# Patient Record
Sex: Male | Born: 1955 | Race: White | Hispanic: No | Marital: Married | Smoking: Former smoker
Health system: Southern US, Community
[De-identification: ages and names within clinical notes are randomized; demographics above are authoritative.]

## PROBLEM LIST (undated history)

## (undated) DIAGNOSIS — E785 Hyperlipidemia, unspecified: Secondary | ICD-10-CM

## (undated) DIAGNOSIS — G709 Myoneural disorder, unspecified: Secondary | ICD-10-CM

## (undated) DIAGNOSIS — T8859XA Other complications of anesthesia, initial encounter: Secondary | ICD-10-CM

## (undated) DIAGNOSIS — N182 Chronic kidney disease, stage 2 (mild): Secondary | ICD-10-CM

## (undated) DIAGNOSIS — R112 Nausea with vomiting, unspecified: Secondary | ICD-10-CM

## (undated) DIAGNOSIS — E559 Vitamin D deficiency, unspecified: Secondary | ICD-10-CM

## (undated) DIAGNOSIS — IMO0001 Reserved for inherently not codable concepts without codable children: Secondary | ICD-10-CM

## (undated) DIAGNOSIS — I6529 Occlusion and stenosis of unspecified carotid artery: Secondary | ICD-10-CM

## (undated) DIAGNOSIS — K219 Gastro-esophageal reflux disease without esophagitis: Secondary | ICD-10-CM

## (undated) DIAGNOSIS — D649 Anemia, unspecified: Secondary | ICD-10-CM

## (undated) DIAGNOSIS — G629 Polyneuropathy, unspecified: Secondary | ICD-10-CM

## (undated) DIAGNOSIS — I872 Venous insufficiency (chronic) (peripheral): Secondary | ICD-10-CM

## (undated) DIAGNOSIS — I951 Orthostatic hypotension: Secondary | ICD-10-CM

## (undated) DIAGNOSIS — F419 Anxiety disorder, unspecified: Secondary | ICD-10-CM

## (undated) DIAGNOSIS — E669 Obesity, unspecified: Secondary | ICD-10-CM

## (undated) DIAGNOSIS — I2510 Atherosclerotic heart disease of native coronary artery without angina pectoris: Secondary | ICD-10-CM

## (undated) DIAGNOSIS — E039 Hypothyroidism, unspecified: Secondary | ICD-10-CM

## (undated) DIAGNOSIS — F32A Depression, unspecified: Secondary | ICD-10-CM

## (undated) DIAGNOSIS — G4730 Sleep apnea, unspecified: Secondary | ICD-10-CM

## (undated) DIAGNOSIS — Z9889 Other specified postprocedural states: Secondary | ICD-10-CM

## (undated) DIAGNOSIS — I209 Angina pectoris, unspecified: Secondary | ICD-10-CM

## (undated) DIAGNOSIS — I10 Essential (primary) hypertension: Secondary | ICD-10-CM

## (undated) DIAGNOSIS — I272 Pulmonary hypertension, unspecified: Secondary | ICD-10-CM

## (undated) DIAGNOSIS — E1169 Type 2 diabetes mellitus with other specified complication: Secondary | ICD-10-CM

## (undated) DIAGNOSIS — N2889 Other specified disorders of kidney and ureter: Secondary | ICD-10-CM

## (undated) DIAGNOSIS — J449 Chronic obstructive pulmonary disease, unspecified: Secondary | ICD-10-CM

## (undated) DIAGNOSIS — F329 Major depressive disorder, single episode, unspecified: Secondary | ICD-10-CM

## (undated) DIAGNOSIS — T4145XA Adverse effect of unspecified anesthetic, initial encounter: Secondary | ICD-10-CM

## (undated) DIAGNOSIS — Z9861 Coronary angioplasty status: Secondary | ICD-10-CM

## (undated) HISTORY — DX: Obesity, unspecified: E66.9

## (undated) HISTORY — DX: Venous insufficiency (chronic) (peripheral): I87.2

## (undated) HISTORY — DX: Chronic obstructive pulmonary disease, unspecified: J44.9

## (undated) HISTORY — DX: Atherosclerotic heart disease of native coronary artery without angina pectoris: I25.10

## (undated) HISTORY — DX: Occlusion and stenosis of unspecified carotid artery: I65.29

## (undated) HISTORY — PX: HERNIA REPAIR: SHX51

## (undated) HISTORY — PX: BACK SURGERY: SHX140

## (undated) HISTORY — DX: Essential (primary) hypertension: I10

## (undated) HISTORY — DX: Atherosclerotic heart disease of native coronary artery without angina pectoris: Z98.61

## (undated) HISTORY — DX: Anxiety disorder, unspecified: F41.9

## (undated) HISTORY — PX: SPINE SURGERY: SHX786

## (undated) HISTORY — PX: OTHER SURGICAL HISTORY: SHX169

## (undated) HISTORY — DX: Reserved for inherently not codable concepts without codable children: IMO0001

---

## 1998-09-07 ENCOUNTER — Encounter: Admission: RE | Admit: 1998-09-07 | Discharge: 1998-12-06 | Payer: Self-pay | Admitting: Emergency Medicine

## 2001-12-11 HISTORY — PX: CARDIAC CATHETERIZATION: SHX172

## 2001-12-21 ENCOUNTER — Encounter: Payer: Self-pay | Admitting: *Deleted

## 2001-12-21 ENCOUNTER — Ambulatory Visit (HOSPITAL_COMMUNITY): Admission: RE | Admit: 2001-12-21 | Discharge: 2001-12-22 | Payer: Self-pay | Admitting: *Deleted

## 2001-12-21 HISTORY — PX: CORONARY ANGIOPLASTY: SHX604

## 2002-03-31 ENCOUNTER — Inpatient Hospital Stay (HOSPITAL_COMMUNITY): Admission: RE | Admit: 2002-03-31 | Discharge: 2002-04-02 | Payer: Self-pay | Admitting: Neurosurgery

## 2002-03-31 ENCOUNTER — Encounter: Payer: Self-pay | Admitting: Neurosurgery

## 2003-02-19 HISTORY — PX: CAROTID ENDARTERECTOMY: SUR193

## 2003-03-23 ENCOUNTER — Inpatient Hospital Stay (HOSPITAL_COMMUNITY): Admission: RE | Admit: 2003-03-23 | Discharge: 2003-03-24 | Payer: Self-pay | Admitting: Vascular Surgery

## 2003-03-23 ENCOUNTER — Encounter (INDEPENDENT_AMBULATORY_CARE_PROVIDER_SITE_OTHER): Payer: Self-pay | Admitting: *Deleted

## 2003-06-27 ENCOUNTER — Ambulatory Visit (HOSPITAL_COMMUNITY): Admission: RE | Admit: 2003-06-27 | Discharge: 2003-06-27 | Payer: Self-pay | Admitting: Neurosurgery

## 2003-06-29 ENCOUNTER — Ambulatory Visit (HOSPITAL_COMMUNITY): Admission: RE | Admit: 2003-06-29 | Discharge: 2003-06-29 | Payer: Self-pay | Admitting: Neurosurgery

## 2003-07-27 ENCOUNTER — Encounter: Admission: RE | Admit: 2003-07-27 | Discharge: 2003-07-27 | Payer: Self-pay | Admitting: Neurosurgery

## 2003-08-10 ENCOUNTER — Encounter: Admission: RE | Admit: 2003-08-10 | Discharge: 2003-08-10 | Payer: Self-pay | Admitting: Neurosurgery

## 2005-07-11 ENCOUNTER — Encounter: Admission: RE | Admit: 2005-07-11 | Discharge: 2005-07-11 | Payer: Self-pay | Admitting: Neurosurgery

## 2005-07-31 ENCOUNTER — Encounter: Admission: RE | Admit: 2005-07-31 | Discharge: 2005-07-31 | Payer: Self-pay | Admitting: Neurosurgery

## 2007-02-19 HISTORY — PX: UMBILICAL HERNIA REPAIR: SHX196

## 2007-06-17 ENCOUNTER — Ambulatory Visit: Payer: Self-pay | Admitting: Vascular Surgery

## 2007-08-27 ENCOUNTER — Ambulatory Visit (HOSPITAL_COMMUNITY): Admission: RE | Admit: 2007-08-27 | Discharge: 2007-08-27 | Payer: Self-pay | Admitting: General Surgery

## 2008-06-01 ENCOUNTER — Ambulatory Visit: Payer: Self-pay | Admitting: Vascular Surgery

## 2008-10-14 ENCOUNTER — Ambulatory Visit (HOSPITAL_COMMUNITY): Admission: RE | Admit: 2008-10-14 | Discharge: 2008-10-15 | Payer: Self-pay | Admitting: Neurosurgery

## 2009-01-02 ENCOUNTER — Inpatient Hospital Stay (HOSPITAL_COMMUNITY): Admission: RE | Admit: 2009-01-02 | Discharge: 2009-01-04 | Payer: Self-pay | Admitting: Neurosurgery

## 2009-06-01 ENCOUNTER — Ambulatory Visit: Payer: Self-pay | Admitting: Vascular Surgery

## 2010-05-23 LAB — CBC
HCT: 41.7 % (ref 39.0–52.0)
WBC: 4.7 10*3/uL (ref 4.0–10.5)

## 2010-05-23 LAB — GLUCOSE, CAPILLARY
Glucose-Capillary: 150 mg/dL — ABNORMAL HIGH (ref 70–99)
Glucose-Capillary: 163 mg/dL — ABNORMAL HIGH (ref 70–99)
Glucose-Capillary: 184 mg/dL — ABNORMAL HIGH (ref 70–99)
Glucose-Capillary: 194 mg/dL — ABNORMAL HIGH (ref 70–99)
Glucose-Capillary: 209 mg/dL — ABNORMAL HIGH (ref 70–99)
Glucose-Capillary: 240 mg/dL — ABNORMAL HIGH (ref 70–99)
Glucose-Capillary: 243 mg/dL — ABNORMAL HIGH (ref 70–99)
Glucose-Capillary: 225 mg/dL — ABNORMAL HIGH (ref 70–99)
Glucose-Capillary: 242 mg/dL — ABNORMAL HIGH (ref 70–99)

## 2010-05-23 LAB — TYPE AND SCREEN
ABO/RH(D): A POS
Antibody Screen: NEGATIVE

## 2010-05-23 LAB — BASIC METABOLIC PANEL
GFR calc non Af Amer: >60 mL/min (ref >60)
Glucose, Bld: 268 mg/dL — ABNORMAL HIGH (ref 70–99)

## 2010-05-24 ENCOUNTER — Other Ambulatory Visit: Payer: Self-pay

## 2010-05-26 LAB — GLUCOSE, CAPILLARY
Glucose-Capillary: 188 mg/dL — ABNORMAL HIGH (ref 70–99)
Glucose-Capillary: 208 mg/dL — ABNORMAL HIGH (ref 70–99)
Glucose-Capillary: 280 mg/dL — ABNORMAL HIGH (ref 70–99)

## 2010-05-26 LAB — COMPREHENSIVE METABOLIC PANEL
BUN: 19 mg/dL (ref 6–23)
CO2: 27 mEq/L (ref 19–32)
Calcium: 11.2 mg/dL — ABNORMAL HIGH (ref 8.4–10.5)
Creatinine, Ser: 1.08 mg/dL (ref 0.4–1.5)
GFR calc Af Amer: >60 mL/min (ref >60)
GFR calc non Af Amer: >60 mL/min (ref >60)
Glucose, Bld: 181 mg/dL — ABNORMAL HIGH (ref 70–99)

## 2010-05-26 LAB — CBC
Hemoglobin: 15.6 g/dL (ref 13.0–17.0)
MCHC: 35.1 g/dL (ref 30.0–36.0)
MCV: 91.1 fL (ref 78.0–100.0)
RBC: 4.88 MIL/uL (ref 4.22–5.81)

## 2010-06-05 ENCOUNTER — Other Ambulatory Visit: Payer: Self-pay

## 2010-07-03 NOTE — Op Note (Signed)
Harold [name], Harold [name]                 ACCOUNT NO.:  [id number]   MEDICAL RECORD NO.:  [id number]          PATIENT TYPE:  OIB   LOCATION:  3528                         FACILITY:  [facility]   PHYSICIAN:  Ashok Pall, M.D.     DATE OF BIRTH:  [birth year]   DATE OF PROCEDURE:  10/14/2008  DATE OF DISCHARGE:                               OPERATIVE REPORT   PREOPERATIVE DIAGNOSES:  1. Displaced disk, left L5-S1.  2. Left S1 radiculopathy.   POSTOPERATIVE DIAGNOSES:  1. Displaced disk, left L5-S1.  2. Left S1 radiculopathy.   PROCEDURE:  Left L5-S1 diskectomy with hemilaminectomy using  microdissection.   COMPLICATIONS:  None.   SURGEON:  Ashok Pall, MD   ASSISTANT:  Hosie Spangle, MD   ANESTHESIA:  General endotracheal.   INDICATIONS:  Harold [name] is a 55 year old who has had a several-month  history of severe pain in his left lower extremity.  MRI shows a fairly  large disk herniation at L5-S1 eccentric to the left side.  After going  through conservative treatment without relief, he has decided to opt for  operative decompression.   OPERATIVE NOTE:  Ms. [name] was brought to the operating room,  intubated, and placed under a general anesthetic without difficulty.  He  was rolled prone onto a Wilson frame and all pressure points were  properly padded.  His back was prepped, and he was draped in a sterile  fashion.  I infiltrated into the lower third of his lumbar incision.  I  opened that and took that down through the thoracolumbar fascia and  scar.  I then exposed 2 lamina and they turned out to be the L5-S1  lamina.  I then used a high-speed drill to perform a semi-  hemilaminectomy of L5 on the left side.  I removed the ligamentum flavum  and exposed the thecal sac between L5 and S1.  I brought the microscope  into the operative field and with Dr. Donnella Bi assistance, we  retracted thecal sac medially and encountered what was a very large disk  herniation.  I opened the  disk with a #15 blade and then Dr. Sherwood Gambler  removed it in piecemeal fashion initially.  I then used a hook to free  more loose disk material.  That was also removed.  Then eventually the  disk space which was deteriorated I removed.  I entered by opening with  a #15 blade and using pituitary rongeur along with some Epstein  curettes.  I removed all the loose disk material.  I inspected  underneath the S1 root on the left side and was not able to free any  more disk material nor did I believe there is any disk material  remaining.  I then irrigated.  I then started to close the wound.  Prior  to closure, I infiltrated another 20  mL of 0.5% lidocaine in paraspinous musculature and subcutaneous tissue.  I then closed the wound in layered fashion using Vicryl sutures to  reapproximate thoracolumbar, subcutaneous, subcuticular layers.  I used  Dermabond for sterile dressing.  ______________________________  Ashok Pall, M.D.     KC/MEDQ  D:  10/14/2008  T:  10/15/2008  Job:  [id number]

## 2010-07-03 NOTE — Procedures (Signed)
CAROTID DUPLEX EXAM   INDICATION:  Follow-up evaluation of known carotid artery disease.   HISTORY:  Diabetes:  Yes.  Cardiac:  PTCA in 11/2001.  Hypertension:  No.  Smoking:  No.  Previous Surgery:  Right carotid endarterectomy on 03/23/03 by Dr.  Oneida Alar.  CV History:  No.  Amaurosis Fugax No, Paresthesias No, Hemiparesis No.                                       RIGHT             LEFT  Brachial systolic pressure:         116               120  Brachial Doppler waveforms:         WNL               WNL  Vertebral direction of flow:        Antegrade         Antegrade  DUPLEX VELOCITIES (cm/sec)  CCA peak systolic                   115               393  ECA peak systolic                   193               594  ICA peak systolic                   105               090  ICA end diastolic                   38                59  PLAQUE MORPHOLOGY:                  Homogenous        Calcified  PLAQUE AMOUNT:                      Mild              Moderate  PLAQUE LOCATION:                    BIF, ICA, ECA     BIF, ICA, ECA   IMPRESSION:  1. 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  2. 40-59% left internal carotid artery stenosis.    ___________________________________________  Jessy Oto Fields, MD   AC/MEDQ  D:  06/01/2008  T:  06/01/2008  Job:  [id number]

## 2010-07-03 NOTE — Procedures (Signed)
CAROTID DUPLEX EXAM   INDICATION:  Follow-up evaluation of known carotid artery disease.   HISTORY:  Diabetes:  Orally controlled.  Cardiac:  PTCA in October, 2003.  Hypertension:  No.  Smoking:  No.  Previous Surgery:  Right carotid endarterectomy on 03/23/03 by Dr.  Oneida Alar.  CV History:  Previous duplex on 03/07/06 revealed no right ICA stenosis,  status post endarterectomy, and 40-59% left ICA stenosis.  Patient  reports no cerebrovascular symptoms at this time.  Amaurosis Fugax No, Paresthesias No, Hemiparesis No                                       RIGHT             LEFT  Brachial systolic pressure:         110               112  Brachial Doppler waveforms:         Triphasic         Triphasic  Vertebral direction of flow:        Antegrade         Antegrade  DUPLEX VELOCITIES (cm/sec)  CCA peak systolic                   81                224  ECA peak systolic                   127               825  ICA peak systolic                   75                003  ICA end diastolic                   33                45  PLAQUE MORPHOLOGY:                  Soft              Calcified  PLAQUE AMOUNT:                      Mild              Mild-to-moderate  PLAQUE LOCATION:                    Proximal ICA      Proximal ICA   IMPRESSION:  1. 20-39% right internal carotid artery stenosis, status post      endarterectomy.  2. 40-59% left internal carotid artery stenosis.  3. No significant change from previous study performed 03/07/06.   ___________________________________________  Harold Oto. Fields, MD   MC/MEDQ  D:  06/17/2007  T:  06/17/2007  Job:  [id number]

## 2010-07-06 NOTE — Op Note (Signed)
NAME:  Harold [name], Harold [name]                           ACCOUNT NO.:  [id number]   MEDICAL RECORD NO.:  [id number]                   PATIENT TYPE:  INP   LOCATION:  3309                                 FACILITY:  [facility]   PHYSICIAN:  Jessy Oto. Fields, MD               DATE OF BIRTH:  [birth year]   DATE OF PROCEDURE:  03/23/2003  DATE OF DISCHARGE:                                 OPERATIVE REPORT   PROCEDURE PERFORMED:  Right carotid endarterectomy.   PREOPERATIVE DIAGNOSIS:  Right carotid endarterectomy.   POSTOPERATIVE DIAGNOSIS:  Severe right internal carotid artery stenosis.   SURGEON:  Jessy Oto. Fields, MD   ASSISTANT:  Leta Baptist, PA   ANESTHESIA:  General.   INDICATIONS FOR PROCEDURE:  The patient is a 55 year old male with a history  of an asymptomatic severe greater than 90% right internal carotid artery  stenosis.   OPERATIVE FINDINGS:  1. Greater than 90% right internal carotid artery stenosis.  2. Dacron patch angioplasty.   DESCRIPTION OF PROCEDURE:  After obtaining informed consent, the patient was  taken to the operating room.  The patient was placed in supine position on  the operating table.  After induction of general anesthesia and endotracheal  intubation, a Foley catheter was placed.  Next, the patient's  entire right neck and chest were prepped and draped in the usual sterile  fashion.  An oblique incision was made across the anterior border of the  right sternocleidomastoid muscle.  This incision was carried down through  the subcutaneous tissues and down through the platysma using cautery  dissection.  The dissection proceeded along the anterior border of the  sternocleidomastoid muscle and this muscle and the internal jugular vein  were reflected laterally.  Next, the dissection proceeded down onto the  common carotid artery.  The carotid artery was dissected free  circumferentially and controlled with an umbilical tape.  Vagus was  identified and  protected from harm's way.  The ansa cervicalis was  identified crossing over the common carotid artery.  Next the dissection  proceeded up the artery and the internal carotid artery and external carotid  arteries were both dissected free circumferentially and controlled with  vessel loops.  The superior thyroid artery was also dissected free  circumferentially and controlled with the vessel loop.  The hypoglossal  nerve was identified at the superior most portion of the dissection and this  was protected from harm's way.  Next, the patient was given 5000 units of  intravenous heparin.  The internal carotid artery, external carotid artery,  superior thyroid artery and common carotid arteries were then clamped.  An  11 blade was used to make a longitudinal arteriotomy in the common carotid  artery.  Potts scissors were then used to extend the arteriotomy up into the  internal carotid artery and through the area of stenosis.  The area of  stenosis was heavily  calcified and greater than 90%.  Next, a 35 [place]  shunt was brought up in the operative field.  This was placed first in the  distal internal carotid artery, allowed to backbleed and then down into the  common carotid artery.  This shunt was then opened with restoration of flow  to the brain after approximately four minutes time.  Next, the  endarterectomy was begun in a suitable plane.  And the endarterectomy was  carried up to the distal internal carotid artery where a suitable end point  was obtained.  The endarterectomy was then carried up into the external and  superior thyroid arteries by eversion technique.  Plaque was then removed  and sent as a specimen.  Next, all loose debris was removed from the carotid  artery and this was thoroughly flushed with heparinized saline.  The end  point had a slight step off and was raising some with heparinized saline  flushing.  Therefore several 7-0 Prolene tacking sutures were placed on  the  end point to secure this.  Next, a Dacron patch was brought up into the  operative field and this was sewn at patch angioplasty onto the carotid  artery.  Just prior to completion of the anastomosis, the shunt was clamped  and removed.  The internal and external carotid arteries were thoroughly  back bled. These were then reclamped and the common carotid artery was  thoroughly fore bled.  These were all clamped and the patch was completed.  At completion of the patch the external carotid artery and superior thyroid  arteries were allowed to back bleed initially and then the common carotid  artery clamp was removed and the common carotid artery clamp was removed and  allowed to flush up the external carotid artery.  After approximately five  cardiac cycles, the internal carotid artery was reopened restoring blood  flow to the brain after approximately four minutes of ischemic time from  removal of the shunt.  Next, hemostasis was obtained.  Doppler was used to  inspect the distal internal carotid artery, the external carotid artery and  the common carotid artery and these all had normal Doppler signals.  Next,  the platysma was reapproximated using a running 3-0 Vicryl suture.  The skin  was then closed with a 4-0 Vicryl subcuticular stitch.  The patient  tolerated the procedure well.  There were no complications.  Sponge, needle  and instrument counts were correct at the end of the case.  The patient was  awakened in the operating room and had normal neurologic function with upper  and lower extremities symmetric motor strength.  The patient was transferred  to the recovery room in stable condition.                                               Jessy Oto. Fields, MD    CEF/MEDQ  D:  03/23/2003  T:  03/24/2003  Job:  [id number]

## 2010-07-06 NOTE — Cardiovascular Report (Signed)
NAME:  Harold [name], Harold [name]                           ACCOUNT NO.:  [id number]   MEDICAL RECORD NO.:  [id number]                   PATIENT TYPE:  OIB   LOCATION:  NA                                   FACILITY:  [facility]   PHYSICIAN:  Octavia Heir, M.D.             DATE OF BIRTH:  [birth year]   DATE OF PROCEDURE:  12/21/2001  DATE OF DISCHARGE:                              CARDIAC CATHETERIZATION   PROCEDURE:  1. Coronary angiography.  2. Left circumflex--mid; percutaneous transluminal coronary balloon     angioplasty.  3. Obtuse marginal #2--mid; unsuccessful percutaneous transluminal coronary     balloon angioplasty.  4. Left anterior descending artery--apical; unsuccessful percutaneous     transluminal coronary balloon angioplasty.   COMPLICATIONS:  None.   INDICATIONS FOR PROCEDURE:  The patient is a 55 year old male patient of Dr.  Nena Jordan with a history of noninsulin-dependent diabetes mellitus, a  history of tobacco abuse, who recently underwent a Cardiolite scan revealing  lateral wall ischemia.  He subsequently underwent a cardiac catheterization  on December 11, 2001, revealing 99% apical LAD lesion with a 70-80% mid AV  groove lesion and a subtotaled second OM.  He is now brought for staged  intervention of his OM.   DESCRIPTION OF PROCEDURE:  After giving informed written consent, the  patient was brought to the cardiac catheterization laboratory where his  right and left groin were shaved, prepped, and draped in the usual sterile  fashion.  ECG monitoring was established.  Using the modified Seldinger  technique, a #7 French arterial sheath was inserted in the right femoral  artery.  Next, a #7 [place] Voda catheter 3.5 with sideholes was then  coaxially engaged in the left coronary ostium.  Selective angiogram was then  performed.  Next, a 0.014 Forte guidewire was advanced out of the guiding  catheter and positioned in the proximal portion of the second OM.   Multiple  attempts were then made to cross the total occlusion; however, this was  unsuccessful.  This wire was then exchanged for a 0.014 PT Graphix wire;  however, we still were unsuccessful in crossing the lesion.  We then did try  a [facility] wire which was also unsuccessful in crossing the second  OM lesion.  We then attempted to cross the apical LAD lesion with the Forte  guidewire; however, this was also unsuccessful.  We ultimately did rewire  the AV groove circumflex and positioned in the third distal OM without  difficulty.  We then took a Maverick 2.0 x 20-mm balloon into the distal AV  groove and performed approximately six inflations throughout the distal and  mid AV groove to a maximum of approximately 12 atmospheres for a total of 7  minutes.  Followup angiogram revealed less than 10% residual stenosis in the  distal AV groove with approximately 30% stenosis in the mid AV groove.  IV  Integrilin was used throughout the case.  IV doses of heparin were given to  maintain an ACT between 200 and 300.   Final orthogonal angiograms reveal approximately 30% residual stenosis in  the mid AV groove with less than 10% residual in the distal AV groove.  At  this point, we elected to conclude the procedure.  All balloons, wires, and  catheters were removed.  The hemostatic sheaths were sewn in place and the  patient was transferred back to the ward in stable condition.   CONCLUSION:  1. Successful percutaneous transluminal coronary balloon angioplasty of the     distal and mid AV groove circumflex lesions.  2. Unsuccessful percutaneous transluminal coronary angioplasty of the second     obtuse marginal total occlusion.  3.     Unsuccessful percutaneous transluminal coronary angioplasty of the apical     left anterior descending artery total occlusion.  4. Adjunct use of Integrilin infusion.                                               Octavia Heir, M.D.     RHM/MEDQ  D:  12/21/2001  T:  12/21/2001  Job:  [id number]   cc:   Richardson Landry A. Everlene Farrier, M.D.  [address]  [place]  [place] [address]  Fax: [phone]

## 2010-07-06 NOTE — Discharge Summary (Signed)
NAME:  Harold [name], Harold [name]                           ACCOUNT NO.:  [id number]   MEDICAL RECORD NO.:  [id number]                   PATIENT TYPE:  INP   LOCATION:  3309                                 FACILITY:  [facility]   PHYSICIAN:  Jessy Oto. Fields, MD               DATE OF BIRTH:  02/17/1956   DATE OF ADMISSION:  03/23/2003  DATE OF DISCHARGE:  03/24/2003                                 DISCHARGE SUMMARY   DATE OF SURGERY:  March 23, 2003.   ADMITTING DIAGNOSIS:  Right carotid artery stenosis.   PAST MEDICAL HISTORY:  1. Coronary artery disease, status post angioplasty October 2003.  2. Hypertension.  3. Hyperlipidemia.  4. Diabetes mellitus, type 2 diagnosed 7 years ago.   SURGICAL HISTORY:  Includes extensive lumbar surgery of L4-L5 in February  2004.   ALLERGIES:  The patient is allergic to [facility] it causes rash and itching.   DISCHARGE DIAGNOSIS:  Right carotid artery stenosis, status post right  carotid endarterectomy.   BRIEF HISTORY:  Harold [name] is a 55 year old Caucasian male.  He was referred  to Dr. Oneida Alar for evaluation of an asymptomatic carotid bruit.  On February 23, 2003, Harold [name] underwent a carotid duplex exam at the [facility] office.  The  impression was of an 80-99% right internal carotid artery stenosis.  There  was no left internal carotid artery stenosis.  Harold [name] was evaluated by  Dr. Oneida Alar in the office on January 21.  After examination, the patient  reviewed the available records.  Dr. Oneida Alar felt that Harold [name] would  benefit from a right carotid endarterectomy for stroke prophylaxis.  The  procedure, risks and benefits of carotid endarterectomy were discussed and  Harold [name] agreed to proceed with surgery. He was scheduled as an elective  admission for February 2.   HOSPITAL COURSE:  On March 23, 2003 Harold [name] was electively admitted to  [facility] under the care of Dr. Murrell Converse.  He underwent the  following surgical procedure:   Right carotid endarterectomy with Dacron  patch angioplasty.  He tolerated this procedure well, transferring in stable  condition to the PACU.  He was extubated immediately following surgery.  He  awoke from anesthesia neurologically intact.  In the immediate postoperative  period he did require dopamine for blood pressure support, however, by the  early evening hours dopamine had been discontinued and his blood pressure  remained stable.  By the morning of postoperative day #1 Harold [name] was  without complaint.  Some postanesthesia nausea was resolved.  His vital  signs were stable.  He remained neurologically intact.  His right neck  incision was clean and dry.  There was no hematoma noted.  Harold [name] was  ready for discharge home the morning of March 24 2003.   CONDITION ON DISCHARGE:  Improved.   DISCHARGE MEDICATIONS:  1. He has been instructed  to resume his home medications of aspirin 325 mg     daily.  2. Mavik 2 mg daily.  3. Toprol XL 25 mg daily.  4. Lipitor 40 mg daily.  5. Avandamet 500 mg b.i.d.  6. Nexium 40 mg daily.  7. For pain management he can be on Tylox 1-2 p.o. q.4h. p.r.n. for moderate-     to-severe pain or Tylenol 325 mg 1-2 p.o. q.4h. p.r.n. for mild pain.   DISCHARGE INSTRUCTIONS:  1. Activity:  He has been asked to refrain from any driving for 2 weeks.  He     is also instructed to continue his breathing exercises and daily walking.  2. His diet should continue to be a heart healthy diet.  3. Wound Care:  He may begin showering starting March 25, 2003.  4. Follow up:  Dr. Oneida Alar would like to see him in CVTS follow up in 2     weeks.  The office will call for the date and time for that appointment.      Mardene Celeste, N.P.                  Jessy Oto. Fields, MD    CTK/MEDQ  D:  03/24/2003  T:  03/25/2003  Job:  [id number]   cc:   Jessy Oto. [place], [place], [place] [address]   Lina Sayre. Everlene Farrier, M.D.  [address]   [place]  [place] [address]  Fax: [phone]

## 2010-07-06 NOTE — Discharge Summary (Signed)
NAME:  Harold [name], Harold [name]                           ACCOUNT NO.:  [id number]   MEDICAL RECORD NO.:  [id number]                   PATIENT TYPE:  OIB   LOCATION:  9629                                 FACILITY:  [facility]   PHYSICIAN:  Octavia Heir, M.D.             DATE OF BIRTH:  November 04, 1955   DATE OF ADMISSION:  12/21/2001  DATE OF DISCHARGE:  12/22/2001                                 DISCHARGE SUMMARY   ADMISSION DIAGNOSES:  1. Coronary artery disease.     a. Status post outpatient cardiac catheterization on December 11, 2001,        revealing significant three-vessel coronary artery disease and normal        left ventricular function.     b. Status post exercise Cardiolite on September 16, 2001, revealing inferior        and inferolateral wall ischemia and ejection fraction of 60%.  2. Noninsulin-dependent diabetes mellitus.  3. Chronic tobacco use, ongoing.  4. Hypertension.   DISCHARGE DIAGNOSES:  1. Coronary artery disease.     a. Status post outpatient cardiac catheterization on December 11, 2001,        revealing significant three-vessel coronary artery disease and normal        left ventricular function.     b. Status post exercise Cardiolite on September 16, 2001, revealing inferior        and inferolateral wall ischemia and ejection fraction of 60%.  2. Noninsulin-dependent diabetes mellitus.  3. Chronic tobacco use, ongoing.  4. Hypertension.  5. Status post cardiac catheterization on December 21, 2001, with     percutaneous transluminal coronary angiography to the distal left     circumflex.   HISTORY OF PRESENT ILLNESS:  The patient is a 55 year old white male who had  a history of noninsulin-dependent diabetes mellitus and chronic tobacco use.  He underwent a treadmill Cardiolite which revealed an anterior wall scar and  inferolateral ischemia.  He then underwent outpatient cardiac  catheterization by Octavia Heir, M.D., on December 11, 2001, which  revealed a 99% apical  LAD, 70% mid AV stenosis, 99% proximal mid OM2 lesion,  and 70% mid PDA lesion.  At that time, it was felt that we should probably  plan for staged percutaneous intervention.   He came back to the office for follow-up on December 15, 2001, at which time  his groin site was healing well.  He had experienced some mild chest  discomfort, but had used no nitroglycerin and had no angina at rest.   At that time, medications were adjusted and he was to hold Glucophage in  preparation for catheterization.  As well, we added Plavix until his  catheterization and started him on Imdur 30 mg.  He will be scheduled for  cardiac catheterization and intervention.   HOSPITAL COURSE:  On December 21, 2001, the patient underwent cardiac  catheterization by Sherren Kerns.  Tami Ribas, M.D.  He was unable to cross the OM2  and apical LAD.  However, he did perform successful PTCA to the distal left  circumflex.  At that time, he planned to continue with Integrilin for 18  hours post procedure and would place for discharge home in the morning if  stable.   On the morning of December 22, 2001, the patient remained stable.  He had no  further chest pain and no complaint.  His right groin site is well healed  with no hematoma and no bleeding.  Good peripheral pulses.  At this time, he  was seen and evaluated by Octavia Heir, M.D., who deemed him stable for  discharge home.   DISCHARGE MEDICATIONS:  He will continue the current medications with no  change.   FOLLOW-UP:  He will follow up in the office at the end of this week as he  does have papers to complete for return to work.   HOSPITAL CONSULTS:  None.   HOSPITAL PROCEDURES:  Cardiac catheterization on December 21, 2001, by Octavia Heir, M.D.  Please see his dictated report for details.  This is not  available.  He was unable to cross the OM2 or apical LAD.  He did perform  successful PTCA to the distal left circumflex.  Again, see his dictated   report for further details as there is residual disease.  He planned to  continue Integrilin for 18 hours post procedure and planned for discharge  home the following morning if stable.  The patient tolerated the procedure  well without complications.   LABORATORY DATA:  Preprocedure labs had been ordered as an outpatient and  were reviewed and stable.  Post procedure on December 22, 2001, the sodium  was 135, potassium 3.7, BUN 10, creatinine 1.0, glucose 203, white count  9.2, hemoglobin 15.2, hematocrit 43.0, and platelets 143.   DISCHARGE MEDICATIONS:  1. Glucophage.  Do not take today or tomorrow.  Resume on Thursday, December 24, 2001, to previous dose.  2. Mavik.  He may resume today at the previous dose of 4 mg a day.  3. Lipitor 20 mg once a day.  4. Toprol XL 25 mg once day.  5. Imdur 30 mg once a day.  6. Enteric-coated aspirin 325 mg a day.  7. Nitroglycerin 0.4 mg sublingual as directed.   SPECIAL INSTRUCTIONS:  Stop smoking.  He and his friend say that they do  plan to stop smoking together in the next couple of weeks.   ACTIVITY:  No strenuous activity, lifting greater than 5 pounds, driving, or  sexual activity for three days.   DIET:  Low-cholesterol diet.   WOUND CARE:  May gently wash the groin site with warm water and soap.  Call  [phone] for any bleeding or increased pain in the groin site.    FOLLOW-UP:  Follow up on Friday, December 25, 2001, at 2 p.m. with Cyndia Bent, N.P., in the office, at which time she will follow him up post  catheterization, as well as complete paperwork for return to work.     Mary B. Easley, P.A.-C.                   Octavia Heir, M.D.    MBE/MEDQ  D:  12/22/2001  T:  12/23/2001  Job:  [id number]   cc:   [facility]   Lina Sayre. Everlene Farrier, M.D.  [phone]  [place]  [place] [address]  Fax: [phone]

## 2010-07-06 NOTE — H&P (Signed)
NAME:  Harold [name], Harold [name]                           ACCOUNT NO.:  [id number]   MEDICAL RECORD NO.:  [id number]                   PATIENT TYPE:  INP   LOCATION:  NA                                   FACILITY:  [facility]   PHYSICIAN:  Harold Oto. Fields, MD               DATE OF BIRTH:  30-Dec-1955   DATE OF ADMISSION:  03/23/2003  DATE OF DISCHARGE:                                HISTORY & PHYSICAL   CHIEF COMPLAINT:  Carotid artery disease.   HISTORY OF PRESENT ILLNESS:  This is a 55 year old male who was referred by  Dr. Everlene [name] for evaluation of carotid artery disease.  The patient had an  asymptomatic carotid bruit and underwent a carotid ultrasound which showed  an 80 to 99% right internal carotid artery stenosis.  The patient has been  asymptomatic since then, and was seen by Dr. Oneida [name] in clinic on March 11, 2003.  Dr. Oneida [name] recommended that the patient undergo a right carotid  endarterectomy to reduce the risk of stroke.  The risks, benefits, and  alternatives to the procedure were discussed with the patient at that time  and he agreed to proceed with surgery.  The patient presents today for a  preoperative evaluation.   Currently, the patient admits to symptoms only of blurred vision which is  intermittent, and he has only experienced in the past week or two.  The  patient also does have some tenderness on his right neck with deep  palpation.  Otherwise, the patient denies any headache, nausea, vomiting,  vertigo, dizziness, numbness, tingling, history of amaurosis fugax, syncope,  near syncope, speech impairment, dysphagia, memory loss, muscle weakness, or  confusion.  The patient also denies any fevers, chills, night sweats, cough,  or cold-like symptoms.   PAST MEDICAL HISTORY:  1. Carotid artery disease as stated above.  2. Diabetes mellitus type 2 for 7 years.  3. Hyperlipidemia.  4. Coronary artery disease, status post angioplasty in October 2003.  5. Hypertension.  6.  Status post extensive back surgery of L4-5 lumbar spine in February 2004.   MEDICATIONS:  1. Aspirin 325 mg p.o. daily.  2. Lipitor 40 mg p.o. daily.  3. Avandamet 500 mg p.o. b.i.d.  4. Nexium 40 mg p.o. daily.  5. Mavik 2 mg p.o. daily.  6. Toprol 25 mg p.o. daily.   ALLERGIES:  SEPTRA causes rash and itching.   REVIEW OF SYSTEMS:  Please see HPI for significant positives and negatives.  Otherwise, the patient denies any cardiac symptoms including chest pain,  palpitations, peripheral edema.  The patient also denies any pulmonary  symptoms;  specifically, pneumonia, cough, shortness of breath, or dyspnea  on exertion.  The patient also denies any GI symptoms, including diarrhea,  constipation, nausea, vomiting, hematochezia.  The patient also denies any  GU symptoms, including dysuria, frequency, hematuria.   FAMILY HISTORY:  The patient was adopted  at birth and has no history of his  family history.   SOCIAL HISTORY:  [name] is single and has no children.  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  He has a 35 pack year history  of smoking of which he wishes to quit after the planned procedure.  The  patient denies any alcohol use.   PHYSICAL EXAMINATION:  VITAL SIGNS:  Left arm blood pressure is 132/80,  pulse is 75 and regular, respirations are 14 and unlabored, weight is 222  pounds, height is 6 feet even.  GENERAL:  This is a very pleasant 55 year old white male who is in no acute  distress and is alert and oriented x3.  HEENT:  Harold [name], AT.  PERRLA.  EOMI.  Oropharynx is pink and moist without  exudates or oral sores.  NECK:  Supple without JVD or lymphadenopathy.  There is a right harsh  carotid bruit and no evidence of left carotid bruit.  CHEST:  Symmetrical unlabored respirations.  His lungs are clear to  auscultation throughout without wheezes, rhonchi, or rales.  CARDIAC:  Regular rate and rhythm without murmurs, rubs, or gallops.  Normal  S1 and  S2.  ABDOMEN:  Obese, soft, nontender, nondistended, with good bowel sounds.  No  evidence of organomegaly or masses or bruit.  GENITOURINARY:  Deferred.  RECTAL:  Deferred.  EXTREMITIES:  No cyanosis, clubbing, edema, or ulcerations.  Temperature is  warm and dry.  Peripheral pulses are as follows:  Carotid pulses 2+  bilaterally, femoral pulses 2+ bilaterally, posterior tibial pulses 2+  bilaterally.  NEUROLOGIC:  Cranial nerves II-XII intact.  His gait is steady.  His muscle  strength is equal and appropriate throughout with 5/5 strength.   ASSESSMENT AND PLAN:  This is a pleasant 55 year old male with severe right  carotid artery stenosis.  We will continue as planned with a right carotid  endarterectomy by Dr. Oneida [name] on March 23, 2003.  Dr. Oneida [name] has seen and  evaluated this patient prior to this admission and has explained the risks  and benefits of the procedure, and the patient has agreed to continue.      Harold [name].                  Harold Oto. Fields, MD    CAF/MEDQ  D:  03/21/2003  T:  03/21/2003  Job:  [id number]   cc:   Harold [name], M.D.  [address]  [place]  [place] [address]  Fax: [phone]

## 2010-07-06 NOTE — Op Note (Signed)
NAME:  Harold [name], OZMENT                           ACCOUNT NO.:  [id number]   MEDICAL RECORD NO.:  [id number]                   PATIENT TYPE:  INP   LOCATION:  3031                                 FACILITY:  [facility]   PHYSICIAN:  Ashok Pall, M.D.                  DATE OF BIRTH:  1955/11/10   DATE OF PROCEDURE:  03/31/2002  DATE OF DISCHARGE:                                 OPERATIVE REPORT   PREOPERATIVE DIAGNOSES:  1. Displaced cyst, right L4-5.  2. Degenerative disk L4-5.  3. Lumbar radiculopathy.   POSTOPERATIVE DIAGNOSES:  1. Displaced cyst,  L4-5 right.  2. Degenerative disk disease L4-5.  3. Lumbar radiculopathy.   HISTORY:  [name] is a 55 year old gentleman who presents with severe  pain in his right lower extremity. He has a herniated disk at L4-5.  Secondary to the marked degeneration at L4-5, he did not want to take a  chance that he might need further surgery so he decided he wanted to undergo  a fusion at L4-5. He is admitted today for that procedure.   PROCEDURE:  1. Posterior lumbar interbody fusion.  2. Allograft bone used for antibody arthrodesis.  3. Posterolateral arthrodesis L4-5.  4. Pedicle screw fixation L4-5.  5. Morcellized allograft and autograft for interbody and posterolateral     arthrodesis.   COMPLICATIONS:  None.   SURGEON:  Ashok Pall, M.D.   ASSISTANT:  Marchia Meiers. Vertell Limber, M.D.   ANESTHESIA:  General endotracheal.   DESCRIPTION OF PROCEDURE:  [name] was brought to the operating room,  intubated, placed under general anesthesia without difficulty. A Foley  catheter was placed under sterile conditions. He was then rolled prone onto  body rolls and all pressure points were padded. His back was prepped and he  was draped in a sterile fashion. I made a skin incision after taking a  preoperative localizer with a #10 blade and took this down to the  thoracolumbar fascia. I then exposed the lamina of L3-4, L5 and the sacrum  bilaterally. I performed my space L4-5. I then proceed with the laminectomy  at L4-5 and diskectomy. This was done and I removed the inferior facet on  the left side and drilled away a significant portion of the inferior facet  of L4 on the right side. I was then able to perform a very good diskectomy  and removed all disk material. I used the disk space shaver provided by  [employer] and the [employer] instrumentation. I eventually placed two 11 mm  grafts into the disk space after first removing the endplates and removing  all the disk material. I placed morcellized autograft into the disk space  also. I then turned my attention to placing pedicle screws at L4-5. I  identified the pedicles at L5 on the side. I then turned my attention to the  L4 pedicle. This was markedly sclerotic  and there was some difficulty  penetrating the pedicle but with fluoroscopic guidance and AP and lateral  views, I was able to place a 40 mm 6.5 screw there as I had at L5 on the  right side. I then went to the left side and placed two screws so difficulty  first by drilling tapping and then using fluoroscopic guidance. The 40 mm  rods were placed. Dr. Vertell Limber assisted with the arthrodesis at L4-5  posterolaterally. We placed bone graft after decorticating the transverse  processes of L4-5 bilaterally. We placed this construct in some compression.  I then placed morcellized allograft into the space, it was a chromous  synthetic material. I then closed the wound in layered fashion using Vicryl  sutures. Dermabond used for a sterile dressing. The patient tolerated the  procedure well.                                               Ashok Pall, M.D.    KC/MEDQ  D:  03/31/2002  T:  03/31/2002  Job:  [id number]

## 2010-07-09 ENCOUNTER — Other Ambulatory Visit: Payer: Self-pay

## 2010-10-03 ENCOUNTER — Other Ambulatory Visit (INDEPENDENT_AMBULATORY_CARE_PROVIDER_SITE_OTHER): Payer: BC Managed Care – PPO

## 2010-10-03 DIAGNOSIS — I6529 Occlusion and stenosis of unspecified carotid artery: Secondary | ICD-10-CM

## 2010-10-19 NOTE — Procedures (Unsigned)
CAROTID DUPLEX EXAM  INDICATION:  Follow up known carotid artery disease.  HISTORY: Diabetes:  Yes. Cardiac:  PTCA, 11/2001. Hypertension:  Yes. Smoking:  No. Previous Surgery:  Right carotid endarterectomy, 03/23/2003, by Dr. Oneida Alar. CV History:  No. Amaurosis Fugax No, Paresthesias No, Hemiparesis No.                                      RIGHT             LEFT Brachial systolic pressure:         112               117 Brachial Doppler waveforms:         Normal            Normal Vertebral direction of flow:        Antegrade         Antegrade DUPLEX VELOCITIES (cm/sec) CCA peak systolic                   90                096 ECA peak systolic                   134               283 ICA peak systolic                   83                662 ICA end diastolic                   19                44 PLAQUE MORPHOLOGY:                  Homogenous        Calcific PLAQUE AMOUNT:                      Mild              Moderate PLAQUE LOCATION:                    Bifurcation, ICA, ECA               Bifurcation, ICA, ECA  IMPRESSION: 1. Right internal carotid artery velocities suggest 1% to 39%     stenosis. 2. Left internal carotid artery velocities suggest 40% to 59%     stenosis. 3. Antegrade vertebral arteries bilaterally. 4. Stable in comparison to the previous examination.  ___________________________________________ Jessy Oto Fields, MD  EM/MEDQ  D:  10/03/2010  T:  10/03/2010  Job:  [id number]

## 2010-11-15 LAB — DIFFERENTIAL
Eosinophils Absolute: 0.2
Eosinophils Relative: 3
Lymphs Abs: 2.3
Monocytes Absolute: 0.5

## 2010-11-15 LAB — CBC
HCT: 43.7
Hemoglobin: 15.3
MCV: 89.4
Platelets: 168
RDW: 12.5
WBC: 6.6

## 2010-11-15 LAB — BASIC METABOLIC PANEL
BUN: 19
Chloride: 102
Glucose, Bld: 259 — ABNORMAL HIGH
Potassium: 4.5

## 2011-03-27 ENCOUNTER — Telehealth: Payer: Self-pay

## 2011-03-27 NOTE — Telephone Encounter (Signed)
Message received at 9:35 pm; please ask pt what his BS was this am 03/28/11?  How is he feeling?  Please ask pt to tell you exactly what medications he is taking for his sugar.

## 2011-03-27 NOTE — Telephone Encounter (Signed)
.  [facility] PT IS CONCERNED WITH HIS SUGAR LEVELS   THIS AM IT WAS 274 NOON DOWN TO 71  THINKS HE MAY BE TOO LOW.  PLEASE CALL

## 2011-03-28 NOTE — Telephone Encounter (Signed)
Tried to call pt. No answer/no VM at home, work # stated "pt is at home".

## 2011-03-29 NOTE — Telephone Encounter (Signed)
Pt reports that several days last 2-3 weeks, BS will be high fasting AM, as usual, 215-274. Then early afternoon will drop to 70s and he feels "hot,shakey, lightheaded, out of it, and occasional involuntary jerks". Then it is usually around 5:00 before he can get BS back to 150 or above. He is currently taking 70 units Lantus BID and 20 units Humalog BID. He didn't know if maybe that he is using CPAP, if he doesn't need as much insulin, or if maybe needs adjusted to take more in evening and less in morning, or just less in morning. He just doesn't like it dropping so low. Yesterday BS was better - 259 fasting, then only dropped to 143 at 1:00. He had eaten some toast with breakfast and it stayed higher. D/W pt importance of spreading out carb intake during the day. Pt verbalized understanding. Pt states he will see Dr Everlene Farrier in March.

## 2011-03-30 NOTE — Telephone Encounter (Signed)
[place] please call Harold [name] and advised him to decrease his morning Humalog 2-4 units and see if this would decrease his episodes of hypoglycemia in the afternoon. If he sees he continues to have trouble with hypoglycemia he just needs to back off some on his morning insulin. Tell him to let us know up he continues to have problems thank you

## 2011-04-01 NOTE — Telephone Encounter (Signed)
Spoke with pt and gave instructions from Dr Everlene Farrier. Pt agreed.

## 2011-04-20 ENCOUNTER — Ambulatory Visit (INDEPENDENT_AMBULATORY_CARE_PROVIDER_SITE_OTHER): Payer: BC Managed Care – PPO | Admitting: Emergency Medicine

## 2011-04-20 VITALS — BP 122/70 | HR 57 | Temp 97.2°F | Resp 18 | Ht 74.5 in | Wt 290.6 lb

## 2011-04-20 DIAGNOSIS — J329 Chronic sinusitis, unspecified: Secondary | ICD-10-CM

## 2011-04-20 DIAGNOSIS — R05 Cough: Secondary | ICD-10-CM

## 2011-04-20 DIAGNOSIS — J029 Acute pharyngitis, unspecified: Secondary | ICD-10-CM

## 2011-04-20 DIAGNOSIS — J019 Acute sinusitis, unspecified: Secondary | ICD-10-CM

## 2011-04-20 LAB — POCT RAPID STREP A (OFFICE): Rapid Strep A Screen: NEGATIVE

## 2011-04-20 MED ORDER — BENZONATATE 100 MG PO CAPS: 200.0000 mg | ORAL_CAPSULE | Freq: Three times a day (TID) | ORAL | Status: AC | PRN

## 2011-04-20 MED ORDER — AZITHROMYCIN 250 MG PO TABS: ORAL_TABLET | ORAL | Status: AC

## 2011-04-20 NOTE — Progress Notes (Signed)
Patient ID: Harold [name], male   DOB: [birth year], 56 y.o.   MRN: [id number]

## 2011-04-20 NOTE — Progress Notes (Deleted)
  Subjective:    Patient ID: Harold [name], male    DOB: [birth year], 56 y.o.   MRN: [id number]  HPI    Review of Systems     Objective:   Physical Exam        Assessment & Plan:

## 2011-04-20 NOTE — Progress Notes (Signed)
Subjective:  Harold [name] is a 56 y.o. male that is here today with a chief complaint of head congestion and purulent nasal drainage and onset of illness was Wednesday afternoon when he started to have a burning stinging sensation in his nose. This was followed by a sore throat with difficulty swallowing. He has had a cough which has been productive of small amounts of thick colored mucus.  Objective:  Filed Vitals:   04/20/11 1504  BP: 122/70  Pulse: 57  Temp: 97.2 F (36.2 C)  Resp: 18    General Appearance: Alert operative male in no acute distress.  HEENT: TMs are clear nose is congested throat is red  Neck: The neck is supple without adenopathy  Chest: Clear to auscultation and  Heart: Regular rate no murmurs rubs or gout  Abdomen: Soft liver and spleen not enlarged   Results for orders placed during the hospital encounter of 01/02/09  GLUCOSE, CAPILLARY      Component Value Range   Glucose-Capillary 243 (*) 70 - 99 (mg/dL)  GLUCOSE, CAPILLARY      Component Value Range   Glucose-Capillary 240 (*) 70 - 99 (mg/dL)  GLUCOSE, CAPILLARY      Component Value Range   Glucose-Capillary 209 (*) 70 - 99 (mg/dL)  GLUCOSE, CAPILLARY      Component Value Range   Glucose-Capillary 150 (*) 70 - 99 (mg/dL)  GLUCOSE, CAPILLARY      Component Value Range   Glucose-Capillary 184 (*) 70 - 99 (mg/dL)  GLUCOSE, CAPILLARY      Component Value Range   Glucose-Capillary 163 (*) 70 - 99 (mg/dL)  GLUCOSE, CAPILLARY      Component Value Range   Glucose-Capillary 112 (*) 70 - 99 (mg/dL)  GLUCOSE, CAPILLARY      Component Value Range   Glucose-Capillary 194 (*) 70 - 99 (mg/dL)   Comment 1 Notify RN    BASIC METABOLIC PANEL      Component Value Range   Sodium 134 (*) 135 - 145 (mEq/L)   Potassium 4.7 SLIGHT HEMOLYSIS  3.5 - 5.1 (mEq/L)   Chloride 100  96 - 112 (mEq/L)   CO2 26  19 - 32 (mEq/L)   Glucose, Bld 268 (*) 70 - 99 (mg/dL)   BUN 12  6 - 23 (mg/dL)   Creatinine, Ser 1.00   0.4 - 1.5 (mg/dL)   Calcium 10.5  8.4 - 10.5 (mg/dL)   GFR calc non Af Amer >60  >60 (mL/min)   GFR calc Af Amer    >60 (mL/min)   Value: >60            The eGFR has been calculated     using the MDRD equation.     This calculation has not been     validated in all clinical     situations.     eGFR's persistently     <60 mL/min signify     possible Chronic Kidney Disease.  CBC      Component Value Range   WBC 4.7  4.0 - 10.5 (K/uL)   RBC 4.52  4.22 - 5.81 (MIL/uL)   Hemoglobin 14.7  13.0 - 17.0 (g/dL)   HCT 41.7  39.0 - 52.0 (%)   MCV 92.3  78.0 - 100.0 (fL)   MCHC 35.3  30.0 - 36.0 (g/dL)   RDW 12.9  11.5 - 15.5 (%)   Platelets 159  150 - 400 (K/uL)  GLUCOSE, CAPILLARY  Component Value Range   Glucose-Capillary 242 (*) 70 - 99 (mg/dL)   Comment 1 Notify RN     Comment 2 Documented in Chart    TYPE AND SCREEN      Component Value Range   ABO/RH(D) A POS     Antibody Screen NEG     Sample Expiration 01/05/2009    ABO/RH      Component Value Range   ABO/RH(D) A POS    GLUCOSE, CAPILLARY      Component Value Range   Glucose-Capillary 225 (*) 70 - 99 (mg/dL)  GLUCOSE, CAPILLARY      Component Value Range   Glucose-Capillary 223 (*) 70 - 99 (mg/dL)     Assessment:  Assessment is upper respiratory infection with secondary sinusitis. Patient has underlying diabetes.  Plan:  Check a strep test and if negative proceed with antibiotics and sinus care.

## 2011-04-29 ENCOUNTER — Other Ambulatory Visit: Payer: Self-pay | Admitting: Dermatology

## 2011-04-29 ENCOUNTER — Ambulatory Visit (INDEPENDENT_AMBULATORY_CARE_PROVIDER_SITE_OTHER): Payer: BC Managed Care – PPO | Admitting: Emergency Medicine

## 2011-04-29 DIAGNOSIS — G589 Mononeuropathy, unspecified: Secondary | ICD-10-CM

## 2011-04-29 DIAGNOSIS — E785 Hyperlipidemia, unspecified: Secondary | ICD-10-CM

## 2011-04-29 DIAGNOSIS — E1149 Type 2 diabetes mellitus with other diabetic neurological complication: Secondary | ICD-10-CM | POA: Insufficient documentation

## 2011-04-29 DIAGNOSIS — F32A Depression, unspecified: Secondary | ICD-10-CM

## 2011-04-29 DIAGNOSIS — E1169 Type 2 diabetes mellitus with other specified complication: Secondary | ICD-10-CM | POA: Insufficient documentation

## 2011-04-29 DIAGNOSIS — G629 Polyneuropathy, unspecified: Secondary | ICD-10-CM

## 2011-04-29 DIAGNOSIS — E119 Type 2 diabetes mellitus without complications: Secondary | ICD-10-CM

## 2011-04-29 DIAGNOSIS — I779 Disorder of arteries and arterioles, unspecified: Secondary | ICD-10-CM

## 2011-04-29 DIAGNOSIS — F329 Major depressive disorder, single episode, unspecified: Secondary | ICD-10-CM | POA: Insufficient documentation

## 2011-04-29 DIAGNOSIS — R609 Edema, unspecified: Secondary | ICD-10-CM

## 2011-04-29 DIAGNOSIS — I10 Essential (primary) hypertension: Secondary | ICD-10-CM

## 2011-04-29 MED ORDER — ESCITALOPRAM OXALATE 20 MG PO TABS: 20.0000 mg | ORAL_TABLET | Freq: Every day | ORAL | Status: DC

## 2011-04-29 MED ORDER — ZOLPIDEM TARTRATE 10 MG PO TABS: 10.0000 mg | ORAL_TABLET | Freq: Every evening | ORAL | Status: DC | PRN

## 2011-04-29 MED ORDER — GABAPENTIN 600 MG PO TABS: 1200.0000 mg | ORAL_TABLET | Freq: Once | ORAL | Status: DC

## 2011-04-29 MED ORDER — INSULIN ASPART 100 UNIT/ML ~~LOC~~ SOLN: SUBCUTANEOUS | Status: DC

## 2011-04-29 MED ORDER — SPIRONOLACTONE 25 MG PO TABS: 25.0000 mg | ORAL_TABLET | Freq: Two times a day (BID) | ORAL | Status: DC

## 2011-04-29 MED ORDER — EXENATIDE 5 MCG/0.02ML ~~LOC~~ SOPN: 5.0000 ug | PEN_INJECTOR | Freq: Two times a day (BID) | SUBCUTANEOUS | Status: DC

## 2011-04-29 NOTE — Progress Notes (Signed)
  Subjective:    Patient ID: Harold [name], male    DOB: 11/26/1955, 56 y.o.   MRN: [id number]  HPI patient and her because his insurance carrier has decided he needs to change to the medications he is on. Is currently on Humalog and has changed to NovoLog. Currently on the toes and has to change to Byetta. He would like to increase his Lexapro to 20 mg since he's been under a lot of stress caring for his mother who is in a care facility in [place]. She is currently under hospice care and is not expected to live much longer period    Review of Systems patient's last hemoglobin A1c was improved overall he is doing better. With control of his diabetes.     Objective:   Physical Exam  Constitutional: He is oriented to person, place, and time. He appears well-developed.  HENT:       Scar right side of his neck from previous carotid endarterectomy  Eyes: Pupils are equal, round, and reactive to light.  Neck: No JVD present. No tracheal deviation present. No thyromegaly present.  Cardiovascular: Normal rate and regular rhythm.  Exam reveals no gallop and no friction rub.   No murmur heard. Abdominal: Soft.  Lymphadenopathy:    He has no cervical adenopathy.  Neurological: He is alert and oriented to person, place, and time.          Assessment & Plan:  The patient's meds were reviewed. Changes are going to have to be made to to his insurance coverage. We'll need to change Humalog to NovoLog. We will need to change the toes to Byetta. We'll refill his spiral-like tone and gabapentin. His Lexapro dose was increased to 20 mg a day.

## 2011-04-30 ENCOUNTER — Encounter: Payer: Self-pay | Admitting: Emergency Medicine

## 2011-05-28 ENCOUNTER — Ambulatory Visit (INDEPENDENT_AMBULATORY_CARE_PROVIDER_SITE_OTHER): Payer: BC Managed Care – PPO | Admitting: Emergency Medicine

## 2011-05-28 DIAGNOSIS — M79606 Pain in leg, unspecified: Secondary | ICD-10-CM

## 2011-05-28 DIAGNOSIS — F329 Major depressive disorder, single episode, unspecified: Secondary | ICD-10-CM

## 2011-05-28 DIAGNOSIS — Z0000 Encounter for general adult medical examination without abnormal findings: Secondary | ICD-10-CM

## 2011-05-28 DIAGNOSIS — G619 Inflammatory polyneuropathy, unspecified: Secondary | ICD-10-CM

## 2011-05-28 DIAGNOSIS — E119 Type 2 diabetes mellitus without complications: Secondary | ICD-10-CM

## 2011-05-28 LAB — POCT URINALYSIS DIPSTICK
Bilirubin, UA: NEGATIVE
Glucose, UA: 500
Spec Grav, UA: 1.02
Urobilinogen, UA: 0.2

## 2011-05-28 LAB — POCT UA - MICROSCOPIC ONLY
Bacteria, U Microscopic: NEGATIVE
Casts, Ur, LPF, POC: NEGATIVE
WBC, Ur, HPF, POC: NEGATIVE

## 2011-05-28 LAB — IFOBT (OCCULT BLOOD): IFOBT: NEGATIVE

## 2011-05-28 NOTE — Progress Notes (Signed)
  Subjective:    Patient ID: Harold [name], male    DOB: 07-28-1955, 56 y.o.   MRN: [id number]  HPI patient enters for his annual checkup he has multiple medical problems which include arteriosclerotic cardiovascular disease carotid disease severe diabetes mellitus with peripheral neuropathy he has had a difficult time finding the correct person to oversee his diabetes. He went and saw a endocrinologist in [place] embolus on happy with his response there. He would like referral to a different endocrinologist    Review of Systems  Constitutional: Positive for unexpected weight change.  HENT: Negative.   Eyes:       He has an eye doctor which she exercise regularly.  Respiratory: Negative.   Cardiovascular: Positive for leg swelling.  Gastrointestinal: Negative.   Genitourinary: Negative.   Musculoskeletal: Negative.   Neurological:       He has numbness and tingling in both of his legs. His chronic pain with discomfort into his buttocks and legs which he is unsure as related to his back or to his neuropathy.  Hematological: Negative.   Psychiatric/Behavioral: Positive for dysphoric mood.       Objective:   Physical Exam  Constitutional: He is oriented to person, place, and time. He appears well-developed and well-nourished.  HENT:  Head: Normocephalic.  Right Ear: External ear normal.  Left Ear: External ear normal.  Eyes: Pupils are equal, round, and reactive to light.  Neck: Neck supple. No JVD present. No tracheal deviation present. No thyromegaly present.  Cardiovascular: Normal rate and regular rhythm.  Exam reveals no gallop and no friction rub.   No murmur heard. Pulmonary/Chest: No respiratory distress. He has no wheezes. He has no rales. He exhibits no tenderness.  Abdominal: Soft. He exhibits no distension and no mass. There is no tenderness. There is no rebound and no guarding.  Genitourinary: Prostate normal.  Musculoskeletal: He exhibits no edema and no tenderness.   Lymphadenopathy:    He has no cervical adenopathy.  Neurological: He is alert and oriented to person, place, and time.       He has decreased sensation of both lower legs from the knees down. Consistent with his peripheral neuropathy  Skin:       He has varicosities with skin changes of both legs dorsalis pedis pulses are 2+          Assessment & Plan:   Patient stable on current medications. He is very concerned about his persistent weight gain which is an issue. His social situation is improved with his mother now receiving hospice care in [place]. We'll go ahead and refer to Dr. Chalmers Cater for evaluation.

## 2011-05-29 ENCOUNTER — Encounter: Payer: Self-pay | Admitting: *Deleted

## 2011-05-29 LAB — MICROALBUMIN / CREATININE URINE RATIO
Creatinine, Urine: 62.5 mg/dL
Microalb, Ur: 0.66 mg/dL (ref 0.00–1.89)

## 2011-05-29 LAB — PSA: PSA: 0.32 ng/mL (ref <=4.00)

## 2011-06-03 ENCOUNTER — Telehealth: Payer: Self-pay

## 2011-06-03 DIAGNOSIS — IMO0001 Reserved for inherently not codable concepts without codable children: Secondary | ICD-10-CM

## 2011-06-03 NOTE — Telephone Encounter (Signed)
Pt was expecting a referal to dr Porfirio Oar office says he has been waiting since last august for this appt

## 2011-06-04 NOTE — Telephone Encounter (Signed)
Please go ahead and send referral to Dr. Chalmers Cater for help with his diabetes .

## 2011-06-04 NOTE — Telephone Encounter (Signed)
Ordered

## 2011-06-04 NOTE — Telephone Encounter (Signed)
Dr. Everlene Farrier, ok to refer to Dr. Chalmers Cater?

## 2011-07-01 ENCOUNTER — Other Ambulatory Visit: Payer: Self-pay | Admitting: Emergency Medicine

## 2011-08-01 ENCOUNTER — Other Ambulatory Visit: Payer: Self-pay | Admitting: Physician Assistant

## 2011-09-03 ENCOUNTER — Other Ambulatory Visit: Payer: Self-pay | Admitting: Physician Assistant

## 2011-09-05 ENCOUNTER — Ambulatory Visit (INDEPENDENT_AMBULATORY_CARE_PROVIDER_SITE_OTHER): Payer: BC Managed Care – PPO | Admitting: Emergency Medicine

## 2011-09-05 VITALS — BP 146/68 | HR 69 | Temp 97.7°F | Resp 20 | Ht 73.0 in | Wt 313.0 lb

## 2011-09-05 DIAGNOSIS — S91009A Unspecified open wound, unspecified ankle, initial encounter: Secondary | ICD-10-CM

## 2011-09-05 DIAGNOSIS — R609 Edema, unspecified: Secondary | ICD-10-CM

## 2011-09-05 DIAGNOSIS — M7989 Other specified soft tissue disorders: Secondary | ICD-10-CM

## 2011-09-05 DIAGNOSIS — L97909 Non-pressure chronic ulcer of unspecified part of unspecified lower leg with unspecified severity: Secondary | ICD-10-CM

## 2011-09-05 DIAGNOSIS — S81009A Unspecified open wound, unspecified knee, initial encounter: Secondary | ICD-10-CM

## 2011-09-05 DIAGNOSIS — S81801A Unspecified open wound, right lower leg, initial encounter: Secondary | ICD-10-CM

## 2011-09-05 MED ORDER — DOXYCYCLINE HYCLATE 100 MG PO TABS: 100.0000 mg | ORAL_TABLET | Freq: Two times a day (BID) | ORAL | Status: DC

## 2011-09-05 MED ORDER — MUPIROCIN CALCIUM 2 % EX CREA: TOPICAL_CREAM | Freq: Three times a day (TID) | CUTANEOUS | Status: DC

## 2011-09-05 MED ORDER — FUROSEMIDE 40 MG PO TABS: 40.0000 mg | ORAL_TABLET | Freq: Every day | ORAL | Status: DC

## 2011-09-05 NOTE — Patient Instructions (Addendum)
. Please stay off your feet keep yourEdema Edema is an abnormal build-up of fluids in tissues. Because this is partly dependent on gravity (water flows to the lowest place), it is more common in the leg sand thighs (lower extremities). It is also common in the looser tissues, like around the eyes. Painless swelling of the feet and ankles is common and increases as a person ages. It may affect both legs and may include the calves or even thighs. When squeezed, the fluid may move out of the affected area and may leave a dent for a few moments. CAUSES   Prolonged standing or sitting in one place for extended periods of time. Movement helps pump tissue fluid into the veins, and absence of movement prevents this, resulting in edema.   Varicose veins. The valves in the veins do not work as well as they should. This causes fluid to leak into the tissues.   Fluid and salt overload.   Injury, burn, or surgery to the leg, ankle, or foot, may damage veins and allow fluid to leak out.   Sunburn damages vessels. Leaky vessels allow fluid to go out into the sunburned tissues.   Allergies (from insect bites or stings, medications or chemicals) cause swelling by allowing vessels to become leaky.   Protein in the blood helps keep fluid in your vessels. Low protein, as in malnutrition, allows fluid to leak out.   Hormonal changes, including pregnancy and menstruation, cause fluid retention. This fluid may leak out of vessels and cause edema.   Medications that cause fluid retention. Examples are sex hormones, blood pressure medications, steroid treatment, or anti-depressants.   Some illnesses cause edema, especially heart failure, kidney disease, or liver disease.   Surgery that cuts veins or lymph nodes, such as surgery done for the heart or for breast cancer, may result in edema.  DIAGNOSIS  Your caregiver is usually easily able to determine what is causing your swelling (edema) by simply asking what is  wrong (getting a history) and examining you (doing a physical). Sometimes x-rays, EKG (electrocardiogram or heart tracing), and blood work may be done to evaluate for underlying medical illness. TREATMENT  General treatment includes:  Leg elevation (or elevation of the affected body part).   Restriction of fluid intake.   Prevention of fluid overload.   Compression of the affected body part. Compression with elastic bandages or support stockings squeezes the tissues, preventing fluid from entering and forcing it back into the blood vessels.   Diuretics (also called water pills or fluid pills) pull fluid out of your body in the form of increased urination. These are effective in reducing the swelling, but can have side effects and must be used only under your caregiver's supervision. Diuretics are appropriate only for some types of edema.  The specific treatment can be directed at any underlying causes discovered. Heart, liver, or kidney disease should be treated appropriately. HOME CARE INSTRUCTIONS   Elevate the legs (or affected body part) above the level of the heart, while lying down.   Avoid sitting or standing still for prolonged periods of time.   Avoid putting anything directly under the knees when lying down, and do not wear constricting clothing or garters on the upper legs.   Exercising the legs causes the fluid to work back into the veins and lymphatic channels. This may help the swelling go down.   The pressure applied by elastic bandages or support stockings can help reduce ankle swelling.  A low-salt diet may help reduce fluid retention and decrease the ankle swelling.   Take any medications exactly as prescribed.  SEEK MEDICAL CARE IF:  Your edema is not responding to recommended treatments. SEEK IMMEDIATE MEDICAL CARE IF:   You develop shortness of breath or chest pain.   You cannot breathe when you lay down; or if, while lying down, you have to get up and go to  the window to get your breath.   You are having increasing swelling without relief from treatment.   You develop a fever over 102 F (38.9 C).   You develop pain or redness in the areas that are swollen.   Tell your caregiver right away if you have gained 3 lb/1.4 kg in 1 day or 5 lb/2.3 kg in a week.  MAKE SURE YOU:   Understand these instructions.   Will watch your condition.   Will get help right away if you are not doing well or get worse.  Document Released: 02/04/2005 Document Revised: 01/24/2011 Document Reviewed: 09/23/2007 [facility] Patient Information 2012 [employer], [place]. legs elevated decrease your salt intake and recheck on Monday

## 2011-09-06 ENCOUNTER — Encounter (INDEPENDENT_AMBULATORY_CARE_PROVIDER_SITE_OTHER): Payer: BC Managed Care – PPO

## 2011-09-06 DIAGNOSIS — M7989 Other specified soft tissue disorders: Secondary | ICD-10-CM

## 2011-09-06 NOTE — Progress Notes (Signed)
  Subjective:    Patient ID: Harold [name], male    DOB: [birth year], 56 y.o.   MRN: [id number]  HPI patient presents with chronic swelling of his lower extremities right greater than left. He consulted Dr. Fernande Boyden who started him on Lasix 40 mg a day. Despite this he has had progressive swelling especially involving the right leg and has developed an open wound over the anterior surface of the right shin.    Review of Systems     Objective:   Physical Exam there is a 2 x 1 cm ulcerated area over the right anterior shin there is mild redness around the borders. He has tight edema of the lower extremity but no popliteal pain        Assessment & Plan:  We'll schedule an ultrasound for tomorrow to be sure he does not have a DVT. I started him on Bactroban and doxycycline antibiotics. A culture was done of the area. At the present time I have left his  diuretic dosage the same. He is on Aldactone and Lasix.

## 2011-09-06 NOTE — Procedures (Unsigned)
DUPLEX DEEP VENOUS EXAM - LOWER EXTREMITY  INDICATION:  The patient presents with right lower extremity swelling and erythema.  He states that he has a history of bilateral lower extremity edema which is usually relieved with diuretics.  He also states that he was instructed to elevate his right lower extremity yesterday and the edema  was relieved.  HISTORY:  Edema:  Right lower extremity Trauma/Surgery:  Yes.  The patient states that he "scraped" his leg on a brick approximately 3 weeks ago. Pain:  Right lower extremity PE:  No Previous DVT:  No Anticoagulants:  ASA Other:  DUPLEX EXAM:               CFV   SFV   PopV  PTV    GSV               R  L  R  L  R  L  R   L  R  L Thrombosis    o  o  o     o     o      o Spontaneous   +  +  +     +     +      + Phasic        +  +  +     +     +      + Augmentation  +  +  +     +     +      + Compressible  +  +  +     +     +      + Competent     +     +     +            o  Legend:  + - yes  o - no  p - partial  D - decreased  IMPRESSION: 1. No evidence of right lower extremity deep venous thrombosis or     superficial thrombophlebitis . 2. The right saphenofemoral junction is competent. 3. The great saphenous vein is incompetent at the knee and in the     calf.   _____________________________ Conrad Catoosa, MD  CI/MEDQ  D:  09/06/2011  T:  09/06/2011  Job:  [id number]

## 2011-09-08 ENCOUNTER — Telehealth: Payer: Self-pay | Admitting: Emergency Medicine

## 2011-09-08 LAB — WOUND CULTURE: Gram Stain: NONE SEEN

## 2011-09-08 NOTE — Telephone Encounter (Signed)
Please call doppler normal

## 2011-09-08 NOTE — Telephone Encounter (Signed)
Patient notified and will recheck Wednesday.

## 2011-09-11 ENCOUNTER — Ambulatory Visit (INDEPENDENT_AMBULATORY_CARE_PROVIDER_SITE_OTHER): Payer: BC Managed Care – PPO | Admitting: Emergency Medicine

## 2011-09-11 ENCOUNTER — Encounter: Payer: Self-pay | Admitting: Emergency Medicine

## 2011-09-11 VITALS — BP 108/62 | HR 55 | Temp 97.4°F | Resp 16 | Ht 73.25 in | Wt 309.4 lb

## 2011-09-11 DIAGNOSIS — L97909 Non-pressure chronic ulcer of unspecified part of unspecified lower leg with unspecified severity: Secondary | ICD-10-CM

## 2011-09-11 DIAGNOSIS — R609 Edema, unspecified: Secondary | ICD-10-CM

## 2011-09-11 DIAGNOSIS — M7989 Other specified soft tissue disorders: Secondary | ICD-10-CM

## 2011-09-11 LAB — BASIC METABOLIC PANEL
BUN: 29 mg/dL — ABNORMAL HIGH (ref 6–23)
CO2: 29 mEq/L (ref 19–32)
Chloride: 99 mEq/L (ref 96–112)
Creat: 1.4 mg/dL — ABNORMAL HIGH (ref 0.50–1.35)
Potassium: 4.8 mEq/L (ref 3.5–5.3)

## 2011-09-11 MED ORDER — FUROSEMIDE 40 MG PO TABS: 40.0000 mg | ORAL_TABLET | Freq: Every day | ORAL | Status: DC

## 2011-09-11 MED ORDER — MUPIROCIN CALCIUM 2 % EX CREA: TOPICAL_CREAM | Freq: Three times a day (TID) | CUTANEOUS | Status: AC

## 2011-09-11 MED ORDER — DOXYCYCLINE HYCLATE 100 MG PO TABS: 100.0000 mg | ORAL_TABLET | Freq: Two times a day (BID) | ORAL | Status: AC

## 2011-09-11 NOTE — Progress Notes (Signed)
  Subjective:    Patient ID: Harold [name], male    DOB: 1955/11/23, 56 y.o.   MRN: [id number]  HPI check stasis ulcer of his right leg. The area is starting to heal he does have a lot of pain in the area of his ulcer . Culture grew staph species. Doppler study done did not reveal a clot he continues on Lasix 40 mg a day he has been using Bactroban cream followed by Telfa and a dressing    Review of Systems     Objective:   Physical Exam is a 2 x 1 cm shallow ulcer over the right anterior leg. Surrounding stasis changes. There is 1+ edema on the right leg trace of the left leg the        Assessment & Plan:  Patient here with improvement in the ulcer of his right leg. We'll continue Bactroban and doxycycline for now they will use other cream on the dry skin he has surrounding this area. Then a note to be out of work until next Tuesday. He has an appointment at the wound center on Friday.

## 2011-09-13 ENCOUNTER — Encounter (HOSPITAL_BASED_OUTPATIENT_CLINIC_OR_DEPARTMENT_OTHER): Payer: BC Managed Care – PPO | Attending: General Surgery

## 2011-09-13 ENCOUNTER — Encounter: Payer: Self-pay | Admitting: Emergency Medicine

## 2011-09-13 DIAGNOSIS — Z7982 Long term (current) use of aspirin: Secondary | ICD-10-CM | POA: Insufficient documentation

## 2011-09-13 DIAGNOSIS — G589 Mononeuropathy, unspecified: Secondary | ICD-10-CM | POA: Insufficient documentation

## 2011-09-13 DIAGNOSIS — Z79899 Other long term (current) drug therapy: Secondary | ICD-10-CM | POA: Insufficient documentation

## 2011-09-13 DIAGNOSIS — F3289 Other specified depressive episodes: Secondary | ICD-10-CM | POA: Insufficient documentation

## 2011-09-13 DIAGNOSIS — E039 Hypothyroidism, unspecified: Secondary | ICD-10-CM | POA: Insufficient documentation

## 2011-09-13 DIAGNOSIS — F329 Major depressive disorder, single episode, unspecified: Secondary | ICD-10-CM | POA: Insufficient documentation

## 2011-09-13 DIAGNOSIS — L97809 Non-pressure chronic ulcer of other part of unspecified lower leg with unspecified severity: Secondary | ICD-10-CM | POA: Insufficient documentation

## 2011-09-13 DIAGNOSIS — I872 Venous insufficiency (chronic) (peripheral): Secondary | ICD-10-CM | POA: Insufficient documentation

## 2011-09-13 DIAGNOSIS — E119 Type 2 diabetes mellitus without complications: Secondary | ICD-10-CM | POA: Insufficient documentation

## 2011-09-13 DIAGNOSIS — I10 Essential (primary) hypertension: Secondary | ICD-10-CM | POA: Insufficient documentation

## 2011-09-13 NOTE — Progress Notes (Signed)
[facility]  NAME:  Harold [name], Harold [name] NO.:  [id number]  MEDICAL RECORD NO.:  [id number]      DATE OF BIRTH:  [birth year]  PHYSICIAN:  Judene Companion, M.D.           VISIT DATE:                                  OFFICE VISIT   This is a very obese 56 year old gentleman who has a long history of venous stasis, and he enters with about a 1-cm ulcer of his right leg. This man is a diabetic.  He is hypothyroid.  He is hypertensive.  His blood pressure is 140/90.  His temperature is 98.6.  He is on many medications including Synthroid for his hypothyroidism, NovoLog for his diabetes, gabapentin for his neuropathy.  He is on Lexapro for depression, Ambien.  He is also on simvastatin, metformin, aspirin atenolol, and lisinopril.  He has had many surgeries before, laminectomy and metal rods in his back.  He has also had neck surgery, and he has had an endarterectomy and his right carotid.  He has had angioplasties. He has an umbilical herniorrhaphy.  He has many medical problems, but today, he is here for treatment of this venous ulcer, which is one of the least of problems that he has.  Today, I debrided it and cleaned it up, and we put an Unna boot on with silver alginate on top of this ulcer that is about 1 cm in diameter.  I think this will heal very easily with Unna boots and collagen and maybe even if need be an Oasis, but he is going to see a Vascular man to see if he needs any venous ablation that has already been lined up.  We will see him back here in a week.     Judene Companion, M.D.     PP/MEDQ  D:  09/13/2011  T:  09/13/2011  Job:  [id number]

## 2011-09-20 ENCOUNTER — Encounter (HOSPITAL_BASED_OUTPATIENT_CLINIC_OR_DEPARTMENT_OTHER): Payer: BC Managed Care – PPO | Attending: General Surgery

## 2011-09-20 DIAGNOSIS — I872 Venous insufficiency (chronic) (peripheral): Secondary | ICD-10-CM | POA: Insufficient documentation

## 2011-09-20 DIAGNOSIS — L97809 Non-pressure chronic ulcer of other part of unspecified lower leg with unspecified severity: Secondary | ICD-10-CM | POA: Insufficient documentation

## 2011-09-27 ENCOUNTER — Ambulatory Visit (INDEPENDENT_AMBULATORY_CARE_PROVIDER_SITE_OTHER): Payer: BC Managed Care – PPO | Admitting: Emergency Medicine

## 2011-09-27 ENCOUNTER — Ambulatory Visit: Payer: BC Managed Care – PPO

## 2011-09-27 VITALS — BP 109/61 | HR 56 | Temp 97.9°F | Resp 16 | Ht 73.5 in | Wt 312.0 lb

## 2011-09-27 DIAGNOSIS — M79641 Pain in right hand: Secondary | ICD-10-CM

## 2011-09-27 DIAGNOSIS — G5790 Unspecified mononeuropathy of unspecified lower limb: Secondary | ICD-10-CM

## 2011-09-27 DIAGNOSIS — L97909 Non-pressure chronic ulcer of unspecified part of unspecified lower leg with unspecified severity: Secondary | ICD-10-CM

## 2011-09-27 DIAGNOSIS — G629 Polyneuropathy, unspecified: Secondary | ICD-10-CM

## 2011-09-27 DIAGNOSIS — M79609 Pain in unspecified limb: Secondary | ICD-10-CM

## 2011-09-27 DIAGNOSIS — E119 Type 2 diabetes mellitus without complications: Secondary | ICD-10-CM

## 2011-09-27 NOTE — Progress Notes (Signed)
  Subjective:    Patient ID: Harold [name], male    DOB: 18-Mar-1955, 56 y.o.   MRN: [id number]  HPI  Patient here for follow up has suffered recent loss, mother passed away. Had been under care of hospice for 21 months does not feel quite so stressed, reports feeling somewhat melancholy but otherwise okay. He is scheduled to return to work soon and ready to move on with life and get back to usual routine. Complains of right hand pain lateral/ at 4th and 5th metacarpals. Xray ordered today. Patient has decreased lasix to one daily and has done well with this. He has upcoming appointment with endocrinology, Harold [name] will have labs checked with this appointment.  patient is having increased pain and numbness in lower legs does not recall recent Arterial studies.   Review of Systems     Objective:   Physical Exam Decreased ankle reflexes secondary to neuropathy from diabetes, knee reflexes normal and symmetric no ankle reflexes. Patient states he has increasing discomfort in his lower extremities when he tries to walk. He has had venous Dopplers done of the lower extremities but to his knowledge he has never had arterial studies done.  There is tenderness over the MCP joints of the fifth and second fingers. There is mild puffiness to the second MCP and fifth MCP.  [facility] reading (PRIMARY) by  HaroldDaubThere is calcification of the ulnar styloid consistent with an old ulnar styloid fracture.        Assessment & Plan:   He is doing well with his diabetes. His ulcer of the right leg is healing. He is being released by the wound Center. He is down to taking his Lasix once a day. He's going to have her been done 2 replacements next week and have them fax me the results. X-rays of his hand are normal he was soaked his hands and work on range of motion. Because of his complaints of pain in his calves and legs with ambulation we'll go ahead and check arterial Dopplers of the lower extremities. He is seeing Harold.  Oneida [name] in the past so we'll get the Dopplers done to that office to

## 2011-10-01 ENCOUNTER — Other Ambulatory Visit: Payer: Self-pay

## 2011-10-01 DIAGNOSIS — I6529 Occlusion and stenosis of unspecified carotid artery: Secondary | ICD-10-CM

## 2011-10-01 DIAGNOSIS — I70219 Atherosclerosis of native arteries of extremities with intermittent claudication, unspecified extremity: Secondary | ICD-10-CM

## 2011-10-11 ENCOUNTER — Encounter (HOSPITAL_BASED_OUTPATIENT_CLINIC_OR_DEPARTMENT_OTHER): Payer: BC Managed Care – PPO

## 2011-10-18 ENCOUNTER — Ambulatory Visit (INDEPENDENT_AMBULATORY_CARE_PROVIDER_SITE_OTHER): Payer: BC Managed Care – PPO | Admitting: Emergency Medicine

## 2011-10-18 VITALS — BP 124/74 | HR 55 | Temp 97.6°F | Resp 17 | Ht 73.5 in | Wt 315.0 lb

## 2011-10-18 DIAGNOSIS — E119 Type 2 diabetes mellitus without complications: Secondary | ICD-10-CM

## 2011-10-18 DIAGNOSIS — L2089 Other atopic dermatitis: Secondary | ICD-10-CM

## 2011-10-18 DIAGNOSIS — L255 Unspecified contact dermatitis due to plants, except food: Secondary | ICD-10-CM

## 2011-10-18 MED ORDER — BETAMETHASONE DIPROPIONATE AUG 0.05 % EX CREA: TOPICAL_CREAM | Freq: Two times a day (BID) | CUTANEOUS | Status: DC

## 2011-10-18 MED ORDER — PREDNISONE 10 MG PO TABS: ORAL_TABLET | ORAL | Status: DC

## 2011-10-18 NOTE — Patient Instructions (Addendum)
Poison [employer] ivy is a inflammation of the skin (contact dermatitis) caused by touching the allergens on the leaves of the ivy plant following previous exposure to the plant. The rash usually appears 48 hours after exposure. The rash is usually bumps (papules) or blisters (vesicles) in a linear pattern. Depending on your own sensitivity, the rash may simply cause redness and itching, or it may also progress to blisters which may break open. These must be well cared for to prevent secondary bacterial (germ) infection, followed by scarring. Keep any open areas dry, clean, dressed, and covered with an antibacterial ointment if needed. The eyes may also get puffy. The puffiness is worst in the morning and gets better as the day progresses. This dermatitis usually heals without scarring, within 2 to 3 weeks without treatment. HOME CARE INSTRUCTIONS  Thoroughly wash with soap and water as soon as you have been exposed to poison ivy. You have about one half hour to remove the plant resin before it will cause the rash. This washing will destroy the oil or antigen on the skin that is causing, or will cause, the rash. Be sure to wash under your fingernails as any plant resin there will continue to spread the rash. Do not rub skin vigorously when washing affected area. Poison ivy cannot spread if no oil from the plant remains on your body. A rash that has progressed to weeping sores will not spread the rash unless you have not washed thoroughly. It is also important to wash any clothes you have been wearing as these may carry active allergens. The rash will return if you wear the unwashed clothing, even several days later. Avoidance of the plant in the future is the best measure. Poison ivy plant can be recognized by the number of leaves. Generally, poison ivy has three leaves with flowering branches on a single stem. Diphenhydramine may be purchased over the counter and used as needed for itching. Do not drive with  this medication if it makes you drowsy.Ask your caregiver about medication for children. SEEK MEDICAL CARE IF:  Open sores develop.   Redness spreads beyond area of rash.   You notice purulent (pus-like) discharge.   You have increased pain.   Other signs of infection develop (such as fever).  Document Released: 02/02/2000 Document Revised: 01/24/2011 Document Reviewed: 12/21/2008 [facility] Patient Information 2012 [place].

## 2011-10-18 NOTE — Progress Notes (Signed)
  Subjective:    Patient ID: Harold [name], male    DOB: 04-Oct-1955, 56 y.o.   MRN: [id number]  HPI Patient presents today with a rash on face and legs. He was in the yard working on Sunday. Tuesday the rash started to break out. His face is real itchy on right chin area. His right leg rash started coming up yesterday and is not itching. Today there are a couple area on the left leg. Pertinent also to this case is that the patient has poorly controlled diabetes. He as multiple complications related to his diabetes. His sugars have been under mildly better control recently.    Review of Systems     Objective:   Physical Exam the right side of the neck shows raised red vesicular area along the right anterior cervical chain. Examination lower extremities reveal 2+ edema. He has underlying stasis changes bilaterally. Over the proximal lower legs are raised red bumps present bilaterally.        Assessment & Plan:  This appears to be a contact dermatitis most likely secondary to poison ivy. We'll need to use a low dose of steroids due to his diabetes. He was alerted to keep a close watch on this. He was also given a prescription for Diprolene cream to apply to the areas twice daily .

## 2011-10-21 LAB — WOUND CULTURE
Gram Stain: NONE SEEN
Gram Stain: NONE SEEN

## 2011-10-23 ENCOUNTER — Other Ambulatory Visit: Payer: BC Managed Care – PPO

## 2011-11-06 ENCOUNTER — Encounter: Payer: Self-pay | Admitting: Vascular Surgery

## 2011-11-07 ENCOUNTER — Ambulatory Visit: Payer: BC Managed Care – PPO | Admitting: Neurosurgery

## 2011-11-07 ENCOUNTER — Ambulatory Visit (INDEPENDENT_AMBULATORY_CARE_PROVIDER_SITE_OTHER): Payer: BC Managed Care – PPO | Admitting: Vascular Surgery

## 2011-11-07 ENCOUNTER — Encounter: Payer: Self-pay | Admitting: Vascular Surgery

## 2011-11-07 ENCOUNTER — Other Ambulatory Visit: Payer: Self-pay

## 2011-11-07 ENCOUNTER — Other Ambulatory Visit (INDEPENDENT_AMBULATORY_CARE_PROVIDER_SITE_OTHER): Payer: BC Managed Care – PPO | Admitting: *Deleted

## 2011-11-07 ENCOUNTER — Encounter (INDEPENDENT_AMBULATORY_CARE_PROVIDER_SITE_OTHER): Payer: BC Managed Care – PPO | Admitting: *Deleted

## 2011-11-07 VITALS — BP 119/58 | HR 52 | Resp 16 | Ht 73.5 in | Wt 310.0 lb

## 2011-11-07 DIAGNOSIS — Z48812 Encounter for surgical aftercare following surgery on the circulatory system: Secondary | ICD-10-CM

## 2011-11-07 DIAGNOSIS — M79609 Pain in unspecified limb: Secondary | ICD-10-CM

## 2011-11-07 DIAGNOSIS — I70219 Atherosclerosis of native arteries of extremities with intermittent claudication, unspecified extremity: Secondary | ICD-10-CM

## 2011-11-07 DIAGNOSIS — I6529 Occlusion and stenosis of unspecified carotid artery: Secondary | ICD-10-CM

## 2011-11-07 DIAGNOSIS — I6523 Occlusion and stenosis of bilateral carotid arteries: Secondary | ICD-10-CM | POA: Insufficient documentation

## 2011-11-07 NOTE — Progress Notes (Signed)
[facility] [place] HISTORY AND PHYSICAL   History of Present Illness:  Patient is a 56 y.o. year old male who presents for evaluation of a moderate carotid stenosis as well as bilateral lower extremity pain.  The patient previously had a right carotid endarterectomy in 2005 by me and has done well from this. Other medical problems include diabetes which is currently controlled. The patient states that he has not had any symptoms of TIA amaurosis or stroke. He currently develops pain in both calves after walking approximately 40 yards. This resolved fairly quickly with rest. However, he has gained significant weight as he is not able to be very active secondary to his leg pain. He has chronic numbness and tingling in both feet which is been present for approximately 3 months. He denies rest pain. He has no nonhealing ulcers.  Past Medical History  Diagnosis Date  . Diabetes mellitus   . Peripheral vascular disease   . Carotid artery occlusion     Past Surgical History  Procedure Date  . Back surgery 2004    X2-2010  . Umbilical hernia repair 1610    steel mesh insert     Social History History  Substance Use Topics  . Smoking status: Former Smoker    Quit date: 03/23/2003  . Smokeless tobacco: Never Used  . Alcohol Use: No    Family History History reviewed. No pertinent family history.  Allergies  Allergies  Allergen Reactions  . Septra (Bactrim) Itching  . Penicillins Hives    Allergy to All cillin drugs     Current Outpatient Prescriptions  Medication Sig Dispense Refill  . ALPRAZolam (XANAX) 0.5 MG tablet Take 0.5 mg by mouth at bedtime as needed.      Marland Kitchen aspirin 325 MG tablet Take 325 mg by mouth daily.      Marland Kitchen atenolol (TENORMIN) 25 MG tablet Take 25 mg by mouth daily.      . cholecalciferol (VITAMIN D) 1000 UNITS tablet Take 1,000 Units by mouth 4 (four) times daily.      Marland Kitchen escitalopram (LEXAPRO) 20 MG tablet Take 1 tablet (20 mg total) by  mouth daily.  30 tablet  12  . esomeprazole (NEXIUM) 40 MG capsule Take 40 mg by mouth 1 day or 1 dose.      . felodipine (PLENDIL) 5 MG 24 hr tablet TAKE 1 TABLET BY MOUTH DAILY  15 tablet  0  . fenofibrate micronized (LOFIBRA) 134 MG capsule Take 1 capsule (134 mg total) by mouth daily before breakfast.  15 capsule  0  . fish oil-omega-3 fatty acids 1000 MG capsule Take 1,200 mg by mouth 3 (three) times daily.      Marland Kitchen gabapentin (NEURONTIN) 600 MG tablet Take 2 tablets (1,200 mg total) by mouth once. 679m once in am,once midday,12071mat night  150 tablet  12  . Garlic 50960G CAPS Take 500 mg by mouth daily.      . insulin aspart (NOVOLOG) 100 UNIT/ML injection Patient is to take 45 units prior to each meal which is 3 times a day.      . insulin NPH (HUMULIN N,NOVOLIN N) 100 UNIT/ML injection Inject 75 Units into the skin 3 (three) times daily before meals.      . Marland Kitchenevothyroxine (SYNTHROID, LEVOTHROID) 75 MCG tablet Take 75 mcg by mouth daily.      . Marland Kitchenisinopril (PRINIVIL,ZESTRIL) 20 MG tablet Take 20 mg by mouth daily.      .Marland Kitchen  metFORMIN (GLUCOPHAGE) 1000 MG tablet Take 1,000 mg by mouth 2 (two) times daily with a meal.      . Misc Natural Products (LUTEIN 20 PO) Take 20 mg by mouth daily.      . Multiple Vitamins-Minerals (MULTIVITAMIN WITH MINERALS) tablet Take 1 tablet by mouth daily.      . Potassium 99 MG TABS Take 99 mg by mouth daily.      . pregabalin (LYRICA) 75 MG capsule Take 225 mg by mouth. Pt takes 1 pill QAM and 2 pills QHS      . simvastatin (ZOCOR) 40 MG tablet Take 40 mg by mouth every evening.      . zolpidem (AMBIEN) 10 MG tablet Take 10 mg by mouth at bedtime.      Marland Kitchen augmented betamethasone dipropionate (DIPROLENE AF) 0.05 % cream Apply topically 2 (two) times daily.  50 g  0  . furosemide (LASIX) 40 MG tablet Take 1 tablet (40 mg total) by mouth daily.  30 tablet  5  . predniSONE (DELTASONE) 10 MG tablet Take 4 a day for 3 days 3 a day for 3 days 2 a day for 3 days one a day  for 3 days .  30 tablet  0  . zolpidem (AMBIEN) 10 MG tablet Take 1 tablet (10 mg total) by mouth at bedtime as needed for sleep.  30 tablet  5  . DISCONTD: insulin lispro (HUMALOG) 100 UNIT/ML injection Inject 25 Units into the skin 3 (three) times daily before meals.        ROS:   General:  No weight loss, Fever, chills  HEENT: No recent headaches, no nasal bleeding, no visual changes, no sore throat  Neurologic: No dizziness, blackouts, seizures. No recent symptoms of stroke or mini- stroke. No recent episodes of slurred speech, or temporary blindness.  Cardiac: No recent episodes of chest pain/pressure, no shortness of breath at rest.  No shortness of breath with exertion.  Denies history of atrial fibrillation or irregular heartbeat  Vascular: No history of rest pain in feet.  No history of claudication.  No history of non-healing ulcer, No history of DVT   Pulmonary: No home oxygen, no productive cough, no hemoptysis,  No asthma or wheezing  Musculoskeletal:  _0  Arthritis, _1  Low back pain,  _2  Joint pain  Hematologic:No history of hypercoagulable state.  No history of easy bleeding.  No history of anemia  Gastrointestinal: No hematochezia or melena,  No gastroesophageal reflux, no trouble swallowing  Urinary: _3  chronic Kidney disease, _4  on HD - _5  MWF or _6  TTHS, _7  Burning with urination, _8  Frequent urination, _9  Difficulty urinating;   Skin: No rashes  Psychological: No history of anxiety,  No history of depression   Physical Examination  Filed Vitals:   11/07/11 1022 11/07/11 1023  BP:  119/58  Pulse:  52  Resp: 16   Height: 6' 1.5" (1.867 m)   Weight: 310 lb (140.615 kg)   SpO2: 98%     Body mass index is 40.34 kg/(m^2).  General:  Alert and oriented, no acute distress HEENT: Normal Neck: No bruit or JVD Pulmonary: Clear to auscultation bilaterally Cardiac: Regular Rate and Rhythm without murmur Abdomen: Soft, non-tender, non-distended, no  mass, periumbilical scar, obese Skin: No rash Extremity Pulses:  2+ radial, brachial, 1+femoral, absent dorsalis pedis, posterior tibial pulses bilaterally Musculoskeletal: No deformity right leg slightly more edema than left  Neurologic: Upper  and lower extremity motor 5/5 and symmetric  DATA: Patient had a bilateral carotid duplex scan today which I reviewed and interpreted. This shows a 40-60% left internal carotid artery stenosis which is unchanged from 2012. He has no significant stenosis on the right side. He had bilateral ABIs performed today which were 0.91 on the right 1.09 on the left there were triphasic waveforms but decreased toe pressures bilaterally   ASSESSMENT: Bilateral lower extremity symptoms were somewhat claudication. However the patient does have fairly normal ABIs. This may be a stenosis that is only present on demand. I believe the best option for him would be arteriogram lower extremity runoff for further evaluation of his arterial tree. We will also consider an intervention if there is a lesion amenable to this at the time of his arteriogram. Risks benefits possible complications and procedure details were explained the patient today he understands and agrees to proceed. He is scheduled for September 27.  Ruta Hinds, MD [facility] [place] Office: [phone] Pager: [phone]

## 2011-11-08 ENCOUNTER — Encounter (HOSPITAL_COMMUNITY): Payer: Self-pay | Admitting: Pharmacy Technician

## 2011-11-13 ENCOUNTER — Ambulatory Visit (INDEPENDENT_AMBULATORY_CARE_PROVIDER_SITE_OTHER): Payer: BC Managed Care – PPO | Admitting: Emergency Medicine

## 2011-11-13 VITALS — BP 138/72 | HR 59 | Temp 97.8°F | Resp 20 | Ht 73.5 in | Wt 312.0 lb

## 2011-11-13 DIAGNOSIS — R109 Unspecified abdominal pain: Secondary | ICD-10-CM

## 2011-11-13 DIAGNOSIS — K4090 Unilateral inguinal hernia, without obstruction or gangrene, not specified as recurrent: Secondary | ICD-10-CM

## 2011-11-13 DIAGNOSIS — E669 Obesity, unspecified: Secondary | ICD-10-CM

## 2011-11-13 DIAGNOSIS — E1169 Type 2 diabetes mellitus with other specified complication: Secondary | ICD-10-CM

## 2011-11-13 DIAGNOSIS — K529 Noninfective gastroenteritis and colitis, unspecified: Secondary | ICD-10-CM

## 2011-11-13 DIAGNOSIS — E119 Type 2 diabetes mellitus without complications: Secondary | ICD-10-CM

## 2011-11-13 LAB — POCT CBC
MCH, POC: 30.2 pg (ref 27–31.2)
MCV: 93.7 fL (ref 80–97)
MID (cbc): 0.5 (ref 0–0.9)
POC LYMPH PERCENT: 34.4 %L (ref 10–50)
Platelet Count, POC: 148 10*3/uL (ref 142–424)
RBC: 4.86 M/uL (ref 4.69–6.13)
RDW, POC: 13.6 %
WBC: 6.4 10*3/uL (ref 4.6–10.2)

## 2011-11-13 LAB — POCT UA - MICROSCOPIC ONLY
Bacteria, U Microscopic: NEGATIVE
Mucus, UA: NEGATIVE

## 2011-11-13 LAB — POCT URINALYSIS DIPSTICK
Blood, UA: NEGATIVE
Glucose, UA: NEGATIVE
Ketones, UA: NEGATIVE
Spec Grav, UA: 1.025
Urobilinogen, UA: 0.2

## 2011-11-13 MED ORDER — LOPERAMIDE HCL 2 MG PO TABS: ORAL_TABLET | ORAL | Status: DC

## 2011-11-13 NOTE — Progress Notes (Signed)
Date:  11/13/2011   Name:  Harold [name]   DOB:  1955-04-03   MRN:  [id number] Gender: male Age: 56 y.o.  PCP:  [name], MD    Chief Complaint: Diarrhea, Abdominal Pain and Nausea   History of Present Illness:  Harold [name] is a 56 y.o. pleasant patient who presents with the following:  Ill for a month with a feeling in right lower quadrant that is "like a bruise" in the RLQ.  Pain waxes and wanes. Not associated with stool change, nausea or vomiting, anorexia, fever or chills.  Yesterday developed watery diarrhea that has persisted today.  Today had episode of dizziness and light headedness and was seen by the nurse at his workplace and sent home.  Said his random BS was 212 and at work was 238.  No neurological or visual symptoms.  No chest pain or tightness.  Says that he thinks the abdominal pain may be related to "stretching" of the mesh used to repair an umbilical hernia.  Had negative colonoscopy done in 2008.  Patient Active Problem List  Diagnosis  . Diabetes mellitus  . Hypertension  . Carotid arterial disease  . Neuropathy  . Hyperlipidemia  . Depression  . Occlusion and stenosis of carotid artery without mention of cerebral infarction  . Atherosclerosis of native arteries of the extremities with intermittent claudication    Past Medical History  Diagnosis Date  . Diabetes mellitus   . Peripheral vascular disease   . Carotid artery occlusion     Past Surgical History  Procedure Date  . Back surgery 2004    X2-2010  . Umbilical hernia repair 0923    steel mesh insert    History  Substance Use Topics  . Smoking status: Former Smoker    Quit date: 03/23/2003  . Smokeless tobacco: Never Used  . Alcohol Use: No    No family history on file.  Allergies  Allergen Reactions  . Septra (Bactrim) Itching  . Penicillins Hives    Allergy to All cillin drugs    Medication list has been reviewed and updated.  Outpatient Prescriptions Prior to Visit   Medication Sig Dispense Refill  . ALPRAZolam (XANAX) 0.5 MG tablet Take 0.5 mg by mouth 3 (three) times daily as needed.       Marland Kitchen aspirin 325 MG tablet Take 325 mg by mouth daily.      Marland Kitchen atenolol (TENORMIN) 25 MG tablet Take 25 mg by mouth daily.      Marland Kitchen augmented betamethasone dipropionate (DIPROLENE-AF) 0.05 % cream Apply 1 application topically 2 (two) times daily as needed. For sores      . cholecalciferol (VITAMIN D) 1000 UNITS tablet Take 1,000 Units by mouth 4 (four) times daily.      Marland Kitchen escitalopram (LEXAPRO) 20 MG tablet Take 1 tablet (20 mg total) by mouth daily.  30 tablet  12  . esomeprazole (NEXIUM) 40 MG capsule Take 40 mg by mouth 1 day or 1 dose.      . felodipine (PLENDIL) 5 MG 24 hr tablet TAKE 1 TABLET BY MOUTH DAILY  15 tablet  0  . fenofibrate micronized (LOFIBRA) 134 MG capsule Take 134 mg by mouth daily before breakfast.      . fish oil-omega-3 fatty acids 1000 MG capsule Take 1,200 mg by mouth 4 (four) times daily.       Marland Kitchen gabapentin (NEURONTIN) 600 MG tablet Take 1,200 mg by mouth once. 654m once in am,once  midday,1252m at night      . Garlic 5101MG CAPS Take 500 mg by mouth daily.      . insulin aspart (NOVOLOG) 100 UNIT/ML injection Patient is to take 45 units prior to each meal which is 3 times a day.      . insulin NPH (HUMULIN N,NOVOLIN N) 100 UNIT/ML injection Inject 75 Units into the skin 3 (three) times daily before meals.      .Marland Kitchenlevothyroxine (SYNTHROID, LEVOTHROID) 75 MCG tablet Take 75 mcg by mouth daily.      .Marland Kitchenlisinopril (PRINIVIL,ZESTRIL) 20 MG tablet Take 20 mg by mouth daily.      . metFORMIN (GLUCOPHAGE) 1000 MG tablet Take 1,000 mg by mouth 2 (two) times daily with a meal.      . Misc Natural Products (LUTEIN 20 PO) Take 20 mg by mouth daily.      . Multiple Vitamins-Minerals (MULTIVITAMIN WITH MINERALS) tablet Take 1 tablet by mouth daily.      . Potassium 99 MG TABS Take 99 mg by mouth daily.      . pregabalin (LYRICA) 75 MG capsule Take 225 mg by  mouth. Pt takes 1 pill QAM and 2 pills QHS      . simvastatin (ZOCOR) 40 MG tablet Take 40 mg by mouth every evening.      . zolpidem (AMBIEN) 10 MG tablet Take 10 mg by mouth at bedtime as needed. For sleep      . zolpidem (AMBIEN) 10 MG tablet Take 1 tablet (10 mg total) by mouth at bedtime as needed for sleep.  30 tablet  5    Review of Systems:  As per HPI, otherwise negative.    Physical Examination: Filed Vitals:   11/13/11 1603  BP: 138/72  Pulse: 59  Temp: 97.8 F (36.6 C)  Resp: 20   Filed Vitals:   11/13/11 1603  Height: 6' 1.5" (1.867 m)  Weight: 312 lb (141.522 kg)   Body mass index is 40.61 kg/(m^2). Ideal Body Weight: Weight in (lb) to have BMI = 25: 191.7   GEN: WDWN, NAD, Non-toxic, A & O x 3  Obese.  Well hydrated. HEENT: Atraumatic, Normocephalic. Neck supple. No masses, No LAD. Ears and Nose: No external deformity. CV: RRR, No M/G/R. No JVD. No thrill. No extra heart sounds. PULM: CTA B, no wheezes, crackles, rhonchi. No retractions. No resp. distress. No accessory muscle use. ABD: S, NT, ND, +BS. No rebound. No HSM. EXTR: No c/c/e NEURO Normal gait.  PSYCH: Normally interactive. Conversant. Not depressed or anxious appearing.  Calm demeanor.    Assessment and Plan: Gastroenteritis Imodium NIDDM uncontrolled Follow up with endocrinologist  Results for orders placed in visit on 11/13/11  POCT CBC      Component Value Range   WBC 6.4  4.6 - 10.2 K/uL   Lymph, poc 2.2  0.6 - 3.4   POC LYMPH PERCENT 34.4  10 - 50 %L   MID (cbc) 0.5  0 - 0.9   POC MID % 7.8  0 - 12 %M   POC Granulocyte 3.7  2 - 6.9   Granulocyte percent 57.8  37 - 80 %G   RBC 4.86  4.69 - 6.13 M/uL   Hemoglobin 14.7  14.1 - 18.1 g/dL   HCT, POC 45.5  43.5 - 53.7 %   MCV 93.7  80 - 97 fL   MCH, POC 30.2  27 - 31.2 pg   MCHC 32.3  31.8 -  35.4 g/dL   RDW, POC 13.6     Platelet Count, POC 148  142 - 424 K/uL   MPV 12.5  0 - 99.8 fL  POCT URINALYSIS DIPSTICK      Component  Value Range   Color, UA yellow     Clarity, UA clear     Glucose, UA negative     Bilirubin, UA negative     Ketones, UA negative     Spec Grav, UA 1.025     Blood, UA negative     pH, UA 5.5     Protein, UA negative     Urobilinogen, UA 0.2     Nitrite, UA negative     Leukocytes, UA Negative    POCT UA - MICROSCOPIC ONLY      Component Value Range   WBC, Ur, HPF, POC 0-2     RBC, urine, microscopic 0-3     Bacteria, U Microscopic negative     Mucus, UA negative     Epithelial cells, urine per micros 0-1     Crystals, Ur, HPF, POC negative     Casts, Ur, LPF, POC negative     Yeast, UA negative     Other sperm       Roselee Culver, MD I have reviewed and agree with documentation. Robert P. Laney Pastor, M.D.

## 2011-11-14 ENCOUNTER — Telehealth: Payer: Self-pay

## 2011-11-14 LAB — COMPREHENSIVE METABOLIC PANEL
Albumin: 4.4 g/dL (ref 3.5–5.2)
Alkaline Phosphatase: 46 U/L (ref 39–117)
BUN: 21 mg/dL (ref 6–23)
CO2: 25 mEq/L (ref 19–32)
Calcium: 10.4 mg/dL (ref 8.4–10.5)
Chloride: 102 mEq/L (ref 96–112)
Glucose, Bld: 138 mg/dL — ABNORMAL HIGH (ref 70–99)
Potassium: 4.1 mEq/L (ref 3.5–5.3)

## 2011-11-14 MED ORDER — SODIUM CHLORIDE 0.9 % IV SOLN: INTRAVENOUS | Status: DC

## 2011-11-14 NOTE — Telephone Encounter (Signed)
PT STATES HE WAS GIVEN AN OOW NOTE FOR YESTERDAY AND WAS TOLD IF HE NEEDED ANOTHER ONE FOR TODAY TO JUST GIVE [place] A CALL  PLEASE CALL [phone] AND HE WILL COME BY AND P/U. ALSO HE IS TO HAVE THAT DYE DONE IN THE MORNING AND WANTED TO KNOW IF THAT WILL  STILL BE OK. [place] [phone]

## 2011-11-14 NOTE — Telephone Encounter (Signed)
Line busy note at front desk. Okay to have the study in the am. Will speak to him if he calls back

## 2011-11-15 ENCOUNTER — Ambulatory Visit (HOSPITAL_COMMUNITY)
Admission: RE | Admit: 2011-11-15 | Discharge: 2011-11-15 | Disposition: A | Discharge status: Home / Self Care | Payer: BC Managed Care – PPO | Source: Ambulatory Visit | Attending: Vascular Surgery | Admitting: Vascular Surgery

## 2011-11-15 ENCOUNTER — Other Ambulatory Visit: Payer: Self-pay | Admitting: *Deleted

## 2011-11-15 ENCOUNTER — Encounter (HOSPITAL_COMMUNITY)
Admission: RE | Disposition: A | Discharge status: Home / Self Care | Payer: Self-pay | Source: Ambulatory Visit | Attending: Vascular Surgery

## 2011-11-15 ENCOUNTER — Other Ambulatory Visit: Payer: Self-pay

## 2011-11-15 DIAGNOSIS — I70219 Atherosclerosis of native arteries of extremities with intermittent claudication, unspecified extremity: Secondary | ICD-10-CM | POA: Insufficient documentation

## 2011-11-15 DIAGNOSIS — I6529 Occlusion and stenosis of unspecified carotid artery: Secondary | ICD-10-CM

## 2011-11-15 DIAGNOSIS — I739 Peripheral vascular disease, unspecified: Secondary | ICD-10-CM

## 2011-11-15 DIAGNOSIS — E119 Type 2 diabetes mellitus without complications: Secondary | ICD-10-CM | POA: Insufficient documentation

## 2011-11-15 HISTORY — PX: ABDOMINAL AORTAGRAM: SHX5454

## 2011-11-15 LAB — GLUCOSE, CAPILLARY: Glucose-Capillary: 235 mg/dL — ABNORMAL HIGH (ref 70–99)

## 2011-11-15 LAB — POCT I-STAT, CHEM 8
BUN: 27 mg/dL — ABNORMAL HIGH (ref 6–23)
Calcium, Ion: 1.24 mmol/L — ABNORMAL HIGH (ref 1.12–1.23)
TCO2: 24 mmol/L (ref 0–100)

## 2011-11-15 SURGERY — ABDOMINAL AORTAGRAM
Anesthesia: LOCAL

## 2011-11-15 MED ORDER — ACETAMINOPHEN 325 MG RE SUPP: 325.0000 mg | RECTAL | Status: DC | PRN

## 2011-11-15 MED ORDER — ONDANSETRON HCL 4 MG/2ML IJ SOLN: 4.0000 mg | Freq: Four times a day (QID) | INTRAMUSCULAR | Status: DC | PRN

## 2011-11-15 MED ORDER — MIDAZOLAM HCL 2 MG/2ML IJ SOLN
INTRAMUSCULAR | Status: AC
  Filled 2011-11-15: qty 2

## 2011-11-15 MED ORDER — PHENOL 1.4 % MT LIQD: 1.0000 | OROMUCOSAL | Status: DC | PRN

## 2011-11-15 MED ORDER — GUAIFENESIN-DM 100-10 MG/5ML PO SYRP: 15.0000 mL | ORAL_SOLUTION | ORAL | Status: DC | PRN

## 2011-11-15 MED ORDER — FENTANYL CITRATE 0.05 MG/ML IJ SOLN
INTRAMUSCULAR | Status: AC
  Filled 2011-11-15: qty 2

## 2011-11-15 MED ORDER — INSULIN ASPART 100 UNIT/ML ~~LOC~~ SOLN
6.0000 [IU] | Freq: Once | SUBCUTANEOUS | Status: AC
  Administered 2011-11-15: 6 [IU] via SUBCUTANEOUS

## 2011-11-15 MED ORDER — LABETALOL HCL 5 MG/ML IV SOLN: 10.0000 mg | INTRAVENOUS | Status: DC | PRN

## 2011-11-15 MED ORDER — MORPHINE SULFATE 10 MG/ML IJ SOLN: 2.0000 mg | INTRAMUSCULAR | Status: DC | PRN

## 2011-11-15 MED ORDER — HEPARIN (PORCINE) IN NACL 2-0.9 UNIT/ML-% IJ SOLN
INTRAMUSCULAR | Status: AC
  Filled 2011-11-15: qty 500

## 2011-11-15 MED ORDER — ACETAMINOPHEN 325 MG PO TABS: 325.0000 mg | ORAL_TABLET | ORAL | Status: DC | PRN

## 2011-11-15 MED ORDER — OXYCODONE-ACETAMINOPHEN 5-325 MG PO TABS
1.0000 | ORAL_TABLET | ORAL | Status: DC | PRN
  Administered 2011-11-15: 2 via ORAL

## 2011-11-15 MED ORDER — HYDRALAZINE HCL 20 MG/ML IJ SOLN: 10.0000 mg | INTRAMUSCULAR | Status: DC | PRN

## 2011-11-15 MED ORDER — METOPROLOL TARTRATE 1 MG/ML IV SOLN: 2.0000 mg | INTRAVENOUS | Status: DC | PRN

## 2011-11-15 MED ORDER — LIDOCAINE HCL (PF) 1 % IJ SOLN
INTRAMUSCULAR | Status: AC
  Filled 2011-11-15: qty 30

## 2011-11-15 MED ORDER — OXYCODONE-ACETAMINOPHEN 5-325 MG PO TABS
ORAL_TABLET | ORAL | Status: AC
  Filled 2011-11-15: qty 2

## 2011-11-15 MED ORDER — SODIUM CHLORIDE 0.45 % IV SOLN: INTRAVENOUS | Status: DC

## 2011-11-15 NOTE — Interval H&P Note (Signed)
History and Physical Interval Note:  11/15/2011 7:30 AM  Harold [name]  has presented today for surgery, with the diagnosis of PVD  The various methods of treatment have been discussed with the patient and family. After consideration of risks, benefits and other options for treatment, the patient has consented to  Procedure(s) (LRB) with comments: ABDOMINAL AORTAGRAM (N/A) as a surgical intervention .  The patient's history has been reviewed, patient examined, no change in status, stable for surgery.  I have reviewed the patient's chart and labs.  Questions were answered to the patient's satisfaction.     Earlena Werst E

## 2011-11-15 NOTE — Telephone Encounter (Signed)
LMOM to CB to let us know

## 2011-11-15 NOTE — Op Note (Signed)
Procedure: Aortogram with bilateral lower extremity runoff  Preoperative diagnosis: Claudication  A separate diagnosis: Same  Anesthesia: Local with IV sedation  Operative findings: Mild diffuse tibial disease with no significant aortoiliac superficial femoral popliteal disease  Operative details: After obtaining informed consent, the patient was brought to the [facility] lab. The patient was placed in supine position on the Angio table. Both groins were prepped and draped in usual sterile fashion. Local anesthesia was infiltrated over the right common femoral artery. Ultrasound was used to identify the right common femoral artery. Using ultrasound guidance the right common femoral artery was successfully cannulated. An 13 Versacore wire was then threaded up into the abdominal aorta under fluoroscopic guidance. A 5 French sheath was then placed over the guidewire into the right common femoral artery. This was thoroughly flushed with heparinized saline. A 5 French pigtail catheter was then placed over the guidewire up into the abdominal aorta and an abdominal aortogram was obtained. The right and left renal arteries are patent. The infrarenal abdominal aorta is patent. The left and right common iliac arteries are patent. The left and right internal iliac arteries are patent. The left and right external iliac arteries are patent. Oblique views of the pelvis were obtained due to overlying spine hardware. This again confirmed that the internal/external iliac arteries were widely patent. The common iliac arteries were also widely patent.   Next the pigtail catheter was pulled down just above the aortic bifurcation and bilateral lower extremity runoff was performed. The right and left common femoral profunda femoris and superficial femoral arteries are patent. The popliteal arteries are patent. There is mild diffuse tibial disease bilaterally.   In the right lower extremity, several spot films were performed by  going through the right femoral sheath to further define the right tibial arteries. The right anterior tibial artery has approximately 50% stenosis just after the origin. The vessel is patent distally and does fill the dorsalis pedis in the foot. There is a complete plantar arch. The peroneal artery is diminutive. There is mild atherosclerotic plaque with approximately 25% stenosis of the tibioperoneal trunk. Posterior tibial artery is patent throughout its entire course.    Several spot films were also performed of the left lower extremity. To get better opacification of the tibial vessels the 035 Versacore wire was placed back in the abdominal aorta through the sheath.  A 5 French crossover catheter was brought upon the field and advanced over the versacore wire.. A 5 French crossover catheter was used to selectively catheterize the left common iliac artery. The Versacore wire did not initially advance down the left iliac system. Therefore this was exchanged for an [place]. The Glidewire was then easily advanced down into the distal left external iliac artery. A 5 French crossover catheter was removed and exchanged for a 5 French straight catheter. Spot films left lower extremity were performed and this shows similar findings to the right. There is mild tibial disease. The peroneal artery is diminutive. There is a 25% stenosis of the proximal anterior tibial artery but the anterior tibial artery does fill distally and fills the dorsalis pedis. There is a 25% stenosis of the right tibial peroneal trunk the posterior tibial artery is widely patent although the foot there is a complete plantar arch. At this point the 5 French straight catheter was removed and the 5 [place] sheath was thoroughly flushed with heparinized saline to be removed in the holding area. The patient tolerated the procedure well and there were  no complications. The patient was taken to the holding area in stable condition.  Operative  management. The patient has no significant flow-limiting stenosis although he does have mild tibial occlusive disease which is diffuse and consistent with diabetes. The patient was counseled about weight loss as part of control of his diabetes as well as an exercise plan to try to increase his walking distance over time and improve his back problems. He will also have his back further evaluated if he continues to have problems.  Ruta Hinds, MD [facility] [place] Office: [phone] Pager: [phone]

## 2011-11-15 NOTE — H&P (View-Only) (Signed)
[facility] [place] HISTORY AND PHYSICAL   History of Present Illness:  Patient is a 57 y.o. year old male who presents for evaluation of a moderate carotid stenosis as well as bilateral lower extremity pain.  The patient previously had a right carotid endarterectomy in 2005 by me and has done well from this. Other medical problems include diabetes which is currently controlled. The patient states that he has not had any symptoms of TIA amaurosis or stroke. He currently develops pain in both calves after walking approximately 40 yards. This resolved fairly quickly with rest. However, he has gained significant weight as he is not able to be very active secondary to his leg pain. He has chronic numbness and tingling in both feet which is been present for approximately 3 months. He denies rest pain. He has no nonhealing ulcers.  Past Medical History  Diagnosis Date  . Diabetes mellitus   . Peripheral vascular disease   . Carotid artery occlusion     Past Surgical History  Procedure Date  . Back surgery 2004    X2-2010  . Umbilical hernia repair 1610    steel mesh insert     Social History History  Substance Use Topics  . Smoking status: Former Smoker    Quit date: 03/23/2003  . Smokeless tobacco: Never Used  . Alcohol Use: No    Family History History reviewed. No pertinent family history.  Allergies  Allergies  Allergen Reactions  . Septra (Bactrim) Itching  . Penicillins Hives    Allergy to All cillin drugs     Current Outpatient Prescriptions  Medication Sig Dispense Refill  . ALPRAZolam (XANAX) 0.5 MG tablet Take 0.5 mg by mouth at bedtime as needed.      Marland Kitchen aspirin 325 MG tablet Take 325 mg by mouth daily.      Marland Kitchen atenolol (TENORMIN) 25 MG tablet Take 25 mg by mouth daily.      . cholecalciferol (VITAMIN D) 1000 UNITS tablet Take 1,000 Units by mouth 4 (four) times daily.      Marland Kitchen escitalopram (LEXAPRO) 20 MG tablet Take 1 tablet (20 mg total) by  mouth daily.  30 tablet  12  . esomeprazole (NEXIUM) 40 MG capsule Take 40 mg by mouth 1 day or 1 dose.      . felodipine (PLENDIL) 5 MG 24 hr tablet TAKE 1 TABLET BY MOUTH DAILY  15 tablet  0  . fenofibrate micronized (LOFIBRA) 134 MG capsule Take 1 capsule (134 mg total) by mouth daily before breakfast.  15 capsule  0  . fish oil-omega-3 fatty acids 1000 MG capsule Take 1,200 mg by mouth 3 (three) times daily.      Marland Kitchen gabapentin (NEURONTIN) 600 MG tablet Take 2 tablets (1,200 mg total) by mouth once. 630m once in am,once midday,1204mat night  150 tablet  12  . Garlic 50960G CAPS Take 500 mg by mouth daily.      . insulin aspart (NOVOLOG) 100 UNIT/ML injection Patient is to take 45 units prior to each meal which is 3 times a day.      . insulin NPH (HUMULIN N,NOVOLIN N) 100 UNIT/ML injection Inject 75 Units into the skin 3 (three) times daily before meals.      . Marland Kitchenevothyroxine (SYNTHROID, LEVOTHROID) 75 MCG tablet Take 75 mcg by mouth daily.      . Marland Kitchenisinopril (PRINIVIL,ZESTRIL) 20 MG tablet Take 20 mg by mouth daily.      .Marland Kitchen  metFORMIN (GLUCOPHAGE) 1000 MG tablet Take 1,000 mg by mouth 2 (two) times daily with a meal.      . Misc Natural Products (LUTEIN 20 PO) Take 20 mg by mouth daily.      . Multiple Vitamins-Minerals (MULTIVITAMIN WITH MINERALS) tablet Take 1 tablet by mouth daily.      . Potassium 99 MG TABS Take 99 mg by mouth daily.      . pregabalin (LYRICA) 75 MG capsule Take 225 mg by mouth. Pt takes 1 pill QAM and 2 pills QHS      . simvastatin (ZOCOR) 40 MG tablet Take 40 mg by mouth every evening.      . zolpidem (AMBIEN) 10 MG tablet Take 10 mg by mouth at bedtime.      Marland Kitchen augmented betamethasone dipropionate (DIPROLENE AF) 0.05 % cream Apply topically 2 (two) times daily.  50 g  0  . furosemide (LASIX) 40 MG tablet Take 1 tablet (40 mg total) by mouth daily.  30 tablet  5  . predniSONE (DELTASONE) 10 MG tablet Take 4 a day for 3 days 3 a day for 3 days 2 a day for 3 days one a day  for 3 days .  30 tablet  0  . zolpidem (AMBIEN) 10 MG tablet Take 1 tablet (10 mg total) by mouth at bedtime as needed for sleep.  30 tablet  5  . DISCONTD: insulin lispro (HUMALOG) 100 UNIT/ML injection Inject 25 Units into the skin 3 (three) times daily before meals.        ROS:   General:  No weight loss, Fever, chills  HEENT: No recent headaches, no nasal bleeding, no visual changes, no sore throat  Neurologic: No dizziness, blackouts, seizures. No recent symptoms of stroke or mini- stroke. No recent episodes of slurred speech, or temporary blindness.  Cardiac: No recent episodes of chest pain/pressure, no shortness of breath at rest.  No shortness of breath with exertion.  Denies history of atrial fibrillation or irregular heartbeat  Vascular: No history of rest pain in feet.  No history of claudication.  No history of non-healing ulcer, No history of DVT   Pulmonary: No home oxygen, no productive cough, no hemoptysis,  No asthma or wheezing  Musculoskeletal:  _0  Arthritis, _1  Low back pain,  _2  Joint pain  Hematologic:No history of hypercoagulable state.  No history of easy bleeding.  No history of anemia  Gastrointestinal: No hematochezia or melena,  No gastroesophageal reflux, no trouble swallowing  Urinary: _3  chronic Kidney disease, _4  on HD - _5  MWF or _6  TTHS, _7  Burning with urination, _8  Frequent urination, _9  Difficulty urinating;   Skin: No rashes  Psychological: No history of anxiety,  No history of depression   Physical Examination  Filed Vitals:   11/07/11 1022 11/07/11 1023  BP:  119/58  Pulse:  52  Resp: 16   Height: 6' 1.5" (1.867 m)   Weight: 310 lb (140.615 kg)   SpO2: 98%     Body mass index is 40.34 kg/(m^2).  General:  Alert and oriented, no acute distress HEENT: Normal Neck: No bruit or JVD Pulmonary: Clear to auscultation bilaterally Cardiac: Regular Rate and Rhythm without murmur Abdomen: Soft, non-tender, non-distended, no  mass, periumbilical scar, obese Skin: No rash Extremity Pulses:  2+ radial, brachial, 1+femoral, absent dorsalis pedis, posterior tibial pulses bilaterally Musculoskeletal: No deformity right leg slightly more edema than left  Neurologic: Upper  and lower extremity motor 5/5 and symmetric  DATA: Patient had a bilateral carotid duplex scan today which I reviewed and interpreted. This shows a 40-60% left internal carotid artery stenosis which is unchanged from 2012. He has no significant stenosis on the right side. He had bilateral ABIs performed today which were 0.91 on the right 1.09 on the left there were triphasic waveforms but decreased toe pressures bilaterally   ASSESSMENT: Bilateral lower extremity symptoms were somewhat claudication. However the patient does have fairly normal ABIs. This may be a stenosis that is only present on demand. I believe the best option for him would be arteriogram lower extremity runoff for further evaluation of his arterial tree. We will also consider an intervention if there is a lesion amenable to this at the time of his arteriogram. Risks benefits possible complications and procedure details were explained the patient today he understands and agrees to proceed. He is scheduled for September 27.  Ruta Hinds, MD [facility] [place] Office: [phone] Pager: [phone]

## 2011-12-01 ENCOUNTER — Other Ambulatory Visit: Payer: Self-pay | Admitting: Emergency Medicine

## 2011-12-02 ENCOUNTER — Other Ambulatory Visit: Payer: Self-pay | Admitting: *Deleted

## 2011-12-20 ENCOUNTER — Encounter: Payer: Self-pay | Admitting: Physical Medicine & Rehabilitation

## 2012-01-02 ENCOUNTER — Other Ambulatory Visit: Payer: Self-pay | Admitting: Physician Assistant

## 2012-01-03 ENCOUNTER — Other Ambulatory Visit: Payer: Self-pay | Admitting: Neurosurgery

## 2012-01-06 ENCOUNTER — Encounter (HOSPITAL_COMMUNITY)
Admission: RE | Admit: 2012-01-06 | Discharge: 2012-01-06 | Disposition: A | Discharge status: Home / Self Care | Payer: BC Managed Care – PPO | Source: Ambulatory Visit | Attending: Neurosurgery | Admitting: Neurosurgery

## 2012-01-06 ENCOUNTER — Ambulatory Visit (HOSPITAL_COMMUNITY): Admission: RE | Admit: 2012-01-06 | Payer: BC Managed Care – PPO | Source: Ambulatory Visit

## 2012-01-06 ENCOUNTER — Encounter (HOSPITAL_COMMUNITY): Payer: Self-pay

## 2012-01-06 HISTORY — DX: Polyneuropathy, unspecified: G62.9

## 2012-01-06 HISTORY — DX: Other specified postprocedural states: Z98.890

## 2012-01-06 HISTORY — DX: Major depressive disorder, single episode, unspecified: F32.9

## 2012-01-06 HISTORY — DX: Hypothyroidism, unspecified: E03.9

## 2012-01-06 HISTORY — DX: Depression, unspecified: F32.A

## 2012-01-06 HISTORY — DX: Adverse effect of unspecified anesthetic, initial encounter: T41.45XA

## 2012-01-06 HISTORY — DX: Sleep apnea, unspecified: G47.30

## 2012-01-06 HISTORY — DX: Nausea with vomiting, unspecified: Z98.890

## 2012-01-06 HISTORY — DX: Other complications of anesthesia, initial encounter: T88.59XA

## 2012-01-06 HISTORY — DX: Nausea with vomiting, unspecified: R11.2

## 2012-01-06 LAB — SURGICAL PCR SCREEN: MRSA, PCR: NEGATIVE

## 2012-01-06 LAB — BASIC METABOLIC PANEL
BUN: 20 mg/dL (ref 6–23)
Chloride: 96 mEq/L (ref 96–112)
GFR calc Af Amer: 67 mL/min — ABNORMAL LOW (ref >90)
GFR calc non Af Amer: 58 mL/min — ABNORMAL LOW (ref >90)
Potassium: 4.2 mEq/L (ref 3.5–5.1)
Sodium: 136 mEq/L (ref 135–145)

## 2012-01-06 LAB — CBC
HCT: 39.6 % (ref 39.0–52.0)
Hemoglobin: 14.3 g/dL (ref 13.0–17.0)
MCHC: 36.1 g/dL — ABNORMAL HIGH (ref 30.0–36.0)
RBC: 4.59 MIL/uL (ref 4.22–5.81)
WBC: 6.2 10*3/uL (ref 4.0–10.5)

## 2012-01-06 MED ORDER — VANCOMYCIN HCL 1000 MG IV SOLR
1500.0000 mg | INTRAVENOUS | Status: AC
  Administered 2012-01-07: 1500 mg via INTRAVENOUS
  Filled 2012-01-06: qty 1500

## 2012-01-06 NOTE — Progress Notes (Signed)
Mr [name] states that he was instructed by MD's office staff , NPO after 0700.

## 2012-01-06 NOTE — Pre-Procedure Instructions (Signed)
[place]  01/06/2012   Your procedure is scheduled on: Tuesday, November 19th  Report to [facility] at 2:15PM.  Call this number if you have problems the morning of surgery: [phone]   Remember:   Do not eat food or anything to drink:After Midnight.    Take these medicines the morning of surgery with A SIP OF WATER: Atenolol (Tenormin), Gabpaentin (Neurontin), Levothyroxine (Synthyroid), Pregablin (Lyrica), Escitalopram (Ledxapro).               Stop taking Aspirin, Coumadin, Plavix, Effient and Herbal medications.  Don not take any NSAIDs ie: Ibuprofen,  Advil,Naproxen or any medication containing Aspirin.      Do not wear jewelry, make-up or nail polish.  Do not wear lotions, powders, or perfumes. You may wear deodorant.  Do not shave 48 hours prior to surgery. Men may shave face and neck.  Do not bring valuables to the hospital.  Contacts, dentures or bridgework may not be worn into surgery.  Leave suitcase in the car. After surgery it may be brought to your room.  For patients admitted to the hospital, checkout time is 11:00 AM the day of discharge.   Patients discharged the day of surgery will not be allowed to drive home.  Name and phone number of your driver: NA    Special Instructions: Shower with CHG wash (Bactoshield) tonight and again in the am prior to arriving to hospital.   Please read over the following fact sheets that you were given: Pain Booklet, Coughing and Deep Breathing and Surgical Site Infection Prevention

## 2012-01-07 ENCOUNTER — Encounter (HOSPITAL_COMMUNITY)
Admission: RE | Disposition: A | Discharge status: Home / Self Care | Payer: Self-pay | Source: Ambulatory Visit | Attending: Neurosurgery

## 2012-01-07 ENCOUNTER — Observation Stay (HOSPITAL_COMMUNITY)
Admission: RE | Admit: 2012-01-07 | Discharge: 2012-01-08 | Disposition: A | Discharge status: Home / Self Care | Payer: BC Managed Care – PPO | Source: Ambulatory Visit | Attending: Neurosurgery | Admitting: Neurosurgery

## 2012-01-07 ENCOUNTER — Encounter (HOSPITAL_COMMUNITY): Payer: Self-pay

## 2012-01-07 ENCOUNTER — Ambulatory Visit (HOSPITAL_COMMUNITY): Payer: BC Managed Care – PPO

## 2012-01-07 ENCOUNTER — Encounter (HOSPITAL_COMMUNITY): Payer: Self-pay | Admitting: Anesthesiology

## 2012-01-07 ENCOUNTER — Ambulatory Visit (HOSPITAL_COMMUNITY): Payer: BC Managed Care – PPO | Admitting: Anesthesiology

## 2012-01-07 DIAGNOSIS — E119 Type 2 diabetes mellitus without complications: Secondary | ICD-10-CM | POA: Insufficient documentation

## 2012-01-07 DIAGNOSIS — D1779 Benign lipomatous neoplasm of other sites: Secondary | ICD-10-CM | POA: Insufficient documentation

## 2012-01-07 DIAGNOSIS — Z01812 Encounter for preprocedural laboratory examination: Secondary | ICD-10-CM | POA: Insufficient documentation

## 2012-01-07 DIAGNOSIS — Z79899 Other long term (current) drug therapy: Secondary | ICD-10-CM | POA: Insufficient documentation

## 2012-01-07 DIAGNOSIS — F329 Major depressive disorder, single episode, unspecified: Secondary | ICD-10-CM

## 2012-01-07 DIAGNOSIS — M48061 Spinal stenosis, lumbar region without neurogenic claudication: Principal | ICD-10-CM | POA: Insufficient documentation

## 2012-01-07 DIAGNOSIS — I10 Essential (primary) hypertension: Secondary | ICD-10-CM | POA: Insufficient documentation

## 2012-01-07 HISTORY — PX: LUMBAR LAMINECTOMY/DECOMPRESSION MICRODISCECTOMY: SHX5026

## 2012-01-07 LAB — GLUCOSE, CAPILLARY
Glucose-Capillary: 251 mg/dL — ABNORMAL HIGH (ref 70–99)
Glucose-Capillary: 198 mg/dL — ABNORMAL HIGH (ref 70–99)

## 2012-01-07 SURGERY — LUMBAR LAMINECTOMY/DECOMPRESSION MICRODISCECTOMY 1 LEVEL
Anesthesia: General | Site: Back | Laterality: Bilateral | Wound class: Clean

## 2012-01-07 MED ORDER — GLYCOPYRROLATE 0.2 MG/ML IJ SOLN
INTRAMUSCULAR | Status: DC | PRN
  Administered 2012-01-07: .8 mg via INTRAVENOUS

## 2012-01-07 MED ORDER — ZOLPIDEM TARTRATE 5 MG PO TABS: 10.0000 mg | ORAL_TABLET | Freq: Every evening | ORAL | Status: DC | PRN

## 2012-01-07 MED ORDER — PHENOL 1.4 % MT LIQD: 1.0000 | OROMUCOSAL | Status: DC | PRN

## 2012-01-07 MED ORDER — ONDANSETRON HCL 4 MG/2ML IJ SOLN: 4.0000 mg | INTRAMUSCULAR | Status: DC | PRN

## 2012-01-07 MED ORDER — OXYCODONE HCL 5 MG PO TABS: 5.0000 mg | ORAL_TABLET | Freq: Once | ORAL | Status: DC | PRN

## 2012-01-07 MED ORDER — OXYCODONE-ACETAMINOPHEN 5-325 MG PO TABS
1.0000 | ORAL_TABLET | ORAL | Status: DC | PRN
  Administered 2012-01-08: 2 via ORAL
  Filled 2012-01-07: qty 2

## 2012-01-07 MED ORDER — LEVOTHYROXINE SODIUM 75 MCG PO TABS
75.0000 ug | ORAL_TABLET | Freq: Every day | ORAL | Status: DC
  Administered 2012-01-08: 75 ug via ORAL
  Filled 2012-01-07 (×2): qty 1

## 2012-01-07 MED ORDER — GABAPENTIN 600 MG PO TABS: 600.0000 mg | ORAL_TABLET | Freq: Three times a day (TID) | ORAL | Status: DC

## 2012-01-07 MED ORDER — FELODIPINE ER 5 MG PO TB24
5.0000 mg | ORAL_TABLET | Freq: Every day | ORAL | Status: DC
  Administered 2012-01-08: 5 mg via ORAL
  Filled 2012-01-07: qty 1

## 2012-01-07 MED ORDER — INSULIN NPH (HUMAN) (ISOPHANE) 100 UNIT/ML ~~LOC~~ SUSP
50.0000 [IU] | Freq: Every day | SUBCUTANEOUS | Status: DC
  Administered 2012-01-08: 50 [IU] via SUBCUTANEOUS
  Filled 2012-01-07: qty 10

## 2012-01-07 MED ORDER — INSULIN ASPART 100 UNIT/ML ~~LOC~~ SOLN: 0.0000 [IU] | Freq: Three times a day (TID) | SUBCUTANEOUS | Status: DC

## 2012-01-07 MED ORDER — ADULT MULTIVITAMIN W/MINERALS CH
1.0000 | ORAL_TABLET | Freq: Every day | ORAL | Status: DC
  Administered 2012-01-08: 1 via ORAL
  Filled 2012-01-07: qty 1

## 2012-01-07 MED ORDER — NEOSTIGMINE METHYLSULFATE 1 MG/ML IJ SOLN
INTRAMUSCULAR | Status: DC | PRN
  Administered 2012-01-07: 5 mg via INTRAVENOUS

## 2012-01-07 MED ORDER — ONDANSETRON HCL 4 MG/2ML IJ SOLN
INTRAMUSCULAR | Status: AC
  Filled 2012-01-07: qty 2

## 2012-01-07 MED ORDER — SIMVASTATIN 40 MG PO TABS
40.0000 mg | ORAL_TABLET | Freq: Every evening | ORAL | Status: DC
  Filled 2012-01-07: qty 1

## 2012-01-07 MED ORDER — METFORMIN HCL 500 MG PO TABS
1000.0000 mg | ORAL_TABLET | Freq: Two times a day (BID) | ORAL | Status: DC
  Administered 2012-01-08: 1000 mg via ORAL
  Filled 2012-01-07 (×3): qty 2

## 2012-01-07 MED ORDER — SODIUM CHLORIDE 0.9 % IV SOLN
INTRAVENOUS | Status: DC | PRN
  Administered 2012-01-07: 18:00:00 via INTRAVENOUS

## 2012-01-07 MED ORDER — ESCITALOPRAM OXALATE 20 MG PO TABS
20.0000 mg | ORAL_TABLET | Freq: Every day | ORAL | Status: DC
  Administered 2012-01-08: 20 mg via ORAL
  Filled 2012-01-07: qty 1

## 2012-01-07 MED ORDER — KETOROLAC TROMETHAMINE 30 MG/ML IJ SOLN
30.0000 mg | Freq: Four times a day (QID) | INTRAMUSCULAR | Status: DC
  Administered 2012-01-08: 30 mg via INTRAVENOUS
  Filled 2012-01-07 (×5): qty 1

## 2012-01-07 MED ORDER — GABAPENTIN 600 MG PO TABS
1200.0000 mg | ORAL_TABLET | Freq: Every day | ORAL | Status: DC
  Administered 2012-01-08: 1200 mg via ORAL
  Filled 2012-01-07 (×2): qty 2

## 2012-01-07 MED ORDER — SODIUM CHLORIDE 0.9 % IV SOLN: 250.0000 mL | INTRAVENOUS | Status: DC

## 2012-01-07 MED ORDER — FENTANYL CITRATE 0.05 MG/ML IJ SOLN
INTRAMUSCULAR | Status: DC | PRN
  Administered 2012-01-07 (×2): 50 ug via INTRAVENOUS
  Administered 2012-01-07: 150 ug via INTRAVENOUS
  Administered 2012-01-07 (×3): 50 ug via INTRAVENOUS

## 2012-01-07 MED ORDER — KETOROLAC TROMETHAMINE 30 MG/ML IJ SOLN
INTRAMUSCULAR | Status: DC | PRN
  Administered 2012-01-07: 30 mg via INTRAVENOUS

## 2012-01-07 MED ORDER — PROPOFOL 10 MG/ML IV BOLUS
INTRAVENOUS | Status: DC | PRN
  Administered 2012-01-07: 200 mg via INTRAVENOUS

## 2012-01-07 MED ORDER — HEMOSTATIC AGENTS (NO CHARGE) OPTIME
TOPICAL | Status: DC | PRN
  Administered 2012-01-07: 1 via TOPICAL

## 2012-01-07 MED ORDER — HYDROCODONE-ACETAMINOPHEN 5-325 MG PO TABS: 1.0000 | ORAL_TABLET | ORAL | Status: DC | PRN

## 2012-01-07 MED ORDER — POTASSIUM CHLORIDE IN NACL 20-0.9 MEQ/L-% IV SOLN
INTRAVENOUS | Status: DC
  Filled 2012-01-07 (×3): qty 1000

## 2012-01-07 MED ORDER — PANTOPRAZOLE SODIUM 40 MG PO TBEC
80.0000 mg | DELAYED_RELEASE_TABLET | Freq: Every day | ORAL | Status: DC
  Administered 2012-01-08: 80 mg via ORAL
  Filled 2012-01-07: qty 2

## 2012-01-07 MED ORDER — THROMBIN 5000 UNITS EX KIT
PACK | CUTANEOUS | Status: DC | PRN
  Administered 2012-01-07 (×2): 5000 [IU] via TOPICAL

## 2012-01-07 MED ORDER — ACETAMINOPHEN 650 MG RE SUPP: 650.0000 mg | RECTAL | Status: DC | PRN

## 2012-01-07 MED ORDER — SCOPOLAMINE 1 MG/3DAYS TD PT72
MEDICATED_PATCH | TRANSDERMAL | Status: DC | PRN
  Administered 2012-01-07: 1 via TRANSDERMAL

## 2012-01-07 MED ORDER — ROCURONIUM BROMIDE 100 MG/10ML IV SOLN
INTRAVENOUS | Status: DC | PRN
  Administered 2012-01-07: 10 mg via INTRAVENOUS
  Administered 2012-01-07: 30 mg via INTRAVENOUS
  Administered 2012-01-07: 20 mg via INTRAVENOUS
  Administered 2012-01-07 (×2): 10 mg via INTRAVENOUS

## 2012-01-07 MED ORDER — PREGABALIN 75 MG PO CAPS
75.0000 mg | ORAL_CAPSULE | Freq: Every day | ORAL | Status: DC
  Administered 2012-01-08: 75 mg via ORAL
  Filled 2012-01-07: qty 3

## 2012-01-07 MED ORDER — SODIUM CHLORIDE 0.9 % IJ SOLN
3.0000 mL | Freq: Two times a day (BID) | INTRAMUSCULAR | Status: DC
  Administered 2012-01-08: 3 mL via INTRAVENOUS

## 2012-01-07 MED ORDER — VITAMIN D3 25 MCG (1000 UNIT) PO TABS
1000.0000 [IU] | ORAL_TABLET | Freq: Four times a day (QID) | ORAL | Status: DC
  Administered 2012-01-08 (×2): 1000 [IU] via ORAL
  Filled 2012-01-07 (×5): qty 1

## 2012-01-07 MED ORDER — ONDANSETRON HCL 4 MG/2ML IJ SOLN
4.0000 mg | Freq: Four times a day (QID) | INTRAMUSCULAR | Status: AC | PRN
  Administered 2012-01-07: 4 mg via INTRAVENOUS

## 2012-01-07 MED ORDER — MORPHINE SULFATE 2 MG/ML IJ SOLN
1.0000 mg | INTRAMUSCULAR | Status: DC | PRN
  Administered 2012-01-08: 2 mg via INTRAVENOUS
  Filled 2012-01-07: qty 1

## 2012-01-07 MED ORDER — HYDROMORPHONE HCL PF 1 MG/ML IJ SOLN: 0.2500 mg | INTRAMUSCULAR | Status: DC | PRN

## 2012-01-07 MED ORDER — POTASSIUM CHLORIDE CRYS ER 10 MEQ PO TBCR
5.0000 meq | EXTENDED_RELEASE_TABLET | Freq: Two times a day (BID) | ORAL | Status: DC
  Administered 2012-01-08: 5 meq via ORAL
  Filled 2012-01-07 (×3): qty 1

## 2012-01-07 MED ORDER — PREGABALIN 50 MG PO CAPS
150.0000 mg | ORAL_CAPSULE | Freq: Every day | ORAL | Status: DC
  Administered 2012-01-08: 150 mg via ORAL
  Filled 2012-01-07: qty 3

## 2012-01-07 MED ORDER — INSULIN ASPART 100 UNIT/ML ~~LOC~~ SOLN
45.0000 [IU] | Freq: Three times a day (TID) | SUBCUTANEOUS | Status: DC
  Administered 2012-01-08: 45 [IU] via SUBCUTANEOUS
  Administered 2012-01-08: 13:00:00 via SUBCUTANEOUS

## 2012-01-07 MED ORDER — 0.9 % SODIUM CHLORIDE (POUR BTL) OPTIME
TOPICAL | Status: DC | PRN
  Administered 2012-01-07: 1000 mL

## 2012-01-07 MED ORDER — DIAZEPAM 5 MG PO TABS
5.0000 mg | ORAL_TABLET | Freq: Four times a day (QID) | ORAL | Status: DC | PRN
  Administered 2012-01-08: 5 mg via ORAL
  Filled 2012-01-07: qty 1

## 2012-01-07 MED ORDER — LIDOCAINE-EPINEPHRINE 0.5 %-1:200000 IJ SOLN
INTRAMUSCULAR | Status: DC | PRN
  Administered 2012-01-07: 10 mL

## 2012-01-07 MED ORDER — OXYCODONE HCL 5 MG/5ML PO SOLN: 5.0000 mg | Freq: Once | ORAL | Status: DC | PRN

## 2012-01-07 MED ORDER — GABAPENTIN 600 MG PO TABS
600.0000 mg | ORAL_TABLET | ORAL | Status: DC
  Administered 2012-01-08: 600 mg via ORAL
  Filled 2012-01-07 (×3): qty 1

## 2012-01-07 MED ORDER — INSULIN NPH (HUMAN) (ISOPHANE) 100 UNIT/ML ~~LOC~~ SUSP
75.0000 [IU] | Freq: Two times a day (BID) | SUBCUTANEOUS | Status: DC
  Administered 2012-01-08: 75 [IU] via SUBCUTANEOUS
  Filled 2012-01-07: qty 10

## 2012-01-07 MED ORDER — MENTHOL 3 MG MT LOZG: 1.0000 | LOZENGE | OROMUCOSAL | Status: DC | PRN

## 2012-01-07 MED ORDER — ATENOLOL 25 MG PO TABS
25.0000 mg | ORAL_TABLET | Freq: Every evening | ORAL | Status: DC
  Filled 2012-01-07: qty 1

## 2012-01-07 MED ORDER — MIDAZOLAM HCL 5 MG/5ML IJ SOLN
INTRAMUSCULAR | Status: DC | PRN
  Administered 2012-01-07: 2 mg via INTRAVENOUS

## 2012-01-07 MED ORDER — POTASSIUM 99 MG PO TABS: 99.0000 mg | ORAL_TABLET | Freq: Every day | ORAL | Status: DC

## 2012-01-07 MED ORDER — ONDANSETRON HCL 4 MG/2ML IJ SOLN
INTRAMUSCULAR | Status: DC | PRN
  Administered 2012-01-07: 4 mg via INTRAVENOUS

## 2012-01-07 MED ORDER — ALPRAZOLAM 0.5 MG PO TABS
0.5000 mg | ORAL_TABLET | Freq: Three times a day (TID) | ORAL | Status: DC | PRN
  Filled 2012-01-07: qty 1

## 2012-01-07 MED ORDER — LIDOCAINE HCL (CARDIAC) 20 MG/ML IV SOLN
INTRAVENOUS | Status: DC | PRN
  Administered 2012-01-07: 80 mg via INTRAVENOUS

## 2012-01-07 MED ORDER — SCOPOLAMINE 1 MG/3DAYS TD PT72
MEDICATED_PATCH | TRANSDERMAL | Status: AC
  Filled 2012-01-07: qty 1

## 2012-01-07 MED ORDER — LACTATED RINGERS IV SOLN
INTRAVENOUS | Status: DC | PRN
  Administered 2012-01-07: 18:00:00 via INTRAVENOUS

## 2012-01-07 MED ORDER — LISINOPRIL 20 MG PO TABS
20.0000 mg | ORAL_TABLET | Freq: Every day | ORAL | Status: DC
  Administered 2012-01-08: 20 mg via ORAL
  Filled 2012-01-07: qty 1

## 2012-01-07 MED ORDER — LUTEIN 20 PO CAPS: 20.0000 mg | ORAL_CAPSULE | Freq: Every morning | ORAL | Status: DC

## 2012-01-07 MED ORDER — ACETAMINOPHEN 10 MG/ML IV SOLN
1000.0000 mg | Freq: Four times a day (QID) | INTRAVENOUS | Status: DC
  Administered 2012-01-08 (×2): 1000 mg via INTRAVENOUS
  Filled 2012-01-07 (×4): qty 100

## 2012-01-07 MED ORDER — SUCCINYLCHOLINE CHLORIDE 20 MG/ML IJ SOLN
INTRAMUSCULAR | Status: DC | PRN
  Administered 2012-01-07: 160 mg via INTRAVENOUS

## 2012-01-07 MED ORDER — SODIUM CHLORIDE 0.9 % IJ SOLN: 3.0000 mL | INTRAMUSCULAR | Status: DC | PRN

## 2012-01-07 MED ORDER — ACETAMINOPHEN 325 MG PO TABS: 650.0000 mg | ORAL_TABLET | ORAL | Status: DC | PRN

## 2012-01-07 MED ORDER — FENOFIBRATE 160 MG PO TABS
160.0000 mg | ORAL_TABLET | Freq: Every day | ORAL | Status: DC
  Administered 2012-01-08: 160 mg via ORAL
  Filled 2012-01-07: qty 1

## 2012-01-07 SURGICAL SUPPLY — 51 items
BAG DECANTER FOR FLEXI CONT (MISCELLANEOUS) ×2 IMPLANT
BENZOIN TINCTURE PRP APPL 2/3 (GAUZE/BANDAGES/DRESSINGS) IMPLANT
BLADE SURG ROTATE 9660 (MISCELLANEOUS) IMPLANT
BUR MATCHSTICK NEURO 3.0 LAGG (BURR) ×2 IMPLANT
BUR PRECISION FLUTE 5.0 (BURR) ×2 IMPLANT
CANISTER SUCTION 2500CC (MISCELLANEOUS) ×2 IMPLANT
CLOTH BEACON ORANGE TIMEOUT ST (SAFETY) ×2 IMPLANT
CONT SPEC 4OZ CLIKSEAL STRL BL (MISCELLANEOUS) ×2 IMPLANT
DECANTER SPIKE VIAL GLASS SM (MISCELLANEOUS) ×2 IMPLANT
DERMABOND ADVANCED (GAUZE/BANDAGES/DRESSINGS) ×1
DERMABOND ADVANCED .7 DNX12 (GAUZE/BANDAGES/DRESSINGS) ×1 IMPLANT
DRAPE LAPAROTOMY 100X72X124 (DRAPES) ×2 IMPLANT
DRAPE MICROSCOPE LEICA (MISCELLANEOUS) ×2 IMPLANT
DRAPE POUCH INSTRU U-SHP 10X18 (DRAPES) ×2 IMPLANT
DRAPE SURG 17X23 STRL (DRAPES) ×2 IMPLANT
DURAPREP 26ML APPLICATOR (WOUND CARE) ×2 IMPLANT
ELECT REM PT RETURN 9FT ADLT (ELECTROSURGICAL) ×2
ELECTRODE REM PT RTRN 9FT ADLT (ELECTROSURGICAL) ×1 IMPLANT
GAUZE SPONGE 4X4 16PLY XRAY LF (GAUZE/BANDAGES/DRESSINGS) IMPLANT
GLOVE BIO SURGEON STRL SZ 6.5 (GLOVE) ×6 IMPLANT
GLOVE BIOGEL PI IND STRL 6.5 (GLOVE) ×2 IMPLANT
GLOVE BIOGEL PI INDICATOR 6.5 (GLOVE) ×2
GLOVE ECLIPSE 6.5 STRL STRAW (GLOVE) ×2 IMPLANT
GLOVE ECLIPSE 7.5 STRL STRAW (GLOVE) ×2 IMPLANT
GLOVE EXAM NITRILE LRG STRL (GLOVE) IMPLANT
GLOVE EXAM NITRILE MD LF STRL (GLOVE) IMPLANT
GLOVE EXAM NITRILE XL STR (GLOVE) IMPLANT
GLOVE EXAM NITRILE XS STR PU (GLOVE) IMPLANT
GOWN BRE IMP SLV AUR LG STRL (GOWN DISPOSABLE) ×6 IMPLANT
GOWN BRE IMP SLV AUR XL STRL (GOWN DISPOSABLE) IMPLANT
GOWN STRL REIN 2XL LVL4 (GOWN DISPOSABLE) IMPLANT
KIT BASIN OR (CUSTOM PROCEDURE TRAY) ×2 IMPLANT
KIT ROOM TURNOVER OR (KITS) ×2 IMPLANT
NEEDLE HYPO 25X1 1.5 SAFETY (NEEDLE) ×2 IMPLANT
NEEDLE SPNL 18GX3.5 QUINCKE PK (NEEDLE) ×2 IMPLANT
NS IRRIG 1000ML POUR BTL (IV SOLUTION) ×2 IMPLANT
PACK LAMINECTOMY NEURO (CUSTOM PROCEDURE TRAY) ×2 IMPLANT
PAD ARMBOARD 7.5X6 YLW CONV (MISCELLANEOUS) ×8 IMPLANT
RUBBERBAND STERILE (MISCELLANEOUS) ×4 IMPLANT
SPONGE GAUZE 4X4 12PLY (GAUZE/BANDAGES/DRESSINGS) IMPLANT
SPONGE LAP 4X18 X RAY DECT (DISPOSABLE) IMPLANT
SPONGE SURGIFOAM ABS GEL SZ50 (HEMOSTASIS) ×2 IMPLANT
STRIP CLOSURE SKIN 1/2X4 (GAUZE/BANDAGES/DRESSINGS) IMPLANT
SUT VIC AB 0 CT1 18XCR BRD8 (SUTURE) ×1 IMPLANT
SUT VIC AB 0 CT1 8-18 (SUTURE) ×1
SUT VIC AB 2-0 CT1 18 (SUTURE) ×2 IMPLANT
SUT VIC AB 3-0 SH 8-18 (SUTURE) ×4 IMPLANT
SYR 20ML ECCENTRIC (SYRINGE) ×2 IMPLANT
TOWEL OR 17X24 6PK STRL BLUE (TOWEL DISPOSABLE) ×2 IMPLANT
TOWEL OR 17X26 10 PK STRL BLUE (TOWEL DISPOSABLE) ×2 IMPLANT
WATER STERILE IRR 1000ML POUR (IV SOLUTION) ×2 IMPLANT

## 2012-01-07 NOTE — Progress Notes (Signed)
PHARMACIST - PHYSICIAN ORDER COMMUNICATION  CONCERNING: P&T Medication Policy on Herbal Medications  DESCRIPTION:  This patient's order for:  lutein  has been noted.  This product(s) is classified as an "herbal" or natural product. Due to a lack of definitive safety studies or FDA approval, nonstandard manufacturing practices, plus the potential risk of unknown drug-drug interactions while on inpatient medications, the Pharmacy and Therapeutics Committee does not permit the use of "herbal" or natural products of this type within [facility].   ACTION TAKEN: The pharmacy department is unable to verify this order at this time and your patient has been informed of this safety policy. Please reevaluate patient's clinical condition at discharge and address if the herbal or natural product(s) should be resumed at that time.  Tonette Bihari 11:04 PM

## 2012-01-07 NOTE — Op Note (Signed)
01/07/2012  9:04 PM  PATIENT:  Harold [name]  56 y.o. 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 was brought to the operating room intubated and placed under a general anesthetic. He was positioned prone on a Wilson frame with all pressure points padded. His back was prepped and draped in a sterile manner. I infiltrated 10cc 1/2%lidocaine/1:200000 strength epinephrine into the planned incision. I opened the skin with a 10 blade and continued until I was in the deep subcutaneous tissue. I used the monopolar cautery to expose the lamina of L2 and L3, and L4. I was able to confirm my location with intraoperative xray. I with further dissection discovered a large shelf of bone lateral to where the L4 lamina should be. He obviously had fused L3 and L4 as the hardware on the right was completely sealed by bony overgrowth. I made the decision to also go on the left, because it made the dissection and exposure of the canal much easier. I used the drill to thin the bone until I was able to use the Kerrison punch. I used the punch to expose the canal removing the ligamentum flavum in the process. I then with Dr. Renold Genta assistance removed the very prominent epidural fat bilaterally and decompress the L3 and L4 roots. I was able to perform facetectomies and aggressively remove bone since he was fused posterolaterally. We inspected the nerve roots on the right and left and felt the thecal sac  and roots were compression free. I irrigated the wound then closed the wound in layers with vicryl sutures. I used dermabond for a sterile dressing.   PLAN OF CARE: Admit for overnight observation  PATIENT DISPOSITION:  PACU - hemodynamically stable.   Delay start of Pharmacological VTE agent (>24hrs) due to surgical blood loss or risk of bleeding:  yes

## 2012-01-07 NOTE — Anesthesia Postprocedure Evaluation (Signed)
  Anesthesia Post-op Note  Patient: Harold [name]  Procedure(s) Performed: Procedure(s) (LRB) with comments: LUMBAR LAMINECTOMY/DECOMPRESSION MICRODISCECTOMY 1 LEVEL (Bilateral) -  Lumbar Three-Four Decompression  Patient Location: PACU  Anesthesia Type:General  Level of Consciousness: awake  Airway and Oxygen Therapy: Patient Spontanous Breathing  Post-op Pain: mild  Post-op Assessment: Post-op Vital signs reviewed  Post-op Vital Signs: Reviewed  Complications: No apparent anesthesia complications

## 2012-01-07 NOTE — Anesthesia Procedure Notes (Signed)
Procedure Name: Intubation Date/Time: 01/07/2012 6:40 PM Performed by: Maeola Harman Pre-anesthesia Checklist: Patient identified, Emergency Drugs available, Suction available, Patient being monitored and Timeout performed Patient Re-evaluated:Patient Re-evaluated prior to inductionOxygen Delivery Method: Circle system utilized Preoxygenation: Pre-oxygenation with 100% oxygen Intubation Type: IV induction Ventilation: Mask ventilation without difficulty Laryngoscope Size: Mac and 4 Grade View: Grade II Tube type: Oral Tube size: 7.5 mm Number of attempts: 1 Airway Equipment and Method: Stylet Placement Confirmation: ETT inserted through vocal cords under direct vision,  positive ETCO2 and breath sounds checked- equal and bilateral Secured at: 22 cm Tube secured with: Tape Dental Injury: Teeth and Oropharynx as per pre-operative assessment

## 2012-01-07 NOTE — Progress Notes (Signed)
Patient ID: Harold [name], male   DOB: 01/05/1956, 56 y.o.   MRN: [id number] BP 145/70  Pulse 72  Temp 98.3 F (36.8 C) (Oral)  Resp 22  SpO2 97% Alert and oriented x 4 Moving lower extremities well Doing well post op.

## 2012-01-07 NOTE — H&P (Signed)
BP 112/66  Pulse 66  Temp 97.3 F (36.3 C) (Oral)  Resp 18  SpO2 97% WK:GSUPJ LOWER EXTREMITY PAIN [name] is a long term patient whom has undergone two previous lumbar fusions at L4/5,L5/S1. He has done well after those procedures. He presents with new pain in the right lower extremity and edema in the right lower extremity.   [name] returns today for evaluation after the MRI.  What the MRI shows is that he has epidural lipomatosis which is quite prominent at L3-4.  I went back to scans even a CT back to 2005 and he does have prominent epidural fat at that level but it has become a greater percentage of the canal over time.  However, this just does not jive all that well with the acute onset of single-sided pain in the right lower extremity where he has had significant edema.  The edema long precedes the onset of pain but this is not something you see with epidural lipomatosis as somebody just has crippling pain in a radicular pattern.    The scan shows where he had his operation continues to look good.  The disc at 3-4 is great and it is not broken down or degenerated.   Allergies  Allergen Reactions  . Septra (Bactrim) Itching  . Penicillins Hives    Allergy to All cillin drugs  . Other     Walnuts, pecans    Past Surgical History  Procedure Date  . Umbilical hernia repair 0315    steel mesh insert  . Cardiac catheterization     Ballon and added Atenol  . Colonscopy   . Carotid endarterectomy 2005    Right  . Back surgery 2005 x1    X2-2010   Past Medical History  Diagnosis Date  . Diabetes mellitus   . Peripheral vascular disease   . Carotid artery occlusion   . Complication of anesthesia   . PONV (postoperative nausea and vomiting)     "Patch Works"  . Shortness of breath     with activiy  . Hypothyroidism   . Depression     situaltional   . Sleep apnea   . Neuropathy    History   Social History  . Marital Status: Single    Spouse Name: N/A    Number  of Children: N/A  . Years of Education: N/A   Occupational History  . Not on file.   Social History Main Topics  . Smoking status: Former Smoker -- 52 years    Quit date: 03/23/2003  . Smokeless tobacco: Never Used  . Alcohol Use: No  . Drug Use: No  . Sexually Active: Not on file   Other Topics Concern  . Not on file   Social History Narrative  . No narrative on file   History reviewed. No pertinent family history. Prior to Admission medications   Medication Sig Start Date End Date Taking? Authorizing Provider  ALPRAZolam Duanne Moron) 0.5 MG tablet Take 0.5 mg by mouth 3 (three) times daily as needed. For anxiety   Yes Historical Provider, MD  aspirin 325 MG tablet Take 325 mg by mouth daily.   Yes Historical Provider, MD  atenolol (TENORMIN) 25 MG tablet Take 25 mg by mouth every evening.    Yes Historical Provider, MD  cholecalciferol (VITAMIN D) 1000 UNITS tablet Take 1,000 Units by mouth 4 (four) times daily.   Yes Historical Provider, MD  escitalopram (LEXAPRO) 20 MG tablet Take 1 tablet (20 mg  total) by mouth daily. 04/29/11  Yes [name], MD  esomeprazole (NEXIUM) 40 MG capsule Take 40 mg by mouth daily.    Yes Historical Provider, MD  felodipine (PLENDIL) 5 MG 24 hr tablet Take 5 mg by mouth daily.   Yes Historical Provider, MD  fenofibrate micronized (LOFIBRA) 134 MG capsule Take 134 mg by mouth daily before breakfast. 09/03/11  Yes Mancel Bale, PA-C  fish oil-omega-3 fatty acids 1000 MG capsule Take 1,200 mg by mouth 4 (four) times daily.    Yes Historical Provider, MD  gabapentin (NEURONTIN) 600 MG tablet Take 600-1,200 mg by mouth 3 (three) times daily. 1 caps in the am, 1 cap midday,2 caps at night 04/29/11  Yes [name], MD  Garlic 916 MG CAPS Take 500 mg by mouth daily.   Yes Historical Provider, MD  insulin aspart (NOVOLOG) 100 UNIT/ML injection Patient is to take 45 units prior to each meal which is 3 times a day. 04/29/11  Yes [name], MD  insulin NPH  (HUMULIN N,NOVOLIN N) 100 UNIT/ML injection Inject 45-75 Units into the skin 3 (three) times daily before meals. 75 in the am, 50 at lunch, 75 in evening   Yes Historical Provider, MD  levothyroxine (SYNTHROID, LEVOTHROID) 75 MCG tablet Take 75 mcg by mouth daily.   Yes Historical Provider, MD  lisinopril (PRINIVIL,ZESTRIL) 20 MG tablet Take 20 mg by mouth daily.   Yes Historical Provider, MD  metFORMIN (GLUCOPHAGE) 1000 MG tablet Take 1,000 mg by mouth 2 (two) times daily with a meal.   Yes Historical Provider, MD  Misc Natural Products (LUTEIN 20 PO) Take 20 mg by mouth daily.   Yes Historical Provider, MD  Multiple Vitamins-Minerals (MULTIVITAMIN WITH MINERALS) tablet Take 1 tablet by mouth daily.   Yes Historical Provider, MD  Potassium 99 MG TABS Take 99 mg by mouth daily.   Yes Historical Provider, MD  pregabalin (LYRICA) 75 MG capsule Take 75-150 mg by mouth 2 (two) times daily. Pt takes 1 pill QAM and 2 pills QHS   Yes Historical Provider, MD  simvastatin (ZOCOR) 40 MG tablet Take 40 mg by mouth every evening.   Yes Historical Provider, MD  zolpidem (AMBIEN) 10 MG tablet Take 10 mg by mouth at bedtime as needed. For insomnia   Yes Historical Provider, MD   Physical Exam  Constitutional: He is oriented to person, place, and time. He appears well-developed and well-nourished.  HENT:  Head: Normocephalic and atraumatic.  Right Ear: External ear normal.  Left Ear: External ear normal.  Nose: Nose normal.  Eyes: Conjunctivae normal and EOM are normal. Pupils are equal, round, and reactive to light.  Neck: Normal range of motion. Neck supple.  Cardiovascular: Normal rate, regular rhythm and normal heart sounds.   Pulmonary/Chest: Effort normal and breath sounds normal.  Abdominal: Soft. Bowel sounds are normal.  Neurological: He is alert and oriented to person, place, and time. He has normal strength. No cranial nerve deficit. He exhibits normal muscle tone. Coordination normal. He displays  no Babinski's sign on the right side. He displays no Babinski's sign on the left side.  Reflex Scores:      Tricep reflexes are 2+ on the right side and 2+ on the left side.      Bicep reflexes are 2+ on the right side and 2+ on the left side.      Brachioradialis reflexes are 2+ on the right side and 2+ on the left side.  Patellar reflexes are 0 on the right side.      Achilles reflexes are 0 on the right side. Skin: Skin is warm and dry. He is not diaphoretic.  Psychiatric: He has a normal mood and affect. His behavior is normal. Judgment and thought content normal.   [name] returns with an EMG of the lumbar spine. It shows possible mild L4 radiculitis.  He does have the epidural lipomatosis.  It is the only thing that we can see at this point in time.  I am not sure that this is causing the problems that he is having but it has progressed beyond what was there previously.  This would be done at the level of the last fusion. I would only be removing and going in on the right side as he has absolutely no problems on the left side.    Risks and benefits were discussed. He understands that he may not get any better but he just says he is having too many problems in that right lower extremity at this time.

## 2012-01-07 NOTE — Transfer of Care (Signed)
Immediate Anesthesia Transfer of Care Note  Patient: Harold [name]  Procedure(s) Performed: Procedure(s) (LRB) with comments: LUMBAR LAMINECTOMY/DECOMPRESSION MICRODISCECTOMY 1 LEVEL (Bilateral) -  Lumbar Three-Four Decompression  Patient Location: PACU  Anesthesia Type:General  Level of Consciousness: awake, alert , oriented and patient cooperative  Airway & Oxygen Therapy: Patient Spontanous Breathing and Patient connected to face mask oxygen  Post-op Assessment: Report given to PACU RN, Post -op Vital signs reviewed and stable and Patient moving all extremities X 4  Post vital signs: Reviewed and stable  Complications: No apparent anesthesia complications

## 2012-01-07 NOTE — Anesthesia Preprocedure Evaluation (Addendum)
Anesthesia Evaluation  Patient identified by MRN, date of birth, ID band Patient awake    Reviewed: Allergy & Precautions, H&P , NPO status , Patient's Chart, lab work & pertinent test results, reviewed documented beta blocker date and time   History of Anesthesia Complications (+) PONV  Airway Mallampati: III  Neck ROM: full    Dental  (+) Teeth Intact and Dental Advisory Given   Pulmonary shortness of breath and with exertion, sleep apnea and Continuous Positive Airway Pressure Ventilation ,  breath sounds clear to auscultation        Cardiovascular hypertension, Pt. on medications Rhythm:Regular Rate:Normal     Neuro/Psych Depression    GI/Hepatic   Endo/Other  diabetes, Well Controlled, Type 2Hypothyroidism Morbid obesity  Renal/GU      Musculoskeletal   Abdominal (+)  Abdomen: soft. Bowel sounds: normal.  Peds  Hematology   Anesthesia Other Findings   Reproductive/Obstetrics                         Anesthesia Physical Anesthesia Plan  ASA: III  Anesthesia Plan: General   Post-op Pain Management:    Induction: Intravenous  Airway Management Planned: Oral ETT  Additional Equipment:   Intra-op Plan:   Post-operative Plan: Extubation in OR  Informed Consent: I have reviewed the patients History and Physical, chart, labs and discussed the procedure including the risks, benefits and alternatives for the proposed anesthesia with the patient or authorized representative who has indicated his/her understanding and acceptance.     Plan Discussed with: CRNA and Surgeon  Anesthesia Plan Comments:         Anesthesia Quick Evaluation

## 2012-01-08 LAB — HEMOGLOBIN A1C: Mean Plasma Glucose: 183 mg/dL — ABNORMAL HIGH (ref <117)

## 2012-01-08 LAB — GLUCOSE, CAPILLARY: Glucose-Capillary: 195 mg/dL — ABNORMAL HIGH (ref 70–99)

## 2012-01-08 MED ORDER — OXYCODONE-ACETAMINOPHEN 5-325 MG PO TABS: 1.0000 | ORAL_TABLET | Freq: Four times a day (QID) | ORAL | Status: DC | PRN

## 2012-01-08 MED ORDER — POTASSIUM CHLORIDE 20 MEQ/15ML (10%) PO LIQD
5.0000 meq | Freq: Two times a day (BID) | ORAL | Status: DC
  Filled 2012-01-08 (×2): qty 7.5

## 2012-01-08 MED ORDER — CYCLOBENZAPRINE HCL 5 MG PO TABS: 5.0000 mg | ORAL_TABLET | Freq: Three times a day (TID) | ORAL | Status: DC | PRN

## 2012-01-08 MED ORDER — CYCLOBENZAPRINE HCL 10 MG PO TABS: 10.0000 mg | ORAL_TABLET | Freq: Three times a day (TID) | ORAL | Status: DC | PRN

## 2012-01-08 NOTE — Discharge Summary (Signed)
Physician Discharge Summary  Patient ID: Harold [name] MRN: [id number] DOB/AGE: 1955/06/11 56 y.o.  Admit date: 01/07/2012 Discharge date: 01/08/2012  Admission Diagnoses:Epidural lipomatosis, lumbar stenosis  Discharge Diagnoses: Epidural lipomatosis, lumbar stenosis Active Problems:  * No active hospital problems. *    Discharged Condition: good  Hospital Course: Harold [name] was taken to the operating room and underwent a lumbar laminectomy of L3 for decompression of the spinal canal. Intraoperative findings were consistent with epidural lipomatosis, and also that he is fused at L3/4. The L3/4 facet was completely fused bilaterally. Post op he is ambulating, voiding, and tolerating a regular diet. His wound is clean dry and without signs of infection.   Consults: None  Significant Diagnostic Studies: none  Treatments: surgery: as above  Discharge Exam: Blood pressure 150/92, pulse 60, temperature 97.5 F (36.4 C), temperature source Oral, resp. rate 18, SpO2 95.00%. General appearance: alert, cooperative, appears stated age and no distress Neurologic: Grossly normal  Disposition: 01-Home or Self Care  Discharge Orders    Future Appointments: Provider: Department: Dept Phone: Center:   11/19/2012 1:30 PM [facility] [phone] [facility]   11/19/2012 2:00 PM [facility] [phone] [facility]   11/19/2012 3:00 PM Princess Perna, NP [facility] [phone] [facility]       Medication List     As of 01/08/2012 12:38 PM    TAKE these medications         ALPRAZolam 0.5 MG tablet   Commonly known as: XANAX   Take 0.5 mg by mouth 3 (three) times daily as needed. For anxiety      aspirin 325 MG tablet   Take 325 mg by mouth daily.      atenolol 25 MG tablet   Commonly known as: TENORMIN   Take 25 mg by mouth every evening.      cholecalciferol 1000 UNITS tablet   Commonly known as: VITAMIN D   Take 1,000 Units by mouth 4 (four) times daily.      cyclobenzaprine 5 MG tablet   Commonly known as: FLEXERIL   Take 1 tablet (5 mg total) by mouth 3 (three) times daily as needed for muscle spasms.      escitalopram 20 MG tablet   Commonly known as: LEXAPRO   Take 1 tablet (20 mg total) by mouth daily.      esomeprazole 40 MG capsule   Commonly known as: NEXIUM   Take 40 mg by mouth daily.      felodipine 5 MG 24 hr tablet   Commonly known as: PLENDIL   Take 5 mg by mouth daily.      fenofibrate micronized 134 MG capsule   Commonly known as: LOFIBRA   Take 134 mg by mouth daily before breakfast.      fish oil-omega-3 fatty acids 1000 MG capsule   Take 1,200 mg by mouth 4 (four) times daily.      gabapentin 600 MG tablet   Commonly known as: NEURONTIN   Take 600-1,200 mg by mouth 3 (three) times daily. 1 caps in the am, 1 cap midday,2 caps at night      Garlic 829 MG Caps   Take 500 mg by mouth daily.      insulin aspart 100 UNIT/ML injection   Commonly known as: novoLOG   Patient is to take 45 units prior to each meal which is 3 times a day.  insulin NPH 100 UNIT/ML injection   Commonly known as: HUMULIN N,NOVOLIN N   Inject 45-75 Units into the skin 3 (three) times daily before meals. 75 in the am, 50 at lunch, 75 in evening      levothyroxine 75 MCG tablet   Commonly known as: SYNTHROID, LEVOTHROID   Take 75 mcg by mouth daily.      lisinopril 20 MG tablet   Commonly known as: PRINIVIL,ZESTRIL   Take 20 mg by mouth daily.      LUTEIN 20 PO   Take 20 mg by mouth daily.      metFORMIN 1000 MG tablet   Commonly known as: GLUCOPHAGE   Take 1,000 mg by mouth 2 (two) times daily with a meal.      multivitamin with minerals tablet   Take 1 tablet by mouth daily.      oxyCODONE-acetaminophen 5-325 MG per tablet   Commonly known as: PERCOCET/ROXICET   Take 1 tablet by mouth every 6 (six) hours as needed for pain.       Potassium 99 MG Tabs   Take 99 mg by mouth daily.      pregabalin 75 MG capsule   Commonly known as: LYRICA   Take 75-150 mg by mouth 2 (two) times daily. Pt takes 1 pill QAM and 2 pills QHS      simvastatin 40 MG tablet   Commonly known as: ZOCOR   Take 40 mg by mouth every evening.      zolpidem 10 MG tablet   Commonly known as: AMBIEN   Take 10 mg by mouth at bedtime as needed. For insomnia         Signed: Liliann File L 01/08/2012, 12:38 PM

## 2012-01-08 NOTE — Progress Notes (Signed)
Pt given D/C instructions with Rx's, verbal understanding given. Pt D/C'd home via wheelchair @ 1430 per MD order. Holli Humbles, RN

## 2012-01-08 NOTE — Plan of Care (Signed)
Problem: Consults Goal: Diagnosis - Spinal Surgery Outcome: Completed/Met Date Met:  01/08/12 Lumbar Laminectomy (Complex)

## 2012-01-08 NOTE — Progress Notes (Signed)
Utilization review completed. Virgel Haro, RN, BSN. 

## 2012-01-09 ENCOUNTER — Encounter (HOSPITAL_COMMUNITY): Payer: Self-pay | Admitting: Neurosurgery

## 2012-01-10 ENCOUNTER — Ambulatory Visit: Payer: BC Managed Care – PPO | Admitting: Physical Medicine & Rehabilitation

## 2012-01-10 ENCOUNTER — Encounter (HOSPITAL_COMMUNITY): Payer: Self-pay

## 2012-02-03 ENCOUNTER — Other Ambulatory Visit: Payer: Self-pay | Admitting: Physician Assistant

## 2012-02-03 NOTE — Telephone Encounter (Signed)
Chart pulled at nurses station at [facility]

## 2012-02-04 ENCOUNTER — Other Ambulatory Visit: Payer: Self-pay | Admitting: Radiology

## 2012-03-07 ENCOUNTER — Other Ambulatory Visit: Payer: Self-pay | Admitting: Emergency Medicine

## 2012-03-09 ENCOUNTER — Other Ambulatory Visit (HOSPITAL_COMMUNITY): Payer: Self-pay | Admitting: Cardiology

## 2012-03-09 DIAGNOSIS — I872 Venous insufficiency (chronic) (peripheral): Secondary | ICD-10-CM

## 2012-03-09 DIAGNOSIS — R609 Edema, unspecified: Secondary | ICD-10-CM

## 2012-03-17 ENCOUNTER — Other Ambulatory Visit: Payer: Self-pay | Admitting: Emergency Medicine

## 2012-04-02 ENCOUNTER — Encounter (HOSPITAL_COMMUNITY): Payer: BC Managed Care – PPO

## 2012-04-07 ENCOUNTER — Other Ambulatory Visit: Payer: Self-pay | Admitting: Emergency Medicine

## 2012-04-07 ENCOUNTER — Other Ambulatory Visit: Payer: Self-pay | Admitting: Radiology

## 2012-04-13 ENCOUNTER — Ambulatory Visit (HOSPITAL_COMMUNITY)
Admission: RE | Admit: 2012-04-13 | Discharge: 2012-04-13 | Disposition: A | Discharge status: Home / Self Care | Payer: PRIVATE HEALTH INSURANCE | Source: Ambulatory Visit | Attending: Cardiology | Admitting: Cardiology

## 2012-04-13 DIAGNOSIS — R609 Edema, unspecified: Secondary | ICD-10-CM

## 2012-04-13 DIAGNOSIS — M7989 Other specified soft tissue disorders: Secondary | ICD-10-CM | POA: Insufficient documentation

## 2012-04-13 DIAGNOSIS — I872 Venous insufficiency (chronic) (peripheral): Secondary | ICD-10-CM | POA: Insufficient documentation

## 2012-04-13 DIAGNOSIS — E669 Obesity, unspecified: Secondary | ICD-10-CM | POA: Insufficient documentation

## 2012-04-13 NOTE — Progress Notes (Signed)
Venous Duplex Completed for Venous Insuff. Harold [name]

## 2012-04-18 DIAGNOSIS — I272 Pulmonary hypertension, unspecified: Secondary | ICD-10-CM

## 2012-04-18 HISTORY — DX: Pulmonary hypertension, unspecified: I27.20

## 2012-04-23 ENCOUNTER — Other Ambulatory Visit: Payer: Self-pay | Admitting: Cardiology

## 2012-04-23 ENCOUNTER — Ambulatory Visit
Admission: RE | Admit: 2012-04-23 | Discharge: 2012-04-23 | Disposition: A | Discharge status: Home / Self Care | Payer: PRIVATE HEALTH INSURANCE | Source: Ambulatory Visit | Attending: Cardiology | Admitting: Cardiology

## 2012-04-23 ENCOUNTER — Other Ambulatory Visit (HOSPITAL_COMMUNITY): Payer: Self-pay | Admitting: Cardiology

## 2012-04-23 ENCOUNTER — Encounter (HOSPITAL_COMMUNITY): Payer: Self-pay | Admitting: Pharmacy Technician

## 2012-04-23 DIAGNOSIS — R0602 Shortness of breath: Secondary | ICD-10-CM

## 2012-04-23 DIAGNOSIS — R609 Edema, unspecified: Secondary | ICD-10-CM

## 2012-04-23 DIAGNOSIS — I25810 Atherosclerosis of coronary artery bypass graft(s) without angina pectoris: Secondary | ICD-10-CM

## 2012-04-24 ENCOUNTER — Inpatient Hospital Stay (HOSPITAL_COMMUNITY): Admission: RE | Admit: 2012-04-24 | Payer: BC Managed Care – PPO | Source: Ambulatory Visit

## 2012-04-25 ENCOUNTER — Other Ambulatory Visit: Payer: Self-pay | Admitting: Cardiology

## 2012-04-26 ENCOUNTER — Encounter (HOSPITAL_COMMUNITY): Payer: Self-pay | Admitting: Cardiology

## 2012-04-26 DIAGNOSIS — Z9861 Coronary angioplasty status: Secondary | ICD-10-CM | POA: Diagnosis present

## 2012-04-26 DIAGNOSIS — R0609 Other forms of dyspnea: Secondary | ICD-10-CM | POA: Diagnosis present

## 2012-04-26 DIAGNOSIS — I2510 Atherosclerotic heart disease of native coronary artery without angina pectoris: Secondary | ICD-10-CM | POA: Diagnosis present

## 2012-04-26 NOTE — H&P (Signed)
History and Physical Interval Note:  NAME:  Harold [name]   MRN: [id number] DOB:  [birth year]   ADMIT DATE: 04/27/2012   04/27/2012 11:06 AM  Harold [name] is a 57 y.o. male with a detailed past medical history as noted below. He was last seen on March 5 at [facility] to followup from [place] which did not reveal significant venous insufficiency. However in this interim from January to March she's been noticing worsening exertional shortness of breath that is occasionally associated with left arm numbness. It is gone from reducing his at general activity level with significant insertion to now being which is to be walking around the house. This is quite similar to the symptoms he had in 2003 when he underwent angioplasty to the circumflex. Due to this concern and his body habitus with a likely difficult nuclear stress testing. I was concerned this could be due to an unstable angina/crescendo angina equivalent process. We discussed the options and agreed the best option would be to proceed with cardiac catheterization. We'll consider performing right heart catheterization as well to evaluate for bony pressures with his obesity and OSA. If not coronary artery disease one concern could be worsening obesity hypoventilation syndrome.  Past Medical History  Diagnosis Date  . Diabetes mellitus   . Peripheral vascular disease   . Carotid artery occlusion   . Complication of anesthesia   . PONV (postoperative nausea and vomiting)     "Patch Works"  . Shortness of breath     with activiy  . Hypothyroidism   . Depression     situaltional   . Sleep apnea   . Neuropathy   . CAD (coronary artery disease), native coronary artery 2003    3 vessel disease: Apical LAD subtotal occlusion, proximal OM 2 occlusion with retrograde flow, diffuse severe circumflex stenoses treated with PTCA alone with a 2.0 x 20 mm balloon; normal EF by echo  . Morbid obesity with BMI of  40.0-44.9, adult     BMI 42  . Dyslipidemia associated with type 2 diabetes mellitus    Past Surgical History  Procedure Laterality Date  . Umbilical hernia repair  2009    steel mesh insert  . Cardiac catheterization      Ballon and added Atenol  . Colonscopy    . Carotid endarterectomy  2005    Right  . Back surgery  2005 x1    X2-2010  . Lumbar laminectomy/decompression microdiscectomy  01/07/2012    Procedure: LUMBAR LAMINECTOMY/DECOMPRESSION MICRODISCECTOMY 1 LEVEL;  Surgeon: Winfield Cunas, MD;  Location: [place] [facility];  Service: Neurosurgery;  Laterality: Bilateral;   Lumbar Three-Four Decompression    FAMHx: No family history on file.  SOCHx:  reports that he quit smoking about 9 years ago. He has never used smokeless tobacco. He reports that he does not drink alcohol or use illicit drugs.  ALLERGIES: Allergies  Allergen Reactions  . Septra (Bactrim) Itching  . Penicillins Hives    Allergy to All cillin drugs  . Other     Walnuts, pecans     HOME MEDICATIONS: Prescriptions prior to admission  Medication Sig Dispense Refill  . ALPRAZolam (XANAX) 0.5 MG tablet Take 0.5 mg by mouth 3 (three) times daily as needed. For anxiety      . aspirin 325 MG tablet Take 325 mg by mouth daily.      Marland Kitchen atenolol (TENORMIN) 25 MG tablet Take 25 mg by mouth every  evening.       . cholecalciferol (VITAMIN D) 1000 UNITS tablet Take 1,000 Units by mouth 4 (four) times daily.      . cyclobenzaprine (FLEXERIL) 10 MG tablet Take 1 tablet (10 mg total) by mouth 3 (three) times daily as needed for muscle spasms.  45 tablet  0  . escitalopram (LEXAPRO) 20 MG tablet Take 1 tablet (20 mg total) by mouth daily.  30 tablet  12  . esomeprazole (NEXIUM) 40 MG capsule Take 40 mg by mouth daily.       . felodipine (PLENDIL) 5 MG 24 hr tablet Take 5 mg by mouth daily.      . fenofibrate micronized (LOFIBRA) 134 MG capsule Take 134 mg by mouth daily before breakfast.      . fish oil-omega-3 fatty  acids 1000 MG capsule Take 1,200 mg by mouth 4 (four) times daily.       . furosemide (LASIX) 40 MG tablet Take 40 mg by mouth daily.      Marland Kitchen gabapentin (NEURONTIN) 600 MG tablet Take 600-1,200 mg by mouth 3 (three) times daily. 1 caps in the am, 1 cap midday,2 caps at night      . Garlic 259 MG CAPS Take 500 mg by mouth daily.      . insulin aspart (NOVOLOG) 100 UNIT/ML injection Patient is to take 45 units prior to each meal which is 3 times a day.      . insulin NPH (HUMULIN N,NOVOLIN N) 100 UNIT/ML injection Inject 45-75 Units into the skin 3 (three) times daily before meals. 75 in the am, 50 at lunch, 75 in evening      . levothyroxine (SYNTHROID, LEVOTHROID) 75 MCG tablet Take 75 mcg by mouth daily.      Marland Kitchen lisinopril (PRINIVIL,ZESTRIL) 20 MG tablet Take 20 mg by mouth daily.      . metFORMIN (GLUCOPHAGE) 1000 MG tablet Take 1,000 mg by mouth 2 (two) times daily with a meal.      . Misc Natural Products (LUTEIN 20 PO) Take 20 mg by mouth daily.      . Multiple Vitamins-Minerals (MULTIVITAMIN WITH MINERALS) tablet Take 1 tablet by mouth daily.      . Potassium 99 MG TABS Take 99 mg by mouth daily.      . pregabalin (LYRICA) 75 MG capsule Take 75-150 mg by mouth 2 (two) times daily. Pt takes 1 pill QAM and 2 pills QHS      . simvastatin (ZOCOR) 40 MG tablet Take 40 mg by mouth every evening.      . zolpidem (AMBIEN) 10 MG tablet Take 10 mg by mouth at bedtime as needed for sleep.        PHYSICAL EXAM:Blood pressure 124/71, pulse 50, temperature 96.6 F (35.9 C), temperature source Oral, resp. rate 18, height _0  (1.854 m), weight 148.78 kg (328 lb), SpO2 95.00%.  IMPRESSION & PLAN The patients' history has been reviewed, patient examined, no change in status from most recent note, stable for surgery. I have reviewed the patients' chart and labs. Questions were answered to the patient's satisfaction.    Harold [name] has presented today for surgery, with the diagnosis of chest pain The  various methods of treatment have been discussed with the patient and family.   Risks / Complications include, but not limited to: Death, MI, CVA/TIA, VF/VT (with defibrillation), Bradycardia (need for temporary pacer placement), contrast induced nephropathy, bleeding / bruising / hematoma / pseudoaneurysm, vascular or  coronary injury (with possible emergent CT or Vascular Surgery), adverse medication reactions, infection.     After consideration of risks, benefits and other options for treatment, the patient has consented to Procedure(s):  LEFT HEART CATHETERIZATION AND CORONARY ANGIOGRAPHY +/- AD HOC PERCUTANEOUS CORONARY INTERVENTION RIGHT HEART CATHETERIZATION   as a surgical intervention.   We will proceed with the planned procedure.   [place]. [place], [place]  [id number]  [phone]  04/27/2012 11:06 AM

## 2012-04-27 ENCOUNTER — Inpatient Hospital Stay (HOSPITAL_COMMUNITY)
Admission: RE | Admit: 2012-04-27 | Discharge: 2012-04-30 | DRG: 247 | Disposition: A | Discharge status: Home / Self Care | Payer: PRIVATE HEALTH INSURANCE | Source: Ambulatory Visit | Attending: Cardiology | Admitting: Cardiology

## 2012-04-27 ENCOUNTER — Other Ambulatory Visit: Payer: Self-pay | Admitting: *Deleted

## 2012-04-27 ENCOUNTER — Encounter (HOSPITAL_COMMUNITY): Payer: Self-pay | Admitting: General Practice

## 2012-04-27 ENCOUNTER — Encounter (HOSPITAL_COMMUNITY)
Admission: RE | Disposition: A | Discharge status: Home / Self Care | Payer: Self-pay | Source: Ambulatory Visit | Attending: Cardiology

## 2012-04-27 DIAGNOSIS — I70219 Atherosclerosis of native arteries of extremities with intermittent claudication, unspecified extremity: Secondary | ICD-10-CM | POA: Diagnosis present

## 2012-04-27 DIAGNOSIS — Z79899 Other long term (current) drug therapy: Secondary | ICD-10-CM

## 2012-04-27 DIAGNOSIS — N184 Chronic kidney disease, stage 4 (severe): Secondary | ICD-10-CM | POA: Diagnosis present

## 2012-04-27 DIAGNOSIS — E119 Type 2 diabetes mellitus without complications: Secondary | ICD-10-CM | POA: Diagnosis present

## 2012-04-27 DIAGNOSIS — I10 Essential (primary) hypertension: Secondary | ICD-10-CM | POA: Diagnosis present

## 2012-04-27 DIAGNOSIS — E039 Hypothyroidism, unspecified: Secondary | ICD-10-CM | POA: Diagnosis present

## 2012-04-27 DIAGNOSIS — Z7982 Long term (current) use of aspirin: Secondary | ICD-10-CM

## 2012-04-27 DIAGNOSIS — Z6841 Body Mass Index (BMI) 40.0 and over, adult: Secondary | ICD-10-CM

## 2012-04-27 DIAGNOSIS — G4733 Obstructive sleep apnea (adult) (pediatric): Secondary | ICD-10-CM | POA: Diagnosis present

## 2012-04-27 DIAGNOSIS — I2510 Atherosclerotic heart disease of native coronary artery without angina pectoris: Secondary | ICD-10-CM

## 2012-04-27 DIAGNOSIS — N182 Chronic kidney disease, stage 2 (mild): Secondary | ICD-10-CM | POA: Diagnosis present

## 2012-04-27 DIAGNOSIS — I6529 Occlusion and stenosis of unspecified carotid artery: Secondary | ICD-10-CM | POA: Diagnosis present

## 2012-04-27 DIAGNOSIS — I2789 Other specified pulmonary heart diseases: Secondary | ICD-10-CM | POA: Diagnosis present

## 2012-04-27 DIAGNOSIS — Z794 Long term (current) use of insulin: Secondary | ICD-10-CM

## 2012-04-27 DIAGNOSIS — R209 Unspecified disturbances of skin sensation: Secondary | ICD-10-CM | POA: Diagnosis present

## 2012-04-27 DIAGNOSIS — R0609 Other forms of dyspnea: Secondary | ICD-10-CM | POA: Diagnosis present

## 2012-04-27 DIAGNOSIS — R0602 Shortness of breath: Secondary | ICD-10-CM | POA: Diagnosis present

## 2012-04-27 DIAGNOSIS — Z9861 Coronary angioplasty status: Secondary | ICD-10-CM

## 2012-04-27 DIAGNOSIS — Z01810 Encounter for preprocedural cardiovascular examination: Secondary | ICD-10-CM

## 2012-04-27 DIAGNOSIS — E1169 Type 2 diabetes mellitus with other specified complication: Secondary | ICD-10-CM | POA: Diagnosis present

## 2012-04-27 DIAGNOSIS — E1149 Type 2 diabetes mellitus with other diabetic neurological complication: Secondary | ICD-10-CM | POA: Diagnosis present

## 2012-04-27 DIAGNOSIS — Z955 Presence of coronary angioplasty implant and graft: Secondary | ICD-10-CM

## 2012-04-27 DIAGNOSIS — E785 Hyperlipidemia, unspecified: Secondary | ICD-10-CM | POA: Diagnosis present

## 2012-04-27 DIAGNOSIS — I272 Pulmonary hypertension, unspecified: Secondary | ICD-10-CM | POA: Diagnosis present

## 2012-04-27 HISTORY — DX: Pulmonary hypertension, unspecified: I27.20

## 2012-04-27 HISTORY — DX: Chronic kidney disease, stage 2 (mild): N18.2

## 2012-04-27 HISTORY — DX: Gastro-esophageal reflux disease without esophagitis: K21.9

## 2012-04-27 HISTORY — DX: Type 2 diabetes mellitus with other specified complication: E11.69

## 2012-04-27 HISTORY — DX: Other specified disorders of kidney and ureter: N28.89

## 2012-04-27 HISTORY — DX: Type 2 diabetes mellitus with other specified complication: E78.5

## 2012-04-27 HISTORY — PX: OTHER SURGICAL HISTORY: SHX169

## 2012-04-27 HISTORY — PX: LEFT AND RIGHT HEART CATHETERIZATION WITH CORONARY ANGIOGRAM: SHX5449

## 2012-04-27 LAB — CBC
HCT: 38.9 % — ABNORMAL LOW (ref 39.0–52.0)
MCH: 30.9 pg (ref 26.0–34.0)
MCV: 85.9 fL (ref 78.0–100.0)
Platelets: 138 10*3/uL — ABNORMAL LOW (ref 150–400)
RDW: 13.8 % (ref 11.5–15.5)

## 2012-04-27 LAB — GLUCOSE, CAPILLARY
Glucose-Capillary: 234 mg/dL — ABNORMAL HIGH (ref 70–99)
Glucose-Capillary: 346 mg/dL — ABNORMAL HIGH (ref 70–99)
Glucose-Capillary: 297 mg/dL — ABNORMAL HIGH (ref 70–99)

## 2012-04-27 SURGERY — LEFT AND RIGHT HEART CATHETERIZATION WITH CORONARY ANGIOGRAM
Anesthesia: LOCAL

## 2012-04-27 MED ORDER — GABAPENTIN 600 MG PO TABS: 600.0000 mg | ORAL_TABLET | Freq: Three times a day (TID) | ORAL | Status: DC

## 2012-04-27 MED ORDER — SODIUM CHLORIDE 0.9 % IJ SOLN: 3.0000 mL | Freq: Two times a day (BID) | INTRAMUSCULAR | Status: DC

## 2012-04-27 MED ORDER — VERAPAMIL HCL 2.5 MG/ML IV SOLN
INTRAVENOUS | Status: AC
  Filled 2012-04-27: qty 2

## 2012-04-27 MED ORDER — GABAPENTIN 300 MG PO CAPS
600.0000 mg | ORAL_CAPSULE | Freq: Two times a day (BID) | ORAL | Status: DC
  Administered 2012-04-27 – 2012-04-30 (×6): 600 mg via ORAL
  Filled 2012-04-27 (×9): qty 2

## 2012-04-27 MED ORDER — ATORVASTATIN CALCIUM 20 MG PO TABS
20.0000 mg | ORAL_TABLET | Freq: Every day | ORAL | Status: DC
  Administered 2012-04-27 – 2012-04-29 (×3): 20 mg via ORAL
  Filled 2012-04-27 (×4): qty 1

## 2012-04-27 MED ORDER — RANOLAZINE ER 500 MG PO TB12
500.0000 mg | ORAL_TABLET | Freq: Two times a day (BID) | ORAL | Status: DC
  Administered 2012-04-27 – 2012-04-30 (×6): 500 mg via ORAL
  Filled 2012-04-27 (×7): qty 1

## 2012-04-27 MED ORDER — INSULIN GLARGINE 100 UNIT/ML ~~LOC~~ SOLN
14.0000 [IU] | Freq: Two times a day (BID) | SUBCUTANEOUS | Status: DC
  Administered 2012-04-27 – 2012-04-28 (×2): 14 [IU] via SUBCUTANEOUS

## 2012-04-27 MED ORDER — INSULIN ASPART 100 UNIT/ML ~~LOC~~ SOLN
0.0000 [IU] | Freq: Every day | SUBCUTANEOUS | Status: DC
  Administered 2012-04-27: 3 [IU] via SUBCUTANEOUS
  Administered 2012-04-28: 2 [IU] via SUBCUTANEOUS

## 2012-04-27 MED ORDER — ALPRAZOLAM 0.25 MG PO TABS: 0.5000 mg | ORAL_TABLET | Freq: Three times a day (TID) | ORAL | Status: DC | PRN

## 2012-04-27 MED ORDER — ASPIRIN 325 MG PO TABS
325.0000 mg | ORAL_TABLET | Freq: Every day | ORAL | Status: DC
  Filled 2012-04-27 (×2): qty 1

## 2012-04-27 MED ORDER — HEPARIN (PORCINE) IN NACL 2-0.9 UNIT/ML-% IJ SOLN
INTRAMUSCULAR | Status: AC
  Filled 2012-04-27: qty 1000

## 2012-04-27 MED ORDER — ZOLPIDEM TARTRATE 5 MG PO TABS
10.0000 mg | ORAL_TABLET | Freq: Every evening | ORAL | Status: DC | PRN
  Administered 2012-04-27 – 2012-04-29 (×2): 10 mg via ORAL
  Filled 2012-04-27 (×2): qty 2

## 2012-04-27 MED ORDER — ASPIRIN 81 MG PO CHEW
324.0000 mg | CHEWABLE_TABLET | ORAL | Status: AC
  Administered 2012-04-28: 05:00:00 324 mg via ORAL
  Filled 2012-04-27: qty 4

## 2012-04-27 MED ORDER — INSULIN ASPART 100 UNIT/ML ~~LOC~~ SOLN
0.0000 [IU] | Freq: Three times a day (TID) | SUBCUTANEOUS | Status: DC
  Administered 2012-04-28 (×2): 11 [IU] via SUBCUTANEOUS
  Administered 2012-04-28: 19:00:00 4 [IU] via SUBCUTANEOUS
  Administered 2012-04-29: 7 [IU] via SUBCUTANEOUS
  Administered 2012-04-29: 09:00:00 11 [IU] via SUBCUTANEOUS

## 2012-04-27 MED ORDER — FENTANYL CITRATE 0.05 MG/ML IJ SOLN
INTRAMUSCULAR | Status: AC
  Filled 2012-04-27: qty 2

## 2012-04-27 MED ORDER — SODIUM CHLORIDE 0.9 % IV SOLN
1.0000 mL/kg/h | INTRAVENOUS | Status: DC
  Administered 2012-04-28 (×2): 1 mL/kg/h via INTRAVENOUS

## 2012-04-27 MED ORDER — HEPARIN SODIUM (PORCINE) 5000 UNIT/ML IJ SOLN: 5000.0000 [IU] | Freq: Three times a day (TID) | INTRAMUSCULAR | Status: DC

## 2012-04-27 MED ORDER — FUROSEMIDE 40 MG PO TABS
40.0000 mg | ORAL_TABLET | Freq: Every day | ORAL | Status: DC
  Administered 2012-04-27: 40 mg via ORAL
  Filled 2012-04-27 (×3): qty 1

## 2012-04-27 MED ORDER — FELODIPINE ER 5 MG PO TB24
5.0000 mg | ORAL_TABLET | Freq: Every day | ORAL | Status: DC
  Administered 2012-04-28 – 2012-04-30 (×3): 5 mg via ORAL
  Filled 2012-04-27 (×5): qty 1

## 2012-04-27 MED ORDER — SODIUM CHLORIDE 0.9 % IJ SOLN
3.0000 mL | Freq: Two times a day (BID) | INTRAMUSCULAR | Status: DC
  Administered 2012-04-27: 3 mL via INTRAVENOUS

## 2012-04-27 MED ORDER — PREGABALIN 75 MG PO CAPS: 75.0000 mg | ORAL_CAPSULE | Freq: Two times a day (BID) | ORAL | Status: DC

## 2012-04-27 MED ORDER — MIDAZOLAM HCL 2 MG/2ML IJ SOLN
INTRAMUSCULAR | Status: AC
  Filled 2012-04-27: qty 2

## 2012-04-27 MED ORDER — DIAZEPAM 5 MG PO TABS
5.0000 mg | ORAL_TABLET | ORAL | Status: AC
  Administered 2012-04-28: 15:00:00 5 mg via ORAL
  Filled 2012-04-27: qty 1

## 2012-04-27 MED ORDER — ACETAMINOPHEN 325 MG PO TABS: 650.0000 mg | ORAL_TABLET | ORAL | Status: DC | PRN

## 2012-04-27 MED ORDER — TICAGRELOR 90 MG PO TABS
180.0000 mg | ORAL_TABLET | ORAL | Status: AC
  Administered 2012-04-28: 05:00:00 180 mg via ORAL
  Filled 2012-04-27: qty 2

## 2012-04-27 MED ORDER — LISINOPRIL 20 MG PO TABS
20.0000 mg | ORAL_TABLET | Freq: Every day | ORAL | Status: DC
  Administered 2012-04-28 – 2012-04-29 (×2): 20 mg via ORAL
  Filled 2012-04-27 (×3): qty 1

## 2012-04-27 MED ORDER — SODIUM CHLORIDE 0.9 % IV SOLN: 1.0000 mL/kg/h | INTRAVENOUS | Status: AC

## 2012-04-27 MED ORDER — SODIUM CHLORIDE 0.9 % IJ SOLN: 3.0000 mL | INTRAMUSCULAR | Status: DC | PRN

## 2012-04-27 MED ORDER — FENOFIBRATE 160 MG PO TABS
160.0000 mg | ORAL_TABLET | Freq: Every day | ORAL | Status: DC
  Administered 2012-04-28 – 2012-04-30 (×3): 160 mg via ORAL
  Filled 2012-04-27 (×4): qty 1

## 2012-04-27 MED ORDER — ONDANSETRON HCL 4 MG/2ML IJ SOLN
4.0000 mg | Freq: Four times a day (QID) | INTRAMUSCULAR | Status: DC | PRN
  Administered 2012-04-28: 21:00:00 4 mg via INTRAVENOUS
  Filled 2012-04-27: qty 2

## 2012-04-27 MED ORDER — CYCLOBENZAPRINE HCL 10 MG PO TABS
10.0000 mg | ORAL_TABLET | Freq: Three times a day (TID) | ORAL | Status: DC | PRN
  Filled 2012-04-27: qty 1

## 2012-04-27 MED ORDER — PREGABALIN 25 MG PO CAPS
150.0000 mg | ORAL_CAPSULE | Freq: Every day | ORAL | Status: DC
  Administered 2012-04-27 – 2012-04-29 (×3): 150 mg via ORAL
  Filled 2012-04-27: qty 5
  Filled 2012-04-27 (×2): qty 6

## 2012-04-27 MED ORDER — GABAPENTIN 400 MG PO CAPS
1200.0000 mg | ORAL_CAPSULE | Freq: Every day | ORAL | Status: DC
  Administered 2012-04-27 – 2012-04-29 (×3): 1200 mg via ORAL
  Filled 2012-04-27 (×4): qty 3

## 2012-04-27 MED ORDER — LIDOCAINE HCL (PF) 1 % IJ SOLN
INTRAMUSCULAR | Status: AC
  Filled 2012-04-27: qty 30

## 2012-04-27 MED ORDER — SODIUM CHLORIDE 0.9 % IV SOLN: 250.0000 mL | INTRAVENOUS | Status: DC | PRN

## 2012-04-27 MED ORDER — PANTOPRAZOLE SODIUM 40 MG PO TBEC
40.0000 mg | DELAYED_RELEASE_TABLET | Freq: Every day | ORAL | Status: DC
  Administered 2012-04-27 – 2012-04-30 (×4): 40 mg via ORAL
  Filled 2012-04-27 (×4): qty 1

## 2012-04-27 MED ORDER — VITAMIN D3 25 MCG (1000 UNIT) PO TABS
1000.0000 [IU] | ORAL_TABLET | Freq: Four times a day (QID) | ORAL | Status: DC
  Administered 2012-04-28 – 2012-04-30 (×9): 1000 [IU] via ORAL
  Filled 2012-04-27 (×15): qty 1

## 2012-04-27 MED ORDER — LEVOTHYROXINE SODIUM 75 MCG PO TABS
75.0000 ug | ORAL_TABLET | Freq: Every day | ORAL | Status: DC
  Administered 2012-04-28 – 2012-04-30 (×3): 75 ug via ORAL
  Filled 2012-04-27 (×5): qty 1

## 2012-04-27 MED ORDER — PREGABALIN 25 MG PO CAPS
75.0000 mg | ORAL_CAPSULE | Freq: Every day | ORAL | Status: DC
  Administered 2012-04-28 – 2012-04-30 (×3): 75 mg via ORAL
  Filled 2012-04-27 (×2): qty 3
  Filled 2012-04-27: qty 1
  Filled 2012-04-27: qty 3

## 2012-04-27 MED ORDER — ATENOLOL 25 MG PO TABS
25.0000 mg | ORAL_TABLET | Freq: Every evening | ORAL | Status: DC
  Administered 2012-04-27 – 2012-04-29 (×2): 25 mg via ORAL
  Filled 2012-04-27 (×4): qty 1

## 2012-04-27 MED ORDER — ESCITALOPRAM OXALATE 20 MG PO TABS
20.0000 mg | ORAL_TABLET | Freq: Every day | ORAL | Status: DC
  Administered 2012-04-28 – 2012-04-30 (×3): 20 mg via ORAL
  Filled 2012-04-27 (×4): qty 1

## 2012-04-27 MED ORDER — SODIUM CHLORIDE 0.9 % IV SOLN
INTRAVENOUS | Status: DC
  Administered 2012-04-27: 08:00:00 via INTRAVENOUS

## 2012-04-27 MED ORDER — ISOSORBIDE MONONITRATE ER 30 MG PO TB24
30.0000 mg | ORAL_TABLET | Freq: Every day | ORAL | Status: DC
  Administered 2012-04-27 – 2012-04-28 (×2): 30 mg via ORAL
  Filled 2012-04-27 (×2): qty 1

## 2012-04-27 MED ORDER — SIMVASTATIN 40 MG PO TABS: 40.0000 mg | ORAL_TABLET | Freq: Every evening | ORAL | Status: DC

## 2012-04-27 MED ORDER — HEPARIN SODIUM (PORCINE) 1000 UNIT/ML IJ SOLN
INTRAMUSCULAR | Status: AC
  Filled 2012-04-27: qty 1

## 2012-04-27 NOTE — Progress Notes (Signed)
Pre-op Cardiac Surgery  Carotid Findings:  The right internal carotid artery demonstrates elevated velocities, however there is no evidence of significant plaque morphology to support stenosis. The left internal carotid artery demonstrates elevated velocities suggestive of 40-59% stenosis. The left external carotid artery demonstrates elevated velocities, suggestive of stenosis. Vertebral arteries are patent with antegrade flow.   Upper Extremity Right Left  Brachial Pressures 121-Triphasic 139-Triphasic  Radial Waveforms Triphasic Triphasic  Ulnar Waveforms Triphasic Triphasic  Palmar Arch (Allen's Test) Within normal limits Signal obliterates with radial compression, decreases <50% with ulnar compression.     Lower  Extremity Right Left  Dorsalis Pedis 104-Triphasic 69-Monophasic  Anterior Tibial    Posterior Tibial 123-Triphasic 128-Triphasic  Ankle/Brachial Indices 0.88 0.92    Findings:   ABIs are suggestive of mild arterial insufficiency bilaterally.   04/27/2012 5:20 PM Maudry Mayhew, RDMS, RDCS

## 2012-04-27 NOTE — Progress Notes (Signed)
TR BAND REMOVAL  LOCATION:    right radial  DEFLATED PER PROTOCOL:    yes  TIME BAND OFF / DRESSING APPLIED:    1530   SITE UPON ARRIVAL:    Level 0  SITE AFTER BAND REMOVAL:    Level 0  REVERSE ALLEN'S TEST:     positive  CIRCULATION SENSATION AND MOVEMENT:    Within Normal Limits   yes  COMMENTS:   Gauze dressing secured with tegaderm applied, patient had bleeding x1 during deflation. No further problems after bleeding.

## 2012-04-27 NOTE — Progress Notes (Signed)
Transferred to vascular lab for pre CABG testing, off monitor per Dr. Allison Quarry order.

## 2012-04-27 NOTE — Progress Notes (Signed)
Patients CBG taken by nurse tech at [facility] after dinner eaten. Unable to cover for 346 CBG under current sliding scale.

## 2012-04-27 NOTE — Brief Op Note (Signed)
04/27/2012  12:21 PM  PATIENT:  Harold [name]  57 y.o. male with HTN, HLD, DM-2 on insulin, morbid obesity & OSA with known CAD (see H&P) presented for invasive cardiac evaluation for progressively worsening DOE.  PRE-OPERATIVE DIAGNOSIS:  Chest pain; dyspnea on exertion  POST-OPERATIVE DIAGNOSIS:    Severe diffuse CAD of RCA (prox 70%, mid tandem 90%, distal 70%) with focal mid LAD & Cx 80-90% stenoses  Preserved EF with EDP elevated   PROCEDURE:  Procedure(s): LEFT AND RIGHT HEART CATHETERIZATION WITH CORONARY ANGIOGRAM (N/A)  SURGEON:  Surgeon(s) and Role:    * Leonie Man, MD - Primary  ANESTHESIA:   local and IV sedation; 82m Versed, 50 mcg Fentanul  EBL:    < 177m EQUIPMENT: 5 Fr Radial Sheath, TIG 4.0 RCA, EBU 3.5- LCA, Angled Pigtail    LOCAL MEDICATIONS USED:  LIDOCAINE 2 ml  TR:  15 ml air, 1200 - non-occlusive hemostasis.  DICTATION: .Note written in paper chart and Note written in EPIC  PLAN OF CARE: Admit for overnight observation; CVTS Consult  Add Ranexa & Imdur  Will need to increase diuresis  SSI for now => pending plan, will need to adjust DM care  PATIENT DISPOSITION:  PACU - hemodynamically stable.   Delay start of Pharmacological VTE agent (6hrs) due to risk of bleeding: yes  HARDING,DAVID W, M.D., M.S. [facility] [address]  [phone] # [phone]/11/2012 12:26 PM

## 2012-04-28 ENCOUNTER — Encounter (HOSPITAL_COMMUNITY)
Admission: RE | Disposition: A | Discharge status: Home / Self Care | Payer: Self-pay | Source: Ambulatory Visit | Attending: Cardiology

## 2012-04-28 ENCOUNTER — Encounter (HOSPITAL_COMMUNITY): Payer: PRIVATE HEALTH INSURANCE

## 2012-04-28 DIAGNOSIS — I2510 Atherosclerotic heart disease of native coronary artery without angina pectoris: Secondary | ICD-10-CM

## 2012-04-28 HISTORY — PX: PERCUTANEOUS CORONARY STENT INTERVENTION (PCI-S): SHX5485

## 2012-04-28 HISTORY — PX: CORONARY STENT PLACEMENT: SHX1402

## 2012-04-28 HISTORY — PX: DOPPLER ECHOCARDIOGRAPHY: SHX263

## 2012-04-28 LAB — GLUCOSE, CAPILLARY
Glucose-Capillary: 252 mg/dL — ABNORMAL HIGH (ref 70–99)
Glucose-Capillary: 189 mg/dL — ABNORMAL HIGH (ref 70–99)
Glucose-Capillary: 212 mg/dL — ABNORMAL HIGH (ref 70–99)

## 2012-04-28 LAB — BASIC METABOLIC PANEL
Calcium: 9.3 mg/dL (ref 8.4–10.5)
Chloride: 98 mEq/L (ref 96–112)
Creatinine, Ser: 1.34 mg/dL (ref 0.50–1.35)
GFR calc Af Amer: 66 mL/min — ABNORMAL LOW (ref >90)
GFR calc non Af Amer: 57 mL/min — ABNORMAL LOW (ref >90)

## 2012-04-28 LAB — POCT ACTIVATED CLOTTING TIME: Activated Clotting Time: 595 seconds

## 2012-04-28 LAB — CBC
MCHC: 36 g/dL (ref 30.0–36.0)
MCV: 86.3 fL (ref 78.0–100.0)
Platelets: 123 10*3/uL — ABNORMAL LOW (ref 150–400)
RDW: 13.9 % (ref 11.5–15.5)
WBC: 4.5 10*3/uL (ref 4.0–10.5)

## 2012-04-28 SURGERY — PERCUTANEOUS CORONARY STENT INTERVENTION (PCI-S)
Anesthesia: LOCAL

## 2012-04-28 MED ORDER — FENTANYL CITRATE 0.05 MG/ML IJ SOLN
INTRAMUSCULAR | Status: AC
  Filled 2012-04-28: qty 2

## 2012-04-28 MED ORDER — MORPHINE SULFATE 2 MG/ML IJ SOLN
2.0000 mg | INTRAMUSCULAR | Status: DC | PRN
  Administered 2012-04-28: 21:00:00 2 mg via INTRAVENOUS

## 2012-04-28 MED ORDER — TICAGRELOR 90 MG PO TABS
ORAL_TABLET | ORAL | Status: AC
  Administered 2012-04-29: 90 mg via ORAL
  Filled 2012-04-28: qty 2

## 2012-04-28 MED ORDER — HEPARIN (PORCINE) IN NACL 2-0.9 UNIT/ML-% IJ SOLN
INTRAMUSCULAR | Status: AC
  Filled 2012-04-28: qty 500

## 2012-04-28 MED ORDER — LIDOCAINE HCL (PF) 1 % IJ SOLN
INTRAMUSCULAR | Status: AC
  Filled 2012-04-28: qty 30

## 2012-04-28 MED ORDER — MIDAZOLAM HCL 2 MG/2ML IJ SOLN
INTRAMUSCULAR | Status: AC
  Filled 2012-04-28: qty 2

## 2012-04-28 MED ORDER — TICAGRELOR 90 MG PO TABS
90.0000 mg | ORAL_TABLET | Freq: Two times a day (BID) | ORAL | Status: DC
  Administered 2012-04-28 – 2012-04-30 (×3): 90 mg via ORAL
  Filled 2012-04-28 (×5): qty 1

## 2012-04-28 MED ORDER — HEPARIN (PORCINE) IN NACL 2-0.9 UNIT/ML-% IJ SOLN
INTRAMUSCULAR | Status: AC
  Filled 2012-04-28: qty 1000

## 2012-04-28 MED ORDER — NITROGLYCERIN IN D5W 200-5 MCG/ML-% IV SOLN: 2.0000 ug/min | INTRAVENOUS | Status: DC

## 2012-04-28 MED ORDER — ISOSORBIDE MONONITRATE ER 60 MG PO TB24
60.0000 mg | ORAL_TABLET | Freq: Every day | ORAL | Status: DC
  Administered 2012-04-29 – 2012-04-30 (×2): 60 mg via ORAL
  Filled 2012-04-28 (×2): qty 1

## 2012-04-28 MED ORDER — MORPHINE SULFATE 2 MG/ML IJ SOLN
INTRAMUSCULAR | Status: AC
  Filled 2012-04-28: qty 1

## 2012-04-28 MED ORDER — ACETAMINOPHEN 325 MG PO TABS: 650.0000 mg | ORAL_TABLET | ORAL | Status: DC | PRN

## 2012-04-28 MED ORDER — NITROGLYCERIN IN D5W 200-5 MCG/ML-% IV SOLN
INTRAVENOUS | Status: AC
  Filled 2012-04-28: qty 250

## 2012-04-28 MED ORDER — BIVALIRUDIN 250 MG IV SOLR
INTRAVENOUS | Status: AC
  Filled 2012-04-28: qty 250

## 2012-04-28 MED ORDER — ASPIRIN EC 81 MG PO TBEC
81.0000 mg | DELAYED_RELEASE_TABLET | Freq: Every day | ORAL | Status: DC
  Administered 2012-04-29 – 2012-04-30 (×2): 81 mg via ORAL
  Filled 2012-04-28 (×2): qty 1

## 2012-04-28 MED ORDER — SODIUM CHLORIDE 0.9 % IV SOLN
INTRAVENOUS | Status: DC
  Administered 2012-04-28: 21:00:00 via INTRAVENOUS

## 2012-04-28 MED ORDER — ONDANSETRON HCL 4 MG/2ML IJ SOLN: 4.0000 mg | Freq: Four times a day (QID) | INTRAMUSCULAR | Status: DC | PRN

## 2012-04-28 MED ORDER — INSULIN GLARGINE 100 UNIT/ML ~~LOC~~ SOLN
7.0000 [IU] | Freq: Once | SUBCUTANEOUS | Status: AC
  Administered 2012-04-28: 7 [IU] via SUBCUTANEOUS

## 2012-04-28 MED ORDER — NITROGLYCERIN 1 MG/10 ML FOR IR/CATH LAB
INTRA_ARTERIAL | Status: AC
  Filled 2012-04-28: qty 10

## 2012-04-28 MED ORDER — SODIUM CHLORIDE 0.9 % IV SOLN
1.7500 mg/kg/h | INTRAVENOUS | Status: DC
  Filled 2012-04-28: qty 250

## 2012-04-28 NOTE — Consult Note (Signed)
[address]            ,[place] [address]          [phone]       Harold [name] [place] Medical Record #[id number] Date of Birth: 1955-09-13  No ref. Ayodele Hartsock found DAUB, Lina Sayre, MD  Chief Complaint:   No chief complaint on file.  chest pain, exercise intolerance, dyspnea on exertion  History of Present Illness:     57 year old morbidly obese diabetic reformed smoker with history of CAD status post PTCA 10 years ago. He now presents with exertional symptoms of shortness of breath numbness in his arm and upper abdominal discomfort. Cardiac catheterization by Dr. Ellyn Hack demonstrated significant three-vessel CAD with preserved LV function. Evaluation for CABG was requested. Patient is currently stable without chest discomfort or symptoms in the step down   Current Activity/ Functional Status: Not able to tolerate much exertion due to shortness of breath   Past Medical History  Diagnosis Date  . Diabetes mellitus   . Peripheral vascular disease   . Carotid artery occlusion   . Complication of anesthesia   . PONV (postoperative nausea and vomiting)     "Patch Works"  . Shortness of breath     with activiy  . Hypothyroidism   . Depression     situaltional   . Sleep apnea   . Neuropathy   . CAD (coronary artery disease), native coronary artery 2003    3 vessel disease: Apical LAD subtotal occlusion, proximal OM 2 occlusion with retrograde flow, diffuse severe circumflex stenoses treated with PTCA alone with a 2.0 x 20 mm balloon; normal EF by echo  . Morbid obesity with BMI of 40.0-44.9, adult     BMI 42  . Dyslipidemia associated with type 2 diabetes mellitus   . GERD (gastroesophageal reflux disease)     Past Surgical History  Procedure Laterality Date  . Umbilical hernia repair  2009    steel mesh insert  . Cardiac catheterization      Ballon and added Atenol  . Colonscopy    . Carotid endarterectomy  2005    Right  .  Back surgery  2005 x1    X2-2010  . Lumbar laminectomy/decompression microdiscectomy  01/07/2012    Procedure: LUMBAR LAMINECTOMY/DECOMPRESSION MICRODISCECTOMY 1 LEVEL;  Surgeon: Winfield Cunas, MD;  Location: [place] [facility];  Service: Neurosurgery;  Laterality: Bilateral;   Lumbar Three-Four Decompression    History  Smoking status  . Former Smoker -- 64 years  . Quit date: 03/23/2003  Smokeless tobacco  . Never Used    History  Alcohol Use No    History   Social History  . Marital Status: Single    Spouse Name: N/A    Number of Children: N/A  . Years of Education: N/A   Occupational History  . Not on file.   Social History Main Topics  . Smoking status: Former Smoker -- 35 years    Quit date: 03/23/2003  . Smokeless tobacco: Never Used  . Alcohol Use: No  . Drug Use: No  . Sexually Active: Not on file   Other Topics Concern  . Not on file   Social History Narrative  . No narrative on file    Allergies  Allergen Reactions  . Septra (Bactrim) Itching  . Penicillins Hives    Allergy to All  cillin drugs  . Other     Walnuts, pecans     Current Facility-Administered Medications  Medication Dose Route Frequency Adam Sanjuan Last Rate Last Dose  . 0.9 %  sodium chloride infusion  1 mL/kg/hr Intravenous Continuous Leonie Man, MD 148.8 mL/hr at 04/28/12 0509 1 mL/kg/hr at 04/28/12 0509  . acetaminophen (TYLENOL) tablet 650 mg  650 mg Oral Q4H PRN Leonie Man, MD      . ALPRAZolam Duanne Moron) tablet 0.5 mg  0.5 mg Oral TID PRN Leonie Man, MD      . aspirin tablet 325 mg  325 mg Oral Daily Leonie Man, MD      . atenolol (TENORMIN) tablet 25 mg  25 mg Oral QPM Leonie Man, MD   25 mg at 04/27/12 1830  . atorvastatin (LIPITOR) tablet 20 mg  20 mg Oral q1800 Leonie Man, MD   20 mg at 04/27/12 1830  . cholecalciferol (VITAMIN D) tablet 1,000 Units  1,000 Units Oral QID Leonie Man, MD      . cyclobenzaprine (FLEXERIL) tablet 10 mg  10 mg Oral TID  PRN Leonie Man, MD      . diazepam (VALIUM) tablet 5 mg  5 mg Oral On Call Leonie Man, MD      . escitalopram Loma Sousa) tablet 20 mg  20 mg Oral Daily Leonie Man, MD      . felodipine (PLENDIL) 24 hr tablet 5 mg  5 mg Oral Daily Leonie Man, MD      . fenofibrate tablet 160 mg  160 mg Oral Daily Leonie Man, MD      . furosemide (LASIX) tablet 40 mg  40 mg Oral Daily Leonie Man, MD   40 mg at 04/27/12 1830  . gabapentin (NEURONTIN) capsule 1,200 mg  1,200 mg Oral QHS Leonie Man, MD   1,200 mg at 04/27/12 2127  . gabapentin (NEURONTIN) capsule 600 mg  600 mg Oral BID WC Leonie Man, MD   600 mg at 04/27/12 1800  . insulin aspart (novoLOG) injection 0-20 Units  0-20 Units Subcutaneous TID WC Leonie Man, MD      . insulin aspart (novoLOG) injection 0-5 Units  0-5 Units Subcutaneous QHS Leonie Man, MD   3 Units at 04/27/12 2250  . insulin glargine (LANTUS) injection 14 Units  14 Units Subcutaneous BID Ivin Poot, MD   14 Units at 04/27/12 2250  . isosorbide mononitrate (IMDUR) 24 hr tablet 30 mg  30 mg Oral Daily Leonie Man, MD   30 mg at 04/27/12 1830  . levothyroxine (SYNTHROID, LEVOTHROID) tablet 75 mcg  75 mcg Oral QAC breakfast Leonie Man, MD   75 mcg at 04/28/12 0518  . lisinopril (PRINIVIL,ZESTRIL) tablet 20 mg  20 mg Oral Daily Leonie Man, MD      . ondansetron [facility]) injection 4 mg  4 mg Intravenous Q6H PRN Leonie Man, MD      . pantoprazole (PROTONIX) EC tablet 40 mg  40 mg Oral Daily Leonie Man, MD   40 mg at 04/27/12 1830  . pregabalin (LYRICA) capsule 150 mg  150 mg Oral QHS Leonie Man, MD   150 mg at 04/27/12 2132  . pregabalin (LYRICA) capsule 75 mg  75 mg Oral Daily Leonie Man, MD      . ranolazine [facility]) 12 hr tablet 500 mg  500 mg Oral BID  Leonie Man, MD   500 mg at 04/27/12 1900  . zolpidem (AMBIEN) tablet 10 mg  10 mg Oral QHS PRN Leonie Man, MD   10 mg at 04/27/12 2251      History reviewed. No pertinent family history.   Review of Systems: History of peripheral vascular disease status post right carotid endarterectomy and with ABIs 0.8 bilaterally    Cardiac Review of Systems: Y or N  Chest Pain [  Y.  ]  Resting SOB [ N.  ] Exertional SOB  [ Y. ]  Orthopnea [ Y. ]   Pedal Edema [ N.  ]    Palpitations [Y.  ] Syncope  [N.  ] in  Presyncope [   ]  General Review of Systems: [Y] = yes [  ]=no Constitional: recent weight change [  ]; anorexia [  ]; fatigue [  ]; nausea [  ]; night sweats [  ]; fever [  ]; or chills [  ];                                                                                                                                          Dental: poor dentition[  ]; Last Dentist visit: 1 year  Eye : blurred vision [  ]; diplopia [   ]; vision changes [  ];  Amaurosis fugax[  ]; Resp: cough [  ];  wheezing[  ];  hemoptysis[  ]; shortness of breath[  ]; paroxysmal nocturnal dyspnea[  ]; dyspnea on exertion[Y.  ]; or orthopnea[  ];  GI:  gallstones[  ], vomiting[  ];  dysphagia[  ]; melena[  ];  hematochezia [  ]; heartburn[  ];   Hx of  Colonoscopy[  ]; GU: kidney stones [  ]; hematuria[  ];   dysuria [  ];  nocturia[  ];  history of     obstruction [  ];                 Skin: rash, swelling[  ];, hair loss[  ];  peripheral edema[  ];  or itching[  ]; Musculosketetal: myalgias[  ];  joint swelling[  ];  joint erythema[  ];  joint pain[  ];  back pain[ Y. ];  Heme/Lymph: bruising[  ];  bleeding[  ];  anemia[  ];  Neuro: TIA[  ];  headaches[  ];  stroke[  ];  vertigo[  ];  seizures[  ];   paresthesias[  ];  difficulty walking[  ];  Psych:depression[  ]; anxiety[  ];  Endocrine: diabetes[Y.  ];  thyroid dysfunction[  ];  Immunizations: Flu [  ]; Pneumococcal[  ];  Other:  Physical Exam: BP 140/59  Pulse 49  Temp(Src) 97.9 F (36.6 C) (Oral)  Resp 18  Ht _0  (1.854 m)  Wt 315 lb 4.1 oz (143 kg)  BMI 41.6 kg/m2  SpO2  96%  Gen.-Middle-aged obese Caucasian male no acute distress HEENT-normocephalic dentition good Neck-carotid endarterectomy scar no JVD or mass Thorax-diminished breath sounds no tenderness or deformity Cardiac-regular rhythm without murmur or gallop Abdomen-soft obese nontender Extremities-no clubbing or cyanosis Vascular palpable upper extremity  pulses, right wrist with dressing after cardiac cath nonpalpable pedal pulses Neuro-alert responsive no focal motor deficit   Diagnostic Studies & Laboratory data:   Cardiac cath reviewed showing multivessel CAD, potentially graftable vessels  Recent Radiology Findings:   No results found.  Chest x-ray shows no active disease  Recent Lab Findings: Lab Results  Component Value Date   WBC 4.7 04/27/2012   HGB 14.0 04/27/2012   HCT 38.9* 04/27/2012   PLT 138* 04/27/2012   GLUCOSE 143* 01/06/2012   ALT 49 11/13/2011   AST 43* 11/13/2011   NA 136 01/06/2012   K 4.2 01/06/2012   CL 96 01/06/2012   CREATININE 1.49* 04/27/2012   BUN 20 01/06/2012   CO2 30 01/06/2012   HGBA1C 8.0* 01/08/2012      Assessment / Plan:      57 year old morbidly obese diabetic reformed smoker with three-vessel CAD. I've discussed surgical coronary revascularization as the preferred long-term therapy for three-vessel disease in a diabetic with graftable vessels. I discussed the major aspects of surgery, the potential risks and the expected postoperative recovery. He will consider his options of PCI versus CABG but at this time is leaning towards PCI for current therapy.

## 2012-04-28 NOTE — Progress Notes (Signed)
  Echocardiogram 2D Echocardiogram has been performed.  Harold [name], [name] 04/28/2012, 9:30 AM

## 2012-04-28 NOTE — CV Procedure (Signed)
Two vessel PCI to LAD and LCX  Harold [name], 57 y.o., male  Full not dictated; see diagram in chart  DICTATION # , Epic  [id number] Angiomax, 180 mg brilinta, IV and IC NTG  LAD: 70 and 80% prox to mid stenosis before and after Dx to 0 2.0 x 12 Emerge, 3.0 x 23 Xience DES stent post dilated to 3.25 with 3.25 [place] Sprinter  LCX: prox 80% and mid 90% to 0 with 2.0 x12 Emerge, 2.5 x 12 x2 Xience DES stents post dilated to 2.75 mm with 2.75 [place] Sprinter  Attempt to cross total OM with Luge and Choice PT and 1.5 x 12 balloon, balloon never able to cross.  Tolerated well. Hydrate and plan staged PCI to RCA.  Troy Sine, MD, [facility] 04/28/2012 6:01 PM

## 2012-04-28 NOTE — Progress Notes (Signed)
[facility]  Subjective: No CP or SOB  Objective: Vital signs in last 24 hours: Temp:  [97.2 F (36.2 C)-97.9 F (36.6 C)] 97.9 F (36.6 C) (03/11 0731) Pulse Rate:  [49-60] 49 (03/11 0731) Resp:  [18] 18 (03/10 1330) BP: (99-143)/(49-71) 140/59 mmHg (03/11 0514) SpO2:  [92 %-98 %] 96 % (03/11 0731) Weight:  [143 kg (315 lb 4.1 oz)] 143 kg (315 lb 4.1 oz) (03/11 0001) Last BM Date: 04/27/12  Intake/Output from previous day: 03/10 0701 - 03/11 0700 In: 480 [P.O.:480] Out: 1800 [Urine:1800] Intake/Output this shift:    Medications Current Facility-Administered Medications  Medication Dose Route Frequency Provider Last Rate Last Dose  . 0.9 %  sodium chloride infusion  1 mL/kg/hr Intravenous Continuous Leonie Man, MD 148.8 mL/hr at 04/28/12 0509 1 mL/kg/hr at 04/28/12 0509  . acetaminophen (TYLENOL) tablet 650 mg  650 mg Oral Q4H PRN Leonie Man, MD      . ALPRAZolam Duanne Moron) tablet 0.5 mg  0.5 mg Oral TID PRN Leonie Man, MD      . aspirin tablet 325 mg  325 mg Oral Daily Leonie Man, MD      . atenolol (TENORMIN) tablet 25 mg  25 mg Oral QPM Leonie Man, MD   25 mg at 04/27/12 1830  . atorvastatin (LIPITOR) tablet 20 mg  20 mg Oral q1800 Leonie Man, MD   20 mg at 04/27/12 1830  . cholecalciferol (VITAMIN D) tablet 1,000 Units  1,000 Units Oral QID Leonie Man, MD      . cyclobenzaprine (FLEXERIL) tablet 10 mg  10 mg Oral TID PRN Leonie Man, MD      . diazepam (VALIUM) tablet 5 mg  5 mg Oral On Call Leonie Man, MD      . escitalopram Loma Sousa) tablet 20 mg  20 mg Oral Daily Leonie Man, MD      . felodipine (PLENDIL) 24 hr tablet 5 mg  5 mg Oral Daily Leonie Man, MD      . fenofibrate tablet 160 mg  160 mg Oral Daily Leonie Man, MD      . furosemide (LASIX) tablet 40 mg  40 mg Oral Daily Leonie Man, MD   40 mg at 04/27/12 1830  . gabapentin (NEURONTIN) capsule 1,200 mg  1,200 mg Oral QHS  Leonie Man, MD   1,200 mg at 04/27/12 2127  . gabapentin (NEURONTIN) capsule 600 mg  600 mg Oral BID WC Leonie Man, MD   600 mg at 04/28/12 0830  . insulin aspart (novoLOG) injection 0-20 Units  0-20 Units Subcutaneous TID WC Leonie Man, MD   11 Units at 04/28/12 0800  . insulin aspart (novoLOG) injection 0-5 Units  0-5 Units Subcutaneous QHS Leonie Man, MD   3 Units at 04/27/12 2250  . insulin glargine (LANTUS) injection 14 Units  14 Units Subcutaneous BID Ivin Poot, MD   14 Units at 04/27/12 2250  . insulin glargine (LANTUS) injection 7 Units  7 Units Subcutaneous Once Tarri Fuller, PA-C      . isosorbide mononitrate (IMDUR) 24 hr tablet 30 mg  30 mg Oral Daily Leonie Man, MD   30 mg at 04/27/12 1830  . levothyroxine (SYNTHROID, LEVOTHROID) tablet 75 mcg  75 mcg Oral QAC breakfast Leonie Man, MD   75 mcg at 04/28/12 0518  . lisinopril (PRINIVIL,ZESTRIL) tablet 20 mg  20 mg Oral Daily Leonie Man, MD   20 mg at 04/28/12 0946  . ondansetron (ZOFRAN) injection 4 mg  4 mg Intravenous Q6H PRN Leonie Man, MD      . pantoprazole (PROTONIX) EC tablet 40 mg  40 mg Oral Daily Leonie Man, MD   40 mg at 04/28/12 0947  . pregabalin (LYRICA) capsule 150 mg  150 mg Oral QHS Leonie Man, MD   150 mg at 04/27/12 2132  . pregabalin (LYRICA) capsule 75 mg  75 mg Oral Daily Leonie Man, MD   75 mg at 04/28/12 0947  . ranolazine (RANEXA) 12 hr tablet 500 mg  500 mg Oral BID Leonie Man, MD   500 mg at 04/28/12 0946  . zolpidem (AMBIEN) tablet 10 mg  10 mg Oral QHS PRN Leonie Man, MD   10 mg at 04/27/12 2251    PE: General appearance: alert, cooperative and no distress Lungs: clear to auscultation bilaterally Heart: regular rate and rhythm, S1, S2 normal, no murmur, click, rub or gallop Extremities: Trace LEE Pulses: 2+ and symmetric Skin: Right wrist: nontender, no ecchymosis Neurologic: Grossly normal  Lab Results:   Recent Labs   04/27/12 1529 04/28/12 0848  WBC 4.7 4.5  HGB 14.0 14.0  HCT 38.9* 38.9*  PLT 138* 123*   BMET  Recent Labs  04/27/12 1529  CREATININE 1.49*   Hemodynamics:  Central Aortic Pressure / Mean Aortic Pressure: 111/57 mmHg; 77 mmHg  LV Pressure / LV End diastolic Pressure: 811/57 mmHg; 23 mmHg  Left Ventriculography:  EF: 55-60  Wall Motion: normal  Coronary Angiographic Data:  Left Main: Large caliber, mildly calcified vessel that bifurcates into the LAD and Circumflex; angiographically normal. Left Anterior Descending (LAD): Large caliber, wrap around vessel with a focal ~80% eccentric lesion that is bordered by a ~50-60% irregular segment just after D1.  There are several small caliber diagonal branches.  The distal LAD is sub-totally occluded, as previously noted. It does, however, re-constitute to perfuse the apex. Circumflex (LCx): Large caliber vessel with a moderate caliber OM1, then a ~60% irregular lesion before the proximally occluded OM2. Distally, there is a focal ~80-90% lesion, before it terminates as a inferolateral OM3. Besides the obvious focal lesions, the vessel itself is diffusely diseased.  1st obtuse marginal: Moderate caliber vessel with diffuse luminal irregularities.  2nd obtuse marginal: Known proximal occlusion with L-L collaterals that back-fill the distal vessel up to the occlusion. 3rd obtuse marginal: Moderate caliber vessel with diffuse luminal irregularities.  Right Coronary Artery: Moderate to large caliber, dominant vessel with an ostial ~70% lesion followed by diffuse mild disease until two tandem ~90-95% lesions in the mid vessel around the RV marginal. After a segment without significant disease, there is a 70-80% lesion proximal to the bifurcation into the rPDA and Right Posterior AV Groove branch (RPAV)  RPDA: small to moderate caliber, diffusely diseased branch with a mid ~60% lesion. It reaches ~1/2 way to the apex. Impression:  Severe  multivessel disease involving an extensively diseased RCA with at multiple severe lesions along with relatively focal lesions in the LAD and mid-distal Circumflex.  Preserved EF with mildly elevated LVEDP Plan:  He is a diabetic with multiple risk factors, and has clearly been having progressively worsening dyspnea on exertion and now also notes arm numbness. I am now certain that these symptoms are consistent with progressive/unstable angina, and I do not feel comfortable not proceeding with a plan for  complete revascularization. With the extent of his CAD, I feel that his best option for revascularization is CABG.  I have consulted CVTS, and he will be seen this evening. He would like to hear both options of PCI and CABG. I will place him in Observation status, to allow him to make a decision.  I have added Imdur and Ranexa. No room to up-titrate BB dose, as he has been bradycardic.  He will need increased diuretics, given the elevated LVEDP.  Continue DM therapy with Insulin. The case and results was discussed with the patient ( and family).  Time Spend Directly with Patient:  49 minutes  HARDING,DAVID W, M.D., M.S.  [facility]  Assessment/Plan  Principal Problem:   CAD (coronary artery disease), native coronary artery Active Problems:   Diabetes mellitus   Hypertension   Carotid arterial disease   Hyperlipidemia   Dyspnea on exertion -- associated with left arm numbness; concern for angina equivalent   thrombocytopenia  Plan:  Doing well.  Severe three vessel CAD. CTS consult completed.  Patient wants PCI which will need to be staged..  Dr. Claiborne Billings to evaluate.  2D echo completed.  Read pending.  Bradycardia on tele. 49-60.   BP stable.     LOS: 1 day    HAGER, BRYAN 04/28/2012 10:07 AM   Patient seen and examined. Agree with assessment and plan. Cines reviewed; options discussed with patient. Pt prefers PCI strategy.Will plan PCI today to left  coronary system of LAD and LCX vs RCA with planned staged intervention to other not done today. Discussed need for long term DAP.   Troy Sine, MD, [facility] 04/28/2012 2:22 PM

## 2012-04-28 NOTE — Progress Notes (Signed)
I was notified by Dina Rich, RN that [name] has decided that he does not want to pursue CABG, after Dr. Prescott Gum graciously saw him in consultation.  He has chosen to pursue PCI.  I came to see the patient this evening to discuss his decision with him and his partner.  He is concerned about the time of recover and potential complications of CABG.  He understands that PCI will require extensive stent placement, and means Dual Antiplatelet Therapy for an indefinite period.  I have talked with Dr. Claiborne Billings, who is our Interventional Cardiologist tomorrow and explained the situation.  He will review the angiographic images and decide if he will opt for PCI of the LAD & Circumflex lesions or the multiple RCA lesions tomorrow.  I will be here on Wednesday to complete the PCI revascularization of the remaining lesions.  Leonie Man, M.D., M.S. [facility] [address] [place], [place]  [address]  [phone] Pager # [phone] 04/28/2012 12:13 AM

## 2012-04-28 NOTE — CV Procedure (Addendum)
[facility] CARDIAC CATHETERIZATION REPORT  NAME: Harold [name]   MRN: [id number] DOB: 12-09-1955   ADMIT DATE:  04/27/2012 Procedure Date: 04/27/2012  Performing Cardiologist: Leonie Man, M.D., MS Primary Physician: [name], MD Primary Cardiologist:  Leonie Man, M.D., MS  Procedures Performed:  Left Heart Catheterization via 5 Fr Right Radial Artery access  Left Ventriculography, (RAO) 10 ml/sec for 30 ml total contrast  Native Coronary Angiography  Indication(s): Progressively worsening shortness of breath on exertion -- concerning for Progressive / Crescendo Angina Equivalent  Diabetes Type II - on Insulin  Morbid obesity  History: 57 y.o. male with a detailed past medical history as noted below. He was last seen on March 5 at [facility] to followup from [place] which did not reveal significant venous insufficiency. However in this interim from January to March she's been noticing worsening exertional shortness of breath that is occasionally associated with left arm numbness. It is gone from reducing his at general activity level with significant insertion to now being which is to be walking around the house. This is quite similar to the symptoms he had in 2003 when he underwent angioplasty to the circumflex. Due to this concern and his body habitus with a likely difficult nuclear stress testing. I was concerned this could be due to an unstable angina/crescendo angina equivalent process. We discussed the options and agreed the best option would be to proceed with cardiac catheterization.   Consent: The procedure with Risks/Benefits/Alternatives and Indications was reviewed with the patient and family.  All questions were answered.    Consent for signed by MD and patient with RN witness -- placed on chart.  Procedure: The patient was brought to the 2nd [place] [facility] Lab in the  fasting state and prepped and draped in the usual sterile fashion for Right groin or radial) access.  A modified Allen's test with plethysmography was performed on the right wrist demonstrating adequate Ulnar Artery collateral flow).    Sterile technique was used including antiseptics, cap, gloves, gown, hand hygiene, mask and sheet.  Skin prep: Chlorhexidine;  Time Out: Verified patient identification, verified procedure, site/side was marked, verified correct patient position, special equipment/implants available, medications/allergies/relevent history reviewed, required imaging and test results available.  Performed  The right wrist was anesthetized with 1% subcutaneous Lidocaine.  The right radial artery was accessed using the Seldinger Technique with placement of a 5 Fr Glide Sheath. The sheath was aspirated and flushed.  Then a total of 10 ml of standard Radial Artery Cocktail (see medications) was infused.  Radial Cocktail: 5 mg Verapamil, 400 mcg NTG, 2 ml 2% Lidocaine in 10 ml NS  A 5 Fr TIG 4.0 Catheter was advanced of over a Versicore wire into the ascending Aorta.  The catheter was used to engage the Left and Right coronary arteries.  Multiple cineangiographic views of both coronary artery system(s) were performed.  However, in order to better visualize the Left Coronary Artery, the catheter was exchanged for a 5Fr EBU 3.5 Guide This catheter was then exchanged over the Long Exchange Safety J wire for an angled Pigtail catheter that was advanced across the Aortic Valve.  LV hemodynamics were measured, and Left Ventriculography was performed.  LV hemodynamics were then re-sampled, and the catheter was pulled back across the Aortic Valve for measurement of "pull-back" gradient.  The catheter and wire were removed completely out of the body.  The sheath was  removed in the Cath Lab with a TR band placed at 15 ml Air at 1200 (time).  Reverse Allen's test revealed non-occlusive hemostasis.  The  patient was transported to the PACU Holding area in a chest pain free, hemodynamically stable condition.   The patient  was stable before, during and following the procedure.   Patient did tolerate procedure well. There were not complications.   EBL: < 10 ml  Medications:  Premedication: 19m  Valium  Sedation:  2 mg IV Versed, 50 mcg IV Fentanyl  Contrast:  100 ml Omnipaque  Radial Cocktail: 5 mg Verapamil, 400 mcg NTG, 2 ml 2% Lidocaine in 10 ml NS  IV Heparin: 7000 Units  Hemodynamics:  Central Aortic Pressure / Mean Aortic Pressure: 111/57 mmHg; 77 mmHg  LV Pressure / LV End diastolic Pressure:  1563/14mmHg; 23 mmHg  Left Ventriculography:  EF:  55-60  Wall Motion: normal   Coronary Angiographic Data:  Left Main:  Large caliber, mildly calcified vessel that bifurcates into the LAD and Circumflex; angiographically normal. Left Anterior Descending (LAD):  Large caliber, wrap around vessel with a focal ~80% eccentric lesion that is bordered by a ~50-60% irregular segment just after D1.  There are several small caliber diagonal branches.  The distal LAD is sub-totally occluded, as previously noted.  It does, however, re-constitute to perfuse the apex.  Circumflex (LCx):  Large caliber vessel with a moderate caliber OM1, then a ~60% irregular lesion before the proximally occluded OM2.  Distally, there is a focal ~80-90% lesion, before it terminates as a inferolateral OM3.  Besides the obvious focal lesions, the vessel itself is diffusely diseased.  1st obtuse marginal:  Moderate caliber vessel with diffuse luminal irregularities.  2nd obtuse marginal:  Known proximal occlusion with L-L collaterals that back-fill the distal vessel up to the occlusion. 3rd obtuse marginal:  Moderate caliber vessel with diffuse luminal irregularities.    Right Coronary Artery: Moderate to large caliber, dominant vessel with an ostial ~70% lesion followed by diffuse mild disease until two tandem  ~90-95% lesions in the mid vessel around the RV marginal.  After a segment without significant disease, there is a 70-80% lesion proximal to the bifurcation into the rPDA and Right Posterior AV Groove branch (RPAV)  RPDA: small to moderate caliber, diffusely diseased branch with a mid ~60% lesion.  It reaches ~1/2 way to the apex.  Impression:  Severe multivessel disease involving an extensively diseased RCA with at multiple severe lesions along with relatively focal lesions in the LAD and mid-distal Circumflex.  Preserved EF with mildly elevated LVEDP  Plan:  He is a diabetic with multiple risk factors, and has clearly been having progressively worsening dyspnea on exertion and now also notes arm numbness.  I am now certain that these symptoms are consistent with progressive/unstable angina, and I do not feel comfortable not proceeding with a plan for complete revascularization.   With the extent of his CAD, I feel that his best option for revascularization is CABG.    I have consulted CVTS, and he will be seen this evening.  He would like to hear both options of PCI and CABG.  I will place him in Observation status, to allow him to make a decision.  I have added Imdur and Ranexa.  No room to up-titrate BB dose, as he has been bradycardic.  He will need increased diuretics, given the elevated LVEDP.  Continue DM therapy with Insulin.   The case and results was discussed  with the patient ( and family).  Time Spend Directly with Patient:  45 minutes  HARDING,DAVID W, M.D., M.S. [facility] [address] [place], [place]  [address]  [phone]  04/27/2012 1215 PM

## 2012-04-29 ENCOUNTER — Encounter (HOSPITAL_COMMUNITY)
Admission: RE | Disposition: A | Discharge status: Home / Self Care | Payer: Self-pay | Source: Ambulatory Visit | Attending: Cardiology

## 2012-04-29 ENCOUNTER — Other Ambulatory Visit: Payer: Self-pay

## 2012-04-29 DIAGNOSIS — G4733 Obstructive sleep apnea (adult) (pediatric): Secondary | ICD-10-CM | POA: Diagnosis present

## 2012-04-29 DIAGNOSIS — I272 Pulmonary hypertension, unspecified: Secondary | ICD-10-CM | POA: Diagnosis present

## 2012-04-29 HISTORY — PX: PERCUTANEOUS CORONARY STENT INTERVENTION (PCI-S): SHX5485

## 2012-04-29 HISTORY — PX: CORONARY ANGIOPLASTY WITH STENT PLACEMENT: SHX49

## 2012-04-29 LAB — BASIC METABOLIC PANEL
CO2: 22 mEq/L (ref 19–32)
Chloride: 101 mEq/L (ref 96–112)
GFR calc Af Amer: 73 mL/min — ABNORMAL LOW (ref >90)
Potassium: 4.2 mEq/L (ref 3.5–5.1)

## 2012-04-29 LAB — GLUCOSE, CAPILLARY
Glucose-Capillary: 236 mg/dL — ABNORMAL HIGH (ref 70–99)
Glucose-Capillary: 206 mg/dL — ABNORMAL HIGH (ref 70–99)

## 2012-04-29 LAB — CBC
Platelets: 120 10*3/uL — ABNORMAL LOW (ref 150–400)
RBC: 4.23 MIL/uL (ref 4.22–5.81)
RDW: 13.9 % (ref 11.5–15.5)
WBC: 6.2 10*3/uL (ref 4.0–10.5)

## 2012-04-29 LAB — POCT ACTIVATED CLOTTING TIME: Activated Clotting Time: 394 seconds

## 2012-04-29 SURGERY — PERCUTANEOUS CORONARY STENT INTERVENTION (PCI-S)
Anesthesia: LOCAL

## 2012-04-29 MED ORDER — INSULIN ASPART 100 UNIT/ML ~~LOC~~ SOLN
0.0000 [IU] | Freq: Every day | SUBCUTANEOUS | Status: DC
  Administered 2012-04-29: 3 [IU] via SUBCUTANEOUS

## 2012-04-29 MED ORDER — MIDAZOLAM HCL 2 MG/2ML IJ SOLN
INTRAMUSCULAR | Status: AC
  Filled 2012-04-29: qty 2

## 2012-04-29 MED ORDER — DIAZEPAM 2 MG PO TABS: 2.0000 mg | ORAL_TABLET | ORAL | Status: DC | PRN

## 2012-04-29 MED ORDER — NITROGLYCERIN 1 MG/10 ML FOR IR/CATH LAB
INTRA_ARTERIAL | Status: AC
  Filled 2012-04-29: qty 10

## 2012-04-29 MED ORDER — ASPIRIN 81 MG PO CHEW
243.0000 mg | CHEWABLE_TABLET | ORAL | Status: AC
  Administered 2012-04-29: 12:00:00 243 mg via ORAL
  Filled 2012-04-29: qty 3

## 2012-04-29 MED ORDER — SODIUM CHLORIDE 0.9 % IV SOLN
INTRAVENOUS | Status: DC
  Administered 2012-04-29: 11:00:00 via INTRAVENOUS

## 2012-04-29 MED ORDER — LIDOCAINE HCL (PF) 1 % IJ SOLN
INTRAMUSCULAR | Status: AC
  Filled 2012-04-29: qty 30

## 2012-04-29 MED ORDER — SODIUM CHLORIDE 0.9 % IV SOLN: 250.0000 mL | INTRAVENOUS | Status: DC | PRN

## 2012-04-29 MED ORDER — SODIUM CHLORIDE 0.9 % IV SOLN
1.0000 mL/kg/h | INTRAVENOUS | Status: AC
  Administered 2012-04-29: 23:00:00 1 mL/kg/h via INTRAVENOUS

## 2012-04-29 MED ORDER — HEPARIN (PORCINE) IN NACL 2-0.9 UNIT/ML-% IJ SOLN
INTRAMUSCULAR | Status: AC
  Filled 2012-04-29: qty 1000

## 2012-04-29 MED ORDER — SODIUM CHLORIDE 0.9 % IJ SOLN: 3.0000 mL | Freq: Two times a day (BID) | INTRAMUSCULAR | Status: DC

## 2012-04-29 MED ORDER — VERAPAMIL HCL 2.5 MG/ML IV SOLN
INTRAVENOUS | Status: AC
  Filled 2012-04-29: qty 2

## 2012-04-29 MED ORDER — BIVALIRUDIN 250 MG IV SOLR
INTRAVENOUS | Status: AC
  Filled 2012-04-29: qty 250

## 2012-04-29 MED ORDER — SODIUM CHLORIDE 0.9 % IJ SOLN: 3.0000 mL | INTRAMUSCULAR | Status: DC | PRN

## 2012-04-29 MED ORDER — INSULIN ASPART 100 UNIT/ML ~~LOC~~ SOLN
0.0000 [IU] | Freq: Three times a day (TID) | SUBCUTANEOUS | Status: DC
  Administered 2012-04-29 – 2012-04-30 (×2): 5 [IU] via SUBCUTANEOUS

## 2012-04-29 MED ORDER — DIAZEPAM 5 MG PO TABS
5.0000 mg | ORAL_TABLET | ORAL | Status: AC
  Administered 2012-04-29: 13:00:00 5 mg via ORAL
  Filled 2012-04-29: qty 1

## 2012-04-29 MED ORDER — INSULIN NPH (HUMAN) (ISOPHANE) 100 UNIT/ML ~~LOC~~ SUSP
35.0000 [IU] | Freq: Two times a day (BID) | SUBCUTANEOUS | Status: DC
  Administered 2012-04-29 – 2012-04-30 (×2): 35 [IU] via SUBCUTANEOUS
  Filled 2012-04-29: qty 10

## 2012-04-29 MED ORDER — INSULIN ASPART 100 UNIT/ML ~~LOC~~ SOLN
10.0000 [IU] | Freq: Three times a day (TID) | SUBCUTANEOUS | Status: DC
  Administered 2012-04-29 – 2012-04-30 (×2): 10 [IU] via SUBCUTANEOUS

## 2012-04-29 MED ORDER — INSULIN GLARGINE 100 UNIT/ML ~~LOC~~ SOLN
7.0000 [IU] | Freq: Once | SUBCUTANEOUS | Status: AC
  Administered 2012-04-29: 10:00:00 7 [IU] via SUBCUTANEOUS

## 2012-04-29 MED ORDER — FENTANYL CITRATE 0.05 MG/ML IJ SOLN
INTRAMUSCULAR | Status: AC
  Filled 2012-04-29: qty 2

## 2012-04-29 MED ORDER — SODIUM CHLORIDE 0.9 % IV SOLN
0.2500 mg/kg/h | INTRAVENOUS | Status: DC
  Filled 2012-04-29: qty 250

## 2012-04-29 MED ORDER — ASPIRIN 81 MG PO CHEW: 324.0000 mg | CHEWABLE_TABLET | ORAL | Status: DC

## 2012-04-29 MED ORDER — INSULIN NPH (HUMAN) (ISOPHANE) 100 UNIT/ML ~~LOC~~ SUSP: 25.0000 [IU] | Freq: Every day | SUBCUTANEOUS | Status: DC

## 2012-04-29 MED FILL — Dextrose Inj 5%: INTRAVENOUS | Qty: 50 | Status: AC

## 2012-04-29 NOTE — CV Procedure (Signed)
[facility] PERCUTANEOUS CORONARY INTERVENTION REPORT  NAME:  Harold [name]   MRN: [id number] DOB:  1955/04/12   ADMIT DATE: 04/27/2012 Procedure Date: 04/29/2012  INTERVENTIONAL CARDIOLOGIST: Leonie Man, M.D., MS PRIMARY CARE PROVIDER: [name], MD PRIMARY CARDIOLOGIST: Leonie Man, M.D., MS  PATIENT:  Harold [name] is a 57 y.o. male with history of known coronary artery disease (multisite PTCA of the circumflex in 2003), diabetes type 2 on insulin, obesity with OSA, hypertension hyperlipidemia who I referred for diagnostic cardiac catheterization this past Monday, 04/27/2012, after a return clinic visit on 04/23/2012 revealed progressively worsening dyspnea on exertion with left arm numbness and chest discomfort. Diagnostic cardiac catheterization revealed severe multivessel disease. After consultation with cardiac surgery the patient chose to proceed with percutaneous coronary intervention as opposed to CABG. Yesterday he underwent two-vessel PCI of the mid LAD (3.0 mm  x 23 mm Xience DES --postdilated to 3.25 mm) and proximal/mid circumflex (2 separate Xience DES 2.5 mm x 12 mm postdilated to 2.75 mm). He now returns for staged PCI of multiple lesions in the RCA.  PRE-OPERATIVE DIAGNOSIS:    Severe multivessel CAD with residual ostial, mid and distal RCA lesions.  Planned staged multivessel PCI, day 2  PROCEDURES PERFORMED:    Complex, difficult multisite PCI of the ostial, mid and distal RCA with a total of 3 Promus Premier DES stents as described below.  PROCEDURE:Consent:  Risks of procedure as well as the alternatives and risks of each were explained to the (patient/caregiver).  Consent for procedure obtained. Consent for signed by MD and patient with RN witness -- placed on chart.   PROCEDURE: The patient was brought to the [facility] in the fasting state and prepped and draped in the usual sterile fashion  for Right groin or radial access. A modified Allen's test with plethysmography was performed, revealing excellent Ulnar artery collateral flow.  Sterile technique was used including antiseptics, cap, gloves, gown, hand hygiene, mask and sheet.  Skin prep: Chlorhexidine.  Time Out: Verified patient identification, verified procedure, site/side was marked, verified correct patient position, special equipment/implants available, medications/allergies/relevent history reviewed, required imaging and test results available.  Performed  Access: Right Radial Artery; 6 Fr Sheath -- Seldinger technique using an Angiocath Micropuncture Kit.  Radial Cocktail -- 10 mL administered via sheath, IV Angiomax bolus followed by drip Diagnostic - Guiding Angiography of RCA: 6 Fr JR4 With guideholes  Hemodynamics:  Central Aortic / Mean Pressures: 124/65 mmHg; 90 mmHg  Coronary Anatomy:   Right Coronary Artery: Moderate to large caliber, dominant vessel with an ostial ~70% lesion (Lesion #3) followed by diffuse mild disease until three tandem ~90-95% lesions (Lesion #2) in the mid vessel around the RV marginal. After a segment without significant disease, there is a 70-80% lesion proximal to the bifurcation (Lesion #1) into the rPDA and Right Posterior AV Groove branch (RPAV)  RPDA: small to moderate caliber, diffusely diseased branch with a mid ~60% lesion. It reaches ~1/2 way to the apex.  Percutaneous Coronary Intervention:  This was a a complex procedure due to the multiple lesions, the use of AngioSculpt scoring balloon, and the presence of an Ostial lesion of a Main Coronary Artery, multiple guidewires, and difference sized stents. Guide: 6 Fr   JR 4 with side holes  Guidewires:   Pro-water -- used for initial predilation of all 3 segments, and Lesion #1 Stent,  then has a buddy wire for the  remainder the procedure. For the ostial lesion it was used as a marker of the ostium and not actually in the  coronary.  Luge -- used as a second wire after I was unable to advance the AngioSculpt predilation balloon or stent beyond the distal stenosis of Lesion #2.  Following this his uses were course of the remainder the case. Predilation Balloon: Mini Trek 2 mm x 8 mm; used for each lesion  Lesion #3: 3 inflations at 8 Atm -- 8 Sec, 30 Sec and 10 Sec -- insuring that the ostium was dilated into the Aorta.  Lesion #2: 8 Atm x 30 Sec x 3 inflations one for each stenosis in the mid lesion segment  Lesion #1: 8 Atm x 30 Sec  Lesion #1: Focal distal RCA  Stent #1: Promus Premier 2.25 mm x 8 mm; deployed at 12 Atm x 30 Sec  Postdilated with stent balloon: 16 Atm x 48 Sec, final diameter 2.45 mm  Lesion #2: Mid RCA, 3 sequential 80-95% stenoses with irregularity bordering both ends  AngioSculpt "scoring balloon" 2.5 mm x 15 mm  Initial inflation over pro-water wire and proximal stenosis of Lesion #2: 4 Atm x 30 Sec  The balloon would not advance beyond the beginning portion of the third stenosis.  The balloon was removed and then readvanced over the Luge wire was it was advanced into the distal RCA. The balloon was then easily passed to cover each stenosis of lesion.   From distal to proximal, 2 inflations: 6 Atm x 30 Sec  Stent #2: Promus Premier 2.5 mm x 28 mm; deployed at 12 Atm x 30 Sec  Postdilated with stent balloon: 16 Atm x 45 Sec, final diameter 2.72 mm  Post-dilation Balloon: [place] Quantum Apex 2.75 mm x 15 mm; inflated distal to proximal with 3 successive inflations:  18 Atm x 45 Sec -- Final Diameter: 2.85 millimeter   Lesion #3: Ostial RCA  Predilation of ostium with Lesion #2 post-dilation balloon: [place] Quantum Apex 2.75 mm x 15 mm  8 Atm x 30 Sec The pro-water wire was advanced out of the distal end of the guide catheter into the aorta to marked the Aorto Ostium.   Stent #3: Promus Premier 2.75 mm x 16 mm; deployed at 12 Atm x 30 Sec  Postdilated with stent balloon: 16 Atm  x 45 Sec, final diameter 2.96 mm  Post-dilation Balloon: [place] Quantum Apex 3.8 mm x 43m;   16 Atm x 45 Sec -- Final Diameter: 3.1 millimeter  Flaring in the ostium: 18 Atm x 45 Sec   Post deployment angiography revealed excellent lesion coverage with appropriate stent dilation.  There was no evidence of dissection or perforation.  TR Band:  1600 Hours, 16 mL air  MEDICATIONS:  Anesthesia:  Local Lidocaine 2 ml  Sedation:  4 mg IV Versed, 100 mcg IV fentanyl ;   Premedication: Valium 574mPO  Omnipaque Contrast: 265 ml  Anticoagulation:  Angiomax Bolus & drip  Anti-Platelet Agent:  Brilinta 90 mg bid   PATIENT DISPOSITION:    The patient was transferred to the PACU holding area in a hemodynamicaly stable, chest pain free condition.  The patient tolerated the procedure well, and there were no complications.  EBL:   < 5 ml  The patient was stable before, during, and after the procedure.  POST-OPERATIVE DIAGNOSIS:    Successful multisite PCI of the ostial, mid and distal RCA with 3 drug-eluting stents:  Ostial: Promus Premier DES 2.75  mm 16 mm; postdilated to 3.1 mm  Mid (3 sequential stenoses): Promus Premier DES 2.5 mm 28 mm ; postdilated to 2.85 mm  Distal: Promus Premier DES 2.25 mm 8 mm; postdilated to 2.45 mm  PLAN OF CARE:  Post Radial cath care - anticipate discharge home in AM  DAPT indefinitely with 6 DES stents.  Will continue with Ranexa and for 1 month post PCI    HARDING,DAVID W, M.D., M.S. [facility] [address] [place], [place]  [address]  [phone]  04/29/2012 4:18 PM

## 2012-04-29 NOTE — Progress Notes (Signed)
Subjective:  No chest pain  Objective:  Vital Signs in the last 24 hours: Temp:  [97.2 F (36.2 C)-97.7 F (36.5 C)] 97.7 F (36.5 C) (03/12 0818) Pulse Rate:  [47-56] 50 (03/12 0526) BP: (112-166)/(50-79) 131/64 mmHg (03/12 0526) SpO2:  [92 %-100 %] 94 % (03/12 0526) Weight:  [141.3 kg (311 lb 8.2 oz)] 141.3 kg (311 lb 8.2 oz) (03/12 0001)  Intake/Output from previous day:  Intake/Output Summary (Last 24 hours) at 04/29/12 1014 Last data filed at 04/29/12 0500  Gross per 24 hour  Intake 1915.92 ml  Output    850 ml  Net 1065.92 ml    Physical Exam: General appearance: alert, cooperative, no distress and moderately obese Lungs: clear to auscultation bilaterally Heart: regular rate and rhythm rt groin oozing   Rate: 52  Rhythm: sinus bradycardia  Lab Results:  Recent Labs  04/28/12 0848 04/29/12 0605  WBC 4.5 6.2  HGB 14.0 13.1  PLT 123* 120*    Recent Labs  04/28/12 0848 04/29/12 0605  NA 134* 137  K 4.1 4.2  CL 98 101  CO2 26 22  GLUCOSE 317* 257*  BUN 21 17  CREATININE 1.34 1.23   No results found for this basename: TROPONINI, CK, MB,  in the last 72 hours Hepatic Function Panel No results found for this basename: PROT, ALBUMIN, AST, ALT, ALKPHOS, BILITOT, BILIDIR, IBILI,  in the last 72 hours No results found for this basename: CHOL,  in the last 72 hours No results found for this basename: INR,  in the last 72 hours  Imaging: Imaging results have been reviewed  Cardiac Studies:  Assessment/Plan:   Principal Problem:   Dyspnea on exertion -- associated with left arm numbness; concern for angina equivalent Active Problems:   CAD (coronary artery disease), native coronary artery   Diabetes mellitus   Hypertension   Carotid arterial disease   Hyperlipidemia   Atherosclerosis of native arteries of the extremities with intermittent claudication  Plan- RCA PCI today    [employer] PA-C 04/29/2012, 10:14 AM

## 2012-04-29 NOTE — Progress Notes (Addendum)
Site area: right groin  Site Prior to Removal:  Level 0  Pressure Applied For 60 MINUTES    Minutes Beginning at 20:15  Manual:   yes  Patient Status During Pull:  WNL  Post Pull Groin Site:  Level 1  Post Pull Instructions Given:  yes  Post Pull Pulses Present:  yes  Dressing Applied:  yes  Comments: Oozing; No hematoma; No bruising

## 2012-04-29 NOTE — Progress Notes (Signed)
I have seen and evaluated the patient this AM along with Kerin Ransom, PA.  I agree with his findings, examination as well as impression recommendations.  Excellent result in LAD & LCx PCI yesterday.    BP & HR stable.  & renal function remains stable.  As per plan - will proceed with PCI of RCA (ostial, mid & distal) lesions today to complete revascularization.  The mid RCA lesions are clearly the culprits for his crescendo dyspnea on exertion -- Unstable Angina process.  Glycemic control is ok.  This procedure has been fully reviewed with the patient and written informed consent has been obtained.   Leonie Man, M.D., M.S. [facility] [address] [place], [place]  [address]  [phone] Pager # [phone] 04/29/2012 10:42 AM

## 2012-04-29 NOTE — Progress Notes (Signed)
Inpatient Diabetes Program Recommendations  AACE/ADA: New Consensus Statement on Inpatient Glycemic Control (2013)  Target Ranges:  Prepandial:   less than 140 mg/dL      Peak postprandial:   less than 180 mg/dL (1-2 hours)      Critically ill patients:  140 - 180 mg/dL   Reason for Visit:  Results for Harold [name], Harold [name] (MRN [id number]) as of 04/29/2012 13:11  Ref. Range 04/28/2012 11:37 04/28/2012 18:07 04/28/2012 21:56 04/29/2012 08:21 04/29/2012 13:01  Glucose-Capillary Latest Range: 70-99 mg/dL 252 (H) 189 (H) 212 (H) 255 (H) 236 (H)   Note CBG's greater than goal.  According to medication reconciliation patient was taking large doses of NPH and Novolog prior to admit.  He was taking NPH-75 units with breakfast, 50 units lunch, and 75 units in the evening.  He also was taking Novolog 45 units with meals.   Please check A1C to determine pre-hospitalization glycemic control. Please consider discontinuing Lantus and add NPH 35 units with breakfast, NPH 25 units with lunch and NPH 35 units with supper.  This is approximately 1/2 of home basal doses.  Also once eating add Novolog 10 units tid with meals.

## 2012-04-29 NOTE — Progress Notes (Signed)
TR BAND REMOVAL  LOCATION:    right radial  DEFLATED PER PROTOCOL:    yes  TIME BAND OFF / DRESSING APPLIED:    1957  SITE UPON ARRIVAL:    Level 0  SITE AFTER BAND REMOVAL:    Level 0  REVERSE ALLEN'S TEST:     positive  CIRCULATION SENSATION AND MOVEMENT:    Within Normal Limits   yes  COMMENTS:   tolerated procedure well

## 2012-04-29 NOTE — Progress Notes (Signed)
[id number] Cardiac Rehab Stated stent discharge education with pt. Discussed stent,Brilinita,risk factors,diet and Oupt. CRP. Pt seems to have a very good understanding of the things that he needs to do. His biggest issues seems to be with his diet. Pt seems committed to making changes. He agrees to [employer]. CRP in [place], will send referral. We will follow pt tomorrow. Rodney Langton RN

## 2012-04-30 ENCOUNTER — Encounter (HOSPITAL_COMMUNITY): Payer: Self-pay | Admitting: Cardiology

## 2012-04-30 DIAGNOSIS — N184 Chronic kidney disease, stage 4 (severe): Secondary | ICD-10-CM | POA: Diagnosis present

## 2012-04-30 DIAGNOSIS — N182 Chronic kidney disease, stage 2 (mild): Secondary | ICD-10-CM | POA: Diagnosis present

## 2012-04-30 LAB — BASIC METABOLIC PANEL
CO2: 27 mEq/L (ref 19–32)
Chloride: 103 mEq/L (ref 96–112)
Creatinine, Ser: 1.48 mg/dL — ABNORMAL HIGH (ref 0.50–1.35)
GFR calc Af Amer: 58 mL/min — ABNORMAL LOW (ref >90)
Potassium: 4 mEq/L (ref 3.5–5.1)
Sodium: 138 mEq/L (ref 135–145)

## 2012-04-30 LAB — CBC
Platelets: 127 10*3/uL — ABNORMAL LOW (ref 150–400)
RBC: 4.14 MIL/uL — ABNORMAL LOW (ref 4.22–5.81)
RDW: 14.1 % (ref 11.5–15.5)
WBC: 5 10*3/uL (ref 4.0–10.5)

## 2012-04-30 MED ORDER — NITROGLYCERIN 0.4 MG SL SUBL: 0.4000 mg | SUBLINGUAL_TABLET | SUBLINGUAL | Status: DC | PRN

## 2012-04-30 MED ORDER — ASPIRIN 81 MG PO TBEC: 81.0000 mg | DELAYED_RELEASE_TABLET | Freq: Every day | ORAL | Status: DC

## 2012-04-30 MED ORDER — LISINOPRIL 20 MG PO TABS: 20.0000 mg | ORAL_TABLET | Freq: Every day | ORAL | Status: DC

## 2012-04-30 MED ORDER — RANOLAZINE ER 500 MG PO TB12: 500.0000 mg | ORAL_TABLET | Freq: Two times a day (BID) | ORAL | Status: DC

## 2012-04-30 MED ORDER — METFORMIN HCL 1000 MG PO TABS: 1000.0000 mg | ORAL_TABLET | Freq: Two times a day (BID) | ORAL | Status: DC

## 2012-04-30 MED ORDER — FUROSEMIDE 40 MG PO TABS: 40.0000 mg | ORAL_TABLET | Freq: Every day | ORAL | Status: DC

## 2012-04-30 MED ORDER — TICAGRELOR 90 MG PO TABS: 90.0000 mg | ORAL_TABLET | Freq: Two times a day (BID) | ORAL | Status: DC

## 2012-04-30 MED ORDER — ACETAMINOPHEN 325 MG PO TABS: 650.0000 mg | ORAL_TABLET | ORAL | Status: DC | PRN

## 2012-04-30 MED ORDER — LIVING WELL WITH DIABETES BOOK
Freq: Once | Status: AC
  Administered 2012-04-30: 05:00:00
  Filled 2012-04-30: qty 1

## 2012-04-30 MED ORDER — ISOSORBIDE MONONITRATE ER 60 MG PO TB24: 60.0000 mg | ORAL_TABLET | Freq: Every day | ORAL | Status: DC

## 2012-04-30 MED FILL — Dextrose Inj 5%: INTRAVENOUS | Qty: 50 | Status: AC

## 2012-04-30 NOTE — Progress Notes (Signed)
Subjective:  No chest pain  Objective:  Vital Signs in the last 24 hours: Temp:  [97.1 F (36.2 C)-98.1 F (36.7 C)] 97.6 F (36.4 C) (03/13 0815) Pulse Rate:  [48-56] 48 (03/13 0652) Resp:  [17] 17 (03/12 1949) BP: (106-122)/(38-77) 106/53 mmHg (03/13 0652) SpO2:  [92 %-100 %] 92 % (03/13 0652) Weight:  [142 kg (313 lb 0.9 oz)] 142 kg (313 lb 0.9 oz) (03/13 0038)  Intake/Output from previous day:  Intake/Output Summary (Last 24 hours) at 04/30/12 0940 Last data filed at 04/30/12 0900  Gross per 24 hour  Intake 2390.1 ml  Output   3350 ml  Net -959.9 ml    Physical Exam: General appearance: alert, cooperative, no distress and morbidly obese Lungs: clear to auscultation bilaterally Heart: regular rate and rhythm, S1, S2 normal, no murmur, click, rub or gallop Rt wrist without hematoma   Rate: 50  Rhythm: normal sinus rhythm and sinus bradycardia  Lab Results:  Recent Labs  04/29/12 0605 04/30/12 0645  WBC 6.2 5.0  HGB 13.1 12.8*  PLT 120* 127*    Recent Labs  04/29/12 0605 04/30/12 0645  NA 137 138  K 4.2 4.0  CL 101 103  CO2 22 27  GLUCOSE 257* 229*  BUN 17 15  CREATININE 1.23 1.48*   No results found for this basename: TROPONINI, CK, MB,  in the last 72 hours Hepatic Function Panel No results found for this basename: PROT, ALBUMIN, AST, ALT, ALKPHOS, BILITOT, BILIDIR, IBILI,  in the last 72 hours No results found for this basename: CHOL,  in the last 72 hours No results found for this basename: INR,  in the last 72 hours  Imaging: Imaging results have been reviewed  Cardiac Studies:  Assessment/Plan:   Principal Problem:   Dyspnea on exertion -- associated with left arm numbness; concern for angina equivalent Active Problems:   CAD, LAD/CFX DES 04/28/12- staged RCA DES 04/29/12 after pt declined CABG   Diabetes mellitus, type 2 IDDM with neuropathy   Pulmonary hypertension, 72mhg   Morbid obesity   Chronic renal insufficiency, stage II  (mild)   Hypertension   Carotid arterial disease   Hyperlipidemia   Sleep apnea on C-pap   Plan- Hold Lasix and ACE for 48hrs. Should be OK to discharge. Check BMP Monday. [facility]10 [facility]   LKerin RansomPA-C 04/30/2012, 9:40 AM   Agree with note written by [facility]Kerin [facility]/P staged MV PCI/Stent. On Abient. No nCCP/SOB. Scr slightly increased. OK for D/C home. MLP in 7-10 days [facility]   BLorretta Harp3/13/2014 11:39 AM

## 2012-04-30 NOTE — Progress Notes (Signed)
BRILINTA PT COPAY WILL BE $35.00.  Pt pharmacy ([facility]) will not have medication in until Friday.  Pt instructed [facility] to fill 30 day prescription.

## 2012-04-30 NOTE — Progress Notes (Addendum)
Discussed meds, diet, and exercise in length with patient and significant other.  Numerous questions answered, discussed areas of improvement in diet.  Pt eager to start cardiac rehab, voices concern about exercising due to history of back surgeries.  Will give "Living Well with Diabetes" book.  Radial and groin care handouts given and reviewed with both learners.

## 2012-04-30 NOTE — Progress Notes (Signed)
CARDIAC REHAB PHASE I   PRE:  Rate/Rhythm: 50 SB    BP: sitting 116/15    SaO2:   MODE:  Ambulation: 500 ft   POST:  Rate/Rhythm: 60 SR    BP: sitting 131/58     SaO2:   C/o hips aching but overall did well. Feels much improved with long distance. Ed completed. Many diet questions, also ex. Pt and partner motivated to change. Planning to do CRPII. [id number]   Josephina Shih [place] CES, ACSM 04/30/2012 10:08 AM

## 2012-04-30 NOTE — Discharge Summary (Signed)
Patient ID: Harold [name],  MRN: [id number], DOB/AGE: 1955/10/25 57 y.o.  Admit date: 04/27/2012 Discharge date: 04/30/2012  Primary Care Syvanna Ciolino:  Dr Arlyss Queen Primary Cardiologist:  Dr Ellyn Hack  Discharge Diagnoses  Principal Problem:   Dyspnea on exertion -- associated with left arm numbness; concern for angina equivalent  Active Problems:   CAD, LAD/CFX DES 04/28/12- staged RCA DES 04/29/12 after pt declined CABG   Prior CFX PCI 2003   EF 60-65% by echo 04/28/12   Diabetes mellitus, type 2 IDDM with neuropathy   Pulmonary hypertension, PA pressure 76mhg   Morbid obesity   Chronic renal insufficiency, stage II (mild)-SCr 1.48 at discharge   Hypertension   Carotid arterial disease   Hyperlipidemia   Sleep apnea on C-pap    Procedures:  Coronary angiogram 04/26/12                          LAD/CFX DES 04/28/12                          Staged RCA DES 04/29/12   Hospital Course:  57y/o with a history of CAD, s/p CFX PCI in 2003. He has had increasing DOE and Lt arm numbness that was worrisome for increasing angina. His symptoms are similar to his his pre PCI symptoms from 2003. Due to his concern and his body habitus Dr HEllyn Hackelected to proceed with diagnostic cath. The pt was admitted as an OP 04/26/12.  Cath revealed Severe multivessel disease involving an extensively diseased RCA with at multiple severe lesions along with relatively focal lesions in the LAD and mid-distal Circumflex. The pt was admitted and seen ion consult by Dr PMamie Nick VDarcey Nora The pt elected to proceed with PCI as opposed to CABG. On 04/28/12 he underwent elective LAD/CFX DES placement by Dr KClaiborne Billings He was hydrated overnight and set up for staged RCA PCI which was done 04/29/12 by Dr HEllyn Hackusing a total of 3 stents in the RCA. He received a total of 6 DES overall. Dr HEllyn Hackwould like the pt to remain on Ranexa X 1 month and DAPT indefinitely. On the 13th he was up ambulating and will be discharged. We are holding his  ACE, Metformin, and Lasix till 3/15. He will need a BMP Monday and an OV with a MLP in 7-10 days.   Discharge Vitals:  Blood pressure 106/53, pulse 48, temperature 97.6 F (36.4 C), temperature source Oral, resp. rate 17, height _0  (1.854 m), weight 142 kg (313 lb 0.9 oz), SpO2 92.00%.    Labs: Results for orders placed during the hospital encounter of 04/27/12 (from the past 48 hour(s))  GLUCOSE, CAPILLARY     Status: Abnormal   Collection Time    04/28/12 11:37 AM      Result Value Range   Glucose-Capillary 252 (*) 70 - 99 mg/dL  POCT ACTIVATED CLOTTING TIME     Status: None   Collection Time    04/28/12  4:01 PM      Result Value Range   Activated Clotting Time 595    GLUCOSE, CAPILLARY     Status: Abnormal   Collection Time    04/28/12  6:07 PM      Result Value Range   Glucose-Capillary 189 (*) 70 - 99 mg/dL  GLUCOSE, CAPILLARY     Status: Abnormal   Collection Time    04/28/12  9:56 PM  Result Value Range   Glucose-Capillary 212 (*) 70 - 99 mg/dL   Comment 1 Notify RN     Comment 2 Documented in Chart    CBC     Status: Abnormal   Collection Time    04/29/12  6:05 AM      Result Value Range   WBC 6.2  4.0 - 10.5 K/uL   RBC 4.23  4.22 - 5.81 MIL/uL   Hemoglobin 13.1  13.0 - 17.0 g/dL   HCT 36.5 (*) 39.0 - 52.0 %   MCV 86.3  78.0 - 100.0 fL   MCH 31.0  26.0 - 34.0 pg   MCHC 35.9  30.0 - 36.0 g/dL   RDW 13.9  11.5 - 15.5 %   Platelets 120 (*) 150 - 400 K/uL  BASIC METABOLIC PANEL     Status: Abnormal   Collection Time    04/29/12  6:05 AM      Result Value Range   Sodium 137  135 - 145 mEq/L   Potassium 4.2  3.5 - 5.1 mEq/L   Chloride 101  96 - 112 mEq/L   CO2 22  19 - 32 mEq/L   Glucose, Bld 257 (*) 70 - 99 mg/dL   BUN 17  6 - 23 mg/dL   Creatinine, Ser 1.23  0.50 - 1.35 mg/dL   Calcium 9.3  8.4 - 10.5 mg/dL   GFR calc non Af Amer 63 (*) >90 mL/min   GFR calc Af Amer 73 (*) >90 mL/min   Comment:            The eGFR has been calculated     using  the CKD EPI equation.     This calculation has not been     validated in all clinical     situations.     eGFR's persistently     <90 mL/min signify     possible Chronic Kidney Disease.  GLUCOSE, CAPILLARY     Status: Abnormal   Collection Time    04/29/12  8:21 AM      Result Value Range   Glucose-Capillary 255 (*) 70 - 99 mg/dL  GLUCOSE, CAPILLARY     Status: Abnormal   Collection Time    04/29/12  1:01 PM      Result Value Range   Glucose-Capillary 236 (*) 70 - 99 mg/dL  POCT ACTIVATED CLOTTING TIME     Status: None   Collection Time    04/29/12  2:28 PM      Result Value Range   Activated Clotting Time 394    GLUCOSE, CAPILLARY     Status: Abnormal   Collection Time    04/29/12  4:49 PM      Result Value Range   Glucose-Capillary 206 (*) 70 - 99 mg/dL  GLUCOSE, CAPILLARY     Status: Abnormal   Collection Time    04/29/12  9:59 PM      Result Value Range   Glucose-Capillary 282 (*) 70 - 99 mg/dL   Comment 1 Notify RN     Comment 2 Documented in Chart    CBC     Status: Abnormal   Collection Time    04/30/12  6:45 AM      Result Value Range   WBC 5.0  4.0 - 10.5 K/uL   RBC 4.14 (*) 4.22 - 5.81 MIL/uL   Hemoglobin 12.8 (*) 13.0 - 17.0 g/dL   HCT 36.0 (*) 39.0 - 52.0 %  MCV 87.0  78.0 - 100.0 fL   MCH 30.9  26.0 - 34.0 pg   MCHC 35.6  30.0 - 36.0 g/dL   RDW 14.1  11.5 - 15.5 %   Platelets 127 (*) 150 - 400 K/uL  BASIC METABOLIC PANEL     Status: Abnormal   Collection Time    04/30/12  6:45 AM      Result Value Range   Sodium 138  135 - 145 mEq/L   Potassium 4.0  3.5 - 5.1 mEq/L   Chloride 103  96 - 112 mEq/L   CO2 27  19 - 32 mEq/L   Glucose, Bld 229 (*) 70 - 99 mg/dL   BUN 15  6 - 23 mg/dL   Creatinine, Ser 1.48 (*) 0.50 - 1.35 mg/dL   Calcium 9.5  8.4 - 10.5 mg/dL   GFR calc non Af Amer 50 (*) >90 mL/min   GFR calc Af Amer 58 (*) >90 mL/min   Comment:            The eGFR has been calculated     using the CKD EPI equation.     This calculation has not  been     validated in all clinical     situations.     eGFR's persistently     <90 mL/min signify     possible Chronic Kidney Disease.  GLUCOSE, CAPILLARY     Status: Abnormal   Collection Time    04/30/12  8:26 AM      Result Value Range   Glucose-Capillary 233 (*) 70 - 99 mg/dL    Disposition:  Follow-up Information   Follow up with KILROY,LUKE K, PA-C. (office will call you)    Contact information:   [address] [place] [address] [phone]       Discharge Medications:    Medication List    STOP taking these medications       aspirin 325 MG tablet      TAKE these medications       acetaminophen 325 MG tablet  Commonly known as:  TYLENOL  Take 2 tablets (650 mg total) by mouth every 4 (four) hours as needed.     ALPRAZolam 0.5 MG tablet  Commonly known as:  XANAX  Take 0.5 mg by mouth 3 (three) times daily as needed. For anxiety     aspirin 81 MG EC tablet  Take 1 tablet (81 mg total) by mouth daily.     atenolol 25 MG tablet  Commonly known as:  TENORMIN  Take 25 mg by mouth every evening.     cholecalciferol 1000 UNITS tablet  Commonly known as:  VITAMIN D  Take 1,000 Units by mouth 4 (four) times daily.     cyclobenzaprine 10 MG tablet  Commonly known as:  FLEXERIL  Take 1 tablet (10 mg total) by mouth 3 (three) times daily as needed for muscle spasms.     escitalopram 20 MG tablet  Commonly known as:  LEXAPRO  Take 1 tablet (20 mg total) by mouth daily.     esomeprazole 40 MG capsule  Commonly known as:  NEXIUM  Take 40 mg by mouth daily.     felodipine 5 MG 24 hr tablet  Commonly known as:  PLENDIL  Take 5 mg by mouth daily.     fenofibrate micronized 134 MG capsule  Commonly known as:  LOFIBRA  Take 134 mg by mouth daily before breakfast.  fish oil-omega-3 fatty acids 1000 MG capsule  Take 1,200 mg by mouth 4 (four) times daily.     furosemide 40 MG tablet  Commonly known as:  LASIX  Take 1 tablet (40 mg  total) by mouth daily.  Start taking on:  05/02/2012     gabapentin 600 MG tablet  Commonly known as:  NEURONTIN  Take 600-1,200 mg by mouth 3 (three) times daily. 1 caps in the am, 1 cap midday,2 caps at night     Garlic 592 MG Caps  Take 500 mg by mouth daily.     insulin aspart 100 UNIT/ML injection  Commonly known as:  novoLOG  Patient is to take 45 units prior to each meal which is 3 times a day.     insulin NPH 100 UNIT/ML injection  Commonly known as:  HUMULIN N,NOVOLIN N  Inject 45-75 Units into the skin 3 (three) times daily before meals. 75 in the am, 50 at lunch, 75 in evening     isosorbide mononitrate 60 MG 24 hr tablet  Commonly known as:  IMDUR  Take 1 tablet (60 mg total) by mouth daily.     levothyroxine 75 MCG tablet  Commonly known as:  SYNTHROID, LEVOTHROID  Take 75 mcg by mouth daily.     lisinopril 20 MG tablet  Commonly known as:  PRINIVIL,ZESTRIL  Take 1 tablet (20 mg total) by mouth daily.  Start taking on:  05/02/2012     LUTEIN 20 PO  Take 20 mg by mouth daily.     metFORMIN 1000 MG tablet  Commonly known as:  GLUCOPHAGE  Take 1 tablet (1,000 mg total) by mouth 2 (two) times daily with a meal.  Start taking on:  05/02/2012     multivitamin with minerals tablet  Take 1 tablet by mouth daily.     nitroGLYCERIN 0.4 MG SL tablet  Commonly known as:  NITROSTAT  Place 1 tablet (0.4 mg total) under the tongue every 5 (five) minutes as needed for chest pain (severe chest pressure or tightness).     Potassium 99 MG Tabs  Take 99 mg by mouth daily.     pregabalin 75 MG capsule  Commonly known as:  LYRICA  Take 75-150 mg by mouth 2 (two) times daily. Pt takes 1 pill QAM and 2 pills QHS     ranolazine 500 MG 12 hr tablet  Commonly known as:  RANEXA  Take 1 tablet (500 mg total) by mouth 2 (two) times daily.     simvastatin 40 MG tablet  Commonly known as:  ZOCOR  Take 40 mg by mouth every evening.     Ticagrelor 90 MG Tabs tablet  Commonly  known as:  BRILINTA  Take 1 tablet (90 mg total) by mouth 2 (two) times daily.     zolpidem 10 MG tablet  Commonly known as:  AMBIEN  Take 10 mg by mouth at bedtime as needed for sleep.        Duration of Discharge Encounter: Greater than 30 minutes including physician time.  Signed, Kerin Ransom PA-C 04/30/2012 9:55 AM  Slight increase in creatinine. I agree with outpatient BMP. Excellent result with percutaneous revascularization.   Leonie Man, M.D., M.S. [facility] [address] [place], [place]  [address]  [phone] Pager # [phone] 04/30/2012 3:01 PM

## 2012-05-02 ENCOUNTER — Other Ambulatory Visit: Payer: Self-pay | Admitting: Emergency Medicine

## 2012-05-07 ENCOUNTER — Other Ambulatory Visit: Payer: Self-pay | Admitting: Emergency Medicine

## 2012-05-07 NOTE — Telephone Encounter (Signed)
Please advise on Ambien refill.

## 2012-05-08 ENCOUNTER — Other Ambulatory Visit: Payer: Self-pay | Admitting: Radiology

## 2012-05-08 NOTE — Addendum Note (Signed)
Addendum created 05/08/12 1928 by Sydnee Levans, MD   Modules edited: Anesthesia Responsible Staff

## 2012-05-08 NOTE — Telephone Encounter (Signed)
Faxed rx to [facility].

## 2012-05-21 ENCOUNTER — Encounter (HOSPITAL_COMMUNITY)
Admission: RE | Admit: 2012-05-21 | Discharge: 2012-05-21 | Disposition: A | Discharge status: Home / Self Care | Payer: PRIVATE HEALTH INSURANCE | Source: Ambulatory Visit | Attending: Cardiology | Admitting: Cardiology

## 2012-05-21 DIAGNOSIS — Z5189 Encounter for other specified aftercare: Secondary | ICD-10-CM | POA: Insufficient documentation

## 2012-05-21 DIAGNOSIS — Z9861 Coronary angioplasty status: Secondary | ICD-10-CM | POA: Insufficient documentation

## 2012-05-24 ENCOUNTER — Other Ambulatory Visit: Payer: Self-pay | Admitting: Emergency Medicine

## 2012-05-25 ENCOUNTER — Encounter (HOSPITAL_COMMUNITY)
Admission: RE | Admit: 2012-05-25 | Discharge: 2012-05-25 | Disposition: A | Discharge status: Home / Self Care | Payer: PRIVATE HEALTH INSURANCE | Source: Ambulatory Visit | Attending: Cardiology | Admitting: Cardiology

## 2012-05-25 ENCOUNTER — Encounter (HOSPITAL_COMMUNITY): Payer: PRIVATE HEALTH INSURANCE

## 2012-05-25 LAB — GLUCOSE, CAPILLARY: Glucose-Capillary: 230 mg/dL — ABNORMAL HIGH (ref 70–99)

## 2012-05-25 NOTE — Progress Notes (Signed)
Pt started cardiac rehab today.  Pt tolerated light exercise without difficulty. Telemetry  Sinus rhythm without ectopy.  Vital signs stable. Will continue to monitor the patient throughout  the program. Harold [name] did complaint of foot pain and burning on the walking track.  Encouraged the patient to take rest breaks as needed.Will continue to monitor the patient throughout  the program.

## 2012-05-27 ENCOUNTER — Encounter (HOSPITAL_COMMUNITY)
Admission: RE | Admit: 2012-05-27 | Discharge: 2012-05-27 | Disposition: A | Discharge status: Home / Self Care | Payer: PRIVATE HEALTH INSURANCE | Source: Ambulatory Visit | Attending: Cardiology | Admitting: Cardiology

## 2012-05-27 ENCOUNTER — Encounter (HOSPITAL_COMMUNITY): Payer: PRIVATE HEALTH INSURANCE

## 2012-05-27 LAB — GLUCOSE, CAPILLARY
Glucose-Capillary: 143 mg/dL — ABNORMAL HIGH (ref 70–99)
Glucose-Capillary: 87 mg/dL (ref 70–99)

## 2012-05-29 ENCOUNTER — Encounter (HOSPITAL_COMMUNITY)
Admission: RE | Admit: 2012-05-29 | Discharge: 2012-05-29 | Disposition: A | Discharge status: Home / Self Care | Payer: PRIVATE HEALTH INSURANCE | Source: Ambulatory Visit | Attending: Cardiology | Admitting: Cardiology

## 2012-05-29 ENCOUNTER — Encounter (HOSPITAL_COMMUNITY): Payer: PRIVATE HEALTH INSURANCE

## 2012-05-29 LAB — GLUCOSE, CAPILLARY
Glucose-Capillary: 289 mg/dL — ABNORMAL HIGH (ref 70–99)
Glucose-Capillary: 134 mg/dL — ABNORMAL HIGH (ref 70–99)

## 2012-06-01 ENCOUNTER — Encounter (HOSPITAL_COMMUNITY): Payer: PRIVATE HEALTH INSURANCE

## 2012-06-01 ENCOUNTER — Encounter (HOSPITAL_COMMUNITY)
Admission: RE | Admit: 2012-06-01 | Discharge: 2012-06-01 | Disposition: A | Discharge status: Home / Self Care | Payer: PRIVATE HEALTH INSURANCE | Source: Ambulatory Visit | Attending: Cardiology | Admitting: Cardiology

## 2012-06-02 ENCOUNTER — Ambulatory Visit (INDEPENDENT_AMBULATORY_CARE_PROVIDER_SITE_OTHER): Payer: PRIVATE HEALTH INSURANCE | Admitting: Emergency Medicine

## 2012-06-02 ENCOUNTER — Encounter: Payer: Self-pay | Admitting: Emergency Medicine

## 2012-06-02 VITALS — BP 98/56 | HR 56 | Temp 96.6°F | Resp 16 | Ht 73.5 in | Wt 306.0 lb

## 2012-06-02 DIAGNOSIS — I2510 Atherosclerotic heart disease of native coronary artery without angina pectoris: Secondary | ICD-10-CM

## 2012-06-02 DIAGNOSIS — E119 Type 2 diabetes mellitus without complications: Secondary | ICD-10-CM

## 2012-06-02 DIAGNOSIS — Z0000 Encounter for general adult medical examination without abnormal findings: Secondary | ICD-10-CM

## 2012-06-02 LAB — IFOBT (OCCULT BLOOD): IFOBT: NEGATIVE

## 2012-06-02 LAB — POCT URINALYSIS DIPSTICK
Bilirubin, UA: NEGATIVE
Blood, UA: NEGATIVE
Glucose, UA: 500
Ketones, UA: NEGATIVE
Leukocytes, UA: NEGATIVE
Nitrite, UA: NEGATIVE
pH, UA: 5.5

## 2012-06-02 NOTE — Progress Notes (Signed)
Subjective:    Patient ID: Harold [name], male    DOB: [birth year], 57 y.o.   MRN: [id number]  HPI 57 yo male with extensive PMH. Reports he had his 4th back surgery in November which went well. Had 6 stents placed in 04/27/12. Was hospitalized for 4 days for that. Lost 20lbs of fluid in that visit. Had venous and arterial dopplers done looking for PVD. Is currently in cardio rehab, which is going well.  Reports sugars were high, but he has changed his diet and exercising, and has been able to decrease his insulin. Saw endocrinologist yesterday. Has seen nutritionist, cut back on eggs and breakfast foods. Having salads for large meals and having snack bars. Trying to walk every day but it is painful due to neuropathy. Had Lyrica increased to 152m TID. Does daily foot exams for last 2 years. Also taking gabapentin, which he would like to discontinue. BP has been good since surgery, cardio rehab checks it 4x daily.  Has a new complaint of pain in his hands with loss of grip strength. Hurts in palm to the point he cannot close them all the way and "they lock like a trigger finger". Mentioned it to Dr. BChalmers Cateryesterday who told him to discuss it today.  Still using C-pap for sleep.  Depression in under control.   Has been back to work for 3 weeks, doing well. Last colonoscopy 3 years ago.     Review of Systems  Constitutional: Positive for unexpected weight change (last month in hospital, gained and lost 20lbs of fluid). Negative for fever and chills.  Respiratory: Negative for shortness of breath (resolved after stents).   Cardiovascular: Positive for chest pain (mild and occasional) and leg swelling (improved). Negative for palpitations.  Gastrointestinal: Negative for nausea, vomiting, diarrhea and constipation.  Musculoskeletal: Positive for back pain (surgery in 11/13, improved but still painful).  Skin: Negative for rash.       Objective:   Physical Exam  Constitutional: He is oriented to  person, place, and time. He appears well-developed and well-nourished.  HENT:  Head: Normocephalic and atraumatic.  Right Ear: External ear normal.  Left Ear: External ear normal.  Mouth/Throat: Oropharynx is clear and moist.  Eyes: Conjunctivae and EOM are normal. Pupils are equal, round, and reactive to light.  Neck: Normal range of motion. Neck supple.  Cardiovascular: Normal rate, regular rhythm and normal heart sounds.   Pulses:      Dorsalis pedis pulses are 1+ on the right side, and 1+ on the left side.       Posterior tibial pulses are 1+ on the right side, and 1+ on the left side.  2+ pitting edema bilaterally  Pulmonary/Chest: Effort normal and breath sounds normal.  Abdominal: Soft. Bowel sounds are normal.  Neurological: He is alert and oriented to person, place, and time.  Skin: Skin is warm and dry.  Hemorrhoid seen on rectal exam. Triggering of 4th fingers bilaterally. Evidence of borderline dupuytren's contracture.  Results for orders placed in visit on 06/02/12  IFOBT (OCCULT BLOOD)      Result Value Range   IFOBT Negative            Assessment & Plan:  On lyrica and gabapentin, will taper 3/day x1 week, 2/day x 1 week, 1/day x 1 week then stop gabapentin, as Dr. BChalmers Caterincreased Lyrica dose. Had all blood work done previously. Prostate and hemosure done today. U/A dipped and microalbumin sent. Referral to hand surgeon for  dupuytren's contractures. Continue weight loss, exercise, diet. Will follow up in 4 months.

## 2012-06-03 ENCOUNTER — Encounter (HOSPITAL_COMMUNITY)
Admission: RE | Admit: 2012-06-03 | Discharge: 2012-06-03 | Disposition: A | Discharge status: Home / Self Care | Payer: PRIVATE HEALTH INSURANCE | Source: Ambulatory Visit | Attending: Cardiology | Admitting: Cardiology

## 2012-06-03 ENCOUNTER — Encounter (HOSPITAL_COMMUNITY): Payer: PRIVATE HEALTH INSURANCE

## 2012-06-03 NOTE — Progress Notes (Signed)
Harold [name] came to exercise today he said he had loose stools in the bathroom and an upset stomach.  Advised Harold [name] not to exercise today.

## 2012-06-05 ENCOUNTER — Encounter (HOSPITAL_COMMUNITY)
Admission: RE | Admit: 2012-06-05 | Discharge: 2012-06-05 | Disposition: A | Discharge status: Home / Self Care | Payer: PRIVATE HEALTH INSURANCE | Source: Ambulatory Visit | Attending: Cardiology | Admitting: Cardiology

## 2012-06-05 ENCOUNTER — Encounter (HOSPITAL_COMMUNITY): Payer: PRIVATE HEALTH INSURANCE

## 2012-06-05 LAB — GLUCOSE, CAPILLARY: Glucose-Capillary: 288 mg/dL — ABNORMAL HIGH (ref 70–99)

## 2012-06-08 ENCOUNTER — Encounter (HOSPITAL_COMMUNITY): Payer: Self-pay | Admitting: *Deleted

## 2012-06-08 ENCOUNTER — Encounter (HOSPITAL_COMMUNITY)
Admission: RE | Admit: 2012-06-08 | Discharge: 2012-06-08 | Disposition: A | Discharge status: Home / Self Care | Payer: PRIVATE HEALTH INSURANCE | Source: Ambulatory Visit | Attending: Cardiology | Admitting: Cardiology

## 2012-06-08 ENCOUNTER — Encounter (HOSPITAL_COMMUNITY): Payer: PRIVATE HEALTH INSURANCE

## 2012-06-08 NOTE — Progress Notes (Signed)
Harold [name] 57 y.o. male  Nutrition Note Spoke with pt.  Nutrition Plan and Nutrition Survey goals reviewed with pt. Pt is following Step 2 of the Therapeutic Lifestyle Changes diet. Pt wants to lose wt. Pt has been trying to lose wt by "working with Amy, a Nutritionist, at my doctor's office."  Wt loss tips reviewed.  Pt is diabetic. Last A1c indicates blood glucose not well-controlled. Pt reports his insulin was changed to Humulin N 50 units BID and 25 units Novolog BID. This Probation officer went over Diabetes Education test results. Pt expressed understanding of the information reviewed. Pt aware of nutrition education classes offered and plans on attending nutrition classes. Nutrition Diagnosis   Food-and nutrition-related knowledge deficit related to lack of exposure to information as related to diagnosis of: ? CVD ? DM (A1c 8.0)   Obesity related to excessive energy intake as evidenced by a BMI of 40.9 Nutrition RX/ Estimated Daily Nutrition Needs for: wt loss  2250-2750 Kcal, 60-75 gm fat, 15-18 gm sat fat, 2.2-2.8 gm trans-fat, <1500 mg sodium, 250-325 gm CHO  Nutrition Intervention   Pt's individual nutrition plan reviewed with pt.   Benefits of adopting Therapeutic Lifestyle Changes discussed when Medficts reviewed.   Pt to attend the Portion Distortion class   Pt to attend the  ? Nutrition I class                     ? Nutrition II class        ? Diabetes Blitz class       ? Diabetes Q & A class   Continue client-centered nutrition education by RD, as part of interdisciplinary care. Goal(s)   Pt to identify food quantities necessary to achieve: ? wt loss to a goal wt of 292-310 lb (132.6-140.8 kg) at graduation from cardiac rehab.  Monitor and Evaluate progress toward nutrition goal with team. Nutrition Risk: Change to Moderate   Derek Mound, M.Ed, RD, LDN, CDE 06/08/2012 10:42 AM

## 2012-06-08 NOTE — Progress Notes (Signed)
Reviewed Coulter's quality of life questionnaire.Marland Kitchen

## 2012-06-08 NOTE — Progress Notes (Signed)
1012- Home exercise guidelines reviewed with patient by academic intern, Mertha Finders, including endpoints, temperature precautions, target heart rate and rate of perceived exertions. Pt plans to walk as his mode of home exercise. Pt voices understanding of instructions given.  Seward Carol, MS, ACSM CES

## 2012-06-10 ENCOUNTER — Encounter (HOSPITAL_COMMUNITY): Payer: PRIVATE HEALTH INSURANCE

## 2012-06-10 ENCOUNTER — Encounter (HOSPITAL_COMMUNITY)
Admission: RE | Admit: 2012-06-10 | Discharge: 2012-06-10 | Disposition: A | Discharge status: Home / Self Care | Payer: PRIVATE HEALTH INSURANCE | Source: Ambulatory Visit | Attending: Cardiology | Admitting: Cardiology

## 2012-06-10 NOTE — Progress Notes (Signed)
Pt pre exercise blood glucose was 388.  Pt surprised that it was "that high" and requested a second check.  Second blood glucose was 351. Questioned pt regarding home fasting reading this am.  Pt reported that his blood glucose was 289 and he ate a power bar for breakfast.  Pt negative for ketones. Instructed to drink water and to monitor his blood glucose closely.

## 2012-06-12 ENCOUNTER — Encounter (HOSPITAL_COMMUNITY): Payer: PRIVATE HEALTH INSURANCE

## 2012-06-15 ENCOUNTER — Encounter (HOSPITAL_COMMUNITY)
Admission: RE | Admit: 2012-06-15 | Discharge: 2012-06-15 | Disposition: A | Discharge status: Home / Self Care | Payer: PRIVATE HEALTH INSURANCE | Source: Ambulatory Visit | Attending: Cardiology | Admitting: Cardiology

## 2012-06-15 ENCOUNTER — Encounter (HOSPITAL_COMMUNITY): Payer: PRIVATE HEALTH INSURANCE

## 2012-06-15 ENCOUNTER — Other Ambulatory Visit: Payer: Self-pay | Admitting: Emergency Medicine

## 2012-06-15 LAB — GLUCOSE, CAPILLARY: Glucose-Capillary: 364 mg/dL — ABNORMAL HIGH (ref 70–99)

## 2012-06-15 NOTE — Progress Notes (Signed)
CBG 364.  Urine negative for ketones.  Harold [name] said he took his medications and insulin as prescribed.  Harold [name] office called and notified spoke with Harold [name] over the phone.  Harold [name] office spoke with Harold [name] over the phone. Harold [name] did not exercise today. Harold [name] office will contact the patient at home if any medication changes are to be made,

## 2012-06-16 ENCOUNTER — Other Ambulatory Visit: Payer: Self-pay | Admitting: Radiology

## 2012-06-17 ENCOUNTER — Encounter (HOSPITAL_COMMUNITY): Payer: PRIVATE HEALTH INSURANCE

## 2012-06-19 ENCOUNTER — Encounter (HOSPITAL_COMMUNITY): Payer: PRIVATE HEALTH INSURANCE

## 2012-06-22 ENCOUNTER — Encounter (HOSPITAL_COMMUNITY): Payer: PRIVATE HEALTH INSURANCE

## 2012-06-22 ENCOUNTER — Encounter (HOSPITAL_COMMUNITY)
Admission: RE | Admit: 2012-06-22 | Discharge: 2012-06-22 | Disposition: A | Discharge status: Home / Self Care | Payer: PRIVATE HEALTH INSURANCE | Source: Ambulatory Visit | Attending: Cardiology | Admitting: Cardiology

## 2012-06-22 DIAGNOSIS — Z9861 Coronary angioplasty status: Secondary | ICD-10-CM | POA: Insufficient documentation

## 2012-06-22 DIAGNOSIS — Z5189 Encounter for other specified aftercare: Secondary | ICD-10-CM | POA: Insufficient documentation

## 2012-06-22 LAB — GLUCOSE, CAPILLARY
Glucose-Capillary: 106 mg/dL — ABNORMAL HIGH (ref 70–99)
Glucose-Capillary: 118 mg/dL — ABNORMAL HIGH (ref 70–99)

## 2012-06-23 NOTE — Progress Notes (Signed)
Harold [name] told me yesterday that his partner was in the hospital over the weekend and expected to be discharged yesterday. I offered for Harold [name] to talk with out hospital chaplain. Harold [name] declined at this time. Will continue to monitor the patient throughout  the program.

## 2012-06-24 ENCOUNTER — Encounter (HOSPITAL_COMMUNITY): Payer: PRIVATE HEALTH INSURANCE

## 2012-06-24 ENCOUNTER — Encounter (HOSPITAL_COMMUNITY)
Admission: RE | Admit: 2012-06-24 | Discharge: 2012-06-24 | Disposition: A | Discharge status: Home / Self Care | Payer: PRIVATE HEALTH INSURANCE | Source: Ambulatory Visit | Attending: Cardiology | Admitting: Cardiology

## 2012-06-24 LAB — GLUCOSE, CAPILLARY: Glucose-Capillary: 312 mg/dL — ABNORMAL HIGH (ref 70–99)

## 2012-06-24 NOTE — Progress Notes (Signed)
Harold [name]'s blood sugar is 312. Urine is negative for ketones. Patient was counseled by the dietitian. Kacy said his blood sugar at home was 278. Dr Almetta Lovely office called and notified. Masai will not exercise per protocol.

## 2012-06-26 ENCOUNTER — Encounter (HOSPITAL_COMMUNITY): Payer: PRIVATE HEALTH INSURANCE

## 2012-06-29 ENCOUNTER — Encounter (HOSPITAL_COMMUNITY): Payer: PRIVATE HEALTH INSURANCE

## 2012-07-01 ENCOUNTER — Encounter (HOSPITAL_COMMUNITY): Payer: PRIVATE HEALTH INSURANCE

## 2012-07-02 ENCOUNTER — Other Ambulatory Visit: Payer: Self-pay | Admitting: Pharmacist

## 2012-07-02 MED ORDER — FENOFIBRATE MICRONIZED 134 MG PO CAPS: 134.0000 mg | ORAL_CAPSULE | Freq: Every day | ORAL | Status: DC

## 2012-07-02 MED ORDER — FELODIPINE ER 5 MG PO TB24: 5.0000 mg | ORAL_TABLET | Freq: Every day | ORAL | Status: DC

## 2012-07-03 ENCOUNTER — Encounter (HOSPITAL_COMMUNITY): Payer: PRIVATE HEALTH INSURANCE

## 2012-07-06 ENCOUNTER — Other Ambulatory Visit: Payer: Self-pay | Admitting: Physician Assistant

## 2012-07-06 ENCOUNTER — Encounter (HOSPITAL_COMMUNITY): Payer: PRIVATE HEALTH INSURANCE

## 2012-07-08 ENCOUNTER — Encounter (HOSPITAL_COMMUNITY): Payer: PRIVATE HEALTH INSURANCE

## 2012-07-10 ENCOUNTER — Encounter (HOSPITAL_COMMUNITY): Payer: PRIVATE HEALTH INSURANCE

## 2012-07-13 ENCOUNTER — Encounter (HOSPITAL_COMMUNITY): Payer: PRIVATE HEALTH INSURANCE

## 2012-07-15 ENCOUNTER — Encounter (HOSPITAL_COMMUNITY): Payer: PRIVATE HEALTH INSURANCE

## 2012-07-17 ENCOUNTER — Encounter (HOSPITAL_COMMUNITY): Payer: PRIVATE HEALTH INSURANCE

## 2012-07-20 ENCOUNTER — Encounter (HOSPITAL_COMMUNITY): Payer: PRIVATE HEALTH INSURANCE

## 2012-07-21 ENCOUNTER — Ambulatory Visit: Payer: PRIVATE HEALTH INSURANCE | Admitting: Cardiology

## 2012-07-22 ENCOUNTER — Encounter (HOSPITAL_COMMUNITY): Payer: PRIVATE HEALTH INSURANCE

## 2012-07-22 ENCOUNTER — Other Ambulatory Visit: Payer: Self-pay | Admitting: Emergency Medicine

## 2012-07-24 ENCOUNTER — Encounter (HOSPITAL_COMMUNITY): Payer: PRIVATE HEALTH INSURANCE

## 2012-07-27 ENCOUNTER — Encounter (HOSPITAL_COMMUNITY): Payer: PRIVATE HEALTH INSURANCE

## 2012-07-29 ENCOUNTER — Encounter (HOSPITAL_COMMUNITY): Payer: PRIVATE HEALTH INSURANCE

## 2012-07-31 ENCOUNTER — Encounter (HOSPITAL_COMMUNITY): Payer: PRIVATE HEALTH INSURANCE

## 2012-08-03 ENCOUNTER — Encounter (HOSPITAL_COMMUNITY): Payer: PRIVATE HEALTH INSURANCE

## 2012-08-05 ENCOUNTER — Encounter (HOSPITAL_COMMUNITY): Payer: PRIVATE HEALTH INSURANCE

## 2012-08-07 ENCOUNTER — Encounter (HOSPITAL_COMMUNITY): Payer: PRIVATE HEALTH INSURANCE

## 2012-08-10 ENCOUNTER — Encounter (HOSPITAL_COMMUNITY): Payer: PRIVATE HEALTH INSURANCE

## 2012-08-12 ENCOUNTER — Encounter (HOSPITAL_COMMUNITY): Payer: PRIVATE HEALTH INSURANCE

## 2012-08-14 ENCOUNTER — Encounter: Payer: Self-pay | Admitting: Cardiology

## 2012-08-14 ENCOUNTER — Encounter (HOSPITAL_COMMUNITY): Payer: PRIVATE HEALTH INSURANCE

## 2012-08-17 ENCOUNTER — Encounter (HOSPITAL_COMMUNITY): Payer: PRIVATE HEALTH INSURANCE

## 2012-08-18 ENCOUNTER — Ambulatory Visit: Payer: PRIVATE HEALTH INSURANCE | Admitting: Emergency Medicine

## 2012-08-18 ENCOUNTER — Encounter: Payer: Self-pay | Admitting: *Deleted

## 2012-08-19 ENCOUNTER — Encounter (HOSPITAL_COMMUNITY): Payer: PRIVATE HEALTH INSURANCE

## 2012-08-19 ENCOUNTER — Encounter: Payer: Self-pay | Admitting: Cardiology

## 2012-08-21 ENCOUNTER — Encounter (HOSPITAL_COMMUNITY): Payer: PRIVATE HEALTH INSURANCE

## 2012-08-22 ENCOUNTER — Encounter: Payer: Self-pay | Admitting: Cardiology

## 2012-08-24 ENCOUNTER — Encounter (HOSPITAL_COMMUNITY): Payer: PRIVATE HEALTH INSURANCE

## 2012-08-24 ENCOUNTER — Ambulatory Visit (INDEPENDENT_AMBULATORY_CARE_PROVIDER_SITE_OTHER): Payer: PRIVATE HEALTH INSURANCE | Admitting: Cardiology

## 2012-08-24 VITALS — BP 112/62 | HR 49 | Ht 73.0 in | Wt 298.5 lb

## 2012-08-24 DIAGNOSIS — G629 Polyneuropathy, unspecified: Secondary | ICD-10-CM

## 2012-08-24 DIAGNOSIS — E785 Hyperlipidemia, unspecified: Secondary | ICD-10-CM

## 2012-08-24 DIAGNOSIS — I2510 Atherosclerotic heart disease of native coronary artery without angina pectoris: Secondary | ICD-10-CM

## 2012-08-24 DIAGNOSIS — Z01810 Encounter for preprocedural cardiovascular examination: Secondary | ICD-10-CM

## 2012-08-24 DIAGNOSIS — G4730 Sleep apnea, unspecified: Secondary | ICD-10-CM

## 2012-08-24 DIAGNOSIS — I2789 Other specified pulmonary heart diseases: Secondary | ICD-10-CM

## 2012-08-24 DIAGNOSIS — I272 Pulmonary hypertension, unspecified: Secondary | ICD-10-CM

## 2012-08-24 DIAGNOSIS — I10 Essential (primary) hypertension: Secondary | ICD-10-CM

## 2012-08-24 DIAGNOSIS — R5383 Other fatigue: Secondary | ICD-10-CM

## 2012-08-24 DIAGNOSIS — G589 Mononeuropathy, unspecified: Secondary | ICD-10-CM

## 2012-08-24 DIAGNOSIS — E669 Obesity, unspecified: Secondary | ICD-10-CM

## 2012-08-24 DIAGNOSIS — R0609 Other forms of dyspnea: Secondary | ICD-10-CM

## 2012-08-24 NOTE — Patient Instructions (Addendum)
Your physician has recommended you make the following change in your medication decrease Atenolol to half tablet a day. After a month the reduce Simvastatin to 20 mg a day    Your physician wants you to follow-up in 4 months with Dr Ellyn Hack. You will receive a reminder letter in the mail two months in advance. If you don't receive a letter, please call our office to schedule the follow-up appointment.

## 2012-08-24 NOTE — Progress Notes (Addendum)
Patient ID: MONTERIUS [name], male   DOB: [birth year], 57 y.o.   MRN: [id number]  Clinic Note: HPI: Harold [name] is a 57 y.o. 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  He continues to follow up with Estelle Grumbles, our dietician about adjusting his diet.  He has definitely changed is diet and he is trying to get more active now with doing cardiac rehab. At his last visit, he had lost about 7 pounds, and today he is down another 8 pounds from his last visit, dropping his BMI below 40 the first time. He says that the diarrhea symptoms again will do better but still there.  Otherwise, at the last visit, he was feeling relatively well with no recurrence of chest pain or shortness of breath with exertion.  Interval History: since his last visit he's been doing fairly well. He said that yesterday he had more than usual episodes of discomfort in his chest at rest, but he been under lots of stress due to financial issues with his work. Apparently they're slowing down she is at work, and these had to take some time off. This was following his unexpected bleed long hospitalization for multivessel PCI in all the bills associated with that. He's also been reminiscing about the one-year anniversary of his mother's death.  He actually dropped out of cardiac rehabilitation due to issues with work, but it really seemed to be that they were more concerned with his blood glucoses and allow him to walk very frustrated that he is not allowed to walk due to his blood sugars. He denies any other episodes of chest discomfort be on yesterday. He displaced he says these had lack of energy over last few months where he is more fatigued and short of breath. He can walk on his legs his gait 100 sore and tired. He does note however that the swelling in his legs and  peripheral neuropathy because it is of much improved ever since his PCI. His edema has gotten better and he is not having the leg discomfort at all. He is not having any claudication symptoms. He actually notes less shortness of breath with exertion. He denies anal angina with exertion and is visiting his decreased activity.  He denies any PND, orthopnea and notes significantly improved edema.  No lightheadedness, dizziness, weakness or syncope/near-syncope. No melena, hematochezia or hematuria.  No claudication.  Blood pressure home range from 112/60-130/72. Heart rates ranging in the 50s with oxygen saturations in high 90s. His weight as of April 3 was 316 pounds and most recently on July 2 was 298 pounds (that is his current weight here according to our scales). His lowest weight he had been 291 pounds on May 29 th.   Past Medical History  Diagnosis Date  . Diabetes mellitus   . Carotid artery occlusion   . Complication of anesthesia   . PONV (postoperative nausea and vomiting)     "[employer] [place]"  . Hypothyroidism   . Depression     situaltional   . Sleep apnea   . Neuropathy     notably improved following PCI with improved cardiac function  . CAD, multiple vessel 2003, 04/2012    status post PCI to LAD, circumflex-OM 2, RCA  . Obesity, Class II, BMI 35.0-39.9, with comorbidity (see actual BMI)     BMI  39; wgt loss efforts in place; seeing Dietician  . Dyslipidemia associated with type 2 diabetes mellitus   . GERD (gastroesophageal reflux disease)   . Chronic renal insufficiency, stage II (mild)   . Pulmonary hypertension 04/2012    PA pressure 70mhg  . HTN (hypertension)   . Chronic venous insufficiency     varicosities, no reflux; dopplers 04/14/12- valvular insufficiency in the R and L GSV  . CAD S/P percutaneous coronary angioplasty     see cath and PCI and post surgical history    Prior Cardiac Evaluation and Past Surgical History: Past Surgical History  Procedure  Laterality Date  . Umbilical hernia repair  2009    steel mesh insert  . Coronary angioplasty  12/21/2001    PTCA of the distal and mid AV groove circ, unsuccessful PTCA of second OM total occlusion, unsuccessful PTCA of the apical LAD total occlusion  . Colonscopy    . Carotid endarterectomy  2005    Right; recent carotid Dopplers notes elevated velocities.   . Back surgery  2005 x1    X2-2010  . Lumbar laminectomy/decompression microdiscectomy  01/07/2012    Procedure: LUMBAR LAMINECTOMY/DECOMPRESSION MICRODISCECTOMY 1 LEVEL;  Surgeon: KWinfield Cunas MD;  [facility];  Service: Neurosurgery;  Laterality: Bilateral;   Lumbar Three-Four Decompression  . Coronary stent placement  04/28/12    PCI to LAD (3x27mXience DES postdilated to 3.25) and circ prox and mid (2 overlappinmg 2.50m86m2mmXience DES postdilated to 2.750m2m. Cardiac catheterization  12/11/2001    significant 3V CAD, normal LV function  . Coronary angioplasty with stent placement  04/29/12    PCI to 3 RCA lesions, Promus Premiere 2.250mm14m distally, mid was 2.50mx2820mand proximally 2.75x16mm, 70m5-60%  . Doppler echocardiography  04/28/2012    poor quality study: EF estimated 60-65%; unable to assess diastolic function (previously noted to have diastolic dysfunction); severely dilated left atrium and mild right atrium; dilated IVC consistent with increased central venous pressure..  . Carotid doppler  04/27/2012    right internal carotid: Elevated velocities but no evidence of plaque. Left internal carotid 40-59%  . Abis  04/27/2012    mild bilateral arterial insufficiency    Allergies  Allergen Reactions  . Septra (Bactrim) Itching  . Penicillins Hives    Allergy to All cillin drugs  . Other     Walnuts, pecans     Current Outpatient Prescriptions  Medication Sig Dispense Refill  . acetaminophen (TYLENOL) 325 MG tablet Take 2 tablets (650 mg total) by mouth every 4 (four) hours as needed.      .  ALPRAZolam (XANAX) 0.5 MG tablet Take 0.5 mg by mouth 3 (three) times daily as needed. For anxiety      . aspirin EC 81 MG EC tablet Take 1 tablet (81 mg total) by mouth daily.      . atenoMarland Kitchenol (TENORMIN) 25 MG tablet Take 25 mg by mouth every evening.       . cholecalciferol (VITAMIN D) 1000 UNITS tablet Take 1,000 Units by mouth daily.       . escitMarland Kitchenlopram (LEXAPRO) 20 MG tablet TAKE 1 TABLET (20 MG TOTAL) BY MOUTH DAILY.  30 tablet  11  . esomeprazole (NEXIUM) 40 MG capsule Take 40 mg by mouth daily.       . felodipine (PLENDIL) 5 MG 24 hr tablet Take 1 tablet (5 mg total) by mouth daily.  90 tablet  1  . fenofibrate micronized (LOFIBRA) 134  MG capsule Take 1 capsule (134 mg total) by mouth daily before breakfast.  90 capsule  1  . fish oil-omega-3 fatty acids 1000 MG capsule Take 1,200 mg by mouth daily.       . furosemide (LASIX) 40 MG tablet Take 1 tablet (40 mg total) by mouth daily.      . furosemide (LASIX) 40 MG tablet TAKE 1 TABLET (40 MG TOTAL) BY MOUTH DAILY.  30 tablet  4  . gabapentin (NEURONTIN) 600 MG tablet Take 600-1,200 mg by mouth 3 (three) times daily. 1 caps in the am, 1 cap midday,2 caps at night      . Garlic 756 MG CAPS Take 500 mg by mouth daily.      . insulin aspart (NOVOLOG) 100 UNIT/ML injection Patient is to take 45 units prior to each meal which is 3 times a day.      . insulin NPH (HUMULIN N,NOVOLIN N) 100 UNIT/ML injection Inject 30-50 Units into the skin 3 (three) times daily before meals. 30 in the am, 75 in evening      . levothyroxine (SYNTHROID, LEVOTHROID) 75 MCG tablet Take 75 mcg by mouth daily before breakfast.       . lisinopril (PRINIVIL,ZESTRIL) 20 MG tablet Take 1 tablet (20 mg total) by mouth daily.      . metFORMIN (GLUCOPHAGE) 1000 MG tablet Take 1 tablet (1,000 mg total) by mouth 2 (two) times daily with a meal.      . Misc Natural Products (LUTEIN 20 PO) Take 20 mg by mouth daily.      . Multiple Vitamins-Minerals (MULTIVITAMIN WITH MINERALS)  tablet Take 1 tablet by mouth daily.      . nitroGLYCERIN (NITROSTAT) 0.4 MG SL tablet Place 1 tablet (0.4 mg total) under the tongue every 5 (five) minutes as needed for chest pain (severe chest pressure or tightness).  25 tablet  2  . Potassium 99 MG TABS Take 99 mg by mouth daily.      . pregabalin (LYRICA) 75 MG capsule Take 100 mg by mouth 3 (three) times daily. Pt takes 1 pill QAM and 2 pills QHS      . simvastatin (ZOCOR) 40 MG tablet Take 40 mg by mouth every evening.      . Ticagrelor (BRILINTA) 90 MG TABS tablet Take 1 tablet (90 mg total) by mouth 2 (two) times daily.  60 tablet  11  . zolpidem (AMBIEN) 10 MG tablet TAKE 1 TABLET BY MOUTH AT BEDTIME AS NEEDED  30 tablet  0  . [DISCONTINUED] insulin lispro (HUMALOG) 100 UNIT/ML injection Inject 25 Units into the skin 3 (three) times daily before meals.       No current facility-administered medications for this visit.    History   Social History  . Marital Status: Single    Spouse Name: N/A    Number of Children: N/A  . Years of Education: N/A   Occupational History  . Not on file.   Social History Main Topics  . Smoking status: Former Smoker -- 55 years    Quit date: 03/23/2003  . Smokeless tobacco: Never Used  . Alcohol Use: No  . Drug Use: No  . Sexually Active: Not on file   Other Topics Concern  . Not on file   Social History Narrative   He is a single gentleman who is here with his significant other, who is also a patient of our clinic. He is no longer undergoing cardiac rehabilitation, but  is doing routine exercise.          ROS: A comprehensive Review of Systems - Negative except pertinent positives noted above. Improved venous stasis dermatitis and peripheral neuropathy. He is to have mild diarrhea but no melena or hematochezia. Generalized weakness and low energy noted. bilateral trigger finger pain of both hands. An evaluation for surgery.  PHYSICAL EXAM BP 112/62  Pulse 49  Ht _0  (1.854 m)  Wt  298 lb 8 oz (135.399 kg)  BMI 39.39 kg/m2 General: now moderately obese gentleman in no acute distress. Alert and oriented x3. Well groomed, otherwise healthy-appearing. Answers questions appropriate. Normal mood and affect. HEENT: NCAT. EOMI. MMM. Anicteric sclerae. He has kind of a rounded, puffy face but there are no significant abnormalities.  Neck: Supple with no LAN, JVD, or carotid bruit.  Heart: RRR. Normal S1, S2. No M/R/G. Nondisplaced PMI.  Lungs: CTAB. Nonlabored. Normal effort. Good air movement. Abdomen: Morbidly obese but otherwise soft/NT/ND/NABS. No HSM.  Extremities: trace right greater than left edema.; residual venous stasis dermatitis noted but no longer erythematous. Normal gait.  VPX:TGGYIRSWN today: Yes Rate:49 , Rhythm: sinus bradycardia at, otherwise normal ECG.  Recent Labs:  ASSESSMENT / PLAN: Continues to do well post PCI. Many of his symptoms have significantly improved. He will call back in about a month to let us know to have decreased dose of beta blocker and we'll decrease his Zocor at that time.  CAD, LAD/CFX DES 04/28/12- staged RCA DES 04/29/12 after pt declined CABG He is on a pretty good regimen for this with a beta blocker, ACE inhibitor and Zocor as well as dual antiplatelet therapy. He is also on a calcium channel blocker for antianginal effect and hypertension. He does have some intermittent episodes of chest discomfort which may very well be from small vessel disease but he has no signs of ongoing active angina. He is relatively active as far as exercise goes. It also seems that the improve cardiac function but he is systolic or diastolic has improved his lower extremity edema.  Fatigue From a cardiac standpoint, there are couple possibilities here. He is a somewhat bradycardic on the current dose of atenolol. This could be giving somewhat to sequential incompetence. Number decrease his dose to one half. We may actually fully discontinue it at his  followup visit and maybe use a medication with less side effect profile perhaps Bystolic. Am also concerned that the with his legs hurting with walking at this could be a good deal given the myalgias from the simvastatin. I will see how he does first with the atenolol in half for a month then after that I want to decrease his simvastatin to one half dose. If that improves his symptoms, I may very well to switch him over from simvastatin to a different statin with lesser side effect profile. He definitely needs to continue on a statin therapy.  Hyperlipidemia His levels are being followed by his primary physician along with his diabetes. Please note that change for lowering his simvastatin half. I think there may be some concern for the interaction with felodipine with dyshidrosis of a statin. It may just be best if his symptoms improve with a half dose of simvastatin to switch him to either atorvastatin or rosuvastatin. We can reassess this and see him back, or he can be managed by his primary physician.  Hypertension Quite well controlled. It may go up with his lower dose of atenolol. If that'll be the case with  him being a diabetic with diastolic dysfunction, the best option would be to increase his ACE inhibitor for better afterload reduction.  Pulmonary hypertension, 90mhg This is likely combination of OSA, as well as possibly obesity hypoventilation and pulmonary venous congestion from elevated LVEDP. It seems like this is much improved with the group diastolic function after revascularization. His edema is all but gone as has the venous stasis changes. He remains on a low-dose diuretic to keep it that way.  Obesity (BMI 30-39.9) He continues to outstandingly well now at least 13-14 pounds down from his PCI - and is no longer in a morbidly obese category. He is seeing a rFirefighter and has adjusted his diet. His also increase his exercise level. His goal should be to lose 1/10 of this  premorbid weight of roughly 320 pounds in the first year. He is almost halfway there at the almost four-month follow-up.  Neuropathy This is a dramatically improved after his PCI. The edema has gone down significantly. Continue on Lasix to keep it that way.  Sleep apnea on C-pap Continues to do well on CPAP. I think it is very important for him to continue to use that.  Preoperative cardiovascular examination Status post recent revascularization with no active cardiac symptoms. Would below risk for low risk surgery. No further evaluation will be required. Would continue on current medications. The only issue would be that I would not want him to hold his dual antiplatelet therapy.I would be willing to convert to Plavix from Effient. Due to the extent of his stented vasculature, I would not want him to stop dual echo platelets for at least one year. If this is a loss, we would then need to consider bridging with IV Integrilin.   Per problem list. Orders Placed This Encounter  Procedures  . EKG 12-Lead   No orders of the defined types were placed in this encounter.    Follow up: 4 months  Ieesha Abbasi W. HEllyn Hack M.D., M.S. [facility] [address] [place]  [id number] [phone] # [phone]

## 2012-08-25 ENCOUNTER — Ambulatory Visit (INDEPENDENT_AMBULATORY_CARE_PROVIDER_SITE_OTHER): Payer: PRIVATE HEALTH INSURANCE | Admitting: Family Medicine

## 2012-08-25 VITALS — BP 140/72 | HR 62 | Temp 97.5°F | Resp 18 | Ht 73.0 in | Wt 299.2 lb

## 2012-08-25 DIAGNOSIS — L255 Unspecified contact dermatitis due to plants, except food: Secondary | ICD-10-CM

## 2012-08-25 DIAGNOSIS — L237 Allergic contact dermatitis due to plants, except food: Secondary | ICD-10-CM

## 2012-08-25 DIAGNOSIS — R21 Rash and other nonspecific skin eruption: Secondary | ICD-10-CM

## 2012-08-25 MED ORDER — METHYLPREDNISOLONE ACETATE 80 MG/ML IJ SUSP
80.0000 mg | Freq: Once | INTRAMUSCULAR | Status: AC
  Administered 2012-08-25: 80 mg via INTRAMUSCULAR

## 2012-08-25 NOTE — Progress Notes (Signed)
Patient ID: Harold [name] MRN: [id number], DOB: 1955/08/04, 57 y.o. Date of Encounter: 08/25/2012, 6:46 PM  Primary Physician: [name], MD  Chief Complaint: Pruritic rash  HPI: 57 y.o. year old male with presents with 5 day history of mildly erythematous pruritic rash along left leg and right arm. Patient was doing yard work prior to the development of the rash. Known poison ivy in the vicinity. Has not yet washed all clothing or linens that have been exposed. Lesions now weeping clear fluid. Has tried benadryl cream.  Problem with itching is worsening  Patient is otherwise doing well without issues or complaints.  Past Medical History  Diagnosis Date  . Diabetes mellitus   . Carotid artery occlusion   . Complication of anesthesia   . PONV (postoperative nausea and vomiting)     "Patch Works"  . Hypothyroidism   . Depression     situaltional   . Sleep apnea   . Neuropathy   . CAD (coronary artery disease) 2003, 04/2012    echo 09/7562-PP normal, diastolic dysfunction, mild aortic root dilatation; myoview 07/2007-low risk, mild -mod defect due to attenuation with mild superimposed ischemia in the basal inferoseptal, basal inferior, mid inferoseptal, mid inferior and apical inferior region  . Morbid obesity with BMI of 40.0-44.9, adult     BMI 42; wgt loss efforts in place; seeing Dietician  . Dyslipidemia associated with type 2 diabetes mellitus   . GERD (gastroesophageal reflux disease)   . Chronic renal insufficiency, stage II (mild)   . Pulmonary hypertension 04/2012    PA pressure 29mhg  . HTN (hypertension)   . Chronic venous insufficiency     varicosities, no reflux; dopplers 04/14/12- valvular insufficiency in the R and L GSV     Home Meds: Prior to Admission medications   Medication Sig Start Date End Date Taking? Authorizing Provider  acetaminophen (TYLENOL) 325 MG tablet Take 2 tablets (650 mg total) by mouth every 4 (four) hours as needed. 04/30/12  Yes Luke K  Kilroy, PA-C  ALPRAZolam (Duanne Moron 0.5 MG tablet Take 0.5 mg by mouth 3 (three) times daily as needed. For anxiety   Yes Historical Provider, MD  aspirin EC 81 MG EC tablet Take 1 tablet (81 mg total) by mouth daily. 04/30/12  Yes Luke K Kilroy, PA-C  atenolol (TENORMIN) 25 MG tablet Take 25 mg by mouth every evening.    Yes Historical Provider, MD  cholecalciferol (VITAMIN D) 1000 UNITS tablet Take 1,000 Units by mouth daily.    Yes Historical Provider, MD  escitalopram (LEXAPRO) 20 MG tablet TAKE 1 TABLET (20 MG TOTAL) BY MOUTH DAILY. 05/02/12  Yes ADionne BucyMcClung, PA-C  esomeprazole (NEXIUM) 40 MG capsule Take 40 mg by mouth daily.    Yes Historical Provider, MD  felodipine (PLENDIL) 5 MG 24 hr tablet Take 1 tablet (5 mg total) by mouth daily. 07/02/12  Yes DLeonie Man MD  fenofibrate micronized (LOFIBRA) 134 MG capsule Take 1 capsule (134 mg total) by mouth daily before breakfast. 07/02/12  Yes DLeonie Man MD  fish oil-omega-3 fatty acids 1000 MG capsule Take 1,200 mg by mouth daily.    Yes Historical Provider, MD  furosemide (LASIX) 40 MG tablet Take 1 tablet (40 mg total) by mouth daily. 05/02/12  Yes Luke K Kilroy, PA-C  furosemide (LASIX) 40 MG tablet TAKE 1 TABLET (40 MG TOTAL) BY MOUTH DAILY. 07/06/12  Yes SDarlyne Russian MD  Garlic 5295MG CAPS Take 500  mg by mouth daily.   Yes Historical Provider, MD  insulin aspart (NOVOLOG) 100 UNIT/ML injection Patient is to take 45 units prior to each meal which is 3 times a day. 04/29/11  Yes [name], MD  insulin NPH (HUMULIN N,NOVOLIN N) 100 UNIT/ML injection Inject 30-50 Units into the skin 3 (three) times daily before meals. 30 in the am, 75 in evening   Yes Historical Provider, MD  levothyroxine (SYNTHROID, LEVOTHROID) 75 MCG tablet Take 75 mcg by mouth daily before breakfast.    Yes Historical Provider, MD  lisinopril (PRINIVIL,ZESTRIL) 20 MG tablet Take 1 tablet (20 mg total) by mouth daily. 05/02/12  Yes Erlene Quan, PA-C  metFORMIN  (GLUCOPHAGE) 1000 MG tablet Take 1 tablet (1,000 mg total) by mouth 2 (two) times daily with a meal. 05/02/12  Yes Erlene Quan, PA-C  Misc Natural Products (LUTEIN 20 PO) Take 20 mg by mouth daily.   Yes Historical Provider, MD  Multiple Vitamins-Minerals (MULTIVITAMIN WITH MINERALS) tablet Take 1 tablet by mouth daily.   Yes Historical Provider, MD  nitroGLYCERIN (NITROSTAT) 0.4 MG SL tablet Place 1 tablet (0.4 mg total) under the tongue every 5 (five) minutes as needed for chest pain (severe chest pressure or tightness). 04/30/12  Yes Luke K Kilroy, PA-C  Potassium 99 MG TABS Take 99 mg by mouth daily.   Yes Historical Provider, MD  pregabalin (LYRICA) 75 MG capsule Take 100 mg by mouth 3 (three) times daily. Pt takes 1 pill QAM and 2 pills QHS   Yes Historical Provider, MD  simvastatin (ZOCOR) 40 MG tablet Take 40 mg by mouth every evening.   Yes Historical Provider, MD  Ticagrelor (BRILINTA) 90 MG TABS tablet Take 1 tablet (90 mg total) by mouth 2 (two) times daily. 04/30/12  Yes Luke K Kilroy, PA-C  zolpidem (AMBIEN) 10 MG tablet TAKE 1 TABLET BY MOUTH AT BEDTIME AS NEEDED 07/22/12  Yes [name], MD  gabapentin (NEURONTIN) 600 MG tablet Take 600-1,200 mg by mouth 3 (three) times daily. 1 caps in the am, 1 cap midday,2 caps at night 04/29/11   [name], MD    Allergies:  Allergies  Allergen Reactions  . Septra (Bactrim) Itching  . Penicillins Hives    Allergy to All cillin drugs  . Other     Walnuts, pecans     History   Social History  . Marital Status: Single    Spouse Name: N/A    Number of Children: N/A  . Years of Education: N/A   Occupational History  . Not on file.   Social History Main Topics  . Smoking status: Former Smoker -- 59 years    Quit date: 03/23/2003  . Smokeless tobacco: Never Used  . Alcohol Use: No  . Drug Use: No  . Sexually Active: Not on file   Other Topics Concern  . Not on file   Social History Narrative  . No narrative on file      Review of Systems:  Constitutional: negative for chills, fever, night sweats, weight changes, or fatigue  HEENT: negative for vision changes, hearing loss, congestion, rhinorrhea, ST, epistaxis, or sinus pressure Cardiovascular: negative for chest pain or palpitations Respiratory: negative for hemoptysis, wheezing, shortness of breath, or cough Abdominal: negative for abdominal pain, nausea, vomiting, diarrhea, or constipation Dermatological: see above Neurologic: negative for headache, dizziness, or syncope   Physical Exam:  Blood pressure 140/72, pulse 62, temperature 97.5 F (36.4 C), temperature source  Oral, resp. rate 18, height 6' 1" (1.854 m), weight 299 lb 3.2 oz (135.716 kg), SpO2 96.00%., Body mass index is 39.48 kg/(m^2). General: Well developed, well nourished, in no acute distress. Head: Normocephalic, atraumatic, eyes without discharge, sclera non-icteric, nares are without discharge. Bilateral auditory canals clear, TM's are without perforation, pearly grey and translucent with reflective cone of light bilaterally. Oral cavity moist, posterior pharynx without exudate, erythema, peritonsillar abscess, or post nasal drip.  Neck: Supple. No thyromegaly. Full ROM. No lymphadenopathy. Lungs: Clear bilaterally to auscultation without wheezes, rales, or rhonchi. Breathing is unlabored. Heart: RRR with S1 S2. No murmurs, rubs, or gallops appreciated. Msk:  Strength and tone normal for age. Extremities/Skin: Warm and dry. Multiple vesicular weeping lesions along right deltoid and left lateral leg consistent with poison ivy. No secondary infections present. No clubbing or cyanosis. No edema.  Neuro: Alert and oriented X 3. Moves all extremities spontaneously. Gait is normal. CNII-XII grossly in tact. Psych:  Responds to questions appropriately with a normal affect.   Labs:   ASSESSMENT AND PLAN:  57 y.o. year old male with poison ivy Poison ivy dermatitis - Plan:  methylPREDNISolone acetate (DEPO-MEDROL) injection 80 mg    -Zantac -Benadryl -Wash all contaminated clothes and linens -RTC 10 days if symptoms persist, sooner if they worsen   Signed, Robyn Haber, MD 08/25/2012 6:46 PM

## 2012-08-25 NOTE — Patient Instructions (Addendum)
Poison [employer] ivy is a inflammation of the skin (contact dermatitis) caused by touching the allergens on the leaves of the ivy plant following previous exposure to the plant. The rash usually appears 48 hours after exposure. The rash is usually bumps (papules) or blisters (vesicles) in a linear pattern. Depending on your own sensitivity, the rash may simply cause redness and itching, or it may also progress to blisters which may break open. These must be well cared for to prevent secondary bacterial (germ) infection, followed by scarring. Keep any open areas dry, clean, dressed, and covered with an antibacterial ointment if needed. The eyes may also get puffy. The puffiness is worst in the morning and gets better as the day progresses. This dermatitis usually heals without scarring, within 2 to 3 weeks without treatment. HOME CARE INSTRUCTIONS  Thoroughly wash with soap and water as soon as you have been exposed to poison ivy. You have about one half hour to remove the plant resin before it will cause the rash. This washing will destroy the oil or antigen on the skin that is causing, or will cause, the rash. Be sure to wash under your fingernails as any plant resin there will continue to spread the rash. Do not rub skin vigorously when washing affected area. Poison ivy cannot spread if no oil from the plant remains on your body. A rash that has progressed to weeping sores will not spread the rash unless you have not washed thoroughly. It is also important to wash any clothes you have been wearing as these may carry active allergens. The rash will return if you wear the unwashed clothing, even several days later. Avoidance of the plant in the future is the best measure. Poison ivy plant can be recognized by the number of leaves. Generally, poison ivy has three leaves with flowering branches on a single stem. Diphenhydramine may be purchased over the counter and used as needed for itching. Do not drive with  this medication if it makes you drowsy.Ask your caregiver about medication for children. SEEK MEDICAL CARE IF:  Open sores develop.  Redness spreads beyond area of rash.  You notice purulent (pus-like) discharge.  You have increased pain.  Other signs of infection develop (such as fever). Document Released: 02/02/2000 Document Revised: 04/29/2011 Document Reviewed: 12/21/2008 [facility] Patient Information 2014 [place], [place].

## 2012-08-26 ENCOUNTER — Encounter (HOSPITAL_COMMUNITY): Payer: PRIVATE HEALTH INSURANCE

## 2012-08-28 ENCOUNTER — Encounter (HOSPITAL_COMMUNITY): Payer: PRIVATE HEALTH INSURANCE

## 2012-09-01 ENCOUNTER — Encounter: Payer: Self-pay | Admitting: Cardiology

## 2012-09-01 DIAGNOSIS — R531 Weakness: Secondary | ICD-10-CM | POA: Insufficient documentation

## 2012-09-01 DIAGNOSIS — R5383 Other fatigue: Secondary | ICD-10-CM | POA: Insufficient documentation

## 2012-09-01 DIAGNOSIS — Z01810 Encounter for preprocedural cardiovascular examination: Secondary | ICD-10-CM | POA: Insufficient documentation

## 2012-09-01 NOTE — Assessment & Plan Note (Signed)
He is on a pretty good regimen for this with a beta blocker, ACE inhibitor and Zocor as well as dual antiplatelet therapy. He is also on a calcium channel blocker for antianginal effect and hypertension. He does have some intermittent episodes of chest discomfort which may very well be from small vessel disease but he has no signs of ongoing active angina. He is relatively active as far as exercise goes. It also seems that the improve cardiac function but he is systolic or diastolic has improved his lower extremity edema.

## 2012-09-01 NOTE — Assessment & Plan Note (Signed)
Status post recent revascularization with no active cardiac symptoms. Would below risk for low risk surgery. No further evaluation will be required. Would continue on current medications. The only issue would be that I would not want him to hold his dual antiplatelet therapy.I would be willing to convert to Plavix from Effient. Due to the extent of his stented vasculature, I would not want him to stop dual echo platelets for at least one year. If this is a loss, we would then need to consider bridging with IV Integrilin.

## 2012-09-01 NOTE — Assessment & Plan Note (Signed)
His levels are being followed by his primary physician along with his diabetes. Please note that change for lowering his simvastatin half. I think there may be some concern for the interaction with felodipine with dyshidrosis of a statin. It may just be best if his symptoms improve with a half dose of simvastatin to switch him to either atorvastatin or rosuvastatin. We can reassess this and see him back, or he can be managed by his primary physician.

## 2012-09-01 NOTE — Assessment & Plan Note (Signed)
Continues to do well on CPAP. I think it is very important for him to continue to use that.

## 2012-09-01 NOTE — Assessment & Plan Note (Signed)
From a cardiac standpoint, there are couple possibilities here. He is a somewhat bradycardic on the current dose of atenolol. This could be giving somewhat to sequential incompetence. Number decrease his dose to one half. We may actually fully discontinue it at his followup visit and maybe use a medication with less side effect profile perhaps Bystolic. Am also concerned that the with his legs hurting with walking at this could be a good deal given the myalgias from the simvastatin. I will see how he does first with the atenolol in half for a month then after that I want to decrease his simvastatin to one half dose. If that improves his symptoms, I may very well to switch him over from simvastatin to a different statin with lesser side effect profile. He definitely needs to continue on a statin therapy.

## 2012-09-01 NOTE — Assessment & Plan Note (Signed)
This is a dramatically improved after his PCI. The edema has gone down significantly. Continue on Lasix to keep it that way.

## 2012-09-01 NOTE — Assessment & Plan Note (Signed)
This is likely combination of OSA, as well as possibly obesity hypoventilation and pulmonary venous congestion from elevated LVEDP. It seems like this is much improved with the group diastolic function after revascularization. His edema is all but gone as has the venous stasis changes. He remains on a low-dose diuretic to keep it that way.

## 2012-09-01 NOTE — Assessment & Plan Note (Signed)
Quite well controlled. It may go up with his lower dose of atenolol. If that'll be the case with him being a diabetic with diastolic dysfunction, the best option would be to increase his ACE inhibitor for better afterload reduction.

## 2012-09-01 NOTE — Assessment & Plan Note (Addendum)
He continues to outstandingly well now at least 13-14 pounds down from his PCI - and is no longer in a morbidly obese category. He is seeing a Firefighter, and has adjusted his diet. His also increase his exercise level. His goal should be to lose 1/10 of this premorbid weight of roughly 320 pounds in the first year. He is almost halfway there at the almost four-month follow-up.

## 2012-09-08 ENCOUNTER — Ambulatory Visit (INDEPENDENT_AMBULATORY_CARE_PROVIDER_SITE_OTHER): Payer: PRIVATE HEALTH INSURANCE | Admitting: Emergency Medicine

## 2012-09-08 VITALS — BP 132/74 | HR 58 | Temp 97.6°F | Resp 18 | Ht 73.0 in | Wt 292.0 lb

## 2012-09-08 DIAGNOSIS — R1310 Dysphagia, unspecified: Secondary | ICD-10-CM

## 2012-09-08 DIAGNOSIS — E785 Hyperlipidemia, unspecified: Secondary | ICD-10-CM

## 2012-09-08 DIAGNOSIS — E119 Type 2 diabetes mellitus without complications: Secondary | ICD-10-CM

## 2012-09-08 DIAGNOSIS — T781XXA Other adverse food reactions, not elsewhere classified, initial encounter: Secondary | ICD-10-CM

## 2012-09-08 MED ORDER — ESOMEPRAZOLE MAGNESIUM 40 MG PO CPDR: 40.0000 mg | DELAYED_RELEASE_CAPSULE | Freq: Every day | ORAL | Status: DC

## 2012-09-08 NOTE — Progress Notes (Signed)
  Subjective:    Patient ID: Harold [name], male    DOB: 10/30/1955, 57 y.o.   MRN: [id number]  HPI problem #1 is a difficulty when he swallows water that things seem to get stuck in his throat. Problem #2 is after eating he frequently has loose stools. Problem #3 is his diabetes which has been under poor control. Has an appointment to see Dr. Betsey Amen in the near future. Problem number 4 is obstructive sleep apnea and he continues on a CPAP machine without difficulty problem #5 is carpal tunnel syndrome and he is seeing Dr. Amedeo Plenty and scheduled to have surgery    Review of Systems     Objective:   Physical Exam patient is alert and cooperative and in no distress. His neck is supple. His chest is clear constipation and percussion heart regular rate no murmurs or gallops appreciated abdomen is soft liver spleen not enlarged         Assessment & Plan:  Referral made for allergy testing to be sure he is not developing food allergies. From a back to see his GI doctor to evaluate the difficulty with swallowing and fluid collecting  in his throat as well as difficulty with diarrhea after eating. He is cleared from a medical standpoint to have carpal tunnel surgery but will the Claritin Dr. Ellyn Hack regarding this. And they need to be notified of his sleep apnea. I decreased his Lexapro to three-quarter tablet a day for 2 weeks and then down to a half tablet a day to see if that would help with his fatigue

## 2012-09-08 NOTE — Patient Instructions (Signed)
Decrease her Lexapro to three-quarter tablet a day for 2 weeks and then decrease to a half tablet a day. I am making you an appointment to see the allergist and Dr. Benson Norway. The Nexium was refilled today to

## 2012-09-25 ENCOUNTER — Ambulatory Visit (INDEPENDENT_AMBULATORY_CARE_PROVIDER_SITE_OTHER): Payer: PRIVATE HEALTH INSURANCE | Admitting: Emergency Medicine

## 2012-09-25 ENCOUNTER — Telehealth: Payer: Self-pay | Admitting: Cardiology

## 2012-09-25 VITALS — BP 120/62 | HR 67 | Temp 97.4°F | Resp 18 | Ht 73.5 in | Wt 289.0 lb

## 2012-09-25 DIAGNOSIS — H1130 Conjunctival hemorrhage, unspecified eye: Secondary | ICD-10-CM

## 2012-09-25 DIAGNOSIS — H1132 Conjunctival hemorrhage, left eye: Secondary | ICD-10-CM

## 2012-09-25 NOTE — Patient Instructions (Addendum)
Be sure that you see Dr. Gershon Crane on Monday

## 2012-09-25 NOTE — Telephone Encounter (Signed)
Spoke w/ Dr. Everlene Farrier.  Stated pt w/ 80% Left Subconjunctival Hemorrhage and wants to know if pt to continue Brilinta or if it can be stopped.  Informed Dr. Ellyn Hack is out of the office and provider will be notified to review and advise.  Dr. Everlene Farrier gave cell number to call back.  Dr. Debara Pickett notified and will call Dr. Everlene Farrier.  Dr. Debara Pickett unable to reach Dr. Everlene Farrier.  Advised pt should continue Brilinta.  Call to Dr. Everlene Farrier and informed per Dr. Debara Pickett.  Verbalized understanding and stated he will refer pt to Dr. Gershon Crane r/t an active bleed for treatment.

## 2012-09-25 NOTE — Telephone Encounter (Signed)
Angie from Dr. Perfecto Kingdom office calling in regards to [name].

## 2012-09-25 NOTE — Progress Notes (Signed)
  Subjective:    Patient ID: Harold [name], male    DOB: 03/15/1955, 57 y.o.   MRN: [id number]  HPI  Patient comes into our office today with his left eye red he noticed it this around 12:00pm  No HX of injury to his eye No straining a little bit of sneezing and blowing his nose off/on from allergies or dust No pain no vision change a little bit of swelling Patient on a blood thinner Was itchy 2 days ago but no drainage no pain doesn't feel like anything is in his eye  Earlier during the week it felt like it was something in the left corner of his eye but when he went to rub it it felt better like there wasn't nothing in it No pets in the house only worked outside watering plants Monday  Review of Systems  Eyes: Positive for redness and itching. Negative for photophobia, pain and visual disturbance.       Objective:   Physical Exam there is a significant left subconjunctival hemorrhage. The cornea is clear. The disc is normal. Visual acuity is 20/30. Fields are full to confrontation.       Assessment & Plan:  I called his cardiologist who said he had to stay on his antiplatelet therapy. He will use ice pack today he will follow up with Dr. Gershon Crane on Monday he will present here immediately if he develops any loss of vision in the left eye.

## 2012-10-21 ENCOUNTER — Telehealth: Payer: Self-pay | Admitting: *Deleted

## 2012-10-21 NOTE — Telephone Encounter (Signed)
Faxed pre -op  Clearance to [place] for right fingers surgery. Called left a message on voicemail

## 2012-10-23 ENCOUNTER — Telehealth: Payer: Self-pay | Admitting: *Deleted

## 2012-10-23 NOTE — Telephone Encounter (Signed)
Dr Annie Main  Office called - wanted to inform us that the patient was not on Clopidogrel but taking Brilinta . Dr Annie Main was going to check to see if he is able to due surgery while patient is still taking medication.

## 2012-11-17 ENCOUNTER — Encounter: Payer: Self-pay | Admitting: Cardiology

## 2012-11-17 ENCOUNTER — Ambulatory Visit (INDEPENDENT_AMBULATORY_CARE_PROVIDER_SITE_OTHER): Payer: PRIVATE HEALTH INSURANCE | Admitting: Cardiology

## 2012-11-17 VITALS — BP 104/60 | HR 56 | Ht 73.0 in | Wt 280.4 lb

## 2012-11-17 DIAGNOSIS — I2510 Atherosclerotic heart disease of native coronary artery without angina pectoris: Secondary | ICD-10-CM

## 2012-11-17 DIAGNOSIS — E785 Hyperlipidemia, unspecified: Secondary | ICD-10-CM

## 2012-11-17 DIAGNOSIS — E119 Type 2 diabetes mellitus without complications: Secondary | ICD-10-CM

## 2012-11-17 DIAGNOSIS — G4730 Sleep apnea, unspecified: Secondary | ICD-10-CM

## 2012-11-17 DIAGNOSIS — R5381 Other malaise: Secondary | ICD-10-CM

## 2012-11-17 DIAGNOSIS — I10 Essential (primary) hypertension: Secondary | ICD-10-CM

## 2012-11-17 DIAGNOSIS — E669 Obesity, unspecified: Secondary | ICD-10-CM

## 2012-11-17 DIAGNOSIS — F329 Major depressive disorder, single episode, unspecified: Secondary | ICD-10-CM

## 2012-11-17 DIAGNOSIS — R5383 Other fatigue: Secondary | ICD-10-CM

## 2012-11-17 MED ORDER — LISINOPRIL 20 MG PO TABS: 10.0000 mg | ORAL_TABLET | Freq: Every day | ORAL | Status: DC

## 2012-11-17 MED ORDER — ATENOLOL 25 MG PO TABS: 12.5000 mg | ORAL_TABLET | Freq: Every evening | ORAL | Status: DC

## 2012-11-17 MED ORDER — SIMVASTATIN 40 MG PO TABS: 20.0000 mg | ORAL_TABLET | Freq: Every evening | ORAL | Status: DC

## 2012-11-17 NOTE — Assessment & Plan Note (Signed)
He brings with him a card showing his blood pressures over last 4-5 months.  There pressures are great.  He shows his heart rates are great.  He shows the continued weight loss. His blood pressure little low side today.  I think we can actually back off on lisinopril  to 10 mg

## 2012-11-17 NOTE — Assessment & Plan Note (Signed)
He is on a statin to reduce the dose.  He is also a fenofibrate for high triglycerides.  They have been followed by his primary physician. He is due to get labs checked this October.  I will forward the results.  I would anticipate him being much improved.

## 2012-11-17 NOTE — Assessment & Plan Note (Signed)
I am really impressed with his mood and affect today.  He really seems to have a better outlook on life.  He is feeling better about himself overall which is probably helping a lot with his overall care.

## 2012-11-17 NOTE — Assessment & Plan Note (Signed)
He has had a nearly 40 pounds weight loss since the beginning of this year.  In January he weighed 319 pounds, currently weighs 280 pounds.  I thoroughly congratulated on his efforts.

## 2012-11-17 NOTE — Patient Instructions (Signed)
You LOOK GREAT!!! - down ~ 40 pounds since January.  Lets reduce your Lisinopril to 10 mg (1/2 dose) -- your BP is running just a bit low.  I will be looking for your Cholesterol Labs in October. Lets stay on Brilinta for at least a 1 year time from stents -- we can change to Plavix after that.  CONGRATULATIONS!! KEEP IT UP!!! -- Keep working.  Leonie Man, MD  Your physician wants you to follow-up in: 6 months -- to be reassessed for possible Hand Surgery. You will receive a reminder letter in the mail two months in advance. If you don't receive a letter, please call our office to schedule the follow-up appointment.

## 2012-11-17 NOTE — Progress Notes (Signed)
Patient ID: Harold [name], male   DOB: October 25, 1955, 57 y.o.   MRN: [id number]  Clinic Note: HPI: Harold [name] is a 57 y.o. 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  He continues to follow up with Estelle Grumbles, our dietician about adjusting his diet.  He has definitely changed is diet and he is trying to get more active now.  Interval History: Since I last saw him he's been doing am easily well.  He continues to lose weight down to 280 pounds now.  He denies any chest tightness or chest pressure with rest or exertion.  He is walking from one of our colleagues at work.  He is exercising during work.  He is definitely modified his diet.  He now has a greater length outlook and feeling much better about himself and about his life.  His leg edema is significantly improved which is only minimal discoloration noted.  The neuropathy is gotten better as well.  He denies any dyspnea with rest or exertion.  No PND orthopnea or edema.  No palpitations or rapid heart rates.  His gastroparesis and nausea issues as well as bloating and gas involves result.  He is tolerating CPAP very well.  He does get a little bit dizzy occasionally when he gets really fast.  Cardiovascular ROS: no chest pain or dyspnea on exertion negative for - edema, irregular heartbeat, loss of consciousness, murmur, orthopnea, palpitations, paroxysmal nocturnal dyspnea, rapid heart rate or shortness of breath No melena, hematochezia or hematuria.  No TIA or amaurosis fugax symptoms.  No claudication symptoms.  Past Medical History  Diagnosis Date  . Diabetes mellitus   . Carotid artery occlusion   . Complication of anesthesia   . PONV (postoperative nausea and vomiting)     "[place]"  . Hypothyroidism   . Depression     situaltional   . Sleep apnea   .  Neuropathy     notably improved following PCI with improved cardiac function  . CAD, multiple vessel 2003, 04/2012    status post PCI to LAD, circumflex-OM 2, RCA  . 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 Dyslipidemia associated with type 2 diabetes mellitus   . GERD (gastroesophageal reflux disease)   . Chronic renal insufficiency, stage II (mild)   . Pulmonary hypertension 04/2012    PA pressure 66mhg  . HTN (hypertension)   . Chronic venous insufficiency     varicosities, no reflux; dopplers 04/14/12- valvular insufficiency in the R and L GSV  . CAD S/P percutaneous coronary angioplasty     see cath and PCI and post surgical history   Prior Cardiac Evaluation and Past Surgical History: Reviewed in Epic   Allergies  Allergen Reactions  . Septra [Bactrim] Itching  . Penicillins Hives    Allergy to All cillin drugs  . Other     Walnuts, pecans    Medications Reviewed in Epic.  History   Social History  . Marital Status: Single    Spouse Name: N/A    Number of Children: N/A  . Years of Education: N/A   Occupational History  . Not on file.   Social History Main Topics  . Smoking status: Former Smoker -- 459years  Quit date: 03/23/2003  . Smokeless tobacco: Never Used  . Alcohol Use: No  . Drug Use: No  . Sexual Activity: Not on file   Other Topics Concern  . Not on file   Social History Narrative   He is a single gentleman a long-term monogamous same sex relationship , who is also a patient of our clinic. He is no longer undergoing cardiac rehabilitation, but is doing routine exercise.   He works at [employer].   He has really picked up his exercise level.  He walks the platelet there is borderline which he could not come anywhere near doing 6 months ago.  He is out working in the yard.   He does not smoke, he quit in 2009.  He does not drink alcohol.    ROS: A  comprehensive Review of Systems - Negative except pertinent positives noted above. Improved venous stasis dermatitis and peripheral neuropathy.  Much improved diarrhea but no melena or hematochezia. Generalized weakness and low energy no longer present time he does have up and down days, but mostly up.  Bilateral trigger finger pain of both hands.  Back pain is better with weight loss.  PHYSICAL EXAM BP 104/60  Pulse 56  Ht _0  (1.854 m)  Wt 280 lb 6.4 oz (127.189 kg)  BMI 37 kg/m2 General: moderately obese gentleman in no acute distress. Alert and oriented x3. Well groomed, otherwise healthy-appearing. Answers questions appropriate. Normal mood and affect -- with a large amount of space that was new for him. HEENT: NCAT. EOMI. MMM. Anicteric sclerae. He has kind of a rounded, puffy face but there are no significant abnormalities.  Neck: Supple with no LAN, JVD, or carotid bruit.  Heart: RRR. Normal S1, S2. No M/R/G. Nondisplaced PMI.  Lungs: CTAB. Nonlabored. Normal effort. Good air movement. Abdomen: Morbidly obese but otherwise soft/NT/ND/NABS. No HSM.  Extremities: trace right greater than left edema.;  Minimal residual venous stasis dermatitis noted. Normal gait.  YKD:XIPJASNKN today: Yes Rate:49 , Rhythm: sinus bradycardia at, otherwise normal ECG.  Recent Labs:  ASSESSMENT / PLAN: Continues to do well post PCI. Many of his symptoms have significantly improved.  From his last visit we decreased his simvastatin to 20 mg daily, we also backed off on his beta blocker to 25 mg.  CAD, LAD/CFX DES 04/28/12- staged RCA DES 04/29/12 after pt declined CABG Doing very well with no active symptoms. Continue current dose of beta blocker, statin along with aspirin and Brilinta.  I may cut back his lisinopril due to his occlusion of dizziness and is mildly reduced blood pressure today.  He will need to be on dual antiplatelet therapy for a minimum one year.  He has been holding off on some  surgeries which will need to be told March of 2015.    Sleep apnea on C-pap Continues to do well on CPAP.  Obesity (BMI 30-39.9) He has had a nearly 40 pounds weight loss since the beginning of this year.  In January he weighed 319 pounds, currently weighs 280 pounds.  I thoroughly congratulated on his efforts.  Hypertension He brings with him a card showing his blood pressures over last 4-5 months.  There pressures are great.  He shows his heart rates are great.  He shows the continued weight loss. His blood pressure little low side today.  I think we can actually back off on lisinopril  to 10 mg  Hyperlipidemia He is on a statin to reduce the dose.  He is also a fenofibrate for high triglycerides.  They have been followed by his primary physician. He is due to get labs checked this October.  I will forward the results.  I would anticipate him being much improved.  Depression I am really impressed with his mood and affect today.  He really seems to have a better outlook on life.  He is feeling better about himself overall which is probably helping a lot with his overall care.   Per problem list. Orders Placed This Encounter  Procedures  . EKG 12-Lead   Meds ordered this encounter  Medications  . atenolol (TENORMIN) 25 MG tablet    Sig: Take 0.5 tablets (12.5 mg total) by mouth every evening.    Dispense:  30 tablet    Refill:  11  . simvastatin (ZOCOR) 40 MG tablet    Sig: Take 0.5 tablets (20 mg total) by mouth every evening.    Dispense:  30 tablet    Refill:  11  . lisinopril (PRINIVIL,ZESTRIL) 20 MG tablet    Sig: Take 0.5 tablets (10 mg total) by mouth daily.    Dispense:  30 tablet    Refill:  11    Order Specific Question:  Supervising Provider    Answer:  Sanda Klein [[id number]]    Follow up: 6 months; this will put him at one year from his PCI.  He can then discuss possibly stopping the second antiplatelet agent   DAVID W. Ellyn Hack, M.D., M.S. [facility] [address] [place], [place]  [address]  [phone] Pager # [phone]

## 2012-11-17 NOTE — Assessment & Plan Note (Addendum)
Doing very well with no active symptoms. Continue current dose of beta blocker, statin along with aspirin and Brilinta.  I may cut back his lisinopril due to his occlusion of dizziness and is mildly reduced blood pressure today.  He will need to be on dual antiplatelet therapy for a minimum one year.  He has been holding off on some surgeries which will need to be told March of 2015.

## 2012-11-17 NOTE — Assessment & Plan Note (Signed)
Continues to do well on CPAP.

## 2012-11-19 ENCOUNTER — Encounter (HOSPITAL_COMMUNITY): Payer: Self-pay

## 2012-11-19 ENCOUNTER — Other Ambulatory Visit (HOSPITAL_COMMUNITY): Payer: Self-pay

## 2012-11-19 ENCOUNTER — Ambulatory Visit: Payer: BC Managed Care – PPO | Admitting: Neurosurgery

## 2012-11-19 ENCOUNTER — Other Ambulatory Visit: Payer: BC Managed Care – PPO

## 2012-11-19 ENCOUNTER — Other Ambulatory Visit: Payer: Self-pay

## 2012-11-19 ENCOUNTER — Ambulatory Visit: Payer: Self-pay | Admitting: Family

## 2012-11-19 ENCOUNTER — Ambulatory Visit: Payer: Self-pay | Admitting: Neurosurgery

## 2012-12-02 ENCOUNTER — Encounter: Payer: Self-pay | Admitting: Cardiology

## 2012-12-02 ENCOUNTER — Inpatient Hospital Stay (HOSPITAL_COMMUNITY): Admission: RE | Admit: 2012-12-02 | Payer: Self-pay | Source: Ambulatory Visit

## 2012-12-02 ENCOUNTER — Ambulatory Visit: Payer: Self-pay | Admitting: Family

## 2012-12-03 ENCOUNTER — Encounter (HOSPITAL_COMMUNITY): Payer: Self-pay

## 2012-12-03 ENCOUNTER — Other Ambulatory Visit (HOSPITAL_COMMUNITY): Payer: Self-pay

## 2012-12-03 ENCOUNTER — Ambulatory Visit: Payer: Self-pay | Admitting: Family

## 2012-12-06 ENCOUNTER — Other Ambulatory Visit: Payer: Self-pay | Admitting: Emergency Medicine

## 2012-12-07 ENCOUNTER — Other Ambulatory Visit: Payer: Self-pay | Admitting: Emergency Medicine

## 2012-12-10 ENCOUNTER — Other Ambulatory Visit (HOSPITAL_COMMUNITY): Payer: Self-pay

## 2012-12-10 ENCOUNTER — Encounter (HOSPITAL_COMMUNITY): Payer: Self-pay

## 2012-12-10 ENCOUNTER — Ambulatory Visit: Payer: Self-pay | Admitting: Family

## 2012-12-31 ENCOUNTER — Ambulatory Visit: Payer: Self-pay | Admitting: Family

## 2012-12-31 ENCOUNTER — Encounter (HOSPITAL_COMMUNITY): Payer: Self-pay

## 2012-12-31 ENCOUNTER — Other Ambulatory Visit (HOSPITAL_COMMUNITY): Payer: Self-pay

## 2013-01-10 ENCOUNTER — Other Ambulatory Visit: Payer: Self-pay | Admitting: Emergency Medicine

## 2013-01-11 NOTE — Telephone Encounter (Signed)
Needs ov, labs

## 2013-01-12 ENCOUNTER — Ambulatory Visit: Payer: PRIVATE HEALTH INSURANCE | Admitting: Emergency Medicine

## 2013-02-09 ENCOUNTER — Other Ambulatory Visit: Payer: Self-pay | Admitting: Physician Assistant

## 2013-02-14 ENCOUNTER — Other Ambulatory Visit: Payer: Self-pay | Admitting: Cardiology

## 2013-02-15 NOTE — Telephone Encounter (Signed)
Rx was sent to pharmacy electronically.

## 2013-02-19 ENCOUNTER — Encounter: Payer: Self-pay | Admitting: Family

## 2013-02-22 ENCOUNTER — Ambulatory Visit (INDEPENDENT_AMBULATORY_CARE_PROVIDER_SITE_OTHER): Payer: PRIVATE HEALTH INSURANCE | Admitting: Family

## 2013-02-22 ENCOUNTER — Encounter: Payer: Self-pay | Admitting: Family

## 2013-02-22 ENCOUNTER — Ambulatory Visit (HOSPITAL_COMMUNITY)
Admission: RE | Admit: 2013-02-22 | Discharge: 2013-02-22 | Disposition: A | Discharge status: Home / Self Care | Payer: PRIVATE HEALTH INSURANCE | Source: Ambulatory Visit | Attending: Vascular Surgery | Admitting: Vascular Surgery

## 2013-02-22 ENCOUNTER — Ambulatory Visit (INDEPENDENT_AMBULATORY_CARE_PROVIDER_SITE_OTHER)
Admission: RE | Admit: 2013-02-22 | Discharge: 2013-02-22 | Disposition: A | Discharge status: Home / Self Care | Payer: PRIVATE HEALTH INSURANCE | Source: Ambulatory Visit | Attending: Vascular Surgery | Admitting: Vascular Surgery

## 2013-02-22 DIAGNOSIS — Z48812 Encounter for surgical aftercare following surgery on the circulatory system: Secondary | ICD-10-CM | POA: Insufficient documentation

## 2013-02-22 DIAGNOSIS — I739 Peripheral vascular disease, unspecified: Secondary | ICD-10-CM | POA: Insufficient documentation

## 2013-02-22 DIAGNOSIS — I6529 Occlusion and stenosis of unspecified carotid artery: Secondary | ICD-10-CM

## 2013-02-22 NOTE — Addendum Note (Signed)
Addended by: Mena Goes on: 02/22/2013 02:48 PM   Modules accepted: Orders

## 2013-02-22 NOTE — Progress Notes (Signed)
[facility] [place] HISTORY AND PHYSICAL   MRN : [id number]  History of Present Illness:   Harold [name] is a 58 y.o. male patient of Dr. Oneida Alar who presented for evaluation in Sept., 2013 of a moderate carotid stenosis as well as bilateral lower extremity pain. The patient previously had a right carotid endarterectomy in 2005 by Dr. Oneida Alar.  On 11/15/11 he underwent an aortogram with bilateral run-off by Dr. Oneida Alar, findings were mild diffuse tibial disease with no significant aortoiliac superficial femoral popliteal disease, no intervention necessary. He was counseled by Dr. Oneida Alar at that time re management of his DM and weight loss. He had 6 cardiac stents placed by Dr. Ellyn Hack in March, 2014, he denies having an MI. He does take Brilinta until March of this year, then states the plan is to stop Brilinta and start Plavix in March. He denies claudication symptoms with walking, denies non-healing wounds. He denies history of stroke or TIA symptoms. Uses his C-PAP nightly for OSA.  Pt Diabetic: Yes, states not in good control, but is working closely with Dr. Chalmers Cater to improve this. Pt smoker: former smoker, quit in 2005  Current Outpatient Prescriptions  Medication Sig Dispense Refill  . aspirin EC 81 MG EC tablet Take 1 tablet (81 mg total) by mouth daily.      Marland Kitchen atenolol (TENORMIN) 25 MG tablet Take 0.5 tablets (12.5 mg total) by mouth every evening.  30 tablet  11  . cholecalciferol (VITAMIN D) 1000 UNITS tablet Take 1,000 Units by mouth 4 (four) times daily.       Marland Kitchen escitalopram (LEXAPRO) 20 MG tablet TAKE 1 TABLET (20 MG TOTAL) BY MOUTH DAILY.  30 tablet  11  . esomeprazole (NEXIUM) 40 MG capsule Take 1 capsule (40 mg total) by mouth daily.  30 capsule  11  . felodipine (PLENDIL) 5 MG 24 hr tablet TAKE 1 TABLET (5 MG TOTAL) BY MOUTH DAILY.  90 tablet  2  . fenofibrate micronized (LOFIBRA) 134 MG capsule Take 1 capsule (134 mg total) by mouth daily before breakfast.   90 capsule  1  . fish oil-omega-3 fatty acids 1000 MG capsule Take 1,200 mg by mouth daily.       . furosemide (LASIX) 40 MG tablet TAKE 1 TABLET (40 MG TOTAL) BY MOUTH DAILY.  30 tablet  2  . Garlic 948 MG CAPS Take 500 mg by mouth daily.      . insulin aspart (NOVOLOG) 100 UNIT/ML injection Inject 40 Units into the skin daily.       . insulin NPH (HUMULIN N,NOVOLIN N) 100 UNIT/ML injection Inject 40 Units into the skin daily.       Marland Kitchen levothyroxine (SYNTHROID, LEVOTHROID) 75 MCG tablet Take 1 tablet (75 mcg total) by mouth daily. PATIENT NEEDS OFFICE VISIT/LABS FOR ADDITIONAL REFILLS - 2nd NOTICE  15 tablet  0  . lisinopril (PRINIVIL,ZESTRIL) 20 MG tablet Take 0.5 tablets (10 mg total) by mouth daily.  30 tablet  11  . metFORMIN (GLUCOPHAGE) 1000 MG tablet Take 1 tablet (1,000 mg total) by mouth 2 (two) times daily with a meal.      . Misc Natural Products (LUTEIN 20 PO) Take 20 mg by mouth daily.      . Multiple Vitamins-Minerals (MULTIVITAMIN WITH MINERALS) tablet Take 1 tablet by mouth daily.      . nitroGLYCERIN (NITROSTAT) 0.4 MG SL tablet Place 1 tablet (0.4 mg total) under the tongue every 5 (five) minutes as  needed for chest pain (severe chest pressure or tightness).  25 tablet  2  . simvastatin (ZOCOR) 40 MG tablet Take 0.5 tablets (20 mg total) by mouth every evening.  30 tablet  11  . Ticagrelor (BRILINTA) 90 MG TABS tablet Take 1 tablet (90 mg total) by mouth 2 (two) times daily.  60 tablet  11  . ALPRAZolam (XANAX) 0.5 MG tablet Take 0.5 mg by mouth 3 (three) times daily as needed. For anxiety      . furosemide (LASIX) 40 MG tablet Take 1 tablet (40 mg total) by mouth daily.      . Potassium 99 MG TABS Take 99 mg by mouth daily.      Marland Kitchen zolpidem (AMBIEN) 10 MG tablet TAKE 1 TABLET BY MOUTH AT BEDTIME AS NEEDED  30 tablet  0  . [DISCONTINUED] insulin lispro (HUMALOG) 100 UNIT/ML injection Inject 25 Units into the skin 3 (three) times daily before meals.       No current  facility-administered medications for this visit.    Pt meds include: Statin :Yes Betablocker: Yes ASA: Yes Other anticoagulants/antiplatelets: Brilinta for CAD  Past Medical History  Diagnosis Date  . Diabetes mellitus   . Carotid artery occlusion   . Complication of anesthesia   . PONV (postoperative nausea and vomiting)     "[place]"  . Hypothyroidism   . Depression     situaltional   . Sleep apnea   . Neuropathy     notably improved following PCI with improved cardiac function  . CAD, multiple vessel 2003, 04/2012    status post PCI to LAD, circumflex-OM 2, RCA  . 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 Dyslipidemia associated with type 2 diabetes mellitus   . GERD (gastroesophageal reflux disease)   . Chronic renal insufficiency, stage II (mild)   . Pulmonary hypertension 04/2012    PA pressure 12mhg  . HTN (hypertension)   . Chronic venous insufficiency     varicosities, no reflux; dopplers 04/14/12- valvular insufficiency in the R and L GSV  . CAD S/P percutaneous coronary angioplasty     see cath and PCI and post surgical history    Past Surgical History  Procedure Laterality Date  . Umbilical hernia repair  2009    steel mesh insert  . Coronary angioplasty  12/21/2001    PTCA of the distal and mid AV groove circ, unsuccessful PTCA of second OM total occlusion, unsuccessful PTCA of the apical LAD total occlusion  . Colonscopy    . Carotid endarterectomy  2005    Right; recent carotid Dopplers notes elevated velocities.   . Back surgery  2005 x1    X2-2010  . Lumbar laminectomy/decompression microdiscectomy  01/07/2012    Procedure: LUMBAR LAMINECTOMY/DECOMPRESSION MICRODISCECTOMY 1 LEVEL;  Surgeon: KWinfield Cunas MD;  [facility];  Service: Neurosurgery;  Laterality: Bilateral;   Lumbar Three-Four Decompression  . Coronary stent placement  04/28/12    PCI to  LAD (3x2567mXience DES postdilated to 3.25) and circ prox and mid (2 overlappinmg 2.67m66m2mmXience DES postdilated to 2.767m38m. Cardiac catheterization  12/11/2001    significant 3V CAD, normal LV function  . Coronary angioplasty with stent placement  04/29/12    PCI to 3 RCA lesions, Promus Premiere 2.267mm37m distally, mid was 2.67mx28567mand proximally 2.75x16mm, 23m5-60%  . Doppler echocardiography  04/28/2012  poor quality study: EF estimated 60-65%; unable to assess diastolic function (previously noted to have diastolic dysfunction); severely dilated left atrium and mild right atrium; dilated IVC consistent with increased central venous pressure..  . Carotid doppler  04/27/2012    right internal carotid: Elevated velocities but no evidence of plaque. Left internal carotid 40-59%  . Abis  04/27/2012    mild bilateral arterial insufficiency    Social History History  Substance Use Topics  . Smoking status: Former Smoker -- 8 years    Quit date: 03/23/2003  . Smokeless tobacco: Never Used  . Alcohol Use: No    Family History Family History  Problem Relation Age of Onset  . Adopted: Yes  . Family history unknown: Yes    Allergies  Allergen Reactions  . Septra [Bactrim] Itching  . Penicillins Hives    Allergy to All cillin drugs  . Other     Walnuts, pecans      REVIEW OF SYSTEMS: See HPI for pertinent positives and negatives.  Physical Examination Filed Vitals:   02/22/13 1404  BP: 135/79  Pulse: 68   Filed Weights   02/22/13 1404  Weight: 284 lb (128.822 kg)   Body mass index is 37.48 kg/(m^2).  General:  Obese male in NAD Gait: Normal HENT: WNL Eyes: Pupils equal and react to light accomodation. Pulmonary: normal non-labored breathing , without Rales, rhonchi,  wheezing Cardiac: RRR, without  Murmurs, rubs or gallops detected;  Abdomen: soft, NT, no masses Skin: no rashes, ulcers noted;  no Gangrene , no cellulitis; no open wounds;   VASCULAR  EXAM  Carotid Bruits Left Right   Negative Negative        Aorta is not palpable. Radial pulses are 2+ and =.                     VASCULAR EXAM: Extremities without ischemic changes  without Gangrene; without open wounds.                                                                                                          LE Pulses LEFT RIGHT       FEMORAL   palpable   palpable        POPLITEAL  not palpable   not palpable       POSTERIOR TIBIAL   palpable    palpable        DORSALIS PEDIS      ANTERIOR TIBIAL  palpable   palpable    Musculoskeletal: no muscle wasting or atrophy; 1+ pretibial pitting edema in RLE.  Neurologic: A&O X 3; Appropriate Affect ;  SENSATION: normal; MOTOR FUNCTION: 5/5 Symmetric, CN 2-12 intact Speech is fluent/normal   Non-Invasive Vascular Imaging (02/22/2013): Carotid Duplex: Right ICA, CEA site, is patent without restenosis. Left ICA Duplex indicates 40-59% stenosis. These findings are unchanged from 11/07/11.  ABI's: Right: 0.96, Left: 1.08, both are triphasic Previous ABI's (11/07/11): Right: 0.91, Left: 1.09  ASSESSMENT:  Harold [name] is a 58 y.o. male patient of Dr.  Fields who is s/p right carotid endarterectomy in 2005, has no further claudication symptoms, is working hard to lose weight and to get his DM under control. He has never had a stroke or TIA. His ABI's are normal in both legs and his carotid arteries Duplex is unchanged from 15 months ago: patent right ICA (CEA site) and mild/moderate stenosis in left ICA.  PLAN:   Return in 1 year for Carotid arteries Duplex. I discussed in depth with the patient the nature of atherosclerosis, and emphasized the importance of maximal medical management including strict control of blood pressure, blood glucose, and lipid levels, obtaining regular exercise, and continued cessation of smoking.  The patient is aware that without maximal medical management the underlying atherosclerotic  disease process will progress, limiting the benefit of any interventions.  The patient was given information about stroke prevention and what symptoms should prompt the patient to seek immediate medical care.  The patient was given information about PAD including signs, symptoms, treatment, what symptoms should prompt the patient to seek immediate medical care, and risk reduction measures to take.  Clemon Chambers, RN, MSN, FNP-C [facility] Office: [phone]  Clinic MD: Trula Slade 02/22/2013 2:03 PM

## 2013-02-22 NOTE — Patient Instructions (Signed)
Stroke Prevention Some medical conditions and behaviors are associated with an increased chance of having a stroke. You may prevent a stroke by making healthy choices and managing medical conditions. Reduce your risk of having a stroke by:  Staying physically active. Get at least 30 minutes of activity on most or all days.  Not smoking. It may also be helpful to avoid exposure to secondhand smoke.  Limiting alcohol use. Moderate alcohol use is considered to be:  No more than 2 drinks per day for men.  No more than 1 drink per day for nonpregnant women.  Eating healthy foods.  Include 5 or more servings of fruits and vegetables a day.  Certain diets may be prescribed to address high blood pressure, high cholesterol, diabetes, or obesity.  Managing your cholesterol levels.  A low-saturated fat, low-trans fat, low-cholesterol, and high-fiber diet may control cholesterol levels.  Take any prescribed medicines to control cholesterol as directed by your caregiver.  Managing your diabetes.  A controlled-carbohydrate, controlled-sugar diet is recommended to manage diabetes.  Take any prescribed medicines to control diabetes as directed by your caregiver.  Controlling your high blood pressure (hypertension).  A low-salt (sodium), low-saturated fat, low-trans fat, and low-cholesterol diet is recommended to manage high blood pressure.  Take any prescribed medicines to control hypertension as directed by your caregiver.  Maintaining a healthy weight.  A reduced-calorie, low-sodium, low-saturated fat, low-trans fat, low-cholesterol diet is recommended to manage weight.  Stopping drug abuse.  Avoiding birth control pills.  Talk to your caregiver about the risks of taking birth control pills if you are over 15 years old, smoke, get migraines, or have ever had a blood clot.  Getting evaluated for sleep disorders (sleep apnea).  Talk to your caregiver about getting a sleep evaluation  if you snore a lot or have excessive sleepiness.  Taking medicines as directed by your caregiver.  For some people, aspirin or blood thinners (anticoagulants) are helpful in reducing the risk of forming abnormal blood clots that can lead to stroke. If you have the irregular heart rhythm of atrial fibrillation, you should be on a blood thinner unless there is a good reason you cannot take them.  Understand all your medicine instructions. SEEK IMMEDIATE MEDICAL CARE IF:   You have sudden weakness or numbness of the face, arm, or leg, especially on one side of the body.  You have sudden confusion.  You have trouble speaking (aphasia) or understanding.  You have sudden trouble seeing in one or both eyes.  You have sudden trouble walking.  You have dizziness.  You have a loss of balance or coordination.  You have a sudden, severe headache with no known cause.  You have new chest pain or an irregular heartbeat. Any of these symptoms may represent a serious problem that is an emergency. Do not wait to see if the symptoms will go away. Get medical help right away. Call your local emergency services (911 in [place] Do not drive yourself to the hospital. Document Released: 03/14/2004 Document Revised: 04/29/2011 Document Reviewed: 08/07/2012 [facility] Patient Information 2014 [place].   Peripheral Vascular Disease Peripheral Vascular Disease (PVD), also called Peripheral Arterial Disease (PAD), is a circulation problem caused by cholesterol (atherosclerotic plaque) deposits in the arteries. PVD commonly occurs in the lower extremities (legs) but it can occur in other areas of the body, such as your arms. The cholesterol buildup in the arteries reduces blood flow which can cause pain and other serious problems. The presence  of PVD can place a person at risk for Coronary Artery Disease (CAD).  CAUSES  Causes of PVD can be many. It is usually associated with more than one risk factor such  as:   High Cholesterol.  Smoking.  Diabetes.  Lack of exercise or inactivity.  High blood pressure (hypertension).  Obesity.  Family history. SYMPTOMS   When the lower extremities are affected, patients with PVD may experience:  Leg pain with exertion or physical activity. This is called INTERMITTENT CLAUDICATION. This may present as cramping or numbness with physical activity. The location of the pain is associated with the level of blockage. For example, blockage at the abdominal level (distal abdominal aorta) may result in buttock or hip pain. Lower leg arterial blockage may result in calf pain.  As PVD becomes more severe, pain can develop with less physical activity.  In people with severe PVD, leg pain may occur at rest.  Other PVD signs and symptoms:  Leg numbness or weakness.  Coldness in the affected leg or foot, especially when compared to the other leg.  A change in leg color.  Patients with significant PVD are more prone to ulcers or sores on toes, feet or legs. These may take longer to heal or may reoccur. The ulcers or sores can become infected.  If signs and symptoms of PVD are ignored, gangrene may occur. This can result in the loss of toes or loss of an entire limb.  Not all leg pain is related to PVD. Other medical conditions can cause leg pain such as:  Blood clots (embolism) or Deep Vein Thrombosis.  Inflammation of the blood vessels (vasculitis).  Spinal stenosis. DIAGNOSIS  Diagnosis of PVD can involve several different types of tests. These can include:  Pulse Volume Recording Method (PVR). This test is simple, painless and does not involve the use of X-rays. PVR involves measuring and comparing the blood pressure in the arms and legs. An ABI (Ankle-Brachial Index) is calculated. The normal ratio of blood pressures is 1. As this number becomes smaller, it indicates more severe disease.  < 0.95  indicates significant narrowing in one or more leg  vessels.  <0.8 there will usually be pain in the foot, leg or buttock with exercise.  <0.4 will usually have pain in the legs at rest.  <0.25  usually indicates limb threatening PVD.  Doppler detection of pulses in the legs. This test is painless and checks to see if you have a pulses in your legs/feet.  A dye or contrast material (a substance that highlights the blood vessels so they show up on x-ray) may be given to help your caregiver better see the arteries for the following tests. The dye is eliminated from your body by the kidney's. Your caregiver may order blood work to check your kidney function and other laboratory values before the following tests are performed:  Magnetic Resonance Angiography (MRA). An MRA is a picture study of the blood vessels and arteries. The MRA machine uses a large magnet to produce images of the blood vessels.  Computed Tomography Angiography (CTA). A CTA is a specialized x-ray that looks at how the blood flows in your blood vessels. An IV may be inserted into your arm so contrast dye can be injected.  Angiogram. Is a procedure that uses x-rays to look at your blood vessels. This procedure is minimally invasive, meaning a small incision (cut) is made in your groin. A small tube (catheter) is then inserted into the artery  of your groin. The catheter is guided to the blood vessel or artery your caregiver wants to examine. Contrast dye is injected into the catheter. X-rays are then taken of the blood vessel or artery. After the images are obtained, the catheter is taken out. TREATMENT  Treatment of PVD involves many interventions which may include:  Lifestyle changes:  Quitting smoking.  Exercise.  Following a low fat, low cholesterol diet.  Control of diabetes.  Foot care is very important to the PVD patient. Good foot care can help prevent infection.  Medication:  Cholesterol-lowering medicine.  Blood pressure medicine.  Anti-platelet  drugs.  Certain medicines may reduce symptoms of Intermittent Claudication.  Interventional/Surgical options:  Angioplasty. An Angioplasty is a procedure that inflates a balloon in the blocked artery. This opens the blocked artery to improve blood flow.  Stent Implant. A wire mesh tube (stent) is placed in the artery. The stent expands and stays in place, allowing the artery to remain open.  Peripheral Bypass Surgery. This is a surgical procedure that reroutes the blood around a blocked artery to help improve blood flow. This type of procedure may be performed if Angioplasty or stent implants are not an option. SEEK IMMEDIATE MEDICAL CARE IF:   You develop pain or numbness in your arms or legs.  Your arm or leg turns cold, becomes blue in color.  You develop redness, warmth, swelling and pain in your arms or legs. MAKE SURE YOU:   Understand these instructions.  Will watch your condition.  Will get help right away if you are not doing well or get worse. Document Released: 03/14/2004 Document Revised: 04/29/2011 Document Reviewed: 02/09/2008 [facility] Patient Information 2014 [place], [place].

## 2013-02-23 ENCOUNTER — Other Ambulatory Visit: Payer: Self-pay | Admitting: Physician Assistant

## 2013-03-05 ENCOUNTER — Other Ambulatory Visit: Payer: Self-pay | Admitting: Emergency Medicine

## 2013-03-07 ENCOUNTER — Other Ambulatory Visit: Payer: Self-pay | Admitting: Cardiology

## 2013-03-08 ENCOUNTER — Other Ambulatory Visit: Payer: Self-pay | Admitting: Physician Assistant

## 2013-04-08 ENCOUNTER — Other Ambulatory Visit: Payer: Self-pay | Admitting: Emergency Medicine

## 2013-04-08 NOTE — Telephone Encounter (Signed)
Dr Everlene Farrier, pt has appt sch for 06/01/13. Can we RF this until then?

## 2013-04-18 DIAGNOSIS — I209 Angina pectoris, unspecified: Secondary | ICD-10-CM

## 2013-04-18 HISTORY — DX: Angina pectoris, unspecified: I20.9

## 2013-04-20 ENCOUNTER — Ambulatory Visit (INDEPENDENT_AMBULATORY_CARE_PROVIDER_SITE_OTHER): Payer: PRIVATE HEALTH INSURANCE | Admitting: Cardiology

## 2013-04-20 ENCOUNTER — Encounter: Payer: Self-pay | Admitting: Cardiology

## 2013-04-20 VITALS — BP 150/80 | HR 59 | Ht 73.0 in | Wt 314.9 lb

## 2013-04-20 DIAGNOSIS — R5381 Other malaise: Secondary | ICD-10-CM

## 2013-04-20 DIAGNOSIS — I2510 Atherosclerotic heart disease of native coronary artery without angina pectoris: Secondary | ICD-10-CM

## 2013-04-20 DIAGNOSIS — I272 Pulmonary hypertension, unspecified: Secondary | ICD-10-CM

## 2013-04-20 DIAGNOSIS — E785 Hyperlipidemia, unspecified: Secondary | ICD-10-CM

## 2013-04-20 DIAGNOSIS — G4730 Sleep apnea, unspecified: Secondary | ICD-10-CM

## 2013-04-20 DIAGNOSIS — I10 Essential (primary) hypertension: Secondary | ICD-10-CM

## 2013-04-20 DIAGNOSIS — IMO0001 Reserved for inherently not codable concepts without codable children: Secondary | ICD-10-CM | POA: Insufficient documentation

## 2013-04-20 DIAGNOSIS — R5383 Other fatigue: Secondary | ICD-10-CM

## 2013-04-20 DIAGNOSIS — I2789 Other specified pulmonary heart diseases: Secondary | ICD-10-CM

## 2013-04-20 NOTE — Patient Instructions (Addendum)
Continue to HOLD SIMVASTATIN and FENOFIBRATE.  Follow-up with Estelle Grumbles.  Your physician wants you to follow-up in: 2 months with Dr Hershal Coria min appointment. You will receive a reminder letter in the mail two months in advance. If you don't receive a letter, please call our office to schedule the follow-up appointment.

## 2013-04-20 NOTE — Progress Notes (Signed)
PCP: Harold Reichmann, MD  Clinic Note: Chief Complaint  Patient presents with  . ROV 6 months    C/o occas angina, shortness of breath, weight gain, severe muscle fatigue, and swelling of the right lower leg, ankle, and foot. Dr Harold [name] suggested he stop Simvastatin and Fenofibrate for 4 weeks-stopped on Friday, 2/27.    HPI: Harold [name] is a 58 y.o. male with a Cardiovascular Problem List below who presents today for six-month followup of his CAD and other cardiac risk factors. He has a history of multivessel PCI about a year ago..  Interval History: Harold [name] unfortunately comes in today a little bit out of sorts. He seems to be somewhat depressed stating that he has been having pretty significant myalgias of late they're really kept him from being out of be as active as he used to be. He has been upset and is therefore gone back to eating inappropriately with comfort food. He is put on about 30 pounds since his last visit which is only 15 pounds lighter than on his previous max was. With that he has noted recently worsening dyspnea on exertion but it seems to be more related to muscle aches and pains that make him feel as though his legs will give out as opposed to simple the dyspnea. He is not having any of the chest tightness and pressure that he had that was consistent with angina. 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 He denies any PND or orthopnea, but has had some mild edema of. He is still using his CPAP at night. He is not having nearly the same neuropathic pain he was having before. He is also noting some right lower quadrant and umbilical area pain that he thinks may be related to how his sitting at his desk at work or at the desk pushing against his abdomen. It has gotten worse since his one week. Next. She is also very concerned his mother so ago he has A1c checked and it  was up to 14. After more aggressive therapy is back on 9.7 but this is like to worsening weight gain. He has recently stopped the combination of fenofibrate and simvastatin at the end of February by them elevations of his primary physician in hopes that this could be the causative etiology for his myalgias and myositis. Apparently his CK levels have been elevated.  Cardiovascular ROS: positive for - dyspnea on exertion and edema negative for - chest pain, irregular heartbeat, loss of consciousness, murmur, orthopnea, palpitations, paroxysmal nocturnal dyspnea, rapid heart rate or shortness of breath: Additional cardiac review of systems: Lightheadedness - no, dizziness - no, syncope/near-syncope - no; TIA/amaurosis fugax - no Melena - no, hematochezia no; hematuria - no; nosebleeds - no; claudication - no  Past Medical History  Diagnosis Date  . Diabetes mellitus   . Carotid artery occlusion   . Complication of anesthesia   . PONV (postoperative nausea and vomiting)     "Patch Works"  . Hypothyroidism   . Depression     situaltional   . Sleep apnea   . Neuropathy     notably improved following PCI with improved cardiac function  . CAD, multiple vessel 2003, 04/2012    status post PCI to LAD, circumflex-OM 2, RCA  . Obesity, Class II, BMI 35.0-39.9, with comorbidity (see actual BMI)  BMI 39; wgt loss efforts in place; seeing Dietician  . Dyslipidemia associated with type 2 diabetes mellitus   . GERD (gastroesophageal reflux disease)   . Chronic renal insufficiency, stage II (mild)   . Pulmonary hypertension 04/2012    PA pressure 69mhg  . HTN (hypertension)   . Chronic venous insufficiency     varicosities, no reflux; dopplers 04/14/12- valvular insufficiency in the R and L GSV  . CAD S/P percutaneous coronary angioplasty     see cath and PCI and post surgical history    Prior Cardiac Evaluation and Past Surgical History: Past Surgical History  Procedure Laterality Date  .  Umbilical hernia repair  2009    steel mesh insert  . Coronary angioplasty  12/21/2001    PTCA of the distal and mid AV groove circ, unsuccessful PTCA of second OM total occlusion, unsuccessful PTCA of the apical LAD total occlusion  . Colonscopy    . Carotid endarterectomy  2005    Right; recent carotid Dopplers notes elevated velocities.   . Coronary stent placement  04/28/12    PCI to LAD (3x271mXience DES postdilated to 3.25) and circ prox and mid (2 overlappinmg 2.33m4m2mmXience DES postdilated to 2.733m47m. Cardiac catheterization  12/11/2001    significant 3V CAD, normal LV function  . Coronary angioplasty with stent placement  04/29/12    PCI to 3 RCA lesions, Promus Premiere 2.233mm233m distally, mid was 2.33mx28433mand proximally 2.75x16mm, 1m5-60%  . Doppler echocardiography  04/28/2012    poor quality study: EF estimated 60-65%; unable to assess diastolic function (previously noted to have diastolic dysfunction); severely dilated left atrium and mild right atrium; dilated IVC consistent with increased central venous pressure..  . Carotid doppler  04/27/2012    right internal carotid: Elevated velocities but no evidence of plaque. Left internal carotid 40-59%  . Abis  04/27/2012    mild bilateral arterial insufficiency    MEDICATIONS AND ALLERGIES REVIEWED IN EPIC No Change in Family History --  Harold [name] anKairavs long-term partner, Harold Iona Beardng to get married on their 20th anniversary which is April 8. He has been back to work now working full-time is happily busy at work which makes it harder for him potentially need to miss work due to his fatigue.  ROS: A comprehensive Review of Systems - Negative except Pertinent symptoms noted above, most notably the myalgias, fatigue and associated depression.  PHYSICAL EXAM BP 150/80  Pulse 59  Ht _0  (1.854 m)  Wt 314 lb 14.4 oz (142.838 kg)  BMI 41.56 kg/m2 General appearance: alert, cooperative, appears stated age, no distress and  borderline morbidly obese -- 30 pound weight gain since last visit Neck: no adenopathy, no carotid bruit and no JVD Lungs: clear to auscultation bilaterally, normal percussion bilaterally and non-labored Heart: regular rate and rhythm, S1, S2 normal, no murmur, click, rub or gallop; nondisplaced PMI. Occasional ectopic beats. Abdomen: soft, non-tender; bowel sounds normal; no masses,  no organomegaly; rotund, obese abdomen. There is tenderness to the right lower quadrant as well as the umbilical region. Extremities: extremities normal, atraumatic, no cyanosis, and trace to 1+ lower extremity pitting edema  Pulses: 2+ and symmetric; Neurologic: Mental status: Alert, oriented, thought content appropriate Cranial nerves: normal (II-XII grossly intact)  EKG:PerOVZ:CHYIFOYDX Yes Rate: 59 , Rhythm: Sinus bradycardia, otherwise normal EKG  Recent Labs 04/01/2011:   Hemoglobin A1c back down to 9.7, glucose 163, BUN/creatinine 20/1.2. Otherwise normal.  Home blood pressure recordings range from  130/70-154/70. Weight in November was as low as 272 pounds, now at home up to 310 pounds. Weight gain began in mid December. He notes that this is a combination of dietary indiscretion over the holidays along with his now inability to be active and exercise.  ASSESSMENT / PLAN: CAD, LAD/CFX DES 04/28/12- staged RCA DES 04/29/12 after pt declined CABG Roughly one year out from his PCI. I think we can switch him from Brilinta to Plavix for cost savings. He is on ACE inhibitor, beta blocker. He is no longer on statin as it was held due to the myalgias. He remains on fish oil.  No active anginal symptoms. I think his current dyspnea on exertion is probably more related to his obesity and myalgias. He is not having the chest pressure that he was having before.  Myalgia and myositis I was concerned about accommodation Fenfibrate plus simvastatin. He been on different statin but we'll switch back to Zocor do to his  insurance. I have recommended he stay off both of them for at least a month if not 2 months. We can reassess at that time, and I will probably consider using Crestor or Livalo for minimal myalgia side effects. I want him to get fully back active and exercising before trying anything.  Hopefully, as he is able to get back into more activity and will get back in control of his diet, we will see a positive effect on his lipids.  Morbid obesity Now that his BMI is back about 40, his back and will be to be a category. He is scheduled to go back to see Estelle Grumbles, his nutritionist. He knows he needs to get back into the routine of his adjusted diet. He also hopefully will be a get back into exercising like he had been doing once the myalgias affect from the statins wear off.  Hyperlipidemia Now has fenofibrate and statin on hold. He had high triglycerides in the past. We'll probably need to consider something like a Zetia in addition to Crestor in the future. For now statin and fibrate are on hold until his myositis/myalgia resolves.  Fatigue Probably related to the myalgias. He is also starting to have some more depression fracture. I gave him good pep talk.  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 Continue CPAP and weight loss.  Hypertension His blood pressure is actually a little bit up today, but his home recordings showed the relatively well-controlled. We backed off on his lisinopril to 10 mg last visit, we may need to go back to 20. I'll readdress this when I see him back in followup.   He has asked that I dictate a letter for work reference his concerns about how his desk hits his abdomen. I dictated this letter we have also added a comment about his potential for missing work days due to fatigue from his medication-related myalgia/myositis. We may need to have FMLA time adjusted for.  Orders Placed This Encounter  Procedures  . EKG 12-Lead    No orders of the defined types were placed in this encounter.    Followup: 2 months  Donjuan Robison W. HEllyn Hack M.D., M.S. Interventional Cardiologist [facility]

## 2013-04-21 ENCOUNTER — Telehealth: Payer: Self-pay | Admitting: *Deleted

## 2013-04-21 NOTE — Assessment & Plan Note (Signed)
Now that his BMI is back about 40, his back and will be to be a category. He is scheduled to go back to see Estelle Grumbles, his nutritionist. He knows he needs to get back into the routine of his adjusted diet. He also hopefully will be a get back into exercising like he had been doing once the myalgias affect from the statins wear off.

## 2013-04-21 NOTE — Assessment & Plan Note (Signed)
Now has fenofibrate and statin on hold. He had high triglycerides in the past. We'll probably need to consider something like a Zetia in addition to Crestor in the future. For now statin and fibrate are on hold until his myositis/myalgia resolves.

## 2013-04-21 NOTE — Assessment & Plan Note (Signed)
His blood pressure is actually a little bit up today, but his home recordings showed the relatively well-controlled. We backed off on his lisinopril to 10 mg last visit, we may need to go back to 20. I'll readdress this when I see him back in followup.

## 2013-04-21 NOTE — Telephone Encounter (Signed)
Left message on voicemail - letter and FMLA form is ready to be picked up

## 2013-04-21 NOTE — Assessment & Plan Note (Signed)
Roughly one year out from his PCI. I think we can switch him from Brilinta to Plavix for cost savings. He is on ACE inhibitor, beta blocker. He is no longer on statin as it was held due to the myalgias. He remains on fish oil.  No active anginal symptoms. I think his current dyspnea on exertion is probably more related to his obesity and myalgias. He is not having the chest pressure that he was having before.

## 2013-04-21 NOTE — Assessment & Plan Note (Signed)
Probably related to the myalgias. He is also starting to have some more depression fracture. I gave him good pep talk.

## 2013-04-21 NOTE — Assessment & Plan Note (Signed)
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 Continue CPAP and weight loss.

## 2013-04-21 NOTE — Assessment & Plan Note (Signed)
I was concerned about accommodation Fenfibrate plus simvastatin. He been on different statin but we'll switch back to Zocor do to his insurance. I have recommended he stay off both of them for at least a month if not 2 months. We can reassess at that time, and I will probably consider using Crestor or Livalo for minimal myalgia side effects. I want him to get fully back active and exercising before trying anything.  Hopefully, as he is able to get back into more activity and will get back in control of his diet, we will see a positive effect on his lipids.

## 2013-04-27 ENCOUNTER — Telehealth: Payer: Self-pay | Admitting: Cardiology

## 2013-04-27 NOTE — Telephone Encounter (Signed)
Will address this on Thursday when I have completed AM patients.  Leonie Man, MD

## 2013-04-27 NOTE — Telephone Encounter (Signed)
Please call-His FMLA and letter need to have changes made on them. He wants to know when can he come in and get this changes?Pt said he wa was suppose to be getting a prescription for his Plavix,still have not received it.

## 2013-04-27 NOTE — Telephone Encounter (Signed)
Returned call and pt verified x 2.  Pt informed message received and will be sent to Dr. Sharlyn Bologna, RN as they are both out of the office today.  Pt stated one sentence is partial and doesn't make sense and his HR dept has recommended another statement be reworded.  RN asked pt to write down the changes needed and leave paperwork at front desk for [place].  Pt also informed Dr. Ellyn Hack will advise if script for Plavix to be sent or not.  Pt verbalized understanding and agreed w/ plan.  Pt uses [facility].  Message forwarded to Dr. Sharlyn Bologna, RN.

## 2013-05-03 ENCOUNTER — Encounter: Payer: Self-pay | Admitting: Cardiology

## 2013-05-03 NOTE — Telephone Encounter (Signed)
Patient brought information on 04/30/13 for Dr Ellyn Hack to review  Will be given to Dr Ellyn Hack on 05/04/13

## 2013-05-05 NOTE — Telephone Encounter (Signed)
Spoke to patient. Informed patient that  Form is ready to be picked up . Patient states he will pick up tomorrow 05/06/13

## 2013-05-07 ENCOUNTER — Other Ambulatory Visit: Payer: Self-pay | Admitting: Physician Assistant

## 2013-05-07 ENCOUNTER — Other Ambulatory Visit (HOSPITAL_COMMUNITY): Payer: Self-pay | Admitting: Cardiology

## 2013-05-07 NOTE — Telephone Encounter (Signed)
Rx was sent to pharmacy electronically.

## 2013-05-09 NOTE — Telephone Encounter (Signed)
Needs OV

## 2013-05-12 ENCOUNTER — Telehealth: Payer: Self-pay | Admitting: Cardiology

## 2013-05-12 NOTE — Telephone Encounter (Signed)
I would expect his symptoms to start to improve.   If CK levels are still elevated, this may be a non-statin mediated symptom.  I will discuss with Erasmo Downer to see if she is aware of "treatment" options for Stating induced myositis -- the only think I see is Vitamin D.    Usually, the treatment is simply removing the drug.   My recommendation is that we consider referring him to a Rheumatologist who can look into other types of Myositis.  Besides face-to face time, I am not sure what I or an extender can offer.    Will flag this note to [facility] for input.  Leonie Man, MD

## 2013-05-12 NOTE — Telephone Encounter (Signed)
Returned call and pt verified x 2.    Patient Complaints:  Weak  Muscle pain  SOB w/ walking  Difficulty walking from one room to the other and while talking on phone to RN  Talked to DM doctor and told his high enzyme levels were high  Wants to see Dr. Ellyn Hack  Denied change in symptoms, even w/ stopping simvastatin and fenofibrate.  No appts available w/ Dr. Ellyn Hack soon.  Pt informed and offered to see Extender on Friday.  Pt stated he prefers to see Dr. Ellyn Hack.  Informed RN will send him a message and will inform him of response once given.  Pt advised to go to [facility] ER for any worsening symptoms.  Pt verbalized understanding and agreed w/ plan.  Message forwarded to Dr. Ellyn Hack.

## 2013-05-12 NOTE — Telephone Encounter (Signed)
Still having a lot of problems since 04/20/13 from walking and breathing .Marland Kitchen Can't go from one room to the other without being shortness of breath. A little worried and scared about this .Marland Kitchen Please Call   Thanks

## 2013-05-13 ENCOUNTER — Inpatient Hospital Stay (HOSPITAL_COMMUNITY)
Admission: EM | Admit: 2013-05-13 | Discharge: 2013-05-15 | DRG: 287 | Disposition: A | Discharge status: Home / Self Care | Payer: No Typology Code available for payment source | Attending: Cardiology | Admitting: Cardiology

## 2013-05-13 ENCOUNTER — Encounter (HOSPITAL_COMMUNITY): Payer: Self-pay | Admitting: Emergency Medicine

## 2013-05-13 ENCOUNTER — Emergency Department (HOSPITAL_COMMUNITY): Payer: No Typology Code available for payment source

## 2013-05-13 DIAGNOSIS — E1169 Type 2 diabetes mellitus with other specified complication: Secondary | ICD-10-CM | POA: Diagnosis present

## 2013-05-13 DIAGNOSIS — R0609 Other forms of dyspnea: Secondary | ICD-10-CM | POA: Diagnosis present

## 2013-05-13 DIAGNOSIS — Z79899 Other long term (current) drug therapy: Secondary | ICD-10-CM

## 2013-05-13 DIAGNOSIS — I872 Venous insufficiency (chronic) (peripheral): Secondary | ICD-10-CM | POA: Diagnosis present

## 2013-05-13 DIAGNOSIS — G4733 Obstructive sleep apnea (adult) (pediatric): Secondary | ICD-10-CM | POA: Diagnosis present

## 2013-05-13 DIAGNOSIS — Z6841 Body Mass Index (BMI) 40.0 and over, adult: Secondary | ICD-10-CM

## 2013-05-13 DIAGNOSIS — R0989 Other specified symptoms and signs involving the circulatory and respiratory systems: Secondary | ICD-10-CM

## 2013-05-13 DIAGNOSIS — I2789 Other specified pulmonary heart diseases: Secondary | ICD-10-CM | POA: Diagnosis present

## 2013-05-13 DIAGNOSIS — E66813 Obesity, class 3: Secondary | ICD-10-CM | POA: Diagnosis present

## 2013-05-13 DIAGNOSIS — I2510 Atherosclerotic heart disease of native coronary artery without angina pectoris: Principal | ICD-10-CM | POA: Diagnosis present

## 2013-05-13 DIAGNOSIS — I129 Hypertensive chronic kidney disease with stage 1 through stage 4 chronic kidney disease, or unspecified chronic kidney disease: Secondary | ICD-10-CM | POA: Diagnosis present

## 2013-05-13 DIAGNOSIS — I10 Essential (primary) hypertension: Secondary | ICD-10-CM | POA: Diagnosis present

## 2013-05-13 DIAGNOSIS — IMO0001 Reserved for inherently not codable concepts without codable children: Secondary | ICD-10-CM

## 2013-05-13 DIAGNOSIS — Z794 Long term (current) use of insulin: Secondary | ICD-10-CM | POA: Diagnosis not present

## 2013-05-13 DIAGNOSIS — E785 Hyperlipidemia, unspecified: Secondary | ICD-10-CM | POA: Diagnosis present

## 2013-05-13 DIAGNOSIS — E039 Hypothyroidism, unspecified: Secondary | ICD-10-CM | POA: Diagnosis present

## 2013-05-13 DIAGNOSIS — K219 Gastro-esophageal reflux disease without esophagitis: Secondary | ICD-10-CM | POA: Diagnosis present

## 2013-05-13 DIAGNOSIS — E669 Obesity, unspecified: Secondary | ICD-10-CM | POA: Diagnosis present

## 2013-05-13 DIAGNOSIS — Z9989 Dependence on other enabling machines and devices: Secondary | ICD-10-CM

## 2013-05-13 DIAGNOSIS — E1142 Type 2 diabetes mellitus with diabetic polyneuropathy: Secondary | ICD-10-CM | POA: Diagnosis present

## 2013-05-13 DIAGNOSIS — E1149 Type 2 diabetes mellitus with other diabetic neurological complication: Secondary | ICD-10-CM | POA: Diagnosis present

## 2013-05-13 DIAGNOSIS — Z7982 Long term (current) use of aspirin: Secondary | ICD-10-CM | POA: Diagnosis not present

## 2013-05-13 DIAGNOSIS — I200 Unstable angina: Secondary | ICD-10-CM | POA: Diagnosis present

## 2013-05-13 DIAGNOSIS — N182 Chronic kidney disease, stage 2 (mild): Secondary | ICD-10-CM | POA: Diagnosis present

## 2013-05-13 DIAGNOSIS — G4730 Sleep apnea, unspecified: Secondary | ICD-10-CM

## 2013-05-13 DIAGNOSIS — Z9861 Coronary angioplasty status: Secondary | ICD-10-CM

## 2013-05-13 DIAGNOSIS — E782 Mixed hyperlipidemia: Secondary | ICD-10-CM

## 2013-05-13 DIAGNOSIS — E119 Type 2 diabetes mellitus without complications: Secondary | ICD-10-CM

## 2013-05-13 DIAGNOSIS — I272 Pulmonary hypertension, unspecified: Secondary | ICD-10-CM | POA: Diagnosis present

## 2013-05-13 DIAGNOSIS — R079 Chest pain, unspecified: Secondary | ICD-10-CM | POA: Diagnosis present

## 2013-05-13 HISTORY — DX: Angina pectoris, unspecified: I20.9

## 2013-05-13 LAB — CBC WITH DIFFERENTIAL/PLATELET
HCT: 38.8 % — ABNORMAL LOW (ref 39.0–52.0)
HEMOGLOBIN: 14.1 g/dL (ref 13.0–17.0)
MCH: 31.5 pg (ref 26.0–34.0)
MCHC: 36.3 g/dL — AB (ref 30.0–36.0)
MCV: 86.6 fL (ref 78.0–100.0)
Platelets: 187 10*3/uL (ref 150–400)
RBC: 4.48 MIL/uL (ref 4.22–5.81)
RDW: 13.7 % (ref 11.5–15.5)
WBC: 6.1 10*3/uL (ref 4.0–10.5)

## 2013-05-13 LAB — BASIC METABOLIC PANEL
BUN: 18 mg/dL (ref 6–23)
CO2: 18 meq/L — AB (ref 19–32)
Calcium: 9.9 mg/dL (ref 8.4–10.5)
Chloride: 99 mEq/L (ref 96–112)
Creatinine, Ser: 1.08 mg/dL (ref 0.50–1.35)
GFR calc Af Amer: 85 mL/min — ABNORMAL LOW (ref >90)
GFR calc non Af Amer: 73 mL/min — ABNORMAL LOW (ref >90)
Glucose, Bld: 75 mg/dL (ref 70–99)
Potassium: 4.7 mEq/L (ref 3.7–5.3)
Sodium: 139 mEq/L (ref 137–147)

## 2013-05-13 LAB — CK: Total CK: 459 U/L — ABNORMAL HIGH (ref 7–232)

## 2013-05-13 LAB — I-STAT TROPONIN, ED: Troponin i, poc: 0.01 ng/mL (ref 0.00–0.08)

## 2013-05-13 LAB — CK TOTAL AND CKMB (NOT AT ARMC)
CK TOTAL: 380 U/L — AB (ref 7–232)
CK, MB: 6.7 ng/mL (ref 0.3–4.0)
Relative Index: 1.8 (ref 0.0–2.5)

## 2013-05-13 LAB — PRO B NATRIURETIC PEPTIDE: Pro B Natriuretic peptide (BNP): 98.6 pg/mL (ref 0–125)

## 2013-05-13 MED ORDER — SODIUM CHLORIDE 0.9 % IV BOLUS (SEPSIS)
1000.0000 mL | Freq: Once | INTRAVENOUS | Status: AC
  Administered 2013-05-13: 1000 mL via INTRAVENOUS

## 2013-05-13 MED ORDER — INSULIN NPH (HUMAN) (ISOPHANE) 100 UNIT/ML ~~LOC~~ SUSP
75.0000 [IU] | Freq: Two times a day (BID) | SUBCUTANEOUS | Status: DC
  Administered 2013-05-14 – 2013-05-15 (×2): 75 [IU] via SUBCUTANEOUS
  Filled 2013-05-13: qty 10

## 2013-05-13 MED ORDER — INSULIN ASPART 100 UNIT/ML ~~LOC~~ SOLN
0.0000 [IU] | Freq: Three times a day (TID) | SUBCUTANEOUS | Status: DC
Start: 2013-05-14 — End: 2013-05-15
  Administered 2013-05-14: 5 [IU] via SUBCUTANEOUS
  Administered 2013-05-15: 2 [IU] via SUBCUTANEOUS

## 2013-05-13 MED ORDER — TICAGRELOR 90 MG PO TABS
90.0000 mg | ORAL_TABLET | Freq: Two times a day (BID) | ORAL | Status: DC
  Administered 2013-05-13 – 2013-05-15 (×4): 90 mg via ORAL
  Filled 2013-05-13 (×5): qty 1

## 2013-05-13 MED ORDER — SODIUM CHLORIDE 0.9 % IJ SOLN: 3.0000 mL | INTRAMUSCULAR | Status: DC | PRN

## 2013-05-13 MED ORDER — LEVOTHYROXINE SODIUM 75 MCG PO TABS
75.0000 ug | ORAL_TABLET | Freq: Every day | ORAL | Status: DC
  Administered 2013-05-14 – 2013-05-15 (×2): 75 ug via ORAL
  Filled 2013-05-13 (×3): qty 1

## 2013-05-13 MED ORDER — ASPIRIN 81 MG PO CHEW
324.0000 mg | CHEWABLE_TABLET | Freq: Once | ORAL | Status: AC
  Administered 2013-05-13: 324 mg via ORAL
  Filled 2013-05-13: qty 4

## 2013-05-13 MED ORDER — SODIUM CHLORIDE 0.9 % IJ SOLN
3.0000 mL | Freq: Two times a day (BID) | INTRAMUSCULAR | Status: DC
Start: 2013-05-13 — End: 2013-05-15
  Administered 2013-05-15: 3 mL via INTRAVENOUS

## 2013-05-13 MED ORDER — SODIUM CHLORIDE 0.9 % IV SOLN: 250.0000 mL | INTRAVENOUS | Status: DC | PRN

## 2013-05-13 MED ORDER — POTASSIUM 99 MG PO TABS: 99.0000 mg | ORAL_TABLET | Freq: Every day | ORAL | Status: DC

## 2013-05-13 MED ORDER — MORPHINE SULFATE 4 MG/ML IJ SOLN: 6.0000 mg | Freq: Once | INTRAMUSCULAR | Status: DC

## 2013-05-13 MED ORDER — SODIUM CHLORIDE 0.9 % IV SOLN
1.0000 mL/kg/h | INTRAVENOUS | Status: DC
  Administered 2013-05-14 (×2): 1 mL/kg/h via INTRAVENOUS

## 2013-05-13 MED ORDER — ADULT MULTIVITAMIN W/MINERALS CH
1.0000 | ORAL_TABLET | Freq: Every day | ORAL | Status: DC
  Administered 2013-05-15: 1 via ORAL
  Filled 2013-05-13 (×2): qty 1

## 2013-05-13 MED ORDER — LISINOPRIL 10 MG PO TABS
10.0000 mg | ORAL_TABLET | Freq: Every day | ORAL | Status: DC
  Administered 2013-05-14 – 2013-05-15 (×2): 10 mg via ORAL
  Filled 2013-05-13 (×2): qty 1

## 2013-05-13 MED ORDER — ASPIRIN EC 81 MG PO TBEC
81.0000 mg | DELAYED_RELEASE_TABLET | Freq: Every day | ORAL | Status: DC
  Administered 2013-05-15: 81 mg via ORAL
  Filled 2013-05-13 (×3): qty 1

## 2013-05-13 MED ORDER — ATENOLOL 12.5 MG HALF TABLET
12.5000 mg | ORAL_TABLET | Freq: Every evening | ORAL | Status: DC
  Administered 2013-05-14: 12.5 mg via ORAL
  Filled 2013-05-13 (×2): qty 1

## 2013-05-13 MED ORDER — ESCITALOPRAM OXALATE 20 MG PO TABS
20.0000 mg | ORAL_TABLET | Freq: Every day | ORAL | Status: DC
  Administered 2013-05-14 – 2013-05-15 (×2): 20 mg via ORAL
  Filled 2013-05-13 (×2): qty 1

## 2013-05-13 MED ORDER — INSULIN ASPART 100 UNIT/ML ~~LOC~~ SOLN: 50.0000 [IU] | Freq: Two times a day (BID) | SUBCUTANEOUS | Status: DC

## 2013-05-13 MED ORDER — FELODIPINE ER 5 MG PO TB24
5.0000 mg | ORAL_TABLET | Freq: Every day | ORAL | Status: DC
  Administered 2013-05-14 – 2013-05-15 (×2): 5 mg via ORAL
  Filled 2013-05-13 (×2): qty 1

## 2013-05-13 MED ORDER — DIAZEPAM 5 MG PO TABS
5.0000 mg | ORAL_TABLET | ORAL | Status: AC
  Administered 2013-05-14: 5 mg via ORAL
  Filled 2013-05-13: qty 1

## 2013-05-13 MED ORDER — OMEGA-3 FATTY ACIDS 1000 MG PO CAPS: 1.0000 g | ORAL_CAPSULE | Freq: Every day | ORAL | Status: DC

## 2013-05-13 MED ORDER — ASPIRIN 81 MG PO CHEW
324.0000 mg | CHEWABLE_TABLET | ORAL | Status: AC
Start: 2013-05-14 — End: 2013-05-14
  Administered 2013-05-14: 324 mg via ORAL
  Filled 2013-05-13: qty 4

## 2013-05-13 MED ORDER — PANTOPRAZOLE SODIUM 40 MG PO TBEC
80.0000 mg | DELAYED_RELEASE_TABLET | Freq: Every day | ORAL | Status: DC
  Administered 2013-05-14 – 2013-05-15 (×2): 80 mg via ORAL
  Filled 2013-05-13 (×2): qty 2

## 2013-05-13 MED ORDER — NITROGLYCERIN 0.4 MG SL SUBL: 0.4000 mg | SUBLINGUAL_TABLET | SUBLINGUAL | Status: DC | PRN

## 2013-05-13 MED ORDER — HEPARIN (PORCINE) IN NACL 100-0.45 UNIT/ML-% IJ SOLN
1800.0000 [IU]/h | INTRAMUSCULAR | Status: DC
Start: 2013-05-13 — End: 2013-05-14
  Administered 2013-05-13: 1300 [IU]/h via INTRAVENOUS
  Administered 2013-05-14 (×2): 1800 [IU]/h via INTRAVENOUS
  Filled 2013-05-13 (×3): qty 250

## 2013-05-13 MED ORDER — ZOLPIDEM TARTRATE 5 MG PO TABS: 5.0000 mg | ORAL_TABLET | Freq: Every evening | ORAL | Status: DC | PRN

## 2013-05-13 MED ORDER — SODIUM CHLORIDE 0.9 % IJ SOLN: 3.0000 mL | Freq: Two times a day (BID) | INTRAMUSCULAR | Status: DC

## 2013-05-13 MED ORDER — NITROGLYCERIN 0.4 MG SL SUBL
0.4000 mg | SUBLINGUAL_TABLET | SUBLINGUAL | Status: DC | PRN
Start: 2013-05-13 — End: 2013-05-15

## 2013-05-13 MED ORDER — INSULIN ASPART 100 UNIT/ML ~~LOC~~ SOLN
0.0000 [IU] | Freq: Every day | SUBCUTANEOUS | Status: DC
  Administered 2013-05-14: 2 [IU] via SUBCUTANEOUS

## 2013-05-13 MED ORDER — ALPRAZOLAM 0.25 MG PO TABS: 0.2500 mg | ORAL_TABLET | Freq: Two times a day (BID) | ORAL | Status: DC | PRN

## 2013-05-13 MED ORDER — FUROSEMIDE 40 MG PO TABS
40.0000 mg | ORAL_TABLET | Freq: Every day | ORAL | Status: DC
  Administered 2013-05-15: 40 mg via ORAL
  Filled 2013-05-13 (×2): qty 1

## 2013-05-13 MED ORDER — HEPARIN BOLUS VIA INFUSION
4000.0000 [IU] | Freq: Once | INTRAVENOUS | Status: AC
  Administered 2013-05-13: 4000 [IU] via INTRAVENOUS
  Filled 2013-05-13: qty 4000

## 2013-05-13 MED ORDER — ACETAMINOPHEN 325 MG PO TABS: 650.0000 mg | ORAL_TABLET | ORAL | Status: DC | PRN

## 2013-05-13 MED ORDER — SODIUM CHLORIDE 0.9 % IV SOLN
250.0000 mL | INTRAVENOUS | Status: DC | PRN
Start: 2013-05-13 — End: 2013-05-15

## 2013-05-13 MED ORDER — ONDANSETRON HCL 4 MG/2ML IJ SOLN: 4.0000 mg | Freq: Four times a day (QID) | INTRAMUSCULAR | Status: DC | PRN

## 2013-05-13 MED ORDER — MULTI-VITAMIN/MINERALS PO TABS: 1.0000 | ORAL_TABLET | Freq: Every day | ORAL | Status: DC

## 2013-05-13 MED ORDER — VITAMIN D3 25 MCG (1000 UNIT) PO TABS
1000.0000 [IU] | ORAL_TABLET | Freq: Four times a day (QID) | ORAL | Status: DC
  Administered 2013-05-14 – 2013-05-15 (×3): 1000 [IU] via ORAL
  Filled 2013-05-13 (×9): qty 1

## 2013-05-13 MED ORDER — OMEGA-3-ACID ETHYL ESTERS 1 G PO CAPS
1.0000 g | ORAL_CAPSULE | Freq: Every day | ORAL | Status: DC
  Administered 2013-05-15: 1 g via ORAL
  Filled 2013-05-13 (×2): qty 1

## 2013-05-13 NOTE — Telephone Encounter (Signed)
Agree with ER visit. Leonie Man, MD

## 2013-05-13 NOTE — Telephone Encounter (Signed)
DH  I would suggest checking a vit d level and TSH.  If those are both fine, then this is probably not statin related and should be referred to the rheumatologist.  Harold [name]

## 2013-05-13 NOTE — ED Notes (Signed)
Pt waiting for bed assignment.  Continues to deny any chest pain or discomfort.  Skin warm and dry color appropriate.

## 2013-05-13 NOTE — Telephone Encounter (Signed)
Returned call.  Pt confirmed he is having weakness, SOB and sweating now.  Advised he needs to be seen in ER now for evaluation.  Pt verbalized understanding and agreed w/ plan.

## 2013-05-13 NOTE — ED Notes (Signed)
Pt sitting on side of stretcher eating [place] sandwich.

## 2013-05-13 NOTE — ED Notes (Signed)
Pt continues to deny any chest pain or discomfort at this time.  Skin warm and dry color appropriate.  Friend at bedside.

## 2013-05-13 NOTE — Telephone Encounter (Signed)
Pt did arrive at ER.  Will defer plan below until f/u.

## 2013-05-13 NOTE — ED Notes (Signed)
Pt resting quietly at this time.  Continues to deny any chest pain or discomfort.

## 2013-05-13 NOTE — ED Notes (Signed)
PT with hx of 6 stent placements to ED c/o sob, especially on exertion, for several weeks.  He initially was seen Dr Ellyn Hack who felt it was a side-effect of his cholesterol medication.  However, pt now complains that sob is increasing with L arm pain, dizziness and diaphoresis as well as swelling to ankles bil.

## 2013-05-13 NOTE — Progress Notes (Signed)
ANTICOAGULATION CONSULT NOTE - Initial Consult  Pharmacy Consult for Heparin  Indication: chest pain/ACS  Allergies  Allergen Reactions  . Septra [Bactrim] Itching  . Penicillins Hives    Allergy to All cillin drugs  . Other     Walnuts, pecans     Patient Measurements: Height: _0  (185.4 cm) Weight: 313 lb (141.976 kg) IBW/kg (Calculated) : 79.9 Heparin Dosing Weight: 112 kg   Vital Signs: Temp: 97.8 F (36.6 C) (03/26 1140) Temp src: Oral (03/26 1140) BP: 126/52 mmHg (03/26 1501) Pulse Rate: 58 (03/26 1501)  Labs:  Recent Labs  05/13/13 1200 05/13/13 1657  HGB 14.1  --   HCT 38.8*  --   PLT 187  --   CREATININE 1.08  --   CKTOTAL 459* 380*  CKMB  --  6.7*    Estimated Creatinine Clearance: 109.1 ml/min (by C-G formula based on Cr of 1.08).   Medical History: Past Medical History  Diagnosis Date  . Diabetes mellitus   . Carotid artery occlusion   . Complication of anesthesia   . PONV (postoperative nausea and vomiting)     "Patch Works"  . Hypothyroidism   . Depression     situaltional   . Sleep apnea   . Neuropathy     notably improved following PCI with improved cardiac function  . CAD, multiple vessel 2003, 04/2012    status post PCI to LAD, circumflex-OM 2, RCA  . 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 Dyslipidemia associated with type 2 diabetes mellitus   . GERD (gastroesophageal reflux disease)   . Chronic renal insufficiency, stage II (mild)   . Pulmonary hypertension 04/2012    PA pressure 56mhg  . HTN (hypertension)   . Chronic venous insufficiency     varicosities, no reflux; dopplers 04/14/12- valvular insufficiency in the R and L GSV  . CAD S/P percutaneous coronary angioplasty     see cath and PCI and post surgical history    Medications:   (Not in a hospital admission)  Assessment: [id number] with SOB and lower extremity edema with  dopplers negative for DVT. Per cardiology, plan is to perform right and left heart catheterization. Pharmacy to start heparin drip. Hgb and Plt wnl.   Goal of Therapy:  Heparin level 0.3-0.7 units/ml Monitor platelets by anticoagulation protocol: Yes   Plan:  Give 4000 units bolus x 1 Start heparin infusion at 1300 units/hr Check anti-Xa level in 6 hours and daily while on heparin Continue to monitor H&H and platelets  BAlbertina Parr PharmD.  Clinical Pharmacist Pager [phone]

## 2013-05-13 NOTE — Telephone Encounter (Signed)
Pt is very weak, SOB, sweaty. Had a sharp pain in left arm last Monday. He wants to know what to do.  [place]

## 2013-05-13 NOTE — H&P (Signed)
History and Physical   Patient ID: Harold [name] MRN: [id number], DOB/AGE: 1956/01/12 58 y.o. Date of Encounter: 05/13/2013  Primary Physician: [name], MD Primary Cardiologist: [facility]  Chief Complaint:  SOB  HPI: Harold [name] is a 58 y.o. male with a long history of CAD. He was seen in the office on 3/3 complaining of myalgias and felt to have myositis, possibly from a statin. His statin and fenofibrate were placed on hold. He was complaining of dyspnea on exertion at that time but this was felt related to his obesity, pulmonary hypertension and myalgias. He was not having any chest pressure or other symptoms that were felt to be cardiac in nature.  His shortness of breath has worsened since that time. He is currently unable to walk room to room without stopping because of shortness of breath. Additionally, he has had lower extremity edema, right greater than left but Dopplers in the past have been negative for DVT.  One time, he had sharp upper left chest pain that radiated into his left arm. It resolved without nitroglycerin.   Today, he contacted Dr. Ellyn Hack. After Dr. Ellyn Hack reviewed his symptoms, he felt the best option was for the patient to come to the emergency room. Currently in the emergency room, he is resting comfortably. He had ASA 81 mg x 4 but no other medications.  Past Medical History  Diagnosis Date  . Diabetes mellitus   . Carotid artery occlusion   . Complication of anesthesia   . PONV (postoperative nausea and vomiting)     "[place]"  . Hypothyroidism   . Depression     situaltional   . Sleep apnea   . Neuropathy     notably improved following PCI with improved cardiac function  . CAD, multiple vessel 2003, 04/2012    status post PCI to LAD, circumflex-OM 2, RCA  . 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 Dyslipidemia associated with type 2 diabetes mellitus     . GERD (gastroesophageal reflux disease)   . Chronic renal insufficiency, stage II (mild)   . Pulmonary hypertension 04/2012    PA pressure 345mhg  . HTN (hypertension)   . Chronic venous insufficiency     varicosities, no reflux; dopplers 04/14/12- valvular insufficiency in the R and L GSV  . CAD S/P percutaneous coronary angioplasty     see cath and PCI and post surgical history    Surgical History:  Past Surgical History  Procedure Laterality Date  . Umbilical hernia repair  2009    steel mesh insert  . Coronary angioplasty  12/21/2001    PTCA of the distal and mid AV groove circ, unsuccessful PTCA of second OM total occlusion, unsuccessful PTCA of the apical LAD total occlusion  . Colonscopy    . Carotid endarterectomy  2005    Right; recent carotid Dopplers notes elevated velocities.   . Back surgery  2005 x1    X2-2010  . Lumbar laminectomy/decompression microdiscectomy  01/07/2012    Procedure: LUMBAR LAMINECTOMY/DECOMPRESSION MICRODISCECTOMY 1 LEVEL;  Surgeon: KWinfield Cunas MD;  [facility];  Service: Neurosurgery;  Laterality: Bilateral;   Lumbar Three-Four Decompression  . Coronary stent placement  04/28/12    PCI to LAD (3x226mXience DES postdilated to 3.25) and circ prox and mid (2 overlappinmg 2.45m73m2mmXience DES postdilated to 2.745m56m. Cardiac  catheterization  12/11/2001    significant 3V CAD, normal LV function  . Coronary angioplasty with stent placement  04/29/12    PCI to 3 RCA lesions, Promus Premiere 2.22mx8mm distally, mid was 2.570m8mm and proximally 2.75x1616mEF 55-60%  . Doppler echocardiography  04/28/2012    poor quality study: EF estimated 60-65%; unable to assess diastolic function (previously noted to have diastolic dysfunction); severely dilated left atrium and mild right atrium; dilated IVC consistent with increased central venous pressure..  . Carotid doppler  04/27/2012    right internal carotid: Elevated velocities but no evidence of  plaque. Left internal carotid 40-59%  . Abis  04/27/2012    mild bilateral arterial insufficiency     I have reviewed the patient's current medications. Prior to Admission medications   Medication Sig Start Date End Date Taking? Authorizing Provider  aspirin EC 81 MG EC tablet Take 1 tablet (81 mg total) by mouth daily. 04/30/12  Yes Luke K Kilroy, PA-C  atenolol (TENORMIN) 25 MG tablet Take 0.5 tablets (12.5 mg total) by mouth every evening. 11/17/12  Yes DavLeonie ManD  BRILINTA 90 MG TABS tablet TAKE 1 TABLET (90 MG TOTAL) BY MOUTH 2 (TWO) TIMES DAILY. 05/07/13  Yes DavLeonie ManD  cholecalciferol (VITAMIN D) 1000 UNITS tablet Take 1,000 Units by mouth 4 (four) times daily.    Yes Historical Provider, MD  escitalopram (LEXAPRO) 20 MG tablet Take 1 tablet (20 mg total) by mouth daily. NEED VISIT!!   Yes EleTheda SersA-C  esomeprazole (NEXIUM) 40 MG capsule Take 1 capsule (40 mg total) by mouth daily. 09/08/12  Yes SteDarlyne RussianD  felodipine (PLENDIL) 5 MG 24 hr tablet TAKE 1 TABLET (5 MG TOTAL) BY MOUTH DAILY. 02/14/13  Yes DavLeonie ManD  fenofibrate micronized (LOFIBRA) 134 MG capsule TAKE 1 CAPSULE (134 MG TOTAL) BY MOUTH DAILY BEFORE BREAKFAST. 03/07/13  Yes DavLeonie ManD  furosemide (LASIX) 40 MG tablet TAKE 1 TABLET (40 MG TOTAL) BY MOUTH DAILY. PATIENT NEEDS OFFICE VISIT FOR ADDITIONAL REFILLS 04/08/13  Yes SteDarlyne RussianD  Garlic 500756 CAPS Take 500 mg by mouth daily.   Yes Historical Provider, MD  insulin aspart (NOVOLOG) 100 UNIT/ML injection Inject 50 Units into the skin 2 (two) times daily.  04/29/11  Yes SteDarlyne RussianD  insulin NPH (HUMULIN N,NOVOLIN N) 100 UNIT/ML injection Inject 75 Units into the skin 2 (two) times daily before a meal.    Yes Historical Provider, MD  levothyroxine (SYNTHROID, LEVOTHROID) 75 MCG tablet TAKE 1 TABLET BY MOUTH ONCE A DAY 02/23/13  Yes SarMancel BaleA-C  lisinopril (PRINIVIL,ZESTRIL) 20 MG tablet Take 0.5 tablets (10 mg  total) by mouth daily. 11/17/12  Yes DavLeonie ManD  metFORMIN (GLUCOPHAGE) 1000 MG tablet Take 1 tablet (1,000 mg total) by mouth 2 (two) times daily with a meal. 05/02/12  Yes LukErlene QuanA-C  Misc Natural Products (LUTEIN 20 PO) Take 20 mg by mouth daily.   Yes Historical Provider, MD  Multiple Vitamins-Minerals (MULTIVITAMIN WITH MINERALS) tablet Take 1 tablet by mouth daily.   Yes Historical Provider, MD  Omega-3 Fatty Acids (OMEGA-3 FISH OIL PO) Take 1,200 mg by mouth 4 (four) times daily.   Yes Historical Provider, MD  Potassium 99 MG TABS Take 99 mg by mouth daily.   Yes Historical Provider, MD  simvastatin (ZOCOR) 40 MG tablet Take 0.5 tablets (20 mg total) by mouth every  evening. 11/17/12  Yes Leonie Man, MD  TURMERIC PO Take 800 mg by mouth daily.   Yes Historical Provider, MD  fish oil-omega-3 fatty acids 1000 MG capsule Take 1,200 mg by mouth daily.     Historical Provider, MD  nitroGLYCERIN (NITROSTAT) 0.4 MG SL tablet Place 1 tablet (0.4 mg total) under the tongue every 5 (five) minutes as needed for chest pain (severe chest pressure or tightness). 04/30/12   Erlene Quan, PA-C   Scheduled Meds: .  morphine injection  6 mg Intravenous Once   Continuous Infusions:   PRN Meds:.nitroGLYCERIN  Allergies:  Allergies  Allergen Reactions  . Septra [Bactrim] Itching  . Penicillins Hives    Allergy to All cillin drugs  . Other     Walnuts, pecans     History   Social History  . Marital Status: Single    Spouse Name: N/A    Number of Children: N/A  . Years of Education: N/A   Occupational History  . [employer]    Social History Main Topics  . Smoking status: Former Smoker -- 65 years    Quit date: 03/23/2003  . Smokeless tobacco: Never Used  . Alcohol Use: No  . Drug Use: No  . Sexual Activity: Not on file   Other Topics Concern  . Not on file   Social History Narrative   He is a single gentleman a long-term monogamous same sex  relationship , who is also a patient of our clinic. He is no longer undergoing cardiac rehabilitation, but is doing routine exercise.   He works at [employer].   He has really picked up his exercise level.  He walks the platelet there is borderline which he could not come anywhere near doing 6 months ago.  He is out working in the yard.   He does not smoke, he quit in 2009.  He does not drink alcohol.    Family History  Problem Relation Age of Onset  . Adopted: Yes   Family Status  Relation Status Death Age  . Mother Deceased   . Father Deceased     Review of Systems: He admits poor compliance w/ diabetic diet, mainly due to lack of portion control. He is working with a Engineer, maintenance (IT) and a Engineer, water as well. No recent immobility, fever or chills. He does not exercise regularly. Full 14-point review of systems otherwise negative except as noted above.  Physical Exam: Blood pressure 126/52, pulse 58, temperature 97.8 F (36.6 C), temperature source Oral, resp. rate 10, height _0  (1.854 m), weight 313 lb (141.976 kg), SpO2 97.00%. General: Well developed, well nourished,male in no acute distress. Head: Normocephalic, atraumatic, sclera non-icteric, no xanthomas, nares are without discharge. Dentition: good Neck: No carotid bruits. CEA scar noted on right. JVD not elevated. No thyromegally Lungs: Good expansion bilaterally. without wheezes or rhonchi.  Heart: Regular rate and rhythm with S1 S2.  No S3 or S4.  No murmur, no rubs, or gallops appreciated. Abdomen: Soft, non-tender, non-distended with normoactive bowel sounds. No hepatomegaly. No rebound/guarding. No obvious abdominal masses. Msk:  Strength and tone appear normal for age. No joint deformities or effusions, no spine or costo-vertebral angle tenderness. Extremities: No clubbing or cyanosis. No edema on left, 1+ on right.  Distal pedal pulses are 2+ in 4 extrem except slightly decreased in RLE due to edema Neuro:  Alert and oriented X 3. Moves all extremities spontaneously. No focal deficits noted. Psych:  Responds to questions  appropriately with a normal affect. Skin: No rashes or lesions noted  Labs:   Lab Results  Component Value Date   WBC 6.1 05/13/2013   HGB 14.1 05/13/2013   HCT 38.8* 05/13/2013   MCV 86.6 05/13/2013   PLT 187 05/13/2013     Recent Labs Lab 05/13/13 1200  NA 139  K 4.7  CL 99  CO2 18*  BUN 18  CREATININE 1.08  CALCIUM 9.9  GLUCOSE 75    Recent Labs  05/13/13 1200  CKTOTAL 459*    Recent Labs  05/13/13 1216  TROPIPOC 0.01   Pro B Natriuretic peptide (BNP)  Date/Time Value Ref Range Status  05/13/2013 12:00 PM 98.6  0 - 125 pg/mL Final   Radiology/Studies: Dg Chest 2 View 05/13/2013   CLINICAL DATA:  Increased shortness of breath.  EXAM: CHEST  2 VIEW  COMPARISON:  DG CHEST 2 VIEW dated 04/23/2012  FINDINGS: The heart size and mediastinal contours are within normal limits. Both lungs are clear. The visualized skeletal structures are unremarkable.  IMPRESSION: No active cardiopulmonary disease.   Electronically Signed   By: Margaree Mackintosh M.D.   On: 05/13/2013 12:22     Cardiac Cath: Cath 2003, w/ PTCA of mid-CFX, OM, unsuccessful PCI of LAD  04/27/2012 Coronary Anatomy:  Right Coronary Artery: Moderate to large caliber, dominant vessel with an ostial ~70% lesion (Lesion #3) followed by diffuse mild disease until three tandem ~90-95% lesions (Lesion #2) in the mid vessel around the RV marginal. After a segment without significant disease, there is a 70-80% lesion proximal to the bifurcation (Lesion #1) into the rPDA and Right Posterior AV Groove branch (RPAV)  RPDA: small to moderate caliber, diffusely diseased branch with a mid ~60% lesion. It reaches ~1/2 way to the apex. Percutaneous Coronary Intervention: This was a a complex procedure due to the multiple lesions, the use of AngioSculpt scoring balloon, and the presence of an Ostial lesion of a Main Coronary  Artery, multiple guidewires, and difference sized stents.  Guide: 6 Fr JR 4 with side holes  Guidewires:  Pro-water -- used for initial predilation of all 3 segments, and Lesion #1 Stent, then has a buddy wire for the remainder the procedure. For the ostial lesion it was used as a marker of the ostium and not actually in the coronary.  Luge -- used as a second wire after I was unable to advance the AngioSculpt predilation balloon or stent beyond the distal stenosis of Lesion #2. Following this his uses were course of the remainder the case. Predilation Balloon: Mini Trek 2 mm x 8 mm; used for each lesion  Lesion #3: 3 inflations at 8 Atm -- 8 Sec, 30 Sec and 10 Sec -- insuring that the ostium was dilated into the Aorta.  Lesion #2: 8 Atm x 30 Sec x 3 inflations one for each stenosis in the mid lesion segment  Lesion #1: 8 Atm x 30 Sec Lesion #1: Focal distal RCA  Stent #1: Promus Premier 2.25 mm x 8 mm; deployed at 12 Atm x 30 Sec Postdilated with stent balloon: 16 Atm x 48 Sec, final diameter 2.45 mm Lesion #2: Mid RCA, 3 sequential 80-95% stenoses with irregularity bordering both ends  AngioSculpt "scoring balloon" 2.5 mm x 15 mm Initial inflation over pro-water wire and proximal stenosis of Lesion #2: 4 Atm x 30 Sec The balloon would not advance beyond the beginning portion of the third stenosis. The balloon was removed and then readvanced over  the Luge wire was it was advanced into the distal RCA. The balloon was then easily passed to cover each stenosis of lesion.  From distal to proximal, 2 inflations: 6 Atm x 30 Sec Stent #2: Promus Premier 2.5 mm x 28 mm; deployed at 12 Atm x 30 Sec Postdilated with stent balloon: 16 Atm x 45 Sec, final diameter 2.72 mm Post-dilation Balloon: [place] Quantum Apex 2.75 mm x 15 mm; inflated distal to proximal with 3 successive inflations:  18 Atm x 45 Sec -- Final Diameter: 2.85 millimeter Lesion #3: Ostial RCA  Predilation of ostium with Lesion #2  post-dilation balloon: [place] Quantum Apex 2.75 mm x 15 mm 8 Atm x 30 Sec The pro-water wire was advanced out of the distal end of the guide catheter into the aorta to marked the Aorto Ostium.  Stent #3: Promus Premier 2.75 mm x 16 mm; deployed at 12 Atm x 30 Sec Postdilated with stent balloon: 16 Atm x 45 Sec, final diameter 2.96 mm Post-dilation Balloon: [place] Quantum Apex 3.8 mm x 84m;  16 Atm x 45 Sec -- Final Diameter: 3.1 millimeter  Flaring in the ostium: 18 Atm x 45 Sec  Post deployment angiography revealed excellent lesion coverage with appropriate stent dilation. There was no evidence of dissection or perforation. POST-OPERATIVE DIAGNOSIS:  Successful multisite PCI of the ostial, mid and distal RCA with 3 drug-eluting stents:  Ostial: Promus Premier DES 2.75 mm 16 mm; postdilated to 3.1 mm  Mid (3 sequential stenoses): Promus Premier DES 2.5 mm 28 mm ; postdilated to 2.85 mm  Distal: Promus Premier DES 2.25 mm 8 mm; postdilated to 2.45 mm PLAN OF CARE:  Post Radial cath care - anticipate discharge home in AM  DAPT indefinitely with 6 DES stents.  Will continue with Ranexa and for 1 month post PCI    Echo: 04/28/2012 Study Conclusions - Procedure narrative: Transthoracic echocardiography. Image quality was poor. The study was technically difficult, as a result of poor acoustic windows, poor sound wave transmission, and body habitus. - Left ventricle: The cavity size was normal. There was mild concentric hypertrophy. Systolic function was normal. The estimated ejection fraction was in the range of 60% to 65%. The study is not technically sufficient to allow evaluation of LV diastolic function. - Aortic valve: Trivial regurgitation. - Left atrium: Severely dilated (40 ml/m2). - Right atrium: The atrium was mildly dilated. - Tricuspid valve: Mild regurgitation. - Pulmonary arteries: PA peak pressure: 588mHg (S). - Inferior vena cava: The vessel was dilated; the respirophasic  diameter changes were blunted (< 50%); findings are consistent with elevated central venous pressure.  ECG: SR, rate 59, artifact in V4, no acute ischemic changes.  ASSESSMENT AND PLAN:  Principal Problem: 1.  DOE (dyspnea on exertion) - Possible anginal equivalent; Also with pulm HTN, possible obesity hypoventilation, possible deconditioning. Admit, cycle enzymes. MD advise on R/L heart cath. Will ck echo  Active Problems:   Diabetes mellitus, type 2 IDDM with neuropathy - continue home Rx    Hypertension - BP OK on current Rx. Follow.    Hyperlipidemia - ck profile, continue Rx    Sleep apnea on C-pap - cpap per respiratory    Myalgias: Elevation of pt's CK may be suggestive of myalgias secondary to statin use.  LFTs, CKMB, Renal function, VIt D level, TSH to r/o rhabdomyolysis or statins.     Signed, JoDahlia BailiffPA-S  Seen and agree, w/ changes made. RhRosaria FerriesPA-C 05/13/2013 5:10 PM Beeper  [phone]   Personally seen and examined, agree with above. BNP currently normal, chest x-ray does not appear to have overt pulmonary edema. Lungs do not have any rales. His symptoms are severe exertional dyspnea upon minimal exertion. These are similar to his symptoms prior to his last stent placements. He is concerned about his coronary arteries.  The plan currently is to perform right and left heart catheterization. Radial artery approach would be preferable with brachial vein for right heart cath. Risks of stroke, heart attack, death, renal impairment, bleeding have been discussed with patient. He is willing to proceed. His severe dyspnea may certainly be his anginal equivalent.  Candee Furbish, MD

## 2013-05-13 NOTE — ED Provider Notes (Signed)
CSN: [id number]     Arrival date & time 05/13/13  1131 History   First MD Initiated Contact with Patient 05/13/13 1151     Chief Complaint  Patient presents with  . Shortness of Breath     (Consider location/radiation/quality/duration/timing/severity/associated sxs/prior Treatment) HPI  58 year old obese male with extensive medical history including insulin-dependent diabetes, CAD history with 6 stents placement, sleep apnea, GERD, and renal insufficiency who presents for evaluation of increased shortness of breath. Patient reports for the past month he has been feeling increasingly tired having shortness of breath and chest discomfort especially with exertion. States he gets tired on walking a short distance and also having trouble breathing simply by talking on the phone. He also has been noticing intermittent left arm pain which he described as a sharp sensation lasting for minutes and resolved without any specific treatment. He also endorsed sensation of lightheadedness and dizziness and has been breaking out into sweats every time he exerts himself. He also notice occasional swelling of both of his legs however it has improved since having the cardiac stents a year ago. He also endorsed having muscle aches which is Dr. initially thought it was related to his cholesterol medication. He has been off his cholesterol medications since March 4 but continues to endorse muscle aches. This symptom is getting progressive currently complaining of 3/10 chest pressure and abdominal pain along with shortness of no prior history of PE or DVT, no recent surgery, no prolonged bed rest, no unilateral leg swelling, no history of cancer.  He did consult his cardiologist, Dr. Ellyn Hack about this complaint and was recommended to come to ER for further evaluation. Patient sleeps with 2 pillows, that has not changed.  Past Medical History  Diagnosis Date  . Diabetes mellitus   . Carotid artery occlusion   .  Complication of anesthesia   . PONV (postoperative nausea and vomiting)     "[place]"  . Hypothyroidism   . Depression     situaltional   . Sleep apnea   . Neuropathy     notably improved following PCI with improved cardiac function  . CAD, multiple vessel 2003, 04/2012    status post PCI to LAD, circumflex-OM 2, RCA  . 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 Dyslipidemia associated with type 2 diabetes mellitus   . GERD (gastroesophageal reflux disease)   . Chronic renal insufficiency, stage II (mild)   . Pulmonary hypertension 04/2012    PA pressure 51mhg  . HTN (hypertension)   . Chronic venous insufficiency     varicosities, no reflux; dopplers 04/14/12- valvular insufficiency in the R and L GSV  . CAD S/P percutaneous coronary angioplasty     see cath and PCI and post surgical history   Past Surgical History  Procedure Laterality Date  . Umbilical hernia repair  2009    steel mesh insert  . Coronary angioplasty  12/21/2001    PTCA of the distal and mid AV groove circ, unsuccessful PTCA of second OM total occlusion, unsuccessful PTCA of the apical LAD total occlusion  . Colonscopy    . Carotid endarterectomy  2005    Right; recent carotid Dopplers notes elevated velocities.   . Back surgery  2005 x1    X2-2010  . Lumbar laminectomy/decompression microdiscectomy  01/07/2012    Procedure: LUMBAR LAMINECTOMY/DECOMPRESSION MICRODISCECTOMY 1 LEVEL;  Surgeon: KWinfield Cunas MD;  Location: [facility];  Service: Neurosurgery;  Laterality: Bilateral;   Lumbar Three-Four Decompression  . Coronary stent placement  04/28/12    PCI to LAD (3x35m Xience DES postdilated to 3.25) and circ prox and mid (2 overlappinmg 2.559m12mmXience DES postdilated to 2.7566m . Cardiac catheterization  12/11/2001    significant 3V CAD, normal LV function  . Coronary angioplasty with stent placement  04/29/12     PCI to 3 RCA lesions, Promus Premiere 2.82m70mm distally, mid was 2.5mx264m and proximally 2.75x16mm,54m55-60%  . Doppler echocardiography  04/28/2012    poor quality study: EF estimated 60-65%; unable to assess diastolic function (previously noted to have diastolic dysfunction); severely dilated left atrium and mild right atrium; dilated IVC consistent with increased central venous pressure..  . Carotid doppler  04/27/2012    right internal carotid: Elevated velocities but no evidence of plaque. Left internal carotid 40-59%  . Abis  04/27/2012    mild bilateral arterial insufficiency   Family History  Problem Relation Age of Onset  . Adopted: Yes   History  Substance Use Topics  . Smoking status: Former Smoker -- 42 yea37    Quit date: 03/23/2003  . Smokeless tobacco: Never Used  . Alcohol Use: No    Review of Systems  All other systems reviewed and are negative.      Allergies  Septra; Penicillins; and Other  Home Medications   Current Outpatient Rx  Name  Route  Sig  Dispense  Refill  . aspirin EC 81 MG EC tablet   Oral   Take 1 tablet (81 mg total) by mouth daily.         . atenMarland Kitchenlol (TENORMIN) 25 MG tablet   Oral   Take 0.5 tablets (12.5 mg total) by mouth every evening.   30 tablet   11   . BRILINTA 90 MG TABS tablet      TAKE 1 TABLET (90 MG TOTAL) BY MOUTH 2 (TWO) TIMES DAILY.   60 tablet   11   . cholecalciferol (VITAMIN D) 1000 UNITS tablet   Oral   Take 1,000 Units by mouth 4 (four) times daily.          . esciMarland Kitchenalopram (LEXAPRO) 20 MG tablet   Oral   Take 1 tablet (20 mg total) by mouth daily. NEED VISIT!!   30 tablet   2   . esomeprazole (NEXIUM) 40 MG capsule   Oral   Take 1 capsule (40 mg total) by mouth daily.   30 capsule   11   . felodipine (PLENDIL) 5 MG 24 hr tablet      TAKE 1 TABLET (5 MG TOTAL) BY MOUTH DAILY.   90 tablet   2   . fenofibrate micronized (LOFIBRA) 134 MG capsule      TAKE 1 CAPSULE (134 MG TOTAL) BY MOUTH  DAILY BEFORE BREAKFAST.   90 capsule   2   . fish oil-omega-3 fatty acids 1000 MG capsule   Oral   Take 1,200 mg by mouth daily.          . furosemide (LASIX) 40 MG tablet      TAKE 1 TABLET (40 MG TOTAL) BY MOUTH DAILY. PATIENT NEEDS OFFICE VISIT FOR ADDITIONAL REFILLS   30 tablet   1   . Garlic 500 MG258PS   Oral   Take 500 mg by mouth daily.         . insulin aspart (NOVOLOG) 100 UNIT/ML injection  Subcutaneous   Inject 50 Units into the skin 2 (two) times daily.          . insulin NPH (HUMULIN N,NOVOLIN N) 100 UNIT/ML injection   Subcutaneous   Inject 75 Units into the skin 2 (two) times daily before a meal.          . levothyroxine (SYNTHROID, LEVOTHROID) 75 MCG tablet      TAKE 1 TABLET BY MOUTH ONCE A DAY   90 tablet   0   . lisinopril (PRINIVIL,ZESTRIL) 20 MG tablet   Oral   Take 0.5 tablets (10 mg total) by mouth daily.   30 tablet   11   . metFORMIN (GLUCOPHAGE) 1000 MG tablet   Oral   Take 1 tablet (1,000 mg total) by mouth 2 (two) times daily with a meal.         . Misc Natural Products (LUTEIN 20 PO)   Oral   Take 20 mg by mouth daily.         . Multiple Vitamins-Minerals (MULTIVITAMIN WITH MINERALS) tablet   Oral   Take 1 tablet by mouth daily.         . nitroGLYCERIN (NITROSTAT) 0.4 MG SL tablet   Sublingual   Place 1 tablet (0.4 mg total) under the tongue every 5 (five) minutes as needed for chest pain (severe chest pressure or tightness).   25 tablet   2   . Potassium 99 MG TABS   Oral   Take 99 mg by mouth daily.         . simvastatin (ZOCOR) 40 MG tablet   Oral   Take 0.5 tablets (20 mg total) by mouth every evening.   30 tablet   11    BP 139/69  Pulse 65  Temp(Src) 97.8 F (36.6 C) (Oral)  Resp 22  Ht _0  (1.854 m)  Wt 313 lb (141.976 kg)  BMI 41.30 kg/m2  SpO2 97% Physical Exam  Nursing note and vitals reviewed. Constitutional: He is oriented to person, place, and time. He appears well-developed and  well-nourished. No distress.  Mobility obese  HENT:  Head: Normocephalic and atraumatic.  Eyes: Conjunctivae are normal.  Neck: Normal range of motion. Neck supple. No JVD present.  Cardiovascular: Normal rate and regular rhythm.   Pulmonary/Chest: Effort normal and breath sounds normal. No respiratory distress. He has no wheezes. He has no rales. He exhibits no tenderness.  Abdominal: He exhibits distension. There is no tenderness. There is no rebound.  Musculoskeletal: He exhibits no edema.  Neurological: He is alert and oriented to person, place, and time.  Skin: No rash noted.  Psychiatric: He has a normal mood and affect.    ED Course  Procedures (including critical care time)  12:13 PM Patient here with pain suggestive of unstable angina. Workup initiated. Pain medication given. Low suspicion for DVT or PE. No obvious signs to suggest CHF.    1:53 PM Pt currently CP free.  ECG, trop, and CXR without acute changes.  CK is elevated at 459.  Will consult cardiology for further evaluation.    2:59 PM I have consulted with cardiology, and a provider will see pt in ER, evaluate and admit for cardiac rule out.  Although pt has elevated CK, low suspicion for rhabdo based on result.  Care discussed with Dr. Lacinda Axon.    3:11 PM Pt is aware of plan.  currrently sxs free.    Labs Review Labs Reviewed  CK - Abnormal;  Notable for the following:    Total CK 459 (*)    All other components within normal limits  PRO B NATRIURETIC PEPTIDE  CBC  BASIC METABOLIC PANEL  I-STAT TROPOININ, ED   Imaging Review Dg Chest 2 View  05/13/2013   CLINICAL DATA:  Increased shortness of breath.  EXAM: CHEST  2 VIEW  COMPARISON:  DG CHEST 2 VIEW dated 04/23/2012  FINDINGS: The heart size and mediastinal contours are within normal limits. Both lungs are clear. The visualized skeletal structures are unremarkable.  IMPRESSION: No active cardiopulmonary disease.   Electronically Signed   By: Margaree Mackintosh M.D.    On: 05/13/2013 12:22     EKG Interpretation None      Date: 05/13/2013  Rate: 67  Rhythm: normal sinus rhythm  QRS Axis: normal  Intervals: normal  ST/T Wave abnormalities: normal  Conduction Disutrbances: none  Narrative Interpretation:   Old EKG Reviewed: No significant changes noted     MDM   Final diagnoses:  Unstable angina  Myalgia and myositis    BP 126/52  Pulse 58  Temp(Src) 97.8 F (36.6 C) (Oral)  Resp 10  Ht _0  (1.854 m)  Wt 313 lb (141.976 kg)  BMI 41.30 kg/m2  SpO2 97%  I have reviewed nursing notes and vital signs. I personally reviewed the imaging tests through PACS system  I reviewed available ER/hospitalization records thought the EMR     Domenic Moras, [place] 05/13/13 1626

## 2013-05-14 ENCOUNTER — Encounter (HOSPITAL_COMMUNITY)
Admission: EM | Disposition: A | Discharge status: Home / Self Care | Payer: Self-pay | Source: Home / Self Care | Attending: Cardiology

## 2013-05-14 ENCOUNTER — Encounter (HOSPITAL_COMMUNITY): Payer: Self-pay | Admitting: General Practice

## 2013-05-14 DIAGNOSIS — I2510 Atherosclerotic heart disease of native coronary artery without angina pectoris: Principal | ICD-10-CM

## 2013-05-14 HISTORY — PX: LEFT AND RIGHT HEART CATHETERIZATION WITH CORONARY ANGIOGRAM: SHX5449

## 2013-05-14 LAB — LIPID PANEL
Cholesterol: 262 mg/dL — ABNORMAL HIGH (ref 0–200)
HDL: 43 mg/dL (ref >39)
LDL Cholesterol: UNDETERMINED mg/dL (ref 0–99)
TRIGLYCERIDES: 569 mg/dL — AB (ref <150)
Total CHOL/HDL Ratio: 6.1 RATIO
VLDL: UNDETERMINED mg/dL (ref 0–40)

## 2013-05-14 LAB — COMPREHENSIVE METABOLIC PANEL
ALT: 52 U/L (ref 0–53)
AST: 42 U/L — ABNORMAL HIGH (ref 0–37)
Albumin: 3.9 g/dL (ref 3.5–5.2)
Alkaline Phosphatase: 58 U/L (ref 39–117)
BUN: 16 mg/dL (ref 6–23)
CO2: 24 mEq/L (ref 19–32)
CREATININE: 1.13 mg/dL (ref 0.50–1.35)
Calcium: 9.4 mg/dL (ref 8.4–10.5)
Chloride: 98 mEq/L (ref 96–112)
GFR calc Af Amer: 80 mL/min — ABNORMAL LOW (ref >90)
GFR calc non Af Amer: 69 mL/min — ABNORMAL LOW (ref >90)
Glucose, Bld: 197 mg/dL — ABNORMAL HIGH (ref 70–99)
Potassium: 4 mEq/L (ref 3.7–5.3)
Sodium: 139 mEq/L (ref 137–147)
Total Bilirubin: 0.4 mg/dL (ref 0.3–1.2)
Total Protein: 7 g/dL (ref 6.0–8.3)

## 2013-05-14 LAB — POCT I-STAT 3, VENOUS BLOOD GAS (G3P V)
Acid-base deficit: 1 mmol/L (ref 0.0–2.0)
Acid-base deficit: 2 mmol/L (ref 0.0–2.0)
Bicarbonate: 23.4 mEq/L (ref 20.0–24.0)
Bicarbonate: 24 mEq/L (ref 20.0–24.0)
O2 Saturation: 65 %
O2 Saturation: 96 %
PCO2 VEN: 37.7 mmHg — AB (ref 45.0–50.0)
PH VEN: 7.354 — AB (ref 7.250–7.300)
TCO2: 25 mmol/L (ref 0–100)
TCO2: 25 mmol/L (ref 0–100)
pCO2, Ven: 43 mmHg — ABNORMAL LOW (ref 45.0–50.0)
pH, Ven: 7.401 — ABNORMAL HIGH (ref 7.250–7.300)
pO2, Ven: 35 mmHg (ref 30.0–45.0)
pO2, Ven: 81 mmHg — ABNORMAL HIGH (ref 30.0–45.0)

## 2013-05-14 LAB — GLUCOSE, CAPILLARY
GLUCOSE-CAPILLARY: 205 mg/dL — AB (ref 70–99)
Glucose-Capillary: 149 mg/dL — ABNORMAL HIGH (ref 70–99)
Glucose-Capillary: 201 mg/dL — ABNORMAL HIGH (ref 70–99)
Glucose-Capillary: 213 mg/dL — ABNORMAL HIGH (ref 70–99)
Glucose-Capillary: 225 mg/dL — ABNORMAL HIGH (ref 70–99)

## 2013-05-14 LAB — PROTIME-INR
INR: 1.03 (ref 0.00–1.49)
Prothrombin Time: 13.3 seconds (ref 11.6–15.2)

## 2013-05-14 LAB — TROPONIN I
Troponin I: <0.3 ng/mL (ref <0.30)
Troponin I: <0.3 ng/mL (ref <0.30)

## 2013-05-14 LAB — HEPARIN LEVEL (UNFRACTIONATED): HEPARIN UNFRACTIONATED: 0.16 [IU]/mL — AB (ref 0.30–0.70)

## 2013-05-14 LAB — TSH: TSH: 1.395 u[IU]/mL (ref 0.350–4.500)

## 2013-05-14 SURGERY — LEFT AND RIGHT HEART CATHETERIZATION WITH CORONARY ANGIOGRAM
Anesthesia: LOCAL

## 2013-05-14 MED ORDER — MIDAZOLAM HCL 2 MG/2ML IJ SOLN
INTRAMUSCULAR | Status: AC
  Filled 2013-05-14: qty 2

## 2013-05-14 MED ORDER — LIDOCAINE HCL (PF) 1 % IJ SOLN
INTRAMUSCULAR | Status: AC
Start: 2013-05-14 — End: 2013-05-14
  Filled 2013-05-14: qty 30

## 2013-05-14 MED ORDER — SODIUM CHLORIDE 0.9 % IV SOLN
INTRAVENOUS | Status: DC
  Administered 2013-05-14: 17:00:00 via INTRAVENOUS

## 2013-05-14 MED ORDER — NITROGLYCERIN 0.2 MG/ML ON CALL CATH LAB
INTRAVENOUS | Status: AC
  Filled 2013-05-14: qty 1

## 2013-05-14 MED ORDER — FENTANYL CITRATE 0.05 MG/ML IJ SOLN
INTRAMUSCULAR | Status: AC
  Filled 2013-05-14: qty 2

## 2013-05-14 MED ORDER — HEPARIN (PORCINE) IN NACL 2-0.9 UNIT/ML-% IJ SOLN
INTRAMUSCULAR | Status: AC
  Filled 2013-05-14: qty 1000

## 2013-05-14 MED ORDER — HEPARIN SODIUM (PORCINE) 5000 UNIT/ML IJ SOLN
5000.0000 [IU] | Freq: Three times a day (TID) | INTRAMUSCULAR | Status: DC
  Administered 2013-05-15: 5000 [IU] via SUBCUTANEOUS
  Filled 2013-05-14 (×4): qty 1

## 2013-05-14 MED ORDER — HEPARIN BOLUS VIA INFUSION
4000.0000 [IU] | Freq: Once | INTRAVENOUS | Status: AC
  Administered 2013-05-14: 4000 [IU] via INTRAVENOUS
  Filled 2013-05-14: qty 4000

## 2013-05-14 NOTE — Progress Notes (Signed)
ANTICOAGULATION CONSULT NOTE - Follow Up Consult  Pharmacy Consult for heparin Indication: chest pain/ACS  Labs:  Recent Labs  05/13/13 1200 05/13/13 1657 05/14/13 0002 05/14/13 0245  HGB 14.1  --   --   --   HCT 38.8*  --   --   --   PLT 187  --   --   --   LABPROT  --   --  13.3  --   INR  --   --  1.03  --   HEPARINUNFRC  --   --   --  <0.10*  CREATININE 1.08  --   --  1.13  CKTOTAL 459* 380*  --   --   CKMB  --  6.7*  --   --   TROPONINI  --   --  <0.30 <0.30    Assessment: 58yo male undetectable on heparin with initial dosing for CP.  Goal of Therapy:  Heparin level 0.3-0.7 units/ml   Plan:  Will rebolus with heparin 4000 units and increase gtt by 4 units/kg/hr to 1800 units/hr and check level in 6hr.  Wynona Neat, PharmD, BCPS  05/14/2013,5:42 AM

## 2013-05-14 NOTE — Interval H&P Note (Signed)
Cath Lab Visit (complete for each Cath Lab visit)  Clinical Evaluation Leading to the Procedure:   ACS: no  Non-ACS:    Anginal Classification: CCS III  Anti-ischemic medical therapy: Maximal Therapy (2 or more classes of medications)  Non-Invasive Test Results: No non-invasive testing performed  Prior CABG: No previous CABG      History and Physical Interval Note:  05/14/2013 3:01 PM  Youlanda Mighty  has presented today for surgery, with the diagnosis of sob  The various methods of treatment have been discussed with the patient and family. After consideration of risks, benefits and other options for treatment, the patient has consented to  Procedure(s): LEFT AND RIGHT HEART CATHETERIZATION WITH CORONARY ANGIOGRAM (N/A) as a surgical intervention .  The patient's history has been reviewed, patient examined, no change in status, stable for surgery.  I have reviewed the patient's chart and labs.  Questions were answered to the patient's satisfaction.     KELLY,THOMAS A

## 2013-05-14 NOTE — H&P (View-Only) (Signed)
SUBJECTIVE: No chest pain or SOB this am but mostly he has been in bed.   BP 163/74  Pulse 60  Temp(Src) 97.6 F (36.4 C) (Oral)  Resp 19  Ht 6' 1" (1.854 m)  Wt 317 lb 10.9 oz (144.1 kg)  BMI 41.92 kg/m2  SpO2 96% No intake or output data in the 24 hours ending 05/14/13 0950  PHYSICAL EXAM General: Obese WM. No acute distress. Alert and oriented x 3.  Psych:  Good affect, responds appropriately Neck: No JVD. No masses noted.  Lungs: Clear bilaterally with no wheezes or rhonci noted.  Heart: RRR with no murmurs noted. Abdomen: Bowel sounds are present. Soft, non-tender.  Extremities: No lower extremity edema.   LABS: Basic Metabolic Panel:  Recent Labs  05/13/13 1200 05/14/13 0245  NA 139 139  K 4.7 4.0  CL 99 98  CO2 18* 24  GLUCOSE 75 197*  BUN 18 16  CREATININE 1.08 1.13  CALCIUM 9.9 9.4   CBC:  Recent Labs  05/13/13 1200  WBC 6.1  HGB 14.1  HCT 38.8*  MCV 86.6  PLT 187   Cardiac Enzymes:  Recent Labs  05/13/13 1200 05/13/13 1657 05/14/13 0002 05/14/13 0245  CKTOTAL 459* 380*  --   --   CKMB  --  6.7*  --   --   TROPONINI  --   --  <0.30 <0.30   Fasting Lipid Panel:  Recent Labs  05/14/13 0245  CHOL 262*  HDL 43  LDLCALC UNABLE TO CALCULATE IF TRIGLYCERIDE OVER 400 mg/dL  TRIG 569*  CHOLHDL 6.1    Current Meds: . aspirin EC  81 mg Oral Daily  . atenolol  12.5 mg Oral QPM  . cholecalciferol  1,000 Units Oral QID  . diazepam  5 mg Oral On Call  . escitalopram  20 mg Oral Daily  . felodipine  5 mg Oral Daily  . furosemide  40 mg Oral Daily  . insulin aspart  0-15 Units Subcutaneous TID WC  . insulin aspart  0-5 Units Subcutaneous QHS  . insulin aspart  50 Units Subcutaneous BID WC  . insulin NPH Human  75 Units Subcutaneous BID AC & HS  . levothyroxine  75 mcg Oral QAC breakfast  . lisinopril  10 mg Oral Daily  .  morphine injection  6 mg Intravenous Once  . multivitamin with minerals  1 tablet Oral Daily  . omega-3 acid  ethyl esters  1 g Oral Daily  . pantoprazole  80 mg Oral Q1200  . sodium chloride  3 mL Intravenous Q12H  . Ticagrelor  90 mg Oral BID     ASSESSMENT AND PLAN:  1. CAD/unstable angina: Pt admitted yesterday on recs of Dr. Ellyn Hack with worsened dyspnea which is c/w with his angina before last cath/PCI. Last cath March 2014 with stents placed. He was placed on the cath schedule last night for right and left heart cath. Radial approach with brachial IV for right heart cath would be favorable. Labs reviewed. Risks and benefits reviewed. He agrees to proceed with cath.    Marilynne Dupuis  3/27/20159:50 AM

## 2013-05-14 NOTE — Progress Notes (Signed)
     SUBJECTIVE: No chest pain or SOB this am but mostly he has been in bed.   BP 163/74  Pulse 60  Temp(Src) 97.6 F (36.4 C) (Oral)  Resp 19  Ht 6' 1" (1.854 m)  Wt 317 lb 10.9 oz (144.1 kg)  BMI 41.92 kg/m2  SpO2 96% No intake or output data in the 24 hours ending 05/14/13 0950  PHYSICAL EXAM General: Obese WM. No acute distress. Alert and oriented x 3.  Psych:  Good affect, responds appropriately Neck: No JVD. No masses noted.  Lungs: Clear bilaterally with no wheezes or rhonci noted.  Heart: RRR with no murmurs noted. Abdomen: Bowel sounds are present. Soft, non-tender.  Extremities: No lower extremity edema.   LABS: Basic Metabolic Panel:  Recent Labs  05/13/13 1200 05/14/13 0245  NA 139 139  K 4.7 4.0  CL 99 98  CO2 18* 24  GLUCOSE 75 197*  BUN 18 16  CREATININE 1.08 1.13  CALCIUM 9.9 9.4   CBC:  Recent Labs  05/13/13 1200  WBC 6.1  HGB 14.1  HCT 38.8*  MCV 86.6  PLT 187   Cardiac Enzymes:  Recent Labs  05/13/13 1200 05/13/13 1657 05/14/13 0002 05/14/13 0245  CKTOTAL 459* 380*  --   --   CKMB  --  6.7*  --   --   TROPONINI  --   --  <0.30 <0.30   Fasting Lipid Panel:  Recent Labs  05/14/13 0245  CHOL 262*  HDL 43  LDLCALC UNABLE TO CALCULATE IF TRIGLYCERIDE OVER 400 mg/dL  TRIG 569*  CHOLHDL 6.1    Current Meds: . aspirin EC  81 mg Oral Daily  . atenolol  12.5 mg Oral QPM  . cholecalciferol  1,000 Units Oral QID  . diazepam  5 mg Oral On Call  . escitalopram  20 mg Oral Daily  . felodipine  5 mg Oral Daily  . furosemide  40 mg Oral Daily  . insulin aspart  0-15 Units Subcutaneous TID WC  . insulin aspart  0-5 Units Subcutaneous QHS  . insulin aspart  50 Units Subcutaneous BID WC  . insulin NPH Human  75 Units Subcutaneous BID AC & HS  . levothyroxine  75 mcg Oral QAC breakfast  . lisinopril  10 mg Oral Daily  .  morphine injection  6 mg Intravenous Once  . multivitamin with minerals  1 tablet Oral Daily  . omega-3 acid  ethyl esters  1 g Oral Daily  . pantoprazole  80 mg Oral Q1200  . sodium chloride  3 mL Intravenous Q12H  . Ticagrelor  90 mg Oral BID     ASSESSMENT AND PLAN:  1. CAD/unstable angina: Pt admitted yesterday on recs of Dr. Ellyn Hack with worsened dyspnea which is c/w with his angina before last cath/PCI. Last cath March 2014 with stents placed. He was placed on the cath schedule last night for right and left heart cath. Radial approach with brachial IV for right heart cath would be favorable. Labs reviewed. Risks and benefits reviewed. He agrees to proceed with cath.    MCALHANY,CHRISTOPHER  3/27/20159:50 AM

## 2013-05-15 ENCOUNTER — Other Ambulatory Visit: Payer: Self-pay | Admitting: Cardiology

## 2013-05-15 DIAGNOSIS — R079 Chest pain, unspecified: Secondary | ICD-10-CM | POA: Diagnosis present

## 2013-05-15 DIAGNOSIS — I10 Essential (primary) hypertension: Secondary | ICD-10-CM

## 2013-05-15 LAB — GLUCOSE, CAPILLARY
GLUCOSE-CAPILLARY: 112 mg/dL — AB (ref 70–99)
GLUCOSE-CAPILLARY: 124 mg/dL — AB (ref 70–99)

## 2013-05-15 LAB — HEPARIN LEVEL (UNFRACTIONATED): Heparin Unfractionated: <0.1 IU/mL — ABNORMAL LOW (ref 0.30–0.70)

## 2013-05-15 MED ORDER — FELODIPINE ER 10 MG PO TB24: 10.0000 mg | ORAL_TABLET | Freq: Every day | ORAL | Status: DC

## 2013-05-15 MED ORDER — FUROSEMIDE 40 MG PO TABS: 40.0000 mg | ORAL_TABLET | Freq: Two times a day (BID) | ORAL | Status: DC

## 2013-05-15 MED ORDER — METFORMIN HCL 1000 MG PO TABS: 1000.0000 mg | ORAL_TABLET | Freq: Two times a day (BID) | ORAL | Status: DC

## 2013-05-15 NOTE — CV Procedure (Signed)
Harold [name] is a 58 y.o. male    [id number]  [id number] LOCATION:  FACILITY: [facility]  PHYSICIAN: Troy Sine, MD, [facility] Apr 20, 1955   DATE OF PROCEDURE:  05/15/2013    CARDIAC CATHETERIZATION     HISTORY:   [name] is a 58 year old male who has established coronary artery disease and has undergone prior stenting to his LAD, proximal and mid AV groove circumflex with known subtotal occlusion and a diminutive marginal branch, and stenting to the RCA ostium, mid RCA and distal RCA. He has developed recurrent chest pain. He also has noticed increasing shortness of breath. He is referred for right and left heart catheterization.   PROCEDURE:  The patient was brought to the second floor [facility] cath lab in the postabsorptive state. He was premedicated with Versed 2 mg and fentanyl 50 mcg. His right groin was prepped and shaved in usual sterile fashion. Xylocaine 1% was used for local anesthesia. A 5 French arterial and 7 French venous sheath were inserted into the right femoral artery and vein. Diagnostic catheterizatiion was done with 5 [place] LF4, FR4, and pigtail catheters. Swan-Ganz catheterization was done with pressure recording in the RA, RV, PA, and pulmonary capillary wedge positions. Cardiac output was obtained by the thermodilution and Fick methods. Oxygen saturations were obtained in the aorta and pulmonary artery. Left ventriculography was done with 25 cc Omnipaque contrast. Hemostasis was obtained by direct manual compression. The patient tolerated the procedure well.   HEMODYNAMICS:   RA: mean 13 RV: 46/12 PA: 46/16 mean 33 Pc: mean 22 v wave 34  Ao: 170/83 LV 170/24  Simultaneous LV 166/27 and RV 45/18  Cardiac output: 6.2 (thermodilution); 5.9 (Fick) Cardiac index:   2.4                            2.2     ANGIOGRAPHY:  1. Left main: Angiographically normal vessel which bifurcated into the LAD and left circumflex coronary artery 2. LAD: Widely  patent mid LAD stent. The LAD gave rise to 3 diagonal vessels. At the apex there was a previously noted 90% focal stenosis in a small caliber apical LAD vessel 3. Left circumflex: Gave rise to a proximal high marginal vessel. There was a stent in the proximal circumflex which was widely patent and another stent to the mid circumflex which also was widely patent. In between the 2 stented segments there was the previously noted subtotal small caliber marginal branch.  4. Right coronary artery: Widely patent proximal MID and distal stents. There is evidence for 70-80% stenosis in the small caliber continuation branch of the RCA after the PDA takeoff. This was present on the previous angiograms of March 2014.  Left ventriculography revealed moderate left ventricle hypertrophy with an ejection fraction of 55% without focal segmental wall motion abnormalities.  IMPRESSION:  Normal LV contractility with moderate left ventricular hypertrophy.  Previously noted multivessel coronary artery disease with evidence for widely patent mid LAD stent and a 90% apical LAD stenosis; widely patent proximal and mid left circumflex stents with previously noted subtotal occlusion of a small OM 2 vessel; and widely patent RCA ostial, mid, and distal stents with previously noted 70-80% stenosis in the continuation branch of the RCA.  Elevated right heart pressures with moderate pulmonary hypertension.  Systemic hypertension.  RECOMMENDATION:  Medical therapy.  Troy Sine, MD, [facility] 05/15/2013 12:17 AM

## 2013-05-15 NOTE — Progress Notes (Addendum)
Patient Name: Harold [name] Date of Encounter: 05/15/2013  Principal Problem:   DOE (dyspnea on exertion) Active Problems:   Diabetes mellitus, type 2 IDDM with neuropathy   Hypertension   Hyperlipidemia   Sleep apnea on C-pap   Length of Stay: 2  SUBJECTIVE  No CP, but mild residual SOB  CURRENT MEDS . aspirin EC  81 mg Oral Daily  . atenolol  12.5 mg Oral QPM  . cholecalciferol  1,000 Units Oral QID  . escitalopram  20 mg Oral Daily  . felodipine  5 mg Oral Daily  . furosemide  40 mg Oral Daily  . heparin  5,000 Units Subcutaneous 3 times per day  . insulin aspart  0-15 Units Subcutaneous TID WC  . insulin aspart  0-5 Units Subcutaneous QHS  . insulin aspart  50 Units Subcutaneous BID WC  . insulin NPH Human  75 Units Subcutaneous BID AC & HS  . levothyroxine  75 mcg Oral QAC breakfast  . lisinopril  10 mg Oral Daily  .  morphine injection  6 mg Intravenous Once  . multivitamin with minerals  1 tablet Oral Daily  . omega-3 acid ethyl esters  1 g Oral Daily  . pantoprazole  80 mg Oral Q1200  . sodium chloride  3 mL Intravenous Q12H  . Ticagrelor  90 mg Oral BID   OBJECTIVE  Filed Vitals:   05/14/13 1500 05/14/13 1828 05/14/13 1952 05/15/13 0809  BP:  188/94 145/69   Pulse: 56 61 64   Temp:   97.7 F (36.5 C)   TempSrc:   Oral   Resp:   18   Height:      Weight:    315 lb 14.7 oz (143.3 kg)  SpO2:   97%     Intake/Output Summary (Last 24 hours) at 05/15/13 1201 Last data filed at 05/15/13 0900  Gross per 24 hour  Intake 662.08 ml  Output      0 ml  Net 662.08 ml   Filed Weights   05/13/13 1143 05/13/13 2243 05/15/13 0809  Weight: 313 lb (141.976 kg) 317 lb 10.9 oz (144.1 kg) 315 lb 14.7 oz (143.3 kg)    PHYSICAL EXAM  General: Pleasant, NAD. Obese Neuro: Alert and oriented X 3. Moves all extremities spontaneously. Psych: Normal affect. HEENT:  Normal  Neck: Supple without bruits or JVD. Lungs:  Resp regular and unlabored, CTA. Heart: RRR  no s3, s4, or murmurs. Abdomen: Soft, non-tender, non-distended, BS + x 4.  Extremities: No clubbing, cyanosis or edema. DP/PT/Radials 1+ and equal bilaterally. Right groin: no bruit, no pain.   Accessory Clinical Findings  CBC  Recent Labs  05/13/13 1200  WBC 6.1  HGB 14.1  HCT 38.8*  MCV 86.6  PLT 546   Basic Metabolic Panel  Recent Labs  05/13/13 1200 05/14/13 0245  NA 139 139  K 4.7 4.0  CL 99 98  CO2 18* 24  GLUCOSE 75 197*  BUN 18 16  CREATININE 1.08 1.13  CALCIUM 9.9 9.4   Liver Function Tests  Recent Labs  05/14/13 0245  AST 42*  ALT 52  ALKPHOS 58  BILITOT 0.4  PROT 7.0  ALBUMIN 3.9   Cardiac Enzymes  Recent Labs  05/13/13 1200 05/13/13 1657 05/14/13 0002 05/14/13 0245 05/14/13 1104  CKTOTAL 459* 380*  --   --   --   CKMB  --  6.7*  --   --   --   TROPONINI  --   --  <  0.30 <0.30 <0.30   Recent Labs  05/14/13 0245  CHOL 262*  HDL 43  LDLCALC UNABLE TO CALCULATE IF TRIGLYCERIDE OVER 400 mg/dL  TRIG 569*  CHOLHDL 6.1   Thyroid Function Tests  Recent Labs  05/14/13 0002  TSH 1.395    Radiology/Studies  Dg Chest 2 View  05/13/2013   CLINICAL DATA:  Increased shortness of breath.  EXAM: CHEST  2 VIEW  COMPARISON:  DG CHEST 2 VIEW dated 04/23/2012  FINDINGS: The heart size and mediastinal contours are within normal limits. Both lungs are clear. The visualized skeletal structures are unremarkable.  IMPRESSION: No active cardiopulmonary disease.   Electronically Signed   By: Margaree Mackintosh M.D.   On: 05/13/2013 12:22    TELE: SR  ECG: NSR, normal ECG, unchanged from prior    ASSESSMENT AND PLAN  58 year old male with h/o CAD, s/p PCI to LAD in the past   1. Chest pain - cath showed only distal LAD disease, medical therapy was recommended. His cath showed elevated filling pressures and moderate pulmonary hypertension (RVSP 46 mmHg). We will increase Lasix to 40 mg po BID.  We would also recommend to increase felodipine to 10 mg  po daily for afterload reduction.  Repeat labs (BMP) and outpatient follow up in 1-2 weeks.  2. Hypertension - as above, moderate LVH with elevated LVEDP and RVSP, needs diuresis and afterload reduction  3. Hyperlipidemia - muscle pain and mildly elevated AST 1 month after stopping simvastatin. We will hold for now and refer to the lipid clinic.  4. OSA - on CPAP   5. Obesity - needs to loose weight  Discharge today.    Signed, Dorothy Spark MD, [facility] 05/15/2013

## 2013-05-15 NOTE — Discharge Instructions (Signed)
Call [facility] at [phone] if any bleeding, swelling or drainage at cath site.  May shower, no tub baths for 48 hours for groin sticks.   Heart Healthy diabetic diet.  You need to work on loosing weight.  We could have you see a dietician as outpatient.  We increased your lasix to twice a day (furosemide)  We increased BP med  Use your CPap  Call if any problems  Have lab work done end of coming week on first floor of Dr. Allison Quarry office.   Do NOT restart metformin until Monday, it may interact with cath dye.  Continue to hold statin- for cholesterol.  We will arrange for lipid clinic appt.

## 2013-05-15 NOTE — ED Provider Notes (Signed)
Medical screening examination/treatment/procedure(s) were conducted as a shared visit with non-physician practitioner(s) and myself.  I personally evaluated the patient during the encounter.   EKG Interpretation   Date/Time:  Thursday May 13 2013 11:36:43 EDT Ventricular Rate:  67 PR Interval:  148 QRS Duration: 92 QT Interval:  400 QTC Calculation: 422 R Axis:   49 Text Interpretation:  Normal sinus rhythm Normal ECG ED PHYSICIAN  INTERPRETATION AVAILABLE IN [facility] Confirmed by TEST, Record  ([id number]) on 05/15/2013 1:47:02 PM     Increasing chest pain and dyspnea on exertion with previous known coronary artery disease. Admit to cardiology.  Nat Christen, MD 05/15/13 1538

## 2013-05-15 NOTE — Discharge Summary (Signed)
Harold [name] 05/15/2013

## 2013-05-15 NOTE — Discharge Summary (Signed)
Physician Discharge Summary       Patient ID: Harold [name] MRN: [id number] DOB/AGE: [birth year] 58 y.o.  Admit date: 05/13/2013 Discharge date: 05/15/2013  Discharge Diagnoses:  Principal Problem:   DOE (dyspnea on exertion) Active Problems:   CAD, LAD/CFX DES 04/28/12- staged RCA DES 04/29/12 after pt declined CABG, cath 05/14/13 stable CAD medical therapy   Pulmonary hypertension, 17mhg   Chest pain at rest with SOB, elevated LVDP, increased lasix and CCB   Diabetes mellitus, type 2 IDDM with neuropathy   Hypertension   Hyperlipidemia   Sleep apnea on C-pap   Obesity (BMI 30-39.9)   Discharged Condition: good  Procedures: 05/14/13  Rt and Lt heart cath.   Hospital Course: 58y.o. male with a long history of CAD having undergone prior stenting to his LAD, proximal and mid AV groove circumflex with known subtotal occlusion and a diminutive marginal branch, and stenting to the RCA ostium, mid RCA and distal RCA.  He was seen in the office on 3/3 complaining of myalgias and felt to have myositis, possibly from a statin. His statin and fenofibrate were placed on hold. He was complaining of dyspnea on exertion at that time but this was felt related to his obesity, pulmonary hypertension and myalgias. He was not having any chest pressure or other symptoms that were felt to be cardiac in nature.  His shortness of breath has worsened since that time. He was unable to walk room to room without stopping because of shortness of breath. Additionally, he has had lower extremity edema, right greater than left but Dopplers in the past have been negative for DVT.  One time, he had sharp upper left chest pain that radiated into his left arm. It resolved without nitroglycerin.  He was admitted and ruled out for MI.  He underwent cardiac cath 05/14/13: RT heart cath HEMODYNAMICS:  RA: mean 13  RV: 46/12  PA: 46/16 mean 33  Pc: mean 22 v wave 34  Ao: 170/83  LV 170/24  Simultaneous LV 166/27 and RV  45/18  Cardiac output: 6.2 (thermodilution); 5.9 (Fick)  Cardiac index: 2.4 2.2  Angiography: 1. Left main: Angiographically normal vessel which bifurcated into the LAD and left circumflex coronary artery  2. LAD: Widely patent mid LAD stent. The LAD gave rise to 3 diagonal vessels. At the apex there was a previously noted 90% focal stenosis in a small caliber apical LAD vessel  3. Left circumflex: Gave rise to a proximal high marginal vessel. There was a stent in the proximal circumflex which was widely patent and another stent to the mid circumflex which also was widely patent. In between the 2 stented segments there was the previously noted subtotal small caliber marginal branch.  4. Right coronary artery: Widely patent proximal MID and distal stents. There is evidence for 70-80% stenosis in the small caliber continuation branch of the RCA after the PDA takeoff. This was present on the previous angiograms of March 2014.  Left ventriculography revealed moderate left ventricle hypertrophy with an ejection fraction of 55% without focal segmental wall motion abnormalities.   Moderate Pul HTN.   He did well post cath.  We increased his lasix to BID, increased CCB.  He will need to continue his C pap, continue to hold his statin due to elevated LFTs.  Chol 262.  He was instructed on importance of wt control.  He will hold metformin X 48 hours post cath to prevent interaction.  He was seen by Dr.  Nelson and found stable for discharge 05/15/13.  Will follow up in office.   Consults: None  Significant Diagnostic Studies:  BMET    Component Value Date/Time   NA 139 05/14/2013 0245   K 4.0 05/14/2013 0245   CL 98 05/14/2013 0245   CO2 24 05/14/2013 0245   GLUCOSE 197* 05/14/2013 0245   BUN 16 05/14/2013 0245   CREATININE 1.13 05/14/2013 0245   CREATININE 1.21 11/13/2011 1726   CALCIUM 9.4 05/14/2013 0245   GFRNONAA 69* 05/14/2013 0245   GFRAA 80* 05/14/2013 0245    CBC    Component Value Date/Time    WBC 6.1 05/13/2013 1200   WBC 6.4 11/13/2011 1728   RBC 4.48 05/13/2013 1200   RBC 4.86 11/13/2011 1728   HGB 14.1 05/13/2013 1200   HGB 14.7 11/13/2011 1728   HCT 38.8* 05/13/2013 1200   HCT 45.5 11/13/2011 1728   PLT 187 05/13/2013 1200   MCV 86.6 05/13/2013 1200   MCV 93.7 11/13/2011 1728   MCH 31.5 05/13/2013 1200   MCH 30.2 11/13/2011 1728   MCHC 36.3* 05/13/2013 1200   MCHC 32.3 11/13/2011 1728   RDW 13.7 05/13/2013 1200   LYMPHSABS 2.3 08/27/2007 1445   MONOABS 0.5 08/27/2007 1445   EOSABS 0.2 08/27/2007 1445   BASOSABS 0.0 08/27/2007 1445    AST 42   T chol 262, TG 569 HDL 43 Troponin <0.30 X 3   Pro BNP 98 TSH 1.395  CHEST 2 VIEW COMPARISON: DG CHEST 2 VIEW dated 04/23/2012 FINDINGS: The heart size and mediastinal contours are within normal limits. Both lungs are clear. The visualized skeletal structures are unremarkable. IMPRESSION: No active cardiopulmonary disease.    Discharge Exam: Blood pressure 145/69, pulse 64, temperature 97.7 F (36.5 C), temperature source Oral, resp. rate 18, height _0  (1.854 m), weight 315 lb 14.7 oz (143.3 kg), SpO2 97.00%.   Disposition: 01-Home or Self Care       Future Appointments Provider Department Dept Phone   06/01/2013 1:45 PM [name], MD [facility] [phone]   06/24/2013 9:15 AM Leonie Man, MD [facility] [phone]   02/24/2014 10:00 AM [facility] [phone]   02/24/2014 11:00 AM Sharmon Leyden Nickel, NP [facility] [phone]       Medication List    STOP taking these medications       simvastatin 40 MG tablet  Commonly known as:  ZOCOR      TAKE these medications       aspirin 81 MG EC tablet  Take 1 tablet (81 mg total) by mouth daily.     atenolol 25 MG tablet  Commonly known as:  TENORMIN  Take 0.5 tablets (12.5 mg total) by mouth every evening.     BRILINTA 90 MG Tabs tablet  Generic drug:  Ticagrelor    TAKE 1 TABLET (90 MG TOTAL) BY MOUTH 2 (TWO) TIMES DAILY.     cholecalciferol 1000 UNITS tablet  Commonly known as:  VITAMIN D  Take 1,000 Units by mouth 4 (four) times daily.     escitalopram 20 MG tablet  Commonly known as:  LEXAPRO  Take 1 tablet (20 mg total) by mouth daily. NEED VISIT!!     esomeprazole 40 MG capsule  Commonly known as:  NEXIUM  Take 1 capsule (40 mg total) by mouth daily.     felodipine 10 MG 24 hr tablet  Commonly known  as:  PLENDIL  Take 1 tablet (10 mg total) by mouth daily.  Start taking on:  05/16/2013     fenofibrate micronized 134 MG capsule  Commonly known as:  LOFIBRA  TAKE 1 CAPSULE (134 MG TOTAL) BY MOUTH DAILY BEFORE BREAKFAST.     fish oil-omega-3 fatty acids 1000 MG capsule  Take 1,200 mg by mouth daily.     furosemide 40 MG tablet  Commonly known as:  LASIX  Take 1 tablet (40 mg total) by mouth 2 (two) times daily.     Garlic 175 MG Caps  Take 500 mg by mouth daily.     insulin aspart 100 UNIT/ML injection  Commonly known as:  novoLOG  Inject 50 Units into the skin 2 (two) times daily.     insulin NPH Human 100 UNIT/ML injection  Commonly known as:  HUMULIN N,NOVOLIN N  Inject 75 Units into the skin 2 (two) times daily before a meal.     levothyroxine 75 MCG tablet  Commonly known as:  SYNTHROID, LEVOTHROID  TAKE 1 TABLET BY MOUTH ONCE A DAY     lisinopril 20 MG tablet  Commonly known as:  PRINIVIL,ZESTRIL  Take 0.5 tablets (10 mg total) by mouth daily.     LUTEIN 20 PO  Take 20 mg by mouth daily.     metFORMIN 1000 MG tablet  Commonly known as:  GLUCOPHAGE  Take 1 tablet (1,000 mg total) by mouth 2 (two) times daily with a meal.  Start taking on:  05/17/2013     multivitamin with minerals tablet  Take 1 tablet by mouth daily.     nitroGLYCERIN 0.4 MG SL tablet  Commonly known as:  NITROSTAT  Place 1 tablet (0.4 mg total) under the tongue every 5 (five) minutes as needed for chest pain (severe chest pressure or  tightness).     Potassium 99 MG Tabs  Take 99 mg by mouth daily.     TURMERIC PO  Take 800 mg by mouth daily.       Follow-up Information   Follow up with Leonie Man, MD. (the office will call Monday with date and time.)    Specialty:  Cardiology   Contact information:   [address] [place] [address] [phone]        Discharge Instructions: Call [place] at [phone] if any bleeding, swelling or drainage at cath site.  May shower, no tub baths for 48 hours for groin sticks.   Heart Healthy diabetic diet.  You need to work on loosing weight.  We could have you see a dietician as outpatient.  We increased your lasix to twice a day (furosemide)  We increased BP med  Use your CPap  Call if any problems  Have lab work done end of coming week on first floor of Dr. Allison Quarry office.   Do NOT restart metformin until Monday, it may interact with cath dye. Continue to hold statin- for cholesterol.  We will arrange for lipid clinic appt.  Signed: Isaiah Serge Nurse Practitioner-Certified [facility]: [facility]ART[facility] 05/15/2013, 1:20 PM  Time spent on discharge : 35 minutes.

## 2013-05-17 LAB — VITAMIN D 1,25 DIHYDROXY
Vitamin D 1, 25 (OH)2 Total: 43 pg/mL (ref 18–72)
Vitamin D2 1, 25 (OH)2: <8 pg/mL
Vitamin D3 1, 25 (OH)2: 43 pg/mL

## 2013-05-20 ENCOUNTER — Ambulatory Visit (INDEPENDENT_AMBULATORY_CARE_PROVIDER_SITE_OTHER): Payer: PRIVATE HEALTH INSURANCE | Admitting: Family Medicine

## 2013-05-20 VITALS — BP 126/70 | HR 70 | Temp 97.6°F | Resp 20 | Ht 73.5 in | Wt 309.6 lb

## 2013-05-20 DIAGNOSIS — R059 Cough, unspecified: Secondary | ICD-10-CM

## 2013-05-20 DIAGNOSIS — R05 Cough: Secondary | ICD-10-CM

## 2013-05-20 DIAGNOSIS — J069 Acute upper respiratory infection, unspecified: Secondary | ICD-10-CM

## 2013-05-20 MED ORDER — HYDROCODONE-HOMATROPINE 5-1.5 MG/5ML PO SYRP: 5.0000 mL | ORAL_SOLUTION | ORAL | Status: DC | PRN

## 2013-05-20 MED ORDER — AZITHROMYCIN 250 MG PO TABS: ORAL_TABLET | ORAL | Status: DC

## 2013-05-20 NOTE — Patient Instructions (Signed)
Drink plenty of fluids and get enough rest  Return if in all worse  Take the azithromycin 2 pills initially, then one daily for 4 days  Use the cough syrup 1 teaspoon every 4-6 hours as needed for cough. It may make you drowsy, so is best used at nighttime.

## 2013-05-20 NOTE — Progress Notes (Signed)
Subjective: Patient was in the hospital late last week with chest pain and evaluation for a possible heart attack. The cardiac catheterization was good. He came home from the hospital and on Monday develop a respiratory tract infection with a lot of head congestion and coughing up purulent phlegm. He has been short of breath. He does not smoke, having quit a number of years ago. He is allergic to penicillins and sulfas. The vital signs were stable and oxygen level good. He is scheduled to see Dr. Osvaldo Angst in a couple of weeks. He has been working harder on his diabetes, and the sugars have been running under 200 last few days.  Objective: TMs are normal. Throat clear. Neck supple without significant nodes. Chest is clear to percussion and auscultation. Heart regular without murmurs.  Assessment: Upper respiratory infection Cough  Plan: Probably a bad viral infection, but says he just came out of the hospital with having had procedural studies I am going ahead and treating him with a round of antibiotics. I do not believe chest x-ray is needed at this time, but if he gets worse with increased shortness of breath, range fevers, or generally worse he should come back in.Marland Kitchen

## 2013-05-24 ENCOUNTER — Other Ambulatory Visit (HOSPITAL_COMMUNITY): Payer: Self-pay

## 2013-05-24 DIAGNOSIS — R0600 Dyspnea, unspecified: Secondary | ICD-10-CM

## 2013-05-24 LAB — PULMONARY FUNCTION TEST
DL/VA % pred: 95 %
DL/VA: 4.57 ml/min/mmHg/L
DLCO COR % PRED: 75 %
DLCO cor: 27.56 ml/min/mmHg
DLCO unc % pred: 74 %
DLCO unc: 27.17 ml/min/mmHg
FEF 25-75 Post: 2.07 L/sec
FEF 25-75 Pre: 1.63 L/sec
FEF2575-%CHANGE-POST: 27 %
FEF2575-%PRED-PRE: 49 %
FEF2575-%Pred-Post: 63 %
FEV1-%CHANGE-POST: 6 %
FEV1-%PRED-POST: 63 %
FEV1-%PRED-PRE: 59 %
FEV1-POST: 2.54 L
FEV1-Pre: 2.38 L
FEV1FVC-%CHANGE-POST: -1 %
FEV1FVC-%Pred-Pre: 95 %
FEV6-%CHANGE-POST: 9 %
FEV6-%PRED-PRE: 64 %
FEV6-%Pred-Post: 70 %
FEV6-PRE: 3.25 L
FEV6-Post: 3.56 L
FEV6FVC-%Change-Post: 0 %
FEV6FVC-%Pred-Post: 104 %
FEV6FVC-%Pred-Pre: 103 %
FVC-%Change-Post: 8 %
FVC-%Pred-Post: 67 %
FVC-%Pred-Pre: 61 %
FVC-PRE: 3.28 L
FVC-Post: 3.56 L
POST FEV1/FVC RATIO: 71 %
POST FEV6/FVC RATIO: 100 %
PRE FEV6/FVC RATIO: 99 %
Pre FEV1/FVC ratio: 73 %
RV % PRED: 134 %
RV: 3.23 L
TLC % PRED: 92 %
TLC: 6.99 L

## 2013-05-27 ENCOUNTER — Ambulatory Visit (HOSPITAL_COMMUNITY)
Admission: RE | Admit: 2013-05-27 | Discharge: 2013-05-27 | Disposition: A | Discharge status: Home / Self Care | Payer: No Typology Code available for payment source | Source: Ambulatory Visit | Attending: Rheumatology | Admitting: Rheumatology

## 2013-05-27 DIAGNOSIS — R0609 Other forms of dyspnea: Secondary | ICD-10-CM | POA: Diagnosis not present

## 2013-05-27 DIAGNOSIS — R0989 Other specified symptoms and signs involving the circulatory and respiratory systems: Principal | ICD-10-CM | POA: Insufficient documentation

## 2013-05-27 MED ORDER — ALBUTEROL SULFATE (2.5 MG/3ML) 0.083% IN NEBU
2.5000 mg | INHALATION_SOLUTION | Freq: Once | RESPIRATORY_TRACT | Status: AC
  Administered 2013-05-27: 2.5 mg via RESPIRATORY_TRACT

## 2013-06-01 ENCOUNTER — Ambulatory Visit (INDEPENDENT_AMBULATORY_CARE_PROVIDER_SITE_OTHER): Payer: PRIVATE HEALTH INSURANCE | Admitting: Emergency Medicine

## 2013-06-01 ENCOUNTER — Ambulatory Visit (INDEPENDENT_AMBULATORY_CARE_PROVIDER_SITE_OTHER): Payer: PRIVATE HEALTH INSURANCE | Admitting: Cardiology

## 2013-06-01 ENCOUNTER — Encounter: Payer: Self-pay | Admitting: Emergency Medicine

## 2013-06-01 ENCOUNTER — Encounter: Payer: Self-pay | Admitting: Cardiology

## 2013-06-01 VITALS — BP 138/68 | HR 60 | Ht 73.0 in | Wt 311.3 lb

## 2013-06-01 VITALS — BP 120/60 | HR 67 | Temp 97.9°F | Resp 18 | Ht 73.0 in | Wt 312.6 lb

## 2013-06-01 DIAGNOSIS — Z23 Encounter for immunization: Secondary | ICD-10-CM

## 2013-06-01 DIAGNOSIS — I2789 Other specified pulmonary heart diseases: Secondary | ICD-10-CM

## 2013-06-01 DIAGNOSIS — R0602 Shortness of breath: Secondary | ICD-10-CM

## 2013-06-01 DIAGNOSIS — Z0000 Encounter for general adult medical examination without abnormal findings: Secondary | ICD-10-CM

## 2013-06-01 DIAGNOSIS — I272 Pulmonary hypertension, unspecified: Secondary | ICD-10-CM

## 2013-06-01 DIAGNOSIS — I25810 Atherosclerosis of coronary artery bypass graft(s) without angina pectoris: Secondary | ICD-10-CM

## 2013-06-01 LAB — POCT URINALYSIS DIPSTICK
Bilirubin, UA: NEGATIVE
Blood, UA: NEGATIVE
GLUCOSE UA: NEGATIVE
KETONES UA: NEGATIVE
Nitrite, UA: NEGATIVE
PROTEIN UA: 30
Spec Grav, UA: 1.02
Urobilinogen, UA: 0.2
pH, UA: 5

## 2013-06-01 LAB — IFOBT (OCCULT BLOOD): IFOBT: NEGATIVE

## 2013-06-01 MED ORDER — ZOSTER VACCINE LIVE 19400 UNT/0.65ML ~~LOC~~ SOLR: 0.6500 mL | Freq: Once | SUBCUTANEOUS | Status: DC

## 2013-06-01 NOTE — Progress Notes (Signed)
Subjective:    Patient ID: Harold [name], male    DOB: 1956/02/02, 58 y.o.   MRN: [id number]  HPI This chart was scribed for Harold Russian, MD by Ladene Artist, ED Scribe. The patient was seen in room 22. Patient's care was started at 1:49 PM.  HPI Comments: [name] is a 58 y.o. male, with a history of DM and HTN, who presents to the Emergency Department for CPE.   Pt states that he is "good in some ways and bad in others". He reports that he is still working 40-55 hours per week. He reports sitting for most of the day at work. Pt also shares that he just got married last week. They are hoping to go to [facility] for their honeymoon.   Pt went to the hospital and was admitted in March 26 due to SOB. He states that he experiences this even after walking short distances such as 10 feet. He further reports that he "felt like his lungs were full" and thought he was having a heart attack. Test results showed no sign of heart attack. Dr. Ellyn Hack is his cardiologist.  Tuesday after being discharged, pt experienced cold-like symptoms. He went to [facility] Thursday and was given medication that he has taken with no relief. Pt reports weakness in his legs below the knees and overall fatigue. He states that he feels as if his legs are going to collapse while he is walking. He also reports a constant, gradually worsening cough and coughing up mucous onset November. He denies fever.   Pt was referred to Dr. Ouida Sills who suspects COPD. Pt has an appointment to see a pulmonary specialist on April 30.   Pt states that he has lost approximately 5 lbs and he is trying to lose more. Pt states that his Triglyceride levels are coming down. He also reports using Cpap every night that he describes as wonderful.   Pt saw an optometrist in January. Pt has not had a shingles vaccine. Influenza, pneumonia and tetanus are UTD.    Past Medical History  Diagnosis Date  . Diabetes mellitus   . Carotid artery occlusion    . Complication of anesthesia   . PONV (postoperative nausea and vomiting)     "[place]"  . Hypothyroidism   . Depression     situaltional   . Sleep apnea   . Neuropathy     notably improved following PCI with improved cardiac function  . CAD, multiple vessel 2003, 04/2012    status post PCI to LAD, circumflex-OM 2, RCA  . 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 Dyslipidemia associated with type 2 diabetes mellitus   . GERD (gastroesophageal reflux disease)   . Chronic renal insufficiency, stage II (mild)   . Pulmonary hypertension 04/2012    PA pressure 26mhg  . HTN (hypertension)   . Chronic venous insufficiency     varicosities, no reflux; dopplers 04/14/12- valvular insufficiency in the R and L GSV  . CAD S/P percutaneous coronary angioplasty     see cath and PCI and post surgical history  . Anginal pain   . Shortness of breath    Past Surgical History  Procedure Laterality Date  . Umbilical hernia repair  2009    steel mesh insert  . Coronary angioplasty  12/21/2001    PTCA of the distal and mid AV  groove circ, unsuccessful PTCA of second OM total occlusion, unsuccessful PTCA of the apical LAD total occlusion  . Colonscopy    . Carotid endarterectomy  2005    Right; recent carotid Dopplers notes elevated velocities.   . Back surgery  2005 x1    X2-2010  . Lumbar laminectomy/decompression microdiscectomy  01/07/2012    Procedure: LUMBAR LAMINECTOMY/DECOMPRESSION MICRODISCECTOMY 1 LEVEL;  Surgeon: Winfield Cunas, MD;  Location: [facility];  Service: Neurosurgery;  Laterality: Bilateral;   Lumbar Three-Four Decompression  . Coronary stent placement  04/28/12    PCI to LAD (3x14m Xience DES postdilated to 3.25) and circ prox and mid (2 overlappinmg 2.523m12mmXience DES postdilated to 2.7523m . Cardiac catheterization  12/11/2001    significant 3V CAD, normal LV function  . Coronary  angioplasty with stent placement  04/29/12    PCI to 3 RCA lesions, Promus Premiere 2.32m75mm distally, mid was 2.5mx249m and proximally 2.75x16mm,76m55-60%  . Doppler echocardiography  04/28/2012    poor quality study: EF estimated 60-65%; unable to assess diastolic function (previously noted to have diastolic dysfunction); severely dilated left atrium and mild right atrium; dilated IVC consistent with increased central venous pressure..  . Carotid doppler  04/27/2012    right internal carotid: Elevated velocities but no evidence of plaque. Left internal carotid 40-59%  . Abis  04/27/2012    mild bilateral arterial insufficiency   Allergies  Allergen Reactions  . Septra [Bactrim] Itching  . Penicillins Hives    Allergy to All cillin drugs  . Other     Walnuts, pecans      Review of Systems  Constitutional: Positive for fatigue. Negative for fever.  Respiratory: Positive for cough and shortness of breath.   Musculoskeletal: Positive for myalgias.       Objective:   Physical Exam CONSTITUTIONAL: Well developed/well nourished HEAD: Normocephalic/atraumatic EYES: EOMI/PERRL ENMT: Mucous membranes moist NECK: supple no meningeal signsthere is a surgical scar over the neck the SPINE:entire spine nontender CV: S1/S2 noted, no murmurs/rubs/gallops noted LUNGS: Lungs are clear to auscultation bilaterally, no apparent distress ABDOMEN: soft, nontender, no rebound or guarding GU:no cva tenderness NEURO: Pt is awake/alert, moves all extremitiesx4 EXTREMITIES: He has some soreness in both calves and varicosities present bilaterally. SKIN: warm, color normal PSYCH: no abnormalities of mood noted Results for orders placed in visit on 06/01/13  POCT URINALYSIS DIPSTICK      Result Value Ref Range   Color, UA yellow     Clarity, UA clear     Glucose, UA neg     Bilirubin, UA neg     Ketones, UA neg     Spec Grav, UA 1.020     Blood, UA neg     pH, UA 5.0     Protein, UA 30      Urobilinogen, UA 0.2     Nitrite, UA neg     Leukocytes, UA Trace    IFOBT (OCCULT BLOOD)      Result Value Ref Range   IFOBT Negative        Assessment & Plan:  This patient has severe diabetes with very aggressive atherosclerotic disease. He has recently had significant trouble with his lower extremities. He is has significant shortness of breath with exertion. He is scheduled to see a pulmonary specialist for this. He also is scheduled to see a rheumatologist to evaluate his lower extremities and this referral was done by Dr. Balan.Chalmers Caternder if he could have some  type of neuromuscular disorder associated with his recent problems with shortness of breath. Certainly his obesity he is making his symptoms worse. I told him that if an answer was not obtained with the above referrals we would consider neurology evaluation . I personally performed the services described in this documentation, which was scribed in my presence. The recorded information has been reviewed and is accurate.

## 2013-06-01 NOTE — Patient Instructions (Signed)
Your physician recommends that you keep your scheduled appointment on May 7th 2015 with Dr.Harding

## 2013-06-03 NOTE — Progress Notes (Signed)
Patient ID: Harold [name], male   DOB: 1956/02/10, 58 y.o.   MRN: [id number]    06/06/2013 Harold [name]   07-28-1955  [id number]  Primary Physicia [name], MD Primary Cardiologist: Dr. Ellyn Hack  HPI:  [name] is a 58 year old male who has established coronary artery disease and has undergone prior stenting to his LAD, proximal and mid AV groove circumflex with known subtotal occlusion and a diminutive marginal branch, and stenting to the RCA ostium, mid RCA and distal RCA. He also has a past h/o chronic tobacco abuse, as well as HTN, HLD OSA and DM.  He was admitted to [facility] on 05/13/13 for chest pain and SOB. He ruled out for MI. BNP was normal, ruling out CHF. He underwent a LHC, performed by Dr. Claiborne Billings. He was found to have a widely patent mid LAD stent and a 90% apical LAD stenosis; widely patent proximal and mid left circumflex stents with previously noted subtotal occlusion of a small OM 2 vessel; and widely patent RCA ostial, mid, and distal stents with previously noted 70-80% stenosis in the continuation branch of the RCA. LV function was normal.  He was also noted to have elevated right heart pressures with moderate pulmonary hypertension. It was felt that this was the likely cause for his symptoms. Continued medical therapy was recommended. His Lasix was increased to 40 mg BID. He was instructed to continue using his CPAP for his OSA. Outpatient f/u was arranged with pulmonology. He underwent pulmonary function test. Preliminary readings suggest he has emphysema. He is scheduled to f/u with Dr. Halford Chessman at the end of the month for assessment.   Since discharge, he denies any further recurrence of chest pain, but continues to note DOE. He reports daily compliance with his medications and CPAP.    Current Outpatient Prescriptions  Medication Sig Dispense Refill  . aspirin EC 81 MG EC tablet Take 1 tablet (81 mg total) by mouth daily.      Marland Kitchen atenolol (TENORMIN) 25 MG tablet Take 0.5  tablets (12.5 mg total) by mouth every evening.  30 tablet  11  . BRILINTA 90 MG TABS tablet TAKE 1 TABLET (90 MG TOTAL) BY MOUTH 2 (TWO) TIMES DAILY.  60 tablet  11  . cholecalciferol (VITAMIN D) 1000 UNITS tablet Take 1,000 Units by mouth 4 (four) times daily.       Marland Kitchen escitalopram (LEXAPRO) 20 MG tablet Take 1 tablet (20 mg total) by mouth daily. NEED VISIT!!  30 tablet  2  . esomeprazole (NEXIUM) 40 MG capsule Take 1 capsule (40 mg total) by mouth daily.  30 capsule  11  . felodipine (PLENDIL) 10 MG 24 hr tablet Take 1 tablet (10 mg total) by mouth daily.  30 tablet  6  . fish oil-omega-3 fatty acids 1000 MG capsule Take 1,200 mg by mouth daily.       . furosemide (LASIX) 40 MG tablet Take 1 tablet (40 mg total) by mouth 2 (two) times daily.  60 tablet  6  . Garlic 267 MG CAPS Take 500 mg by mouth daily.      . insulin aspart (NOVOLOG) 100 UNIT/ML injection Inject 50 Units into the skin 2 (two) times daily.       . insulin NPH (HUMULIN N,NOVOLIN N) 100 UNIT/ML injection Inject 75 Units into the skin 2 (two) times daily before a meal.       . levothyroxine (SYNTHROID, LEVOTHROID) 75 MCG tablet TAKE 1 TABLET BY  MOUTH ONCE A DAY  90 tablet  0  . lisinopril (PRINIVIL,ZESTRIL) 20 MG tablet Take 0.5 tablets (10 mg total) by mouth daily.  30 tablet  11  . metFORMIN (GLUCOPHAGE) 1000 MG tablet Take 1 tablet (1,000 mg total) by mouth 2 (two) times daily with a meal.      . Misc Natural Products (LUTEIN 20 PO) Take 20 mg by mouth daily.      . Multiple Vitamins-Minerals (MULTIVITAMIN WITH MINERALS) tablet Take 1 tablet by mouth daily.      . nitroGLYCERIN (NITROSTAT) 0.4 MG SL tablet Place 1 tablet (0.4 mg total) under the tongue every 5 (five) minutes as needed for chest pain (severe chest pressure or tightness).  25 tablet  2  . Potassium 99 MG TABS Take 99 mg by mouth daily.      . TURMERIC PO Take 800 mg by mouth daily.      Marland Kitchen zoster vaccine live, PF, (ZOSTAVAX) 74128 UNT/0.65ML injection Inject  19,400 Units into the skin once.  1 each  0  . [DISCONTINUED] insulin lispro (HUMALOG) 100 UNIT/ML injection Inject 25 Units into the skin 3 (three) times daily before meals.       No current facility-administered medications for this visit.    Allergies  Allergen Reactions  . Septra [Bactrim] Itching  . Penicillins Hives    Allergy to All cillin drugs  . Other     Walnuts, pecans     History   Social History  . Marital Status: Single    Spouse Name: N/A    Number of Children: N/A  . Years of Education: N/A   Occupational History  . [employer]    Social History Main Topics  . Smoking status: Former Smoker -- 26 years    Quit date: 03/23/2003  . Smokeless tobacco: Never Used  . Alcohol Use: No  . Drug Use: No  . Sexual Activity: Not on file   Other Topics Concern  . Not on file   Social History Narrative   He is a single gentleman a long-term monogamous same sex relationship , who is also a patient of our clinic. He is no longer undergoing cardiac rehabilitation, but is doing routine exercise.   He works at [employer].   He does not smoke, he quit in 2009.  He does not drink alcohol.   He was adopted and no family history is known.      Review of Systems: General: negative for chills, fever, night sweats or weight changes.  Cardiovascular: negative for chest pain, dyspnea on exertion, edema, orthopnea, palpitations, paroxysmal nocturnal dyspnea or shortness of breath Dermatological: negative for rash Respiratory: negative for cough or wheezing Urologic: negative for hematuria Abdominal: negative for nausea, vomiting, diarrhea, bright red blood per rectum, melena, or hematemesis Neurologic: negative for visual changes, syncope, or dizziness All other systems reviewed and are otherwise negative except as noted above.    Blood pressure 138/68, pulse 60, height _0  (1.854 m), weight 311 lb 4.8 oz (141.205 kg).  General appearance: alert,  cooperative and no distress Neck: no carotid bruit and no JVD Lungs: faint bilateral expiratory wheezing  Heart: regular rate and rhythm, S1, S2 normal, no murmur, click, rub or gallop Extremities: no LEE Pulses: 2+ and symmetric Skin: warm and dry Neurologic: Grossly normal    ASSESSMENT AND PLAN:   Pulmonary Hypertension: Continue CPAP. Weight loss encouraged. PFTs seem consistent with restrictive lung disease. He is scheduled to f/u with  Dr. Halford Chessman in 2  Weeks.   CAD: Stable by cath with patent stents. Denies chest pain. Continue medial therapy.   HTN: Well controlled. Continue current meds.  HLD: Now has fenofibrate and statin on hold, secondary to myalgia and myositis. LFTs also were still slightly elevated during recent admission. Pt encouraged to monitor diet.   DM: PCP managing. Recent Hgb A1c was much improved at 7.6. Goal is <7.0  OSA: continue nightly CPAP therapy  Obesity: Pt wants to engage in regular exercise, however this has been limited due to severe DOE.    PLAN  Stable from a cardiac standpoint. Continue current meds and regular CPAP therapy for OSA. Plan to have patient f/u with Dr. Halford Chessman in 2 weeks for further management of pulmonary HTN. F/u with Dr. Ellyn Hack in 3-4 weeks.   Brittainy SimmonsPA-C 06/06/2013 10:27 PM

## 2013-06-07 ENCOUNTER — Ambulatory Visit (INDEPENDENT_AMBULATORY_CARE_PROVIDER_SITE_OTHER): Payer: PRIVATE HEALTH INSURANCE | Admitting: Neurology

## 2013-06-07 ENCOUNTER — Encounter (INDEPENDENT_AMBULATORY_CARE_PROVIDER_SITE_OTHER): Payer: Self-pay | Admitting: Radiology

## 2013-06-07 DIAGNOSIS — G544 Lumbosacral root disorders, not elsewhere classified: Secondary | ICD-10-CM

## 2013-06-07 DIAGNOSIS — E1142 Type 2 diabetes mellitus with diabetic polyneuropathy: Secondary | ICD-10-CM

## 2013-06-07 DIAGNOSIS — Z0289 Encounter for other administrative examinations: Secondary | ICD-10-CM

## 2013-06-07 NOTE — Procedures (Signed)
HISTORY:  Harold [name] is a 58 year old gentleman with a history of obesity, and diabetes. The patient has a history of a prior lumbosacral spine procedure. The patient has had some issues with feeling fatigued in the legs below the knees since January 2015. The patient has been noted to have a modest elevation in CK enzyme elevations in the 300 range. The patient has been sent to this office for further evaluation.  NERVE CONDUCTION STUDIES:  Nerve conduction studies were performed on the left upper extremity. The distal motor latencies and motor amplitudes for the median and ulnar nerves were within normal limits. The F wave latencies and nerve conduction velocities for these nerves were also normal. The sensory latencies for the median, radial, and ulnar nerves were normal.  Nerve conduction studies were performed on both lower extremities. Studies of the peroneal and posterior tibial nerves were unobtainable for the peroneal nerves bilaterally, with normal distal motor latencies for the posterior tibial nerves bilaterally with low motor amplitudes for these nerves bilaterally. No response was seen following stimulation at the popliteal fossa on either side for the posterior tibial nerves. The peroneal sensory latencies were unobtainable bilaterally.  EMG STUDIES:  EMG study was performed on the left upper extremity:  The first dorsal interosseous muscle reveals 2 to 4 K units with full recruitment. No fibrillations or positive waves were noted. The abductor pollicis brevis muscle reveals 2 to 5 K units with slight decrease in recruitment. No fibrillations or positive waves were noted. The extensor indicis proprius muscle reveals 1 to 3 K units with full recruitment. No fibrillations or positive waves were noted. The biceps muscle reveals 1 to 2 K units with full recruitment. No fibrillations or positive waves were noted. The triceps muscle reveals 2 to 4 K units with full recruitment. No  fibrillations or positive waves were noted. The anterior deltoid muscle reveals 2 to 3 K units with full recruitment. No fibrillations or positive waves were noted. The cervical paraspinal muscles were tested at 2 levels. No abnormalities of insertional activity were seen at either level tested. There was good relaxation.  EMG study was performed on the left lower extremity:  The tibialis anterior muscle reveals 2 to 4K motor units with slight decrease in recruitment. One plus fibrillations and positive waves were seen. The peroneus tertius muscle reveals 2 to 4K motor units with slight decrease in recruitment. 2+ fibrillations and positive waves were seen. The medial gastrocnemius muscle reveals 1 to 3K motor units with slight decrease in recruitment. 2+ fibrillations and positive waves were seen. Occasional fasciculations were seen. The vastus lateralis muscle reveals 2 to 4K motor units with full recruitment. No fibrillations or positive waves were seen. The iliopsoas muscle reveals 2 to 4K motor units with full recruitment. No fibrillations or positive waves were seen. The biceps femoris muscle (long head) reveals 2 to 5K motor units with decreased recruitment. No fibrillations or positive waves were seen. The lumbosacral paraspinal muscles were tested at 3 levels, and revealed no abnormalities of insertional activity at the upper level tested. One plus fibrillations and positive waves were seen at the middle level, 2+ fibrillations and positive waves were seen at the lower level. There was good relaxation.   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 EMG evaluation of the left lower extremity reveals primarily distal denervation consistent with the peripheral neuropathy. Cannot  exclude an overlying L5 and or S1 radiculopathy as well. EMG of the left upper  extremity was relatively unremarkable, without evidence of an overlying cervical radiculopathy. No evidence of a myopathic disease process is seen. Elevation in CK enzyme levels are likely related to the active neuropathic denervation as described above.   Jill Alexanders MD 06/07/2013 1:59 PM  [facility] [address] [place],  [id number]  Phone [phone] Fax [phone]

## 2013-06-09 ENCOUNTER — Other Ambulatory Visit: Payer: Self-pay | Admitting: Emergency Medicine

## 2013-06-17 ENCOUNTER — Encounter: Payer: Self-pay | Admitting: Pulmonary Disease

## 2013-06-17 ENCOUNTER — Ambulatory Visit (INDEPENDENT_AMBULATORY_CARE_PROVIDER_SITE_OTHER): Payer: PRIVATE HEALTH INSURANCE | Admitting: Pulmonary Disease

## 2013-06-17 ENCOUNTER — Telehealth: Payer: Self-pay | Admitting: Neurology

## 2013-06-17 VITALS — BP 140/70 | HR 60 | Ht 73.0 in | Wt 317.0 lb

## 2013-06-17 DIAGNOSIS — R0989 Other specified symptoms and signs involving the circulatory and respiratory systems: Secondary | ICD-10-CM

## 2013-06-17 DIAGNOSIS — G4733 Obstructive sleep apnea (adult) (pediatric): Secondary | ICD-10-CM

## 2013-06-17 DIAGNOSIS — I272 Pulmonary hypertension, unspecified: Secondary | ICD-10-CM

## 2013-06-17 DIAGNOSIS — J984 Other disorders of lung: Secondary | ICD-10-CM

## 2013-06-17 DIAGNOSIS — G4730 Sleep apnea, unspecified: Secondary | ICD-10-CM

## 2013-06-17 DIAGNOSIS — R0600 Dyspnea, unspecified: Secondary | ICD-10-CM

## 2013-06-17 DIAGNOSIS — R0609 Other forms of dyspnea: Secondary | ICD-10-CM

## 2013-06-17 DIAGNOSIS — J449 Chronic obstructive pulmonary disease, unspecified: Secondary | ICD-10-CM | POA: Insufficient documentation

## 2013-06-17 DIAGNOSIS — I2789 Other specified pulmonary heart diseases: Secondary | ICD-10-CM

## 2013-06-17 MED ORDER — ALBUTEROL SULFATE HFA 108 (90 BASE) MCG/ACT IN AERS: 2.0000 | INHALATION_SPRAY | Freq: Four times a day (QID) | RESPIRATORY_TRACT | Status: DC | PRN

## 2013-06-17 MED ORDER — TIOTROPIUM BROMIDE MONOHYDRATE 18 MCG IN CAPS: 18.0000 ug | ORAL_CAPSULE | Freq: Every day | RESPIRATORY_TRACT | Status: DC

## 2013-06-17 NOTE — Assessment & Plan Note (Signed)
He is followed by Dr. Brett Fairy for this.

## 2013-06-17 NOTE — Progress Notes (Addendum)
Chief Complaint  Patient presents with  . Pulmonary Consult    referred by Dr. Ouida Sills for abnormal PFT    History of Present Illness: Harold [name] is a 58 y.o. male former smoker for evaluation of dyspnea.  He has notice more trouble with his breathing recently.  This has been getting progressively worse.  He notices this most with walking, bending, stooping, or take hot showers.  He noticed trouble with leg weakness.  He was evaluated by Dr. Ouida Sills with rheumatology, and was told this not related to any type of connective tissue disease (ie SLE, RA, etc).  He was then referred for breathing test.  He was also evaluated by neurology, and told he has neuropathy in his legs.  He does not have cough or sputum unless he has a cold.  He will sometimes get wheeze in his throat. He denies history of allergies, or globus sensation.  He gets winded after walking about 20 feet >> leg pain and dyspnea limit his activity.  He feel like his heart races when he does anything.  There is not history of asthma or COPD before.  He denies history of thrombo-embolic disease.  He has never been on inhalers before.  He denies history of anemia.  He has history of sleep apnea and is followed by Dr. Dyanne Iha for this.  He reports history of GERD, and takes daily nexium.  He denies difficulty with his swallowing.  He started smoking at age 5.  He smoked up to 1 pack per day, and quit in 2005.  He had pneumonia in 1994, and 2004.  There is no history of exposure to tuberculosis.  He worked in [employer].  He is from [place].  He denies history of animal exposure.   Tests: Echo 04/28/12 >> EF 60%, severe LA dilation, PAS 53 mmHg CXR 05/13/13 >> no active disease PFT 05/27/13 >> FEV1 2.54 (63%), FEV1% 71, TLC 6.99 (92%), DLCO 74%, no BD  Harold [name]  has a past medical history of Diabetes mellitus; Carotid artery occlusion; Complication of anesthesia; PONV (postoperative nausea and vomiting); Hypothyroidism;  Depression; Sleep apnea; Neuropathy; CAD, multiple vessel (2003, 04/2012); Obesity, Class II, BMI 35.0-39.9, with comorbidity (see actual BMI); Dyslipidemia associated with type 2 diabetes mellitus; GERD (gastroesophageal reflux disease); Chronic renal insufficiency, stage II (mild); Pulmonary hypertension (04/2012); HTN (hypertension); Chronic venous insufficiency; CAD S/P percutaneous coronary angioplasty; Anginal pain; and Shortness of breath.  Harold [name]  has past surgical history that includes Umbilical hernia repair (2009); Coronary angioplasty (12/21/2001); colonscopy; Carotid endarterectomy (2005); Back surgery (2005 x1); Lumbar laminectomy/decompression microdiscectomy (01/07/2012); Coronary stent placement (04/28/12); Cardiac catheterization (12/11/2001); Coronary angioplasty with stent (04/29/12); doppler echocardiography (04/28/2012); Carotid Doppler (04/27/2012); and ABIs (04/27/2012).  Prior to Admission medications   Medication Sig Start Date End Date Taking? Authorizing Provider  aspirin EC 81 MG EC tablet Take 1 tablet (81 mg total) by mouth daily. 04/30/12  Yes Luke K Kilroy, PA-C  atenolol (TENORMIN) 25 MG tablet Take 0.5 tablets (12.5 mg total) by mouth every evening. 11/17/12  Yes Leonie Man, MD  BRILINTA 90 MG TABS tablet TAKE 1 TABLET (90 MG TOTAL) BY MOUTH 2 (TWO) TIMES DAILY. 05/07/13  Yes Leonie Man, MD  cholecalciferol (VITAMIN D) 1000 UNITS tablet Take 1,000 Units by mouth 4 (four) times daily.    Yes Historical Provider, MD  escitalopram (LEXAPRO) 20 MG tablet Take 1 tablet (20 mg total) by mouth daily. NEED VISIT!!   Yes  Theda Sers, PA-C  esomeprazole (NEXIUM) 40 MG capsule Take 1 capsule (40 mg total) by mouth daily. 09/08/12  Yes [name], MD  felodipine (PLENDIL) 10 MG 24 hr tablet Take 1 tablet (10 mg total) by mouth daily. 05/16/13  Yes Cecilie Kicks, NP  fish oil-omega-3 fatty acids 1000 MG capsule Take 1,200 mg by mouth daily.    Yes Historical Provider, MD   furosemide (LASIX) 40 MG tablet Take 40 mg by mouth daily. 05/15/13  Yes Cecilie Kicks, NP  Garlic 166 MG CAPS Take 500 mg by mouth daily.   Yes Historical Provider, MD  insulin aspart (NOVOLOG) 100 UNIT/ML injection Inject 50 Units into the skin 2 (two) times daily.  04/29/11  Yes [name], MD  insulin NPH (HUMULIN N,NOVOLIN N) 100 UNIT/ML injection Inject 75 Units into the skin 2 (two) times daily before a meal.    Yes Historical Provider, MD  levothyroxine (SYNTHROID, LEVOTHROID) 88 MCG tablet Take 88 mcg by mouth daily before breakfast.   Yes Historical Provider, MD  lisinopril (PRINIVIL,ZESTRIL) 20 MG tablet Take 0.5 tablets (10 mg total) by mouth daily. 11/17/12  Yes Leonie Man, MD  metFORMIN (GLUCOPHAGE) 1000 MG tablet Take 1 tablet (1,000 mg total) by mouth 2 (two) times daily with a meal. 05/17/13  Yes Cecilie Kicks, NP  Misc Natural Products (LUTEIN 20 PO) Take 20 mg by mouth daily.   Yes Historical Provider, MD  Multiple Vitamins-Minerals (MULTIVITAMIN WITH MINERALS) tablet Take 1 tablet by mouth daily.   Yes Historical Provider, MD  nitroGLYCERIN (NITROSTAT) 0.4 MG SL tablet Place 1 tablet (0.4 mg total) under the tongue every 5 (five) minutes as needed for chest pain (severe chest pressure or tightness). 04/30/12  Yes Luke K Kilroy, PA-C  TURMERIC PO Take 800 mg by mouth daily.   Yes Historical Provider, MD    Allergies  Allergen Reactions  . Septra [Bactrim] Itching  . Penicillins Hives    Allergy to All cillin drugs  . Other     Walnuts, pecans     His family history is not on file. He was adopted.  He  reports that he quit smoking about 10 years ago. His smoking use included Cigarettes. He has a 42 pack-year smoking history. He has never used smokeless tobacco. He reports that he does not drink alcohol or use illicit drugs.  Review of Systems  Constitutional: Positive for unexpected weight change. Negative for fever, chills, diaphoresis, activity change, appetite  change and fatigue.  HENT: Positive for congestion. Negative for dental problem, ear discharge, ear pain, facial swelling, hearing loss, mouth sores, nosebleeds, postnasal drip, rhinorrhea, sinus pressure, sneezing, sore throat, tinnitus, trouble swallowing and voice change.   Eyes: Negative for photophobia, discharge, itching and visual disturbance.  Respiratory: Positive for cough. Negative for apnea, choking, chest tightness, shortness of breath, wheezing and stridor.   Cardiovascular: Negative for chest pain, palpitations and leg swelling.  Gastrointestinal: Positive for abdominal pain. Negative for nausea, vomiting, constipation, blood in stool and abdominal distention.  Genitourinary: Negative for dysuria, urgency, frequency, hematuria, flank pain, decreased urine volume and difficulty urinating.  Musculoskeletal: Negative for arthralgias, back pain, gait problem, joint swelling, myalgias, neck pain and neck stiffness.  Skin: Negative for color change, pallor and rash.  Neurological: Negative for dizziness, tremors, seizures, syncope, speech difficulty, weakness, light-headedness, numbness and headaches.  Hematological: Negative for adenopathy. Does not bruise/bleed easily.  Psychiatric/Behavioral: Negative for confusion, sleep disturbance and agitation. The patient is  not nervous/anxious.    Physical Exam:  General - No distress ENT - No sinus tenderness, no oral exudate, no LAN, no thyromegaly, TM clear, pupils equal/reactive Cardiac - s1s2 regular, no murmur, pulses symmetric Chest - No wheeze/rales/dullness, good air entry, normal respiratory excursion Back - No focal tenderness Abd - Soft, non-tender, no organomegaly, + bowel sounds Ext - No edema Neuro - Normal strength, cranial nerves intact Skin - No rashes Psych - Normal mood, and behavior  Lab Results  Component Value Date   WBC 6.1 05/13/2013   HGB 14.1 05/13/2013   HCT 38.8* 05/13/2013   MCV 86.6 05/13/2013   PLT 187  05/13/2013    Lab Results  Component Value Date   CREATININE 1.13 05/14/2013   BUN 16 05/14/2013   NA 139 05/14/2013   K 4.0 05/14/2013   CL 98 05/14/2013   CO2 24 05/14/2013    Lab Results  Component Value Date   ALT 52 05/14/2013   AST 42* 05/14/2013   ALKPHOS 58 05/14/2013   BILITOT 0.4 05/14/2013    Lab Results  Component Value Date   TSH 1.395 05/14/2013    BNP    Component Value Date/Time   PROBNP 98.6 05/13/2013 1200      Assessment/Plan:  Chesley Mires, MD [facility] Pager:  [phone]

## 2013-06-17 NOTE — Patient Instructions (Signed)
Spiriva one puff daily Proair two puffs up to four times per day as needed for cough, wheeze, or chest congestion Will arrange for overnight oxygen test Follow up in 2 months

## 2013-06-17 NOTE — Progress Notes (Deleted)
   Subjective:    Patient ID: Harold [name], male    DOB: Oct 28, 1955, 58 y.o.   MRN: [id number]  HPI    Review of Systems  Constitutional: Positive for unexpected weight change. Negative for fever, chills, diaphoresis, activity change, appetite change and fatigue.  HENT: Positive for congestion. Negative for dental problem, ear discharge, ear pain, facial swelling, hearing loss, mouth sores, nosebleeds, postnasal drip, rhinorrhea, sinus pressure, sneezing, sore throat, tinnitus, trouble swallowing and voice change.   Eyes: Negative for photophobia, discharge, itching and visual disturbance.  Respiratory: Positive for cough. Negative for apnea, choking, chest tightness, shortness of breath, wheezing and stridor.   Cardiovascular: Negative for chest pain, palpitations and leg swelling.  Gastrointestinal: Positive for abdominal pain. Negative for nausea, vomiting, constipation, blood in stool and abdominal distention.  Genitourinary: Negative for dysuria, urgency, frequency, hematuria, flank pain, decreased urine volume and difficulty urinating.  Musculoskeletal: Negative for arthralgias, back pain, gait problem, joint swelling, myalgias, neck pain and neck stiffness.  Skin: Negative for color change, pallor and rash.  Neurological: Negative for dizziness, tremors, seizures, syncope, speech difficulty, weakness, light-headedness, numbness and headaches.  Hematological: Negative for adenopathy. Does not bruise/bleed easily.  Psychiatric/Behavioral: Negative for confusion, sleep disturbance and agitation. The patient is not nervous/anxious.        Objective:   Physical Exam        Assessment & Plan:

## 2013-06-17 NOTE — Assessment & Plan Note (Signed)
This is likely related to his weight.   

## 2013-06-17 NOTE — Assessment & Plan Note (Signed)
Secondary to possible COPD, diastolic dysfunction and sleep disordered breathing.  Continue to optimize secondary causes of this.

## 2013-06-17 NOTE — Assessment & Plan Note (Signed)
Related to possible COPD, diastolic dysfunction, and obesity with deconditioning.  He also has history of OSA and could have component of OHS.    Will give him trial of spiriva and prn proair.  Will arrange for overnight oximetry on room air with CPAP.  Explained importance of weight loss, and eventually getting more involved with a gradual exercise regimen to improve his conditioning >> this has been limited due to his neuropathy pain.

## 2013-06-17 NOTE — Assessment & Plan Note (Signed)
He has extensive prior history of smoking.  His PFT's show mixed obstructive/restrictive defect.  Will try him on spiriva and prn proair.

## 2013-06-17 NOTE — Telephone Encounter (Signed)
Harold [name],  I will get an overnight oximetry with him using CPAP.  Depending on this will discuss with you about whether he needs to have adjustment to his sleep apnea regimen.  Thanks.  Zira Helinski

## 2013-06-17 NOTE — Telephone Encounter (Signed)
Dear Harold [name],   [name] has been followed by Dr. Jannifer Franklin as his general neurologist and I have seen him for OSA with restricitive lung disease.  Would you like him to be seen soon for a sleep clinic appointment or do you feel his inhaler therapy is what he needs.  I am happy to work with you,  Asencion Partridge D.

## 2013-06-20 ENCOUNTER — Encounter: Payer: Self-pay | Admitting: Pulmonary Disease

## 2013-06-22 ENCOUNTER — Ambulatory Visit (INDEPENDENT_AMBULATORY_CARE_PROVIDER_SITE_OTHER): Payer: PRIVATE HEALTH INSURANCE | Admitting: Neurology

## 2013-06-22 ENCOUNTER — Encounter: Payer: Self-pay | Admitting: Neurology

## 2013-06-22 VITALS — BP 144/64 | HR 64 | Ht 73.0 in | Wt 320.0 lb

## 2013-06-22 DIAGNOSIS — G629 Polyneuropathy, unspecified: Secondary | ICD-10-CM

## 2013-06-22 DIAGNOSIS — G589 Mononeuropathy, unspecified: Secondary | ICD-10-CM

## 2013-06-22 NOTE — Progress Notes (Signed)
Reason for visit: Peripheral neuropathy  Harold [name] is a 58 y.o. male  History of present illness:  [name] is a 58 year old left-handed white male with a history of obesity, diabetes, and cerebrovascular disease. He has noted some problems with tingling and sharp pains in the feet that began one year ago, also associated with some numbness in the hands. Within the last 3 or 4 months, he has developed claudication or pseudocoarctation symptoms in both legs, right greater left. He can only walk 30 or 40 feet without significant discomfort in both legs. He does have some low back pain, and he has had prior surgery on his low back 4 times. He recently underwent EMG and nerve conduction studies mainly because he has been noted to have muscle enzyme elevations in the 400-500 range. No evidence of a myopathic disorder was found, but the patient was noted to have a severe peripheral neuropathy. An overlying L5 or S1 radiculopathy cannot be excluded. He denies any significant balance problems, and he denies problems controlling the bowels or the bladder. Within the last week or 2, he has noted some swelling in the legs bilaterally. A thyroid study was done recently and was unremarkable. He denies any significant neck discomfort or pain down the arms. He is sent to this office for further evaluation. The last lumbosacral spine surgery was done in 2010 by [name].  Past Medical History  Diagnosis Date  . Diabetes mellitus   . Carotid artery occlusion   . Complication of anesthesia   . PONV (postoperative nausea and vomiting)     "Patch Works"  . Hypothyroidism   . Depression     situaltional   . Sleep apnea   . Neuropathy     notably improved following PCI with improved cardiac function  . CAD, multiple vessel 2003, 04/2012    status post PCI to LAD, circumflex-OM 2, RCA  . Obesity, Class II, BMI 35.0-39.9, with comorbidity (see actual BMI)     BMI 39; wgt loss efforts in place; seeing  Dietician  . Dyslipidemia associated with type 2 diabetes mellitus   . GERD (gastroesophageal reflux disease)   . Chronic renal insufficiency, stage II (mild)   . Pulmonary hypertension 04/2012    PA pressure 75mhg  . HTN (hypertension)   . Chronic venous insufficiency     varicosities, no reflux; dopplers 04/14/12- valvular insufficiency in the R and L GSV  . CAD S/P percutaneous coronary angioplasty     see cath and PCI and post surgical history  . Anginal pain   . Shortness of breath   . Obesity     Past Surgical History  Procedure Laterality Date  . Umbilical hernia repair  2009    steel mesh insert  . Coronary angioplasty  12/21/2001    PTCA of the distal and mid AV groove circ, unsuccessful PTCA of second OM total occlusion, unsuccessful PTCA of the apical LAD total occlusion  . Colonscopy    . Carotid endarterectomy  2005    Right; recent carotid Dopplers notes elevated velocities.   . Back surgery  2005 x1    X2-2010  . Lumbar laminectomy/decompression microdiscectomy  01/07/2012    Procedure: LUMBAR LAMINECTOMY/DECOMPRESSION MICRODISCECTOMY 1 LEVEL;  Surgeon: KWinfield Cunas MD;  [facility];  Service: Neurosurgery;  Laterality: Bilateral;   Lumbar Three-Four Decompression  . Coronary stent placement  04/28/12    PCI to LAD (3x273mXience DES postdilated to 3.25) and  circ prox and mid (2 overlappinmg 2.49mx12mmXience DES postdilated to 2.748m  . Cardiac catheterization  12/11/2001    significant 3V CAD, normal LV function  . Coronary angioplasty with stent placement  04/29/12    PCI to 3 RCA lesions, Promus Premiere 2.2554mmm distally, mid was 2.5mx60mm and proximally 2.75x16mm21m 55-60%  . Doppler echocardiography  04/28/2012    poor quality study: EF estimated 60-65%; unable to assess diastolic function (previously noted to have diastolic dysfunction); severely dilated left atrium and mild right atrium; dilated IVC consistent with increased central venous  pressure..  . Carotid doppler  04/27/2012    right internal carotid: Elevated velocities but no evidence of plaque. Left internal carotid 40-59%  . Abis  04/27/2012    mild bilateral arterial insufficiency  . Carotid endarterectomy Right 2005    Family History  Problem Relation Age of Onset  . Adopted: Yes    Social history:  reports that he quit smoking about 10 years ago. His smoking use included Cigarettes. He has a 42 pack-year smoking history. He has never used smokeless tobacco. He reports that he does not drink alcohol or use illicit drugs.  Medications:  Current Outpatient Prescriptions on File Prior to Visit  Medication Sig Dispense Refill  . albuterol (PROAIR HFA) 108 (90 BASE) MCG/ACT inhaler Inhale 2 puffs into the lungs every 6 (six) hours as needed for wheezing or shortness of breath.  1 Inhaler  5  . aspirin EC 81 MG EC tablet Take 1 tablet (81 mg total) by mouth daily.      . ateMarland Kitchenolol (TENORMIN) 25 MG tablet Take 0.5 tablets (12.5 mg total) by mouth every evening.  30 tablet  11  . BRILINTA 90 MG TABS tablet TAKE 1 TABLET (90 MG TOTAL) BY MOUTH 2 (TWO) TIMES DAILY.  60 tablet  11  . cholecalciferol (VITAMIN D) 1000 UNITS tablet Take 1,000 Units by mouth 4 (four) times daily.       . escMarland Kitchentalopram (LEXAPRO) 20 MG tablet Take 1 tablet (20 mg total) by mouth daily. NEED VISIT!!  30 tablet  2  . esomeprazole (NEXIUM) 40 MG capsule Take 1 capsule (40 mg total) by mouth daily.  30 capsule  11  . felodipine (PLENDIL) 10 MG 24 hr tablet Take 1 tablet (10 mg total) by mouth daily.  30 tablet  6  . fish oil-omega-3 fatty acids 1000 MG capsule Take 1,200 mg by mouth daily.       . furosemide (LASIX) 40 MG tablet Take 40 mg by mouth daily.      . Garlic 500 M161APS Take 500 mg by mouth daily.      . insulin aspart (NOVOLOG) 100 UNIT/ML injection Inject 50 Units into the skin 2 (two) times daily.       . insulin NPH (HUMULIN N,NOVOLIN N) 100 UNIT/ML injection Inject 75 Units into the  skin 2 (two) times daily before a meal.       . levothyroxine (SYNTHROID, LEVOTHROID) 88 MCG tablet Take 88 mcg by mouth daily before breakfast.      . lisinopril (PRINIVIL,ZESTRIL) 20 MG tablet Take 0.5 tablets (10 mg total) by mouth daily.  30 tablet  11  . metFORMIN (GLUCOPHAGE) 1000 MG tablet Take 1 tablet (1,000 mg total) by mouth 2 (two) times daily with a meal.      . Misc Natural Products (LUTEIN 20 PO) Take 20 mg by mouth daily.      . Multiple Vitamins-Minerals (MULTIVITAMIN WITH  MINERALS) tablet Take 1 tablet by mouth daily.      . nitroGLYCERIN (NITROSTAT) 0.4 MG SL tablet Place 1 tablet (0.4 mg total) under the tongue every 5 (five) minutes as needed for chest pain (severe chest pressure or tightness).  25 tablet  2  . tiotropium (SPIRIVA) 18 MCG inhalation capsule Place 1 capsule (18 mcg total) into inhaler and inhale daily.  30 capsule  5  . TURMERIC PO Take 800 mg by mouth daily.      . [DISCONTINUED] insulin lispro (HUMALOG) 100 UNIT/ML injection Inject 25 Units into the skin 3 (three) times daily before meals.       No current facility-administered medications on file prior to visit.      Allergies  Allergen Reactions  . Septra [Bactrim] Itching  . Penicillins Hives    Allergy to All cillin drugs  . Other     Walnuts, pecans     ROS:  Out of a complete 14 system review of symptoms, the patient complains only of the following symptoms, and all other reviewed systems are negative.  Weight gain Swelling in the legs Shortness of breath, wheezing Achy muscles Weakness Snoring  Blood pressure 144/64, pulse 64, height _0  (1.854 m), weight 320 lb (145.151 kg).  Physical Exam  General: The patient is alert and cooperative at the time of the examination. The patient is markedly obese.  Eyes: Pupils are equal, round, and reactive to light. Discs are flat bilaterally.  Neck: The neck is supple, no carotid bruits are noted.  Respiratory: The respiratory  examination is clear.  Cardiovascular: The cardiovascular examination reveals a regular rate and rhythm, no obvious murmurs or rubs are noted.  Neuromuscular: The patient is able to flex about 60 or 70 with the low back.  Skin: Extremities are with 2-3+ edema below the knees bilaterally.  Neurologic Exam  Mental status: The patient is alert and oriented x 3 at the time of the examination. The patient has apparent normal recent and remote memory, with an apparently normal attention span and concentration ability.  Cranial nerves: Facial symmetry is present. There is good sensation of the face to pinprick and soft touch bilaterally. The strength of the facial muscles and the muscles to head turning and shoulder shrug are normal bilaterally. Speech is well enunciated, no aphasia or dysarthria is noted. Extraocular movements are full. Visual fields are full. The tongue is midline, and the patient has symmetric elevation of the soft palate. No obvious hearing deficits are noted.  Motor: The motor testing reveals 5 over 5 strength of all 4 extremities. Good symmetric motor tone is noted throughout.  Sensory: Sensory testing is intact to pinprick, soft touch, vibration sensation, and position sense on all 4 extremities, with the exception that there is a stocking pattern pinprick sensory deficit to just below the knees bilaterally, with significant vibratory sensation impairment in both feet. Position sense is approximately normal in the feet. No evidence of extinction is noted.  Coordination: Cerebellar testing reveals good finger-nose-finger and heel-to-shin bilaterally.  Gait and station: Gait is normal. Tandem gait is minimally unsteady. Romberg is negative. No drift is seen.  Reflexes: Deep tendon reflexes are symmetric, but are depressed bilaterally. Toes are downgoing bilaterally.   Assessment/Plan:  1. Diabetes  2. Peripheral neuropathy  3. Lumbosacral spine disease  4.  Claudication or pseudo-claudication  The patient indicates that he recently had arterial Doppler studies of the lower extremities that did not show severe occlusive disease. He  has developed numbness in the legs within the last year, and claudication type symptoms in both legs within the last 3 or 4 months. He will be sent for MRI evaluation of the lumbosacral spine, and he will be sent for blood work to evaluate the possible etiologies of his peripheral neuropathy. The CK enzyme elevations are likely related to the active denervation seen by EMG evaluation on the legs. He will followup in 6 months.  Jill Alexanders MD 06/22/2013 8:53 PM  [facility] [address] [place], [place] [address]  Phone [phone] Fax [phone]

## 2013-06-22 NOTE — Patient Instructions (Signed)

## 2013-06-24 ENCOUNTER — Ambulatory Visit (INDEPENDENT_AMBULATORY_CARE_PROVIDER_SITE_OTHER): Payer: PRIVATE HEALTH INSURANCE | Admitting: Cardiology

## 2013-06-24 ENCOUNTER — Encounter: Payer: Self-pay | Admitting: Cardiology

## 2013-06-24 VITALS — BP 154/76 | HR 64 | Ht 73.0 in | Wt 321.3 lb

## 2013-06-24 DIAGNOSIS — Z9861 Coronary angioplasty status: Secondary | ICD-10-CM

## 2013-06-24 DIAGNOSIS — I2510 Atherosclerotic heart disease of native coronary artery without angina pectoris: Secondary | ICD-10-CM

## 2013-06-24 DIAGNOSIS — E785 Hyperlipidemia, unspecified: Secondary | ICD-10-CM

## 2013-06-24 DIAGNOSIS — IMO0001 Reserved for inherently not codable concepts without codable children: Secondary | ICD-10-CM

## 2013-06-24 DIAGNOSIS — I10 Essential (primary) hypertension: Secondary | ICD-10-CM

## 2013-06-24 DIAGNOSIS — I2789 Other specified pulmonary heart diseases: Secondary | ICD-10-CM

## 2013-06-24 DIAGNOSIS — Z79899 Other long term (current) drug therapy: Secondary | ICD-10-CM

## 2013-06-24 DIAGNOSIS — I5032 Chronic diastolic (congestive) heart failure: Secondary | ICD-10-CM

## 2013-06-24 DIAGNOSIS — I272 Pulmonary hypertension, unspecified: Secondary | ICD-10-CM

## 2013-06-24 MED ORDER — CARVEDILOL 6.25 MG PO TABS: 6.2500 mg | ORAL_TABLET | Freq: Two times a day (BID) | ORAL | Status: DC

## 2013-06-24 MED ORDER — CLOPIDOGREL BISULFATE 75 MG PO TABS: 75.0000 mg | ORAL_TABLET | Freq: Every day | ORAL | Status: DC

## 2013-06-24 NOTE — Patient Instructions (Addendum)
Your physician wants you to follow-up in 3 month Dr Ellyn Hack - 30 minute appointment.  You will receive a reminder letter in the mail two months in advance. If you don't receive a letter, please call our office to schedule the follow-up appointment.  Continue Brilinta until you have one week supply left.  Then start Clopidogrel 20m daily.  Your physician recommends that you return for lab work in: 3 weeks after starting Clopidogrel.  STOP Atenolol.  START Carvedilol 6.212mtwice a day.

## 2013-06-25 LAB — IFE AND PE, SERUM
ALPHA 1: 0.2 g/dL (ref 0.1–0.4)
Albumin SerPl Elph-Mcnc: 4.1 g/dL (ref 3.2–5.6)
Albumin/Glob SerPl: 1.6 (ref 0.7–2.0)
Alpha2 Glob SerPl Elph-Mcnc: 0.8 g/dL (ref 0.4–1.2)
B-Globulin SerPl Elph-Mcnc: 1 g/dL (ref 0.6–1.3)
Gamma Glob SerPl Elph-Mcnc: 0.8 g/dL (ref 0.5–1.6)
Globulin, Total: 2.7 g/dL (ref 2.0–4.5)
IGA/IMMUNOGLOBULIN A, SERUM: 116 mg/dL (ref 91–414)
IGM (IMMUNOGLOBULIN M), SRM: 106 mg/dL (ref 40–230)
IgG (Immunoglobin G), Serum: 780 mg/dL (ref 700–1600)
Total Protein: 6.8 g/dL (ref 6.0–8.5)

## 2013-06-25 LAB — ANGIOTENSIN CONVERTING ENZYME: Angio Convert Enzyme: <14 U/L — ABNORMAL LOW (ref 14–82)

## 2013-06-25 LAB — ANA W/REFLEX: Anti Nuclear Antibody(ANA): NEGATIVE

## 2013-06-25 LAB — LYME, TOTAL AB TEST/REFLEX: Lyme IgG/IgM Ab: <0.91 {ISR} (ref 0.00–0.90)

## 2013-06-25 LAB — COPPER, SERUM: Copper: 123 ug/dL (ref 72–166)

## 2013-06-25 LAB — RHEUMATOID FACTOR: Rhuematoid fact SerPl-aCnc: 16.1 IU/mL — ABNORMAL HIGH (ref 0.0–13.9)

## 2013-06-25 LAB — VITAMIN B12: Vitamin B-12: 261 pg/mL (ref 211–946)

## 2013-06-27 ENCOUNTER — Encounter: Payer: Self-pay | Admitting: Cardiology

## 2013-06-27 DIAGNOSIS — I5032 Chronic diastolic (congestive) heart failure: Secondary | ICD-10-CM | POA: Insufficient documentation

## 2013-06-27 NOTE — Assessment & Plan Note (Signed)
With stable appearing stents are present cath, we are now close to a year out from his PCI. Plan is to switch to Plavix when he finishes his current Brilinta refill. 2 weeks after starting Plavix Will check P2Y12 inhibition assay. If okay, he can stop aspirin as well and to stay on Plavix.  He does have plenty of reasons for having exertional angina based on his small vessel disease. We have room to increase blood pressure control. He is currently on atenolol which switching to carvedilol, ACE inhibitor and Channel blocker. He is not on a long-acting nitrate or Ranexa as next options

## 2013-06-27 NOTE — Assessment & Plan Note (Signed)
He seems to be slipping down the wrong way , gaining back weight. He again is low at 280 pounds now he is up to over 320 pounds again.   Unfortunately with his-like pains, he has a difficult time walking. He has become frustrated because he can't figure way to try to exercise. I recommended that he do water aerobics or walking. He enjoyed her continuing to go to nutrition counseling. I continued to encourage him to continue for with his efforts.

## 2013-06-27 NOTE — Assessment & Plan Note (Signed)
Combination of COPD, and diastolic dysfunction and sleep disordered breathing with possible obesity hypoventilation syndrome. Plan: Diet and exercise for weight loss, treat COPD and after reduction, and use CPAP.

## 2013-06-27 NOTE — Assessment & Plan Note (Signed)
Not as well-controlled. He is back to the 20 mg of lisinopril. Switching from atenolol 25 to carvedilol 6.25 twice a day. Hopefully this will give better blood pressure control for additional afterload reduction.

## 2013-06-27 NOTE — Assessment & Plan Note (Signed)
Mrs. Harold [name], a major problem for him. He is back on fish oil, but based on his recent significant myalgias with fenofibrate and statin, I am reluctant to go back anything yet. I may very will consider referral to the lipid clinic at [facility]

## 2013-06-27 NOTE — Assessment & Plan Note (Signed)
Now that he has been off of statin and fenofibrate for quite some time and still having the same leg discomfort, my examination is that it probably wasn't related to this combination. I would not however restart both. Would consider potentially switching to a different statin, but once his other symptoms are stabilized.

## 2013-06-27 NOTE — Assessment & Plan Note (Signed)
Increasing afterload reduction is key here. Switching from atenolol to carvedilol. Would then need to consider whether or not we need to add Bidil.  I would stay on Lasix 40 mg daily and Lasix initial 20 mg as needed for additional weight gain/edema.

## 2013-06-27 NOTE — Progress Notes (Signed)
PCP: [name], MD  Clinic Note: Chief Complaint  Patient presents with  . 2 month follow up    no chest pain , sob , leg pain still, back pain   HPI: Harold [name] is a 58 y.o. male with a Cardiovascular Problem List below who presents today for second post hospital followup. He was seen by Ellen Henri, PA on April 14 following his recent consultation for chest pain dyspnea. At that time he underwent cardiac catheterization that revealed essentially stable disease with significant distal small vessel stenoses. Again as previously noted he had moderate pulmonary hypertension. Since his discharge from his diet any further chest discomfort that was similar to what he was admitted for. When I last saw in, he was having lots of trouble with myalgias and neuropathy symptoms. He notes continued to have significant neuropathy pain which is persistent despite the fact he is now been off his statin for quite some time. This would suggest that is not aggravated myopathy. He is seeing a neurologist. He is also seeing a pulmonologist who is treating his lung disease. He continues to use CPAP.  Interval History: Today he really from a cardiac standpoint is doing relatively well. He has not had any additional chest discomfort. His exertion is more limited by pain in his legs from his neuropathy. He had a relatively normal for grocery Dopplers done back in January. He doesn't usually lay down flat, will note getting dyspneic if he lies down flat. This is probably more related to his significant abdominal girth. He does get exertional dyspnea, but it is not what limits him. No PND, but he does have Tomi Bamberger 40 edema that has gotten a little worse of late he was told to increase his Lasix to 40 twice a day, is recently back to back down.Marland Kitchen He denies any rapid or irregular heartbeats, lightheadedness, dizziness, syncope/ near syncope, TIAs amaurosis fugax symptoms. No melena, hematochezia, hematuria, epistaxis.  No claudication.  Past Medical History  Diagnosis Date  . Diabetes mellitus   . Carotid artery occlusion   . Complication of anesthesia   . PONV (postoperative nausea and vomiting)     "Patch Works"  . Hypothyroidism   . Depression     situaltional   . Sleep apnea   . Neuropathy     notably improved following PCI with improved cardiac function  . CAD S/P percutaneous coronary angioplasty 2003, 04/2012    status post PCI to LAD, circumflex-OM 2, RCA  . 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 Dyslipidemia associated with type 2 diabetes mellitus   . GERD (gastroesophageal reflux disease)   . Chronic renal insufficiency, stage II (mild)   . Pulmonary hypertension 04/2012    PA pressure 50mhg  . HTN (hypertension)   . Chronic venous insufficiency     varicosities, no reflux; dopplers 04/14/12- valvular insufficiency in the R and L GSV  . Anginal pain March 2015    Cardiac cath showed patent stents with distal LAD, circumflex-OM and RCA disease in small vessels.  . Shortness of breath   . Obesity     Prior Cardiac/Cardiovascular Evaluation /History   Procedure Laterality Date  . Coronary angioplasty  12/21/2001    PTCA of the distal and mid AV groove circ, unsuccessful PTCA of second OM total occlusion, unsuccessful PTCA of the apical LAD total occlusion  . Carotid endarterectomy  2005    Right; recent carotid Dopplers notes elevated velocities.   . Coronary stent placement  04/28/12    PCI to LAD (3x61m Xience DES postdilated to 3.25) and circ prox and mid (2 overlappinmg 2.537m12mmXience DES postdilated to 2.7592m . Cardiac catheterization  12/11/2001    significant 3V CAD, normal LV function  . Coronary angioplasty with stent placement  04/29/12    PCI to 3 RCA lesions, Promus Premiere 2.71m40mm distally, mid was 2.5mx228m and proximally 2.75x16mm,55m55-60%  . Doppler echocardiography   04/28/2012    poor quality study: EF estimated 60-65%; unable to assess diastolic function (previously noted to have diastolic dysfunction); severely dilated left atrium and mild right atrium; dilated IVC consistent with increased central venous pressure..  . Carotid doppler  04/27/2012    right internal carotid: Elevated velocities but no evidence of plaque. Left internal carotid 40-59%  . Abis  04/27/2012    mild bilateral arterial insufficiency  . Carotid endarterectomy Right 2005  . Cardiac catheterization  March 2015    Moderate Pulm HTN: 46/16 - mean 33 mmHg; PCWP 22mmHg23[name], widely patent proximal and mid cervical extends with previous subtotal occlusion of small OM 2, patent RCA ostial mid and distal stents with 70% distal continuation RCA disease   MEDICATIONS AND ALLERGIES REVIEWED IN EPIC  History   Social History Narrative   He and his partner Harold Iona Beardow officially gotten married on 05/26/2013.  Harold [name] a patient of our clinic.   He works at [employer] not smoke, he quit in 2009.  He does not drink alcohol.   He was adopted and no family history is known.     ROS: A comprehensive Review of Systems - Negative except Symptoms are in history of present illness. He still has arthritis symptoms in his knees and hips. He gets short of breath bending over. No longer having myalgias. Still has bad back pain.  PHYSICAL EXAM BP 154/76  Pulse 64  Ht _0  (1.854 m)  Wt 321 lb 4.8 oz (145.741 kg)  BMI 42.40 kg/m2 General appearance: alert, cooperative, appears stated age, no distress and borderline morbidly obese -- 30 pound weight gain since last visit  Neck: no adenopathy, no carotid bruit and no JVD  Lungs: clear to auscultation bilaterally, normal percussion bilaterally and non-labored  Heart: regular rate and rhythm, S1, S2 normal, no murmur, click, rub or gallop; nondisplaced PMI.  Occasional ectopic beats.  Abdomen: soft, non-tender; bowel sounds normal; no masses, no organomegaly; rotund, obese abdomen. There is tenderness to the right lower quadrant as well as the umbilical region.  Extremities: extremities normal, atraumatic, no cyanosis, and trace to 1+ lower extremity pitting edema  Pulses: 2+ and symmetric;  Neurologic: Mental status: Alert, oriented, thought content appropriate  Cranial nerves: normal (II-XII grossly intact   Adult ECG Report - not checked Recent Labs : 05/14/2013: Total cholesterol 262, triglycerides down to 69 and HDL 43. Unable to calculate LDL  ASSESSMENT / PLAN: CAD, LAD/CFX DES 04/28/12- staged RCA DES 04/29/12 after pt declined CABG, cath 05/14/13 stable CAD medical therapy With stable appearing stents are present cath, we are now close to a year out from his PCI. Plan is to switch to Plavix when he finishes his current Brilinta refill. 2 weeks after starting Plavix Will check P2Y12 inhibition assay. If okay, he can stop aspirin as well and  to stay on Plavix.  He does have plenty of reasons for having exertional angina based on his small vessel disease. We have room to increase blood pressure control. He is currently on atenolol which switching to carvedilol, ACE inhibitor and Channel blocker. He is not on a long-acting nitrate or Ranexa as next options  Hypertension Not as well-controlled. He is back to the 20 mg of lisinopril. Switching from atenolol 25 to carvedilol 6.25 twice a day. Hopefully this will give better blood pressure control for additional afterload reduction.  Pulmonary hypertension, 88mhg Combination of COPD, and diastolic dysfunction and sleep disordered breathing with possible obesity hypoventilation syndrome. Plan: Diet and exercise for weight loss, treat COPD and after reduction, and use CPAP.  Obesity, Class III, BMI 40-49.9 (morbid obesity) He seems to be slipping down the wrong way , gaining back weight. He again is  low at 280 pounds now he is up to over 320 pounds again.   Unfortunately with his-like pains, he has a difficult time walking. He has become frustrated because he can't figure way to try to exercise. I recommended that he do water aerobics or walking. He enjoyed her continuing to go to nutrition counseling. I continued to encourage him to continue for with his efforts.  Hyperlipidemia Mrs. KMerrilyn Puma a major problem for him. He is back on fish oil, but based on his recent significant myalgias with fenofibrate and statin, I am reluctant to go back anything yet. I may very will consider referral to the lipid [facility] at [facility] Chronic diastolic heart failure, NYHA class 2 - PCWPf 22 mmHg during catheterization. Increasing afterload reduction is key here. Switching from atenolol to carvedilol. Would then need to consider whether or not we need to add Bidil.  I would stay on Lasix 40 mg daily and Lasix initial 20 mg as needed for additional weight gain/edema.  Myalgia and myositis Now that he has been off of statin and fenofibrate for quite some time and still having the same leg discomfort, my examination is that it probably wasn't related to this combination. I would not however restart both. Would consider potentially switching to a different statin, but once his other symptoms are stabilized.    Orders Placed This Encounter  Procedures  . Platelet inhibition p2y12   Meds ordered this encounter  Medications  . carvedilol (COREG) 6.25 MG tablet    Sig: Take 1 tablet (6.25 mg total) by mouth 2 (two) times daily.    Dispense:  60 tablet    Refill:  3  . clopidogrel (PLAVIX) 75 MG tablet    Sig: Take 1 tablet (75 mg total) by mouth daily.    Dispense:  30 tablet    Refill:  3   Posterior 45 minutes spent with patient; over 50% of the time with counseling. Followup: 3 months  DAVID W. HEllyn Hack M.D., M.S. Interventional Cardiologist [facility]

## 2013-07-05 ENCOUNTER — Ambulatory Visit
Admission: RE | Admit: 2013-07-05 | Discharge: 2013-07-05 | Disposition: A | Discharge status: Home / Self Care | Payer: PRIVATE HEALTH INSURANCE | Source: Ambulatory Visit | Attending: Neurology | Admitting: Neurology

## 2013-07-05 DIAGNOSIS — R209 Unspecified disturbances of skin sensation: Secondary | ICD-10-CM

## 2013-07-05 DIAGNOSIS — G629 Polyneuropathy, unspecified: Secondary | ICD-10-CM

## 2013-07-05 MED ORDER — GADOBENATE DIMEGLUMINE 529 MG/ML IV SOLN
20.0000 mL | Freq: Once | INTRAVENOUS | Status: AC | PRN
  Administered 2013-07-05: 20 mL via INTRAVENOUS

## 2013-07-07 ENCOUNTER — Telehealth: Payer: Self-pay | Admitting: Neurology

## 2013-07-07 DIAGNOSIS — M545 Low back pain, unspecified: Secondary | ICD-10-CM

## 2013-07-07 NOTE — Telephone Encounter (Signed)
I called patient. The MRI that did not show evidence of severe spinal stenosis. The patient continues to have back pain and leg pain with walking. I will try an epidural steroid injection to see this helps the discomfort. The patient does have a significant peripheral neuropathy as well. Blood work for this was unremarkable.   MRI lumbosacral spine 07/07/2013:  Impression   Abnormal MRI lumbar spine (with and without) demonstrating: 1. At L2-3: disc bulging, facet hypertrophy, epidural liponmatosis with  mild-moderate spinal stenosis and mild right and moderate left foraminal  stenosis 2. At L5-S1: leftward disc protrusion and facet hypertrophy with left  lateral recess stenosis and potential impingement upon descending left S1  root 3. At L3-4: facet hypertrophy with mild right foraminal stenosis 4. Compared to MRI on 12/19/11, post-surgical changes at L3-4 are new;  otherwise no change.

## 2013-07-08 ENCOUNTER — Other Ambulatory Visit: Payer: Self-pay | Admitting: Neurology

## 2013-07-08 DIAGNOSIS — M545 Low back pain, unspecified: Secondary | ICD-10-CM

## 2013-07-08 DIAGNOSIS — M79606 Pain in leg, unspecified: Secondary | ICD-10-CM

## 2013-07-19 ENCOUNTER — Telehealth: Payer: Self-pay

## 2013-07-19 NOTE — Telephone Encounter (Signed)
Called pt because we received a memo from pt's pharm saying there are interactions between Plavix and Nexium and Dr. Everlene Farrier wanted him to contact his GI doctor to change the Nexium Dr. Everlene Farrier, pt says Dr. Ellyn Hack just recently put him on Plavix, but you were the one that put him on Nexium.

## 2013-07-20 MED ORDER — RANITIDINE HCL 300 MG PO TABS: 300.0000 mg | ORAL_TABLET | Freq: Every day | ORAL | Status: DC

## 2013-07-20 NOTE — Telephone Encounter (Signed)
Thanks for your help with Harold [name]. Have a great day

## 2013-07-20 NOTE — Telephone Encounter (Signed)
Thanks - the interaction is nominal, but making appropriate adjustments is warranted.  Plavix is substantially less expensive for Dominica Severin.  Leonie Man, MD

## 2013-07-20 NOTE — Telephone Encounter (Signed)
LMOM to stop the Nexium and that I sent in Zantac for him to take instead

## 2013-07-20 NOTE — Telephone Encounter (Signed)
Please stop the Nexium and change his medication to Zantac 300 mg one at night. He can have #90 with 3 refills.

## 2013-07-23 ENCOUNTER — Telehealth: Payer: Self-pay | Admitting: *Deleted

## 2013-07-23 NOTE — Telephone Encounter (Signed)
Faxed clearance to hold plavix for 5 days for lumbar epidural steriod injection per Dr Ellyn Hack. RN VERIFIED FAX WAS RECEIVED.

## 2013-07-26 ENCOUNTER — Telehealth: Payer: Self-pay | Admitting: Pulmonary Disease

## 2013-07-26 NOTE — Telephone Encounter (Signed)
ONO with CPAP and RA 06/28/13 >> test time 7 hrs 32 min.  Mean SpO2 93%, low SpO2 83%.  Spent 4 min 52 seconds with SpO2 < 88%.  Will have my nurse inform pt that ONO looked good.  No change to current set up.

## 2013-07-26 NOTE — Telephone Encounter (Signed)
LMOM x 1

## 2013-07-27 NOTE — Telephone Encounter (Signed)
LMOM x 2

## 2013-07-28 NOTE — Telephone Encounter (Signed)
Pt returned call & can be reached at [phone].  Pt will not be available after 1PM today.  Harold [name]

## 2013-07-29 ENCOUNTER — Other Ambulatory Visit: Payer: Self-pay | Admitting: Dermatology

## 2013-07-29 NOTE — Telephone Encounter (Signed)
Pt is calling back to get his results.  Will be available until 12:30 at [phone] and then he has to go to work.

## 2013-07-29 NOTE — Telephone Encounter (Signed)
I spoke with patient about results and he verbalized understanding and had no questions 

## 2013-08-02 ENCOUNTER — Ambulatory Visit (HOSPITAL_COMMUNITY)
Admission: AD | Admit: 2013-08-02 | Discharge: 2013-08-02 | Disposition: A | Discharge status: Home / Self Care | Payer: PRIVATE HEALTH INSURANCE | Source: Ambulatory Visit | Attending: Cardiology | Admitting: Cardiology

## 2013-08-02 LAB — PLATELET INHIBITION P2Y12: Platelet Function  P2Y12: 207 [PRU] (ref 194–418)

## 2013-08-05 ENCOUNTER — Telehealth: Payer: Self-pay | Admitting: *Deleted

## 2013-08-05 ENCOUNTER — Encounter: Payer: Self-pay | Admitting: *Deleted

## 2013-08-05 DIAGNOSIS — Z79899 Other long term (current) drug therapy: Secondary | ICD-10-CM

## 2013-08-05 NOTE — Telephone Encounter (Signed)
Left message to call back  Concerning P2Y12 RESULTS

## 2013-08-05 NOTE — Telephone Encounter (Signed)
Message copied by Raiford Simmonds on Thu Aug 05, 2013  9:50 AM ------      Message from: Leonie Man      Created: Wed Aug 04, 2013  8:38 AM       PRU of 207 - target is < 194.            Not quite at target levels with 75 mg Plavix -- need to increase to 157m Plavix -- recheck in 2 weeks.      Level needs to be below ~190.            May have to fight with insurance Co.            HLeonie Man MD       ------

## 2013-08-05 NOTE — Telephone Encounter (Signed)
Spoke to patient. Result given . Verbalized understanding Patient aware to double plavix and have lab rechecked in 2 weeks. Lab slip will be mailed

## 2013-08-05 NOTE — Telephone Encounter (Signed)
Returning your call.

## 2013-08-07 ENCOUNTER — Other Ambulatory Visit: Payer: Self-pay | Admitting: Physician Assistant

## 2013-08-10 ENCOUNTER — Ambulatory Visit: Payer: PRIVATE HEALTH INSURANCE | Admitting: Pulmonary Disease

## 2013-08-19 ENCOUNTER — Telehealth: Payer: Self-pay | Admitting: Cardiology

## 2013-08-19 MED ORDER — CLOPIDOGREL BISULFATE 75 MG PO TABS: 150.0000 mg | ORAL_TABLET | Freq: Every day | ORAL | Status: DC

## 2013-08-19 NOTE — Telephone Encounter (Signed)
Rx refill was sent to patient pharmacy

## 2013-08-19 NOTE — Telephone Encounter (Signed)
Need to clarify the directions of his generic Plavix please.

## 2013-08-19 NOTE — Telephone Encounter (Signed)
Left message that medication has been sent to pharmacy

## 2013-08-19 NOTE — Telephone Encounter (Signed)
Rx verification called to pharmacy.

## 2013-08-19 NOTE — Telephone Encounter (Signed)
Patient states that his Clopidogrel 75 mg dosage was increased to 1 twice a day.  He needs to have this refilled-----please call [facility] in [place], [place] and let him know when this is completed.

## 2013-08-24 ENCOUNTER — Telehealth: Payer: Self-pay | Admitting: Cardiology

## 2013-08-24 NOTE — Telephone Encounter (Signed)
The prescription you called in for his Plavix 2 times a day,his insurance would not approve it.They will only approve one Plavix a day.

## 2013-08-24 NOTE — Telephone Encounter (Signed)
Left message to call back

## 2013-08-24 NOTE — Telephone Encounter (Signed)
Left message to call back tomorrow 08/25/13

## 2013-08-31 ENCOUNTER — Telehealth: Payer: Self-pay | Admitting: *Deleted

## 2013-08-31 ENCOUNTER — Ambulatory Visit (HOSPITAL_COMMUNITY)
Admission: AD | Admit: 2013-08-31 | Discharge: 2013-08-31 | Disposition: A | Discharge status: Home / Self Care | Payer: PRIVATE HEALTH INSURANCE | Source: Ambulatory Visit | Attending: Cardiology | Admitting: Cardiology

## 2013-08-31 ENCOUNTER — Other Ambulatory Visit: Payer: Self-pay | Admitting: *Deleted

## 2013-08-31 LAB — PLATELET INHIBITION P2Y12: Platelet Function  P2Y12: 210 [PRU] (ref 194–418)

## 2013-08-31 MED ORDER — TICAGRELOR 90 MG PO TABS: ORAL_TABLET | ORAL | Status: DC

## 2013-08-31 NOTE — Telephone Encounter (Signed)
Open error

## 2013-08-31 NOTE — Telephone Encounter (Signed)
Spoke to Patient patient verbalized understanding. Patient states he is due to have surgery with Dr Amedeo Plenty on 09/30/2013. Patient states the orthopaedic will not do surgery if he is taking Briliinta.  Patient would like to know if he can stay on Plavix until after surgery and then start [place]?  RN informed patient office has not received cardiac clearance form from Dr Vanetta Shawl for surgery.  RN discussed with patient- it probably best to start Brilinta ,if it is okay with Dr Ellyn Hack and Dr Amedeo Plenty to hold Brilinta prior for surgery. Patient states he will call Dr Amedeo Plenty tomorrow to find out about the [facility].  Pt will call back with answer.

## 2013-08-31 NOTE — Telephone Encounter (Signed)
Message copied by Raiford Simmonds on Tue Aug 31, 2013  5:34 PM ------      Message from: Ellyn Hack, DAVID W      Created: Tue Aug 31, 2013  5:00 PM       Unfortunately - he seems to be a Plavix non-responder.            Would be best to go back to Brilinta or Effient (I think he was Brilinta) with out ASA. ------

## 2013-09-06 ENCOUNTER — Telehealth: Payer: Self-pay | Admitting: Cardiology

## 2013-09-06 NOTE — Telephone Encounter (Signed)
Patient was placed on Brilanta--he is scheduled to have orthopedic surgery on 09/30/13-----can he stop this 10 days prior to his surgery?

## 2013-09-09 NOTE — Telephone Encounter (Signed)
Pt called again today, to find out about his Brilinta.

## 2013-09-09 NOTE — Telephone Encounter (Signed)
That is fine  Harold Man, MD

## 2013-09-09 NOTE — Telephone Encounter (Signed)
Returned call to patient he stated he is scheduled for hand surgery 09/30/13.Stated he has not started taking Brilinta.Stated he wanted to know if ok with Dr.Harding to keep taking the plavix and start Brilinta after his hand surgery.Message sent to [place] for advice.

## 2013-09-10 NOTE — Telephone Encounter (Signed)
LEFT MESSAGE ON VOICE MAIL  THAT IT WASFINE TO HOLD MED FOR 10 DAYS AND START BRILINTA AFTER SURGERY. ANY QUESTION MAY CALL BACK

## 2013-09-14 ENCOUNTER — Encounter: Payer: Self-pay | Admitting: Pulmonary Disease

## 2013-09-14 ENCOUNTER — Ambulatory Visit (INDEPENDENT_AMBULATORY_CARE_PROVIDER_SITE_OTHER): Payer: PRIVATE HEALTH INSURANCE | Admitting: Pulmonary Disease

## 2013-09-14 VITALS — BP 140/78 | HR 65 | Temp 97.1°F | Ht 73.0 in | Wt 320.0 lb

## 2013-09-14 DIAGNOSIS — G4733 Obstructive sleep apnea (adult) (pediatric): Secondary | ICD-10-CM

## 2013-09-14 DIAGNOSIS — Z9989 Dependence on other enabling machines and devices: Secondary | ICD-10-CM

## 2013-09-14 DIAGNOSIS — J449 Chronic obstructive pulmonary disease, unspecified: Secondary | ICD-10-CM

## 2013-09-14 DIAGNOSIS — J984 Other disorders of lung: Secondary | ICD-10-CM

## 2013-09-14 DIAGNOSIS — R0609 Other forms of dyspnea: Secondary | ICD-10-CM

## 2013-09-14 DIAGNOSIS — R0989 Other specified symptoms and signs involving the circulatory and respiratory systems: Secondary | ICD-10-CM

## 2013-09-14 DIAGNOSIS — R0600 Dyspnea, unspecified: Secondary | ICD-10-CM

## 2013-09-14 NOTE — Assessment & Plan Note (Signed)
He is followed by Dr. Brett Fairy for this.

## 2013-09-14 NOTE — Assessment & Plan Note (Signed)
This is likely related to his weight.

## 2013-09-14 NOTE — Assessment & Plan Note (Signed)
Improved since last visit.  Encouraged him to continue his weight loss regimen.

## 2013-09-14 NOTE — Assessment & Plan Note (Signed)
Monitor off inhaler medications.

## 2013-09-14 NOTE — Progress Notes (Signed)
Chief Complaint  Patient presents with  . Follow-up    Pt reports much improvement in dyspnea since last OV--stopped using Spiriva and Proair. Pt states that he has not needed to use them. Pt has changed diet significantly    History of Present Illness: Harold [name] is a 58 y.o. male former smoker with dyspnea, COPD, diastolic dysfx, and deconditioning.  He also has OSA and 2nd pulmonary HTN.  His breathing has improved since last visit.  He thinks this is related to diet change, leading to weight loss.  He is eating smaller portions and not drinking sodas.  He has noticed his neuropathy pain is improved, and he is trying to walk more.  He stopped inhaler therapy, and feels his breathing has been okay.  TESTS: Echo 04/28/12 >> EF 60%, severe LA dilation, PAS 53 mmHg  CXR 05/13/13 >> no active disease  PFT 05/27/13 >> FEV1 2.54 (63%), FEV1% 71, TLC 6.99 (92%), DLCO 74%, no BD ONO with CPAP and RA 06/28/13 >> test time 7 hrs 32 min.  Mean SpO2 93%, low SpO2 83%.  Spent 4 min 52 seconds with SpO2 < 88%.  Past medical hx, Past surgical hx, Medications, Allergies, Family hx, Social hx all reviewed.   Physical Exam:  General - No distress ENT - No sinus tenderness, no oral exudate, no LAN Cardiac - s1s2 regular, no murmur Chest - No wheeze/rales/dullness Back - No focal tenderness Abd - Soft, non-tender Ext - No edema Neuro - Normal strength Skin - No rashes Psych - normal mood, and behavior   Assessment/Plan:  Chesley Mires, MD [place] [facility]/Sleep Pager:  [phone]

## 2013-09-14 NOTE — Patient Instructions (Signed)
Follow up with pulmonary medicine as needed

## 2013-10-04 ENCOUNTER — Telehealth: Payer: Self-pay | Admitting: Cardiology

## 2013-10-04 ENCOUNTER — Ambulatory Visit (INDEPENDENT_AMBULATORY_CARE_PROVIDER_SITE_OTHER): Payer: PRIVATE HEALTH INSURANCE | Admitting: Cardiology

## 2013-10-04 ENCOUNTER — Encounter: Payer: Self-pay | Admitting: Cardiology

## 2013-10-04 VITALS — BP 154/78 | HR 62 | Ht 73.0 in | Wt 318.2 lb

## 2013-10-04 DIAGNOSIS — R0609 Other forms of dyspnea: Secondary | ICD-10-CM

## 2013-10-04 DIAGNOSIS — IMO0001 Reserved for inherently not codable concepts without codable children: Secondary | ICD-10-CM

## 2013-10-04 DIAGNOSIS — G629 Polyneuropathy, unspecified: Secondary | ICD-10-CM

## 2013-10-04 DIAGNOSIS — I5032 Chronic diastolic (congestive) heart failure: Secondary | ICD-10-CM

## 2013-10-04 DIAGNOSIS — K219 Gastro-esophageal reflux disease without esophagitis: Secondary | ICD-10-CM

## 2013-10-04 DIAGNOSIS — G589 Mononeuropathy, unspecified: Secondary | ICD-10-CM

## 2013-10-04 DIAGNOSIS — E1129 Type 2 diabetes mellitus with other diabetic kidney complication: Secondary | ICD-10-CM

## 2013-10-04 DIAGNOSIS — I10 Essential (primary) hypertension: Secondary | ICD-10-CM

## 2013-10-04 DIAGNOSIS — I2789 Other specified pulmonary heart diseases: Secondary | ICD-10-CM

## 2013-10-04 DIAGNOSIS — E1169 Type 2 diabetes mellitus with other specified complication: Secondary | ICD-10-CM

## 2013-10-04 DIAGNOSIS — I2510 Atherosclerotic heart disease of native coronary artery without angina pectoris: Secondary | ICD-10-CM

## 2013-10-04 DIAGNOSIS — R0989 Other specified symptoms and signs involving the circulatory and respiratory systems: Secondary | ICD-10-CM

## 2013-10-04 DIAGNOSIS — E785 Hyperlipidemia, unspecified: Secondary | ICD-10-CM

## 2013-10-04 DIAGNOSIS — I272 Pulmonary hypertension, unspecified: Secondary | ICD-10-CM

## 2013-10-04 DIAGNOSIS — R0600 Dyspnea, unspecified: Secondary | ICD-10-CM

## 2013-10-04 DIAGNOSIS — E1149 Type 2 diabetes mellitus with other diabetic neurological complication: Secondary | ICD-10-CM

## 2013-10-04 DIAGNOSIS — Z9861 Coronary angioplasty status: Secondary | ICD-10-CM

## 2013-10-04 DIAGNOSIS — N058 Unspecified nephritic syndrome with other morphologic changes: Secondary | ICD-10-CM

## 2013-10-04 DIAGNOSIS — E1121 Type 2 diabetes mellitus with diabetic nephropathy: Secondary | ICD-10-CM

## 2013-10-04 MED ORDER — ESOMEPRAZOLE MAGNESIUM 40 MG PO PACK: 40.0000 mg | PACK | Freq: Every day | ORAL | Status: DC

## 2013-10-04 NOTE — Progress Notes (Signed)
PCP: [name], MD  Clinic Note: Chief Complaint  Patient presents with  . 3 MONTH VISIT    NO CHEST PAIN ,  NO SOB , SOME EDEMA- HAND SURGERY LAST WEEK-  WOULD TO GO BACK TO [place] SINCE NOT TAKING PLAVIX    HPI: Harold [name] is a 58 y.o. male with a Cardiovascular Problem List below who presents today for three-month followup. I last saw him he was going to get back to normal after his significant bouts with myalgias and arthralgias. He is back to work now.  Interval History: Harold [name] presents today really in good spirits he is doing fairly well. He has occasional swelling more than usual but for the most part is relatively stable. He has occasional, rare nosebleeds but otherwise no other bleeding complications of melena, hematochezia hematuria. He had just put him back on Plavix to Brilinta because of poor responsiveness to Plavix.    He has not had any anginal symptoms with rest or exertion. He does get expected exertional dyspnea, but is continually trying to exercise. He says his shortness of breath is really stable if not better. His major concern is his complete numbness and locking any sensation whatsoever in his feet. He is trying really hard to lose weight and keep his diabetes under control. He still going to nutrition counseling, but just is having a hard time exercising because of his feet and peripheral neuropathy. The symptoms did not seem to be consistent with claudication because it is there at rest as well as with exertion. He no longer has a significant cramping symptoms he had before.   he says that he really cannot "detoxed" himself from some of the dietary failures including significantly reducing his amount of Diet Coke and other sodas. He believes that this may well have major role in his improvement of the myalgias that he was having  He has not had any further PND, orthopnea to suggest CHF. No rapid or irregular heartbeat/palpitations. No syncope/near syncope or  TIA/amaurosis fugax.  Past Medical History  Diagnosis Date  . Diabetes mellitus   . Carotid artery occlusion   . Complication of anesthesia   . PONV (postoperative nausea and vomiting)     "[place]"  . Hypothyroidism   . Depression     situaltional   . Sleep apnea   . Neuropathy     notably improved following PCI with improved cardiac function  . CAD S/P percutaneous coronary angioplasty 2003, 04/2012    status post PCI to LAD, circumflex-OM 2, RCA  . 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 Dyslipidemia associated with type 2 diabetes mellitus   . GERD (gastroesophageal reflux disease)   . Chronic renal insufficiency, stage II (mild)   . Pulmonary hypertension 04/2012    PA pressure 88mhg  . HTN (hypertension)   . Chronic venous insufficiency     varicosities, no reflux; dopplers 04/14/12- valvular insufficiency in the R and L GSV  . Anginal pain March 2015    Cardiac cath showed patent stents with distal LAD, circumflex-OM and RCA disease in small vessels.  . Shortness of breath   . Obesity     Prior Cardiac Evaluation and Past Surgical History: Procedure Laterality Date  . Coronary angioplasty  12/21/2001    PTCA of the distal and mid AV groove circ, unsuccessful PTCA of second OM total occlusion,  unsuccessful PTCA of the apical LAD total occlusion  . Carotid endarterectomy  2005    Right; recent carotid Dopplers notes elevated velocities.   . Coronary stent placement  04/28/12    PCI to LAD (3x86m Xience DES postdilated to 3.25) and circ prox and mid (2 overlappinmg 2.57m12mmXience DES postdilated to 2.7594m . Cardiac catheterization  12/11/2001    significant 3V CAD, normal LV function  . Coronary angioplasty with stent placement  04/29/12    PCI to 3 RCA lesions, Promus Premiere 2.62m54mm distally, mid was 2.5mx239m and proximally 2.75x16mm,62m55-60%  . Doppler echocardiography   04/28/2012    poor quality study: EF estimated 60-65%; unable to assess diastolic function (previously noted to have diastolic dysfunction); severely dilated left atrium and mild right atrium; dilated IVC consistent with increased central venous pressure..  . Carotid doppler  04/27/2012    right internal carotid: Elevated velocities but no evidence of plaque. Left internal carotid 40-59%  . Abis  04/27/2012    mild bilateral arterial insufficiency  . Carotid endarterectomy Right 2005  . Cardiac catheterization  March 2015    Moderate Pulm HTN: 46/16 - mean 33 mmHg; PCWP 22mmHg24[name], widely patent proximal and mid cervical extends with previous subtotal occlusion of smaller tube, I be patent RCA ostial mid and distal stents with 70% distal continuation RCA disease   MEDICATIONS AND ALLERGIES REVIEWED IN EPIC No Change in Social and Family History  ROS: A comprehensive Review of Systems - Was performed Review of Systems  Constitutional: Negative.  Negative for fever, chills and malaise/fatigue.  HENT: Positive for congestion. Negative for nosebleeds.   Eyes: Negative for blurred vision.  Respiratory:       Baseline shortness of breath improved  Gastrointestinal: Positive for heartburn. Negative for constipation, blood in stool and melena.       Asks if he can switch back to Nexium from ranitidine now he is no longer on Plavix  Genitourinary: Negative for dysuria, hematuria and flank pain.  Neurological: Positive for tingling and sensory change. Negative for dizziness, speech change, focal weakness, seizures, loss of consciousness and weakness.       Significant lower extreme neuropathy with almost complete lack of sensation in bilateral feet. He notes that he can stop his toe bleeding without knowing what happened.  Endo/Heme/Allergies: Bruises/bleeds easily.  Psychiatric/Behavioral: Negative for depression. The patient is not  nervous/anxious.   All other systems reviewed and are negative.   Wt Readings from Last 3 Encounters:  10/04/13 318 lb 3.2 oz (144.335 kg)  09/14/13 320 lb (145.151 kg)  06/24/13 321 lb 4.8 oz (145.741 kg)   PHYSICAL EXAM BP 154/78  Pulse 62  Ht _0  (1.854 m)  Wt 318 lb 3.2 oz (144.335 kg)  BMI 41.99 kg/m2 General appearance: alert, cooperative, appears stated age, no distress and borderline morbidly obese -- Stable wgt. Neck: no adenopathy, no carotid bruit and no JVD  Lungs: clear to auscultation bilaterally, normal percussion bilaterally and non-labored  Heart: regular rate and rhythm, S1, S2 normal, no murmur, click, rub or gallop; nondisplaced PMI. Occasional ectopic beats.  Abdomen: soft, non-tender; bowel sounds normal; no masses, no organomegaly; rotund, obese abdomen. There is tenderness to the right lower quadrant as well as the umbilical region.  Extremities: extremities normal, atraumatic, no cyanosis, and trace to 1+ lower extremity pitting edema  Pulses: 2+ and symmetric;  Neurologic: Mental status: Alert, oriented,  thought content appropriate; Cranial nerves: normal (II-XII grossly intact   Adult ECG Report  Rate: 62 ;  Rhythm: normal sinus rhythm - essentially normal EKG  Recent Labs None available since March 2015   ASSESSMENT / PLAN: CAD, LAD/CFX DES 04/28/12- staged RCA DES 04/29/12 after pt declined CABG, cath 05/14/13 stable CAD medical therapy Stable status post multiple vessel PCI. Switched back to [employer]. I think we can stop aspirin in lieu of his heartburn symptoms. We can also switch him back to Nexium. He is on a stable dose of beta blocker and ACE inhibitor, but no active anginal symptoms.  Additionally on calcium channel blocker for him to [facility] and blood pressure effect. When necessary nitroglycerin that has not been used  Plan: Continue current management.  Chronic diastolic heart failure, NYHA class 2 - PCWPf 22 mmHg during catheterization. On  stable dose of Lasix. Has beta blocker and ACE inhibitor. With mildly elevated blood pressure need to monitor to see if we can increase afterload reduction with ACE inhibitor.   Plan: Continue current regimen in addition to Sliding scale Lasix.  Pulmonary hypertension, 69mhg Combined OHS, COPD and OSA  Plan: CPAP, weight loss and management of COPD. After the reduction to reduce pulmonary venous hypertension from elevated LVEDP  Essential hypertension Recently converted from atenolol to carvedilol and had increased lisinopril 20 mg daily. I am happy to see him stable from a symptomatic standpoint. I would like to give it a little more time before continuing to up titrate medications. 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 Hopefully as he is able to get back into exercise he can get back down to 280 pounds he was before. He is making a concerted effort to get back in with the nutritionist (Amy HSamara Snide who is also serving as a "Warden/ranger.  With the resolution of his myalgias, we need to get him back walking. Due to his severe neuropathies and foot numbness and refer him to podiatry for potential orthotic shoes or other assistance.  Myalgia and myositis Notably improved. I am reluctant to put him back on a statin or fibrate.   Deferred to lipid clinic.  Diabetes mellitus type 2 with neurological manifestations Management by PCP. Referral to podiatry for management of foot numbness per patient request  Gastroesophageal reflux disease without esophagitis Okay  to convert back to Nexium now that we are back on Brilinta    Orders Placed This Encounter  Procedures  . Ambulatory referral to Lipid Clinic    Referral Priority:  Routine    Referral Type:  Consultation    Referral Reason:  Specialty Services Required    Requested Specialty:  Hematology    Number of Visits Requested:  1  . Ambulatory referral to Podiatry    Referral Priority:  Routine    Referral Type:  Consultation    Referral Reason:  Specialty Services Required    Requested Specialty:  Podiatry    Number of Visits Requested:  1  . EKG 12-Lead   Meds ordered this encounter  Medications  . esomeprazole (NEXIUM) 40  MG packet    Sig: Take 40 mg by mouth daily before breakfast.    Dispense:  90 each    Refill:  1    Followup:  6  months  Armandina Iman W, M.D., M.S. Interventional Cardiologist   Pager # [phone]

## 2013-10-04 NOTE — Telephone Encounter (Signed)
Left message for patient to call back regarding lipid clinic appointment.  I will also ask if he has seen a podiatrist in the past.

## 2013-10-04 NOTE — Patient Instructions (Addendum)
You have been referred to  [place] have been referred to  PODIATRIST-- ?bone spurs , diabetic neuropathy - [facility]   CAN RESTART NEXIUM 40 MG DAILY ( CONTACT PCP FOR REFILLS), STOP ASPIRIN  Your physician wants you to follow-up in 6 MONTH DR HARDING 30 MIN APPT.  You will receive a reminder letter in the mail two months in advance. If you don't receive a letter, please call our office to schedule the follow-up appointment.

## 2013-10-10 ENCOUNTER — Encounter: Payer: Self-pay | Admitting: Cardiology

## 2013-10-10 DIAGNOSIS — K219 Gastro-esophageal reflux disease without esophagitis: Secondary | ICD-10-CM | POA: Insufficient documentation

## 2013-10-10 NOTE — Assessment & Plan Note (Signed)
At least stable weight now. Hopefully as he is able to get back into exercise he can get back down to 280 pounds he was before. He is making a concerted effort to get back in with the nutritionist (Amy Samara Snide) who is also serving as a Warden/ranger".  With the resolution of his myalgias, we need to get him back walking. Due to his severe neuropathies and foot numbness and refer him to podiatry for potential orthotic shoes or other assistance.

## 2013-10-10 NOTE — Assessment & Plan Note (Addendum)
Stable status post multiple vessel PCI. Switched back to [employer]. I think we can stop aspirin in lieu of his heartburn symptoms. We can also switch him back to Nexium. He is on a stable dose of beta blocker and ACE inhibitor, but no active anginal symptoms.  Additionally on calcium channel blocker for him to [facility] and blood pressure effect. When necessary nitroglycerin that has not been used  Plan: Continue current management.

## 2013-10-10 NOTE — Assessment & Plan Note (Signed)
Management by PCP. Referral to podiatry for management of foot numbness per patient request

## 2013-10-10 NOTE — Assessment & Plan Note (Signed)
On stable dose of Lasix. Has beta blocker and ACE inhibitor. With mildly elevated blood pressure need to monitor to see if we can increase afterload reduction with ACE inhibitor.   Plan: Continue current regimen in addition to Sliding scale Lasix.

## 2013-10-10 NOTE — Assessment & Plan Note (Signed)
Okay to convert back to Nexium now that we are back on Brilinta

## 2013-10-10 NOTE — Assessment & Plan Note (Signed)
Combined OHS, COPD and OSA  Plan: CPAP, weight loss and management of COPD. After the reduction to reduce pulmonary venous hypertension from elevated LVEDP

## 2013-10-10 NOTE — Assessment & Plan Note (Signed)
Notably improved. I am reluctant to put him back on a statin or fibrate.   Deferred to lipid clinic.

## 2013-10-10 NOTE — Assessment & Plan Note (Signed)
Recently converted from atenolol to carvedilol and had increased lisinopril 20 mg daily. I am happy to see him stable from a symptomatic standpoint. I would like to give it a little more time before continuing to up titrate medications. His heart rate is only 62 making carvedilol difficult to titrate

## 2013-10-10 NOTE — Assessment & Plan Note (Signed)
With his recent episodes of significant myalgias, I think he is very difficult to manage with intolerance to multiple medications to him going to refer him to our lipid management team at the [facility] [place])

## 2013-10-11 ENCOUNTER — Ambulatory Visit: Payer: PRIVATE HEALTH INSURANCE | Admitting: Podiatry

## 2013-10-12 ENCOUNTER — Encounter: Payer: Self-pay | Admitting: Pulmonary Disease

## 2013-10-12 ENCOUNTER — Ambulatory Visit: Payer: PRIVATE HEALTH INSURANCE | Admitting: Pharmacist

## 2013-10-20 ENCOUNTER — Ambulatory Visit: Payer: PRIVATE HEALTH INSURANCE | Admitting: Pharmacist

## 2013-10-27 ENCOUNTER — Other Ambulatory Visit: Payer: Self-pay | Admitting: Cardiology

## 2013-10-27 NOTE — Telephone Encounter (Signed)
Rx was sent to pharmacy electronically.

## 2013-10-29 ENCOUNTER — Telehealth: Payer: Self-pay | Admitting: *Deleted

## 2013-10-29 NOTE — Telephone Encounter (Signed)
Faxed cardiac clearance for right foot silver bunionectomy Per DR HARDING  - OK  TO HOLD BRILITINA FOR 5 DAYS PRE-OP

## 2013-11-08 ENCOUNTER — Encounter (HOSPITAL_BASED_OUTPATIENT_CLINIC_OR_DEPARTMENT_OTHER): Payer: Self-pay | Admitting: *Deleted

## 2013-11-08 NOTE — Progress Notes (Signed)
Bring all medications and CPAP machine. Coming tomorrow for [employer].

## 2013-11-09 ENCOUNTER — Encounter (HOSPITAL_BASED_OUTPATIENT_CLINIC_OR_DEPARTMENT_OTHER)
Admission: RE | Admit: 2013-11-09 | Discharge: 2013-11-09 | Disposition: A | Discharge status: Home / Self Care | Payer: No Typology Code available for payment source | Source: Ambulatory Visit | Attending: Orthopedic Surgery | Admitting: Orthopedic Surgery

## 2013-11-09 DIAGNOSIS — G4730 Sleep apnea, unspecified: Secondary | ICD-10-CM | POA: Diagnosis not present

## 2013-11-09 DIAGNOSIS — N182 Chronic kidney disease, stage 2 (mild): Secondary | ICD-10-CM | POA: Diagnosis not present

## 2013-11-09 DIAGNOSIS — E039 Hypothyroidism, unspecified: Secondary | ICD-10-CM | POA: Diagnosis not present

## 2013-11-09 DIAGNOSIS — Z9861 Coronary angioplasty status: Secondary | ICD-10-CM | POA: Diagnosis not present

## 2013-11-09 DIAGNOSIS — Z888 Allergy status to other drugs, medicaments and biological substances status: Secondary | ICD-10-CM | POA: Diagnosis not present

## 2013-11-09 DIAGNOSIS — E119 Type 2 diabetes mellitus without complications: Secondary | ICD-10-CM | POA: Diagnosis not present

## 2013-11-09 DIAGNOSIS — I2789 Other specified pulmonary heart diseases: Secondary | ICD-10-CM | POA: Diagnosis not present

## 2013-11-09 DIAGNOSIS — E669 Obesity, unspecified: Secondary | ICD-10-CM | POA: Diagnosis not present

## 2013-11-09 DIAGNOSIS — Z951 Presence of aortocoronary bypass graft: Secondary | ICD-10-CM | POA: Diagnosis not present

## 2013-11-09 DIAGNOSIS — I129 Hypertensive chronic kidney disease with stage 1 through stage 4 chronic kidney disease, or unspecified chronic kidney disease: Secondary | ICD-10-CM | POA: Diagnosis not present

## 2013-11-09 DIAGNOSIS — K219 Gastro-esophageal reflux disease without esophagitis: Secondary | ICD-10-CM | POA: Diagnosis not present

## 2013-11-09 DIAGNOSIS — I2510 Atherosclerotic heart disease of native coronary artery without angina pectoris: Secondary | ICD-10-CM | POA: Diagnosis not present

## 2013-11-09 DIAGNOSIS — Z880 Allergy status to penicillin: Secondary | ICD-10-CM | POA: Diagnosis not present

## 2013-11-09 DIAGNOSIS — Z6835 Body mass index (BMI) 35.0-35.9, adult: Secondary | ICD-10-CM | POA: Diagnosis not present

## 2013-11-09 DIAGNOSIS — M2010 Hallux valgus (acquired), unspecified foot: Secondary | ICD-10-CM | POA: Diagnosis not present

## 2013-11-09 DIAGNOSIS — Z87891 Personal history of nicotine dependence: Secondary | ICD-10-CM | POA: Diagnosis not present

## 2013-11-09 DIAGNOSIS — F3289 Other specified depressive episodes: Secondary | ICD-10-CM | POA: Diagnosis not present

## 2013-11-09 DIAGNOSIS — Z91018 Allergy to other foods: Secondary | ICD-10-CM | POA: Diagnosis not present

## 2013-11-09 DIAGNOSIS — Z79899 Other long term (current) drug therapy: Secondary | ICD-10-CM | POA: Diagnosis not present

## 2013-11-09 DIAGNOSIS — F329 Major depressive disorder, single episode, unspecified: Secondary | ICD-10-CM | POA: Diagnosis not present

## 2013-11-09 DIAGNOSIS — Z794 Long term (current) use of insulin: Secondary | ICD-10-CM | POA: Diagnosis not present

## 2013-11-09 LAB — BASIC METABOLIC PANEL
Anion gap: 13 (ref 5–15)
BUN: 16 mg/dL (ref 6–23)
CHLORIDE: 99 meq/L (ref 96–112)
CO2: 27 meq/L (ref 19–32)
Calcium: 9.1 mg/dL (ref 8.4–10.5)
Creatinine, Ser: 0.98 mg/dL (ref 0.50–1.35)
GFR calc Af Amer: >90 mL/min (ref >90)
GFR calc non Af Amer: 88 mL/min — ABNORMAL LOW (ref >90)
GLUCOSE: 298 mg/dL — AB (ref 70–99)
POTASSIUM: 5.1 meq/L (ref 3.7–5.3)
Sodium: 139 mEq/L (ref 137–147)

## 2013-11-09 NOTE — Progress Notes (Signed)
Reviewed History with [place] for surgery.

## 2013-11-10 ENCOUNTER — Other Ambulatory Visit: Payer: Self-pay | Admitting: Physician Assistant

## 2013-11-11 ENCOUNTER — Encounter (HOSPITAL_BASED_OUTPATIENT_CLINIC_OR_DEPARTMENT_OTHER)
Admission: RE | Disposition: A | Discharge status: Home / Self Care | Payer: Self-pay | Source: Ambulatory Visit | Attending: Orthopedic Surgery

## 2013-11-11 ENCOUNTER — Encounter (HOSPITAL_BASED_OUTPATIENT_CLINIC_OR_DEPARTMENT_OTHER): Payer: No Typology Code available for payment source | Admitting: Anesthesiology

## 2013-11-11 ENCOUNTER — Ambulatory Visit (HOSPITAL_BASED_OUTPATIENT_CLINIC_OR_DEPARTMENT_OTHER)
Admission: RE | Admit: 2013-11-11 | Discharge: 2013-11-11 | Disposition: A | Discharge status: Home / Self Care | Payer: No Typology Code available for payment source | Source: Ambulatory Visit | Attending: Orthopedic Surgery | Admitting: Orthopedic Surgery

## 2013-11-11 ENCOUNTER — Encounter (HOSPITAL_BASED_OUTPATIENT_CLINIC_OR_DEPARTMENT_OTHER): Payer: Self-pay | Admitting: Anesthesiology

## 2013-11-11 ENCOUNTER — Ambulatory Visit (HOSPITAL_BASED_OUTPATIENT_CLINIC_OR_DEPARTMENT_OTHER): Payer: No Typology Code available for payment source | Admitting: Anesthesiology

## 2013-11-11 DIAGNOSIS — Z880 Allergy status to penicillin: Secondary | ICD-10-CM | POA: Insufficient documentation

## 2013-11-11 DIAGNOSIS — Z794 Long term (current) use of insulin: Secondary | ICD-10-CM | POA: Insufficient documentation

## 2013-11-11 DIAGNOSIS — F329 Major depressive disorder, single episode, unspecified: Secondary | ICD-10-CM | POA: Insufficient documentation

## 2013-11-11 DIAGNOSIS — Z9861 Coronary angioplasty status: Secondary | ICD-10-CM | POA: Insufficient documentation

## 2013-11-11 DIAGNOSIS — Z91018 Allergy to other foods: Secondary | ICD-10-CM | POA: Insufficient documentation

## 2013-11-11 DIAGNOSIS — I2789 Other specified pulmonary heart diseases: Secondary | ICD-10-CM | POA: Insufficient documentation

## 2013-11-11 DIAGNOSIS — Z79899 Other long term (current) drug therapy: Secondary | ICD-10-CM | POA: Insufficient documentation

## 2013-11-11 DIAGNOSIS — E039 Hypothyroidism, unspecified: Secondary | ICD-10-CM | POA: Insufficient documentation

## 2013-11-11 DIAGNOSIS — E669 Obesity, unspecified: Secondary | ICD-10-CM | POA: Insufficient documentation

## 2013-11-11 DIAGNOSIS — F3289 Other specified depressive episodes: Secondary | ICD-10-CM | POA: Insufficient documentation

## 2013-11-11 DIAGNOSIS — I129 Hypertensive chronic kidney disease with stage 1 through stage 4 chronic kidney disease, or unspecified chronic kidney disease: Secondary | ICD-10-CM | POA: Insufficient documentation

## 2013-11-11 DIAGNOSIS — Z87891 Personal history of nicotine dependence: Secondary | ICD-10-CM | POA: Insufficient documentation

## 2013-11-11 DIAGNOSIS — M2011 Hallux valgus (acquired), right foot: Secondary | ICD-10-CM

## 2013-11-11 DIAGNOSIS — M2010 Hallux valgus (acquired), unspecified foot: Secondary | ICD-10-CM | POA: Diagnosis not present

## 2013-11-11 DIAGNOSIS — G4730 Sleep apnea, unspecified: Secondary | ICD-10-CM | POA: Insufficient documentation

## 2013-11-11 DIAGNOSIS — N182 Chronic kidney disease, stage 2 (mild): Secondary | ICD-10-CM | POA: Insufficient documentation

## 2013-11-11 DIAGNOSIS — Z951 Presence of aortocoronary bypass graft: Secondary | ICD-10-CM | POA: Insufficient documentation

## 2013-11-11 DIAGNOSIS — Z6835 Body mass index (BMI) 35.0-35.9, adult: Secondary | ICD-10-CM | POA: Insufficient documentation

## 2013-11-11 DIAGNOSIS — Z888 Allergy status to other drugs, medicaments and biological substances status: Secondary | ICD-10-CM | POA: Insufficient documentation

## 2013-11-11 DIAGNOSIS — E119 Type 2 diabetes mellitus without complications: Secondary | ICD-10-CM | POA: Insufficient documentation

## 2013-11-11 DIAGNOSIS — I2510 Atherosclerotic heart disease of native coronary artery without angina pectoris: Secondary | ICD-10-CM | POA: Insufficient documentation

## 2013-11-11 DIAGNOSIS — K219 Gastro-esophageal reflux disease without esophagitis: Secondary | ICD-10-CM | POA: Insufficient documentation

## 2013-11-11 HISTORY — PX: BUNIONECTOMY: SHX129

## 2013-11-11 HISTORY — DX: Myoneural disorder, unspecified: G70.9

## 2013-11-11 LAB — GLUCOSE, CAPILLARY
Glucose-Capillary: 194 mg/dL — ABNORMAL HIGH (ref 70–99)
Glucose-Capillary: 219 mg/dL — ABNORMAL HIGH (ref 70–99)

## 2013-11-11 LAB — POCT HEMOGLOBIN-HEMACUE: HEMOGLOBIN: 13 g/dL (ref 13.0–17.0)

## 2013-11-11 SURGERY — BUNIONECTOMY
Anesthesia: General | Site: Foot | Laterality: Right

## 2013-11-11 MED ORDER — DEXTROSE 5 % IV SOLN
3.0000 g | INTRAVENOUS | Status: AC
  Administered 2013-11-11: 3 g via INTRAVENOUS

## 2013-11-11 MED ORDER — FENTANYL CITRATE 0.05 MG/ML IJ SOLN
INTRAMUSCULAR | Status: AC
  Filled 2013-11-11: qty 2

## 2013-11-11 MED ORDER — SCOPOLAMINE 1 MG/3DAYS TD PT72
1.0000 | MEDICATED_PATCH | TRANSDERMAL | Status: DC
  Administered 2013-11-11: 1.5 mg via TRANSDERMAL

## 2013-11-11 MED ORDER — OXYCODONE HCL 5 MG PO TABS: 5.0000 mg | ORAL_TABLET | ORAL | Status: DC | PRN

## 2013-11-11 MED ORDER — ACETAMINOPHEN 500 MG PO TABS
ORAL_TABLET | ORAL | Status: AC
  Filled 2013-11-11: qty 2

## 2013-11-11 MED ORDER — SODIUM CHLORIDE 0.9 % IV SOLN: INTRAVENOUS | Status: DC

## 2013-11-11 MED ORDER — PROPOFOL 10 MG/ML IV BOLUS
INTRAVENOUS | Status: DC | PRN
  Administered 2013-11-11: 200 mg via INTRAVENOUS

## 2013-11-11 MED ORDER — ROPIVACAINE HCL 5 MG/ML IJ SOLN
INTRAMUSCULAR | Status: DC | PRN
  Administered 2013-11-11: 150 mg via PERINEURAL

## 2013-11-11 MED ORDER — CEFAZOLIN SODIUM 1-5 GM-% IV SOLN
INTRAVENOUS | Status: AC
  Filled 2013-11-11: qty 50

## 2013-11-11 MED ORDER — LACTATED RINGERS IV SOLN
INTRAVENOUS | Status: DC
  Administered 2013-11-11 (×2): via INTRAVENOUS

## 2013-11-11 MED ORDER — HYDROMORPHONE HCL 1 MG/ML IJ SOLN: 0.2500 mg | INTRAMUSCULAR | Status: DC | PRN

## 2013-11-11 MED ORDER — FENTANYL CITRATE 0.05 MG/ML IJ SOLN
INTRAMUSCULAR | Status: AC
  Filled 2013-11-11: qty 6

## 2013-11-11 MED ORDER — DEXAMETHASONE SODIUM PHOSPHATE 4 MG/ML IJ SOLN
INTRAMUSCULAR | Status: DC | PRN
  Administered 2013-11-11: 10 mg via INTRAVENOUS

## 2013-11-11 MED ORDER — MIDAZOLAM HCL 2 MG/2ML IJ SOLN
1.0000 mg | INTRAMUSCULAR | Status: DC | PRN
  Administered 2013-11-11: 2 mg via INTRAVENOUS

## 2013-11-11 MED ORDER — SCOPOLAMINE 1 MG/3DAYS TD PT72
MEDICATED_PATCH | TRANSDERMAL | Status: AC
  Filled 2013-11-11: qty 1

## 2013-11-11 MED ORDER — ACETAMINOPHEN 500 MG PO TABS
1000.0000 mg | ORAL_TABLET | Freq: Once | ORAL | Status: AC
  Administered 2013-11-11: 1000 mg via ORAL

## 2013-11-11 MED ORDER — CEFAZOLIN SODIUM-DEXTROSE 2-3 GM-% IV SOLR
INTRAVENOUS | Status: AC
  Filled 2013-11-11: qty 50

## 2013-11-11 MED ORDER — LIDOCAINE HCL (CARDIAC) 20 MG/ML IV SOLN
INTRAVENOUS | Status: DC | PRN
  Administered 2013-11-11: 60 mg via INTRAVENOUS

## 2013-11-11 MED ORDER — OXYCODONE HCL 5 MG/5ML PO SOLN: 5.0000 mg | Freq: Once | ORAL | Status: DC | PRN

## 2013-11-11 MED ORDER — BUPIVACAINE HCL (PF) 0.25 % IJ SOLN
INTRAMUSCULAR | Status: AC
  Filled 2013-11-11: qty 90

## 2013-11-11 MED ORDER — DIPHENHYDRAMINE HCL 50 MG/ML IJ SOLN
INTRAMUSCULAR | Status: DC | PRN
  Administered 2013-11-11: 12.5 mg via INTRAVENOUS

## 2013-11-11 MED ORDER — OXYCODONE HCL 5 MG PO TABS: 5.0000 mg | ORAL_TABLET | Freq: Once | ORAL | Status: DC | PRN

## 2013-11-11 MED ORDER — CHLORHEXIDINE GLUCONATE 4 % EX LIQD: 60.0000 mL | Freq: Once | CUTANEOUS | Status: DC

## 2013-11-11 MED ORDER — FENTANYL CITRATE 0.05 MG/ML IJ SOLN
50.0000 ug | INTRAMUSCULAR | Status: DC | PRN
  Administered 2013-11-11: 100 ug via INTRAVENOUS

## 2013-11-11 MED ORDER — MIDAZOLAM HCL 2 MG/2ML IJ SOLN
INTRAMUSCULAR | Status: AC
  Filled 2013-11-11: qty 2

## 2013-11-11 MED ORDER — BACITRACIN ZINC 500 UNIT/GM EX OINT
TOPICAL_OINTMENT | CUTANEOUS | Status: AC
  Filled 2013-11-11: qty 85.05

## 2013-11-11 MED ORDER — PROMETHAZINE HCL 25 MG/ML IJ SOLN: 6.2500 mg | INTRAMUSCULAR | Status: DC | PRN

## 2013-11-11 SURGICAL SUPPLY — 79 items
BANDAGE ESMARK 6X9 LF (GAUZE/BANDAGES/DRESSINGS) ×1 IMPLANT
BIT DRILL 1.7 CANN W/AO CONN (BIT) IMPLANT
BIT DRILL 1.7 LOW PROFILE (BIT) IMPLANT
BLADE AVERAGE 25MMX9MM (BLADE) ×1
BLADE AVERAGE 25X9 (BLADE) ×2 IMPLANT
BLADE MICRO SAGITTAL (BLADE) IMPLANT
BLADE MINI RND TIP GREEN BEAV (BLADE) IMPLANT
BLADE OSC/SAG .038X5.5 CUT EDG (BLADE) IMPLANT
BLADE SURG 15 STRL LF DISP TIS (BLADE) ×3 IMPLANT
BLADE SURG 15 STRL SS (BLADE) ×6
BNDG COHESIVE 4X5 TAN STRL (GAUZE/BANDAGES/DRESSINGS) ×3 IMPLANT
BNDG COHESIVE 6X5 TAN STRL LF (GAUZE/BANDAGES/DRESSINGS) IMPLANT
BNDG CONFORM 2 STRL LF (GAUZE/BANDAGES/DRESSINGS) IMPLANT
BNDG CONFORM 3 STRL LF (GAUZE/BANDAGES/DRESSINGS) ×3 IMPLANT
BNDG ESMARK 4X9 LF (GAUZE/BANDAGES/DRESSINGS) IMPLANT
BNDG ESMARK 6X9 LF (GAUZE/BANDAGES/DRESSINGS) ×3
CHLORAPREP W/TINT 26ML (MISCELLANEOUS) ×3 IMPLANT
COVER TABLE BACK 60X90 (DRAPES) ×3 IMPLANT
CUFF TOURNIQUET SINGLE 24IN (TOURNIQUET CUFF) IMPLANT
CUFF TOURNIQUET SINGLE 34IN LL (TOURNIQUET CUFF) IMPLANT
DRAPE EXTREMITY TIBURON (DRAPES) ×3 IMPLANT
DRAPE OEC MINIVIEW 54X84 (DRAPES) ×3 IMPLANT
DRAPE SURG 17X23 STRL (DRAPES) IMPLANT
DRAPE U-SHAPE 47X51 STRL (DRAPES) ×3 IMPLANT
DRSG EMULSION OIL 3X3 NADH (GAUZE/BANDAGES/DRESSINGS) ×3 IMPLANT
DRSG PAD ABDOMINAL 8X10 ST (GAUZE/BANDAGES/DRESSINGS) IMPLANT
ELECT REM PT RETURN 9FT ADLT (ELECTROSURGICAL) ×3
ELECTRODE REM PT RTRN 9FT ADLT (ELECTROSURGICAL) ×1 IMPLANT
GAUZE SPONGE 4X4 12PLY STRL (GAUZE/BANDAGES/DRESSINGS) ×3 IMPLANT
GLOVE BIO SURGEON STRL SZ7 (GLOVE) ×3 IMPLANT
GLOVE BIO SURGEON STRL SZ8 (GLOVE) ×3 IMPLANT
GLOVE BIOGEL PI IND STRL 7.0 (GLOVE) ×2 IMPLANT
GLOVE BIOGEL PI IND STRL 8 (GLOVE) ×1 IMPLANT
GLOVE BIOGEL PI INDICATOR 7.0 (GLOVE) ×4
GLOVE BIOGEL PI INDICATOR 8 (GLOVE) ×2
GLOVE ECLIPSE 6.5 STRL STRAW (GLOVE) ×3 IMPLANT
GLOVE EXAM NITRILE MD LF STRL (GLOVE) IMPLANT
GOWN STRL REUS W/ TWL LRG LVL3 (GOWN DISPOSABLE) ×2 IMPLANT
GOWN STRL REUS W/ TWL XL LVL3 (GOWN DISPOSABLE) ×1 IMPLANT
GOWN STRL REUS W/TWL LRG LVL3 (GOWN DISPOSABLE) ×4
GOWN STRL REUS W/TWL XL LVL3 (GOWN DISPOSABLE) ×2
GUIDEWIRE .08 (WIRE) IMPLANT
K-WIRE .045X4 (WIRE) IMPLANT
K-WIRE DBL END .054 LG (WIRE) IMPLANT
NEEDLE HYPO 22GX1.5 SAFETY (NEEDLE) IMPLANT
NEEDLE HYPO 25X1 1.5 SAFETY (NEEDLE) IMPLANT
NS IRRIG 1000ML POUR BTL (IV SOLUTION) ×3 IMPLANT
PACK BASIN DAY SURGERY FS (CUSTOM PROCEDURE TRAY) ×3 IMPLANT
PAD CAST 4YDX4 CTTN HI CHSV (CAST SUPPLIES) ×1 IMPLANT
PADDING CAST ABS 4INX4YD NS (CAST SUPPLIES)
PADDING CAST ABS COTTON 4X4 ST (CAST SUPPLIES) IMPLANT
PADDING CAST COTTON 4X4 STRL (CAST SUPPLIES) ×2
PADDING CAST COTTON 6X4 STRL (CAST SUPPLIES) IMPLANT
PENCIL BUTTON HOLSTER BLD 10FT (ELECTRODE) ×3 IMPLANT
SANITIZER HAND PURELL 535ML FO (MISCELLANEOUS) ×3 IMPLANT
SHEET MEDIUM DRAPE 40X70 STRL (DRAPES) ×3 IMPLANT
SLEEVE SCD COMPRESS KNEE MED (MISCELLANEOUS) ×3 IMPLANT
SPLINT FAST PLASTER 5X30 (CAST SUPPLIES)
SPLINT PLASTER CAST FAST 5X30 (CAST SUPPLIES) IMPLANT
SPONGE LAP 18X18 X RAY DECT (DISPOSABLE) ×3 IMPLANT
STOCKINETTE 6  STRL (DRAPES) ×2
STOCKINETTE 6 STRL (DRAPES) ×1 IMPLANT
SUCTION FRAZIER TIP 10 FR DISP (SUCTIONS) IMPLANT
SUT ETHILON 3 0 FSL (SUTURE) IMPLANT
SUT ETHILON 3 0 PS 1 (SUTURE) ×3 IMPLANT
SUT ETHILON 4 0 PS 2 18 (SUTURE) IMPLANT
SUT MNCRL AB 3-0 PS2 18 (SUTURE) ×3 IMPLANT
SUT VIC AB 0 SH 27 (SUTURE) IMPLANT
SUT VIC AB 2-0 SH 27 (SUTURE)
SUT VIC AB 2-0 SH 27XBRD (SUTURE) IMPLANT
SUT VICRYL 4-0 PS2 18IN ABS (SUTURE) IMPLANT
SYR BULB 3OZ (MISCELLANEOUS) ×3 IMPLANT
SYR CONTROL 10ML LL (SYRINGE) IMPLANT
TOWEL OR 17X24 6PK STRL BLUE (TOWEL DISPOSABLE) ×6 IMPLANT
TOWEL OR NON WOVEN STRL DISP B (DISPOSABLE) IMPLANT
TUBE CONNECTING 20'X1/4 (TUBING)
TUBE CONNECTING 20X1/4 (TUBING) IMPLANT
UNDERPAD 30X30 INCONTINENT (UNDERPADS AND DIAPERS) ×3 IMPLANT
YANKAUER SUCT BULB TIP NO VENT (SUCTIONS) IMPLANT

## 2013-11-11 NOTE — Anesthesia Procedure Notes (Addendum)
Anesthesia Regional Block:  Popliteal block  Pre-Anesthetic Checklist: ,, timeout performed, Correct Patient, Correct Site, Correct Laterality, Correct Procedure, Correct Position, site marked, Risks and benefits discussed,  Surgical consent,  Pre-op evaluation,  At surgeon's request and post-op pain management  Laterality: Right  Prep: chloraprep       Needles:  Injection technique: Single-shot  Needle Type: Echogenic Stimulator Needle          Additional Needles:  Procedures: ultrasound guided (picture in chart) and nerve stimulator Popliteal block  Nerve Stimulator or Paresthesia:  Response: plantar flexion, 0.45 mA,   Additional Responses:   Narrative:  Start time: 11/11/2013 8:01 AM End time: 11/11/2013 8:11 AM  Performed by: Personally  Anesthesiologist: J. Tamela Gammon, MD  Additional Notes: A functioning IV was confirmed and monitors were applied.  Sterile prep and drape, hand hygiene and sterile gloves were used.  Negative aspiration and test dose prior to incremental administration of local anesthetic. The patient tolerated the procedure well.Ultrasound  guidance: relevant anatomy identified, needle position confirmed, local anesthetic spread visualized around nerve(s), vascular puncture avoided.  Image printed for medical record.    Procedure Name: LMA Insertion Performed by: Terrance Mass Pre-anesthesia Checklist: Patient identified, Timeout performed, Emergency Drugs available, Suction available and Patient being monitored Patient Re-evaluated:Patient Re-evaluated prior to inductionOxygen Delivery Method: Circle system utilized Preoxygenation: Pre-oxygenation with 100% oxygen Intubation Type: IV induction Ventilation: Mask ventilation without difficulty LMA: LMA inserted

## 2013-11-11 NOTE — Anesthesia Preprocedure Evaluation (Signed)
Anesthesia Evaluation  Patient identified by MRN, date of birth, ID band Patient awake    Reviewed: Allergy & Precautions, H&P , NPO status , Patient's Chart, lab work & pertinent test results  History of Anesthesia Complications (+) PONV and history of anesthetic complications  Airway Mallampati: III TM Distance: >3 FB Neck ROM: Full    Dental  (+) Teeth Intact, Dental Advisory Given, Implants   Pulmonary shortness of breath, sleep apnea and Continuous Positive Airway Pressure Ventilation , COPDformer smoker,    Pulmonary exam normal       Cardiovascular hypertension, + angina + CAD and + Peripheral Vascular Disease     Neuro/Psych PSYCHIATRIC DISORDERS Depression negative neurological ROS     GI/Hepatic GERD-  ,  Endo/Other  diabetesHypothyroidism   Renal/GU Renal InsufficiencyRenal disease     Musculoskeletal   Abdominal   Peds  Hematology   Anesthesia Other Findings   Reproductive/Obstetrics                           Anesthesia Physical Anesthesia Plan  ASA: III  Anesthesia Plan: General   Post-op Pain Management:    Induction: Intravenous  Airway Management Planned: LMA  Additional Equipment:   Intra-op Plan:   Post-operative Plan: Extubation in OR  Informed Consent: I have reviewed the patients History and Physical, chart, labs and discussed the procedure including the risks, benefits and alternatives for the proposed anesthesia with the patient or authorized representative who has indicated his/her understanding and acceptance.   Dental advisory given  Plan Discussed with: CRNA, Anesthesiologist and Surgeon  Anesthesia Plan Comments:         Anesthesia Quick Evaluation

## 2013-11-11 NOTE — Discharge Instructions (Signed)
Harold Simmer, MD [place]  Please read the following information regarding your care after surgery.  Medications  You only need a prescription for the narcotic pain medicine (ex. oxycodone, Percocet, Norco).  All of the other medicines listed below are available over the counter. X acetominophen (Tylenol) 650 mg every 4-6 hours as you need for minor pain X oxycodone as prescribed for moderate to severe pain   Narcotic pain medicine (ex. oxycodone, Percocet, Vicodin) will cause constipation.  To prevent this problem, take the following medicines while you are taking any pain medicine. X docusate sodium (Colace) 100 mg twice a day X senna (Senokot) 2 tablets twice a day  Weight Bearing X Bear weight when you are able on your operated leg or foot in the post-op shoe.  Cast / Splint / Dressing X Keep your dressing clean and dry.  Dont put anything (coat hanger, pencil, etc) down inside of it.  If it gets damp, use a hair dryer on the cool setting to dry it.  If it gets soaked, call the office to schedule an appointment for a dressing change.  After your dressing, cast or splint is removed; you may shower, but do not soak or scrub the wound.  Allow the water to run over it, and then gently pat it dry.  Swelling It is normal for you to have swelling where you had surgery.  To reduce swelling and pain, keep your toes above your nose for at least 3 days after surgery.  It may be necessary to keep your foot or leg elevated for several weeks.  If it hurts, it should be elevated.  Follow Up Call my office at [phone] when you are discharged from the hospital or surgery center to schedule an appointment to be seen two weeks after surgery.  Call my office at [phone] if you develop a fever >101.5 F, nausea, vomiting, bleeding from the surgical site or severe pain.      Post Anesthesia Home Care Instructions  Activity: Get plenty of rest for the remainder of the day. A  responsible adult should stay with you for 24 hours following the procedure.  For the next 24 hours, DO NOT: -Drive a car -Paediatric nurse -Drink alcoholic beverages -Take any medication unless instructed by your physician -Make any legal decisions or sign important papers.  Meals: Start with liquid foods such as gelatin or soup. Progress to regular foods as tolerated. Avoid greasy, spicy, heavy foods. If nausea and/or vomiting occur, drink only clear liquids until the nausea and/or vomiting subsides. Call your physician if vomiting continues.  Special Instructions/Symptoms: Your throat may feel dry or sore from the anesthesia or the breathing tube placed in your throat during surgery. If this causes discomfort, gargle with warm salt water. The discomfort should disappear within 24 hours. Regional Anesthesia Blocks  1. Numbness or the inability to move the "blocked" extremity may last from 3-48 hours after placement. The length of time depends on the medication injected and your individual response to the medication. If the numbness is not going away after 48 hours, call your surgeon.  2. The extremity that is blocked will need to be protected until the numbness is gone and the  Strength has returned. Because you cannot feel it, you will need to take extra care to avoid injury. Because it may be weak, you may have difficulty moving it or using it. You may not know what position it is in without looking at it while the  block is in effect.  3. For blocks in the legs and feet, returning to weight bearing and walking needs to be done carefully. You will need to wait until the numbness is entirely gone and the strength has returned. You should be able to move your leg and foot normally before you try and bear weight or walk. You will need someone to be with you when you first try to ensure you do not fall and possibly risk injury.  4. Bruising and tenderness at the needle site are common side  effects and will resolve in a few days.  5. Persistent numbness or new problems with movement should be communicated to the surgeon or the [place] [phone] [place] [phone]).

## 2013-11-11 NOTE — Progress Notes (Signed)
Assisted Dr. Tobias Alexander with right, ultrasound guided, popliteal/saphenous block. Side rails up, monitors on throughout procedure. See vital signs in flow sheet. Tolerated Procedure well.

## 2013-11-11 NOTE — H&P (Signed)
Harold [name] is an 58 y.o. male.   Chief Complaint: right forefootpain HPI:  58 y/o male with PMH of diabetes and CAD c/o of a painful bunion on the right foot.  He has failed non op treatment and presents now for bunionectomy.  Past Medical History  Diagnosis Date  . Diabetes mellitus   . Carotid artery occlusion   . Complication of anesthesia   . PONV (postoperative nausea and vomiting)     "[place]"  . Hypothyroidism   . Depression     situaltional   . Neuropathy     notably improved following PCI with improved cardiac function  . CAD S/P percutaneous coronary angioplasty 2003, 04/2012    status post PCI to LAD, circumflex-OM 2, RCA  . 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 Dyslipidemia associated with type 2 diabetes mellitus   . GERD (gastroesophageal reflux disease)   . Chronic renal insufficiency, stage II (mild)   . Pulmonary hypertension 04/2012    PA pressure 87mhg  . HTN (hypertension)   . Chronic venous insufficiency     varicosities, no reflux; dopplers 04/14/12- valvular insufficiency in the R and L GSV  . Anginal pain March 2015    Cardiac cath showed patent stents with distal LAD, circumflex-OM and RCA disease in small vessels.  . Obesity   . Sleep apnea     uses nightly  . Neuromuscular disorder     neuropathy in feet    Past Surgical History  Procedure Laterality Date  . Umbilical hernia repair  2009    steel mesh insert  . Coronary angioplasty  12/21/2001    PTCA of the distal and mid AV groove circ, unsuccessful PTCA of second OM total occlusion, unsuccessful PTCA of the apical LAD total occlusion  . Colonscopy    . Carotid endarterectomy  2005    Right; recent carotid Dopplers notes elevated velocities.   . Back surgery  2005 x1    X2-2010  . Lumbar laminectomy/decompression microdiscectomy  01/07/2012    Procedure: LUMBAR LAMINECTOMY/DECOMPRESSION  MICRODISCECTOMY 1 LEVEL;  Surgeon: KWinfield Cunas MD;  [facility];  Service: Neurosurgery;  Laterality: Bilateral;   Lumbar Three-Four Decompression  . Coronary stent placement  04/28/12    PCI to LAD (3x221mXience DES postdilated to 3.25) and circ prox and mid (2 overlappinmg 2.76m33m2mmXience DES postdilated to 2.776m30m. Cardiac catheterization  12/11/2001    significant 3V CAD, normal LV function  . Coronary angioplasty with stent placement  04/29/12    PCI to 3 RCA lesions, Promus Premiere 2.276mm91m distally, mid was 2.76mx28276mand proximally 2.75x16mm, 32m5-60%  . Doppler echocardiography  04/28/2012    poor quality study: EF estimated 60-65%; unable to assess diastolic function (previously noted to have diastolic dysfunction); severely dilated left atrium and mild right atrium; dilated IVC consistent with increased central venous pressure..  . Carotid doppler  04/27/2012    right internal carotid: Elevated velocities but no evidence of plaque. Left internal carotid 40-59%  . Abis  04/27/2012    mild bilateral arterial insufficiency  . Cardiac catheterization  March 2015    Moderate Pulm HTN: 46/16 - mean 33 mmHg; PCWP 22mmHg;71mltivessel CAD with widely patent mid LAD stents and 90% apical LAD, widely patent proximal and mid cervical extends with previous subtotal occlusion of smaller tube, I be patent RCA ostial  mid and distal stents with 70% distal continuation RCA disease    Family History  Problem Relation Age of Onset  . Adopted: Yes   Social History:  reports that he quit smoking about 10 years ago. His smoking use included Cigarettes. He has a 42 pack-year smoking history. He has never used smokeless tobacco. He reports that he does not drink alcohol or use illicit drugs.  Allergies:  Allergies  Allergen Reactions  . Septra [Bactrim] Itching  . Penicillins Hives    Allergy to All cillin drugs  . Clopidogrel     NON RESPONDER  P2Y12  . Other     Walnuts,  pecans     Medications Prior to Admission  Medication Sig Dispense Refill  . carvedilol (COREG) 6.25 MG tablet TAKE 1 TABLET (6.25 MG TOTAL) BY MOUTH 2 (TWO) TIMES DAILY.  60 tablet  11  . cholecalciferol (VITAMIN D) 1000 UNITS tablet Take 1,000 Units by mouth 4 (four) times daily.       Marland Kitchen escitalopram (LEXAPRO) 20 MG tablet TAKE 1 TABLET (20 MG TOTAL) BY MOUTH DAILY. NEED VISIT!!  30 tablet  2  . esomeprazole (NEXIUM) 40 MG packet Take 40 mg by mouth daily before breakfast.  90 each  1  . felodipine (PLENDIL) 10 MG 24 hr tablet Take 1 tablet (10 mg total) by mouth daily.  30 tablet  6  . fish oil-omega-3 fatty acids 1000 MG capsule Take 1,200 mg by mouth daily.       . furosemide (LASIX) 40 MG tablet Take 40 mg by mouth daily.      . Garlic 341 MG CAPS Take 500 mg by mouth daily.      . insulin aspart (NOVOLOG) 100 UNIT/ML injection Inject 50 Units into the skin 2 (two) times daily.       . insulin NPH (HUMULIN N,NOVOLIN N) 100 UNIT/ML injection Inject 75 Units into the skin 2 (two) times daily before a meal.       . levothyroxine (SYNTHROID, LEVOTHROID) 88 MCG tablet Take 88 mcg by mouth daily before breakfast.      . lisinopril (PRINIVIL,ZESTRIL) 20 MG tablet Take 0.5 tablets (10 mg total) by mouth daily.  30 tablet  11  . metFORMIN (GLUCOPHAGE) 1000 MG tablet Take 1 tablet (1,000 mg total) by mouth 2 (two) times daily with a meal.      . Misc Natural Products (LUTEIN 20 PO) Take 20 mg by mouth daily.      . Multiple Vitamins-Minerals (MULTIVITAMIN WITH MINERALS) tablet Take 1 tablet by mouth daily.      . ticagrelor (BRILINTA) 90 MG TABS tablet TAKE 1 TABLET (90 MG TOTAL) BY MOUTH 2 (TWO) TIMES DAILY.  60 tablet  11  . TURMERIC PO Take 800 mg by mouth daily.      . nitroGLYCERIN (NITROSTAT) 0.4 MG SL tablet Place 1 tablet (0.4 mg total) under the tongue every 5 (five) minutes as needed for chest pain (severe chest pressure or tightness).  25 tablet  2    Results for orders placed during  the hospital encounter of 11/11/13 (from the past 48 hour(s))  BASIC METABOLIC PANEL     Status: Abnormal   Collection Time    11/09/13  9:15 AM      Result Value Ref Range   Sodium 139  137 - 147 mEq/L   Potassium 5.1  3.7 - 5.3 mEq/L   Chloride 99  96 - 112 mEq/L   CO2  27  19 - 32 mEq/L   Glucose, Bld 298 (*) 70 - 99 mg/dL   BUN 16  6 - 23 mg/dL   Creatinine, Ser 0.98  0.50 - 1.35 mg/dL   Calcium 9.1  8.4 - 10.5 mg/dL   GFR calc non Af Amer 88 (*) >90 mL/min   GFR calc Af Amer >90  >90 mL/min   Comment: (NOTE)     The eGFR has been calculated using the CKD EPI equation.     This calculation has not been validated in all clinical situations.     eGFR's persistently <90 mL/min signify possible Chronic Kidney     Disease.   Anion gap 13  5 - 15  POCT HEMOGLOBIN-HEMACUE     Status: None   Collection Time    November 27, 2013  7:34 AM      Result Value Ref Range   Hemoglobin 13.0  13.0 - 17.0 g/dL  GLUCOSE, CAPILLARY     Status: Abnormal   Collection Time    11/27/13  7:37 AM      Result Value Ref Range   Glucose-Capillary 194 (*) 70 - 99 mg/dL   No results found.  ROS  No recent f/c/n/v/wt loss  Blood pressure 146/66, pulse 63, temperature 97.7 F (36.5 C), temperature source Oral, resp. rate 17, height 6' 1" (1.854 m), weight 146.228 kg (322 lb 6 oz), SpO2 98.00%. Physical Exam  Obese male in nad.  A and O x 4.  Mood and affect normal.  EOMI.  Resp unlabored.  R forefoot with prominent bunion.  Healthy skin.  No lymphadenopathy.  5/5 strength in PF and DF of the ankle and toes.  Sens to LT intact at the forefoot.    Assessment/Plan R hallux valgus - to OR for silver bunionectomy.  The risks and benefits of the alternative treatment options have been discussed in detail.  The patient wishes to proceed with surgery and specifically understands risks of bleeding, infection, nerve damage, blood clots, need for additional surgery, amputation and death.   Wylene Simmer Nov 27, 2013, 8:22  AM

## 2013-11-11 NOTE — Transfer of Care (Signed)
Immediate Anesthesia Transfer of Care Note  Patient: Harold [name]  Procedure(s) Performed: Procedure(s): RIGHT FOOT SILVER BUNIONECTOMY (Right)  Patient Location: PACU  Anesthesia Type:General and GA combined with regional for post-op pain  Level of Consciousness: sedated and patient cooperative  Airway & Oxygen Therapy: Patient Spontanous Breathing and Patient connected to face mask oxygen  Post-op Assessment: Report given to PACU RN and Post -op Vital signs reviewed and stable  Post vital signs: Reviewed and stable  Complications: No apparent anesthesia complications

## 2013-11-11 NOTE — Brief Op Note (Signed)
11/11/2013  9:38 AM  PATIENT:  Harold [name]  58 y.o. male  PRE-OPERATIVE DIAGNOSIS:  right hallux valgus  POST-OPERATIVE DIAGNOSIS:  right hallux valgus  Procedure(s): RIGHT FOOT SILVER BUNIONECTOMY  SURGEON:  Wylene Simmer, MD  ASSISTANT: n/a  ANESTHESIA:   General, regional  EBL:  minimal   TOURNIQUET:   Total Tourniquet Time Documented: Thigh (Right) - 18 minutes Total: Thigh (Right) - 18 minutes   COMPLICATIONS:  None apparent  DISPOSITION:  Extubated, awake and stable to recovery.  DICTATION ID:  [id number]

## 2013-11-11 NOTE — Anesthesia Postprocedure Evaluation (Signed)
Anesthesia Post Note  Patient: Harold [name]  Procedure(s) Performed: Procedure(s) (LRB): RIGHT FOOT SILVER BUNIONECTOMY (Right)  Anesthesia type: general  Patient location: PACU  Post pain: Pain level controlled  Post assessment: Patient's Cardiovascular Status Stable  Last Vitals:  Filed Vitals:   11/11/13 1015  BP: 155/73  Pulse: 60  Temp:   Resp: 17    Post vital signs: Reviewed and stable  Level of consciousness: sedated  Complications: No apparent anesthesia complications

## 2013-11-11 NOTE — Op Note (Signed)
NAMEWILBORN, MEMBRENO NO.:  [id number]  MEDICAL RECORD NO.:  [id number]  LOCATION:                                 FACILITY:  PHYSICIAN:  Wylene Simmer, MD        DATE OF BIRTH:  04-12-1955  DATE OF PROCEDURE:  11/11/2013 DATE OF DISCHARGE:                              OPERATIVE REPORT   PREOPERATIVE DIAGNOSIS:  Right hallux valgus.  POSTOPERATIVE DIAGNOSIS:  Right hallux valgus.  PROCEDURE:  Right foot silver bunionectomy.   AP and lateral xrays of the right foot  SURGEON:  Wylene Simmer, MD  ANESTHESIA:  General, regional.  ESTIMATED BLOOD LOSS:  Minimal.  TOURNIQUET TIME:  18 minutes at 250 mmHg.  COMPLICATIONS:  None apparent.  DISPOSITION:  Extubated, awake, and stable to recovery.  INDICATIONS FOR PROCEDURE:  The patient is a 58 year old male with past medical history significant for diabetes and coronary artery disease. He has a painful bunion on the right foot.  He presents now for operative treatment of this painful condition having failed nonoperative treatment to date.  He understands the risks and benefits, the alternative treatment options, and elects surgical treatment.  He specifically understands risks of bleeding, infection, nerve damage, blood clots, need for additional surgery, continued pain, amputation, and death.  PROCEDURE IN DETAIL:  After preoperative consent was obtained and the correct operative site was identified, the patient was brought to the operating room and placed supine on the operating table.  General anesthesia was induced.  Preoperative antibiotics were administered. Surgical time-out was taken.  The right lower extremity was prepped and draped in standard sterile fashion with tourniquet around the thigh. The extremity was exsanguinated and tourniquet was inflated to 250 mmHg. A longitudinal incision was then made over the medial eminence.  Sharp dissection was carried down through the skin and subcutaneous  tissue. Medial joint capsule was elevated plantarly and dorsally exposing the hypertrophic medial eminence.  The oscillating saw was used to resect the medial eminence in line with the first metatarsal shaft medially. The cut surface of bone was smoothed with the saw dorsally and plantarly.  Wound was irrigated copiously.  AP and lateral radiographs confirmed appropriate resection of the medial eminence.  The redundant joint capsule was excised on the plantar and dorsal portions of the joint capsule.  The capsule was then repaired with figure-of-eight sutures of 2-0 Vicryl.  Skin incision was closed with a running 3-0 nylon.  Sterile dressings were applied followed by a bunion wrap. Tourniquet had been released at 18 minutes after application of the dressings.  The patient was awakened from anesthesia and transported to the recovery room in stable condition.  FOLLOWUP PLAN:  The patient will be weightbearing as tolerated on the right lower extremity in a flat postop shoe.  He will follow up with me in 2 weeks for suture removal.  RADIOGRAPHS:  AP and lateral radiographs were obtained of the right foot intraoperatively today.  These show interval resection of the hypertrophic medial eminence with appropriate correction of the bunion deformity.  No fracture dislocation or malalignment is noted.     Wylene Simmer, MD  JH/MEDQ  D:  11/11/2013  T:  11/11/2013  Job:  [id number]

## 2013-11-12 ENCOUNTER — Encounter (HOSPITAL_BASED_OUTPATIENT_CLINIC_OR_DEPARTMENT_OTHER): Payer: Self-pay | Admitting: Orthopedic Surgery

## 2013-11-12 ENCOUNTER — Other Ambulatory Visit: Payer: Self-pay | Admitting: Physician Assistant

## 2013-11-12 NOTE — Addendum Note (Signed)
Addendum created 11/12/13 2505 by Mubashir Mallek W Maylee Bare, CRNA   Modules edited: Charges VN

## 2013-11-15 ENCOUNTER — Ambulatory Visit: Payer: PRIVATE HEALTH INSURANCE | Admitting: Pharmacist

## 2013-12-16 ENCOUNTER — Other Ambulatory Visit: Payer: Self-pay | Admitting: Emergency Medicine

## 2013-12-18 ENCOUNTER — Other Ambulatory Visit (HOSPITAL_COMMUNITY): Payer: Self-pay | Admitting: Cardiology

## 2013-12-20 NOTE — Telephone Encounter (Signed)
Rx was sent to pharmacy electronically. 

## 2013-12-23 ENCOUNTER — Ambulatory Visit: Payer: PRIVATE HEALTH INSURANCE | Admitting: Neurology

## 2013-12-31 ENCOUNTER — Telehealth: Payer: Self-pay

## 2013-12-31 NOTE — Telephone Encounter (Signed)
Called pt to see if he wants to order needles from [facility] and he agreed, but had given them the name of provider who manages DM and they have already approved it. Shredded order form.

## 2014-01-16 ENCOUNTER — Other Ambulatory Visit: Payer: Self-pay | Admitting: Emergency Medicine

## 2014-01-24 ENCOUNTER — Telehealth: Payer: Self-pay | Admitting: Radiology

## 2014-01-24 NOTE — Telephone Encounter (Signed)
Renay has left message for patient, to see if he would like to come in for his flu shot.

## 2014-01-27 ENCOUNTER — Encounter (HOSPITAL_COMMUNITY): Payer: Self-pay | Admitting: Vascular Surgery

## 2014-02-03 ENCOUNTER — Other Ambulatory Visit: Payer: Self-pay | Admitting: Physician Assistant

## 2014-02-03 NOTE — Telephone Encounter (Signed)
Dr Everlene Farrier, we have put notices on last couple of RFs that he needs OV, but he has not sch appt. Do you need to see pt, or do you normally just see him annually? Last seen by you in Apr.

## 2014-02-04 ENCOUNTER — Encounter: Payer: Self-pay | Admitting: Family

## 2014-02-04 ENCOUNTER — Telehealth: Payer: Self-pay | Admitting: Emergency Medicine

## 2014-02-04 NOTE — Telephone Encounter (Signed)
Patient dropped off handicapped placard to be completed by Dr. Everlene Farrier. Please send to disabilities department upon completion  [phone]

## 2014-02-07 ENCOUNTER — Encounter: Payer: Self-pay | Admitting: Family

## 2014-02-07 ENCOUNTER — Ambulatory Visit (INDEPENDENT_AMBULATORY_CARE_PROVIDER_SITE_OTHER): Payer: PRIVATE HEALTH INSURANCE | Admitting: Family

## 2014-02-07 ENCOUNTER — Ambulatory Visit (HOSPITAL_COMMUNITY)
Admission: RE | Admit: 2014-02-07 | Discharge: 2014-02-07 | Disposition: A | Discharge status: Home / Self Care | Payer: No Typology Code available for payment source | Source: Ambulatory Visit | Attending: Family | Admitting: Family

## 2014-02-07 VITALS — BP 141/74 | HR 60 | Resp 16 | Ht 73.0 in | Wt 317.0 lb

## 2014-02-07 DIAGNOSIS — I6523 Occlusion and stenosis of bilateral carotid arteries: Secondary | ICD-10-CM | POA: Diagnosis present

## 2014-02-07 DIAGNOSIS — Z9889 Other specified postprocedural states: Secondary | ICD-10-CM

## 2014-02-07 DIAGNOSIS — Z48812 Encounter for surgical aftercare following surgery on the circulatory system: Secondary | ICD-10-CM | POA: Insufficient documentation

## 2014-02-07 NOTE — Patient Instructions (Signed)
Stroke Prevention Some medical conditions and behaviors are associated with an increased chance of having a stroke. You may prevent a stroke by making healthy choices and managing medical conditions. HOW CAN I REDUCE MY RISK OF HAVING A STROKE?   Stay physically active. Get at least 30 minutes of activity on most or all days.  Do not smoke. It may also be helpful to avoid exposure to secondhand smoke.  Limit alcohol use. Moderate alcohol use is considered to be:  No more than 2 drinks per day for men.  No more than 1 drink per day for nonpregnant women.  Eat healthy foods. This involves:  Eating 5 or more servings of fruits and vegetables a day.  Making dietary changes that address high blood pressure (hypertension), high cholesterol, diabetes, or obesity.  Manage your cholesterol levels.  Making food choices that are high in fiber and low in saturated fat, trans fat, and cholesterol may control cholesterol levels.  Take any prescribed medicines to control cholesterol as directed by your health care provider.  Manage your diabetes.  Controlling your carbohydrate and sugar intake is recommended to manage diabetes.  Take any prescribed medicines to control diabetes as directed by your health care provider.  Control your hypertension.  Making food choices that are low in salt (sodium), saturated fat, trans fat, and cholesterol is recommended to manage hypertension.  Take any prescribed medicines to control hypertension as directed by your health care provider.  Maintain a healthy weight.  Reducing calorie intake and making food choices that are low in sodium, saturated fat, trans fat, and cholesterol are recommended to manage weight.  Stop drug abuse.  Avoid taking birth control pills.  Talk to your health care provider about the risks of taking birth control pills if you are over 35 years old, smoke, get migraines, or have ever had a blood clot.  Get evaluated for sleep  disorders (sleep apnea).  Talk to your health care provider about getting a sleep evaluation if you snore a lot or have excessive sleepiness.  Take medicines only as directed by your health care provider.  For some people, aspirin or blood thinners (anticoagulants) are helpful in reducing the risk of forming abnormal blood clots that can lead to stroke. If you have the irregular heart rhythm of atrial fibrillation, you should be on a blood thinner unless there is a good reason you cannot take them.  Understand all your medicine instructions.  Make sure that other conditions (such as anemia or atherosclerosis) are addressed. SEEK IMMEDIATE MEDICAL CARE IF:   You have sudden weakness or numbness of the face, arm, or leg, especially on one side of the body.  Your face or eyelid droops to one side.  You have sudden confusion.  You have trouble speaking (aphasia) or understanding.  You have sudden trouble seeing in one or both eyes.  You have sudden trouble walking.  You have dizziness.  You have a loss of balance or coordination.  You have a sudden, severe headache with no known cause.  You have new chest pain or an irregular heartbeat. Any of these symptoms may represent a serious problem that is an emergency. Do not wait to see if the symptoms will go away. Get medical help at once. Call your local emergency services (911 in [place] Do not drive yourself to the hospital. Document Released: 03/14/2004 Document Revised: 06/21/2013 Document Reviewed: 08/07/2012 [employer] Patient Information 2015 [employer]. This information is not intended to replace advice given   to you by your health care provider. Make sure you discuss any questions you have with your health care provider.

## 2014-02-07 NOTE — Progress Notes (Signed)
Established Carotid Patient   History of Present Illness  Harold [name] is a 58 y.o. male patient of Dr. Oneida Alar who presented for evaluation in Sept., 2013 of a moderate carotid stenosis as well as bilateral lower extremity pain. The patient previously had a right carotid endarterectomy in 2005 by Dr. Oneida Alar.  On 11/15/11 he underwent an aortogram with bilateral run-off by Dr. Oneida Alar, findings were mild diffuse tibial disease with no significant aortoiliac superficial femoral popliteal disease, no intervention necessary. He was counseled by Dr. Oneida Alar at that time re management of his DM and weight loss. He had 6 cardiac stents placed by Dr. Ellyn Hack in March, 2014, he denies having an MI.  He denies claudication symptoms with walking, denies non-healing wounds. He has had several lumbar spine surgeries, states his walking is limited by his back pain. He denies history of stroke or TIA symptoms. Uses his C-PAP nightly for OSA. He had a right trigger finger surgery and right foot bunionectomy in 2015.  Pt Diabetic: Yes, states not in good control, but is working closely with Dr. Chalmers Cater to improve this. Pt smoker: former smoker, quit in 2005  Pt meds include: Statin :Yes Betablocker: Yes ASA: no Other anticoagulants/antiplatelets: Brilinta for CAD   Past Medical History  Diagnosis Date  . Diabetes mellitus   . Carotid artery occlusion   . Complication of anesthesia   . PONV (postoperative nausea and vomiting)     "[place]"  . Hypothyroidism   . Depression     situaltional   . Neuropathy     notably improved following PCI with improved cardiac function  . CAD S/P percutaneous coronary angioplasty 2003, 04/2012    status post PCI to LAD, circumflex-OM 2, RCA  . 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 Dyslipidemia associated with type 2 diabetes mellitus   . GERD (gastroesophageal reflux  disease)   . Chronic renal insufficiency, stage II (mild)   . Pulmonary hypertension 04/2012    PA pressure 23mhg  . HTN (hypertension)   . Chronic venous insufficiency     varicosities, no reflux; dopplers 04/14/12- valvular insufficiency in the R and L GSV  . Anginal pain March 2015    Cardiac cath showed patent stents with distal LAD, circumflex-OM and RCA disease in small vessels.  . Obesity   . Sleep apnea     uses nightly  . Neuromuscular disorder     neuropathy in feet    Social History History  Substance Use Topics  . Smoking status: Former Smoker -- 1.00 packs/day for 42 years    Types: Cigarettes    Quit date: 03/23/2003  . Smokeless tobacco: Never Used  . Alcohol Use: No    Family History Family History  Problem Relation Age of Onset  . Adopted: Yes  . Family history unknown: Yes    Surgical History Past Surgical History  Procedure Laterality Date  . Umbilical hernia repair  2009    steel mesh insert  . Coronary angioplasty  12/21/2001    PTCA of the distal and mid AV groove circ, unsuccessful PTCA of second OM total occlusion, unsuccessful PTCA of the apical LAD total occlusion  . Colonscopy    . Carotid endarterectomy  2005    Right; recent carotid Dopplers notes elevated velocities.   . Back surgery  2005 x1    X2-2010  . Lumbar laminectomy/decompression microdiscectomy  01/07/2012    Procedure: LUMBAR LAMINECTOMY/DECOMPRESSION MICRODISCECTOMY 1 LEVEL;  Surgeon: Winfield Cunas, MD;  Location: [facility];  Service: Neurosurgery;  Laterality: Bilateral;   Lumbar Three-Four Decompression  . Coronary stent placement  04/28/12    PCI to LAD (3x27m Xience DES postdilated to 3.25) and circ prox and mid (2 overlappinmg 2.55m12mmXience DES postdilated to 2.7540m . Cardiac catheterization  12/11/2001    significant 3V CAD, normal LV function  . Coronary angioplasty with stent placement  04/29/12    PCI to 3 RCA lesions, Promus Premiere 2.35m100mm distally, mid  was 2.5mx274m and proximally 2.75x16mm,15m55-60%  . Doppler echocardiography  04/28/2012    poor quality study: EF estimated 60-65%; unable to assess diastolic function (previously noted to have diastolic dysfunction); severely dilated left atrium and mild right atrium; dilated IVC consistent with increased central venous pressure..  . Carotid doppler  04/27/2012    right internal carotid: Elevated velocities but no evidence of plaque. Left internal carotid 40-59%  . Abis  04/27/2012    mild bilateral arterial insufficiency  . Cardiac catheterization  March 2015    Moderate Pulm HTN: 46/16 - mean 33 mmHg; PCWP 22mmHg90[name], widely patent proximal and mid cervical extends with previous subtotal occlusion of smaller tube, I be patent RCA ostial mid and distal stents with 70% distal continuation RCA disease  . Bunionectomy Right 11/11/2013    Procedure: RIGHT FOOT SILVER BUNIONECTOMY;  Surgeon: John HeWylene SimmerLocation: [facility];  Laterality: Right;  . Abdominal aortagram N/A 11/15/2011    Procedure: ABDOMINAL AORTAGRAM;  Surgeon: CharlesElam DutchLocation[facility];  Service: Cardiovascular;  [name];  . Left and right heart catheterization with coronary angiogram N/A 04/27/2012    Procedure: LEFT AND RIGHT HEART CATHETERIZATION WITH CORONARY ANGIOGRAM;  Surgeon: David WLeonie ManLocation[facility];  Service: Cardiovascular;  [name];  . Percutaneous coronary stent intervention (pci-s) N/A 04/28/2012    Procedure: PERCUTANEOUS CORONARY STENT INTERVENTION (PCI-S);  Surgeon: Thomas Troy SineLocation[facility];  Service: Cardiovascular;  [name];  . Percutaneous coronary stent intervention (pci-s) N/A 04/29/2012    Procedure: PERCUTANEOUS CORONARY STENT INTERVENTION (PCI-S);  Surgeon: David WLeonie ManLocation[facility];  Service: Cardiovascular;   [name];  . Left and right heart catheterization with coronary angiogram N/A 05/14/2013    Procedure: LEFT AND RIGHT HEART CATHETERIZATION WITH CORONARY ANGIOGRAM;  Surgeon: Thomas Troy SineLocation[facility];  Service: Cardiovascular;  [name];    Allergies  Allergen Reactions  . Septra [Bactrim] Itching  . Penicillins Hives    Allergy to All cillin drugs  Ancef given 9/24 with no obvious reaction  . Clopidogrel     NON RESPONDER  P2Y12  . Other     Walnuts, pecans     Current Outpatient Prescriptions  Medication Sig Dispense Refill  . carvedilol (COREG) 6.25 MG tablet TAKE 1 TABLET (6.25 MG TOTAL) BY MOUTH 2 (TWO) TIMES DAILY. 60 tablet 11  . cholecalciferol (VITAMIN D) 1000 UNITS tablet Take 1,000 Units by mouth 4 (four) times daily.     . Choline Fenofibrate (FENOFIBRIC ACID) 135 MG CPDR Take 1 capsule by mouth daily.  6  . escitalopram (LEXAPRO) 20 MG tablet TAKE 1 TABLET (20 MG TOTAL) BY MOUTH DAILY. "NO MORE REFILLS  WITHOUT APPOINTMENT" 15 tablet 0  . esomeprazole (NEXIUM) 40 MG packet Take 40 mg by mouth daily before breakfast. 90 each 1  . felodipine (PLENDIL) 10 MG 24 hr tablet TAKE 1 TABLET (10 MG TOTAL) BY MOUTH DAILY. 30 tablet 6  . fish oil-omega-3 fatty acids 1000 MG capsule Take 1,200 mg by mouth daily.     . furosemide (LASIX) 40 MG tablet Take 40 mg by mouth daily.    . Garlic 756 MG CAPS Take 500 mg by mouth daily.    Marland Kitchen HUMULIN N KWIKPEN 100 UNIT/ML Kiwkpen   4  . insulin aspart (NOVOLOG) 100 UNIT/ML injection Inject 50 Units into the skin 2 (two) times daily.     . insulin NPH (HUMULIN N,NOVOLIN N) 100 UNIT/ML injection Inject 75 Units into the skin 2 (two) times daily before a meal.     . Iodine, Kelp, 0.15 MG TABS Take by mouth daily.    Marland Kitchen levothyroxine (SYNTHROID, LEVOTHROID) 88 MCG tablet Take 88 mcg by mouth daily before breakfast.    . lisinopril (PRINIVIL,ZESTRIL) 20 MG tablet Take 0.5 tablets (10 mg total) by mouth daily. 30 tablet  11  . metFORMIN (GLUCOPHAGE-XR) 500 MG 24 hr tablet   11  . Misc Natural Products (LUTEIN 20 PO) Take 20 mg by mouth daily.    . Multiple Vitamins-Minerals (MULTIVITAMIN WITH MINERALS) tablet Take 1 tablet by mouth daily.    . nitroGLYCERIN (NITROSTAT) 0.4 MG SL tablet Place 1 tablet (0.4 mg total) under the tongue every 5 (five) minutes as needed for chest pain (severe chest pressure or tightness). 25 tablet 2  . NOVOLOG FLEXPEN 100 UNIT/ML FlexPen   6  . ticagrelor (BRILINTA) 90 MG TABS tablet TAKE 1 TABLET (90 MG TOTAL) BY MOUTH 2 (TWO) TIMES DAILY. 60 tablet 11  . TURMERIC PO Take 800 mg by mouth daily.    . vitamin C (ASCORBIC ACID) 500 MG tablet Take 1,000 mg by mouth daily.    . metFORMIN (GLUCOPHAGE) 1000 MG tablet Take 1 tablet (1,000 mg total) by mouth 2 (two) times daily with a meal. (Patient not taking: Reported on 02/07/2014)    . oxyCODONE (ROXICODONE) 5 MG immediate release tablet Take 1-2 tablets (5-10 mg total) by mouth every 4 (four) hours as needed for moderate pain or severe pain. (Patient not taking: Reported on 02/07/2014) 20 tablet 0  . [DISCONTINUED] insulin lispro (HUMALOG) 100 UNIT/ML injection Inject 25 Units into the skin 3 (three) times daily before meals.     No current facility-administered medications for this visit.    Review of Systems : See HPI for pertinent positives and negatives.  Physical Examination  Filed Vitals:   02/07/14 1511 02/07/14 1514  BP: 133/67 141/74  Pulse: 58 60  Resp:  16  Height:  _0  (1.854 m)  Weight:  317 lb (143.79 kg)  SpO2:  98%   Body mass index is 41.83 kg/(m^2).  General: Obese male in NAD Gait: Normal HENT: WNL Eyes: Pupils equal and react to light accomodation. Pulmonary: normal non-labored breathing , without Rales, rhonchi, wheezing Cardiac: RRR, without Murmurs, rubs or gallops detected;  Abdomen: soft, NT, no masses Skin: no rashes, ulcers noted; no Gangrene , no cellulitis; no open wounds;    VASCULAR EXAM  Carotid Bruits Left Right   Negative Negative   Aorta is not palpable. Radial pulses are 2+ and =.   VASCULAR EXAM: Extremities without ischemic changes  without Gangrene; without open wounds.  LE Pulses LEFT RIGHT   POPLITEAL not palpable  not palpable   POSTERIOR TIBIAL  not palpable   not palpable    DORSALIS PEDIS  ANTERIOR TIBIAL not palpable  not palpable    Musculoskeletal: no muscle wasting or atrophy; 1-2+ pretibial pitting edema in BLE. Neurologic: A&O X 3; Appropriate Affect ;  SENSATION: normal; MOTOR FUNCTION: 5/5 Symmetric, CN 2-12 intact Speech is fluent/normal    Non-Invasive Vascular Imaging CAROTID DUPLEX 02/07/2014   CEREBROVASCULAR DUPLEX EVALUATION    INDICATION: Carotid stenosis    PREVIOUS INTERVENTION(S): Right carotid endarterectomy 03/23/2003    DUPLEX EXAM:     RIGHT  LEFT  Peak Systolic Velocities (cm/s) End Diastolic Velocities (cm/s) Plaque LOCATION Peak Systolic Velocities (cm/s) End Diastolic Velocities (cm/s) Plaque  101 16  CCA PROXIMAL 130 21   105 15  CCA MID 131 19 HT  83 15 HT CCA DISTAL 105/185 20/29 CP  189 16 HT ECA 326 32 CP  100 22 HT ICA PROXIMAL 221 37 CP  106 28  ICA MID 123 18   98 28  ICA DISTAL 105 25     carotid endarterectomy ICA / CCA Ratio (PSV) 1.70  Antegrade Vertebral Flow Antegrade  761 Brachial Systolic Pressure (mmHg) 950  Triphasic Brachial Artery Waveforms Triphasic    Plaque Morphology:  HM = Homogeneous, HT = Heterogeneous, CP = Calcific Plaque, SP = Smooth Plaque, IP = Irregular Plaque     ADDITIONAL FINDINGS:     IMPRESSION: Right internal carotid artery is patent with history of carotid endarterectomy, minimal heterogeneous plaque present suggestive of less  than 40% stenosis. Left distal common carotid artery disease present of less than 50%. Left internal carotid artery stenosis present in the less than 40% range, may be underestimated due to calcific plaque present making Doppler interrogation difficult. Bilateral external carotid artery stenosis present.    Compared to the previous exam:  Classification downgraded since previous study on 02/22/2013, however may be due to calcific plaque present making Doppler interrogation difficult.      Assessment: Harold [name] is a 58 y.o. male who is s/p right CEA in 2005. He presents with asymptomatic minimal bilateral ICA stenosis. Left distal common carotid artery disease present of less than 50%. Left internal carotid artery stenosis present in the less than 40% range, may be underestimated due to calcific plaque present making Doppler interrogation difficult. Bilateral external carotid artery stenosis present. Classification downgraded since previous study on 02/22/2013, however may be due to calcific plaque present making Doppler interrogation difficult.   Plan: Follow-up in 1 year with Carotid Duplex.   I discussed in depth with the patient the nature of atherosclerosis, and emphasized the importance of maximal medical management including strict control of blood pressure, blood glucose, and lipid levels, obtaining regular exercise, and continued cessation of smoking.  The patient is aware that without maximal medical management the underlying atherosclerotic disease process will progress, limiting the benefit of any interventions. The patient was given information about stroke prevention and what symptoms should prompt the patient to seek immediate medical care. Thank you for allowing [place] to participate in this patient's care.  Clemon Chambers, RN, MSN, FNP-C [facility] Office: [phone]  Clinic Physician: Trula Slade  02/07/2014 3:22 PM

## 2014-02-08 ENCOUNTER — Other Ambulatory Visit: Payer: Self-pay

## 2014-02-08 DIAGNOSIS — I6529 Occlusion and stenosis of unspecified carotid artery: Secondary | ICD-10-CM

## 2014-02-08 NOTE — Telephone Encounter (Signed)
Called patient advised form completed, ready for pick up

## 2014-02-16 ENCOUNTER — Ambulatory Visit (INDEPENDENT_AMBULATORY_CARE_PROVIDER_SITE_OTHER): Payer: PRIVATE HEALTH INSURANCE

## 2014-02-16 ENCOUNTER — Ambulatory Visit (INDEPENDENT_AMBULATORY_CARE_PROVIDER_SITE_OTHER): Payer: PRIVATE HEALTH INSURANCE | Admitting: Emergency Medicine

## 2014-02-16 VITALS — BP 136/78 | HR 86 | Temp 98.9°F | Resp 18 | Ht 73.0 in | Wt 311.0 lb

## 2014-02-16 DIAGNOSIS — R0989 Other specified symptoms and signs involving the circulatory and respiratory systems: Secondary | ICD-10-CM

## 2014-02-16 DIAGNOSIS — R059 Cough, unspecified: Secondary | ICD-10-CM

## 2014-02-16 DIAGNOSIS — F329 Major depressive disorder, single episode, unspecified: Secondary | ICD-10-CM

## 2014-02-16 DIAGNOSIS — R05 Cough: Secondary | ICD-10-CM

## 2014-02-16 DIAGNOSIS — F32A Depression, unspecified: Secondary | ICD-10-CM

## 2014-02-16 DIAGNOSIS — R0981 Nasal congestion: Secondary | ICD-10-CM

## 2014-02-16 MED ORDER — HYDROCOD POLST-CHLORPHEN POLST 10-8 MG/5ML PO LQCR: 5.0000 mL | Freq: Two times a day (BID) | ORAL | Status: DC | PRN

## 2014-02-16 MED ORDER — ESCITALOPRAM OXALATE 20 MG PO TABS: 20.0000 mg | ORAL_TABLET | Freq: Every day | ORAL | Status: DC

## 2014-02-16 MED ORDER — DOXYCYCLINE HYCLATE 100 MG PO CAPS: 100.0000 mg | ORAL_CAPSULE | Freq: Two times a day (BID) | ORAL | Status: DC

## 2014-02-16 MED ORDER — GUAIFENESIN ER 1200 MG PO TB12: 1.0000 | ORAL_TABLET | Freq: Two times a day (BID) | ORAL | Status: DC | PRN

## 2014-02-16 NOTE — Patient Instructions (Signed)
You had some slightly abnormal lung sounds and possibly some extra markings on your chest xray which were similar to your xray from 6 months ago. We'll treat you for lung infection with doxycycline take twice daily for 10 days.  Please take the mucinex twice daily for congestion. Please take the tussionex cough medicine every 12 hours as needed for cough. It may make you sleepy so please start with it tonight so you know how you will react to it. Refilled your lexapro for one year.  Be sure to use nasal saline spray and take a hot shower every day to help clear out your sinuses.

## 2014-02-16 NOTE — Progress Notes (Signed)
Subjective:    Patient ID: Harold [name], male    DOB: [birth year], 58 y.o.   MRN: [id number]  PCP: [name], MD  Chief Complaint  Patient presents with  . Cough    productive x 6 days   . Chills  . Generalized Body Aches  . Nausea    has no appetite   . chest congestion   Patient Active Problem List   Diagnosis Date Noted  . Gastroesophageal reflux disease without esophagitis 10/10/2013  . Chronic diastolic heart failure, NYHA class 2 - PCWPf 22 mmHg during catheterization. 06/27/2013  . Dyspnea 06/17/2013  . COPD (chronic obstructive pulmonary disease) 06/17/2013  . Restrictive lung disease 06/17/2013  . Myalgia and myositis 04/20/2013  . Aftercare following surgery of the circulatory system, [place] 02/22/2013  . Obesity, Class III, BMI 40-49.9 (morbid obesity) 09/01/2012  . Fatigue 09/01/2012  . Preoperative cardiovascular examination 09/01/2012  . Chronic renal insufficiency, stage II (mild) 04/30/2012  . OSA on CPAP 04/29/2012  . Pulmonary hypertension, 74mhg 04/29/2012  . CAD, LAD/CFX DES 04/28/12- staged RCA DES 04/29/12 after pt declined CABG, cath 05/14/13 stable CAD medical therapy   . Occlusion and stenosis of carotid artery without mention of cerebral infarction 11/07/2011  . Diabetes mellitus type 2 with neurological manifestations 04/29/2011  . Essential hypertension 04/29/2011  . Neuropathy 04/29/2011  . 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 Depression 04/29/2011   Prior to Admission medications   Medication Sig Start Date End Date Taking? Authorizing Provider  carvedilol (COREG) 6.25 MG tablet TAKE 1 TABLET (6.25 MG TOTAL) BY MOUTH 2 (TWO) TIMES DAILY. 10/27/13  Yes DLeonie Man MD  cholecalciferol (VITAMIN D) 1000 UNITS tablet Take 1,000 Units by mouth 4 (four) times daily.    Yes Historical Provider, MD  Choline Fenofibrate (FENOFIBRIC ACID) 135 MG CPDR Take 1 capsule by mouth daily. 01/06/14  Yes  Historical Provider, MD  escitalopram (LEXAPRO) 20 MG tablet TAKE 1 TABLET (20 MG TOTAL) BY MOUTH DAILY. "NO MORE REFILLS WITHOUT APPOINTMENT" 02/03/14  Yes SDarlyne Russian MD  esomeprazole (NEXIUM) 40 MG packet Take 40 mg by mouth daily before breakfast. 10/04/13  Yes DLeonie Man MD  felodipine (PLENDIL) 10 MG 24 hr tablet TAKE 1 TABLET (10 MG TOTAL) BY MOUTH DAILY. 12/20/13  Yes DLeonie Man MD  fish oil-omega-3 fatty acids 1000 MG capsule Take 1,200 mg by mouth daily.    Yes Historical Provider, MD  furosemide (LASIX) 40 MG tablet Take 40 mg by mouth daily. 05/15/13  Yes LIsaiah Serge NP  Garlic 5151MG CAPS Take 500 mg by mouth daily.   Yes Historical Provider, MD  HCamille BalKWIKPEN 100 UNIT/ML Kiwkpen  01/04/14  Yes Historical Provider, MD  insulin aspart (NOVOLOG) 100 UNIT/ML injection Inject 50 Units into the skin 2 (two) times daily.  04/29/11  Yes SDarlyne Russian MD  insulin NPH (HUMULIN N,NOVOLIN N) 100 UNIT/ML injection Inject 75 Units into the skin 2 (two) times daily before a meal.    Yes Historical Provider, MD  Iodine, Kelp, 0.15 MG TABS Take by mouth daily.   Yes Historical Provider, MD  levothyroxine (SYNTHROID, LEVOTHROID) 88 MCG tablet Take 88 mcg by mouth daily before breakfast.   Yes Historical Provider, MD  lisinopril (PRINIVIL,ZESTRIL) 20 MG tablet Take 0.5 tablets (10 mg total) by mouth daily. 11/17/12  Yes DLeonie Man MD  metFORMIN (GLUCOPHAGE) 1000 MG tablet Take 1  tablet (1,000 mg total) by mouth 2 (two) times daily with a meal. 05/17/13  Yes Isaiah Serge, NP  metFORMIN (GLUCOPHAGE-XR) 500 MG 24 hr tablet  01/06/14  Yes Historical Provider, MD  Misc Natural Products (LUTEIN 20 PO) Take 20 mg by mouth daily.   Yes Historical Provider, MD  Multiple Vitamins-Minerals (MULTIVITAMIN WITH MINERALS) tablet Take 1 tablet by mouth daily.   Yes Historical Provider, MD  nitroGLYCERIN (NITROSTAT) 0.4 MG SL tablet Place 1 tablet (0.4 mg total) under the tongue every 5 (five)  minutes as needed for chest pain (severe chest pressure or tightness). 04/30/12  Yes Luke K Kilroy, PA-C  NOVOLOG FLEXPEN 100 UNIT/ML FlexPen  01/04/14  Yes Historical Provider, MD  ticagrelor (BRILINTA) 90 MG TABS tablet TAKE 1 TABLET (90 MG TOTAL) BY MOUTH 2 (TWO) TIMES DAILY. 08/31/13  Yes Leonie Man, MD  TURMERIC PO Take 800 mg by mouth daily.   Yes Historical Provider, MD  vitamin C (ASCORBIC ACID) 500 MG tablet Take 1,000 mg by mouth daily.   Yes Historical Provider, MD   Medications, allergies, past medical history, surgical history, family history, social history and problem list reviewed and updated.  HPI  73 yom with extensive pmh including dm2, cad, carotid dz, htn, depression presents with 5 day h/o cough.  Sx started Dec 26th. Initially felt that lungs were full and that he had to cough. Cough has persisted through last 5 days. Throughout day. Productive with yellow/green/brown sputum. Has not taken anything for cough. Feels that his cough has gotten worse past couple days. Feels as if he has burning in his right lower chest with coughing.   Has received flu vaccine this year. Pneumovax 3 yrs ago. No prevnar.   Three days ago started having sore throat and felt that head was full and was congested. Pos rhinorrhea. No fevers but chills last couple days.   Denies abd pain, N/V, diarrhea. No appetite past few days.   Hx sob which has been worked up by pulm. Pt states today copd has been r/o and his breathing probs have been attributed to his weight by pulmonology. He does not use any inhalers at home.   Also mentions he needs lexapro refill. He has been on this for several yrs and feels that it works well for his depression. He still has a few left but is in need of refill today. He is happy with this medication.   Review of Systems No unintentional wt loss, no night sweats. No constipation. No dysuria.     Objective:   Physical Exam  Constitutional: He is oriented to  person, place, and time. He appears well-developed and well-nourished.  Non-toxic appearance. He does not have a sickly appearance. He does not appear ill. No distress.  BP 136/78 mmHg  Pulse 86  Temp(Src) 98.9 F (37.2 C) (Oral)  Resp 18  Ht _0  (1.854 m)  Wt 311 lb (141.069 kg)  BMI 41.04 kg/m2  SpO2 97%   HENT:  Right Ear: Tympanic membrane is not erythematous and not bulging. A middle ear effusion is present.  Left Ear: Tympanic membrane is not erythematous and not bulging. A middle ear effusion is present.  Nose: Mucosal edema and rhinorrhea present. Right sinus exhibits maxillary sinus tenderness. Right sinus exhibits no frontal sinus tenderness. Left sinus exhibits maxillary sinus tenderness. Left sinus exhibits no frontal sinus tenderness.  Mouth/Throat: Uvula is midline, oropharynx is clear and moist and mucous membranes are normal. No oropharyngeal  exudate, posterior oropharyngeal edema, posterior oropharyngeal erythema or tonsillar abscesses.  Neck: No Brudzinski's sign noted.  Pulmonary/Chest: Effort normal. He has no decreased breath sounds. He has no wheezes. He has rhonchi in the right lower field and the left lower field. He has no rales.  Lymphadenopathy:       Head (right side): No submental, no submandibular and no tonsillar adenopathy present.       Head (left side): No submental, no submandibular and no tonsillar adenopathy present.    He has no cervical adenopathy.  Neurological: He is alert and oriented to person, place, and time.  Psychiatric: He has a normal mood and affect. His speech is normal.   [facility] reading (PRIMARY) by  Dr. Everlene Farrier. Findings: Minimal increased markings both bases No change from previous.      Assessment & Plan:   34 yom with extensive pmh including dm2, cad, carotid dz, htn, depression presents with 5 day h/o cough.  Abnormal lung sounds - Plan: DG Chest 2 View, doxycycline (VIBRAMYCIN) 100 MG capsule Cough - Plan: DG Chest 2 View,  doxycycline (VIBRAMYCIN) 100 MG capsule, chlorpheniramine-HYDROcodone (TUSSIONEX PENNKINETIC ER) 10-8 MG/5ML LQCR --rhonchi at bases --probable bronchitis --vitals normal today --cxr with increased marking probably normal/unchanged from last cxr, await radiology read --tx with doxy 10 days due to significant co morbidities  --mucinex/tussionex/rest/fluids/saline flushes --prevnar vaccine next visit  Depression - Plan: escitalopram (LEXAPRO) 20 MG tablet --well controlled --refilled for one year  Head congestion - Plan: Guaifenesin (MUCINEX MAXIMUM STRENGTH) 1200 MG TB12 --mucinex  Julieta Gutting, PA-C Physician Assistant-Certified [facility]  02/16/2014 10:04 AM

## 2014-02-24 ENCOUNTER — Other Ambulatory Visit (HOSPITAL_COMMUNITY): Payer: PRIVATE HEALTH INSURANCE

## 2014-02-24 ENCOUNTER — Ambulatory Visit: Payer: PRIVATE HEALTH INSURANCE | Admitting: Family

## 2014-03-04 ENCOUNTER — Encounter: Payer: Self-pay | Admitting: *Deleted

## 2014-03-04 ENCOUNTER — Telehealth: Payer: Self-pay | Admitting: Cardiology

## 2014-03-04 ENCOUNTER — Telehealth: Payer: Self-pay

## 2014-03-04 DIAGNOSIS — K219 Gastro-esophageal reflux disease without esophagitis: Secondary | ICD-10-CM

## 2014-03-04 MED ORDER — CARVEDILOL 6.25 MG PO TABS: ORAL_TABLET | ORAL | Status: DC

## 2014-03-04 MED ORDER — TICAGRELOR 90 MG PO TABS: ORAL_TABLET | ORAL | Status: DC

## 2014-03-04 MED ORDER — ESOMEPRAZOLE MAGNESIUM 40 MG PO PACK: 40.0000 mg | PACK | Freq: Every day | ORAL | Status: DC

## 2014-03-04 MED ORDER — LISINOPRIL 20 MG PO TABS: 10.0000 mg | ORAL_TABLET | Freq: Every day | ORAL | Status: DC

## 2014-03-04 MED ORDER — FELODIPINE ER 10 MG PO TB24: ORAL_TABLET | ORAL | Status: DC

## 2014-03-04 NOTE — Telephone Encounter (Signed)
Patient needs printed Rx for Nurse Marylen Ponto at [facility] (workplace) He asked which of his cardiac medications were generic - all but Brilinta.   Rx printed. Awaiting MD signature.

## 2014-03-04 NOTE — Telephone Encounter (Signed)
Pt called in wanting to speak with a nurse about some of the medications he is currently on . Please call  Thanks

## 2014-03-04 NOTE — Telephone Encounter (Signed)
Rx(s) signed and at front for patient to pick up

## 2014-03-04 NOTE — Telephone Encounter (Signed)
Pt wants an updated rx sent to a nurse at his work for lexapro and lasix. Patient wants them faxed to [phone] (attn: Marylen Ponto, RN) states his work dispenses medications and this nurse needs an updated prescription for both meds. Patient CB # [phone]

## 2014-03-04 NOTE — Telephone Encounter (Signed)
LMTCB

## 2014-03-07 ENCOUNTER — Telehealth: Payer: Self-pay | Admitting: Family Medicine

## 2014-03-07 NOTE — Telephone Encounter (Signed)
Lm to get more information clarification as to what he needs- do not see we prescribed lasix to pt- year supply of Lexapro sent to [facility] in December.

## 2014-03-07 NOTE — Telephone Encounter (Signed)
Left a message for patient to return call and update his chart with his flu shot information or come by and get the flu shot.

## 2014-03-08 NOTE — Telephone Encounter (Signed)
Lm for rtn call 

## 2014-03-10 ENCOUNTER — Telehealth: Payer: Self-pay | Admitting: Cardiology

## 2014-03-10 NOTE — Telephone Encounter (Signed)
Lm for pt to rtn call if needed. Pt was given refills at last visit.

## 2014-03-10 NOTE — Telephone Encounter (Signed)
Please call,needs to get copies of his prescriptions,he says it is too much to explain to me.

## 2014-03-10 NOTE — Telephone Encounter (Signed)
PATIENT CALLED HE REQUEST A ACOPY OF HIS CARD. 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

## 2014-03-18 LAB — HM DIABETES EYE EXAM

## 2014-03-24 ENCOUNTER — Other Ambulatory Visit: Payer: Self-pay | Admitting: Cardiology

## 2014-03-24 NOTE — Telephone Encounter (Signed)
Rx(s) sent to pharmacy electronically.

## 2014-03-30 ENCOUNTER — Other Ambulatory Visit: Payer: Self-pay

## 2014-03-30 MED ORDER — FUROSEMIDE 40 MG PO TABS: 40.0000 mg | ORAL_TABLET | Freq: Every day | ORAL | Status: DC

## 2014-03-30 NOTE — Telephone Encounter (Signed)
Rx(s) sent to pharmacy electronically.

## 2014-04-07 ENCOUNTER — Other Ambulatory Visit: Payer: Self-pay | Admitting: Cardiology

## 2014-04-07 NOTE — Telephone Encounter (Signed)
Rx(s) sent to pharmacy electronically.

## 2014-05-02 ENCOUNTER — Ambulatory Visit (INDEPENDENT_AMBULATORY_CARE_PROVIDER_SITE_OTHER): Payer: PRIVATE HEALTH INSURANCE

## 2014-05-02 ENCOUNTER — Ambulatory Visit (INDEPENDENT_AMBULATORY_CARE_PROVIDER_SITE_OTHER): Payer: PRIVATE HEALTH INSURANCE | Admitting: Family Medicine

## 2014-05-02 ENCOUNTER — Emergency Department (HOSPITAL_BASED_OUTPATIENT_CLINIC_OR_DEPARTMENT_OTHER): Payer: No Typology Code available for payment source

## 2014-05-02 ENCOUNTER — Encounter (HOSPITAL_BASED_OUTPATIENT_CLINIC_OR_DEPARTMENT_OTHER): Payer: Self-pay | Admitting: Emergency Medicine

## 2014-05-02 ENCOUNTER — Emergency Department (HOSPITAL_BASED_OUTPATIENT_CLINIC_OR_DEPARTMENT_OTHER)
Admission: EM | Admit: 2014-05-02 | Discharge: 2014-05-02 | Disposition: A | Discharge status: Home / Self Care | Payer: No Typology Code available for payment source | Attending: Emergency Medicine | Admitting: Emergency Medicine

## 2014-05-02 VITALS — BP 142/78 | HR 70 | Temp 97.3°F | Resp 18 | Ht 74.0 in | Wt 328.0 lb

## 2014-05-02 DIAGNOSIS — G629 Polyneuropathy, unspecified: Secondary | ICD-10-CM | POA: Diagnosis not present

## 2014-05-02 DIAGNOSIS — R1110 Vomiting, unspecified: Secondary | ICD-10-CM | POA: Diagnosis not present

## 2014-05-02 DIAGNOSIS — K219 Gastro-esophageal reflux disease without esophagitis: Secondary | ICD-10-CM | POA: Diagnosis not present

## 2014-05-02 DIAGNOSIS — Z9889 Other specified postprocedural states: Secondary | ICD-10-CM | POA: Diagnosis not present

## 2014-05-02 DIAGNOSIS — M549 Dorsalgia, unspecified: Secondary | ICD-10-CM | POA: Insufficient documentation

## 2014-05-02 DIAGNOSIS — Z9981 Dependence on supplemental oxygen: Secondary | ICD-10-CM | POA: Diagnosis not present

## 2014-05-02 DIAGNOSIS — I129 Hypertensive chronic kidney disease with stage 1 through stage 4 chronic kidney disease, or unspecified chronic kidney disease: Secondary | ICD-10-CM | POA: Insufficient documentation

## 2014-05-02 DIAGNOSIS — Z79899 Other long term (current) drug therapy: Secondary | ICD-10-CM | POA: Insufficient documentation

## 2014-05-02 DIAGNOSIS — R635 Abnormal weight gain: Secondary | ICD-10-CM

## 2014-05-02 DIAGNOSIS — E119 Type 2 diabetes mellitus without complications: Secondary | ICD-10-CM | POA: Diagnosis not present

## 2014-05-02 DIAGNOSIS — R0602 Shortness of breath: Secondary | ICD-10-CM

## 2014-05-02 DIAGNOSIS — R197 Diarrhea, unspecified: Secondary | ICD-10-CM | POA: Diagnosis not present

## 2014-05-02 DIAGNOSIS — N182 Chronic kidney disease, stage 2 (mild): Secondary | ICD-10-CM | POA: Insufficient documentation

## 2014-05-02 DIAGNOSIS — I25119 Atherosclerotic heart disease of native coronary artery with unspecified angina pectoris: Secondary | ICD-10-CM | POA: Insufficient documentation

## 2014-05-02 DIAGNOSIS — Z880 Allergy status to penicillin: Secondary | ICD-10-CM | POA: Insufficient documentation

## 2014-05-02 DIAGNOSIS — E785 Hyperlipidemia, unspecified: Secondary | ICD-10-CM | POA: Insufficient documentation

## 2014-05-02 DIAGNOSIS — Z87891 Personal history of nicotine dependence: Secondary | ICD-10-CM | POA: Diagnosis not present

## 2014-05-02 DIAGNOSIS — R140 Abdominal distension (gaseous): Secondary | ICD-10-CM | POA: Diagnosis not present

## 2014-05-02 DIAGNOSIS — R109 Unspecified abdominal pain: Secondary | ICD-10-CM | POA: Diagnosis present

## 2014-05-02 DIAGNOSIS — Z794 Long term (current) use of insulin: Secondary | ICD-10-CM | POA: Diagnosis not present

## 2014-05-02 DIAGNOSIS — G4730 Sleep apnea, unspecified: Secondary | ICD-10-CM | POA: Diagnosis not present

## 2014-05-02 DIAGNOSIS — F329 Major depressive disorder, single episode, unspecified: Secondary | ICD-10-CM | POA: Diagnosis not present

## 2014-05-02 DIAGNOSIS — Z9861 Coronary angioplasty status: Secondary | ICD-10-CM | POA: Insufficient documentation

## 2014-05-02 DIAGNOSIS — E669 Obesity, unspecified: Secondary | ICD-10-CM | POA: Diagnosis not present

## 2014-05-02 DIAGNOSIS — E039 Hypothyroidism, unspecified: Secondary | ICD-10-CM | POA: Insufficient documentation

## 2014-05-02 LAB — CBC WITH DIFFERENTIAL/PLATELET
Basophils Absolute: 0 10*3/uL (ref 0.0–0.1)
Basophils Relative: 1 % (ref 0–1)
EOS ABS: 0.2 10*3/uL (ref 0.0–0.7)
Eosinophils Relative: 3 % (ref 0–5)
HCT: 38.9 % — ABNORMAL LOW (ref 39.0–52.0)
Hemoglobin: 13.6 g/dL (ref 13.0–17.0)
Lymphocytes Relative: 31 % (ref 12–46)
Lymphs Abs: 2.1 10*3/uL (ref 0.7–4.0)
MCH: 31.1 pg (ref 26.0–34.0)
MCHC: 35 g/dL (ref 30.0–36.0)
MCV: 89 fL (ref 78.0–100.0)
MONOS PCT: 10 % (ref 3–12)
Monocytes Absolute: 0.7 10*3/uL (ref 0.1–1.0)
NEUTROS PCT: 55 % (ref 43–77)
Neutro Abs: 3.8 10*3/uL (ref 1.7–7.7)
PLATELETS: 159 10*3/uL (ref 150–400)
RBC: 4.37 MIL/uL (ref 4.22–5.81)
RDW: 13.6 % (ref 11.5–15.5)
WBC: 6.8 10*3/uL (ref 4.0–10.5)

## 2014-05-02 LAB — COMPREHENSIVE METABOLIC PANEL
ALT: 47 U/L (ref 0–53)
AST: 35 U/L (ref 0–37)
Albumin: 4.1 g/dL (ref 3.5–5.2)
Alkaline Phosphatase: 55 U/L (ref 39–117)
Anion gap: 10 (ref 5–15)
BUN: 25 mg/dL — ABNORMAL HIGH (ref 6–23)
CHLORIDE: 100 mmol/L (ref 96–112)
CO2: 28 mmol/L (ref 19–32)
Calcium: 9.2 mg/dL (ref 8.4–10.5)
Creatinine, Ser: 1.28 mg/dL (ref 0.50–1.35)
GFR, EST AFRICAN AMERICAN: 69 mL/min — AB (ref >90)
GFR, EST NON AFRICAN AMERICAN: 59 mL/min — AB (ref >90)
GLUCOSE: 213 mg/dL — AB (ref 70–99)
POTASSIUM: 4 mmol/L (ref 3.5–5.1)
Sodium: 138 mmol/L (ref 135–145)
Total Bilirubin: 0.6 mg/dL (ref 0.3–1.2)
Total Protein: 7.2 g/dL (ref 6.0–8.3)

## 2014-05-02 LAB — URINALYSIS, ROUTINE W REFLEX MICROSCOPIC
BILIRUBIN URINE: NEGATIVE
GLUCOSE, UA: 500 mg/dL — AB
HGB URINE DIPSTICK: NEGATIVE
Ketones, ur: NEGATIVE mg/dL
Leukocytes, UA: NEGATIVE
Nitrite: NEGATIVE
Protein, ur: 100 mg/dL — AB
SPECIFIC GRAVITY, URINE: 1.026 (ref 1.005–1.030)
UROBILINOGEN UA: 1 mg/dL (ref 0.0–1.0)
pH: 7 (ref 5.0–8.0)

## 2014-05-02 LAB — I-STAT CG4 LACTIC ACID, ED
LACTIC ACID, VENOUS: 2.36 mmol/L — AB (ref 0.5–2.0)
Lactic Acid, Venous: 2.03 mmol/L (ref 0.5–2.0)

## 2014-05-02 LAB — URINE MICROSCOPIC-ADD ON

## 2014-05-02 LAB — TROPONIN I: Troponin I: 0.03 ng/mL (ref <0.031)

## 2014-05-02 LAB — LIPASE, BLOOD: Lipase: 39 U/L (ref 11–59)

## 2014-05-02 LAB — BRAIN NATRIURETIC PEPTIDE: B Natriuretic Peptide: 27.8 pg/mL (ref 0.0–100.0)

## 2014-05-02 MED ORDER — IOHEXOL 300 MG/ML  SOLN
100.0000 mL | Freq: Once | INTRAMUSCULAR | Status: AC | PRN
  Administered 2014-05-02: 100 mL via INTRAVENOUS

## 2014-05-02 MED ORDER — ONDANSETRON HCL 4 MG/2ML IJ SOLN
4.0000 mg | Freq: Once | INTRAMUSCULAR | Status: AC
Start: 2014-05-02 — End: 2014-05-02
  Administered 2014-05-02: 4 mg via INTRAVENOUS
  Filled 2014-05-02: qty 2

## 2014-05-02 MED ORDER — SODIUM CHLORIDE 0.9 % IV BOLUS (SEPSIS)
1000.0000 mL | Freq: Once | INTRAVENOUS | Status: AC
  Administered 2014-05-02: 1000 mL via INTRAVENOUS

## 2014-05-02 MED ORDER — MORPHINE SULFATE 4 MG/ML IJ SOLN
4.0000 mg | Freq: Once | INTRAMUSCULAR | Status: AC
  Administered 2014-05-02: 4 mg via INTRAVENOUS
  Filled 2014-05-02: qty 1

## 2014-05-02 MED ORDER — IOHEXOL 300 MG/ML  SOLN
50.0000 mL | Freq: Once | INTRAMUSCULAR | Status: AC | PRN
  Administered 2014-05-02: 50 mL via ORAL

## 2014-05-02 NOTE — ED Provider Notes (Signed)
CSN: [id number]     Arrival date & time 05/02/14  1655 History   This chart was scribed for Ezequiel Essex, MD by Evelene Croon, ED Scribe. This patient was seen in room MH08/MH08 and the patient's care was started 7:27 PM.    Chief Complaint  Patient presents with  . Abdominal Pain   The history is provided by the patient. No language interpreter was used.     HPI Comments:  Harold [name] is a 59 y.o. male who presents to the Emergency Department complaining of Diarrhea that started 3 days ago. He reports 2 episodes of diarrhea for the last 2 days and 1 today. Pt reports associated abdominal pain; worse with palpation. He notes he's had same abdominal pain  for "awhile" but seem worse . He denies fever, blood in stool, vomiting, CP, SOB, dizziness and light headedness. He also denies recent travel outside the country, recent use of abx, and h/o similar episode.  Pt was sent from urgent medical for evaluation. No alleviating factors noted.   Past Medical History  Diagnosis Date  . Diabetes mellitus   . Carotid artery occlusion   . Complication of anesthesia   . PONV (postoperative nausea and vomiting)     "[place]"  . Hypothyroidism   . Depression     situaltional   . Neuropathy     notably improved following PCI with improved cardiac function  . CAD S/P percutaneous coronary angioplasty 2003, 04/2012    status post PCI to LAD, circumflex-OM 2, RCA  . 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 Dyslipidemia associated with type 2 diabetes mellitus   . GERD (gastroesophageal reflux disease)   . Chronic renal insufficiency, stage II (mild)   . Pulmonary hypertension 04/2012    PA pressure 86mhg  . HTN (hypertension)   . Chronic venous insufficiency     varicosities, no reflux; dopplers 04/14/12- valvular insufficiency in the R and L GSV  . Anginal pain March 2015    Cardiac cath showed  patent stents with distal LAD, circumflex-OM and RCA disease in small vessels.  . Obesity   . Sleep apnea     uses nightly  . Neuromuscular disorder     neuropathy in feet   Past Surgical History  Procedure Laterality Date  . Umbilical hernia repair  2009    steel mesh insert  . Coronary angioplasty  12/21/2001    PTCA of the distal and mid AV groove circ, unsuccessful PTCA of second OM total occlusion, unsuccessful PTCA of the apical LAD total occlusion  . Colonscopy    . Carotid endarterectomy  2005    Right; recent carotid Dopplers notes elevated velocities.   . Back surgery  2005 x1    X2-2010  . Lumbar laminectomy/decompression microdiscectomy  01/07/2012    Procedure: LUMBAR LAMINECTOMY/DECOMPRESSION MICRODISCECTOMY 1 LEVEL;  Surgeon: KWinfield Cunas MD;  [facility];  Service: Neurosurgery;  Laterality: Bilateral;   Lumbar Three-Four Decompression  . Coronary stent placement  04/28/12    PCI to LAD (3x216mXience DES postdilated to 3.25) and circ prox and mid (2 overlappinmg 2.3m59m2mmXience DES postdilated to 2.73m98m. Cardiac catheterization  12/11/2001    significant 3V CAD, normal LV function  . Coronary angioplasty with stent placement  04/29/12    PCI to 3 RCA lesions, Promus Premiere 2.23mm26m distally, mid was 2.3mx2861mand  proximally 2.75x63m, EF 55-60%  . Doppler echocardiography  04/28/2012    poor quality study: EF estimated 60-65%; unable to assess diastolic function (previously noted to have diastolic dysfunction); severely dilated left atrium and mild right atrium; dilated IVC consistent with increased central venous pressure..  . Carotid doppler  04/27/2012    right internal carotid: Elevated velocities but no evidence of plaque. Left internal carotid 40-59%  . Abis  04/27/2012    mild bilateral arterial insufficiency  . Cardiac catheterization  March 2015    Moderate Pulm HTN: 46/16 - mean 33 mmHg; PCWP 230mg;; multivessel CAD with widely patent mid  LAD stents and 90% apical LAD, widely patent proximal and mid cervical extends with previous subtotal occlusion of smaller tube, I be patent RCA ostial mid and distal stents with 70% distal continuation RCA disease  . Bunionectomy Right 11/11/2013    Procedure: RIGHT FOOT SILVER BUNIONECTOMY;  Surgeon: JoWylene SimmerMD;  [place] [facility];  Laterality: Right;  . Abdominal aortagram N/A 11/15/2011    Procedure: ABDOMINAL AORTAGRAM;  Surgeon: ChElam DutchMD;  [facility];  Service: Cardiovascular;  [name];  . Left and right heart catheterization with coronary angiogram N/A 04/27/2012    Procedure: LEFT AND RIGHT HEART CATHETERIZATION WITH CORONARY ANGIOGRAM;  Surgeon: DaLeonie ManMD;  [facility];  Service: Cardiovascular;  [name];  . Percutaneous coronary stent intervention (pci-s) N/A 04/28/2012    Procedure: PERCUTANEOUS CORONARY STENT INTERVENTION (PCI-S);  Surgeon: ThTroy SineMD;  [facility];  Service: Cardiovascular;  [name];  . Percutaneous coronary stent intervention (pci-s) N/A 04/29/2012    Procedure: PERCUTANEOUS CORONARY STENT INTERVENTION (PCI-S);  Surgeon: DaLeonie ManMD;  [facility];  Service: Cardiovascular;  [name];  . Left and right heart catheterization with coronary angiogram N/A 05/14/2013    Procedure: LEFT AND RIGHT HEART CATHETERIZATION WITH CORONARY ANGIOGRAM;  Surgeon: ThTroy SineMD;  [facility];  Service: Cardiovascular;  [name];   Family History  Problem Relation Age of Onset  . Adopted: Yes  . Family history unknown: Yes   History  Substance Use Topics  . Smoking status: Former Smoker -- 1.00 packs/day for 42 years    Types: Cigarettes    Quit date: 03/23/2003  . Smokeless tobacco: Never Used  . Alcohol Use: No    Review of Systems  Constitutional: Negative for fever.  Respiratory: Negative for shortness  of breath.   Cardiovascular: Negative for chest pain.  Gastrointestinal: Positive for vomiting, abdominal pain and diarrhea. Negative for blood in stool.  Musculoskeletal: Positive for back pain.  Neurological: Negative for dizziness, weakness and light-headedness.  All other systems reviewed and are negative.     Allergies  Septra; Penicillins; Clopidogrel; and Other  Home Medications   Prior to Admission medications   Medication Sig Start Date End Date Taking? Authorizing Provider  carvedilol (COREG) 6.25 MG tablet TAKE 1 TABLET (6.25 MG TOTAL) BY MOUTH 2 (TWO) TIMES DAILY. 03/04/14   DaLeonie ManMD  cholecalciferol (VITAMIN D) 1000 UNITS tablet Take 1,000 Units by mouth 4 (four) times daily.     Historical Provider, MD  Choline Fenofibrate (FENOFIBRIC ACID) 135 MG CPDR Take 1 capsule by mouth daily. 01/06/14   Historical Provider, MD  escitalopram (LEXAPRO) 20 MG tablet Take 1 tablet (20 mg total) by mouth daily. 02/16/14   ToJulieta GuttingPA  esomeprazole (  NEXIUM) 40 MG capsule TAKE 40 MG BY MOUTH DAILY BEFORE BREAKFAST. 04/07/14   Leonie Man, MD  felodipine (PLENDIL) 10 MG 24 hr tablet TAKE 1 TABLET (10 MG TOTAL) BY MOUTH DAILY. 03/04/14   Leonie Man, MD  fish oil-omega-3 fatty acids 1000 MG capsule Take 1,200 mg by mouth daily.     Historical Provider, MD  furosemide (LASIX) 40 MG tablet Take 1 tablet (40 mg total) by mouth daily. Patient taking differently: Take 20 mg by mouth 2 (two) times daily.  03/30/14   Leonie Man, MD  Garlic 903 MG CAPS Take 500 mg by mouth daily.    Historical Provider, MD  [name] KWIKPEN 100 UNIT/ML Mayer Masker  01/04/14   Historical Provider, MD  insulin aspart (NOVOLOG) 100 UNIT/ML injection Inject 50 Units into the skin 2 (two) times daily.  04/29/11   [name], MD  insulin NPH (HUMULIN N,NOVOLIN N) 100 UNIT/ML injection Inject 75 Units into the skin 2 (two) times daily before a meal.     Historical Provider, MD  Iodine, Kelp, 0.15  MG TABS Take by mouth daily.    Historical Provider, MD  levothyroxine (SYNTHROID, LEVOTHROID) 88 MCG tablet Take 88 mcg by mouth daily before breakfast.    Historical Provider, MD  lisinopril (PRINIVIL,ZESTRIL) 20 MG tablet Take 0.5 tablets (10 mg total) by mouth daily. 03/04/14   Leonie Man, MD  metFORMIN (GLUCOPHAGE) 1000 MG tablet Take 1 tablet (1,000 mg total) by mouth 2 (two) times daily with a meal. Patient taking differently: Take 1,000 mg by mouth at bedtime.  05/17/13   Isaiah Serge, NP  Misc Natural Products (LUTEIN 20 PO) Take 20 mg by mouth daily.    Historical Provider, MD  Multiple Vitamins-Minerals (MULTIVITAMIN WITH MINERALS) tablet Take 1 tablet by mouth daily.    Historical Provider, MD  NITROSTAT 0.4 MG SL tablet PLACE 1 TAB UNDER THE TONGUE EVERY 5 MINUTES AS NEEDED FOR CHEST PAIN (SEVERE PRESSURE OR TIGHTNESS) 03/24/14   Leonie Man, MD  NOVOLOG FLEXPEN 100 UNIT/ML FlexPen  01/04/14   Historical Provider, MD  ticagrelor (BRILINTA) 90 MG TABS tablet TAKE 1 TABLET (90 MG TOTAL) BY MOUTH 2 (TWO) TIMES DAILY. 03/04/14   Leonie Man, MD  TURMERIC PO Take 800 mg by mouth daily.    Historical Provider, MD  vitamin C (ASCORBIC ACID) 500 MG tablet Take 1,000 mg by mouth daily.    Historical Provider, MD   BP 148/69 mmHg  Pulse 59  Temp(Src) 97.9 F (36.6 C) (Oral)  Resp 16  Ht _0  (1.88 m)  Wt 328 lb (148.78 kg)  BMI 42.09 kg/m2  SpO2 97% Physical Exam  Constitutional: He is oriented to person, place, and time. He appears well-developed. No distress.  Obese   HENT:  Head: Normocephalic and atraumatic.  Mouth/Throat: Oropharynx is clear and moist. No oropharyngeal exudate.  Eyes: Conjunctivae and EOM are normal. Pupils are equal, round, and reactive to light.  Neck: Normal range of motion. Neck supple.  No meningismus.  Cardiovascular: Normal rate, regular rhythm, normal heart sounds and intact distal pulses.   No murmur heard. Pulmonary/Chest: Effort normal  and breath sounds normal. No respiratory distress.  Abdominal: Soft. He exhibits distension. There is tenderness. There is guarding. There is no rebound.  Distended abdomen TTP epigastrum and LLQ with guarding  No CVA TTP    Genitourinary: Rectum normal.  Rectal tone normal No gross blood noted  Musculoskeletal:  Normal range of motion. He exhibits no edema or tenderness.  5/5 strength in bilateral lower extremities. Ankle plantar and dorsiflexion intact. Great toe extension intact bilaterally. +2 DP and PT pulses. +2 patellar reflexes bilaterally. Normal gait.   Neurological: He is alert and oriented to person, place, and time. No cranial nerve deficit. He exhibits normal muscle tone. Coordination normal.  No ataxia on finger to nose bilaterally. No pronator drift. 5/5 strength throughout. CN 2-12 intact. Negative Romberg. Equal grip strength. Sensation intact. Gait is normal.   Skin: Skin is warm.  Psychiatric: He has a normal mood and affect. His behavior is normal.  Nursing note and vitals reviewed.   ED Course  Procedures   DIAGNOSTIC STUDIES:  Oxygen Saturation is 97% on room air, normal by my interpretation.    COORDINATION OF CARE:  2:32 AM Discussed treatment plan with pt at bedside and pt agreed to plan.  Labs Review Labs Reviewed  CBC WITH DIFFERENTIAL/PLATELET - Abnormal; Notable for the following:    HCT 38.9 (*)    All other components within normal limits  COMPREHENSIVE METABOLIC PANEL - Abnormal; Notable for the following:    Glucose, Bld 213 (*)    BUN 25 (*)    GFR calc non Af Amer 59 (*)    GFR calc Af Amer 69 (*)    All other components within normal limits  URINALYSIS, ROUTINE W REFLEX MICROSCOPIC - Abnormal; Notable for the following:    Glucose, UA 500 (*)    Protein, ur 100 (*)    All other components within normal limits  I-STAT CG4 LACTIC ACID, ED - Abnormal; Notable for the following:    Lactic Acid, Venous 2.36 (*)    All other components  within normal limits  I-STAT CG4 LACTIC ACID, ED - Abnormal; Notable for the following:    Lactic Acid, Venous 2.03 (*)    All other components within normal limits  LIPASE, BLOOD  URINE MICROSCOPIC-ADD ON  TROPONIN I  BRAIN NATRIURETIC PEPTIDE  I-STAT CG4 LACTIC ACID, ED    Imaging Review Dg Chest 2 View  05/02/2014   CLINICAL DATA:  Shortness of Breath  EXAM: CHEST  2 VIEW  COMPARISON:  02/16/2014  FINDINGS: The heart size and mediastinal contours are within normal limits. Both lungs are clear. The visualized skeletal structures are unremarkable.  IMPRESSION: No active cardiopulmonary disease.   Electronically Signed   By: Inez Catalina M.D.   On: 05/02/2014 16:27   Ct Abdomen Pelvis W Contrast  05/02/2014   CLINICAL DATA:  Left lower quadrant pain for 1 day.  EXAM: CT ABDOMEN AND PELVIS WITH CONTRAST  TECHNIQUE: Multidetector CT imaging of the abdomen and pelvis was performed using the standard protocol following bolus administration of intravenous contrast.  CONTRAST:  74m OMNIPAQUE IOHEXOL 300 MG/ML SOLN, 1072mOMNIPAQUE IOHEXOL 300 MG/ML SOLN  COMPARISON:  Radiographs 05/02/2014  FINDINGS: Lower chest:  No significant abnormality  Hepatobiliary: There are normal appearances of the liver, gallbladder and bile ducts.  Pancreas: Normal  Spleen: Normal  Adrenals/Urinary Tract: The adrenals and kidneys are normal in appearance. There is no urinary calculus evident. There is no hydronephrosis or ureteral dilatation. Collecting systems and ureters appear unremarkable.  Stomach/Bowel: The stomach, small bowel and colon are remarkable only for mild uncomplicated colonic diverticulosis. The appendix is normal  Vascular/Lymphatic: The abdominal aorta is normal in caliber. There is mild atherosclerotic calcification. There is no adenopathy in the abdomen or pelvis.  Other: There is  no inflammatory change in the abdomen or pelvis. There is no ascites. There is prior ventral herniorrhaphy.  Musculoskeletal:  Prior decompression and instrumented fusion from L4 through S1. No acute musculoskeletal findings.  IMPRESSION: Mild diverticulosis. No evidence of diverticulitis or other acute process in the abdomen or pelvis.   Electronically Signed   By: Andreas Newport M.D.   On: 05/02/2014 21:10   Dg Abd 2 Views  05/02/2014   CLINICAL DATA:  Weight gain  EXAM: ABDOMEN - 2 VIEW  COMPARISON:  None.  FINDINGS: Supine and upright images were obtained. There is moderate stool in the colon. There is no bowel dilatation or air-fluid level suggesting obstruction. No free air. There are coils in the mid-pelvis. There is postoperative change in the lower lumbar spine.  IMPRESSION: Bowel gas pattern unremarkable.  No obstruction or free air.   Electronically Signed   By: Lowella Grip III M.D.   On: 05/02/2014 16:31     EKG Interpretation   Date/Time:  Monday May 02 2014 22:18:58 EDT Ventricular Rate:  59 PR Interval:  160 QRS Duration: 100 QT Interval:  434 QTC Calculation: 429 R Axis:   19 Text Interpretation:  Sinus bradycardia Nonspecific T wave abnormality  Abnormal ECG No significant change was found Confirmed by Wyvonnia Dusky  MD,  Feleshia Zundel [phone]) on 05/02/2014 10:28:27 PM      MDM   Final diagnoses:  Diarrhea  Abdominal pain, unspecified abdominal location   3 days of lower abdominal pain with diarrhea. No fever or vomiting. Exacerbation of chronic back pain without focal weakness, numbness or tingling.  Abdomen nonsurgical tenderness in the left lower quadrant.  Mildly elevated lactate 2.3.  Hyperglycemia without DKA. CT shows diverticulosis without diverticulitis. Patient feeling improved. No diarrhea in the ED.  Patient reports episode of central chest tightness 3 days ago that onset while he was rushing to bathroom and resolved after 2-3 minutes. No further chest pain.  Patent stents on cath last year.  EKG today unchanged. Troponin negative.  D/w cardiology Dr. Aundra Dubin who feels patient  can follow up as outpatient with Dr. Ellyn Hack and no need for admission rule out or repeat troponin as episode happened several days ago.  Lactate improved to 2.0  Abdomen soft.  Patient tolerating PO.  Metformin may be contributing as he has had issues with it in the past. Advised to contact PCP to stop potentially stop metformin.  Follow up with cards as well. Return precautions discussed.    I personally performed the services described in this documentation, which was scribed in my presence. The recorded information has been reviewed and is accurate.   Ezequiel Essex, MD 05/03/14 [phone]

## 2014-05-02 NOTE — Patient Instructions (Signed)
Go to [facility] for further evaluation - they are expecting you.

## 2014-05-02 NOTE — Progress Notes (Signed)
Subjective:    Patient ID: Harold [name], male    DOB: [birth year], 59 y.o.   MRN: [id number]  HPI  This is a 59 year old male with PMH CAD, DM, HTN and CHF who is presenting with nonbloody diarrhea x 3 days. Having diarrhea 3x a day - watery and dark colored. Having lower abdominal cramping. No N/V, fever, chills, urinary symptoms. Had a recent change in his metformin 1 month ago - thinks he was switched to XR instead of regular metformin, although he is unsure. States over the past 3 weeks he has gained 16 pounds - 8 pounds of weight gain in the past 1 week. Pt states he has been watching his diet closely and eating well. He feels his abdomen is distended. He is not He has also noticed some increased SOB, gradually worsening over the past 1 month. States he can walk about 50 feet before becoming SOB. Was able to walk long distances before without becoming SOB. He had an instance of substernal CP that lasted 5 minutes 3 days ago while running to the bathroom to have diarrhea. States that it is normal for him to have CP with exertion. He has nitrostat to take prn - did not have to take for episode 3 days ago. He takes lasix 20 mg BID. No current orthopnea.  Pt is coming in in 2 days to discuss intermittent right arm numbness for the past 3 months with Dr. Everlene Farrier.  Review of Systems  Constitutional: Negative for fever and chills.  Eyes: Negative for redness and visual disturbance.  Respiratory: Positive for shortness of breath. Negative for cough.   Cardiovascular: Positive for chest pain and leg swelling. Negative for palpitations.  Gastrointestinal: Positive for abdominal pain, diarrhea and abdominal distention. Negative for nausea, vomiting and blood in stool.  Genitourinary: Negative for dysuria.  Skin: Negative for rash.  Neurological: Positive for numbness.  Hematological: Negative for adenopathy.   Prior to Admission medications   Medication Sig Start Date End Date Taking?  Authorizing Provider  carvedilol (COREG) 6.25 MG tablet TAKE 1 TABLET (6.25 MG TOTAL) BY MOUTH 2 (TWO) TIMES DAILY. 03/04/14  Yes Leonie Man, MD  cholecalciferol (VITAMIN D) 1000 UNITS tablet Take 1,000 Units by mouth 4 (four) times daily.    Yes Historical Provider, MD  Choline Fenofibrate (FENOFIBRIC ACID) 135 MG CPDR Take 1 capsule by mouth daily. 01/06/14  Yes Historical Provider, MD  escitalopram (LEXAPRO) 20 MG tablet Take 1 tablet (20 mg total) by mouth daily. 02/16/14  Yes Merlinda Frederick McVeigh, PA  esomeprazole (NEXIUM) 40 MG capsule TAKE 40 MG BY MOUTH DAILY BEFORE BREAKFAST. 04/07/14  Yes Leonie Man, MD  felodipine (PLENDIL) 10 MG 24 hr tablet TAKE 1 TABLET (10 MG TOTAL) BY MOUTH DAILY. 03/04/14  Yes Leonie Man, MD  fish oil-omega-3 fatty acids 1000 MG capsule Take 1,200 mg by mouth daily.    Yes Historical Provider, MD  furosemide (LASIX) 40 MG tablet Take 1 tablet (40 mg total) by mouth daily. Patient taking differently: Take 20 mg by mouth 2 (two) times daily.  03/30/14  Yes Leonie Man, MD  Garlic 341 MG CAPS Take 500 mg by mouth daily.   Yes Historical Provider, MD  [name] KWIKPEN 100 UNIT/ML Kiwkpen  01/04/14  Yes Historical Provider, MD  insulin aspart (NOVOLOG) 100 UNIT/ML injection Inject 50 Units into the skin 2 (two) times daily.  04/29/11  Yes [name], MD  insulin NPH (  HUMULIN N,NOVOLIN N) 100 UNIT/ML injection Inject 75 Units into the skin 2 (two) times daily before a meal.    Yes Historical Provider, MD  Iodine, Kelp, 0.15 MG TABS Take by mouth daily.   Yes Historical Provider, MD  levothyroxine (SYNTHROID, LEVOTHROID) 88 MCG tablet Take 88 mcg by mouth daily before breakfast.   Yes Historical Provider, MD  lisinopril (PRINIVIL,ZESTRIL) 20 MG tablet Take 0.5 tablets (10 mg total) by mouth daily. 03/04/14  Yes Leonie Man, MD  metFORMIN (GLUCOPHAGE) 1000 MG tablet Take 1 tablet (1,000 mg total) by mouth 2 (two) times daily with a meal. Patient taking  differently: Take 1,000 mg by mouth at bedtime.  05/17/13  Yes Isaiah Serge, NP  Misc Natural Products (LUTEIN 20 PO) Take 20 mg by mouth daily.   Yes Historical Provider, MD  Multiple Vitamins-Minerals (MULTIVITAMIN WITH MINERALS) tablet Take 1 tablet by mouth daily.   Yes Historical Provider, MD  NITROSTAT 0.4 MG SL tablet PLACE 1 TAB UNDER THE TONGUE EVERY 5 MINUTES AS NEEDED FOR CHEST PAIN (SEVERE PRESSURE OR TIGHTNESS) 03/24/14  Yes Leonie Man, MD  NOVOLOG FLEXPEN 100 UNIT/ML FlexPen  01/04/14  Yes Historical Provider, MD  ticagrelor (BRILINTA) 90 MG TABS tablet TAKE 1 TABLET (90 MG TOTAL) BY MOUTH 2 (TWO) TIMES DAILY. 03/04/14  Yes Leonie Man, MD  TURMERIC PO Take 800 mg by mouth daily.   Yes Historical Provider, MD  vitamin C (ASCORBIC ACID) 500 MG tablet Take 1,000 mg by mouth daily.   Yes Historical Provider, MD   Allergies  Allergen Reactions  . Septra [Bactrim] Itching  . Penicillins Hives    Allergy to All cillin drugs  Ancef given 9/24 with no obvious reaction  . Clopidogrel     NON RESPONDER  P2Y12  . Other     Walnuts, pecans    Patient's social and family history were reviewed.     Objective:   Physical Exam  Constitutional: He is oriented to person, place, and time. He appears well-developed and well-nourished. No distress.  HENT:  Head: Normocephalic and atraumatic.  Right Ear: Hearing normal.  Left Ear: Hearing normal.  Nose: Nose normal.  Eyes: Conjunctivae and lids are normal. Right eye exhibits no discharge. Left eye exhibits no discharge. No scleral icterus.  Neck: Carotid bruit is not present.  Cardiovascular: Normal rate, regular rhythm, normal heart sounds, intact distal pulses and normal pulses.   No murmur heard. Pulmonary/Chest: Effort normal and breath sounds normal. No respiratory distress. He has no wheezes. He has no rhonchi. He has no rales.  Abdominal: Bowel sounds are normal. He exhibits distension. He exhibits no abdominal bruit. There  is tenderness (lower abdomen and RUQ).  Musculoskeletal: Normal range of motion.  Lymphadenopathy:    He has no cervical adenopathy.  Neurological: He is alert and oriented to person, place, and time.  Skin: Skin is warm, dry and intact.  Bilateral LE 2+ pitting edema to mid tibia  Psychiatric: He has a normal mood and affect. His speech is normal and behavior is normal. Thought content normal.   BP 142/78 mmHg  Pulse 70  Temp(Src) 97.3 F (36.3 C) (Oral)  Resp 18  Ht _0  (1.88 m)  Wt 328 lb (148.78 kg)  BMI 42.09 kg/m2  SpO2 97%   Wt Readings from Last 3 Encounters:  05/02/14 328 lb (148.78 kg)  02/16/14 311 lb (141.069 kg)  02/07/14 317 lb (143.79 kg)    [facility] reading (  PRIMARY) by  Dr. Lorelei Pont: abdomen and chest negative  EKG interpreted by Dr. Lorelei Pont: NSR, no change from prior 09/2013    Assessment & Plan:  1. Weight gain 2. SOB 3. Diarrhea 4. Abdominal distension Worried about heart failure vs liver/kidney involvement vs. Colitis. Weight gain is concerning. EKG and chest/abdomen films normal. Pt will drive himself to [facility] for further evaluation - specifically BNP, CMP, possible abdominal CT.  - EKG 12-Lead - DG Abd 2 Views; Future - DG Chest 2 View; Future   Benjaman Pott. Drenda Freeze, MHS [facility]  05/02/2014

## 2014-05-02 NOTE — ED Notes (Signed)
Pt has completed drinking the oral contrast.

## 2014-05-02 NOTE — Discharge Instructions (Signed)
Diarrhea Follow-up with your doctor this week. Call Dr. Ellyn Hack for an appointment. Let Dr. Everlene Farrier know that you are stopping your metformin. Return to the ED if you develop new or worsening symptoms. Diarrhea is frequent loose and watery bowel movements. It can cause you to feel weak and dehydrated. Dehydration can cause you to become tired and thirsty, have a dry mouth, and have decreased urination that often is dark yellow. Diarrhea is a sign of another problem, most often an infection that will not last long. In most cases, diarrhea typically lasts 2-3 days. However, it can last longer if it is a sign of something more serious. It is important to treat your diarrhea as directed by your caregiver to lessen or prevent future episodes of diarrhea. CAUSES  Some common causes include:  Gastrointestinal infections caused by viruses, bacteria, or parasites.  Food poisoning or food allergies.  Certain medicines, such as antibiotics, chemotherapy, and laxatives.  Artificial sweeteners and fructose.  Digestive disorders. HOME CARE INSTRUCTIONS  Ensure adequate fluid intake (hydration): Have 1 cup (8 oz) of fluid for each diarrhea episode. Avoid fluids that contain simple sugars or sports drinks, fruit juices, whole milk products, and sodas. Your urine should be clear or pale yellow if you are drinking enough fluids. Hydrate with an oral rehydration solution that you can purchase at pharmacies, retail stores, and online. You can prepare an oral rehydration solution at home by mixing the following ingredients together:   - tsp table salt.   tsp baking soda.   tsp salt substitute containing potassium chloride.  1  tablespoons sugar.  1 L (34 oz) of water.  Certain foods and beverages may increase the speed at which food moves through the gastrointestinal (GI) tract. These foods and beverages should be avoided and include:  Caffeinated and alcoholic beverages.  High-fiber foods, such as raw  fruits and vegetables, nuts, seeds, and whole grain breads and cereals.  Foods and beverages sweetened with sugar alcohols, such as xylitol, sorbitol, and mannitol.  Some foods may be well tolerated and may help thicken stool including:  Starchy foods, such as rice, toast, pasta, low-sugar cereal, oatmeal, grits, baked potatoes, crackers, and bagels.  Bananas.  Applesauce.  Add probiotic-rich foods to help increase healthy bacteria in the GI tract, such as yogurt and fermented milk products.  Wash your hands well after each diarrhea episode.  Only take over-the-counter or prescription medicines as directed by your caregiver.  Take a warm bath to relieve any burning or pain from frequent diarrhea episodes. SEEK IMMEDIATE MEDICAL CARE IF:   You are unable to keep fluids down.  You have persistent vomiting.  You have blood in your stool, or your stools are black and tarry.  You do not urinate in 6-8 hours, or there is only a small amount of very dark urine.  You have abdominal pain that increases or localizes.  You have weakness, dizziness, confusion, or light-headedness.  You have a severe headache.  Your diarrhea gets worse or does not get better.  You have a fever or persistent symptoms for more than 2-3 days.  You have a fever and your symptoms suddenly get worse. MAKE SURE YOU:   Understand these instructions.  Will watch your condition.  Will get help right away if you are not doing well or get worse. Document Released: 01/25/2002 Document Revised: 06/21/2013 Document Reviewed: 10/13/2011 [facility] Patient Information 2015 [place], [place]. This information is not intended to replace advice given to you by your  health care provider. Make sure you discuss any questions you have with your health care provider.

## 2014-05-02 NOTE — Progress Notes (Signed)
I have read and agree with note by Ms. Tawni Pummel.  I have also examined pt and agree with ER consultation

## 2014-05-02 NOTE — ED Notes (Signed)
Pt states he has been having diarrhea for the past several days

## 2014-05-04 ENCOUNTER — Ambulatory Visit (INDEPENDENT_AMBULATORY_CARE_PROVIDER_SITE_OTHER): Payer: PRIVATE HEALTH INSURANCE

## 2014-05-04 ENCOUNTER — Ambulatory Visit (INDEPENDENT_AMBULATORY_CARE_PROVIDER_SITE_OTHER): Payer: PRIVATE HEALTH INSURANCE | Admitting: Emergency Medicine

## 2014-05-04 VITALS — BP 138/80 | HR 75 | Temp 98.0°F | Resp 17 | Ht 73.5 in | Wt 328.0 lb

## 2014-05-04 DIAGNOSIS — R200 Anesthesia of skin: Secondary | ICD-10-CM

## 2014-05-04 DIAGNOSIS — R079 Chest pain, unspecified: Secondary | ICD-10-CM | POA: Diagnosis not present

## 2014-05-04 DIAGNOSIS — G589 Mononeuropathy, unspecified: Secondary | ICD-10-CM

## 2014-05-04 DIAGNOSIS — R202 Paresthesia of skin: Secondary | ICD-10-CM

## 2014-05-04 DIAGNOSIS — R635 Abnormal weight gain: Secondary | ICD-10-CM | POA: Diagnosis not present

## 2014-05-04 DIAGNOSIS — R1013 Epigastric pain: Secondary | ICD-10-CM

## 2014-05-04 LAB — POCT CBC
Granulocyte percent: 63.4 %G (ref 37–80)
HEMATOCRIT: 40.5 % — AB (ref 43.5–53.7)
HEMOGLOBIN: 13.4 g/dL — AB (ref 14.1–18.1)
LYMPH, POC: 2 (ref 0.6–3.4)
MCH: 30.3 pg (ref 27–31.2)
MCHC: 33.1 g/dL (ref 31.8–35.4)
MCV: 91.4 fL (ref 80–97)
MID (cbc): 0.6 (ref 0–0.9)
MPV: 8.1 fL (ref 0–99.8)
POC Granulocyte: 4.4 (ref 2–6.9)
POC LYMPH PERCENT: 28.4 %L (ref 10–50)
POC MID %: 8.2 %M (ref 0–12)
Platelet Count, POC: 174 10*3/uL (ref 142–424)
RBC: 4.43 M/uL — AB (ref 4.69–6.13)
RDW, POC: 14 %
WBC: 6.9 10*3/uL (ref 4.6–10.2)

## 2014-05-04 LAB — BASIC METABOLIC PANEL
BUN: 21 mg/dL (ref 6–23)
CALCIUM: 9.5 mg/dL (ref 8.4–10.5)
CO2: 28 mEq/L (ref 19–32)
CREATININE: 1.24 mg/dL (ref 0.50–1.35)
Chloride: 101 mEq/L (ref 96–112)
GLUCOSE: 234 mg/dL — AB (ref 70–99)
POTASSIUM: 4.6 meq/L (ref 3.5–5.3)
SODIUM: 138 meq/L (ref 135–145)

## 2014-05-04 LAB — BRAIN NATRIURETIC PEPTIDE: Brain Natriuretic Peptide: 39.4 pg/mL (ref 0.0–100.0)

## 2014-05-04 MED ORDER — HYDROCODONE-ACETAMINOPHEN 5-325 MG PO TABS: 1.0000 | ORAL_TABLET | Freq: Four times a day (QID) | ORAL | Status: DC | PRN

## 2014-05-04 NOTE — Progress Notes (Addendum)
Subjective:  This chart was scribed for Nena Jordan, MD by Mercy Moore, Medial Scribe. This patient was seen in room 10 and the patient's care was started at 9:15 AM.    Patient ID: Harold [name], male    DOB: [birth year], 59 y.o.   MRN: [id number] Chief Complaint  Patient presents with  . Arm Pain  . Follow-up    abdominal pain     HPI HPI Comments: Harold [name] is a 59 y.o. male who presents to the [facility] complaining of right arm tingling ongoing for three months and for follow up of his diarrhea. Patient reports onset of uncontrollable diarrhea four days ago; patient evaluated here for his symptoms, but he was sent to the ED for higher acuity work up. Per chart history patient seen at [facility] two days ago for three day history of diarrhea and ongoing abdominal pain. Patient told to consult his PCP, Dr. Ellyn Hack, about potentially discontinuing Metformin. Patient reports continued abdominal pain, especially to RUQ, and loose stools since his ED visit and suspending Metformin treatment two days ago. Patient also reports unexpected weight of 15lbs, discovered at his last visit despite taking a diuretic and healthier diet. Patient reports feeling bloated.  Imaging performed 3/14 includes CT of abdomen and X-rays of chest and abdomen all resulting negatively.  Ultrasound of left carotid in 01/2014: showed less than 50% stenosis.   Patient reports intermittent numbing/tingling sensation in his right arm and hand. Patient reports onset of sensation with walking, naturally dangling arm and rolling over on his side. Patient reports reduced grip strength, stating that sometimes he has difficulty lifting objects. Patient currently experiencing sensation during his examination.    Patient Active Problem List   Diagnosis Date Noted  . Gastroesophageal reflux disease without esophagitis 10/10/2013  . Chronic diastolic heart failure, NYHA class 2 -  PCWPf 22 mmHg during catheterization. 06/27/2013  . Dyspnea 06/17/2013  . COPD (chronic obstructive pulmonary disease) 06/17/2013  . Restrictive lung disease 06/17/2013  . Myalgia and myositis 04/20/2013  . Aftercare following surgery of the circulatory system, [place]ol[place]o 02/22/2013  . Obesity, Class III, BMI 40-49.9 (morbid obesity) 09/01/2012  . Fatigue 09/01/2012  . Preoperative cardiovascular examination 09/01/2012  . Chronic renal insufficiency, stage II (mild) 04/30/2012  . OSA on CPAP 04/29/2012  . Pulmonary hypertension, 37mhg 04/29/2012  . CAD, LAD/CFX DES 04/28/12- staged RCA DES 04/29/12 after pt declined CABG, cath 05/14/13 stable CAD medical therapy   . Occlusion and stenosis of carotid artery without mention of cerebral infarction 11/07/2011  . Diabetes mellitus type 2 with neurological manifestations 04/29/2011  . Essential hypertension 04/29/2011  . Neuropathy 04/29/2011  . 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 Depression 04/29/2011   Past Medical History  Diagnosis Date  . Diabetes mellitus   . Carotid artery occlusion   . Complication of anesthesia   . PONV (postoperative nausea and vomiting)     "[place]"  . Hypothyroidism   . Depression     situaltional   . Neuropathy     notably improved following PCI with improved cardiac function  . CAD S/P percutaneous coronary angioplasty 2003, 04/2012    status post PCI to LAD, circumflex-OM 2, RCA  . 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 Dyslipidemia associated with type 2 diabetes mellitus   .  GERD (gastroesophageal reflux disease)   . Chronic renal insufficiency, stage II (mild)   . Pulmonary hypertension 04/2012    PA pressure 40mhg  . HTN (hypertension)   . Chronic venous insufficiency     varicosities, no reflux; dopplers 04/14/12- valvular insufficiency in the R and L GSV  .  Anginal pain March 2015    Cardiac cath showed patent stents with distal LAD, circumflex-OM and RCA disease in small vessels.  . Obesity   . Sleep apnea     uses nightly  . Neuromuscular disorder     neuropathy in feet   Past Surgical History  Procedure Laterality Date  . Umbilical hernia repair  2009    steel mesh insert  . Coronary angioplasty  12/21/2001    PTCA of the distal and mid AV groove circ, unsuccessful PTCA of second OM total occlusion, unsuccessful PTCA of the apical LAD total occlusion  . Colonscopy    . Carotid endarterectomy  2005    Right; recent carotid Dopplers notes elevated velocities.   . Back surgery  2005 x1    X2-2010  . Lumbar laminectomy/decompression microdiscectomy  01/07/2012    Procedure: LUMBAR LAMINECTOMY/DECOMPRESSION MICRODISCECTOMY 1 LEVEL;  Surgeon: KWinfield Cunas MD;  [facility];  Service: Neurosurgery;  Laterality: Bilateral;   Lumbar Three-Four Decompression  . Coronary stent placement  04/28/12    PCI to LAD (3x2343mXience DES postdilated to 3.25) and circ prox and mid (2 overlappinmg 2.43m73m2mmXience DES postdilated to 2.743m21m. Cardiac catheterization  12/11/2001    significant 3V CAD, normal LV function  . Coronary angioplasty with stent placement  04/29/12    PCI to 3 RCA lesions, Promus Premiere 2.243mm24m distally, mid was 2.43mx2859mand proximally 2.75x16mm, 27m5-60%  . Doppler echocardiography  04/28/2012    poor quality study: EF estimated 60-65%; unable to assess diastolic function (previously noted to have diastolic dysfunction); severely dilated left atrium and mild right atrium; dilated IVC consistent with increased central venous pressure..  . Carotid doppler  04/27/2012    right internal carotid: Elevated velocities but no evidence of plaque. Left internal carotid 40-59%  . Abis  04/27/2012    mild bilateral arterial insufficiency  . Cardiac catheterization  March 2015    Moderate Pulm HTN: 46/16 - mean 33 mmHg; PCWP  22mmHg;18mltivessel CAD with widely patent mid LAD stents and 90% apical LAD, widely patent proximal and mid cervical extends with previous subtotal occlusion of smaller tube, I be patent RCA ostial mid and distal stents with 70% distal continuation RCA disease  . Bunionectomy Right 11/11/2013    Procedure: RIGHT FOOT SILVER BUNIONECTOMY;  Surgeon: John HewWylene Simmerocation: [facility];  Laterality: Right;  . Abdominal aortagram N/A 11/15/2011    Procedure: ABDOMINAL AORTAGRAM;  Surgeon: Charles Elam Dutchocation: [facility];  Service: Cardiovascular;  [name];  . Left and right heart catheterization with coronary angiogram N/A 04/27/2012    Procedure: LEFT AND RIGHT HEART CATHETERIZATION WITH CORONARY ANGIOGRAM;  Surgeon: David W Leonie Manocation: [facility];  Service: Cardiovascular;  [name];  . Percutaneous coronary stent intervention (pci-s) N/A 04/28/2012    Procedure: PERCUTANEOUS CORONARY STENT INTERVENTION (PCI-S);  Surgeon: Thomas ATroy Sineocation: [facility];  Service: Cardiovascular;  [name];  . Percutaneous coronary stent intervention (pci-s) N/A 04/29/2012    Procedure: PERCUTANEOUS CORONARY STENT INTERVENTION (PCI-S);  Surgeon: David W Leonie Man  MD;  Location: [facility] CATH LAB;  Service: Cardiovascular;  [name];  . Left and right heart catheterization with coronary angiogram N/A 05/14/2013    Procedure: LEFT AND RIGHT HEART CATHETERIZATION WITH CORONARY ANGIOGRAM;  Surgeon: Troy Sine, MD;  Location: [facility] CATH LAB;  Service: Cardiovascular;  [name];   Allergies  Allergen Reactions  . Septra [Bactrim] Itching  . Penicillins Hives    Allergy to All cillin drugs  Ancef given 9/24 with no obvious reaction  . Clopidogrel     NON RESPONDER  P2Y12  . Other     Walnuts, pecans    Prior to Admission medications   Medication Sig Start Date End Date Taking? Authorizing Provider  carvedilol  (COREG) 6.25 MG tablet TAKE 1 TABLET (6.25 MG TOTAL) BY MOUTH 2 (TWO) TIMES DAILY. 03/04/14   Leonie Man, MD  cholecalciferol (VITAMIN D) 1000 UNITS tablet Take 1,000 Units by mouth 4 (four) times daily.     Historical Provider, MD  Choline Fenofibrate (FENOFIBRIC ACID) 135 MG CPDR Take 1 capsule by mouth daily. 01/06/14   Historical Provider, MD  escitalopram (LEXAPRO) 20 MG tablet Take 1 tablet (20 mg total) by mouth daily. 02/16/14   Merlinda Frederick McVeigh, PA  esomeprazole (NEXIUM) 40 MG capsule TAKE 40 MG BY MOUTH DAILY BEFORE BREAKFAST. 04/07/14   Leonie Man, MD  felodipine (PLENDIL) 10 MG 24 hr tablet TAKE 1 TABLET (10 MG TOTAL) BY MOUTH DAILY. 03/04/14   Leonie Man, MD  fish oil-omega-3 fatty acids 1000 MG capsule Take 1,200 mg by mouth daily.     Historical Provider, MD  furosemide (LASIX) 40 MG tablet Take 1 tablet (40 mg total) by mouth daily. Patient taking differently: Take 20 mg by mouth 2 (two) times daily.  03/30/14   Leonie Man, MD  Garlic 276 MG CAPS Take 500 mg by mouth daily.    Historical Provider, MD  [name] KWIKPEN 100 UNIT/ML Mayer Masker  01/04/14   Historical Provider, MD  insulin aspart (NOVOLOG) 100 UNIT/ML injection Inject 50 Units into the skin 2 (two) times daily.  04/29/11   [name], MD  insulin NPH (HUMULIN N,NOVOLIN N) 100 UNIT/ML injection Inject 75 Units into the skin 2 (two) times daily before a meal.     Historical Provider, MD  Iodine, Kelp, 0.15 MG TABS Take by mouth daily.    Historical Provider, MD  levothyroxine (SYNTHROID, LEVOTHROID) 88 MCG tablet Take 88 mcg by mouth daily before breakfast.    Historical Provider, MD  lisinopril (PRINIVIL,ZESTRIL) 20 MG tablet Take 0.5 tablets (10 mg total) by mouth daily. 03/04/14   Leonie Man, MD  metFORMIN (GLUCOPHAGE) 1000 MG tablet Take 1 tablet (1,000 mg total) by mouth 2 (two) times daily with a meal. Patient taking differently: Take 1,000 mg by mouth at bedtime.  05/17/13   Isaiah Serge, NP  Misc  Natural Products (LUTEIN 20 PO) Take 20 mg by mouth daily.    Historical Provider, MD  Multiple Vitamins-Minerals (MULTIVITAMIN WITH MINERALS) tablet Take 1 tablet by mouth daily.    Historical Provider, MD  NITROSTAT 0.4 MG SL tablet PLACE 1 TAB UNDER THE TONGUE EVERY 5 MINUTES AS NEEDED FOR CHEST PAIN (SEVERE PRESSURE OR TIGHTNESS) 03/24/14   Leonie Man, MD  NOVOLOG FLEXPEN 100 UNIT/ML FlexPen  01/04/14   Historical Provider, MD  ticagrelor (BRILINTA) 90 MG TABS tablet TAKE 1 TABLET (90 MG TOTAL) BY MOUTH 2 (TWO) TIMES DAILY. 03/04/14  Leonie Man, MD  TURMERIC PO Take 800 mg by mouth daily.    Historical Provider, MD  vitamin C (ASCORBIC ACID) 500 MG tablet Take 1,000 mg by mouth daily.    Historical Provider, MD   History   Social History  . Marital Status: Single    Spouse Name: N/A  . Number of Children: 0  . Years of Education: hs   Occupational History  . [employer]   .     Social History Main Topics  . Smoking status: Former Smoker -- 1.00 packs/day for 42 years    Types: Cigarettes    Quit date: 03/23/2003  . Smokeless tobacco: Never Used  . Alcohol Use: No  . Drug Use: No  . Sexual Activity: Not on file   Other Topics Concern  . Not on file   Social History Narrative   He and his partner [name] have now officially gotten married on 05/26/2013.  [name] is also a patient of our clinic.   He works at [employer].   He does not smoke, he quit in 2009.  He does not drink alcohol.   He was adopted and no family history is known.       Review of Systems  Neurological: Positive for numbness.       Objective:   Physical Exam  CONSTITUTIONAL: Well developed/well nourished HEAD: Normocephalic/atraumatic EYES: EOMI/PERRL; puffiness around both eyes ENMT: Mucous membranes moist NECK: supple no meningeal signs, right carotid enterectomy scar, no bruit left or right carotid SPINE/BACK:entire spine nontender CV: S1/S2 noted, no  murmurs/rubs/gallops noted LUNGS: Lungs are clear to auscultation bilaterally, no apparent distress ABDOMEN: soft, no rebound or guarding, bowel sounds noted throughout abdomen; protuberant, there is very mild left upper abdominal discomfort  GU:no cva tenderness NEURO: Pt is awake/alert/appropriate, moves all extremitiesx4.  No facial droop.   EXTREMITIES: pulses normal/equal, full ROM; 1+ edema in upper extremities SKIN: warm, color normal PSYCH: no abnormalities of mood noted, alert and oriented to situation  Filed Vitals:   05/04/14 0904  BP: 144/84  Pulse: 79  Temp: 98 F (36.7 C)  TempSrc: Oral  Resp: 17  Height: 6' 1.5" (1.867 m)  Weight: 328 lb (148.78 kg)  SpO2: 97%   Results for orders placed or performed in visit on 05/04/14  POCT CBC  Result Value Ref Range   WBC 6.9 4.6 - 10.2 K/uL   Lymph, poc 2.0 0.6 - 3.4   POC LYMPH PERCENT 28.4 10 - 50 %L   MID (cbc) 0.6 0 - 0.9   POC MID % 8.2 0 - 12 %M   POC Granulocyte 4.4 2 - 6.9   Granulocyte percent 63.4 37 - 80 %G   RBC 4.43 (A) 4.69 - 6.13 M/uL   Hemoglobin 13.4 (A) 14.1 - 18.1 g/dL   HCT, POC 40.5 (A) 43.5 - 53.7 %   MCV 91.4 80 - 97 fL   MCH, POC 30.3 27 - 31.2 pg   MCHC 33.1 31.8 - 35.4 g/dL   RDW, POC 14.0 %   Platelet Count, POC 174 142 - 424 K/uL   MPV 8.1 0 - 99.8 fL   [facility] reading (PRIMARY) by  Dr.Champ Keetch patient has C4-5, C5-6, C6-7 degenerative disc disease with arthritic changes of the facets.      Assessment & Plan:  Patient has what appears to be a pinched nerve in his right arm. We'll schedule an MRI of the neck without contrast. We have been able  to move up his appointment with the cardiologist so he will be seen tomorrow. He is going to call Dr. Soyla Murphy and discuss with her whether he should stop his metformin. Until he is able to talk with her he will stay off of this medication. Patient given pain medication pending his evaluation by MRI. He will also follow-up with Dr. Benson Norway regarding his abdominal  discomfort. I do have some concerns about vascular supply to his bowel because we know he has significant vascular disease.

## 2014-05-04 NOTE — Patient Instructions (Signed)
Dr. Ellyn Hack tomorrow 05/05/14 @ 8 am

## 2014-05-05 ENCOUNTER — Ambulatory Visit (INDEPENDENT_AMBULATORY_CARE_PROVIDER_SITE_OTHER): Payer: PRIVATE HEALTH INSURANCE | Admitting: Cardiology

## 2014-05-05 ENCOUNTER — Encounter: Payer: Self-pay | Admitting: Cardiology

## 2014-05-05 VITALS — BP 134/66 | HR 64 | Ht 73.5 in | Wt 326.7 lb

## 2014-05-05 DIAGNOSIS — I5032 Chronic diastolic (congestive) heart failure: Secondary | ICD-10-CM

## 2014-05-05 DIAGNOSIS — R079 Chest pain, unspecified: Secondary | ICD-10-CM

## 2014-05-05 DIAGNOSIS — R0600 Dyspnea, unspecified: Secondary | ICD-10-CM

## 2014-05-05 DIAGNOSIS — I2510 Atherosclerotic heart disease of native coronary artery without angina pectoris: Secondary | ICD-10-CM

## 2014-05-05 DIAGNOSIS — E1169 Type 2 diabetes mellitus with other specified complication: Secondary | ICD-10-CM

## 2014-05-05 DIAGNOSIS — Z9861 Coronary angioplasty status: Secondary | ICD-10-CM

## 2014-05-05 DIAGNOSIS — I272 Pulmonary hypertension, unspecified: Secondary | ICD-10-CM

## 2014-05-05 DIAGNOSIS — E782 Mixed hyperlipidemia: Secondary | ICD-10-CM

## 2014-05-05 DIAGNOSIS — I270 Primary pulmonary hypertension: Secondary | ICD-10-CM

## 2014-05-05 DIAGNOSIS — I10 Essential (primary) hypertension: Secondary | ICD-10-CM

## 2014-05-05 NOTE — Progress Notes (Signed)
PCP: [name], MD  Clinic Note: Chief Complaint  Patient presents with  . 7 month visit    chest discomfort, sob, diarrhea (thinks it from [place]- had 2 episode  THIS PAST WEEK, [place]  . Shortness of Breath  . Chest Pain  . Coronary Artery Disease    HPI: Harold [name] is a 59 y.o. male with a PMH below who presents today for post ER visit for episode of chest discomfort.  Izac is a well-known patient of mine who actually was supposed to see me back in 6 month followup this winter -- last seen in August 2015.  He has a history of multivessel PCI in March of 2014. He basically opted for multivessel PCI as opposed to CABG. He has had a followup cath last year that showed widely patent stents but with microvascular disease. When I saw him in August he was doing relatively well, but was having to "detox "himself from some of his medications he was on. We had issues with statins and fenofibrate. He had extensive mild his. In addition to do so he also has some chronic pain issues with neuropathy in his legs. He is morbidly obese and just has a hard time keeping his weight off. He has nowactually gained a considerable amount of weight that he had previously lost. He was low at 318 pounds in August -- he actually says this has been over the last few weeks and not several months. He has been watching his diet and trying to exercise and reactive. He does feel that his abdomen is totally gotten bloated.  He really has not had any active cardiac symptoms since August of last year with no significant episodes other than maybe occasionally getting short of breath he watches to do something or is rushing up the steps. He has not had any chest pressure associated with. He is not at PND orthopnea symptoms but does have mild stable edema that is pretty much chronic with stasis and neuropathy. He uses CPAP which really keeps his breathing issues at bay.  He is not having rapid  irregular heartbeat/palpitations, syncope/near syncope or TIA/amaurosis fugax.  The reason why he is here today he is to follow up from several clinic and ER visits after an episode that occurred at work on Saturday. He describes the episode as follows: He has been having issues with loose stools and diarrhea but he thinks is related to a diabetes medication (most likely metformin). He was at work and had significant GI distress in intense urge to defecate.  He was unable to the bathroom in time, and had a significant accident. He did go to the bathroom without being seen and was clearance of as best he possibly could. This caused him basically have a "panic attack".  He described feeling flush and panicky / shaky. He felt closed in warm or flushed. He began to hyperventilate and could not control himself. He finally successfully seen results in now to his car where he proceeded to get himself under control from a breathing standpoint. Once he was able to control he was able to get himself home and his symptoms abated. While he was having a hyperventilation spell he did feel a tightness across his chest that was a squeezing sensation. He didn't describe it as something that felt like his pre-cath into the symptoms. It was more of a closing in sensation across his chest.  He told the urgent care physician that  he gets short of breath walking 50 feet. But he doesn't tell me that it's that significant. The clinic note for daycare suggested that the discomfort in his chest with substernal chest pressure that lasted roughly 5 minutes.  Very stated that his normal pattern of chest pressure with exertion (which he Has not told me).   Interestingly, the emergency room doctor ( he was referred there for higher daily evaluation on the same day - 3/14) there was no suggestion of chest pain or dyspnea. He was also noticing some numbness in his right arm that is thought to be possible radiculopathy. He did go to the ER  where he had repeat a workup to evaluate his diarrhea. Apparently they're still in the process of figuring that out.  Essentially the dissection that one episode, he tells me he really has been doing better. He doesn't complain of exertional dyspnea to me he thinks that the episode was all related to a panic attack from his diarrhea accident. He also thinks it may be because he had a Ross to the bathroom he was breathing harder and was more short of breath. He does not think that this is consistent with his prior angina symptoms.  Past Medical History  Diagnosis Date  . Diabetes mellitus   . Carotid artery occlusion   . Complication of anesthesia   . PONV (postoperative nausea and vomiting)     "[place]"  . Hypothyroidism   . Depression     situaltional   . Neuropathy     notably improved following PCI with improved cardiac function  . CAD S/P percutaneous coronary angioplasty 2003, 04/2012    status post PCI to LAD, circumflex-OM 2, RCA  . 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 Dyslipidemia associated with type 2 diabetes mellitus   . GERD (gastroesophageal reflux disease)   . Chronic renal insufficiency, stage II (mild)   . Pulmonary hypertension 04/2012    PA pressure 66mhg  . HTN (hypertension)   . Chronic venous insufficiency     varicosities, no reflux; dopplers 04/14/12- valvular insufficiency in the R and L GSV  . Anginal pain March 2015    Cardiac cath showed patent stents with distal LAD, circumflex-OM and RCA disease in small vessels.  . Obesity   . Sleep apnea     uses nightly  . Neuromuscular disorder     neuropathy in feet    Prior Cardiac Evaluation and Past Surgical History: Past Surgical History  Procedure Laterality Date  . Umbilical hernia repair  2009    steel mesh insert  . Coronary angioplasty  12/21/2001    PTCA of the distal and mid AV groove circ, unsuccessful  PTCA of second OM total occlusion, unsuccessful PTCA of the apical LAD total occlusion  . Colonscopy    . Carotid endarterectomy  2005    Right; recent carotid Dopplers notes elevated velocities.   . Back surgery  2005 x1    X2-2010  . Lumbar laminectomy/decompression microdiscectomy  01/07/2012    Procedure: LUMBAR LAMINECTOMY/DECOMPRESSION MICRODISCECTOMY 1 LEVEL;  Surgeon: KWinfield Cunas MD;  [facility];  Service: Neurosurgery;  Laterality: Bilateral;   Lumbar Three-Four Decompression  . Coronary stent placement  04/28/12    PCI to LAD (3x24mXience DES postdilated to 3.25) and circ prox and mid (2 overlappinmg 2.85m57m2mmXience DES postdilated to 2.785m63m. Cardiac  catheterization  12/11/2001    significant 3V CAD, normal LV function  . Coronary angioplasty with stent placement  04/29/12    PCI to 3 RCA lesions, Promus Premiere 2.31mx8mm distally, mid was 2.586m8mm and proximally 2.75x1656mEF 55-60%  . Doppler echocardiography  04/28/2012    poor quality study: EF estimated 60-65%; unable to assess diastolic function (previously noted to have diastolic dysfunction); severely dilated left atrium and mild right atrium; dilated IVC consistent with increased central venous pressure..  . Carotid doppler  04/27/2012    right internal carotid: Elevated velocities but no evidence of plaque. Left internal carotid 40-59%  . Abis  04/27/2012    mild bilateral arterial insufficiency  . Cardiac catheterization  March 2015    Moderate Pulm HTN: 46/16 - mean 33 mmHg; PCWP 31m21m; [name], widely patent proximal and mid cervical extends with previous subtotal occlusion of smaller tube, I be patent RCA ostial mid and distal stents with 70% distal continuation RCA disease  . Bunionectomy Right 11/11/2013    Procedure: RIGHT FOOT SILVER BUNIONECTOMY;  Surgeon: JohnWylene Simmer;  [facility];   Laterality: Right;  . Abdominal aortagram N/A 11/15/2011    Procedure: ABDOMINAL AORTAGRAM;  Surgeon: CharElam Dutch;  [facility];  Service: Cardiovascular;  [name];  . Left and right heart catheterization with coronary angiogram N/A 04/27/2012    Procedure: LEFT AND RIGHT HEART CATHETERIZATION WITH CORONARY ANGIOGRAM;  Surgeon: DaviLeonie Man;  [facility];  Service: Cardiovascular;  [name];  . Percutaneous coronary stent intervention (pci-s) N/A 04/28/2012    Procedure: PERCUTANEOUS CORONARY STENT INTERVENTION (PCI-S);  Surgeon: ThomTroy Sine;  [facility];  Service: Cardiovascular;  [name];  . Percutaneous coronary stent intervention (pci-s) N/A 04/29/2012    Procedure: PERCUTANEOUS CORONARY STENT INTERVENTION (PCI-S);  Surgeon: DaviLeonie Man;  [facility];  Service: Cardiovascular;  [name];  . Left and right heart catheterization with coronary angiogram N/A 05/14/2013    Procedure: LEFT AND RIGHT HEART CATHETERIZATION WITH CORONARY ANGIOGRAM;  Surgeon: ThomTroy Sine;  [facility];  Service: Cardiovascular;  [name];    ROS: A comprehensive was performed.  Significant/pertinent positives and negative are noted above in history of present illness. Review of Systems  Constitutional: Negative for weight loss (Actually weight gain as noted above).  HENT: Negative for nosebleeds.   Respiratory: Positive for shortness of breath (See above. He does have exertional dyspnea which is probably multifactorial).   Cardiovascular: Positive for chest pain (See above) and leg swelling (well-controlled). Negative for claudication.  Gastrointestinal: Positive for abdominal pain and diarrhea. Negative for blood in stool and melena.  Genitourinary: Negative for dysuria and hematuria.  Neurological:       Right arm numbness - thought to be related to a pinched nerve Bilateral lower extremity  neuropathy  Endo/Heme/Allergies: Does not bruise/bleed easily.  Psychiatric/Behavioral: The patient is nervous/anxious (See above).   All other systems reviewed and are negative.   Current Outpatient Prescriptions on File Prior to Visit  Medication Sig Dispense Refill  . carvedilol (COREG) 6.25 MG tablet TAKE 1 TABLET (6.25 MG TOTAL) BY MOUTH 2 (TWO) TIMES DAILY. 60 tablet 1  . cholecalciferol (VITAMIN D) 1000 UNITS tablet Take 1,000 Units by mouth 4 (four) times daily.     . Choline Fenofibrate (FENOFIBRIC ACID)  135 MG CPDR Take 1 capsule by mouth daily.  6  . escitalopram (LEXAPRO) 20 MG tablet Take 1 tablet (20 mg total) by mouth daily. 30 tablet 11  . esomeprazole (NEXIUM) 40 MG capsule TAKE 40 MG BY MOUTH DAILY BEFORE BREAKFAST. 90 capsule 1  . felodipine (PLENDIL) 10 MG 24 hr tablet TAKE 1 TABLET (10 MG TOTAL) BY MOUTH DAILY. 30 tablet 1  . fish oil-omega-3 fatty acids 1000 MG capsule Take 1,200 mg by mouth daily.     . furosemide (LASIX) 40 MG tablet Take 1 tablet (40 mg total) by mouth daily. (Patient taking differently: Take 20 mg by mouth 2 (two) times daily. ) 30 tablet 6  . Garlic 829 MG CAPS Take 500 mg by mouth daily.    Marland Kitchen HYDROcodone-acetaminophen (NORCO/VICODIN) 5-325 MG per tablet Take 1 tablet by mouth every 6 (six) hours as needed for moderate pain. 20 tablet 0  . insulin aspart (NOVOLOG) 100 UNIT/ML injection Inject 75 Units into the skin 2 (two) times daily. IN ADDITION 25 MG ONCE ADAY    . insulin NPH (HUMULIN N,NOVOLIN N) 100 UNIT/ML injection Inject 75 Units into the skin 2 (two) times daily before a meal. IN ADDITION 50 MG ONCE ADAY    . Iodine, Kelp, 0.15 MG TABS Take by mouth daily.    Marland Kitchen levothyroxine (SYNTHROID, LEVOTHROID) 88 MCG tablet Take 88 mcg by mouth daily before breakfast.    . lisinopril (PRINIVIL,ZESTRIL) 20 MG tablet Take 0.5 tablets (10 mg total) by mouth daily. 15 tablet 1  . metFORMIN (GLUCOPHAGE) 1000 MG tablet Take 1 tablet (1,000 mg total) by mouth  2 (two) times daily with a meal.    . Misc Natural Products (LUTEIN 20 PO) Take 20 mg by mouth daily.    . Multiple Vitamins-Minerals (MULTIVITAMIN WITH MINERALS) tablet Take 1 tablet by mouth daily.    Marland Kitchen NITROSTAT 0.4 MG SL tablet PLACE 1 TAB UNDER THE TONGUE EVERY 5 MINUTES AS NEEDED FOR CHEST PAIN (SEVERE PRESSURE OR TIGHTNESS) 25 tablet 3  . ticagrelor (BRILINTA) 90 MG TABS tablet TAKE 1 TABLET (90 MG TOTAL) BY MOUTH 2 (TWO) TIMES DAILY. 60 tablet 1  . TURMERIC PO Take 800 mg by mouth daily.    . vitamin C (ASCORBIC ACID) 500 MG tablet Take 1,000 mg by mouth daily.    . [DISCONTINUED] insulin lispro (HUMALOG) 100 UNIT/ML injection Inject 25 Units into the skin 3 (three) times daily before meals.     No current facility-administered medications on file prior to visit.   Allergies  Allergen Reactions  . Septra [Bactrim] Itching  . Penicillins Hives    Allergy to All cillin drugs  Ancef given 9/24 with no obvious reaction  . Clopidogrel     NON RESPONDER  P2Y12  . Other     Walnuts, pecans     History  Substance Use Topics  . Smoking status: Former Smoker -- 1.00 packs/day for 42 years    Types: Cigarettes    Quit date: 03/23/2003  . Smokeless tobacco: Never Used  . Alcohol Use: No   History   Social History Narrative   He and his partner [name] have now officially gotten married on 05/26/2013.  [name] is also a patient of our clinic.  He is now initially hyphenated his last name.   He works at [employer].   He does not smoke, he quit in 2009.  He does not drink alcohol.   He was adopted and no  family history is known.     Family History  Problem Relation Age of Onset  . Adopted: Yes  . Family history unknown: Yes    Wt Readings from Last 3 Encounters:  05/05/14 326 lb 11.2 oz (148.19 kg)  05/04/14 328 lb (148.78 kg)  05/02/14 328 lb (148.78 kg)  in 09/2013 - 318 lb!!  PHYSICAL EXAM BP 134/66 mmHg  Pulse 64  Ht 6' 1.5" (1.867 m)  Wt 326 lb 11.2 oz  (148.19 kg)  BMI 42.51 kg/m2 General appearance: alert, cooperative, appears stated age, no distress and borderline morbidly obese -- Wgt gain noted - increased abdominal girth. Neck: no adenopathy, no carotid bruit and no JVD  Lungs: clear to auscultation bilaterally, normal percussion bilaterally and non-labored  Heart: regular rate and rhythm, S1, S2 normal, no murmur, click, rub or gallop; nondisplaced PMI. Occasional ectopic beats.  Abdomen: soft, non-tender; bowel sounds normal; no masses, no organomegaly; rotund, obese abdomen. There is tenderness to the right lower quadrant as well as the umbilical region.  Extremities: extremities normal, atraumatic, no cyanosis, and trace to 1+ lower extremity pitting edema  Pulses: 2+ and symmetric;  Neurologic: Mental status: Alert, oriented, thought content appropriate; Cranial nerves: normal (II-XII grossly intact   Adult ECG Report  Rate: 85 ;  Rhythm: normal sinus rhythm  Narrative Interpretation: normal EKG  Recent Labs:    Lab Results  Component Value Date   CHOL 262* 05/14/2013   HDL 43 05/14/2013   LDLCALC UNABLE TO CALCULATE IF TRIGLYCERIDE OVER 400 mg/dL 05/14/2013   TRIG 569* 05/14/2013   CHOLHDL 6.1 05/14/2013     Chemistry      Component Value Date/Time   NA 138 05/04/2014 0936   K 4.6 05/04/2014 0936   CL 101 05/04/2014 0936   CO2 28 05/04/2014 0936   BUN 21 05/04/2014 0936   CREATININE 1.24 05/04/2014 0936   CREATININE 1.28 05/02/2014 1945      Component Value Date/Time   CALCIUM 9.5 05/04/2014 0936   ALKPHOS 55 05/02/2014 1945   AST 35 05/02/2014 1945   ALT 47 05/02/2014 1945   BILITOT 0.6 05/02/2014 1945      ASSESSMENT / PLAN: Problem List Items Addressed This Visit    CAD, LAD/CFX DES 04/28/12- staged RCA DES 04/29/12 after pt declined CABG, cath 05/14/13 stable CAD medical therapy - Primary (Chronic)    He is on Brilinta without aspirin. His P2Y12 assay did not show appropriate antiplatelet effect from  Plavix. With this extensive standing work I thought that he needed the improved benefit of Brilinta over Plavix. He remains a stable dose of ACE inhibitor and beta blocker. He is also on calcium channel blocker for blood pressure and additional antianginal effect. He is not having he uses when necessary nitroglycerin per his report.  Plan: Continue current management. Monitor for symptoms of actual anginal complaints.      Chest pain with low risk for cardiac etiology    He is now just about 5 days post panic attack episode. The symptoms he describes to me he clearly sound like they were panic attack. He may have terrible position something different. So we will need to monitor for recurrent symptoms. He does multivessel CAD with multiple stents. They were all patent last year, but he definitely probably has some diastolic dysfunction related angina he is overexerting himself. I think with the hyperventilation he may very well had some chest discomfort. Even two-dimensional in nature but nothing that I think  we should investigate. Myoview stress test not been very accurate on the past. He is not convinced that this is a significant episode from a cardiac standpoint. Therefore we will just continue to monitor and reassess.      Chronic diastolic heart failure, NYHA class 2 - PCWPf 22 mmHg during catheterization. (Chronic)    On stable afterload reduction with ACE inhibitor as well as calcium channel blocker beta blocker. He was not able tolerate the increased dose of ACE inhibitor in the past. Continue on 10 mg. He is on a stable dose of furosemide which he takes based on the sliding scale. Unfortunately with his recent weight gain this is becoming harder for her to figure out. He is not having PND orthopnea which would suggest that it's probably not LV failure related weight gain.      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 He has multiple intolerances to medications we tried several statins and fenofibrate. After his last episode I am really reluctant to restart any statin or fenofibrate. I will refer to our clinical pharmacists.      Dyspnea   Essential hypertension    Actually stable now on carvedilol having switched from atenolol. On lisinopril which we had a back down on. He is also on felodipine  Which does help as an antianginal agent as well as blood pressure control agent. Next dose to titrate up would be ACE inhibitor.      Pulmonary hypertension, 30mhg (Chronic)    Combined obesity,OHS, COPD and OSA plus secondary effect from elevated LVEDP related pulmonary venous congestion. Continue CPAP. He is having a difficult time with weight loss simply because his weight precludes him from exercising because of joint pains. This undoubtedly at least in his exertional dyspnea.   plan:continue afterload reduction and stable diuretic dose. Continue CPAP and weight loss efforts.         No orders of the defined types were placed in this encounter.   No orders of the defined types were placed in this encounter.   GTrimaineis a very complicated patient with multiple comorbidities listed above. He had a very traumatic experience that he had explained to me in order to help himself cope with it. I spent close to 45 minutes discussing this episode with him and his family his only issue and what he felt needed to be done. After talking to the episode he was reassured with his own believed that this was a panic attack. He discounted his statements that he may to other physicians about chest pain symptoms besides that one episode. He thinks he just was still somewhat upset. Over half the time spent with patient was in counseling and reassurance. Although the clinical decision-making during this visit was not great, the interpersonal  interactions and reassurance was the main focus of the visit.  Total time the patient was about 45 minutes. In documentation at least another 20 minutes.  Followup: 6 months    Lalaine Overstreet, DLeonie Green M.D., M.S. Interventional Cardiologist   Pager # [phone]

## 2014-05-05 NOTE — Patient Instructions (Addendum)
Your physician wants you to follow-up in: 6 Months Dr Ellyn Hack.  You will receive a reminder letter in the mail two months in advance. If you don't receive a letter, please call our office to schedule the follow-up appointment.

## 2014-05-08 ENCOUNTER — Encounter: Payer: Self-pay | Admitting: Cardiology

## 2014-05-08 DIAGNOSIS — R0789 Other chest pain: Secondary | ICD-10-CM | POA: Insufficient documentation

## 2014-05-08 NOTE — Assessment & Plan Note (Signed)
Combined obesity,OHS, COPD and OSA plus secondary effect from elevated LVEDP related pulmonary venous congestion. Continue CPAP. He is having a difficult time with weight loss simply because his weight precludes him from exercising because of joint pains. This undoubtedly at least in his exertional dyspnea.   plan:continue afterload reduction and stable diuretic dose. Continue CPAP and weight loss efforts.

## 2014-05-08 NOTE — Assessment & Plan Note (Signed)
I have referred him to the lipid clinic, but apparently just stopped happening after the pharmacist left. He has multiple intolerances to medications we tried several statins and fenofibrate. After his last episode I am really reluctant to restart any statin or fenofibrate. I will refer to our clinical pharmacists.

## 2014-05-08 NOTE — Assessment & Plan Note (Signed)
On stable afterload reduction with ACE inhibitor as well as calcium channel blocker beta blocker. He was not able tolerate the increased dose of ACE inhibitor in the past. Continue on 10 mg. He is on a stable dose of furosemide which he takes based on the sliding scale. Unfortunately with his recent weight gain this is becoming harder for her to figure out. He is not having PND orthopnea which would suggest that it's probably not LV failure related weight gain.

## 2014-05-08 NOTE — Assessment & Plan Note (Signed)
Actually stable now on carvedilol having switched from atenolol. On lisinopril which we had a back down on. He is also on felodipine  Which does help as an antianginal agent as well as blood pressure control agent. Next dose to titrate up would be ACE inhibitor.

## 2014-05-08 NOTE — Assessment & Plan Note (Signed)
He is on Brilinta without aspirin. His P2Y12 assay did not show appropriate antiplatelet effect from Plavix. With this extensive standing work I thought that he needed the improved benefit of Brilinta over Plavix. He remains a stable dose of ACE inhibitor and beta blocker. He is also on calcium channel blocker for blood pressure and additional antianginal effect. He is not having he uses when necessary nitroglycerin per his report.  Plan: Continue current management. Monitor for symptoms of actual anginal complaints.

## 2014-05-08 NOTE — Assessment & Plan Note (Signed)
He is now just about 5 days post panic attack episode. The symptoms he describes to me he clearly sound like they were panic attack. He may have terrible position something different. So we will need to monitor for recurrent symptoms. He does multivessel CAD with multiple stents. They were all patent last year, but he definitely probably has some diastolic dysfunction related angina he is overexerting himself. I think with the hyperventilation he may very well had some chest discomfort. Even two-dimensional in nature but nothing that I think we should investigate. Myoview stress test not been very accurate on the past. He is not convinced that this is a significant episode from a cardiac standpoint. Therefore we will just continue to monitor and reassess.

## 2014-05-13 ENCOUNTER — Telehealth: Payer: Self-pay

## 2014-05-13 NOTE — Telephone Encounter (Signed)
Pt dropped off FMLA ppw on 3/24 for completion in 5-7 business days. Pt saw [name], MD at 05/04/2014 8:51 AM  ForNumbness and tingling in right hand , and was put out of work from 3/12- 3/16. I filled out a portion of the paperwork based on OV notes, and will place in Dr. Perfecto Kingdom box for review and signature. Blank copies are scanned into patient chart. Please return to Disability department at 102 check out upon completion. Will scan into chart and contact patient.

## 2014-05-19 NOTE — Telephone Encounter (Signed)
LMOM notifying patient ppw is ready for pick up at [facility]

## 2014-05-24 ENCOUNTER — Telehealth: Payer: Self-pay

## 2014-05-24 NOTE — Telephone Encounter (Signed)
Harold [name] at [place] requesting our office to contact [facility] at [place] at [phone] option #1 EXT [phone], Ref # [id number]. Patient is scheduled with them on Thursday 05/26/14 for a MRI Cervical Spine W/O contrast. Per Harold [name] she tried to obtain authorization but it is requesting more clinical information. Harold [name] at [place] call back number is [phone]

## 2014-05-26 ENCOUNTER — Ambulatory Visit: Payer: PRIVATE HEALTH INSURANCE | Admitting: Cardiology

## 2014-05-26 ENCOUNTER — Inpatient Hospital Stay: Admission: RE | Admit: 2014-05-26 | Payer: No Typology Code available for payment source | Source: Ambulatory Visit

## 2014-05-26 NOTE — Telephone Encounter (Signed)
Spoke with [place], pt cancelled appt for MRI. FYI

## 2014-05-27 DIAGNOSIS — Z0271 Encounter for disability determination: Secondary | ICD-10-CM

## 2014-05-29 ENCOUNTER — Telehealth: Payer: Self-pay | Admitting: Emergency Medicine

## 2014-05-29 NOTE — Telephone Encounter (Addendum)
Patient had FMLA forms filled out in March. Patient brought them back in to office on 05/26/2014 and stated that they were incorrectly completed. I have made changes to patient's FMLA based on previous paperwork that he has had completed at [facility] (had to pull patient's paper chart to do this). Dr. Everlene Farrier, can you please sign the updated forms please?  Thanks, [employer]

## 2014-06-16 ENCOUNTER — Telehealth: Payer: Self-pay | Admitting: Cardiology

## 2014-06-16 NOTE — Telephone Encounter (Signed)
Pt called in wanting to clarify his directions for his Furosemide prescription. He states that the prescription is written for 1 tab po once a day he thought Dr. Ellyn Hack told him to take this twice a day. Please f/u with him  Thanks

## 2014-06-16 NOTE — Telephone Encounter (Signed)
Voicemail has not been set up yet

## 2014-06-29 ENCOUNTER — Encounter (HOSPITAL_BASED_OUTPATIENT_CLINIC_OR_DEPARTMENT_OTHER): Payer: Self-pay | Admitting: *Deleted

## 2014-06-29 ENCOUNTER — Emergency Department (HOSPITAL_BASED_OUTPATIENT_CLINIC_OR_DEPARTMENT_OTHER)
Admission: EM | Admit: 2014-06-29 | Discharge: 2014-06-29 | Disposition: A | Discharge status: Home / Self Care | Payer: No Typology Code available for payment source | Attending: Emergency Medicine | Admitting: Emergency Medicine

## 2014-06-29 ENCOUNTER — Ambulatory Visit (INDEPENDENT_AMBULATORY_CARE_PROVIDER_SITE_OTHER): Payer: PRIVATE HEALTH INSURANCE | Admitting: Emergency Medicine

## 2014-06-29 ENCOUNTER — Ambulatory Visit (INDEPENDENT_AMBULATORY_CARE_PROVIDER_SITE_OTHER): Payer: PRIVATE HEALTH INSURANCE

## 2014-06-29 ENCOUNTER — Emergency Department (HOSPITAL_BASED_OUTPATIENT_CLINIC_OR_DEPARTMENT_OTHER): Payer: No Typology Code available for payment source

## 2014-06-29 VITALS — BP 140/70 | HR 81 | Temp 97.4°F | Resp 16 | Ht 74.0 in | Wt 326.0 lb

## 2014-06-29 DIAGNOSIS — Z9889 Other specified postprocedural states: Secondary | ICD-10-CM | POA: Insufficient documentation

## 2014-06-29 DIAGNOSIS — Z79899 Other long term (current) drug therapy: Secondary | ICD-10-CM | POA: Diagnosis not present

## 2014-06-29 DIAGNOSIS — R1010 Upper abdominal pain, unspecified: Secondary | ICD-10-CM

## 2014-06-29 DIAGNOSIS — G8929 Other chronic pain: Secondary | ICD-10-CM

## 2014-06-29 DIAGNOSIS — F329 Major depressive disorder, single episode, unspecified: Secondary | ICD-10-CM | POA: Insufficient documentation

## 2014-06-29 DIAGNOSIS — Z8669 Personal history of other diseases of the nervous system and sense organs: Secondary | ICD-10-CM | POA: Insufficient documentation

## 2014-06-29 DIAGNOSIS — R112 Nausea with vomiting, unspecified: Secondary | ICD-10-CM

## 2014-06-29 DIAGNOSIS — Z87891 Personal history of nicotine dependence: Secondary | ICD-10-CM | POA: Insufficient documentation

## 2014-06-29 DIAGNOSIS — Z9861 Coronary angioplasty status: Secondary | ICD-10-CM | POA: Insufficient documentation

## 2014-06-29 DIAGNOSIS — I129 Hypertensive chronic kidney disease with stage 1 through stage 4 chronic kidney disease, or unspecified chronic kidney disease: Secondary | ICD-10-CM | POA: Insufficient documentation

## 2014-06-29 DIAGNOSIS — Z794 Long term (current) use of insulin: Secondary | ICD-10-CM | POA: Insufficient documentation

## 2014-06-29 DIAGNOSIS — Z880 Allergy status to penicillin: Secondary | ICD-10-CM | POA: Diagnosis not present

## 2014-06-29 DIAGNOSIS — R197 Diarrhea, unspecified: Secondary | ICD-10-CM

## 2014-06-29 DIAGNOSIS — E039 Hypothyroidism, unspecified: Secondary | ICD-10-CM | POA: Diagnosis not present

## 2014-06-29 DIAGNOSIS — R1011 Right upper quadrant pain: Principal | ICD-10-CM

## 2014-06-29 DIAGNOSIS — I2510 Atherosclerotic heart disease of native coronary artery without angina pectoris: Secondary | ICD-10-CM | POA: Diagnosis not present

## 2014-06-29 DIAGNOSIS — N182 Chronic kidney disease, stage 2 (mild): Secondary | ICD-10-CM | POA: Diagnosis not present

## 2014-06-29 DIAGNOSIS — K529 Noninfective gastroenteritis and colitis, unspecified: Secondary | ICD-10-CM | POA: Diagnosis not present

## 2014-06-29 DIAGNOSIS — E118 Type 2 diabetes mellitus with unspecified complications: Secondary | ICD-10-CM

## 2014-06-29 DIAGNOSIS — K219 Gastro-esophageal reflux disease without esophagitis: Secondary | ICD-10-CM | POA: Insufficient documentation

## 2014-06-29 DIAGNOSIS — E119 Type 2 diabetes mellitus without complications: Secondary | ICD-10-CM | POA: Diagnosis not present

## 2014-06-29 DIAGNOSIS — E139 Other specified diabetes mellitus without complications: Secondary | ICD-10-CM | POA: Diagnosis not present

## 2014-06-29 DIAGNOSIS — R1110 Vomiting, unspecified: Secondary | ICD-10-CM | POA: Diagnosis present

## 2014-06-29 LAB — COMPLETE METABOLIC PANEL WITH GFR
ALK PHOS: 54 U/L (ref 39–117)
ALT: 61 U/L — AB (ref 0–53)
AST: 63 U/L — ABNORMAL HIGH (ref 0–37)
Albumin: 4 g/dL (ref 3.5–5.2)
BUN: 25 mg/dL — AB (ref 6–23)
CALCIUM: 8.8 mg/dL (ref 8.4–10.5)
CHLORIDE: 98 meq/L (ref 96–112)
CO2: 28 mEq/L (ref 19–32)
Creat: 1.28 mg/dL (ref 0.50–1.35)
GFR, EST NON AFRICAN AMERICAN: 60 mL/min
GFR, Est African American: 70 mL/min
Glucose, Bld: 296 mg/dL — ABNORMAL HIGH (ref 70–99)
Potassium: 4.2 mEq/L (ref 3.5–5.3)
SODIUM: 137 meq/L (ref 135–145)
Total Bilirubin: 0.7 mg/dL (ref 0.2–1.2)
Total Protein: 6.4 g/dL (ref 6.0–8.3)

## 2014-06-29 LAB — GLUCOSE, POCT (MANUAL RESULT ENTRY): POC GLUCOSE: 303 mg/dL — AB (ref 70–99)

## 2014-06-29 LAB — POCT CBC
Granulocyte percent: 61.8 %G (ref 37–80)
HCT, POC: 36.7 % — AB (ref 43.5–53.7)
HEMOGLOBIN: 12.8 g/dL — AB (ref 14.1–18.1)
LYMPH, POC: 1.2 (ref 0.6–3.4)
MCH: 30.6 pg (ref 27–31.2)
MCHC: 35 g/dL (ref 31.8–35.4)
MCV: 87.5 fL (ref 80–97)
MID (cbc): 0.4 (ref 0–0.9)
MPV: 7.8 fL (ref 0–99.8)
PLATELET COUNT, POC: 149 10*3/uL (ref 142–424)
POC Granulocyte: 2.5 (ref 2–6.9)
POC LYMPH PERCENT: 28.2 %L (ref 10–50)
POC MID %: 10 %M (ref 0–12)
RBC: 4.2 M/uL — AB (ref 4.69–6.13)
RDW, POC: 14.6 %
WBC: 4.1 10*3/uL — AB (ref 4.6–10.2)

## 2014-06-29 LAB — HEPATIC FUNCTION PANEL
ALT: 72 U/L — ABNORMAL HIGH (ref 17–63)
AST: 80 U/L — ABNORMAL HIGH (ref 15–41)
Albumin: 3.8 g/dL (ref 3.5–5.0)
Alkaline Phosphatase: 54 U/L (ref 38–126)
BILIRUBIN TOTAL: 0.9 mg/dL (ref 0.3–1.2)
Bilirubin, Direct: 0.2 mg/dL (ref 0.1–0.5)
Indirect Bilirubin: 0.7 mg/dL (ref 0.3–0.9)
TOTAL PROTEIN: 6.7 g/dL (ref 6.5–8.1)

## 2014-06-29 LAB — POCT UA - MICROSCOPIC ONLY
BACTERIA, U MICROSCOPIC: NEGATIVE
CASTS, UR, LPF, POC: NEGATIVE
CRYSTALS, UR, HPF, POC: NEGATIVE
Mucus, UA: NEGATIVE
Yeast, UA: NEGATIVE

## 2014-06-29 LAB — POCT URINALYSIS DIPSTICK
Blood, UA: NEGATIVE
Glucose, UA: >=1000
KETONES UA: 15
LEUKOCYTES UA: NEGATIVE
Nitrite, UA: NEGATIVE
SPEC GRAV UA: 1.025
Urobilinogen, UA: 1
pH, UA: 5.5

## 2014-06-29 LAB — BASIC METABOLIC PANEL
Anion gap: 11 (ref 5–15)
BUN: 28 mg/dL — ABNORMAL HIGH (ref 6–20)
CALCIUM: 8.7 mg/dL — AB (ref 8.9–10.3)
CHLORIDE: 98 mmol/L — AB (ref 101–111)
CO2: 28 mmol/L (ref 22–32)
Creatinine, Ser: 1.38 mg/dL — ABNORMAL HIGH (ref 0.61–1.24)
GFR calc Af Amer: >60 mL/min (ref >60)
GFR calc non Af Amer: 54 mL/min — ABNORMAL LOW (ref >60)
GLUCOSE: 328 mg/dL — AB (ref 70–99)
Potassium: 4.4 mmol/L (ref 3.5–5.1)
SODIUM: 137 mmol/L (ref 135–145)

## 2014-06-29 LAB — TROPONIN I

## 2014-06-29 LAB — LIPASE, BLOOD: Lipase: 26 U/L (ref 22–51)

## 2014-06-29 LAB — LIPASE: LIPASE: 12 U/L (ref 0–75)

## 2014-06-29 MED ORDER — ONDANSETRON 4 MG PO TBDP: 4.0000 mg | ORAL_TABLET | ORAL | Status: DC | PRN

## 2014-06-29 NOTE — Discharge Instructions (Signed)
PROBABLE Viral Gastroenteritis Viral gastroenteritis is also known as stomach flu. This condition affects the stomach and intestinal tract. It can cause sudden diarrhea and vomiting. The illness typically lasts 3 to 8 days. Most people develop an immune response that eventually gets rid of the virus. While this natural response develops, the virus can make you quite ill. CAUSES  Many different viruses can cause gastroenteritis, such as rotavirus or noroviruses. You can catch one of these viruses by consuming contaminated food or water. You may also catch a virus by sharing utensils or other personal items with an infected person or by touching a contaminated surface. SYMPTOMS  The most common symptoms are diarrhea and vomiting. These problems can cause a severe loss of body fluids (dehydration) and a body salt (electrolyte) imbalance. Other symptoms may include:  Fever.  Headache.  Fatigue.  Abdominal pain. DIAGNOSIS  Your caregiver can usually diagnose viral gastroenteritis based on your symptoms and a physical exam. A stool sample may also be taken to test for the presence of viruses or other infections. TREATMENT  This illness typically goes away on its own. Treatments are aimed at rehydration. The most serious cases of viral gastroenteritis involve vomiting so severely that you are not able to keep fluids down. In these cases, fluids must be given through an intravenous line (IV). HOME CARE INSTRUCTIONS   Drink enough fluids to keep your urine clear or pale yellow. Drink small amounts of fluids frequently and increase the amounts as tolerated.  Ask your caregiver for specific rehydration instructions.  Avoid:  Foods high in sugar.  Alcohol.  Carbonated drinks.  Tobacco.  Juice.  Caffeine drinks.  Extremely hot or cold fluids.  Fatty, greasy foods.  Too much intake of anything at one time.  Dairy products until 24 to 48 hours after diarrhea stops.  You may consume  probiotics. Probiotics are active cultures of beneficial bacteria. They may lessen the amount and number of diarrheal stools in adults. Probiotics can be found in yogurt with active cultures and in supplements.  Wash your hands well to avoid spreading the virus.  Only take over-the-counter or prescription medicines for pain, discomfort, or fever as directed by your caregiver. Do not give aspirin to children. Antidiarrheal medicines are not recommended.  Ask your caregiver if you should continue to take your regular prescribed and over-the-counter medicines.  Keep all follow-up appointments as directed by your caregiver. SEEK IMMEDIATE MEDICAL CARE IF:   You are unable to keep fluids down.  You do not urinate at least once every 6 to 8 hours.  You develop shortness of breath.  You notice blood in your stool or vomit. This may look like coffee grounds.  You have abdominal pain that increases or is concentrated in one small area (localized).  You have persistent vomiting or diarrhea.  You have a fever.  The patient is a child younger than 3 months, and he or she has a fever.  The patient is a child older than 3 months, and he or she has a fever and persistent symptoms.  The patient is a child older than 3 months, and he or she has a fever and symptoms suddenly get worse.  The patient is a baby, and he or she has no tears when crying. MAKE SURE YOU:   Understand these instructions.  Will watch your condition.  Will get help right away if you are not doing well or get worse. Document Released: 02/04/2005 Document Revised: 04/29/2011 Document Reviewed:  11/21/2010 ExitCare Patient Information 2015 [place], [place]. This information is not intended to replace advice given to you by your health care provider. Make sure you discuss any questions you have with your health care provider.

## 2014-06-29 NOTE — ED Notes (Signed)
Went to obtain EKG, Patient gone to radiology.

## 2014-06-29 NOTE — ED Provider Notes (Signed)
CSN: [id number]     Arrival date & time 06/29/14  1001 History   First MD Initiated Contact with Patient 06/29/14 1113     Chief Complaint  Patient presents with  . Emesis     (Consider location/radiation/quality/duration/timing/severity/associated sxs/prior Treatment) HPI The patient has had vomiting and diarrhea for 5 days now. He reports having up to 7-9 episodes of diarrhea per day. As well he is having vomiting and unable to take things orally. He states he has been having some right upper quadrant pain that had been coming and going for several weeks. He states it will go away completely and he felt fine however since this illness now he has developed more ongoing and constant right upper quadrant pain. He saw his family doctor today, they checked some labs and referred him to the emergency department for further evaluation. Patient denies he's had chest pain or shortness of breath. He denies any fever association with this. He denies pain or burning with urination. He states that his blood sugars have been running in the 300s without taking his insulin since he's been sick. Past Medical History  Diagnosis Date  . Diabetes mellitus   . Carotid artery occlusion   . Complication of anesthesia   . PONV (postoperative nausea and vomiting)     "[place]"  . Hypothyroidism   . Depression     situaltional   . Neuropathy     notably improved following PCI with improved cardiac function  . CAD S/P percutaneous coronary angioplasty 2003, 04/2012    status post PCI to LAD, circumflex-OM 2, RCA  . 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 Dyslipidemia associated with type 2 diabetes mellitus   . GERD (gastroesophageal reflux disease)   . Chronic renal insufficiency, stage II (mild)   . Pulmonary hypertension 04/2012    PA pressure 86mhg  . HTN (hypertension)   . Chronic venous insufficiency      varicosities, no reflux; dopplers 04/14/12- valvular insufficiency in the R and L GSV  . Anginal pain March 2015    Cardiac cath showed patent stents with distal LAD, circumflex-OM and RCA disease in small vessels.  . Obesity   . Sleep apnea     uses nightly  . Neuromuscular disorder     neuropathy in feet   Past Surgical History  Procedure Laterality Date  . Umbilical hernia repair  2009    steel mesh insert  . Coronary angioplasty  12/21/2001    PTCA of the distal and mid AV groove circ, unsuccessful PTCA of second OM total occlusion, unsuccessful PTCA of the apical LAD total occlusion  . Colonscopy    . Carotid endarterectomy  2005    Right; recent carotid Dopplers notes elevated velocities.   . Back surgery  2005 x1    X2-2010  . Lumbar laminectomy/decompression microdiscectomy  01/07/2012    Procedure: LUMBAR LAMINECTOMY/DECOMPRESSION MICRODISCECTOMY 1 LEVEL;  Surgeon: KWinfield Cunas MD;  [facility];  Service: Neurosurgery;  Laterality: Bilateral;   Lumbar Three-Four Decompression  . Coronary stent placement  04/28/12    PCI to LAD (3x278mXience DES postdilated to 3.25) and circ prox and mid (2 overlappinmg 2.57m69m2mmXience DES postdilated to 2.757m42m. Cardiac catheterization  12/11/2001    significant 3V CAD, normal LV function  . Coronary angioplasty with stent placement  04/29/12    PCI to  3 RCA lesions, Promus Premiere 2.55mx8mm distally, mid was 2.550m8mm and proximally 2.75x1668mEF 55-60%  . Doppler echocardiography  04/28/2012    poor quality study: EF estimated 60-65%; unable to assess diastolic function (previously noted to have diastolic dysfunction); severely dilated left atrium and mild right atrium; dilated IVC consistent with increased central venous pressure..  . Carotid doppler  04/27/2012    right internal carotid: Elevated velocities but no evidence of plaque. Left internal carotid 40-59%  . Abis  04/27/2012    mild bilateral arterial insufficiency   . Cardiac catheterization  March 2015    Moderate Pulm HTN: 46/16 - mean 33 mmHg; PCWP 68m53m; [name], widely patent proximal and mid cervical extends with previous subtotal occlusion of smaller tube, I be patent RCA ostial mid and distal stents with 70% distal continuation RCA disease  . Bunionectomy Right 11/11/2013    Procedure: RIGHT FOOT SILVER BUNIONECTOMY;  Surgeon: JohnWylene Simmer;  [facility];  Laterality: Right;  . Abdominal aortagram N/A 11/15/2011    Procedure: ABDOMINAL AORTAGRAM;  Surgeon: CharElam Dutch;  [facility];  Service: Cardiovascular;  [name];  . Left and right heart catheterization with coronary angiogram N/A 04/27/2012    Procedure: LEFT AND RIGHT HEART CATHETERIZATION WITH CORONARY ANGIOGRAM;  Surgeon: DaviLeonie Man;  [facility];  Service: Cardiovascular;  [name];  . Percutaneous coronary stent intervention (pci-s) N/A 04/28/2012    Procedure: PERCUTANEOUS CORONARY STENT INTERVENTION (PCI-S);  Surgeon: ThomTroy Sine;  [facility];  Service: Cardiovascular;  [name];  . Percutaneous coronary stent intervention (pci-s) N/A 04/29/2012    Procedure: PERCUTANEOUS CORONARY STENT INTERVENTION (PCI-S);  Surgeon: DaviLeonie Man;  [facility];  Service: Cardiovascular;  [name];  . Left and right heart catheterization with coronary angiogram N/A 05/14/2013    Procedure: LEFT AND RIGHT HEART CATHETERIZATION WITH CORONARY ANGIOGRAM;  Surgeon: ThomTroy Sine;  [facility];  Service: Cardiovascular;  [name];   Family History  Problem Relation Age of Onset  . Adopted: Yes  . Family history unknown: Yes   History  Substance Use Topics  . Smoking status: Former Smoker -- 1.00 packs/day for 42 years    Types: Cigarettes    Quit date: 03/23/2003  . Smokeless  tobacco: Never Used  . Alcohol Use: No    Review of Systems 10 Systems reviewed and are negative for acute change except as noted in the HPI.    Allergies  Septra; Penicillins; Clopidogrel; and Other  Home Medications   Prior to Admission medications   Medication Sig Start Date End Date Taking? Authorizing Provider  carvedilol (COREG) 6.25 MG tablet TAKE 1 TABLET (6.25 MG TOTAL) BY MOUTH 2 (TWO) TIMES DAILY. 03/04/14  Yes DaviLeonie Man  cholecalciferol (VITAMIN D) 1000 UNITS tablet Take 1,000 Units by mouth 4 (four) times daily.    Yes Historical Provider, MD  Choline Fenofibrate (FENOFIBRIC ACID) 135 MG CPDR Take 1 capsule by mouth daily. 01/06/14  Yes Historical Provider, MD  escitalopram (LEXAPRO) 20 MG tablet Take 1 tablet (20 mg total) by mouth daily. 02/16/14  Yes Todd McVeigh, PA  esomeprazole (NEXIUM) 40 MG capsule TAKE 40 MG BY MOUTH DAILY BEFORE BREAKFAST. 04/07/14  Yes DaviLeonie Man  felodipine (PLENDIL) 10 MG 24 hr tablet TAKE 1 TABLET (10  MG TOTAL) BY MOUTH DAILY. 03/04/14  Yes Leonie Man, MD  fish oil-omega-3 fatty acids 1000 MG capsule Take 1,200 mg by mouth daily.    Yes Historical Provider, MD  furosemide (LASIX) 40 MG tablet Take 1 tablet (40 mg total) by mouth daily. Patient taking differently: Take 20 mg by mouth 2 (two) times daily.  03/30/14  Yes Leonie Man, MD  Garlic 425 MG CAPS Take 500 mg by mouth daily.   Yes Historical Provider, MD  HYDROcodone-acetaminophen (NORCO/VICODIN) 5-325 MG per tablet Take 1 tablet by mouth every 6 (six) hours as needed for moderate pain. 05/04/14  Yes [name], MD  insulin aspart (NOVOLOG) 100 UNIT/ML injection Inject 75 Units into the skin 2 (two) times daily. IN ADDITION 25 MG ONCE ADAY 04/29/11  Yes [name], MD  levothyroxine (SYNTHROID, LEVOTHROID) 88 MCG tablet Take 88 mcg by mouth daily before breakfast.   Yes Historical Provider, MD  lisinopril (PRINIVIL,ZESTRIL) 20 MG tablet Take 0.5 tablets (10 mg  total) by mouth daily. 03/04/14  Yes Leonie Man, MD  metFORMIN (GLUCOPHAGE) 1000 MG tablet Take 1 tablet (1,000 mg total) by mouth 2 (two) times daily with a meal. 05/17/13  Yes Isaiah Serge, NP  Misc Natural Products (LUTEIN 20 PO) Take 20 mg by mouth daily.   Yes Historical Provider, MD  Multiple Vitamins-Minerals (MULTIVITAMIN WITH MINERALS) tablet Take 1 tablet by mouth daily.   Yes Historical Provider, MD  NITROSTAT 0.4 MG SL tablet PLACE 1 TAB UNDER THE TONGUE EVERY 5 MINUTES AS NEEDED FOR CHEST PAIN (SEVERE PRESSURE OR TIGHTNESS) 03/24/14  Yes Leonie Man, MD  ticagrelor (BRILINTA) 90 MG TABS tablet TAKE 1 TABLET (90 MG TOTAL) BY MOUTH 2 (TWO) TIMES DAILY. 03/04/14  Yes Leonie Man, MD  TURMERIC PO Take 800 mg by mouth daily.   Yes Historical Provider, MD  vitamin C (ASCORBIC ACID) 500 MG tablet Take 1,000 mg by mouth daily.   Yes Historical Provider, MD  ondansetron (ZOFRAN ODT) 4 MG disintegrating tablet Take 1 tablet (4 mg total) by mouth every 4 (four) hours as needed for nausea or vomiting. 06/29/14   Charlesetta Shanks, MD   BP 140/61 mmHg  Pulse 75  Temp(Src) 97.4 F (36.3 C)  Resp 18  Ht _0  (1.88 m)  Wt 326 lb (147.873 kg)  BMI 41.84 kg/m2  SpO2 98% Physical Exam  Constitutional: He is oriented to person, place, and time.  The patient is morbidly obese. He is alert and nontoxic. He has no respiratory distress. He appears to be in moderate pain.  HENT:  Head: Normocephalic and atraumatic.  Eyes: EOM are normal. Pupils are equal, round, and reactive to light.  Neck: Neck supple.  Cardiovascular: Normal rate, regular rhythm, normal heart sounds and intact distal pulses.   Pulmonary/Chest: Effort normal and breath sounds normal.  Abdominal: Soft. Bowel sounds are normal. He exhibits no distension. There is tenderness.  Patient has reproduced with tenderness in the right upper quadrant and epigastrium. Lower quadrants are nontender.  Musculoskeletal: Normal range of  motion. He exhibits no edema.  Neurological: He is alert and oriented to person, place, and time. He has normal strength. Coordination normal. GCS eye subscore is 4. GCS verbal subscore is 5. GCS motor subscore is 6.  Skin: Skin is warm, dry and intact.  Psychiatric: He has a normal mood and affect.    ED Course  Procedures (including critical care time) Labs Review Labs  Reviewed  BASIC METABOLIC PANEL - Abnormal; Notable for the following:    Chloride 98 (*)    Glucose, Bld 328 (*)    BUN 28 (*)    Creatinine, Ser 1.38 (*)    Calcium 8.7 (*)    GFR calc non Af Amer 54 (*)    All other components within normal limits  HEPATIC FUNCTION PANEL - Abnormal; Notable for the following:    AST 80 (*)    ALT 72 (*)    All other components within normal limits  CLOSTRIDIUM DIFFICILE BY PCR  STOOL CULTURE  LIPASE, BLOOD  TROPONIN I    Imaging Review US Abdomen Complete  06/29/2014   CLINICAL DATA:  Vomiting.  Abdominal pain  EXAM: ULTRASOUND ABDOMEN COMPLETE  COMPARISON:  CT abdomen pelvis 05/02/2014  FINDINGS: Gallbladder: Gallbladder sludge. Three small non shadowing nonmobile structures in the gallbladder most consistent with polyps. Gallbladder wall not thickened. Negative sonographic Murphy sign.  Common bile duct: Diameter: 3 mm  Liver: Echogenic liver compatible with fatty infiltration. No focal liver lesion.  IVC: Limited  Pancreas: Limited  Spleen: Limited  Right Kidney: Length: 13 cm. Echogenicity within normal limits. No mass or hydronephrosis visualized.  Left Kidney: Length: 13 cm. Echogenicity within normal limits. No mass or hydronephrosis visualized.  Abdominal aorta: No aneurysm visualized.  Other findings: Limited evaluation due to bowel gas and body habitus.  IMPRESSION: Non shadowing lesions in the gallbladder most consistent with polyps. No ductal dilatation  Fatty liver  Study is limited due to bowel gas and body habitus.   Electronically Signed   By: Franchot Gallo M.D.    On: 06/29/2014 12:10   Dg Abd Acute W/chest  06/29/2014   CLINICAL DATA:  Abdominal pain.  EXAM: DG ABDOMEN ACUTE W/ 1V CHEST  COMPARISON:  CT 05/02/2014.  FINDINGS: Low lung volumes with mild bibasilar atelectasis. Soft tissues of the abdomen unremarkable. No bowel distention. No free air. Prior abdominal hernia repair. Prior lumbosacral spine fusion.  IMPRESSION: No acute abnormality .   Electronically Signed   By: Marcello Moores  Register   On: 06/29/2014 09:30     EKG Interpretation   Date/Time:  Wednesday Jun 29 2014 12:00:36 EDT Ventricular Rate:  64 PR Interval:  154 QRS Duration: 94 QT Interval:  422 QTC Calculation: 435 R Axis:   33 Text Interpretation:  Normal sinus rhythm Nonspecific T wave abnormality  Abnormal ECG agree. No significant change from prior Confirmed by  Johnney Killian, MD, Jeannie Done [phone]) on 06/29/2014 12:49:29 PM      MDM   Final diagnoses:  Gastroenteritis  Type 2 diabetes mellitus with complication  The patient is nontoxic. Clinically he is not dehydrated. The patient was referred to the emergency with concerns for possible cholecystitis. At this time he does not have fever, leukocytosis or gall bladder wall thickening on ultrasound. I will provide the patient with a prescription for Zofran for nausea and vomiting. The patient should also obtain a stool culture for C. Difficile before instituting an antidiarrheal. We'll have the patient follow-up with family doctor this week for reassessment. Patient will be advised to use sliding scale insulin.    Charlesetta Shanks, MD 06/29/14 1257

## 2014-06-29 NOTE — Progress Notes (Addendum)
Subjective:  This chart was scribed for [name], MD by Ladene Artist, ED Scribe. The patient was seen in room 11. Patient's care was started at 8:17 AM.   Patient ID: Harold [name], male    DOB: [birth year], 59 y.o.   MRN: [id number]  Chief Complaint  Patient presents with  . Abdominal Pain    RUQ  all symptoms x 3 days  . Nausea  . Emesis  . Chills   HPI HPI Comments: Harold [name] is a 59 y.o. male, with a h/o DM, hypothyroidism, CAD, HTN, COPD, who presents to the [facility] complaining of persistent RUQ abdominal pain for the past 3 days. Pt reports associated worsening nausea since last week, vomiting, diarrhea, chills, loss of appetite for the past 2 days. Pt reports 8-9 episodes of diarrhea yesterday. He checked his A1C which was 7.6 2 days ago. He denies dysuria, any urinary symptoms, fever, any abdominal surgeries. No other alleviating or aggravating factors.  Past Medical History  Diagnosis Date  . Diabetes mellitus   . Carotid artery occlusion   . Complication of anesthesia   . PONV (postoperative nausea and vomiting)     "[place]"  . Hypothyroidism   . Depression     situaltional   . Neuropathy     notably improved following PCI with improved cardiac function  . CAD S/P percutaneous coronary angioplasty 2003, 04/2012    status post PCI to LAD, circumflex-OM 2, RCA  . 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 Dyslipidemia associated with type 2 diabetes mellitus   . GERD (gastroesophageal reflux disease)   . Chronic renal insufficiency, stage II (mild)   . Pulmonary hypertension 04/2012    PA pressure 70mhg  . HTN (hypertension)   . Chronic venous insufficiency     varicosities, no reflux; dopplers 04/14/12- valvular insufficiency in the R and L GSV  . Anginal pain March 2015    Cardiac cath showed patent stents with distal LAD,  circumflex-OM and RCA disease in small vessels.  . Obesity   . Sleep apnea     uses nightly  . Neuromuscular disorder     neuropathy in feet   Current Outpatient Prescriptions on File Prior to Visit  Medication Sig Dispense Refill  . carvedilol (COREG) 6.25 MG tablet TAKE 1 TABLET (6.25 MG TOTAL) BY MOUTH 2 (TWO) TIMES DAILY. 60 tablet 1  . cholecalciferol (VITAMIN D) 1000 UNITS tablet Take 1,000 Units by mouth 4 (four) times daily.     . Choline Fenofibrate (FENOFIBRIC ACID) 135 MG CPDR Take 1 capsule by mouth daily.  6  . escitalopram (LEXAPRO) 20 MG tablet Take 1 tablet (20 mg total) by mouth daily. 30 tablet 11  . esomeprazole (NEXIUM) 40 MG capsule TAKE 40 MG BY MOUTH DAILY BEFORE BREAKFAST. 90 capsule 1  . felodipine (PLENDIL) 10 MG 24 hr tablet TAKE 1 TABLET (10 MG TOTAL) BY MOUTH DAILY. 30 tablet 1  . fish oil-omega-3 fatty acids 1000 MG capsule Take 1,200 mg by mouth daily.     . furosemide (LASIX) 40 MG tablet Take 1 tablet (40 mg total) by mouth daily. (Patient taking differently: Take 20 mg by mouth 2 (two) times daily. ) 30 tablet 6  . Garlic 5443MG CAPS Take 500 mg by mouth daily.    .Marland KitchenHYDROcodone-acetaminophen (NORCO/VICODIN) 5-325 MG per  tablet Take 1 tablet by mouth every 6 (six) hours as needed for moderate pain. 20 tablet 0  . insulin aspart (NOVOLOG) 100 UNIT/ML injection Inject 75 Units into the skin 2 (two) times daily. IN ADDITION 25 MG ONCE ADAY    . insulin NPH (HUMULIN N,NOVOLIN N) 100 UNIT/ML injection Inject 75 Units into the skin 2 (two) times daily before a meal. IN ADDITION 50 MG ONCE ADAY    . Iodine, Kelp, 0.15 MG TABS Take by mouth daily.    Marland Kitchen levothyroxine (SYNTHROID, LEVOTHROID) 88 MCG tablet Take 88 mcg by mouth daily before breakfast.    . lisinopril (PRINIVIL,ZESTRIL) 20 MG tablet Take 0.5 tablets (10 mg total) by mouth daily. 15 tablet 1  . metFORMIN (GLUCOPHAGE) 1000 MG tablet Take 1 tablet (1,000 mg total) by mouth 2 (two) times daily with a meal.      . Misc Natural Products (LUTEIN 20 PO) Take 20 mg by mouth daily.    . Multiple Vitamins-Minerals (MULTIVITAMIN WITH MINERALS) tablet Take 1 tablet by mouth daily.    Marland Kitchen NITROSTAT 0.4 MG SL tablet PLACE 1 TAB UNDER THE TONGUE EVERY 5 MINUTES AS NEEDED FOR CHEST PAIN (SEVERE PRESSURE OR TIGHTNESS) 25 tablet 3  . ticagrelor (BRILINTA) 90 MG TABS tablet TAKE 1 TABLET (90 MG TOTAL) BY MOUTH 2 (TWO) TIMES DAILY. 60 tablet 1  . TURMERIC PO Take 800 mg by mouth daily.    . vitamin C (ASCORBIC ACID) 500 MG tablet Take 1,000 mg by mouth daily.    . [DISCONTINUED] insulin lispro (HUMALOG) 100 UNIT/ML injection Inject 25 Units into the skin 3 (three) times daily before meals.     No current facility-administered medications on file prior to visit.    Allergies  Allergen Reactions  . Septra [Bactrim] Itching  . Penicillins Hives    Allergy to All cillin drugs  Ancef given 9/24 with no obvious reaction  . Clopidogrel     NON RESPONDER  P2Y12  . Other     Walnuts, pecans     Review of Systems  Constitutional: Positive for chills and appetite change. Negative for fever.  Gastrointestinal: Positive for nausea, vomiting, abdominal pain and diarrhea.  Genitourinary: Negative for dysuria.   BP 140/70 mmHg  Pulse 81  Temp(Src) 97.4 F (36.3 C) (Oral)  Resp 16  Ht _0  (1.88 m)  Wt 326 lb (147.873 kg)  BMI 41.84 kg/m2  SpO2 95%    Objective:   Physical Exam  Constitutional: He is oriented to person, place, and time. He appears well-developed and well-nourished. No distress.  HENT:  Head: Normocephalic and atraumatic.  Nose: Nose normal.  Mouth/Throat: Oropharynx is clear and moist.  Eyes: Conjunctivae and EOM are normal.  Neck: Neck supple. No tracheal deviation present.  Cardiovascular: Normal rate, regular rhythm and normal heart sounds.   Pulmonary/Chest: Effort normal and breath sounds normal. No respiratory distress.  Abdominal: Bowel sounds are increased. There is tenderness in  the right upper quadrant and epigastric area. There is no rebound.  Musculoskeletal: Normal range of motion.  Neurological: He is alert and oriented to person, place, and time.  Skin: Skin is warm and dry.  Psychiatric: He has a normal mood and affect. His behavior is normal.  Nursing note and vitals reviewed.   [facility] reading (PRIMARY) by  Dr.Daub there is instrumentation over the lumbar spine. There is evidence of previous mesh placement with multiple curly staples.   Results for orders placed or performed in  visit on 06/29/14  POCT CBC  Result Value Ref Range   WBC 4.1 (A) 4.6 - 10.2 K/uL   Lymph, poc 1.2 0.6 - 3.4   POC LYMPH PERCENT 28.2 10 - 50 %L   MID (cbc) 0.4 0 - 0.9   POC MID % 10.0 0 - 12 %M   POC Granulocyte 2.5 2 - 6.9   Granulocyte percent 61.8 37 - 80 %G   RBC 4.20 (A) 4.69 - 6.13 M/uL   Hemoglobin 12.8 (A) 14.1 - 18.1 g/dL   HCT, POC 36.7 (A) 43.5 - 53.7 %   MCV 87.5 80 - 97 fL   MCH, POC 30.6 27 - 31.2 pg   MCHC 35.0 31.8 - 35.4 g/dL   RDW, POC 14.6 %   Platelet Count, POC 149 142 - 424 K/uL   MPV 7.8 0 - 99.8 fL  POCT glucose (manual entry)  Result Value Ref Range   POC Glucose 303 (A) 70 - 99 mg/dl  POCT urinalysis dipstick  Result Value Ref Range   Color, UA dark yellow    Clarity, UA clear    Glucose, UA >=1000    Bilirubin, UA small    Ketones, UA 15    Spec Grav, UA 1.025    Blood, UA neg    pH, UA 5.5    Protein, UA >=300    Urobilinogen, UA 1.0    Nitrite, UA neg    Leukocytes, UA Negative   POCT UA - Microscopic Only  Result Value Ref Range   WBC, Ur, HPF, POC 0-1    RBC, urine, microscopic 0-5    Bacteria, U Microscopic neg    Mucus, UA neg    Epithelial cells, urine per micros 0-3    Crystals, Ur, HPF, POC neg    Casts, Ur, LPF, POC neg    Yeast, UA neg   [facility] reading (PRIMARY) by  Dr. Everlene Farrier there is no free air present. There is no evidence of obstruction.  Assessment & Plan:  1. Abdominal pain, acute, right upper quadrant  - POCT  CBC - COMPLETE METABOLIC PANEL WITH GFR - POCT urinalysis dipstick - POCT UA - Microscopic Only - Lipase - DG Abd Acute W/Chest; Future  patient sent to the emergency room for consideration for ultrasound abdomen. 2. Diarrhea Suspect related to the acute illness.  3. Non-intractable vomiting with nausea, vomiting of unspecified type Needs gallbladder ultrasound.  4. Diabetes 1.5, managed  as type 1  - POCT glucose (manual entry) this is currently managed by Dr. Mitzi Davenport will go to the emergency room on [address]. I did call triage. He was given a note for this week. I personally performed the services described in this documentation, which was scribed in my presence. The recorded information has been reviewed and is accurate.  Arlyss Queen, MD  [facility]  06/29/2014 9:30 AM

## 2014-06-29 NOTE — ED Notes (Signed)
C/o n/v/d since Friday and upper right abd pain. No fever but had chills.

## 2014-06-29 NOTE — Progress Notes (Deleted)
   Subjective:    Patient ID: Harold [name], male    DOB: 06-Mar-1955, 59 y.o.   MRN: [id number]  HPI    Review of Systems     Objective:   Physical Exam        Assessment & Plan:

## 2014-06-30 ENCOUNTER — Telehealth: Payer: Self-pay

## 2014-06-30 ENCOUNTER — Ambulatory Visit: Payer: PRIVATE HEALTH INSURANCE | Admitting: Podiatry

## 2014-06-30 LAB — CLOSTRIDIUM DIFFICILE BY PCR: Toxigenic C. Difficile by PCR: NEGATIVE

## 2014-06-30 NOTE — Telephone Encounter (Signed)
Pt is returning [facility] phone call regarding lab results. I was unable to reach Angie at this time.

## 2014-07-03 LAB — STOOL CULTURE: SPECIAL REQUESTS: NORMAL

## 2014-07-14 ENCOUNTER — Ambulatory Visit (INDEPENDENT_AMBULATORY_CARE_PROVIDER_SITE_OTHER): Payer: PRIVATE HEALTH INSURANCE | Admitting: Podiatry

## 2014-07-14 ENCOUNTER — Ambulatory Visit (INDEPENDENT_AMBULATORY_CARE_PROVIDER_SITE_OTHER): Payer: PRIVATE HEALTH INSURANCE

## 2014-07-14 ENCOUNTER — Encounter: Payer: Self-pay | Admitting: Podiatry

## 2014-07-14 VITALS — BP 143/80 | HR 61 | Temp 95.9°F | Resp 12

## 2014-07-14 DIAGNOSIS — R52 Pain, unspecified: Secondary | ICD-10-CM

## 2014-07-14 DIAGNOSIS — M779 Enthesopathy, unspecified: Secondary | ICD-10-CM | POA: Diagnosis not present

## 2014-07-14 DIAGNOSIS — E0842 Diabetes mellitus due to underlying condition with diabetic polyneuropathy: Secondary | ICD-10-CM

## 2014-07-14 MED ORDER — GABAPENTIN 400 MG PO CAPS: 400.0000 mg | ORAL_CAPSULE | Freq: Two times a day (BID) | ORAL | Status: DC

## 2014-07-14 NOTE — Progress Notes (Signed)
   Subjective:    Patient ID: Harold [name], male    DOB: 09-28-1955, 59 y.o.   MRN: [id number]  HPI  PT STATED B/L FEET HAVING BURNING AND NUMBNESS FEELING FOR 10 YEARS. FEET ARE GETTING WORSE ESPECIALLY AT NIGHT HAVING NUMBNESS AND WALKING HAVING SHARP PAIN. TRIED NO TREATMENT.  Review of Systems  Constitutional: Positive for fatigue and unexpected weight change.  Respiratory: Positive for shortness of breath.   Cardiovascular: Positive for leg swelling.  Musculoskeletal: Positive for myalgias and gait problem.       Objective:   Physical Exam        Assessment & Plan:

## 2014-07-14 NOTE — Progress Notes (Signed)
Subjective:     Patient ID: Harold [name], male   DOB: 1955/05/23, 59 y.o.   MRN: [id number]  HPI poor health individual long-term diabetes obesity factors with pain in his feet numbing-like sensations and inability to walk any distances or to exercise   Review of Systems  All other systems reviewed and are negative.      Objective:   Physical Exam  Constitutional: He is oriented to person, place, and time.  Cardiovascular: Intact distal pulses.   Musculoskeletal: Normal range of motion.  Neurological: He is oriented to person, place, and time.  Skin: Skin is warm and dry.  Nursing note and vitals reviewed.  neurovascular status intact muscle strength was found to be adequate and I did note diminishment of sharp Dole vibratory of a significant nature. Patient does have equinus condition has high arch foot type with dry skin and keratotic lesion formation heel and arch of both feet and has discomfort in his digits of a nondescript issue. Had bunion correction done 1 year ago on the right foot that did okay     Assessment:     At risk diabetic with nail issues probable neuropathy and structural issues    Plan:     H&P and diabetic education rendered with the importance of daily inspections of his feet. I scanned him for a very light weight diabetic orthotic to try to reduce plantar pressures on his feet and he will start using healthy feet to try to help with his dry skin. Patient will be seen back when those are ready and I also placed him on gabapentin 4 mg to take one at night and if he tolerates it well to go to 2 a day and he'll be reevaluated 3 months or earlier if any issues should occur

## 2014-07-19 ENCOUNTER — Other Ambulatory Visit: Payer: Self-pay | Admitting: Surgery

## 2014-07-22 ENCOUNTER — Telehealth: Payer: Self-pay | Admitting: *Deleted

## 2014-07-22 NOTE — Telephone Encounter (Signed)
RECEIVED REQUEST FOR CARDIAC CLEARANCE  NEED CARDIAC CLEARANCE FOR LAPAROSCOPIC CHOLECYSTECTOMY  UNDER GENERAL ANESTHESIA BY DR Coralie Keens  SURGERY HAS NOT BEEN SCHEDULE.

## 2014-07-25 NOTE — Telephone Encounter (Signed)
Will address on 6/7  [facility]

## 2014-07-26 NOTE — Telephone Encounter (Signed)
ROUTED IN NOTE TO DR Trevor Mace OFFICE: [place]

## 2014-07-26 NOTE — Telephone Encounter (Signed)
    PRE-OP RISK STRATIFICATION / EVALUATION for [employer].   PREOPERATIVE CARDIAC RISK ASSESSMENT  His last clinic visit was in March. He was not actively having anginal symptoms at that time.  Revised Cardiac Risk Index:  High Risk Surgery: Laparoscopic surgery is probably not High Risk ; however if needed to convert to open - it is High Risk.  Defined as Intraperitoneal, intrathoracic or suprainguinal vascular  Active CAD: no; no angina & negative Nuclear Stress test with existing Revascularization  CHF: yes; Diastolic  Cerebrovascular Disease: no;   Diabetes: yes; On Insulin: yes  CKD (Cr >~ 2): no;   Total: 2-3  Estimated Risk of Adverse Outcome: INTERMEDIATE TO HIGH RISK PATIENT  Estimated Risk of MI, PE, VF/VT (Cardiac Arrest), Complete Heart Block: 2 : 6-11%, 3: > 11%   ACC/AHA Guidelines for "Clearance":  Step 1 - Need for Emergency Surgery: No:   If Yes - go straight to OR with perioperative surveillance  Step 2 - Active Cardiac Conditions (Unstable Angina, Decompensated HF, Significant  Arrhytmias - Complete HB, Mobitz II, Symptomatic VT or SVT, Severe Aortic Stenosis - mean gradient > 40 mmHg, Valve area < 1.0 cm2):   No: NO ACTIVE ANGINA  If Yes - Evaluate & Treat per ACC/AHA Guidelines  Step 3 -  Low Risk Surgery: No:   If Yes --> proceed to OR  If No --> Step 4  Step 4 - Functional Capacity >= 4 METS without symptoms: Yes  If Yes --> proceed to OR  If No --> Step 5  Step 5 --  Clinical Risk Factors (CRF)   3 or more: Yes  If Yes -- assess Surgical Risk, --   (High Risk Non-cardiac), Intraabdominal or thoracic vascular surgery consider testing if it will change management.  Intermediate Risk: Proceed to OR with HR control, or consider testing if it will change management  In the absence of ongoing symptoms, I would not pursue evaluation and management. Therefore I would not consider evaluation for ischemia prior to a necessary  surgery. This will serve to delay her needed procedure. If he were to have an abnormal stress test all this would do is postponement further to require cardiac catheterization.  Available data would suggest that revascularization prior to her operation carries greater risk in an asymptomatic patient than medical management alone.   He is on a stable regimen with beta blocker which mitigates risk.  Reduces risk by roughly 1/2.  Recommendation: I would proceed to the OR without any further / additional cardiac evaluation. Continue beta blocker perioperatively. Monitor volume shifts to avoid exacerbation of diastolic heart failure.  Basilar patient's baseline risk with obesity and diabetes that are not changeable, would consider having the procedure done at most the hospital to have cardiology consultation readily available if needed.    Leonie Man, M.D., M.S. Interventional Cardiologist   Pager # [phone]

## 2014-07-28 ENCOUNTER — Other Ambulatory Visit: Payer: Self-pay | Admitting: Surgery

## 2014-07-28 ENCOUNTER — Telehealth: Payer: Self-pay | Admitting: Cardiology

## 2014-07-28 ENCOUNTER — Other Ambulatory Visit: Payer: Self-pay | Admitting: Cardiology

## 2014-07-28 NOTE — Telephone Encounter (Signed)
Rx(s) sent to pharmacy electronically.  

## 2014-07-28 NOTE — Telephone Encounter (Signed)
Patient notified that clearance was sent and he does NOT need an appointment before surgery. He will reach out to [facility] with this info

## 2014-07-28 NOTE — Telephone Encounter (Signed)
Pt needs clarence to have gallbladder surgery. Does he need an appt to get claremce?

## 2014-07-29 ENCOUNTER — Other Ambulatory Visit: Payer: Self-pay | Admitting: Surgery

## 2014-07-29 ENCOUNTER — Telehealth: Payer: Self-pay | Admitting: Cardiology

## 2014-07-29 NOTE — Telephone Encounter (Signed)
PER DR [employer] OFFICE- [facility] [place]  [facility]  NEED TO KNOW HOW LONG TO HOLD  BRILINTA  FOR UPCOMING SURGERY OR DOES HE NEED  BRIDGING AND WHEN TO RESTART.

## 2014-07-29 NOTE — Telephone Encounter (Signed)
Forward to Dr Trevor Mace-- Myrna Blazer

## 2014-07-29 NOTE — Telephone Encounter (Signed)
CALLED LEFT MESSAGE ON [facility] VOICEMAL INFORMATION ROUTED.

## 2014-07-29 NOTE — Telephone Encounter (Signed)
5-7 days.  Restart 1-2d post-op  [facility]

## 2014-08-05 ENCOUNTER — Ambulatory Visit: Payer: PRIVATE HEALTH INSURANCE | Admitting: *Deleted

## 2014-08-05 DIAGNOSIS — M779 Enthesopathy, unspecified: Secondary | ICD-10-CM

## 2014-08-05 NOTE — Patient Instructions (Signed)
WEARING INSTRUCTIONS FOR ORTHOTICS  Don't expect to be comfortable wearing your orthotic devices for the first time.  Like eyeglasses, you may be aware of them as time passes, they will not be uncomfortable and you will enjoy wearing them.  FOLLOW THESE INSTRUCTIONS EXACTLY!  1. Wear your orthotic devices for:       Not more than 1 hour the first day.       Not more than 2 hours the second day.       Not more than 3 hours the third day and so on.        Or wear them for as long as they feel comfortable.       If you experience discomfort in your feet or legs take them out.  When feet & legs feel       better, put them back in.  You do need to be consistent and wear them a little        everyday. 2.   If at any time the orthotic devices become acutely uncomfortable before the       time for that particular day, STOP WEARING THEM. 3.   On the next day, do not increase the wearing time. 4.   Subsequently, increase the wearing time by 15-30 minutes only if comfortable to do       so. 5.   You will be seen by your doctor about 2-4 weeks after you receive your orthotic       devices, at which time you will probably be wearing your devices comfortably        for about 8 hours or more a day. 6.   Some patients occasionally report mild aches or discomfort in other parts of the of       body such as the knees, hips or back after 3 or 4 consecutive hours of wear.  If this       is the case with you, do not extend your wearing time.  Instead, cut it back an hour or       two.  In all likelihood, these symptoms will disappear in a short period of time as your       body posture realigns itself and functions more efficiently. 7.   It is possible that your orthotic device may require some small changes or adjustment       to improve their function or make them more comfortable.   This is usually not done       before one to three months have elapsed.  These adjustments are made in        accordance  with the changed position your feet are assuming as a result of       improved biomechanical function. 8.   In women's shoes, it's not unusual for your heel to slip out of the shoe, particularly if       they are step-in-shoes.  If this is the case, try other shoes or other styles.  Try to       purchase shoes which have deeper heal seats or higher heel counters. 9.   Squeaking of orthotics devices in the shoes is due to the movement of the devices       when they are functioning normally.  To eliminate squeaking, simply dust some       baby powder into your shoes before inserting the devices.  If this does not work,          apply soap or wax to the edges of the orthotic devices or put a tissue into the shoes. 10. It is important that you follow these directions explicitly.  Failure to do so will simply       prolong the adjustment period or create problems which are easily avoided.  It makes       no difference if you are wearing your orthotic devices for only a few hours after        several months, so long as you are wearing them comfortably for those hours. 11. If you have any questions or complaints, contact our office.  We have no way of       knowing about your problems unless you tell us.  If we do not hear from you, we will       assume that you are proceeding well.  

## 2014-08-05 NOTE — Progress Notes (Signed)
Patient ID: Harold [name], male   DOB: [birth year], 59 y.o.   MRN: [id number] PICKING UP INSERTS

## 2014-08-30 ENCOUNTER — Ambulatory Visit (INDEPENDENT_AMBULATORY_CARE_PROVIDER_SITE_OTHER): Payer: PRIVATE HEALTH INSURANCE

## 2014-08-30 ENCOUNTER — Encounter: Payer: Self-pay | Admitting: Emergency Medicine

## 2014-08-30 ENCOUNTER — Ambulatory Visit (INDEPENDENT_AMBULATORY_CARE_PROVIDER_SITE_OTHER): Payer: PRIVATE HEALTH INSURANCE | Admitting: Emergency Medicine

## 2014-08-30 VITALS — BP 168/72 | HR 71 | Temp 97.8°F | Resp 18 | Ht 73.0 in | Wt 329.0 lb

## 2014-08-30 DIAGNOSIS — E108 Type 1 diabetes mellitus with unspecified complications: Secondary | ICD-10-CM

## 2014-08-30 DIAGNOSIS — R1013 Epigastric pain: Secondary | ICD-10-CM

## 2014-08-30 DIAGNOSIS — E038 Other specified hypothyroidism: Secondary | ICD-10-CM

## 2014-08-30 DIAGNOSIS — R0602 Shortness of breath: Secondary | ICD-10-CM | POA: Diagnosis not present

## 2014-08-30 DIAGNOSIS — M25532 Pain in left wrist: Secondary | ICD-10-CM

## 2014-08-30 DIAGNOSIS — Z23 Encounter for immunization: Secondary | ICD-10-CM

## 2014-08-30 DIAGNOSIS — E785 Hyperlipidemia, unspecified: Secondary | ICD-10-CM

## 2014-08-30 DIAGNOSIS — K629 Disease of anus and rectum, unspecified: Secondary | ICD-10-CM

## 2014-08-30 DIAGNOSIS — Z0000 Encounter for general adult medical examination without abnormal findings: Secondary | ICD-10-CM | POA: Diagnosis not present

## 2014-08-30 DIAGNOSIS — E1069 Type 1 diabetes mellitus with other specified complication: Secondary | ICD-10-CM

## 2014-08-30 DIAGNOSIS — E1065 Type 1 diabetes mellitus with hyperglycemia: Secondary | ICD-10-CM

## 2014-08-30 DIAGNOSIS — F329 Major depressive disorder, single episode, unspecified: Secondary | ICD-10-CM | POA: Diagnosis not present

## 2014-08-30 DIAGNOSIS — Z125 Encounter for screening for malignant neoplasm of prostate: Secondary | ICD-10-CM

## 2014-08-30 DIAGNOSIS — F32A Depression, unspecified: Secondary | ICD-10-CM

## 2014-08-30 DIAGNOSIS — IMO0002 Reserved for concepts with insufficient information to code with codable children: Secondary | ICD-10-CM

## 2014-08-30 LAB — CBC WITH DIFFERENTIAL/PLATELET
Basophils Absolute: 0.1 10*3/uL (ref 0.0–0.1)
Basophils Relative: 1 % (ref 0–1)
Eosinophils Absolute: 0.4 10*3/uL (ref 0.0–0.7)
Eosinophils Relative: 6 % — ABNORMAL HIGH (ref 0–5)
HCT: 39.5 % (ref 39.0–52.0)
Hemoglobin: 13.6 g/dL (ref 13.0–17.0)
Lymphocytes Relative: 35 % (ref 12–46)
Lymphs Abs: 2.1 10*3/uL (ref 0.7–4.0)
MCH: 30.4 pg (ref 26.0–34.0)
MCHC: 34.4 g/dL (ref 30.0–36.0)
MCV: 88.4 fL (ref 78.0–100.0)
MPV: 11.6 fL (ref 8.6–12.4)
Monocytes Absolute: 0.4 10*3/uL (ref 0.1–1.0)
Monocytes Relative: 6 % (ref 3–12)
NEUTROS PCT: 52 % (ref 43–77)
Neutro Abs: 3.1 10*3/uL (ref 1.7–7.7)
Platelets: 181 10*3/uL (ref 150–400)
RBC: 4.47 MIL/uL (ref 4.22–5.81)
RDW: 14.5 % (ref 11.5–15.5)
WBC: 5.9 10*3/uL (ref 4.0–10.5)

## 2014-08-30 LAB — IFOBT (OCCULT BLOOD): IFOBT: NEGATIVE

## 2014-08-30 LAB — COMPLETE METABOLIC PANEL WITH GFR
ALT: 41 U/L (ref 0–53)
AST: 34 U/L (ref 0–37)
Albumin: 4 g/dL (ref 3.5–5.2)
Alkaline Phosphatase: 64 U/L (ref 39–117)
BILIRUBIN TOTAL: 0.4 mg/dL (ref 0.2–1.2)
BUN: 28 mg/dL — AB (ref 6–23)
CO2: 24 mEq/L (ref 19–32)
Calcium: 9.4 mg/dL (ref 8.4–10.5)
Chloride: 101 mEq/L (ref 96–112)
Creat: 1.39 mg/dL — ABNORMAL HIGH (ref 0.50–1.35)
GFR, EST AFRICAN AMERICAN: 63 mL/min
GFR, Est Non African American: 55 mL/min — ABNORMAL LOW
Glucose, Bld: 213 mg/dL — ABNORMAL HIGH (ref 70–99)
POTASSIUM: 4.6 meq/L (ref 3.5–5.3)
SODIUM: 138 meq/L (ref 135–145)
Total Protein: 6.3 g/dL (ref 6.0–8.3)

## 2014-08-30 LAB — LIPID PANEL
CHOLESTEROL: 247 mg/dL — AB (ref 0–200)
HDL: 41 mg/dL (ref >=40)
Total CHOL/HDL Ratio: 6 Ratio
Triglycerides: 559 mg/dL — ABNORMAL HIGH (ref <150)

## 2014-08-30 LAB — TSH: TSH: 1.418 u[IU]/mL (ref 0.350–4.500)

## 2014-08-30 NOTE — Progress Notes (Deleted)
Subjective:    Patient ID: Harold [name], male    DOB: 17-Dec-1955, 59 y.o.   MRN: [id number]  HPI    Review of Systems  Constitutional: Positive for fatigue.  HENT: Negative.   Eyes: Negative.   Respiratory: Negative.   Cardiovascular: Negative.   Gastrointestinal: Negative.   Endocrine: Negative.   Genitourinary: Negative.   Musculoskeletal: Positive for arthralgias.  Skin: Negative.   Allergic/Immunologic: Negative.   Neurological: Negative.   Hematological: Negative.   Psychiatric/Behavioral: Negative.        Objective:   Physical Exam        Assessment & Plan:

## 2014-08-30 NOTE — Progress Notes (Addendum)
Subjective:  HPI Comments: This chart was scribed for Arlyss Queen, MDby Rawaa Al Rifaie, Medical Scribe. This patient was seen in Room 21 and the patient's care was started at 2:07 PM.   Patient ID: Harold [name], male    DOB: 1955-02-19, 59 y.o.   MRN: [id number]  HPI HPI Comments: Harold [name] is a 59 y.o. male with a PMHx of CAD, depression, HTN, Neuropathy, CHF, and DM who presents to [facility] for a complete physical exam.  Pt reports having sever left arm pain, gradual onset 2 months ago. Pt notes that the pain is exacerbated with any activity such as writing, and is now getting worse. He states that the pain started on his wrist and now is spread to above and below the wrist area. Pt notes that the pain is not similar to his previous carpal tunnel.   Pt notes that he was put on gabapentin for his leg pain. Pt reports that he presents with no leg swelling in his legs, however as the day goes on, the swelling gradually increases.  He also indicates that he still feels fatigued most of the times.   Pt is aware that he needs to lose weight.   Pt has not taken prevnar.   Review of Systems  Constitutional: Positive for fatigue.  Cardiovascular: Positive for leg swelling.  Musculoskeletal: Positive for arthralgias (arm pain).      Objective:   Physical Exam  Constitutional: He is oriented to person, place, and time. He appears well-developed and well-nourished. No distress.  HENT:  Head: Normocephalic and atraumatic.  Eyes: EOM are normal. Pupils are equal, round, and reactive to light.  Puffiness around both eyes  Neck: Neck supple.  Scar on right side of neck   Cardiovascular: Normal rate and regular rhythm.  Exam reveals no gallop and no friction rub.   No murmur heard. Pulmonary/Chest: Effort normal.  Dec breath sound on both bases  Abdominal:  protuberant  Genitourinary: Prostate normal and penis normal.  Slightly firm  X  cm area  just inside the anus   Musculoskeletal:  2+ edema is extremities  Neurological: He is alert and oriented to person, place, and time. No cranial nerve deficit.  Decrease sanitation in both feet.    Skin: Skin is warm and dry.  Psychiatric: He has a normal mood and affect. His behavior is normal.  Nursing note and vitals reviewed. [facility] reading (PRIMARY) by  Dr.Liticia Gasior there is no fracture seen there are no acute changes seen there appears to be some soft tissue swelling over the radial styloid     Assessment & Plan:  1. Routine general medical examination at a health care facility  - CBC with Differential/Platelet  - IFOBT POC (occult bld, rslt in office)  2. Type I diabetes mellitus with complication, uncontrolled  - HM Diabetes Foot Exam - Pneumococcal conjugate vaccine 13-valent IM - COMPLETE METABOLIC PANEL WITH GFR - Microalbumin, urine  3. Wrist pain, acute, left This is most consistent with a day clear veins tenosynovitis he is referred to Dr. Amedeo Plenty for his evaluation - DG Wrist Complete Left; Future  4. Shortness of breath This is multifactorial due to his obesity. As well as his coronary disease  5. Depression This is stable at present  6. Epigastric pain He has had no recurrent  7. Hyperlipidemia  - Lipid panel  8. Other specified hypothyroidism  - TSH  9. Screening for prostate cancer  -  PSA 10 Anal mass Patient referred to Dr. Benson Norway for him to check a firm area I felt at the anal verge I personally performed the services described in this documentation, which was scribed in my presence. The recorded information has been reviewed and is accurate.  Arlyss Queen, MD  [facility]  08/30/2014 2:41 PM

## 2014-08-31 LAB — PSA: PSA: 0.34 ng/mL (ref <=4.00)

## 2014-08-31 LAB — MICROALBUMIN, URINE: Microalb, Ur: 15.7 mg/dL — ABNORMAL HIGH (ref <2.0)

## 2014-09-01 ENCOUNTER — Ambulatory Visit (INDEPENDENT_AMBULATORY_CARE_PROVIDER_SITE_OTHER): Payer: PRIVATE HEALTH INSURANCE | Admitting: Podiatry

## 2014-09-01 ENCOUNTER — Encounter: Payer: Self-pay | Admitting: Podiatry

## 2014-09-01 VITALS — BP 153/79 | HR 65 | Resp 18

## 2014-09-01 DIAGNOSIS — M79674 Pain in right toe(s): Principal | ICD-10-CM

## 2014-09-01 DIAGNOSIS — E1140 Type 2 diabetes mellitus with diabetic neuropathy, unspecified: Secondary | ICD-10-CM

## 2014-09-01 DIAGNOSIS — B351 Tinea unguium: Secondary | ICD-10-CM | POA: Diagnosis not present

## 2014-09-01 DIAGNOSIS — E0842 Diabetes mellitus due to underlying condition with diabetic polyneuropathy: Secondary | ICD-10-CM | POA: Diagnosis not present

## 2014-09-01 DIAGNOSIS — M79676 Pain in unspecified toe(s): Secondary | ICD-10-CM

## 2014-09-01 DIAGNOSIS — E1149 Type 2 diabetes mellitus with other diabetic neurological complication: Secondary | ICD-10-CM

## 2014-09-01 NOTE — Progress Notes (Signed)
Patient ID: Harold [name], male   DOB: [birth year], 59 y.o.   MRN: [id number] HPI  Complaint:  Visit Type: Patient returns to my office for continued preventative foot care services. Complaint: Patient states" my nails have grown long and thick and become painful to walk and wear shoes" Patient has been diagnosed with DM with severe neuropathy.Marland Kitchen He presents for preventative foot care services. No changes to ROS.  He has been wearing diabetic insoles in his shoes. Bunion correction one year ago right foot.  Podiatric Exam: Vascular: dorsalis pedis and posterior tibial pulses are negative. Capillary return is immediate. Temperature gradient is negative. Skin turgor WNL, bilateral swelling  Sensorium: Absent Semmes Weinstein monofilament test. Diminished tactile sensation bilaterally.  Nail Exam: Pt has thick disfigured discolored nails with subungual debris noted right  entire nail hallux  Ulcer Exam: There is no evidence of ulcer or pre-ulcerative changes or infection. Orthopedic Exam: Muscle tone and strength are WNL. No limitations in general ROM. No crepitus or effusions noted. Foot type and digits show no abnormalities. Bony prominences are unremarkable. Skin: No Porokeratosis. No infection or ulcers  Diagnosis:  Tinea unguium, Pain in right toe, pain in left toes  Treatment & Plan Procedures and Treatment: Consent by patient was obtained for treatment procedures. The patient understood the discussion of treatment and procedures well. All questions were answered thoroughly reviewed. Debridement of mycotic and hypertrophic toenails, 1 through 5 bilateral and clearing of subungual debris. No ulceration, no infection noted. Continue with insoles and gabapentin Return Visit-Office Procedure: Patient instructed to return to the office for a follow up visit 3 months for continued evaluation and treatment.

## 2014-09-09 ENCOUNTER — Telehealth: Payer: Self-pay | Admitting: Cardiology

## 2014-09-09 ENCOUNTER — Telehealth: Payer: Self-pay | Admitting: Pharmacist Clinician (PhC)/ Clinical Pharmacy Specialist

## 2014-09-09 NOTE — Telephone Encounter (Signed)
LM for patient that message will be forwarded to Dr. Ellyn Hack to review labs in [facility] and provide advice as necessary and he will be contacted.

## 2014-09-09 NOTE — Telephone Encounter (Signed)
Pt called in stating that he had some test ran at Dr. Perfecto Kingdom office and he was told that something looked abnormal on his kidney test and wanted Dr. Ellyn Hack to check it out. He says that test results were sent over last week. Please call  Thanks

## 2014-09-09 NOTE — Telephone Encounter (Signed)
Pt called and said he was returning call to the doctor assistant,I thought it was the nurse. I see where you called also.

## 2014-09-09 NOTE — Telephone Encounter (Signed)
Harold [name] spoke with patient regarding labs and he has appt 7/28 for lipid clinic

## 2014-09-09 NOTE — Telephone Encounter (Signed)
LMOM for patient to return call - see if we can get him scheduled for lipid clinic appt.

## 2014-09-09 NOTE — Telephone Encounter (Signed)
Returning your call.

## 2014-09-09 NOTE — Telephone Encounter (Signed)
appt set for lipid clinic

## 2014-09-10 NOTE — Telephone Encounter (Signed)
Kidney function shows slight worsening previously noted several months ago. Relatively stable however over the last couple months. 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 The numbers is barely above the upper limit of normal. Nothing to worry about at this particular point.  The other issue is the elevated triglycerides and an inability to measure HDL. We really trying to handle on this. If Dr. Redmond Pulling would like to try something as fine otherwise we may consider referral for PCSK9 inhibition treatment with Erasmo Downer.   Leonie Man, MD

## 2014-09-15 ENCOUNTER — Ambulatory Visit
Payer: No Typology Code available for payment source | Admitting: Pharmacist Clinician (PhC)/ Clinical Pharmacy Specialist

## 2014-09-16 NOTE — Telephone Encounter (Signed)
Spoke with patient. He reports he had to cancel 7/28 lipid clinic appmt. He states he will call back to reschedule. Explained other labs but stressed that Dr. Ellyn Hack wanted to get his cholesterol under control.   Routed to MD as Juluis Rainier

## 2014-09-18 ENCOUNTER — Other Ambulatory Visit: Payer: Self-pay | Admitting: Cardiology

## 2014-09-19 NOTE — Telephone Encounter (Signed)
REFILL 

## 2014-10-17 ENCOUNTER — Other Ambulatory Visit: Payer: Self-pay | Admitting: Cardiology

## 2014-10-17 NOTE — Telephone Encounter (Signed)
REFILL 

## 2014-11-07 ENCOUNTER — Other Ambulatory Visit: Payer: Self-pay | Admitting: Cardiology

## 2014-11-07 NOTE — Telephone Encounter (Signed)
Rx request sent to pharmacy.

## 2014-11-16 ENCOUNTER — Other Ambulatory Visit: Payer: Self-pay | Admitting: Cardiology

## 2014-11-16 ENCOUNTER — Other Ambulatory Visit: Payer: Self-pay

## 2014-11-16 NOTE — Telephone Encounter (Signed)
Rx(s) sent to pharmacy electronically.

## 2014-11-22 ENCOUNTER — Encounter: Payer: Self-pay | Admitting: Emergency Medicine

## 2014-12-05 ENCOUNTER — Ambulatory Visit: Payer: Self-pay

## 2014-12-06 ENCOUNTER — Other Ambulatory Visit: Payer: Self-pay | Admitting: Cardiology

## 2014-12-08 ENCOUNTER — Ambulatory Visit: Payer: PRIVATE HEALTH INSURANCE | Admitting: Podiatry

## 2014-12-16 ENCOUNTER — Telehealth: Payer: Self-pay | Admitting: Cardiology

## 2014-12-16 ENCOUNTER — Other Ambulatory Visit: Payer: Self-pay

## 2014-12-16 ENCOUNTER — Telehealth: Payer: Self-pay

## 2014-12-16 MED ORDER — FUROSEMIDE 40 MG PO TABS: 40.0000 mg | ORAL_TABLET | Freq: Every day | ORAL | Status: DC

## 2014-12-16 NOTE — Telephone Encounter (Signed)
May not be a bad idea to come in -- would have to be with APP, b/c I do not have any clinic days for 2 weeks.  Leonie Man, MD

## 2014-12-16 NOTE — Telephone Encounter (Signed)
STAT if patient is at the pharmacy , call can be transferred to refill team.   1. Which medications need to be refilled? Furosemide   2. Which pharmacy/location is medication to be sent to? [facility] in [place]  3. Do they need a 30 day or 90 day supply? 30  * PT ALSO CALLED IN STATING THAT HIS BP HAS BEEN RUNNING AT 180/80 CONSISTANTLY AND WOULD TO KNOW IF HE COULD BE SEEN SOONER*

## 2014-12-16 NOTE — Telephone Encounter (Signed)
Please review patient's notes.

## 2014-12-16 NOTE — Telephone Encounter (Signed)
Left detailed message on voicemail concerning patient's elevated BP, MD suggested patient come in to see NP or PA since he didn't have any openings for 2 weeks.  I had already refilled meds.

## 2014-12-16 NOTE — Telephone Encounter (Signed)
Please review this patient's earlier message.  I have already okayed the refills, however I feel he needs to come in earlier with a consistent  BP of 180/80. He does not see Dr. Ellyn Hack again until 02-23-15.

## 2014-12-17 ENCOUNTER — Ambulatory Visit (INDEPENDENT_AMBULATORY_CARE_PROVIDER_SITE_OTHER): Payer: PRIVATE HEALTH INSURANCE | Admitting: Family Medicine

## 2014-12-17 VITALS — BP 140/76 | HR 72 | Temp 97.4°F | Ht 76.0 in | Wt 330.4 lb

## 2014-12-17 DIAGNOSIS — E1149 Type 2 diabetes mellitus with other diabetic neurological complication: Secondary | ICD-10-CM

## 2014-12-17 DIAGNOSIS — J449 Chronic obstructive pulmonary disease, unspecified: Secondary | ICD-10-CM | POA: Diagnosis not present

## 2014-12-17 DIAGNOSIS — J019 Acute sinusitis, unspecified: Secondary | ICD-10-CM | POA: Diagnosis not present

## 2014-12-17 DIAGNOSIS — N189 Chronic kidney disease, unspecified: Secondary | ICD-10-CM | POA: Diagnosis not present

## 2014-12-17 DIAGNOSIS — N182 Chronic kidney disease, stage 2 (mild): Secondary | ICD-10-CM

## 2014-12-17 MED ORDER — GUAIFENESIN ER 1200 MG PO TB12: 1.0000 | ORAL_TABLET | Freq: Two times a day (BID) | ORAL | Status: DC | PRN

## 2014-12-17 MED ORDER — CEFDINIR 300 MG PO CAPS: 300.0000 mg | ORAL_CAPSULE | Freq: Two times a day (BID) | ORAL | Status: DC

## 2014-12-17 MED ORDER — BENZONATATE 100 MG PO CAPS: 100.0000 mg | ORAL_CAPSULE | Freq: Three times a day (TID) | ORAL | Status: DC | PRN

## 2014-12-17 MED ORDER — IPRATROPIUM BROMIDE 0.03 % NA SOLN: 2.0000 | Freq: Four times a day (QID) | NASAL | Status: DC

## 2014-12-17 MED ORDER — LISINOPRIL 20 MG PO TABS: 20.0000 mg | ORAL_TABLET | Freq: Every day | ORAL | Status: DC

## 2014-12-17 NOTE — Progress Notes (Signed)
Subjective:    Patient ID: Harold [name], male    DOB: 18-Dec-1955, 59 y.o.   MRN: [id number] This chart was scribed for Delman Cheadle, MD by Marti Sleigh, Medical Scribe. This patient was seen in Room 4 and the patient's care was started a 10:47 AM.  Chief Complaint  Patient presents with  . Cough    3 to 4 days--productive cough--no fever--no n/v--tender in the abdominal area  . Nasal Congestion  . Headache    with some sinus pressure  . Ear Fullness    from the right side     HPI HPI Comments: Harold [name] is a 59 y.o. male who presents to [facility] complaining of sore throat with swollen glands for the last four days, right worse than left. He states his sore throat has improved, but he has subsequently developed a cough productive of yellow sputum as well as HA, nasal congestion, and right sided ear pain. He does not smoke. He denies sick contacts, fever, chills, nausea or vomiting. He states his sx are worse today that they have been any other day.   He called his cardiologist yesterday stating the this BP had been 160-180/80 for the last two weeks, and they recommended he come in and be seen by a PA.   Past Medical History  Diagnosis Date  . Diabetes mellitus   . Carotid artery occlusion   . Complication of anesthesia   . PONV (postoperative nausea and vomiting)     "[place]"  . Hypothyroidism   . Depression     situaltional   . Neuropathy ([place])     notably improved following PCI with improved cardiac function  . CAD S/P percutaneous coronary angioplasty 2003, 04/2012    status post PCI to LAD, circumflex-OM 2, RCA  . 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 Dyslipidemia associated with type 2 diabetes mellitus ([place])   . GERD (gastroesophageal reflux disease)   . Chronic renal insufficiency, stage II (mild)   . Pulmonary hypertension ([place]) 04/2012    PA pressure 19mhg    . HTN (hypertension)   . Chronic venous insufficiency     varicosities, no reflux; dopplers 04/14/12- valvular insufficiency in the R and L GSV  . Anginal pain ([facility] March 2015    Cardiac cath showed patent stents with distal LAD, circumflex-OM and RCA disease in small vessels.  . Obesity   . Sleep apnea     uses nightly  . Neuromuscular disorder (HCC)     neuropathy in feet   Allergies  Allergen Reactions  . Septra [Bactrim] Itching  . Penicillins Hives    Allergy to All cillin drugs  Ancef given 9/24 with no obvious reaction  . Clopidogrel     NON RESPONDER  P2Y12  . Other     Walnuts, pecans    Current Outpatient Prescriptions on File Prior to Visit  Medication Sig Dispense Refill  . BRILINTA 90 MG TABS tablet TAKE 1 TABLET (90 MG TOTAL) BY MOUTH 2 (TWO) TIMES DAILY. 60 tablet 3  . carvedilol (COREG) 6.25 MG tablet TAKE 1 TABLET (6.25 MG TOTAL) BY MOUTH 2 (TWO) TIMES DAILY. 60 tablet 6  . cholecalciferol (VITAMIN D) 1000 UNITS tablet Take 1,000 Units by mouth 4 (four) times daily.     . Choline Fenofibrate (FENOFIBRIC ACID) 135 MG CPDR Take 1 capsule by mouth daily.  6  . escitalopram (LEXAPRO) 20 MG tablet Take 1 tablet (20 mg total) by mouth daily. 30 tablet 11  . esomeprazole (NEXIUM) 40 MG capsule TAKE 1 CAPSULE BY MOUTH DAILY BEFORE BREAKFAST. 90 capsule 0  . felodipine (PLENDIL) 10 MG 24 hr tablet TAKE 1 TABLET (10 MG TOTAL) BY MOUTH DAILY. 30 tablet 1  . fish oil-omega-3 fatty acids 1000 MG capsule Take 1,200 mg by mouth daily.     . furosemide (LASIX) 40 MG tablet Take 1 tablet (40 mg total) by mouth daily. <PLEASE MAKE APPOINTMENT FOR REFILLS> 30 tablet 1  . gabapentin (NEURONTIN) 400 MG capsule Take 1 capsule (400 mg total) by mouth 2 (two) times daily. 60 capsule 5  . Garlic 654 MG CAPS Take 500 mg by mouth daily.    Marland Kitchen HYDROcodone-acetaminophen (NORCO/VICODIN) 5-325 MG per tablet Take 1 tablet by mouth every 6 (six) hours as needed for moderate pain. 20 tablet 0  .  insulin aspart (NOVOLOG) 100 UNIT/ML injection Inject 75 Units into the skin 2 (two) times daily. IN ADDITION 25 MG ONCE ADAY    . Insulin Detemir (LEVEMIR) 100 UNIT/ML Pen Inject into the skin 2 (two) times daily. 75 units twice a day    . levothyroxine (SYNTHROID, LEVOTHROID) 88 MCG tablet Take 88 mcg by mouth daily before breakfast.    . lisinopril (PRINIVIL,ZESTRIL) 20 MG tablet Take 0.5 tablets (10 mg total) by mouth daily. 15 tablet 1  . Lutein 20 MG TABS Take by mouth.    . metFORMIN (GLUCOPHAGE) 1000 MG tablet Take 1 tablet (1,000 mg total) by mouth 2 (two) times daily with a meal.    . Misc Natural Products (LUTEIN 20 PO) Take 20 mg by mouth daily.    . Multiple Vitamins-Minerals (MULTIVITAMIN WITH MINERALS) tablet Take 1 tablet by mouth daily.    Marland Kitchen NITROSTAT 0.4 MG SL tablet PLACE 1 TAB UNDER THE TONGUE EVERY 5 MINUTES AS NEEDED FOR CHEST PAIN (SEVERE PRESSURE OR TIGHTNESS) 25 tablet 3  . ondansetron (ZOFRAN ODT) 4 MG disintegrating tablet Take 1 tablet (4 mg total) by mouth every 4 (four) hours as needed for nausea or vomiting. 20 tablet 0  . OVER THE COUNTER MEDICATION Sea kelp 150 mcg once per dAY    . TURMERIC PO Take 800 mg by mouth daily.    . vitamin C (ASCORBIC ACID) 500 MG tablet Take 1,000 mg by mouth daily.    . felodipine (PLENDIL) 10 MG 24 hr tablet TAKE 1 TABLET BY MOUTH DAILY. (Patient not taking: Reported on 08/30/2014) 30 tablet 8  . [DISCONTINUED] insulin lispro (HUMALOG) 100 UNIT/ML injection Inject 25 Units into the skin 3 (three) times daily before meals.     No current facility-administered medications on file prior to visit.   Depression screen [facility] 2/9 12/17/2014 08/30/2014 06/01/2013 05/25/2012  Decreased Interest 0 0 0 0  Down, Depressed, Hopeless 0 0 0 0  PHQ - 2 Score 0 0 0 0      Review of Systems  Constitutional: Positive for activity change and fatigue. Negative for fever, chills, appetite change and unexpected weight change.  HENT: Positive for  congestion, ear pain, postnasal drip, rhinorrhea, sinus pressure and sore throat. Negative for facial swelling, nosebleeds, trouble swallowing and voice change.   Respiratory: Positive for cough. Negative for chest tightness, shortness of breath and wheezing.   Cardiovascular: Negative for chest pain and palpitations.  Gastrointestinal: Negative for nausea, vomiting and abdominal pain.  Genitourinary: Negative for dysuria.  Skin: Negative for color change.  Psychiatric/Behavioral: Positive for sleep disturbance. Negative for dysphoric mood. The patient is not nervous/anxious.        Objective:  BP 140/76 mmHg  Pulse 72  Temp(Src) 97.4 F (36.3 C) (Oral)  Ht 6' 4" (1.93 m)  Wt 330 lb 6.4 oz (149.868 kg)  BMI 40.23 kg/m2  SpO2 97%  Physical Exam  Constitutional: He is oriented to person, place, and time. He appears well-developed and well-nourished. No distress.  HENT:  Head: Normocephalic and atraumatic.  Oropharynx is edematous and erythematous, with 2+ tonsils. TMs normal. Nares normal.   Eyes: Pupils are equal, round, and reactive to light.  Neck: Neck supple.  Cardiovascular: Normal rate, regular rhythm and normal heart sounds.   No murmur heard. Pulmonary/Chest: Effort normal and breath sounds normal. No respiratory distress. He has no wheezes.  Musculoskeletal: Normal range of motion.  Neurological: He is alert and oriented to person, place, and time. Coordination normal.  Skin: Skin is warm and dry. He is not diaphoretic.  Psychiatric: He has a normal mood and affect. His behavior is normal.  Nursing note and vitals reviewed.     Assessment & Plan:   1. Chronic obstructive pulmonary disease, unspecified COPD type ([place])   2. Diabetes mellitus type 2 with neurological manifestations ([place])   3. Chronic renal insufficiency, stage II (mild)   4. Acute sinusitis, recurrence not specified, unspecified location - on day 3-4 of illness but will proceed with antibiotic trx since  pt is immmunsuppressed due to above comorbid illnesses.    Meds ordered this encounter  Medications  . lisinopril (PRINIVIL,ZESTRIL) 20 MG tablet    Sig: Take 1 tablet (20 mg total) by mouth daily.    Dispense:  30 tablet    Refill:  2  . cefdinir (OMNICEF) 300 MG capsule    Sig: Take 1 capsule (300 mg total) by mouth 2 (two) times daily.    Dispense:  20 capsule    Refill:  0  . ipratropium (ATROVENT) 0.03 % nasal spray    Sig: Place 2 sprays into the nose 4 (four) times daily.    Dispense:  30 mL    Refill:  1  . Guaifenesin (MUCINEX MAXIMUM STRENGTH) 1200 MG TB12    Sig: Take 1 tablet (1,200 mg total) by mouth every 12 (twelve) hours as needed.    Dispense:  14 tablet    Refill:  1  . benzonatate (TESSALON) 100 MG capsule    Sig: Take 1-2 capsules (100-200 mg total) by mouth 3 (three) times daily as needed for cough.    Dispense:  40 capsule    Refill:  0    I personally performed the services described in this documentation, which was scribed in my presence. The recorded information has been reviewed and considered, and addended by me as needed.  Delman Cheadle, MD MPH  By signing my name below, I, Judithe Modest, attest that this documentation has been prepared under the direction and in the presence of Delman Cheadle, MD. Electronically Signed: Judithe Modest, ER Scribe. 12/17/2014. 10:48 AM.

## 2014-12-17 NOTE — Patient Instructions (Signed)
Sinusitis, Adult °Sinusitis is redness, soreness, and inflammation of the paranasal sinuses. Paranasal sinuses are air pockets within the bones of your face. They are located beneath your eyes, in the middle of your forehead, and above your eyes. In healthy paranasal sinuses, mucus is able to drain out, and air is able to circulate through them by way of your nose. However, when your paranasal sinuses are inflamed, mucus and air can become trapped. This can allow bacteria and other germs to grow and cause infection. °Sinusitis can develop quickly and last only a short time (acute) or continue over a long period (chronic). Sinusitis that lasts for more than 12 weeks is considered chronic. °CAUSES °Causes of sinusitis include: °· Allergies. °· Structural abnormalities, such as displacement of the cartilage that separates your nostrils (deviated septum), which can decrease the air flow through your nose and sinuses and affect sinus drainage. °· Functional abnormalities, such as when the small hairs (cilia) that line your sinuses and help remove mucus do not work properly or are not present. °SIGNS AND SYMPTOMS °Symptoms of acute and chronic sinusitis are the same. The primary symptoms are pain and pressure around the affected sinuses. Other symptoms include: °· Upper toothache. °· Earache. °· Headache. °· Bad breath. °· Decreased sense of smell and taste. °· A cough, which worsens when you are lying flat. °· Fatigue. °· Fever. °· Thick drainage from your nose, which often is green and may contain pus (purulent). °· Swelling and warmth over the affected sinuses. °DIAGNOSIS °Your health care provider will perform a physical exam. During your exam, your health care provider may perform any of the following to help determine if you have acute sinusitis or chronic sinusitis: °· Look in your nose for signs of abnormal growths in your nostrils (nasal polyps). °· Tap over the affected sinus to check for signs of  infection. °· View the inside of your sinuses using an imaging device that has a light attached (endoscope). °If your health care provider suspects that you have chronic sinusitis, one or more of the following tests may be recommended: °· Allergy tests. °· Nasal culture. A sample of mucus is taken from your nose, sent to a lab, and screened for bacteria. °· Nasal cytology. A sample of mucus is taken from your nose and examined by your health care provider to determine if your sinusitis is related to an allergy. °TREATMENT °Most cases of acute sinusitis are related to a viral infection and will resolve on their own within 10 days. Sometimes, medicines are prescribed to help relieve symptoms of both acute and chronic sinusitis. These may include pain medicines, decongestants, nasal steroid sprays, or saline sprays. °However, for sinusitis related to a bacterial infection, your health care provider will prescribe antibiotic medicines. These are medicines that will help kill the bacteria causing the infection. °Rarely, sinusitis is caused by a fungal infection. In these cases, your health care provider will prescribe antifungal medicine. °For some cases of chronic sinusitis, surgery is needed. Generally, these are cases in which sinusitis recurs more than 3 times per year, despite other treatments. °HOME CARE INSTRUCTIONS °· Drink plenty of water. Water helps thin the mucus so your sinuses can drain more easily. °· Use a humidifier. °· Inhale steam 3-4 times a day (for example, sit in the bathroom with the shower running). °· Apply a warm, moist washcloth to your face 3-4 times a day, or as directed by your health care provider. °· Use saline nasal sprays to help   moisten and clean your sinuses.  Take medicines only as directed by your health care provider.  If you were prescribed either an antibiotic or antifungal medicine, finish it all even if you start to feel better. SEEK IMMEDIATE MEDICAL CARE IF:  You have  increasing pain or severe headaches.  You have nausea, vomiting, or drowsiness.  You have swelling around your face.  You have vision problems.  You have a stiff neck.  You have difficulty breathing.   This information is not intended to replace advice given to you by your health care provider. Make sure you discuss any questions you have with your health care provider.   Document Released: 02/04/2005 Document Revised: 02/25/2014 Document Reviewed: 02/19/2011 Elsevier Interactive Patient Education [employer].

## 2015-01-12 ENCOUNTER — Other Ambulatory Visit: Payer: Self-pay | Admitting: Cardiology

## 2015-01-20 ENCOUNTER — Ambulatory Visit (INDEPENDENT_AMBULATORY_CARE_PROVIDER_SITE_OTHER): Payer: PRIVATE HEALTH INSURANCE | Admitting: Podiatry

## 2015-01-20 ENCOUNTER — Encounter: Payer: Self-pay | Admitting: Podiatry

## 2015-01-20 VITALS — BP 162/78 | HR 63 | Resp 16

## 2015-01-20 DIAGNOSIS — S90211A Contusion of right great toe with damage to nail, initial encounter: Secondary | ICD-10-CM | POA: Diagnosis not present

## 2015-01-20 DIAGNOSIS — Q828 Other specified congenital malformations of skin: Secondary | ICD-10-CM

## 2015-01-20 DIAGNOSIS — E1149 Type 2 diabetes mellitus with other diabetic neurological complication: Secondary | ICD-10-CM

## 2015-01-20 DIAGNOSIS — R234 Changes in skin texture: Secondary | ICD-10-CM

## 2015-01-20 MED ORDER — GABAPENTIN 400 MG PO CAPS: 400.0000 mg | ORAL_CAPSULE | Freq: Three times a day (TID) | ORAL | Status: DC

## 2015-01-22 NOTE — Progress Notes (Signed)
Subjective:     Patient ID: Harold [name], male   DOB: 11-11-1955, 59 y.o.   MRN: [id number]  HPI patient presents stating I'm doing quite a bit better and it seems medicines helping but I need a higher dose and I have a split on my left big toe that I wanted you did check   Review of Systems     Objective:   Physical Exam  neurovascular status intact muscle strength adequate with some dryness with splitting the left big toe medial side but no drainage or indication of infection    Assessment:      patient does have neuropathic disease with splitting of skin left that's non- infected at the current time    Plan:      reviewed condition and debrided some tissue on the left big toe with no drainage and we will increase his gabapentin but I advised for him to follow up with his family physician

## 2015-02-09 ENCOUNTER — Ambulatory Visit: Payer: No Typology Code available for payment source | Admitting: Family

## 2015-02-09 ENCOUNTER — Ambulatory Visit: Payer: PRIVATE HEALTH INSURANCE | Admitting: Family

## 2015-02-09 ENCOUNTER — Other Ambulatory Visit (HOSPITAL_COMMUNITY): Payer: PRIVATE HEALTH INSURANCE

## 2015-02-09 ENCOUNTER — Encounter (HOSPITAL_COMMUNITY): Payer: PRIVATE HEALTH INSURANCE

## 2015-02-13 ENCOUNTER — Other Ambulatory Visit: Payer: Self-pay | Admitting: Cardiology

## 2015-02-14 NOTE — Telephone Encounter (Signed)
Needs refill x 0 E -sent pharmacy appointment schedule  02/23/15

## 2015-02-21 ENCOUNTER — Other Ambulatory Visit: Payer: Self-pay | Admitting: Cardiology

## 2015-02-21 NOTE — Telephone Encounter (Signed)
Rx request sent to pharmacy.  

## 2015-02-23 ENCOUNTER — Ambulatory Visit (INDEPENDENT_AMBULATORY_CARE_PROVIDER_SITE_OTHER): Payer: No Typology Code available for payment source | Admitting: Cardiology

## 2015-02-23 VITALS — BP 126/70 | HR 62 | Ht 73.0 in | Wt 332.5 lb

## 2015-02-23 DIAGNOSIS — I2510 Atherosclerotic heart disease of native coronary artery without angina pectoris: Secondary | ICD-10-CM | POA: Diagnosis not present

## 2015-02-23 DIAGNOSIS — I878 Other specified disorders of veins: Secondary | ICD-10-CM

## 2015-02-23 DIAGNOSIS — I5032 Chronic diastolic (congestive) heart failure: Secondary | ICD-10-CM | POA: Diagnosis not present

## 2015-02-23 DIAGNOSIS — R079 Chest pain, unspecified: Secondary | ICD-10-CM

## 2015-02-23 DIAGNOSIS — I10 Essential (primary) hypertension: Secondary | ICD-10-CM | POA: Diagnosis not present

## 2015-02-23 DIAGNOSIS — E1169 Type 2 diabetes mellitus with other specified complication: Secondary | ICD-10-CM

## 2015-02-23 DIAGNOSIS — J984 Other disorders of lung: Secondary | ICD-10-CM

## 2015-02-23 DIAGNOSIS — I272 Other secondary pulmonary hypertension: Secondary | ICD-10-CM

## 2015-02-23 DIAGNOSIS — E782 Mixed hyperlipidemia: Secondary | ICD-10-CM

## 2015-02-23 DIAGNOSIS — Z9861 Coronary angioplasty status: Secondary | ICD-10-CM

## 2015-02-23 NOTE — Patient Instructions (Signed)
NO CHANGE IN CURRENT MEDICATIONS  TAKE LASIX (FUROSEMIDE) BETWEEN 12 NOON- 2 PM.  WATCH YOUR SYMPTOMS IF CHEST PAIN CONTINUE CALL OFFICE.  TRY TO COMBINED (BAND) BOOT FOR SWELLING.   Your physician wants you to follow-up in 3-6 [place].  You will receive a reminder letter in the mail two months in advance. If you don't receive a letter, please call our office to schedule the follow-up appointment.

## 2015-02-23 NOTE — Progress Notes (Signed)
PCP: [name], MD  Clinic Note: Chief Complaint  Patient presents with  . Annual Exam    CAD//pt c/o painful chest pressure, comes and goes--started 2-3 weeks ago  . Edema    bilateral legs/feet/ankles/pt states it is a normal occurance.  . Coronary Artery Disease  . Congestive Heart Failure    HPI: Harold [name] is a 60 y.o. male with a PMH below who presents today for delayed six-month follow-up of CAD-PCI, chronic diastolic heart failure with pulmonary hypertension and edema.  Harold [name] was last seen in March of last year.  Recent Hospitalizations: None  Studies Reviewed: None  Interval History: [name] presents today with 3 major issues. 1. He has had fleeting episodes of bilateral substernal chest tightness and aching sensations that last less than a minute. These occur at rest, and not with exertion. Not associated with dyspnea. Nothing really makes it worse. Does not occur every day 2. Significant feet swelling. Worse than last time. Cannot wear compression stockings. Starting to have otitis changes. 3. Persistent exertional dyspnea   No chest pain with rest or exertion, but he does note exertional dyspnea which is stable. He is asked to trying to walk more, and be more active. He just can't do a lot of exercise gives his back pain and neuropathy. Feet hurt terribly with the significant edema. He sleeps on 2 pillows little bit of possible orthopnea but no PND symptoms. No palpitations, lightheadedness, dizziness, weakness or syncope/near syncope. No TIA/amaurosis fugax symptoms. No claudication.  ROS: A comprehensive was performed. Review of Systems  Constitutional: Positive for malaise/fatigue (Exercise tolerance).  Respiratory: Positive for shortness of breath.   Cardiovascular: Positive for chest pain, orthopnea and leg swelling.       See history of present illness  Gastrointestinal: Negative for blood in stool and melena.       Abdominal  fullness  Genitourinary: Negative for hematuria.  Musculoskeletal: Positive for back pain (Chronic - keeps him from exercise & bending) and joint pain.  Skin:       Redness and discomfort and his skin from knees down. He also has dry plaque appearing "rash "on his ankles and feet. Left great toe has a crack in the skin, but not infected.  Neurological: Negative for dizziness and headaches.  Endo/Heme/Allergies: Does not bruise/bleed easily.  Psychiatric/Behavioral: Negative for depression. The patient is not nervous/anxious.   All other systems reviewed and are negative.    Past Medical History  Diagnosis Date  . Diabetes mellitus   . Carotid artery occlusion   . Complication of anesthesia   . PONV (postoperative nausea and vomiting)     "[place]"  . Hypothyroidism   . Depression     situaltional   . Neuropathy ([place])     notably improved following PCI with improved cardiac function  . CAD S/P percutaneous coronary angioplasty 2003, 04/2012    status post PCI to LAD, circumflex-OM 2, RCA  . 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 Dyslipidemia associated with type 2 diabetes mellitus ([place])   . GERD (gastroesophageal reflux disease)   . Chronic renal insufficiency, stage II (mild)   . Pulmonary hypertension ([place]) 04/2012    PA pressure 27mhg  . HTN (hypertension)   . Chronic venous insufficiency     varicosities, no reflux; dopplers 04/14/12- valvular insufficiency in the R and L  GSV  . Anginal pain [facility]) March 2015    Cardiac cath showed patent stents with distal LAD, circumflex-OM and RCA disease in small vessels.  . Obesity   . Sleep apnea     uses nightly  . Neuromuscular disorder ([place])     neuropathy in feet    Past Surgical History  Procedure Laterality Date  . Umbilical hernia repair  2009    steel mesh insert  . Coronary angioplasty  12/21/2001    PTCA of the distal  and mid AV groove circ, unsuccessful PTCA of second OM total occlusion, unsuccessful PTCA of the apical LAD total occlusion  . Colonscopy    . Carotid endarterectomy  2005    Right; recent carotid Dopplers notes elevated velocities.   . Back surgery  2005 x1    X2-2010  . Lumbar laminectomy/decompression microdiscectomy  01/07/2012    Procedure: LUMBAR LAMINECTOMY/DECOMPRESSION MICRODISCECTOMY 1 LEVEL;  Surgeon: Winfield Cunas, MD;  Location: [facility];  Service: Neurosurgery;  Laterality: Bilateral;   Lumbar Three-Four Decompression  . Coronary stent placement  04/28/12    PCI to LAD (3x75m Xience DES postdilated to 3.25) and circ prox and mid (2 overlappinmg 2.567m12mmXience DES postdilated to 2.7540m . Cardiac catheterization  12/11/2001    significant 3V CAD, normal LV function  . Coronary angioplasty with stent placement  04/29/12    PCI to 3 RCA lesions, Promus Premiere 2.63m57mm distally, mid was 2.5mx250m and proximally 2.75x16mm,71m55-60%  . Doppler echocardiography  04/28/2012    poor quality study: EF estimated 60-65%; unable to assess diastolic function (previously noted to have diastolic dysfunction); severely dilated left atrium and mild right atrium; dilated IVC consistent with increased central venous pressure..  . Carotid doppler  04/27/2012    right internal carotid: Elevated velocities but no evidence of plaque. Left internal carotid 40-59%  . Abis  04/27/2012    mild bilateral arterial insufficiency  . Cardiac catheterization  March 2015    Moderate Pulm HTN: 46/16 - mean 33 mmHg; PCWP 22mmHg73[name], widely patent proximal and mid cervical extends with previous subtotal occlusion of smaller tube, I be patent RCA ostial mid and distal stents with 70% distal continuation RCA disease  . Bunionectomy Right 11/11/2013    Procedure: RIGHT FOOT SILVER BUNIONECTOMY;  Surgeon: John HeWylene SimmerLocation: [facility]ice: Orthopedics;  Laterality: Right;  . Abdominal aortagram N/A 11/15/2011    Procedure: ABDOMINAL AORTAGRAM;  Surgeon: CharlesElam DutchLocation[facility];  Service: Cardiovascular;  [name];  . Left and right heart catheterization with coronary angiogram N/A 04/27/2012    Procedure: LEFT AND RIGHT HEART CATHETERIZATION WITH CORONARY ANGIOGRAM;  Surgeon: David WLeonie ManLocation[facility];  Service: Cardiovascular;  [name];  . Percutaneous coronary stent intervention (pci-s) N/A 04/28/2012    Procedure: PERCUTANEOUS CORONARY STENT INTERVENTION (PCI-S);  Surgeon: Thomas Troy SineLocation: [facility];  Service: Cardiovascular;  [name];  . Percutaneous coronary stent intervention (pci-s) N/A 04/29/2012    Procedure: PERCUTANEOUS CORONARY STENT INTERVENTION (PCI-S);  Surgeon: David WLeonie ManLocation[facility];  Service: Cardiovascular;  [name];  . Left and right heart catheterization with coronary angiogram N/A 05/14/2013    Procedure: LEFT AND RIGHT HEART CATHETERIZATION WITH CORONARY ANGIOGRAM;  Surgeon: Thomas Troy SineLocation: [facility];  Service: Cardiovascular;  [name];    Prior to Admission medications   Medication Sig Start Date End Date Taking? Authorizing Provider  benzonatate (TESSALON) 100 MG capsule Take 1-2 capsules (100-200 mg total) by mouth 3 (three) times daily as needed for cough. 12/17/14   Shawnee Knapp, MD  BRILINTA 90 MG TABS tablet TAKE 1 TABLET (90 MG TOTAL) BY MOUTH 2 (TWO) TIMES DAILY. 02/14/15   Leonie Man, MD  carvedilol (COREG) 6.25 MG tablet TAKE 1 TABLET (6.25 MG TOTAL) BY MOUTH 2 (TWO) TIMES DAILY. 12/06/14   Leonie Man, MD  cholecalciferol (VITAMIN D) 1000 UNITS tablet Take 1,000 Units by mouth 4 (four) times daily.     Historical Provider, MD  Choline Fenofibrate (FENOFIBRIC ACID) 135 MG CPDR Take 1 capsule by mouth daily. 01/06/14   Historical Provider, MD    escitalopram (LEXAPRO) 20 MG tablet Take 1 tablet (20 mg total) by mouth daily. 02/16/14   Araceli Bouche, PA  esomeprazole (NEXIUM) 40 MG capsule TAKE 1 CAPSULE BY MOUTH DAILY BEFORE BREAKFAST. 01/16/15   Leonie Man, MD  felodipine (PLENDIL) 10 MG 24 hr tablet TAKE 1 TABLET (10 MG TOTAL) BY MOUTH DAILY. 03/04/14   Leonie Man, MD  felodipine (PLENDIL) 10 MG 24 hr tablet TAKE 1 TABLET BY MOUTH DAILY. Patient not taking: Reported on 08/30/2014 07/28/14   Leonie Man, MD  fish oil-omega-3 fatty acids 1000 MG capsule Take 1,200 mg by mouth daily.     Historical Provider, MD  furosemide (LASIX) 40 MG tablet TAKE 1 TABLET BY MOUTH DAILY. MAKE APPOINTMENT FOR REFILLS 02/21/15   Leonie Man, MD  gabapentin (NEURONTIN) 400 MG capsule Take 1 capsule (400 mg total) by mouth 3 (three) times daily. 01/20/15   Wallene Huh, DPM  Garlic 841 MG CAPS Take 500 mg by mouth daily.    Historical Provider, MD  Guaifenesin (MUCINEX MAXIMUM STRENGTH) 1200 MG TB12 Take 1 tablet (1,200 mg total) by mouth every 12 (twelve) hours as needed. 12/17/14   Shawnee Knapp, MD  insulin aspart (NOVOLOG) 100 UNIT/ML injection Inject 75 Units into the skin 2 (two) times daily. IN ADDITION 25 MG ONCE ADAY 04/29/11   [name], MD  Insulin Detemir (LEVEMIR) 100 UNIT/ML Pen Inject into the skin 2 (two) times daily. 75 units twice a day    Historical Provider, MD  ipratropium (ATROVENT) 0.03 % nasal spray Place 2 sprays into the nose 4 (four) times daily. 12/17/14   Shawnee Knapp, MD  levothyroxine (SYNTHROID, LEVOTHROID) 88 MCG tablet Take 88 mcg by mouth daily before breakfast.    Historical Provider, MD  lisinopril (PRINIVIL,ZESTRIL) 20 MG tablet Take 1 tablet (20 mg total) by mouth daily. 12/17/14   Shawnee Knapp, MD  Lutein 20 MG TABS Take by mouth.    Historical Provider, MD  metFORMIN (GLUCOPHAGE) 1000 MG tablet Take 1 tablet (1,000 mg total) by mouth 2 (two) times daily with a meal. 05/17/13   Isaiah Serge, NP  Misc Natural  Products (LUTEIN 20 PO) Take 20 mg by mouth daily.    Historical Provider, MD  Multiple Vitamins-Minerals (MULTIVITAMIN WITH MINERALS) tablet Take 1 tablet by mouth daily.    Historical Provider, MD  NITROSTAT 0.4 MG SL tablet PLACE 1 TAB UNDER THE TONGUE EVERY 5 MINUTES AS NEEDED FOR CHEST PAIN (SEVERE PRESSURE OR TIGHTNESS) 03/24/14   Leonie Man, MD  ondansetron (ZOFRAN ODT) 4 MG disintegrating tablet Take 1  tablet (4 mg total) by mouth every 4 (four) hours as needed for nausea or vomiting. 06/29/14   Charlesetta Shanks, MD  OVER THE COUNTER MEDICATION Sea kelp 150 mcg once per dAY    Historical Provider, MD  TURMERIC PO Take 800 mg by mouth daily.    Historical Provider, MD  vitamin C (ASCORBIC ACID) 500 MG tablet Take 1,000 mg by mouth daily.    Historical Provider, MD   Allergies  Allergen Reactions  . Septra [Bactrim] Itching  . Penicillins Hives    Allergy to All cillin drugs  Ancef given 9/24 with no obvious reaction  . Clopidogrel     NON RESPONDER  P2Y12  . Other     Walnuts, pecans      Social History   Social History  . Marital Status: Single    Spouse Name: N/A  . Number of Children: 0  . Years of Education: hs   Occupational History  . [employer]   .     Social History Main Topics  . Smoking status: Former Smoker -- 1.00 packs/day for 42 years    Types: Cigarettes    Quit date: 03/23/2003  . Smokeless tobacco: Never Used  . Alcohol Use: No  . Drug Use: No  . Sexual Activity: Not Asked   Other Topics Concern  . None   Social History Narrative   He and his partner [name] have now officially gotten married on 05/26/2013.  [name] is also a patient of our clinic.  He is now initially hyphenated his last name.   He works at [employer].   He does not smoke, he quit in 2009.  He does not drink alcohol.   He was adopted and no family history is known.    Family History  Problem Relation Age of Onset  . Adopted: Yes  . Family history  unknown: Yes     Wt Readings from Last 3 Encounters:  02/23/15 332 lb 8 oz (150.821 kg)  12/17/14 330 lb 6.4 oz (149.868 kg)  08/30/14 329 lb (149.233 kg)    PHYSICAL EXAM BP 126/70 mmHg  Pulse 62  Ht _0  (1.854 m)  Wt 332 lb 8 oz (150.821 kg)  BMI 43.88 kg/m2 General appearance: alert, cooperative, appears stated age, no distress and morbidly obese; Normal Mood & Affect Neck: no adenopathy, no carotid bruit and no JVD Lungs: clear to auscultation bilaterally, normal percussion bilaterally and non-labored Heart: regular rate and rhythm, distant but normal S1& S2, no murmur, click, rub or gallop; unable to palpate PMI Abdomen: soft, non-tender; bowel sounds normal; no masses,  no organomegaly; Cannot palpate for HSM due to obesity Extremities: extremities normal, atraumatic, no cyanosis, and edema 2+ to knees with venous stasis changes Pulses: 2+ and symmetric B UE - cannot palpate LE pulses due to edema Skin: mobility and turgor normal; BLE venous stasis changes with dry scaly changes on ankles & feet; L Great toe - crack in skin, no redness or bleeding. Neurologic: Mental status: Alert, oriented, thought content appropriate Cranial nerves: normal (II-XII grossly intact)   Adult ECG Report  Rate: 62 ;  Rhythm: normal sinus rhythm and T wave abnormality, consider lateral ischemia;   Narrative Interpretation: Stable EKG   Other studies Reviewed: Additional studies/ records that were reviewed today include:  Recent Labs:   Lab Results  Component Value Date   CHOL 247* 08/30/2014   HDL 41 08/30/2014   LDLCALC NOT CALC 08/30/2014   TRIG  559* 08/30/2014   CHOLHDL 6.0 08/30/2014  -- was supposed to see Tommy Medal for [facility].  ASSESSMENT / PLAN: Problem List Items Addressed This Visit    Venous stasis of both lower extremities - with edema (Chronic)    He can't bend over enough to put on compression stockings. We talked about using Ace wraps and Coban and/Unaboot  materials. Need to watch for skin dryness. We talked about keeping his feet moist since he has some cracks on his toes. Watch for signs of wounds that are poorly healing. Release we know that he has had arterial Doppler showing no evidence of significant PAD. He does have deep venous insufficiency as well as superficial venous insufficiency.      Restrictive lung disease (Chronic)    Clearly related to his weight. Probably contributing to be stable ventilation syndrome.      Pulmonary hypertension, 13mhg (Chronic)    Multifactorial: Has to do with elevated LVEDP with pulmonary venous congestion from diastolic heart failure, likely OSA from obesity/OHS. Also has some baseline COPD.  Clearly leads to exertional dyspnea, but he continues to try to stay active. Need to continue to work on weight loss efforts and continued CPAP. Need to stay aggressive with diuresis and afterload reduction.      Obesity, Class III, BMI 40-49.9 (morbid obesity) (HCC) (Chronic)    Finally his weight is stabilized. Now it is our working on losing some weight. He is trying to eat healthy, just eats too much. He really needs to burn calories. We talked again about trying to do more exercise. He is just limited by really significant back pain.      Essential hypertension (Chronic)    Blood pressures look well today on carvedilol and lisinopril plus felodipine.      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  He had myalgias with elevated LFTs and CK levels while on accommodation of fibroid and statin. He does have high triglycerides mostly related to obesity and insulin dependent diabetes.  He did not do well with fibrate in the past, but is now on fenofibrate acid--> unfortunately we need to control his lipids.  We'll need to recheck lipids and referred back  to KTommy Medal RPH-CCP      Chronic diastolic heart failure, NYHA class 2 - PCWPf 22 mmHg during catheterization. - Primary (Chronic)    Not really complaining of PND and orthopnea now. I think most of his head elevation is because of his profound abdominal compression on his diaphragm.  He is on stable dose of carvedilol and ACE inhibitor. Also on calcium channel blocker for afterload reduction and blood pressure control. He is on a standing dose of Lasix which I recommended that he take a little later on in the morning, with occasional additional dose at closer to 4:00 in the afternoon -- his swelling is worse at night.  Continue sliding-scale diuretic.      Relevant Orders   EKG 12-Lead   Chest pain with low risk for cardiac etiology    He has brief little spells of tightness in his chest lasting less than a minute. He is always occur at rest. He does not note them when he is walking. He has deathly tried to pick at them out of walking he's been doing. I don't think this is  anginal in nature. Most likely musculoskeletal. Somewhat reproducible on exam along both sides of the sternum.   Probably consistent with costochondritis.      Relevant Orders   EKG 12-Lead   CAD, LAD/CFX DES 04/28/12- staged RCA DES 04/29/12 after pt declined CABG, cath 05/14/13 stable CAD medical therapy (Chronic)    He does have discomfort in his chest, but it doesn't seem to be anginal in nature. His major symptom that he had from CAD was profound dyspnea. His shortness of breath is pretty stable if not improved. Likely related more to obesity and deconditioning. He is on stable dose of carvedilol, calcium channel blocker, ACE inhibitor and Brilinta. No bleeding issues.   Did very poorly on accommodation statin plus fibroid in the past for his lipids. Is therefore not on a statin.      Relevant Orders   EKG 12-Lead      Current medicines are reviewed at length with the patient today. (+/- concerns)  N/A The following changes have been made:  NO CHANGE IN CURRENT MEDICATIONS  TAKE LASIX (FUROSEMIDE) BETWEEN 12 NOON- 2 PM.  WATCH YOUR SYMPTOMS IF CHEST PAIN CONTINUE CALL OFFICE.  TRY TO COMBINED (BAND) BOOT FOR SWELLING.   Your physician wants you to follow-up in 3-6 [place].  Will call pt to re-set appt with Erasmo Downer in Lipid clinic.  Need f/u lipids. (will call pt).  Studies Ordered:   Orders Placed This Encounter  Procedures  . EKG 12-Lead      Leonie Man, M.D., M.S. Interventional Cardiologist   Pager # [phone]

## 2015-02-25 ENCOUNTER — Encounter: Payer: Self-pay | Admitting: Cardiology

## 2015-02-25 DIAGNOSIS — I878 Other specified disorders of veins: Secondary | ICD-10-CM | POA: Insufficient documentation

## 2015-02-25 NOTE — Assessment & Plan Note (Signed)
Finally his weight is stabilized. Now it is our working on losing some weight. He is trying to eat healthy, just eats too much. He really needs to burn calories. We talked again about trying to do more exercise. He is just limited by really significant back pain.

## 2015-02-25 NOTE — Assessment & Plan Note (Signed)
Not really complaining of PND and orthopnea now. I think most of his head elevation is because of his profound abdominal compression on his diaphragm.  He is on stable dose of carvedilol and ACE inhibitor. Also on calcium channel blocker for afterload reduction and blood pressure control. He is on a standing dose of Lasix which I recommended that he take a little later on in the morning, with occasional additional dose at closer to 4:00 in the afternoon -- his swelling is worse at night.  Continue sliding-scale diuretic.

## 2015-02-25 NOTE — Assessment & Plan Note (Signed)
Clearly related to his weight. Probably contributing to be stable ventilation syndrome.

## 2015-02-25 NOTE — Assessment & Plan Note (Signed)
He has brief little spells of tightness in his chest lasting less than a minute. He is always occur at rest. He does not note them when he is walking. He has deathly tried to pick at them out of walking he's been doing. I don't think this is anginal in nature. Most likely musculoskeletal. Somewhat reproducible on exam along both sides of the sternum.   Probably consistent with costochondritis.

## 2015-02-25 NOTE — Assessment & Plan Note (Signed)
He can't bend over enough to put on compression stockings. We talked about using Ace wraps and Coban and/Unaboot materials. Need to watch for skin dryness. We talked about keeping his feet moist since he has some cracks on his toes. Watch for signs of wounds that are poorly healing. Release we know that he has had arterial Doppler showing no evidence of significant PAD. He does have deep venous insufficiency as well as superficial venous insufficiency.

## 2015-02-25 NOTE — Assessment & Plan Note (Signed)
Multifactorial: Has to do with elevated LVEDP with pulmonary venous congestion from diastolic heart failure, likely OSA from obesity/OHS. Also has some baseline COPD.  Clearly leads to exertional dyspnea, but he continues to try to stay active. Need to continue to work on weight loss efforts and continued CPAP. Need to stay aggressive with diuresis and afterload reduction.

## 2015-02-25 NOTE — Assessment & Plan Note (Signed)
He does have discomfort in his chest, but it doesn't seem to be anginal in nature. His major symptom that he had from CAD was profound dyspnea. His shortness of breath is pretty stable if not improved. Likely related more to obesity and deconditioning. He is on stable dose of carvedilol, calcium channel blocker, ACE inhibitor and Brilinta. No bleeding issues.   Did very poorly on accommodation statin plus fibroid in the past for his lipids. Is therefore not on a statin.

## 2015-02-25 NOTE — Assessment & Plan Note (Signed)
Blood pressures look well today on carvedilol and lisinopril plus felodipine.

## 2015-02-25 NOTE — Assessment & Plan Note (Addendum)
He never did make it to his lipid clinic appointment with our pharmacist.  He had myalgias with elevated LFTs and CK levels while on accommodation of fibroid and statin. He does have high triglycerides mostly related to obesity and insulin dependent diabetes.  He did not do well with fibrate in the past, but is now on fenofibrate acid--> unfortunately we need to control his lipids.  We'll need to recheck lipids and referred back to Tommy Medal, RPH-CCP

## 2015-03-01 ENCOUNTER — Telehealth: Payer: Self-pay | Admitting: *Deleted

## 2015-03-01 DIAGNOSIS — Z79899 Other long term (current) drug therapy: Secondary | ICD-10-CM

## 2015-03-01 DIAGNOSIS — E782 Mixed hyperlipidemia: Principal | ICD-10-CM

## 2015-03-01 DIAGNOSIS — I2510 Atherosclerotic heart disease of native coronary artery without angina pectoris: Secondary | ICD-10-CM

## 2015-03-01 DIAGNOSIS — E1169 Type 2 diabetes mellitus with other specified complication: Secondary | ICD-10-CM

## 2015-03-01 DIAGNOSIS — Z9861 Coronary angioplasty status: Secondary | ICD-10-CM

## 2015-03-01 NOTE — Telephone Encounter (Signed)
SPOKE TO PATIENT  LAB SLIP WILL BE MAILED TO PATIENT. PATIENT STATES HE WILL CALL BACK TO SET UP  APPOINTMENT WITH KRISITIN

## 2015-03-01 NOTE — Telephone Encounter (Signed)
Benton-Elliot, Princella Ion >','<< Less Detail',event)" href="javascript:;">More Detail >>   Leonie Man, MD   Sent: Sat February 25, 2015 10:24 PM    To: Raiford Simmonds, RN        Message     Ivin Booty - I forgot that we need to get Noell back in touch with Erasmo Downer for Lipid clinic. I did not realize that he never made the appt.    Willl need to recheck lipids & CMP before she sees him.        Leonie Man, MD

## 2015-03-03 ENCOUNTER — Encounter: Payer: Self-pay | Admitting: Family

## 2015-03-05 ENCOUNTER — Telehealth: Payer: Self-pay | Admitting: Family Medicine

## 2015-03-05 NOTE — Telephone Encounter (Signed)
lmom to call and reschedule appt with daub

## 2015-03-07 ENCOUNTER — Telehealth: Payer: Self-pay | Admitting: Family Medicine

## 2015-03-07 ENCOUNTER — Encounter: Payer: Self-pay | Admitting: Emergency Medicine

## 2015-03-07 ENCOUNTER — Ambulatory Visit (INDEPENDENT_AMBULATORY_CARE_PROVIDER_SITE_OTHER): Payer: PRIVATE HEALTH INSURANCE | Admitting: Emergency Medicine

## 2015-03-07 VITALS — BP 130/80 | HR 65 | Temp 97.6°F | Resp 16 | Ht 73.0 in | Wt 330.0 lb

## 2015-03-07 DIAGNOSIS — J449 Chronic obstructive pulmonary disease, unspecified: Secondary | ICD-10-CM | POA: Diagnosis not present

## 2015-03-07 DIAGNOSIS — E1149 Type 2 diabetes mellitus with other diabetic neurological complication: Secondary | ICD-10-CM | POA: Diagnosis not present

## 2015-03-07 DIAGNOSIS — I10 Essential (primary) hypertension: Secondary | ICD-10-CM

## 2015-03-07 LAB — BASIC METABOLIC PANEL WITH GFR
BUN: 28 mg/dL — AB (ref 7–25)
CHLORIDE: 96 mmol/L — AB (ref 98–110)
CO2: 28 mmol/L (ref 20–31)
CREATININE: 1.53 mg/dL — AB (ref 0.70–1.25)
Calcium: 9.4 mg/dL (ref 8.6–10.3)
GFR, Est African American: 56 mL/min — ABNORMAL LOW (ref >=60)
GFR, Est Non African American: 48 mL/min — ABNORMAL LOW (ref >=60)
GLUCOSE: 488 mg/dL — AB (ref 65–99)
Potassium: 5 mmol/L (ref 3.5–5.3)
Sodium: 136 mmol/L (ref 135–146)

## 2015-03-07 LAB — POCT GLYCOSYLATED HEMOGLOBIN (HGB A1C): Hemoglobin A1C: 11

## 2015-03-07 LAB — GLUCOSE, POCT (MANUAL RESULT ENTRY): POC Glucose: <444 mg/dl (ref 70–99)

## 2015-03-07 NOTE — Progress Notes (Addendum)
Patient ID: Harold [name], male   DOB: [birth year], 60 y.o.   MRN: [id number]     By signing my name below, I, Zola Button, attest that this documentation has been prepared under the direction and in the presence of Arlyss Queen, MD.  Electronically Signed: Zola Button, Medical Scribe. 03/07/2015. 11:38 AM.   Chief Complaint:  Chief Complaint  Patient presents with  . Advice Only    est care with another provider    HPI: Harold [name] is a 60 y.o. male with a history of DM, HTN, COPD, CAD, pulmonary hypertension, chronic diastolic heart failure, GERD, and neuropathy who reports to [facility] today for advice only in regards to establishing care with another provider.  Patient is currently wearing a brace on his left wrist. He has had 3 cortisone injections that have not provided relief to his wrist pain. He notes that he will wear the brace for 2 weeks, and if this does not provide relief, he is to proceed with surgery.  He is currently on Novolog and Levemir for diabetes, but he does not think they have been keeping his blood sugar under control. Patient notes that he has severe diarrhea with metformin. He states that Dr. Suzette Battiest put him back on metformin despite his insistence not to start on metformin again. Patient did have severe diarrhea again with the metformin, so he stopped it on his own.  Patient has had both pneumonia vaccines and the flu shot. His last Tdap was in 2008.  Doctors: Dr. Oneida Alar, vascular surgeon [name], neurosurgeon Dr. Ellyn Hack, cardiologist Dr. Amedeo Plenty, orthopedist Dr. Suzette Battiest, endocrinologist Dr. Gershon Crane, ophthalmologist  Patient is still working full-time, 40+ hours a week.   Past Medical History  Diagnosis Date  . Diabetes mellitus   . Carotid artery occlusion   . Complication of anesthesia   . PONV (postoperative nausea and vomiting)     "[place]"  . Hypothyroidism   . Depression     situaltional   . Neuropathy ([place])     notably  improved following PCI with improved cardiac function  . CAD S/P percutaneous coronary angioplasty 2003, 04/2012    status post PCI to LAD, circumflex-OM 2, RCA  . 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 Dyslipidemia associated with type 2 diabetes mellitus ([place])   . GERD (gastroesophageal reflux disease)   . Chronic renal insufficiency, stage II (mild)   . Pulmonary hypertension ([place]) 04/2012    PA pressure 3mhg  . HTN (hypertension)   . Chronic venous insufficiency     varicosities, no reflux; dopplers 04/14/12- valvular insufficiency in the R and L GSV  . Anginal pain ([facility] March 2015    Cardiac cath showed patent stents with distal LAD, circumflex-OM and RCA disease in small vessels.  . Obesity   . Sleep apnea     uses nightly  . [place]romuscular disorder [place]     neuropathy in feet   Past Surgical History  Procedure Laterality Date  . Umbilical hernia repair  2009    steel mesh insert  . Coronary angioplasty  12/21/2001    PTCA of the distal and mid AV groove circ, unsuccessful PTCA of second OM total occlusion, unsuccessful PTCA of the apical LAD total occlusion  . Colonscopy    . Carotid endarterectomy  2005    Right; recent carotid Dopplers notes elevated velocities.   .Marland Kitchen  Back surgery  2005 x1    X2-2010  . Lumbar laminectomy/decompression microdiscectomy  01/07/2012    Procedure: LUMBAR LAMINECTOMY/DECOMPRESSION MICRODISCECTOMY 1 LEVEL;  Surgeon: Winfield Cunas, MD;  Location: [place] [facility];  Service: Neurosurgery;  Laterality: Bilateral;   Lumbar Three-Four Decompression  . Coronary stent placement  04/28/12    PCI to LAD (3x42m Xience DES postdilated to 3.25) and circ prox and mid (2 overlappinmg 2.53m12mmXience DES postdilated to 2.7564m . Cardiac catheterization  12/11/2001    significant 3V CAD, normal LV function  . Coronary angioplasty with stent placement  04/29/12    PCI to  3 RCA lesions, Promus Premiere 2.46m70mm distally, mid was 2.5mx260m and proximally 2.75x16mm,34m55-60%  . Doppler echocardiography  04/28/2012    poor quality study: EF estimated 60-65%; unable to assess diastolic function (previously noted to have diastolic dysfunction); severely dilated left atrium and mild right atrium; dilated IVC consistent with increased central venous pressure..  . Carotid doppler  04/27/2012    right internal carotid: Elevated velocities but no evidence of plaque. Left internal carotid 40-59%  . Abis  04/27/2012    mild bilateral arterial insufficiency  . Cardiac catheterization  March 2015    Moderate Pulm HTN: 46/16 - mean 33 mmHg; PCWP 22mmHg40[name], widely patent proximal and mid cervical extends with previous subtotal occlusion of smaller tube, I be patent RCA ostial mid and distal stents with 70% distal continuation RCA disease  . Bunionectomy Right 11/11/2013    Procedure: RIGHT FOOT SILVER BUNIONECTOMY;  Surgeon: John HeWylene SimmerLocation: [facility]: [facility];  Laterality: Right;  . Abdominal aortagram N/A 11/15/2011    Procedure: ABDOMINAL AORTAGRAM;  Surgeon: CharlesElam DutchLocation[facility];  Service: Cardiovascular;  [name];  . Left and right heart catheterization with coronary angiogram N/A 04/27/2012    Procedure: LEFT AND RIGHT HEART CATHETERIZATION WITH CORONARY ANGIOGRAM;  Surgeon: David WLeonie ManLocation: [facility];  Service: Cardiovascular;  [name];  . Percutaneous coronary stent intervention (pci-s) N/A 04/28/2012    Procedure: PERCUTANEOUS CORONARY STENT INTERVENTION (PCI-S);  Surgeon: Thomas Troy SineLocation[facility];  Service: Cardiovascular;  [name];  . Percutaneous coronary stent intervention (pci-s) N/A 04/29/2012    Procedure: PERCUTANEOUS CORONARY STENT INTERVENTION (PCI-S);  Surgeon: David WLeonie Man [facility];  Service: Cardiovascular;  [name];  . Left and right heart catheterization with coronary angiogram N/A 05/14/2013    Procedure: LEFT AND RIGHT HEART CATHETERIZATION WITH CORONARY ANGIOGRAM;  Surgeon: Thomas Troy SineLocation[facility];  Service: Cardiovascular;  [name];   Social History   Social History  . Marital Status: Single    Spouse Name: N/A  . Number of Children: 0  . Years of Education: hs   Occupational History  . [employer]   .     Social History Main Topics  . Smoking status: Former Smoker -- 1.00 packs/day for 42 years    Types: Cigarettes    Quit date: 03/23/2003  . Smokeless tobacco: Never Used  . Alcohol Use: No  . Drug Use: No  . Sexual Activity: Not Asked   Other Topics Concern  . None   Social History Narrative   He and his partner George Iona Beardow officially gotten married on 05/26/2013.  George Iona Beardo a  patient of our clinic.  He is now initially hyphenated his last name.   He works at [employer].   He does not smoke, he quit in 2009.  He does not drink alcohol.   He was adopted and no family history is known.    Family History  Problem Relation Age of Onset  . Adopted: Yes  . Family history unknown: Yes   Allergies  Allergen Reactions  . Septra [Bactrim] Itching  . Penicillins Hives    Allergy to All cillin drugs  Ancef given 9/24 with no obvious reaction  . Clopidogrel     NON RESPONDER  P2Y12  . Metformin And Related   . Other     Walnuts, pecans    Prior to Admission medications   Medication Sig Start Date End Date Taking? Authorizing Provider  BRILINTA 90 MG TABS tablet TAKE 1 TABLET (90 MG TOTAL) BY MOUTH 2 (TWO) TIMES DAILY. 02/14/15  Yes Leonie Man, MD  carvedilol (COREG) 6.25 MG tablet TAKE 1 TABLET (6.25 MG TOTAL) BY MOUTH 2 (TWO) TIMES DAILY. 12/06/14  Yes Leonie Man, MD  cholecalciferol (VITAMIN D) 1000 UNITS tablet Take 1,000 Units by mouth 4  (four) times daily.    Yes Historical Provider, MD  Choline Fenofibrate (FENOFIBRIC ACID) 135 MG CPDR Take 1 capsule by mouth daily. 01/06/14  Yes Historical Provider, MD  escitalopram (LEXAPRO) 20 MG tablet Take 1 tablet (20 mg total) by mouth daily. 02/16/14  Yes Todd McVeigh, PA  esomeprazole (NEXIUM) 40 MG capsule TAKE 1 CAPSULE BY MOUTH DAILY BEFORE BREAKFAST. 01/16/15  Yes Leonie Man, MD  felodipine (PLENDIL) 10 MG 24 hr tablet TAKE 1 TABLET (10 MG TOTAL) BY MOUTH DAILY. 03/04/14  Yes Leonie Man, MD  fish oil-omega-3 fatty acids 1000 MG capsule Take 1,200 mg by mouth daily.    Yes Historical Provider, MD  furosemide (LASIX) 40 MG tablet TAKE 1 TABLET BY MOUTH DAILY. MAKE APPOINTMENT FOR REFILLS 02/21/15  Yes Leonie Man, MD  gabapentin (NEURONTIN) 400 MG capsule Take 1 capsule (400 mg total) by mouth 3 (three) times daily. 01/20/15  Yes Wallene Huh, DPM  Garlic 017 MG CAPS Take 500 mg by mouth daily.   Yes Historical Provider, MD  insulin aspart (NOVOLOG) 100 UNIT/ML injection Inject 75 Units into the skin 2 (two) times daily. IN ADDITION 25 MG ONCE ADAY 04/29/11  Yes [name], MD  Insulin Detemir (LEVEMIR) 100 UNIT/ML Pen Inject into the skin 2 (two) times daily. 75 units twice a day   Yes Historical Provider, MD  levothyroxine (SYNTHROID, LEVOTHROID) 88 MCG tablet Take 88 mcg by mouth daily before breakfast.   Yes Historical Provider, MD  lisinopril (PRINIVIL,ZESTRIL) 20 MG tablet Take 1 tablet (20 mg total) by mouth daily. 12/17/14  Yes Shawnee Knapp, MD  Lutein 20 MG TABS Take by mouth.   Yes Historical Provider, MD  Misc Natural Products (LUTEIN 20 PO) Take 20 mg by mouth daily.   Yes Historical Provider, MD  Multiple Vitamins-Minerals (MULTIVITAMIN WITH MINERALS) tablet Take 1 tablet by mouth daily.   Yes Historical Provider, MD  NITROSTAT 0.4 MG SL tablet PLACE 1 TAB UNDER THE TONGUE EVERY 5 MINUTES AS NEEDED FOR CHEST PAIN (SEVERE PRESSURE OR TIGHTNESS) 03/24/14  Yes Leonie Man, MD  ondansetron (ZOFRAN ODT) 4 MG disintegrating tablet Take 1 tablet (4 mg total) by mouth every 4 (four) hours as needed for nausea or vomiting. 06/29/14  Yes Charlesetta Shanks, MD  OVER THE COUNTER MEDICATION Sea kelp 150 mcg once per dAY   Yes Historical Provider, MD  TURMERIC PO Take 800 mg by mouth daily.   Yes Historical Provider, MD  vitamin C (ASCORBIC ACID) 500 MG tablet Take 1,000 mg by mouth daily.   Yes Historical Provider, MD  metFORMIN (GLUCOPHAGE) 1000 MG tablet Take 1 tablet (1,000 mg total) by mouth 2 (two) times daily with a meal. Patient not taking: Reported on 03/07/2015 05/17/13   Isaiah Serge, NP     ROS: The patient denies fevers, chills, night sweats, unintentional weight loss, chest pain, palpitations, nausea, vomiting, abdominal pain, dysuria, hematuria, melena.   All other systems have been reviewed and were otherwise negative with the exception of those mentioned in the HPI and as above.    PHYSICAL EXAM: Filed Vitals:   03/07/15 1058  BP: 172/81  Pulse: 65  Temp: 97.6 F (36.4 C)  Resp: 16   Body mass index is 43.55 kg/(m^2).   General: Alert, no acute distress HEENT:  Normocephalic, atraumatic, oropharynx patent. Eye: [facility] Cardiovascular:  Regular rate and rhythm, no rubs murmurs or gallops.  No Carotid bruits, radial pulse intact. No pedal edema. Repeat blood pressure: 130/80. Respiratory: Clear to auscultation bilaterally.  No wheezes, rales, or rhonchi.  No cyanosis, no use of accessory musculature Abdominal: No organomegaly, abdomen is soft and non-tender, positive bowel sounds.  No masses. Musculoskeletal: Gait intact. No edema, tenderness Skin: No rashes. Neurologic: Facial musculature symmetric. Psychiatric: Patient acts appropriately throughout our interaction. Lymphatic: No cervical or submandibular lymphadenopathy    LABS: Results for orders placed or performed in visit on 03/07/15  POCT glucose (manual entry)  Result  Value Ref Range   POC Glucose <444 70 - 99 mg/dl  POCT glycosylated hemoglobin (Hb A1C)  Result Value Ref Range   Hemoglobin A1C 11.0      EKG/XRAY:   Primary read interpreted by Dr. Everlene Farrier at [facility].   ASSESSMENT/PLAN: I suspect patient is in denial about his diabetes and control of his diabetes. He is morbidly obese. He states he cannot tolerate metformin. He has serious comorbidities vascular disease of his carotids coronary disease as well as neuropathy. He stated he would call Dr. Suzette Battiest today and get an early appointment for help. A stat  bmet was done.I personally performed the services described in this documentation, which was scribed in my presence. The recorded information has been reviewed and is accurate.    Gross sideeffects, risk and benefits, and alternatives of medications d/w patient. Patient is aware that all medications have potential sideeffects and we are unable to predict every sideeffect or drug-drug interaction that may occur.  Arlyss Queen MD 03/07/2015 11:38 AM

## 2015-03-07 NOTE — Telephone Encounter (Signed)
Left message to return call. Labs faxed to Dr. Chalmers Cater

## 2015-03-07 NOTE — Telephone Encounter (Signed)
Harold [name] with [facility] called regarding alert high value. Glucose 488 repeated and verified.

## 2015-03-07 NOTE — Telephone Encounter (Signed)
Please call patient with the glucose results. Please copy and fax a result note to Dr. Chalmers Cater thank you

## 2015-03-08 ENCOUNTER — Telehealth: Payer: Self-pay

## 2015-03-08 NOTE — Telephone Encounter (Signed)
I returned pt's call. Informed him of glucose results.

## 2015-03-08 NOTE — Telephone Encounter (Signed)
Pt is returning call to [facility]

## 2015-03-09 ENCOUNTER — Other Ambulatory Visit: Payer: Self-pay | Admitting: Physician Assistant

## 2015-03-10 ENCOUNTER — Ambulatory Visit (HOSPITAL_COMMUNITY)
Admission: RE | Admit: 2015-03-10 | Discharge: 2015-03-10 | Disposition: A | Discharge status: Home / Self Care | Payer: No Typology Code available for payment source | Source: Ambulatory Visit | Attending: Family | Admitting: Family

## 2015-03-10 ENCOUNTER — Encounter: Payer: Self-pay | Admitting: Family

## 2015-03-10 ENCOUNTER — Ambulatory Visit (INDEPENDENT_AMBULATORY_CARE_PROVIDER_SITE_OTHER): Payer: No Typology Code available for payment source | Admitting: Family

## 2015-03-10 VITALS — BP 139/68 | HR 67 | Temp 97.3°F | Resp 18 | Ht 73.0 in | Wt 333.0 lb

## 2015-03-10 DIAGNOSIS — I6523 Occlusion and stenosis of bilateral carotid arteries: Secondary | ICD-10-CM | POA: Diagnosis not present

## 2015-03-10 DIAGNOSIS — E1122 Type 2 diabetes mellitus with diabetic chronic kidney disease: Secondary | ICD-10-CM | POA: Insufficient documentation

## 2015-03-10 DIAGNOSIS — I129 Hypertensive chronic kidney disease with stage 1 through stage 4 chronic kidney disease, or unspecified chronic kidney disease: Secondary | ICD-10-CM | POA: Insufficient documentation

## 2015-03-10 DIAGNOSIS — E785 Hyperlipidemia, unspecified: Secondary | ICD-10-CM | POA: Diagnosis not present

## 2015-03-10 DIAGNOSIS — I6529 Occlusion and stenosis of unspecified carotid artery: Secondary | ICD-10-CM | POA: Diagnosis not present

## 2015-03-10 DIAGNOSIS — E1151 Type 2 diabetes mellitus with diabetic peripheral angiopathy without gangrene: Secondary | ICD-10-CM

## 2015-03-10 DIAGNOSIS — Z48812 Encounter for surgical aftercare following surgery on the circulatory system: Secondary | ICD-10-CM | POA: Diagnosis not present

## 2015-03-10 DIAGNOSIS — N182 Chronic kidney disease, stage 2 (mild): Secondary | ICD-10-CM | POA: Insufficient documentation

## 2015-03-10 DIAGNOSIS — Z87891 Personal history of nicotine dependence: Secondary | ICD-10-CM

## 2015-03-10 NOTE — Progress Notes (Signed)
Chief Complaint: Extracranial Carotid Artery Stenosis   History of Present Illness  Harold [name] is a 60 y.o. male patient of Dr. Oneida Alar who presented for evaluation in Sept., 2013 of a moderate carotid stenosis as well as bilateral lower extremity pain. The patient previously had a right carotid endarterectomy in 2005 by Dr. Oneida Alar.  On 11/15/11 he underwent an aortogram with bilateral run-off by Dr. Oneida Alar, findings were mild diffuse tibial disease with no significant aortoiliac superficial femoral popliteal disease, no intervention necessary. He was counseled by Dr. Oneida Alar at that time re management of his DM and weight loss. He had 6 cardiac stents placed by Dr. Ellyn Hack in March, 2014, he denies having an MI.  He has had 4 lumbar spine surgeries, Dr. Marylyn Ishihara, [facility]. Pt has low back pain that radiates to below both knees when he walks, states his walking is limited by his back pain.  He denies non-healing wounds.  He denies history of stroke or TIA symptoms. Uses his C-PAP nightly for OSA. He had a right trigger finger surgery and right foot bunionectomy in 2015. He has left wrist tendonitis, received corticosteroid injections which helped, is wearing left wist splint.  Pt Diabetic: Yes, states not in good control, but is working closely with Dr. Chalmers Cater to improve this. Review of records: A1C on 03/07/15 was 11.0; states he sees Dr. Chalmers Cater March 27, 2015. Pt states he was told by his PCP that he is extremely insulin resistant.  Pt smoker: former smoker, quit in 2005  Pt meds include: Statin :Yes Betablocker: Yes ASA: no Other anticoagulants/antiplatelets: Brilinta for CAD    Past Medical History  Diagnosis Date  . Diabetes mellitus   . Carotid artery occlusion   . Complication of anesthesia   . PONV (postoperative nausea and vomiting)     "[place]"  . Hypothyroidism   . Depression     situaltional   . Neuropathy ([place])     notably improved following PCI with  improved cardiac function  . CAD S/P percutaneous coronary angioplasty 2003, 04/2012    status post PCI to LAD, circumflex-OM 2, RCA  . 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 Dyslipidemia associated with type 2 diabetes mellitus ([place])   . GERD (gastroesophageal reflux disease)   . Chronic renal insufficiency, stage II (mild)   . Pulmonary hypertension ([place]) 04/2012    PA pressure 55mhg  . HTN (hypertension)   . Chronic venous insufficiency     varicosities, no reflux; dopplers 04/14/12- valvular insufficiency in the R and L GSV  . Anginal pain ([facility] March 2015    Cardiac cath showed patent stents with distal LAD, circumflex-OM and RCA disease in small vessels.  . Obesity   . Sleep apnea     uses nightly  . Neuromuscular disorder ([place]     neuropathy in feet    Social History Social History  Substance Use Topics  . Smoking status: Former Smoker -- 1.00 packs/day for 42 years    Types: Cigarettes    Quit date: 03/23/2003  . Smokeless tobacco: Never Used  . Alcohol Use: No    Family History Family History  Problem Relation Age of Onset  . Adopted: Yes  . Family history unknown: Yes    Surgical History Past Surgical History  Procedure Laterality Date  . Umbilical hernia repair  2009    steel mesh  insert  . Coronary angioplasty  12/21/2001    PTCA of the distal and mid AV groove circ, unsuccessful PTCA of second OM total occlusion, unsuccessful PTCA of the apical LAD total occlusion  . Colonscopy    . Carotid endarterectomy  2005    Right; recent carotid Dopplers notes elevated velocities.   . Back surgery  2005 x1    X2-2010  . Lumbar laminectomy/decompression microdiscectomy  01/07/2012    Procedure: LUMBAR LAMINECTOMY/DECOMPRESSION MICRODISCECTOMY 1 LEVEL;  Surgeon: Winfield Cunas, MD;  Location: [place] [facility];  Service: Neurosurgery;  Laterality: Bilateral;   Lumbar Three-Four  Decompression  . Coronary stent placement  04/28/12    PCI to LAD (3x73m Xience DES postdilated to 3.25) and circ prox and mid (2 overlappinmg 2.528m12mmXience DES postdilated to 2.7534m . Cardiac catheterization  12/11/2001    significant 3V CAD, normal LV function  . Coronary angioplasty with stent placement  04/29/12    PCI to 3 RCA lesions, Promus Premiere 2.29m91mm distally, mid was 2.5mx226m and proximally 2.75x16mm,22m55-60%  . Doppler echocardiography  04/28/2012    poor quality study: EF estimated 60-65%; unable to assess diastolic function (previously noted to have diastolic dysfunction); severely dilated left atrium and mild right atrium; dilated IVC consistent with increased central venous pressure..  . Carotid doppler  04/27/2012    right internal carotid: Elevated velocities but no evidence of plaque. Left internal carotid 40-59%  . Abis  04/27/2012    mild bilateral arterial insufficiency  . Cardiac catheterization  March 2015    Moderate Pulm HTN: 46/16 - mean 33 mmHg; PCWP 22mmHg41[name], widely patent proximal and mid cervical extends with previous subtotal occlusion of smaller tube, I be patent RCA ostial mid and distal stents with 70% distal continuation RCA disease  . Bunionectomy Right 11/11/2013    Procedure: RIGHT FOOT SILVER BUNIONECTOMY;  Surgeon: John HeWylene SimmerLocation: [facility] CP[facility]ice: [facility];  Laterality: Right;  . Abdominal aortagram N/A 11/15/2011    Procedure: ABDOMINAL AORTAGRAM;  Surgeon: CharlesElam Dutch[facility];  Service: Cardiovascular;  [name];  . Left and right heart catheterization with coronary angiogram N/A 04/27/2012    Procedure: LEFT AND RIGHT HEART CATHETERIZATION WITH CORONARY ANGIOGRAM;  Surgeon: David WLeonie ManLocation[facility];  Service: Cardiovascular;  [name];  . Percutaneous coronary stent intervention  (pci-s) N/A 04/28/2012    Procedure: PERCUTANEOUS CORONARY STENT INTERVENTION (PCI-S);  Surgeon: Thomas Troy SineLocation[facility];  Service: Cardiovascular;  [name];  . Percutaneous coronary stent intervention (pci-s) N/A 04/29/2012    Procedure: PERCUTANEOUS CORONARY STENT INTERVENTION (PCI-S);  Surgeon: David WLeonie ManLocation[facility];  Service: Cardiovascular;  [name];  . Left and right heart catheterization with coronary angiogram N/A 05/14/2013    Procedure: LEFT AND RIGHT HEART CATHETERIZATION WITH CORONARY ANGIOGRAM;  Surgeon: Thomas Troy SineLocation[facility];  Service: Cardiovascular;  [name];    Allergies  Allergen Reactions  . Septra [Bactrim] Itching  . Penicillins Hives    Allergy to All cillin drugs  Ancef given 9/24 with no obvious reaction  . Clopidogrel     NON RESPONDER  P2Y12  . Metformin And Related   . Other     Walnuts, pecans     Current Outpatient Prescriptions  Medication Sig Dispense  Refill  . BRILINTA 90 MG TABS tablet TAKE 1 TABLET (90 MG TOTAL) BY MOUTH 2 (TWO) TIMES DAILY. 60 tablet 0  . carvedilol (COREG) 6.25 MG tablet TAKE 1 TABLET (6.25 MG TOTAL) BY MOUTH 2 (TWO) TIMES DAILY. 60 tablet 6  . cholecalciferol (VITAMIN D) 1000 UNITS tablet Take 1,000 Units by mouth 4 (four) times daily.     . Choline Fenofibrate (FENOFIBRIC ACID) 135 MG CPDR Take 1 capsule by mouth daily.  6  . escitalopram (LEXAPRO) 20 MG tablet TAKE 1 TABLET (20 MG TOTAL) BY MOUTH DAILY. 30 tablet 5  . esomeprazole (NEXIUM) 40 MG capsule TAKE 1 CAPSULE BY MOUTH DAILY BEFORE BREAKFAST. 90 capsule 0  . felodipine (PLENDIL) 10 MG 24 hr tablet TAKE 1 TABLET (10 MG TOTAL) BY MOUTH DAILY. 30 tablet 1  . fish oil-omega-3 fatty acids 1000 MG capsule Take 1,200 mg by mouth daily.     . furosemide (LASIX) 40 MG tablet TAKE 1 TABLET BY MOUTH DAILY. MAKE APPOINTMENT FOR REFILLS 30 tablet 1  . gabapentin (NEURONTIN) 400 MG capsule Take 1 capsule  (400 mg total) by mouth 3 (three) times daily. 90 capsule 5  . Garlic 161 MG CAPS Take 500 mg by mouth daily.    . insulin aspart (NOVOLOG) 100 UNIT/ML injection Inject 75 Units into the skin 2 (two) times daily. IN ADDITION 25 MG ONCE ADAY    . Insulin Detemir (LEVEMIR) 100 UNIT/ML Pen Inject into the skin 2 (two) times daily. 75 units twice a day    . levothyroxine (SYNTHROID, LEVOTHROID) 88 MCG tablet Take 88 mcg by mouth daily before breakfast.    . lisinopril (PRINIVIL,ZESTRIL) 20 MG tablet Take 1 tablet (20 mg total) by mouth daily. 30 tablet 2  . Lutein 20 MG TABS Take by mouth.    . metFORMIN (GLUCOPHAGE) 1000 MG tablet Take 1 tablet (1,000 mg total) by mouth 2 (two) times daily with a meal. (Patient not taking: Reported on 03/07/2015)    . Misc Natural Products (LUTEIN 20 PO) Take 20 mg by mouth daily.    . Multiple Vitamins-Minerals (MULTIVITAMIN WITH MINERALS) tablet Take 1 tablet by mouth daily.    Marland Kitchen NITROSTAT 0.4 MG SL tablet PLACE 1 TAB UNDER THE TONGUE EVERY 5 MINUTES AS NEEDED FOR CHEST PAIN (SEVERE PRESSURE OR TIGHTNESS) 25 tablet 3  . ondansetron (ZOFRAN ODT) 4 MG disintegrating tablet Take 1 tablet (4 mg total) by mouth every 4 (four) hours as needed for nausea or vomiting. 20 tablet 0  . OVER THE COUNTER MEDICATION Sea kelp 150 mcg once per dAY    . TURMERIC PO Take 800 mg by mouth daily.    . vitamin C (ASCORBIC ACID) 500 MG tablet Take 1,000 mg by mouth daily.    . [DISCONTINUED] insulin lispro (HUMALOG) 100 UNIT/ML injection Inject 25 Units into the skin 3 (three) times daily before meals.     No current facility-administered medications for this visit.    Review of Systems : See HPI for pertinent positives and negatives.  Physical Examination  Filed Vitals:   03/10/15 1350  BP: 139/68  Pulse: 67  Temp: 97.3 F (36.3 C)  Resp: 18  Height: _0  (1.854 m)  Weight: 333 lb (151.048 kg)  SpO2: 97%   Body mass index is 43.94 kg/(m^2).   General: Morbidly obese  male in NAD Gait: Normal HENT: WNL Eyes: Pupils equal and react to light accomodation. Pulmonary: normal non-labored breathing, no rales, rhonchi,or wheezing Cardiac:  RRR, no detected murmur  Abdomen: soft, NT, no palpable masses Skin: no rashes, no ulcers, no cellulitis  VASCULAR EXAM  Carotid Bruits Left Right   Negative Negative   Aorta is not palpable. Radial pulses are 2+ and =.   VASCULAR EXAM: Extremities without ischemic changes  without Gangrene; without open wounds.     LE Pulses LEFT RIGHT   POPLITEAL not palpable  not palpable   POSTERIOR TIBIAL  2+ palpable   2+ palpable    DORSALIS PEDIS  ANTERIOR TIBIAL 1+ palpable  1+ palpable    Musculoskeletal: no muscle wasting or atrophy; 1+ pretibial pitting and non pitting edema in BLE. Neurologic: A&O X 3; Appropriate Affect ;  SENSATION: normal; MOTOR FUNCTION: 5/5 Symmetric, CN 2-12 intact Speech is fluent/normal          Non-Invasive Vascular Imaging CAROTID DUPLEX 03/10/2015   Right ICA: 1 - 39 % stenosis, CEA site. Left ICA: 40 - 59 % stenosis. No significant change from 02/07/14 exam.   Assessment: Harold [name] is a 60 y.o. male who is s/p right CEA in 2005. He has no hx of stroke or TIA. Today's carotid duplex suggests minimal restenosis of right CEA site and 40-59% stenosis of left ICA.  His atherosclerotic risk factors include uncontrolled DM, morbid obesity, CAD, OSA, and former smoker.    Plan: Follow-up in 1 year with Carotid Duplex scan.   I discussed in depth with the patient the nature of atherosclerosis, and emphasized the importance of maximal medical management including strict control of blood pressure, blood glucose, and lipid levels, obtaining  regular exercise, and continued cessation of smoking.  The patient is aware that without maximal medical management the underlying atherosclerotic disease process will progress, limiting the benefit of any interventions. The patient was given information about stroke prevention and what symptoms should prompt the patient to seek immediate medical care. Thank you for allowing [place] to participate in this patient's care.  Clemon Chambers, RN, MSN, FNP-C [facility] Office: [phone]  Clinic Physician: Bridgett Larsson  03/10/2015 1:44 PM

## 2015-03-10 NOTE — Patient Instructions (Signed)
Stroke Prevention Some medical conditions and behaviors are associated with an increased chance of having a stroke. You may prevent a stroke by making healthy choices and managing medical conditions. HOW CAN I REDUCE MY RISK OF HAVING A STROKE?   Stay physically active. Get at least 30 minutes of activity on most or all days.  Do not smoke. It may also be helpful to avoid exposure to secondhand smoke.  Limit alcohol use. Moderate alcohol use is considered to be:  No more than 2 drinks per day for men.  No more than 1 drink per day for nonpregnant women.  Eat healthy foods. This involves:  Eating 5 or more servings of fruits and vegetables a day.  Making dietary changes that address high blood pressure (hypertension), high cholesterol, diabetes, or obesity.  Manage your cholesterol levels.  Making food choices that are high in fiber and low in saturated fat, trans fat, and cholesterol may control cholesterol levels.  Take any prescribed medicines to control cholesterol as directed by your health care provider.  Manage your diabetes.  Controlling your carbohydrate and sugar intake is recommended to manage diabetes.  Take any prescribed medicines to control diabetes as directed by your health care provider.  Control your hypertension.  Making food choices that are low in salt (sodium), saturated fat, trans fat, and cholesterol is recommended to manage hypertension.  Ask your health care provider if you need treatment to lower your blood pressure. Take any prescribed medicines to control hypertension as directed by your health care provider.  If you are 24-36 years of age, have your blood pressure checked every 3-5 years. If you are 17 years of age or older, have your blood pressure checked every year.  Maintain a healthy weight.  Reducing calorie intake and making food choices that are low in sodium, saturated fat, trans fat, and cholesterol are recommended to manage  weight.  Stop drug abuse.  Avoid taking birth control pills.  Talk to your health care provider about the risks of taking birth control pills if you are over 8 years old, smoke, get migraines, or have ever had a blood clot.  Get evaluated for sleep disorders (sleep apnea).  Talk to your health care provider about getting a sleep evaluation if you snore a lot or have excessive sleepiness.  Take medicines only as directed by your health care provider.  For some people, aspirin or blood thinners (anticoagulants) are helpful in reducing the risk of forming abnormal blood clots that can lead to stroke. If you have the irregular heart rhythm of atrial fibrillation, you should be on a blood thinner unless there is a good reason you cannot take them.  Understand all your medicine instructions.  Make sure that other conditions (such as anemia or atherosclerosis) are addressed. SEEK IMMEDIATE MEDICAL CARE IF:   You have sudden weakness or numbness of the face, arm, or leg, especially on one side of the body.  Your face or eyelid droops to one side.  You have sudden confusion.  You have trouble speaking (aphasia) or understanding.  You have sudden trouble seeing in one or both eyes.  You have sudden trouble walking.  You have dizziness.  You have a loss of balance or coordination.  You have a sudden, severe headache with no known cause.  You have new chest pain or an irregular heartbeat. Any of these symptoms may represent a serious problem that is an emergency. Do not wait to see if the symptoms will  go away. Get medical help at once. Call your local emergency services (911 in [place] Do not drive yourself to the hospital.   This information is not intended to replace advice given to you by your health care provider. Make sure you discuss any questions you have with your health care provider.   Document Released: 03/14/2004 Document Revised: 02/25/2014 Document Reviewed:  08/07/2012 Elsevier Interactive Patient Education 2016 Elsevier Inc.    Peripheral Vascular Disease Peripheral vascular disease (PVD) is a disease of the blood vessels that are not part of your heart and brain. A simple term for PVD is poor circulation. In most cases, PVD narrows the blood vessels that carry blood from your heart to the rest of your body. This can result in a decreased supply of blood to your arms, legs, and internal organs, like your stomach or kidneys. However, it most often affects a person's lower legs and feet. There are two types of PVD.  Organic PVD. This is the more common type. It is caused by damage to the structure of blood vessels.  Functional PVD. This is caused by conditions that make blood vessels contract and tighten (spasm). Without treatment, PVD tends to get worse over time. PVD can also lead to acute ischemic limb. This is when an arm or limb suddenly has trouble getting enough blood. This is a medical emergency. CAUSES Each type of PVD has many different causes. The most common cause of PVD is buildup of a fatty material (plaque) inside of your arteries (atherosclerosis). Small amounts of plaque can break off from the walls of the blood vessels and become lodged in a smaller artery. This blocks blood flow and can cause acute ischemic limb. Other common causes of PVD include:  Blood clots that form inside of blood vessels.  Injuries to blood vessels.  Diseases that cause inflammation of blood vessels or cause blood vessel spasms.  Health behaviors and health history that increase your risk of developing PVD. RISK FACTORS  You may have a greater risk of PVD if you:  Have a family history of PVD.  Have certain medical conditions, including:  High cholesterol.  Diabetes.  High blood pressure (hypertension).  Coronary heart disease.  Past problems with blood clots.  Past injury, such as burns or a broken bone. These may have damaged blood  vessels in your limbs.  Buerger disease. This is caused by inflamed blood vessels in your hands and feet.  Some forms of arthritis.  Rare birth defects that affect the arteries in your legs.  Use tobacco.  Do not get enough exercise.  Are obese.  Are age 62 or older. SIGNS AND SYMPTOMS  PVD may cause many different symptoms. Your symptoms depend on what part of your body is not getting enough blood. Some common signs and symptoms include:  Cramps in your lower legs. This may be a symptom of poor leg circulation (claudication).  Pain and weakness in your legs while you are physically active that goes away when you rest (intermittent claudication).  Leg pain when at rest.  Leg numbness, tingling, or weakness.  Coldness in a leg or foot, especially when compared with the other leg.  Skin or hair changes. These can include:  Hair loss.  Shiny skin.  Pale or bluish skin.  Thick toenails.  Inability to get or maintain an erection (erectile dysfunction). People with PVD are more prone to developing ulcers and sores on their toes, feet, or legs. These may take longer than  normal to heal. DIAGNOSIS Your health care provider may diagnose PVD from your signs and symptoms. The health care provider will also do a physical exam. You may have tests to find out what is causing your PVD and determine its severity. Tests may include:  Blood pressure recordings from your arms and legs and measurements of the strength of your pulses (pulse volume recordings).  Imaging studies using sound waves to take pictures of the blood flow through your blood vessels (Doppler ultrasound).  Injecting a dye into your blood vessels before having imaging studies using:  X-rays (angiogram or arteriogram).  Computer-generated X-rays (CT angiogram).  A powerful electromagnetic field and a computer (magnetic resonance angiogram or MRA). TREATMENT Treatment for PVD depends on the cause of your condition  and the severity of your symptoms. It also depends on your age. Underlying causes need to be treated and controlled. These include long-lasting (chronic) conditions, such as diabetes, high cholesterol, and high blood pressure. You may need to first try making lifestyle changes and taking medicines. Surgery may be needed if these do not work. Lifestyle changes may include:  Quitting smoking.  Exercising regularly.  Following a low-fat, low-cholesterol diet. Medicines may include:  Blood thinners to prevent blood clots.  Medicines to improve blood flow.  Medicines to improve your blood cholesterol levels. Surgical procedures may include:  A procedure that uses an inflated balloon to open a blocked artery and improve blood flow (angioplasty).  A procedure to put in a tube (stent) to keep a blocked artery open (stent implant).  Surgery to reroute blood flow around a blocked artery (peripheral bypass surgery).  Surgery to remove dead tissue from an infected wound on the affected limb.  Amputation. This is surgical removal of the affected limb. This may be necessary in cases of acute ischemic limb that are not improved through medical or surgical treatments. HOME CARE INSTRUCTIONS  Take medicines only as directed by your health care provider.  Do not use any tobacco products, including cigarettes, chewing tobacco, or electronic cigarettes. If you need help quitting, ask your health care provider.  Lose weight if you are overweight, and maintain a healthy weight as directed by your health care provider.  Eat a diet that is low in fat and cholesterol. If you need help, ask your health care provider.  Exercise regularly. Ask your health care provider to suggest some good activities for you.  Use compression stockings or other mechanical devices as directed by your health care provider.  Take good care of your feet.  Wear comfortable shoes that fit well.  Check your feet often for  any cuts or sores. SEEK MEDICAL CARE IF:  You have cramps in your legs while walking.  You have leg pain when you are at rest.  You have coldness in a leg or foot.  Your skin changes.  You have erectile dysfunction.  You have cuts or sores on your feet that are not healing. SEEK IMMEDIATE MEDICAL CARE IF:  Your arm or leg turns cold and blue.  Your arms or legs become red, warm, swollen, painful, or numb.  You have chest pain or trouble breathing.  You suddenly have weakness in your face, arm, or leg.  You become very confused or lose the ability to speak.  You suddenly have a very bad headache or lose your vision.   This information is not intended to replace advice given to you by your health care provider. Make sure you discuss any questions  you have with your health care provider.   Document Released: 03/14/2004 Document Revised: 02/25/2014 Document Reviewed: 07/15/2013 Elsevier Interactive Patient Education [employer].

## 2015-03-15 ENCOUNTER — Other Ambulatory Visit: Payer: Self-pay | Admitting: Cardiology

## 2015-03-15 NOTE — Telephone Encounter (Signed)
Rx request sent to pharmacy.

## 2015-03-16 ENCOUNTER — Other Ambulatory Visit: Payer: Self-pay | Admitting: Physician Assistant

## 2015-04-11 ENCOUNTER — Other Ambulatory Visit: Payer: Self-pay | Admitting: Cardiology

## 2015-04-11 NOTE — Telephone Encounter (Signed)
Rx request sent to pharmacy.  

## 2015-04-19 ENCOUNTER — Ambulatory Visit (INDEPENDENT_AMBULATORY_CARE_PROVIDER_SITE_OTHER): Payer: PRIVATE HEALTH INSURANCE | Admitting: Emergency Medicine

## 2015-04-19 VITALS — BP 138/68 | HR 67 | Temp 97.9°F | Resp 17 | Ht 73.5 in | Wt 333.0 lb

## 2015-04-19 DIAGNOSIS — R0981 Nasal congestion: Secondary | ICD-10-CM

## 2015-04-19 DIAGNOSIS — R110 Nausea: Secondary | ICD-10-CM

## 2015-04-19 DIAGNOSIS — R059 Cough, unspecified: Secondary | ICD-10-CM

## 2015-04-19 DIAGNOSIS — R05 Cough: Secondary | ICD-10-CM

## 2015-04-19 LAB — POCT INFLUENZA A/B
INFLUENZA B, POC: NEGATIVE
Influenza A, POC: NEGATIVE

## 2015-04-19 MED ORDER — AZITHROMYCIN 250 MG PO TABS: ORAL_TABLET | ORAL | Status: DC

## 2015-04-19 NOTE — Patient Instructions (Signed)
Sinusitis, Adult °Sinusitis is redness, soreness, and inflammation of the paranasal sinuses. Paranasal sinuses are air pockets within the bones of your face. They are located beneath your eyes, in the middle of your forehead, and above your eyes. In healthy paranasal sinuses, mucus is able to drain out, and air is able to circulate through them by way of your nose. However, when your paranasal sinuses are inflamed, mucus and air can become trapped. This can allow bacteria and other germs to grow and cause infection. °Sinusitis can develop quickly and last only a short time (acute) or continue over a long period (chronic). Sinusitis that lasts for more than 12 weeks is considered chronic. °CAUSES °Causes of sinusitis include: °· Allergies. °· Structural abnormalities, such as displacement of the cartilage that separates your nostrils (deviated septum), which can decrease the air flow through your nose and sinuses and affect sinus drainage. °· Functional abnormalities, such as when the small hairs (cilia) that line your sinuses and help remove mucus do not work properly or are not present. °SIGNS AND SYMPTOMS °Symptoms of acute and chronic sinusitis are the same. The primary symptoms are pain and pressure around the affected sinuses. Other symptoms include: °· Upper toothache. °· Earache. °· Headache. °· Bad breath. °· Decreased sense of smell and taste. °· A cough, which worsens when you are lying flat. °· Fatigue. °· Fever. °· Thick drainage from your nose, which often is green and may contain pus (purulent). °· Swelling and warmth over the affected sinuses. °DIAGNOSIS °Your health care provider will perform a physical exam. During your exam, your health care provider may perform any of the following to help determine if you have acute sinusitis or chronic sinusitis: °· Look in your nose for signs of abnormal growths in your nostrils (nasal polyps). °· Tap over the affected sinus to check for signs of  infection. °· View the inside of your sinuses using an imaging device that has a light attached (endoscope). °If your health care provider suspects that you have chronic sinusitis, one or more of the following tests may be recommended: °· Allergy tests. °· Nasal culture. A sample of mucus is taken from your nose, sent to a lab, and screened for bacteria. °· Nasal cytology. A sample of mucus is taken from your nose and examined by your health care provider to determine if your sinusitis is related to an allergy. °TREATMENT °Most cases of acute sinusitis are related to a viral infection and will resolve on their own within 10 days. Sometimes, medicines are prescribed to help relieve symptoms of both acute and chronic sinusitis. These may include pain medicines, decongestants, nasal steroid sprays, or saline sprays. °However, for sinusitis related to a bacterial infection, your health care provider will prescribe antibiotic medicines. These are medicines that will help kill the bacteria causing the infection. °Rarely, sinusitis is caused by a fungal infection. In these cases, your health care provider will prescribe antifungal medicine. °For some cases of chronic sinusitis, surgery is needed. Generally, these are cases in which sinusitis recurs more than 3 times per year, despite other treatments. °HOME CARE INSTRUCTIONS °· Drink plenty of water. Water helps thin the mucus so your sinuses can drain more easily. °· Use a humidifier. °· Inhale steam 3-4 times a day (for example, sit in the bathroom with the shower running). °· Apply a warm, moist washcloth to your face 3-4 times a day, or as directed by your health care provider. °· Use saline nasal sprays to help   moisten and clean your sinuses.  Take medicines only as directed by your health care provider.  If you were prescribed either an antibiotic or antifungal medicine, finish it all even if you start to feel better. SEEK IMMEDIATE MEDICAL CARE IF:  You have  increasing pain or severe headaches.  You have nausea, vomiting, or drowsiness.  You have swelling around your face.  You have vision problems.  You have a stiff neck.  You have difficulty breathing.   This information is not intended to replace advice given to you by your health care provider. Make sure you discuss any questions you have with your health care provider.   Document Released: 02/04/2005 Document Revised: 02/25/2014 Document Reviewed: 02/19/2011 Elsevier Interactive Patient Education [employer].

## 2015-04-19 NOTE — Progress Notes (Signed)
Subjective:  This chart was scribed for Nena Jordan MD, by Tamsen Roers, at [facility]  This patient was seen in room 10 and the patient's care was started at 10:26 AM.    Patient ID: Harold [name], male    DOB: 26-Jul-1955, 60 y.o.   MRN: [id number] Chief Complaint  Patient presents with  . Sinusitis  . Cough  . Dizziness    HPI  HPI Comments: Harold [name] is a 60 y.o. male who presents to the [facility] complaining of worsening sinus congestion (brown mucous), chills, and a non productive cough onset two days ago.  He has associated symptoms of nausea and a loss of appetite.  Denies body aches or vomiting. Patient would also like a note for work today.   Diabetes: Patient states that he is now in a diabetes study at Dr. Almetta Lovely office.  He also has a podiatrist now with whom he is happy with.   Patient Active Problem List   Diagnosis Date Noted  . Venous stasis of both lower extremities - with edema 02/25/2015  . Chest pain with low risk for cardiac etiology 05/08/2014  . Gastroesophageal reflux disease without esophagitis 10/10/2013  . Chronic diastolic heart failure, NYHA class 2 - PCWPf 22 mmHg during catheterization. 06/27/2013  . Dyspnea 06/17/2013  . COPD (chronic obstructive pulmonary disease) ([place]) 06/17/2013  . Restrictive lung disease 06/17/2013  . Myalgia and myositis 04/20/2013  . Aftercare following surgery of the circulatory system, [place] 02/22/2013  . Obesity, Class III, BMI 40-49.9 (morbid obesity) ([place]) 09/01/2012  . Fatigue 09/01/2012  . Preoperative cardiovascular examination 09/01/2012  . Chronic renal insufficiency, stage II (mild) 04/30/2012  . OSA on CPAP 04/29/2012  . Pulmonary hypertension, 50mhg 04/29/2012  . CAD, LAD/CFX DES 04/28/12- staged RCA DES 04/29/12 after pt declined CABG, cath 05/14/13 stable CAD medical therapy   . Occlusion and stenosis of carotid artery without mention of cerebral  infarction 11/07/2011  . Diabetes mellitus type 2 with neurological manifestations ([place] 04/29/2011  . Essential hypertension 04/29/2011  . Neuropathy ([place] 04/29/2011  . 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 Depression 04/29/2011   Past Medical History  Diagnosis Date  . Diabetes mellitus   . Carotid artery occlusion   . Complication of anesthesia   . PONV (postoperative nausea and vomiting)     "[place]"  . Hypothyroidism   . Depression     situaltional   . Neuropathy ([place]     notably improved following PCI with improved cardiac function  . CAD S/P percutaneous coronary angioplasty 2003, 04/2012    status post PCI to LAD, circumflex-OM 2, RCA  . 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 Dyslipidemia associated with type 2 diabetes mellitus ([place]   . GERD (gastroesophageal reflux disease)   . Chronic renal insufficiency, stage II (mild)   . Pulmonary hypertension ([place] 04/2012    PA pressure 569mg  . HTN (hypertension)   . Chronic venous insufficiency     varicosities, no reflux; dopplers 04/14/12- valvular insufficiency in the R and L GSV  . Anginal [facility] ([facility]    Cardiac cath showed patent stents with distal LAD, circumflex-OM and RCA disease in small vessels.  . Obesity   . Sleep apnea     uses nightly  .  Neuromuscular disorder ([place])     neuropathy in feet   Past Surgical History  Procedure Laterality Date  . Umbilical hernia repair  2009    steel mesh insert  . Coronary angioplasty  12/21/2001    PTCA of the distal and mid AV groove circ, unsuccessful PTCA of second OM total occlusion, unsuccessful PTCA of the apical LAD total occlusion  . Colonscopy    . Carotid endarterectomy  2005    Right; recent carotid Dopplers notes elevated velocities.   . Back surgery  2005 x1    X2-2010  . Lumbar  laminectomy/decompression microdiscectomy  01/07/2012    Procedure: LUMBAR LAMINECTOMY/DECOMPRESSION MICRODISCECTOMY 1 LEVEL;  Surgeon: Winfield Cunas, MD;  Location: [facility];  Service: Neurosurgery;  Laterality: Bilateral;   Lumbar Three-Four Decompression  . Coronary stent placement  04/28/12    PCI to LAD (3x92m Xience DES postdilated to 3.25) and circ prox and mid (2 overlappinmg 2.532m12mmXience DES postdilated to 2.7538m . Cardiac catheterization  12/11/2001    significant 3V CAD, normal LV function  . Coronary angioplasty with stent placement  04/29/12    PCI to 3 RCA lesions, Promus Premiere 2.42m75mm distally, mid was 2.5mx263m and proximally 2.75x16mm,42m55-60%  . Doppler echocardiography  04/28/2012    poor quality study: EF estimated 60-65%; unable to assess diastolic function (previously noted to have diastolic dysfunction); severely dilated left atrium and mild right atrium; dilated IVC consistent with increased central venous pressure..  . Carotid doppler  04/27/2012    right internal carotid: Elevated velocities but no evidence of plaque. Left internal carotid 40-59%  . Abis  04/27/2012    mild bilateral arterial insufficiency  . Cardiac catheterization  March 2015    Moderate Pulm HTN: 46/16 - mean 33 mmHg; PCWP 22mmHg57[name], widely patent proximal and mid cervical extends with previous subtotal occlusion of smaller tube, I be patent RCA ostial mid and distal stents with 70% distal continuation RCA disease  . Bunionectomy Right 11/11/2013    Procedure: RIGHT FOOT SILVER BUNIONECTOMY;  Surgeon: John HeWylene SimmerLocation: [facility] [place];  Laterality: Right;  . Abdominal aortagram N/A 11/15/2011    Procedure: ABDOMINAL AORTAGRAM;  Surgeon: CharlesElam DutchLocation: [facility];  Service: Cardiovascular;  [name];  . Left and right heart catheterization with coronary  angiogram N/A 04/27/2012    Procedure: LEFT AND RIGHT HEART CATHETERIZATION WITH CORONARY ANGIOGRAM;  Surgeon: David WLeonie ManLocation: [facility];  Service: Cardiovascular;  [name];  . Percutaneous coronary stent intervention (pci-s) N/A 04/28/2012    Procedure: PERCUTANEOUS CORONARY STENT INTERVENTION (PCI-S);  Surgeon: Thomas Troy SineLocation: [facility];  Service: Cardiovascular;  [name];  . Percutaneous coronary stent intervention (pci-s) N/A 04/29/2012    Procedure: PERCUTANEOUS CORONARY STENT INTERVENTION (PCI-S);  Surgeon: David WLeonie ManLocation: [facility];  Service: Cardiovascular;  [name];  . Left and right heart catheterization with coronary angiogram N/A 05/14/2013    Procedure: LEFT AND RIGHT HEART CATHETERIZATION WITH CORONARY ANGIOGRAM;  Surgeon: Thomas Troy SineLocation: [facility];  Service: Cardiovascular;  [name];   Allergies  Allergen Reactions  . Septra [Bactrim] Itching  . Penicillins Hives    Allergy to All cillin drugs  Ancef given 9/24 with no obvious reaction  . Clopidogrel  NON RESPONDER  P2Y12  . Metformin And Related   . Other     Walnuts, pecans    Prior to Admission medications   Medication Sig Start Date End Date Taking? Authorizing Provider  BRILINTA 90 MG TABS tablet TAKE 1 TABLET (90 MG TOTAL) BY MOUTH 2 (TWO) TIMES DAILY. 03/15/15  Yes Leonie Man, MD  carvedilol (COREG) 6.25 MG tablet TAKE 1 TABLET (6.25 MG TOTAL) BY MOUTH 2 (TWO) TIMES DAILY. 12/06/14  Yes Leonie Man, MD  cholecalciferol (VITAMIN D) 1000 UNITS tablet Take 1,000 Units by mouth 4 (four) times daily.    Yes Historical Provider, MD  Choline Fenofibrate (FENOFIBRIC ACID) 135 MG CPDR Take 1 capsule by mouth daily. 01/06/14  Yes Historical Provider, MD  escitalopram (LEXAPRO) 20 MG tablet TAKE 1 TABLET (20 MG TOTAL) BY MOUTH DAILY. 03/09/15  Yes [name], MD  esomeprazole (NEXIUM) 40 MG capsule TAKE 1 CAPSULE BY  MOUTH DAILY BEFORE BREAKFAST. 01/16/15  Yes Leonie Man, MD  felodipine (PLENDIL) 10 MG 24 hr tablet TAKE 1 TABLET (10 MG TOTAL) BY MOUTH DAILY. 03/04/14  Yes Leonie Man, MD  fish oil-omega-3 fatty acids 1000 MG capsule Take 1,200 mg by mouth daily.    Yes Historical Provider, MD  furosemide (LASIX) 40 MG tablet Take 1 tablet (40 mg total) by mouth daily. 04/11/15  Yes Leonie Man, MD  gabapentin (NEURONTIN) 400 MG capsule Take 1 capsule (400 mg total) by mouth 3 (three) times daily. 01/20/15  Yes Wallene Huh, DPM  Garlic 657 MG CAPS Take 500 mg by mouth daily.   Yes Historical Provider, MD  insulin aspart (NOVOLOG) 100 UNIT/ML injection Inject 75 Units into the skin 2 (two) times daily. IN ADDITION 25 MG ONCE ADAY 04/29/11  Yes [name], MD  Insulin Detemir (LEVEMIR) 100 UNIT/ML Pen Inject into the skin 2 (two) times daily. 75 units twice a day   Yes Historical Provider, MD  levothyroxine (SYNTHROID, LEVOTHROID) 88 MCG tablet Take 88 mcg by mouth daily before breakfast.   Yes Historical Provider, MD  lisinopril (PRINIVIL,ZESTRIL) 20 MG tablet Take 1 tablet (20 mg total) by mouth daily. 12/17/14  Yes Shawnee Knapp, MD  Lutein 20 MG TABS Take by mouth.   Yes Historical Provider, MD  Misc Natural Products (LUTEIN 20 PO) Take 20 mg by mouth daily.   Yes Historical Provider, MD  Multiple Vitamins-Minerals (MULTIVITAMIN WITH MINERALS) tablet Take 1 tablet by mouth daily.   Yes Historical Provider, MD  NITROSTAT 0.4 MG SL tablet PLACE 1 TAB UNDER THE TONGUE EVERY 5 MINUTES AS NEEDED FOR CHEST PAIN (SEVERE PRESSURE OR TIGHTNESS) 03/24/14  Yes Leonie Man, MD  ondansetron (ZOFRAN ODT) 4 MG disintegrating tablet Take 1 tablet (4 mg total) by mouth every 4 (four) hours as needed for nausea or vomiting. 06/29/14  Yes Charlesetta Shanks, MD  OVER THE COUNTER MEDICATION Sea kelp 150 mcg once per dAY   Yes Historical Provider, MD  TURMERIC PO Take 800 mg by mouth daily.   Yes Historical Provider, MD    vitamin C (ASCORBIC ACID) 500 MG tablet Take 1,000 mg by mouth daily.   Yes Historical Provider, MD  metFORMIN (GLUCOPHAGE) 1000 MG tablet Take 1 tablet (1,000 mg total) by mouth 2 (two) times daily with a meal. Patient not taking: Reported on 04/19/2015 05/17/13   Isaiah Serge, NP   Social History   Social History  . Marital Status: Single  Spouse Name: N/A  . Number of Children: 0  . Years of Education: hs   Occupational History  . [employer]   .     Social History Main Topics  . Smoking status: Former Smoker -- 1.00 packs/day for 42 years    Types: Cigarettes    Quit date: 03/23/2003  . Smokeless tobacco: Never Used  . Alcohol Use: No  . Drug Use: No  . Sexual Activity: Not on file   Other Topics Concern  . Not on file   Social History Narrative   He and his partner [name] have now officially gotten married on 05/26/2013.  [name] is also a patient of our clinic.  He is now initially hyphenated his last name.   He works at [employer].   He does not smoke, he quit in 2009.  He does not drink alcohol.   He was adopted and no family history is known.     Review of Systems  Constitutional: Positive for chills. Negative for fever.  HENT: Positive for congestion and sinus pressure.   Eyes: Negative for pain, redness and itching.  Respiratory: Positive for cough.   Gastrointestinal: Positive for nausea. Negative for vomiting.  Neurological: Positive for dizziness. Negative for seizures, syncope, speech difficulty and weakness.       Objective:   Physical Exam  Filed Vitals:   04/19/15 0925  BP: 138/68  Pulse: 67  Temp: 97.9 F (36.6 C)  TempSrc: Oral  Resp: 17  Height: 6' 1.5" (1.867 m)  Weight: 333 lb (151.048 kg)  SpO2: 97%    HEAD: Normocephalic/atraumatic EYES: EOMI/PERRL, He has puffiness around both eyes ENMT: Mucous membranes moist, Ears are normal, Nasal congestion, Throat normal  NECK: supple no meningeal  signs SPINE/BACK:entire spine nontender CV: S1/S2 noted, no murmurs/rubs/gallops noted LUNGS: Lungs are clear to auscultation bilaterally, no apparent distress ABDOMEN: soft, nontender, no rebound or guarding, bowel sounds noted throughout abdomen GU:no cva tenderness NEURO: Pt is awake/alert/appropriate, moves all extremitiesx4.  No facial droop.   EXTREMITIES: pulses normal/equal, full ROM SKIN: warm, color normal PSYCH: no abnormalities of mood noted, alert and oriented to situation    Results for orders placed or performed in visit on 04/19/15  POCT Influenza A/B  Result Value Ref Range   Influenza A, POC Negative Negative   Influenza B, POC Negative Negative       Assessment & Plan:  We'll treat with a Z-Pak. Flu test negative. He was given a note for work.I personally performed the services described in this documentation, which was scribed in my presence. The recorded information has been reviewed and is accurate.  [name], MD

## 2015-04-30 ENCOUNTER — Other Ambulatory Visit: Payer: Self-pay | Admitting: Cardiology

## 2015-05-01 NOTE — Telephone Encounter (Signed)
REFILL

## 2015-05-13 ENCOUNTER — Other Ambulatory Visit: Payer: Self-pay | Admitting: Cardiology

## 2015-05-17 ENCOUNTER — Telehealth: Payer: Self-pay

## 2015-05-17 NOTE — Telephone Encounter (Signed)
Patient needs FMLA forms completed and updated by Dr Everlene Farrier. I have completed all the needed info all that needs to be done is to have it signed. I will place in your box on 05/17/15. Please sign and return to the FMLA/Disability box at the [address] checkout desk within 5-7 business days. Thank you!

## 2015-05-18 NOTE — Telephone Encounter (Signed)
This has been completed.

## 2015-05-18 NOTE — Telephone Encounter (Signed)
Paperwork completed on 05/18/15 I called the patient to come and pick them up at [employer].

## 2015-05-30 DIAGNOSIS — Z0271 Encounter for disability determination: Secondary | ICD-10-CM

## 2015-06-01 ENCOUNTER — Ambulatory Visit (INDEPENDENT_AMBULATORY_CARE_PROVIDER_SITE_OTHER): Payer: No Typology Code available for payment source | Admitting: Cardiology

## 2015-06-01 ENCOUNTER — Encounter: Payer: Self-pay | Admitting: Cardiology

## 2015-06-01 VITALS — BP 156/74 | HR 63 | Ht 73.0 in | Wt 338.0 lb

## 2015-06-01 DIAGNOSIS — I10 Essential (primary) hypertension: Secondary | ICD-10-CM

## 2015-06-01 DIAGNOSIS — E782 Mixed hyperlipidemia: Secondary | ICD-10-CM

## 2015-06-01 DIAGNOSIS — I878 Other specified disorders of veins: Secondary | ICD-10-CM

## 2015-06-01 DIAGNOSIS — I272 Other secondary pulmonary hypertension: Secondary | ICD-10-CM

## 2015-06-01 DIAGNOSIS — Z9861 Coronary angioplasty status: Secondary | ICD-10-CM

## 2015-06-01 DIAGNOSIS — E1169 Type 2 diabetes mellitus with other specified complication: Secondary | ICD-10-CM | POA: Diagnosis not present

## 2015-06-01 DIAGNOSIS — I2510 Atherosclerotic heart disease of native coronary artery without angina pectoris: Secondary | ICD-10-CM

## 2015-06-01 DIAGNOSIS — I5032 Chronic diastolic (congestive) heart failure: Secondary | ICD-10-CM

## 2015-06-01 NOTE — Progress Notes (Signed)
PCP: [name], MD  Podiatrist: Dr. [facility]  Clinic Note: Chief Complaint  Patient presents with  . 3 MONTH FOLLOW UP    CAD- PCI, DHF  . Edema    IN FEET AT NIGHT    HPI: Harold [name] is a 60 y.o. male with a PMH below who presents today for 3 MONTH f/u of CAD (multivessel PCI), DHF with 2nry Pulm HTN & persistent edema.Harold [name] was last seen on Jan 5th 2017.  Recent Hospitalizations: none Studies Reviewed: none  Interval History: Acutally doing pretty well - with exception of lack of Energy. Is currently in a DM study with Elliot Gault (Pharm-D). Walking 3 x ~ 10 min day.  Back working.  NO Sx consistent with prior Angina.  Still has DOE - but is starting to get better with increased exercise now & legs not hurting as much with new Med.  Back still hurts, but better over last few weeks. Edema is better & stable - no more leg lesions.  Taking a medication for PN This medicine was prescribed by his podiatrist. He says that the neuropathy is deathly feeling much better. He says he feels so happy his legs feel better. The swelling is much improved. Even the discoloration is changed some. Is not having nearly the same discomfort in the legs. He is says the swelling has really been controlled. He is very infrequent and use extra doses of Lasix. He is actually now starting to try to do more in the size. He is walking further. In general feeling better about himself. He is also trying to watch his diet and having adjusted his diet yet again. GERD symptoms have been totally stabilized with the dose of Nexium.  No further chest discomfort episodes. Some episodes of fast HR with exertion - but he thinks that this is to be expected. A couple of episodes of L arm pain.  No PND, orthopnea & stable / improved edema. No palpitations, lightheadedness, dizziness, weakness or syncope/near syncope. No TIA/amaurosis fugax symptoms. No claudication.  ROS: A comprehensive was  performed. Review of Systems  Constitutional: Positive for malaise/fatigue. Negative for weight loss.  HENT: Negative for congestion and nosebleeds.   Eyes: Positive for photophobia. Negative for blurred vision, double vision and redness.  Respiratory: Positive for shortness of breath (with exertion). Negative for cough.   Cardiovascular: Positive for leg swelling (stable). Negative for claudication.  Gastrointestinal: Positive for heartburn (stable - not an issue). Negative for blood in stool and melena.  Genitourinary: Negative for hematuria.  Musculoskeletal: Positive for back pain (improving).  Neurological: Negative for dizziness, focal weakness and headaches.       PN improving  Endo/Heme/Allergies: Does not bruise/bleed easily.  All other systems reviewed and are negative.    Past Medical History  Diagnosis Date  . Diabetes mellitus   . Carotid artery occlusion   . Complication of anesthesia   . PONV (postoperative nausea and vomiting)     "[place]"  . Hypothyroidism   . Depression     situaltional   . Neuropathy ([place])     notably improved following PCI with improved cardiac function  . CAD S/P percutaneous coronary angioplasty 2003, 04/2012    status post PCI to LAD, circumflex-OM 2, RCA  . 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 Dyslipidemia associated with type 2 diabetes  mellitus ([place])   . GERD (gastroesophageal reflux disease)   . Chronic renal insufficiency, stage II (mild)   . Pulmonary hypertension ([place]) 04/2012    PA pressure 385mhg  . HTN (hypertension)   . Chronic venous insufficiency     varicosities, no reflux; dopplers 04/14/12- valvular insufficiency in the R and L GSV  . Anginal pain ([facility] March 2015    Cardiac cath showed patent stents with distal LAD, circumflex-OM and RCA disease in small vessels.  . Obesity   . Sleep apnea     uses nightly  .  Neuromuscular disorder ([place]     neuropathy in feet    Past Surgical History  Procedure Laterality Date  . Umbilical hernia repair  2009    steel mesh insert  . Coronary angioplasty  12/21/2001    PTCA of the distal and mid AV groove circ, unsuccessful PTCA of second OM total occlusion, unsuccessful PTCA of the apical LAD total occlusion  . Colonscopy    . Carotid endarterectomy  2005    Right; recent carotid Dopplers notes elevated velocities.   . Back surgery  2005 x1    X2-2010  . Lumbar laminectomy/decompression microdiscectomy  01/07/2012    Procedure: LUMBAR LAMINECTOMY/DECOMPRESSION MICRODISCECTOMY 1 LEVEL;  Surgeon: KWinfield Cunas MD;  [facility];  Service: Neurosurgery;  Laterality: Bilateral;   Lumbar Three-Four Decompression  . Coronary stent placement  04/28/12    PCI to LAD (3x268mXience DES postdilated to 3.25) and circ prox and mid (2 overlappinmg 2.85m8m2mmXience DES postdilated to 2.785m13m. Cardiac catheterization  12/11/2001    significant 3V CAD, normal LV function  . Coronary angioplasty with stent placement  04/29/12    PCI to 3 RCA lesions, Promus Premiere 2.285mm30m distally, mid was 2.85mx2832mand proximally 2.75x16mm, 69m5-60%  . Doppler echocardiography  04/28/2012    poor quality study: EF estimated 60-65%; unable to assess diastolic function (previously noted to have diastolic dysfunction); severely dilated left atrium and mild right atrium; dilated IVC consistent with increased central venous pressure..  . Carotid doppler  04/27/2012    right internal carotid: Elevated velocities but no evidence of plaque. Left internal carotid 40-59%  . Abis  04/27/2012    mild bilateral arterial insufficiency  . Cardiac catheterization  March 2015    Moderate Pulm HTN: 46/16 - mean 33 mmHg; PCWP 22mmHg;67mltivessel CAD with widely patent mid LAD stents and 90% apical LAD, widely patent proximal and mid cervical extends with previous subtotal occlusion of smaller  tube, I be patent RCA ostial mid and distal stents with 70% distal continuation RCA disease  . Bunionectomy Right 11/11/2013    Procedure: RIGHT FOOT SILVER BUNIONECTOMY;  Surgeon: John HewWylene Simmerocation: [facility];  Laterality: Right;  . Abdominal aortagram N/A 11/15/2011    Procedure: ABDOMINAL AORTAGRAM;  Surgeon: Charles Elam Dutchocation: [facility];  Service: Cardiovascular;  [name];  . Left and right heart catheterization with coronary angiogram N/A 04/27/2012    Procedure: LEFT AND RIGHT HEART CATHETERIZATION WITH CORONARY ANGIOGRAM;  Surgeon: David W Leonie Manocation: [facility];  Service: Cardiovascular;  [name];  . Percutaneous coronary stent intervention (pci-s) N/A 04/28/2012    Procedure: PERCUTANEOUS CORONARY STENT INTERVENTION (PCI-S);  Surgeon: Thomas ATroy Sineocation: [facility];  Service: Cardiovascular;  [name];  . Percutaneous coronary stent intervention (pci-s) N/A 04/29/2012    Procedure: PERCUTANEOUS  CORONARY STENT INTERVENTION (PCI-S);  Surgeon: Leonie Man, MD;  Location: [facility] C[facility] LAB;  Service: Cardiovascular;  [name];  . Left and right heart catheterization with coronary angiogram N/A 05/14/2013    Procedure: LEFT AND RIGHT HEART CATHETERIZATION WITH CORONARY ANGIOGRAM;  Surgeon: Troy Sine, MD;  Location: [facility] C[facility];  Service: Cardiovascular;  [name];   Prior to Admission medications   Medication Sig Start Date End Date Taking? Authorizing Provider  azithromycin (ZITHROMAX) 250 MG tablet Take 2 today then one a day for 4 days 04/19/15  Yes [name], MD  BRILINTA 90 MG TABS tablet TAKE 1 TABLET (90 MG TOTAL) BY MOUTH 2 (TWO) TIMES DAILY. 03/15/15  Yes Leonie Man, MD  carvedilol (COREG) 6.25 MG tablet TAKE 1 TABLET (6.25 MG TOTAL) BY MOUTH 2 (TWO) TIMES DAILY. 12/06/14  Yes Leonie Man, MD  cholecalciferol (VITAMIN D) 1000 UNITS tablet Take 1,000  Units by mouth 4 (four) times daily.    Yes Historical Provider, MD  Choline Fenofibrate (FENOFIBRIC ACID) 135 MG CPDR Take 1 capsule by mouth daily. 01/06/14  Yes Historical Provider, MD  escitalopram (LEXAPRO) 20 MG tablet TAKE 1 TABLET (20 MG TOTAL) BY MOUTH DAILY. 03/09/15  Yes [name], MD  esomeprazole (NEXIUM) 40 MG capsule TAKE 1 CAPSULE BY MOUTH DAILY BEFORE BREAKFAST. 05/01/15  Yes Leonie Man, MD  felodipine (PLENDIL) 10 MG 24 hr tablet TAKE 1 TABLET BY MOUTH DAILY. 05/15/15  Yes Leonie Man, MD  fish oil-omega-3 fatty acids 1000 MG capsule Take 1,200 mg by mouth daily.    Yes Historical Provider, MD  furosemide (LASIX) 40 MG tablet Take 1 tablet (40 mg total) by mouth daily. 04/11/15  Yes Leonie Man, MD  Garlic 858 MG CAPS Take 500 mg by mouth daily.   Yes Historical Provider, MD  insulin aspart (NOVOLOG) 100 UNIT/ML injection Inject 75 Units into the skin 2 (two) times daily. IN ADDITION 25 MG ONCE ADAY 04/29/11  Yes [name], MD  Insulin Detemir (LEVEMIR) 100 UNIT/ML Pen Inject into the skin 2 (two) times daily. 75 units twice a day   Yes Historical Provider, MD  levothyroxine (SYNTHROID, LEVOTHROID) 88 MCG tablet Take 88 mcg by mouth daily before breakfast.   Yes Historical Provider, MD  lisinopril (PRINIVIL,ZESTRIL) 20 MG tablet Take 1 tablet (20 mg total) by mouth daily. 12/17/14  Yes Shawnee Knapp, MD  Lutein 20 MG TABS Take by mouth.   Yes Historical Provider, MD  Misc Natural Products (LUTEIN 20 PO) Take 20 mg by mouth daily.   Yes Historical Provider, MD  Multiple Vitamins-Minerals (MULTIVITAMIN WITH MINERALS) tablet Take 1 tablet by mouth daily.   Yes Historical Provider, MD  NITROSTAT 0.4 MG SL tablet PLACE 1 TAB UNDER THE TONGUE EVERY 5 MINUTES AS NEEDED FOR CHEST PAIN (SEVERE PRESSURE OR TIGHTNESS) 03/24/14  Yes Leonie Man, MD  ondansetron (ZOFRAN ODT) 4 MG disintegrating tablet Take 1 tablet (4 mg total) by mouth every 4 (four) hours as needed for nausea or  vomiting. 06/29/14  Yes Charlesetta Shanks, MD  OVER THE COUNTER MEDICATION Sea kelp 150 mcg once per dAY   Yes Historical Provider, MD  TURMERIC PO Take 800 mg by mouth daily.   Yes Historical Provider, MD  vitamin C (ASCORBIC ACID) 500 MG tablet Take 1,000 mg by mouth daily.   Yes Historical Provider, MD  metFORMIN (GLUCOPHAGE) 1000 MG tablet Take 1 tablet (1,000 mg total)  by mouth 2 (two) times daily with a meal. Patient not taking: Reported on 04/19/2015 05/17/13   Isaiah Serge, NP   Allergies  Allergen Reactions  . Septra [Bactrim] Itching  . Penicillins Hives    Allergy to All cillin drugs  Ancef given 9/24 with no obvious reaction  . Clopidogrel     NON RESPONDER  P2Y12  . Metformin And Related   . Other     Walnuts, pecans     Social History   Social History  . Marital Status: Single    Spouse Name: N/A  . Number of Children: 0  . Years of Education: hs   Occupational History  . [employer]   .     Social History Main Topics  . Smoking status: Former Smoker -- 1.00 packs/day for 42 years    Types: Cigarettes    Quit date: 03/23/2003  . Smokeless tobacco: Never Used  . Alcohol Use: No  . Drug Use: No  . Sexual Activity: Not Asked   Other Topics Concern  . None   Social History Narrative   He and his partner [name] have now officially gotten married on 05/26/2013.  [name] is also a patient of our clinic.  He is now initially hyphenated his last name.   He works at [employer].   He does not smoke, he quit in 2009.  He does not drink alcohol.   He was adopted and no family history is known.    Family History  Problem Relation Age of Onset  . Adopted: Yes  . Family history unknown: Yes    Wt Readings from Last 3 Encounters:  06/01/15 338 lb (153.316 kg)  04/19/15 333 lb (151.048 kg)  03/10/15 333 lb (151.048 kg)    PHYSICAL EXAM BP 156/74 mmHg  Pulse 63  Ht _0  (1.854 m)  Wt 338 lb (153.316 kg)  BMI 44.60 kg/m2 General appearance:  alert, cooperative, appears stated age, no distress and morbidly obese; Normal Mood & Affect Neck: no adenopathy, no carotid bruit and no JVD Lungs: clear to auscultation bilaterally, normal percussion bilaterally and non-labored Heart: regular rate and rhythm, distant but normal S1& S2, no murmur, click, rub or gallop; unable to palpate PMI Abdomen: soft, non-tender; bowel sounds normal; no masses, no organomegaly; Cannot palpate for HSM due to obesity Extremities: extremities normal, atraumatic, no cyanosis, and edema 2+ to knees with venous stasis changes Pulses: 2+ and symmetric B UE - cannot palpate LE pulses due to edema Skin: mobility and turgor normal; BLE venous stasis changes - notably less significant. Edema notably improved. Skin has normal texture with no lesions. Neurologic: Mental status: Alert, oriented, thought content appropriate Cranial nerves: normal (II-XII grossly intact)    Adult ECG Report NSR, rate 63. Normal axis, intervals and durations.  Normal/stable EKG mild repolarization abnormalities noted in the anteroseptal leads. These are chronic.   Other studies Reviewed: Additional studies/ records that were reviewed today include:  Recent Labs:   Lab Results  Component Value Date   CHOL 247* 08/30/2014   HDL 41 08/30/2014   LDLCALC NOT CALC 08/30/2014   TRIG 559* 08/30/2014   CHOLHDL 6.0 08/30/2014    ASSESSMENT / PLAN: Problem List Items Addressed This Visit    Venous stasis of both lower extremities - with edema (Chronic)    Significant improvement. Now he is able to wear his compression stockings but not that frequently. He says that the swelling is much improved. He is  seeing a new podiatrist and is very happy with the medicine is off for the neuropathy. In general this is the best I've seen his feet and legs looking in a long time.      Pulmonary hypertension, 66mhg (Chronic)    Exertional dyspnea is notably improved. Overall volume status seems to be  improved. I would not be surprised if we were to find his pulmonary pressures are slightly lower now. Needs to continue weight loss efforts and CPAP. He should not be fearful of using additional dose of Lasix as necessary. Do need to monitor his blood pressure to ensure that they do not increase to much, or to maximize afterload reduction.      Obesity, Class III, BMI 40-49.9 (morbid obesity) [place] ([place])    His weight seemed to be up a little bit from January, but he is now in the process of making a very conscious effort to adjust his diet, and trying to get back into doing something of exercise finally. Now that his neuropathy pain is improving he can hopefully get back to doing exercise      Essential hypertension (Chronic)    Blood pressure is low but high today. He was a little rushed coming in. This is not usually a problem for him. Would need to continue to monitor. Cannot really increase the carvedilol much, but we could potentially further titrate felodipine if necessary. I doubt he would benefit much more from a higher dose of lisinopril.      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 Now that symptomatically has improved, I would like for him to get back into the lipid clinic. He should be due for recheck lipids in the summer. Once those are checked. I would like to get him back in the lipid clinic to see how he would potentially do with the PCSK9 inhibitor.      Chronic diastolic heart failure, NYHA class 2 - PCWPf 22 mmHg during catheterization. (Chronic)    No significant symptoms whatsoever. No PND or orthopnea. Edema much improved. He is feeling as good from a cardiovascular standpoint as he has in a long time. No additional Lasix requirement. On stable dose of carvedilol and lisinopril.        Relevant Orders   EKG 12-Lead (Completed)   CAD, LAD/CFX DES 04/28/12- staged RCA DES 04/29/12 after pt  declined CABG, cath 05/14/13 stable CAD medical therapy - Primary (Chronic)    Essentially complete revascularization in the catheterization lab. He really has not had any significant anginal symptoms since then. He remains on Brilinta without any bleeding issues. He is on stable dose of carvedilol, felodipine and lisinopril. Not on statin due to significant myalgias in the past. He is on omega-3 fatty acids.      Relevant Orders   EKG 12-Lead (Completed)      Current medicines are reviewed at length with the patient today. (+/- concerns) n/a The following changes have been made: None.   Studies Ordered:   Orders Placed This Encounter  Procedures  . EKG 12-Lead    ROV 6 months.     HLeonie Man M.D., M.S. Interventional Cardiologist   Pager # [phone] # [phone] [place] [id number]

## 2015-06-01 NOTE — Patient Instructions (Signed)
NO CHANGE WITH CURRENT MEDICATIONS   Your physician wants you to follow-up in [place].  You will receive a reminder letter in the mail two months in advance. If you don't receive a letter, please call our office to schedule the follow-up appointment.  If you need a refill on your cardiac medications before your next appointment, please call your pharmacy.

## 2015-06-07 ENCOUNTER — Encounter: Payer: Self-pay | Admitting: Cardiology

## 2015-06-07 NOTE — Assessment & Plan Note (Signed)
Significant improvement. Now he is able to wear his compression stockings but not that frequently. He says that the swelling is much improved. He is seeing a new podiatrist and is very happy with the medicine is off for the neuropathy. In general this is the best I've seen his feet and legs looking in a long time.

## 2015-06-07 NOTE — Assessment & Plan Note (Signed)
Exertional dyspnea is notably improved. Overall volume status seems to be improved. I would not be surprised if we were to find his pulmonary pressures are slightly lower now. Needs to continue weight loss efforts and CPAP. He should not be fearful of using additional dose of Lasix as necessary. Do need to monitor his blood pressure to ensure that they do not increase to much, or to maximize afterload reduction.

## 2015-06-07 NOTE — Assessment & Plan Note (Signed)
Blood pressure is low but high today. He was a little rushed coming in. This is not usually a problem for him. Would need to continue to monitor. Cannot really increase the carvedilol much, but we could potentially further titrate felodipine if necessary. I doubt he would benefit much more from a higher dose of lisinopril.

## 2015-06-07 NOTE — Assessment & Plan Note (Signed)
No significant symptoms whatsoever. No PND or orthopnea. Edema much improved. He is feeling as good from a cardiovascular standpoint as he has in a long time. No additional Lasix requirement. On stable dose of carvedilol and lisinopril.

## 2015-06-07 NOTE — Assessment & Plan Note (Signed)
His weight seemed to be up a little bit from January, but he is now in the process of making a very conscious effort to adjust his diet, and trying to get back into doing something of exercise finally. Now that his neuropathy pain is improving he can hopefully get back to doing exercise

## 2015-06-07 NOTE — Assessment & Plan Note (Signed)
He now is feeling much better. Now that symptomatically has improved, I would like for him to get back into the lipid clinic. He should be due for recheck lipids in the summer. Once those are checked. I would like to get him back in the lipid clinic to see how he would potentially do with the PCSK9 inhibitor.

## 2015-06-07 NOTE — Assessment & Plan Note (Signed)
Essentially complete revascularization in the catheterization lab. He really has not had any significant anginal symptoms since then. He remains on Brilinta without any bleeding issues. He is on stable dose of carvedilol, felodipine and lisinopril. Not on statin due to significant myalgias in the past. He is on omega-3 fatty acids.

## 2015-06-26 ENCOUNTER — Telehealth: Payer: Self-pay | Admitting: Cardiology

## 2015-06-26 NOTE — Telephone Encounter (Signed)
On last Friday(06-23-15),pt said hee was lifting some light boxes.He experienced very bad pain in upper left side of chest,left shoulder,and left arm.This episode lasted about 10 minutes,saw Dr Elliot Gault today for diabetes,he told him to contact Dr Ellyn Hack today.

## 2015-06-26 NOTE — Telephone Encounter (Signed)
LMTCB

## 2015-06-26 NOTE — Telephone Encounter (Signed)
LATE ENTRY--SPOKE TO  DR Glennon Hamilton  PATIENT HAD RECENT LEFT SIDE CHEST  PAIN THAT RADIATED TO LEFT ARM  INFORMATION GIVEN TO DR H[place]   UNABLE TO REACH PATIENT---LEFT MESSAGE FOR PATIENT --APPOINTMENT SCHEDULE  FOR 06/27/15 AT 11:15 WITH DR HARDING LEFT MESSAGE TO CALL TOMORROW TO CONFIRM APPOINTMENT.

## 2015-06-27 ENCOUNTER — Ambulatory Visit (INDEPENDENT_AMBULATORY_CARE_PROVIDER_SITE_OTHER): Payer: No Typology Code available for payment source | Admitting: Cardiology

## 2015-06-27 ENCOUNTER — Encounter: Payer: Self-pay | Admitting: Cardiology

## 2015-06-27 VITALS — BP 140/80 | HR 61 | Ht 73.0 in | Wt 338.2 lb

## 2015-06-27 DIAGNOSIS — I2510 Atherosclerotic heart disease of native coronary artery without angina pectoris: Secondary | ICD-10-CM | POA: Diagnosis not present

## 2015-06-27 DIAGNOSIS — Z9861 Coronary angioplasty status: Secondary | ICD-10-CM

## 2015-06-27 DIAGNOSIS — E1169 Type 2 diabetes mellitus with other specified complication: Secondary | ICD-10-CM

## 2015-06-27 DIAGNOSIS — R079 Chest pain, unspecified: Secondary | ICD-10-CM

## 2015-06-27 DIAGNOSIS — I5032 Chronic diastolic (congestive) heart failure: Secondary | ICD-10-CM | POA: Diagnosis not present

## 2015-06-27 DIAGNOSIS — E782 Mixed hyperlipidemia: Secondary | ICD-10-CM

## 2015-06-27 NOTE — Progress Notes (Signed)
PCP:  -- I think he is now seen Dr. Shelia Media Podiatrist: Dr. [facility]  Clinic Note: Chief Complaint  Patient presents with  . Follow-up  . Chest Pain    Known history of CAD  . Shortness of Breath    HPI: Harold [name] is a 60 y.o. male with a PMH below who presents today for Dilation of an episode of chest discomfort.  Harold [name] was last seen on 06/01/2015. He was actually doing very well at that time. Was very happy with no major concerns. He is now being followed by Harold [name] (pharmacist at Dr. Pennie Banter office) for his diabetes. He is also now seeing a new podiatrist. All in all he was doing very well.  Recent Hospitalizations: None  Studies Reviewed: None  Interval History: [name] comes in today to discuss an episode of chest discomfort that happened on Friday May 5. He was moving boxes (no more than 20 pound boxes) with his [name]. He been doing this for several hours without any problems, but he had a gradual onset of left upper chest and shoulder pain that was quite severe and lasted about 10 minutes and was associated with dyspnea. It was relieved after he sat down and took some deep breaths. He had not noted any problems at all with exertional dyspnea beyond his baseline leading up to that, or following that episode. He denied any of the profound exertional dyspnea associated with it when it was occurring.  Otherwise besides that one episode, he has not had any of his anginal type symptoms of exertional dyspnea, central chest pressure, or palpitations.  He has gone back to his routine activities now and has not noted any change in his exertional dyspnea. He only has a tightness sensation in his chest if he really pushes it. Swelling is a little bit up today, but usually well controlled  No PND or orthopnea associated with edema.  No palpitations, lightheadedness, dizziness, weakness or syncope/near syncope. No TIA/amaurosis fugax symptoms. No  claudication.  ROS: A comprehensive was performed. Review of Systems  Constitutional: Positive for malaise/fatigue (Overall improved). Negative for weight loss.  HENT: Negative for congestion and nosebleeds.   Respiratory: Positive for shortness of breath (With exertion. Stable).   Cardiovascular: Positive for leg swelling (Usually controlled). Negative for claudication.       Per history of present illness  Gastrointestinal: Negative for blood in stool and melena.  Genitourinary: Negative for hematuria.  Musculoskeletal: Positive for back pain and joint pain. Negative for myalgias and falls.  Neurological: Negative for dizziness.       Improving peripheral neuropathy  Endo/Heme/Allergies: Does not bruise/bleed easily.  Psychiatric/Behavioral: Negative for depression and memory loss. The patient is not nervous/anxious and does not have insomnia.   All other systems reviewed and are negative.   Past Medical History  Diagnosis Date  . Diabetes mellitus   . Carotid artery occlusion   . Complication of anesthesia   . PONV (postoperative nausea and vomiting)     "[place]"  . Hypothyroidism   . Depression     situaltional   . Neuropathy ([place])     notably improved following PCI with improved cardiac function  . CAD S/P percutaneous coronary angioplasty 2003, 04/2012    status post PCI to LAD, circumflex-OM 2, RCA  . 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 Dyslipidemia  associated with type 2 diabetes mellitus ([place])   . GERD (gastroesophageal reflux disease)   . Chronic renal insufficiency, stage II (mild)   . Pulmonary hypertension ([place]) 04/2012    PA pressure 79mhg  . HTN (hypertension)   . Chronic venous insufficiency     varicosities, no reflux; dopplers 04/14/12- valvular insufficiency in the R and L GSV  . Anginal pain ([facility] March 2015    Cardiac cath showed patent stents with  distal LAD, circumflex-OM and RCA disease in small vessels.  . Obesity   . Sleep apnea     uses nightly  . Neuromuscular disorder ([place]     neuropathy in feet    Past Surgical History  Procedure Laterality Date  . Umbilical hernia repair  2009    steel mesh insert  . Coronary angioplasty  12/21/2001    PTCA of the distal and mid AV groove circ, unsuccessful PTCA of second OM total occlusion, unsuccessful PTCA of the apical LAD total occlusion  . Colonscopy    . Carotid endarterectomy  2005    Right; recent carotid Dopplers notes elevated velocities.   . Back surgery  2005 x1    X2-2010  . Lumbar laminectomy/decompression microdiscectomy  01/07/2012    Procedure: LUMBAR LAMINECTOMY/DECOMPRESSION MICRODISCECTOMY 1 LEVEL;  Surgeon: KWinfield Cunas MD;  [facility];  Service: Neurosurgery;  Laterality: Bilateral;   Lumbar Three-Four Decompression  . Coronary stent placement  04/28/12    PCI to LAD (3x284mXience DES postdilated to 3.25) and circ prox and mid (2 overlappinmg 2.3m61m2mmXience DES postdilated to 2.73m4m. Cardiac catheterization  12/11/2001    significant 3V CAD, normal LV function  . Coronary angioplasty with stent placement  04/29/12    PCI to 3 RCA lesions, Promus Premiere 2.23mm3m distally, mid was 2.3mx2844mand proximally 2.75x16mm, 24m5-60%  . Doppler echocardiography  04/28/2012    poor quality study: EF estimated 60-65%; unable to assess diastolic function (previously noted to have diastolic dysfunction); severely dilated left atrium and mild right atrium; dilated IVC consistent with increased central venous pressure..  . Carotid doppler  04/27/2012    right internal carotid: Elevated velocities but no evidence of plaque. Left internal carotid 40-59%  . Abis  04/27/2012    mild bilateral arterial insufficiency  . Cardiac catheterization  March 2015    Moderate Pulm HTN: 46/16 - mean 33 mmHg; PCWP 22mmHg;53mltivessel CAD with widely patent mid LAD stents  and 90% apical LAD, widely patent proximal and mid cervical extends with previous subtotal occlusion of smaller tube, I be patent RCA ostial mid and distal stents with 70% distal continuation RCA disease  . Bunionectomy Right 11/11/2013    Procedure: RIGHT FOOT SILVER BUNIONECTOMY;  Surgeon: John HewWylene Simmerocation: [facility]: [facility];  Laterality: Right;  . Abdominal aortagram N/A 11/15/2011    Procedure: ABDOMINAL AORTAGRAM;  Surgeon: Charles Elam Dutchocation[facility];  Service: Cardiovascular;  [name];  . Left and right heart catheterization with coronary angiogram N/A 04/27/2012    Procedure: LEFT AND RIGHT HEART CATHETERIZATION WITH CORONARY ANGIOGRAM;  Surgeon: David W Leonie Manocation[facility];  Service: Cardiovascular;  [name];  . Percutaneous coronary stent intervention (pci-s) N/A 04/28/2012    Procedure: PERCUTANEOUS CORONARY STENT INTERVENTION (PCI-S);  Surgeon: Thomas ATroy Sineocation[facility];  Service: Cardiovascular;  [name];  . Percutaneous coronary stent intervention (pci-s) N/A 04/29/2012  Procedure: PERCUTANEOUS CORONARY STENT INTERVENTION (PCI-S);  Surgeon: Leonie Man, MD;  Location: [facility] LAB;  Service: Cardiovascular;  [name];  . Left and right heart catheterization with coronary angiogram N/A 05/14/2013    Procedure: LEFT AND RIGHT HEART CATHETERIZATION WITH CORONARY ANGIOGRAM;  Surgeon: Troy Sine, MD;  Location: [facility] CATH LAB;  Service: Cardiovascular;  [name];    Prior to Admission medications   Medication Sig Start Date End Date Taking? Authorizing Provider  azithromycin (ZITHROMAX) 250 MG tablet Take 2 today then one a day for 4 days 04/19/15  Yes [name], MD  BRILINTA 90 MG TABS tablet TAKE 1 TABLET (90 MG TOTAL) BY MOUTH 2 (TWO) TIMES DAILY. 03/15/15  Yes Leonie Man, MD  carvedilol (COREG) 6.25 MG tablet TAKE 1 TABLET (6.25 MG TOTAL) BY MOUTH 2  (TWO) TIMES DAILY. 12/06/14  Yes Leonie Man, MD  cholecalciferol (VITAMIN D) 1000 UNITS tablet Take 1,000 Units by mouth 4 (four) times daily.    Yes Historical Provider, MD  Choline Fenofibrate (FENOFIBRIC ACID) 135 MG CPDR Take 1 capsule by mouth daily. 01/06/14  Yes Historical Provider, MD  escitalopram (LEXAPRO) 20 MG tablet TAKE 1 TABLET (20 MG TOTAL) BY MOUTH DAILY. 03/09/15  Yes [name], MD  esomeprazole (NEXIUM) 40 MG capsule TAKE 1 CAPSULE BY MOUTH DAILY BEFORE BREAKFAST. 05/01/15  Yes Leonie Man, MD  felodipine (PLENDIL) 10 MG 24 hr tablet TAKE 1 TABLET BY MOUTH DAILY. 05/15/15  Yes Leonie Man, MD  fish oil-omega-3 fatty acids 1000 MG capsule Take 1,200 mg by mouth daily.    Yes Historical Provider, MD  furosemide (LASIX) 40 MG tablet Take 1 tablet (40 mg total) by mouth daily. 04/11/15  Yes Leonie Man, MD  Garlic 818 MG CAPS Take 500 mg by mouth daily.   Yes Historical Provider, MD  insulin aspart (NOVOLOG) 100 UNIT/ML injection Inject 75 Units into the skin 2 (two) times daily. IN ADDITION 25 MG ONCE ADAY 04/29/11  Yes [name], MD  Insulin Detemir (LEVEMIR) 100 UNIT/ML Pen Inject into the skin 2 (two) times daily. 75 units twice a day   Yes Historical Provider, MD  levothyroxine (SYNTHROID, LEVOTHROID) 88 MCG tablet Take 88 mcg by mouth daily before breakfast.   Yes Historical Provider, MD  lisinopril (PRINIVIL,ZESTRIL) 20 MG tablet Take 1 tablet (20 mg total) by mouth daily. 12/17/14  Yes Shawnee Knapp, MD  Lutein 20 MG TABS Take by mouth.   Yes Historical Provider, MD  metFORMIN (GLUCOPHAGE) 1000 MG tablet Take 1 tablet (1,000 mg total) by mouth 2 (two) times daily with a meal. 05/17/13  Yes Isaiah Serge, NP  Misc Natural Products (LUTEIN 20 PO) Take 20 mg by mouth daily.   Yes Historical Provider, MD  Multiple Vitamins-Minerals (MULTIVITAMIN WITH MINERALS) tablet Take 1 tablet by mouth daily.   Yes Historical Provider, MD  NITROSTAT 0.4 MG SL tablet PLACE 1  TAB UNDER THE TONGUE EVERY 5 MINUTES AS NEEDED FOR CHEST PAIN (SEVERE PRESSURE OR TIGHTNESS) 03/24/14  Yes Leonie Man, MD  ondansetron (ZOFRAN ODT) 4 MG disintegrating tablet Take 1 tablet (4 mg total) by mouth every 4 (four) hours as needed for nausea or vomiting. 06/29/14  Yes Charlesetta Shanks, MD  OVER THE COUNTER MEDICATION Sea kelp 150 mcg once per dAY   Yes Historical Provider, MD  TURMERIC PO Take 800 mg by mouth daily.   Yes Historical Provider, MD  vitamin C (ASCORBIC ACID) 500 MG tablet Take 1,000 mg by mouth daily.   Yes Historical Provider, MD    Allergies  Allergen Reactions  . Septra [Bactrim] Itching  . Penicillins Hives    Allergy to All cillin drugs  Ancef given 9/24 with no obvious reaction  . Clopidogrel     NON RESPONDER  P2Y12  . Metformin And Related   . Other     Walnuts, pecans     Social History   Social History  . Marital Status: Single    Spouse Name: N/A  . Number of Children: 0  . Years of Education: hs   Occupational History  . [employer]   .     Social History Main Topics  . Smoking status: Former Smoker -- 1.00 packs/day for 42 years    Types: Cigarettes    Quit date: 03/23/2003  . Smokeless tobacco: Never Used  . Alcohol Use: No  . Drug Use: No  . Sexual Activity: Not Asked   Other Topics Concern  . None   Social History Narrative   He and his partner Harold [name] have now officially gotten married on 05/26/2013.  Harold [name] is also a patient of our clinic.  He is now initially hyphenated his last name.   He works at [employer].   He does not smoke, he quit in 2009.  He does not drink alcohol.   He was adopted and no family history is known.    He was adopted. Family history is unknown by patient.  Wt Readings from Last 3 Encounters:  06/27/15 338 lb 3.2 oz (153.407 kg)  06/01/15 338 lb (153.316 kg)  04/19/15 333 lb (151.048 kg)    PHYSICAL EXAM BP 140/80 mmHg  Pulse 61  Ht _0  (1.854 m)  Wt 338 lb 3.2 oz  (153.407 kg)  BMI 44.63 kg/m2 General appearance: alert, cooperative, appears stated age, no distress and morbidly obese; Normal Mood & Affect Neck: no adenopathy, no carotid bruit and no JVD Lungs: clear to auscultation bilaterally, normal percussion bilaterally and non-labored Heart: regular rate and rhythm, distant but normal S1& S2, no murmur, click, rub or gallop; unable to palpate PMI Abdomen: soft, non-tender; bowel sounds normal; no masses, no organomegaly; Cannot palpate for HSM due to obesity Extremities: extremities normal, atraumatic, no cyanosis, and edema~ 2+ to knees with venous stasis changes - but notably improved from his prior baseline Pulses: 2+ and symmetric B UE - cannot palpate LE pulses due to edema Skin: mobility and turgor normal; BLE venous stasis changes - notably less significant. Edema notably improved. Skin has normal texture with no lesions. Neurologic: Mental status: Alert, oriented, thought content appropriate Cranial nerves: normal (II-XII grossly intact)    Adult ECG Report  Rate: 61 ;  Rhythm: normal sinus rhythm and Nonspecific ST and T-wave abnormalities.;   Narrative Interpretation: Stable EKG   Other studies Reviewed: Additional studies/ records that were reviewed today include:  Recent Labs:  No new labs available   ASSESSMENT / PLAN: Problem List Items Addressed This Visit    Obesity, Class III, BMI 40-49.9 (morbid obesity) ([place]) (Chronic)    The patient understands the need to lose weight with diet and exercise. We have discussed specific strategies for this.      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 Unfortunately with his recent chest pain episode I would like to see that through before we refer.      Chronic diastolic heart failure, NYHA class 2 - PCWPf 22 mmHg  during catheterization. (Chronic)    He does have baseline exertional dyspnea, but I could easily be related to his obesity. Edema has improved but but is still present. He is on a standing dose of furosemide and does occasionally take an additional dose. He is otherwise on a good dose of beta blocker, ACE inhibitor and calcium channel blocker.      Relevant Orders   EKG 12-Lead (Completed)   Chest pain with low risk for cardiac etiology    ACR about 10 minute episode of chest pain that was left upper chest and into the shoulder and down the right upper arm. The pain was associated with moving boxes and relieved with rest.  The patient is not reproducible, but it was not associated with his classic anginal type symptoms of exertional dyspnea.  He had some pretty significant anginal equivalent symptoms. I'm not convinced that this is an episode is truly cardiac. For now I would prefer watchful waiting to see if any symptoms recur, or if his exertional dyspnea worsens. If so he will contact us and we will just move straight to performing a stress test. Otherwise I think this is probably chest wall or musculoskeletal pain from his moving boxes. He may just simply be overdoing it for himself.      Relevant Orders   EKG 12-Lead (Completed)   CAD, LAD/CFX DES 04/28/12- staged RCA DES 04/29/12 after pt declined CABG, cath 05/14/13 stable CAD medical therapy - Primary (Chronic)    He basically had Cath Lab CABG with complete revascularization in the Cath Lab. He really hasn't had his typical anginal symptoms and a long time. He did have that chest pains or, but has not had anymore since then. It would be unlikely for him to have a lesion significant effort in because 10 minutes of pain and then not have another episode over the weekend with her routine activity.  For now continue with Brilinta, available, felodipine and lisinopril. He is not on statin due to significant myalgias.      Relevant Orders    EKG 12-Lead (Completed)      Current medicines are reviewed at length with the patient today. (+/- concerns) n/a The following changes have been made: n/a Studies Ordered:   Orders Placed This Encounter  Procedures  . EKG 12-Lead      Glenetta Hew, M.D., M.S. Interventional Cardiologist   Pager # [phone] Phone # [phone] [address]. [place], [place] [address]

## 2015-06-27 NOTE — Patient Instructions (Signed)
THINK YOUR EPISODE MAY BE MUSCULOSKELETAL DISCOMFORT  IF IT HAPPENS AGAIN - CONTACT OFFICE WILL SCHEDULE LEXISCAN MYOVIEW  NO OTHER CHANGES AT PRESENT.  Your physician recommends that you schedule a follow-up appointment FOR (OCT 2017 RECALL DATE) WITH DR Ellyn Hack

## 2015-06-29 ENCOUNTER — Encounter: Payer: Self-pay | Admitting: Cardiology

## 2015-06-29 NOTE — Assessment & Plan Note (Signed)
The patient understands the need to lose weight with diet and exercise. We have discussed specific strategies for this.  

## 2015-06-29 NOTE — Assessment & Plan Note (Signed)
He does have baseline exertional dyspnea, but I could easily be related to his obesity. Edema has improved but but is still present. He is on a standing dose of furosemide and does occasionally take an additional dose. He is otherwise on a good dose of beta blocker, ACE inhibitor and calcium channel blocker.

## 2015-06-29 NOTE — Assessment & Plan Note (Signed)
He basically had Cath Lab CABG with complete revascularization in the Cath Lab. He really hasn't had his typical anginal symptoms and a long time. He did have that chest pains or, but has not had anymore since then. It would be unlikely for him to have a lesion significant effort in because 10 minutes of pain and then not have another episode over the weekend with her routine activity.  For now continue with Brilinta, available, felodipine and lisinopril. He is not on statin due to significant myalgias.

## 2015-06-29 NOTE — Assessment & Plan Note (Signed)
The plan was for him to return to our pharmacy lipid team to discuss PCSK9 inhibitor therapy once he was feeling better. Unfortunately with his recent chest pain episode I would like to see that through before we refer.

## 2015-06-29 NOTE — Assessment & Plan Note (Signed)
ACR about 10 minute episode of chest pain that was left upper chest and into the shoulder and down the right upper arm. The pain was associated with moving boxes and relieved with rest.  The patient is not reproducible, but it was not associated with his classic anginal type symptoms of exertional dyspnea.  He had some pretty significant anginal equivalent symptoms. I'm not convinced that this is an episode is truly cardiac. For now I would prefer watchful waiting to see if any symptoms recur, or if his exertional dyspnea worsens. If so he will contact us and we will just move straight to performing a stress test. Otherwise I think this is probably chest wall or musculoskeletal pain from his moving boxes. He may just simply be overdoing it for himself.

## 2015-07-06 ENCOUNTER — Other Ambulatory Visit: Payer: Self-pay | Admitting: Cardiology

## 2015-07-06 NOTE — Telephone Encounter (Signed)
Spoke to patient Patient would like to pick up  Patient informed will pick up on 07/14/15 after it is completed. Patient states he needs a written prescription to take to job- to have refill Patient would like a 90 day  Supply patient states it will be okay until he pick up medications

## 2015-07-06 NOTE — Telephone Encounter (Signed)
New message       *STAT* If patient is at the pharmacy, call can be transferred to refill team.   1. Which medications need to be refilled? (please list name of each medication and dose if known) lisinopril  30m  2. Which pharmacy/location (including street and city if local pharmacy) is medication to be sent to? Pt need to pick up presc--call when ready 3. Do they need a 30 day or 90 day supply? 30 day

## 2015-07-12 MED ORDER — LISINOPRIL 20 MG PO TABS: 20.0000 mg | ORAL_TABLET | Freq: Every day | ORAL | Status: DC

## 2015-07-14 NOTE — Telephone Encounter (Signed)
Left message on voice mail  prescription is ready to be picked up

## 2015-07-16 ENCOUNTER — Other Ambulatory Visit: Payer: Self-pay | Admitting: Cardiology

## 2015-07-18 NOTE — Telephone Encounter (Signed)
Rx has been sent to the pharmacy electronically.

## 2015-08-31 ENCOUNTER — Other Ambulatory Visit: Payer: Self-pay | Admitting: Family Medicine

## 2015-09-01 LAB — CMP12+LP+TP+TSH+6AC+PSA+CBC…
ALBUMIN: 3.9 g/dL (ref 3.6–4.8)
ALT: 50 IU/L — AB (ref 0–44)
AST: 48 IU/L — AB (ref 0–40)
Albumin/Globulin Ratio: 1.7 (ref 1.2–2.2)
Alkaline Phosphatase: 58 IU/L (ref 39–117)
BASOS: 1 %
BUN / CREAT RATIO: 23 (ref 10–24)
BUN: 28 mg/dL — ABNORMAL HIGH (ref 8–27)
Basophils Absolute: 0 10*3/uL (ref 0.0–0.2)
Bilirubin Total: 0.4 mg/dL (ref 0.0–1.2)
CALCIUM: 9.1 mg/dL (ref 8.6–10.2)
CHOL/HDL RATIO: 6.3 ratio — AB (ref 0.0–5.0)
CHOLESTEROL TOTAL: 291 mg/dL — AB (ref 100–199)
Chloride: 95 mmol/L — ABNORMAL LOW (ref 96–106)
Creatinine, Ser: 1.22 mg/dL (ref 0.76–1.27)
EOS (ABSOLUTE): 0.1 10*3/uL (ref 0.0–0.4)
ESTIMATED CHD RISK: 1.3 times avg. — AB (ref 0.0–1.0)
Eos: 3 %
FREE THYROXINE INDEX: 1.9 (ref 1.2–4.9)
GFR calc Af Amer: 74 mL/min/{1.73_m2} (ref >59)
GFR, EST NON AFRICAN AMERICAN: 64 mL/min/{1.73_m2} (ref >59)
GGT: 46 IU/L (ref 0–65)
GLOBULIN, TOTAL: 2.3 g/dL (ref 1.5–4.5)
Glucose: 322 mg/dL — ABNORMAL HIGH (ref 65–99)
HDL: 46 mg/dL (ref >39)
HEMATOCRIT: 42.5 % (ref 37.5–51.0)
Hemoglobin: 13.8 g/dL (ref 12.6–17.7)
IMMATURE GRANULOCYTES: 1 %
Immature Grans (Abs): 0 10*3/uL (ref 0.0–0.1)
Iron: 116 ug/dL (ref 38–169)
LDH: 153 IU/L (ref 121–224)
LDL Calculated: 177 mg/dL — ABNORMAL HIGH (ref 0–99)
LYMPHS ABS: 1.2 10*3/uL (ref 0.7–3.1)
Lymphs: 31 %
MCH: 30.1 pg (ref 26.6–33.0)
MCHC: 32.5 g/dL (ref 31.5–35.7)
MCV: 93 fL (ref 79–97)
MONOS ABS: 0.3 10*3/uL (ref 0.1–0.9)
Monocytes: 6 %
NEUTROS ABS: 2.3 10*3/uL (ref 1.4–7.0)
NEUTROS PCT: 58 %
PHOSPHORUS: 3 mg/dL (ref 2.5–4.5)
POTASSIUM: 4.4 mmol/L (ref 3.5–5.2)
PROSTATE SPECIFIC AG, SERUM: 0.4 ng/mL (ref 0.0–4.0)
Platelets: 161 10*3/uL (ref 150–379)
RBC: 4.59 x10E6/uL (ref 4.14–5.80)
RDW: 14.5 % (ref 12.3–15.4)
SODIUM: 137 mmol/L (ref 134–144)
T3 Uptake Ratio: 28 % (ref 24–39)
T4 TOTAL: 6.8 ug/dL (ref 4.5–12.0)
TRIGLYCERIDES: 340 mg/dL — AB (ref 0–149)
TSH: 1.7 u[IU]/mL (ref 0.450–4.500)
Total Protein: 6.2 g/dL (ref 6.0–8.5)
Uric Acid: 5.6 mg/dL (ref 3.7–8.6)
VLDL Cholesterol Cal: 68 mg/dL — ABNORMAL HIGH (ref 5–40)
WBC: 4 10*3/uL (ref 3.4–10.8)

## 2015-09-01 LAB — HGB A1C W/O EAG: HEMOGLOBIN A1C: 9.9 % — AB (ref 4.8–5.6)

## 2015-09-05 ENCOUNTER — Encounter: Payer: PRIVATE HEALTH INSURANCE | Admitting: Emergency Medicine

## 2015-09-07 ENCOUNTER — Other Ambulatory Visit: Payer: Self-pay | Admitting: Nephrology

## 2015-09-07 DIAGNOSIS — N183 Chronic kidney disease, stage 3 (moderate): Secondary | ICD-10-CM

## 2015-09-08 ENCOUNTER — Other Ambulatory Visit: Payer: Self-pay

## 2015-09-14 ENCOUNTER — Encounter: Payer: PRIVATE HEALTH INSURANCE | Admitting: Emergency Medicine

## 2015-09-21 ENCOUNTER — Ambulatory Visit
Admission: RE | Admit: 2015-09-21 | Discharge: 2015-09-21 | Disposition: A | Discharge status: Home / Self Care | Payer: No Typology Code available for payment source | Source: Ambulatory Visit | Attending: Nephrology | Admitting: Nephrology

## 2015-09-21 DIAGNOSIS — N183 Chronic kidney disease, stage 3 (moderate): Secondary | ICD-10-CM

## 2015-09-29 ENCOUNTER — Other Ambulatory Visit: Payer: Self-pay | Admitting: Emergency Medicine

## 2015-09-29 ENCOUNTER — Other Ambulatory Visit: Payer: Self-pay | Admitting: Cardiology

## 2015-09-29 NOTE — Telephone Encounter (Signed)
REFILL 

## 2015-10-16 ENCOUNTER — Ambulatory Visit (INDEPENDENT_AMBULATORY_CARE_PROVIDER_SITE_OTHER): Payer: PRIVATE HEALTH INSURANCE | Admitting: Emergency Medicine

## 2015-10-16 ENCOUNTER — Encounter: Payer: Self-pay | Admitting: Emergency Medicine

## 2015-10-16 VITALS — BP 136/88 | HR 67 | Resp 18 | Ht 73.0 in | Wt 330.0 lb

## 2015-10-16 DIAGNOSIS — Z114 Encounter for screening for human immunodeficiency virus [HIV]: Secondary | ICD-10-CM

## 2015-10-16 DIAGNOSIS — Z125 Encounter for screening for malignant neoplasm of prostate: Secondary | ICD-10-CM | POA: Diagnosis not present

## 2015-10-16 DIAGNOSIS — Z1159 Encounter for screening for other viral diseases: Secondary | ICD-10-CM

## 2015-10-16 DIAGNOSIS — E1149 Type 2 diabetes mellitus with other diabetic neurological complication: Secondary | ICD-10-CM

## 2015-10-16 DIAGNOSIS — Z0000 Encounter for general adult medical examination without abnormal findings: Secondary | ICD-10-CM

## 2015-10-16 DIAGNOSIS — IMO0001 Reserved for inherently not codable concepts without codable children: Secondary | ICD-10-CM

## 2015-10-16 DIAGNOSIS — Z23 Encounter for immunization: Secondary | ICD-10-CM | POA: Diagnosis not present

## 2015-10-16 DIAGNOSIS — Z1211 Encounter for screening for malignant neoplasm of colon: Secondary | ICD-10-CM | POA: Diagnosis not present

## 2015-10-16 DIAGNOSIS — G4733 Obstructive sleep apnea (adult) (pediatric): Secondary | ICD-10-CM

## 2015-10-16 DIAGNOSIS — R030 Elevated blood-pressure reading, without diagnosis of hypertension: Secondary | ICD-10-CM | POA: Diagnosis not present

## 2015-10-16 LAB — COMPLETE METABOLIC PANEL WITH GFR
ALBUMIN: 4.1 g/dL (ref 3.6–5.1)
ALK PHOS: 62 U/L (ref 40–115)
ALT: 46 U/L (ref 9–46)
AST: 32 U/L (ref 10–35)
BILIRUBIN TOTAL: 0.5 mg/dL (ref 0.2–1.2)
BUN: 36 mg/dL — AB (ref 7–25)
CALCIUM: 9.4 mg/dL (ref 8.6–10.3)
CHLORIDE: 96 mmol/L — AB (ref 98–110)
CO2: 26 mmol/L (ref 20–31)
CREATININE: 1.51 mg/dL — AB (ref 0.70–1.25)
GFR, Est African American: 56 mL/min — ABNORMAL LOW (ref >=60)
GFR, Est Non African American: 49 mL/min — ABNORMAL LOW (ref >=60)
Glucose, Bld: 453 mg/dL — ABNORMAL HIGH (ref 65–99)
Potassium: 4.9 mmol/L (ref 3.5–5.3)
Sodium: 134 mmol/L — ABNORMAL LOW (ref 135–146)
TOTAL PROTEIN: 6.8 g/dL (ref 6.1–8.1)

## 2015-10-16 LAB — POCT CBC
Granulocyte percent: 62.1 %G (ref 37–80)
HCT, POC: 41 % — AB (ref 43.5–53.7)
Hemoglobin: 14.8 g/dL (ref 14.1–18.1)
LYMPH, POC: 1.5 (ref 0.6–3.4)
MCH, POC: 31.4 pg — AB (ref 27–31.2)
MCHC: 36 g/dL — AB (ref 31.8–35.4)
MCV: 87 fL (ref 80–97)
MID (CBC): 0.4 (ref 0–0.9)
MPV: 8.8 fL (ref 0–99.8)
PLATELET COUNT, POC: 179 10*3/uL (ref 142–424)
POC Granulocyte: 3.2 (ref 2–6.9)
POC LYMPH %: 30.1 % (ref 10–50)
POC MID %: 7.8 %M (ref 0–12)
RBC: 4.71 M/uL (ref 4.69–6.13)
RDW, POC: 13.6 %
WBC: 5.1 10*3/uL (ref 4.6–10.2)

## 2015-10-16 LAB — HIV ANTIBODY (ROUTINE TESTING W REFLEX): HIV 1&2 Ab, 4th Generation: NONREACTIVE

## 2015-10-16 LAB — TSH: TSH: 1.31 mIU/L (ref 0.40–4.50)

## 2015-10-16 LAB — PSA: PSA: 0.3 ng/mL (ref <=4.0)

## 2015-10-16 NOTE — Progress Notes (Signed)
Patient ID: [name], male   DOB: 1955-10-18, 60 y.o.   MRN: [id number]    By signing my name below I, Tereasa Coop, attest that this documentation has been prepared under the direction and in the presence of Arlyss Queen, MD. Electonically Signed. Tereasa Coop, Scribe 10/16/2015 at 11:10 AM   Chief Complaint:  Chief Complaint  Patient presents with  . Annual Exam    HPI: Harold [name] is a 60 y.o. male who reports to [facility] today for his annual physical exam.   Pt was seen and evaluated by cardiology within the past 6 months.   Pt is in a DM study and being closely followed.   Pt states his vision is intermittently blurry and pt is planning on making an appointment with his eye doctor soon.   Pt c/o trigger finger and numbness in left ring finger and is being followed by hand specialist for finger symptoms.   Pt had a renal US done on 09/21/15.   Pt had a colonoscopy 12 years ago with Dr Benson Norway.   Pt is also being followed by podiatrist every 6 weeks. Was given metanx by podiatrist. Pt states that his neuropathy has been improving immensely after starting metanx.  Pt reports that he is walking farther and for longer periods of time than he used to. Pt states he tries to walk 30 minutes every day.     Past Medical History:  Diagnosis Date  . Anginal pain [facility]) March 2015   Cardiac cath showed patent stents with distal LAD, circumflex-OM and RCA disease in small vessels.  . Anxiety   . CAD S/P percutaneous coronary angioplasty 2003, 04/2012   status post PCI to LAD, circumflex-OM 2, RCA  . Carotid artery occlusion   . Chronic renal insufficiency, stage II (mild)   . Chronic venous insufficiency    varicosities, no reflux; dopplers 04/14/12- valvular insufficiency in the R and L GSV  . Complication of anesthesia   . Depression    situaltional   . Diabetes mellitus   . Dyslipidemia associated with type 2 diabetes mellitus ([place])   . GERD (gastroesophageal  reflux disease)   . HTN (hypertension)   . Hypothyroidism   . Neuromuscular disorder (HCC)    neuropathy in feet  . Neuropathy ([place])    notably improved following PCI with improved cardiac function  . Obesity   . Obesity, Class II, BMI 35.0-39.9, with comorbidity (see actual BMI) (HCC)    BMI 39; wgt loss efforts in place; seeing Dietician  . PONV (postoperative nausea and vomiting)    "[place]"  . Pulmonary hypertension ([place]) 04/2012   PA pressure 7mhg  . Sleep apnea    uses nightly   Past Surgical History:  Procedure Laterality Date  . ABDOMINAL AORTAGRAM N/A 11/15/2011   Procedure: ABDOMINAL AMaxcine Ham  Surgeon: CElam Dutch MD;  [facility];  Service: Cardiovascular;  [name];  . ABIs  04/27/2012   mild bilateral arterial insufficiency  . BACK SURGERY  2005 x1   X2-2010  . BUNIONECTOMY Right 11/11/2013   Procedure: RIGHT FOOT SILVER BUNIONECTOMY;  Surgeon: JWylene Simmer MD;  [place]  Service: Orthopedics;  Laterality: Right;  . CARDIAC CATHETERIZATION  12/11/2001   significant 3V CAD, normal LV function  . CARDIAC CATHETERIZATION  March 2015   Moderate Pulm HTN: 46/16 - mean 33 mmHg; PCWP 243mg;; [name], widely  patent proximal and mid cervical extends with previous subtotal occlusion of smaller tube, I be patent RCA ostial mid and distal stents with 70% distal continuation RCA disease  . Carotid Doppler  04/27/2012   right internal carotid: Elevated velocities but no evidence of plaque. Left internal carotid 40-59%  . CAROTID ENDARTERECTOMY  2005   Right; recent carotid Dopplers notes elevated velocities.   . colonscopy    . CORONARY ANGIOPLASTY  12/21/2001   PTCA of the distal and mid AV groove circ, unsuccessful PTCA of second OM total occlusion, unsuccessful PTCA of the apical LAD total occlusion  . CORONARY ANGIOPLASTY WITH STENT PLACEMENT  04/29/12   PCI to  3 RCA lesions, Promus Premiere 2.73mx8mm distally, mid was 2.584m8mm and proximally 2.75x1648mEF 55-60%  . CORONARY STENT PLACEMENT  04/28/12   PCI to LAD (3x23m72mence DES postdilated to 3.25) and circ prox and mid (2 overlappinmg 2.5mmx27mmXience DES postdilated to 2.75mm)16mDOPPLER ECHOCARDIOGRAPHY  04/28/2012   poor quality study: EF estimated 60-65%; unable to assess diastolic function (previously noted to have diastolic dysfunction); severely dilated left atrium and mild right atrium; dilated IVC consistent with increased central venous pressure..  . HERNIA REPAIR    . LEFT AND RIGHT HEART CATHETERIZATION WITH CORONARY ANGIOGRAM N/A 04/27/2012   Procedure: LEFT AND RIGHT HEART CATHETERIZATION WITH CORONARY ANGIOGRAM;  Surgeon: David Leonie Man [facility];  Service: Cardiovascular;  [name];  . LEFT AND RIGHT HEART CATHETERIZATION WITH CORONARY ANGIOGRAM N/A 05/14/2013   Procedure: LEFT AND RIGHT HEART CATHETERIZATION WITH CORONARY ANGIOGRAM;  Surgeon: ThomasTroy Sine [facility];  Service: Cardiovascular;  [name];  . LUMBAR LAMINECTOMY/DECOMPRESSION MICRODISCECTOMY  01/07/2012   Procedure: LUMBAR LAMINECTOMY/DECOMPRESSION MICRODISCECTOMY 1 LEVEL;  Surgeon: Kyle LWinfield Cunas [facility];  Service: Neurosurgery;  Laterality: Bilateral;   Lumbar Three-Four Decompression  . PERCUTANEOUS CORONARY STENT INTERVENTION (PCI-S) N/A 04/28/2012   Procedure: PERCUTANEOUS CORONARY STENT INTERVENTION (PCI-S);  Surgeon: ThomasTroy Sine [facility];  Service: Cardiovascular;  [name];  . PERCUTANEOUS CORONARY STENT INTERVENTION (PCI-S) N/A 04/29/2012   Procedure: PERCUTANEOUS CORONARY STENT INTERVENTION (PCI-S);  Surgeon: David Leonie Man [facility];  Service: Cardiovascular;  [name];  . SPINE SURGERY    . UMBILICAL HERNIA REPAIR  2009   steel mesh insert   Social History   Social History  .  Marital status: Single    Spouse name: N/A  . Number of children: 0  . Years of education: hs   Occupational History  . [employer]   .  [employer]   Social History Main Topics  . Smoking status: Former Smoker    Packs/day: 1.00    Years: 42.00    Types: Cigarettes    Quit date: 03/23/2003  . Smokeless tobacco: Never Used  . Alcohol use No  . Drug use: No  . Sexual activity: Not Asked   Other Topics Concern  . None   Social History Narrative   He and his partner GeorgeIona Beardnow officially gotten married on 05/26/2013.  GeorgeIona Beardso a patient of our clinic.  He is now initially hyphenated his last name.   He works at [employer] does not smoke, he quit in 2009.  He does not drink alcohol.   He was adopted and no family history is known.    Family History  Problem Relation  Age of Onset  . Adopted: Yes  . Family history unknown: Yes   Allergies  Allergen Reactions  . Septra [Bactrim] Itching  . Penicillins Hives    Allergy to All cillin drugs  Ancef given 9/24 with no obvious reaction  . Clopidogrel     NON RESPONDER  P2Y12  . Metformin And Related   . Other     Walnuts, pecans    Prior to Admission medications   Medication Sig Start Date End Date Taking? Authorizing Provider  carvedilol (COREG) 6.25 MG tablet TAKE 1 TABLET (6.25 MG TOTAL) BY MOUTH 2 (TWO) TIMES DAILY. 07/18/15  Yes Leonie Man, MD  Choline Fenofibrate (FENOFIBRIC ACID) 135 MG CPDR Take 1 capsule by mouth daily. 01/06/14  Yes Historical Provider, MD  escitalopram (LEXAPRO) 20 MG tablet TAKE 1 TABLET (20 MG TOTAL) BY MOUTH DAILY. 09/30/15  Yes [name], MD  esomeprazole (NEXIUM) 40 MG capsule TAKE 1 CAPSULE BY MOUTH DAILY BEFORE BREAKFAST. 05/01/15  Yes Leonie Man, MD  felodipine (PLENDIL) 10 MG 24 hr tablet TAKE 1 TABLET BY MOUTH DAILY. 05/15/15  Yes Leonie Man, MD  furosemide (LASIX) 40 MG tablet Take 1 tablet (40 mg total) by mouth daily. 04/11/15  Yes  Leonie Man, MD  insulin aspart (NOVOLOG) 100 UNIT/ML injection Inject 75 Units into the skin 2 (two) times daily. IN ADDITION 25 MG ONCE ADAY 04/29/11  Yes [name], MD  Insulin Detemir (LEVEMIR) 100 UNIT/ML Pen Inject into the skin 2 (two) times daily. 75 units twice a day   Yes Historical Provider, MD  levothyroxine (SYNTHROID, LEVOTHROID) 88 MCG tablet Take 88 mcg by mouth daily before breakfast.   Yes Historical Provider, MD  lisinopril (PRINIVIL,ZESTRIL) 20 MG tablet Take 1 tablet (20 mg total) by mouth daily. 07/12/15  Yes Leonie Man, MD  NITROSTAT 0.4 MG SL tablet PLACE 1 TAB UNDER THE TONGUE EVERY 5 MINUTES AS NEEDED FOR CHEST PAIN (SEVERE PRESSURE OR TIGHTNESS) 03/24/14  Yes Leonie Man, MD  ondansetron (ZOFRAN ODT) 4 MG disintegrating tablet Take 1 tablet (4 mg total) by mouth every 4 (four) hours as needed for nausea or vomiting. 06/29/14  Yes Charlesetta Shanks, MD  ticagrelor (BRILINTA) 90 MG TABS tablet Take 1 tablet (90 mg total) by mouth 2 (two) times daily. KEEP OV. 09/29/15  Yes Leonie Man, MD  cholecalciferol (VITAMIN D) 1000 UNITS tablet Take 1,000 Units by mouth 4 (four) times daily.     Historical Provider, MD  Lutein 20 MG TABS Take by mouth.    Historical Provider, MD  metFORMIN (GLUCOPHAGE) 1000 MG tablet Take 1 tablet (1,000 mg total) by mouth 2 (two) times daily with a meal. Patient not taking: Reported on 10/16/2015 05/17/13   Isaiah Serge, NP  OVER THE COUNTER MEDICATION Sea kelp 150 mcg once per dAY    Historical Provider, MD     ROS: The patient denies fevers, chills, night sweats, unintentional weight loss, chest pain, palpitations, wheezing, dyspnea on exertion, nausea, vomiting, abdominal pain, dysuria, hematuria, melena, numbness, weakness, or tingling.   All other systems have been reviewed and were otherwise negative with the exception of those mentioned in the HPI and as above.    PHYSICAL EXAM: Vitals:   10/16/15 1013  BP: 136/88  Pulse:  67  Resp: 18   Body mass index is 43.54 kg/m.   General: Alert, no acute distress HEENT:  Normocephalic, atraumatic, oropharynx patent. TMs normal bilat.  Eye: Juliette Mangle [facility] Cardiovascular:  Regular rate and rhythm, no rubs murmurs or gallops.  No Carotid bruits, radial pulse intact. No pedal edema.  Respiratory: Clear to auscultation bilaterally.  No wheezes, rales, or rhonchi.  No cyanosis, no use of accessory musculature Abdominal: No organomegaly, abdomen is soft and non-tender, positive bowel sounds.  No masses. Musculoskeletal: Gait intact. No edema, tenderness. Pt has stasis changes in extremities. Skin: No rashes. Pt has multiple well healed surgical scars.  Neurologic: Facial musculature symmetric. Psychiatric: Patient acts appropriately throughout our interaction. Lymphatic: No cervical or submandibular lymphadenopathy Rectal: Prostate normal exam, no rectal masses.    LABS: Results for orders placed or performed in visit on 10/16/15  POCT CBC  Result Value Ref Range   WBC 5.1 4.6 - 10.2 K/uL   Lymph, poc 1.5 0.6 - 3.4   POC LYMPH PERCENT 30.1 10 - 50 %L   MID (cbc) 0.4 0 - 0.9   POC MID % 7.8 0 - 12 %M   POC Granulocyte 3.2 2 - 6.9   Granulocyte percent 62.1 37 - 80 %G   RBC 4.71 4.69 - 6.13 M/uL   Hemoglobin 14.8 14.1 - 18.1 g/dL   HCT, POC 41.0 (A) 43.5 - 53.7 %   MCV 87.0 80 - 97 fL   MCH, POC 31.4 (A) 27 - 31.2 pg   MCHC 36.0 (A) 31.8 - 35.4 g/dL   RDW, POC 13.6 %   Platelet Count, POC 179 142 - 424 K/uL   MPV 8.8 0 - 99.8 fL      EKG/XRAY:   Primary read interpreted by Dr. Everlene Farrier at [facility].   ASSESSMENT/PLAN: Patient has multiple physicians looking after him. He is behind on his colonoscopy and this was rescheduled. He has not seen Dr. Brett Fairy recently and I put in a referral to her. He is currently in a investigational study regarding his diabetes through Dr. Christeen Douglas office. Flu shot was given today. He is going to follow-up with Dr. Nyoka Cowden as his PCP.I  personally performed the services described in this documentation, which was scribed in my presence. The recorded information has been reviewed and is accurate.  Gross sideeffects, risk and benefits, and alternatives of medications d/w patient. Patient is aware that all medications have potential sideeffects and we are unable to predict every sideeffect or drug-drug interaction that may occur.  Arlyss Queen MD 10/16/2015 10:21 AM

## 2015-10-16 NOTE — Patient Instructions (Addendum)
IF you received an x-ray today, you will receive an invoice from [facility]. Please contact [facility] at [phone] with questions or concerns regarding your invoice.   IF you received labwork today, you will receive an invoice from [employer]. Please contact [facility] at [phone] with questions or concerns regarding your invoice.   Our billing staff will not be able to assist you with questions regarding bills from these companies.  You will be contacted with the lab results as soon as they are available. The fastest way to get your results is to activate your My Chart account. Instructions are located on the last page of this paperwork. If you have not heard from [place] regarding the results in 2 weeks, please contact this office.    Health Maintenance, Male A healthy lifestyle and preventative care can promote health and wellness.  Maintain regular health, dental, and eye exams.  Eat a healthy diet. Foods like vegetables, fruits, whole grains, low-fat dairy products, and lean protein foods contain the nutrients you need and are low in calories. Decrease your intake of foods high in solid fats, added sugars, and salt. Get information about a proper diet from your health care provider, if necessary.  Regular physical exercise is one of the most important things you can do for your health. Most adults should get at least 150 minutes of moderate-intensity exercise (any activity that increases your heart rate and causes you to sweat) each week. In addition, most adults need muscle-strengthening exercises on 2 or more days a week.   Maintain a healthy weight. The body mass index (BMI) is a screening tool to identify possible weight problems. It provides an estimate of body fat based on height and weight. Your health care provider can find your BMI and can help you achieve or maintain a healthy weight. For males 20 years and older:  A BMI  below 18.5 is considered underweight.  A BMI of 18.5 to 24.9 is normal.  A BMI of 25 to 29.9 is considered overweight.  A BMI of 30 and above is considered obese.  Maintain normal blood lipids and cholesterol by exercising and minimizing your intake of saturated fat. Eat a balanced diet with plenty of fruits and vegetables. Blood tests for lipids and cholesterol should begin at age 15 and be repeated every 5 years. If your lipid or cholesterol levels are high, you are over age 68, or you are at high risk for heart disease, you may need your cholesterol levels checked more frequently.Ongoing high lipid and cholesterol levels should be treated with medicines if diet and exercise are not working.  If you smoke, find out from your health care provider how to quit. If you do not use tobacco, do not start.  Lung cancer screening is recommended for adults aged 41-80 years who are at high risk for developing lung cancer because of a history of smoking. A yearly low-dose CT scan of the lungs is recommended for people who have at least a 30-pack-year history of smoking and are current smokers or have quit within the past 15 years. A pack year of smoking is smoking an average of 1 pack of cigarettes a day for 1 year (for example, a 30-pack-year history of smoking could mean smoking 1 pack a day for 30 years or 2 packs a day for 15 years). Yearly screening should continue until the smoker has stopped smoking for at least 15 years. Yearly screening should be stopped  for people who develop a health problem that would prevent them from having lung cancer treatment.  If you choose to drink alcohol, do not have more than 2 drinks per day. One drink is considered to be 12 oz (360 mL) of beer, 5 oz (150 mL) of wine, or 1.5 oz (45 mL) of liquor.  Avoid the use of street drugs. Do not share needles with anyone. Ask for help if you need support or instructions about stopping the use of drugs.  High blood pressure  causes heart disease and increases the risk of stroke. High blood pressure is more likely to develop in:  People who have blood pressure in the end of the normal range (100-139/85-89 mm Hg).  People who are overweight or obese.  People who are African American.  If you are 41-92 years of age, have your blood pressure checked every 3-5 years. If you are 61 years of age or older, have your blood pressure checked every year. You should have your blood pressure measured twice--once when you are at a hospital or clinic, and once when you are not at a hospital or clinic. Record the average of the two measurements. To check your blood pressure when you are not at a hospital or clinic, you can use:  An automated blood pressure machine at a pharmacy.  A home blood pressure monitor.  If you are 64-45 years old, ask your health care provider if you should take aspirin to prevent heart disease.  Diabetes screening involves taking a blood sample to check your fasting blood sugar level. This should be done once every 3 years after age 43 if you are at a normal weight and without risk factors for diabetes. Testing should be considered at a younger age or be carried out more frequently if you are overweight and have at least 1 risk factor for diabetes.  Colorectal cancer can be detected and often prevented. Most routine colorectal cancer screening begins at the age of 65 and continues through age 11. However, your health care provider may recommend screening at an earlier age if you have risk factors for colon cancer. On a yearly basis, your health care provider may provide home test kits to check for hidden blood in the stool. A small camera at the end of a tube may be used to directly examine the colon (sigmoidoscopy or colonoscopy) to detect the earliest forms of colorectal cancer. Talk to your health care provider about this at age 20 when routine screening begins. A direct exam of the colon should be repeated  every 5-10 years through age 23, unless early forms of precancerous polyps or small growths are found.  People who are at an increased risk for hepatitis B should be screened for this virus. You are considered at high risk for hepatitis B if:  You were born in a country where hepatitis B occurs often. Talk with your health care provider about which countries are considered high risk.  Your parents were born in a high-risk country and you have not received a shot to protect against hepatitis B (hepatitis B vaccine).  You have HIV or AIDS.  You use needles to inject street drugs.  You live with, or have sex with, someone who has hepatitis B.  You are a man who has sex with other men (MSM).  You get hemodialysis treatment.  You take certain medicines for conditions like cancer, organ transplantation, and autoimmune conditions.  Hepatitis C blood testing is recommended for  all people born from [birth year] through 1967 and any individual with known risk factors for hepatitis C.  Healthy men should no longer receive prostate-specific antigen (PSA) blood tests as part of routine cancer screening. Talk to your health care provider about prostate cancer screening.  Testicular cancer screening is not recommended for adolescents or adult males who have no symptoms. Screening includes self-exam, a health care provider exam, and other screening tests. Consult with your health care provider about any symptoms you have or any concerns you have about testicular cancer.  Practice safe sex. Use condoms and avoid high-risk sexual practices to reduce the spread of sexually transmitted infections (STIs).  You should be screened for STIs, including gonorrhea and chlamydia if:  You are sexually active and are younger than 24 years.  You are older than 24 years, and your health care provider tells you that you are at risk for this type of infection.  Your sexual activity has changed since you were last screened,  and you are at an increased risk for chlamydia or gonorrhea. Ask your health care provider if you are at risk.  If you are at risk of being infected with HIV, it is recommended that you take a prescription medicine daily to prevent HIV infection. This is called pre-exposure prophylaxis (PrEP). You are considered at risk if:  You are a man who has sex with other men (MSM).  You are a heterosexual man who is sexually active with multiple partners.  You take drugs by injection.  You are sexually active with a partner who has HIV.  Talk with your health care provider about whether you are at high risk of being infected with HIV. If you choose to begin PrEP, you should first be tested for HIV. You should then be tested every 3 months for as long as you are taking PrEP.  Use sunscreen. Apply sunscreen liberally and repeatedly throughout the day. You should seek shade when your shadow is shorter than you. Protect yourself by wearing long sleeves, pants, a wide-brimmed hat, and sunglasses year round whenever you are outdoors.  Tell your health care provider of new moles or changes in moles, especially if there is a change in shape or color. Also, tell your health care provider if a mole is larger than the size of a pencil eraser.  A one-time screening for abdominal aortic aneurysm (AAA) and surgical repair of large AAAs by ultrasound is recommended for men aged 82-75 years who are current or former smokers.  Stay current with your vaccines (immunizations).   This information is not intended to replace advice given to you by your health care provider. Make sure you discuss any questions you have with your health care provider.   Document Released: 08/03/2007 Document Revised: 02/25/2014 Document Reviewed: 07/02/2010 Elsevier Interactive Patient Education [employer].

## 2015-10-17 LAB — HEPATITIS C ANTIBODY: HCV Ab: NEGATIVE

## 2015-10-30 ENCOUNTER — Other Ambulatory Visit: Payer: Self-pay | Admitting: Emergency Medicine

## 2015-11-07 ENCOUNTER — Institutional Professional Consult (permissible substitution): Payer: PRIVATE HEALTH INSURANCE | Admitting: Neurology

## 2015-11-09 ENCOUNTER — Telehealth: Payer: Self-pay | Admitting: Cardiology

## 2015-11-09 MED ORDER — OMEPRAZOLE 20 MG PO CPDR: 20.0000 mg | DELAYED_RELEASE_CAPSULE | Freq: Every day | ORAL | 11 refills | Status: DC

## 2015-11-09 NOTE — Telephone Encounter (Signed)
New message     Pt needs nurse to call him concerning rx's. Pt needs to confirm Rx's. Please call.

## 2015-11-09 NOTE — Telephone Encounter (Signed)
Returned patient call-pt requesting to be switched from Nexium to Prilosec because he can get it for free through [employer].  Also states he can get his lisinopril for free as well.  If change approved by MD Ellyn Hack patient needs paper prescriptions faxed to [phone] Attention: Cheri Guppy (nurse).

## 2015-11-13 NOTE — Telephone Encounter (Signed)
I don't see that this call was routed - sent to MD for review.

## 2015-11-14 NOTE — Telephone Encounter (Signed)
I'm fine with the changes  Harold Hew, MD

## 2015-11-15 MED ORDER — LISINOPRIL 20 MG PO TABS: 20.0000 mg | ORAL_TABLET | Freq: Every day | ORAL | 3 refills | Status: DC

## 2015-11-15 MED ORDER — ESOMEPRAZOLE MAGNESIUM 20 MG PO CPDR: 20.0000 mg | DELAYED_RELEASE_CAPSULE | Freq: Every day | ORAL | 3 refills | Status: DC

## 2015-11-15 NOTE — Telephone Encounter (Signed)
Made changes w dose equivalences. Printed and stamped and faxed Rx(s) to provided number. Left msg for patient on voice mail informing of this.

## 2015-11-22 ENCOUNTER — Other Ambulatory Visit: Payer: Self-pay | Admitting: Cardiology

## 2015-11-22 MED ORDER — LISINOPRIL 20 MG PO TABS: 20.0000 mg | ORAL_TABLET | Freq: Two times a day (BID) | ORAL | 3 refills | Status: DC

## 2015-11-22 NOTE — Progress Notes (Signed)
Neurologist increased to 23m BID  DGlenetta Hew MD

## 2015-11-23 ENCOUNTER — Institutional Professional Consult (permissible substitution): Payer: PRIVATE HEALTH INSURANCE | Admitting: Neurology

## 2015-12-05 ENCOUNTER — Ambulatory Visit: Payer: No Typology Code available for payment source | Admitting: Cardiology

## 2015-12-11 ENCOUNTER — Institutional Professional Consult (permissible substitution): Payer: PRIVATE HEALTH INSURANCE | Admitting: Neurology

## 2016-01-22 ENCOUNTER — Ambulatory Visit: Payer: Self-pay | Admitting: *Deleted

## 2016-01-22 VITALS — BP 110/62 | Wt 320.0 lb

## 2016-01-22 DIAGNOSIS — Z0130 Encounter for examination of blood pressure without abnormal findings: Secondary | ICD-10-CM

## 2016-01-22 NOTE — Progress Notes (Signed)
Pt in for weekly BP and Wt check. CBG's at home ranging high 200s-low 300's with some outlying 400's. Most concerned with Dx of COPD which per review of chart appears to have been made after PFTs 2 years ago but he reports only being made aware of recently. C/o increased ShOB over past 2 months or so and a dry, non-productive cough. Increased lethargy, sleeping 10-12 hours/night. Denies worsening orthopnea, worsening LE edema. Taking Lasix as Rx'd.  Reports he cannot financially afford to see multiple specialists that he is due to see right now until after the first of the year. Suggested he at least try to see his PCP for evaluation as most of his complaints have worsened in the last 1-2 months. He is agreeable to this.  BP stable and WNL on check. 1# wt loss compared to last week. Currently 320#.

## 2016-02-05 ENCOUNTER — Ambulatory Visit: Payer: Self-pay | Admitting: *Deleted

## 2016-02-05 VITALS — BP 142/62 | Wt 320.0 lb

## 2016-02-05 DIAGNOSIS — Z794 Long term (current) use of insulin: Principal | ICD-10-CM

## 2016-02-05 DIAGNOSIS — E118 Type 2 diabetes mellitus with unspecified complications: Secondary | ICD-10-CM

## 2016-02-05 LAB — GLUCOSE, POCT (MANUAL RESULT ENTRY): POC Glucose: 526 mg/dl — AB (ref 70–99)

## 2016-02-05 NOTE — Progress Notes (Signed)
Pt in for weekly BP/ Wt check. Reports he has been "slack" on DM management this week due to stress. Reports "sometimes it just gets to be too much to take care of." Discussed adverse effects of noncompliance and missing doses. He sts he is aware and will try to do better this week. Did have an episode of n/v x2 hours after weekly Trulicity dose this past Thursday. Believes it was related to eating greasy food and taking medication right after meal. Encouraged to call his DM medication coordinator with any questions or concerns regarding medications. He verbalized he would if it happened again.  He has not been checking his blood sugars at home this week. Last week were running 300-400's again. Had 2 sausage, egg cheese biscuits this am and did not take insulin. CBG in clinic 526. Reports he is going to take insulin at lunch at 2pm.  Reinforced need for close management to prevent larger issues from noncompliance. He verbalizes understanding and agreement.

## 2016-02-11 IMAGING — CR DG CERVICAL SPINE COMPLETE 4+V
7 series · 7 of 7 positions shown · non-contrast
Comparison: None.

CLINICAL DATA: 58-year-old male with numbness and tingling in the
right hand. Initial encounter.

EXAM:
CERVICAL SPINE  4+ VIEWS

[lateral]
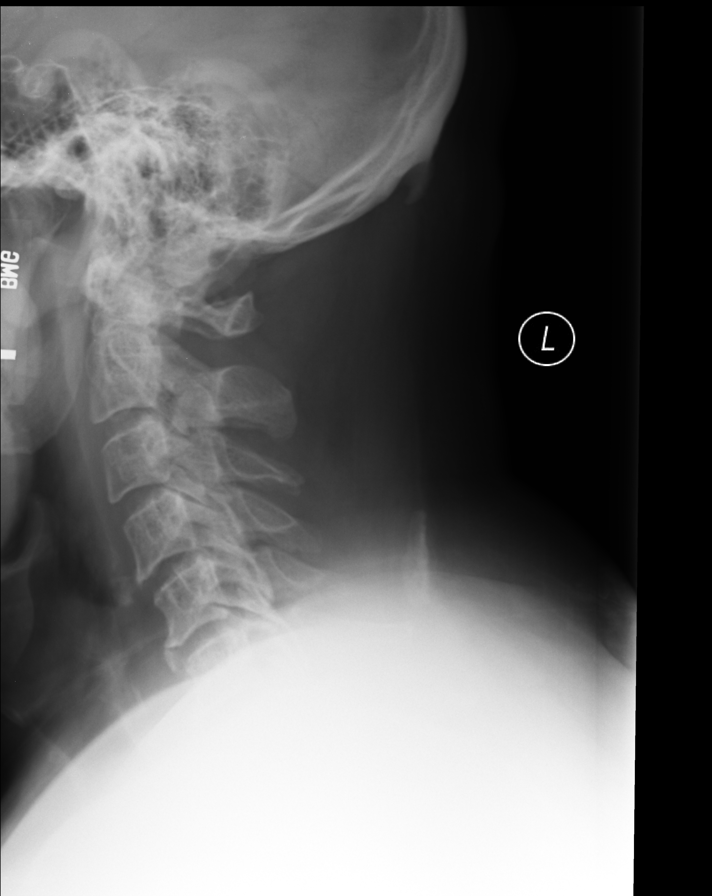

[ap open mouth]
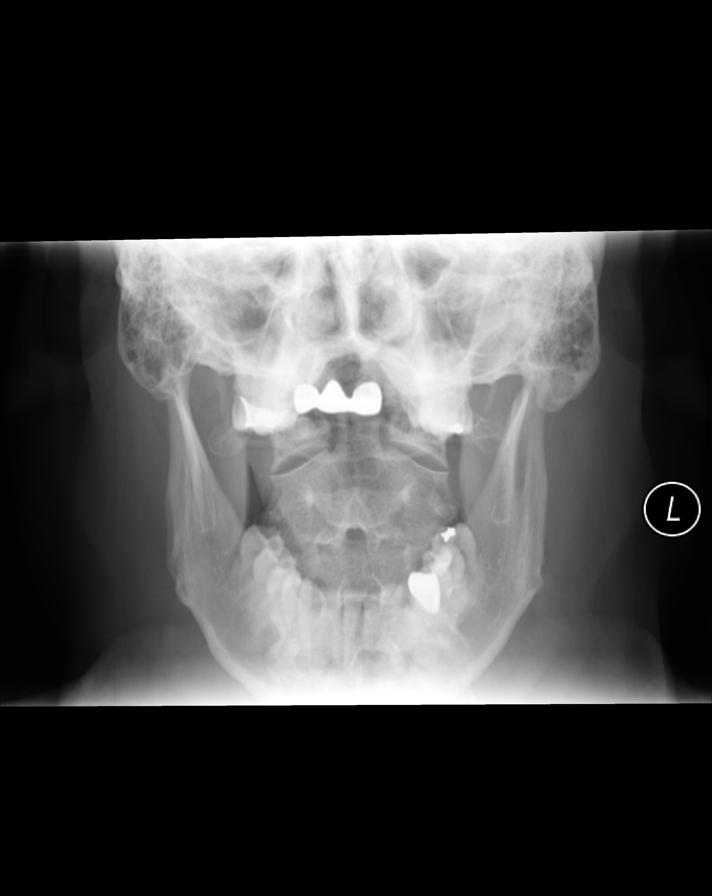

[AP (1 of 2)]
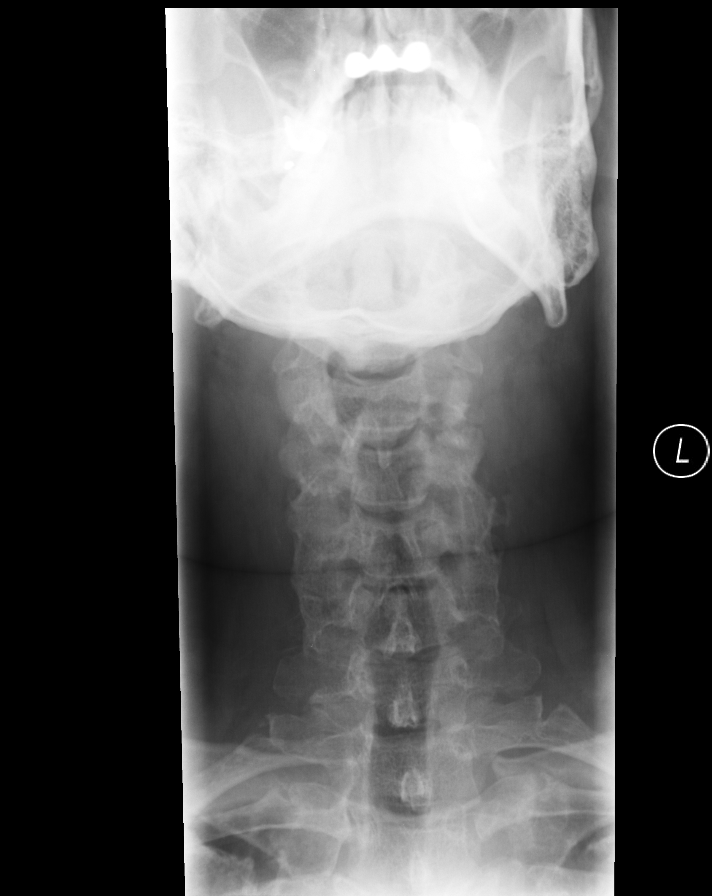

[lpo]
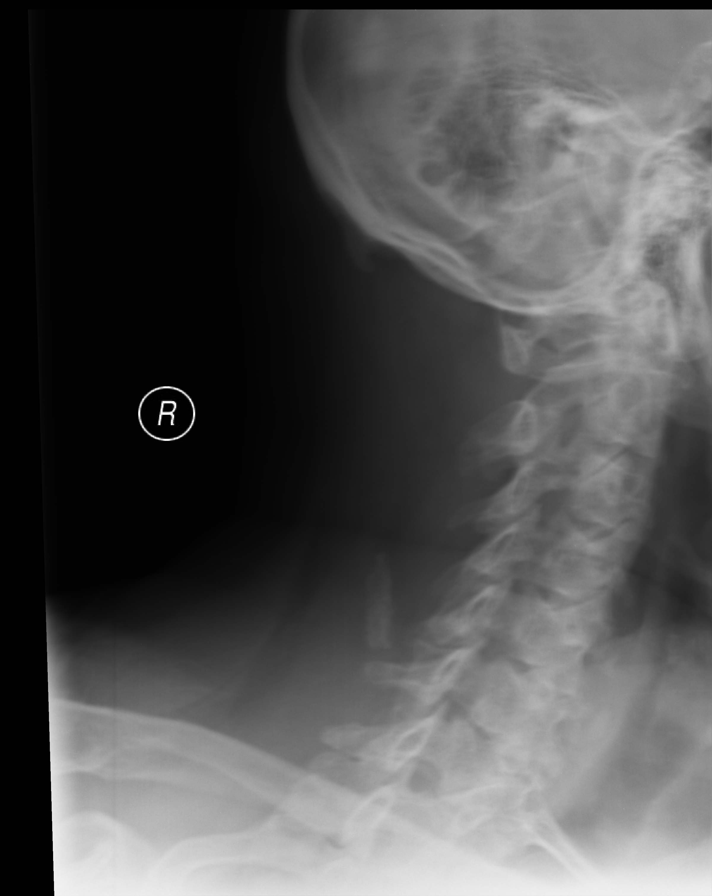

[rpo]
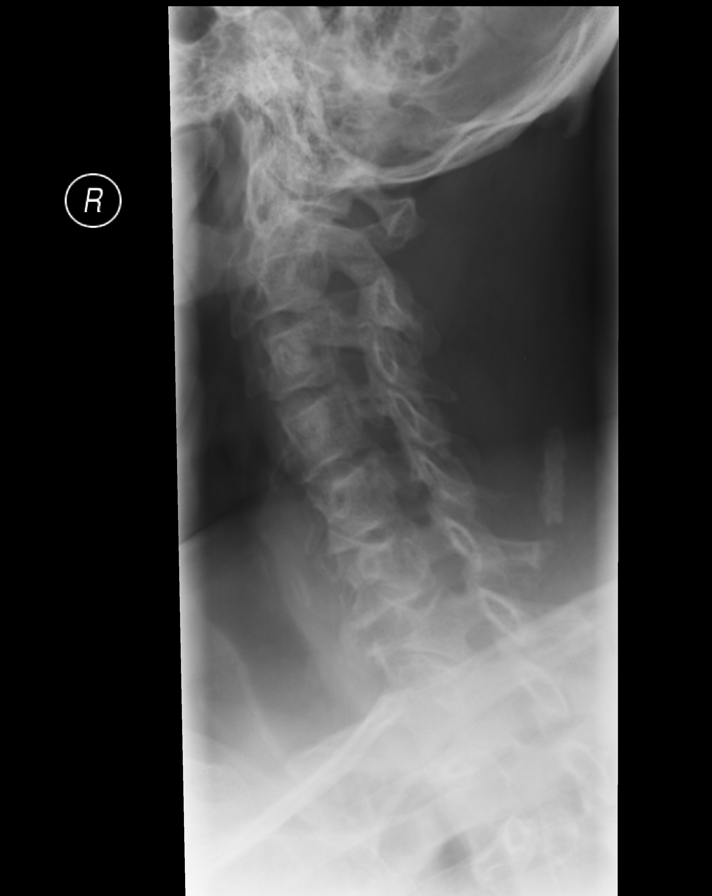

[swimmers]
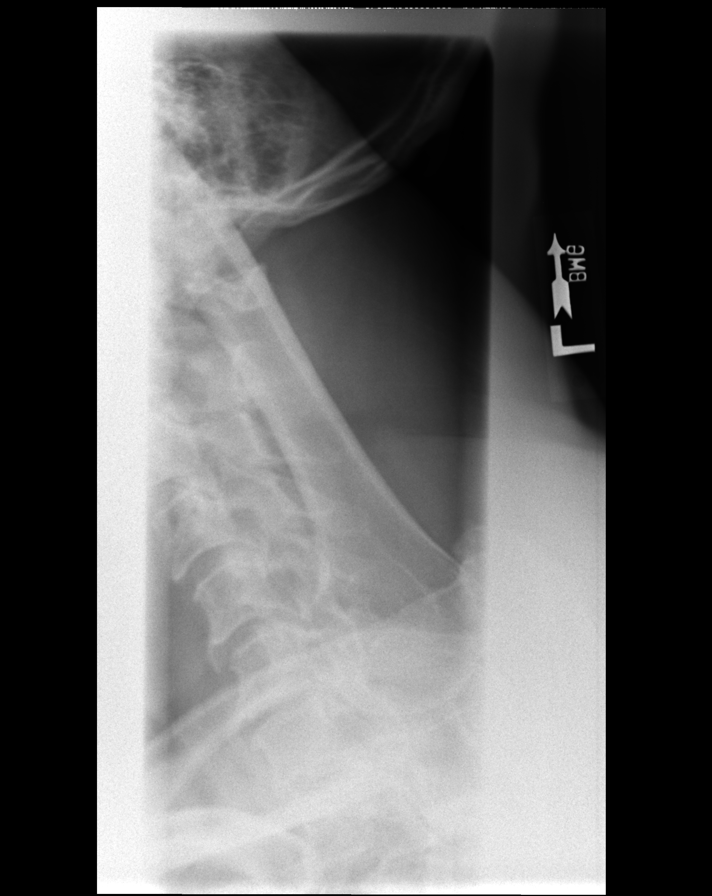

[AP (2 of 2)]
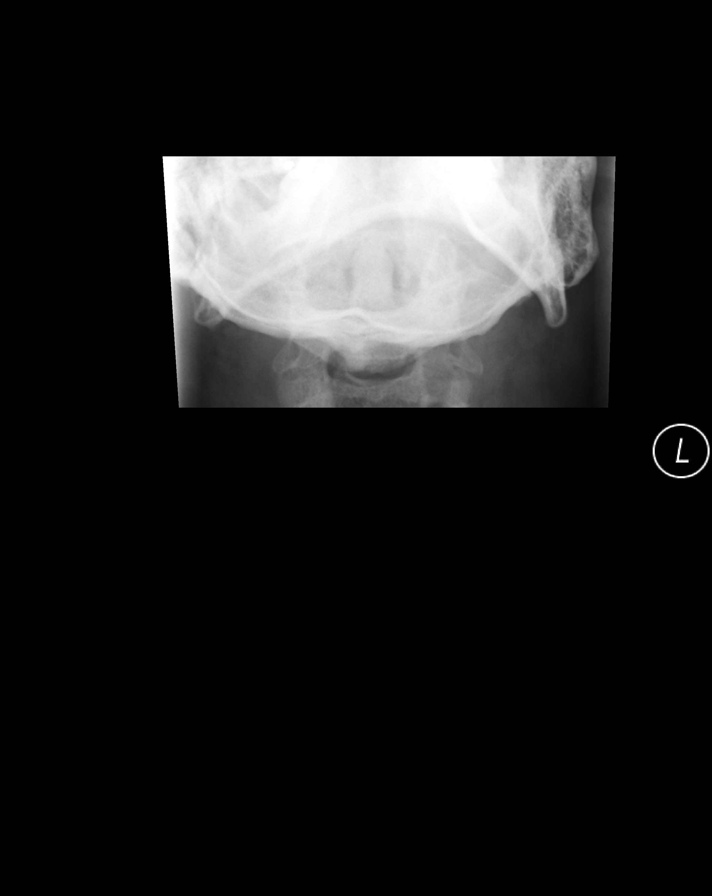

[7 of 7 positions shown; findings below may reference images not displayed]

FINDINGS: Straightening of cervical lordosis. Lower cervical endplate spurring
at multiple levels. C5-C6 and C6-C7 disc space loss. Cervicothoracic
junction alignment is within normal limits. AP alignment within
normal limits. C1-C2 alignment within normal limits. Bilateral
posterior element alignment is within normal limits. Degenerative
posterior nuchal calcification.
IMPRESSION: No acute osseous abnormality identified. In the cervical spine.
C5-C6 and C6-C7 disc degeneration.

## 2016-02-13 ENCOUNTER — Ambulatory Visit: Payer: Self-pay | Admitting: *Deleted

## 2016-02-13 VITALS — BP 144/66 | Wt 325.0 lb

## 2016-02-13 DIAGNOSIS — I10 Essential (primary) hypertension: Secondary | ICD-10-CM

## 2016-02-13 DIAGNOSIS — I509 Heart failure, unspecified: Secondary | ICD-10-CM

## 2016-02-13 NOTE — Progress Notes (Signed)
Pt in for weekly BP and weight check. Reports being more ShOB than normal. Denies chest pain, diaphoresis. 5# wt gain in 1 week. BP higher than normal (110'/60's). Reports eating [place], dressing, etc. over the holidays. Taking meds as Rx'd. Has taken meds today. Denies LE edema. With increased ShOB, sx could be r/t CHF exacerbation. With NP approval, recommended doubling Lasix for 1-2 days. If sx not improved, or worsen, pt to call PCP. ER precautions also given. Pt plans to make all necessary PCP and specialists f/u appts this week for Jan/Feb. Pt verbalizes understanding and agreement with plan of care. Pt not working remainder of week. Will f/u with RN or NP next week.

## 2016-02-22 ENCOUNTER — Telehealth: Payer: Self-pay | Admitting: Cardiology

## 2016-02-22 NOTE — Telephone Encounter (Signed)
Received records from [facility] for appointment on 03/29/16 with Dr Ellyn Hack.  Records put with Dr Allison Quarry schedule for 03/29/16. lp

## 2016-02-27 ENCOUNTER — Other Ambulatory Visit (HOSPITAL_COMMUNITY): Payer: Self-pay | Admitting: Respiratory Therapy

## 2016-02-27 DIAGNOSIS — J449 Chronic obstructive pulmonary disease, unspecified: Secondary | ICD-10-CM

## 2016-03-04 ENCOUNTER — Ambulatory Visit: Payer: Self-pay | Admitting: *Deleted

## 2016-03-04 VITALS — BP 130/60 | Wt 329.0 lb

## 2016-03-04 NOTE — Progress Notes (Signed)
Weekly BP and Wt check. New updated med list brought in. Med List reconciled. Multiple upcoming appts with specialists, Pulm, Opthalm, GI, Cardio, Endocrine. Will update RN with any changes as needed. Copy of BP log from November 2017 to current sent to Dr. Jimmy Footman at pt request.

## 2016-03-07 ENCOUNTER — Encounter: Payer: Self-pay | Admitting: Family

## 2016-03-11 ENCOUNTER — Ambulatory Visit: Payer: Self-pay | Admitting: *Deleted

## 2016-03-11 ENCOUNTER — Other Ambulatory Visit: Payer: Self-pay | Admitting: Emergency Medicine

## 2016-03-11 ENCOUNTER — Other Ambulatory Visit: Payer: Self-pay | Admitting: Cardiology

## 2016-03-11 VITALS — BP 134/60 | Wt 327.0 lb

## 2016-03-14 ENCOUNTER — Ambulatory Visit (HOSPITAL_COMMUNITY)
Admission: RE | Admit: 2016-03-14 | Discharge: 2016-03-14 | Disposition: A | Discharge status: Home / Self Care | Payer: No Typology Code available for payment source | Source: Ambulatory Visit | Attending: Family | Admitting: Family

## 2016-03-14 ENCOUNTER — Ambulatory Visit (INDEPENDENT_AMBULATORY_CARE_PROVIDER_SITE_OTHER): Payer: No Typology Code available for payment source | Admitting: Family

## 2016-03-14 ENCOUNTER — Encounter: Payer: Self-pay | Admitting: Family

## 2016-03-14 ENCOUNTER — Other Ambulatory Visit: Payer: Self-pay | Admitting: Emergency Medicine

## 2016-03-14 ENCOUNTER — Ambulatory Visit (HOSPITAL_COMMUNITY)
Admission: RE | Admit: 2016-03-14 | Discharge: 2016-03-14 | Disposition: A | Discharge status: Home / Self Care | Payer: No Typology Code available for payment source | Source: Ambulatory Visit | Attending: Internal Medicine | Admitting: Internal Medicine

## 2016-03-14 VITALS — BP 109/68 | HR 60 | Temp 98.4°F | Resp 20 | Ht 73.0 in | Wt 327.0 lb

## 2016-03-14 DIAGNOSIS — Z48812 Encounter for surgical aftercare following surgery on the circulatory system: Secondary | ICD-10-CM

## 2016-03-14 DIAGNOSIS — Z87891 Personal history of nicotine dependence: Secondary | ICD-10-CM

## 2016-03-14 DIAGNOSIS — I70213 Atherosclerosis of native arteries of extremities with intermittent claudication, bilateral legs: Secondary | ICD-10-CM

## 2016-03-14 DIAGNOSIS — I6523 Occlusion and stenosis of bilateral carotid arteries: Secondary | ICD-10-CM

## 2016-03-14 DIAGNOSIS — E1151 Type 2 diabetes mellitus with diabetic peripheral angiopathy without gangrene: Secondary | ICD-10-CM

## 2016-03-14 DIAGNOSIS — J449 Chronic obstructive pulmonary disease, unspecified: Secondary | ICD-10-CM

## 2016-03-14 LAB — SPIROMETRY WITH GRAPH
FEF 25-75 POST: 2.18 L/s
FEF 25-75 PRE: 1.88 L/s
FEF2575-%Change-Post: 15 %
FEF2575-%PRED-POST: 69 %
FEF2575-%PRED-PRE: 59 %
FEV1-%Change-Post: 3 %
FEV1-%PRED-POST: 62 %
FEV1-%Pred-Pre: 60 %
FEV1-POST: 2.45 L
FEV1-Pre: 2.38 L
FEV1FVC-%CHANGE-POST: 0 %
FEV1FVC-%PRED-PRE: 100 %
FEV6-%CHANGE-POST: 4 %
FEV6-%PRED-POST: 65 %
FEV6-%Pred-Pre: 63 %
FEV6-Post: 3.26 L
FEV6-Pre: 3.13 L
FEV6FVC-%CHANGE-POST: 0 %
FEV6FVC-%Pred-Post: 105 %
FEV6FVC-%Pred-Pre: 105 %
FVC-%CHANGE-POST: 3 %
FVC-%Pred-Post: 62 %
FVC-%Pred-Pre: 60 %
FVC-Post: 3.26 L
FVC-Pre: 3.16 L
POST FEV1/FVC RATIO: 75 %
PRE FEV1/FVC RATIO: 75 %
Post FEV6/FVC ratio: 100 %
Pre FEV6/FVC Ratio: 100 %

## 2016-03-14 MED ORDER — ALBUTEROL SULFATE (2.5 MG/3ML) 0.083% IN NEBU
2.5000 mg | INHALATION_SOLUTION | Freq: Once | RESPIRATORY_TRACT | Status: AC
  Administered 2016-03-14: 2.5 mg via RESPIRATORY_TRACT

## 2016-03-14 NOTE — Progress Notes (Signed)
Chief Complaint: Follow up Extracranial Carotid Artery Stenosis   History of Present Illness  Harold [name] is a 61 y.o. male patient of Dr. Oneida Alar who presented for evaluation in Sept., 2013 of a moderate carotid stenosis as well as bilateral lower extremity pain. The patient previously had a right carotid endarterectomy in 2005 by Dr. Oneida Alar.  On 11/15/11 he underwent an aortogram with bilateral run-off by Dr. Oneida Alar, findings were mild diffuse tibial disease with no significant aortoiliac superficial femoral popliteal disease, no intervention necessary. He was counseled by Dr. Oneida Alar at that time re management of his DM and weight loss.  He had 6 cardiac stents placed by Dr. Ellyn Hack in March, 2014, he denies having an MI. Dr. Ellyn Hack is his cardiologist.  He develops pain and weakness in both calves after walking about 100 feet, which resolves; this has been gradually worsening since 2016.  He has had 4 lumbar spine surgeries, Dr. Marylyn Ishihara, [facility]. Pt has low back pain that radiates to below both knees when he walks, states his walking is limited by his back pain.  He denies non-healing wounds.  He denies history of stroke or TIA symptoms. Uses his C-PAP nightly for OSA. He had a right trigger finger surgery and right foot bunionectomy in 2015.  Pt Diabetic: Yes, states not in good control but is improving from 11.0 to 10.4, is working closely with Dr. Shelia Media to improve this. Review of records: A1C on  Pt states he was told by his PCP that he is extremely insulin resistant.  Pt smoker: former smoker, quit in 2005  Pt meds include: Statin :no, he will be starting study medications for this Betablocker: Yes ASA: no Other anticoagulants/antiplatelets: Brilinta for CAD   Past Medical History:  Diagnosis Date  . Anginal pain [facility]) March 2015   Cardiac cath showed patent stents with distal LAD, circumflex-OM and RCA disease in small vessels.  . Anxiety   . CAD S/P  percutaneous coronary angioplasty 2003, 04/2012   status post PCI to LAD, circumflex-OM 2, RCA  . Carotid artery occlusion   . Chronic renal insufficiency, stage II (mild)   . Chronic venous insufficiency    varicosities, no reflux; dopplers 04/14/12- valvular insufficiency in the R and L GSV  . Complication of anesthesia   . Depression    situaltional   . Diabetes mellitus   . Dyslipidemia associated with type 2 diabetes mellitus ([place])   . GERD (gastroesophageal reflux disease)   . HTN (hypertension)   . Hypothyroidism   . Neuromuscular disorder (HCC)    neuropathy in feet  . Neuropathy ([place])    notably improved following PCI with improved cardiac function  . Obesity   . 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 PONV (postoperative nausea and vomiting)    "[place]"  . Pulmonary hypertension 04/2012   PA pressure 27mhg  . Sleep apnea    uses nightly    Social History Social History  Substance Use Topics  . Smoking status: Former Smoker    Packs/day: 1.00    Years: 42.00    Types: Cigarettes    Quit date: 03/23/2003  . Smokeless tobacco: Never Used  . Alcohol use No    Family History Family History  Problem Relation Age of Onset  . Adopted: Yes  . Family history unknown: Yes    Surgical History Past Surgical History:  Procedure Laterality Date  . ABDOMINAL AORTAGRAM N/A 11/15/2011   Procedure: ABDOMINAL Maxcine Ham;  Surgeon: Elam Dutch, MD;  Location: [facility] CATH LAB;  Service: Cardiovascular;  [name];  . ABIs  04/27/2012   mild bilateral arterial insufficiency  . BACK SURGERY  2005 x1   X2-2010  . BUNIONECTOMY Right 11/11/2013   Procedure: RIGHT FOOT SILVER BUNIONECTOMY;  Surgeon: Wylene Simmer, MD;  Location: [facility];  Service: Orthopedics;  Laterality: Right;  . CARDIAC CATHETERIZATION  12/11/2001   significant 3V CAD, normal LV function  . CARDIAC  CATHETERIZATION  March 2015   Moderate Pulm HTN: 46/16 - mean 33 mmHg; PCWP 3mHg;; [name], widely patent proximal and mid cervical extends with previous subtotal occlusion of smaller tube, I be patent RCA ostial mid and distal stents with 70% distal continuation RCA disease  . Carotid Doppler  04/27/2012   right internal carotid: Elevated velocities but no evidence of plaque. Left internal carotid 40-59%  . CAROTID ENDARTERECTOMY  2005   Right; recent carotid Dopplers notes elevated velocities.   . colonscopy    . CORONARY ANGIOPLASTY  12/21/2001   PTCA of the distal and mid AV groove circ, unsuccessful PTCA of second OM total occlusion, unsuccessful PTCA of the apical LAD total occlusion  . CORONARY ANGIOPLASTY WITH STENT PLACEMENT  04/29/12   PCI to 3 RCA lesions, Promus Premiere 2.2169m8mm distally, mid was 2.69m76mmm and proximally 2.75x16m52mF 55-60%  . CORONARY STENT PLACEMENT  04/28/12   PCI to LAD (3x23mm10mnce DES postdilated to 3.25) and circ prox and mid (2 overlappinmg 2.69mmx166mXience DES postdilated to 2.769mm) 80mOPPLER ECHOCARDIOGRAPHY  04/28/2012   poor quality study: EF estimated 60-65%; unable to assess diastolic function (previously noted to have diastolic dysfunction); severely dilated left atrium and mild right atrium; dilated IVC consistent with increased central venous pressure..  . HERNIA REPAIR    . LEFT AND RIGHT HEART CATHETERIZATION WITH CORONARY ANGIOGRAM N/A 04/27/2012   Procedure: LEFT AND RIGHT HEART CATHETERIZATION WITH CORONARY ANGIOGRAM;  Surgeon: David WLeonie Man[facility];  Service: Cardiovascular;  [name];  . LEFT AND RIGHT HEART CATHETERIZATION WITH CORONARY ANGIOGRAM N/A 05/14/2013   Procedure: LEFT AND RIGHT HEART CATHETERIZATION WITH CORONARY ANGIOGRAM;  Surgeon: Thomas Troy SineLocation[facility];  Service: Cardiovascular;  [name];  . LUMBAR  LAMINECTOMY/DECOMPRESSION MICRODISCECTOMY  01/07/2012   Procedure: LUMBAR LAMINECTOMY/DECOMPRESSION MICRODISCECTOMY 1 LEVEL;  Surgeon: Kyle L Winfield CunasLocation: [facility];  Service: Neurosurgery;  Laterality: Bilateral;   Lumbar Three-Four Decompression  . PERCUTANEOUS CORONARY STENT INTERVENTION (PCI-S) N/A 04/28/2012   Procedure: PERCUTANEOUS CORONARY STENT INTERVENTION (PCI-S);  Surgeon: Thomas Troy SineLocation[facility];  Service: Cardiovascular;  [name];  . PERCUTANEOUS CORONARY STENT INTERVENTION (PCI-S) N/A 04/29/2012   Procedure: PERCUTANEOUS CORONARY STENT INTERVENTION (PCI-S);  Surgeon: David WLeonie ManLocation[facility];  Service: Cardiovascular;  [name];  . SPINE SURGERY    . UMBILICAL HERNIA REPAIR  2009   steel mesh insert    Allergies  Allergen Reactions  . Septra [Bactrim] Itching  . Penicillins Hives    Allergy to All cillin drugs  Ancef given 9/24 with no obvious reaction  . Clopidogrel     NON RESPONDER  P2Y12  . Metformin And Related   . Other     Walnuts, pecans  Current Outpatient Prescriptions  Medication Sig Dispense Refill  . benzonatate (TESSALON) 200 MG capsule Take 200 mg by mouth 3 (three) times daily as needed for cough.    . BRILINTA 90 MG TABS tablet TAKE 1 TABLET BY MOUTH 2 TIMES DAILY. KEEP OFFFICE VISIT. 60 tablet 4  . carvedilol (COREG) 6.25 MG tablet TAKE 1 TABLET (6.25 MG TOTAL) BY MOUTH 2 (TWO) TIMES DAILY. 60 tablet 9  . escitalopram (LEXAPRO) 20 MG tablet TAKE 1 TABLET (20 MG TOTAL) BY MOUTH DAILY. 30 tablet 0  . esomeprazole (NEXIUM) 20 MG capsule Take 1 capsule (20 mg total) by mouth daily at 12 noon. (Patient not taking: Reported on 03/04/2016) 90 capsule 3  . felodipine (PLENDIL) 10 MG 24 hr tablet TAKE 1 TABLET BY MOUTH DAILY. (Patient not taking: Reported on 03/04/2016) 30 tablet 11  . furosemide (LASIX) 40 MG tablet Take 1 tablet (40 mg total) by mouth daily. (Patient not taking: Reported on  03/04/2016) 30 tablet 4  . furosemide (LASIX) 80 MG tablet Take 80 mg by mouth daily.  6  . insulin aspart (NOVOLOG) 100 UNIT/ML injection Inject 34 Units into the skin 3 (three) times daily with meals. IN ADDITION 25 MG ONCE ADAY    . Insulin Detemir (LEVEMIR) 100 UNIT/ML Pen Inject 54 Units into the skin 2 (two) times daily. 75 units twice a day     . levothyroxine (SYNTHROID, LEVOTHROID) 88 MCG tablet Take 88 mcg by mouth daily before breakfast.    . losartan (COZAAR) 100 MG tablet Take 100 mg by mouth daily.    Marland Kitchen NITROSTAT 0.4 MG SL tablet PLACE 1 TAB UNDER THE TONGUE EVERY 5 MINUTES AS NEEDED FOR CHEST PAIN (SEVERE PRESSURE OR TIGHTNESS) 25 tablet 3  . TRULICITY 1.5 CH/8.5ID SOPN Inject 1.5 mg into the skin once a week.  5   No current facility-administered medications for this visit.     Review of Systems : See HPI for pertinent positives and negatives.  Physical Examination  Vitals:   03/14/16 1337 03/14/16 1340  BP: 136/69 109/68  Pulse: 60   Resp: 20   Temp: 98.4 F (36.9 C)   TempSrc: Oral   SpO2: 95%   Weight: (!) 327 lb (148.3 kg)   Height: _0  (1.854 m)    Body mass index is 43.14 kg/m.  General: Morbidly obese male in NAD Gait: Normal HENT: WNL Eyes: Pupils equal and react to light accomodation. Pulmonary: normal non-labored breathing, no rales, rhonchi,or wheezing, breath sounds in all fields are distant. Cardiac: RRR, no detected murmur  Abdomen: soft, NT, no palpable masses, very large panus. Skin: no rashes, no ulcers, no cellulitis  VASCULAR EXAM  Carotid Bruits Left Right   Negative Negative   Aorta is not palpable. Radial pulses are 2+ and =.   VASCULAR EXAM: Extremities without ischemic changes  without Gangrene; without open wounds.     LE Pulses LEFT  RIGHT   POPLITEAL not palpable  not palpable   POSTERIOR TIBIAL  2+ palpable   2+ palpable    DORSALIS PEDIS  ANTERIOR TIBIAL 1+ palpable  1+ palpable    Musculoskeletal: no muscle wasting or atrophy. Neurologic: A&O X 3; Appropriate Affect ;  SENSATION: normal; MOTOR FUNCTION: 5/5 Symmetric, CN 2-12 intact, Speech is fluent/normal.      Assessment: Harold [name] is a 61 y.o. male who is s/p right CEA in 2005. He has no hx of stroke or TIA.  His atherosclerotic risk factors  include uncontrolled but improving DM, morbid obesity which is also improving, CAD, OSA, and former smoker.   He has developed what sounds like intermittent claudication in both calves after walking about 100 feet, resolves with rest. There are no signs of ischemia in his feet/legs and bilateral pedal pulses have 2+ PT and 1+ DP palpable pulses. 2013 aortogram showed  mild diffuse tibial disease with no significant aortoiliac superficial femoral popliteal disease. He has known significant lumbar spine issues and DM neuropathy which both could be contributing factors to bilateral calf pain with walking.  His walking is also limited by worsening dyspnea; he is undergoing testing for this.  He is losing weight on Trulicity.     DATA Today's carotid duplex suggests no significant stenosis of the bilateral CCA; greater than 50% bilateral ECA stenoses. Right CEA site with <40% stenosis. Left ICA with 40-59% proximal ICA stenosis. Bilateral vertebral artery flow is antegrade.  Bilateral subclavian artery waveforms are normal.  No significant change compared to the last exam on 03-10-15.    Plan: Follow-up in 1 year with Carotid Duplex scan and ABI's. Graduated walking program discussed and how to achieve.    I discussed in depth with the patient the nature of atherosclerosis, and emphasized the importance of maximal medical management including strict control  of blood pressure, blood glucose, and lipid levels, obtaining regular exercise, and continued cessation of smoking.  The patient is aware that without maximal medical management the underlying atherosclerotic disease process will progress, limiting the benefit of any interventions. The patient was given information about stroke prevention and what symptoms should prompt the patient to seek immediate medical care. Thank you for allowing [place] to participate in this patient's care.  Clemon Chambers, RN, MSN, FNP-C [facility] Office: [phone]  Clinic Physician: Oneida Alar  03/14/16 1:45 PM

## 2016-03-14 NOTE — Patient Instructions (Addendum)
Stroke Prevention Some medical conditions and behaviors are associated with an increased chance of having a stroke. You may prevent a stroke by making healthy choices and managing medical conditions. How can I reduce my risk of having a stroke?  Stay physically active. Get at least 30 minutes of activity on most or all days.  Do not smoke. It may also be helpful to avoid exposure to secondhand smoke.  Limit alcohol use. Moderate alcohol use is considered to be:  No more than 2 drinks per day for men.  No more than 1 drink per day for nonpregnant women.  Eat healthy foods. This involves:  Eating 5 or more servings of fruits and vegetables a day.  Making dietary changes that address high blood pressure (hypertension), high cholesterol, diabetes, or obesity.  Manage your cholesterol levels.  Making food choices that are high in fiber and low in saturated fat, trans fat, and cholesterol may control cholesterol levels.  Take any prescribed medicines to control cholesterol as directed by your health care provider.  Manage your diabetes.  Controlling your carbohydrate and sugar intake is recommended to manage diabetes.  Take any prescribed medicines to control diabetes as directed by your health care provider.  Control your hypertension.  Making food choices that are low in salt (sodium), saturated fat, trans fat, and cholesterol is recommended to manage hypertension.  Ask your health care provider if you need treatment to lower your blood pressure. Take any prescribed medicines to control hypertension as directed by your health care provider.  If you are 18-39 years of age, have your blood pressure checked every 3-5 years. If you are 40 years of age or older, have your blood pressure checked every year.  Maintain a healthy weight.  Reducing calorie intake and making food choices that are low in sodium, saturated fat, trans fat, and cholesterol are recommended to manage  weight.  Stop drug abuse.  Avoid taking birth control pills.  Talk to your health care provider about the risks of taking birth control pills if you are over 35 years old, smoke, get migraines, or have ever had a blood clot.  Get evaluated for sleep disorders (sleep apnea).  Talk to your health care provider about getting a sleep evaluation if you snore a lot or have excessive sleepiness.  Take medicines only as directed by your health care provider.  For some people, aspirin or blood thinners (anticoagulants) are helpful in reducing the risk of forming abnormal blood clots that can lead to stroke. If you have the irregular heart rhythm of atrial fibrillation, you should be on a blood thinner unless there is a good reason you cannot take them.  Understand all your medicine instructions.  Make sure that other conditions (such as anemia or atherosclerosis) are addressed. Get help right away if:  You have sudden weakness or numbness of the face, arm, or leg, especially on one side of the body.  Your face or eyelid droops to one side.  You have sudden confusion.  You have trouble speaking (aphasia) or understanding.  You have sudden trouble seeing in one or both eyes.  You have sudden trouble walking.  You have dizziness.  You have a loss of balance or coordination.  You have a sudden, severe headache with no known cause.  You have new chest pain or an irregular heartbeat. Any of these symptoms may represent a serious problem that is an emergency. Do not wait to see if the symptoms will go away.   Get medical help at once. Call your local emergency services (911 in [place] Do not drive yourself to the hospital.  This information is not intended to replace advice given to you by your health care provider. Make sure you discuss any questions you have with your health care provider. Document Released: 03/14/2004 Document Revised: 07/13/2015 Document Reviewed: 08/07/2012 Elsevier  Interactive Patient Education  2017 [employer].      Intermittent Claudication Intermittent claudication is pain in your leg that occurs when you walk or exercise and goes away when you rest. The pain can occur in one or both legs. CAUSES Intermittent claudication is caused by the buildup of plaque within the major arteries in the body (atherosclerosis). The plaque, which makes arteries stiff and narrow, prevents enough blood from reaching your leg muscles. The pain occurs when you walk or exercise because your muscles need more blood when you are moving and exercising. RISK FACTORS Risk factors include:  A family history of atherosclerosis.  A personal history of stroke or heart disease.  Older age.  Being inactive or overweight.  Smoking cigarettes.  Having another health condition such as:  Diabetes.  High blood pressure.  High cholesterol. SIGNS AND SYMPTOMS  Your hip or leg may:   Ache.  Cramp.  Feel tight.  Feel weak.  Feel heavy. Over time, you may feel pain in your calf, thigh, or hip. DIAGNOSIS  Your health care provider may diagnose intermittent claudication based on your symptoms and medical history. Your health care provider may also do tests to learn more about your condition. These may include:  Blood tests.  An ultrasound.  Imaging tests such as angiography, magnetic resonance angiography (MRA), and computed tomography angiography (CTA). TREATMENT You may be treated for problems such as:  High blood pressure.  High cholesterol.  Diabetes. Other treatments may include:  Lifestyle changes such as:  Starting an exercise program.  Losing weight.  Quitting smoking.  Medicines to help restore blood flow through your legs.  Blood vessel surgery (angioplasty) to restore blood flow if your intermittent claudication is caused by severe peripheral artery disease. HOME CARE INSTRUCTIONS  Manage any other health conditions you  have.  Eat a diet low in saturated fats and calories to maintain a healthy weight.  Quit smoking, if you smoke.  Take medicines only as directed by your health care provider.  If your health care provider recommended an exercise program for you, follow it as directed. Your exercise program may involve:  Walking three or more times a week.  Walking until you have certain symptoms of intermittent claudication.  Resting until symptoms go away.  Gradually increasing walking time to about 50 minutes a day. SEEK MEDICAL CARE IF: Your condition is not getting better or is getting worse. SEEK IMMEDIATE MEDICAL CARE IF:   You have chest pain.  You have difficulty breathing.  You develop arm weakness.  You have trouble speaking.  Your face begins to droop. MAKE SURE YOU:  Understand these instructions.  Will watch your condition.  Will get help if you are not doing well or get worse. This information is not intended to replace advice given to you by your health care provider. Make sure you discuss any questions you have with your health care provider. Document Released: 12/08/2003 Document Revised: 02/25/2014 Document Reviewed: 05/13/2013 Elsevier Interactive Patient Education  2017 [employer].

## 2016-03-15 NOTE — Telephone Encounter (Signed)
Meds ordered this encounter  Medications  . escitalopram (LEXAPRO) 20 MG tablet    Sig: TAKE 1 TABLET (20 MG TOTAL) BY MOUTH DAILY.    Dispense:  30 tablet    Refill:  1   Left message on patient's VM that Rx authorized and reminder to schedule follow-up with Dr. Carlota Raspberry for routine care in 03/2016.

## 2016-03-18 ENCOUNTER — Ambulatory Visit: Payer: Self-pay | Admitting: *Deleted

## 2016-03-18 VITALS — BP 132/70 | Wt 326.0 lb

## 2016-03-18 DIAGNOSIS — Z794 Long term (current) use of insulin: Secondary | ICD-10-CM

## 2016-03-18 DIAGNOSIS — I10 Essential (primary) hypertension: Secondary | ICD-10-CM

## 2016-03-18 DIAGNOSIS — E118 Type 2 diabetes mellitus with unspecified complications: Secondary | ICD-10-CM

## 2016-03-18 NOTE — Progress Notes (Signed)
Pt in for weekly BP and Wt check. Reports at carotid scan appt, his systolic BP had a 30 point difference between both arms. Requests both sides be checked today. <10% difference today.  Reports L arm intermittently with pain to mid upper arm extending down to mid forearm. No known causes, no injury, no precursor sx. Denies ShOB worse than baseline, chest pain, diaphoresis. Denies Hx of c-spine issues. Denies shoulder pain, localized to mid arm. Plans to speak to cardiologist at upcoming appt next week regarding this to discuss possibility of stress test to eval as it has been worsening over past few months.   Requesting refill of Omeprazole 16m daily. Will process that with NP tomorrow during clinic. Pt reports being taken off Lisinopril in early January 2/2 presumed ACE-I induced cough. However, he believes it could have been illness-related and plans to talk to MD about restarting this as he feels it was controlling BP better. Pt will update med list again with RN as needed.

## 2016-03-18 NOTE — Addendum Note (Signed)
Addended by: Lianne Cure A on: 03/18/2016 12:26 PM   Modules accepted: Orders

## 2016-03-21 ENCOUNTER — Encounter: Payer: Self-pay | Admitting: Neurology

## 2016-03-21 ENCOUNTER — Ambulatory Visit (INDEPENDENT_AMBULATORY_CARE_PROVIDER_SITE_OTHER): Payer: PRIVATE HEALTH INSURANCE | Admitting: Neurology

## 2016-03-21 VITALS — BP 156/77 | HR 66 | Resp 20 | Ht 73.0 in | Wt 328.0 lb

## 2016-03-21 DIAGNOSIS — E6609 Other obesity due to excess calories: Secondary | ICD-10-CM | POA: Diagnosis not present

## 2016-03-21 DIAGNOSIS — IMO0001 Reserved for inherently not codable concepts without codable children: Secondary | ICD-10-CM

## 2016-03-21 DIAGNOSIS — I2723 Pulmonary hypertension due to lung diseases and hypoxia: Secondary | ICD-10-CM

## 2016-03-21 DIAGNOSIS — I2510 Atherosclerotic heart disease of native coronary artery without angina pectoris: Secondary | ICD-10-CM

## 2016-03-21 DIAGNOSIS — J449 Chronic obstructive pulmonary disease, unspecified: Secondary | ICD-10-CM | POA: Diagnosis not present

## 2016-03-21 DIAGNOSIS — N182 Chronic kidney disease, stage 2 (mild): Secondary | ICD-10-CM

## 2016-03-21 DIAGNOSIS — G4733 Obstructive sleep apnea (adult) (pediatric): Secondary | ICD-10-CM

## 2016-03-21 DIAGNOSIS — R0602 Shortness of breath: Secondary | ICD-10-CM

## 2016-03-21 NOTE — Patient Instructions (Signed)
Pulmonary Hypertension Pulmonary hypertension is high blood pressure within the arteries in your lungs (pulmonary arteries). It is different than having high blood pressure elsewhere in your body, such as blood pressure that is measured with a blood pressure cuff. Pulmonary hypertension makes it harder for blood to flow through the lungs. As a result, the heart must work harder to pump blood through the lungs, and it may be harder for you to breathe. Over time, this can weaken the heart muscle. Pulmonary hypertension is a serious condition and it can be fatal. What are the causes? Many different medical conditions can cause pulmonary hypertension. Pulmonary hypertension can be categorized by cause into five groups: Group 1  Pulmonary hypertension that is caused by abnormal growth of small blood vessels in the lungs (pulmonary arterial hypertension). The abnormal blood vessel growth may have no known cause, or it may be:  Passed along from a parent (hereditary).  Caused by another disease, such as a connective tissue disease (including lupus or scleroderma) or HIV.  Caused by certain drugs or toxins. Group 2  Pulmonary hypertension that is caused by weakness of the main chamber of the heart (left ventricle) or heart valve disease. Group 3  Pulmonary hypertension that is caused by lung disease or low oxygen levels. Causes in this group include:  Emphysema or chronic obstructive pulmonary disease (COPD).  Untreated sleep apnea.  Pulmonary fibrosis. Group 4  Pulmonary hypertension that is caused by blood clots in the lungs (pulmonary emboli). Group 5  Other causes of pulmonary hypertension, such as sickle cell anemia, or a mix of multiple causes. What are the signs or symptoms? Symptoms of this condition include:  Shortness of breath. You may notice shortness of breath with:  Activity, such as walking.  No activity.  Tiredness and fatigue.  Dizziness or fainting.  Rapid heartbeat  or feeling your heart flutter or skip a beat (palpitations).  Neck vein enlargement.  Bluish color to your lips and fingertips. How is this diagnosed? This condition may be diagnosed by:  Chest X-ray.  Arterial blood gases. This test checks the acidity of your blood as well as your blood oxygen and carbon dioxide levels.  CT scan. This test can provide detailed images of your lungs.  Pulmonary function test. This test measures how much air your lungs can hold. It also tests how well air moves in and out of your lungs.  Electrocardiogram (ECG). This test traces the electrical activity of your heart.  Echocardiogram. This test is used to look at your heart in motion and check how it is functioning.  Heart catheterization. This test can measure the pressure in your pulmonary artery and the right side of your heart.  Lung biopsy. This procedure involves checking a sample of lung tissue to find underlying causes. How is this treated? There is no cure for pulmonary hypertension, but treatment can help to relieve symptoms and slow the progress of the condition. Treatment can involve:  Medicines, such as:  Blood pressure medicines.  Medicines to relax (dilate) the pulmonary blood vessels.  Water pills to get rid of extra fluid (diuretic medicines).  Blood-thinning medicines.  Surgery. For severe pulmonary hypertension that does not respond to medical treatment, heart-lung or lung transplant may be needed. Follow these instructions at home:  Take medicines only as directed by your health care provider. These include over-the-counter medicines and prescription medicines. Take all medicines exactly as instructed. Do not change or stop medicines without first checking with your health  care provider.  Do not smoke. If you need help quitting, ask your health care provider.  Eat a healthy diet.  Limit your salt (sodium) intake to less than 2,300 mg per day.  Stay as active as  possible. Exercise as directed by your health care provider. Talk with your health care provider about what type of exercise is safe for you.  Avoid high altitudes.  Avoid hot tubs and saunas.  Avoid becoming pregnant, if this applies. Talk with your health care provider about safe methods of birth control.  Keep all follow-up visits as directed by your health care provider. This is important. Get help right away if:  You have severe shortness of breath.  You develop chest pain or pressure in your chest.  You cough up blood.  You develop swelling of your feet or legs.  You have a significant increase in weight within 1-2 days. This information is not intended to replace advice given to you by your health care provider. Make sure you discuss any questions you have with your health care provider. Document Released: 12/02/2006 Document Revised: 08/25/2015 Document Reviewed: 07/27/2012 Elsevier Interactive Patient Education  2017 [employer].

## 2016-03-21 NOTE — Progress Notes (Signed)
SLEEP MEDICINE CLINIC   Provider:  Larey [name], M D  Referring Provider: [name], MD Primary Care Physician:  Harold Reichmann, MD  Chief Complaint  Patient presents with  . New Patient (Initial Visit)    cpap, uses [facility]    HPI:  Harold [name] is a 61 y.o. male , seen here as a referral  from Harold [name] , patient needs CPAP follow up after a 5.5 year hiatus.    Harold [name] underwent a sleep study on 10/01/2010 and at the time was diagnosed with severe sleep apnea at an AHI of 63.2 per hour there was no REM sleep noted at the patient slept all night in nonsupine position. It was likely that he would have had worse apnea would he have entered REM sleep or supine sleep. His desaturation index per hour was 67.4 and he spent 45.7 minutes of desaturation time in total. Oxygen nadir was 74%. CPAP was initiated at 6 cm water pressure and titrated to 11 cm the overall AHI was 3.1 the desaturation time was restricted to 7.4 minutes and the patient slept with immediate REM sleep rebound. In the meantime he had continued to use CPAP compliantly but his machine is not downloadable here in our office today. It seems that the memory chip is corrupted. Prior to CPAP use he fell asleep driving, at work he was working in a call center and didn't have much physical activity. He struggled to stay awake after each lunch. He fell asleep during a conversation with a customer on the phone. He had endorsed the Epworth score at 21 out of 24 points. Using CPAP has helped him to control his diabetes, has hypertension and he was very compliant. I saw him last on 03/20/2012 just after he had returned from back surgery he underwent fusion between the fourth and fifth lumbar spinal vertebrae with Harold. Cyndy [name].  He is here today to continue sleep care. He states that his CPAP is working fine for him and that he wouldn't sleep without it.  The patient has a history of coronary artery disease with percutaneous  coronary angioplasty in 2003 at 2014, chest pain was noted in March 2015 stents within the distal LAD and our see a. He has chronic renal insufficiency stage II, chronic venous insufficiency sometimes wears a TED hose for support diabetes mellitus with diabetes. Neuropathy, GERD with possible contribution by gastroparesis, he is in the obesity class II BMI 35-39 with comorbidities, as pulmonary hypertension with a PA pressure is 58 mmHg and has obstructive sleep apnea for which he uses CPAP. Just last week he underwent a carotid ultrasound and was told that his carotid arteries are patent. He is in the work up for COPD- SOB. 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     Sleep habits are as follows: His bedtime is 9.30 PM and he rises at 7 AM. He describes his bedroom is cool, quiet and dark. He sleeps on 2 pillows he sleeps on his side, he shares a bed with his spouse. He has been witnessed to snore on occasion if his CPAP mask was dislodged or if he is in supine sleep position. He has 1 maximally 2 bathroom breaks at night and some nights has none. He usually can go right back to sleep if his sleep is interrupted. He estimates getting about 9 hours of nocturnal sleep. He wakes up refreshed and restored. He does not  have morning headaches, he is not feeling tired in the morning on the contrary he stays alert throughout the day.   Social history: He is still working in a call center, he has meanwhile married, lives with his spouse, he drinks one cup of coffee in the morning him a diet soda during the day, does not consume tea or iced tea. He does not drink alcohol at all, he does not use tobacco products, quit smoking in 2005. Feb 1.   Review of Systems: Out of a complete 14 system review, the patient complains of only the following symptoms, and all other reviewed systems are negative. Obese, CPAP user. Chest pain, SOB.   Epworth score 9 , Fatigue severity  score 54, depression score 2/15   Social History   Social History  . Marital status: Single    Spouse name: N/A  . Number of children: 0  . Years of education: hs   Occupational History  . [employer]   .  [employer]   Social History Main Topics  . Smoking status: Former Smoker    Packs/day: 1.00    Years: 42.00    Types: Cigarettes    Quit date: 03/23/2003  . Smokeless tobacco: Never Used  . Alcohol use No  . Drug use: No  . Sexual activity: Not on file   Other Topics Concern  . Not on file   Social History Narrative   He and his partner Harold [name] have now officially gotten married on 05/26/2013.  Harold [name] is also a patient of our clinic.  He is now initially hyphenated his last name.   He [name] at [employer].   He does not smoke, he quit in 2009.  He does not drink alcohol.   He was adopted and no family history is known.     Family History  Problem Relation Age of Onset  . Adopted: Yes  . Family history unknown: Yes    Past Medical History:  Diagnosis Date  . Anginal pain [facility]) March 2015   Cardiac cath showed patent stents with distal LAD, circumflex-OM and RCA disease in small vessels.  . Anxiety   . CAD S/P percutaneous coronary angioplasty 2003, 04/2012   status post PCI to LAD, circumflex-OM 2, RCA  . Carotid artery occlusion   . Chronic renal insufficiency, stage II (mild)   . Chronic venous insufficiency    varicosities, no reflux; dopplers 04/14/12- valvular insufficiency in the R and L GSV  . Complication of anesthesia   . Depression    situaltional   . Diabetes mellitus   . Dyslipidemia associated with type 2 diabetes mellitus (Harold [name])   . GERD (gastroesophageal reflux disease)   . HTN (hypertension)   . Hypothyroidism   . Neuromuscular disorder (Harold [name])    neuropathy in feet  . Neuropathy (Harold [name])    notably improved following PCI with improved cardiac function  . Obesity   . Obesity, Class II, BMI 35.0-39.9, with comorbidity  (see actual BMI)    BMI 39; wgt loss efforts in place; seeing Dietician  . PONV (postoperative nausea and vomiting)    "Harold [name]"  . Pulmonary hypertension 04/2012   PA pressure 72mhg  . Sleep apnea    uses nightly    Past Surgical History:  Procedure Laterality Date  . ABDOMINAL AORTAGRAM N/A 11/15/2011   Procedure: ABDOMINAL AMaxcine Ham  Surgeon: CElam Dutch MD;  [facility];  Service: Cardiovascular;  [name];  . ABIs  04/27/2012  mild bilateral arterial insufficiency  . BACK SURGERY  2005 x1   X2-2010  . BUNIONECTOMY Right 11/11/2013   Procedure: RIGHT FOOT SILVER BUNIONECTOMY;  Surgeon: Wylene Simmer, MD;  Location: [facility];  Service: Orthopedics;  Laterality: Right;  . CARDIAC CATHETERIZATION  12/11/2001   significant 3V CAD, normal LV function  . CARDIAC CATHETERIZATION  March 2015   Moderate Pulm HTN: 46/16 - mean 33 mmHg; PCWP 51mHg;; [name], widely patent proximal and mid cervical extends with previous subtotal occlusion of smaller tube, I be patent RCA ostial mid and distal stents with 70% distal continuation RCA disease  . Carotid Doppler  04/27/2012   right internal carotid: Elevated velocities but no evidence of plaque. Left internal carotid 40-59%  . CAROTID ENDARTERECTOMY  2005   Right; recent carotid Dopplers notes elevated velocities.   . colonscopy    . CORONARY ANGIOPLASTY  12/21/2001   PTCA of the distal and mid AV groove circ, unsuccessful PTCA of second OM total occlusion, unsuccessful PTCA of the apical LAD total occlusion  . CORONARY ANGIOPLASTY WITH STENT PLACEMENT  04/29/12   PCI to 3 RCA lesions, Promus Premiere 2.28m8mm distally, mid was 2.61m68mmm and proximally 2.75x16m26mF 55-60%  . CORONARY STENT PLACEMENT  04/28/12   PCI to LAD (3x23mm67mnce DES postdilated to 3.25) and circ prox and mid (2 overlappinmg 2.61mmx172mXience DES postdilated to 2.761mm) 57m DOPPLER ECHOCARDIOGRAPHY  04/28/2012   poor quality study: EF estimated 60-65%; unable to assess diastolic function (previously noted to have diastolic dysfunction); severely dilated left atrium and mild right atrium; dilated IVC consistent with increased central venous pressure..  . HERNIA REPAIR    . LEFT AND RIGHT HEART CATHETERIZATION WITH CORONARY ANGIOGRAM N/A 04/27/2012   Procedure: LEFT AND RIGHT HEART CATHETERIZATION WITH CORONARY ANGIOGRAM;  Surgeon: David WLeonie ManLocation[facility];  Service: Cardiovascular;  [name];  . LEFT AND RIGHT HEART CATHETERIZATION WITH CORONARY ANGIOGRAM N/A 05/14/2013   Procedure: LEFT AND RIGHT HEART CATHETERIZATION WITH CORONARY ANGIOGRAM;  Surgeon: Thomas Troy SineLocation[facility];  Service: Cardiovascular;  [name];  . LUMBAR LAMINECTOMY/DECOMPRESSION MICRODISCECTOMY  01/07/2012   Procedure: LUMBAR LAMINECTOMY/DECOMPRESSION MICRODISCECTOMY 1 LEVEL;  Surgeon: Kyle L Winfield CunasLocation: [facility]UR[facility];  Service: Neurosurgery;  Laterality: Bilateral;   Lumbar Three-Four Decompression  . PERCUTANEOUS CORONARY STENT INTERVENTION (PCI-S) N/A 04/28/2012   Procedure: PERCUTANEOUS CORONARY STENT INTERVENTION (PCI-S);  Surgeon: Thomas Troy SineLocation[facility];  Service: Cardiovascular;  [name];  . PERCUTANEOUS CORONARY STENT INTERVENTION (PCI-S) N/A 04/29/2012   Procedure: PERCUTANEOUS CORONARY STENT INTERVENTION (PCI-S);  Surgeon: David WLeonie ManLocation[facility];  Service: Cardiovascular;  [name];  . SPINE SURGERY    . UMBILICAL HERNIA REPAIR  2009   steel mesh insert    Current Outpatient Prescriptions  Medication Sig Dispense Refill  . BRILINTA 90 MG TABS tablet TAKE 1 TABLET BY MOUTH 2 TIMES DAILY. KEEP OFFFICE VISIT. 60 tablet 4  . carvedilol (COREG) 6.25 MG tablet TAKE 1 TABLET (6.25 MG TOTAL) BY MOUTH 2 (TWO) TIMES DAILY. 60 tablet 9  . escitalopram (LEXAPRO) 20 MG tablet TAKE  1 TABLET (20 MG TOTAL) BY MOUTH DAILY. 30 tablet 1  . esomeprazole (NEXIUM) 20 MG capsule Take 1 capsule (20 mg total) by mouth daily at 12 noon. 90 capsule 3  .  felodipine (PLENDIL) 10 MG 24 hr tablet TAKE 1 TABLET BY MOUTH DAILY. 30 tablet 11  . furosemide (LASIX) 80 MG tablet Take 80 mg by mouth daily.  6  . insulin aspart (NOVOLOG) 100 UNIT/ML injection Inject 34 Units into the skin 3 (three) times daily with meals. IN ADDITION 25 MG ONCE ADAY    . Insulin Detemir (LEVEMIR) 100 UNIT/ML Pen Inject 54 Units into the skin 2 (two) times daily. 75 units twice a day     . levothyroxine (SYNTHROID, LEVOTHROID) 88 MCG tablet Take 88 mcg by mouth daily before breakfast.    . NITROSTAT 0.4 MG SL tablet PLACE 1 TAB UNDER THE TONGUE EVERY 5 MINUTES AS NEEDED FOR CHEST PAIN (SEVERE PRESSURE OR TIGHTNESS) 25 tablet 3  . TRULICITY 1.5 XN/2.3FT SOPN Inject 1.5 mg into the skin once a week.  5   No current facility-administered medications for this visit.     Allergies as of 03/21/2016 - Review Complete 03/21/2016  Allergen Reaction Noted  . Septra [bactrim] Itching 04/20/2011  . Penicillins Hives 04/20/2011  . Clopidogrel  10/04/2013  . Metformin and related  03/07/2015  . Other  01/03/2012    Vitals: BP (!) 156/77   Pulse 66   Resp 20   Ht _0  (1.854 m)   Wt (!) 328 lb (148.8 kg)   BMI 43.27 kg/m  Last Weight:  Wt Readings from Last 1 Encounters:  03/21/16 (!) 328 lb (148.8 kg)   DDU:KGUR mass index is 43.27 kg/m.     Last Height:   Ht Readings from Last 1 Encounters:  03/21/16 _1  (1.854 m)    Physical exam:  General: The patient is awake, alert and appears not in acute distress. The patient is well groomed. Head: Normocephalic, atraumatic. Neck is supple. Mallampati 5,  neck circumference:21.  Nasal airflow patent , TMJ is not evident . Retrognathia is not seen.  Cardiovascular:  Regular rate and rhythm , without  murmurs or carotid bruit, and without distended neck  veins. Respiratory: Lungs are clear to auscultation. He is tachypnoeic  Skin:  Without evidence of edema, or rash Trunk: BMI is elevated , obesity grade 3 , 43 BMI. The patient's posture is erect     Neurologic exam : The patient is awake and alert, oriented to place and time.    Attention span & concentration ability appears normal.  Speech is fluent, interrupted by shortness of breath. Mood and affect are appropriate.  Cranial nerves: Pupils are equal and briskly reactive to light.  Funduscopic exam without evidence of pallor or edema.  Extraocular movements in vertical and horizontal planes intact and without nystagmus.  Visual [name] by finger perimetry are intact. Hearing to finger rub intact.   Facial sensation intact to fine touch.  Facial motor strength is symmetric and tongue and uvula move midline. Shoulder shrug was symmetrical.   Motor exam:   Normal tone, muscle bulk and symmetric strength in all extremities.  Sensory:  Fine touch, pinprick and vibration were tested in all extremities.  Proprioception was normal.  Coordination: Rapid alternating movements and Finger-to-nose maneuver intact without evidence of ataxia, dysmetria or tremor.  Gait and station: Patient walks without assistive device and is able unassisted to climb up to the exam table. Strength within normal limits.  Stance is stable and normal.   Deep tendon reflexes: in the upper and lower extremities are symmetric and intact. Babinski maneuver response is downgoing.  The patient was advised of the  nature of the diagnosed sleep disorder , the treatment options and risks for general a health and wellness arising from not treating the condition.  I spent more than 45  minutes of face to face time with the patient. Greater than 50% of time was spent in counseling and coordination of care. We have discussed the diagnosis and differential and I answered the patient's questions.     Assessment:  After physical  and neurologic examination, review of laboratory studies,  Personal review of imaging studies, reports of other /same  Imaging studies ,  Results of polysomnography/ neurophysiology testing and pre-existing records as far as provided in visit., my assessment is   1) Harold [name] presents today with tachypnea and he is slightly short of breath even at rest being also by now morbidly obese, he fulfills the category 3 of obesity with other multiple moc rbidities. Suspected COPD, history of coronary artery disease, status post carotid endarterectomy right side, angioplasty with stent placement, recurrent angina pectoris. Dyslipidemia, diabetes mellitus2, gastroesophageal reflux disease, hypertension, hypothyroidism neuropathy related to diabetes leg pain also related to peripheral vascular disease, the patient's most recent Doppler studies show normal venous return. He was diagnosed with obstructive sleep apnea and would continue using CPAP compliantly but suffers from increasing daytime fatigue while endorsing an average level of daytime sleepiness with 9 points on the Epworth score. Unfortunately I don't Get data from his machine here today, which would help me to determine if he needs a new sleep study to qualify for another CPAP machine, or if his apnea index is very well controlled and current settings. His machine is a Res[employer] S * , he will need another data chip from [facility].   2) [employer] patient- given the arising comorbidities many of which have evolved since he was last seen including a higher body mass index is undoubtedly the case that the patient still has apnea but I do not know to which degree at baseline. If he truly has COPD it will be also effective for him to continue using CPAP to control pulmonary hypertension and hypoxemia at night. For this reason I will ask him to return for an attended sleep study with hypercapnia measures.  3) He is due for a new machine should his old one be broken.      Plan:  Treatment plan and additional workup :  Download through [facility].  SPLIT night polysomnography in lab.   Co-morbiditis would not allow for auto-titration and HST.    Asencion Partridge Daiwik Buffalo MD  03/21/2016   CC: Harold [name], [address] [place], [place] [address]

## 2016-03-26 ENCOUNTER — Institutional Professional Consult (permissible substitution): Payer: Self-pay | Admitting: Pulmonary Disease

## 2016-03-27 ENCOUNTER — Telehealth: Payer: Self-pay | Admitting: *Deleted

## 2016-03-27 NOTE — Telephone Encounter (Signed)
ROUTE --defer to Dr Ellyn Hack    Request for surgical clearance:  1. What type of surgery is being performed? COLONOSCOPY AND EGD   2. When is this surgery scheduled? 05/02/2016  3. Are there any medications that need to be held prior to surgery and how long? BRILINTA   4. Name of physician performing surgery? DR PATRICK HUNG  5. What is your office phone and fax number? ATTNCaryl Pina FLYNT  PHONE  [phone]   FAX [phone]

## 2016-03-28 NOTE — Telephone Encounter (Signed)
Okay to hold Brilinta for colonoscopy / EGD   Glenetta Hew, MD

## 2016-03-29 ENCOUNTER — Ambulatory Visit (INDEPENDENT_AMBULATORY_CARE_PROVIDER_SITE_OTHER): Payer: No Typology Code available for payment source | Admitting: Cardiology

## 2016-03-29 ENCOUNTER — Encounter: Payer: Self-pay | Admitting: Cardiology

## 2016-03-29 VITALS — BP 149/72 | HR 61 | Ht 74.0 in | Wt 328.8 lb

## 2016-03-29 DIAGNOSIS — I2510 Atherosclerotic heart disease of native coronary artery without angina pectoris: Secondary | ICD-10-CM | POA: Diagnosis not present

## 2016-03-29 DIAGNOSIS — I272 Pulmonary hypertension, unspecified: Secondary | ICD-10-CM

## 2016-03-29 DIAGNOSIS — I5032 Chronic diastolic (congestive) heart failure: Secondary | ICD-10-CM

## 2016-03-29 DIAGNOSIS — E1169 Type 2 diabetes mellitus with other specified complication: Secondary | ICD-10-CM

## 2016-03-29 DIAGNOSIS — I10 Essential (primary) hypertension: Secondary | ICD-10-CM

## 2016-03-29 DIAGNOSIS — I878 Other specified disorders of veins: Secondary | ICD-10-CM

## 2016-03-29 DIAGNOSIS — Z9861 Coronary angioplasty status: Secondary | ICD-10-CM

## 2016-03-29 DIAGNOSIS — E782 Mixed hyperlipidemia: Secondary | ICD-10-CM

## 2016-03-29 NOTE — Telephone Encounter (Signed)
ROUTED INFORMATION TO DR HUNG'S OFFICE

## 2016-03-29 NOTE — Patient Instructions (Addendum)
NO CHANGE WITH CURRENT  TREATMENT.   HAVE BLOOD PRESSURE CHECKED IN BOTH ARMS - BY OCCUPATIONAL NURSE.   Your physician wants you to follow-up in 4-6 MONTHS WITH DR HARDING. You will receive a reminder letter in the mail two months in advance. If you don't receive a letter, please call our office to schedule the follow-up appointment.   If you need a refill on your cardiac medications before your next appointment, please call your pharmacy.

## 2016-03-29 NOTE — Progress Notes (Signed)
PCP: Horatio Pel, MD  Nephrology, Dr. Jimmy Footman, Pulmonology: Dr. Melvyn Novas  Clinic Note: Chief Complaint  Patient presents with  . Follow-up    6 months; CAD - MV PCI  . Shortness of Breath    due to COPD    HPI: Harold [name] is a 61 y.o. male with a PMH below who presents today for delayed six-month follow-up for his CAD - status post multivessel PCI. He also has difficult to manage dyslipidemia  [name] was last seen in May 2017 episode of chest discomfort following moving boxes. Chest pain was thought to be musculoskeletal in nature.  Recent Hospitalizations: none  Studies Reviewed:   Carotid Dopplers: No significant stenosis in Bilat ICA.  Patent R CEA site with <39% stenosis in R PICA. 40-59% L ICA stenosis.  Interval History: Harold [name] presents today doing well from a cardiac standpoint with no active CAD-angina symptoms. He does note dyspnea - worse with exertion & has been formally diagnosed with COPD (he is a bit upset by this, but is glad to have a reason for his Sx).  He denies any resting dyspnea.   No anginal chest pain or pressure with rest or exertion.  No PND, orthopnea & his chronic edema is all but resolved along with all of the ulcers & much of the discoloration. Occasional short-lived palpitations - but not associated with lightheadedness, dizziness, weakness or syncope/near syncope. No TIA/amaurosis fugax symptoms. No melena, hematochezia, hematuria, or epstaxis. No claudication.  ROS: A comprehensive was performed. Review of Systems  HENT: Negative for congestion and hearing loss.   Respiratory: Positive for cough (mild, occasional) and shortness of breath (no real change). Negative for wheezing (not really).   Cardiovascular:       Per HPI  Gastrointestinal: Negative for blood in stool, heartburn and melena.  Genitourinary: Negative for hematuria.  Musculoskeletal: Positive for joint pain (both knees).  Neurological: Negative  for dizziness.       Still has Bilat LE DM-2 related PN  Psychiatric/Behavioral: Negative for depression and memory loss. The patient is not nervous/anxious and does not have insomnia.   All other systems reviewed and are negative.   Past Medical History:  Diagnosis Date  . Anginal pain [facility]) March 2015   Cardiac cath showed patent stents with distal LAD, circumflex-OM and RCA disease in small vessels.  . Anxiety   . CAD S/P percutaneous coronary angioplasty 2003, 04/2012   status post PCI to LAD, circumflex-OM 2, RCA  . Carotid artery occlusion   . Chronic renal insufficiency, stage II (mild)   . Chronic venous insufficiency    varicosities, no reflux; dopplers 04/14/12- valvular insufficiency in the R and L GSV  . Complication of anesthesia   . COPD, mild ([place])   . Depression    situaltional   . Diabetes mellitus   . Dyslipidemia associated with type 2 diabetes mellitus ([place])   . GERD (gastroesophageal reflux disease)   . HTN (hypertension)   . Hypothyroidism   . Neuromuscular disorder (HCC)    neuropathy in feet  . Neuropathy ([place])    notably improved following PCI with improved cardiac function  . Obesity   . 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 PONV (postoperative nausea and vomiting)    "[place]"  . Pulmonary hypertension 04/2012   PA pressure 13mhg  . Sleep apnea    uses  nightly    Past Surgical History:  Procedure Laterality Date  . ABDOMINAL AORTAGRAM N/A 11/15/2011   Procedure: ABDOMINAL Maxcine Ham;  Surgeon: Elam Dutch, MD;  Location: [facility] CATH LAB;  Service: Cardiovascular;  [name];  . ABIs  04/27/2012   mild bilateral arterial insufficiency  . BACK SURGERY  2005 x1   X2-2010  . BUNIONECTOMY Right 11/11/2013   Procedure: RIGHT FOOT SILVER BUNIONECTOMY;  Surgeon: Wylene Simmer, MD;  Location: [place];  Service: Orthopedics;  Laterality:  Right;  . CARDIAC CATHETERIZATION  12/11/2001   significant 3V CAD, normal LV function  . CARDIAC CATHETERIZATION  March 2015   Moderate Pulm HTN: 46/16 - mean 33 mmHg; PCWP 89mHg;; [name], widely patent proximal and mid cervical extends with previous subtotal occlusion of smaller tube, I be patent RCA ostial mid and distal stents with 70% distal continuation RCA disease  . Carotid Doppler  04/27/2012   right internal carotid: Elevated velocities but no evidence of plaque. Left internal carotid 40-59%  . CAROTID ENDARTERECTOMY  2005   Right; recent carotid Dopplers notes elevated velocities.   . colonscopy    . CORONARY ANGIOPLASTY  12/21/2001   PTCA of the distal and mid AV groove circ, unsuccessful PTCA of second OM total occlusion, unsuccessful PTCA of the apical LAD total occlusion  . CORONARY ANGIOPLASTY WITH STENT PLACEMENT  04/29/12   PCI to 3 RCA lesions, Promus Premiere 2.271m8mm distally, mid was 2.3m18mmm and proximally 2.75x16m43mF 55-60%  . CORONARY STENT PLACEMENT  04/28/12   PCI to LAD (3x23mm39mnce DES postdilated to 3.25) and circ prox and mid (2 overlappinmg 2.3mmx143mXience DES postdilated to 2.73mm) 71mOPPLER ECHOCARDIOGRAPHY  04/28/2012   poor quality study: EF estimated 60-65%; unable to assess diastolic function (previously noted to have diastolic dysfunction); severely dilated left atrium and mild right atrium; dilated IVC consistent with increased central venous pressure..  . HERNIA REPAIR    . LEFT AND RIGHT HEART CATHETERIZATION WITH CORONARY ANGIOGRAM N/A 04/27/2012   Procedure: LEFT AND RIGHT HEART CATHETERIZATION WITH CORONARY ANGIOGRAM;  Surgeon: [place] WLeonie ManLocation[facility];  Service: Cardiovascular;  [name];  . LEFT AND RIGHT HEART CATHETERIZATION WITH CORONARY ANGIOGRAM N/A 05/14/2013   Procedure: LEFT AND RIGHT HEART CATHETERIZATION WITH CORONARY ANGIOGRAM;  Surgeon: Thomas Troy SineLocation: [facility];  Service: Cardiovascular;  [name];  . LUMBAR LAMINECTOMY/DECOMPRESSION MICRODISCECTOMY  01/07/2012   Procedure: LUMBAR LAMINECTOMY/DECOMPRESSION MICRODISCECTOMY 1 LEVEL;  Surgeon: Kyle L Winfield CunasLocation: [facility] [place];  Service: Neurosurgery;  Laterality: Bilateral;   Lumbar Three-Four Decompression  . PERCUTANEOUS CORONARY STENT INTERVENTION (PCI-S) N/A 04/28/2012   Procedure: PERCUTANEOUS CORONARY STENT INTERVENTION (PCI-S);  Surgeon: Thomas Troy SineLocation[facility];  Service: Cardiovascular;  [name];  . PERCUTANEOUS CORONARY STENT INTERVENTION (PCI-S) N/A 04/29/2012   Procedure: PERCUTANEOUS CORONARY STENT INTERVENTION (PCI-S);  Surgeon: Izaya Netherton WLeonie ManLocation: [facility];  Service: Cardiovascular;  [name];  . SPINE SURGERY    . UMBILICAL HERNIA REPAIR  2009   steel mesh insert    Current Meds  Medication Sig  . BRILINTA 90 MG TABS tablet TAKE 1 TABLET BY MOUTH 2 TIMES DAILY. KEEP OFFFICE VISIT.  . carveMarland Kitchenilol (COREG) 6.25 MG tablet TAKE 1 TABLET (6.25 MG TOTAL) BY MOUTH 2 (TWO) TIMES DAILY.  . escitMarland Kitchenlopram (LEXAPRO) 20 MG tablet  TAKE 1 TABLET (20 MG TOTAL) BY MOUTH DAILY.  Marland Kitchen esomeprazole (NEXIUM) 20 MG capsule Take 1 capsule (20 mg total) by mouth daily at 12 noon.  . furosemide (LASIX) 80 MG tablet Take 80 mg by mouth daily.  . insulin aspart (NOVOLOG) 100 UNIT/ML injection Inject 34 Units into the skin 3 (three) times daily with meals. IN ADDITION 25 MG ONCE ADAY  . Insulin Detemir (LEVEMIR) 100 UNIT/ML Pen Inject 54 Units into the skin 2 (two) times daily. 75 units twice a day   . levothyroxine (SYNTHROID, LEVOTHROID) 88 MCG tablet Take 88 mcg by mouth daily before breakfast.  . NITROSTAT 0.4 MG SL tablet PLACE 1 TAB UNDER THE TONGUE EVERY 5 MINUTES AS NEEDED FOR CHEST PAIN (SEVERE PRESSURE OR TIGHTNESS)  . telmisartan (MICARDIS) 80 MG tablet Take 80 mg by mouth daily.  . TRULICITY 1.5 MW/4.1LK SOPN  Inject 1.5 mg into the skin once a week.    Allergies  Allergen Reactions  . Septra [Bactrim] Itching  . Penicillins Hives    Allergy to All cillin drugs  Ancef given 9/24 with no obvious reaction  . Clopidogrel     NON RESPONDER  P2Y12  . Metformin And Related   . Other     Walnuts, pecans     Social History   Social History  . Marital status: Single    Spouse name: N/A  . Number of children: 0  . Years of education: hs   Occupational History  . [employer]   .  [employer]   Social History Main Topics  . Smoking status: Former Smoker    Packs/day: 1.00    Years: 42.00    Types: Cigarettes    Quit date: 03/23/2003  . Smokeless tobacco: Never Used  . Alcohol use No  . Drug use: No  . Sexual activity: Not Asked   Other Topics Concern  . None   Social History Narrative   He and his partner [name] have now officially gotten married on 05/26/2013.  [name] is also a patient of our clinic.  He is now initially hyphenated his last name.   He works at [employer].   He does not smoke, he quit in 2009.  He does not drink alcohol.   He was adopted and no family history is known.     He was adopted. Family history is unknown by patient.  Wt Readings from Last 3 Encounters:  03/29/16 (!) 149.1 kg (328 lb 12.8 oz)  03/21/16 (!) 148.8 kg (328 lb)  03/18/16 (!) 147.9 kg (326 lb)    PHYSICAL EXAM BP (!) 149/72   Pulse 61   Ht _0  (1.88 m)   Wt (!) 149.1 kg (328 lb 12.8 oz)   BMI 42.22 kg/m  General appearance: alert, cooperative, appears stated age, no distress and morbidly obese; Normal Mood & Affect Neck: no adenopathy, no carotid bruit and no JVD Lungs: clear to auscultation bilaterally, normal percussion bilaterally and non-labored Heart: regular rate and rhythm, distant but normal S1& S2, no murmur, click, rub or gallop; unable to palpate PMI Abdomen: soft, non-tender; bowel sounds normal; no masses, no organomegaly; Cannot palpate  for HSM due to obesity Extremities: extremities normal, atraumatic, no cyanosis, and edema~ 2+ to knees with venous stasis changes - but notably improved from his prior baseline Pulses: 2+ and symmetric B UE - cannot palpate LE pulses due to edema Skin: mobility and turgor normal; BLE venous stasis changes - notably less  significant. Edema notably improved. Skin has normal texture with no lesions. Neurologic: Mental status: Alert, oriented, thought content appropriate    Adult ECG Report N/A   Other studies Reviewed: Additional studies/ records that were reviewed today include:  Recent Labs:   Lab Results  Component Value Date   CHOL 291 (H) 08/31/2015   HDL 46 08/31/2015   LDLCALC 177 (H) 08/31/2015   TRIG 340 (H) 08/31/2015   CHOLHDL 6.3 (H) 08/31/2015   Lab Results  Component Value Date   CREATININE 1.51 (H) 10/16/2015   BUN 36 (H) 10/16/2015   NA 134 (L) 10/16/2015   K 4.9 10/16/2015   CL 96 (L) 10/16/2015   CO2 26 10/16/2015    ASSESSMENT / PLAN: Problem List Items Addressed This Visit    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 No further echo since that time. Continues to be stable on carvedilol, Micardis (after having switched from lisinopril cough).  On Brilinta but no aspirin. No longer on statin due to significant myalgias and myositis      Relevant Medications   telmisartan (MICARDIS) 80 MG tablet   Obesity, Class III, BMI 40-49.9 (morbid obesity) ([place]) (Chronic)    Unfortunately, this is been one area where he has been having a hard time being successful to lose weight. We talked about dietary adjustments and increasing his exercise. He insinuates that he intends  to get back into walking episode but this is been an on-going discussion.      Chronic diastolic heart failure, NYHA class 2 - PCWPf 22 mmHg during catheterization. (Chronic)    He does have baseline dyspnea which is probably now in part related to COPD diagnosis as well as obesity hypoventilation syndrome. Does not really describe PND or orthopnea and edema is much improved with current dose of Lasix is now being managed by Dr. Jimmy Footman from nephrology.      Relevant Medications   telmisartan (MICARDIS) 80 MG tablet   Essential hypertension (Chronic)    Not is well-controlled today as it has been. There have been some changes to his medication regimen now having switched from ACE inhibitor to the ARB Micardis. He may need to be on higher dose of carvedilol, but his heart rate is borderline right now. With his history of edema, we will opt to use calcium channel blocker.      Relevant Medications   telmisartan (MICARDIS) 80 MG tablet   Venous stasis of both lower extremities - with edema (Chronic)    Very much improved now. He does still wear the support stockings but not as much. He is on stable dose of Lasix that is monitored by his nephrologist.      Relevant Medications   telmisartan (MICARDIS) 80 MG tablet   Pulmonary hypertension, 58mhg (Chronic)    Probably related to obesity hypoventilation syndrome mild diastolic dysfunction and some COPD related primary pulmonary hypertension. Management will be with diuretic and blood pressure control.      Relevant Medications   telmisartan (MICARDIS) 80 MG tablet      Current medicines are reviewed at length with the patient today. (+/- concerns) None The following changes have been made: None  Patient  Instructions  NO CHANGE WITH CURRENT  TREATMENT.   HAVE BLOOD PRESSURE CHECKED IN BOTH ARMS - BY OCCUPATIONAL NURSE.   Your physician wants you to follow-up in 4-6 MONTHS WITH DR Kristie Bracewell. You will receive a reminder letter in  the mail two months in advance. If you don't receive a letter, please call our office to schedule the follow-up appointment.   If you need a refill on your cardiac medications before your next appointment, please call your pharmacy.    Studies Ordered:   No orders of the defined types were placed in this encounter.     Glenetta Hew, M.D., M.S. Interventional Cardiologist   Pager # [phone] Phone # [phone] [address] [place], [place] [address]

## 2016-03-31 ENCOUNTER — Encounter: Payer: Self-pay | Admitting: Cardiology

## 2016-03-31 NOTE — Assessment & Plan Note (Signed)
Probably related to obesity hypoventilation syndrome mild diastolic dysfunction and some COPD related primary pulmonary hypertension. Management will be with diuretic and blood pressure control.

## 2016-03-31 NOTE — Assessment & Plan Note (Signed)
Not is well-controlled today as it has been. There have been some changes to his medication regimen now having switched from ACE inhibitor to the ARB Micardis. He may need to be on higher dose of carvedilol, but his heart rate is borderline right now. With his history of edema, we will opt to use calcium channel blocker.

## 2016-03-31 NOTE — Assessment & Plan Note (Signed)
Very much improved now. He does still wear the support stockings but not as much. He is on stable dose of Lasix that is monitored by his nephrologist.

## 2016-03-31 NOTE — Assessment & Plan Note (Signed)
He does have baseline dyspnea which is probably now in part related to COPD diagnosis as well as obesity hypoventilation syndrome. Does not really describe PND or orthopnea and edema is much improved with current dose of Lasix is now being managed by Dr. Jimmy Footman from nephrology.

## 2016-03-31 NOTE — Assessment & Plan Note (Signed)
Unfortunately, this is been one area where he has been having a hard time being successful to lose weight. We talked about dietary adjustments and increasing his exercise. He insinuates that he intends to get back into walking episode but this is been an on-going discussion.

## 2016-03-31 NOTE — Assessment & Plan Note (Addendum)
Multivessel PCI in lieu of CABG almost  4 years ago. No further echo since that time. Continues to be stable on carvedilol, Micardis (after having switched from lisinopril cough).  On Brilinta but no aspirin. No longer on statin due to significant myalgias and myositis

## 2016-04-01 ENCOUNTER — Ambulatory Visit: Payer: Self-pay | Admitting: *Deleted

## 2016-04-02 ENCOUNTER — Ambulatory Visit: Payer: Self-pay | Admitting: *Deleted

## 2016-04-02 VITALS — BP 138/68 | Wt 331.0 lb

## 2016-04-02 DIAGNOSIS — I10 Essential (primary) hypertension: Secondary | ICD-10-CM

## 2016-04-02 DIAGNOSIS — E118 Type 2 diabetes mellitus with unspecified complications: Secondary | ICD-10-CM

## 2016-04-02 DIAGNOSIS — Z794 Long term (current) use of insulin: Secondary | ICD-10-CM

## 2016-04-02 NOTE — Progress Notes (Signed)
Pt in for weekly BP and weight check. Cardiology requests bilateral BPs weekly. Done. Reports cataracts Dx bilaterally. No surgery unless he can reduce his A1c in the next 90 days. Currently 10.3 per pt. Sees Dr. Hassell Done Thursday to have temp blood glucose monitor removed and evaluated. His long acting insulin dose was increased last week. He reports that he knows his problem is that he eats too much and eats junk. This has been a common topic of discussion in our meetings for quite a while. He finds difficulty sticking with a diet. Beginning to gain weight back from last 1-2 months losses. Additional diet modifications discussed again. Changing schedules to more routine schedule next week that he is hoping will help him eat and take his medications on a better schedule. No further questions/concerns at this time.

## 2016-04-04 ENCOUNTER — Telehealth: Payer: Self-pay | Admitting: Cardiology

## 2016-04-04 NOTE — Telephone Encounter (Signed)
My thought was considering PCSK9 inhibitor.

## 2016-04-04 NOTE — Telephone Encounter (Signed)
Patient is calling for two reasons:  1. Patient states that he does not qualify for the triglyceride study with Dr. Glennon Hamilton 2. Patient states that Dr. Ellyn Hack discussed putting him on a cholesterol medication and patient would like to get that started. Prescription can be sent to  [id number] [place], [place] Thanks.

## 2016-04-04 NOTE — Telephone Encounter (Signed)
Spoke with pt, aware there is nothing outlined in dr harding's last note about what cholesterol medication he would favor. The patient does not remember taking any cholesterol medications in the past. Aware will forward to dr harding for his review and advise. Pt agreed with this plan.

## 2016-04-05 NOTE — Telephone Encounter (Signed)
Left detail message on secure voicemail line-  Appointment is need with [facility] - pharmacist to discuss starting PCSK9. Scheduler will call to make an appointment.

## 2016-04-05 NOTE — Telephone Encounter (Signed)
For our information - will cancel schedule appointment with [facility]

## 2016-04-05 NOTE — Telephone Encounter (Signed)
New message    Pt is calling to let Dr. Ellyn Hack nurse know that he has started cholesterol medicine.

## 2016-04-08 ENCOUNTER — Ambulatory Visit: Payer: Self-pay | Admitting: *Deleted

## 2016-04-08 VITALS — BP 146/70 | HR 70 | Temp 97.4°F | Wt 322.0 lb

## 2016-04-08 DIAGNOSIS — Z794 Long term (current) use of insulin: Secondary | ICD-10-CM

## 2016-04-08 DIAGNOSIS — I10 Essential (primary) hypertension: Secondary | ICD-10-CM

## 2016-04-08 DIAGNOSIS — E118 Type 2 diabetes mellitus with unspecified complications: Secondary | ICD-10-CM

## 2016-04-08 NOTE — Progress Notes (Signed)
Pt in for weekly BP and Wt check. 9# wt loss in 1 week. He believes most likely fluid loss 2/2 starting Invokanna and noticing an increase in urine output in combination with Lasix that he has been taking chronically. No change in ShOB 2/2 COPD. Has not noticed LE swelling. Had other recent med changes. Increase to Levemir 60units BID, restart Fenofibric acid. Switching from Levemir to Lantus in 2 weeks. CBGs at home improving. Trending to 180-230 this past week with new med.  Today with feeling "funny, just out of sorts, really tired." Started just prior to arrival to clinic. Checked cbg at desk, 211. BP WNL. Afebrile. Cool to the touch. Appears slightly paler than normal. All other vitals WNL. Not increasing fluid intake despite increase in urine output. Encouraged increase fluid today. By the time he was leaving clinic, was feeling better and color returning to his baseline. Will f/u with RN if sx return.

## 2016-04-15 ENCOUNTER — Ambulatory Visit: Payer: Self-pay | Admitting: *Deleted

## 2016-04-15 VITALS — BP 128/70

## 2016-04-15 DIAGNOSIS — E118 Type 2 diabetes mellitus with unspecified complications: Secondary | ICD-10-CM

## 2016-04-15 DIAGNOSIS — Z794 Long term (current) use of insulin: Secondary | ICD-10-CM

## 2016-04-15 DIAGNOSIS — I10 Essential (primary) hypertension: Secondary | ICD-10-CM

## 2016-04-15 NOTE — Progress Notes (Signed)
Weekly BP and Wt checks. 7# wt gain in 1 week. Reports noticeable increased ShOB with chest feeling "heavier" over past few days that he believes is a combination of COPD sx and excess fluid. Denies LE edema. Denies chest pain, diaphoresis. Suggested he speak with his providers about a prn diuretic for excess fluid sx. Sts he will do this when he sees pulmonology on Thursday.  Reports blood sugars between 150 and 200 at home with new DM2 med routine. No problems with new Invokana, Metanx, or Crestor.  No other concerns/questions. Does want to start doing twice weekly BP checks as per his PCPs request once he starts new work schedule next week. We will plan to accomodate this.

## 2016-04-18 ENCOUNTER — Ambulatory Visit (INDEPENDENT_AMBULATORY_CARE_PROVIDER_SITE_OTHER): Payer: No Typology Code available for payment source | Admitting: Internal Medicine

## 2016-04-18 ENCOUNTER — Encounter: Payer: Self-pay | Admitting: Internal Medicine

## 2016-04-18 VITALS — BP 118/68 | HR 67 | Ht 74.0 in | Wt 324.8 lb

## 2016-04-18 DIAGNOSIS — R0609 Other forms of dyspnea: Secondary | ICD-10-CM

## 2016-04-18 NOTE — Assessment & Plan Note (Signed)
Stopped smoking 2005 -  PFTs 05/24/13  FEV1  2.54 s significant obst,  DLCO 74% and ERV 48% - PFT's  04/18/2016  FEV1 2.45  (62 % ) ratio 75  p 3 % improvement from saba p nothing prior to study and minimal curvature  with DLCO  Not done  But ERV 24%  - 04/18/2016  Walked RA x 3 laps @ 185 ft each stopped due to  End of study, fast pace,  no desat  / min sob     When respiratory symptoms begin or become refractory well after a patient reports complete smoking cessation,  Especially when this wasn't the case while they were smoking, a red flag is raised based on the work of Dr Kris Mouton which states:  if you quit smoking when your best day FEV1 is still well preserved (as his was and is) it is highly unlikely you will progress to severe disease.  That is to say, once the smoking stops,  the symptoms should not suddenly erupt or markedly worsen.  If so, the differential diagnosis should include  obesity/deconditioning,  LPR/Reflux/Aspiration syndromes,  occult CHF, or  especially side effect of medications commonly used in this population (eg ACEi, now off)   I see only obesity and restrictive changes related to it but certain he could have non acid GERD that triggered the acei intolerance so rec  Max rx for GERD Reconditioning CPST p on month if not making progress  Total time devoted to counseling  > 50 % of initial 60 min office visit:  review case with pt/ discussion of options/alternatives/ personally creating written customized instructions  in presence of pt  then going over those specific  Instructions directly with the pt including how to use all of the meds but in particular covering each new medication in detail and the difference between the maintenance= "automatic" meds and the prns using an action plan format for the latter (If this problem/symptom => do that organization reading Left to right).  Please see AVS from this visit for a full list of these instructions which I personally wrote for  this pt and  are unique to this visit.

## 2016-04-18 NOTE — Assessment & Plan Note (Signed)
Body mass index is 41.7 > no change rx  Lab Results  Component Value Date   TSH 1.31 10/16/2015     Contributing to gerd risk/ doe/reviewed the need and the process to achieve and maintain neg calorie balance > defer f/u primary care including intermittently monitoring thyroid status

## 2016-04-18 NOTE — Progress Notes (Signed)
Subjective:     Patient ID: Harold [name], male   DOB: Jun 28, 1955,     MRN: [id number]  HPI  43 yowm quit smoking 2005 with min copd on pfts 05/2013 with mostly restrictive changes related to obesity with wt around 340 baseline and able to do HT but WM without stopping then gradually since worse fall 2017 noted worse sob / cough while on ACEi which resolved but not the sob so referred to pulmonary clinic 04/18/2016 by Dr   Shelia Media    04/18/2016 1st [facility] office visit/ Pamela Intrieri   Chief Complaint  Patient presents with  . Pulm Consult    Was diagnosed with COPD back in January 2018. Pt. has a hard time moving around without getting SOB.   can still do HT but not at the speed he did previously/ can't to WM s stopping  Assoc with cough initially then resolved off acei   No obvious day to day or daytime variability or assoc excess/ purulent sputum or mucus plugs or hemoptysis or cp or chest tightness, subjective wheeze or overt sinus or hb symptoms. No unusual exp hx or h/o childhood pna/ asthma or knowledge of premature birth.  Sleeping ok without nocturnal  or early am exacerbation  of respiratory  c/o's or need for noct saba. Also denies any obvious fluctuation of symptoms with weather or environmental changes or other aggravating or alleviating factors except as outlined above   Current Medications, Allergies, Complete Past Medical History, Past Surgical History, Family History, and Social History were reviewed in [employer] record.  ROS  The following are not active complaints unless bolded sore throat, dysphagia, dental problems, itching, sneezing,  nasal congestion or excess/ purulent secretions, ear ache,   fever, chills, sweats, unintended wt loss, classically pleuritic or exertional cp,  orthopnea pnd or leg swelling, presyncope, palpitations, abdominal pain, anorexia, nausea, vomiting, diarrhea  or change in bowel or bladder habits, change in stools or  urine, dysuria,hematuria,  rash, arthralgias, visual complaints, headache, numbness, weakness or ataxia or problems with walking or coordination,  change in mood/affect or memory.            Review of Systems     Objective:   Physical Exam    amb obese very pleasant wm nad    Wt Readings from Last 3 Encounters:  04/18/16 (!) 324 lb 12.8 oz (147.3 kg)  04/08/16 (!) 322 lb (146.1 kg)  04/02/16 (!) 331 lb (150.1 kg)    Vital signs reviewed - Note on arrival 02 sats  98% on RA     HEENT: nl dentition, turbinates bilaterally, and oropharynx. Nl external ear canals without cough reflex   NECK :  without JVD/Nodes/TM/ nl carotid upstrokes bilaterally   LUNGS: no acc muscle use,  Nl contour chest which is clear to A and P bilaterally without cough on insp or exp maneuvers   CV:  RRR  no s3 or murmur or increase in P2, and no edema   ABD:  soft and nontender with nl inspiratory excursion in the supine position. No bruits or organomegaly appreciated, bowel sounds nl  MS:  Nl gait/ ext warm without deformities, calf tenderness, cyanosis or clubbing No obvious joint restrictions   SKIN: warm and dry without lesions    NEURO:  alert, approp, nl sensorium with  no motor or cerebellar deficits apparent.     cxr reported nl /labs nl per Dr Shelia Media since onset of sob  Assessment:

## 2016-04-18 NOTE — Patient Instructions (Addendum)
Be sure take nexium 40 mg Take 30-60 min before first meal of the day   GERD (REFLUX)  is an extremely common cause of respiratory symptoms just like yours , many times with no obvious heartburn at all.    It can be treated with medication, but also with lifestyle changes including elevation of the head of your bed (ideally with 6 inch  bed blocks),  Smoking cessation, avoidance of late meals, excessive alcohol, and avoid fatty foods, chocolate, peppermint, colas, red wine, and acidic juices such as orange juice.  NO MINT OR MENTHOL PRODUCTS SO NO COUGH DROPS   USE SUGARLESS CANDY INSTEAD (Jolley ranchers or Stover's or Life Savers) or even ice chips will also do - the key is to swallow to prevent all throat clearing. NO OIL BASED VITAMINS - use powdered substitutes.   Weight control is simply a matter of calorie balance which needs to be tilted in your favor by eating less and exercising more.  To get the most out of exercise, you need to be continuously aware that you are short of breath, but never out of breath, for 30 minutes daily. As you improve, it will actually be easier for you to do the same amount of exercise  in  30 minutes so always push to the level where you are short of breath.  If this does not result in gradual weight reduction then I strongly recommend you see a nutritionist with a food diary x 2 weeks so that we can work out a negative calorie balance which is universally effective in steady weight loss programs.  Think of your calorie balance like you do your bank account where in this case you want the balance to go down so you must take in less calories than you burn up.  It's just that simple:  Hard to do, but easy to understand.  Good luck!   -If not improving after a month I recommend a CPST > call [facility] to schedule at [phone]

## 2016-04-22 ENCOUNTER — Ambulatory Visit: Payer: Self-pay | Admitting: *Deleted

## 2016-04-22 VITALS — BP 128/72 | Wt 326.0 lb

## 2016-04-22 DIAGNOSIS — E118 Type 2 diabetes mellitus with unspecified complications: Secondary | ICD-10-CM

## 2016-04-22 DIAGNOSIS — Z794 Long term (current) use of insulin: Secondary | ICD-10-CM

## 2016-04-22 DIAGNOSIS — I10 Essential (primary) hypertension: Secondary | ICD-10-CM

## 2016-04-22 NOTE — Progress Notes (Signed)
Pt in for weekly check in for BP and Wt check. Reports seeing pulmonology last week and being told he does not have COPD like he thought, that problems are weight/obesity related. He is glad to hear this. Believes being on his new schedule at work will help his diet and eating schedule, leading to improved wt loss. Colonoscopy and sleep study scheduled next week. Plans to begin twice weekly check ins with RN. Appt made for Thursday. No further questions/concerns, or changes to discuss per pt.

## 2016-04-25 ENCOUNTER — Ambulatory Visit: Payer: Self-pay | Admitting: *Deleted

## 2016-04-25 VITALS — BP 142/62

## 2016-04-25 DIAGNOSIS — I10 Essential (primary) hypertension: Secondary | ICD-10-CM

## 2016-04-25 NOTE — Progress Notes (Signed)
BP check

## 2016-05-06 ENCOUNTER — Ambulatory Visit: Payer: Self-pay | Admitting: *Deleted

## 2016-05-06 VITALS — BP 164/78 | Wt 331.0 lb

## 2016-05-06 DIAGNOSIS — I10 Essential (primary) hypertension: Secondary | ICD-10-CM

## 2016-05-06 DIAGNOSIS — E118 Type 2 diabetes mellitus with unspecified complications: Secondary | ICD-10-CM

## 2016-05-06 DIAGNOSIS — Z794 Long term (current) use of insulin: Principal | ICD-10-CM

## 2016-05-06 NOTE — Progress Notes (Signed)
Pt in for BP and Wt check. BP elevated. ShOB r/t walking from dept to clinic. Improves with rest. Wt up, 5# since last check 2 weeks ago. Reports not taking Lasix yesterday. Has taken already today. Reports working in yard this past weekend, c/o back pain r/t "overdoing it." Also improves with rest. Blood sugars running between 140-180 majority of the time except first check of morning, 250. F/u with endocrinology next week.

## 2016-05-09 ENCOUNTER — Ambulatory Visit: Payer: Self-pay | Admitting: *Deleted

## 2016-05-09 VITALS — BP 146/70 | Wt 334.0 lb

## 2016-05-09 DIAGNOSIS — E118 Type 2 diabetes mellitus with unspecified complications: Secondary | ICD-10-CM

## 2016-05-09 DIAGNOSIS — I10 Essential (primary) hypertension: Secondary | ICD-10-CM

## 2016-05-09 DIAGNOSIS — Z794 Long term (current) use of insulin: Secondary | ICD-10-CM

## 2016-05-09 NOTE — Progress Notes (Signed)
Pt in for weekly BP and Wt check. Weight trending up. 334# today. Reports 2 [employer] fried chicken biscuits for breakfast almost daily. CBGs improving, 250's in AMs, 140's-230s throughout most days. Reports he is going to discuss BP with provider this afternoon to see if switching back to lisinopril is an option since COPD Dx was removed by pulmonology.

## 2016-05-10 ENCOUNTER — Ambulatory Visit (INDEPENDENT_AMBULATORY_CARE_PROVIDER_SITE_OTHER): Payer: PRIVATE HEALTH INSURANCE | Admitting: Neurology

## 2016-05-10 DIAGNOSIS — N182 Chronic kidney disease, stage 2 (mild): Secondary | ICD-10-CM

## 2016-05-10 DIAGNOSIS — G4733 Obstructive sleep apnea (adult) (pediatric): Secondary | ICD-10-CM | POA: Diagnosis not present

## 2016-05-10 DIAGNOSIS — I2510 Atherosclerotic heart disease of native coronary artery without angina pectoris: Secondary | ICD-10-CM

## 2016-05-10 DIAGNOSIS — I2723 Pulmonary hypertension due to lung diseases and hypoxia: Secondary | ICD-10-CM

## 2016-05-10 DIAGNOSIS — J449 Chronic obstructive pulmonary disease, unspecified: Principal | ICD-10-CM

## 2016-05-10 DIAGNOSIS — IMO0001 Reserved for inherently not codable concepts without codable children: Secondary | ICD-10-CM

## 2016-05-10 DIAGNOSIS — R0602 Shortness of breath: Secondary | ICD-10-CM

## 2016-05-15 NOTE — Procedures (Signed)
PATIENT'S NAME:  Harold [name], Harold [name] DOB:      [birth year]      MR#:    [id number]     DATE OF RECORDING: 05/10/2016 REFERRING M.D.:  Arlyss Queen, MD Study Performed:  Split-Night Titration Study HISTORY: morbid obesity,  CAD, renal insufficiency, diabetes, GERD , Chest pain,  HTN, Hypothyroidism, neuropathy, pulmonary hypertension, Carotid artery disease, CEA,  COPD, sleep apnea and current CPAP user.  The patient endorsed the Epworth Sleepiness Scale at 9 points and FSS at 54 points, PHQ 9 at 2 points.    The patient's weight 328 pounds with a height of 73 (inches), resulting in a BMI of 43.5 kg/m2. The patient's neck circumference measured 21 inches.  CURRENT MEDICATIONS: Brilinta, Coreg, Lexapro, Nexium, Plendil, Lasix, Novolog, Levemir, Synthroid, Nitrostat, Trulicity PROCEDURE:  This is a multichannel digital polysomnogram utilizing the Somnostar 11.2 system.  Electrodes and sensors were applied and monitored per AASM Specifications.   EEG, EOG, Chin and Limb EMG, were sampled at 200 Hz.  ECG, Snore and Nasal Pressure, Thermal Airflow, Respiratory Effort, CPAP Flow and Pressure, Oximetry was sampled at 50 Hz. Digital video and audio were recorded.      BASELINE STUDY WITHOUT CPAP RESULTS: Lights Out was at 21:13 and Lights On at 05:06.  Total recording time (TRT) was 145.5, with a total sleep time (TST) of 66.5 minutes.   The patient's sleep latency was 47.5 minutes.  REM latency was 51 minutes.  The sleep efficiency was 45.7 %.    SLEEP ARCHITECTURE: WASO (Wake after sleep onset) was 31.5 minutes, Stage N1 was 21.5 minutes, Stage N2 was 26.5 minutes, Stage N3 was 0 minutes and Stage R (REM sleep) was 18.5 minutes.  The percentages were Stage N1 32.3%, Stage N2 39.8%, Stage N3 0% and Stage R (REM sleep) 27.8%.   RESPIRATORY ANALYSIS:  There were a total of 70 respiratory events:  31 obstructive apneas, 39 hypopneas with 1 respiratory event related arousal (RERAs). Loud snoring was noted.   The  total APNEA/HYPOPNEA INDEX (AHI) was 63.2 /hour and the total RESPIRATORY DISTURBANCE INDEX was 64.1 /hour.  24 events occurred in REM sleep and 72 events in NREM. The REM AHI was 77.8, /hour versus a non-REM AHI of 57.5 /hour. The patient spent 93 minutes sleep time in the supine position 236 minutes in non-supine. The supine AHI was 60.0 /hour versus a non-supine AHI of 63.4 /hour.  OXYGEN SATURATION & C02:  The wake baseline 02 saturation was 95%, with the lowest being 82%. Time spent below 89% saturation equaled 21 minutes.  PERIODIC LIMB MOVEMENTS:   The patient had a total of 0 Periodic Limb Movements.  The arousals were noted as: 11 were spontaneous, 0 were associated with PLMs, and 24 were associated with respiratory events. Audio and video analysis did not show any abnormal or unusual movements, behaviors, phonations or vocalizations. The patient took 3 bathroom breaks. Snoring was noted. EKG was in keeping with normal sinus rhythm (NSR)  TITRATION STUDY WITH CPAP RESULTS: CPAP was initiated at 5 cmH20 with heated humidity per AASM split night standards and pressure was advanced to 11 cmH20 with 3 cm EPR because of hypopneas, apneas and snoring.  At a PAP pressure of 11 cmH20, there was a reduction of the AHI to 0.0 /hour with a total sleep time of 115.5 minutes and SpO2 nadir at 89%..  Total recording time (TRT) was 327.5 minutes, with a total sleep time (TST) of 262.5 minutes. The patient's sleep latency  was 27.5 minutes. REM latency was 285 minutes.  The sleep efficiency was 80.2 %.    SLEEP ARCHITECTURE: Wake after sleep was 53 minutes, Stage N1 58 minutes, Stage N2 112.5 minutes, Stage N3 64 minutes and Stage R (REM sleep) 28 minutes. The percentages were: Stage N1 22.1%, Stage N2 42.9%, Stage N3 24.4% and Stage R (REM sleep) 10.7%.   RESPIRATORY ANALYSIS:  There were a total of 35 respiratory events: There were 35 hypopneas with a hypopnea index of 8.0 /hour. The patient also had 0  respiratory event related arousals (RERAs).     The total APNEA/HYPOPNEA INDEX (AHI) was 8.0 /hour and the total RESPIRATORY DISTURBANCE INDEX was 8 /hour.  0 events occurred in REM sleep and 35 events in NREM. The patient spent 34% of total sleep time in the supine position. The supine AHI was 1.3 /hour, versus a non-supine AHI of 11.4/hour.  OXYGEN SATURATION & C02:  The wake baseline 02 saturation was 93%, with the lowest being 85%. Time spent below 89% saturation equaled 2 minutes.  PERIODIC LIMB MOVEMENTS:   The patient had a total of 0 Periodic Limb Movements. The arousals were noted as: 11 were spontaneous, 0 were associated with PLMs, and 24 were associated with respiratory events Audio and video analysis did not show any abnormal or unusual movements, behaviors, phonations or vocalizations. EKG was in keeping with normal sinus rhythm (NSR) .Post-study, the patient indicated that sleep was the same as usual.  POLYSOMNOGRAPHY IMPRESSION : Severe Obstructive Sleep Apnea (OSA) at AHI of 63.2/hr. with minimal supine and /or REM accentuation which responded well to CPAP therapy at 11 cm water pressure.  Complete resolution of apnea and snoring was achieved.   RECOMMENDATIONS:  1. I advise to start the patient at CPAP of 11 cmH2O and 3 cm EPR with heated humidity. A ResMed Airfit N 20 nasal mask in large size was used during this study.  Advise to add heated humidity.  Adjust interface and heated humidity as needed.     2. Compliance to PAP therapy should be emphasized.   3. A follow up appointment will be scheduled in the Sleep Clinic at [facility].     I certify that I have reviewed the entire raw data recording prior to the issuance of this report in accordance with the Standards of Accreditation of the American Academy of Sleep Medicine (AASM)  Larey Seat, M.D.  05-15-2016  Diplomat, American Board of Psychiatry and Neurology  Diplomat, [place] of Sleep  Medicine Medical Director of [facility] at [facility]

## 2016-05-15 NOTE — Addendum Note (Signed)
Addended by: Larey Seat on: 05/15/2016 05:10 PM   Modules accepted: Orders

## 2016-05-16 ENCOUNTER — Telehealth: Payer: Self-pay

## 2016-05-16 NOTE — Telephone Encounter (Signed)
I called pt. I advised him that Dr. Brett Fairy reviewed his sleep study and found that pt does have severe osa which responded well to CPAP therapy and complete resolution of apnea and snoring was achieved. Pt is agreeable to starting cpap. Pt says that he uses [facility]. I reviewed cpap compliance expectations with the pt. A follow up appt was made for 08/20/2016 at 1:30pm. Pt verbalized understanding of results. Pt had no questions at this time but was encouraged to call back if questions arise.

## 2016-05-16 NOTE — Telephone Encounter (Signed)
-----  Message from Larey Seat, MD sent at 05/15/2016  5:10 PM EDT ----- POLYSOMNOGRAPHY IMPRESSION : Severe Obstructive Sleep Apnea (OSA) at AHI of 63.2/hr. with minimal supine and /or REM accentuation which responded well to CPAP therapy at 11 cm water pressure.  Complete resolution of apnea and snoring was achieved. There was no clinically significant hypoxemia.   RECOMMENDATIONS:  1. I advise to start the patient at CPAP of 11 cmH2O and 3 cm EPR with heated humidity. A ResMed Airfit N 20 nasal mask in large size was used during this study.  Advise to add heated humidity.  Adjust interface and heated humidity as needed.     2. Compliance to PAP therapy should be emphasized.   3. A follow up appointment will be scheduled in the Sleep Clinic at [facility].

## 2016-05-17 ENCOUNTER — Ambulatory Visit: Payer: Self-pay | Admitting: *Deleted

## 2016-05-17 VITALS — BP 135/72 | Wt 326.0 lb

## 2016-05-17 DIAGNOSIS — I10 Essential (primary) hypertension: Secondary | ICD-10-CM

## 2016-05-17 DIAGNOSIS — Z794 Long term (current) use of insulin: Secondary | ICD-10-CM

## 2016-05-17 DIAGNOSIS — E118 Type 2 diabetes mellitus with unspecified complications: Secondary | ICD-10-CM

## 2016-05-17 NOTE — Progress Notes (Signed)
Weekly BP and weight check.

## 2016-05-21 ENCOUNTER — Ambulatory Visit: Payer: Self-pay | Admitting: *Deleted

## 2016-05-21 VITALS — BP 154/64 | Wt 328.0 lb

## 2016-05-24 ENCOUNTER — Ambulatory Visit: Payer: Self-pay | Admitting: *Deleted

## 2016-05-24 VITALS — BP 144/54 | Wt 334.0 lb

## 2016-05-24 DIAGNOSIS — I10 Essential (primary) hypertension: Secondary | ICD-10-CM

## 2016-05-24 DIAGNOSIS — E118 Type 2 diabetes mellitus with unspecified complications: Secondary | ICD-10-CM

## 2016-05-24 DIAGNOSIS — Z794 Long term (current) use of insulin: Secondary | ICD-10-CM

## 2016-05-24 NOTE — Progress Notes (Signed)
Weekly BP and Wt check. Sts he did not eat well or take insulin or meds as scheduled yesterday due to meetings all day. Wt up by 6# from 3 days ago. Pt reports "water pill hasn't kicked in yet." Denies ShOB or LE edema. Scheduled for next check in 3 days.

## 2016-05-27 ENCOUNTER — Ambulatory Visit: Payer: Self-pay | Admitting: *Deleted

## 2016-05-27 VITALS — BP 122/64 | Wt 330.0 lb

## 2016-05-27 DIAGNOSIS — Z794 Long term (current) use of insulin: Secondary | ICD-10-CM

## 2016-05-27 DIAGNOSIS — E118 Type 2 diabetes mellitus with unspecified complications: Secondary | ICD-10-CM

## 2016-05-27 DIAGNOSIS — I10 Essential (primary) hypertension: Secondary | ICD-10-CM

## 2016-05-27 NOTE — Progress Notes (Signed)
Weekly BP and Wt check.  

## 2016-05-31 ENCOUNTER — Ambulatory Visit: Payer: Self-pay | Admitting: *Deleted

## 2016-05-31 VITALS — BP 170/64

## 2016-05-31 DIAGNOSIS — I10 Essential (primary) hypertension: Secondary | ICD-10-CM

## 2016-05-31 DIAGNOSIS — Z794 Long term (current) use of insulin: Secondary | ICD-10-CM

## 2016-05-31 DIAGNOSIS — E118 Type 2 diabetes mellitus with unspecified complications: Secondary | ICD-10-CM

## 2016-05-31 NOTE — Progress Notes (Signed)
Pt in for twice weekly BP check. Elevated. Reports cbgs at home elevated. Majority still under 250, but not in the 100's as he was a few weeks ago. Reports poor diet over last few weeks, "daily life and stress eating." Counseled on need to stick to healthy diet consistently to see lasting effects. Pt verbalizes understanding and agreement. Refuses weight check today, "once a week is enough." Appt scheduled for Monday for next check in.

## 2016-06-03 ENCOUNTER — Ambulatory Visit: Payer: Self-pay | Admitting: *Deleted

## 2016-06-07 ENCOUNTER — Ambulatory Visit: Payer: Self-pay | Admitting: *Deleted

## 2016-06-07 VITALS — BP 118/70 | HR 64 | Wt 331.0 lb

## 2016-06-07 DIAGNOSIS — Z794 Long term (current) use of insulin: Secondary | ICD-10-CM

## 2016-06-07 DIAGNOSIS — E118 Type 2 diabetes mellitus with unspecified complications: Secondary | ICD-10-CM

## 2016-06-07 DIAGNOSIS — I10 Essential (primary) hypertension: Secondary | ICD-10-CM

## 2016-06-07 NOTE — Progress Notes (Signed)
Weekly BP and Wt check. Lost power Sun night- Tues afternoon. Has been eating poorly at fast food as they lost all their food in fridge and freezer. Reports insulin was in fridge, but they kept the door shut and he feels it stayed cool enough and solution is still clear and colorless. Has not been checking CBGs. Has been taking insulin shots and other meds. Refuses CBG check in clinic today, reports he will check it himself later today.

## 2016-06-09 ENCOUNTER — Other Ambulatory Visit: Payer: Self-pay | Admitting: Physician Assistant

## 2016-06-09 ENCOUNTER — Other Ambulatory Visit: Payer: Self-pay | Admitting: Cardiology

## 2016-06-10 ENCOUNTER — Ambulatory Visit: Payer: Self-pay | Admitting: *Deleted

## 2016-06-10 VITALS — BP 140/78 | HR 63 | Wt 330.0 lb

## 2016-06-10 DIAGNOSIS — E118 Type 2 diabetes mellitus with unspecified complications: Secondary | ICD-10-CM

## 2016-06-10 DIAGNOSIS — I10 Essential (primary) hypertension: Secondary | ICD-10-CM

## 2016-06-10 DIAGNOSIS — Z794 Long term (current) use of insulin: Secondary | ICD-10-CM

## 2016-06-10 NOTE — Progress Notes (Signed)
Weekly BP and Wt check. CBG in 300's this am. Has taken one dose insulin. Will dose again at lunch.

## 2016-06-14 ENCOUNTER — Ambulatory Visit: Payer: Self-pay | Admitting: *Deleted

## 2016-06-14 VITALS — BP 153/71 | HR 63 | Wt 335.0 lb

## 2016-06-14 DIAGNOSIS — E118 Type 2 diabetes mellitus with unspecified complications: Secondary | ICD-10-CM

## 2016-06-14 DIAGNOSIS — I10 Essential (primary) hypertension: Secondary | ICD-10-CM

## 2016-06-14 DIAGNOSIS — Z794 Long term (current) use of insulin: Secondary | ICD-10-CM

## 2016-06-18 NOTE — Progress Notes (Signed)
Late entry: 06-14-16 1310: Pt in for weekly check in. Reports legs have been fatigued r/t bil hip pain. Taking 853m Ibuprofen BID. Seeing hand surgeon next week. Going to request an ortho referral for hip pain 2/2 presumed arthritis.  CBG's have been trending upwards ~200's. Asymptomatic per pt. Poor diet choices. Weight up 5# in 4 days. Plans to take extra Lasix dose until back to ~330-331. No edema noted. Denies ShOB worse from baseline.

## 2016-06-19 ENCOUNTER — Other Ambulatory Visit: Payer: Self-pay

## 2016-06-19 NOTE — Telephone Encounter (Signed)
Fax req [facility] Esc[facility]ram 7m Already pended to [facility]

## 2016-06-19 NOTE — Telephone Encounter (Signed)
Last visit with Dr. Everlene Farrier 09/2015 says that patient will be following up with Dr. Nyoka Cowden as PCP. Dr. Janeann Forehand?  Chart indicates that PCP is Dr. Deland Pretty, beginning 03/29/2016.  Please clarify PCP with patient. If will be following up here, OK to authorize a 30-day supply, and help patient schedule with Dr. Carlota Raspberry. Otherwise, this refill should go to his new PCP.

## 2016-06-20 NOTE — Telephone Encounter (Signed)
n/a with pt l/m with pharmacy about new pcp

## 2016-07-13 ENCOUNTER — Ambulatory Visit (INDEPENDENT_AMBULATORY_CARE_PROVIDER_SITE_OTHER): Payer: PRIVATE HEALTH INSURANCE | Admitting: Family Medicine

## 2016-07-13 ENCOUNTER — Encounter: Payer: Self-pay | Admitting: Family Medicine

## 2016-07-13 VITALS — BP 134/63 | HR 72 | Temp 98.5°F | Resp 16 | Ht 73.0 in | Wt 329.2 lb

## 2016-07-13 DIAGNOSIS — J069 Acute upper respiratory infection, unspecified: Secondary | ICD-10-CM

## 2016-07-13 DIAGNOSIS — E1149 Type 2 diabetes mellitus with other diabetic neurological complication: Secondary | ICD-10-CM

## 2016-07-13 LAB — POCT CBC
Granulocyte percent: 74.5 %G (ref 37–80)
HCT, POC: 40.3 % — AB (ref 43.5–53.7)
Hemoglobin: 13.9 g/dL — AB (ref 14.1–18.1)
Lymph, poc: 1.7 (ref 0.6–3.4)
MCH, POC: 30.8 pg (ref 27–31.2)
MCHC: 34.6 g/dL (ref 31.8–35.4)
MCV: 89.2 fL (ref 80–97)
MID (cbc): 0.2 (ref 0–0.9)
MPV: 9 fL (ref 0–99.8)
PLATELET COUNT, POC: 162 10*3/uL (ref 142–424)
POC Granulocyte: 5.8 (ref 2–6.9)
POC LYMPH PERCENT: 22.3 %L (ref 10–50)
POC MID %: 3.2 %M (ref 0–12)
RBC: 4.52 M/uL — AB (ref 4.69–6.13)
RDW, POC: 14 %
WBC: 7.8 10*3/uL (ref 4.6–10.2)

## 2016-07-13 LAB — GLUCOSE, POCT (MANUAL RESULT ENTRY): POC GLUCOSE: 273 mg/dL — AB (ref 70–99)

## 2016-07-13 MED ORDER — AZITHROMYCIN 250 MG PO TABS: ORAL_TABLET | ORAL | 0 refills | Status: DC

## 2016-07-13 NOTE — Patient Instructions (Addendum)
IF you received an x-ray today, you will receive an invoice from [facility]. Please contact [facility] at [phone] with questions or concerns regarding your invoice.   IF you received labwork today, you will receive an invoice from [facility]. Please contact [facility] at [phone] with questions or concerns regarding your invoice.   Our billing staff will not be able to assist you with questions regarding bills from these companies.  You will be contacted with the lab results as soon as they are available. The fastest way to get your results is to activate your My Chart account. Instructions are located on the last page of this paperwork. If you have not heard from [place] regarding the results in 2 weeks, please contact this office.      Upper Respiratory Infection, Adult Most upper respiratory infections (URIs) are a viral infection of the air passages leading to the lungs. A URI affects the nose, throat, and upper air passages. The most common type of URI is nasopharyngitis and is typically referred to as "the common cold." URIs run their course and usually go away on their own. Most of the time, a URI does not require medical attention, but sometimes a bacterial infection in the upper airways can follow a viral infection. This is called a secondary infection. Sinus and middle ear infections are common types of secondary upper respiratory infections. Bacterial pneumonia can also complicate a URI. A URI can worsen asthma and chronic obstructive pulmonary disease (COPD). Sometimes, these complications can require emergency medical care and may be life threatening. What are the causes? Almost all URIs are caused by viruses. A virus is a type of germ and can spread from one person to another. What increases the risk? You may be at risk for a URI if:  You smoke.  You have chronic heart or lung disease.  You have a weakened defense (immune) system.  You are very  young or very old.  You have nasal allergies or asthma.  You work in crowded or poorly ventilated areas.  You work in health care facilities or schools. What are the signs or symptoms? Symptoms typically develop 2-3 days after you come in contact with a cold virus. Most viral URIs last 7-10 days. However, viral URIs from the influenza virus (flu virus) can last 14-18 days and are typically more severe. Symptoms may include:  Runny or stuffy (congested) nose.  Sneezing.  Cough.  Sore throat.  Headache.  Fatigue.  Fever.  Loss of appetite.  Pain in your forehead, behind your eyes, and over your cheekbones (sinus pain).  Muscle aches. How is this diagnosed? Your health care provider may diagnose a URI by:  Physical exam.  Tests to check that your symptoms are not due to another condition such as:  Strep throat.  Sinusitis.  Pneumonia.  Asthma. How is this treated? A URI goes away on its own with time. It cannot be cured with medicines, but medicines may be prescribed or recommended to relieve symptoms. Medicines may help:  Reduce your fever.  Reduce your cough.  Relieve nasal congestion. Follow these instructions at home:  Take medicines only as directed by your health care provider.  Gargle warm saltwater or take cough drops to comfort your throat as directed by your health care provider.  Use a warm mist humidifier or inhale steam from a shower to increase air moisture. This may make it easier to breathe.  Drink enough fluid to keep your urine clear  or pale yellow.  Eat soups and other clear broths and maintain good nutrition.  Rest as needed.  Return to work when your temperature has returned to normal or as your health care provider advises. You may need to stay home longer to avoid infecting others. You can also use a face mask and careful hand washing to prevent spread of the virus.  Increase the usage of your inhaler if you have asthma.  Do not  use any tobacco products, including cigarettes, chewing tobacco, or electronic cigarettes. If you need help quitting, ask your health care provider. How is this prevented? The best way to protect yourself from getting a cold is to practice good hygiene.  Avoid oral or hand contact with people with cold symptoms.  Wash your hands often if contact occurs. There is no clear evidence that vitamin C, vitamin E, echinacea, or exercise reduces the chance of developing a cold. However, it is always recommended to get plenty of rest, exercise, and practice good nutrition. Contact a health care provider if:  You are getting worse rather than better.  Your symptoms are not controlled by medicine.  You have chills.  You have worsening shortness of breath.  You have brown or red mucus.  You have yellow or brown nasal discharge.  You have pain in your face, especially when you bend forward.  You have a fever.  You have swollen neck glands.  You have pain while swallowing.  You have white areas in the back of your throat. Get help right away if:  You have severe or persistent:  Headache.  Ear pain.  Sinus pain.  Chest pain.  You have chronic lung disease and any of the following:  Wheezing.  Prolonged cough.  Coughing up blood.  A change in your usual mucus.  You have a stiff neck.  You have changes in your:  Vision.  Hearing.  Thinking.  Mood. This information is not intended to replace advice given to you by your health care provider. Make sure you discuss any questions you have with your health care provider. Document Released: 07/31/2000 Document Revised: 10/08/2015 Document Reviewed: 05/12/2013 Elsevier Interactive Patient Education  2017 [employer].

## 2016-07-13 NOTE — Progress Notes (Signed)
Chief Complaint  Patient presents with  . Cough    SOB, FATIGUE,     HPI  Acute URI Pt reports that he started coughing on Thursday He denies fevers or chills He describes his symptoms as right side face pressure, ear pain and right side drainage down his throat Cough is sticky with yellow brown sputum He has been drinking water He denies any chest pains or palpitations He drinks about 2L of diet coke a day  Diabetes Mellitus  He is also a diabetic who is on insulin He reports that his blood glucose is increasing despite staying hydrated and taking meds He is sticking to a diabetic diet  Past Medical History:  Diagnosis Date  . Anginal pain [facility]) March 2015   Cardiac cath showed patent stents with distal LAD, circumflex-OM and RCA disease in small vessels.  . Anxiety   . CAD S/P percutaneous coronary angioplasty 2003, 04/2012   status post PCI to LAD, circumflex-OM 2, RCA  . Carotid artery occlusion   . Chronic renal insufficiency, stage II (mild)   . Chronic venous insufficiency    varicosities, no reflux; dopplers 04/14/12- valvular insufficiency in the R and L GSV  . Complication of anesthesia   . COPD, mild ([place])   . Depression    situaltional   . Diabetes mellitus   . Dyslipidemia associated with type 2 diabetes mellitus ([place])   . GERD (gastroesophageal reflux disease)   . HTN (hypertension)   . Hypothyroidism   . Neuromuscular disorder (HCC)    neuropathy in feet  . Neuropathy    notably improved following PCI with improved cardiac function  . Obesity   . 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 PONV (postoperative nausea and vomiting)    "[place]"  . Pulmonary hypertension ([place]) 04/2012   PA pressure 77mhg  . Sleep apnea    uses nightly    Current Outpatient Prescriptions  Medication Sig Dispense Refill  . BRILINTA 90 MG TABS tablet TAKE 1 TABLET BY MOUTH 2 TIMES  DAILY. KEEP OFFFICE VISIT. 60 tablet 4  . canagliflozin (INVOKANA) 100 MG TABS tablet Take 100 mg by mouth daily before breakfast.    . carvedilol (COREG) 6.25 MG tablet TAKE 1 TABLET (6.25 MG TOTAL) BY MOUTH 2 (TWO) TIMES DAILY. 60 tablet 9  . Choline Fenofibrate (FENOFIBRIC ACID) 135 MG CPDR Take 1 capsule by mouth daily.  5  . escitalopram (LEXAPRO) 20 MG tablet TAKE 1 TABLET (20 MG TOTAL) BY MOUTH DAILY. 30 tablet 1  . furosemide (LASIX) 80 MG tablet Take 80 mg by mouth daily.  6  . insulin aspart (NOVOLOG) 100 UNIT/ML injection Inject 34 Units into the skin 3 (three) times daily with meals. IN ADDITION 25 MG ONCE ADAY    . Insulin Detemir (LEVEMIR) 100 UNIT/ML Pen Inject 60 Units into the skin 2 (two) times daily. 60 units twice a day    . L-Methylfolate-Algae-B12-B6 (METANX PO) Take by mouth 2 (two) times daily.    .Marland KitchenLANTUS SOLOSTAR 100 UNIT/ML Solostar Pen INJECT 60 UNITS SUBCUTANEOUSLY TWICE DAILY  3  . levothyroxine (SYNTHROID, LEVOTHROID) 88 MCG tablet Take 88 mcg by mouth daily before breakfast.    . NITROSTAT 0.4 MG SL tablet PLACE 1 TAB UNDER THE TONGUE EVERY 5 MINUTES AS NEEDED FOR CHEST PAIN (SEVERE PRESSURE OR TIGHTNESS) 25 tablet 3  . telmisartan (MICARDIS) 80  MG tablet Take 80 mg by mouth daily.  0  . TRULICITY 1.5 DT/2.6ZT SOPN Inject 1.5 mg into the skin once a week.  5  . azithromycin (ZITHROMAX) 250 MG tablet Take 2 tabs by mouth day 1, then one tab each day after 6 tablet 0   No current facility-administered medications for this visit.     Allergies:  Allergies  Allergen Reactions  . Septra [Bactrim] Itching  . Penicillins Hives    Allergy to All cillin drugs  Ancef given 9/24 with no obvious reaction  . Clopidogrel     NON RESPONDER  P2Y12  . Metformin And Related   . Other     Walnuts, pecans     Past Surgical History:  Procedure Laterality Date  . ABDOMINAL AORTAGRAM N/A 11/15/2011   Procedure: ABDOMINAL Maxcine Ham;  Surgeon: Elam Dutch, MD;   Location: [facility] C[facility] LAB;  Service: Cardiovascular;  [name];  . ABIs  04/27/2012   mild bilateral arterial insufficiency  . BACK SURGERY  2005 x1   X2-2010  . BUNIONECTOMY Right 11/11/2013   Procedure: RIGHT FOOT SILVER BUNIONECTOMY;  Surgeon: Wylene Simmer, MD;  Location: [place];  Service: Orthopedics;  Laterality: Right;  . CARDIAC CATHETERIZATION  12/11/2001   significant 3V CAD, normal LV function  . CARDIAC CATHETERIZATION  March 2015   Moderate Pulm HTN: 46/16 - mean 33 mmHg; PCWP 727mHg;; [name], widely patent proximal and mid cervical extends with previous subtotal occlusion of smaller tube, I be patent RCA ostial mid and distal stents with 70% distal continuation RCA disease  . Carotid Doppler  04/27/2012   right internal carotid: Elevated velocities but no evidence of plaque. Left internal carotid 40-59%  . CAROTID ENDARTERECTOMY  2005   Right; recent carotid Dopplers notes elevated velocities.   . colonscopy    . CORONARY ANGIOPLASTY  12/21/2001   PTCA of the distal and mid AV groove circ, unsuccessful PTCA of second OM total occlusion, unsuccessful PTCA of the apical LAD total occlusion  . CORONARY ANGIOPLASTY WITH STENT PLACEMENT  04/29/12   PCI to 3 RCA lesions, Promus Premiere 2.232m8mm distally, mid was 2.27m90mmm and proximally 2.75x16m47mF 55-60%  . CORONARY STENT PLACEMENT  04/28/12   PCI to LAD (3x23mm19mnce DES postdilated to 3.25) and circ prox and mid (2 overlappinmg 2.27mmx1107mXience DES postdilated to 2.727mm) 20mOPPLER ECHOCARDIOGRAPHY  04/28/2012   poor quality study: EF estimated 60-65%; unable to assess diastolic function (previously noted to have diastolic dysfunction); severely dilated left atrium and mild right atrium; dilated IVC consistent with increased central venous pressure..  . HERNIA REPAIR    . LEFT AND RIGHT HEART CATHETERIZATION WITH CORONARY ANGIOGRAM N/A 04/27/2012    Procedure: LEFT AND RIGHT HEART CATHETERIZATION WITH CORONARY ANGIOGRAM;  Surgeon: David WLeonie Man[facility];  Service: Cardiovascular;  [name];  . LEFT AND RIGHT HEART CATHETERIZATION WITH CORONARY ANGIOGRAM N/A 05/14/2013   Procedure: LEFT AND RIGHT HEART CATHETERIZATION WITH CORONARY ANGIOGRAM;  Surgeon: Thomas Troy SineLocation[facility];  Service: Cardiovascular;  [name];  . LUMBAR LAMINECTOMY/DECOMPRESSION MICRODISCECTOMY  01/07/2012   Procedure: LUMBAR LAMINECTOMY/DECOMPRESSION MICRODISCECTOMY 1 LEVEL;  Surgeon: Kyle L Winfield CunasLocation: [facility]UR[facility];  Service: Neurosurgery;  Laterality: Bilateral;   Lumbar Three-Four Decompression  . PERCUTANEOUS CORONARY STENT INTERVENTION (PCI-S) N/A 04/28/2012   Procedure: PERCUTANEOUS CORONARY STENT INTERVENTION (PCI-S);  Surgeon: Troy Sine, MD;  Location: [facility] CATH LAB;  Service: Cardiovascular;  [name];  . PERCUTANEOUS CORONARY STENT INTERVENTION (PCI-S) N/A 04/29/2012   Procedure: PERCUTANEOUS CORONARY STENT INTERVENTION (PCI-S);  Surgeon: Leonie Man, MD;  Location: [facility] CATH LAB;  Service: Cardiovascular;  [name];  . SPINE SURGERY    . UMBILICAL HERNIA REPAIR  2009   steel mesh insert    Social History   Social History  . Marital status: Single    Spouse name: N/A  . Number of children: 0  . Years of education: hs   Occupational History  . [employer]   .  [employer]   Social History Main Topics  . Smoking status: Former Smoker    Packs/day: 1.00    Years: 42.00    Types: Cigarettes    Quit date: 03/23/2003  . Smokeless tobacco: Never Used  . Alcohol use No  . Drug use: No  . Sexual activity: Not Asked   Other Topics Concern  . None   Social History Narrative   He and his partner [name] have now officially gotten married on 05/26/2013.  [name] is also a patient of our clinic.  He is now initially hyphenated his last name.   He works at  [employer].   He does not smoke, he quit in 2009.  He does not drink alcohol.   He was adopted and no family history is known.     Review of Systems  Respiratory:       See hpi  Cardiovascular: Negative for chest pain and palpitations.  Gastrointestinal: Negative for abdominal pain, diarrhea, nausea and vomiting.  Skin: Negative for itching and rash.  Neurological: Negative for dizziness and headaches.   See hpi  Objective: Vitals:   07/13/16 1502  BP: 134/63  Pulse: 72  Resp: 16  Temp: 98.5 F (36.9 C)  TempSrc: Oral  SpO2: 95%  Weight: (!) 329 lb 4 oz (149.3 kg)  Height: _0  (1.854 m)    Physical Exam General: alert, oriented, in NAD Head: normocephalic, atraumatic, no sinus tenderness Eyes: EOM intact, no scleral icterus or conjunctival injection Ears: TM clear bilaterally Nose: mucosa nonerythematous, nonedematous Throat: no pharyngeal exudate or erythema Lymph: no posterior auricular, submental or cervical lymph adenopathy Heart: normal rate, normal sinus rhythm, no murmurs Lungs: clear to auscultation bilaterally, no wheezing    Lab Results  Component Value Date   WBC 7.8 07/13/2016   HGB 13.9 (A) 07/13/2016   HCT 40.3 (A) 07/13/2016   MCV 89.2 07/13/2016   PLT 161 08/31/2015   Lab Results  Component Value Date   HGBA1C 9.9 (H) 08/31/2015     Assessment and Plan Ajeet was seen today for cough.  Diagnoses and all orders for this visit:  Diabetes mellitus type 2 with neurological manifestations ([place])- since pt has uncontrolled diabetes will monitor closely Discussed that diabetes can make healing complicated   -     POCT CBC -     POCT glucose (manual entry)  Acute URI- likely viral but since pt is a diabetic gave zpak to keep at home to take for progression to bronchitis No leukocytosis  Glucose within nl range Discussed how infection can affect blood glucose -     POCT CBC -     POCT glucose (manual entry) -     azithromycin  (ZITHROMAX) 250 MG tablet; Take 2 tabs by mouth day 1, then one tab each day after  A  total of 30  minutes were spent face-to-face with the patient during this encounter and over half of that time was spent on counseling and coordination of care.  [place]

## 2016-07-19 ENCOUNTER — Ambulatory Visit: Payer: Self-pay | Admitting: *Deleted

## 2016-07-19 VITALS — BP 115/73 | HR 61 | Wt 326.0 lb

## 2016-07-19 DIAGNOSIS — E118 Type 2 diabetes mellitus with unspecified complications: Secondary | ICD-10-CM

## 2016-07-19 DIAGNOSIS — I10 Essential (primary) hypertension: Secondary | ICD-10-CM

## 2016-07-19 DIAGNOSIS — Z794 Long term (current) use of insulin: Secondary | ICD-10-CM

## 2016-07-19 NOTE — Progress Notes (Signed)
Pt reports recent URI and sinus infection. Was on Amoxicillin last week, now started on Doxycycline earlier this week. Reports no appetite, so eating only enough to account for insulin injections. Reports his cbgs have still been running high despite this. Not drinking much water. Only sugar free ginger ale and Coke.  Saw ortho last week for hip pain. Told it was r/t back issues. Following up with second opinion at a back surgeon, referral pending.

## 2016-07-22 ENCOUNTER — Ambulatory Visit: Payer: Self-pay | Admitting: *Deleted

## 2016-07-22 VITALS — BP 142/83

## 2016-07-22 DIAGNOSIS — I10 Essential (primary) hypertension: Secondary | ICD-10-CM

## 2016-07-22 DIAGNOSIS — Z794 Long term (current) use of insulin: Principal | ICD-10-CM

## 2016-07-22 DIAGNOSIS — E118 Type 2 diabetes mellitus with unspecified complications: Secondary | ICD-10-CM

## 2016-07-22 LAB — GLUCOSE, POCT (MANUAL RESULT ENTRY): POC Glucose: 511 mg/dl — AB (ref 70–99)

## 2016-07-22 NOTE — Progress Notes (Signed)
Pt has been sick over last week. Only eating once daily due to decreased appetite. Taking insulin only when eating, so usually only once daily, as opposed to normally at least 3 times daily. Not checking cbgs at home. Elevated here. Asymptomatic. Going to go back to desk and take insulin. Will begin taking and checking more regularly this week and f/u with RN Friday.

## 2016-07-26 ENCOUNTER — Ambulatory Visit: Payer: Self-pay | Admitting: *Deleted

## 2016-07-26 VITALS — BP 130/79 | HR 63

## 2016-07-26 DIAGNOSIS — I10 Essential (primary) hypertension: Secondary | ICD-10-CM

## 2016-07-29 ENCOUNTER — Ambulatory Visit: Payer: Self-pay | Admitting: *Deleted

## 2016-07-29 VITALS — BP 157/84 | HR 61 | Wt 326.0 lb

## 2016-07-29 DIAGNOSIS — E118 Type 2 diabetes mellitus with unspecified complications: Secondary | ICD-10-CM

## 2016-07-29 DIAGNOSIS — Z0130 Encounter for examination of blood pressure without abnormal findings: Secondary | ICD-10-CM

## 2016-07-29 DIAGNOSIS — I10 Essential (primary) hypertension: Secondary | ICD-10-CM

## 2016-07-29 DIAGNOSIS — Z794 Long term (current) use of insulin: Secondary | ICD-10-CM

## 2016-07-29 NOTE — Progress Notes (Signed)
Pt still having nasal congestion/drainage, improving though. Many f/u appts upcoming. Cardiology and ortho. New L arm pain, most likely ortho related. Worse with certain movements, repetitive motion.

## 2016-08-01 ENCOUNTER — Other Ambulatory Visit: Payer: Self-pay | Admitting: Cardiology

## 2016-08-01 NOTE — Telephone Encounter (Signed)
We don't do Brilinta or Plavix refills

## 2016-08-02 ENCOUNTER — Ambulatory Visit: Payer: Self-pay | Admitting: *Deleted

## 2016-08-02 VITALS — BP 130/76 | HR 68

## 2016-08-02 DIAGNOSIS — Z794 Long term (current) use of insulin: Secondary | ICD-10-CM

## 2016-08-02 DIAGNOSIS — I10 Essential (primary) hypertension: Secondary | ICD-10-CM

## 2016-08-02 DIAGNOSIS — E118 Type 2 diabetes mellitus with unspecified complications: Secondary | ICD-10-CM

## 2016-08-02 NOTE — Progress Notes (Signed)
Weekly BP check. Log of BPs since February given to pt at request. Plans to take to upcoming MD appt. Back pain continues, unchanged from last visit. Ortho next week.

## 2016-08-05 ENCOUNTER — Ambulatory Visit: Payer: Self-pay | Admitting: *Deleted

## 2016-08-05 VITALS — BP 119/74 | HR 58 | Wt 326.0 lb

## 2016-08-05 DIAGNOSIS — I10 Essential (primary) hypertension: Secondary | ICD-10-CM

## 2016-08-05 DIAGNOSIS — Z794 Long term (current) use of insulin: Secondary | ICD-10-CM

## 2016-08-05 DIAGNOSIS — E118 Type 2 diabetes mellitus with unspecified complications: Secondary | ICD-10-CM

## 2016-08-08 ENCOUNTER — Encounter: Payer: Self-pay | Admitting: Cardiology

## 2016-08-08 ENCOUNTER — Ambulatory Visit (INDEPENDENT_AMBULATORY_CARE_PROVIDER_SITE_OTHER): Payer: No Typology Code available for payment source | Admitting: Cardiology

## 2016-08-08 VITALS — BP 108/62 | HR 60 | Ht 73.0 in | Wt 329.0 lb

## 2016-08-08 DIAGNOSIS — E782 Mixed hyperlipidemia: Secondary | ICD-10-CM

## 2016-08-08 DIAGNOSIS — I10 Essential (primary) hypertension: Secondary | ICD-10-CM

## 2016-08-08 DIAGNOSIS — I272 Pulmonary hypertension, unspecified: Secondary | ICD-10-CM | POA: Diagnosis not present

## 2016-08-08 DIAGNOSIS — I2510 Atherosclerotic heart disease of native coronary artery without angina pectoris: Secondary | ICD-10-CM

## 2016-08-08 DIAGNOSIS — I878 Other specified disorders of veins: Secondary | ICD-10-CM | POA: Diagnosis not present

## 2016-08-08 DIAGNOSIS — E1169 Type 2 diabetes mellitus with other specified complication: Secondary | ICD-10-CM

## 2016-08-08 DIAGNOSIS — I5032 Chronic diastolic (congestive) heart failure: Secondary | ICD-10-CM

## 2016-08-08 DIAGNOSIS — Z9861 Coronary angioplasty status: Secondary | ICD-10-CM | POA: Diagnosis not present

## 2016-08-08 NOTE — Patient Instructions (Signed)
NO CHANGE IN CURRENT MEDICATIONS     Your physician wants you to follow-up in 6 month with DR HARDING.You will receive a reminder letter in the mail two months in advance. If you don't receive a letter, please call our office to schedule the follow-up appointment.

## 2016-08-08 NOTE — Progress Notes (Signed)
PCP: Deland Pretty, MD Nephrology, Dr. Jimmy Footman, Pulmonology: Dr. Melvyn Novas  Clinic Note: Chief Complaint  Patient presents with  . Follow-up    cad    HPI: Harold [name] is a 61 y.o. male with a PMH below who presents today for 4 month f/u CAD - status post multivessel PCI. He also has difficult to manage dyslipidemia, morbid obesity, difficult to control DM-2 & chronic venous stasis (now better controlled)  Harold [name] was last seen Mar 29, 2016. - doing very well from a CV standpoint.  Edema well controlled.  Recent Hospitalizations: n/a  Studies Personally Reviewed - (if available, images/films reviewed: From Epic Chart or Care Everywhere)  No new studies  Interval History: Harold [name] presents today again in very good spirits with no active CV complaints.  As usual, he is "getting ready to start a program to get his weight under control".  He has seen another spine surgeon to discuss his persistent LBP -- the plan for now is to begin monitored pool PT.  He still has yet to loose weight. Very limited mobility, but is happy to note well controlled edema - really minimal at this point on once daily lasix (rarely uses additional doses). He is now back on a statin along with fenofibrate for HLD - no labs since last summer available.  For the amount of activity that he is capable of doing, he denies chest pain with rest or exertion. Mostlyu obesity related DOE. Still uses CPAP - no PND (will have "orthopnea due to obesity" without CPAP). No palpitations, lightheadedness, dizziness, weakness or syncope/near syncope. No TIA/amaurosis fugax symptoms. No melena, hematochezia, hematuria, or epstaxis. No claudication.  ROS: A comprehensive was performed. Review of Systems  Constitutional: Negative for malaise/fatigue and weight loss.  HENT: Negative for congestion and nosebleeds.        Getting over seasonal allergies  Respiratory: Positive for shortness of breath (baseline).  Negative for cough.   Cardiovascular: Negative for leg swelling (well controlled).  Gastrointestinal: Negative for constipation and heartburn.  Musculoskeletal: Positive for back pain (with radiculopathy) and joint pain.  Neurological: Negative for dizziness.  Endo/Heme/Allergies: Positive for environmental allergies.  Psychiatric/Behavioral: Negative for depression and memory loss. The patient is not nervous/anxious.   All other systems reviewed and are negative.   I have reviewed and (if needed) personally updated the patient's problem list, medications, allergies, past medical and surgical history, social and family history.   Past Medical History:  Diagnosis Date  . Anginal pain [facility]) March 2015   Cardiac cath showed patent stents with distal LAD, circumflex-OM and RCA disease in small vessels.  . Anxiety   . CAD S/P percutaneous coronary angioplasty 2003, 04/2012   status post PCI to LAD, circumflex-OM 2, RCA  . Carotid artery occlusion   . Chronic renal insufficiency, stage II (mild)   . Chronic venous insufficiency    varicosities, no reflux; dopplers 04/14/12- valvular insufficiency in the R and L GSV  . Complication of anesthesia   . COPD, mild ([place])   . Depression    situaltional   . Diabetes mellitus   . Dyslipidemia associated with type 2 diabetes mellitus ([place])   . GERD (gastroesophageal reflux disease)   . HTN (hypertension)   . Hypothyroidism   . Neuromuscular disorder (HCC)    neuropathy in feet  . Neuropathy    notably improved following PCI with improved cardiac function  . Obesity   . Obesity, Class II, BMI 35.0-39.9, with comorbidity (  see actual BMI)    BMI 39; wgt loss efforts in place; seeing Dietician  . PONV (postoperative nausea and vomiting)    "[place]"  . Pulmonary hypertension ([place]) 04/2012   PA pressure 53mhg  . Sleep apnea    uses nightly    Past Surgical History:  Procedure Laterality Date  . ABDOMINAL AORTAGRAM N/A 11/15/2011    Procedure: ABDOMINAL AMaxcine Ham  Surgeon: CElam Dutch MD;  [facility];  Service: Cardiovascular;  [name];  . ABIs  04/27/2012   mild bilateral arterial insufficiency  . BACK SURGERY  2005 x1   X2-2010  . BUNIONECTOMY Right 11/11/2013   Procedure: RIGHT FOOT SILVER BUNIONECTOMY;  Surgeon: JWylene Simmer MD;  [facility]  Service: Orthopedics;  Laterality: Right;  . CARDIAC CATHETERIZATION  12/11/2001   significant 3V CAD, normal LV function  . Carotid Doppler  04/27/2012   right internal carotid: Elevated velocities but no evidence of plaque. Left internal carotid 40-59%  . CAROTID ENDARTERECTOMY  2005   Right; recent carotid Dopplers notes elevated velocities.   . colonscopy    . CORONARY ANGIOPLASTY  12/21/2001   PTCA of the distal and mid AV groove circ, unsuccessful PTCA of second OM total occlusion, unsuccessful PTCA of the apical LAD total occlusion  . CORONARY ANGIOPLASTY WITH STENT PLACEMENT  04/29/12   PCI to 3 RCA lesions, Promus Premiere 2.274m8mm distally, mid was 2.56m43mmm and proximally 2.75x16m20mF 55-60%  . CORONARY STENT PLACEMENT  04/28/12   PCI to LAD (3x23mm73mnce DES postdilated to 3.25) and circ prox and mid (2 overlappinmg 2.56mmx174mXience DES postdilated to 2.756mm) 24mOPPLER ECHOCARDIOGRAPHY  04/28/2012   poor quality study: EF estimated 60-65%; unable to assess diastolic function (previously noted to have diastolic dysfunction); severely dilated left atrium and mild right atrium; dilated IVC consistent with increased central venous pressure..  . HERNIA REPAIR    . LEFT AND RIGHT HEART CATHETERIZATION WITH CORONARY ANGIOGRAM N/A 04/27/2012   Procedure: LEFT AND RIGHT HEART CATHETERIZATION WITH CORONARY ANGIOGRAM;  Surgeon: David WLeonie ManLocation[facility];  Service: Cardiovascular;  [name];  . LEFT AND RIGHT HEART CATHETERIZATION WITH CORONARY ANGIOGRAM N/A 05/14/2013   Procedure: LEFT AND RIGHT HEART  CATHETERIZATION WITH CORONARY ANGIOGRAM;  Surgeon: Thomas Troy SineLocation: [facility]: Moderate Pulm HTN: 46/16 - mean 33 mmHg; PCWP 22mmHg;36mltivessel CAD with widely patent mid LAD stents and 90% apical LAD, [name], Patent RCA ostial mid and distal stents - 70% distal runoff Dz  . LUMBAR LAMINECTOMY/DECOMPRESSION MICRODISCECTOMY  01/07/2012   Procedure: LUMBAR LAMINECTOMY/DECOMPRESSION MICRODISCECTOMY 1 LEVEL;  Surgeon: Kyle L CWinfield Cunasocation: [facility] NEURO ORS;  Service: Neurosurgery;  Laterality: Bilateral;   Lumbar Three-Four Decompression  . PERCUTANEOUS CORONARY STENT INTERVENTION (PCI-S) N/A 04/28/2012   Procedure: PERCUTANEOUS CORONARY STENT INTERVENTION (PCI-S);  Surgeon: Thomas ATroy Sineocation[facility];  Service: Cardiovascular;  [name];  . PERCUTANEOUS CORONARY STENT INTERVENTION (PCI-S) N/A 04/29/2012   Procedure: PERCUTANEOUS CORONARY STENT INTERVENTION (PCI-S);  Surgeon: David W Leonie Manocation: [facility];  Service: Cardiovascular;  [name];  . SPINE SURGERY    . UMBILICAL HERNIA REPAIR  2009   steel mesh insert    Current Meds  Medication Sig  . BRILINTA 90 MG TABS tablet TAKE 1 TABLET BY MOUTH 2 TIMES DAILY. KEEP OFFFICE VISIT.  . canaglMarland Kitchenflozin (IN[facility]ANA[facility]  100 MG TABS tablet Take 100 mg by mouth daily before breakfast.  . carvedilol (COREG) 6.25 MG tablet TAKE 1 TABLET (6.25 MG TOTAL) BY MOUTH 2 (TWO) TIMES DAILY.  Marland Kitchen Choline Fenofibrate (FENOFIBRIC ACID) 135 MG CPDR Take 1 capsule by mouth daily.  Marland Kitchen escitalopram (LEXAPRO) 20 MG tablet TAKE 1 TABLET (20 MG TOTAL) BY MOUTH DAILY.  Marland Kitchen esomeprazole (NEXIUM) 40 MG capsule Take 40 mg by mouth daily at 12 noon.  . furosemide (LASIX) 80 MG tablet Take 80 mg by mouth daily.  . insulin aspart (NOVOLOG) 100 UNIT/ML injection Inject 34 Units into the skin 3 (three) times daily with meals. IN ADDITION 25 MG ONCE ADAY prn  . Insulin Detemir (LEVEMIR) 100 UNIT/ML  Pen Inject 60 Units into the skin 2 (two) times daily. 60 units twice a day  . l-methylfolate-B6-B12 (METANX) 3-35-2 MG TABS tablet Take 1 tablet by mouth 2 (two) times daily.  Marland Kitchen levothyroxine (SYNTHROID, LEVOTHROID) 88 MCG tablet Take 88 mcg by mouth daily before breakfast.  . NITROSTAT 0.4 MG SL tablet PLACE 1 TAB UNDER THE TONGUE EVERY 5 MINUTES AS NEEDED FOR CHEST PAIN (SEVERE PRESSURE OR TIGHTNESS)  . telmisartan (MICARDIS) 80 MG tablet Take 80 mg by mouth daily.  . TRULICITY 1.5 HF/2.9MS SOPN Inject 1.5 mg into the skin once a week.    Allergies  Allergen Reactions  . Septra [Bactrim] Itching  . Penicillins Hives    Allergy to All cillin drugs  Ancef given 9/24 with no obvious reaction  . Clopidogrel     NON RESPONDER  P2Y12  . Metformin And Related   . Other     Walnuts, pecans     Social History   Social History  . Marital status: Single    Spouse name: N/A  . Number of children: 0  . Years of education: hs   Occupational History  . [employer]   .  [employer]   Social History Main Topics  . Smoking status: Former Smoker    Packs/day: 1.00    Years: 42.00    Types: Cigarettes    Quit date: 03/23/2003  . Smokeless tobacco: Never Used  . Alcohol use No  . Drug use: No  . Sexual activity: Not Asked   Other Topics Concern  . None   Social History Narrative   He and his partner Harold [name] have now officially gotten married on 05/26/2013.  Harold [name] is also a patient of our clinic.  He is now initially hyphenated his last name.   He works at [employer].   He does not smoke, he quit in 2009.  He does not drink alcohol.   He was adopted and no family history is known.     He was adopted. Family history is unknown by patient.  Wt Readings from Last 3 Encounters:  08/08/16 (!) 329 lb (149.2 kg)  08/05/16 (!) 326 lb (147.9 kg)  07/29/16 (!) 326 lb (147.9 kg)    PHYSICAL EXAM BP 108/62   Pulse 60   Ht _0  (1.854 m)   Wt (!) 329 lb  (149.2 kg)   BMI 43.41 kg/m  General appearance: alert, cooperative, appears stated age, no distress. Morbidly obese (no change in weight) HEENT: [place]/AT, EOMI, MMM, anicteric sclera Neck: no adenopathy, no carotid bruit and no JVD Lungs: clear to auscultation bilaterally, normal percussion bilaterally and non-labored Heart: regular rate and rhythm, S1 &S2 normal, no murmur, click, rub or gallop; unable to palpate PMI Abdomen:  soft, non-tender; bowel sounds normal; no masses; unable to palpate HSM or HJR Extremities: extremities normal, atraumatic, no cyanosis, and edema - trivial Pulses: 2+ and symmetric;  Skin: mobility and turgor normal, no evidence of bleeding or bruising, no lesions noted and faint venous stasis dermatitis - no lesions  Neurologic: Mental status: Alert & oriented x 3, thought content appropriate; non-focal exam.  Pleasant mood & affect.   Adult ECG Report  Rate: 60 ;  Rhythm: normal sinus rhythm and non-specific ST-T wave changes;   Narrative Interpretation: stable EKG  Other studies Reviewed: Additional studies/ records that were reviewed today include:  Recent Labs:  Being monitored by PCP Lab Results  Component Value Date   CHOL 291 (H) 08/31/2015   HDL 46 08/31/2015   LDLCALC 177 (H) 08/31/2015   TRIG 340 (H) 08/31/2015   CHOLHDL 6.3 (H) 08/31/2015    ASSESSMENT / PLAN: Overall stable - no medication changes.  Problem List Items Addressed This Visit    CAD, LAD/CFX DES 04/28/12- staged RCA DES 04/29/12 after pt declined CABG, cath 05/14/13 stable CAD medical therapy - Primary (Chronic)    Well controlled - no active angina since PCI. Last Cath in March 2015 - patent stents with distal LAD & RCA disease - not PCI amenale. No Angina. Continue Brilinta alone w/o ASA On stable dose BB & ARB. Back on statin.      Relevant Medications   rosuvastatin (CRESTOR) 10 MG tablet   Other Relevant Orders   EKG 12-Lead (Completed)   Chronic diastolic heart failure,  NYHA class 2 - PCWPf 22 mmHg during catheterization. (Chronic)    Essentially stable - no notable CHF Sx.  Edema well controlled. DOE related to obesity ([facility]). On stable dose of once daily Lasix. On ARB & BB @ stable dose - with current BP, no room to titrate.      Relevant Medications   rosuvastatin (CRESTOR) 10 MG tablet   Other Relevant Orders   EKG 12-Lead (Completed)   Combined hyperlipidemia associated with type 2 diabetes mellitus and obesity (Chronic)    Now back on statin & fibrate - being managed by PCP pharmacy team. No recent labs available - still need to think about PCSK9 Inhibitor if not @ goal.       Relevant Medications   rosuvastatin (CRESTOR) 10 MG tablet   Essential hypertension (Chronic)    Well controlled on BB & ARB.      Relevant Medications   rosuvastatin (CRESTOR) 10 MG tablet   Other Relevant Orders   EKG 12-Lead (Completed)   Obesity, Class III, BMI 40-49.9 (morbid obesity) (HCC) (Chronic)    Big issue -- hopefully if he will follow-through with pool exercises & will continue after "PT" sessions, maybe he wil finally be able to drop weight..      Pulmonary hypertension, 35mhg (Chronic)    Related mostly to OHS/OSA & COPD with some Diastolic Dysfunction component - Rx with BP control /afterload reduction & diuretic. - stable.      Relevant Medications   rosuvastatin (CRESTOR) 10 MG tablet   Venous stasis of both lower extremities - with edema (Chronic)    Looks great - no more lesions & edema well controlled.  Still uses support stockings (just not wearing today). Statis dermatitis is almost fully faded.      Relevant Medications   rosuvastatin (CRESTOR) 10 MG tablet      Current medicines are reviewed at length with the patient today. (+/- concerns)  n/a The following changes have been made: n/a  Patient Instructions  NO CHANGE IN CURRENT MEDICATIONS     Your physician wants you to follow-up in 6 month with DR HARDING.You will  receive a reminder letter in the mail two months in advance. If you don't receive a letter, please call our office to schedule the follow-up appointment.    Studies Ordered:   Orders Placed This Encounter  Procedures  . EKG 12-Lead      Glenetta Hew, M.D., M.S. Interventional Cardiologist   Pager # [phone] Phone # [phone] [address] [place], [place] [address]

## 2016-08-10 ENCOUNTER — Encounter: Payer: Self-pay | Admitting: Cardiology

## 2016-08-10 NOTE — Assessment & Plan Note (Signed)
Related mostly to OHS/OSA & COPD with some Diastolic Dysfunction component - Rx with BP control /afterload reduction & diuretic. - stable.

## 2016-08-10 NOTE — Assessment & Plan Note (Signed)
Big issue -- hopefully if he will follow-through with pool exercises & will continue after "PT" sessions, maybe he wil finally be able to drop weight.Marland Kitchen

## 2016-08-10 NOTE — Assessment & Plan Note (Signed)
Essentially stable - no notable CHF Sx.  Edema well controlled. DOE related to obesity ([facility]). On stable dose of once daily Lasix. On ARB & BB @ stable dose - with current BP, no room to titrate.

## 2016-08-10 NOTE — Assessment & Plan Note (Addendum)
Well controlled - no active angina since PCI. Last Cath in March 2015 - patent stents with distal LAD & RCA disease - not PCI amenale. No Angina. Continue Brilinta alone w/o ASA On stable dose BB & ARB. Back on statin.

## 2016-08-10 NOTE — Assessment & Plan Note (Signed)
Looks great - no more lesions & edema well controlled.  Still uses support stockings (just not wearing today). Statis dermatitis is almost fully faded.

## 2016-08-10 NOTE — Assessment & Plan Note (Signed)
Now back on statin & fibrate - being managed by PCP pharmacy team. No recent labs available - still need to think about PCSK9 Inhibitor if not @ goal.

## 2016-08-10 NOTE — Assessment & Plan Note (Signed)
Well controlled on BB & ARB.

## 2016-08-12 ENCOUNTER — Ambulatory Visit: Payer: Self-pay | Admitting: *Deleted

## 2016-08-12 VITALS — BP 130/76 | HR 57 | Wt 329.0 lb

## 2016-08-12 DIAGNOSIS — E118 Type 2 diabetes mellitus with unspecified complications: Secondary | ICD-10-CM

## 2016-08-12 DIAGNOSIS — Z794 Long term (current) use of insulin: Secondary | ICD-10-CM

## 2016-08-12 DIAGNOSIS — I10 Essential (primary) hypertension: Secondary | ICD-10-CM

## 2016-08-12 NOTE — Progress Notes (Signed)
Weekly check in. No recent med changes. Saw Ortho regarding hips/back. No surgery recommended at this time. Referred for water therapy. Starting tomorrow. Consult scheduled next month regarding implantable stimulator for back pain. Taking medications as Rx'd. CBGs still running in upper 200's.

## 2016-08-20 ENCOUNTER — Ambulatory Visit (INDEPENDENT_AMBULATORY_CARE_PROVIDER_SITE_OTHER): Payer: PRIVATE HEALTH INSURANCE | Admitting: Neurology

## 2016-08-20 ENCOUNTER — Telehealth: Payer: Self-pay

## 2016-08-20 ENCOUNTER — Encounter: Payer: Self-pay | Admitting: Neurology

## 2016-08-20 VITALS — BP 140/71 | HR 61 | Ht 73.0 in | Wt 323.0 lb

## 2016-08-20 DIAGNOSIS — R0683 Snoring: Secondary | ICD-10-CM

## 2016-08-20 DIAGNOSIS — E669 Obesity, unspecified: Secondary | ICD-10-CM | POA: Diagnosis not present

## 2016-08-20 DIAGNOSIS — G4733 Obstructive sleep apnea (adult) (pediatric): Secondary | ICD-10-CM | POA: Diagnosis not present

## 2016-08-20 NOTE — Telephone Encounter (Signed)
-----  Message from Patsy [place] sent at 08/20/2016 10:40 AM EDT ----- Vickie Epley,  I am sorry you were not made aware but he was not set up. He called on 05/20/16 and cancelled his appointment stating that he was going to wait on getting his replacement unit.   Thanks,  Angie  ----- Message ----- From: Lester [place], RN Sent: 08/20/2016   8:01 AM To: Patsy [place], Darleen Crocker, RN  Charm Rings,  I can't find this pt on airview or encoreanywhere. Did he get set up??
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
## 2016-08-20 NOTE — Telephone Encounter (Signed)
Please see below documentation from [facility].

## 2016-08-29 ENCOUNTER — Ambulatory Visit: Payer: Self-pay | Admitting: *Deleted

## 2016-08-29 VITALS — BP 113/72 | HR 61 | Wt 321.0 lb

## 2016-08-29 DIAGNOSIS — I10 Essential (primary) hypertension: Secondary | ICD-10-CM

## 2016-08-29 DIAGNOSIS — E118 Type 2 diabetes mellitus with unspecified complications: Secondary | ICD-10-CM

## 2016-08-29 DIAGNOSIS — Z794 Long term (current) use of insulin: Secondary | ICD-10-CM

## 2016-08-29 NOTE — Progress Notes (Signed)
Pt reports starting PT last week. Hips, legs feeling better, walking better. Movement still exacerbates pain, but not as much. In discussions with pain management regarding stimulator in back at some point.  L shoulder with Dx arthritis and received "shot" in it for pain and inflamed nerves per pt.  Blood sugars have been in 200's consistently last few weeks.

## 2016-09-03 ENCOUNTER — Ambulatory Visit: Payer: Self-pay | Admitting: *Deleted

## 2016-09-03 VITALS — BP 136/90 | HR 64

## 2016-09-03 DIAGNOSIS — Z794 Long term (current) use of insulin: Secondary | ICD-10-CM

## 2016-09-03 DIAGNOSIS — I10 Essential (primary) hypertension: Secondary | ICD-10-CM

## 2016-09-03 DIAGNOSIS — E118 Type 2 diabetes mellitus with unspecified complications: Secondary | ICD-10-CM

## 2016-09-03 NOTE — Progress Notes (Signed)
Weekly BP check.  Has been attending PT sessions x2 weeks now. Walking is easier, not as painful on back and hips. Seeing pain management later this week.   Has not been managing DM2 well. Not checking cbgs daily or before insulin shots. Taking meds most of the time.

## 2016-09-05 ENCOUNTER — Ambulatory Visit: Payer: Self-pay | Admitting: *Deleted

## 2016-09-05 VITALS — BP 139/76 | HR 60 | Ht 73.0 in | Wt 323.0 lb

## 2016-09-05 DIAGNOSIS — Z0000 Encounter for general adult medical examination without abnormal findings: Secondary | ICD-10-CM

## 2016-09-05 NOTE — Progress Notes (Signed)
[employer] insurance premium discount evaluation: Labs Drawn. [employer] ROI form signed. Tobacco Free Attestation form signed.  Forms placed in paper chart.  Results may be routed to PCP Dr. Shelia Media & Cardiology Dr. Ellyn Hack.

## 2016-09-06 LAB — CMP12+LP+TP+TSH+6AC+PSA+CBC…
A/G RATIO: 1.7 (ref 1.2–2.2)
ALT: 31 IU/L (ref 0–44)
AST: 25 IU/L (ref 0–40)
Albumin: 4.1 g/dL (ref 3.6–4.8)
Alkaline Phosphatase: 61 IU/L (ref 39–117)
BASOS: 1 %
BILIRUBIN TOTAL: 0.4 mg/dL (ref 0.0–1.2)
BUN/Creatinine Ratio: 18 (ref 10–24)
BUN: 28 mg/dL — AB (ref 8–27)
Basophils Absolute: 0 10*3/uL (ref 0.0–0.2)
CHOL/HDL RATIO: 4.4 ratio (ref 0.0–5.0)
CREATININE: 1.59 mg/dL — AB (ref 0.76–1.27)
Calcium: 9.7 mg/dL (ref 8.6–10.2)
Chloride: 99 mmol/L (ref 96–106)
Cholesterol, Total: 191 mg/dL (ref 100–199)
EOS (ABSOLUTE): 0.1 10*3/uL (ref 0.0–0.4)
Eos: 2 %
Estimated CHD Risk: 0.9 times avg. (ref 0.0–1.0)
Free Thyroxine Index: 1.8 (ref 1.2–4.9)
GFR, EST AFRICAN AMERICAN: 53 mL/min/{1.73_m2} — AB (ref >59)
GFR, EST NON AFRICAN AMERICAN: 46 mL/min/{1.73_m2} — AB (ref >59)
GGT: 29 IU/L (ref 0–65)
GLUCOSE: 233 mg/dL — AB (ref 65–99)
Globulin, Total: 2.4 g/dL (ref 1.5–4.5)
HDL: 43 mg/dL (ref >39)
Hematocrit: 42.5 % (ref 37.5–51.0)
Hemoglobin: 15.1 g/dL (ref 13.0–17.7)
IMMATURE GRANS (ABS): 0 10*3/uL (ref 0.0–0.1)
Immature Granulocytes: 0 %
Iron: 87 ug/dL (ref 38–169)
LDH: 115 IU/L — AB (ref 121–224)
LDL CALC: 90 mg/dL (ref 0–99)
Lymphocytes Absolute: 2 10*3/uL (ref 0.7–3.1)
Lymphs: 38 %
MCH: 31.8 pg (ref 26.6–33.0)
MCHC: 35.5 g/dL (ref 31.5–35.7)
MCV: 90 fL (ref 79–97)
MONOCYTES: 5 %
Monocytes Absolute: 0.3 10*3/uL (ref 0.1–0.9)
Neutrophils Absolute: 2.9 10*3/uL (ref 1.4–7.0)
Neutrophils: 54 %
POTASSIUM: 4.2 mmol/L (ref 3.5–5.2)
Phosphorus: 3.6 mg/dL (ref 2.5–4.5)
Platelets: 173 10*3/uL (ref 150–379)
Prostate Specific Ag, Serum: 0.4 ng/mL (ref 0.0–4.0)
RBC: 4.75 x10E6/uL (ref 4.14–5.80)
RDW: 15.1 % (ref 12.3–15.4)
Sodium: 140 mmol/L (ref 134–144)
T3 Uptake Ratio: 26 % (ref 24–39)
T4, Total: 7.1 ug/dL (ref 4.5–12.0)
TSH: 2.05 u[IU]/mL (ref 0.450–4.500)
Total Protein: 6.5 g/dL (ref 6.0–8.5)
Triglycerides: 291 mg/dL — ABNORMAL HIGH (ref 0–149)
Uric Acid: 6.2 mg/dL (ref 3.7–8.6)
VLDL CHOLESTEROL CAL: 58 mg/dL — AB (ref 5–40)
WBC: 5.3 10*3/uL (ref 3.4–10.8)

## 2016-09-06 LAB — HGB A1C W/O EAG: Hgb A1c MFr Bld: 10.7 % — ABNORMAL HIGH (ref 4.8–5.6)

## 2016-09-10 NOTE — Progress Notes (Signed)
Results reviewed with pt. A1c worsened. Pt endorses poor diet and medication regimen maintenance. Avoid dehydration 2/2 worsening kidney function. Reports he saw nephrologist last week and they were not concerned with his recent numbers. LDL improved from 177 previous per paper chart (2017) and total chol down from 291. Doing pool PT for past 3 weeks. Feels this is improving his back/hip pain, but is still being referred for possible back stimulator. Other exercise only includes working in the yard 1-2 days a week. Results routed to PCP [facility] [employer] with instructions to contact pt with any concerns.

## 2016-09-16 ENCOUNTER — Ambulatory Visit: Payer: Self-pay | Admitting: *Deleted

## 2016-09-16 VITALS — BP 135/80 | HR 72 | Wt 324.0 lb

## 2016-09-16 DIAGNOSIS — I10 Essential (primary) hypertension: Secondary | ICD-10-CM

## 2016-09-16 DIAGNOSIS — E118 Type 2 diabetes mellitus with unspecified complications: Secondary | ICD-10-CM

## 2016-09-16 DIAGNOSIS — Z794 Long term (current) use of insulin: Secondary | ICD-10-CM

## 2016-09-20 ENCOUNTER — Ambulatory Visit: Payer: Self-pay | Admitting: *Deleted

## 2016-09-20 VITALS — BP 118/72 | Wt 324.0 lb

## 2016-09-23 ENCOUNTER — Ambulatory Visit: Payer: Self-pay | Admitting: *Deleted

## 2016-09-23 VITALS — BP 160/84 | HR 64

## 2016-09-23 DIAGNOSIS — I10 Essential (primary) hypertension: Secondary | ICD-10-CM

## 2016-09-23 DIAGNOSIS — Z794 Long term (current) use of insulin: Secondary | ICD-10-CM

## 2016-09-23 DIAGNOSIS — E118 Type 2 diabetes mellitus with unspecified complications: Secondary | ICD-10-CM

## 2016-09-24 ENCOUNTER — Ambulatory Visit: Payer: Self-pay | Admitting: *Deleted

## 2016-09-24 VITALS — BP 136/81 | HR 67

## 2016-09-24 DIAGNOSIS — I10 Essential (primary) hypertension: Secondary | ICD-10-CM

## 2016-09-27 ENCOUNTER — Ambulatory Visit: Payer: Self-pay | Admitting: *Deleted

## 2016-09-27 VITALS — BP 114/69 | Wt 320.0 lb

## 2016-10-07 ENCOUNTER — Ambulatory Visit: Payer: Self-pay | Admitting: *Deleted

## 2016-10-07 VITALS — BP 123/82 | HR 67 | Wt 321.0 lb

## 2016-10-07 DIAGNOSIS — E118 Type 2 diabetes mellitus with unspecified complications: Secondary | ICD-10-CM

## 2016-10-07 DIAGNOSIS — I10 Essential (primary) hypertension: Secondary | ICD-10-CM

## 2016-10-07 DIAGNOSIS — Z794 Long term (current) use of insulin: Secondary | ICD-10-CM

## 2016-10-07 NOTE — Progress Notes (Signed)
Weekly BP & Wt check.  MRI results back-bulging disc C5-C6. Waiting for steroid shots as firstline treatment.  Cataract removal R eye last week. Some blurriness and difficulty with near sightedness. L being done 8/29.

## 2016-10-08 ENCOUNTER — Telehealth: Payer: Self-pay | Admitting: Neurology

## 2016-10-08 ENCOUNTER — Other Ambulatory Visit: Payer: Self-pay | Admitting: Neurology

## 2016-10-08 DIAGNOSIS — Z9989 Dependence on other enabling machines and devices: Principal | ICD-10-CM

## 2016-10-08 DIAGNOSIS — G4733 Obstructive sleep apnea (adult) (pediatric): Secondary | ICD-10-CM

## 2016-10-08 NOTE — Telephone Encounter (Signed)
Called and spoke with the patient. The patient is now established with [facility] and has a auto cpap capable machine. We will reach out to [facility] with new orders and set machine to auto cpap 5-15 cm of water. Pt verbalized understanding and had no further questions.

## 2016-10-08 NOTE — Telephone Encounter (Signed)
Patient called office in reference to his new CPAP machine about a month ago since he has gotten the machine he has noticed more snoring and more breathing thru his mouth.  Pressure setting is at 8 he is wanting to know if the pressure may be to low?  Please call

## 2016-10-10 ENCOUNTER — Telehealth: Payer: Self-pay | Admitting: *Deleted

## 2016-10-10 NOTE — Telephone Encounter (Signed)
Pt calling to inform that instead of the pressure being increased it was decreased down to 5, pt is asking to be called please

## 2016-10-10 NOTE — Telephone Encounter (Signed)
FAXED CLEARANCE -- OK FOR PATIENT TO COME OFF BRILINTA FOR 5 DAYS BEFORE  Cervical ESI SCHEDULE FOR 10/25/16---PER DR Ellyn Hack.

## 2016-10-10 NOTE — Telephone Encounter (Signed)
Called pt back and informed that it should be auto and range is now 5-15 cm water. Pt verbalized understanding and stated he would reach out to [facility] and make sure it is correct.

## 2016-10-11 ENCOUNTER — Ambulatory Visit: Payer: Self-pay | Admitting: *Deleted

## 2016-10-11 VITALS — BP 167/86 | HR 66

## 2016-10-11 DIAGNOSIS — E118 Type 2 diabetes mellitus with unspecified complications: Secondary | ICD-10-CM

## 2016-10-11 DIAGNOSIS — I10 Essential (primary) hypertension: Secondary | ICD-10-CM

## 2016-10-11 DIAGNOSIS — Z794 Long term (current) use of insulin: Secondary | ICD-10-CM

## 2016-10-11 NOTE — Progress Notes (Signed)
Pt reports PT going well, but making him sore and tired. Suspects this is why his BP is up today as he just recently got to work from PT.   R eye now clear post cataract surgery. Had some blurriness and gritty feeling that is resolved. L eye scheduled for next Wed.   Blood sugars mostly under 200. Trying to control them better over past few weeks.   Trigger finger in L hand returned. Appt with Dr. Amedeo Plenty Monday. Could do one more steroid shot but is also receiving steroid shot in neck Monday morning so he is expecting them to suggest surgery instead since last steroid inj did not last very long compared to first one.   Still awaiting pain psychology referral/appt in order to enter pain clinic management.

## 2016-10-18 ENCOUNTER — Ambulatory Visit: Payer: Self-pay | Admitting: *Deleted

## 2016-10-18 ENCOUNTER — Telehealth: Payer: Self-pay | Admitting: *Deleted

## 2016-10-18 VITALS — BP 136/84 | HR 64 | Wt 320.0 lb

## 2016-10-18 DIAGNOSIS — Z794 Long term (current) use of insulin: Secondary | ICD-10-CM

## 2016-10-18 DIAGNOSIS — E118 Type 2 diabetes mellitus with unspecified complications: Secondary | ICD-10-CM

## 2016-10-18 DIAGNOSIS — I10 Essential (primary) hypertension: Secondary | ICD-10-CM

## 2016-10-18 NOTE — Telephone Encounter (Signed)
FAXED ON 09/3016  CLEARANCE  FOR  LEFT RING FINGER:A1 Pulley release with tenosynovectomy  Per Dr Ellyn Hack--  Low risk procedure  for- moderate risk proceure--- patient is cleared for surgery , recommendation to hold Brilinta for 7 days pre -op, restart 2-3 days post

## 2016-10-18 NOTE — Progress Notes (Signed)
Weekly BP and Wt check. Continuing PT for back and hips. Feeling well recovered after bil cataract removals.

## 2016-10-22 ENCOUNTER — Ambulatory Visit: Payer: Self-pay | Admitting: *Deleted

## 2016-10-22 VITALS — BP 139/84 | HR 68

## 2016-10-22 DIAGNOSIS — E118 Type 2 diabetes mellitus with unspecified complications: Secondary | ICD-10-CM

## 2016-10-22 DIAGNOSIS — Z794 Long term (current) use of insulin: Secondary | ICD-10-CM

## 2016-10-22 DIAGNOSIS — I10 Essential (primary) hypertension: Secondary | ICD-10-CM

## 2016-10-22 NOTE — Progress Notes (Signed)
Weekly follow up. Having increased neck and back pain. Was unable to do PT today due to stomach issues. Plans to return Thursday.

## 2016-10-28 ENCOUNTER — Ambulatory Visit: Payer: Self-pay | Admitting: *Deleted

## 2016-10-28 VITALS — BP 137/84 | HR 65 | Wt 323.0 lb

## 2016-10-28 DIAGNOSIS — Z794 Long term (current) use of insulin: Secondary | ICD-10-CM

## 2016-10-28 DIAGNOSIS — E118 Type 2 diabetes mellitus with unspecified complications: Secondary | ICD-10-CM

## 2016-10-28 DIAGNOSIS — I10 Essential (primary) hypertension: Secondary | ICD-10-CM

## 2016-10-28 NOTE — Telephone Encounter (Signed)
Start auto CPAP at 8 cm water - 15 cm .

## 2016-10-28 NOTE — Progress Notes (Signed)
Weekly BP and Wt check.

## 2016-10-28 NOTE — Telephone Encounter (Signed)
Patient called office in reference to recent pressure set to auto 5-15, but patient would like the pressure range 8-15.  Patient states he is having an increase in mouth breathing and snoring.  [facility] advised patient they are able to do the 8-15 range would just need a new order sent to them.  Please call patient with any questions.

## 2016-10-28 NOTE — Addendum Note (Signed)
Addended by: Larey Seat on: 10/28/2016 10:24 AM   Modules accepted: Orders

## 2016-10-28 NOTE — Telephone Encounter (Signed)
Orders sent to [facility].

## 2016-11-04 ENCOUNTER — Ambulatory Visit: Payer: Self-pay | Admitting: *Deleted

## 2016-11-04 VITALS — BP 143/76 | HR 70 | Wt 327.0 lb

## 2016-11-04 DIAGNOSIS — I10 Essential (primary) hypertension: Secondary | ICD-10-CM

## 2016-11-04 DIAGNOSIS — E118 Type 2 diabetes mellitus with unspecified complications: Secondary | ICD-10-CM

## 2016-11-04 DIAGNOSIS — Z794 Long term (current) use of insulin: Secondary | ICD-10-CM

## 2016-11-04 NOTE — Progress Notes (Signed)
Eyes still doing well post cataract surgery.   Pending eval appt this week for bulging disc in mid back.   Waiting for insurance approval for spinal stimulator for back pain.   Weight up by 4# from last week. He reports this "must be water weight."

## 2016-11-11 ENCOUNTER — Ambulatory Visit: Payer: Self-pay | Admitting: *Deleted

## 2016-11-11 VITALS — BP 130/77 | HR 59 | Wt 325.0 lb

## 2016-11-11 DIAGNOSIS — E118 Type 2 diabetes mellitus with unspecified complications: Secondary | ICD-10-CM

## 2016-11-11 DIAGNOSIS — I10 Essential (primary) hypertension: Secondary | ICD-10-CM

## 2016-11-11 DIAGNOSIS — Z794 Long term (current) use of insulin: Secondary | ICD-10-CM

## 2016-11-11 NOTE — Progress Notes (Signed)
Weekly BP and Wt check.   2nd Neck steroid inj in 2 weeks. Improved after first inj, but hoping for further resolution after 2nd attempt.   Trigger finger surgery L hand scheduled at end of October.   Awaiting insurance approval for spinal stimulator.

## 2016-11-15 ENCOUNTER — Ambulatory Visit: Payer: Self-pay | Admitting: *Deleted

## 2016-11-15 VITALS — BP 115/72 | HR 63

## 2016-11-15 DIAGNOSIS — Z794 Long term (current) use of insulin: Secondary | ICD-10-CM

## 2016-11-15 DIAGNOSIS — E118 Type 2 diabetes mellitus with unspecified complications: Secondary | ICD-10-CM

## 2016-11-15 DIAGNOSIS — I10 Essential (primary) hypertension: Secondary | ICD-10-CM

## 2016-11-15 NOTE — Progress Notes (Signed)
Weekly BP check:   Pt reports 8.5 A1c from pcp office this week. Previously 10.7. Increased Lantus dose per Diabetes management at pcp 64 to 68units at night.

## 2016-11-18 ENCOUNTER — Ambulatory Visit: Payer: Self-pay | Admitting: *Deleted

## 2016-11-18 VITALS — BP 150/78 | HR 67

## 2016-11-18 DIAGNOSIS — I10 Essential (primary) hypertension: Secondary | ICD-10-CM

## 2016-11-18 DIAGNOSIS — E118 Type 2 diabetes mellitus with unspecified complications: Secondary | ICD-10-CM

## 2016-11-18 DIAGNOSIS — Z794 Long term (current) use of insulin: Secondary | ICD-10-CM

## 2016-11-22 ENCOUNTER — Ambulatory Visit: Payer: Self-pay | Admitting: *Deleted

## 2016-11-22 VITALS — BP 121/80 | HR 68

## 2016-11-22 DIAGNOSIS — I10 Essential (primary) hypertension: Secondary | ICD-10-CM

## 2016-11-22 DIAGNOSIS — Z794 Long term (current) use of insulin: Principal | ICD-10-CM

## 2016-11-22 DIAGNOSIS — E118 Type 2 diabetes mellitus with unspecified complications: Secondary | ICD-10-CM

## 2016-11-22 NOTE — Progress Notes (Signed)
Weekly BP check.

## 2016-11-25 ENCOUNTER — Ambulatory Visit: Payer: Self-pay | Admitting: *Deleted

## 2016-11-25 VITALS — BP 150/77 | HR 67 | Wt 333.0 lb

## 2016-11-25 DIAGNOSIS — E118 Type 2 diabetes mellitus with unspecified complications: Secondary | ICD-10-CM

## 2016-11-25 DIAGNOSIS — I10 Essential (primary) hypertension: Secondary | ICD-10-CM

## 2016-11-25 DIAGNOSIS — Z794 Long term (current) use of insulin: Secondary | ICD-10-CM

## 2016-11-25 NOTE — Progress Notes (Signed)
Weekly BP check.   Tentative surgery date set for temporary spinal stimulator in December. Steroid inj for neck this Friday scheduled.  C/o back pain r/t "overdoing it" this past weekend.

## 2016-12-03 ENCOUNTER — Ambulatory Visit: Payer: Self-pay | Admitting: *Deleted

## 2016-12-03 ENCOUNTER — Encounter: Payer: Self-pay | Admitting: Neurology

## 2016-12-03 VITALS — BP 113/72 | HR 70

## 2016-12-03 DIAGNOSIS — I10 Essential (primary) hypertension: Secondary | ICD-10-CM

## 2016-12-03 DIAGNOSIS — E118 Type 2 diabetes mellitus with unspecified complications: Secondary | ICD-10-CM

## 2016-12-03 DIAGNOSIS — Z794 Long term (current) use of insulin: Secondary | ICD-10-CM
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
## 2016-12-05 ENCOUNTER — Ambulatory Visit (INDEPENDENT_AMBULATORY_CARE_PROVIDER_SITE_OTHER): Payer: PRIVATE HEALTH INSURANCE | Admitting: Neurology

## 2016-12-05 ENCOUNTER — Encounter: Payer: Self-pay | Admitting: Neurology

## 2016-12-05 VITALS — BP 128/72 | HR 66 | Ht 73.0 in | Wt 320.0 lb

## 2016-12-05 DIAGNOSIS — G4733 Obstructive sleep apnea (adult) (pediatric): Secondary | ICD-10-CM | POA: Diagnosis not present

## 2016-12-05 DIAGNOSIS — Z9989 Dependence on other enabling machines and devices: Secondary | ICD-10-CM
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
## 2016-12-05 NOTE — Patient Instructions (Signed)
Patient uses [facility]-  DME. Needs new nasal mask.

## 2016-12-06 ENCOUNTER — Ambulatory Visit: Payer: Self-pay | Admitting: *Deleted

## 2016-12-06 VITALS — BP 143/84 | HR 76 | Wt 323.0 lb

## 2016-12-06 DIAGNOSIS — I10 Essential (primary) hypertension: Secondary | ICD-10-CM

## 2016-12-06 DIAGNOSIS — E118 Type 2 diabetes mellitus with unspecified complications: Secondary | ICD-10-CM

## 2016-12-06 DIAGNOSIS — Z794 Long term (current) use of insulin: Secondary | ICD-10-CM

## 2016-12-13 ENCOUNTER — Ambulatory Visit: Payer: Self-pay | Admitting: *Deleted

## 2016-12-13 VITALS — BP 151/81 | HR 69 | Wt 328.0 lb

## 2016-12-13 DIAGNOSIS — Z794 Long term (current) use of insulin: Secondary | ICD-10-CM

## 2016-12-13 DIAGNOSIS — I10 Essential (primary) hypertension: Secondary | ICD-10-CM

## 2016-12-13 DIAGNOSIS — E118 Type 2 diabetes mellitus with unspecified complications: Secondary | ICD-10-CM

## 2016-12-16 ENCOUNTER — Ambulatory Visit: Payer: Self-pay | Admitting: *Deleted

## 2016-12-16 VITALS — BP 137/79 | HR 62 | Wt 324.5 lb

## 2016-12-16 DIAGNOSIS — Z794 Long term (current) use of insulin: Secondary | ICD-10-CM

## 2016-12-16 DIAGNOSIS — I10 Essential (primary) hypertension: Secondary | ICD-10-CM

## 2016-12-16 DIAGNOSIS — E118 Type 2 diabetes mellitus with unspecified complications: Secondary | ICD-10-CM

## 2016-12-16 NOTE — Progress Notes (Signed)
Pt reports cbgs have been running in the 300's despite meal time short acting dosing and basal insulin. Scheduled to see DM2 coordinator next month. Encouraged him to discuss medication regimen with her since sugars have been uncontrolled in the 200-300's for at least 2-3 months now.  Pt also reports having stopped taking what he thinks is the "depressant pill because I don't need to be depressed anymore." Educated pt on the fact that his med list includes an anti-depressant to help manage depression, not make him depressed. However, pt could not pick out on med list which med it was that he stopped. He can look on pill bottle at home to see what he stopped taking. Asked him to email me which med it was as soon as he knows. Also asked him not to quit any of his medicines cold [place], or without speaking to his providers to avoid adverse outcomes. He is agreeable to this.

## 2016-12-20 ENCOUNTER — Ambulatory Visit: Payer: Self-pay | Admitting: *Deleted

## 2016-12-20 VITALS — BP 134/71 | HR 62

## 2016-12-20 DIAGNOSIS — E118 Type 2 diabetes mellitus with unspecified complications: Secondary | ICD-10-CM

## 2016-12-20 DIAGNOSIS — Z794 Long term (current) use of insulin: Principal | ICD-10-CM

## 2016-12-20 DIAGNOSIS — I10 Essential (primary) hypertension: Secondary | ICD-10-CM

## 2016-12-23 ENCOUNTER — Ambulatory Visit: Payer: Self-pay | Admitting: *Deleted

## 2016-12-23 VITALS — BP 138/76 | HR 62 | Wt 326.0 lb

## 2016-12-23 DIAGNOSIS — E118 Type 2 diabetes mellitus with unspecified complications: Secondary | ICD-10-CM

## 2016-12-23 DIAGNOSIS — I10 Essential (primary) hypertension: Secondary | ICD-10-CM

## 2016-12-23 DIAGNOSIS — Z794 Long term (current) use of insulin: Principal | ICD-10-CM

## 2016-12-23 NOTE — Progress Notes (Signed)
Per note on 12/16/16, pt stopped one of his meds believing it to be the anti depressant "because I don't need to be depressed anymore." Pt could not determine at home which med he stopped as the bottles were taken away in the trash after he filled his monthly pill container. After reconciling his med personal wallet med list to Epic med list, determined pt most likely stopped taking Levothyroxine. It has now been at least 2-3 weeks ago that he stopped it. Sts he feels well off of it, but will speak with his primary doctor, since they prescribed it last year, and see if they wanted him to restart it and will get a new prescription if they do. Also going to request new Omeprazole prescription from cardiology.

## 2016-12-27 ENCOUNTER — Telehealth: Payer: Self-pay | Admitting: Cardiology

## 2016-12-27 ENCOUNTER — Ambulatory Visit: Payer: Self-pay | Admitting: *Deleted

## 2016-12-27 VITALS — BP 126/72 | HR 59 | Wt 325.0 lb

## 2016-12-27 DIAGNOSIS — I10 Essential (primary) hypertension: Secondary | ICD-10-CM

## 2016-12-27 DIAGNOSIS — Z794 Long term (current) use of insulin: Principal | ICD-10-CM

## 2016-12-27 DIAGNOSIS — E118 Type 2 diabetes mellitus with unspecified complications: Secondary | ICD-10-CM

## 2016-12-27 NOTE — Telephone Encounter (Signed)
[name] is calling to get a prescription(Omeprazole 20 mg )  sent to the RN Cheri Guppy)  at his job [phone]. Can get his prescription fill at his Job free of Charge .  Please call if you have any questions

## 2016-12-27 NOTE — Progress Notes (Signed)
New Rx's that were requested are pending. Cardiology asked him to f/u with pcp for Omeprazole refill. When he hears back from pcp re: Levothyroxine that he had stopped, he will also ask for the Omeprazole Rx.

## 2016-12-27 NOTE — Telephone Encounter (Signed)
Left detailed message (ok per DPR) to refer to PCP for refill/RX.  Advised to call back with further questions or concerns.

## 2016-12-30 ENCOUNTER — Ambulatory Visit: Payer: Self-pay | Admitting: *Deleted

## 2016-12-30 VITALS — BP 130/78 | HR 59 | Wt 324.0 lb

## 2016-12-30 DIAGNOSIS — I10 Essential (primary) hypertension: Secondary | ICD-10-CM

## 2016-12-30 DIAGNOSIS — Z794 Long term (current) use of insulin: Secondary | ICD-10-CM

## 2016-12-30 DIAGNOSIS — E118 Type 2 diabetes mellitus with unspecified complications: Secondary | ICD-10-CM

## 2016-12-30 NOTE — Progress Notes (Signed)
Has requested a new prescription for Prilosec. If clinic has not received by tomorrow, will do a bridge refill for 30 days. Left message for MD office regarding Levothyroxine stopping/restarting. No response yet. Sees them Friday. Will take med list and discuss this then if no response.

## 2017-01-02 ENCOUNTER — Ambulatory Visit: Payer: Self-pay | Admitting: *Deleted

## 2017-01-02 VITALS — BP 149/78 | HR 60

## 2017-01-02 DIAGNOSIS — E118 Type 2 diabetes mellitus with unspecified complications: Secondary | ICD-10-CM

## 2017-01-02 DIAGNOSIS — Z794 Long term (current) use of insulin: Principal | ICD-10-CM

## 2017-01-02 DIAGNOSIS — I10 Essential (primary) hypertension: Secondary | ICD-10-CM

## 2017-01-06 ENCOUNTER — Ambulatory Visit: Payer: Self-pay | Admitting: *Deleted

## 2017-01-06 VITALS — BP 146/79 | HR 61

## 2017-01-06 DIAGNOSIS — Z794 Long term (current) use of insulin: Secondary | ICD-10-CM

## 2017-01-06 DIAGNOSIS — E118 Type 2 diabetes mellitus with unspecified complications: Secondary | ICD-10-CM

## 2017-01-06 DIAGNOSIS — I10 Essential (primary) hypertension: Secondary | ICD-10-CM

## 2017-01-06 NOTE — Progress Notes (Signed)
Pt reports seeing pcp last week. Got a renewed Rx for the Levothyroxine he had quit taking. Unsure if it is same dose as previous. Plans to bring Rx to clinic as soon as he arrives to work tomorrow for filling.

## 2017-01-14 ENCOUNTER — Ambulatory Visit: Payer: Self-pay | Admitting: *Deleted

## 2017-01-14 VITALS — BP 146/77 | HR 60 | Wt 324.0 lb

## 2017-01-14 DIAGNOSIS — I10 Essential (primary) hypertension: Secondary | ICD-10-CM

## 2017-01-14 DIAGNOSIS — Z794 Long term (current) use of insulin: Principal | ICD-10-CM

## 2017-01-14 DIAGNOSIS — E118 Type 2 diabetes mellitus with unspecified complications: Secondary | ICD-10-CM

## 2017-01-14 NOTE — Progress Notes (Signed)
Pt reports he has gotten in to the habit of taking his insulin shots without checking cbg. Warned pt of dangers of this. He acknowledges this. He reports frustration with the time and equipment it takes with checking cbgs 4x day. Discussed possible option of Freestyle Libre with pt at length. He is very interested in this and plans to move up his diabetes coordinator appt in order to discuss this with them sooner to see if he is a candidate for it.  Reports he has been taking Levothyroxine daily again since restarting rx.  No questions or concerns today.

## 2017-01-17 ENCOUNTER — Ambulatory Visit: Payer: Self-pay | Admitting: *Deleted

## 2017-01-17 VITALS — BP 136/77 | HR 65

## 2017-01-17 DIAGNOSIS — Z794 Long term (current) use of insulin: Secondary | ICD-10-CM

## 2017-01-17 DIAGNOSIS — E118 Type 2 diabetes mellitus with unspecified complications: Secondary | ICD-10-CM

## 2017-01-17 DIAGNOSIS — I10 Essential (primary) hypertension: Secondary | ICD-10-CM

## 2017-01-17 NOTE — Progress Notes (Signed)
Assisted pt with contacting [employer] for [facility] prescriptions retrieval service to request Rx from pcp for [place] system. He is now signed up for retrieval service and will receive email contact from [place] when Rx is available.

## 2017-01-21 ENCOUNTER — Ambulatory Visit: Payer: Self-pay | Admitting: *Deleted

## 2017-01-21 VITALS — BP 138/76 | HR 71

## 2017-01-21 DIAGNOSIS — E118 Type 2 diabetes mellitus with unspecified complications: Secondary | ICD-10-CM

## 2017-01-21 DIAGNOSIS — I10 Essential (primary) hypertension: Secondary | ICD-10-CM

## 2017-01-21 DIAGNOSIS — Z794 Long term (current) use of insulin: Secondary | ICD-10-CM

## 2017-01-21 NOTE — Progress Notes (Signed)
Pt reports he has stopped Brilinta in preparation for surgery on Friday. BP steadily rising over past few weeks. Suggested he may need to speak with PCP or cardiology soon regarding this. He sys he just hasn't felt well recently and feels this is related. Will likely wait until next reg visit at end of next month to speak to pcp. Asymptomatic of HTN sx.

## 2017-01-23 ENCOUNTER — Ambulatory Visit: Payer: Self-pay | Admitting: *Deleted

## 2017-01-23 VITALS — BP 152/74 | HR 71

## 2017-01-23 DIAGNOSIS — I10 Essential (primary) hypertension: Secondary | ICD-10-CM

## 2017-01-23 DIAGNOSIS — E118 Type 2 diabetes mellitus with unspecified complications: Secondary | ICD-10-CM

## 2017-01-23 DIAGNOSIS — Z794 Long term (current) use of insulin: Secondary | ICD-10-CM

## 2017-01-23 NOTE — Progress Notes (Signed)
Pt here for weekly check. Trial pain stimulator implant tomorrow morning. Then trigger finger surgery scheduled Saturday. Plans to return to work Monday weather permitting.  Advised again to speak with pcp or cardiologist soon regarding BP as it has been persistantly high.  Pt reports he has been doing better over past 2-3 weeks taking insulins and all meds. Only checking cbg 1-2 times a day though. Last check 2 days ago was mid 300's. No further response received regarding Freestyle Libre requested Rx, still in process per company.

## 2017-02-03 ENCOUNTER — Ambulatory Visit: Payer: Self-pay | Admitting: *Deleted

## 2017-02-03 VITALS — BP 156/82 | HR 62 | Wt 323.0 lb

## 2017-02-03 DIAGNOSIS — I10 Essential (primary) hypertension: Secondary | ICD-10-CM

## 2017-02-03 DIAGNOSIS — E118 Type 2 diabetes mellitus with unspecified complications: Secondary | ICD-10-CM

## 2017-02-03 DIAGNOSIS — Z794 Long term (current) use of insulin: Secondary | ICD-10-CM
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
## 2017-02-07 ENCOUNTER — Ambulatory Visit: Payer: Self-pay | Admitting: *Deleted

## 2017-02-07 VITALS — BP 134/74 | HR 68

## 2017-02-07 DIAGNOSIS — I10 Essential (primary) hypertension: Secondary | ICD-10-CM

## 2017-02-07 DIAGNOSIS — E118 Type 2 diabetes mellitus with unspecified complications: Secondary | ICD-10-CM

## 2017-02-07 DIAGNOSIS — Z794 Long term (current) use of insulin: Secondary | ICD-10-CM

## 2017-02-27 ENCOUNTER — Ambulatory Visit: Payer: Self-pay | Admitting: *Deleted

## 2017-02-27 VITALS — BP 126/79 | HR 68 | Wt 322.0 lb

## 2017-02-27 DIAGNOSIS — I10 Essential (primary) hypertension: Secondary | ICD-10-CM

## 2017-02-27 DIAGNOSIS — Z794 Long term (current) use of insulin: Secondary | ICD-10-CM

## 2017-02-27 DIAGNOSIS — E118 Type 2 diabetes mellitus with unspecified complications: Secondary | ICD-10-CM

## 2017-02-27 NOTE — Progress Notes (Signed)
Assisted pt with applying 14 day Libre glucose system and setting up meter to first patch. Applied to L upper arm. In 60 min warm up window currently. Will check cbg against patch tomorrow for accuracy.

## 2017-03-03 DIAGNOSIS — M6530 Trigger finger, unspecified finger: Secondary | ICD-10-CM | POA: Insufficient documentation

## 2017-03-03 DIAGNOSIS — M79645 Pain in left finger(s): Secondary | ICD-10-CM | POA: Insufficient documentation

## 2017-03-07 ENCOUNTER — Ambulatory Visit: Payer: Self-pay | Admitting: *Deleted

## 2017-03-07 VITALS — BP 138/68 | HR 67 | Wt 325.0 lb

## 2017-03-07 DIAGNOSIS — Z794 Long term (current) use of insulin: Secondary | ICD-10-CM

## 2017-03-07 DIAGNOSIS — I10 Essential (primary) hypertension: Secondary | ICD-10-CM

## 2017-03-07 DIAGNOSIS — E118 Type 2 diabetes mellitus with unspecified complications: Secondary | ICD-10-CM

## 2017-03-07 NOTE — Progress Notes (Signed)
Twice weekly BP check at pt's request. Reviewed log for [facility] with pt. Readings very variable. 120's-250's on average. A couple in 300's. One at 420. Pt reports eating "3 pan pizzas which equals basically one small pizza" that night for dinner.  Pt was proud that he is managing his blood sugar better, checking it more often and administering all injections since receiving libre system. RN reminded pt he could not just think of food as something that can be fixed with insulin so he can eat whatever he wants, needs to consider calories, fat, sodium, portion, etc to better manage weight and DM2 both. Pt reports he knows this and only eats like that maybe once every 3 months, however weight has always been 320-330# while I have been seeing patient since mid 2017 despite any dieting that he may report intermittently. Pt does not exercise, and seems to have very little physical activity outside of "working in the yard and garden" during warm months.

## 2017-03-10 ENCOUNTER — Ambulatory Visit: Payer: Self-pay | Admitting: *Deleted

## 2017-03-10 VITALS — BP 118/77 | HR 66 | Wt 324.0 lb

## 2017-03-10 DIAGNOSIS — Z794 Long term (current) use of insulin: Secondary | ICD-10-CM

## 2017-03-10 DIAGNOSIS — I10 Essential (primary) hypertension: Secondary | ICD-10-CM

## 2017-03-10 DIAGNOSIS — E118 Type 2 diabetes mellitus with unspecified complications: Secondary | ICD-10-CM

## 2017-03-10 NOTE — Progress Notes (Signed)
Pt reports leaving libre meter and phone at home today. Reports 143 last night and 313 this am. Trend showing AM readings highest. Takes lantus with novolog at dinner time. 68 units, then 64 units lantus in AM. Suggested speaking with DM manager about adjusting doses or times to better manage high AM readings. Also still taking Invokana daily and Trulicity weekly.

## 2017-03-13 ENCOUNTER — Ambulatory Visit: Payer: Self-pay | Admitting: *Deleted

## 2017-03-13 DIAGNOSIS — Z794 Long term (current) use of insulin: Principal | ICD-10-CM

## 2017-03-13 DIAGNOSIS — E118 Type 2 diabetes mellitus with unspecified complications: Secondary | ICD-10-CM

## 2017-03-13 NOTE — Progress Notes (Signed)
Assisted pt with changing Freestyle Libre meter as 14 days have lapsed and meter is reporting 1 hour remaining on current patch. Verbally walked pt through steps while he manually performed. Pt able to apply patch to L upper arm without difficulty. Pt still enjoying patch and reports blood sugars have been much more controlled, with most being 150-220, with only a few in the 300's whereas previously >300 was his average when he would rarely check his cbg. Pt has upcoming appts on 1-31 and 2-4 with Dr. Jimmy Footman for nephro, DM manager Patrick North and Dr. Ellyn Hack for cardio.

## 2017-03-14 ENCOUNTER — Ambulatory Visit: Payer: Self-pay | Admitting: Family

## 2017-03-14 ENCOUNTER — Encounter (HOSPITAL_COMMUNITY): Payer: Self-pay

## 2017-03-14 ENCOUNTER — Ambulatory Visit: Payer: Self-pay | Admitting: *Deleted

## 2017-03-14 VITALS — BP 148/76 | HR 75

## 2017-03-17 NOTE — Progress Notes (Signed)
Late entry: Pt in for weekly check in. Libre meter reading since new patch placed yesterday. CBGs in 180's-200s with a couple of <80 readings last night that pt corrected easily with po intake. Pt reports sx of hypoglycemia when cbg is <90. He believes this is due to being so used to his sugar running high (>200-300) for so long.  Taking all of next week off of work. Has a few appts including pcp and DM manager. Encouraged to speak with them about high AM sugars and nighttime insulin dosing as he reports first AM check is his hardest to manage time/readings. Will f/u with RN upon his return.

## 2017-03-24 ENCOUNTER — Ambulatory Visit: Payer: Self-pay | Admitting: *Deleted

## 2017-03-24 VITALS — BP 160/73 | HR 64 | Wt 327.0 lb

## 2017-03-24 DIAGNOSIS — I10 Essential (primary) hypertension: Secondary | ICD-10-CM

## 2017-03-24 DIAGNOSIS — E118 Type 2 diabetes mellitus with unspecified complications: Secondary | ICD-10-CM

## 2017-03-24 DIAGNOSIS — Z794 Long term (current) use of insulin: Principal | ICD-10-CM

## 2017-03-25 ENCOUNTER — Other Ambulatory Visit: Payer: Self-pay | Admitting: Cardiology

## 2017-03-27 ENCOUNTER — Ambulatory Visit: Payer: Self-pay | Admitting: Registered Nurse

## 2017-03-27 VITALS — BP 142/81 | HR 71 | Temp 98.1°F

## 2017-03-27 DIAGNOSIS — J0101 Acute recurrent maxillary sinusitis: Secondary | ICD-10-CM

## 2017-03-27 DIAGNOSIS — A084 Viral intestinal infection, unspecified: Secondary | ICD-10-CM

## 2017-03-27 MED ORDER — AZITHROMYCIN 250 MG PO TABS: ORAL_TABLET | ORAL | 0 refills | Status: DC

## 2017-03-27 MED ORDER — FLUTICASONE PROPIONATE 50 MCG/ACT NA SUSP: 1.0000 | Freq: Two times a day (BID) | NASAL | 6 refills | Status: DC | PRN

## 2017-03-27 MED ORDER — SALINE SPRAY 0.65 % NA SOLN: 2.0000 | NASAL | 0 refills | Status: DC

## 2017-03-27 MED ORDER — PROMETHAZINE HCL 12.5 MG PO TABS: 12.5000 mg | ORAL_TABLET | Freq: Three times a day (TID) | ORAL | 0 refills | Status: DC | PRN

## 2017-03-27 NOTE — Progress Notes (Signed)
Subjective:    Patient ID: Harold [name], male    DOB: 04/03/1955, 62 y.o.   MRN: [id number]  61y/o caucasian male established Pt c/o scratchy, burning throat, eyes feel swollen, Postnasal drip, maxillary sinus pressure, bil ear pressure. Reports pnd is making him nauseous. Has tried to eat something, but made him feel worse.  Around this time last year had pill he swallowed to settle his stomach did not dissolve under his tongue.  Sx since yesterday. Has not had tonsils removed. Strep once in distant Hx.  Allergic to penicillin and sulfa CBG currently 228.  Just did injections of diabetes medications and had snack  Last sinus infection azithromycin worked well for him.  Not using nose sprays consistently  + sick contacts at work with URIs      Review of Systems  Constitutional: Positive for fatigue. Negative for activity change, appetite change, chills, diaphoresis, fever and unexpected weight change.  HENT: Positive for congestion, ear pain, postnasal drip, sinus pressure, sinus pain and sore throat. Negative for dental problem, drooling, ear discharge, facial swelling, hearing loss, mouth sores, nosebleeds, rhinorrhea, sneezing, tinnitus, trouble swallowing and voice change.   Eyes: Negative for photophobia, pain, discharge, redness, itching and visual disturbance.  Respiratory: Positive for cough. Negative for choking, chest tightness, shortness of breath, wheezing and stridor.   Cardiovascular: Negative for chest pain, palpitations and leg swelling.  Gastrointestinal: Positive for nausea. Negative for abdominal distention, abdominal pain, blood in stool, diarrhea and vomiting.  Endocrine: Negative for cold intolerance and heat intolerance.  Genitourinary: Negative for dysuria.  Musculoskeletal: Negative for arthralgias, back pain, gait problem, joint swelling, myalgias, neck pain and neck stiffness.  Skin: Negative for color change, pallor, rash and wound.  Allergic/Immunologic:  Positive for environmental allergies. Negative for food allergies and immunocompromised state.  Neurological: Positive for headaches. Negative for dizziness, tremors, seizures, syncope, facial asymmetry, speech difficulty, weakness, light-headedness and numbness.  Hematological: Negative for adenopathy. Does not bruise/bleed easily.  Psychiatric/Behavioral: Negative for agitation, confusion and sleep disturbance.       Objective:   Physical Exam  Constitutional: He is oriented to person, place, and time. Vital signs are normal. He appears well-developed and well-nourished. He is active and cooperative.  Non-toxic appearance. He does not have a sickly appearance. He appears ill. No distress.  HENT:  Head: Normocephalic and atraumatic.  Right Ear: Hearing, external ear and ear canal normal. A middle ear effusion is present.  Left Ear: Hearing, external ear and ear canal normal. A middle ear effusion is present.  Nose: Mucosal edema and rhinorrhea present. No nose lacerations, sinus tenderness, nasal deformity, septal deviation or nasal septal hematoma. No epistaxis.  No foreign bodies. Right sinus exhibits maxillary sinus tenderness and frontal sinus tenderness. Left sinus exhibits maxillary sinus tenderness and frontal sinus tenderness.  Mouth/Throat: Uvula is midline and mucous membranes are normal. Mucous membranes are not pale, not dry and not cyanotic. He does not have dentures. No oral lesions. No trismus in the jaw. Normal dentition. No dental abscesses, uvula swelling, lacerations or dental caries. Posterior oropharyngeal edema and posterior oropharyngeal erythema present. No oropharyngeal exudate or tonsillar abscesses.  Maxillary sinuses exquisitely TTP bilaterally; frontal sinuses mildly TTP; lower eyelids swollen; bilateral allergic shiners; cobblestoning posterior pharynx; bilateral nasal turbinates cloudy discharge; bilateral TMs air fluid level clear  Eyes: Conjunctivae, EOM and lids  are normal. Pupils are equal, round, and reactive to light. Right eye exhibits no chemosis, no discharge, no exudate and no hordeolum.  No foreign body present in the right eye. Left eye exhibits no chemosis, no discharge, no exudate and no hordeolum. No foreign body present in the left eye. Right conjunctiva is not injected. Right conjunctiva has no hemorrhage. Left conjunctiva is not injected. Left conjunctiva has no hemorrhage. No scleral icterus. Right eye exhibits normal extraocular motion and no nystagmus. Left eye exhibits normal extraocular motion and no nystagmus. Right pupil is round and reactive. Left pupil is round and reactive. Pupils are equal.  Neck: Trachea normal, normal range of motion and phonation normal. Neck supple. Spinous process tenderness present. No tracheal tenderness and no muscular tenderness present. No neck rigidity. No tracheal deviation, no edema, no erythema and normal range of motion present. No thyroid mass and no thyromegaly present.  Cardiovascular: Normal rate, regular rhythm, S1 normal, S2 normal, normal heart sounds and intact distal pulses. PMI is not displaced. Exam reveals no gallop and no friction rub.  No murmur heard. Pulses:      Radial pulses are 2+ on the right side, and 2+ on the left side.  Pulmonary/Chest: Effort normal and breath sounds normal. No accessory muscle usage or stridor. No respiratory distress. He has no decreased breath sounds. He has no wheezes. He has no rhonchi. He has no rales. He exhibits no tenderness.  No cough observed in exam room; ;spoke full sentences without difficulty  Abdominal: Soft. Normal appearance. He exhibits no shifting dullness, no distension, no pulsatile liver, no fluid wave, no abdominal bruit, no ascites, no pulsatile midline mass and no mass. Bowel sounds are decreased. There is no tenderness. There is no rigidity, no rebound, no guarding, no CVA tenderness, no tenderness at McBurney's point and negative Murphy's  sign. Hernia confirmed negative in the ventral area.  Dull to percussion x 4 quads; hypoactive bowel sounds x 4 quads  Musculoskeletal: Normal range of motion. He exhibits no edema or tenderness.       Right shoulder: Normal.       Left shoulder: Normal.       Right elbow: Normal.      Left elbow: Normal.       Right hip: Normal.       Left hip: Normal.       Right knee: Normal.       Left knee: Normal.       Cervical back: Normal.       Thoracic back: Normal.       Lumbar back: Normal.       Right hand: Normal.       Left hand: Normal.  Lymphadenopathy:       Head (right side): No submental, no submandibular, no tonsillar, no preauricular, no posterior auricular and no occipital adenopathy present.       Head (left side): No submental, no submandibular, no tonsillar, no preauricular, no posterior auricular and no occipital adenopathy present.    He has no cervical adenopathy.       Right cervical: No superficial cervical, no deep cervical and no posterior cervical adenopathy present.      Left cervical: No superficial cervical, no deep cervical and no posterior cervical adenopathy present.  Neurological: He is alert and oriented to person, place, and time. He has normal strength. He is not disoriented. He displays no atrophy and no tremor. No cranial nerve deficit or sensory deficit. He exhibits normal muscle tone. He displays no seizure activity. Coordination and gait normal. GCS eye subscore is 4. GCS verbal subscore is 5.  GCS motor subscore is 6.  In/out of chair and on/off exam table without difficulty; gait sure and steady in hallway  Skin: Skin is warm, dry and intact. No abrasion, no bruising, no burn, no ecchymosis, no laceration, no lesion, no petechiae and no rash noted. He is not diaphoretic. No cyanosis or erythema. No pallor. Nails show no clubbing.  Psychiatric: He has a normal mood and affect. His speech is normal and behavior is normal. Judgment and thought content normal.  Cognition and memory are normal.  Nursing note and vitals reviewed.         Assessment & Plan:  A-recurrent acute maxillary sinusitis and nausea  P-Restart flonase 1 spray each nostril BID #1 RF6 electronic rx to pharmacy of his choice, saline 2 sprays each nostril q2h wa prn congestion given 1 bottle from clinic stock.  If no improvement with 48 hours of saline and flonase use start azithromycin 538m po day 1 then 2551mpo days 2-5 #6 RF0 dispensed from [facility]  Patient has taken previously and did not experience palpitations.  Discussed if feels like heart skipping a beat/irregular heart beat stop taking azithromycin and contact PCM/our office.  If chest pain/syncope/SOB/dyspnea then call 911/go to ER.  Discussed his regular medications and azithromycin could cause prolonged QT.  Patient refused doxycycline and is allergic to penicillin and sulfa. Denied personal or family history of ENT cancer.  Shower BID especially prior to bed. No evidence of systemic bacterial infection, non toxic and well hydrated.  I do not see where any further testing or imaging is necessary at this time.   I will suggest supportive care, rest, good hygiene and encourage the patient to take adequate fluids.  The patient is to return to clinic or EMERGENCY ROOM if symptoms worsen or change significantly.  Exitcare handout on sinusitis and sinus rinse given to patient.  Patient verbalized agreement and understanding of treatment plan and had no further questions at this time.   P2:  Hand washing and cover cough  I have recommended clear fluids and advance diet as tolerated soft avoid fried, spicy and dairy until symptoms completely resolve.  Exitcare handout on viral gastroenteritis given to patient.  Patient did not want zofran preferred phenergen discussed may cause drowsiness phenergen 12.8m36mo q8h prn nausea/vomiting #12 RF0 electronic Rx to pharmacy of his choice.  Discussed may try ginger ale.  Avoiding dehydration  especially due to his diabetes very important.  Return to the clinic if  symptoms persist or worsen; I have alerted the patient to call if high fever, dehydration, marked weakness, fainting, increased abdominal pain, blood in stool or vomit (red or black), unable to pee every 6 hours.  Patient verbalized agreement and understanding of treatment plan and had no further questions at this time.   P2:  Hand washing and fitness

## 2017-03-28 ENCOUNTER — Encounter: Payer: Self-pay | Admitting: Cardiology

## 2017-03-28 ENCOUNTER — Ambulatory Visit: Payer: No Typology Code available for payment source | Admitting: Cardiology

## 2017-03-28 VITALS — BP 128/68 | HR 79 | Ht 73.0 in | Wt 325.6 lb

## 2017-03-28 DIAGNOSIS — I10 Essential (primary) hypertension: Secondary | ICD-10-CM

## 2017-03-28 DIAGNOSIS — E1169 Type 2 diabetes mellitus with other specified complication: Secondary | ICD-10-CM

## 2017-03-28 DIAGNOSIS — I272 Pulmonary hypertension, unspecified: Secondary | ICD-10-CM | POA: Diagnosis not present

## 2017-03-28 DIAGNOSIS — I5032 Chronic diastolic (congestive) heart failure: Secondary | ICD-10-CM | POA: Diagnosis not present

## 2017-03-28 DIAGNOSIS — E782 Mixed hyperlipidemia: Secondary | ICD-10-CM

## 2017-03-28 DIAGNOSIS — Z9861 Coronary angioplasty status: Secondary | ICD-10-CM

## 2017-03-28 DIAGNOSIS — I878 Other specified disorders of veins: Secondary | ICD-10-CM

## 2017-03-28 DIAGNOSIS — I2510 Atherosclerotic heart disease of native coronary artery without angina pectoris: Secondary | ICD-10-CM

## 2017-03-28 DIAGNOSIS — R0609 Other forms of dyspnea: Secondary | ICD-10-CM | POA: Diagnosis not present

## 2017-03-28 MED ORDER — RIVAROXABAN 2.5 MG PO TABS: 2.5000 mg | ORAL_TABLET | Freq: Two times a day (BID) | ORAL | Status: DC

## 2017-03-28 MED ORDER — ASPIRIN EC 81 MG PO TBEC: 81.0000 mg | DELAYED_RELEASE_TABLET | Freq: Every day | ORAL | 3 refills | Status: DC

## 2017-03-28 NOTE — Patient Instructions (Addendum)
Sinus Rinse What is a sinus rinse? A sinus rinse is a simple home treatment that is used to rinse your sinuses with a sterile mixture of salt and water (saline solution). Sinuses are air-filled spaces in your skull behind the bones of your face and forehead that open into your nasal cavity. You will use the following:  Saline solution.  Neti pot or spray bottle. This releases the saline solution into your nose and through your sinuses. Neti pots and spray bottles can be purchased at [employer], a health food store, or online.  When would I do a sinus rinse? A sinus rinse can help to clear mucus, dirt, dust, or pollen from the nasal cavity. You may do a sinus rinse when you have a cold, a virus, nasal allergy symptoms, a sinus infection, or stuffiness in the nose or sinuses. If you are considering a sinus rinse:  Ask your child's health care provider before performing a sinus rinse on your child.  Do not do a sinus rinse if you have had ear or nasal surgery, ear infection, or blocked ears.  How do I do a sinus rinse?  Wash your hands.  Disinfect your device according to the directions provided and then dry it.  Use the solution that comes with your device or one that is sold separately in stores. Follow the mixing directions on the package.  Fill your device with the amount of saline solution as directed by the device instructions.  Stand over a sink and tilt your head sideways over the sink.  Place the spout of the device in your upper nostril (the one closer to the ceiling).  Gently pour or squeeze the saline solution into the nasal cavity. The liquid should drain to the lower nostril if you are not overly congested.  Gently blow your nose. Blowing too hard may cause ear pain.  Repeat in the other nostril.  Clean and rinse your device with clean water and then air-dry it. Are there risks of a sinus rinse? Sinus rinse is generally very safe and effective. However,  there are a few risks, which include:  A burning sensation in the sinuses. This may happen if you do not make the saline solution as directed. Make sure to follow all directions when making the saline solution.  Infection from contaminated water. This is rare, but possible.  Nasal irritation.  This information is not intended to replace advice given to you by your health care provider. Make sure you discuss any questions you have with your health care provider. Document Released: 09/01/2013 Document Revised: 01/02/2016 Document Reviewed: 06/22/2013 Elsevier Interactive Patient Education  2017 [employer] Sinusitis, Adult Sinusitis is soreness and inflammation of your sinuses. Sinuses are hollow spaces in the bones around your face. Your sinuses are located:  Around your eyes.  In the middle of your forehead.  Behind your nose.  In your cheekbones.  Your sinuses and nasal passages are lined with a stringy fluid (mucus). Mucus normally drains out of your sinuses. When your nasal tissues become inflamed or swollen, the mucus can become trapped or blocked so air cannot flow through your sinuses. This allows bacteria, viruses, and funguses to grow, which leads to infection. Sinusitis can develop quickly and last for 7?10 days (acute) or for more than 12 weeks (chronic). Sinusitis often develops after a cold. What are the causes? This condition is caused by anything that creates swelling in the sinuses or stops mucus from draining, including:  Allergies.  Asthma.  Bacterial or viral infection.  Abnormally shaped bones between the nasal passages.  Nasal growths that contain mucus (nasal polyps).  Narrow sinus openings.  Pollutants, such as chemicals or irritants in the air.  A foreign object stuck in the nose.  A fungal infection. This is rare.  What increases the risk? The following factors may make you more likely to develop this condition:  Having allergies or  asthma.  Having had a recent cold or respiratory tract infection.  Having structural deformities or blockages in your nose or sinuses.  Having a weak immune system.  Doing a lot of swimming or diving.  Overusing nasal sprays.  Smoking.  What are the signs or symptoms? The main symptoms of this condition are pain and a feeling of pressure around the affected sinuses. Other symptoms include:  Upper toothache.  Earache.  Headache.  Bad breath.  Decreased sense of smell and taste.  A cough that may get worse at night.  Fatigue.  Fever.  Thick drainage from your nose. The drainage is often green and it may contain pus (purulent).  Stuffy nose or congestion.  Postnasal drip. This is when extra mucus collects in the throat or back of the nose.  Swelling and warmth over the affected sinuses.  Sore throat.  Sensitivity to light.  How is this diagnosed? This condition is diagnosed based on symptoms, a medical history, and a physical exam. To find out if your condition is acute or chronic, your health care provider may:  Look in your nose for signs of nasal polyps.  Tap over the affected sinus to check for signs of infection.  View the inside of your sinuses using an imaging device that has a light attached (endoscope).  If your health care provider suspects that you have chronic sinusitis, you may also:  Be tested for allergies.  Have a sample of mucus taken from your nose (nasal culture) and checked for bacteria.  Have a mucus sample examined to see if your sinusitis is related to an allergy.  If your sinusitis does not respond to treatment and it lasts longer than 8 weeks, you may have an MRI or CT scan to check your sinuses. These scans also help to determine how severe your infection is. In rare cases, a bone biopsy may be done to rule out more serious types of fungal sinus disease. How is this treated? Treatment for sinusitis depends on the cause and  whether your condition is chronic or acute. If a virus is causing your sinusitis, your symptoms will go away on their own within 10 days. You may be given medicines to relieve your symptoms, including:  Topical nasal decongestants. They shrink swollen nasal passages and let mucus drain from your sinuses.  Antihistamines. These drugs block inflammation that is triggered by allergies. This can help to ease swelling in your nose and sinuses.  Topical nasal corticosteroids. These are nasal sprays that ease inflammation and swelling in your nose and sinuses.  Nasal saline washes. These rinses can help to get rid of thick mucus in your nose.  If your condition is caused by bacteria, you will be given an antibiotic medicine. If your condition is caused by a fungus, you will be given an antifungal medicine. Surgery may be needed to correct underlying conditions, such as narrow nasal passages. Surgery may also be needed to remove polyps. Follow these instructions at home: Medicines  Take, use, or apply over-the-counter and prescription medicines only as told by  your health care provider. These may include nasal sprays.  If you were prescribed an antibiotic medicine, take it as told by your health care provider. Do not stop taking the antibiotic even if you start to feel better. Hydrate and Humidify  Drink enough water to keep your urine clear or pale yellow. Staying hydrated will help to thin your mucus.  Use a cool mist humidifier to keep the humidity level in your home above 50%.  Inhale steam for 10-15 minutes, 3-4 times a day or as told by your health care provider. You can do this in the bathroom while a hot shower is running.  Limit your exposure to cool or dry air. Rest  Rest as much as possible.  Sleep with your head raised (elevated).  Make sure to get enough sleep each night. General instructions  Apply a warm, moist washcloth to your face 3-4 times a day or as told by your  health care provider. This will help with discomfort.  Wash your hands often with soap and water to reduce your exposure to viruses and other germs. If soap and water are not available, use hand sanitizer.  Do not smoke. Avoid being around people who are smoking (secondhand smoke).  Keep all follow-up visits as told by your health care provider. This is important. Contact a health care provider if:  You have a fever.  Your symptoms get worse.  Your symptoms do not improve within 10 days. Get help right away if:  You have a severe headache.  You have persistent vomiting.  You have pain or swelling around your face or eyes.  You have vision problems.  You develop confusion.  Your neck is stiff.  You have trouble breathing. This information is not intended to replace advice given to you by your health care provider. Make sure you discuss any questions you have with your health care provider. Document Released: 02/04/2005 Document Revised: 10/01/2015 Document Reviewed: 11/30/2014 Elsevier Interactive Patient Education  2018 [employer].  Viral Gastroenteritis, Adult Viral gastroenteritis is also known as the stomach flu. This condition is caused by various viruses. These viruses can be passed from person to person very easily (are very contagious). This condition may affect your stomach, small intestine, and large intestine. It can cause sudden watery diarrhea, fever, and vomiting. Diarrhea and vomiting can make you feel weak and cause you to become dehydrated. You may not be able to keep fluids down. Dehydration can make you tired and thirsty, cause you to have a dry mouth, and decrease how often you urinate. Older adults and people with other diseases or a weak immune system are at higher risk for dehydration. It is important to replace the fluids that you lose from diarrhea and vomiting. If you become severely dehydrated, you may need to get fluids through an IV tube. What are  the causes? Gastroenteritis is caused by various viruses, including rotavirus and norovirus. Norovirus is the most common cause in adults. You can get sick by eating food, drinking water, or touching a surface contaminated with one of these viruses. You can also get sick from sharing utensils or other personal items with an infected person. What increases the risk? This condition is more likely to develop in people: Who have a weak defense system (immune system). Who live with one or more children who are younger than 58 years old. Who live in a nursing home. Who go on cruise ships.  What are the signs or symptoms? Symptoms of  this condition start suddenly 1-2 days after exposure to a virus. Symptoms may last a few days or as long as a week. The most common symptoms are watery diarrhea and vomiting. Other symptoms include: Fever. Headache. Fatigue. Pain in the abdomen. Chills. Weakness. Nausea. Muscle aches. Loss of appetite.  How is this diagnosed? This condition is diagnosed with a medical history and physical exam. You may also have a stool test to check for viruses or other infections. How is this treated? This condition typically goes away on its own. The focus of treatment is to restore lost fluids (rehydration). Your health care provider may recommend that you take an oral rehydration solution (ORS) to replace important salts and minerals (electrolytes) in your body. Severe cases of this condition may require giving fluids through an IV tube. Treatment may also include medicine to help with your symptoms. Follow these instructions at home: Follow instructions from your health care provider about how to care for yourself at home. Eating and drinking Follow these recommendations as told by your health care provider: Take an ORS. This is a drink that is sold at pharmacies and retail stores. Drink clear fluids in small amounts as you are able. Clear fluids include water, ice chips,  diluted fruit juice, and low-calorie sports drinks. Eat bland, easy-to-digest foods in small amounts as you are able. These foods include bananas, applesauce, rice, lean meats, toast, and crackers. Avoid fluids that contain a lot of sugar or caffeine, such as energy drinks, sports drinks, and soda. Avoid alcohol. Avoid spicy or fatty foods.  General instructions  Drink enough fluid to keep your urine clear or pale yellow. Wash your hands often. If soap and water are not available, use hand sanitizer. Make sure that all people in your household wash their hands well and often. Take over-the-counter and prescription medicines only as told by your health care provider. Rest at home while you recover. Watch your condition for any changes. Take a warm bath to relieve any burning or pain from frequent diarrhea episodes. Keep all follow-up visits as told by your health care provider. This is important. Contact a health care provider if: You cannot keep fluids down. Your symptoms get worse. You have new symptoms. You feel light-headed or dizzy. You have muscle cramps. Get help right away if: You have chest pain. You feel extremely weak or you faint. You see blood in your vomit. Your vomit looks like coffee grounds. You have bloody or black stools or stools that look like tar. You have a severe headache, a stiff neck, or both. You have a rash. You have severe pain, cramping, or bloating in your abdomen. You have trouble breathing or you are breathing very quickly. Your heart is beating very quickly. Your skin feels cold and clammy. You feel confused. You have pain when you urinate. You have signs of dehydration, such as: Dark urine, very little urine, or no urine. Cracked lips. Dry mouth. Sunken eyes. Sleepiness. Weakness. This information is not intended to replace advice given to you by your health care provider. Make sure you discuss any questions you have with your health care  provider. Document Released: 02/04/2005 Document Revised: 07/19/2015 Document Reviewed: 10/11/2014 Elsevier Interactive Patient Education  [employer].

## 2017-03-28 NOTE — Progress Notes (Signed)
PCP: Deland Pretty, MD Nephrology, Dr. Jimmy Footman, Pulmonology: Dr. Melvyn Novas  Clinic Note: Chief Complaint  Patient presents with  . 6 month f/u visit    pt states no new Sx.  . Coronary Artery Disease    HPI: Harold [name] is a 62 y.o. male with a PMH below who presents today for 4 month f/u CAD - status post multivessel PCI. He also has difficult to manage dyslipidemia, morbid obesity, difficult to control DM-2 & chronic venous stasis (now better controlled)  Harold [name] was last seen in June 2018. - doing very well from a CV standpoint.  Edema well controlled. Was in very good spirits. Was due to see a spine surgeon for his persistent low back pain. Was also following up with neurology for his CPAP.  Recent Hospitalizations: n/a - did not tolerate nerve stimulator trial -Successfully underwent trigger finger surgery  Studies Personally Reviewed - (if available, images/films reviewed: From Epic Chart or Care Everywhere)  No new studies  Interval History: Harold [name] presents today doing quite well overall from a cardiac standpoint.  Unfortunately, he still really cannot do a lot of exercising because of his knees and back pain.  He did try water aerobics and water walking exercises, but did not seem to like it very much.  As such then he is not really been able lose weight.  He has not had any anginal symptoms of chest tightness or pressure with rest or exertion.  He has his mild baseline exertional dyspnea but no significant DOE.  He is in his usual usual good spirits and denies any PND, orthopnea symptoms.  He does have sleep apnea -trying to undergo evaluation to restart CPAP.  He says that his lower extremity legs are doing much better.  Edema is well controlled.    He still takes furosemide but is also now on Invokana.  The combination of the 2 has him diuresing quite well.  The venous stasis ulcers are all healed and the dermatitis changes have actually improved.  He is  now on fenofibrate and Crestor.  He is due for lab check soon.  Seems to be tolerating this well without any significant myalgias now.  His only major issue today is that he is dealing with a cold that started over the weekend.  No fevers or chills yet. Denies any rapid irregular heartbeats or palpitations. No syncope/near syncope or TIA/amaurosis fugax. No melena, hematochezia, hematuria or epistaxis.  No claudication.  ROS: A comprehensive was performed. Review of Systems  Constitutional: Negative for malaise/fatigue and weight loss.  HENT: Negative for congestion and nosebleeds.        Getting over seasonal allergies  Respiratory: Positive for shortness of breath (baseline). Negative for cough.   Cardiovascular: Negative for leg swelling (well controlled).  Gastrointestinal: Negative for constipation and heartburn.  Genitourinary: Negative for dysuria.       Frequent urination  Musculoskeletal: Positive for back pain (with radiculopathy) and joint pain (Knees.).       Doing better after his hand surgery.  Neurological: Positive for tingling and tremors. Negative for dizziness and weakness.  Endo/Heme/Allergies: Positive for environmental allergies.  Psychiatric/Behavioral: Negative for depression and memory loss. The patient is not nervous/anxious.   All other systems reviewed and are negative.   I have reviewed and (if needed) personally updated the patient's problem list, medications, allergies, past medical and surgical history, social and family history.   Past Medical History:  Diagnosis Date  . Anginal pain (  [facility]) March 2015   Cardiac cath showed patent stents with distal LAD, circumflex-OM and RCA disease in small vessels.  . Anxiety   . CAD S/P percutaneous coronary angioplasty 2003, 04/2012   status post PCI to LAD, circumflex-OM 2, RCA  . Carotid artery occlusion   . Chronic renal insufficiency, stage II (mild)   . Chronic venous insufficiency    varicosities, no  reflux; dopplers 04/14/12- valvular insufficiency in the R and L GSV  . Complication of anesthesia   . COPD, mild ([place])   . Depression    situaltional   . Diabetes mellitus   . Dyslipidemia associated with type 2 diabetes mellitus ([place])   . GERD (gastroesophageal reflux disease)   . HTN (hypertension)   . Hypothyroidism   . Neuromuscular disorder (HCC)    neuropathy in feet  . Neuropathy    notably improved following PCI with improved cardiac function  . Obesity   . 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 PONV (postoperative nausea and vomiting)    "[place]"  . Pulmonary hypertension ([place]) 04/2012   PA pressure 44mhg  . Sleep apnea    uses nightly    Past Surgical History:  Procedure Laterality Date  . ABDOMINAL AORTAGRAM N/A 11/15/2011   Procedure: ABDOMINAL AMaxcine Ham  Surgeon: CElam Dutch MD;  [facility];  Service: Cardiovascular;  [name];  . ABIs  04/27/2012   mild bilateral arterial insufficiency  . BACK SURGERY  2005 x1   X2-2010  . BUNIONECTOMY Right 11/11/2013   Procedure: RIGHT FOOT SILVER BUNIONECTOMY;  Surgeon: JWylene Simmer MD;  [place]  Service: Orthopedics;  Laterality: Right;  . CARDIAC CATHETERIZATION  12/11/2001   significant 3V CAD, normal LV function  . Carotid Doppler  04/27/2012   right internal carotid: Elevated velocities but no evidence of plaque. Left internal carotid 40-59%  . CAROTID ENDARTERECTOMY  2005   Right; recent carotid Dopplers notes elevated velocities.   . colonscopy    . CORONARY ANGIOPLASTY  12/21/2001   PTCA of the distal and mid AV groove circ, unsuccessful PTCA of second OM total occlusion, unsuccessful PTCA of the apical LAD total occlusion  . CORONARY ANGIOPLASTY WITH STENT PLACEMENT  04/29/12   PCI to 3 RCA lesions, Promus Premiere 2.262m8mm distally, mid was 2.49m549mmm and proximally  2.75x16m61mF 55-60%  . CORONARY STENT PLACEMENT  04/28/12   PCI to LAD (3x23mm349mnce DES postdilated to 3.25) and circ prox and mid (2 overlappinmg 2.49mmx158mXience DES postdilated to 2.749mm) 37mOPPLER ECHOCARDIOGRAPHY  04/28/2012   poor quality study: EF estimated 60-65%; unable to assess diastolic function (previously noted to have diastolic dysfunction); severely dilated left atrium and mild right atrium; dilated IVC consistent with increased central venous pressure..  . HERNIA REPAIR    . LEFT AND RIGHT HEART CATHETERIZATION WITH CORONARY ANGIOGRAM N/A 04/27/2012   Procedure: LEFT AND RIGHT HEART CATHETERIZATION WITH CORONARY ANGIOGRAM;  Surgeon: David WLeonie [facility];  Service: Cardiovascular;  [name];  . LEFT AND RIGHT HEART CATHETERIZATION WITH CORONARY ANGIOGRAM N/A 05/14/2013   Procedure: LEFT AND RIGHT HEART CATHETERIZATION WITH CORONARY ANGIOGRAM;  Surgeon: Thomas Troy SineLocation[facility]: Moderate Pulm HTN: 46/16 - mean 33 mmHg; PCWP 22mmHg;43mltivessel CAD with widely patent mid LAD stents and 90% apical LAD, Patent Cx stents -  distal small vessel Dz, Patent RCA ostial mid and distal stents - 70% distal runoff Dz  . LUMBAR LAMINECTOMY/DECOMPRESSION MICRODISCECTOMY  01/07/2012   Procedure: LUMBAR LAMINECTOMY/DECOMPRESSION MICRODISCECTOMY 1 LEVEL;  Surgeon: Winfield Cunas, MD;  Location: [place] NEURO ORS;  Service: Neurosurgery;  Laterality: Bilateral;   Lumbar Three-Four Decompression  . PERCUTANEOUS CORONARY STENT INTERVENTION (PCI-S) N/A 04/28/2012   Procedure: PERCUTANEOUS CORONARY STENT INTERVENTION (PCI-S);  Surgeon: Troy Sine, MD;  [facility];  Service: Cardiovascular;  [name];  . PERCUTANEOUS CORONARY STENT INTERVENTION (PCI-S) N/A 04/29/2012   Procedure: PERCUTANEOUS CORONARY STENT INTERVENTION (PCI-S);  Surgeon: Leonie Man, MD;  Location: [facility] CATH LAB;  Service: Cardiovascular;  [name];  . SPINE SURGERY      . UMBILICAL HERNIA REPAIR  2009   steel mesh insert    Current Meds  Medication Sig  . azithromycin (ZITHROMAX) 250 MG tablet Take 2 tablets by mouth now then 1 tablet by mouth daily days 2 through 5  . canagliflozin (INVOKANA) 100 MG TABS tablet Take 100 mg by mouth daily before breakfast.  . carvedilol (COREG) 6.25 MG tablet TAKE 1 TABLET (6.25 MG TOTAL) BY MOUTH 2 (TWO) TIMES DAILY.  Marland Kitchen Choline Fenofibrate (FENOFIBRIC ACID) 135 MG CPDR Take 1 capsule by mouth daily.  . Continuous Blood Gluc Receiver (FREESTYLE LIBRE 14 DAY READER) DEVI See admin instructions.  . Continuous Blood Gluc Sensor (FREESTYLE LIBRE 14 DAY SENSOR) MISC APPLY 1 SENSOR EVERY 14 DAYS  . escitalopram (LEXAPRO) 20 MG tablet TAKE 1 TABLET (20 MG TOTAL) BY MOUTH DAILY.  . fluticasone (FLONASE) 50 MCG/ACT nasal spray Place 1 spray into both nostrils 2 (two) times daily as needed for allergies or rhinitis.  . furosemide (LASIX) 80 MG tablet Take 80 mg by mouth daily.  . insulin aspart (NOVOLOG) 100 UNIT/ML injection Inject 34 Units into the skin 3 (three) times daily with meals. IN ADDITION 25 MG ONCE ADAY prn  . LANTUS SOLOSTAR 100 UNIT/ML Solostar Pen 68units in AM, 70 units in PM  . levothyroxine (SYNTHROID, LEVOTHROID) 88 MCG tablet Take 88 mcg by mouth daily before breakfast.  . NITROSTAT 0.4 MG SL tablet PLACE 1 TAB UNDER THE TONGUE EVERY 5 MINUTES AS NEEDED FOR CHEST PAIN (SEVERE PRESSURE OR TIGHTNESS)  . omeprazole (PRILOSEC) 20 MG capsule Take 20 mg by mouth daily.  . promethazine (PHENERGAN) 12.5 MG tablet Take 1 tablet (12.5 mg total) by mouth every 8 (eight) hours as needed for up to 3 days for nausea or vomiting.  . rosuvastatin (CRESTOR) 10 MG tablet Take 20 mg by mouth daily.   . sodium chloride (OCEAN) 0.65 % SOLN nasal spray Place 2 sprays into both nostrils every 2 (two) hours while awake.  . telmisartan (MICARDIS) 80 MG tablet Take 80 mg by mouth daily.  . TRULICITY 1.5 HO/8.8LN SOPN Inject 1.5 mg into  the skin once a week.  . [DISCONTINUED] BRILINTA 90 MG TABS tablet TAKE 1 TABLET BY MOUTH 2 TIMES DAILY. KEEP OFFFICE VISIT.    Allergies  Allergen Reactions  . Septra [Bactrim] Itching  . Penicillins Hives    Allergy to All cillin drugs  Ancef given 9/24 with no obvious reaction  . Clopidogrel     NON RESPONDER  P2Y12  . Metformin And Related   . Other     Walnuts, pecans     Social History   Tobacco Use  . Smoking status: Former Smoker    Packs/day: 1.00  Years: 42.00    Pack years: 42.00    Types: Cigarettes    Last attempt to quit: 03/23/2003    Years since quitting: 14.0  . Smokeless tobacco: Never Used  Substance Use Topics  . Alcohol use: No    Alcohol/week: 0.0 oz  . Drug use: No   Social History   Social History Narrative   He and his partner Harold [name] have now officially gotten married on 05/26/2013.  Harold [name] is also a patient of our clinic.  He is now initially hyphenated his last name.   He works at [employer].   He does not smoke, he quit in 2009.  He does not drink alcohol.   He was adopted and no family history is known.    He was adopted. Family history is unknown by patient.  Wt Readings from Last 3 Encounters:  03/28/17 (!) 325 lb 9.6 oz (147.7 kg)  03/24/17 (!) 327 lb (148.3 kg)  03/10/17 (!) 324 lb (147 kg)  329 lb in 07/2016  PHYSICAL EXAM BP 128/68 (BP Location: Right Arm, Patient Position: Sitting, Cuff Size: Large)   Pulse 79   Ht _0  (1.854 m)   Wt (!) 325 lb 9.6 oz (147.7 kg)   BMI 42.96 kg/m   Physical Exam  Constitutional: He is oriented to person, place, and time. He appears well-developed and well-nourished. No distress.  Morbidly obese  HENT:  Head: Normocephalic and atraumatic.  Eyes: EOM are normal.  Neck: Normal range of motion. Neck supple. No hepatojugular reflux and no JVD present. Carotid bruit is not present (Right CAE scar noted).  Cardiovascular: Normal rate, regular rhythm, S1 normal and S2 normal.  No  extrasystoles are present. PMI is not displaced (Cannot palpate). Exam reveals distant heart sounds and decreased pulses (Mildly diminished pedal pulses.). Exam reveals no gallop.  No murmur heard. Pulmonary/Chest: Effort normal and breath sounds normal. No respiratory distress. He has no wheezes. He has no rales.  Abdominal: Soft. Bowel sounds are normal.  Profound truncal obesity  Musculoskeletal: Normal range of motion. He exhibits edema (Trivial).  Neurological: He is alert and oriented to person, place, and time.  Skin: Skin is warm and dry.  Notably improved venous stasis dermatitis in bilateral lower legs.  No lesion.  Psychiatric: He has a normal mood and affect. His behavior is normal. Judgment and thought content normal.     Adult ECG Report  Rate: 79;  Rhythm: normal sinus rhythm and normal axis, intervals and durations  Narrative Interpretation: stable EKG  Other studies Reviewed: Additional studies/ records that were reviewed today include:  Recent Labs:  Being monitored by PCP Lab Results  Component Value Date   CHOL 191 09/05/2016   HDL 43 09/05/2016   LDLCALC 90 09/05/2016   TRIG 291 (H) 09/05/2016   CHOLHDL 4.4 09/05/2016    ASSESSMENT / PLAN: Overall stable - no medication changes.  Problem List Items Addressed This Visit    CAD, LAD/CFX DES 04/28/12- staged RCA DES 04/29/12 after pt declined CABG, cath 05/14/13 stable CAD medical therapy - Primary (Chronic)    He had severe three-vessel disease.  Opted for multivessel PCI as opposed to CABG.  Symptoms are well controlled since PCI with no active angina. Last cath in March 2015 showed patent stents with just distal LAD and RCA disease that were not PCI amenable.  No recurrent anginal symptoms now. He is back on statin.  He is on a stable dose of beta-blocker and ARB.  With his PAD including carotid disease, I will convert him from Brilinta to aspirin +2.5 mg Xarelto based on recent data indicating better long-term  survivability and reduced negative outcomes with this combination over Brilinta alone.      Relevant Medications   rivaroxaban (XARELTO) 2.5 MG TABS tablet   aspirin EC 81 MG tablet   Other Relevant Orders   EKG 12-Lead   Comprehensive metabolic panel   Chronic diastolic heart failure, NYHA class 2 - PCWPf 22 mmHg during catheterization. (Chronic)    Mostly diastolic dysfunction with probably also obesity hypoventilation syndrome symptoms. He is now stable on her current daily dose of Lasix.  Very minimal additional dosing required since starting Invokana. Blood pressures well controlled with carvedilol and telmisartan.  No changes.      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  Due for labs to be checked now.  Pending results, may have him follow-up with our Cardiovascular Risk Reduction Clinic (CV RR -run by our clinical pharmacist.  May consider PCSK9 inhibitor if not at goal).  I would not titrate his statin any further with the combination of fenofibrate simply because he has had profound myalgias in the past.        Relevant Medications   rivaroxaban (XARELTO) 2.5 MG TABS tablet   aspirin EC 81 MG tablet   Other Relevant Orders   Lipid panel   Comprehensive metabolic panel   Dyspnea on exertion   (Chronic)   Relevant Orders   EKG 12-Lead   Essential hypertension (Chronic)    Well-controlled on current regimen.  No change      Relevant Medications   rivaroxaban (XARELTO) 2.5 MG TABS tablet   aspirin EC 81 MG tablet   Morbid (severe) obesity due to excess calories (HCC) (Chronic)    Certainly compounds his dyspnea.  The fact that he is not able to do any walking because of his knees bothering him is troublesome.  I again  recommended water exercises and aerobics.  Not sure if diet alone will help.  May be some of his diabetes medications will help.      Relevant Orders   Lipid panel   Comprehensive metabolic panel   Pulmonary hypertension, 31mhg (Chronic)    Probably related to obesity/OHS/OSA and COPD with some diastolic component.  All mixed together. Plan is continued blood pressure control and afterload reduction along with diuretic.  Doing relatively well and stable.  We he would benefit from getting back on CPAP.      Relevant Medications   rivaroxaban (XARELTO) 2.5 MG TABS tablet   aspirin EC 81 MG tablet   Venous stasis of both lower extremities - with edema (Chronic)    Actually looks very well controlled at this point.  Dermatitis is all faded.  He still uses support stockings when he can put them on.  Not wearing today.      Relevant Medications   rivaroxaban (XARELTO) 2.5 MG TABS tablet   aspirin EC 81 MG tablet     I did provide a note for his employer indicating that he does have 2 medications (furosemide and Invokana) that would make him urinate frequently.  For this reason, he may need to stop working intermittently  to use the bathroom.  Current medicines are reviewed at length with the patient today. (+/- concerns) n/a The following changes have been made: n/a  Patient Instructions  MEDICATION INSTRUCTIONS    COMPLETED BRILINTA , THEN START TAKING XARELTO 2.5 MG TWICE A DAY,ALONG WITH ASPIRIN 81 MG DAILY. CALL OFFICE WHEN YOU ARE READY TO SWITCH MEDICATIONS.  NO OTHER CHANGES    Your physician wants you to follow-up in [place]. You will receive a reminder letter in the mail two months in advance. If you don't receive a letter, please call our office to schedule the follow-up appointment.   If you need a refill on your cardiac medications before your next appointment, please call your pharmacy.    Studies Ordered:   Orders Placed This Encounter    Procedures  . Lipid panel  . Comprehensive metabolic panel  . EKG 12-Lead      Glenetta Hew, M.D., M.S. Interventional Cardiologist   Pager # [phone] Phone # [phone] [address] [place], [place] [address]

## 2017-03-28 NOTE — Patient Instructions (Addendum)
MEDICATION INSTRUCTIONS    COMPLETED BRILINTA , THEN START TAKING XARELTO 2.5 MG TWICE A DAY,ALONG WITH ASPIRIN 81 MG DAILY. CALL OFFICE WHEN YOU ARE READY TO SWITCH MEDICATIONS.  NO OTHER CHANGES    Your physician wants you to follow-up in [place]. You will receive a reminder letter in the mail two months in advance. If you don't receive a letter, please call our office to schedule the follow-up appointment.   If you need a refill on your cardiac medications before your next appointment, please call your pharmacy.

## 2017-03-30 ENCOUNTER — Encounter: Payer: Self-pay | Admitting: Cardiology

## 2017-03-30 NOTE — Assessment & Plan Note (Signed)
Probably related to obesity/OHS/OSA and COPD with some diastolic component.  All mixed together. Plan is continued blood pressure control and afterload reduction along with diuretic.  Doing relatively well and stable.  We he would benefit from getting back on CPAP.

## 2017-03-30 NOTE — Assessment & Plan Note (Signed)
Back on statin and fenofibrate.  Due for labs to be checked now.  Pending results, may have him follow-up with our Cardiovascular Risk Reduction Clinic (CV RR -run by our clinical pharmacist.  May consider PCSK9 inhibitor if not at goal).  I would not titrate his statin any further with the combination of fenofibrate simply because he has had profound myalgias in the past.

## 2017-03-30 NOTE — Assessment & Plan Note (Signed)
Certainly compounds his dyspnea.  The fact that he is not able to do any walking because of his knees bothering him is troublesome.  I again recommended water exercises and aerobics.  Not sure if diet alone will help.  May be some of his diabetes medications will help.

## 2017-03-30 NOTE — Assessment & Plan Note (Signed)
Actually looks very well controlled at this point.  Dermatitis is all faded.  He still uses support stockings when he can put them on.  Not wearing today.

## 2017-03-30 NOTE — Assessment & Plan Note (Signed)
Mostly diastolic dysfunction with probably also obesity hypoventilation syndrome symptoms. He is now stable on her current daily dose of Lasix.  Very minimal additional dosing required since starting Invokana. Blood pressures well controlled with carvedilol and telmisartan.  No changes.

## 2017-03-30 NOTE — Assessment & Plan Note (Signed)
Well-controlled on current regimen.  No change

## 2017-03-30 NOTE — Assessment & Plan Note (Signed)
He had severe three-vessel disease.  Opted for multivessel PCI as opposed to CABG.  Symptoms are well controlled since PCI with no active angina. Last cath in March 2015 showed patent stents with just distal LAD and RCA disease that were not PCI amenable.  No recurrent anginal symptoms now. He is back on statin.  He is on a stable dose of beta-blocker and ARB. With his PAD including carotid disease, I will convert him from Brilinta to aspirin +2.5 mg Xarelto based on recent data indicating better long-term survivability and reduced negative outcomes with this combination over Brilinta alone.

## 2017-04-01 ENCOUNTER — Ambulatory Visit: Payer: Self-pay | Admitting: Registered Nurse

## 2017-04-01 VITALS — BP 123/76 | HR 73 | Temp 97.3°F

## 2017-04-01 DIAGNOSIS — J0101 Acute recurrent maxillary sinusitis: Secondary | ICD-10-CM

## 2017-04-01 DIAGNOSIS — J441 Chronic obstructive pulmonary disease with (acute) exacerbation: Secondary | ICD-10-CM

## 2017-04-01 MED ORDER — DOXYCYCLINE HYCLATE 100 MG PO TABS: 100.0000 mg | ORAL_TABLET | Freq: Two times a day (BID) | ORAL | 0 refills | Status: AC

## 2017-04-01 MED ORDER — ALBUTEROL SULFATE HFA 108 (90 BASE) MCG/ACT IN AERS: 1.0000 | INHALATION_SPRAY | RESPIRATORY_TRACT | 0 refills | Status: DC | PRN

## 2017-04-01 MED ORDER — PREDNISONE 10 MG PO TABS: 30.0000 mg | ORAL_TABLET | Freq: Every day | ORAL | 0 refills | Status: AC

## 2017-04-01 NOTE — Progress Notes (Signed)
Subjective:    Patient ID: Harold [name], male    DOB: 06-13-1955, 62 y.o.   MRN: [id number]  61y/o caucasian male established Pt seen on 03/27/17 for acute sinusitis. Started Flonase, saline spray, Z-pak. Still with same sx of maxillary sinus pressure, ShOB, chest congestion, prod cough, ear popping. States sore throat has resolved. Has been taking phenergan for nausea. No vomiting. Decreased appetite but tolerating po intake easily now per patient compared to last week. States CBG elevated during illness, 300-350 on average. Has been hydrating and carrying his water mug with him at work.  PMHx COPD and weather changes from 70s to 20s at night then highs of 40 and windy the past two weeks raining today.  Humidity 40 to 100% varying greatly during the week.  Has used inhaler before does not have one at home at this time.      Review of Systems  Constitutional: Positive for appetite change and fatigue. Negative for activity change, chills, diaphoresis, fever and unexpected weight change.  HENT: Positive for congestion, postnasal drip, rhinorrhea, sinus pressure, sinus pain and sore throat. Negative for dental problem, drooling, ear discharge, ear pain, facial swelling, hearing loss, mouth sores, nosebleeds, sneezing, tinnitus, trouble swallowing and voice change.   Eyes: Negative for photophobia, pain, discharge, redness, itching and visual disturbance.  Respiratory: Positive for cough and shortness of breath. Negative for choking, chest tightness, wheezing and stridor.   Cardiovascular: Negative for chest pain, palpitations and leg swelling.  Gastrointestinal: Positive for nausea. Negative for abdominal distention, abdominal pain, blood in stool, constipation, diarrhea and vomiting.  Endocrine: Negative for cold intolerance and heat intolerance.  Genitourinary: Negative for dysuria.  Musculoskeletal: Negative for arthralgias, back pain, gait problem, joint swelling, myalgias, neck pain and  neck stiffness.  Skin: Negative for color change, pallor, rash and wound.  Allergic/Immunologic: Positive for environmental allergies. Negative for food allergies and immunocompromised state.  Neurological: Negative for dizziness, tremors, seizures, syncope, facial asymmetry, speech difficulty, weakness, light-headedness, numbness and headaches.  Hematological: Negative for adenopathy. Does not bruise/bleed easily.  Psychiatric/Behavioral: Negative for agitation, confusion and sleep disturbance.       Objective:   Physical Exam  Constitutional: He is oriented to person, place, and time. Vital signs are normal. He appears well-developed and well-nourished. He is active and cooperative.  Non-toxic appearance. He does not have a sickly appearance. He appears ill. No distress.  HENT:  Head: Normocephalic and atraumatic.  Right Ear: Hearing, external ear and ear canal normal. A middle ear effusion is present.  Left Ear: Hearing, external ear and ear canal normal. A middle ear effusion is present.  Nose: Mucosal edema and rhinorrhea present. No nose lacerations, sinus tenderness, nasal deformity, septal deviation or nasal septal hematoma. No epistaxis.  No foreign bodies. Right sinus exhibits maxillary sinus tenderness. Right sinus exhibits no frontal sinus tenderness. Left sinus exhibits maxillary sinus tenderness. Left sinus exhibits no frontal sinus tenderness.  Mouth/Throat: Uvula is midline and mucous membranes are normal. Mucous membranes are not pale, not dry and not cyanotic. He does not have dentures. No oral lesions. No trismus in the jaw. Normal dentition. No dental abscesses, uvula swelling, lacerations or dental caries. Posterior oropharyngeal edema and posterior oropharyngeal erythema present. No oropharyngeal exudate or tonsillar abscesses.  Frequent nose blowing clear discharge bilateral nares, bilateral lower eyelids still swollen but not as pronounced; moderately tender to palpation  bilateral maxillary sinuses; bilateral allergic shiners; bilateral TMs air fluid level clear  Eyes: Conjunctivae, EOM  and lids are normal. Pupils are equal, round, and reactive to light. Right eye exhibits no chemosis, no discharge, no exudate and no hordeolum. No foreign body present in the right eye. Left eye exhibits no chemosis, no discharge, no exudate and no hordeolum. No foreign body present in the left eye. Right conjunctiva is not injected. Right conjunctiva has no hemorrhage. Left conjunctiva is not injected. Left conjunctiva has no hemorrhage. No scleral icterus. Right eye exhibits normal extraocular motion and no nystagmus. Left eye exhibits normal extraocular motion and no nystagmus. Right pupil is round and reactive. Left pupil is round and reactive. Pupils are equal.  Neck: Trachea normal, normal range of motion and phonation normal. Neck supple. No tracheal tenderness and no muscular tenderness present. No neck rigidity. No tracheal deviation, no edema, no erythema and normal range of motion present. No thyroid mass and no thyromegaly present.  Cardiovascular: Normal rate, regular rhythm, S1 normal, S2 normal, normal heart sounds and intact distal pulses. PMI is not displaced. Exam reveals no gallop, no distant heart sounds and no friction rub.  No murmur heard. Pulses:      Radial pulses are 2+ on the right side, and 2+ on the left side.  Pulmonary/Chest: Effort normal and breath sounds normal. No accessory muscle usage or stridor. No respiratory distress. He has no decreased breath sounds. He has no wheezes. He has no rhonchi. He has no rales.   spoke full sentences without difficulty when not coughing.   sp02 room air decreased to 87% when coughing then returned to 97% once coughing ceased in exam room; nonproductive intermittent   Abdominal: Soft. Normal appearance. He exhibits no distension, no fluid wave and no ascites. There is no tenderness. There is no rigidity and no guarding.   Musculoskeletal: Normal range of motion. He exhibits no edema or tenderness.       Right shoulder: Normal.       Left shoulder: Normal.       Right elbow: Normal.      Left elbow: Normal.       Right hip: Normal.       Left hip: Normal.       Right knee: Normal.       Left knee: Normal.       Cervical back: Normal.       Thoracic back: Normal.       Lumbar back: Normal.       Right hand: Normal.       Left hand: Normal.  Lymphadenopathy:       Head (right side): No submental, no submandibular, no tonsillar, no preauricular, no posterior auricular and no occipital adenopathy present.       Head (left side): No submental, no submandibular, no tonsillar, no preauricular, no posterior auricular and no occipital adenopathy present.    He has no cervical adenopathy.       Right cervical: No superficial cervical, no deep cervical and no posterior cervical adenopathy present.      Left cervical: No superficial cervical, no deep cervical and no posterior cervical adenopathy present.  Neurological: He is alert and oriented to person, place, and time. He has normal strength. He displays no atrophy and no tremor. No cranial nerve deficit or sensory deficit. He exhibits normal muscle tone. He displays no seizure activity. Coordination and gait normal. GCS eye subscore is 4. GCS verbal subscore is 5. GCS motor subscore is 6.  On/off exam table; in/out of chair without difficulty; gait  sure and steady in hallway  Skin: Skin is warm, dry and intact. No abrasion, no bruising, no burn, no ecchymosis, no laceration, no lesion, no petechiae and no rash noted. He is not diaphoretic. No cyanosis or erythema. No pallor. Nails show no clubbing.  Psychiatric: He has a normal mood and affect. His speech is normal and behavior is normal. Judgment and thought content normal. Cognition and memory are normal.  Nursing note and vitals reviewed.          Assessment & Plan:  A-COPD with acute exacerbation, acute  maxillary sinusitis recurrent  P-Electronic Rx albuterol 109mg 1-2p po q4-6h prn protracted cough/wheezing/chest tightness #1 RF0.  Prednisone 129mtake 308mo with breakfast x 3 days #21 RF0 dispensed from [facility]. Doxycycline 100m66m BID x 10 days #20 Rf0 dispensed from [facility].  Failed azithromycin, flonase, nasal saline worsening symptoms  After coughing spo2 decreased to 89-87% and 2 minutes later back up to 97% in exam room.  Patient complains of neck/upper back pain has been taking his flexeril and it is helping him to relax and sleep.  Demonstrated MDI use with patient.  Rx Doxycycline Discussed to use sunscreen/protective clothing as increased risk of sunburn and most common side effect stomach upset take medication with food. Cough lozenges po q2h prn cough given 8 UD from clinic stock.  Prednisone Discussed possible side effects increased/decreased appetite, difficulty sleeping, increased blood sugar, increased blood pressure and heart rate therefore we are using lower dose short course because his blood sugars already raised with acute illness.  Continue aggressive rehydration.  Taking phenergan 12.5mg 76mq8h prn nausea and it is controlling symptoms tolerating po without difficulty.  Electronic Rx albuterol MDI 90mcg21m puffs po q4-6h prn protracted cough/wheeze #1 RF0 side effect increased heart rate. Bronchitis simple, community acquired, may have started as viral (probably respiratory syncytial, parainfluenza, influenza, or adenovirus), but now evidence of acute purulent bronchitis with resultant bronchial edema and mucus formation.  Viruses are the most common cause of bronchial inflammation in otherwise healthy adults with acute bronchitis.  The appearance of sputum is not predictive of whether a bacterial infection is present.  Purulent sputum is most often caused by viral infections.  There are a small portion of those caused by non-viral agents being Mycoplama pneumonia.  Microscopic examination  or C&S of sputum in the healthy adult with acute bronchitis is generally not helpful (usually negative or normal respiratory flora) other considerations being cough from upper respiratory tract infections, sinusitis or allergic syndromes (mild asthma or viral pneumonia).  Differential Diagnoses:  reactive airway disease (asthma, allergic aspergillosis (eosinophilia), chronic bronchitis, respiratory infection (sinusitis, common cold, pneumonia), congestive heart failure, reflux esophagitis, bronchogenic tumor, aspiration syndromes and/or exposure to pulmonary irritants/smoke.  I will order Doxycycline 100mg t72mimes a day for ten days for possible Mycoplamsa.  Without high fever, severe dyspnea, lack of physical findings or other risk factors, I will hold on a chest radiograph and CBC at this time.  I discussed that approximately 50% of patients with acute bronchitis have a cough that lasts up to three weeks, and 25% for over a month.  Tylenol 500mg on67m two tablets every four to six hours as needed for fever or myalgias.  No aspirin. Exitcare handout on COPD exacerbation acute  and inhaler use given to patient.  ER if hemopthysis, SOB, worst chest pain of life.   Patient instructed to follow up in 3 days as off Thursday unless he is feeling  worse than contact RN Hildred Alamin for appt time with me on Thursday.  ER/UC/PCM sooner if dyspnea/coughing up blood, protracted coughing not controlled with prednisone/albuterol.  Patient may follow up with RN [facility] Friday 16 Feb is symptoms improving for VS recheck including SPO2. Patient verbalized agreement and understanding of treatment plan.  P2:  hand washing and cover cough  Continue flonase 1 spray each nostril BID, saline 2 sprays each nostril q2h wa prn congestion has at home.  Start doxycycline 159m po BID x 10 days #20 RF0 dispensed from [facility].  Denied personal or family history of ENT cancer.  Shower BID especially prior to bed. No evidence of systemic bacterial  infection, non toxic and well hydrated.  I do not see where any further testing or imaging is necessary at this time.   I will suggest supportive care, rest, good hygiene and encourage the patient to take adequate fluids.  The patient is to return to clinic or EMERGENCY ROOM if symptoms worsen or change significantly.  Exitcare handout on sinusitis and sinus rinse given to patient.  Patient verbalized agreement and understanding of treatment plan and had no further questions at this time.   P2:  Hand washing and cover cough

## 2017-04-01 NOTE — Patient Instructions (Signed)
Sinus Rinse What is a sinus rinse? A sinus rinse is a simple home treatment that is used to rinse your sinuses with a sterile mixture of salt and water (saline solution). Sinuses are air-filled spaces in your skull behind the bones of your face and forehead that open into your nasal cavity. You will use the following:  Saline solution.  Neti pot or spray bottle. This releases the saline solution into your nose and through your sinuses. Neti pots and spray bottles can be purchased at [employer], a health food store, or online.  When would I do a sinus rinse? A sinus rinse can help to clear mucus, dirt, dust, or pollen from the nasal cavity. You may do a sinus rinse when you have a cold, a virus, nasal allergy symptoms, a sinus infection, or stuffiness in the nose or sinuses. If you are considering a sinus rinse:  Ask your child's health care provider before performing a sinus rinse on your child.  Do not do a sinus rinse if you have had ear or nasal surgery, ear infection, or blocked ears.  How do I do a sinus rinse?  Wash your hands.  Disinfect your device according to the directions provided and then dry it.  Use the solution that comes with your device or one that is sold separately in stores. Follow the mixing directions on the package.  Fill your device with the amount of saline solution as directed by the device instructions.  Stand over a sink and tilt your head sideways over the sink.  Place the spout of the device in your upper nostril (the one closer to the ceiling).  Gently pour or squeeze the saline solution into the nasal cavity. The liquid should drain to the lower nostril if you are not overly congested.  Gently blow your nose. Blowing too hard may cause ear pain.  Repeat in the other nostril.  Clean and rinse your device with clean water and then air-dry it. Are there risks of a sinus rinse? Sinus rinse is generally very safe and effective. However,  there are a few risks, which include:  A burning sensation in the sinuses. This may happen if you do not make the saline solution as directed. Make sure to follow all directions when making the saline solution.  Infection from contaminated water. This is rare, but possible.  Nasal irritation.  This information is not intended to replace advice given to you by your health care provider. Make sure you discuss any questions you have with your health care provider. Document Released: 09/01/2013 Document Revised: 01/02/2016 Document Reviewed: 06/22/2013 Elsevier Interactive Patient Education  2017 [employer] Sinusitis, Adult Sinusitis is soreness and inflammation of your sinuses. Sinuses are hollow spaces in the bones around your face. Your sinuses are located:  Around your eyes.  In the middle of your forehead.  Behind your nose.  In your cheekbones.  Your sinuses and nasal passages are lined with a stringy fluid (mucus). Mucus normally drains out of your sinuses. When your nasal tissues become inflamed or swollen, the mucus can become trapped or blocked so air cannot flow through your sinuses. This allows bacteria, viruses, and funguses to grow, which leads to infection. Sinusitis can develop quickly and last for 7?10 days (acute) or for more than 12 weeks (chronic). Sinusitis often develops after a cold. What are the causes? This condition is caused by anything that creates swelling in the sinuses or stops mucus from draining, including:  Allergies.  Asthma.  Bacterial or viral infection.  Abnormally shaped bones between the nasal passages.  Nasal growths that contain mucus (nasal polyps).  Narrow sinus openings.  Pollutants, such as chemicals or irritants in the air.  A foreign object stuck in the nose.  A fungal infection. This is rare.  What increases the risk? The following factors may make you more likely to develop this condition:  Having allergies or  asthma.  Having had a recent cold or respiratory tract infection.  Having structural deformities or blockages in your nose or sinuses.  Having a weak immune system.  Doing a lot of swimming or diving.  Overusing nasal sprays.  Smoking.  What are the signs or symptoms? The main symptoms of this condition are pain and a feeling of pressure around the affected sinuses. Other symptoms include:  Upper toothache.  Earache.  Headache.  Bad breath.  Decreased sense of smell and taste.  A cough that may get worse at night.  Fatigue.  Fever.  Thick drainage from your nose. The drainage is often green and it may contain pus (purulent).  Stuffy nose or congestion.  Postnasal drip. This is when extra mucus collects in the throat or back of the nose.  Swelling and warmth over the affected sinuses.  Sore throat.  Sensitivity to light.  How is this diagnosed? This condition is diagnosed based on symptoms, a medical history, and a physical exam. To find out if your condition is acute or chronic, your health care provider may:  Look in your nose for signs of nasal polyps.  Tap over the affected sinus to check for signs of infection.  View the inside of your sinuses using an imaging device that has a light attached (endoscope).  If your health care provider suspects that you have chronic sinusitis, you may also:  Be tested for allergies.  Have a sample of mucus taken from your nose (nasal culture) and checked for bacteria.  Have a mucus sample examined to see if your sinusitis is related to an allergy.  If your sinusitis does not respond to treatment and it lasts longer than 8 weeks, you may have an MRI or CT scan to check your sinuses. These scans also help to determine how severe your infection is. In rare cases, a bone biopsy may be done to rule out more serious types of fungal sinus disease. How is this treated? Treatment for sinusitis depends on the cause and  whether your condition is chronic or acute. If a virus is causing your sinusitis, your symptoms will go away on their own within 10 days. You may be given medicines to relieve your symptoms, including:  Topical nasal decongestants. They shrink swollen nasal passages and let mucus drain from your sinuses.  Antihistamines. These drugs block inflammation that is triggered by allergies. This can help to ease swelling in your nose and sinuses.  Topical nasal corticosteroids. These are nasal sprays that ease inflammation and swelling in your nose and sinuses.  Nasal saline washes. These rinses can help to get rid of thick mucus in your nose.  If your condition is caused by bacteria, you will be given an antibiotic medicine. If your condition is caused by a fungus, you will be given an antifungal medicine. Surgery may be needed to correct underlying conditions, such as narrow nasal passages. Surgery may also be needed to remove polyps. Follow these instructions at home: Medicines  Take, use, or apply over-the-counter and prescription medicines only as told by  your health care provider. These may include nasal sprays.  If you were prescribed an antibiotic medicine, take it as told by your health care provider. Do not stop taking the antibiotic even if you start to feel better. Hydrate and Humidify  Drink enough water to keep your urine clear or pale yellow. Staying hydrated will help to thin your mucus.  Use a cool mist humidifier to keep the humidity level in your home above 50%.  Inhale steam for 10-15 minutes, 3-4 times a day or as told by your health care provider. You can do this in the bathroom while a hot shower is running.  Limit your exposure to cool or dry air. Rest  Rest as much as possible.  Sleep with your head raised (elevated).  Make sure to get enough sleep each night. General instructions  Apply a warm, moist washcloth to your face 3-4 times a day or as told by your  health care provider. This will help with discomfort.  Wash your hands often with soap and water to reduce your exposure to viruses and other germs. If soap and water are not available, use hand sanitizer.  Do not smoke. Avoid being around people who are smoking (secondhand smoke).  Keep all follow-up visits as told by your health care provider. This is important. Contact a health care provider if:  You have a fever.  Your symptoms get worse.  Your symptoms do not improve within 10 days. Get help right away if:  You have a severe headache.  You have persistent vomiting.  You have pain or swelling around your face or eyes.  You have vision problems.  You develop confusion.  Your neck is stiff.  You have trouble breathing. This information is not intended to replace advice given to you by your health care provider. Make sure you discuss any questions you have with your health care provider. Document Released: 02/04/2005 Document Revised: 10/01/2015 Document Reviewed: 11/30/2014 Elsevier Interactive Patient Education  2018 [employer]. Chronic Obstructive Pulmonary Disease Exacerbation Chronic obstructive pulmonary disease (COPD) is a long-term (chronic) condition that affects the lungs. COPD is a general term that can be used to describe many different lung problems that cause lung swelling (inflammation) and limit airflow, including chronic bronchitis and emphysema. COPD exacerbations are episodes when breathing symptoms become much worse and require extra treatment. COPD exacerbations are usually caused by infections. Without treatment, COPD exacerbations can be severe and even life threatening. Frequent COPD exacerbations can cause further damage to the lungs. What are the causes? This condition may be caused by:  Respiratory infections, including viral and bacterial infections.  Exposure to smoke.  Exposure to air pollution, chemical fumes, or dust.  Things that give  you an allergic reaction (allergens).  Not taking your usual COPD medicines as directed.  Underlying medical problems, such as congestive heart failure or infections not involving the lungs.  In many cases, the cause (trigger) of this condition is not known. What increases the risk? The following factors may make you more likely to develop this condition:  Smoking cigarettes.  Old age.  Frequent prior COPD exacerbations.  What are the signs or symptoms? Symptoms of this condition include:  Increased coughing.  Increased production of mucus from your lungs (sputum).  Increased wheezing.  Increased shortness of breath.  Rapid or labored breathing.  Chest tightness.  Less energy than usual.  Sleep disruption from symptoms.  Confusion or increased sleepiness.  Often these symptoms happen or get worse  even with the use of medicines. How is this diagnosed? This condition is diagnosed based on:  Your medical history.  A physical exam.  You may also have tests, including:  A chest X-ray.  Blood tests.  Lung (pulmonary) function tests.  How is this treated? Treatment for this condition depends on the severity and cause of the symptoms. You may need to be admitted to a hospital for treatment. Some of the treatments commonly used to treat COPD exacerbations are:  Antibiotic medicines. These may be used for severe exacerbations caused by a lung infection, such as pneumonia.  Bronchodilators. These are inhaled medicines that expand the air passages and allow increased airflow.  Steroid medicines. These act to reduce inflammation in the airways. They may be given with an inhaler, taken by mouth, or given through an IV tube inserted into one of your veins.  Supplemental oxygen therapy.  Airway clearing techniques, such as noninvasive ventilation (NIV) and positive expiratory pressure (PEP). These provide respiratory support through a mask or other noninvasive device.  An example of this would be using a continuous positive airway pressure (CPAP) machine to improve delivery of oxygen into your lungs.  Follow these instructions at home: Medicines  Take over-the-counter and prescription medicines only as told by your health care provider. It is important to use correct technique with inhaled medicines.  If you were prescribed an antibiotic medicine or oral steroid, take it as told by your health care provider. Do not stop taking the medicine even if you start to feel better. Lifestyle  Eat a healthy diet.  Exercise regularly.  Get plenty of sleep.  Avoid exposure to all substances that irritate the airway, especially to tobacco smoke.  Wash your hands often with soap and water to reduce the risk of infection. If soap and water are not available, use hand sanitizer.  During flu season, avoid enclosed spaces that are crowded with people. General instructions  Drink enough fluid to keep your urine clear or pale yellow (unless you have a medical condition that requires fluid restriction).  Use a cool mist vaporizer. This humidifies the air and makes it easier for you to clear your chest when you cough.  If you have a home nebulizer and oxygen, continue to use them as told by your health care provider.  Keep all follow-up visits as told by your health care provider. This is important. How is this prevented?  Stay up-to-date on pneumococcal and influenza (flu) vaccines. A flu shot is recommended every year to help prevent exacerbations.  Do not use any products that contain nicotine or tobacco, such as cigarettes and e-cigarettes. Quitting smoking is very important in preventing COPD from getting worse and in preventing exacerbations from happening as often. If you need help quitting, ask your health care provider.  Follow all instructions for pulmonary rehabilitation after a recent exacerbation. This can help prevent future exacerbations.  Work with  your health care provider to develop and follow an action plan. This tells you what steps to take when you experience certain symptoms. Contact a health care provider if:  You have a worsening of your regular COPD symptoms. Get help right away if:  You have worsening shortness of breath, even when resting.  You have trouble talking.  You have severe chest pain.  You cough up blood.  You have a fever.  You have weakness, vomit repeatedly, or faint.  You feel confused.  You are not able to sleep because of your symptoms.  You have trouble doing daily activities. Summary  COPD exacerbations are episodes when breathing symptoms become much worse and require extra treatment above your normal treatment.  Exacerbations can be severe and even life threatening. Frequent COPD exacerbations can cause further damage to your lungs.  COPD exacerbations are usually triggered by infections such as the flu, colds, and even pneumonia.  Treatment for this condition depends on the severity and cause of the symptoms. You may need to be admitted to a hospital for treatment.  Quitting smoking is very important to prevent COPD from getting worse and to prevent exacerbations from happening as often. This information is not intended to replace advice given to you by your health care provider. Make sure you discuss any questions you have with your health care provider. Document Released: 12/02/2006 Document Revised: 03/11/2016 Document Reviewed: 03/11/2016 Elsevier Interactive Patient Education  2018 [employer]. How to Use a Metered Dose Inhaler A metered dose inhaler is a handheld device for taking medicine that must be breathed into the lungs (inhaled). The device can be used to deliver a variety of inhaled medicines, including:  Quick relief or rescue medicines, such as bronchodilators.  Controller medicines, such as corticosteroids.  The medicine is delivered by pushing down on a metal  canister to release a preset amount of spray and medicine. Each device contains the amount of medicine that is needed for a preset number of uses (inhalations). Your health care provider may recommend that you use a spacer with your inhaler to help you take the medicine more effectively. A spacer is a plastic tube with a mouthpiece on one end and an opening that connects to the inhaler on the other end. A spacer holds the medicine in a tube for a short time, which allows you to inhale more medicine. What are the risks? If you do not use your inhaler correctly, medicine might not reach your lungs to help you breathe. Inhaler medicine can cause side effects, such as:  Mouth or throat infection.  Cough.  Hoarseness.  Headache.  Nausea and vomiting.  Lung infection (pneumonia) in people who have a lung condition called COPD.  How to use a metered dose inhaler without a spacer 1. Remove the cap from the inhaler. 2. If you are using the inhaler for the first time, shake it for 5 seconds, turn it away from your face, then release 4 puffs into the air. This is called priming. 3. Shake the inhaler for 5 seconds. 4. Position the inhaler so the top of the canister faces up. 5. Put your index finger on the top of the medicine canister. Support the bottom of the inhaler with your thumb. 6. Breathe out normally and as completely as possible, away from the inhaler. 7. Either place the inhaler between your teeth and close your lips tightly around the mouthpiece, or hold the inhaler 1-2 inches (2.5-5 cm) away from your open mouth. Keep your tongue down out of the way. If you are unsure which technique to use, ask your health care provider. 8. Press the canister down with your index finger to release the medicine, then inhale deeply and slowly through your mouth (not your nose) until your lungs are completely filled. Inhaling should take 4-6 seconds. 9. Hold the medicine in your lungs for 5-10 seconds (10  seconds is best). This helps the medicine get into the small airways of your lungs. 10. With your lips in a tight circle (pursed), breathe out slowly. 11. Repeat steps 3-10  until you have taken the number of puffs that your health care provider directed. Wait about 1 minute between puffs or as directed. 12. Put the cap on the inhaler. 13. If you are using a steroid inhaler, rinse your mouth with water, gargle, and spit out the water. Do not swallow the water. How to use a metered dose inhaler with a spacer 1. Remove the cap from the inhaler. 2. If you are using the inhaler for the first time, shake it for 5 seconds, turn it away from your face, then release 4 puffs into the air. This is called priming. 3. Shake the inhaler for 5 seconds. 4. Place the open end of the spacer onto the inhaler mouthpiece. 5. Position the inhaler so the top of the canister faces up and the spacer mouthpiece faces you. 6. Put your index finger on the top of the medicine canister. Support the bottom of the inhaler and the spacer with your thumb. 7. Breathe out normally and as completely as possible, away from the spacer. 8. Place the spacer between your teeth and close your lips tightly around it. Keep your tongue down out of the way. 9. Press the canister down with your index finger to release the medicine, then inhale deeply and slowly through your mouth (not your nose) until your lungs are completely filled. Inhaling should take 4-6 seconds. 10. Hold the medicine in your lungs for 5-10 seconds (10 seconds is best). This helps the medicine get into the small airways of your lungs. 11. With your lips in a tight circle (pursed), breathe out slowly. 12. Repeat steps 3-11 until you have taken the number of puffs that your health care provider directed. Wait about 1 minute between puffs or as directed. 13. Remove the spacer from the inhaler and put the cap on the inhaler. 14. If you are using a steroid inhaler, rinse your  mouth with water, gargle, and spit out the water. Do not swallow the water. Follow these instructions at home:  Take your inhaled medicine only as told by your health care provider. Do not use the inhaler more than directed by your health care provider.  Keep all follow-up visits as told by your health care provider. This is important.  If your inhaler has a counter, you can check it to determine how full your inhaler is. If your inhaler does not have a counter, ask your health care provider when you will need to refill your inhaler and write the refill date on a calendar or on your inhaler canister. Note that you cannot know when an inhaler is empty by shaking it.  Follow directions on the package insert for care and cleaning of your inhaler and spacer. Contact a health care provider if:  Symptoms are only partially relieved with your inhaler.  You are having trouble using your inhaler.  You have an increase in phlegm.  You have headaches. Get help right away if:  You feel little or no relief after using your inhaler.  You have dizziness.  You have a fast heart rate.  You have chills or a fever.  You have night sweats.  There is blood in your phlegm. Summary  A metered dose inhaler is a handheld device for taking medicine that must be breathed into the lungs (inhaled).  The medicine is delivered by pushing down on a metal canister to release a preset amount of spray and medicine.  Each device contains the amount of medicine that is needed for  a preset number of uses (inhalations). This information is not intended to replace advice given to you by your health care provider. Make sure you discuss any questions you have with your health care provider. Document Released: 02/04/2005 Document Revised: 12/26/2015 Document Reviewed: 12/26/2015 Elsevier Interactive Patient Education  2017 [employer].

## 2017-04-04 ENCOUNTER — Ambulatory Visit: Payer: Self-pay | Admitting: *Deleted

## 2017-04-04 VITALS — BP 120/78 | HR 61 | Temp 97.1°F

## 2017-04-04 DIAGNOSIS — E118 Type 2 diabetes mellitus with unspecified complications: Secondary | ICD-10-CM

## 2017-04-04 DIAGNOSIS — I10 Essential (primary) hypertension: Secondary | ICD-10-CM

## 2017-04-04 DIAGNOSIS — Z794 Long term (current) use of insulin: Secondary | ICD-10-CM

## 2017-04-04 NOTE — Progress Notes (Signed)
Pt reports feeling better but still tired and a little lightheaded believeing it to be r/t being back to work first full day back. Still on prednisone, Flonase, nasal saline, albuterol twice a day on average, Doxy.  Maxillary sinus tenderness and pressure is much improved. Frontal sinus tenderness resolved. Reports cough, sore throat and chest congestion resolved.  Saw cardiology last week. With new studies will be switching anticoag when he finishes current supply from Brilinta to 81 ASA and Xarelto 2/2 studies showing better long term outcomes. Fasting labs ordered by Dr. Ellyn Hack planned for Tuesday next week.

## 2017-04-07 ENCOUNTER — Telehealth: Payer: Self-pay | Admitting: Cardiology

## 2017-04-07 NOTE — Telephone Encounter (Signed)
LM TO CALL BACK .Adonis Housekeeper

## 2017-04-07 NOTE — Telephone Encounter (Signed)
New message    Patient calling, wants to know if he needs to continue recording his blood pressures.

## 2017-04-08 ENCOUNTER — Ambulatory Visit: Payer: Self-pay | Admitting: *Deleted

## 2017-04-08 VITALS — BP 139/78 | HR 63

## 2017-04-08 DIAGNOSIS — I2510 Atherosclerotic heart disease of native coronary artery without angina pectoris: Secondary | ICD-10-CM

## 2017-04-08 DIAGNOSIS — Z9861 Coronary angioplasty status: Secondary | ICD-10-CM

## 2017-04-08 DIAGNOSIS — E1169 Type 2 diabetes mellitus with other specified complication: Secondary | ICD-10-CM

## 2017-04-08 DIAGNOSIS — E669 Obesity, unspecified: Secondary | ICD-10-CM

## 2017-04-08 NOTE — Telephone Encounter (Signed)
Left message to call back

## 2017-04-08 NOTE — Telephone Encounter (Signed)
Follow up    Patient returning call from yesterday

## 2017-04-09 LAB — COMPREHENSIVE METABOLIC PANEL
ALBUMIN: 4.5 g/dL (ref 3.6–4.8)
ALK PHOS: 77 IU/L (ref 39–117)
ALT: 23 IU/L (ref 0–44)
AST: 18 IU/L (ref 0–40)
Albumin/Globulin Ratio: 1.7 (ref 1.2–2.2)
BILIRUBIN TOTAL: 0.4 mg/dL (ref 0.0–1.2)
BUN / CREAT RATIO: 23 (ref 10–24)
BUN: 45 mg/dL — ABNORMAL HIGH (ref 8–27)
CHLORIDE: 95 mmol/L — AB (ref 96–106)
CO2: 25 mmol/L (ref 20–29)
CREATININE: 1.99 mg/dL — AB (ref 0.76–1.27)
Calcium: 10.1 mg/dL (ref 8.6–10.2)
GFR calc non Af Amer: 35 mL/min/{1.73_m2} — ABNORMAL LOW (ref >59)
GFR, EST AFRICAN AMERICAN: 40 mL/min/{1.73_m2} — AB (ref >59)
GLOBULIN, TOTAL: 2.6 g/dL (ref 1.5–4.5)
Glucose: 159 mg/dL — ABNORMAL HIGH (ref 65–99)
Potassium: 4.3 mmol/L (ref 3.5–5.2)
SODIUM: 140 mmol/L (ref 134–144)
TOTAL PROTEIN: 7.1 g/dL (ref 6.0–8.5)

## 2017-04-09 LAB — LIPID PANEL
CHOLESTEROL TOTAL: 198 mg/dL (ref 100–199)
Chol/HDL Ratio: 3.9 ratio (ref 0.0–5.0)
HDL: 51 mg/dL (ref >39)
LDL CALC: 102 mg/dL — AB (ref 0–99)
Triglycerides: 226 mg/dL — ABNORMAL HIGH (ref 0–149)
VLDL Cholesterol Cal: 45 mg/dL — ABNORMAL HIGH (ref 5–40)

## 2017-04-09 NOTE — Telephone Encounter (Signed)
3 messages have been left for patient, no return call received

## 2017-04-10 NOTE — Progress Notes (Signed)
Reviewed results with pt. Harold Kluver, NP present and reviewed above info regarding abx and steroid use, possible acute changes in kidney function. Other labs including glucose and lipids continuing improving trend over past few years. Results routed to ordering cardiologist and pt's nephrologist per pt request. Copy given to pt. No further questions or concerns.

## 2017-04-14 ENCOUNTER — Ambulatory Visit: Payer: Self-pay | Admitting: *Deleted

## 2017-04-14 VITALS — BP 135/79 | HR 76

## 2017-04-14 DIAGNOSIS — Z794 Long term (current) use of insulin: Secondary | ICD-10-CM

## 2017-04-14 DIAGNOSIS — I10 Essential (primary) hypertension: Secondary | ICD-10-CM

## 2017-04-14 DIAGNOSIS — E118 Type 2 diabetes mellitus with unspecified complications: Secondary | ICD-10-CM

## 2017-04-14 NOTE — Progress Notes (Signed)
Pt in for weekly BP and weight check. CBGs averaging 150-250 over the weekend. Currently 350. Has not taken any insulin yet today. Sees diabetes coordinator later this week.  Waiting to call Dr. Allison Quarry office back re: BP check frequency and reporting to office/MD as he has to call to request new Rx for Xarelto once he completes current Brilinta fill. Going to discuss both of these at same time as he has a difficult time reaching anyone in the office because they tend to do their pt call backs in the afternoon when he is unable to answer at work.

## 2017-04-28 ENCOUNTER — Ambulatory Visit: Payer: Self-pay | Admitting: *Deleted

## 2017-04-28 VITALS — BP 140/75 | HR 79 | Wt 327.0 lb

## 2017-04-28 DIAGNOSIS — I10 Essential (primary) hypertension: Secondary | ICD-10-CM

## 2017-04-28 DIAGNOSIS — E118 Type 2 diabetes mellitus with unspecified complications: Secondary | ICD-10-CM

## 2017-04-28 DIAGNOSIS — Z794 Long term (current) use of insulin: Principal | ICD-10-CM

## 2017-04-28 NOTE — Progress Notes (Signed)
Pt reports recurrence of chronic low back pain that is also causing pain down outside of both legs to feet. Feels similar to previous and has had to receive steroid injections for this in the past. Is planning to call back doctor and see if he has any treatment options outside of surgery at this point.  CBGs have been running 150-250 per [place] [facility]. Scheduled for A1c check end of this month. Pt is proud to report he has not missed work or used an [employer] day in 3 weeks stating he was using [employer]MLA time at least 1-2 times a week previously. Reports far fewer GI issues since DM2 has more under control since he started using [employer] meter. Encouraged him to continue this trend.   Weight unchanged since last check in clinic.  Has one month remaining on Brilinta. Calling in 2 weeks for Rx to switch over to Xarelto/ASA.

## 2017-05-02 ENCOUNTER — Ambulatory Visit: Payer: Self-pay | Admitting: *Deleted

## 2017-05-02 VITALS — BP 114/69 | HR 62

## 2017-05-02 DIAGNOSIS — E118 Type 2 diabetes mellitus with unspecified complications: Secondary | ICD-10-CM

## 2017-05-02 DIAGNOSIS — I10 Essential (primary) hypertension: Secondary | ICD-10-CM

## 2017-05-02 DIAGNOSIS — Z794 Long term (current) use of insulin: Principal | ICD-10-CM

## 2017-05-02 NOTE — Progress Notes (Signed)
Pt in for weekly check in. Reports CBGs have been running high this week. Rarely under 200. Reports stress eating causing increase. Also c/o increase in low back pain from last week. Wants to try at home interventions first before seeing back specialist since he knows he will be told to do those things before anything more invasive is attempted. Going to try to find a new pair of tennis shoes soon as he only has slip-ons with little to no support for ease of putting on. Discussed treads and support to look for when shopping to help back and hip pain. Pt verbalizes understanding and agreement. Pt declines weigh in today, sts will do Monday at next check in.

## 2017-05-05 ENCOUNTER — Ambulatory Visit: Payer: Self-pay | Admitting: *Deleted

## 2017-05-05 VITALS — BP 138/78 | HR 63 | Wt 329.0 lb

## 2017-05-05 DIAGNOSIS — Z794 Long term (current) use of insulin: Principal | ICD-10-CM

## 2017-05-05 DIAGNOSIS — E118 Type 2 diabetes mellitus with unspecified complications: Secondary | ICD-10-CM

## 2017-05-05 DIAGNOSIS — I10 Essential (primary) hypertension: Secondary | ICD-10-CM

## 2017-05-05 NOTE — Progress Notes (Signed)
CBGs slightly improved over the weekend. Currently 151. Trying to cut back on coffee that he believes is increasing cbg in the AM due to sweetener and creamer. Also endorses continued "stress-eating" which "he knows he needs to cut out and is causing the weight gain."

## 2017-05-12 ENCOUNTER — Ambulatory Visit: Payer: Self-pay | Admitting: *Deleted

## 2017-05-12 VITALS — BP 131/73 | HR 75

## 2017-05-12 DIAGNOSIS — E118 Type 2 diabetes mellitus with unspecified complications: Secondary | ICD-10-CM

## 2017-05-12 DIAGNOSIS — Z794 Long term (current) use of insulin: Principal | ICD-10-CM

## 2017-05-12 DIAGNOSIS — I10 Essential (primary) hypertension: Secondary | ICD-10-CM

## 2017-05-12 NOTE — Progress Notes (Signed)
Pt reports CBGs high over weekend, 200-300. 190's this am. He had a reading of 72 on Friday and stated he was asymptomatic which shows improvement in his blood sugar controls as previously if he dropped below 90 he would have hypoglycemic sx. He stated he felt fine but ate crackers anyway to prevent dropping lower. Discussed with pt the need to work on removing previous mindset of his "normal" vs actual normal. 72 does not need to be corrected when he is asymptomatic. Try to continue monitor sx and readings and provide correction if it drops closer to 60 or he becomes symptomatic. Pt reports he will try to be more cognizant of this. PCP appt this week with lab draw tomorrow with A1c. He is hopeful that it will be improved since using [employer] meter. 10.7 in July 2018, believes 8.9 in late fall 2018. Will bring results to next check in on Friday.

## 2017-05-19 ENCOUNTER — Ambulatory Visit: Payer: Self-pay | Admitting: *Deleted

## 2017-05-19 VITALS — BP 113/68 | HR 68 | Wt 326.0 lb

## 2017-05-19 DIAGNOSIS — Z794 Long term (current) use of insulin: Principal | ICD-10-CM

## 2017-05-19 DIAGNOSIS — I10 Essential (primary) hypertension: Secondary | ICD-10-CM

## 2017-05-19 DIAGNOSIS — E118 Type 2 diabetes mellitus with unspecified complications: Secondary | ICD-10-CM

## 2017-05-19 NOTE — Progress Notes (Signed)
CBGs well controlled over weekend. Saw DM2 manager at end of last week. March month average ran on [place] meter, showing avg of 182. Also had meds adjusted-Lantus up to 85 units BID, Novolog up to 40units 4x daily with meals.

## 2017-06-03 ENCOUNTER — Ambulatory Visit: Payer: Self-pay | Admitting: *Deleted

## 2017-06-03 VITALS — BP 138/75 | HR 74

## 2017-06-13 ENCOUNTER — Ambulatory Visit: Payer: Self-pay | Admitting: *Deleted

## 2017-06-13 VITALS — BP 152/78 | Wt 325.0 lb

## 2017-06-13 DIAGNOSIS — Z794 Long term (current) use of insulin: Secondary | ICD-10-CM

## 2017-06-13 DIAGNOSIS — I10 Essential (primary) hypertension: Secondary | ICD-10-CM

## 2017-06-13 DIAGNOSIS — E118 Type 2 diabetes mellitus with unspecified complications: Secondary | ICD-10-CM

## 2017-06-13 NOTE — Progress Notes (Signed)
Weekly BP and Wt check. Reports Wt was 326# at MD office 3 days ago. Stable today in clinic. BP elevated. Pt on Prednisone taper for joint swelling and pain. Day 3 of 5. PCP treating as gout flare, but referring to rheumatologist for further work up. Pt reports CBGs drastically increased over past few days due to steroids, averaging ~350. Has had one reading "HI", >500 on [place] meter. Denies hyperglycemia sx. Doubled up on Novolog this am. 40 units, rechecked, then additional 40units brought down to ~250. Has A1c check and DM2 provider/coordinator visit next week.

## 2017-06-16 ENCOUNTER — Ambulatory Visit: Payer: Self-pay | Admitting: *Deleted

## 2017-06-16 VITALS — BP 134/78 | Wt 326.0 lb

## 2017-06-16 DIAGNOSIS — I10 Essential (primary) hypertension: Secondary | ICD-10-CM

## 2017-06-16 DIAGNOSIS — E118 Type 2 diabetes mellitus with unspecified complications: Secondary | ICD-10-CM

## 2017-06-16 DIAGNOSIS — Z794 Long term (current) use of insulin: Secondary | ICD-10-CM

## 2017-06-16 NOTE — Progress Notes (Signed)
Pt initially very HTN. Had walked very fast in from parking lot, running late this morning. Rested 1-2 min before BP taken manually--190-84. ShOB as per usual, but no other sx. Rested another 5 minutes--134/78. Returned to [facility]. Breathing returned to pt's baseline as well.   Pt reports glucose "way up and way down all weekend." Denies any symptomatic hypo- or hyperglycemic episodes. Finished prednisone taper yesterday. Attributes glucose instability to this still.   Reports he called one of his doctors last week to see if there were any studies he was eligible for. Sts extra cash and doesn't mind being tested for things like that, so he seeks them out as this doctor does about 3 studies a year. Sts he is eligible for one studying triglycerides and had to stop taking Fenofibric Acid last week for it.

## 2017-06-19 ENCOUNTER — Other Ambulatory Visit: Payer: Self-pay

## 2017-06-19 ENCOUNTER — Telehealth: Payer: Self-pay | Admitting: Cardiology

## 2017-06-19 MED ORDER — RIVAROXABAN 2.5 MG PO TABS: 2.5000 mg | ORAL_TABLET | Freq: Two times a day (BID) | ORAL | 3 refills | Status: DC

## 2017-06-19 MED ORDER — ASPIRIN EC 81 MG PO TBEC: 81.0000 mg | DELAYED_RELEASE_TABLET | Freq: Every day | ORAL | 3 refills | Status: DC

## 2017-06-19 NOTE — Telephone Encounter (Signed)
Left message to call back.

## 2017-06-19 NOTE — Telephone Encounter (Signed)
Per Dr. Allison Quarry note on 2/8, pt is to complete Brilinta order then start on Xarleto 2.5 mg BID and ASA 81 mg. Pt called to advise he has completed order. New prescription for Harold [name] an ASA has been sent to pharmacy. Pt verbalized understanding of new orders.

## 2017-06-19 NOTE — Telephone Encounter (Signed)
New message  Pt verbalzied that he is calling for RN  He said that he is finished with his medication ( I did not see the name listed on his med list)  Pt stated that he is ready to start the next medication as he was told previously

## 2017-06-23 ENCOUNTER — Ambulatory Visit: Payer: Self-pay | Admitting: *Deleted

## 2017-06-23 VITALS — BP 150/79 | HR 59 | Wt 329.0 lb

## 2017-06-23 DIAGNOSIS — E118 Type 2 diabetes mellitus with unspecified complications: Secondary | ICD-10-CM

## 2017-06-23 DIAGNOSIS — I10 Essential (primary) hypertension: Secondary | ICD-10-CM

## 2017-06-23 DIAGNOSIS — Z794 Long term (current) use of insulin: Secondary | ICD-10-CM

## 2017-06-23 NOTE — Progress Notes (Signed)
Pt reports starting a study through his primary doctor's office for cholesterol and has been instructed to stop taking Fenofibrate acid. Stopped approx 2 weeks ago. Will plan to resume at end of study. Date of this is unknown at this time, but he assumes later this year.  Has switched to Xarelto and ASA daily from [place] based on recent studies showing better long term survivability and reduced negative outcomes per cardiology.   Has stopped Invokana per Endocrinology as kidney function on last blood work as trended upward and CBGs have been better controlled over past few months since beginning to use [employer] system and monitoring CBGs more often. CBGs have been high over past 2-3 weeks weeks due to Prednisone taper and now 76m Prednisone daily. A1c performed at Endocrinology was much improved though. July 2018 was 8.5, he jumped to 9.4 in January 2019 as he was not checking CBGs or following much of a diet plan. Last week has now dropped to 7.7.   Was seen by Rheumatology recently, referred by back dr/ortho. Was Dx'd with RA/OA. Started on 115mPrednisone for at least the next month, possibly longer. Also with high uric acid and gout flare in foot which he was told Prednisone should help treat. Feeling better overall, less pain. Happy to have a diagnosis and know that back, hip, leg and foot pain is not all coming from only his back problems as he has been told for so long, and because not many options left for back pain treatment at this time 2/2 wt, intolerance of attempted surgical procedures. Hopeful that treatment can be more targeted now and reduced pain can continue.   Weight increase today. 4# in past 3 days. Reports he has been taking his Lasix as prescribed, but only took it a short bit ago today so it has not really started working fully yet. ShOB at baseline. No cough noted. BLE edema at baseline, minimal.

## 2017-06-26 ENCOUNTER — Telehealth: Payer: Self-pay | Admitting: Cardiology

## 2017-06-26 NOTE — Telephone Encounter (Signed)
Left message to call back

## 2017-06-26 NOTE — Telephone Encounter (Signed)
New Message   Patient is calling to see if there is discount savings card available for Xarelto. Please call to discuss.

## 2017-06-30 NOTE — Telephone Encounter (Signed)
LM that we have no xarelto savings/co-pay cards. Sent link to [employer] (xarelto) patient support website

## 2017-07-03 ENCOUNTER — Ambulatory Visit: Payer: Self-pay | Admitting: Registered Nurse

## 2017-07-03 VITALS — BP 150/80 | HR 59 | Temp 96.7°F

## 2017-07-03 DIAGNOSIS — H6983 Other specified disorders of Eustachian tube, bilateral: Secondary | ICD-10-CM

## 2017-07-03 DIAGNOSIS — J019 Acute sinusitis, unspecified: Secondary | ICD-10-CM

## 2017-07-03 DIAGNOSIS — B9789 Other viral agents as the cause of diseases classified elsewhere: Principal | ICD-10-CM

## 2017-07-03 DIAGNOSIS — J069 Acute upper respiratory infection, unspecified: Secondary | ICD-10-CM

## 2017-07-03 MED ORDER — ALBUTEROL SULFATE HFA 108 (90 BASE) MCG/ACT IN AERS: 1.0000 | INHALATION_SPRAY | RESPIRATORY_TRACT | 0 refills | Status: DC | PRN

## 2017-07-03 MED ORDER — FLUTICASONE PROPIONATE 50 MCG/ACT NA SUSP: 1.0000 | Freq: Two times a day (BID) | NASAL | 1 refills | Status: DC

## 2017-07-03 MED ORDER — BENZONATATE 200 MG PO CAPS: 200.0000 mg | ORAL_CAPSULE | Freq: Two times a day (BID) | ORAL | 0 refills | Status: AC | PRN

## 2017-07-03 MED ORDER — CETIRIZINE HCL 10 MG PO TABS: 10.0000 mg | ORAL_TABLET | Freq: Every day | ORAL | 0 refills | Status: DC

## 2017-07-03 MED ORDER — SALINE SPRAY 0.65 % NA SOLN: 2.0000 | NASAL | 0 refills | Status: DC

## 2017-07-03 NOTE — Progress Notes (Signed)
Subjective:    Patient ID: Harold [name], male    DOB: 1955-04-17, 62 y.o.   MRN: [id number]  61 y/o Caucasian established male pt c/o sinus pain/pressure, upper teeth/gum pain, productive cough, sore throat, bil otalgia, fatigue x1 week. Started Zpak, Flonase, Promethazine cough syrup on Monday (4 days ago). Feeling slightly better but not much. Now with R eustachian tube discomfort. Due to finish Zpak tomorrow. On prednisone taper for arthritis currently 10m.  Blood sugars have been in 300s due to oral steroid taper for arthritis.   + sick contacts at work with similar symptoms  Last seen 04/01/17 for COPD exacerbation/sinusitis by me   Has nasal saline and flonase at home ran out of albuterol and tessalon pearles from last illness  Stated they all helped last time     Review of Systems  Constitutional: Positive for fatigue. Negative for activity change, appetite change, chills, diaphoresis, fever and unexpected weight change.  HENT: Positive for congestion, ear pain, postnasal drip, sinus pressure, sinus pain and sore throat. Negative for dental problem, drooling, ear discharge, facial swelling, hearing loss, mouth sores, nosebleeds, rhinorrhea, sneezing, tinnitus, trouble swallowing and voice change.   Eyes: Negative for photophobia, pain, discharge, redness, itching and visual disturbance.  Respiratory: Positive for cough. Negative for choking, chest tightness, shortness of breath, wheezing and stridor.   Cardiovascular: Negative for chest pain, palpitations and leg swelling.  Gastrointestinal: Negative for abdominal distention, abdominal pain, blood in stool, constipation, diarrhea, nausea and vomiting.  Endocrine: Negative for cold intolerance and heat intolerance.  Genitourinary: Negative for dysuria.  Musculoskeletal: Positive for myalgias. Negative for arthralgias, back pain, gait problem, joint swelling, neck pain and neck stiffness.  Skin: Negative for color change, pallor,  rash and wound.  Allergic/Immunologic: Positive for environmental allergies and immunocompromised state. Negative for food allergies.  Neurological: Negative for dizziness, tremors, seizures, syncope, facial asymmetry, speech difficulty, weakness, light-headedness, numbness and headaches.  Hematological: Negative for adenopathy. Does not bruise/bleed easily.  Psychiatric/Behavioral: Negative for agitation, confusion and sleep disturbance.       Objective:   Physical Exam  Constitutional: He is oriented to person, place, and time. Vital signs are normal. He appears well-developed and well-nourished. He is active and cooperative.  Non-toxic appearance. He does not have a sickly appearance. He appears ill. No distress.  HENT:  Head: Normocephalic and atraumatic.  Right Ear: Hearing, external ear and ear canal normal. A middle ear effusion is present.  Left Ear: Hearing, external ear and ear canal normal. A middle ear effusion is present.  Nose: Mucosal edema and rhinorrhea present. No nose lacerations, sinus tenderness, nasal deformity, septal deviation or nasal septal hematoma. No epistaxis.  No foreign bodies. Right sinus exhibits maxillary sinus tenderness and frontal sinus tenderness. Left sinus exhibits maxillary sinus tenderness and frontal sinus tenderness.  Mouth/Throat: Uvula is midline and mucous membranes are normal. Mucous membranes are not pale, not dry and not cyanotic. He does not have dentures. No oral lesions. No trismus in the jaw. Normal dentition. No dental abscesses, uvula swelling, lacerations or dental caries. Posterior oropharyngeal edema and posterior oropharyngeal erythema present. No oropharyngeal exudate or tonsillar abscesses.  malampati IV airway; cobblestoning posterior pharynx; bilateral allergic shiners and lower eyelids swollen 2+/4 bilaterally; bilateral TMs air fluid level clear; clear discharge bilateral nares congestion and frequent nose clearing in exam room   Eyes: Pupils are equal, round, and reactive to light. Conjunctivae, EOM and lids are normal. Right eye exhibits no chemosis, no discharge,  no exudate and no hordeolum. No foreign body present in the right eye. Left eye exhibits no chemosis, no discharge, no exudate and no hordeolum. No foreign body present in the left eye. Right conjunctiva is not injected. Right conjunctiva has no hemorrhage. Left conjunctiva is not injected. Left conjunctiva has no hemorrhage. No scleral icterus. Right eye exhibits normal extraocular motion and no nystagmus. Left eye exhibits normal extraocular motion and no nystagmus. Right pupil is round and reactive. Left pupil is round and reactive. Pupils are equal.  Neck: Trachea normal, normal range of motion and phonation normal. Neck supple. No tracheal tenderness, no spinous process tenderness and no muscular tenderness present. No neck rigidity. No tracheal deviation, no edema, no erythema and normal range of motion present. No thyroid mass and no thyromegaly present.  Cardiovascular: Normal rate, regular rhythm, S1 normal, S2 normal, normal heart sounds and intact distal pulses. PMI is not displaced. Exam reveals no gallop, no distant heart sounds and no friction rub.  No murmur heard. Pulses:      Radial pulses are 2+ on the right side, and 2+ on the left side.  Pulmonary/Chest: Effort normal and breath sounds normal. No stridor. No respiratory distress. He has no decreased breath sounds. He has no wheezes. He has no rhonchi. He has no rales.  Nonproductive cough in exam room observed; spoke full sentences without difficulty  Abdominal: Soft. Normal appearance. He exhibits no distension and no fluid wave. There is no rigidity and no guarding.  Musculoskeletal: Normal range of motion. He exhibits no edema or tenderness.       Right shoulder: Normal.       Left shoulder: Normal.       Right elbow: Normal.      Left elbow: Normal.       Right hip: Normal.       Left hip:  Normal.       Right knee: Normal.       Left knee: Normal.       Cervical back: Normal.       Thoracic back: Normal.       Lumbar back: Normal.       Right hand: Normal.       Left hand: Normal.  Lymphadenopathy:       Head (right side): No submental, no submandibular, no tonsillar, no preauricular, no posterior auricular and no occipital adenopathy present.       Head (left side): No submental, no submandibular, no tonsillar, no preauricular, no posterior auricular and no occipital adenopathy present.    He has no cervical adenopathy.       Right cervical: No superficial cervical, no deep cervical and no posterior cervical adenopathy present.      Left cervical: No superficial cervical, no deep cervical and no posterior cervical adenopathy present.  Neurological: He is alert and oriented to person, place, and time. He has normal strength. He is not disoriented. He displays no atrophy and no tremor. No cranial nerve deficit or sensory deficit. He exhibits normal muscle tone. He displays no seizure activity. Coordination and gait normal. GCS eye subscore is 4. GCS verbal subscore is 5. GCS motor subscore is 6.  On/off exam table; in/out of chair without difficulty; gait sure and steady in hallway; sipping diet coke in exam room  Skin: Skin is warm, dry and intact. Capillary refill takes less than 2 seconds. No abrasion, no bruising, no burn, no ecchymosis, no laceration, no lesion, no petechiae and no  rash noted. He is not diaphoretic. No cyanosis or erythema. No pallor. Nails show no clubbing.  Psychiatric: He has a normal mood and affect. His speech is normal and behavior is normal. Judgment and thought content normal. He is not actively hallucinating. Cognition and memory are normal. He is attentive.  Nursing note and vitals reviewed.         Assessment & Plan:  A-COPD with acute exacerbation, acute maxillary sinusitis recurrent  P-Electronic Rx albuterol 98mg 1-2p po q4-6h prn  protracted cough/wheezing/chest tightness #1 RF0 to his pharmacy of choice.  Finish prednisone taper that was started for arthritis. Finish azithromycin last dose tomorrow, continue flonase 1 spray each nostril BID #1 RF1 refilled electronic Rx to his pharmacy of choice, nasal saline 2 sprays each nostril q2hwa given 1 bottle from clinic stock discussed using more frequently as still very nasally congested and upper teeth pain.  Demonstrated MDI use with patient. Cough lozenges po q2h prn cough given 8 UD from clinic stock.  Prednisone Discussed possible side effects increased/decreased appetite, difficulty sleeping, increased blood sugar, increased blood pressure and heart rate therefore we are using lower dose short course because his blood sugars already raised with acute illness.  Continue aggressive rehydration.  Taking phenergan 12.576mpo q8h prn nausea and it is controlling symptoms tolerating po without difficulty.  Electronic Rx albuterol MDI 9057m1-2 puffs po q4-6h prn protracted cough/wheeze #1 RF0 side effect increased heart rate.  Tessalon pearles 2000m74m TID prn cough #30 RF0 electronic Rx to his pharmacy of choice.   Bronchitis simple, community acquired, may have started as viral (probably respiratory syncytial, parainfluenza, influenza, or adenovirus), but now evidence of acute purulent bronchitis with resultant bronchial edema and mucus formation.  Viruses are the most common cause of bronchial inflammation in otherwise healthy adults with acute bronchitis.  The appearance of sputum is not predictive of whether a bacterial infection is present.  Purulent sputum is most often caused by viral infections.  There are a small portion of those caused by non-viral agents being Mycoplama pneumonia.  Microscopic examination or C&S of sputum in the healthy adult with acute bronchitis is generally not helpful (usually negative or normal respiratory flora) other considerations being cough from upper  respiratory tract infections, sinusitis or allergic syndromes (mild asthma or viral pneumonia).  Differential Diagnoses:  reactive airway disease (asthma, allergic aspergillosis (eosinophilia), chronic bronchitis, respiratory infection (sinusitis, common cold, pneumonia), congestive heart failure, reflux esophagitis, bronchogenic tumor, aspiration syndromes and/or exposure to pulmonary irritants/smoke.  I will order Doxycycline 100mg84m times a day for ten days for possible Mycoplamsa.  Without high fever, severe dyspnea, lack of physical findings or other risk factors, I will hold on a chest radiograph and CBC at this time.  I discussed that approximately 50% of patients with acute bronchitis have a cough that lasts up to three weeks, and 25% for over a month.  Tylenol 500mg 84mto two tablets every four to six hours as needed for fever or myalgias.  No aspirin. Exitcare handout on diabetes and sick day care, viral URI with cough  and inhaler use.  ER if hemopthysis, SOB, worst chest pain of life.    ER/UC/PCM sooner if dyspnea/coughing up blood, protracted coughing not controlled with prednisone/albuterol.  Patient verbalized agreement and understanding of treatment plan.  P2:  hand washing and cover cough  Finish azithromycin Feb 2019 required doxycycline to clear sinusitis/COPD exacerbation.  Increase use of nasal saline to every 2h wa.  Continue flonase 1 spray each nostril BID, saline 2 sprays each nostril q2h wa prn congestion has at home.   Denied personal or family history of ENT cancer.  Shower BID especially prior to bed. No evidence of systemic bacterial infection, non toxic and well hydrated.  I do not see where any further testing or imaging is necessary at this time.   I will suggest supportive care, rest, good hygiene and encourage the patient to take adequate fluids.  The patient is to return to clinic or EMERGENCY ROOM if symptoms worsen or change significantly.  Exitcare handout on sinusitis  and sinus rinse.  Patient verbalized agreement and understanding of treatment plan and had no further questions at this time.   P2:  Hand washing and cover cough  Supportive treatment.   No evidence of invasive bacterial infection, non toxic and well hydrated.  This is most likely self limiting viral infection.  I do not see where any further testing or imaging is necessary at this time.   I will suggest supportive care, rest, good hygiene and encourage the patient to take adequate fluids.  The patient is to return to clinic or EMERGENCY ROOM if symptoms worsen or change significantly e.g. ear pain, fever, purulent discharge from ears or bleeding.  Exitcare handout on eustachian tube dysfunction.  Patient verbalized agreement and understanding of treatment plan.

## 2017-07-03 NOTE — Patient Instructions (Addendum)
How to Use a Metered Dose Inhaler A metered dose inhaler is a handheld device for taking medicine that must be breathed into the lungs (inhaled). The device can be used to deliver a variety of inhaled medicines, including:  Quick relief or rescue medicines, such as bronchodilators.  Controller medicines, such as corticosteroids.  The medicine is delivered by pushing down on a metal canister to release a preset amount of spray and medicine. Each device contains the amount of medicine that is needed for a preset number of uses (inhalations). Your health care provider may recommend that you use a spacer with your inhaler to help you take the medicine more effectively. A spacer is a plastic tube with a mouthpiece on one end and an opening that connects to the inhaler on the other end. A spacer holds the medicine in a tube for a short time, which allows you to inhale more medicine. What are the risks? If you do not use your inhaler correctly, medicine might not reach your lungs to help you breathe. Inhaler medicine can cause side effects, such as:  Mouth or throat infection.  Cough.  Hoarseness.  Headache.  Nausea and vomiting.  Lung infection (pneumonia) in people who have a lung condition called COPD.  How to use a metered dose inhaler without a spacer 1. Remove the cap from the inhaler. 2. If you are using the inhaler for the first time, shake it for 5 seconds, turn it away from your face, then release 4 puffs into the air. This is called priming. 3. Shake the inhaler for 5 seconds. 4. Position the inhaler so the top of the canister faces up. 5. Put your index finger on the top of the medicine canister. Support the bottom of the inhaler with your thumb. 6. Breathe out normally and as completely as possible, away from the inhaler. 7. Either place the inhaler between your teeth and close your lips tightly around the mouthpiece, or hold the inhaler 1-2 inches (2.5-5 cm) away from your open  mouth. Keep your tongue down out of the way. If you are unsure which technique to use, ask your health care provider. 8. Press the canister down with your index finger to release the medicine, then inhale deeply and slowly through your mouth (not your nose) until your lungs are completely filled. Inhaling should take 4-6 seconds. 9. Hold the medicine in your lungs for 5-10 seconds (10 seconds is best). This helps the medicine get into the small airways of your lungs. 10. With your lips in a tight circle (pursed), breathe out slowly. 11. Repeat steps 3-10 until you have taken the number of puffs that your health care provider directed. Wait about 1 minute between puffs or as directed. 12. Put the cap on the inhaler. 13. If you are using a steroid inhaler, rinse your mouth with water, gargle, and spit out the water. Do not swallow the water. How to use a metered dose inhaler with a spacer 1. Remove the cap from the inhaler. 2. If you are using the inhaler for the first time, shake it for 5 seconds, turn it away from your face, then release 4 puffs into the air. This is called priming. 3. Shake the inhaler for 5 seconds. 4. Place the open end of the spacer onto the inhaler mouthpiece. 5. Position the inhaler so the top of the canister faces up and the spacer mouthpiece faces you. 6. Put your index finger on the top of the medicine canister.  Support the bottom of the inhaler and the spacer with your thumb. 7. Breathe out normally and as completely as possible, away from the spacer. 8. Place the spacer between your teeth and close your lips tightly around it. Keep your tongue down out of the way. 9. Press the canister down with your index finger to release the medicine, then inhale deeply and slowly through your mouth (not your nose) until your lungs are completely filled. Inhaling should take 4-6 seconds. 10. Hold the medicine in your lungs for 5-10 seconds (10 seconds is best). This helps the medicine  get into the small airways of your lungs. 11. With your lips in a tight circle (pursed), breathe out slowly. 12. Repeat steps 3-11 until you have taken the number of puffs that your health care provider directed. Wait about 1 minute between puffs or as directed. 13. Remove the spacer from the inhaler and put the cap on the inhaler. 14. If you are using a steroid inhaler, rinse your mouth with water, gargle, and spit out the water. Do not swallow the water. Follow these instructions at home:  Take your inhaled medicine only as told by your health care provider. Do not use the inhaler more than directed by your health care provider.  Keep all follow-up visits as told by your health care provider. This is important.  If your inhaler has a counter, you can check it to determine how full your inhaler is. If your inhaler does not have a counter, ask your health care provider when you will need to refill your inhaler and write the refill date on a calendar or on your inhaler canister. Note that you cannot know when an inhaler is empty by shaking it.  Follow directions on the package insert for care and cleaning of your inhaler and spacer. Contact a health care provider if:  Symptoms are only partially relieved with your inhaler.  You are having trouble using your inhaler.  You have an increase in phlegm.  You have headaches. Get help right away if:  You feel little or no relief after using your inhaler.  You have dizziness.  You have a fast heart rate.  You have chills or a fever.  You have night sweats.  There is blood in your phlegm. Summary  A metered dose inhaler is a handheld device for taking medicine that must be breathed into the lungs (inhaled).  The medicine is delivered by pushing down on a metal canister to release a preset amount of spray and medicine.  Each device contains the amount of medicine that is needed for a preset number of uses (inhalations). This  information is not intended to replace advice given to you by your health care provider. Make sure you discuss any questions you have with your health care provider. Document Released: 02/04/2005 Document Revised: 12/26/2015 Document Reviewed: 12/26/2015 Elsevier Interactive Patient Education  2017 [employer]. Diabetes Mellitus and Sick Day Management Blood sugar (glucose) can be difficult to control when you are sick. Common illnesses that can cause problems for people with diabetes (diabetes mellitus) include colds, fever, flu (influenza), nausea, vomiting, and diarrhea. These illnesses can cause stress and loss of body fluids (dehydration), and those issues can cause blood glucose levels to increase. Because of this, it is very important to take your insulin and diabetes medicines and eat some form of carbohydrate when you are sick. You should make a plan for days when you are sick (sick day plan) as part  of your diabetes management plan. You and your health care provider should make this plan in advance. The following guidelines are intended to help you manage an illness that lasts for about 24 hours or less. Your health care provider may also give you more specific instructions. What do I need to do to manage my blood glucose?  Check your blood glucose every 2-4 hours, or as often as told by your health care provider.  Know your sick day treatment goals. Your target blood glucose levels may be different when you are sick.  If you use insulin, take your usual dose. ? If your blood glucose continues to be too high, you may need to take an additional insulin dose as told by your health care provider.  If you use oral diabetes medicine, you may need to stop taking it if you are not able to eat or drink normally. Ask your health care provider about whether you need to stop taking these medicines while you are sick.  If you use injectable hormone medicines other than insulin to control your  diabetes, ask your health care provider about whether you need to stop taking these medicines while you are sick. What else can I do to manage my diabetes when I am sick? Check your ketones  If you have type 1 diabetes, check your urine ketones every 4 hours.  If you have type 2 diabetes, check your urine ketones as often as told by your health care provider. Drink fluids  Drink enough fluid to keep your urine clear or pale yellow. This is especially important if you have a fever, vomiting, or diarrhea. Those symptoms can lead to dehydration.  Follow any instructions from your health care provider about beverages to avoid. ? Do not drink alcohol, caffeine, or drinks that contain a lot of sugar. Take medicines as directed  Take-over-the-counter and prescription medicines only as told by your health care provider.  Check medicine labels for added sugars. Some medicines may contain sugar or types of sugars that can raise your blood glucose level. What foods can I eat when I am sick? You need to eat some form of carbohydrates when you are sick. You should eat 45-50 grams (45-50 g) of carbohydrates every 3-4 hours until you feel better. All of the food choices below contain about 15 g of carbohydrates. Plan ahead and keep some of these foods around so you have them if you get sick.  4-6 oz (120-177 mL) carbonated beverage that contains sugar, such as regular (not diet) soda. You may be able to drink carbonated beverages more easily if you open the beverage and let it sit at room temperature for a few minutes before drinking.   of a twin frozen ice pop.  4 oz (120 g) regular gelatin.  4 oz (120 mL) fruit juice.  4 oz (120 g) ice cream or frozen yogurt.  2 oz (60 g) sherbet.  8 oz (240 mL) clear broth or soup.  4 oz (120 g) regular custard.  4 oz (120 g) regular pudding.  8 oz (240 g) plain yogurt.  1 slice bread or toast.  6 saltine crackers.  5 vanilla wafers.  Questions  to ask your health care provider Consider asking the following questions so you know what to do on days when you are sick:  Should I adjust my diabetes medicines?  How often do I need to check my blood glucose?  What supplies do I need to manage my diabetes  at home when I am sick?  What number can I call if I have questions?  What foods and drinks should I avoid?  Contact a health care provider if:  You develop symptoms of diabetic ketoacidosis, such as: ? Fatigue. ? Weight loss. ? Excessive thirst. ? Light-headedness. ? Fruity or sweet-smelling breath. ? Excessive urination. ? Vision changes. ? Confusion or irritability. ? Nausea. ? Vomiting. ? Rapid breathing. ? Pain in the abdomen. ? Feeling flushed.  You are unable to drink fluids without vomiting.  You have any of the following for more than 6 hours: ? Nausea. ? Vomiting. ? Diarrhea.  Your blood glucose is at or above 240 mg/dL (13.3 mmol/L), even after you take an additional insulin dose.  You have a change in how you think, feel, or act (mental status).  You develop another serious illness.  You have been sick or have had a fever for 2 days or longer and you are not getting better. Get help right away if:  Your blood glucose is lower than 54 mg/dL (3.0 mmol/L).  You have difficulty breathing.  You have moderate or high ketone levels in your urine.  You used emergency glucagon to treat low blood glucose. Summary  Blood sugar (glucose) can be difficult to control when you are sick. Common illnesses that can cause problems for people with diabetes (diabetes mellitus) include colds, fever, flu (influenza), nausea, vomiting, and diarrhea.  Illnesses can cause stress and loss of body fluids (dehydration), and those issues can cause blood glucose levels to increase.  Make a plan for days when you are sick (sick day plan) as part of your diabetes management plan. You and your health care provider should make  this plan in advance.  It is very important to take your insulin and diabetes medicines and to eat some form of carbohydrate when you are sick.  Contact your health care provider if have problems managing your blood glucose levels when you are sick, or if you have been sick or had a fever for 2 days or longer and are not getting better. This information is not intended to replace advice given to you by your health care provider. Make sure you discuss any questions you have with your health care provider. Document Released: 02/07/2003 Document Revised: 11/03/2015 Document Reviewed: 11/03/2015 Elsevier Interactive Patient Education  2018 [employer]. Viral Respiratory Infection A respiratory infection is an illness that affects part of the respiratory system, such as the lungs, nose, or throat. Most respiratory infections are caused by either viruses or bacteria. A respiratory infection that is caused by a virus is called a viral respiratory infection. Common types of viral respiratory infections include:  A cold.  The flu (influenza).  A respiratory syncytial virus (RSV) infection.  How do I know if I have a viral respiratory infection? Most viral respiratory infections cause:  A stuffy or runny nose.  Yellow or green nasal discharge.  A cough.  Sneezing.  Fatigue.  Achy muscles.  A sore throat.  Sweating or chills.  A fever.  A headache.  How are viral respiratory infections treated? If influenza is diagnosed early, it may be treated with an antiviral medicine that shortens the length of time a person has symptoms. Symptoms of viral respiratory infections may be treated with over-the-counter and prescription medicines, such as:  Expectorants. These make it easier to cough up mucus.  Decongestant nasal sprays.  Health care providers do not prescribe antibiotic medicines for viral infections. This  is because antibiotics are designed to kill bacteria. They have no  effect on viruses. How do I know if I should stay home from work or school? To avoid exposing others to your respiratory infection, stay home if you have:  A fever.  A persistent cough.  A sore throat.  A runny nose.  Sneezing.  Muscles aches.  Headaches.  Fatigue.  Weakness.  Chills.  Sweating.  Nausea.  Follow these instructions at home:  Rest as much as possible.  Take over-the-counter and prescription medicines only as told by your health care provider.  Drink enough fluid to keep your urine clear or pale yellow. This helps prevent dehydration and helps loosen up mucus.  Gargle with a salt-water mixture 3-4 times per day or as needed. To make a salt-water mixture, completely dissolve -1 tsp of salt in 1 cup of warm water.  Use nose drops made from salt water to ease congestion and soften raw skin around your nose.  Do not drink alcohol.  Do not use tobacco products, including cigarettes, chewing tobacco, and e-cigarettes. If you need help quitting, ask your health care provider. Contact a health care provider if:  Your symptoms last for 10 days or longer.  Your symptoms get worse over time.  You have a fever.  You have severe sinus pain in your face or forehead.  The glands in your jaw or neck become very swollen. Get help right away if:  You feel pain or pressure in your chest.  You have shortness of breath.  You faint or feel like you will faint.  You have severe and persistent vomiting.  You feel confused or disoriented. This information is not intended to replace advice given to you by your health care provider. Make sure you discuss any questions you have with your health care provider. Document Released: 11/14/2004 Document Revised: 07/13/2015 Document Reviewed: 07/13/2014 Elsevier Interactive Patient Education  2018 [place].  Eustachian Tube Dysfunction The eustachian tube connects the middle ear to the back of the nose. It  regulates air pressure in the middle ear by allowing air to move between the ear and nose. It also helps to drain fluid from the middle ear space. When the eustachian tube does not function properly, air pressure, fluid, or both can build up in the middle ear. Eustachian tube dysfunction can affect one or both ears. What are the causes? This condition happens when the eustachian tube becomes blocked or cannot open normally. This may result from:  Ear infections.  Colds and other upper respiratory infections.  Allergies.  Irritation, such as from cigarette smoke or acid from the stomach coming up into the esophagus (gastroesophageal reflux).  Sudden changes in air pressure, such as from descending in an airplane.  Abnormal growths in the nose or throat, such as nasal polyps, tumors, or enlarged tissue at the back of the throat (adenoids).  What increases the risk? This condition may be more likely to develop in people who smoke and people who are overweight. Eustachian tube dysfunction may also be more likely to develop in children, especially children who have:  Certain birth defects of the mouth, such as cleft palate.  Large tonsils and adenoids.  What are the signs or symptoms? Symptoms of this condition may include:  A feeling of fullness in the ear.  Ear pain.  Clicking or popping noises in the ear.  Ringing in the ear.  Hearing loss.  Loss of balance.  Symptoms may get worse when  the air pressure around you changes, such as when you travel to an area of high elevation or fly on an airplane. How is this diagnosed? This condition may be diagnosed based on:  Your symptoms.  A physical exam of your ear, nose, and throat.  Tests, such as those that measure: ? The movement of your eardrum (tympanogram). ? Your hearing (audiometry).  How is this treated? Treatment depends on the cause and severity of your condition. If your symptoms are mild, you may be able to  relieve your symptoms by moving air into ("popping") your ears. If you have symptoms of fluid in your ears, treatment may include:  Decongestants.  Antihistamines.  Nasal sprays or ear drops that contain medicines that reduce swelling (steroids).  In some cases, you may need to have a procedure to drain the fluid in your eardrum (myringotomy). In this procedure, a small tube is placed in the eardrum to:  Drain the fluid.  Restore the air in the middle ear space.  Follow these instructions at home:  Take over-the-counter and prescription medicines only as told by your health care provider.  Use techniques to help pop your ears as recommended by your health care provider. These may include: ? Chewing gum. ? Yawning. ? Frequent, forceful swallowing. ? Closing your mouth, holding your nose closed, and gently blowing as if you are trying to blow air out of your nose.  Do not do any of the following until your health care provider approves: ? Travel to high altitudes. ? Fly in airplanes. ? Work in a Pension scheme manager or room. ? Scuba dive.  Keep your ears dry. Dry your ears completely after showering or bathing.  Do not smoke.  Keep all follow-up visits as told by your health care provider. This is important. Contact a health care provider if:  Your symptoms do not go away after treatment.  Your symptoms come back after treatment.  You are unable to pop your ears.  You have: ? A fever. ? Pain in your ear. ? Pain in your head or neck. ? Fluid draining from your ear.  Your hearing suddenly changes.  You become very dizzy.  You lose your balance. This information is not intended to replace advice given to you by your health care provider. Make sure you discuss any questions you have with your health care provider. Document Released: 03/03/2015 Document Revised: 07/13/2015 Document Reviewed: 02/23/2014 Elsevier Interactive Patient Education  [employer].

## 2017-07-07 ENCOUNTER — Ambulatory Visit: Payer: Self-pay | Admitting: *Deleted

## 2017-07-07 VITALS — BP 147/73 | HR 63 | Wt 333.0 lb

## 2017-07-07 DIAGNOSIS — Z794 Long term (current) use of insulin: Principal | ICD-10-CM

## 2017-07-07 DIAGNOSIS — B9789 Other viral agents as the cause of diseases classified elsewhere: Secondary | ICD-10-CM

## 2017-07-07 DIAGNOSIS — I10 Essential (primary) hypertension: Secondary | ICD-10-CM

## 2017-07-07 DIAGNOSIS — E118 Type 2 diabetes mellitus with unspecified complications: Secondary | ICD-10-CM

## 2017-07-07 DIAGNOSIS — J069 Acute upper respiratory infection, unspecified: Secondary | ICD-10-CM

## 2017-07-07 NOTE — Progress Notes (Signed)
Pt reports CBGs running high over past week or two, likely being contributed to by the Prednisone course. Currently on the 33m qd x1 month. Reports cbg has been as high as 400, but quickly drops back down into 200's. Sts his diet has been poor over past week while sick. "Having peaks and lows of sugars" but denies hyper-/hypoglycemia sx. Was over 300 this morning and took usual 40unit dose and is now trending down.198 currently.  Still not feeling well from URI/sinus infection. Continuing symptomatic treatment. Better than last week when he felt he could not come to work. Slept a lot over the weekend from just feeling run down.

## 2017-07-08 ENCOUNTER — Encounter: Payer: Self-pay | Admitting: Registered Nurse

## 2017-07-08 ENCOUNTER — Telehealth: Payer: Self-pay | Admitting: Registered Nurse

## 2017-07-08 NOTE — Telephone Encounter (Signed)
Patient reported cough improved and feeling better today.  No further questions or concerns at this time.  Continue to hydrate.  Avoid dehydration.  Patient agreed with plan of care and had no further questions at this time.

## 2017-07-15 ENCOUNTER — Ambulatory Visit: Payer: Self-pay | Admitting: *Deleted

## 2017-07-15 VITALS — BP 141/73 | HR 64 | Wt 335.0 lb

## 2017-07-15 DIAGNOSIS — E118 Type 2 diabetes mellitus with unspecified complications: Secondary | ICD-10-CM

## 2017-07-15 DIAGNOSIS — Z794 Long term (current) use of insulin: Secondary | ICD-10-CM

## 2017-07-15 DIAGNOSIS — I10 Essential (primary) hypertension: Secondary | ICD-10-CM

## 2017-07-15 NOTE — Progress Notes (Signed)
Pt's cbg decreasing on average now. This am 105. Able to manage with insulin to keep in low 200's vs high 200's-low 300's as he was running for past 2-3 weeks. No new issues since last being seen in clinic.

## 2017-07-21 ENCOUNTER — Ambulatory Visit: Payer: Self-pay | Admitting: *Deleted

## 2017-07-21 VITALS — BP 178/83 | HR 64 | Wt 340.0 lb

## 2017-07-21 DIAGNOSIS — Z794 Long term (current) use of insulin: Secondary | ICD-10-CM

## 2017-07-21 DIAGNOSIS — I10 Essential (primary) hypertension: Secondary | ICD-10-CM

## 2017-07-21 DIAGNOSIS — E118 Type 2 diabetes mellitus with unspecified complications: Secondary | ICD-10-CM

## 2017-07-21 NOTE — Progress Notes (Signed)
Pt reports cbg is always good, 100-120, in AMs just after waking but quickly rises, 200-300, and stays here throughout the day, after drinking coffee with splenda and non fat non diary dry creamer. 5-6 teaspoons splenda per pt. Per Splenda website, 1gm carb per 1 teaspoon. Encouraged pt to cut down on splenda usage to see if this helps reduce the quick spike in blood sugar in AM. He considers cutting coffee all together.  BP also elevated. Has been trending upward throughout month of May but highest today than it has been since August 2018 per chart review. Weight also increasing steadily. Since Feb 2019 he has fluctuated between 326-329# in clinic. On 5/20 was 333#, 5/28 was 335# and today 340#. Pt sts he does not understand why as he denies recent diet that would have caused this gain. Also denies LE edema. Does endorse noticing that ShOB has increased over shorter distances or simplier activities, but had attributed that to recent URI, sinus infection. Reports feeling better overall from this. No cough noted while in clinic. Pt is still taking 75m daily Prednisone course for newly Dx RA/OA.   Pt has not taken any extra doses of diuretic. Discussed with pt that typically a weight gain of 2# in a day or 5# in a week would warrant f/u per cardiology. Pt would like to try to take an extra dose of Lasix Tu, W, Th, F and f/u with RN in clinic on Friday for weight recheck prior to calling cardiology. RN reviewed s/sx with pt that would require earlier f/u with RN, PCP or ED evaluation, i.e. LE edema, increased ShOB, productive cough with frothy sputum, increased fatigue, palpitations, difficulty breathing lying flat requiring more pillows, etc. Pt verbalizes understanding and agreement.

## 2017-07-31 ENCOUNTER — Ambulatory Visit: Payer: Self-pay | Admitting: *Deleted

## 2017-07-31 VITALS — BP 161/79 | HR 68 | Temp 97.1°F | Resp 20 | Wt 345.0 lb

## 2017-07-31 DIAGNOSIS — Z794 Long term (current) use of insulin: Secondary | ICD-10-CM

## 2017-07-31 DIAGNOSIS — I10 Essential (primary) hypertension: Secondary | ICD-10-CM

## 2017-07-31 DIAGNOSIS — E118 Type 2 diabetes mellitus with unspecified complications: Secondary | ICD-10-CM

## 2017-07-31 NOTE — Progress Notes (Signed)
Pt reports feeling bad x1 week. Now with rhinorrhea, bilateral otalgia. "feels like I'm here and I'm not here." Sts cbg's have been "up and down." 269 this morning, 149 this afternoon. Today is first day in 9 days he has not taken a double dose of Lasix. Did not have acute change in sx to prompt lesser dose, just wanted to see if it was still needed. Sts ShOB has worsened today.  RN spoke to pt via phone on 6/11 and 6/13 and advised pt to call cardiology or nephrology for advisement for f/u. Pt has not yet done this. Sts he wanted to see RN first.   Weight increase again; 5# over past 1.5 weeks. 19# total over past 4-5 weeks. Again advised pt to call Nephro or Cardio. Nephro rx's lasix for fluid gain so he decides to call them while in clinic. Mesg left for triage nurse for call back for appt. Will update RN on Monday as RN out of office tomorrow.

## 2017-08-08 ENCOUNTER — Ambulatory Visit: Payer: Self-pay | Admitting: *Deleted

## 2017-08-08 DIAGNOSIS — M25462 Effusion, left knee: Secondary | ICD-10-CM

## 2017-08-08 DIAGNOSIS — W19XXXA Unspecified fall, initial encounter: Secondary | ICD-10-CM

## 2017-08-08 NOTE — Progress Notes (Signed)
Pt reports fall at neighbor's house yesterday onto brick walking path paver. Fall onto L knee. Swelling, abrasions with puncture wound to L knee. No bruising noted. Spouse cleaned out wound yesterday. No foreign objects appear present today. Cleaned again with first aid antiseptic. Applied triple abx ointment, telfa and paper tape. Wrapped with Ace bandage to help with swelling and fluid collection with fluctuance surrounding patella extending approx 2in above patella into thigh and 2in below. L 2XL knee brace with patellar support also applied as pt reports feeling as though knee is weak due to pain. Pt with slightly reduced ROM 2/2 swelling and pain. RICE protocol reviewed. Supplies and instructions for cleaning wound this weekend provided to pt. He denies any further questions or concerns. Appt set to see RN on Monday 6/24 for f/u. Will determine at that time if NP appt is needed if not healing as anticipated. Pt agreeable with this plan.

## 2017-08-11 ENCOUNTER — Encounter: Payer: Self-pay | Admitting: Registered Nurse

## 2017-08-11 ENCOUNTER — Ambulatory Visit: Payer: Self-pay | Admitting: Registered Nurse

## 2017-08-11 VITALS — BP 109/70 | HR 62 | Temp 97.2°F | Wt 348.0 lb

## 2017-08-11 DIAGNOSIS — L03116 Cellulitis of left lower limb: Secondary | ICD-10-CM

## 2017-08-11 DIAGNOSIS — W010XXA Fall on same level from slipping, tripping and stumbling without subsequent striking against object, initial encounter: Secondary | ICD-10-CM

## 2017-08-11 MED ORDER — DOXYCYCLINE HYCLATE 100 MG PO TABS: 100.0000 mg | ORAL_TABLET | Freq: Two times a day (BID) | ORAL | 0 refills | Status: AC

## 2017-08-11 NOTE — Progress Notes (Signed)
Subjective:    Patient ID: Harold [name], male    DOB: 12-26-1955, 62 y.o.   MRN: [id number]  61y/o Caucasian established male pt presenting for f/u of L knee injury seen by RN on 08/08/17. Has been weeping over the weekend so he left it open to air yesterday. Otherwise cleaning with mild soap in shower, then cleaning with first aid antiseptic, applying triple abx ointment, telfa, and ace bandage. No brace in place today. Does not feel as though knee is weak or going to give way or locking up, just feels sore and stiff. Swelling still present but has decreased since Friday.  Wound cleaned and dressed by RN Harold [name]. Washed out with first aid antiseptic spray. Triple antibiotic ointment, telfa pad applied with paper tape. Wrapped with ace bandage.  Was seen by nephro last Wed and Prednisone was stopped 2/2 fluid overload. Feels better now, less congested, bloated. ShOB and walking distance/stairs tolerance about the same per pt.  Patient reported he tripped on unstable brick walkway last week and was seen by RN Harold [name]  Patient reported still running in 200s for blood sugars even though prednisone stopped last week.  Left lower leg more swollen than right which is opposite of his usual     Review of Systems  Constitutional: Positive for unexpected weight change. Negative for activity change, appetite change, chills, diaphoresis, fatigue and fever.  HENT: Negative for trouble swallowing and voice change.   Eyes: Negative for photophobia and visual disturbance.  Respiratory: Positive for shortness of breath. Negative for cough, wheezing and stridor.   Cardiovascular: Positive for leg swelling. Negative for chest pain.  Gastrointestinal: Negative for diarrhea, nausea and vomiting.  Endocrine: Negative for cold intolerance and heat intolerance.  Genitourinary: Negative for difficulty urinating.  Musculoskeletal: Positive for arthralgias, joint swelling and myalgias. Negative for gait problem, neck  pain and neck stiffness.  Skin: Positive for color change and rash. Negative for pallor and wound.  Allergic/Immunologic: Negative for food allergies.  Neurological: Negative for dizziness, tremors, seizures, syncope, speech difficulty, weakness, light-headedness, numbness and headaches.  Hematological: Negative for adenopathy. Does not bruise/bleed easily.  Psychiatric/Behavioral: Negative for agitation, confusion and sleep disturbance.       Objective:   Physical Exam  Constitutional: He is oriented to person, place, and time. Vital signs are normal. He appears well-developed and well-nourished. He is active and cooperative.  Non-toxic appearance. He does not have a sickly appearance. He does not appear ill. No distress.  HENT:  Head: Normocephalic and atraumatic.  Right Ear: Hearing and external ear normal.  Left Ear: Hearing and external ear normal.  Nose: Nose normal.  Mouth/Throat: Uvula is midline, oropharynx is clear and moist and mucous membranes are normal. No oropharyngeal exudate. No tonsillar exudate.  Eyes: Pupils are equal, round, and reactive to light. Conjunctivae, EOM and lids are normal. Right eye exhibits no discharge. Left eye exhibits no discharge. No scleral icterus.  Neck: Trachea normal, normal range of motion and phonation normal. Neck supple. No tracheal deviation present.  Cardiovascular: Normal rate, regular rhythm, normal heart sounds and intact distal pulses.  Pulses:      Radial pulses are 2+ on the right side, and 2+ on the left side.  Pulmonary/Chest: Effort normal and breath sounds normal. No stridor. No respiratory distress. He has no decreased breath sounds. He has no wheezes. He has no rhonchi. He has no rales.  No cough observed in exam room; spoke full sentences without difficulty  Abdominal:  Soft. Normal appearance. He exhibits no distension. There is no rigidity and no guarding.  Musculoskeletal: Normal range of motion. He exhibits edema and  tenderness. He exhibits no deformity.       Right shoulder: Normal.       Left shoulder: Normal.       Right elbow: Normal.      Left elbow: Normal.       Right hip: Normal.       Left hip: Normal.       Right knee: Normal.       Left knee: He exhibits swelling and erythema. No medial joint line, no lateral joint line, no MCL, no LCL and no patellar tendon tenderness noted.       Right ankle: Normal.       Left ankle: Normal.       Cervical back: Normal.       Thoracic back: Normal.       Lumbar back: Normal.       Right hand: Normal.       Left hand: Normal.  Left knee with abrasions over anterior surface 90% irregular border centrally macerated with opaque thick discharge yellow white green; removed 4x4 inch gauze telfa and paper tape to perform evaluation and gauze 75% saturated with purulent discharge and patient had 3 inch ace banage over telfa gauze; 1+/4 nonpitting edema localized no crepitus full arom with slight pain due to open abrasion  Lymphadenopathy:    He has no cervical adenopathy.       Right cervical: No superficial cervical adenopathy present.      Left cervical: No superficial cervical adenopathy present.  Neurological: He is alert and oriented to person, place, and time. He has normal strength. He is not disoriented. He displays no atrophy and no tremor. No cranial nerve deficit or sensory deficit. He exhibits normal muscle tone. He displays no seizure activity. Coordination and gait normal. GCS eye subscore is 4. GCS verbal subscore is 5. GCS motor subscore is 6.  Gait sure and steady in hallway; in/out of chair without difficulty  Skin: Skin is warm. Capillary refill takes less than 2 seconds. Abrasion and rash noted. No bruising, no burn, no ecchymosis, no lesion, no petechiae and no purpura noted. Rash is macular, papular and maculopapular. Rash is not nodular, not pustular, not vesicular and not urticarial. He is not diaphoretic. There is erythema. No cyanosis. No  pallor. Nails show no clubbing.     Abrasions left anterior knee overlying patella largest diameter 5cm irregular border intact brown scab periphery and centrally macerated skin purulent discharge opaque white/yellow/green/brown thick  Psychiatric: He has a normal mood and affect. His speech is normal and behavior is normal. Judgment and thought content normal. He is not actively hallucinating. Cognition and memory are normal. He is attentive.  Nursing note and vitals reviewed.         Assessment & Plan:  A-cellulitis left knee  P-Start doxycycline 134m po BID x 7 days #20 RF0 discussed wear protective clothing/sunscreen as increased sunburn risk while on doxycycline.  Patient allergic to bactrim and penicillins and keflex could increase his bleeding/bleeding risk with chronic medications.  No dose adjustment required for doxycycline with decreased GFR per [facility].  Has tolerated well in the past.  Exitcare handout on skin infection  Follow up tomorrow for repeat wound care and check with RN HCheri Guppy RTC if worsening erythema, pain, purulent discharge, fever. Wash area with soap and water at least  twice a day and place new clean dressing.   Purulent discharge should decrease over the next 48 hours.   Wash towels, washcloths, sheets in hot water with bleach every couple of days until infection resolved. Patient verbalized understanding, agreed with plan of care and had no further questions at this time.

## 2017-08-11 NOTE — Patient Instructions (Signed)
Cellulitis, Adult Cellulitis is a skin infection. The infected area is usually red and tender. This condition occurs most often in the arms and lower legs. The infection can travel to the muscles, blood, and underlying tissue and become serious. It is very important to get treated for this condition. What are the causes? Cellulitis is caused by bacteria. The bacteria enter through a break in the skin, such as a cut, burn, insect bite, open sore, or crack. What increases the risk? This condition is more likely to occur in people who:  Have a weak defense system (immune system).  Have open wounds on the skin such as cuts, burns, bites, and scrapes. Bacteria can enter the body through these open wounds.  Are older.  Have diabetes.  Have a type of long-lasting (chronic) liver disease (cirrhosis) or kidney disease.  Use IV drugs.  What are the signs or symptoms? Symptoms of this condition include:  Redness, streaking, or spotting on the skin.  Swollen area of the skin.  Tenderness or pain when an area of the skin is touched.  Warm skin.  Fever.  Chills.  Blisters.  How is this diagnosed? This condition is diagnosed based on a medical history and physical exam. You may also have tests, including:  Blood tests.  Lab tests.  Imaging tests.  How is this treated? Treatment for this condition may include:  Medicines, such as antibiotic medicines or antihistamines.  Supportive care, such as rest and application of cold or warm cloths (cold or warm compresses) to the skin.  Hospital care, if the condition is severe.  The infection usually gets better within 1-2 days of treatment. Follow these instructions at home:  Take over-the-counter and prescription medicines only as told by your health care provider.  If you were prescribed an antibiotic medicine, take it as told by your health care provider. Do not stop taking the antibiotic even if you start to feel  better.  Drink enough fluid to keep your urine clear or pale yellow.  Do not touch or rub the infected area.  Raise (elevate) the infected area above the level of your heart while you are sitting or lying down.  Apply warm or cold compresses to the affected area as told by your health care provider.  Keep all follow-up visits as told by your health care provider. This is important. These visits let your health care provider make sure a more serious infection is not developing. Contact a health care provider if:  You have a fever.  Your symptoms do not improve within 1-2 days of starting treatment.  Your bone or joint underneath the infected area becomes painful after the skin has healed.  Your infection returns in the same area or another area.  You notice a swollen bump in the infected area.  You develop new symptoms.  You have a general ill feeling (malaise) with muscle aches and pains. Get help right away if:  Your symptoms get worse.  You feel very sleepy.  You develop vomiting or diarrhea that persists.  You notice red streaks coming from the infected area.  Your red area gets larger or turns dark in color. This information is not intended to replace advice given to you by your health care provider. Make sure you discuss any questions you have with your health care provider. Document Released: 11/14/2004 Document Revised: 06/15/2015 Document Reviewed: 12/14/2014 Elsevier Interactive Patient Education  [employer].

## 2017-08-14 ENCOUNTER — Ambulatory Visit: Payer: Self-pay | Admitting: *Deleted

## 2017-08-14 VITALS — BP 140/77 | HR 67 | Wt 350.0 lb

## 2017-08-14 DIAGNOSIS — I10 Essential (primary) hypertension: Secondary | ICD-10-CM

## 2017-08-14 DIAGNOSIS — Z794 Long term (current) use of insulin: Secondary | ICD-10-CM

## 2017-08-14 DIAGNOSIS — E118 Type 2 diabetes mellitus with unspecified complications: Secondary | ICD-10-CM

## 2017-08-14 DIAGNOSIS — S8992XD Unspecified injury of left lower leg, subsequent encounter: Secondary | ICD-10-CM

## 2017-08-14 NOTE — Progress Notes (Signed)
Pt presents for wound check of L knee. Wound improving, healing well. Same amount drainage, similar to  08/11/17. Cleaned and redressed.  BP and wt check. Wt increased to 350lb. BLE edema worsening. ShOB increased. Audible expiratory wheezing during exertion. Nephro f/u in 4 days.

## 2017-08-18 ENCOUNTER — Ambulatory Visit: Payer: Self-pay | Admitting: *Deleted

## 2017-08-18 VITALS — BP 132/77 | HR 66 | Wt 351.0 lb

## 2017-08-18 DIAGNOSIS — Z794 Long term (current) use of insulin: Secondary | ICD-10-CM

## 2017-08-18 DIAGNOSIS — I10 Essential (primary) hypertension: Secondary | ICD-10-CM

## 2017-08-18 DIAGNOSIS — S8992XD Unspecified injury of left lower leg, subsequent encounter: Secondary | ICD-10-CM

## 2017-08-18 DIAGNOSIS — E118 Type 2 diabetes mellitus with unspecified complications: Secondary | ICD-10-CM

## 2017-08-18 NOTE — Progress Notes (Signed)
Pt saw Nephrology this am. Started on Metolazone in conjunction with again increased Lasix (65m TID). F/u in one week. Pt reports wt 351.4 there today. Same on RN clinic scale. Up 1.5lbs from 6/28 despite pt reporting no appetite and eating only one meal yesterday with no snacking, much outside his usual.  Wound check. Road rash like abrasions healing well. Large central abrasion with red-purple enlarged border. Drainage remains yellow-green, thick. Telfa dressing saturated approx 50% with drainage. 1+ nonpitting edema remains localized to knee anterior and lateral to wound. No crepitus. Full AROM, becoming easier to move per pt with less pain and stiffness over the weekend.  Wound cleansed with first aid antiseptic spray. Triple abx ointment applied to telfa, then telfa to wound. Border where paper tape has been in place >1 week being changed once daily is red, irritated. Orientation of telfa pad and tape changed to vertical along wound to avoid irritated skin. Rewrapped with ace bandage. Appt made with NP for tomorrow for recheck. Pt reports 6 days oral abx remaining.

## 2017-08-19 ENCOUNTER — Encounter: Payer: Self-pay | Admitting: Registered Nurse

## 2017-08-19 ENCOUNTER — Ambulatory Visit: Payer: Self-pay | Admitting: Registered Nurse

## 2017-08-19 VITALS — BP 132/69 | HR 66 | Temp 96.7°F

## 2017-08-19 DIAGNOSIS — S80212D Abrasion, left knee, subsequent encounter: Secondary | ICD-10-CM

## 2017-08-19 DIAGNOSIS — L03116 Cellulitis of left lower limb: Secondary | ICD-10-CM

## 2017-08-19 DIAGNOSIS — R600 Localized edema: Secondary | ICD-10-CM

## 2017-08-19 MED ORDER — DOXYCYCLINE HYCLATE 100 MG PO TABS: 100.0000 mg | ORAL_TABLET | Freq: Two times a day (BID) | ORAL | 0 refills | Status: AC

## 2017-08-19 NOTE — Patient Instructions (Addendum)
How to Use Compression Stockings Compression stockings are elastic socks that squeeze the legs. They help to increase blood flow to the legs, decrease swelling in the legs, and reduce the chance of developing blood clots in the lower legs. Compression stockings are often used by people who:  Are recovering from surgery.  Have poor circulation in their legs.  Are prone to getting blood clots in their legs.  Have varicose veins.  Sit or stay in bed for long periods of time.  How to use compression stockings Before you put on your compression stockings:  Make sure that they are the correct size. If you do not know your size, ask your health care provider.  Make sure that they are clean, dry, and in good condition.  Check them for rips and tears. Do not put them on if they are ripped or torn.  Put your stockings on first thing in the morning, before you get out of bed. Keep them on for as long as your health care provider advises. When you are wearing your stockings:  Keep them as smooth as possible. Do not allow them to bunch up. It is especially important to prevent the stockings from bunching up around your toes or behind your knees.  Do not roll the stockings downward and leave them rolled down. This can decrease blood flow to your leg.  Change them right away if they become wet or dirty.  When you take off your stockings, inspect your legs and feet. Anything that does not seem normal may require medical attention. Look for:  Open sores.  Red spots.  Swelling.  Information and tips  Do not stop wearing your compression stockings without talking to your health care provider first.  Wash your stockings every day with mild detergent in cold or warm water. Do not use bleach. Air-dry your stockings or dry them in a clothes dryer on low heat.  Replace your stockings every 3-6 months.  If skin moisturizing is part of your treatment plan, apply lotion or cream at night so that  your skin will be dry when you put on the stockings in the morning. It is harder to put the stockings on when you have lotion on your legs or feet. Contact a health care provider if: Remove your stockings and seek medical care if:  You have a feeling of pins and needles in your feet or legs.  You have any new changes in your skin.  You have skin lesions that are getting worse.  You have swelling or pain that is getting worse.  Get help right away if:  You have numbness or tingling in your lower legs that does not get better right after you take the stockings off.  Your toes or feet become cold and blue.  You develop open sores or red spots on your legs that do not go away.  You see or feel a warm spot on your leg.  You have new swelling or soreness in your leg.  You are short of breath or you have chest pain for no reason.  You have a rapid or irregular heartbeat.  You feel light-headed or dizzy. This information is not intended to replace advice given to you by your health care provider. Make sure you discuss any questions you have with your health care provider. Document Released: 12/02/2008 Document Revised: 07/05/2015 Document Reviewed: 01/12/2014 Elsevier Interactive Patient Education  2018 [employer] Cellulitis, Adult Cellulitis is a skin infection. The infected area  is usually red and tender. This condition occurs most often in the arms and lower legs. The infection can travel to the muscles, blood, and underlying tissue and become serious. It is very important to get treated for this condition. What are the causes? Cellulitis is caused by bacteria. The bacteria enter through a break in the skin, such as a cut, burn, insect bite, open sore, or crack. What increases the risk? This condition is more likely to occur in people who:  Have a weak defense system (immune system).  Have open wounds on the skin such as cuts, burns, bites, and scrapes. Bacteria can enter the  body through these open wounds.  Are older.  Have diabetes.  Have a type of long-lasting (chronic) liver disease (cirrhosis) or kidney disease.  Use IV drugs.  What are the signs or symptoms? Symptoms of this condition include:  Redness, streaking, or spotting on the skin.  Swollen area of the skin.  Tenderness or pain when an area of the skin is touched.  Warm skin.  Fever.  Chills.  Blisters.  How is this diagnosed? This condition is diagnosed based on a medical history and physical exam. You may also have tests, including:  Blood tests.  Lab tests.  Imaging tests.  How is this treated? Treatment for this condition may include:  Medicines, such as antibiotic medicines or antihistamines.  Supportive care, such as rest and application of cold or warm cloths (cold or warm compresses) to the skin.  Hospital care, if the condition is severe.  The infection usually gets better within 1-2 days of treatment. Follow these instructions at home:  Take over-the-counter and prescription medicines only as told by your health care provider.  If you were prescribed an antibiotic medicine, take it as told by your health care provider. Do not stop taking the antibiotic even if you start to feel better.  Drink enough fluid to keep your urine clear or pale yellow.  Do not touch or rub the infected area.  Raise (elevate) the infected area above the level of your heart while you are sitting or lying down.  Apply warm or cold compresses to the affected area as told by your health care provider.  Keep all follow-up visits as told by your health care provider. This is important. These visits let your health care provider make sure a more serious infection is not developing. Contact a health care provider if:  You have a fever.  Your symptoms do not improve within 1-2 days of starting treatment.  Your bone or joint underneath the infected area becomes painful after the skin  has healed.  Your infection returns in the same area or another area.  You notice a swollen bump in the infected area.  You develop new symptoms.  You have a general ill feeling (malaise) with muscle aches and pains. Get help right away if:  Your symptoms get worse.  You feel very sleepy.  You develop vomiting or diarrhea that persists.  You notice red streaks coming from the infected area.  Your red area gets larger or turns dark in color. This information is not intended to replace advice given to you by your health care provider. Make sure you discuss any questions you have with your health care provider. Document Released: 11/14/2004 Document Revised: 06/15/2015 Document Reviewed: 12/14/2014 Elsevier Interactive Patient Education  2018 [employer] An abrasion is a cut or scrape on the outer surface of your skin. An abrasion does not  extend through all of the layers of your skin. It is important to care for your abrasion properly to prevent infection. What are the causes? Most abrasions are caused by falling on or gliding across the ground or another surface. When your skin rubs on something, the outer and inner layer of skin rubs off. What are the signs or symptoms? A cut or scrape is the main symptom of this condition. The scrape may be bleeding, or it may appear red or pink. If there was an associated fall, there may be an underlying bruise. How is this diagnosed? An abrasion is diagnosed with a physical exam. How is this treated? Treatment for this condition depends on how large and deep the abrasion is. Usually, your abrasion will be cleaned with water and mild soap. This removes any dirt or debris that may be stuck. An antibiotic ointment may be applied to the abrasion to help prevent infection. A bandage (dressing) may be placed on the abrasion to keep it clean. You may also need a tetanus shot. Follow these instructions at home: Medicines  Take or apply  medicines only as directed by your health care provider.  If you were prescribed an antibiotic ointment, finish all of it even if you start to feel better. Wound care  Clean the wound with mild soap and water 2-3 times per day or as directed by your health care provider. Pat your wound dry with a clean towel. Do not rub it.  There are many different ways to close and cover a wound. Follow instructions from your health care provider about: ? Wound care. ? Dressing changes and removal.  Check your wound every day for signs of infection. Watch for: ? Redness, swelling, or pain. ? Fluid, blood, or pus. General instructions   Keep the dressing dry as directed by your health care provider. Do not take baths, swim, use a hot tub, or do anything that would put your wound underwater until your health care provider approves.  If there is swelling, raise (elevate) the injured area above the level of your heart while you are sitting or lying down.  Keep all follow-up visits as directed by your health care provider. This is important. Contact a health care provider if:  You received a tetanus shot and you have swelling, severe pain, redness, or bleeding at the injection site.  Your pain is not controlled with medicine.  You have increased redness, swelling, or pain at the site of your wound. Get help right away if:  You have a red streak going away from your wound.  You have a fever.  You have fluid, blood, or pus coming from your wound.  You notice a bad smell coming from your wound or your dressing. This information is not intended to replace advice given to you by your health care provider. Make sure you discuss any questions you have with your health care provider. Document Released: 11/14/2004 Document Revised: 10/06/2015 Document Reviewed: 02/02/2014 Elsevier Interactive Patient Education  2017 [employer].

## 2017-08-19 NOTE — Progress Notes (Signed)
Subjective:    Patient ID: Harold [name], male    DOB: 04-01-1955, 62 y.o.   MRN: [id number]  61y/o Caucasian male established male pt presenting for f/u of L knee injury. Has been cleaning and dressing wound daily either at home or by RN. Yesterday was still having thick yellow-green discharge from central abrasion of wound saturating approx 50% of telfa when RN Cheri Guppy saw pt and cleaned wound. Pt is applying first aid antiseptic spray, triple abx ointment, telfa, and ace bandage daily. Removing dressing for shower and leaving wound open to air for a few hours a day at home. Today he reports telfa pad is same one from RN change yesterday, so 24hrs and has approx 25% saturation from drainage. Lower half of wound edges have healed more since yesterday, almost approximated. Upper half abrasion has much less yellow drainage noted at puncture site and healthy red granular tissue at border. Last seen by me on 08/11/17 office visit has been taking his doxycycline 141m po BID and has a couple days left he thinks 6 pills.    Patient had  tripped on unstable walkway and abraded left knee which then became infected.  Diabetic  Blood sugars still running high same as when he started prednisone which has since been discontinued by his primary team for arthritis.  Lower leg swelling still occurring bilaterally left greater than right denied hand/finger swelling/dyspnea.     Review of Systems Review of Systems  Constitutional: Positive for unexpected weight change. Negative for activity change, appetite change, chills, diaphoresis, fatigue and fever.  HENT: Negative for trouble swallowing and voice change.   Eyes: Negative for photophobia and visual disturbance.  Respiratory:  Negative for cough, wheezing and stridor.   Cardiovascular: Positive for leg swelling. Negative for chest pain.  Gastrointestinal: Negative for diarrhea, nausea and vomiting.  Endocrine: Negative for cold intolerance and  heat intolerance.  Genitourinary: Negative for difficulty urinating.  Musculoskeletal: Positive for arthralgias, joint swelling and myalgias. Negative for gait problem, neck pain and neck stiffness.  Skin: Positive for color change and rash. Negative for pallor and wound.  Allergic/Immunologic: Negative for food allergies.  Neurological: Negative for dizziness, tremors, seizures, syncope, speech difficulty, weakness, light-headedness, numbness and headaches.  Hematological: Negative for adenopathy. Does not bruise/bleed easily.  Psychiatric/Behavioral: Negative for agitation, confusion and sleep disturbance.    Objective:   Physical Exam  Constitutional: He is oriented to person, place, and time. Vital signs are normal. He appears well-developed and well-nourished. He is active and cooperative.  Non-toxic appearance. He does not have a sickly appearance. He does not appear ill. No distress.  HENT:  Head: Normocephalic and atraumatic.  Right Ear: Hearing and external ear normal.  Left Ear: Hearing and external ear normal.  Nose: Nose normal.  Mouth/Throat: Uvula is midline, oropharynx is clear and moist and mucous membranes are normal. No oropharyngeal exudate. No tonsillar exudate.  Eyes: Pupils are equal, round, and reactive to light. Conjunctivae, EOM and lids are normal. Right eye exhibits no discharge. Left eye exhibits no discharge. No scleral icterus.  Neck: Trachea normal, normal range of motion and phonation normal. Neck supple. No tracheal deviation present.  Cardiovascular: Normal rate, regular rhythm, normal heart sounds and intact distal pulses.  Pulses:      Radial pulses are 2+ on the right side, and 2+ on the left side.  Pulmonary/Chest: Effort normal and breath sounds normal. No stridor. No respiratory distress. He has no decreased breath sounds. He  has no wheezes. He has no rhonchi. He has no rales.  No cough observed in exam room; spoke full sentences without difficulty   Abdominal: Soft. Normal appearance. He exhibits no distension. There is no rigidity and no guarding.  Musculoskeletal: Normal range of motion. He exhibits edema and tenderness. He exhibits no deformity.       Right shoulder: Normal.       Left shoulder: Normal.       Right elbow: Normal.      Left elbow: Normal.       Right hip: Normal.       Left hip: Normal.       Right knee: Normal.       Left knee: He exhibits swelling and erythema. No medial joint line, no lateral joint line, no MCL, no LCL and no patellar tendon tenderness noted.       Right ankle: Normal.       Left ankle: Normal.       Cervical back: Normal.       Thoracic back: Normal.       Lumbar back: Normal.       Right hand: Normal.       Left hand: Normal.  Left knee with abrasions over anterior surface 90% irregular border maceration and purulent discharge resolved.  Wound bed beefy red.  Removed 4x4 inch gauze telfa and paper tape to perform evaluation and gauze with scant tan discharge discharge and patient had 3 inch ace banage over telfa gauze; edema left knee almost completely resolved less than 1+/4 nonpitting edema localized no crepitus full arom with slight pain due to open abrasion; bilateral lower leg edema left greater than right 3+ left pitting and right 2+ minimally pitting to mid shin/ankle  Lymphadenopathy:    He has no cervical adenopathy.       Right cervical: No superficial cervical adenopathy present.      Left cervical: No superficial cervical adenopathy present.  Neurological: He is alert and oriented to person, place, and time. He has normal strength. He is not disoriented. He displays no atrophy and no tremor. No cranial nerve deficit or sensory deficit. He exhibits normal muscle tone. He displays no seizure activity. Coordination and gait normal. GCS eye subscore is 4. GCS verbal subscore is 5. GCS motor subscore is 6.  Gait sure and steady in hallway; in/out of chair without difficulty  Skin: Skin is  warm. Capillary refill takes less than 2 seconds. Abrasion and rash noted. No bruising, no burn, no ecchymosis, no lesion, no petechiae and no purpura noted. Rash is macular, papular and maculopapular. Rash is not nodular, not pustular, not vesicular and not urticarial. He is not diaphoretic. There is erythema. No cyanosis. No pallor. Nails show no clubbing.     Abrasions left anterior knee overlying patella largest diameter 5cm irregular border intact brown scab periphery; beefy red wound base no purulent discharge; abrasion linear in shape of telfa pad outline left knee patient reported from paper tape irritation no longer using securing with ace bandage  Psychiatric: He has a normal mood and affect. His speech is normal and behavior is normal. Judgment and thought content normal. He is not actively hallucinating. Cognition and memory are normal. He is attentive.  Nursing note and vitals reviewed.              Assessment & Plan:  A-cellulitis left knee, abrasion, fall subsequent encounter, leg swelling  P-Finish doxycycline 126m po BID x 10  days #20  At home. Do not soak in tub/pool/hot tub/river/lake or pond until healed.  Given another bottle in case purulent discharge resumes during holiday weekend/clinic closure.  He is to follow up with RN Hildred Alamin on Monday 8 Jul for re-evaluation and myself 9 Jul in clinic.  Dispensed doxycycline 166m po BID #20 RF0 from [facility] today to patient.  discussed wear protective clothing/sunscreen as increased sunburn risk while on doxycycline.  Patient allergic to bactrim and penicillins and keflex could increase his bleeding/bleeding risk with chronic medications.  No dose adjustment required for doxycycline with decreased GFR per [facility].  Has tolerated well in the past.  Exitcare handout on skin infection RTC if worsening erythema, pain, purulent discharge, fever. Wash area with soap and water at least twice a day and place new clean dressing.   Purulent  discharge should not resume and if it does restart doxycycline new bottle this weekend.  Do not pick or scratch at wound.   Wash towels, washcloths, sheets in hot water with bleach every couple of days until infection resolved. Patient verbalized understanding, agreed with plan of care and had no further questions at this time.   Discussed with patient recommended start wearing compression socks to knees bilaterally and elevating legs when sitting at work and home.  Following up with PCM and continuing regular BP and weight checks [facility]  Follow up sooner if greater than 2 lb change in weight gain per day, SOB, noticeable change worsening in his leg swelling and no improvement with hydration and elevation of legs with his provider.  Patient verbalized understanding information/instructions, agreed with plan of care and had no further questions at this time.

## 2017-08-25 ENCOUNTER — Encounter (HOSPITAL_COMMUNITY): Payer: Self-pay

## 2017-08-25 ENCOUNTER — Emergency Department (HOSPITAL_COMMUNITY)
Admission: EM | Admit: 2017-08-25 | Discharge: 2017-08-25 | Disposition: A | Discharge status: Home / Self Care | Payer: No Typology Code available for payment source | Attending: Emergency Medicine | Admitting: Emergency Medicine

## 2017-08-25 ENCOUNTER — Other Ambulatory Visit: Payer: Self-pay

## 2017-08-25 DIAGNOSIS — R799 Abnormal finding of blood chemistry, unspecified: Secondary | ICD-10-CM | POA: Diagnosis present

## 2017-08-25 DIAGNOSIS — R638 Other symptoms and signs concerning food and fluid intake: Secondary | ICD-10-CM | POA: Diagnosis not present

## 2017-08-25 DIAGNOSIS — R531 Weakness: Secondary | ICD-10-CM | POA: Diagnosis not present

## 2017-08-25 DIAGNOSIS — Z955 Presence of coronary angioplasty implant and graft: Secondary | ICD-10-CM | POA: Diagnosis not present

## 2017-08-25 DIAGNOSIS — I130 Hypertensive heart and chronic kidney disease with heart failure and stage 1 through stage 4 chronic kidney disease, or unspecified chronic kidney disease: Secondary | ICD-10-CM | POA: Insufficient documentation

## 2017-08-25 DIAGNOSIS — R0601 Orthopnea: Secondary | ICD-10-CM | POA: Diagnosis not present

## 2017-08-25 DIAGNOSIS — Z7982 Long term (current) use of aspirin: Secondary | ICD-10-CM | POA: Diagnosis not present

## 2017-08-25 DIAGNOSIS — E1122 Type 2 diabetes mellitus with diabetic chronic kidney disease: Secondary | ICD-10-CM | POA: Insufficient documentation

## 2017-08-25 DIAGNOSIS — I2510 Atherosclerotic heart disease of native coronary artery without angina pectoris: Secondary | ICD-10-CM | POA: Diagnosis not present

## 2017-08-25 DIAGNOSIS — E1140 Type 2 diabetes mellitus with diabetic neuropathy, unspecified: Secondary | ICD-10-CM | POA: Insufficient documentation

## 2017-08-25 DIAGNOSIS — N182 Chronic kidney disease, stage 2 (mild): Secondary | ICD-10-CM | POA: Insufficient documentation

## 2017-08-25 DIAGNOSIS — Z87891 Personal history of nicotine dependence: Secondary | ICD-10-CM | POA: Insufficient documentation

## 2017-08-25 DIAGNOSIS — R0602 Shortness of breath: Secondary | ICD-10-CM | POA: Diagnosis not present

## 2017-08-25 DIAGNOSIS — I5032 Chronic diastolic (congestive) heart failure: Secondary | ICD-10-CM | POA: Insufficient documentation

## 2017-08-25 DIAGNOSIS — R7989 Other specified abnormal findings of blood chemistry: Secondary | ICD-10-CM | POA: Diagnosis not present

## 2017-08-25 DIAGNOSIS — N289 Disorder of kidney and ureter, unspecified: Secondary | ICD-10-CM | POA: Insufficient documentation

## 2017-08-25 DIAGNOSIS — Z79899 Other long term (current) drug therapy: Secondary | ICD-10-CM | POA: Diagnosis not present

## 2017-08-25 DIAGNOSIS — Z794 Long term (current) use of insulin: Secondary | ICD-10-CM | POA: Diagnosis not present

## 2017-08-25 DIAGNOSIS — J449 Chronic obstructive pulmonary disease, unspecified: Secondary | ICD-10-CM | POA: Insufficient documentation

## 2017-08-25 NOTE — ED Provider Notes (Signed)
[facility] DEPT Provider Note   CSN: [id number] Arrival date & time: 08/25/17  1800     History   Chief Complaint Chief Complaint  Patient presents with  . Abnormal Lab    BUN-100, creat-2.9    HPI Harold [name] is a 62 y.o. male.  HPI   He is here for evaluation of elevated BUN and creatinine.  He saw his PCP today for checkup because of general weak feeling, decreased appetite, he has ongoing shortness of breath and orthopnea.  He saw his nephrologist, 1 week ago for weight gain associated with use of prednisone for osteoarthritis.  The nephrologist increased his Lasix to 80 mg 3 times daily.  He has subsequently lost 22 pounds, and feels less swollen.  He denies headache, chest pain, palpitations, focal weakness or paresthesia.  There are no other known modifying factors.  Past Medical History:  Diagnosis Date  . Anginal pain [facility]) March 2015   Cardiac cath showed patent stents with distal LAD, circumflex-OM and RCA disease in small vessels.  . Anxiety   . CAD S/P percutaneous coronary angioplasty 2003, 04/2012   status post PCI to LAD, circumflex-OM 2, RCA  . Carotid artery occlusion   . Chronic renal insufficiency, stage II (mild)   . Chronic venous insufficiency    varicosities, no reflux; dopplers 04/14/12- valvular insufficiency in the R and L GSV  . Complication of anesthesia   . COPD, mild ([place])   . Depression    situaltional   . Diabetes mellitus   . Dyslipidemia associated with type 2 diabetes mellitus ([place])   . GERD (gastroesophageal reflux disease)   . HTN (hypertension)   . Hypothyroidism   . Neuromuscular disorder (HCC)    neuropathy in feet  . Neuropathy    notably improved following PCI with improved cardiac function  . Obesity   . 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 PONV (postoperative nausea and vomiting)    "[place]"    . Pulmonary hypertension ([place]) 04/2012   PA pressure 72mhg  . Sleep apnea    uses nightly    Patient Active Problem List   Diagnosis Date Noted  . Trigger finger 03/03/2017  . Pain in finger of left hand 03/03/2017  . Snoring 08/20/2016  . Morbid (severe) obesity due to excess calories ([place] 04/18/2016  . Venous stasis of both lower extremities - with edema 02/25/2015  . Gastroesophageal reflux disease without esophagitis 10/10/2013  . Chronic diastolic heart failure, NYHA class 2 - PCWPf 22 mmHg during catheterization. 06/27/2013  . COPD (chronic obstructive pulmonary disease) ([place] 06/17/2013  . Restrictive lung disease 06/17/2013  . Myalgia and myositis 04/20/2013  . Aftercare following surgery of the circulatory system, [place]06[place]  . Obesity, Class III, BMI 40-49.9 (morbid obesity) ([place] 09/01/2012  . Fatigue 09/01/2012  . Preoperative cardiovascular examination 09/01/2012  . Chronic renal insufficiency, stage II (mild) 04/30/2012  . OSA on CPAP 04/29/2012  . Pulmonary hypertension, 564mg 04/29/2012  . Dyspnea on exertion   04/26/2012  . CAD, LAD/CFX DES 04/28/12- staged RCA DES 04/29/12 after pt declined CABG, cath 05/14/13 stable CAD medical therapy   . Occlusion and stenosis of carotid artery without mention of cerebral infarction 11/07/2011  . Diabetes mellitus type 2 with neurological manifestations ([place]/12/2011  . Essential hypertension 04/29/2011  . Neuropathy 04/29/2011  . Combined hyperlipidemia associated with  type 2 diabetes mellitus and obesity 04/29/2011  . Depression 04/29/2011    Past Surgical History:  Procedure Laterality Date  . ABDOMINAL AORTAGRAM N/A 11/15/2011   Procedure: ABDOMINAL Maxcine Ham;  Surgeon: Elam Dutch, MD;  Location: [facility] CATH LAB;  Service: Cardiovascular;  [name];  . ABIs  04/27/2012   mild bilateral arterial insufficiency  . BACK SURGERY  2005 x1   X2-2010  . BUNIONECTOMY Right 11/11/2013   Procedure: RIGHT FOOT  SILVER BUNIONECTOMY;  Surgeon: Wylene Simmer, MD;  Location: [facility];  Service: Orthopedics;  Laterality: Right;  . CARDIAC CATHETERIZATION  12/11/2001   significant 3V CAD, normal LV function  . Carotid Doppler  04/27/2012   right internal carotid: Elevated velocities but no evidence of plaque. Left internal carotid 40-59%  . CAROTID ENDARTERECTOMY  2005   Right; recent carotid Dopplers notes elevated velocities.   . colonscopy    . CORONARY ANGIOPLASTY  12/21/2001   PTCA of the distal and mid AV groove circ, unsuccessful PTCA of second OM total occlusion, unsuccessful PTCA of the apical LAD total occlusion  . CORONARY ANGIOPLASTY WITH STENT PLACEMENT  04/29/12   PCI to 3 RCA lesions, Promus Premiere 2.110mx8mm distally, mid was 2.563m8mm and proximally 2.75x1620mEF 55-60%  . CORONARY STENT PLACEMENT  04/28/12   PCI to LAD (3x23m67mence DES postdilated to 3.25) and circ prox and mid (2 overlappinmg 2.5mmx22mmXience DES postdilated to 2.75mm)65mDOPPLER ECHOCARDIOGRAPHY  04/28/2012   poor quality study: EF estimated 60-65%; unable to assess diastolic function (previously noted to have diastolic dysfunction); severely dilated left atrium and mild right atrium; dilated IVC consistent with increased central venous pressure..  . HERNIA REPAIR    . LEFT AND RIGHT HEART CATHETERIZATION WITH CORONARY ANGIOGRAM N/A 04/27/2012   Procedure: LEFT AND RIGHT HEART CATHETERIZATION WITH CORONARY ANGIOGRAM;  Surgeon: David Leonie Man [facility];  Service: Cardiovascular;  [name];  . LEFT AND RIGHT HEART CATHETERIZATION WITH CORONARY ANGIOGRAM N/A 05/14/2013   Procedure: LEFT AND RIGHT HEART CATHETERIZATION WITH CORONARY ANGIOGRAM;  Surgeon: ThomasTroy Sine [facility]: Moderate Pulm HTN: 46/16 - mean 33 mmHg; PCWP 22mmHg32[name], [name], Patent RCA ostial mid and  distal stents - 70% distal runoff Dz  . LUMBAR LAMINECTOMY/DECOMPRESSION MICRODISCECTOMY  01/07/2012   Procedure: LUMBAR LAMINECTOMY/DECOMPRESSION MICRODISCECTOMY 1 LEVEL;  Surgeon: Kyle L Winfield CunasLocation: [facility]UR[facility];  Service: Neurosurgery;  Laterality: Bilateral;   Lumbar Three-Four Decompression  . PERCUTANEOUS CORONARY STENT INTERVENTION (PCI-S) N/A 04/28/2012   Procedure: PERCUTANEOUS CORONARY STENT INTERVENTION (PCI-S);  Surgeon: Thomas Troy Sine[facility];  Service: Cardiovascular;  [name];  . PERCUTANEOUS CORONARY STENT INTERVENTION (PCI-S) N/A 04/29/2012   Procedure: PERCUTANEOUS CORONARY STENT INTERVENTION (PCI-S);  Surgeon: David WLeonie ManLocation[facility];  Service: Cardiovascular;  [name];  . SPINE SURGERY    . UMBILICAL HERNIA REPAIR  2009   steel mesh insert        Home Medications    Prior to Admission medications   Medication Sig Start Date End Date Taking? Authorizing Provider  allopurinol (ZYLOPRIM) 100 MG tablet Take 100 mg by mouth daily. 08/20/17  Yes [provider]  aspirin EC 81 MG tablet Take 1 tablet (81 mg total) by mouth daily. 06/19/17  Yes HardingLeonie Man  MD  carvedilol (COREG) 6.25 MG tablet TAKE 1 TABLET (6.25 MG TOTAL) BY MOUTH 2 (TWO) TIMES DAILY. 03/25/17  Yes Leonie Man, MD  colchicine 0.6 MG tablet TAKE 1 TABLET BY MOUTH DAILY 08/20/17  Yes [provider]  doxycycline (VIBRA-TABS) 100 MG tablet Take 1 tablet (100 mg total) by mouth 2 (two) times daily for 10 days. 08/19/17 08/29/17 Yes Betancourt, Otila Kluver A, NP  escitalopram (LEXAPRO) 20 MG tablet TAKE 1 TABLET (20 MG TOTAL) BY MOUTH DAILY. 03/15/16  Yes Jeffery, Chelle, PA-C  insulin detemir (LEVEMIR) 100 unit/ml SOLN Inject 80 Units into the skin 2 (two) times daily.   Yes [provider]  levothyroxine (SYNTHROID, LEVOTHROID) 88 MCG tablet Take 88 mcg by mouth daily before breakfast.   Yes [provider]  metolazone  (ZAROXOLYN) 10 MG tablet Take 10 mg by mouth daily. 08/18/17  Yes [provider]  NOVOLOG FLEXPEN 100 UNIT/ML FlexPen INJECT 40 UNITS WITH MEALS 3 TIMES DAILY SUBCUTANEOUSLY 06/10/17  Yes [provider]  omeprazole (PRILOSEC) 20 MG capsule Take 20 mg by mouth daily.   Yes [provider]  rivaroxaban (XARELTO) 2.5 MG TABS tablet Take 1 tablet (2.5 mg total) by mouth 2 (two) times daily. 06/19/17  Yes Leonie Man, MD  rosuvastatin (CRESTOR) 10 MG tablet Take 10 mg by mouth daily.   Yes [provider]  telmisartan (MICARDIS) 80 MG tablet Take 80 mg by mouth daily. 03/21/16  Yes [provider]  albuterol (PROVENTIL HFA;VENTOLIN HFA) 108 (90 Base) MCG/ACT inhaler Inhale 1-2 puffs into the lungs every 4 (four) hours as needed for wheezing or shortness of breath. 07/03/17   Betancourt, Tina A, NP  NITROSTAT 0.4 MG SL tablet PLACE 1 TAB UNDER THE TONGUE EVERY 5 MINUTES AS NEEDED FOR CHEST PAIN (SEVERE PRESSURE OR TIGHTNESS) 03/24/14   Leonie Man, MD  TRULICITY 1.5 OH/6.0VP SOPN Inject 1.5 mg into the skin once a week. 02/17/16   [provider]  insulin lispro (HUMALOG) 100 UNIT/ML injection Inject 25 Units into the skin 3 (three) times daily before meals.  04/29/11  [provider]    Family History Family History  Adopted: Yes  Family history unknown: Yes    Social History Social History   Tobacco Use  . Smoking status: Former Smoker    Packs/day: 1.00    Years: 42.00    Pack years: 42.00    Types: Cigarettes    Last attempt to quit: 03/23/2003    Years since quitting: 14.4  . Smokeless tobacco: Never Used  Substance Use Topics  . Alcohol use: No    Alcohol/week: 0.0 oz  . Drug use: No     Allergies   Septra [bactrim]; Penicillins; Clopidogrel; Metformin and related; and Other   Review of Systems Review of Systems  All other systems reviewed and are negative.    Physical Exam Updated Vital Signs BP (!) 107/91    Pulse 71   Temp (!) 97.5 F (36.4 C) (Oral)   Resp 11   Ht _0  (1.854 m)   Wt (!) 149.7 kg (330 lb)   SpO2 97%   BMI 43.54 kg/m   Physical Exam  Constitutional: He is oriented to person, place, and time. He appears well-developed. No distress.  Overweight  HENT:  Head: Normocephalic and atraumatic.  Right Ear: External ear normal.  Left Ear: External ear normal.  Eyes: Pupils are equal, round, and reactive to light. Conjunctivae and EOM are  normal.  Neck: Normal range of motion and phonation normal. Neck supple.  Cardiovascular: Normal rate, regular rhythm and normal heart sounds.  Pulmonary/Chest: Effort normal and breath sounds normal. No respiratory distress. He has no wheezes. He exhibits no bony tenderness.  Musculoskeletal: Normal range of motion.  Neurological: He is alert and oriented to person, place, and time. No cranial nerve deficit or sensory deficit. He exhibits normal muscle tone. Coordination normal.  Skin: Skin is warm, dry and intact.  Psychiatric: He has a normal mood and affect. His behavior is normal. Judgment and thought content normal.  Nursing note and vitals reviewed.    ED Treatments / Results  Labs (all labs ordered are listed, but only abnormal results are displayed) Labs Reviewed - No data to display  EKG None  Radiology No results found.  Procedures Procedures (including critical care time)  Medications Ordered in ED Medications - No data to display   Initial Impression / Assessment and Plan / ED Course  I have reviewed the triage vital signs and the nursing notes.  Pertinent labs & imaging results that were available during my care of the patient were reviewed by me and considered in my medical decision making (see chart for details).          Medical Decision Making:   Patient Vitals for the past 24 hrs:  BP Temp Temp src Pulse Resp SpO2 Height Weight  08/25/17 2145 (!) 107/91 - - 71 11 97 % - -  08/25/17 2100 (!)  115/56 - - 67 14 97 % - -  08/25/17 2059 (!) 115/56 - - 69 10 97 % - -  08/25/17 1954 (!) 145/66 - - 66 14 99 % - -  08/25/17 1806 135/69 (!) 97.5 F (36.4 C) Oral 71 16 94 % 6' 1" (1.854 m) (!) 149.7 kg (330 lb)    10:06 PM Reevaluation with update and discussion. After initial assessment and treatment, an updated evaluation reveals he has rested comfortably here.  He denies orthopnea, while supine.  He has been able to urinate here.  Findings discussed and questions answered. Daleen Bo   Medical Decision Making: Azotemia, prerenal, secondary to diuresis.  Potassium normal.  Patient appears to be somewhat over diuresed but has successfully lost 20 pounds which was the goal.  Case was discussed with nephrology who recommended Lasix, for 2 days and follow-up with his personal nephrologist for discussion about further treatment and restarting diuresis.  CRITICAL CARE-no Performed by: Daleen Bo   Nursing Notes Reviewed/ Care Coordinated Applicable Imaging Reviewed Interpretation of Laboratory Data incorporated into ED treatment  The patient appears reasonably screened and/or stabilized for discharge and I doubt any other medical condition or other [facility] requiring further screening, evaluation, or treatment in the ED at this time prior to discharge.  Plan: Home Medications-hold Lasix for now, continue others; Home Treatments-low-salt diet; return here if the recommended treatment, does not improve the symptoms; Recommended follow up-nephrology follow-up 2 days for recheck, discussion of diuresis treatment.    Final Clinical Impressions(s) / ED Diagnoses   Final diagnoses:  Azotemia  Renal insufficiency    ED Discharge Orders    None       Daleen Bo, MD 08/25/17 2208

## 2017-08-25 NOTE — Discharge Instructions (Addendum)
Stop taking the Lasix, for now.  You may be able to restart it, Wednesday morning however you should check with your nephrologist, first.  Continue to stay on a low-salt diet.

## 2017-08-25 NOTE — ED Triage Notes (Signed)
Patient saw his PCP today and BUN and creatinine were elevated. Patient was instructed to come to the ED.

## 2017-09-01 ENCOUNTER — Ambulatory Visit: Payer: Self-pay | Admitting: *Deleted

## 2017-09-01 VITALS — BP 143/78 | HR 73 | Wt 329.0 lb

## 2017-09-01 DIAGNOSIS — I10 Essential (primary) hypertension: Secondary | ICD-10-CM

## 2017-09-01 DIAGNOSIS — Z794 Long term (current) use of insulin: Secondary | ICD-10-CM

## 2017-09-01 DIAGNOSIS — N289 Disorder of kidney and ureter, unspecified: Secondary | ICD-10-CM

## 2017-09-01 DIAGNOSIS — E118 Type 2 diabetes mellitus with unspecified complications: Secondary | ICD-10-CM

## 2017-09-01 NOTE — Progress Notes (Signed)
After seeing Nephrology on 08/18/17 and starting Lasix 112m TID and Metolozone, he began feeling worse each day over the week. Saw PCP on 08/25/17, labs showed acute kidney injury/failure with BUN 100, Creatinine 2.9, GFR 23.4, Na 130, +protein in ua dip. Was sent to ED that day. D/c home with Azotemia and Renal Insufficiency with instructions to f/u with nephrology for further plan after skipping few days of Lasix. Saw Nephro again 08/26/17. D/c'd Metolazone and restarted Lasix gradually increasing dose up to 1616mtoday and tomorrow. Telmisartan on hold until f/u. Then scheduled for additional f/u tomorrow with Nephro.  Generally feeling better today after being out all last week on medical leave. Leg swelling significantly reduced. Still with decreased appetite and some nausea. Struggling to control blood sugars due to nausea, appetite and medication changes. Monitoring levels often though to assess for highs and lows. Currently 221. Wt at Nephro office 7/9 was 326lb. Highest recent wt was 353lb just before starting Metolazone. Going to compare wt at office tomorrow and if large shift from today, reweigh in clinic tomorrow to compare scale differences.

## 2017-09-05 ENCOUNTER — Ambulatory Visit: Payer: Self-pay | Admitting: *Deleted

## 2017-09-05 VITALS — BP 133/68 | HR 71 | Wt 331.0 lb

## 2017-09-05 DIAGNOSIS — I10 Essential (primary) hypertension: Secondary | ICD-10-CM

## 2017-09-05 DIAGNOSIS — N289 Disorder of kidney and ureter, unspecified: Secondary | ICD-10-CM

## 2017-09-05 DIAGNOSIS — Z794 Long term (current) use of insulin: Secondary | ICD-10-CM

## 2017-09-05 DIAGNOSIS — E118 Type 2 diabetes mellitus with unspecified complications: Secondary | ICD-10-CM

## 2017-09-05 NOTE — Progress Notes (Signed)
CBG 253. Insulin 34mn pta to clinic. Has [facility] from Dt Coke to [facility] for clear liquids. A friend of his told him that dark drinks such as Diet Coke contain phosphorus that could be contributing to his joint pain from arthritis from the phosphorus building up in his joints. He reports less pain since switching to these.  Nephro clinic RN visit 7/16. Orthostatic VS. BP dropped on standing per pt. Labs drawn. Has not heard back about results. Scheduled to rtc Wednesday 7/24. Still with reduced BLE edema, trace now. Feels less bloated and ShOB. Almost back to baseline for walking distance capabilities (parking lot to workstation without having to stop-still winded as per his usual-but no 1-2 stops to catch breath like he was doing for the past 3-4 weeks).

## 2017-09-09 ENCOUNTER — Ambulatory Visit: Payer: Self-pay | Admitting: *Deleted

## 2017-09-09 VITALS — BP 146/75 | HR 61 | Wt 334.0 lb

## 2017-09-09 DIAGNOSIS — I10 Essential (primary) hypertension: Secondary | ICD-10-CM

## 2017-09-09 DIAGNOSIS — N289 Disorder of kidney and ureter, unspecified: Secondary | ICD-10-CM

## 2017-09-09 DIAGNOSIS — E118 Type 2 diabetes mellitus with unspecified complications: Secondary | ICD-10-CM

## 2017-09-09 DIAGNOSIS — Z794 Long term (current) use of insulin: Secondary | ICD-10-CM

## 2017-09-09 DIAGNOSIS — R600 Localized edema: Secondary | ICD-10-CM

## 2017-09-09 NOTE — Progress Notes (Signed)
Pt returned to nephrologist yesterday. Kidney function improved between 7/8 and 7/15 labs. BUN from 100 to 58. Creatinine from 2.9 to 2.21. GFR from 23 to 30. Na+ remains low at 130. Pt reports they did not discuss Na+ with him yesterday. Waiting on results for 7/22 labs. Advised him if it remains low on those results, to ask what their minimum level is and for any recommendations r/t salt use/Gatorade/diet supplementation. He is set to return in one month to nephro.   Wt in office yesterday was 334.8#. Same here in clinic today. He is back up to Lasix 270m BID. No Mometazone. Reports mild BLE edema worse by end of day after sitting at desk all workday. Improved in the late evenings after legs elevated at home.

## 2017-09-10 ENCOUNTER — Other Ambulatory Visit: Payer: Self-pay | Admitting: Cardiology

## 2017-09-10 NOTE — Telephone Encounter (Signed)
Rx sent to pharmacy

## 2017-09-12 ENCOUNTER — Ambulatory Visit: Payer: Self-pay | Admitting: *Deleted

## 2017-09-12 VITALS — BP 116/72 | HR 62 | Wt 336.0 lb

## 2017-09-12 DIAGNOSIS — N289 Disorder of kidney and ureter, unspecified: Secondary | ICD-10-CM

## 2017-09-12 DIAGNOSIS — I10 Essential (primary) hypertension: Secondary | ICD-10-CM

## 2017-09-12 DIAGNOSIS — R600 Localized edema: Secondary | ICD-10-CM

## 2017-09-12 DIAGNOSIS — Z794 Long term (current) use of insulin: Secondary | ICD-10-CM

## 2017-09-12 DIAGNOSIS — E118 Type 2 diabetes mellitus with unspecified complications: Secondary | ICD-10-CM

## 2017-09-12 NOTE — Progress Notes (Signed)
Pt brings lab results from 09/08/17 nephro OV. Labs continue to improve. BUN/Creat/GFR similar to July 2018 values. BUN 27, Creatinine 1.53, GFR nonAA 47. Hyponatremia resolved now. Na+ 139. K+4.0. Glucose improved as well. Down to 210. Pt reports he has had more readings in the 100's over the past week. Has been taking extra insulin injection per his sliding scale anytime his reading is above 200.   BP WNL 116/72, p62. Wt is up 2# since Monday (4 days). Pt reports BLE edema worse through day, resolved in the morning after sleeping in bed. Edema currently 1-2+ in BLE. Sts ShOB slightly worsened over past few days. Already taking 285m Lasix BID. Concerned with taking more or having to restart anything additional such as Mometasone due to losing the fluid so quickly over past 2 weeks resulting in acute kidney injury. Advised pt stand at least 2 min once an hour while sitting at his workstation over 8 hour shift and stretch legs. Also perform calf raises, toe raises often while sitting to open promote blood flow. Watch for wt gain, ShOB, edema, or any combination of sx that would require earlier than scheduled one month f/u with nephrologist.Pt agreeable to plan of care and has no further questions/concerns.

## 2017-09-15 ENCOUNTER — Ambulatory Visit: Payer: Self-pay | Admitting: *Deleted

## 2017-09-15 VITALS — BP 125/83 | HR 62 | Wt 332.0 lb

## 2017-09-15 DIAGNOSIS — I10 Essential (primary) hypertension: Secondary | ICD-10-CM

## 2017-09-15 DIAGNOSIS — Z794 Long term (current) use of insulin: Secondary | ICD-10-CM

## 2017-09-15 DIAGNOSIS — E118 Type 2 diabetes mellitus with unspecified complications: Secondary | ICD-10-CM

## 2017-09-15 DIAGNOSIS — N289 Disorder of kidney and ureter, unspecified: Secondary | ICD-10-CM

## 2017-09-15 NOTE — Progress Notes (Signed)
Pt reports more fatigue today and yesterday than over the past week. BLE edema improved from last week. Does not feel ShOB has worsened with fatigue. Wt down 4# since Friday.   Glucose has been elevated even without po intake. Sts it was almost 200 this am then 250 two hours later without any food or drink. 229 one hr after injections, and 162 currently (2-3hrs after injections) after one cup Dt. Mt Dew, no food.

## 2017-09-19 ENCOUNTER — Ambulatory Visit: Payer: Self-pay | Admitting: *Deleted

## 2017-09-19 VITALS — BP 140/80 | HR 66 | Wt 329.0 lb

## 2017-09-19 DIAGNOSIS — I10 Essential (primary) hypertension: Secondary | ICD-10-CM

## 2017-09-19 DIAGNOSIS — E118 Type 2 diabetes mellitus with unspecified complications: Secondary | ICD-10-CM

## 2017-09-19 DIAGNOSIS — N289 Disorder of kidney and ureter, unspecified: Secondary | ICD-10-CM

## 2017-09-19 DIAGNOSIS — Z794 Long term (current) use of insulin: Secondary | ICD-10-CM

## 2017-09-19 NOTE — Progress Notes (Signed)
Pt brings labs from most recent MD visit. 09/16/17 kidney function improved from 7/22. BUN 21, Creat 1.5, GFR 48. K+ low 3.3, Supplement was Rx'd. He has not started this yet. A1c 8.8. Returns in 6 wks for recheck.  Wt down 3# since Monday (332#). Reported 333# in office Tuesday. Now 329#. Baseline ShOB, BLE edema. Only remaining sx is fatigue which he attributes to acute kidney injury and is hoping will improve with time as expected by his nephrologist.

## 2017-09-22 ENCOUNTER — Ambulatory Visit: Payer: Self-pay | Admitting: *Deleted

## 2017-09-22 VITALS — BP 143/82 | HR 63 | Wt 332.0 lb

## 2017-09-22 DIAGNOSIS — N289 Disorder of kidney and ureter, unspecified: Secondary | ICD-10-CM

## 2017-09-22 DIAGNOSIS — Z794 Long term (current) use of insulin: Secondary | ICD-10-CM

## 2017-09-22 DIAGNOSIS — I10 Essential (primary) hypertension: Secondary | ICD-10-CM

## 2017-09-22 DIAGNOSIS — E118 Type 2 diabetes mellitus with unspecified complications: Secondary | ICD-10-CM

## 2017-09-22 NOTE — Progress Notes (Signed)
CBG now 190. 220 upon waking today. Still with reduced energy. 3# wt gain over the weekend.

## 2017-09-26 ENCOUNTER — Ambulatory Visit: Payer: Self-pay | Admitting: *Deleted

## 2017-09-26 VITALS — BP 133/77 | HR 68 | Wt 329.0 lb

## 2017-09-26 DIAGNOSIS — I10 Essential (primary) hypertension: Secondary | ICD-10-CM

## 2017-09-26 DIAGNOSIS — Z794 Long term (current) use of insulin: Principal | ICD-10-CM

## 2017-09-26 DIAGNOSIS — E118 Type 2 diabetes mellitus with unspecified complications: Secondary | ICD-10-CM

## 2017-09-26 NOTE — Progress Notes (Signed)
CBG running lower on average than the past few weeks. 95-180 most reads in past week with few 200's, no 300's. Currently 153.

## 2017-09-29 ENCOUNTER — Ambulatory Visit: Payer: Self-pay | Admitting: *Deleted

## 2017-09-29 VITALS — BP 136/78 | HR 63 | Wt 330.0 lb

## 2017-09-29 DIAGNOSIS — I10 Essential (primary) hypertension: Secondary | ICD-10-CM

## 2017-09-29 DIAGNOSIS — Z794 Long term (current) use of insulin: Principal | ICD-10-CM

## 2017-09-29 DIAGNOSIS — E118 Type 2 diabetes mellitus with unspecified complications: Secondary | ICD-10-CM

## 2017-09-29 NOTE — Progress Notes (Signed)
Fatigue and overall energy improving over this past weekend. Able to walk in from parking lot to workstation without stopping. Completed grocery shopping trip yesterday without difficulty.  CBGs averaging lower - 70's-130's this weekend. Highest reading was 250. Lowest 62 with slight symptoms of diaphoresis, lightheadedness but only required 1 pack of crackers to correct and resolve sx.   Refill request of Omeprazole and Levothyroxine processed through [facility] stock and paper chart.

## 2017-10-03 ENCOUNTER — Ambulatory Visit: Payer: Self-pay | Admitting: *Deleted

## 2017-10-03 VITALS — BP 137/78 | HR 61 | Wt 327.0 lb

## 2017-10-03 DIAGNOSIS — I10 Essential (primary) hypertension: Secondary | ICD-10-CM

## 2017-10-03 DIAGNOSIS — Z794 Long term (current) use of insulin: Principal | ICD-10-CM

## 2017-10-03 DIAGNOSIS — E118 Type 2 diabetes mellitus with unspecified complications: Secondary | ICD-10-CM

## 2017-10-03 NOTE — Progress Notes (Signed)
Weekly CBGs averaging more in the 80's-150's. Some spikes in the 200-250's in the last 24hrs. He reports treating these with extra insulin based on sliding scale as soon as he sees the higher reading >200.  Returns to nephrology for repeat labs and f/u next week.

## 2017-10-06 ENCOUNTER — Ambulatory Visit: Payer: Self-pay | Admitting: *Deleted

## 2017-10-06 VITALS — BP 157/82 | HR 63 | Wt 329.0 lb

## 2017-10-06 DIAGNOSIS — I10 Essential (primary) hypertension: Secondary | ICD-10-CM

## 2017-10-06 DIAGNOSIS — E118 Type 2 diabetes mellitus with unspecified complications: Secondary | ICD-10-CM

## 2017-10-06 DIAGNOSIS — Z794 Long term (current) use of insulin: Principal | ICD-10-CM

## 2017-10-06 NOTE — Progress Notes (Signed)
CBGs over the weekend averaging higher than in past 2-3 weeks. 150-250 with spikes to 275. Still dosing anything over 200 with insulin on sliding scale.  Nephro labs and f/u 8/27.

## 2017-10-10 ENCOUNTER — Ambulatory Visit: Payer: Self-pay | Admitting: *Deleted

## 2017-10-10 VITALS — BP 142/85 | HR 65 | Wt 327.0 lb

## 2017-10-10 DIAGNOSIS — Z794 Long term (current) use of insulin: Principal | ICD-10-CM

## 2017-10-10 DIAGNOSIS — I10 Essential (primary) hypertension: Secondary | ICD-10-CM

## 2017-10-10 DIAGNOSIS — E118 Type 2 diabetes mellitus with unspecified complications: Secondary | ICD-10-CM

## 2017-10-10 NOTE — Progress Notes (Signed)
Pt with log from [facility]. 2 days of late afternoon hypoglycemia. Took insulin shots (40u Novolog, 80u Lantus) 1-2 minutes before eating 2 slices bread with peanut butter and cbg dropped to 50's one day and 40's the next day. Sx included "fogginess, just not right" the first day that prompted him to check level. The second day, coworker reported to him that he had become pale, sweaty and was shaky. Was able to correct low sugar by finishing meal and eating additional crackers. Sugars up to 115-150 after this and stable until now. The next day, he took shots while eating instead of immediately before and also ate an [place] sub sandwich for lunch instead of the peanut butter toast. Has not had lunch yet today. CBG currently 109. Feels well. Other readings this week variable, but all under 265, which pt feels is a vast improvement for him considering he usually has at least a couple of 300's and high 200's.

## 2017-10-13 ENCOUNTER — Ambulatory Visit: Payer: Self-pay | Admitting: *Deleted

## 2017-10-13 VITALS — BP 160/86 | HR 65 | Wt 327.0 lb

## 2017-10-13 DIAGNOSIS — I10 Essential (primary) hypertension: Secondary | ICD-10-CM

## 2017-10-13 DIAGNOSIS — E118 Type 2 diabetes mellitus with unspecified complications: Secondary | ICD-10-CM

## 2017-10-13 DIAGNOSIS — Z794 Long term (current) use of insulin: Principal | ICD-10-CM

## 2017-10-13 NOTE — Progress Notes (Signed)
CBGs still variable but more condensed to 130 to 200 on average with 5-6 readings above 200 over the weekend.

## 2017-10-17 ENCOUNTER — Ambulatory Visit: Payer: Self-pay | Admitting: *Deleted

## 2017-10-17 VITALS — BP 146/81 | HR 60 | Wt 326.0 lb

## 2017-10-17 DIAGNOSIS — E118 Type 2 diabetes mellitus with unspecified complications: Secondary | ICD-10-CM

## 2017-10-17 DIAGNOSIS — I10 Essential (primary) hypertension: Secondary | ICD-10-CM

## 2017-10-17 DIAGNOSIS — Z794 Long term (current) use of insulin: Secondary | ICD-10-CM

## 2017-10-17 DIAGNOSIS — N289 Disorder of kidney and ureter, unspecified: Secondary | ICD-10-CM

## 2017-10-17 NOTE — Progress Notes (Signed)
CBGs avg 150-250 over past week per [facility] meter log review. None in 300's. Only a few <100 this week. Labs brought from MD drawn last week. Kidney function stable from 7/30 labs. Potassium replenished with po supplement, back up to 4.0. UA with trace ketones, previously neg, and +1 glucose, and +3 protein up from +1. Encouraged to hydrate well with water, as opposed to [place] and work on lowering CBGs a bit more. In previous weeks, has had many more readings in lower 100's. He doesn't believe nephro MD mentioned anything specific about UA results at appt.

## 2017-10-24 ENCOUNTER — Ambulatory Visit: Payer: Self-pay | Admitting: *Deleted

## 2017-10-24 VITALS — BP 137/80 | HR 61 | Wt 329.0 lb

## 2017-10-24 DIAGNOSIS — E118 Type 2 diabetes mellitus with unspecified complications: Secondary | ICD-10-CM

## 2017-10-24 DIAGNOSIS — Z794 Long term (current) use of insulin: Principal | ICD-10-CM

## 2017-10-24 DIAGNOSIS — I10 Essential (primary) hypertension: Secondary | ICD-10-CM

## 2017-10-24 NOTE — Progress Notes (Signed)
Pt brings [employer]. 55% in target range (70-180) 44% above target, 1% low (2 drops in month) Avg glucose 172 for August 2019. Pt believes July avg was in 180's.  Last dose Trulicity 07/24/89. Discussed with DM2 manager that he feels it isn't helping with all of the other insulins and meds and would like to trial stopping it. If glucose rises consistently over next few weeks and is unable to lower it in the same manner he has been able to manage in the past month, then they will re-discuss restarting it.

## 2017-10-27 ENCOUNTER — Ambulatory Visit: Payer: Self-pay | Admitting: *Deleted

## 2017-10-27 VITALS — BP 132/78 | HR 62 | Wt 329.0 lb

## 2017-10-27 DIAGNOSIS — I10 Essential (primary) hypertension: Secondary | ICD-10-CM

## 2017-10-27 DIAGNOSIS — E118 Type 2 diabetes mellitus with unspecified complications: Secondary | ICD-10-CM

## 2017-10-27 DIAGNOSIS — Z794 Long term (current) use of insulin: Principal | ICD-10-CM

## 2017-11-03 ENCOUNTER — Ambulatory Visit: Payer: Self-pay | Admitting: *Deleted

## 2017-11-03 VITALS — BP 140/84 | HR 64 | Wt 330.0 lb

## 2017-11-03 DIAGNOSIS — Z794 Long term (current) use of insulin: Principal | ICD-10-CM

## 2017-11-03 DIAGNOSIS — E118 Type 2 diabetes mellitus with unspecified complications: Secondary | ICD-10-CM

## 2017-11-03 DIAGNOSIS — I10 Essential (primary) hypertension: Secondary | ICD-10-CM

## 2017-11-03 NOTE — Progress Notes (Signed)
Pt reports since stopping his weekly Trulicity, his cbgs have been increased. Averaging much higher, 280-320 over the past week (when he did not take what would have been his Sept 8th weekly dose), compared to 180-200 the prior week (last dose Sept 1st).   Pt reports increase in fatigue over the past week. Also increase in abd cramping, loose BMs with urgency. Stomach issues were the most frequent reasoning for his having to call out on FMLA when his sugars were more out of control. Since being more controlled over the past 9 months, he reports BMs on avg once daily, down from 3-4x daily previously. Over the last week, stomach cramping and diarrhea has increased again to 3-4x day, correlating with increase in cbgs.  He is going to call endocrinologist today to see if she wants him to restart Trulicity (vs something else) and when.

## 2017-11-10 ENCOUNTER — Ambulatory Visit: Payer: Self-pay | Admitting: *Deleted

## 2017-11-10 VITALS — BP 166/82 | HR 62 | Wt 330.0 lb

## 2017-11-10 DIAGNOSIS — I10 Essential (primary) hypertension: Secondary | ICD-10-CM

## 2017-11-10 DIAGNOSIS — Z794 Long term (current) use of insulin: Principal | ICD-10-CM

## 2017-11-10 DIAGNOSIS — E118 Type 2 diabetes mellitus with unspecified complications: Secondary | ICD-10-CM

## 2017-11-10 NOTE — Progress Notes (Signed)
CBGs improving gradually now that he is back on Trulicity. Took first dose since restarting on 9/17, then 9/23 for 2nd dose. Now averaging 200-250 with a few 250-300. None over 300 in past week.   Worry over letter he received from Rx supplier stating eval of meds/insulin being initiated to determine if Novolog and Lantus are medically necessary/indicated. Pt to inform endocrinology of letter this week and call Rx company to have process started now if possible to avoid delays in receiving meds in 3 months.

## 2017-11-14 ENCOUNTER — Ambulatory Visit: Payer: Self-pay | Admitting: *Deleted

## 2017-11-14 VITALS — BP 149/78 | HR 64 | Wt 328.0 lb

## 2017-11-14 DIAGNOSIS — I10 Essential (primary) hypertension: Secondary | ICD-10-CM

## 2017-11-14 DIAGNOSIS — Z794 Long term (current) use of insulin: Principal | ICD-10-CM

## 2017-11-14 DIAGNOSIS — E118 Type 2 diabetes mellitus with unspecified complications: Secondary | ICD-10-CM

## 2017-11-17 ENCOUNTER — Ambulatory Visit: Payer: Self-pay | Admitting: *Deleted

## 2017-11-17 VITALS — BP 142/86 | HR 63 | Wt 330.0 lb

## 2017-11-17 DIAGNOSIS — I10 Essential (primary) hypertension: Secondary | ICD-10-CM

## 2017-11-17 DIAGNOSIS — Z794 Long term (current) use of insulin: Principal | ICD-10-CM

## 2017-11-17 DIAGNOSIS — E118 Type 2 diabetes mellitus with unspecified complications: Secondary | ICD-10-CM

## 2017-11-17 NOTE — Progress Notes (Signed)
CBGs have been averaging 200-300 for past week per pt. Slightly higher than prior to stopping Trulicity. Has been back on it for 3 doses now.

## 2017-11-24 ENCOUNTER — Ambulatory Visit: Payer: Self-pay | Admitting: *Deleted

## 2017-11-24 VITALS — BP 135/78 | HR 67 | Wt 325.0 lb

## 2017-11-24 DIAGNOSIS — E118 Type 2 diabetes mellitus with unspecified complications: Secondary | ICD-10-CM

## 2017-11-24 DIAGNOSIS — I10 Essential (primary) hypertension: Secondary | ICD-10-CM

## 2017-11-24 DIAGNOSIS — Z794 Long term (current) use of insulin: Principal | ICD-10-CM

## 2017-11-28 ENCOUNTER — Ambulatory Visit: Payer: Self-pay | Admitting: *Deleted

## 2017-11-28 VITALS — BP 134/80 | HR 62 | Wt 326.0 lb

## 2017-11-28 DIAGNOSIS — I10 Essential (primary) hypertension: Secondary | ICD-10-CM

## 2017-11-28 DIAGNOSIS — Z794 Long term (current) use of insulin: Principal | ICD-10-CM

## 2017-11-28 DIAGNOSIS — E118 Type 2 diabetes mellitus with unspecified complications: Secondary | ICD-10-CM

## 2017-11-28 NOTE — Progress Notes (Signed)
CBGs still running elevated, mid to high 200's. Pt endorses some diet indiscretions but doesn't believe they have occurred often enough to cause the levels he has seen for the past 3-4 weeks, averaging in the 200's, whereas he was able to maintain in the mid to high 100's with only a few into the 200's prior to one month ago. Has recently seen Endocrinologist with no major changes in meds. Is supposed to f/u again in one month.

## 2017-12-05 ENCOUNTER — Ambulatory Visit: Payer: Self-pay | Admitting: *Deleted

## 2017-12-05 VITALS — BP 140/79 | HR 66 | Temp 97.2°F | Wt 328.0 lb

## 2017-12-05 DIAGNOSIS — I10 Essential (primary) hypertension: Secondary | ICD-10-CM

## 2017-12-05 DIAGNOSIS — E118 Type 2 diabetes mellitus with unspecified complications: Secondary | ICD-10-CM

## 2017-12-05 DIAGNOSIS — Z794 Long term (current) use of insulin: Principal | ICD-10-CM

## 2017-12-05 NOTE — Progress Notes (Signed)
CBGs still high. Averaging 250 over past week. Currently 160.   Nephro f/u next week. Scheduled for endocrinology f/u in November but planning to call them next week to advise that sugar has been much higher than previously when he was taking Invokana and to see if he can get an earlier appt.

## 2017-12-08 ENCOUNTER — Ambulatory Visit: Payer: PRIVATE HEALTH INSURANCE | Admitting: Adult Health

## 2017-12-08 ENCOUNTER — Ambulatory Visit: Payer: Self-pay | Admitting: *Deleted

## 2017-12-08 VITALS — BP 151/76 | HR 64 | Wt 328.0 lb

## 2017-12-08 DIAGNOSIS — E118 Type 2 diabetes mellitus with unspecified complications: Secondary | ICD-10-CM

## 2017-12-08 DIAGNOSIS — I10 Essential (primary) hypertension: Secondary | ICD-10-CM

## 2017-12-08 DIAGNOSIS — Z794 Long term (current) use of insulin: Principal | ICD-10-CM

## 2017-12-08 NOTE — Progress Notes (Signed)
Moved up endocrinology appt to this week from Nov since cbgs have been elevated. Currently 168. Was 350 upon waking this morning and took 60u Novolog. Next cbg was 148.

## 2017-12-12 ENCOUNTER — Ambulatory Visit: Payer: Self-pay | Admitting: *Deleted

## 2017-12-12 VITALS — BP 143/80 | HR 64 | Wt 328.0 lb

## 2017-12-12 DIAGNOSIS — Z794 Long term (current) use of insulin: Principal | ICD-10-CM

## 2017-12-12 DIAGNOSIS — I10 Essential (primary) hypertension: Secondary | ICD-10-CM

## 2017-12-12 DIAGNOSIS — E118 Type 2 diabetes mellitus with unspecified complications: Secondary | ICD-10-CM

## 2017-12-12 NOTE — Progress Notes (Signed)
Saw endocrinology this week. A1c was checked at 7.4.  Invokana was restarted at 131m daily.  Trulicity will be increasing to 1.524mSQ weekly in 2 weeks once current supply of 0.7533ms used up.  Pt received updated letter from insurance company-pharmacy plan in regards to previous letter stating that his Novolog would no longer be covered under insurance formulary. His MD filed appeal to keep him on same meds, and per pt new letter states that Novolog is still denied and insurance suggests he switch to Levemir despite this being a basal insulin, which is he already taking Lantus for. Pt is planning to take letter to MD to determine next steps.

## 2017-12-15 ENCOUNTER — Ambulatory Visit: Payer: Self-pay | Admitting: *Deleted

## 2017-12-15 VITALS — BP 167/83 | HR 72 | Wt 326.0 lb

## 2017-12-15 DIAGNOSIS — E118 Type 2 diabetes mellitus with unspecified complications: Secondary | ICD-10-CM

## 2017-12-15 DIAGNOSIS — I10 Essential (primary) hypertension: Secondary | ICD-10-CM

## 2017-12-15 DIAGNOSIS — Z794 Long term (current) use of insulin: Principal | ICD-10-CM

## 2017-12-15 NOTE — Progress Notes (Signed)
Pt reports "stomach quesy, tired, headache." Feels similar to how he did when sugars were running higher last year and he was taking FMLA frequently for stomach issues of n/v/d. BG currently 365. Just took insulin just before arrival to clinic. He reports not enough time for it to work yet.

## 2017-12-19 ENCOUNTER — Ambulatory Visit: Payer: Self-pay | Admitting: *Deleted

## 2017-12-19 VITALS — BP 134/82 | HR 65 | Wt 324.0 lb

## 2017-12-19 DIAGNOSIS — I10 Essential (primary) hypertension: Secondary | ICD-10-CM

## 2017-12-19 DIAGNOSIS — E118 Type 2 diabetes mellitus with unspecified complications: Secondary | ICD-10-CM

## 2017-12-19 DIAGNOSIS — Z794 Long term (current) use of insulin: Principal | ICD-10-CM

## 2017-12-19 NOTE — Progress Notes (Signed)
Saw nephrologist 10/29. Coreg doubled to 12.86m BID for uncontrolled HTN as he discussed with MD.  BGs yo-yoing this week 100-350. Pt unsure why, denies major diet indiscretions. Was >300 upon waking yesterday morning and 126 waking this am. Sensor died this am. New one at home. Checked CBG, 182. Had breakfast and insulin shots around 11:00 this am.

## 2017-12-22 ENCOUNTER — Ambulatory Visit: Payer: Self-pay | Admitting: *Deleted

## 2017-12-22 VITALS — BP 157/80 | HR 59 | Wt 327.0 lb

## 2017-12-22 DIAGNOSIS — I10 Essential (primary) hypertension: Secondary | ICD-10-CM

## 2017-12-22 DIAGNOSIS — E118 Type 2 diabetes mellitus with unspecified complications: Secondary | ICD-10-CM

## 2017-12-22 DIAGNOSIS — Z794 Long term (current) use of insulin: Principal | ICD-10-CM

## 2017-12-22 NOTE — Progress Notes (Signed)
BGs high over weekend due to being in area that was not conducive to insulin injections. Was mid 48's by the time he got home, treated and was able to reduce down to similar to his recent readings. 130 currently. Feels they have been slowly improving again. Starts increased Trulicity dosing next Sunday.

## 2017-12-26 ENCOUNTER — Ambulatory Visit: Payer: Self-pay | Admitting: *Deleted

## 2017-12-26 VITALS — BP 140/70 | HR 61 | Wt 325.0 lb

## 2017-12-26 DIAGNOSIS — Z794 Long term (current) use of insulin: Principal | ICD-10-CM

## 2017-12-26 DIAGNOSIS — E118 Type 2 diabetes mellitus with unspecified complications: Secondary | ICD-10-CM

## 2017-12-26 DIAGNOSIS — I10 Essential (primary) hypertension: Secondary | ICD-10-CM

## 2017-12-26 NOTE — Progress Notes (Signed)
BGs improved this week. Majority 70-180. Each day has 1-2 readings in low 200's but quickly fixed. Labs from nephro: kidney func stable from Aug 2019. K+ 3.4, low again, supplement doubled.

## 2017-12-29 ENCOUNTER — Ambulatory Visit: Payer: Self-pay | Admitting: *Deleted

## 2017-12-29 VITALS — BP 140/78 | Wt 330.0 lb

## 2017-12-29 DIAGNOSIS — I10 Essential (primary) hypertension: Secondary | ICD-10-CM

## 2017-12-29 DIAGNOSIS — Z794 Long term (current) use of insulin: Principal | ICD-10-CM

## 2017-12-29 DIAGNOSIS — E118 Type 2 diabetes mellitus with unspecified complications: Secondary | ICD-10-CM

## 2018-01-01 ENCOUNTER — Ambulatory Visit: Payer: Self-pay | Admitting: Registered Nurse

## 2018-01-01 ENCOUNTER — Encounter: Payer: Self-pay | Admitting: Registered Nurse

## 2018-01-01 VITALS — BP 136/76 | HR 59 | Temp 97.0°F

## 2018-01-01 DIAGNOSIS — R21 Rash and other nonspecific skin eruption: Secondary | ICD-10-CM

## 2018-01-01 NOTE — Patient Instructions (Signed)
Testosterone topical solution What is this medicine? TESTOSTERONE (tes TOS ter one) is the main male hormone. It supports normal male traits such as muscle growth, facial hair, and deep voice. This medicine is used in males to treat low testosterone levels. This medicine may be used for other purposes; ask your health care provider or pharmacist if you have questions. COMMON BRAND NAME(S): AXIRON What should I tell my health care provider before I take this medicine? They need to know if you have any of these conditions: -breast cancer -breathing problems while you sleep (sleep apnea) -diabetes -heart disease -if a male partner is pregnant or trying to get pregnant -kidney disease -liver disease -lung disease Rash A rash is a change in the color of the skin. A rash can also change the way your skin feels. There are many different conditions and factors that can cause a rash. Follow these instructions at home: Pay attention to any changes in your symptoms. Follow these instructions to help with your condition: Medicine Take or apply over-the-counter and prescription medicines only as told by your health care provider. These may include:  Corticosteroid cream.  Anti-itch lotions.  Oral antihistamines.  Skin Care  Apply cool compresses to the affected areas.  Try taking a bath with: ? Epsom salts. Follow the instructions on the packaging. You can get these at your local pharmacy or grocery store. ? Baking soda. Pour a small amount into the bath as told by your health care provider. ? Colloidal oatmeal. Follow the instructions on the packaging. You can get this at your local pharmacy or grocery store.  Try applying baking soda paste to your skin. Stir water into baking soda until it reaches a paste-like consistency.  Do not scratch or rub your skin.  Avoid covering the rash. Make sure the rash is exposed to air as much as possible. General instructions  Avoid hot showers or  baths, which can make itching worse. A cold shower may help.  Avoid scented soaps, detergents, and perfumes. Use gentle soaps, detergents, perfumes, and other cosmetic products.  Avoid any substance that causes your rash. Keep a journal to help track what causes your rash. Write down: ? What you eat. ? What cosmetic products you use. ? What you drink. ? What you wear. This includes jewelry.  Keep all follow-up visits as told by your health care provider. This is important. Contact a health care provider if:  You sweat at night.  You lose weight.  You urinate more than normal.  You feel weak.  You vomit.  Your skin or the whites of your eyes look yellow (jaundice).  Your skin: ? Tingles. ? Is numb.  Your rash: ? Does not go away after several days. ? Gets worse.  You are: ? Unusually thirsty. ? More tired than normal.  You have: ? New symptoms. ? Pain in your abdomen. ? A fever. ? Diarrhea. Get help right away if:  You develop a rash that covers all or most of your body. The rash may or may not be painful.  You develop blisters that: ? Are on top of the rash. ? Grow larger or grow together. ? Are painful. ? Are inside your nose or mouth.  You develop a rash that: ? Looks like purple pinprick-sized spots all over your body. ? Has a "bull's eye" or looks like a target. ? Is not related to sun exposure, is red and painful, and causes your skin to peel. This information is  not intended to replace advice given to you by your health care provider. Make sure you discuss any questions you have with your health care provider. Document Released: 01/25/2002 Document Revised: 07/11/2015 Document Reviewed: 06/22/2014 Elsevier Interactive Patient Education  2018 [employer]. -prostate cancer, enlargement -an unusual or allergic reaction to testosterone, other medicines, foods, dyes, or preservatives -pregnant or trying to get pregnant -breast-feeding How should I use  this medicine? This medicine is applied at the same time every day (preferably in the morning) to clean, dry, intact skin of the armpit. Follow the directions on the prescription label. If you take a bath or shower in the morning, apply the medicine after the bath or shower. If you use deodorants or antiperspirants, use them at least 2 minutes before applying this medicine. Only apply this medicine to the armpits. Do not use on any other body part. To use, remove the cap and the applicator cup from the pump. Fully depress the pump once to dispense solution into the applicator cup. With the applicator cup held upright, wipe the solution down and up into the armpit. Do not use your fingers or hand to rub the medicine into the skin. Allow the skin to dry a few minutes before putting on clothing. The skin solution is flammable. Avoid fire, flame, or smoking until the solution has dried. Wash your hands after use. Avoid bathing or swimming for at least 2 hours after you apply the medicine. A special MedGuide will be given to you by the pharmacist with each prescription and refill. Be sure to read this information carefully each time. Talk to your pediatrician regarding the use of this medicine in children. Special care may be needed. Overdosage: If you think you have taken too much of this medicine contact a poison control center or emergency room at once. NOTE: This medicine is only for you. Do not share this medicine with others. What if I miss a dose? If you miss a dose, use it as soon as you can. If it is almost time for your next dose, use only that dose. Do not use double or extra doses. What may interact with this medicine? -certain medicines for diabetes -certain medicines that treat or prevent blood clots like warfarin -oxyphenbutazone -propranolol -steroid medicines like prednisone or cortisone This list may not describe all possible interactions. Give your health care provider a list of all the  medicines, herbs, non-prescription drugs, or dietary supplements you use. Also tell them if you smoke, drink alcohol, or use illegal drugs. Some items may interact with your medicine. What should I watch for while using this medicine? Visit your doctor or health care professional for regular checks on your progress. They will need to check the level of testosterone in your blood. This medicine is only approved for use in men who have low levels of testosterone related to certain medical conditions. Heart attacks and strokes have been reported with the use of this medicine. Notify your doctor or health care professional and seek emergency treatment if you develop breathing problems; changes in vision; confusion; chest pain or chest tightness; sudden arm pain; severe, sudden headache; trouble speaking or understanding; sudden numbness or weakness of the face, arm or leg; loss of balance or coordination. Talk to your doctor about the risks and benefits of this medicine. This medicine can transfer from your body to others. If a person or pet comes in contact with the area where this medicine was applied to your skin, they may  have a serious risk of side effects. If you cannot avoid skin-to-skin contact with another person, make sure the site where this medicine was applied is covered with clothing. If accidental contact happens, the skin of the person or pet should be washed right away with soap and water. Also, a male partner who is pregnant or trying to get pregnant should avoid contact with the gel or treated skin. This medicine may affect blood sugar levels. If you have diabetes, check with your doctor or health care professional before you change your diet or the dose of your diabetic medicine. This drug is banned from use in athletes by most athletic organizations. What side effects may I notice from receiving this medicine? Side effects that you should report to your doctor or health care professional  as soon as possible: -allergic reactions like skin rash, itching or hives, swelling of the face, lips, or tongue -breast enlargement -breathing problems -changes in emotions or moods, especially anger, depression, or rage -dark urine -general ill feeling or flu-like symptoms -light-colored stools -loss of appetite, nausea -nausea, vomiting -pain, swelling, warmth in the leg -right upper belly pain -stomach pain -swelling of the ankles, feet, hands -too frequent or persistent erections -trouble passing urine or change in the amount of urine -unusually weak or tired -yellowing of the eyes or skin Side effects that usually do not require medical attention (report to your doctor or health care professional if they continue or are bothersome): -acne -change in sex drive or performance -diarrhea -hair loss -headache This list may not describe all possible side effects. Call your doctor for medical advice about side effects. You may report side effects to FDA at 1-800-FDA-1088. Where should I keep my medicine? Keep out of the reach of children. This medicine can be abused. Keep your medicine in a safe place to protect it from theft. Do not share this medicine with anyone. Selling or giving away this medicine is dangerous and against the law. Store upright at room temperature between 15 to 30 degrees C (59 to 86 degrees F). Keep closed until use. Protect from heat and light. This medicine is flammable. Avoid exposure to heat, fire, flame, and smoking. Throw away any unused medicine after the expiration date. NOTE: This sheet is a summary. It may not cover all possible information. If you have questions about this medicine, talk to your doctor, pharmacist, or health care provider.  2018 Elsevier/Gold Standard (2015-03-31 16:38:08)

## 2018-01-01 NOTE — Progress Notes (Signed)
Subjective:    Patient ID: Harold [name], male    DOB: [birth year], 62 y.o.   MRN: [id number]  62y/o Caucasian established male pt c/o red, splotchy rash to central chest with scaly, dryness to edges. Noticed it this morning after getting out of the shower. Is not painful or itchy. Recently started a study for topical testosterone and has been applying to bilateral arms wears white cotton t shirt one hour then showers; does not wear t shirt or top to bed; not itching/hot/painful/or purulent discharge Blood sugars have been running in 140s and he has been feeling better this month     Review of Systems  Constitutional: Negative for activity change, appetite change, chills, diaphoresis, fatigue and fever.  HENT: Negative for trouble swallowing and voice change.   Eyes: Negative for photophobia and visual disturbance.  Respiratory: Negative for cough, shortness of breath, wheezing and stridor.   Cardiovascular: Negative for leg swelling.  Gastrointestinal: Negative for diarrhea, nausea and vomiting.  Endocrine: Negative for cold intolerance and heat intolerance.  Genitourinary: Negative for difficulty urinating.  Musculoskeletal: Negative for arthralgias, back pain, gait problem, joint swelling, myalgias, neck pain and neck stiffness.  Skin: Positive for color change and rash. Negative for pallor and wound.  Allergic/Immunologic: Positive for immunocompromised state.  Neurological: Negative for tremors and weakness.  Hematological: Negative for adenopathy. Does not bruise/bleed easily.  Psychiatric/Behavioral: Negative for agitation, confusion and sleep disturbance.       Objective:   Physical Exam  Constitutional: He is oriented to person, place, and time. Vital signs are normal. He appears well-developed and well-nourished. He is active and cooperative.  Non-toxic appearance. He does not have a sickly appearance. He does not appear ill. No distress.  HENT:  Head: Normocephalic  and atraumatic.  Right Ear: Hearing and external ear normal.  Left Ear: Hearing and external ear normal.  Nose: Nose normal.  Mouth/Throat: Uvula is midline, oropharynx is clear and moist and mucous membranes are normal. No oropharyngeal exudate.  Eyes: Pupils are equal, round, and reactive to light. Conjunctivae, EOM and lids are normal. Right eye exhibits no discharge. Left eye exhibits no discharge. No scleral icterus.  Neck: Trachea normal, normal range of motion and phonation normal. Neck supple. No tracheal tenderness and no muscular tenderness present. No neck rigidity. No tracheal deviation, no edema, no erythema and normal range of motion present.  Cardiovascular: Normal rate, regular rhythm, normal heart sounds and intact distal pulses.  Pulmonary/Chest: Effort normal and breath sounds normal. No stridor. No respiratory distress. He has no decreased breath sounds. He has no wheezes. He has no rhonchi. He has no rales. He exhibits no tenderness, no crepitus, no edema and no swelling. Right breast exhibits no inverted nipple, no mass, no nipple discharge, no skin change and no tenderness. Left breast exhibits no inverted nipple, no mass, no nipple discharge, no skin change and no tenderness. Breasts are symmetrical.    Abdominal: Soft. Normal appearance. He exhibits no distension. There is no rigidity and no guarding.  Musculoskeletal: Normal range of motion. He exhibits no edema, tenderness or deformity.       Right shoulder: Normal.       Left shoulder: Normal.       Right elbow: Normal.      Left elbow: Normal.       Right hip: Normal.       Left hip: Normal.       Right knee: Normal.  Left knee: Normal.       Right ankle: Normal.       Left ankle: Normal.       Cervical back: Normal.       Thoracic back: Normal.       Lumbar back: Normal.       Right hand: Normal.       Left hand: Normal.  Lymphadenopathy:       Head (right side): No submental, no submandibular, no  tonsillar, no preauricular, no posterior auricular and no occipital adenopathy present.       Head (left side): No submental, no submandibular, no tonsillar, no preauricular, no posterior auricular and no occipital adenopathy present.    He has no cervical adenopathy.       Right cervical: No superficial cervical, no deep cervical and no posterior cervical adenopathy present.      Left cervical: No superficial cervical, no deep cervical and no posterior cervical adenopathy present.  Neurological: He is alert and oriented to person, place, and time. He has normal strength. He is not disoriented. He displays no atrophy and no tremor. No cranial nerve deficit or sensory deficit. He exhibits normal muscle tone. He displays no seizure activity. Coordination and gait normal. GCS eye subscore is 4. GCS verbal subscore is 5. GCS motor subscore is 6.  In/out of chair and on/off exam table without difficulty; gait sure and steady in hallway  Skin: Skin is warm, dry and intact. Capillary refill takes less than 2 seconds. Rash noted. No abrasion, no bruising, no burn, no ecchymosis, no laceration, no lesion, no petechiae and no purpura noted. Rash is macular. Rash is not papular, not maculopapular, not nodular, not pustular, not vesicular and not urticarial. He is not diaphoretic. No erythema. No pallor.     Psychiatric: He has a normal mood and affect. His speech is normal and behavior is normal. Judgment and thought content normal. He is not actively hallucinating. Cognition and memory are normal. He is attentive.  Nursing note and vitals reviewed.         Assessment & Plan:  A-rash  P-reviewed lexicomp frequent side effects with topical testosterone use and noted up to 6% patients with skin symptoms from acne to contact dermatitis discussed with patient.  Patient to notify his POC at testosterone study clinic.  Consider washing sheets more frequently along with towels and/or washclothes.  Monitor for  signs of infection e.g. Swelling/hot to touch/spreading redness/purulent discharge/painful and follow up for re-evaluation if these occur.  Denied itching/pain/discharge no medications prescribed at this time as not infected or bothering patient other than he can see lesions when clothes off  Exitcare handout on rash.  Patient verbalized understanding information/instructions, agreed with plan of care and had no further questions at this time.

## 2018-01-02 ENCOUNTER — Ambulatory Visit: Payer: Self-pay | Admitting: *Deleted

## 2018-01-02 VITALS — BP 146/72 | Wt 322.0 lb

## 2018-01-02 DIAGNOSIS — I10 Essential (primary) hypertension: Secondary | ICD-10-CM

## 2018-01-02 DIAGNOSIS — R21 Rash and other nonspecific skin eruption: Secondary | ICD-10-CM

## 2018-01-02 DIAGNOSIS — E118 Type 2 diabetes mellitus with unspecified complications: Secondary | ICD-10-CM

## 2018-01-02 DIAGNOSIS — Z794 Long term (current) use of insulin: Principal | ICD-10-CM

## 2018-01-02 NOTE — Progress Notes (Signed)
Reassessed chest rash today. Similar appearance to yesterday-grouped nummular macular rash with hyperpigmentation. Grouping remains the same. Has not spread or decreased in size. Hyperpigmentation has decreased some. Pt reports it is still not bothersome. He did notify his POC at the testosterone study and they advised to keep an eye on it and let them know if it changes.  He has no further questions/concerns at this time.

## 2018-01-05 ENCOUNTER — Ambulatory Visit: Payer: Self-pay | Admitting: *Deleted

## 2018-01-05 VITALS — BP 142/72 | Wt 319.0 lb

## 2018-01-05 DIAGNOSIS — I10 Essential (primary) hypertension: Secondary | ICD-10-CM

## 2018-01-05 DIAGNOSIS — E118 Type 2 diabetes mellitus with unspecified complications: Secondary | ICD-10-CM

## 2018-01-05 DIAGNOSIS — Z794 Long term (current) use of insulin: Principal | ICD-10-CM

## 2018-01-05 NOTE — Progress Notes (Signed)
BGs still variable, averaging 150's-280's this weekend with a few lower outliers. None over 300. Asks to check mydrugcosts website for novolog coverage to see if it has changed according to the denial letters he has received and now both novolog and novolog flexpen are showing as no coverage. Pt reports he has no choice but to go to what insurance says they will pay for then. Advised him to continue speaking with endocrinologist and completing appeals. Still has 2-3 month supply Novolog at home.

## 2018-01-23 ENCOUNTER — Ambulatory Visit: Payer: Self-pay | Admitting: *Deleted

## 2018-01-23 VITALS — BP 138/72 | Wt 314.0 lb

## 2018-01-23 DIAGNOSIS — I10 Essential (primary) hypertension: Secondary | ICD-10-CM

## 2018-01-23 DIAGNOSIS — E118 Type 2 diabetes mellitus with unspecified complications: Secondary | ICD-10-CM

## 2018-01-23 DIAGNOSIS — Z794 Long term (current) use of insulin: Principal | ICD-10-CM

## 2018-01-23 NOTE — Progress Notes (Signed)
Libre patch monthly eval shows 65% of time BGs are in target range, 33% above target and 2% below. Much improved from previous per pt. He reports new endocrinologist was pleased with this. No med changes at this time. MD is okay with insulin change insurance is requiring. Pt does not need to switch until after the new year due to supply at home. Additional wt loss noted. Pt congratulated. He reports feeling better while walking and out shopping this weekend. Fewer breaks, less pain in legs and back.

## 2018-01-30 ENCOUNTER — Ambulatory Visit: Payer: Self-pay | Admitting: *Deleted

## 2018-01-30 ENCOUNTER — Other Ambulatory Visit: Payer: Self-pay | Admitting: Cardiology

## 2018-01-30 VITALS — BP 164/74

## 2018-01-30 DIAGNOSIS — E118 Type 2 diabetes mellitus with unspecified complications: Secondary | ICD-10-CM

## 2018-01-30 DIAGNOSIS — I10 Essential (primary) hypertension: Secondary | ICD-10-CM

## 2018-01-30 DIAGNOSIS — Z794 Long term (current) use of insulin: Principal | ICD-10-CM

## 2018-01-30 NOTE — Telephone Encounter (Signed)
Rx request sent to pharmacy.  

## 2018-02-02 ENCOUNTER — Ambulatory Visit: Payer: Self-pay | Admitting: *Deleted

## 2018-02-02 VITALS — BP 162/83 | HR 62 | Wt 314.0 lb

## 2018-02-02 DIAGNOSIS — I10 Essential (primary) hypertension: Secondary | ICD-10-CM

## 2018-02-02 DIAGNOSIS — E118 Type 2 diabetes mellitus with unspecified complications: Secondary | ICD-10-CM

## 2018-02-02 DIAGNOSIS — Z794 Long term (current) use of insulin: Principal | ICD-10-CM

## 2018-02-02 NOTE — Progress Notes (Signed)
CBGs variable over the weekend. 298 this am with no injections, no food. Was 80-180 last night.  BPs still trending upwards. Today is highest reading in the past 4-5 months. Pt plans to call cardiology and set up appt due to this and recent notice of fluid retention on RLE. Denies increased ShOB, chest pain, any other sx.

## 2018-02-03 ENCOUNTER — Other Ambulatory Visit: Payer: Self-pay | Admitting: Cardiology

## 2018-02-03 MED ORDER — NITROGLYCERIN 0.4 MG SL SUBL: SUBLINGUAL_TABLET | SUBLINGUAL | 3 refills | Status: DC

## 2018-02-04 ENCOUNTER — Telehealth: Payer: Self-pay | Admitting: Cardiology

## 2018-02-04 MED ORDER — NITROGLYCERIN 0.4 MG SL SUBL: SUBLINGUAL_TABLET | SUBLINGUAL | 3 refills | Status: DC

## 2018-02-04 NOTE — Telephone Encounter (Signed)
Rx request sent to pharmacy.

## 2018-02-09 ENCOUNTER — Ambulatory Visit: Payer: Self-pay | Admitting: *Deleted

## 2018-02-09 VITALS — BP 120/72 | Wt 314.0 lb

## 2018-02-09 DIAGNOSIS — E118 Type 2 diabetes mellitus with unspecified complications: Secondary | ICD-10-CM

## 2018-02-09 DIAGNOSIS — Z794 Long term (current) use of insulin: Principal | ICD-10-CM

## 2018-02-09 DIAGNOSIS — I10 Essential (primary) hypertension: Secondary | ICD-10-CM

## 2018-02-09 NOTE — Progress Notes (Signed)
BP check sitting and standing per nephrologist request. Had an approx 82PP systolic drop in office between positions. Today 50pt drop with standing. Pt asymptomatic today. Sts at times he will feel slightly lightheaded upon standing which quickly resolves. BG still variable. 150-300 on average with some spikes and drops. Seeing endocrinology for f/u and A1c 03/09/18.   Submitting MD letter of need for Novolog to insurance to try to continue coverage of this as MD feels he should not be on another insulin currently.

## 2018-02-10 ENCOUNTER — Ambulatory Visit: Payer: Self-pay | Admitting: Adult Health

## 2018-02-13 ENCOUNTER — Ambulatory Visit: Payer: Self-pay | Admitting: *Deleted

## 2018-02-13 VITALS — BP 142/68 | HR 65 | Wt 314.0 lb

## 2018-02-13 DIAGNOSIS — E118 Type 2 diabetes mellitus with unspecified complications: Secondary | ICD-10-CM

## 2018-02-13 DIAGNOSIS — I10 Essential (primary) hypertension: Secondary | ICD-10-CM

## 2018-02-13 DIAGNOSIS — Z794 Long term (current) use of insulin: Principal | ICD-10-CM

## 2018-02-13 NOTE — Progress Notes (Signed)
High 100's-low 200's. Spike to 420s 12/25 as he went to family's house and did not take insulin with him. Took meds when he returned home and levels dropped to mid 100s.  BP more stable today between sitting and standing positions. Asymptomatic upon standing.

## 2018-02-20 ENCOUNTER — Ambulatory Visit: Payer: Self-pay | Admitting: *Deleted

## 2018-02-20 VITALS — BP 133/73 | HR 59 | Ht 73.0 in | Wt 310.0 lb

## 2018-02-20 DIAGNOSIS — Z79899 Other long term (current) drug therapy: Secondary | ICD-10-CM

## 2018-02-20 DIAGNOSIS — E118 Type 2 diabetes mellitus with unspecified complications: Secondary | ICD-10-CM

## 2018-02-20 DIAGNOSIS — Z0000 Encounter for general adult medical examination without abnormal findings: Secondary | ICD-10-CM

## 2018-02-20 DIAGNOSIS — Z794 Long term (current) use of insulin: Secondary | ICD-10-CM

## 2018-02-20 DIAGNOSIS — I10 Essential (primary) hypertension: Secondary | ICD-10-CM

## 2018-02-20 NOTE — Progress Notes (Signed)
[employer] insurance premium discount evaluation: Labs Drawn. Replacements ROI form signed. Tobacco Free Attestation form signed.  Forms placed in paper chart. Will route results to pt's multiple providers per pt's request during result review.   Pt also reports he received letter from [employer] providing approval for his continued coverage of Novolog.

## 2018-02-21 LAB — CMP12+LP+TP+TSH+6AC+PSA+CBC…
A/G RATIO: 1.6 (ref 1.2–2.2)
ALT: 19 IU/L (ref 0–44)
AST: 22 IU/L (ref 0–40)
Albumin: 4.2 g/dL (ref 3.6–4.8)
Alkaline Phosphatase: 77 IU/L (ref 39–117)
BASOS ABS: 0.1 10*3/uL (ref 0.0–0.2)
BILIRUBIN TOTAL: 0.5 mg/dL (ref 0.0–1.2)
BUN/Creatinine Ratio: 15 (ref 10–24)
BUN: 24 mg/dL (ref 8–27)
Basos: 1 %
CHLORIDE: 93 mmol/L — AB (ref 96–106)
Calcium: 10 mg/dL (ref 8.6–10.2)
Chol/HDL Ratio: 4.3 ratio (ref 0.0–5.0)
Cholesterol, Total: 192 mg/dL (ref 100–199)
Creatinine, Ser: 1.65 mg/dL — ABNORMAL HIGH (ref 0.76–1.27)
EOS (ABSOLUTE): 0.2 10*3/uL (ref 0.0–0.4)
Eos: 3 %
Estimated CHD Risk: 0.8 times avg. (ref 0.0–1.0)
FREE THYROXINE INDEX: 1.6 (ref 1.2–4.9)
GFR calc non Af Amer: 43 mL/min/{1.73_m2} — ABNORMAL LOW (ref >59)
GFR, EST AFRICAN AMERICAN: 50 mL/min/{1.73_m2} — AB (ref >59)
GGT: 28 IU/L (ref 0–65)
GLOBULIN, TOTAL: 2.6 g/dL (ref 1.5–4.5)
Glucose: 176 mg/dL — ABNORMAL HIGH (ref 65–99)
HDL: 45 mg/dL (ref >39)
Hematocrit: 46.6 % (ref 37.5–51.0)
Hemoglobin: 16.4 g/dL (ref 13.0–17.7)
IMMATURE GRANS (ABS): 0 10*3/uL (ref 0.0–0.1)
Immature Granulocytes: 0 %
Iron: 91 ug/dL (ref 38–169)
LDH: 159 IU/L (ref 121–224)
LDL Calculated: 76 mg/dL (ref 0–99)
LYMPHS ABS: 2 10*3/uL (ref 0.7–3.1)
LYMPHS: 30 %
MCH: 29.9 pg (ref 26.6–33.0)
MCHC: 35.2 g/dL (ref 31.5–35.7)
MCV: 85 fL (ref 79–97)
Monocytes Absolute: 0.5 10*3/uL (ref 0.1–0.9)
Monocytes: 8 %
Neutrophils Absolute: 3.7 10*3/uL (ref 1.4–7.0)
Neutrophils: 58 %
PHOSPHORUS: 3.1 mg/dL (ref 2.5–4.5)
PLATELETS: 177 10*3/uL (ref 150–450)
PROSTATE SPECIFIC AG, SERUM: 0.5 ng/mL (ref 0.0–4.0)
Potassium: 4.7 mmol/L (ref 3.5–5.2)
RBC: 5.49 x10E6/uL (ref 4.14–5.80)
RDW: 14.4 % (ref 12.3–15.4)
SODIUM: 138 mmol/L (ref 134–144)
T3 Uptake Ratio: 25 % (ref 24–39)
T4, Total: 6.2 ug/dL (ref 4.5–12.0)
TOTAL PROTEIN: 6.8 g/dL (ref 6.0–8.5)
TSH: 1.91 u[IU]/mL (ref 0.450–4.500)
Triglycerides: 355 mg/dL — ABNORMAL HIGH (ref 0–149)
Uric Acid: 6.7 mg/dL (ref 3.7–8.6)
VLDL CHOLESTEROL CAL: 71 mg/dL — AB (ref 5–40)
WBC: 6.5 10*3/uL (ref 3.4–10.8)

## 2018-02-21 LAB — MAGNESIUM: MAGNESIUM: 2 mg/dL (ref 1.6–2.3)

## 2018-02-21 LAB — HGB A1C W/O EAG: Hgb A1c MFr Bld: 7.1 % — ABNORMAL HIGH (ref 4.8–5.6)

## 2018-02-24 NOTE — Progress Notes (Signed)
noted 

## 2018-02-26 ENCOUNTER — Ambulatory Visit: Payer: Self-pay | Admitting: *Deleted

## 2018-02-26 VITALS — BP 120/70 | Wt 312.0 lb

## 2018-02-26 DIAGNOSIS — R079 Chest pain, unspecified: Secondary | ICD-10-CM

## 2018-02-26 NOTE — Progress Notes (Addendum)
Pt c/o R sided chest pain, intermittent. Dull ache, sometimes sharp. Denies chest pressure. Onset a few days ago. Sts "the pain isn't terrible or unbearable, but it's enough for me to notice it. A 2 of 10 pain." No recent illnesses. No increase in baseline BLE of trace-1+. No ShOB, diaphoresis, radiating pain. 2# wt gain in one week.   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 Has nitroglycerin SL with him but has never taken any. Reinforced the indications for nitroglycerin use and encouraged pt to use it if he felt like his symptoms were anginal in nature or if it was indicated. Pt took 1 SL nitro at 1201. BP repeated 1205, had dropped significantly. Pt feeling lightheaded and "heavy" after nitro. Chest pain is still intermittent but is now 1/10. No ekg available in clinic.   Sx from nitro have passed, BP improved and pt is going to return to workstation. Pt advised if pain returns, he can take another nitro if he feels it is indicated. If nitro does not relieve pain, or pain worsens, he should proceed to ER for ekg and work up. He verbalizes understanding and agreement. Sts he will contact his cardiology office for further recommendations since he has never had to take nitro and he has not had similar sx previously since his stent placement 2014.

## 2018-02-27 ENCOUNTER — Ambulatory Visit: Payer: PRIVATE HEALTH INSURANCE | Admitting: Cardiology

## 2018-02-27 ENCOUNTER — Encounter: Payer: Self-pay | Admitting: Cardiology

## 2018-02-27 VITALS — BP 162/72 | HR 59 | Ht 73.0 in | Wt 313.0 lb

## 2018-02-27 DIAGNOSIS — R0789 Other chest pain: Secondary | ICD-10-CM | POA: Diagnosis not present

## 2018-02-27 DIAGNOSIS — E1169 Type 2 diabetes mellitus with other specified complication: Secondary | ICD-10-CM | POA: Diagnosis not present

## 2018-02-27 DIAGNOSIS — I2510 Atherosclerotic heart disease of native coronary artery without angina pectoris: Secondary | ICD-10-CM | POA: Diagnosis not present

## 2018-02-27 DIAGNOSIS — E782 Mixed hyperlipidemia: Secondary | ICD-10-CM | POA: Diagnosis not present

## 2018-02-27 DIAGNOSIS — I10 Essential (primary) hypertension: Secondary | ICD-10-CM

## 2018-02-27 DIAGNOSIS — I5032 Chronic diastolic (congestive) heart failure: Secondary | ICD-10-CM

## 2018-02-27 DIAGNOSIS — Z9861 Coronary angioplasty status: Secondary | ICD-10-CM

## 2018-02-27 DIAGNOSIS — J984 Other disorders of lung: Secondary | ICD-10-CM

## 2018-02-27 MED ORDER — ICOSAPENT ETHYL 1 G PO CAPS: 2.0000 | ORAL_CAPSULE | Freq: Two times a day (BID) | ORAL | 3 refills | Status: DC

## 2018-02-27 MED ORDER — LOSARTAN POTASSIUM 25 MG PO TABS: 25.0000 mg | ORAL_TABLET | Freq: Every day | ORAL | 3 refills | Status: DC

## 2018-02-27 NOTE — Patient Instructions (Signed)
Medication Instructions:   START TAKING LOSARTAN 25 MG ONE TABLET DAILY  IF YOUR  LAB WORK IN 3 MONTHS-  HAVE ELEVATED TRIGLYCERIDE ( GREATER THAN 200 )-- START TAKING VASCEPA 1 GM -(TAKE 2 CAPSULES) TWICE A DAY - HAVE PRESCRIPTION FILLED  If you need a refill on your cardiac medications before your next appointment, please call your pharmacy.   Lab work: NEED LABS IN 3 MONTHS  LIPID LIVER PANEL   WILL MAIL LABSLIP   If you have labs (blood work) drawn today and your tests are completely normal, you will receive your results only by: Marland Kitchen MyChart Message (if you have MyChart) OR . A paper copy in the mail If you have any lab test that is abnormal or we need to change your treatment, we will call you to review the results.  Testing/Procedures: NOT NEEDED  Follow-Up: At [facility], you and your health needs are our priority.  As part of our continuing mission to provide you with exceptional heart care, we have created designated Provider Care Teams.  These Care Teams include your primary Cardiologist (physician) and Advanced Practice Providers (APPs -  Physician Assistants and Nurse Practitioners) who all work together to provide you with the care you need, when you need it. You will need a follow up appointment in 12 months JAN 2021 UNLESS CHEST DISCOMFORT WILL FOLLOW UP SOONER.  Please call our office 2 months in advance to schedule this appointment.  You may see Glenetta Hew, MD  or one of the following Advanced Practice Providers on your designated Care Team:   Rosaria Ferries, PA-C . Jory Sims, DNP, ANP  Any Other Special Instructions Will Be Listed Below (If Applicable).   CONTINUE TO MONITOR YOUR SYMPTOMS IF CHEST DISCOMFORT  AND CALL OFFICE WILL SCHEDULE A  MYOVIEW TEST

## 2018-02-27 NOTE — Progress Notes (Signed)
PCP: Deland Pretty, MD Nephrology, Dr. Jimmy Footman, Pulmonology: Dr. Melvyn Novas  Clinic Note: Chief Complaint  Patient presents with  . Chest Pain    Right-sided  . Coronary Artery Disease    Until yesterday, no chest pain.  No use of nitroglycerin in over 2 years.    HPI: Harold [name] is a 62 y.o. male with a PMH of multivessel CAD status post multivessel PCI (chose not to go for bypass surgery), along with morbid obesity, venous stasis (much better controlled now), hyperlipidemia, diabetes mellitus, type II who presents for essentially annual follow-up.  He is now coming in a month early because of an episode of right-sided chest pain that occurred yesterday at work.   Harold [name] was last seen in February 2019-was still doing quite very well from a CV standpoint.  Edema was well controlled. Was in very good spirits. Was due to see a spine surgeon for his persistent low back pain.  Not having any anginal symptoms.  Blood pressure well controlled. Was also following up with neurology for his CPAP.  Recent Hospitalizations: n/a  Studies Personally Reviewed - (if available, images/films reviewed: From Epic Chart or Care Everywhere)  No new studies  Interval History: Harold [name] presents today noting that for the most part has been doing very well with no major complaints, however yesterday he had an episode where he had some squeezing type discomfort along the right lower portion of his chest.  This occurred at rest while at work, it did not get worse with exertion.  He went to see the nurse at work, and then she recommended that he take nitroglycerin which did after little bit of time help symptoms.  If the nitroglycerin did help, it was a delayed effect that about 10 minutes later his symptoms improved.  Because this the first time he had to take nitroglycerin in several years, he was very concerned, and followed his work nurses recommendation to schedule a cardiology visit  today.  He has not had any further symptoms, and does note that he has been under quite a bit of stress.  He says work is been very busy and he has had some deadlines that he has been working to meet.  He has not had any of these symptoms with exertion, nor did exertion make it worse yesterday.  Otherwise he has been doing great.  His blood pressure is little high today because he is very excited, but usually his blood pressures in the 130s over 70s.  He continues to be active albeit not doing routine exercise, but is not noted a pretty significant weight loss from his previous 355 down to 313 now.    He feels better, energy level is better. His edema is notably improved and he can see his varicose veins, but no significant swelling and no ulcerations etc.  He is now on 80 mg 3 times daily of Lasix.  Indicating that this began several months ago when he had put on quite a bit of weight there was fluid related.  He is simply maintain that dose. Marland Kitchen  He tells me that his sugar levels been quite stable.  He has not had any rapid irregular heartbeats palpitations.  No syncope or near syncope.  No TIA or amaurosis fugax. He seems to be doing very well on the low-dose aspirin plus Xarelto that was started last visit.  The bruising has notably improved.  No myalgias on current dose of rosuvastatin.  ROS: A comprehensive  was performed. Review of Systems  Constitutional: Positive for weight loss. Negative for malaise/fatigue.  HENT: Negative for congestion and nosebleeds.        Getting over seasonal allergies  Respiratory: Positive for shortness of breath (baseline). Negative for cough.   Cardiovascular: Negative for leg swelling (well controlled).  Gastrointestinal: Negative for constipation and heartburn.  Genitourinary: Negative for dysuria.       Frequent urination  Musculoskeletal: Positive for back pain (with radiculopathy) and joint pain (Knees.).       Overall, his back and knees are doing  much better with the weight loss.  Neurological: Positive for tingling and tremors. Negative for dizziness and weakness.       Stable neuropathy symptoms.  Endo/Heme/Allergies: Positive for environmental allergies.  Psychiatric/Behavioral: Negative for depression and memory loss. The patient is not nervous/anxious.   All other systems reviewed and are negative.   I have reviewed and (if needed) personally updated the patient's problem list, medications, allergies, past medical and surgical history, social and family history.   Past Medical History:  Diagnosis Date  . Anginal pain [facility]) March 2015   Cardiac cath showed patent stents with distal LAD, circumflex-OM and RCA disease in small vessels.  . Anxiety   . CAD S/P percutaneous coronary angioplasty 2003, 04/2012   status post PCI to LAD, circumflex-OM 2, RCA  . Carotid artery occlusion   . Chronic renal insufficiency, stage II (mild)   . Chronic venous insufficiency    varicosities, no reflux; dopplers 04/14/12- valvular insufficiency in the R and L GSV  . Complication of anesthesia   . COPD, mild ([place])   . Depression    situaltional   . Diabetes mellitus   . Dyslipidemia associated with type 2 diabetes mellitus ([place])   . GERD (gastroesophageal reflux disease)   . HTN (hypertension)   . Hypothyroidism   . Neuromuscular disorder (HCC)    neuropathy in feet  . Neuropathy    notably improved following PCI with improved cardiac function  . Obesity   . 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 PONV (postoperative nausea and vomiting)    "[place]"  . Pulmonary hypertension ([place]) 04/2012   PA pressure 60mhg  . Sleep apnea    uses nightly    Past Surgical History:  Procedure Laterality Date  . ABDOMINAL AORTAGRAM N/A 11/15/2011   Procedure: ABDOMINAL AMaxcine Ham  Surgeon: CElam Dutch MD;  [facility];  Service:  Cardiovascular;  [name];  . ABIs  04/27/2012   mild bilateral arterial insufficiency  . BACK SURGERY  2005 x1   X2-2010  . BUNIONECTOMY Right 11/11/2013   Procedure: RIGHT FOOT SILVER BUNIONECTOMY;  Surgeon: JWylene Simmer MD;  [facility]  Service: Orthopedics;  Laterality: Right;  . CARDIAC CATHETERIZATION  12/11/2001   significant 3V CAD, normal LV function  . Carotid Doppler  04/27/2012   right internal carotid: Elevated velocities but no evidence of plaque. Left internal carotid 40-59%  . CAROTID ENDARTERECTOMY  2005   Right; recent carotid Dopplers notes elevated velocities.   . colonscopy    . CORONARY ANGIOPLASTY  12/21/2001   PTCA of the distal and mid AV groove circ, unsuccessful PTCA of second OM total occlusion, unsuccessful PTCA of the apical LAD total occlusion  . CORONARY ANGIOPLASTY WITH STENT PLACEMENT  04/29/12   PCI to 3 RCA lesions,  Promus Premiere 2.72mx8mm distally, mid was 2.584m8mm and proximally 2.75x1625mEF 55-60%  . CORONARY STENT PLACEMENT  04/28/12   PCI to LAD (3x23m70mence DES postdilated to 3.25) and circ prox and mid (2 overlappinmg 2.5mmx108mmXience DES postdilated to 2.75mm)61mDOPPLER ECHOCARDIOGRAPHY  04/28/2012   poor quality study: EF estimated 60-65%; unable to assess diastolic function (previously noted to have diastolic dysfunction); severely dilated left atrium and mild right atrium; dilated IVC consistent with increased central venous pressure..  . HERNIA REPAIR    . LEFT AND RIGHT HEART CATHETERIZATION WITH CORONARY ANGIOGRAM N/A 04/27/2012   Procedure: LEFT AND RIGHT HEART CATHETERIZATION WITH CORONARY ANGIOGRAM;  Surgeon: Caleah Tortorelli Leonie Man [facility];  Service: Cardiovascular;  [name];  . LEFT AND RIGHT HEART CATHETERIZATION WITH CORONARY ANGIOGRAM N/A 05/14/2013   Procedure: LEFT AND RIGHT HEART CATHETERIZATION WITH CORONARY ANGIOGRAM;  Surgeon: ThomasTroy Sine [facility]  Moderate Pulm HTN: 46/16 - mean 33 mmHg; PCWP 22mmHg34[name], [name], Patent RCA ostial mid and distal stents - 70% distal runoff Dz  . LUMBAR LAMINECTOMY/DECOMPRESSION MICRODISCECTOMY  01/07/2012   Procedure: LUMBAR LAMINECTOMY/DECOMPRESSION MICRODISCECTOMY 1 LEVEL;  Surgeon: Kyle L Winfield CunasLocation: [facility];  Service: Neurosurgery;  Laterality: Bilateral;   Lumbar Three-Four Decompression  . PERCUTANEOUS CORONARY STENT INTERVENTION (PCI-S) N/A 04/28/2012   Procedure: PERCUTANEOUS CORONARY STENT INTERVENTION (PCI-S);  Surgeon: Thomas Troy Sine[facility];  Service: Cardiovascular;  [name];  . PERCUTANEOUS CORONARY STENT INTERVENTION (PCI-S) N/A 04/29/2012   Procedure: PERCUTANEOUS CORONARY STENT INTERVENTION (PCI-S);  Surgeon: Haruna Rohlfs WLeonie ManLocation[facility];  Service: Cardiovascular;  [name];  . SPINE SURGERY    . UMBILICAL HERNIA REPAIR  2009   steel mesh insert    Current Meds  Medication Sig  . allopurinol (ZYLOPRIM) 100 MG tablet Take 100 mg by mouth daily.  . aspirMarland Kitchenn EC 81 MG tablet Take 1 tablet (81 mg total) by mouth daily.  . canagliflozin (INVOKANA) 100 MG TABS tablet Invokana 100 mg tablet  TAKE 1 TABLET BY MOUTH EVERY DAY  . carvedilol (COREG) 6.25 MG tablet TAKE 1 TABLET (6.25 MG TOTAL) BY MOUTH 2 (TWO) TIMES DAILY.  . ContiMarland Kitchenuous Blood Gluc Receiver (FREESTYLE LIBRE 14 DAY READER) DEVI FreeStyle Libre 14 Day Reader  USE AS DIRECTED  . Continuous Blood Gluc Sensor (FREESTYLE LIBRE 14 DAY SENSOR) MISC APPLY 1 SENSOR EVERY 14 DAYS  . escitalopram (LEXAPRO) 20 MG tablet TAKE 1 TABLET (20 MG TOTAL) BY MOUTH DAILY.  . furosemide (LASIX) 80 MG tablet Take 160 mg by mouth 3 (three) times daily.  . Insulin Glargine (LANTUS SOLOSTAR) 100 UNIT/ML Solostar Pen Lantus Solostar U-100 Insulin 100 unit/mL (3 mL) subcutaneous pen  INJECT 60  UNITS SUBCUTANEOUSLY TWICE DAILY  . levothyroxine (SYNTHROID, LEVOTHROID) 88 MCG tablet Take 88 mcg by mouth daily before breakfast.  . nitroGLYCERIN (NITROSTAT) 0.4 MG SL tablet PLACE 1 TAB UNDER THE TONGUE EVERY 5 MINUTES AS NEEDED FOR CHEST PAIN (SEVERE PRESSURE OR TIGHTNESS)  . NOVOLOG FLEXPEN 100 UNIT/ML FlexPen INJECT 40 UNITS WITH MEALS 3 TIMES DAILY SUBCUTANEOUSLY  . omeprazole (PRILOSEC) 20 MG capsule Take 20 mg by mouth daily.  . potassium chloride (MICRO-K) 10 MEQ CR capsule Take by mouth daily.  . rivaroxaban (XARELTO) 2.5 MG TABS tablet Take 1 tablet (2.5  mg total) by mouth 2 (two) times daily.  . rosuvastatin (CRESTOR) 20 MG tablet Take 20 mg by mouth daily.  . TRULICITY 1.5 HF/0.2OV SOPN Inject 1.5 mg into the skin once a week.    Allergies  Allergen Reactions  . Septra [Bactrim] Itching  . Penicillins Hives    Allergy to All cillin drugs  Ancef given 9/24 with no obvious reaction  . Clopidogrel     NON RESPONDER  P2Y12  . Metformin And Related   . Other     Walnuts, pecans     Social History   Tobacco Use  . Smoking status: Former Smoker    Packs/day: 1.00    Years: 42.00    Pack years: 42.00    Types: Cigarettes    Last attempt to quit: 03/23/2003    Years since quitting: 14.9  . Smokeless tobacco: Never Used  Substance Use Topics  . Alcohol use: No    Alcohol/week: 0.0 standard drinks  . Drug use: No   Social History   Social History Narrative   He and his partner [name] have now officially gotten married on 05/26/2013.  [name] is also a patient of our clinic.  He is now initially hyphenated his last name.   He works at [employer].   He does not smoke, he quit in 2009.  He does not drink alcohol.   He was adopted and no family history is known.    He was adopted. Family history is unknown by patient.  Wt Readings from Last 3 Encounters:  02/27/18 (!) 313 lb (142 kg)  02/26/18 (!) 312 lb (141.5 kg)  02/20/18 (!) 310 lb (140.6 kg)  329 lb  in 07/2016  PHYSICAL EXAM BP (!) 162/72   Pulse (!) 59   Ht _0  (1.854 m)   Wt (!) 313 lb (142 kg)   BMI 41.30 kg/m   Physical Exam  Constitutional: He is oriented to person, place, and time. He appears well-developed and well-nourished. No distress.  Still morbidly obese, but notably less so than last visit.  BMI is now 41 as opposed to 43 or 44.  HENT:  Head: Normocephalic and atraumatic.  Neck: Normal range of motion. Neck supple. No hepatojugular reflux and no JVD present. Carotid bruit is not present (Right CAE scar noted).  Cardiovascular: Normal rate, regular rhythm, S1 normal and S2 normal.  No extrasystoles are present. PMI is not displaced (Cannot palpate). Exam reveals distant heart sounds and decreased pulses (Mildly diminished pedal pulses.). Exam reveals no gallop.  No murmur heard. Pulmonary/Chest: Effort normal and breath sounds normal. No respiratory distress. He has no wheezes. He has no rales.  Abdominal: Soft. Bowel sounds are normal.  Profound truncal obesity  Musculoskeletal: Normal range of motion.        General: Edema (Trivial with actually visible features of the leg including varicose veins.) present.  Neurological: He is alert and oriented to person, place, and time.  Skin: Skin is warm and dry.  Notably improved venous stasis dermatitis in bilateral lower legs.  No lesion.  Psychiatric: He has a normal mood and affect. His behavior is normal. Judgment and thought content normal.     Adult ECG Report Not checked  Other studies Reviewed: Additional studies/ records that were reviewed today include:  Recent Labs:  Being monitored by PCP Lab Results  Component Value Date   CHOL 192 02/20/2018   HDL 45 02/20/2018   LDLCALC 76 02/20/2018   TRIG  355 (H) 02/20/2018   CHOLHDL 4.3 02/20/2018    ASSESSMENT / PLAN:  Chest pain seems to be mostly musculoskeletal.  Not likely anginal in nature.  I reassured him.  Would not recommend stress test at this  time. Blood pressure elevated, will add ARB: Losartan 25 mg daily. Still has elevated triglycerides, may consider Vascepa if still elevated in future visits.  Hopefully by controlling his sugars this will improve.  Problem List Items Addressed This Visit    CAD, LAD/CFX DES 04/28/12- staged RCA DES 04/29/12 after pt declined CABG, cath 05/14/13 stable CAD medical therapy (Chronic)    Most likely not anginal chest pain with his current complaint. Has been well controlled on carvedilol.  Adding ARB. Is on Crestor. Last visit we converted him to aspirin plus low-dose Xarelto and he is doing very well with this is baseline.      Relevant Medications   losartan (COZAAR) 25 MG tablet   Icosapent Ethyl (VASCEPA) 1 g CAPS   Other Relevant Orders   EKG 12-Lead   Lipid panel   Hepatic function panel   Chronic diastolic heart failure, NYHA class 2 - PCWPf 22 mmHg during catheterization. (Chronic)    He probably has some diastolic function, but most of his dyspnea will be related to obesity with obesity hypoventilation syndrome and probable mild pulmonary pretension as result. Improved with weight loss. Most of his edema was probably more related to venous stasis.  He is currently on high-dose Lasix prescribed by his nephrologist. Is only on carvedilol, will add ARB.      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  Has tried hypertriglyceridemia, may need to consider Vascepa. We will recheck labs in 3 months.  If triglycerides are still elevated, will start Vascepa at that time.      Relevant Medications   losartan (COZAAR) 25 MG tablet   Icosapent Ethyl (VASCEPA) 1 g CAPS   Other Relevant Orders   EKG 12-Lead   Lipid panel   Hepatic function panel   Essential hypertension (Chronic)    Hypertensive today.  May  be because of anxiety however we will restart losartan at 25 mg daily.      Relevant Medications   losartan (COZAAR) 25 MG tablet   Icosapent Ethyl (VASCEPA) 1 g CAPS   Other Relevant Orders   EKG 12-Lead   Obesity, Class III, BMI 40-49.9 (morbid obesity) (HCC) (Chronic)    He has done very well now with weight loss.  Is more active, watching his diet.  As such his other risk factors of also improved.  Continue to recommend working on diet and exercise.      Restrictive lung disease (Chronic)    Related to body weight.  Should have notably improved with weight loss.      Right-sided chest wall pain - Primary    Right-sided chest wall pain that was not relieved immediately with nitroglycerin.  Not associate with exertion.  Very unlikely to be cardiac in nature.  I reassured him that this is probably noncardiac and that he should not be concerned.  Probably related to stress or musculoskeletal.  Continue current medications.  No need for stress test at this time, however he has more symptoms, we will evaluate with a Myoview.  I did provide a note for his employer indicating that he does have 2 medications (furosemide and Invokana) that would make him urinate frequently.  For this reason, he may need to stop working intermittently to use the bathroom.  Current medicines are reviewed at length with the patient today. (+/- concerns) n/a The following changes have been made: n/a  Patient Instructions  Medication Instructions:   START TAKING LOSARTAN 25 MG ONE TABLET DAILY  IF YOUR  LAB WORK IN 3 MONTHS-  HAVE ELEVATED TRIGLYCERIDE ( GREATER THAN 200 )-- START TAKING VASCEPA 1 GM -(TAKE 2 CAPSULES) TWICE A DAY - HAVE PRESCRIPTION FILLED  If you need a refill on your cardiac medications before your next appointment, please call your pharmacy.   Lab work: NEED LABS IN 3 MONTHS  LIPID LIVER PANEL   WILL MAIL LABSLIP   If you have labs (blood work) drawn today and your tests  are completely normal, you will receive your results only by: Marland Kitchen MyChart Message (if you have MyChart) OR . A paper copy in the mail If you have any lab test that is abnormal or we need to change your treatment, we will call you to review the results.  Testing/Procedures: NOT NEEDED  Follow-Up: At [facility], you and your health needs are our priority.  As part of our continuing mission to provide you with exceptional heart care, we have created designated Provider Care Teams.  These Care Teams include your primary Cardiologist (physician) and Advanced Practice Providers (APPs -  Physician Assistants and Nurse Practitioners) who all work together to provide you with the care you need, when you need it. You will need a follow up appointment in 12 months JAN 2021 UNLESS CHEST DISCOMFORT WILL FOLLOW UP SOONER.  Please call our office 2 months in advance to schedule this appointment.  You may see Glenetta Hew, MD  or one of the following Advanced Practice Providers on your designated Care Team:   Rosaria Ferries, PA-C . Jory Sims, DNP, ANP  Any Other Special Instructions Will Be Listed Below (If Applicable).   CONTINUE TO MONITOR YOUR SYMPTOMS IF CHEST DISCOMFORT  AND CALL OFFICE WILL SCHEDULE A  MYOVIEW TEST    Studies Ordered:   Orders Placed This Encounter  Procedures  . Lipid panel  . Hepatic function panel  . EKG 12-Lead      Glenetta Hew, M.D., M.S. Interventional Cardiologist   Pager # [phone] Phone # [phone] [address] [place],  [address]

## 2018-03-01 ENCOUNTER — Encounter: Payer: Self-pay | Admitting: Cardiology

## 2018-03-01 NOTE — Assessment & Plan Note (Signed)
He has done very well now with weight loss.  Is more active, watching his diet.  As such his other risk factors of also improved.  Continue to recommend working on diet and exercise.

## 2018-03-01 NOTE — Assessment & Plan Note (Signed)
Right-sided chest wall pain that was not relieved immediately with nitroglycerin.  Not associate with exertion.  Very unlikely to be cardiac in nature.  I reassured him that this is probably noncardiac and that he should not be concerned.  Probably related to stress or musculoskeletal.  Continue current medications.  No need for stress test at this time, however he has more symptoms, we will evaluate with a Myoview.

## 2018-03-01 NOTE — Assessment & Plan Note (Signed)
Hypertensive today.  May be because of anxiety however we will restart losartan at 25 mg daily.

## 2018-03-01 NOTE — Assessment & Plan Note (Signed)
He is now on Crestor without fenofibrate.  Has tried hypertriglyceridemia, may need to consider Vascepa. We will recheck labs in 3 months.  If triglycerides are still elevated, will start Vascepa at that time.

## 2018-03-01 NOTE — Assessment & Plan Note (Signed)
Related to body weight.  Should have notably improved with weight loss.

## 2018-03-01 NOTE — Assessment & Plan Note (Signed)
He probably has some diastolic function, but most of his dyspnea will be related to obesity with obesity hypoventilation syndrome and probable mild pulmonary pretension as result. Improved with weight loss. Most of his edema was probably more related to venous stasis.  He is currently on high-dose Lasix prescribed by his nephrologist. Is only on carvedilol, will add ARB.

## 2018-03-01 NOTE — Assessment & Plan Note (Signed)
Most likely not anginal chest pain with his current complaint. Has been well controlled on carvedilol.  Adding ARB. Is on Crestor. Last visit we converted him to aspirin plus low-dose Xarelto and he is doing very well with this is baseline.

## 2018-03-02 ENCOUNTER — Ambulatory Visit: Payer: Self-pay | Admitting: *Deleted

## 2018-03-02 VITALS — BP 152/82 | Wt 316.0 lb

## 2018-03-02 DIAGNOSIS — I10 Essential (primary) hypertension: Secondary | ICD-10-CM

## 2018-03-02 DIAGNOSIS — E118 Type 2 diabetes mellitus with unspecified complications: Secondary | ICD-10-CM

## 2018-03-02 DIAGNOSIS — Z794 Long term (current) use of insulin: Principal | ICD-10-CM

## 2018-03-02 NOTE — Progress Notes (Addendum)
Pt reports [facility] CV visit 02/27/2018 went well with EKG unchanged from previous. No additional nitro at home once he left clinic 02/26/2018. Possible stress/anxiety related sx per Ellyn Hack. If it occurs again, will need to complete a stress test. Losartan initiated for elevated BP. Just took first dose last night.  Reports he has also scheduled an appt with the onsite counselor Dorisa. Sts his work area is becoming more and more stressful to the point it is affecting him being able to perform his job. Also sts he is considering meeting with HR again regarding this. He believes this stress and anxiety regarding his job became overwhelming this past Thursday when he was having the chest pain and agrees with Dr. Ellyn Hack after speaking with him that the sx could have been anxiety related especially since the sx did not improve until about 86mn after the nitro and approx 15-250m after he arrived to the clinic.    Additionally, Rx for Vascepa given to pt for future use if lipid panel in 3 months shows trig level >200. Appt made for lab draw for this Lipid panel and HFP per 02/27/2018 CV OV notes.

## 2018-03-06 ENCOUNTER — Ambulatory Visit: Payer: Self-pay | Admitting: *Deleted

## 2018-03-06 VITALS — BP 148/70

## 2018-03-06 DIAGNOSIS — Z794 Long term (current) use of insulin: Principal | ICD-10-CM

## 2018-03-06 DIAGNOSIS — R079 Chest pain, unspecified: Secondary | ICD-10-CM

## 2018-03-06 DIAGNOSIS — I10 Essential (primary) hypertension: Secondary | ICD-10-CM

## 2018-03-06 DIAGNOSIS — E118 Type 2 diabetes mellitus with unspecified complications: Secondary | ICD-10-CM

## 2018-03-06 NOTE — Progress Notes (Signed)
Low 200's glucoses this week.   "Twinges" of pain, both left and right chest, still occurring since seeing cardiology. Sts he hasn't noticed a pattern to them. Happens at work and at home, during periods of rest. Lasts 30sec-2 min, longer lasting than previous, but less severe. Pain 1-2/10 when they occur. No other associated sx when it occurs. Can occur at random times of the day, anywhere from 10-12x/day while awake over the past week. Pt sts he will likely call back cardiology next week and advise them of continued sx as they recommended stress test if sx persisted.   No Wt check today as clinic set up in temporary location 2/2 renovations and clinic scale not available.

## 2018-03-13 ENCOUNTER — Ambulatory Visit: Payer: Self-pay | Admitting: *Deleted

## 2018-03-13 VITALS — BP 122/62 | Wt 314.0 lb

## 2018-03-13 DIAGNOSIS — I10 Essential (primary) hypertension: Secondary | ICD-10-CM

## 2018-03-13 DIAGNOSIS — E118 Type 2 diabetes mellitus with unspecified complications: Secondary | ICD-10-CM

## 2018-03-13 DIAGNOSIS — Z794 Long term (current) use of insulin: Principal | ICD-10-CM

## 2018-03-13 NOTE — Progress Notes (Signed)
Endocrinology appt past week. They are pleased with BG control for the most part and no med changes were made.   Large BP fluctuations between L and R arm readings, 40pts. Seeing wide pulse pressures on the elevated readings averaging around 80pts. Hx of DM2 and PVD could be contributing, but BPs differences have not been this wide in at least 6 months. Pt reports continued chest "twinges" intermittently, no worse or better than previous and has already been evaluated by cardiology for these. MD noted likely stress related but if worsens, will need stress test. Losartan 82m was restarted 2 weeks ago. Will give this another 1-2 weeks for full effect of med and if still large difference between arms and elevated readings, will have pt contact cardiology for further recommendations. Pt agreeable to this.

## 2018-03-16 ENCOUNTER — Ambulatory Visit: Payer: Self-pay | Admitting: *Deleted

## 2018-03-16 VITALS — BP 152/78 | Wt 311.0 lb

## 2018-03-16 DIAGNOSIS — E118 Type 2 diabetes mellitus with unspecified complications: Secondary | ICD-10-CM

## 2018-03-16 DIAGNOSIS — Z794 Long term (current) use of insulin: Principal | ICD-10-CM

## 2018-03-16 DIAGNOSIS — I10 Essential (primary) hypertension: Secondary | ICD-10-CM

## 2018-03-16 NOTE — Progress Notes (Signed)
BG averaging 250-300 this past weekend. BP bilateral arms improved today. 16mHg difference between arms.

## 2018-03-20 ENCOUNTER — Ambulatory Visit: Payer: Self-pay | Admitting: *Deleted

## 2018-03-20 VITALS — BP 128/60 | Wt 312.0 lb

## 2018-03-20 DIAGNOSIS — Z794 Long term (current) use of insulin: Principal | ICD-10-CM

## 2018-03-20 DIAGNOSIS — E118 Type 2 diabetes mellitus with unspecified complications: Secondary | ICD-10-CM

## 2018-03-20 DIAGNOSIS — I10 Essential (primary) hypertension: Secondary | ICD-10-CM

## 2018-03-20 NOTE — Progress Notes (Signed)
BGs between 100-330 throughout the past week. Still very variable but last check with endocrinology showed the same and they were okay with readings.

## 2018-03-26 DIAGNOSIS — M79642 Pain in left hand: Secondary | ICD-10-CM | POA: Insufficient documentation

## 2018-03-27 ENCOUNTER — Ambulatory Visit: Payer: Self-pay | Admitting: *Deleted

## 2018-03-27 VITALS — BP 128/70 | HR 60 | Wt 314.0 lb

## 2018-03-27 DIAGNOSIS — I10 Essential (primary) hypertension: Secondary | ICD-10-CM

## 2018-03-27 DIAGNOSIS — Z794 Long term (current) use of insulin: Principal | ICD-10-CM

## 2018-03-27 DIAGNOSIS — E118 Type 2 diabetes mellitus with unspecified complications: Secondary | ICD-10-CM

## 2018-03-30 ENCOUNTER — Ambulatory Visit: Payer: Self-pay | Admitting: *Deleted

## 2018-03-30 VITALS — BP 138/81 | HR 62 | Wt 312.0 lb

## 2018-03-30 DIAGNOSIS — Z794 Long term (current) use of insulin: Principal | ICD-10-CM

## 2018-03-30 DIAGNOSIS — I10 Essential (primary) hypertension: Secondary | ICD-10-CM

## 2018-03-30 DIAGNOSIS — E118 Type 2 diabetes mellitus with unspecified complications: Secondary | ICD-10-CM

## 2018-03-30 NOTE — Progress Notes (Signed)
BGs running mid 200's still. Up from mid 100's he was running Nov-Dec. Not due to see Endocrinology until April.

## 2018-04-13 ENCOUNTER — Ambulatory Visit: Payer: Self-pay | Admitting: *Deleted

## 2018-04-13 VITALS — BP 145/80 | HR 59 | Wt 313.0 lb

## 2018-04-13 DIAGNOSIS — Z794 Long term (current) use of insulin: Principal | ICD-10-CM

## 2018-04-13 DIAGNOSIS — I10 Essential (primary) hypertension: Secondary | ICD-10-CM

## 2018-04-13 DIAGNOSIS — E118 Type 2 diabetes mellitus with unspecified complications: Secondary | ICD-10-CM

## 2018-04-13 NOTE — Progress Notes (Signed)
BG's still variable. Unchanged from previous. 120's-300 on average.  Dr. Jimmy Footman, nephro seen last week. Labs stable.

## 2018-04-21 ENCOUNTER — Ambulatory Visit: Payer: Self-pay | Admitting: *Deleted

## 2018-04-21 VITALS — HR 62

## 2018-04-21 DIAGNOSIS — M62830 Muscle spasm of back: Secondary | ICD-10-CM

## 2018-04-21 NOTE — Progress Notes (Signed)
R thoracic pain just under axilla x2 days. Has been a "jabbing" pain occurring only every few hours lasting a couple of seconds but now has been constant over the past hour. Reproducible with certain movements, especially raising R arm above head or moving R arm across chest. Feels like a pulling sensation when he does that. Denies ShOB, chest pressure, palpitations, fatigue, diaphoresis, pain with a deep breath. Denies known acute injury. Likely musculoskeletal based on exam. No pain/antiinflammatories today for sx. Avoid nsaids as already taking ASA, Xarelto. Pt given from clinic stock 3 packets acetaminophen extra strength 581m po 2 tabs q6-8h prn pain. Also applied Thermacare muscle ache patch to affected area for spasms/pain. Advised to wear for 8 hours/rest of work day until he arrives home and can apply Biofreeze at home before bed. Pt given ER precautions if sx change or worsen. Pt verbalizes understanding and agreement with plan of care. No further questions.

## 2018-04-23 ENCOUNTER — Other Ambulatory Visit: Payer: Self-pay | Admitting: Cardiology

## 2018-04-24 ENCOUNTER — Ambulatory Visit: Payer: Self-pay | Admitting: *Deleted

## 2018-04-24 VITALS — BP 151/82 | HR 63 | Wt 314.0 lb

## 2018-04-24 DIAGNOSIS — E118 Type 2 diabetes mellitus with unspecified complications: Secondary | ICD-10-CM

## 2018-04-24 DIAGNOSIS — I10 Essential (primary) hypertension: Secondary | ICD-10-CM

## 2018-04-24 DIAGNOSIS — Z794 Long term (current) use of insulin: Principal | ICD-10-CM

## 2018-04-24 NOTE — Progress Notes (Signed)
BGs still variable. 200 currently. 275 this morning.  Diarrhea, abd pain overnight. Improving over this morning. 3 episodes total. Reports he is working on pushing fluids to replace lost fluids.  Sts he is "slipping" on his dietary habits and needs to get back to his previous improved habits.   R sided thoracic spasm resolved per pt. He sts thermacare patch worked very well and he has had no issues since using it 3/4.

## 2018-04-27 ENCOUNTER — Ambulatory Visit: Payer: Self-pay | Admitting: *Deleted

## 2018-04-27 VITALS — BP 152/80 | Wt 316.0 lb

## 2018-04-27 DIAGNOSIS — E118 Type 2 diabetes mellitus with unspecified complications: Secondary | ICD-10-CM

## 2018-04-27 DIAGNOSIS — I10 Essential (primary) hypertension: Secondary | ICD-10-CM

## 2018-04-27 DIAGNOSIS — Z794 Long term (current) use of insulin: Principal | ICD-10-CM
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
## 2018-04-30 ENCOUNTER — Ambulatory Visit: Payer: PRIVATE HEALTH INSURANCE | Admitting: Family Medicine

## 2018-05-04 ENCOUNTER — Other Ambulatory Visit: Payer: Self-pay

## 2018-05-04 ENCOUNTER — Ambulatory Visit: Payer: Self-pay | Admitting: *Deleted

## 2018-05-04 VITALS — BP 170/86 | Wt 315.0 lb

## 2018-05-04 DIAGNOSIS — Z794 Long term (current) use of insulin: Principal | ICD-10-CM

## 2018-05-04 DIAGNOSIS — E118 Type 2 diabetes mellitus with unspecified complications: Secondary | ICD-10-CM

## 2018-05-04 DIAGNOSIS — I10 Essential (primary) hypertension: Secondary | ICD-10-CM

## 2018-05-04 NOTE — Progress Notes (Signed)
BP still increased. No new lower extremity edema. No new, increased ShOB.  BG 233, fasting currently. F/u in about 3 weeks.

## 2018-05-08 ENCOUNTER — Ambulatory Visit: Payer: Self-pay | Admitting: *Deleted

## 2018-05-08 ENCOUNTER — Other Ambulatory Visit: Payer: Self-pay

## 2018-05-08 VITALS — BP 148/78 | HR 62 | Wt 313.0 lb

## 2018-05-08 DIAGNOSIS — E118 Type 2 diabetes mellitus with unspecified complications: Secondary | ICD-10-CM

## 2018-05-08 DIAGNOSIS — I10 Essential (primary) hypertension: Secondary | ICD-10-CM

## 2018-05-08 DIAGNOSIS — Z794 Long term (current) use of insulin: Principal | ICD-10-CM

## 2018-05-11 ENCOUNTER — Ambulatory Visit: Payer: Self-pay | Admitting: *Deleted

## 2018-05-11 ENCOUNTER — Other Ambulatory Visit: Payer: Self-pay

## 2018-05-11 VITALS — BP 153/79 | HR 64 | Wt 316.0 lb

## 2018-05-11 DIAGNOSIS — E118 Type 2 diabetes mellitus with unspecified complications: Secondary | ICD-10-CM

## 2018-05-11 DIAGNOSIS — I10 Essential (primary) hypertension: Secondary | ICD-10-CM

## 2018-05-11 DIAGNOSIS — Z794 Long term (current) use of insulin: Principal | ICD-10-CM

## 2018-05-11 NOTE — Progress Notes (Signed)
BGs still "roller coaster". 300 fasting this morning, one insulin injection and now 214 currently.

## 2018-05-12 ENCOUNTER — Ambulatory Visit: Payer: Self-pay | Admitting: Registered Nurse

## 2018-05-12 ENCOUNTER — Encounter: Payer: Self-pay | Admitting: Registered Nurse

## 2018-05-12 VITALS — BP 131/71 | HR 63 | Temp 97.4°F

## 2018-05-12 DIAGNOSIS — I70213 Atherosclerosis of native arteries of extremities with intermittent claudication, bilateral legs: Secondary | ICD-10-CM | POA: Insufficient documentation

## 2018-05-12 DIAGNOSIS — R1011 Right upper quadrant pain: Secondary | ICD-10-CM

## 2018-05-12 LAB — POCT URINALYSIS DIPSTICK
BILIRUBIN UA: NEGATIVE
BILIRUBIN UA: NEGATIVE mg/dL
Glucose, UA: >=1000 mg/dL — AB
Leukocytes, UA: NEGATIVE
Nitrite, UA: NEGATIVE
PH UA: 6 (ref 5.0–8.0)
Protein Ur, POC: 30 mg/dL — AB
Specific Gravity, UA: 1.01 (ref 1.005–1.030)
UROBILINOGEN UA: 0.2 U/dL

## 2018-05-12 NOTE — Patient Instructions (Addendum)
Abdominal Bloating When you have abdominal bloating, your abdomen may feel full, tight, or painful. It may also look bigger than normal or swollen (distended). Common causes of abdominal bloating include:  Swallowing air.  Constipation.  Problems digesting food.  Eating too much.  Irritable bowel syndrome. This is a condition that affects the large intestine.  Lactose intolerance. This is an inability to digest lactose, a natural sugar in dairy products.  Celiac disease. This is a condition that affects the ability to digest gluten, a protein found in some grains.  Gastroparesis. This is a condition that slows down the movement of food in the stomach and small intestine. It is more common in people with diabetes mellitus.  Gastroesophageal reflux disease (GERD). This is a digestive condition that makes stomach acid flow back into the esophagus.  Urinary retention. This means that the body is holding onto urine, and the bladder cannot be emptied all the way. Follow these instructions at home: Eating and drinking  Avoid eating too much.  Try not to swallow air while talking or eating.  Avoid eating while lying down.  Avoid these foods and drinks: ? Foods that cause gas, such as broccoli, cabbage, cauliflower, and baked beans. ? Carbonated drinks. ? Hard candy. ? Chewing gum. Medicines  Take over-the-counter and prescription medicines only as told by your health care provider.  Take probiotic medicines. These medicines contain live bacteria or yeasts that can help digestion.  Take coated peppermint oil capsules. Activity  Try to exercise regularly. Exercise may help to relieve bloating that is caused by gas and relieve constipation. General instructions  Keep all follow-up visits as told by your health care provider. This is important. Contact a health care provider if:  You have nausea and vomiting.  You have diarrhea.  You have abdominal pain.  You have unusual  weight loss or weight gain.  You have severe pain, and medicines do not help. Get help right away if:  You have severe chest pain.  You have trouble breathing.  You have shortness of breath.  You have trouble urinating.  You have darker urine than normal.  You have blood in your stools or have dark, tarry stools. Summary  Abdominal bloating means that the abdomen is swollen.  Common causes of abdominal bloating are swallowing air, constipation, and problems digesting food.  Avoid eating too much and avoid swallowing air.  Avoid foods that cause gas, carbonated drinks, hard candy, and chewing gum. This information is not intended to replace advice given to you by your health care provider. Make sure you discuss any questions you have with your health care provider. Document Released: 03/08/2016 Document Revised: 03/08/2016 Document Reviewed: 03/08/2016 Elsevier Interactive Patient Education  2019 [employer]. Diverticulitis  Diverticulitis is infection or inflammation of small pouches (diverticula) in the colon that form due to a condition called diverticulosis. Diverticula can trap stool (feces) and bacteria, causing infection and inflammation. Diverticulitis may cause severe stomach pain and diarrhea. It may lead to tissue damage in the colon that causes bleeding. The diverticula may also burst (rupture) and cause infected stool to enter other areas of the abdomen. Complications of diverticulitis can include:  Bleeding.  Severe infection.  Severe pain.  Rupture (perforation) of the colon.  Blockage (obstruction) of the colon. What are the causes? This condition is caused by stool becoming trapped in the diverticula, which allows bacteria to grow in the diverticula. This leads to inflammation and infection. What increases the risk? You are  more likely to develop this condition if:  You have diverticulosis. The risk for diverticulosis increases if: ? You are  overweight or obese. ? You use tobacco products. ? You do not get enough exercise.  You eat a diet that does not include enough fiber. High-fiber foods include fruits, vegetables, beans, nuts, and whole grains. What are the signs or symptoms? Symptoms of this condition may include:  Pain and tenderness in the abdomen. The pain is normally located on the left side of the abdomen, but it may occur in other areas.  Fever and chills.  Bloating.  Cramping.  Nausea.  Vomiting.  Changes in bowel routines.  Blood in your stool. How is this diagnosed? This condition is diagnosed based on:  Your medical history.  A physical exam.  Tests to make sure there is nothing else causing your condition. These tests may include: ? Blood tests. ? Urine tests. ? Imaging tests of the abdomen, including X-rays, ultrasounds, MRIs, or CT scans. How is this treated? Most cases of this condition are mild and can be treated at home. Treatment may include:  Taking over-the-counter pain medicines.  Following a clear liquid diet.  Taking antibiotic medicines by mouth.  Rest. More severe cases may need to be treated at a hospital. Treatment may include:  Not eating or drinking.  Taking prescription pain medicine.  Receiving antibiotic medicines through an IV tube.  Receiving fluids and nutrition through an IV tube.  Surgery. When your condition is under control, your health care provider may recommend that you have a colonoscopy. This is an exam to look at the entire large intestine. During the exam, a lubricated, bendable tube is inserted into the anus and then passed into the rectum, colon, and other parts of the large intestine. A colonoscopy can show how severe your diverticula are and whether something else may be causing your symptoms. Follow these instructions at home: Medicines  Take over-the-counter and prescription medicines only as told by your health care provider. These include  fiber supplements, probiotics, and stool softeners.  If you were prescribed an antibiotic medicine, take it as told by your health care provider. Do not stop taking the antibiotic even if you start to feel better.  Do not drive or use heavy machinery while taking prescription pain medicine. General instructions   Follow a full liquid diet or another diet as directed by your health care provider. After your symptoms improve, your health care provider may tell you to change your diet. He or she may recommend that you eat a diet that contains at least 25 g (25 grams) of fiber daily. Fiber makes it easier to pass stool. Healthy sources of fiber include: ? Berries. One cup contains 4-8 grams of fiber. ? Beans or lentils. One half cup contains 5-8 grams of fiber. ? Green vegetables. One cup contains 4 grams of fiber.  Exercise for at least 30 minutes, 3 times each week. You should exercise hard enough to raise your heart rate and break a sweat.  Keep all follow-up visits as told by your health care provider. This is important. You may need a colonoscopy. Contact a health care provider if:  Your pain does not improve.  You have a hard time drinking or eating food.  Your bowel movements do not return to normal. Get help right away if:  Your pain gets worse.  Your symptoms do not get better with treatment.  Your symptoms suddenly get worse.  You have a fever.  You vomit more than one time.  You have stools that are bloody, black, or tarry. Summary  Diverticulitis is infection or inflammation of small pouches (diverticula) in the colon that form due to a condition called diverticulosis. Diverticula can trap stool (feces) and bacteria, causing infection and inflammation.  You are at higher risk for this condition if you have diverticulosis and you eat a diet that does not include enough fiber.  Most cases of this condition are mild and can be treated at home. More severe cases may need  to be treated at a hospital.  When your condition is under control, your health care provider may recommend that you have an exam called a colonoscopy. This exam can show how severe your diverticula are and whether something else may be causing your symptoms. This information is not intended to replace advice given to you by your health care provider. Make sure you discuss any questions you have with your health care provider. Document Released: 11/14/2004 Document Revised: 03/09/2016 Document Reviewed: 03/09/2016 Elsevier Interactive Patient Education  2019 [employer]  Acute Pancreatitis  The pancreas is a gland that is located behind the stomach on the left side of the abdomen. It produces enzymes that help to digest food. The pancreas also releases the hormones glucagon and insulin, which help to regulate blood sugar. Acute pancreatitis happens when inflammation of the pancreas suddenly occurs and the pancreas becomes irritated and swollen. Most acute attacks last a few days and cause serious problems. Some people become dehydrated and develop low blood pressure. In severe cases, bleeding in the abdomen can lead to shock and can be life-threatening. The lungs, heart, and kidneys may fail. What are the causes? This condition may be caused by:  Alcohol abuse.  Drug abuse.  Gallstones or other conditions that can block the tube that drains the pancreas (pancreatic duct).  A tumor in the pancreas. Other causes include:  Certain medicines.  Exposure to certain chemicals.  Diabetes.  An infection in the pancreas.  Damage caused by an accident (trauma).  The poison (venom) from a scorpion bite.  Abdominal surgery.  Autoimmune pancreatitis. This is when the body's disease-fighting (immune) system attacks the pancreas.  Genes that are passed from parent to child (inherited). In some cases, the cause of this condition is not known. What are the signs or symptoms? Symptoms of  this condition include:  Pain in the upper abdomen that may radiate to the back. Pain may be severe.  Tenderness and swelling of the abdomen.  Nausea and vomiting.  Fever. How is this diagnosed? This condition may be diagnosed based on:  A physical exam.  Blood tests.  Imaging tests, such as X-rays, CT or MRI scans, or an ultrasound of the abdomen. How is this treated? Treatment for this condition usually requires a stay in the hospital. Treatment for this condition may include:  Pain medicine.  Fluid replacement through an IV.  Placing a tube in the stomach to remove stomach contents and to control vomiting (NG tube, or nasogastric tube).  Not eating for 3-4 days. This gives the pancreas a rest, because enzymes are not being produced that can cause further damage.  Antibiotic medicines, if your condition is caused by an infection.  Treating any underlying conditions that may be the cause.  Steroid medicines, if your condition is caused by your immune system attacking your body's own tissues (autoimmune disease).  Surgery on the pancreas or gallbladder. Follow these instructions at home: Eating and  drinking   Follow instructions from your health care provider about diet. This may involve avoiding alcohol and decreasing the amount of fat in your diet.  Eat smaller, more frequent meals. This reduces the amount of digestive fluids that the pancreas produces.  Drink enough fluid to keep your urine pale yellow.  Do not drink alcohol if it caused your condition. General instructions  Take over-the-counter and prescription medicines only as told by your health care provider.  Do not drive or use heavy machinery while taking prescription pain medicine.  Ask your health care provider if the medicine prescribed to you can cause constipation. You may need to take steps to prevent or treat constipation, such as: ? Take an over-the-counter or prescription medicine for  constipation. ? Eat foods that are high in fiber such as whole grains and beans. ? Limit foods that are high in fat and processed sugars, such as fried or sweet foods.  Do not use any products that contain nicotine or tobacco, such as cigarettes, e-cigarettes, and chewing tobacco. If you need help quitting, ask your health care provider.  Get plenty of rest.  If directed, check your blood sugar at home as told by your health care provider.  Keep all follow-up visits as told by your health care provider. This is important. Contact a health care provider if you:  Do not recover as quickly as expected.  Develop new or worsening symptoms.  Have persistent pain, weakness, or nausea.  Recover and then have another episode of pain.  Have a fever. Get help right away if:  You cannot eat or keep fluids down.  Your pain becomes severe.  Your skin or the white part of your eyes turns yellow (jaundice).  You have sudden swelling in your abdomen.  You vomit.  You feel dizzy or you faint.  Your blood sugar is high (over 300 mg/dL). Summary  Acute pancreatitis happens when inflammation of the pancreas suddenly occurs and the pancreas becomes irritated and swollen.  This condition is typically caused by alcohol abuse, drug abuse, or gallstones.  Treatment for this condition usually requires a stay in the hospital. This information is not intended to replace advice given to you by your health care provider. Make sure you discuss any questions you have with your health care provider. Document Released: 02/04/2005 Document Revised: 08/11/2017 Document Reviewed: 08/11/2017 Elsevier Interactive Patient Education  2019 [employer].

## 2018-05-12 NOTE — Progress Notes (Signed)
Subjective:    Patient ID: Harold [name], male    DOB: 05/18/1955, 63 y.o.   MRN: [id number]  62y/o R sided abd pain x2 weeks, intermittently. 4-5/10 pain at it's worst. Has not noticed change in pain r/t eating, drinking. Has noticed that he has to lie on L side if lying down, as pain worsens when lying on R side. No change in BMs. + cases of coworkers seen in clinic with GI symptoms in past 2 weeks Patient reported he typically snacks on peanuts whole prior to bedtime.  He has been having BM looser than usual in the evenings.  Denied pain waking him up in the middle of the night but he has had gas which usually preceeds a BM so he gets up to bathroom.  Denied black/tarry or bright red stools.  Colonoscopy last year told he doesn't need another for 10 years.  Denied history of diverticulitis.  Denied abdomen pain starting before or after meals.  Denied changes in color of stools.  Denied heartburn or reflux symptoms.  Denied nausea/vomiting.  Review of Systems  Constitutional: Negative for activity change, appetite change, chills, diaphoresis, fatigue and fever.  HENT: Negative for congestion, trouble swallowing and voice change.   Eyes: Negative for photophobia and visual disturbance.  Respiratory: Negative for cough, shortness of breath, wheezing and stridor.   Cardiovascular: Negative for chest pain, palpitations and leg swelling.  Gastrointestinal: Positive for abdominal distention and abdominal pain. Negative for anal bleeding, blood in stool, constipation, nausea and vomiting.  Endocrine: Negative for cold intolerance, heat intolerance, polydipsia, polyphagia and polyuria.  Genitourinary: Negative for decreased urine volume, difficulty urinating, dysuria, flank pain, frequency and hematuria.  Musculoskeletal: Negative for neck pain and neck stiffness.  Skin: Negative for color change, pallor, rash and wound.  Allergic/Immunologic: Positive for food allergies and immunocompromised  state.  Neurological: Negative for dizziness, tremors, seizures, syncope, facial asymmetry, speech difficulty, weakness, light-headedness, numbness and headaches.  Hematological: Negative for adenopathy. Does not bruise/bleed easily.  Psychiatric/Behavioral: Negative for agitation and confusion.       Objective:   Physical Exam Vitals signs and nursing note reviewed.  Constitutional:      General: He is awake. He is not in acute distress.    Appearance: Normal appearance. He is well-developed and well-groomed. He is obese. He is not ill-appearing, toxic-appearing or diaphoretic.  HENT:     Head: Normocephalic and atraumatic.     Jaw: There is normal jaw occlusion.     Salivary Glands: Right salivary gland is not diffusely enlarged or tender. Left salivary gland is not diffusely enlarged or tender.     Comments: Bilateral lower eyelids nonpitting edema 1+/4; bilateral allergic shiners; cobblestoning posterior pharynx;     Right Ear: External ear normal.     Left Ear: External ear normal.     Nose: Nose normal. No congestion or rhinorrhea.     Mouth/Throat:     Mouth: Mucous membranes are moist.     Pharynx: No pharyngeal swelling or oropharyngeal exudate.  Eyes:     General: Lids are normal. Allergic shiner present. No visual field deficit or scleral icterus.       Right eye: No discharge.        Left eye: No discharge.     Extraocular Movements: Extraocular movements intact.     Conjunctiva/sclera: Conjunctivae normal.     Pupils: Pupils are equal, round, and reactive to light.  Neck:     Musculoskeletal: Normal range  of motion and neck supple. No neck rigidity.     Trachea: Trachea and phonation normal.  Cardiovascular:     Rate and Rhythm: Normal rate and regular rhythm.     Pulses: Normal pulses.          Carotid pulses are 2+ on the right side.      Radial pulses are 2+ on the right side.     Heart sounds: Normal heart sounds.     Comments: Bilateral lower extremities  hemosiderin hyperpigmentation anterior shins Pulmonary:     Effort: Pulmonary effort is normal. No respiratory distress.     Breath sounds: Normal breath sounds and air entry. No stridor, decreased air movement or transmitted upper airway sounds. No decreased breath sounds, wheezing, rhonchi or rales.     Comments: No cough observed in exam room spoke full sentences without difficulty Chest:     Chest wall: No tenderness.  Abdominal:     General: Bowel sounds are normal. There is distension. There are no signs of injury.     Palpations: Abdomen is soft. There is no shifting dullness, fluid wave or pulsatile mass.     Tenderness: There is abdominal tenderness in the right upper quadrant. There is no right CVA tenderness, left CVA tenderness, guarding or rebound. Negative signs include Murphy's sign.     Hernia: No hernia is present. There is no hernia in the umbilical area or ventral area.    Musculoskeletal: Normal range of motion.     Right shoulder: Normal.     Left shoulder: Normal.     Right elbow: Normal.    Left elbow: Normal.     Right hip: Normal.     Left hip: Normal.     Right knee: Normal.     Left knee: Normal.     Right ankle: Normal.     Left ankle: Normal.     Cervical back: Normal.     Thoracic back: Normal.     Lumbar back: Normal.     Right hand: Normal.     Left hand: Normal.     Right lower leg: No edema.     Left lower leg: No edema.     Comments: Patient requested assist to sit up from supine; standing to sitting to supine on exam table without assist easily; gait sure and steady in hallway; in/out of chair without difficulty  Lymphadenopathy:     Head:     Right side of head: No submental, submandibular, tonsillar, preauricular, posterior auricular or occipital adenopathy.     Left side of head: No submental, submandibular, tonsillar, preauricular, posterior auricular or occipital adenopathy.     Cervical: No cervical adenopathy.     Right cervical: No  superficial cervical adenopathy.    Left cervical: No superficial cervical adenopathy.  Skin:    General: Skin is warm and dry.     Capillary Refill: Capillary refill takes less than 2 seconds.     Coloration: Skin is not ashen, cyanotic, jaundiced, mottled, pale or sallow.     Findings: Rash present. No erythema. Rash is macular. Rash is not crusting, nodular, papular, purpuric, pustular, scaling, urticarial or vesicular.     Nails: There is no clubbing.           Comments: Hyperpigmentation brown anterior shins grouped linear 3x5cm right and 2x3 cm left  Neurological:     General: No focal deficit present.     Mental Status: He is alert and oriented  to person, place, and time. Mental status is at baseline.     GCS: GCS eye subscore is 4. GCS verbal subscore is 5. GCS motor subscore is 6.     Cranial Nerves: Cranial nerves are intact. No cranial nerve deficit, dysarthria or facial asymmetry.     Sensory: Sensation is intact.     Motor: Motor function is intact. No weakness, tremor, atrophy, abnormal muscle tone or seizure activity.     Coordination: Coordination is intact. Coordination normal.     Gait: Gait is intact. Gait normal.     Comments: Bilateral hand grasp equal 5/5; on/off exam table and in/out of chair without difficulty; gait sure and steady hallway/clinic  Psychiatric:        Attention and Perception: Attention and perception normal.        Mood and Affect: Mood and affect normal.        Speech: Speech normal.        Behavior: Behavior normal. Behavior is cooperative.        Thought Content: Thought content normal.        Cognition and Memory: Cognition and memory normal.        Judgment: Judgment normal.       RUQ tenderness and bloating abdomen mild; dull to percussion x 4 quads; normoactive bowel sounds x 4 quads; assist to lie down and sit up on exam table; hemosiderin deposits bilateral lower legs; no pedal edema  Discussed dipstick urinalysis results with  patient on invokana expected glucose in urine and chronic kidney disease protein  No infection noted negative nitrites/leukocytes  Patient notified in clinic and had no further questions at this time.    Assessment & Plan:  A-abdomen pain and bloating  P-labs nonfasting patient drank mountain dew but no solids yet this am; ordered CMET, CBC, amylase/lipase and dipistick urinalysis today. DDx diverticulitis, viral gastroenteritis, colitis acute, acute pancreatitis, hepatitis  Discussed with patient checking for infection, elevated liver or pancreas enzymes today.  Urinalysis unremarkable in clinic.  GI viral illness has been seen in other employees diarrhea/nausea/vomiting in previous month.  RN Hildred Alamin had also received orders from cardiology for labs all drawn today.  I  will call patient tomorrow at work after 1200 or on cell phone prior to 1200 with lab results patient stated to leave voicemail if he does not answer. Patient  Allergic to augmentin and bactrim.  Unable to take cipro/levofloxacin due to medications interactions.  Moxifloxacin discussed could increase or decrease blood sugars patient on continuous glucose monitoring freestyle libre if moxifloxin prescribed tomorrow for diverticulitis along with flagyl he would need to pick up from [employer].  Discussed if worsening symptoms tonight e.g pain, black/bright red stools, fever/chills/vomiting he is to seek re-evaluation with a provider.  [employer] handout on pancreatitis, abdomen pain/bloating and diverticulitis printed and given to patient.  Patient to avoid eating peanuts for bedtime snack tonight and discussed avoid foods with seeds today.  Patient verbalized understanding information/instructions, agreed with plan of care and had no further questions at this time.

## 2018-05-13 ENCOUNTER — Telehealth: Payer: Self-pay | Admitting: Registered Nurse

## 2018-05-13 ENCOUNTER — Encounter: Payer: Self-pay | Admitting: Registered Nurse

## 2018-05-13 LAB — CBC WITH DIFFERENTIAL/PLATELET
Basophils Absolute: 0.1 10*3/uL (ref 0.0–0.2)
Basos: 2 %
EOS (ABSOLUTE): 0.2 10*3/uL (ref 0.0–0.4)
EOS: 3 %
Hematocrit: 47.1 % (ref 37.5–51.0)
Hemoglobin: 16.5 g/dL (ref 13.0–17.7)
Immature Grans (Abs): 0 10*3/uL (ref 0.0–0.1)
Immature Granulocytes: 0 %
LYMPHS ABS: 1.6 10*3/uL (ref 0.7–3.1)
Lymphs: 29 %
MCH: 29.4 pg (ref 26.6–33.0)
MCHC: 35 g/dL (ref 31.5–35.7)
MCV: 84 fL (ref 79–97)
MONOCYTES: 8 %
Monocytes Absolute: 0.5 10*3/uL (ref 0.1–0.9)
Neutrophils Absolute: 3.2 10*3/uL (ref 1.4–7.0)
Neutrophils: 58 %
PLATELETS: 170 10*3/uL (ref 150–450)
RBC: 5.61 x10E6/uL (ref 4.14–5.80)
RDW: 14.4 % (ref 11.6–15.4)
WBC: 5.6 10*3/uL (ref 3.4–10.8)

## 2018-05-13 LAB — COMPREHENSIVE METABOLIC PANEL
A/G RATIO: 1.9 (ref 1.2–2.2)
ALK PHOS: 87 IU/L (ref 39–117)
ALT: 23 IU/L (ref 0–44)
AST: 22 IU/L (ref 0–40)
Albumin: 4.2 g/dL (ref 3.8–4.8)
BILIRUBIN TOTAL: 0.3 mg/dL (ref 0.0–1.2)
BUN/Creatinine Ratio: 17 (ref 10–24)
BUN: 26 mg/dL (ref 8–27)
CALCIUM: 9.5 mg/dL (ref 8.6–10.2)
CHLORIDE: 96 mmol/L (ref 96–106)
CO2: 24 mmol/L (ref 20–29)
Creatinine, Ser: 1.57 mg/dL — ABNORMAL HIGH (ref 0.76–1.27)
GFR calc Af Amer: 53 mL/min/{1.73_m2} — ABNORMAL LOW (ref >59)
GFR, EST NON AFRICAN AMERICAN: 46 mL/min/{1.73_m2} — AB (ref >59)
GLOBULIN, TOTAL: 2.2 g/dL (ref 1.5–4.5)
GLUCOSE: 203 mg/dL — AB (ref 65–99)
POTASSIUM: 3.7 mmol/L (ref 3.5–5.2)
SODIUM: 141 mmol/L (ref 134–144)
TOTAL PROTEIN: 6.4 g/dL (ref 6.0–8.5)

## 2018-05-13 LAB — LIPASE: LIPASE: 43 U/L (ref 13–78)

## 2018-05-13 LAB — AMYLASE: Amylase: 64 U/L (ref 31–110)

## 2018-05-13 LAB — LIPID PANEL
CHOLESTEROL TOTAL: 178 mg/dL (ref 100–199)
Chol/HDL Ratio: 4.2 ratio (ref 0.0–5.0)
HDL: 42 mg/dL (ref >39)
LDL CALC: 67 mg/dL (ref 0–99)
TRIGLYCERIDES: 343 mg/dL — AB (ref 0–149)
VLDL Cholesterol Cal: 69 mg/dL — ABNORMAL HIGH (ref 5–40)

## 2018-05-13 NOTE — Telephone Encounter (Signed)
Left message for patient to call me.  Lab results unremarkable CBC normal, CMET renal function improved from previous, LFTs and electrolytes WNL this was a nonfasting draw expected glucose to be elevated as had been drinking sweetened beverage.  Amylase and lipase WNL.  Case discussed with Dr Rosanna Randy would be unusual presentation of diverticulitis could treat with moxifloxacin due to medication allergies per Up to Date guidelines also; watchful waiting to see if any changes in symptoms; consider musculoskeletal etiology as location near ribs and viral gastroenteritis since coworkers with similar symptoms at place of employment.  Would be atypical presentation of cholecystitis referred pain but his symptoms do not seem to be related to oral intake timing.  Consider obtaining colonoscopy report from his provider/PCM to verify if diverticula seen during exam.  See lab result note dated today.  1010 Patient returned call stated same pain didn't eat peanuts last night.  Denied fever/chills/nausea/vomiting/bloody stools.  Pain still RUQ worsens with rolling onto right side.  Had same softer stool that has occurred the previous couple days. Blood sugars his usual.   Reviewed lab results with patient, differential diagnoses described in initial annotation and Dr Rosanna Randy recommendations.  Patient will continue to monitor symptoms to see if any correlation with oral intake.  Patient going to avoid spicy, fried, poor dietary choices as snacks and peanuts.  Heading to work after we spoke.  Increased stress due to Covid 19 restrictions at work and in community.  He will notify me if fever/chills/worsening abdomen pain, nausea/vomiting/hematochezia He does not want to start moxifloxacin at this time.  Will modify diet and monitor symptoms.   Patient verbalized understanding of lab results discussed essentially normal except kidney function chronically elevated and triglycerides/VLDL follow up with cardiology.  Patient had no  further questions at this time and agreed with plan of care.

## 2018-05-14 ENCOUNTER — Ambulatory Visit: Payer: PRIVATE HEALTH INSURANCE | Admitting: Family Medicine

## 2018-05-14 NOTE — Progress Notes (Signed)
Results reviewed with pt via phone. Advised to turn in Rx for Vascepa and start that per cardiology OV ntoes in January 2020 if triglycerides were still >200, then initiate med. Avoid dehydration and otc meds/supplements in setting of stable CKD. Pt verbalizes understanding and agreement with this. Pt continuing diet modifications, avoiding possible diverticulitis triggers. Denies any current pain. Feels well. Plans to f/u with RN tomorrow for weekly BP check. Will provide hard copy of results to pt at that time if requested as pt has active MyChart. No further questions/concerns currently.

## 2018-05-14 NOTE — Progress Notes (Signed)
noted

## 2018-05-15 ENCOUNTER — Ambulatory Visit: Payer: Self-pay | Admitting: *Deleted

## 2018-05-15 ENCOUNTER — Other Ambulatory Visit: Payer: Self-pay

## 2018-05-15 VITALS — BP 142/82 | HR 68 | Wt 314.0 lb

## 2018-05-15 DIAGNOSIS — Z794 Long term (current) use of insulin: Principal | ICD-10-CM

## 2018-05-15 DIAGNOSIS — I10 Essential (primary) hypertension: Secondary | ICD-10-CM

## 2018-05-15 DIAGNOSIS — E118 Type 2 diabetes mellitus with unspecified complications: Secondary | ICD-10-CM

## 2018-05-15 NOTE — Progress Notes (Signed)
BG 114 currently. >230 this morning. Took injections.  Feels well. No abd pain currently. Did snack on peanuts last night. Reports yesterday, prior to peanuts, he noticed pain would has occur in L lower quadrant area. He had a couple of episodes of diarrhea as well during this pain. Both resolved overnight.  Release of information consent obtained to request colonoscopy report from [facility], Dr. Benson Norway, performed 2019.

## 2018-05-18 ENCOUNTER — Ambulatory Visit: Payer: Self-pay | Admitting: *Deleted

## 2018-05-18 ENCOUNTER — Other Ambulatory Visit: Payer: Self-pay

## 2018-05-18 VITALS — BP 134/80 | HR 63 | Wt 314.0 lb

## 2018-05-18 DIAGNOSIS — E118 Type 2 diabetes mellitus with unspecified complications: Secondary | ICD-10-CM

## 2018-05-18 DIAGNOSIS — R1011 Right upper quadrant pain: Secondary | ICD-10-CM

## 2018-05-18 DIAGNOSIS — Z794 Long term (current) use of insulin: Principal | ICD-10-CM

## 2018-05-18 DIAGNOSIS — I10 Essential (primary) hypertension: Secondary | ICD-10-CM

## 2018-05-18 NOTE — Progress Notes (Signed)
LUQ abd pain continues intermittently. Did have more peanuts over the weekend. "Not as many as I normally eat though." Pt advised that any quantity of intake can affect this. 3 episodes loose, non-bloody stools Friday evening. No response from [facility] office yet for colonoscopy results and ROI sent Friday afternoon 05/15/18.

## 2018-05-26 ENCOUNTER — Other Ambulatory Visit: Payer: Self-pay

## 2018-05-26 ENCOUNTER — Encounter: Payer: Self-pay | Admitting: Registered Nurse

## 2018-05-26 ENCOUNTER — Ambulatory Visit: Payer: Self-pay | Admitting: Registered Nurse

## 2018-05-26 VITALS — BP 137/81 | HR 69 | Temp 97.5°F | Wt 319.0 lb

## 2018-05-26 DIAGNOSIS — E860 Dehydration: Secondary | ICD-10-CM

## 2018-05-26 DIAGNOSIS — R1011 Right upper quadrant pain: Secondary | ICD-10-CM

## 2018-05-26 DIAGNOSIS — M7989 Other specified soft tissue disorders: Secondary | ICD-10-CM

## 2018-05-26 DIAGNOSIS — R1032 Left lower quadrant pain: Secondary | ICD-10-CM

## 2018-05-26 DIAGNOSIS — E1149 Type 2 diabetes mellitus with other diabetic neurological complication: Secondary | ICD-10-CM

## 2018-05-26 NOTE — Patient Instructions (Signed)
High-Fiber Diet Fiber, also called dietary fiber, is a type of carbohydrate that is found in fruits, vegetables, whole grains, and beans. A high-fiber diet can have many health benefits. Your health care provider may recommend a high-fiber diet to help:  Prevent constipation. Fiber can make your bowel movements more regular.  Lower your cholesterol.  Relieve the following conditions: ? Swelling of veins in the anus (hemorrhoids). ? Swelling and irritation (inflammation) of specific areas of the digestive tract (uncomplicated diverticulosis). ? A problem of the large intestine (colon) that sometimes causes pain and diarrhea (irritable bowel syndrome, IBS).  Prevent overeating as part of a weight-loss plan.  Prevent heart disease, type 2 diabetes, and certain cancers. What is my plan? The recommended daily fiber intake in grams (g) includes:  38 g for men age 33 or younger.  30 g for men over age 54.  54 g for women age 29 or younger.  21 g for women over age 42. You can get the recommended daily intake of dietary fiber by:  Eating a variety of fruits, vegetables, grains, and beans.  Taking a fiber supplement, if it is not possible to get enough fiber through your diet. What do I need to know about a high-fiber diet?  It is better to get fiber through food sources rather than from fiber supplements. There is not a lot of research about how effective supplements are.  Always check the fiber content on the nutrition facts label of any prepackaged food. Look for foods that contain 5 g of fiber or more per serving.  Talk with a diet and nutrition specialist (dietitian) if you have questions about specific foods that are recommended or not recommended for your medical condition, especially if those foods are not listed below.  Gradually increase how much fiber you consume. If you increase your intake of dietary fiber too quickly, you may have bloating, cramping, or gas.  Drink plenty  of water. Water helps you to digest fiber. What are tips for following this plan?  Eat a wide variety of high-fiber foods.  Make sure that half of the grains that you eat each day are whole grains.  Eat breads and cereals that are made with whole-grain flour instead of refined flour or white flour.  Eat brown rice, bulgur wheat, or millet instead of white rice.  Start the day with a breakfast that is high in fiber, such as a cereal that contains 5 g of fiber or more per serving.  Use beans in place of meat in soups, salads, and pasta dishes.  Eat high-fiber snacks, such as berries, raw vegetables, nuts, and popcorn.  Choose whole fruits and vegetables instead of processed forms like juice or sauce. What foods can I eat?  Fruits Berries. Pears. Apples. Oranges. Avocado. Prunes and raisins. Dried figs. Vegetables Sweet potatoes. Spinach. Kale. Artichokes. Cabbage. Broccoli. Cauliflower. Green peas. Carrots. Squash. Grains Whole-grain breads. Multigrain cereal. Oats and oatmeal. Brown rice. Barley. Bulgur wheat. [place]. Quinoa. Bran muffins. Popcorn. Rye wafer crackers. Meats and other proteins Navy, kidney, and pinto beans. Soybeans. Split peas. Lentils. Nuts and seeds. Dairy Fiber-fortified yogurt. Beverages Fiber-fortified soy milk. Fiber-fortified orange juice. Other foods Fiber bars. The items listed above may not be a complete list of recommended foods and beverages. Contact a dietitian for more options. What foods are not recommended? Fruits Fruit juice. Cooked, strained fruit. Vegetables Fried potatoes. Canned vegetables. Well-cooked vegetables. Grains White bread. Pasta made with refined flour. White rice. Meats and other  proteins Fatty cuts of meat. Fried chicken or fried fish. Dairy Milk. Yogurt. Cream cheese. Sour cream. Fats and oils Butters. Beverages Soft drinks. Other foods Cakes and pastries. The items listed above may not be a complete list of foods  and beverages to avoid. Contact a dietitian for more information. Summary  Fiber is a type of carbohydrate. It is found in fruits, vegetables, whole grains, and beans.  There are many health benefits of eating a high-fiber diet, such as preventing constipation, lowering blood cholesterol, helping with weight loss, and reducing your risk of heart disease, diabetes, and certain cancers.  Gradually increase your intake of fiber. Increasing too fast can result in cramping, bloating, and gas. Drink plenty of water while you increase your fiber.  The best sources of fiber include whole fruits and vegetables, whole grains, nuts, seeds, and beans. This information is not intended to replace advice given to you by your health care provider. Make sure you discuss any questions you have with your health care provider. Document Released: 02/04/2005 Document Revised: 12/09/2016 Document Reviewed: 12/09/2016 Elsevier Interactive Patient Education  2019 [employer] Colitis  Colitis is inflammation of the colon. Colitis may last a short time (be acute), or it may last a long time (become chronic). What are the causes? This condition may be caused by:  Viruses.  Bacteria.  Reaction to medicine.  Certain autoimmune diseases such as Crohn's disease or ulcerative colitis.  Radiation treatment.  Decreased blood flow to the bowel (ischemia). What are the signs or symptoms? Symptoms of this condition include:  Watery diarrhea.  Passing bloody or tarry stool.  Pain.  Fever.  Vomiting.  Tiredness (fatigue).  Weight loss.  Bloating.  Abdominal pain.  Having fewer bowel movements than usual.  A strong and sudden urge to have a bowel movement.  Feeling like the bowel is not empty after a bowel movement. How is this diagnosed? This condition is diagnosed with a stool test or a blood test. You may also have other tests, such as:  X-rays.  CT scan.  Colonoscopy.  Endoscopy.   Biopsy. How is this treated? Treatment for this condition depends on the cause. The condition may be treated by:  Resting the bowel. This involves not eating or drinking for a period of time.  Fluids that are given through an IV.  Medicine for pain and diarrhea.  Antibiotic medicines.  Cortisone medicines.  Surgery. Follow these instructions at home: Eating and drinking   Follow instructions from your health care provider about eating or drinking restrictions.  Drink enough fluid to keep your urine pale yellow.  Work with a dietitian to determine which foods cause your condition to flare up.  Avoid foods that cause flare-ups.  Eat a well-balanced diet. General instructions  If you were prescribed an antibiotic medicine, take it as told by your health care provider. Do not stop taking the antibiotic even if you start to feel better.  Take over-the-counter and prescription medicines only as told by your health care provider.  Keep all follow-up visits as told by your health care provider. This is important. Contact a health care provider if:  Your symptoms do not go away.  You develop new symptoms. Get help right away if you:  Have a fever that does not go away with treatment.  Develop chills.  Have extreme weakness, fainting, or dehydration.  Have repeated vomiting.  Develop severe pain in your abdomen.  Pass bloody or tarry stool. Summary  Colitis is  inflammation of the colon. Colitis may last a short time (be acute), or it may last a long time (become chronic).  Treatment for this condition depends on the cause and may include resting the bowel, taking medicines, or having surgery.  If you were prescribed an antibiotic medicine, take it as told by your health care provider. Do not stop taking the antibiotic even if you start to feel better.  Get help right away if you develop severe pain in your abdomen.  Keep all follow-up visits as told by your health  care provider. This is important. This information is not intended to replace advice given to you by your health care provider. Make sure you discuss any questions you have with your health care provider. Document Released: 03/14/2004 Document Revised: 08/07/2017 Document Reviewed: 08/07/2017 Elsevier Interactive Patient Education  2019 [employer].

## 2018-05-26 NOTE — Progress Notes (Signed)
Subjective:    Patient ID: Harold [name], male    DOB: 03-Nov-1955, 63 y.o.   MRN: [id number]  63y/o caucasian male established patient here for right upper quadrant pain follow up.  Patient reports he is now also having intermittent left lower quadrant pain and he restarted nuts and has been stress snacking.  Covid 19 pandemic/news increasing his stress/anxiety.  Still working but stayed home the past few days not feeling well.  Having daily looser stools sometimes diarrhea.  Has noticed some lightheadedness with sitting up too quickly or standing. Denied fever/chills/vomiting/nausea/stool color changes. Still feeling tender lateral RUQ denied increased bloating. Didn't eat breakfast today or yesterday.  Lunch was ham with mayonnaise.  Dinner was salad lettuce, pickled eggs and cheese and pork with beans beans rinsed before eating per patient.  Today here had not eaten or drank anything this morning.  Stated his workplace has increased distancing between employees even further at new workstation with stool now and unable to prop up legs.  Urine yellow per patient voiding without difficulty.  Still been drinking fluids but maybe not as much as usual.  Glucose readings over the past week variable but higher on average than they have been, between 130-350 on average. One reading 471, then a "HI", which he believes is >500. Took insulin after this reading, but did not recheck reading until the next morning. Cautioned him against taking insulin and going to bed without rechecking. Pt reports 200 fasting this morning now.  Patient is wearing his continuous monitoring patch and new one applied yesterday.  Viewed log on patient phone.  Patient had signed release at last visit for colonoscopy results and they were faxed to [facility] office and placed in his paper record at [facility] clinic and results reviewed with patient again as he thought he was not due another colonoscopy for 10 years but report stated follow up in 3  years due to 5-6 polyps removed.  Reason for colonoscopy was diarrhea r/o microcolitis Mar 2018. Pathology negative for malignancy.     Review of Systems  Constitutional: Positive for activity change and fatigue. Negative for appetite change, chills, diaphoresis, fever and unexpected weight change.  HENT: Positive for congestion and postnasal drip. Negative for dental problem, drooling, ear discharge, ear pain, facial swelling, hearing loss, mouth sores, nosebleeds, sinus pressure, sinus pain, sore throat, tinnitus, trouble swallowing and voice change.   Eyes: Negative for photophobia and visual disturbance.  Respiratory: Negative for cough, choking, chest tightness, shortness of breath, wheezing and stridor.   Cardiovascular: Positive for leg swelling. Negative for chest pain and palpitations.  Gastrointestinal: Positive for abdominal distention, abdominal pain and diarrhea. Negative for anal bleeding, blood in stool, constipation, nausea, rectal pain and vomiting.  Endocrine: Negative for cold intolerance and heat intolerance.  Genitourinary: Negative for difficulty urinating.  Musculoskeletal: Negative for gait problem, neck pain and neck stiffness.  Allergic/Immunologic: Positive for environmental allergies, food allergies and immunocompromised state.  Neurological: Positive for light-headedness and headaches. Negative for dizziness, tremors, seizures, syncope, facial asymmetry, speech difficulty, weakness and numbness.  Hematological: Negative for adenopathy. Does not bruise/bleed easily.  Psychiatric/Behavioral: Negative for agitation, behavioral problems, confusion and sleep disturbance.       Objective:   Physical Exam Vitals signs and nursing note reviewed.  Constitutional:      General: He is awake. He is not in acute distress.    Appearance: Normal appearance. He is well-developed and well-groomed. He is obese. He is not ill-appearing, toxic-appearing  or diaphoretic.  HENT:      Head: Normocephalic and atraumatic.     Jaw: There is normal jaw occlusion.     Salivary Glands: Right salivary gland is not diffusely enlarged or tender. Left salivary gland is not diffusely enlarged or tender.     Right Ear: Hearing, tympanic membrane, ear canal and external ear normal. There is no impacted cerumen.     Left Ear: Hearing, tympanic membrane, ear canal and external ear normal. There is no impacted cerumen.     Nose: Nose normal. No congestion or rhinorrhea.     Right Turbinates: Not enlarged, swollen or pale.     Left Turbinates: Not enlarged, swollen or pale.     Right Sinus: No maxillary sinus tenderness or frontal sinus tenderness.     Left Sinus: No maxillary sinus tenderness or frontal sinus tenderness.     Mouth/Throat:     Lips: Pink. No lesions.     Mouth: Mucous membranes are moist. No oral lesions or angioedema.     Tongue: No lesions.     Pharynx: Uvula midline. No pharyngeal swelling, oropharyngeal exudate, posterior oropharyngeal erythema or uvula swelling.     Tonsils: No tonsillar exudate. 0 on the right. 0 on the left.     Comments: Bilateral allergic shiners; lower eyelids 1+/4 nonpitting edema; bilateral TMs air fluid level clear Eyes:     General: Allergic shiner present. No visual field deficit or scleral icterus.       Right eye: No discharge.        Left eye: No discharge.     Extraocular Movements: Extraocular movements intact.     Conjunctiva/sclera: Conjunctivae normal.     Right eye: Right conjunctiva is not injected. No chemosis or exudate.    Left eye: Left conjunctiva is not injected. No chemosis or exudate.    Pupils: Pupils are equal, round, and reactive to light.  Neck:     Musculoskeletal: Normal range of motion and neck supple. Normal range of motion. No edema, erythema, neck rigidity, crepitus, injury, pain with movement, torticollis or muscular tenderness.     Trachea: Trachea and phonation normal.  Cardiovascular:     Rate and  Rhythm: Normal rate and regular rhythm.     Pulses: Normal pulses.          Radial pulses are 2+ on the right side and 2+ on the left side.     Heart sounds: Normal heart sounds. No murmur. No friction rub. No gallop.   Pulmonary:     Effort: Pulmonary effort is normal. No respiratory distress.     Breath sounds: Normal breath sounds and air entry. No stridor, decreased air movement or transmitted upper airway sounds. No decreased breath sounds, wheezing, rhonchi or rales.  Abdominal:     General: Bowel sounds are decreased. There is distension.     Palpations: Abdomen is soft. There is no fluid wave, mass or pulsatile mass.     Tenderness: There is abdominal tenderness in the right upper quadrant and left lower quadrant. There is no right CVA tenderness, left CVA tenderness, guarding or rebound. Negative signs include Murphy's sign and McBurney's sign.     Hernia: No hernia is present. There is no hernia in the umbilical area or ventral area.  Musculoskeletal: Normal range of motion.        General: Swelling present. No tenderness, deformity or signs of injury.     Right shoulder: Normal.     Left  shoulder: Normal.     Right elbow: Normal.    Left elbow: Normal.     Right hip: Normal.     Left hip: Normal.     Right knee: Normal.     Left knee: Normal.     Cervical back: Normal.     Thoracic back: Normal.     Lumbar back: He exhibits no tenderness, no bony tenderness, no swelling, no deformity, no laceration and no spasm.     Right hand: Normal.     Left hand: Normal.     Right lower leg: He exhibits no tenderness, no bony tenderness, no deformity and no laceration. 1+ Edema present.     Left lower leg: He exhibits no tenderness, no bony tenderness, no deformity and no laceration. 1+ Edema present.  Lymphadenopathy:     Head:     Right side of head: No submental, submandibular, tonsillar, preauricular, posterior auricular or occipital adenopathy.     Left side of head: No submental,  submandibular, tonsillar, preauricular, posterior auricular or occipital adenopathy.     Cervical: No cervical adenopathy.     Right cervical: No superficial, deep or posterior cervical adenopathy.    Left cervical: No superficial, deep or posterior cervical adenopathy.  Skin:    General: Skin is warm and dry.     Capillary Refill: Capillary refill takes less than 2 seconds.     Coloration: Skin is not ashen, cyanotic, jaundiced, mottled, pale or sallow.     Findings: No abrasion, abscess, acne, bruising, burn, ecchymosis, erythema, signs of injury, laceration, lesion, petechiae, rash or wound.     Nails: There is no clubbing.   Neurological:     General: No focal deficit present.     Mental Status: He is alert and oriented to person, place, and time. Mental status is at baseline.     GCS: GCS eye subscore is 4. GCS verbal subscore is 5. GCS motor subscore is 6.     Cranial Nerves: Cranial nerves are intact. No cranial nerve deficit, dysarthria or facial asymmetry.     Sensory: Sensation is intact.     Motor: Motor function is intact. No weakness, tremor, atrophy, abnormal muscle tone or seizure activity.     Coordination: Coordination is intact. Coordination normal.     Gait: Gait is intact. Gait normal.     Comments: Gait sure and steady in hallway; in/out of chair without difficulty;   Psychiatric:        Attention and Perception: Attention and perception normal.        Mood and Affect: Mood normal.        Speech: Speech normal.        Behavior: Behavior normal. Behavior is cooperative.        Thought Content: Thought content normal.        Cognition and Memory: Cognition and memory normal.        Judgment: Judgment normal.       abd RUQ slight tenderness localized and fullness decreased compared to previous exam; no well circumscribed bubble today and slight tympanny RUQ and LUQ; percussion dull LLQ and RLQ 3x4cm area well circumscribed fullness TTP LLQ; hypoactive bowel sounds  x 4 quads.  Assist with sitting up from prone on exam table.  BLE 1+/4 edema nonpitting today and veins noted increased varicosities today.    Assessment & Plan:  A-type II diabetes uncontrolled, lower leg swelling, abdomen pain RUQ and LLQ intermittent acute, mild dehydration, anxiety  P-Received copy of his colonoscopy results for diarrhea r/o microcolitis and EGD Mar 2018 polyps 6 removed and due to repeat in 3 years per GI Mar 2021 patient notified will need to schedule repeat in 1 year with his GI provider.  printed and given exitcare colitis and high fiber handouts previously received diverticulitis and abdomen pain handouts.  Going home on [employer]MLA today to push fluids make healthier food choices discussed shrimp, tuna, eggs, salmon with patient instead of ham which is high salt every day with mayonnaise.  Increase dietary fiber. 30 minutes face to face with patient and greater than 50% of time spent counseling on diabetes/cardiac diet and ways to control anxiety discussed low impact exercises e.g. walking, heel pumps when sitting, seated low weights e.g. soup can for upper body.  Stress and poor dietary choices driving up blood sugars.  Discussed virtual hope box, breathe, other guided breathing free phone apps.  Patient stated when at home watching a movie is what relaxes him.  He has stopped watching or listening to the news in the background as it is increasing his stress and avoiding conversation centered on pandemic covid 19 with coworkers and neighbors as it tends to aggravate him with many people stating it is a fake infection or not as serious as medical people are claiming.  Discussed he needs to avoid dehydration and increase water consumption to keep urine clear yellow tinged versus gold/yellow as this can worsen blood sugars if also dehydrated. His diarrhea and elevated blood sugars have led to dehydration e.g. lightheadedness with quick position changes.   Patient had velveeta mac n cheese  microwave in lunch box today with 1 liter diet mountain dew, water bottle and ham/mayonnaise plus snacks.  Discussed with patient these are high in salt and carbs.  Consider adding vegetables or higher fiber bread/cracker options e.g. triscuit/wheat thins.  He was considering bland diet of eggs, mashed potatoes, ham and mayonnaise and avoiding bread with every meal. Last office visit 24 Mar with me. Labs at that time stable pancreatic/liver enzymes/CBC essentially normal  DDx hyperglycemia related stool changes/diarrhea, diverticulitis, viral gastroenteritis, or colitis acute.  GI viral illness has been seen in other employees diarrhea/nausea/vomiting in previous month. Discussed if worsening symptoms tonight e.g pain, black/bright red stools, fever/chills/vomiting he is to seek re-evaluation with a provider. Patient to avoid eating peanuts for bedtime snack tonight may have creamy peanut butter and discussed avoid foods with seeds today e.g. berries, tomatoes, cucumbers, nuts or dried seeds as these are frequently cited as aggravating factor with diverticulitis/colitis.  Patient verbalized understanding information/instructions, agreed with plan of care and had no further questions at this time.  Elevate legs when he gets home and consider application of compression hose knee high. Avoid prolonged dependent/hanging position for legs this afternoon.   Increase his water intake today and decrease salty foods today.  Patient verbalized understanding information/instructions, agreed with plan of care and had no further questions at this time.  Consider breathing/meditation apps/exercise instead of stress eating.  Watching movie also healthy distraction at home.  Take his medications as prescribed and continuing to limit anxiety triggers e.g. pandemic news shows on 24/7 tv and radio playing in the background.  Discussed exercise will also help his leg swelling and blood sugars because it aids glucose use by muscles  and uses up circulating stress hormones versus sitting inactive. Discussed just because today or yesterday was a bad day today is an opportunity for changes in his actions/decisions.  Patient  verbalized understanding information/instructions, agreed with plan of care and had no further questions at this time.

## 2018-05-28 ENCOUNTER — Telehealth: Payer: Self-pay | Admitting: Registered Nurse

## 2018-05-28 ENCOUNTER — Encounter: Payer: Self-pay | Admitting: Registered Nurse

## 2018-05-28 NOTE — Telephone Encounter (Signed)
Patient returned call stated his blood sugars still running high 386 when he woke up today and he did sliding scale insulin.  He has been trying to increase his water intake and taking his medications as prescribed and working on modifying his diet but he was notified this morning that his work schedule is changing and his work hours may be decreased next week also and this is stressful to him.  He denied abdomen pain this am but has had 4 stools "my usual lately".  Stated leg swelling resolved "I am not retaining water"  Feeling better today overall.  Denied fever/chills/n/v.  Patient is going to follow up with RN Hildred Alamin tomorrow for weight/blood pressure and review glucose monitoring log.  No changes to plan of care as previously discussed.  Patient verbalized understanding of information/instructions, agreed with plan of care and had no further questions at this time.

## 2018-05-28 NOTE — Telephone Encounter (Signed)
Telephone message left for patient to contact clinic or follow up with RN [facility] tomorrow regarding blood sugar readings and if any changes in GI symptoms/fever/chills/leg swelling/dyspnea/SOB.

## 2018-05-29 ENCOUNTER — Other Ambulatory Visit: Payer: Self-pay

## 2018-05-29 ENCOUNTER — Ambulatory Visit: Payer: Self-pay | Admitting: *Deleted

## 2018-05-29 VITALS — BP 141/79 | HR 67 | Wt 316.0 lb

## 2018-05-29 DIAGNOSIS — E118 Type 2 diabetes mellitus with unspecified complications: Secondary | ICD-10-CM

## 2018-05-29 DIAGNOSIS — Z794 Long term (current) use of insulin: Principal | ICD-10-CM

## 2018-05-29 DIAGNOSIS — I10 Essential (primary) hypertension: Secondary | ICD-10-CM

## 2018-05-29 NOTE — Progress Notes (Signed)
CBG 375 this am fasting. Currently after 40units and no food, 238.   No abd pain currently. Endorses loose, watery stools still. Once today, 3x yesterday. Feels well otherwise.

## 2018-06-08 ENCOUNTER — Telehealth: Payer: Self-pay | Admitting: Cardiology

## 2018-06-08 MED ORDER — OMEGA-3-ACID ETHYL ESTERS 1 G PO CAPS: 2.0000 g | ORAL_CAPSULE | Freq: Every day | ORAL | 1 refills | Status: DC

## 2018-06-08 NOTE — Telephone Encounter (Signed)
Attempted to contact patient to discuss, but he did not answer.   Unable to afford Vacepa, please advise on another medication.   Will route to MD, nurse, and PharmD.  Thanks!

## 2018-06-08 NOTE — Telephone Encounter (Signed)
This is the only one that is actually indicated.  The only one close to this is Lovaza 2g daily   Glenetta Hew, MD

## 2018-06-08 NOTE — Telephone Encounter (Signed)
I called patient and advised that we could try to send in Lovaza 2g daily, submitted RX to pharmacy.  LVM advising patient to call back if issues with this medication as well.   Left call back number if questions.

## 2018-06-08 NOTE — Telephone Encounter (Signed)
  Pt c/o medication issue:  1. Name of Medication: Icosapent Ethyl (VASCEPA) 1 g CAPS  2. How are you currently taking this medication (dosage and times per day)? As directed  3. Are you having a reaction (difficulty breathing--STAT)?  No  4. What is your medication issue? Patient cannot afford to buy this medication because his insurance is refusing to pay. He would like to know what to do. Please call.

## 2018-06-11 ENCOUNTER — Other Ambulatory Visit: Payer: Self-pay | Admitting: Internal Medicine

## 2018-06-11 ENCOUNTER — Inpatient Hospital Stay: Admission: RE | Admit: 2018-06-11 | Payer: No Typology Code available for payment source | Source: Ambulatory Visit

## 2018-06-11 DIAGNOSIS — R1011 Right upper quadrant pain: Secondary | ICD-10-CM

## 2018-06-11 DIAGNOSIS — R1031 Right lower quadrant pain: Secondary | ICD-10-CM

## 2018-06-11 DIAGNOSIS — R1012 Left upper quadrant pain: Secondary | ICD-10-CM

## 2018-06-11 DIAGNOSIS — R197 Diarrhea, unspecified: Secondary | ICD-10-CM

## 2018-06-16 ENCOUNTER — Other Ambulatory Visit: Payer: Self-pay

## 2018-06-16 ENCOUNTER — Ambulatory Visit
Admission: RE | Admit: 2018-06-16 | Discharge: 2018-06-16 | Disposition: A | Discharge status: Home / Self Care | Payer: No Typology Code available for payment source | Source: Ambulatory Visit | Attending: Internal Medicine | Admitting: Internal Medicine

## 2018-06-16 DIAGNOSIS — R1031 Right lower quadrant pain: Secondary | ICD-10-CM

## 2018-06-16 DIAGNOSIS — R1011 Right upper quadrant pain: Secondary | ICD-10-CM

## 2018-06-16 DIAGNOSIS — R197 Diarrhea, unspecified: Secondary | ICD-10-CM

## 2018-06-16 DIAGNOSIS — R1012 Left upper quadrant pain: Secondary | ICD-10-CM

## 2018-06-16 MED ORDER — IOPAMIDOL (ISOVUE-300) INJECTION 61%
100.0000 mL | Freq: Once | INTRAVENOUS | Status: AC | PRN
  Administered 2018-06-16: 100 mL via INTRAVENOUS

## 2018-07-22 ENCOUNTER — Other Ambulatory Visit (HOSPITAL_COMMUNITY): Payer: Self-pay | Admitting: Gastroenterology

## 2018-07-22 DIAGNOSIS — R1011 Right upper quadrant pain: Secondary | ICD-10-CM

## 2018-07-27 ENCOUNTER — Telehealth: Payer: Self-pay | Admitting: Neurology

## 2018-07-27 ENCOUNTER — Other Ambulatory Visit: Payer: Self-pay

## 2018-07-27 MED ORDER — CARVEDILOL 6.25 MG PO TABS: 6.2500 mg | ORAL_TABLET | Freq: Two times a day (BID) | ORAL | 2 refills | Status: DC

## 2018-07-27 NOTE — Telephone Encounter (Signed)
Called the patient back. We have not seen him since 2018. Advised the patient that we had openings with our NP and was able to get him in to be seen June 10 at 1:30 pm with check in of 1. Advised the patient to pull up at the front entrance to check in and bring a mask. Patient at the visit will need order written for supplies at which point we can transfer his care to [facility]. Pt states his husband's get his supplies from [place] and would prefer to switch to where he is.

## 2018-07-27 NOTE — Telephone Encounter (Signed)
Pt is asking if his CPAP supplies can be switched over to [place].  Pt has scheduled his annual follow up and is on wait list to be called if he can come earlier.

## 2018-07-27 NOTE — Telephone Encounter (Signed)
Rx(s) sent to pharmacy electronically.

## 2018-07-29 ENCOUNTER — Encounter: Payer: Self-pay | Admitting: Adult Health

## 2018-07-29 ENCOUNTER — Other Ambulatory Visit: Payer: Self-pay

## 2018-07-29 ENCOUNTER — Ambulatory Visit (INDEPENDENT_AMBULATORY_CARE_PROVIDER_SITE_OTHER): Payer: PRIVATE HEALTH INSURANCE | Admitting: Adult Health

## 2018-07-29 VITALS — BP 156/79 | HR 77 | Temp 97.8°F | Ht 73.0 in | Wt 313.8 lb

## 2018-07-29 DIAGNOSIS — G4733 Obstructive sleep apnea (adult) (pediatric): Secondary | ICD-10-CM

## 2018-07-29 DIAGNOSIS — Z9989 Dependence on other enabling machines and devices: Secondary | ICD-10-CM | POA: Diagnosis not present

## 2018-07-29 NOTE — Progress Notes (Signed)
PATIENT: Harold [name] DOB: [birth year]  REASON FOR VISIT: follow up HISTORY FROM: patient  HISTORY OF PRESENT ILLNESS: Today 07/29/18:  Harold [name] is a 63 year old male with a history of obstructive sleep apnea on CPAP.  He returns today for follow-up.  His CPAP download indicates that he use his machine every night for compliance of 100%.  He uses machine greater than 4 hours each night.  On average he uses his machine 11 hours and 35 minutes.  His residual AHI is 1.5 on 5 to 15 cm of water with EPR 3.  His leak in the 95th percentile at 30.7 L/min.  His primary reason for coming in today as he would like to change his DME company to [place].  He states that he is due for new supplies.  Which most likely explains why his mask is leaking.  HISTORY 12/05/2016 and I have the pleasure of seeing Harold [name] for CPAP compliance visit. The patient underwent a sleep study in March of this year which documented very fragmented sleep for the first half of the recorded night and much improvement after CPAP was initiated. He is now using an auto titrated between 5 and 15 cm water pressure with 3 cm EPR, average user time is 9 hours and 34 minutes, with a 90% compliance as he lost 3 days due to power outage in the wake of headache and [name]. The 91st percentile pressure needs is 10.5 cm water, the 95th percentile 39.9 L/m. The patient uses a large nasal  mask now at home which is not the mask he was originally given after the sleep study. He cannot recall which type the mask is but I will be happy to refill whatever he is most comfortable with.  REVIEW OF SYSTEMS: Out of a complete 14 system review of symptoms, the patient complains only of the following symptoms, and all other reviewed systems are negative.  Epworth sleepiness score is 10  ALLERGIES: Allergies  Allergen Reactions  . Septra [Bactrim] Itching  . Penicillins Hives    Allergy to All cillin drugs  Ancef given 9/24  with no obvious reaction  . Clopidogrel     NON RESPONDER  P2Y12  . Metformin And Related   . Other     Walnuts, pecans     HOME MEDICATIONS: Outpatient Medications Prior to Visit  Medication Sig Dispense Refill  . allopurinol (ZYLOPRIM) 100 MG tablet Take 100 mg by mouth daily.  1  . aspirin EC 81 MG tablet Take 1 tablet (81 mg total) by mouth daily. 90 tablet 3  . canagliflozin (INVOKANA) 100 MG TABS tablet 3 (three) times daily before meals.     . carvedilol (COREG) 6.25 MG tablet Take 1 tablet (6.25 mg total) by mouth 2 (two) times daily with a meal. 180 tablet 2  . COLCRYS 0.6 MG tablet Take 0.6 mg by mouth daily.    . Continuous Blood Gluc Receiver (FREESTYLE LIBRE 14 DAY READER) DEVI FreeStyle Libre 14 Day Reader  USE AS DIRECTED    . Continuous Blood Gluc Sensor (FREESTYLE LIBRE 14 DAY SENSOR) MISC APPLY 1 SENSOR EVERY 14 DAYS  11  . escitalopram (LEXAPRO) 20 MG tablet TAKE 1 TABLET (20 MG TOTAL) BY MOUTH DAILY. 30 tablet 1  . furosemide (LASIX) 80 MG tablet Take 160 mg by mouth 3 (three) times daily.  10  . Insulin Glargine (LANTUS SOLOSTAR) 100 UNIT/ML Solostar Pen     . levothyroxine (SYNTHROID, LEVOTHROID) 88  MCG tablet Take 88 mcg by mouth daily before breakfast.    . nitroGLYCERIN (NITROSTAT) 0.4 MG SL tablet PLACE 1 TAB UNDER THE TONGUE EVERY 5 MINUTES AS NEEDED FOR CHEST PAIN (SEVERE PRESSURE OR TIGHTNESS) 25 tablet 3  . NOVOLOG FLEXPEN 100 UNIT/ML FlexPen 60 Units 3 (three) times daily with meals.   3  . omeprazole (PRILOSEC) 20 MG capsule Take 20 mg by mouth daily.    . potassium chloride (MICRO-K) 10 MEQ CR capsule Take by mouth daily.  5  . rosuvastatin (CRESTOR) 10 MG tablet Take 10 mg by mouth daily.    . TRULICITY 1.5 BT/5.1VO SOPN Inject 1.5 mg into the skin once a week.  5  . XARELTO 2.5 MG TABS tablet TAKE 1 TABLET BY MOUTH TWICE A DAY 180 tablet 3  . rosuvastatin (CRESTOR) 20 MG tablet Take 10 mg by mouth daily.   5  . losartan (COZAAR) 25 MG tablet Take 1  tablet (25 mg total) by mouth daily. 90 tablet 3  . fluticasone (FLONASE) 50 MCG/ACT nasal spray USE 2 SPRAYS IN EACH NOSTRIL ONCE DAY    . omega-3 acid ethyl esters (LOVAZA) 1 g capsule Take 2 capsules (2 g total) by mouth daily. 90 capsule 1   No facility-administered medications prior to visit.     PAST MEDICAL HISTORY: Past Medical History:  Diagnosis Date  . Anginal pain [facility]) March 2015   Cardiac cath showed patent stents with distal LAD, circumflex-OM and RCA disease in small vessels.  . Anxiety   . CAD S/P percutaneous coronary angioplasty 2003, 04/2012   status post PCI to LAD, circumflex-OM 2, RCA  . Carotid artery occlusion   . Chronic renal insufficiency, stage II (mild)   . Chronic venous insufficiency    varicosities, no reflux; dopplers 04/14/12- valvular insufficiency in the R and L GSV  . Complication of anesthesia   . COPD, mild ([place])   . Depression    situaltional   . Diabetes mellitus   . Dyslipidemia associated with type 2 diabetes mellitus ([place])   . GERD (gastroesophageal reflux disease)   . HTN (hypertension)   . Hypothyroidism   . Neuromuscular disorder (HCC)    neuropathy in feet  . Neuropathy    notably improved following PCI with improved cardiac function  . Obesity   . 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 PONV (postoperative nausea and vomiting)    "[place]"  . Pulmonary hypertension ([place]) 04/2012   PA pressure 38mhg  . Sleep apnea    uses nightly    PAST SURGICAL HISTORY: Past Surgical History:  Procedure Laterality Date  . ABDOMINAL AORTAGRAM N/A 11/15/2011   Procedure: ABDOMINAL AMaxcine Ham  Surgeon: CElam Dutch MD;  [facility];  Service: Cardiovascular;  [name];  . ABIs  04/27/2012   mild bilateral arterial insufficiency  . BACK SURGERY  2005 x1   X2-2010  . BUNIONECTOMY Right 11/11/2013   Procedure: RIGHT FOOT SILVER  BUNIONECTOMY;  Surgeon: JWylene Simmer MD;  [facility] [facility]  Service: Orthopedics;  Laterality: Right;  . CARDIAC CATHETERIZATION  12/11/2001   significant 3V CAD, normal LV function  . Carotid Doppler  04/27/2012   right internal carotid: Elevated velocities but no evidence of plaque. Left internal carotid 40-59%  . CAROTID ENDARTERECTOMY  2005   Right; recent carotid Dopplers notes elevated velocities.   .Marland Kitchen  colonscopy    . CORONARY ANGIOPLASTY  12/21/2001   PTCA of the distal and mid AV groove circ, unsuccessful PTCA of second OM total occlusion, unsuccessful PTCA of the apical LAD total occlusion  . CORONARY ANGIOPLASTY WITH STENT PLACEMENT  04/29/12   PCI to 3 RCA lesions, Promus Premiere 2.66mx8mm distally, mid was 2.521m8mm and proximally 2.75x1634mEF 55-60%  . CORONARY STENT PLACEMENT  04/28/12   PCI to LAD (3x23m57mence DES postdilated to 3.25) and circ prox and mid (2 overlappinmg 2.5mmx82mmXience DES postdilated to 2.75mm)22mDOPPLER ECHOCARDIOGRAPHY  04/28/2012   poor quality study: EF estimated 60-65%; unable to assess diastolic function (previously noted to have diastolic dysfunction); severely dilated left atrium and mild right atrium; dilated IVC consistent with increased central venous pressure..  . HERNIA REPAIR    . LEFT AND RIGHT HEART CATHETERIZATION WITH CORONARY ANGIOGRAM N/A 04/27/2012   Procedure: LEFT AND RIGHT HEART CATHETERIZATION WITH CORONARY ANGIOGRAM;  Surgeon: David Leonie Man [facility];  Service: Cardiovascular;  [name];  . LEFT AND RIGHT HEART CATHETERIZATION WITH CORONARY ANGIOGRAM N/A 05/14/2013   Procedure: LEFT AND RIGHT HEART CATHETERIZATION WITH CORONARY ANGIOGRAM;  Surgeon: ThomasTroy Sine [facility] Moderate Pulm HTN: 46/16 - mean 33 mmHg; PCWP 22mmHg28[name], [name], Patent RCA ostial mid and distal  stents - 70% distal runoff Dz  . LUMBAR LAMINECTOMY/DECOMPRESSION MICRODISCECTOMY  01/07/2012   Procedure: LUMBAR LAMINECTOMY/DECOMPRESSION MICRODISCECTOMY 1 LEVEL;  Surgeon: Kyle L Winfield CunasLocation: [facility]UR[facility];  Service: Neurosurgery;  Laterality: Bilateral;   Lumbar Three-Four Decompression  . PERCUTANEOUS CORONARY STENT INTERVENTION (PCI-S) N/A 04/28/2012   Procedure: PERCUTANEOUS CORONARY STENT INTERVENTION (PCI-S);  Surgeon: Thomas Troy Sine[facility];  Service: Cardiovascular;  [name];  . PERCUTANEOUS CORONARY STENT INTERVENTION (PCI-S) N/A 04/29/2012   Procedure: PERCUTANEOUS CORONARY STENT INTERVENTION (PCI-S);  Surgeon: David WLeonie Man[facility];  Service: Cardiovascular;  [name];  . SPINE SURGERY    . UMBILICAL HERNIA REPAIR  2009   steel mesh insert    FAMILY HISTORY: Family History  Adopted: Yes  Family history unknown: Yes    SOCIAL HISTORY: Social History   Socioeconomic History  . Marital status: Single    Spouse name: Not on file  . Number of children: 0  . Years of education: hs  . Highest education level: Not on file  Occupational History  . Occupation: [employer]  Social Needs  . Financial resource strain: Not on file  . Food insecurity:    Worry: Not on file    Inability: Not on file  . Transportation needs:    Medical: Not on file    Non-medical: Not on file  Tobacco Use  . Smoking status: Former Smoker    Packs/day: 1.00    Years: 42.00    Pack years: 42.00    Types: Cigarettes    Last attempt to quit: 03/23/2003    Years since quitting: 15.3  . Smokeless tobacco: Never Used  Substance and Sexual Activity  . Alcohol use: No    Alcohol/week: 0.0 standard drinks  . Drug use: No  . Sexual activity: Not on file  Lifestyle  . Physical activity:    Days per week: Not on file    Minutes per  session: Not on file  . Stress: Not on file  Relationships   . Social connections:    Talks on phone: Not on file    Gets together: Not on file    Attends religious service: Not on file    Active member of club or organization: Not on file    Attends meetings of clubs or organizations: Not on file    Relationship status: Not on file  . Intimate partner violence:    Fear of current or ex partner: Not on file    Emotionally abused: Not on file    Physically abused: Not on file    Forced sexual activity: Not on file  Other Topics Concern  . Not on file  Social History Narrative   He and his partner [name] have now officially gotten married on 05/26/2013.  [name] is also a patient of our clinic.  He is now initially hyphenated his last name.   He works at [employer].   He does not smoke, he quit in 2009.  He does not drink alcohol.   He was adopted and no family history is known.       PHYSICAL EXAM  Vitals:   07/29/18 1306  BP: (!) 156/79  Pulse: 77  Temp: 97.8 F (36.6 C)  Weight: (!) 313 lb 12.8 oz (142.3 kg)  Height: _0  (1.854 m)   Body mass index is 41.4 kg/m.  Generalized: Well developed, in no acute distress  Chest: Lungs clear to auscultation  Neurological examination  Mentation: Alert oriented to time, place, history taking. Follows all commands speech and language fluent Cranial nerve II-XII: Pupils were equal round reactive to light. Extraocular movements were full, visual field were full on confrontational test. Facial sensation and strength were normal. Uvula tongue midline. Head turning and shoulder shrug  were normal and symmetric. Motor: The motor testing reveals 5 over 5 strength of all 4 extremities. Good symmetric motor tone is noted throughout.  Sensory: Sensory testing is intact to soft touch on all 4 extremities. No evidence of extinction is noted.  Coordination: Cerebellar testing reveals good finger-nose-finger and heel-to-shin bilaterally.  Gait and station: Gait is normal.    DIAGNOSTIC DATA  (LABS, IMAGING, TESTING) - I reviewed patient records, labs, notes, testing and imaging myself where available.  Lab Results  Component Value Date   WBC 5.6 05/12/2018   HGB 16.5 05/12/2018   HCT 47.1 05/12/2018   MCV 84 05/12/2018   PLT 170 05/12/2018      Component Value Date/Time   NA 141 05/12/2018 1150   K 3.7 05/12/2018 1150   CL 96 05/12/2018 1150   CO2 24 05/12/2018 1150   GLUCOSE 203 (H) 05/12/2018 1150   GLUCOSE 453 (H) 10/16/2015 1053   BUN 26 05/12/2018 1150   CREATININE 1.57 (H) 05/12/2018 1150   CREATININE 1.51 (H) 10/16/2015 1053   CALCIUM 9.5 05/12/2018 1150   PROT 6.4 05/12/2018 1150   ALBUMIN 4.2 05/12/2018 1150   AST 22 05/12/2018 1150   ALT 23 05/12/2018 1150   ALKPHOS 87 05/12/2018 1150   BILITOT 0.3 05/12/2018 1150   GFRNONAA 46 (L) 05/12/2018 1150   GFRNONAA 49 (L) 10/16/2015 1053   GFRAA 53 (L) 05/12/2018 1150   GFRAA 56 (L) 10/16/2015 1053   Lab Results  Component Value Date   CHOL 178 05/12/2018   HDL 42 05/12/2018   LDLCALC 67 05/12/2018   TRIG 343 (H) 05/12/2018   CHOLHDL 4.2 05/12/2018  Lab Results  Component Value Date   HGBA1C 7.1 (H) 02/20/2018   Lab Results  Component Value Date   VITAMINB12 261 06/22/2013   Lab Results  Component Value Date   TSH 1.910 02/20/2018      ASSESSMENT AND PLAN 63 y.o. year old male  has a past medical history of Anginal pain ([place]) (March 2015), Anxiety, CAD S/P percutaneous coronary angioplasty (2003, 04/2012), Carotid artery occlusion, Chronic renal insufficiency, stage II (mild), Chronic venous insufficiency, Complication of anesthesia, COPD, mild ([place]), Depression, Diabetes mellitus, Dyslipidemia associated with type 2 diabetes mellitus ([place]), GERD (gastroesophageal reflux disease), HTN (hypertension), Hypothyroidism, Neuromuscular disorder ([place]), Neuropathy, Obesity, Obesity, Class II, BMI 35.0-39.9, with comorbidity (see actual BMI), PONV (postoperative nausea and vomiting), Pulmonary  hypertension ([place]) (04/2012), and Sleep apnea. here with:  1.  Obstructive sleep apnea on CPAP  Overall the patient is doing well.  His download shows excellent compliance and good treatment of his apnea.  He is encouraged continue use of his CPAP nightly.  I will send an order over to [facility] asking that they take over as his DME.  Advised patient if his symptoms worsen or he develops new symptoms he should let us know.  He will follow-up in 6 months or sooner if needed.   I spent 15 minutes with the patient. 50% of this time was spent reviewing CPAP download.   Ward Givens, MSN, NP-C 07/29/2018, 1:33 PM [facility] [address] [place], [place] [address] [phone]

## 2018-07-29 NOTE — Patient Instructions (Signed)
Your Plan:  Continue CPAP  New order sent to [facility] If your symptoms worsen or you develop new symptoms please let us know.   Thank you for coming to see [place] at [facility]. I hope we have been able to provide you high quality care today.  You may receive a patient satisfaction survey over the next few weeks. We would appreciate your feedback and comments so that we may continue to improve ourselves and the health of our patients.

## 2018-07-29 NOTE — Progress Notes (Signed)
CMM sent to [facility] for TOC.

## 2018-08-05 ENCOUNTER — Telehealth: Payer: Self-pay

## 2018-08-05 NOTE — Telephone Encounter (Signed)
Paperwork has been faxed back to [facility] for cpap orders. Confirmation fax has been received.

## 2018-08-10 ENCOUNTER — Ambulatory Visit (HOSPITAL_COMMUNITY)
Admission: RE | Admit: 2018-08-10 | Discharge: 2018-08-10 | Disposition: A | Discharge status: Home / Self Care | Payer: No Typology Code available for payment source | Source: Ambulatory Visit | Attending: Gastroenterology | Admitting: Gastroenterology

## 2018-08-10 ENCOUNTER — Other Ambulatory Visit: Payer: Self-pay

## 2018-08-10 DIAGNOSIS — R1011 Right upper quadrant pain: Secondary | ICD-10-CM

## 2018-08-10 MED ORDER — TECHNETIUM TC 99M MEBROFENIN IV KIT
5.0000 | PACK | Freq: Once | INTRAVENOUS | Status: AC | PRN
  Administered 2018-08-10: 5 via INTRAVENOUS

## 2018-08-24 ENCOUNTER — Other Ambulatory Visit: Payer: Self-pay

## 2018-08-24 ENCOUNTER — Ambulatory Visit: Payer: Self-pay | Admitting: *Deleted

## 2018-08-24 VITALS — BP 132/74 | HR 64 | Wt 317.0 lb

## 2018-08-24 DIAGNOSIS — E118 Type 2 diabetes mellitus with unspecified complications: Secondary | ICD-10-CM

## 2018-08-24 DIAGNOSIS — Z794 Long term (current) use of insulin: Secondary | ICD-10-CM

## 2018-08-24 DIAGNOSIS — I10 Essential (primary) hypertension: Secondary | ICD-10-CM

## 2018-08-24 NOTE — Progress Notes (Signed)
Pt returned to work today after 3 months on paid leave 2/2 covid-19 restrictions in workplace. He reports stress and worsened eating habits since being out of work. CBGs out of control some during this time. 400-500 last month. Now averaging 200-300. Insulin changes per endocrinology that are supposed to be temporary. To bring updated med list to Friday 7/10 clinic appt.  Chart review reveals notes regarding Vascepa ordered by Cards for elevated lipids not covered by insurance, and med switched to [place]. Pt reports not knowing about med change and sts he will call pharmacy to see if Rx is still available.  Scheduled with counselor Dorisa for phone visits weekly to help him manage his stressors.  BP decent today, slightly out of normal range. Wt only 1# up from last clinic visit in April.  Still with R sided abd pain that has moved from R lateral thoracic region to RUQ. Has been evaluated by PCP & GI. GI ordered nuclear scan, which was negative. GI referred to gen surg. Appt tomorrow with Dr. Ninfa Linden at [facility].

## 2018-08-25 ENCOUNTER — Other Ambulatory Visit: Payer: Self-pay | Admitting: Surgery

## 2018-08-28 ENCOUNTER — Other Ambulatory Visit: Payer: Self-pay

## 2018-08-28 ENCOUNTER — Ambulatory Visit: Payer: Self-pay | Admitting: *Deleted

## 2018-08-28 VITALS — BP 140/74 | HR 76 | Wt 320.0 lb

## 2018-08-28 DIAGNOSIS — Z794 Long term (current) use of insulin: Secondary | ICD-10-CM

## 2018-08-28 DIAGNOSIS — E118 Type 2 diabetes mellitus with unspecified complications: Secondary | ICD-10-CM

## 2018-08-28 DIAGNOSIS — I10 Essential (primary) hypertension: Secondary | ICD-10-CM

## 2018-08-28 NOTE — Progress Notes (Signed)
Temporary stop of Novolog, Lantus. Changed to Humalog U-200 and Tresiba U-200 to try to get CBGs under control again.

## 2018-08-31 ENCOUNTER — Other Ambulatory Visit: Payer: Self-pay

## 2018-08-31 ENCOUNTER — Ambulatory Visit: Payer: Self-pay | Admitting: *Deleted

## 2018-08-31 VITALS — BP 155/83 | HR 71 | Wt 315.0 lb

## 2018-08-31 DIAGNOSIS — W19XXXA Unspecified fall, initial encounter: Secondary | ICD-10-CM

## 2018-08-31 DIAGNOSIS — E118 Type 2 diabetes mellitus with unspecified complications: Secondary | ICD-10-CM

## 2018-08-31 DIAGNOSIS — S59901A Unspecified injury of right elbow, initial encounter: Secondary | ICD-10-CM

## 2018-08-31 DIAGNOSIS — I10 Essential (primary) hypertension: Secondary | ICD-10-CM

## 2018-08-31 DIAGNOSIS — Y92009 Unspecified place in unspecified non-institutional (private) residence as the place of occurrence of the external cause: Secondary | ICD-10-CM

## 2018-08-31 NOTE — Progress Notes (Signed)
Pt fell out of bathtub over the weekend. Landed on his back. Did not hit head, no LOC. C/o R elbow pain. ROM almost fully intact. No bruising, warmth, erythema noted. Slight, very mild swelling over elbow. Pt reports worst pain is when he tries to grip and pick up something with R hand. Wrapped R elbow in ace bandage for compression and support. Provided tylenol 54m q4-6 prn pain from clinic stock. Pt denies further questions, concerns.

## 2018-09-02 DIAGNOSIS — M65312 Trigger thumb, left thumb: Secondary | ICD-10-CM | POA: Insufficient documentation

## 2018-09-02 DIAGNOSIS — S5001XA Contusion of right elbow, initial encounter: Secondary | ICD-10-CM | POA: Insufficient documentation

## 2018-09-04 ENCOUNTER — Ambulatory Visit: Payer: Self-pay | Admitting: *Deleted

## 2018-09-04 VITALS — BP 156/75 | HR 63 | Wt 320.0 lb

## 2018-09-04 DIAGNOSIS — I10 Essential (primary) hypertension: Secondary | ICD-10-CM

## 2018-09-04 DIAGNOSIS — E118 Type 2 diabetes mellitus with unspecified complications: Secondary | ICD-10-CM

## 2018-09-04 NOTE — Progress Notes (Signed)
Pt reports endocrinology feels that he is taking so much insulin, that his body is not utilizing it as well. Wants to remove Humalog, Novolog and [place] and switch him to Humalin extra strength. Waiting for insurance approval.

## 2018-09-07 ENCOUNTER — Other Ambulatory Visit: Payer: Self-pay

## 2018-09-07 ENCOUNTER — Ambulatory Visit: Payer: Self-pay | Admitting: *Deleted

## 2018-09-07 VITALS — BP 140/81 | HR 67 | Wt 321.0 lb

## 2018-09-07 DIAGNOSIS — Z794 Long term (current) use of insulin: Secondary | ICD-10-CM

## 2018-09-07 DIAGNOSIS — I10 Essential (primary) hypertension: Secondary | ICD-10-CM

## 2018-09-07 DIAGNOSIS — E118 Type 2 diabetes mellitus with unspecified complications: Secondary | ICD-10-CM

## 2018-09-07 NOTE — Progress Notes (Signed)
CBG improved today. Was in mid 88's last night but in 110's and 120's today.   R elbow pain improved. Was evaluated by ortho and had elbow sleeve placed.   Having trigger finger surgery on L thumb on 09/17/18.

## 2018-09-11 ENCOUNTER — Ambulatory Visit: Payer: Self-pay | Admitting: *Deleted

## 2018-09-11 ENCOUNTER — Other Ambulatory Visit: Payer: Self-pay

## 2018-09-11 VITALS — BP 166/82 | HR 68 | Wt 324.0 lb

## 2018-09-11 DIAGNOSIS — E118 Type 2 diabetes mellitus with unspecified complications: Secondary | ICD-10-CM

## 2018-09-11 DIAGNOSIS — I10 Essential (primary) hypertension: Secondary | ICD-10-CM

## 2018-09-11 NOTE — Progress Notes (Signed)
CBGs improving on new meds. Past 24 hours all readings under 200. A couple under 38. Plans to start new meds tonight.  4# increase in wt since Monday. He sts his diet had been poor this week, eating too many carbs and feels bloated. "know what I need to do to fix it and will work on it this weekend."

## 2018-09-14 ENCOUNTER — Ambulatory Visit: Payer: Self-pay | Admitting: *Deleted

## 2018-09-14 ENCOUNTER — Other Ambulatory Visit: Payer: Self-pay

## 2018-09-14 VITALS — BP 136/73 | HR 64 | Wt 322.0 lb

## 2018-09-14 DIAGNOSIS — I10 Essential (primary) hypertension: Secondary | ICD-10-CM

## 2018-09-14 DIAGNOSIS — E118 Type 2 diabetes mellitus with unspecified complications: Secondary | ICD-10-CM

## 2018-09-14 DIAGNOSIS — Z794 Long term (current) use of insulin: Secondary | ICD-10-CM

## 2018-09-14 NOTE — Progress Notes (Signed)
Started new Humulin-R U-500 insulin 65units TID 2 days ago. Sugars were low, <90, then 100-200. Since day of starting Humulin-R, sugars have been 250-300. Plans to speak with endocrinology on Thursday (3 days from now) after seeing how the next few days go. No sx r/t elevated sugars.   Trigger finger surgery on L thumb Thursday 09/17/18.

## 2018-09-18 ENCOUNTER — Other Ambulatory Visit: Payer: Self-pay

## 2018-09-18 ENCOUNTER — Ambulatory Visit: Payer: Self-pay | Admitting: *Deleted

## 2018-09-18 VITALS — BP 148/78 | HR 61 | Wt 318.0 lb

## 2018-09-18 DIAGNOSIS — I10 Essential (primary) hypertension: Secondary | ICD-10-CM

## 2018-09-18 DIAGNOSIS — E118 Type 2 diabetes mellitus with unspecified complications: Secondary | ICD-10-CM

## 2018-09-18 DIAGNOSIS — Z794 Long term (current) use of insulin: Secondary | ICD-10-CM

## 2018-09-21 ENCOUNTER — Other Ambulatory Visit: Payer: Self-pay

## 2018-09-21 ENCOUNTER — Ambulatory Visit: Payer: Self-pay | Admitting: *Deleted

## 2018-09-21 VITALS — BP 151/88 | HR 71 | Temp 97.1°F | Wt 317.0 lb

## 2018-09-21 DIAGNOSIS — I10 Essential (primary) hypertension: Secondary | ICD-10-CM

## 2018-09-21 DIAGNOSIS — E118 Type 2 diabetes mellitus with unspecified complications: Secondary | ICD-10-CM

## 2018-09-25 ENCOUNTER — Other Ambulatory Visit: Payer: Self-pay

## 2018-09-25 ENCOUNTER — Ambulatory Visit: Payer: Self-pay | Admitting: *Deleted

## 2018-09-25 VITALS — BP 168/82 | HR 64 | Wt 319.0 lb

## 2018-09-25 DIAGNOSIS — E118 Type 2 diabetes mellitus with unspecified complications: Secondary | ICD-10-CM

## 2018-09-25 DIAGNOSIS — I10 Essential (primary) hypertension: Secondary | ICD-10-CM

## 2018-09-25 DIAGNOSIS — Z794 Long term (current) use of insulin: Secondary | ICD-10-CM

## 2018-09-28 ENCOUNTER — Other Ambulatory Visit: Payer: Self-pay

## 2018-09-28 ENCOUNTER — Ambulatory Visit: Payer: Self-pay | Admitting: *Deleted

## 2018-09-28 VITALS — BP 145/73 | HR 59 | Wt 317.0 lb

## 2018-09-28 DIAGNOSIS — E118 Type 2 diabetes mellitus with unspecified complications: Secondary | ICD-10-CM

## 2018-09-28 DIAGNOSIS — I10 Essential (primary) hypertension: Secondary | ICD-10-CM

## 2018-09-28 DIAGNOSIS — Z794 Long term (current) use of insulin: Secondary | ICD-10-CM

## 2018-10-06 ENCOUNTER — Ambulatory Visit: Payer: Self-pay | Admitting: *Deleted

## 2018-10-06 ENCOUNTER — Other Ambulatory Visit: Payer: Self-pay | Admitting: Rheumatology

## 2018-10-06 ENCOUNTER — Other Ambulatory Visit: Payer: Self-pay

## 2018-10-06 VITALS — BP 143/78 | HR 64 | Wt 319.0 lb

## 2018-10-06 DIAGNOSIS — E118 Type 2 diabetes mellitus with unspecified complications: Secondary | ICD-10-CM

## 2018-10-06 DIAGNOSIS — Z794 Long term (current) use of insulin: Secondary | ICD-10-CM

## 2018-10-06 DIAGNOSIS — I10 Essential (primary) hypertension: Secondary | ICD-10-CM

## 2018-10-06 DIAGNOSIS — M25522 Pain in left elbow: Secondary | ICD-10-CM

## 2018-10-06 NOTE — Progress Notes (Signed)
Endocrinology appt this afternoon to discuss variable cbg readings. Still averaging 300-400's readings. Pt denies sx related to elevated sugars.   Scheduled to see nephrology this Friday.   Sutures removed yesterday from L thumb trigger finger surgery. Surgeon happy with results. Site is dry, flaky. Wound fully closed. Gave pt a few packets of Aquaphor ointment to apply 2-3x/day to site to help dry skin.

## 2018-10-09 ENCOUNTER — Ambulatory Visit: Payer: Self-pay | Admitting: *Deleted

## 2018-10-09 ENCOUNTER — Other Ambulatory Visit: Payer: Self-pay

## 2018-10-09 VITALS — BP 160/81 | HR 66 | Wt 320.0 lb

## 2018-10-09 DIAGNOSIS — E118 Type 2 diabetes mellitus with unspecified complications: Secondary | ICD-10-CM

## 2018-10-09 DIAGNOSIS — I10 Essential (primary) hypertension: Secondary | ICD-10-CM

## 2018-10-09 DIAGNOSIS — Z794 Long term (current) use of insulin: Secondary | ICD-10-CM

## 2018-10-12 ENCOUNTER — Other Ambulatory Visit: Payer: Self-pay | Admitting: Rheumatology

## 2018-10-12 DIAGNOSIS — M25521 Pain in right elbow: Secondary | ICD-10-CM

## 2018-10-14 ENCOUNTER — Ambulatory Visit: Payer: PRIVATE HEALTH INSURANCE | Admitting: Neurology

## 2018-10-19 ENCOUNTER — Other Ambulatory Visit: Payer: Self-pay

## 2018-10-19 ENCOUNTER — Ambulatory Visit: Payer: Self-pay | Admitting: *Deleted

## 2018-10-19 VITALS — BP 150/77 | HR 66 | Wt 320.0 lb

## 2018-10-19 DIAGNOSIS — E118 Type 2 diabetes mellitus with unspecified complications: Secondary | ICD-10-CM

## 2018-10-19 DIAGNOSIS — Z794 Long term (current) use of insulin: Secondary | ICD-10-CM

## 2018-10-19 DIAGNOSIS — I10 Essential (primary) hypertension: Secondary | ICD-10-CM

## 2018-10-19 NOTE — Progress Notes (Signed)
Humulin R increased to 100 units TID on 10/15/2018. Endocrinology also confirmed when pt was taking long acting insulin and determined he was taking insulin when eating instead of at least 38mn prior. He has changed his routine to accommodate earlier long acting dosing.  Started using My Fitness Pal app to track calories and food diary at the endocrinologists' request. Reviewed this. Pt recorded 2 servings of Jimmy Dean sausage breakfast bowl and (4) 20.0oz servings of Diet Mt Dew as he typically drinks about a 2 liter a day. Sts he also has 5 bottles of water throughout most days. He has daily calorie goal set as 2620 calories which is "just something I came up with on a whim." Advised pt if endocrinology is most concerned about carb intake, to seriously consider app Premium purchase to track this unless he wants to manually record and track separately. Pt sts he is waiting until his next endo appt to see how things have changed and will think about it then.   Pt also reports he has "cut out his snacking", mentions specifically potato chips, but stated endo approved him to have peanuts "as much as he wants". Reminded pt of sodium content and fluid retention. He sts they are "lightly salted" and he has approx 1 cup per day on average per pt.

## 2018-10-23 ENCOUNTER — Other Ambulatory Visit: Payer: Self-pay

## 2018-10-23 ENCOUNTER — Ambulatory Visit: Payer: Self-pay | Admitting: *Deleted

## 2018-10-23 VITALS — BP 151/76 | HR 66 | Wt 323.0 lb

## 2018-10-23 DIAGNOSIS — I6523 Occlusion and stenosis of bilateral carotid arteries: Secondary | ICD-10-CM

## 2018-10-23 DIAGNOSIS — I739 Peripheral vascular disease, unspecified: Secondary | ICD-10-CM

## 2018-10-23 DIAGNOSIS — I10 Essential (primary) hypertension: Secondary | ICD-10-CM

## 2018-10-23 DIAGNOSIS — E118 Type 2 diabetes mellitus with unspecified complications: Secondary | ICD-10-CM

## 2018-10-28 ENCOUNTER — Ambulatory Visit (INDEPENDENT_AMBULATORY_CARE_PROVIDER_SITE_OTHER)
Admission: RE | Admit: 2018-10-28 | Discharge: 2018-10-28 | Disposition: A | Discharge status: Home / Self Care | Payer: No Typology Code available for payment source | Source: Ambulatory Visit | Attending: Family | Admitting: Family

## 2018-10-28 ENCOUNTER — Ambulatory Visit (HOSPITAL_COMMUNITY)
Admission: RE | Admit: 2018-10-28 | Discharge: 2018-10-28 | Disposition: A | Discharge status: Home / Self Care | Payer: No Typology Code available for payment source | Source: Ambulatory Visit | Attending: Family | Admitting: Family

## 2018-10-28 ENCOUNTER — Ambulatory Visit (INDEPENDENT_AMBULATORY_CARE_PROVIDER_SITE_OTHER): Payer: No Typology Code available for payment source | Admitting: Family

## 2018-10-28 ENCOUNTER — Other Ambulatory Visit: Payer: Self-pay

## 2018-10-28 ENCOUNTER — Encounter: Payer: Self-pay | Admitting: Family

## 2018-10-28 VITALS — BP 147/80 | HR 64 | Temp 97.4°F | Resp 20 | Ht 73.0 in | Wt 321.5 lb

## 2018-10-28 DIAGNOSIS — I739 Peripheral vascular disease, unspecified: Secondary | ICD-10-CM | POA: Insufficient documentation

## 2018-10-28 DIAGNOSIS — I6523 Occlusion and stenosis of bilateral carotid arteries: Secondary | ICD-10-CM | POA: Insufficient documentation

## 2018-10-28 DIAGNOSIS — Z87891 Personal history of nicotine dependence: Secondary | ICD-10-CM

## 2018-10-28 DIAGNOSIS — Z9889 Other specified postprocedural states: Secondary | ICD-10-CM | POA: Diagnosis not present

## 2018-10-28 NOTE — Patient Instructions (Signed)

## 2018-10-28 NOTE — Progress Notes (Signed)
Chief Complaint: Follow up Extracranial Carotid Artery Stenosis and mild peripheral artery occlusive disease    History of Present Illness  Harold [name] is a 63 y.o. male who presented for evaluation by Dr. Oneida Alar in Sept., 2013 of a moderate carotid stenosis as well as bilateral lower extremity pain. The patient previously had a right carotid endarterectomy in 2005 by Dr. Oneida Alar.  On 11/15/11 he underwent an aortogram with bilateral run-off by Dr. Oneida Alar, findings were mild diffuse tibial disease with no significant aortoiliac superficial femoral popliteal disease, no intervention necessary. He was counseled by Dr. Oneida Alar at that time re management of his DM and weight loss.  He had 6 cardiac stents placed by Dr. Ellyn Hack in March, 2014, he denies having an MI. Dr. Ellyn Hack is his cardiologist.  He develops pain and weakness in both calves after walking about 100 feet, which resolves; this has been gradually worsening since 2016.  He has had 4 lumbar spine surgeries, Dr. Marylyn Ishihara, [facility]. Pt has low back pain that radiates to below both knees when he walks, states his walking is limited by his back pain.  He denies non-healing wounds.  He denies history of stroke or TIA symptoms. Uses his C-PAP nightly for OSA. He had a right trigger finger surgery and right foot bunionectomy in 2015.  Diabetic: Yes, most recent A1C was 7.1 on 02-20-18, improving from 11.0, is working closely with Dr. Shelia Media to improve this. Pt states he was told by his PCP that he is extremely insulin resistant.  Tobacco use: former smoker, quit in 2005  Pt meds include: Statin :yes Betablocker: Yes ASA: yes Other anticoagulants/antiplatelets: Brilinta, then Xarelto for CAD   Past Medical History:  Diagnosis Date  . Anginal pain [facility]) March 2015   Cardiac cath showed patent stents with distal LAD, circumflex-OM and RCA disease in small vessels.  . Anxiety   . CAD S/P percutaneous coronary angioplasty  2003, 04/2012   status post PCI to LAD, circumflex-OM 2, RCA  . Carotid artery occlusion   . Chronic renal insufficiency, stage II (mild)   . Chronic venous insufficiency    varicosities, no reflux; dopplers 04/14/12- valvular insufficiency in the R and L GSV  . Complication of anesthesia   . COPD, mild ([place])   . Depression    situaltional   . Diabetes mellitus   . Dyslipidemia associated with type 2 diabetes mellitus ([place])   . GERD (gastroesophageal reflux disease)   . HTN (hypertension)   . Hypothyroidism   . Neuromuscular disorder (HCC)    neuropathy in feet  . Neuropathy    notably improved following PCI with improved cardiac function  . Obesity   . 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 PONV (postoperative nausea and vomiting)    "[place]"  . Pulmonary hypertension ([place]) 04/2012   PA pressure 4mhg  . Sleep apnea    uses nightly    Social History Social History   Tobacco Use  . Smoking status: Former Smoker    Packs/day: 1.00    Years: 42.00    Pack years: 42.00    Types: Cigarettes    Quit date: 03/23/2003    Years since quitting: 15.6  . Smokeless tobacco: Never Used  Substance Use Topics  . Alcohol use: No    Alcohol/week: 0.0 standard drinks  . Drug use: No    Family History Family  History  Adopted: Yes  Family history unknown: Yes    Surgical History Past Surgical History:  Procedure Laterality Date  . ABDOMINAL AORTAGRAM N/A 11/15/2011   Procedure: ABDOMINAL Maxcine Ham;  Surgeon: Elam Dutch, MD;  Location: [facility] CATH LAB;  Service: Cardiovascular;  [name];  . ABIs  04/27/2012   mild bilateral arterial insufficiency  . BACK SURGERY  2005 x1   X2-2010  . BUNIONECTOMY Right 11/11/2013   Procedure: RIGHT FOOT SILVER BUNIONECTOMY;  Surgeon: Wylene Simmer, MD;  Location: [place];  Service: Orthopedics;  Laterality: Right;  . CARDIAC  CATHETERIZATION  12/11/2001   significant 3V CAD, normal LV function  . Carotid Doppler  04/27/2012   right internal carotid: Elevated velocities but no evidence of plaque. Left internal carotid 40-59%  . CAROTID ENDARTERECTOMY  2005   Right; recent carotid Dopplers notes elevated velocities.   . colonscopy    . CORONARY ANGIOPLASTY  12/21/2001   PTCA of the distal and mid AV groove circ, unsuccessful PTCA of second OM total occlusion, unsuccessful PTCA of the apical LAD total occlusion  . CORONARY ANGIOPLASTY WITH STENT PLACEMENT  04/29/12   PCI to 3 RCA lesions, Promus Premiere 2.30mx8mm distally, mid was 2.566m8mm and proximally 2.75x1641mEF 55-60%  . CORONARY STENT PLACEMENT  04/28/12   PCI to LAD (3x23m41mence DES postdilated to 3.25) and circ prox and mid (2 overlappinmg 2.5mmx66mmXience DES postdilated to 2.75mm)20mDOPPLER ECHOCARDIOGRAPHY  04/28/2012   poor quality study: EF estimated 60-65%; unable to assess diastolic function (previously noted to have diastolic dysfunction); severely dilated left atrium and mild right atrium; dilated IVC consistent with increased central venous pressure..  . HERNIA REPAIR    . LEFT AND RIGHT HEART CATHETERIZATION WITH CORONARY ANGIOGRAM N/A 04/27/2012   Procedure: LEFT AND RIGHT HEART CATHETERIZATION WITH CORONARY ANGIOGRAM;  Surgeon: David Leonie Man [facility] [place];  Service: Cardiovascular;  [name];  . LEFT AND RIGHT HEART CATHETERIZATION WITH CORONARY ANGIOGRAM N/A 05/14/2013   Procedure: LEFT AND RIGHT HEART CATHETERIZATION WITH CORONARY ANGIOGRAM;  Surgeon: ThomasTroy Sine [facility]: Moderate Pulm HTN: 46/16 - mean 33 mmHg; PCWP 22mmHg28[name], [name], Patent RCA ostial mid and distal stents - 70% distal runoff Dz  . LUMBAR LAMINECTOMY/DECOMPRESSION MICRODISCECTOMY  01/07/2012   Procedure: LUMBAR  LAMINECTOMY/DECOMPRESSION MICRODISCECTOMY 1 LEVEL;  Surgeon: Kyle L Winfield [facility]asLocation: [facility];  Service: Neurosurgery;  Laterality: Bilateral;   Lumbar Three-Four Decompression  . PERCUTANEOUS CORONARY STENT INTERVENTION (PCI-S) N/A 04/28/2012   Procedure: PERCUTANEOUS CORONARY STENT INTERVENTION (PCI-S);  Surgeon: Thomas Troy [facility]ine[facility];  Service: Cardiovascular;  [name];  . PERCUTANEOUS CORONARY STENT INTERVENTION (PCI-S) N/A 04/29/2012   Procedure: PERCUTANEOUS CORONARY STENT INTERVENTION (PCI-S);  Surgeon: David WLeonie Man[facility];  Service: Cardiovascular;  [name];  . SPINE SURGERY    . UMBILICAL HERNIA REPAIR  2009   steel mesh insert    Allergies  Allergen Reactions  . Septra [Bactrim] Itching  . Penicillins Hives    Allergy to All cillin drugs  Ancef given 9/24 with no obvious reaction  . Clopidogrel     NON RESPONDER  P2Y12  . Metformin And Related   . Other     Walnuts, pecans     Current Outpatient Medications  Medication Sig Dispense Refill  . allopurinol (ZYLOPRIM) 100 MG tablet Take 100 mg by mouth daily.  1  . aspirin EC 81 MG tablet Take 1 tablet (81 mg total) by mouth daily. 90 tablet 3  . canagliflozin (INVOKANA) 100 MG TABS tablet 3 (three) times daily before meals.     . carvedilol (COREG) 6.25 MG tablet Take 1 tablet (6.25 mg total) by mouth 2 (two) times daily with a meal. 180 tablet 2  . COLCRYS 0.6 MG tablet Take 0.6 mg by mouth daily.    . Continuous Blood Gluc Receiver (FREESTYLE LIBRE 14 DAY READER) DEVI FreeStyle Libre 14 Day Reader  USE AS DIRECTED    . Continuous Blood Gluc Sensor (FREESTYLE LIBRE 14 DAY SENSOR) MISC APPLY 1 SENSOR EVERY 14 DAYS  11  . empagliflozin (JARDIANCE) 25 MG TABS tablet Jardiance 25 mg tablet  TAKE 1 TABLET BY MOUTH EVERY DAY    . escitalopram (LEXAPRO) 20 MG tablet TAKE 1 TABLET (20 MG TOTAL) BY MOUTH DAILY. 30 tablet 1  . furosemide (LASIX) 80 MG tablet Take  160 mg by mouth 3 (three) times daily.  10  . Insulin Degludec (TRESIBA FLEXTOUCH) 200 UNIT/ML SOPN Inject 90 Units into the skin 2 (two) times a day.    . insulin regular human CONCENTRATED (HUMULIN R U-500 KWIKPEN) 500 UNIT/ML kwikpen Inject 65 Units into the skin 3 (three) times daily with meals.    Marland Kitchen levothyroxine (SYNTHROID, LEVOTHROID) 88 MCG tablet Take 88 mcg by mouth daily before breakfast.    . nitroGLYCERIN (NITROSTAT) 0.4 MG SL tablet PLACE 1 TAB UNDER THE TONGUE EVERY 5 MINUTES AS NEEDED FOR CHEST PAIN (SEVERE PRESSURE OR TIGHTNESS) 25 tablet 3  . omeprazole (PRILOSEC) 20 MG capsule Take 20 mg by mouth daily.    . potassium chloride (MICRO-K) 10 MEQ CR capsule Take by mouth daily.  5  . rosuvastatin (CRESTOR) 20 MG tablet Take 20 mg by mouth daily.    . TRULICITY 1.5 CB/7.6EG SOPN Inject 1.5 mg into the skin once a week.  5  . XARELTO 2.5 MG TABS tablet TAKE 1 TABLET BY MOUTH TWICE A DAY 180 tablet 3  . losartan (COZAAR) 25 MG tablet Take 1 tablet (25 mg total) by mouth daily. 90 tablet 3   No current facility-administered medications for this visit.     Review of Systems : See HPI for pertinent positives and negatives.  Physical Examination  Vitals:   10/28/18 1144 10/28/18 1148  BP: 140/78 (!) 147/80  Pulse: 64   Resp: 20   Temp: (!) 97.4 F (36.3 C)   SpO2: 97%   Weight: (!) 321 lb 8 oz (145.8 kg)   Height: _0  (1.854 m)    Body mass index is 42.42 kg/m.  General: WDWN morbidly obese male in NAD GAIT: normal Eyes: PERRLA HENT: No gross abnormalities. Large neck Pulmonary:  Respirations are non-labored, good air movement in all fields, CTAB, no rales, rhonchi, or  wheezes Cardiac: regular rhythm, no detected murmur. Heart sounds are distant.   VASCULAR EXAM Carotid Bruits Right Left   Negative Negative     Abdominal aortic pulse is not palpable. Radial pulses are 2+ palpable and equal.  LE Pulses Right Left       FEMORAL  not palpable, seated, morbidly obese  not palpable        POPLITEAL  not palpable   not palpable       POSTERIOR TIBIAL  not palpable   2+ palpable        DORSALIS PEDIS      ANTERIOR TIBIAL not palpable  not palpable     Gastrointestinal: soft, nontender, BS WNL, no r/g, no palpable masses. Musculoskeletal: no muscle atrophy/wasting. M/S 5/5 throughout, extremities without ischemic changes Skin: No rashes, no ulcers, no cellulitis. Few varicosities in both lower legs, no edema.   Neurologic:  A&O X 3; appropriate affect, sensation is normal; speech is normal, CN 2-12 intact, pain and light touch intact in extremities, motor exam as listed above. Psychiatric: Normal thought content, mood appropriate to clinical situation.    DATA  Carotid Duplex (10-28-18): Right Carotid: Patent right carotid endarterectomy site with Doppler velocities in the right ICA consistent with a 1-39% stenosis. Left Carotid: Velocities in the left ICA are consistent with a 60-79% stenosis. Vertebrals:  Bilateral vertebral arteries demonstrate antegrade flow. Subclavians: Normal flow hemodynamics were seen in bilateral subclavian arteries. Stable in the right ICA, increased stenosis in the left ICA compared to the exam on 03-14-16.    ABI Findings (10-28-18): +---------+------------------+-----+--------+--------+ Right    Rt Pressure (mmHg)IndexWaveformComment  +---------+------------------+-----+--------+--------+ Brachial 126                                     +---------+------------------+-----+--------+--------+ PTA      111               0.88 biphasic         +---------+------------------+-----+--------+--------+ DP       104               0.83 biphasic         +---------+------------------+-----+--------+--------+ Great Toe52                0.41 Abnormal          +---------+------------------+-----+--------+--------+  +---------+------------------+-----+--------+-------+ Left     Lt Pressure (mmHg)IndexWaveformComment +---------+------------------+-----+--------+-------+ Brachial 125                                    +---------+------------------+-----+--------+-------+ PTA      127               1.01 biphasic        +---------+------------------+-----+--------+-------+ DP       118               0.94 biphasic        +---------+------------------+-----+--------+-------+ Great Toe56                0.44 Abnormal        +---------+------------------+-----+--------+-------+  +-------+-----------+-----------+------------+------------+ ABI/TBIToday's ABIToday's TBIPrevious ABIPrevious TBI +-------+-----------+-----------+------------+------------+ Right  0.88       0.41       0.96        .69          +-------+-----------+-----------+------------+------------+ Left   1.01       0.44       1.08        .69          +-------+-----------+-----------+------------+------------+ Summary: Right: Resting right ankle-brachial index indicates mild  right lower extremity arterial disease. The right toe-brachial index is abnormal. Left: Resting left ankle-brachial index is within normal range. No evidence of significant left lower extremity arterial disease. The left toe-brachial index is normal.    Assessment: Harold [name] is a 63 y.o. male who is s/p right CEA in 2005. He has no hx of stroke or TIA. Carotid duplex today shows 1-39% stenosis in the right IC and 60-79% stenosis in the left ICA. Stable in the right ICA, increased stenosis in the left ICA compared to the exam on 03-14-16.    He has developed pain in both calves after walking about 100 feet, resolves with rest. There are no signs of ischemia in his feet/legs and left PT pulse is 2+ palpable. ABI's today show mild disease in the right LE  and no disease in the left LE, all biphasic waveforms. Slight decline in right ABI, stable in the left compared to the exam on 02-22-13, that exam had triphasic waveforms throughout.   The pain in his calves with walking is not due to lack of arterial perfusion.  2013 aortogram showed  mild diffuse tibial disease with no significant aortoiliac superficial femoral popliteal disease.  He has known significant lumbar spine issues and DM neuropathy which both could be contributing factors to bilateral calf pain with walking.   His atherosclerotic risk factors include uncontrolled but improving DM, morbid obesity which is also improving, CAD, OSA, and former smoker.    His walking is also limited by mild dyspnea, he denies chest pain.  He is losing weight on Trulicity.     Plan: Follow-up in 6 months with Carotid Duplex scan. ABI's in 1-2 years.   Daily seated leg exercises demonstrated and discussed.     I discussed in depth with the patient the nature of atherosclerosis, and emphasized the importance of maximal medical management including strict control of blood pressure, blood glucose, and lipid levels, obtaining regular exercise, and continued cessation of smoking.  The patient is aware that without maximal medical management the underlying atherosclerotic disease process will progress, limiting the benefit of any interventions. The patient was given information about stroke prevention and what symptoms should prompt the patient to seek immediate medical care. Thank you for allowing [place] to participate in this patient's care.  Clemon Chambers, RN, MSN, FNP-C [facility] Office: [phone]  Clinic Physician: Oneida Alar  10/28/18 12:07 PM

## 2018-10-31 NOTE — Progress Notes (Signed)
Please forward this to his new PCP.  I retired October 2017

## 2018-11-02 ENCOUNTER — Other Ambulatory Visit: Payer: Self-pay

## 2018-11-02 ENCOUNTER — Ambulatory Visit: Payer: Self-pay | Admitting: *Deleted

## 2018-11-02 VITALS — BP 142/72 | Wt 323.0 lb

## 2018-11-02 DIAGNOSIS — I10 Essential (primary) hypertension: Secondary | ICD-10-CM

## 2018-11-02 DIAGNOSIS — E118 Type 2 diabetes mellitus with unspecified complications: Secondary | ICD-10-CM

## 2018-11-02 DIAGNOSIS — Z794 Long term (current) use of insulin: Secondary | ICD-10-CM

## 2018-11-02 NOTE — Progress Notes (Signed)
Pt saw endocrinology on 10/29/18. Humulin-R was adjusted from 100 units TID to 110 units in AM, 90-100 based on cbg at lunch, and 90 units at dinner to try to avoid the overnight/early morning low cbgs.  Pt inquiring about back braces and referral back to original surgeon. Advised him to contact that surgeon's office to see what they need to get him set up as a pt again. Also discuss with surgeon regarding back brace due to pt's extensive back surgery and pain Hx. Pt verbalizes understanding and agreement.

## 2018-11-04 ENCOUNTER — Other Ambulatory Visit: Payer: Self-pay | Admitting: Rheumatology

## 2018-11-04 DIAGNOSIS — M25522 Pain in left elbow: Secondary | ICD-10-CM

## 2018-11-06 ENCOUNTER — Other Ambulatory Visit: Payer: Self-pay

## 2018-11-06 ENCOUNTER — Ambulatory Visit: Payer: Self-pay | Admitting: *Deleted

## 2018-11-06 VITALS — BP 151/77 | HR 70 | Wt 326.0 lb

## 2018-11-06 DIAGNOSIS — E118 Type 2 diabetes mellitus with unspecified complications: Secondary | ICD-10-CM

## 2018-11-06 DIAGNOSIS — I10 Essential (primary) hypertension: Secondary | ICD-10-CM

## 2018-11-07 ENCOUNTER — Inpatient Hospital Stay: Admission: RE | Admit: 2018-11-07 | Payer: No Typology Code available for payment source | Source: Ambulatory Visit

## 2018-11-09 ENCOUNTER — Other Ambulatory Visit: Payer: Self-pay

## 2018-11-09 ENCOUNTER — Ambulatory Visit: Payer: Self-pay | Admitting: *Deleted

## 2018-11-09 VITALS — BP 143/76 | HR 62 | Wt 323.0 lb

## 2018-11-09 DIAGNOSIS — Z794 Long term (current) use of insulin: Secondary | ICD-10-CM

## 2018-11-09 DIAGNOSIS — E118 Type 2 diabetes mellitus with unspecified complications: Secondary | ICD-10-CM

## 2018-11-09 DIAGNOSIS — I10 Essential (primary) hypertension: Secondary | ICD-10-CM

## 2018-12-01 ENCOUNTER — Ambulatory Visit: Payer: PRIVATE HEALTH INSURANCE | Admitting: Neurology

## 2019-01-28 ENCOUNTER — Ambulatory Visit: Payer: PRIVATE HEALTH INSURANCE | Admitting: Adult Health

## 2019-02-22 ENCOUNTER — Telehealth: Payer: Self-pay | Admitting: Cardiology

## 2019-02-22 ENCOUNTER — Other Ambulatory Visit: Payer: Self-pay | Admitting: Cardiology

## 2019-02-22 DIAGNOSIS — L84 Corns and callosities: Secondary | ICD-10-CM | POA: Diagnosis not present

## 2019-02-22 DIAGNOSIS — B351 Tinea unguium: Secondary | ICD-10-CM | POA: Diagnosis not present

## 2019-02-22 DIAGNOSIS — L602 Onychogryphosis: Secondary | ICD-10-CM | POA: Diagnosis not present

## 2019-02-22 DIAGNOSIS — M21612 Bunion of left foot: Secondary | ICD-10-CM | POA: Diagnosis not present

## 2019-02-22 DIAGNOSIS — E1351 Other specified diabetes mellitus with diabetic peripheral angiopathy without gangrene: Secondary | ICD-10-CM | POA: Diagnosis not present

## 2019-02-22 NOTE — Telephone Encounter (Signed)
Patient has switched to medicare and they do not cover XARELTO 2.5 MG TABS tablet. He will need a new prescription written for a different medication.

## 2019-02-23 DIAGNOSIS — E1140 Type 2 diabetes mellitus with diabetic neuropathy, unspecified: Secondary | ICD-10-CM | POA: Diagnosis not present

## 2019-02-23 DIAGNOSIS — I10 Essential (primary) hypertension: Secondary | ICD-10-CM | POA: Diagnosis not present

## 2019-02-23 DIAGNOSIS — E039 Hypothyroidism, unspecified: Secondary | ICD-10-CM | POA: Diagnosis not present

## 2019-02-23 DIAGNOSIS — E785 Hyperlipidemia, unspecified: Secondary | ICD-10-CM | POA: Diagnosis not present

## 2019-02-23 NOTE — Telephone Encounter (Signed)
Left message  For patient to find out from the  Medicare formulary what he can take - and  Contact office back by phone or mychart  Also he needs a appointment schedule for jan 2021

## 2019-02-24 ENCOUNTER — Other Ambulatory Visit: Payer: Self-pay

## 2019-02-24 MED ORDER — OMEPRAZOLE 20 MG PO CPDR: 20.0000 mg | DELAYED_RELEASE_CAPSULE | Freq: Every day | ORAL | 1 refills | Status: DC

## 2019-03-02 DIAGNOSIS — R809 Proteinuria, unspecified: Secondary | ICD-10-CM | POA: Diagnosis not present

## 2019-03-02 DIAGNOSIS — Z0000 Encounter for general adult medical examination without abnormal findings: Secondary | ICD-10-CM | POA: Diagnosis not present

## 2019-03-15 ENCOUNTER — Telehealth: Payer: Self-pay | Admitting: *Deleted

## 2019-03-15 DIAGNOSIS — Z86010 Personal history of colonic polyps: Secondary | ICD-10-CM | POA: Diagnosis not present

## 2019-03-15 DIAGNOSIS — E119 Type 2 diabetes mellitus without complications: Secondary | ICD-10-CM | POA: Diagnosis not present

## 2019-03-15 DIAGNOSIS — R1031 Right lower quadrant pain: Secondary | ICD-10-CM | POA: Diagnosis not present

## 2019-03-15 NOTE — Telephone Encounter (Signed)
[facility] Pre-operative Risk Assessment    Request for surgical clearance:  1. What type of surgery is being performed? colonoscopy   2. When is this surgery scheduled? 05/06/19   3. What type of clearance is required (medical clearance vs. Pharmacy clearance to hold med vs. Both)? both  4. Are there any medications that need to be held prior to surgery and how long? Xarelto   5. Practice name and name of physician performing surgery? [facility], Dr. Benson Norway   6. What is your office phone number [phone]    7.   What is your office fax number [phone]  8.   Anesthesia type (None, local, MAC, general) ? propofol   Elishia Kaczorowski A Alexios Keown 03/15/2019, 4:40 PM  _________________________________________________________________

## 2019-03-15 NOTE — Telephone Encounter (Signed)
Pt has office visit scheduled for 1/28. Will await results of that visit for clearance.  I will route request for recommendations on anticoagulation to our pharmacist.

## 2019-03-16 DIAGNOSIS — Z794 Long term (current) use of insulin: Secondary | ICD-10-CM | POA: Diagnosis not present

## 2019-03-16 DIAGNOSIS — I2510 Atherosclerotic heart disease of native coronary artery without angina pectoris: Secondary | ICD-10-CM | POA: Diagnosis not present

## 2019-03-16 DIAGNOSIS — R809 Proteinuria, unspecified: Secondary | ICD-10-CM | POA: Diagnosis not present

## 2019-03-16 DIAGNOSIS — E1165 Type 2 diabetes mellitus with hyperglycemia: Secondary | ICD-10-CM | POA: Diagnosis not present

## 2019-03-16 DIAGNOSIS — E039 Hypothyroidism, unspecified: Secondary | ICD-10-CM | POA: Diagnosis not present

## 2019-03-16 NOTE — Telephone Encounter (Signed)
Patient with diagnosis of CAD on Xarelto for anticoagulation.    Procedure: colonoscopy Date of procedure: 05/06/2019  Patient is on Xarelto for CAD. I will defer to Dr. Ellyn Hack as this falls outside of our protocol.

## 2019-03-16 NOTE — Telephone Encounter (Signed)
Case discussed with Dr Ellyn Hack today. Okay to hold Aspirin and Xarelto for 5 days prior to coloscopy.

## 2019-03-18 ENCOUNTER — Ambulatory Visit: Payer: No Typology Code available for payment source | Admitting: Cardiology

## 2019-03-19 ENCOUNTER — Other Ambulatory Visit: Payer: Self-pay | Admitting: Cardiology

## 2019-03-23 DIAGNOSIS — E785 Hyperlipidemia, unspecified: Secondary | ICD-10-CM | POA: Diagnosis not present

## 2019-03-23 DIAGNOSIS — E1165 Type 2 diabetes mellitus with hyperglycemia: Secondary | ICD-10-CM | POA: Diagnosis not present

## 2019-03-23 DIAGNOSIS — I10 Essential (primary) hypertension: Secondary | ICD-10-CM | POA: Diagnosis not present

## 2019-03-23 DIAGNOSIS — E1140 Type 2 diabetes mellitus with diabetic neuropathy, unspecified: Secondary | ICD-10-CM | POA: Diagnosis not present

## 2019-03-23 DIAGNOSIS — I2510 Atherosclerotic heart disease of native coronary artery without angina pectoris: Secondary | ICD-10-CM | POA: Diagnosis not present

## 2019-03-23 DIAGNOSIS — Z794 Long term (current) use of insulin: Secondary | ICD-10-CM | POA: Diagnosis not present

## 2019-03-29 DIAGNOSIS — Z79899 Other long term (current) drug therapy: Secondary | ICD-10-CM | POA: Diagnosis not present

## 2019-03-29 DIAGNOSIS — M109 Gout, unspecified: Secondary | ICD-10-CM | POA: Diagnosis not present

## 2019-03-29 DIAGNOSIS — M1712 Unilateral primary osteoarthritis, left knee: Secondary | ICD-10-CM | POA: Diagnosis not present

## 2019-03-29 DIAGNOSIS — N183 Chronic kidney disease, stage 3 unspecified: Secondary | ICD-10-CM | POA: Diagnosis not present

## 2019-03-29 DIAGNOSIS — M25552 Pain in left hip: Secondary | ICD-10-CM | POA: Diagnosis not present

## 2019-03-29 DIAGNOSIS — N289 Disorder of kidney and ureter, unspecified: Secondary | ICD-10-CM | POA: Diagnosis not present

## 2019-03-29 DIAGNOSIS — M25551 Pain in right hip: Secondary | ICD-10-CM | POA: Diagnosis not present

## 2019-03-29 DIAGNOSIS — M1990 Unspecified osteoarthritis, unspecified site: Secondary | ICD-10-CM | POA: Diagnosis not present

## 2019-03-29 DIAGNOSIS — M25561 Pain in right knee: Secondary | ICD-10-CM | POA: Diagnosis not present

## 2019-03-29 DIAGNOSIS — M1711 Unilateral primary osteoarthritis, right knee: Secondary | ICD-10-CM | POA: Diagnosis not present

## 2019-03-29 DIAGNOSIS — M25562 Pain in left knee: Secondary | ICD-10-CM | POA: Diagnosis not present

## 2019-04-01 DIAGNOSIS — E119 Type 2 diabetes mellitus without complications: Secondary | ICD-10-CM | POA: Diagnosis not present

## 2019-04-01 DIAGNOSIS — H5201 Hypermetropia, right eye: Secondary | ICD-10-CM | POA: Diagnosis not present

## 2019-04-01 DIAGNOSIS — H524 Presbyopia: Secondary | ICD-10-CM | POA: Diagnosis not present

## 2019-04-01 DIAGNOSIS — H52203 Unspecified astigmatism, bilateral: Secondary | ICD-10-CM | POA: Diagnosis not present

## 2019-04-01 DIAGNOSIS — Z794 Long term (current) use of insulin: Secondary | ICD-10-CM | POA: Diagnosis not present

## 2019-04-05 DIAGNOSIS — N2581 Secondary hyperparathyroidism of renal origin: Secondary | ICD-10-CM | POA: Diagnosis not present

## 2019-04-05 DIAGNOSIS — R809 Proteinuria, unspecified: Secondary | ICD-10-CM | POA: Diagnosis not present

## 2019-04-05 DIAGNOSIS — N183 Chronic kidney disease, stage 3 unspecified: Secondary | ICD-10-CM | POA: Diagnosis not present

## 2019-04-05 DIAGNOSIS — M109 Gout, unspecified: Secondary | ICD-10-CM | POA: Diagnosis not present

## 2019-04-05 DIAGNOSIS — I129 Hypertensive chronic kidney disease with stage 1 through stage 4 chronic kidney disease, or unspecified chronic kidney disease: Secondary | ICD-10-CM | POA: Diagnosis not present

## 2019-04-15 DIAGNOSIS — M25561 Pain in right knee: Secondary | ICD-10-CM | POA: Diagnosis not present

## 2019-04-15 DIAGNOSIS — M25559 Pain in unspecified hip: Secondary | ICD-10-CM | POA: Diagnosis not present

## 2019-04-15 DIAGNOSIS — M109 Gout, unspecified: Secondary | ICD-10-CM | POA: Diagnosis not present

## 2019-04-15 DIAGNOSIS — Z79899 Other long term (current) drug therapy: Secondary | ICD-10-CM | POA: Diagnosis not present

## 2019-04-21 ENCOUNTER — Other Ambulatory Visit: Payer: Self-pay | Admitting: Rheumatology

## 2019-04-21 DIAGNOSIS — M25559 Pain in unspecified hip: Secondary | ICD-10-CM

## 2019-04-22 ENCOUNTER — Other Ambulatory Visit: Payer: Self-pay | Admitting: Cardiology

## 2019-04-26 ENCOUNTER — Ambulatory Visit (INDEPENDENT_AMBULATORY_CARE_PROVIDER_SITE_OTHER): Payer: Self-pay | Admitting: Physician Assistant

## 2019-04-26 ENCOUNTER — Other Ambulatory Visit: Payer: Self-pay

## 2019-04-26 ENCOUNTER — Ambulatory Visit (HOSPITAL_COMMUNITY)
Admission: RE | Admit: 2019-04-26 | Discharge: 2019-04-26 | Disposition: A | Discharge status: Home / Self Care | Payer: Medicare Other | Source: Ambulatory Visit | Attending: Surgery | Admitting: Surgery

## 2019-04-26 VITALS — BP 156/73 | HR 58 | Temp 97.3°F | Resp 18 | Ht 73.0 in | Wt 319.0 lb

## 2019-04-26 DIAGNOSIS — I6523 Occlusion and stenosis of bilateral carotid arteries: Secondary | ICD-10-CM

## 2019-04-26 DIAGNOSIS — I739 Peripheral vascular disease, unspecified: Secondary | ICD-10-CM

## 2019-04-26 NOTE — Progress Notes (Signed)
Office Note     CC:  follow up Requesting Provider:  Deland Pretty, MD  HPI: Harold [name] is a 64 y.o. ([birth year]) male who presents for routine follow-up carotid artery disease.  The patient underwent right carotid endarterectomy in 2005.  He was asymptomatic at the time but had a high-grade stenosis.  He did well postoperatively.  He currently denies symptoms of temporary monocular blindness, extremity weakness, slurred speech, confusion or lower extremity pain.  He has significant osteoarthritis of his back and hips.  The pt is on a statin for cholesterol management.  The pt is on a daily aspirin.   Other AC: Xarelto (history of coronary stents) The pt is on beta-blocker, furosemide, ARB for hypertension.   The pt is diabetic.  Insulin, Trulicity Tobacco hx: Former cigarettes, quit 2005  Past Medical History:  Diagnosis Date  . Anginal pain [facility]) March 2015   Cardiac cath showed patent stents with distal LAD, circumflex-OM and RCA disease in small vessels.  . Anxiety   . CAD S/P percutaneous coronary angioplasty 2003, 04/2012   status post PCI to LAD, circumflex-OM 2, RCA  . Carotid artery occlusion   . Chronic renal insufficiency, stage II (mild)   . Chronic venous insufficiency    varicosities, no reflux; dopplers 04/14/12- valvular insufficiency in the R and L GSV  . Complication of anesthesia   . COPD, mild ([place])   . Depression    situaltional   . Diabetes mellitus   . Dyslipidemia associated with type 2 diabetes mellitus ([place])   . GERD (gastroesophageal reflux disease)   . HTN (hypertension)   . Hypothyroidism   . Neuromuscular disorder (HCC)    neuropathy in feet  . Neuropathy    notably improved following PCI with improved cardiac function  . Obesity   . 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 PONV (postoperative nausea and vomiting)    "[place]"  . Pulmonary  hypertension ([place]) 04/2012   PA pressure 81mhg  . Sleep apnea    uses nightly    Past Surgical History:  Procedure Laterality Date  . ABDOMINAL AORTAGRAM N/A 11/15/2011   Procedure: ABDOMINAL AMaxcine Ham  Surgeon: CElam Dutch MD;  [facility];  Service: Cardiovascular;  [name];  . ABIs  04/27/2012   mild bilateral arterial insufficiency  . BACK SURGERY  2005 x1   X2-2010  . BUNIONECTOMY Right 11/11/2013   Procedure: RIGHT FOOT SILVER BUNIONECTOMY;  Surgeon: JWylene Simmer MD;  [facility]  Service: Orthopedics;  Laterality: Right;  . CARDIAC CATHETERIZATION  12/11/2001   significant 3V CAD, normal LV function  . Carotid Doppler  04/27/2012   right internal carotid: Elevated velocities but no evidence of plaque. Left internal carotid 40-59%  . CAROTID ENDARTERECTOMY  2005   Right; recent carotid Dopplers notes elevated velocities.   . colonscopy    . CORONARY ANGIOPLASTY  12/21/2001   PTCA of the distal and mid AV groove circ, unsuccessful PTCA of second OM total occlusion, unsuccessful PTCA of the apical LAD total occlusion  . CORONARY ANGIOPLASTY WITH STENT PLACEMENT  04/29/12   PCI to 3 RCA lesions, Promus Premiere 2.2666m8mm distally, mid was 2.66m43mmm and proximally 2.75x16m47mF 55-60%  . CORONARY STENT PLACEMENT  04/28/12   PCI to LAD (3x23mm67mnce DES postdilated to 3.25) and circ prox and mid (2 overlappinmg 2.66mmx114mXience DES  postdilated to 2.21m)  . DOPPLER ECHOCARDIOGRAPHY  04/28/2012   poor quality study: EF estimated 60-65%; unable to assess diastolic function (previously noted to have diastolic dysfunction); severely dilated left atrium and mild right atrium; dilated IVC consistent with increased central venous pressure..  . HERNIA REPAIR    . LEFT AND RIGHT HEART CATHETERIZATION WITH CORONARY ANGIOGRAM N/A 04/27/2012   Procedure: LEFT AND RIGHT HEART CATHETERIZATION WITH CORONARY ANGIOGRAM;  Surgeon: DLeonie Man MD;   [facility];  Service: Cardiovascular;  [name];  . LEFT AND RIGHT HEART CATHETERIZATION WITH CORONARY ANGIOGRAM N/A 05/14/2013   Procedure: LEFT AND RIGHT HEART CATHETERIZATION WITH CORONARY ANGIOGRAM;  Surgeon: TTroy Sine MD;  [facility]: Moderate Pulm HTN: 46/16 - mean 33 mmHg; PCWP 245mg;; [name], [name], Patent RCA ostial mid and distal stents - 70% distal runoff Dz  . LUMBAR LAMINECTOMY/DECOMPRESSION MICRODISCECTOMY  01/07/2012   Procedure: LUMBAR LAMINECTOMY/DECOMPRESSION MICRODISCECTOMY 1 LEVEL;  Surgeon: KyWinfield CunasMD;  [name];  Service: Neurosurgery;  Laterality: Bilateral;   Lumbar Three-Four Decompression  . PERCUTANEOUS CORONARY STENT INTERVENTION (PCI-S) N/A 04/28/2012   Procedure: PERCUTANEOUS CORONARY STENT INTERVENTION (PCI-S);  Surgeon: ThTroy SineMD;  [facility];  Service: Cardiovascular;  [name];  . PERCUTANEOUS CORONARY STENT INTERVENTION (PCI-S) N/A 04/29/2012   Procedure: PERCUTANEOUS CORONARY STENT INTERVENTION (PCI-S);  Surgeon: DaLeonie ManMD;  [facility];  Service: Cardiovascular;  [name];  . SPINE SURGERY    . UMBILICAL HERNIA REPAIR  2009   steel mesh insert    Social History   Socioeconomic History  . Marital status: Single    Spouse name: Not on file  . Number of children: 0  . Years of education: hs  . Highest education level: Not on file  Occupational History  . Occupation: [employer]  Tobacco Use  . Smoking status: Former Smoker    Packs/day: 1.00    Years: 42.00    Pack years: 42.00    Types: Cigarettes    Quit date: 03/23/2003    Years since quitting: 16.1  . Smokeless tobacco: Never Used  Substance and Sexual Activity  . Alcohol use: No    Alcohol/week: 0.0 standard drinks  . Drug use: No  . Sexual  activity: Not on file  Other Topics Concern  . Not on file  Social History Narrative   He and his partner GeIona Beardave now officially gotten married on 05/26/2013.  GeIona Beards also a patient of our clinic.  He is now initially hyphenated his last name.   He works at [employer]  He does not smoke, he quit in 2009.  He does not drink alcohol.   He was adopted and no family history is known.    Social Determinants of Health   Financial Resource Strain:   . Difficulty of Paying Living Expenses: Not on file  Food Insecurity:   . Worried About RuCharity fundraisern the Last Year: Not on file  . Ran Out of Food in the Last Year: Not on file  Transportation Needs:   . Lack of Transportation (Medical): Not on file  . Lack of Transportation (Non-Medical): Not on file  Physical Activity:   . Days of Exercise per Week: Not on file  . Minutes of Exercise  per Session: Not on file  Stress:   . Feeling of Stress : Not on file  Social Connections:   . Frequency of Communication with Friends and Family: Not on file  . Frequency of Social Gatherings with Friends and Family: Not on file  . Attends Religious Services: Not on file  . Active Member of Clubs or Organizations: Not on file  . Attends Archivist Meetings: Not on file  . Marital Status: Not on file  Intimate Partner Violence:   . Fear of Current or Ex-Partner: Not on file  . Emotionally Abused: Not on file  . Physically Abused: Not on file  . Sexually Abused: Not on file    Family History  Adopted: Yes  Family history unknown: Yes    Current Outpatient Medications  Medication Sig Dispense Refill  . allopurinol (ZYLOPRIM) 100 MG tablet Take 100 mg by mouth daily.  1  . aspirin EC 81 MG tablet Take 1 tablet (81 mg total) by mouth daily. 90 tablet 3  . canagliflozin (INVOKANA) 100 MG TABS tablet 3 (three) times daily before meals.     . carvedilol (COREG) 6.25 MG tablet Take 1 tablet (6.25 mg total) by mouth 2  (two) times daily with a meal. 180 tablet 2  . COLCRYS 0.6 MG tablet Take 0.6 mg by mouth daily.    . Continuous Blood Gluc Receiver (FREESTYLE LIBRE 14 DAY READER) DEVI FreeStyle Libre 14 Day Reader  USE AS DIRECTED    . Continuous Blood Gluc Sensor (FREESTYLE LIBRE 14 DAY SENSOR) MISC APPLY 1 SENSOR EVERY 14 DAYS  11  . empagliflozin (JARDIANCE) 25 MG TABS tablet Jardiance 25 mg tablet  TAKE 1 TABLET BY MOUTH EVERY DAY    . escitalopram (LEXAPRO) 20 MG tablet TAKE 1 TABLET (20 MG TOTAL) BY MOUTH DAILY. 30 tablet 1  . furosemide (LASIX) 80 MG tablet Take 160 mg by mouth 3 (three) times daily.  10  . Insulin Degludec (TRESIBA FLEXTOUCH) 200 UNIT/ML SOPN Inject 90 Units into the skin 2 (two) times a day.    . insulin regular human CONCENTRATED (HUMULIN R U-500 KWIKPEN) 500 UNIT/ML kwikpen Inject 65 Units into the skin 3 (three) times daily with meals.    Marland Kitchen levothyroxine (SYNTHROID, LEVOTHROID) 88 MCG tablet Take 88 mcg by mouth daily before breakfast.    . losartan (COZAAR) 25 MG tablet Take 1 tablet (25 mg total) by mouth daily. 90 tablet 3  . nitroGLYCERIN (NITROSTAT) 0.4 MG SL tablet PLACE 1 TAB UNDER THE TONGUE EVERY 5 MINUTES AS NEEDED FOR CHEST PAIN (SEVERE PRESSURE OR TIGHTNESS) 25 tablet 3  . omeprazole (PRILOSEC) 20 MG capsule TAKE 1 CAPSULE BY MOUTH EVERY DAY 30 capsule 0  . potassium chloride (MICRO-K) 10 MEQ CR capsule Take by mouth daily.  5  . rosuvastatin (CRESTOR) 20 MG tablet Take 20 mg by mouth daily.    . TRULICITY 1.5 YI/0.1KP SOPN Inject 1.5 mg into the skin once a week.  5  . XARELTO 2.5 MG TABS tablet TAKE 1 TABLET BY MOUTH TWICE A DAY 180 tablet 3   No current facility-administered medications for this visit.    Allergies  Allergen Reactions  . Septra [Bactrim] Itching  . Penicillins Hives    Allergy to All cillin drugs  Ancef given 9/24 with no obvious reaction  . Clopidogrel     NON RESPONDER  P2Y12  . Metformin And Related   . Other     Walnuts, pecans  REVIEW OF SYSTEMS:   _0  denotes positive finding, _1  denotes negative finding Cardiac  Comments:  Chest pain or chest pressure:    Shortness of breath upon exertion:    Short of breath when lying flat:    Irregular heart rhythm:        Vascular    Pain in calf, thigh, or hip brought on by ambulation: x Currently being treated for hip pain from OA  Pain in feet at night that wakes you up from your sleep:     Blood clot in your veins:    Leg swelling:         Pulmonary    Oxygen at home:    Productive cough:     Wheezing:         Neurologic    Sudden weakness in arms or legs:     Sudden numbness in arms or legs:     Sudden onset of difficulty speaking or slurred speech:    Temporary loss of vision in one eye:     Problems with dizziness:         Gastrointestinal    Blood in stool:     Vomited blood:         Genitourinary    Burning when urinating:     Blood in urine:        Psychiatric    Major depression:         Hematologic    Bleeding problems:    Problems with blood clotting too easily:        Skin    Rashes or ulcers:        Constitutional    Fever or chills:      PHYSICAL EXAMINATION:  There were no vitals filed for this visit.  General:  WDWN in NAD; vital signs documented above Gait: Steady; unaided HENT: WNL, normocephalic Pulmonary: normal non-labored breathing , without Rales, rhonchi,  wheezing Cardiac: regular HR, without  Murmurs without carotid bruits Abdomen: soft, NT, no masses Skin: without rashes Vascular Exam/Pulses: Extremities: without ischemic changes, without Gangrene , without cellulitis; without open wounds;  Musculoskeletal: no muscle wasting or atrophy  Neurologic: A&O X 3;  No focal weakness or paresthesias are detected.  Tongue is midline.  Face is symmetrical Psychiatric:  The pt has Normal affect.  Pulse exam: 2+ palpable radial and posterior tibial pulses.  Non-Invasive Vascular Imaging:   Carotid Duplex  (10-28-18): Right Carotid: Patent right carotid endarterectomy site with Doppler velocities in the right ICA consistent with a 1-39% stenosis. Left Carotid: Velocities in the left ICA are consistent with a 60-79% stenosis. Vertebrals: Bilateral vertebral arteries demonstrate antegrade flow. Subclavians: Normal flow hemodynamics were seen in bilateral subclavianarteries. Stable in the right ICA, increased stenosis in the left ICA compared to the exam on 03-14-16.   TODAY: Right Carotid: Velocities in the right ICA are consistent with a 1-39%  stenosis.         Patent CEA.   Left Carotid: Velocities in the left ICA are consistent with a 40-59%  stenosis.        The ECA appears >50% stenosed. Unable to obtain higher ICA  end        diastolic velocity as noted on previous exam. Calcific  plaque may        obscure higher velocities.   Vertebrals: Bilateral vertebral arteries demonstrate antegrade flow.  Subclavians: Normal flow hemodynamics were seen in bilateral subclavian  arteries.     ASSESSMENT/PLAN:: 64 y.o. male here for follow up for bilateral carotid artery stenoses.  Stable right ICA stenosis in the 1 to 39% range.  On the left, his percent stenosis is lower as compared to 6 months ago.  Likely due to operator variances.  We discussed this and agreed to have him return in 6 months.  We reviewed the signs and symptoms of stroke/TIA.  Check ABIs in 6 months    Barbie Banner, PA-C [facility] [phone]  Clinic MD:   Trula Slade

## 2019-04-27 ENCOUNTER — Other Ambulatory Visit: Payer: Self-pay | Admitting: *Deleted

## 2019-04-27 DIAGNOSIS — I6523 Occlusion and stenosis of bilateral carotid arteries: Secondary | ICD-10-CM

## 2019-04-27 DIAGNOSIS — I739 Peripheral vascular disease, unspecified: Secondary | ICD-10-CM

## 2019-04-29 DIAGNOSIS — M21612 Bunion of left foot: Secondary | ICD-10-CM | POA: Diagnosis not present

## 2019-04-29 DIAGNOSIS — L84 Corns and callosities: Secondary | ICD-10-CM | POA: Diagnosis not present

## 2019-04-29 DIAGNOSIS — B351 Tinea unguium: Secondary | ICD-10-CM | POA: Diagnosis not present

## 2019-04-29 DIAGNOSIS — E1351 Other specified diabetes mellitus with diabetic peripheral angiopathy without gangrene: Secondary | ICD-10-CM | POA: Diagnosis not present

## 2019-04-29 DIAGNOSIS — L602 Onychogryphosis: Secondary | ICD-10-CM | POA: Diagnosis not present

## 2019-05-02 ENCOUNTER — Ambulatory Visit
Admission: RE | Admit: 2019-05-02 | Discharge: 2019-05-02 | Disposition: A | Discharge status: Home / Self Care | Payer: Medicare Other | Source: Ambulatory Visit | Attending: Rheumatology | Admitting: Rheumatology

## 2019-05-02 ENCOUNTER — Other Ambulatory Visit: Payer: Self-pay

## 2019-05-02 DIAGNOSIS — M25551 Pain in right hip: Secondary | ICD-10-CM | POA: Diagnosis not present

## 2019-05-02 DIAGNOSIS — M25559 Pain in unspecified hip: Secondary | ICD-10-CM

## 2019-05-04 ENCOUNTER — Other Ambulatory Visit: Payer: Self-pay

## 2019-05-04 ENCOUNTER — Ambulatory Visit: Payer: Medicare Other | Admitting: Cardiology

## 2019-05-05 DIAGNOSIS — D1801 Hemangioma of skin and subcutaneous tissue: Secondary | ICD-10-CM | POA: Diagnosis not present

## 2019-05-05 DIAGNOSIS — D225 Melanocytic nevi of trunk: Secondary | ICD-10-CM | POA: Diagnosis not present

## 2019-05-05 DIAGNOSIS — L738 Other specified follicular disorders: Secondary | ICD-10-CM | POA: Diagnosis not present

## 2019-05-05 DIAGNOSIS — L820 Inflamed seborrheic keratosis: Secondary | ICD-10-CM | POA: Diagnosis not present

## 2019-05-05 DIAGNOSIS — Z85828 Personal history of other malignant neoplasm of skin: Secondary | ICD-10-CM | POA: Diagnosis not present

## 2019-05-09 ENCOUNTER — Encounter: Payer: Self-pay | Admitting: Neurology

## 2019-05-11 ENCOUNTER — Other Ambulatory Visit: Payer: Self-pay

## 2019-05-11 ENCOUNTER — Encounter: Payer: Self-pay | Admitting: Neurology

## 2019-05-11 ENCOUNTER — Ambulatory Visit: Payer: Medicare Other | Admitting: Neurology

## 2019-05-11 VITALS — BP 150/73 | HR 64 | Temp 97.2°F | Ht 73.0 in | Wt 318.0 lb

## 2019-05-11 DIAGNOSIS — G4733 Obstructive sleep apnea (adult) (pediatric): Secondary | ICD-10-CM | POA: Diagnosis not present

## 2019-05-11 DIAGNOSIS — I272 Pulmonary hypertension, unspecified: Secondary | ICD-10-CM | POA: Diagnosis not present

## 2019-05-11 DIAGNOSIS — I5032 Chronic diastolic (congestive) heart failure: Secondary | ICD-10-CM | POA: Diagnosis not present

## 2019-05-11 DIAGNOSIS — E1149 Type 2 diabetes mellitus with other diabetic neurological complication: Secondary | ICD-10-CM | POA: Diagnosis not present

## 2019-05-11 DIAGNOSIS — E66813 Obesity, class 3: Secondary | ICD-10-CM

## 2019-05-11 DIAGNOSIS — Z9989 Dependence on other enabling machines and devices: Secondary | ICD-10-CM

## 2019-05-11 NOTE — Progress Notes (Addendum)
SLEEP MEDICINE CLINIC   Provider:  Larey Seat, M D  Referring Provider: Deland Pretty, MD Primary Care Physician:  Deland Pretty, MD  Chief Complaint  Patient presents with  . Follow-up    pt alone, rm 10. pt states things are well no concerns with the machine. DME [employer]     RV- 05-11-2019:  Harold [name] is now 64 years-old,  Followed for OSA on CPAP .He has received both Covid shots and is feeling fell.  Has HTN , BMI is 41 , a former smoker with COPD  He sleeps well, deep, refreshing- He has still 2 bathroom breaks , attributed to diuretic.  He has more energy than before. His DM is better controlled, HbA1c 7.4. 165 glucose right now.   He used to snore very loudly and CPAP has controlled up perfectly.  In 2018 we confirmed his diagnosis of severe obstructive sleep apnea and a sleep study.  Since then he has a new machine which is an auto titrating device ranging device and he has been 100% compliant by days and hourly use.  Average use at time of 10 hours 51 minutes, is an AutoSet set between 5 cm water pressure and 15 is full-time expiratory pressure relief of 3 cm.  His 95th percentile pressure need is 12.6 which is relates to a residual AHI of only 1.9/h.  There are very few air leaks and they usually seem to happen at peak times so probably related to bathroom breaks.  No central apneas have arisen.  I would like to give the patient 1 or 2 cm more pressure which will help to further reduce the apnea but the quality of his sleep has significantly increased.  The Epworth Sleepiness Scale was endorsed at only 2 out of 24 points the fatigue severity at 41 out of 63 a lot of this still attributed to diabetes related fatigue.  He is in the process of losing some weight he does not endorse depression.  Sicial history :  He and his partner [name] have now officially gotten married on 05/26/2013.  [name] is also a patient of our clinic.  He is now initially hyphenated his last name.  He works at  [employer].  He does not smoke, he quit in 2005. In time for CEA surgery-  He does not drink alcohol.  He was adopted and no family history is known.     03-21-2016 Harold [name] is a 64 y.o. male , seen here as a referral  from Dr. Shelia Media , patient needs CPAP follow up after a 5.5 year hiatus. Harold [name] underwent a sleep study on 10/01/2010 and at the time was diagnosed with severe sleep apnea at an AHI of 63.2 per hour there was no REM sleep noted at the patient slept all night in nonsupine position. It was likely that he would have had worse apnea would he have entered REM sleep or supine sleep. His desaturation index per hour was 67.4 and he spent 45.7 minutes of desaturation time in total. Oxygen nadir was 74%. CPAP was initiated at 6 cm water pressure and titrated to 11 cm the overall AHI was 3.1 the desaturation time was restricted to 7.4 minutes and the patient slept with immediate REM sleep rebound. In the meantime he had continued to use CPAP compliantly but his machine is not downloadable here in our office today. It seems that the memory chip is corrupted. Prior to CPAP use he fell asleep driving, at work he  was working in a call center and didn't have much physical activity. He struggled to stay awake after each lunch. He fell asleep during a conversation with a customer on the phone. He had endorsed the Epworth score at 21 out of 24 points. Using CPAP has helped him to control his diabetes, has hypertension and he was very compliant. I saw him last on 03/20/2012 just after he had returned from back surgery he underwent fusion between the fourth and fifth lumbar spinal vertebrae with [name].  He is here today to continue sleep care. He states that his CPAP is working fine for him and that he wouldn't sleep without it.  The patient has a history of coronary artery disease with percutaneous coronary angioplasty in 2003 at 2014, chest pain was noted in March 2015 stents  within the distal LAD and our see a. He has chronic renal insufficiency stage II, chronic venous insufficiency sometimes wears a TED hose for support diabetes mellitus with diabetes. Neuropathy, GERD with possible contribution by gastroparesis, he is in the obesity class II BMI 35-39 with comorbidities, as pulmonary hypertension with a PA pressure is 58 mmHg and has obstructive sleep apnea for which he uses CPAP. Just last week he underwent a carotid ultrasound and was told that his carotid arteries are patent. He is in the work up for COPD- SOB. 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    Interval history, from 08/20/2016 visit with Harold [name]. The patient has started physical therapy to help his back pain and chronic conditions. Sleep has much improved since he has been diagnosed with apnea again and treated, he wakes only up for one time at night. Sleep is sounder and less fragmented. I could hear from the split-night titration study performed on 05/10/2016 as ordered by Dr. Shelia Media. The patient developed a significant amount of obstructive apneas and hypotony as, he was also loud snoring. His overall AHI was 63.2 averaging 1 respiratory event per minute of sleep. During REM the became 77.8 and in supine 60 per hour. He had 21 minutes of desaturation time in toto. He did not have periodic limb movements he was titrated to 11 cm water pressure with 3 cm EPR. A ResMed mask was used t air-fit N 20 in large size.     Today is 12/05/2016 and I have the pleasure of seeing [name] for CPAP compliance visit. The patient underwent a sleep study in March of this year which documented very fragmented sleep for the first half of the recorded night and much improvement after CPAP was initiated. He is now using an auto titrated between 5 and 15 cm water pressure with 3 cm EPR, average user time is 9 hours and 34 minutes, with a 90% compliance as he lost 3  days due to power outage in the wake of headache and [name]. The 91st percentile pressure needs is 10.5 cm water, the 95th percentile 39.9 L/m. The patient uses a large nasal  mask now at home which is not the mask he was originally given after the sleep study. He cannot recall which type the mask is but I will be happy to refill whatever he is most comfortable with.  Review of Systems: Out of a complete 14 system review, the patient complains of only the following symptoms, and all other reviewed systems are negative. Obese, CPAP user. Chest pain, much improved SOB, sleep is sustained and refreshing.  Epworth score on new CPAP form 9 points down to 2/ 24, Fatigue severity score 41/ 63  depression score 1/15   Social History   Socioeconomic History  . Marital status: Single    Spouse name: Not on file  . Number of children: 0  . Years of education: hs  . Highest education level: Not on file  Occupational History  . Occupation: Brewing technologist: replacement limited  Tobacco Use  . Smoking status: Former Smoker    Packs/day: 1.00    Years: 42.00    Pack years: 42.00    Types: Cigarettes    Quit date: 03/23/2003    Years since quitting: 16.1  . Smokeless tobacco: Never Used  Substance and Sexual Activity  . Alcohol use: No    Alcohol/week: 0.0 standard drinks  . Drug use: No  . Sexual activity: Not on file  Other Topics Concern  . Not on file  Social History Narrative   He and his partner [name] have now officially gotten married on 05/26/2013.  [name] is also a patient of our clinic.  He is now initially hyphenated his last name.   He works at [employer].   He does not smoke, he quit in 2009.  He does not drink alcohol.   He was adopted and no family history is known.    Social Determinants of Health   Financial Resource Strain:   . Difficulty of Paying Living Expenses:   Food Insecurity:   . Worried About Charity fundraiser in the Last Year:   .  Arboriculturist in the Last Year:   Transportation Needs:   . Film/video editor (Medical):   Marland Kitchen Lack of Transportation (Non-Medical):   Physical Activity:   . Days of Exercise per Week:   . Minutes of Exercise per Session:   Stress:   . Feeling of Stress :   Social Connections:   . Frequency of Communication with Friends and Family:   . Frequency of Social Gatherings with Friends and Family:   . Attends Religious Services:   . Active Member of Clubs or Organizations:   . Attends Archivist Meetings:   Marland Kitchen Marital Status:   Intimate Partner Violence:   . Fear of Current or Ex-Partner:   . Emotionally Abused:   Marland Kitchen Physically Abused:   . Sexually Abused:     Family History  Adopted: Yes  Family history unknown: Yes    Past Medical History:  Diagnosis Date  . Anginal pain [facility]) March 2015   Cardiac cath showed patent stents with distal LAD, circumflex-OM and RCA disease in small vessels.  . Anxiety   . CAD S/P percutaneous coronary angioplasty 2003, 04/2012   status post PCI to LAD, circumflex-OM 2, RCA  . Carotid artery occlusion   . Chronic renal insufficiency, stage II (mild)   . Chronic venous insufficiency    varicosities, no reflux; dopplers 04/14/12- valvular insufficiency in the R and L GSV  . Complication of anesthesia   . COPD, mild ([place])   . Depression    situaltional   . Diabetes mellitus   . Dyslipidemia associated with type 2 diabetes mellitus ([place])   . GERD (gastroesophageal reflux disease)   . HTN (hypertension)   . Hypothyroidism   . Neuromuscular disorder (HCC)    neuropathy in feet  . Neuropathy    notably improved following PCI with improved cardiac function  . Obesity   .  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 PONV (postoperative nausea and vomiting)    "[place]"  . Pulmonary hypertension ([place]) 04/2012   PA pressure 69mhg  . Sleep apnea     uses nightly    Past Surgical History:  Procedure Laterality Date  . ABDOMINAL AORTAGRAM N/A 11/15/2011   Procedure: ABDOMINAL AMaxcine Ham  Surgeon: CElam Dutch MD;  [facility];  Service: Cardiovascular;  [name];  . ABIs  04/27/2012   mild bilateral arterial insufficiency  . BACK SURGERY  2005 x1   X2-2010  . BUNIONECTOMY Right 11/11/2013   Procedure: RIGHT FOOT SILVER BUNIONECTOMY;  Surgeon: JWylene Simmer MD;  [facility]  Service: Orthopedics;  Laterality: Right;  . CARDIAC CATHETERIZATION  12/11/2001   significant 3V CAD, normal LV function  . Carotid Doppler  04/27/2012   right internal carotid: Elevated velocities but no evidence of plaque. Left internal carotid 40-59%  . CAROTID ENDARTERECTOMY  2005   Right; recent carotid Dopplers notes elevated velocities.   . colonscopy    . CORONARY ANGIOPLASTY  12/21/2001   PTCA of the distal and mid AV groove circ, unsuccessful PTCA of second OM total occlusion, unsuccessful PTCA of the apical LAD total occlusion  . CORONARY ANGIOPLASTY WITH STENT PLACEMENT  04/29/12   PCI to 3 RCA lesions, Promus Premiere 2.27106m8mm distally, mid was 2.106m61mmm and proximally 2.75x16m1106mF 55-60%  . CORONARY STENT PLACEMENT  04/28/12   PCI to LAD (3x23mm36mnce DES postdilated to 3.25) and circ prox and mid (2 overlappinmg 2.106mmx182mXience DES postdilated to 2.7106mm) 38mOPPLER ECHOCARDIOGRAPHY  04/28/2012   poor quality study: EF estimated 60-65%; unable to assess diastolic function (previously noted to have diastolic dysfunction); severely dilated left atrium and mild right atrium; dilated IVC consistent with increased central venous pressure..  . HERNIA REPAIR    . LEFT AND RIGHT HEART CATHETERIZATION WITH CORONARY ANGIOGRAM N/A 04/27/2012   Procedure: LEFT AND RIGHT HEART CATHETERIZATION WITH CORONARY ANGIOGRAM;  Surgeon: David WLeonie ManLocation[facility];  Service: Cardiovascular;  [name];  .  LEFT AND RIGHT HEART CATHETERIZATION WITH CORONARY ANGIOGRAM N/A 05/14/2013   Procedure: LEFT AND RIGHT HEART CATHETERIZATION WITH CORONARY ANGIOGRAM;  Surgeon: Thomas Troy SineLocation: [facility]: Moderate Pulm HTN: 46/16 - mean 33 mmHg; PCWP 22mmHg;84mltivessel CAD with widely patent mid LAD stents and 90% apical LAD, [name], Patent RCA ostial mid and distal stents - 70% distal runoff Dz  . LUMBAR LAMINECTOMY/DECOMPRESSION MICRODISCECTOMY  01/07/2012   Procedure: LUMBAR LAMINECTOMY/DECOMPRESSION MICRODISCECTOMY 1 LEVEL;  Surgeon: Kyle L CWinfield Cunasocation: [facility] NEURO ORS;  Service: Neurosurgery;  Laterality: Bilateral;   Lumbar Three-Four Decompression  . PERCUTANEOUS CORONARY STENT INTERVENTION (PCI-S) N/A 04/28/2012   Procedure: PERCUTANEOUS CORONARY STENT INTERVENTION (PCI-S);  Surgeon: Thomas ATroy Sineocation[facility];  Service: Cardiovascular;  [name];  . PERCUTANEOUS CORONARY STENT INTERVENTION (PCI-S) N/A 04/29/2012   Procedure: PERCUTANEOUS CORONARY STENT INTERVENTION (PCI-S);  Surgeon: David W Leonie Manocation[facility];  Service: Cardiovascular;  [name];  . SPINE SURGERY    . UMBILICAL HERNIA REPAIR  2009   steel mesh insert    Current Outpatient Medications  Medication Sig Dispense Refill  . allopurinol (ZYLOPRIM) 100 MG tablet Take 100 mg by mouth  daily.  1  . aspirin EC 81 MG tablet Take 1 tablet (81 mg total) by mouth daily. 90 tablet 3  . canagliflozin (INVOKANA) 100 MG TABS tablet 3 (three) times daily before meals.     . carvedilol (COREG) 6.25 MG tablet Take 1 tablet (6.25 mg total) by mouth 2 (two) times daily with a meal. 180 tablet 2  . COLCRYS 0.6 MG tablet Take 0.6 mg by mouth daily.    . Continuous Blood Gluc Receiver (FREESTYLE LIBRE 14 DAY READER) DEVI FreeStyle Libre 14 Day Reader  USE AS DIRECTED    . Continuous Blood Gluc Sensor (FREESTYLE LIBRE 14 DAY SENSOR) MISC APPLY 1 SENSOR EVERY  14 DAYS  11  . empagliflozin (JARDIANCE) 25 MG TABS tablet Jardiance 25 mg tablet  TAKE 1 TABLET BY MOUTH EVERY DAY    . escitalopram (LEXAPRO) 20 MG tablet TAKE 1 TABLET (20 MG TOTAL) BY MOUTH DAILY. 30 tablet 1  . furosemide (LASIX) 80 MG tablet Take 160 mg by mouth 3 (three) times daily.  10  . Insulin Degludec (TRESIBA FLEXTOUCH) 200 UNIT/ML SOPN Inject 90 Units into the skin 2 (two) times a day.    . insulin regular human CONCENTRATED (HUMULIN R U-500 KWIKPEN) 500 UNIT/ML kwikpen Inject 65 Units into the skin 3 (three) times daily with meals.    Marland Kitchen levothyroxine (SYNTHROID, LEVOTHROID) 88 MCG tablet Take 88 mcg by mouth daily before breakfast.    . nitroGLYCERIN (NITROSTAT) 0.4 MG SL tablet PLACE 1 TAB UNDER THE TONGUE EVERY 5 MINUTES AS NEEDED FOR CHEST PAIN (SEVERE PRESSURE OR TIGHTNESS) 25 tablet 3  . omeprazole (PRILOSEC) 20 MG capsule TAKE 1 CAPSULE BY MOUTH EVERY DAY 30 capsule 0  . potassium chloride (MICRO-K) 10 MEQ CR capsule Take by mouth daily.  5  . rosuvastatin (CRESTOR) 20 MG tablet Take 20 mg by mouth daily.    . TRULICITY 1.5 RT/0.2TR SOPN Inject 1.5 mg into the skin once a week.  5  . XARELTO 2.5 MG TABS tablet TAKE 1 TABLET BY MOUTH TWICE A DAY 180 tablet 3  . losartan (COZAAR) 25 MG tablet Take 1 tablet (25 mg total) by mouth daily. 90 tablet 3   No current facility-administered medications for this visit.    Allergies as of 05/11/2019 - Review Complete 05/11/2019  Allergen Reaction Noted  . Septra [bactrim] Itching 04/20/2011  . Penicillins Hives 04/20/2011  . Clopidogrel  10/04/2013  . Metformin and related  03/07/2015    Vitals: BP (!) 150/73   Pulse 64   Temp (!) 97.2 F (36.2 C)   Ht _0  (1.854 m)   Wt (!) 318 lb (144.2 kg)   BMI 41.96 kg/m  Last Weight:  Wt Readings from Last 1 Encounters:  05/11/19 (!) 318 lb (144.2 kg)   ZNB:VAPO mass index is 41.96 kg/m.     Last Height:   Ht Readings from Last 1 Encounters:  05/11/19 _1  (1.854 m)     Physical exam:  General: The patient is awake, alert and appears not in acute distress. The patient is groomed. Head: Normocephalic, atraumatic. Neck is supple. Mallampati 5, neck circumference:21. Nasal airflow patent , TMJ is not evident . Retrognathia is not seen.  Cardiovascular:  Regular rate and rhythm , without  murmurs or carotid bruit, and without distended neck veins. Respiratory: Lungs are clear to auscultation. He is less tachypneic, 17/min. He is not wheezing ,but interrupts his speech forcatching a breath.   Skin:  Without evidence of edema, or rash. Trunk: BMI remains 41.9 elevated , obesity grade 3. The patient's posture is erect     Neurologic exam : The patient is awake and alert, oriented to place and time.   Attention span & concentration ability appears normal. Speech is fluent, interrupted by shortness of breath. Mood and affect are appropriate.  Cranial nerves: There is change in his  taste and smell- he has felt he has a blunted olfactory sense. Pupils are equal and briskly reactive to light.  Funduscopic exam without evidence of pallor or edema.   Extraocular movements in vertical and horizontal planes intact and without nystagmus. Visual fields by finger perimetry are intact. Facial sensation intact to fine touch.Facial motor strength is symmetric and tongue and uvula move midline.  Motor exam:  Normal tone, muscle bulk and symmetric strength - Stance is stable and normal.  Deep tendon reflexes: in the upper and lower extremities are symmetric and intact.   The patient was advised of the nature of the diagnosed sleep disorder , the treatment options and risks for general a health and wellness arising from not treating the condition.    I spent more than 26 minutes of face to face time with the patient. Greater than 50% of time was spent in counseling and coordination of care. We have discussed the diagnosis and differential and I answered the patient's  questions.    I have been very happy with his compliance, not worried about EDS any longer.      Assessment:  After physical and neurologic examination, review of laboratory studies,  Personal review of imaging studies, reports of other /same  Imaging studies ,  Results of polysomnography/ neurophysiology testing and pre-existing records as far as provided in visit., my assessment is   1) severe  OSA confirmed, he began using auto titration capable device- on CPAP - and his sleep has much improved. I will adjust the pressure up by 2 cm water. leave EPR.    2) Suspected COPD, history of coronary artery disease, status post carotid endarterectomy right side, angioplasty with stent placement, recurrent angina pectoris. Dyslipidemia, diabetes mellitus2, gastroesophageal reflux disease, hypertension, hypothyroidism neuropathy related to diabetes leg pain also related to peripheral vascular disease, the patient's most recent Doppler studies  ( 2019) show normal venous return.  RV in 12 month.  Patient has changed to [facility].  I send the pressure updates to the new DME>     Larey Seat MD  05/11/2019   CC: Deland Pretty, [address] [place],  [place] [address]

## 2019-05-16 ENCOUNTER — Other Ambulatory Visit: Payer: Self-pay | Admitting: Cardiology

## 2019-05-17 DIAGNOSIS — Z794 Long term (current) use of insulin: Secondary | ICD-10-CM | POA: Diagnosis not present

## 2019-05-17 DIAGNOSIS — E1165 Type 2 diabetes mellitus with hyperglycemia: Secondary | ICD-10-CM | POA: Diagnosis not present

## 2019-05-17 DIAGNOSIS — I2510 Atherosclerotic heart disease of native coronary artery without angina pectoris: Secondary | ICD-10-CM | POA: Diagnosis not present

## 2019-05-17 DIAGNOSIS — E039 Hypothyroidism, unspecified: Secondary | ICD-10-CM | POA: Diagnosis not present

## 2019-05-17 DIAGNOSIS — R809 Proteinuria, unspecified: Secondary | ICD-10-CM | POA: Diagnosis not present

## 2019-05-17 DIAGNOSIS — M25551 Pain in right hip: Secondary | ICD-10-CM | POA: Diagnosis not present

## 2019-05-19 ENCOUNTER — Other Ambulatory Visit: Payer: Self-pay | Admitting: Cardiology

## 2019-05-20 DIAGNOSIS — K635 Polyp of colon: Secondary | ICD-10-CM | POA: Diagnosis not present

## 2019-05-20 DIAGNOSIS — D124 Benign neoplasm of descending colon: Secondary | ICD-10-CM | POA: Diagnosis not present

## 2019-05-20 DIAGNOSIS — K5730 Diverticulosis of large intestine without perforation or abscess without bleeding: Secondary | ICD-10-CM | POA: Diagnosis not present

## 2019-05-20 DIAGNOSIS — D123 Benign neoplasm of transverse colon: Secondary | ICD-10-CM | POA: Diagnosis not present

## 2019-05-20 DIAGNOSIS — Z86010 Personal history of colonic polyps: Secondary | ICD-10-CM | POA: Diagnosis not present

## 2019-05-24 DIAGNOSIS — E785 Hyperlipidemia, unspecified: Secondary | ICD-10-CM | POA: Diagnosis not present

## 2019-05-24 DIAGNOSIS — E1140 Type 2 diabetes mellitus with diabetic neuropathy, unspecified: Secondary | ICD-10-CM | POA: Diagnosis not present

## 2019-05-24 DIAGNOSIS — I10 Essential (primary) hypertension: Secondary | ICD-10-CM | POA: Diagnosis not present

## 2019-05-24 DIAGNOSIS — M25551 Pain in right hip: Secondary | ICD-10-CM | POA: Diagnosis not present

## 2019-05-24 DIAGNOSIS — E039 Hypothyroidism, unspecified: Secondary | ICD-10-CM | POA: Diagnosis not present

## 2019-05-24 DIAGNOSIS — M109 Gout, unspecified: Secondary | ICD-10-CM | POA: Diagnosis not present

## 2019-05-31 DIAGNOSIS — E039 Hypothyroidism, unspecified: Secondary | ICD-10-CM | POA: Diagnosis not present

## 2019-05-31 DIAGNOSIS — I10 Essential (primary) hypertension: Secondary | ICD-10-CM | POA: Diagnosis not present

## 2019-06-01 DIAGNOSIS — M25551 Pain in right hip: Secondary | ICD-10-CM | POA: Diagnosis not present

## 2019-06-09 ENCOUNTER — Other Ambulatory Visit: Payer: Self-pay | Admitting: Cardiology

## 2019-06-14 ENCOUNTER — Other Ambulatory Visit: Payer: Self-pay

## 2019-06-14 ENCOUNTER — Encounter: Payer: Self-pay | Admitting: Cardiology

## 2019-06-14 ENCOUNTER — Ambulatory Visit (INDEPENDENT_AMBULATORY_CARE_PROVIDER_SITE_OTHER): Payer: Medicare Other | Admitting: Cardiology

## 2019-06-14 VITALS — BP 130/74 | HR 64 | Temp 97.2°F | Ht 73.0 in | Wt 320.6 lb

## 2019-06-14 DIAGNOSIS — Z9861 Coronary angioplasty status: Secondary | ICD-10-CM

## 2019-06-14 DIAGNOSIS — E1169 Type 2 diabetes mellitus with other specified complication: Secondary | ICD-10-CM

## 2019-06-14 DIAGNOSIS — I70213 Atherosclerosis of native arteries of extremities with intermittent claudication, bilateral legs: Secondary | ICD-10-CM | POA: Diagnosis not present

## 2019-06-14 DIAGNOSIS — I5032 Chronic diastolic (congestive) heart failure: Secondary | ICD-10-CM

## 2019-06-14 DIAGNOSIS — I2510 Atherosclerotic heart disease of native coronary artery without angina pectoris: Secondary | ICD-10-CM | POA: Diagnosis not present

## 2019-06-14 DIAGNOSIS — I10 Essential (primary) hypertension: Secondary | ICD-10-CM | POA: Diagnosis not present

## 2019-06-14 DIAGNOSIS — E782 Mixed hyperlipidemia: Secondary | ICD-10-CM

## 2019-06-14 NOTE — Patient Instructions (Addendum)
Medication Instructions:  NO CHANGES  *If you need a refill on your cardiac medications before your next appointment, please call your pharmacy*   Lab Work: NOT NEEDED   Testing/Procedures: NOT NEEDED   Follow-Up: At [facility], you and your health needs are our priority.  As part of our continuing mission to provide you with exceptional heart care, we have created designated Provider Care Teams.  These Care Teams include your primary Cardiologist (physician) and Advanced Practice Providers (APPs -  Physician Assistants and Nurse Practitioners) who all work together to provide you with the care you need, when you need it.   Your next appointment:   12 month(s)  The format for your next appointment:   In Person  Provider:   Glenetta Hew, MD   Other Instructions N/A

## 2019-06-14 NOTE — Progress Notes (Signed)
Primary Care Provider: Deland Pretty, MD Cardiologist: Glenetta Hew, MD Electrophysiologist: None  Clinic Note: Chief Complaint  Patient presents with  . Follow-up    Delayed annual  . Coronary Artery Disease    No angina   HPI:    Harold [name] is a 64 y.o. morbidly obese male with a PMH notable for MV-CAD (multivessel PCI chosen over CABG), Carotid Dz (R CEA 2005), DM-II, HTN, HLD, OSA on CPAP, & Venous Stasis (previously with severe stasis dermatitis/edema -- now notably improved) who presents today for delayed cardiology follow-up because of COVID-19 restrictions.Harold [name] was last seen on February 27, 2018 as a work in visit for atypical chest pain evaluation that sounded much more consistent with GI symptoms.    Recent Hospitalizations: none  He was recently seen by [facility] (04/26/19) for follow-up of his carotid artery disease.  Reviewed  CV studies:    The following studies were reviewed today: (if available, images/films reviewed: From Epic Chart or Care Everywhere) . Carotid Dopplers March 2021: RICA 1-39% & Patent RCEA; L ICA 40-59%,  LECA > 50% (unable to measure higher ICA) - velocities/ stenosis lower that previous.  Normal Bilateral Vertebral & Subclavian A dopplers flow.   Interval History:   Harold [name] is here today in person and in great spirits.  He is doing quite well.  He is very happy with his blood pressure recordings today indicating that his pressures seem to have improved since he "retired "from his job last fall.  As it turned out shortly after turning 63, he was "fired "because of interaction with a client that was misconstrued.  It was an amicable decision for him to stop working and going to retirement.  He is now on Medicare, and his level of stress and anxiety is notably improved.    He is still try to work on his diet and trying to lose weight, but has really had a hard time because of  his back pain.  His legs are doing amazingly well.  He is not having any further issues of significant swelling or cellulitis.  He is not even having to routinely wear support hose.  He is not having any PND orthopnea.  Certainly he will get little bit short of breath if he does vigorous exertion, but not with routine activity.  The major symptom other than back pain that is bothering him now is that he has been having bilateral abdominal pain along the increased abdominal girth that lasts for hours at a time.  It is oftentimes improved with bowel movements but he is noting intermittent diarrhea and constipation.  He was given a tentative diagnosis of IBS and is scheduled to have GI referral.  He tells me that probably the best system he has going on right now is his heart with no major symptoms.  CV Review of Symptoms (Summary) Cardiovascular ROS: no chest pain or dyspnea on exertion positive for - dyspnea on exertion and Notably improved edema.  No real orthopnea but has to sleep with CPAP. negative for - irregular heartbeat, palpitations, paroxysmal nocturnal dyspnea, rapid heart rate, shortness of breath or Syncope/near syncope or TIA/amaurosis fugax, claudication  The patient does not have symptoms concerning for COVID-19 infection (fever, chills, cough, or new shortness of breath).  The patient is practicing social distancing & Masking.   He has had both his Covid vaccine injections and indicates that both he and Harold [name] have  been vaccinated and he is ready to move on in life.  REVIEWED OF SYSTEMS   Review of Systems  Constitutional: Negative for malaise/fatigue (Seems to be a little more active and energetic especially since he is not working) and weight loss (This seems to still be elusive).  HENT: Negative for congestion, nosebleeds and sinus pain.   Respiratory: Positive for shortness of breath (Just his baseline from obesity).   Cardiovascular: Negative for leg swelling (He still has  some mild varicose veins but this is been an area of great improvement.).  Gastrointestinal: Positive for abdominal pain, constipation, diarrhea and nausea. Negative for blood in stool and melena.       Has diagnosis of possible IBS  Musculoskeletal: Positive for back pain and joint pain. Negative for falls.  Neurological: Positive for tingling. Negative for dizziness, focal weakness, weakness and headaches.  Psychiatric/Behavioral: Negative for depression and memory loss. The patient is not nervous/anxious and does not have insomnia.    I have reviewed and (if needed) personally updated the patient's problem list, medications, allergies, past medical and surgical history, social and family history.   PAST MEDICAL HISTORY   Past Medical History:  Diagnosis Date  . Anginal pain [facility]) March 2015   Cardiac cath showed patent stents with distal LAD, circumflex-OM and RCA disease in small vessels.  . Anxiety   . CAD S/P percutaneous coronary angioplasty 2003, 04/2012   status post PCI to LAD, circumflex-OM 2, RCA  . Carotid artery occlusion   . Chronic renal insufficiency, stage II (mild)   . Chronic venous insufficiency    varicosities, no reflux; dopplers 04/14/12- valvular insufficiency in the R and L GSV  . Complication of anesthesia   . COPD, mild ([place])   . Depression    situaltional   . Diabetes mellitus   . Dyslipidemia associated with type 2 diabetes mellitus ([place])   . GERD (gastroesophageal reflux disease)   . HTN (hypertension)   . Hypothyroidism   . Neuromuscular disorder (HCC)    neuropathy in feet  . Neuropathy    notably improved following PCI with improved cardiac function  . Obesity   . 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 PONV (postoperative nausea and vomiting)    "[place]"  . Pulmonary hypertension ([place]) 04/2012   PA pressure 84mhg  . Sleep apnea    uses nightly     PAST SURGICAL HISTORY   Past Surgical History:  Procedure Laterality Date  . ABDOMINAL AORTAGRAM N/A 11/15/2011   Procedure: ABDOMINAL AMaxcine Ham  Surgeon: CElam Dutch MD;  [facility];  Service: Cardiovascular;  [name];  . ABIs  04/27/2012   mild bilateral arterial insufficiency  . BACK SURGERY  2005 x1   X2-2010  . BUNIONECTOMY Right 11/11/2013   Procedure: RIGHT FOOT SILVER BUNIONECTOMY;  Surgeon: JWylene Simmer MD;  [place] [facility]  Service: Orthopedics;  Laterality: Right;  . CARDIAC CATHETERIZATION  12/11/2001   significant 3V CAD, normal LV function  . Carotid Doppler  04/27/2012   right internal carotid: Elevated velocities but no evidence of plaque. Left internal carotid 40-59%  . CAROTID ENDARTERECTOMY  2005   Right; recent carotid Dopplers notes elevated velocities.   . colonscopy    . CORONARY ANGIOPLASTY  12/21/2001   PTCA of the distal and mid AV groove circ, unsuccessful PTCA of second OM total occlusion,  unsuccessful PTCA of the apical LAD total occlusion  . CORONARY ANGIOPLASTY WITH STENT PLACEMENT  04/29/12   PCI to 3 RCA lesions, Promus Premiere 2.51mx8mm distally, mid was 2.564m8mm and proximally 2.75x1630mEF 55-60%  . CORONARY STENT PLACEMENT  04/28/12   PCI to LAD (3x23m85mence DES postdilated to 3.25) and circ prox and mid (2 overlappinmg 2.5mmx59mmXience DES postdilated to 2.75mm)79mDOPPLER ECHOCARDIOGRAPHY  04/28/2012   poor quality study: EF estimated 60-65%; unable to assess diastolic function (previously noted to have diastolic dysfunction); severely dilated left atrium and mild right atrium; dilated IVC consistent with increased central venous pressure..  . HERNIA REPAIR    . LEFT AND RIGHT HEART CATHETERIZATION WITH CORONARY ANGIOGRAM N/A 04/27/2012   Procedure: LEFT AND RIGHT HEART CATHETERIZATION WITH CORONARY ANGIOGRAM;  Surgeon: Hanan Mcwilliams Leonie Man [facility];  Service: Cardiovascular;   [name];  . LEFT AND RIGHT HEART CATHETERIZATION WITH CORONARY ANGIOGRAM N/A 05/14/2013   Procedure: LEFT AND RIGHT HEART CATHETERIZATION WITH CORONARY ANGIOGRAM;  Surgeon: ThomasTroy Sine [facility]: Moderate Pulm HTN: 46/16 - mean 33 mmHg; PCWP 22mmHg34[name], [name], Patent RCA ostial mid and distal stents - 70% distal runoff Dz  . LUMBAR LAMINECTOMY/DECOMPRESSION MICRODISCECTOMY  01/07/2012   Procedure: LUMBAR LAMINECTOMY/DECOMPRESSION MICRODISCECTOMY 1 LEVEL;  Surgeon: Kyle L Winfield CunasLocation: [facility];  Service: Neurosurgery;  Laterality: Bilateral;   Lumbar Three-Four Decompression  . PERCUTANEOUS CORONARY STENT INTERVENTION (PCI-S) N/A 04/28/2012   Procedure: PERCUTANEOUS CORONARY STENT INTERVENTION (PCI-S);  Surgeon: Thomas Troy Sine[facility];  Service: Cardiovascular;  [name];  . PERCUTANEOUS CORONARY STENT INTERVENTION (PCI-S) N/A 04/29/2012   Procedure: PERCUTANEOUS CORONARY STENT INTERVENTION (PCI-S);  Surgeon: Zi Newbury WLeonie ManLocation[facility];  Service: Cardiovascular;  [name];  . SPINE SURGERY    . UMBILICAL HERNIA REPAIR  2009   steel mesh insert    MEDICATIONS/ALLERGIES   Current Meds  Medication Sig  . allopurinol (ZYLOPRIM) 100 MG tablet Take 100 mg by mouth daily.  . aspirMarland Kitchenn EC 81 MG tablet Take 1 tablet (81 mg total) by mouth daily.  . carvedilol (COREG) 6.25 MG tablet TAKE 1 TABLET (6.25 MG TOTAL) BY MOUTH 2 (TWO) TIMES DAILY WITH A MEAL.  . COLCRMarland KitchenS 0.6 MG tablet Take 0.6 mg by mouth daily.  . Continuous Blood Gluc Receiver (FREESTYLE LIBRE 14 DAY READER) DEVI FreeStyle Libre 14 Day Reader  USE AS DIRECTED  . Continuous Blood Gluc Sensor (FREESTYLE LIBRE 14 DAY SENSOR) MISC APPLY 1 SENSOR EVERY 14 DAYS  . empagliflozin (JARDIANCE) 25 MG TABS tablet Jardiance 25 mg tablet  TAKE 1 TABLET BY MOUTH  EVERY DAY  . escitalopram (LEXAPRO) 20 MG tablet TAKE 1 TABLET (20 MG TOTAL) BY MOUTH DAILY.  . furosemide (LASIX) 80 MG tablet Take 80 mg by mouth 3 (three) times daily. TAKE 160 MG BY MOUTH 3 ( THREE) TIMES DAILY  . insulin regular human CONCENTRATED (HUMULIN R U-500 KWIKPEN) 500 UNIT/ML kwikpen Inject 110 Units into the skin 3 (three) times daily with meals.  . levotMarland Kitchenyroxine (SYNTHROID, LEVOTHROID) 88 MCG tablet Take 88 mcg by mouth daily before breakfast.  . losartan (COZAAR) 25 MG tablet TAKE 1 TABLET (25 MG TOTAL) BY MOUTH DAILY.  . nitroGLYCERIN (NITROSTAT) 0.4 MG SL tablet PLACE 1 TAB UNDER THE TONGUE  EVERY 5 MINUTES AS NEEDED FOR CHEST PAIN (SEVERE PRESSURE OR TIGHTNESS)  . omeprazole (PRILOSEC) 20 MG capsule TAKE 1 CAPSULE BY MOUTH EVERY DAY  . potassium chloride (MICRO-K) 10 MEQ CR capsule Take by mouth daily.  . rosuvastatin (CRESTOR) 20 MG tablet Take 20 mg by mouth daily.  . TRULICITY 1.5 WJ/1.9JY SOPN Inject 4.5 mg into the skin once a week.   Alveda Reasons 2.5 MG TABS tablet TAKE 1 TABLET BY MOUTH TWICE A DAY  . [DISCONTINUED] canagliflozin (INVOKANA) 100 MG TABS tablet 3 (three) times daily before meals.   . [DISCONTINUED] furosemide (LASIX) 80 MG tablet Take 160 mg by mouth 3 (three) times daily.  . [DISCONTINUED] Insulin Degludec (TRESIBA FLEXTOUCH) 200 UNIT/ML SOPN Inject 90 Units into the skin 2 (two) times a day.  . [DISCONTINUED] insulin regular human CONCENTRATED (HUMULIN R U-500 KWIKPEN) 500 UNIT/ML kwikpen Inject 65 Units into the skin 3 (three) times daily with meals.    Allergies  Allergen Reactions  . Septra [Bactrim] Itching  . Penicillins Hives    Allergy to All cillin drugs  Ancef given 9/24 with no obvious reaction  . Clopidogrel     NON RESPONDER  P2Y12  . Metformin And Related     SOCIAL HISTORY/FAMILY HISTORY   Reviewed in Epic:  Pertinent findings: --> No longer working-retired/fired as of September 2020.  Basically reached and handle agreement that he  would stop working in lieu of being fired after being berated by a client and plan for things that he had not done. No longer working has made him much less stressed, and it seems to be healthier experience.  OBJCTIVE -PE, EKG, labs   Wt Readings from Last 3 Encounters:  06/14/19 (!) 320 lb 9.6 oz (145.4 kg)  05/11/19 (!) 318 lb (144.2 kg)  04/26/19 (!) 319 lb (144.7 kg)    Physical Exam: BP 130/74   Pulse 64   Temp (!) 97.2 F (36.2 C)   Ht _0  (1.854 m)   Wt (!) 320 lb 9.6 oz (145.4 kg)   SpO2 98%   BMI 42.30 kg/m  Physical Exam  Constitutional: He is oriented to person, place, and time. He appears well-developed. No distress.  Well-groomed.  Stable morbid obesity  HENT:  Head: Normocephalic and atraumatic.  Neck: No JVD present.  Cardiovascular: Normal rate, regular rhythm, normal heart sounds and intact distal pulses. Exam reveals no gallop and no friction rub.  No murmur heard. Unable to assess PMI.  No ectopy.  Distant heart sounds but normal  Pulmonary/Chest: Effort normal. No respiratory distress. He has no wheezes. He has no rales.  Distant but clear breath sounds  Abdominal: Bowel sounds are normal. He exhibits no distension (Does seem more protuberant but not with signs of fluid overload.). There is no rebound.  Morbidly obese protuberant abdomen.  Bilateral sides of his abdomen have tenderness  Musculoskeletal:        General: Edema (Trivial with varicose veins noted but able to see the bony definition of his ankle and knees.) present. Normal range of motion.     Cervical back: Normal range of motion and neck supple.  Neurological: He is alert and oriented to person, place, and time.  Psychiatric: He has a normal mood and affect. His behavior is normal. Judgment and thought content normal.  He is in great spirits.  This is the happiest of seen him in years.  Seems free of stress.  Vitals reviewed.   Adult ECG  Report  Rate: 64 ;  Rhythm: normal sinus rhythm and  Normal axis and intervals durations.  Subtle anterolateral ST and T wave changes with mild T wave inversions.  Cannot exclude ischemia.;   Narrative Interpretation: T wave abnormality seems to be slightly more prominent but not significant.  Recent Labs: Apparently has just had labs checked by Dr. Shelia Media -not yet recorded. Per his report, the labs look stable. 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  Interestingly, lipids seem to be doing relatively well without the addition of Vascepa and no longer on fenofibrate because of medication reaction. This may be in part related to the fact that he is on combination of Jardiance and Trulicity (SGLT2 inhibitor as well as GLP-1 agonist) .  Also on sliding scale insulin.      Relevant Medications   insulin regular human CONCENTRATED (HUMULIN R U-500 KWIKPEN) 500 UNIT/ML kwikpen   furosemide (LASIX) 80 MG tablet   CAD, LAD/CFX DES 04/28/12- staged RCA DES 04/29/12 after pt declined CABG, cath 05/14/13 stable CAD medical therapy - Primary (Chronic)    No further chest pain episodes.  Doing well.  Needs to be a little more active with exercise, we talked about that. Otherwise, well controlled on current dose of carvedilol and ARB.  Would closely monitor blood pressures to ensure that the dose of ARB does not require titration Is now back on rosuvastatin and tolerating well.  Plan:   Continue 81 mg aspirin plus low-dose  Xarelto (2.5 mg twice daily) for CAD-PAD secondary prevention.  Okay to hold Xarelto 2 to 3 days preop for procedures or surgeries.  Continue ARB and beta-blocker at current doses with low threshold to titrate ARB further as necessary for blood pressure control.  On Jardiance plus Trulicity both of which have proven cardiovascular benefit      Relevant Medications   furosemide (LASIX) 80 MG tablet   Other Relevant Orders   EKG 12-Lead (Completed)   Chronic diastolic heart failure, NYHA class 2 - PCWPf 22 mmHg during catheterization. (Chronic)    Likely does have diastolic dysfunction from longstanding hypertension and CAD.  Dyspnea also related to obesity with OSA-OHS as well as deconditioning.  Overall improving with weight loss. Is on losartan and carvedilol along with pretty high-dose Lasix that is being titrated by his nephrologist. -->  As blood pressure tolerates, may need to increase ARB dose but will defer to nephrology.  Also has diuretic effect of Jardiance.      Relevant Medications   furosemide (LASIX) 80 MG tablet   Other Relevant Orders   EKG 12-Lead (Completed)   Essential hypertension (Chronic)    Blood pressure looks pretty well controlled today, but has been high in the past.  We did restart losartan last year along with his carvedilol.  He is being monitored by his PCP and  nephrologist.  Low threshold to titrate ARB further.      Relevant Medications   furosemide (LASIX) 80 MG tablet   Other Relevant Orders   EKG 12-Lead (Completed)   Morbid (severe) obesity due to excess calories (HCC) (Chronic)    He is a little sheepish about the fact that he is actually gained weight during the COVID-19 pandemic and his recent retirement.  He acknowledges that he needs to do a better job with diet and exercise, and is making efforts to continue with adjusted diet and is looking into doing water aerobics or walking.  He just has self-conscious issues with going to the gym.       Relevant Medications   insulin regular human CONCENTRATED (HUMULIN R U-500 KWIKPEN) 500 UNIT/ML kwikpen   Atherosclerosis of native artery of both lower extremities with intermittent claudication (HCC)    No active claudication symptoms. Continue aggressive cardiovascular risk factor modification.      Relevant Medications   furosemide (LASIX) 80 MG tablet       COVID-19 Education: The signs and symptoms of COVID-19 were discussed with the patient and how to seek care for testing (follow up with PCP or arrange E-visit).   The importance of social distancing and COVID-19 vaccination was discussed today.  I spent a total of 20 minutes with the patient. >  50% of the time was spent in direct patient consultation.  Additional time spent with chart review  / charting (studies, outside notes, etc): 8 Total Time: 28 min   Current medicines are reviewed at length with the patient today.  (+/- concerns) none  Notice: This dictation was prepared with Dragon dictation along with smaller phrase technology. Any transcriptional errors that result from this process are unintentional and may not be corrected upon review.  Patient Instructions / Medication Changes & Studies & Tests Ordered   Patient Instructions  Medication Instructions:  NO CHANGES  *If you need a refill on your cardiac medications before your next appointment, please call your pharmacy*   Lab Work: NOT NEEDED   Testing/Procedures: NOT NEEDED   Follow-Up: At [facility], you and your health needs are our priority.  As part of our continuing mission to provide you with exceptional heart care, we have created designated Provider Care Teams.  These Care Teams include your primary Cardiologist (physician) and Advanced Practice Providers (APPs -  Physician Assistants and Nurse Practitioners) who all work together to provide you with the care you need, when you need it.   Your next appointment:   12 month(s)  The  format for your next appointment:   In Person  Provider:   Glenetta Hew, MD   Other Instructions N/A    Studies Ordered:   Orders Placed This Encounter  Procedures  . EKG 12-Lead     Glenetta Hew, M.D., M.S. Interventional Cardiologist   Pager # [phone] Phone # [phone] [address] [place], [place] [address]   Thank you for choosing [facility] at [facility]!!

## 2019-06-19 ENCOUNTER — Encounter: Payer: Self-pay | Admitting: Cardiology

## 2019-06-19 NOTE — Assessment & Plan Note (Signed)
He is a little sheepish about the fact that he is actually gained weight during the COVID-19 pandemic and his recent retirement.  He acknowledges that he needs to do a better job with diet and exercise, and is making efforts to continue with adjusted diet and is looking into doing water aerobics or walking.  He just has self-conscious issues with going to the gym.

## 2019-06-19 NOTE — Assessment & Plan Note (Signed)
No further chest pain episodes.  Doing well.  Needs to be a little more active with exercise, we talked about that. Otherwise, well controlled on current dose of carvedilol and ARB.  Would closely monitor blood pressures to ensure that the dose of ARB does not require titration Is now back on rosuvastatin and tolerating well.  Plan:   Continue 81 mg aspirin plus low-dose Xarelto (2.5 mg twice daily) for CAD-PAD secondary prevention.  Okay to hold Xarelto 2 to 3 days preop for procedures or surgeries.  Continue ARB and beta-blocker at current doses with low threshold to titrate ARB further as necessary for blood pressure control.  On Jardiance plus Trulicity both of which have proven cardiovascular benefit

## 2019-06-19 NOTE — Assessment & Plan Note (Signed)
Likely does have diastolic dysfunction from longstanding hypertension and CAD.  Dyspnea also related to obesity with OSA-OHS as well as deconditioning.  Overall improving with weight loss. Is on losartan and carvedilol along with pretty high-dose Lasix that is being titrated by his nephrologist. -->  As blood pressure tolerates, may need to increase ARB dose but will defer to nephrology.  Also has diuretic effect of Jardiance.

## 2019-06-19 NOTE — Assessment & Plan Note (Signed)
No active claudication symptoms. Continue aggressive cardiovascular risk factor modification.

## 2019-06-19 NOTE — Assessment & Plan Note (Signed)
Blood pressure looks pretty well controlled today, but has been high in the past.  We did restart losartan last year along with his carvedilol.  He is being monitored by his PCP and nephrologist.  Low threshold to titrate ARB further.

## 2019-06-19 NOTE — Assessment & Plan Note (Signed)
He is tolerating rosuvastatin.  Interestingly, lipids seem to be doing relatively well without the addition of Vascepa and no longer on fenofibrate because of medication reaction. This may be in part related to the fact that he is on combination of Jardiance and Trulicity (SGLT2 inhibitor as well as GLP-1 agonist) .  Also on sliding scale insulin.

## 2019-06-21 DIAGNOSIS — R0781 Pleurodynia: Secondary | ICD-10-CM | POA: Diagnosis not present

## 2019-06-21 DIAGNOSIS — K580 Irritable bowel syndrome with diarrhea: Secondary | ICD-10-CM | POA: Diagnosis not present

## 2019-07-06 DIAGNOSIS — R1010 Upper abdominal pain, unspecified: Secondary | ICD-10-CM | POA: Diagnosis not present

## 2019-07-06 DIAGNOSIS — E1165 Type 2 diabetes mellitus with hyperglycemia: Secondary | ICD-10-CM | POA: Diagnosis not present

## 2019-07-06 DIAGNOSIS — I10 Essential (primary) hypertension: Secondary | ICD-10-CM | POA: Diagnosis not present

## 2019-07-06 DIAGNOSIS — E785 Hyperlipidemia, unspecified: Secondary | ICD-10-CM | POA: Diagnosis not present

## 2019-07-06 DIAGNOSIS — Z794 Long term (current) use of insulin: Secondary | ICD-10-CM | POA: Diagnosis not present

## 2019-07-13 DIAGNOSIS — M25559 Pain in unspecified hip: Secondary | ICD-10-CM | POA: Diagnosis not present

## 2019-07-13 DIAGNOSIS — M109 Gout, unspecified: Secondary | ICD-10-CM | POA: Diagnosis not present

## 2019-07-13 DIAGNOSIS — M25561 Pain in right knee: Secondary | ICD-10-CM | POA: Diagnosis not present

## 2019-07-13 DIAGNOSIS — G4733 Obstructive sleep apnea (adult) (pediatric): Secondary | ICD-10-CM | POA: Diagnosis not present

## 2019-07-13 DIAGNOSIS — Z79899 Other long term (current) drug therapy: Secondary | ICD-10-CM | POA: Diagnosis not present

## 2019-07-13 DIAGNOSIS — M1990 Unspecified osteoarthritis, unspecified site: Secondary | ICD-10-CM | POA: Diagnosis not present

## 2019-07-15 DIAGNOSIS — M21622 Bunionette of left foot: Secondary | ICD-10-CM | POA: Diagnosis not present

## 2019-07-15 DIAGNOSIS — M21621 Bunionette of right foot: Secondary | ICD-10-CM | POA: Diagnosis not present

## 2019-07-15 DIAGNOSIS — M21612 Bunion of left foot: Secondary | ICD-10-CM | POA: Diagnosis not present

## 2019-07-15 DIAGNOSIS — E1351 Other specified diabetes mellitus with diabetic peripheral angiopathy without gangrene: Secondary | ICD-10-CM | POA: Diagnosis not present

## 2019-07-15 DIAGNOSIS — L84 Corns and callosities: Secondary | ICD-10-CM | POA: Diagnosis not present

## 2019-07-15 DIAGNOSIS — L602 Onychogryphosis: Secondary | ICD-10-CM | POA: Diagnosis not present

## 2019-07-20 DIAGNOSIS — I739 Peripheral vascular disease, unspecified: Secondary | ICD-10-CM | POA: Diagnosis not present

## 2019-07-26 DIAGNOSIS — K580 Irritable bowel syndrome with diarrhea: Secondary | ICD-10-CM | POA: Diagnosis not present

## 2019-07-26 DIAGNOSIS — R1011 Right upper quadrant pain: Secondary | ICD-10-CM | POA: Diagnosis not present

## 2019-07-29 DIAGNOSIS — I739 Peripheral vascular disease, unspecified: Secondary | ICD-10-CM | POA: Diagnosis not present

## 2019-07-29 DIAGNOSIS — I70203 Unspecified atherosclerosis of native arteries of extremities, bilateral legs: Secondary | ICD-10-CM | POA: Diagnosis not present

## 2019-07-29 DIAGNOSIS — I70223 Atherosclerosis of native arteries of extremities with rest pain, bilateral legs: Secondary | ICD-10-CM | POA: Diagnosis not present

## 2019-08-10 DIAGNOSIS — R208 Other disturbances of skin sensation: Secondary | ICD-10-CM | POA: Diagnosis not present

## 2019-08-10 DIAGNOSIS — E1140 Type 2 diabetes mellitus with diabetic neuropathy, unspecified: Secondary | ICD-10-CM | POA: Diagnosis not present

## 2019-08-10 DIAGNOSIS — I739 Peripheral vascular disease, unspecified: Secondary | ICD-10-CM | POA: Diagnosis not present

## 2019-08-10 DIAGNOSIS — I10 Essential (primary) hypertension: Secondary | ICD-10-CM | POA: Diagnosis not present

## 2019-08-12 ENCOUNTER — Other Ambulatory Visit: Payer: Self-pay | Admitting: Cardiology

## 2019-08-17 ENCOUNTER — Encounter: Payer: Self-pay | Admitting: Cardiology

## 2019-08-17 ENCOUNTER — Other Ambulatory Visit: Payer: Self-pay

## 2019-08-17 ENCOUNTER — Telehealth: Payer: Self-pay | Admitting: Cardiology

## 2019-08-17 ENCOUNTER — Ambulatory Visit: Payer: Medicare Other | Admitting: Cardiology

## 2019-08-17 VITALS — BP 138/72 | HR 64 | Ht 73.0 in | Wt 327.0 lb

## 2019-08-17 DIAGNOSIS — R809 Proteinuria, unspecified: Secondary | ICD-10-CM | POA: Diagnosis not present

## 2019-08-17 DIAGNOSIS — E039 Hypothyroidism, unspecified: Secondary | ICD-10-CM | POA: Diagnosis not present

## 2019-08-17 DIAGNOSIS — E1149 Type 2 diabetes mellitus with other diabetic neurological complication: Secondary | ICD-10-CM

## 2019-08-17 DIAGNOSIS — E1165 Type 2 diabetes mellitus with hyperglycemia: Secondary | ICD-10-CM | POA: Diagnosis not present

## 2019-08-17 DIAGNOSIS — I6522 Occlusion and stenosis of left carotid artery: Secondary | ICD-10-CM

## 2019-08-17 DIAGNOSIS — Z794 Long term (current) use of insulin: Secondary | ICD-10-CM

## 2019-08-17 DIAGNOSIS — E1169 Type 2 diabetes mellitus with other specified complication: Secondary | ICD-10-CM

## 2019-08-17 DIAGNOSIS — I70213 Atherosclerosis of native arteries of extremities with intermittent claudication, bilateral legs: Secondary | ICD-10-CM

## 2019-08-17 DIAGNOSIS — Z87891 Personal history of nicotine dependence: Secondary | ICD-10-CM

## 2019-08-17 DIAGNOSIS — I2510 Atherosclerotic heart disease of native coronary artery without angina pectoris: Secondary | ICD-10-CM | POA: Diagnosis not present

## 2019-08-17 DIAGNOSIS — G629 Polyneuropathy, unspecified: Secondary | ICD-10-CM

## 2019-08-17 DIAGNOSIS — Z9889 Other specified postprocedural states: Secondary | ICD-10-CM

## 2019-08-17 DIAGNOSIS — I739 Peripheral vascular disease, unspecified: Secondary | ICD-10-CM | POA: Diagnosis not present

## 2019-08-17 DIAGNOSIS — R6889 Other general symptoms and signs: Secondary | ICD-10-CM

## 2019-08-17 DIAGNOSIS — I10 Essential (primary) hypertension: Secondary | ICD-10-CM

## 2019-08-17 DIAGNOSIS — Z9861 Coronary angioplasty status: Secondary | ICD-10-CM

## 2019-08-17 DIAGNOSIS — E1122 Type 2 diabetes mellitus with diabetic chronic kidney disease: Secondary | ICD-10-CM

## 2019-08-17 NOTE — Telephone Encounter (Signed)
New Message:    Pt called and said he is having problems with his lower legs. He is seeing Dr Terri Skains with [facility]. Dr Terri Skains told him to call Dr Ellyn Hack and asked him if he wants Dr Terri Skains to continue seeing  him for his lower legs or does Dr Ellyn Hack want to follow up with him?

## 2019-08-17 NOTE — Telephone Encounter (Signed)
Left message to call back  

## 2019-08-17 NOTE — Telephone Encounter (Signed)
CALLED LEFT MESSAGE  WILL DISCUSS WITH DR HARDING. WILL CONTACT  PATIENT

## 2019-08-17 NOTE — Telephone Encounter (Signed)
Follow Up  Patient is returning call. Please give patient a call back.

## 2019-08-17 NOTE — Progress Notes (Signed)
Date:  08/17/2019   ID:  Harold [name], DOB 05-01-1955, MRN [id number]  PCP:  Deland Pretty, MD  Primary Cardiologist: Dr. Ellyn Hack.  Vascular Surgery: Dr. Oneida Alar.   REASON FOR CONSULT: Peripheral vascular disease evaluation (second evaluation).    REQUESTING PHYSICIAN:  Deland Pretty, MD [address] [place],  [place] [address]  Chief Complaint  Patient presents with  . vasular    pt says he has blockages in his legs   . New Patient (Initial Visit)    HPI  Harold [name] is a 64 y.o. male who presents to the office with a chief complaint of "second opinion regarding angioplasty for legs." He is referred to the office at the request of Deland Pretty, MD. Patient's past medical history and cardiovascular risk factors include: Hypertension, hyperlipidemia, insulin-dependent diabetes mellitus type 2, OSA, peripheral vascular disease, established coronary artery disease (status post angioplasty/PCIs), carotid stenosis status post right endarterectomy (2005), myocardial infarction, chronic kidney disease, advanced age, obesity due to excess calories.  Patient is referred to the office at the request of his primary care provider for evaluation of claudication and possible abdominal aortic runoff with peripheral intervention.  Patient states that he was recently being evaluated by his podiatrist for possible left bunion surgery.  Prior to surgery he underwent ankle-brachial index which were noted to be abnormal and underwent arterial duplex.  He was notified that he has blockages in both of the lower extremities which will need peripheral intervention.  He was referred to a group in [facility].  However, patient thought this was too far for procedure that most likely could be done within the community.  As result he was referred to our office for further evaluation and management.  Symptomatically patient states that he has claudication in bilateral lower  extremities with minimal walking up to 10 to 15 feet.  Patient describes the pain as " legs will fall out."  The pain usually improves after resting.  He has pain bilaterally.  However segmentally patient states that his thigh pain is greater on the left than the right and infrapopliteal pain is equal bilaterally.  Patient is currently on aspirin, low-dose Xarelto, and statin therapy.  Interestingly, patient states that he follows up with Dr. Ellyn Hack at [facility] for his cardiovascular evaluation and management.  In regards a professional courtesy I asked if he has discussed his claudication symptoms with Dr. Ellyn Hack.  Patient states that he has not.  Had a detailed discussion with the patient in regards to professional courtesy and that he should discuss it with Dr. Ellyn Hack as he may have other providers within [facility] who could help address his peripheral vascular disease.  Patient finds is very acceptable he states that he will call Dr. Ellyn Hack later today.  During the office visit we did also request records for the ABIs and arterial duplex studies that were done at his podiatrist office.  Per report ankle-brachial index on the right was 0.85 and left NA (unable to get readings of the left ankle).  Per report he has severe obstruction of the femoral and popliteal as well as tibial arteries bilaterally.  FUNCTIONAL STATUS: No structured ercise program or daily routine.    ALLERGIES: Allergies  Allergen Reactions  . Septra [Bactrim] Itching  . Penicillins Hives    Allergy to All cillin drugs  Ancef given 9/24 with no obvious reaction  . Clopidogrel     NON RESPONDER  P2Y12  . Metformin And Related  MEDICATION LIST PRIOR TO VISIT: Current Meds  Medication Sig  . allopurinol (ZYLOPRIM) 100 MG tablet Take 100 mg by mouth daily.  Marland Kitchen aspirin EC 81 MG tablet Take 1 tablet (81 mg total) by mouth daily.  . carvedilol (COREG) 6.25 MG tablet TAKE 1 TABLET (6.25 MG TOTAL) BY MOUTH 2 (TWO) TIMES DAILY  WITH A MEAL.  Marland Kitchen COLCRYS 0.6 MG tablet Take 0.6 mg by mouth daily.  . Continuous Blood Gluc Receiver (FREESTYLE LIBRE 14 DAY READER) DEVI FreeStyle Libre 14 Day Reader  USE AS DIRECTED  . Continuous Blood Gluc Sensor (FREESTYLE LIBRE 14 DAY SENSOR) MISC APPLY 1 SENSOR EVERY 14 DAYS  . empagliflozin (JARDIANCE) 25 MG TABS tablet Jardiance 25 mg tablet  TAKE 1 TABLET BY MOUTH EVERY DAY  . escitalopram (LEXAPRO) 20 MG tablet TAKE 1 TABLET (20 MG TOTAL) BY MOUTH DAILY.  . furosemide (LASIX) 80 MG tablet Take 80 mg by mouth 3 (three) times daily. TAKE 160 MG BY MOUTH 3 ( THREE) TIMES DAILY  . insulin regular human CONCENTRATED (HUMULIN R U-500 KWIKPEN) 500 UNIT/ML kwikpen Inject 110 Units into the skin 3 (three) times daily with meals.  Marland Kitchen levothyroxine (SYNTHROID, LEVOTHROID) 88 MCG tablet Take 88 mcg by mouth daily before breakfast.  . losartan (COZAAR) 25 MG tablet Take 1 tablet (25 mg total) by mouth daily.  . nitroGLYCERIN (NITROSTAT) 0.4 MG SL tablet PLACE 1 TAB UNDER THE TONGUE EVERY 5 MINUTES AS NEEDED FOR CHEST PAIN (SEVERE PRESSURE OR TIGHTNESS)  . omeprazole (PRILOSEC) 20 MG capsule TAKE 1 CAPSULE BY MOUTH EVERY DAY  . potassium chloride (MICRO-K) 10 MEQ CR capsule Take by mouth daily.  . rosuvastatin (CRESTOR) 20 MG tablet Take 10 mg by mouth daily.   Alveda Reasons 2.5 MG TABS tablet TAKE 1 TABLET BY MOUTH TWICE A DAY     PAST MEDICAL HISTORY: Past Medical History:  Diagnosis Date  . Anginal pain [facility]) March 2015   Cardiac cath showed patent stents with distal LAD, circumflex-OM and RCA disease in small vessels.  . Anxiety   . CAD S/P percutaneous coronary angioplasty 2003, 04/2012   status post PCI to LAD, circumflex-OM 2, RCA  . Carotid artery occlusion   . Chronic renal insufficiency, stage II (mild)   . Chronic venous insufficiency    varicosities, no reflux; dopplers 04/14/12- valvular insufficiency in the R and L GSV  . Complication of anesthesia   . COPD, mild ([place])   .  Depression    situaltional   . Diabetes mellitus   . Dyslipidemia associated with type 2 diabetes mellitus ([place])   . GERD (gastroesophageal reflux disease)   . HTN (hypertension)   . Hypothyroidism   . Neuromuscular disorder (HCC)    neuropathy in feet  . Neuropathy    notably improved following PCI with improved cardiac function  . Obesity   . 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 PONV (postoperative nausea and vomiting)    "[place]"  . Pulmonary hypertension ([place]) 04/2012   PA pressure 32mhg  . Sleep apnea    uses nightly    PAST SURGICAL HISTORY: Past Surgical History:  Procedure Laterality Date  . ABDOMINAL AORTAGRAM N/A 11/15/2011   Procedure: ABDOMINAL AMaxcine Ham  Surgeon: CElam Dutch MD;  [facility];  Service: Cardiovascular;  [name];  . ABIs  04/27/2012   mild bilateral arterial insufficiency  .  BACK SURGERY  2005 x1   X2-2010  . BUNIONECTOMY Right 11/11/2013   Procedure: RIGHT FOOT SILVER BUNIONECTOMY;  Surgeon: Wylene Simmer, MD;  Location: [place];  Service: Orthopedics;  Laterality: Right;  . CARDIAC CATHETERIZATION  12/11/2001   significant 3V CAD, normal LV function  . Carotid Doppler  04/27/2012   right internal carotid: Elevated velocities but no evidence of plaque. Left internal carotid 40-59%  . CAROTID ENDARTERECTOMY  2005   Right; recent carotid Dopplers notes elevated velocities.   . colonscopy    . CORONARY ANGIOPLASTY  12/21/2001   PTCA of the distal and mid AV groove circ, unsuccessful PTCA of second OM total occlusion, unsuccessful PTCA of the apical LAD total occlusion  . CORONARY ANGIOPLASTY WITH STENT PLACEMENT  04/29/12   PCI to 3 RCA lesions, Promus Premiere 2.44mx8mm distally, mid was 2.575m8mm and proximally 2.75x1687mEF 55-60%  . CORONARY STENT PLACEMENT  04/28/12   PCI to LAD (3x23m27mence DES postdilated to  3.25) and circ prox and mid (2 overlappinmg 2.5mmx72mmXience DES postdilated to 2.75mm)42mDOPPLER ECHOCARDIOGRAPHY  04/28/2012   poor quality study: EF estimated 60-65%; unable to assess diastolic function (previously noted to have diastolic dysfunction); severely dilated left atrium and mild right atrium; dilated IVC consistent with increased central venous pressure..  . HERNIA REPAIR    . LEFT AND RIGHT HEART CATHETERIZATION WITH CORONARY ANGIOGRAM N/A 04/27/2012   Procedure: LEFT AND RIGHT HEART CATHETERIZATION WITH CORONARY ANGIOGRAM;  Surgeon: David Leonie Man [facility];  Service: Cardiovascular;  [name];  . LEFT AND RIGHT HEART CATHETERIZATION WITH CORONARY ANGIOGRAM N/A 05/14/2013   Procedure: LEFT AND RIGHT HEART CATHETERIZATION WITH CORONARY ANGIOGRAM;  Surgeon: ThomasTroy Sine [facility]: Moderate Pulm HTN: 46/16 - mean 33 mmHg; PCWP 22mmHg32[name], [name], Patent RCA ostial mid and distal stents - 70% distal runoff Dz  . LUMBAR LAMINECTOMY/DECOMPRESSION MICRODISCECTOMY  01/07/2012   Procedure: LUMBAR LAMINECTOMY/DECOMPRESSION MICRODISCECTOMY 1 LEVEL;  Surgeon: Kyle L Winfield CunasLocation: [facility];  Service: Neurosurgery;  Laterality: Bilateral;   Lumbar Three-Four Decompression  . PERCUTANEOUS CORONARY STENT INTERVENTION (PCI-S) N/A 04/28/2012   Procedure: PERCUTANEOUS CORONARY STENT INTERVENTION (PCI-S);  Surgeon: Thomas Troy Sine[facility];  Service: Cardiovascular;  [name];  . PERCUTANEOUS CORONARY STENT INTERVENTION (PCI-S) N/A 04/29/2012   Procedure: PERCUTANEOUS CORONARY STENT INTERVENTION (PCI-S);  Surgeon: David WLeonie ManLocation[facility];  Service: Cardiovascular;  [name];  . SPINE SURGERY    . UMBILICAL HERNIA REPAIR  2009   steel mesh insert    FAMILY HISTORY: The patient He was adopted.  Family history is unknown by patient.  SOCIAL HISTORY:  The patient  reports that he quit smoking about 16 years ago. His smoking use included cigarettes. He has a 42.00 pack-year smoking history. He has never used smokeless tobacco. He reports that he does not drink alcohol and does not use drugs.  REVIEW OF SYSTEMS: Review of Systems  Constitutional: Negative for chills and fever.  HENT: Negative for hoarse voice and nosebleeds.   Eyes: Negative for discharge, double vision and pain.  Cardiovascular: Positive for claudication. Negative for chest pain, dyspnea on exertion, leg swelling, near-syncope, orthopnea, palpitations, paroxysmal nocturnal dyspnea and syncope.  Respiratory: Negative for hemoptysis and shortness of breath.  Musculoskeletal: Negative for muscle cramps and myalgias.  Gastrointestinal: Negative for abdominal pain, constipation, diarrhea, hematemesis, hematochezia, melena, nausea and vomiting.  Neurological: Negative for dizziness and light-headedness.    PHYSICAL EXAM: Vitals with BMI 08/17/2019 06/14/2019 05/11/2019  Height 6' 1" 6' 1" 6' 1"  Weight 327 lbs 320 lbs 10 oz 318 lbs  BMI 43.15 04.54 09.81  Systolic 191 478 295  Diastolic 72 74 73  Pulse 64 64 64    CONSTITUTIONAL: Well-developed and well-nourished. No acute distress.  SKIN: Skin is warm and dry. No rash noted. No cyanosis. No pallor. No jaundice HEAD: Normocephalic and atraumatic.  EYES: No scleral icterus MOUTH/THROAT: Moist oral membranes.  NECK: No JVD present. No thyromegaly noted. Left carotid bruit. Right CEA side is well healed.  LYMPHATIC: No visible cervical adenopathy.  CHEST Normal respiratory effort. No intercostal retractions  LUNGS: Clear to auscultation bilaterally. No stridor. No wheezes. No rales.  CARDIOVASCULAR: Regular rate and rhythm, positive S1-S2, no murmurs rubs or gallops appreciated. ABDOMINAL: Obese, soft, nontender, nondistended, positive bowel sounds all 4 quadrants.  No apparent ascites.  EXTREMITIES: +1 bilateral peripheral edema, decreased pulses +1 bilateral femoral, popliteal, DP and PT.   HEMATOLOGIC: No significant bruising NEUROLOGIC: Oriented to person, place, and time. Nonfocal. Normal muscle tone.  PSYCHIATRIC: Normal mood and affect. Normal behavior. Cooperative  CARDIAC DATABASE: EKG: 08/17/2019: Normal sinus rhythm, 61 bpm, possible old inferior infarct, nonspecific T wave abnormality.  Echocardiogram: None in past 5 years.   Stress Testing: None in past 5 years.   Heart Catheterization: 05/13/2013: Left heart catheterization by Dr. Candee Furbish.  Please see EMR for additional details.  Based on the diagram provided patient has PCIs in the RCA, LCx and LAD distribution.  Carotid duplex: 04/26/2019: Right Carotid: Velocities in the right ICA are consistent with a 1-39% stenosis. Patent CEA. Left Carotid: Velocities in the left ICA are consistent with a 40-59% stenosis. The ECA appears >50% stenosed. Unable to obtain higher ICA end diastolic velocity as noted on previous exam. Calcific plaque may obscure higher velocities. Vertebrals: Bilateral vertebral arteries demonstrate antegrade flow. Subclavians: Normal flow hemodynamics were seen in bilateral subclavian arteries.  LABORATORY DATA: External Labs: Collected: May 24, 2019 Hemoglobin: 16.4 g/dL Creatinine 1.54 mg/dL. eGFR: 47 mL/min per 1.73 m Lipid profile: Total cholesterol 215, triglycerides 172, HDL 70, non-HDL 115.   Hemoglobin A1c: 8   IMPRESSION:    ICD-10-CM   1. Claudication in peripheral vascular disease (HCC)  I73.9   2. Essential hypertension  I10 EKG 12-Lead  3. Abnormal ankle brachial index (ABI)  R68.89   4. Peripheral artery disease (HCC)  I73.9   5. Atherosclerosis of native artery of both lower extremities with intermittent claudication ([place])  I70.213   6. Type 2 diabetes mellitus with hyperglycemia, with long-term current use of insulin (HCC)  E11.65      Z79.4   7. Type 2 diabetes mellitus with stage 3a chronic kidney disease, with long-term current use of insulin (HCC)  E11.21    N18.31    Z79.4   8. Long-term insulin use (HCC)  Z79.4   9. CAD, LAD/CFX DES 04/28/12- staged RCA DES 04/29/12 after pt declined CABG, cath 05/14/13 stable CAD medical therapy  I25.10    Z98.61   10. Combined hyperlipidemia associated with type 2 diabetes mellitus and obesity  E11.69    E78.2   11. Stenosis of left carotid artery  I65.22   12. History of CEA (carotid endarterectomy)  A21.308  28. Former smoker  Z87.891   14. Class 3 severe obesity due to excess calories with serious comorbidity and body mass index (BMI) of 40.0 to 44.9 in adult [facility])  E66.01    Z68.41      RECOMMENDATIONS: YANUEL TAGG is a 64 y.o. male whose past medical history and cardiac risk factors include: Hypertension, hyperlipidemia, insulin-dependent diabetes mellitus type 2, OSA, peripheral vascular disease, established coronary artery disease (status post angioplasty/PCIs), carotid stenosis status post right endarterectomy (2005), myocardial infarction, chronic kidney disease, advanced age, obesity due to excess calories.  Claudication:  Patient has claudication in bilateral lower extremities as described above.  Ankle-brachial indexes were reported to be abnormal and per arterial duplex he has stenosis within bilateral femoral, popliteal, and tibial arteries.  On physical examination he has diminished pulses bilaterally.  Continue aspirin, low-dose Xarelto, and statin therapy.  Agree with the patient that he does not necessarily have to go to [place], [place] to tx his PAD.  Given the fact the patient sees Dr. Ellyn Hack on a regular basis the last office visit April 2021 I have asked him to discuss this with his primary cardiologist as he may be able to see someone within the same group for continuity of care.  Patient would benefit from peripheral angiography with  possible intervention.  Educated him on the importance of medication compliance and improving his modifiable cardiovascular risk factors.  Recommend increasing statin therapy if tolerable.  Consider addition of fenofibrate or Vascepa as his triglycerides are not at goal.  I will see him back in 2 weeks.   Symptomatic peripheral artery disease: See above  Bilateral carotid artery disease:  History of right carotid endarterectomy.   Left carotid bruit noted on physical examination.  Last carotid duplex results reviewed.  Currently on aspirin and statin therapy.  Follows up with Dr. Oneida Alar  Established coronary artery disease without angina pectoris with multiple PCI's: Follows up with Dr. Ellyn Hack.   Insulin-dependent diabetes mellitus type 2: Most recent hemoglobin A1c not at goal.  Patient educated on the importance of glycemic control given his atherosclerotic disease in multiple vascular meds.  Currently managed by primary and endocrinology.  Total time spent: 60 minutes.  FINAL MEDICATION LIST END OF ENCOUNTER: No orders of the defined types were placed in this encounter.   There are no discontinued medications.   Current Outpatient Medications:  .  allopurinol (ZYLOPRIM) 100 MG tablet, Take 100 mg by mouth daily., Disp: , Rfl: 1 .  aspirin EC 81 MG tablet, Take 1 tablet (81 mg total) by mouth daily., Disp: 90 tablet, Rfl: 3 .  carvedilol (COREG) 6.25 MG tablet, TAKE 1 TABLET (6.25 MG TOTAL) BY MOUTH 2 (TWO) TIMES DAILY WITH A MEAL., Disp: 180 tablet, Rfl: 2 .  COLCRYS 0.6 MG tablet, Take 0.6 mg by mouth daily., Disp: , Rfl:  .  Continuous Blood Gluc Receiver (FREESTYLE LIBRE 14 DAY READER) DEVI, FreeStyle Libre 14 Day Reader  USE AS DIRECTED, Disp: , Rfl:  .  Continuous Blood Gluc Sensor (FREESTYLE LIBRE 14 DAY SENSOR) MISC, APPLY 1 SENSOR EVERY 14 DAYS, Disp: , Rfl: 11 .  empagliflozin (JARDIANCE) 25 MG TABS tablet, Jardiance 25 mg tablet  TAKE 1 TABLET BY MOUTH EVERY  DAY, Disp: , Rfl:  .  escitalopram (LEXAPRO) 20 MG tablet, TAKE 1 TABLET (20 MG TOTAL) BY MOUTH DAILY., Disp: 30 tablet, Rfl: 1 .  furosemide (LASIX) 80 MG tablet, Take 80 mg by mouth 3 (three) times  daily. TAKE 160 MG BY MOUTH 3 ( THREE) TIMES DAILY, Disp: , Rfl:  .  insulin regular human CONCENTRATED (HUMULIN R U-500 KWIKPEN) 500 UNIT/ML kwikpen, Inject 110 Units into the skin 3 (three) times daily with meals., Disp: , Rfl:  .  levothyroxine (SYNTHROID, LEVOTHROID) 88 MCG tablet, Take 88 mcg by mouth daily before breakfast., Disp: , Rfl:  .  losartan (COZAAR) 25 MG tablet, Take 1 tablet (25 mg total) by mouth daily., Disp: 90 tablet, Rfl: 2 .  nitroGLYCERIN (NITROSTAT) 0.4 MG SL tablet, PLACE 1 TAB UNDER THE TONGUE EVERY 5 MINUTES AS NEEDED FOR CHEST PAIN (SEVERE PRESSURE OR TIGHTNESS), Disp: 25 tablet, Rfl: 3 .  omeprazole (PRILOSEC) 20 MG capsule, TAKE 1 CAPSULE BY MOUTH EVERY DAY, Disp: 90 capsule, Rfl: 3 .  potassium chloride (MICRO-K) 10 MEQ CR capsule, Take by mouth daily., Disp: , Rfl: 5 .  rosuvastatin (CRESTOR) 20 MG tablet, Take 10 mg by mouth daily. , Disp: , Rfl:  .  XARELTO 2.5 MG TABS tablet, TAKE 1 TABLET BY MOUTH TWICE A DAY, Disp: 180 tablet, Rfl: 3 .  gabapentin (NEURONTIN) 300 MG capsule, Take 600 mg by mouth in the morning, at noon, and at bedtime., Disp: , Rfl:  .  TRULICITY 1.5 DU/3.7DH SOPN, Inject 4.5 mg into the skin once a week.  (Patient not taking: Reported on 08/17/2019), Disp: , Rfl: 5  Orders Placed This Encounter  Procedures  . EKG 12-Lead    There are no Patient Instructions on file for this visit.   --Continue cardiac medications as reconciled in final medication list. --Return in about 2 weeks (around 08/31/2019) for discuss PAD evaluation . Or sooner if needed. --Continue follow-up with your primary care physician regarding the management of your other chronic comorbid conditions.  Patient's questions and concerns were addressed to his satisfaction. He  voices understanding of the instructions provided during this encounter.   This note was created using a voice recognition software as a result there may be grammatical errors inadvertently enclosed that do not reflect the nature of this encounter. Every attempt is made to correct such errors.  Rex Kras, [place], [facility]  Pager: [phone] Office: [phone]

## 2019-08-20 NOTE — Telephone Encounter (Signed)
Spoke to patient .  Per  Dr Ellyn Hack schedule lower  Extremites. Dopplers and abi and then follow up with DR Fletcher Anon.  patient also request a referral for  Foot doctor - (  Toenail , diabetic foot care)  PATIENT aware and verbalized understanding.

## 2019-08-20 NOTE — Telephone Encounter (Signed)
Patient returning call.

## 2019-08-20 NOTE — Telephone Encounter (Signed)
I saw the forwarded note by Dr.Tolia --> I am not exactly sure why his primary provider sent him elsewhere for this evaluation.  I think to save time it would make sense that check lower extremity arterial Dopplers/ABIs as the first step.  We can then have him follow-up with Dr. Fletcher Anon if necessary for peripheral vascular issues.  Dr. Terri Skains graciously acknowledge that the patient already has a cardiologist and wanted to make sure that we are able to follow-up with his concerns.  I am happy to see him, but I think we should go ahead and get the Dopplers/ABIs ordered first with symptoms concerning for claudication/PAD   Glenetta Hew, MD

## 2019-08-20 NOTE — Telephone Encounter (Signed)
LEFT MESSAGE - INSTRUCTION ORDER TEST THEN FOLLOW UP DR ARIDA --  PER DR HARDING.

## 2019-08-24 ENCOUNTER — Encounter: Payer: Self-pay | Admitting: Neurology

## 2019-08-24 ENCOUNTER — Telehealth: Payer: Self-pay | Admitting: Neurology

## 2019-08-24 ENCOUNTER — Ambulatory Visit: Payer: Medicare Other | Admitting: Neurology

## 2019-08-24 VITALS — BP 139/72 | HR 64 | Ht 73.0 in | Wt 327.5 lb

## 2019-08-24 DIAGNOSIS — R809 Proteinuria, unspecified: Secondary | ICD-10-CM | POA: Diagnosis not present

## 2019-08-24 DIAGNOSIS — M109 Gout, unspecified: Secondary | ICD-10-CM | POA: Diagnosis not present

## 2019-08-24 DIAGNOSIS — E039 Hypothyroidism, unspecified: Secondary | ICD-10-CM | POA: Diagnosis not present

## 2019-08-24 DIAGNOSIS — E1140 Type 2 diabetes mellitus with diabetic neuropathy, unspecified: Secondary | ICD-10-CM | POA: Insufficient documentation

## 2019-08-24 DIAGNOSIS — E785 Hyperlipidemia, unspecified: Secondary | ICD-10-CM | POA: Diagnosis not present

## 2019-08-24 DIAGNOSIS — E1142 Type 2 diabetes mellitus with diabetic polyneuropathy: Secondary | ICD-10-CM

## 2019-08-24 DIAGNOSIS — I10 Essential (primary) hypertension: Secondary | ICD-10-CM | POA: Diagnosis not present

## 2019-08-24 DIAGNOSIS — R202 Paresthesia of skin: Secondary | ICD-10-CM | POA: Diagnosis not present

## 2019-08-24 HISTORY — DX: Type 2 diabetes mellitus with diabetic neuropathy, unspecified: E11.40

## 2019-08-24 NOTE — Telephone Encounter (Signed)
[employer] medicare order sent to GI. No auth they will reach to the patient to schedule.

## 2019-08-24 NOTE — Patient Instructions (Signed)
We will start capsicin cream applied to the affected area at least three times a day.

## 2019-08-24 NOTE — Progress Notes (Signed)
Reason for visit: Abdominal pain  Referring physician: Dr. Larkin Harold [name] is a 64 y.o. male  History of present illness:  Harold [name] is a 64 year old left-handed white male with a history of diabetes, morbid obesity, sleep apnea, cerebrovascular disease, and a significant diabetic peripheral neuropathy.  The patient was seen in 2015 for the neuropathy.  He has not required any medications for treatment of the discomfort.  He indicates that in February 2021 he began developing a gradual onset of discomfort in the anterior portion of the abdomen.  The discomfort is superficial, and he has significant discomfort with light touch on the skin.  The sensation alteration is on both sides of the body anteriorly affecting the T5 level through T7 levels.  He denies any back pain or pain radiating around from the back.  He has been seen through gastroenterology, no source of the discomfort was noted.  The patient does not relate any dietary alterations to the abdominal pain.  He indicates that light touch from clothing may be uncomfortable for him.  He reports a burning sensation and occasional lancinating pains.  He has been placed on gabapentin taking 600 mg 3 times daily which has helped some but he still has a lot of discomfort.  He also reports some numbness and tingling in the feet, he does have some balance issues, he does not report any recent falls.  He denies issues controlling the bowels or the bladder.  He has a prior history of lumbosacral spine surgery.  He is on allopurinol and colchicine for gout.  He has been on colchicine on a daily basis for about a year.  He is sent to this office for an evaluation.  Past Medical History:  Diagnosis Date  . Anginal pain [facility]) March 2015   Cardiac cath showed patent stents with distal LAD, circumflex-OM and RCA disease in small vessels.  . Anxiety   . CAD S/P percutaneous coronary angioplasty 2003, 04/2012   status post PCI to LAD,  circumflex-OM 2, RCA  . Carotid artery occlusion   . Chronic renal insufficiency, stage II (mild)   . Chronic venous insufficiency    varicosities, no reflux; dopplers 04/14/12- valvular insufficiency in the R and L GSV  . Complication of anesthesia   . COPD, mild ([facility])   . Depression    situaltional   . Diabetes mellitus   . Dyslipidemia associated with type 2 diabetes mellitus ([place])   . GERD (gastroesophageal reflux disease)   . HTN (hypertension)   . Hypothyroidism   . Neuromuscular disorder (HCC)    neuropathy in feet  . Neuropathy    notably improved following PCI with improved cardiac function  . Obesity   . 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 PONV (postoperative nausea and vomiting)    "[place]"  . Pulmonary hypertension ([place]) 04/2012   PA pressure 28mhg  . Sleep apnea    uses nightly    Past Surgical History:  Procedure Laterality Date  . ABDOMINAL AORTAGRAM N/A 11/15/2011   Procedure: ABDOMINAL AMaxcine Ham  Surgeon: CElam Dutch MD;  [facility];  Service: Cardiovascular;  [name];  . ABIs  04/27/2012   mild bilateral arterial insufficiency  . BACK SURGERY  2005 x1   X2-2010  . BUNIONECTOMY Right 11/11/2013   Procedure: RIGHT FOOT SILVER BUNIONECTOMY;  Surgeon: JWylene Simmer MD;  Location: [place];  Service: Orthopedics;  Laterality: Right;  . CARDIAC CATHETERIZATION  12/11/2001   significant 3V CAD, normal LV function  . Carotid Doppler  04/27/2012   right internal carotid: Elevated velocities but no evidence of plaque. Left internal carotid 40-59%  . CAROTID ENDARTERECTOMY  2005   Right; recent carotid Dopplers notes elevated velocities.   . colonscopy    . CORONARY ANGIOPLASTY  12/21/2001   PTCA of the distal and mid AV groove circ, unsuccessful PTCA of second OM total occlusion, unsuccessful PTCA of the apical LAD total occlusion    . CORONARY ANGIOPLASTY WITH STENT PLACEMENT  04/29/12   PCI to 3 RCA lesions, Promus Premiere 2.74mx8mm distally, mid was 2.570m8mm and proximally 2.75x1643mEF 55-60%  . CORONARY STENT PLACEMENT  04/28/12   PCI to LAD (3x23m24mence DES postdilated to 3.25) and circ prox and mid (2 overlappinmg 2.5mmx80mmXience DES postdilated to 2.75mm)79mDOPPLER ECHOCARDIOGRAPHY  04/28/2012   poor quality study: EF estimated 60-65%; unable to assess diastolic function (previously noted to have diastolic dysfunction); severely dilated left atrium and mild right atrium; dilated IVC consistent with increased central venous pressure..  . HERNIA REPAIR    . LEFT AND RIGHT HEART CATHETERIZATION WITH CORONARY ANGIOGRAM N/A 04/27/2012   Procedure: LEFT AND RIGHT HEART CATHETERIZATION WITH CORONARY ANGIOGRAM;  Surgeon: David Leonie Man [facility];  Service: Cardiovascular;  [name];  . LEFT AND RIGHT HEART CATHETERIZATION WITH CORONARY ANGIOGRAM N/A 05/14/2013   Procedure: LEFT AND RIGHT HEART CATHETERIZATION WITH CORONARY ANGIOGRAM;  Surgeon: ThomasTroy Sine [facility]: Moderate Pulm HTN: 46/16 - mean 33 mmHg; PCWP 22mmHg51[name], [name], Patent RCA ostial mid and distal stents - 70% distal runoff Dz  . LUMBAR LAMINECTOMY/DECOMPRESSION MICRODISCECTOMY  01/07/2012   Procedure: LUMBAR LAMINECTOMY/DECOMPRESSION MICRODISCECTOMY 1 LEVEL;  Surgeon: Kyle L Winfield CunasLocation: [facility] NEURO [facility];  Service: Neurosurgery;  Laterality: Bilateral;   Lumbar Three-Four Decompression  . PERCUTANEOUS CORONARY STENT INTERVENTION (PCI-S) N/A 04/28/2012   Procedure: PERCUTANEOUS CORONARY STENT INTERVENTION (PCI-S);  Surgeon: Thomas Troy Sine[facility];  Service: Cardiovascular;  [name];  . PERCUTANEOUS CORONARY STENT INTERVENTION (PCI-S) N/A 04/29/2012   Procedure: PERCUTANEOUS  CORONARY STENT INTERVENTION (PCI-S);  Surgeon: David WLeonie Man[facility];  Service: Cardiovascular;  [name];  . SPINE SURGERY    . UMBILICAL HERNIA REPAIR  2009   steel mesh insert    Family History  Adopted: Yes  Family history unknown: Yes    Social history:  reports that he quit smoking about 16 years ago. His smoking use included cigarettes. He has a 42.00 pack-year smoking history. He has never used smokeless tobacco. He reports that he does not drink alcohol and does not use drugs.  Medications:  Prior to Admission medications   Medication Sig Start Date End Date Taking? Authorizing Provider  allopurinol (ZYLOPRIM) 100 MG tablet Take 100 mg by mouth daily. 08/20/17  Yes [provider]  aspirin EC 81 MG tablet Take 1 tablet (81 mg total) by mouth daily. 06/19/17  Yes HardingLeonie Manarvedilol (COREG) 6.25 MG tablet TAKE 1 TABLET (6.25 MG TOTAL) BY MOUTH 2 (TWO) TIMES DAILY WITH A MEAL. 08/12/19  Yes Berry, Lorretta HarpOLCRYS 0.6 MG tablet Take 0.6 mg  by mouth daily. 02/19/18  Yes [provider]  Continuous Blood Gluc Receiver (FREESTYLE LIBRE 14 DAY READER) DEVI FreeStyle Libre 14 Day Reader  USE AS DIRECTED   Yes [provider]  Continuous Blood Gluc Sensor (FREESTYLE LIBRE 14 DAY SENSOR) MISC APPLY 1 SENSOR EVERY 14 DAYS 08/25/17  Yes [provider]  empagliflozin (JARDIANCE) 25 MG TABS tablet Jardiance 25 mg tablet  TAKE 1 TABLET BY MOUTH EVERY DAY   Yes [provider]  escitalopram (LEXAPRO) 20 MG tablet TAKE 1 TABLET (20 MG TOTAL) BY MOUTH DAILY. 03/15/16  Yes Jeffery, Chelle, PA  furosemide (LASIX) 80 MG tablet Take 80 mg by mouth 3 (three) times daily. TAKE 160 MG BY MOUTH 3 ( THREE) TIMES DAILY   Yes [provider]  gabapentin (NEURONTIN) 300 MG capsule Take 600 mg by mouth in the morning, at noon, and at bedtime. 08/03/19  Yes [provider]  insulin regular human CONCENTRATED  (HUMULIN R U-500 KWIKPEN) 500 UNIT/ML kwikpen Inject 110 Units into the skin 3 (three) times daily with meals.   Yes [provider]  levothyroxine (SYNTHROID, LEVOTHROID) 88 MCG tablet Take 88 mcg by mouth daily before breakfast.   Yes [provider]  losartan (COZAAR) 25 MG tablet Take 1 tablet (25 mg total) by mouth daily. 08/12/19 11/10/19 Yes Lorretta Harp, MD  nitroGLYCERIN (NITROSTAT) 0.4 MG SL tablet PLACE 1 TAB UNDER THE TONGUE EVERY 5 MINUTES AS NEEDED FOR CHEST PAIN (SEVERE PRESSURE OR TIGHTNESS) 02/22/19  Yes Leonie Man, MD  omeprazole (PRILOSEC) 20 MG capsule TAKE 1 CAPSULE BY MOUTH EVERY DAY 06/10/19  Yes Leonie Man, MD  potassium chloride (MICRO-K) 10 MEQ CR capsule Take by mouth daily. 10/19/17  Yes [provider]  rosuvastatin (CRESTOR) 20 MG tablet Take 10 mg by mouth daily.  08/13/18  Yes [provider]  TRULICITY 1.5 VK/1.8MC SOPN Inject 4.5 mg into the skin once a week.  02/17/16  Yes [provider]  XARELTO 2.5 MG TABS tablet TAKE 1 TABLET BY MOUTH TWICE A DAY 04/23/18  Yes Leonie Man, MD      Allergies  Allergen Reactions  . Septra [Bactrim] Itching  . Penicillins Hives    Allergy to All cillin drugs  Ancef given 9/24 with no obvious reaction  . Clopidogrel     NON RESPONDER  P2Y12  . Metformin And Related     ROS:  Out of a complete 14 system review of symptoms, the patient complains only of the following symptoms, and all other reviewed systems are negative.  Abdominal discomfort Foot numbness Slight balance problems  Blood pressure 139/72, pulse 64, height _0  (1.854 m), weight (!) 327 lb 8 oz (148.6 kg).  Physical Exam  General: The patient is alert and cooperative at the time of the examination.  The patient is markedly obese.  Eyes: Pupils are equal, round, and reactive to light. Discs are flat bilaterally.  Neck: The neck is supple, no carotid bruits are noted.  Respiratory: The  respiratory examination is clear.  Cardiovascular: The cardiovascular examination reveals a regular rate and rhythm, no obvious murmurs or rubs are noted.  Skin: Extremities are with 1-2+ edema below the knees bilaterally.  Neurologic Exam  Mental status: The patient is alert and oriented x 3 at the time of the examination. The patient has apparent normal recent and remote memory, with an apparently normal attention span and concentration ability.  Cranial nerves: Facial symmetry  is present. There is good sensation of the face to pinprick and soft touch bilaterally. The strength of the facial muscles and the muscles to head turning and shoulder shrug are normal bilaterally. Speech is well enunciated, no aphasia or dysarthria is noted. Extraocular movements are full. Visual fields are full. The tongue is midline, and the patient has symmetric elevation of the soft palate. No obvious hearing deficits are noted.  Motor: The motor testing reveals 5 over 5 strength of all 4 extremities, with exception of mild bilateral foot drop. Good symmetric motor tone is noted throughout.  Sensory: Sensory testing is intact to pinprick, soft touch, vibration sensation, and position sense on the upper extremities.  With the lower extremities there is a stocking pattern pinprick sensory deficit two thirds the way up the legs bilaterally with significant impairment of vibration and position sense in both feet.  With the abdomen, there is an area of hypersensitivity in the anterior portions bilaterally of the T5-T7 levels going posteriorly about one third of the circumferential distance of the abdomen and back.  The sensory alteration is bilaterally symmetric.  No evidence of extinction is noted.  Coordination: Cerebellar testing reveals good finger-nose-finger and heel-to-shin bilaterally.  Gait and station: Gait is slightly wide-based. Tandem gait is unsteady. Romberg is negative. No drift is seen.  Reflexes: Deep  tendon reflexes are symmetric, but are depressed bilaterally. Toes are downgoing bilaterally.   Assessment/Plan:  1.  Diabetic peripheral neuropathy  2.  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  He is also on colchicine which may be a contributing factor.  The patient is to continue the gabapentin for now, he will try using topical capsaicin 3 times a day over the next several weeks to see if this improves his discomfort.  If this is effective, he will continue this treatment and he may potentially be able to get off of the gabapentin.  The patient will be sent for MRI of the thoracic spine.  He will follow-up here in 4 months.  Jill Alexanders MD 08/24/2019 7:56 AM  [facility] [address] [place], [place] [address]  Phone [phone] Fax [phone]

## 2019-08-25 ENCOUNTER — Telehealth: Payer: Self-pay | Admitting: *Deleted

## 2019-08-25 NOTE — Telephone Encounter (Signed)
Called  Left message on voicemail --  Appointment schedule with Dr Fletcher Anon on 09/14/19 at 11 am.  Appointment is to discuss  Upcoming  lower extremities dopplers  if patient is unable to make this appointment , please call and reschedule

## 2019-08-26 ENCOUNTER — Other Ambulatory Visit: Payer: Medicare Other

## 2019-08-28 ENCOUNTER — Telehealth: Payer: Self-pay | Admitting: Neurology

## 2019-08-28 NOTE — Telephone Encounter (Signed)
Patient paged the on-call to say that the capsaicin gave him a rash, red, but no signs or symptoms of anaphylaxis.  I advised patient to of course stop the capsaicin cream, he could try Benadryl several times a day, if the rash starts spreading, becomes painful, hot, affecting his breathing or swelling of his lips or throat, if it starts looking infected such as seeping or anything concerning he should go to the emergency room or call 911.

## 2019-08-29 NOTE — Telephone Encounter (Signed)
I called patient and left a message.  If the capsaicin is being used on the feet and the feet are turning red that is an expected reaction to the capsaicin.  If this is the case, he is to continue the capsaicin.  If the redness or rash is distant to the site of the application or there is evidence of welts or skin breakdown, the medication should be stopped.

## 2019-08-30 ENCOUNTER — Telehealth: Payer: Self-pay | Admitting: Cardiology

## 2019-08-30 ENCOUNTER — Encounter (HOSPITAL_COMMUNITY): Payer: Medicare Other

## 2019-08-30 ENCOUNTER — Ambulatory Visit (HOSPITAL_COMMUNITY): Payer: Medicare Other

## 2019-08-30 DIAGNOSIS — I10 Essential (primary) hypertension: Secondary | ICD-10-CM | POA: Diagnosis not present

## 2019-08-30 DIAGNOSIS — G4733 Obstructive sleep apnea (adult) (pediatric): Secondary | ICD-10-CM | POA: Diagnosis not present

## 2019-08-30 DIAGNOSIS — Z9989 Dependence on other enabling machines and devices: Secondary | ICD-10-CM | POA: Diagnosis not present

## 2019-08-30 DIAGNOSIS — R809 Proteinuria, unspecified: Secondary | ICD-10-CM | POA: Diagnosis not present

## 2019-08-30 NOTE — Telephone Encounter (Signed)
New message   Patient states that he is having swelling in leg and he states that his leg is turning purple. Please call to discuss.

## 2019-08-30 NOTE — Telephone Encounter (Signed)
Left a message for the patient to call back.  He had a lower extremity duplex completed on 6/2 which was abnormal.  -right and left severe obstructive diseased of popliteal, femoral and tibial.  He is scheduled for a PV consult on 7/27 with Dr. Fletcher Anon He is scheduled for a LEA/ABI on 09/23/19

## 2019-09-01 ENCOUNTER — Ambulatory Visit: Payer: Medicare Other | Admitting: Cardiology

## 2019-09-02 NOTE — Telephone Encounter (Signed)
Left message for pt to call

## 2019-09-03 NOTE — Telephone Encounter (Signed)
Called patient, LVM to call back to discuss- left call back number.

## 2019-09-03 NOTE — Telephone Encounter (Signed)
Follow up  Patient is returning call. Please give patient a call back.

## 2019-09-05 ENCOUNTER — Ambulatory Visit
Admission: RE | Admit: 2019-09-05 | Discharge: 2019-09-05 | Disposition: A | Discharge status: Home / Self Care | Payer: Medicare Other | Source: Ambulatory Visit | Attending: Neurology | Admitting: Neurology

## 2019-09-05 DIAGNOSIS — R202 Paresthesia of skin: Secondary | ICD-10-CM

## 2019-09-05 DIAGNOSIS — R200 Anesthesia of skin: Secondary | ICD-10-CM | POA: Diagnosis not present

## 2019-09-06 ENCOUNTER — Telehealth: Payer: Self-pay | Admitting: Neurology

## 2019-09-06 ENCOUNTER — Telehealth: Payer: Self-pay | Admitting: *Deleted

## 2019-09-06 NOTE — Telephone Encounter (Signed)
Called and left message -RN moved appointment with Dr Fletcher Anon to 09/28/19 due to doppler testing being moved to 09/23/19.  If date or time is not suitable please contact RN or office scheduler.

## 2019-09-06 NOTE — Telephone Encounter (Signed)
  I called the patient.  MRI of the thoracic spine does not show spinal cord or nerve root impingement that would lead to pain, the patient likely has a small fiber neuropathy associated with diabetes.  If the capsaicin ointment does not appear to be effective, he is to contact our office and we will consider an oral medication for his pain.   MRI thoracic 09/05/19:  IMPRESSION: Abnormal MRI scan of thoracic spine without contrast showing mild disc and endplate degenerative changes at T5-6, T6-7 and T7-8 but without significant compression.

## 2019-09-06 NOTE — Telephone Encounter (Signed)
I called the patient.  I discussed the MRI report with him.  He indicates that he stopped using the capsaicin for about 24 hours, he is started using it again and is now tolerating it.  It is starting to help his pain significantly, the sharp pain in sensitivity to light touch is improved, he still has some discomfort with pressure.  He is feeling better with combination of the gabapentin and capsaicin.

## 2019-09-06 NOTE — Telephone Encounter (Signed)
Patient called back for results. I attempted to give results but he wanted to speak directly with Dr. Jannifer Franklin. Per patient, he can be reached at [phone].

## 2019-09-09 ENCOUNTER — Ambulatory Visit: Payer: Medicare Other | Admitting: Podiatry

## 2019-09-09 ENCOUNTER — Ambulatory Visit (INDEPENDENT_AMBULATORY_CARE_PROVIDER_SITE_OTHER): Payer: Medicare Other

## 2019-09-09 ENCOUNTER — Encounter: Payer: Self-pay | Admitting: Podiatry

## 2019-09-09 ENCOUNTER — Other Ambulatory Visit: Payer: Self-pay

## 2019-09-09 DIAGNOSIS — I70213 Atherosclerosis of native arteries of extremities with intermittent claudication, bilateral legs: Secondary | ICD-10-CM | POA: Diagnosis not present

## 2019-09-09 DIAGNOSIS — M79674 Pain in right toe(s): Secondary | ICD-10-CM

## 2019-09-09 DIAGNOSIS — B351 Tinea unguium: Secondary | ICD-10-CM | POA: Diagnosis not present

## 2019-09-09 DIAGNOSIS — M21612 Bunion of left foot: Secondary | ICD-10-CM

## 2019-09-09 DIAGNOSIS — M2012 Hallux valgus (acquired), left foot: Secondary | ICD-10-CM

## 2019-09-09 DIAGNOSIS — M21622 Bunionette of left foot: Secondary | ICD-10-CM

## 2019-09-09 DIAGNOSIS — I739 Peripheral vascular disease, unspecified: Secondary | ICD-10-CM | POA: Diagnosis not present

## 2019-09-09 DIAGNOSIS — E1142 Type 2 diabetes mellitus with diabetic polyneuropathy: Secondary | ICD-10-CM | POA: Diagnosis not present

## 2019-09-09 DIAGNOSIS — M79675 Pain in left toe(s): Secondary | ICD-10-CM

## 2019-09-09 DIAGNOSIS — M21621 Bunionette of right foot: Secondary | ICD-10-CM

## 2019-09-09 DIAGNOSIS — J449 Chronic obstructive pulmonary disease, unspecified: Secondary | ICD-10-CM

## 2019-09-09 NOTE — Progress Notes (Signed)
Subjective:  Patient ID: Harold [name], male    DOB: 01/10/1956,  MRN: [id number]  Chief Complaint  Patient presents with  . Diabetes    A1C  7.1, diabetic foot care  . Nail Problem    thick painful toenails, patient is taking Xarelto  . Peripheral Neuropathy    64 y.o. male presents with the above complaint. History confirmed with patient.  Previously had bunion surgery on the right foot by Dr. Doran Durand here in [place].  He was seeing a doctor at [facility] [facility] for his left foot bunion, and they had recommended a bunionectomy, however they were concerned about the potential for peripheral arterial disease, and were sending him to a vascular specialist following ultrasound in [place].  He did not want to travel that far, and wanted to be seen here today for the same bunion so that he may keep all his doctors within the [facility] health system.  Objective:  Physical Exam: no trophic changes or ulcerative lesions, reduced sensation at plantar pulps of toes and feet, large spider veins, varicosities and torturous veins in the lower legs along with venous stasis dermatitis noted and nonpalpable pedal pulses, some mottling of the left foot with cooler temperature than the right foot. Left Foot: Moderate to severe hallux valgus with semirigid hammertoes present, mycotic nails x5 Right Foot: Mild to moderate hallux valgus with semirigid hammertoes present, mycotic nails since 5    Radiographs: X-ray of both feet: On the left foot there is severe hallux valgus with met primus varus and metatarsus adductus along with first ray elevatus on the left foot there is moderate hallux valgus with degenerative changes in the first metatarsophalangeal joint, previous bunionectomy with removal of the medial eminence, unclear if there was an osteotomy or prior fixation that has since been removed Assessment:   1. Hallux valgus with bunions, left   2. Tailor's bunion of both feet   3. Diabetic polyneuropathy  associated with type 2 diabetes mellitus ([place])   4. PAD (peripheral artery disease) ([place])   5. Atherosclerosis of native artery of both lower extremities with intermittent claudication ([place])   6. Chronic obstructive pulmonary disease, unspecified COPD type ([place])      Plan:  Patient was evaluated and treated and all questions answered.  Had a long discussion with him today regarding his multiple medical comorbidities and his left foot bunion.  He desires surgical correction of the bunion as it is understandably quite large and becoming very painful for him.  I discussed he has a number of risk factors currently that would make surgical intervention quite risky including elevated A1c of 8.1%, peripheral arterial and venous disease which is being addressed in the near future by his cardiovascular doctor (I offered to refer him to our vascular surgeons, but he is happy with his current doctor and will continue seeing them which is fine), obesity, and neuropathy.  He does not smoke currently.  In the interim I discussed proper shoe gear and wider shoes as well as diabetic shoes which would provide accommodation for the bunion deformity for now.  He states he eats when he is very stressed and does not know when to stop and has gained a little weight because of this and he cannot exercise very much because of his heart and pulmonary issues.  I did discuss with him that if he is able to have his vascular disease addressed and have proper blood flow established to the foot, lower his A1c to 7% or below,  and lose some weight, we potentially would be able to pursue bunionectomy.  I think he potentially could be a good candidate for a minimally invasive bunionectomy with a smaller incision, which would offer enough correction at the distal metatarsal to remove the medial eminence and correct the majority of the bunion deformity.  He has significant deformity at the tarsometatarsal joints, however I do not think that  an open metatarsus adductus repair or Lapidus bunionectomy would be in his best interest given his comorbidities.  Patient educated on diabetes. Discussed proper diabetic foot care and discussed risks and complications of disease. Educated patient in depth on reasons to return to the office immediately should he/she discover anything concerning or new on the feet. All questions answered. Discussed proper shoes as well.    Discussed the etiology and treatment options for the condition in detail with the patient. Educated patient on the topical and oral treatment options for mycotic nails. Recommended debridement of the nails today. Sharp and mechanical debridement performed of all painful and mycotic nails today. Nails debrided in length and thickness using a nail nipper and a mechanical burr to level of comfort. Discussed treatment options including appropriate shoe gear. Follow up as needed for painful nails.     Return in about 3 months (around 12/10/2019) for diabetic nail trim, re-check blood flow, and bunion.

## 2019-09-09 NOTE — Patient Instructions (Signed)
Diabetes Mellitus and Foot Care Foot care is an important part of your health, especially when you have diabetes. Diabetes may cause you to have problems because of poor blood flow (circulation) to your feet and legs, which can cause your skin to:  Become thinner and drier.  Break more easily.  Heal more slowly.  Peel and crack. You may also have nerve damage (neuropathy) in your legs and feet, causing decreased feeling in them. This means that you may not notice minor injuries to your feet that could lead to more serious problems. Noticing and addressing any potential problems early is the best way to prevent future foot problems. How to care for your feet Foot hygiene  Wash your feet daily with warm water and mild soap. Do not use hot water. Then, pat your feet and the areas between your toes until they are completely dry. Do not soak your feet as this can dry your skin.  Trim your toenails straight across. Do not dig under them or around the cuticle. File the edges of your nails with an emery board or nail file.  Apply a moisturizing lotion or petroleum jelly to the skin on your feet and to dry, brittle toenails. Use lotion that does not contain alcohol and is unscented. Do not apply lotion between your toes. Shoes and socks  Wear clean socks or stockings every day. Make sure they are not too tight. Do not wear knee-high stockings since they may decrease blood flow to your legs.  Wear shoes that fit properly and have enough cushioning. Always look in your shoes before you put them on to be sure there are no objects inside.  To break in new shoes, wear them for just a few hours a day. This prevents injuries on your feet. Wounds, scrapes, corns, and calluses  Check your feet daily for blisters, cuts, bruises, sores, and redness. If you cannot see the bottom of your feet, use a mirror or ask someone for help.  Do not cut corns or calluses or try to remove them with medicine.  If you  find a minor scrape, cut, or break in the skin on your feet, keep it and the skin around it clean and dry. You may clean these areas with mild soap and water. Do not clean the area with peroxide, alcohol, or iodine.  If you have a wound, scrape, corn, or callus on your foot, look at it several times a day to make sure it is healing and not infected. Check for: ? Redness, swelling, or pain. ? Fluid or blood. ? Warmth. ? Pus or a bad smell. General instructions  Do not cross your legs. This may decrease blood flow to your feet.  Do not use heating pads or hot water bottles on your feet. They may burn your skin. If you have lost feeling in your feet or legs, you may not know this is happening until it is too late.  Protect your feet from hot and cold by wearing shoes, such as at the beach or on hot pavement.  Schedule a complete foot exam at least once a year (annually) or more often if you have foot problems. If you have foot problems, report any cuts, sores, or bruises to your health care provider immediately. Contact a health care provider if:  You have a medical condition that increases your risk of infection and you have any cuts, sores, or bruises on your feet.  You have an injury that is not  healing.  You have redness on your legs or feet.  You feel burning or tingling in your legs or feet.  You have pain or cramps in your legs and feet.  Your legs or feet are numb.  Your feet always feel cold.  You have pain around a toenail. Get help right away if:  You have a wound, scrape, corn, or callus on your foot and: ? You have pain, swelling, or redness that gets worse. ? You have fluid or blood coming from the wound, scrape, corn, or callus. ? Your wound, scrape, corn, or callus feels warm to the touch. ? You have pus or a bad smell coming from the wound, scrape, corn, or callus. ? You have a fever. ? You have a red line going up your leg. Summary  Check your feet every day  for cuts, sores, red spots, swelling, and blisters.  Moisturize feet and legs daily.  Wear shoes that fit properly and have enough cushioning.  If you have foot problems, report any cuts, sores, or bruises to your health care provider immediately.  Schedule a complete foot exam at least once a year (annually) or more often if you have foot problems. This information is not intended to replace advice given to you by your health care provider. Make sure you discuss any questions you have with your health care provider. Document Revised: 10/28/2018 Document Reviewed: 03/08/2016 Elsevier Patient Education  [place].

## 2019-09-14 ENCOUNTER — Ambulatory Visit: Payer: Medicare Other | Admitting: Cardiovascular Disease

## 2019-09-14 NOTE — Progress Notes (Signed)
Dr. Fletcher Anon is [facility]--he does are peripheral vascular disease evaluation.  He can see me first and I can refer to Dr. Fletcher Anon if were going to actually consider PV evaluation.   Glenetta Hew, MD

## 2019-09-23 ENCOUNTER — Other Ambulatory Visit: Payer: Self-pay

## 2019-09-23 ENCOUNTER — Ambulatory Visit (HOSPITAL_COMMUNITY)
Admission: RE | Admit: 2019-09-23 | Discharge: 2019-09-23 | Disposition: A | Discharge status: Home / Self Care | Payer: Medicare Other | Source: Ambulatory Visit | Attending: Cardiovascular Disease | Admitting: Cardiovascular Disease

## 2019-09-23 DIAGNOSIS — G629 Polyneuropathy, unspecified: Secondary | ICD-10-CM | POA: Diagnosis not present

## 2019-09-23 DIAGNOSIS — I70213 Atherosclerosis of native arteries of extremities with intermittent claudication, bilateral legs: Secondary | ICD-10-CM | POA: Diagnosis not present

## 2019-09-23 DIAGNOSIS — I739 Peripheral vascular disease, unspecified: Secondary | ICD-10-CM | POA: Diagnosis not present

## 2019-09-23 DIAGNOSIS — E1149 Type 2 diabetes mellitus with other diabetic neurological complication: Secondary | ICD-10-CM | POA: Insufficient documentation

## 2019-09-28 ENCOUNTER — Other Ambulatory Visit: Payer: Self-pay

## 2019-09-28 ENCOUNTER — Ambulatory Visit (INDEPENDENT_AMBULATORY_CARE_PROVIDER_SITE_OTHER): Payer: Medicare Other | Admitting: Cardiovascular Disease

## 2019-09-28 VITALS — BP 140/70 | HR 65 | Temp 93.7°F | Ht 73.0 in | Wt 323.8 lb

## 2019-09-28 DIAGNOSIS — Z01818 Encounter for other preprocedural examination: Secondary | ICD-10-CM | POA: Diagnosis not present

## 2019-09-28 DIAGNOSIS — I2510 Atherosclerotic heart disease of native coronary artery without angina pectoris: Secondary | ICD-10-CM

## 2019-09-28 DIAGNOSIS — I739 Peripheral vascular disease, unspecified: Secondary | ICD-10-CM | POA: Diagnosis not present

## 2019-09-28 DIAGNOSIS — I10 Essential (primary) hypertension: Secondary | ICD-10-CM | POA: Diagnosis not present

## 2019-09-28 DIAGNOSIS — E785 Hyperlipidemia, unspecified: Secondary | ICD-10-CM

## 2019-09-28 NOTE — Patient Instructions (Addendum)
Medication Instructions:  No changes *If you need a refill on your cardiac medications before your next appointment, please call your pharmacy*  Testing/Procedures: Your physician has requested that you have a peripheral vascular angiogram. This exam is performed at the hospital. During this exam IV contrast is used to look at arterial blood flow. Please review the information sheet given for details.   Follow-Up: At [facility], you and your health needs are our priority.  As part of our continuing mission to provide you with exceptional heart care, we have created designated Provider Care Teams.  These Care Teams include your primary Cardiologist (physician) and Advanced Practice Providers (APPs -  Physician Assistants and Nurse Practitioners) who all work together to provide you with the care you need, when you need it.  We recommend signing up for the patient portal called "MyChart".  Sign up information is provided on this After Visit Summary.  MyChart is used to connect with patients for Virtual Visits (Telemedicine).  Patients are able to view lab/test results, encounter notes, upcoming appointments, etc.  Non-urgent messages can be sent to your provider as well.   To learn more about what you can do with MyChart, go to [email or web address].    Your next appointment:   Keep your post procedure follow up with Dr. Fletcher Anon on 11/09/19 at 8:40 am     [address] [place] [address] Dept: [phone] Loc: [place]  09/28/2019  You are scheduled for a Peripheral Angiogram on Wednesday, August 25 with Dr. Kathlyn Sacramento.  1. Please arrive at the [facility] (Main Entrance A) at [facility]: [address] [place], [place] [address] at 6:00 AM (This time is four hours before your procedure to ensure your preparation). Free valet parking service is  available.   Special note: Every effort is made to have your procedure done on time. Please understand that emergencies sometimes delay scheduled procedures.  2. Diet: Do not eat solid foods after midnight.  The patient may have clear liquids until 5am upon the day of the procedure.  3. Labs: You will need to have blood drawn today: CBC and BMET  You will need to have the coronavirus test completed prior to your procedure. An appointment has been made at 10:40am on 10/09/19. This is a Drive Up Visit at [address], [place], [place] [address]. Please tell them that you are there for procedure testing. Stay in your car and someone will be with you shortly. Please make sure to have all other labs completed before this test because you will need to stay quarantined until your procedure.  4. Medication instructions in preparation for your procedure: Hold all diabetic medication the morning of the procedure (Jardiance and Trulicity) Hold Furosemide and potassium the morning of the procedure Hold Xarelto 2 days prior to the procedure (starting 8/23 and 8/24)  On the morning of your procedure, take your Aspirin and any morning medicines NOT listed above.  You may use sips of water.  5. Plan for one night stay--bring personal belongings. 6. Bring a current list of your medications and current insurance cards. 7. You MUST have a responsible person to drive you home. 8. Someone MUST be with you the first 24 hours after you arrive home or your discharge will be delayed. 9. Please wear clothes that are easy to get on and off and wear slip-on shoes.  Thank  you for allowing [place] to care for you!   -- [facility]

## 2019-09-28 NOTE — Progress Notes (Signed)
Cardiology Office Note   Date:  09/30/2019   ID:  Harold [name], DOB 19-Jan-1956, MRN [id number]  PCP:  Deland Pretty, MD  Cardiologist: Dr. Ellyn Hack.  No chief complaint on file.     History of Present Illness: Harold [name] is a 64 y.o. male who was referred by Dr. Ellyn Hack for evaluation and management of peripheral arterial disease.  He has known history of carotid disease, obstructive sleep apnea on CPAP, peripheral arterial disease, essential hypertension, obesity, chronic kidney disease, type 2 diabetes and hyperlipidemia.  He has known history of coronary artery disease with previous stenting.  He reports bilateral leg fatigue as well as discomfort in both calfs. Right is worse than left and it happens after walking less than 50 feet. He reports that his right foot turns purple. He reports inability to exercise due to severe pain. He has been diabetic since 1996. He is not a smoker. He underwent noninvasive vascular evaluation which showed an ABI of 0.72 on the right and 0.90 on the left. Duplex showed severe disease in the right TP trunk with occluded anterior tibial. On the left, the anterior tibial was occluded.  Past Medical History:  Diagnosis Date  . Anginal pain [facility]) March 2015   Cardiac cath showed patent stents with distal LAD, circumflex-OM and RCA disease in small vessels.  . Anxiety   . CAD S/P percutaneous coronary angioplasty 2003, 04/2012   status post PCI to LAD, circumflex-OM 2, RCA  . Carotid artery occlusion   . Chronic renal insufficiency, stage II (mild)   . Chronic venous insufficiency    varicosities, no reflux; dopplers 04/14/12- valvular insufficiency in the R and L GSV  . Complication of anesthesia   . COPD, mild ([place])   . Depression    situaltional   . Diabetes mellitus   . Diabetic neuropathy ([place]) 08/24/2019  . Dyslipidemia associated with type 2 diabetes mellitus ([place])   . GERD (gastroesophageal reflux disease)   . HTN  (hypertension)   . Hypothyroidism   . Neuromuscular disorder (H[facility])    neuropathy in feet  . Neuropathy    notably improved following PCI with improved cardiac function  . Obesity   . 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 PONV (postoperative nausea and vomiting)    "[place]"  . Pulmonary hypertension ([place]) 04/2012   PA pressure 9mhg  . Sleep apnea    uses nightly    Past Surgical History:  Procedure Laterality Date  . ABDOMINAL AORTAGRAM N/A 11/15/2011   Procedure: ABDOMINAL AMaxcine Ham  Surgeon: CElam Dutch MD;  [facility];  Service: Cardiovascular;  [name];  . ABIs  04/27/2012   mild bilateral arterial insufficiency  . BACK SURGERY  2005 x1   X2-2010  . BUNIONECTOMY Right 11/11/2013   Procedure: RIGHT FOOT SILVER BUNIONECTOMY;  Surgeon: JWylene Simmer MD;  [place]  Service: Orthopedics;  Laterality: Right;  . CARDIAC CATHETERIZATION  12/11/2001   significant 3V CAD, normal LV function  . Carotid Doppler  04/27/2012   right internal carotid: Elevated velocities but no evidence of plaque. Left internal carotid 40-59%  . CAROTID ENDARTERECTOMY  2005   Right; recent carotid Dopplers notes elevated velocities.   . colonscopy    . CORONARY ANGIOPLASTY  12/21/2001   PTCA of the distal and mid AV groove circ, unsuccessful PTCA of  second OM total occlusion, unsuccessful PTCA of the apical LAD total occlusion  . CORONARY ANGIOPLASTY WITH STENT PLACEMENT  04/29/12   PCI to 3 RCA lesions, Promus Premiere 2.41mx8mm distally, mid was 2.530m8mm and proximally 2.75x1648mEF 55-60%  . CORONARY STENT PLACEMENT  04/28/12   PCI to LAD (3x23m13mence DES postdilated to 3.25) and circ prox and mid (2 overlappinmg 2.5mmx67mmXience DES postdilated to 2.75mm)64mDOPPLER ECHOCARDIOGRAPHY  04/28/2012   poor quality study: EF estimated 60-65%; unable to assess  diastolic function (previously noted to have diastolic dysfunction); severely dilated left atrium and mild right atrium; dilated IVC consistent with increased central venous pressure..  . HERNIA REPAIR    . LEFT AND RIGHT HEART CATHETERIZATION WITH CORONARY ANGIOGRAM N/A 04/27/2012   Procedure: LEFT AND RIGHT HEART CATHETERIZATION WITH CORONARY ANGIOGRAM;  Surgeon: David Leonie Man [facility];  Service: Cardiovascular;  [name];  . LEFT AND RIGHT HEART CATHETERIZATION WITH CORONARY ANGIOGRAM N/A 05/14/2013   Procedure: LEFT AND RIGHT HEART CATHETERIZATION WITH CORONARY ANGIOGRAM;  Surgeon: ThomasTroy Sine [facility] Moderate Pulm HTN: 46/16 - mean 33 mmHg; PCWP 22mmHg21[name], [name], Patent RCA ostial mid and distal stents - 70% distal runoff Dz  . LUMBAR LAMINECTOMY/DECOMPRESSION MICRODISCECTOMY  01/07/2012   Procedure: LUMBAR LAMINECTOMY/DECOMPRESSION MICRODISCECTOMY 1 LEVEL;  Surgeon: Kyle L Winfield CunasLocation: [facility];  Service: Neurosurgery;  Laterality: Bilateral;   Lumbar Three-Four Decompression  . PERCUTANEOUS CORONARY STENT INTERVENTION (PCI-S) N/A 04/28/2012   Procedure: PERCUTANEOUS CORONARY STENT INTERVENTION (PCI-S);  Surgeon: Thomas Troy Sine[facility];  Service: Cardiovascular;  [name];  . PERCUTANEOUS CORONARY STENT INTERVENTION (PCI-S) N/A 04/29/2012   Procedure: PERCUTANEOUS CORONARY STENT INTERVENTION (PCI-S);  Surgeon: David WLeonie ManLocation[facility];  Service: Cardiovascular;  [name];  . SPINE SURGERY    . UMBILICAL HERNIA REPAIR  2009   steel mesh insert     Current Outpatient Medications  Medication Sig Dispense Refill  . allopurinol (ZYLOPRIM) 100 MG tablet Take 100 mg by mouth daily.  1  . aspirin EC 81 MG tablet Take 1 tablet (81 mg total) by mouth daily. 90 tablet 3  . carvedilol  (COREG) 6.25 MG tablet TAKE 1 TABLET (6.25 MG TOTAL) BY MOUTH 2 (TWO) TIMES DAILY WITH A MEAL. 180 tablet 2  . COLCRYS 0.6 MG tablet Take 0.6 mg by mouth daily.    . Continuous Blood Gluc Receiver (FREESTYLE LIBRE 14 DAY READER) DEVI FreeStyle Libre 14 Day Reader  USE AS DIRECTED    . Continuous Blood Gluc Sensor (FREESTYLE LIBRE 14 DAY SENSOR) MISC APPLY 1 SENSOR EVERY 14 DAYS  11  . empagliflozin (JARDIANCE) 25 MG TABS tablet Jardiance 25 mg tablet  TAKE 1 TABLET BY MOUTH EVERY DAY    . escitalopram (LEXAPRO) 20 MG tablet TAKE 1 TABLET (20 MG TOTAL) BY MOUTH DAILY. 30 tablet 1  . furosemide (LASIX) 80 MG tablet Take 80 mg by mouth 3 (three) times daily. TAKE 160 MG BY MOUTH 3 ( THREE) TIMES DAILY    . gabapentin (NEURONTIN) 300 MG capsule Take 400 mg by mouth in the morning, at noon, and at bedtime.     . insulin regular human CONCENTRATED (HUMULIN R U-500 KWIKPEN) 500 UNIT/ML kwikpen Inject 110 Units into the skin 3 (three) times  daily with meals.    Marland Kitchen levothyroxine (SYNTHROID, LEVOTHROID) 88 MCG tablet Take 88 mcg by mouth daily before breakfast.    . losartan (COZAAR) 25 MG tablet Take 1 tablet (25 mg total) by mouth daily. 90 tablet 2  . nitroGLYCERIN (NITROSTAT) 0.4 MG SL tablet PLACE 1 TAB UNDER THE TONGUE EVERY 5 MINUTES AS NEEDED FOR CHEST PAIN (SEVERE PRESSURE OR TIGHTNESS) 25 tablet 3  . omeprazole (PRILOSEC) 20 MG capsule TAKE 1 CAPSULE BY MOUTH EVERY DAY 90 capsule 3  . potassium chloride (MICRO-K) 10 MEQ CR capsule Take by mouth daily.  5  . rosuvastatin (CRESTOR) 20 MG tablet Take 10 mg by mouth daily.     . TRULICITY 1.5 ZJ/6.9CV SOPN Inject 4.5 mg into the skin once a week.   5  . XARELTO 2.5 MG TABS tablet TAKE 1 TABLET BY MOUTH TWICE A DAY 180 tablet 3   No current facility-administered medications for this visit.    Allergies:   Septra [bactrim], Penicillins, Clopidogrel, and Metformin and related    Social History:  The patient  reports that he quit smoking about 16  years ago. His smoking use included cigarettes. He has a 42.00 pack-year smoking history. He has never used smokeless tobacco. He reports that he does not drink alcohol and does not use drugs.   Family History:  The patient's He was adopted. Family history is unknown by patient.    ROS:  Please see the history of present illness.   Otherwise, review of systems are positive for none.   All other systems are reviewed and negative.    PHYSICAL EXAM: VS:  BP 140/70   Pulse 65   Temp (!) 93.7 F (34.3 C)   Ht _0  (1.854 m)   Wt (!) 323 lb 12.8 oz (146.9 kg)   SpO2 99%   BMI 42.72 kg/m  , BMI Body mass index is 42.72 kg/m. GEN: Well nourished, well developed, in no acute distress  HEENT: normal  Neck: no JVD, carotid bruits, or masses Cardiac: RRR; no murmurs, rubs, or gallops,no edema  Respiratory:  clear to auscultation bilaterally, normal work of breathing GI: soft, nontender, nondistended, + BS MS: no deformity or atrophy  Skin: warm and dry, no rash Neuro:  Strength and sensation are intact Psych: euthymic mood, full affect Vascular: Femoral pulses +1 bilaterally. Distal pulses are not palpable but he does have significant edema limiting the exam.   EKG:  EKG is not ordered today.    Recent Labs: 09/28/2019: BUN 25; Creatinine, Ser 1.73; Hemoglobin 15.8; Platelets 137; Potassium 4.0; Sodium 136    Lipid Panel    Component Value Date/Time   CHOL 178 05/12/2018 1150   TRIG 343 (H) 05/12/2018 1150   HDL 42 05/12/2018 1150   CHOLHDL 4.2 05/12/2018 1150   CHOLHDL 6.0 08/30/2014 1437   VLDL NOT CALC 08/30/2014 1437   LDLCALC 67 05/12/2018 1150      Wt Readings from Last 3 Encounters:  09/28/19 (!) 323 lb 12.8 oz (146.9 kg)  08/24/19 (!) 327 lb 8 oz (148.6 kg)  08/17/19 (!) 327 lb (148.3 kg)       No flowsheet data found.    ASSESSMENT AND PLAN:  1. Peripheral arterial disease with severe right leg claudication [place] class III. He has significant  stenosis in the right TP trunk but that might not really explain all of his symptoms. His femoral pulses are diminished and he might have inflow disease. Considering his  comorbidities, I have suggested trying an exercise walking program to improve his symptoms as well as continue treatment of his risk factors. The patient reports inability to walk even a short distance and he wants to proceed with intervention. I discussed my concern about his chronic kidney disease and risk of contrast-induced nephropathy. I discussed all risk and benefits and he wants to proceed with an angiogram and possible endovascular intervention. We'll plan on 4 hours of hydration and limit contrast use.  2. Coronary artery disease involving native coronary arteries without angina: Continue medical therapy.  3. Essential hypertension: Blood pressure is reasonably controlled on current medications.  4. Hyperlipidemia: Currently on rosuvastatin 20 mg daily. Most recent lipid profile showed an LDL of 67.    Disposition:   FU with me in 1 month  Signed,  Kathlyn Sacramento, MD  09/30/2019 5:15 PM    [place]

## 2019-09-28 NOTE — H&P (View-Only) (Signed)
Cardiology Office Note   Date:  09/30/2019   ID:  RAGAN REALE, DOB 19-Jan-1956, MRN [id number]  PCP:  Deland Pretty, MD  Cardiologist: Dr. Ellyn Hack.  No chief complaint on file.     History of Present Illness: Harold [name] is a 64 y.o. male who was referred by Dr. Ellyn Hack for evaluation and management of peripheral arterial disease.  He has known history of carotid disease, obstructive sleep apnea on CPAP, peripheral arterial disease, essential hypertension, obesity, chronic kidney disease, type 2 diabetes and hyperlipidemia.  He has known history of coronary artery disease with previous stenting.  He reports bilateral leg fatigue as well as discomfort in both calfs. Right is worse than left and it happens after walking less than 50 feet. He reports that his right foot turns purple. He reports inability to exercise due to severe pain. He has been diabetic since 1996. He is not a smoker. He underwent noninvasive vascular evaluation which showed an ABI of 0.72 on the right and 0.90 on the left. Duplex showed severe disease in the right TP trunk with occluded anterior tibial. On the left, the anterior tibial was occluded.  Past Medical History:  Diagnosis Date  . Anginal pain [facility]) March 2015   Cardiac cath showed patent stents with distal LAD, circumflex-OM and RCA disease in small vessels.  . Anxiety   . CAD S/P percutaneous coronary angioplasty 2003, 04/2012   status post PCI to LAD, circumflex-OM 2, RCA  . Carotid artery occlusion   . Chronic renal insufficiency, stage II (mild)   . Chronic venous insufficiency    varicosities, no reflux; dopplers 04/14/12- valvular insufficiency in the R and L GSV  . Complication of anesthesia   . COPD, mild ([place])   . Depression    situaltional   . Diabetes mellitus   . Diabetic neuropathy ([place]) 08/24/2019  . Dyslipidemia associated with type 2 diabetes mellitus ([place])   . GERD (gastroesophageal reflux disease)   . HTN  (hypertension)   . Hypothyroidism   . Neuromuscular disorder (HCC)    neuropathy in feet  . Neuropathy    notably improved following PCI with improved cardiac function  . Obesity   . 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 PONV (postoperative nausea and vomiting)    "[place]"  . Pulmonary hypertension ([place]) 04/2012   PA pressure 9mhg  . Sleep apnea    uses nightly    Past Surgical History:  Procedure Laterality Date  . ABDOMINAL AORTAGRAM N/A 11/15/2011   Procedure: ABDOMINAL AMaxcine Ham  Surgeon: CElam Dutch MD;  [facility];  Service: Cardiovascular;  [name];  . ABIs  04/27/2012   mild bilateral arterial insufficiency  . BACK SURGERY  2005 x1   X2-2010  . BUNIONECTOMY Right 11/11/2013   Procedure: RIGHT FOOT SILVER BUNIONECTOMY;  Surgeon: JWylene Simmer MD;  [place]  Service: Orthopedics;  Laterality: Right;  . CARDIAC CATHETERIZATION  12/11/2001   significant 3V CAD, normal LV function  . Carotid Doppler  04/27/2012   right internal carotid: Elevated velocities but no evidence of plaque. Left internal carotid 40-59%  . CAROTID ENDARTERECTOMY  2005   Right; recent carotid Dopplers notes elevated velocities.   . colonscopy    . CORONARY ANGIOPLASTY  12/21/2001   PTCA of the distal and mid AV groove circ, unsuccessful PTCA of  second OM total occlusion, unsuccessful PTCA of the apical LAD total occlusion  . CORONARY ANGIOPLASTY WITH STENT PLACEMENT  04/29/12   PCI to 3 RCA lesions, Promus Premiere 2.41mx8mm distally, mid was 2.530m8mm and proximally 2.75x1648mEF 55-60%  . CORONARY STENT PLACEMENT  04/28/12   PCI to LAD (3x23m13mence DES postdilated to 3.25) and circ prox and mid (2 overlappinmg 2.5mmx67mmXience DES postdilated to 2.75mm)64mDOPPLER ECHOCARDIOGRAPHY  04/28/2012   poor quality study: EF estimated 60-65%; unable to assess  diastolic function (previously noted to have diastolic dysfunction); severely dilated left atrium and mild right atrium; dilated IVC consistent with increased central venous pressure..  . HERNIA REPAIR    . LEFT AND RIGHT HEART CATHETERIZATION WITH CORONARY ANGIOGRAM N/A 04/27/2012   Procedure: LEFT AND RIGHT HEART CATHETERIZATION WITH CORONARY ANGIOGRAM;  Surgeon: David Leonie Man [facility];  Service: Cardiovascular;  [name];  . LEFT AND RIGHT HEART CATHETERIZATION WITH CORONARY ANGIOGRAM N/A 05/14/2013   Procedure: LEFT AND RIGHT HEART CATHETERIZATION WITH CORONARY ANGIOGRAM;  Surgeon: ThomasTroy Sine [facility] Moderate Pulm HTN: 46/16 - mean 33 mmHg; PCWP 22mmHg21[name], [name], Patent RCA ostial mid and distal stents - 70% distal runoff Dz  . LUMBAR LAMINECTOMY/DECOMPRESSION MICRODISCECTOMY  01/07/2012   Procedure: LUMBAR LAMINECTOMY/DECOMPRESSION MICRODISCECTOMY 1 LEVEL;  Surgeon: Kyle L Winfield CunasLocation: [facility];  Service: Neurosurgery;  Laterality: Bilateral;   Lumbar Three-Four Decompression  . PERCUTANEOUS CORONARY STENT INTERVENTION (PCI-S) N/A 04/28/2012   Procedure: PERCUTANEOUS CORONARY STENT INTERVENTION (PCI-S);  Surgeon: Thomas Troy Sine[facility];  Service: Cardiovascular;  [name];  . PERCUTANEOUS CORONARY STENT INTERVENTION (PCI-S) N/A 04/29/2012   Procedure: PERCUTANEOUS CORONARY STENT INTERVENTION (PCI-S);  Surgeon: David WLeonie Man[facility];  Service: Cardiovascular;  [name];  . SPINE SURGERY    . UMBILICAL HERNIA REPAIR  2009   steel mesh insert     Current Outpatient Medications  Medication Sig Dispense Refill  . allopurinol (ZYLOPRIM) 100 MG tablet Take 100 mg by mouth daily.  1  . aspirin EC 81 MG tablet Take 1 tablet (81 mg total) by mouth daily. 90 tablet 3  . carvedilol  (COREG) 6.25 MG tablet TAKE 1 TABLET (6.25 MG TOTAL) BY MOUTH 2 (TWO) TIMES DAILY WITH A MEAL. 180 tablet 2  . COLCRYS 0.6 MG tablet Take 0.6 mg by mouth daily.    . Continuous Blood Gluc Receiver (FREESTYLE LIBRE 14 DAY READER) DEVI FreeStyle Libre 14 Day Reader  USE AS DIRECTED    . Continuous Blood Gluc Sensor (FREESTYLE LIBRE 14 DAY SENSOR) MISC APPLY 1 SENSOR EVERY 14 DAYS  11  . empagliflozin (JARDIANCE) 25 MG TABS tablet Jardiance 25 mg tablet  TAKE 1 TABLET BY MOUTH EVERY DAY    . escitalopram (LEXAPRO) 20 MG tablet TAKE 1 TABLET (20 MG TOTAL) BY MOUTH DAILY. 30 tablet 1  . furosemide (LASIX) 80 MG tablet Take 80 mg by mouth 3 (three) times daily. TAKE 160 MG BY MOUTH 3 ( THREE) TIMES DAILY    . gabapentin (NEURONTIN) 300 MG capsule Take 400 mg by mouth in the morning, at noon, and at bedtime.     . insulin regular human CONCENTRATED (HUMULIN R U-500 KWIKPEN) 500 UNIT/ML kwikpen Inject 110 Units into the skin 3 (three) times  daily with meals.    Marland Kitchen levothyroxine (SYNTHROID, LEVOTHROID) 88 MCG tablet Take 88 mcg by mouth daily before breakfast.    . losartan (COZAAR) 25 MG tablet Take 1 tablet (25 mg total) by mouth daily. 90 tablet 2  . nitroGLYCERIN (NITROSTAT) 0.4 MG SL tablet PLACE 1 TAB UNDER THE TONGUE EVERY 5 MINUTES AS NEEDED FOR CHEST PAIN (SEVERE PRESSURE OR TIGHTNESS) 25 tablet 3  . omeprazole (PRILOSEC) 20 MG capsule TAKE 1 CAPSULE BY MOUTH EVERY DAY 90 capsule 3  . potassium chloride (MICRO-K) 10 MEQ CR capsule Take by mouth daily.  5  . rosuvastatin (CRESTOR) 20 MG tablet Take 10 mg by mouth daily.     . TRULICITY 1.5 ZJ/6.9CV SOPN Inject 4.5 mg into the skin once a week.   5  . XARELTO 2.5 MG TABS tablet TAKE 1 TABLET BY MOUTH TWICE A DAY 180 tablet 3   No current facility-administered medications for this visit.    Allergies:   Septra [bactrim], Penicillins, Clopidogrel, and Metformin and related    Social History:  The patient  reports that he quit smoking about 16  years ago. His smoking use included cigarettes. He has a 42.00 pack-year smoking history. He has never used smokeless tobacco. He reports that he does not drink alcohol and does not use drugs.   Family History:  The patient's He was adopted. Family history is unknown by patient.    ROS:  Please see the history of present illness.   Otherwise, review of systems are positive for none.   All other systems are reviewed and negative.    PHYSICAL EXAM: VS:  BP 140/70   Pulse 65   Temp (!) 93.7 F (34.3 C)   Ht _0  (1.854 m)   Wt (!) 323 lb 12.8 oz (146.9 kg)   SpO2 99%   BMI 42.72 kg/m  , BMI Body mass index is 42.72 kg/m. GEN: Well nourished, well developed, in no acute distress  HEENT: normal  Neck: no JVD, carotid bruits, or masses Cardiac: RRR; no murmurs, rubs, or gallops,no edema  Respiratory:  clear to auscultation bilaterally, normal work of breathing GI: soft, nontender, nondistended, + BS MS: no deformity or atrophy  Skin: warm and dry, no rash Neuro:  Strength and sensation are intact Psych: euthymic mood, full affect Vascular: Femoral pulses +1 bilaterally. Distal pulses are not palpable but he does have significant edema limiting the exam.   EKG:  EKG is not ordered today.    Recent Labs: 09/28/2019: BUN 25; Creatinine, Ser 1.73; Hemoglobin 15.8; Platelets 137; Potassium 4.0; Sodium 136    Lipid Panel    Component Value Date/Time   CHOL 178 05/12/2018 1150   TRIG 343 (H) 05/12/2018 1150   HDL 42 05/12/2018 1150   CHOLHDL 4.2 05/12/2018 1150   CHOLHDL 6.0 08/30/2014 1437   VLDL NOT CALC 08/30/2014 1437   LDLCALC 67 05/12/2018 1150      Wt Readings from Last 3 Encounters:  09/28/19 (!) 323 lb 12.8 oz (146.9 kg)  08/24/19 (!) 327 lb 8 oz (148.6 kg)  08/17/19 (!) 327 lb (148.3 kg)       No flowsheet data found.    ASSESSMENT AND PLAN:  1. Peripheral arterial disease with severe right leg claudication Rutherford class III. He has significant  stenosis in the right TP trunk but that might not really explain all of his symptoms. His femoral pulses are diminished and he might have inflow disease. Considering his  comorbidities, I have suggested trying an exercise walking program to improve his symptoms as well as continue treatment of his risk factors. The patient reports inability to walk even a short distance and he wants to proceed with intervention. I discussed my concern about his chronic kidney disease and risk of contrast-induced nephropathy. I discussed all risk and benefits and he wants to proceed with an angiogram and possible endovascular intervention. We'll plan on 4 hours of hydration and limit contrast use.  2. Coronary artery disease involving native coronary arteries without angina: Continue medical therapy.  3. Essential hypertension: Blood pressure is reasonably controlled on current medications.  4. Hyperlipidemia: Currently on rosuvastatin 20 mg daily. Most recent lipid profile showed an LDL of 67.    Disposition:   FU with me in 1 month  Signed,  Kathlyn Sacramento, MD  09/30/2019 5:15 PM    [place]

## 2019-09-29 LAB — BASIC METABOLIC PANEL
BUN/Creatinine Ratio: 14 (ref 10–24)
BUN: 25 mg/dL (ref 8–27)
CO2: 26 mmol/L (ref 20–29)
Calcium: 9.3 mg/dL (ref 8.6–10.2)
Chloride: 92 mmol/L — ABNORMAL LOW (ref 96–106)
Creatinine, Ser: 1.73 mg/dL — ABNORMAL HIGH (ref 0.76–1.27)
GFR calc Af Amer: 47 mL/min/{1.73_m2} — ABNORMAL LOW (ref >59)
GFR calc non Af Amer: 40 mL/min/{1.73_m2} — ABNORMAL LOW (ref >59)
Glucose: 419 mg/dL — ABNORMAL HIGH (ref 65–99)
Potassium: 4 mmol/L (ref 3.5–5.2)
Sodium: 136 mmol/L (ref 134–144)

## 2019-09-29 LAB — CBC
Hematocrit: 46.6 % (ref 37.5–51.0)
Hemoglobin: 15.8 g/dL (ref 13.0–17.7)
MCH: 31.5 pg (ref 26.6–33.0)
MCHC: 33.9 g/dL (ref 31.5–35.7)
MCV: 93 fL (ref 79–97)
Platelets: 137 10*3/uL — ABNORMAL LOW (ref 150–450)
RBC: 5.01 x10E6/uL (ref 4.14–5.80)
RDW: 13.8 % (ref 11.6–15.4)
WBC: 5.8 10*3/uL (ref 3.4–10.8)

## 2019-10-09 ENCOUNTER — Other Ambulatory Visit (HOSPITAL_COMMUNITY)
Admission: RE | Admit: 2019-10-09 | Discharge: 2019-10-09 | Disposition: A | Discharge status: Home / Self Care | Payer: Medicare Other | Source: Ambulatory Visit | Attending: Cardiovascular Disease | Admitting: Cardiovascular Disease

## 2019-10-09 DIAGNOSIS — Z01812 Encounter for preprocedural laboratory examination: Secondary | ICD-10-CM | POA: Insufficient documentation

## 2019-10-09 DIAGNOSIS — Z20822 Contact with and (suspected) exposure to covid-19: Secondary | ICD-10-CM | POA: Diagnosis not present

## 2019-10-09 LAB — SARS CORONAVIRUS 2 (TAT 6-24 HRS): SARS Coronavirus 2: NEGATIVE

## 2019-10-12 ENCOUNTER — Telehealth: Payer: Self-pay | Admitting: *Deleted

## 2019-10-12 ENCOUNTER — Telehealth: Payer: Self-pay | Admitting: Cardiovascular Disease

## 2019-10-12 NOTE — Telephone Encounter (Addendum)
Pt contacted pre-abdominal aortogram  scheduled at [facility] for: Wednesday October 13, 2019 10:30 AM Verified arrival time and place: [place] [facility]) at: 5:30 AM- 6 AM pre-procedure hydration   No solid food after midnight prior to cath, clear liquids until 5 AM day of procedure.  Hold: Xarelto-none 10/11/19 until post procedure Lasix/KCl-day before and day of procedure-GFR 40 Losartan-day before and day of procedure-GFR 54 Jardiance-AM of procedure Insulin-AM of procedure Trulicity-hold if takes on Wednesdays  Except hold medications AM meds can be  taken pre-cath with sips of water including: ASA 81 mg   Confirmed patient has responsible adult to drive home post procedure and observe 24 hours after arriving home:   You are allowed ONE visitor in the waiting room during the time you are at the hospital for your procedure. Both you and your visitor must wear a mask once you enter the hospital.       COVID-19 Pre-Screening Questions:  . In the past 10 days have you had a new cough, shortness of breath, headache, congestion, fever (100 or greater) unexplained body aches, new sore throat, or sudden loss of taste or sense of smell? Marland Kitchen In the past 10 days have you been around anyone with known Covid 19?  Marland Kitchen Have you been vaccinated for COVID-19?   LMTCB to review procedure instructions

## 2019-10-12 NOTE — Telephone Encounter (Signed)
Left a message for the patient to call back.  Per Webb Silversmith:  Pt contacted pre-abdominal aortogram  scheduled at [facility] for: Wednesday October 13, 2019 10:30 AM Verified arrival time and place: [place] [facility]) at: 5:30 AM- 6 AM pre-procedure hydration   No solid food after midnight prior to cath, clear liquids until 5 AM day of procedure.  Hold: Xarelto-none 10/11/19 until post procedure Lasix/KCl-day before and day of procedure-GFR 40 Losartan-day before and day of procedure-GFR 45 Jardiance-AM of procedure Insulin-AM of procedure Trulicity-hold if takes on Wednesdays  Except hold medications AM meds can be  taken pre-cath with sips of water including: ASA 81 mg   Confirmed patient has responsible adult to drive home post procedure and observe 24 hours after arriving home:   You are allowed ONE visitor in the waiting room during the time you are at the hospital for your procedure. Both you and your visitor must wear a mask once you enter the hospital.       COVID-19 Pre-Screening Questions:   In the past 10 days have you had a new cough, shortness of breath, headache, congestion, fever (100 or greater) unexplained body aches, new sore throat, or sudden loss of taste or sense of smell?  In the past 10 days have you been around anyone with known Covid 19?   Have you been vaccinated for COVID-19?

## 2019-10-12 NOTE — Telephone Encounter (Signed)
Patient returning phone call

## 2019-10-12 NOTE — Telephone Encounter (Signed)
The patient has been made aware and verbalized their understanding,

## 2019-10-12 NOTE — Telephone Encounter (Signed)
No answer, voicemail message. 

## 2019-10-13 ENCOUNTER — Other Ambulatory Visit: Payer: Self-pay

## 2019-10-13 ENCOUNTER — Ambulatory Visit (HOSPITAL_COMMUNITY)
Admission: RE | Disposition: A | Discharge status: Home / Self Care | Payer: Medicare Other | Source: Home / Self Care | Attending: Cardiovascular Disease

## 2019-10-13 ENCOUNTER — Ambulatory Visit (HOSPITAL_COMMUNITY)
Admission: RE | Admit: 2019-10-13 | Discharge: 2019-10-13 | Disposition: A | Discharge status: Home / Self Care | Payer: Medicare Other | Attending: Cardiovascular Disease | Admitting: Cardiovascular Disease

## 2019-10-13 ENCOUNTER — Other Ambulatory Visit: Payer: Self-pay | Admitting: *Deleted

## 2019-10-13 DIAGNOSIS — F329 Major depressive disorder, single episode, unspecified: Secondary | ICD-10-CM | POA: Insufficient documentation

## 2019-10-13 DIAGNOSIS — Z881 Allergy status to other antibiotic agents status: Secondary | ICD-10-CM | POA: Insufficient documentation

## 2019-10-13 DIAGNOSIS — Z882 Allergy status to sulfonamides status: Secondary | ICD-10-CM | POA: Insufficient documentation

## 2019-10-13 DIAGNOSIS — I272 Pulmonary hypertension, unspecified: Secondary | ICD-10-CM | POA: Diagnosis not present

## 2019-10-13 DIAGNOSIS — E039 Hypothyroidism, unspecified: Secondary | ICD-10-CM | POA: Insufficient documentation

## 2019-10-13 DIAGNOSIS — G4733 Obstructive sleep apnea (adult) (pediatric): Secondary | ICD-10-CM | POA: Diagnosis not present

## 2019-10-13 DIAGNOSIS — E785 Hyperlipidemia, unspecified: Secondary | ICD-10-CM | POA: Insufficient documentation

## 2019-10-13 DIAGNOSIS — I70211 Atherosclerosis of native arteries of extremities with intermittent claudication, right leg: Secondary | ICD-10-CM | POA: Insufficient documentation

## 2019-10-13 DIAGNOSIS — E1140 Type 2 diabetes mellitus with diabetic neuropathy, unspecified: Secondary | ICD-10-CM | POA: Insufficient documentation

## 2019-10-13 DIAGNOSIS — Z79890 Hormone replacement therapy (postmenopausal): Secondary | ICD-10-CM | POA: Insufficient documentation

## 2019-10-13 DIAGNOSIS — K219 Gastro-esophageal reflux disease without esophagitis: Secondary | ICD-10-CM | POA: Insufficient documentation

## 2019-10-13 DIAGNOSIS — E1122 Type 2 diabetes mellitus with diabetic chronic kidney disease: Secondary | ICD-10-CM | POA: Insufficient documentation

## 2019-10-13 DIAGNOSIS — E669 Obesity, unspecified: Secondary | ICD-10-CM | POA: Diagnosis not present

## 2019-10-13 DIAGNOSIS — Z87891 Personal history of nicotine dependence: Secondary | ICD-10-CM | POA: Insufficient documentation

## 2019-10-13 DIAGNOSIS — Z7901 Long term (current) use of anticoagulants: Secondary | ICD-10-CM | POA: Insufficient documentation

## 2019-10-13 DIAGNOSIS — I6529 Occlusion and stenosis of unspecified carotid artery: Secondary | ICD-10-CM | POA: Insufficient documentation

## 2019-10-13 DIAGNOSIS — G4730 Sleep apnea, unspecified: Secondary | ICD-10-CM | POA: Diagnosis not present

## 2019-10-13 DIAGNOSIS — N182 Chronic kidney disease, stage 2 (mild): Secondary | ICD-10-CM | POA: Diagnosis not present

## 2019-10-13 DIAGNOSIS — Z880 Allergy status to penicillin: Secondary | ICD-10-CM | POA: Insufficient documentation

## 2019-10-13 DIAGNOSIS — Z7982 Long term (current) use of aspirin: Secondary | ICD-10-CM | POA: Diagnosis not present

## 2019-10-13 DIAGNOSIS — Z6841 Body Mass Index (BMI) 40.0 and over, adult: Secondary | ICD-10-CM | POA: Insufficient documentation

## 2019-10-13 DIAGNOSIS — I739 Peripheral vascular disease, unspecified: Secondary | ICD-10-CM

## 2019-10-13 DIAGNOSIS — F419 Anxiety disorder, unspecified: Secondary | ICD-10-CM | POA: Insufficient documentation

## 2019-10-13 DIAGNOSIS — E1151 Type 2 diabetes mellitus with diabetic peripheral angiopathy without gangrene: Secondary | ICD-10-CM | POA: Diagnosis not present

## 2019-10-13 DIAGNOSIS — J449 Chronic obstructive pulmonary disease, unspecified: Secondary | ICD-10-CM | POA: Diagnosis not present

## 2019-10-13 DIAGNOSIS — I129 Hypertensive chronic kidney disease with stage 1 through stage 4 chronic kidney disease, or unspecified chronic kidney disease: Secondary | ICD-10-CM | POA: Insufficient documentation

## 2019-10-13 DIAGNOSIS — Z79899 Other long term (current) drug therapy: Secondary | ICD-10-CM | POA: Insufficient documentation

## 2019-10-13 DIAGNOSIS — Z955 Presence of coronary angioplasty implant and graft: Secondary | ICD-10-CM | POA: Diagnosis not present

## 2019-10-13 DIAGNOSIS — Z794 Long term (current) use of insulin: Secondary | ICD-10-CM | POA: Insufficient documentation

## 2019-10-13 DIAGNOSIS — I2510 Atherosclerotic heart disease of native coronary artery without angina pectoris: Secondary | ICD-10-CM | POA: Insufficient documentation

## 2019-10-13 HISTORY — PX: PERIPHERAL VASCULAR BALLOON ANGIOPLASTY: CATH118281

## 2019-10-13 HISTORY — PX: ABDOMINAL AORTOGRAM W/LOWER EXTREMITY: CATH118223

## 2019-10-13 LAB — GLUCOSE, CAPILLARY
Glucose-Capillary: 150 mg/dL — ABNORMAL HIGH (ref 70–99)
Glucose-Capillary: 158 mg/dL — ABNORMAL HIGH (ref 70–99)

## 2019-10-13 LAB — POCT ACTIVATED CLOTTING TIME: Activated Clotting Time: 263 seconds

## 2019-10-13 SURGERY — ABDOMINAL AORTOGRAM W/LOWER EXTREMITY
Anesthesia: LOCAL | Laterality: Right

## 2019-10-13 MED ORDER — SODIUM CHLORIDE 0.9 % IV SOLN: INTRAVENOUS | Status: DC

## 2019-10-13 MED ORDER — SODIUM CHLORIDE 0.9 % IV SOLN: 250.0000 mL | INTRAVENOUS | Status: DC | PRN

## 2019-10-13 MED ORDER — HEPARIN SODIUM (PORCINE) 1000 UNIT/ML IJ SOLN
INTRAMUSCULAR | Status: DC | PRN
  Administered 2019-10-13: 12000 [IU] via INTRAVENOUS

## 2019-10-13 MED ORDER — HEPARIN SODIUM (PORCINE) 1000 UNIT/ML IJ SOLN
INTRAMUSCULAR | Status: AC
  Filled 2019-10-13: qty 1

## 2019-10-13 MED ORDER — SODIUM CHLORIDE 0.9% FLUSH: 3.0000 mL | INTRAVENOUS | Status: DC | PRN

## 2019-10-13 MED ORDER — MIDAZOLAM HCL 2 MG/2ML IJ SOLN
INTRAMUSCULAR | Status: DC | PRN
  Administered 2019-10-13 (×2): 1 mg via INTRAVENOUS

## 2019-10-13 MED ORDER — ASPIRIN 81 MG PO CHEW
81.0000 mg | CHEWABLE_TABLET | ORAL | Status: AC
  Administered 2019-10-13: 81 mg via ORAL
  Filled 2019-10-13: qty 1

## 2019-10-13 MED ORDER — IODIXANOL 320 MG/ML IV SOLN
INTRAVENOUS | Status: DC | PRN
  Administered 2019-10-13: 120 mL via INTRA_ARTERIAL

## 2019-10-13 MED ORDER — SODIUM CHLORIDE 0.9% FLUSH: 3.0000 mL | Freq: Two times a day (BID) | INTRAVENOUS | Status: DC

## 2019-10-13 MED ORDER — FENTANYL CITRATE (PF) 100 MCG/2ML IJ SOLN
INTRAMUSCULAR | Status: AC
  Filled 2019-10-13: qty 2

## 2019-10-13 MED ORDER — HEPARIN (PORCINE) IN NACL 1000-0.9 UT/500ML-% IV SOLN
INTRAVENOUS | Status: DC | PRN
  Administered 2019-10-13 (×2): 500 mL

## 2019-10-13 MED ORDER — MIDAZOLAM HCL 5 MG/5ML IJ SOLN
INTRAMUSCULAR | Status: AC
  Filled 2019-10-13: qty 5

## 2019-10-13 MED ORDER — FENTANYL CITRATE (PF) 100 MCG/2ML IJ SOLN
INTRAMUSCULAR | Status: DC | PRN
Start: 2019-10-13 — End: 2019-10-13
  Administered 2019-10-13 (×2): 50 ug via INTRAVENOUS

## 2019-10-13 MED ORDER — CLOPIDOGREL BISULFATE 300 MG PO TABS
ORAL_TABLET | ORAL | Status: AC
  Filled 2019-10-13: qty 1

## 2019-10-13 MED ORDER — CLOPIDOGREL BISULFATE 75 MG PO TABS: 75.0000 mg | ORAL_TABLET | Freq: Every day | ORAL | 3 refills | Status: DC

## 2019-10-13 MED ORDER — CLOPIDOGREL BISULFATE 300 MG PO TABS
ORAL_TABLET | ORAL | Status: DC | PRN
  Administered 2019-10-13: 600 mg via ORAL

## 2019-10-13 MED ORDER — LIDOCAINE HCL (PF) 1 % IJ SOLN
INTRAMUSCULAR | Status: DC | PRN
  Administered 2019-10-13: 15 mL via INTRADERMAL

## 2019-10-13 MED ORDER — LIDOCAINE HCL (PF) 1 % IJ SOLN
INTRAMUSCULAR | Status: AC
  Filled 2019-10-13: qty 30

## 2019-10-13 MED ORDER — HEPARIN (PORCINE) IN NACL 1000-0.9 UT/500ML-% IV SOLN
INTRAVENOUS | Status: AC
  Filled 2019-10-13: qty 1000

## 2019-10-13 SURGICAL SUPPLY — 22 items
BALLN JADE .014 3.5 X 100 (BALLOONS) ×3
BALLOON JADE .014 3.5 X 100 (BALLOONS) ×2 IMPLANT
CATH ANGIO 5F PIGTAIL 65CM (CATHETERS) ×3 IMPLANT
CATH CROSS OVER TEMPO 5F (CATHETERS) ×3 IMPLANT
CATH STRAIGHT 5FR 65CM (CATHETERS) ×3 IMPLANT
DCB RANGER 4.0X40 135 (BALLOONS) ×2 IMPLANT
DEVICE CLOSURE PERCLS PRGLD 6F (VASCULAR PRODUCTS) ×2 IMPLANT
GLIDEWIRE ADV .035X180CM (WIRE) ×3 IMPLANT
KIT ENCORE 26 ADVANTAGE (KITS) ×3 IMPLANT
KIT MICROPUNCTURE NIT STIFF (SHEATH) ×3 IMPLANT
KIT PV (KITS) ×3 IMPLANT
PERCLOSE PROGLIDE 6F (VASCULAR PRODUCTS) ×3
RANGER DCB 4.0X40 135 (BALLOONS) ×3
SHEATH PINNACLE 5F 10CM (SHEATH) ×3 IMPLANT
SHEATH PINNACLE ST 6F 65CM (SHEATH) ×3 IMPLANT
SYR MEDRAD MARK 7 150ML (SYRINGE) ×3 IMPLANT
TAPE VIPERTRACK RADIOPAQ (MISCELLANEOUS) ×2 IMPLANT
TAPE VIPERTRACK RADIOPAQUE (MISCELLANEOUS) ×3
TRANSDUCER W/STOPCOCK (MISCELLANEOUS) ×3 IMPLANT
TRAY PV CATH (CUSTOM PROCEDURE TRAY) ×3 IMPLANT
WIRE HITORQ VERSACORE ST 145CM (WIRE) ×6 IMPLANT
WIRE RUNTHROUGH .014X300CM (WIRE) ×3 IMPLANT

## 2019-10-13 NOTE — Interval H&P Note (Signed)
History and Physical Interval Note:  10/13/2019 10:22 AM  Harold [name]  has presented today for surgery, with the diagnosis of pad.  The various methods of treatment have been discussed with the patient and family. After consideration of risks, benefits and other options for treatment, the patient has consented to  Procedure(s): ABDOMINAL AORTOGRAM W/LOWER EXTREMITY (N/A) as a surgical intervention.  The patient's history has been reviewed, patient examined, no change in status, stable for surgery.  I have reviewed the patient's chart and labs.  Questions were answered to the patient's satisfaction.     Kathlyn Sacramento

## 2019-10-13 NOTE — Telephone Encounter (Signed)
Pt currently at hospital for procedure.

## 2019-10-13 NOTE — Discharge Instructions (Signed)
Femoral Site Care This sheet gives you information about how to care for yourself after your procedure. Your health care provider may also give you more specific instructions. If you have problems or questions, contact your health care provider. What can I expect after the procedure?  After the procedure, it is common to have:  Bruising that usually fades within 1-2 weeks.  Tenderness at the site. Follow these instructions at home: Wound care 1. Follow instructions from your health care provider about how to take care of your insertion site. Make sure you: ? Wash your hands with soap and water before you change your bandage (dressing). If soap and water are not available, use hand sanitizer. ? Remove your dressing as told by your health care provider. In 24 hours 2. Do not take baths, swim, or use a hot tub until your health care provider approves. 3. You may shower 24-48 hours after the procedure or as told by your health care provider. ? Gently wash the site with plain soap and water. ? Pat the area dry with a clean towel. ? Do not rub the site. This may cause bleeding. 4. Do not apply powder or lotion to the site. Keep the site clean and dry. 5. Check your femoral site every day for signs of infection. Check for: ? Redness, swelling, or pain. ? Fluid or blood. ? Warmth. ? Pus or a bad smell. Activity 1. For the first 2-3 days after your procedure, or as long as directed: ? Avoid climbing stairs as much as possible. ? Do not squat. 2. Do not lift anything that is heavier than 10 lb (4.5 kg), or the limit that you are told, until your health care provider says that it is safe. For 5 days 3. Rest as directed. ? Avoid sitting for a long time without moving. Get up to take short walks every 1-2 hours. 4. Do not drive for 24 hours if you were given a medicine to help you relax (sedative). General instructions  Take over-the-counter and prescription medicines only as told by your  health care provider.  Keep all follow-up visits as told by your health care provider. This is important. Contact a health care provider if you have:  A fever or chills.  You have redness, swelling, or pain around your insertion site. Get help right away if:  The catheter insertion area swells very fast.  You pass out.  You suddenly start to sweat or your skin gets clammy.  The catheter insertion area is bleeding, and the bleeding does not stop when you hold steady pressure on the area.  The area near or just beyond the catheter insertion site becomes pale, cool, tingly, or numb. These symptoms may represent a serious problem that is an emergency. Do not wait to see if the symptoms will go away. Get medical help right away. Call your local emergency services (911 in the [place] Do not drive yourself to the hospital. Summary  After the procedure, it is common to have bruising that usually fades within 1-2 weeks.  Check your femoral site every day for signs of infection.  Do not lift anything that is heavier than 10 lb (4.5 kg), or the limit that you are told, until your health care provider says that it is safe. This information is not intended to replace advice given to you by your health care provider. Make sure you discuss any questions you have with your health care provider. Document Revised: 02/17/2017 Document Reviewed: 02/17/2017  Elsevier Patient Education  [employer].

## 2019-10-14 ENCOUNTER — Encounter (HOSPITAL_COMMUNITY): Payer: Self-pay | Admitting: Cardiovascular Disease

## 2019-10-14 MED FILL — Midazolam HCl Inj 5 MG/5ML (Base Equivalent): INTRAMUSCULAR | Qty: 2 | Status: AC

## 2019-10-26 ENCOUNTER — Other Ambulatory Visit: Payer: Self-pay | Admitting: Cardiovascular Disease

## 2019-10-26 NOTE — Telephone Encounter (Signed)
Refill request

## 2019-10-29 ENCOUNTER — Other Ambulatory Visit (HOSPITAL_COMMUNITY): Payer: Self-pay | Admitting: Cardiovascular Disease

## 2019-10-29 ENCOUNTER — Ambulatory Visit (HOSPITAL_COMMUNITY)
Admission: RE | Admit: 2019-10-29 | Discharge: 2019-10-29 | Disposition: A | Discharge status: Home / Self Care | Payer: Medicare Other | Source: Ambulatory Visit | Attending: Cardiology | Admitting: Cardiology

## 2019-10-29 ENCOUNTER — Other Ambulatory Visit: Payer: Self-pay

## 2019-10-29 DIAGNOSIS — Z9862 Peripheral vascular angioplasty status: Secondary | ICD-10-CM

## 2019-10-29 DIAGNOSIS — I739 Peripheral vascular disease, unspecified: Secondary | ICD-10-CM | POA: Diagnosis not present

## 2019-11-04 DIAGNOSIS — Z23 Encounter for immunization: Secondary | ICD-10-CM | POA: Diagnosis not present

## 2019-11-04 DIAGNOSIS — R0789 Other chest pain: Secondary | ICD-10-CM | POA: Diagnosis not present

## 2019-11-08 DIAGNOSIS — N183 Chronic kidney disease, stage 3 unspecified: Secondary | ICD-10-CM | POA: Diagnosis not present

## 2019-11-09 ENCOUNTER — Encounter: Payer: Self-pay | Admitting: Cardiovascular Disease

## 2019-11-09 ENCOUNTER — Other Ambulatory Visit: Payer: Self-pay

## 2019-11-09 ENCOUNTER — Ambulatory Visit (INDEPENDENT_AMBULATORY_CARE_PROVIDER_SITE_OTHER): Payer: Medicare Other | Admitting: Cardiovascular Disease

## 2019-11-09 VITALS — BP 152/70 | HR 69 | Ht 73.0 in | Wt 316.6 lb

## 2019-11-09 DIAGNOSIS — I10 Essential (primary) hypertension: Secondary | ICD-10-CM | POA: Diagnosis not present

## 2019-11-09 DIAGNOSIS — E785 Hyperlipidemia, unspecified: Secondary | ICD-10-CM

## 2019-11-09 DIAGNOSIS — I739 Peripheral vascular disease, unspecified: Secondary | ICD-10-CM | POA: Diagnosis not present

## 2019-11-09 DIAGNOSIS — I2510 Atherosclerotic heart disease of native coronary artery without angina pectoris: Secondary | ICD-10-CM | POA: Diagnosis not present

## 2019-11-09 MED ORDER — CARVEDILOL 12.5 MG PO TABS
12.5000 mg | ORAL_TABLET | Freq: Two times a day (BID) | ORAL | 1 refills | Status: DC
Start: 2019-11-09 — End: 2020-05-01

## 2019-11-09 NOTE — Addendum Note (Signed)
Addended by: Ricci Barker on: 11/09/2019 09:18 AM   Modules accepted: Orders

## 2019-11-09 NOTE — Progress Notes (Signed)
Cardiology Office Note   Date:  11/09/2019   ID:  Harold [name], DOB 1955-11-12, MRN [id number]  PCP:  Deland Pretty, MD  Cardiologist: Dr. Ellyn Hack.  No chief complaint on file.     History of Present Illness: Harold [name] is a 64 y.o. male who is here today for follow-up visit regarding peripheral arterial disease.    He has known history of carotid disease, obstructive sleep apnea on CPAP, peripheral arterial disease, essential hypertension, obesity, chronic kidney disease, type 2 diabetes and hyperlipidemia.  He has known history of coronary artery disease with previous stenting.  He is not a smoker. He was seen recently for bilateral calf claudication worse on the right. He underwent noninvasive vascular evaluation which showed an ABI of 0.72 on the right and 0.90 on the left. Duplex showed severe disease in the right TP trunk with occluded anterior tibial. On the left, the anterior tibial was occluded. I proceeded with angiography last month which showed mild nonobstructive iliac disease.  Imaging of the right lower extremity showed no obstructive disease affecting the SFA or popliteal artery.  There was significant calcified stenosis in the TP trunk with occluded anterior tibial artery and significant disease in the proximal posterior tibial artery which was the dominant vessel below the knee.  I performed successful balloon angioplasty of the right TP trunk into the posterior tibial artery followed by drug-coated balloon angioplasty to the TP trunk. Post procedure vascular studies showed an ABI of 0.9 on the right and 0.99 on the left.  Normal velocities were noted in the right TP trunk and posterior tibial artery. He reports significant improvement in right calf claudication.  No chest pain or shortness of breath.  Past Medical History:  Diagnosis Date  . Anginal pain [facility]) March 2015   Cardiac cath showed patent stents with distal LAD, circumflex-OM and RCA  disease in small vessels.  . Anxiety   . CAD S/P percutaneous coronary angioplasty 2003, 04/2012   status post PCI to LAD, circumflex-OM 2, RCA  . Carotid artery occlusion   . Chronic renal insufficiency, stage II (mild)   . Chronic venous insufficiency    varicosities, no reflux; dopplers 04/14/12- valvular insufficiency in the R and L GSV  . Complication of anesthesia   . COPD, mild ([place])   . Depression    situaltional   . Diabetes mellitus   . Diabetic neuropathy ([place]) 08/24/2019  . Dyslipidemia associated with type 2 diabetes mellitus ([place])   . GERD (gastroesophageal reflux disease)   . HTN (hypertension)   . Hypothyroidism   . Neuromuscular disorder (HCC)    neuropathy in feet  . Neuropathy    notably improved following PCI with improved cardiac function  . Obesity   . 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 PONV (postoperative nausea and vomiting)    "[place]"  . Pulmonary hypertension ([place]) 04/2012   PA pressure 66mhg  . Sleep apnea    uses nightly    Past Surgical History:  Procedure Laterality Date  . ABDOMINAL AORTAGRAM N/A 11/15/2011   Procedure: ABDOMINAL AMaxcine Ham  Surgeon: CElam Dutch MD;  [facility];  Service: Cardiovascular;  [name];  . ABDOMINAL AORTOGRAM W/LOWER EXTREMITY N/A 10/13/2019   Procedure: ABDOMINAL AORTOGRAM W/LOWER EXTREMITY;  Surgeon: AWellington Hampshire MD;  [facility];  Service: Cardiovascular;  Laterality:  N/A;  . ABIs  04/27/2012   mild bilateral arterial insufficiency  . BACK SURGERY  2005 x1   X2-2010  . BUNIONECTOMY Right 11/11/2013   Procedure: RIGHT FOOT SILVER BUNIONECTOMY;  Surgeon: Wylene Simmer, MD;  Location: [place];  Service: Orthopedics;  Laterality: Right;  . CARDIAC CATHETERIZATION  12/11/2001   significant 3V CAD, normal LV function  . Carotid Doppler  04/27/2012   right internal  carotid: Elevated velocities but no evidence of plaque. Left internal carotid 40-59%  . CAROTID ENDARTERECTOMY  2005   Right; recent carotid Dopplers notes elevated velocities.   . colonscopy    . CORONARY ANGIOPLASTY  12/21/2001   PTCA of the distal and mid AV groove circ, unsuccessful PTCA of second OM total occlusion, unsuccessful PTCA of the apical LAD total occlusion  . CORONARY ANGIOPLASTY WITH STENT PLACEMENT  04/29/12   PCI to 3 RCA lesions, Promus Premiere 2.88mx8mm distally, mid was 2.581m8mm and proximally 2.75x1651mEF 55-60%  . CORONARY STENT PLACEMENT  04/28/12   PCI to LAD (3x23m61mence DES postdilated to 3.25) and circ prox and mid (2 overlappinmg 2.5mmx50mmXience DES postdilated to 2.75mm)61mDOPPLER ECHOCARDIOGRAPHY  04/28/2012   poor quality study: EF estimated 60-65%; unable to assess diastolic function (previously noted to have diastolic dysfunction); severely dilated left atrium and mild right atrium; dilated IVC consistent with increased central venous pressure..  . HERNIA REPAIR    . LEFT AND RIGHT HEART CATHETERIZATION WITH CORONARY ANGIOGRAM N/A 04/27/2012   Procedure: LEFT AND RIGHT HEART CATHETERIZATION WITH CORONARY ANGIOGRAM;  Surgeon: David Leonie Man [facility];  Service: Cardiovascular;  [name];  . LEFT AND RIGHT HEART CATHETERIZATION WITH CORONARY ANGIOGRAM N/A 05/14/2013   Procedure: LEFT AND RIGHT HEART CATHETERIZATION WITH CORONARY ANGIOGRAM;  Surgeon: ThomasTroy Sine [facility]: Moderate Pulm HTN: 46/16 - mean 33 mmHg; PCWP 22mmHg105[name], [name], Patent RCA ostial mid and distal stents - 70% distal runoff Dz  . LUMBAR LAMINECTOMY/DECOMPRESSION MICRODISCECTOMY  01/07/2012   Procedure: LUMBAR LAMINECTOMY/DECOMPRESSION MICRODISCECTOMY 1 LEVEL;  Surgeon: Kyle L Winfield CunasLocation: [facility];  Service: Neurosurgery;   Laterality: Bilateral;   Lumbar Three-Four Decompression  . PERCUTANEOUS CORONARY STENT INTERVENTION (PCI-S) N/A 04/28/2012   Procedure: PERCUTANEOUS CORONARY STENT INTERVENTION (PCI-S);  Surgeon: Thomas Troy Sine[facility];  Service: Cardiovascular;  [name];  . PERCUTANEOUS CORONARY STENT INTERVENTION (PCI-S) N/A 04/29/2012   Procedure: PERCUTANEOUS CORONARY STENT INTERVENTION (PCI-S);  Surgeon: David WLeonie Man[facility];  Service: Cardiovascular;  [name];  . PERIPHERAL VASCULAR BALLOON ANGIOPLASTY Right 10/13/2019   Procedure: PERIPHERAL VASCULAR BALLOON ANGIOPLASTY;  Surgeon: Donal Lynam, Wellington HampshireLocation[facility];  Service: Cardiovascular;  Laterality: Right;  . SPINE SURGERY    . UMBILICAL HERNIA REPAIR  2009   steel mesh insert     Current Outpatient Medications  Medication Sig Dispense Refill  . DULoxetine (CYMBALTA) 60 MG capsule Take 60 mg by mouth daily.    . gabapMarland Kitchenntin (NEURONTIN) 600 MG tablet Take 200 mg by mouth 3 (three) times daily.    . rosuvastatin (CRESTOR) 10 MG tablet Take 10 mg by mouth daily.    . allopMarland Kitchenrinol (ZYLOPRIM) 100 MG tablet Take 100 mg by mouth 2 (two) times daily.   1  .  aspirin EC 81 MG tablet Take 1 tablet (81 mg total) by mouth daily. 90 tablet 3  . carvedilol (COREG) 6.25 MG tablet TAKE 1 TABLET (6.25 MG TOTAL) BY MOUTH 2 (TWO) TIMES DAILY WITH A MEAL. 180 tablet 2  . clopidogrel (PLAVIX) 75 MG tablet TAKE 1 TABLET BY MOUTH EVERY DAY 90 tablet 1  . COLCRYS 0.6 MG tablet Take 0.6 mg by mouth daily.    . Continuous Blood Gluc Receiver (FREESTYLE LIBRE 14 DAY READER) DEVI FreeStyle Libre 14 Day Reader  USE AS DIRECTED    . Continuous Blood Gluc Sensor (FREESTYLE LIBRE 14 DAY SENSOR) MISC APPLY 1 SENSOR EVERY 14 DAYS  11  . empagliflozin (JARDIANCE) 25 MG TABS tablet Take 25 mg by mouth daily.     Marland Kitchen escitalopram (LEXAPRO) 20 MG tablet TAKE 1 TABLET (20 MG TOTAL) BY MOUTH DAILY. (Patient taking  differently: Take 20 mg by mouth daily. ) 30 tablet 1  . furosemide (LASIX) 80 MG tablet Take 240 mg by mouth 2 (two) times daily.     . insulin regular human CONCENTRATED (HUMULIN R U-500 KWIKPEN) 500 UNIT/ML kwikpen Inject 110 Units into the skin 2 (two) times daily with a meal.     . levothyroxine (SYNTHROID, LEVOTHROID) 88 MCG tablet Take 88 mcg by mouth daily before breakfast.    . losartan (COZAAR) 25 MG tablet Take 1 tablet (25 mg total) by mouth daily. 90 tablet 2  . nitroGLYCERIN (NITROSTAT) 0.4 MG SL tablet PLACE 1 TAB UNDER THE TONGUE EVERY 5 MINUTES AS NEEDED FOR CHEST PAIN (SEVERE PRESSURE OR TIGHTNESS) (Patient taking differently: Place 0.4 mg under the tongue every 5 (five) minutes as needed for chest pain. ) 25 tablet 3  . omeprazole (PRILOSEC) 20 MG capsule TAKE 1 CAPSULE BY MOUTH EVERY DAY (Patient taking differently: Take 20 mg by mouth daily. ) 90 capsule 3  . potassium chloride (MICRO-K) 10 MEQ CR capsule Take 10 mEq by mouth daily.   5  . TRULICITY 4.5 OV/5.6EP SOPN Inject 4.5 mg into the skin once a week.   5   No current facility-administered medications for this visit.    Allergies:   Septra [bactrim], Penicillins, Clopidogrel, and Metformin and related    Social History:  The patient  reports that he quit smoking about 16 years ago. His smoking use included cigarettes. He has a 42.00 pack-year smoking history. He has never used smokeless tobacco. He reports that he does not drink alcohol and does not use drugs.   Family History:  The patient's He was adopted. Family history is unknown by patient.    ROS:  Please see the history of present illness.   Otherwise, review of systems are positive for none.   All other systems are reviewed and negative.    PHYSICAL EXAM: VS:  BP (!) 152/70   Pulse 69   Ht _0  (1.854 m)   Wt (!) 316 lb 9.6 oz (143.6 kg)   SpO2 97%   BMI 41.77 kg/m  , BMI Body mass index is 41.77 kg/m. GEN: Well nourished, well developed, in no  acute distress  HEENT: normal  Neck: no JVD, carotid bruits, or masses Cardiac: RRR; no murmurs, rubs, or gallops,no edema  Respiratory:  clear to auscultation bilaterally, normal work of breathing GI: soft, nontender, nondistended, + BS MS: no deformity or atrophy  Skin: warm and dry, no rash Neuro:  Strength and sensation are intact Psych: euthymic mood, full affect Vascular: Femoral pulses +  1 bilaterally.  Posterior tibial pulses nonpalpable bilaterally.  No groin hematoma.   EKG:  EKG is ordered today. EKG showed normal sinus rhythm with sinus arrhythmia and nonspecific T wave changes.   Recent Labs: 09/28/2019: BUN 25; Creatinine, Ser 1.73; Hemoglobin 15.8; Platelets 137; Potassium 4.0; Sodium 136    Lipid Panel    Component Value Date/Time   CHOL 178 05/12/2018 1150   TRIG 343 (H) 05/12/2018 1150   HDL 42 05/12/2018 1150   CHOLHDL 4.2 05/12/2018 1150   CHOLHDL 6.0 08/30/2014 1437   VLDL NOT CALC 08/30/2014 1437   LDLCALC 67 05/12/2018 1150      Wt Readings from Last 3 Encounters:  11/09/19 (!) 316 lb 9.6 oz (143.6 kg)  10/13/19 (!) 323 lb (146.5 kg)  09/28/19 (!) 323 lb 12.8 oz (146.9 kg)       No flowsheet data found.    ASSESSMENT AND PLAN:  1. Peripheral arterial disease: Status post recent angioplasty to the right TP trunk and posterior tibial artery for severe claudication with almost complete resolution of symptoms.  He continues to have some numbness related peripheral neuropathy but no true claudication.  I do not think he has significant obstructive disease on the left side to require revascularization at this time.   Continue aggressive medical therapy. I will keep him on dual antiplatelet therapy for now until his follow-up Doppler in 6 months.  At that time, we have the option of switching clopidogrel back to low-dose Xarelto.  2. Coronary artery disease involving native coronary arteries without angina: Continue medical therapy.  3. Essential  hypertension: Blood pressure is elevated today and overall his systolic blood pressure has not been below 130 for a while.  I elected to increase carvedilol to 12.5 mg twice daily.  4. Hyperlipidemia: Currently on rosuvastatin 20 mg daily. Most recent lipid profile showed an LDL of 67.    Disposition:   FU with me in 6 months  Signed,  Kathlyn Sacramento, MD  11/09/2019 8:42 AM    [place]

## 2019-11-09 NOTE — Patient Instructions (Signed)
Medication Instructions:  INCREASE the Carvedilol to 12.5 mg twice daily  *If you need a refill on your cardiac medications before your next appointment, please call your pharmacy*   Lab Work: None ordered If you have labs (blood work) drawn today and your tests are completely normal, you will receive your results only by: Marland Kitchen MyChart Message (if you have MyChart) OR . A paper copy in the mail If you have any lab test that is abnormal or we need to change your treatment, we will call you to review the results.   Testing/Procedures: Your physician has requested that you have an ankle brachial index (ABI) in 6 months. During this test an ultrasound and blood pressure cuff are used to evaluate the arteries that supply the arms and legs with blood. Allow thirty minutes for this exam. There are no restrictions or special instructions. This will take place at [place], [address].   Your physician has requested that you have a lower extremity arterial duplex in 6 months. During this test, ultrasound is used to evaluate arterial blood flow in the legs. Allow one hour for this exam. There are no restrictions or special instructions. This will take place at [place], [address].   Follow-Up: At [facility], you and your health needs are our priority.  As part of our continuing mission to provide you with exceptional heart care, we have created designated Provider Care Teams.  These Care Teams include your primary Cardiologist (physician) and Advanced Practice Providers (APPs -  Physician Assistants and Nurse Practitioners) who all work together to provide you with the care you need, when you need it.  We recommend signing up for the patient portal called "MyChart".  Sign up information is provided on this After Visit Summary.  MyChart is used to connect with patients for Virtual Visits (Telemedicine).  Patients are able to view lab/test results, encounter notes, upcoming  appointments, etc.  Non-urgent messages can be sent to your provider as well.   To learn more about what you can do with MyChart, go to [email or web address].    Your next appointment:   6 month(s)  The format for your next appointment:   In Person  Provider:   Kathlyn Sacramento, MD

## 2019-11-09 NOTE — Addendum Note (Signed)
Addended by: Orma Render on: 11/09/2019 08:47 AM   Modules accepted: Orders

## 2019-11-10 DIAGNOSIS — E1165 Type 2 diabetes mellitus with hyperglycemia: Secondary | ICD-10-CM | POA: Diagnosis not present

## 2019-11-10 DIAGNOSIS — E039 Hypothyroidism, unspecified: Secondary | ICD-10-CM | POA: Diagnosis not present

## 2019-11-16 DIAGNOSIS — N183 Chronic kidney disease, stage 3 unspecified: Secondary | ICD-10-CM | POA: Diagnosis not present

## 2019-11-16 DIAGNOSIS — R809 Proteinuria, unspecified: Secondary | ICD-10-CM | POA: Diagnosis not present

## 2019-11-16 DIAGNOSIS — I129 Hypertensive chronic kidney disease with stage 1 through stage 4 chronic kidney disease, or unspecified chronic kidney disease: Secondary | ICD-10-CM | POA: Diagnosis not present

## 2019-11-16 DIAGNOSIS — N2581 Secondary hyperparathyroidism of renal origin: Secondary | ICD-10-CM | POA: Diagnosis not present

## 2019-11-17 DIAGNOSIS — E039 Hypothyroidism, unspecified: Secondary | ICD-10-CM | POA: Diagnosis not present

## 2019-11-17 DIAGNOSIS — R809 Proteinuria, unspecified: Secondary | ICD-10-CM | POA: Diagnosis not present

## 2019-11-17 DIAGNOSIS — E1165 Type 2 diabetes mellitus with hyperglycemia: Secondary | ICD-10-CM | POA: Diagnosis not present

## 2019-11-17 DIAGNOSIS — I2510 Atherosclerotic heart disease of native coronary artery without angina pectoris: Secondary | ICD-10-CM | POA: Diagnosis not present

## 2019-11-17 DIAGNOSIS — Z794 Long term (current) use of insulin: Secondary | ICD-10-CM | POA: Diagnosis not present

## 2019-11-23 DIAGNOSIS — E1140 Type 2 diabetes mellitus with diabetic neuropathy, unspecified: Secondary | ICD-10-CM | POA: Diagnosis not present

## 2019-11-23 DIAGNOSIS — M109 Gout, unspecified: Secondary | ICD-10-CM | POA: Diagnosis not present

## 2019-11-23 DIAGNOSIS — E785 Hyperlipidemia, unspecified: Secondary | ICD-10-CM | POA: Diagnosis not present

## 2019-11-23 DIAGNOSIS — E039 Hypothyroidism, unspecified: Secondary | ICD-10-CM | POA: Diagnosis not present

## 2019-11-23 DIAGNOSIS — I10 Essential (primary) hypertension: Secondary | ICD-10-CM | POA: Diagnosis not present

## 2019-11-30 DIAGNOSIS — M109 Gout, unspecified: Secondary | ICD-10-CM | POA: Diagnosis not present

## 2019-11-30 DIAGNOSIS — I10 Essential (primary) hypertension: Secondary | ICD-10-CM | POA: Diagnosis not present

## 2019-11-30 DIAGNOSIS — E039 Hypothyroidism, unspecified: Secondary | ICD-10-CM | POA: Diagnosis not present

## 2019-11-30 DIAGNOSIS — R809 Proteinuria, unspecified: Secondary | ICD-10-CM | POA: Diagnosis not present

## 2019-12-03 DIAGNOSIS — L820 Inflamed seborrheic keratosis: Secondary | ICD-10-CM | POA: Diagnosis not present

## 2019-12-07 ENCOUNTER — Other Ambulatory Visit: Payer: Self-pay | Admitting: *Deleted

## 2019-12-07 DIAGNOSIS — I6523 Occlusion and stenosis of bilateral carotid arteries: Secondary | ICD-10-CM

## 2019-12-09 DIAGNOSIS — M792 Neuralgia and neuritis, unspecified: Secondary | ICD-10-CM | POA: Diagnosis not present

## 2019-12-15 DIAGNOSIS — R809 Proteinuria, unspecified: Secondary | ICD-10-CM | POA: Diagnosis not present

## 2019-12-15 DIAGNOSIS — E039 Hypothyroidism, unspecified: Secondary | ICD-10-CM | POA: Diagnosis not present

## 2019-12-15 DIAGNOSIS — Z794 Long term (current) use of insulin: Secondary | ICD-10-CM | POA: Diagnosis not present

## 2019-12-15 DIAGNOSIS — E1165 Type 2 diabetes mellitus with hyperglycemia: Secondary | ICD-10-CM | POA: Diagnosis not present

## 2019-12-15 DIAGNOSIS — I2510 Atherosclerotic heart disease of native coronary artery without angina pectoris: Secondary | ICD-10-CM | POA: Diagnosis not present

## 2019-12-16 ENCOUNTER — Other Ambulatory Visit: Payer: Self-pay

## 2019-12-16 ENCOUNTER — Ambulatory Visit: Payer: Medicare Other | Admitting: Podiatry

## 2019-12-16 DIAGNOSIS — M79675 Pain in left toe(s): Secondary | ICD-10-CM | POA: Diagnosis not present

## 2019-12-16 DIAGNOSIS — M79674 Pain in right toe(s): Secondary | ICD-10-CM | POA: Diagnosis not present

## 2019-12-16 DIAGNOSIS — M21612 Bunion of left foot: Secondary | ICD-10-CM

## 2019-12-16 DIAGNOSIS — E1142 Type 2 diabetes mellitus with diabetic polyneuropathy: Secondary | ICD-10-CM | POA: Diagnosis not present

## 2019-12-16 DIAGNOSIS — M21621 Bunionette of right foot: Secondary | ICD-10-CM

## 2019-12-16 DIAGNOSIS — B351 Tinea unguium: Secondary | ICD-10-CM | POA: Diagnosis not present

## 2019-12-16 DIAGNOSIS — I739 Peripheral vascular disease, unspecified: Secondary | ICD-10-CM | POA: Diagnosis not present

## 2019-12-16 DIAGNOSIS — M2012 Hallux valgus (acquired), left foot: Secondary | ICD-10-CM

## 2019-12-16 DIAGNOSIS — M21622 Bunionette of left foot: Secondary | ICD-10-CM

## 2019-12-16 DIAGNOSIS — I70213 Atherosclerosis of native arteries of extremities with intermittent claudication, bilateral legs: Secondary | ICD-10-CM

## 2019-12-19 NOTE — Progress Notes (Signed)
  Subjective:  Patient ID: Harold [name], male    DOB: 10-02-1955,  MRN: [id number]  Chief Complaint  Patient presents with  . routine foot care    nail trim     64 y.o. male returns with the above complaint. History confirmed with patient.  He recently saw his cardiologist Dr. Fletcher Anon and underwent angioplasty of the right lower extremity.  Angiography of the left lower extremity showed good runoff to the foot.  The bunion and tailor's bunion is still painful for him, he eventually would like to have it corrected, however he is still trying to lose weight and lower his A1c(It is currently above 8%).  Objective:  Physical Exam: no trophic changes or ulcerative lesions, reduced sensation at plantar pulps of toes and feet, large spider veins, varicosities and torturous veins in the lower legs along with venous stasis dermatitis noted and +1 PT and DP pulses bilaterally, both feet are warm and well-perfused Left Foot: Moderate to severe hallux valgus with semirigid hammertoes present, mycotic nails x5 Right Foot: Mild to moderate hallux valgus with semirigid hammertoes present, mycotic nails since 5    Radiographs: X-ray of both feet: On the left foot there is severe hallux valgus with met primus varus and metatarsus adductus along with first ray elevatus on the left foot there is moderate hallux valgus with degenerative changes in the first metatarsophalangeal joint, previous bunionectomy with removal of the medial eminence, unclear if there was an osteotomy or prior fixation that has since been removed Assessment:   1. Hallux valgus with bunions, left   2. Tailor's bunion of both feet   3. Diabetic polyneuropathy associated with type 2 diabetes mellitus ([place])   4. PAD (peripheral artery disease) ([place])   5. Atherosclerosis of native artery of both lower extremities with intermittent claudication ([place])   6. Onychomycosis   7. Pain due to onychomycosis of toenails of both feet       Plan:  Patient was evaluated and treated and all questions answered.  We again discussed surgical correction of the hallux valgus and tailor's bunion on his left foot.  I do not think a large incision open surgery would be in his best interest, however with his improved blood flow he could be a candidate for small incision percutaneous bunionectomy and tailor's bunionectomy.  We are not able to do this until the continues his weight loss and lower his A1c, he will continue working on this and we will discuss further in the future and he is satisfied with this plan.  Patient educated on diabetes. Discussed proper diabetic foot care and discussed risks and complications of disease. Educated patient in depth on reasons to return to the office immediately should he/she discover anything concerning or new on the feet. All questions answered. Discussed proper shoes as well.    Discussed the etiology and treatment options for the condition in detail with the patient. Educated patient on the topical and oral treatment options for mycotic nails. Recommended debridement of the nails today. Sharp and mechanical debridement performed of all painful and mycotic nails today. Nails debrided in length and thickness using a nail nipper and a mechanical burr to level of comfort. Discussed treatment options including appropriate shoe gear. Follow up as needed for painful nails.     Return in about 3 months (around 03/17/2020) for [facility].

## 2019-12-21 ENCOUNTER — Ambulatory Visit: Payer: Medicare Other | Admitting: Physician Assistant

## 2019-12-21 ENCOUNTER — Ambulatory Visit (HOSPITAL_COMMUNITY)
Admission: RE | Admit: 2019-12-21 | Discharge: 2019-12-21 | Disposition: A | Discharge status: Home / Self Care | Payer: Medicare Other | Source: Ambulatory Visit | Attending: Physician Assistant | Admitting: Physician Assistant

## 2019-12-21 ENCOUNTER — Other Ambulatory Visit: Payer: Self-pay

## 2019-12-21 VITALS — BP 137/79 | HR 67 | Temp 98.4°F | Resp 20 | Ht 73.0 in | Wt 325.7 lb

## 2019-12-21 DIAGNOSIS — I6523 Occlusion and stenosis of bilateral carotid arteries: Secondary | ICD-10-CM | POA: Diagnosis not present

## 2019-12-21 NOTE — Progress Notes (Signed)
History of Present Illness:  Patient is a 64 y.o. year old male who presents for evaluation of carotid stenosis.  The patient underwent right carotid endarterectomy in 2005.    The patient denies symptoms of TIA, amaurosis, or stroke.  The patient is currently on Asa and Plavix antiplatelet therapy.    The pt is on a statin for cholesterol management.              Other AC: Xarelto (history of coronary stents)  Past Medical History:  Diagnosis Date  . Anginal pain [facility]) March 2015   Cardiac cath showed patent stents with distal LAD, circumflex-OM and RCA disease in small vessels.  . Anxiety   . CAD S/P percutaneous coronary angioplasty 2003, 04/2012   status post PCI to LAD, circumflex-OM 2, RCA  . Carotid artery occlusion   . Chronic renal insufficiency, stage II (mild)   . Chronic venous insufficiency    varicosities, no reflux; dopplers 04/14/12- valvular insufficiency in the R and L GSV  . Complication of anesthesia   . COPD, mild ([place])   . Depression    situaltional   . Diabetes mellitus   . Diabetic neuropathy ([place]) 08/24/2019  . Dyslipidemia associated with type 2 diabetes mellitus ([place])   . GERD (gastroesophageal reflux disease)   . HTN (hypertension)   . Hypothyroidism   . Neuromuscular disorder (HCC)    neuropathy in feet  . Neuropathy    notably improved following PCI with improved cardiac function  . Obesity   . 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 PONV (postoperative nausea and vomiting)    "[employer] [place]"  . Pulmonary hypertension ([place]) 04/2012   PA pressure 39mhg  . Sleep apnea    uses nightly    Past Surgical History:  Procedure Laterality Date  . ABDOMINAL AORTAGRAM N/A 11/15/2011   Procedure: ABDOMINAL AMaxcine Ham  Surgeon: CElam Dutch MD;  [facility];  Service: Cardiovascular;  [name];  . ABDOMINAL AORTOGRAM W/LOWER EXTREMITY N/A  10/13/2019   Procedure: ABDOMINAL AORTOGRAM W/LOWER EXTREMITY;  Surgeon: AWellington Hampshire MD;  [facility];  Service: Cardiovascular;  [name];  . ABIs  04/27/2012   mild bilateral arterial insufficiency  . BACK SURGERY  2005 x1   X2-2010  . BUNIONECTOMY Right 11/11/2013   Procedure: RIGHT FOOT SILVER BUNIONECTOMY;  Surgeon: JWylene Simmer MD;  [facility]  Service: Orthopedics;  Laterality: Right;  . CARDIAC CATHETERIZATION  12/11/2001   significant 3V CAD, normal LV function  . Carotid Doppler  04/27/2012   right internal carotid: Elevated velocities but no evidence of plaque. Left internal carotid 40-59%  . CAROTID ENDARTERECTOMY  2005   Right; recent carotid Dopplers notes elevated velocities.   . colonscopy    . CORONARY ANGIOPLASTY  12/21/2001   PTCA of the distal and mid AV groove circ, unsuccessful PTCA of second OM total occlusion, unsuccessful PTCA of the apical LAD total occlusion  . CORONARY ANGIOPLASTY WITH STENT PLACEMENT  04/29/12   PCI to 3 RCA lesions, Promus Premiere 2.256m8mm distally, mid was 2.80m9mmm and proximally 2.75x16m81mF 55-60%  . CORONARY STENT PLACEMENT  04/28/12   PCI to LAD (3x23mm96mnce DES postdilated to 3.25) and circ prox and mid (2 overlappinmg 2.80mmx193mXience DES postdilated to 2.780mm) 26mOPPLER ECHOCARDIOGRAPHY  04/28/2012  poor quality study: EF estimated 60-65%; unable to assess diastolic function (previously noted to have diastolic dysfunction); severely dilated left atrium and mild right atrium; dilated IVC consistent with increased central venous pressure..  . HERNIA REPAIR    . LEFT AND RIGHT HEART CATHETERIZATION WITH CORONARY ANGIOGRAM N/A 04/27/2012   Procedure: LEFT AND RIGHT HEART CATHETERIZATION WITH CORONARY ANGIOGRAM;  Surgeon: Leonie Man, MD;  Location[facility] CATH LAB;  Service: Cardiovascular;  [name];  . LEFT AND RIGHT HEART CATHETERIZATION WITH CORONARY ANGIOGRAM N/A 05/14/2013    Procedure: LEFT AND RIGHT HEART CATHETERIZATION WITH CORONARY ANGIOGRAM;  Surgeon: Troy Sine, MD;  Location: [place] CATH LAB: Moderate Pulm HTN: 46/16 - mean 33 mmHg; PCWP 74mHg;; [name], [name], Patent RCA ostial mid and distal stents - 70% distal runoff Dz  . LUMBAR LAMINECTOMY/DECOMPRESSION MICRODISCECTOMY  01/07/2012   Procedure: LUMBAR LAMINECTOMY/DECOMPRESSION MICRODISCECTOMY 1 LEVEL;  Surgeon: KWinfield Cunas MD;  [name];  Service: Neurosurgery;  Laterality: Bilateral;   Lumbar Three-Four Decompression  . PERCUTANEOUS CORONARY STENT INTERVENTION (PCI-S) N/A 04/28/2012   Procedure: PERCUTANEOUS CORONARY STENT INTERVENTION (PCI-S);  Surgeon: TTroy Sine MD;  [facility];  Service: Cardiovascular;  [name];  . PERCUTANEOUS CORONARY STENT INTERVENTION (PCI-S) N/A 04/29/2012   Procedure: PERCUTANEOUS CORONARY STENT INTERVENTION (PCI-S);  Surgeon: DLeonie Man MD;  [facility];  Service: Cardiovascular;  [name];  . PERIPHERAL VASCULAR BALLOON ANGIOPLASTY Right 10/13/2019   Procedure: PERIPHERAL VASCULAR BALLOON ANGIOPLASTY;  Surgeon: AWellington Hampshire MD;  [facility];  Service: Cardiovascular;  Laterality: Right;  . SPINE SURGERY    . UMBILICAL HERNIA REPAIR  2009   steel mesh insert     Social History Social History   Tobacco Use  . Smoking status: Former Smoker    Packs/day: 1.00    Years: 42.00    Pack years: 42.00    Types: Cigarettes    Quit date: 03/23/2003    Years since quitting: 16.7  . Smokeless tobacco: Never Used  Vaping Use  . Vaping Use: Never used  Substance Use Topics  . Alcohol use: No    Alcohol/week: 0.0 standard drinks  . Drug use: No    Family History Family History  Adopted: Yes  Family history unknown: Yes    Allergies  Allergies  Allergen Reactions  . Septra [Bactrim]  Itching  . Penicillins Hives    Allergy to All cillin drugs  Ancef given 9/24 with no obvious reaction  . Clopidogrel     NON RESPONDER  P2Y12  . Metformin And Related Nausea Only     Current Outpatient Medications  Medication Sig Dispense Refill  . allopurinol (ZYLOPRIM) 100 MG tablet Take 100 mg by mouth 2 (two) times daily.   1  . aspirin EC 81 MG tablet Take 1 tablet (81 mg total) by mouth daily. 90 tablet 3  . B-D ULTRAFINE III SHORT PEN 31G X 8 MM MISC Inject 1 each into the skin 3 (three) times daily.    . carvedilol (COREG) 12.5 MG tablet Take 1 tablet (12.5 mg total) by mouth 2 (two) times daily with a meal. 180 tablet 1  . clopidogrel (PLAVIX) 75 MG tablet TAKE 1 TABLET BY MOUTH EVERY DAY 90 tablet 1  . COLCRYS 0.6 MG tablet Take 0.6 mg by mouth daily.    .Marland Kitchen  Continuous Blood Gluc Receiver (FREESTYLE LIBRE 14 DAY READER) DEVI FreeStyle Libre 14 Day Reader  USE AS DIRECTED    . Continuous Blood Gluc Sensor (FREESTYLE LIBRE 14 DAY SENSOR) MISC APPLY 1 SENSOR EVERY 14 DAYS  11  . DULoxetine (CYMBALTA) 30 MG capsule Take 30 mg by mouth daily.    . DULoxetine (CYMBALTA) 60 MG capsule Take 60 mg by mouth daily.    . empagliflozin (JARDIANCE) 25 MG TABS tablet Take 25 mg by mouth daily.     Marland Kitchen escitalopram (LEXAPRO) 20 MG tablet TAKE 1 TABLET (20 MG TOTAL) BY MOUTH DAILY. (Patient taking differently: Take 20 mg by mouth daily. ) 30 tablet 1  . furosemide (LASIX) 80 MG tablet Take 240 mg by mouth 2 (two) times daily.     Marland Kitchen gabapentin (NEURONTIN) 800 MG tablet Take by mouth.    . insulin regular human CONCENTRATED (HUMULIN R U-500 KWIKPEN) 500 UNIT/ML kwikpen Inject 110 Units into the skin 2 (two) times daily with a meal.     . levothyroxine (SYNTHROID, LEVOTHROID) 88 MCG tablet Take 88 mcg by mouth daily before breakfast.    . losartan (COZAAR) 25 MG tablet Take 1 tablet (25 mg total) by mouth daily. 90 tablet 2  . methylPREDNISolone (MEDROL) 4 MG tablet Take by mouth.    . nitroGLYCERIN  (NITROSTAT) 0.4 MG SL tablet PLACE 1 TAB UNDER THE TONGUE EVERY 5 MINUTES AS NEEDED FOR CHEST PAIN (SEVERE PRESSURE OR TIGHTNESS) (Patient taking differently: Place 0.4 mg under the tongue every 5 (five) minutes as needed for chest pain. ) 25 tablet 3  . omeprazole (PRILOSEC) 20 MG capsule TAKE 1 CAPSULE BY MOUTH EVERY DAY (Patient taking differently: Take 20 mg by mouth daily. ) 90 capsule 3  . OZEMPIC, 1 MG/DOSE, 4 MG/3ML SOPN     . potassium chloride (MICRO-K) 10 MEQ CR capsule Take 10 mEq by mouth daily.   5  . rosuvastatin (CRESTOR) 10 MG tablet Take 10 mg by mouth daily.     No current facility-administered medications for this visit.    ROS:   General:  No weight loss, Fever, chills  HEENT: No recent headaches, no nasal bleeding, no visual changes, no sore throat  Neurologic: No dizziness, blackouts, seizures. No recent symptoms of stroke or mini- stroke. No recent episodes of slurred speech, or temporary blindness.  Cardiac: No recent episodes of chest pain/pressure, no shortness of breath at rest.  No shortness of breath with exertion.  Denies history of atrial fibrillation or irregular heartbeat  Vascular: No history of rest pain in feet.  No history of claudication.  No history of non-healing ulcer, No history of DVT   Pulmonary: No home oxygen, no productive cough, no hemoptysis,  No asthma or wheezing  Musculoskeletal:  _0  Arthritis, _1  Low back pain,  _2  Joint pain  Hematologic:No history of hypercoagulable state.  No history of easy bleeding.  No history of anemia  Gastrointestinal: No hematochezia or melena,  No gastroesophageal reflux, no trouble swallowing  Urinary: _3  chronic Kidney disease, _4  on HD - _5  MWF or _6  TTHS, _7  Burning with urination, _8  Frequent urination, _9  Difficulty urinating;   Skin: No rashes  Psychological: No history of anxiety,  No history of depression   Physical Examination  Vitals:   12/21/19 1529 12/21/19 1531  BP: (!)  144/79 137/79  Pulse: 67   Resp: 20   Temp: 98.4 F (36.9  C)   TempSrc: Temporal   SpO2: 96%   Weight: (!) 325 lb 11.2 oz (147.7 kg)   Height: _0  (1.854 m)     Body mass index is 42.97 kg/m.  General:  Alert and oriented, no acute distress HEENT: Normal Neck: No bruit or JVD Pulmonary: Clear to auscultation bilaterally Cardiac: Regular Rate and Rhythm without murmur Gastrointestinal: Soft, non-tender, non-distended, no mass, no scars Skin: No rash Musculoskeletal: No deformity or edema  Neurologic: Upper and lower extremity motor 5/5 and symmetric  DATA:     Right Carotid Findings:  +----------+--------+--------+--------+-----------------------+--------+       PSV cm/sEDV cm/sStenosisPlaque Description   Comments  +----------+--------+--------+--------+-----------------------+--------+  CCA Prox 60   10                         +----------+--------+--------+--------+-----------------------+--------+  CCA Mid  67   13       smooth               +----------+--------+--------+--------+-----------------------+--------+  CCA Distal65   10   <50%  smooth and heterogenous      +----------+--------+--------+--------+-----------------------+--------+  ICA Prox 79   19   1-39%  smooth and heterogenous      +----------+--------+--------+--------+-----------------------+--------+  ICA Mid  85   25                         +----------+--------+--------+--------+-----------------------+--------+  ICA Distal98   29                         +----------+--------+--------+--------+-----------------------+--------+  ECA    144   0    >50%                    +----------+--------+--------+--------+-----------------------+--------+    +----------+--------+-------+----------------+-------------------+       PSV cm/sEDV cmsDescribe    Arm Pressure (mmHG)  +----------+--------+-------+----------------+-------------------+  [id number]   0   Multiphasic, WNL            +----------+--------+-------+----------------+-------------------+   +---------+--------+--+--------+--+---------+  VertebralPSV cm/s25EDV cm/s10Antegrade  +---------+--------+--+--------+--+---------+      Left Carotid Findings:  +----------+--------+--------+--------+--------------------------+--------+        PSV cm/sEDV cm/sStenosisPlaque Description      Comments  +----------+--------+--------+--------+--------------------------+--------+   CCA Prox 61   18       irregular and heterogenous        +----------+--------+--------+--------+--------------------------+--------+   CCA Mid  58   16       irregular                 +----------+--------+--------+--------+--------------------------+--------+   CCA Distal274   56   >50%  calcific and irregular          +----------+--------+--------+--------+--------------------------+--------+   ICA Prox 254   59   40-59% calcific and irregular          +----------+--------+--------+--------+--------------------------+--------+   ICA Mid  148   26                            +----------+--------+--------+--------+--------------------------+--------+   ICA Distal80   22                            +----------+--------+--------+--------+--------------------------+--------+   ECA    452   47   >50%  calcific and irregular          +----------+--------+--------+--------+--------------------------+--------+     +----------+--------+--------+----------------+-------------------+  PSV cm/sEDV cm/sDescribe    Arm Pressure (mmHG)  +----------+--------+--------+----------------+-------------------+  Subclavian202   0    Multiphasic, WNL            +----------+--------+--------+----------------+-------------------+   +---------+--------+--+--------+--+---------+  VertebralPSV cm/s38EDV cm/s10Antegrade  +---------+--------+--+--------+--+---------+        Summary:  Right Carotid: Velocities in the right ICA are consistent with a 1-39%  stenosis.         Non-hemodynamically significant plaque <50% noted in the  CCA. The         ECA appears >50% stenosed.   Left Carotid: Velocities in the left ICA are consistent with a 40-59%  stenosis.        Hemodynamically significant plaque >50% visualized in the  CCA. The        ECA appears >50% stenosed.   Vertebrals: Bilateral vertebral arteries demonstrate antegrade flow.  Subclavians: Normal flow hemodynamics were seen in bilateral subclavian        arteries.   ASSESSMENT:   S/P right CEA 2005 He is asymptomatic for symptoms of stroke or TIA.  He tries to stay active,but he has had multiple spine interventions and this decrease his ability to walk very far.  He symptoms of sciatica.  His duplex is essentially  unchanged with the right ICA < 39% stenosis and the left 40-59% stenosis.   PLAN: He will return in 9 months with a repeat carotid duplex.  If he has any symptoms of stroke or TIA he will call 911.     Roxy Horseman PA-C [facility] Office: [phone]  MD in clinic [place]

## 2019-12-27 ENCOUNTER — Ambulatory Visit: Payer: Medicare Other | Admitting: Neurology

## 2019-12-28 DIAGNOSIS — D1779 Benign lipomatous neoplasm of other sites: Secondary | ICD-10-CM | POA: Diagnosis not present

## 2019-12-28 DIAGNOSIS — M5440 Lumbago with sciatica, unspecified side: Secondary | ICD-10-CM | POA: Diagnosis not present

## 2020-01-06 DIAGNOSIS — E1140 Type 2 diabetes mellitus with diabetic neuropathy, unspecified: Secondary | ICD-10-CM | POA: Diagnosis not present

## 2020-01-06 DIAGNOSIS — Z794 Long term (current) use of insulin: Secondary | ICD-10-CM | POA: Diagnosis not present

## 2020-01-06 DIAGNOSIS — M792 Neuralgia and neuritis, unspecified: Secondary | ICD-10-CM | POA: Diagnosis not present

## 2020-01-18 DIAGNOSIS — M109 Gout, unspecified: Secondary | ICD-10-CM | POA: Diagnosis not present

## 2020-01-18 DIAGNOSIS — M25561 Pain in right knee: Secondary | ICD-10-CM | POA: Diagnosis not present

## 2020-01-18 DIAGNOSIS — M1990 Unspecified osteoarthritis, unspecified site: Secondary | ICD-10-CM | POA: Diagnosis not present

## 2020-01-18 DIAGNOSIS — M25559 Pain in unspecified hip: Secondary | ICD-10-CM | POA: Diagnosis not present

## 2020-01-18 DIAGNOSIS — Z79899 Other long term (current) drug therapy: Secondary | ICD-10-CM | POA: Diagnosis not present

## 2020-01-19 ENCOUNTER — Other Ambulatory Visit: Payer: Self-pay | Admitting: Neurosurgery

## 2020-01-19 DIAGNOSIS — D1779 Benign lipomatous neoplasm of other sites: Secondary | ICD-10-CM

## 2020-01-21 DIAGNOSIS — G4733 Obstructive sleep apnea (adult) (pediatric): Secondary | ICD-10-CM | POA: Diagnosis not present

## 2020-01-25 ENCOUNTER — Institutional Professional Consult (permissible substitution): Payer: Medicare Other | Admitting: Neurology

## 2020-01-26 DIAGNOSIS — Z794 Long term (current) use of insulin: Secondary | ICD-10-CM | POA: Diagnosis not present

## 2020-01-26 DIAGNOSIS — I2510 Atherosclerotic heart disease of native coronary artery without angina pectoris: Secondary | ICD-10-CM | POA: Diagnosis not present

## 2020-01-26 DIAGNOSIS — E039 Hypothyroidism, unspecified: Secondary | ICD-10-CM | POA: Diagnosis not present

## 2020-01-26 DIAGNOSIS — E1165 Type 2 diabetes mellitus with hyperglycemia: Secondary | ICD-10-CM | POA: Diagnosis not present

## 2020-01-26 DIAGNOSIS — R809 Proteinuria, unspecified: Secondary | ICD-10-CM | POA: Diagnosis not present

## 2020-02-03 DIAGNOSIS — M792 Neuralgia and neuritis, unspecified: Secondary | ICD-10-CM | POA: Diagnosis not present

## 2020-02-08 ENCOUNTER — Ambulatory Visit
Admission: RE | Admit: 2020-02-08 | Discharge: 2020-02-08 | Disposition: A | Discharge status: Home / Self Care | Payer: Medicare Other | Source: Ambulatory Visit | Attending: Neurosurgery | Admitting: Neurosurgery

## 2020-02-08 ENCOUNTER — Other Ambulatory Visit: Payer: Self-pay

## 2020-02-08 DIAGNOSIS — M47816 Spondylosis without myelopathy or radiculopathy, lumbar region: Secondary | ICD-10-CM | POA: Diagnosis not present

## 2020-02-08 DIAGNOSIS — M5126 Other intervertebral disc displacement, lumbar region: Secondary | ICD-10-CM | POA: Diagnosis not present

## 2020-02-08 DIAGNOSIS — M48061 Spinal stenosis, lumbar region without neurogenic claudication: Secondary | ICD-10-CM | POA: Diagnosis not present

## 2020-02-08 DIAGNOSIS — D1779 Benign lipomatous neoplasm of other sites: Secondary | ICD-10-CM

## 2020-02-08 DIAGNOSIS — M2578 Osteophyte, vertebrae: Secondary | ICD-10-CM | POA: Diagnosis not present

## 2020-02-08 MED ORDER — GADOBENATE DIMEGLUMINE 529 MG/ML IV SOLN
20.0000 mL | Freq: Once | INTRAVENOUS | Status: AC | PRN
  Administered 2020-02-08: 15:00:00 20 mL via INTRAVENOUS

## 2020-02-10 DIAGNOSIS — M48062 Spinal stenosis, lumbar region with neurogenic claudication: Secondary | ICD-10-CM | POA: Diagnosis not present

## 2020-02-10 DIAGNOSIS — D1779 Benign lipomatous neoplasm of other sites: Secondary | ICD-10-CM | POA: Diagnosis not present

## 2020-02-28 DIAGNOSIS — E1140 Type 2 diabetes mellitus with diabetic neuropathy, unspecified: Secondary | ICD-10-CM | POA: Diagnosis not present

## 2020-02-28 DIAGNOSIS — I10 Essential (primary) hypertension: Secondary | ICD-10-CM | POA: Diagnosis not present

## 2020-03-07 DIAGNOSIS — Z794 Long term (current) use of insulin: Secondary | ICD-10-CM | POA: Diagnosis not present

## 2020-03-07 DIAGNOSIS — I2510 Atherosclerotic heart disease of native coronary artery without angina pectoris: Secondary | ICD-10-CM | POA: Diagnosis not present

## 2020-03-07 DIAGNOSIS — R809 Proteinuria, unspecified: Secondary | ICD-10-CM | POA: Diagnosis not present

## 2020-03-07 DIAGNOSIS — G4733 Obstructive sleep apnea (adult) (pediatric): Secondary | ICD-10-CM | POA: Diagnosis not present

## 2020-03-07 DIAGNOSIS — E039 Hypothyroidism, unspecified: Secondary | ICD-10-CM | POA: Diagnosis not present

## 2020-03-07 DIAGNOSIS — E1165 Type 2 diabetes mellitus with hyperglycemia: Secondary | ICD-10-CM | POA: Diagnosis not present

## 2020-03-13 ENCOUNTER — Other Ambulatory Visit: Payer: Self-pay

## 2020-03-13 ENCOUNTER — Telehealth: Payer: Self-pay | Admitting: Podiatry

## 2020-03-13 ENCOUNTER — Ambulatory Visit: Payer: Medicare Other | Admitting: Podiatry

## 2020-03-13 DIAGNOSIS — M21621 Bunionette of right foot: Secondary | ICD-10-CM | POA: Diagnosis not present

## 2020-03-13 DIAGNOSIS — M2012 Hallux valgus (acquired), left foot: Secondary | ICD-10-CM | POA: Diagnosis not present

## 2020-03-13 DIAGNOSIS — E1142 Type 2 diabetes mellitus with diabetic polyneuropathy: Secondary | ICD-10-CM

## 2020-03-13 DIAGNOSIS — B353 Tinea pedis: Secondary | ICD-10-CM | POA: Diagnosis not present

## 2020-03-13 DIAGNOSIS — M79675 Pain in left toe(s): Secondary | ICD-10-CM

## 2020-03-13 DIAGNOSIS — L97512 Non-pressure chronic ulcer of other part of right foot with fat layer exposed: Secondary | ICD-10-CM

## 2020-03-13 DIAGNOSIS — M21612 Bunion of left foot: Secondary | ICD-10-CM | POA: Diagnosis not present

## 2020-03-13 DIAGNOSIS — M79674 Pain in right toe(s): Secondary | ICD-10-CM

## 2020-03-13 DIAGNOSIS — I739 Peripheral vascular disease, unspecified: Secondary | ICD-10-CM | POA: Diagnosis not present

## 2020-03-13 DIAGNOSIS — B351 Tinea unguium: Secondary | ICD-10-CM

## 2020-03-13 DIAGNOSIS — L84 Corns and callosities: Secondary | ICD-10-CM | POA: Diagnosis not present

## 2020-03-13 DIAGNOSIS — M21622 Bunionette of left foot: Secondary | ICD-10-CM

## 2020-03-13 MED ORDER — CLINDAMYCIN HCL 150 MG PO CAPS: 150.0000 mg | ORAL_CAPSULE | Freq: Four times a day (QID) | ORAL | 0 refills | Status: DC

## 2020-03-13 MED ORDER — KETOCONAZOLE 2 % EX CREA: 1.0000 "application " | TOPICAL_CREAM | Freq: Every day | CUTANEOUS | 2 refills | Status: DC

## 2020-03-13 MED ORDER — MUPIROCIN 2 % EX OINT: 1.0000 "application " | TOPICAL_OINTMENT | Freq: Two times a day (BID) | CUTANEOUS | 2 refills | Status: DC

## 2020-03-13 NOTE — Telephone Encounter (Signed)
Patient called stating 2 of 3 prescription has been sent to pharmacy, clindamycin (CLEOCIN) has not been sent. The [employer] system was down earlier which could be why, Please Advise

## 2020-03-14 ENCOUNTER — Encounter: Payer: Self-pay | Admitting: Podiatry

## 2020-03-14 MED ORDER — CLINDAMYCIN HCL 150 MG PO CAPS: 150.0000 mg | ORAL_CAPSULE | Freq: Four times a day (QID) | ORAL | 0 refills | Status: AC

## 2020-03-14 NOTE — Progress Notes (Signed)
Subjective:  Patient ID: Harold [name], male    DOB: 1955-07-09,  MRN: [id number]  Chief Complaint  Patient presents with  . routine foot care    Nail trim     65 y.o. male returns with the above complaint. History confirmed with patient.  The bunion on the left foot is still painful.  Details regarding the right foot is still bothersome as well as well as recurrence of the bunion on the right foot after having surgery for it.  He has had small blisters formed over the bunion on the left foot and the heel wound on the right foot over the fifth toe  Objective:  Physical Exam: no trophic changes or ulcerative lesions, reduced sensation at plantar pulps of toes and feet, large spider veins, varicosities and torturous veins in the lower legs along with venous stasis dermatitis noted and +1 PT and DP pulses bilaterally, both feet are warm and well-perfused Left Foot: Moderate to severe hallux valgus with semirigid hammertoes present, mycotic nails x5, overlying the dorsal medial bony eminence of the bunion there is a small superficial scab with no ulceration underneath.  Callus on medial hallux Right Foot: Mild to moderate hallux valgus with semirigid hammertoes present, mycotic nails since 5.  There is a callus on the medial hallux. right dorsal fifth PIPJ there is a tissue scab with underlying ulceration with some serous drainage underneath, subcutaneous tissue was exposed and a small area underneath.  Measures 0.5 cm x 0.5 cm x 0.2 cm.  Interdigital maceration with scaling tinea pedis rash between fourth and fifth toes   Radiographs: X-ray of both feet: On the left foot there is severe hallux valgus with met primus varus and metatarsus adductus along with first ray elevatus on the left foot there is moderate hallux valgus with degenerative changes in the first metatarsophalangeal joint, previous bunionectomy with removal of the medial eminence, unclear if there was an osteotomy or prior  fixation that has since been removed Assessment:   1. Hallux valgus with bunions, left   2. Tailor's bunion of both feet   3. Diabetic polyneuropathy associated with type 2 diabetes mellitus ([place])   4. PAD (peripheral artery disease) (HCC)   5. Pain due to onychomycosis of toenails of both feet   6. Skin ulcer of toe of right foot with fat layer exposed ([place])      Plan:  Patient was evaluated and treated and all questions answered.  We again discussed surgical correction of the hallux valgus and tailor's bunion on his left foot.  I do not think a large incision open surgery would be in his best interest, however with his improved blood flow he could be a candidate for small incision percutaneous bunionectomy and tailor's bunionectomy.  He also has upcoming spine issues that he may be having surgery for.  I recommend he have his spine sorted out and then we will consider surgical intervention in late spring or summer  Patient educated on diabetes. Discussed proper diabetic foot care and discussed risks and complications of disease. Educated patient in depth on reasons to return to the office immediately should he/she discover anything concerning or new on the feet. All questions answered. Discussed proper shoes as well.   Discussed the etiology and treatment options for the condition in detail with the patient. Educated patient on the topical and oral treatment options for mycotic nails. Recommended debridement of the nails today. Sharp and mechanical debridement performed of all painful and mycotic nails  today. Nails debrided in length and thickness using a nail nipper and a mechanical burr to level of comfort. Discussed treatment options including appropriate shoe gear. Follow up as needed for painful nails.  Discussed the etiology and treatment options for tinea pedis.  Discussed topical and oral treatment.  Recommended topical treatment with 2% ketoconazole cream.  This was sent to the  patient's pharmacy.  Also discussed appropriate foot hygiene, use of antifungal spray such as Tinactin in shoes, as well as cleaning foot surfaces such as showers and bathroom floors with bleach.   Ulcer right fifth toe -Debridement as below. -Dressed with Iodosorb, DSD. -7 days clindamycin oral antibiotic as a precaution.  (He has penicillin and Bactrim allergies)   Procedure: Excisional Debridement of Wound Rationale: Removal of non-viable soft tissue from the wound to promote healing.  Anesthesia: none Pre-Debridement Wound Measurements: Unmeasurable due to overlying scab Post-Debridement Wound Measurements: 0.5 cm x 0.5 cm x 0.2 cm  Type of Debridement: Sharp Excisional Tissue Removed: Non-viable soft tissue Depth of Debridement: subcutaneous tissue. Technique: Sharp excisional debridement to bleeding, viable wound base.  Dressing: Dry, sterile, compression dressing. Disposition: Patient tolerated procedure well.  Return in about 4 weeks (around 04/10/2020) for wound re-check.

## 2020-03-17 ENCOUNTER — Ambulatory Visit: Payer: Medicare Other | Admitting: Podiatry

## 2020-04-11 ENCOUNTER — Other Ambulatory Visit: Payer: Self-pay

## 2020-04-11 ENCOUNTER — Ambulatory Visit (INDEPENDENT_AMBULATORY_CARE_PROVIDER_SITE_OTHER): Payer: Medicare Other | Admitting: Podiatry

## 2020-04-11 DIAGNOSIS — L97512 Non-pressure chronic ulcer of other part of right foot with fat layer exposed: Secondary | ICD-10-CM | POA: Diagnosis not present

## 2020-04-11 DIAGNOSIS — M21612 Bunion of left foot: Secondary | ICD-10-CM

## 2020-04-11 DIAGNOSIS — I739 Peripheral vascular disease, unspecified: Secondary | ICD-10-CM

## 2020-04-11 DIAGNOSIS — M21622 Bunionette of left foot: Secondary | ICD-10-CM | POA: Diagnosis not present

## 2020-04-11 DIAGNOSIS — E1142 Type 2 diabetes mellitus with diabetic polyneuropathy: Secondary | ICD-10-CM

## 2020-04-11 DIAGNOSIS — B353 Tinea pedis: Secondary | ICD-10-CM | POA: Diagnosis not present

## 2020-04-11 DIAGNOSIS — M2012 Hallux valgus (acquired), left foot: Secondary | ICD-10-CM | POA: Diagnosis not present

## 2020-04-11 DIAGNOSIS — M21621 Bunionette of right foot: Secondary | ICD-10-CM | POA: Diagnosis not present

## 2020-04-13 ENCOUNTER — Encounter: Payer: Self-pay | Admitting: Podiatry

## 2020-04-13 NOTE — Progress Notes (Signed)
  Subjective:  Patient ID: Harold [name], male    DOB: 08-11-1955,  MRN: [id number]  Chief Complaint  Patient presents with  . Nail Problem    PT stated that he is doing better and he has no major concerns at this time    65 y.o. male returns with the above complaint. History confirmed with patient. Doing well. He put the ointment on daily until recently.  Objective:  Physical Exam: no trophic changes or ulcerative lesions, reduced sensation at plantar pulps of toes and feet, large spider veins, varicosities and torturous veins in the lower legs along with venous stasis dermatitis noted and +1 PT and DP pulses bilaterally, both feet are warm and well-perfused Left Foot: Moderate to severe hallux valgus with semirigid hammertoes present, mycotic nails x5, overlying the dorsal medial bony eminence of the bunion there is a small superficial scab with no ulceration underneath.  Callus on medial hallux Right Foot: Mild to moderate hallux valgus with semirigid hammertoes present, mycotic nails since 5.  There is a callus on the medial hallux. right dorsal fifth PIPJ ulcer has healed with overlying hyperkeratosis. Debridement of this shows that there is intact skin normally. Interdigital maceration with scaling tinea pedis rash between fourth and fifth toes   Radiographs: X-ray of both feet: On the left foot there is severe hallux valgus with met primus varus and metatarsus adductus along with first ray elevatus on the left foot there is moderate hallux valgus with degenerative changes in the first metatarsophalangeal joint, previous bunionectomy with removal of the medial eminence, unclear if there was an osteotomy or prior fixation that has since been removed Assessment:   1. Hallux valgus with bunions, left   2. Tailor's bunion of both feet   3. Diabetic polyneuropathy associated with type 2 diabetes mellitus ([place])   4. PAD (peripheral artery disease) ([place])   5. Skin ulcer of toe of right  foot with fat layer exposed ([place])      Plan:  Patient was evaluated and treated and all questions answered.  Continue ketoconazole light layer between toes and on plantar feet, this is somewhat difficult for him. I also recommended a spray lotion to moisturize his feet.  Ulcer right fifth toe -Doing much better has healed completely now -Return before next visit if recurrence of ulcer or signs of infection develops  Return in about 2 months (around 06/09/2020) for at risk diabetic foot care.

## 2020-04-17 DIAGNOSIS — N183 Chronic kidney disease, stage 3 unspecified: Secondary | ICD-10-CM | POA: Diagnosis not present

## 2020-04-18 DIAGNOSIS — E1165 Type 2 diabetes mellitus with hyperglycemia: Secondary | ICD-10-CM | POA: Diagnosis not present

## 2020-04-18 DIAGNOSIS — G4733 Obstructive sleep apnea (adult) (pediatric): Secondary | ICD-10-CM | POA: Diagnosis not present

## 2020-04-18 DIAGNOSIS — R809 Proteinuria, unspecified: Secondary | ICD-10-CM | POA: Diagnosis not present

## 2020-04-18 DIAGNOSIS — I2510 Atherosclerotic heart disease of native coronary artery without angina pectoris: Secondary | ICD-10-CM | POA: Diagnosis not present

## 2020-04-18 DIAGNOSIS — E039 Hypothyroidism, unspecified: Secondary | ICD-10-CM | POA: Diagnosis not present

## 2020-04-18 DIAGNOSIS — Z794 Long term (current) use of insulin: Secondary | ICD-10-CM | POA: Diagnosis not present

## 2020-04-24 DIAGNOSIS — I129 Hypertensive chronic kidney disease with stage 1 through stage 4 chronic kidney disease, or unspecified chronic kidney disease: Secondary | ICD-10-CM | POA: Diagnosis not present

## 2020-04-24 DIAGNOSIS — R809 Proteinuria, unspecified: Secondary | ICD-10-CM | POA: Diagnosis not present

## 2020-04-24 DIAGNOSIS — N2581 Secondary hyperparathyroidism of renal origin: Secondary | ICD-10-CM | POA: Diagnosis not present

## 2020-04-24 DIAGNOSIS — N183 Chronic kidney disease, stage 3 unspecified: Secondary | ICD-10-CM | POA: Diagnosis not present

## 2020-04-30 ENCOUNTER — Other Ambulatory Visit: Payer: Self-pay | Admitting: Cardiovascular Disease

## 2020-05-01 ENCOUNTER — Other Ambulatory Visit: Payer: Self-pay | Admitting: Cardiovascular Disease

## 2020-05-01 ENCOUNTER — Other Ambulatory Visit: Payer: Self-pay | Admitting: Cardiology

## 2020-05-01 NOTE — Telephone Encounter (Signed)
Refill Request.

## 2020-05-10 ENCOUNTER — Ambulatory Visit: Payer: Medicare Other | Admitting: Neurology

## 2020-05-10 ENCOUNTER — Encounter: Payer: Self-pay | Admitting: Neurology

## 2020-06-13 ENCOUNTER — Other Ambulatory Visit: Payer: Self-pay

## 2020-06-13 ENCOUNTER — Ambulatory Visit: Payer: Medicare Other | Admitting: Podiatry

## 2020-06-13 ENCOUNTER — Encounter: Payer: Self-pay | Admitting: Podiatry

## 2020-06-13 DIAGNOSIS — E1142 Type 2 diabetes mellitus with diabetic polyneuropathy: Secondary | ICD-10-CM | POA: Diagnosis not present

## 2020-06-13 DIAGNOSIS — M79675 Pain in left toe(s): Secondary | ICD-10-CM | POA: Diagnosis not present

## 2020-06-13 DIAGNOSIS — I70213 Atherosclerosis of native arteries of extremities with intermittent claudication, bilateral legs: Secondary | ICD-10-CM | POA: Diagnosis not present

## 2020-06-13 DIAGNOSIS — L84 Corns and callosities: Secondary | ICD-10-CM

## 2020-06-13 DIAGNOSIS — B351 Tinea unguium: Secondary | ICD-10-CM | POA: Diagnosis not present

## 2020-06-13 DIAGNOSIS — M79674 Pain in right toe(s): Secondary | ICD-10-CM | POA: Diagnosis not present

## 2020-06-13 DIAGNOSIS — I739 Peripheral vascular disease, unspecified: Secondary | ICD-10-CM | POA: Diagnosis not present

## 2020-06-13 NOTE — Progress Notes (Signed)
  Subjective:  Patient ID: Harold [name], male    DOB: 1955-10-31,  MRN: [id number]  Chief Complaint  Patient presents with  . Diabetes      [facility] at risk ft care     65 y.o. male returns with the above complaint. History confirmed with patient.  Doing okay he has had some very dry skin and fissuring on the heels  Objective:  Physical Exam: no trophic changes or ulcerative lesions, reduced sensation at plantar pulps of toes and feet, large spider veins, varicosities and torturous veins in the lower legs along with venous stasis dermatitis noted and +1 PT and DP pulses bilaterally, both feet are warm and well-perfused.  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  There is a callus on the medial hallux. right dorsal fifth PIPJ ulcer has healed with overlying hyperkeratosis.   Radiographs: X-ray of both feet: On the left foot there is severe hallux valgus with met primus varus and metatarsus adductus along with first ray elevatus on the left foot there is moderate hallux valgus with degenerative changes in the first metatarsophalangeal joint, previous bunionectomy with removal of the medial eminence, unclear if there was an osteotomy or prior fixation that has since been removed Assessment:   1. Pain due to onychomycosis of toenails of both feet   2. Corns and callus   3. Atherosclerosis of native artery of both lower extremities with intermittent claudication ([place])   4. PAD (peripheral artery disease) ([place])   5. Diabetic polyneuropathy associated with type 2 diabetes mellitus ([place])      Plan:  Patient was evaluated and treated and all questions answered.  Patient educated on diabetes. Discussed proper diabetic foot care and discussed risks and complications of  disease. Educated patient in depth on reasons to return to the office immediately should he/she discover anything concerning or new on the feet. All questions answered. Discussed proper shoes as well.   Discussed the etiology and treatment options for the condition in detail with the patient. Educated patient on the topical and oral treatment options for mycotic nails. Recommended debridement of the nails today. Sharp and mechanical debridement performed of all painful and mycotic nails today. Nails debrided in length and thickness using a nail nipper to level of comfort. Discussed treatment options including appropriate shoe gear. Follow up as needed for painful nails.  All symptomatic hyperkeratoses were safely debrided with a sterile #15 blade to patient's level of comfort without incident. We discussed preventative and palliative care of these lesions including supportive and accommodative shoegear, padding, prefabricated and custom molded accommodative orthoses, use of a pumice stone and lotions/creams daily.   Return in about 9 weeks (around 08/15/2020) for at risk diabetic foot care.

## 2020-06-13 NOTE — Patient Instructions (Signed)
Moisturize feet once daily; do not apply between toes: A.  Aquaphor Healing Ointment B.  Vaseline Intensive Care Lotion C.  Lubriderm Lotion D.  Gold Bond Diabetic Foot Lotion E.  Eucerin Intensive Repair Moisturizing Lotion  If you have problems reaching your feet:  A.  Aquaphor Advanced Therapy Ointment Body Spray B.  Vaseline Intensive Care Spray Lotion Advanced Repair

## 2020-06-22 ENCOUNTER — Ambulatory Visit (INDEPENDENT_AMBULATORY_CARE_PROVIDER_SITE_OTHER): Payer: Medicare Other | Admitting: Cardiology

## 2020-06-22 ENCOUNTER — Other Ambulatory Visit: Payer: Self-pay

## 2020-06-22 VITALS — BP 112/66 | HR 68 | Ht 73.0 in | Wt 330.0 lb

## 2020-06-22 DIAGNOSIS — E1169 Type 2 diabetes mellitus with other specified complication: Secondary | ICD-10-CM

## 2020-06-22 DIAGNOSIS — I2510 Atherosclerotic heart disease of native coronary artery without angina pectoris: Secondary | ICD-10-CM | POA: Diagnosis not present

## 2020-06-22 DIAGNOSIS — I5032 Chronic diastolic (congestive) heart failure: Secondary | ICD-10-CM

## 2020-06-22 DIAGNOSIS — I878 Other specified disorders of veins: Secondary | ICD-10-CM

## 2020-06-22 DIAGNOSIS — I6523 Occlusion and stenosis of bilateral carotid arteries: Secondary | ICD-10-CM

## 2020-06-22 DIAGNOSIS — I272 Pulmonary hypertension, unspecified: Secondary | ICD-10-CM | POA: Diagnosis not present

## 2020-06-22 DIAGNOSIS — I70213 Atherosclerosis of native arteries of extremities with intermittent claudication, bilateral legs: Secondary | ICD-10-CM

## 2020-06-22 DIAGNOSIS — Z9989 Dependence on other enabling machines and devices: Secondary | ICD-10-CM

## 2020-06-22 DIAGNOSIS — Z9861 Coronary angioplasty status: Secondary | ICD-10-CM | POA: Diagnosis not present

## 2020-06-22 DIAGNOSIS — E782 Mixed hyperlipidemia: Secondary | ICD-10-CM

## 2020-06-22 DIAGNOSIS — I10 Essential (primary) hypertension: Secondary | ICD-10-CM

## 2020-06-22 DIAGNOSIS — G4733 Obstructive sleep apnea (adult) (pediatric): Secondary | ICD-10-CM

## 2020-06-22 NOTE — Patient Instructions (Addendum)
Medication Instructions:   no changes   *If you need a refill on your cardiac medications before your next appointment, please call your pharmacy*   Lab Work: Not needed    Testing/Procedures:  Not needed  Follow-Up: At [facility], you and your health needs are our priority.  As part of our continuing mission to provide you with exceptional heart care, we have created designated Provider Care Teams.  These Care Teams include your primary Cardiologist (physician) and Advanced Practice Providers (APPs -  Physician Assistants and Nurse Practitioners) who all work together to provide you with the care you need, when you need it.     Your next appointment:   6 month(s)  The format for your next appointment:   In Person  Provider:   Glenetta Hew, MD

## 2020-06-22 NOTE — Progress Notes (Signed)
Primary Care Provider: Deland Pretty, MD Cardiologist: Glenetta Hew, MD   Vascular Cardiologist: Dr. Sophronia Simas Electrophysiologist: None  Clinic Note: Chief Complaint  Patient presents with  . Follow-up    Annual -> no active cardiac symptoms.  Major issue is recent bout of profound depression.  . Coronary Artery Disease    No angina  . 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  He is on Ozempic and Jardiance for diabetes.  Most recent LDL last fall, was 93.  Due for labs to be checked soon in July.   We can reassess lipids at that time.  I suspect that he may need more aggressive therapy.        Relevant Medications   furosemide (LASIX) 80 MG tablet   losartan (COZAAR) 50 MG tablet   Other Relevant Orders   EKG 12-Lead (Completed)   Carotid artery stenosis without cerebral infarction, bilateral (Chronic)    Stable Dopplers on recent check. Status post right carotid endarterectomy.  On aggressive cardiovascular risk factor modification.      Relevant Medications   furosemide (LASIX) 80 MG tablet   losartan (COZAAR) 50 MG tablet   CAD, LAD/CFX DES 04/28/12- staged RCA DES 04/29/12 after pt declined CABG, cath 05/14/13 stable CAD medical therapy - Primary (Chronic)    Multivessel PCI back in 2014 (essentially three-vessel disease with LAD, OM 2 and RCA PCI).  He has not had any further angina since then.  Was switched back to aspirin and Plavix after his peripheral vascular procedure. For now we will continue Plavix alone and stop aspirin.  If next follow-up I plan to switch him back to low-dose aspirin plus low-dose Xarelto. He is back on rosuvastatin at lower dose.  Lipids not quite at goal.  We will watch for his follow-up labs in July, and anticipate referral to CVRR to  consider potential PCSK9 inhibitor.      Relevant Medications   furosemide (LASIX) 80 MG tablet   losartan (COZAAR) 50 MG tablet   Other Relevant Orders   EKG 12-Lead (Completed)   Obesity, Class III, BMI 40-49.9 (morbid obesity) (HCC) (Chronic)    He has been doing relatively well until his most recent bout of depression.  Now his weight is up significantly. Excellent hopefully with dietary adjustments and exercising more including doing his gardening, he will hopefully lose some of the weight back.  In the past he had but the possibility of gastric bypass, but is not ready to make that jump at this time.      Chronic diastolic heart failure, NYHA class 2 - PCWPf 22 mmHg during catheterization. (Chronic)    Mild diastolic heart failure, I think that is not the main reason for his exertional dyspnea.  Mostly related to deconditioning and obesity.  Needs to work on weight loss. On combination of losartan and carvedilol and stable dose.  Blood pressure well controlled. On high-dose furosemide managed by nephrology.  This is in conjunction with Jardiance.      Relevant Medications   furosemide (LASIX) 80 MG tablet   losartan (COZAAR) 50 MG tablet   Other Relevant Orders   EKG 12-Lead (Completed)   Essential hypertension (Chronic)    Well-controlled blood pressure on current dose of losartan and carvedilol.      Relevant Medications  furosemide (LASIX) 80 MG tablet   losartan (COZAAR) 50 MG tablet   Other Relevant Orders   EKG 12-Lead (Completed)   OSA on CPAP (Chronic)    Had been on CPAP, but not using routinely. May need to follow-up with sleep medicine.      Venous stasis of both lower extremities - with edema (Chronic)    Pretty well stable.  Dermatitis changes of almost all faded.  When he is able to be put to support socks on, but right now his back is bothering him.  I do want him to continue to wear his support stockings.  He is on high-dose Lasix as well.       Relevant Medications   furosemide (LASIX) 80 MG tablet   losartan (COZAAR) 50 MG tablet   Pulmonary hypertension, 52mhg (Chronic)    Related to combination of COPD, obesity, OHS and OSA.  Unsure there is also some diastolic heart failure component.  Continue to treat blood pressure with afterload reduction and standing high-dose diuretic.  I think he would benefit from being back on CPAP.       Relevant Medications   furosemide (LASIX) 80 MG tablet   losartan (COZAAR) 50 MG tablet   Morbid (severe) obesity due to excess calories (HCC) (Chronic)   Atherosclerosis of native artery of both lower extremities with intermittent claudication (HCC) (Chronic)    Referred to Dr. ASophronia Simas  Has now had angioplasty of the right TP trunk and posterior tibial artery.  Follow-up Doppler showed notable improvement.  No further claudication.  He is now more than 6 months out, we can probably switch him back to low-dose aspirin and low-dose Xarelto in the next follow-up visit.      Relevant Medications   escitalopram (LEXAPRO) 10 MG tablet   furosemide (LASIX) 80 MG tablet   losartan (COZAAR) 50 MG tablet   Other Relevant Orders   EKG 12-Lead (Completed)     ===================================  HPI:    Harold ALMSis a morbidly obese 65y.o. male with a PMH notable for Multivessel CAD (s/p MV-PCI over CABG in 2014), CAROTID ARTERY DISEASE (s/p R CEA 27616 407-37%LICA), PAD, DM-2 on insulin with CKD 3A, HTN, HLD, OSA/OHS on CPAP, Chronic Venous Stasis who presents today for annual follow-up, 41062%LICA.  [name] was last seen on June 14, 2019.  He was doing well.  In great spirits.  Blood pressure recordings were really well controlled.  He had "retired" from his job and there will being fired due to interaction with a client that led to complaints.  He was disciplined without even hearing his side of the story.  Is now on Medicare, and his level of stress and anxiety had  notably improved.  No chest pain, PND or orthopnea.  Just a little exertional dyspnea if he overdoes it.  Limited by back pain and abdominal pain.  Was given possible diagnosis of IBS. -> Is on combination of basal insulin along with Jardiance and Trulicity for his diabetes.  Was actually back on rosuvastatin and tolerating well.  Tolerating low-dose Xarelto and aspirin -> combination used for CAD/PAD.  On high dose of Lasix per nephrology..  Referred to Dr. ASophronia Simasfor PAD evaluation -> noting worsening calf claudication.  ABIs 0.72 on the right and 0.9 on the left.  Duplex showed severe right TP trunk and occluded AT.  Left AT was occluded..  Recent Hospitalizations:   10/13/2019: Outpatient PV procedure  Seen for  PV follow-up by Dr. Sophronia Simas on 11/09/2019 -> noted significant improvement in right calf claudication.  Noted persistent peripheral neuropathy symptoms but no claudication.  Plan was to treat the occluded anterior tibial arteries medically.  Plan was also to treat with 6 months on Plavix.  At that time we could potentially consider switching back to low-dose Xarelto.  Reviewed  CV studies:    The following studies were reviewed today: (if available, images/films reviewed: From Epic Chart or Care Everywhere) . Peripheral Vascular Catheterization 10/13/2019: Nonobstructive aortoiliac disease.  Normal right SFA and popliteal artery with significant calcific stenosis of the TP trunk and occluded ATA, significant disease in the proximal PTA (the dominant vessel) -> PTCA of the right TP trunk into the  PTA followed by drug-coated balloon angioplasty of the TP trunk. o Follow-up ABIs 0.9 on the right and 0.99 on the left.   Interval History:   Harold [name] presents here today for his annual cardiology follow-up doing pretty well.  He said that back in April he had a significant phase of extreme depression with anhedonia and weight gain.  He really laid around in bed and did hardly anything.   He said that his swelling and weight increased significantly --> he said he was basically eating junk food and comfort foods which are clearly not good for him.Marland Kitchen  He finally "got himself together, pulled himself up by the boot straps" and started back pain attention to his diet, becoming more active and getting outside doing gardening. He has been doing very well over the last couple months but still has ups and downs.  He is not having any chest pain or pressure, and since his lifestyle recalibrated, no significant edema, PND or orthopnea.  No palpitations.  Claudication is well controlled.  He still has peripheral neuropathy symptoms, but his legs are significantly improved with no notable edema.  CV Review of Symptoms (Summary) no chest pain or dyspnea on exertion positive for - Notably improved edema, exertional dyspnea also improved now that he is losing back some of his weight.  He is just quite deconditioned. negative for - chest pain, irregular heartbeat, orthopnea, palpitations, paroxysmal nocturnal dyspnea, rapid heart rate, shortness of breath or Lightheadedness, dizziness or syncope/near syncope, TIA/amaurosis fugax, minimal claudication  REVIEWED OF SYSTEMS   Review of Systems  Constitutional: Positive for malaise/fatigue (Gradually building up his energy level with reduced anhedonia.). Negative for weight loss (He put on about 15 pounds from last September.  Actually this is may be 10 to 15 pounds lighter than he had been.).  HENT: Negative for congestion and sinus pain.   Respiratory: Positive for shortness of breath (Related to deconditioning). Negative for cough and wheezing.   Cardiovascular: Negative for leg swelling.  Gastrointestinal: Negative for abdominal pain, blood in stool and melena.  Genitourinary: Negative for hematuria.  Musculoskeletal: Positive for back pain and joint pain (Still has significant knee pain.  He may need yet again another surgery either his knees or his  back.). Negative for falls.  Neurological: Negative for dizziness and focal weakness.  Psychiatric/Behavioral: Positive for depression (In February March and April, he was notably depressed, significant anhedonia with weight gain-eating comfort foods etc.  Has now come out of the depression and doing better.). Negative for memory loss. The patient is not nervous/anxious and does not have insomnia.     I have reviewed and (if needed) personally updated the patient's problem list, medications, allergies, past medical and surgical history, social and  family history.   PAST MEDICAL HISTORY   Past Medical History:  Diagnosis Date  . Anginal pain [facility]) March 2015   Cardiac cath showed patent stents with distal LAD, circumflex-OM and RCA disease in small vessels.  . Anxiety   . CAD S/P percutaneous coronary angioplasty 2003, 04/2012   status post PCI to LAD, circumflex-OM 2, RCA  . Carotid artery occlusion   . Chronic renal insufficiency, stage II (mild)   . Chronic venous insufficiency    varicosities, no reflux; dopplers 04/14/12- valvular insufficiency in the R and L GSV  . Complication of anesthesia   . COPD, mild ([place])   . Depression    situaltional   . Diabetes mellitus   . Diabetic neuropathy ([place]) 08/24/2019  . Dyslipidemia associated with type 2 diabetes mellitus ([place])   . GERD (gastroesophageal reflux disease)   . HTN (hypertension)   . Hypothyroidism   . Neuromuscular disorder (HCC)    neuropathy in feet  . Neuropathy    notably improved following PCI with improved cardiac function  . Obesity   . 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 PONV (postoperative nausea and vomiting)    "[place]"  . Pulmonary hypertension ([place]) 04/2012   PA pressure 77mhg  . Sleep apnea    uses nightly    PAST SURGICAL HISTORY   Past Surgical History:  Procedure Laterality Date  . ABDOMINAL AORTAGRAM N/A  11/15/2011   Procedure: ABDOMINAL AMaxcine Ham  Surgeon: CElam Dutch MD;  [facility];  Service: Cardiovascular;  [name];  . ABDOMINAL AORTOGRAM W/LOWER EXTREMITY N/A 10/13/2019   Procedure: ABDOMINAL AORTOGRAM W/LOWER EXTREMITY;  Surgeon: AWellington Hampshire MD;  [facility];  Service: Cardiovascular;  [name];  . ABIs  04/27/2012   mild bilateral arterial insufficiency  . BACK SURGERY  2005 x1   X2-2010  . BUNIONECTOMY Right 11/11/2013   Procedure: RIGHT FOOT SILVER BUNIONECTOMY;  Surgeon: JWylene Simmer MD;  [place] [facility]  Service: Orthopedics;  Laterality: Right;  . CARDIAC CATHETERIZATION  12/11/2001   significant 3V CAD, normal LV function  . Carotid Doppler  04/27/2012   right internal carotid: Elevated velocities but no evidence of plaque. Left internal carotid 40-59%  . CAROTID ENDARTERECTOMY  2005   Right; recent carotid Dopplers notes elevated velocities.   . colonscopy    . CORONARY ANGIOPLASTY  12/21/2001   PTCA of the distal and mid AV groove circ, unsuccessful PTCA of second OM total occlusion, unsuccessful PTCA of the apical LAD total occlusion  . CORONARY ANGIOPLASTY WITH STENT PLACEMENT  04/29/12   PCI to 3 RCA lesions, Promus Premiere 2.264m8mm distally, mid was 2.62m79mmm and proximally 2.75x16m662mF 55-60%  . CORONARY STENT PLACEMENT  04/28/12   PCI to LAD (3x23mm19mnce DES postdilated to 3.25) and circ prox and mid (2 overlappinmg 2.62mmx140mXience DES postdilated to 2.762mm) 762mOPPLER ECHOCARDIOGRAPHY  04/28/2012   poor quality study: EF estimated 60-65%; unable to assess diastolic function (previously noted to have diastolic dysfunction); severely dilated left atrium and mild right atrium; dilated IVC consistent with increased central venous pressure..  . HERNIA REPAIR    . LEFT AND RIGHT HEART CATHETERIZATION WITH CORONARY ANGIOGRAM N/A 04/27/2012   Procedure: LEFT AND RIGHT HEART CATHETERIZATION WITH  CORONARY ANGIOGRAM;  Surgeon: Hila Bolding WLeonie ManLocation[facility];  Service: Cardiovascular;  [name];  . LEFT AND RIGHT HEART CATHETERIZATION WITH CORONARY ANGIOGRAM N/A 05/14/2013   Procedure: LEFT AND RIGHT HEART CATHETERIZATION WITH CORONARY ANGIOGRAM;  Surgeon: Troy Sine, MD;  Location: [place] CATH LAB: Moderate Pulm HTN: 46/16 - mean 33 mmHg; PCWP 103mHg;; [name], [name], Patent RCA ostial mid and distal stents - 70% distal runoff Dz  . LUMBAR LAMINECTOMY/DECOMPRESSION MICRODISCECTOMY  01/07/2012   Procedure: LUMBAR LAMINECTOMY/DECOMPRESSION MICRODISCECTOMY 1 LEVEL;  Surgeon: KWinfield Cunas MD;  [name];  Service: Neurosurgery;  Laterality: Bilateral;   Lumbar Three-Four Decompression  . PERCUTANEOUS CORONARY STENT INTERVENTION (PCI-S) N/A 04/28/2012   Procedure: PERCUTANEOUS CORONARY STENT INTERVENTION (PCI-S);  Surgeon: TTroy Sine MD;  [facility];  Service: Cardiovascular;  [name];  . PERCUTANEOUS CORONARY STENT INTERVENTION (PCI-S) N/A 04/29/2012   Procedure: PERCUTANEOUS CORONARY STENT INTERVENTION (PCI-S);  Surgeon: DLeonie Man MD;  [facility];  Service: Cardiovascular;  [name];  . PERIPHERAL VASCULAR BALLOON ANGIOPLASTY Right 10/13/2019   Procedure: PERIPHERAL VASCULAR BALLOON ANGIOPLASTY;  Surgeon: AWellington Hampshire MD;  [facility];  Service: Cardiovascular;  Laterality: Right;  . SPINE SURGERY    . UMBILICAL HERNIA REPAIR  2009   steel mesh insert    Immunization History  Administered Date(s) Administered  . Influenza Split 11/18/2012  . Influenza,inj,Quad PF,6+ Mos 10/16/2015  . Influenza,inj,quad, With Preservative 11/21/2016  . Influenza-Unspecified 12/05/2014  . Pneumococcal Conjugate-13 08/30/2014  . Pneumococcal Polysaccharide-23 11/18/2009  . Tdap 09/28/2006  . Zoster, Live  09/18/2013    MEDICATIONS/ALLERGIES   Current Meds  Medication Sig  . allopurinol (ZYLOPRIM) 300 MG tablet Take 300 mg by mouth 2 (two) times daily.  .Marland Kitchenaspirin EC 81 MG tablet Take 1 tablet (81 mg total) by mouth daily.  . B-D ULTRAFINE III SHORT PEN 31G X 8 MM MISC Inject 1 each into the skin 3 (three) times daily.  . carvedilol (COREG) 12.5 MG tablet TAKE 1 TABLET (12.5 MG TOTAL) BY MOUTH 2 (TWO) TIMES DAILY WITH A MEAL.  .Marland Kitchenclopidogrel (PLAVIX) 75 MG tablet TAKE 1 TABLET BY MOUTH EVERY DAY  . COLCRYS 0.6 MG tablet Take 0.6 mg by mouth daily.  . Continuous Blood Gluc Sensor (FREESTYLE LIBRE 14 DAY SENSOR) MISC APPLY 1 SENSOR EVERY 14 DAYS  . DULoxetine (CYMBALTA) 60 MG capsule Take 60 mg by mouth daily.  . empagliflozin (JARDIANCE) 25 MG TABS tablet Take 25 mg by mouth daily.   .Marland Kitchenescitalopram (LEXAPRO) 10 MG tablet Take 10 mg by mouth daily.  . furosemide (LASIX) 80 MG tablet Take 160 mg by mouth 3 (three) times daily. Takes by mouth 3 times daily  . gabapentin (NEURONTIN) 800 MG tablet Take by mouth.  . insulin regular human CONCENTRATED (HUMULIN R U-500 KWIKPEN) 500 UNIT/ML kwikpen Inject 110 Units into the skin 2 (two) times daily with a meal.   . levothyroxine (SYNTHROID, LEVOTHROID) 88 MCG tablet Take 88 mcg by mouth daily before breakfast.  . losartan (COZAAR) 50 MG tablet Take 50 mg by mouth daily.  .Marland Kitchenomeprazole (PRILOSEC) 20 MG capsule Take 1 capsule (20 mg total) by mouth daily.  .Marland KitchenOZEMPIC, 1 MG/DOSE, 4 MG/3ML SOPN once a week.  . potassium chloride (MICRO-K) 10 MEQ CR capsule Take 10 mEq by mouth daily.   . rosuvastatin (CRESTOR) 10 MG tablet Take 10 mg by mouth daily.  Allergies  Allergen Reactions  . Septra [Bactrim] Itching  . Penicillins Hives    Allergy to All cillin drugs  Ancef given 9/24 with no obvious reaction  . Clopidogrel     NON RESPONDER  P2Y12  . Metformin And Related Nausea Only    SOCIAL HISTORY/FAMILY HISTORY   Reviewed in Epic:  Pertinent  findings:  Social History   Tobacco Use  . Smoking status: Former Smoker    Packs/day: 1.00    Years: 42.00    Pack years: 42.00    Types: Cigarettes    Quit date: 03/23/2003    Years since quitting: 17.3  . Smokeless tobacco: Never Used  Vaping Use  . Vaping Use: Never used  Substance Use Topics  . Alcohol use: No    Alcohol/week: 0.0 standard drinks  . Drug use: No   Social History   Social History Narrative   He and his partner [name] have now officially gotten married on 05/26/2013.  [name] is also a patient of our clinic.  He is now initially hyphenated his last name.   He has now " retired"from his long-term employment at [employer]  As of September 2020   He does not smoke, he quit in 2009.  He does not drink alcohol.   He was adopted and no family history is known.     OBJCTIVE -PE, EKG, labs   Wt Readings from Last 3 Encounters:  06/22/20 (!) 330 lb (149.7 kg)  12/21/19 (!) 325 lb 11.2 oz (147.7 kg)  11/09/19 (!) 316 lb 9.6 oz (143.6 kg)    Physical Exam: BP 112/66   Pulse 68   Ht _0  (1.854 m)   Wt (!) 330 lb (149.7 kg)   BMI 43.54 kg/m  Physical Exam Vitals reviewed.  Constitutional:      Appearance: Normal appearance. He is obese. He is not ill-appearing or toxic-appearing.     Comments: Well-groomed.  Weight is up from last visit, but stable otherwise.  HENT:     Head: Normocephalic and atraumatic.  Neck:     Vascular: No carotid bruit, hepatojugular reflux or JVD.  Cardiovascular:     Rate and Rhythm: Normal rate and regular rhythm.  No extrasystoles are present.    Chest Wall: PMI is not displaced (Unable to palpate).     Pulses: Decreased pulses (Decreased-faint PT pulses bilaterally -> ).          Dorsalis pedis pulses are 1+ on the right side and 1+ on the left side.       Posterior tibial pulses are 2+ on the right side and 2+ on the left side.     Heart sounds: S1 normal and S2 normal. Heart sounds are distant. No murmur  heard. No friction rub. No gallop.   Pulmonary:     Effort: Pulmonary effort is normal. No respiratory distress.     Breath sounds: Normal breath sounds. No stridor. No wheezing, rhonchi or rales.  Chest:     Chest wall: No tenderness.  Musculoskeletal:        General: Swelling (Trivial ankle edema) present.     Cervical back: Normal range of motion and neck supple.     Comments: Notes that his knees are very stiff.  Skin:    General: Skin is warm and dry.     Comments: Stable/improved bilateral lower extremity venous stasis changes.  Trivial edema.  Neurological:     General: No focal deficit present.  Mental Status: He is alert and oriented to person, place, and time.  Psychiatric:        Mood and Affect: Mood normal.        Behavior: Behavior normal.        Thought Content: Thought content normal.        Judgment: Judgment normal.     Comments: He seems to be in pretty good spirits today.  Trying to avoid all depressing news.  Needs to find a hobby. Looking forward to the weather improving so he can go outside and do some gardening.  He and [name] plan to have planting boxes mounted on the banister of the deck so he can plant and maintain while sitting on a chair as opposed to kneeling down.     Adult ECG Report  Rate: 68 ;  Rhythm: normal sinus rhythm, sinus arrhythmia and Inferior MI, age-indeterminate.;  Nonspecific anterolateral ST And T wave changes.  Narrative Interpretation: Stable EKG.  Recent Labs:    11/23/2019: TC 197, TG 253, HDL 50, LDL 93.  Hgb 16.5.  Cr 1.52,  Lab Results  Component Value Date   CHOL 178 05/12/2018   HDL 42 05/12/2018   LDLCALC 67 05/12/2018   TRIG 343 (H) 05/12/2018   CHOLHDL 4.2 05/12/2018   Lab Results  Component Value Date   CREATININE 1.73 (H) 09/28/2019   BUN 25 09/28/2019   NA 136 09/28/2019   K 4.0 09/28/2019   CL 92 (L) 09/28/2019   CO2 26 09/28/2019   CBC Latest Ref Rng & Units 09/28/2019 05/12/2018 02/20/2018  WBC 3.4 -  10.8 x10E3/uL 5.8 5.6 6.5  Hemoglobin 13.0 - 17.7 g/dL 15.8 16.5 16.4  Hematocrit 37.5 - 51.0 % 46.6 47.1 46.6  Platelets 150 - 450 x10E3/uL 137(L) 170 177    Lab Results  Component Value Date   TSH 1.910 02/20/2018    ==================================================  COVID-19 Education: The signs and symptoms of COVID-19 were discussed with the patient and how to seek care for testing (follow up with PCP or arrange E-visit).    I spent a total of 32 minutes with the patient spent in direct patient consultation.  Additional time spent with chart review  / charting (studies, outside notes, etc): 13 min Total Time: 45 min   Current medicines are reviewed at length with the patient today.  (+/- concerns) N/A  This visit occurred during the SARS-CoV-2 public health emergency.  Safety protocols were in place, including screening questions prior to the visit, additional usage of staff PPE, and extensive cleaning of exam room while observing appropriate contact time as indicated for disinfecting solutions.  Notice: This dictation was prepared with Dragon dictation along with smaller phrase technology. Any transcriptional errors that result from this process are unintentional and may not be corrected upon review.  Patient Instructions / Medication Changes & Studies & Tests Ordered   Patient Instructions  Medication Instructions:   no changes   *If you need a refill on your cardiac medications before your next appointment, please call your pharmacy*   Lab Work: Not needed    Testing/Procedures:  Not needed  Follow-Up: At [facility], you and your health needs are our priority.  As part of our continuing mission to provide you with exceptional heart care, we have created designated Provider Care Teams.  These Care Teams include your primary Cardiologist (physician) and Advanced Practice Providers (APPs -  Physician Assistants and Nurse Practitioners) who all work together to  provide  you with the care you need, when you need it.     Your next appointment:   6 month(s)  The format for your next appointment:   In Person  Provider:   Glenetta Hew, MD  Studies Ordered:   Orders Placed This Encounter  Procedures  . EKG 12-Lead     Glenetta Hew, M.D., M.S. Interventional Cardiologist   Pager # [phone] Phone # [phone] [address] [place], [place] [address]   Thank you for choosing [facility] at [facility]!!

## 2020-07-03 ENCOUNTER — Ambulatory Visit: Payer: Medicare Other | Admitting: Podiatry

## 2020-07-04 ENCOUNTER — Ambulatory Visit: Payer: Medicare Other | Admitting: Podiatry

## 2020-07-04 ENCOUNTER — Other Ambulatory Visit: Payer: Self-pay

## 2020-07-04 DIAGNOSIS — S90829A Blister (nonthermal), unspecified foot, initial encounter: Secondary | ICD-10-CM | POA: Diagnosis not present

## 2020-07-04 DIAGNOSIS — I739 Peripheral vascular disease, unspecified: Secondary | ICD-10-CM | POA: Diagnosis not present

## 2020-07-04 DIAGNOSIS — E1142 Type 2 diabetes mellitus with diabetic polyneuropathy: Secondary | ICD-10-CM | POA: Diagnosis not present

## 2020-07-05 ENCOUNTER — Encounter: Payer: Self-pay | Admitting: Podiatry

## 2020-07-05 NOTE — Progress Notes (Signed)
  Subjective:  Patient ID: [name], male    DOB: 1955-10-30,  MRN: [id number]  Chief Complaint  Patient presents with  . Blister      blister on left foot 2nd toe    65 y.o. male returns with the above complaint. History confirmed with patient.  Began wearing these moisturizing natural socks and left them on overnight and noticed blistering forming on the bilateral hallux and second toes on the left   Objective:  Physical Exam: no trophic changes or ulcerative lesions, reduced sensation at plantar pulps of toes and feet, large spider veins, varicosities and torturous veins in the lower legs along with venous stasis dermatitis noted and +1 PT and DP pulses bilaterally, both feet are warm and well-perfused.  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  There is a callus on the medial hallux. right dorsal fifth PIPJ ulcer has healed with overlying hyperkeratosis.   Today on the left hallux and second toe there is serous blistering that has ruptured on the second toe with maceration of the surrounding skin Assessment:   1. Blister of foot, unspecified laterality, initial encounter   2. PAD (peripheral artery disease) ([place])   3. Diabetic polyneuropathy associated with type 2 diabetes mellitus ([place])      Plan:  Patient was evaluated and treated and all questions answered.  Evaluated the blistered areas on the penetrate the dermis today.  Recommend he apply Betadine paint to help dry the maceration.  Leave open to air.  We will reevaluate at next visit, no indication for antibiotics or further imaging at this point   Return in about 6 weeks (around 08/15/2020).

## 2020-07-06 DIAGNOSIS — E11621 Type 2 diabetes mellitus with foot ulcer: Secondary | ICD-10-CM | POA: Diagnosis not present

## 2020-07-06 DIAGNOSIS — R296 Repeated falls: Secondary | ICD-10-CM | POA: Diagnosis not present

## 2020-07-06 DIAGNOSIS — L97529 Non-pressure chronic ulcer of other part of left foot with unspecified severity: Secondary | ICD-10-CM | POA: Diagnosis not present

## 2020-07-06 DIAGNOSIS — M1990 Unspecified osteoarthritis, unspecified site: Secondary | ICD-10-CM | POA: Diagnosis not present

## 2020-07-10 DIAGNOSIS — R809 Proteinuria, unspecified: Secondary | ICD-10-CM | POA: Diagnosis not present

## 2020-07-10 DIAGNOSIS — G4733 Obstructive sleep apnea (adult) (pediatric): Secondary | ICD-10-CM | POA: Diagnosis not present

## 2020-07-10 DIAGNOSIS — E039 Hypothyroidism, unspecified: Secondary | ICD-10-CM | POA: Diagnosis not present

## 2020-07-10 DIAGNOSIS — I2510 Atherosclerotic heart disease of native coronary artery without angina pectoris: Secondary | ICD-10-CM | POA: Diagnosis not present

## 2020-07-10 DIAGNOSIS — Z794 Long term (current) use of insulin: Secondary | ICD-10-CM | POA: Diagnosis not present

## 2020-07-10 DIAGNOSIS — E1165 Type 2 diabetes mellitus with hyperglycemia: Secondary | ICD-10-CM | POA: Diagnosis not present

## 2020-07-16 ENCOUNTER — Encounter: Payer: Self-pay | Admitting: Cardiology

## 2020-07-16 NOTE — Assessment & Plan Note (Signed)
Referred to Dr. Sophronia Simas.  Has now had angioplasty of the right TP trunk and posterior tibial artery.  Follow-up Doppler showed notable improvement.  No further claudication.  He is now more than 6 months out, we can probably switch him back to low-dose aspirin and low-dose Xarelto in the next follow-up visit.

## 2020-07-16 NOTE — Assessment & Plan Note (Signed)
Related to combination of COPD, obesity, OHS and OSA.  Unsure there is also some diastolic heart failure component.  Continue to treat blood pressure with afterload reduction and standing high-dose diuretic.  I think he would benefit from being back on CPAP.

## 2020-07-16 NOTE — Assessment & Plan Note (Signed)
Well-controlled blood pressure on current dose of losartan and carvedilol.

## 2020-07-16 NOTE — Assessment & Plan Note (Signed)
Mild diastolic heart failure, I think that is not the main reason for his exertional dyspnea.  Mostly related to deconditioning and obesity.  Needs to work on weight loss. On combination of losartan and carvedilol and stable dose.  Blood pressure well controlled. On high-dose furosemide managed by nephrology.  This is in conjunction with Jardiance.

## 2020-07-16 NOTE — Assessment & Plan Note (Signed)
Tolerating simvastatin.  He is on Ozempic and Jardiance for diabetes.  Most recent LDL last fall, was 93.  Due for labs to be checked soon in July.   We can reassess lipids at that time.  I suspect that he may need more aggressive therapy.

## 2020-07-16 NOTE — Assessment & Plan Note (Signed)
He has been doing relatively well until his most recent bout of depression.  Now his weight is up significantly. Excellent hopefully with dietary adjustments and exercising more including doing his gardening, he will hopefully lose some of the weight back.  In the past he had but the possibility of gastric bypass, but is not ready to make that jump at this time.

## 2020-07-16 NOTE — Assessment & Plan Note (Signed)
Stable Dopplers on recent check. Status post right carotid endarterectomy.  On aggressive cardiovascular risk factor modification.

## 2020-07-16 NOTE — Assessment & Plan Note (Signed)
Multivessel PCI back in 2014 (essentially three-vessel disease with LAD, OM 2 and RCA PCI).  He has not had any further angina since then.  Was switched back to aspirin and Plavix after his peripheral vascular procedure. For now we will continue Plavix alone and stop aspirin.  If next follow-up I plan to switch him back to low-dose aspirin plus low-dose Xarelto. He is back on rosuvastatin at lower dose.  Lipids not quite at goal.  We will watch for his follow-up labs in July, and anticipate referral to [facility] to consider potential PCSK9 inhibitor.

## 2020-07-16 NOTE — Assessment & Plan Note (Signed)
Had been on CPAP, but not using routinely. May need to follow-up with sleep medicine.

## 2020-07-16 NOTE — Assessment & Plan Note (Signed)
Pretty well stable.  Dermatitis changes of almost all faded.  When he is able to be put to support socks on, but right now his back is bothering him.  I do want him to continue to wear his support stockings.  He is on high-dose Lasix as well.

## 2020-07-18 ENCOUNTER — Other Ambulatory Visit: Payer: Self-pay | Admitting: Neurosurgery

## 2020-07-18 ENCOUNTER — Telehealth: Payer: Self-pay | Admitting: *Deleted

## 2020-07-18 NOTE — Telephone Encounter (Signed)
[facility] Pre-operative Risk Assessment    Patient Name: Harold [name]  DOB: [birth year]  MRN: [id number]   HEARTCARE STAFF: - Please ensure there is not already an duplicate clearance open for this procedure. - Under Visit Info/Reason for Call, type in Other and utilize the format Clearance MM/DD/YY or Clearance TBD. Do not use dashes or single digits. - If request is for dental extraction, please clarify the # of teeth to be extracted. - If the patient is currently at the dentist's office, call Pre-Op APP to address. If the patient is not currently in the dentist office, please route to the Pre-Op pool  Request for surgical clearance:  1. What type of surgery is being performed? L2-3 laminectomy/foraminotomy   2. When is this surgery scheduled? 07/26/20   3. What type of clearance is required (medical clearance vs. Pharmacy clearance to hold med vs. Both)? both  4. Are there any medications that need to be held prior to surgery and how long?plavix-they need direction   5. Practice name and name of physician performing surgery? [facility]   6. What is the office phone number? [phone]   7.   What is the office fax number? [phone]  8.   Anesthesia type (None, local, MAC, general) ? general   Fredia Beets 07/18/2020, 2:54 PM  _________________________________________________________________   (provider comments below)

## 2020-07-18 NOTE — Telephone Encounter (Signed)
    Harold [name] DOB:  [birth year]  MRN:  [id number]   Primary Cardiologist: Glenetta Hew, MD  Chart reviewed as part of pre-operative protocol coverage.   Patient has a history of CAD s/p PCI/DES to LAD, OM2, and RCA in 2014 after refusing CABG. He has not had anginal complaints since that time. More recently he underwent angioplasy of the right TP trunk and posterior tibial artery 09/2019.   Dr. Ellyn Hack, can you comment on recommendations for holding plavix prior to upcoming back surgery? Please route your response back to P CV [facility] PREOP. Thank you!  Abigail Butts, PA-C 07/18/2020, 3:04 PM

## 2020-07-19 DIAGNOSIS — M25561 Pain in right knee: Secondary | ICD-10-CM | POA: Diagnosis not present

## 2020-07-19 DIAGNOSIS — M1990 Unspecified osteoarthritis, unspecified site: Secondary | ICD-10-CM | POA: Diagnosis not present

## 2020-07-19 DIAGNOSIS — M25559 Pain in unspecified hip: Secondary | ICD-10-CM | POA: Diagnosis not present

## 2020-07-19 DIAGNOSIS — E1140 Type 2 diabetes mellitus with diabetic neuropathy, unspecified: Secondary | ICD-10-CM | POA: Diagnosis not present

## 2020-07-19 DIAGNOSIS — N289 Disorder of kidney and ureter, unspecified: Secondary | ICD-10-CM | POA: Diagnosis not present

## 2020-07-19 DIAGNOSIS — M109 Gout, unspecified: Secondary | ICD-10-CM | POA: Diagnosis not present

## 2020-07-19 DIAGNOSIS — Z79899 Other long term (current) drug therapy: Secondary | ICD-10-CM | POA: Diagnosis not present

## 2020-07-20 NOTE — Telephone Encounter (Signed)
    [name] DOB:  10-26-1955  MRN:  [id number]   Primary Cardiologist: Glenetta Hew, MD  Chart reviewed as part of pre-operative protocol coverage. Given past medical history and time since last visit, based on ACC/AHA guidelines, KALADIN NOSEWORTHY would be at acceptable risk for the planned procedure without further cardiovascular testing. He was last seen by Dr. Ellyn Hack 06/22/20 doing well from a cardiac perspective.   Per Dr. Ellyn Hack, "So he is now on Plavix because of his recent peripheral intervention. He had previously been on low-dose Xarelto and aspirin.   For Plavix, we routinely would say hold 5 days preop, but for spinal procedure I usually say hold 7 days preop, and then restart when the surgeon says it is safe to start back -> usually 1 or 2 days postop". I left a voicemail on his cell phone with detailed instructions per DPR.   The patient was advised that if he develops new symptoms prior to surgery to contact our office to arrange for a follow-up visit, and he verbalized understanding.  I will route this recommendation to the requesting party via Epic fax function and remove from pre-op pool.  Please call with questions.  Loel Dubonnet, NP 07/20/2020, 8:54 AM

## 2020-07-21 NOTE — Pre-Procedure Instructions (Signed)
Surgical Instructions:    Your procedure is scheduled on Wednesday, June 8th (08:30 AM- 10:25 AM).  Report to [facility] Main Entrance "A" at 06:30 A.M., then check in with the Admitting office.  Call this number if you have any questions prior to, or have any problems the morning of surgery:  [phone]    Remember:  Do not eat or drink after midnight the night before your surgery.     Take these medicines the morning of surgery with A SIP OF WATER: allopurinol (ZYLOPRIM) carvedilol (COREG) COLCRYS DULoxetine (CYMBALTA) escitalopram (LEXAPRO) gabapentin (NEURONTIN) levothyroxine (SYNTHROID, LEVOTHROID) omeprazole (PRILOSEC) rosuvastatin (CRESTOR)  IF NEEDED: nitroGLYCERIN (NITROSTAT)   *Per your Doctor's instructions, STOP clopidogrel (PLAVIX) 7 days prior to surgery.   As of today, STOP taking any Aspirin (unless otherwise instructed by your surgeon) Aleve, Naproxen, Ibuprofen, Motrin, Advil, Goody's, BC's, all herbal medications, fish oil, and all vitamins.           WHAT DO I DO ABOUT MY DIABETES MEDICATION?  THE NIGHT BEFORE SURGERY: . Do not take bedtime dose of insulin regular human CONCENTRATED (HUMULIN R).      THE DAY OF SURGERY: . Do not take other diabetes injectables, including OZEMPIC. . If your CBG is greater than 220 mg/dL, you may take  of your sliding scale (correction) dose of insulin regular human CONCENTRATED (HUMULIN R).    HOW TO MANAGE YOUR DIABETES BEFORE AND AFTER SURGERY  Why is it important to control my blood sugar before and after surgery? . Improving blood sugar levels before and after surgery helps healing and can limit problems. . A way of improving blood sugar control is eating a healthy diet by: o  Eating less sugar and carbohydrates o  Increasing activity/exercise o  Talking with your doctor about reaching your blood sugar goals . High blood sugars (greater than 180 mg/dL) can raise your risk of infections and slow your  recovery, so you will need to focus on controlling your diabetes during the weeks before surgery. . Make sure that the doctor who takes care of your diabetes knows about your planned surgery including the date and location.  How do I manage my blood sugar before surgery? . Check your blood sugar at least 4 times a day, starting 2 days before surgery, to make sure that the level is not too high or low.  . Check your blood sugar the morning of your surgery when you wake up and every 2 hours until you get to the Short Stay unit.  o If your blood sugar is less than 70 mg/dL, you will need to treat for low blood sugar: - Do not take insulin. - Treat a low blood sugar (less than 70 mg/dL) with  cup of clear juice (cranberry or apple), 4 glucose tablets, OR glucose gel. - Recheck blood sugar in 15 minutes after treatment (to make sure it is greater than 70 mg/dL). If your blood sugar is not greater than 70 mg/dL on recheck, call [phone] for further instructions. . Report your blood sugar to the short stay nurse when you get to Short Stay.  . If you are admitted to the hospital after surgery: o Your blood sugar will be checked by the staff and you will probably be given insulin after surgery (instead of oral diabetes medicines) to make sure you have good blood sugar levels. o The goal for blood sugar control after surgery is 80-180 mg/dL.     Special instructions:  [place]- Preparing For Surgery  Before surgery, you can play an important role. Because skin is not sterile, your skin needs to be as free of germs as possible. You can reduce the number of germs on your skin by washing with CHG (chlorahexidine gluconate) Soap before surgery.  CHG is an antiseptic cleaner which kills germs and bonds with the skin to continue killing germs even after washing.    Oral Hygiene is also important to reduce your risk of infection.  Remember - BRUSH YOUR TEETH THE MORNING OF SURGERY WITH YOUR REGULAR  TOOTHPASTE  Please do not use if you have an allergy to CHG or antibacterial soaps. If your skin becomes reddened/irritated stop using the CHG.  Do not shave (including legs and underarms) for at least 48 hours prior to first CHG shower. It is OK to shave your face.  Please follow these instructions carefully.   1. Shower the NIGHT BEFORE SURGERY and the MORNING OF SURGERY  2. If you chose to wash your hair, wash your hair first as usual with your normal shampoo.  3. After you shampoo, rinse your hair and body thoroughly to remove the shampoo.  4. Wash Face and genitals (private parts) with your normal soap.   5. Use CHG Soap as you would any other liquid soap. You can apply CHG directly to the skin and wash gently with a scrungie or a clean washcloth.   6. Apply the CHG Soap to your body ONLY FROM THE NECK DOWN.  Do not use on open wounds or open sores. Avoid contact with your eyes, ears, mouth and genitals (private parts). Wash Face and genitals (private parts)  with your normal soap.   7. Wash thoroughly, paying special attention to the area where your surgery will be performed.  8. Thoroughly rinse your body with warm water from the neck down.  9. DO NOT shower/wash with your normal soap after using and rinsing off the CHG Soap.  10. Pat yourself dry with a CLEAN TOWEL.  11. Wear CLEAN PAJAMAS to bed the night before surgery.  12. Place CLEAN SHEETS on your bed the night before your surgery.  13. DO NOT SLEEP WITH PETS.   Day of Surgery: SHOWER with CHG soap. Brush your teeth WITH YOUR REGULAR TOOTHPASTE. Wear Clean/Comfortable clothing the morning of surgery. Do not apply any deodorants/lotions.   Do not wear jewelry. Men may shave face and neck. Do not bring valuables to the hospital. [facility] is not responsible for any belongings or valuables.   Do NOT Smoke (Tobacco/Vaping) or drink Alcohol 24 hours prior to your procedure.  If you use a CPAP at night, you may  bring all equipment for your overnight stay.   Contacts, glasses, or dentures may not be worn into surgery, please bring cases for these belongings.   For patients admitted to the hospital, discharge time will be determined by your treatment team.   Patients discharged the day of surgery will not be allowed to drive home, and someone needs to stay with them for 24 hours.    Please read over the following fact sheets that you were given.

## 2020-07-24 ENCOUNTER — Other Ambulatory Visit: Payer: Self-pay

## 2020-07-24 ENCOUNTER — Encounter: Payer: Self-pay | Admitting: Neurology

## 2020-07-24 ENCOUNTER — Encounter (HOSPITAL_COMMUNITY): Payer: Self-pay

## 2020-07-24 ENCOUNTER — Ambulatory Visit: Payer: Medicare Other | Admitting: Neurology

## 2020-07-24 ENCOUNTER — Encounter (HOSPITAL_COMMUNITY)
Admission: RE | Admit: 2020-07-24 | Discharge: 2020-07-24 | Disposition: A | Discharge status: Home / Self Care | Payer: Medicare Other | Source: Ambulatory Visit | Attending: Neurosurgery | Admitting: Neurosurgery

## 2020-07-24 VITALS — BP 138/74 | HR 74 | Ht 73.0 in | Wt 331.5 lb

## 2020-07-24 DIAGNOSIS — I10 Essential (primary) hypertension: Secondary | ICD-10-CM | POA: Diagnosis not present

## 2020-07-24 DIAGNOSIS — J449 Chronic obstructive pulmonary disease, unspecified: Secondary | ICD-10-CM | POA: Diagnosis not present

## 2020-07-24 DIAGNOSIS — I5032 Chronic diastolic (congestive) heart failure: Secondary | ICD-10-CM

## 2020-07-24 DIAGNOSIS — E1151 Type 2 diabetes mellitus with diabetic peripheral angiopathy without gangrene: Secondary | ICD-10-CM | POA: Diagnosis not present

## 2020-07-24 DIAGNOSIS — Z955 Presence of coronary angioplasty implant and graft: Secondary | ICD-10-CM | POA: Insufficient documentation

## 2020-07-24 DIAGNOSIS — E785 Hyperlipidemia, unspecified: Secondary | ICD-10-CM | POA: Diagnosis not present

## 2020-07-24 DIAGNOSIS — Z79890 Hormone replacement therapy (postmenopausal): Secondary | ICD-10-CM | POA: Insufficient documentation

## 2020-07-24 DIAGNOSIS — N1832 Chronic kidney disease, stage 3b: Secondary | ICD-10-CM | POA: Diagnosis not present

## 2020-07-24 DIAGNOSIS — M25552 Pain in left hip: Secondary | ICD-10-CM | POA: Diagnosis not present

## 2020-07-24 DIAGNOSIS — K219 Gastro-esophageal reflux disease without esophagitis: Secondary | ICD-10-CM | POA: Diagnosis not present

## 2020-07-24 DIAGNOSIS — I2510 Atherosclerotic heart disease of native coronary artery without angina pectoris: Secondary | ICD-10-CM | POA: Diagnosis not present

## 2020-07-24 DIAGNOSIS — M48062 Spinal stenosis, lumbar region with neurogenic claudication: Secondary | ICD-10-CM | POA: Insufficient documentation

## 2020-07-24 DIAGNOSIS — Z9989 Dependence on other enabling machines and devices: Secondary | ICD-10-CM | POA: Insufficient documentation

## 2020-07-24 DIAGNOSIS — Z79899 Other long term (current) drug therapy: Secondary | ICD-10-CM | POA: Diagnosis not present

## 2020-07-24 DIAGNOSIS — Z20822 Contact with and (suspected) exposure to covid-19: Secondary | ICD-10-CM | POA: Diagnosis not present

## 2020-07-24 DIAGNOSIS — M25551 Pain in right hip: Secondary | ICD-10-CM | POA: Diagnosis not present

## 2020-07-24 DIAGNOSIS — N189 Chronic kidney disease, unspecified: Secondary | ICD-10-CM | POA: Insufficient documentation

## 2020-07-24 DIAGNOSIS — Z7902 Long term (current) use of antithrombotics/antiplatelets: Secondary | ICD-10-CM | POA: Diagnosis not present

## 2020-07-24 DIAGNOSIS — Z6841 Body Mass Index (BMI) 40.0 and over, adult: Secondary | ICD-10-CM | POA: Insufficient documentation

## 2020-07-24 DIAGNOSIS — I2582 Chronic total occlusion of coronary artery: Secondary | ICD-10-CM | POA: Insufficient documentation

## 2020-07-24 DIAGNOSIS — Z981 Arthrodesis status: Secondary | ICD-10-CM | POA: Insufficient documentation

## 2020-07-24 DIAGNOSIS — E1140 Type 2 diabetes mellitus with diabetic neuropathy, unspecified: Secondary | ICD-10-CM | POA: Diagnosis not present

## 2020-07-24 DIAGNOSIS — E039 Hypothyroidism, unspecified: Secondary | ICD-10-CM | POA: Diagnosis not present

## 2020-07-24 DIAGNOSIS — I6521 Occlusion and stenosis of right carotid artery: Secondary | ICD-10-CM | POA: Insufficient documentation

## 2020-07-24 DIAGNOSIS — G4733 Obstructive sleep apnea (adult) (pediatric): Secondary | ICD-10-CM | POA: Diagnosis not present

## 2020-07-24 DIAGNOSIS — E1122 Type 2 diabetes mellitus with diabetic chronic kidney disease: Secondary | ICD-10-CM | POA: Diagnosis not present

## 2020-07-24 DIAGNOSIS — I272 Pulmonary hypertension, unspecified: Secondary | ICD-10-CM | POA: Insufficient documentation

## 2020-07-24 DIAGNOSIS — Z01812 Encounter for preprocedural laboratory examination: Secondary | ICD-10-CM | POA: Diagnosis not present

## 2020-07-24 DIAGNOSIS — Z794 Long term (current) use of insulin: Secondary | ICD-10-CM | POA: Insufficient documentation

## 2020-07-24 DIAGNOSIS — Z7984 Long term (current) use of oral hypoglycemic drugs: Secondary | ICD-10-CM | POA: Insufficient documentation

## 2020-07-24 LAB — CBC
HCT: 44.9 % (ref 39.0–52.0)
Hemoglobin: 15.8 g/dL (ref 13.0–17.0)
MCH: 31.5 pg (ref 26.0–34.0)
MCHC: 35.2 g/dL (ref 30.0–36.0)
MCV: 89.4 fL (ref 80.0–100.0)
Platelets: 185 10*3/uL (ref 150–400)
RBC: 5.02 MIL/uL (ref 4.22–5.81)
RDW: 14.1 % (ref 11.5–15.5)
WBC: 9.1 10*3/uL (ref 4.0–10.5)
nRBC: 0 % (ref 0.0–0.2)

## 2020-07-24 LAB — BASIC METABOLIC PANEL
Anion gap: 12 (ref 5–15)
BUN: 38 mg/dL — ABNORMAL HIGH (ref 8–23)
CO2: 29 mmol/L (ref 22–32)
Calcium: 8.9 mg/dL (ref 8.9–10.3)
Chloride: 94 mmol/L — ABNORMAL LOW (ref 98–111)
Creatinine, Ser: 1.89 mg/dL — ABNORMAL HIGH (ref 0.61–1.24)
GFR, Estimated: 39 mL/min — ABNORMAL LOW (ref >60)
Glucose, Bld: 195 mg/dL — ABNORMAL HIGH (ref 70–99)
Potassium: 3.5 mmol/L (ref 3.5–5.1)
Sodium: 135 mmol/L (ref 135–145)

## 2020-07-24 LAB — SURGICAL PCR SCREEN
MRSA, PCR: NEGATIVE
Staphylococcus aureus: NEGATIVE

## 2020-07-24 LAB — GLUCOSE, CAPILLARY: Glucose-Capillary: 194 mg/dL — ABNORMAL HIGH (ref 70–99)

## 2020-07-24 NOTE — Progress Notes (Signed)
PCP -  Dr. Shelia Media  Cardiologist - Dr. Glenetta Hew  PPM/ICD - n/a Device Orders - n/a Rep Notified - n/a  Chest x-ray - n/a EKG - 06/22/20 Stress Test - 07/20/07 ECHO - 04/28/12 Cardiac Cath - 05/13/13  Sleep Study - Yes. 2 years ago with Dr. Asencion Partridge Dohmier CPAP - Yes. Wears nightly  CBG today- 194. Patient states he had a cortisone shot in his right knee on 07/19/20 Checks Blood Sugar 10-15 times a day. Freestyle Libre. Average 180's per patient  Blood Thinner Instructions: Hold Plavix 7 days prior to surgery. Patient confirmed he has stopped Plavix.  Aspirin Instructions: n/a  COVID TEST- 07/24/20. Pending    Anesthesia review: Yes. Heart history. Cardiac clearance in Epic 07/20/20  Patient denies shortness of breath, fever, cough and chest pain at PAT appointment   All instructions explained to the patient, with a verbal understanding of the material. Patient agrees to go over the instructions while at home for a better understanding. Patient also instructed to self quarantine after being tested for COVID-19. The opportunity to ask questions was provided.
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
## 2020-07-24 NOTE — Patient Instructions (Signed)
Hypersomnia Hypersomnia is a condition in which a person feels very tired during the day even though he or she gets plenty of sleep at night. A person with this condition may take naps during the day and may find it very difficult to wake up from sleep. Hypersomnia may affect a person's ability to think, concentrate, drive, or remember things. What are the causes? The cause of this condition may not be known. Possible causes include:  Certain medicines.  Sleep disorders, such as narcolepsy and sleep apnea.  Injury to the head, brain, or spinal cord.  Drug or alcohol use.  Gastroesophageal reflux disease (GERD).  Tumors.  Certain medical conditions, such as depression, diabetes, or an underactive thyroid gland (hypothyroidism). What are the signs or symptoms? The main symptoms of hypersomnia include:  Feeling very tired throughout the day, regardless of how much sleep you got the night before.  Having trouble waking up. Others may find it difficult to wake you up when you are sleeping.  Sleeping for longer and longer periods at a time.  Taking naps throughout the day. Other symptoms may include:  Feeling restless, anxious, or annoyed.  Lacking energy.  Having trouble with: ? Remembering. ? Speaking. ? Thinking.  Loss of appetite.  Seeing, hearing, tasting, smelling, or feeling things that are not real (hallucinations). How is this diagnosed? This condition may be diagnosed based on:  Your symptoms and medical history.  Your sleeping habits. Your health care provider may ask you to write down your sleeping habits in a daily sleep log, along with any symptoms you have.  A series of tests that are done while you sleep (sleep study or polysomnogram).  A test that measures how quickly you can fall asleep during the day (daytime nap study or multiple sleep latency test). How is this treated? Treatment can help you manage your condition. Treatment may  include:  Following a regular sleep routine.  Lifestyle changes, such as changing your eating habits, getting regular exercise, and avoiding alcohol or caffeinated beverages.  Taking medicines to make you more alert (stimulants) during the day.  Treating any underlying medical causes of hypersomnia. Follow these instructions at home: Sleep routine  Schedule the same bedtime and wake-up time each day.  Practice a relaxing bedtime routine. This may include reading, meditation, deep breathing, or taking a warm bath before going to sleep.  Get regular exercise each day. Avoid strenuous exercise in the evening hours.  Keep your sleep environment at a cooler temperature, darkened, and quiet.  Sleep with pillows and a mattress that are comfortable and supportive.  Schedule short 20-minute naps for when you feel sleepiest during the day.  Talk with your employer or teachers about your hypersomnia. If possible, adjust your schedule so that: ? You have a regular daytime work schedule. ? You can take a scheduled nap during the day. ? You do not have to work or be active at night.  Do not eat a heavy meal for a few hours before bedtime. Eat your meals at about the same times every day.  Avoid drinking alcohol or caffeinated beverages.   Safety  Do not drive or use heavy machinery if you are sleepy. Ask your health care provider if it is safe for you to drive.  Wear a life jacket when swimming or spending time near water.   General instructions  Take supplements and over-the-counter and prescription medicines only as told by your health care provider.  Keep a sleep log that  will help your doctor manage your condition. This may include information about: ? What time you go to bed each night. ? How often you wake up at night. ? How many hours you sleep at night. ? How often and for how long you nap during the day. ? Any observations from others, such as leg movements during sleep,  sleep walking, or snoring.  Keep all follow-up visits as told by your health care provider. This is important. Contact a health care provider if:  You have new symptoms.  Your symptoms get worse. Get help right away if:  You have serious thoughts about hurting yourself or someone else. If you ever feel like you may hurt yourself or others, or have thoughts about taking your own life, get help right away. You can go to your nearest emergency department or call:  Your local emergency services (911 in the [place]  A suicide crisis helpline, such as the [place] at 2892962978. This is open 24 hours a day. Summary  Hypersomnia refers to a condition in which you feel very tired during the day even though you get plenty of sleep at night.  A person with this condition may take naps during the day and may find it very difficult to wake up from sleep.  Hypersomnia may affect a person's ability to think, concentrate, drive, or remember things.  Treatment, such as following a regular sleep routine and making some lifestyle changes, can help you manage your condition. This information is not intended to replace advice given to you by your health care provider. Make sure you discuss any questions you have with your health care provider. Document Revised: 12/16/2019 Document Reviewed: 12/16/2019 Elsevier Patient Education  2021 [employer].

## 2020-07-25 ENCOUNTER — Other Ambulatory Visit: Payer: Self-pay | Admitting: *Deleted

## 2020-07-25 LAB — SARS CORONAVIRUS 2 (TAT 6-24 HRS): SARS Coronavirus 2: NEGATIVE

## 2020-07-25 LAB — HEMOGLOBIN A1C
Hgb A1c MFr Bld: 9.4 % — ABNORMAL HIGH (ref 4.8–5.6)
Mean Plasma Glucose: 223 mg/dL

## 2020-07-25 MED ORDER — VANCOMYCIN HCL 1500 MG/300ML IV SOLN
1500.0000 mg | INTRAVENOUS | Status: AC
  Administered 2020-07-26 (×2): 1500 mg via INTRAVENOUS
  Filled 2020-07-25: qty 300

## 2020-07-25 NOTE — Progress Notes (Signed)
Anesthesia Chart Review:  Case: [id number] Date/Time: 07/26/20 0815   Procedure: Lumbar 2-3 Laminectomy/Foraminotomy (N/A )   Anesthesia type: General   Pre-op diagnosis: Lumbar stenosis with neurogenic claudication   [name] ROOM 20 / [place] [facility]   Surgeons: Ashok Pall, MD      DISCUSSION: Patient is a 65 year old male scheduled for the above procedure.  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 BMI is consistent with morbid obesity.   Preoperative cardiology input outlined by Laurann Montana, NP, "Chart reviewed as part of pre-operative protocol coverage. Given past medical history and time since last visit, based on ACC/AHA guidelines, TERUO STILLEY would be at acceptable risk for the planned procedure without further cardiovascular testing. He was last seen by Dr. Ellyn Hack 06/22/20 doing well from a cardiac perspective.   Per Dr. Ellyn Hack, 'So he is now on Plavix because of his recent peripheral intervention. He had previously been on low-dose Xarelto and aspirin.   For Plavix, we routinely would say hold 5 days preop, but for spinal procedure I usually say hold 7 days preop, and then restart when the surgeon says it is safe to start back -> usually 1 or 2 days postop'".Marland KitchenMarland KitchenPatient holding Plavix for 7 days prior to surgery.    Creatinine 1.89 on 07/24/20, previously 1.57-1.99 since 2019 based on labs in [facility]. Known CKD that has been followed at [facility].  A1c 9.4%, consistent with average glucose of 223. Reported cortisone injection right knee 07/19/20. Has a [employer] and reports average glucose in the 180's. These results called to [facility] at Dr. Lacy Duverney office.   07/24/2020 presurgical COVID-19 test negative. Anesthesia team to evaluate on the day of surgery. He will get a CBG on arrival.    VS: BP 139/80   Pulse 68   Temp 36.5 C (Oral)   Resp 18   Ht _0  (1.854 m)   Wt (!) 153.8 kg   SpO2 98%   BMI 44.73 kg/m    PROVIDERS: Deland Pretty, MD is PCP Dohmeier, Asencion Partridge, MD is neurologist ([facility]). Last visit 07/24/20. Glenetta Hew, MD is cardiologist. Last visit 07/16/20. Kathlyn Sacramento, MD is PV cardiologist Ruta Hinds, MD is vascular surgeon. Last visit 12/21/19 with Julio Sicks, PA-C. Mauricia Area, MD is nephrologist Jacelyn Pi, MD is endocrionlogist   LABS: Preoperative labs notes. A1c 1.89, previously 1.73. A1c 9.4%, consistent with average glucose of 223. (all labs ordered are listed, but only abnormal results are displayed)  Labs Reviewed  GLUCOSE, CAPILLARY - Abnormal; Notable for the following components:      Result Value   Glucose-Capillary 194 (*)    All other components within normal limits  BASIC METABOLIC PANEL - Abnormal; Notable for the following components:   Chloride 94 (*)    Glucose, Bld 195 (*)    BUN 38 (*)    Creatinine, Ser 1.89 (*)    GFR, Estimated 39 (*)    All other components  within normal limits  HEMOGLOBIN A1C - Abnormal; Notable for the following components:   Hgb A1c MFr Bld 9.4 (*)    All other components within normal limits  SURGICAL PCR SCREEN  SARS CORONAVIRUS 2 (TAT 6-24 HRS)  CBC    Spirometry 03/14/16: FVC 3.16  (60%), post 3.26 (62%). FEV1 2.38 (60%), post 2.45 ( 62%).   IMAGES: MRI L-spine  02/08/20: IMPRESSION: 1. Severe canal and subarticular recess stenosis at L2-L3 secondary to degenerative changes and prominent dorsal epidural lipomatosis. The degree of stenosis has progressed slightly since the prior. 2. Mild foraminal stenosis bilaterally at L2-L3 and L3-L4. Mild canal stenosis at L1-L2. 3. Posterior fusion from L4 through S1 without evidence of significant central canal or foraminal stenosis. Similar osteophytic spurring on the left at L5-S1 which appears to contact the descending left S1 nerve root.   EKG: 06/22/20: Normal sinus rhythm with sinus arrhythmia Inferior infarct, age undetermined   CV: Carotid US 12/21/19: Summary:  Right Carotid: Velocities in the right ICA are consistent with a 1-39%  stenosis.         Non-hemodynamically significant plaque <50% noted in the  CCA. The         ECA appears >50% stenosed.  Left Carotid: Velocities in the left ICA are consistent with a 40-59%  stenosis.        Hemodynamically significant plaque >50% visualized in the  CCA. The        ECA appears >50% stenosed.  Vertebrals: Bilateral vertebral arteries demonstrate antegrade flow.  Subclavians: Normal flow hemodynamics were seen in bilateral subclavian        arteries.    RHC/LHC 05/15/13: HEMODYNAMICS: RA: mean 13 RV: 46/12 PA: 46/16 mean 33 Pc: mean 22 v wave 34  Ao: 170/83 LV 170/24  Simultaneous LV 166/27 and RV 45/18  Cardiac output: 6.2 (thermodilution); 5.9 (Fick) Cardiac index:   2.4                            2.2   ANGIOGRAPHY: 1. Left main: Angiographically normal vessel which bifurcated into the LAD and left circumflex coronary artery 2. LAD: Widely patent mid LAD stent. The LAD gave rise to 3 diagonal vessels. At the apex there was a previously noted 90% focal stenosis in a small caliber apical LAD vessel 3. Left circumflex: Gave rise to a proximal high marginal vessel. There was a stent in the  proximal circumflex which was widely patent and another stent to the mid circumflex which also was widely patent. In between the 2 stented segments there was the previously noted subtotal small caliber marginal branch.  4. Right coronary artery: Widely patent proximal MID and distal stents. There is evidence for 70-80% stenosis in the small caliber continuation branch of the RCA after the PDA takeoff. This was present on the previous angiograms of March 2014.  Left ventriculography revealed moderate left ventricle hypertrophy with an ejection fraction of 55% without focal segmental wall motion abnormalities.  IMPRESSION: - Normal LV contractility with moderate left ventricular hypertrophy. - Previously noted multivessel coronary artery disease with evidence for widely patent mid LAD stent and a 90% apical LAD stenosis; widely patent proximal and mid left circumflex stents with previously noted subtotal occlusion of a small OM 2 vessel; and widely patent RCA ostial, mid, and distal stents with previously noted 70-80% stenosis in the continuation branch of the RCA. - Elevated right heart pressures with moderate pulmonary hypertension. -  Systemic hypertension.  RECOMMENDATION: Medical therapy.   Echo 04/28/12: Study Conclusions  - Procedure narrative: Transthoracic echocardiography. Image  quality was poor. The study was technically difficult, as  a result of poor acoustic windows, poor sound wave  transmission, and body habitus.  - Left ventricle: The cavity size was normal. There was mild  concentric hypertrophy. Systolic function was normal. The  estimated ejection fraction was in the range of 60% to  65%. The study is not technically sufficient to allow  evaluation of LV diastolic function.  - Aortic valve: Trivial regurgitation.  - Left atrium: Severely dilated (40 ml/m2).  - Right atrium: The atrium was mildly dilated.  - Tricuspid valve: Mild regurgitation.  -  Pulmonary arteries: PA peak pressure: 26m Hg (S).  - Inferior vena cava: The vessel was dilated; the  respirophasic diameter changes were blunted (< 50%);  findings are consistent with elevated central venous  pressure.    Past Medical History:  Diagnosis Date  . Anginal pain ([facility] March 2015   Cardiac cath showed patent stents with distal LAD, circumflex-OM and RCA disease in small vessels.  . Anxiety   . CAD S/P percutaneous coronary angioplasty 2003, 04/2012   status post PCI to LAD, circumflex-OM 2, RCA  . Carotid artery occlusion   . Chronic renal insufficiency, stage II (mild)   . Chronic venous insufficiency    varicosities, no reflux; dopplers 04/14/12- valvular insufficiency in the R and L GSV  . Complication of anesthesia   . COPD, mild ([place]   . Depression    situaltional   . Diabetes mellitus   . Diabetic neuropathy ([place] 08/24/2019  . Dyslipidemia associated with type 2 diabetes mellitus ([place]   . GERD (gastroesophageal reflux disease)   . HTN (hypertension)   . Hypothyroidism   . Neuromuscular disorder (HCC)    neuropathy in feet  . Neuropathy    notably improved following PCI with improved cardiac function  . Obesity   . 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 PONV (postoperative nausea and vomiting)    "[place]"  . Pulmonary hypertension ([place] 04/2012   PA pressure 572mg  . Sleep apnea    uses nightly    Past Surgical History:  Procedure Laterality Date  . ABDOMINAL AORTAGRAM N/A 11/15/2011   Procedure: ABDOMINAL AOMaxcine Ham Surgeon: ChElam DutchMD;  [facility];  Service: Cardiovascular;  [name];  . ABDOMINAL AORTOGRAM W/LOWER EXTREMITY N/A 10/13/2019   Procedure: ABDOMINAL AORTOGRAM W/LOWER EXTREMITY;  Surgeon: ArWellington HampshireMD;  [facility];  Service: Cardiovascular;  [name];  . ABIs  04/27/2012   mild  bilateral arterial insufficiency  . BACK SURGERY  2005 x1   X2-2010  . BUNIONECTOMY Right 11/11/2013   Procedure: RIGHT FOOT SILVER BUNIONECTOMY;  Surgeon: JoWylene SimmerMD;  [place] Service: Orthopedics;  Laterality: Right;  . CARDIAC CATHETERIZATION  12/11/2001   significant 3V CAD, normal LV function  . Carotid Doppler  04/27/2012   right internal carotid: Elevated velocities but no evidence of plaque. Left internal carotid 40-59%  . CAROTID ENDARTERECTOMY  2005   Right; recent carotid Dopplers notes elevated velocities.   . colonscopy    . CORONARY ANGIOPLASTY  12/21/2001   PTCA of the distal and mid AV groove circ, unsuccessful PTCA of second OM total occlusion, unsuccessful PTCA of  the apical LAD total occlusion  . CORONARY ANGIOPLASTY WITH STENT PLACEMENT  04/29/12   PCI to 3 RCA lesions, Promus Premiere 2.74mx8mm distally, mid was 2.567m8mm and proximally 2.75x1675mEF 55-60%  . CORONARY STENT PLACEMENT  04/28/12   PCI to LAD (3x23m45mence DES postdilated to 3.25) and circ prox and mid (2 overlappinmg 2.5mmx46mmXience DES postdilated to 2.75mm)53mDOPPLER ECHOCARDIOGRAPHY  04/28/2012   poor quality study: EF estimated 60-65%; unable to assess diastolic function (previously noted to have diastolic dysfunction); severely dilated left atrium and mild right atrium; dilated IVC consistent with increased central venous pressure..  . HERNIA REPAIR    . LEFT AND RIGHT HEART CATHETERIZATION WITH CORONARY ANGIOGRAM N/A 04/27/2012   Procedure: LEFT AND RIGHT HEART CATHETERIZATION WITH CORONARY ANGIOGRAM;  Surgeon: David Leonie Man [facility];  Service: Cardiovascular;  [name];  . LEFT AND RIGHT HEART CATHETERIZATION WITH CORONARY ANGIOGRAM N/A 05/14/2013   Procedure: LEFT AND RIGHT HEART CATHETERIZATION WITH CORONARY ANGIOGRAM;  Surgeon: ThomasTroy Sine [facility]: Moderate Pulm HTN: 46/16 - mean 33 mmHg; PCWP 22mmHg27multivessel  CAD with widely patent mid LAD stents and 90% apical LAD, [name], Patent RCA ostial mid and distal stents - 70% distal runoff Dz  . LUMBAR LAMINECTOMY/DECOMPRESSION MICRODISCECTOMY  01/07/2012   Procedure: LUMBAR LAMINECTOMY/DECOMPRESSION MICRODISCECTOMY 1 LEVEL;  Surgeon: Kyle L Winfield CunasLocation: [facility] NEURO ORS;  Service: Neurosurgery;  Laterality: Bilateral;   Lumbar Three-Four Decompression  . PERCUTANEOUS CORONARY STENT INTERVENTION (PCI-S) N/A 04/28/2012   Procedure: PERCUTANEOUS CORONARY STENT INTERVENTION (PCI-S);  Surgeon: Thomas Troy SineLocation[facility];  Service: Cardiovascular;  [name];  . PERCUTANEOUS CORONARY STENT INTERVENTION (PCI-S) N/A 04/29/2012   Procedure: PERCUTANEOUS CORONARY STENT INTERVENTION (PCI-S);  Surgeon: David WLeonie ManLocation: [facility];  Service: Cardiovascular;  [name];  . PERIPHERAL VASCULAR BALLOON ANGIOPLASTY Right 10/13/2019   Procedure: PERIPHERAL VASCULAR BALLOON ANGIOPLASTY;  Surgeon: Arida, Wellington HampshireLocation[facility];  Service: Cardiovascular;  Laterality: Right;  . SPINE SURGERY    . UMBILICAL HERNIA REPAIR  2009   steel mesh insert    MEDICATIONS: . allopurinol (ZYLOPRIM) 100 MG tablet  . B-D ULTRAFINE III SHORT PEN 31G X 8 MM MISC  . carvedilol (COREG) 12.5 MG tablet  . clopidogrel (PLAVIX) 75 MG tablet  . COLCRYS 0.6 MG tablet  . Continuous Blood Gluc Sensor (FREESTYLE LIBRE 14 DAY SENSOR) MISC  . DULoxetine (CYMBALTA) 60 MG capsule  . empagliflozin (JARDIANCE) 25 MG TABS tablet  . escitalopram (LEXAPRO) 20 MG tablet  . furosemide (LASIX) 80 MG tablet  . gabapentin (NEURONTIN) 800 MG tablet  . insulin regular human CONCENTRATED (HUMULIN R U-500 KWIKPEN) 500 UNIT/ML kwikpen  . levothyroxine (SYNTHROID, LEVOTHROID) 88 MCG tablet  . losartan (COZAAR) 50 MG tablet  . nitroGLYCERIN (NITROSTAT) 0.4 MG SL tablet  . omeprazole (PRILOSEC) 20 MG capsule  .  OZEMPIC, 1 MG/DOSE, 4 MG/3ML SOPN  . potassium chloride (MICRO-K) 10 MEQ CR capsule  . rosuvastatin (CRESTOR) 20 MG tablet   No current facility-administered medications for this encounter.   . [STARDerrill Memo/2022] vancomycin (VANCOREADY) IVPB 1500 mg/300 mL    AllisonMyra GianottiSurgical Short Stay/Anesthesiology [facility] PhoMiami County [phone] Family [phone] 10:58 AM

## 2020-07-25 NOTE — Progress Notes (Signed)
Left message with Dr. Lacy Duverney scheduler regarding patient's elevated A 1 C and elevated creatinine. Also spoke to A. Ulice Brilliant, PA to confirm she was aware of patient's labs. AUlice Brilliant, PA confirmed she is aware and also confirmed she reached out to Dr. Lacy Duverney office.

## 2020-07-25 NOTE — Patient Outreach (Signed)
[facility]) Care Management  07/25/2020  Osei Anger Benton-Elliot 1955/08/22 [id number]   Referral Received [facility] 6/7 Requested assistance for a member with finding a provider and possible assistance with co-pays  RN spoke with pt today and introduced [facility] services and available disciplines. Pt started his conversation about the family issues and how there was a referral from Dr. Shelia Media in January but the contact number to the referring provider Dr. Vikki Ports was not valid. RN inquired further if pt contacted Dr. Pennie Banter office with this information and pt indicated he did with Dr. Shelia Media recommending another provider with [facility]. Pt states he has called but unsuccessful in reaching anyone. RN verified the contact number to this provider and called the number to make sure the contact was valid. RN requested the pt to call and arrange an appointment. Pt is aware [facility] has CSW to assist with counseling if needed however strongly encouraged pt to contact this agency for an appointment soon. RN will refer to [facility] worker if pt is unsuccessful with getting an appointment over the next week. Pt has verified his situation is not urgent but he needs the help with again a psychiatrist or psychologist.  RN inquired any other needs. Pt indicated he would need a low co-pay for any appointments. Of course pt is aware [facility] is no charge but inform pt that the referring agency would need to assist with that request. Inform pt that [facility] is unable to assist with any co-payments (pt verbalized an understanding). Encouraged pt to inquired with the agency on possible payment plan or other options if appropriate for his needs.   Will activate case short term for a follow up call next week however no other needs to address at this time. Pt very appreciative for the assistance provided and aware to call this RN case manager if there are any other needs.   Raina Mina, RN Care Management  Coordinator [place] Office [phone]

## 2020-07-26 ENCOUNTER — Ambulatory Visit (HOSPITAL_COMMUNITY): Payer: Medicare Other | Admitting: Certified Registered Nurse Anesthetist

## 2020-07-26 ENCOUNTER — Observation Stay (HOSPITAL_COMMUNITY)
Admission: RE | Admit: 2020-07-26 | Discharge: 2020-07-26 | Disposition: A | Discharge status: Home / Self Care | Payer: Medicare Other | Attending: Neurosurgery | Admitting: Neurosurgery

## 2020-07-26 ENCOUNTER — Ambulatory Visit (HOSPITAL_COMMUNITY): Payer: Medicare Other

## 2020-07-26 ENCOUNTER — Other Ambulatory Visit: Payer: Self-pay

## 2020-07-26 ENCOUNTER — Encounter (HOSPITAL_COMMUNITY)
Admission: RE | Disposition: A | Discharge status: Home / Self Care | Payer: Self-pay | Source: Home / Self Care | Attending: Neurosurgery

## 2020-07-26 ENCOUNTER — Ambulatory Visit (HOSPITAL_COMMUNITY): Payer: Medicare Other | Admitting: Vascular Surgery

## 2020-07-26 ENCOUNTER — Encounter (HOSPITAL_COMMUNITY): Payer: Self-pay | Admitting: Neurosurgery

## 2020-07-26 DIAGNOSIS — Z9989 Dependence on other enabling machines and devices: Secondary | ICD-10-CM | POA: Diagnosis not present

## 2020-07-26 DIAGNOSIS — M48062 Spinal stenosis, lumbar region with neurogenic claudication: Principal | ICD-10-CM | POA: Diagnosis present

## 2020-07-26 DIAGNOSIS — Z87891 Personal history of nicotine dependence: Secondary | ICD-10-CM | POA: Diagnosis not present

## 2020-07-26 DIAGNOSIS — Z981 Arthrodesis status: Secondary | ICD-10-CM | POA: Diagnosis not present

## 2020-07-26 DIAGNOSIS — E039 Hypothyroidism, unspecified: Secondary | ICD-10-CM | POA: Diagnosis not present

## 2020-07-26 DIAGNOSIS — Z419 Encounter for procedure for purposes other than remedying health state, unspecified: Secondary | ICD-10-CM

## 2020-07-26 DIAGNOSIS — E119 Type 2 diabetes mellitus without complications: Secondary | ICD-10-CM | POA: Diagnosis not present

## 2020-07-26 DIAGNOSIS — G4733 Obstructive sleep apnea (adult) (pediatric): Secondary | ICD-10-CM | POA: Diagnosis not present

## 2020-07-26 DIAGNOSIS — M4326 Fusion of spine, lumbar region: Secondary | ICD-10-CM | POA: Diagnosis not present

## 2020-07-26 DIAGNOSIS — M47816 Spondylosis without myelopathy or radiculopathy, lumbar region: Secondary | ICD-10-CM | POA: Diagnosis not present

## 2020-07-26 HISTORY — PX: LUMBAR LAMINECTOMY/DECOMPRESSION MICRODISCECTOMY: SHX5026

## 2020-07-26 LAB — CBC
HCT: 42.8 % (ref 39.0–52.0)
Hemoglobin: 15.1 g/dL (ref 13.0–17.0)
MCH: 31.5 pg (ref 26.0–34.0)
MCHC: 35.3 g/dL (ref 30.0–36.0)
MCV: 89.2 fL (ref 80.0–100.0)
Platelets: 163 10*3/uL (ref 150–400)
RBC: 4.8 MIL/uL (ref 4.22–5.81)
RDW: 14.1 % (ref 11.5–15.5)
WBC: 8.8 10*3/uL (ref 4.0–10.5)
nRBC: 0 % (ref 0.0–0.2)

## 2020-07-26 LAB — GLUCOSE, CAPILLARY
Glucose-Capillary: 326 mg/dL — ABNORMAL HIGH (ref 70–99)
Glucose-Capillary: 330 mg/dL — ABNORMAL HIGH (ref 70–99)
Glucose-Capillary: 324 mg/dL — ABNORMAL HIGH (ref 70–99)
Glucose-Capillary: 288 mg/dL — ABNORMAL HIGH (ref 70–99)
Glucose-Capillary: 299 mg/dL — ABNORMAL HIGH (ref 70–99)
Glucose-Capillary: 432 mg/dL — ABNORMAL HIGH (ref 70–99)

## 2020-07-26 LAB — CREATININE, SERUM
Creatinine, Ser: 2.19 mg/dL — ABNORMAL HIGH (ref 0.61–1.24)
GFR, Estimated: 32 mL/min — ABNORMAL LOW (ref >60)

## 2020-07-26 SURGERY — LUMBAR LAMINECTOMY/DECOMPRESSION MICRODISCECTOMY 1 LEVEL
Anesthesia: General | Site: Spine Lumbar

## 2020-07-26 MED ORDER — ACETAMINOPHEN 650 MG RE SUPP: 650.0000 mg | RECTAL | Status: DC | PRN

## 2020-07-26 MED ORDER — PANTOPRAZOLE SODIUM 40 MG PO TBEC: 40.0000 mg | DELAYED_RELEASE_TABLET | Freq: Every day | ORAL | Status: DC

## 2020-07-26 MED ORDER — TIZANIDINE HCL 4 MG PO TABS: 4.0000 mg | ORAL_TABLET | Freq: Four times a day (QID) | ORAL | 0 refills | Status: DC | PRN

## 2020-07-26 MED ORDER — OXYCODONE HCL 5 MG PO TABS
ORAL_TABLET | ORAL | Status: AC
  Administered 2020-07-26: 5 mg via ORAL
  Filled 2020-07-26: qty 1

## 2020-07-26 MED ORDER — SCOPOLAMINE 1 MG/3DAYS TD PT72
MEDICATED_PATCH | TRANSDERMAL | Status: DC | PRN
  Administered 2020-07-26: 1 via TRANSDERMAL

## 2020-07-26 MED ORDER — INSULIN REGULAR HUMAN (CONC) 500 UNIT/ML ~~LOC~~ SOPN
120.0000 [IU] | PEN_INJECTOR | Freq: Two times a day (BID) | SUBCUTANEOUS | Status: DC
  Administered 2020-07-26: 120 [IU] via SUBCUTANEOUS
  Filled 2020-07-26: qty 3

## 2020-07-26 MED ORDER — MIDAZOLAM HCL 2 MG/2ML IJ SOLN
INTRAMUSCULAR | Status: AC
  Filled 2020-07-26: qty 2

## 2020-07-26 MED ORDER — GABAPENTIN 400 MG PO CAPS
1600.0000 mg | ORAL_CAPSULE | Freq: Three times a day (TID) | ORAL | Status: DC
  Administered 2020-07-26: 1600 mg via ORAL
  Filled 2020-07-26: qty 4

## 2020-07-26 MED ORDER — MENTHOL 3 MG MT LOZG: 1.0000 | LOZENGE | OROMUCOSAL | Status: DC | PRN

## 2020-07-26 MED ORDER — HEMOSTATIC AGENTS (NO CHARGE) OPTIME
TOPICAL | Status: DC | PRN
  Administered 2020-07-26: 1 via TOPICAL

## 2020-07-26 MED ORDER — PHENYLEPHRINE 40 MCG/ML (10ML) SYRINGE FOR IV PUSH (FOR BLOOD PRESSURE SUPPORT)
PREFILLED_SYRINGE | INTRAVENOUS | Status: DC | PRN
  Administered 2020-07-26 (×2): 80 ug via INTRAVENOUS

## 2020-07-26 MED ORDER — CHLORHEXIDINE GLUCONATE CLOTH 2 % EX PADS: 6.0000 | MEDICATED_PAD | Freq: Once | CUTANEOUS | Status: DC

## 2020-07-26 MED ORDER — ESCITALOPRAM OXALATE 20 MG PO TABS
20.0000 mg | ORAL_TABLET | Freq: Every day | ORAL | Status: DC
  Administered 2020-07-26: 20 mg via ORAL
  Filled 2020-07-26: qty 1

## 2020-07-26 MED ORDER — SODIUM CHLORIDE 0.9% FLUSH: 3.0000 mL | INTRAVENOUS | Status: DC | PRN

## 2020-07-26 MED ORDER — ONDANSETRON HCL 4 MG/2ML IJ SOLN
INTRAMUSCULAR | Status: DC | PRN
  Administered 2020-07-26: 4 mg via INTRAVENOUS

## 2020-07-26 MED ORDER — ACETAMINOPHEN 325 MG PO TABS
650.0000 mg | ORAL_TABLET | ORAL | Status: DC | PRN
Start: 2020-07-26 — End: 2020-07-27

## 2020-07-26 MED ORDER — ACETAMINOPHEN 10 MG/ML IV SOLN
INTRAVENOUS | Status: AC
  Filled 2020-07-26: qty 100

## 2020-07-26 MED ORDER — ROSUVASTATIN CALCIUM 20 MG PO TABS: 20.0000 mg | ORAL_TABLET | Freq: Every day | ORAL | Status: DC

## 2020-07-26 MED ORDER — KETOROLAC TROMETHAMINE 15 MG/ML IJ SOLN
INTRAMUSCULAR | Status: AC
  Administered 2020-07-26: 7.5 mg via INTRAVENOUS
  Filled 2020-07-26: qty 1

## 2020-07-26 MED ORDER — INSULIN ASPART 100 UNIT/ML IJ SOLN
INTRAMUSCULAR | Status: DC | PRN
  Administered 2020-07-26: 8 [IU] via SUBCUTANEOUS

## 2020-07-26 MED ORDER — SUCCINYLCHOLINE CHLORIDE 200 MG/10ML IV SOSY
PREFILLED_SYRINGE | INTRAVENOUS | Status: AC
  Filled 2020-07-26: qty 10

## 2020-07-26 MED ORDER — LIDOCAINE 2% (20 MG/ML) 5 ML SYRINGE
INTRAMUSCULAR | Status: DC | PRN
  Administered 2020-07-26: 40 mg via INTRAVENOUS

## 2020-07-26 MED ORDER — DIAZEPAM 5 MG PO TABS: 5.0000 mg | ORAL_TABLET | Freq: Four times a day (QID) | ORAL | Status: DC | PRN

## 2020-07-26 MED ORDER — OXYCODONE HCL 5 MG PO TABS
5.0000 mg | ORAL_TABLET | ORAL | Status: DC | PRN
Start: 2020-07-26 — End: 2020-07-27

## 2020-07-26 MED ORDER — LOSARTAN POTASSIUM 50 MG PO TABS
50.0000 mg | ORAL_TABLET | Freq: Every day | ORAL | Status: DC
  Administered 2020-07-26: 50 mg via ORAL
  Filled 2020-07-26: qty 1

## 2020-07-26 MED ORDER — INSULIN ASPART 100 UNIT/ML IJ SOLN
INTRAMUSCULAR | Status: AC
  Filled 2020-07-26: qty 1

## 2020-07-26 MED ORDER — ONDANSETRON HCL 4 MG/2ML IJ SOLN: 4.0000 mg | Freq: Once | INTRAMUSCULAR | Status: DC | PRN

## 2020-07-26 MED ORDER — ONDANSETRON HCL 4 MG PO TABS: 4.0000 mg | ORAL_TABLET | Freq: Four times a day (QID) | ORAL | Status: DC | PRN

## 2020-07-26 MED ORDER — MIDAZOLAM HCL 2 MG/2ML IJ SOLN
INTRAMUSCULAR | Status: DC | PRN
  Administered 2020-07-26: 2 mg via INTRAVENOUS

## 2020-07-26 MED ORDER — LIDOCAINE-EPINEPHRINE 0.5 %-1:200000 IJ SOLN
INTRAMUSCULAR | Status: DC | PRN
  Administered 2020-07-26: 10 mL

## 2020-07-26 MED ORDER — ORAL CARE MOUTH RINSE: 15.0000 mL | Freq: Once | OROMUCOSAL | Status: AC

## 2020-07-26 MED ORDER — THROMBIN 5000 UNITS EX SOLR
CUTANEOUS | Status: AC
  Filled 2020-07-26: qty 5000

## 2020-07-26 MED ORDER — PHENOL 1.4 % MT LIQD: 1.0000 | OROMUCOSAL | Status: DC | PRN

## 2020-07-26 MED ORDER — SODIUM CHLORIDE 0.9% FLUSH
3.0000 mL | Freq: Two times a day (BID) | INTRAVENOUS | Status: DC
  Administered 2020-07-26: 3 mL via INTRAVENOUS

## 2020-07-26 MED ORDER — DULOXETINE HCL 30 MG PO CPEP: 60.0000 mg | ORAL_CAPSULE | Freq: Every day | ORAL | Status: DC

## 2020-07-26 MED ORDER — VANCOMYCIN HCL IN DEXTROSE 1-5 GM/200ML-% IV SOLN
INTRAVENOUS | Status: AC
  Filled 2020-07-26: qty 200

## 2020-07-26 MED ORDER — LIDOCAINE 2% (20 MG/ML) 5 ML SYRINGE
INTRAMUSCULAR | Status: AC
  Filled 2020-07-26: qty 5

## 2020-07-26 MED ORDER — HYDROMORPHONE HCL 1 MG/ML IJ SOLN
0.2500 mg | INTRAMUSCULAR | Status: DC | PRN
  Administered 2020-07-26 (×2): 0.5 mg via INTRAVENOUS

## 2020-07-26 MED ORDER — THROMBIN 5000 UNITS EX SOLR
CUTANEOUS | Status: DC | PRN
  Administered 2020-07-26 (×2): 5000 [IU] via TOPICAL

## 2020-07-26 MED ORDER — EMPAGLIFLOZIN 25 MG PO TABS
25.0000 mg | ORAL_TABLET | Freq: Every day | ORAL | Status: DC
  Administered 2020-07-26: 25 mg via ORAL
  Filled 2020-07-26: qty 1

## 2020-07-26 MED ORDER — POTASSIUM CHLORIDE CRYS ER 20 MEQ PO TBCR
10.0000 meq | EXTENDED_RELEASE_TABLET | Freq: Every day | ORAL | Status: DC
  Administered 2020-07-26: 10 meq via ORAL
  Filled 2020-07-26: qty 1

## 2020-07-26 MED ORDER — OXYCODONE HCL 5 MG PO TABS: 10.0000 mg | ORAL_TABLET | ORAL | Status: DC | PRN

## 2020-07-26 MED ORDER — PROPOFOL 10 MG/ML IV BOLUS
INTRAVENOUS | Status: DC | PRN
  Administered 2020-07-26: 200 mg via INTRAVENOUS

## 2020-07-26 MED ORDER — ROCURONIUM BROMIDE 10 MG/ML (PF) SYRINGE
PREFILLED_SYRINGE | INTRAVENOUS | Status: DC | PRN
  Administered 2020-07-26 (×2): 20 mg via INTRAVENOUS
  Administered 2020-07-26: 40 mg via INTRAVENOUS

## 2020-07-26 MED ORDER — HEPARIN SODIUM (PORCINE) 5000 UNIT/ML IJ SOLN
5000.0000 [IU] | Freq: Three times a day (TID) | INTRAMUSCULAR | Status: DC
  Administered 2020-07-26: 5000 [IU] via SUBCUTANEOUS
  Filled 2020-07-26: qty 1

## 2020-07-26 MED ORDER — PHENYLEPHRINE 40 MCG/ML (10ML) SYRINGE FOR IV PUSH (FOR BLOOD PRESSURE SUPPORT)
PREFILLED_SYRINGE | INTRAVENOUS | Status: AC
  Filled 2020-07-26: qty 10

## 2020-07-26 MED ORDER — SODIUM CHLORIDE 0.9 % IV SOLN
250.0000 mL | INTRAVENOUS | Status: DC
  Administered 2020-07-26: 250 mL via INTRAVENOUS

## 2020-07-26 MED ORDER — SUCCINYLCHOLINE CHLORIDE 200 MG/10ML IV SOSY
PREFILLED_SYRINGE | INTRAVENOUS | Status: DC | PRN
  Administered 2020-07-26: 160 mg via INTRAVENOUS

## 2020-07-26 MED ORDER — 0.9 % SODIUM CHLORIDE (POUR BTL) OPTIME
TOPICAL | Status: DC | PRN
  Administered 2020-07-26: 1000 mL

## 2020-07-26 MED ORDER — KETOROLAC TROMETHAMINE 15 MG/ML IJ SOLN
7.5000 mg | Freq: Four times a day (QID) | INTRAMUSCULAR | Status: DC
  Administered 2020-07-26: 7.5 mg via INTRAVENOUS
  Filled 2020-07-26: qty 1

## 2020-07-26 MED ORDER — CHLORHEXIDINE GLUCONATE 0.12 % MT SOLN
15.0000 mL | Freq: Once | OROMUCOSAL | Status: AC
  Administered 2020-07-26: 15 mL via OROMUCOSAL
  Filled 2020-07-26: qty 15

## 2020-07-26 MED ORDER — OXYCODONE HCL ER 15 MG PO T12A
15.0000 mg | EXTENDED_RELEASE_TABLET | Freq: Two times a day (BID) | ORAL | Status: DC
Start: 2020-07-26 — End: 2020-07-27
  Administered 2020-07-26: 15 mg via ORAL
  Filled 2020-07-26: qty 1

## 2020-07-26 MED ORDER — NITROGLYCERIN 0.4 MG SL SUBL: 0.4000 mg | SUBLINGUAL_TABLET | SUBLINGUAL | Status: DC | PRN

## 2020-07-26 MED ORDER — BUPIVACAINE HCL (PF) 0.25 % IJ SOLN
INTRAMUSCULAR | Status: AC
  Filled 2020-07-26: qty 30

## 2020-07-26 MED ORDER — DEXAMETHASONE SODIUM PHOSPHATE 10 MG/ML IJ SOLN
INTRAMUSCULAR | Status: AC
  Filled 2020-07-26: qty 1

## 2020-07-26 MED ORDER — LEVOTHYROXINE SODIUM 88 MCG PO TABS
88.0000 ug | ORAL_TABLET | Freq: Every day | ORAL | Status: DC
  Filled 2020-07-26: qty 1

## 2020-07-26 MED ORDER — ROCURONIUM BROMIDE 10 MG/ML (PF) SYRINGE
PREFILLED_SYRINGE | INTRAVENOUS | Status: AC
  Filled 2020-07-26: qty 10

## 2020-07-26 MED ORDER — FENTANYL CITRATE (PF) 250 MCG/5ML IJ SOLN
INTRAMUSCULAR | Status: AC
  Filled 2020-07-26: qty 5

## 2020-07-26 MED ORDER — SUGAMMADEX SODIUM 500 MG/5ML IV SOLN
INTRAVENOUS | Status: AC
  Filled 2020-07-26: qty 5

## 2020-07-26 MED ORDER — ONDANSETRON HCL 4 MG/2ML IJ SOLN: 4.0000 mg | Freq: Four times a day (QID) | INTRAMUSCULAR | Status: DC | PRN

## 2020-07-26 MED ORDER — LACTATED RINGERS IV SOLN: INTRAVENOUS | Status: DC

## 2020-07-26 MED ORDER — EPHEDRINE 5 MG/ML INJ
INTRAVENOUS | Status: AC
  Filled 2020-07-26: qty 10

## 2020-07-26 MED ORDER — SUGAMMADEX SODIUM 200 MG/2ML IV SOLN
INTRAVENOUS | Status: DC | PRN
  Administered 2020-07-26: 350 mg via INTRAVENOUS

## 2020-07-26 MED ORDER — FENTANYL CITRATE (PF) 100 MCG/2ML IJ SOLN
INTRAMUSCULAR | Status: DC | PRN
  Administered 2020-07-26 (×5): 50 ug via INTRAVENOUS

## 2020-07-26 MED ORDER — HYDROMORPHONE HCL 1 MG/ML IJ SOLN
INTRAMUSCULAR | Status: AC
  Filled 2020-07-26: qty 1

## 2020-07-26 MED ORDER — OXYCODONE HCL 5 MG PO TABS: 5.0000 mg | ORAL_TABLET | Freq: Four times a day (QID) | ORAL | 0 refills | Status: AC | PRN

## 2020-07-26 MED ORDER — ONDANSETRON HCL 4 MG/2ML IJ SOLN
INTRAMUSCULAR | Status: AC
  Filled 2020-07-26: qty 2

## 2020-07-26 MED ORDER — INSULIN ASPART 100 UNIT/ML IJ SOLN
10.0000 [IU] | Freq: Once | INTRAMUSCULAR | Status: AC
  Administered 2020-07-26: 10 [IU] via SUBCUTANEOUS

## 2020-07-26 MED ORDER — CARVEDILOL 12.5 MG PO TABS
12.5000 mg | ORAL_TABLET | Freq: Two times a day (BID) | ORAL | Status: DC
  Administered 2020-07-26: 12.5 mg via ORAL
  Filled 2020-07-26: qty 1

## 2020-07-26 MED ORDER — COLCHICINE 0.6 MG PO TABS: 0.6000 mg | ORAL_TABLET | Freq: Every day | ORAL | Status: DC

## 2020-07-26 MED ORDER — BUPIVACAINE HCL (PF) 0.25 % IJ SOLN
INTRAMUSCULAR | Status: DC | PRN
  Administered 2020-07-26: 30 mL

## 2020-07-26 MED ORDER — HYDROMORPHONE HCL 1 MG/ML IJ SOLN: 1.0000 mg | INTRAMUSCULAR | Status: DC | PRN

## 2020-07-26 MED ORDER — FUROSEMIDE 40 MG PO TABS: 160.0000 mg | ORAL_TABLET | Freq: Three times a day (TID) | ORAL | Status: DC

## 2020-07-26 MED ORDER — INSULIN ASPART 100 UNIT/ML IJ SOLN
0.0000 [IU] | Freq: Three times a day (TID) | INTRAMUSCULAR | Status: DC
  Administered 2020-07-26: 20 [IU] via SUBCUTANEOUS

## 2020-07-26 MED ORDER — ACETAMINOPHEN 10 MG/ML IV SOLN
1000.0000 mg | Freq: Once | INTRAVENOUS | Status: DC | PRN
  Administered 2020-07-26: 1000 mg via INTRAVENOUS

## 2020-07-26 MED ORDER — POTASSIUM CHLORIDE IN NACL 20-0.9 MEQ/L-% IV SOLN: INTRAVENOUS | Status: DC

## 2020-07-26 MED ORDER — ALLOPURINOL 100 MG PO TABS
100.0000 mg | ORAL_TABLET | Freq: Two times a day (BID) | ORAL | Status: DC
  Filled 2020-07-26: qty 1

## 2020-07-26 MED ORDER — EPHEDRINE SULFATE-NACL 50-0.9 MG/10ML-% IV SOSY
PREFILLED_SYRINGE | INTRAVENOUS | Status: DC | PRN
  Administered 2020-07-26 (×2): 10 mg via INTRAVENOUS

## 2020-07-26 MED ORDER — SCOPOLAMINE 1 MG/3DAYS TD PT72
MEDICATED_PATCH | TRANSDERMAL | Status: AC
  Filled 2020-07-26: qty 1

## 2020-07-26 SURGICAL SUPPLY — 49 items
BAND RUBBER #18 3X1/16 STRL (MISCELLANEOUS) ×6 IMPLANT
BENZOIN TINCTURE PRP APPL 2/3 (GAUZE/BANDAGES/DRESSINGS) IMPLANT
BLADE CLIPPER SURG (BLADE) IMPLANT
BUR MATCHSTICK NEURO 3.0 LAGG (BURR) ×3 IMPLANT
BUR PRECISION FLUTE 5.0 (BURR) IMPLANT
CANISTER SUCT 3000ML PPV (MISCELLANEOUS) ×3 IMPLANT
CARTRIDGE OIL MAESTRO DRILL (MISCELLANEOUS) ×1 IMPLANT
CLOSURE WOUND 1/2 X4 (GAUZE/BANDAGES/DRESSINGS)
COVER WAND RF STERILE (DRAPES) IMPLANT
DECANTER SPIKE VIAL GLASS SM (MISCELLANEOUS) IMPLANT
DERMABOND ADVANCED (GAUZE/BANDAGES/DRESSINGS) ×2
DERMABOND ADVANCED .7 DNX12 (GAUZE/BANDAGES/DRESSINGS) ×1 IMPLANT
DIFFUSER DRILL AIR PNEUMATIC (MISCELLANEOUS) ×3 IMPLANT
DRAPE LAPAROTOMY 100X72X124 (DRAPES) ×3 IMPLANT
DRAPE MICROSCOPE LEICA (MISCELLANEOUS) IMPLANT
DRAPE SURG 17X23 STRL (DRAPES) ×3 IMPLANT
DURAPREP 26ML APPLICATOR (WOUND CARE) ×3 IMPLANT
ELECT REM PT RETURN 9FT ADLT (ELECTROSURGICAL) ×3
ELECTRODE REM PT RTRN 9FT ADLT (ELECTROSURGICAL) ×1 IMPLANT
GAUZE 4X4 16PLY RFD (DISPOSABLE) IMPLANT
GAUZE SPONGE 4X4 12PLY STRL (GAUZE/BANDAGES/DRESSINGS) IMPLANT
GLOVE ECLIPSE 6.5 STRL STRAW (GLOVE) ×3 IMPLANT
GLOVE EXAM NITRILE XL STR (GLOVE) IMPLANT
GLOVE SRG 8 PF TXTR STRL LF DI (GLOVE) ×2 IMPLANT
GLOVE SURG LTX SZ7.5 (GLOVE) ×9 IMPLANT
GLOVE SURG UNDER POLY LF SZ8 (GLOVE) ×6
GOWN STRL REUS W/ TWL LRG LVL3 (GOWN DISPOSABLE) ×2 IMPLANT
GOWN STRL REUS W/ TWL XL LVL3 (GOWN DISPOSABLE) IMPLANT
GOWN STRL REUS W/TWL 2XL LVL3 (GOWN DISPOSABLE) IMPLANT
GOWN STRL REUS W/TWL LRG LVL3 (GOWN DISPOSABLE) ×6
GOWN STRL REUS W/TWL XL LVL3 (GOWN DISPOSABLE)
KIT BASIN OR (CUSTOM PROCEDURE TRAY) ×3 IMPLANT
KIT TURNOVER KIT B (KITS) ×3 IMPLANT
NEEDLE HYPO 25X1 1.5 SAFETY (NEEDLE) ×3 IMPLANT
NEEDLE SPNL 18GX3.5 QUINCKE PK (NEEDLE) IMPLANT
NS IRRIG 1000ML POUR BTL (IV SOLUTION) ×3 IMPLANT
OIL CARTRIDGE MAESTRO DRILL (MISCELLANEOUS) ×3
PACK LAMINECTOMY NEURO (CUSTOM PROCEDURE TRAY) ×3 IMPLANT
PAD ARMBOARD 7.5X6 YLW CONV (MISCELLANEOUS) ×9 IMPLANT
SPONGE LAP 4X18 RFD (DISPOSABLE) IMPLANT
SPONGE SURGIFOAM ABS GEL SZ50 (HEMOSTASIS) ×3 IMPLANT
STRIP CLOSURE SKIN 1/2X4 (GAUZE/BANDAGES/DRESSINGS) IMPLANT
SUT VIC AB 0 CT1 18XCR BRD8 (SUTURE) ×1 IMPLANT
SUT VIC AB 0 CT1 8-18 (SUTURE) ×3
SUT VIC AB 2-0 CT1 18 (SUTURE) ×3 IMPLANT
SUT VIC AB 3-0 SH 8-18 (SUTURE) ×3 IMPLANT
TOWEL GREEN STERILE (TOWEL DISPOSABLE) ×3 IMPLANT
TOWEL GREEN STERILE FF (TOWEL DISPOSABLE) ×3 IMPLANT
WATER STERILE IRR 1000ML POUR (IV SOLUTION) ×3 IMPLANT

## 2020-07-26 NOTE — Progress Notes (Signed)
Patient's CBG at 0647= 326. Dr. Kalman Shan notified and gave verbal order for 10 units Novolog  Insulin. Novolog given at 0745. Rechecked blood sugar at 0847= 324. Dr. Kalman Shan notified. No further orders received.

## 2020-07-26 NOTE — Anesthesia Preprocedure Evaluation (Signed)
Anesthesia Evaluation  Patient identified by MRN, date of birth, ID band Patient awake    Reviewed: Allergy & Precautions, NPO status , Patient's Chart, lab work & pertinent test results  Airway Mallampati: III  TM Distance: <3 FB Neck ROM: Full    Dental no notable dental hx.    Pulmonary sleep apnea , COPD, former smoker,    Pulmonary exam normal breath sounds clear to auscultation       Cardiovascular hypertension, + CAD and + Cardiac Stents  Normal cardiovascular exam Rhythm:Regular Rate:Normal     Neuro/Psych negative neurological ROS  negative psych ROS   GI/Hepatic negative GI ROS, Neg liver ROS,   Endo/Other  diabetesHypothyroidism Morbid obesity  Renal/GU negative Renal ROS  negative genitourinary   Musculoskeletal negative musculoskeletal ROS (+)   Abdominal   Peds negative pediatric ROS (+)  Hematology negative hematology ROS (+)   Anesthesia Other Findings   Reproductive/Obstetrics negative OB ROS                             Anesthesia Physical Anesthesia Plan  ASA: III  Anesthesia Plan: General   Post-op Pain Management:    Induction: Intravenous  PONV Risk Score and Plan: 2 and Ondansetron, Dexamethasone and Treatment may vary due to age or medical condition  Airway Management Planned: Oral ETT  Additional Equipment:   Intra-op Plan:   Post-operative Plan: Extubation in OR  Informed Consent: I have reviewed the patients History and Physical, chart, labs and discussed the procedure including the risks, benefits and alternatives for the proposed anesthesia with the patient or authorized representative who has indicated his/her understanding and acceptance.     Dental advisory given  Plan Discussed with: CRNA and Surgeon  Anesthesia Plan Comments:         Anesthesia Quick Evaluation

## 2020-07-26 NOTE — Discharge Summary (Signed)
Physician Discharge Summary  Patient ID: IMRAAN WENDELL MRN: [id number] DOB/AGE: 67-May-1957 67 y.o.  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 male Admitted today for a lumbar decompression at L2/3. His operation went well, without complications. His wound is clean, dry, without signs of infection. He is ambulating, voiding, and tolerating a regular diet.   Treatments: surgery: L2/3 laminectomy for spinal decompression  Discharge Exam: Blood pressure (!) 148/74, pulse 68, temperature 97.6 F (36.4 C), resp. rate 17, height _0  (1.854 m), weight (!) 150.4 kg, SpO2 98 %. General appearance: alert, cooperative and no distress  Disposition: Discharge disposition: 01-Home or Self Care      Lumbar stenosis with neurogenic claudication  Allergies as of 07/26/2020      Reactions   Septra [bactrim] Itching   Penicillins Hives   Allergy to All cillin drugs  Ancef given 9/24 with no obvious reaction   Metformin And Related Diarrhea, Nausea Only   Other Nausea And Vomiting   General Anesthesia - sometimes causes nausea and vomiting       Medication List    TAKE these medications   allopurinol 100 MG tablet Commonly known as: ZYLOPRIM Take 100 mg by mouth 2 (two) times daily.   B-D ULTRAFINE III SHORT PEN 31G X 8 MM Misc Generic drug: Insulin Pen Needle Inject 1 each into the skin 3 (three) times daily.   carvedilol 12.5 MG tablet Commonly known as: COREG TAKE 1 TABLET (12.5 MG TOTAL) BY MOUTH 2 (TWO) TIMES DAILY WITH A MEAL.   clopidogrel 75 MG tablet Commonly known as: PLAVIX TAKE 1 TABLET BY MOUTH EVERY DAY   Colcrys 0.6 MG tablet Generic drug: colchicine Take 0.6 mg by mouth daily.   DULoxetine  60 MG capsule Commonly known as: CYMBALTA Take 60 mg by mouth daily.   escitalopram 20 MG tablet Commonly known as: LEXAPRO Take 20 mg by mouth daily.   FreeStyle Libre 14 Day Sensor Misc APPLY 1 SENSOR EVERY 14 DAYS   furosemide 80 MG tablet Commonly known as: LASIX Take 160 mg by mouth 3 (three) times daily.   gabapentin 800 MG tablet Commonly known as: NEURONTIN Take 1,600 mg by mouth 3 (three) times daily.   HumuLIN R U-500 KwikPen 500 UNIT/ML kwikpen Generic drug: insulin regular human CONCENTRATED Inject 120 Units into the skin 2 (two) times daily.   Jardiance 25 MG Tabs tablet Generic drug: empagliflozin Take 25 mg by mouth daily.   levothyroxine 88 MCG tablet Commonly known as: SYNTHROID Take 88 mcg by mouth daily before breakfast.   losartan 50 MG tablet Commonly known as: COZAAR Take 50 mg by mouth daily.   nitroGLYCERIN 0.4 MG SL tablet Commonly known as: NITROSTAT PLACE 1 TAB UNDER THE TONGUE EVERY 5 MINUTES AS NEEDED FOR CHEST PAIN (SEVERE PRESSURE OR TIGHTNESS) What changed: See the new instructions.   omeprazole 20 MG capsule Commonly known as: PRILOSEC Take 1 capsule (20 mg total) by mouth daily.   oxyCODONE 5 MG immediate release tablet Commonly known as: Oxy IR/ROXICODONE Take 1 tablet (5 mg total) by mouth every 6 (six) hours as needed for up to 8 days for moderate pain ((score 4 to 6)).   Ozempic (1 MG/DOSE) 4 MG/3ML Sopn Generic drug: Semaglutide (1 MG/DOSE) Inject 2  mg as directed every Sunday.   potassium chloride 10 MEQ CR capsule Commonly known as: MICRO-K Take 10 mEq by mouth daily.   rosuvastatin 20 MG tablet Commonly known as: CRESTOR Take 20 mg by mouth daily.   tiZANidine 4 MG tablet Commonly known as: ZANAFLEX Take 1 tablet (4 mg total) by mouth every 6 (six) hours as needed for muscle spasms.       Follow-up Information    Ashok Pall, MD Follow up.   Specialty: Neurosurgery Contact information: [address] [place] [address] [phone]               Signed: Ashok Pall 07/26/2020, 6:55 PM

## 2020-07-26 NOTE — Anesthesia Postprocedure Evaluation (Signed)
Anesthesia Post Note  Patient: Harold [name]  Procedure(s) Performed: Lumbar two-three Laminectomy/Foraminotomy (N/A Spine Lumbar)     Patient location during evaluation: PACU Anesthesia Type: General Level of consciousness: awake and alert Pain management: pain level controlled Vital Signs Assessment: post-procedure vital signs reviewed and stable Respiratory status: spontaneous breathing, nonlabored ventilation, respiratory function stable and patient connected to nasal cannula oxygen Cardiovascular status: blood pressure returned to baseline and stable Postop Assessment: no apparent nausea or vomiting Anesthetic complications: no   No complications documented.  Last Vitals:  Vitals:   07/26/20 1145 07/26/20 1200  BP: (!) 147/67 (!) 142/69  Pulse: 72 70  Resp: 14 13  Temp:    SpO2: 96% 96%    Last Pain:  Vitals:   07/26/20 1215  TempSrc:   PainSc: 5                  Rontae Inglett S

## 2020-07-26 NOTE — Anesthesia Procedure Notes (Signed)
Procedure Name: Intubation Date/Time: 07/26/2020 9:18 AM Performed by: Genelle Bal, CRNA Pre-anesthesia Checklist: Patient identified, Emergency Drugs available, Suction available and Patient being monitored Patient Re-evaluated:Patient Re-evaluated prior to induction Oxygen Delivery Method: Circle system utilized Preoxygenation: Pre-oxygenation with 100% oxygen Induction Type: IV induction Ventilation: Mask ventilation without difficulty and Oral airway inserted - appropriate to patient size Laryngoscope Size: Sabra Heck and 2 Grade View: Grade II Tube type: Oral Tube size: 7.5 mm Number of attempts: 1 Airway Equipment and Method: Stylet and Oral airway Placement Confirmation: ETT inserted through vocal cords under direct vision,  positive ETCO2 and breath sounds checked- equal and bilateral Secured at: 23 cm Tube secured with: Tape Dental Injury: Teeth and Oropharynx as per pre-operative assessment

## 2020-07-26 NOTE — Transfer of Care (Signed)
Immediate Anesthesia Transfer of Care Note  Patient: Harold [name]  Procedure(s) Performed: Lumbar two-three Laminectomy/Foraminotomy (N/A Spine Lumbar)  Patient Location: PACU  Anesthesia Type:General  Level of Consciousness: awake, alert  and oriented  Airway & Oxygen Therapy: Patient Spontanous Breathing and Patient connected to face mask oxygen  Post-op Assessment: Report given to RN and Post -op Vital signs reviewed and stable  Post vital signs: Reviewed and stable  Last Vitals:  Vitals Value Taken Time  BP 166/95 07/26/20 1129  Temp    Pulse 71 07/26/20 1132  Resp 12 07/26/20 1132  SpO2 98 % 07/26/20 1132  Vitals shown include unvalidated device data.  Last Pain:  Vitals:   07/26/20 0655  TempSrc:   PainSc: 0-No pain      Patients Stated Pain Goal: 2 (20/03/79 4446)  Complications: No complications documented.

## 2020-07-26 NOTE — Evaluation (Signed)
Physical Therapy Evaluation Patient Details Name: Harold [name] MRN: [id number] DOB: 1955/06/03 Today's Date: 07/26/2020   History of Present Illness  Pt is a 65 y/o male s/p L2-3 laminectomy. PMH includes DM.  Clinical Impression  Pt admitted secondary to problem above with deficits below. Pt with RLE buckling during mobility and requiring min to min guard A for stability. Increased stability noted with use of RW, but pt reports RW will not fit in his house. Reviewed back precautions and generalized walking program with pt. Would benefit from outpatient therapies once cleared by MD. Will continue to follow acutely.     Follow Up Recommendations Supervision for mobility/OOB (would benefit from outpatient PT once cleared by MD)    Equipment Recommendations  Other (comment) (pt refusing RW)    Recommendations for Other Services       Precautions / Restrictions Precautions Precautions: Back Precaution Booklet Issued: Yes (comment) Precaution Comments: Reviewed back precautions with pt. Restrictions Weight Bearing Restrictions: No      Mobility  Bed Mobility Overal bed mobility: Needs Assistance Bed Mobility: Sit to Sidelying;Rolling;Supine to Sit Rolling: Supervision   Supine to sit: Supervision   Sit to sidelying: Supervision General bed mobility comments: Supervision for safety. Educated about use of log roll technique to maintain back precautions.    Transfers Overall transfer level: Needs assistance Equipment used: None Transfers: Sit to/from Stand Sit to Stand: Min assist;Min guard         General transfer comment: Min A for steadying initially to stand. Min guard A for safety for subsequent stands.  Ambulation/Gait Ambulation/Gait assistance: Min guard;Min assist Gait Distance (Feet): 200 Feet Assistive device: IV Pole;Rolling walker (2 wheeled) Gait Pattern/deviations: Step-through pattern;Decreased stride length Gait velocity: Decreased   General  Gait Details: Pt with buckling noted in RLE during gait and pt with increased instability using IV pole. Min to min guard A For stability. Had pt use RW and pt with improved stability, but reports he won't use at home because it won't fit in his house. Reports he prefers to use cane instead.  Stairs            Wheelchair Mobility    Modified Rankin (Stroke Patients Only)       Balance Overall balance assessment: Needs assistance Sitting-balance support: No upper extremity supported;Feet supported Sitting balance-Leahy Scale: Good     Standing balance support: Single extremity supported;Bilateral upper extremity supported;During functional activity Standing balance-Leahy Scale: Poor Standing balance comment: Reliant on UE and external support                             Pertinent Vitals/Pain Pain Assessment: Faces Faces Pain Scale: Hurts little more Pain Location: back Pain Descriptors / Indicators: Aching;Operative site guarding Pain Intervention(s): Limited activity within patient's tolerance;Monitored during session;Repositioned    Home Living Family/patient expects to be discharged to:: Private residence Living Arrangements: Spouse/significant other Available Help at Discharge: Family;Available 24 hours/day Type of Home: House Home Access: Stairs to enter Entrance Stairs-Rails: None Entrance Stairs-Number of Steps: 1 (porch step) Home Layout: One level Home Equipment: Cane - single point Additional Comments: Pt reports husband works from home and can assist as needed.    Prior Function Level of Independence: Independent         Comments: Furniture walks     Hand Dominance        Extremity/Trunk Assessment   Upper Extremity Assessment Upper Extremity Assessment: Defer to  OT evaluation    Lower Extremity Assessment Lower Extremity Assessment: Generalized weakness (noted buckling in RLE during mobility)    Cervical / Trunk  Assessment Cervical / Trunk Assessment: Other exceptions Cervical / Trunk Exceptions: s/p lumbar surgery  Communication   Communication: No difficulties  Cognition Arousal/Alertness: Awake/alert Behavior During Therapy: WFL for tasks assessed/performed Overall Cognitive Status: Within Functional Limits for tasks assessed                                        General Comments General comments (skin integrity, edema, etc.): Educated about generalized walking program    Exercises     Assessment/Plan    PT Assessment Patient needs continued PT services  PT Problem List Decreased strength;Decreased balance;Decreased activity tolerance;Decreased mobility;Decreased knowledge of use of DME;Decreased knowledge of precautions;Decreased safety awareness;Pain       PT Treatment Interventions DME instruction;Gait training;Stair training;Therapeutic activities;Therapeutic exercise;Balance training;Functional mobility training;Patient/family education    PT Goals (Current goals can be found in the Care Plan section)  Acute Rehab PT Goals Patient Stated Goal: to go home today PT Goal Formulation: With patient Time For Goal Achievement: 08/09/20 Potential to Achieve Goals: Good    Frequency Min 5X/week   Barriers to discharge        Co-evaluation               AM-PAC PT "6 Clicks" Mobility  Outcome Measure Help needed turning from your back to your side while in a flat bed without using bedrails?: A Little Help needed moving from lying on your back to sitting on the side of a flat bed without using bedrails?: A Little Help needed moving to and from a bed to a chair (including a wheelchair)?: A Little Help needed standing up from a chair using your arms (e.g., wheelchair or bedside chair)?: A Little Help needed to walk in hospital room?: A Little Help needed climbing 3-5 steps with a railing? : A Little 6 Click Score: 18    End of Session Equipment Utilized  During Treatment: Gait belt Activity Tolerance: Patient tolerated treatment well Patient left: in bed;with call bell/phone within reach Nurse Communication: Mobility status PT Visit Diagnosis: Unsteadiness on feet (R26.81);Muscle weakness (generalized) (M62.81)    Time: 8102-5486 PT Time Calculation (min) (ACUTE ONLY): 19 min   Charges:   PT Evaluation $PT Eval Low Complexity: 1 Low          Lou Miner, DPT  Acute Rehabilitation Services  Pager: [phone] Office: [phone]   Rudean Hitt 07/26/2020, 4:22 PM

## 2020-07-26 NOTE — Discharge Instructions (Signed)
Lumbar Laminectomy Care After A laminectomy is an operation performed on the spine. The purpose is to decompress the spinal cord and/or the nerve roots.  The time in surgery depends on the findings in surgery and what is necessary to correct the problems. HOME CARE INSTRUCTIONS   Check the cut (incision) made by the surgeon twice a day for signs of infection. Some signs of infection may include:   A foul smelling, greenish or yellowish discharge from the wound.   Increased pain.   Increased redness over the incision (operative) site.   The skin edges may separate.   Flu-like symptoms (problems).   A temperature above 101.5 F (38.6 C).   Change your bandages in about 24 to 36 hours following surgery or as directed.   You may shower tomorrow. Avoid bathtubs, swimming pools and hot tubs for three weeks or until your incision has healed completely. If you have stitches or staples, they may be removed 2 to 3 weeks after surgery, or as directed by your doctor. This may be done by your doctor or caregiver.   You may walk as much as you like. No need to exercise at this time. Limit lifting to ~10lbs.  Weight reduction may be beneficial if you are overweight.   Daily exercise is helpful to prevent the return of problems. Walking is permitted. You may use a treadmill without an incline. Cut down on activities and exercise if you have discomfort. You may also go up and down stairs as much as you can tolerate.   DO NOT lift anything heavier than 10 . Avoid bending or twisting at the waist. Always bend your knees when lifting.   Maintain strength and range of motion as instructed.   Do not drive for 2 to 3 weeks, or as directed by your doctors. You may be a passenger for 20 to 30 minute trips. Lying back in the passenger seat may be more comfortable for you. Always wear a seatbelt.   Limit your sitting in a regular chair to 20 to 30 minutes at a time. There are no limitations for sitting in a  recliner. You should lie down or walk in between sitting periods.   Only take over-the-counter or prescription medicines for pain, discomfort, or fever as directed by your caregiver.  SEEK MEDICAL CARE IF:   There is increased bleeding (more than a small spot) from the wound.   You notice redness, swelling, or increasing pain in the wound.   Pus is coming from wound.   You develop an unexplained oral temperature above 102 F (38.9 C) develops.   You notice a foul smell coming from the wound or dressing.   You have increasing pain in your wound.  SEEK IMMEDIATE MEDICAL CARE IF:   You develop a rash.   You have difficulty breathing.   You develop any allergic problems to medicines given.

## 2020-07-26 NOTE — Op Note (Signed)
07/26/2020  11:35 AM  PATIENT:  Harold [name]  65 y.o. male  PRE-OPERATIVE DIAGNOSIS:  Lumbar stenosis with neurogenic claudication L2/3  POST-OPERATIVE DIAGNOSIS:  Lumbar stenosis with neurogenic claudication L2/3  PROCEDURE:  Procedure(s): Lumbar two-three Laminectomy/Foraminotomy  SURGEON: Surgeon(s): Ashok Pall, MD Judith Part, MD  ASSISTANTS:Ostergard, thomas  ANESTHESIA:   local and general  EBL:  Total I/O In: 1200 [I.V.:1200] Out: 50 [Blood:50]  BLOOD ADMINISTERED:none  CELL SAVER GIVEN:none  COUNT:per nursing  DRAINS: none   SPECIMEN:  No Specimen  DICTATION: Breckin Savannah [name] was taken to the operating room, intubated, and placed under a general anesthetic without difficulty. He was positioned prone on a Jackson table with all pressure points padded. His lumbar region was prepped and draped in a sterile manner. I opened the skin with a 10 blade and dissected sharply to the thoracolumbar fascia. I exposed the lamina of L2 and L3 in the subperiosteal plane bilaterally using the monopolar cautery. I confirmed my location with an intraoperative xray.  I decompressed the spinal canal at L2/3 using the drill, Kerrison punches, and dissectors. The ligamentum flavum was hypertrophied and compressing the thecal sac. I removed the ligament with punches.  Dr. Zada Finders and I decompressed the nerve roots L2,3 bilaterally, along with the canal.  I irrigated the wound. We closed using vicryl sutures. We approximated the thoracolumbar fascia, subcutaneous, and subcuticular planes. I used dermabond for a sterile dressing.  PLAN OF CARE: Admit for overnight observation  PATIENT DISPOSITION:  PACU - hemodynamically stable.   Delay start of Pharmacological VTE agent (>24hrs) due to surgical blood loss or risk of bleeding:  no

## 2020-07-26 NOTE — Plan of Care (Signed)
Adequately Ready for Discharge

## 2020-07-26 NOTE — H&P (Signed)
BP (!) 159/70   Pulse 72   Temp 97.6 F (36.4 C) (Oral)   Resp 17   Ht _0  (1.854 m)   Wt (!) 150.4 kg   SpO2 96%   BMI 43.75 kg/m   Harold [name] is somebody whom I know very well having done 4 lumbar procedures on him.  He is lined up with hardware at L5-S1 and hardware at L4-5.  Harold [name] presents today for evaluation of pain he is having mainly in the right lower extremity, but it does happen in both.  It sounds just like previous episodes secondary to epidural lipomatosis.  When he stands or walks, the pain is worse.  He has no weakness or bowel or bladder dysfunction.  SOCIAL HISTORY :  He is 89 at this time, retired, left-handed, a former smoker.  No history of substance abuse.  He does not use alcohol.     PAST MEDICAL HISTORY :  Includes diabetes and obesity.  He states that he has low back pain, both lower extremities, started in January of 2021.  He woke up, there was no trauma.  Weakness in both lower extremities, numbness and tingling in the legs, loss of balance.     REVIEW OF SYSTEMS :  Positive for balance problems, leg pain with walking and at rest, back pain, leg weakness, problems with coordination in the lower extremities.  PHYSICAL EXAMINATION :  Memory, language, attention span, and fund of knowledge are normal.  Speech is clear and fluent.  5/5 strength in both the upper and lower extremities.  Gait is otherwise normal.       PLAN : Harold [name] has lumbar stenosis which is severe at L2-3 secondary to epidural lipomatosis and some facet hypertrophy, along with ligamentous hypertrophy.  I do think this is the reason for his discomfort.  We discussed a lumbar decompression and he is more than happy to go forward with that.  We will do it some time in January.  This will not need hardware.  This is actually not the adjacent segment, so there is no risk I believe of doing this without doing hardware.  Risks and benefits were explained, but he has had two  lumbar procedures, so he is well-versed.

## 2020-07-26 NOTE — Evaluation (Signed)
Occupational Therapy Evaluation Patient Details Name: Harold [name] MRN: [id number] DOB: June 17, 1955 Today's Date: 07/26/2020    History of Present Illness Pt is a 65 y/o male s/p L2-3 laminectomy. PMH includes DM.  Five prior back surgeries.   Clinical Impression   Patient admitted for the procedure above.  PTA he lives with his spouse, who is able to provide assist as needed.  The patient reports he was able to complete all his own ADL/IADL, and needed no assist with mobility.  Patient is experiencing mild dizziness and knee buckle, but no LOB.  He is very close to his baseline for ADL completion and in room mobility, and no further acute OT needs exist.  All questions answered, and precaution sheet reviewed as needed.       Follow Up Recommendations  No OT follow up    Equipment Recommendations  Tub/shower seat    Recommendations for Other Services       Precautions / Restrictions Precautions Precautions: Back Precaution Booklet Issued: Yes (comment) Precaution Comments: Reviewed back precautions with pt. Restrictions Weight Bearing Restrictions: No      Mobility Bed Mobility Overal bed mobility: Needs Assistance Bed Mobility: Supine to Sit Rolling: Supervision   Supine to sit: Supervision   Sit to sidelying: Supervision General bed mobility comments: Supervision for safety. Educated about use of log roll technique to maintain back precautions. Patient Response: Cooperative  Transfers Overall transfer level: Needs assistance Equipment used: None Transfers: Sit to/from [facility] Sit to Stand: Supervision Stand pivot transfers: Supervision       General transfer comment: one knee buckle and dizziness with quick transitional movement.  No LOB.    Balance Overall balance assessment: Mild deficits observed, not formally tested Sitting-balance support: No upper extremity supported;Feet supported Sitting balance-Leahy Scale: Good                                ADL either performed or assessed with clinical judgement   ADL Overall ADL's : At baseline                                       General ADL Comments: Patient able to perform dressing task at sit/stand level.  Mod I with in room mobility, reaching for door frame.     Vision Baseline Vision/History: Wears glasses Wears Glasses: At all times Patient Visual Report: No change from baseline       Perception     Praxis      Pertinent Vitals/Pain Pain Assessment: Faces Faces Pain Scale: Hurts a little bit Pain Location: back Pain Descriptors / Indicators: Aching;Operative site guarding Pain Intervention(s): Monitored during session     Hand Dominance Right   Extremity/Trunk Assessment Upper Extremity Assessment Upper Extremity Assessment: Overall WFL for tasks assessed   Lower Extremity Assessment Lower Extremity Assessment: Defer to PT evaluation   Cervical / Trunk Assessment Cervical / Trunk Assessment: Other exceptions Cervical / Trunk Exceptions: s/p lumbar surgery   Communication Communication Communication: No difficulties   Cognition Arousal/Alertness: Awake/alert Behavior During Therapy: WFL for tasks assessed/performed Overall Cognitive Status: Within Functional Limits for tasks assessed  General Comments  Educated about progressive walking program    Exercises     Shoulder Instructions      Home Living Family/patient expects to be discharged to:: Private residence Living Arrangements: Spouse/significant other Available Help at Discharge: Family;Available 24 hours/day Type of Home: House Home Access: Stairs to enter [employer] of Steps: 1 (porch step) Entrance Stairs-Rails: None Home Layout: One level     Bathroom Shower/Tub: Tub/shower unit;Walk-in shower   Bathroom Toilet: Standard     Home Equipment: Cane - single point;Shower  seat - built in;Grab bars - tub/shower;Adaptive equipment Adaptive Equipment: Reacher;Long-handled sponge Additional Comments: Pt reports husband works from home and can assist as needed.      Prior Functioning/Environment Level of Independence: Independent with assistive device(s)        Comments: uses LH sponge and Reacher        OT Problem List: Impaired balance (sitting and/or standing);Pain      OT Treatment/Interventions:      OT Goals(Current goals can be found in the care plan section) Acute Rehab OT Goals Patient Stated Goal: to go home today OT Goal Formulation: With patient Time For Goal Achievement: 07/26/20 Potential to Achieve Goals: Good  OT Frequency:     Barriers to D/C:  none noted          Co-evaluation              AM-PAC OT "6 Clicks" Daily Activity     Outcome Measure Help from another person eating meals?: None Help from another person taking care of personal grooming?: None Help from another person toileting, which includes using toliet, bedpan, or urinal?: None Help from another person bathing (including washing, rinsing, drying)?: None Help from another person to put on and taking off regular upper body clothing?: None Help from another person to put on and taking off regular lower body clothing?: None 6 Click Score: 24   End of Session    Activity Tolerance: Patient tolerated treatment well Patient left: in bed;with call bell/phone within reach  OT Visit Diagnosis: Unsteadiness on feet (R26.81);Pain Pain - Right/Left:  (low back)                Time: 4967-5916 OT Time Calculation (min): 18 min Charges:  OT General Charges $OT Visit: 1 Visit OT Evaluation $OT Eval Moderate Complexity: 1 Mod  07/26/2020  Rich, OTR/L  Acute Rehabilitation Services  Office:  [place] 07/26/2020, 5:09 PM

## 2020-07-26 NOTE — Progress Notes (Signed)
Patient alert and oriented, voiding adequately, MAE well with no difficulty. Incision area cdi with no s/s of infection. Patient discharged home per order. Patient and husband stated understanding of discharge instructions given. Patient has an appointment with Dr. Vertell Limber

## 2020-07-27 ENCOUNTER — Encounter (HOSPITAL_COMMUNITY): Payer: Self-pay | Admitting: Neurosurgery

## 2020-08-02 ENCOUNTER — Telehealth: Payer: Self-pay | Admitting: Cardiology

## 2020-08-02 DIAGNOSIS — M1990 Unspecified osteoarthritis, unspecified site: Secondary | ICD-10-CM | POA: Diagnosis not present

## 2020-08-02 DIAGNOSIS — N289 Disorder of kidney and ureter, unspecified: Secondary | ICD-10-CM | POA: Diagnosis not present

## 2020-08-02 DIAGNOSIS — M109 Gout, unspecified: Secondary | ICD-10-CM | POA: Diagnosis not present

## 2020-08-02 DIAGNOSIS — Z79899 Other long term (current) drug therapy: Secondary | ICD-10-CM | POA: Diagnosis not present

## 2020-08-02 DIAGNOSIS — E1140 Type 2 diabetes mellitus with diabetic neuropathy, unspecified: Secondary | ICD-10-CM | POA: Diagnosis not present

## 2020-08-02 DIAGNOSIS — M25559 Pain in unspecified hip: Secondary | ICD-10-CM | POA: Diagnosis not present

## 2020-08-02 DIAGNOSIS — M25562 Pain in left knee: Secondary | ICD-10-CM | POA: Diagnosis not present

## 2020-08-02 NOTE — Telephone Encounter (Signed)
New message:     Patient calling stating that he had a EKG done at his PCP and would like to speak with a nurse concering this issue. Please advise.

## 2020-08-02 NOTE — Telephone Encounter (Signed)
Left message to call back  

## 2020-08-02 NOTE — Telephone Encounter (Signed)
Called patient back - no answer , left message on  voicemail .  Dr Ellyn Hack reviewed  EKG on 07/31/20. Per Dr Ellyn Hack , nothing with new with  EKG  reading. Any question may call back .

## 2020-08-03 ENCOUNTER — Other Ambulatory Visit: Payer: Self-pay | Admitting: *Deleted

## 2020-08-03 NOTE — Patient Outreach (Signed)
[facility]) Care Management  08/03/2020  Shai Mckenzie [place] 30-May-1955 [id number]   Telephone Assessment-Unsuccessful  RN followed up with pt as discussed however unsuccessful outreach today. RN able to leave a HIPAA approved voice message requesting a call back.   Will scheduled another outreach over the next week on pt's progress with arranged an appointment at the recommended agency for counseling.  Raina Mina, RN Care Management Coordinator [place] Office [phone]

## 2020-08-09 ENCOUNTER — Other Ambulatory Visit: Payer: Self-pay | Admitting: *Deleted

## 2020-08-09 NOTE — Patient Outreach (Signed)
[facility]) Care Management  08/09/2020  Harold [name] 1955/04/07 [id number]   Telephone Assessment-Unsuccessful  RN received a call back from pt shortly after the last call indicating he had a pending referral for a therapeutist Lorita Officer). Pt was at that time awaiting a call back. RN left a HIPAA approved voice message today requesting an update on that pending appointment.  Will await a call back or follow up over the 2-3 weeks.   Raina Mina, RN Care Management Coordinator [place] Office [phone]

## 2020-08-15 ENCOUNTER — Encounter: Payer: Self-pay | Admitting: Podiatry

## 2020-08-15 ENCOUNTER — Ambulatory Visit: Payer: Medicare Other | Admitting: Podiatry

## 2020-08-15 ENCOUNTER — Other Ambulatory Visit: Payer: Self-pay

## 2020-08-15 DIAGNOSIS — L97522 Non-pressure chronic ulcer of other part of left foot with fat layer exposed: Secondary | ICD-10-CM | POA: Diagnosis not present

## 2020-08-15 DIAGNOSIS — E1142 Type 2 diabetes mellitus with diabetic polyneuropathy: Secondary | ICD-10-CM

## 2020-08-15 DIAGNOSIS — I739 Peripheral vascular disease, unspecified: Secondary | ICD-10-CM

## 2020-08-15 MED ORDER — MUPIROCIN 2 % EX OINT: TOPICAL_OINTMENT | CUTANEOUS | 2 refills | Status: DC

## 2020-08-15 NOTE — Progress Notes (Signed)
  Subjective:  Patient ID: Harold [name], male    DOB: 19-Sep-1955,  MRN: [id number]  Chief Complaint  Patient presents with   Blister     for at risk diabetic foot care    65 y.o. male returns with the above complaint. History confirmed with patient.  Doing well the blisters have turned into wounds at this point and has been applying ointment  Objective:  Physical Exam: no trophic changes or ulcerative lesions, reduced sensation at plantar pulps of toes and feet, large spider veins, varicosities and torturous veins in the lower legs along with venous stasis dermatitis noted and +1 PT and DP pulses bilaterally, both feet are warm and well-perfused.  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  There is a callus on the medial hallux. right dorsal fifth PIPJ ulcer has healed with overlying hyperkeratosis.   Medial second toe ulceration measuring 6 mm x 8 mm x 1 mm, limited to breakdown of skin with exposed fat layer and no exposed tendon bone or joint, no cellulitis or infection Assessment:   1. Skin ulcer of toe of left foot with fat layer exposed ([place])   2. PAD (peripheral artery disease) ([place])   3. Diabetic polyneuropathy associated with type 2 diabetes mellitus ([place])       Plan:  Patient was evaluated and treated and all questions answered.  Patient educated on diabetes. Discussed proper diabetic foot care and discussed risks and complications of disease. Educated patient in depth on reasons to return to the office immediately should he/she discover anything concerning or new on the feet. All questions answered. Discussed proper shoes as well.   Discussed the etiology and treatment options for the condition in detail with the patient. Educated patient on  the topical and oral treatment options for mycotic nails. Recommended debridement of the nails today. Sharp and mechanical debridement performed of all painful and mycotic nails today. Nails debrided in length and thickness using a nail nipper to level of comfort. Discussed treatment options including appropriate shoe gear. Follow up as needed for painful nails.  Ulcer left second toe -Debridement as below. -Dressed with antibiotic ointment, DSD. -Continue off-loading with surgical shoe.  Procedure: Excisional Debridement of Wound Rationale: Removal of non-viable soft tissue from the wound to promote healing.  Anesthesia: none Post-Debridement Wound Measurements: 0.6 cm x 0.8 cm x 0.1 cm  Type of Debridement: Sharp Excisional Tissue Removed: Non-viable soft tissue Depth of Debridement: subcutaneous tissue. Technique: Sharp excisional debridement to bleeding, viable wound base.  Dressing: Dry, sterile, compression dressing. Disposition: Patient tolerated procedure well.      Return in about 1 month (around 09/14/2020) for wound care.

## 2020-08-31 DIAGNOSIS — E039 Hypothyroidism, unspecified: Secondary | ICD-10-CM | POA: Diagnosis not present

## 2020-08-31 DIAGNOSIS — I10 Essential (primary) hypertension: Secondary | ICD-10-CM | POA: Diagnosis not present

## 2020-08-31 DIAGNOSIS — E1140 Type 2 diabetes mellitus with diabetic neuropathy, unspecified: Secondary | ICD-10-CM | POA: Diagnosis not present

## 2020-08-31 DIAGNOSIS — E785 Hyperlipidemia, unspecified: Secondary | ICD-10-CM | POA: Diagnosis not present

## 2020-09-01 ENCOUNTER — Ambulatory Visit: Payer: Self-pay | Admitting: *Deleted

## 2020-09-07 DIAGNOSIS — Z0000 Encounter for general adult medical examination without abnormal findings: Secondary | ICD-10-CM | POA: Diagnosis not present

## 2020-09-07 DIAGNOSIS — E1140 Type 2 diabetes mellitus with diabetic neuropathy, unspecified: Secondary | ICD-10-CM | POA: Diagnosis not present

## 2020-09-07 DIAGNOSIS — I10 Essential (primary) hypertension: Secondary | ICD-10-CM | POA: Diagnosis not present

## 2020-09-07 DIAGNOSIS — G4733 Obstructive sleep apnea (adult) (pediatric): Secondary | ICD-10-CM | POA: Diagnosis not present

## 2020-09-07 DIAGNOSIS — I739 Peripheral vascular disease, unspecified: Secondary | ICD-10-CM | POA: Diagnosis not present

## 2020-09-07 DIAGNOSIS — E039 Hypothyroidism, unspecified: Secondary | ICD-10-CM | POA: Diagnosis not present

## 2020-09-07 DIAGNOSIS — I5032 Chronic diastolic (congestive) heart failure: Secondary | ICD-10-CM | POA: Diagnosis not present

## 2020-09-07 DIAGNOSIS — R400 Somnolence: Secondary | ICD-10-CM | POA: Diagnosis not present

## 2020-09-07 DIAGNOSIS — Z23 Encounter for immunization: Secondary | ICD-10-CM | POA: Diagnosis not present

## 2020-09-07 DIAGNOSIS — I2510 Atherosclerotic heart disease of native coronary artery without angina pectoris: Secondary | ICD-10-CM | POA: Diagnosis not present

## 2020-09-07 DIAGNOSIS — N1832 Chronic kidney disease, stage 3b: Secondary | ICD-10-CM | POA: Diagnosis not present

## 2020-09-14 ENCOUNTER — Ambulatory Visit: Payer: Medicare Other | Admitting: Podiatry

## 2020-09-14 ENCOUNTER — Other Ambulatory Visit: Payer: Self-pay

## 2020-09-14 DIAGNOSIS — L97512 Non-pressure chronic ulcer of other part of right foot with fat layer exposed: Secondary | ICD-10-CM

## 2020-09-14 DIAGNOSIS — L97522 Non-pressure chronic ulcer of other part of left foot with fat layer exposed: Secondary | ICD-10-CM | POA: Diagnosis not present

## 2020-09-14 DIAGNOSIS — E1142 Type 2 diabetes mellitus with diabetic polyneuropathy: Secondary | ICD-10-CM | POA: Diagnosis not present

## 2020-09-14 DIAGNOSIS — I739 Peripheral vascular disease, unspecified: Secondary | ICD-10-CM

## 2020-09-14 NOTE — Patient Instructions (Signed)
Look for urea 40% cream or ointment (we sell a brand called RevitaDerm) and apply to the thickened dry skin / calluses. This can be bought over the counter, at a pharmacy or online such as [employer].

## 2020-09-15 ENCOUNTER — Other Ambulatory Visit: Payer: Self-pay | Admitting: *Deleted

## 2020-09-15 NOTE — Patient Outreach (Signed)
[facility]) Care Management  09/15/2020  Erven Ramson [place] 10-May-1955 [id number]   Telephone Assessment-Unsuccessful   RN assistance pt earlier in June with needed resource related to psychiatric services and ongoing therapy needs. RN attempted outreach call today however only able to leave a HIPAA approved voice message requesting a call back.  Will attempt another outreach call over the next month. Will also send outreach letter to request an outreach to this RN case Freight forwarder.  Raina Mina, RN Care Management Coordinator [place] Office [phone]

## 2020-09-17 NOTE — Progress Notes (Signed)
  Subjective:  Patient ID: Harold [name], male    DOB: July 21, 1955,  MRN: [id number]  Chief Complaint  Patient presents with   Foot Ulcer     for at risk diabetic foot care - 1 month follow up left foot ulcer    65 y.o. male returns with the above complaint. History confirmed with patient.  Wounds have been healing he still using the ointment  Objective:  Physical Exam: no trophic changes or ulcerative lesions, reduced sensation at plantar pulps of toes and feet, large spider veins, varicosities and torturous veins in the lower legs along with venous stasis dermatitis noted and +1 PT and DP pulses bilaterally, both feet are warm and well-perfused.  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  There is a callus on the medial hallux. right dorsal fifth PIPJ ulcer has healed with overlying hyperkeratosis.   Ulcerations right fifth toe and left second toe     Assessment:   1. PAD (peripheral artery disease) ([place])   2. Diabetic polyneuropathy associated with type 2 diabetes mellitus ([place])   3. Skin ulcer of toe of left foot with fat layer exposed ([place])   4. Skin ulcer of toe of right foot with fat layer exposed ([place])        Plan:  Patient was evaluated and treated and all questions answered.  Patient educated on diabetes. Discussed proper diabetic foot care and discussed risks and complications of disease. Educated patient in depth on reasons to return to the office immediately should he/she discover anything concerning or new on the feet. All questions answered. Discussed proper shoes as well.     Ulcer left second toe -Doing much better continue using mupirocin and dry sterile dressings likely expect this to be healed by next visit    Return in  about 1 month (around 10/15/2020) for wound care.

## 2020-10-03 ENCOUNTER — Other Ambulatory Visit: Payer: Self-pay

## 2020-10-03 ENCOUNTER — Encounter: Payer: Self-pay | Admitting: Cardiovascular Disease

## 2020-10-03 ENCOUNTER — Ambulatory Visit: Payer: Medicare Other | Admitting: Cardiovascular Disease

## 2020-10-03 VITALS — BP 150/77 | HR 70 | Ht 72.0 in | Wt 327.2 lb

## 2020-10-03 DIAGNOSIS — E785 Hyperlipidemia, unspecified: Secondary | ICD-10-CM

## 2020-10-03 DIAGNOSIS — I10 Essential (primary) hypertension: Secondary | ICD-10-CM

## 2020-10-03 DIAGNOSIS — I2510 Atherosclerotic heart disease of native coronary artery without angina pectoris: Secondary | ICD-10-CM

## 2020-10-03 DIAGNOSIS — I739 Peripheral vascular disease, unspecified: Secondary | ICD-10-CM

## 2020-10-03 NOTE — Progress Notes (Signed)
Cardiology Office Note   Date:  10/03/2020   ID:  BURNHAM TROST, DOB 09-04-1955, MRN [id number]  PCP:  Deland Pretty, MD  Cardiologist: Dr. Ellyn Hack.  No chief complaint on file.     History of Present Illness: Harold [name] is a 65 y.o. male who is here today for follow-up visit regarding peripheral arterial disease.    He has known history of carotid disease, obstructive sleep apnea on CPAP, peripheral arterial disease, essential hypertension, obesity, chronic kidney disease, type 2 diabetes and hyperlipidemia.  He has known history of coronary artery disease with previous stenting.  He is not a smoker. He was seen in 2021 for bilateral calf claudication worse on the right. He underwent noninvasive vascular evaluation which showed an ABI of 0.72 on the right and 0.90 on the left. Duplex showed severe disease in the right TP trunk with occluded anterior tibial. On the left, the anterior tibial was occluded. Angiography was performed in August 2021 which showed mild nonobstructive iliac disease.  Imaging of the right lower extremity showed no obstructive disease affecting the SFA or popliteal artery.  There was significant calcified stenosis in the TP trunk with occluded anterior tibial artery and significant disease in the proximal posterior tibial artery which was the dominant vessel below the knee.  I performed successful balloon angioplasty of the right TP trunk into the posterior tibial artery followed by drug-coated balloon angioplasty to the TP trunk. Post procedure vascular studies showed an ABI of 0.9 on the right and 0.99 on the left.  Normal velocities were noted in the right TP trunk and posterior tibial artery.  He recently developed a small ulceration on the right small toe after he stopped the toe.  The ulceration healed.  In addition, he had an ulceration in the left second toe starting as a blister.  That also healed completely.  He describes stable bilateral  calf claudication.  No chest pain or shortness of breath.  Past Medical History:  Diagnosis Date   Anginal pain [facility]) March 2015   Cardiac cath showed patent stents with distal LAD, circumflex-OM and RCA disease in small vessels.   Anxiety    CAD S/P percutaneous coronary angioplasty 2003, 04/2012   status post PCI to LAD, circumflex-OM 2, RCA   Carotid artery occlusion    Chronic renal insufficiency, stage II (mild)    Chronic venous insufficiency    varicosities, no reflux; dopplers 04/14/12- valvular insufficiency in the R and L GSV   Complication of anesthesia    COPD, mild (HCC)    Depression    situaltional    Diabetes mellitus    Diabetic neuropathy ([place]) 08/24/2019   Dyslipidemia associated with type 2 diabetes mellitus (HCC)    GERD (gastroesophageal reflux disease)    HTN (hypertension)    Hypothyroidism    Neuromuscular disorder (HCC)    neuropathy in feet   Neuropathy    notably improved following PCI with improved cardiac function   Obesity    Obesity, Class II, BMI 35.0-39.9, with comorbidity (see actual BMI)    BMI 39; wgt loss efforts in place; seeing Dietician   PONV (postoperative nausea and vomiting)    "[employer]"   Pulmonary hypertension ([place]) 04/2012   PA pressure 36mhg   Sleep apnea    uses nightly    Past Surgical History:  Procedure Laterality Date   ABDOMINAL AORTAGRAM N/A 11/15/2011   Procedure: ABDOMINAL AMaxcine Ham  Surgeon: CElam Dutch MD;  Location: [place] CATH LAB;  Service: Cardiovascular;  [name];   ABDOMINAL AORTOGRAM W/LOWER EXTREMITY N/A 10/13/2019   Procedure: ABDOMINAL AORTOGRAM W/LOWER EXTREMITY;  Surgeon: Wellington Hampshire, MD;  Location: [place] CV LAB;  Service: Cardiovascular;  [name];   ABIs  04/27/2012   mild bilateral arterial insufficiency   BACK SURGERY  2005 x1   X2-2010   BUNIONECTOMY Right 11/11/2013   Procedure: RIGHT FOOT SILVER BUNIONECTOMY;  Surgeon: Wylene Simmer, MD;  Location: [place];  Service: Orthopedics;  Laterality: Right;   CARDIAC CATHETERIZATION  12/11/2001   significant 3V CAD, normal LV function   Carotid Doppler  04/27/2012   right internal carotid: Elevated velocities but no evidence of plaque. Left internal carotid 40-59%   CAROTID ENDARTERECTOMY  2005   Right; recent carotid Dopplers notes elevated velocities.    colonscopy     CORONARY ANGIOPLASTY  12/21/2001   PTCA of the distal and mid AV groove circ, unsuccessful PTCA of second OM total occlusion, unsuccessful PTCA of the apical LAD total occlusion   CORONARY ANGIOPLASTY WITH STENT PLACEMENT  04/29/12   PCI to 3 RCA lesions, Promus Premiere 2.38mx8mm distally, mid was 2.529m8mm and proximally 2.75x1654mEF 55-60%   CORONARY STENT PLACEMENT  04/28/12   PCI to LAD (3x23m61mence DES postdilated to 3.25) and circ prox and mid (2 overlappinmg 2.5mmx59mmXience DES postdilated to 2.75mm)20mOPPLER ECHOCARDIOGRAPHY  04/28/2012   poor quality study: EF estimated 60-65%; unable to assess diastolic function (previously noted to have diastolic dysfunction); severely dilated left atrium and mild right atrium; dilated IVC consistent with increased central venous pressure..   HEMarland KitchenNIA REPAIR     LEFT AND RIGHT HEART CATHETERIZATION WITH CORONARY ANGIOGRAM N/A 04/27/2012   Procedure: LEFT AND RIGHT HEART CATHETERIZATION WITH CORONARY ANGIOGRAM;  Surgeon: David Leonie Man [facility];  Service: Cardiovascular;  [name];   LEFT AND RIGHT HEART CATHETERIZATION WITH CORONARY ANGIOGRAM N/A 05/14/2013   Procedure: LEFT AND RIGHT HEART CATHETERIZATION WITH CORONARY ANGIOGRAM;  Surgeon: ThomasTroy Sine [facility]: Moderate Pulm HTN: 46/16 - mean 33 mmHg; PCWP 22mmHg55[name], [name], Patent RCA ostial mid and distal stents - 70% distal runoff Dz   LUMBAR LAMINECTOMY/DECOMPRESSION MICRODISCECTOMY   01/07/2012   Procedure: LUMBAR LAMINECTOMY/DECOMPRESSION MICRODISCECTOMY 1 LEVEL;  Surgeon: Kyle L Winfield CunasLocation: [facility] NEUR[facility];  Service: Neurosurgery;  Laterality: Bilateral;   Lumbar Three-Four Decompression   LUMBAR LAMINECTOMY/DECOMPRESSION MICRODISCECTOMY N/A 07/26/2020   Procedure: Lumbar two-three Laminectomy/Foraminotomy;  Surgeon: CabbellAshok PallLocation: [facility] OR; [place]: Neurosurgery;  [name];   PERCUTANEOUS CORONARY STENT INTERVENTION (PCI-S) N/A 04/28/2012   Procedure: PERCUTANEOUS CORONARY STENT INTERVENTION (PCI-S);  Surgeon: Thomas Troy Sine[facility];  Service: Cardiovascular;  [name];   PERCUTANEOUS CORONARY STENT INTERVENTION (PCI-S) N/A 04/29/2012   Procedure: PERCUTANEOUS CORONARY STENT INTERVENTION (PCI-S);  Surgeon: David WLeonie Man[facility];  Service: Cardiovascular;  [name];   PERIPHERAL VASCULAR BALLOON ANGIOPLASTY Right 10/13/2019   Procedure: PERIPHERAL VASCULAR BALLOON ANGIOPLASTY;  Surgeon: Lashawnna Lambrecht, Wellington HampshireLocation[facility];  Service: Cardiovascular;  Laterality: Right;   SPINE SURGERY     UMBILICAL HERNIA REPAIR  2009   steel mesh insert     Current Outpatient Medications  Medication Sig Dispense  Refill   allopurinol (ZYLOPRIM) 100 MG tablet Take 100 mg by mouth 2 (two) times daily.     B-D ULTRAFINE III SHORT PEN 31G X 8 MM MISC Inject 1 each into the skin 3 (three) times daily.     carvedilol (COREG) 12.5 MG tablet TAKE 1 TABLET (12.5 MG TOTAL) BY MOUTH 2 (TWO) TIMES DAILY WITH A MEAL. 180 tablet 3   clopidogrel (PLAVIX) 75 MG tablet TAKE 1 TABLET BY MOUTH EVERY DAY (Patient taking differently: Take 75 mg by mouth daily.) 90 tablet 1   COLCRYS 0.6 MG tablet Take 0.6 mg by mouth daily.     Continuous Blood Gluc Sensor (FREESTYLE LIBRE 14 DAY SENSOR) MISC APPLY 1 SENSOR EVERY 14 DAYS  11   DULoxetine (CYMBALTA) 30 MG capsule duloxetine 30 mg capsule,delayed release  TAKE 1  CAPSULE BY MOUTH EVERY DAY     DULoxetine (CYMBALTA) 60 MG capsule Take 60 mg by mouth daily.     empagliflozin (JARDIANCE) 25 MG TABS tablet Take 25 mg by mouth daily.      escitalopram (LEXAPRO) 20 MG tablet Take 20 mg by mouth daily.     FARXIGA 10 MG TABS tablet Take 10 mg by mouth daily.     furosemide (LASIX) 80 MG tablet Take 160 mg by mouth 3 (three) times daily.     gabapentin (NEURONTIN) 300 MG capsule gabapentin 300 mg capsule  PLEASE SEE ATTACHED FOR DETAILED DIRECTIONS     gabapentin (NEURONTIN) 800 MG tablet Take 1,600 mg by mouth 3 (three) times daily.     insulin regular human CONCENTRATED (HUMULIN R U-500 KWIKPEN) 500 UNIT/ML kwikpen Inject 120 Units into the skin 2 (two) times daily.     ketoconazole (NIZORAL) 2 % cream ketoconazole 2 % topical cream  APPLY TO AFFECTED AREA EVERY DAY     levothyroxine (SYNTHROID, LEVOTHROID) 88 MCG tablet Take 88 mcg by mouth daily before breakfast.     losartan (COZAAR) 25 MG tablet losartan 25 mg tablet  TAKE 1 TABLET BY MOUTH EVERY DAY     losartan (COZAAR) 50 MG tablet Take 50 mg by mouth daily.     mupirocin ointment (BACTROBAN) 2 % APPLY TO AFFECTED AREA TWICE A DAY 22 g 2   nitroGLYCERIN (NITROSTAT) 0.4 MG SL tablet PLACE 1 TAB UNDER THE TONGUE EVERY 5 MINUTES AS NEEDED FOR CHEST PAIN (SEVERE PRESSURE OR TIGHTNESS) (Patient taking differently: Place 0.4 mg under the tongue every 5 (five) minutes as needed for chest pain.) 25 tablet 3   omeprazole (PRILOSEC) 20 MG capsule Take 1 capsule (20 mg total) by mouth daily. 90 capsule 3   OZEMPIC, 1 MG/DOSE, 4 MG/3ML SOPN Inject 2 mg as directed every Sunday.     potassium chloride (MICRO-K) 10 MEQ CR capsule Take 10 mEq by mouth daily.   5   rosuvastatin (CRESTOR) 20 MG tablet Take 20 mg by mouth daily.     tiZANidine (ZANAFLEX) 4 MG tablet Take 1 tablet (4 mg total) by mouth every 6 (six) hours as needed for muscle spasms. 60 tablet 0   No current facility-administered medications for this  visit.    Allergies:   Septra [bactrim], Penicillins, Metformin and related, Other, and Sulfamethoxazole-trimethoprim    Social History:  The patient  reports that he quit smoking about 17 years ago. His smoking use included cigarettes. He has a 42.00 pack-year smoking history. He has never used smokeless tobacco. He reports that he does not drink alcohol and does  not use drugs.   Family History:  The patient's He was adopted. Family history is unknown by patient.    ROS:  Please see the history of present illness.   Otherwise, review of systems are positive for none.   All other systems are reviewed and negative.    PHYSICAL EXAM: VS:  BP (!) 150/77   Pulse 70   Ht 6' (1.829 m)   Wt (!) 327 lb 3.2 oz (148.4 kg)   SpO2 98%   BMI 44.38 kg/m  , BMI Body mass index is 44.38 kg/m. GEN: Well nourished, well developed, in no acute distress  HEENT: normal  Neck: no JVD, carotid bruits, or masses Cardiac: RRR; no murmurs, rubs, or gallops,no edema  Respiratory:  clear to auscultation bilaterally, normal work of breathing GI: soft, nontender, nondistended, + BS MS: no deformity or atrophy  Skin: warm and dry, no rash Neuro:  Strength and sensation are intact Psych: euthymic mood, full affect Vascular: Femoral pulses +1 bilaterally.     EKG:  EKG is not ordered today.    Recent Labs: 07/24/2020: BUN 38; Potassium 3.5; Sodium 135 07/26/2020: Creatinine, Ser 2.19; Hemoglobin 15.1; Platelets 163    Lipid Panel    Component Value Date/Time   CHOL 178 05/12/2018 1150   TRIG 343 (H) 05/12/2018 1150   HDL 42 05/12/2018 1150   CHOLHDL 4.2 05/12/2018 1150   CHOLHDL 6.0 08/30/2014 1437   VLDL NOT CALC 08/30/2014 1437   LDLCALC 67 05/12/2018 1150      Wt Readings from Last 3 Encounters:  10/03/20 (!) 327 lb 3.2 oz (148.4 kg)  07/26/20 (!) 331 lb 9.2 oz (150.4 kg)  07/24/20 (!) 339 lb (153.8 kg)       No flowsheet data found.    ASSESSMENT AND PLAN:  1. Peripheral  arterial disease: Status post angioplasty to the right TP trunk and posterior tibial artery for severe claudication in 2021.   He had recent ulceration recently that healed completely.  He does complain of mild bilateral calf claudication.  He is overdue for an ABI and lower extremity arterial duplex which were ordered today.  2. Coronary artery disease involving native coronary arteries without angina: Continue medical therapy.  3. Essential hypertension: His blood pressure is elevated today.  He is on multiple antihypertensive medications and he was rushing to the appointment.  I elected not to adjust any of the medications today.  Continue to monitor closely.  4. Hyperlipidemia: Currently on rosuvastatin 20 mg daily. Most recent lipid profile showed an LDL of 67 which is at target.    Disposition:   FU with me in 6 months  Signed,  Kathlyn Sacramento, MD  10/03/2020 1:22 PM    [facility]

## 2020-10-03 NOTE — Patient Instructions (Signed)
Medication Instructions:  No Changes In Medications at this time.  *If you need a refill on your cardiac medications before your next appointment, please call your pharmacy*  Testing/Procedures: Your physician has requested that you have a lower extremity arterial duplex. During this test, ultrasound is used to evaluate arterial blood flow in the legs. Allow one hour for this exam. There are no restrictions or special instructions. This will take place at [place], [address].  Your physician has requested that you have an ankle brachial index (ABI). During this test an ultrasound and blood pressure cuff are used to evaluate the arteries that supply the arms and legs with blood. Allow thirty minutes for this exam. There are no restrictions or special instructions. This will take place at [place], [address].   Follow-Up: At [facility], you and your health needs are our priority.  As part of our continuing mission to provide you with exceptional heart care, we have created designated Provider Care Teams.  These Care Teams include your primary Cardiologist (physician) and Advanced Practice Providers (APPs -  Physician Assistants and Nurse Practitioners) who all work together to provide you with the care you need, when you need it.  Your next appointment:   6 month(s)  The format for your next appointment:   In Person  Provider:   Kathlyn Sacramento, MD

## 2020-10-09 DIAGNOSIS — R400 Somnolence: Secondary | ICD-10-CM | POA: Diagnosis not present

## 2020-10-09 DIAGNOSIS — I10 Essential (primary) hypertension: Secondary | ICD-10-CM | POA: Diagnosis not present

## 2020-10-10 DIAGNOSIS — I2510 Atherosclerotic heart disease of native coronary artery without angina pectoris: Secondary | ICD-10-CM | POA: Diagnosis not present

## 2020-10-10 DIAGNOSIS — Z794 Long term (current) use of insulin: Secondary | ICD-10-CM | POA: Diagnosis not present

## 2020-10-10 DIAGNOSIS — E1165 Type 2 diabetes mellitus with hyperglycemia: Secondary | ICD-10-CM | POA: Diagnosis not present

## 2020-10-10 DIAGNOSIS — E785 Hyperlipidemia, unspecified: Secondary | ICD-10-CM | POA: Diagnosis not present

## 2020-10-10 DIAGNOSIS — I10 Essential (primary) hypertension: Secondary | ICD-10-CM | POA: Diagnosis not present

## 2020-10-10 DIAGNOSIS — I5032 Chronic diastolic (congestive) heart failure: Secondary | ICD-10-CM | POA: Diagnosis not present

## 2020-10-10 DIAGNOSIS — E039 Hypothyroidism, unspecified: Secondary | ICD-10-CM | POA: Diagnosis not present

## 2020-10-17 ENCOUNTER — Other Ambulatory Visit: Payer: Self-pay | Admitting: *Deleted

## 2020-10-17 NOTE — Patient Outreach (Signed)
[facility]) Care Management  10/17/2020  Harold [name] March 07, 1955 [id number]   Case Closure  Several outreach calls however unsuccessful however pt has left a message earlier on that he was still working on the provider resources awaiting pending appointments. RN has not been able to confirm or reach the pt to confirm the available assistance provider to pt several months ago for needed services.  RN has alerted Dr. Shelia Media of pt's disposition with [facility] services for case closure at this time. RN left another HIPAA approved voice message today at the 4th unsuccessful outreach.  Raina Mina, RN Care Management Coordinator [place] Office [phone]

## 2020-10-18 ENCOUNTER — Other Ambulatory Visit: Payer: Self-pay

## 2020-10-18 ENCOUNTER — Ambulatory Visit (HOSPITAL_COMMUNITY)
Admission: RE | Admit: 2020-10-18 | Discharge: 2020-10-18 | Disposition: A | Discharge status: Home / Self Care | Payer: Medicare Other | Source: Ambulatory Visit | Attending: Internal Medicine | Admitting: Internal Medicine

## 2020-10-18 DIAGNOSIS — Z9862 Peripheral vascular angioplasty status: Secondary | ICD-10-CM | POA: Diagnosis not present

## 2020-10-18 DIAGNOSIS — I739 Peripheral vascular disease, unspecified: Secondary | ICD-10-CM

## 2020-10-19 ENCOUNTER — Ambulatory Visit: Payer: Medicare Other | Admitting: Podiatry

## 2020-10-24 ENCOUNTER — Telehealth: Payer: Self-pay | Admitting: Cardiology

## 2020-10-24 NOTE — Telephone Encounter (Signed)
Left a message for the patient to call back.  Harold Hampshire, MD  Ricci Barker, RN ABI decreased on both sides but the area we worked on on the right side still looks fine.  Continue medical therapy and follow-up with me as planned early next year.  If his leg symptoms worsen, he can see me before then.

## 2020-10-24 NOTE — Telephone Encounter (Signed)
Patient states he is returning a call. I did not see any notes.

## 2020-10-27 ENCOUNTER — Encounter: Payer: Self-pay | Admitting: *Deleted

## 2020-10-27 NOTE — Telephone Encounter (Signed)
Left a message for the patient to call back. Results have been mailed

## 2020-11-07 DIAGNOSIS — I5032 Chronic diastolic (congestive) heart failure: Secondary | ICD-10-CM | POA: Diagnosis not present

## 2020-11-07 DIAGNOSIS — I10 Essential (primary) hypertension: Secondary | ICD-10-CM | POA: Diagnosis not present

## 2020-11-07 DIAGNOSIS — E039 Hypothyroidism, unspecified: Secondary | ICD-10-CM | POA: Diagnosis not present

## 2020-11-07 DIAGNOSIS — Z794 Long term (current) use of insulin: Secondary | ICD-10-CM | POA: Diagnosis not present

## 2020-11-07 DIAGNOSIS — E785 Hyperlipidemia, unspecified: Secondary | ICD-10-CM | POA: Diagnosis not present

## 2020-11-07 DIAGNOSIS — E1165 Type 2 diabetes mellitus with hyperglycemia: Secondary | ICD-10-CM | POA: Diagnosis not present

## 2020-11-07 DIAGNOSIS — I2510 Atherosclerotic heart disease of native coronary artery without angina pectoris: Secondary | ICD-10-CM | POA: Diagnosis not present

## 2020-11-14 DIAGNOSIS — Z794 Long term (current) use of insulin: Secondary | ICD-10-CM | POA: Diagnosis not present

## 2020-11-14 DIAGNOSIS — E039 Hypothyroidism, unspecified: Secondary | ICD-10-CM | POA: Diagnosis not present

## 2020-11-14 DIAGNOSIS — I2510 Atherosclerotic heart disease of native coronary artery without angina pectoris: Secondary | ICD-10-CM | POA: Diagnosis not present

## 2020-11-14 DIAGNOSIS — I5032 Chronic diastolic (congestive) heart failure: Secondary | ICD-10-CM | POA: Diagnosis not present

## 2020-11-14 DIAGNOSIS — E785 Hyperlipidemia, unspecified: Secondary | ICD-10-CM | POA: Diagnosis not present

## 2020-11-14 DIAGNOSIS — E1165 Type 2 diabetes mellitus with hyperglycemia: Secondary | ICD-10-CM | POA: Diagnosis not present

## 2020-11-14 DIAGNOSIS — I10 Essential (primary) hypertension: Secondary | ICD-10-CM | POA: Diagnosis not present

## 2020-11-16 DIAGNOSIS — H5203 Hypermetropia, bilateral: Secondary | ICD-10-CM | POA: Diagnosis not present

## 2020-11-16 DIAGNOSIS — H524 Presbyopia: Secondary | ICD-10-CM | POA: Diagnosis not present

## 2020-11-16 DIAGNOSIS — Z794 Long term (current) use of insulin: Secondary | ICD-10-CM | POA: Diagnosis not present

## 2020-11-16 DIAGNOSIS — H52203 Unspecified astigmatism, bilateral: Secondary | ICD-10-CM | POA: Diagnosis not present

## 2020-11-16 DIAGNOSIS — Z961 Presence of intraocular lens: Secondary | ICD-10-CM | POA: Diagnosis not present

## 2020-11-16 DIAGNOSIS — E119 Type 2 diabetes mellitus without complications: Secondary | ICD-10-CM | POA: Diagnosis not present

## 2020-11-21 ENCOUNTER — Ambulatory Visit: Payer: Medicare Other | Admitting: Podiatry

## 2020-11-24 DIAGNOSIS — G4733 Obstructive sleep apnea (adult) (pediatric): Secondary | ICD-10-CM | POA: Diagnosis not present

## 2020-11-29 DIAGNOSIS — I2510 Atherosclerotic heart disease of native coronary artery without angina pectoris: Secondary | ICD-10-CM | POA: Diagnosis not present

## 2020-11-29 DIAGNOSIS — I5032 Chronic diastolic (congestive) heart failure: Secondary | ICD-10-CM | POA: Diagnosis not present

## 2020-11-29 DIAGNOSIS — I10 Essential (primary) hypertension: Secondary | ICD-10-CM | POA: Diagnosis not present

## 2020-11-29 DIAGNOSIS — E785 Hyperlipidemia, unspecified: Secondary | ICD-10-CM | POA: Diagnosis not present

## 2020-11-29 DIAGNOSIS — Z794 Long term (current) use of insulin: Secondary | ICD-10-CM | POA: Diagnosis not present

## 2020-11-29 DIAGNOSIS — E039 Hypothyroidism, unspecified: Secondary | ICD-10-CM | POA: Diagnosis not present

## 2020-11-29 DIAGNOSIS — E1165 Type 2 diabetes mellitus with hyperglycemia: Secondary | ICD-10-CM | POA: Diagnosis not present

## 2020-11-30 DIAGNOSIS — I951 Orthostatic hypotension: Secondary | ICD-10-CM | POA: Diagnosis not present

## 2020-11-30 DIAGNOSIS — H9319 Tinnitus, unspecified ear: Secondary | ICD-10-CM | POA: Diagnosis not present

## 2020-11-30 DIAGNOSIS — L219 Seborrheic dermatitis, unspecified: Secondary | ICD-10-CM | POA: Diagnosis not present

## 2020-11-30 DIAGNOSIS — R42 Dizziness and giddiness: Secondary | ICD-10-CM | POA: Diagnosis not present

## 2020-12-05 ENCOUNTER — Other Ambulatory Visit: Payer: Self-pay

## 2020-12-05 ENCOUNTER — Ambulatory Visit: Payer: Medicare Other | Admitting: Podiatry

## 2020-12-05 DIAGNOSIS — M79674 Pain in right toe(s): Secondary | ICD-10-CM | POA: Diagnosis not present

## 2020-12-05 DIAGNOSIS — M21612 Bunion of left foot: Secondary | ICD-10-CM

## 2020-12-05 DIAGNOSIS — E1142 Type 2 diabetes mellitus with diabetic polyneuropathy: Secondary | ICD-10-CM

## 2020-12-05 DIAGNOSIS — M79675 Pain in left toe(s): Secondary | ICD-10-CM | POA: Diagnosis not present

## 2020-12-05 DIAGNOSIS — B351 Tinea unguium: Secondary | ICD-10-CM

## 2020-12-05 DIAGNOSIS — M2012 Hallux valgus (acquired), left foot: Secondary | ICD-10-CM

## 2020-12-05 DIAGNOSIS — M21622 Bunionette of left foot: Secondary | ICD-10-CM

## 2020-12-05 DIAGNOSIS — L84 Corns and callosities: Secondary | ICD-10-CM

## 2020-12-05 DIAGNOSIS — M21621 Bunionette of right foot: Secondary | ICD-10-CM | POA: Diagnosis not present

## 2020-12-06 NOTE — Progress Notes (Signed)
  Subjective:  Patient ID: Harold [name], male    DOB: Jun 04, 1955,  MRN: [id number]  Chief Complaint  Patient presents with   Nail Problem    Thick painful toenails, 3 month follow up   PVD   Bunions    65 y.o. male returns with the above complaint. History confirmed with patient.  Doing well he thinks.  No new issues no new ulcerations.  Would like to plan for bunion surgery next year  Objective:  Physical Exam: no trophic changes or ulcerative lesions, reduced sensation at plantar pulps of toes and feet, large spider veins, varicosities and torturous veins in the lower legs along with venous stasis dermatitis noted and +1 PT and DP pulses bilaterally, both feet are warm and well-perfused.  Dry skin and fissuring plantar heel bilateral.  No ulcerations Left Foot: Moderate to severe hallux valgus with semirigid hammertoes present, mycotic nails x5,   Callus on medial hallux Right Foot: Mild to moderate hallux valgus with semirigid hammertoes present, mycotic nails since 5.  There is a callus on the medial hallux. right dorsal fifth PIPJ ulcer has healed with overlying hyperkeratosis.       Assessment:   1. Hallux valgus with bunions, left   2. Tailor's bunion of both feet   3. Corns and callus   4. Pain due to onychomycosis of toenails of both feet   5. Diabetic polyneuropathy associated with type 2 diabetes mellitus ([place])        Plan:  Patient was evaluated and treated and all questions answered.  Patient educated on diabetes. Discussed proper diabetic foot care and discussed risks and complications of disease. Educated patient in depth on reasons to return to the office immediately should he/she discover anything concerning or new on the feet. All questions answered. Discussed proper shoes as well.   All symptomatic hyperkeratoses were safely debrided with a sterile #15 blade to patient's level of comfort without incident. We discussed preventative and palliative care  of these lesions including supportive and accommodative shoegear, padding, prefabricated and custom molded accommodative orthoses, use of a pumice stone and lotions/creams daily.  Discussed the etiology and treatment options for the condition in detail with the patient. Educated patient on the topical and oral treatment options for mycotic nails. Recommended debridement of the nails today. Sharp and mechanical debridement performed of all painful and mycotic nails today. Nails debrided in length and thickness using a nail nipper to level of comfort. Discussed treatment options including appropriate shoe gear. Follow up as needed for painful nails.    Ulcer left second toe -Has completely healed at this point  We again revisited the idea of bunion correction as well as the tailor's bunion and hammertoes on the left and right feet.  The left foot would be the first to address.  He currently is experiencing vertigo and is getting this figured out.  Would like to plan for surgery next spring I think this would be a good timeframe as well.  We will pick a date for surgery and signed consent and book at next visit.  Discussed the recovery process the amount of weightbearing and the procedure rationale in detail.    Return in about 12 weeks (around 02/27/2021) for at risk diabetic foot care, bunion surgery planning.

## 2020-12-11 DIAGNOSIS — L219 Seborrheic dermatitis, unspecified: Secondary | ICD-10-CM | POA: Diagnosis not present

## 2020-12-11 DIAGNOSIS — I10 Essential (primary) hypertension: Secondary | ICD-10-CM | POA: Diagnosis not present

## 2020-12-11 DIAGNOSIS — W19XXXA Unspecified fall, initial encounter: Secondary | ICD-10-CM | POA: Diagnosis not present

## 2020-12-11 DIAGNOSIS — R2689 Other abnormalities of gait and mobility: Secondary | ICD-10-CM | POA: Diagnosis not present

## 2020-12-15 DIAGNOSIS — Z794 Long term (current) use of insulin: Secondary | ICD-10-CM | POA: Diagnosis not present

## 2020-12-15 DIAGNOSIS — E11621 Type 2 diabetes mellitus with foot ulcer: Secondary | ICD-10-CM | POA: Diagnosis not present

## 2020-12-15 DIAGNOSIS — E785 Hyperlipidemia, unspecified: Secondary | ICD-10-CM | POA: Diagnosis not present

## 2020-12-15 DIAGNOSIS — I10 Essential (primary) hypertension: Secondary | ICD-10-CM | POA: Diagnosis not present

## 2020-12-15 DIAGNOSIS — E039 Hypothyroidism, unspecified: Secondary | ICD-10-CM | POA: Diagnosis not present

## 2020-12-15 DIAGNOSIS — I2510 Atherosclerotic heart disease of native coronary artery without angina pectoris: Secondary | ICD-10-CM | POA: Diagnosis not present

## 2020-12-20 DIAGNOSIS — H8110 Benign paroxysmal vertigo, unspecified ear: Secondary | ICD-10-CM | POA: Diagnosis not present

## 2020-12-20 DIAGNOSIS — M6281 Muscle weakness (generalized): Secondary | ICD-10-CM | POA: Diagnosis not present

## 2020-12-20 DIAGNOSIS — R2689 Other abnormalities of gait and mobility: Secondary | ICD-10-CM | POA: Diagnosis not present

## 2020-12-20 DIAGNOSIS — R262 Difficulty in walking, not elsewhere classified: Secondary | ICD-10-CM | POA: Diagnosis not present

## 2021-01-03 DIAGNOSIS — E039 Hypothyroidism, unspecified: Secondary | ICD-10-CM | POA: Diagnosis not present

## 2021-01-03 DIAGNOSIS — E785 Hyperlipidemia, unspecified: Secondary | ICD-10-CM | POA: Diagnosis not present

## 2021-01-03 DIAGNOSIS — I10 Essential (primary) hypertension: Secondary | ICD-10-CM | POA: Diagnosis not present

## 2021-01-03 DIAGNOSIS — R21 Rash and other nonspecific skin eruption: Secondary | ICD-10-CM | POA: Diagnosis not present

## 2021-01-03 DIAGNOSIS — E11621 Type 2 diabetes mellitus with foot ulcer: Secondary | ICD-10-CM | POA: Diagnosis not present

## 2021-01-03 DIAGNOSIS — I2510 Atherosclerotic heart disease of native coronary artery without angina pectoris: Secondary | ICD-10-CM | POA: Diagnosis not present

## 2021-01-03 DIAGNOSIS — Z794 Long term (current) use of insulin: Secondary | ICD-10-CM | POA: Diagnosis not present

## 2021-01-03 DIAGNOSIS — Z23 Encounter for immunization: Secondary | ICD-10-CM | POA: Diagnosis not present

## 2021-01-10 DIAGNOSIS — I2510 Atherosclerotic heart disease of native coronary artery without angina pectoris: Secondary | ICD-10-CM | POA: Diagnosis not present

## 2021-01-10 DIAGNOSIS — I10 Essential (primary) hypertension: Secondary | ICD-10-CM | POA: Diagnosis not present

## 2021-01-10 DIAGNOSIS — Z794 Long term (current) use of insulin: Secondary | ICD-10-CM | POA: Diagnosis not present

## 2021-01-10 DIAGNOSIS — E785 Hyperlipidemia, unspecified: Secondary | ICD-10-CM | POA: Diagnosis not present

## 2021-01-10 DIAGNOSIS — E11621 Type 2 diabetes mellitus with foot ulcer: Secondary | ICD-10-CM | POA: Diagnosis not present

## 2021-01-10 DIAGNOSIS — E039 Hypothyroidism, unspecified: Secondary | ICD-10-CM | POA: Diagnosis not present

## 2021-01-19 DIAGNOSIS — E785 Hyperlipidemia, unspecified: Secondary | ICD-10-CM | POA: Diagnosis not present

## 2021-01-19 DIAGNOSIS — E11621 Type 2 diabetes mellitus with foot ulcer: Secondary | ICD-10-CM | POA: Diagnosis not present

## 2021-01-19 DIAGNOSIS — I10 Essential (primary) hypertension: Secondary | ICD-10-CM | POA: Diagnosis not present

## 2021-01-19 DIAGNOSIS — I2510 Atherosclerotic heart disease of native coronary artery without angina pectoris: Secondary | ICD-10-CM | POA: Diagnosis not present

## 2021-01-19 DIAGNOSIS — Z794 Long term (current) use of insulin: Secondary | ICD-10-CM | POA: Diagnosis not present

## 2021-01-19 DIAGNOSIS — E039 Hypothyroidism, unspecified: Secondary | ICD-10-CM | POA: Diagnosis not present

## 2021-01-23 DIAGNOSIS — R2689 Other abnormalities of gait and mobility: Secondary | ICD-10-CM | POA: Diagnosis not present

## 2021-01-23 DIAGNOSIS — R1032 Left lower quadrant pain: Secondary | ICD-10-CM | POA: Diagnosis not present

## 2021-01-25 ENCOUNTER — Other Ambulatory Visit: Payer: Self-pay | Admitting: Cardiovascular Disease

## 2021-01-25 NOTE — Telephone Encounter (Signed)
Refill request

## 2021-02-02 DIAGNOSIS — Z79899 Other long term (current) drug therapy: Secondary | ICD-10-CM | POA: Diagnosis not present

## 2021-02-05 DIAGNOSIS — I2510 Atherosclerotic heart disease of native coronary artery without angina pectoris: Secondary | ICD-10-CM | POA: Diagnosis not present

## 2021-02-05 DIAGNOSIS — I10 Essential (primary) hypertension: Secondary | ICD-10-CM | POA: Diagnosis not present

## 2021-02-05 DIAGNOSIS — E785 Hyperlipidemia, unspecified: Secondary | ICD-10-CM | POA: Diagnosis not present

## 2021-02-05 DIAGNOSIS — E11621 Type 2 diabetes mellitus with foot ulcer: Secondary | ICD-10-CM | POA: Diagnosis not present

## 2021-02-05 DIAGNOSIS — Z794 Long term (current) use of insulin: Secondary | ICD-10-CM | POA: Diagnosis not present

## 2021-02-05 DIAGNOSIS — E039 Hypothyroidism, unspecified: Secondary | ICD-10-CM | POA: Diagnosis not present

## 2021-02-06 ENCOUNTER — Other Ambulatory Visit: Payer: Self-pay | Admitting: *Deleted

## 2021-02-06 DIAGNOSIS — I6523 Occlusion and stenosis of bilateral carotid arteries: Secondary | ICD-10-CM

## 2021-02-09 DIAGNOSIS — R1032 Left lower quadrant pain: Secondary | ICD-10-CM | POA: Diagnosis not present

## 2021-02-22 ENCOUNTER — Ambulatory Visit: Payer: Medicare Other

## 2021-02-22 ENCOUNTER — Ambulatory Visit (HOSPITAL_COMMUNITY): Payer: Medicare Other

## 2021-02-28 DIAGNOSIS — K5900 Constipation, unspecified: Secondary | ICD-10-CM | POA: Diagnosis not present

## 2021-02-28 DIAGNOSIS — R1032 Left lower quadrant pain: Secondary | ICD-10-CM | POA: Diagnosis not present

## 2021-02-28 DIAGNOSIS — R0781 Pleurodynia: Secondary | ICD-10-CM | POA: Diagnosis not present

## 2021-02-28 DIAGNOSIS — R1012 Left upper quadrant pain: Secondary | ICD-10-CM | POA: Diagnosis not present

## 2021-03-01 ENCOUNTER — Other Ambulatory Visit: Payer: Self-pay

## 2021-03-01 ENCOUNTER — Other Ambulatory Visit: Payer: Self-pay | Admitting: Cardiology

## 2021-03-01 ENCOUNTER — Ambulatory Visit (INDEPENDENT_AMBULATORY_CARE_PROVIDER_SITE_OTHER): Payer: Medicare Other | Admitting: Podiatry

## 2021-03-01 DIAGNOSIS — M2012 Hallux valgus (acquired), left foot: Secondary | ICD-10-CM | POA: Diagnosis not present

## 2021-03-01 DIAGNOSIS — M79675 Pain in left toe(s): Secondary | ICD-10-CM

## 2021-03-01 DIAGNOSIS — M21621 Bunionette of right foot: Secondary | ICD-10-CM

## 2021-03-01 DIAGNOSIS — M21622 Bunionette of left foot: Secondary | ICD-10-CM

## 2021-03-01 DIAGNOSIS — M21612 Bunion of left foot: Secondary | ICD-10-CM

## 2021-03-01 DIAGNOSIS — M79674 Pain in right toe(s): Secondary | ICD-10-CM | POA: Diagnosis not present

## 2021-03-01 DIAGNOSIS — I739 Peripheral vascular disease, unspecified: Secondary | ICD-10-CM | POA: Diagnosis not present

## 2021-03-01 DIAGNOSIS — L84 Corns and callosities: Secondary | ICD-10-CM | POA: Diagnosis not present

## 2021-03-01 DIAGNOSIS — B351 Tinea unguium: Secondary | ICD-10-CM

## 2021-03-01 DIAGNOSIS — E1149 Type 2 diabetes mellitus with other diabetic neurological complication: Secondary | ICD-10-CM | POA: Diagnosis not present

## 2021-03-04 NOTE — Progress Notes (Signed)
Subjective:  Patient ID: Harold [name], male    DOB: 1955/11/08,  MRN: [id number]  Chief Complaint  Patient presents with   Nail Problem    Nail trim     66 y.o. male returns with the above complaint. History confirmed with patient.  Overall doing well no new issues or no new ulcerations.  Says his blood sugar has been up-and-down but thinks it is well controlled.  He is due to have his A1c checked again soon.  Objective:  Physical Exam: no trophic changes or ulcerative lesions, reduced sensation at plantar pulps of toes and feet, large spider veins, varicosities and torturous veins in the lower legs along with venous stasis dermatitis noted and +1 PT and DP pulses bilaterally, both feet are warm and well-perfused.  Dry skin and fissuring plantar heel bilateral.  No ulcerations Left Foot: Moderate to severe hallux valgus with semirigid hammertoes present, mycotic nails x5,   Callus on medial hallux Right Foot: Mild to moderate hallux valgus with semirigid hammertoes present, mycotic nails since 5.  There is a callus on the medial hallux. right dorsal fifth PIPJ ulcer has healed with overlying hyperkeratosis.       Assessment:   1. PAD (peripheral artery disease) (HCC)   2. Hallux valgus with bunions, left   3. Tailor's bunion of both feet   4. Corns and callus   5. Pain due to onychomycosis of toenails of both feet   6. Diabetes mellitus type 2 with neurological manifestations [facility])        Plan:  Patient was evaluated and treated and all questions answered.  Patient educated on diabetes. Discussed proper diabetic foot care and discussed risks and complications of disease. Educated patient in depth on reasons to return to the office immediately should he/she discover anything concerning or new on the feet. All questions answered. Discussed proper shoes as well.   All symptomatic hyperkeratoses were safely debrided with a sterile #15 blade to patient's level of comfort  without incident. We discussed preventative and palliative care of these lesions including supportive and accommodative shoegear, padding, prefabricated and custom molded accommodative orthoses, use of a pumice stone and lotions/creams daily.  Discussed the etiology and treatment options for the condition in detail with the patient. Educated patient on the topical and oral treatment options for mycotic nails. Recommended debridement of the nails today. Sharp and mechanical debridement performed of all painful and mycotic nails today. Nails debrided in length and thickness using a nail nipper to level of comfort. Discussed treatment options including appropriate shoe gear. Follow up as needed for painful nails.    We again discussed treatment of his hallux valgus deformity and reducible hammertoes on the left foot as well as the tailor's bunion.  I think surgically his best option would be a arthrodesis of the first metatarsal phalangeal joint and correction of the tailor's bunion on the left foot with bone graft from the heel.  Tenotomies may reduce the hammertoe deformities.  Discussed the risk benefits and potential complications of this.  I discussed with him that he is not without risk and is at high risk of complications due to his diabetes and previous history of peripheral arterial disease.  I would like to have his ABIs repeated an A1c checked again before surgery and he understands this.  We discussed all risks such as pain, swelling, infection, scar, numbness which may be temporary or permanent, chronic pain, stiffness, nerve pain or damage, wound healing problems, bone healing  problems including delayed or non-union.  We also discussed that if he develops such a complication that is severe he could be at risk of amputation of the feet.  He understands the risk and addressed all questions and he wishes to proceed.  Informed consent was signed and reviewed.  I will see him back again before surgery  for his routine visit and we will review the above.  Paperwork for surgery was completed today   Return in about 3 months (around 05/30/2021) for at risk diabetic foot care.

## 2021-03-08 ENCOUNTER — Other Ambulatory Visit: Payer: Self-pay | Admitting: Podiatry

## 2021-03-08 DIAGNOSIS — I739 Peripheral vascular disease, unspecified: Secondary | ICD-10-CM

## 2021-03-13 ENCOUNTER — Ambulatory Visit (HOSPITAL_COMMUNITY)
Admission: RE | Admit: 2021-03-13 | Discharge: 2021-03-13 | Disposition: A | Discharge status: Home / Self Care | Payer: Medicare Other | Source: Ambulatory Visit | Attending: Cardiology | Admitting: Cardiology

## 2021-03-13 ENCOUNTER — Other Ambulatory Visit: Payer: Self-pay

## 2021-03-13 DIAGNOSIS — I739 Peripheral vascular disease, unspecified: Secondary | ICD-10-CM | POA: Diagnosis not present

## 2021-03-13 DIAGNOSIS — R809 Proteinuria, unspecified: Secondary | ICD-10-CM | POA: Diagnosis not present

## 2021-03-13 DIAGNOSIS — N2581 Secondary hyperparathyroidism of renal origin: Secondary | ICD-10-CM | POA: Diagnosis not present

## 2021-03-13 DIAGNOSIS — I129 Hypertensive chronic kidney disease with stage 1 through stage 4 chronic kidney disease, or unspecified chronic kidney disease: Secondary | ICD-10-CM | POA: Diagnosis not present

## 2021-03-13 DIAGNOSIS — N183 Chronic kidney disease, stage 3 unspecified: Secondary | ICD-10-CM | POA: Diagnosis not present

## 2021-03-16 DIAGNOSIS — G4733 Obstructive sleep apnea (adult) (pediatric): Secondary | ICD-10-CM | POA: Diagnosis not present

## 2021-03-16 DIAGNOSIS — E65 Localized adiposity: Secondary | ICD-10-CM | POA: Diagnosis not present

## 2021-03-16 DIAGNOSIS — T8089XA Other complications following infusion, transfusion and therapeutic injection, initial encounter: Secondary | ICD-10-CM | POA: Diagnosis not present

## 2021-03-16 DIAGNOSIS — I2510 Atherosclerotic heart disease of native coronary artery without angina pectoris: Secondary | ICD-10-CM | POA: Diagnosis not present

## 2021-03-16 DIAGNOSIS — Z794 Long term (current) use of insulin: Secondary | ICD-10-CM | POA: Diagnosis not present

## 2021-03-16 DIAGNOSIS — R809 Proteinuria, unspecified: Secondary | ICD-10-CM | POA: Diagnosis not present

## 2021-03-16 DIAGNOSIS — E1165 Type 2 diabetes mellitus with hyperglycemia: Secondary | ICD-10-CM | POA: Diagnosis not present

## 2021-03-16 DIAGNOSIS — E039 Hypothyroidism, unspecified: Secondary | ICD-10-CM | POA: Diagnosis not present

## 2021-03-22 ENCOUNTER — Ambulatory Visit (HOSPITAL_COMMUNITY)
Admission: RE | Admit: 2021-03-22 | Discharge: 2021-03-22 | Disposition: A | Discharge status: Home / Self Care | Payer: Medicare Other | Source: Ambulatory Visit | Attending: Vascular Surgery | Admitting: Vascular Surgery

## 2021-03-22 ENCOUNTER — Other Ambulatory Visit: Payer: Self-pay

## 2021-03-22 ENCOUNTER — Ambulatory Visit: Payer: Medicare Other | Admitting: Physician Assistant

## 2021-03-22 VITALS — BP 159/77 | HR 73 | Temp 97.6°F | Resp 20 | Ht 72.0 in | Wt 330.4 lb

## 2021-03-22 DIAGNOSIS — I6523 Occlusion and stenosis of bilateral carotid arteries: Secondary | ICD-10-CM | POA: Insufficient documentation

## 2021-03-22 NOTE — Progress Notes (Signed)
History of Present Illness:  Patient is a 66 y.o. year old male who presents for evaluation of asymptomatic carotid stenosis.  He has history of right CEA in 2005 based on chart review.  The patient denies symptoms of TIA, amaurosis, or stroke.      He is medically managed on Plavix and Statin daily.  He states he has had elevated BP [id number] systolic that he and his PCP are working on.  He has put on extra weight secondary to multiple lumbar procedures.     He has know history of PAD and his LE are followed by Dr. Fletcher Anon.  Status post angioplasty to the right TP trunk and posterior tibial artery for severe claudication in 2021.   He had recent ulceration recently that healed completely.  He does complain of mild bilateral calf claudication.      Past Medical History:  Diagnosis Date   Anginal pain [facility]) March 2015   Cardiac cath showed patent stents with distal LAD, circumflex-OM and RCA disease in small vessels.   Anxiety    CAD S/P percutaneous coronary angioplasty 2003, 04/2012   status post PCI to LAD, circumflex-OM 2, RCA   Carotid artery occlusion    Chronic renal insufficiency, stage II (mild)    Chronic venous insufficiency    varicosities, no reflux; dopplers 04/14/12- valvular insufficiency in the R and L GSV   Complication of anesthesia    COPD, mild (HCC)    Depression    situaltional    Diabetes mellitus    Diabetic neuropathy ([place]) 08/24/2019   Dyslipidemia associated with type 2 diabetes mellitus (HCC)    GERD (gastroesophageal reflux disease)    HTN (hypertension)    Hypothyroidism    Neuromuscular disorder (HCC)    neuropathy in feet   Neuropathy    notably improved following PCI with improved cardiac function   Obesity    Obesity, Class II, 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-39.9, with comorbidity (see actual BMI)    BMI 39; wgt loss efforts in place; seeing Dietician   PONV (postoperative nausea and vomiting)    "[employer]"   Pulmonary hypertension ([place]) 04/2012   PA pressure  43mhg   Sleep apnea    uses nightly    Past Surgical History:  Procedure Laterality Date   ABDOMINAL AORTAGRAM N/A 11/15/2011   Procedure: ABDOMINAL AMaxcine Ham  Surgeon: CElam Dutch MD;  [facility];  Service: Cardiovascular;  [name];   ABDOMINAL AORTOGRAM W/LOWER EXTREMITY N/A 10/13/2019   Procedure: ABDOMINAL AORTOGRAM W/LOWER EXTREMITY;  Surgeon: AWellington Hampshire MD;  [facility];  Service: Cardiovascular;  [name];   ABIs  04/27/2012   mild bilateral arterial insufficiency   BACK SURGERY  2005 x1   X2-2010   BUNIONECTOMY Right 11/11/2013   Procedure: RIGHT FOOT SILVER BUNIONECTOMY;  Surgeon: JWylene Simmer MD;  [place] [place]  Service: Orthopedics;  Laterality: Right;   CARDIAC CATHETERIZATION  12/11/2001   significant 3V CAD, normal LV function   Carotid Doppler  04/27/2012   right internal carotid: Elevated velocities but no evidence of plaque. Left internal carotid 40-59%   CAROTID ENDARTERECTOMY  2005   Right; recent carotid Dopplers notes elevated velocities.    colonscopy     CORONARY ANGIOPLASTY  12/21/2001   PTCA of the distal and mid AV groove circ, unsuccessful PTCA of second OM total occlusion, unsuccessful PTCA of the apical LAD total occlusion   CORONARY ANGIOPLASTY WITH STENT  PLACEMENT  04/29/12   PCI to 3 RCA lesions, Promus Premiere 2.84mx8mm distally, mid was 2.584m8mm and proximally 2.75x1626mEF 55-60%   CORONARY STENT PLACEMENT  04/28/12   PCI to LAD (3x23m43mence DES postdilated to 3.25) and circ prox and mid (2 overlappinmg 2.5mmx27mmXience DES postdilated to 2.75mm)77mOPPLER ECHOCARDIOGRAPHY  04/28/2012   poor quality study: EF estimated 60-65%; unable to assess diastolic function (previously noted to have diastolic dysfunction); severely dilated left atrium and mild right atrium; dilated IVC consistent with increased central venous pressure..   HEMarland KitchenNIA REPAIR     LEFT AND RIGHT HEART  CATHETERIZATION WITH CORONARY ANGIOGRAM N/A 04/27/2012   Procedure: LEFT AND RIGHT HEART CATHETERIZATION WITH CORONARY ANGIOGRAM;  Surgeon: David Leonie Man [facility];  Service: Cardiovascular;  [name];   LEFT AND RIGHT HEART CATHETERIZATION WITH CORONARY ANGIOGRAM N/A 05/14/2013   Procedure: LEFT AND RIGHT HEART CATHETERIZATION WITH CORONARY ANGIOGRAM;  Surgeon: ThomasTroy Sine [facility]: Moderate Pulm HTN: 46/16 - mean 33 mmHg; PCWP 22mmHg90[name], [name], Patent RCA ostial mid and distal stents - 70% distal runoff Dz   LUMBAR LAMINECTOMY/DECOMPRESSION MICRODISCECTOMY  01/07/2012   Procedure: LUMBAR LAMINECTOMY/DECOMPRESSION MICRODISCECTOMY 1 LEVEL;  Surgeon: Kyle L Winfield CunasLocation: [facility];  Service: Neurosurgery;  Laterality: Bilateral;   Lumbar Three-Four Decompression   LUMBAR LAMINECTOMY/DECOMPRESSION MICRODISCECTOMY N/A 07/26/2020   Procedure: Lumbar two-three Laminectomy/Foraminotomy;  Surgeon: CabbellAshok PallLocation: [facility] OR; [place]: [place]surgery;  [name];   PERCUTANEOUS CORONARY STENT INTERVENTION (PCI-S) N/A 04/28/2012   Procedure: PERCUTANEOUS CORONARY STENT INTERVENTION (PCI-S);  Surgeon: Thomas Troy SineLocation[facility];  Service: Cardiovascular;  [name];   PERCUTANEOUS CORONARY STENT INTERVENTION (PCI-S) N/A 04/29/2012   Procedure: PERCUTANEOUS CORONARY STENT INTERVENTION (PCI-S);  Surgeon: David WLeonie ManLocation[facility];  Service: Cardiovascular;  [name];   PERIPHERAL VASCULAR BALLOON ANGIOPLASTY Right 10/13/2019   Procedure: PERIPHERAL VASCULAR BALLOON ANGIOPLASTY;  Surgeon: Arida, Wellington HampshireLocation[facility];  Service: Cardiovascular;  Laterality: Right;   SPINE [place] REPAIR  2009   steel mesh insert     Social History Social History    Tobacco Use   Smoking status: Former    Packs/day: 1.00    Years: 42.00    Pack years: 42.00    Types: Cigarettes    Quit date: 03/23/2003    Years since quitting: 18.0   Smokeless tobacco: Never  Vaping Use   Vaping Use: Never used  Substance Use Topics   Alcohol use: No    Alcohol/week: 0.0 standard drinks   Drug use: No    Family History Family History  Adopted: Yes  Family history unknown: Yes    Allergies  Allergies  Allergen Reactions   Septra [Bactrim] Itching   Penicillins Hives    Allergy to All cillin drugs  Ancef given 9/24 with no obvious reaction   Metformin And Related Diarrhea and Nausea Only   Other Nausea And Vomiting    General Anesthesia - sometimes causes nausea and vomiting    Sulfamethoxazole-Trimethoprim Other (See Comments)   Wellbutrin [Bupropion]     Other reaction(s): Mood Changes     Current Outpatient Medications  Medication Sig Dispense Refill   allopurinol (ZYLOPRIM) 100 MG tablet  Take 100 mg by mouth 2 (two) times daily.     B-D ULTRAFINE III SHORT PEN 31G X 8 MM MISC Inject 1 each into the skin 3 (three) times daily.     carvedilol (COREG) 12.5 MG tablet TAKE 1 TABLET (12.5 MG TOTAL) BY MOUTH 2 (TWO) TIMES DAILY WITH A MEAL. 180 tablet 3   clopidogrel (PLAVIX) 75 MG tablet TAKE 1 TABLET BY MOUTH EVERY DAY 90 tablet 1   COLCRYS 0.6 MG tablet Take 0.6 mg by mouth daily.     Continuous Blood Gluc Sensor (FREESTYLE LIBRE 14 DAY SENSOR) MISC APPLY 1 SENSOR EVERY 14 DAYS  11   DULoxetine (CYMBALTA) 30 MG capsule duloxetine 30 mg capsule,delayed release  TAKE 1 CAPSULE BY MOUTH EVERY DAY     DULoxetine (CYMBALTA) 60 MG capsule Take 60 mg by mouth daily.     empagliflozin (JARDIANCE) 25 MG TABS tablet Take 25 mg by mouth daily.      escitalopram (LEXAPRO) 20 MG tablet Take 20 mg by mouth daily.     FARXIGA 10 MG TABS tablet Take 10 mg by mouth daily.     furosemide (LASIX) 80 MG tablet Take 160 mg by mouth 3 (three) times daily.      gabapentin (NEURONTIN) 300 MG capsule gabapentin 300 mg capsule  PLEASE SEE ATTACHED FOR DETAILED DIRECTIONS     gabapentin (NEURONTIN) 800 MG tablet Take 1,600 mg by mouth 3 (three) times daily.     insulin regular human CONCENTRATED (HUMULIN R U-500 KWIKPEN) 500 UNIT/ML kwikpen Inject 120 Units into the skin 2 (two) times daily.     ketoconazole (NIZORAL) 2 % cream ketoconazole 2 % topical cream  APPLY TO AFFECTED AREA EVERY DAY     levothyroxine (SYNTHROID, LEVOTHROID) 88 MCG tablet Take 88 mcg by mouth daily before breakfast.     losartan (COZAAR) 25 MG tablet losartan 25 mg tablet  TAKE 1 TABLET BY MOUTH EVERY DAY     losartan (COZAAR) 50 MG tablet Take 50 mg by mouth daily.     mupirocin ointment (BACTROBAN) 2 % APPLY TO AFFECTED AREA TWICE A DAY 22 g 2   nitroGLYCERIN (NITROSTAT) 0.4 MG SL tablet PLACE 1 TAB UNDER THE TONGUE EVERY 5 MINUTES AS NEEDED FOR CHEST PAIN (SEVERE PRESSURE OR TIGHTNESS) (Patient taking differently: Place 0.4 mg under the tongue every 5 (five) minutes as needed for chest pain.) 25 tablet 3   omeprazole (PRILOSEC) 20 MG capsule TAKE 1 CAPSULE BY MOUTH EVERY DAY 90 capsule 3   OZEMPIC, 1 MG/DOSE, 4 MG/3ML SOPN Inject 2 mg as directed every Sunday.     potassium chloride (MICRO-K) 10 MEQ CR capsule Take 10 mEq by mouth daily.   5   rosuvastatin (CRESTOR) 20 MG tablet Take 20 mg by mouth daily.     tiZANidine (ZANAFLEX) 4 MG tablet Take 1 tablet (4 mg total) by mouth every 6 (six) hours as needed for muscle spasms. 60 tablet 0   No current facility-administered medications for this visit.    ROS:   General:  No weight loss, Fever, chills  HEENT: No recent headaches, no nasal bleeding, no visual changes, no sore throat  Neurologic: No dizziness, blackouts, seizures. No recent symptoms of stroke or mini- stroke. No recent episodes of slurred speech, or temporary blindness.  Cardiac: No recent episodes of chest pain/pressure, no shortness of breath at rest.  No  shortness of breath with exertion.  Denies history of atrial fibrillation or irregular heartbeat  Vascular: No history of rest pain in feet.  No history of claudication.  No history of non-healing ulcer, No history of DVT   Pulmonary: No home oxygen, no productive cough, no hemoptysis,  No asthma or wheezing  Musculoskeletal:  _0  Arthritis, [ x] Low back pain,  [ x] Joint pain  Hematologic:No history of hypercoagulable state.  No history of easy bleeding.  No history of anemia  Gastrointestinal: No hematochezia or melena,  No gastroesophageal reflux, no trouble swallowing  Urinary: _1  chronic Kidney disease, _2  on HD - _3  MWF or _4  TTHS, _5  Burning with urination, _6  Frequent urination, _7  Difficulty urinating;   Skin: No rashes  Psychological: No history of anxiety,  No history of depression   Physical Examination  Vitals:   03/22/21 1447 03/22/21 1448  BP: (!) 162/80 (!) 159/77  Pulse: 73   Resp: 20   Temp: 97.6 F (36.4 C)   TempSrc: Temporal   SpO2: 99%   Weight: (!) 330 lb 6.4 oz (149.9 kg)   Height: 6' (1.829 m)     Body mass index is 44.81 kg/m.  General:  Alert and oriented, no acute distress HEENT: Normal Neck: No bruit or JVD Pulmonary: Clear to auscultation bilaterally Cardiac: Regular Rate and Rhythm without murmur Gastrointestinal: Soft, non-tender, non-distended, no mass, no scars Skin: No rash Musculoskeletal: No deformity or edema  Neurologic: Upper and lower extremity motor 5/5 and symmetric  DATA:      Right Carotid Findings:  +----------+--------+--------+--------+------------------+--------+              PSV cm/s EDV cm/s Stenosis Plaque Description Comments   +----------+--------+--------+--------+------------------+--------+   CCA Prox   78       8                                               +----------+--------+--------+--------+------------------+--------+   CCA Mid    74       9                 heterogenous                   +----------+--------+--------+--------+------------------+--------+   CCA Distal 89       11                heterogenous                  +----------+--------+--------+--------+------------------+--------+   ICA Prox   80       14       1-39%    heterogenous                  +----------+--------+--------+--------+------------------+--------+   ICA Mid    87       19                                              +----------+--------+--------+--------+------------------+--------+   ICA Distal 93       22                                              +----------+--------+--------+--------+------------------+--------+  ECA        153      0                 heterogenous                  +----------+--------+--------+--------+------------------+--------+   +----------+--------+-------+----------------+-------------------+              PSV cm/s EDV cms Describe         Arm Pressure (mmHG)   +----------+--------+-------+----------------+-------------------+   Subclavian 165              Multiphasic, WNL                       +----------+--------+-------+----------------+-------------------+   +---------+--------+--+--------+-+---------+   Vertebral PSV cm/s 28 EDV cm/s 6 Antegrade   +---------+--------+--+--------+-+---------+       Left Carotid Findings:  +----------+--------+--------+--------+-------------------------+--------+              PSV cm/s EDV cm/s Stenosis Plaque Description        Comments   +----------+--------+--------+--------+-------------------------+--------+   CCA Prox   80       17                heterogenous                         +----------+--------+--------+--------+-------------------------+--------+   CCA Mid    77       18                heterogenous                         +----------+--------+--------+--------+-------------------------+--------+   CCA Distal 224      47       >50%     heterogenous and calcific             +----------+--------+--------+--------+-------------------------+--------+   ICA Prox   302      56       40-59%   heterogenous and calcific            +----------+--------+--------+--------+-------------------------+--------+   ICA Mid    127      25                                                     +----------+--------+--------+--------+-------------------------+--------+   ICA Distal 93       23                                                     +----------+--------+--------+--------+-------------------------+--------+   ECA        437      35       >50%     heterogenous                         +----------+--------+--------+--------+-------------------------+--------+   +----------+--------+--------+----------------+-------------------+              PSV cm/s EDV cm/s Describe         Arm Pressure (mmHG)   +----------+--------+--------+----------------+-------------------+   Subclavian 218  Multiphasic, WNL                       +----------+--------+--------+----------------+-------------------+   +---------+--------+--+--------+-+---------+   Vertebral PSV cm/s 38 EDV cm/s 6 Antegrade   +---------+--------+--+--------+-+---------+  Summary:  Right Carotid: Velocities in the right ICA are consistent with a 1-39%  stenosis.   Left Carotid: Velocities in the left ICA are consistent with a 40-59%  stenosis.                Hemodynamically significant plaque >50% visualized in the  CCA. The                ECA appears >50% stenosed.   Vertebrals:  Bilateral vertebral arteries demonstrate antegrade flow.  Subclavians: Normal flow hemodynamics were seen in bilateral subclavian               arteries.   ASSESSMENT/PLAN: History of asymptomatic carotid stenosis s/p right CEA in 2005. His duplex is unchanged without re stenosis of the right ICA, 39% and < 59% on the left.  He denies symptoms of stroke/TIA.  He is trying to plan a weight loss goal and exercise that will  not aggravate his spine issues.  Continue maximum medical manage,ment with Plavix and Statin daily.    F/U in 1 year for carotid stenosis surveillance duplex.  He is develops signs or symptoms of stroke he will call 911.       Roxy Horseman PA-C [facility] Office: [phone]  MD in clinic [place]

## 2021-03-26 DIAGNOSIS — I129 Hypertensive chronic kidney disease with stage 1 through stage 4 chronic kidney disease, or unspecified chronic kidney disease: Secondary | ICD-10-CM | POA: Diagnosis not present

## 2021-03-26 DIAGNOSIS — N183 Chronic kidney disease, stage 3 unspecified: Secondary | ICD-10-CM | POA: Diagnosis not present

## 2021-03-27 ENCOUNTER — Ambulatory Visit: Payer: Medicare Other | Admitting: Cardiovascular Disease

## 2021-03-27 ENCOUNTER — Encounter: Payer: Self-pay | Admitting: Cardiovascular Disease

## 2021-03-27 ENCOUNTER — Other Ambulatory Visit: Payer: Self-pay

## 2021-03-27 VITALS — BP 146/66 | HR 68 | Resp 20 | Ht 73.0 in | Wt 327.0 lb

## 2021-03-27 DIAGNOSIS — I2510 Atherosclerotic heart disease of native coronary artery without angina pectoris: Secondary | ICD-10-CM | POA: Diagnosis not present

## 2021-03-27 DIAGNOSIS — I739 Peripheral vascular disease, unspecified: Secondary | ICD-10-CM | POA: Diagnosis not present

## 2021-03-27 DIAGNOSIS — I10 Essential (primary) hypertension: Secondary | ICD-10-CM | POA: Diagnosis not present

## 2021-03-27 DIAGNOSIS — E785 Hyperlipidemia, unspecified: Secondary | ICD-10-CM

## 2021-03-27 NOTE — Progress Notes (Signed)
Cardiology Office Note   Date:  03/30/2021   ID:  Harold [name], DOB [birth year], MRN [id number]  PCP:  Deland Pretty, MD  Cardiologist: Dr. Ellyn Hack.  No chief complaint on file.      History of Present Illness: Harold [name] is a 66 y.o. male who is here today for follow-up visit regarding peripheral arterial disease.    He has known history of carotid disease, obstructive sleep apnea on CPAP, peripheral arterial disease, essential hypertension, obesity, chronic kidney disease, type 2 diabetes and hyperlipidemia.  He has known history of coronary artery disease with previous stenting.  He is not a smoker. He was seen in 2021 for bilateral calf claudication worse on the right. He underwent noninvasive vascular evaluation which showed an ABI of 0.72 on the right and 0.90 on the left. Duplex showed severe disease in the right TP trunk with occluded anterior tibial. On the left, the anterior tibial was occluded. Angiography was performed in August 2021 which showed mild nonobstructive iliac disease.  Imaging of the right lower extremity showed no obstructive disease affecting the SFA or popliteal artery.  There was significant calcified stenosis in the TP trunk with occluded anterior tibial artery and significant disease in the proximal posterior tibial artery which was the dominant vessel below the knee.  I performed successful balloon angioplasty of the right TP trunk into the posterior tibial artery followed by drug-coated balloon angioplasty to the TP trunk. Post procedure vascular studies showed an ABI of 0.9 on the right and 0.99 on the left.  Normal velocities were noted in the right TP trunk and posterior tibial artery.  He has been doing reasonably well overall with no significant claudication.  He does have foot deformity on the left side with bunion and will be undergoing surgery on his left foot in April. He had Doppler studies done in January which showed an ABI of  1.23 on the right and 0.91 on the left.  His toe pressure on the left was 77.  Duplex on the left showed occluded anterior tibial and peroneal artery with patent posterior tibial artery.  Past Medical History:  Diagnosis Date   Anginal pain [facility]) March 2015   Cardiac cath showed patent stents with distal LAD, circumflex-OM and RCA disease in small vessels.   Anxiety    CAD S/P percutaneous coronary angioplasty 2003, 04/2012   status post PCI to LAD, circumflex-OM 2, RCA   Carotid artery occlusion    Chronic renal insufficiency, stage II (mild)    Chronic venous insufficiency    varicosities, no reflux; dopplers 04/14/12- valvular insufficiency in the R and L GSV   Complication of anesthesia    COPD, mild ([place])    Depression    situaltional    Diabetes mellitus    Diabetic neuropathy ([place]) 08/24/2019   Dyslipidemia associated with type 2 diabetes mellitus (HCC)    GERD (gastroesophageal reflux disease)    HTN (hypertension)    Hypothyroidism    Neuromuscular disorder (HCC)    neuropathy in feet   Neuropathy    notably improved following PCI with improved cardiac function   Obesity    Obesity, Class II, BMI 35.0-39.9, with comorbidity (see actual BMI)    BMI 39; wgt loss efforts in place; seeing Dietician   PONV (postoperative nausea and vomiting)    "[employer] [place]"   Pulmonary hypertension ([place]) 04/2012   PA pressure 62mhg   Sleep apnea    uses nightly  Past Surgical History:  Procedure Laterality Date   ABDOMINAL AORTAGRAM N/A 11/15/2011   Procedure: ABDOMINAL AORTAGRAM;  Surgeon: Elam Dutch, MD;  Location: [facility] CATH LAB;  Service: Cardiovascular;  [name];   ABDOMINAL AORTOGRAM W/LOWER EXTREMITY N/A 10/13/2019   Procedure: ABDOMINAL AORTOGRAM W/LOWER EXTREMITY;  Surgeon: Wellington Hampshire, MD;  Location: [place] CV LAB;  Service: Cardiovascular;  [name];   ABIs  04/27/2012   mild bilateral arterial insufficiency   BACK SURGERY  2005 x1   X2-2010    BUNIONECTOMY Right 11/11/2013   Procedure: RIGHT FOOT SILVER BUNIONECTOMY;  Surgeon: Wylene Simmer, MD;  Location: [place];  Service: Orthopedics;  Laterality: Right;   CARDIAC CATHETERIZATION  12/11/2001   significant 3V CAD, normal LV function   Carotid Doppler  04/27/2012   right internal carotid: Elevated velocities but no evidence of plaque. Left internal carotid 40-59%   CAROTID ENDARTERECTOMY  2005   Right; recent carotid Dopplers notes elevated velocities.    colonscopy     CORONARY ANGIOPLASTY  12/21/2001   PTCA of the distal and mid AV groove circ, unsuccessful PTCA of second OM total occlusion, unsuccessful PTCA of the apical LAD total occlusion   CORONARY ANGIOPLASTY WITH STENT PLACEMENT  04/29/12   PCI to 3 RCA lesions, Promus Premiere 2.15mx8mm distally, mid was 2.518m8mm and proximally 2.75x1668mEF 55-60%   CORONARY STENT PLACEMENT  04/28/12   PCI to LAD (3x23m27mence DES postdilated to 3.25) and circ prox and mid (2 overlappinmg 2.5mmx57mmXience DES postdilated to 2.75mm)69mOPPLER ECHOCARDIOGRAPHY  04/28/2012   poor quality study: EF estimated 60-65%; unable to assess diastolic function (previously noted to have diastolic dysfunction); severely dilated left atrium and mild right atrium; dilated IVC consistent with increased central venous pressure..   HEMarland KitchenNIA REPAIR     LEFT AND RIGHT HEART CATHETERIZATION WITH CORONARY ANGIOGRAM N/A 04/27/2012   Procedure: LEFT AND RIGHT HEART CATHETERIZATION WITH CORONARY ANGIOGRAM;  Surgeon: David Leonie Man [facility];  Service: Cardiovascular;  [name];   LEFT AND RIGHT HEART CATHETERIZATION WITH CORONARY ANGIOGRAM N/A 05/14/2013   Procedure: LEFT AND RIGHT HEART CATHETERIZATION WITH CORONARY ANGIOGRAM;  Surgeon: ThomasTroy Sine [facility] Moderate Pulm HTN: 46/16 - mean 33 mmHg; PCWP 22mmHg67[name], Patent Cx stents - distal  small vessel Dz, Patent RCA ostial mid and distal stents - 70% distal runoff Dz   LUMBAR LAMINECTOMY/DECOMPRESSION MICRODISCECTOMY  01/07/2012   Procedure: LUMBAR LAMINECTOMY/DECOMPRESSION MICRODISCECTOMY 1 LEVEL;  Surgeon: Kyle L Winfield CunasLocation: [facility] NEURO ORS;  Service: Neurosurgery;  Laterality: Bilateral;   Lumbar Three-Four Decompression   LUMBAR LAMINECTOMY/DECOMPRESSION MICRODISCECTOMY N/A 07/26/2020   Procedure: Lumbar two-three Laminectomy/Foraminotomy;  Surgeon: CabbellAshok PallLocation: [facility] OR; [place]: [place]surgery;  [name];   PERCUTANEOUS CORONARY STENT INTERVENTION (PCI-S) N/A 04/28/2012   Procedure: PERCUTANEOUS CORONARY STENT INTERVENTION (PCI-S);  Surgeon: Thomas Troy SineLocation[facility];  Service: Cardiovascular;  [name];   PERCUTANEOUS CORONARY STENT INTERVENTION (PCI-S) N/A 04/29/2012   Procedure: PERCUTANEOUS CORONARY STENT INTERVENTION (PCI-S);  Surgeon: David WLeonie Man[facility];  Service: Cardiovascular;  [name];   PERIPHERAL VASCULAR BALLOON ANGIOPLASTY Right 10/13/2019   Procedure: PERIPHERAL VASCULAR BALLOON ANGIOPLASTY;  Surgeon: Adaora Mchaney, Wellington HampshireLocation[facility] [place] [place];  Service: Cardiovascular;  Laterality: Right;   SPINE SURGERY  UMBILICAL HERNIA REPAIR  2009   steel mesh insert     Current Outpatient Medications  Medication Sig Dispense Refill   allopurinol (ZYLOPRIM) 100 MG tablet Take 100 mg by mouth 2 (two) times daily.     B-D ULTRAFINE III SHORT PEN 31G X 8 MM MISC Inject 1 each into the skin 3 (three) times daily.     carvedilol (COREG) 12.5 MG tablet TAKE 1 TABLET (12.5 MG TOTAL) BY MOUTH 2 (TWO) TIMES DAILY WITH A MEAL. 180 tablet 3   clopidogrel (PLAVIX) 75 MG tablet TAKE 1 TABLET BY MOUTH EVERY DAY 90 tablet 1   COLCRYS 0.6 MG tablet Take 0.6 mg by mouth daily.     Continuous Blood Gluc Sensor (FREESTYLE LIBRE 14 DAY SENSOR) MISC APPLY 1 SENSOR EVERY 14 DAYS  11   DULoxetine  (CYMBALTA) 30 MG capsule duloxetine 30 mg capsule,delayed release  TAKE 1 CAPSULE BY MOUTH EVERY DAY     DULoxetine (CYMBALTA) 60 MG capsule Take 60 mg by mouth daily.     empagliflozin (JARDIANCE) 25 MG TABS tablet Take 25 mg by mouth daily.      escitalopram (LEXAPRO) 20 MG tablet Take 20 mg by mouth daily.     FARXIGA 10 MG TABS tablet Take 10 mg by mouth daily.     furosemide (LASIX) 80 MG tablet Take 160 mg by mouth 3 (three) times daily.     gabapentin (NEURONTIN) 300 MG capsule gabapentin 300 mg capsule  PLEASE SEE ATTACHED FOR DETAILED DIRECTIONS     gabapentin (NEURONTIN) 800 MG tablet Take 1,600 mg by mouth 3 (three) times daily.     insulin regular human CONCENTRATED (HUMULIN R U-500 KWIKPEN) 500 UNIT/ML kwikpen Inject 120 Units into the skin 2 (two) times daily.     ketoconazole (NIZORAL) 2 % cream ketoconazole 2 % topical cream  APPLY TO AFFECTED AREA EVERY DAY     levothyroxine (SYNTHROID, LEVOTHROID) 88 MCG tablet Take 88 mcg by mouth daily before breakfast.     losartan (COZAAR) 25 MG tablet losartan 25 mg tablet  TAKE 1 TABLET BY MOUTH EVERY DAY     losartan (COZAAR) 50 MG tablet Take 50 mg by mouth daily.     mupirocin ointment (BACTROBAN) 2 % APPLY TO AFFECTED AREA TWICE A DAY 22 g 2   nitroGLYCERIN (NITROSTAT) 0.4 MG SL tablet PLACE 1 TAB UNDER THE TONGUE EVERY 5 MINUTES AS NEEDED FOR CHEST PAIN (SEVERE PRESSURE OR TIGHTNESS) (Patient taking differently: Place 0.4 mg under the tongue every 5 (five) minutes as needed for chest pain.) 25 tablet 3   omeprazole (PRILOSEC) 20 MG capsule TAKE 1 CAPSULE BY MOUTH EVERY DAY 90 capsule 3   OZEMPIC, 1 MG/DOSE, 4 MG/3ML SOPN Inject 2 mg as directed every Sunday.     potassium chloride (MICRO-K) 10 MEQ CR capsule Take 10 mEq by mouth daily.   5   rosuvastatin (CRESTOR) 20 MG tablet Take 20 mg by mouth daily.     tiZANidine (ZANAFLEX) 4 MG tablet Take 1 tablet (4 mg total) by mouth every 6 (six) hours as needed for muscle spasms. 60  tablet 0   No current facility-administered medications for this visit.    Allergies:   Septra [bactrim], Penicillins, Metformin and related, Other, Sulfamethoxazole-trimethoprim, and Wellbutrin [bupropion]    Social History:  The patient  reports that he quit smoking about 18 years ago. His smoking use included cigarettes. He has a 42.00 pack-year smoking history. He has never used  smokeless tobacco. He reports that he does not drink alcohol and does not use drugs.   Family History:  The patient's He was adopted. Family history is unknown by patient.    ROS:  Please see the history of present illness.   Otherwise, review of systems are positive for none.   All other systems are reviewed and negative.    PHYSICAL EXAM: VS:  BP (!) 146/66 (BP Location: Left Arm, Patient Position: Sitting, Cuff Size: Normal)    Pulse 68    Resp 20    Ht 6' 1" (1.854 m)    Wt (!) 327 lb (148.3 kg)    BMI 43.14 kg/m  , BMI Body mass index is 43.14 kg/m. GEN: Well nourished, well developed, in no acute distress  HEENT: normal  Neck: no JVD, carotid bruits, or masses Cardiac: RRR; no murmurs, rubs, or gallops,no edema  Respiratory:  clear to auscultation bilaterally, normal work of breathing GI: soft, nontender, nondistended, + BS MS: no deformity or atrophy  Skin: warm and dry, no rash Neuro:  Strength and sensation are intact Psych: euthymic mood, full affect Vascular: Femoral pulses +1 bilaterally.     EKG:  EKG is not ordered today.    Recent Labs: 07/24/2020: BUN 38; Potassium 3.5; Sodium 135 07/26/2020: Creatinine, Ser 2.19; Hemoglobin 15.1; Platelets 163    Lipid Panel    Component Value Date/Time   CHOL 178 05/12/2018 1150   TRIG 343 (H) 05/12/2018 1150   HDL 42 05/12/2018 1150   CHOLHDL 4.2 05/12/2018 1150   CHOLHDL 6.0 08/30/2014 1437   VLDL NOT CALC 08/30/2014 1437   LDLCALC 67 05/12/2018 1150      Wt Readings from Last 3 Encounters:  03/27/21 (!) 327 lb (148.3 kg)  03/22/21  (!) 330 lb 6.4 oz (149.9 kg)  10/03/20 (!) 327 lb 3.2 oz (148.4 kg)       No flowsheet data found.    ASSESSMENT AND PLAN:  1. Peripheral arterial disease: Status post angioplasty to the right TP trunk and posterior tibial artery for severe claudication in 2021.   The patient is scheduled for left foot surgery in April.  I agree that he is at high risk for slow healing given his peripheral arterial disease and diabetic status.  Nonetheless, he does seem to have inline flow via the posterior tibial artery.  No indication for revascularization before surgery.  If there are issues with wound healing after the surgery, angiography can be considered at that time.  2. Coronary artery disease involving native coronary arteries without angina: Continue medical therapy.  3. Essential hypertension: Blood pressure is reasonably controlled on current medications.  4. Hyperlipidemia: Currently on rosuvastatin 20 mg daily. Most recent lipid profile showed an LDL of 67 which is at target.    Disposition:   FU with me in 6 months  Signed,  Kathlyn Sacramento, MD  03/30/2021 2:39 PM    [place]

## 2021-03-27 NOTE — Patient Instructions (Signed)
Medication Instructions:  No changes *If you need a refill on your cardiac medications before your next appointment, please call your pharmacy*   Lab Work: None ordered If you have labs (blood work) drawn today and your tests are completely normal, you will receive your results only by: [place] (if you have MyChart) OR A paper copy in the mail If you have any lab test that is abnormal or we need to change your treatment, we will call you to review the results.   Testing/Procedures: None ordered   Follow-Up: At [facility], you and your health needs are our priority.  As part of our continuing mission to provide you with exceptional heart care, we have created designated Provider Care Teams.  These Care Teams include your primary Cardiologist (physician) and Advanced Practice Providers (APPs -  Physician Assistants and Nurse Practitioners) who all work together to provide you with the care you need, when you need it.  We recommend signing up for the patient portal called "MyChart".  Sign up information is provided on this After Visit Summary.  MyChart is used to connect with patients for Virtual Visits (Telemedicine).  Patients are able to view lab/test results, encounter notes, upcoming appointments, etc.  Non-urgent messages can be sent to your provider as well.   To learn more about what you can do with MyChart, go to [email or web address].    Your next appointment:   6 month(s)  The format for your next appointment:   In Person  Provider:   Dr. Fletcher Anon

## 2021-03-30 DIAGNOSIS — E1165 Type 2 diabetes mellitus with hyperglycemia: Secondary | ICD-10-CM | POA: Diagnosis not present

## 2021-03-30 DIAGNOSIS — I10 Essential (primary) hypertension: Secondary | ICD-10-CM | POA: Diagnosis not present

## 2021-03-30 DIAGNOSIS — H9312 Tinnitus, left ear: Secondary | ICD-10-CM | POA: Diagnosis not present

## 2021-03-30 DIAGNOSIS — R2689 Other abnormalities of gait and mobility: Secondary | ICD-10-CM | POA: Diagnosis not present

## 2021-04-03 DIAGNOSIS — Z794 Long term (current) use of insulin: Secondary | ICD-10-CM | POA: Diagnosis not present

## 2021-04-03 DIAGNOSIS — E1140 Type 2 diabetes mellitus with diabetic neuropathy, unspecified: Secondary | ICD-10-CM | POA: Diagnosis not present

## 2021-04-05 DIAGNOSIS — N183 Chronic kidney disease, stage 3 unspecified: Secondary | ICD-10-CM | POA: Diagnosis not present

## 2021-04-19 ENCOUNTER — Other Ambulatory Visit (HOSPITAL_COMMUNITY): Payer: Self-pay

## 2021-04-19 DIAGNOSIS — Z794 Long term (current) use of insulin: Secondary | ICD-10-CM | POA: Diagnosis not present

## 2021-04-19 DIAGNOSIS — E1140 Type 2 diabetes mellitus with diabetic neuropathy, unspecified: Secondary | ICD-10-CM | POA: Diagnosis not present

## 2021-04-19 MED ORDER — MOUNJARO 7.5 MG/0.5ML ~~LOC~~ SOAJ
SUBCUTANEOUS | 0 refills | Status: DC
  Filled 2021-04-19: qty 2, 28d supply, fill #0

## 2021-04-21 ENCOUNTER — Other Ambulatory Visit: Payer: Self-pay | Admitting: Cardiovascular Disease

## 2021-04-21 ENCOUNTER — Other Ambulatory Visit (HOSPITAL_COMMUNITY): Payer: Self-pay

## 2021-04-23 ENCOUNTER — Ambulatory Visit: Payer: Medicare Other | Admitting: Neurology

## 2021-04-23 NOTE — Telephone Encounter (Signed)
Refill request

## 2021-04-25 DIAGNOSIS — N183 Chronic kidney disease, stage 3 unspecified: Secondary | ICD-10-CM | POA: Diagnosis not present

## 2021-04-26 DIAGNOSIS — I951 Orthostatic hypotension: Secondary | ICD-10-CM | POA: Diagnosis not present

## 2021-04-26 DIAGNOSIS — E039 Hypothyroidism, unspecified: Secondary | ICD-10-CM | POA: Diagnosis not present

## 2021-04-26 DIAGNOSIS — E785 Hyperlipidemia, unspecified: Secondary | ICD-10-CM | POA: Diagnosis not present

## 2021-04-26 DIAGNOSIS — Z794 Long term (current) use of insulin: Secondary | ICD-10-CM | POA: Diagnosis not present

## 2021-04-26 DIAGNOSIS — E1140 Type 2 diabetes mellitus with diabetic neuropathy, unspecified: Secondary | ICD-10-CM | POA: Diagnosis not present

## 2021-04-26 DIAGNOSIS — I10 Essential (primary) hypertension: Secondary | ICD-10-CM | POA: Diagnosis not present

## 2021-05-03 DIAGNOSIS — E1140 Type 2 diabetes mellitus with diabetic neuropathy, unspecified: Secondary | ICD-10-CM | POA: Diagnosis not present

## 2021-05-03 DIAGNOSIS — I10 Essential (primary) hypertension: Secondary | ICD-10-CM | POA: Diagnosis not present

## 2021-05-03 DIAGNOSIS — E039 Hypothyroidism, unspecified: Secondary | ICD-10-CM | POA: Diagnosis not present

## 2021-05-03 DIAGNOSIS — E1165 Type 2 diabetes mellitus with hyperglycemia: Secondary | ICD-10-CM | POA: Diagnosis not present

## 2021-05-03 DIAGNOSIS — E785 Hyperlipidemia, unspecified: Secondary | ICD-10-CM | POA: Diagnosis not present

## 2021-05-03 DIAGNOSIS — Z794 Long term (current) use of insulin: Secondary | ICD-10-CM | POA: Diagnosis not present

## 2021-05-03 DIAGNOSIS — I951 Orthostatic hypotension: Secondary | ICD-10-CM | POA: Diagnosis not present

## 2021-05-08 ENCOUNTER — Telehealth: Payer: Self-pay

## 2021-05-08 NOTE — Telephone Encounter (Signed)
Sounds good thank you

## 2021-05-08 NOTE — Telephone Encounter (Signed)
Harold [name] called to cancel his surgery with Dr. Sherryle Lis on 06/15/2021. He stated he knows that his A1c will not be where it needs to be before surgery. He stated he will talk to Dr[facility]. Notified Dr. Sherryle Lis ?

## 2021-05-16 ENCOUNTER — Other Ambulatory Visit (HOSPITAL_COMMUNITY): Payer: Self-pay

## 2021-05-16 DIAGNOSIS — I10 Essential (primary) hypertension: Secondary | ICD-10-CM | POA: Diagnosis not present

## 2021-05-16 DIAGNOSIS — E1140 Type 2 diabetes mellitus with diabetic neuropathy, unspecified: Secondary | ICD-10-CM | POA: Diagnosis not present

## 2021-05-16 DIAGNOSIS — E1165 Type 2 diabetes mellitus with hyperglycemia: Secondary | ICD-10-CM | POA: Diagnosis not present

## 2021-05-16 DIAGNOSIS — I951 Orthostatic hypotension: Secondary | ICD-10-CM | POA: Diagnosis not present

## 2021-05-16 DIAGNOSIS — E039 Hypothyroidism, unspecified: Secondary | ICD-10-CM | POA: Diagnosis not present

## 2021-05-16 DIAGNOSIS — Z794 Long term (current) use of insulin: Secondary | ICD-10-CM | POA: Diagnosis not present

## 2021-05-16 DIAGNOSIS — E785 Hyperlipidemia, unspecified: Secondary | ICD-10-CM | POA: Diagnosis not present

## 2021-05-16 MED ORDER — MOUNJARO 12.5 MG/0.5ML ~~LOC~~ SOAJ
SUBCUTANEOUS | 0 refills | Status: DC
  Filled 2021-05-16 – 2021-05-21 (×2): qty 2, 28d supply, fill #0

## 2021-05-18 ENCOUNTER — Other Ambulatory Visit (HOSPITAL_COMMUNITY): Payer: Self-pay

## 2021-05-18 DIAGNOSIS — E1140 Type 2 diabetes mellitus with diabetic neuropathy, unspecified: Secondary | ICD-10-CM | POA: Diagnosis not present

## 2021-05-18 DIAGNOSIS — E1165 Type 2 diabetes mellitus with hyperglycemia: Secondary | ICD-10-CM | POA: Diagnosis not present

## 2021-05-18 DIAGNOSIS — I10 Essential (primary) hypertension: Secondary | ICD-10-CM | POA: Diagnosis not present

## 2021-05-21 ENCOUNTER — Other Ambulatory Visit (HOSPITAL_COMMUNITY): Payer: Self-pay

## 2021-05-28 ENCOUNTER — Ambulatory Visit (INDEPENDENT_AMBULATORY_CARE_PROVIDER_SITE_OTHER): Payer: Medicare Other | Admitting: Podiatry

## 2021-05-28 DIAGNOSIS — M79675 Pain in left toe(s): Secondary | ICD-10-CM

## 2021-05-28 DIAGNOSIS — I739 Peripheral vascular disease, unspecified: Secondary | ICD-10-CM | POA: Diagnosis not present

## 2021-05-28 DIAGNOSIS — M79674 Pain in right toe(s): Secondary | ICD-10-CM | POA: Diagnosis not present

## 2021-05-28 DIAGNOSIS — E1149 Type 2 diabetes mellitus with other diabetic neurological complication: Secondary | ICD-10-CM | POA: Diagnosis not present

## 2021-05-28 DIAGNOSIS — L84 Corns and callosities: Secondary | ICD-10-CM

## 2021-05-28 DIAGNOSIS — B351 Tinea unguium: Secondary | ICD-10-CM

## 2021-05-28 NOTE — Patient Instructions (Signed)
Moisturize feet once daily; do not apply between toes: ?A.  Aquaphor Healing Ointment ?B.  Vaseline Intensive Care Lotion ?C.  Lubriderm Lotion ?D.  [place] Diabetic Foot Lotion ?E.  Eucerin Intensive Repair Moisturizing Lotion ? ?If you have problems reaching your feet: ? ?A.  Aquaphor Advanced Therapy Ointment Body Spray ?B.  Vaseline Intensive Care Spray Lotion Advanced Repair ? ? ? ?

## 2021-05-29 NOTE — Progress Notes (Signed)
?  Subjective:  ?Patient ID: Harold [name], male    DOB: December 26, 1955,  MRN: [id number] ? ?Chief Complaint  ?Patient presents with  ? Nail Problem  ?  Thick painful toenails, 3 month follow up   ? ? ?66 y.o. male returns with the above complaint. History confirmed with patient.  Overall doing well no new issues or no new ulcerations.  Has not had his A1c checked yet but he is about to have steroid shots in his knees.  We have postponed his surgery for now ? ?Objective:  ?Physical Exam: ?no trophic changes or ulcerative lesions, reduced sensation at plantar pulps of toes and feet, large spider veins, varicosities and torturous veins in the lower legs along with venous stasis dermatitis noted and +1 PT and DP pulses bilaterally, both feet are warm and well-perfused.  Dry skin and fissuring plantar heel bilateral.  No ulcerations ?Left Foot: Moderate to severe hallux valgus with semirigid hammertoes present, mycotic nails x5,   Callus on medial hallux ?Right Foot: Mild to moderate hallux valgus with semirigid hammertoes present, mycotic nails since 5.  There is a callus on the medial hallux. right dorsal fifth PIPJ ulcer has healed with overlying hyperkeratosis.  ? ? ? ? ? ?Assessment:  ? ?1. Pain due to onychomycosis of toenails of both feet   ?2. Corns and callus   ?3. Diabetes mellitus type 2 with neurological manifestations ([place])   ?4. PAD (peripheral artery disease) ([place])   ? ? ? ? ? ?Plan:  ?Patient was evaluated and treated and all questions answered. ? ?Patient educated on diabetes. Discussed proper diabetic foot care and discussed risks and complications of disease. Educated patient in depth on reasons to return to the office immediately should he/she discover anything concerning or new on the feet. All questions answered. Discussed proper shoes as well.  ? ?All symptomatic hyperkeratoses were safely debrided with a sterile #15 blade to patient's level of comfort without incident. We discussed preventative  and palliative care of these lesions including supportive and accommodative shoegear, padding, prefabricated and custom molded accommodative orthoses, use of a pumice stone and lotions/creams daily. ? ?Discussed the etiology and treatment options for the condition in detail with the patient. Educated patient on the topical and oral treatment options for mycotic nails. Recommended debridement of the nails today. Sharp and mechanical debridement performed of all painful and mycotic nails today. Nails debrided in length and thickness using a nail nipper to level of comfort. Discussed treatment options including appropriate shoe gear. Follow up as needed for painful nails. ? ? ? ? ?Return in about 12 weeks (around 08/20/2021) for at risk diabetic foot care.  ? ?

## 2021-05-31 ENCOUNTER — Other Ambulatory Visit (HOSPITAL_COMMUNITY): Payer: Self-pay

## 2021-06-12 DIAGNOSIS — M7062 Trochanteric bursitis, left hip: Secondary | ICD-10-CM | POA: Diagnosis not present

## 2021-06-12 DIAGNOSIS — M7061 Trochanteric bursitis, right hip: Secondary | ICD-10-CM | POA: Diagnosis not present

## 2021-06-14 ENCOUNTER — Other Ambulatory Visit (HOSPITAL_COMMUNITY): Payer: Self-pay

## 2021-06-14 DIAGNOSIS — I10 Essential (primary) hypertension: Secondary | ICD-10-CM | POA: Diagnosis not present

## 2021-06-14 DIAGNOSIS — E1140 Type 2 diabetes mellitus with diabetic neuropathy, unspecified: Secondary | ICD-10-CM | POA: Diagnosis not present

## 2021-06-14 DIAGNOSIS — E039 Hypothyroidism, unspecified: Secondary | ICD-10-CM | POA: Diagnosis not present

## 2021-06-14 DIAGNOSIS — E785 Hyperlipidemia, unspecified: Secondary | ICD-10-CM | POA: Diagnosis not present

## 2021-06-14 DIAGNOSIS — I951 Orthostatic hypotension: Secondary | ICD-10-CM | POA: Diagnosis not present

## 2021-06-14 DIAGNOSIS — E1165 Type 2 diabetes mellitus with hyperglycemia: Secondary | ICD-10-CM | POA: Diagnosis not present

## 2021-06-14 DIAGNOSIS — Z794 Long term (current) use of insulin: Secondary | ICD-10-CM | POA: Diagnosis not present

## 2021-06-14 MED ORDER — MOUNJARO 12.5 MG/0.5ML ~~LOC~~ SOAJ
SUBCUTANEOUS | 1 refills | Status: DC
  Filled 2021-06-14 – 2021-08-03 (×3): qty 2, 28d supply, fill #0

## 2021-06-15 ENCOUNTER — Ambulatory Visit (HOSPITAL_BASED_OUTPATIENT_CLINIC_OR_DEPARTMENT_OTHER): Admit: 2021-06-15 | Payer: Medicare Other | Admitting: Podiatry

## 2021-06-15 ENCOUNTER — Encounter (HOSPITAL_BASED_OUTPATIENT_CLINIC_OR_DEPARTMENT_OTHER): Payer: Self-pay

## 2021-06-15 SURGERY — OSTEOTOMY, METATARSAL BONE
Anesthesia: Choice | Site: Toe | Laterality: Left

## 2021-06-21 ENCOUNTER — Encounter: Payer: Medicare Other | Admitting: Podiatry

## 2021-06-22 ENCOUNTER — Other Ambulatory Visit (HOSPITAL_COMMUNITY): Payer: Self-pay

## 2021-06-26 ENCOUNTER — Other Ambulatory Visit (HOSPITAL_COMMUNITY): Payer: Self-pay

## 2021-06-27 DIAGNOSIS — M25551 Pain in right hip: Secondary | ICD-10-CM | POA: Diagnosis not present

## 2021-06-28 ENCOUNTER — Encounter: Payer: Medicare Other | Admitting: Podiatry

## 2021-07-10 DIAGNOSIS — M7062 Trochanteric bursitis, left hip: Secondary | ICD-10-CM | POA: Diagnosis not present

## 2021-07-10 DIAGNOSIS — M7061 Trochanteric bursitis, right hip: Secondary | ICD-10-CM | POA: Diagnosis not present

## 2021-07-10 DIAGNOSIS — M1611 Unilateral primary osteoarthritis, right hip: Secondary | ICD-10-CM | POA: Diagnosis not present

## 2021-07-12 ENCOUNTER — Encounter: Payer: Medicare Other | Admitting: Podiatry

## 2021-07-15 ENCOUNTER — Telehealth: Payer: Self-pay | Admitting: Cardiovascular Disease

## 2021-07-17 DIAGNOSIS — E1140 Type 2 diabetes mellitus with diabetic neuropathy, unspecified: Secondary | ICD-10-CM | POA: Diagnosis not present

## 2021-07-17 DIAGNOSIS — E1165 Type 2 diabetes mellitus with hyperglycemia: Secondary | ICD-10-CM | POA: Diagnosis not present

## 2021-07-17 DIAGNOSIS — E785 Hyperlipidemia, unspecified: Secondary | ICD-10-CM | POA: Diagnosis not present

## 2021-07-17 DIAGNOSIS — I10 Essential (primary) hypertension: Secondary | ICD-10-CM | POA: Diagnosis not present

## 2021-07-17 DIAGNOSIS — E039 Hypothyroidism, unspecified: Secondary | ICD-10-CM | POA: Diagnosis not present

## 2021-07-17 DIAGNOSIS — Z794 Long term (current) use of insulin: Secondary | ICD-10-CM | POA: Diagnosis not present

## 2021-07-17 NOTE — Telephone Encounter (Signed)
Reviewed for plavix refill.   Patient seen in office on 03/27/21 with Dr. Fletcher Anon.   Per 10/13/19 PV procedure with Dr. Fletcher Anon: 1.  Mild nonobstructive iliac disease 2.  Right lower extremity: No obstructive disease affecting the SFA and popliteal artery, significant calcified stenosis in the TP trunk, occluded proximal anterior tibial artery and significant disease in the proximal posterior tibial artery which is the dominant vessel below the knee. 3.  Successful balloon angioplasty of the right TP trunk into the posterior tibial artery followed by drug-coated balloon angioplasty of the TP trunk.   Recommendations: I will switch him from low-dose Xarelto to clopidogrel to be used for a few months and then we can switch back to low-dose Xarelto. Continue aggressive treatment of risk factors. Left lower extremity arterial angiography was not performed during the session due to chronic kidney disease.  I do not think he will require intervention on that side but we will see how he responds to today's treatment.   To Dr. Olga Coaster, RN for verification of plavix refill.

## 2021-07-17 NOTE — Telephone Encounter (Signed)
Ok to refill Plavix.

## 2021-07-23 DIAGNOSIS — M25551 Pain in right hip: Secondary | ICD-10-CM | POA: Diagnosis not present

## 2021-07-25 DIAGNOSIS — G4733 Obstructive sleep apnea (adult) (pediatric): Secondary | ICD-10-CM | POA: Diagnosis not present

## 2021-08-03 ENCOUNTER — Other Ambulatory Visit (HOSPITAL_COMMUNITY): Payer: Self-pay

## 2021-08-06 ENCOUNTER — Other Ambulatory Visit (HOSPITAL_COMMUNITY): Payer: Self-pay

## 2021-08-07 ENCOUNTER — Other Ambulatory Visit (HOSPITAL_COMMUNITY): Payer: Self-pay

## 2021-08-07 DIAGNOSIS — H903 Sensorineural hearing loss, bilateral: Secondary | ICD-10-CM | POA: Diagnosis not present

## 2021-08-07 DIAGNOSIS — H9313 Tinnitus, bilateral: Secondary | ICD-10-CM | POA: Diagnosis not present

## 2021-08-07 MED ORDER — MOUNJARO 15 MG/0.5ML ~~LOC~~ SOAJ
SUBCUTANEOUS | 3 refills | Status: DC
  Filled 2021-08-07: qty 2, 28d supply, fill #0
  Filled 2021-09-02 – 2021-10-03 (×2): qty 2, 28d supply, fill #1
  Filled 2021-11-02: qty 2, 28d supply, fill #2
  Filled 2021-12-03: qty 2, 28d supply, fill #3

## 2021-08-07 MED ORDER — MOUNJARO 12.5 MG/0.5ML ~~LOC~~ SOAJ
SUBCUTANEOUS | 2 refills | Status: DC
  Filled 2021-08-07 – 2021-09-06 (×2): qty 2, 28d supply, fill #0

## 2021-08-16 ENCOUNTER — Ambulatory Visit (INDEPENDENT_AMBULATORY_CARE_PROVIDER_SITE_OTHER): Payer: Medicare Other | Admitting: Podiatry

## 2021-08-16 DIAGNOSIS — M79674 Pain in right toe(s): Secondary | ICD-10-CM | POA: Diagnosis not present

## 2021-08-16 DIAGNOSIS — B351 Tinea unguium: Secondary | ICD-10-CM | POA: Diagnosis not present

## 2021-08-16 DIAGNOSIS — I739 Peripheral vascular disease, unspecified: Secondary | ICD-10-CM | POA: Diagnosis not present

## 2021-08-16 DIAGNOSIS — L84 Corns and callosities: Secondary | ICD-10-CM | POA: Diagnosis not present

## 2021-08-16 DIAGNOSIS — M79675 Pain in left toe(s): Secondary | ICD-10-CM | POA: Diagnosis not present

## 2021-08-16 DIAGNOSIS — E1149 Type 2 diabetes mellitus with other diabetic neurological complication: Secondary | ICD-10-CM

## 2021-08-19 NOTE — Progress Notes (Signed)
  Subjective:  Patient ID: Harold [name], male    DOB: 1955/04/04,  MRN: [id number]  Chief Complaint  Patient presents with   Nail Problem     12 weeks (around 08/20/2021) for at risk diabetic foot care    66 y.o. male returns with the above complaint. History confirmed with patient.  Overall doing okay he has been using the lotion.  Still working on his sugar and A1c  Objective:  Physical Exam: no trophic changes or ulcerative lesions, reduced sensation at plantar pulps of toes and feet, large spider veins, varicosities and torturous veins in the lower legs along with venous stasis dermatitis noted and +1 PT and DP pulses bilaterally, both feet are warm and well-perfused.  Dry skin and fissuring plantar heel bilateral.  No ulcerations Left Foot: Moderate to severe hallux valgus with semirigid hammertoes present, mycotic nails x5,   Callus on medial hallux Right Foot: Mild to moderate hallux valgus with semirigid hammertoes present, mycotic nails since 5.  There is a callus on the medial hallux. right dorsal fifth PIPJ ulcer has healed with overlying hyperkeratosis.       Assessment:   1. Pain due to onychomycosis of toenails of both feet   2. Corns and callus   3. Diabetes mellitus type 2 with neurological manifestations ([place])   4. PAD (peripheral artery disease) ([place])        Plan:  Patient was evaluated and treated and all questions answered.  Patient educated on diabetes. Discussed proper diabetic foot care and discussed risks and complications of disease. Educated patient in depth on reasons to return to the office immediately should he/she discover anything concerning or new on the feet. All questions answered. Discussed proper shoes as well.   All symptomatic hyperkeratoses were safely debrided with a sterile #15 blade to patient's level of comfort without incident. We discussed preventative and palliative care of these lesions including supportive and accommodative  shoegear, padding, prefabricated and custom molded accommodative orthoses, use of a pumice stone and lotions/creams daily.  Discussed the etiology and treatment options for the condition in detail with the patient. Educated patient on the topical and oral treatment options for mycotic nails. Recommended debridement of the nails today. Sharp and mechanical debridement performed of all painful and mycotic nails today. Nails debrided in length and thickness using a nail nipper to level of comfort. Discussed treatment options including appropriate shoe gear. Follow up as needed for painful nails.     Return in about 3 months (around 11/16/2021) for at risk diabetic foot care.

## 2021-08-23 DIAGNOSIS — M79631 Pain in right forearm: Secondary | ICD-10-CM | POA: Diagnosis not present

## 2021-08-23 DIAGNOSIS — M25521 Pain in right elbow: Secondary | ICD-10-CM | POA: Diagnosis not present

## 2021-08-27 DIAGNOSIS — N183 Chronic kidney disease, stage 3 unspecified: Secondary | ICD-10-CM | POA: Diagnosis not present

## 2021-08-28 DIAGNOSIS — E1140 Type 2 diabetes mellitus with diabetic neuropathy, unspecified: Secondary | ICD-10-CM | POA: Diagnosis not present

## 2021-08-28 DIAGNOSIS — E039 Hypothyroidism, unspecified: Secondary | ICD-10-CM | POA: Diagnosis not present

## 2021-08-28 DIAGNOSIS — I10 Essential (primary) hypertension: Secondary | ICD-10-CM | POA: Diagnosis not present

## 2021-08-28 DIAGNOSIS — E1165 Type 2 diabetes mellitus with hyperglycemia: Secondary | ICD-10-CM | POA: Diagnosis not present

## 2021-08-28 DIAGNOSIS — E785 Hyperlipidemia, unspecified: Secondary | ICD-10-CM | POA: Diagnosis not present

## 2021-08-28 DIAGNOSIS — Z794 Long term (current) use of insulin: Secondary | ICD-10-CM | POA: Diagnosis not present

## 2021-08-29 DIAGNOSIS — M25521 Pain in right elbow: Secondary | ICD-10-CM | POA: Diagnosis not present

## 2021-09-03 ENCOUNTER — Other Ambulatory Visit (HOSPITAL_COMMUNITY): Payer: Self-pay

## 2021-09-03 DIAGNOSIS — M79631 Pain in right forearm: Secondary | ICD-10-CM | POA: Diagnosis not present

## 2021-09-03 DIAGNOSIS — M25521 Pain in right elbow: Secondary | ICD-10-CM | POA: Diagnosis not present

## 2021-09-03 DIAGNOSIS — E1165 Type 2 diabetes mellitus with hyperglycemia: Secondary | ICD-10-CM | POA: Diagnosis not present

## 2021-09-03 DIAGNOSIS — E119 Type 2 diabetes mellitus without complications: Secondary | ICD-10-CM | POA: Diagnosis not present

## 2021-09-03 DIAGNOSIS — E1140 Type 2 diabetes mellitus with diabetic neuropathy, unspecified: Secondary | ICD-10-CM | POA: Diagnosis not present

## 2021-09-03 DIAGNOSIS — S46291A Other injury of muscle, fascia and tendon of other parts of biceps, right arm, initial encounter: Secondary | ICD-10-CM | POA: Diagnosis not present

## 2021-09-06 ENCOUNTER — Other Ambulatory Visit (HOSPITAL_COMMUNITY): Payer: Self-pay

## 2021-09-06 DIAGNOSIS — E039 Hypothyroidism, unspecified: Secondary | ICD-10-CM | POA: Diagnosis not present

## 2021-09-06 DIAGNOSIS — I10 Essential (primary) hypertension: Secondary | ICD-10-CM | POA: Diagnosis not present

## 2021-09-06 DIAGNOSIS — E785 Hyperlipidemia, unspecified: Secondary | ICD-10-CM | POA: Diagnosis not present

## 2021-09-06 DIAGNOSIS — E1165 Type 2 diabetes mellitus with hyperglycemia: Secondary | ICD-10-CM | POA: Diagnosis not present

## 2021-09-11 DIAGNOSIS — I272 Pulmonary hypertension, unspecified: Secondary | ICD-10-CM | POA: Diagnosis not present

## 2021-09-11 DIAGNOSIS — N1832 Chronic kidney disease, stage 3b: Secondary | ICD-10-CM | POA: Diagnosis not present

## 2021-09-11 DIAGNOSIS — I739 Peripheral vascular disease, unspecified: Secondary | ICD-10-CM | POA: Diagnosis not present

## 2021-09-11 DIAGNOSIS — D692 Other nonthrombocytopenic purpura: Secondary | ICD-10-CM | POA: Diagnosis not present

## 2021-09-11 DIAGNOSIS — Z0000 Encounter for general adult medical examination without abnormal findings: Secondary | ICD-10-CM | POA: Diagnosis not present

## 2021-09-11 DIAGNOSIS — M109 Gout, unspecified: Secondary | ICD-10-CM | POA: Diagnosis not present

## 2021-09-11 DIAGNOSIS — I2510 Atherosclerotic heart disease of native coronary artery without angina pectoris: Secondary | ICD-10-CM | POA: Diagnosis not present

## 2021-09-11 DIAGNOSIS — E1140 Type 2 diabetes mellitus with diabetic neuropathy, unspecified: Secondary | ICD-10-CM | POA: Diagnosis not present

## 2021-09-11 DIAGNOSIS — I10 Essential (primary) hypertension: Secondary | ICD-10-CM | POA: Diagnosis not present

## 2021-09-11 DIAGNOSIS — E039 Hypothyroidism, unspecified: Secondary | ICD-10-CM | POA: Diagnosis not present

## 2021-09-18 DIAGNOSIS — I10 Essential (primary) hypertension: Secondary | ICD-10-CM | POA: Diagnosis not present

## 2021-09-18 DIAGNOSIS — N1832 Chronic kidney disease, stage 3b: Secondary | ICD-10-CM | POA: Diagnosis not present

## 2021-09-18 DIAGNOSIS — I951 Orthostatic hypotension: Secondary | ICD-10-CM | POA: Diagnosis not present

## 2021-09-18 DIAGNOSIS — E785 Hyperlipidemia, unspecified: Secondary | ICD-10-CM | POA: Diagnosis not present

## 2021-09-18 DIAGNOSIS — E039 Hypothyroidism, unspecified: Secondary | ICD-10-CM | POA: Diagnosis not present

## 2021-09-18 DIAGNOSIS — E1165 Type 2 diabetes mellitus with hyperglycemia: Secondary | ICD-10-CM | POA: Diagnosis not present

## 2021-09-25 DIAGNOSIS — I951 Orthostatic hypotension: Secondary | ICD-10-CM | POA: Diagnosis not present

## 2021-10-01 DIAGNOSIS — S46291A Other injury of muscle, fascia and tendon of other parts of biceps, right arm, initial encounter: Secondary | ICD-10-CM | POA: Diagnosis not present

## 2021-10-01 DIAGNOSIS — M25521 Pain in right elbow: Secondary | ICD-10-CM | POA: Diagnosis not present

## 2021-10-01 DIAGNOSIS — E119 Type 2 diabetes mellitus without complications: Secondary | ICD-10-CM | POA: Diagnosis not present

## 2021-10-01 DIAGNOSIS — M79631 Pain in right forearm: Secondary | ICD-10-CM | POA: Diagnosis not present

## 2021-10-03 ENCOUNTER — Other Ambulatory Visit (HOSPITAL_COMMUNITY): Payer: Self-pay

## 2021-10-04 DIAGNOSIS — E1165 Type 2 diabetes mellitus with hyperglycemia: Secondary | ICD-10-CM | POA: Diagnosis not present

## 2021-10-04 DIAGNOSIS — E1140 Type 2 diabetes mellitus with diabetic neuropathy, unspecified: Secondary | ICD-10-CM | POA: Diagnosis not present

## 2021-10-09 DIAGNOSIS — R21 Rash and other nonspecific skin eruption: Secondary | ICD-10-CM | POA: Diagnosis not present

## 2021-10-30 DIAGNOSIS — E1140 Type 2 diabetes mellitus with diabetic neuropathy, unspecified: Secondary | ICD-10-CM | POA: Diagnosis not present

## 2021-10-30 DIAGNOSIS — I10 Essential (primary) hypertension: Secondary | ICD-10-CM | POA: Diagnosis not present

## 2021-10-30 DIAGNOSIS — Z794 Long term (current) use of insulin: Secondary | ICD-10-CM | POA: Diagnosis not present

## 2021-10-30 DIAGNOSIS — E1165 Type 2 diabetes mellitus with hyperglycemia: Secondary | ICD-10-CM | POA: Diagnosis not present

## 2021-10-30 DIAGNOSIS — E785 Hyperlipidemia, unspecified: Secondary | ICD-10-CM | POA: Diagnosis not present

## 2021-10-30 DIAGNOSIS — E039 Hypothyroidism, unspecified: Secondary | ICD-10-CM | POA: Diagnosis not present

## 2021-11-02 ENCOUNTER — Other Ambulatory Visit (HOSPITAL_COMMUNITY): Payer: Self-pay

## 2021-11-04 DIAGNOSIS — E1140 Type 2 diabetes mellitus with diabetic neuropathy, unspecified: Secondary | ICD-10-CM | POA: Diagnosis not present

## 2021-11-04 DIAGNOSIS — E1165 Type 2 diabetes mellitus with hyperglycemia: Secondary | ICD-10-CM | POA: Diagnosis not present

## 2021-11-05 ENCOUNTER — Other Ambulatory Visit (HOSPITAL_COMMUNITY): Payer: Self-pay

## 2021-11-06 ENCOUNTER — Other Ambulatory Visit (HOSPITAL_COMMUNITY): Payer: Self-pay

## 2021-11-12 ENCOUNTER — Other Ambulatory Visit: Payer: Self-pay | Admitting: Cardiovascular Disease

## 2021-11-20 ENCOUNTER — Ambulatory Visit: Payer: Medicare Other | Admitting: Podiatry

## 2021-11-29 DIAGNOSIS — Z794 Long term (current) use of insulin: Secondary | ICD-10-CM | POA: Diagnosis not present

## 2021-11-29 DIAGNOSIS — H52203 Unspecified astigmatism, bilateral: Secondary | ICD-10-CM | POA: Diagnosis not present

## 2021-11-29 DIAGNOSIS — Z961 Presence of intraocular lens: Secondary | ICD-10-CM | POA: Diagnosis not present

## 2021-11-29 DIAGNOSIS — Z7985 Long-term (current) use of injectable non-insulin antidiabetic drugs: Secondary | ICD-10-CM | POA: Diagnosis not present

## 2021-11-29 DIAGNOSIS — H524 Presbyopia: Secondary | ICD-10-CM | POA: Diagnosis not present

## 2021-11-29 DIAGNOSIS — E119 Type 2 diabetes mellitus without complications: Secondary | ICD-10-CM | POA: Diagnosis not present

## 2021-12-03 ENCOUNTER — Other Ambulatory Visit (HOSPITAL_COMMUNITY): Payer: Self-pay

## 2021-12-04 DIAGNOSIS — E1140 Type 2 diabetes mellitus with diabetic neuropathy, unspecified: Secondary | ICD-10-CM | POA: Diagnosis not present

## 2021-12-04 DIAGNOSIS — E1165 Type 2 diabetes mellitus with hyperglycemia: Secondary | ICD-10-CM | POA: Diagnosis not present

## 2021-12-05 DIAGNOSIS — I10 Essential (primary) hypertension: Secondary | ICD-10-CM | POA: Diagnosis not present

## 2021-12-05 DIAGNOSIS — Z794 Long term (current) use of insulin: Secondary | ICD-10-CM | POA: Diagnosis not present

## 2021-12-05 DIAGNOSIS — E1165 Type 2 diabetes mellitus with hyperglycemia: Secondary | ICD-10-CM | POA: Diagnosis not present

## 2021-12-06 ENCOUNTER — Other Ambulatory Visit (HOSPITAL_COMMUNITY): Payer: Self-pay

## 2021-12-06 ENCOUNTER — Ambulatory Visit (INDEPENDENT_AMBULATORY_CARE_PROVIDER_SITE_OTHER): Payer: Medicare Other | Admitting: Podiatry

## 2021-12-06 DIAGNOSIS — E1149 Type 2 diabetes mellitus with other diabetic neurological complication: Secondary | ICD-10-CM

## 2021-12-06 DIAGNOSIS — M79675 Pain in left toe(s): Secondary | ICD-10-CM

## 2021-12-06 DIAGNOSIS — M79674 Pain in right toe(s): Secondary | ICD-10-CM

## 2021-12-06 DIAGNOSIS — B351 Tinea unguium: Secondary | ICD-10-CM | POA: Diagnosis not present

## 2021-12-06 DIAGNOSIS — I739 Peripheral vascular disease, unspecified: Secondary | ICD-10-CM

## 2021-12-06 DIAGNOSIS — L84 Corns and callosities: Secondary | ICD-10-CM | POA: Diagnosis not present

## 2021-12-06 MED ORDER — MOUNJARO 15 MG/0.5ML ~~LOC~~ SOAJ
15.0000 mg | SUBCUTANEOUS | 3 refills | Status: DC
  Filled 2021-12-06: qty 6, 84d supply, fill #0
  Filled 2021-12-31: qty 2, 28d supply, fill #0
  Filled 2022-02-02 – 2022-02-05 (×2): qty 2, 28d supply, fill #1
  Filled 2022-03-04: qty 2, 28d supply, fill #2
  Filled 2022-04-07 – 2022-07-01 (×3): qty 2, 28d supply, fill #3
  Filled 2022-10-07: qty 2, 28d supply, fill #4
  Filled 2022-11-08: qty 2, 28d supply, fill #5
  Filled 2022-12-02: qty 2, 28d supply, fill #6

## 2021-12-08 NOTE — Progress Notes (Signed)
  Subjective:  Patient ID: Harold [name], male    DOB: 03/07/1955,  MRN: [id number]  Chief Complaint  Patient presents with   foot care    Patient is here for diabetic foot care.    66 y.o. male returns with the above complaint. History confirmed with patient.  Nails and calluses are thickened again.  He has decided he would like to hold off on any consideration for surgery  Objective:  Physical Exam: no trophic changes or ulcerative lesions, reduced sensation at plantar pulps of toes and feet, large spider veins, varicosities and torturous veins in the lower legs along with venous stasis dermatitis noted and +1 PT and DP pulses bilaterally, both feet are warm and well-perfused.  Dry skin and fissuring plantar heel bilateral.  No ulcerations Left Foot: Moderate to severe hallux valgus with semirigid hammertoes present, mycotic nails x5,   Callus on medial hallux Right Foot: Mild to moderate hallux valgus with semirigid hammertoes present, mycotic nails since 5.  There is a callus on the medial hallux. right dorsal fifth PIPJ ulcer has healed with overlying hyperkeratosis.       Assessment:   1. PAD (peripheral artery disease) ([place])   2. Corns and callus   3. Pain due to onychomycosis of toenails of both feet   4. Diabetes mellitus type 2 with neurological manifestations [facility])        Plan:  Patient was evaluated and treated and all questions answered.  For now we will hold off on considering further options of surgery which I agreed with him is likely the safest route he would be at high risk of complications following surgery.  Patient educated on diabetes. Discussed proper diabetic foot care and discussed risks and complications of disease. Educated patient in depth on reasons to return to the office immediately should he/she discover anything concerning or new on the feet. All questions answered. Discussed proper shoes as well.   All symptomatic hyperkeratoses were  safely debrided with a sterile #15 blade to patient's level of comfort without incident. We discussed preventative and palliative care of these lesions including supportive and accommodative shoegear, padding, prefabricated and custom molded accommodative orthoses, use of a pumice stone and lotions/creams daily.  Discussed the etiology and treatment options for the condition in detail with the patient. Educated patient on the topical and oral treatment options for mycotic nails. Recommended debridement of the nails today. Sharp and mechanical debridement performed of all painful and mycotic nails today. Nails debrided in length and thickness using a nail nipper to level of comfort. Discussed treatment options including appropriate shoe gear. Follow up as needed for painful nails.     No follow-ups on file.

## 2021-12-19 DIAGNOSIS — I4891 Unspecified atrial fibrillation: Secondary | ICD-10-CM

## 2021-12-19 DIAGNOSIS — Z9289 Personal history of other medical treatment: Secondary | ICD-10-CM

## 2021-12-19 HISTORY — DX: Unspecified atrial fibrillation: I48.91

## 2021-12-19 HISTORY — DX: Personal history of other medical treatment: Z92.89

## 2021-12-31 ENCOUNTER — Other Ambulatory Visit (HOSPITAL_COMMUNITY): Payer: Self-pay

## 2022-01-02 ENCOUNTER — Other Ambulatory Visit: Payer: Self-pay

## 2022-01-02 ENCOUNTER — Other Ambulatory Visit (HOSPITAL_COMMUNITY): Payer: Self-pay

## 2022-01-02 DIAGNOSIS — L03116 Cellulitis of left lower limb: Secondary | ICD-10-CM | POA: Diagnosis not present

## 2022-01-02 DIAGNOSIS — Z23 Encounter for immunization: Secondary | ICD-10-CM | POA: Diagnosis not present

## 2022-01-04 DIAGNOSIS — E1140 Type 2 diabetes mellitus with diabetic neuropathy, unspecified: Secondary | ICD-10-CM | POA: Diagnosis not present

## 2022-01-04 DIAGNOSIS — E1165 Type 2 diabetes mellitus with hyperglycemia: Secondary | ICD-10-CM | POA: Diagnosis not present

## 2022-01-07 ENCOUNTER — Other Ambulatory Visit (HOSPITAL_COMMUNITY): Payer: Self-pay

## 2022-01-08 ENCOUNTER — Other Ambulatory Visit (HOSPITAL_COMMUNITY): Payer: Self-pay

## 2022-01-09 ENCOUNTER — Other Ambulatory Visit (HOSPITAL_COMMUNITY): Payer: Self-pay

## 2022-01-09 MED ORDER — MOUNJARO 10 MG/0.5ML ~~LOC~~ SOAJ
10.0000 mg | SUBCUTANEOUS | 2 refills | Status: DC
  Filled 2022-01-09: qty 2, 28d supply, fill #0

## 2022-01-15 DIAGNOSIS — M25572 Pain in left ankle and joints of left foot: Secondary | ICD-10-CM | POA: Diagnosis not present

## 2022-01-15 DIAGNOSIS — L03116 Cellulitis of left lower limb: Secondary | ICD-10-CM | POA: Diagnosis not present

## 2022-01-16 DIAGNOSIS — Z794 Long term (current) use of insulin: Secondary | ICD-10-CM | POA: Diagnosis not present

## 2022-01-16 DIAGNOSIS — E1165 Type 2 diabetes mellitus with hyperglycemia: Secondary | ICD-10-CM | POA: Diagnosis not present

## 2022-01-16 DIAGNOSIS — I2510 Atherosclerotic heart disease of native coronary artery without angina pectoris: Secondary | ICD-10-CM | POA: Diagnosis not present

## 2022-01-16 DIAGNOSIS — I10 Essential (primary) hypertension: Secondary | ICD-10-CM | POA: Diagnosis not present

## 2022-01-16 DIAGNOSIS — N1832 Chronic kidney disease, stage 3b: Secondary | ICD-10-CM | POA: Diagnosis not present

## 2022-01-16 DIAGNOSIS — E11621 Type 2 diabetes mellitus with foot ulcer: Secondary | ICD-10-CM | POA: Diagnosis not present

## 2022-01-16 DIAGNOSIS — I5032 Chronic diastolic (congestive) heart failure: Secondary | ICD-10-CM | POA: Diagnosis not present

## 2022-01-23 ENCOUNTER — Other Ambulatory Visit: Payer: Self-pay | Admitting: Cardiovascular Disease

## 2022-01-23 ENCOUNTER — Other Ambulatory Visit: Payer: Self-pay | Admitting: Cardiology

## 2022-01-24 NOTE — Telephone Encounter (Signed)
Refill Request.

## 2022-01-28 ENCOUNTER — Encounter: Payer: Self-pay | Admitting: Cardiology

## 2022-01-28 ENCOUNTER — Ambulatory Visit: Payer: Medicare Other | Attending: Cardiology | Admitting: Cardiology

## 2022-01-28 VITALS — BP 122/70 | HR 96 | Ht 73.0 in | Wt 326.4 lb

## 2022-01-28 DIAGNOSIS — E1169 Type 2 diabetes mellitus with other specified complication: Secondary | ICD-10-CM

## 2022-01-28 DIAGNOSIS — Z9861 Coronary angioplasty status: Secondary | ICD-10-CM

## 2022-01-28 DIAGNOSIS — I4819 Other persistent atrial fibrillation: Secondary | ICD-10-CM | POA: Insufficient documentation

## 2022-01-28 DIAGNOSIS — J984 Other disorders of lung: Secondary | ICD-10-CM

## 2022-01-28 DIAGNOSIS — R5382 Chronic fatigue, unspecified: Secondary | ICD-10-CM | POA: Diagnosis not present

## 2022-01-28 DIAGNOSIS — I5032 Chronic diastolic (congestive) heart failure: Secondary | ICD-10-CM | POA: Diagnosis not present

## 2022-01-28 DIAGNOSIS — I4891 Unspecified atrial fibrillation: Secondary | ICD-10-CM

## 2022-01-28 DIAGNOSIS — I878 Other specified disorders of veins: Secondary | ICD-10-CM | POA: Diagnosis not present

## 2022-01-28 DIAGNOSIS — E785 Hyperlipidemia, unspecified: Secondary | ICD-10-CM

## 2022-01-28 DIAGNOSIS — I10 Essential (primary) hypertension: Secondary | ICD-10-CM | POA: Diagnosis not present

## 2022-01-28 DIAGNOSIS — I2510 Atherosclerotic heart disease of native coronary artery without angina pectoris: Secondary | ICD-10-CM

## 2022-01-28 DIAGNOSIS — I272 Pulmonary hypertension, unspecified: Secondary | ICD-10-CM | POA: Diagnosis not present

## 2022-01-28 DIAGNOSIS — G4733 Obstructive sleep apnea (adult) (pediatric): Secondary | ICD-10-CM

## 2022-01-28 DIAGNOSIS — R0609 Other forms of dyspnea: Secondary | ICD-10-CM

## 2022-01-28 MED ORDER — APIXABAN 5 MG PO TABS: 5.0000 mg | ORAL_TABLET | Freq: Two times a day (BID) | ORAL | 2 refills | Status: DC

## 2022-01-28 NOTE — Progress Notes (Unsigned)
Primary Care Provider: Deland Pretty, [place] Cardiologist: Glenetta Hew, MD Electrophysiologist: None  Clinic Note: No chief complaint on file.   ===================================  ASSESSMENT/PLAN   Problem List Items Addressed This Visit       Cardiology Problems   Hyperlipidemia associated with type 2 diabetes mellitus ([place])   Chronic diastolic heart failure, NYHA class 2 - PCWPf 22 mmHg during catheterization. (Chronic)   CAD, LAD/CFX DES 04/28/12- staged RCA DES 04/29/12 after pt declined CABG, cath 05/14/13 stable CAD medical therapy - Primary (Chronic)   New onset atrial fibrillation ([place])   Pulmonary hypertension, 56mhg (Chronic)     Other   Fatigue (Chronic)   Dyspnea on exertion   (Chronic)   Restrictive lung disease (Chronic)   Morbid (severe) obesity due to excess calories (HCC) (Chronic)    ===================================  HPI:    Harold SOLEMis a morbidly obese 66y.o. male with a PMH notable for MV-CAD a(s/p MV PCI in 2014), Carotid Artery Disease, PAD, DM2 on insulin with CKD 3A-B, HTN, HLD, OSA/OHS on CPAP,nd Chronic Venous Stasis who presents today for 157-monthollow-up.  CAD: NSTEMI 2014 -> DES PCI RCA (3 overlapping Promus DES  2.25 x 8, 2.5 x 28 & 2.75 x 16), PCI - LAD (Xience DES 3 x 23 - 3.25) & LCx (2 overlapping Xience DES 2.5 x 12 x 2 -> postdilated to 2.75.) => Widely patent in March 2017 with 70% distal RCA and 90% apical LAD. Carotid Disease 2005 -> R CCA CEA, 4062-13%ICA) PAD - followed by Dr. ArFletcher Anon Severe Chronic Venous Stasis with BLE Edema DM-2 - with PN & CKD 3 OSA/OHS (with Pulm HTN) on CPAP -> PAP measured at 58 mmHg HTN' & HLD  Harold SCOTTas last seen on Jun 22, 2020 doing fairly well from a cardiac standpoint.  Harold [name] had a pretty significant depression back in April 2022 - eating junk food & lying around all day = finally "got his act together & and pulled himself up by the boot straps ".   Started because to what Harold [name] is eating and getting more active again.  Doing gardening and water walking Harold [name] could do.  Still limited by his back and hip pains. => No real cardiac symptoms.  Claudication controlled.  Mostly bothered by peripheral neuropathy.  Edema pretty well-controlled with no further wounds.  Currently thing of energy level.  Deconditioned.  Limited by back and joint pain knees and foot pain. Plan was to recheck lipid panel, continue rosuvastatin 20 mg. Discontinued aspirin, continued Plavix for combination of PAD and CAD Was considering getting bypass On 160 mg 3 times daily Lasix along with FaWilder GladeRecent Hospitalizations: none  Harold [name] was [facility]  Carotid and lower extremity Dopplers reviewed.  Lower extremity Dopplers revealed occluded left ATA and peroneal with PTA--medical management.  Harold [name] was seen by Dr. PhShelia Mediaack in August and was having issues with dizziness and orthostatic hypotension.  Multiple medication doses were reduced including losartan although down to 12.5 mg, and furosemide down to 160 g twice daily. ->  Harold [name] went through FaIranreplacing Invokana), and is now on Jardiance 25 mg.  His Mounjaro dose has been titrated up but unfortunately Harold [name] has gained weight.  Reviewed  CV studies:    The following studies were reviewed today: (if available, images/films reviewed: From Epic Chart or Care Everywhere) Current, and lower extremity Dopplers/ABIs  January 2023 -> reviewed:  Interval History:   Harold [name] presents today noting that over the last several months Harold [name] been getting more more fatigued.  Harold [name] has been having episodes where Harold [name] has more prominent than usual exertional dyspnea and fatigue.  Harold [name] is also had an episode where Harold [name] felt really dizzy and lightheaded.  Harold [name] was on Bhasin for a while and then multiplications were reduced.  The exertional dyspnea episodes started shortly after that.  Harold [name] denies  episodes of rapid or normal irregular beats.    CV Review of Symptoms (Summary): {roscv:[id number]}  REVIEWED OF SYSTEMS   ROS  I have reviewed and (if needed) personally updated the patient's problem list, medications, allergies, past medical and surgical history, social and family history.   PAST MEDICAL HISTORY   Past Medical History:  Diagnosis Date   Anginal pain [facility]) March 2015   Cardiac cath showed patent stents with distal LAD, circumflex-OM and RCA disease in small vessels.   Anxiety    CAD S/P percutaneous coronary angioplasty 2003, 04/2012   status post PCI to LAD, circumflex-OM 2, RCA   Carotid artery occlusion    Chronic renal insufficiency, stage II (mild)    Chronic venous insufficiency    varicosities, no reflux; dopplers 04/14/12- valvular insufficiency in the R and L GSV   Complication of anesthesia    COPD, mild (HCC)    Depression    situaltional    Diabetes mellitus    Diabetic neuropathy ([place]) 08/24/2019   Dyslipidemia associated with type 2 diabetes mellitus (HCC)    GERD (gastroesophageal reflux disease)    HTN (hypertension)    Hypothyroidism    Neuromuscular disorder (HCC)    neuropathy in feet   Neuropathy    notably improved following PCI with improved cardiac function   Obesity    Obesity, Class II, BMI 35.0-39.9, with comorbidity (see actual BMI)    BMI 39; wgt loss efforts in place; seeing Dietician   PONV (postoperative nausea and vomiting)    "[employer]"   Pulmonary hypertension ([place]) 04/2012   PA pressure 22mhg   Sleep apnea    uses nightly    PAST SURGICAL HISTORY   Past Surgical History:  Procedure Laterality Date   ABDOMINAL AORTAGRAM N/A 11/15/2011   Procedure: ABDOMINAL AMaxcine Ham  Surgeon: CElam Dutch MD;  [facility];  Service: Cardiovascular;  [name];   ABDOMINAL AORTOGRAM W/LOWER EXTREMITY N/A 10/13/2019   Procedure: ABDOMINAL AORTOGRAM W/LOWER EXTREMITY;  Surgeon: AWellington Hampshire MD;  [facility];  Service: Cardiovascular;  [name];   ABIs  04/27/2012   mild bilateral arterial insufficiency   BACK SURGERY  2005 x1   X2-2010   BUNIONECTOMY Right 11/11/2013   Procedure: RIGHT FOOT SILVER BUNIONECTOMY;  Surgeon: JWylene Simmer MD;  [facility]  Service: Orthopedics;  Laterality: Right;   CARDIAC CATHETERIZATION  12/11/2001   significant 3V CAD, normal LV function   Carotid Doppler  04/27/2012   right internal carotid: Elevated velocities but no evidence of plaque. Left internal carotid 40-59%   CAROTID ENDARTERECTOMY  2005   Right; recent carotid Dopplers notes elevated velocities.    colonscopy     CORONARY ANGIOPLASTY  12/21/2001   PTCA of the distal and mid AV groove circ, unsuccessful PTCA of second OM total occlusion, unsuccessful PTCA of the apical LAD total occlusion   CORONARY ANGIOPLASTY WITH STENT PLACEMENT  04/29/12   PCI to 3  RCA lesions, Promus Premiere 2.72mx8mm distally, mid was 2.566m8mm and proximally 2.75x1678mEF 55-60%   CORONARY STENT PLACEMENT  04/28/12   PCI to LAD (3x23m43mence DES postdilated to 3.25) and circ prox and mid (2 overlappinmg 2.5mmx47mmXience DES postdilated to 2.75mm)46mOPPLER ECHOCARDIOGRAPHY  04/28/2012   poor quality study: EF estimated 60-65%; unable to assess diastolic function (previously noted to have diastolic dysfunction); severely dilated left atrium and mild right atrium; dilated IVC consistent with increased central venous pressure..   HEMarland KitchenNIA REPAIR     LEFT AND RIGHT HEART CATHETERIZATION WITH CORONARY ANGIOGRAM N/A 04/27/2012   Procedure: LEFT AND RIGHT HEART CATHETERIZATION WITH CORONARY ANGIOGRAM;  Surgeon: Kathie Posa Leonie Man [facility];  Service: Cardiovascular;  [name];   LEFT AND RIGHT HEART CATHETERIZATION WITH CORONARY ANGIOGRAM N/A 05/14/2013   Procedure: LEFT AND RIGHT HEART CATHETERIZATION WITH CORONARY ANGIOGRAM;  Surgeon: ThomasTroy Sine [facility]  [place] [facility]: Moderate Pulm HTN: 46/16 - mean 33 mmHg; PCWP 22mmHg51[name], [name], Patent RCA ostial mid and distal stents - 70% distal runoff Dz   LUMBAR LAMINECTOMY/DECOMPRESSION MICRODISCECTOMY  01/07/2012   Procedure: LUMBAR LAMINECTOMY/DECOMPRESSION MICRODISCECTOMY 1 LEVEL;  Surgeon: Kyle L Winfield CunasLocation: [facility] NEURO ORS;  Service: Neurosurgery;  Laterality: Bilateral;   Lumbar Three-Four Decompression   LUMBAR LAMINECTOMY/DECOMPRESSION MICRODISCECTOMY N/A 07/26/2020   Procedure: Lumbar two-three Laminectomy/Foraminotomy;  Surgeon: CabbellAshok PallLocation: [facility] OR; Friantice: Neurosurgery;  [name];   PERCUTANEOUS CORONARY STENT INTERVENTION (PCI-S) N/A 04/28/2012   Procedure: PERCUTANEOUS CORONARY STENT INTERVENTION (PCI-S);  Surgeon: Thomas Troy SineLocation[facility];  Service: Cardiovascular;  [name];   PERCUTANEOUS CORONARY STENT INTERVENTION (PCI-S) N/A 04/29/2012   Procedure: PERCUTANEOUS CORONARY STENT INTERVENTION (PCI-S);  Surgeon: Kristain Filo WLeonie ManLocation[facility];  Service: Cardiovascular;  [name];   PERIPHERAL VASCULAR BALLOON ANGIOPLASTY Right 10/13/2019   Procedure: PERIPHERAL VASCULAR BALLOON ANGIOPLASTY;  Surgeon: Arida, Wellington HampshireLocation[facility] [place];  Service: Cardiovascular;  Laterality: Right;   SPINE SURGERY     UMBILICAL HERNIA REPAIR  2009   steel mesh insert    Immunization History  Administered Date(s) Administered   Influenza Split 11/18/2012   Influenza,inj,Quad PF,6+ Mos 10/16/2015   Influenza,inj,quad, With Preservative 11/21/2016   Influenza-Unspecified 12/05/2014   Pneumococcal Conjugate-13 08/30/2014   Pneumococcal Polysaccharide-23 11/18/2009   Tdap 09/28/2006   Zoster, Live 09/18/2013    MEDICATIONS/ALLERGIES   Current Meds  Medication Sig   allopurinol (ZYLOPRIM) 100 MG tablet Take 100 mg  by mouth 2 (two) times daily.   B-D ULTRAFINE III SHORT PEN 31G X 8 MM MISC Inject 1 each into the skin 3 (three) times daily.   carvedilol (COREG) 12.5 MG tablet TAKE 1 TABLET (12.5 MG TOTAL) BY MOUTH 2 (TWO) TIMES DAILY WITH A MEAL.   clopidogrel (PLAVIX) 75 MG tablet TAKE 1 TABLET BY MOUTH EVERY DAY   COLCRYS 0.6 MG tablet Take 0.6 mg by mouth daily.   Continuous Blood Gluc Sensor (FREESTYLE LIBRE 14 DAY SENSOR) MISC APPLY 1 SENSOR EVERY 14 DAYS   DULoxetine (CYMBALTA) 60 MG capsule Take 60 mg by mouth daily.   empagliflozin (JARDIANCE) 25 MG TABS tablet Take 25 mg by mouth daily.    escitalopram (LEXAPRO) 20 MG tablet Take 20 mg by mouth daily.  furosemide (LASIX) 80 MG tablet Take 160 mg by mouth 3 (three) times daily.   gabapentin (NEURONTIN) 800 MG tablet Take 1,600 mg by mouth 3 (three) times daily.   insulin regular human CONCENTRATED (HUMULIN R U-500 KWIKPEN) 500 UNIT/ML kwikpen Inject 120 Units into the skin 2 (two) times daily.   levothyroxine (SYNTHROID, LEVOTHROID) 88 MCG tablet Take 88 mcg by mouth daily before breakfast.   losartan (COZAAR) 25 MG tablet losartan 25 mg tablet  TAKE 1 TABLET BY MOUTH EVERY DAY   nitroGLYCERIN (NITROSTAT) 0.4 MG SL tablet PLACE 1 TAB UNDER THE TONGUE EVERY 5 MINUTES AS NEEDED FOR CHEST PAIN (SEVERE PRESSURE OR TIGHTNESS) (Patient taking differently: Place 0.4 mg under the tongue every 5 (five) minutes as needed for chest pain.)   omeprazole (PRILOSEC) 20 MG capsule TAKE 1 CAPSULE BY MOUTH EVERY DAY   potassium chloride (MICRO-K) 10 MEQ CR capsule Take 10 mEq by mouth daily.    rosuvastatin (CRESTOR) 20 MG tablet Take 20 mg by mouth daily.   tirzepatide (MOUNJARO) 15 MG/0.5ML Pen Inject 15 mg into the skin once a week.    Allergies  Allergen Reactions   Septra [Bactrim] Itching   Penicillins Hives    Allergy to All cillin drugs  Ancef given 9/24 with no obvious reaction   Metformin And Related Diarrhea and Nausea Only   Other Nausea And  Vomiting    General Anesthesia - sometimes causes nausea and vomiting    Sulfamethoxazole-Trimethoprim Other (See Comments)   Wellbutrin [Bupropion]     Other reaction(s): Mood Changes    SOCIAL HISTORY/FAMILY HISTORY   Reviewed in Epic:  Pertinent findings:  Social History   Tobacco Use   Smoking status: Former    Packs/day: 1.00    Years: 42.00    Total pack years: 42.00    Types: Cigarettes    Quit date: 03/23/2003    Years since quitting: 18.8   Smokeless tobacco: Never  Vaping Use   Vaping Use: Never used  Substance Use Topics   Alcohol use: No    Alcohol/week: 0.0 standard drinks of alcohol   Drug use: No   Social History   Social History Narrative   Harold [name] and his partner Harold [name] have now officially gotten married on 05/26/2013.  Harold [name] is also a patient of our clinic.  Harold [name] is now initially hyphenated his last name.   Harold [name] has now " retired"from his long-term employment at [employer]  As of September 2020   Harold [name] does not smoke, Harold [name] quit in 2009.  Harold [name] does not drink alcohol.   Harold [name] was adopted and no family history is known.     OBJCTIVE -PE, EKG, labs   Wt Readings from Last 3 Encounters:  01/28/22 (!) 326 lb 6.4 oz (148.1 kg)  03/27/21 (!) 327 lb (148.3 kg)  03/22/21 (!) 330 lb 6.4 oz (149.9 kg)    Physical Exam: BP 122/70 (BP Location: Right Arm, Patient Position: Sitting, Cuff Size: Large)   Pulse 96   Ht _0  (1.854 m)   Wt (!) 326 lb 6.4 oz (148.1 kg)   SpO2 99%   BMI 43.06 kg/m  Physical Exam Vitals reviewed.  Constitutional:      General: Harold [name] is not in acute distress.    Appearance: Harold [name] is obese. Harold [name] is ill-appearing (Has chronic ill appearance). Harold [name] is not toxic-appearing or diaphoretic.     Comments: Morbidly obese.  Well-groomed.  HENT:     Head: Normocephalic and atraumatic.  Neck:     Vascular: Carotid bruit (Soft bilateral) present. No JVD (Difficult to visualize.).  Cardiovascular:     Rate and Rhythm: Normal rate. Rhythm irregularly  irregular.     Chest Wall: PMI is not displaced (Unable to palpate).     Pulses: Decreased pulses.     Heart sounds: S1 normal and S2 normal. Heart sounds are distant. No murmur heard.    No friction rub. No gallop.  Pulmonary:     Effort: Pulmonary effort is normal. No respiratory distress.     Breath sounds: Normal breath sounds. No wheezing, rhonchi or rales.  Chest:     Chest wall: No tenderness.  Musculoskeletal:        General: Swelling (1+ bilateral LE swelling) present.     Cervical back: Normal range of motion and neck supple.  Skin:    Comments: Bilateral lower extremities from the knees down diffuse venous stasis changes with dry scaly skin.  No wounds or lesions.  Neurological:     General: No focal deficit present.     Mental Status: Harold [name] is alert and oriented to person, place, and time.  Psychiatric:        Behavior: Behavior normal.        Thought Content: Thought content normal.        Judgment: Judgment normal.     Comments: Mood seems to be pretty good.  Does not seem to be depressed today.     Adult ECG Report  Rate: 96;  Rhythm: atrial fibrillation; otherwise nonspecific ST-T wave changes.  Borderline low voltage.  Narrative Interpretation: New A-fib  Recent Labs:   08/31/2020: TC 208, TG 183, HDL 51, LDL 93. 08/27/2021: Hgb 16.7; Cr 1.73; TC 170, TG 183, HDL 51, LDL 82; Hgb 16.0 12/05/2021: A1c 75  Lab Results  Component Value Date   CREATININE 2.19 (H) 07/26/2020   BUN 38 (H) 07/24/2020   NA 135 07/24/2020   K 3.5 07/24/2020   CL 94 (L) 07/24/2020   CO2 29 07/24/2020      Latest Ref Rng & Units 07/26/2020    3:09 PM 07/24/2020    1:30 PM 09/28/2019   12:42 PM  CBC  WBC 4.0 - 10.5 K/uL 8.8  9.1  5.8   Hemoglobin 13.0 - 17.0 g/dL 15.1  15.8  15.8   Hematocrit 39.0 - 52.0 % 42.8  44.9  46.6   Platelets 150 - 400 K/uL 163  185  137     Lab Results  Component Value Date   HGBA1C 9.4 (H) 07/24/2020   Lab Results  Component Value Date   TSH 1.910  02/20/2018    ================================================== I spent a total of ***minutes with the patient spent in direct patient consultation.  Additional time spent with chart review  / charting (studies, outside notes, etc): *** min Total Time: *** min  Current medicines are reviewed at length with the patient today.  (+/- concerns) ***  Notice: This dictation was prepared with Dragon dictation along with smart phrase technology. Any transcriptional errors that result from this process are unintentional and may not be corrected upon review.  Studies Ordered:   No orders of the defined types were placed in this encounter.  No orders of the defined types were placed in this encounter.   Patient Instructions / Medication Changes & Studies & Tests Ordered   Patient Instructions  Medication Instructions:   Start taking     *If you need a refill  on your cardiac medications before your next appointment, please call your pharmacy*   Lab Work:  If you have labs (blood work) drawn today and your tests are completely normal, you will receive your results only by: [place] (if you have MyChart) OR A paper copy in the mail If you have any lab test that is abnormal or we need to change your treatment, we will call you to review the results.   Testing/Procedures:    Follow-Up: At [facility], you and your health needs are our priority.  As part of our continuing mission to provide you with exceptional heart care, we have created designated Provider Care Teams.  These Care Teams include your primary Cardiologist (physician) and Advanced Practice Providers (APPs -  Physician Assistants and Nurse Practitioners) who all work together to provide you with the care you need, when you need it.     Your next appointment:   {numbers 1-12:10294} month(s)  The format for your next appointment:   In Person  Provider:   Glenetta Hew, MD    Other Instructions        Leonie Man, MD, MS Glenetta Hew, M.D., M.S. Interventional Cardiologist  [place]  Pager # [phone] Phone # [phone] [address] [place], [place] [address]   Thank you for choosing [facility]!!

## 2022-01-28 NOTE — Patient Instructions (Signed)
Medication Instructions:   Start taking   Eliquis 5 mg twice a day   Stop taking clopidogrel 75 mg   *If you need a refill on your cardiac medications before your next appointment, please call your pharmacy*   Lab Work:  Not needed   Testing/Procedures:   Both Will be schedule at [facility] has requested that you have an echocardiogram. Echocardiography is a painless test that uses sound waves to create images of your heart. It provides your doctor with information about the size and shape of your heart and how well your heart's chambers and valves are working. This procedure takes approximately one hour. There are no restrictions for this procedure. Please do NOT wear cologne, perfume, aftershave, or lotions (deodorant is allowed). Please arrive 15 minutes prior to your appointment time.  and    Your doctor has scheduled you for a Myocardial Perfusion scan is to obtain information about the blood flow to your heart. The test consists of taking pictures of your heart in two phases: while resting and after a stress test, you will be given a drug intended to have a similar effect on the heart to that of exercise.  The test will take approximately 3 to 4  hours to complete.  How to prepare for your test: Do not eat or drink 2 hours prior to your test Do not consume products containing caffeine 12 hours prior to your test (examples: coffee (regular OR decaf), chocolate, sodas, tea) Your doctor may need you to hold certain medications prior to the test.  If so, these are listed below and should not be taken for 24 hours prior to the test.  If not listed below, you may take your medications as normal.  You may resume taking held medications on your normal schedule once the test is complete.   Meds to hold: none Do bring a list of your current medications with you.  If you have held any meds in preparation for the test, please bring them, as you may be  required to take them once the test is completed. Do wear comfortable clothes and walking shoes.  Do not wear dresses or overalls. Do NOT wear cologne, perfume, aftershave, or fragranced lotions the day of your test (deodorants okay). If these instructions are not followed your test will have to be rescheduled.   A nuclear cardiologist will review your test, prepare a report and send it to your physician.   If you have questions or concerns about your appointment, you can call the Nuclear Cardiology department at [phone]. If you cannot keep your appointment, please provide 48 hours notification to avoid a possible $50.00 charge to your account.   Please arrive 15 minutes prior to your appointment time for registration and insurance purposes    Follow-Up: At [facility], you and your health needs are our priority.  As part of our continuing mission to provide you with exceptional heart care, we have created designated Provider Care Teams.  These Care Teams include your primary Cardiologist (physician) and Advanced Practice Providers (APPs -  Physician Assistants and Nurse Practitioners) who all work together to provide you with the care you need, when you need it.     Your next appointment:   1 month(s)  The format for your next appointment:   In Person  Provider:   Glenetta Hew, MD   You have been referred to  afib clinic  the 2 second  week of Jan 2024 after 14 monitor is complete  Other Instructions  ZIO XT- Long Term Monitor Instructions  Your physician has requested you wear a ZIO patch monitor for 14 days.  This is a single patch monitor. [employer] supplies one patch monitor per enrollment. Additional stickers are not available. Please do not apply patch if you will be having a Nuclear Stress Test,  Echocardiogram, Cardiac CT, MRI, or Chest Xray during the period you would be wearing the  monitor. The patch cannot be worn during these tests. You cannot remove and  re-apply the  ZIO XT patch monitor.  Your ZIO patch monitor will be mailed 3 day USPS to your address on file. It may take 3-5 days  to receive your monitor after you have been enrolled.  Once you have received your monitor, please review the enclosed instructions. Your monitor  has already been registered assigning a specific monitor serial # to you.  Billing and Patient Assistance Program Information  We have supplied [employer] with any of your insurance information on file for billing purposes. [employer] offers a sliding scale Patient Assistance Program for patients that do not have  insurance, or whose insurance does not completely cover the cost of the ZIO monitor.  You must apply for the Patient Assistance Program to qualify for this discounted rate.  To apply, please call [employer] at [phone], select option 4, select option 2, ask to apply for  Patient Assistance Program. Theodore Demark will ask your household income, and how many people  are in your household. They will quote your out-of-pocket cost based on that information.  [employer] will also be able to set up a 2-month interest-free payment plan if needed.  Applying the monitor   Shave hair from upper left chest.  Hold abrader disc by orange tab. Rub abrader in 40 strokes over the upper left chest as  indicated in your monitor instructions.  Clean area with 4 enclosed alcohol pads. Let dry.  Apply patch as indicated in monitor instructions. Patch will be placed under collarbone on left  side of chest with arrow pointing upward.  Rub patch adhesive wings for 2 minutes. Remove white label marked "1". Remove the white  label marked "2". Rub patch adhesive wings for 2 additional minutes.  While looking in a mirror, press and release button in center of patch. A small green light will  flash 3-4 times. This will be your only indicator that the monitor has been turned on.  Do not shower for the first 24 hours. You may shower after the  first 24 hours.  Press the button if you feel a symptom. You will hear a small click. Record Date, Time and  Symptom in the Patient Logbook.  When you are ready to remove the patch, follow instructions on the last 2 pages of Patient  Logbook. Stick patch monitor onto the last page of Patient Logbook.  Place Patient Logbook in the blue and white box. Use locking tab on box and tape box closed  securely. The blue and white box has prepaid postage on it. Please place it in the mailbox as  soon as possible. Your physician should have your test results approximately 7 days after the  [facility]  Call [place] [phone] you have questions regarding  your ZIO XT patch monitor. Call them immediately if you see an orange light blinking on your  monitor.  If your monitor falls off  in less than 4 days, contact our Monitor department at [phone].  If your monitor becomes loose or falls off after 4 days call [employer] at [phone] for  suggestions on securing your monitor

## 2022-01-29 ENCOUNTER — Encounter: Payer: Self-pay | Admitting: Cardiology

## 2022-01-29 ENCOUNTER — Ambulatory Visit: Payer: Medicare Other | Attending: Cardiology

## 2022-01-29 DIAGNOSIS — I272 Pulmonary hypertension, unspecified: Secondary | ICD-10-CM

## 2022-01-29 DIAGNOSIS — I2510 Atherosclerotic heart disease of native coronary artery without angina pectoris: Secondary | ICD-10-CM

## 2022-01-29 DIAGNOSIS — J984 Other disorders of lung: Secondary | ICD-10-CM

## 2022-01-29 DIAGNOSIS — R5382 Chronic fatigue, unspecified: Secondary | ICD-10-CM

## 2022-01-29 DIAGNOSIS — I4891 Unspecified atrial fibrillation: Secondary | ICD-10-CM

## 2022-01-29 DIAGNOSIS — E1169 Type 2 diabetes mellitus with other specified complication: Secondary | ICD-10-CM

## 2022-01-29 DIAGNOSIS — R0609 Other forms of dyspnea: Secondary | ICD-10-CM

## 2022-01-29 DIAGNOSIS — I5032 Chronic diastolic (congestive) heart failure: Secondary | ICD-10-CM

## 2022-01-29 NOTE — Assessment & Plan Note (Signed)
Unfortunately, his weight is gone the wrong direction on Mounjaro.  Hopefully will start seeing some improvement soon.  Dr. Shelia Media dietary modification, especially with him having A-fib now.  Avoiding caffeine will be important-if he likes his diet [facility].

## 2022-01-29 NOTE — Assessment & Plan Note (Addendum)
Unfortunately he is not aware of irregular heartbeats or tachycardic spells but has simply been noting fatigue for a while.  I wonder if this is what is been going on.  No idea how long has been going on but he seems like he been having episodes of exacerbations but other times he been feeling well.  This is the first cardiac issues had in a while.  Would like to know what his A-fib burden is.  I am also little worried about some heart failure symptoms.  Need to exclude ischemia.  This patients CHA2DS2-VASc Score and unadjusted Ischemic Stroke Rate (% per year) is equal to 7.2 % stroke rate/year from a score of 5  Above score calculated as 1 point each if present [CHF, HTN, DM, Vascular=MI/PAD/Aortic Plaque, Age if 65-74, or Male] Above score calculated as 2 points each if present [Age > 75, or Stroke/TIA/TE]   Plan: DC Plavix, start Eliquis 5 mg twice daily Rate is already controlled with carvedilol 12.5 mg twice daily. Check 2D Echocardiogram and Lexiscan Myoview (see shared decision making/informed consent in patient instructions) 14-day Zio patch Will have him follow-up in A-fib clinic in 3 to 4 weeks.  If he remains in A-fib, would consider scheduling for DCCV. 2D echocardiogram will help determine left atrial size.  I think he may potentially be some of the good candidate for ablation if possible simply because his intolerance of A-fib.  He is also on multiple medications and may not be a great candidate for antiarrhythmics.

## 2022-01-29 NOTE — Assessment & Plan Note (Signed)
Still uses it every night.

## 2022-01-29 NOTE — Assessment & Plan Note (Signed)
Chronic, stable.  No recurrent wounds. Continue current diuretics. Support stop when able.  He probably does better with wraps.

## 2022-01-29 NOTE — Assessment & Plan Note (Signed)
Multivessel PCI years ago.  Has been on Plavix for a long time.  I think this point were okay going from Plavix to Eliquis.  Otherwise he is on beta-blocker, ARB, statin, GLP-1 agonist and SGLT2 inhibitor.  Lipids not quite at goal, but right now are dealing with A-fib.  Not can adjust medications for lipids until A-fib is controlled.

## 2022-01-29 NOTE — Assessment & Plan Note (Signed)
I suspect this is also a sensation of being in A-fib.

## 2022-01-29 NOTE — Assessment & Plan Note (Signed)
Intermittent NYHA class II and III symptoms.  Currently Class IIB.  I suspect this may be related to his A-fib.  He would actually do better with restoration of sinus rhythm.  Because of hypotension his ARB dose was reduced, now on 12.5 mg losartan along with 12.5 mg twice daily of carvedilol. Also on reduced dose of furosemide-160 mg twice daily. He is on diabetes dose Jardiance having switched from [place] to [place] and now on Jardiance. Also on Mounjaro.

## 2022-01-29 NOTE — Assessment & Plan Note (Signed)
Multifactorial, related to the lungs but also extreme obesity with OHS and now A-fib complicating his chronic HFpEF.  Checking 2D Echocardiogram and Lexiscan Myoview.

## 2022-01-29 NOTE — Assessment & Plan Note (Addendum)
Blood pressure well-controlled now when he is on reduced medications.   He is on 12.5 mg of carvedilol twice daily.  He is on reduced dose of losartan at 12.5 mg daily. For diuretic he is on high-dose furosemide which she has been on for a while but is now on twice daily dosing as opposed TID.

## 2022-01-29 NOTE — Progress Notes (Unsigned)
Enrolled for [employer] to mail a ZIO XT long term holter monitor to the patients address on file.

## 2022-02-02 ENCOUNTER — Other Ambulatory Visit (HOSPITAL_COMMUNITY): Payer: Self-pay

## 2022-02-03 DIAGNOSIS — E1165 Type 2 diabetes mellitus with hyperglycemia: Secondary | ICD-10-CM | POA: Diagnosis not present

## 2022-02-03 DIAGNOSIS — E1140 Type 2 diabetes mellitus with diabetic neuropathy, unspecified: Secondary | ICD-10-CM | POA: Diagnosis not present

## 2022-02-04 ENCOUNTER — Telehealth (HOSPITAL_COMMUNITY): Payer: Self-pay | Admitting: *Deleted

## 2022-02-04 NOTE — Telephone Encounter (Signed)
Left message on voicemail per DPR in reference to upcoming appointment scheduled on 02/06/22 with detailed instructions given per Myocardial Perfusion Study Information Sheet for the test. LM to arrive 15 minutes early, and that it is imperative to arrive on time for appointment to keep from having the test rescheduled. If you need to cancel or reschedule your appointment, please call the office within 24 hours of your appointment. Failure to do so may result in a cancellation of your appointment, and a $50 no show fee. Phone number given for call back for any questions. Kirstie Peri

## 2022-02-05 ENCOUNTER — Other Ambulatory Visit (HOSPITAL_COMMUNITY): Payer: Self-pay

## 2022-02-06 ENCOUNTER — Ambulatory Visit (HOSPITAL_COMMUNITY): Payer: Medicare Other | Attending: Cardiovascular Disease

## 2022-02-06 DIAGNOSIS — I2510 Atherosclerotic heart disease of native coronary artery without angina pectoris: Secondary | ICD-10-CM | POA: Diagnosis not present

## 2022-02-06 DIAGNOSIS — Z9861 Coronary angioplasty status: Secondary | ICD-10-CM | POA: Diagnosis not present

## 2022-02-06 DIAGNOSIS — I4891 Unspecified atrial fibrillation: Secondary | ICD-10-CM | POA: Diagnosis not present

## 2022-02-06 DIAGNOSIS — R5382 Chronic fatigue, unspecified: Secondary | ICD-10-CM | POA: Insufficient documentation

## 2022-02-06 DIAGNOSIS — J984 Other disorders of lung: Secondary | ICD-10-CM | POA: Diagnosis not present

## 2022-02-06 DIAGNOSIS — I272 Pulmonary hypertension, unspecified: Secondary | ICD-10-CM | POA: Insufficient documentation

## 2022-02-06 DIAGNOSIS — R0609 Other forms of dyspnea: Secondary | ICD-10-CM | POA: Insufficient documentation

## 2022-02-06 DIAGNOSIS — I5032 Chronic diastolic (congestive) heart failure: Secondary | ICD-10-CM | POA: Diagnosis not present

## 2022-02-06 DIAGNOSIS — E785 Hyperlipidemia, unspecified: Secondary | ICD-10-CM | POA: Diagnosis not present

## 2022-02-06 DIAGNOSIS — E1169 Type 2 diabetes mellitus with other specified complication: Secondary | ICD-10-CM | POA: Insufficient documentation

## 2022-02-06 MED ORDER — TECHNETIUM TC 99M TETROFOSMIN IV KIT
30.5000 | PACK | Freq: Once | INTRAVENOUS | Status: AC | PRN
  Administered 2022-02-06: 30.5 via INTRAVENOUS

## 2022-02-07 ENCOUNTER — Ambulatory Visit (HOSPITAL_COMMUNITY): Payer: Medicare Other | Attending: Cardiology

## 2022-02-07 DIAGNOSIS — Z9861 Coronary angioplasty status: Secondary | ICD-10-CM | POA: Insufficient documentation

## 2022-02-07 DIAGNOSIS — R0609 Other forms of dyspnea: Secondary | ICD-10-CM | POA: Insufficient documentation

## 2022-02-07 DIAGNOSIS — I272 Pulmonary hypertension, unspecified: Secondary | ICD-10-CM | POA: Insufficient documentation

## 2022-02-07 DIAGNOSIS — I2510 Atherosclerotic heart disease of native coronary artery without angina pectoris: Secondary | ICD-10-CM | POA: Insufficient documentation

## 2022-02-07 DIAGNOSIS — I5032 Chronic diastolic (congestive) heart failure: Secondary | ICD-10-CM | POA: Insufficient documentation

## 2022-02-07 DIAGNOSIS — I4891 Unspecified atrial fibrillation: Secondary | ICD-10-CM | POA: Insufficient documentation

## 2022-02-07 DIAGNOSIS — E1169 Type 2 diabetes mellitus with other specified complication: Secondary | ICD-10-CM | POA: Insufficient documentation

## 2022-02-07 DIAGNOSIS — E785 Hyperlipidemia, unspecified: Secondary | ICD-10-CM | POA: Insufficient documentation

## 2022-02-07 LAB — MYOCARDIAL PERFUSION IMAGING
LV dias vol: 145 mL (ref 62–150)
LV sys vol: 92 mL
Nuc Stress EF: 36 %
Peak HR: 111 {beats}/min
Rest HR: 103 {beats}/min
Rest Nuclear Isotope Dose: 30.5 mCi
SDS: 2
SRS: 0
SSS: 2
ST Depression (mm): 0 mm
Stress Nuclear Isotope Dose: 32.4 mCi
TID: 1.09

## 2022-02-07 MED ORDER — REGADENOSON 0.4 MG/5ML IV SOLN
0.4000 mg | Freq: Once | INTRAVENOUS | Status: AC
  Administered 2022-02-07: 0.4 mg via INTRAVENOUS

## 2022-02-07 MED ORDER — TECHNETIUM TC 99M TETROFOSMIN IV KIT
32.4000 | PACK | Freq: Once | INTRAVENOUS | Status: AC | PRN
  Administered 2022-02-07: 32.4 via INTRAVENOUS

## 2022-02-14 ENCOUNTER — Telehealth: Payer: Self-pay | Admitting: *Deleted

## 2022-02-14 DIAGNOSIS — L03116 Cellulitis of left lower limb: Secondary | ICD-10-CM | POA: Diagnosis not present

## 2022-02-14 DIAGNOSIS — M25572 Pain in left ankle and joints of left foot: Secondary | ICD-10-CM | POA: Diagnosis not present

## 2022-02-14 NOTE — Telephone Encounter (Signed)
Left detail message of results  on secure voicemail per DPR.  Any question may call back

## 2022-02-14 NOTE — Telephone Encounter (Signed)
Called patient to discuss results.   Left call back number.

## 2022-02-14 NOTE — Telephone Encounter (Signed)
Patient returned RN's call.

## 2022-02-14 NOTE — Telephone Encounter (Signed)
-----  Message from Leonie Man, MD sent at 02/10/2022  3:09 PM EST ----- Stress test shows evidence of prior heart attack in the underside of the heart that goes along with one of the heart artery blockages.  They does show some reduced pump function, but no evidence of "ischemia" -> a blockage that would be causing chest pain or pressure with supply versus demand related angina during exertion.  I would like to see the results of the echocardiogram because these stress test often underestimate cardiac pump function/ejection fraction.  We will know more once we get the echocardiogram results.  Glenetta Hew, MD

## 2022-02-15 ENCOUNTER — Telehealth: Payer: Self-pay | Admitting: Cardiology

## 2022-02-15 NOTE — Telephone Encounter (Signed)
Called patient to go over results, left message to call back, call back number given

## 2022-02-15 NOTE — Telephone Encounter (Signed)
Pt is returning call. Transferred to Ivin Booty, Therapist, sports.

## 2022-02-15 NOTE — Telephone Encounter (Signed)
Pt calling for myoview results

## 2022-02-15 NOTE — Telephone Encounter (Signed)
The patient has been notified of the result and verbalized understanding.  All questions (if any) were answered. Raiford Simmonds, RN 02/15/2022 2:41 PM

## 2022-02-15 NOTE — Telephone Encounter (Signed)
Called  left message  to call back if he had any questions

## 2022-02-19 ENCOUNTER — Ambulatory Visit (HOSPITAL_COMMUNITY)
Admission: RE | Admit: 2022-02-19 | Discharge: 2022-02-19 | Disposition: A | Discharge status: Home / Self Care | Payer: Medicare Other | Source: Ambulatory Visit | Attending: Physician Assistant | Admitting: Physician Assistant

## 2022-02-19 ENCOUNTER — Encounter (HOSPITAL_COMMUNITY): Payer: Self-pay | Admitting: Physician Assistant

## 2022-02-19 VITALS — BP 120/78 | HR 90 | Ht 73.0 in | Wt 327.2 lb

## 2022-02-19 DIAGNOSIS — E1151 Type 2 diabetes mellitus with diabetic peripheral angiopathy without gangrene: Secondary | ICD-10-CM | POA: Diagnosis not present

## 2022-02-19 DIAGNOSIS — E785 Hyperlipidemia, unspecified: Secondary | ICD-10-CM | POA: Insufficient documentation

## 2022-02-19 DIAGNOSIS — D6869 Other thrombophilia: Secondary | ICD-10-CM | POA: Diagnosis not present

## 2022-02-19 DIAGNOSIS — E1122 Type 2 diabetes mellitus with diabetic chronic kidney disease: Secondary | ICD-10-CM | POA: Diagnosis not present

## 2022-02-19 DIAGNOSIS — Z7985 Long-term (current) use of injectable non-insulin antidiabetic drugs: Secondary | ICD-10-CM | POA: Insufficient documentation

## 2022-02-19 DIAGNOSIS — I129 Hypertensive chronic kidney disease with stage 1 through stage 4 chronic kidney disease, or unspecified chronic kidney disease: Secondary | ICD-10-CM | POA: Insufficient documentation

## 2022-02-19 DIAGNOSIS — E669 Obesity, unspecified: Secondary | ICD-10-CM | POA: Insufficient documentation

## 2022-02-19 DIAGNOSIS — Z794 Long term (current) use of insulin: Secondary | ICD-10-CM | POA: Diagnosis not present

## 2022-02-19 DIAGNOSIS — Z79899 Other long term (current) drug therapy: Secondary | ICD-10-CM | POA: Insufficient documentation

## 2022-02-19 DIAGNOSIS — Z87891 Personal history of nicotine dependence: Secondary | ICD-10-CM | POA: Insufficient documentation

## 2022-02-19 DIAGNOSIS — I4819 Other persistent atrial fibrillation: Secondary | ICD-10-CM | POA: Diagnosis not present

## 2022-02-19 DIAGNOSIS — Z6841 Body Mass Index (BMI) 40.0 and over, adult: Secondary | ICD-10-CM | POA: Diagnosis not present

## 2022-02-19 DIAGNOSIS — Z7901 Long term (current) use of anticoagulants: Secondary | ICD-10-CM | POA: Insufficient documentation

## 2022-02-19 DIAGNOSIS — N189 Chronic kidney disease, unspecified: Secondary | ICD-10-CM | POA: Diagnosis not present

## 2022-02-19 DIAGNOSIS — G4733 Obstructive sleep apnea (adult) (pediatric): Secondary | ICD-10-CM | POA: Diagnosis not present

## 2022-02-19 DIAGNOSIS — I2510 Atherosclerotic heart disease of native coronary artery without angina pectoris: Secondary | ICD-10-CM | POA: Diagnosis not present

## 2022-02-19 NOTE — Progress Notes (Signed)
Primary Care Physician: Deland Pretty, MD Primary Cardiologist: Dr Ellyn Hack  Primary Electrophysiologist: none Referring Physician: Dr Fredderick Severance is a 68 y.o. male with a history of carotid disease, obstructive sleep apnea on CPAP, peripheral arterial disease, essential hypertension, obesity, chronic kidney disease, type 2 diabetes, CAD, hyperlipidemia, atrial fibrillation who presents for consultation in the [facility].  The patient was initially diagnosed with atrial fibrillation 01/28/22 at his visit with Dr Ellyn Hack. Patient had noticed more fatigue with exertion for the previous two months but was otherwise unaware of his arrhythmia. He was started on Eliquis for a CHADS2VASC score of 4. An echo and cardiac monitor were also ordered. He remains in afib today. He denies significant alcohol use and is compliant with his CPAP.   Today, he denies symptoms of palpitations, chest pain, shortness of breath, orthopnea, PND, lower extremity edema, dizziness, presyncope, syncope, bleeding, or neurologic sequela. The patient is tolerating medications without difficulties and is otherwise without complaint today.    Atrial Fibrillation Risk Factors:  he does have symptoms or diagnosis of sleep apnea. he is compliant with CPAP therapy. he does not have a history of rheumatic fever. he does not have a history of alcohol use.   he has a BMI of Body mass index is 43.17 kg/m.Marland Kitchen Filed Weights   02/19/22 1054  Weight: (!) 148.4 kg    Family History  Adopted: Yes  Family history unknown: Yes     Atrial Fibrillation Management history:  Previous antiarrhythmic drugs: none Previous cardioversions: none Previous ablations: none CHADS2VASC score: 4 Anticoagulation history: Eliquis   Past Medical History:  Diagnosis Date   Anginal pain [facility]) March 2015   Cardiac cath showed patent stents with distal LAD, circumflex-OM and RCA disease in  small vessels.   Anxiety    CAD S/P percutaneous coronary angioplasty 2003, 04/2012   status post PCI to LAD, circumflex-OM 2, RCA   Carotid artery occlusion    Chronic renal insufficiency, stage II (mild)    Chronic venous insufficiency    varicosities, no reflux; dopplers 04/14/12- valvular insufficiency in the R and L GSV   Complication of anesthesia    COPD, mild (HCC)    Depression    situaltional    Diabetes mellitus    Diabetic neuropathy ([place]) 08/24/2019   Dyslipidemia associated with type 2 diabetes mellitus (HCC)    GERD (gastroesophageal reflux disease)    HTN (hypertension)    Hypothyroidism    Neuromuscular disorder (HCC)    neuropathy in feet   Neuropathy    notably improved following PCI with improved cardiac function   Obesity    Obesity, Class II, BMI 35.0-39.9, with comorbidity (see actual BMI)    BMI 39; wgt loss efforts in place; seeing Dietician   PONV (postoperative nausea and vomiting)    "[employer]"   Pulmonary hypertension ([place]) 04/2012   PA pressure 55mhg   Sleep apnea    uses nightly   Past Surgical History:  Procedure Laterality Date   ABDOMINAL AORTAGRAM N/A 11/15/2011   Procedure: ABDOMINAL AMaxcine Ham  Surgeon: CElam Dutch MD;  [facility];  Service: Cardiovascular;  [name];   ABDOMINAL AORTOGRAM W/LOWER EXTREMITY N/A 10/13/2019   Procedure: ABDOMINAL AORTOGRAM W/LOWER EXTREMITY;  Surgeon: AWellington Hampshire MD;  [facility];  Service: CV: Non-obst Ao-Iliac. R SFA-PopA patent with significant calcific stenosis of TP trunk-prox PTA (dominant) & CTO ATA  ->  R PTA-TP PTCA   ABIs  04/27/2012   mild bilateral arterial insufficiency   BACK SURGERY  2005 x1   X2-2010   BUNIONECTOMY Right 11/11/2013   Procedure: RIGHT FOOT SILVER BUNIONECTOMY;  Surgeon: Wylene Simmer, MD;  Location: [place];  Service: Orthopedics;  Laterality: Right;   CARDIAC CATHETERIZATION  12/11/2001   significant 3V CAD,  normal LV function   Carotid Doppler  04/27/2012   right internal carotid: Elevated velocities but no evidence of plaque. Left internal carotid 40-59%   CAROTID ENDARTERECTOMY  2005   Right; recent carotid Dopplers notes elevated velocities.    colonscopy     CORONARY ANGIOPLASTY  12/21/2001   PTCA of the distal and mid AV groove circ, unsuccessful PTCA of second OM total occlusion, unsuccessful PTCA of the apical LAD total occlusion   CORONARY ANGIOPLASTY WITH STENT PLACEMENT  04/29/2012   PCI to 3 RCA lesions, Promus Premiere 2.40mx8mm distally, mid was 2.554m8mm and proximally 2.75x1648mEF 55-60%   CORONARY STENT PLACEMENT  04/28/2012   PCI to LAD (3x23m36mence DES postdilated to 3.25) and circ prox and mid (2 overlappinmg 2.5mmx24mmXience DES postdilated to 2.75mm)24mOPPLER ECHOCARDIOGRAPHY  04/28/2012   poor quality study: EF estimated 60-65%; unable to assess diastolic function (previously noted to have diastolic dysfunction); severely dilated left atrium and mild right atrium; dilated IVC consistent with increased central venous pressure..   HEMarland KitchenNIA REPAIR     LEFT AND RIGHT HEART CATHETERIZATION WITH CORONARY ANGIOGRAM N/A 04/27/2012   Procedure: LEFT AND RIGHT HEART CATHETERIZATION WITH CORONARY ANGIOGRAM;  Surgeon: David Leonie Man [facility];  Service: Cardiovascular;  [name];   LEFT AND RIGHT HEART CATHETERIZATION WITH CORONARY ANGIOGRAM N/A 05/14/2013   Procedure: LEFT AND RIGHT HEART CATHETERIZATION WITH CORONARY ANGIOGRAM;  Surgeon: ThomasTroy Sine [facility]: Moderate Pulm HTN: 46/16 - mean 33 mmHg; PCWP 22mmHg70[name], [name], Patent RCA ostial mid and distal stents - 70% distal runoff Dz   LUMBAR LAMINECTOMY/DECOMPRESSION MICRODISCECTOMY  01/07/2012   Procedure: LUMBAR LAMINECTOMY/DECOMPRESSION MICRODISCECTOMY 1 LEVEL;  Surgeon: Kyle L Winfield CunasLocation: [facility] NEURO ORS;  Service: Neurosurgery;  Laterality: Bilateral;   Lumbar Three-Four Decompression   LUMBAR LAMINECTOMY/DECOMPRESSION MICRODISCECTOMY N/A 07/26/2020   Procedure: Lumbar two-three Laminectomy/Foraminotomy;  Surgeon: CabbellAshok PallLocation: [facility] OR; [place]: Neurosurgery;  [name];   PERCUTANEOUS CORONARY STENT INTERVENTION (PCI-S) N/A 04/28/2012   Procedure: PERCUTANEOUS CORONARY STENT INTERVENTION (PCI-S);  Surgeon: Thomas Troy Sine[facility];  Service: Cardiovascular;  [name];   PERCUTANEOUS CORONARY STENT INTERVENTION (PCI-S) N/A 04/29/2012   Procedure: PERCUTANEOUS CORONARY STENT INTERVENTION (PCI-S);  Surgeon: David WLeonie Man[facility];  Service: Cardiovascular;  [name];   PERIPHERAL VASCULAR BALLOON ANGIOPLASTY Right 10/13/2019   Procedure: PERIPHERAL VASCULAR BALLOON ANGIOPLASTY;  Surgeon: Arida, Wellington HampshireLocation[facility];  Service: Cardiovascular;  Laterality: Right;  PTCA of R TP Trunk into PTA (Drug-coated) => Follow-up ABIs 0.9 on the right and 0.99 on the left.   SPINE SURGERY     UMBILICAL HERNIA REPAIR  2009   steel mesh insert    Current Outpatient Medications  Medication Sig Dispense Refill   allopurinol (ZYLOPRIM) 100 MG tablet Take 100 mg by mouth 2 (two) times daily.  apixaban (ELIQUIS) 5 MG TABS tablet Take 1 tablet (5 mg total) by mouth 2 (two) times daily. 180 tablet 2   ARIPiprazole (ABILIFY) 5 MG tablet Take 5 mg by mouth daily.     B-D ULTRAFINE III SHORT PEN 31G X 8 MM MISC Inject 1 each into the skin 3 (three) times daily.     carvedilol (COREG) 12.5 MG tablet TAKE 1 TABLET (12.5 MG TOTAL) BY MOUTH 2 (TWO) TIMES DAILY WITH A MEAL. 180 tablet 3   COLCRYS 0.6 MG tablet Take 0.6 mg by mouth daily.     Continuous Blood Gluc Sensor (FREESTYLE LIBRE 14 DAY SENSOR) MISC APPLY 1 SENSOR EVERY 14 DAYS  11   DULoxetine (CYMBALTA) 30 MG capsule duloxetine  30 mg capsule,delayed release  TAKE 1 CAPSULE BY MOUTH EVERY DAY     empagliflozin (JARDIANCE) 25 MG TABS tablet Take 25 mg by mouth daily.      escitalopram (LEXAPRO) 20 MG tablet Take 20 mg by mouth daily.     FARXIGA 10 MG TABS tablet Take 10 mg by mouth daily.     furosemide (LASIX) 80 MG tablet Take 160 mg by mouth 3 (three) times daily.     gabapentin (NEURONTIN) 300 MG capsule gabapentin 300 mg capsule  PLEASE SEE ATTACHED FOR DETAILED DIRECTIONS     insulin regular human CONCENTRATED (HUMULIN R U-500 KWIKPEN) 500 UNIT/ML kwikpen Inject 120 Units into the skin 2 (two) times daily.     ketoconazole (NIZORAL) 2 % cream ketoconazole 2 % topical cream  APPLY TO AFFECTED AREA EVERY DAY     levothyroxine (SYNTHROID, LEVOTHROID) 88 MCG tablet Take 88 mcg by mouth daily before breakfast.     losartan (COZAAR) 25 MG tablet losartan 25 mg tablet  TAKE 1 TABLET BY MOUTH EVERY DAY     meclizine (ANTIVERT) 25 MG tablet Take 25 mg by mouth 3 (three) times daily as needed.     mupirocin ointment (BACTROBAN) 2 % APPLY TO AFFECTED AREA TWICE A DAY 22 g 2   nitroGLYCERIN (NITROSTAT) 0.4 MG SL tablet PLACE 1 TAB UNDER THE TONGUE EVERY 5 MINUTES AS NEEDED FOR CHEST PAIN (SEVERE PRESSURE OR TIGHTNESS) (Patient taking differently: Place 0.4 mg under the tongue every 5 (five) minutes as needed for chest pain.) 25 tablet 3   omeprazole (PRILOSEC) 20 MG capsule TAKE 1 CAPSULE BY MOUTH EVERY DAY 90 capsule 3   potassium chloride (MICRO-K) 10 MEQ CR capsule Take 10 mEq by mouth daily.   5   rosuvastatin (CRESTOR) 20 MG tablet Take 20 mg by mouth daily.     tirzepatide (MOUNJARO) 15 MG/0.5ML Pen Inject 15 mg into the skin once a week. 6 mL 3   tiZANidine (ZANAFLEX) 4 MG tablet Take 1 tablet (4 mg total) by mouth every 6 (six) hours as needed for muscle spasms. 60 tablet 0   TRESIBA FLEXTOUCH 200 UNIT/ML FlexTouch Pen SMARTSIG:60 Unit(s) SUB-Q Daily     No current facility-administered medications for this  encounter.    Allergies  Allergen Reactions   Septra [Bactrim] Itching   Penicillins Hives    Allergy to All cillin drugs  Ancef given 9/24 with no obvious reaction   Metformin And Related Diarrhea and Nausea Only   Other Nausea And Vomiting    General Anesthesia - sometimes causes nausea and vomiting    Sulfamethoxazole-Trimethoprim Other (See Comments)   Wellbutrin [Bupropion]     Other reaction(s): Mood Changes    Social History  Socioeconomic History   Marital status: Married    Spouse name: Not on file   Number of children: 0   Years of education: hs   Highest education level: Not on file  Occupational History   Occupation: Brewing technologist: replacement limited  Tobacco Use   Smoking status: Former    Packs/day: 1.00    Years: 42.00    Total pack years: 42.00    Types: Cigarettes    Quit date: 03/23/2003    Years since quitting: 18.9   Smokeless tobacco: Never   Tobacco comments:    Former smoker 02/19/22  Vaping Use   Vaping Use: Never used  Substance and Sexual Activity   Alcohol use: No    Alcohol/week: 0.0 standard drinks of alcohol   Drug use: No   Sexual activity: Not on file  Other Topics Concern   Not on file  Social History Narrative   He and his partner [name] have now officially gotten married on 05/26/2013.  [name] is also a patient of our clinic.  He is now initially hyphenated his last name.   He has now " retired"from his long-term employment at [employer]  As of September 2020   He does not smoke, he quit in 2009.  He does not drink alcohol.   He was adopted and no family history is known.    Social Determinants of Health   Financial Resource Strain: Not on file  Food Insecurity: Not on file  Transportation Needs: Not on file  Physical Activity: Not on file  Stress: Not on file  Social Connections: Not on file  Intimate Partner Violence: Not on file     ROS- All systems are reviewed and negative except  as per the HPI above.  Physical Exam: Vitals:   02/19/22 1054  BP: 120/78  Pulse: 90  Weight: (!) 148.4 kg  Height: _0  (1.854 m)    GEN- The patient is a well appearing obese male, alert and oriented x 3 today.   Head- normocephalic, atraumatic Eyes-  Sclera clear, conjunctiva pink Ears- hearing intact Oropharynx- clear Neck- supple  Lungs- Clear to ausculation bilaterally, normal work of breathing Heart- irregular rate and rhythm, no murmurs, rubs or gallops  GI- soft, NT, ND, + BS Extremities- no clubbing, cyanosis, or edema MS- no significant deformity or atrophy Skin- no rash or lesion Psych- euthymic mood, full affect Neuro- strength and sensation are intact  Wt Readings from Last 3 Encounters:  02/19/22 (!) 148.4 kg  02/06/22 (!) 147.9 kg  01/28/22 (!) 148.1 kg    EKG today demonstrates  Afib Vent. rate 90 BPM PR interval * ms QRS duration 104 ms QT/QTcB 364/445 ms  Echo 04/28/12 demonstrated  Procedure narrative: Transthoracic echocardiography. Image    quality was poor. The study was technically difficult, as    a result of poor acoustic windows, poor sound wave    transmission, and body habitus.  - Left ventricle: The cavity size was normal. There was mild    concentric hypertrophy. Systolic function was normal. The    estimated ejection fraction was in the range of 60% to    65%. The study is not technically sufficient to allow    evaluation of LV diastolic function.  - Aortic valve: Trivial regurgitation.  - Left atrium: Severely dilated (40 ml/m2).  - Right atrium: The atrium was mildly dilated.  - Tricuspid valve: Mild regurgitation.  - Pulmonary arteries: PA peak pressure:  63m Hg (S).  - Inferior vena cava: The vessel was dilated; the    respirophasic diameter changes were blunted (< 50%);    findings are consistent with elevated central venous    pressure.   Epic records are reviewed at length today  CHA2DS2-VASc Score = 4  The patient's  score is based upon: CHF History: 0 HTN History: 1 Diabetes History: 1 Stroke History: 0 Vascular Disease History: 1 Age Score: 1 Gender Score: 0       ASSESSMENT AND PLAN: 1. Persistent Atrial Fibrillation (ICD10:  I48.19) The patient's CHA2DS2-VASc score is 4, indicating a 4.8% annual risk of stroke.   General education about afib provided and questions answered. We also discussed his stroke risk and the risks and benefits of anticoagulation. Patient has not worn monitor yet, he was waiting until after his echo which his scheduled for 02/21/22. Suspect he is persistent given his symptoms and ECG today. Will schedule a DCCV today. If his monitor shows paroxysmal afib, will cancel DCCV and revisit rhythm control options. His psychiatric medications limit AAD options.  Continue carvedilol 12.5 mg BID Continue Eliquis 5 mg BID Echo pending  2. Secondary Hypercoagulable State (ICD10:  D68.69) The patient is at significant risk for stroke/thromboembolism based upon his CHA2DS2-VASc Score of 4.  Continue Apixaban (Eliquis).   3. Obesity Body mass index is 43.17 kg/m. Lifestyle modification was discussed at length including regular exercise and weight reduction.  4. Obstructive sleep apnea The importance of adequate treatment of sleep apnea was discussed today in order to improve our ability to maintain sinus rhythm long term. Encouraged compliance with CPAP therapy.   5. CAD S/p several stents No anginal symptoms.  6. PAD Followed by Dr AFletcher Anon 7. HTN Stable, no changes today.   Follow up in the AF clinic following DCCV.    [facility]GLauniupoko Edgar [id number]/03/2022 12:07 PM

## 2022-02-19 NOTE — Patient Instructions (Addendum)
Cardioversion scheduled for: Wednesday, January 24th   - Arrive at the [facility] and go to admitting at 830am   - Do not eat or drink anything after midnight the night prior to your procedure.   - Take all your morning medication (except diabetic medications) with a sip of water prior to arrival.  - You will not be able to drive home after your procedure.    - Do NOT miss any doses of your blood thinner - if you should miss a dose please notify our office immediately.   - If you feel as if you go back into normal rhythm prior to scheduled cardioversion, please notify our office immediately.  If your procedure is canceled in the cardioversion suite you will be charged a cancellation fee.   If you are on weekly OZEMPIC, TRULICITY, MOUNJARO, WEGOVY, OR BYDUREON   Hold medication 7 days prior to scheduled procedure/anesthesia.  Restart medication on the normal dosing day after scheduled procedure/anesthesia -- hold 1/21 dose  If you are on daily BYETTA, VICTOZA, ADLYXIN, OR RYBELSUS:   Hold medication 24 hours prior to scheduled procedure/anesthesia.   Restart medication on the following day after scheduled procedure/anesthesia   For those patients who have a scheduled procedure/anesthesia on the same day of the week as their dose, hold the medication on the day of surgery.  They can take their scheduled dose the week before.  **Patients on the above medications scheduled for elective procedures that have not held the medication for the appropriate amount of time are at risk of cancellation or change in the anesthetic plan.

## 2022-02-19 NOTE — H&P (View-Only) (Signed)
  Primary Care Physician: Pharr, Walter, MD Primary Cardiologist: Dr Harding  Primary Electrophysiologist: none Referring Physician: Dr Harding    Harold [name] is a 66 y.o. male with a history of carotid disease, obstructive sleep apnea on CPAP, peripheral arterial disease, essential hypertension, obesity, chronic kidney disease, type 2 diabetes, CAD, hyperlipidemia, atrial fibrillation who presents for consultation in the [facility].  The patient was initially diagnosed with atrial fibrillation 01/28/22 at his visit with Dr Harding. Patient had noticed more fatigue with exertion for the previous two months but was otherwise unaware of his arrhythmia. He was started on Eliquis for a CHADS2VASC score of 4. An echo and cardiac monitor were also ordered. He remains in afib today. He denies significant alcohol use and is compliant with his CPAP.   Today, he denies symptoms of palpitations, chest pain, shortness of breath, orthopnea, PND, lower extremity edema, dizziness, presyncope, syncope, bleeding, or neurologic sequela. The patient is tolerating medications without difficulties and is otherwise without complaint today.    Atrial Fibrillation Risk Factors:  he does have symptoms or diagnosis of sleep apnea. he is compliant with CPAP therapy. he does not have a history of rheumatic fever. he does not have a history of alcohol use.   he has a BMI of Body mass index is 43.17 kg/m.. Filed Weights   02/19/22 1054  Weight: (!) 148.4 kg    Family History  Adopted: Yes  Family history unknown: Yes     Atrial Fibrillation Management history:  Previous antiarrhythmic drugs: none Previous cardioversions: none Previous ablations: none CHADS2VASC score: 4 Anticoagulation history: Eliquis   Past Medical History:  Diagnosis Date   Anginal pain (HCC) March 2015   Cardiac cath showed patent stents with distal LAD, circumflex-OM and RCA disease in  small vessels.   Anxiety    CAD S/P percutaneous coronary angioplasty 2003, 04/2012   status post PCI to LAD, circumflex-OM 2, RCA   Carotid artery occlusion    Chronic renal insufficiency, stage II (mild)    Chronic venous insufficiency    varicosities, no reflux; dopplers 04/14/12- valvular insufficiency in the R and L GSV   Complication of anesthesia    COPD, mild (HCC)    Depression    situaltional    Diabetes mellitus    Diabetic neuropathy (HCC) 08/24/2019   Dyslipidemia associated with type 2 diabetes mellitus (HCC)    GERD (gastroesophageal reflux disease)    HTN (hypertension)    Hypothyroidism    Neuromuscular disorder (HCC)    neuropathy in feet   Neuropathy    notably improved following PCI with improved cardiac function   Obesity    Obesity, Class II, BMI 35.0-39.9, with comorbidity (see actual BMI)    BMI 39; wgt loss efforts in place; seeing Dietician   PONV (postoperative nausea and vomiting)    "[employer]"   Pulmonary hypertension (HCC) 04/2012   PA pressure 58mmhg   Sleep apnea    uses nightly   Past Surgical History:  Procedure Laterality Date   ABDOMINAL AORTAGRAM N/A 11/15/2011   Procedure: ABDOMINAL AORTAGRAM;  Surgeon: Charles E Fields, MD;  Location: [facility];  Service: Cardiovascular;  [name];   ABDOMINAL AORTOGRAM W/LOWER EXTREMITY N/A 10/13/2019   Procedure: ABDOMINAL AORTOGRAM W/LOWER EXTREMITY;  Surgeon: Arida, Muhammad A, MD;  Location: [facility];  Service: CV: Non-obst Ao-Iliac. R SFA-PopA patent with significant calcific stenosis of TP trunk-prox PTA (dominant) & CTO ATA  ->   R PTA-TP PTCA   ABIs  04/27/2012   mild bilateral arterial insufficiency   BACK SURGERY  2005 x1   X2-2010   BUNIONECTOMY Right 11/11/2013   Procedure: RIGHT FOOT SILVER BUNIONECTOMY;  Surgeon: John [name], MD;  Location: [facility];  Service: Orthopedics;  Laterality: Right;   CARDIAC CATHETERIZATION  12/11/2001   significant 3V CAD,  normal LV function   Carotid Doppler  04/27/2012   right internal carotid: Elevated velocities but no evidence of plaque. Left internal carotid 40-59%   CAROTID ENDARTERECTOMY  2005   Right; recent carotid Dopplers notes elevated velocities.    colonscopy     CORONARY ANGIOPLASTY  12/21/2001   PTCA of the distal and mid AV groove circ, unsuccessful PTCA of second OM total occlusion, unsuccessful PTCA of the apical LAD total occlusion   CORONARY ANGIOPLASTY WITH STENT PLACEMENT  04/29/2012   PCI to 3 RCA lesions, Promus Premiere 2.25mmx8mm distally, mid was 2.5mx28mm and proximally 2.75x16mm, EF 55-60%   CORONARY STENT PLACEMENT  04/28/2012   PCI to LAD (3x23mm Xience DES postdilated to 3.25) and circ prox and mid (2 overlappinmg 2.5mmx12mmXience DES postdilated to 2.75mm)   DOPPLER ECHOCARDIOGRAPHY  04/28/2012   poor quality study: EF estimated 60-65%; unable to assess diastolic function (previously noted to have diastolic dysfunction); severely dilated left atrium and mild right atrium; dilated IVC consistent with increased central venous pressure..   HERNIA REPAIR     LEFT AND RIGHT HEART CATHETERIZATION WITH CORONARY ANGIOGRAM N/A 04/27/2012   Procedure: LEFT AND RIGHT HEART CATHETERIZATION WITH CORONARY ANGIOGRAM;  Surgeon: David W Harding, MD;  Location: [facility];  Service: Cardiovascular;  [name];   LEFT AND RIGHT HEART CATHETERIZATION WITH CORONARY ANGIOGRAM N/A 05/14/2013   Procedure: LEFT AND RIGHT HEART CATHETERIZATION WITH CORONARY ANGIOGRAM;  Surgeon: Thomas A Kelly, MD;  [name]; PCWP 22mmHg;; [name], [name], Patent RCA ostial mid and distal stents - 70% distal runoff Dz   LUMBAR LAMINECTOMY/DECOMPRESSION MICRODISCECTOMY  01/07/2012   Procedure: LUMBAR LAMINECTOMY/DECOMPRESSION MICRODISCECTOMY 1 LEVEL;  Surgeon: Kyle L  Cabbell, MD;  [name];  Service: Neurosurgery;  Laterality: Bilateral;   Lumbar Three-Four Decompression   LUMBAR LAMINECTOMY/DECOMPRESSION MICRODISCECTOMY N/A 07/26/2020   Procedure: Lumbar two-three Laminectomy/Foraminotomy;  Surgeon: Cabbell, Kyle, MD;  [name];  Service: Neurosurgery;  [name];   PERCUTANEOUS CORONARY STENT INTERVENTION (PCI-S) N/A 04/28/2012   Procedure: PERCUTANEOUS CORONARY STENT INTERVENTION (PCI-S);  Surgeon: Thomas A Kelly, MD;  Location: [facility];  Service: Cardiovascular;  [name];   PERCUTANEOUS CORONARY STENT INTERVENTION (PCI-S) N/A 04/29/2012   Procedure: PERCUTANEOUS CORONARY STENT INTERVENTION (PCI-S);  Surgeon: David W Harding, MD;  Location: [facility];  Service: Cardiovascular;  [name];   PERIPHERAL VASCULAR BALLOON ANGIOPLASTY Right 10/13/2019   Procedure: PERIPHERAL VASCULAR BALLOON ANGIOPLASTY;  Surgeon: Arida, Muhammad A, MD;  [facility]: [facility];  Service: Cardiovascular;  Laterality: Right;  PTCA of R TP Trunk into PTA (Drug-coated) => Follow-up ABIs 0.9 on the right and 0.99 on the left.   SPINE SURGERY     UMBILICAL HERNIA REPAIR  2009   steel mesh insert    Current Outpatient Medications  Medication Sig Dispense Refill   allopurinol (ZYLOPRIM) 100 MG tablet Take 100 mg by mouth 2 (two) times daily.       apixaban (ELIQUIS) 5 MG TABS tablet Take 1 tablet (5 mg total) by mouth 2 (two) times daily. 180 tablet 2   ARIPiprazole (ABILIFY) 5 MG tablet Take 5 mg by mouth daily.     B-D ULTRAFINE III SHORT PEN 31G X 8 MM MISC Inject 1 each into the skin 3 (three) times daily.     carvedilol (COREG) 12.5 MG tablet TAKE 1 TABLET (12.5 MG TOTAL) BY MOUTH 2 (TWO) TIMES DAILY WITH A MEAL. 180 tablet 3   COLCRYS 0.6 MG tablet Take 0.6 mg by mouth daily.     Continuous Blood Gluc Sensor (FREESTYLE LIBRE 14 DAY SENSOR) MISC APPLY 1 SENSOR EVERY 14 DAYS  11   DULoxetine (CYMBALTA) 30 MG capsule duloxetine  30 mg capsule,delayed release  TAKE 1 CAPSULE BY MOUTH EVERY DAY     empagliflozin (JARDIANCE) 25 MG TABS tablet Take 25 mg by mouth daily.      escitalopram (LEXAPRO) 20 MG tablet Take 20 mg by mouth daily.     FARXIGA 10 MG TABS tablet Take 10 mg by mouth daily.     furosemide (LASIX) 80 MG tablet Take 160 mg by mouth 3 (three) times daily.     gabapentin (NEURONTIN) 300 MG capsule gabapentin 300 mg capsule  PLEASE SEE ATTACHED FOR DETAILED DIRECTIONS     insulin regular human CONCENTRATED (HUMULIN R U-500 KWIKPEN) 500 UNIT/ML kwikpen Inject 120 Units into the skin 2 (two) times daily.     ketoconazole (NIZORAL) 2 % cream ketoconazole 2 % topical cream  APPLY TO AFFECTED AREA EVERY DAY     levothyroxine (SYNTHROID, LEVOTHROID) 88 MCG tablet Take 88 mcg by mouth daily before breakfast.     losartan (COZAAR) 25 MG tablet losartan 25 mg tablet  TAKE 1 TABLET BY MOUTH EVERY DAY     meclizine (ANTIVERT) 25 MG tablet Take 25 mg by mouth 3 (three) times daily as needed.     mupirocin ointment (BACTROBAN) 2 % APPLY TO AFFECTED AREA TWICE A DAY 22 g 2   nitroGLYCERIN (NITROSTAT) 0.4 MG SL tablet PLACE 1 TAB UNDER THE TONGUE EVERY 5 MINUTES AS NEEDED FOR CHEST PAIN (SEVERE PRESSURE OR TIGHTNESS) (Patient taking differently: Place 0.4 mg under the tongue every 5 (five) minutes as needed for chest pain.) 25 tablet 3   omeprazole (PRILOSEC) 20 MG capsule TAKE 1 CAPSULE BY MOUTH EVERY DAY 90 capsule 3   potassium chloride (MICRO-K) 10 MEQ CR capsule Take 10 mEq by mouth daily.   5   rosuvastatin (CRESTOR) 20 MG tablet Take 20 mg by mouth daily.     tirzepatide (MOUNJARO) 15 MG/0.5ML Pen Inject 15 mg into the skin once a week. 6 mL 3   tiZANidine (ZANAFLEX) 4 MG tablet Take 1 tablet (4 mg total) by mouth every 6 (six) hours as needed for muscle spasms. 60 tablet 0   TRESIBA FLEXTOUCH 200 UNIT/ML FlexTouch Pen SMARTSIG:60 Unit(s) SUB-Q Daily     No current facility-administered medications for this  encounter.    Allergies  Allergen Reactions   Septra [Bactrim] Itching   Penicillins Hives    Allergy to All cillin drugs  Ancef given 9/24 with no obvious reaction   Metformin And Related Diarrhea and Nausea Only   Other Nausea And Vomiting    General Anesthesia - sometimes causes nausea and vomiting    Sulfamethoxazole-Trimethoprim Other (See Comments)   Wellbutrin [Bupropion]     Other reaction(s): Mood Changes    Social History  Socioeconomic History   Marital status: Married    Spouse name: Not on file   Number of children: 0   Years of education: hs   Highest education level: Not on file  Occupational History   Occupation: [employer]    Employer: [employer]  Tobacco Use   Smoking status: Former    Packs/day: 1.00    Years: 42.00    Total pack years: 42.00    Types: Cigarettes    Quit date: 03/23/2003    Years since quitting: 18.9   Smokeless tobacco: Never   Tobacco comments:    Former smoker 02/19/22  Vaping Use   Vaping Use: Never used  Substance and Sexual Activity   Alcohol use: No    Alcohol/week: 0.0 standard drinks of alcohol   Drug use: No   Sexual activity: Not on file  Other Topics Concern   Not on file  Social History Narrative   He and his partner George have now officially gotten married on 05/26/2013.  George is also a patient of our clinic.  He is now initially hyphenated his last name.   He has now " retired"from his long-term employment at [employer]. ->  As of September 2020   He does not smoke, he quit in 2009.  He does not drink alcohol.   He was adopted and no family history is known.    Social Determinants of Health   Financial Resource Strain: Not on file  Food Insecurity: Not on file  Transportation Needs: Not on file  Physical Activity: Not on file  Stress: Not on file  Social Connections: Not on file  Intimate Partner Violence: Not on file     ROS- All systems are reviewed and negative except  as per the HPI above.  Physical Exam: Vitals:   02/19/22 1054  BP: 120/78  Pulse: 90  Weight: (!) 148.4 kg  Height: 6' 1" (1.854 m)    GEN- The patient is a well appearing obese male, alert and oriented x 3 today.   Head- normocephalic, atraumatic Eyes-  Sclera clear, conjunctiva pink Ears- hearing intact Oropharynx- clear Neck- supple  Lungs- Clear to ausculation bilaterally, normal work of breathing Heart- irregular rate and rhythm, no murmurs, rubs or gallops  GI- soft, NT, ND, + BS Extremities- no clubbing, cyanosis, or edema MS- no significant deformity or atrophy Skin- no rash or lesion Psych- euthymic mood, full affect Neuro- strength and sensation are intact  Wt Readings from Last 3 Encounters:  02/19/22 (!) 148.4 kg  02/06/22 (!) 147.9 kg  01/28/22 (!) 148.1 kg    EKG today demonstrates  Afib Vent. rate 90 BPM PR interval * ms QRS duration 104 ms QT/QTcB 364/445 ms  Echo 04/28/12 demonstrated  Procedure narrative: Transthoracic echocardiography. Image    quality was poor. The study was technically difficult, as    a result of poor acoustic windows, poor sound wave    transmission, and body habitus.  - Left ventricle: The cavity size was normal. There was mild    concentric hypertrophy. Systolic function was normal. The    estimated ejection fraction was in the range of 60% to    65%. The study is not technically sufficient to allow    evaluation of LV diastolic function.  - Aortic valve: Trivial regurgitation.  - Left atrium: Severely dilated (40 ml/m2).  - Right atrium: The atrium was mildly dilated.  - Tricuspid valve: Mild regurgitation.  - Pulmonary arteries: PA peak pressure:   53mm Hg (S).  - Inferior vena cava: The vessel was dilated; the    respirophasic diameter changes were blunted (< 50%);    findings are consistent with elevated central venous    pressure.   Epic records are reviewed at length today  CHA2DS2-VASc Score = 4  The patient's  score is based upon: CHF History: 0 HTN History: 1 Diabetes History: 1 Stroke History: 0 Vascular Disease History: 1 Age Score: 1 Gender Score: 0       ASSESSMENT AND PLAN: 1. Persistent Atrial Fibrillation (ICD10:  I48.19) The patient's CHA2DS2-VASc score is 4, indicating a 4.8% annual risk of stroke.   General education about afib provided and questions answered. We also discussed his stroke risk and the risks and benefits of anticoagulation. Patient has not worn monitor yet, he was waiting until after his echo which his scheduled for 02/21/22. Suspect he is persistent given his symptoms and ECG today. Will schedule a DCCV today. If his monitor shows paroxysmal afib, will cancel DCCV and revisit rhythm control options. His psychiatric medications limit AAD options.  Continue carvedilol 12.5 mg BID Continue Eliquis 5 mg BID Echo pending  2. Secondary Hypercoagulable State (ICD10:  D68.69) The patient is at significant risk for stroke/thromboembolism based upon his CHA2DS2-VASc Score of 4.  Continue Apixaban (Eliquis).   3. Obesity Body mass index is 43.17 kg/m. Lifestyle modification was discussed at length including regular exercise and weight reduction.  4. Obstructive sleep apnea The importance of adequate treatment of sleep apnea was discussed today in order to improve our ability to maintain sinus rhythm long term. Encouraged compliance with CPAP therapy.   5. CAD S/p several stents No anginal symptoms.  6. PAD Followed by Dr Arida  7. HTN Stable, no changes today.   Follow up in the AF clinic following DCCV.    Ricky Georgina Krist PA-C [facility] [address] [place], [place] [address] [phone] 02/19/2022 12:07 PM  

## 2022-02-21 ENCOUNTER — Ambulatory Visit (HOSPITAL_COMMUNITY): Payer: Medicare Other | Attending: Internal Medicine

## 2022-02-21 DIAGNOSIS — R0609 Other forms of dyspnea: Secondary | ICD-10-CM | POA: Insufficient documentation

## 2022-02-21 DIAGNOSIS — I5032 Chronic diastolic (congestive) heart failure: Secondary | ICD-10-CM | POA: Diagnosis not present

## 2022-02-21 DIAGNOSIS — I4891 Unspecified atrial fibrillation: Secondary | ICD-10-CM | POA: Insufficient documentation

## 2022-02-21 DIAGNOSIS — R5382 Chronic fatigue, unspecified: Secondary | ICD-10-CM | POA: Diagnosis not present

## 2022-02-21 DIAGNOSIS — I2510 Atherosclerotic heart disease of native coronary artery without angina pectoris: Secondary | ICD-10-CM | POA: Insufficient documentation

## 2022-02-21 DIAGNOSIS — Z9861 Coronary angioplasty status: Secondary | ICD-10-CM | POA: Insufficient documentation

## 2022-02-21 DIAGNOSIS — E785 Hyperlipidemia, unspecified: Secondary | ICD-10-CM | POA: Diagnosis not present

## 2022-02-21 DIAGNOSIS — I272 Pulmonary hypertension, unspecified: Secondary | ICD-10-CM

## 2022-02-21 DIAGNOSIS — E1169 Type 2 diabetes mellitus with other specified complication: Secondary | ICD-10-CM | POA: Diagnosis not present

## 2022-02-21 DIAGNOSIS — J984 Other disorders of lung: Secondary | ICD-10-CM | POA: Insufficient documentation

## 2022-02-21 DIAGNOSIS — R9431 Abnormal electrocardiogram [ECG] [EKG]: Secondary | ICD-10-CM | POA: Insufficient documentation

## 2022-02-21 LAB — ECHOCARDIOGRAM COMPLETE: S' Lateral: 4.5 cm

## 2022-02-21 MED ORDER — PERFLUTREN LIPID MICROSPHERE
1.0000 mL | INTRAVENOUS | Status: AC | PRN
  Administered 2022-02-21: 3 mL via INTRAVENOUS

## 2022-02-22 ENCOUNTER — Telehealth: Payer: Self-pay | Admitting: *Deleted

## 2022-02-22 DIAGNOSIS — L0390 Cellulitis, unspecified: Secondary | ICD-10-CM | POA: Diagnosis not present

## 2022-02-22 NOTE — Telephone Encounter (Signed)
Patient return call. The patient has been notified of the result and verbalized understanding.  All questions (if any) were answered. Raiford Simmonds, RN 02/22/2022 3:53 PM

## 2022-02-22 NOTE — Telephone Encounter (Signed)
-----  Message from Leonie Man, MD sent at 02/21/2022  3:01 PM EST ----- Echocardiogram results are very reassuring.  Ejection fraction is still good at 50 to 55%.  This is low normal but no regional wall motion abnormalities to suspect recent injury.  Also notably the left atrium is only mildly enlarged which is also reassuring for trend maintain sinus rhythm.  Other than some mild aortic valve calcification, the rest of the study looks pretty normal.  Very reassuring.  Glenetta Hew, MD

## 2022-02-22 NOTE — Telephone Encounter (Signed)
Left detail of echo result on secure voicemail. Any question may call back

## 2022-02-27 DIAGNOSIS — L03116 Cellulitis of left lower limb: Secondary | ICD-10-CM | POA: Diagnosis not present

## 2022-02-27 DIAGNOSIS — L97909 Non-pressure chronic ulcer of unspecified part of unspecified lower leg with unspecified severity: Secondary | ICD-10-CM | POA: Diagnosis not present

## 2022-02-27 DIAGNOSIS — E11622 Type 2 diabetes mellitus with other skin ulcer: Secondary | ICD-10-CM | POA: Diagnosis not present

## 2022-03-04 ENCOUNTER — Other Ambulatory Visit (HOSPITAL_COMMUNITY): Payer: Self-pay

## 2022-03-05 DIAGNOSIS — Z95828 Presence of other vascular implants and grafts: Secondary | ICD-10-CM | POA: Diagnosis not present

## 2022-03-05 DIAGNOSIS — I4891 Unspecified atrial fibrillation: Secondary | ICD-10-CM | POA: Diagnosis not present

## 2022-03-05 DIAGNOSIS — B957 Other staphylococcus as the cause of diseases classified elsewhere: Secondary | ICD-10-CM | POA: Diagnosis not present

## 2022-03-05 DIAGNOSIS — Z794 Long term (current) use of insulin: Secondary | ICD-10-CM | POA: Diagnosis not present

## 2022-03-05 DIAGNOSIS — R600 Localized edema: Secondary | ICD-10-CM | POA: Diagnosis not present

## 2022-03-05 DIAGNOSIS — I83029 Varicose veins of left lower extremity with ulcer of unspecified site: Secondary | ICD-10-CM | POA: Diagnosis not present

## 2022-03-05 DIAGNOSIS — L97822 Non-pressure chronic ulcer of other part of left lower leg with fat layer exposed: Secondary | ICD-10-CM | POA: Diagnosis not present

## 2022-03-05 DIAGNOSIS — E1165 Type 2 diabetes mellitus with hyperglycemia: Secondary | ICD-10-CM | POA: Diagnosis not present

## 2022-03-05 DIAGNOSIS — I83892 Varicose veins of left lower extremities with other complications: Secondary | ICD-10-CM | POA: Diagnosis not present

## 2022-03-05 DIAGNOSIS — Z95820 Peripheral vascular angioplasty status with implants and grafts: Secondary | ICD-10-CM | POA: Diagnosis not present

## 2022-03-05 DIAGNOSIS — B9689 Other specified bacterial agents as the cause of diseases classified elsewhere: Secondary | ICD-10-CM | POA: Diagnosis not present

## 2022-03-05 DIAGNOSIS — L739 Follicular disorder, unspecified: Secondary | ICD-10-CM | POA: Diagnosis not present

## 2022-03-05 DIAGNOSIS — Z7901 Long term (current) use of anticoagulants: Secondary | ICD-10-CM | POA: Diagnosis not present

## 2022-03-05 DIAGNOSIS — I509 Heart failure, unspecified: Secondary | ICD-10-CM | POA: Diagnosis not present

## 2022-03-05 DIAGNOSIS — I83228 Varicose veins of left lower extremity with both ulcer of other part of lower extremity and inflammation: Secondary | ICD-10-CM | POA: Diagnosis not present

## 2022-03-05 DIAGNOSIS — I739 Peripheral vascular disease, unspecified: Secondary | ICD-10-CM | POA: Diagnosis not present

## 2022-03-06 DIAGNOSIS — E1140 Type 2 diabetes mellitus with diabetic neuropathy, unspecified: Secondary | ICD-10-CM | POA: Diagnosis not present

## 2022-03-06 DIAGNOSIS — E1165 Type 2 diabetes mellitus with hyperglycemia: Secondary | ICD-10-CM | POA: Diagnosis not present

## 2022-03-07 ENCOUNTER — Other Ambulatory Visit (HOSPITAL_COMMUNITY): Payer: Self-pay

## 2022-03-07 ENCOUNTER — Ambulatory Visit (INDEPENDENT_AMBULATORY_CARE_PROVIDER_SITE_OTHER): Payer: Medicare Other | Admitting: Podiatry

## 2022-03-07 DIAGNOSIS — L84 Corns and callosities: Secondary | ICD-10-CM

## 2022-03-07 DIAGNOSIS — B351 Tinea unguium: Secondary | ICD-10-CM

## 2022-03-07 DIAGNOSIS — I739 Peripheral vascular disease, unspecified: Secondary | ICD-10-CM | POA: Diagnosis not present

## 2022-03-07 DIAGNOSIS — M79674 Pain in right toe(s): Secondary | ICD-10-CM

## 2022-03-07 DIAGNOSIS — M79675 Pain in left toe(s): Secondary | ICD-10-CM

## 2022-03-11 ENCOUNTER — Encounter: Payer: Self-pay | Admitting: Podiatry

## 2022-03-11 DIAGNOSIS — I83028 Varicose veins of left lower extremity with ulcer other part of lower leg: Secondary | ICD-10-CM | POA: Diagnosis not present

## 2022-03-11 DIAGNOSIS — L97222 Non-pressure chronic ulcer of left calf with fat layer exposed: Secondary | ICD-10-CM | POA: Diagnosis not present

## 2022-03-11 DIAGNOSIS — L739 Follicular disorder, unspecified: Secondary | ICD-10-CM | POA: Diagnosis not present

## 2022-03-11 DIAGNOSIS — B9689 Other specified bacterial agents as the cause of diseases classified elsewhere: Secondary | ICD-10-CM | POA: Diagnosis not present

## 2022-03-11 DIAGNOSIS — I739 Peripheral vascular disease, unspecified: Secondary | ICD-10-CM | POA: Diagnosis not present

## 2022-03-11 DIAGNOSIS — I509 Heart failure, unspecified: Secondary | ICD-10-CM | POA: Diagnosis not present

## 2022-03-11 DIAGNOSIS — Z95820 Peripheral vascular angioplasty status with implants and grafts: Secondary | ICD-10-CM | POA: Diagnosis not present

## 2022-03-11 DIAGNOSIS — I83022 Varicose veins of left lower extremity with ulcer of calf: Secondary | ICD-10-CM | POA: Diagnosis not present

## 2022-03-11 DIAGNOSIS — Z7901 Long term (current) use of anticoagulants: Secondary | ICD-10-CM | POA: Diagnosis not present

## 2022-03-11 DIAGNOSIS — E1165 Type 2 diabetes mellitus with hyperglycemia: Secondary | ICD-10-CM | POA: Diagnosis not present

## 2022-03-11 DIAGNOSIS — B957 Other staphylococcus as the cause of diseases classified elsewhere: Secondary | ICD-10-CM | POA: Diagnosis not present

## 2022-03-11 DIAGNOSIS — Z95828 Presence of other vascular implants and grafts: Secondary | ICD-10-CM | POA: Diagnosis not present

## 2022-03-11 DIAGNOSIS — I4891 Unspecified atrial fibrillation: Secondary | ICD-10-CM | POA: Diagnosis not present

## 2022-03-11 DIAGNOSIS — I83029 Varicose veins of left lower extremity with ulcer of unspecified site: Secondary | ICD-10-CM | POA: Diagnosis not present

## 2022-03-11 DIAGNOSIS — L97822 Non-pressure chronic ulcer of other part of left lower leg with fat layer exposed: Secondary | ICD-10-CM | POA: Diagnosis not present

## 2022-03-11 DIAGNOSIS — R600 Localized edema: Secondary | ICD-10-CM | POA: Diagnosis not present

## 2022-03-11 DIAGNOSIS — I83892 Varicose veins of left lower extremities with other complications: Secondary | ICD-10-CM | POA: Diagnosis not present

## 2022-03-11 NOTE — Progress Notes (Signed)
  Subjective:  Patient ID: Harold [name], male    DOB: [birth year],  MRN: [id number]  Chief Complaint  Patient presents with   Nail Problem    Thick painful toenails, 3 month follow up    67 y.o. male returns with the above complaint. History confirmed with patient.  Nails and calluses are thickened again.  He has an upcoming cardiac ablation  Objective:  Physical Exam: no trophic changes or ulcerative lesions, reduced sensation at plantar pulps of toes and feet, large spider veins, varicosities and torturous veins in the lower legs along with venous stasis dermatitis noted and +1 PT and DP pulses bilaterally, both feet are warm and well-perfused.  Dry skin and fissuring plantar heel bilateral.  No ulcerations Left Foot: Moderate to severe hallux valgus with semirigid hammertoes present, mycotic nails x5,   Callus on medial hallux Right Foot: Mild to moderate hallux valgus with semirigid hammertoes present, mycotic nails since 5.  There is a callus on the medial hallux. right dorsal fifth PIPJ ulcer has healed with overlying hyperkeratosis.       Assessment:   1. PAD (peripheral artery disease) ([place])   2. Corns and callus   3. Pain due to onychomycosis of toenails of both feet        Plan:  Patient was evaluated and treated and all questions answered.     Patient educated on diabetes. Discussed proper diabetic foot care and discussed risks and complications of disease. Educated patient in depth on reasons to return to the office immediately should he/she discover anything concerning or new on the feet. All questions answered. Discussed proper shoes as well.   All symptomatic hyperkeratoses were safely debrided with a sterile #15 blade to patient's level of comfort without incident. We discussed preventative and palliative care of these lesions including supportive and accommodative shoegear, padding, prefabricated and custom molded accommodative orthoses, use of a pumice  stone and lotions/creams daily.  Discussed the etiology and treatment options for the condition in detail with the patient. Educated patient on the topical and oral treatment options for mycotic nails. Recommended debridement of the nails today. Sharp and mechanical debridement performed of all painful and mycotic nails today. Nails debrided in length and thickness using a nail nipper to level of comfort. Discussed treatment options including appropriate shoe gear. Follow up as needed for painful nails.     Return in about 9 weeks (around 05/09/2022) for at risk diabetic foot care.

## 2022-03-12 ENCOUNTER — Ambulatory Visit (HOSPITAL_COMMUNITY)
Admission: RE | Admit: 2022-03-12 | Discharge: 2022-03-12 | Disposition: A | Discharge status: Home / Self Care | Payer: Medicare Other | Source: Ambulatory Visit | Attending: Physician Assistant | Admitting: Physician Assistant

## 2022-03-12 DIAGNOSIS — I4819 Other persistent atrial fibrillation: Secondary | ICD-10-CM | POA: Insufficient documentation

## 2022-03-12 DIAGNOSIS — Z006 Encounter for examination for normal comparison and control in clinical research program: Secondary | ICD-10-CM

## 2022-03-12 LAB — CBC
HCT: 48.9 % (ref 39.0–52.0)
Hemoglobin: 16.2 g/dL (ref 13.0–17.0)
MCH: 30.6 pg (ref 26.0–34.0)
MCHC: 33.1 g/dL (ref 30.0–36.0)
MCV: 92.3 fL (ref 80.0–100.0)
Platelets: 211 10*3/uL (ref 150–400)
RBC: 5.3 MIL/uL (ref 4.22–5.81)
RDW: 13.6 % (ref 11.5–15.5)
WBC: 7.2 10*3/uL (ref 4.0–10.5)
nRBC: 0 % (ref 0.0–0.2)

## 2022-03-12 LAB — BASIC METABOLIC PANEL
Anion gap: 11 (ref 5–15)
BUN: 24 mg/dL — ABNORMAL HIGH (ref 8–23)
CO2: 27 mmol/L (ref 22–32)
Calcium: 8.8 mg/dL — ABNORMAL LOW (ref 8.9–10.3)
Chloride: 99 mmol/L (ref 98–111)
Creatinine, Ser: 1.9 mg/dL — ABNORMAL HIGH (ref 0.61–1.24)
GFR, Estimated: 38 mL/min — ABNORMAL LOW (ref >60)
Glucose, Bld: 113 mg/dL — ABNORMAL HIGH (ref 70–99)
Potassium: 5.5 mmol/L — ABNORMAL HIGH (ref 3.5–5.1)
Sodium: 137 mmol/L (ref 135–145)

## 2022-03-12 NOTE — Progress Notes (Signed)
Patient presents for ECG and lab work for [facility]. ECG shows:  Afib Vent. rate 111 BPM PR interval * ms QRS duration 100 ms QT/QTcB 342/465 ms  Will continue with plan for DCCV tomorrow. Follow up in the AF clinic as scheduled.

## 2022-03-12 NOTE — Research (Signed)
[facility] Informed Consent   Subject Name: Harold [name]  Subject met inclusion and exclusion criteria.  The informed consent form, study requirements and expectations were reviewed with the subject and questions and concerns were addressed prior to the signing of the consent form.  The subject verbalized understanding of the trial requirements.  The subject agreed to participate in the [facility] trial and signed the informed consent on 23/Jan/2024. The informed consent was obtained prior to performance of any protocol-specific procedures for the subject.  A copy of the signed informed consent was given to the subject and a copy was placed in the subject's medical record.   Tori Milks Elvenia Godden

## 2022-03-12 NOTE — Anesthesia Preprocedure Evaluation (Addendum)
Anesthesia Evaluation  Patient identified by MRN, date of birth, ID band Patient awake    Reviewed: Allergy & Precautions, NPO status , Patient's Chart, lab work & pertinent test results  History of Anesthesia Complications (+) PONV and history of anesthetic complications  Airway Mallampati: III  TM Distance: >3 FB Neck ROM: Full    Dental no notable dental hx. (+) Teeth Intact, Dental Advisory Given   Pulmonary sleep apnea , COPD, former smoker   Pulmonary exam normal breath sounds clear to auscultation       Cardiovascular hypertension, pulmonary hypertension+ angina  + CAD, + Cardiac Stents and + Peripheral Vascular Disease  Normal cardiovascular exam+ dysrhythmias (eliquis) Atrial Fibrillation  Rhythm:Irregular Rate:Normal  TTE 2024  1. EF challenging to assess with afib/ challenging windows. Left  ventricular ejection fraction, by estimation, is 50 to 55%. The left  ventricle has low normal function. The left ventricle demonstrates global  hypokinesis. There is mild concentric left  ventricular hypertrophy. Left ventricular diastolic parameters are  indeterminate.   2. Right ventricular systolic function is normal. The right ventricular  size is normal.   3. Left atrial size was mildly dilated.   4. Trivial mitral valve regurgitation.   5. There is mild calcification of the aortic valve. Aortic valve  regurgitation is not visualized.   6. There is mild dilatation of the ascending aorta, measuring 36 mm.   7. The inferior vena cava is normal in size with greater than 50%  respiratory variability, suggesting right atrial pressure of 3 mmHg.     Neuro/Psych  PSYCHIATRIC DISORDERS Anxiety Depression    negative neurological ROS     GI/Hepatic Neg liver ROS,GERD  ,,  Endo/Other  diabetes, Type 2, Insulin DependentHypothyroidism    Renal/GU Renal InsufficiencyRenal disease  negative genitourinary    Musculoskeletal negative musculoskeletal ROS (+)    Abdominal   Peds  Hematology negative hematology ROS (+)   Anesthesia Other Findings   Reproductive/Obstetrics                             Anesthesia Physical Anesthesia Plan  ASA: 3  Anesthesia Plan: MAC and General   Post-op Pain Management:    Induction: Intravenous  PONV Risk Score and Plan: Propofol infusion and Treatment may vary due to age or medical condition  Airway Management Planned: Natural Airway  Additional Equipment:   Intra-op Plan:   Post-operative Plan:   Informed Consent: I have reviewed the patients History and Physical, chart, labs and discussed the procedure including the risks, benefits and alternatives for the proposed anesthesia with the patient or authorized representative who has indicated his/her understanding and acceptance.     Dental advisory given  Plan Discussed with: CRNA  Anesthesia Plan Comments:        Anesthesia Quick Evaluation

## 2022-03-13 ENCOUNTER — Other Ambulatory Visit: Payer: Self-pay

## 2022-03-13 ENCOUNTER — Encounter (HOSPITAL_COMMUNITY)
Admission: RE | Disposition: A | Discharge status: Home / Self Care | Payer: Self-pay | Source: Ambulatory Visit | Attending: Cardiology

## 2022-03-13 ENCOUNTER — Ambulatory Visit (HOSPITAL_COMMUNITY): Payer: Medicare Other | Admitting: Anesthesiology

## 2022-03-13 ENCOUNTER — Ambulatory Visit (HOSPITAL_COMMUNITY)
Admission: RE | Admit: 2022-03-13 | Discharge: 2022-03-13 | Disposition: A | Discharge status: Home / Self Care | Payer: Medicare Other | Source: Ambulatory Visit | Attending: Cardiology | Admitting: Cardiology

## 2022-03-13 ENCOUNTER — Encounter (HOSPITAL_COMMUNITY): Payer: Self-pay | Admitting: Cardiology

## 2022-03-13 ENCOUNTER — Ambulatory Visit (HOSPITAL_BASED_OUTPATIENT_CLINIC_OR_DEPARTMENT_OTHER): Payer: Medicare Other | Admitting: Anesthesiology

## 2022-03-13 DIAGNOSIS — Z6841 Body Mass Index (BMI) 40.0 and over, adult: Secondary | ICD-10-CM | POA: Insufficient documentation

## 2022-03-13 DIAGNOSIS — I700 Atherosclerosis of aorta: Secondary | ICD-10-CM | POA: Insufficient documentation

## 2022-03-13 DIAGNOSIS — J449 Chronic obstructive pulmonary disease, unspecified: Secondary | ICD-10-CM | POA: Insufficient documentation

## 2022-03-13 DIAGNOSIS — I493 Ventricular premature depolarization: Secondary | ICD-10-CM | POA: Insufficient documentation

## 2022-03-13 DIAGNOSIS — E119 Type 2 diabetes mellitus without complications: Secondary | ICD-10-CM | POA: Insufficient documentation

## 2022-03-13 DIAGNOSIS — I2510 Atherosclerotic heart disease of native coronary artery without angina pectoris: Secondary | ICD-10-CM | POA: Diagnosis not present

## 2022-03-13 DIAGNOSIS — I739 Peripheral vascular disease, unspecified: Secondary | ICD-10-CM | POA: Diagnosis not present

## 2022-03-13 DIAGNOSIS — I4819 Other persistent atrial fibrillation: Secondary | ICD-10-CM | POA: Diagnosis not present

## 2022-03-13 DIAGNOSIS — I5032 Chronic diastolic (congestive) heart failure: Secondary | ICD-10-CM | POA: Diagnosis not present

## 2022-03-13 DIAGNOSIS — I119 Hypertensive heart disease without heart failure: Secondary | ICD-10-CM

## 2022-03-13 DIAGNOSIS — D6869 Other thrombophilia: Secondary | ICD-10-CM | POA: Diagnosis not present

## 2022-03-13 DIAGNOSIS — K219 Gastro-esophageal reflux disease without esophagitis: Secondary | ICD-10-CM | POA: Insufficient documentation

## 2022-03-13 DIAGNOSIS — Z955 Presence of coronary angioplasty implant and graft: Secondary | ICD-10-CM | POA: Diagnosis not present

## 2022-03-13 DIAGNOSIS — E039 Hypothyroidism, unspecified: Secondary | ICD-10-CM | POA: Diagnosis not present

## 2022-03-13 DIAGNOSIS — Z794 Long term (current) use of insulin: Secondary | ICD-10-CM | POA: Insufficient documentation

## 2022-03-13 DIAGNOSIS — G4733 Obstructive sleep apnea (adult) (pediatric): Secondary | ICD-10-CM | POA: Diagnosis not present

## 2022-03-13 DIAGNOSIS — Z87891 Personal history of nicotine dependence: Secondary | ICD-10-CM | POA: Insufficient documentation

## 2022-03-13 DIAGNOSIS — I25119 Atherosclerotic heart disease of native coronary artery with unspecified angina pectoris: Secondary | ICD-10-CM | POA: Diagnosis not present

## 2022-03-13 DIAGNOSIS — I4891 Unspecified atrial fibrillation: Secondary | ICD-10-CM | POA: Diagnosis not present

## 2022-03-13 DIAGNOSIS — E669 Obesity, unspecified: Secondary | ICD-10-CM | POA: Diagnosis not present

## 2022-03-13 DIAGNOSIS — E1151 Type 2 diabetes mellitus with diabetic peripheral angiopathy without gangrene: Secondary | ICD-10-CM | POA: Diagnosis not present

## 2022-03-13 DIAGNOSIS — I10 Essential (primary) hypertension: Secondary | ICD-10-CM | POA: Diagnosis not present

## 2022-03-13 HISTORY — PX: CARDIOVERSION: SHX1299

## 2022-03-13 LAB — GLUCOSE, CAPILLARY: Glucose-Capillary: 109 mg/dL — ABNORMAL HIGH (ref 70–99)

## 2022-03-13 SURGERY — CARDIOVERSION
Anesthesia: General

## 2022-03-13 MED ORDER — LIDOCAINE 2% (20 MG/ML) 5 ML SYRINGE
INTRAMUSCULAR | Status: DC | PRN
  Administered 2022-03-13: 100 mg via INTRAVENOUS

## 2022-03-13 MED ORDER — SODIUM CHLORIDE 0.9 % IV SOLN: INTRAVENOUS | Status: DC

## 2022-03-13 MED ORDER — PROPOFOL 10 MG/ML IV BOLUS
INTRAVENOUS | Status: DC | PRN
  Administered 2022-03-13: 100 mg via INTRAVENOUS

## 2022-03-13 NOTE — Transfer of Care (Signed)
Immediate Anesthesia Transfer of Care Note  Patient: Harold [name]  Procedure(s) Performed: CARDIOVERSION  Patient Location: Endoscopy Unit  Anesthesia Type:General  Level of Consciousness: drowsy  Airway & Oxygen Therapy: Patient Spontanous Breathing  Post-op Assessment: Report given to RN and Post -op Vital signs reviewed and stable  Post vital signs: Reviewed and stable  Last Vitals:  Vitals Value Taken Time  BP    Temp    Pulse    Resp    SpO2      Last Pain:  Vitals:   03/13/22 0850  TempSrc: Temporal  PainSc: 0-No pain         Complications: No notable events documented.

## 2022-03-13 NOTE — Anesthesia Procedure Notes (Signed)
Procedure Name: General with mask airway Date/Time: 03/13/2022 9:45 AM  Performed by: Valda Favia, CRNAPre-anesthesia Checklist: Patient identified, Emergency Drugs available, Suction available, Patient being monitored and Timeout performed Patient Re-evaluated:Patient Re-evaluated prior to induction Oxygen Delivery Method: Ambu bag Preoxygenation: Pre-oxygenation with 100% oxygen Induction Type: IV induction Placement Confirmation: positive ETCO2 Dental Injury: Teeth and Oropharynx as per pre-operative assessment

## 2022-03-13 NOTE — Discharge Instructions (Signed)
Electrical Cardioversion °Electrical cardioversion is the delivery of a jolt of electricity to restore a normal rhythm to the heart. A rhythm that is too fast or is not regular keeps the heart from pumping well. In this procedure, sticky patches or metal paddles are placed on the chest to deliver electricity to the heart from a device. °This procedure may be done in an emergency if: °There is low or no blood pressure as a result of the heart rhythm. °Normal rhythm must be restored as fast as possible to protect the brain and heart from further damage. °It may save a life. °This may also be a scheduled procedure for irregular or fast heart rhythms that are not immediately life-threatening. ° °What can I expect after the procedure? °Your blood pressure, heart rate, breathing rate, and blood oxygen level will be monitored until you leave the hospital or clinic. °Your heart rhythm will be watched to make sure it does not change. °You may have some redness on the skin where the shocks were given. Over the counter cortizone cream may be helpful.  °Follow these instructions at home: °Do not drive for 24 hours if you were given a sedative during your procedure. °Take over-the-counter and prescription medicines only as told by your health care provider. °Ask your health care provider how to check your pulse. Check it often. °Rest for 48 hours after the procedure or as told by your health care provider. °Avoid or limit your caffeine use as told by your health care provider. °Keep all follow-up visits as told by your health care provider. This is important. °Contact a health care provider if: °You feel like your heart is beating too quickly or your pulse is not regular. °You have a serious muscle cramp that does not go away. °Get help right away if: °You have discomfort in your chest. °You are dizzy or you feel faint. °You have trouble breathing or you are short of breath. °Your speech is slurred. °You have trouble moving an  arm or leg on one side of your body. °Your fingers or toes turn cold or blue. °Summary °Electrical cardioversion is the delivery of a jolt of electricity to restore a normal rhythm to the heart. °This procedure may be done right away in an emergency or may be a scheduled procedure if the condition is not an emergency. °Generally, this is a safe procedure. °After the procedure, check your pulse often as told by your health care provider. °This information is not intended to replace advice given to you by your health care provider. Make sure you discuss any questions you have with your health care provider. °Document Revised: 09/07/2018 Document Reviewed: 09/07/2018 °Elsevier Patient Education © 2020 Elsevier Inc.  °

## 2022-03-13 NOTE — Interval H&P Note (Signed)
History and Physical Interval Note:  03/13/2022 9:27 AM  Harold [name]  has presented today for surgery, with the diagnosis of AFIB.  The various methods of treatment have been discussed with the patient and family. After consideration of risks, benefits and other options for treatment, the patient has consented to  Procedure(s): CARDIOVERSION (N/A) as a surgical intervention.  The patient's history has been reviewed, patient examined, no change in status, stable for surgery.  I have reviewed the patient's chart and labs.  Questions were answered to the patient's satisfaction.     Donato Heinz

## 2022-03-13 NOTE — Anesthesia Postprocedure Evaluation (Signed)
Anesthesia Post Note  Patient: Harold [name]  Procedure(s) Performed: CARDIOVERSION     Patient location during evaluation: Endoscopy Anesthesia Type: General Level of consciousness: awake and alert Pain management: pain level controlled Vital Signs Assessment: post-procedure vital signs reviewed and stable Respiratory status: spontaneous breathing, nonlabored ventilation, respiratory function stable and patient connected to nasal cannula oxygen Cardiovascular status: blood pressure returned to baseline and stable Postop Assessment: no apparent nausea or vomiting Anesthetic complications: no  No notable events documented.  Last Vitals:  Vitals:   03/13/22 1010 03/13/22 1020  BP: (!) 162/94 (!) 146/80  Pulse: 85 87  Resp: 16 (!) 22  Temp:    SpO2: 99% 97%    Last Pain:  Vitals:   03/13/22 1020  TempSrc:   PainSc: 0-No pain                 Brinkley Peet L Mont Jagoda

## 2022-03-13 NOTE — CV Procedure (Signed)
Procedure:   DCCV  Indication:  Symptomatic atrial fibrillation  Procedure Note:  The patient signed informed consent.  They have had had therapeutic anticoagulation with Eliquis greater than 3 weeks.  Anesthesia was administered by Dr. Lanetta Inch and Edmonia James, CRNA.  Adequate airway was maintained throughout and vital followed per protocol.  They were cardioverted x 2 with 200J of biphasic synchronized energy.  After first 200J shock, remained in Afib.  For second 200J shock, pressure was applied to anterior pad and patient converted to normal sinus rhythm with rate 70-80s.  There were no apparent complications.  The patient had normal neuro status and respiratory status post procedure with vitals stable as recorded elsewhere.    Follow up:  They will continue on current medical therapy and follow up with cardiology as scheduled.  Oswaldo Milian, MD 03/13/2022 9:48 AM

## 2022-03-14 ENCOUNTER — Encounter (HOSPITAL_COMMUNITY): Payer: Self-pay | Admitting: Cardiology

## 2022-03-22 DIAGNOSIS — G4733 Obstructive sleep apnea (adult) (pediatric): Secondary | ICD-10-CM | POA: Diagnosis not present

## 2022-03-25 ENCOUNTER — Ambulatory Visit (HOSPITAL_BASED_OUTPATIENT_CLINIC_OR_DEPARTMENT_OTHER): Payer: Medicare Other | Admitting: Internal Medicine

## 2022-03-26 DIAGNOSIS — R600 Localized edema: Secondary | ICD-10-CM | POA: Diagnosis not present

## 2022-03-26 DIAGNOSIS — I83228 Varicose veins of left lower extremity with both ulcer of other part of lower extremity and inflammation: Secondary | ICD-10-CM | POA: Diagnosis not present

## 2022-03-26 DIAGNOSIS — L97929 Non-pressure chronic ulcer of unspecified part of left lower leg with unspecified severity: Secondary | ICD-10-CM | POA: Diagnosis not present

## 2022-03-26 DIAGNOSIS — I83892 Varicose veins of left lower extremities with other complications: Secondary | ICD-10-CM | POA: Diagnosis not present

## 2022-03-26 DIAGNOSIS — L739 Follicular disorder, unspecified: Secondary | ICD-10-CM | POA: Diagnosis not present

## 2022-03-26 DIAGNOSIS — A319 Mycobacterial infection, unspecified: Secondary | ICD-10-CM | POA: Diagnosis not present

## 2022-03-26 DIAGNOSIS — L97821 Non-pressure chronic ulcer of other part of left lower leg limited to breakdown of skin: Secondary | ICD-10-CM | POA: Diagnosis not present

## 2022-03-27 ENCOUNTER — Ambulatory Visit (HOSPITAL_COMMUNITY)
Admission: RE | Admit: 2022-03-27 | Discharge: 2022-03-27 | Disposition: A | Discharge status: Home / Self Care | Payer: Medicare Other | Source: Ambulatory Visit | Attending: Physician Assistant | Admitting: Physician Assistant

## 2022-03-27 ENCOUNTER — Encounter (HOSPITAL_COMMUNITY): Payer: Self-pay | Admitting: Physician Assistant

## 2022-03-27 VITALS — BP 118/82 | HR 98 | Ht 73.0 in | Wt 325.0 lb

## 2022-03-27 DIAGNOSIS — Z6841 Body Mass Index (BMI) 40.0 and over, adult: Secondary | ICD-10-CM | POA: Insufficient documentation

## 2022-03-27 DIAGNOSIS — I129 Hypertensive chronic kidney disease with stage 1 through stage 4 chronic kidney disease, or unspecified chronic kidney disease: Secondary | ICD-10-CM | POA: Insufficient documentation

## 2022-03-27 DIAGNOSIS — E1122 Type 2 diabetes mellitus with diabetic chronic kidney disease: Secondary | ICD-10-CM | POA: Insufficient documentation

## 2022-03-27 DIAGNOSIS — G4733 Obstructive sleep apnea (adult) (pediatric): Secondary | ICD-10-CM | POA: Diagnosis not present

## 2022-03-27 DIAGNOSIS — I739 Peripheral vascular disease, unspecified: Secondary | ICD-10-CM | POA: Diagnosis not present

## 2022-03-27 DIAGNOSIS — I4819 Other persistent atrial fibrillation: Secondary | ICD-10-CM | POA: Diagnosis not present

## 2022-03-27 DIAGNOSIS — Z79899 Other long term (current) drug therapy: Secondary | ICD-10-CM | POA: Diagnosis not present

## 2022-03-27 DIAGNOSIS — D6869 Other thrombophilia: Secondary | ICD-10-CM | POA: Insufficient documentation

## 2022-03-27 DIAGNOSIS — Z7901 Long term (current) use of anticoagulants: Secondary | ICD-10-CM | POA: Diagnosis not present

## 2022-03-27 DIAGNOSIS — I2510 Atherosclerotic heart disease of native coronary artery without angina pectoris: Secondary | ICD-10-CM | POA: Insufficient documentation

## 2022-03-27 DIAGNOSIS — E1169 Type 2 diabetes mellitus with other specified complication: Secondary | ICD-10-CM | POA: Diagnosis not present

## 2022-03-27 DIAGNOSIS — E785 Hyperlipidemia, unspecified: Secondary | ICD-10-CM | POA: Diagnosis not present

## 2022-03-27 DIAGNOSIS — E669 Obesity, unspecified: Secondary | ICD-10-CM | POA: Insufficient documentation

## 2022-03-27 DIAGNOSIS — N182 Chronic kidney disease, stage 2 (mild): Secondary | ICD-10-CM | POA: Insufficient documentation

## 2022-03-27 LAB — BASIC METABOLIC PANEL
Anion gap: 14 (ref 5–15)
BUN: 23 mg/dL (ref 8–23)
CO2: 27 mmol/L (ref 22–32)
Calcium: 9.1 mg/dL (ref 8.9–10.3)
Chloride: 100 mmol/L (ref 98–111)
Creatinine, Ser: 1.99 mg/dL — ABNORMAL HIGH (ref 0.61–1.24)
GFR, Estimated: 36 mL/min — ABNORMAL LOW (ref >60)
Glucose, Bld: 174 mg/dL — ABNORMAL HIGH (ref 70–99)
Potassium: 4.3 mmol/L (ref 3.5–5.1)
Sodium: 141 mmol/L (ref 135–145)

## 2022-03-27 NOTE — Progress Notes (Signed)
Primary Care Physician: Deland Pretty, MD Primary Cardiologist: Dr Ellyn Hack  Primary Electrophysiologist: none Referring Physician: Dr Fredderick Severance is a 67 y.o. male with a history of carotid disease, obstructive sleep apnea on CPAP, peripheral arterial disease, essential hypertension, obesity, chronic kidney disease, type 2 diabetes, CAD, hyperlipidemia, atrial fibrillation who presents for follow up in [facility].  The patient was initially diagnosed with atrial fibrillation 01/28/22 at his visit with Dr Ellyn Hack. Patient had noticed more fatigue with exertion for the previous two months but was otherwise unaware of his arrhythmia. He was started on Eliquis for a CHADS2VASC score of 4. An echo and cardiac monitor were also ordered. He denies significant alcohol use and is compliant with his CPAP. The cardiac monitor showed 100% afib burden.  On follow up today, patient is s/p DCCV on 03/13/22. Patient reports that after his DCCV he felt less SOB. He was surprised to hear he was back in afib today. There were no specific triggers that he could identify.   Today, he denies symptoms of palpitations, chest pain, shortness of breath, orthopnea, PND, lower extremity edema, dizziness, presyncope, syncope, bleeding, or neurologic sequela. The patient is tolerating medications without difficulties and is otherwise without complaint today.    Atrial Fibrillation Risk Factors:  he does have symptoms or diagnosis of sleep apnea. he is compliant with CPAP therapy. he does not have a history of rheumatic fever. he does not have a history of alcohol use.   he has a BMI of Body mass index is 42.88 kg/m.Marland Kitchen Filed Weights   03/27/22 1051  Weight: (!) 147.4 kg    Family History  Adopted: Yes  Family history unknown: Yes     Atrial Fibrillation Management history:  Previous antiarrhythmic drugs: none Previous cardioversions: 03/13/22 Previous  ablations: none CHADS2VASC score: 4 Anticoagulation history: Eliquis   Past Medical History:  Diagnosis Date   Anginal pain [facility]) March 2015   Cardiac cath showed patent stents with distal LAD, circumflex-OM and RCA disease in small vessels.   Anxiety    CAD S/P percutaneous coronary angioplasty 2003, 04/2012   status post PCI to LAD, circumflex-OM 2, RCA   Carotid artery occlusion    Chronic renal insufficiency, stage II (mild)    Chronic venous insufficiency    varicosities, no reflux; dopplers 04/14/12- valvular insufficiency in the R and L GSV   Complication of anesthesia    COPD, mild (HCC)    Depression    situaltional    Diabetes mellitus    Diabetic neuropathy ([place]) 08/24/2019   Dyslipidemia associated with type 2 diabetes mellitus (HCC)    GERD (gastroesophageal reflux disease)    HTN (hypertension)    Hypothyroidism    Neuromuscular disorder (HCC)    neuropathy in feet   Neuropathy    notably improved following PCI with improved cardiac function   Obesity    Obesity, Class II, BMI 35.0-39.9, with comorbidity (see actual BMI)    BMI 39; wgt loss efforts in place; seeing Dietician   PONV (postoperative nausea and vomiting)    "[employer] Works"   Pulmonary hypertension ([place]) 04/2012   PA pressure 80mhg   Sleep apnea    uses nightly   Past Surgical History:  Procedure Laterality Date   ABDOMINAL AORTAGRAM N/A 11/15/2011   Procedure: ABDOMINAL AMaxcine Ham  Surgeon: CElam Dutch MD;  [facility];  Service: Cardiovascular;  [name];   ABDOMINAL AORTOGRAM  W/LOWER EXTREMITY N/A 10/13/2019   Procedure: ABDOMINAL AORTOGRAM W/LOWER EXTREMITY;  Surgeon: Wellington Hampshire, MD;  Location: [place] CV LAB;  Service: CV: Non-obst Ao-Iliac. R SFA-PopA patent with significant calcific stenosis of TP trunk-prox PTA (dominant) & CTO ATA  -> R PTA-TP PTCA   ABIs  04/27/2012   mild bilateral arterial insufficiency   BACK SURGERY  2005 x1   X2-2010   BUNIONECTOMY  Right 11/11/2013   Procedure: RIGHT FOOT SILVER BUNIONECTOMY;  Surgeon: Wylene Simmer, MD;  Location: [place];  Service: Orthopedics;  Laterality: Right;   CARDIAC CATHETERIZATION  12/11/2001   significant 3V CAD, normal LV function   CARDIOVERSION N/A 03/13/2022   Procedure: CARDIOVERSION;  Surgeon: Donato Heinz, MD;  Location[facility] ENDOSCOPY;  Service: Cardiovascular;  [name];   Carotid Doppler  04/27/2012   right internal carotid: Elevated velocities but no evidence of plaque. Left internal carotid 40-59%   CAROTID ENDARTERECTOMY  2005   Right; recent carotid Dopplers notes elevated velocities.    colonscopy     CORONARY ANGIOPLASTY  12/21/2001   PTCA of the distal and mid AV groove circ, unsuccessful PTCA of second OM total occlusion, unsuccessful PTCA of the apical LAD total occlusion   CORONARY ANGIOPLASTY WITH STENT PLACEMENT  04/29/2012   PCI to 3 RCA lesions, Promus Premiere 2.63mx8mm distally, mid was 2.528m8mm and proximally 2.75x1612mEF 55-60%   CORONARY STENT PLACEMENT  04/28/2012   PCI to LAD (3x23m21mence DES postdilated to 3.25) and circ prox and mid (2 overlappinmg 2.5mmx24mmXience DES postdilated to 2.75mm)65mOPPLER ECHOCARDIOGRAPHY  04/28/2012   poor quality study: EF estimated 60-65%; unable to assess diastolic function (previously noted to have diastolic dysfunction); severely dilated left atrium and mild right atrium; dilated IVC consistent with increased central venous pressure..   HEMarland KitchenNIA REPAIR     LEFT AND RIGHT HEART CATHETERIZATION WITH CORONARY ANGIOGRAM N/A 04/27/2012   Procedure: LEFT AND RIGHT HEART CATHETERIZATION WITH CORONARY ANGIOGRAM;  Surgeon: David Leonie Man [facility];  Service: Cardiovascular;  [name];   LEFT AND RIGHT HEART CATHETERIZATION WITH CORONARY ANGIOGRAM N/A 05/14/2013   Procedure: LEFT AND RIGHT HEART CATHETERIZATION WITH CORONARY ANGIOGRAM;  Surgeon: ThomasTroy Sine [facility] Moderate Pulm HTN: 46/16 - mean 33 mmHg; PCWP 22mmHg99[name], [name], Patent RCA ostial mid and distal stents - 70% distal runoff Dz   LUMBAR LAMINECTOMY/DECOMPRESSION MICRODISCECTOMY  01/07/2012   Procedure: LUMBAR LAMINECTOMY/DECOMPRESSION MICRODISCECTOMY 1 LEVEL;  Surgeon: Kyle L Winfield CunasLocation: [facility];  Service: Neurosurgery;  Laterality: Bilateral;   Lumbar Three-Four Decompression   LUMBAR LAMINECTOMY/DECOMPRESSION MICRODISCECTOMY N/A 07/26/2020   Procedure: Lumbar two-three Laminectomy/Foraminotomy;  Surgeon: CabbellAshok PallLocation: [facility] OR; [place]: [place]surgery;  [name];   PERCUTANEOUS CORONARY STENT INTERVENTION (PCI-S) N/A 04/28/2012   Procedure: PERCUTANEOUS CORONARY STENT INTERVENTION (PCI-S);  Surgeon: Thomas Troy SineLocation[facility];  Service: Cardiovascular;  [name];   PERCUTANEOUS CORONARY STENT INTERVENTION (PCI-S) N/A 04/29/2012   Procedure: PERCUTANEOUS CORONARY STENT INTERVENTION (PCI-S);  Surgeon: David WLeonie ManLocation[facility];  Service: Cardiovascular;  [name];   PERIPHERAL VASCULAR BALLOON ANGIOPLASTY Right 10/13/2019   Procedure: PERIPHERAL VASCULAR BALLOON ANGIOPLASTY;  Surgeon: Arida, Wellington HampshireLocation[facility];  Service: Cardiovascular;  Laterality:  Right;  PTCA of R TP Trunk into PTA (Drug-coated) => Follow-up ABIs 0.9 on the right and 0.99 on the left.   SPINE SURGERY     UMBILICAL HERNIA REPAIR  2009   steel mesh insert    Current Outpatient Medications  Medication Sig Dispense Refill   allopurinol (ZYLOPRIM) 100 MG tablet Take 100-150 mg by mouth See admin instructions. Take 100 mg in the morning and 150 mg at night     apixaban (ELIQUIS) 5 MG TABS tablet Take 1 tablet (5 mg total) by mouth 2 (two) times daily. 180 tablet 2   ARIPiprazole (ABILIFY) 5 MG tablet Take 5  mg by mouth daily.     B-D ULTRAFINE III SHORT PEN 31G X 8 MM MISC Inject 1 each into the skin 3 (three) times daily.     carvedilol (COREG) 12.5 MG tablet TAKE 1 TABLET (12.5 MG TOTAL) BY MOUTH 2 (TWO) TIMES DAILY WITH A MEAL. (Patient taking differently: Take 6.25 mg by mouth 2 (two) times daily with a meal.) 180 tablet 3   COLCRYS 0.6 MG tablet Take 0.6 mg by mouth daily.     Continuous Blood Gluc Sensor (FREESTYLE LIBRE 14 DAY SENSOR) MISC APPLY 1 SENSOR EVERY 14 DAYS  11   DULoxetine (CYMBALTA) 60 MG capsule Take 60 mg by mouth daily.     FARXIGA 10 MG TABS tablet Take 10 mg by mouth daily.     furosemide (LASIX) 80 MG tablet Take 160 mg by mouth 2 (two) times daily.     gabapentin (NEURONTIN) 800 MG tablet Take 1,600 mg by mouth 3 (three) times daily.     insulin regular human CONCENTRATED (HUMULIN R U-500 KWIKPEN) 500 UNIT/ML kwikpen Inject 100-200 Units into the skin See admin instructions. Inject 100 units in the morning and 200 units at night     levothyroxine (SYNTHROID, LEVOTHROID) 88 MCG tablet Take 88 mcg by mouth daily before breakfast.     losartan (COZAAR) 25 MG tablet Take 12.5 mg by mouth daily.     meclizine (ANTIVERT) 25 MG tablet Take 25 mg by mouth 3 (three) times daily.     mupirocin ointment (BACTROBAN) 2 % APPLY TO AFFECTED AREA TWICE A DAY (Patient taking differently: Apply 1 Application topically 2 (two) times daily as needed (wound care).) 22 g 2   nitroGLYCERIN (NITROSTAT) 0.4 MG SL tablet PLACE 1 TAB UNDER THE TONGUE EVERY 5 MINUTES AS NEEDED FOR CHEST PAIN (SEVERE PRESSURE OR TIGHTNESS) 25 tablet 3   omeprazole (PRILOSEC) 20 MG capsule TAKE 1 CAPSULE BY MOUTH EVERY DAY 90 capsule 3   potassium chloride (MICRO-K) 10 MEQ CR capsule Take 10 mEq by mouth daily.   5   rosuvastatin (CRESTOR) 20 MG tablet Take 20 mg by mouth daily.     tirzepatide (MOUNJARO) 15 MG/0.5ML Pen Inject 15 mg into the skin once a week. 6 mL 3   TRESIBA FLEXTOUCH 200 UNIT/ML FlexTouch Pen Inject  84 Units into the skin daily.     No current facility-administered medications for this encounter.    Allergies  Allergen Reactions   Septra [Bactrim] Itching   Penicillins Hives    Allergy to All cillin drugs  Ancef given 9/24 with no obvious reaction   Metformin And Related Diarrhea and Nausea Only   Other Nausea And Vomiting    General Anesthesia - sometimes causes nausea and vomiting    Wellbutrin [Bupropion]     Mood Changes    Social History  Socioeconomic History   Marital status: Married    Spouse name: Not on file   Number of children: 0   Years of education: hs   Highest education level: Not on file  Occupational History   Occupation: Brewing technologist: replacement limited  Tobacco Use   Smoking status: Former    Packs/day: 1.00    Years: 42.00    Total pack years: 42.00    Types: Cigarettes    Quit date: 03/23/2003    Years since quitting: 19.0   Smokeless tobacco: Never   Tobacco comments:    Former smoker 02/19/22  Vaping Use   Vaping Use: Never used  Substance and Sexual Activity   Alcohol use: No    Alcohol/week: 0.0 standard drinks of alcohol   Drug use: No   Sexual activity: Not on file  Other Topics Concern   Not on file  Social History Narrative   He and his partner [name] have now officially gotten married on 05/26/2013.  [name] is also a patient of our clinic.  He is now initially hyphenated his last name.   He has now " retired"from his long-term employment at [employer]  As of September 2020   He does not smoke, he quit in 2009.  He does not drink alcohol.   He was adopted and no family history is known.    Social Determinants of Health   Financial Resource Strain: Not on file  Food Insecurity: Not on file  Transportation Needs: Not on file  Physical Activity: Not on file  Stress: Not on file  Social Connections: Not on file  Intimate Partner Violence: Not on file     ROS- All systems are reviewed and  negative except as per the HPI above.  Physical Exam: Vitals:   03/27/22 1051  BP: 118/82  Pulse: 98  Weight: (!) 147.4 kg  Height: '6\' 1"'$  (1.854 m)    GEN- The patient is a well appearing obese male, alert and oriented x 3 today.   HEENT-head normocephalic, atraumatic, sclera clear, conjunctiva pink, hearing intact, trachea midline. Lungs- Clear to ausculation bilaterally, normal work of breathing Heart- irregular rate and rhythm, no murmurs, rubs or gallops  GI- soft, NT, ND, + BS Extremities- no clubbing, cyanosis, or edema. L leg wrapped.  MS- no significant deformity or atrophy Skin- no rash or lesion Psych- euthymic mood, full affect Neuro- strength and sensation are intact   Wt Readings from Last 3 Encounters:  03/27/22 (!) 147.4 kg  03/13/22 (!) 148.4 kg  02/19/22 (!) 148.4 kg    EKG today demonstrates  Afib Vent rate 98 bpm QRS 104 ms QTc 459 ms  Echo 02/21/22 demonstrated   1. EF challenging to assess with afib/ challenging windows. Left  ventricular ejection fraction, by estimation, is 50 to 55%. The left  ventricle has low normal function. The left ventricle demonstrates global  hypokinesis. There is mild concentric left  ventricular hypertrophy. Left ventricular diastolic parameters are  indeterminate.   2. Right ventricular systolic function is normal. The right ventricular  size is normal.   3. Left atrial size was mildly dilated.   4. Trivial mitral valve regurgitation.   5. There is mild calcification of the aortic valve. Aortic valve  regurgitation is not visualized.   6. There is mild dilatation of the ascending aorta, measuring 36 mm.   7. The inferior vena cava is normal in size with greater than 50%  respiratory variability,  suggesting right atrial pressure of 3 mmHg.   Epic records are reviewed at length today  CHA2DS2-VASc Score = 4  The patient's score is based upon: CHF History: 0 HTN History: 1 Diabetes History: 1 Stroke History:  0 Vascular Disease History: 1 Age Score: 1 Gender Score: 0       ASSESSMENT AND PLAN: 1. Persistent Atrial Fibrillation (ICD10:  I48.19) The patient's CHA2DS2-VASc score is 4, indicating a 4.8% annual risk of stroke.   Zio showed 100% afib burden. Now s/p DCCV on 03/13/22 with quick return of afib.  We discussed rhythm control options today.  Need to avoid class IC with h/o CAD. Multaq, dofetilide, and amiodarone all interact with Abilify, d/w pharmacy previously. He is interested in ablation, will refer to EP. We did discuss that he may not be a candidate with his weight and other comorbidities. If he is not felt to be an ablation candidate, he is amenable to stopping/changing his Abilify. He would prefer amiodarone to dofetilide.  Continue carvedilol 6.25 mg BID Continue Eliquis 5 mg BID  2. Secondary Hypercoagulable State (ICD10:  D68.69) The patient is at significant risk for stroke/thromboembolism based upon his CHA2DS2-VASc Score of 4.  Continue Apixaban (Eliquis).   3. Obesity Body mass index is 42.88 kg/m. Lifestyle modification was discussed and encouraged including regular physical activity and weight reduction. His weight is steadily decreasing after starting Mounjaro.   4. Obstructive sleep apnea Encouraged compliance with CPAP therapy.  5. CAD S/p several stents No anginal symptoms.  6. PAD Followed by Dr Fletcher Anon  7. HTN Stable, no changes today.   Follow up with EP to establish care/discuss rhythm options.    [facility] [address] [place], [place] [address] [phone] 03/27/2022 11:00 AM

## 2022-04-03 DIAGNOSIS — I10 Essential (primary) hypertension: Secondary | ICD-10-CM | POA: Diagnosis not present

## 2022-04-03 DIAGNOSIS — I83029 Varicose veins of left lower extremity with ulcer of unspecified site: Secondary | ICD-10-CM | POA: Diagnosis not present

## 2022-04-03 DIAGNOSIS — I83892 Varicose veins of left lower extremities with other complications: Secondary | ICD-10-CM | POA: Diagnosis not present

## 2022-04-03 DIAGNOSIS — L97929 Non-pressure chronic ulcer of unspecified part of left lower leg with unspecified severity: Secondary | ICD-10-CM | POA: Diagnosis not present

## 2022-04-03 DIAGNOSIS — E119 Type 2 diabetes mellitus without complications: Secondary | ICD-10-CM | POA: Diagnosis not present

## 2022-04-03 DIAGNOSIS — I739 Peripheral vascular disease, unspecified: Secondary | ICD-10-CM | POA: Diagnosis not present

## 2022-04-06 DIAGNOSIS — E1165 Type 2 diabetes mellitus with hyperglycemia: Secondary | ICD-10-CM | POA: Diagnosis not present

## 2022-04-06 DIAGNOSIS — E1140 Type 2 diabetes mellitus with diabetic neuropathy, unspecified: Secondary | ICD-10-CM | POA: Diagnosis not present

## 2022-04-09 DIAGNOSIS — R600 Localized edema: Secondary | ICD-10-CM | POA: Diagnosis not present

## 2022-04-09 DIAGNOSIS — I83228 Varicose veins of left lower extremity with both ulcer of other part of lower extremity and inflammation: Secondary | ICD-10-CM | POA: Diagnosis not present

## 2022-04-09 DIAGNOSIS — A319 Mycobacterial infection, unspecified: Secondary | ICD-10-CM | POA: Diagnosis not present

## 2022-04-09 DIAGNOSIS — L97929 Non-pressure chronic ulcer of unspecified part of left lower leg with unspecified severity: Secondary | ICD-10-CM | POA: Diagnosis not present

## 2022-04-09 DIAGNOSIS — I83892 Varicose veins of left lower extremities with other complications: Secondary | ICD-10-CM | POA: Diagnosis not present

## 2022-04-09 DIAGNOSIS — L97821 Non-pressure chronic ulcer of other part of left lower leg limited to breakdown of skin: Secondary | ICD-10-CM | POA: Diagnosis not present

## 2022-04-09 DIAGNOSIS — L739 Follicular disorder, unspecified: Secondary | ICD-10-CM | POA: Diagnosis not present

## 2022-04-10 ENCOUNTER — Other Ambulatory Visit (HOSPITAL_COMMUNITY): Payer: Self-pay

## 2022-04-10 MED ORDER — MOUNJARO 12.5 MG/0.5ML ~~LOC~~ SOAJ: 12.5000 mg | SUBCUTANEOUS | 11 refills | Status: DC

## 2022-04-10 MED ORDER — MOUNJARO 12.5 MG/0.5ML ~~LOC~~ SOAJ
12.5000 mg | SUBCUTANEOUS | 2 refills | Status: DC
  Filled 2022-04-10: qty 2, 28d supply, fill #0

## 2022-04-11 ENCOUNTER — Other Ambulatory Visit (HOSPITAL_COMMUNITY): Payer: Self-pay

## 2022-04-14 ENCOUNTER — Other Ambulatory Visit: Payer: Self-pay | Admitting: Cardiovascular Disease

## 2022-04-15 DIAGNOSIS — M170 Bilateral primary osteoarthritis of knee: Secondary | ICD-10-CM | POA: Diagnosis not present

## 2022-04-15 DIAGNOSIS — M7051 Other bursitis of knee, right knee: Secondary | ICD-10-CM | POA: Diagnosis not present

## 2022-04-16 DIAGNOSIS — R600 Localized edema: Secondary | ICD-10-CM | POA: Diagnosis not present

## 2022-04-16 DIAGNOSIS — I83228 Varicose veins of left lower extremity with both ulcer of other part of lower extremity and inflammation: Secondary | ICD-10-CM | POA: Diagnosis not present

## 2022-04-16 DIAGNOSIS — L97929 Non-pressure chronic ulcer of unspecified part of left lower leg with unspecified severity: Secondary | ICD-10-CM | POA: Diagnosis not present

## 2022-04-16 DIAGNOSIS — L739 Follicular disorder, unspecified: Secondary | ICD-10-CM | POA: Diagnosis not present

## 2022-04-16 DIAGNOSIS — I83892 Varicose veins of left lower extremities with other complications: Secondary | ICD-10-CM | POA: Diagnosis not present

## 2022-04-16 DIAGNOSIS — A319 Mycobacterial infection, unspecified: Secondary | ICD-10-CM | POA: Diagnosis not present

## 2022-04-16 DIAGNOSIS — L97822 Non-pressure chronic ulcer of other part of left lower leg with fat layer exposed: Secondary | ICD-10-CM | POA: Diagnosis not present

## 2022-04-18 DIAGNOSIS — E785 Hyperlipidemia, unspecified: Secondary | ICD-10-CM | POA: Diagnosis not present

## 2022-04-18 DIAGNOSIS — I10 Essential (primary) hypertension: Secondary | ICD-10-CM | POA: Diagnosis not present

## 2022-04-18 DIAGNOSIS — E11621 Type 2 diabetes mellitus with foot ulcer: Secondary | ICD-10-CM | POA: Diagnosis not present

## 2022-04-18 DIAGNOSIS — I5032 Chronic diastolic (congestive) heart failure: Secondary | ICD-10-CM | POA: Diagnosis not present

## 2022-04-23 DIAGNOSIS — E1165 Type 2 diabetes mellitus with hyperglycemia: Secondary | ICD-10-CM | POA: Diagnosis not present

## 2022-04-23 DIAGNOSIS — I96 Gangrene, not elsewhere classified: Secondary | ICD-10-CM | POA: Diagnosis not present

## 2022-04-23 DIAGNOSIS — E1140 Type 2 diabetes mellitus with diabetic neuropathy, unspecified: Secondary | ICD-10-CM | POA: Diagnosis not present

## 2022-04-23 DIAGNOSIS — L97822 Non-pressure chronic ulcer of other part of left lower leg with fat layer exposed: Secondary | ICD-10-CM | POA: Diagnosis not present

## 2022-04-23 DIAGNOSIS — A319 Mycobacterial infection, unspecified: Secondary | ICD-10-CM | POA: Diagnosis not present

## 2022-04-23 DIAGNOSIS — I739 Peripheral vascular disease, unspecified: Secondary | ICD-10-CM | POA: Diagnosis not present

## 2022-04-23 DIAGNOSIS — Z87891 Personal history of nicotine dependence: Secondary | ICD-10-CM | POA: Diagnosis not present

## 2022-04-23 DIAGNOSIS — I83892 Varicose veins of left lower extremities with other complications: Secondary | ICD-10-CM | POA: Diagnosis not present

## 2022-04-23 DIAGNOSIS — L97929 Non-pressure chronic ulcer of unspecified part of left lower leg with unspecified severity: Secondary | ICD-10-CM | POA: Diagnosis not present

## 2022-04-23 DIAGNOSIS — E1152 Type 2 diabetes mellitus with diabetic peripheral angiopathy with gangrene: Secondary | ICD-10-CM | POA: Diagnosis not present

## 2022-04-23 DIAGNOSIS — E1151 Type 2 diabetes mellitus with diabetic peripheral angiopathy without gangrene: Secondary | ICD-10-CM | POA: Diagnosis not present

## 2022-04-23 DIAGNOSIS — E11622 Type 2 diabetes mellitus with other skin ulcer: Secondary | ICD-10-CM | POA: Diagnosis not present

## 2022-04-23 DIAGNOSIS — R600 Localized edema: Secondary | ICD-10-CM | POA: Diagnosis not present

## 2022-04-23 DIAGNOSIS — I83029 Varicose veins of left lower extremity with ulcer of unspecified site: Secondary | ICD-10-CM | POA: Diagnosis not present

## 2022-04-25 DIAGNOSIS — I2510 Atherosclerotic heart disease of native coronary artery without angina pectoris: Secondary | ICD-10-CM | POA: Diagnosis not present

## 2022-04-25 DIAGNOSIS — Z794 Long term (current) use of insulin: Secondary | ICD-10-CM | POA: Diagnosis not present

## 2022-04-25 DIAGNOSIS — E785 Hyperlipidemia, unspecified: Secondary | ICD-10-CM | POA: Diagnosis not present

## 2022-04-25 DIAGNOSIS — I482 Chronic atrial fibrillation, unspecified: Secondary | ICD-10-CM | POA: Diagnosis not present

## 2022-04-25 DIAGNOSIS — I10 Essential (primary) hypertension: Secondary | ICD-10-CM | POA: Diagnosis not present

## 2022-04-25 DIAGNOSIS — E11622 Type 2 diabetes mellitus with other skin ulcer: Secondary | ICD-10-CM | POA: Diagnosis not present

## 2022-04-25 DIAGNOSIS — E039 Hypothyroidism, unspecified: Secondary | ICD-10-CM | POA: Diagnosis not present

## 2022-04-25 DIAGNOSIS — I5032 Chronic diastolic (congestive) heart failure: Secondary | ICD-10-CM | POA: Diagnosis not present

## 2022-05-01 DIAGNOSIS — I83028 Varicose veins of left lower extremity with ulcer other part of lower leg: Secondary | ICD-10-CM | POA: Diagnosis not present

## 2022-05-01 DIAGNOSIS — E1140 Type 2 diabetes mellitus with diabetic neuropathy, unspecified: Secondary | ICD-10-CM | POA: Diagnosis not present

## 2022-05-01 DIAGNOSIS — I739 Peripheral vascular disease, unspecified: Secondary | ICD-10-CM | POA: Diagnosis not present

## 2022-05-01 DIAGNOSIS — I96 Gangrene, not elsewhere classified: Secondary | ICD-10-CM | POA: Diagnosis not present

## 2022-05-01 DIAGNOSIS — E11622 Type 2 diabetes mellitus with other skin ulcer: Secondary | ICD-10-CM | POA: Diagnosis not present

## 2022-05-01 DIAGNOSIS — R600 Localized edema: Secondary | ICD-10-CM | POA: Diagnosis not present

## 2022-05-01 DIAGNOSIS — E1151 Type 2 diabetes mellitus with diabetic peripheral angiopathy without gangrene: Secondary | ICD-10-CM | POA: Diagnosis not present

## 2022-05-01 DIAGNOSIS — I83892 Varicose veins of left lower extremities with other complications: Secondary | ICD-10-CM | POA: Diagnosis not present

## 2022-05-01 DIAGNOSIS — Z87891 Personal history of nicotine dependence: Secondary | ICD-10-CM | POA: Diagnosis not present

## 2022-05-01 DIAGNOSIS — L97822 Non-pressure chronic ulcer of other part of left lower leg with fat layer exposed: Secondary | ICD-10-CM | POA: Diagnosis not present

## 2022-05-01 DIAGNOSIS — I83029 Varicose veins of left lower extremity with ulcer of unspecified site: Secondary | ICD-10-CM | POA: Diagnosis not present

## 2022-05-01 DIAGNOSIS — E1152 Type 2 diabetes mellitus with diabetic peripheral angiopathy with gangrene: Secondary | ICD-10-CM | POA: Diagnosis not present

## 2022-05-01 DIAGNOSIS — A319 Mycobacterial infection, unspecified: Secondary | ICD-10-CM | POA: Diagnosis not present

## 2022-05-01 DIAGNOSIS — L97929 Non-pressure chronic ulcer of unspecified part of left lower leg with unspecified severity: Secondary | ICD-10-CM | POA: Diagnosis not present

## 2022-05-03 ENCOUNTER — Other Ambulatory Visit (HOSPITAL_COMMUNITY): Payer: Self-pay

## 2022-05-05 DIAGNOSIS — E1165 Type 2 diabetes mellitus with hyperglycemia: Secondary | ICD-10-CM | POA: Diagnosis not present

## 2022-05-05 DIAGNOSIS — E1140 Type 2 diabetes mellitus with diabetic neuropathy, unspecified: Secondary | ICD-10-CM | POA: Diagnosis not present

## 2022-05-06 DIAGNOSIS — M1711 Unilateral primary osteoarthritis, right knee: Secondary | ICD-10-CM | POA: Diagnosis not present

## 2022-05-07 DIAGNOSIS — E1152 Type 2 diabetes mellitus with diabetic peripheral angiopathy with gangrene: Secondary | ICD-10-CM | POA: Diagnosis not present

## 2022-05-07 DIAGNOSIS — I83892 Varicose veins of left lower extremities with other complications: Secondary | ICD-10-CM | POA: Diagnosis not present

## 2022-05-07 DIAGNOSIS — Z87891 Personal history of nicotine dependence: Secondary | ICD-10-CM | POA: Diagnosis not present

## 2022-05-07 DIAGNOSIS — I83029 Varicose veins of left lower extremity with ulcer of unspecified site: Secondary | ICD-10-CM | POA: Diagnosis not present

## 2022-05-07 DIAGNOSIS — L97922 Non-pressure chronic ulcer of unspecified part of left lower leg with fat layer exposed: Secondary | ICD-10-CM | POA: Diagnosis not present

## 2022-05-07 DIAGNOSIS — R600 Localized edema: Secondary | ICD-10-CM | POA: Diagnosis not present

## 2022-05-07 DIAGNOSIS — E1151 Type 2 diabetes mellitus with diabetic peripheral angiopathy without gangrene: Secondary | ICD-10-CM | POA: Diagnosis not present

## 2022-05-07 DIAGNOSIS — L97929 Non-pressure chronic ulcer of unspecified part of left lower leg with unspecified severity: Secondary | ICD-10-CM | POA: Diagnosis not present

## 2022-05-07 DIAGNOSIS — E11622 Type 2 diabetes mellitus with other skin ulcer: Secondary | ICD-10-CM | POA: Diagnosis not present

## 2022-05-07 DIAGNOSIS — I96 Gangrene, not elsewhere classified: Secondary | ICD-10-CM | POA: Diagnosis not present

## 2022-05-07 DIAGNOSIS — E1140 Type 2 diabetes mellitus with diabetic neuropathy, unspecified: Secondary | ICD-10-CM | POA: Diagnosis not present

## 2022-05-07 DIAGNOSIS — L97822 Non-pressure chronic ulcer of other part of left lower leg with fat layer exposed: Secondary | ICD-10-CM | POA: Diagnosis not present

## 2022-05-08 ENCOUNTER — Other Ambulatory Visit (HOSPITAL_COMMUNITY): Payer: Self-pay

## 2022-05-08 DIAGNOSIS — M25561 Pain in right knee: Secondary | ICD-10-CM | POA: Diagnosis not present

## 2022-05-08 DIAGNOSIS — M25661 Stiffness of right knee, not elsewhere classified: Secondary | ICD-10-CM | POA: Diagnosis not present

## 2022-05-09 ENCOUNTER — Ambulatory Visit (INDEPENDENT_AMBULATORY_CARE_PROVIDER_SITE_OTHER): Payer: Medicare Other | Admitting: Podiatry

## 2022-05-09 DIAGNOSIS — E11622 Type 2 diabetes mellitus with other skin ulcer: Secondary | ICD-10-CM | POA: Diagnosis not present

## 2022-05-09 DIAGNOSIS — L84 Corns and callosities: Secondary | ICD-10-CM | POA: Diagnosis not present

## 2022-05-09 DIAGNOSIS — E1149 Type 2 diabetes mellitus with other diabetic neurological complication: Secondary | ICD-10-CM

## 2022-05-09 DIAGNOSIS — M79675 Pain in left toe(s): Secondary | ICD-10-CM | POA: Diagnosis not present

## 2022-05-09 DIAGNOSIS — I739 Peripheral vascular disease, unspecified: Secondary | ICD-10-CM | POA: Diagnosis not present

## 2022-05-09 DIAGNOSIS — B351 Tinea unguium: Secondary | ICD-10-CM

## 2022-05-09 DIAGNOSIS — M79674 Pain in right toe(s): Secondary | ICD-10-CM

## 2022-05-09 DIAGNOSIS — I5032 Chronic diastolic (congestive) heart failure: Secondary | ICD-10-CM | POA: Diagnosis not present

## 2022-05-09 DIAGNOSIS — E039 Hypothyroidism, unspecified: Secondary | ICD-10-CM | POA: Diagnosis not present

## 2022-05-09 DIAGNOSIS — I482 Chronic atrial fibrillation, unspecified: Secondary | ICD-10-CM | POA: Diagnosis not present

## 2022-05-09 DIAGNOSIS — E785 Hyperlipidemia, unspecified: Secondary | ICD-10-CM | POA: Diagnosis not present

## 2022-05-09 DIAGNOSIS — Z794 Long term (current) use of insulin: Secondary | ICD-10-CM | POA: Diagnosis not present

## 2022-05-09 DIAGNOSIS — I2510 Atherosclerotic heart disease of native coronary artery without angina pectoris: Secondary | ICD-10-CM | POA: Diagnosis not present

## 2022-05-09 DIAGNOSIS — I10 Essential (primary) hypertension: Secondary | ICD-10-CM | POA: Diagnosis not present

## 2022-05-11 ENCOUNTER — Other Ambulatory Visit (HOSPITAL_COMMUNITY): Payer: Self-pay

## 2022-05-12 NOTE — Progress Notes (Signed)
  Subjective:  Patient ID: Harold [name], male    DOB: [birth year],  MRN: [id number]  Chief Complaint  Patient presents with   Nail Problem    Thick painful toenails, 3 month follow up    67 y.o. male returns with the above complaint. History confirmed with patient.  Nails and calluses are thickened again.  He has an upcoming cardiac ablation next month, previous debridement of both issues have been helpful.  Objective:  Physical Exam: no trophic changes or ulcerative lesions, reduced sensation at plantar pulps of toes and feet, large spider veins, varicosities and torturous veins in the lower legs along with venous stasis dermatitis noted and +1 PT and DP pulses bilaterally, both feet are warm and well-perfused.  Dry skin and fissuring plantar heel bilateral.  No ulcerations Left Foot: Moderate to severe hallux valgus with semirigid hammertoes present, mycotic nails x5,   Callus on medial hallux Right Foot: Mild to moderate hallux valgus with semirigid hammertoes present, mycotic nails since 5.  There is a callus on the medial hallux. right dorsal fifth PIPJ ulcer has healed with overlying hyperkeratosis.       Assessment:   1. PAD (peripheral artery disease) ([place])   2. Corns and callus   3. Pain due to onychomycosis of toenails of both feet        Plan:  Patient was evaluated and treated and all questions answered.      All symptomatic hyperkeratoses were safely debrided with a sterile #15 blade to patient's level of comfort without incident. We discussed preventative and palliative care of these lesions including supportive and accommodative shoegear, padding, prefabricated and custom molded accommodative orthoses, use of a pumice stone and lotions/creams daily.  Discussed the etiology and treatment options for the condition in detail with the patient.  Routine regular debridement has been helpful. Recommended debridement of the nails today. Sharp and mechanical  debridement performed of all painful and mycotic nails today. Nails debrided in length and thickness using a nail nipper to level of comfort. Discussed treatment options including appropriate shoe gear. Follow up as needed for painful nails.     Return in about 9 weeks (around 05/09/2022) for at risk diabetic foot care.

## 2022-05-14 DIAGNOSIS — E1140 Type 2 diabetes mellitus with diabetic neuropathy, unspecified: Secondary | ICD-10-CM | POA: Diagnosis not present

## 2022-05-14 DIAGNOSIS — L97922 Non-pressure chronic ulcer of unspecified part of left lower leg with fat layer exposed: Secondary | ICD-10-CM | POA: Diagnosis not present

## 2022-05-14 DIAGNOSIS — I83892 Varicose veins of left lower extremities with other complications: Secondary | ICD-10-CM | POA: Diagnosis not present

## 2022-05-14 DIAGNOSIS — I83029 Varicose veins of left lower extremity with ulcer of unspecified site: Secondary | ICD-10-CM | POA: Diagnosis not present

## 2022-05-14 DIAGNOSIS — L97929 Non-pressure chronic ulcer of unspecified part of left lower leg with unspecified severity: Secondary | ICD-10-CM | POA: Diagnosis not present

## 2022-05-14 DIAGNOSIS — Z87891 Personal history of nicotine dependence: Secondary | ICD-10-CM | POA: Diagnosis not present

## 2022-05-14 DIAGNOSIS — E1152 Type 2 diabetes mellitus with diabetic peripheral angiopathy with gangrene: Secondary | ICD-10-CM | POA: Diagnosis not present

## 2022-05-14 DIAGNOSIS — I96 Gangrene, not elsewhere classified: Secondary | ICD-10-CM | POA: Diagnosis not present

## 2022-05-14 DIAGNOSIS — R600 Localized edema: Secondary | ICD-10-CM | POA: Diagnosis not present

## 2022-05-14 DIAGNOSIS — E11622 Type 2 diabetes mellitus with other skin ulcer: Secondary | ICD-10-CM | POA: Diagnosis not present

## 2022-05-14 DIAGNOSIS — E1165 Type 2 diabetes mellitus with hyperglycemia: Secondary | ICD-10-CM | POA: Diagnosis not present

## 2022-05-14 DIAGNOSIS — E1151 Type 2 diabetes mellitus with diabetic peripheral angiopathy without gangrene: Secondary | ICD-10-CM | POA: Diagnosis not present

## 2022-05-14 DIAGNOSIS — L97822 Non-pressure chronic ulcer of other part of left lower leg with fat layer exposed: Secondary | ICD-10-CM | POA: Diagnosis not present

## 2022-05-15 DIAGNOSIS — I4891 Unspecified atrial fibrillation: Secondary | ICD-10-CM | POA: Diagnosis not present

## 2022-05-15 DIAGNOSIS — Z86010 Personal history of colonic polyps: Secondary | ICD-10-CM | POA: Diagnosis not present

## 2022-05-16 ENCOUNTER — Ambulatory Visit: Payer: Medicare Other | Attending: Cardiovascular Disease | Admitting: Cardiovascular Disease

## 2022-05-16 ENCOUNTER — Encounter: Payer: Self-pay | Admitting: Cardiovascular Disease

## 2022-05-16 ENCOUNTER — Other Ambulatory Visit (HOSPITAL_COMMUNITY): Payer: Self-pay

## 2022-05-16 ENCOUNTER — Other Ambulatory Visit: Payer: Self-pay

## 2022-05-16 VITALS — BP 160/96 | HR 101 | Ht 73.0 in | Wt 319.0 lb

## 2022-05-16 DIAGNOSIS — E119 Type 2 diabetes mellitus without complications: Secondary | ICD-10-CM | POA: Diagnosis not present

## 2022-05-16 DIAGNOSIS — I4819 Other persistent atrial fibrillation: Secondary | ICD-10-CM | POA: Diagnosis not present

## 2022-05-16 DIAGNOSIS — Z961 Presence of intraocular lens: Secondary | ICD-10-CM | POA: Diagnosis not present

## 2022-05-16 MED ORDER — MOUNJARO 7.5 MG/0.5ML ~~LOC~~ SOAJ
7.5000 mg | SUBCUTANEOUS | 2 refills | Status: DC
  Filled 2022-05-16 (×2): qty 2, 28d supply, fill #0

## 2022-05-16 NOTE — Patient Instructions (Signed)
Medication Instructions:  Your physician recommends that you continue on your current medications as directed. Please refer to the Current Medication list given to you today.  *If you need a refill on your cardiac medications before your next appointment, please call your pharmacy* Follow-Up: At [facility], you and your health needs are our priority.  As part of our continuing mission to provide you with exceptional heart care, we have created designated Provider Care Teams.  These Care Teams include your primary Cardiologist (physician) and Advanced Practice Providers (APPs -  Physician Assistants and Nurse Practitioners) who all work together to provide you with the care you need, when you need it.  Your next appointment:   2-3 week(s)  Provider:   You will follow up in the [facility] located at [facility]. Your provider will be: Roderic Palau, NP or Clint R. Fenton, PA-C

## 2022-05-16 NOTE — Progress Notes (Signed)
Electrophysiology Office Note:    Date:  05/16/2022   ID:  Harold [name], DOB Oct 31, 1955, MRN [id number]  PCP:  Deland Pretty, MD   [facility] Cardiologist:  Glenetta Hew, MD Electrophysiologist:  Melida Quitter, MD     Referring MD: Oliver Barre, PA   History of Present Illness:    Harold [name] is a 67 y.o. male with a hx listed below, significant for CAD status post PCI, diabetes, hypertension, CKD 2, sleep apnea, referred for arrhythmia management.  He was originally diagnosed with atrial fibrillation by Dr. Ellyn Hack on January 28, 2022.  He had noticed shortness of breath and fatigue since November but no palpitations.  He underwent DC cardioversion on March 13, 2022. He felt great for about a week after the ablations but again began to feel winded and fatigued.  Past Medical History:  Diagnosis Date   Anginal pain [facility]) March 2015   Cardiac cath showed patent stents with distal LAD, circumflex-OM and RCA disease in small vessels.   Anxiety    CAD S/P percutaneous coronary angioplasty 2003, 04/2012   status post PCI to LAD, circumflex-OM 2, RCA   Carotid artery occlusion    Chronic renal insufficiency, stage II (mild)    Chronic venous insufficiency    varicosities, no reflux; dopplers 04/14/12- valvular insufficiency in the R and L GSV   Complication of anesthesia    COPD, mild (HCC)    Depression    situaltional    Diabetes mellitus    Diabetic neuropathy ([place]) 08/24/2019   Dyslipidemia associated with type 2 diabetes mellitus (HCC)    GERD (gastroesophageal reflux disease)    HTN (hypertension)    Hypothyroidism    Neuromuscular disorder (HCC)    neuropathy in feet   Neuropathy    notably improved following PCI with improved cardiac function   Obesity    Obesity, Class II, BMI 35.0-39.9, with comorbidity (see actual BMI)    BMI 39; wgt loss efforts in place; seeing Dietician   PONV (postoperative nausea and vomiting)     "[employer] [place]"   Pulmonary hypertension ([place]) 04/2012   PA pressure 66mmhg   Sleep apnea    uses nightly    Past Surgical History:  Procedure Laterality Date   ABDOMINAL AORTAGRAM N/A 11/15/2011   Procedure: ABDOMINAL Maxcine Ham;  Surgeon: Elam Dutch, MD;  Location: [facility] CATH LAB;  Service: Cardiovascular;  [name];   ABDOMINAL AORTOGRAM W/LOWER EXTREMITY N/A 10/13/2019   Procedure: ABDOMINAL AORTOGRAM W/LOWER EXTREMITY;  Surgeon: [place] Hampshire, MD;  Location: [facility] CV LAB;  Service: CV: Non-obst Ao-Iliac. R SFA-PopA patent with significant calcific stenosis of TP trunk-prox PTA (dominant) & CTO ATA  -> R PTA-TP PTCA   ABIs  04/27/2012   mild bilateral arterial insufficiency   BACK SURGERY  2005 x1   X2-2010   BUNIONECTOMY Right 11/11/2013   Procedure: RIGHT FOOT SILVER BUNIONECTOMY;  Surgeon: Wylene Simmer, MD;  Location: [place];  Service: Orthopedics;  Laterality: Right;   CARDIAC CATHETERIZATION  12/11/2001   significant 3V CAD, normal LV function   CARDIOVERSION N/A 03/13/2022   Procedure: CARDIOVERSION;  Surgeon: Donato Heinz, MD;  Location: [facility] ENDOSCOPY;  Service: Cardiovascular;  [name];   Carotid Doppler  04/27/2012   right internal carotid: Elevated velocities but no evidence of plaque. Left internal carotid 40-59%   CAROTID ENDARTERECTOMY  2005   Right; recent carotid Dopplers notes elevated velocities.  colonscopy     CORONARY ANGIOPLASTY  12/21/2001   PTCA of the distal and mid AV groove circ, unsuccessful PTCA of second OM total occlusion, unsuccessful PTCA of the apical LAD total occlusion   CORONARY ANGIOPLASTY WITH STENT PLACEMENT  04/29/2012   PCI to 3 RCA lesions, Promus Premiere 2.54mmx8mm distally, mid was 2.48mx28mm and proximally 2.75x81mm, EF 55-60%   CORONARY STENT PLACEMENT  04/28/2012   PCI to LAD (3x72mm Xience DES postdilated to 3.25) and circ prox and mid (2 overlappinmg 2.90mmx12mmXience DES  postdilated to 2.53mm)   DOPPLER ECHOCARDIOGRAPHY  04/28/2012   poor quality study: EF estimated 60-65%; unable to assess diastolic function (previously noted to have diastolic dysfunction); severely dilated left atrium and mild right atrium; dilated IVC consistent with increased central venous pressure.Marland Kitchen   HERNIA REPAIR     LEFT AND RIGHT HEART CATHETERIZATION WITH CORONARY ANGIOGRAM N/A 04/27/2012   Procedure: LEFT AND RIGHT HEART CATHETERIZATION WITH CORONARY ANGIOGRAM;  Surgeon: Leonie Man, MD;  Location: [facility] CATH LAB;  Service: Cardiovascular;  [name];   LEFT AND RIGHT HEART CATHETERIZATION WITH CORONARY ANGIOGRAM N/A 05/14/2013   Procedure: LEFT AND RIGHT HEART CATHETERIZATION WITH CORONARY ANGIOGRAM;  Surgeon: Troy Sine, MD;  Location: [place] CATH LAB: Moderate Pulm HTN: 46/16 - mean 33 mmHg; PCWP 65mmHg;; [name], [name], Patent RCA ostial mid and distal stents - 70% distal runoff Dz   LUMBAR LAMINECTOMY/DECOMPRESSION MICRODISCECTOMY  01/07/2012   Procedure: LUMBAR LAMINECTOMY/DECOMPRESSION MICRODISCECTOMY 1 LEVEL;  Surgeon: Winfield Cunas, MD;  [name];  Service: Neurosurgery;  Laterality: Bilateral;   Lumbar Three-Four Decompression   LUMBAR LAMINECTOMY/DECOMPRESSION MICRODISCECTOMY N/A 07/26/2020   Procedure: Lumbar two-three Laminectomy/Foraminotomy;  Surgeon: Ashok Pall, MD;  Location: [place];  Service: Neurosurgery;  [name];   PERCUTANEOUS CORONARY STENT INTERVENTION (PCI-S) N/A 04/28/2012   Procedure: PERCUTANEOUS CORONARY STENT INTERVENTION (PCI-S);  Surgeon: Troy Sine, MD;  Location: [facility] CATH LAB;  Service: Cardiovascular;  [name];   PERCUTANEOUS CORONARY STENT INTERVENTION (PCI-S) N/A 04/29/2012   Procedure: PERCUTANEOUS CORONARY STENT INTERVENTION (PCI-S);  Surgeon: Leonie Man, MD;  Location: [facility] CATH LAB;  Service: Cardiovascular;   [name];   PERIPHERAL VASCULAR BALLOON ANGIOPLASTY Right 10/13/2019   Procedure: PERIPHERAL VASCULAR BALLOON ANGIOPLASTY;  Surgeon: [place] Hampshire, MD;  Location: [place] CV LAB;  Service: Cardiovascular;  Laterality: Right;  PTCA of R TP Trunk into PTA (Drug-coated) => Follow-up ABIs 0.9 on the right and 0.99 on the left.   SPINE SURGERY     UMBILICAL HERNIA REPAIR  2009   steel mesh insert    Current Medications: Current Meds  Medication Sig   allopurinol (ZYLOPRIM) 100 MG tablet Take 100-150 mg by mouth See admin instructions. Take 100 mg in the morning and 150 mg at night   apixaban (ELIQUIS) 5 MG TABS tablet Take 1 tablet (5 mg total) by mouth 2 (two) times daily.   ARIPiprazole (ABILIFY) 5 MG tablet Take 5 mg by mouth daily.   B-D ULTRAFINE III SHORT PEN 31G X 8 MM MISC Inject 1 each into the skin 3 (three) times daily.   carvedilol (COREG) 12.5 MG tablet TAKE 1 TABLET (12.5MG  TOTAL) BY MOUTH TWICE A DAY WITH MEALS   COLCRYS 0.6 MG tablet Take 0.6 mg by mouth daily.   Continuous Blood Gluc Sensor (FREESTYLE LIBRE 14 DAY SENSOR) MISC APPLY 1 SENSOR  EVERY 14 DAYS   DULoxetine (CYMBALTA) 60 MG capsule Take 60 mg by mouth daily.   ezetimibe (ZETIA) 10 MG tablet Take 10 mg by mouth daily.   FARXIGA 10 MG TABS tablet Take 10 mg by mouth daily.   furosemide (LASIX) 80 MG tablet Take 160 mg by mouth 2 (two) times daily.   gabapentin (NEURONTIN) 800 MG tablet Take 1,600 mg by mouth 3 (three) times daily.   insulin regular human CONCENTRATED (HUMULIN R U-500 KWIKPEN) 500 UNIT/ML kwikpen Inject 100-200 Units into the skin See admin instructions. Inject 100 units in the morning and 200 units at night   levothyroxine (SYNTHROID, LEVOTHROID) 88 MCG tablet Take 88 mcg by mouth daily before breakfast.   losartan (COZAAR) 25 MG tablet Take 12.5 mg by mouth daily.   meclizine (ANTIVERT) 25 MG tablet Take 25 mg by mouth 3 (three) times daily.   mupirocin ointment (BACTROBAN) 2 % APPLY TO  AFFECTED AREA TWICE A DAY (Patient taking differently: Apply 1 Application topically 2 (two) times daily as needed (wound care).)   nitroGLYCERIN (NITROSTAT) 0.4 MG SL tablet PLACE 1 TAB UNDER THE TONGUE EVERY 5 MINUTES AS NEEDED FOR CHEST PAIN (SEVERE PRESSURE OR TIGHTNESS)   omeprazole (PRILOSEC) 20 MG capsule TAKE 1 CAPSULE BY MOUTH EVERY DAY   potassium chloride (MICRO-K) 10 MEQ CR capsule Take 10 mEq by mouth daily.    rosuvastatin (CRESTOR) 20 MG tablet Take 20 mg by mouth daily.   tirzepatide (MOUNJARO) 15 MG/0.5ML Pen Inject 15 mg into the skin once a week.   TRESIBA FLEXTOUCH 200 UNIT/ML FlexTouch Pen Inject 84 Units into the skin daily.     Allergies:   Septra [bactrim], Penicillins, Metformin and related, Other, and Wellbutrin [bupropion]   Social and Family History: Reviewed in Epic  ROS:   Please see the history of present illness.    All other systems reviewed and are negative.  EKGs/Labs/Other Studies Reviewed Today:    Echocardiogram:  TTE 02/21/2021  1. EF challenging to assess with afib/ challenging windows. Left  ventricular ejection fraction, by estimation, is 50 to 55%. The left  ventricle has low normal function. The left ventricle demonstrates global  hypokinesis. There is mild concentric left ventricular hypertrophy. Left ventricular diastolic parameters are indeterminate.   2. Right ventricular systolic function is normal. The right ventricular  size is normal.   3. Left atrial size was mildly dilated.   4. Trivial mitral valve regurgitation.   5. There is mild calcification of the aortic valve. Aortic valve  regurgitation is not visualized.   6. There is mild dilatation of the ascending aorta, measuring 36 mm.   7. The inferior vena cava is normal in size with greater than 50%  respiratory variability, suggesting right atrial pressure of 3 mmHg.    Monitors:  13 day patch 02/2022  100% AF HR 61-197, avg 96  Stress testing:  01/2022 Exercise  duration - 1 minute Fixed inferior perfusion defect; no ischemia    Advanced imaging:   EKG:  Last EKG results: today - atrial fibrillation 101 bpm   Recent Labs: 03/12/2022: Hemoglobin 16.2; Platelets 211 03/27/2022: BUN 23; Creatinine, Ser 1.99; Potassium 4.3; Sodium 141     Physical Exam:    VS:  BP (!) 160/96   Pulse (!) 101   Ht 6\' 1"  (1.854 m)   Wt (!) 319 lb (144.7 kg)   SpO2 99%   BMI 42.09 kg/m     Wt Readings from Last  3 Encounters:  05/16/22 (!) 319 lb (144.7 kg)  03/27/22 (!) 325 lb (147.4 kg)  03/13/22 (!) 327 lb 2.6 oz (148.4 kg)     GEN: Well nourished, well developed in no acute distress CARDIAC: RRR, no murmurs, rubs, gallops RESPIRATORY:  Normal work of breathing MUSCULOSKELETAL: no edema    ASSESSMENT & PLAN:    Persistent atrial fibrillation Symptomatic with fatigue He is progressing with weight loss on [place]. I would prefer to see him lose more weight prior to ablation both to improve the likelihood of success and to reduce procedural risk I have asked him to pursue stopping abilify. I would treat him medically for now and reconsider ablation if he continues to lose weight.  Obesity BMI 42.09 He is progressing with weight loss  Secondary hypercoagulable state CHADS2Vasc 4 Continue epixaban  OSA I encouraged compliance with CPAP  CAD Inferior MI S/p PCI Precludes class II AAD          Medication Adjustments/Labs and Tests Ordered: Current medicines are reviewed at length with the patient today.  Concerns regarding medicines are outlined above.  Orders Placed This Encounter  Procedures   EKG 12-Lead   No orders of the defined types were placed in this encounter.    Signed, Melida Quitter, MD  05/16/2022 12:49 PM    [place]

## 2022-05-28 DIAGNOSIS — I83028 Varicose veins of left lower extremity with ulcer other part of lower leg: Secondary | ICD-10-CM | POA: Diagnosis not present

## 2022-05-28 DIAGNOSIS — Z7984 Long term (current) use of oral hypoglycemic drugs: Secondary | ICD-10-CM | POA: Diagnosis not present

## 2022-05-28 DIAGNOSIS — L97922 Non-pressure chronic ulcer of unspecified part of left lower leg with fat layer exposed: Secondary | ICD-10-CM | POA: Diagnosis not present

## 2022-05-28 DIAGNOSIS — I872 Venous insufficiency (chronic) (peripheral): Secondary | ICD-10-CM | POA: Diagnosis not present

## 2022-05-28 DIAGNOSIS — E1151 Type 2 diabetes mellitus with diabetic peripheral angiopathy without gangrene: Secondary | ICD-10-CM | POA: Diagnosis not present

## 2022-05-28 DIAGNOSIS — Z794 Long term (current) use of insulin: Secondary | ICD-10-CM | POA: Diagnosis not present

## 2022-05-28 DIAGNOSIS — L97822 Non-pressure chronic ulcer of other part of left lower leg with fat layer exposed: Secondary | ICD-10-CM | POA: Diagnosis not present

## 2022-05-28 DIAGNOSIS — E1165 Type 2 diabetes mellitus with hyperglycemia: Secondary | ICD-10-CM | POA: Diagnosis not present

## 2022-05-28 DIAGNOSIS — E11622 Type 2 diabetes mellitus with other skin ulcer: Secondary | ICD-10-CM | POA: Diagnosis not present

## 2022-05-28 DIAGNOSIS — I83029 Varicose veins of left lower extremity with ulcer of unspecified site: Secondary | ICD-10-CM | POA: Diagnosis not present

## 2022-06-03 ENCOUNTER — Telehealth: Payer: Self-pay | Admitting: Cardiology

## 2022-06-03 NOTE — Telephone Encounter (Signed)
Pt c/o medication issue:  1. Name of Medication:   apixaban (ELIQUIS) 5 MG TABS tablet    2. How are you currently taking this medication (dosage and times per day)?   Take 1 tablet (5 mg total) by mouth 2 (two) times daily.    3. Are you having a reaction (difficulty breathing--STAT)? No  4. What is your medication issue? Pt would like a callback regarding other options as far as generic brand for this above medication or something cheaper due to medication being expensive for pt. Please advise.

## 2022-06-03 NOTE — Telephone Encounter (Signed)
Left voicemail for patient to return call to office. 

## 2022-06-04 ENCOUNTER — Encounter (HOSPITAL_COMMUNITY): Payer: Self-pay | Admitting: Physician Assistant

## 2022-06-04 ENCOUNTER — Ambulatory Visit (HOSPITAL_COMMUNITY)
Admission: RE | Admit: 2022-06-04 | Discharge: 2022-06-04 | Disposition: A | Discharge status: Home / Self Care | Payer: Medicare Other | Source: Ambulatory Visit | Attending: Physician Assistant | Admitting: Physician Assistant

## 2022-06-04 VITALS — BP 106/80 | HR 106 | Ht 73.0 in | Wt 324.8 lb

## 2022-06-04 DIAGNOSIS — Z87891 Personal history of nicotine dependence: Secondary | ICD-10-CM | POA: Diagnosis not present

## 2022-06-04 DIAGNOSIS — I10 Essential (primary) hypertension: Secondary | ICD-10-CM | POA: Insufficient documentation

## 2022-06-04 DIAGNOSIS — I739 Peripheral vascular disease, unspecified: Secondary | ICD-10-CM | POA: Insufficient documentation

## 2022-06-04 DIAGNOSIS — D6869 Other thrombophilia: Secondary | ICD-10-CM | POA: Insufficient documentation

## 2022-06-04 DIAGNOSIS — Z79899 Other long term (current) drug therapy: Secondary | ICD-10-CM | POA: Insufficient documentation

## 2022-06-04 DIAGNOSIS — E669 Obesity, unspecified: Secondary | ICD-10-CM | POA: Diagnosis not present

## 2022-06-04 DIAGNOSIS — I2510 Atherosclerotic heart disease of native coronary artery without angina pectoris: Secondary | ICD-10-CM | POA: Diagnosis not present

## 2022-06-04 DIAGNOSIS — Z7901 Long term (current) use of anticoagulants: Secondary | ICD-10-CM | POA: Insufficient documentation

## 2022-06-04 DIAGNOSIS — Z6841 Body Mass Index (BMI) 40.0 and over, adult: Secondary | ICD-10-CM | POA: Insufficient documentation

## 2022-06-04 DIAGNOSIS — I4819 Other persistent atrial fibrillation: Secondary | ICD-10-CM | POA: Insufficient documentation

## 2022-06-04 DIAGNOSIS — G4733 Obstructive sleep apnea (adult) (pediatric): Secondary | ICD-10-CM | POA: Insufficient documentation

## 2022-06-04 NOTE — Telephone Encounter (Signed)
LVM to please call our office

## 2022-06-04 NOTE — Progress Notes (Signed)
Primary Care Physician: [name], MD Primary Cardiologist: Dr Herbie Baltimore  Primary Electrophysiologist: Dr Nelly Laurence Referring Physician: Dr Ewell Poe is a 67 y.o. male with a history of carotid disease, obstructive sleep apnea on CPAP, peripheral arterial disease, essential hypertension, obesity, chronic kidney disease, type 2 diabetes, CAD, hyperlipidemia, atrial fibrillation who presents for follow up in the [facility].  The patient was initially diagnosed with atrial fibrillation 01/28/22 at his visit with Dr Herbie Baltimore. Patient had noticed more fatigue with exertion for the previous two months but was otherwise unaware of his arrhythmia. He was started on Eliquis for a CHADS2VASC score of 4. An echo and cardiac monitor were also ordered. He denies significant alcohol use and is compliant with his CPAP. The cardiac monitor showed 100% afib burden.  Patient is s/p DCCV on 03/13/22. Patient reports that after his DCCV he felt less SOB. He was back out of rhythm on follow up 03/27/22. Seen by Dr Nelly Laurence to discuss rhythm control options who recommended medical therapy. Patient remains in afib with symptoms of fatigue.   Today, he denies symptoms of palpitations, chest pain, shortness of breath, orthopnea, PND, lower extremity edema, dizziness, presyncope, syncope, bleeding, or neurologic sequela. The patient is tolerating medications without difficulties and is otherwise without complaint today.    Atrial Fibrillation Risk Factors:  he does have symptoms or diagnosis of sleep apnea. he is compliant with CPAP therapy. he does not have a history of rheumatic fever. he does not have a history of alcohol use.   he has a BMI of Body mass index is 42.85 kg/m.Marland Kitchen Filed Weights   06/04/22 1439  Weight: (!) 147.3 kg   Family History  Adopted: Yes  Family history unknown: Yes     Atrial Fibrillation Management history:  Previous antiarrhythmic  drugs: none Previous cardioversions: 03/13/22 Previous ablations: none CHADS2VASC score: 4 Anticoagulation history: Eliquis   Past Medical History:  Diagnosis Date   Anginal pain March 2015   Cardiac cath showed patent stents with distal LAD, circumflex-OM and RCA disease in small vessels.   Anxiety    CAD S/P percutaneous coronary angioplasty 2003, 04/2012   status post PCI to LAD, circumflex-OM 2, RCA   Carotid artery occlusion    Chronic renal insufficiency, stage II (mild)    Chronic venous insufficiency    varicosities, no reflux; dopplers 04/14/12- valvular insufficiency in the R and L GSV   Complication of anesthesia    COPD, mild    Depression    situaltional    Diabetes mellitus    Diabetic neuropathy 08/24/2019   Dyslipidemia associated with type 2 diabetes mellitus    GERD (gastroesophageal reflux disease)    HTN (hypertension)    Hypothyroidism    Neuromuscular disorder    neuropathy in feet   Neuropathy    notably improved following PCI with improved cardiac function   Obesity    Obesity, Class II, BMI 35.0-39.9, with comorbidity (see actual BMI)    BMI 39; wgt loss efforts in place; seeing Dietician   PONV (postoperative nausea and vomiting)    "[employer]"   Pulmonary hypertension 04/2012   PA pressure   Sleep apnea    uses nightly   Past Surgical History:  Procedure Laterality Date   ABDOMINAL AORTAGRAM N/A 11/15/2011   Procedure: ABDOMINAL Ronny Flurry;  Surgeon: Sherren Kerns, MD;  Location: [facility] CATH LAB;  Service: Cardiovascular;  [name];   ABDOMINAL  AORTOGRAM W/LOWER EXTREMITY N/A 10/13/2019   Procedure: ABDOMINAL AORTOGRAM W/LOWER EXTREMITY;  Surgeon: Iran Ouch, MD;  Location: [facility] INVASIVE CV LAB;  Service: CV: Non-obst Ao-Iliac. R SFA-PopA patent with significant calcific stenosis of TP trunk-prox PTA (dominant) & CTO ATA  -> R PTA-TP PTCA   ABIs  04/27/2012   mild bilateral arterial insufficiency   BACK SURGERY  2005 x1    X2-2010   BUNIONECTOMY Right 11/11/2013   Procedure: RIGHT FOOT SILVER BUNIONECTOMY;  Surgeon: Toni Arthurs, MD;  Location: [facility];  Service: Orthopedics;  Laterality: Right;   CARDIAC CATHETERIZATION  12/11/2001   significant 3V CAD, normal LV function   CARDIOVERSION N/A 03/13/2022   Procedure: CARDIOVERSION;  Surgeon: Little Ishikawa, MD;  Location: [facility] ENDOSCOPY;  Service: Cardiovascular;  [name];   Carotid Doppler  04/27/2012   right internal carotid: Elevated velocities but no evidence of plaque. Left internal carotid 40-59%   CAROTID ENDARTERECTOMY  2005   Right; recent carotid Dopplers notes elevated velocities.    colonscopy     CORONARY ANGIOPLASTY  12/21/2001   PTCA of the distal and mid AV groove circ, unsuccessful PTCA of second OM total occlusion, unsuccessful PTCA of the apical LAD total occlusion   CORONARY ANGIOPLASTY WITH STENT PLACEMENT  04/29/2012   PCI to 3 RCA lesions, Promus Premiere 2.24mmx8mm distally, mid was 2.67mx28mm and proximally 2.75x44mm, EF 55-60%   CORONARY STENT PLACEMENT  04/28/2012   PCI to LAD (3x30mm Xience DES postdilated to 3.25) and circ prox and mid (2 overlappinmg 2.79mmx12mmXience DES postdilated to 2.64mm)   DOPPLER ECHOCARDIOGRAPHY  04/28/2012   poor quality study: EF estimated 60-65%; unable to assess diastolic function (previously noted to have diastolic dysfunction); severely dilated left atrium and mild right atrium; dilated IVC consistent with increased central venous pressure.Marland Kitchen   HERNIA REPAIR     LEFT AND RIGHT HEART CATHETERIZATION WITH CORONARY ANGIOGRAM N/A 04/27/2012   Procedure: LEFT AND RIGHT HEART CATHETERIZATION WITH CORONARY ANGIOGRAM;  Surgeon: Marykay Lex, MD;  Location: [facility] CATH LAB;  Service: Cardiovascular;  [name];   LEFT AND RIGHT HEART CATHETERIZATION WITH CORONARY ANGIOGRAM N/A 05/14/2013   Procedure: LEFT AND RIGHT HEART CATHETERIZATION WITH CORONARY ANGIOGRAM;  Surgeon: Lennette Bihari, MD;  [name]; PCWP ;; [name], [name], Patent RCA ostial mid and distal stents - 70% distal runoff Dz   LUMBAR LAMINECTOMY/DECOMPRESSION MICRODISCECTOMY  01/07/2012   Procedure: LUMBAR LAMINECTOMY/DECOMPRESSION MICRODISCECTOMY 1 LEVEL;  Surgeon: Carmela Hurt, MD;  [name];  Service: Neurosurgery;  Laterality: Bilateral;   Lumbar Three-Four Decompression   LUMBAR LAMINECTOMY/DECOMPRESSION MICRODISCECTOMY N/A 07/26/2020   Procedure: Lumbar two-three Laminectomy/Foraminotomy;  Surgeon: Coletta Memos, MD;  [name];  Service: Neurosurgery;  [name];   PERCUTANEOUS CORONARY STENT INTERVENTION (PCI-S) N/A 04/28/2012   Procedure: PERCUTANEOUS CORONARY STENT INTERVENTION (PCI-S);  Surgeon: Lennette Bihari, MD;  Location: [facility] CATH LAB;  Service: Cardiovascular;  [name];   PERCUTANEOUS CORONARY STENT INTERVENTION (PCI-S) N/A 04/29/2012   Procedure: PERCUTANEOUS CORONARY STENT INTERVENTION (PCI-S);  Surgeon: Marykay Lex, MD;  Location: [facility] CATH LAB;  Service: Cardiovascular;  [name];   PERIPHERAL VASCULAR BALLOON ANGIOPLASTY Right 10/13/2019   Procedure: PERIPHERAL VASCULAR BALLOON ANGIOPLASTY;  Surgeon: Iran Ouch, MD;  Location: [facility] INVASIVE CV LAB;  Service: Cardiovascular;  Laterality: Right;  PTCA of R TP Trunk into PTA (Drug-coated) => Follow-up ABIs 0.9 on the right and 0.99 on the left.   SPINE SURGERY     UMBILICAL HERNIA REPAIR  2009   steel mesh insert    Current Outpatient Medications  Medication Sig Dispense Refill   allopurinol (ZYLOPRIM) 100 MG tablet Take 100-150 mg by mouth See admin instructions. Take 100 mg in the morning and 150 mg at night     apixaban (ELIQUIS) 5 MG TABS tablet Take 1 tablet (5 mg total) by mouth 2 (two) times daily. 180 tablet 2   ARIPiprazole  (ABILIFY) 5 MG tablet Take 5 mg by mouth daily.     B-D ULTRAFINE III SHORT PEN 31G X 8 MM MISC Inject 1 each into the skin 3 (three) times daily.     carvedilol (COREG) 12.5 MG tablet TAKE 1 TABLET (12.5MG  TOTAL) BY MOUTH TWICE A DAY WITH MEALS 180 tablet 3   COLCRYS 0.6 MG tablet Take 0.6 mg by mouth daily.     Continuous Blood Gluc Sensor (FREESTYLE LIBRE 14 DAY SENSOR) MISC APPLY 1 SENSOR EVERY 14 DAYS  11   DULoxetine (CYMBALTA) 60 MG capsule Take 60 mg by mouth daily.     ezetimibe (ZETIA) 10 MG tablet Take 10 mg by mouth daily.     FARXIGA 10 MG TABS tablet Take 10 mg by mouth daily.     furosemide (LASIX) 80 MG tablet Take 160 mg by mouth 2 (two) times daily.     gabapentin (NEURONTIN) 800 MG tablet Take 1,600 mg by mouth 3 (three) times daily.     insulin regular human CONCENTRATED (HUMULIN R U-500 KWIKPEN) 500 UNIT/ML kwikpen Inject 100-200 Units into the skin See admin instructions. Inject 100 units in the morning and 200 units at night     levothyroxine (SYNTHROID, LEVOTHROID) 88 MCG tablet Take 88 mcg by mouth daily before breakfast.     losartan (COZAAR) 25 MG tablet Take 12.5 mg by mouth daily.     meclizine (ANTIVERT) 25 MG tablet Take 25 mg by mouth 3 (three) times daily.     mupirocin ointment (BACTROBAN) 2 % APPLY TO AFFECTED AREA TWICE A DAY (Patient taking differently: Apply 1 Application topically 2 (two) times daily as needed (wound care).) 22 g 2   nitroGLYCERIN (NITROSTAT) 0.4 MG SL tablet PLACE 1 TAB UNDER THE TONGUE EVERY 5 MINUTES AS NEEDED FOR CHEST PAIN (SEVERE PRESSURE OR TIGHTNESS) 25 tablet 3   omeprazole (PRILOSEC) 20 MG capsule TAKE 1 CAPSULE BY MOUTH EVERY DAY 90 capsule 3   potassium chloride (MICRO-K) 10 MEQ CR capsule Take 10 mEq by mouth daily.   5   rosuvastatin (CRESTOR) 20 MG tablet Take 20 mg by mouth daily.     tirzepatide (MOUNJARO) 12.5 MG/0.5ML Pen Inject 12.5 mg into the skin once a week. 2 mL 2   tirzepatide (MOUNJARO) 12.5 MG/0.5ML Pen Inject  12.5 mg into the skin once a week. 2 mL 11   tirzepatide (MOUNJARO) 15 MG/0.5ML Pen Inject 15 mg into the skin once a week. 6 mL 3   tirzepatide (MOUNJARO) 7.5 MG/0.5ML Pen Inject 7.5 mg into the skin once a week. 2 mL 2   TRESIBA FLEXTOUCH 200 UNIT/ML FlexTouch Pen Inject 84 Units into the skin daily.     No current facility-administered medications for this encounter.    Allergies  Allergen Reactions   Septra [Bactrim] Itching   Penicillins Hives    Allergy  to All cillin drugs  Ancef given 9/24 with no obvious reaction   Metformin And Related Diarrhea and Nausea Only   Other Nausea And Vomiting    General Anesthesia - sometimes causes nausea and vomiting    Wellbutrin [Bupropion]     Mood Changes    Social History   Socioeconomic History   Marital status: Married    Spouse name: Not on file   Number of children: 0   Years of education: hs   Highest education level: Not on file  Occupational History   Occupation: Surveyor, quantity: replacement limited  Tobacco Use   Smoking status: Former    Packs/day: 1.00    Years: 42.00    Additional pack years: 0.00    Total pack years: 42.00    Types: Cigarettes    Quit date: 03/23/2003    Years since quitting: 19.2   Smokeless tobacco: Never   Tobacco comments:    Former smoker 02/19/22  Vaping Use   Vaping Use: Never used  Substance and Sexual Activity   Alcohol use: No    Alcohol/week: 0.0 standard drinks of alcohol   Drug use: No   Sexual activity: Not on file  Other Topics Concern   Not on file  Social History Narrative   He and his partner [name] have now officially gotten married on 05/26/2013.  [name] is also a patient of our clinic.  He is now initially hyphenated his last name.   He has now " retired"from his long-term employment at [employer]. ->  As of September 2020   He does not smoke, he quit in 2009.  He does not drink alcohol.   He was adopted and no family history is known.     Social Determinants of Health   Financial Resource Strain: Not on file  Food Insecurity: Not on file  Transportation Needs: Not on file  Physical Activity: Not on file  Stress: Not on file  Social Connections: Not on file  Intimate Partner Violence: Not on file    ROS- All systems are reviewed and negative except as per the HPI above.  Physical Exam: Vitals:   06/04/22 1439  BP: 106/80  Pulse: (!) 106  Weight: (!) 147.3 kg  Height: 6\' 1"  (1.854 m)    GEN- The patient is a well appearing male, alert and oriented x 3 today.   HEENT-head normocephalic, atraumatic, sclera clear, conjunctiva pink, hearing intact, trachea midline. Lungs- Clear to ausculation bilaterally, normal work of breathing Heart- irregular rate and rhythm, no murmurs, rubs or gallops  GI- soft, NT, ND, + BS Extremities- no clubbing, cyanosis, or edema MS- no significant deformity or atrophy Skin- no rash or lesion Psych- euthymic mood, full affect Neuro- strength and sensation are intact   Wt Readings from Last 3 Encounters:  06/04/22 (!) 147.3 kg  05/16/22 (!) 144.7 kg  03/27/22 (!) 147.4 kg    EKG today demonstrates  Afib Vent. rate 106 BPM PR interval * ms QRS duration 100 ms QT/QTcB 352/467 ms   Echo 02/21/22 demonstrated   1. EF challenging to assess with afib/ challenging windows. Left  ventricular ejection fraction, by estimation, is 50 to 55%. The left  ventricle has low normal function. The left ventricle demonstrates global  hypokinesis. There is mild concentric left  ventricular hypertrophy. Left ventricular diastolic parameters are  indeterminate.   2. Right ventricular systolic function is normal. The right ventricular  size is normal.  3. Left atrial size was mildly dilated.   4. Trivial mitral valve regurgitation.   5. There is mild calcification of the aortic valve. Aortic valve  regurgitation is not visualized.   6. There is mild dilatation of the ascending aorta,  measuring 36 mm.   7. The inferior vena cava is normal in size with greater than 50%  respiratory variability, suggesting right atrial pressure of 3 mmHg.   Epic records are reviewed at length today  CHA2DS2-VASc Score = 4  The patient's score is based upon: CHF History: 0 HTN History: 1 Diabetes History: 1 Stroke History: 0 Vascular Disease History: 1 Age Score: 1 Gender Score: 0       ASSESSMENT AND PLAN: 1. Persistent Atrial Fibrillation (ICD10:  I48.19) The patient's CHA2DS2-VASc score is 4, indicating a 4.8% annual risk of stroke.   We discussed rhythm control options today. Patient would like to start amiodarone. We did discuss the monitoring parameters for off target effects and he is agreeable to start the medication. He will need to stop Abilify before starting amiodarone, he will discuss with his PCP.  Could potentially be an ablation candidate in the future if he continues to lose weight.  Continue carvedilol 12.5 mg BID Continue Eliquis 5 mg BID  2. Secondary Hypercoagulable State (ICD10:  D68.69) The patient is at significant risk for stroke/thromboembolism based upon his CHA2DS2-VASc Score of 4.  Continue Apixaban (Eliquis).   3. Obesity Body mass index is 42.85 kg/m. Lifestyle modification was discussed and encouraged including regular physical activity and weight reduction. On Mounjaro.   4. Obstructive sleep apnea Encouraged compliance with CPAP therapy.  5. CAD S/p several stents No anginal symptoms.   6. PAD Followed by Dr Kirke Corin  7. HTN Stable, no changes today.   Follow up in the AF clinic in two months, sooner pending timing of amiodarone start.    Jorja Loa PA-C Afib Clinic [facility] [address] [place], [place] [address] [phone] 06/04/2022 2:46 PM

## 2022-06-05 DIAGNOSIS — E1140 Type 2 diabetes mellitus with diabetic neuropathy, unspecified: Secondary | ICD-10-CM | POA: Diagnosis not present

## 2022-06-05 DIAGNOSIS — E1165 Type 2 diabetes mellitus with hyperglycemia: Secondary | ICD-10-CM | POA: Diagnosis not present

## 2022-06-05 NOTE — Telephone Encounter (Signed)
Pt returning call

## 2022-06-05 NOTE — Telephone Encounter (Signed)
Left voicemail for patient to return call to office. 

## 2022-06-06 ENCOUNTER — Telehealth: Payer: Self-pay | Admitting: Cardiology

## 2022-06-06 NOTE — Telephone Encounter (Signed)
Patient is returning call about his Eliquis. Please advise

## 2022-06-06 NOTE — Telephone Encounter (Signed)
Left voicemail call office.  Noted in message it goes straight to VM with no ring so [atient aware.

## 2022-06-06 NOTE — Telephone Encounter (Signed)
Left message for the patient to call the clinic.

## 2022-06-07 MED ORDER — APIXABAN 5 MG PO TABS: 5.0000 mg | ORAL_TABLET | Freq: Two times a day (BID) | ORAL | 0 refills | Status: DC

## 2022-06-07 NOTE — Telephone Encounter (Signed)
Patient is calling stating that he will not be able to make it today for samples, but will come first thing Monday morning.

## 2022-06-07 NOTE — Telephone Encounter (Signed)
Eliquis  BID is correct dosage

## 2022-06-07 NOTE — Telephone Encounter (Signed)
Handled in separate message

## 2022-06-07 NOTE — Telephone Encounter (Signed)
Spoke with patient.  He will pick up samples tonight no later than 4:45pm  The form for assistance is in bag with samples.  He will fill this ASAP and return.

## 2022-06-07 NOTE — Telephone Encounter (Signed)
Patient states he can no longer afford Eliquis as it is now $177.  He states he only has enough doses through Sunday and none for Monday dose.  Advised I would send to see if samples can be given for and if his dosage is correct. If so will have him pick up today or first thing Monday morning.  Spoke with patient regarding assistance program.  He will attempt to do forms at home but one was printed for pick up as well.

## 2022-06-07 NOTE — Telephone Encounter (Signed)
Patient was returning phone call 

## 2022-06-10 NOTE — Telephone Encounter (Signed)
Noted. Thanks.

## 2022-06-13 DIAGNOSIS — Z85828 Personal history of other malignant neoplasm of skin: Secondary | ICD-10-CM | POA: Diagnosis not present

## 2022-06-13 DIAGNOSIS — E11622 Type 2 diabetes mellitus with other skin ulcer: Secondary | ICD-10-CM | POA: Diagnosis not present

## 2022-06-13 DIAGNOSIS — L0109 Other impetigo: Secondary | ICD-10-CM | POA: Diagnosis not present

## 2022-06-13 DIAGNOSIS — I872 Venous insufficiency (chronic) (peripheral): Secondary | ICD-10-CM | POA: Diagnosis not present

## 2022-06-13 DIAGNOSIS — L0889 Other specified local infections of the skin and subcutaneous tissue: Secondary | ICD-10-CM | POA: Diagnosis not present

## 2022-06-18 DIAGNOSIS — I83029 Varicose veins of left lower extremity with ulcer of unspecified site: Secondary | ICD-10-CM | POA: Diagnosis not present

## 2022-06-18 DIAGNOSIS — E1165 Type 2 diabetes mellitus with hyperglycemia: Secondary | ICD-10-CM | POA: Diagnosis not present

## 2022-06-18 DIAGNOSIS — E1151 Type 2 diabetes mellitus with diabetic peripheral angiopathy without gangrene: Secondary | ICD-10-CM | POA: Diagnosis not present

## 2022-06-18 DIAGNOSIS — L97922 Non-pressure chronic ulcer of unspecified part of left lower leg with fat layer exposed: Secondary | ICD-10-CM | POA: Diagnosis not present

## 2022-06-18 DIAGNOSIS — I739 Peripheral vascular disease, unspecified: Secondary | ICD-10-CM | POA: Diagnosis not present

## 2022-06-18 DIAGNOSIS — Z7984 Long term (current) use of oral hypoglycemic drugs: Secondary | ICD-10-CM | POA: Diagnosis not present

## 2022-06-18 DIAGNOSIS — Z794 Long term (current) use of insulin: Secondary | ICD-10-CM | POA: Diagnosis not present

## 2022-06-18 DIAGNOSIS — E11622 Type 2 diabetes mellitus with other skin ulcer: Secondary | ICD-10-CM | POA: Diagnosis not present

## 2022-06-18 DIAGNOSIS — I83028 Varicose veins of left lower extremity with ulcer other part of lower leg: Secondary | ICD-10-CM | POA: Diagnosis not present

## 2022-06-18 DIAGNOSIS — I872 Venous insufficiency (chronic) (peripheral): Secondary | ICD-10-CM | POA: Diagnosis not present

## 2022-06-18 DIAGNOSIS — L97822 Non-pressure chronic ulcer of other part of left lower leg with fat layer exposed: Secondary | ICD-10-CM | POA: Diagnosis not present

## 2022-06-20 DIAGNOSIS — E039 Hypothyroidism, unspecified: Secondary | ICD-10-CM | POA: Diagnosis not present

## 2022-06-20 DIAGNOSIS — E785 Hyperlipidemia, unspecified: Secondary | ICD-10-CM | POA: Diagnosis not present

## 2022-06-20 DIAGNOSIS — Z794 Long term (current) use of insulin: Secondary | ICD-10-CM | POA: Diagnosis not present

## 2022-06-20 DIAGNOSIS — E11622 Type 2 diabetes mellitus with other skin ulcer: Secondary | ICD-10-CM | POA: Diagnosis not present

## 2022-06-20 DIAGNOSIS — I5032 Chronic diastolic (congestive) heart failure: Secondary | ICD-10-CM | POA: Diagnosis not present

## 2022-06-20 DIAGNOSIS — I482 Chronic atrial fibrillation, unspecified: Secondary | ICD-10-CM | POA: Diagnosis not present

## 2022-06-20 DIAGNOSIS — L97909 Non-pressure chronic ulcer of unspecified part of unspecified lower leg with unspecified severity: Secondary | ICD-10-CM | POA: Diagnosis not present

## 2022-06-24 ENCOUNTER — Telehealth (HOSPITAL_COMMUNITY): Payer: Self-pay | Admitting: *Deleted

## 2022-06-24 MED ORDER — AMIODARONE HCL 200 MG PO TABS: ORAL_TABLET | ORAL | 0 refills | Status: DC

## 2022-06-24 NOTE — Telephone Encounter (Signed)
Pt has weaned off abilify per pcp and is ready to pursue amiodarone loading. Discussed with Jorja Loa PA --- amiodarone 200mg  twice a day for 1 month then reduce to once a day.  Follow up in 2 weeks. Pt in agreement.

## 2022-07-01 ENCOUNTER — Other Ambulatory Visit (HOSPITAL_COMMUNITY): Payer: Self-pay

## 2022-07-02 DIAGNOSIS — E1165 Type 2 diabetes mellitus with hyperglycemia: Secondary | ICD-10-CM | POA: Diagnosis not present

## 2022-07-02 DIAGNOSIS — L97822 Non-pressure chronic ulcer of other part of left lower leg with fat layer exposed: Secondary | ICD-10-CM | POA: Diagnosis not present

## 2022-07-02 DIAGNOSIS — E11622 Type 2 diabetes mellitus with other skin ulcer: Secondary | ICD-10-CM | POA: Diagnosis not present

## 2022-07-02 DIAGNOSIS — E1151 Type 2 diabetes mellitus with diabetic peripheral angiopathy without gangrene: Secondary | ICD-10-CM | POA: Diagnosis not present

## 2022-07-02 DIAGNOSIS — I739 Peripheral vascular disease, unspecified: Secondary | ICD-10-CM | POA: Diagnosis not present

## 2022-07-02 DIAGNOSIS — I83028 Varicose veins of left lower extremity with ulcer other part of lower leg: Secondary | ICD-10-CM | POA: Diagnosis not present

## 2022-07-03 ENCOUNTER — Other Ambulatory Visit: Payer: Self-pay | Admitting: *Deleted

## 2022-07-03 DIAGNOSIS — I6523 Occlusion and stenosis of bilateral carotid arteries: Secondary | ICD-10-CM

## 2022-07-05 DIAGNOSIS — E1165 Type 2 diabetes mellitus with hyperglycemia: Secondary | ICD-10-CM | POA: Diagnosis not present

## 2022-07-05 DIAGNOSIS — E1140 Type 2 diabetes mellitus with diabetic neuropathy, unspecified: Secondary | ICD-10-CM | POA: Diagnosis not present

## 2022-07-10 ENCOUNTER — Ambulatory Visit (HOSPITAL_COMMUNITY)
Admission: RE | Admit: 2022-07-10 | Discharge: 2022-07-10 | Disposition: A | Discharge status: Home / Self Care | Payer: Medicare Other | Source: Ambulatory Visit | Attending: Physician Assistant | Admitting: Physician Assistant

## 2022-07-10 VITALS — BP 114/70 | HR 76 | Ht 73.0 in | Wt 319.0 lb

## 2022-07-10 DIAGNOSIS — E669 Obesity, unspecified: Secondary | ICD-10-CM | POA: Diagnosis not present

## 2022-07-10 DIAGNOSIS — N182 Chronic kidney disease, stage 2 (mild): Secondary | ICD-10-CM | POA: Diagnosis not present

## 2022-07-10 DIAGNOSIS — Z794 Long term (current) use of insulin: Secondary | ICD-10-CM | POA: Diagnosis not present

## 2022-07-10 DIAGNOSIS — I2510 Atherosclerotic heart disease of native coronary artery without angina pectoris: Secondary | ICD-10-CM | POA: Insufficient documentation

## 2022-07-10 DIAGNOSIS — I129 Hypertensive chronic kidney disease with stage 1 through stage 4 chronic kidney disease, or unspecified chronic kidney disease: Secondary | ICD-10-CM | POA: Diagnosis not present

## 2022-07-10 DIAGNOSIS — Z5181 Encounter for therapeutic drug level monitoring: Secondary | ICD-10-CM

## 2022-07-10 DIAGNOSIS — I739 Peripheral vascular disease, unspecified: Secondary | ICD-10-CM | POA: Diagnosis not present

## 2022-07-10 DIAGNOSIS — I4819 Other persistent atrial fibrillation: Secondary | ICD-10-CM

## 2022-07-10 DIAGNOSIS — I517 Cardiomegaly: Secondary | ICD-10-CM | POA: Diagnosis not present

## 2022-07-10 DIAGNOSIS — Z87891 Personal history of nicotine dependence: Secondary | ICD-10-CM | POA: Insufficient documentation

## 2022-07-10 DIAGNOSIS — Z6841 Body Mass Index (BMI) 40.0 and over, adult: Secondary | ICD-10-CM | POA: Insufficient documentation

## 2022-07-10 DIAGNOSIS — G4733 Obstructive sleep apnea (adult) (pediatric): Secondary | ICD-10-CM | POA: Diagnosis not present

## 2022-07-10 DIAGNOSIS — E1122 Type 2 diabetes mellitus with diabetic chronic kidney disease: Secondary | ICD-10-CM | POA: Diagnosis not present

## 2022-07-10 DIAGNOSIS — D6869 Other thrombophilia: Secondary | ICD-10-CM

## 2022-07-10 DIAGNOSIS — Z7984 Long term (current) use of oral hypoglycemic drugs: Secondary | ICD-10-CM | POA: Insufficient documentation

## 2022-07-10 DIAGNOSIS — Z79899 Other long term (current) drug therapy: Secondary | ICD-10-CM | POA: Diagnosis not present

## 2022-07-10 LAB — COMPREHENSIVE METABOLIC PANEL
ALT: 22 U/L (ref 0–44)
AST: 24 U/L (ref 15–41)
Albumin: 3.3 g/dL — ABNORMAL LOW (ref 3.5–5.0)
Alkaline Phosphatase: 75 U/L (ref 38–126)
Anion gap: 12 (ref 5–15)
BUN: 23 mg/dL (ref 8–23)
CO2: 25 mmol/L (ref 22–32)
Calcium: 8.7 mg/dL — ABNORMAL LOW (ref 8.9–10.3)
Chloride: 101 mmol/L (ref 98–111)
Creatinine, Ser: 2.16 mg/dL — ABNORMAL HIGH (ref 0.61–1.24)
GFR, Estimated: 33 mL/min — ABNORMAL LOW (ref >60)
Glucose, Bld: 221 mg/dL — ABNORMAL HIGH (ref 70–99)
Potassium: 4.1 mmol/L (ref 3.5–5.1)
Sodium: 138 mmol/L (ref 135–145)
Total Bilirubin: 0.8 mg/dL (ref 0.3–1.2)
Total Protein: 5.7 g/dL — ABNORMAL LOW (ref 6.5–8.1)

## 2022-07-10 LAB — CBC
HCT: 45.3 % (ref 39.0–52.0)
Hemoglobin: 15.4 g/dL (ref 13.0–17.0)
MCH: 30.1 pg (ref 26.0–34.0)
MCHC: 34 g/dL (ref 30.0–36.0)
MCV: 88.6 fL (ref 80.0–100.0)
Platelets: 129 10*3/uL — ABNORMAL LOW (ref 150–400)
RBC: 5.11 MIL/uL (ref 4.22–5.81)
RDW: 15 % (ref 11.5–15.5)
WBC: 5.4 10*3/uL (ref 4.0–10.5)
nRBC: 0 % (ref 0.0–0.2)

## 2022-07-10 LAB — TSH: TSH: 3.665 u[IU]/mL (ref 0.350–4.500)

## 2022-07-10 MED ORDER — AMIODARONE HCL 200 MG PO TABS: 200.0000 mg | ORAL_TABLET | Freq: Every day | ORAL | 1 refills | Status: DC

## 2022-07-10 NOTE — Patient Instructions (Signed)
June 3rd decrease amiodarone to 200mg  once a day   HOLD Mounjaro week prior to cardioversion    Cardioversion scheduled for: Wednesday, June 5th   - Arrive at the [facility] and go to admitting at [facility] not eat or drink anything after midnight the night prior to your procedure.   - Take all your morning medication (except diabetic medications) with a sip of water prior to arrival.  - You will not be able to drive home after your procedure.    - Do NOT miss any doses of your blood thinner - if you should miss a dose please notify our office immediately.   - If you feel as if you go back into normal rhythm prior to scheduled cardioversion, please notify our office immediately.   If your procedure is canceled in the cardioversion suite you will be charged a cancellation fee.    Hold medication 7 days prior to scheduled procedure/anesthesia.  Restart medication on the normal dosing day after scheduled procedure/anesthesia  Dulaglutide (Trulicity) Exenatide extended release (Bydureon bcise) Semaglutide (Ozempic) (WEGOVY)  Tirzepatide (Mounjaro)     Hold medication 24 hours prior to scheduled procedure/anesthesia.   Restart medication on the following day after scheduled procedure/anesthesia   Exenatide (Byetta)  Liraglutide (Victoza, Saxenda)  Lixisenatide (Adlyxin)  Semaglutide (Rybelsus) Polyethylene Glycol Loxenatide   For those patients who have a scheduled procedure/anesthesia on the same day of the week as their dose, hold the medication on the day of surgery.  They can take their scheduled dose the week before.  **Patients on the above medications scheduled for elective procedures that have not held the medication for the appropriate amount of time are at risk of cancellation or change in the anesthetic plan.

## 2022-07-10 NOTE — Progress Notes (Signed)
Primary Care Physician: [name], MD Primary Cardiologist: Dr Herbie Baltimore  Primary Electrophysiologist: Dr Nelly Laurence Referring Physician: Dr Ewell Poe is a 67 y.o. male with a history of carotid disease, obstructive sleep apnea on CPAP, peripheral arterial disease, essential hypertension, obesity, chronic kidney disease, type 2 diabetes, CAD, hyperlipidemia, atrial fibrillation who presents for follow up in the [facility].  The patient was initially diagnosed with atrial fibrillation 01/28/22 at his visit with Dr Herbie Baltimore. Patient had noticed more fatigue with exertion for the previous two months but was otherwise unaware of his arrhythmia. He was started on Eliquis for a CHADS2VASC score of 4. An echo and cardiac monitor were also ordered. He denies significant alcohol use and is compliant with his CPAP. The cardiac monitor showed 100% afib burden.  Patient is s/p DCCV on 03/13/22. Patient reports that after his DCCV he felt less SOB. He was back out of rhythm on follow up 03/27/22. Seen by Dr Nelly Laurence to discuss rhythm control options who recommended medical therapy. He was able to wean off Abilify and started on amiodarone 06/24/22.  On follow up today, patient remains in rate controlled afib. No changes in his symptoms of SOB and fatigue. He is tolerating amiodarone without difficulty. No bleeding issues on anticoagulation.   Today, he denies symptoms of palpitations, chest pain, orthopnea, PND, lower extremity edema, dizziness, presyncope, syncope, bleeding, or neurologic sequela. The patient is tolerating medications without difficulties and is otherwise without complaint today.    Atrial Fibrillation Risk Factors:  he does have symptoms or diagnosis of sleep apnea. he is compliant with CPAP therapy. he does not have a history of rheumatic fever. he does not have a history of alcohol use.   he has a BMI of Body mass index is 42.09  kg/m.Marland Kitchen Filed Weights   07/10/22 1406  Weight: (!) 144.7 kg   Family History  Adopted: Yes  Family history unknown: Yes     Atrial Fibrillation Management history:  Previous antiarrhythmic drugs: amiodarone  Previous cardioversions: 03/13/22 Previous ablations: none CHADS2VASC score: 4 Anticoagulation history: Eliquis   Past Medical History:  Diagnosis Date   Anginal pain [facility]) March 2015   Cardiac cath showed patent stents with distal LAD, circumflex-OM and RCA disease in small vessels.   Anxiety    CAD S/P percutaneous coronary angioplasty 2003, 04/2012   status post PCI to LAD, circumflex-OM 2, RCA   Carotid artery occlusion    Chronic renal insufficiency, stage II (mild)    Chronic venous insufficiency    varicosities, no reflux; dopplers 04/14/12- valvular insufficiency in the R and L GSV   Complication of anesthesia    COPD, mild (HCC)    Depression    situaltional    Diabetes mellitus    Diabetic neuropathy (HCC) 08/24/2019   Dyslipidemia associated with type 2 diabetes mellitus (HCC)    GERD (gastroesophageal reflux disease)    HTN (hypertension)    Hypothyroidism    Neuromuscular disorder (HCC)    neuropathy in feet   Neuropathy    notably improved following PCI with improved cardiac function   Obesity    Obesity, Class II, BMI 35.0-39.9, with comorbidity (see actual BMI)    BMI 39; wgt loss efforts in place; seeing Dietician   PONV (postoperative nausea and vomiting)    "[employer]"   Pulmonary hypertension (HCC) 04/2012   PA pressure   Sleep apnea    uses nightly  Past Surgical History:  Procedure Laterality Date   ABDOMINAL AORTAGRAM N/A 11/15/2011   Procedure: ABDOMINAL Ronny Flurry;  Surgeon: Sherren Kerns, MD;  Location[facility] CATH LAB;  Service: Cardiovascular;  [name];   ABDOMINAL AORTOGRAM W/LOWER EXTREMITY N/A 10/13/2019   Procedure: ABDOMINAL AORTOGRAM W/LOWER EXTREMITY;  Surgeon: Iran Ouch, MD;  Location: [facility] INVASIVE CV  LAB;  Service: CV: Non-obst Ao-Iliac. R SFA-PopA patent with significant calcific stenosis of TP trunk-prox PTA (dominant) & CTO ATA  -> R PTA-TP PTCA   ABIs  04/27/2012   mild bilateral arterial insufficiency   BACK SURGERY  2005 x1   X2-2010   BUNIONECTOMY Right 11/11/2013   Procedure: RIGHT FOOT SILVER BUNIONECTOMY;  Surgeon: Toni Arthurs, MD;  Location: [facility];  Service: Orthopedics;  Laterality: Right;   CARDIAC CATHETERIZATION  12/11/2001   significant 3V CAD, normal LV function   CARDIOVERSION N/A 03/13/2022   Procedure: CARDIOVERSION;  Surgeon: Little Ishikawa, MD;  Location: [facility] ENDOSCOPY;  Service: Cardiovascular;  [name];   Carotid Doppler  04/27/2012   right internal carotid: Elevated velocities but no evidence of plaque. Left internal carotid 40-59%   CAROTID ENDARTERECTOMY  2005   Right; recent carotid Dopplers notes elevated velocities.    colonscopy     CORONARY ANGIOPLASTY  12/21/2001   PTCA of the distal and mid AV groove circ, unsuccessful PTCA of second OM total occlusion, unsuccessful PTCA of the apical LAD total occlusion   CORONARY ANGIOPLASTY WITH STENT PLACEMENT  04/29/2012   PCI to 3 RCA lesions, Promus Premiere 2.8mmx8mm distally, mid was 2.74mx28mm and proximally 2.75x71mm, EF 55-60%   CORONARY STENT PLACEMENT  04/28/2012   PCI to LAD (3x50mm Xience DES postdilated to 3.25) and circ prox and mid (2 overlappinmg 2.36mmx12mmXience DES postdilated to 2.45mm)   DOPPLER ECHOCARDIOGRAPHY  04/28/2012   poor quality study: EF estimated 60-65%; unable to assess diastolic function (previously noted to have diastolic dysfunction); severely dilated left atrium and mild right atrium; dilated IVC consistent with increased central venous pressure.Marland Kitchen   HERNIA REPAIR     LEFT AND RIGHT HEART CATHETERIZATION WITH CORONARY ANGIOGRAM N/A 04/27/2012   Procedure: LEFT AND RIGHT HEART CATHETERIZATION WITH CORONARY ANGIOGRAM;  Surgeon: Marykay Lex, MD;   Location: [facility] CATH LAB;  Service: Cardiovascular;  [name];   LEFT AND RIGHT HEART CATHETERIZATION WITH CORONARY ANGIOGRAM N/A 05/14/2013   Procedure: LEFT AND RIGHT HEART CATHETERIZATION WITH CORONARY ANGIOGRAM;  Surgeon: Lennette Bihari, MD;  [name]; PCWP ;; [name], [name], Patent RCA ostial mid and distal stents - 70% distal runoff Dz   LUMBAR LAMINECTOMY/DECOMPRESSION MICRODISCECTOMY  01/07/2012   Procedure: LUMBAR LAMINECTOMY/DECOMPRESSION MICRODISCECTOMY 1 LEVEL;  Surgeon: Carmela Hurt, MD;  [name];  Service: Neurosurgery;  Laterality: Bilateral;   Lumbar Three-Four Decompression   LUMBAR LAMINECTOMY/DECOMPRESSION MICRODISCECTOMY N/A 07/26/2020   Procedure: Lumbar two-three Laminectomy/Foraminotomy;  Surgeon: Coletta Memos, MD;  [name];  Service: Neurosurgery;  [name];   PERCUTANEOUS CORONARY STENT INTERVENTION (PCI-S) N/A 04/28/2012   Procedure: PERCUTANEOUS CORONARY STENT INTERVENTION (PCI-S);  Surgeon: Lennette Bihari, MD;  Location: [facility] CATH LAB;  Service: Cardiovascular;  [name];   PERCUTANEOUS CORONARY STENT INTERVENTION (PCI-S) N/A 04/29/2012   Procedure: PERCUTANEOUS CORONARY STENT INTERVENTION (PCI-S);  Surgeon: Marykay Lex, MD;  Location: [facility] CATH  LAB;  Service: Cardiovascular;  [name];   PERIPHERAL VASCULAR BALLOON ANGIOPLASTY Right 10/13/2019   Procedure: PERIPHERAL VASCULAR BALLOON ANGIOPLASTY;  Surgeon: Iran Ouch, MD;  Location: [facility] INVASIVE CV LAB;  Service: Cardiovascular;  Laterality: Right;  PTCA of R TP Trunk into PTA (Drug-coated) => Follow-up ABIs 0.9 on the right and 0.99 on the left.   SPINE SURGERY     UMBILICAL HERNIA REPAIR  2009   steel mesh insert    Current Outpatient Medications  Medication Sig Dispense Refill   allopurinol (ZYLOPRIM)  100 MG tablet Take 200 mg by mouth 2 (two) times daily.     apixaban (ELIQUIS) 5 MG TABS tablet Take 1 tablet (5 mg total) by mouth 2 (two) times daily. 28 tablet 0   B-D ULTRAFINE III SHORT PEN 31G X 8 MM MISC Inject 1 each into the skin 3 (three) times daily.     carvedilol (COREG) 12.5 MG tablet TAKE 1 TABLET (12.5MG  TOTAL) BY MOUTH TWICE A DAY WITH MEALS (Patient taking differently: Taking 6.25 mg by mouth twice daily) 180 tablet 3   COLCRYS 0.6 MG tablet Take 0.6 mg by mouth daily.     Continuous Blood Gluc Sensor (FREESTYLE LIBRE 14 DAY SENSOR) MISC APPLY 1 SENSOR EVERY 14 DAYS  11   DENTA 5000 PLUS 1.1 % CREA dental cream Take 1 Application by mouth daily.     DULoxetine (CYMBALTA) 60 MG capsule Take 60 mg by mouth daily.     ezetimibe (ZETIA) 10 MG tablet Take 10 mg by mouth daily.     FARXIGA 10 MG TABS tablet Take 10 mg by mouth daily.     furosemide (LASIX) 80 MG tablet Take 160 mg by mouth 2 (two) times daily.     gabapentin (NEURONTIN) 800 MG tablet Take 300 mg by mouth 3 (three) times daily.     insulin regular human CONCENTRATED (HUMULIN R U-500 KWIKPEN) 500 UNIT/ML kwikpen Inject 100-200 Units into the skin See admin instructions. 70 units in the morning and 150 units at night     levothyroxine (SYNTHROID, LEVOTHROID) 88 MCG tablet Take 88 mcg by mouth daily before breakfast.     losartan (COZAAR) 25 MG tablet Take 12.5 mg by mouth daily.     meclizine (ANTIVERT) 25 MG tablet Taking 37.5mg  by mouth twice daily and 25 mg at night     mupirocin ointment (BACTROBAN) 2 % APPLY TO AFFECTED AREA TWICE A DAY (Patient taking differently: Apply 1 Application topically 2 (two) times daily as needed (wound care).) 22 g 2   nitroGLYCERIN (NITROSTAT) 0.4 MG SL tablet PLACE 1 TAB UNDER THE TONGUE EVERY 5 MINUTES AS NEEDED FOR CHEST PAIN (SEVERE PRESSURE OR TIGHTNESS) 25 tablet 3   omeprazole (PRILOSEC) 20 MG capsule TAKE 1 CAPSULE BY MOUTH EVERY DAY 90 capsule 3   potassium chloride (MICRO-K) 10  MEQ CR capsule Take 10 mEq by mouth daily.   5   rosuvastatin (CRESTOR) 20 MG tablet Take 20 mg by mouth daily.     tirzepatide (MOUNJARO) 15 MG/0.5ML Pen Inject 15 mg into the skin once a week. 6 mL 3   TRESIBA FLEXTOUCH 200 UNIT/ML FlexTouch Pen Inject 50 Units into the skin daily.     amiodarone (PACERONE) 200 MG tablet Take 1 tablet (200 mg total) by mouth daily. 90 tablet 1   tirzepatide (MOUNJARO) 12.5 MG/0.5ML Pen Inject 12.5 mg into the skin once a week. (Patient not taking: Reported on 07/10/2022) 2 mL 2  tirzepatide (MOUNJARO) 12.5 MG/0.5ML Pen Inject 12.5 mg into the skin once a week. (Patient not taking: Reported on 07/10/2022) 2 mL 11   tirzepatide (MOUNJARO) 7.5 MG/0.5ML Pen Inject 7.5 mg into the skin once a week. (Patient not taking: Reported on 07/10/2022) 2 mL 2   No current facility-administered medications for this encounter.    Allergies  Allergen Reactions   Septra [Bactrim] Itching   Penicillins Hives    Allergy to All cillin drugs  Ancef given 9/24 with no obvious reaction   Metformin And Related Diarrhea and Nausea Only   Other Nausea And Vomiting    General Anesthesia - sometimes causes nausea and vomiting    Sulfamethoxazole-Trimethoprim Other (See Comments)   Wellbutrin [Bupropion]     Mood Changes    Social History   Socioeconomic History   Marital status: Married    Spouse name: Not on file   Number of children: 0   Years of education: hs   Highest education level: Not on file  Occupational History   Occupation: Surveyor, quantity: replacement limited  Tobacco Use   Smoking status: Former    Packs/day: 1.00    Years: 42.00    Additional pack years: 0.00    Total pack years: 42.00    Types: Cigarettes    Quit date: 03/23/2003    Years since quitting: 19.3   Smokeless tobacco: Never   Tobacco comments:    Former smoker 02/19/22  Vaping Use   Vaping Use: Never used  Substance and Sexual Activity   Alcohol use: No     Alcohol/week: 0.0 standard drinks of alcohol   Drug use: No   Sexual activity: Not on file  Other Topics Concern   Not on file  Social History Narrative   He and his partner [name] have now officially gotten married on 05/26/2013.  [name] is also a patient of our clinic.  He is now initially hyphenated his last name.   He has now " retired"from his long-term employment at [employer]. ->  As of September 2020   He does not smoke, he quit in 2009.  He does not drink alcohol.   He was adopted and no family history is known.    Social Determinants of Health   Financial Resource Strain: Not on file  Food Insecurity: Not on file  Transportation Needs: Not on file  Physical Activity: Not on file  Stress: Not on file  Social Connections: Not on file  Intimate Partner Violence: Not on file    ROS- All systems are reviewed and negative except as per the HPI above.  Physical Exam: Vitals:   07/10/22 1406  BP: 114/70  Pulse: 76  Weight: (!) 144.7 kg  Height: 6\' 1"  (1.854 m)    GEN- The patient is a well appearing male, alert and oriented x 3 today.   HEENT-head normocephalic, atraumatic, sclera clear, conjunctiva pink, hearing intact, trachea midline. Lungs- Clear to ausculation bilaterally, normal work of breathing Heart- irregular rate and rhythm, no murmurs, rubs or gallops  GI- soft, NT, ND, + BS Extremities- no clubbing, cyanosis, or edema MS- no significant deformity or atrophy Skin- no rash or lesion Psych- euthymic mood, full affect Neuro- strength and sensation are intact   Wt Readings from Last 3 Encounters:  07/10/22 (!) 144.7 kg  06/04/22 (!) 147.3 kg  05/16/22 (!) 144.7 kg    EKG today demonstrates  Afib Vent. rate 76 BPM PR interval * ms  QRS duration 108 ms QT/QTcB 400/450 ms   Echo 02/21/22 demonstrated   1. EF challenging to assess with afib/ challenging windows. Left  ventricular ejection fraction, by estimation, is 50 to 55%. The left   ventricle has low normal function. The left ventricle demonstrates global  hypokinesis. There is mild concentric left  ventricular hypertrophy. Left ventricular diastolic parameters are  indeterminate.   2. Right ventricular systolic function is normal. The right ventricular  size is normal.   3. Left atrial size was mildly dilated.   4. Trivial mitral valve regurgitation.   5. There is mild calcification of the aortic valve. Aortic valve  regurgitation is not visualized.   6. There is mild dilatation of the ascending aorta, measuring 36 mm.   7. The inferior vena cava is normal in size with greater than 50%  respiratory variability, suggesting right atrial pressure of 3 mmHg.   Epic records are reviewed at length today  CHA2DS2-VASc Score = 4  The patient's score is based upon: CHF History: 0 HTN History: 1 Diabetes History: 1 Stroke History: 0 Vascular Disease History: 1 Age Score: 1 Gender Score: 0       ASSESSMENT AND PLAN: 1. Persistent Atrial Fibrillation (ICD10:  I48.19) The patient's CHA2DS2-VASc score is 4, indicating a 4.8% annual risk of stroke.   Continue amiodarone 200 mg BID for a total of one month, then decrease to once daily. Will arrange for DCCV after amiodarone load.  Check cmet/cbc/TSH and CXR. Could potentially be an ablation candidate in the future if he continues to lose weight.  Continue carvedilol 12.5 mg BID Continue Eliquis 5 mg BID  2. Secondary Hypercoagulable State (ICD10:  D68.69) The patient is at significant risk for stroke/thromboembolism based upon his CHA2DS2-VASc Score of 4.  Continue Apixaban (Eliquis).   3. Obesity Body mass index is 42.09 kg/m. Lifestyle modification was discussed and encouraged including regular physical activity and weight reduction. On Mounjaro.   4. Obstructive sleep apnea Encouraged compliance with CPAP therapy.  5. CAD S/p several stents No anginal symptoms.  6. PAD Followed by Dr Kirke Corin.  7.  HTN Stable, no changes today.   Follow up in the AF clinic post DCCV.    Jorja Loa PA-C Afib [facility] [address] [place], [place] [address] [phone] 07/10/2022 5:00 PM

## 2022-07-10 NOTE — H&P (View-Only) (Signed)
  Primary Care Physician: Pharr, Walter, MD Primary Cardiologist: Dr Harding  Primary Electrophysiologist: Dr Mealor Referring Physician: Dr Harding    Harold [name] is a 66 y.o. male with a history of carotid disease, obstructive sleep apnea on CPAP, peripheral arterial disease, essential hypertension, obesity, chronic kidney disease, type 2 diabetes, CAD, hyperlipidemia, atrial fibrillation who presents for follow up in the [facility].  The patient was initially diagnosed with atrial fibrillation 01/28/22 at his visit with Dr Harding. Patient had noticed more fatigue with exertion for the previous two months but was otherwise unaware of his arrhythmia. He was started on Eliquis for a CHADS2VASC score of 4. An echo and cardiac monitor were also ordered. He denies significant alcohol use and is compliant with his CPAP. The cardiac monitor showed 100% afib burden.  Patient is s/p DCCV on 03/13/22. Patient reports that after his DCCV he felt less SOB. He was back out of rhythm on follow up 03/27/22. Seen by Dr Mealor to discuss rhythm control options who recommended medical therapy. He was able to wean off Abilify and started on amiodarone 06/24/22.  On follow up today, patient remains in rate controlled afib. No changes in his symptoms of SOB and fatigue. He is tolerating amiodarone without difficulty. No bleeding issues on anticoagulation.   Today, he denies symptoms of palpitations, chest pain, orthopnea, PND, lower extremity edema, dizziness, presyncope, syncope, bleeding, or neurologic sequela. The patient is tolerating medications without difficulties and is otherwise without complaint today.    Atrial Fibrillation Risk Factors:  he does have symptoms or diagnosis of sleep apnea. he is compliant with CPAP therapy. he does not have a history of rheumatic fever. he does not have a history of alcohol use.   he has a BMI of Body mass index is 42.09  kg/m.. Filed Weights   07/10/22 1406  Weight: (!) 144.7 kg   Family History  Adopted: Yes  Family history unknown: Yes     Atrial Fibrillation Management history:  Previous antiarrhythmic drugs: amiodarone  Previous cardioversions: 03/13/22 Previous ablations: none CHADS2VASC score: 4 Anticoagulation history: Eliquis   Past Medical History:  Diagnosis Date   Anginal pain (HCC) March 2015   Cardiac cath showed patent stents with distal LAD, circumflex-OM and RCA disease in small vessels.   Anxiety    CAD S/P percutaneous coronary angioplasty 2003, 04/2012   status post PCI to LAD, circumflex-OM 2, RCA   Carotid artery occlusion    Chronic renal insufficiency, stage II (mild)    Chronic venous insufficiency    varicosities, no reflux; dopplers 04/14/12- valvular insufficiency in the R and L GSV   Complication of anesthesia    COPD, mild (HCC)    Depression    situaltional    Diabetes mellitus    Diabetic neuropathy (HCC) 08/24/2019   Dyslipidemia associated with type 2 diabetes mellitus (HCC)    GERD (gastroesophageal reflux disease)    HTN (hypertension)    Hypothyroidism    Neuromuscular disorder (HCC)    neuropathy in feet   Neuropathy    notably improved following PCI with improved cardiac function   Obesity    Obesity, Class II, BMI 35.0-39.9, with comorbidity (see actual BMI)    BMI 39; wgt loss efforts in place; seeing Dietician   PONV (postoperative nausea and vomiting)    "[employer]"   Pulmonary hypertension (HCC) 04/2012   PA pressure 58mmhg   Sleep apnea    uses nightly     Past Surgical History:  Procedure Laterality Date   ABDOMINAL AORTAGRAM N/A 11/15/2011   Procedure: ABDOMINAL AORTAGRAM;  Surgeon: Charles E Fields, MD;  Location: [facility];  Service: Cardiovascular;  [name];   ABDOMINAL AORTOGRAM W/LOWER EXTREMITY N/A 10/13/2019   Procedure: ABDOMINAL AORTOGRAM W/LOWER EXTREMITY;  Surgeon: Arida, Muhammad A, MD;  Location: [facility] IN[facility];  Service: CV: Non-obst Ao-Iliac. R SFA-PopA patent with significant calcific stenosis of TP trunk-prox PTA (dominant) & CTO ATA  -> R PTA-TP PTCA   ABIs  04/27/2012   mild bilateral arterial insufficiency   BACK SURGERY  2005 x1   X2-2010   BUNIONECTOMY Right 11/11/2013   Procedure: RIGHT FOOT SILVER BUNIONECTOMY;  Surgeon: John [name], MD;  Location: [facility];  Service: Orthopedics;  Laterality: Right;   CARDIAC CATHETERIZATION  12/11/2001   significant 3V CAD, normal LV function   CARDIOVERSION N/A 03/13/2022   Procedure: CARDIOVERSION;  Surgeon: Schumann, Christopher L, MD;  Location: [facility];  Service: Cardiovascular;  [name];   Carotid Doppler  04/27/2012   right internal carotid: Elevated velocities but no evidence of plaque. Left internal carotid 40-59%   CAROTID ENDARTERECTOMY  2005   Right; recent carotid Dopplers notes elevated velocities.    colonscopy     CORONARY ANGIOPLASTY  12/21/2001   PTCA of the distal and mid AV groove circ, unsuccessful PTCA of second OM total occlusion, unsuccessful PTCA of the apical LAD total occlusion   CORONARY ANGIOPLASTY WITH STENT PLACEMENT  04/29/2012   PCI to 3 RCA lesions, Promus Premiere 2.25mmx8mm distally, mid was 2.5mx28mm and proximally 2.75x16mm, EF 55-60%   CORONARY STENT PLACEMENT  04/28/2012   PCI to LAD (3x23mm Xience DES postdilated to 3.25) and circ prox and mid (2 overlappinmg 2.5mmx12mmXience DES postdilated to 2.75mm)   DOPPLER ECHOCARDIOGRAPHY  04/28/2012   poor quality study: EF estimated 60-65%; unable to assess diastolic function (previously noted to have diastolic dysfunction); severely dilated left atrium and mild right atrium; dilated IVC consistent with increased central venous pressure..   HERNIA REPAIR     LEFT AND RIGHT HEART CATHETERIZATION WITH CORONARY ANGIOGRAM N/A 04/27/2012   Procedure: LEFT AND RIGHT HEART CATHETERIZATION WITH CORONARY ANGIOGRAM;  Surgeon: David W Harding, MD;   Location: [facility];  Service: Cardiovascular;  [name];   LEFT AND RIGHT HEART CATHETERIZATION WITH CORONARY ANGIOGRAM N/A 05/14/2013   Procedure: LEFT AND RIGHT HEART CATHETERIZATION WITH CORONARY ANGIOGRAM;  Surgeon: Thomas A Kelly, MD;  [name]; PCWP 22mmHg;; [name], [name], Patent RCA ostial mid and distal stents - 70% distal runoff Dz   LUMBAR LAMINECTOMY/DECOMPRESSION MICRODISCECTOMY  01/07/2012   Procedure: LUMBAR LAMINECTOMY/DECOMPRESSION MICRODISCECTOMY 1 LEVEL;  Surgeon: Kyle L Cabbell, MD;  [name];  Service: Neurosurgery;  Laterality: Bilateral;   Lumbar Three-Four Decompression   LUMBAR LAMINECTOMY/DECOMPRESSION MICRODISCECTOMY N/A 07/26/2020   Procedure: Lumbar two-three Laminectomy/Foraminotomy;  Surgeon: Cabbell, Kyle, MD;  [name];  Service: Neurosurgery;  [name];   PERCUTANEOUS CORONARY STENT INTERVENTION (PCI-S) N/A 04/28/2012   Procedure: PERCUTANEOUS CORONARY STENT INTERVENTION (PCI-S);  Surgeon: Thomas A Kelly, MD;  [facility]: [facility];  Service: Cardiovascular;  [name];   PERCUTANEOUS CORONARY STENT INTERVENTION (PCI-S) N/A 04/29/2012   Procedure: PERCUTANEOUS CORONARY STENT INTERVENTION (PCI-S);  Surgeon: David W Harding, MD;  Location: [facility];  Service: Cardiovascular;  [name];   PERIPHERAL VASCULAR BALLOON ANGIOPLASTY Right 10/13/2019   Procedure: PERIPHERAL VASCULAR BALLOON ANGIOPLASTY;  Surgeon: Arida, Muhammad A, MD;  [facility]: [facility];  Service: Cardiovascular;  Laterality: Right;  PTCA of R TP Trunk into PTA (Drug-coated) => Follow-up ABIs 0.9 on the right and 0.99 on the left.   SPINE SURGERY     UMBILICAL HERNIA REPAIR  2009   steel mesh insert    Current Outpatient Medications  Medication Sig Dispense Refill   allopurinol (ZYLOPRIM)  100 MG tablet Take 200 mg by mouth 2 (two) times daily.     apixaban (ELIQUIS) 5 MG TABS tablet Take 1 tablet (5 mg total) by mouth 2 (two) times daily. 28 tablet 0   B-D ULTRAFINE III SHORT PEN 31G X 8 MM MISC Inject 1 each into the skin 3 (three) times daily.     carvedilol (COREG) 12.5 MG tablet TAKE 1 TABLET (12.5MG TOTAL) BY MOUTH TWICE A DAY WITH MEALS (Patient taking differently: Taking 6.25 mg by mouth twice daily) 180 tablet 3   COLCRYS 0.6 MG tablet Take 0.6 mg by mouth daily.     Continuous Blood Gluc Sensor (FREESTYLE LIBRE 14 DAY SENSOR) MISC APPLY 1 SENSOR EVERY 14 DAYS  11   DENTA 5000 PLUS 1.1 % CREA dental cream Take 1 Application by mouth daily.     DULoxetine (CYMBALTA) 60 MG capsule Take 60 mg by mouth daily.     ezetimibe (ZETIA) 10 MG tablet Take 10 mg by mouth daily.     FARXIGA 10 MG TABS tablet Take 10 mg by mouth daily.     furosemide (LASIX) 80 MG tablet Take 160 mg by mouth 2 (two) times daily.     gabapentin (NEURONTIN) 800 MG tablet Take 300 mg by mouth 3 (three) times daily.     insulin regular human CONCENTRATED (HUMULIN R U-500 KWIKPEN) 500 UNIT/ML kwikpen Inject 100-200 Units into the skin See admin instructions. 70 units in the morning and 150 units at night     levothyroxine (SYNTHROID, LEVOTHROID) 88 MCG tablet Take 88 mcg by mouth daily before breakfast.     losartan (COZAAR) 25 MG tablet Take 12.5 mg by mouth daily.     meclizine (ANTIVERT) 25 MG tablet Taking 37.5mg by mouth twice daily and 25 mg at night     mupirocin ointment (BACTROBAN) 2 % APPLY TO AFFECTED AREA TWICE A DAY (Patient taking differently: Apply 1 Application topically 2 (two) times daily as needed (wound care).) 22 g 2   nitroGLYCERIN (NITROSTAT) 0.4 MG SL tablet PLACE 1 TAB UNDER THE TONGUE EVERY 5 MINUTES AS NEEDED FOR CHEST PAIN (SEVERE PRESSURE OR TIGHTNESS) 25 tablet 3   omeprazole (PRILOSEC) 20 MG capsule TAKE 1 CAPSULE BY MOUTH EVERY DAY 90 capsule 3   potassium chloride (MICRO-K) 10  MEQ CR capsule Take 10 mEq by mouth daily.   5   rosuvastatin (CRESTOR) 20 MG tablet Take 20 mg by mouth daily.     tirzepatide (MOUNJARO) 15 MG/0.5ML Pen Inject 15 mg into the skin once a week. 6 mL 3   TRESIBA FLEXTOUCH 200 UNIT/ML FlexTouch Pen Inject 50 Units into the skin daily.     amiodarone (PACERONE) 200 MG tablet Take 1 tablet (200 mg total) by mouth daily. 90 tablet 1   tirzepatide (MOUNJARO) 12.5 MG/0.5ML Pen Inject 12.5 mg into the skin once a week. (Patient not taking: Reported on 07/10/2022) 2 mL 2     tirzepatide (MOUNJARO) 12.5 MG/0.5ML Pen Inject 12.5 mg into the skin once a week. (Patient not taking: Reported on 07/10/2022) 2 mL 11   tirzepatide (MOUNJARO) 7.5 MG/0.5ML Pen Inject 7.5 mg into the skin once a week. (Patient not taking: Reported on 07/10/2022) 2 mL 2   No current facility-administered medications for this encounter.    Allergies  Allergen Reactions   Septra [Bactrim] Itching   Penicillins Hives    Allergy to All cillin drugs  Ancef given 9/24 with no obvious reaction   Metformin And Related Diarrhea and Nausea Only   Other Nausea And Vomiting    General Anesthesia - sometimes causes nausea and vomiting    Sulfamethoxazole-Trimethoprim Other (See Comments)   Wellbutrin [Bupropion]     Mood Changes    Social History   Socioeconomic History   Marital status: Married    Spouse name: Not on file   Number of children: 0   Years of education: hs   Highest education level: Not on file  Occupational History   Occupation: [employer]    Employer: [employer]  Tobacco Use   Smoking status: Former    Packs/day: 1.00    Years: 42.00    Additional pack years: 0.00    Total pack years: 42.00    Types: Cigarettes    Quit date: 03/23/2003    Years since quitting: 19.3   Smokeless tobacco: Never   Tobacco comments:    Former smoker 02/19/22  Vaping Use   Vaping Use: Never used  Substance and Sexual Activity   Alcohol use: No     Alcohol/week: 0.0 standard drinks of alcohol   Drug use: No   Sexual activity: Not on file  Other Topics Concern   Not on file  Social History Narrative   He and his partner George have now officially gotten married on 05/26/2013.  George is also a patient of our clinic.  He is now initially hyphenated his last name.   He has now " retired"from his long-term employment at [employer]. ->  As of September 2020   He does not smoke, he quit in 2009.  He does not drink alcohol.   He was adopted and no family history is known.    Social Determinants of Health   Financial Resource Strain: Not on file  Food Insecurity: Not on file  Transportation Needs: Not on file  Physical Activity: Not on file  Stress: Not on file  Social Connections: Not on file  Intimate Partner Violence: Not on file    ROS- All systems are reviewed and negative except as per the HPI above.  Physical Exam: Vitals:   07/10/22 1406  BP: 114/70  Pulse: 76  Weight: (!) 144.7 kg  Height: 6' 1" (1.854 m)    GEN- The patient is a well appearing male, alert and oriented x 3 today.   HEENT-head normocephalic, atraumatic, sclera clear, conjunctiva pink, hearing intact, trachea midline. Lungs- Clear to ausculation bilaterally, normal work of breathing Heart- irregular rate and rhythm, no murmurs, rubs or gallops  GI- soft, NT, ND, + BS Extremities- no clubbing, cyanosis, or edema MS- no significant deformity or atrophy Skin- no rash or lesion Psych- euthymic mood, full affect Neuro- strength and sensation are intact   Wt Readings from Last 3 Encounters:  07/10/22 (!) 144.7 kg  06/04/22 (!) 147.3 kg  05/16/22 (!) 144.7 kg    EKG today demonstrates  Afib Vent. rate 76 BPM PR interval * ms   QRS duration 108 ms QT/QTcB 400/450 ms   Echo 02/21/22 demonstrated   1. EF challenging to assess with afib/ challenging windows. Left  ventricular ejection fraction, by estimation, is 50 to 55%. The left   ventricle has low normal function. The left ventricle demonstrates global  hypokinesis. There is mild concentric left  ventricular hypertrophy. Left ventricular diastolic parameters are  indeterminate.   2. Right ventricular systolic function is normal. The right ventricular  size is normal.   3. Left atrial size was mildly dilated.   4. Trivial mitral valve regurgitation.   5. There is mild calcification of the aortic valve. Aortic valve  regurgitation is not visualized.   6. There is mild dilatation of the ascending aorta, measuring 36 mm.   7. The inferior vena cava is normal in size with greater than 50%  respiratory variability, suggesting right atrial pressure of 3 mmHg.   Epic records are reviewed at length today  CHA2DS2-VASc Score = 4  The patient's score is based upon: CHF History: 0 HTN History: 1 Diabetes History: 1 Stroke History: 0 Vascular Disease History: 1 Age Score: 1 Gender Score: 0       ASSESSMENT AND PLAN: 1. Persistent Atrial Fibrillation (ICD10:  I48.19) The patient's CHA2DS2-VASc score is 4, indicating a 4.8% annual risk of stroke.   Continue amiodarone 200 mg BID for a total of one month, then decrease to once daily. Will arrange for DCCV after amiodarone load.  Check cmet/cbc/TSH and CXR. Could potentially be an ablation candidate in the future if he continues to lose weight.  Continue carvedilol 12.5 mg BID Continue Eliquis 5 mg BID  2. Secondary Hypercoagulable State (ICD10:  D68.69) The patient is at significant risk for stroke/thromboembolism based upon his CHA2DS2-VASc Score of 4.  Continue Apixaban (Eliquis).   3. Obesity Body mass index is 42.09 kg/m. Lifestyle modification was discussed and encouraged including regular physical activity and weight reduction. On Mounjaro.   4. Obstructive sleep apnea Encouraged compliance with CPAP therapy.  5. CAD S/p several stents No anginal symptoms.  6. PAD Followed by Dr Arida.  7.  HTN Stable, no changes today.   Follow up in the AF clinic post DCCV.    Ricky Latika Kronick PA-C [facility] [address] [place], [place] [address] [phone] 07/10/2022 5:00 PM 

## 2022-07-11 ENCOUNTER — Other Ambulatory Visit (HOSPITAL_COMMUNITY): Payer: Self-pay | Admitting: Physician Assistant

## 2022-07-12 ENCOUNTER — Ambulatory Visit (HOSPITAL_COMMUNITY)
Admission: RE | Admit: 2022-07-12 | Discharge: 2022-07-12 | Disposition: A | Discharge status: Home / Self Care | Payer: Medicare Other | Source: Ambulatory Visit | Attending: Vascular Surgery | Admitting: Vascular Surgery

## 2022-07-12 ENCOUNTER — Ambulatory Visit: Payer: Medicare Other | Admitting: Physician Assistant

## 2022-07-12 VITALS — BP 123/78 | HR 70 | Temp 97.8°F | Resp 16 | Ht 73.0 in | Wt 317.0 lb

## 2022-07-12 DIAGNOSIS — I6523 Occlusion and stenosis of bilateral carotid arteries: Secondary | ICD-10-CM | POA: Diagnosis not present

## 2022-07-12 NOTE — Progress Notes (Signed)
Office Note   History of Present Illness   Harold [name] is a 67 y.o. (1955-05-25) male who presents for surveillance of carotid artery stenosis.  He has a history of right carotid endarterectomy in 2005.  He denies any prior history of TIA or stroke.  The patient returns today for follow up. He denies any recent CVA or TIA diagnosis. He also denies any recent strokelike symptoms such as slurred speech, facial droop, sudden visual changes, or sudden weakness/numbness.  He has a known history of PAD which is being followed by [name].  He has a history of right TP trunk and posterior tibial artery angioplasty in 2021 for severe claudication.  He states that he was recently diagnosed with A-fib.  He is on Eliquis and amiodarone.  He is scheduled for cardioversion in June.  Current Outpatient Medications  Medication Sig Dispense Refill   allopurinol (ZYLOPRIM) 100 MG tablet Take 200 mg by mouth 2 (two) times daily.     amiodarone (PACERONE) 200 MG tablet Take 1 tablet (200 mg total) by mouth daily. 90 tablet 1   apixaban (ELIQUIS) 5 MG TABS tablet Take 1 tablet (5 mg total) by mouth 2 (two) times daily. 28 tablet 0   B-D ULTRAFINE III SHORT PEN 31G X 8 MM MISC Inject 1 each into the skin 3 (three) times daily.     carvedilol (COREG) 12.5 MG tablet TAKE 1 TABLET (12.5MG  TOTAL) BY MOUTH TWICE A DAY WITH MEALS (Patient taking differently: Taking 6.25 mg by mouth twice daily) 180 tablet 3   COLCRYS 0.6 MG tablet Take 0.6 mg by mouth daily.     Continuous Blood Gluc Sensor (FREESTYLE LIBRE 14 DAY SENSOR) MISC APPLY 1 SENSOR EVERY 14 DAYS  11   DENTA 5000 PLUS 1.1 % CREA dental cream Take 1 Application by mouth daily.     DULoxetine (CYMBALTA) 60 MG capsule Take 60 mg by mouth daily.     ezetimibe (ZETIA) 10 MG tablet Take 10 mg by mouth daily.     FARXIGA 10 MG TABS tablet Take 10 mg by mouth daily.     furosemide (LASIX) 80 MG tablet Take 160 mg by mouth 2 (two) times daily.      gabapentin (NEURONTIN) 800 MG tablet Take 300 mg by mouth 3 (three) times daily.     insulin regular human CONCENTRATED (HUMULIN R U-500 KWIKPEN) 500 UNIT/ML kwikpen Inject 100-200 Units into the skin See admin instructions. 70 units in the morning and 150 units at night     levothyroxine (SYNTHROID, LEVOTHROID) 88 MCG tablet Take 88 mcg by mouth daily before breakfast.     losartan (COZAAR) 25 MG tablet Take 12.5 mg by mouth daily.     meclizine (ANTIVERT) 25 MG tablet Taking 37.5mg  by mouth twice daily and 25 mg at night     nitroGLYCERIN (NITROSTAT) 0.4 MG SL tablet PLACE 1 TAB UNDER THE TONGUE EVERY 5 MINUTES AS NEEDED FOR CHEST PAIN (SEVERE PRESSURE OR TIGHTNESS) 25 tablet 3   omeprazole (PRILOSEC) 20 MG capsule TAKE 1 CAPSULE BY MOUTH EVERY DAY 90 capsule 3   potassium chloride (MICRO-K) 10 MEQ CR capsule Take 10 mEq by mouth daily.   5   rosuvastatin (CRESTOR) 20 MG tablet Take 20 mg by mouth daily.     tirzepatide (MOUNJARO) 12.5 MG/0.5ML Pen Inject 12.5 mg into the skin once a week. 2 mL 2   tirzepatide (MOUNJARO) 12.5 MG/0.5ML Pen Inject 12.5 mg into the  skin once a week. 2 mL 11   tirzepatide (MOUNJARO) 15 MG/0.5ML Pen Inject 15 mg into the skin once a week. 6 mL 3   tirzepatide (MOUNJARO) 7.5 MG/0.5ML Pen Inject 7.5 mg into the skin once a week. 2 mL 2   TRESIBA FLEXTOUCH 200 UNIT/ML FlexTouch Pen Inject 50 Units into the skin daily.     mupirocin ointment (BACTROBAN) 2 % APPLY TO AFFECTED AREA TWICE A DAY (Patient not taking: Reported on 07/12/2022) 22 g 2   No current facility-administered medications for this visit.    REVIEW OF SYSTEMS (negative unless checked):   Cardiac:  []  Chest pain or chest pressure? []  Shortness of breath upon activity? []  Shortness of breath when lying flat? [x]  Irregular heart rhythm?  Vascular:  []  Pain in calf, thigh, or hip brought on by walking? []  Pain in feet at night that wakes you up from your sleep? []  Blood clot in your veins? []  Leg  swelling?  Pulmonary:  []  Oxygen at home? []  Productive cough? []  Wheezing?  Neurologic:  []  Sudden weakness in arms or legs? []  Sudden numbness in arms or legs? []  Sudden onset of difficult speaking or slurred speech? []  Temporary loss of vision in one eye? []  Problems with dizziness?  Gastrointestinal:  []  Blood in stool? []  Vomited blood?  Genitourinary:  []  Burning when urinating? []  Blood in urine?  Psychiatric:  []  Major depression  Hematologic:  []  Bleeding problems? []  Problems with blood clotting?  Dermatologic:  []  Rashes or ulcers?  Constitutional:  []  Fever or chills?  Ear/Nose/Throat:  []  Change in hearing? []  Nose bleeds? []  Sore throat?  Musculoskeletal:  []  Back pain? []  Joint pain? []  Muscle pain?   Physical Examination   Vitals:   07/12/22 1356  BP: 123/78  Pulse: 70  Resp: 16  Temp: 97.8 F (36.6 C)  TempSrc: Temporal  SpO2: 95%  Weight: (!) 317 lb (143.8 kg)  Height: 6\' 1"  (1.854 m)   Body mass index is 41.82 kg/m.  General:  WDWN in NAD; vital signs documented above Gait: Not observed HENT: WNL, normocephalic Pulmonary: normal non-labored breathing , without rales, rhonchi,  wheezing Cardiac: irregular Abdomen: soft, NT, no masses Skin: without rashes Vascular Exam/Pulses: palpable radial pulses bilaterally Extremities: BLE warm and well perfused Musculoskeletal: no muscle wasting or atrophy  Neurologic: A&O X 3;  No focal weakness or paresthesias are detected Psychiatric:  The pt has Normal affect.  Non-Invasive Vascular Imaging   Bilateral Carotid Duplex (07/12/2022):  R ICA stenosis:  1-39% R VA:  patent and antegrade L ICA stenosis:  40-59% L VA:  patent and antegrade   Medical Decision Making   Harold [name] is a 68 y.o. male who presents for surveillance of carotid artery stenosis  Based on the patient's vascular studies, his carotid stenosis unchanged bilaterally.  His right ICA stenosis is 1 to  39%.  His left ICA stenosis is 40 to 59% He denies any strokelike symptoms such as slurred speech, facial droop, sudden visual changes, or sudden weakness/numbness. No recent diagnosis of CVA or TIA. He has no neuro deficits on exam. There are palpable and equal radial pulses bilaterally He will follow-up with our office in 1 year with repeat carotid duplex   Loel Dubonnet PA-C [facility] Office: [phone]  Clinic MD: Karin Lieu

## 2022-07-15 NOTE — Pre-Procedure Instructions (Signed)
Patient takes [place].  These drugs have to be held before anesthesia case on 07/25/22.  Mounjaro-  last dose on or before Wednesday 5/29  Farixga- last dose on Sunday 07/21/22

## 2022-07-16 DIAGNOSIS — I83028 Varicose veins of left lower extremity with ulcer other part of lower leg: Secondary | ICD-10-CM | POA: Diagnosis not present

## 2022-07-16 DIAGNOSIS — I83029 Varicose veins of left lower extremity with ulcer of unspecified site: Secondary | ICD-10-CM | POA: Diagnosis not present

## 2022-07-16 DIAGNOSIS — E1165 Type 2 diabetes mellitus with hyperglycemia: Secondary | ICD-10-CM | POA: Diagnosis not present

## 2022-07-16 DIAGNOSIS — L97922 Non-pressure chronic ulcer of unspecified part of left lower leg with fat layer exposed: Secondary | ICD-10-CM | POA: Diagnosis not present

## 2022-07-16 DIAGNOSIS — L97822 Non-pressure chronic ulcer of other part of left lower leg with fat layer exposed: Secondary | ICD-10-CM | POA: Diagnosis not present

## 2022-07-16 DIAGNOSIS — I739 Peripheral vascular disease, unspecified: Secondary | ICD-10-CM | POA: Diagnosis not present

## 2022-07-16 DIAGNOSIS — E1151 Type 2 diabetes mellitus with diabetic peripheral angiopathy without gangrene: Secondary | ICD-10-CM | POA: Diagnosis not present

## 2022-07-16 DIAGNOSIS — E11622 Type 2 diabetes mellitus with other skin ulcer: Secondary | ICD-10-CM | POA: Diagnosis not present

## 2022-07-22 ENCOUNTER — Other Ambulatory Visit: Payer: Self-pay

## 2022-07-22 DIAGNOSIS — I6523 Occlusion and stenosis of bilateral carotid arteries: Secondary | ICD-10-CM

## 2022-07-23 DIAGNOSIS — E1165 Type 2 diabetes mellitus with hyperglycemia: Secondary | ICD-10-CM | POA: Diagnosis not present

## 2022-07-23 DIAGNOSIS — I890 Lymphedema, not elsewhere classified: Secondary | ICD-10-CM | POA: Diagnosis not present

## 2022-07-23 DIAGNOSIS — I83028 Varicose veins of left lower extremity with ulcer other part of lower leg: Secondary | ICD-10-CM | POA: Diagnosis not present

## 2022-07-23 DIAGNOSIS — L02416 Cutaneous abscess of left lower limb: Secondary | ICD-10-CM | POA: Diagnosis not present

## 2022-07-23 DIAGNOSIS — L97522 Non-pressure chronic ulcer of other part of left foot with fat layer exposed: Secondary | ICD-10-CM | POA: Diagnosis not present

## 2022-07-23 DIAGNOSIS — I83892 Varicose veins of left lower extremities with other complications: Secondary | ICD-10-CM | POA: Diagnosis not present

## 2022-07-23 DIAGNOSIS — I878 Other specified disorders of veins: Secondary | ICD-10-CM | POA: Diagnosis not present

## 2022-07-23 DIAGNOSIS — E11622 Type 2 diabetes mellitus with other skin ulcer: Secondary | ICD-10-CM | POA: Diagnosis not present

## 2022-07-23 DIAGNOSIS — L97822 Non-pressure chronic ulcer of other part of left lower leg with fat layer exposed: Secondary | ICD-10-CM | POA: Diagnosis not present

## 2022-07-24 NOTE — Pre-Procedure Instructions (Signed)
Left voicemail for patient regarding procedure (cardioversion) tomorrow - arrive at 1100, NPO after midnight, ensure ride home and responsible person to stay with pt for 24 hours after, continue taking Eliquis and don't take [place] or [employer]

## 2022-07-25 ENCOUNTER — Ambulatory Visit (HOSPITAL_BASED_OUTPATIENT_CLINIC_OR_DEPARTMENT_OTHER): Payer: Medicare Other | Admitting: Certified Registered Nurse Anesthetist

## 2022-07-25 ENCOUNTER — Encounter (HOSPITAL_COMMUNITY): Payer: Self-pay | Admitting: Cardiology

## 2022-07-25 ENCOUNTER — Ambulatory Visit (HOSPITAL_COMMUNITY): Payer: Medicare Other | Admitting: Certified Registered Nurse Anesthetist

## 2022-07-25 ENCOUNTER — Ambulatory Visit (HOSPITAL_COMMUNITY)
Admission: RE | Admit: 2022-07-25 | Discharge: 2022-07-25 | Disposition: A | Discharge status: Home / Self Care | Payer: Medicare Other | Attending: Cardiology | Admitting: Cardiology

## 2022-07-25 ENCOUNTER — Encounter (HOSPITAL_COMMUNITY)
Admission: RE | Disposition: A | Discharge status: Home / Self Care | Payer: Self-pay | Source: Home / Self Care | Attending: Cardiology

## 2022-07-25 DIAGNOSIS — E1122 Type 2 diabetes mellitus with diabetic chronic kidney disease: Secondary | ICD-10-CM | POA: Insufficient documentation

## 2022-07-25 DIAGNOSIS — I10 Essential (primary) hypertension: Secondary | ICD-10-CM | POA: Diagnosis not present

## 2022-07-25 DIAGNOSIS — E1151 Type 2 diabetes mellitus with diabetic peripheral angiopathy without gangrene: Secondary | ICD-10-CM | POA: Insufficient documentation

## 2022-07-25 DIAGNOSIS — Z7901 Long term (current) use of anticoagulants: Secondary | ICD-10-CM | POA: Insufficient documentation

## 2022-07-25 DIAGNOSIS — I2510 Atherosclerotic heart disease of native coronary artery without angina pectoris: Secondary | ICD-10-CM | POA: Insufficient documentation

## 2022-07-25 DIAGNOSIS — Z6841 Body Mass Index (BMI) 40.0 and over, adult: Secondary | ICD-10-CM | POA: Diagnosis not present

## 2022-07-25 DIAGNOSIS — I4891 Unspecified atrial fibrillation: Secondary | ICD-10-CM

## 2022-07-25 DIAGNOSIS — I4819 Other persistent atrial fibrillation: Secondary | ICD-10-CM | POA: Insufficient documentation

## 2022-07-25 DIAGNOSIS — G4733 Obstructive sleep apnea (adult) (pediatric): Secondary | ICD-10-CM | POA: Diagnosis not present

## 2022-07-25 DIAGNOSIS — Z87891 Personal history of nicotine dependence: Secondary | ICD-10-CM | POA: Diagnosis not present

## 2022-07-25 DIAGNOSIS — N182 Chronic kidney disease, stage 2 (mild): Secondary | ICD-10-CM | POA: Diagnosis not present

## 2022-07-25 DIAGNOSIS — Z79899 Other long term (current) drug therapy: Secondary | ICD-10-CM | POA: Insufficient documentation

## 2022-07-25 DIAGNOSIS — Z955 Presence of coronary angioplasty implant and graft: Secondary | ICD-10-CM | POA: Diagnosis not present

## 2022-07-25 DIAGNOSIS — E785 Hyperlipidemia, unspecified: Secondary | ICD-10-CM | POA: Diagnosis not present

## 2022-07-25 DIAGNOSIS — I129 Hypertensive chronic kidney disease with stage 1 through stage 4 chronic kidney disease, or unspecified chronic kidney disease: Secondary | ICD-10-CM | POA: Insufficient documentation

## 2022-07-25 DIAGNOSIS — E669 Obesity, unspecified: Secondary | ICD-10-CM | POA: Insufficient documentation

## 2022-07-25 HISTORY — PX: CARDIOVERSION: SHX1299

## 2022-07-25 LAB — GLUCOSE, CAPILLARY: Glucose-Capillary: 208 mg/dL — ABNORMAL HIGH (ref 70–99)

## 2022-07-25 SURGERY — CARDIOVERSION
Anesthesia: General

## 2022-07-25 MED ORDER — SODIUM CHLORIDE 0.9 % IV SOLN: INTRAVENOUS | Status: DC

## 2022-07-25 MED ORDER — PROPOFOL 10 MG/ML IV BOLUS
INTRAVENOUS | Status: DC | PRN
  Administered 2022-07-25: 70 mg via INTRAVENOUS

## 2022-07-25 MED ORDER — LIDOCAINE HCL (CARDIAC) PF 100 MG/5ML IV SOSY
PREFILLED_SYRINGE | INTRAVENOUS | Status: DC | PRN
  Administered 2022-07-25: 80 mg via INTRATRACHEAL

## 2022-07-25 SURGICAL SUPPLY — 1 items: ELECT DEFIB PAD ADLT CADENCE (PAD) ×1 IMPLANT

## 2022-07-25 NOTE — Anesthesia Preprocedure Evaluation (Signed)
Anesthesia Evaluation  Patient identified by MRN, date of birth, ID band Patient awake    Reviewed: Allergy & Precautions, H&P , NPO status , Patient's Chart, lab work & pertinent test results  History of Anesthesia Complications (+) PONV and history of anesthetic complications  Airway Mallampati: II   Neck ROM: full    Dental   Pulmonary sleep apnea , COPD, former smoker   breath sounds clear to auscultation       Cardiovascular hypertension, + CAD and + Peripheral Vascular Disease  + dysrhythmias Atrial Fibrillation  Rhythm:irregular Rate:Normal     Neuro/Psych  PSYCHIATRIC DISORDERS Anxiety Depression     Neuromuscular disease    GI/Hepatic ,GERD  ,,  Endo/Other  diabetes, Type 2Hypothyroidism  Morbid obesity  Renal/GU      Musculoskeletal   Abdominal   Peds  Hematology   Anesthesia Other Findings   Reproductive/Obstetrics                             Anesthesia Physical Anesthesia Plan  ASA: 3  Anesthesia Plan: General   Post-op Pain Management:    Induction: Intravenous  PONV Risk Score and Plan: 3 and Propofol infusion and Treatment may vary due to age or medical condition  Airway Management Planned: Mask  Additional Equipment:   Intra-op Plan:   Post-operative Plan:   Informed Consent: I have reviewed the patients History and Physical, chart, labs and discussed the procedure including the risks, benefits and alternatives for the proposed anesthesia with the patient or authorized representative who has indicated his/her understanding and acceptance.     Dental advisory given  Plan Discussed with: CRNA, Anesthesiologist and Surgeon  Anesthesia Plan Comments:        Anesthesia Quick Evaluation

## 2022-07-25 NOTE — Transfer of Care (Signed)
Immediate Anesthesia Transfer of Care Note  Patient: Harold [name]  Procedure(s) Performed: CARDIOVERSION  Patient Location: Cath Lab  Anesthesia Type:General  Level of Consciousness: drowsy and patient cooperative  Airway & Oxygen Therapy: Patient Spontanous Breathing and Patient connected to nasal cannula oxygen  Post-op Assessment: Report given to RN and Post -op Vital signs reviewed and stable  Post vital signs: Reviewed and stable  Last Vitals:  Vitals Value Taken Time  BP    Temp    Pulse    Resp    SpO2      Last Pain:  Vitals:   07/25/22 1118  TempSrc: Tympanic  PainSc: 0-No pain         Complications: No notable events documented.

## 2022-07-25 NOTE — CV Procedure (Signed)
    Electrical Cardioversion Procedure Note Harold [name] [id number] Jun 19, 1955  Procedure: Electrical Cardioversion Indications:  Atrial Fibrillation  Time Out: Verified patient identification, verified procedure,medications/allergies/relevent history reviewed, required imaging and test results available.  Performed  Procedure Details  The patient was NPO after midnight. Anesthesia was administered at the beside  by Dr.Hodierne with propofol.  Cardioversion was performed with synchronized biphasic defibrillation via AP pads with 200 joules.  1 attempt(s) were performed.  The patient converted to normal sinus rhythm. The patient tolerated the procedure well   IMPRESSION:  Successful cardioversion of atrial fibrillation    Donato Schultz 07/25/2022, 12:02 PM

## 2022-07-25 NOTE — Discharge Instructions (Signed)

## 2022-07-25 NOTE — Interval H&P Note (Signed)
History and Physical Interval Note:  07/25/2022 11:11 AM  Harold [name]  has presented today for surgery, with the diagnosis of AFIB.  The various methods of treatment have been discussed with the patient and family. After consideration of risks, benefits and other options for treatment, the patient has consented to  Procedure(s): CARDIOVERSION (N/A) as a surgical intervention.  The patient's history has been reviewed, patient examined, no change in status, stable for surgery.  I have reviewed the patient's chart and labs.  Questions were answered to the patient's satisfaction.     [employer]

## 2022-07-26 ENCOUNTER — Encounter (HOSPITAL_COMMUNITY): Payer: Self-pay | Admitting: Cardiology

## 2022-07-26 DIAGNOSIS — E1165 Type 2 diabetes mellitus with hyperglycemia: Secondary | ICD-10-CM | POA: Diagnosis not present

## 2022-07-26 DIAGNOSIS — I83028 Varicose veins of left lower extremity with ulcer other part of lower leg: Secondary | ICD-10-CM | POA: Diagnosis not present

## 2022-07-26 DIAGNOSIS — I890 Lymphedema, not elsewhere classified: Secondary | ICD-10-CM | POA: Diagnosis not present

## 2022-07-26 DIAGNOSIS — L02416 Cutaneous abscess of left lower limb: Secondary | ICD-10-CM | POA: Diagnosis not present

## 2022-07-26 DIAGNOSIS — I83892 Varicose veins of left lower extremities with other complications: Secondary | ICD-10-CM | POA: Diagnosis not present

## 2022-07-26 DIAGNOSIS — E11622 Type 2 diabetes mellitus with other skin ulcer: Secondary | ICD-10-CM | POA: Diagnosis not present

## 2022-07-26 DIAGNOSIS — L97822 Non-pressure chronic ulcer of other part of left lower leg with fat layer exposed: Secondary | ICD-10-CM | POA: Diagnosis not present

## 2022-07-29 NOTE — Anesthesia Postprocedure Evaluation (Signed)
Anesthesia Post Note  Patient: Harold [name]  Procedure([name]) Performed: CARDIOVERSION     Patient location during evaluation: Cath Lab Anesthesia Type: General Level of consciousness: awake and alert Pain management: pain level controlled Vital Signs Assessment: post-procedure vital signs reviewed and stable Respiratory status: spontaneous breathing, nonlabored ventilation, respiratory function stable and patient connected to nasal cannula oxygen Cardiovascular status: blood pressure returned to baseline and stable Postop Assessment: no apparent nausea or vomiting Anesthetic complications: no   No notable events documented.  Last Vitals:  Vitals:   07/25/22 1215 07/25/22 1225  BP: 138/72 (!) 144/64  Pulse: 75 79  Resp: 19 17  Temp:    SpO2: 98% 96%    Last Pain:  Vitals:   07/25/22 1225  TempSrc:   PainSc: 0-No pain                 Harold [name]

## 2022-07-30 DIAGNOSIS — L97922 Non-pressure chronic ulcer of unspecified part of left lower leg with fat layer exposed: Secondary | ICD-10-CM | POA: Diagnosis not present

## 2022-07-30 DIAGNOSIS — E1165 Type 2 diabetes mellitus with hyperglycemia: Secondary | ICD-10-CM | POA: Diagnosis not present

## 2022-07-30 DIAGNOSIS — E11622 Type 2 diabetes mellitus with other skin ulcer: Secondary | ICD-10-CM | POA: Diagnosis not present

## 2022-07-30 DIAGNOSIS — I83028 Varicose veins of left lower extremity with ulcer other part of lower leg: Secondary | ICD-10-CM | POA: Diagnosis not present

## 2022-07-30 DIAGNOSIS — L97822 Non-pressure chronic ulcer of other part of left lower leg with fat layer exposed: Secondary | ICD-10-CM | POA: Diagnosis not present

## 2022-07-30 DIAGNOSIS — I83892 Varicose veins of left lower extremities with other complications: Secondary | ICD-10-CM | POA: Diagnosis not present

## 2022-07-30 DIAGNOSIS — I890 Lymphedema, not elsewhere classified: Secondary | ICD-10-CM | POA: Diagnosis not present

## 2022-07-30 DIAGNOSIS — L02416 Cutaneous abscess of left lower limb: Secondary | ICD-10-CM | POA: Diagnosis not present

## 2022-08-01 DIAGNOSIS — E11622 Type 2 diabetes mellitus with other skin ulcer: Secondary | ICD-10-CM | POA: Diagnosis not present

## 2022-08-01 DIAGNOSIS — E1165 Type 2 diabetes mellitus with hyperglycemia: Secondary | ICD-10-CM | POA: Diagnosis not present

## 2022-08-01 DIAGNOSIS — I482 Chronic atrial fibrillation, unspecified: Secondary | ICD-10-CM | POA: Diagnosis not present

## 2022-08-01 DIAGNOSIS — L97909 Non-pressure chronic ulcer of unspecified part of unspecified lower leg with unspecified severity: Secondary | ICD-10-CM | POA: Diagnosis not present

## 2022-08-01 DIAGNOSIS — E785 Hyperlipidemia, unspecified: Secondary | ICD-10-CM | POA: Diagnosis not present

## 2022-08-01 DIAGNOSIS — Z794 Long term (current) use of insulin: Secondary | ICD-10-CM | POA: Diagnosis not present

## 2022-08-01 DIAGNOSIS — I10 Essential (primary) hypertension: Secondary | ICD-10-CM | POA: Diagnosis not present

## 2022-08-01 DIAGNOSIS — I5032 Chronic diastolic (congestive) heart failure: Secondary | ICD-10-CM | POA: Diagnosis not present

## 2022-08-01 DIAGNOSIS — D696 Thrombocytopenia, unspecified: Secondary | ICD-10-CM | POA: Diagnosis not present

## 2022-08-01 DIAGNOSIS — E039 Hypothyroidism, unspecified: Secondary | ICD-10-CM | POA: Diagnosis not present

## 2022-08-05 ENCOUNTER — Encounter (HOSPITAL_COMMUNITY): Payer: Self-pay | Admitting: Physician Assistant

## 2022-08-05 ENCOUNTER — Ambulatory Visit (HOSPITAL_COMMUNITY)
Admission: RE | Admit: 2022-08-05 | Discharge: 2022-08-05 | Disposition: A | Discharge status: Home / Self Care | Payer: Medicare Other | Source: Ambulatory Visit | Attending: Physician Assistant | Admitting: Physician Assistant

## 2022-08-05 VITALS — BP 126/82 | HR 62 | Ht 73.0 in | Wt 335.8 lb

## 2022-08-05 DIAGNOSIS — Z6841 Body Mass Index (BMI) 40.0 and over, adult: Secondary | ICD-10-CM | POA: Insufficient documentation

## 2022-08-05 DIAGNOSIS — E669 Obesity, unspecified: Secondary | ICD-10-CM | POA: Insufficient documentation

## 2022-08-05 DIAGNOSIS — Z7901 Long term (current) use of anticoagulants: Secondary | ICD-10-CM | POA: Insufficient documentation

## 2022-08-05 DIAGNOSIS — E1165 Type 2 diabetes mellitus with hyperglycemia: Secondary | ICD-10-CM | POA: Diagnosis not present

## 2022-08-05 DIAGNOSIS — G4733 Obstructive sleep apnea (adult) (pediatric): Secondary | ICD-10-CM | POA: Insufficient documentation

## 2022-08-05 DIAGNOSIS — I2510 Atherosclerotic heart disease of native coronary artery without angina pectoris: Secondary | ICD-10-CM | POA: Diagnosis not present

## 2022-08-05 DIAGNOSIS — I129 Hypertensive chronic kidney disease with stage 1 through stage 4 chronic kidney disease, or unspecified chronic kidney disease: Secondary | ICD-10-CM | POA: Diagnosis not present

## 2022-08-05 DIAGNOSIS — Z79899 Other long term (current) drug therapy: Secondary | ICD-10-CM | POA: Insufficient documentation

## 2022-08-05 DIAGNOSIS — Z5181 Encounter for therapeutic drug level monitoring: Secondary | ICD-10-CM | POA: Diagnosis not present

## 2022-08-05 DIAGNOSIS — D6869 Other thrombophilia: Secondary | ICD-10-CM | POA: Diagnosis not present

## 2022-08-05 DIAGNOSIS — N189 Chronic kidney disease, unspecified: Secondary | ICD-10-CM | POA: Insufficient documentation

## 2022-08-05 DIAGNOSIS — I4819 Other persistent atrial fibrillation: Secondary | ICD-10-CM | POA: Insufficient documentation

## 2022-08-05 DIAGNOSIS — E785 Hyperlipidemia, unspecified: Secondary | ICD-10-CM | POA: Insufficient documentation

## 2022-08-05 DIAGNOSIS — E1140 Type 2 diabetes mellitus with diabetic neuropathy, unspecified: Secondary | ICD-10-CM | POA: Diagnosis not present

## 2022-08-05 NOTE — Progress Notes (Signed)
Primary Care Physician: [name], MD Primary Cardiologist: [name], MD Electrophysiologist: [name], MD  Referring Physician: Dr Ewell Poe is a 67 y.o. male with a history of carotid disease, obstructive sleep apnea on CPAP, peripheral arterial disease, essential hypertension, obesity, chronic kidney disease, type 2 diabetes, CAD, hyperlipidemia, atrial fibrillation who presents for follow up in the [facility]. The patient was initially diagnosed with atrial fibrillation 01/28/22 at his visit with Dr Herbie Baltimore. Patient had noticed more fatigue with exertion for the previous two months but was otherwise unaware of his arrhythmia. He was started on Eliquis for a CHADS2VASC score of 4. An echo and cardiac monitor were also ordered. He denies significant alcohol use and is compliant with his CPAP. The cardiac monitor showed 100% afib burden.   Patient is s/p DCCV on 03/13/22. Patient reports that after his DCCV he felt less SOB. He was back out of rhythm on follow up 03/27/22. Seen by Dr Nelly Laurence to discuss rhythm control options who recommended medical therapy. He was able to wean off Abilify and started on amiodarone 06/24/22. He is s/p repeat DCCV on 07/25/22.  On follow up today, patient remains in SR and feels well from a cardiac standpoint. His primary concern is his fall risk/balance issues and his weight gain off Mounjaro.   Today, he denies symptoms of palpitations, chest pain, shortness of breath, orthopnea, PND, lower extremity edema, dizziness, presyncope, syncope, snoring, daytime somnolence, bleeding, or neurologic sequela. The patient is tolerating medications without difficulties and is otherwise without complaint today.   ROS- All systems are reviewed and negative except as per the HPI above.   Atrial Fibrillation Risk Factors:  he does have symptoms or diagnosis of sleep apnea. he is compliant with CPAP therapy. he  does not have a history of rheumatic fever. he does not have a history of alcohol use.   he has a BMI of Body mass index is 44.3 kg/m.Marland Kitchen Filed Weights   08/05/22 1430  Weight: (!) 152.3 kg     Atrial Fibrillation Management history:  Previous antiarrhythmic drugs: amiodarone Previous cardioversions: 03/13/22 Previous ablations: none Anticoagulation history: Eliquis   Physical Exam: BP 126/82   Pulse 62   Ht 6\' 1"  (1.854 m)   Wt (!) 152.3 kg   BMI 44.30 kg/m   GEN: Well nourished, well developed in no acute distress NECK: No JVD; No carotid bruits CARDIAC: Regular rate and rhythm, no murmurs, rubs, gallops RESPIRATORY:  Clear to auscultation without rales, wheezing or rhonchi  ABDOMEN: Soft, non-tender, non-distended EXTREMITIES:  No edema; No deformity   EKG today demonstrates  SR, 1st degree AV block, NST Vent. rate 62 BPM PR interval 206 ms QRS duration 108 ms QT/QTcB 440/446 ms  Echo 02/21/22 demonstrated   1. EF challenging to assess with afib/ challenging windows. Left  ventricular ejection fraction, by estimation, is 50 to 55%. The left  ventricle has low normal function. The left ventricle demonstrates global  hypokinesis. There is mild concentric left ventricular hypertrophy. Left ventricular diastolic parameters are indeterminate.   2. Right ventricular systolic function is normal. The right ventricular  size is normal.   3. Left atrial size was mildly dilated.   4. Trivial mitral valve regurgitation.   5. There is mild calcification of the aortic valve. Aortic valve  regurgitation is not visualized.   6. There is mild dilatation of the ascending aorta, measuring 36 mm.   7. The  inferior vena cava is normal in size with greater than 50%  respiratory variability, suggesting right atrial pressure of 3 mmHg.   CHA2DS2-VASc Score = 4  The patient's score is based upon: CHF History: 0 HTN History: 1 Diabetes History: 1 Stroke History: 0 Vascular Disease  History: 1 Age Score: 1 Gender Score: 0       ASSESSMENT AND PLAN: Persistent Atrial Fibrillation (ICD10:  I48.19) The patient's CHA2DS2-VASc score is 4, indicating a 4.8% annual risk of stroke.   S/p DCCV 07/25/22 Patient appears to be maintaining SR. Continue amiodarone 200 mg daily Continue carvedilol 12.5 mg BID Continue Eliquis 5 mg BID  Secondary Hypercoagulable State (ICD10:  D68.69) The patient is at significant risk for stroke/thromboembolism based upon his CHA2DS2-VASc Score of 4.  Continue Apixaban (Eliquis). Patient is concerned about his bleeding risk. He has fallen 10 times and is very worried about sustaining a head injury on anticoagulation. He is interested in [facility], will refer. Brochure given.   Obesity Body mass index is 44.3 kg/m.  Encouraged lifestyle modification Patient prescribed Mounjaro but is on a wait list to get the mediation. He will reach out to his endocrinologist for possible alternatives.   OSA  Encouraged nightly CPAP The importance of adequate treatment of sleep apnea was discussed today in order to improve our ability to maintain sinus rhythm long term  HTN Stable on current regimen  CAD S/p several stents No anginal symptoms.  PAD Followed by Dr Kirke Corin    Follow up in the AF clinic in 3 months.    Jorja Loa PA-C [facility] [address] [place], [place] [address] [phone]

## 2022-08-06 ENCOUNTER — Other Ambulatory Visit (HOSPITAL_COMMUNITY): Payer: Self-pay

## 2022-08-06 ENCOUNTER — Ambulatory Visit (HOSPITAL_COMMUNITY): Payer: Medicare Other | Admitting: Physician Assistant

## 2022-08-06 DIAGNOSIS — I83028 Varicose veins of left lower extremity with ulcer other part of lower leg: Secondary | ICD-10-CM | POA: Diagnosis not present

## 2022-08-06 DIAGNOSIS — E1165 Type 2 diabetes mellitus with hyperglycemia: Secondary | ICD-10-CM | POA: Diagnosis not present

## 2022-08-06 DIAGNOSIS — L02416 Cutaneous abscess of left lower limb: Secondary | ICD-10-CM | POA: Diagnosis not present

## 2022-08-06 DIAGNOSIS — I83892 Varicose veins of left lower extremities with other complications: Secondary | ICD-10-CM | POA: Diagnosis not present

## 2022-08-06 DIAGNOSIS — L97922 Non-pressure chronic ulcer of unspecified part of left lower leg with fat layer exposed: Secondary | ICD-10-CM | POA: Diagnosis not present

## 2022-08-06 DIAGNOSIS — I890 Lymphedema, not elsewhere classified: Secondary | ICD-10-CM | POA: Diagnosis not present

## 2022-08-06 DIAGNOSIS — L97822 Non-pressure chronic ulcer of other part of left lower leg with fat layer exposed: Secondary | ICD-10-CM | POA: Diagnosis not present

## 2022-08-06 DIAGNOSIS — E11622 Type 2 diabetes mellitus with other skin ulcer: Secondary | ICD-10-CM | POA: Diagnosis not present

## 2022-08-06 MED ORDER — MOUNJARO 12.5 MG/0.5ML ~~LOC~~ SOAJ
12.5000 mg | SUBCUTANEOUS | 5 refills | Status: DC
  Filled 2022-08-06: qty 2, 28d supply, fill #0

## 2022-08-07 DIAGNOSIS — Z79899 Other long term (current) drug therapy: Secondary | ICD-10-CM | POA: Diagnosis not present

## 2022-08-07 DIAGNOSIS — R2689 Other abnormalities of gait and mobility: Secondary | ICD-10-CM | POA: Diagnosis not present

## 2022-08-07 DIAGNOSIS — H539 Unspecified visual disturbance: Secondary | ICD-10-CM | POA: Diagnosis not present

## 2022-08-07 DIAGNOSIS — Z9181 History of falling: Secondary | ICD-10-CM | POA: Diagnosis not present

## 2022-08-09 DIAGNOSIS — M1712 Unilateral primary osteoarthritis, left knee: Secondary | ICD-10-CM | POA: Diagnosis not present

## 2022-08-09 DIAGNOSIS — M1711 Unilateral primary osteoarthritis, right knee: Secondary | ICD-10-CM | POA: Diagnosis not present

## 2022-08-09 DIAGNOSIS — M25551 Pain in right hip: Secondary | ICD-10-CM | POA: Diagnosis not present

## 2022-08-11 DIAGNOSIS — M25551 Pain in right hip: Secondary | ICD-10-CM | POA: Diagnosis not present

## 2022-08-13 DIAGNOSIS — M1712 Unilateral primary osteoarthritis, left knee: Secondary | ICD-10-CM | POA: Diagnosis not present

## 2022-08-13 DIAGNOSIS — M1611 Unilateral primary osteoarthritis, right hip: Secondary | ICD-10-CM | POA: Diagnosis not present

## 2022-08-15 DIAGNOSIS — L97822 Non-pressure chronic ulcer of other part of left lower leg with fat layer exposed: Secondary | ICD-10-CM | POA: Diagnosis not present

## 2022-08-15 DIAGNOSIS — I890 Lymphedema, not elsewhere classified: Secondary | ICD-10-CM | POA: Diagnosis not present

## 2022-08-15 DIAGNOSIS — I83892 Varicose veins of left lower extremities with other complications: Secondary | ICD-10-CM | POA: Diagnosis not present

## 2022-08-15 DIAGNOSIS — L97922 Non-pressure chronic ulcer of unspecified part of left lower leg with fat layer exposed: Secondary | ICD-10-CM | POA: Diagnosis not present

## 2022-08-15 DIAGNOSIS — E11622 Type 2 diabetes mellitus with other skin ulcer: Secondary | ICD-10-CM | POA: Diagnosis not present

## 2022-08-15 DIAGNOSIS — I83028 Varicose veins of left lower extremity with ulcer other part of lower leg: Secondary | ICD-10-CM | POA: Diagnosis not present

## 2022-08-15 DIAGNOSIS — E1165 Type 2 diabetes mellitus with hyperglycemia: Secondary | ICD-10-CM | POA: Diagnosis not present

## 2022-08-15 DIAGNOSIS — L02416 Cutaneous abscess of left lower limb: Secondary | ICD-10-CM | POA: Diagnosis not present

## 2022-08-21 DIAGNOSIS — M25551 Pain in right hip: Secondary | ICD-10-CM | POA: Diagnosis not present

## 2022-08-29 DIAGNOSIS — Z794 Long term (current) use of insulin: Secondary | ICD-10-CM | POA: Diagnosis not present

## 2022-08-29 DIAGNOSIS — I890 Lymphedema, not elsewhere classified: Secondary | ICD-10-CM | POA: Diagnosis not present

## 2022-08-29 DIAGNOSIS — Z7985 Long-term (current) use of injectable non-insulin antidiabetic drugs: Secondary | ICD-10-CM | POA: Diagnosis not present

## 2022-08-29 DIAGNOSIS — I83892 Varicose veins of left lower extremities with other complications: Secondary | ICD-10-CM | POA: Diagnosis not present

## 2022-08-29 DIAGNOSIS — E1165 Type 2 diabetes mellitus with hyperglycemia: Secondary | ICD-10-CM | POA: Diagnosis not present

## 2022-08-29 DIAGNOSIS — I83028 Varicose veins of left lower extremity with ulcer other part of lower leg: Secondary | ICD-10-CM | POA: Diagnosis not present

## 2022-08-29 DIAGNOSIS — L97821 Non-pressure chronic ulcer of other part of left lower leg limited to breakdown of skin: Secondary | ICD-10-CM | POA: Diagnosis not present

## 2022-08-29 DIAGNOSIS — L97822 Non-pressure chronic ulcer of other part of left lower leg with fat layer exposed: Secondary | ICD-10-CM | POA: Diagnosis not present

## 2022-08-29 DIAGNOSIS — L97922 Non-pressure chronic ulcer of unspecified part of left lower leg with fat layer exposed: Secondary | ICD-10-CM | POA: Diagnosis not present

## 2022-08-30 DIAGNOSIS — Z961 Presence of intraocular lens: Secondary | ICD-10-CM | POA: Diagnosis not present

## 2022-08-30 DIAGNOSIS — H524 Presbyopia: Secondary | ICD-10-CM | POA: Diagnosis not present

## 2022-08-30 DIAGNOSIS — E119 Type 2 diabetes mellitus without complications: Secondary | ICD-10-CM | POA: Diagnosis not present

## 2022-09-03 DIAGNOSIS — M1611 Unilateral primary osteoarthritis, right hip: Secondary | ICD-10-CM | POA: Diagnosis not present

## 2022-09-03 DIAGNOSIS — M170 Bilateral primary osteoarthritis of knee: Secondary | ICD-10-CM | POA: Diagnosis not present

## 2022-09-04 ENCOUNTER — Institutional Professional Consult (permissible substitution): Payer: Medicare Other | Admitting: Cardiology

## 2022-09-04 DIAGNOSIS — E1165 Type 2 diabetes mellitus with hyperglycemia: Secondary | ICD-10-CM | POA: Diagnosis not present

## 2022-09-04 DIAGNOSIS — E1140 Type 2 diabetes mellitus with diabetic neuropathy, unspecified: Secondary | ICD-10-CM | POA: Diagnosis not present

## 2022-09-04 DIAGNOSIS — S81802A Unspecified open wound, left lower leg, initial encounter: Secondary | ICD-10-CM | POA: Diagnosis not present

## 2022-09-10 ENCOUNTER — Ambulatory Visit: Payer: Medicare Other | Admitting: Podiatry

## 2022-09-10 DIAGNOSIS — L97522 Non-pressure chronic ulcer of other part of left foot with fat layer exposed: Secondary | ICD-10-CM

## 2022-09-10 DIAGNOSIS — I739 Peripheral vascular disease, unspecified: Secondary | ICD-10-CM

## 2022-09-10 DIAGNOSIS — E1149 Type 2 diabetes mellitus with other diabetic neurological complication: Secondary | ICD-10-CM | POA: Diagnosis not present

## 2022-09-10 DIAGNOSIS — M79675 Pain in left toe(s): Secondary | ICD-10-CM | POA: Diagnosis not present

## 2022-09-10 DIAGNOSIS — M79674 Pain in right toe(s): Secondary | ICD-10-CM | POA: Diagnosis not present

## 2022-09-10 DIAGNOSIS — L84 Corns and callosities: Secondary | ICD-10-CM | POA: Diagnosis not present

## 2022-09-10 DIAGNOSIS — B351 Tinea unguium: Secondary | ICD-10-CM

## 2022-09-10 MED ORDER — MUPIROCIN 2 % EX OINT: 1.0000 | TOPICAL_OINTMENT | Freq: Two times a day (BID) | CUTANEOUS | 2 refills | Status: DC

## 2022-09-10 NOTE — Progress Notes (Signed)
  Subjective:  Patient ID: Harold [name], male    DOB: 04/26/1955,  MRN: [id number]  Chief Complaint  Patient presents with   Nail Problem    Pt is here for Dfc he also states that his left great toe has started to curve left causing the nail to cut the second toe     67 y.o. male returns with the above complaint. History confirmed with patient.  Nails and calluses are thickened again.  He notes that previous debridement of both issues have been helpful.  It was a new issue of ulcer developing on the left second medial toe from the nail rubbing into the skin from hallux  Objective:  Physical Exam: reduced sensation at plantar pulps of toes and feet, large spider veins, varicosities and torturous veins in the lower legs along with venous stasis dermatitis noted and +1 PT and DP pulses bilaterally, both feet are warm and well-perfused.  Dry skin and fissuring plantar heel bilateral.  No ulcerations Left Foot: Moderate to severe hallux valgus with semirigid hammertoes present, mycotic nails x5,   Callus on medial hallux.  Full-thickness ulceration with exposed subcutaneous tissue on the left medial second toe, bleeding present, no erythema, edema cellulitis or signs of infection, measures approximate 5 mm x 2 mm Right Foot: Mild to moderate hallux valgus with semirigid hammertoes present, mycotic nails since 5.  There is a callus on the medial hallux. right dorsal fifth PIPJ ulcer has healed with overlying hyperkeratosis.       Assessment:   1. Skin ulcer of toe of left foot with fat layer exposed (HCC)   2. Diabetes mellitus type 2 with neurological manifestations (HCC)   3. PAD (peripheral artery disease) (HCC)   4. Corns and callus   5. Pain due to onychomycosis of toenails of both feet        Plan:  Patient was evaluated and treated and all questions answered.  He has new ulceration on the left medial second toe today, the nail was debrided and a silicone cap applied over  the hallux to offload pressure and prevent recurrence of this.  I recommend he cleanse and dress this daily with mupirocin ointment and a bandage, Rx for this was sent to his pharmacy.  I will see him back in 1 month to reevaluate the ulcer to ensure positive healing  All symptomatic hyperkeratoses were safely debrided with a sterile #15 blade to patient's level of comfort without incident. We discussed preventative and palliative care of these lesions including supportive and accommodative shoegear, padding, prefabricated and custom molded accommodative orthoses, use of a pumice stone and lotions/creams daily.  Discussed the etiology and treatment options for the condition in detail with the patient.  Routine regular debridement has been helpful. Recommended debridement of the nails today. Sharp and mechanical debridement performed of all painful and mycotic nails today. Nails debrided in length and thickness using a nail nipper to level of comfort. Discussed treatment options including appropriate shoe gear. Follow up as needed for painful nails.     Return in about 3 months (around 12/11/2022) for at risk diabetic foot care.

## 2022-09-12 DIAGNOSIS — I83892 Varicose veins of left lower extremities with other complications: Secondary | ICD-10-CM | POA: Diagnosis not present

## 2022-09-12 DIAGNOSIS — I890 Lymphedema, not elsewhere classified: Secondary | ICD-10-CM | POA: Diagnosis not present

## 2022-09-12 DIAGNOSIS — Z794 Long term (current) use of insulin: Secondary | ICD-10-CM | POA: Diagnosis not present

## 2022-09-12 DIAGNOSIS — D696 Thrombocytopenia, unspecified: Secondary | ICD-10-CM | POA: Diagnosis not present

## 2022-09-12 DIAGNOSIS — E1165 Type 2 diabetes mellitus with hyperglycemia: Secondary | ICD-10-CM | POA: Diagnosis not present

## 2022-09-12 DIAGNOSIS — L97821 Non-pressure chronic ulcer of other part of left lower leg limited to breakdown of skin: Secondary | ICD-10-CM | POA: Diagnosis not present

## 2022-09-12 DIAGNOSIS — L97822 Non-pressure chronic ulcer of other part of left lower leg with fat layer exposed: Secondary | ICD-10-CM | POA: Diagnosis not present

## 2022-09-12 DIAGNOSIS — L97922 Non-pressure chronic ulcer of unspecified part of left lower leg with fat layer exposed: Secondary | ICD-10-CM | POA: Diagnosis not present

## 2022-09-12 DIAGNOSIS — Z7985 Long-term (current) use of injectable non-insulin antidiabetic drugs: Secondary | ICD-10-CM | POA: Diagnosis not present

## 2022-09-12 DIAGNOSIS — I83028 Varicose veins of left lower extremity with ulcer other part of lower leg: Secondary | ICD-10-CM | POA: Diagnosis not present

## 2022-09-16 DIAGNOSIS — Z86010 Personal history of colonic polyps: Secondary | ICD-10-CM | POA: Diagnosis not present

## 2022-09-16 DIAGNOSIS — I4891 Unspecified atrial fibrillation: Secondary | ICD-10-CM | POA: Diagnosis not present

## 2022-09-18 DIAGNOSIS — G4733 Obstructive sleep apnea (adult) (pediatric): Secondary | ICD-10-CM | POA: Diagnosis not present

## 2022-09-19 DIAGNOSIS — R55 Syncope and collapse: Secondary | ICD-10-CM | POA: Diagnosis not present

## 2022-09-19 DIAGNOSIS — I951 Orthostatic hypotension: Secondary | ICD-10-CM | POA: Diagnosis not present

## 2022-09-23 NOTE — Progress Notes (Signed)
Cardiology Office Note:    Date:  09/24/2022   ID:  Harold [name], DOB 1955-10-19, MRN [id number]  PCP:  [name], MD  Cardiologist:  [name], MD  Electrophysiologist:  [name], MD   Referring MD: [name], MD   Chief Complaint: syncope   History of Present Illness:    Harold [name] is a 67 y.o. male with a history of  severe multivessel CAD s/p remote and angioplasty in 2003 and then DES to LAD and DES x2 to LCX followed by stage PCI with DES x3 to RCA in 04/2012, chronic diastolic CHF, persistent atrial fibrillation on Eliquis, PAD with bilateral lower extremity disease s/p balloon angioplasty of the right TP trunk into the posterior tibial artery followed by drug-coated balloon angioplasty of the TP trunk.in 09/2019, carotid artery disease s/p right CEA in 2005,  restrictive lung disease with pulmonary hypertension, obstructive sleep on CPAP, severe chronic venous insufficiency, hypertension, hyperlipidemia, type 2 diabetes mellitus, CKD stage III, and morbid obesity who is followed by Dr. Herbie Baltimore (primary Cardiologist), [name] (PAD), and Dr. Nelly Laurence (EP) and presents today for further evaluation of recurrent syncope.  Patient has a complex medical history as states above. She has a history of CAD and PAD with prior cardiac and peripheral interventions. She underwent balloon angioplasty of LCX in 12/2001 and then DES to LAD and DES x2 to proximal to mid LCX followed by staged PCI with DEX x3 to RCA in 04/2012 (patient declined CABG at that time). Repeat cardiac catheterization in 04/2013 showed stable disease. Continue medical therapy was recommend at that time. Most recent ischemic evaluation was a Myoview in 01/2022 which showed finding consistent with prior infarction but no reversible ischemia. EF was mildly reduced at 36%. However, Echo in 02/2022 and showed LVEF of 50-55% with global hypokinesis and mild LVH, normal RV size and function, and mild  dilatation of the ascending aorta measuring 36 mm.  In regards to PAD, he has both known carotid and lower extremity disease. He underwent right CEA in 2005. Most recent dopplers in 06/2022 showed 40-59% stenosis of left ICA and 1-39% stenosis of right ICA as well as bilateral systolic deceleration noted in bilateral vertebral arteries. ABIs in 09/2019 were suggestive of moderate disease on the right (0.72) and mild disease on the left (0.90). [name] performed an peripheral angiogram in 09/2019 which showed mild non-obstructive iliac disease, significant calcified stenosis in the TP trunk on the right with an occluded proximal anterior tibial artery and significant disease in the proximal tibial artery. He underwent successful balloon angioplasty of the right TP trunk into the posterior tibial artery followed by drug-coated balloon angioplasty of the TP trunk. Of note, left lower extremity arterial angiography was not performed at that time due to CKD. Most recent ABIs in 02/2021 were suggestive of mild disease on the left (0.91) but were normal on the right (1.23). Left lower extremity arterial ultrasound at that time showed 30-49% stenosis of SFA and occluded mid ATA (with distal reconstitution vs collateral flow at the ankle) as well as an occluded mid to distal peroneal artery.  More recently he has had issues with atrial fibrillation. He was initially diagnosed with this in 01/2022 during an office visit with Dr. Herbie Baltimore. His Plavix was stopped and he was started on Eliquis. Echo and Myoview were ordered for further evaluation. Myoview showed finding consistent with prior infarction but no reversible ischemia. EF was mildly reduced at 36%. However, Echo  showed LVEF of 50-55% with global hypokinesis and mild LVH, normal RV size and function, and mild dilatation of the ascending aorta measuring 36 mm. Zio monitor was also ordered to assess atrial fibrillation burden and showed continuous atrial fibrillation  with 2 short runs of NSVT. Triggered symptoms coincided with atrial fibrillation with RVR and PVCs. He was referred to the [facility] and underwent DCCV on 03/13/2022 (required 2 shocks for restoration of sinus rhythm). Unfortunately, he was back in atrial fibrillation at follow-up visit in 03/2022. He was seen by Dr. Nelly Laurence who to discuss different antiarrhythmic options. He was able to weaned off Abilify and started on Amiodarone in 06/2022. He underwent repeat DCCV on 07/25/2022 after Amiodarone load and successful converted to sinus rhythm. He was last seen in the [facility] on 08/05/2022 at which time he was maintaining sinus rhythm.   Patient presents today for follow-up. He states he has had 8 episodes of syncope over the past 2 months. During every epsides, he has been walking from one room to another and will sit. After sitting down, he will get dizzy with associated tunnel vision and feel like blood is leaving his body. He will then pass out. He states he is only unconscious for 3-5 seconds. His husband has witnessed these events and denies any seizure like activity. No tongue biting or bowel/ bladder activity. Last episode was last week. He denies any dizziness or issues when driving. He is Meclizine and had been trying to wean off of this. He decreased his dose from three times daily to twice daily and felt like his dizziness got worse with this so he increased the dose back to three times daily which seemed to help. He saw his PCP (Dr. Renne Crigler) for this last week. I am unable to see this office note but it sounds like he was orthostatic. Patient states his BP dropped 60 points from laying to standing. He did get a little dizzy with this. Dr. Renne Crigler decreased his Lasix from 160mg  twice daily to 80mg  twice daily. Patient states he has still been having the dizzy spell but they have been less severe. Dr. Wendy Poet also ordered a 2 week monitor which he is currently wearing. While in the office, patient did  develop a similar episode of dizziness and feeling like blood was leaving his body. I laid him down immediately and symptoms resolved.   Orthostatic were again positive in the office today: - Supine:  BP 155/82, HR 62 - Sitting:  BP 98/65, HR 64 - Standing:  BP 94/57, HR 63 - Standing (3 mins): BP 127/78, HR 78  He is otherwise doing okay from a cardiac standpoint. No chest pain, new or worsening shortness of breath, orthopnea, PND, worsening edema, or palpitations.   EKGs/Labs/Other Studies Reviewed:    The following studies were reviewed:  ABIs 03/13/2021: Summary:  Right: Resting right ankle-brachial index is within normal range. No  evidence of significant right lower extremity arterial disease. The right  toe-brachial index is abnormal.   Left: Resting left ankle-brachial index indicates mild left lower  extremity arterial disease. The left toe-brachial index is abnormal.  _______________  Left Lower Extremity Arterial Ultrasound 03/13/2021: Summary: Left: 30-49% stenosis noted in the superficial femoral artery. No  significant change as compared to previous study. Occluded mid ATA with  distal reconstitution vs. collateral flow at the ankle.  Occluded mid/distal peroneal artery.  Patent posterior tibial artery.  _______________  Myoview 02/07/2022:   Findings are consistent  with prior myocardial infarction. The study is intermediate risk.   No ST deviation was noted.   LV perfusion is abnormal. There is no evidence of ischemia. There is evidence of infarction. Defect 1: There is a medium defect with mild reduction in uptake present in the apical to basal inferior location(s) that is fixed. There is abnormal wall motion in the defect area. Consistent with infarction.   Left ventricular function is abnormal. Global function is moderately reduced. Nuclear stress EF: 36 %. The left ventricular ejection fraction is moderately decreased (30-44%). End diastolic cavity size is  moderately enlarged. End systolic cavity size is moderately enlarged.   Prior study available for comparison.   Fixed inferior perfusion defect with hypokinesis consistent with infarct Moderate LV systolic dysfunction (EF 36%) Intermediate risk study due to decreased LV systolic function.  No ischemia _______________  Echocardiogram 02/21/2022: Impressions:  1. EF challenging to assess with afib/ challenging windows. Left  ventricular ejection fraction, by estimation, is 50 to 55%. The left  ventricle has low normal function. The left ventricle demonstrates global  hypokinesis. There is mild concentric left  ventricular hypertrophy. Left ventricular diastolic parameters are  indeterminate.   2. Right ventricular systolic function is normal. The right ventricular  size is normal.   3. Left atrial size was mildly dilated.   4. Trivial mitral valve regurgitation.   5. There is mild calcification of the aortic valve. Aortic valve  regurgitation is not visualized.   6. There is mild dilatation of the ascending aorta, measuring 36 mm.   7. The inferior vena cava is normal in size with greater than 50%  respiratory variability, suggesting right atrial pressure of 3 mmHg.  _______________  Monitor 02/2022:   Predominant underlying rhythm was atrial fibrillation with a rate range of 61 to 150 bpm.   2 short Ventricular runs (~Non-sustained Ventricular Tachycardia) noted: Fastest and longest was 14 beats (5.4 sec) w/ HR range of 102-107 bpm, avg 156 bpm. => Cannot exclude aberrantly conducted A-fib   A-fib with RVR and PVCs noted her symptoms.   Monitor shows 100% A-fib burden went through short ventricular runs.   Symptomatic A-fib RVR with PVCs. _______________  Carotid Dopplers 07/12/2022: Summary:  Right Carotid: Velocities in the right ICA are consistent with a 1-39%  stenosis.   Left Carotid: Velocities in the left ICA are consistent with a 40-59%  stenosis.   Vertebrals:  Bilateral systolic deceleration noted in bilateral vertebral               arteries.  Subclavians: Normal flow hemodynamics were seen in bilateral subclavian               arteries.    EKG:  EKG ordered today.   EKG Interpretation Date/Time:  Tuesday September 24 2022 17:05:20 EDT Ventricular Rate:  62 PR Interval:  192 QRS Duration:  108 QT Interval:  452 QTC Calculation: 458 R Axis:   -1  Text Interpretation: Normal sinus rhythm T wave abnormality, consider lateral ischemia When compared with ECG of 05-Aug-2022 14:44, No significant change was found Confirmed by Marjie Skiff [id number]) on 09/24/2022 5:11:29 PM    Recent Labs: 07/10/2022: ALT 22; BUN 23; Creatinine, Ser 2.16; Hemoglobin 15.4; Platelets 129; Potassium 4.1; Sodium 138; TSH 3.665  Recent Lipid Panel    Component Value Date/Time   CHOL 178 05/12/2018 1150   TRIG 343 (H) 05/12/2018 1150   HDL 42 05/12/2018 1150   CHOLHDL 4.2 05/12/2018 1150   CHOLHDL  6.0 08/30/2014 1437   VLDL NOT CALC 08/30/2014 1437   LDLCALC 67 05/12/2018 1150    Physical Exam:    Vital Signs: BP 100/62 (BP Location: Left Arm, Patient Position: Sitting, Cuff Size: Large)   Pulse 68   Ht 6\' 1"  (1.854 m)   Wt (!) 319 lb 12.8 oz (145.1 kg)   SpO2 95%   BMI 42.19 kg/m     Wt Readings from Last 3 Encounters:  09/24/22 (!) 319 lb 12.8 oz (145.1 kg)  08/05/22 (!) 335 lb 12.8 oz (152.3 kg)  07/25/22 (!) 317 lb (143.8 kg)     General: 67 y.o. morbidly obese Caucasian male in no acute distress. HEENT: Normocephalic and atraumatic. Sclera clear.  Neck: Supple. No JVD. Heart: RRR. Distinct S1 and S2. No murmurs, gallops, or rubs.  Lungs: No increased work of breathing. Clear to ausculation bilaterally. No wheezes, rhonchi, or rales.  Abdomen: Soft, non-distended, and non-tender to palpation.  Extremities: Trace lower extremity edema bilaterally. Chronic hyperpigmentation bilaterally consistent with chronic venous stasis.   Skin: Warm and  dry. Neuro: Alert and oriented x3. No focal deficits. Psych: Normal affect. Responds appropriately.   Assessment:    1. Syncope and collapse   2. Orthostatic hypotension   3. Coronary artery disease involving native coronary artery of native heart without angina pectoris   4. Persistent atrial fibrillation (HCC)   5. Chronic heart failure with preserved ejection fraction (HFpEF) (HCC)   6. PAD (peripheral artery disease) (HCC)   7. Carotid artery disease, unspecified laterality, unspecified type (HCC)   8. Chronic venous insufficiency   9. Essential hypertension   10. Hyperlipidemia, unspecified hyperlipidemia type   11. Type 2 diabetes mellitus with complication, with long-term current use of insulin (HCC)   12. Stage 3b chronic kidney disease (HCC)   13. Morbid obesity (HCC)     Plan:    Syncope Orthostatic Hypotension Patient reports 8 syncopal episodes in the past 2 months that sound secondary to orthostatic hypotension. She was seen by her PCP (Dr. Renne Crigler) for this last week and orthostatics were positive. His Lasix was decreased and a 2 week Zio monitor was placed which he is still wearing.  - He continues to have intermittent episodes of dizziness but they are less severe since decrease his Lasix. He had a brief dizzy/ near syncope episode in the office that resolved with laying down. He has not had any recurrent syncope since then.  - He remains orthostatic in the office today.  - Will decrease Coreg to 6.25mg  twice daily. - Discussed importance of changing positions slowly, staying well hydrated, elevating legs as much as possible, and wearing compressions stockings.  - Will await monitor results but do not anticipate any significant arrhythmias. Sounds consistent with orthostatic hypotension.  - Discussed Ramirez-Perez Law which advises no driving for 6 months after syncopal episode.   CAD S/p angioplasty to LCX in 2003 and then DES to LAD and DES x2 to LCX followed by stage PCI with  DES x3 to RCA in 04/2012. Last cath in 2015 showed stable disease. Myoview in 01/2022 showed evidence of prior infarction but no reversible ischemia. - No chest pain.  - No Aspirin given need for full anticoagulation.  - Continue Crestor and Zetia.  Persistent Atrial Fibrillation Initially diagnosed in 01/2022. LV function normal at that time. S/p DCCV in 02/2022 with quick return of sinus rhythm. He was loaded with Amiodarone and then underwent repeat DCCV in 07/2022 with successful return  of sinus rhythm.  - Maintaining sinus rhythm on EKG today. - Will decrease Coreg to 6.25mg  twice daily given symptomatic orthostatic hypotension with syncope. - Continue Amiodarone 200mg  daily. - Continue chronic anticoagulation with Eliquis 5mg  twice daily.  Chronic HFpEF Echo in 01/2022 showed LVEF of 50-55% with global hypokinesis and mild LVH, normal RV size and function, and mild dilatation of the ascending aorta measuring 36 mm. - Euvolemic on exam.  - Continue Lasix 80mg  twice daily. Previously on 160mg  twice daily but recently decreased by PCP given orthostatic hypotension. Advised patient that he can take an extra 80mg  as needed for weight gain or edema.  - Continue Losartan 12.5mg  daily. - Will decrease Coreg to 6.25mg  twice daily given orthostatic hypotension. - Continue Farxiga 10mg  daily.   PAD S/p successful balloon angioplasty of the right TP trunk into the posterior tibial artery followed by drug-coated balloon angioplasty of the TP trunk in 09/2019..Most recent ABIs in 02/2021 were suggestive of mild disease on the left (0.91) but were normal on the right (1.23). Left lower extremity arterial ultrasound at that time showed 30-49% stenosis of SFA and occluded mid ATA (with distal reconstitution vs collateral flow at the ankle) as well as an occluded mid to distal peroneal artery. - No Aspirin given need for full anticoagulation.  - Continue Crestor and Zetia. - He is due for repeat imaging but  did not have time to discuss today. Can discuss at follow-up visit.  Carotid Artery Disease S/p right CEA in 2005. Most recent carotid dopplers in 06/2022 showed 40-59% stenosis of left ICA and 1-39% stenosis of right ICA as well as bilateral systolic deceleration noted in bilateral vertebral arteries. - No Aspirin given need for full anticoagulation.  - Continue Crestor and Zetia. - Plan is for repeat imaging in 06/2023.  Chronic Venous Insufficiency with Venous Stasis Stable.  - Continue high dose Lasix as above.  Hypertension History of hypertension but now as significant orthostatic hypotension. See above. - Continue medications for CHF as above.  Hyperlipidemia - Continue Crestor 20mg  daily and Zetia 10mg  daily. - Did not have time to discuss in detail today.  Type 2 Diabetes Mellitus Hemoglobin A1c 6.9 in 06/2022. - Continue Mounjaro, Farxiga, and Insulin.  - Management per PCP.  CKD Stage III Baseline creatinine around 1.9 to 2.2. Creatinine 2.21 on last check in 06/2022. - He states PCP checked labs last week and said everything was stable. Will request most recent office visit and labs from PCP be faxed to [place].  Morbid Obesity BMI 42.  - Did not have time to discuss today,  Disposition: Follow up in 3-4 weeks.   Medication Adjustments/Labs and Tests Ordered: Current medicines are reviewed at length with the patient today.  Concerns regarding medicines are outlined above.  Orders Placed This Encounter  Procedures   EKG 12-Lead   Meds ordered this encounter  Medications   carvedilol (COREG) 12.5 MG tablet    Sig: Taking 6.25 mg by mouth twice daily    Dispense:  180 tablet    Refill:  3   furosemide (LASIX) 80 MG tablet    Sig: Take 1 tablet (80 mg total) by mouth 2 (two) times daily. May take additional tablet for weight gain of 3 pounds in one day or five pounds in one week    Dispense:  180 tablet    Refill:  3    Patient Instructions  Medication  Instructions:  TAKE THE LASIX 80MG , TAKE A TABLET  IN THE MORNING AND A TABLET IN THE EVENING. YOU MAY TAKE AN ADDITIONAL TABLET AS NEEDED FOR WEIGHT GAIN OF 3 POUNDS IN ONE DAY FIVE POUNDS IN ONE WEEK.   YOU WILL DECREASE YOUR CARVEDILOL TO 6.25 TWICE DAILY  *If you need a refill on your cardiac medications before your next appointment, please call your pharmacy*   Lab Work: NONE If you have labs (blood work) drawn today and your tests are completely normal, you will receive your results only by: MyChart Message (if you have MyChart) OR A paper copy in the mail If you have any lab test that is abnormal or we need to change your treatment, we will call you to review the results.   Testing/Procedures: NONE   Follow-Up: At [facility], you and your health needs are our priority.  As part of our continuing mission to provide you with exceptional heart care, we have created designated Provider Care Teams.  These Care Teams include your primary Cardiologist (physician) and Advanced Practice Providers (APPs -  Physician Assistants and Nurse Practitioners) who all work together to provide you with the care you need, when you need it.  We recommend signing up for the patient portal called "MyChart".  Sign up information is provided on this After Visit Summary.  MyChart is used to connect with patients for Virtual Visits (Telemedicine).  Patients are able to view lab/test results, encounter notes, upcoming appointments, etc.  Non-urgent messages can be sent to your provider as well.   To learn more about what you can do with MyChart, go to [email or web address].    Your next appointment:   3-4week(s)  Provider:   Marjie Skiff, PA-C         Signed, Corrin Parker, PA-C  09/24/2022 11:10 PM    [facility]

## 2022-09-24 ENCOUNTER — Encounter: Payer: Self-pay | Admitting: Student

## 2022-09-24 ENCOUNTER — Ambulatory Visit: Payer: Medicare Other | Attending: Student | Admitting: Student

## 2022-09-24 VITALS — BP 100/62 | HR 68 | Ht 73.0 in | Wt 319.8 lb

## 2022-09-24 DIAGNOSIS — I779 Disorder of arteries and arterioles, unspecified: Secondary | ICD-10-CM | POA: Diagnosis not present

## 2022-09-24 DIAGNOSIS — E785 Hyperlipidemia, unspecified: Secondary | ICD-10-CM

## 2022-09-24 DIAGNOSIS — I10 Essential (primary) hypertension: Secondary | ICD-10-CM

## 2022-09-24 DIAGNOSIS — Z794 Long term (current) use of insulin: Secondary | ICD-10-CM

## 2022-09-24 DIAGNOSIS — I739 Peripheral vascular disease, unspecified: Secondary | ICD-10-CM | POA: Diagnosis not present

## 2022-09-24 DIAGNOSIS — I4819 Other persistent atrial fibrillation: Secondary | ICD-10-CM

## 2022-09-24 DIAGNOSIS — I951 Orthostatic hypotension: Secondary | ICD-10-CM

## 2022-09-24 DIAGNOSIS — I872 Venous insufficiency (chronic) (peripheral): Secondary | ICD-10-CM | POA: Diagnosis not present

## 2022-09-24 DIAGNOSIS — E118 Type 2 diabetes mellitus with unspecified complications: Secondary | ICD-10-CM | POA: Diagnosis not present

## 2022-09-24 DIAGNOSIS — I5032 Chronic diastolic (congestive) heart failure: Secondary | ICD-10-CM

## 2022-09-24 DIAGNOSIS — I2510 Atherosclerotic heart disease of native coronary artery without angina pectoris: Secondary | ICD-10-CM

## 2022-09-24 DIAGNOSIS — R55 Syncope and collapse: Secondary | ICD-10-CM | POA: Diagnosis not present

## 2022-09-24 DIAGNOSIS — N1832 Chronic kidney disease, stage 3b: Secondary | ICD-10-CM

## 2022-09-24 MED ORDER — FUROSEMIDE 80 MG PO TABS: 80.0000 mg | ORAL_TABLET | Freq: Two times a day (BID) | ORAL | 3 refills | Status: DC

## 2022-09-24 MED ORDER — CARVEDILOL 12.5 MG PO TABS: ORAL_TABLET | ORAL | 3 refills | Status: DC

## 2022-09-24 NOTE — Patient Instructions (Addendum)
Medication Instructions:  TAKE THE LASIX 80MG , TAKE A TABLET IN THE MORNING AND A TABLET IN THE EVENING. YOU MAY TAKE AN ADDITIONAL TABLET AS NEEDED FOR WEIGHT GAIN OF 3 POUNDS IN ONE DAY FIVE POUNDS IN ONE WEEK.   YOU WILL DECREASE YOUR CARVEDILOL TO 6.25 TWICE DAILY  *If you need a refill on your cardiac medications before your next appointment, please call your pharmacy*   Lab Work: NONE If you have labs (blood work) drawn today and your tests are completely normal, you will receive your results only by: MyChart Message (if you have MyChart) OR A paper copy in the mail If you have any lab test that is abnormal or we need to change your treatment, we will call you to review the results.   Testing/Procedures: NONE   Follow-Up: At [facility], you and your health needs are our priority.  As part of our continuing mission to provide you with exceptional heart care, we have created designated Provider Care Teams.  These Care Teams include your primary Cardiologist (physician) and Advanced Practice Providers (APPs -  Physician Assistants and Nurse Practitioners) who all work together to provide you with the care you need, when you need it.  We recommend signing up for the patient portal called "MyChart".  Sign up information is provided on this After Visit Summary.  MyChart is used to connect with patients for Virtual Visits (Telemedicine).  Patients are able to view lab/test results, encounter notes, upcoming appointments, etc.  Non-urgent messages can be sent to your provider as well.   To learn more about what you can do with MyChart, go to [email or web address].    Your next appointment:   3-4week(s)  Provider:   Marjie Skiff, PA-C

## 2022-09-26 DIAGNOSIS — I83892 Varicose veins of left lower extremities with other complications: Secondary | ICD-10-CM | POA: Diagnosis not present

## 2022-09-26 DIAGNOSIS — L97922 Non-pressure chronic ulcer of unspecified part of left lower leg with fat layer exposed: Secondary | ICD-10-CM | POA: Diagnosis not present

## 2022-09-26 DIAGNOSIS — Z7985 Long-term (current) use of injectable non-insulin antidiabetic drugs: Secondary | ICD-10-CM | POA: Diagnosis not present

## 2022-09-26 DIAGNOSIS — L97822 Non-pressure chronic ulcer of other part of left lower leg with fat layer exposed: Secondary | ICD-10-CM | POA: Diagnosis not present

## 2022-09-26 DIAGNOSIS — I83028 Varicose veins of left lower extremity with ulcer other part of lower leg: Secondary | ICD-10-CM | POA: Diagnosis not present

## 2022-09-26 DIAGNOSIS — I96 Gangrene, not elsewhere classified: Secondary | ICD-10-CM | POA: Diagnosis not present

## 2022-09-26 DIAGNOSIS — Z7984 Long term (current) use of oral hypoglycemic drugs: Secondary | ICD-10-CM | POA: Diagnosis not present

## 2022-09-26 DIAGNOSIS — Z794 Long term (current) use of insulin: Secondary | ICD-10-CM | POA: Diagnosis not present

## 2022-09-26 DIAGNOSIS — E11622 Type 2 diabetes mellitus with other skin ulcer: Secondary | ICD-10-CM | POA: Diagnosis not present

## 2022-09-26 DIAGNOSIS — E1165 Type 2 diabetes mellitus with hyperglycemia: Secondary | ICD-10-CM | POA: Diagnosis not present

## 2022-09-26 DIAGNOSIS — I890 Lymphedema, not elsewhere classified: Secondary | ICD-10-CM | POA: Diagnosis not present

## 2022-10-01 ENCOUNTER — Ambulatory Visit: Payer: Medicare Other | Attending: Cardiology | Admitting: Cardiology

## 2022-10-02 ENCOUNTER — Encounter: Payer: Self-pay | Admitting: Cardiology

## 2022-10-02 DIAGNOSIS — E1140 Type 2 diabetes mellitus with diabetic neuropathy, unspecified: Secondary | ICD-10-CM | POA: Diagnosis not present

## 2022-10-02 DIAGNOSIS — D696 Thrombocytopenia, unspecified: Secondary | ICD-10-CM | POA: Diagnosis not present

## 2022-10-02 DIAGNOSIS — N1832 Chronic kidney disease, stage 3b: Secondary | ICD-10-CM | POA: Diagnosis not present

## 2022-10-02 DIAGNOSIS — Z0000 Encounter for general adult medical examination without abnormal findings: Secondary | ICD-10-CM | POA: Diagnosis not present

## 2022-10-02 DIAGNOSIS — I482 Chronic atrial fibrillation, unspecified: Secondary | ICD-10-CM | POA: Diagnosis not present

## 2022-10-02 DIAGNOSIS — I2510 Atherosclerotic heart disease of native coronary artery without angina pectoris: Secondary | ICD-10-CM | POA: Diagnosis not present

## 2022-10-02 DIAGNOSIS — I272 Pulmonary hypertension, unspecified: Secondary | ICD-10-CM | POA: Diagnosis not present

## 2022-10-02 DIAGNOSIS — I5032 Chronic diastolic (congestive) heart failure: Secondary | ICD-10-CM | POA: Diagnosis not present

## 2022-10-02 DIAGNOSIS — E039 Hypothyroidism, unspecified: Secondary | ICD-10-CM | POA: Diagnosis not present

## 2022-10-02 DIAGNOSIS — I10 Essential (primary) hypertension: Secondary | ICD-10-CM | POA: Diagnosis not present

## 2022-10-02 DIAGNOSIS — I739 Peripheral vascular disease, unspecified: Secondary | ICD-10-CM | POA: Diagnosis not present

## 2022-10-03 DIAGNOSIS — L97922 Non-pressure chronic ulcer of unspecified part of left lower leg with fat layer exposed: Secondary | ICD-10-CM | POA: Diagnosis not present

## 2022-10-03 DIAGNOSIS — Z7985 Long-term (current) use of injectable non-insulin antidiabetic drugs: Secondary | ICD-10-CM | POA: Diagnosis not present

## 2022-10-03 DIAGNOSIS — I83892 Varicose veins of left lower extremities with other complications: Secondary | ICD-10-CM | POA: Diagnosis not present

## 2022-10-03 DIAGNOSIS — Z794 Long term (current) use of insulin: Secondary | ICD-10-CM | POA: Diagnosis not present

## 2022-10-03 DIAGNOSIS — E1165 Type 2 diabetes mellitus with hyperglycemia: Secondary | ICD-10-CM | POA: Diagnosis not present

## 2022-10-03 DIAGNOSIS — L97822 Non-pressure chronic ulcer of other part of left lower leg with fat layer exposed: Secondary | ICD-10-CM | POA: Diagnosis not present

## 2022-10-03 DIAGNOSIS — I83028 Varicose veins of left lower extremity with ulcer other part of lower leg: Secondary | ICD-10-CM | POA: Diagnosis not present

## 2022-10-03 DIAGNOSIS — E11622 Type 2 diabetes mellitus with other skin ulcer: Secondary | ICD-10-CM | POA: Diagnosis not present

## 2022-10-03 DIAGNOSIS — I96 Gangrene, not elsewhere classified: Secondary | ICD-10-CM | POA: Diagnosis not present

## 2022-10-03 DIAGNOSIS — Z7984 Long term (current) use of oral hypoglycemic drugs: Secondary | ICD-10-CM | POA: Diagnosis not present

## 2022-10-03 DIAGNOSIS — I890 Lymphedema, not elsewhere classified: Secondary | ICD-10-CM | POA: Diagnosis not present

## 2022-10-05 DIAGNOSIS — E1165 Type 2 diabetes mellitus with hyperglycemia: Secondary | ICD-10-CM | POA: Diagnosis not present

## 2022-10-05 DIAGNOSIS — E1140 Type 2 diabetes mellitus with diabetic neuropathy, unspecified: Secondary | ICD-10-CM | POA: Diagnosis not present

## 2022-10-07 ENCOUNTER — Other Ambulatory Visit: Payer: Self-pay

## 2022-10-07 ENCOUNTER — Other Ambulatory Visit (HOSPITAL_COMMUNITY): Payer: Self-pay

## 2022-10-07 MED ORDER — CARVEDILOL 6.25 MG PO TABS: ORAL_TABLET | ORAL | 4 refills | Status: DC

## 2022-10-07 NOTE — Progress Notes (Signed)
Cardiology Office Note:    Date:  10/15/2022   ID:  CARDAE SAPP, DOB 12-Jul-1955, MRN [id number]  PCP:  [name], MD  Cardiologist:  [name], MD  Electrophysiologist:  [name], MD   Referring MD: [name], MD   Chief Complaint: follow-up of syncope and orthostatic hypotension  History of Present Illness:    Harold [name] is a 67 y.o. male with a history of severe multivessel CAD s/p remote and angioplasty in 2003 and then DES to LAD and DES x2 to LCX followed by stage PCI with DES x3 to RCA in 04/2012, chronic diastolic CHF, persistent atrial fibrillation on Eliquis, syncope secondary to orthostatic hypotension, PAD with bilateral lower extremity disease s/p balloon angioplasty of the right TP trunk into the posterior tibial artery followed by drug-coated balloon angioplasty of the TP trunk.in 09/2019, carotid artery disease s/p right CEA in 2005,  restrictive lung disease with pulmonary hypertension, obstructive sleep on CPAP, severe chronic venous insufficiency, hypertension, hyperlipidemia, type 2 diabetes mellitus, CKD stage III, and morbid obesity who is followed by Dr. Herbie Baltimore (primary Cardiologist), [name] (PAD), and Dr. Nelly Laurence (EP) and presents today for follow-up of syncope and orthostatic hypotension.   Patient has a complex medical history as states above. She has a history of CAD and PAD with prior cardiac and peripheral interventions. She underwent balloon angioplasty of LCX in 12/2001 and then DES to LAD and DES x2 to proximal to mid LCX followed by staged PCI with DEX x3 to RCA in 04/2012 (patient declined CABG at that time). Repeat cardiac catheterization in 04/2013 showed stable disease. Continue medical therapy was recommend at that time. Most recent ischemic evaluation was a Myoview in 01/2022 which showed finding consistent with prior infarction but no reversible ischemia. EF was mildly reduced at 36%. However, Echo in 02/2022 and showed  LVEF of 50-55% with global hypokinesis and mild LVH, normal RV size and function, and mild dilatation of the ascending aorta measuring 36 mm.   In regards to PAD, he has both known carotid and lower extremity disease. He underwent right CEA in 2005. Most recent dopplers in 06/2022 showed 40-59% stenosis of left ICA and 1-39% stenosis of right ICA as well as bilateral systolic deceleration noted in bilateral vertebral arteries. ABIs in 09/2019 were suggestive of moderate disease on the right (0.72) and mild disease on the left (0.90). [name] performed an peripheral angiogram in 09/2019 which showed mild non-obstructive iliac disease, significant calcified stenosis in the TP trunk on the right with an occluded proximal anterior tibial artery and significant disease in the proximal tibial artery. He underwent successful balloon angioplasty of the right TP trunk into the posterior tibial artery followed by drug-coated balloon angioplasty of the TP trunk. Of note, left lower extremity arterial angiography was not performed at that time due to CKD. Most recent ABIs in 02/2021 were suggestive of mild disease on the left (0.91) but were normal on the right (1.23). Left lower extremity arterial ultrasound at that time showed 30-49% stenosis of SFA and occluded mid ATA (with distal reconstitution vs collateral flow at the ankle) as well as an occluded mid to distal peroneal artery.   More recently he has had issues with atrial fibrillation. He was initially diagnosed with this in 01/2022 during an office visit with Dr. Herbie Baltimore. His Plavix was stopped and he was started on Eliquis. Echo and Myoview were ordered for further evaluation. Myoview showed finding consistent with prior infarction  but no reversible ischemia. EF was mildly reduced at 36%. However, Echo showed LVEF of 50-55% with global hypokinesis and mild LVH, normal RV size and function, and mild dilatation of the ascending aorta measuring 36 mm. Zio monitor was  also ordered to assess atrial fibrillation burden and showed continuous atrial fibrillation with 2 short runs of NSVT. Triggered symptoms coincided with atrial fibrillation with RVR and PVCs. He was referred to the [facility] and underwent DCCV on 03/13/2022 (required 2 shocks for restoration of sinus rhythm). Unfortunately, he was back in atrial fibrillation at follow-up visit in 03/2022. He was seen by Dr. Nelly Laurence who to discuss different antiarrhythmic options. He was able to weaned off Abilify and started on Amiodarone in 06/2022. He underwent repeat DCCV on 07/25/2022 after Amiodarone load and successful converted to sinus rhythm. He was last seen in the [facility] on 08/05/2022 at which time he was maintaining sinus rhythm.   Patient was last seen by me on 09/24/2022 at which time he reported 8 syncopal episodes over the last 2 months. These episodes were preceded by dizziness and tunnel vision after walking and then sitting back down. He was found to be orthostatic at his PCP office the week before and his Lasix was decreased and 2 week monitor was ordered. Monitor was still pending but he did feel like his dizziness had improved with the decrease in Lasix. He did have an episode of dizziness and near syncope in the office which resolved with lying down. He was orthostatic in the office with BP dropping from 155/82 when supine to 94/57 when standing. Coreg was decreased and conservative measures (compression stockings, changing positions slowly, and staying well hydrated) were recommended.  Patient presents today for follow-up. He states that about 2 weeks ago he lifted a heavy piece of furniture on Saturday and then again on Sunday and then later Sunday night he started "freezing." He was able to go to sleep but then woke up around 10pm and was unable to get out of bed due to feeling profoundly weak. His husband helped get him up. He then had a drop in his BP and fell. However, denies any syncope at that  time. He states this was an isolated episode and he has otherwise been doing well.  He did see his PCP after the above episode and PCP reportedly referred him to Neurology and PT which I agree with. He reports some occasional very mild dizziness but much improved compared to last episode. No recurrent syncope. No chest pain, palpitations, shortness of breath, orthopnea, or PND. He has chronic but stable lower extremity edema. He follows with Wound Care for this and his legs are currently wrapped. His weights have been stable at home. He is not very activity but states this is primarily due to his chronic back pain. He denies any claudication.  Of note, he states the Zio monitor that his PCP ordered was normal but I do not have the results from this. Will request that these be faxed to [place].   EKGs/Labs/Other Studies Reviewed:    The following studies were reviewed:  ABIs 03/13/2021: Summary:  Right: Resting right ankle-brachial index is within normal range. No  evidence of significant right lower extremity arterial disease. The right  toe-brachial index is abnormal.   Left: Resting left ankle-brachial index indicates mild left lower  extremity arterial disease. The left toe-brachial index is abnormal.  _______________   Left Lower Extremity Arterial Ultrasound 03/13/2021: Summary: Left: 30-49% stenosis  noted in the superficial femoral artery. No  significant change as compared to previous study. Occluded mid ATA with  distal reconstitution vs. collateral flow at the ankle.  Occluded mid/distal peroneal artery.  Patent posterior tibial artery.  _______________   Myoview 02/07/2022:   Findings are consistent with prior myocardial infarction. The study is intermediate risk.   No ST deviation was noted.   LV perfusion is abnormal. There is no evidence of ischemia. There is evidence of infarction. Defect 1: There is a medium defect with mild reduction in uptake present in the apical to basal  inferior location(s) that is fixed. There is abnormal wall motion in the defect area. Consistent with infarction.   Left ventricular function is abnormal. Global function is moderately reduced. Nuclear stress EF: 36 %. The left ventricular ejection fraction is moderately decreased (30-44%). End diastolic cavity size is moderately enlarged. End systolic cavity size is moderately enlarged.   Prior study available for comparison.   Fixed inferior perfusion defect with hypokinesis consistent with infarct Moderate LV systolic dysfunction (EF 36%) Intermediate risk study due to decreased LV systolic function.  No ischemia _______________   Echocardiogram 02/21/2022: Impressions:  1. EF challenging to assess with afib/ challenging windows. Left  ventricular ejection fraction, by estimation, is 50 to 55%. The left  ventricle has low normal function. The left ventricle demonstrates global  hypokinesis. There is mild concentric left  ventricular hypertrophy. Left ventricular diastolic parameters are  indeterminate.   2. Right ventricular systolic function is normal. The right ventricular  size is normal.   3. Left atrial size was mildly dilated.   4. Trivial mitral valve regurgitation.   5. There is mild calcification of the aortic valve. Aortic valve  regurgitation is not visualized.   6. There is mild dilatation of the ascending aorta, measuring 36 mm.   7. The inferior vena cava is normal in size with greater than 50%  respiratory variability, suggesting right atrial pressure of 3 mmHg.  _______________   Monitor 02/2022:   Predominant underlying rhythm was atrial fibrillation with a rate range of 61 to 150 bpm.   2 short Ventricular runs (~Non-sustained Ventricular Tachycardia) noted: Fastest and longest was 14 beats (5.4 sec) w/ HR range of 102-107 bpm, avg 156 bpm. => Cannot exclude aberrantly conducted A-fib   A-fib with RVR and PVCs noted her symptoms.   Monitor shows 100% A-fib burden  went through short ventricular runs.   Symptomatic A-fib RVR with PVCs. _______________   Carotid Dopplers 07/12/2022: Summary:  Right Carotid: Velocities in the right ICA are consistent with a 1-39%  stenosis.   Left Carotid: Velocities in the left ICA are consistent with a 40-59%  stenosis.   Vertebrals: Bilateral systolic deceleration noted in bilateral vertebral               arteries.  Subclavians: Normal flow hemodynamics were seen in bilateral subclavian               arteries.   EKG:  EKG not ordered today.   Recent Labs: 07/10/2022: ALT 22; BUN 23; Creatinine, Ser 2.16; Hemoglobin 15.4; Platelets 129; Potassium 4.1; Sodium 138; TSH 3.665  Recent Lipid Panel    Component Value Date/Time   CHOL 178 05/12/2018 1150   TRIG 343 (H) 05/12/2018 1150   HDL 42 05/12/2018 1150   CHOLHDL 4.2 05/12/2018 1150   CHOLHDL 6.0 08/30/2014 1437   VLDL NOT CALC 08/30/2014 1437   LDLCALC 67 05/12/2018 1150  Physical Exam:    Vital Signs: BP 128/66   Pulse 74   Ht 6\' 1"  (1.854 m)   Wt (!) 323 lb 12.8 oz (146.9 kg)   SpO2 98%   BMI 42.72 kg/m     Wt Readings from Last 3 Encounters:  10/15/22 (!) 323 lb 12.8 oz (146.9 kg)  09/24/22 (!) 319 lb 12.8 oz (145.1 kg)  08/05/22 (!) 335 lb 12.8 oz (152.3 kg)     General: 67 y.o. morbidly obese Caucasian male in no acute distress. HEENT: Normocephalic and atraumatic. Sclera clear.  Neck: Supple. Bilateral carotid bruits (right > left). No JVD. Heart:  RRR. Distinct S1 and S2. No murmurs, gallops, or rubs.  Lungs: No increased work of breathing. Clear to ausculation bilaterally. No wheezes, rhonchi, or rales.  Abdomen: Soft, non-distended, and non-tender to palpation.  Extremities: Bilateral lower extremities wrapped with 1+ pitting edema bilaterally.   Skin: Warm and dry. Neuro: Alert and oriented x3. No focal deficits. Psych: Normal affect. Responds appropriately.  Assessment:    1. Syncope and collapse   2. Orthostatic  hypotension   3. Coronary artery disease involving native coronary artery of native heart without angina pectoris   4. Persistent atrial fibrillation (HCC)   5. Chronic heart failure with preserved ejection fraction (HFpEF) (HCC)   6. PAD (peripheral artery disease) (HCC)   7. Bilateral carotid artery stenosis   8. Chronic venous insufficiency   9. Primary hypertension   10. Hyperlipidemia, unspecified hyperlipidemia type   11. Type 2 diabetes mellitus with complication, with long-term current use of insulin (HCC)   12. Stage 3 chronic kidney disease, unspecified whether stage 3a or 3b CKD (HCC)   13. Morbid obesity (HCC)     Plan:    Syncope Orthostatic Hypotension Patient reported 8 syncopal episodes in the span of 2 months at last office visit earlier this months. He was found to have orthostatic hypotension. PCP ordered 2 week Zio monitor which was reportedly normal. Lasix and Coreg were decreased with improvement. - He describes one episode of near syncope that occurred when his BP dropped since last visit but no over syncope. Only has occasional very mild dizziness. Overall much improved. - Continue conservative measures: compression stockings, changing positions slowly, staying well hydrated, and elevating feet as much as possible.  - Will request a copy of recent Zio monitor results be faxed to [place]. - Discussed [place] which advises no driving for 6 months after syncopal episode.    CAD S/p angioplasty to LCX in 2003 and then DES to LAD and DES x2 to LCX followed by stage PCI with DES x3 to RCA in 04/2012. Last cath in 2015 showed stable disease. Myoview in 01/2022 showed evidence of prior infarction but no reversible ischemia. - No chest pain.  - No Aspirin given need for full anticoagulation.  - Continue Crestor and Zetia.   Persistent Atrial Fibrillation Initially diagnosed in 01/2022. LV function normal at that time. S/p DCCV in 02/2022 with quick return of sinus rhythm. He was  loaded with Amiodarone and then underwent repeat DCCV in 07/2022 with successful return of sinus rhythm.  - Maintaining sinus rhythm on exam today.  - Continue Coreg to 3.125mg  twice daily.  - Continue Amiodarone 200mg  daily. TSH and LFTs were normal in 06/2022.  - Continue chronic anticoagulation with Eliquis 5mg  twice daily.   Chronic HFpEF Echo in 01/2022 showed LVEF of 50-55% with global hypokinesis and mild LVH, normal RV size and function,  and mild dilatation of the ascending aorta measuring 36 mm. - Volume status somewhat difficult to assess due to body habitus but he does not look significantly volume overloaded. He has chronic lower extremity edema but this is stable. No acute CHF symptoms.  - Continue Lasix 80mg  twice daily. Can take an extra 80mg  as needed for weight gain or edema.  - Continue Losartan 12.5mg  daily. - Continue Coreg to 3.125mg  mg twice daily. - Continue Farxiga 10mg  daily.    PAD S/p successful balloon angioplasty of the right TP trunk into the posterior tibial artery followed by drug-coated balloon angioplasty of the TP trunk in 09/2019. Most recent ABIs in 02/2021 were suggestive of mild disease on the left (0.91) but were normal on the right (1.23). Left lower extremity arterial ultrasound at that time showed 30-49% stenosis of SFA and occluded mid ATA (with distal reconstitution vs collateral flow at the ankle) as well as an occluded mid to distal peroneal artery. - No Aspirin given need for full anticoagulation.  - Continue Crestor and Zetia. - He is due for repeat imaging. Will order repeat ABIs and lower extremity arterial ultrasounds.    Carotid Artery Disease S/p right CEA in 2005. Most recent carotid dopplers in 06/2022 showed 40-59% stenosis of left ICA and 1-39% stenosis of right ICA as well as bilateral systolic deceleration noted in bilateral vertebral arteries. - No Aspirin given need for full anticoagulation.  - Continue Crestor and Zetia. - Plan is for  repeat imaging in 06/2023.   Chronic Venous Insufficiency with Venous Stasis Stable.  - Continue high dose Lasix as above. - Followed by Wound Clinic.   Hypertension History of hypertension but now as significant orthostatic hypotension. See above. - Continue medications for CHF as above.   Hyperlipidemia - Continue Crestor 20mg  daily and Zetia 10mg  daily. - He states he thinks his PCP recently checked his cholesterol during most recent physical a couple of weeks ago. Will ask for most recent labs to be faxed to [place].    Type 2 Diabetes Mellitus Hemoglobin A1c 6.9 in 06/2022. - Continue Mounjaro, Farxiga, and Insulin.  - Management per PCP.   CKD Stage III Baseline creatinine around 1.9 to 2.2. Creatinine 2.21 on last check in 06/2022. - Followed by Nephrology.   Morbid Obesity BMI 42.72.  - Discussed watching diet and increasing physical activity as tolerated. Overall, I think weight loss will be difficult for him given chronic back issues that limits his activity.   Disposition: Follow up in 3-4 months.   Medication Adjustments/Labs and Tests Ordered: Current medicines are reviewed at length with the patient today.  Concerns regarding medicines are outlined above.  Orders Placed This Encounter  Procedures   VAS [place] LOWER EXTREMITY ARTERIAL DUPLEX   VAS [place] ABI WITH/WO TBI   No orders of the defined types were placed in this encounter.   Patient Instructions  Medication Instructions:  Your physician recommends that you continue on your current medications as directed. Please refer to the Current Medication list given to you today.  *If you need a refill on your cardiac medications before your next appointment, please call your pharmacy*   Lab Work: NONE If you have labs (blood work) drawn today and your tests are completely normal, you will receive your results only by: MyChart Message (if you have MyChart) OR A paper copy in the mail If you have any lab test that is  abnormal or we need to change your treatment, we will  call you to review the results.   Testing/Procedures: Your physician has requested that you have an ankle brachial index (ABI). During this test an ultrasound and blood pressure cuff are used to evaluate the arteries that supply the arms and legs with blood. Allow thirty minutes for this exam. There are no restrictions or special instructions.   Your physician has requested that you have a lower or upper extremity arterial duplex. This test is an ultrasound of the arteries in the legs or arms. It looks at arterial blood flow in the legs and arms. Allow one hour for Lower and Upper Arterial scans. There are no restrictions or special instructions    Follow-Up: At [facility], you and your health needs are our priority.  As part of our continuing mission to provide you with exceptional heart care, we have created designated Provider Care Teams.  These Care Teams include your primary Cardiologist (physician) and Advanced Practice Providers (APPs -  Physician Assistants and Nurse Practitioners) who all work together to provide you with the care you need, when you need it.  We recommend signing up for the patient portal called "MyChart".  Sign up information is provided on this After Visit Summary.  MyChart is used to connect with patients for Virtual Visits (Telemedicine).  Patients are able to view lab/test results, encounter notes, upcoming appointments, etc.  Non-urgent messages can be sent to your provider as well.   To learn more about what you can do with MyChart, go to [email or web address].    Your next appointment:   3-4 month(s)  Provider:   Marjie Skiff, PA-C      Other Instructions YOUR PROVIDER RECOMMENDS THAT YOU GET PLENTY OF FLUIDS WEAR COMPRESSION STOCKINGS  ELEVATE BOTH LEGS SLOW AT CHANGING POSITIONS      Signed, Corrin Parker, PA-C  10/15/2022 10:13 AM    [facility]

## 2022-10-10 ENCOUNTER — Encounter: Payer: Self-pay | Admitting: Internal Medicine

## 2022-10-10 DIAGNOSIS — L97822 Non-pressure chronic ulcer of other part of left lower leg with fat layer exposed: Secondary | ICD-10-CM | POA: Diagnosis not present

## 2022-10-10 DIAGNOSIS — I96 Gangrene, not elsewhere classified: Secondary | ICD-10-CM | POA: Diagnosis not present

## 2022-10-10 DIAGNOSIS — E1165 Type 2 diabetes mellitus with hyperglycemia: Secondary | ICD-10-CM | POA: Diagnosis not present

## 2022-10-10 DIAGNOSIS — E11622 Type 2 diabetes mellitus with other skin ulcer: Secondary | ICD-10-CM | POA: Diagnosis not present

## 2022-10-10 DIAGNOSIS — I890 Lymphedema, not elsewhere classified: Secondary | ICD-10-CM | POA: Diagnosis not present

## 2022-10-10 DIAGNOSIS — I83028 Varicose veins of left lower extremity with ulcer other part of lower leg: Secondary | ICD-10-CM | POA: Diagnosis not present

## 2022-10-10 DIAGNOSIS — Z7984 Long term (current) use of oral hypoglycemic drugs: Secondary | ICD-10-CM | POA: Diagnosis not present

## 2022-10-10 DIAGNOSIS — Z7985 Long-term (current) use of injectable non-insulin antidiabetic drugs: Secondary | ICD-10-CM | POA: Diagnosis not present

## 2022-10-10 DIAGNOSIS — Z794 Long term (current) use of insulin: Secondary | ICD-10-CM | POA: Diagnosis not present

## 2022-10-11 DIAGNOSIS — R55 Syncope and collapse: Secondary | ICD-10-CM | POA: Diagnosis not present

## 2022-10-14 ENCOUNTER — Ambulatory Visit (INDEPENDENT_AMBULATORY_CARE_PROVIDER_SITE_OTHER): Payer: Medicare Other | Admitting: Podiatry

## 2022-10-14 ENCOUNTER — Encounter: Payer: Self-pay | Admitting: Podiatry

## 2022-10-14 DIAGNOSIS — L97522 Non-pressure chronic ulcer of other part of left foot with fat layer exposed: Secondary | ICD-10-CM | POA: Diagnosis not present

## 2022-10-14 NOTE — Progress Notes (Signed)
  Subjective:  Patient ID: Harold [name], male    DOB: 1955/04/07,  MRN: [id number]  Chief Complaint  Patient presents with   Wound Check    "It's good."    67 y.o. male returns with the above complaint. History confirmed with patient.  Says it is doing much better he has been using the ointment as directed  Objective:  Physical Exam: reduced sensation at plantar pulps of toes and feet, large spider veins, varicosities and torturous veins in the lower legs along with venous stasis dermatitis noted and +1 PT and DP pulses bilaterally, both feet are warm and well-perfused.  Dry skin and fissuring plantar heel bilateral.  No ulcerations Left Foot: Moderate to severe hallux valgus with semirigid hammertoes present, mycotic nails x5,   Callus on medial hallux.  Ulceration has healed on second toe with callus present here no signs of infection Right Foot: Mild to moderate hallux valgus with semirigid hammertoes present, mycotic nails since 5.  There is a callus on the medial hallux. right dorsal fifth PIPJ ulcer has healed with overlying hyperkeratosis.       Assessment:   1. Skin ulcer of toe of left foot with fat layer exposed (HCC)         Plan:  Patient was evaluated and treated and all questions answered.  Ulceration is healed he may leave open to air at this point, continue to apply either Neosporin or mupirocin to soften the hard callused skin here.  Return as scheduled for diabetic at risk footcare.  Continue to utilize bunion spacer for bunion so it does not rub on second toe     Return if symptoms worsen or fail to improve.

## 2022-10-15 ENCOUNTER — Encounter: Payer: Self-pay | Admitting: Student

## 2022-10-15 ENCOUNTER — Ambulatory Visit: Payer: Medicare Other | Attending: Student | Admitting: Student

## 2022-10-15 VITALS — BP 128/66 | HR 74 | Ht 73.0 in | Wt 323.8 lb

## 2022-10-15 DIAGNOSIS — I739 Peripheral vascular disease, unspecified: Secondary | ICD-10-CM | POA: Diagnosis not present

## 2022-10-15 DIAGNOSIS — I4819 Other persistent atrial fibrillation: Secondary | ICD-10-CM | POA: Diagnosis not present

## 2022-10-15 DIAGNOSIS — I5032 Chronic diastolic (congestive) heart failure: Secondary | ICD-10-CM | POA: Diagnosis not present

## 2022-10-15 DIAGNOSIS — I2510 Atherosclerotic heart disease of native coronary artery without angina pectoris: Secondary | ICD-10-CM

## 2022-10-15 DIAGNOSIS — I6523 Occlusion and stenosis of bilateral carotid arteries: Secondary | ICD-10-CM

## 2022-10-15 DIAGNOSIS — E785 Hyperlipidemia, unspecified: Secondary | ICD-10-CM

## 2022-10-15 DIAGNOSIS — I951 Orthostatic hypotension: Secondary | ICD-10-CM

## 2022-10-15 DIAGNOSIS — I872 Venous insufficiency (chronic) (peripheral): Secondary | ICD-10-CM

## 2022-10-15 DIAGNOSIS — E118 Type 2 diabetes mellitus with unspecified complications: Secondary | ICD-10-CM | POA: Diagnosis not present

## 2022-10-15 DIAGNOSIS — I10 Essential (primary) hypertension: Secondary | ICD-10-CM | POA: Diagnosis not present

## 2022-10-15 DIAGNOSIS — N183 Chronic kidney disease, stage 3 unspecified: Secondary | ICD-10-CM

## 2022-10-15 DIAGNOSIS — R55 Syncope and collapse: Secondary | ICD-10-CM

## 2022-10-15 DIAGNOSIS — Z794 Long term (current) use of insulin: Secondary | ICD-10-CM

## 2022-10-15 NOTE — Patient Instructions (Signed)
Medication Instructions:  Your physician recommends that you continue on your current medications as directed. Please refer to the Current Medication list given to you today.  *If you need a refill on your cardiac medications before your next appointment, please call your pharmacy*   Lab Work: NONE If you have labs (blood work) drawn today and your tests are completely normal, you will receive your results only by: MyChart Message (if you have MyChart) OR A paper copy in the mail If you have any lab test that is abnormal or we need to change your treatment, we will call you to review the results.   Testing/Procedures: Your physician has requested that you have an ankle brachial index (ABI). During this test an ultrasound and blood pressure cuff are used to evaluate the arteries that supply the arms and legs with blood. Allow thirty minutes for this exam. There are no restrictions or special instructions.   Your physician has requested that you have a lower or upper extremity arterial duplex. This test is an ultrasound of the arteries in the legs or arms. It looks at arterial blood flow in the legs and arms. Allow one hour for Lower and Upper Arterial scans. There are no restrictions or special instructions    Follow-Up: At [facility], you and your health needs are our priority.  As part of our continuing mission to provide you with exceptional heart care, we have created designated Provider Care Teams.  These Care Teams include your primary Cardiologist (physician) and Advanced Practice Providers (APPs -  Physician Assistants and Nurse Practitioners) who all work together to provide you with the care you need, when you need it.  We recommend signing up for the patient portal called "MyChart".  Sign up information is provided on this After Visit Summary.  MyChart is used to connect with patients for Virtual Visits (Telemedicine).  Patients are able to view lab/test results, encounter  notes, upcoming appointments, etc.  Non-urgent messages can be sent to your provider as well.   To learn more about what you can do with MyChart, go to [email or web address].    Your next appointment:   3-4 month(s)  Provider:   Marjie Skiff, PA-C      Other Instructions YOUR PROVIDER RECOMMENDS THAT YOU GET PLENTY OF FLUIDS WEAR COMPRESSION STOCKINGS  ELEVATE BOTH LEGS SLOW AT CHANGING POSITIONS

## 2022-10-17 ENCOUNTER — Other Ambulatory Visit: Payer: Self-pay | Admitting: Student

## 2022-10-17 DIAGNOSIS — L97822 Non-pressure chronic ulcer of other part of left lower leg with fat layer exposed: Secondary | ICD-10-CM | POA: Diagnosis not present

## 2022-10-17 DIAGNOSIS — Z794 Long term (current) use of insulin: Secondary | ICD-10-CM

## 2022-10-17 DIAGNOSIS — I96 Gangrene, not elsewhere classified: Secondary | ICD-10-CM | POA: Diagnosis not present

## 2022-10-17 DIAGNOSIS — I872 Venous insufficiency (chronic) (peripheral): Secondary | ICD-10-CM

## 2022-10-17 DIAGNOSIS — I951 Orthostatic hypotension: Secondary | ICD-10-CM

## 2022-10-17 DIAGNOSIS — I2510 Atherosclerotic heart disease of native coronary artery without angina pectoris: Secondary | ICD-10-CM

## 2022-10-17 DIAGNOSIS — I4819 Other persistent atrial fibrillation: Secondary | ICD-10-CM

## 2022-10-17 DIAGNOSIS — I83028 Varicose veins of left lower extremity with ulcer other part of lower leg: Secondary | ICD-10-CM | POA: Diagnosis not present

## 2022-10-17 DIAGNOSIS — E785 Hyperlipidemia, unspecified: Secondary | ICD-10-CM

## 2022-10-17 DIAGNOSIS — I5032 Chronic diastolic (congestive) heart failure: Secondary | ICD-10-CM

## 2022-10-17 DIAGNOSIS — Z7984 Long term (current) use of oral hypoglycemic drugs: Secondary | ICD-10-CM | POA: Diagnosis not present

## 2022-10-17 DIAGNOSIS — I890 Lymphedema, not elsewhere classified: Secondary | ICD-10-CM | POA: Diagnosis not present

## 2022-10-17 DIAGNOSIS — N183 Chronic kidney disease, stage 3 unspecified: Secondary | ICD-10-CM

## 2022-10-17 DIAGNOSIS — I6523 Occlusion and stenosis of bilateral carotid arteries: Secondary | ICD-10-CM

## 2022-10-17 DIAGNOSIS — I10 Essential (primary) hypertension: Secondary | ICD-10-CM

## 2022-10-17 DIAGNOSIS — Z7985 Long-term (current) use of injectable non-insulin antidiabetic drugs: Secondary | ICD-10-CM | POA: Diagnosis not present

## 2022-10-17 DIAGNOSIS — R55 Syncope and collapse: Secondary | ICD-10-CM

## 2022-10-17 DIAGNOSIS — E1165 Type 2 diabetes mellitus with hyperglycemia: Secondary | ICD-10-CM | POA: Diagnosis not present

## 2022-10-17 DIAGNOSIS — I739 Peripheral vascular disease, unspecified: Secondary | ICD-10-CM

## 2022-10-17 DIAGNOSIS — E11622 Type 2 diabetes mellitus with other skin ulcer: Secondary | ICD-10-CM | POA: Diagnosis not present

## 2022-10-18 ENCOUNTER — Ambulatory Visit: Payer: Medicare Other | Attending: Cardiology | Admitting: Cardiology

## 2022-10-18 ENCOUNTER — Encounter: Payer: Self-pay | Admitting: Cardiology

## 2022-10-18 VITALS — BP 142/78 | HR 70 | Ht 73.0 in | Wt 318.6 lb

## 2022-10-18 DIAGNOSIS — N183 Chronic kidney disease, stage 3 unspecified: Secondary | ICD-10-CM | POA: Diagnosis not present

## 2022-10-18 DIAGNOSIS — E1122 Type 2 diabetes mellitus with diabetic chronic kidney disease: Secondary | ICD-10-CM | POA: Diagnosis not present

## 2022-10-18 DIAGNOSIS — N1831 Chronic kidney disease, stage 3a: Secondary | ICD-10-CM | POA: Diagnosis not present

## 2022-10-18 DIAGNOSIS — D631 Anemia in chronic kidney disease: Secondary | ICD-10-CM | POA: Diagnosis not present

## 2022-10-18 DIAGNOSIS — N2581 Secondary hyperparathyroidism of renal origin: Secondary | ICD-10-CM | POA: Diagnosis not present

## 2022-10-18 DIAGNOSIS — R809 Proteinuria, unspecified: Secondary | ICD-10-CM | POA: Diagnosis not present

## 2022-10-18 DIAGNOSIS — N189 Chronic kidney disease, unspecified: Secondary | ICD-10-CM | POA: Diagnosis not present

## 2022-10-18 DIAGNOSIS — I129 Hypertensive chronic kidney disease with stage 1 through stage 4 chronic kidney disease, or unspecified chronic kidney disease: Secondary | ICD-10-CM | POA: Diagnosis not present

## 2022-10-18 DIAGNOSIS — Z79899 Other long term (current) drug therapy: Secondary | ICD-10-CM | POA: Diagnosis not present

## 2022-10-18 DIAGNOSIS — R55 Syncope and collapse: Secondary | ICD-10-CM | POA: Diagnosis not present

## 2022-10-18 DIAGNOSIS — I4819 Other persistent atrial fibrillation: Secondary | ICD-10-CM

## 2022-10-18 NOTE — Patient Instructions (Signed)
Medication Instructions:  The current medical regimen is effective;  continue present plan and medications.  *If you need a refill on your cardiac medications before your next appointment, please call your pharmacy*   Follow-Up: At [facility], you and your health needs are our priority.  As part of our continuing mission to provide you with exceptional heart care, we have created designated Provider Care Teams.  These Care Teams include your primary Cardiologist (physician) and Advanced Practice Providers (APPs -  Physician Assistants and Nurse Practitioners) who all work together to provide you with the care you need, when you need it.  We recommend signing up for the patient portal called "MyChart".  Sign up information is provided on this After Visit Summary.  MyChart is used to connect with patients for Virtual Visits (Telemedicine).  Patients are able to view lab/test results, encounter notes, upcoming appointments, etc.  Non-urgent messages can be sent to your provider as well.   To learn more about what you can do with MyChart, go to [email or web address].    Your next appointment:   Follow up after scans  Provider:   Steffanie Dunn, MD

## 2022-10-18 NOTE — Progress Notes (Signed)
Electrophysiology Office Note:    Date:  10/18/2022   ID:  Harold, [name] Apr 30, 1955, MRN [id number]  [facility] Cardiologist:  [name], MD  [facility] Electrophysiologist:  [name], MD   Referring MD: [name], MD   Chief Complaint: Atrial fibrillation  History of Present Illness:    Harold [name] is a 67 y.o. malewho I am seeing today for an evaluation of atrial fibrillation at the request of Harold Loa, PA-C.  The patient was last seen by [facility] on August 05, 2022.  The patient has a medical history that includes carotid artery disease, obstructive sleep apnea, morbid obesity, peripheral arterial disease, hypertension, obesity, CKD, diabetes, coronary artery disease, hyperlipidemia, A-fib.  He was diagnosed with atrial fibrillation in December 2023.  He was fatigued while in atrial fibrillation.  He takes Eliquis for stroke prophylaxis.  He had a cardioversion in January 2024.  He felt better after the cardioversion but had a quick return to atrial fibrillation.  He was seen by Dr. Nelly Laurence who recommended medical therapy.  He started amiodarone on Jun 24, 2022 and had a repeat cardioversion on June 6.  When he saw Clide Cliff on June 17, he was still in sinus rhythm.  At that appointment he reported fall/balance issues he reported falling 10 times and the patient was extremely concerned about his risk of bleeding.  He saw [facility] in clinic on October 15, 2022.  This was for follow-up of syncope.  His syncopal episodes were thought to be in secondary to orthostatic hypotension.  He had orthostatic vitals demonstrating a drop from 155 mmHg to 94 mmHg when standing.  He wore a ZIO monitor from his primary care physician  --------------------  He is working with the wound clinic for 3 lower extremity wounds on his left side.  These emerged in January.  There is concerned that he has vascular disease that may require intervention.  He has Doppler study  scheduled for next week.  Since adjusting his carvedilol, he has had no further episodes of falls.  He does not feel orthostatic any longer.  He feels much better.      Their past medical, social and family history was reveiwed.   ROS:   Please see the history of present illness.    All other systems reviewed and are negative.  EKGs/Labs/Other Studies Reviewed:    The following studies were reviewed today:  09/19/2022 zio personally reviewed HR 53 - 125 bpm, average 65 Rare supraventricular and ventricular ectopy No AV block, pauses No sustained arrhythmias       Physical Exam:    VS:  BP (!) 142/78   Pulse 70   Ht 6\' 1"  (1.854 m)   Wt (!) 318 lb 9.6 oz (144.5 kg)   SpO2 99%   BMI 42.03 kg/m     Wt Readings from Last 3 Encounters:  10/18/22 (!) 318 lb 9.6 oz (144.5 kg)  10/15/22 (!) 323 lb 12.8 oz (146.9 kg)  09/24/22 (!) 319 lb 12.8 oz (145.1 kg)     GEN:  Well nourished, well developed in no acute distress.  Morbidly obese CARDIAC: RRR, no murmurs, rubs, gallops.  Left lower extremity in wrap. RESPIRATORY:  Clear to auscultation without rales, wheezing or rhonchi       ASSESSMENT AND PLAN:    1. Persistent atrial fibrillation (HCC)   2. Encounter for long-term (current) use of high-risk medication   3. Syncope and collapse     #  Persistent atrial fibrillation #High risk med monitoring-amiodarone Symptomatic.  Maintaining sinus rhythm on amiodarone. Eliquis for stroke prophylaxis Today I discussed his risk of stroke as it relates to his atrial fibrillation diagnosis.  We discussed anticoagulation and left atrial appendage occlusion for stroke risk mitigation.  Given his weight and active wounds, I think he is at prohibitive risk to undergo watchman implant.   Thankfully, he is no longer falling after some adjustments in his medications.  He is back on his Eliquis twice daily without missed doses.  We did discuss the pros and cons of anticoagulation versus  left atrial appendage occlusion in detail during today's clinic appointment.  #Orthostatic hypotension #Syncope Much improved.  Discussed pathophysiology of orthostatic hypotension during today's clinic appointment.  I encouraged him to stay adequately hydrated.  He should use compression stockings to the knees during the awake hours.   Follow-up with Dr. Nelly Laurence.  I will be available on an as-needed basis.  Signed, Rossie Muskrat. Lalla Brothers, MD, [facility], [facility] 10/18/2022 10:01 AM    Electrophysiology [facility]

## 2022-10-22 ENCOUNTER — Telehealth: Payer: Self-pay | Admitting: Student

## 2022-10-22 NOTE — Telephone Encounter (Signed)
STAT if patient feels like he/she is going to faint   1. Are you feeling dizzy, lightheaded, or faint right now? Not at this time,Only when he sits down or stand  up- this have been going on since he saw Callie last week   2. Have you passed out?   (If yes move to [facility]) no   3. Do you have any other symptoms? Feels like something is squeezing him  4. Have you checked your HR and BP (record if available)? Blood pressure is good and heart rate is good also

## 2022-10-22 NOTE — Telephone Encounter (Signed)
Left voicemail to return call to office.

## 2022-10-23 ENCOUNTER — Ambulatory Visit (HOSPITAL_COMMUNITY)
Admission: RE | Admit: 2022-10-23 | Discharge: 2022-10-23 | Disposition: A | Discharge status: Home / Self Care | Payer: Medicare Other | Source: Ambulatory Visit | Attending: Cardiovascular Disease | Admitting: Cardiovascular Disease

## 2022-10-23 DIAGNOSIS — I739 Peripheral vascular disease, unspecified: Secondary | ICD-10-CM | POA: Insufficient documentation

## 2022-10-23 NOTE — Telephone Encounter (Signed)
Patient states his symptoms are the same as his last OV .  He states he is not passing out. He is doing the recommendations as noted per last OV. He has a test today ordered from visit and noted we will call after resulted to let him know what to do going forward.

## 2022-10-23 NOTE — Telephone Encounter (Signed)
Patient is returning call. Please advise? 

## 2022-10-23 NOTE — Telephone Encounter (Signed)
Call to patient. LM to call office.  His Arterial U/S is for today at 2pm   Per last OV had these symptoms and advised to do the following:  Continue conservative measures: compression stockings, changing positions slowly, staying well hydrated, and elevating feet as much as possible.

## 2022-10-24 DIAGNOSIS — R600 Localized edema: Secondary | ICD-10-CM | POA: Diagnosis not present

## 2022-10-24 DIAGNOSIS — E11622 Type 2 diabetes mellitus with other skin ulcer: Secondary | ICD-10-CM | POA: Diagnosis not present

## 2022-10-24 DIAGNOSIS — L97319 Non-pressure chronic ulcer of right ankle with unspecified severity: Secondary | ICD-10-CM | POA: Diagnosis not present

## 2022-10-24 DIAGNOSIS — Z7984 Long term (current) use of oral hypoglycemic drugs: Secondary | ICD-10-CM | POA: Diagnosis not present

## 2022-10-24 DIAGNOSIS — S90821D Blister (nonthermal), right foot, subsequent encounter: Secondary | ICD-10-CM | POA: Diagnosis not present

## 2022-10-24 DIAGNOSIS — Z794 Long term (current) use of insulin: Secondary | ICD-10-CM | POA: Diagnosis not present

## 2022-10-24 DIAGNOSIS — S81001D Unspecified open wound, right knee, subsequent encounter: Secondary | ICD-10-CM | POA: Diagnosis not present

## 2022-10-24 DIAGNOSIS — L89512 Pressure ulcer of right ankle, stage 2: Secondary | ICD-10-CM | POA: Diagnosis not present

## 2022-10-24 LAB — VAS US ABI WITH/WO TBI
Left ABI: 0.97
Right ABI: 1.16

## 2022-10-24 NOTE — Telephone Encounter (Signed)
He is on [place] which can cause some orthostatic hypotension. We can try stopping this to see if that helps with his dizziness. He should continue conservative measures like wearing compression stockings, staying well hydrated, elevating feet as much as possible, and changing positions slowly. [name] does have a little bit of a diuretic effect so he should also continue to monitor his weight closely at home.   Also, he had lower extremity arterial ultrasounds and ABIs done yesterday. The final read is not back for these but the preliminary read (which is usually accurate) shows evidence of blockages in both legs. He has previously seen [name] for his PAD. Can we please get him back in to see [name].  Thank you! Harold [name]

## 2022-10-25 NOTE — Telephone Encounter (Signed)
Left message to call back  

## 2022-10-25 NOTE — Telephone Encounter (Signed)
Pt has appt with Dr Melissa Montane 9-10

## 2022-10-25 NOTE — Telephone Encounter (Signed)
Patient is returning call.  °

## 2022-10-25 NOTE — Telephone Encounter (Signed)
Spoke with patient and he is aware of Callie's recommendations and verbalized understanding.  Scheduled with [name] 9/10

## 2022-10-29 ENCOUNTER — Ambulatory Visit: Payer: Medicare Other | Attending: Cardiovascular Disease | Admitting: Cardiovascular Disease

## 2022-10-29 ENCOUNTER — Encounter: Payer: Self-pay | Admitting: Cardiovascular Disease

## 2022-10-29 VITALS — BP 148/70 | HR 72 | Ht 73.0 in | Wt 325.4 lb

## 2022-10-29 DIAGNOSIS — I2510 Atherosclerotic heart disease of native coronary artery without angina pectoris: Secondary | ICD-10-CM

## 2022-10-29 DIAGNOSIS — R2689 Other abnormalities of gait and mobility: Secondary | ICD-10-CM | POA: Diagnosis not present

## 2022-10-29 DIAGNOSIS — I739 Peripheral vascular disease, unspecified: Secondary | ICD-10-CM

## 2022-10-29 DIAGNOSIS — M6281 Muscle weakness (generalized): Secondary | ICD-10-CM | POA: Diagnosis not present

## 2022-10-29 DIAGNOSIS — I10 Essential (primary) hypertension: Secondary | ICD-10-CM | POA: Diagnosis not present

## 2022-10-29 DIAGNOSIS — R262 Difficulty in walking, not elsewhere classified: Secondary | ICD-10-CM | POA: Diagnosis not present

## 2022-10-29 DIAGNOSIS — E785 Hyperlipidemia, unspecified: Secondary | ICD-10-CM | POA: Diagnosis not present

## 2022-10-29 DIAGNOSIS — H8110 Benign paroxysmal vertigo, unspecified ear: Secondary | ICD-10-CM | POA: Diagnosis not present

## 2022-10-29 NOTE — Progress Notes (Unsigned)
Cardiology Office Note   Date:  10/30/2022   ID:  Harold [name], DOB 1955/07/13, MRN [id number]  PCP:  [name], MD  Cardiologist: Dr. Herbie Baltimore.  No chief complaint on file.      History of Present Illness: Harold [name] is a 67 y.o. male who is here today for follow-up visit regarding peripheral arterial disease.    He has known history of carotid disease, obstructive sleep apnea on CPAP, peripheral arterial disease, essential hypertension, obesity, chronic kidney disease, type 2 diabetes , Atrial fibrillation and hyperlipidemia.  He has known history of coronary artery disease with previous stenting.  He is not a smoker. He was seen in 2021 for bilateral calf claudication worse on the right. He underwent noninvasive vascular evaluation which showed an ABI of 0.72 on the right and 0.90 on the left. Duplex showed severe disease in the right TP trunk with occluded anterior tibial. On the left, the anterior tibial was occluded. Angiography was performed in August 2021 which showed mild nonobstructive iliac disease.  Imaging of the right lower extremity showed no obstructive disease affecting the SFA or popliteal artery.  There was significant calcified stenosis in the TP trunk with occluded anterior tibial artery and significant disease in the proximal posterior tibial artery which was the dominant vessel below the knee.  I performed successful balloon angioplasty of the right TP trunk into the posterior tibial artery followed by drug-coated balloon angioplasty to the TP trunk. Post procedure vascular studies showed an ABI of 0.9 on the right and 0.99 on the left.  Normal velocities were noted in the right TP trunk and posterior tibial artery.  He developed atrial fibrillation in 2023 and has been treated with amiodarone and anticoagulation.  He has known history of chronic venous insufficiency with stasis dermatitis and chronic venous wounds on his lower extremities.  He  has been following at the wound center at [facility].  The wounds on the right side healed completely.  However, he continues to struggle with superficial wounds affecting the left foot.  This has been present since October of last year.  He underwent recent Doppler studies which showed normal ABI on the right side and mildly reduced on the left side.  Duplex on the left showed significant stenosis in the distal popliteal artery and the proximal anterior tibial artery.  Peroneal arteries were noted to be occluded bilaterally.  He has been having issues with orthostatic dizziness but reports some improvement since Farxiga was discontinued and carvedilol decreased.  Past Medical History:  Diagnosis Date   Anginal pain [facility]) March 2015   Cardiac cath showed patent stents with distal LAD, circumflex-OM and RCA disease in small vessels.   Anxiety    CAD S/P percutaneous coronary angioplasty 2003, 04/2012   status post PCI to LAD, circumflex-OM 2, RCA   Carotid artery occlusion    Chronic renal insufficiency, stage II (mild)    Chronic venous insufficiency    varicosities, no reflux; dopplers 04/14/12- valvular insufficiency in the R and L GSV   Complication of anesthesia    COPD, mild (HCC)    Depression    situaltional    Diabetes mellitus    Diabetic neuropathy (HCC) 08/24/2019   Dyslipidemia associated with type 2 diabetes mellitus (HCC)    GERD (gastroesophageal reflux disease)    HTN (hypertension)    Hypothyroidism    Neuromuscular disorder (HCC)    neuropathy in feet   Neuropathy    notably  improved following PCI with improved cardiac function   Obesity    Obesity, Class II, BMI 35.0-39.9, with comorbidity (see actual BMI)    BMI 39; wgt loss efforts in place; seeing Dietician   PONV (postoperative nausea and vomiting)    "[employer]"   Pulmonary hypertension (HCC) 04/2012   PA pressure   Sleep apnea    uses nightly    Past Surgical History:  Procedure Laterality Date    ABDOMINAL AORTAGRAM N/A 11/15/2011   Procedure: ABDOMINAL Ronny Flurry;  Surgeon: Sherren Kerns, MD;  Location: [facility] CATH LAB;  Service: Cardiovascular;  [name];   ABDOMINAL AORTOGRAM W/LOWER EXTREMITY N/A 10/13/2019   Procedure: ABDOMINAL AORTOGRAM W/LOWER EXTREMITY;  Surgeon: Iran Ouch, MD;  Location: [facility] INVASIVE CV LAB;  Service: CV: Non-obst Ao-Iliac. R SFA-PopA patent with significant calcific stenosis of TP trunk-prox PTA (dominant) & CTO ATA  -> R PTA-TP PTCA   ABIs  04/27/2012   mild bilateral arterial insufficiency   BACK SURGERY  2005 x1   X2-2010   BUNIONECTOMY Right 11/11/2013   Procedure: RIGHT FOOT SILVER BUNIONECTOMY;  Surgeon: Toni Arthurs, MD;  Location: [facility];  Service: Orthopedics;  Laterality: Right;   CARDIAC CATHETERIZATION  12/11/2001   significant 3V CAD, normal LV function   CARDIOVERSION N/A 03/13/2022   Procedure: CARDIOVERSION;  Surgeon: Little Ishikawa, MD;  Location: [facility] ENDOSCOPY;  Service: Cardiovascular;  [name];   CARDIOVERSION N/A 07/25/2022   Procedure: CARDIOVERSION;  Surgeon: Jake Bathe, MD;  Location: [facility] INVASIVE CV LAB;  Service: Cardiovascular;  [name];   Carotid Doppler  04/27/2012   right internal carotid: Elevated velocities but no evidence of plaque. Left internal carotid 40-59%   CAROTID ENDARTERECTOMY  2005   Right; recent carotid Dopplers notes elevated velocities.    colonscopy     CORONARY ANGIOPLASTY  12/21/2001   PTCA of the distal and mid AV groove circ, unsuccessful PTCA of second OM total occlusion, unsuccessful PTCA of the apical LAD total occlusion   CORONARY ANGIOPLASTY WITH STENT PLACEMENT  04/29/2012   PCI to 3 RCA lesions, Promus Premiere 2.76mmx8mm distally, mid was 2.65mx28mm and proximally 2.75x64mm, EF 55-60%   CORONARY STENT PLACEMENT  04/28/2012   PCI to LAD (3x19mm Xience DES postdilated to 3.25) and circ prox and mid (2 overlappinmg 2.32mmx12mmXience DES  postdilated to 2.92mm)   DOPPLER ECHOCARDIOGRAPHY  04/28/2012   poor quality study: EF estimated 60-65%; unable to assess diastolic function (previously noted to have diastolic dysfunction); severely dilated left atrium and mild right atrium; dilated IVC consistent with increased central venous pressure.Marland Kitchen   HERNIA REPAIR     LEFT AND RIGHT HEART CATHETERIZATION WITH CORONARY ANGIOGRAM N/A 04/27/2012   Procedure: LEFT AND RIGHT HEART CATHETERIZATION WITH CORONARY ANGIOGRAM;  Surgeon: Marykay Lex, MD;  Location: [facility] CATH LAB;  Service: Cardiovascular;  [name];   LEFT AND RIGHT HEART CATHETERIZATION WITH CORONARY ANGIOGRAM N/A 05/14/2013   Procedure: LEFT AND RIGHT HEART CATHETERIZATION WITH CORONARY ANGIOGRAM;  Surgeon: Lennette Bihari, MD;  [name]; PCWP ;; [name], [name], Patent RCA ostial mid and distal stents - 70% distal runoff Dz   LUMBAR LAMINECTOMY/DECOMPRESSION MICRODISCECTOMY  01/07/2012   Procedure: LUMBAR LAMINECTOMY/DECOMPRESSION MICRODISCECTOMY 1 LEVEL;  Surgeon: Carmela Hurt, MD;  [name];  Service: Neurosurgery;  Laterality: Bilateral;   Lumbar Three-Four Decompression   LUMBAR LAMINECTOMY/DECOMPRESSION MICRODISCECTOMY N/A 07/26/2020   Procedure: Lumbar two-three Laminectomy/Foraminotomy;  Surgeon: Coletta Memos, MD;  [name];  Service: Neurosurgery;  [name];   PERCUTANEOUS CORONARY STENT INTERVENTION (PCI-S) N/A 04/28/2012   Procedure: PERCUTANEOUS CORONARY STENT INTERVENTION (PCI-S);  Surgeon: Lennette Bihari, MD;  Location: [facility] CATH LAB;  Service: Cardiovascular;  [name];   PERCUTANEOUS CORONARY STENT INTERVENTION (PCI-S) N/A 04/29/2012   Procedure: PERCUTANEOUS CORONARY STENT INTERVENTION (PCI-S);  Surgeon: Marykay Lex, MD;  Location: [facility] CATH LAB;  Service: Cardiovascular;   [name];   PERIPHERAL VASCULAR BALLOON ANGIOPLASTY Right 10/13/2019   Procedure: PERIPHERAL VASCULAR BALLOON ANGIOPLASTY;  Surgeon: Iran Ouch, MD;  Location: [facility] INVASIVE CV LAB;  Service: Cardiovascular;  Laterality: Right;  PTCA of R TP Trunk into PTA (Drug-coated) => Follow-up ABIs 0.9 on the right and 0.99 on the left.   SPINE SURGERY     UMBILICAL HERNIA REPAIR  2009   steel mesh insert     Current Outpatient Medications  Medication Sig Dispense Refill   allopurinol (ZYLOPRIM) 100 MG tablet Take 200 mg by mouth 2 (two) times daily.     amiodarone (PACERONE) 200 MG tablet Take 1 tablet (200 mg total) by mouth daily. 90 tablet 1   apixaban (ELIQUIS) 5 MG TABS tablet Take 1 tablet (5 mg total) by mouth 2 (two) times daily. 28 tablet 0   ARIPiprazole (ABILIFY) 5 MG tablet Take 5 mg by mouth daily.     B-D ULTRAFINE III SHORT PEN 31G X 8 MM MISC Inject 1 each into the skin 3 (three) times daily.     cadexomer iodine (IODOSORB) 0.9 % gel Apply 1 Application topically as needed.     carvedilol (COREG) 6.25 MG tablet Taking 6.25 mg by mouth twice daily 180 tablet 4   carvedilol (COREG) 6.25 MG tablet Take 3.125 mg by mouth 2 (two) times daily with a meal.     COLCRYS 0.6 MG tablet Take 0.6 mg by mouth daily.     Continuous Blood Gluc Sensor (FREESTYLE LIBRE 14 DAY SENSOR) MISC APPLY 1 SENSOR EVERY 14 DAYS  11   DENTA 5000 PLUS 1.1 % CREA dental cream Take 1 Application by mouth daily.     DULoxetine (CYMBALTA) 60 MG capsule Take 60 mg by mouth daily.     ezetimibe (ZETIA) 10 MG tablet Take 10 mg by mouth daily.     furosemide (LASIX) 80 MG tablet Take 1 tablet (80 mg total) by mouth 2 (two) times daily. May take additional tablet for weight gain of 3 pounds in one day or five pounds in one week 180 tablet 3   gabapentin (NEURONTIN) 800 MG tablet Take 800 mg by mouth 3 (three) times daily.     insulin regular human CONCENTRATED (HUMULIN R U-500 KWIKPEN) 500 UNIT/ML kwikpen Inject  75-150 Units into the skin See admin instructions. 75 units in the morning and 150 units at night     levothyroxine (SYNTHROID, LEVOTHROID) 88 MCG tablet Take 88 mcg by mouth daily before breakfast.     lidocaine (XYLOCAINE) 2 % jelly Place into the urethra as needed.     losartan (COZAAR) 25 MG tablet Take 12.5 mg by mouth daily.     meclizine (ANTIVERT) 25 MG tablet Take 25-37.5 mg by mouth See admin instructions. Taking 37.5mg  by mouth twice daily and 25 mg at night     mupirocin ointment (BACTROBAN) 2 %  Apply 1 Application topically 2 (two) times daily. 30 g 2   nitroGLYCERIN (NITROSTAT) 0.4 MG SL tablet PLACE 1 TAB UNDER THE TONGUE EVERY 5 MINUTES AS NEEDED FOR CHEST PAIN (SEVERE PRESSURE OR TIGHTNESS) 25 tablet 3   omeprazole (PRILOSEC) 20 MG capsule TAKE 1 CAPSULE BY MOUTH EVERY DAY 90 capsule 3   potassium chloride (MICRO-K) 10 MEQ CR capsule Take 10 mEq by mouth daily.   5   povidone-iodine (BETADINE) 10 % external solution Apply topically.     rosuvastatin (CRESTOR) 20 MG tablet Take 20 mg by mouth daily.     Sodium Hypochlorite 0.057 % LIQD Irrigate with as directed.     tirzepatide (MOUNJARO) 12.5 MG/0.5ML Pen Inject 12.5 mg into the skin once a week. 2 mL 2   tirzepatide (MOUNJARO) 12.5 MG/0.5ML Pen Inject 12.5 mg into the skin once a week. 2 mL 11   tirzepatide (MOUNJARO) 12.5 MG/0.5ML Pen Inject 12.5 mg into the skin once a week. 2 mL 5   tirzepatide (MOUNJARO) 15 MG/0.5ML Pen Inject 15 mg into the skin once a week. 6 mL 3   tirzepatide (MOUNJARO) 7.5 MG/0.5ML Pen Inject 7.5 mg into the skin once a week. 2 mL 2   TRESIBA FLEXTOUCH 200 UNIT/ML FlexTouch Pen Inject 50 Units into the skin daily.     FARXIGA 10 MG TABS tablet Take 10 mg by mouth daily. (Patient not taking: Reported on 10/29/2022)     No current facility-administered medications for this visit.    Allergies:   Septra [bactrim], Penicillins, Metformin and related, Other, Sulfamethoxazole-trimethoprim, and Wellbutrin  [bupropion]    Social History:  The patient  reports that he quit smoking about 19 years ago. His smoking use included cigarettes. He started smoking about 61 years ago. He has a 42 pack-year smoking history. He has never used smokeless tobacco. He reports that he does not drink alcohol and does not use drugs.   Family History:  The patient's He was adopted. Family history is unknown by patient.    ROS:  Please see the history of present illness.   Otherwise, review of systems are positive for none.   All other systems are reviewed and negative.    PHYSICAL EXAM: VS:  BP (!) 148/70 (BP Location: Left Arm, Patient Position: Sitting, Cuff Size: Large)   Pulse 72   Ht 6\' 1"  (1.854 m)   Wt (!) 325 lb 6.4 oz (147.6 kg)   SpO2 97%   BMI 42.93 kg/m  , BMI Body mass index is 42.93 kg/m. GEN: Well nourished, well developed, in no acute distress  HEENT: normal  Neck: no JVD, carotid bruits, or masses Cardiac: RRR; no murmurs, rubs, or gallops,no edema  Respiratory:  clear to auscultation bilaterally, normal work of breathing GI: soft, nontender, nondistended, + BS MS: no deformity or atrophy  Skin: warm and dry, no rash Neuro:  Strength and sensation are intact Psych: euthymic mood, full affect Chronic status dermatitis worse on the left side with superficial wounds around the ankle and the dorsal side of the left foot.   EKG:  EKG is not ordered today.    Recent Labs: 07/10/2022: ALT 22; TSH 3.665 10/29/2022: BUN 23; Creatinine, Ser 1.89; Hemoglobin 15.1; Platelets 169; Potassium 5.2; Sodium 140    Lipid Panel    Component Value Date/Time   CHOL 178 05/12/2018 1150   TRIG 343 (H) 05/12/2018 1150   HDL 42 05/12/2018 1150   CHOLHDL 4.2 05/12/2018 1150   CHOLHDL  6.0 08/30/2014 1437   VLDL NOT CALC 08/30/2014 1437   LDLCALC 67 05/12/2018 1150      Wt Readings from Last 3 Encounters:  10/29/22 (!) 325 lb 6.4 oz (147.6 kg)  10/18/22 (!) 318 lb 9.6 oz (144.5 kg)  10/15/22 (!)  323 lb 12.8 oz (146.9 kg)           No data to display            ASSESSMENT AND PLAN:  1. Peripheral arterial disease: Status post angioplasty to the right TP trunk and posterior tibial artery for severe claudication and chronic wounds in 2021.   He has chronic venous wounds mostly now affecting the left side.  He has no active wounds involving the right lower extremity.  In addition, I suspect that arterial insufficiency is contributing to slow healing as he has been dealing with the wounds on the left side since October of last year.  He is known to have significant disease affecting the left popliteal artery and tibial vessels. As such, I recommend proceeding with abdominal aortogram with left lower extremity angiography and possible endovascular intervention.  I discussed the procedure in details as well as risk and benefits.  He does have underlying chronic kidney disease and he will require 4 hours of hydration before the procedure.  2. Coronary artery disease involving native coronary arteries without angina: Continue medical therapy.  3. Essential hypertension: Blood pressure is mildly elevated today.  4. Hyperlipidemia: Currently on rosuvastatin and Zetia.  5.  Persistent atrial fibrillation: Currently maintaining sinus rhythm with amiodarone.  Hold Eliquis 2 days before the angiogram.    Disposition:   Proceed with angiography next week and follow-up after.  Signed,  Lorine Bears, MD  10/30/2022 10:18 AM    [facility]

## 2022-10-29 NOTE — H&P (View-Only) (Signed)
Cardiology Office Note   Date:  10/30/2022   ID:  Harold [name], DOB [birth year], MRN [id number]  PCP:  [name], MD  Cardiologist: Dr. Herbie Baltimore.  No chief complaint on file.      History of Present Illness: Harold [name] is a 67 y.o. male who is here today for follow-up visit regarding peripheral arterial disease.    He has known history of carotid disease, obstructive sleep apnea on CPAP, peripheral arterial disease, essential hypertension, obesity, chronic kidney disease, type 2 diabetes , Atrial fibrillation and hyperlipidemia.  He has known history of coronary artery disease with previous stenting.  He is not a smoker. He was seen in 2021 for bilateral calf claudication worse on the right. He underwent noninvasive vascular evaluation which showed an ABI of 0.72 on the right and 0.90 on the left. Duplex showed severe disease in the right TP trunk with occluded anterior tibial. On the left, the anterior tibial was occluded. Angiography was performed in August 2021 which showed mild nonobstructive iliac disease.  Imaging of the right lower extremity showed no obstructive disease affecting the SFA or popliteal artery.  There was significant calcified stenosis in the TP trunk with occluded anterior tibial artery and significant disease in the proximal posterior tibial artery which was the dominant vessel below the knee.  I performed successful balloon angioplasty of the right TP trunk into the posterior tibial artery followed by drug-coated balloon angioplasty to the TP trunk. Post procedure vascular studies showed an ABI of 0.9 on the right and 0.99 on the left.  Normal velocities were noted in the right TP trunk and posterior tibial artery.  He developed atrial fibrillation in 2023 and has been treated with amiodarone and anticoagulation.  He has known history of chronic venous insufficiency with stasis dermatitis and chronic venous wounds on his lower extremities.  He  has been following at the wound center at [facility].  The wounds on the right side healed completely.  However, he continues to struggle with superficial wounds affecting the left foot.  This has been present since October of last year.  He underwent recent Doppler studies which showed normal ABI on the right side and mildly reduced on the left side.  Duplex on the left showed significant stenosis in the distal popliteal artery and the proximal anterior tibial artery.  Peroneal arteries were noted to be occluded bilaterally.  He has been having issues with orthostatic dizziness but reports some improvement since Farxiga was discontinued and carvedilol decreased.  Past Medical History:  Diagnosis Date   Anginal pain [facility]) March 2015   Cardiac cath showed patent stents with distal LAD, circumflex-OM and RCA disease in small vessels.   Anxiety    CAD S/P percutaneous coronary angioplasty 2003, 04/2012   status post PCI to LAD, circumflex-OM 2, RCA   Carotid artery occlusion    Chronic renal insufficiency, stage II (mild)    Chronic venous insufficiency    varicosities, no reflux; dopplers 04/14/12- valvular insufficiency in the R and L GSV   Complication of anesthesia    COPD, mild (HCC)    Depression    situaltional    Diabetes mellitus    Diabetic neuropathy (HCC) 08/24/2019   Dyslipidemia associated with type 2 diabetes mellitus (HCC)    GERD (gastroesophageal reflux disease)    HTN (hypertension)    Hypothyroidism    Neuromuscular disorder (HCC)    neuropathy in feet   Neuropathy    notably  improved following PCI with improved cardiac function   Obesity    Obesity, Class II, BMI 35.0-39.9, with comorbidity (see actual BMI)    BMI 39; wgt loss efforts in place; seeing Dietician   PONV (postoperative nausea and vomiting)    "[employer]"   Pulmonary hypertension (HCC) 04/2012   PA pressure   Sleep apnea    uses nightly    Past Surgical History:  Procedure Laterality Date    ABDOMINAL AORTAGRAM N/A 11/15/2011   Procedure: ABDOMINAL Ronny Flurry;  Surgeon: Sherren Kerns, MD;  Location: [facility] CATH LAB;  Service: Cardiovascular;  [name];   ABDOMINAL AORTOGRAM W/LOWER EXTREMITY N/A 10/13/2019   Procedure: ABDOMINAL AORTOGRAM W/LOWER EXTREMITY;  Surgeon: Iran Ouch, MD;  Location: [facility] INVASIVE CV LAB;  Service: CV: Non-obst Ao-Iliac. R SFA-PopA patent with significant calcific stenosis of TP trunk-prox PTA (dominant) & CTO ATA  -> R PTA-TP PTCA   ABIs  04/27/2012   mild bilateral arterial insufficiency   BACK SURGERY  2005 x1   X2-2010   BUNIONECTOMY Right 11/11/2013   Procedure: RIGHT FOOT SILVER BUNIONECTOMY;  Surgeon: Toni Arthurs, MD;  Location: [facility];  Service: Orthopedics;  Laterality: Right;   CARDIAC CATHETERIZATION  12/11/2001   significant 3V CAD, normal LV function   CARDIOVERSION N/A 03/13/2022   Procedure: CARDIOVERSION;  Surgeon: Little Ishikawa, MD;  Location: [facility] ENDOSCOPY;  Service: Cardiovascular;  [name];   CARDIOVERSION N/A 07/25/2022   Procedure: CARDIOVERSION;  Surgeon: Jake Bathe, MD;  Location: [facility] INVASIVE CV LAB;  Service: Cardiovascular;  [name];   Carotid Doppler  04/27/2012   right internal carotid: Elevated velocities but no evidence of plaque. Left internal carotid 40-59%   CAROTID ENDARTERECTOMY  2005   Right; recent carotid Dopplers notes elevated velocities.    colonscopy     CORONARY ANGIOPLASTY  12/21/2001   PTCA of the distal and mid AV groove circ, unsuccessful PTCA of second OM total occlusion, unsuccessful PTCA of the apical LAD total occlusion   CORONARY ANGIOPLASTY WITH STENT PLACEMENT  04/29/2012   PCI to 3 RCA lesions, Promus Premiere 2.43mmx8mm distally, mid was 2.14mx28mm and proximally 2.75x58mm, EF 55-60%   CORONARY STENT PLACEMENT  04/28/2012   PCI to LAD (3x81mm Xience DES postdilated to 3.25) and circ prox and mid (2 overlappinmg 2.34mmx12mmXience DES  postdilated to 2.69mm)   DOPPLER ECHOCARDIOGRAPHY  04/28/2012   poor quality study: EF estimated 60-65%; unable to assess diastolic function (previously noted to have diastolic dysfunction); severely dilated left atrium and mild right atrium; dilated IVC consistent with increased central venous pressure.Marland Kitchen   HERNIA REPAIR     LEFT AND RIGHT HEART CATHETERIZATION WITH CORONARY ANGIOGRAM N/A 04/27/2012   Procedure: LEFT AND RIGHT HEART CATHETERIZATION WITH CORONARY ANGIOGRAM;  Surgeon: Marykay Lex, MD;  Location: [facility] CATH LAB;  Service: Cardiovascular;  [name];   LEFT AND RIGHT HEART CATHETERIZATION WITH CORONARY ANGIOGRAM N/A 05/14/2013   Procedure: LEFT AND RIGHT HEART CATHETERIZATION WITH CORONARY ANGIOGRAM;  Surgeon: Lennette Bihari, MD;  [name]; PCWP ;; [name], [name], Patent RCA ostial mid and distal stents - 70% distal runoff Dz   LUMBAR LAMINECTOMY/DECOMPRESSION MICRODISCECTOMY  01/07/2012   Procedure: LUMBAR LAMINECTOMY/DECOMPRESSION MICRODISCECTOMY 1 LEVEL;  Surgeon: Carmela Hurt, MD;  [name];  Service: Neurosurgery;  Laterality: Bilateral;   Lumbar Three-Four Decompression   LUMBAR LAMINECTOMY/DECOMPRESSION MICRODISCECTOMY N/A 07/26/2020   Procedure: Lumbar two-three Laminectomy/Foraminotomy;  Surgeon: Coletta Memos, MD;  [name];  Service: Neurosurgery;  [name];   PERCUTANEOUS CORONARY STENT INTERVENTION (PCI-S) N/A 04/28/2012   Procedure: PERCUTANEOUS CORONARY STENT INTERVENTION (PCI-S);  Surgeon: Lennette Bihari, MD;  Location: [facility] CATH LAB;  Service: Cardiovascular;  [name];   PERCUTANEOUS CORONARY STENT INTERVENTION (PCI-S) N/A 04/29/2012   Procedure: PERCUTANEOUS CORONARY STENT INTERVENTION (PCI-S);  Surgeon: Marykay Lex, MD;  Location: [facility] CATH LAB;  Service: Cardiovascular;   [name];   PERIPHERAL VASCULAR BALLOON ANGIOPLASTY Right 10/13/2019   Procedure: PERIPHERAL VASCULAR BALLOON ANGIOPLASTY;  Surgeon: Iran Ouch, MD;  Location: [facility] INVASIVE CV LAB;  Service: Cardiovascular;  Laterality: Right;  PTCA of R TP Trunk into PTA (Drug-coated) => Follow-up ABIs 0.9 on the right and 0.99 on the left.   SPINE SURGERY     UMBILICAL HERNIA REPAIR  2009   steel mesh insert     Current Outpatient Medications  Medication Sig Dispense Refill   allopurinol (ZYLOPRIM) 100 MG tablet Take 200 mg by mouth 2 (two) times daily.     amiodarone (PACERONE) 200 MG tablet Take 1 tablet (200 mg total) by mouth daily. 90 tablet 1   apixaban (ELIQUIS) 5 MG TABS tablet Take 1 tablet (5 mg total) by mouth 2 (two) times daily. 28 tablet 0   ARIPiprazole (ABILIFY) 5 MG tablet Take 5 mg by mouth daily.     B-D ULTRAFINE III SHORT PEN 31G X 8 MM MISC Inject 1 each into the skin 3 (three) times daily.     cadexomer iodine (IODOSORB) 0.9 % gel Apply 1 Application topically as needed.     carvedilol (COREG) 6.25 MG tablet Taking 6.25 mg by mouth twice daily 180 tablet 4   carvedilol (COREG) 6.25 MG tablet Take 3.125 mg by mouth 2 (two) times daily with a meal.     COLCRYS 0.6 MG tablet Take 0.6 mg by mouth daily.     Continuous Blood Gluc Sensor (FREESTYLE LIBRE 14 DAY SENSOR) MISC APPLY 1 SENSOR EVERY 14 DAYS  11   DENTA 5000 PLUS 1.1 % CREA dental cream Take 1 Application by mouth daily.     DULoxetine (CYMBALTA) 60 MG capsule Take 60 mg by mouth daily.     ezetimibe (ZETIA) 10 MG tablet Take 10 mg by mouth daily.     furosemide (LASIX) 80 MG tablet Take 1 tablet (80 mg total) by mouth 2 (two) times daily. May take additional tablet for weight gain of 3 pounds in one day or five pounds in one week 180 tablet 3   gabapentin (NEURONTIN) 800 MG tablet Take 800 mg by mouth 3 (three) times daily.     insulin regular human CONCENTRATED (HUMULIN R U-500 KWIKPEN) 500 UNIT/ML kwikpen Inject  75-150 Units into the skin See admin instructions. 75 units in the morning and 150 units at night     levothyroxine (SYNTHROID, LEVOTHROID) 88 MCG tablet Take 88 mcg by mouth daily before breakfast.     lidocaine (XYLOCAINE) 2 % jelly Place into the urethra as needed.     losartan (COZAAR) 25 MG tablet Take 12.5 mg by mouth daily.     meclizine (ANTIVERT) 25 MG tablet Take 25-37.5 mg by mouth See admin instructions. Taking 37.5mg  by mouth twice daily and 25 mg at night     mupirocin ointment (BACTROBAN) 2 %  Apply 1 Application topically 2 (two) times daily. 30 g 2   nitroGLYCERIN (NITROSTAT) 0.4 MG SL tablet PLACE 1 TAB UNDER THE TONGUE EVERY 5 MINUTES AS NEEDED FOR CHEST PAIN (SEVERE PRESSURE OR TIGHTNESS) 25 tablet 3   omeprazole (PRILOSEC) 20 MG capsule TAKE 1 CAPSULE BY MOUTH EVERY DAY 90 capsule 3   potassium chloride (MICRO-K) 10 MEQ CR capsule Take 10 mEq by mouth daily.   5   povidone-iodine (BETADINE) 10 % external solution Apply topically.     rosuvastatin (CRESTOR) 20 MG tablet Take 20 mg by mouth daily.     Sodium Hypochlorite 0.057 % LIQD Irrigate with as directed.     tirzepatide (MOUNJARO) 12.5 MG/0.5ML Pen Inject 12.5 mg into the skin once a week. 2 mL 2   tirzepatide (MOUNJARO) 12.5 MG/0.5ML Pen Inject 12.5 mg into the skin once a week. 2 mL 11   tirzepatide (MOUNJARO) 12.5 MG/0.5ML Pen Inject 12.5 mg into the skin once a week. 2 mL 5   tirzepatide (MOUNJARO) 15 MG/0.5ML Pen Inject 15 mg into the skin once a week. 6 mL 3   tirzepatide (MOUNJARO) 7.5 MG/0.5ML Pen Inject 7.5 mg into the skin once a week. 2 mL 2   TRESIBA FLEXTOUCH 200 UNIT/ML FlexTouch Pen Inject 50 Units into the skin daily.     FARXIGA 10 MG TABS tablet Take 10 mg by mouth daily. (Patient not taking: Reported on 10/29/2022)     No current facility-administered medications for this visit.    Allergies:   Septra [bactrim], Penicillins, Metformin and related, Other, Sulfamethoxazole-trimethoprim, and Wellbutrin  [bupropion]    Social History:  The patient  reports that he quit smoking about 19 years ago. His smoking use included cigarettes. He started smoking about 61 years ago. He has a 42 pack-year smoking history. He has never used smokeless tobacco. He reports that he does not drink alcohol and does not use drugs.   Family History:  The patient's He was adopted. Family history is unknown by patient.    ROS:  Please see the history of present illness.   Otherwise, review of systems are positive for none.   All other systems are reviewed and negative.    PHYSICAL EXAM: VS:  BP (!) 148/70 (BP Location: Left Arm, Patient Position: Sitting, Cuff Size: Large)   Pulse 72   Ht 6\' 1"  (1.854 m)   Wt (!) 325 lb 6.4 oz (147.6 kg)   SpO2 97%   BMI 42.93 kg/m  , BMI Body mass index is 42.93 kg/m. GEN: Well nourished, well developed, in no acute distress  HEENT: normal  Neck: no JVD, carotid bruits, or masses Cardiac: RRR; no murmurs, rubs, or gallops,no edema  Respiratory:  clear to auscultation bilaterally, normal work of breathing GI: soft, nontender, nondistended, + BS MS: no deformity or atrophy  Skin: warm and dry, no rash Neuro:  Strength and sensation are intact Psych: euthymic mood, full affect Chronic status dermatitis worse on the left side with superficial wounds around the ankle and the dorsal side of the left foot.   EKG:  EKG is not ordered today.    Recent Labs: 07/10/2022: ALT 22; TSH 3.665 10/29/2022: BUN 23; Creatinine, Ser 1.89; Hemoglobin 15.1; Platelets 169; Potassium 5.2; Sodium 140    Lipid Panel    Component Value Date/Time   CHOL 178 05/12/2018 1150   TRIG 343 (H) 05/12/2018 1150   HDL 42 05/12/2018 1150   CHOLHDL 4.2 05/12/2018 1150   CHOLHDL  6.0 08/30/2014 1437   VLDL NOT CALC 08/30/2014 1437   LDLCALC 67 05/12/2018 1150      Wt Readings from Last 3 Encounters:  10/29/22 (!) 325 lb 6.4 oz (147.6 kg)  10/18/22 (!) 318 lb 9.6 oz (144.5 kg)  10/15/22 (!)  323 lb 12.8 oz (146.9 kg)           No data to display            ASSESSMENT AND PLAN:  1. Peripheral arterial disease: Status post angioplasty to the right TP trunk and posterior tibial artery for severe claudication and chronic wounds in 2021.   He has chronic venous wounds mostly now affecting the left side.  He has no active wounds involving the right lower extremity.  In addition, I suspect that arterial insufficiency is contributing to slow healing as he has been dealing with the wounds on the left side since October of last year.  He is known to have significant disease affecting the left popliteal artery and tibial vessels. As such, I recommend proceeding with abdominal aortogram with left lower extremity angiography and possible endovascular intervention.  I discussed the procedure in details as well as risk and benefits.  He does have underlying chronic kidney disease and he will require 4 hours of hydration before the procedure.  2. Coronary artery disease involving native coronary arteries without angina: Continue medical therapy.  3. Essential hypertension: Blood pressure is mildly elevated today.  4. Hyperlipidemia: Currently on rosuvastatin and Zetia.  5.  Persistent atrial fibrillation: Currently maintaining sinus rhythm with amiodarone.  Hold Eliquis 2 days before the angiogram.    Disposition:   Proceed with angiography next week and follow-up after.  Signed,  Lorine Bears, MD  10/30/2022 10:18 AM    [facility]

## 2022-10-29 NOTE — Patient Instructions (Signed)
Medication Instructions:  No changes *If you need a refill on your cardiac medications before your next appointment, please call your pharmacy*  Testing/Procedures: Your physician has requested that you have a peripheral vascular angiogram. This exam is performed at the hospital. During this exam IV contrast is used to look at arterial blood flow. Please review the information sheet given for details.    Follow-Up: At [facility], you and your health needs are our priority.  As part of our continuing mission to provide you with exceptional heart care, we have created designated Provider Care Teams.  These Care Teams include your primary Cardiologist (physician) and Advanced Practice Providers (APPs -  Physician Assistants and Nurse Practitioners) who all work together to provide you with the care you need, when you need it.  We recommend signing up for the patient portal called "MyChart".  Sign up information is provided on this After Visit Summary.  MyChart is used to connect with patients for Virtual Visits (Telemedicine).  Patients are able to view lab/test results, encounter notes, upcoming appointments, etc.  Non-urgent messages can be sent to your provider as well.   To learn more about what you can do with MyChart, go to [email or web address].    Your next appointment:   Keep your post procedure follow up on   Other Instructions  [facility] A DEPT OF [facility] AT [facility] [address] [place] [address] Dept: [phone] Loc: [phone]  Harold [name]  10/29/2022  You are scheduled for a Peripheral Angiogram on Wednesday, September 18 with Dr. Lorine Bears.  1. Please arrive at the [facility] (Main Entrance A) at [facility]: [address] [place], [place] [address] at 6:00 AM (This time is 4 hour(s) before your procedure to ensure your preparation). Free valet  parking service is available. You will check in at ADMITTING. The support person will be asked to wait in the waiting room.  It is OK to have someone drop you off and come back when you are ready to be discharged.    Special note: Every effort is made to have your procedure done on time. Please understand that emergencies sometimes delay scheduled procedures.  2. Diet: Do not eat solid foods after midnight.  The patient may have clear liquids until 5am upon the day of the procedure.  3. Labs: You will need to have blood drawn on 10/29/22. You do not need to be fasting.  4. Medication instructions in preparation for your procedure: Hold the Eliquis two days prior to the procedure and the morning of (hold 9/16 and 9/17). Hold the Furosemide the morning of the procedure Hold all diabetic medication the morning of the procedure  On the morning of your procedure, take your Aspirin 81 mg and any morning medicines NOT listed above.  You may use sips of water.  5. Plan to go home the same day, you will only stay overnight if medically necessary. 6. Bring a current list of your medications and current insurance cards. 7. You MUST have a responsible person to drive you home. 8. Someone MUST be with you the first 24 hours after you arrive home or your discharge will be delayed. 9. Please wear clothes that are easy to get on and off and wear slip-on shoes.  Thank you for allowing [place] to care for you!   -- [place] [facility]

## 2022-10-30 LAB — BASIC METABOLIC PANEL
BUN/Creatinine Ratio: 12 (ref 10–24)
BUN: 23 mg/dL (ref 8–27)
CO2: 28 mmol/L (ref 20–29)
Calcium: 9.1 mg/dL (ref 8.6–10.2)
Chloride: 99 mmol/L (ref 96–106)
Creatinine, Ser: 1.89 mg/dL — ABNORMAL HIGH (ref 0.76–1.27)
Glucose: 214 mg/dL — ABNORMAL HIGH (ref 70–99)
Potassium: 5.2 mmol/L (ref 3.5–5.2)
Sodium: 140 mmol/L (ref 134–144)
eGFR: 38 mL/min/{1.73_m2} — ABNORMAL LOW (ref >59)

## 2022-10-30 LAB — CBC
Hematocrit: 45.6 % (ref 37.5–51.0)
Hemoglobin: 15.1 g/dL (ref 13.0–17.7)
MCH: 31.7 pg (ref 26.6–33.0)
MCHC: 33.1 g/dL (ref 31.5–35.7)
MCV: 96 fL (ref 79–97)
Platelets: 169 10*3/uL (ref 150–450)
RBC: 4.77 x10E6/uL (ref 4.14–5.80)
RDW: 14.5 % (ref 11.6–15.4)
WBC: 6.7 10*3/uL (ref 3.4–10.8)

## 2022-10-31 DIAGNOSIS — Z794 Long term (current) use of insulin: Secondary | ICD-10-CM | POA: Diagnosis not present

## 2022-10-31 DIAGNOSIS — I2510 Atherosclerotic heart disease of native coronary artery without angina pectoris: Secondary | ICD-10-CM | POA: Diagnosis not present

## 2022-10-31 DIAGNOSIS — E039 Hypothyroidism, unspecified: Secondary | ICD-10-CM | POA: Diagnosis not present

## 2022-10-31 DIAGNOSIS — E785 Hyperlipidemia, unspecified: Secondary | ICD-10-CM | POA: Diagnosis not present

## 2022-10-31 DIAGNOSIS — I482 Chronic atrial fibrillation, unspecified: Secondary | ICD-10-CM | POA: Diagnosis not present

## 2022-10-31 DIAGNOSIS — I10 Essential (primary) hypertension: Secondary | ICD-10-CM | POA: Diagnosis not present

## 2022-10-31 DIAGNOSIS — E1140 Type 2 diabetes mellitus with diabetic neuropathy, unspecified: Secondary | ICD-10-CM | POA: Diagnosis not present

## 2022-10-31 DIAGNOSIS — I951 Orthostatic hypotension: Secondary | ICD-10-CM | POA: Diagnosis not present

## 2022-10-31 DIAGNOSIS — I5032 Chronic diastolic (congestive) heart failure: Secondary | ICD-10-CM | POA: Diagnosis not present

## 2022-11-04 ENCOUNTER — Telehealth: Payer: Self-pay | Admitting: Cardiology

## 2022-11-04 DIAGNOSIS — M6281 Muscle weakness (generalized): Secondary | ICD-10-CM | POA: Diagnosis not present

## 2022-11-04 DIAGNOSIS — R2689 Other abnormalities of gait and mobility: Secondary | ICD-10-CM | POA: Diagnosis not present

## 2022-11-04 DIAGNOSIS — R262 Difficulty in walking, not elsewhere classified: Secondary | ICD-10-CM | POA: Diagnosis not present

## 2022-11-04 DIAGNOSIS — H8110 Benign paroxysmal vertigo, unspecified ear: Secondary | ICD-10-CM | POA: Diagnosis not present

## 2022-11-04 NOTE — Telephone Encounter (Signed)
Patient is calling stating he is returning a call from the office he received 09/13 at 2:47 pm. Please advise.

## 2022-11-04 NOTE — Telephone Encounter (Signed)
LM for patient that we do not see any outgoing call for the timeframe or since.

## 2022-11-04 NOTE — Telephone Encounter (Signed)
Left message for pt to call back  °

## 2022-11-05 ENCOUNTER — Ambulatory Visit (HOSPITAL_COMMUNITY)
Admission: RE | Admit: 2022-11-05 | Discharge: 2022-11-05 | Disposition: A | Discharge status: Home / Self Care | Payer: Medicare Other | Source: Ambulatory Visit | Attending: Physician Assistant | Admitting: Physician Assistant

## 2022-11-05 ENCOUNTER — Telehealth: Payer: Self-pay | Admitting: *Deleted

## 2022-11-05 VITALS — BP 160/78 | HR 67 | Ht 73.0 in | Wt 325.6 lb

## 2022-11-05 DIAGNOSIS — Z6841 Body Mass Index (BMI) 40.0 and over, adult: Secondary | ICD-10-CM | POA: Diagnosis not present

## 2022-11-05 DIAGNOSIS — Z7901 Long term (current) use of anticoagulants: Secondary | ICD-10-CM | POA: Diagnosis not present

## 2022-11-05 DIAGNOSIS — I2510 Atherosclerotic heart disease of native coronary artery without angina pectoris: Secondary | ICD-10-CM | POA: Insufficient documentation

## 2022-11-05 DIAGNOSIS — D6869 Other thrombophilia: Secondary | ICD-10-CM | POA: Insufficient documentation

## 2022-11-05 DIAGNOSIS — E669 Obesity, unspecified: Secondary | ICD-10-CM | POA: Diagnosis not present

## 2022-11-05 DIAGNOSIS — I129 Hypertensive chronic kidney disease with stage 1 through stage 4 chronic kidney disease, or unspecified chronic kidney disease: Secondary | ICD-10-CM | POA: Diagnosis not present

## 2022-11-05 DIAGNOSIS — I4819 Other persistent atrial fibrillation: Secondary | ICD-10-CM | POA: Insufficient documentation

## 2022-11-05 DIAGNOSIS — N189 Chronic kidney disease, unspecified: Secondary | ICD-10-CM | POA: Diagnosis not present

## 2022-11-05 DIAGNOSIS — Z5181 Encounter for therapeutic drug level monitoring: Secondary | ICD-10-CM

## 2022-11-05 DIAGNOSIS — G4733 Obstructive sleep apnea (adult) (pediatric): Secondary | ICD-10-CM | POA: Diagnosis not present

## 2022-11-05 DIAGNOSIS — I951 Orthostatic hypotension: Secondary | ICD-10-CM | POA: Diagnosis not present

## 2022-11-05 DIAGNOSIS — Z79899 Other long term (current) drug therapy: Secondary | ICD-10-CM | POA: Insufficient documentation

## 2022-11-05 NOTE — Telephone Encounter (Signed)
Abdominal aortogram  scheduled at [facility] for: Wednesday November 06, 2022 10:30 AM Arrival time [facility] Main Entrance A at: 6 AM-pre-procedure hydration  Nothing to eat after midnight prior to procedure, clear liquids until 5 AM day of procedure.  Medication instructions: -Hold:  Eliquis-pt reports none 11/04/22 and will hold until post procedure  Insulin/Tresiba-AM of procedure/1/2 usual dose Insulin HS prior to procedure  Losartan/Lasix/KCl-day before and day of procedure-per protocol GFR <60 -Other usual morning medications can be taken with sips of water including aspirin 81 mg.  Plan to go home the same day, you will only stay overnight if medically necessary.  You must have responsible adult to drive you home.  Someone must be with you the first 24 hours after you arrive home.

## 2022-11-05 NOTE — Progress Notes (Signed)
Primary Care Physician: [name], MD Primary Cardiologist: [name], MD Electrophysiologist: [name], MD  Referring Physician: Dr Ewell Poe is a 67 y.o. male with a history of carotid disease, obstructive sleep apnea on CPAP, peripheral arterial disease, essential hypertension, obesity, chronic kidney disease, type 2 diabetes, CAD, hyperlipidemia, atrial fibrillation who presents for follow up in the [facility]. The patient was initially diagnosed with atrial fibrillation 01/28/22 at his visit with Dr Herbie Baltimore. Patient had noticed more fatigue with exertion for the previous two months but was otherwise unaware of his arrhythmia. He was started on Eliquis for a CHADS2VASC score of 4. An echo and cardiac monitor were also ordered. He denies significant alcohol use and is compliant with his CPAP. The cardiac monitor showed 100% afib burden.   Patient is s/p DCCV on 03/13/22. Patient reports that after his DCCV he felt less SOB. He was back out of rhythm on follow up 03/27/22. Seen by Dr Nelly Laurence to discuss rhythm control options who recommended medical therapy. He was able to wean off Abilify and started on amiodarone 06/24/22. He is s/p repeat DCCV on 07/25/22.  On follow up today, patient reports that he has done well from an afib standpoint with no interim symptoms. No bleeding issues on anticoagulation. He is scheduled for an abdominal aortogram with lower extremity angiography tomorrow with Dr Kirke Corin.   Today, he denies symptoms of palpitations, chest pain, shortness of breath, orthopnea, PND, lower extremity edema, dizziness, presyncope, syncope, snoring, daytime somnolence, bleeding, or neurologic sequela. The patient is tolerating medications without difficulties and is otherwise without complaint today.   ROS- All systems are reviewed and negative except as per the HPI above.   Atrial Fibrillation Risk Factors:  he does have  symptoms or diagnosis of sleep apnea. he is compliant with CPAP therapy. he does not have a history of rheumatic fever. he does not have a history of alcohol use.   Atrial Fibrillation Management history:  Previous antiarrhythmic drugs: amiodarone Previous cardioversions: 03/13/22 Previous ablations: none Anticoagulation history: Eliquis   Physical Exam: BP (!) 160/78   Pulse 67   Ht 6\' 1"  (1.854 m)   Wt (!) 147.7 kg   BMI 42.96 kg/m   GEN: Well nourished, well developed in no acute distress NECK: No JVD; No carotid bruits CARDIAC: Regular rate and rhythm, no murmurs, rubs, gallops RESPIRATORY:  Clear to auscultation without rales, wheezing or rhonchi  ABDOMEN: Soft, non-tender, non-distended EXTREMITIES:  No edema; No deformity    EKG today demonstrates  SR, NST Vent. rate 67 BPM PR interval 180 ms QRS duration 104 ms QT/QTcB 430/454 ms  Echo 02/21/22 demonstrated   1. EF challenging to assess with afib/ challenging windows. Left  ventricular ejection fraction, by estimation, is 50 to 55%. The left  ventricle has low normal function. The left ventricle demonstrates global  hypokinesis. There is mild concentric left ventricular hypertrophy. Left ventricular diastolic parameters are indeterminate.   2. Right ventricular systolic function is normal. The right ventricular  size is normal.   3. Left atrial size was mildly dilated.   4. Trivial mitral valve regurgitation.   5. There is mild calcification of the aortic valve. Aortic valve  regurgitation is not visualized.   6. There is mild dilatation of the ascending aorta, measuring 36 mm.   7. The inferior vena cava is normal in size with greater than 50%  respiratory variability, suggesting right atrial pressure  of 3 mmHg.   CHA2DS2-VASc Score = 4  The patient's score is based upon: CHF History: 0 HTN History: 1 Diabetes History: 1 Stroke History: 0 Vascular Disease History: 1 Age Score: 1 Gender Score: 0        ASSESSMENT AND PLAN: Persistent Atrial Fibrillation (ICD10:  I48.19) The patient's CHA2DS2-VASc score is 4, indicating a 4.8% annual risk of stroke.   S/p DCCV 07/25/22 Patient appears to be maintaining SR.  Continue amiodarone 200 mg daily. May consider decreasing to 100 mg daily if he has no interim episodes.  Check cmet/TSH at follow up.  Continue carvedilol 12.5 mg BID Continue Eliquis 5 mg BID, currently on hold for vascular procedure tomorrow.   Secondary Hypercoagulable State (ICD10:  D68.69) The patient is at significant risk for stroke/thromboembolism based upon his CHA2DS2-VASc Score of 4.  Continue Apixaban (Eliquis). Not felt to be a candidate for Watchman (10/18/22 visit).  Obesity Body mass index is 42.96 kg/m.  Encouraged lifestyle modification  OSA  Encouraged nightly CPAP  HTN Elevated today, better controlled at home. Has had issues recently with orthostatic hypotension.   CAD S/p several stents No anginal symptoms  PAD Followed by Dr Kirke Corin Scheduled for an abdominal aortogram with lower extremity angiography tomorrow.    Follow up in the AF clinic in 6 months.    Jorja Loa PA-C [facility] [address] [place], [place] [address] [phone]

## 2022-11-06 ENCOUNTER — Other Ambulatory Visit: Payer: Self-pay

## 2022-11-06 ENCOUNTER — Ambulatory Visit (HOSPITAL_COMMUNITY)
Admission: RE | Disposition: A | Discharge status: Home / Self Care | Payer: Self-pay | Source: Home / Self Care | Attending: Cardiovascular Disease

## 2022-11-06 ENCOUNTER — Ambulatory Visit (HOSPITAL_COMMUNITY)
Admission: RE | Admit: 2022-11-06 | Discharge: 2022-11-07 | Disposition: A | Discharge status: Home / Self Care | Payer: Medicare Other | Attending: Cardiovascular Disease | Admitting: Cardiovascular Disease

## 2022-11-06 DIAGNOSIS — E785 Hyperlipidemia, unspecified: Secondary | ICD-10-CM | POA: Insufficient documentation

## 2022-11-06 DIAGNOSIS — I70212 Atherosclerosis of native arteries of extremities with intermittent claudication, left leg: Secondary | ICD-10-CM

## 2022-11-06 DIAGNOSIS — N1832 Chronic kidney disease, stage 3b: Secondary | ICD-10-CM | POA: Insufficient documentation

## 2022-11-06 DIAGNOSIS — E1169 Type 2 diabetes mellitus with other specified complication: Secondary | ICD-10-CM | POA: Diagnosis present

## 2022-11-06 DIAGNOSIS — Z794 Long term (current) use of insulin: Secondary | ICD-10-CM | POA: Diagnosis not present

## 2022-11-06 DIAGNOSIS — I5032 Chronic diastolic (congestive) heart failure: Secondary | ICD-10-CM | POA: Insufficient documentation

## 2022-11-06 DIAGNOSIS — Z87891 Personal history of nicotine dependence: Secondary | ICD-10-CM | POA: Insufficient documentation

## 2022-11-06 DIAGNOSIS — I2510 Atherosclerotic heart disease of native coronary artery without angina pectoris: Secondary | ICD-10-CM | POA: Diagnosis not present

## 2022-11-06 DIAGNOSIS — I130 Hypertensive heart and chronic kidney disease with heart failure and stage 1 through stage 4 chronic kidney disease, or unspecified chronic kidney disease: Secondary | ICD-10-CM | POA: Insufficient documentation

## 2022-11-06 DIAGNOSIS — L97829 Non-pressure chronic ulcer of other part of left lower leg with unspecified severity: Secondary | ICD-10-CM | POA: Diagnosis not present

## 2022-11-06 DIAGNOSIS — I70248 Atherosclerosis of native arteries of left leg with ulceration of other part of lower left leg: Secondary | ICD-10-CM | POA: Diagnosis not present

## 2022-11-06 DIAGNOSIS — Z955 Presence of coronary angioplasty implant and graft: Secondary | ICD-10-CM | POA: Insufficient documentation

## 2022-11-06 DIAGNOSIS — I10 Essential (primary) hypertension: Secondary | ICD-10-CM | POA: Diagnosis present

## 2022-11-06 DIAGNOSIS — E1122 Type 2 diabetes mellitus with diabetic chronic kidney disease: Secondary | ICD-10-CM | POA: Diagnosis not present

## 2022-11-06 DIAGNOSIS — Z79899 Other long term (current) drug therapy: Secondary | ICD-10-CM | POA: Insufficient documentation

## 2022-11-06 DIAGNOSIS — E669 Obesity, unspecified: Secondary | ICD-10-CM | POA: Diagnosis not present

## 2022-11-06 DIAGNOSIS — Z7902 Long term (current) use of antithrombotics/antiplatelets: Secondary | ICD-10-CM | POA: Insufficient documentation

## 2022-11-06 DIAGNOSIS — E1151 Type 2 diabetes mellitus with diabetic peripheral angiopathy without gangrene: Secondary | ICD-10-CM | POA: Diagnosis not present

## 2022-11-06 DIAGNOSIS — I4819 Other persistent atrial fibrillation: Secondary | ICD-10-CM | POA: Diagnosis not present

## 2022-11-06 DIAGNOSIS — Z6841 Body Mass Index (BMI) 40.0 and over, adult: Secondary | ICD-10-CM | POA: Diagnosis not present

## 2022-11-06 DIAGNOSIS — G4733 Obstructive sleep apnea (adult) (pediatric): Secondary | ICD-10-CM | POA: Insufficient documentation

## 2022-11-06 DIAGNOSIS — I739 Peripheral vascular disease, unspecified: Secondary | ICD-10-CM | POA: Diagnosis present

## 2022-11-06 DIAGNOSIS — Z7901 Long term (current) use of anticoagulants: Secondary | ICD-10-CM | POA: Insufficient documentation

## 2022-11-06 DIAGNOSIS — Z7985 Long-term (current) use of injectable non-insulin antidiabetic drugs: Secondary | ICD-10-CM | POA: Diagnosis not present

## 2022-11-06 HISTORY — PX: ABDOMINAL AORTOGRAM W/LOWER EXTREMITY: CATH118223

## 2022-11-06 HISTORY — PX: PERIPHERAL VASCULAR ATHERECTOMY: CATH118256

## 2022-11-06 LAB — GLUCOSE, CAPILLARY
Glucose-Capillary: 299 mg/dL — ABNORMAL HIGH (ref 70–99)
Glucose-Capillary: 253 mg/dL — ABNORMAL HIGH (ref 70–99)
Glucose-Capillary: 255 mg/dL — ABNORMAL HIGH (ref 70–99)

## 2022-11-06 SURGERY — ABDOMINAL AORTOGRAM W/LOWER EXTREMITY
Anesthesia: LOCAL

## 2022-11-06 MED ORDER — LIDOCAINE HCL (PF) 1 % IJ SOLN
INTRAMUSCULAR | Status: DC | PRN
  Administered 2022-11-06: 15 mL via INTRADERMAL

## 2022-11-06 MED ORDER — LIDOCAINE HCL (PF) 1 % IJ SOLN
INTRAMUSCULAR | Status: AC
  Filled 2022-11-06: qty 30

## 2022-11-06 MED ORDER — SODIUM CHLORIDE 0.9% FLUSH: 3.0000 mL | INTRAVENOUS | Status: DC | PRN

## 2022-11-06 MED ORDER — HYDRALAZINE HCL 20 MG/ML IJ SOLN
5.0000 mg | INTRAMUSCULAR | Status: AC | PRN
  Administered 2022-11-06 – 2022-11-07 (×2): 5 mg via INTRAVENOUS
  Filled 2022-11-06 (×2): qty 1

## 2022-11-06 MED ORDER — FAMOTIDINE IN NACL 20-0.9 MG/50ML-% IV SOLN
INTRAVENOUS | Status: AC
  Administered 2022-11-06: 20 mg via INTRAVENOUS
  Filled 2022-11-06: qty 50

## 2022-11-06 MED ORDER — NITROGLYCERIN 0.4 MG SL SUBL: 0.4000 mg | SUBLINGUAL_TABLET | SUBLINGUAL | Status: DC | PRN

## 2022-11-06 MED ORDER — HYDRALAZINE HCL 20 MG/ML IJ SOLN
INTRAMUSCULAR | Status: DC | PRN
  Administered 2022-11-06 (×2): 10 mg via INTRAVENOUS

## 2022-11-06 MED ORDER — IODIXANOL 320 MG/ML IV SOLN
INTRAVENOUS | Status: DC | PRN
  Administered 2022-11-06: 70 mL via INTRA_ARTERIAL

## 2022-11-06 MED ORDER — CARVEDILOL 3.125 MG PO TABS
3.1250 mg | ORAL_TABLET | Freq: Two times a day (BID) | ORAL | Status: DC
  Administered 2022-11-06 – 2022-11-07 (×2): 3.125 mg via ORAL
  Filled 2022-11-06 (×2): qty 1

## 2022-11-06 MED ORDER — INSULIN DEGLUDEC 200 UNIT/ML ~~LOC~~ SOPN: PEN_INJECTOR | Freq: Every day | SUBCUTANEOUS | Status: DC

## 2022-11-06 MED ORDER — MIDAZOLAM HCL 2 MG/2ML IJ SOLN
INTRAMUSCULAR | Status: AC
  Filled 2022-11-06: qty 2

## 2022-11-06 MED ORDER — ONDANSETRON HCL 4 MG/2ML IJ SOLN
4.0000 mg | Freq: Four times a day (QID) | INTRAMUSCULAR | Status: DC | PRN
  Administered 2022-11-06: 4 mg via INTRAVENOUS
  Filled 2022-11-06: qty 2

## 2022-11-06 MED ORDER — FENTANYL CITRATE (PF) 100 MCG/2ML IJ SOLN
INTRAMUSCULAR | Status: AC
  Filled 2022-11-06: qty 2

## 2022-11-06 MED ORDER — MECLIZINE HCL 25 MG PO TABS
37.5000 mg | ORAL_TABLET | Freq: Two times a day (BID) | ORAL | Status: DC
  Administered 2022-11-06 – 2022-11-07 (×2): 37.5 mg via ORAL
  Filled 2022-11-06 (×3): qty 1.5

## 2022-11-06 MED ORDER — HEPARIN SODIUM (PORCINE) 1000 UNIT/ML IJ SOLN
INTRAMUSCULAR | Status: AC
  Filled 2022-11-06: qty 20

## 2022-11-06 MED ORDER — HEPARIN SODIUM (PORCINE) 1000 UNIT/ML IJ SOLN
INTRAMUSCULAR | Status: DC | PRN
  Administered 2022-11-06: 12000 [IU] via INTRAVENOUS

## 2022-11-06 MED ORDER — NITROGLYCERIN 1 MG/10 ML FOR IR/CATH LAB
INTRA_ARTERIAL | Status: DC | PRN
  Administered 2022-11-06 (×4): 200 ug via INTRA_ARTERIAL

## 2022-11-06 MED ORDER — SODIUM CHLORIDE 0.9 % IV SOLN: INTRAVENOUS | Status: AC

## 2022-11-06 MED ORDER — LEVOTHYROXINE SODIUM 88 MCG PO TABS
88.0000 ug | ORAL_TABLET | Freq: Every day | ORAL | Status: DC
  Administered 2022-11-07: 88 ug via ORAL
  Filled 2022-11-06: qty 1

## 2022-11-06 MED ORDER — FENTANYL CITRATE (PF) 100 MCG/2ML IJ SOLN
INTRAMUSCULAR | Status: DC | PRN
  Administered 2022-11-06 (×3): 50 ug via INTRAVENOUS

## 2022-11-06 MED ORDER — EZETIMIBE 10 MG PO TABS
10.0000 mg | ORAL_TABLET | Freq: Every day | ORAL | Status: DC
  Administered 2022-11-06 – 2022-11-07 (×2): 10 mg via ORAL
  Filled 2022-11-06 (×2): qty 1

## 2022-11-06 MED ORDER — NITROGLYCERIN IN D5W 200-5 MCG/ML-% IV SOLN
INTRAVENOUS | Status: AC
  Filled 2022-11-06: qty 250

## 2022-11-06 MED ORDER — INSULIN GLARGINE-YFGN 100 UNIT/ML ~~LOC~~ SOLN
40.0000 [IU] | Freq: Every day | SUBCUTANEOUS | Status: DC
  Administered 2022-11-06: 40 [IU] via SUBCUTANEOUS
  Filled 2022-11-06 (×2): qty 0.4

## 2022-11-06 MED ORDER — ASPIRIN 81 MG PO CHEW: 81.0000 mg | CHEWABLE_TABLET | ORAL | Status: DC

## 2022-11-06 MED ORDER — MIDAZOLAM HCL 2 MG/2ML IJ SOLN
INTRAMUSCULAR | Status: DC | PRN
  Administered 2022-11-06 (×3): 1 mg via INTRAVENOUS

## 2022-11-06 MED ORDER — CLOPIDOGREL BISULFATE 300 MG PO TABS
ORAL_TABLET | ORAL | Status: AC
  Filled 2022-11-06: qty 1

## 2022-11-06 MED ORDER — INSULIN REGULAR HUMAN (CONC) 500 UNIT/ML ~~LOC~~ SOPN: 180.0000 [IU] | PEN_INJECTOR | Freq: Every day | SUBCUTANEOUS | Status: DC

## 2022-11-06 MED ORDER — CLOPIDOGREL BISULFATE 300 MG PO TABS
ORAL_TABLET | ORAL | Status: DC | PRN
  Administered 2022-11-06: 300 mg via ORAL

## 2022-11-06 MED ORDER — HYDRALAZINE HCL 20 MG/ML IJ SOLN
INTRAMUSCULAR | Status: AC
  Filled 2022-11-06: qty 1

## 2022-11-06 MED ORDER — ASPIRIN 81 MG PO CHEW
81.0000 mg | CHEWABLE_TABLET | ORAL | Status: AC
  Administered 2022-11-06: 81 mg via ORAL
  Filled 2022-11-06: qty 1

## 2022-11-06 MED ORDER — ALLOPURINOL 100 MG PO TABS
200.0000 mg | ORAL_TABLET | Freq: Two times a day (BID) | ORAL | Status: DC
  Administered 2022-11-06 – 2022-11-07 (×2): 200 mg via ORAL
  Filled 2022-11-06 (×2): qty 2

## 2022-11-06 MED ORDER — CLOPIDOGREL BISULFATE 75 MG PO TABS
75.0000 mg | ORAL_TABLET | Freq: Every day | ORAL | Status: DC
  Administered 2022-11-07: 75 mg via ORAL
  Filled 2022-11-06: qty 1

## 2022-11-06 MED ORDER — PANTOPRAZOLE SODIUM 40 MG PO TBEC
40.0000 mg | DELAYED_RELEASE_TABLET | Freq: Every day | ORAL | Status: DC
  Administered 2022-11-06 – 2022-11-07 (×2): 40 mg via ORAL
  Filled 2022-11-06 (×2): qty 1

## 2022-11-06 MED ORDER — SODIUM CHLORIDE 0.9% FLUSH: 3.0000 mL | Freq: Two times a day (BID) | INTRAVENOUS | Status: DC

## 2022-11-06 MED ORDER — NITROGLYCERIN 1 MG/10 ML FOR IR/CATH LAB
INTRA_ARTERIAL | Status: AC
  Filled 2022-11-06: qty 10

## 2022-11-06 MED ORDER — INSULIN REGULAR HUMAN (CONC) 500 UNIT/ML ~~LOC~~ SOPN: 90.0000 [IU] | PEN_INJECTOR | SUBCUTANEOUS | Status: DC

## 2022-11-06 MED ORDER — TIRZEPATIDE 15 MG/0.5ML ~~LOC~~ SOAJ: 15.0000 mg | SUBCUTANEOUS | Status: DC

## 2022-11-06 MED ORDER — COLCHICINE 0.6 MG PO TABS
0.6000 mg | ORAL_TABLET | Freq: Every day | ORAL | Status: DC
  Administered 2022-11-07: 0.6 mg via ORAL
  Filled 2022-11-06: qty 1

## 2022-11-06 MED ORDER — INSULIN ASPART 100 UNIT/ML IJ SOLN: 0.0000 [IU] | Freq: Three times a day (TID) | INTRAMUSCULAR | Status: DC

## 2022-11-06 MED ORDER — INSULIN REGULAR HUMAN (CONC) 500 UNIT/ML ~~LOC~~ SOPN
90.0000 [IU] | PEN_INJECTOR | Freq: Every day | SUBCUTANEOUS | Status: DC
  Filled 2022-11-06: qty 3

## 2022-11-06 MED ORDER — INSULIN REGULAR HUMAN (CONC) 500 UNIT/ML ~~LOC~~ SOPN
60.0000 [IU] | PEN_INJECTOR | Freq: Every day | SUBCUTANEOUS | Status: DC
  Administered 2022-11-06: 60 [IU] via SUBCUTANEOUS

## 2022-11-06 MED ORDER — FAMOTIDINE IN NACL 20-0.9 MG/50ML-% IV SOLN: 20.0000 mg | Freq: Once | INTRAVENOUS | Status: DC

## 2022-11-06 MED ORDER — HEPARIN (PORCINE) IN NACL 1000-0.9 UT/500ML-% IV SOLN
INTRAVENOUS | Status: DC | PRN
  Administered 2022-11-06: 500 mL

## 2022-11-06 MED ORDER — SODIUM CHLORIDE 0.9 % IV SOLN: INTRAVENOUS | Status: DC

## 2022-11-06 MED ORDER — LOSARTAN POTASSIUM 25 MG PO TABS
12.5000 mg | ORAL_TABLET | Freq: Every day | ORAL | Status: DC
  Administered 2022-11-06 – 2022-11-07 (×2): 12.5 mg via ORAL
  Filled 2022-11-06 (×2): qty 1

## 2022-11-06 MED ORDER — GABAPENTIN 400 MG PO CAPS
1600.0000 mg | ORAL_CAPSULE | Freq: Two times a day (BID) | ORAL | Status: DC
  Administered 2022-11-06 – 2022-11-07 (×2): 1600 mg via ORAL
  Filled 2022-11-06 (×5): qty 4

## 2022-11-06 MED ORDER — SODIUM CHLORIDE 0.9 % IV SOLN: 250.0000 mL | INTRAVENOUS | Status: DC | PRN

## 2022-11-06 MED ORDER — INSULIN ASPART 100 UNIT/ML IJ SOLN: 0.0000 [IU] | Freq: Every day | INTRAMUSCULAR | Status: DC

## 2022-11-06 MED ORDER — TIRZEPATIDE 12.5 MG/0.5ML ~~LOC~~ SOAJ: 12.5000 mg | SUBCUTANEOUS | Status: DC

## 2022-11-06 MED ORDER — ACETAMINOPHEN 325 MG PO TABS
650.0000 mg | ORAL_TABLET | ORAL | Status: DC | PRN
  Administered 2022-11-07: 650 mg via ORAL
  Filled 2022-11-06: qty 2

## 2022-11-06 MED ORDER — AMIODARONE HCL 200 MG PO TABS
200.0000 mg | ORAL_TABLET | Freq: Every day | ORAL | Status: DC
  Administered 2022-11-06 – 2022-11-07 (×2): 200 mg via ORAL
  Filled 2022-11-06 (×2): qty 1

## 2022-11-06 MED ORDER — ROSUVASTATIN CALCIUM 20 MG PO TABS
20.0000 mg | ORAL_TABLET | Freq: Every evening | ORAL | Status: DC
  Administered 2022-11-06: 20 mg via ORAL
  Filled 2022-11-06: qty 1

## 2022-11-06 MED ORDER — ARIPIPRAZOLE 5 MG PO TABS
5.0000 mg | ORAL_TABLET | Freq: Every day | ORAL | Status: DC
  Administered 2022-11-06 – 2022-11-07 (×2): 5 mg via ORAL
  Filled 2022-11-06 (×2): qty 1

## 2022-11-06 MED ORDER — VIPERSLIDE LUBRICANT OPTIME: TOPICAL | Status: DC | PRN

## 2022-11-06 MED ORDER — DULOXETINE HCL 60 MG PO CPEP
60.0000 mg | ORAL_CAPSULE | Freq: Every day | ORAL | Status: DC
  Administered 2022-11-07: 60 mg via ORAL
  Filled 2022-11-06: qty 1

## 2022-11-06 MED ORDER — ONDANSETRON HCL 4 MG/2ML IJ SOLN
INTRAMUSCULAR | Status: AC
  Administered 2022-11-06: 4 mg via INTRAVENOUS
  Filled 2022-11-06: qty 2

## 2022-11-06 MED ORDER — INSULIN REGULAR HUMAN (CONC) 500 UNIT/ML ~~LOC~~ SOPN
60.0000 [IU] | PEN_INJECTOR | Freq: Every day | SUBCUTANEOUS | Status: DC
  Filled 2022-11-06: qty 3

## 2022-11-06 SURGICAL SUPPLY — 31 items
BAG SNAP BAND KOVER 36X36 (MISCELLANEOUS) IMPLANT
BALLN COYOTE OTW 3.5X60X150 (BALLOONS) ×2
BALLOON COYOTE OTW 3.5X60X150 (BALLOONS) IMPLANT
CATH CROSS OVER TEMPO 5F (CATHETERS) IMPLANT
CATH OMNI FLUSH 5F 65CM (CATHETERS) IMPLANT
CATH STRAIGHT 5FR 65CM (CATHETERS) IMPLANT
CATH SYNTRAX .014X135 (CATHETERS) IMPLANT
CROWN STEALTH MICRO-30 1.25MM (CATHETERS) IMPLANT
DCB RANGER 4.0X150 150 (BALLOONS) IMPLANT
DEVICE CLOSURE MYNXGRIP 6/7F (Vascular Products) IMPLANT
GLIDEWIRE ADV .035X260CM (WIRE) IMPLANT
KIT ENCORE 26 ADVANTAGE (KITS) IMPLANT
KIT MICROPUNCTURE NIT STIFF (SHEATH) IMPLANT
KIT PV (KITS) ×2 IMPLANT
KIT SYRINGE INJ CVI SPIKEX1 (MISCELLANEOUS) IMPLANT
LUBRICANT VIPERSLIDE CORONARY (MISCELLANEOUS) IMPLANT
RANGER DCB 4.0X150 150 (BALLOONS) ×2
SET ATX-X65L (MISCELLANEOUS) IMPLANT
SHEATH CATAPULT 4F 90 ST (SHEATH) IMPLANT
SHEATH CATAPULT 6FR 60 (SHEATH) IMPLANT
SHEATH PINNACLE 5F 10CM (SHEATH) IMPLANT
SHEATH PINNACLE 6F 10CM (SHEATH) IMPLANT
SHEATH PROBE COVER 6X72 (BAG) IMPLANT
TAPE SHOOT N SEE (TAPE) IMPLANT
TRANSDUCER W/STOPCOCK (MISCELLANEOUS) ×2 IMPLANT
TRAY PV CATH (CUSTOM PROCEDURE TRAY) ×2 IMPLANT
WIRE AMPLATZ SS-J .035X180CM (WIRE) IMPLANT
WIRE BENTSON .035X145CM (WIRE) IMPLANT
WIRE RUNTHROUGH IZANAI 014 300 (WIRE) IMPLANT
WIRE VIPER ADVANCE .017X335CM (WIRE) IMPLANT
WIRE VIPERWIRE COR FLEX .012 (WIRE) IMPLANT

## 2022-11-06 NOTE — Progress Notes (Signed)
Patient brought to 4E from PACU. Telemetry box applied, CCMD notified. Patient oriented to room and staff. Call bell in reach.     11/06/22 1438  Vitals  Temp (!) 97.5 F (36.4 C)  Temp Source Oral  BP (!) 177/77  MAP (mmHg) 106  BP Location Right Arm  BP Method Automatic  Patient Position (if appropriate) Lying  Pulse Rate 62  Pulse Rate Source Monitor  ECG Heart Rate 92  Resp 17  Level of Consciousness  Level of Consciousness Alert  MEWS COLOR  MEWS Score Color Green  Oxygen Therapy  SpO2 99 %  O2 Device Room Air  Height and Weight  Height 6\' 1"  (1.854 m)  Weight (!) 152.9 kg  Type of Scale Used Bed  Type of Weight Stated  BSA (Calculated - sq m) 2.81 sq meters  BMI (Calculated) 44.48  Weight in (lb) to have BMI = 25 189.1  MEWS Score  MEWS Temp 0  MEWS Systolic 0  MEWS Pulse 0  MEWS RR 0  MEWS LOC 0  MEWS Score 0

## 2022-11-06 NOTE — Inpatient Diabetes Management (Addendum)
Inpatient Diabetes Program Recommendations  AACE/ADA: New Consensus Statement on Inpatient Glycemic Control (2015)  Target Ranges:  Prepandial:   less than 140 mg/dL      Peak postprandial:   less than 180 mg/dL (1-2 hours)      Critically ill patients:  140 - 180 mg/dL   Lab Results  Component Value Date   GLUCAP 253 (H) 11/06/2022   HGBA1C 9.4 (H) 07/24/2020    Review of Glycemic Control  Latest Reference Range & Units 11/06/22 06:42 11/06/22 12:23  Glucose-Capillary 70 - 99 mg/dL 621 (H) 308 (H)   Diabetes history: DM 2 Outpatient Diabetes medications:  Tresiba 60 units q HS- verified with patient U500 90 units in the AM and U500 180 units q PM Mounjaro 15 mg weekly Tresiba 60 units daily Current orders for Inpatient glycemic control:  U500 90-180 units bid Moujaro 12.5 mg weekly Tresiba (no dose specified)  Inpatient Diabetes Program Recommendations:    Note that patient takes both U500 and [place] at home.  Verified doses with patient at bedside.  Recommend reducing U500 to 60 units bid (while in the hospital) and d/c Tresiba and add Semglee 40 units q HS.   Thanks,  Beryl Meager, RN, BC-ADM Inpatient Diabetes Coordinator Pager [phone]  (8a-5p)

## 2022-11-06 NOTE — Interval H&P Note (Signed)
History and Physical Interval Note:  11/06/2022 10:09 AM  Harold [name]  has presented today for surgery, with the diagnosis of pad.  The various methods of treatment have been discussed with the patient and family. After consideration of risks, benefits and other options for treatment, the patient has consented to  Procedure(s): ABDOMINAL AORTOGRAM W/LOWER EXTREMITY (N/A) as a surgical intervention.  The patient's history has been reviewed, patient examined, no change in status, stable for surgery.  I have reviewed the patient's chart and labs.  Questions were answered to the patient's satisfaction.     Lorine Bears

## 2022-11-07 ENCOUNTER — Encounter (HOSPITAL_COMMUNITY): Payer: Self-pay | Admitting: Cardiovascular Disease

## 2022-11-07 ENCOUNTER — Other Ambulatory Visit: Payer: Self-pay | Admitting: Physician Assistant

## 2022-11-07 ENCOUNTER — Other Ambulatory Visit: Payer: Self-pay | Admitting: *Deleted

## 2022-11-07 DIAGNOSIS — E785 Hyperlipidemia, unspecified: Secondary | ICD-10-CM | POA: Diagnosis not present

## 2022-11-07 DIAGNOSIS — I5032 Chronic diastolic (congestive) heart failure: Secondary | ICD-10-CM | POA: Diagnosis not present

## 2022-11-07 DIAGNOSIS — Z7902 Long term (current) use of antithrombotics/antiplatelets: Secondary | ICD-10-CM | POA: Diagnosis not present

## 2022-11-07 DIAGNOSIS — Z7901 Long term (current) use of anticoagulants: Secondary | ICD-10-CM | POA: Diagnosis not present

## 2022-11-07 DIAGNOSIS — N1832 Chronic kidney disease, stage 3b: Secondary | ICD-10-CM | POA: Diagnosis not present

## 2022-11-07 DIAGNOSIS — I2510 Atherosclerotic heart disease of native coronary artery without angina pectoris: Secondary | ICD-10-CM | POA: Diagnosis not present

## 2022-11-07 DIAGNOSIS — I130 Hypertensive heart and chronic kidney disease with heart failure and stage 1 through stage 4 chronic kidney disease, or unspecified chronic kidney disease: Secondary | ICD-10-CM | POA: Diagnosis not present

## 2022-11-07 DIAGNOSIS — E1122 Type 2 diabetes mellitus with diabetic chronic kidney disease: Secondary | ICD-10-CM | POA: Diagnosis not present

## 2022-11-07 DIAGNOSIS — Z955 Presence of coronary angioplasty implant and graft: Secondary | ICD-10-CM | POA: Diagnosis not present

## 2022-11-07 DIAGNOSIS — I739 Peripheral vascular disease, unspecified: Secondary | ICD-10-CM

## 2022-11-07 DIAGNOSIS — Z79899 Other long term (current) drug therapy: Secondary | ICD-10-CM | POA: Diagnosis not present

## 2022-11-07 DIAGNOSIS — Z7985 Long-term (current) use of injectable non-insulin antidiabetic drugs: Secondary | ICD-10-CM | POA: Diagnosis not present

## 2022-11-07 DIAGNOSIS — I4819 Other persistent atrial fibrillation: Secondary | ICD-10-CM | POA: Diagnosis not present

## 2022-11-07 DIAGNOSIS — Z87891 Personal history of nicotine dependence: Secondary | ICD-10-CM | POA: Diagnosis not present

## 2022-11-07 DIAGNOSIS — Z794 Long term (current) use of insulin: Secondary | ICD-10-CM | POA: Diagnosis not present

## 2022-11-07 DIAGNOSIS — G4733 Obstructive sleep apnea (adult) (pediatric): Secondary | ICD-10-CM | POA: Diagnosis not present

## 2022-11-07 DIAGNOSIS — L97829 Non-pressure chronic ulcer of other part of left lower leg with unspecified severity: Secondary | ICD-10-CM | POA: Diagnosis not present

## 2022-11-07 DIAGNOSIS — I70248 Atherosclerosis of native arteries of left leg with ulceration of other part of lower left leg: Secondary | ICD-10-CM | POA: Diagnosis not present

## 2022-11-07 DIAGNOSIS — E1151 Type 2 diabetes mellitus with diabetic peripheral angiopathy without gangrene: Secondary | ICD-10-CM | POA: Diagnosis not present

## 2022-11-07 LAB — BASIC METABOLIC PANEL
Anion gap: 14 (ref 5–15)
BUN: 18 mg/dL (ref 8–23)
CO2: 24 mmol/L (ref 22–32)
Calcium: 8.8 mg/dL — ABNORMAL LOW (ref 8.9–10.3)
Chloride: 104 mmol/L (ref 98–111)
Creatinine, Ser: 1.83 mg/dL — ABNORMAL HIGH (ref 0.61–1.24)
GFR, Estimated: 39 mL/min — ABNORMAL LOW (ref >60)
Glucose, Bld: 164 mg/dL — ABNORMAL HIGH (ref 70–99)
Potassium: 3.9 mmol/L (ref 3.5–5.1)
Sodium: 142 mmol/L (ref 135–145)

## 2022-11-07 LAB — GLUCOSE, CAPILLARY
Glucose-Capillary: 49 mg/dL — ABNORMAL LOW (ref 70–99)
Glucose-Capillary: 55 mg/dL — ABNORMAL LOW (ref 70–99)
Glucose-Capillary: 85 mg/dL (ref 70–99)
Glucose-Capillary: 122 mg/dL — ABNORMAL HIGH (ref 70–99)
Glucose-Capillary: 148 mg/dL — ABNORMAL HIGH (ref 70–99)

## 2022-11-07 LAB — LIPID PANEL
Cholesterol: 147 mg/dL (ref 0–200)
HDL: 57 mg/dL (ref >40)
LDL Cholesterol: 54 mg/dL (ref 0–99)
Total CHOL/HDL Ratio: 2.6 RATIO
Triglycerides: 181 mg/dL — ABNORMAL HIGH (ref <150)
VLDL: 36 mg/dL (ref 0–40)

## 2022-11-07 LAB — HEMOGLOBIN AND HEMATOCRIT, BLOOD
HCT: 39.7 % (ref 39.0–52.0)
Hemoglobin: 13.2 g/dL (ref 13.0–17.0)

## 2022-11-07 MED ORDER — FUROSEMIDE 40 MG PO TABS
80.0000 mg | ORAL_TABLET | Freq: Two times a day (BID) | ORAL | Status: DC
  Administered 2022-11-07: 80 mg via ORAL
  Filled 2022-11-07: qty 2

## 2022-11-07 MED ORDER — TRESIBA FLEXTOUCH 200 UNIT/ML ~~LOC~~ SOPN: 40.0000 [IU] | PEN_INJECTOR | Freq: Every day | SUBCUTANEOUS | Status: DC

## 2022-11-07 MED ORDER — HUMULIN R U-500 KWIKPEN 500 UNIT/ML ~~LOC~~ SOPN: 60.0000 [IU] | PEN_INJECTOR | Freq: Two times a day (BID) | SUBCUTANEOUS | Status: DC

## 2022-11-07 MED ORDER — POTASSIUM CHLORIDE CRYS ER 10 MEQ PO TBCR
10.0000 meq | EXTENDED_RELEASE_TABLET | Freq: Every day | ORAL | Status: DC
  Administered 2022-11-07: 10 meq via ORAL
  Filled 2022-11-07: qty 1

## 2022-11-07 MED ORDER — CARVEDILOL 3.125 MG PO TABS: 3.1250 mg | ORAL_TABLET | Freq: Two times a day (BID) | ORAL | 2 refills | Status: DC

## 2022-11-07 MED ORDER — CLOPIDOGREL BISULFATE 75 MG PO TABS: 75.0000 mg | ORAL_TABLET | Freq: Every day | ORAL | 5 refills | Status: DC

## 2022-11-07 MED ORDER — PANTOPRAZOLE SODIUM 40 MG PO TBEC: 40.0000 mg | DELAYED_RELEASE_TABLET | Freq: Every day | ORAL | 2 refills | Status: DC

## 2022-11-07 MED ORDER — APIXABAN 5 MG PO TABS
5.0000 mg | ORAL_TABLET | Freq: Two times a day (BID) | ORAL | Status: DC
  Administered 2022-11-07: 5 mg via ORAL
  Filled 2022-11-07: qty 1

## 2022-11-07 MED FILL — Nitroglycerin IV Soln 200 MCG/ML in D5W: INTRAVENOUS | Qty: 250 | Status: AC

## 2022-11-07 NOTE — Progress Notes (Signed)
Mobility Specialist Progress Note:   11/07/22 1109  Mobility  Activity Ambulated with assistance in hallway  Level of Assistance Contact guard assist, steadying assist  Assistive Device Front wheel walker  Distance Ambulated (ft) 30 ft  Activity Response Tolerated fair  Mobility Referral Yes  $Mobility charge 1 Mobility  Mobility Specialist Start Time (ACUTE ONLY) 0935  Mobility Specialist Stop Time (ACUTE ONLY) 0947  Mobility Specialist Time Calculation (min) (ACUTE ONLY) 12 min   Pre Mobility: 68 HR , During Mobility: 77 HR Post Mobility: 68 HR   Pt received in bed, agreeable to mobility. MinA bed mobility. CG during ambulation. Pt able to take a couple steps in hallway but c/o of leg weakness displaying slight knee buckle requiring seated rest break. Pt denied any dizziness or lightheadedness during session. Pt returned to bed asymptomatic with call bell in reach and all needs met.   Leory Plowman  Mobility Specialist Please contact via [employer] office at [phone]

## 2022-11-07 NOTE — Progress Notes (Signed)
Hypoglycemic Event  CBG: 49   Treatment: 8 oz juice/soda  Symptoms: None  Follow-up CBG: Time:0836 CBG Result:55 Treatment :8 OZ juice Follow up CBG : Time 0906 CBG :85  Possible Reasons for Event: Inadequate meal intake  Comments/MD notified:no    Harold [name]

## 2022-11-07 NOTE — Progress Notes (Addendum)
Progress Note  Patient Name: Harold [name] Date of Encounter: 11/07/2022  Primary Cardiologist: [name], MD  Subjective   Had low CBG this AM of 49, felt a little out of it with this. Appetite has not been great so was not eating yesterday and this AM. Repeat CBG 85. Reports no concerns with home blood sugars on home regimen. No complaints of CP, SOB or groin issues. Feeling well enough to go home.  Inpatient Medications    Scheduled Meds:  allopurinol  200 mg Oral BID   amiodarone  200 mg Oral Daily   ARIPiprazole  5 mg Oral Daily   carvedilol  3.125 mg Oral BID WC   clopidogrel  75 mg Oral Q breakfast   colchicine  0.6 mg Oral Daily   DULoxetine  60 mg Oral Daily   ezetimibe  10 mg Oral Daily   gabapentin  1,600 mg Oral BID   insulin glargine-yfgn  40 Units Subcutaneous QHS   insulin regular human CONCENTRATED  60 Units Subcutaneous Q breakfast   And   insulin regular human CONCENTRATED  60 Units Subcutaneous QHS   levothyroxine  88 mcg Oral QAC breakfast   losartan  12.5 mg Oral Daily   meclizine  37.5 mg Oral BID WC   pantoprazole  40 mg Oral Daily   rosuvastatin  20 mg Oral QPM   sodium chloride flush  3 mL Intravenous Q12H   Continuous Infusions:  sodium chloride     PRN Meds: sodium chloride, acetaminophen, nitroGLYCERIN, ondansetron (ZOFRAN) IV, sodium chloride flush   Vital Signs    Vitals:   11/07/22 0004 11/07/22 0033 11/07/22 0440 11/07/22 0806  BP: (!) 184/78 (!) 168/70 134/61 (!) 145/69  Pulse:      Resp:  20 17   Temp:   97.9 F (36.6 C) (!) 96.9 F (36.1 C)  TempSrc:   Oral Axillary  SpO2:   96% 100%  Weight:      Height:       No intake or output data in the 24 hours ending 11/07/22 0859    11/06/2022    2:38 PM 11/06/2022    6:30 AM 11/05/2022    3:27 PM  Last 3 Weights  Weight (lbs) 337 lb 1.3 oz 325 lb 325 lb 9.6 oz  Weight (kg) 152.9 kg 147.419 kg 147.691 kg     Telemetry    NSR - Personally Reviewed  Physical Exam    GEN: No acute distress.  HEENT: Normocephalic, atraumatic, sclera non-icteric. Neck: No JVD or bruits. Cardiac: RRR no murmurs, rubs, or gallops.  Respiratory: Clear to auscultation bilaterally. Breathing is unlabored. GI: Soft, nontender, non-distended, BS +x 4. MS: no deformity. Extremities: No clubbing or cyanosis. No edema. Distal pedal pulses are 2+ and equal bilaterally. Right groin cath site without hematoma, ecchymosis, or bruit. Chronic venous stasis changes. Neuro:  AAOx3. Follows commands. Psych:  Responds to questions appropriately with a normal affect.  Labs    High Sensitivity Troponin:  No results for input(s): "TROPONINIHS" in the last 720 hours.    Cardiac EnzymesNo results for input(s): "TROPONINI" in the last 168 hours. No results for input(s): "TROPIPOC" in the last 168 hours.   Chemistry Recent Labs  Lab 11/07/22 0251  NA 142  K 3.9  CL 104  CO2 24  GLUCOSE 164*  BUN 18  CREATININE 1.83*  CALCIUM 8.8*  GFRNONAA 39*  ANIONGAP 14     HematologyNo results for input(s): "WBC", "RBC", "HGB", "  HCT", "MCV", "MCH", "MCHC", "RDW", "PLT" in the last 168 hours.  BNPNo results for input(s): "BNP", "PROBNP" in the last 168 hours.   DDimer No results for input(s): "DDIMER" in the last 168 hours.   Radiology    PERIPHERAL VASCULAR CATHETERIZATION  Result Date: 11/06/2022 1.  No significant aortoiliac disease. 2.  Left lower extremity: Mild distal SFA stenosis, severe calcified stenosis in the mid popliteal artery, severe calcified disease affecting the TP trunk into the bifurcation of the posterior tibial and peroneal arteries.  The anterior tibial artery is occluded proximally. 3.  Successful orbital atherectomy and drug-coated balloon angioplasty of the left proximal posterior tibial artery, TP trunk and popliteal arteries.  The flow to the peroneal artery was improved even though the peroneal artery itself was not treated. Recommendations: Due to chronic kidney  disease, will hydrate overnight and check renal function in the morning.  70 mL of contrast was used for the procedure. Eliquis can be resumed tomorrow morning as long as no bleeding issues from the right femoral access. Recommend treatment with Plavix without aspirin for 1 month.    Cardiac Studies   PV angio 11/06/22  1.  No significant aortoiliac disease. 2.  Left lower extremity: Mild distal SFA stenosis, severe calcified stenosis in the mid popliteal artery, severe calcified disease affecting the TP trunk into the bifurcation of the posterior tibial and peroneal arteries.  The anterior tibial artery is occluded proximally. 3.  Successful orbital atherectomy and drug-coated balloon angioplasty of the left proximal posterior tibial artery, TP trunk and popliteal arteries.  The flow to the peroneal artery was improved even though the peroneal artery itself was not treated.   Recommendations: Due to chronic kidney disease, will hydrate overnight and check renal function in the morning.  70 mL of contrast was used for the procedure. Eliquis can be resumed tomorrow morning as long as no bleeding issues from the right femoral access. Recommend treatment with Plavix without aspirin for 1 month.    Patient Profile     67 y.o. male with PAD, carotid disease, obstructive sleep apnea on CPAP, peripheral arterial disease, essential hypertension with issues with orthostatic dizziness, obesity, chronic kidney disease, type 2 diabetes, persistent atrial fibrillation, chronic HFpEF, hyperlipidemia, chronic venous insufficiency with stasis dermatitis and chronic venous wounds followed at [facility]. He has been followed by [name] for his PAD. He has prior history of angioplasty to the right TP trunk and posterior tibial artery for severe claudication and chronic wounds in 2021.  He has had chronic venous wounds mostly now affecting the left side. He was known to have significant disease affecting the left  popliteal artery and tibial vessels. PV angiography was recommended.  Assessment & Plan    1. PAD - PV angio as above, successful orbital atherectomy and drug-coated balloon angioplasty of the left proximal posterior tibial artery, TP trunk and popliteal arteries  - now on Plavix - switch omeprazole to protonix given Plavix use - reviewed f/u plan with [name] - he indicates patient will get ABI and LEA within 2 to 3 weeks - he reports nurse Misty Stanley will take care of this and our hospital team does not need to order anything - has f/u with APP 12/06/22  2. CKD 3b - baseline Cr most recently 1.9-2.1 - stable post procedure  3. CAD - continue medical therapy, no acute concerns this admission  4. Essential HTN, chronic HFpEF - SBP this AM 130-140s - continue carvedilol, losartan -  restart Lasix today - follow in context above  5. Hyperlipidemia - continue Zetia, rosuvastatin - patient also noted to be on colchicine chronically, recommended he discuss long term plan for this medicine given interaction with his cholesterol medication   6. Persistent atrial fibrillation - maintained on amiodarone 200mg  daily - restarting Eliquis this AM  7. Diabetes mellitus - hold AM insulin in setting of poor oral intake, anticipate to continue home insulin regimen at discharge - addendum: per d/w DM coordinator, anticipate reducing Tresiba to 40 u daily and U-500 to 60 u BID with early PCP f/u  Will see how he feels this AM as he eats breakfast and holding AM insulin, likely DC today. We will get f/u H/H for completeness.  For questions or updates, please contact [facility] Please consult [email or web address] for contact info under Cardiology/STEMI.  Signed, Laurann Montana, PA-C 11/07/2022, 8:59 AM    Patient seen and examined with DD PA-C.  Agree as above, with the following exceptions and changes as noted below. Feels well. Gen: NAD, CV: RRR Lungs: no increased work of breathing, Abd:  soft, Extrem: trace edema, Neuro/Psych: alert and oriented x 3, normal mood and affect. All available labs, radiology testing, previous records reviewed. Stable for hospital dc today if ambulating well, h+h normal.   Parke Poisson, MD 11/07/22 2:37 PM

## 2022-11-07 NOTE — Inpatient Diabetes Management (Signed)
Inpatient Diabetes Program Recommendations  AACE/ADA: New Consensus Statement on Inpatient Glycemic Control (2015)  Target Ranges:  Prepandial:   less than 140 mg/dL      Peak postprandial:   less than 180 mg/dL (1-2 hours)      Critically ill patients:  140 - 180 mg/dL   Lab Results  Component Value Date   GLUCAP 85 11/07/2022   HGBA1C 9.4 (H) 07/24/2020    Review of Glycemic Control  Latest Reference Range & Units 11/06/22 12:23 11/06/22 20:52 11/07/22 08:08 11/07/22 08:36 11/07/22 09:06  Glucose-Capillary 70 - 99 mg/dL 829 (H) 562 (H) 49 (L) 55 (L) 85   Diabetes history: DM 2 Outpatient Diabetes medications:  Tresiba 60 units q HS- verified with patient U500 90 units in the AM and U500 180 units q PM Mounjaro 15 mg weekly  Current orders for Inpatient glycemic control:  Semglee 40 units q HS U500 60 units at 1800  Inpatient Diabetes Program Recommendations:    Note low blood sugar overnight.  Patient received Semglee 40 units last night plus U500 60 units at 2100.  Spoke with patient and he states he did not eat anything last night b/c he did not feel like it.   We discussed that U500 should be given with meals and may need adjustment if he does not eat/reduced intake.  He states he is used to reducing/adjusting dose.  He wears a CGM and states that his phone died so he did not have alarm overnight.  He does have lows at home but states the sensor "catches it" and he treats before he is too low.   Recommend reducing insulin doses at discharge and have patient f/u with PCP regarding dosing in order to avoid low blood sugars.  Discussed with PA and patient.   Thanks,  Beryl Meager, RN, BC-ADM Inpatient Diabetes Coordinator Pager [phone] (8a-5p)

## 2022-11-07 NOTE — Progress Notes (Signed)
Discharge instructions provided to patient. IV out. Monitor off. CCMD notified.

## 2022-11-07 NOTE — Inpatient Diabetes Management (Addendum)
  Spoke with patient by phone again and discussed reductions in insulins at d/c.  Patient states that he see's Sherrine Maples, Pharm D at Dr. Carolee Rota office.  Discussed importance of avoidance of hypoglycemia and need to f/u ASAP with PCP regarding insulin dose adjustment in order to prevent hypoglycemia.  Patient verbalized understanding.  He wears CGM and watches blood sugars closely at home.  Encouraged avoidance of hypoglycemia and close follow-up.    Thanks,  Beryl Meager, RN, BC-ADM Inpatient Diabetes Coordinator Pager [phone]  (8a-5p)

## 2022-11-07 NOTE — Plan of Care (Signed)
  Problem: Education: Goal: Understanding of CV disease, CV risk reduction, and recovery process will improve Outcome: Progressing Goal: Individualized Educational Video(s) Outcome: Progressing   Problem: Activity: Goal: Ability to return to baseline activity level will improve Outcome: Progressing   Problem: Cardiovascular: Goal: Ability to achieve and maintain adequate cardiovascular perfusion will improve Outcome: Progressing Goal: Vascular access site(s) Level 0-1 will be maintained Outcome: Progressing   Problem: Health Behavior/Discharge Planning: Goal: Ability to safely manage health-related needs after discharge will improve Outcome: Progressing   Problem: Education: Goal: Ability to describe self-care measures that may prevent or decrease complications (Diabetes Survival Skills Education) will improve Outcome: Progressing   Problem: Coping: Goal: Ability to adjust to condition or change in health will improve Outcome: Progressing   Problem: Fluid Volume: Goal: Ability to maintain a balanced intake and output will improve Outcome: Progressing   Problem: Health Behavior/Discharge Planning: Goal: Ability to identify and utilize available resources and services will improve Outcome: Progressing

## 2022-11-07 NOTE — Discharge Summary (Signed)
Discharge Summary    Patient ID: Harold [name] MRN: [id number]; DOB: August 07, 1955  Admit date: 11/06/2022 Discharge date: 11/07/2022  PCP:  [name], MD    HeartCare Providers Cardiologist:  [name], MD  Electrophysiologist:  [name], MD       Discharge Diagnoses    Principal Problem:   PAD (peripheral artery disease) [name]) Active Problems:   Essential hypertension   Hyperlipidemia associated with type 2 diabetes mellitus (HCC)   Chronic heart failure with preserved ejection fraction (HFpEF) (HCC)   Persistent atrial fibrillation (HCC)   CKD stage 3b, GFR 30-44 ml/min (HCC)   CAD in native artery    Diagnostic Studies/Procedures     PV angio 11/06/22  1.  No significant aortoiliac disease. 2.  Left lower extremity: Mild distal SFA stenosis, severe calcified stenosis in the mid popliteal artery, severe calcified disease affecting the TP trunk into the bifurcation of the posterior tibial and peroneal arteries.  The anterior tibial artery is occluded proximally. 3.  Successful orbital atherectomy and drug-coated balloon angioplasty of the left proximal posterior tibial artery, TP trunk and popliteal arteries.  The flow to the peroneal artery was improved even though the peroneal artery itself was not treated.   Recommendations: Due to chronic kidney disease, will hydrate overnight and check renal function in the morning.  70 mL of contrast was used for the procedure. Eliquis can be resumed tomorrow morning as long as no bleeding issues from the right femoral access. Recommend treatment with Plavix without aspirin for 1 month. _____________   History of Present Illness      67 y.o. male with PAD, carotid disease, obstructive sleep apnea on CPAP, peripheral arterial disease, essential hypertension with issues with orthostatic dizziness, obesity, chronic kidney disease, type 2 diabetes, persistent atrial fibrillation, chronic HFpEF,  hyperlipidemia, chronic venous insufficiency with stasis dermatitis and chronic venous wounds followed at [facility]. He has been followed by [name] for his PAD. He has prior history of angioplasty to the right TP trunk and posterior tibial artery for severe claudication and chronic wounds in 2021.  He has had chronic venous wounds mostly now affecting the left side. He was known to have significant disease affecting the left popliteal artery and tibial vessels. PV angiography was recommended.   Hospital Course     1. PAD - PV angio as above, successful orbital atherectomy and drug-coated balloon angioplasty of the left proximal posterior tibial artery, TP trunk and popliteal arteries  - per [name], nurse will take care of scheduling ABI and LEA within 2 to 3 weeks and will notify paitien - now on Plavix 75mg  daily, recommend clarifying duration with [name] at follow-up (cath report states recommend treatment with Plavix without aspirin for 1 month; please re-evaluate at timing of follow-up visit) - switch omeprazole to protonix given Plavix use - post procedure surveillance Hgb came back 13.2 slightly reduced from prior OP value of 15.1 but consistent with expected post-procedure/post-hydration value; no bleeding seen, bleeding precautions relayed to patient - will plan to recheck early next week to ensure stable along with BMET - has f/u with APP 12/06/22   2. CKD 3b - baseline Cr most recently 1.9-2.1 - stable post procedure - recheck next week post angio to ensure stable   3. CAD - continue medical therapy, no acute concerns this admission   4. Essential HTN (with recent issues with orthostatic hypotension earlier this year), chronic HFpEF - SBP this AM  Discharge Summary    Patient ID: Harold [name] MRN: [id number]; DOB: August 07, 1955  Admit date: 11/06/2022 Discharge date: 11/07/2022  PCP:  [name], MD    HeartCare Providers Cardiologist:  [name], MD  Electrophysiologist:  [name], MD       Discharge Diagnoses    Principal Problem:   PAD (peripheral artery disease) [name]) Active Problems:   Essential hypertension   Hyperlipidemia associated with type 2 diabetes mellitus (HCC)   Chronic heart failure with preserved ejection fraction (HFpEF) (HCC)   Persistent atrial fibrillation (HCC)   CKD stage 3b, GFR 30-44 ml/min (HCC)   CAD in native artery    Diagnostic Studies/Procedures     PV angio 11/06/22  1.  No significant aortoiliac disease. 2.  Left lower extremity: Mild distal SFA stenosis, severe calcified stenosis in the mid popliteal artery, severe calcified disease affecting the TP trunk into the bifurcation of the posterior tibial and peroneal arteries.  The anterior tibial artery is occluded proximally. 3.  Successful orbital atherectomy and drug-coated balloon angioplasty of the left proximal posterior tibial artery, TP trunk and popliteal arteries.  The flow to the peroneal artery was improved even though the peroneal artery itself was not treated.   Recommendations: Due to chronic kidney disease, will hydrate overnight and check renal function in the morning.  70 mL of contrast was used for the procedure. Eliquis can be resumed tomorrow morning as long as no bleeding issues from the right femoral access. Recommend treatment with Plavix without aspirin for 1 month. _____________   History of Present Illness      67 y.o. male with PAD, carotid disease, obstructive sleep apnea on CPAP, peripheral arterial disease, essential hypertension with issues with orthostatic dizziness, obesity, chronic kidney disease, type 2 diabetes, persistent atrial fibrillation, chronic HFpEF,  hyperlipidemia, chronic venous insufficiency with stasis dermatitis and chronic venous wounds followed at [facility]. He has been followed by [name] for his PAD. He has prior history of angioplasty to the right TP trunk and posterior tibial artery for severe claudication and chronic wounds in 2021.  He has had chronic venous wounds mostly now affecting the left side. He was known to have significant disease affecting the left popliteal artery and tibial vessels. PV angiography was recommended.   Hospital Course     1. PAD - PV angio as above, successful orbital atherectomy and drug-coated balloon angioplasty of the left proximal posterior tibial artery, TP trunk and popliteal arteries  - per [name], nurse will take care of scheduling ABI and LEA within 2 to 3 weeks and will notify paitien - now on Plavix 75mg  daily, recommend clarifying duration with [name] at follow-up (cath report states recommend treatment with Plavix without aspirin for 1 month; please re-evaluate at timing of follow-up visit) - switch omeprazole to protonix given Plavix use - post procedure surveillance Hgb came back 13.2 slightly reduced from prior OP value of 15.1 but consistent with expected post-procedure/post-hydration value; no bleeding seen, bleeding precautions relayed to patient - will plan to recheck early next week to ensure stable along with BMET - has f/u with APP 12/06/22   2. CKD 3b - baseline Cr most recently 1.9-2.1 - stable post procedure - recheck next week post angio to ensure stable   3. CAD - continue medical therapy, no acute concerns this admission   4. Essential HTN (with recent issues with orthostatic hypotension earlier this year), chronic HFpEF - SBP this AM  Discharge Summary    Patient ID: Harold [name] MRN: [id number]; DOB: August 07, 1955  Admit date: 11/06/2022 Discharge date: 11/07/2022  PCP:  [name], MD    HeartCare Providers Cardiologist:  [name], MD  Electrophysiologist:  [name], MD       Discharge Diagnoses    Principal Problem:   PAD (peripheral artery disease) [name]) Active Problems:   Essential hypertension   Hyperlipidemia associated with type 2 diabetes mellitus (HCC)   Chronic heart failure with preserved ejection fraction (HFpEF) (HCC)   Persistent atrial fibrillation (HCC)   CKD stage 3b, GFR 30-44 ml/min (HCC)   CAD in native artery    Diagnostic Studies/Procedures     PV angio 11/06/22  1.  No significant aortoiliac disease. 2.  Left lower extremity: Mild distal SFA stenosis, severe calcified stenosis in the mid popliteal artery, severe calcified disease affecting the TP trunk into the bifurcation of the posterior tibial and peroneal arteries.  The anterior tibial artery is occluded proximally. 3.  Successful orbital atherectomy and drug-coated balloon angioplasty of the left proximal posterior tibial artery, TP trunk and popliteal arteries.  The flow to the peroneal artery was improved even though the peroneal artery itself was not treated.   Recommendations: Due to chronic kidney disease, will hydrate overnight and check renal function in the morning.  70 mL of contrast was used for the procedure. Eliquis can be resumed tomorrow morning as long as no bleeding issues from the right femoral access. Recommend treatment with Plavix without aspirin for 1 month. _____________   History of Present Illness      67 y.o. male with PAD, carotid disease, obstructive sleep apnea on CPAP, peripheral arterial disease, essential hypertension with issues with orthostatic dizziness, obesity, chronic kidney disease, type 2 diabetes, persistent atrial fibrillation, chronic HFpEF,  hyperlipidemia, chronic venous insufficiency with stasis dermatitis and chronic venous wounds followed at [facility]. He has been followed by [name] for his PAD. He has prior history of angioplasty to the right TP trunk and posterior tibial artery for severe claudication and chronic wounds in 2021.  He has had chronic venous wounds mostly now affecting the left side. He was known to have significant disease affecting the left popliteal artery and tibial vessels. PV angiography was recommended.   Hospital Course     1. PAD - PV angio as above, successful orbital atherectomy and drug-coated balloon angioplasty of the left proximal posterior tibial artery, TP trunk and popliteal arteries  - per [name], nurse will take care of scheduling ABI and LEA within 2 to 3 weeks and will notify paitien - now on Plavix 75mg  daily, recommend clarifying duration with [name] at follow-up (cath report states recommend treatment with Plavix without aspirin for 1 month; please re-evaluate at timing of follow-up visit) - switch omeprazole to protonix given Plavix use - post procedure surveillance Hgb came back 13.2 slightly reduced from prior OP value of 15.1 but consistent with expected post-procedure/post-hydration value; no bleeding seen, bleeding precautions relayed to patient - will plan to recheck early next week to ensure stable along with BMET - has f/u with APP 12/06/22   2. CKD 3b - baseline Cr most recently 1.9-2.1 - stable post procedure - recheck next week post angio to ensure stable   3. CAD - continue medical therapy, no acute concerns this admission   4. Essential HTN (with recent issues with orthostatic hypotension earlier this year), chronic HFpEF - SBP this AM  Discharge Summary    Patient ID: Harold [name] MRN: [id number]; DOB: August 07, 1955  Admit date: 11/06/2022 Discharge date: 11/07/2022  PCP:  [name], MD    HeartCare Providers Cardiologist:  [name], MD  Electrophysiologist:  [name], MD       Discharge Diagnoses    Principal Problem:   PAD (peripheral artery disease) [name]) Active Problems:   Essential hypertension   Hyperlipidemia associated with type 2 diabetes mellitus (HCC)   Chronic heart failure with preserved ejection fraction (HFpEF) (HCC)   Persistent atrial fibrillation (HCC)   CKD stage 3b, GFR 30-44 ml/min (HCC)   CAD in native artery    Diagnostic Studies/Procedures     PV angio 11/06/22  1.  No significant aortoiliac disease. 2.  Left lower extremity: Mild distal SFA stenosis, severe calcified stenosis in the mid popliteal artery, severe calcified disease affecting the TP trunk into the bifurcation of the posterior tibial and peroneal arteries.  The anterior tibial artery is occluded proximally. 3.  Successful orbital atherectomy and drug-coated balloon angioplasty of the left proximal posterior tibial artery, TP trunk and popliteal arteries.  The flow to the peroneal artery was improved even though the peroneal artery itself was not treated.   Recommendations: Due to chronic kidney disease, will hydrate overnight and check renal function in the morning.  70 mL of contrast was used for the procedure. Eliquis can be resumed tomorrow morning as long as no bleeding issues from the right femoral access. Recommend treatment with Plavix without aspirin for 1 month. _____________   History of Present Illness      67 y.o. male with PAD, carotid disease, obstructive sleep apnea on CPAP, peripheral arterial disease, essential hypertension with issues with orthostatic dizziness, obesity, chronic kidney disease, type 2 diabetes, persistent atrial fibrillation, chronic HFpEF,  hyperlipidemia, chronic venous insufficiency with stasis dermatitis and chronic venous wounds followed at [facility]. He has been followed by [name] for his PAD. He has prior history of angioplasty to the right TP trunk and posterior tibial artery for severe claudication and chronic wounds in 2021.  He has had chronic venous wounds mostly now affecting the left side. He was known to have significant disease affecting the left popliteal artery and tibial vessels. PV angiography was recommended.   Hospital Course     1. PAD - PV angio as above, successful orbital atherectomy and drug-coated balloon angioplasty of the left proximal posterior tibial artery, TP trunk and popliteal arteries  - per [name], nurse will take care of scheduling ABI and LEA within 2 to 3 weeks and will notify paitien - now on Plavix 75mg  daily, recommend clarifying duration with [name] at follow-up (cath report states recommend treatment with Plavix without aspirin for 1 month; please re-evaluate at timing of follow-up visit) - switch omeprazole to protonix given Plavix use - post procedure surveillance Hgb came back 13.2 slightly reduced from prior OP value of 15.1 but consistent with expected post-procedure/post-hydration value; no bleeding seen, bleeding precautions relayed to patient - will plan to recheck early next week to ensure stable along with BMET - has f/u with APP 12/06/22   2. CKD 3b - baseline Cr most recently 1.9-2.1 - stable post procedure - recheck next week post angio to ensure stable   3. CAD - continue medical therapy, no acute concerns this admission   4. Essential HTN (with recent issues with orthostatic hypotension earlier this year), chronic HFpEF - SBP this AM  Discharge Summary    Patient ID: Harold [name] MRN: [id number]; DOB: August 07, 1955  Admit date: 11/06/2022 Discharge date: 11/07/2022  PCP:  [name], MD    HeartCare Providers Cardiologist:  [name], MD  Electrophysiologist:  [name], MD       Discharge Diagnoses    Principal Problem:   PAD (peripheral artery disease) [name]) Active Problems:   Essential hypertension   Hyperlipidemia associated with type 2 diabetes mellitus (HCC)   Chronic heart failure with preserved ejection fraction (HFpEF) (HCC)   Persistent atrial fibrillation (HCC)   CKD stage 3b, GFR 30-44 ml/min (HCC)   CAD in native artery    Diagnostic Studies/Procedures     PV angio 11/06/22  1.  No significant aortoiliac disease. 2.  Left lower extremity: Mild distal SFA stenosis, severe calcified stenosis in the mid popliteal artery, severe calcified disease affecting the TP trunk into the bifurcation of the posterior tibial and peroneal arteries.  The anterior tibial artery is occluded proximally. 3.  Successful orbital atherectomy and drug-coated balloon angioplasty of the left proximal posterior tibial artery, TP trunk and popliteal arteries.  The flow to the peroneal artery was improved even though the peroneal artery itself was not treated.   Recommendations: Due to chronic kidney disease, will hydrate overnight and check renal function in the morning.  70 mL of contrast was used for the procedure. Eliquis can be resumed tomorrow morning as long as no bleeding issues from the right femoral access. Recommend treatment with Plavix without aspirin for 1 month. _____________   History of Present Illness      67 y.o. male with PAD, carotid disease, obstructive sleep apnea on CPAP, peripheral arterial disease, essential hypertension with issues with orthostatic dizziness, obesity, chronic kidney disease, type 2 diabetes, persistent atrial fibrillation, chronic HFpEF,  hyperlipidemia, chronic venous insufficiency with stasis dermatitis and chronic venous wounds followed at [facility]. He has been followed by [name] for his PAD. He has prior history of angioplasty to the right TP trunk and posterior tibial artery for severe claudication and chronic wounds in 2021.  He has had chronic venous wounds mostly now affecting the left side. He was known to have significant disease affecting the left popliteal artery and tibial vessels. PV angiography was recommended.   Hospital Course     1. PAD - PV angio as above, successful orbital atherectomy and drug-coated balloon angioplasty of the left proximal posterior tibial artery, TP trunk and popliteal arteries  - per [name], nurse will take care of scheduling ABI and LEA within 2 to 3 weeks and will notify paitien - now on Plavix 75mg  daily, recommend clarifying duration with [name] at follow-up (cath report states recommend treatment with Plavix without aspirin for 1 month; please re-evaluate at timing of follow-up visit) - switch omeprazole to protonix given Plavix use - post procedure surveillance Hgb came back 13.2 slightly reduced from prior OP value of 15.1 but consistent with expected post-procedure/post-hydration value; no bleeding seen, bleeding precautions relayed to patient - will plan to recheck early next week to ensure stable along with BMET - has f/u with APP 12/06/22   2. CKD 3b - baseline Cr most recently 1.9-2.1 - stable post procedure - recheck next week post angio to ensure stable   3. CAD - continue medical therapy, no acute concerns this admission   4. Essential HTN (with recent issues with orthostatic hypotension earlier this year), chronic HFpEF - SBP this AM  PTA      150               0.97 monophasic        +---------+------------------+-----+----------+-------+ PERO     122               0.79 monophasic        +---------+------------------+-----+----------+-------+ Great Toe93                0.60 Abnormal          +---------+------------------+-----+----------+-------+ +-------+-----------+-----------+------------+------------+ ABI/TBIToday's ABIToday's TBIPrevious ABIPrevious TBI +-------+-----------+-----------+------------+------------+ Right  1.16       0.75       1.23        0.64         +-------+-----------+-----------+------------+------------+ Left   0.97       0.60       0.91        0.45         +-------+-----------+-----------+------------+------------+ Bilateral ABIs and TBIs appear essentially unchanged. See Arterial Duplex study  Summary: Right: Resting right ankle-brachial index is within normal range. The right toe-brachial index is normal. Left: Resting left ankle-brachial index is within normal range. The left toe-brachial index is abnormal. Although ankle brachial indices are within normal limits (0.95-1.29), arterial Doppler waveforms at the ankle suggest some component of arterial occlusive disease. *See table(s) above for measurements and observations.  Electronically signed by Nanetta Batty MD on 10/24/2022 at 1:18:56 PM.    Final    VAS [place] LOWER EXTREMITY ARTERIAL DUPLEX  Result Date: 10/24/2022 LOWER EXTREMITY ARTERIAL DUPLEX STUDY Patient Name:  Harold [name]  Date of Exam:   10/23/2022 Medical Rec #: [id number]             Accession #:    [id number] Date of Birth: [birth year]             Patient Gender: M Patient Age:   31 years  Exam Location:  [facility] Procedure:      VAS [place] LOWER EXTREMITY ARTERIAL DUPLEX Referring Phys: CALLIE GOODRICH --------------------------------------------------------------------------------  Indications: Ulceration, and peripheral artery disease. High Risk Factors: Hypertension, hyperlipidemia, past history of smoking,                    coronary artery disease. Other Factors: Patient has nonhealing ulcers on his left lower leg.  Vascular Interventions: Successful angioplasty of the right TP trunk into the                         posterior tibial artery followed by drug-coated                         balloon angioplasty of the TP trunk on 10/13/2019. Current ABI:            right 1.16, Left 0.97 Comparison Study: Arterial Duplex done 03/13/21 showed 30-49% stenosis in the                   distal left SFA, occluded mid ATA and occluded peroneal                   artery. Right leg was not done. Performing Technologist: Jake Seats RDMS, RVT, RDCS  Examination Guidelines: A complete evaluation includes B-mode imaging, spectral Doppler, color Doppler, and power Doppler as needed of all accessible portions of each vessel. Bilateral testing is considered an integral part of a complete examination. Limited  Discharge Summary    Patient ID: Harold [name] MRN: [id number]; DOB: August 07, 1955  Admit date: 11/06/2022 Discharge date: 11/07/2022  PCP:  [name], MD    HeartCare Providers Cardiologist:  [name], MD  Electrophysiologist:  [name], MD       Discharge Diagnoses    Principal Problem:   PAD (peripheral artery disease) [name]) Active Problems:   Essential hypertension   Hyperlipidemia associated with type 2 diabetes mellitus (HCC)   Chronic heart failure with preserved ejection fraction (HFpEF) (HCC)   Persistent atrial fibrillation (HCC)   CKD stage 3b, GFR 30-44 ml/min (HCC)   CAD in native artery    Diagnostic Studies/Procedures     PV angio 11/06/22  1.  No significant aortoiliac disease. 2.  Left lower extremity: Mild distal SFA stenosis, severe calcified stenosis in the mid popliteal artery, severe calcified disease affecting the TP trunk into the bifurcation of the posterior tibial and peroneal arteries.  The anterior tibial artery is occluded proximally. 3.  Successful orbital atherectomy and drug-coated balloon angioplasty of the left proximal posterior tibial artery, TP trunk and popliteal arteries.  The flow to the peroneal artery was improved even though the peroneal artery itself was not treated.   Recommendations: Due to chronic kidney disease, will hydrate overnight and check renal function in the morning.  70 mL of contrast was used for the procedure. Eliquis can be resumed tomorrow morning as long as no bleeding issues from the right femoral access. Recommend treatment with Plavix without aspirin for 1 month. _____________   History of Present Illness      67 y.o. male with PAD, carotid disease, obstructive sleep apnea on CPAP, peripheral arterial disease, essential hypertension with issues with orthostatic dizziness, obesity, chronic kidney disease, type 2 diabetes, persistent atrial fibrillation, chronic HFpEF,  hyperlipidemia, chronic venous insufficiency with stasis dermatitis and chronic venous wounds followed at [facility]. He has been followed by [name] for his PAD. He has prior history of angioplasty to the right TP trunk and posterior tibial artery for severe claudication and chronic wounds in 2021.  He has had chronic venous wounds mostly now affecting the left side. He was known to have significant disease affecting the left popliteal artery and tibial vessels. PV angiography was recommended.   Hospital Course     1. PAD - PV angio as above, successful orbital atherectomy and drug-coated balloon angioplasty of the left proximal posterior tibial artery, TP trunk and popliteal arteries  - per [name], nurse will take care of scheduling ABI and LEA within 2 to 3 weeks and will notify paitien - now on Plavix 75mg  daily, recommend clarifying duration with [name] at follow-up (cath report states recommend treatment with Plavix without aspirin for 1 month; please re-evaluate at timing of follow-up visit) - switch omeprazole to protonix given Plavix use - post procedure surveillance Hgb came back 13.2 slightly reduced from prior OP value of 15.1 but consistent with expected post-procedure/post-hydration value; no bleeding seen, bleeding precautions relayed to patient - will plan to recheck early next week to ensure stable along with BMET - has f/u with APP 12/06/22   2. CKD 3b - baseline Cr most recently 1.9-2.1 - stable post procedure - recheck next week post angio to ensure stable   3. CAD - continue medical therapy, no acute concerns this admission   4. Essential HTN (with recent issues with orthostatic hypotension earlier this year), chronic HFpEF - SBP this AM  PTA      150               0.97 monophasic        +---------+------------------+-----+----------+-------+ PERO     122               0.79 monophasic        +---------+------------------+-----+----------+-------+ Great Toe93                0.60 Abnormal          +---------+------------------+-----+----------+-------+ +-------+-----------+-----------+------------+------------+ ABI/TBIToday's ABIToday's TBIPrevious ABIPrevious TBI +-------+-----------+-----------+------------+------------+ Right  1.16       0.75       1.23        0.64         +-------+-----------+-----------+------------+------------+ Left   0.97       0.60       0.91        0.45         +-------+-----------+-----------+------------+------------+ Bilateral ABIs and TBIs appear essentially unchanged. See Arterial Duplex study  Summary: Right: Resting right ankle-brachial index is within normal range. The right toe-brachial index is normal. Left: Resting left ankle-brachial index is within normal range. The left toe-brachial index is abnormal. Although ankle brachial indices are within normal limits (0.95-1.29), arterial Doppler waveforms at the ankle suggest some component of arterial occlusive disease. *See table(s) above for measurements and observations.  Electronically signed by Nanetta Batty MD on 10/24/2022 at 1:18:56 PM.    Final    VAS [place] LOWER EXTREMITY ARTERIAL DUPLEX  Result Date: 10/24/2022 LOWER EXTREMITY ARTERIAL DUPLEX STUDY Patient Name:  Harold [name]  Date of Exam:   10/23/2022 Medical Rec #: [id number]             Accession #:    [id number] Date of Birth: [birth year]             Patient Gender: M Patient Age:   31 years  Exam Location:  [facility] Procedure:      VAS [place] LOWER EXTREMITY ARTERIAL DUPLEX Referring Phys: CALLIE GOODRICH --------------------------------------------------------------------------------  Indications: Ulceration, and peripheral artery disease. High Risk Factors: Hypertension, hyperlipidemia, past history of smoking,                    coronary artery disease. Other Factors: Patient has nonhealing ulcers on his left lower leg.  Vascular Interventions: Successful angioplasty of the right TP trunk into the                         posterior tibial artery followed by drug-coated                         balloon angioplasty of the TP trunk on 10/13/2019. Current ABI:            right 1.16, Left 0.97 Comparison Study: Arterial Duplex done 03/13/21 showed 30-49% stenosis in the                   distal left SFA, occluded mid ATA and occluded peroneal                   artery. Right leg was not done. Performing Technologist: Jake Seats RDMS, RVT, RDCS  Examination Guidelines: A complete evaluation includes B-mode imaging, spectral Doppler, color Doppler, and power Doppler as needed of all accessible portions of each vessel. Bilateral testing is considered an integral part of a complete examination. Limited  PTA      150               0.97 monophasic        +---------+------------------+-----+----------+-------+ PERO     122               0.79 monophasic        +---------+------------------+-----+----------+-------+ Great Toe93                0.60 Abnormal          +---------+------------------+-----+----------+-------+ +-------+-----------+-----------+------------+------------+ ABI/TBIToday's ABIToday's TBIPrevious ABIPrevious TBI +-------+-----------+-----------+------------+------------+ Right  1.16       0.75       1.23        0.64         +-------+-----------+-----------+------------+------------+ Left   0.97       0.60       0.91        0.45         +-------+-----------+-----------+------------+------------+ Bilateral ABIs and TBIs appear essentially unchanged. See Arterial Duplex study  Summary: Right: Resting right ankle-brachial index is within normal range. The right toe-brachial index is normal. Left: Resting left ankle-brachial index is within normal range. The left toe-brachial index is abnormal. Although ankle brachial indices are within normal limits (0.95-1.29), arterial Doppler waveforms at the ankle suggest some component of arterial occlusive disease. *See table(s) above for measurements and observations.  Electronically signed by Nanetta Batty MD on 10/24/2022 at 1:18:56 PM.    Final    VAS [place] LOWER EXTREMITY ARTERIAL DUPLEX  Result Date: 10/24/2022 LOWER EXTREMITY ARTERIAL DUPLEX STUDY Patient Name:  Harold [name]  Date of Exam:   10/23/2022 Medical Rec #: [id number]             Accession #:    [id number] Date of Birth: [birth year]             Patient Gender: M Patient Age:   31 years  Exam Location:  [facility] Procedure:      VAS [place] LOWER EXTREMITY ARTERIAL DUPLEX Referring Phys: CALLIE GOODRICH --------------------------------------------------------------------------------  Indications: Ulceration, and peripheral artery disease. High Risk Factors: Hypertension, hyperlipidemia, past history of smoking,                    coronary artery disease. Other Factors: Patient has nonhealing ulcers on his left lower leg.  Vascular Interventions: Successful angioplasty of the right TP trunk into the                         posterior tibial artery followed by drug-coated                         balloon angioplasty of the TP trunk on 10/13/2019. Current ABI:            right 1.16, Left 0.97 Comparison Study: Arterial Duplex done 03/13/21 showed 30-49% stenosis in the                   distal left SFA, occluded mid ATA and occluded peroneal                   artery. Right leg was not done. Performing Technologist: Jake Seats RDMS, RVT, RDCS  Examination Guidelines: A complete evaluation includes B-mode imaging, spectral Doppler, color Doppler, and power Doppler as needed of all accessible portions of each vessel. Bilateral testing is considered an integral part of a complete examination. Limited

## 2022-11-08 ENCOUNTER — Other Ambulatory Visit (HOSPITAL_COMMUNITY): Payer: Self-pay

## 2022-11-08 LAB — POCT ACTIVATED CLOTTING TIME
Activated Clotting Time: 354 seconds
Activated Clotting Time: 281 seconds

## 2022-11-09 ENCOUNTER — Other Ambulatory Visit (HOSPITAL_COMMUNITY): Payer: Self-pay

## 2022-11-11 ENCOUNTER — Other Ambulatory Visit: Payer: Self-pay

## 2022-11-11 DIAGNOSIS — R262 Difficulty in walking, not elsewhere classified: Secondary | ICD-10-CM | POA: Diagnosis not present

## 2022-11-11 DIAGNOSIS — H8110 Benign paroxysmal vertigo, unspecified ear: Secondary | ICD-10-CM | POA: Diagnosis not present

## 2022-11-11 DIAGNOSIS — N1832 Chronic kidney disease, stage 3b: Secondary | ICD-10-CM | POA: Diagnosis not present

## 2022-11-11 DIAGNOSIS — R2689 Other abnormalities of gait and mobility: Secondary | ICD-10-CM | POA: Diagnosis not present

## 2022-11-11 DIAGNOSIS — I739 Peripheral vascular disease, unspecified: Secondary | ICD-10-CM | POA: Diagnosis not present

## 2022-11-11 DIAGNOSIS — M6281 Muscle weakness (generalized): Secondary | ICD-10-CM | POA: Diagnosis not present

## 2022-11-12 ENCOUNTER — Telehealth: Payer: Self-pay | Admitting: *Deleted

## 2022-11-12 DIAGNOSIS — S90821D Blister (nonthermal), right foot, subsequent encounter: Secondary | ICD-10-CM | POA: Diagnosis not present

## 2022-11-12 DIAGNOSIS — Z794 Long term (current) use of insulin: Secondary | ICD-10-CM | POA: Diagnosis not present

## 2022-11-12 DIAGNOSIS — E1165 Type 2 diabetes mellitus with hyperglycemia: Secondary | ICD-10-CM | POA: Diagnosis not present

## 2022-11-12 DIAGNOSIS — L97319 Non-pressure chronic ulcer of right ankle with unspecified severity: Secondary | ICD-10-CM | POA: Diagnosis not present

## 2022-11-12 DIAGNOSIS — L89512 Pressure ulcer of right ankle, stage 2: Secondary | ICD-10-CM | POA: Diagnosis not present

## 2022-11-12 DIAGNOSIS — S81001D Unspecified open wound, right knee, subsequent encounter: Secondary | ICD-10-CM | POA: Diagnosis not present

## 2022-11-12 DIAGNOSIS — Z7984 Long term (current) use of oral hypoglycemic drugs: Secondary | ICD-10-CM | POA: Diagnosis not present

## 2022-11-12 DIAGNOSIS — E11622 Type 2 diabetes mellitus with other skin ulcer: Secondary | ICD-10-CM | POA: Diagnosis not present

## 2022-11-12 DIAGNOSIS — R600 Localized edema: Secondary | ICD-10-CM | POA: Diagnosis not present

## 2022-11-12 DIAGNOSIS — I890 Lymphedema, not elsewhere classified: Secondary | ICD-10-CM | POA: Diagnosis not present

## 2022-11-12 DIAGNOSIS — N1832 Chronic kidney disease, stage 3b: Secondary | ICD-10-CM

## 2022-11-12 LAB — HEMOGLOBIN AND HEMATOCRIT, BLOOD
Hematocrit: 43.8 % (ref 37.5–51.0)
Hemoglobin: 14.6 g/dL (ref 13.0–17.7)

## 2022-11-12 LAB — BASIC METABOLIC PANEL
BUN/Creatinine Ratio: 10 (ref 10–24)
BUN: 22 mg/dL (ref 8–27)
CO2: 26 mmol/L (ref 20–29)
Calcium: 9.7 mg/dL (ref 8.6–10.2)
Chloride: 99 mmol/L (ref 96–106)
Creatinine, Ser: 2.18 mg/dL — ABNORMAL HIGH (ref 0.76–1.27)
Glucose: 85 mg/dL (ref 70–99)
Potassium: 4.7 mmol/L (ref 3.5–5.2)
Sodium: 142 mmol/L (ref 134–144)
eGFR: 32 mL/min/{1.73_m2} — ABNORMAL LOW (ref >59)

## 2022-11-12 NOTE — Telephone Encounter (Signed)
-----   Message from Laurann Montana sent at 11/12/2022  8:31 AM EDT ----- Please let pt know labs overall stable. Kidney function more abnormal from prior but the values in the hospital were likely better than normal because of the hydration. This BMET looks more similar to his prior baseline. For completeness, given the slight uptrend and contrast exposure, I would recommend repeating BMET on Wednesday or Thursday of this week to ensure stable. Does not need repeat H/H at that time.

## 2022-11-12 NOTE — Telephone Encounter (Signed)
Left message to call office

## 2022-11-13 ENCOUNTER — Telehealth: Payer: Self-pay | Admitting: Cardiology

## 2022-11-13 DIAGNOSIS — H8110 Benign paroxysmal vertigo, unspecified ear: Secondary | ICD-10-CM | POA: Diagnosis not present

## 2022-11-13 DIAGNOSIS — R262 Difficulty in walking, not elsewhere classified: Secondary | ICD-10-CM | POA: Diagnosis not present

## 2022-11-13 DIAGNOSIS — R2689 Other abnormalities of gait and mobility: Secondary | ICD-10-CM | POA: Diagnosis not present

## 2022-11-13 DIAGNOSIS — M6281 Muscle weakness (generalized): Secondary | ICD-10-CM | POA: Diagnosis not present

## 2022-11-13 NOTE — Telephone Encounter (Signed)
Duplicate encounter. Please see previous encounter.  

## 2022-11-13 NOTE — Telephone Encounter (Signed)
Patient was returning phone call

## 2022-11-13 NOTE — Telephone Encounter (Signed)
Spoke with patient he is aware of lab results and provider recommendation. BMET order placed. He states he will come tomorrow after 11

## 2022-11-14 ENCOUNTER — Other Ambulatory Visit: Payer: Self-pay

## 2022-11-14 ENCOUNTER — Other Ambulatory Visit: Payer: Medicare Other

## 2022-11-14 DIAGNOSIS — N1832 Chronic kidney disease, stage 3b: Secondary | ICD-10-CM | POA: Diagnosis not present

## 2022-11-15 LAB — BASIC METABOLIC PANEL
BUN/Creatinine Ratio: 10 (ref 10–24)
BUN: 23 mg/dL (ref 8–27)
CO2: 26 mmol/L (ref 20–29)
Calcium: 8.6 mg/dL (ref 8.6–10.2)
Chloride: 102 mmol/L (ref 96–106)
Creatinine, Ser: 2.2 mg/dL — ABNORMAL HIGH (ref 0.76–1.27)
Glucose: 81 mg/dL (ref 70–99)
Potassium: 4.3 mmol/L (ref 3.5–5.2)
Sodium: 141 mmol/L (ref 134–144)
eGFR: 32 mL/min/{1.73_m2} — ABNORMAL LOW (ref >59)

## 2022-11-18 DIAGNOSIS — R2689 Other abnormalities of gait and mobility: Secondary | ICD-10-CM | POA: Diagnosis not present

## 2022-11-18 DIAGNOSIS — H8110 Benign paroxysmal vertigo, unspecified ear: Secondary | ICD-10-CM | POA: Diagnosis not present

## 2022-11-18 DIAGNOSIS — M6281 Muscle weakness (generalized): Secondary | ICD-10-CM | POA: Diagnosis not present

## 2022-11-18 DIAGNOSIS — R262 Difficulty in walking, not elsewhere classified: Secondary | ICD-10-CM | POA: Diagnosis not present

## 2022-11-20 ENCOUNTER — Other Ambulatory Visit: Payer: Self-pay | Admitting: Podiatry

## 2022-11-20 ENCOUNTER — Other Ambulatory Visit: Payer: Self-pay | Admitting: Physician Assistant

## 2022-11-20 DIAGNOSIS — M6281 Muscle weakness (generalized): Secondary | ICD-10-CM | POA: Diagnosis not present

## 2022-11-20 DIAGNOSIS — R262 Difficulty in walking, not elsewhere classified: Secondary | ICD-10-CM | POA: Diagnosis not present

## 2022-11-20 DIAGNOSIS — H8110 Benign paroxysmal vertigo, unspecified ear: Secondary | ICD-10-CM | POA: Diagnosis not present

## 2022-11-20 DIAGNOSIS — R2689 Other abnormalities of gait and mobility: Secondary | ICD-10-CM | POA: Diagnosis not present

## 2022-11-22 ENCOUNTER — Encounter (HOSPITAL_COMMUNITY): Payer: Medicare Other

## 2022-11-25 DIAGNOSIS — R2689 Other abnormalities of gait and mobility: Secondary | ICD-10-CM | POA: Diagnosis not present

## 2022-11-25 DIAGNOSIS — R262 Difficulty in walking, not elsewhere classified: Secondary | ICD-10-CM | POA: Diagnosis not present

## 2022-11-25 DIAGNOSIS — H8110 Benign paroxysmal vertigo, unspecified ear: Secondary | ICD-10-CM | POA: Diagnosis not present

## 2022-11-25 DIAGNOSIS — M6281 Muscle weakness (generalized): Secondary | ICD-10-CM | POA: Diagnosis not present

## 2022-11-26 ENCOUNTER — Other Ambulatory Visit (HOSPITAL_COMMUNITY): Payer: Self-pay | Admitting: Cardiology

## 2022-11-26 ENCOUNTER — Ambulatory Visit (HOSPITAL_BASED_OUTPATIENT_CLINIC_OR_DEPARTMENT_OTHER)
Admission: RE | Admit: 2022-11-26 | Discharge: 2022-11-26 | Disposition: A | Discharge status: Home / Self Care | Payer: Medicare Other | Source: Ambulatory Visit | Attending: Cardiovascular Disease | Admitting: Cardiovascular Disease

## 2022-11-26 ENCOUNTER — Ambulatory Visit (HOSPITAL_COMMUNITY)
Admission: RE | Admit: 2022-11-26 | Discharge: 2022-11-26 | Disposition: A | Discharge status: Home / Self Care | Payer: Medicare Other | Source: Ambulatory Visit | Attending: Cardiovascular Disease | Admitting: Cardiovascular Disease

## 2022-11-26 DIAGNOSIS — I739 Peripheral vascular disease, unspecified: Secondary | ICD-10-CM | POA: Insufficient documentation

## 2022-11-26 DIAGNOSIS — I890 Lymphedema, not elsewhere classified: Secondary | ICD-10-CM | POA: Diagnosis not present

## 2022-11-26 DIAGNOSIS — I83892 Varicose veins of left lower extremities with other complications: Secondary | ICD-10-CM | POA: Diagnosis not present

## 2022-11-26 DIAGNOSIS — E119 Type 2 diabetes mellitus without complications: Secondary | ICD-10-CM | POA: Diagnosis not present

## 2022-11-26 DIAGNOSIS — L97922 Non-pressure chronic ulcer of unspecified part of left lower leg with fat layer exposed: Secondary | ICD-10-CM | POA: Diagnosis not present

## 2022-11-26 DIAGNOSIS — L97319 Non-pressure chronic ulcer of right ankle with unspecified severity: Secondary | ICD-10-CM | POA: Diagnosis not present

## 2022-11-26 DIAGNOSIS — Z7985 Long-term (current) use of injectable non-insulin antidiabetic drugs: Secondary | ICD-10-CM | POA: Diagnosis not present

## 2022-11-26 DIAGNOSIS — E11622 Type 2 diabetes mellitus with other skin ulcer: Secondary | ICD-10-CM | POA: Diagnosis not present

## 2022-11-26 DIAGNOSIS — S80922D Unspecified superficial injury of left lower leg, subsequent encounter: Secondary | ICD-10-CM | POA: Diagnosis not present

## 2022-11-26 DIAGNOSIS — Z7984 Long term (current) use of oral hypoglycemic drugs: Secondary | ICD-10-CM | POA: Diagnosis not present

## 2022-11-26 DIAGNOSIS — Z87828 Personal history of other (healed) physical injury and trauma: Secondary | ICD-10-CM | POA: Diagnosis not present

## 2022-11-26 DIAGNOSIS — I10 Essential (primary) hypertension: Secondary | ICD-10-CM | POA: Diagnosis not present

## 2022-11-26 DIAGNOSIS — E1165 Type 2 diabetes mellitus with hyperglycemia: Secondary | ICD-10-CM | POA: Diagnosis not present

## 2022-11-26 DIAGNOSIS — I5043 Acute on chronic combined systolic (congestive) and diastolic (congestive) heart failure: Secondary | ICD-10-CM

## 2022-11-28 DIAGNOSIS — I951 Orthostatic hypotension: Secondary | ICD-10-CM | POA: Diagnosis not present

## 2022-11-28 DIAGNOSIS — Z794 Long term (current) use of insulin: Secondary | ICD-10-CM | POA: Diagnosis not present

## 2022-11-28 DIAGNOSIS — R262 Difficulty in walking, not elsewhere classified: Secondary | ICD-10-CM | POA: Diagnosis not present

## 2022-11-28 DIAGNOSIS — E785 Hyperlipidemia, unspecified: Secondary | ICD-10-CM | POA: Diagnosis not present

## 2022-11-28 DIAGNOSIS — R2689 Other abnormalities of gait and mobility: Secondary | ICD-10-CM | POA: Diagnosis not present

## 2022-11-28 DIAGNOSIS — E1140 Type 2 diabetes mellitus with diabetic neuropathy, unspecified: Secondary | ICD-10-CM | POA: Diagnosis not present

## 2022-11-28 DIAGNOSIS — I5032 Chronic diastolic (congestive) heart failure: Secondary | ICD-10-CM | POA: Diagnosis not present

## 2022-11-28 DIAGNOSIS — I10 Essential (primary) hypertension: Secondary | ICD-10-CM | POA: Diagnosis not present

## 2022-11-28 DIAGNOSIS — I2510 Atherosclerotic heart disease of native coronary artery without angina pectoris: Secondary | ICD-10-CM | POA: Diagnosis not present

## 2022-11-28 DIAGNOSIS — M6281 Muscle weakness (generalized): Secondary | ICD-10-CM | POA: Diagnosis not present

## 2022-11-28 DIAGNOSIS — E039 Hypothyroidism, unspecified: Secondary | ICD-10-CM | POA: Diagnosis not present

## 2022-11-28 DIAGNOSIS — I482 Chronic atrial fibrillation, unspecified: Secondary | ICD-10-CM | POA: Diagnosis not present

## 2022-11-28 DIAGNOSIS — H8110 Benign paroxysmal vertigo, unspecified ear: Secondary | ICD-10-CM | POA: Diagnosis not present

## 2022-11-28 LAB — VAS US ABI WITH/WO TBI
Left ABI: 0.89
Right ABI: 0.95

## 2022-11-29 NOTE — Addendum Note (Signed)
Addended by: Sandi Mariscal on: 11/29/2022 11:43 AM   Modules accepted: Orders

## 2022-12-02 ENCOUNTER — Other Ambulatory Visit (HOSPITAL_COMMUNITY): Payer: Self-pay

## 2022-12-03 DIAGNOSIS — M6281 Muscle weakness (generalized): Secondary | ICD-10-CM | POA: Diagnosis not present

## 2022-12-03 DIAGNOSIS — R262 Difficulty in walking, not elsewhere classified: Secondary | ICD-10-CM | POA: Diagnosis not present

## 2022-12-03 DIAGNOSIS — R2689 Other abnormalities of gait and mobility: Secondary | ICD-10-CM | POA: Diagnosis not present

## 2022-12-03 DIAGNOSIS — H8110 Benign paroxysmal vertigo, unspecified ear: Secondary | ICD-10-CM | POA: Diagnosis not present

## 2022-12-04 ENCOUNTER — Other Ambulatory Visit (HOSPITAL_COMMUNITY): Payer: Self-pay

## 2022-12-04 DIAGNOSIS — I10 Essential (primary) hypertension: Secondary | ICD-10-CM | POA: Diagnosis not present

## 2022-12-04 DIAGNOSIS — E1140 Type 2 diabetes mellitus with diabetic neuropathy, unspecified: Secondary | ICD-10-CM | POA: Diagnosis not present

## 2022-12-04 DIAGNOSIS — Z794 Long term (current) use of insulin: Secondary | ICD-10-CM | POA: Diagnosis not present

## 2022-12-04 DIAGNOSIS — I951 Orthostatic hypotension: Secondary | ICD-10-CM | POA: Diagnosis not present

## 2022-12-04 DIAGNOSIS — I482 Chronic atrial fibrillation, unspecified: Secondary | ICD-10-CM | POA: Diagnosis not present

## 2022-12-04 DIAGNOSIS — I2510 Atherosclerotic heart disease of native coronary artery without angina pectoris: Secondary | ICD-10-CM | POA: Diagnosis not present

## 2022-12-04 DIAGNOSIS — E785 Hyperlipidemia, unspecified: Secondary | ICD-10-CM | POA: Diagnosis not present

## 2022-12-04 DIAGNOSIS — E039 Hypothyroidism, unspecified: Secondary | ICD-10-CM | POA: Diagnosis not present

## 2022-12-04 DIAGNOSIS — E11649 Type 2 diabetes mellitus with hypoglycemia without coma: Secondary | ICD-10-CM | POA: Diagnosis not present

## 2022-12-04 DIAGNOSIS — I5032 Chronic diastolic (congestive) heart failure: Secondary | ICD-10-CM | POA: Diagnosis not present

## 2022-12-05 DIAGNOSIS — M6281 Muscle weakness (generalized): Secondary | ICD-10-CM | POA: Diagnosis not present

## 2022-12-05 DIAGNOSIS — R2689 Other abnormalities of gait and mobility: Secondary | ICD-10-CM | POA: Diagnosis not present

## 2022-12-05 DIAGNOSIS — H8110 Benign paroxysmal vertigo, unspecified ear: Secondary | ICD-10-CM | POA: Diagnosis not present

## 2022-12-05 DIAGNOSIS — R262 Difficulty in walking, not elsewhere classified: Secondary | ICD-10-CM | POA: Diagnosis not present

## 2022-12-06 ENCOUNTER — Ambulatory Visit: Payer: Medicare Other | Admitting: Physician Assistant

## 2022-12-09 DIAGNOSIS — I5032 Chronic diastolic (congestive) heart failure: Secondary | ICD-10-CM | POA: Diagnosis not present

## 2022-12-09 DIAGNOSIS — E039 Hypothyroidism, unspecified: Secondary | ICD-10-CM | POA: Diagnosis not present

## 2022-12-09 DIAGNOSIS — H8110 Benign paroxysmal vertigo, unspecified ear: Secondary | ICD-10-CM | POA: Diagnosis not present

## 2022-12-09 DIAGNOSIS — I10 Essential (primary) hypertension: Secondary | ICD-10-CM | POA: Diagnosis not present

## 2022-12-09 DIAGNOSIS — R2689 Other abnormalities of gait and mobility: Secondary | ICD-10-CM | POA: Diagnosis not present

## 2022-12-09 DIAGNOSIS — E11649 Type 2 diabetes mellitus with hypoglycemia without coma: Secondary | ICD-10-CM | POA: Diagnosis not present

## 2022-12-09 DIAGNOSIS — E785 Hyperlipidemia, unspecified: Secondary | ICD-10-CM | POA: Diagnosis not present

## 2022-12-09 DIAGNOSIS — Z794 Long term (current) use of insulin: Secondary | ICD-10-CM | POA: Diagnosis not present

## 2022-12-09 DIAGNOSIS — M6281 Muscle weakness (generalized): Secondary | ICD-10-CM | POA: Diagnosis not present

## 2022-12-09 DIAGNOSIS — I482 Chronic atrial fibrillation, unspecified: Secondary | ICD-10-CM | POA: Diagnosis not present

## 2022-12-09 DIAGNOSIS — R262 Difficulty in walking, not elsewhere classified: Secondary | ICD-10-CM | POA: Diagnosis not present

## 2022-12-09 DIAGNOSIS — I2510 Atherosclerotic heart disease of native coronary artery without angina pectoris: Secondary | ICD-10-CM | POA: Diagnosis not present

## 2022-12-09 DIAGNOSIS — I951 Orthostatic hypotension: Secondary | ICD-10-CM | POA: Diagnosis not present

## 2022-12-09 DIAGNOSIS — E1140 Type 2 diabetes mellitus with diabetic neuropathy, unspecified: Secondary | ICD-10-CM | POA: Diagnosis not present

## 2022-12-11 DIAGNOSIS — M6281 Muscle weakness (generalized): Secondary | ICD-10-CM | POA: Diagnosis not present

## 2022-12-11 DIAGNOSIS — H8110 Benign paroxysmal vertigo, unspecified ear: Secondary | ICD-10-CM | POA: Diagnosis not present

## 2022-12-11 DIAGNOSIS — R262 Difficulty in walking, not elsewhere classified: Secondary | ICD-10-CM | POA: Diagnosis not present

## 2022-12-11 DIAGNOSIS — R2689 Other abnormalities of gait and mobility: Secondary | ICD-10-CM | POA: Diagnosis not present

## 2022-12-12 ENCOUNTER — Other Ambulatory Visit: Payer: Self-pay | Admitting: Physician Assistant

## 2022-12-16 DIAGNOSIS — R262 Difficulty in walking, not elsewhere classified: Secondary | ICD-10-CM | POA: Diagnosis not present

## 2022-12-16 DIAGNOSIS — H8110 Benign paroxysmal vertigo, unspecified ear: Secondary | ICD-10-CM | POA: Diagnosis not present

## 2022-12-16 DIAGNOSIS — M6281 Muscle weakness (generalized): Secondary | ICD-10-CM | POA: Diagnosis not present

## 2022-12-16 DIAGNOSIS — R2689 Other abnormalities of gait and mobility: Secondary | ICD-10-CM | POA: Diagnosis not present

## 2022-12-17 ENCOUNTER — Ambulatory Visit: Payer: Medicare Other | Attending: Internal Medicine | Admitting: Physician Assistant

## 2022-12-17 ENCOUNTER — Ambulatory Visit (INDEPENDENT_AMBULATORY_CARE_PROVIDER_SITE_OTHER): Payer: Medicare Other | Admitting: Podiatry

## 2022-12-17 ENCOUNTER — Ambulatory Visit: Payer: Medicare Other | Admitting: Diagnostic Neuroimaging

## 2022-12-17 ENCOUNTER — Encounter: Payer: Self-pay | Admitting: Physician Assistant

## 2022-12-17 VITALS — BP 138/66 | HR 61 | Ht 73.0 in | Wt 316.0 lb

## 2022-12-17 DIAGNOSIS — I872 Venous insufficiency (chronic) (peripheral): Secondary | ICD-10-CM | POA: Diagnosis not present

## 2022-12-17 DIAGNOSIS — L84 Corns and callosities: Secondary | ICD-10-CM | POA: Diagnosis not present

## 2022-12-17 DIAGNOSIS — M79675 Pain in left toe(s): Secondary | ICD-10-CM | POA: Diagnosis not present

## 2022-12-17 DIAGNOSIS — I739 Peripheral vascular disease, unspecified: Secondary | ICD-10-CM

## 2022-12-17 DIAGNOSIS — B351 Tinea unguium: Secondary | ICD-10-CM | POA: Diagnosis not present

## 2022-12-17 DIAGNOSIS — I4819 Other persistent atrial fibrillation: Secondary | ICD-10-CM | POA: Diagnosis not present

## 2022-12-17 DIAGNOSIS — N1832 Chronic kidney disease, stage 3b: Secondary | ICD-10-CM | POA: Diagnosis not present

## 2022-12-17 DIAGNOSIS — M79674 Pain in right toe(s): Secondary | ICD-10-CM | POA: Diagnosis not present

## 2022-12-17 NOTE — Progress Notes (Signed)
post recent intervention, recommend repeat study in 6 months.  Patient presents today for follow-up.  He has completed 1 month course of Plavix, he complains of significant bruising while on Plavix and Eliquis, we will stop the  Plavix as recommended in the post cath report.  Otherwise, he has noticed significant improvement in his ability to ambulate.  He had no longer have left lower extremity weakness.  He does walk with a cane to help with balance.  We planned for repeat ABI and lower extremity arterial Doppler in April 2025 prior to follow-up with [name].  Otherwise, he denies any chest pain or shortness of breath.  Recent blood work obtained after his discharge showed stable renal function at 2.2, he is being followed by Dr. Sabra Heck of [facility].  Office exam, he continued to have weak pulses in the lower extremity.  He does not have any nonhealing ulcers in bilateral lower extremity.  ROS:   He denies chest pain, palpitations, dyspnea, pnd, orthopnea, n, v, dizziness, syncope, edema, weight gain, or early satiety. All other systems reviewed and are otherwise negative except as noted above.    Studies Reviewed: .        Cardiac Studies & Procedures     STRESS TESTS  MYOCARDIAL PERFUSION IMAGING 02/07/2022  Interpretation Summary   Findings are consistent with prior myocardial infarction. The study is intermediate risk.   No ST deviation was noted.   LV perfusion is abnormal. There is no evidence of ischemia. There is evidence of infarction. Defect 1: There is a medium defect with mild reduction in uptake present in the apical to basal inferior location(s) that is fixed. There is abnormal wall motion in the defect area. Consistent with infarction.   Left ventricular function is abnormal. Global function is moderately reduced. Nuclear stress EF: 36 %. The left ventricular ejection fraction is moderately decreased (30-44%). End diastolic cavity size is moderately enlarged. End systolic cavity size is moderately enlarged.   Prior study available for comparison.  Fixed inferior perfusion defect with hypokinesis consistent with infarct Moderate LV systolic dysfunction (EF 36%) Intermediate  risk study due to decreased LV systolic function.  No ischemia   ECHOCARDIOGRAM  ECHOCARDIOGRAM COMPLETE 02/21/2022  Narrative ECHOCARDIOGRAM REPORT    Patient Name:   Harold [name] Date of Exam: 02/21/2022 Medical Rec #:  [id number]            Height:       73.0 in Accession #:    [id number]           Weight:       327.2 lb Date of Birth:  01/15/1956            BSA:          2.652 m Patient Age:    66 years             BP:           122/70 mmHg Patient Gender: M                    HR:           100 bpm. Exam Location:  [address]  Procedure: 2D Echo, Cardiac Doppler, Color Doppler and Intracardiac Opacification Agent  Indications:    R94.31 Abnormal EKG; I48.91* Unspecified atrial fibrillation; R06.00 Dyspnea  History:        Patient has prior history of Echocardiogram examinations, most recent 04/28/2012. CHF, CAD, PAD, Signs/Symptoms:Fatigue;  post recent intervention, recommend repeat study in 6 months.  Patient presents today for follow-up.  He has completed 1 month course of Plavix, he complains of significant bruising while on Plavix and Eliquis, we will stop the  Plavix as recommended in the post cath report.  Otherwise, he has noticed significant improvement in his ability to ambulate.  He had no longer have left lower extremity weakness.  He does walk with a cane to help with balance.  We planned for repeat ABI and lower extremity arterial Doppler in April 2025 prior to follow-up with [name].  Otherwise, he denies any chest pain or shortness of breath.  Recent blood work obtained after his discharge showed stable renal function at 2.2, he is being followed by Dr. Sabra Heck of [facility].  Office exam, he continued to have weak pulses in the lower extremity.  He does not have any nonhealing ulcers in bilateral lower extremity.  ROS:   He denies chest pain, palpitations, dyspnea, pnd, orthopnea, n, v, dizziness, syncope, edema, weight gain, or early satiety. All other systems reviewed and are otherwise negative except as noted above.    Studies Reviewed: .        Cardiac Studies & Procedures     STRESS TESTS  MYOCARDIAL PERFUSION IMAGING 02/07/2022  Interpretation Summary   Findings are consistent with prior myocardial infarction. The study is intermediate risk.   No ST deviation was noted.   LV perfusion is abnormal. There is no evidence of ischemia. There is evidence of infarction. Defect 1: There is a medium defect with mild reduction in uptake present in the apical to basal inferior location(s) that is fixed. There is abnormal wall motion in the defect area. Consistent with infarction.   Left ventricular function is abnormal. Global function is moderately reduced. Nuclear stress EF: 36 %. The left ventricular ejection fraction is moderately decreased (30-44%). End diastolic cavity size is moderately enlarged. End systolic cavity size is moderately enlarged.   Prior study available for comparison.  Fixed inferior perfusion defect with hypokinesis consistent with infarct Moderate LV systolic dysfunction (EF 36%) Intermediate  risk study due to decreased LV systolic function.  No ischemia   ECHOCARDIOGRAM  ECHOCARDIOGRAM COMPLETE 02/21/2022  Narrative ECHOCARDIOGRAM REPORT    Patient Name:   Harold [name] Date of Exam: 02/21/2022 Medical Rec #:  [id number]            Height:       73.0 in Accession #:    [id number]           Weight:       327.2 lb Date of Birth:  01/15/1956            BSA:          2.652 m Patient Age:    66 years             BP:           122/70 mmHg Patient Gender: M                    HR:           100 bpm. Exam Location:  [address]  Procedure: 2D Echo, Cardiac Doppler, Color Doppler and Intracardiac Opacification Agent  Indications:    R94.31 Abnormal EKG; I48.91* Unspecified atrial fibrillation; R06.00 Dyspnea  History:        Patient has prior history of Echocardiogram examinations, most recent 04/28/2012. CHF, CAD, PAD, Signs/Symptoms:Fatigue;  Cardiology Office Note:  .   Date:  12/17/2022  ID:  Harold [name], DOB Jul 22, 1955, MRN [id number] PCP: [name], MD  [facility] Providers Cardiologist:  [name], MD Electrophysiologist:  [name], MD  PV Cardiologist:  Lorine Bears, MD  Nephrologist: Dr. Sabra Heck of [facility]    History of Present Illness: Harold [name] Kitchen   Harold [name] is a 67 y.o. male with past medical history of CAD, OSA on CPAP, PAD, carotid artery disease, hypertension, hyperlipidemia, DM2, CKD and atrial fibrillation.  He was seen in 2021 for bilateral calf claudication worse on the right side.  ABI showed right ABI 0.72, left ABI 0.90.  Arterial duplex shows severe disease in the right TP trunk with occluded anterior tibial artery, occluded left anterior tibial artery.  Lower extremity angiography performed in August 2021 showed mild nonobstructive iliac disease.  Imaging of the right lower extremity showed no obstructive disease affect the SFA and the popliteal artery.  Significant calcified stenosis in the TP trunk was occluded anterior tibial artery and the significant disease in the proximal posterior tibial artery which was the dominant vessel below the knee.  He underwent successful balloon angioplasty of the right TP trunk into the posterior tibial artery followed by drug-coated balloon angioplasty of the TP trunk.  Postprocedure, right ABI improved to 0.90.  Normal velocities were noted in the right TP trunk and posterior tibial artery.  He had developed atrial fibrillation in December 2023 during his visit with Dr. Herbie Baltimore.  He has been noticing increased fatigue for 2 months prior to the visit.  He was started on Eliquis.  Heart monitor showed 100% A-fib burden.  He underwent DCCV on 03/13/2022.  He did have less shortness of breath after the cardioversion.  Unfortunately he had recurrent A-fib on follow-up on 03/27/2022, he was seen by Dr. Nelly Laurence will discuss rhythm  control options.  He was weaned off of Abilify and started on amiodarone therapy on 06/24/2022.  He had repeat DCCV on 07/25/2022.  He has a history of chronic venous insufficiency with stasis dermatitis and chronic venous wound in the lower extremity.  He has been followed by wound center at [facility].  Right lower extremity wound has completely healed after lower extremity angiography.    Patient was most recently seen by [name], arterial Doppler obtained prior to the office visit showed normal ABI on the right side, mildly reduced ABI on the left side, significant stenosis in distal left popliteal artery and proximal anterior tibial artery, peroneal artery were occluded bilaterally.  Repeat lower extremity angiography on 11/06/2022 showed no significant aortoiliac disease, mild distal left SFA disease with severely calcified stenosis in the mid popliteal artery and a severely calcified disease affecting the TP trunk into the bifurcation of the posterior tibial and peroneal arteries, anterior tibial artery were occluded proximally.  Patient underwent successful orbital arthrectomy and drug-coated balloon angioplasty of the left proximal posterior tibial artery, TP trunk and popliteal artery.  It was mentioned that the flow to the peroneal artery was improved even though peroneal artery itself was not treated.  Due to underlying CKD, patient was hydrated overnight and kept in the hospital for observation.  It was recommended to continue Plavix and Eliquis without aspirin for 1 month.  I will was was restarted on the following day.  Postprocedure, repeat ABI obtained on 11/26/2022 showed left ABI essentially unchanged at 0.89.  Arterial Doppler showed patent left popliteal and TP trunk  Cardiology Office Note:  .   Date:  12/17/2022  ID:  Harold [name], DOB Jul 22, 1955, MRN [id number] PCP: [name], MD  [facility] Providers Cardiologist:  [name], MD Electrophysiologist:  [name], MD  PV Cardiologist:  Lorine Bears, MD  Nephrologist: Dr. Sabra Heck of [facility]    History of Present Illness: Harold [name] Kitchen   Harold [name] is a 67 y.o. male with past medical history of CAD, OSA on CPAP, PAD, carotid artery disease, hypertension, hyperlipidemia, DM2, CKD and atrial fibrillation.  He was seen in 2021 for bilateral calf claudication worse on the right side.  ABI showed right ABI 0.72, left ABI 0.90.  Arterial duplex shows severe disease in the right TP trunk with occluded anterior tibial artery, occluded left anterior tibial artery.  Lower extremity angiography performed in August 2021 showed mild nonobstructive iliac disease.  Imaging of the right lower extremity showed no obstructive disease affect the SFA and the popliteal artery.  Significant calcified stenosis in the TP trunk was occluded anterior tibial artery and the significant disease in the proximal posterior tibial artery which was the dominant vessel below the knee.  He underwent successful balloon angioplasty of the right TP trunk into the posterior tibial artery followed by drug-coated balloon angioplasty of the TP trunk.  Postprocedure, right ABI improved to 0.90.  Normal velocities were noted in the right TP trunk and posterior tibial artery.  He had developed atrial fibrillation in December 2023 during his visit with Dr. Herbie Baltimore.  He has been noticing increased fatigue for 2 months prior to the visit.  He was started on Eliquis.  Heart monitor showed 100% A-fib burden.  He underwent DCCV on 03/13/2022.  He did have less shortness of breath after the cardioversion.  Unfortunately he had recurrent A-fib on follow-up on 03/27/2022, he was seen by Dr. Nelly Laurence will discuss rhythm  control options.  He was weaned off of Abilify and started on amiodarone therapy on 06/24/2022.  He had repeat DCCV on 07/25/2022.  He has a history of chronic venous insufficiency with stasis dermatitis and chronic venous wound in the lower extremity.  He has been followed by wound center at [facility].  Right lower extremity wound has completely healed after lower extremity angiography.    Patient was most recently seen by [name], arterial Doppler obtained prior to the office visit showed normal ABI on the right side, mildly reduced ABI on the left side, significant stenosis in distal left popliteal artery and proximal anterior tibial artery, peroneal artery were occluded bilaterally.  Repeat lower extremity angiography on 11/06/2022 showed no significant aortoiliac disease, mild distal left SFA disease with severely calcified stenosis in the mid popliteal artery and a severely calcified disease affecting the TP trunk into the bifurcation of the posterior tibial and peroneal arteries, anterior tibial artery were occluded proximally.  Patient underwent successful orbital arthrectomy and drug-coated balloon angioplasty of the left proximal posterior tibial artery, TP trunk and popliteal artery.  It was mentioned that the flow to the peroneal artery was improved even though peroneal artery itself was not treated.  Due to underlying CKD, patient was hydrated overnight and kept in the hospital for observation.  It was recommended to continue Plavix and Eliquis without aspirin for 1 month.  I will was was restarted on the following day.  Postprocedure, repeat ABI obtained on 11/26/2022 showed left ABI essentially unchanged at 0.89.  Arterial Doppler showed patent left popliteal and TP trunk

## 2022-12-17 NOTE — Patient Instructions (Addendum)
Medication Instructions:  STOP PLAVIX *If you need a refill on your cardiac medications before your next appointment, please call your pharmacy*   Lab Work: NO LABS If you have labs (blood work) drawn today and your tests are completely normal, you will receive your results only by: MyChart Message (if you have MyChart) OR A paper copy in the mail If you have any lab test that is abnormal or we need to change your treatment, we will call you to review the results.   Testing/Procedures:[address]- [place] 2025 BEFORE FOLLOW UP  Your physician has requested that you have an ankle brachial index (ABI). During this test an ultrasound and blood pressure cuff are used to evaluate the arteries that supply the arms and legs with blood. Allow thirty minutes for this exam. There are no restrictions or special instructions.   Your physician has requested that you have a lower extremity arterial duplex. This test is an ultrasound of the arteries in the legs or arms. It looks at arterial blood flow in the legs and arms. Allow one hour for Lower and Upper Arterial scans. There are no restrictions or special instructions    Follow-Up: At [facility], you and your health needs are our priority.  As part of our continuing mission to provide you with exceptional heart care, we have created designated Provider Care Teams.  These Care Teams include your primary Cardiologist (physician) and Advanced Practice Providers (APPs -  Physician Assistants and Nurse Practitioners) who all work together to provide you with the care you need, when you need it.     Your next appointment:   6 month(s) AFTER ABI AND LEA IN APRIL 2025  Provider:   Lorine Bears, MD

## 2022-12-18 DIAGNOSIS — R262 Difficulty in walking, not elsewhere classified: Secondary | ICD-10-CM | POA: Diagnosis not present

## 2022-12-18 DIAGNOSIS — R2689 Other abnormalities of gait and mobility: Secondary | ICD-10-CM | POA: Diagnosis not present

## 2022-12-18 DIAGNOSIS — H8110 Benign paroxysmal vertigo, unspecified ear: Secondary | ICD-10-CM | POA: Diagnosis not present

## 2022-12-18 DIAGNOSIS — M6281 Muscle weakness (generalized): Secondary | ICD-10-CM | POA: Diagnosis not present

## 2022-12-18 NOTE — Progress Notes (Signed)
Subjective:  Patient ID: Harold [name], male    DOB: 1955/04/09,  MRN: [id number]  Chief Complaint  Patient presents with   Diabetic Foot Care    Nail trim, has a healing wound on top of right foot, bright red area on top of left foot. Not bleeding. Is currently on plavix and eliquis due to recent angioplasty on left side but will complete course today.     67 y.o. male returns with the above complaint. History confirmed with patient.  Nails and calluses are thickened again.  He notes that previous debridement of both issues have been helpful.  Had recent angiography in his left leg and this has helped  Objective:  Physical Exam: reduced sensation at plantar pulps of toes and feet, large spider veins, varicosities and torturous veins in the lower legs along with venous stasis dermatitis noted and +1 PT and DP pulses bilaterally, both feet are warm and well-perfused.  Dry skin and fissuring plantar heel bilateral.  No ulcerations Left Foot: Moderate to severe hallux valgus with semirigid hammertoes present, mycotic nails x5,   Callus on medial hallux.  Prior ulceration remains healed right Foot: Mild to moderate hallux valgus with semirigid hammertoes present, mycotic nails since 5.  There is a callus on the medial hallux.  Fifth MTP source remains healed      Assessment:   1. Corns and callus   2. Pain due to onychomycosis of toenails of both feet   3. PAD (peripheral artery disease) (HCC) [I73.9]        Plan:  Patient was evaluated and treated and all questions answered.  All symptomatic hyperkeratoses were safely debrided with a sterile #15 blade to patient's level of comfort without incident. We discussed preventative and palliative care of these lesions including supportive and accommodative shoegear, padding, prefabricated and custom molded accommodative orthoses, use of a pumice stone and lotions/creams daily.  Discussed the etiology and treatment options for the  condition in detail with the patient.  Routine regular debridement has been helpful. Recommended debridement of the nails today. Sharp and mechanical debridement performed of all painful and mycotic nails today. Nails debrided in length and thickness using a nail nipper to level of comfort. Discussed treatment options including appropriate shoe gear. Follow up as needed for painful nails.     Return in about 10 weeks (around 02/25/2023) for at risk diabetic foot care.

## 2022-12-24 DIAGNOSIS — E11649 Type 2 diabetes mellitus with hypoglycemia without coma: Secondary | ICD-10-CM | POA: Diagnosis not present

## 2022-12-24 DIAGNOSIS — I10 Essential (primary) hypertension: Secondary | ICD-10-CM | POA: Diagnosis not present

## 2022-12-24 DIAGNOSIS — I482 Chronic atrial fibrillation, unspecified: Secondary | ICD-10-CM | POA: Diagnosis not present

## 2022-12-24 DIAGNOSIS — E1140 Type 2 diabetes mellitus with diabetic neuropathy, unspecified: Secondary | ICD-10-CM | POA: Diagnosis not present

## 2022-12-24 DIAGNOSIS — E785 Hyperlipidemia, unspecified: Secondary | ICD-10-CM | POA: Diagnosis not present

## 2022-12-24 DIAGNOSIS — I5032 Chronic diastolic (congestive) heart failure: Secondary | ICD-10-CM | POA: Diagnosis not present

## 2022-12-24 DIAGNOSIS — I2510 Atherosclerotic heart disease of native coronary artery without angina pectoris: Secondary | ICD-10-CM | POA: Diagnosis not present

## 2022-12-24 DIAGNOSIS — I951 Orthostatic hypotension: Secondary | ICD-10-CM | POA: Diagnosis not present

## 2022-12-24 DIAGNOSIS — Z794 Long term (current) use of insulin: Secondary | ICD-10-CM | POA: Diagnosis not present

## 2022-12-24 DIAGNOSIS — E039 Hypothyroidism, unspecified: Secondary | ICD-10-CM | POA: Diagnosis not present

## 2022-12-30 ENCOUNTER — Other Ambulatory Visit (HOSPITAL_COMMUNITY): Payer: Self-pay

## 2022-12-30 DIAGNOSIS — H8110 Benign paroxysmal vertigo, unspecified ear: Secondary | ICD-10-CM | POA: Diagnosis not present

## 2022-12-30 DIAGNOSIS — R2689 Other abnormalities of gait and mobility: Secondary | ICD-10-CM | POA: Diagnosis not present

## 2022-12-30 DIAGNOSIS — M6281 Muscle weakness (generalized): Secondary | ICD-10-CM | POA: Diagnosis not present

## 2022-12-30 DIAGNOSIS — R262 Difficulty in walking, not elsewhere classified: Secondary | ICD-10-CM | POA: Diagnosis not present

## 2022-12-30 MED ORDER — MOUNJARO 15 MG/0.5ML ~~LOC~~ SOAJ
15.0000 mg | SUBCUTANEOUS | 3 refills | Status: DC
  Filled 2022-12-30: qty 6, 84d supply, fill #0
  Filled 2023-07-28 – 2023-08-05 (×3): qty 6, 84d supply, fill #1

## 2022-12-31 ENCOUNTER — Encounter (HOSPITAL_COMMUNITY): Payer: Self-pay

## 2022-12-31 ENCOUNTER — Ambulatory Visit (HOSPITAL_COMMUNITY): Admission: RE | Admit: 2022-12-31 | Payer: Medicare Other | Source: Ambulatory Visit

## 2023-01-02 ENCOUNTER — Inpatient Hospital Stay (HOSPITAL_COMMUNITY): Payer: Medicare Other | Admitting: Anesthesiology

## 2023-01-02 ENCOUNTER — Emergency Department (HOSPITAL_COMMUNITY): Payer: Medicare Other

## 2023-01-02 ENCOUNTER — Encounter (HOSPITAL_COMMUNITY): Payer: Self-pay | Admitting: Internal Medicine

## 2023-01-02 ENCOUNTER — Encounter (HOSPITAL_COMMUNITY)
Admission: EM | Disposition: A | Discharge status: Skilled Nursing Facility | Payer: Self-pay | Source: Home / Self Care | Attending: Internal Medicine

## 2023-01-02 ENCOUNTER — Inpatient Hospital Stay (HOSPITAL_COMMUNITY)
Admission: EM | Admit: 2023-01-02 | Discharge: 2023-01-07 | DRG: 492 | Disposition: A | Discharge status: Skilled Nursing Facility | Payer: Medicare Other | Attending: Internal Medicine | Admitting: Internal Medicine

## 2023-01-02 ENCOUNTER — Other Ambulatory Visit: Payer: Self-pay

## 2023-01-02 ENCOUNTER — Inpatient Hospital Stay (HOSPITAL_COMMUNITY): Payer: Medicare Other

## 2023-01-02 DIAGNOSIS — S99911A Unspecified injury of right ankle, initial encounter: Secondary | ICD-10-CM | POA: Diagnosis not present

## 2023-01-02 DIAGNOSIS — D6869 Other thrombophilia: Secondary | ICD-10-CM | POA: Diagnosis present

## 2023-01-02 DIAGNOSIS — I5032 Chronic diastolic (congestive) heart failure: Secondary | ICD-10-CM | POA: Diagnosis not present

## 2023-01-02 DIAGNOSIS — S8251XA Displaced fracture of medial malleolus of right tibia, initial encounter for closed fracture: Secondary | ICD-10-CM | POA: Diagnosis not present

## 2023-01-02 DIAGNOSIS — S85311A Laceration of greater saphenous vein at lower leg level, right leg, initial encounter: Secondary | ICD-10-CM | POA: Diagnosis present

## 2023-01-02 DIAGNOSIS — S93431A Sprain of tibiofibular ligament of right ankle, initial encounter: Secondary | ICD-10-CM | POA: Diagnosis not present

## 2023-01-02 DIAGNOSIS — S82891C Other fracture of right lower leg, initial encounter for open fracture type IIIA, IIIB, or IIIC: Principal | ICD-10-CM

## 2023-01-02 DIAGNOSIS — E559 Vitamin D deficiency, unspecified: Secondary | ICD-10-CM | POA: Diagnosis not present

## 2023-01-02 DIAGNOSIS — I700 Atherosclerosis of aorta: Secondary | ICD-10-CM | POA: Diagnosis not present

## 2023-01-02 DIAGNOSIS — S82301A Unspecified fracture of lower end of right tibia, initial encounter for closed fracture: Secondary | ICD-10-CM | POA: Diagnosis not present

## 2023-01-02 DIAGNOSIS — E1122 Type 2 diabetes mellitus with diabetic chronic kidney disease: Secondary | ICD-10-CM | POA: Diagnosis present

## 2023-01-02 DIAGNOSIS — M6281 Muscle weakness (generalized): Secondary | ICD-10-CM | POA: Diagnosis not present

## 2023-01-02 DIAGNOSIS — M898X9 Other specified disorders of bone, unspecified site: Secondary | ICD-10-CM | POA: Diagnosis present

## 2023-01-02 DIAGNOSIS — S82851C Displaced trimalleolar fracture of right lower leg, initial encounter for open fracture type IIIA, IIIB, or IIIC: Secondary | ICD-10-CM | POA: Diagnosis not present

## 2023-01-02 DIAGNOSIS — S82851B Displaced trimalleolar fracture of right lower leg, initial encounter for open fracture type I or II: Secondary | ICD-10-CM | POA: Diagnosis not present

## 2023-01-02 DIAGNOSIS — I509 Heart failure, unspecified: Secondary | ICD-10-CM | POA: Diagnosis not present

## 2023-01-02 DIAGNOSIS — Z6841 Body Mass Index (BMI) 40.0 and over, adult: Secondary | ICD-10-CM | POA: Diagnosis not present

## 2023-01-02 DIAGNOSIS — Z7901 Long term (current) use of anticoagulants: Secondary | ICD-10-CM

## 2023-01-02 DIAGNOSIS — W06XXXA Fall from bed, initial encounter: Secondary | ICD-10-CM | POA: Diagnosis present

## 2023-01-02 DIAGNOSIS — I480 Paroxysmal atrial fibrillation: Secondary | ICD-10-CM | POA: Diagnosis present

## 2023-01-02 DIAGNOSIS — N183 Chronic kidney disease, stage 3 unspecified: Secondary | ICD-10-CM | POA: Diagnosis not present

## 2023-01-02 DIAGNOSIS — S9304XA Dislocation of right ankle joint, initial encounter: Secondary | ICD-10-CM | POA: Diagnosis not present

## 2023-01-02 DIAGNOSIS — R296 Repeated falls: Secondary | ICD-10-CM | POA: Diagnosis present

## 2023-01-02 DIAGNOSIS — I10 Essential (primary) hypertension: Secondary | ICD-10-CM | POA: Diagnosis present

## 2023-01-02 DIAGNOSIS — I272 Pulmonary hypertension, unspecified: Secondary | ICD-10-CM | POA: Diagnosis not present

## 2023-01-02 DIAGNOSIS — I130 Hypertensive heart and chronic kidney disease with heart failure and stage 1 through stage 4 chronic kidney disease, or unspecified chronic kidney disease: Secondary | ICD-10-CM | POA: Diagnosis present

## 2023-01-02 DIAGNOSIS — S82891E Other fracture of right lower leg, subsequent encounter for open fracture type I or II with routine healing: Secondary | ICD-10-CM | POA: Diagnosis not present

## 2023-01-02 DIAGNOSIS — Z23 Encounter for immunization: Secondary | ICD-10-CM

## 2023-01-02 DIAGNOSIS — E8779 Other fluid overload: Secondary | ICD-10-CM | POA: Diagnosis not present

## 2023-01-02 DIAGNOSIS — Y92009 Unspecified place in unspecified non-institutional (private) residence as the place of occurrence of the external cause: Secondary | ICD-10-CM

## 2023-01-02 DIAGNOSIS — Z01818 Encounter for other preprocedural examination: Secondary | ICD-10-CM | POA: Diagnosis not present

## 2023-01-02 DIAGNOSIS — N189 Chronic kidney disease, unspecified: Secondary | ICD-10-CM

## 2023-01-02 DIAGNOSIS — E1169 Type 2 diabetes mellitus with other specified complication: Secondary | ICD-10-CM | POA: Diagnosis present

## 2023-01-02 DIAGNOSIS — I739 Peripheral vascular disease, unspecified: Secondary | ICD-10-CM | POA: Diagnosis present

## 2023-01-02 DIAGNOSIS — D649 Anemia, unspecified: Secondary | ICD-10-CM | POA: Diagnosis not present

## 2023-01-02 DIAGNOSIS — Z79899 Other long term (current) drug therapy: Secondary | ICD-10-CM

## 2023-01-02 DIAGNOSIS — F32A Depression, unspecified: Secondary | ICD-10-CM | POA: Diagnosis present

## 2023-01-02 DIAGNOSIS — E872 Acidosis, unspecified: Secondary | ICD-10-CM | POA: Diagnosis present

## 2023-01-02 DIAGNOSIS — E1151 Type 2 diabetes mellitus with diabetic peripheral angiopathy without gangrene: Secondary | ICD-10-CM | POA: Diagnosis present

## 2023-01-02 DIAGNOSIS — S82899D Other fracture of unspecified lower leg, subsequent encounter for closed fracture with routine healing: Secondary | ICD-10-CM | POA: Diagnosis not present

## 2023-01-02 DIAGNOSIS — S82891B Other fracture of right lower leg, initial encounter for open fracture type I or II: Secondary | ICD-10-CM | POA: Diagnosis not present

## 2023-01-02 DIAGNOSIS — D62 Acute posthemorrhagic anemia: Secondary | ICD-10-CM | POA: Diagnosis not present

## 2023-01-02 DIAGNOSIS — Z79890 Hormone replacement therapy (postmenopausal): Secondary | ICD-10-CM

## 2023-01-02 DIAGNOSIS — I2510 Atherosclerotic heart disease of native coronary artery without angina pectoris: Secondary | ICD-10-CM | POA: Diagnosis not present

## 2023-01-02 DIAGNOSIS — S82891A Other fracture of right lower leg, initial encounter for closed fracture: Secondary | ICD-10-CM | POA: Diagnosis not present

## 2023-01-02 DIAGNOSIS — I517 Cardiomegaly: Secondary | ICD-10-CM | POA: Diagnosis not present

## 2023-01-02 DIAGNOSIS — E1142 Type 2 diabetes mellitus with diabetic polyneuropathy: Secondary | ICD-10-CM | POA: Diagnosis not present

## 2023-01-02 DIAGNOSIS — E876 Hypokalemia: Secondary | ICD-10-CM | POA: Diagnosis present

## 2023-01-02 DIAGNOSIS — Z9889 Other specified postprocedural states: Secondary | ICD-10-CM | POA: Diagnosis not present

## 2023-01-02 DIAGNOSIS — S82451A Displaced comminuted fracture of shaft of right fibula, initial encounter for closed fracture: Secondary | ICD-10-CM | POA: Diagnosis not present

## 2023-01-02 DIAGNOSIS — F419 Anxiety disorder, unspecified: Secondary | ICD-10-CM | POA: Diagnosis present

## 2023-01-02 DIAGNOSIS — Z7985 Long-term (current) use of injectable non-insulin antidiabetic drugs: Secondary | ICD-10-CM

## 2023-01-02 DIAGNOSIS — N170 Acute kidney failure with tubular necrosis: Secondary | ICD-10-CM | POA: Diagnosis present

## 2023-01-02 DIAGNOSIS — M109 Gout, unspecified: Secondary | ICD-10-CM | POA: Diagnosis present

## 2023-01-02 DIAGNOSIS — S82401A Unspecified fracture of shaft of right fibula, initial encounter for closed fracture: Secondary | ICD-10-CM | POA: Diagnosis not present

## 2023-01-02 DIAGNOSIS — Z881 Allergy status to other antibiotic agents status: Secondary | ICD-10-CM

## 2023-01-02 DIAGNOSIS — T797XXA Traumatic subcutaneous emphysema, initial encounter: Secondary | ICD-10-CM | POA: Diagnosis not present

## 2023-01-02 DIAGNOSIS — W19XXXA Unspecified fall, initial encounter: Secondary | ICD-10-CM

## 2023-01-02 DIAGNOSIS — Z794 Long term (current) use of insulin: Secondary | ICD-10-CM | POA: Diagnosis not present

## 2023-01-02 DIAGNOSIS — D72829 Elevated white blood cell count, unspecified: Secondary | ICD-10-CM | POA: Diagnosis not present

## 2023-01-02 DIAGNOSIS — R079 Chest pain, unspecified: Secondary | ICD-10-CM | POA: Diagnosis not present

## 2023-01-02 DIAGNOSIS — S82899B Other fracture of unspecified lower leg, initial encounter for open fracture type I or II: Secondary | ICD-10-CM | POA: Diagnosis present

## 2023-01-02 DIAGNOSIS — I951 Orthostatic hypotension: Secondary | ICD-10-CM | POA: Diagnosis present

## 2023-01-02 DIAGNOSIS — Z87891 Personal history of nicotine dependence: Secondary | ICD-10-CM

## 2023-01-02 DIAGNOSIS — R278 Other lack of coordination: Secondary | ICD-10-CM | POA: Diagnosis not present

## 2023-01-02 DIAGNOSIS — R9431 Abnormal electrocardiogram [ECG] [EKG]: Secondary | ICD-10-CM | POA: Diagnosis not present

## 2023-01-02 DIAGNOSIS — I5030 Unspecified diastolic (congestive) heart failure: Secondary | ICD-10-CM | POA: Diagnosis not present

## 2023-01-02 DIAGNOSIS — S8291XB Unspecified fracture of right lower leg, initial encounter for open fracture type I or II: Secondary | ICD-10-CM | POA: Diagnosis not present

## 2023-01-02 DIAGNOSIS — E1165 Type 2 diabetes mellitus with hyperglycemia: Secondary | ICD-10-CM | POA: Diagnosis present

## 2023-01-02 DIAGNOSIS — R6889 Other general symptoms and signs: Secondary | ICD-10-CM | POA: Diagnosis not present

## 2023-01-02 DIAGNOSIS — N1832 Chronic kidney disease, stage 3b: Secondary | ICD-10-CM | POA: Diagnosis not present

## 2023-01-02 DIAGNOSIS — E11649 Type 2 diabetes mellitus with hypoglycemia without coma: Secondary | ICD-10-CM | POA: Diagnosis not present

## 2023-01-02 DIAGNOSIS — Z882 Allergy status to sulfonamides status: Secondary | ICD-10-CM

## 2023-01-02 DIAGNOSIS — E039 Hypothyroidism, unspecified: Secondary | ICD-10-CM | POA: Diagnosis not present

## 2023-01-02 DIAGNOSIS — S92101A Unspecified fracture of right talus, initial encounter for closed fracture: Secondary | ICD-10-CM | POA: Diagnosis not present

## 2023-01-02 DIAGNOSIS — Z95820 Peripheral vascular angioplasty status with implants and grafts: Secondary | ICD-10-CM | POA: Diagnosis not present

## 2023-01-02 DIAGNOSIS — N261 Atrophy of kidney (terminal): Secondary | ICD-10-CM | POA: Diagnosis not present

## 2023-01-02 DIAGNOSIS — G4733 Obstructive sleep apnea (adult) (pediatric): Secondary | ICD-10-CM | POA: Diagnosis present

## 2023-01-02 DIAGNOSIS — N184 Chronic kidney disease, stage 4 (severe): Secondary | ICD-10-CM | POA: Diagnosis present

## 2023-01-02 DIAGNOSIS — S82451D Displaced comminuted fracture of shaft of right fibula, subsequent encounter for closed fracture with routine healing: Secondary | ICD-10-CM | POA: Diagnosis not present

## 2023-01-02 DIAGNOSIS — R404 Transient alteration of awareness: Secondary | ICD-10-CM | POA: Diagnosis not present

## 2023-01-02 DIAGNOSIS — S82851E Displaced trimalleolar fracture of right lower leg, subsequent encounter for open fracture type I or II with routine healing: Secondary | ICD-10-CM | POA: Diagnosis not present

## 2023-01-02 DIAGNOSIS — G8918 Other acute postprocedural pain: Secondary | ICD-10-CM | POA: Diagnosis not present

## 2023-01-02 DIAGNOSIS — I5042 Chronic combined systolic (congestive) and diastolic (congestive) heart failure: Secondary | ICD-10-CM | POA: Diagnosis not present

## 2023-01-02 DIAGNOSIS — J449 Chronic obstructive pulmonary disease, unspecified: Secondary | ICD-10-CM | POA: Diagnosis not present

## 2023-01-02 DIAGNOSIS — Z888 Allergy status to other drugs, medicaments and biological substances status: Secondary | ICD-10-CM

## 2023-01-02 DIAGNOSIS — Z9861 Coronary angioplasty status: Secondary | ICD-10-CM

## 2023-01-02 DIAGNOSIS — E66813 Obesity, class 3: Secondary | ICD-10-CM | POA: Diagnosis present

## 2023-01-02 DIAGNOSIS — R2689 Other abnormalities of gait and mobility: Secondary | ICD-10-CM | POA: Diagnosis not present

## 2023-01-02 DIAGNOSIS — Z880 Allergy status to penicillin: Secondary | ICD-10-CM

## 2023-01-02 DIAGNOSIS — N179 Acute kidney failure, unspecified: Secondary | ICD-10-CM | POA: Diagnosis present

## 2023-01-02 DIAGNOSIS — E785 Hyperlipidemia, unspecified: Secondary | ICD-10-CM | POA: Diagnosis present

## 2023-01-02 DIAGNOSIS — Z955 Presence of coronary angioplasty implant and graft: Secondary | ICD-10-CM

## 2023-01-02 DIAGNOSIS — Z743 Need for continuous supervision: Secondary | ICD-10-CM | POA: Diagnosis not present

## 2023-01-02 DIAGNOSIS — Z9181 History of falling: Secondary | ICD-10-CM | POA: Diagnosis not present

## 2023-01-02 DIAGNOSIS — S82891D Other fracture of right lower leg, subsequent encounter for closed fracture with routine healing: Secondary | ICD-10-CM | POA: Diagnosis not present

## 2023-01-02 DIAGNOSIS — S86999A Other injury of unspecified muscle(s) and tendon(s) at lower leg level, unspecified leg, initial encounter: Secondary | ICD-10-CM | POA: Diagnosis not present

## 2023-01-02 DIAGNOSIS — K219 Gastro-esophageal reflux disease without esophagitis: Secondary | ICD-10-CM | POA: Diagnosis present

## 2023-01-02 HISTORY — DX: Unspecified place in unspecified non-institutional (private) residence as the place of occurrence of the external cause: W19.XXXA

## 2023-01-02 HISTORY — DX: Other fracture of unspecified lower leg, initial encounter for open fracture type I or II: S82.899B

## 2023-01-02 HISTORY — PX: I & D EXTREMITY: SHX5045

## 2023-01-02 HISTORY — PX: ORIF ANKLE FRACTURE: SHX5408

## 2023-01-02 LAB — HEMOGLOBIN A1C
Hgb A1c MFr Bld: 7 % — ABNORMAL HIGH (ref 4.8–5.6)
Mean Plasma Glucose: 154.2 mg/dL

## 2023-01-02 LAB — TYPE AND SCREEN
ABO/RH(D): A POS
Antibody Screen: NEGATIVE

## 2023-01-02 LAB — COMPREHENSIVE METABOLIC PANEL
ALT: 14 U/L (ref 0–44)
AST: 20 U/L (ref 15–41)
Albumin: 2.6 g/dL — ABNORMAL LOW (ref 3.5–5.0)
Alkaline Phosphatase: 71 U/L (ref 38–126)
Anion gap: 14 (ref 5–15)
BUN: 40 mg/dL — ABNORMAL HIGH (ref 8–23)
CO2: 19 mmol/L — ABNORMAL LOW (ref 22–32)
Calcium: 7.9 mg/dL — ABNORMAL LOW (ref 8.9–10.3)
Chloride: 100 mmol/L (ref 98–111)
Creatinine, Ser: 3.73 mg/dL — ABNORMAL HIGH (ref 0.61–1.24)
GFR, Estimated: 17 mL/min — ABNORMAL LOW (ref >60)
Glucose, Bld: 300 mg/dL — ABNORMAL HIGH (ref 70–99)
Potassium: 3.7 mmol/L (ref 3.5–5.1)
Sodium: 133 mmol/L — ABNORMAL LOW (ref 135–145)
Total Bilirubin: 0.7 mg/dL (ref <1.2)
Total Protein: 4.9 g/dL — ABNORMAL LOW (ref 6.5–8.1)

## 2023-01-02 LAB — CBC
HCT: 34.8 % — ABNORMAL LOW (ref 39.0–52.0)
Hemoglobin: 12.2 g/dL — ABNORMAL LOW (ref 13.0–17.0)
MCH: 31.4 pg (ref 26.0–34.0)
MCHC: 35.1 g/dL (ref 30.0–36.0)
MCV: 89.5 fL (ref 80.0–100.0)
Platelets: 202 10*3/uL (ref 150–400)
RBC: 3.89 MIL/uL — ABNORMAL LOW (ref 4.22–5.81)
RDW: 13.8 % (ref 11.5–15.5)
WBC: 15.8 10*3/uL — ABNORMAL HIGH (ref 4.0–10.5)
nRBC: 0 % (ref 0.0–0.2)

## 2023-01-02 LAB — PROTIME-INR
INR: 1.3 — ABNORMAL HIGH (ref 0.8–1.2)
Prothrombin Time: 16.1 s — ABNORMAL HIGH (ref 11.4–15.2)

## 2023-01-02 LAB — CK: Total CK: 251 U/L (ref 49–397)

## 2023-01-02 LAB — SURGICAL PCR SCREEN
MRSA, PCR: POSITIVE — AB
Staphylococcus aureus: POSITIVE — AB

## 2023-01-02 LAB — HIV ANTIBODY (ROUTINE TESTING W REFLEX): HIV Screen 4th Generation wRfx: NONREACTIVE

## 2023-01-02 LAB — GLUCOSE, CAPILLARY
Glucose-Capillary: 274 mg/dL — ABNORMAL HIGH (ref 70–99)
Glucose-Capillary: 273 mg/dL — ABNORMAL HIGH (ref 70–99)
Glucose-Capillary: 272 mg/dL — ABNORMAL HIGH (ref 70–99)
Glucose-Capillary: 322 mg/dL — ABNORMAL HIGH (ref 70–99)

## 2023-01-02 LAB — CBG MONITORING, ED: Glucose-Capillary: 271 mg/dL — ABNORMAL HIGH (ref 70–99)

## 2023-01-02 LAB — TSH: TSH: 3.387 u[IU]/mL (ref 0.350–4.500)

## 2023-01-02 LAB — SAMPLE TO BLOOD BANK

## 2023-01-02 SURGERY — IRRIGATION AND DEBRIDEMENT EXTREMITY
Anesthesia: General | Site: Ankle | Laterality: Right

## 2023-01-02 MED ORDER — ONDANSETRON HCL 4 MG/2ML IJ SOLN
INTRAMUSCULAR | Status: DC | PRN
  Administered 2023-01-02: 4 mg via INTRAVENOUS

## 2023-01-02 MED ORDER — PANTOPRAZOLE SODIUM 40 MG PO TBEC
40.0000 mg | DELAYED_RELEASE_TABLET | Freq: Every day | ORAL | Status: DC
  Administered 2023-01-02 – 2023-01-07 (×6): 40 mg via ORAL
  Filled 2023-01-02 (×6): qty 1

## 2023-01-02 MED ORDER — CHLORHEXIDINE GLUCONATE 0.12 % MT SOLN
OROMUCOSAL | Status: AC
  Filled 2023-01-02: qty 15

## 2023-01-02 MED ORDER — PHENYLEPHRINE 80 MCG/ML (10ML) SYRINGE FOR IV PUSH (FOR BLOOD PRESSURE SUPPORT)
PREFILLED_SYRINGE | INTRAVENOUS | Status: AC
Start: 2023-01-02 — End: ?
  Filled 2023-01-02: qty 10

## 2023-01-02 MED ORDER — OXYCODONE HCL 5 MG/5ML PO SOLN: 5.0000 mg | Freq: Once | ORAL | Status: DC | PRN

## 2023-01-02 MED ORDER — AMIODARONE HCL 200 MG PO TABS
200.0000 mg | ORAL_TABLET | Freq: Every day | ORAL | Status: DC
  Administered 2023-01-02 – 2023-01-07 (×6): 200 mg via ORAL
  Filled 2023-01-02 (×6): qty 1

## 2023-01-02 MED ORDER — ONDANSETRON HCL 4 MG/2ML IJ SOLN
INTRAMUSCULAR | Status: AC
  Filled 2023-01-02: qty 2

## 2023-01-02 MED ORDER — TRIMETHOBENZAMIDE HCL 100 MG/ML IM SOLN
200.0000 mg | Freq: Four times a day (QID) | INTRAMUSCULAR | Status: DC | PRN
  Filled 2023-01-02: qty 2

## 2023-01-02 MED ORDER — INSULIN ASPART 100 UNIT/ML IJ SOLN
0.0000 [IU] | Freq: Three times a day (TID) | INTRAMUSCULAR | Status: DC
  Administered 2023-01-02: 11 [IU] via SUBCUTANEOUS
  Administered 2023-01-03 (×2): 20 [IU] via SUBCUTANEOUS

## 2023-01-02 MED ORDER — INSULIN ASPART 100 UNIT/ML IJ SOLN
0.0000 [IU] | INTRAMUSCULAR | Status: DC | PRN
  Administered 2023-01-02: 8 [IU] via SUBCUTANEOUS

## 2023-01-02 MED ORDER — FENTANYL CITRATE (PF) 250 MCG/5ML IJ SOLN
INTRAMUSCULAR | Status: AC
  Filled 2023-01-02: qty 5

## 2023-01-02 MED ORDER — SODIUM CHLORIDE 0.9% FLUSH
3.0000 mL | Freq: Two times a day (BID) | INTRAVENOUS | Status: DC
  Administered 2023-01-02 – 2023-01-07 (×10): 3 mL via INTRAVENOUS

## 2023-01-02 MED ORDER — EPHEDRINE SULFATE-NACL 50-0.9 MG/10ML-% IV SOSY
PREFILLED_SYRINGE | INTRAVENOUS | Status: DC | PRN
  Administered 2023-01-02 (×2): 5 mg via INTRAVENOUS

## 2023-01-02 MED ORDER — FENTANYL CITRATE (PF) 100 MCG/2ML IJ SOLN
INTRAMUSCULAR | Status: AC
  Administered 2023-01-02: 50 ug via INTRAVENOUS
  Filled 2023-01-02: qty 2

## 2023-01-02 MED ORDER — ACETAMINOPHEN 650 MG RE SUPP: 650.0000 mg | Freq: Four times a day (QID) | RECTAL | Status: DC | PRN

## 2023-01-02 MED ORDER — ROPIVACAINE HCL 5 MG/ML IJ SOLN
INTRAMUSCULAR | Status: DC | PRN
  Administered 2023-01-02: 30 mL via PERINEURAL

## 2023-01-02 MED ORDER — VANCOMYCIN HCL 1000 MG IV SOLR
INTRAVENOUS | Status: DC | PRN
  Administered 2023-01-02: 1000 mg via INTRAVENOUS

## 2023-01-02 MED ORDER — TETANUS-DIPHTH-ACELL PERTUSSIS 5-2.5-18.5 LF-MCG/0.5 IM SUSY
0.5000 mL | PREFILLED_SYRINGE | Freq: Once | INTRAMUSCULAR | Status: AC
  Administered 2023-01-02: 0.5 mL via INTRAMUSCULAR

## 2023-01-02 MED ORDER — SCOPOLAMINE 1 MG/3DAYS TD PT72
MEDICATED_PATCH | TRANSDERMAL | Status: DC | PRN
  Administered 2023-01-02: 1 via TRANSDERMAL

## 2023-01-02 MED ORDER — CARVEDILOL 3.125 MG PO TABS
3.1250 mg | ORAL_TABLET | Freq: Once | ORAL | Status: AC
  Administered 2023-01-02: 3.125 mg via ORAL
  Filled 2023-01-02: qty 1

## 2023-01-02 MED ORDER — FENTANYL CITRATE (PF) 100 MCG/2ML IJ SOLN: 50.0000 ug | Freq: Once | INTRAMUSCULAR | Status: AC

## 2023-01-02 MED ORDER — TRANEXAMIC ACID-NACL 1000-0.7 MG/100ML-% IV SOLN
1000.0000 mg | INTRAVENOUS | Status: AC
  Administered 2023-01-02: 1000 mg via INTRAVENOUS
  Filled 2023-01-02: qty 100

## 2023-01-02 MED ORDER — CEFAZOLIN SODIUM-DEXTROSE 2-4 GM/100ML-% IV SOLN
2.0000 g | Freq: Once | INTRAVENOUS | Status: AC
  Administered 2023-01-02: 2 g via INTRAVENOUS

## 2023-01-02 MED ORDER — VANCOMYCIN HCL 1000 MG IV SOLR
INTRAVENOUS | Status: AC
  Filled 2023-01-02: qty 20

## 2023-01-02 MED ORDER — EPHEDRINE 5 MG/ML INJ
INTRAVENOUS | Status: AC
  Filled 2023-01-02: qty 5

## 2023-01-02 MED ORDER — CLONIDINE HCL (ANALGESIA) 100 MCG/ML EP SOLN
EPIDURAL | Status: DC | PRN
  Administered 2023-01-02: 100 ug

## 2023-01-02 MED ORDER — CHLORHEXIDINE GLUCONATE 0.12 % MT SOLN: 15.0000 mL | OROMUCOSAL | Status: DC

## 2023-01-02 MED ORDER — LIDOCAINE 2% (20 MG/ML) 5 ML SYRINGE
INTRAMUSCULAR | Status: AC
  Filled 2023-01-02: qty 5

## 2023-01-02 MED ORDER — SCOPOLAMINE 1 MG/3DAYS TD PT72
MEDICATED_PATCH | TRANSDERMAL | Status: AC
  Filled 2023-01-02: qty 1

## 2023-01-02 MED ORDER — CARVEDILOL 3.125 MG PO TABS
3.1250 mg | ORAL_TABLET | Freq: Two times a day (BID) | ORAL | Status: DC
  Administered 2023-01-02 – 2023-01-07 (×10): 3.125 mg via ORAL
  Filled 2023-01-02 (×10): qty 1

## 2023-01-02 MED ORDER — HYDROCODONE-ACETAMINOPHEN 5-325 MG PO TABS
1.0000 | ORAL_TABLET | Freq: Four times a day (QID) | ORAL | Status: DC | PRN
Start: 2023-01-02 — End: 2023-01-07
  Administered 2023-01-04 – 2023-01-07 (×7): 1 via ORAL
  Filled 2023-01-02 (×9): qty 1

## 2023-01-02 MED ORDER — ALBUTEROL SULFATE (2.5 MG/3ML) 0.083% IN NEBU
2.5000 mg | INHALATION_SOLUTION | Freq: Four times a day (QID) | RESPIRATORY_TRACT | Status: DC | PRN
Start: 2023-01-02 — End: 2023-01-07

## 2023-01-02 MED ORDER — GABAPENTIN 400 MG PO CAPS
1600.0000 mg | ORAL_CAPSULE | Freq: Two times a day (BID) | ORAL | Status: DC
  Administered 2023-01-02 – 2023-01-03 (×2): 1600 mg via ORAL
  Filled 2023-01-02 (×2): qty 4

## 2023-01-02 MED ORDER — LIDOCAINE 2% (20 MG/ML) 5 ML SYRINGE
INTRAMUSCULAR | Status: DC | PRN
  Administered 2023-01-02: 80 mg via INTRAVENOUS

## 2023-01-02 MED ORDER — MIDAZOLAM HCL 2 MG/2ML IJ SOLN
INTRAMUSCULAR | Status: AC
  Administered 2023-01-02: 1 mg via INTRAVENOUS
  Filled 2023-01-02: qty 2

## 2023-01-02 MED ORDER — CHLORHEXIDINE GLUCONATE 4 % EX SOLN: 60.0000 mL | Freq: Once | CUTANEOUS | Status: DC

## 2023-01-02 MED ORDER — PROPOFOL 1000 MG/100ML IV EMUL
INTRAVENOUS | Status: AC
Start: 2023-01-02 — End: ?
  Filled 2023-01-02: qty 100

## 2023-01-02 MED ORDER — ALBUMIN HUMAN 5 % IV SOLN
12.5000 g | Freq: Once | INTRAVENOUS | Status: AC
  Administered 2023-01-02: 12.5 g via INTRAVENOUS

## 2023-01-02 MED ORDER — SUGAMMADEX SODIUM 200 MG/2ML IV SOLN
INTRAVENOUS | Status: DC | PRN
  Administered 2023-01-02: 200 mg via INTRAVENOUS

## 2023-01-02 MED ORDER — ACETAMINOPHEN 325 MG PO TABS
650.0000 mg | ORAL_TABLET | Freq: Four times a day (QID) | ORAL | Status: DC | PRN
  Administered 2023-01-03 – 2023-01-06 (×4): 650 mg via ORAL
  Filled 2023-01-02 (×4): qty 2

## 2023-01-02 MED ORDER — ROCURONIUM BROMIDE 10 MG/ML (PF) SYRINGE
PREFILLED_SYRINGE | INTRAVENOUS | Status: DC | PRN
  Administered 2023-01-02: 60 mg via INTRAVENOUS
  Administered 2023-01-02: 10 mg via INTRAVENOUS
  Administered 2023-01-02 (×2): 20 mg via INTRAVENOUS

## 2023-01-02 MED ORDER — ALLOPURINOL 100 MG PO TABS
100.0000 mg | ORAL_TABLET | Freq: Every day | ORAL | Status: DC
  Administered 2023-01-02 – 2023-01-07 (×6): 100 mg via ORAL
  Filled 2023-01-02 (×6): qty 1

## 2023-01-02 MED ORDER — ORAL CARE MOUTH RINSE: 15.0000 mL | Freq: Once | OROMUCOSAL | Status: DC

## 2023-01-02 MED ORDER — DEXAMETHASONE SODIUM PHOSPHATE 10 MG/ML IJ SOLN
INTRAMUSCULAR | Status: DC | PRN
  Administered 2023-01-02: 10 mg

## 2023-01-02 MED ORDER — MIDAZOLAM HCL 2 MG/2ML IJ SOLN: 1.0000 mg | Freq: Once | INTRAMUSCULAR | Status: AC

## 2023-01-02 MED ORDER — ACETAMINOPHEN 10 MG/ML IV SOLN
1000.0000 mg | Freq: Once | INTRAVENOUS | Status: DC | PRN
Start: 2023-01-02 — End: 2023-01-02

## 2023-01-02 MED ORDER — PHENYLEPHRINE HCL-NACL 20-0.9 MG/250ML-% IV SOLN
INTRAVENOUS | Status: DC | PRN
  Administered 2023-01-02: 60 ug/min via INTRAVENOUS

## 2023-01-02 MED ORDER — MIDAZOLAM HCL 2 MG/2ML IJ SOLN
INTRAMUSCULAR | Status: AC
  Filled 2023-01-02: qty 2

## 2023-01-02 MED ORDER — ALBUMIN HUMAN 5 % IV SOLN: INTRAVENOUS | Status: DC | PRN

## 2023-01-02 MED ORDER — HYDRALAZINE HCL 20 MG/ML IJ SOLN
10.0000 mg | INTRAMUSCULAR | Status: DC | PRN
  Administered 2023-01-07: 10 mg via INTRAVENOUS
  Filled 2023-01-02: qty 1

## 2023-01-02 MED ORDER — PROPOFOL 10 MG/ML IV BOLUS
INTRAVENOUS | Status: AC
  Filled 2023-01-02: qty 20

## 2023-01-02 MED ORDER — DEXAMETHASONE SODIUM PHOSPHATE 10 MG/ML IJ SOLN
INTRAMUSCULAR | Status: AC
  Filled 2023-01-02: qty 1

## 2023-01-02 MED ORDER — SODIUM CHLORIDE 0.9 % IV SOLN
2.0000 g | INTRAVENOUS | Status: AC
Start: 2023-01-02 — End: 2023-01-04
  Administered 2023-01-02 – 2023-01-04 (×3): 2 g via INTRAVENOUS
  Filled 2023-01-02 (×3): qty 20

## 2023-01-02 MED ORDER — PROPOFOL 10 MG/ML IV BOLUS
INTRAVENOUS | Status: DC | PRN
  Administered 2023-01-02: 100 mg via INTRAVENOUS
  Administered 2023-01-02: 30 mg via INTRAVENOUS

## 2023-01-02 MED ORDER — ARIPIPRAZOLE 5 MG PO TABS
5.0000 mg | ORAL_TABLET | Freq: Every day | ORAL | Status: DC
  Administered 2023-01-02 – 2023-01-07 (×6): 5 mg via ORAL
  Filled 2023-01-02 (×6): qty 1

## 2023-01-02 MED ORDER — OXYCODONE HCL 5 MG PO TABS: 5.0000 mg | ORAL_TABLET | Freq: Once | ORAL | Status: DC | PRN

## 2023-01-02 MED ORDER — ROSUVASTATIN CALCIUM 20 MG PO TABS
20.0000 mg | ORAL_TABLET | Freq: Every evening | ORAL | Status: DC
  Administered 2023-01-02: 20 mg via ORAL
  Filled 2023-01-02: qty 1

## 2023-01-02 MED ORDER — EZETIMIBE 10 MG PO TABS
10.0000 mg | ORAL_TABLET | Freq: Every day | ORAL | Status: DC
  Administered 2023-01-02 – 2023-01-07 (×6): 10 mg via ORAL
  Filled 2023-01-02 (×6): qty 1

## 2023-01-02 MED ORDER — FENTANYL CITRATE PF 50 MCG/ML IJ SOSY
25.0000 ug | PREFILLED_SYRINGE | Freq: Once | INTRAMUSCULAR | Status: AC
  Administered 2023-01-02: 25 ug via INTRAVENOUS
  Filled 2023-01-02: qty 1

## 2023-01-02 MED ORDER — DEXAMETHASONE SODIUM PHOSPHATE 10 MG/ML IJ SOLN
INTRAMUSCULAR | Status: DC | PRN
  Administered 2023-01-02: 10 mg via INTRAVENOUS

## 2023-01-02 MED ORDER — INSULIN ASPART 100 UNIT/ML IJ SOLN
6.0000 [IU] | Freq: Once | INTRAMUSCULAR | Status: AC
  Administered 2023-01-02: 6 [IU] via SUBCUTANEOUS

## 2023-01-02 MED ORDER — LEVOTHYROXINE SODIUM 88 MCG PO TABS
88.0000 ug | ORAL_TABLET | Freq: Every day | ORAL | Status: DC
  Administered 2023-01-03 – 2023-01-07 (×5): 88 ug via ORAL
  Filled 2023-01-02 (×6): qty 1

## 2023-01-02 MED ORDER — FENTANYL CITRATE (PF) 250 MCG/5ML IJ SOLN
INTRAMUSCULAR | Status: DC | PRN
  Administered 2023-01-02: 100 ug via INTRAVENOUS

## 2023-01-02 MED ORDER — POVIDONE-IODINE 10 % EX SWAB
2.0000 | Freq: Once | CUTANEOUS | Status: AC
  Administered 2023-01-02: 2 via TOPICAL

## 2023-01-02 MED ORDER — 0.9 % SODIUM CHLORIDE (POUR BTL) OPTIME
TOPICAL | Status: DC | PRN
  Administered 2023-01-02: 1000 mL

## 2023-01-02 MED ORDER — COLCHICINE 0.6 MG PO TABS
0.6000 mg | ORAL_TABLET | Freq: Every day | ORAL | Status: DC
  Administered 2023-01-02 – 2023-01-03 (×2): 0.6 mg via ORAL
  Filled 2023-01-02 (×2): qty 1

## 2023-01-02 MED ORDER — SODIUM CHLORIDE 0.9 % IR SOLN
Status: DC | PRN
  Administered 2023-01-02 (×2): 3000 mL

## 2023-01-02 MED ORDER — VANCOMYCIN HCL 1000 MG IV SOLR
INTRAVENOUS | Status: DC | PRN
  Administered 2023-01-02: 1000 mg

## 2023-01-02 MED ORDER — SODIUM CHLORIDE 0.9 % IV SOLN
INTRAVENOUS | Status: DC
Start: 2023-01-02 — End: 2023-01-03

## 2023-01-02 MED ORDER — DULOXETINE HCL 60 MG PO CPEP
60.0000 mg | ORAL_CAPSULE | Freq: Every day | ORAL | Status: DC
  Administered 2023-01-02 – 2023-01-07 (×6): 60 mg via ORAL
  Filled 2023-01-02 (×6): qty 1

## 2023-01-02 MED ORDER — CEFAZOLIN IN SODIUM CHLORIDE 3-0.9 GM/100ML-% IV SOLN
3.0000 g | INTRAVENOUS | Status: AC
  Administered 2023-01-02: 3 g via INTRAVENOUS
  Filled 2023-01-02: qty 100

## 2023-01-02 MED ORDER — CEFAZOLIN SODIUM-DEXTROSE 2-4 GM/100ML-% IV SOLN: 2.0000 g | Freq: Two times a day (BID) | INTRAVENOUS | Status: DC

## 2023-01-02 MED ORDER — ROCURONIUM BROMIDE 10 MG/ML (PF) SYRINGE
PREFILLED_SYRINGE | INTRAVENOUS | Status: AC
  Filled 2023-01-02: qty 20

## 2023-01-02 MED ORDER — CHLORHEXIDINE GLUCONATE 0.12 % MT SOLN: 15.0000 mL | Freq: Once | OROMUCOSAL | Status: DC

## 2023-01-02 MED ORDER — FENTANYL CITRATE (PF) 100 MCG/2ML IJ SOLN: 25.0000 ug | INTRAMUSCULAR | Status: DC | PRN

## 2023-01-02 MED ORDER — FENTANYL CITRATE PF 50 MCG/ML IJ SOSY
25.0000 ug | PREFILLED_SYRINGE | INTRAMUSCULAR | Status: DC | PRN
  Administered 2023-01-03 – 2023-01-05 (×2): 25 ug via INTRAVENOUS
  Filled 2023-01-02 (×2): qty 1

## 2023-01-02 MED ORDER — PHENYLEPHRINE 80 MCG/ML (10ML) SYRINGE FOR IV PUSH (FOR BLOOD PRESSURE SUPPORT)
PREFILLED_SYRINGE | INTRAVENOUS | Status: DC | PRN
  Administered 2023-01-02 (×2): 200 ug via INTRAVENOUS
  Administered 2023-01-02: 80 ug via INTRAVENOUS
  Administered 2023-01-02: 160 ug via INTRAVENOUS
  Administered 2023-01-02: 80 ug via INTRAVENOUS
  Administered 2023-01-02: 160 ug via INTRAVENOUS
  Administered 2023-01-02: 240 ug via INTRAVENOUS
  Administered 2023-01-02 (×2): 160 ug via INTRAVENOUS

## 2023-01-02 MED ORDER — INSULIN GLARGINE-YFGN 100 UNIT/ML ~~LOC~~ SOLN
50.0000 [IU] | Freq: Every day | SUBCUTANEOUS | Status: DC
Start: 2023-01-02 — End: 2023-01-07
  Administered 2023-01-03 – 2023-01-07 (×5): 50 [IU] via SUBCUTANEOUS
  Filled 2023-01-02 (×6): qty 0.5

## 2023-01-02 SURGICAL SUPPLY — 101 items
BAG COUNTER SPONGE SURGICOUNT (BAG) ×2 IMPLANT
BANDAGE ESMARK 6X9 LF (GAUZE/BANDAGES/DRESSINGS) ×2 IMPLANT
BIT DRILL 2.4 AO COUPLING CANN (BIT) IMPLANT
BIT DRILL CANN 3.5MM (DRILL) IMPLANT
BIT DRILL SHORT 2.0 ZI (BIT) IMPLANT
BIT DRILL SHORT 2.5 (BIT) IMPLANT
BIT DRL SHORT 2.5 (BIT) ×2
BNDG COHESIVE 4X5 TAN STRL (GAUZE/BANDAGES/DRESSINGS) ×2 IMPLANT
BNDG COHESIVE 6X5 TAN ST LF (GAUZE/BANDAGES/DRESSINGS) IMPLANT
BNDG ELASTIC 4INX 5YD STR LF (GAUZE/BANDAGES/DRESSINGS) IMPLANT
BNDG ELASTIC 4X5.8 VLCR STR LF (GAUZE/BANDAGES/DRESSINGS) ×2 IMPLANT
BNDG ELASTIC 6INX 5YD STR LF (GAUZE/BANDAGES/DRESSINGS) IMPLANT
BNDG ELASTIC 6X5.8 VLCR STR LF (GAUZE/BANDAGES/DRESSINGS) ×2 IMPLANT
BNDG ESMARK 6X9 LF (GAUZE/BANDAGES/DRESSINGS) ×2
BNDG GAUZE DERMACEA FLUFF 4 (GAUZE/BANDAGES/DRESSINGS) ×4 IMPLANT
BRUSH SCRUB EZ PLAIN DRY (MISCELLANEOUS) ×4 IMPLANT
COVER MAYO STAND STRL (DRAPES) ×2 IMPLANT
COVER SURGICAL LIGHT HANDLE (MISCELLANEOUS) ×4 IMPLANT
CUFF TOURN SGL QUICK 18X4 (TOURNIQUET CUFF) IMPLANT
CUFF TOURN SGL QUICK 34 (TOURNIQUET CUFF) ×2
CUFF TRNQT CYL 34X4.125X (TOURNIQUET CUFF) ×2 IMPLANT
DRAPE C-ARM 42X72 X-RAY (DRAPES) ×2 IMPLANT
DRAPE C-ARMOR (DRAPES) ×2 IMPLANT
DRAPE HALF SHEET 40X57 (DRAPES) ×2 IMPLANT
DRAPE U-SHAPE 47X51 STRL (DRAPES) ×2 IMPLANT
DRILL CANN 3.5MM (DRILL) ×2
DRIVER RETENTION T15 LONG (ORTHOPEDIC DISPOSABLE SUPPLIES) IMPLANT
DRSG ADAPTIC 3X8 NADH LF (GAUZE/BANDAGES/DRESSINGS) ×2 IMPLANT
DRSG EMULSION OIL 3X3 NADH (GAUZE/BANDAGES/DRESSINGS) IMPLANT
DRSG MEPITEL 4X7.2 (GAUZE/BANDAGES/DRESSINGS) IMPLANT
ELECT REM PT RETURN 9FT ADLT (ELECTROSURGICAL) ×2
ELECTRODE REM PT RTRN 9FT ADLT (ELECTROSURGICAL) ×2 IMPLANT
EVACUATOR 1/8 PVC DRAIN (DRAIN) IMPLANT
FELT TEFLON 6X6 (MISCELLANEOUS) IMPLANT
GAUZE SPONGE 4X4 12PLY STRL (GAUZE/BANDAGES/DRESSINGS) ×4 IMPLANT
GLOVE BIO SURGEON STRL SZ7.5 (GLOVE) ×2 IMPLANT
GLOVE BIO SURGEON STRL SZ8 (GLOVE) ×2 IMPLANT
GLOVE BIOGEL PI IND STRL 7.5 (GLOVE) ×2 IMPLANT
GLOVE BIOGEL PI IND STRL 8 (GLOVE) ×2 IMPLANT
GLOVE BIOGEL PI IND STRL 9 (GLOVE) ×2 IMPLANT
GLOVE SURG ORTHO LTX SZ7.5 (GLOVE) ×4 IMPLANT
GOWN STRL REUS W/ TWL LRG LVL3 (GOWN DISPOSABLE) ×4 IMPLANT
GOWN STRL REUS W/ TWL XL LVL3 (GOWN DISPOSABLE) ×2 IMPLANT
GOWN STRL REUS W/TWL LRG LVL3 (GOWN DISPOSABLE) ×4
GOWN STRL REUS W/TWL XL LVL3 (GOWN DISPOSABLE) ×2
HANDPIECE INTERPULSE COAX TIP (DISPOSABLE)
K-WIRE 1.8 (WIRE) ×2
K-WIRE FX200X1.8XTROC TIP (WIRE) ×2
K-WIRE TROC 1.25X150 (WIRE) ×4
KIT BASIN OR (CUSTOM PROCEDURE TRAY) ×2 IMPLANT
KIT TURNOVER KIT B (KITS) ×2 IMPLANT
KWIRE FX200X1.8XTROC TIP (WIRE) IMPLANT
KWIRE TROC 1.25X150 (WIRE) IMPLANT
MANIFOLD NEPTUNE II (INSTRUMENTS) ×2 IMPLANT
NDL 22X1.5 STRL (OR ONLY) (MISCELLANEOUS) IMPLANT
NDL HYPO 21X1.5 SAFETY (NEEDLE) IMPLANT
NEEDLE 22X1.5 STRL (OR ONLY) (MISCELLANEOUS) IMPLANT
NEEDLE HYPO 21X1.5 SAFETY (NEEDLE) IMPLANT
NS IRRIG 1000ML POUR BTL (IV SOLUTION) ×2 IMPLANT
PACK GENERAL/GYN (CUSTOM PROCEDURE TRAY) ×2 IMPLANT
PACK ORTHO EXTREMITY (CUSTOM PROCEDURE TRAY) ×2 IMPLANT
PAD ABD 8X10 STRL (GAUZE/BANDAGES/DRESSINGS) IMPLANT
PAD ARMBOARD 7.5X6 YLW CONV (MISCELLANEOUS) ×4 IMPLANT
PAD CAST 4YDX4 CTTN HI CHSV (CAST SUPPLIES) IMPLANT
PADDING CAST COTTON 4X4 STRL (CAST SUPPLIES) ×2
PADDING CAST COTTON 6X4 STRL (CAST SUPPLIES) ×4 IMPLANT
PLATE TUBULAR 1/3 10H (Plate) IMPLANT
SCREW CANN 4.0X60MM PART THRD (Screw) IMPLANT
SCREW CANN 5.0X55 (Screw) IMPLANT
SCREW CANN FT 4X55 (Screw) IMPLANT
SCREW CANNULATED PT 4.0X55MM (Screw) IMPLANT
SCREW LOCK MDS 3.5X12 (Screw) IMPLANT
SCREW NLOCK 3.5X55 (Screw) IMPLANT
SCREW NLOCK 3.5X60 (Screw) IMPLANT
SCREW NON-LOCK 3.5X12 (Screw) IMPLANT
SET HNDPC FAN SPRY TIP SCT (DISPOSABLE) IMPLANT
SOL PREP POV-IOD 4OZ 10% (MISCELLANEOUS) ×2 IMPLANT
SOL SCRUB PVP POV-IOD 4OZ 7.5% (MISCELLANEOUS) ×2
SOLUTION SCRB POV-IOD 4OZ 7.5% (MISCELLANEOUS) ×2 IMPLANT
SPONGE T-LAP 18X18 ~~LOC~~+RFID (SPONGE) ×2 IMPLANT
STAPLER VISISTAT 35W (STAPLE) IMPLANT
STOCKINETTE IMPERVIOUS 9X36 MD (GAUZE/BANDAGES/DRESSINGS) IMPLANT
STOCKINETTE IMPERVIOUS LG (DRAPES) IMPLANT
STRIP CLOSURE SKIN 1/2X4 (GAUZE/BANDAGES/DRESSINGS) IMPLANT
SUCTION TUBE FRAZIER 10FR DISP (SUCTIONS) ×2 IMPLANT
SUT ETHILON 2 0 FS 18 (SUTURE) ×4 IMPLANT
SUT ETHILON 2 0 PSLX (SUTURE) IMPLANT
SUT ETHILON O TP 1 (SUTURE) IMPLANT
SUT PDS AB 2-0 CT1 27 (SUTURE) IMPLANT
SUT PROLENE 0 CT 2 (SUTURE) IMPLANT
SUT VIC AB 1 CTX 36 (SUTURE) ×2
SUT VIC AB 1 CTX36XBRD ANBCTR (SUTURE) IMPLANT
SUT VIC AB 2-0 CT1 27 (SUTURE) ×4
SUT VIC AB 2-0 CT1 36 (SUTURE) IMPLANT
SUT VIC AB 2-0 CT1 TAPERPNT 27 (SUTURE) ×4 IMPLANT
TOWEL GREEN STERILE (TOWEL DISPOSABLE) ×4 IMPLANT
TOWEL GREEN STERILE FF (TOWEL DISPOSABLE) ×4 IMPLANT
TUBE CONNECTING 12X1/4 (SUCTIONS) ×2 IMPLANT
UNDERPAD 30X36 HEAVY ABSORB (UNDERPADS AND DIAPERS) ×2 IMPLANT
WATER STERILE IRR 1000ML POUR (IV SOLUTION) ×2 IMPLANT
YANKAUER SUCT BULB TIP NO VENT (SUCTIONS) ×2 IMPLANT

## 2023-01-02 NOTE — Progress Notes (Signed)
S/p fall, R ankle fx   01/02/23 1623  TOC Brief Assessment  Insurance and Status Reviewed  Patient has primary care physician Yes  Home environment has been reviewed From home with partner.  Prior level of function: Prior to injury independent with ADL's.  Prior/Current Home Services No current home services  Social Determinants of Health Reivew SDOH reviewed no interventions necessary  Readmission risk has been reviewed No  Transition of care needs transition of care needs identified, TOC will continue to follow (Open right ankle fx -- Plan I&D, ORIF vs ex fix today)   PT evaluation pending. TOC team following and will assist with needs.

## 2023-01-02 NOTE — Consult Note (Signed)
Reason for Consult:Open right ankle fx Referring Physician: Madelyn Flavors Time called: 0730 Time at bedside: 0829   Harold [name] is an 67 y.o. male.  HPI: Harold [name] was sitting on the edge of his bed last night. He got dizzy and fell. He had immediate pain and deformity to his right ankle but thought maybe it was just sprained so went to bed. He got up this morning, stepped out of bed, and fell again. This time the ankle started bleeding and he was brought to the ED. X-rays showed an open ankle fx and orthopedic surgery was consulted. The ankle was reduced and splinted. He lives at home with a partner and does not work. He uses a cane when he leaves the house.  Past Medical History:  Diagnosis Date   Anginal pain [facility]) March 2015   Cardiac cath showed patent stents with distal LAD, circumflex-OM and RCA disease in small vessels.   Anxiety    CAD S/P percutaneous coronary angioplasty 2003, 04/2012   status post PCI to LAD, circumflex-OM 2, RCA   Carotid artery occlusion    Chronic renal insufficiency, stage II (mild)    Chronic venous insufficiency    varicosities, no reflux; dopplers 04/14/12- valvular insufficiency in the R and L GSV   Complication of anesthesia    COPD, mild (HCC)    Depression    situaltional    Diabetes mellitus    Diabetic neuropathy (HCC) 08/24/2019   Dyslipidemia associated with type 2 diabetes mellitus (HCC)    GERD (gastroesophageal reflux disease)    HTN (hypertension)    Hypothyroidism    Neuromuscular disorder (HCC)    neuropathy in feet   Neuropathy    notably improved following PCI with improved cardiac function   Obesity    Obesity, Class II, BMI 35.0-39.9, with comorbidity (see actual BMI)    BMI 39; wgt loss efforts in place; seeing Dietician   PONV (postoperative nausea and vomiting)    "[employer] Works"   Pulmonary hypertension (HCC) 04/2012   PA pressure   Sleep apnea    uses nightly    Past Surgical History:  Procedure Laterality  Date   ABDOMINAL AORTAGRAM N/A 11/15/2011   Procedure: ABDOMINAL Ronny Flurry;  Surgeon: Sherren Kerns, MD;  Location: [facility] CATH LAB;  Service: Cardiovascular;  [name];   ABDOMINAL AORTOGRAM W/LOWER EXTREMITY N/A 10/13/2019   Procedure: ABDOMINAL AORTOGRAM W/LOWER EXTREMITY;  Surgeon: Iran Ouch, MD;  Location: [facility] INVASIVE CV LAB;  Service: CV: Non-obst Ao-Iliac. R SFA-PopA patent with significant calcific stenosis of TP trunk-prox PTA (dominant) & CTO ATA  -> R PTA-TP PTCA   ABDOMINAL AORTOGRAM W/LOWER EXTREMITY N/A 11/06/2022   Procedure: ABDOMINAL AORTOGRAM W/LOWER EXTREMITY;  Surgeon: Iran Ouch, MD;  Location: [facility] INVASIVE CV LAB;  Service: Cardiovascular;  [name];   ABIs  04/27/2012   mild bilateral arterial insufficiency   BACK SURGERY  2005 x1   X2-2010   BUNIONECTOMY Right 11/11/2013   Procedure: RIGHT FOOT SILVER BUNIONECTOMY;  Surgeon: Toni Arthurs, MD;  Location[facility];  Service: Orthopedics;  Laterality: Right;   CARDIAC CATHETERIZATION  12/11/2001   significant 3V CAD, normal LV function   CARDIOVERSION N/A 03/13/2022   Procedure: CARDIOVERSION;  Surgeon: Little Ishikawa, MD;  Location: [facility] ENDOSCOPY;  Service: Cardiovascular;  [name];   CARDIOVERSION N/A 07/25/2022   Procedure: CARDIOVERSION;  Surgeon: Jake Bathe, MD;  Location: [facility] INVASIVE CV LAB;  Service: Cardiovascular;  Laterality:  N/A;   Carotid Doppler  04/27/2012   right internal carotid: Elevated velocities but no evidence of plaque. Left internal carotid 40-59%   CAROTID ENDARTERECTOMY  2005   Right; recent carotid Dopplers notes elevated velocities.    colonscopy     CORONARY ANGIOPLASTY  12/21/2001   PTCA of the distal and mid AV groove circ, unsuccessful PTCA of second OM total occlusion, unsuccessful PTCA of the apical LAD total occlusion   CORONARY ANGIOPLASTY WITH STENT PLACEMENT  04/29/2012   PCI to 3 RCA lesions, Promus Premiere 2.70mmx8mm  distally, mid was 2.11mx28mm and proximally 2.75x64mm, EF 55-60%   CORONARY STENT PLACEMENT  04/28/2012   PCI to LAD (3x74mm Xience DES postdilated to 3.25) and circ prox and mid (2 overlappinmg 2.24mmx12mmXience DES postdilated to 2.27mm)   DOPPLER ECHOCARDIOGRAPHY  04/28/2012   poor quality study: EF estimated 60-65%; unable to assess diastolic function (previously noted to have diastolic dysfunction); severely dilated left atrium and mild right atrium; dilated IVC consistent with increased central venous pressure.Marland Kitchen   HERNIA REPAIR     LEFT AND RIGHT HEART CATHETERIZATION WITH CORONARY ANGIOGRAM N/A 04/27/2012   Procedure: LEFT AND RIGHT HEART CATHETERIZATION WITH CORONARY ANGIOGRAM;  Surgeon: Marykay Lex, MD;  Location: [facility] CATH LAB;  Service: Cardiovascular;  [name];   LEFT AND RIGHT HEART CATHETERIZATION WITH CORONARY ANGIOGRAM N/A 05/14/2013   Procedure: LEFT AND RIGHT HEART CATHETERIZATION WITH CORONARY ANGIOGRAM;  Surgeon: Lennette Bihari, MD;  [name]; PCWP ;; [name], [name], Patent RCA ostial mid and distal stents - 70% distal runoff Dz   LUMBAR LAMINECTOMY/DECOMPRESSION MICRODISCECTOMY  01/07/2012   Procedure: LUMBAR LAMINECTOMY/DECOMPRESSION MICRODISCECTOMY 1 LEVEL;  Surgeon: Carmela Hurt, MD;  [name];  Service: Neurosurgery;  Laterality: Bilateral;   Lumbar Three-Four Decompression   LUMBAR LAMINECTOMY/DECOMPRESSION MICRODISCECTOMY N/A 07/26/2020   Procedure: Lumbar two-three Laminectomy/Foraminotomy;  Surgeon: Coletta Memos, MD;  [name];  Service: Neurosurgery;  [name];   PERCUTANEOUS CORONARY STENT INTERVENTION (PCI-S) N/A 04/28/2012   Procedure: PERCUTANEOUS CORONARY STENT INTERVENTION (PCI-S);  Surgeon: Lennette Bihari, MD;  Location: [facility] CATH LAB;  Service: Cardiovascular;  Laterality:  N/A;   PERCUTANEOUS CORONARY STENT INTERVENTION (PCI-S) N/A 04/29/2012   Procedure: PERCUTANEOUS CORONARY STENT INTERVENTION (PCI-S);  Surgeon: Marykay Lex, MD;  Location: [facility] CATH LAB;  Service: Cardiovascular;  [name];   PERIPHERAL VASCULAR ATHERECTOMY  11/06/2022   Procedure: PERIPHERAL VASCULAR ATHERECTOMY;  Surgeon: Iran Ouch, MD;  [facility]AS[facility] CV LAB;  Service: Cardiovascular;;   PERIPHERAL VASCULAR BALLOON ANGIOPLASTY Right 10/13/2019   Procedure: PERIPHERAL VASCULAR BALLOON ANGIOPLASTY;  Surgeon: Iran Ouch, MD;  Location: [facility] INVAS[facility] CV LAB;  Service: Cardiovascular;  Laterality: Right;  PTCA of R TP Trunk into PTA (Drug-coated) => Follow-up ABIs 0.9 on the right and 0.99 on the left.   SPINE SURGERY     UMBILICAL HERNIA REPAIR  2009   steel mesh insert    Family History  Adopted: Yes  Family history unknown: Yes    Social History:  reports that he quit smoking about 19 years ago. His smoking use included cigarettes. He started smoking about 61 years ago. He has a 42 pack-year smoking history. He has never used smokeless tobacco. He reports that he does not drink alcohol and does not use drugs.  Allergies:  Allergies  Allergen Reactions   Septra [Bactrim] Itching   Penicillins Rash    Allergy to All cillin drugs  Ancef given 9/24 with no obvious reaction   Metformin And Related Diarrhea and Nausea Only   Other Nausea And Vomiting    General Anesthesia - sometimes causes nausea and vomiting * OK to use scopolamine patch     Sulfamethoxazole-Trimethoprim Other (See Comments)   Wellbutrin [Bupropion]     Mood Changes   Doxycycline Hives and Rash    Medications: I have reviewed the patient's current medications.  Results for orders placed or performed during the hospital encounter of 01/02/23 (from the past 48 hour(s))  Sample to Blood Bank     Status: None   Collection Time: 01/02/23  6:22 AM  Result Value Ref Range   Blood Bank  Specimen SAMPLE AVAILABLE FOR TESTING    Sample Expiration      01/05/2023,2359 Performed at [facility] Lab, [address], [place], [place] [address]   Type and screen [facility]     Status: None (Preliminary result)   Collection Time: 01/02/23  6:22 AM  Result Value Ref Range   ABO/RH(D) PENDING    Antibody Screen PENDING    Sample Expiration      01/05/2023,2359 Performed at [facility] Lab, [address] [place], [place] [address]   Protime-INR     Status: Abnormal   Collection Time: 01/02/23  6:24 AM  Result Value Ref Range   Prothrombin Time 16.1 (H) 11.4 - 15.2 seconds   INR 1.3 (H) 0.8 - 1.2    Comment: (NOTE) INR goal varies based on device and disease states. Performed at [facility], [address], [place], [place] [address]   CBC     Status: Abnormal   Collection Time: 01/02/23  6:24 AM  Result Value Ref Range   WBC 15.8 (H) 4.0 - 10.5 K/uL   RBC 3.89 (L) 4.22 - 5.81 MIL/uL   Hemoglobin 12.2 (L) 13.0 - 17.0 g/dL   HCT 56.2 (L) 13.0 - 86.5 %   MCV 89.5 80.0 - 100.0 fL   MCH 31.4 26.0 - 34.0 pg   MCHC 35.1 30.0 - 36.0 g/dL   RDW 78.4 69.6 - 29.5 %   Platelets 202 150 - 400 K/uL   nRBC 0.0 0.0 - 0.2 %    Comment: Performed at [facility] Lab, [address], [place], [place] [address]  Comprehensive metabolic panel     Status: Abnormal   Collection Time: 01/02/23  6:24 AM  Result Value Ref Range   Sodium 133 (L) 135 - 145 mmol/L   Potassium 3.7 3.5 - 5.1 mmol/L   Chloride 100 98 - 111 mmol/L   CO2 19 (L) 22 - 32 mmol/L   Glucose, Bld 300 (H) 70 - 99 mg/dL    Comment: Glucose reference range applies only to samples taken after fasting for at least 8 hours.   BUN 40 (H) 8 - 23 mg/dL   Creatinine, Ser 2.44 (H) 0.61 - 1.24 mg/dL   Calcium 7.9 (L) 8.9 - 10.3 mg/dL   Total Protein 4.9 (L) 6.5 - 8.1 g/dL   Albumin 2.6 (L) 3.5 - 5.0 g/dL   AST 20 15 - 41 U/L   ALT 14 0 - 44 U/L   Alkaline Phosphatase 71 38 - 126 U/L    Total Bilirubin 0.7 <1.2 mg/dL   GFR, Estimated 17 (L) >60 mL/min  Comment: (NOTE) Calculated using the CKD-EPI Creatinine Equation (2021)    Anion gap 14 5 - 15    Comment: Performed at [facility] Lab, [address] [place], [place] [address]  CBG monitoring, ED     Status: Abnormal   Collection Time: 01/02/23  8:27 AM  Result Value Ref Range   Glucose-Capillary 271 (H) 70 - 99 mg/dL    Comment: Glucose reference range applies only to samples taken after fasting for at least 8 hours.   *Note: Due to a large number of results and/or encounters for the requested time period, some results have not been displayed. A complete set of results can be found in Results Review.    DG Chest Portable 1 View  Result Date: 01/02/2023 CLINICAL DATA:  Preop planning. EXAM: PORTABLE CHEST 1 VIEW COMPARISON:  AP Lat chest 07/10/2022 FINDINGS: The heart is slightly enlarged. No vascular congestion is seen. The mediastinal configuration is normal. There is calcification of the transverse aorta. The lungs are clear. There is no substantial pleural effusion. Thoracic spondylosis with no new osseous findings. Compare: Stable chest. IMPRESSION: No evidence of acute chest disease. Slight cardiomegaly. Aortic atherosclerosis. Stable chest. Electronically Signed   By: Almira Bar M.D.   On: 01/02/2023 07:29   DG Ankle 2 Views Right  Result Date: 01/02/2023 CLINICAL DATA:  67 year old male history of open fracture of the right ankle status post reduction. EXAM: RIGHT ANKLE - 2 VIEW COMPARISON:  01/02/2023. FINDINGS: Compared to the prior examination there has been substantial improvement in alignment of the previously described fracture/dislocation of the right ankle. Comminuted displaced fracture of the distal third of the fibular diaphysis is again noted, now with predominantly 7 mm of posterior displacement of the distal fracture fragments and minimal residual angulation. The distal tibia is now associated  with the talus, with some mild widening of the medial aspect of the tibiotalar joint. Osseous fragment adjacent to the medial malleolus remains displaced with some varus rotation. There also appears to be an osseous defect along the anterior aspect of the distal tibia, suggesting additional fracture. Interval placement of overlying splint material obscures finer bony detail. IMPRESSION: 1. Status post closed reduction splint fixation of previously noted fracture dislocation of the right ankle with improved alignment, as above. Electronically Signed   By: Trudie Reed M.D.   On: 01/02/2023 06:36   DG Ankle 2 Views Right  Result Date: 01/02/2023 CLINICAL DATA:  67 year old male with history of open fracture of the right ankle. EXAM: RIGHT ANKLE - 2 VIEW COMPARISON:  Right ankle radiograph 09/09/2019. FINDINGS: Two views of the right ankle demonstrates severe fracture/dislocation of the right ankle. Specifically, there is a displaced, comminuted and angulated fracture of the distal third of the fibular diaphysis, with proximally 30 degrees of lateral angulation. Distal fibular fracture remains associated with the talus. Complete dislocation of the tibia relative to the talus, with the distal tibia lying completely medial to the talus and slightly posterior, likely impacted upon the bone. There is an acute displaced fracture of the medial malleolus with proximally 1.2 cm of distal migration of the distal fracture fragment. Overlying soft tissues are swollen and grossly distorted. Finer bony detail is obscured by overlying drape materials. IMPRESSION: 1. Severe fracture dislocation of the right ankle joint, as detailed above. Electronically Signed   By: Trudie Reed M.D.   On: 01/02/2023 06:22    Review of Systems  HENT:  Negative for ear discharge, ear pain, hearing loss  and tinnitus.   Eyes:  Negative for photophobia and pain.  Respiratory:  Negative for cough and shortness of breath.    Cardiovascular:  Negative for chest pain.  Gastrointestinal:  Negative for abdominal pain, nausea and vomiting.  Genitourinary:  Negative for dysuria, flank pain, frequency and urgency.  Musculoskeletal:  Positive for arthralgias (Right ankle). Negative for back pain, myalgias and neck pain.  Neurological:  Negative for dizziness and headaches.  Hematological:  Does not bruise/bleed easily.  Psychiatric/Behavioral:  The patient is not nervous/anxious.    Blood pressure (!) 168/80, pulse 77, temperature (!) 97.5 F (36.4 C), resp. rate 17, height 6\' 1"  (1.854 m), weight (!) 142.9 kg, SpO2 100%. Physical Exam Constitutional:      General: He is not in acute distress.    Appearance: He is well-developed. He is not diaphoretic.  HENT:     Head: Normocephalic and atraumatic.  Eyes:     General: No scleral icterus.       Right eye: No discharge.        Left eye: No discharge.     Conjunctiva/sclera: Conjunctivae normal.  Cardiovascular:     Rate and Rhythm: Normal rate and regular rhythm.  Pulmonary:     Effort: Pulmonary effort is normal. No respiratory distress.  Musculoskeletal:     Cervical back: Normal range of motion.     Comments: RLE No visible traumatic wounds, ecchymosis, or rash  Short leg splint in place  No knee effusion  Knee stable to varus/ valgus and anterior/posterior stress  Sens DPN, SPN, TN paresthetic (chronic)  Motor EHL 5/5  Toes perfused, No significant edema  Skin:    General: Skin is warm and dry.  Neurological:     Mental Status: He is alert.  Psychiatric:        Mood and Affect: Mood normal.        Behavior: Behavior normal.     Assessment/Plan: Open right ankle fx -- Plan I&D, ORIF vs ex fix today with Dr. Carola Frost. Please keep NPO. Multiple medical problems including hypertension, hyperlipidemia, CAD s/p PCI, COPD, diabetes mellitus type 2, CKD stage IIIb, peripheral vascular disease, anxiety, depression, obesity, and OSA on CPAP -- per primary  service    Freeman Caldron, PA-C Orthopedic Surgery [phone] 01/02/2023, 8:35 AM

## 2023-01-02 NOTE — ED Notes (Signed)
ED TO INPATIENT HANDOFF REPORT  ED Nurse Name and Phone #: Marchelle Folks RN [phone]  S Name/Age/Gender Harold [name] 67 y.o. male Room/Bed: TRAAC/TRAAC  Code Status   Code Status: Prior  Home/SNF/Other Home Patient oriented to: self, place, time, and situation Is this baseline? Yes   Triage Complete: Triage complete  Chief Complaint Open ankle fracture [S82.899B]  Triage Note Patient GB comes from home 11/13 at 2000 he fell because he got dizzy. He thought it  was bad sprain. He states he then went to bed. Tonight when the he got up to go to the bathroom his ankle went out from under him and he started bleeding. The bone is sticking through the medial part of the ankle. Patient has neuropathy at baseline. He also is on eliquis. Tetanus shot unknown.    Allergies Allergies  Allergen Reactions   Septra [Bactrim] Itching   Penicillins Hives    Allergy to All cillin drugs  Ancef given 9/24 with no obvious reaction   Metformin And Related Diarrhea and Nausea Only   Other Nausea And Vomiting    General Anesthesia - sometimes causes nausea and vomiting * OK to use scopolamine patch     Sulfamethoxazole-Trimethoprim Other (See Comments)   Wellbutrin [Bupropion]     Mood Changes   Doxycycline Hives and Rash    Level of Care/Admitting Diagnosis ED Disposition     ED Disposition  Admit   Condition  --   Comment  Hospital Area: [facility] [[id number]]  Level of Care: Telemetry Surgical [105]  May admit patient to [facility] or [facility] if equivalent level of care is available:: No  Covid Evaluation: Asymptomatic - no recent exposure (last 10 days) testing not required  Diagnosis: Open ankle fracture [644034]  Admitting Physician: Clydie Braun [[id number]]  Attending Physician: Clydie Braun [[id number]]  Certification:: I certify this patient is being admitted for an inpatient-only procedure  Expected Medical Readiness: 01/05/2023           B Medical/Surgery History Past Medical History:  Diagnosis Date   Anginal pain [facility]) March 2015   Cardiac cath showed patent stents with distal LAD, circumflex-OM and RCA disease in small vessels.   Anxiety    CAD S/P percutaneous coronary angioplasty 2003, 04/2012   status post PCI to LAD, circumflex-OM 2, RCA   Carotid artery occlusion    Chronic renal insufficiency, stage II (mild)    Chronic venous insufficiency    varicosities, no reflux; dopplers 04/14/12- valvular insufficiency in the R and L GSV   Complication of anesthesia    COPD, mild (HCC)    Depression    situaltional    Diabetes mellitus    Diabetic neuropathy (HCC) 08/24/2019   Dyslipidemia associated with type 2 diabetes mellitus (HCC)    GERD (gastroesophageal reflux disease)    HTN (hypertension)    Hypothyroidism    Neuromuscular disorder (HCC)    neuropathy in feet   Neuropathy    notably improved following PCI with improved cardiac function   Obesity    Obesity, Class II, 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-39.9, with comorbidity (see actual BMI)    BMI 39; wgt loss efforts in place; seeing Dietician   PONV (postoperative nausea and vomiting)    "[employer]"   Pulmonary hypertension (HCC) 04/2012   PA pressure   Sleep apnea    uses nightly   Past Surgical History:  Procedure Laterality Date   ABDOMINAL AORTAGRAM N/A 11/15/2011  Procedure: ABDOMINAL AORTAGRAM;  Surgeon: Sherren Kerns, MD;  Location: [facility] CATH LAB;  Service: Cardiovascular;  [name];   ABDOMINAL AORTOGRAM W/LOWER EXTREMITY N/A 10/13/2019   Procedure: ABDOMINAL AORTOGRAM W/LOWER EXTREMITY;  Surgeon: Iran Ouch, MD;  Location: [facility] INVASIVE CV LAB;  Service: CV: Non-obst Ao-Iliac. R SFA-PopA patent with significant calcific stenosis of TP trunk-prox PTA (dominant) & CTO ATA  -> R PTA-TP PTCA   ABDOMINAL AORTOGRAM W/LOWER EXTREMITY N/A 11/06/2022   Procedure: ABDOMINAL AORTOGRAM W/LOWER EXTREMITY;  Surgeon: Iran Ouch, MD;  Location: [facility]  INVASIVE CV LAB;  Service: Cardiovascular;  [name];   ABIs  04/27/2012   mild bilateral arterial insufficiency   BACK SURGERY  2005 x1   X2-2010   BUNIONECTOMY Right 11/11/2013   Procedure: RIGHT FOOT SILVER BUNIONECTOMY;  Surgeon: Toni Arthurs, MD;  Location: [facility];  Service: Orthopedics;  Laterality: Right;   CARDIAC CATHETERIZATION  12/11/2001   significant 3V CAD, normal LV function   CARDIOVERSION N/A 03/13/2022   Procedure: CARDIOVERSION;  Surgeon: Little Ishikawa, MD;  Location: [facility] ENDOSCOPY;  Service: Cardiovascular;  [name];   CARDIOVERSION N/A 07/25/2022   Procedure: CARDIOVERSION;  Surgeon: Jake Bathe, MD;  Location: [facility] INVASIVE CV LAB;  Service: Cardiovascular;  [name];   Carotid Doppler  04/27/2012   right internal carotid: Elevated velocities but no evidence of plaque. Left internal carotid 40-59%   CAROTID ENDARTERECTOMY  2005   Right; recent carotid Dopplers notes elevated velocities.    colonscopy     CORONARY ANGIOPLASTY  12/21/2001   PTCA of the distal and mid AV groove circ, unsuccessful PTCA of second OM total occlusion, unsuccessful PTCA of the apical LAD total occlusion   CORONARY ANGIOPLASTY WITH STENT PLACEMENT  04/29/2012   PCI to 3 RCA lesions, Promus Premiere 2.1mmx8mm distally, mid was 2.23mx28mm and proximally 2.75x36mm, EF 55-60%   CORONARY STENT PLACEMENT  04/28/2012   PCI to LAD (3x23mm Xience DES postdilated to 3.25) and circ prox and mid (2 overlappinmg 2.34mmx12mmXience DES postdilated to 2.9mm)   DOPPLER ECHOCARDIOGRAPHY  04/28/2012   poor quality study: EF estimated 60-65%; unable to assess diastolic function (previously noted to have diastolic dysfunction); severely dilated left atrium and mild right atrium; dilated IVC consistent with increased central venous pressure.Marland Kitchen   HERNIA REPAIR     LEFT AND RIGHT HEART CATHETERIZATION WITH CORONARY ANGIOGRAM N/A 04/27/2012   Procedure: LEFT AND RIGHT  HEART CATHETERIZATION WITH CORONARY ANGIOGRAM;  Surgeon: Marykay Lex, MD;  Location: [facility] CATH LAB;  Service: Cardiovascular;  [name];   LEFT AND RIGHT HEART CATHETERIZATION WITH CORONARY ANGIOGRAM N/A 05/14/2013   Procedure: LEFT AND RIGHT HEART CATHETERIZATION WITH CORONARY ANGIOGRAM;  Surgeon: Lennette Bihari, MD;  [name]; PCWP ;; [name], [name], Patent RCA ostial mid and distal stents - 70% distal runoff Dz   LUMBAR LAMINECTOMY/DECOMPRESSION MICRODISCECTOMY  01/07/2012   Procedure: LUMBAR LAMINECTOMY/DECOMPRESSION MICRODISCECTOMY 1 LEVEL;  Surgeon: Carmela Hurt, MD;  [name];  Service: Neurosurgery;  Laterality: Bilateral;   Lumbar Three-Four Decompression   LUMBAR LAMINECTOMY/DECOMPRESSION MICRODISCECTOMY N/A 07/26/2020   Procedure: Lumbar two-three Laminectomy/Foraminotomy;  Surgeon: Coletta Memos, MD;  [name];  Service: Neurosurgery;  [name];   PERCUTANEOUS CORONARY STENT INTERVENTION (PCI-S) N/A 04/28/2012   Procedure: PERCUTANEOUS CORONARY STENT INTERVENTION (  PCI-S);  Surgeon: Lennette Bihari, MD;  Location: [facility] CATH LAB;  Service: Cardiovascular;  [name];   PERCUTANEOUS CORONARY STENT INTERVENTION (PCI-S) N/A 04/29/2012   Procedure: PERCUTANEOUS CORONARY STENT INTERVENTION (PCI-S);  Surgeon: Marykay Lex, MD;  Location: [facility] CATH LAB;  Service: Cardiovascular;  [name];   PERIPHERAL VASCULAR ATHERECTOMY  11/06/2022   Procedure: PERIPHERAL VASCULAR ATHERECTOMY;  Surgeon: Iran Ouch, MD;  [facility] INVASIVE CV LAB;  Service: Cardiovascular;;   PERIPHERAL VASCULAR BALLOON ANGIOPLASTY Right 10/13/2019   Procedure: PERIPHERAL VASCULAR BALLOON ANGIOPLASTY;  Surgeon: Iran Ouch, MD;  Location: [facility] INVASIVE CV LAB;  Service: Cardiovascular;  Laterality: Right;  PTCA of  R TP Trunk into PTA (Drug-coated) => Follow-up ABIs 0.9 on the right and 0.99 on the left.   SPINE SURGERY     UMBILICAL HERNIA REPAIR  2009   steel mesh insert     A IV Location/Drains/Wounds Patient Lines/Drains/Airways Status     Active Line/Drains/Airways     Name Placement date Placement time Site Days   Peripheral IV 01/02/23 18 G Left Antecubital 01/02/23  0602  Antecubital  less than 1   Wound / Incision (Open or Dehisced) 11/06/22 Foot Anterior;Right 11/06/22  1801  Foot  57   Wound / Incision (Open or Dehisced) 11/06/22 Foot Anterior;Left 11/06/22  1804  Foot  57            Intake/Output Last 24 hours No intake or output data in the 24 hours ending 01/02/23 8242  Labs/Imaging Results for orders placed or performed during the hospital encounter of 01/02/23 (from the past 48 hour(s))  Sample to Blood Bank     Status: None   Collection Time: 01/02/23  6:22 AM  Result Value Ref Range   Blood Bank Specimen SAMPLE AVAILABLE FOR TESTING    Sample Expiration      01/05/2023,2359 Performed at [facility], [address], [place], [place] [address]   Protime-INR     Status: Abnormal   Collection Time: 01/02/23  6:24 AM  Result Value Ref Range   Prothrombin Time 16.1 (H) 11.4 - 15.2 seconds   INR 1.3 (H) 0.8 - 1.2    Comment: (NOTE) INR goal varies based on device and disease states. Performed at [facility], [address], [place], [place] [address]   CBC     Status: Abnormal   Collection Time: 01/02/23  6:24 AM  Result Value Ref Range   WBC 15.8 (H) 4.0 - 10.5 K/uL   RBC 3.89 (L) 4.22 - 5.81 MIL/uL   Hemoglobin 12.2 (L) 13.0 - 17.0 g/dL   HCT 40.0 (L) 86.7 - 61.9 %   MCV 89.5 80.0 - 100.0 fL   MCH 31.4 26.0 - 34.0 pg   MCHC 35.1 30.0 - 36.0 g/dL   RDW 50.9 32.6 - 71.2 %   Platelets 202 150 - 400 K/uL   nRBC 0.0 0.0 - 0.2 %    Comment: Performed at [facility], [address] [place], [place] [address]  Comprehensive metabolic panel      Status: Abnormal   Collection Time: 01/02/23  6:24 AM  Result Value Ref Range   Sodium 133 (L) 135 - 145 mmol/L   Potassium 3.7 3.5 - 5.1 mmol/L   Chloride 100 98 - 111 mmol/L   CO2 19 (L) 22 - 32 mmol/L   Glucose, Bld 300 (H) 70 - 99 mg/dL    Comment: Glucose  reference range applies only to samples taken after fasting for at least 8 hours.   BUN 40 (H) 8 - 23 mg/dL   Creatinine, Ser 3.38 (H) 0.61 - 1.24 mg/dL   Calcium 7.9 (L) 8.9 - 10.3 mg/dL   Total Protein 4.9 (L) 6.5 - 8.1 g/dL   Albumin 2.6 (L) 3.5 - 5.0 g/dL   AST 20 15 - 41 U/L   ALT 14 0 - 44 U/L   Alkaline Phosphatase 71 38 - 126 U/L   Total Bilirubin 0.7 <1.2 mg/dL   GFR, Estimated 17 (L) >60 mL/min    Comment: (NOTE) Calculated using the CKD-EPI Creatinine Equation (2021)    Anion gap 14 5 - 15    Comment: Performed at [facility], [address] [place], [place] [address]   *Note: Due to a large number of results and/or encounters for the requested time period, some results have not been displayed. A complete set of results can be found in Results Review.   DG Chest Portable 1 View  Result Date: 01/02/2023 CLINICAL DATA:  Preop planning. EXAM: PORTABLE CHEST 1 VIEW COMPARISON:  AP Lat chest 07/10/2022 FINDINGS: The heart is slightly enlarged. No vascular congestion is seen. The mediastinal configuration is normal. There is calcification of the transverse aorta. The lungs are clear. There is no substantial pleural effusion. Thoracic spondylosis with no new osseous findings. Compare: Stable chest. IMPRESSION: No evidence of acute chest disease. Slight cardiomegaly. Aortic atherosclerosis. Stable chest. Electronically Signed   By: Almira Bar M.D.   On: 01/02/2023 07:29   DG Ankle 2 Views Right  Result Date: 01/02/2023 CLINICAL DATA:  67 year old male history of open fracture of the right ankle status post reduction. EXAM: RIGHT ANKLE - 2 VIEW COMPARISON:  01/02/2023. FINDINGS: Compared to the prior  examination there has been substantial improvement in alignment of the previously described fracture/dislocation of the right ankle. Comminuted displaced fracture of the distal third of the fibular diaphysis is again noted, now with predominantly 7 mm of posterior displacement of the distal fracture fragments and minimal residual angulation. The distal tibia is now associated with the talus, with some mild widening of the medial aspect of the tibiotalar joint. Osseous fragment adjacent to the medial malleolus remains displaced with some varus rotation. There also appears to be an osseous defect along the anterior aspect of the distal tibia, suggesting additional fracture. Interval placement of overlying splint material obscures finer bony detail. IMPRESSION: 1. Status post closed reduction splint fixation of previously noted fracture dislocation of the right ankle with improved alignment, as above. Electronically Signed   By: Trudie Reed M.D.   On: 01/02/2023 06:36   DG Ankle 2 Views Right  Result Date: 01/02/2023 CLINICAL DATA:  67 year old male with history of open fracture of the right ankle. EXAM: RIGHT ANKLE - 2 VIEW COMPARISON:  Right ankle radiograph 09/09/2019. FINDINGS: Two views of the right ankle demonstrates severe fracture/dislocation of the right ankle. Specifically, there is a displaced, comminuted and angulated fracture of the distal third of the fibular diaphysis, with proximally 30 degrees of lateral angulation. Distal fibular fracture remains associated with the talus. Complete dislocation of the tibia relative to the talus, with the distal tibia lying completely medial to the talus and slightly posterior, likely impacted upon the bone. There is an acute displaced fracture of the medial malleolus with proximally 1.2 cm of distal migration of the distal fracture fragment. Overlying soft tissues are swollen and grossly distorted. Finer  bony detail is obscured by overlying drape materials.  IMPRESSION: 1. Severe fracture dislocation of the right ankle joint, as detailed above. Electronically Signed   By: Trudie Reed M.D.   On: 01/02/2023 06:22    Pending Labs Unresulted Labs (From admission, onward)    None       Vitals/Pain Today's Vitals   01/02/23 0650 01/02/23 0700 01/02/23 0800 01/02/23 0807  BP: 131/75 113/61 (!) 168/80   Pulse: 72 68 77   Resp: 13 16 17    Temp:    (!) 97.5 F (36.4 C)  SpO2: 96% 100% 100%   PainSc:        Isolation Precautions No active isolations  Medications Medications  ceFAZolin (ANCEF) IVPB 2g/100 mL premix (0 g Intravenous Stopped 01/02/23 0646)  Tdap (BOOSTRIX) injection 0.5 mL (0.5 mLs Intramuscular Given 01/02/23 4098)  fentaNYL (SUBLIMAZE) injection 25 mcg (25 mcg Intravenous Given 01/02/23 0657)    Mobility walks     Focused Assessments Cardiac Assessment Handoff:  Cardiac Rhythm: Normal sinus rhythm Lab Results  Component Value Date   CKTOTAL 380 (H) 05/13/2013   CKMB 6.7 (HH) 05/13/2013   TROPONINI <0.03 06/29/2014   No results found for: "DDIMER" Does the Patient currently have chest pain? No    R Recommendations: See Admitting Provider Note  Report given to:   Additional Notes: pt ambulates at baseline. Non ambulatory at the moment .

## 2023-01-02 NOTE — Anesthesia Preprocedure Evaluation (Signed)
Anesthesia Evaluation  Patient identified by MRN, date of birth, ID band Patient awake    Reviewed: Allergy & Precautions, NPO status , Patient's Chart, lab work & pertinent test results, reviewed documented beta blocker date and time   History of Anesthesia Complications (+) PONV and history of anesthetic complications  Airway Mallampati: III  TM Distance: >3 FB     Dental no notable dental hx.    Pulmonary sleep apnea and Continuous Positive Airway Pressure Ventilation , COPD, former smoker, neg PE   breath sounds clear to auscultation       Cardiovascular hypertension, + angina  + CAD, + Cardiac Stents, + Peripheral Vascular Disease and +CHF (diastolic)  (-) Past MI  Rhythm:Regular Rate:Normal     Neuro/Psych neg Seizures PSYCHIATRIC DISORDERS Anxiety Depression     Neuromuscular disease    GI/Hepatic ,GERD  ,,(+) neg Cirrhosis        Endo/Other  diabetes, Type 2Hypothyroidism    Renal/GU CRFRenal disease     Musculoskeletal   Abdominal   Peds  Hematology  (+) Blood dyscrasia, anemia   Anesthesia Other Findings   Reproductive/Obstetrics                             Anesthesia Physical Anesthesia Plan  ASA: 3  Anesthesia Plan: General   Post-op Pain Management: Regional block*   Induction: Intravenous  PONV Risk Score and Plan: 3 and Ondansetron, Dexamethasone and Scopolamine patch - Pre-op  Airway Management Planned: Oral ETT  Additional Equipment:   Intra-op Plan:   Post-operative Plan: Extubation in OR  Informed Consent: I have reviewed the patients History and Physical, chart, labs and discussed the procedure including the risks, benefits and alternatives for the proposed anesthesia with the patient or authorized representative who has indicated his/her understanding and acceptance.     Dental advisory given  Plan Discussed with: CRNA  Anesthesia Plan Comments:         Anesthesia Quick Evaluation

## 2023-01-02 NOTE — ED Provider Notes (Signed)
[place] EMERGENCY DEPARTMENT AT [facility] Provider Note   CSN: [id number] Arrival date & time: 01/02/23  0102     History  Chief Complaint  Patient presents with   Ankle Pain    Right Ankle Fracture    Harold [name] is a 67 y.o. male.  Six 72-year-old male who presents ER today with right ankle pain.  Patient states he woke up last night and he slipped hurting his right ankle.  It was swollen and painful without he just sprained it.  When he woke up this morning he heard and felt a snap and initially went to the ground.  Significant pain and bleeding from the ankle so he called EMS.  They splinted it and brought him here for further evaluation.  On the way the patient had a 50 mcg dose of fentanyl and became hypotensive and had a near syncopal episode.  That had resolved prior to arrival here.  Patient without any injuries or pain elsewhere.   Ankle Pain      Home Medications Prior to Admission medications   Medication Sig Start Date End Date Taking? Authorizing Provider  allopurinol (ZYLOPRIM) 100 MG tablet Take 200 mg by mouth 2 (two) times daily.    [provider]  amiodarone (PACERONE) 200 MG tablet Take 1 tablet (200 mg total) by mouth daily. 07/11/22   Fenton, Clint R, PA  apixaban (ELIQUIS) 5 MG TABS tablet Take 1 tablet (5 mg total) by mouth 2 (two) times daily. 06/07/22   Marykay Lex, MD  ARIPiprazole (ABILIFY) 5 MG tablet Take 5 mg by mouth daily. 07/29/22   [provider]  B-D ULTRAFINE III SHORT PEN 31G X 8 MM MISC Inject 1 each into the skin 3 (three) times daily. 11/30/19   [provider]  carvedilol (COREG) 3.125 MG tablet TAKE 1 TABLET BY MOUTH TWICE A DAY WITH A MEAL 12/13/22   Dunn, Dayna N, PA-C  COLCRYS 0.6 MG tablet Take 0.6 mg by mouth daily. 02/19/18   [provider]  Continuous Blood Gluc Sensor (FREESTYLE LIBRE 14 DAY SENSOR) MISC APPLY 1 SENSOR EVERY 14 DAYS 08/25/17   [provider]   DULoxetine (CYMBALTA) 60 MG capsule Take 60 mg by mouth daily.    [provider]  ezetimibe (ZETIA) 10 MG tablet Take 10 mg by mouth daily. 05/09/22   [provider]  furosemide (LASIX) 80 MG tablet Take 1 tablet (80 mg total) by mouth 2 (two) times daily. May take additional tablet for weight gain of 3 pounds in one day or five pounds in one week 09/24/22   Marjie Skiff E, PA-C  gabapentin (NEURONTIN) 800 MG tablet Take 1,600 mg by mouth 2 (two) times daily.    [provider]  insulin regular human CONCENTRATED (HUMULIN R U-500 KWIKPEN) 500 UNIT/ML KwikPen Inject 60 Units into the skin 2 (two) times daily with a meal. 11/07/22   Dunn, Dayna N, PA-C  levothyroxine (SYNTHROID, LEVOTHROID) 88 MCG tablet Take 88 mcg by mouth daily before breakfast.    [provider]  losartan (COZAAR) 25 MG tablet Take 12.5 mg by mouth daily.    [provider]  meclizine (ANTIVERT) 25 MG tablet Take 25-37.5 mg by mouth See admin instructions. Taking 37.5mg  by mouth twice daily and 25 mg at night 02/12/22   [provider]  mupirocin ointment (BACTROBAN) 2 % Apply 1 Application topically 2 (two) times daily as needed (wound care). 11/21/22  Edwin Cap, DPM  nitroGLYCERIN (NITROSTAT) 0.4 MG SL tablet PLACE 1 TAB UNDER THE TONGUE EVERY 5 MINUTES AS NEEDED FOR CHEST PAIN (SEVERE PRESSURE OR TIGHTNESS) 02/22/19   Marykay Lex, MD  pantoprazole (PROTONIX) 40 MG tablet TAKE 1 TABLET BY MOUTH EVERY DAY 12/13/22   Dunn, Tacey Ruiz, PA-C  potassium chloride (MICRO-K) 10 MEQ CR capsule Take 10 mEq by mouth daily.  10/19/17   [provider]  PRESCRIPTION MEDICATION Pt uses CPAP Machine at bedtime    [provider]  rosuvastatin (CRESTOR) 20 MG tablet Take 20 mg by mouth every evening.    [provider]  tirzepatide Greggory Keen) 15 MG/0.5ML Pen Inject 15 mg into the skin once a week. 12/30/22     TRESIBA FLEXTOUCH 200 UNIT/ML FlexTouch Pen  Inject 40 Units into the skin daily. 11/07/22   Dunn, Tacey Ruiz, PA-C      Allergies    Septra [bactrim], Penicillins, Metformin and related, Other, Sulfamethoxazole-trimethoprim, Wellbutrin [bupropion], and Doxycycline    Review of Systems   Review of Systems  Physical Exam Updated Vital Signs BP (!) 168/80   Pulse 77   Temp (!) 97.5 F (36.4 C)   Resp 17   SpO2 100%  Physical Exam Vitals and nursing note reviewed.  Constitutional:      Appearance: He is well-developed.  HENT:     Head: Normocephalic and atraumatic.  Eyes:     Pupils: Pupils are equal, round, and reactive to light.  Cardiovascular:     Rate and Rhythm: Normal rate.  Pulmonary:     Effort: Pulmonary effort is normal. No respiratory distress.  Abdominal:     General: There is no distension.  Musculoskeletal:        General: Deformity (Obvious open right ankle with medial dislocation of tibia pulses dopplerable, decreased cap refill distally, moves all toes to command) present. Normal range of motion.     Cervical back: Normal range of motion.  Skin:    General: Skin is warm and dry.  Neurological:     Mental Status: He is alert.     ED Results / Procedures / Treatments   Labs (all labs ordered are listed, but only abnormal results are displayed) Labs Reviewed  PROTIME-INR - Abnormal; Notable for the following components:      Result Value   Prothrombin Time 16.1 (*)    INR 1.3 (*)    All other components within normal limits  CBC - Abnormal; Notable for the following components:   WBC 15.8 (*)    RBC 3.89 (*)    Hemoglobin 12.2 (*)    HCT 34.8 (*)    All other components within normal limits  COMPREHENSIVE METABOLIC PANEL - Abnormal; Notable for the following components:   Sodium 133 (*)    CO2 19 (*)    Glucose, Bld 300 (*)    BUN 40 (*)    Creatinine, Ser 3.73 (*)    Calcium 7.9 (*)    Total Protein 4.9 (*)    Albumin 2.6 (*)    GFR, Estimated 17 (*)    All other components within  normal limits  HIV ANTIBODY (ROUTINE TESTING W REFLEX)  TSH  CK  URINALYSIS, ROUTINE W REFLEX MICROSCOPIC  CREATININE, URINE, RANDOM  UREA NITROGEN, URINE  SODIUM, URINE, RANDOM  SAMPLE TO BLOOD BANK    EKG None  Radiology DG Chest Portable 1 View  Result Date: 01/02/2023 CLINICAL DATA:  Preop planning. EXAM: PORTABLE  CHEST 1 VIEW COMPARISON:  AP Lat chest 07/10/2022 FINDINGS: The heart is slightly enlarged. No vascular congestion is seen. The mediastinal configuration is normal. There is calcification of the transverse aorta. The lungs are clear. There is no substantial pleural effusion. Thoracic spondylosis with no new osseous findings. Compare: Stable chest. IMPRESSION: No evidence of acute chest disease. Slight cardiomegaly. Aortic atherosclerosis. Stable chest. Electronically Signed   By: Almira Bar M.D.   On: 01/02/2023 07:29   DG Ankle 2 Views Right  Result Date: 01/02/2023 CLINICAL DATA:  67 year old male history of open fracture of the right ankle status post reduction. EXAM: RIGHT ANKLE - 2 VIEW COMPARISON:  01/02/2023. FINDINGS: Compared to the prior examination there has been substantial improvement in alignment of the previously described fracture/dislocation of the right ankle. Comminuted displaced fracture of the distal third of the fibular diaphysis is again noted, now with predominantly 7 mm of posterior displacement of the distal fracture fragments and minimal residual angulation. The distal tibia is now associated with the talus, with some mild widening of the medial aspect of the tibiotalar joint. Osseous fragment adjacent to the medial malleolus remains displaced with some varus rotation. There also appears to be an osseous defect along the anterior aspect of the distal tibia, suggesting additional fracture. Interval placement of overlying splint material obscures finer bony detail. IMPRESSION: 1. Status post closed reduction splint fixation of previously noted  fracture dislocation of the right ankle with improved alignment, as above. Electronically Signed   By: Trudie Reed M.D.   On: 01/02/2023 06:36   DG Ankle 2 Views Right  Result Date: 01/02/2023 CLINICAL DATA:  67 year old male with history of open fracture of the right ankle. EXAM: RIGHT ANKLE - 2 VIEW COMPARISON:  Right ankle radiograph 09/09/2019. FINDINGS: Two views of the right ankle demonstrates severe fracture/dislocation of the right ankle. Specifically, there is a displaced, comminuted and angulated fracture of the distal third of the fibular diaphysis, with proximally 30 degrees of lateral angulation. Distal fibular fracture remains associated with the talus. Complete dislocation of the tibia relative to the talus, with the distal tibia lying completely medial to the talus and slightly posterior, likely impacted upon the bone. There is an acute displaced fracture of the medial malleolus with proximally 1.2 cm of distal migration of the distal fracture fragment. Overlying soft tissues are swollen and grossly distorted. Finer bony detail is obscured by overlying drape materials. IMPRESSION: 1. Severe fracture dislocation of the right ankle joint, as detailed above. Electronically Signed   By: Trudie Reed M.D.   On: 01/02/2023 06:22    Procedures .Ortho Injury Treatment  Date/Time: 01/02/2023 6:43 AM  Performed by: Marily Memos, MD Authorized by: Marily Memos, MD   Consent:    Consent obtained:  Verbal   Consent given by:  Patient   Risks discussed:  Recurrent dislocation, irreducible dislocation, nerve damage, fracture, stiffness, restricted joint movement and vascular damage   Alternatives discussed:  No treatmentInjury location: ankle Location details: right ankle Injury type: fracture-dislocation Fracture type: medial malleolus Pre-procedure distal perfusion: diminished Pre-procedure neurological function: diminished Pre-procedure neurological function comment: Could not  fully assess secondary to injury Pre-procedure range of motion: reduced  Anesthesia: Local anesthesia used: no  Patient sedated: NoManipulation performed: yes Reduction successful: yes X-ray confirmed reduction: yes Immobilization: splint Splint type: short leg and ankle stirrup Splint Applied by: ED Provider and Ortho Tech Supplies used: Ortho-Glass Post-procedure neurovascular assessment: post-procedure neurovascularly intact Post-procedure distal perfusion: normal Post-procedure neurological function:  normal Post-procedure neurological function comment: Could not fully assess secondary to injury       Medications Ordered in ED Medications  sodium chloride flush (NS) 0.9 % injection 3 mL (has no administration in time range)  0.9 %  sodium chloride infusion (has no administration in time range)  acetaminophen (TYLENOL) tablet 650 mg (has no administration in time range)    Or  acetaminophen (TYLENOL) suppository 650 mg (has no administration in time range)  albuterol (PROVENTIL) (2.5 MG/3ML) 0.083% nebulizer solution 2.5 mg (has no administration in time range)  hydrALAZINE (APRESOLINE) injection 10 mg (has no administration in time range)  ceFAZolin (ANCEF) IVPB 2g/100 mL premix (0 g Intravenous Stopped 01/02/23 0646)  Tdap (BOOSTRIX) injection 0.5 mL (0.5 mLs Intramuscular Given 01/02/23 0613)  fentaNYL (SUBLIMAZE) injection 25 mcg (25 mcg Intravenous Given 01/02/23 1610)    ED Course/ Medical Decision Making/ A&P                                 Medical Decision Making Amount and/or Complexity of Data Reviewed Labs: ordered. Radiology: ordered. ECG/medicine tests: ordered.  Risk Prescription drug management. Decision regarding hospitalization.  Discussed with Dr. Jena Gauss who recommends medicine admission secondary to multiple comorbidities and they will see him later in the day for operative planning.  N.p.o. at this time.  Is on Eliquis so operation may not be  today however it is an open fracture so most likely it will be.  Pending labs for admission.   INR slightly elevated as expected. Cbc ok. Blood bank sent for likely OR.   Admitted to hospitalst.    Final Clinical Impression(s) / ED Diagnoses Final diagnoses:  Open right ankle fracture, type III, initial encounter    Rx / DC Orders ED Discharge Orders     None         Shoaib Siefker, Barbara Cower, MD 01/02/23 [phone]

## 2023-01-02 NOTE — Anesthesia Procedure Notes (Signed)
Anesthesia Regional Block: Popliteal block   Pre-Anesthetic Checklist: , timeout performed,  Correct Patient, Correct Site, Correct Laterality,  Correct Procedure, Correct Position, site marked,  Risks and benefits discussed,  Surgical consent,  Pre-op evaluation,  At surgeon's request and post-op pain management  Laterality: Right  Prep: Maximum Sterile Barrier Precautions used, chloraprep       Needles:  Injection technique: Single-shot  Needle Type: Echogenic Needle      Needle Gauge: 20     Additional Needles:   Procedures:,,,, ultrasound used (permanent image in chart),,    Narrative:  Start time: 01/02/2023 10:38 AM End time: 01/02/2023 10:42 AM Injection made incrementally with aspirations every 5 mL.  Performed by: Personally  Anesthesiologist: Mariann Barter, MD

## 2023-01-02 NOTE — ED Notes (Signed)
Report called to 5N

## 2023-01-02 NOTE — Progress Notes (Signed)
Initial Nutrition Assessment  DOCUMENTATION CODES:   Morbid obesity  INTERVENTION:   -Continue Carb modified diet, encourage three meals daily. -Provide protein focused nourishment with one CHO choice at HS.  -MVI/Minerals daily for micronutrient support to aide in healing.  -Revisit diet education offer at subsequent visits.   NUTRITION DIAGNOSIS:   Altered nutrition lab value related to chronic illness, other (see comment) (meal pattern at home likely not beneficial for glycemic control) as evidenced by other (comment) (BG>400).    GOAL:   Patient will meet greater than or equal to 90% of their needs    MONITOR:   PO intake, Labs, Weight trends, Skin, I & O's  REASON FOR ASSESSMENT:   Consult Assessment of nutrition requirement/status  ASSESSMENT:  67 y/o male brought to the ED after two falls at home, admitted with open ankle fracture.   PMH: anginal pain, anxiety, CAD S/P percutaneous coronary angioplasty with stent placement, carotid artery occlusion, chronic renal insufficiency stage II, chronic venous insufficiency, COPD mild, depression, diabetic neuropathy, dyslipidemia, GERD, HTN, hypothyroidism, neuromuscular disorder, neuropathy, obesity, pulmonary HTN, sleep apnea, carotid endarterectomy, umbilical hernia repair, peripheral vascular atherectomy, peripheral vascular balloon angioplasty.  Patient denies any change in his diet or appetite here and prior to admit. Lately consumes one meal daily since taking Mounjaro for the past few months which he believes the medication has decreased his appetite. Notes intentional weight loss from 335# to 315#.  Difficult to assess weight hx due to fluctuations.  Patient declines diet education for diabetic self management. Noted consult to CDE for insulin regimen. Patient has CGM.   Encouraged patient to eat well to promote healing and maintenance of lean body mass. Per review of EMR, no significant weight change identified the  past 12 months.   Medications reviewed and include novolog SS 3x daily with meals, insulin regular SS 2x daily with meals, insulin glargine-yfgn 50 units daily.  Labs: CBG 271-456 past 24 hrs, hgb A1C 7.0%,  BUN 51, creatinine 4.69, GFR 13  NUTRITION - FOCUSED PHYSICAL EXAM:  Flowsheet Row Most Recent Value  Orbital Region No depletion  Upper Arm Region No depletion  Thoracic and Lumbar Region No depletion  Buccal Region No depletion  Temple Region No depletion  Clavicle Bone Region No depletion  Clavicle and Acromion Bone Region No depletion  Scapular Bone Region No depletion  Dorsal Hand No depletion  Patellar Region No depletion  Anterior Thigh Region No depletion  Posterior Calf Region No depletion  Hair Reviewed  Eyes Reviewed  Mouth Reviewed  Skin Reviewed  Nails Reviewed       Diet Order:   Diet Order             Diet Carb Modified Fluid consistency: Thin  Diet effective now                   EDUCATION NEEDS:   Education needs have been addressed  Skin:  Skin Assessment: Reviewed RN Assessment  Last BM:  prior to admission  Height:   Ht Readings from Last 1 Encounters:  01/02/23 6\' 1"  (1.854 m)    Weight:   Wt Readings from Last 1 Encounters:  01/03/23 (!) 140.3 kg    Ideal Body Weight:  83.6 kg  BMI:  Body mass index is 40.81 kg/m.  Estimated Nutritional Needs:   Kcal:  1900-2100 kcal/day  Protein:  92-109 gm/day  Fluid:  1900-2100 mL/day    Alvino Chapel, RDLD Clinical Dietitian See AMION for  contact information

## 2023-01-02 NOTE — Plan of Care (Signed)
  Problem: Health Behavior/Discharge Planning: Goal: Ability to manage health-related needs will improve Outcome: Progressing   Problem: Metabolic: Goal: Ability to maintain appropriate glucose levels will improve Outcome: Progressing   Problem: Nutritional: Goal: Maintenance of adequate nutrition will improve Outcome: Progressing   Problem: Skin Integrity: Goal: Risk for impaired skin integrity will decrease Outcome: Progressing   

## 2023-01-02 NOTE — H&P (Signed)
History and Physical    Patient: Harold [name] DOB: [birth year] DOA: 01/02/2023 DOS: the patient was seen and examined on 01/02/2023 PCP: [name], MD  Patient coming from: Home with via EMS  Chief Complaint:  Chief Complaint  Patient presents with   Ankle Pain    Right Ankle Fracture   HPI: Harold [name] is a 67 y.o. male with medical history significant of hypertension, hyperlipidemia, CAD s/p PCI, COPD, diabetes mellitus type 2, CKD stage IIIb, peripheral vascular disease s/p stents, anxiety, depression, gout, history of tobacco abuse, obesity, and OSA on CPAP who presents after having a fall with right ankle pain.  He had recently had treatment for an abscess of his tooth.  He has been taking doxycycline along with Tylenol and ibuprofen for the pain, but notes only taking ibuprofen 1-2 times/day over the last 2 days since the procedure.  Last night around 8 PM he was walking to the room and started to feel dizzy.  As he was trying to sit on the bed to gather himself he fell off injuring his right ankle.  He was able to get back up and wiggle his toes for which he thought he may have just sprained it and went to bed thereafter.  However after getting up this morning stepping out of bed and his right ankle gave way causing him to fall again.  Patient reported having severe pain in the right ankle with bone sticking out and blood squirting wound.  He had just recently been diagnosed with atrial fibrillation about 3 to 4 months ago and started on Eliquis.  His last dose of Eliquis was yesterday evening prior to going to sleep.  Patient notes that they have been making adjustments to his medications including furosemide and Plavix was just discontinued at the beginning of this month.  Patient notes prior history of tobacco abuse, but quit back in 2005.  Patient does not drink alcohol or do any drugs.  En route with EMS patient had acute drop in blood pressures down  into the 70s after receiving 50 mcg of fentanyl and route.   In the emergency department patient was noted to have stable vital signs.  Labs significant for WBC 15.8, hemoglobin 12.2, sodium 133, CO2 19, BUN 40, creatinine 3.73, calcium 7.9, albumin 2.6, and INR 1.3.  X-rays revealed a severe fracture dislocation of the right ankle joint.  Chest x-ray showed no acute abnormality.  Patient was given patient was given Tdap booster, cefazolin IV, and fentanyl 25 mcg IV.  Fracture was reduced by the ED provider.  Repeat x-rays noted status post closed reduction and splint fixation of previously noted fracture with improved alignment.  Dr. Jena Gauss of orthopedics have been consulted.  Patient recommended to remain n.p.o. for need of surgery.  Patient had been also given tranexamic acid prior to the procedure.  Review of Systems: As mentioned in the history of present illness. All other systems reviewed and are negative. Past Medical History:  Diagnosis Date   Anginal pain [facility]) March 2015   Cardiac cath showed patent stents with distal LAD, circumflex-OM and RCA disease in small vessels.   Anxiety    CAD S/P percutaneous coronary angioplasty 2003, 04/2012   status post PCI to LAD, circumflex-OM 2, RCA   Carotid artery occlusion    Chronic renal insufficiency, stage II (mild)    Chronic venous insufficiency    varicosities, no reflux; dopplers 04/14/12- valvular insufficiency in the R and L GSV  Complication of anesthesia    COPD, mild (HCC)    Depression    situaltional    Diabetes mellitus    Diabetic neuropathy (HCC) 08/24/2019   Dyslipidemia associated with type 2 diabetes mellitus (HCC)    GERD (gastroesophageal reflux disease)    HTN (hypertension)    Hypothyroidism    Neuromuscular disorder (HCC)    neuropathy in feet   Neuropathy    notably improved following PCI with improved cardiac function   Obesity    Obesity, Class II, BMI 35.0-39.9, with comorbidity (see actual BMI)    BMI 39;  wgt loss efforts in place; seeing Dietician   PONV (postoperative nausea and vomiting)    "[employer]"   Pulmonary hypertension (HCC) 04/2012   PA pressure   Sleep apnea    uses nightly   Past Surgical History:  Procedure Laterality Date   ABDOMINAL AORTAGRAM N/A 11/15/2011   Procedure: ABDOMINAL Ronny Flurry;  Surgeon: Sherren Kerns, MD;  Location: [facility] CATH LAB;  Service: Cardiovascular;  [name];   ABDOMINAL AORTOGRAM W/LOWER EXTREMITY N/A 10/13/2019   Procedure: ABDOMINAL AORTOGRAM W/LOWER EXTREMITY;  Surgeon: Iran Ouch, MD;  Location: [facility] INVASIVE CV LAB;  Service: CV: Non-obst Ao-Iliac. R SFA-PopA patent with significant calcific stenosis of TP trunk-prox PTA (dominant) & CTO ATA  -> R PTA-TP PTCA   ABDOMINAL AORTOGRAM W/LOWER EXTREMITY N/A 11/06/2022   Procedure: ABDOMINAL AORTOGRAM W/LOWER EXTREMITY;  Surgeon: Iran Ouch, MD;  Location: [facility] INVASIVE CV LAB;  Service: Cardiovascular;  [name];   ABIs  04/27/2012   mild bilateral arterial insufficiency   BACK SURGERY  2005 x1   X2-2010   BUNIONECTOMY Right 11/11/2013   Procedure: RIGHT FOOT SILVER BUNIONECTOMY;  Surgeon: Toni Arthurs, MD;  Location: [facility];  Service: Orthopedics;  Laterality: Right;   CARDIAC CATHETERIZATION  12/11/2001   significant 3V CAD, normal LV function   CARDIOVERSION N/A 03/13/2022   Procedure: CARDIOVERSION;  Surgeon: Little Ishikawa, MD;  Location: [facility] ENDOSCOPY;  Service: Cardiovascular;  [name];   CARDIOVERSION N/A 07/25/2022   Procedure: CARDIOVERSION;  Surgeon: Jake Bathe, MD;  Location: [facility] INVASIVE CV LAB;  Service: Cardiovascular;  [name];   Carotid Doppler  04/27/2012   right internal carotid: Elevated velocities but no evidence of plaque. Left internal carotid 40-59%   CAROTID ENDARTERECTOMY  2005   Right; recent carotid Dopplers notes elevated velocities.    colonscopy     CORONARY ANGIOPLASTY  12/21/2001   PTCA of  the distal and mid AV groove circ, unsuccessful PTCA of second OM total occlusion, unsuccessful PTCA of the apical LAD total occlusion   CORONARY ANGIOPLASTY WITH STENT PLACEMENT  04/29/2012   PCI to 3 RCA lesions, Promus Premiere 2.31mmx8mm distally, mid was 2.46mx28mm and proximally 2.75x43mm, EF 55-60%   CORONARY STENT PLACEMENT  04/28/2012   PCI to LAD (3x68mm Xience DES postdilated to 3.25) and circ prox and mid (2 overlappinmg 2.31mmx12mmXience DES postdilated to 2.76mm)   DOPPLER ECHOCARDIOGRAPHY  04/28/2012   poor quality study: EF estimated 60-65%; unable to assess diastolic function (previously noted to have diastolic dysfunction); severely dilated left atrium and mild right atrium; dilated IVC consistent with increased central venous pressure.Marland Kitchen   HERNIA REPAIR     LEFT AND RIGHT HEART CATHETERIZATION WITH CORONARY ANGIOGRAM N/A 04/27/2012   Procedure: LEFT AND RIGHT HEART CATHETERIZATION WITH CORONARY ANGIOGRAM;  Surgeon: Marykay Lex, MD;  Location: [facility] CATH LAB;  Service: Cardiovascular;  Laterality:  N/A;   LEFT AND RIGHT HEART CATHETERIZATION WITH CORONARY ANGIOGRAM N/A 05/14/2013   Procedure: LEFT AND RIGHT HEART CATHETERIZATION WITH CORONARY ANGIOGRAM;  Surgeon: Lennette Bihari, MD;  [name]; PCWP ;; [name], [name], Patent RCA ostial mid and distal stents - 70% distal runoff Dz   LUMBAR LAMINECTOMY/DECOMPRESSION MICRODISCECTOMY  01/07/2012   Procedure: LUMBAR LAMINECTOMY/DECOMPRESSION MICRODISCECTOMY 1 LEVEL;  Surgeon: Carmela Hurt, MD;  [name];  Service: Neurosurgery;  Laterality: Bilateral;   Lumbar Three-Four Decompression   LUMBAR LAMINECTOMY/DECOMPRESSION MICRODISCECTOMY N/A 07/26/2020   Procedure: Lumbar two-three Laminectomy/Foraminotomy;  Surgeon: Coletta Memos, MD;  [name];  Service:  Neurosurgery;  [name];   PERCUTANEOUS CORONARY STENT INTERVENTION (PCI-S) N/A 04/28/2012   Procedure: PERCUTANEOUS CORONARY STENT INTERVENTION (PCI-S);  Surgeon: Lennette Bihari, MD;  Location: [facility] CATH LAB;  Service: Cardiovascular;  [name];   PERCUTANEOUS CORONARY STENT INTERVENTION (PCI-S) N/A 04/29/2012   Procedure: PERCUTANEOUS CORONARY STENT INTERVENTION (PCI-S);  Surgeon: Marykay Lex, MD;  Location: [facility] CATH LAB;  Service: Cardiovascular;  [name];   PERIPHERAL VASCULAR ATHERECTOMY  11/06/2022   Procedure: PERIPHERAL VASCULAR ATHERECTOMY;  Surgeon: Iran Ouch, MD;  Location: [facility] INVASIVE CV LAB;  Service: Cardiovascular;;   PERIPHERAL VASCULAR BALLOON ANGIOPLASTY Right 10/13/2019   Procedure: PERIPHERAL VASCULAR BALLOON ANGIOPLASTY;  Surgeon: Iran Ouch, MD;  Location: [facility] INVASIVE CV LAB;  Service: Cardiovascular;  Laterality: Right;  PTCA of R TP Trunk into PTA (Drug-coated) => Follow-up ABIs 0.9 on the right and 0.99 on the left.   SPINE SURGERY     UMBILICAL HERNIA REPAIR  2009   steel mesh insert   Social History:  reports that he quit smoking about 19 years ago. His smoking use included cigarettes. He started smoking about 61 years ago. He has a 42 pack-year smoking history. He has never used smokeless tobacco. He reports that he does not drink alcohol and does not use drugs.  Allergies  Allergen Reactions   Septra [Bactrim] Itching   Penicillins Hives    Allergy to All cillin drugs  Ancef given 9/24 with no obvious reaction   Metformin And Related Diarrhea and Nausea Only   Other Nausea And Vomiting    General Anesthesia - sometimes causes nausea and vomiting * OK to use scopolamine patch     Sulfamethoxazole-Trimethoprim Other (See Comments)   Wellbutrin [Bupropion]     Mood Changes   Doxycycline Hives and Rash    Family History  Adopted: Yes  Family history unknown: Yes    Prior to Admission medications   Medication Sig Start  Date End Date Taking? Authorizing Provider  allopurinol (ZYLOPRIM) 100 MG tablet Take 200 mg by mouth 2 (two) times daily.    [provider]  amiodarone (PACERONE) 200 MG tablet Take 1 tablet (200 mg total) by mouth daily. 07/11/22   Fenton, Clint R, PA  apixaban (ELIQUIS) 5 MG TABS tablet Take 1 tablet (5 mg total) by mouth 2 (two) times daily. 06/07/22   Marykay Lex, MD  ARIPiprazole (ABILIFY) 5 MG tablet Take 5 mg by mouth daily. 07/29/22   [provider]  B-D ULTRAFINE III SHORT PEN 31G X 8 MM MISC Inject 1 each into the skin 3 (three) times daily. 11/30/19   [provider]  carvedilol (COREG) 3.125 MG  tablet TAKE 1 TABLET BY MOUTH TWICE A DAY WITH A MEAL 12/13/22   Dunn, Dayna N, PA-C  COLCRYS 0.6 MG tablet Take 0.6 mg by mouth daily. 02/19/18   [provider]  Continuous Blood Gluc Sensor (FREESTYLE LIBRE 14 DAY SENSOR) MISC APPLY 1 SENSOR EVERY 14 DAYS 08/25/17   [provider]  DULoxetine (CYMBALTA) 60 MG capsule Take 60 mg by mouth daily.    [provider]  ezetimibe (ZETIA) 10 MG tablet Take 10 mg by mouth daily. 05/09/22   [provider]  furosemide (LASIX) 80 MG tablet Take 1 tablet (80 mg total) by mouth 2 (two) times daily. May take additional tablet for weight gain of 3 pounds in one day or five pounds in one week 09/24/22   Marjie Skiff E, PA-C  gabapentin (NEURONTIN) 800 MG tablet Take 1,600 mg by mouth 2 (two) times daily.    [provider]  insulin regular human CONCENTRATED (HUMULIN R U-500 KWIKPEN) 500 UNIT/ML KwikPen Inject 60 Units into the skin 2 (two) times daily with a meal. 11/07/22   Dunn, Dayna N, PA-C  levothyroxine (SYNTHROID, LEVOTHROID) 88 MCG tablet Take 88 mcg by mouth daily before breakfast.    [provider]  losartan (COZAAR) 25 MG tablet Take 12.5 mg by mouth daily.    [provider]  meclizine (ANTIVERT) 25 MG tablet Take 25-37.5 mg by mouth See admin instructions.  Taking 37.5mg  by mouth twice daily and 25 mg at night 02/12/22   [provider]  mupirocin ointment (BACTROBAN) 2 % Apply 1 Application topically 2 (two) times daily as needed (wound care). 11/21/22   McDonald, Rachelle Hora, DPM  nitroGLYCERIN (NITROSTAT) 0.4 MG SL tablet PLACE 1 TAB UNDER THE TONGUE EVERY 5 MINUTES AS NEEDED FOR CHEST PAIN (SEVERE PRESSURE OR TIGHTNESS) 02/22/19   Marykay Lex, MD  pantoprazole (PROTONIX) 40 MG tablet TAKE 1 TABLET BY MOUTH EVERY DAY 12/13/22   Dunn, Tacey Ruiz, PA-C  potassium chloride (MICRO-K) 10 MEQ CR capsule Take 10 mEq by mouth daily.  10/19/17   [provider]  PRESCRIPTION MEDICATION Pt uses CPAP Machine at bedtime    [provider]  rosuvastatin (CRESTOR) 20 MG tablet Take 20 mg by mouth every evening.    [provider]  tirzepatide Greggory Keen) 15 MG/0.5ML Pen Inject 15 mg into the skin once a week. 12/30/22     TRESIBA FLEXTOUCH 200 UNIT/ML FlexTouch Pen Inject 40 Units into the skin daily. 11/07/22   Laurann Montana, PA-C    Physical Exam: Vitals:   01/02/23 0610 01/02/23 0630 01/02/23 0650 01/02/23 0700  BP: 111/72 110/70 131/75 113/61  Pulse: 71 73 72 68  Resp: 17 13 13 16   SpO2: 99% 99% 96% 100%   Exam  Constitutional: Obese elderly male currently in no acute distress Eyes: PERRL, lids and conjunctivae normal ENMT: Mucous membranes are somewhat dry.  Fair dentition Neck: normal, supple, no significant JVD appreciated Respiratory: clear to auscultation bilaterally, no wheezing, no crackles. Normal respiratory effort. No accessory muscle use.  Patient able to talk in complete sentences Cardiovascular: Regular rate and rhythm, no murmurs / rubs / gallops.  1+ pitting edema noted of the left lower extremity right lower extremity currently in cast.  2+ pulses noted in the bilateral lower extremities Abdomen: Protuberant abdomen with no tenderness, no masses palpated.  Bowel sounds positive.  Musculoskeletal: no  clubbing / cyanosis.  Right ankle currently in splint. Skin: no rashes, lesions,  ulcers. No induration Neurologic: CN 2-12 grossly intact.  Strength 5/5 in all 4.  Psychiatric: Normal judgment and insight. Alert and oriented x 3. Normal mood.   Data Reviewed:  EKG reveals sinus rhythm at 71 bpm with nonspecific intraventricular conduction delay and QTc 562.  Reviewed labs, imaging, and pertinent records  Assessment and Plan:  Open right ankle fracture dislocation secondary to fall Acute.  Patient had fallen yesterday evening after getting dizzy and initially thought he had just sprained his ankle.  However, after getting up this morning to use the bathroom his ankle gave out and patient saw a bone sticking out of his left ankle bleeding.  Patient was given a tetanus shot and Ancef. Dr. Jena Gauss of orthopedics was consulted and recommended keeping patient n.p.o.  For need of surgical correction.  CT scan of the right ankle was pending.   -Admit to a surgical telemetry bed -N.p.o. for need of surgical procedure -Nonweightbearing on right leg -Follow-up CT scan of the right ankle -Continue empiric antibiotics of Ancef per pharmacy -Hydrocodone/Fentanyl 25 mcg IV as needed for pain -Follow-up telemetry to ensure possible arrhythmia was not a contributor to the patient's fall -Appreciate orthopedic consultative services, will follow-up for any further  Leukocytosis Acute.  WBC elevated 15.8.  Patient currently afebrile.  Suspect secondary to above, but he had also recently had abscess drained and has been on doxycycline. -Recheck CBC tomorrow morning -Continue empiric antibiotics of Ancef  Acute kidney injury superimposed on chronic kidney disease stage IIIb Patient presents with creatinine elevated up to 3.73 with BUN 40.  Baseline creatinine previously noted to be around  1.8-2.  Suspect some aspect of dehydration or overdiuresis as patient fell due to feeling lightheaded.  Patient is followed  by Dr. Marisue Humble of nephrology in outpatient setting. -Monitor intake and output -Avoid nephrotoxic agents -Check CK and urinalysis -Check urine creatinine, urine sodium, urine urea -Normal saline IV fluids at 100 mL/h  Normocytic anemia Acute.  Hemoglobin 12.2 which appears lower than prior as baseline appears to be 13-15.  Suspect drop secondary to reports of bleeding from open fracture as patient is on Eliquis. -Recheck CBC tomorrow morning  Paroxysmal atrial fibrillation on chronic anticoagulation Last dose of Eliquis yesterday evening.  INR noted to be 1.3.  Patient appears to be in sinus rhythm at this time. -Continue amiodarone -Resume Eliquis once deemed medically appropriate  Uncontrolled diabetes mellitus type 2 with hyperglycemia, with long-term use of insulin On admission blood sugar noted to be greater than 200.  Home insulin regimen appears to include hemoglobin RU 550 units in the morning and 150 units nightly, Tresiba 50 units daily, and Mounjaro 15 units weekly. -Hypoglycemic protocols -Continue gabapentin adjusted for kidney function -Pharmacy substitution of Semglee 50 units daily -CBGs before every meal with resistant SSI -Diabetic education consulted for help with glucose control  Prolonged QT interval QTc noted to be 562. -Avoid additional QT prolonging medications -Correct electrolyte abnormalities  Heart failure with preserved EF Chronic.  Last echocardiogram noted EF to be 50 to 55% with indeterminate diastolic parameters on 02/2022. -Strict I&O's and daily weight  -Continue to monitor for signs of fluid overload and resume diuretics when medically appropriate  Essential hypertension On admission blood pressures range from 110/70 to 168/80. -Continue Coreg as tolerated -Hold furosemide and losartan due to AKI -Hydralazine IV as needed for elevated blood pressures  Hypothyroidism -Check TSH -Continue levothyroxine  Coronary artery disease Peripheral  vascular disease Venous stasis ulcer Patient with  history of carotid artery stenosis requiring endarterectomy, heart stents, and stents of lower extremity.  He had just recently been taken off of Plavix due to being started on Eliquis.  He is followed in outpatient setting with wound care.  On physical exam leg ulcers appear to be healed with no significant erythema appreciated. -Continue statin and Eliquis -Continue outpatient follow-up with wound care  COPD, without exacerbation On physical exam patient without significant wheezing.  Patient has prior history of tobacco use, but quit back in 2005. -Albuterol nebs as needed shortness of breath/wheezing  Anxiety and depression -Continue aripiprazole and duloxetine  Hyperlipidemia -Continue Crestor and Zetia  Gout -Continue allopurinol with dose adjusted for kidney function and colchicine  Anxiety and depression -Continue aripiprazole and duloxetine  OSA Obesity BMI 41.56.  Patient on Mounjaro in efforts to lose weight. -Continue CPAP  DVT prophylaxis: On hold Advance Care Planning:   Code Status: Full Code Consults: Orthopedics  Family Communication: Husband updated at bedside  Severity of Illness: The appropriate patient status for this patient is INPATIENT. Inpatient status is judged to be reasonable and necessary in order to provide the required intensity of service to ensure the patient's safety. The patient's presenting symptoms, physical exam findings, and initial radiographic and laboratory data in the context of their chronic comorbidities is felt to place them at high risk for further clinical deterioration. Furthermore, it is not anticipated that the patient will be medically stable for discharge from the hospital within 2 midnights of admission.   * I certify that at the point of admission it is my clinical judgment that the patient will require inpatient hospital care spanning beyond 2 midnights from the point of  admission due to high intensity of service, high risk for further deterioration and high frequency of surveillance required.*  Author: Clydie Braun, MD 01/02/2023 7:41 AM  For on call review [email or web address].

## 2023-01-02 NOTE — Anesthesia Procedure Notes (Signed)
Anesthesia Regional Block: Adductor canal block   Pre-Anesthetic Checklist: , timeout performed,  Correct Patient, Correct Site, Correct Laterality,  Correct Procedure, Correct Position, site marked,  Risks and benefits discussed,  Surgical consent,  Pre-op evaluation,  At surgeon's request and post-op pain management  Laterality: Right  Prep: Maximum Sterile Barrier Precautions used, chloraprep       Needles:  Injection technique: Single-shot  Needle Type: Echogenic Needle      Needle Gauge: 20     Additional Needles:   Procedures:,,,, ultrasound used (permanent image in chart),,    Narrative:  Start time: 01/02/2023 10:42 AM End time: 01/02/2023 10:45 AM Injection made incrementally with aspirations every 5 mL.  Performed by: Personally  Anesthesiologist: Mariann Barter, MD

## 2023-01-02 NOTE — Op Note (Signed)
PATIENT:  Harold [name]  August 27, 1955 male   MEDICAL RECORD NUMBER: [id number]  PRE-OPERATIVE DIAGNOSIS:   1. TYPE 3A OPEN RIGHT TRIMALLEOLAR ANKLE FRACTURE 2. ANKLE DISLOCATION 3. SYNDESMOTIC DISRUPTION  POST-OPERATIVE DIAGNOSIS:   1. TYPE 3A OPEN RIGHT TRIMALLEOLAR ANKLE FRACTURE 2. ANKLE DISLOCATION 3. SYNDESMOTIC DISRUPTION 4. SAPHENOUS VEIN LACERATTION  PROCEDURE:   OPEN REDUCTION INTERNAL FIXATION OF LEFT TRIMALLEOLAR ANKLE FRACTURE WITH FIXATION OF THE POSTERIOR LIP OPEN TREATMENT OF ANKLE DISLOCATION WITH PINNING OF THE TIBIOTALAR (ANKLE) AND SUBTALAR JOINTS  DEBRIDEMENT OF OPEN FRACTURE INCLUDING BONE RIGHT ANKLE OPEN REDUCTION INTERNAL FIXATION OF RIGHT ANKLE SYNDESMOSIS LIGATION OF SAPHENOUS VEIN Retention suture closure 8 cm traumatic medial wound. MANUAL APPLICATION OF STRESS UNDER FLUOROSCOPY  SURGEON:  Doralee Albino. Carola Frost, M.D.  ASSISTANT:  1. Montez Morita, PA-C; 2. PA Student.  ANESTHESIA:  General supplemented with regional.  COMPLICATIONS:  None.  TOURNIQUET: None.  ESTIMATED BLOOD LOSS:  200 mL.  DISPOSITION:  To PACU.  CONDITION:  Intubated, hemodynamically stable.  DELAY START OF DVT PROPHYLAXIS BECAUSE OF BLEEDING RISK: NO  BRIEF SUMMARY AND INDICATIONS FOR PROCEDURE:  The patient is a 67 y.o. with multiple medical problems and obesity who sustained a fracture dislocation of the ankle fall from the bed, treated with reduction at the time of presentation to the Emergency Department, followed by splint application, which improved alignment. Patient subsequently seen in preop for definitive repair. The risks and benefits of surgery were discussed with patient and spouse including the possibility of infection, DVT, PE, nerve injury, vessel injury, loss of motion, arthritis, symptomatic hardware, heart attack, stroke and need for further surgery, among others. After acknowledging these risks, consent was provided to proceed.  SUMMARY OF PROCEDURE:  The  patient was taken to the operating room after administration of preoperative antibiotics, which included both Ancef and Vanc (because of positive screen for MRSA).  The operative lower extremity was prepped and draped in the usual sterile fashion, first with chlorhexidene and then betadine scrub and paint.   A timeout was held after draping.  I began with the open transverse medial wound which was 8 cm, but fortunately had some periosteum remaining over an otherwise bare distal tibia.  The malleolus fractures could be clearly visualized and were cleaned of hematoma with curettes.  This was supplemented with copious amounts of chlorhexidine soap and lavage being careful to avoid further traumatizing the skin while removing contaminants and hematoma.  We did excise contaminated tissues with a scalpel and directed removed small free pieces of cortical bone. A total of 6 L of fluid was used. After encountering considerable venous bleeding, further exploration revealed laceration to the saphenous vein. Using a 0-prolene suture ligation was performed of both proximal and distal bleeding branches. Once the debridement was completed, we turned our attention to fracture repair.   An incision was made directly over the lateral malleolus with careful dissection to avoid injury to the superficial peroneal nerve.  The periosteum was left intact as we continued deep dissection.  With the assistance of distal manipulation and placement of tenaculums, we were able to restore near anatomic alignment with  reduction of the primary fracture fragments. I was able to hold this interdigitated during application of a lateral malleolus plate.  We used the Zimmer 2.0 system and placed locked fixation in the shaft and distal lateral malleolus, confirming plate position with x-ray .  Final images showed appropriate reductional replacement, trajectory, and length.   Next, attention was turned to the  medial side.  Here, the traumatic  wound was used for direct access to the medial malleolus fracture site.  A pointed tenaculum was used to secure the reduction and compression.  A single cannulated screw was then used to achieve fixation. The C-arm was then brought back in and AP, lateral and mortise views showed restoration of ankle alignment reduction.    The posterior malleolar fracture fragment was then evaluated. Although it did not constitute a significant portion of the articular surface, we proceeded with fixation as it was an attachment site of the syndesmotic ligaments, and its repair contributed to stability. This was accomplished using a Biomet 5.0 mm cannulated screw overdrilling the near cortex.   Lastly, attention was turned to the dislocated ankle joint. The talus was not found to have traumatic grade 4 changes but just a visible scuff. Given the comminuted lateral and medial malleoli, debridement of the contaminated joint capsule, and severely displaced syndesmosis, a stress view of the ankle under live fluoroscopy unsurprisingly showed persistent laxity of the ankle dislocation, and so additional stabilization of the ankle dislocation was required. This was provided by placing two 2.0 mm K wires through the calcaneus talus across the tibiotalar joint and into the distal tibia, checking their position on AP lateral and mortise views of the ankle.  Wounds were irrigated once more and then closed.  Because of the compromised skin and large transverse traumatic wound a retention suture closure was necessary.  This was performed with 2-0 Nylon far-near-near-far retention sutures interspersed with for the entirety of the 8 cm wound.  On the lateral side closure was performed in standard layered fashion using 2-0 PDS and 2-0 nylon.  A sterile gently compressive dressing was applied, and then a posterior and stirrup splint with the ankle extended to neutral. Montez Morita, PA-C was present and assisting throughout. An assistant was  necessary for retraction and obtaining provisional and definitive fixation with this highly unstable fracture.   PROGNOSIS: The patient will be nonweightbearing in the splint with ice and elevation.  We will plan to see back in the office in two weeks for removal of sutures and transition into a Cam boot with eventual unrestricted range of motion after the pins are removed.  Removal of the pins at 6-8 weeks if feasible. Weightbearing at 8 weeks.

## 2023-01-02 NOTE — ED Triage Notes (Signed)
Patient Harold [name] comes from home 11/13 at 2000 he fell because he got dizzy. He thought it  was bad sprain. He states he then went to bed. Tonight when the he got up to go to the bathroom his ankle went out from under him and he started bleeding. The bone is sticking through the medial part of the ankle. Patient has neuropathy at baseline. He also is on eliquis. Tetanus shot unknown.

## 2023-01-02 NOTE — Progress Notes (Signed)
Pharmacy Antibiotic Note  Harold [name] is a 67 y.o. male admitted on 01/02/2023 with open fracture.  Pharmacy has been consulted for ancef dosing. Noted CrCl ~62mL/min, lower than baseline (1.8-2.2) in September. Will need to dose reduce if renal function gets worse.   Plan: Cefazolin 2G IV q12 hours  Height: 6\' 1"  (185.4 cm) Weight: (!) 142.9 kg (315 lb) IBW/kg (Calculated) : 79.9  Temp (24hrs), Avg:97.5 F (36.4 C), Min:97.5 F (36.4 C), Max:97.5 F (36.4 C)  Recent Labs  Lab 01/02/23 0624  WBC 15.8*  CREATININE 3.73*    Estimated Creatinine Clearance: 27.8 mL/min (A) (by C-G formula based on SCr of 3.73 mg/dL (H)).    Allergies  Allergen Reactions   Septra [Bactrim] Itching   Penicillins Rash    Allergy to All cillin drugs  Ancef given 9/24 with no obvious reaction   Metformin And Related Diarrhea and Nausea Only   Other Nausea And Vomiting    General Anesthesia - sometimes causes nausea and vomiting * OK to use scopolamine patch     Sulfamethoxazole-Trimethoprim Other (See Comments)   Wellbutrin [Bupropion]     Mood Changes   Doxycycline Hives and Rash    Thank you for allowing pharmacy to be a part of this patient's care.  Toniann Fail Morissa Obeirne 01/02/2023 8:35 AM

## 2023-01-02 NOTE — Anesthesia Procedure Notes (Signed)
Procedure Name: Intubation Date/Time: 01/02/2023 11:25 AM  Performed by: Ammie Dalton, CRNAPre-anesthesia Checklist: Patient identified, Emergency Drugs available, Suction available and Patient being monitored Patient Re-evaluated:Patient Re-evaluated prior to induction Oxygen Delivery Method: Circle System Utilized Preoxygenation: Pre-oxygenation with 100% oxygen Induction Type: IV induction Ventilation: Oral airway inserted - appropriate to patient size Laryngoscope Size: Glidescope and 4 Grade View: Grade I Tube type: Oral Tube size: 8.0 mm Number of attempts: 1 Airway Equipment and Method: Oral airway, Rigid stylet and Video-laryngoscopy Placement Confirmation: ETT inserted through vocal cords under direct vision, positive ETCO2 and breath sounds checked- equal and bilateral Secured at: 23 cm Tube secured with: Tape Dental Injury: Teeth and Oropharynx as per pre-operative assessment

## 2023-01-02 NOTE — Transfer of Care (Signed)
Immediate Anesthesia Transfer of Care Note  Patient: Harold [name]  Procedure(s) Performed: IRRIGATION AND DEBRIDEMENT RIGHT ANKLE (Right) OPEN REDUCTION INTERNAL FIXATION (ORIF)RIGHT ANKLE FRACTURE (Right: Ankle)  Patient Location: PACU  Anesthesia Type:GA combined with regional for post-op pain  Level of Consciousness: awake and patient cooperative  Airway & Oxygen Therapy: Patient Spontanous Breathing and Patient connected to face mask oxygen  Post-op Assessment: Report given to RN and Post -op Vital signs reviewed and stable  Post vital signs: Reviewed and stable  Last Vitals:  Vitals Value Taken Time  BP 98/62 01/02/23 1445  Temp    Pulse 70 01/02/23 1448  Resp 9 01/02/23 1448  SpO2 96 % 01/02/23 1448  Vitals shown include unfiled device data.  Last Pain:  Vitals:   01/02/23 0809  PainSc: 4          Complications: No notable events documented.

## 2023-01-03 ENCOUNTER — Inpatient Hospital Stay (HOSPITAL_COMMUNITY): Payer: Medicare Other

## 2023-01-03 ENCOUNTER — Other Ambulatory Visit (HOSPITAL_COMMUNITY): Payer: Self-pay

## 2023-01-03 DIAGNOSIS — S82891E Other fracture of right lower leg, subsequent encounter for open fracture type I or II with routine healing: Secondary | ICD-10-CM | POA: Diagnosis not present

## 2023-01-03 DIAGNOSIS — N179 Acute kidney failure, unspecified: Secondary | ICD-10-CM | POA: Diagnosis not present

## 2023-01-03 DIAGNOSIS — N189 Chronic kidney disease, unspecified: Secondary | ICD-10-CM | POA: Diagnosis not present

## 2023-01-03 DIAGNOSIS — I480 Paroxysmal atrial fibrillation: Secondary | ICD-10-CM | POA: Diagnosis not present

## 2023-01-03 LAB — URINALYSIS, ROUTINE W REFLEX MICROSCOPIC
Bilirubin Urine: NEGATIVE
Glucose, UA: >=500 mg/dL — AB
Ketones, ur: 5 mg/dL — AB
Leukocytes,Ua: NEGATIVE
Nitrite: NEGATIVE
Protein, ur: >=300 mg/dL — AB
Specific Gravity, Urine: 1.017 (ref 1.005–1.030)
pH: 5 (ref 5.0–8.0)

## 2023-01-03 LAB — BASIC METABOLIC PANEL
Anion gap: 15 (ref 5–15)
BUN: 51 mg/dL — ABNORMAL HIGH (ref 8–23)
CO2: 20 mmol/L — ABNORMAL LOW (ref 22–32)
Calcium: 8.1 mg/dL — ABNORMAL LOW (ref 8.9–10.3)
Chloride: 99 mmol/L (ref 98–111)
Creatinine, Ser: 4.69 mg/dL — ABNORMAL HIGH (ref 0.61–1.24)
GFR, Estimated: 13 mL/min — ABNORMAL LOW (ref >60)
Glucose, Bld: 419 mg/dL — ABNORMAL HIGH (ref 70–99)
Potassium: 4 mmol/L (ref 3.5–5.1)
Sodium: 134 mmol/L — ABNORMAL LOW (ref 135–145)

## 2023-01-03 LAB — GLUCOSE, CAPILLARY
Glucose-Capillary: 400 mg/dL — ABNORMAL HIGH (ref 70–99)
Glucose-Capillary: 456 mg/dL — ABNORMAL HIGH (ref 70–99)
Glucose-Capillary: 255 mg/dL — ABNORMAL HIGH (ref 70–99)
Glucose-Capillary: 153 mg/dL — ABNORMAL HIGH (ref 70–99)

## 2023-01-03 LAB — CBC
HCT: 27.6 % — ABNORMAL LOW (ref 39.0–52.0)
Hemoglobin: 9.7 g/dL — ABNORMAL LOW (ref 13.0–17.0)
MCH: 31.6 pg (ref 26.0–34.0)
MCHC: 35.1 g/dL (ref 30.0–36.0)
MCV: 89.9 fL (ref 80.0–100.0)
Platelets: 179 10*3/uL (ref 150–400)
RBC: 3.07 MIL/uL — ABNORMAL LOW (ref 4.22–5.81)
RDW: 14 % (ref 11.5–15.5)
WBC: 16.5 10*3/uL — ABNORMAL HIGH (ref 4.0–10.5)
nRBC: 0 % (ref 0.0–0.2)

## 2023-01-03 LAB — SODIUM, URINE, RANDOM: Sodium, Ur: <10 mmol/L

## 2023-01-03 LAB — CREATININE, URINE, RANDOM: Creatinine, Urine: 143 mg/dL

## 2023-01-03 LAB — MAGNESIUM: Magnesium: 2.1 mg/dL (ref 1.7–2.4)

## 2023-01-03 MED ORDER — INFLUENZA VAC A&B SURF ANT ADJ 0.5 ML IM SUSY
0.5000 mL | PREFILLED_SYRINGE | INTRAMUSCULAR | Status: AC
  Administered 2023-01-04: 0.5 mL via INTRAMUSCULAR
  Filled 2023-01-03: qty 0.5

## 2023-01-03 MED ORDER — INSULIN PEN NEEDLE 30G X 8 MM MISC
1.0000 | Status: DC | PRN
  Filled 2023-01-03: qty 1

## 2023-01-03 MED ORDER — INSULIN PEN NEEDLE 30G X 8 MM MISC
1.0000 | Freq: Two times a day (BID) | Status: DC
  Filled 2023-01-03: qty 1

## 2023-01-03 MED ORDER — GABAPENTIN 300 MG PO CAPS
300.0000 mg | ORAL_CAPSULE | Freq: Two times a day (BID) | ORAL | Status: DC
  Administered 2023-01-03 – 2023-01-07 (×8): 300 mg via ORAL
  Filled 2023-01-03 (×8): qty 1

## 2023-01-03 MED ORDER — INSULIN ASPART 100 UNIT/ML IJ SOLN
0.0000 [IU] | Freq: Three times a day (TID) | INTRAMUSCULAR | Status: DC
  Administered 2023-01-03: 11 [IU] via SUBCUTANEOUS
  Administered 2023-01-04: 4 [IU] via SUBCUTANEOUS
  Administered 2023-01-04 (×2): 3 [IU] via SUBCUTANEOUS

## 2023-01-03 MED ORDER — INSULIN REGULAR HUMAN (CONC) 500 UNIT/ML ~~LOC~~ SOPN
50.0000 [IU] | PEN_INJECTOR | Freq: Two times a day (BID) | SUBCUTANEOUS | Status: DC
  Administered 2023-01-03 – 2023-01-07 (×9): 50 [IU] via SUBCUTANEOUS
  Filled 2023-01-03: qty 3

## 2023-01-03 MED ORDER — APIXABAN 5 MG PO TABS
5.0000 mg | ORAL_TABLET | Freq: Two times a day (BID) | ORAL | Status: DC
  Administered 2023-01-03 – 2023-01-07 (×9): 5 mg via ORAL
  Filled 2023-01-03 (×9): qty 1

## 2023-01-03 MED ORDER — ADULT MULTIVITAMIN W/MINERALS CH
1.0000 | ORAL_TABLET | Freq: Every day | ORAL | Status: DC
  Administered 2023-01-03 – 2023-01-07 (×5): 1 via ORAL
  Filled 2023-01-03 (×5): qty 1

## 2023-01-03 MED ORDER — ROSUVASTATIN CALCIUM 5 MG PO TABS
10.0000 mg | ORAL_TABLET | Freq: Every evening | ORAL | Status: DC
  Administered 2023-01-03 – 2023-01-06 (×4): 10 mg via ORAL
  Filled 2023-01-03 (×6): qty 2

## 2023-01-03 MED ORDER — ORAL CARE MOUTH RINSE: 15.0000 mL | OROMUCOSAL | Status: DC | PRN

## 2023-01-03 MED ORDER — INSULIN ASPART 100 UNIT/ML IJ SOLN: 0.0000 [IU] | Freq: Every day | INTRAMUSCULAR | Status: DC

## 2023-01-03 MED ORDER — ALUM & MAG HYDROXIDE-SIMETH 200-200-20 MG/5ML PO SUSP
30.0000 mL | ORAL | Status: DC | PRN
  Administered 2023-01-03: 30 mL via ORAL
  Filled 2023-01-03: qty 30

## 2023-01-03 NOTE — Progress Notes (Signed)
[facility] Progress Note  Patient ID: Harold [name] MRN: [id number] DOB/AGE: 22-Aug-1955 67 y.o.  Subjective:  Pain controlled as block is still working  Baseline peripheral neuropathy both lets.  Normal sensation noted around upper/mid 1/3 of lower leg. Everything distal is diminished  Uses a cane when out in the community. No ambulatory devices around the house  Single story house but very narrow hallways   Increased incidence of falls since the spring.  Has been going to therapy for balance and strengthening    ROS As above  Objective:   VITALS:   Vitals:   01/02/23 2332 01/03/23 0417 01/03/23 0500 01/03/23 0801  BP: (!) 111/56 (!) 141/66  123/62  Pulse: 73 81  71  Resp: 18 18  17   Temp: 98 F (36.7 C) 98.6 F (37 C)  97.6 F (36.4 C)  TempSrc: Oral Oral    SpO2: 93% 94%  96%  Weight:   (!) 140.3 kg   Height:        Estimated body mass index is 40.81 kg/m as calculated from the following:   Height as of this encounter: 6\' 1"  (1.854 m).   Weight as of this encounter: 140.3 kg.   Intake/Output      11/14 0701 11/15 0700 11/15 0701 11/16 0700   P.O. 560    I.V. (mL/kg) 850 (6.1)    IV Piggyback 750    Total Intake(mL/kg) 2160 (15.4)    Urine (mL/kg/hr) 325 (0.1)    Blood 200    Total Output 525    Net +1635         Urine Occurrence 1 x      LABS  Results for orders placed or performed during the hospital encounter of 01/02/23 (from the past 24 hour(s))  Glucose, capillary     Status: Abnormal   Collection Time: 01/02/23  2:44 PM  Result Value Ref Range   Glucose-Capillary 273 (H) 70 - 99 mg/dL  Glucose, capillary     Status: Abnormal   Collection Time: 01/02/23  4:16 PM  Result Value Ref Range   Glucose-Capillary 272 (H) 70 - 99 mg/dL  HIV Antibody (routine testing w rflx)     Status: None   Collection Time: 01/02/23  4:44 PM  Result Value Ref Range    HIV Screen 4th Generation wRfx Non Reactive Non Reactive  TSH     Status: None   Collection Time: 01/02/23  4:44 PM  Result Value Ref Range   TSH 3.387 0.350 - 4.500 uIU/mL  CK     Status: None   Collection Time: 01/02/23  4:44 PM  Result Value Ref Range   Total CK 251 49 - 397 U/L  Hemoglobin A1c     Status: Abnormal   Collection Time: 01/02/23  4:45 PM  Result Value Ref Range   Hgb A1c MFr Bld 7.0 (H) 4.8 - 5.6 %   Mean Plasma Glucose 154.2 mg/dL  Glucose, capillary     Status: Abnormal   Collection Time: 01/02/23  8:40 PM  Result Value Ref Range   Glucose-Capillary 322 (H) 70 - 99 mg/dL  CBC     Status: Abnormal   Collection Time: 01/03/23  6:45 AM  Result Value Ref Range   WBC 16.5 (H) 4.0 - 10.5 K/uL  RBC 3.07 (L) 4.22 - 5.81 MIL/uL   Hemoglobin 9.7 (L) 13.0 - 17.0 g/dL   HCT 57.8 (L) 46.9 - 62.9 %   MCV 89.9 80.0 - 100.0 fL   MCH 31.6 26.0 - 34.0 pg   MCHC 35.1 30.0 - 36.0 g/dL   RDW 52.8 41.3 - 24.4 %   Platelets 179 150 - 400 K/uL   nRBC 0.0 0.0 - 0.2 %  Basic metabolic panel     Status: Abnormal   Collection Time: 01/03/23  6:45 AM  Result Value Ref Range   Sodium 134 (L) 135 - 145 mmol/L   Potassium 4.0 3.5 - 5.1 mmol/L   Chloride 99 98 - 111 mmol/L   CO2 20 (L) 22 - 32 mmol/L   Glucose, Bld 419 (H) 70 - 99 mg/dL   BUN 51 (H) 8 - 23 mg/dL   Creatinine, Ser 0.10 (H) 0.61 - 1.24 mg/dL   Calcium 8.1 (L) 8.9 - 10.3 mg/dL   GFR, Estimated 13 (L) >60 mL/min   Anion gap 15 5 - 15  Glucose, capillary     Status: Abnormal   Collection Time: 01/03/23  7:44 AM  Result Value Ref Range   Glucose-Capillary 400 (H) 70 - 99 mg/dL   *Note: Due to a large number of results and/or encounters for the requested time period, some results have not been displayed. A complete set of results can be found in Results Review.     PHYSICAL EXAM:   Gen: resting comfortably in bed, NAD, looks good, pleasant Lungs: unlabored Ext:       Right Lower Extremity Splint fitting  well, splint is well padded  Dressing is clean, dry and intact  Extremity is warm  No DCT  Compartments are soft  No pain out of proportion with passive stretching of his toes or ankle  Faint toe motion   No sensation due to block   + DP pulse   Assessment/Plan: 1 Day Post-Op   Principal Problem:   Open ankle fracture Active Problems:   Essential hypertension   Anxiety and depression   CAD, LAD/CFX DES 04/28/12- staged RCA DES 04/29/12 after pt declined CABG, cath 05/14/13 stable CAD medical therapy   OSA on CPAP   Obesity, Class III, BMI 40-49.9 (morbid obesity) (HCC)   COPD (chronic obstructive pulmonary disease) (HCC)   Chronic heart failure with preserved ejection fraction (HFpEF) (HCC)   PVD (peripheral vascular disease) (HCC)   Prolonged QT interval   Fall at home, initial encounter   Leukocytosis   Acute kidney injury superimposed on chronic kidney disease (HCC)   Normocytic anemia   Paroxysmal atrial fibrillation (HCC)   Uncontrolled type 2 diabetes mellitus with hyperglycemia, with long-term current use of insulin (HCC)   Hypothyroidism   Anti-infectives (From admission, onward)    Start     Dose/Rate Route Frequency Ordered Stop   01/02/23 1700  cefTRIAXone (ROCEPHIN) 2 g in sodium chloride 0.9 % 100 mL IVPB        2 g 200 mL/hr over 30 Minutes Intravenous Every 24 hours 01/02/23 1559 01/05/23 1659   01/02/23 1219  vancomycin (VANCOCIN) powder  Status:  Discontinued          As needed 01/02/23 1219 01/02/23 1442   01/02/23 1000  ceFAZolin (ANCEF) IVPB 2g/100 mL premix  Status:  Discontinued        2 g 200 mL/hr over 30 Minutes Intravenous Every 12 hours 01/02/23 0835 01/02/23 1628  01/02/23 1000  ceFAZolin (ANCEF) IVPB 3g/100 mL premix        3 g 200 mL/hr over 30 Minutes Intravenous On call to O.R. 01/02/23 0909 01/02/23 1158   01/02/23 0615  ceFAZolin (ANCEF) IVPB 2g/100 mL premix        2 g 200 mL/hr over 30 Minutes Intravenous  Once 01/02/23 0607  01/02/23 0646     .  POD/HD#: 1  67 y/o male with complex medical history s/p fall with open right ankle fracture dislocation  - ground level fall  -Open right trimalleolar ankle fracture dislocation including syndesmosis s/p I&D and ORIF of fibula, medial malleolus, posterior malleolus and syndesmosis Weightbearing Nonweightbearing right leg for 8 weeks Mobilize with walker   ROM/Activity   No ankle motion.  Total knee motion as tolerated    Slowly increase activity level while maintaining weightbearing restrictions noted above   Wound care   , Patient will remain in his splint for 2 weeks and then will be converted to a short leg cast as he has 2 tibial talocalcaneal pins in place.  Will likely leave these pins in for 4 weeks and remove in the office   PT and OT evaluations   Patient agreeable to rehab postacute hospital stay  - Pain management:  Multimodal    - ABL anemia/Hemodynamics  Stable  - Medical issues   Per primary   - DVT/PE prophylaxis:  Resume home Eliquis - ID:   Rocephin for open fracture x 72 hours  - Metabolic Bone Disease:  Fracture is a fragility fracture  Multifactorial metabolic bone disease  Check vitamin D  - Activity:  As above  - FEN/GI prophylaxis/Foley/Lines:  Carb mod diet  -Ex-fix/Splint care:  Maintain splint until office follow-up  - Impediments to fracture healing:  Open fracture  Poorly controlled diabetes  COPD   - Dispo:  Therapy evals  TOC consult for SNF  Will have CIR take a look as well to see if he might qualify    Mearl Latin, PA-C [phone] (C) 01/03/2023, 10:29 AM  [facility] [address] [place] [address] [phone] [phone] (F)    After 5pm and on the weekends please log on to Amion, go to orthopaedics and the look under the [facility] Call for the provider(s) on call. You can also call our office at [phone] and then follow the prompts  to be connected to the call team.  Patient ID: Harold [name], male   DOB: 1955-09-23, 67 y.o.   MRN: [id number]

## 2023-01-03 NOTE — TOC CAGE-AID Note (Signed)
Transition of Care [facility]) - CAGE-AID Screening  Patient Details  Name: Harold [name] MRN: [id number] Date of Birth: 1955-09-27  [facility]) CM/SW Contact:    Vanessa Barbara, RN Phone Number: 01/03/2023, 5:01 PM  Clinical Narrative:  Patient denies any current alcohol or drug use, denies need for substance abuse resources at this time.  CAGE-AID Screening:   Have You Ever Felt You Ought to Cut Down on Your Drinking or Drug Use?: No Have People Annoyed You By Critizing Your Drinking Or Drug Use?: No Have You Felt Bad Or Guilty About Your Drinking Or Drug Use?: No Have You Ever Had a Drink or Used Drugs First Thing In The Morning to Steady Your Nerves or to Get Rid of a Hangover?: No CAGE-AID Score: 0  Substance Abuse Education Offered: No

## 2023-01-03 NOTE — Progress Notes (Signed)
Fracture Care Post-op Anticoagulation Consult Note  Brief Assessment: 50 yom s/p ORIF ankle fracture and debridement. Pharmacy consulted to resume apixaban if POD 1 Hgb >=8.5.  Hgb is 9.7 (pre-op 12.2) and platelets are normal. No bleeding noted.  Plan: Resume apixaban 5 mg PO bid Pharmacy signing off but will continue to follow peripherally - please re-consult if needed  Thank you for involving pharmacy in this patient's care.  Loura Back, PharmD, BCPS Clinical Pharmacist Clinical phone for 01/03/2023 is [phone] 01/03/2023 7:48 AM

## 2023-01-03 NOTE — Evaluation (Signed)
Physical Therapy Evaluation Patient Details Name: Harold [name] MRN: [id number] DOB: Feb 02, 1956 Today's Date: 01/03/2023  History of Present Illness  Pt is a 67 y/o male presenting on 11/14 after a fall. Found with open R ankle fx, s/p I&D, ORIF L ankle. Significant PMH: HTN, HLD, CAD s/p PCI, COPD, DM2, CKD stage IIIb, PVD, gout, OSA.  Clinical Impression  PTA, pt lives with his husband, is ambulatory and requires assist for lower body ADL's. Pt presents with decreased functional mobility secondary to weightbearing precautions, generalized weakness, impaired balance. Pt transitioned to edge of bed with CGA. Unable to stand from edge of bed while maintaining precautions with +2 assist. Worked on lateral scooting edge of bed. Would benefit from trialing slide board transfer next session. Patient will benefit from continued inpatient follow up therapy, <3 hours/day.        If plan is discharge home, recommend the following: Two people to help with walking and/or transfers;A lot of help with bathing/dressing/bathroom   Can travel by private vehicle   No    Equipment Recommendations Hoyer lift;Wheelchair (measurements PT);Wheelchair cushion (measurements PT)  Recommendations for Other Services       Functional Status Assessment Patient has had a recent decline in their functional status and demonstrates the ability to make significant improvements in function in a reasonable and predictable amount of time.     Precautions / Restrictions Precautions Precautions: Fall Restrictions Weight Bearing Restrictions: Yes RLE Weight Bearing: Non weight bearing      Mobility  Bed Mobility Overal bed mobility: Needs Assistance Bed Mobility: Supine to Sit     Supine to sit: Contact guard     General bed mobility comments: Close guarding of RLE    Transfers Overall transfer level: Needs assistance                 General transfer comment: Pt unable to transition to  standing with +2 assist while maintaining weightbearing precautions. Worked on lateral scooting edge of bed towards left with use of bed pad to guide hips.    Ambulation/Gait                  Stairs            Wheelchair Mobility     Tilt Bed    Modified Rankin (Stroke Patients Only)       Balance Overall balance assessment: Needs assistance Sitting-balance support: Feet unsupported Sitting balance-Leahy Scale: Fair                                       Pertinent Vitals/Pain Pain Assessment Pain Assessment: Faces Faces Pain Scale: Hurts a little bit Pain Location: R ankle Pain Descriptors / Indicators: Discomfort, Operative site guarding Pain Intervention(s): Monitored during session, Limited activity within patient's tolerance    Home Living Family/patient expects to be discharged to:: Private residence Living Arrangements: Spouse/significant other Available Help at Discharge: Family Type of Home: House Home Access: Stairs to enter Entrance Stairs-Rails: None Entrance Stairs-Number of Steps: 3   Home Layout: One level Home Equipment: Shower seat      Prior Function Prior Level of Function : History of Falls (last six months);Needs assist (8 falls in the last 6 months)             Mobility Comments: no AD, hx 20-30 falls ADLs Comments: reports needing assist for LB ADLs from significant  other, supervision for bathing and assist for tub transfers     Extremity/Trunk Assessment   Upper Extremity Assessment Upper Extremity Assessment: Defer to OT evaluation    Lower Extremity Assessment Lower Extremity Assessment: RLE deficits/detail;LLE deficits/detail RLE Deficits / Details: ankle fx s/p ORIF. Unable to wiggle toes quite yet, hip/knee ROM WFL. LLE Deficits / Details: Necrotic wounds x 2 dorsum of foot    Cervical / Trunk Assessment Cervical / Trunk Assessment: Other exceptions Cervical / Trunk Exceptions: increased body  habitus  Communication   Communication Communication: No apparent difficulties  Cognition Arousal: Alert Behavior During Therapy: WFL for tasks assessed/performed Overall Cognitive Status: Within Functional Limits for tasks assessed                                          General Comments      Exercises     Assessment/Plan    PT Assessment Patient needs continued PT services  PT Problem List Decreased strength;Decreased activity tolerance;Decreased balance;Decreased mobility;Pain       PT Treatment Interventions DME instruction;Functional mobility training;Therapeutic activities;Therapeutic exercise;Balance training;Patient/family education    PT Goals (Current goals can be found in the Care Plan section)  Acute Rehab PT Goals Patient Stated Goal: go to rehab PT Goal Formulation: With patient/family Time For Goal Achievement: 01/17/23 Potential to Achieve Goals: Good    Frequency Min 1X/week     Co-evaluation               AM-PAC PT "6 Clicks" Mobility  Outcome Measure Help needed turning from your back to your side while in a flat bed without using bedrails?: A Little Help needed moving from lying on your back to sitting on the side of a flat bed without using bedrails?: A Little Help needed moving to and from a bed to a chair (including a wheelchair)?: Total Help needed standing up from a chair using your arms (e.g., wheelchair or bedside chair)?: Total Help needed to walk in hospital room?: Total Help needed climbing 3-5 steps with a railing? : Total 6 Click Score: 10    End of Session Equipment Utilized During Treatment: Gait belt Activity Tolerance: Patient tolerated treatment well Patient left: in bed;with call bell/phone within reach Nurse Communication: Mobility status PT Visit Diagnosis: Other abnormalities of gait and mobility (R26.89)    Time: 0825-0906 PT Time Calculation (min) (ACUTE ONLY): 41 min   Charges:   PT  Evaluation $PT Eval Moderate Complexity: 1 Mod PT Treatments $Therapeutic Activity: 8-22 mins PT General Charges $$ ACUTE PT VISIT: 1 Visit         Lillia Pauls, PT, DPT Acute Rehabilitation Services Office [phone]   Norval Morton 01/03/2023, 9:26 AM

## 2023-01-03 NOTE — Consult Note (Signed)
[facility]  INPATIENT CONSULTATION  Reason for Consultation: AKI on CKD Requesting Provider: Dr. Waymon Amato  HPI: Harold [name] is an 67 y.o. male HTN, HLD, CAD s/p PCI, COPD, DM2, CKD stage IIIb, PVD, gout, OSA currently admitted with an open ankle fracture and nephrology is consulted for AKI on CKD.   Presented to ED yesterday via EMS with open ankle fracture.  En route had hypotension (? Measurement) with near syncope after fentanyl.  IN ED BP 90/50 lowest.  Had operative repair yesterday.   Baseline CKD Cr in the 1.8-2.2 range recently f/b Dr. Marisue Humble.  Cr yesterday on arrival 3.7 and today 4.7. Hb 12.2 > 9.7 today. No NSAIDs.  No contrast.  Diuretics were held and he's been on 0.9% NS 100/hr since yesterday.   I/Os 2.2 / 0.325 yesterday.   Denies f/c/edema/CP/dyspnea/NSAID use. No difficulty voiding - using bedside urinal but UOP has not been measured today - says at least 2 good voids, est total.   From [facility], married husband 10y ago together 30y. 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  PMH: Past Medical History:  Diagnosis Date   Anginal pain (HCC) 04/2013   Cardiac cath showed patent stents with distal LAD, circumflex-OM and RCA disease in small vessels.   Anxiety    Atrial fibrillation (HCC) 12/2021   CAD S/P percutaneous coronary angioplasty 2003, 04/2012   status post PCI to LAD, circumflex-OM 2, RCA   Carotid artery occlusion    Chronic renal insufficiency, stage II (mild)    Chronic venous insufficiency    varicosities, no reflux; dopplers 04/14/12- valvular insufficiency in the R and L GSV   Complication of anesthesia    COPD, mild (HCC)    Depression    situaltional    Diabetes mellitus    Diabetic neuropathy (HCC) 08/24/2019   Dyslipidemia associated with type 2 diabetes mellitus (HCC)    GERD (gastroesophageal reflux disease)    History of cardioversion 12/2021   at [facility]   HTN (hypertension)     Hypothyroidism    Neuromuscular disorder (HCC)    neuropathy in feet   Neuropathy    notably improved following PCI with improved cardiac function   Obesity    Obesity, Class II, BMI 35.0-39.9, with comorbidity (see actual BMI)    BMI 39; wgt loss efforts in place; seeing Dietician   PONV (postoperative nausea and vomiting)    "Patch Works"   Pulmonary hypertension (HCC) 04/2012   PA pressure   Sleep apnea    uses nightly   PSH: Past Surgical History:  Procedure Laterality Date   ABDOMINAL AORTAGRAM N/A 11/15/2011   Procedure: ABDOMINAL Ronny Flurry;  Surgeon: Sherren Kerns, MD;  Location: [facility] CATH LAB;  Service: Cardiovascular;  [name];   ABDOMINAL AORTOGRAM W/LOWER EXTREMITY N/A 10/13/2019   Procedure: ABDOMINAL AORTOGRAM W/LOWER EXTREMITY;  Surgeon: Iran Ouch, MD;  Location: [facility] INVASIVE CV LAB;  Service: CV: Non-obst Ao-Iliac. R SFA-PopA patent with significant calcific stenosis of TP trunk-prox PTA (dominant) & CTO ATA  -> R PTA-TP PTCA   ABDOMINAL AORTOGRAM W/LOWER EXTREMITY N/A 11/06/2022   Procedure: ABDOMINAL AORTOGRAM W/LOWER EXTREMITY;  Surgeon: Iran Ouch, MD;  Location: [facility] INVASIVE CV LAB;  Service: Cardiovascular;  [name];   ABIs  04/27/2012   mild bilateral arterial insufficiency   BACK SURGERY  2005 x1   X2-2010   BUNIONECTOMY Right 11/11/2013   Procedure: RIGHT FOOT SILVER BUNIONECTOMY;  Surgeon: Toni Arthurs, MD;  Location: [facility];  Service: Orthopedics;  Laterality: Right;   CARDIAC CATHETERIZATION  12/11/2001   significant 3V CAD, normal LV function   CARDIOVERSION N/A 03/13/2022   Procedure: CARDIOVERSION;  Surgeon: Little Ishikawa, MD;  Location: [facility] ENDOSCOPY;  Service: Cardiovascular;  [name];   CARDIOVERSION N/A 07/25/2022   Procedure: CARDIOVERSION;  Surgeon: Jake Bathe, MD;  [facility] INVASIVE CV LAB;  Service: Cardiovascular;  [name];   Carotid Doppler  04/27/2012    right internal carotid: Elevated velocities but no evidence of plaque. Left internal carotid 40-59%   CAROTID ENDARTERECTOMY  2005   Right; recent carotid Dopplers notes elevated velocities.    colonscopy     CORONARY ANGIOPLASTY  12/21/2001   PTCA of the distal and mid AV groove circ, unsuccessful PTCA of second OM total occlusion, unsuccessful PTCA of the apical LAD total occlusion   CORONARY ANGIOPLASTY WITH STENT PLACEMENT  04/29/2012   PCI to 3 RCA lesions, Promus Premiere 2.56mmx8mm distally, mid was 2.13mx28mm and proximally 2.75x54mm, EF 55-60%   CORONARY STENT PLACEMENT  04/28/2012   PCI to LAD (3x63mm Xience DES postdilated to 3.25) and circ prox and mid (2 overlappinmg 2.48mmx12mmXience DES postdilated to 2.79mm)   DOPPLER ECHOCARDIOGRAPHY  04/28/2012   poor quality study: EF estimated 60-65%; unable to assess diastolic function (previously noted to have diastolic dysfunction); severely dilated left atrium and mild right atrium; dilated IVC consistent with increased central venous pressure.Marland Kitchen   HERNIA REPAIR     LEFT AND RIGHT HEART CATHETERIZATION WITH CORONARY ANGIOGRAM N/A 04/27/2012   Procedure: LEFT AND RIGHT HEART CATHETERIZATION WITH CORONARY ANGIOGRAM;  Surgeon: Marykay Lex, MD;  Location: [facility] CATH LAB;  Service: Cardiovascular;  [name];   LEFT AND RIGHT HEART CATHETERIZATION WITH CORONARY ANGIOGRAM N/A 05/14/2013   Procedure: LEFT AND RIGHT HEART CATHETERIZATION WITH CORONARY ANGIOGRAM;  Surgeon: Lennette Bihari, MD;  [name]; PCWP ;; [name], [name], Patent RCA ostial mid and distal stents - 70% distal runoff Dz   LUMBAR LAMINECTOMY/DECOMPRESSION MICRODISCECTOMY  01/07/2012   Procedure: LUMBAR LAMINECTOMY/DECOMPRESSION MICRODISCECTOMY 1 LEVEL;  Surgeon: Carmela Hurt, MD;  [name];  Service:  Neurosurgery;  Laterality: Bilateral;   Lumbar Three-Four Decompression   LUMBAR LAMINECTOMY/DECOMPRESSION MICRODISCECTOMY N/A 07/26/2020   Procedure: Lumbar two-three Laminectomy/Foraminotomy;  Surgeon: Coletta Memos, MD;  [name];  Service: Neurosurgery;  [name];   PERCUTANEOUS CORONARY STENT INTERVENTION (PCI-S) N/A 04/28/2012   Procedure: PERCUTANEOUS CORONARY STENT INTERVENTION (PCI-S);  Surgeon: Lennette Bihari, MD;  Location: [facility] C[facility] LAB;  Service: Cardiovascular;  [name];   PERCUTANEOUS CORONARY STENT INTERVENTION (PCI-S) N/A 04/29/2012   Procedure: PERCUTANEOUS CORONARY STENT INTERVENTION (PCI-S);  Surgeon: Marykay Lex, MD;  Location: [facility] CATH LAB;  Service: Cardiovascular;  [name];   PERIPHERAL VASCULAR ATHERECTOMY  11/06/2022   Procedure: PERIPHERAL VASCULAR ATHERECTOMY;  Surgeon: Iran Ouch, MD;  [facility] CV LAB;  Service: Cardiovascular;;   PERIPHERAL VASCULAR BALLOON ANGIOPLASTY Right 10/13/2019   Procedure: PERIPHERAL VASCULAR BALLOON ANGIOPLASTY;  Surgeon: Iran Ouch, MD;  [facility] LAB;  Service: Cardiovascular;  Laterality: Right;  PTCA of R TP Trunk into PTA (Drug-coated) => Follow-up ABIs 0.9 on the right and 0.99 on the left.   SPINE SURGERY     UMBILICAL HERNIA  REPAIR  2009   steel mesh insert    Past Medical History:  Diagnosis Date   Anginal pain (HCC) 04/2013   Cardiac cath showed patent stents with distal LAD, circumflex-OM and RCA disease in small vessels.   Anxiety    Atrial fibrillation (HCC) 12/2021   CAD S/P percutaneous coronary angioplasty 2003, 04/2012   status post PCI to LAD, circumflex-OM 2, RCA   Carotid artery occlusion    Chronic renal insufficiency, stage II (mild)    Chronic venous insufficiency    varicosities, no reflux; dopplers 04/14/12- valvular insufficiency in the R and L GSV   Complication of anesthesia    COPD, mild (HCC)    Depression    situaltional     Diabetes mellitus    Diabetic neuropathy (HCC) 08/24/2019   Dyslipidemia associated with type 2 diabetes mellitus (HCC)    GERD (gastroesophageal reflux disease)    History of cardioversion 12/2021   at [facility]   HTN (hypertension)    Hypothyroidism    Neuromuscular disorder (HCC)    neuropathy in feet   Neuropathy    notably improved following PCI with improved cardiac function   Obesity    Obesity, Class II, BMI 35.0-39.9, with comorbidity (see actual BMI)    BMI 39; wgt loss efforts in place; seeing Dietician   PONV (postoperative nausea and vomiting)    "Patch Works"   Pulmonary hypertension (HCC) 04/2012   PA pressure   Sleep apnea    uses nightly    Medications:  I have reviewed the patient's current medications.  Medications Prior to Admission  Medication Sig Dispense Refill   acetaminophen (TYLENOL) 500 MG tablet Take 500 mg by mouth every 6 (six) hours as needed for mild pain (pain score 1-3) or moderate pain (pain score 4-6).     allopurinol (ZYLOPRIM) 100 MG tablet Take 200 mg by mouth 2 (two) times daily.     amiodarone (PACERONE) 200 MG tablet Take 1 tablet (200 mg total) by mouth daily. 90 tablet 1   apixaban (ELIQUIS) 5 MG TABS tablet Take 1 tablet (5 mg total) by mouth 2 (two) times daily. 28 tablet 0   ARIPiprazole (ABILIFY) 5 MG tablet Take 5 mg by mouth daily.     carvedilol (COREG) 3.125 MG tablet TAKE 1 TABLET BY MOUTH TWICE A DAY WITH A MEAL 180 tablet 3   COLCRYS 0.6 MG tablet Take 0.6 mg by mouth daily.     doxycycline (VIBRAMYCIN) 100 MG capsule Take 200-300 mg by mouth See admin instructions. Take 300mg  by mouth on day 1 and then take 200mg  by mouth once a day for 7 days.     DULoxetine (CYMBALTA) 60 MG capsule Take 60 mg by mouth daily.     ezetimibe (ZETIA) 10 MG tablet Take 10 mg by mouth daily.     furosemide (LASIX) 80 MG tablet Take 1 tablet (80 mg total) by mouth 2 (two) times daily. May take additional tablet for weight gain of 3  pounds in one day or five pounds in one week 180 tablet 3   gabapentin (NEURONTIN) 800 MG tablet Take 1,600 mg by mouth 2 (two) times daily.     ibuprofen (ADVIL) 200 MG tablet Take 600 mg by mouth every 6 (six) hours as needed for mild pain (pain score 1-3) or moderate pain (pain score 4-6).     insulin regular human CONCENTRATED (HUMULIN R U-500 KWIKPEN) 500 UNIT/ML KwikPen Inject 60 Units into the  skin 2 (two) times daily with a meal. (Patient taking differently: Inject 50-150 Units into the skin See admin instructions. Inject 50 units subcutaneously with breakfast and inject 150 units subcutaneously with dinner.)     levothyroxine (SYNTHROID, LEVOTHROID) 88 MCG tablet Take 88 mcg by mouth daily before breakfast.     losartan (COZAAR) 25 MG tablet Take 25 mg by mouth daily.     meclizine (ANTIVERT) 25 MG tablet Take 25-37.5 mg by mouth See admin instructions. Take 25mg  by mouth once in the morning, 25mg  by mouth once in the afternoon and 37.5mg  by mouth once in the evening.     mupirocin ointment (BACTROBAN) 2 % Apply 1 Application topically 2 (two) times daily as needed (wound care). 30 g 2   nitroGLYCERIN (NITROSTAT) 0.4 MG SL tablet PLACE 1 TAB UNDER THE TONGUE EVERY 5 MINUTES AS NEEDED FOR CHEST PAIN (SEVERE PRESSURE OR TIGHTNESS) 25 tablet 3   pantoprazole (PROTONIX) 40 MG tablet TAKE 1 TABLET BY MOUTH EVERY DAY 90 tablet 3   potassium chloride (MICRO-K) 10 MEQ CR capsule Take 10 mEq by mouth daily.   5   rosuvastatin (CRESTOR) 20 MG tablet Take 20 mg by mouth every evening.     tirzepatide (MOUNJARO) 15 MG/0.5ML Pen Inject 15 mg into the skin once a week. 6 mL 3   TRESIBA FLEXTOUCH 200 UNIT/ML FlexTouch Pen Inject 40 Units into the skin daily. (Patient taking differently: Inject 50 Units into the skin daily.)     B-D ULTRAFINE III SHORT PEN 31G X 8 MM MISC Inject 1 each into the skin 3 (three) times daily.     Continuous Blood Gluc Sensor (FREESTYLE LIBRE 14 DAY SENSOR) MISC APPLY 1 SENSOR  EVERY 14 DAYS  11   PRESCRIPTION MEDICATION Pt uses CPAP Machine at bedtime      ALLERGIES:   Allergies  Allergen Reactions   Septra [Bactrim] Itching   Penicillins Rash    Allergy to All cillin drugs  Ancef given 9/24 with no obvious reaction   Metformin And Related Diarrhea and Nausea Only   Other Nausea And Vomiting    General Anesthesia - sometimes causes nausea and vomiting * OK to use scopolamine patch     Sulfamethoxazole-Trimethoprim Other (See Comments)   Wellbutrin [Bupropion]     Mood Changes   Doxycycline Hives and Rash    FAM HX: Family History  Adopted: Yes  Family history unknown: Yes    Social History:   reports that he quit smoking about 19 years ago. His smoking use included cigarettes. He started smoking about 61 years ago. He has a 42 pack-year smoking history. He has never used smokeless tobacco. He reports that he does not drink alcohol and does not use drugs.  ROS: 12 system ROS neg except per hPI  Blood pressure 123/62, pulse 71, temperature 97.6 F (36.4 C), resp. rate 17, height 6\' 1"  (1.854 m), weight (!) 140.3 kg, SpO2 96%. PHYSICAL EXAM: Gen: obese man who is comfortable in bed  Eyes: anicteric, glasses ENT: MMM Neck: thick CV: RRR Abd: soft, obese Lungs: clear GU: no foley Extr:  RLE cast, LLE no edema Neuro: conversant, nonfocal   Results for orders placed or performed during the hospital encounter of 01/02/23 (from the past 48 hour(s))  Sample to Blood Bank     Status: None   Collection Time: 01/02/23  6:22 AM  Result Value Ref Range   Blood Bank Specimen SAMPLE AVAILABLE FOR TESTING  Sample Expiration      01/05/2023,2359 Performed at [facility], [address], [place], [place] [address]   Type and screen [facility]     Status: None   Collection Time: 01/02/23  6:22 AM  Result Value Ref Range   ABO/RH(D) A POS    Antibody Screen NEG    Sample Expiration      01/05/2023,2359 Performed at [facility], [address] [place], [place] [address]   Protime-INR     Status: Abnormal   Collection Time: 01/02/23  6:24 AM  Result Value Ref Range   Prothrombin Time 16.1 (H) 11.4 - 15.2 seconds   INR 1.3 (H) 0.8 - 1.2    Comment: (NOTE) INR goal varies based on device and disease states. Performed at [facility], [address] [place], [place] [address]   CBC     Status: Abnormal   Collection Time: 01/02/23  6:24 AM  Result Value Ref Range   WBC 15.8 (H) 4.0 - 10.5 K/uL   RBC 3.89 (L) 4.22 - 5.81 MIL/uL   Hemoglobin 12.2 (L) 13.0 - 17.0 g/dL   HCT 32.4 (L) 40.1 - 02.7 %   MCV 89.5 80.0 - 100.0 fL   MCH 31.4 26.0 - 34.0 pg   MCHC 35.1 30.0 - 36.0 g/dL   RDW 25.3 66.4 - 40.3 %   Platelets 202 150 - 400 K/uL   nRBC 0.0 0.0 - 0.2 %    Comment: Performed at [facility], [address] [place], [place] [address]  Comprehensive metabolic panel     Status: Abnormal   Collection Time: 01/02/23  6:24 AM  Result Value Ref Range   Sodium 133 (L) 135 - 145 mmol/L   Potassium 3.7 3.5 - 5.1 mmol/L   Chloride 100 98 - 111 mmol/L   CO2 19 (L) 22 - 32 mmol/L   Glucose, Bld 300 (H) 70 - 99 mg/dL    Comment: Glucose reference range applies only to samples taken after fasting for at least 8 hours.   BUN 40 (H) 8 - 23 mg/dL   Creatinine, Ser 9.56 (H) 0.61 - 1.24 mg/dL   Calcium 7.9 (L) 8.9 - 10.3 mg/dL   Total Protein 4.9 (L) 6.5 - 8.1 g/dL   Albumin 2.6 (L) 3.5 - 5.0 g/dL   AST 20 15 - 41 U/L   ALT 14 0 - 44 U/L   Alkaline Phosphatase 71 38 - 126 U/L   Total Bilirubin 0.7 <1.2 mg/dL   GFR, Estimated 17 (L) >60 mL/min    Comment: (NOTE) Calculated using the CKD-EPI Creatinine Equation (2021)    Anion gap 14 5 - 15    Comment: Performed at [facility], [address], [place], [place] [address]  CBG monitoring, ED     Status: Abnormal   Collection Time: 01/02/23  8:27 AM  Result Value Ref Range   Glucose-Capillary 271 (H) 70 - 99 mg/dL    Comment: Glucose  reference range applies only to samples taken after fasting for at least 8 hours.  Surgical pcr screen     Status: Abnormal   Collection Time: 01/02/23  9:31 AM   Specimen: Nasal Mucosa; Nasal Swab  Result Value Ref Range   MRSA, PCR POSITIVE (A) NEGATIVE    Comment: RESULT CALLED TO, READ BACK BY AND VERIFIED WITH: [place] 114[address] @1210H  BY SM    Staphylococcus aureus  POSITIVE (A) NEGATIVE    Comment: (NOTE) The Xpert SA Assay (FDA approved for NASAL specimens in patients 69 years of age and older), is one component of a comprehensive surveillance program. It is not intended to diagnose infection nor to guide or monitor treatment. Performed at [facility], [address] [place], [place] [address]   Glucose, capillary     Status: Abnormal   Collection Time: 01/02/23  9:38 AM  Result Value Ref Range   Glucose-Capillary 274 (H) 70 - 99 mg/dL    Comment: Glucose reference range applies only to samples taken after fasting for at least 8 hours.  Glucose, capillary     Status: Abnormal   Collection Time: 01/02/23  2:44 PM  Result Value Ref Range   Glucose-Capillary 273 (H) 70 - 99 mg/dL    Comment: Glucose reference range applies only to samples taken after fasting for at least 8 hours.  Glucose, capillary     Status: Abnormal   Collection Time: 01/02/23  4:16 PM  Result Value Ref Range   Glucose-Capillary 272 (H) 70 - 99 mg/dL    Comment: Glucose reference range applies only to samples taken after fasting for at least 8 hours.  HIV Antibody (routine testing w rflx)     Status: None   Collection Time: 01/02/23  4:44 PM  Result Value Ref Range   HIV Screen 4th Generation wRfx Non Reactive Non Reactive    Comment: Performed at [facility], [address], [place], [place] [address]  TSH     Status: None   Collection Time: 01/02/23  4:44 PM  Result Value Ref Range   TSH 3.387 0.350 - 4.500 uIU/mL    Comment: Performed by a 3rd Generation assay with a functional  sensitivity of <=0.01 uIU/mL. Performed at [facility], [address], [place], [place] [address]   CK     Status: None   Collection Time: 01/02/23  4:44 PM  Result Value Ref Range   Total CK 251 49 - 397 U/L    Comment: Performed at [facility], [address], [place], [place] [address]  Hemoglobin A1c     Status: Abnormal   Collection Time: 01/02/23  4:45 PM  Result Value Ref Range   Hgb A1c MFr Bld 7.0 (H) 4.8 - 5.6 %    Comment: (NOTE) Pre diabetes:          5.7%-6.4%  Diabetes:              >6.4%  Glycemic control for   <7.0% adults with diabetes    Mean Plasma Glucose 154.2 mg/dL    Comment: Performed at [facility], [address], [place], [place] [address]  Glucose, capillary     Status: Abnormal   Collection Time: 01/02/23  8:40 PM  Result Value Ref Range   Glucose-Capillary 322 (H) 70 - 99 mg/dL    Comment: Glucose reference range applies only to samples taken after fasting for at least 8 hours.  CBC     Status: Abnormal   Collection Time: 01/03/23  6:45 AM  Result Value Ref Range   WBC 16.5 (H) 4.0 - 10.5 K/uL   RBC 3.07 (L) 4.22 - 5.81 MIL/uL   Hemoglobin 9.7 (L) 13.0 - 17.0 g/dL   HCT 88.4 (L) 16.6 - 06.3 %   MCV 89.9 80.0 - 100.0 fL   MCH 31.6 26.0 - 34.0 pg   MCHC  35.1 30.0 - 36.0 g/dL   RDW 21.3 08.6 - 57.8 %   Platelets 179 150 - 400 K/uL   nRBC 0.0 0.0 - 0.2 %    Comment: Performed at [facility], [address], [place], [place] [address]  Basic metabolic panel     Status: Abnormal   Collection Time: 01/03/23  6:45 AM  Result Value Ref Range   Sodium 134 (L) 135 - 145 mmol/L   Potassium 4.0 3.5 - 5.1 mmol/L   Chloride 99 98 - 111 mmol/L   CO2 20 (L) 22 - 32 mmol/L   Glucose, Bld 419 (H) 70 - 99 mg/dL    Comment: Glucose reference range applies only to samples taken after fasting for at least 8 hours.   BUN 51 (H) 8 - 23 mg/dL   Creatinine, Ser 9.52 (H) 0.61 - 1.24 mg/dL   Calcium 8.1 (L) 8.9 - 10.3 mg/dL   GFR,  Estimated 13 (L) >60 mL/min    Comment: (NOTE) Calculated using the CKD-EPI Creatinine Equation (2021)    Anion gap 15 5 - 15    Comment: Performed at [facility], [address] [place], [place] [address]  Glucose, capillary     Status: Abnormal   Collection Time: 01/03/23  7:44 AM  Result Value Ref Range   Glucose-Capillary 400 (H) 70 - 99 mg/dL    Comment: Glucose reference range applies only to samples taken after fasting for at least 8 hours.  Glucose, capillary     Status: Abnormal   Collection Time: 01/03/23 11:34 AM  Result Value Ref Range   Glucose-Capillary 456 (H) 70 - 99 mg/dL    Comment: Glucose reference range applies only to samples taken after fasting for at least 8 hours.   *Note: Due to a large number of results and/or encounters for the requested time period, some results have not been displayed. A complete set of results can be found in Results Review.    DG Ankle Complete Right  Result Date: 01/03/2023 CLINICAL DATA:  Fracture, postop. EXAM: RIGHT ANKLE - COMPLETE 3+ VIEW COMPARISON:  Preoperative imaging. FINDINGS: Lateral plate and screw fixation of distal fibular fracture. Two screws traverse the medial malleolar fracture. A single screw traverses the distal tibia. Two K-wires traverse the hindfoot. Fractures are in improved alignment from preoperative imaging. Overlying cast material which limits osseous and soft tissue fine detail. IMPRESSION: ORIF of distal tibia and fibula fractures and K wire fixation of the hindfoot. Electronically Signed   By: Narda Rutherford M.D.   On: 01/03/2023 11:22   DG Ankle Complete Right  Result Date: 01/02/2023 CLINICAL DATA:  Elective surgery. EXAM: RIGHT ANKLE - COMPLETE 3+ VIEW COMPARISON:  Preoperative imaging FINDINGS: Thirteen fluoroscopic spot views of the right ankle obtained in the operating room. Lateral plate and screw fixation of distal fibular fracture. Two screws traverse the medial malleolar fracture.  Additional screw traverses the distal tibia. Possible K-wire fixation of the hindfoot. Fluoroscopy time 1 minutes 29 seconds. Dose 2.02 mGy. IMPRESSION: Intraoperative fluoroscopy during ankle fracture fixation. Electronically Signed   By: Narda Rutherford M.D.   On: 01/02/2023 17:34   DG C-Arm 1-60 Min-No Report  Result Date: 01/02/2023 Fluoroscopy was utilized by the requesting physician.  No radiographic interpretation.   DG C-Arm 1-60 Min-No Report  Result Date: 01/02/2023 Fluoroscopy was utilized by the requesting physician.  No radiographic interpretation.   DG C-Arm 1-60 Min-No Report  Result Date: 01/02/2023 Fluoroscopy was  utilized by the requesting physician.  No radiographic interpretation.   CT ANKLE RIGHT WO CONTRAST  Result Date: 01/02/2023 CLINICAL DATA:  Right ankle injury after fall. EXAM: CT OF THE RIGHT ANKLE WITHOUT CONTRAST TECHNIQUE: Multidetector CT imaging of the right ankle was performed according to the standard protocol. Multiplanar CT image reconstructions were also generated. RADIATION DOSE REDUCTION: This exam was performed according to the departmental dose-optimization program which includes automated exposure control, adjustment of the mA and/or kV according to patient size and/or use of iterative reconstruction technique. COMPARISON:  Right ankle x-rays from same day. FINDINGS: Bones/Joint/Cartilage Acute oblique fracture through the base of the medial malleolus. The medial malleolar fragment is displaced up to 1.6 cm and internally rotated. Acute comminuted fracture of the distal fibular diaphysis with up to 7 mm posterior displacement of the dominant fragments. The butterfly fragment is posteriorly displaced approximately 1 cm. Acute longitudinal fracture of the posterior malleolus with 5 mm posterior displacement and 2 mm superior displacement. There are additional tiny avulsion fractures at the inferior tip of the medial malleolar fragment, and anterior  aspect of the lateral malleolus. Small minimally depressed fracture of the posterior lateral talar dome (series 11, image 27). Slight lateral subluxation of the talar dome with respect to the tibial plafond. No dislocation. Mild midfoot and hindfoot degenerative changes. Prominent calcaneal peroneal tubercle. Small tibiotalar joint effusion with several foci of intra-articular air. Ligaments Ligaments are suboptimally evaluated by CT. Muscles and Tendons Grossly intact. Soft tissue Diffuse soft tissue swelling with multiple foci of subcutaneous emphysema. No fluid collection or hematoma. No soft tissue mass. IMPRESSION: 1. Acute open trimalleolar fracture as described above. 2. Small minimally depressed fracture of the posterior lateral talar dome. Electronically Signed   By: Obie Dredge M.D.   On: 01/02/2023 10:36   DG Chest Portable 1 View  Result Date: 01/02/2023 CLINICAL DATA:  Preop planning. EXAM: PORTABLE CHEST 1 VIEW COMPARISON:  AP Lat chest 07/10/2022 FINDINGS: The heart is slightly enlarged. No vascular congestion is seen. The mediastinal configuration is normal. There is calcification of the transverse aorta. The lungs are clear. There is no substantial pleural effusion. Thoracic spondylosis with no new osseous findings. Compare: Stable chest. IMPRESSION: No evidence of acute chest disease. Slight cardiomegaly. Aortic atherosclerosis. Stable chest. Electronically Signed   By: Almira Bar M.D.   On: 01/02/2023 07:29   DG Ankle 2 Views Right  Result Date: 01/02/2023 CLINICAL DATA:  67 year old male history of open fracture of the right ankle status post reduction. EXAM: RIGHT ANKLE - 2 VIEW COMPARISON:  01/02/2023. FINDINGS: Compared to the prior examination there has been substantial improvement in alignment of the previously described fracture/dislocation of the right ankle. Comminuted displaced fracture of the distal third of the fibular diaphysis is again noted, now with predominantly  7 mm of posterior displacement of the distal fracture fragments and minimal residual angulation. The distal tibia is now associated with the talus, with some mild widening of the medial aspect of the tibiotalar joint. Osseous fragment adjacent to the medial malleolus remains displaced with some varus rotation. There also appears to be an osseous defect along the anterior aspect of the distal tibia, suggesting additional fracture. Interval placement of overlying splint material obscures finer bony detail. IMPRESSION: 1. Status post closed reduction splint fixation of previously noted fracture dislocation of the right ankle with improved alignment, as above. Electronically Signed   By: Trudie Reed M.D.   On: 01/02/2023 06:36   DG Ankle 2  Views Right  Result Date: 01/02/2023 CLINICAL DATA:  67 year old male with history of open fracture of the right ankle. EXAM: RIGHT ANKLE - 2 VIEW COMPARISON:  Right ankle radiograph 09/09/2019. FINDINGS: Two views of the right ankle demonstrates severe fracture/dislocation of the right ankle. Specifically, there is a displaced, comminuted and angulated fracture of the distal third of the fibular diaphysis, with proximally 30 degrees of lateral angulation. Distal fibular fracture remains associated with the talus. Complete dislocation of the tibia relative to the talus, with the distal tibia lying completely medial to the talus and slightly posterior, likely impacted upon the bone. There is an acute displaced fracture of the medial malleolus with proximally 1.2 cm of distal migration of the distal fracture fragment. Overlying soft tissues are swollen and grossly distorted. Finer bony detail is obscured by overlying drape materials. IMPRESSION: 1. Severe fracture dislocation of the right ankle joint, as detailed above. Electronically Signed   By: Trudie Reed M.D.   On: 01/02/2023 06:22    Assessment/Plan **s/p open ankle R ankle fracture: s/p operative repair 11/14.   PT/OT.  **AKI on CKD 3:  baseline Cr in the mid 2s, now with Cr up to 4.7 in the setting of syncope/hypotensive event.  No LUTs but will check renal US and UA for completeness.  No current indications for RRT but may develop in next 48-72h, discussed.  Strict I/Os. Avoid nephrotoxins, hypotension and hypovolemia.  Currently appearing euvolemic and po intake is ok - hold MIVF now, hold diuretics.  Daily labs.   **metabolic acidosis: in setting of AKI, mild, follow.   **ABLA: baseline normal, had bleeding prior to arrival then OR - Hb down to 9.7 today. Transfuse <7 per primary.  **HFpEF: appearing fairly euvolemic currently, low na diet.      **Gout: hold colchicine w his  AKI  *DM type 2: A1c 7, meds per primary.   **HTN: unclear that he has dx recent HTN - reports some syncopal episodes over past 6-90mo.  Avoid overtreating BP.   **A fib on eliquis and amio.   **PAD  WIll follow, please reach out with questions.  Tyler Pita 01/03/2023, 1:32 PM

## 2023-01-03 NOTE — Plan of Care (Signed)
  Problem: Coping: Goal: Ability to adjust to condition or change in health will improve Outcome: Progressing   Problem: Fluid Volume: Goal: Ability to maintain a balanced intake and output will improve Outcome: Progressing   Problem: Metabolic: Goal: Ability to maintain appropriate glucose levels will improve Outcome: Progressing   

## 2023-01-03 NOTE — Progress Notes (Signed)
CPAP set up w/last known sleep study results located in chart. Pt claims it feels familiar and comfortable. Pt set up w/med mask @this  time.   01/03/23 0028  BiPAP/CPAP/SIPAP  $ Non-Invasive Home Ventilator  Initial  $ Face Mask Medium Yes  BiPAP/CPAP/SIPAP Pt Type Adult  BiPAP/CPAP/SIPAP Resmed  Mask Type Full face mask  Mask Size Medium  EPAP 11 cmH2O

## 2023-01-03 NOTE — Progress Notes (Addendum)
Inpatient Rehab Admissions Coordinator:  Consult received. Pt does not appear to have the medical necessity to warrant an IPR admission. Also note, therapy is recommending SNF rehab. Insurance is not likely to grant approval. Other rehab venues should be pursued. TOC made aware.    Wolfgang Phoenix, MS, CCC-SLP Admissions Coordinator [phone]

## 2023-01-03 NOTE — Evaluation (Signed)
Occupational Therapy Evaluation Patient Details Name: Harold [name] MRN: [id number] DOB: 03-14-1955 Today's Date: 01/03/2023   History of Present Illness Pt is a 67 y/o male presenting on 11/14 after a fall. Found with open R ankle fx, s/p I&D, ORIF L ankle. Significant PMH: HTN, HLD, CAD s/p PCI, COPD, DM2, CKD stage IIIb, PVD, gout, OSA.   Clinical Impression   PTA patient reports independent with mobility, needing assist for LB ADLs and supervision for tub transfers.  Admitted for above and limited by problem list below.  He requires min guard for bed mobility, setup to total assist for ADLs.  Attempted standing from EOB but unable ascend with +2 assist and NWB to R LE, able to lateral scoot to [facility] with +2 assist. Pt has good support from spouse at home, but reports home is not accessible and pt has significant hx of falling.  Based on performance today, believe patient will best benefit from continued OT services acutely and after dc at inpatient setting with <3hrs/day to optimize independence, safety with ADLs and mobility.     If plan is discharge home, recommend the following: Two people to help with walking and/or transfers;A lot of help with bathing/dressing/bathroom;Supervision due to cognitive status;Assist for transportation;Direct supervision/assist for financial management;Direct supervision/assist for medications management;Assistance with cooking/housework    Functional Status Assessment  Patient has had a recent decline in their functional status and demonstrates the ability to make significant improvements in function in a reasonable and predictable amount of time.  Equipment Recommendations  Other (comment) (defer)    Recommendations for Other Services       Precautions / Restrictions Precautions Precautions: Fall Restrictions Weight Bearing Restrictions: Yes RLE Weight Bearing: Non weight bearing      Mobility Bed Mobility Overal bed mobility: Needs  Assistance Bed Mobility: Supine to Sit, Sit to Supine     Supine to sit: Contact guard Sit to supine: Contact guard assist   General bed mobility comments: Close guarding of RLE    Transfers Overall transfer level: Needs assistance                 General transfer comment: Pt unable to transition to standing with +2 assist while maintaining weightbearing precautions. Worked on lateral scooting edge of bed towards left with use of bed pad to guide hips with mod-max assist +2      Balance Overall balance assessment: Needs assistance Sitting-balance support: Feet unsupported Sitting balance-Leahy Scale: Fair Sitting balance - Comments: min guard to close supervision                                   ADL either performed or assessed with clinical judgement   ADL Overall ADL's : Needs assistance/impaired     Grooming: Set up;Sitting           Upper Body Dressing : Sitting;Contact guard assist   Lower Body Dressing: Sitting/lateral leans;Total assistance     Toilet Transfer Details (indicate cue type and reason): deferred           General ADL Comments: limited to EOB     Vision   Vision Assessment?: No apparent visual deficits     Perception         Praxis         Pertinent Vitals/Pain Pain Assessment Pain Assessment: Faces Faces Pain Scale: Hurts a little bit Pain Location: R ankle Pain Descriptors /  Indicators: Discomfort, Operative site guarding Pain Intervention(s): Limited activity within patient's tolerance, Monitored during session, Repositioned     Extremity/Trunk Assessment Upper Extremity Assessment Upper Extremity Assessment: Generalized weakness;Right hand dominant   Lower Extremity Assessment Lower Extremity Assessment: Defer to PT evaluation   Cervical / Trunk Assessment Cervical / Trunk Assessment: Other exceptions Cervical / Trunk Exceptions: increased body habitus   Communication  Communication Communication: No apparent difficulties   Cognition Arousal: Alert Behavior During Therapy: WFL for tasks assessed/performed Overall Cognitive Status: Within Functional Limits for tasks assessed                                       General Comments  patient with several episodes of dizziness sitting EOB but VSS    Exercises     Shoulder Instructions      Home Living Family/patient expects to be discharged to:: Private residence Living Arrangements: Spouse/significant other Available Help at Discharge: Family Type of Home: House Home Access: Stairs to enter Secretary/administrator of Steps: 3 Entrance Stairs-Rails: None Home Layout: One level     Bathroom Shower/Tub: Chief Strategy Officer: Handicapped height     Home Equipment: Shower seat          Prior Functioning/Environment Prior Level of Function : History of Falls (last six months);Needs assist (8 falls in the last 6 months)             Mobility Comments: no AD, hx 20-30 falls ADLs Comments: reports needing assist for LB ADLs from significant other, supervision for bathing and assist for tub transfers        OT Problem List: Decreased strength;Decreased activity tolerance;Impaired balance (sitting and/or standing);Decreased knowledge of use of DME or AE;Decreased knowledge of precautions;Pain;Obesity      OT Treatment/Interventions: Self-care/ADL training;Therapeutic exercise;DME and/or AE instruction;Therapeutic activities;Balance training;Patient/family education    OT Goals(Current goals can be found in the care plan section) Acute Rehab OT Goals Patient Stated Goal: get better OT Goal Formulation: With patient Time For Goal Achievement: 01/17/23 Potential to Achieve Goals: Fair  OT Frequency: Min 1X/week    Co-evaluation              AM-PAC OT "6 Clicks" Daily Activity     Outcome Measure Help from another person eating meals?: None Help from  another person taking care of personal grooming?: A Little Help from another person toileting, which includes using toliet, bedpan, or urinal?: A Lot Help from another person bathing (including washing, rinsing, drying)?: A Lot Help from another person to put on and taking off regular upper body clothing?: A Little Help from another person to put on and taking off regular lower body clothing?: Total 6 Click Score: 15   End of Session Equipment Utilized During Treatment: Gait belt Nurse Communication: Mobility status  Activity Tolerance: Patient tolerated treatment well Patient left: in bed;with call bell/phone within reach;with bed alarm set  OT Visit Diagnosis: Other abnormalities of gait and mobility (R26.89);History of falling (Z91.81);Pain;Muscle weakness (generalized) (M62.81) Pain - Right/Left: Right Pain - part of body: Ankle and joints of foot                Time: 0829-0906 OT Time Calculation (min): 37 min Charges:  OT General Charges $OT Visit: 1 Visit OT Evaluation $OT Eval Moderate Complexity: 1 Mod  Barry Brunner, OT Acute Rehabilitation [employer] [phone]   [place]  S Kadesha Virrueta 01/03/2023, 1:04 PM

## 2023-01-03 NOTE — Anesthesia Postprocedure Evaluation (Signed)
Anesthesia Post Note  Patient: Harold [name]  Procedure(s) Performed: IRRIGATION AND DEBRIDEMENT RIGHT ANKLE (Right) OPEN REDUCTION INTERNAL FIXATION (ORIF)RIGHT ANKLE FRACTURE (Right: Ankle)     Patient location during evaluation: PACU Anesthesia Type: General Level of consciousness: awake and alert Pain management: pain level controlled Vital Signs Assessment: post-procedure vital signs reviewed and stable Respiratory status: spontaneous breathing, nonlabored ventilation, respiratory function stable and patient connected to nasal cannula oxygen Cardiovascular status: blood pressure returned to baseline and stable Postop Assessment: no apparent nausea or vomiting Anesthetic complications: no   No notable events documented.  Last Vitals:    Last Pain:                 Collene Schlichter

## 2023-01-03 NOTE — Progress Notes (Addendum)
PROGRESS NOTE   Harold [name]  ZOX:[id number]    DOB: 1955-02-28    DOA: 01/02/2023  PCP: [name], MD   I have briefly reviewed patients previous medical records in [facility].  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  Admitted for open right occult fracture sustained after a mechanical fall, suspected due to orthostatic hypotension, acute on chronic kidney disease.  Orthopedics consulted and underwent I&D and ORIF of right ankle fracture 11/14.  Nephrology consulted for progressive AKI.  Eventual SNF pending clinical improvement and stability.   Assessment & Plan:  Principal Problem:   Open ankle fracture Active Problems:   Fall at home, initial encounter   Leukocytosis   Acute kidney injury superimposed on chronic kidney disease (HCC)   Normocytic anemia   Paroxysmal atrial fibrillation (HCC)   Uncontrolled type 2 diabetes mellitus with hyperglycemia, with long-term current use of insulin (HCC)   Prolonged QT interval   Chronic heart failure with preserved ejection fraction (HFpEF) (HCC)   Essential hypertension   Hypothyroidism   CAD, LAD/CFX DES 04/28/12- staged RCA DES 04/29/12 after pt declined CABG, cath 05/14/13 stable CAD medical therapy   PVD (peripheral vascular disease) (HCC)   COPD (chronic obstructive pulmonary disease) (HCC)   Anxiety and depression   OSA on CPAP   Obesity, Class III, BMI 40-49.9 (morbid obesity)  (HCC)   Right open ankle fracture: Sustained post mechanical fall after symptoms suggestive of orthostatic hypotension Orthopedics consulted and underwent I&D and ORIF of right ankle fracture 11/14.   Orthopedics follow-up appreciated: NWB RLE x 8 weeks, mobilized with walker, will remain in splint x 2 weeks then will be converted to a short leg cast.  Checking vitamin D level.   Therapies recommend SNF.  As per CIR coordinator note, does not appear to have necessity to warrant CIR admission S/p tetanus shot. Continue IV ceftriaxone.  Acute kidney injury complicating stage IIIb CKD: Follows with Dr. Marisue Humble, CK Baseline creatinine 1.8-2 Presented with creatinine of 3.73 and BUN of 40. Initially felt to be due to some volume depletion from overdiuresis. Despite a day of IV fluid hydration, creatinine has worsened from 3.7-4.7 There is history of patient taking ibuprofen following treatment for dental abscess and was also hypotensive with EMS with SBP's down to 70s after receiving IV fentanyl.  ATN on the differentials. Clinically appears somewhat volume overloaded. Avoid nephrotoxics.  Holding Lasix and losartan.  Trend daily BMP.  Strict intake output. Nephrology consulted. Will get urine analysis, urine electrolytes and renal ultrasound.  Acute blood loss anemia: Baseline hemoglobin probably in the 13-14 range Presented with hemoglobin of 12.2 which dropped to 9.7 on postop day 1 EBL 200 mL Follow CBC daily and transfuse for hemoglobin 7 g or less.  Leukocytosis: Possibly stress response but also had an open fracture Continue IV ceftriaxone and trend daily CBCs.  Type II DM/IDDM in obese with peripheral neuropathy, hyperglycemia Reports that he does not follow with endocrinology Diabetic  using insulin since 1990s A1c of 7 indicates reasonable outpatient control Uses high doses of insulins including Tresiba 50 units daily, U-500, 50 units daily and 150 units at  bedtime. Initiated Evaristo Bury equivalent Semglee 50 nights daily, U-500, 50 units twice daily, NovoLog resistant SSI.  Plan to escalate insulins quickly based on CBG readings.  Included bedtime SSI. Diabetes coordinator consulted for assistance.  Essential hypertension: Transient hypotension with EMS.  Soft blood pressures here.  No orthostatics by therapies from supine to sitting but unable to do standing Continue carvedilol Holding losartan and furosemide  Hyperlipidemia Continue rosuvastatin  Hypothyroid TSH: 3.387 Continue home dose of Synthroid  OSA on nightly CPAP/COPD Continue CPAP No clinical bronchospasm  Paroxysmal atrial fibrillation/secondary hypercoagulable state In sinus rhythm on telemetry.  Continue amiodarone After orthopedics clearance, pharmacy has resumed Eliquis on postop day 1. Follows with Dr. Herbie Baltimore.  Also follows with [name] for PAD.  CAD/PAD/lower extremity venous stasis ulcers S/p CEA, cardiac and peripheral artery stents. Recently taken off Plavix and started on Eliquis.  Continue statins. Follows with outpatient wound care.  Chronic diastolic CHF TTE 05/979: LVEF 19-14% Diuretics on hold due to AKI May be slightly volume overloaded/left leg edema but no respiratory symptoms.  Prolonged QTc: EKG on admission 11/15 showed QTc of 562 ms.?  Accuracy based on BBB morphology. Repeat EKG.  Continue telemetry Potassium is 4.  Check magnesium and ensure >2 Avoid QT prolonging medications  Anxiety and depression Continue home Abilify and Cymbalta  Ambulatory dysfunction/frequent falls/?  Orthostatic hypotension This was reported by patient and his spouse. Unable to do full orthostatics today Used to use lower extremity compression stockings but those caused him to have leg ulcers Counseled extensively regarding measures to minimize orthostatic symptoms. They claim that providers have made multiple medication adjustment for same but did not see any  mention regarding orthostatic hypotension on recent cardiology office visit note from 10/29.   Body mass index is 40.81 kg/m./Morbid obesity Patient on Mounjaro as outpatient in efforts to lose weight.  Nutritional Status Nutrition Problem: Altered nutrition lab value Etiology: chronic illness, other (see comment) (meal pattern at home likely not beneficial for glycemic control) Signs/Symptoms: other (comment) (BG>400) Interventions: MVI, Snacks  Pressure Ulcer:    DVT prophylaxis: SCDs Start: 01/02/23 0811.  Eliquis   Code Status: Full Code:  Family Communication: Patient's spouse (male) on patient's speaker phone at bedside.  Therapies were also in the room.  Updated care and answered all questions. Disposition:  Status is: Inpatient Remains inpatient appropriate because: Immediate postop, acute blood loss anemia that needs monitoring, acute kidney injury-pending nephrology consultation, further evaluation and management.     Consultants:   Orthopedics Nephrology  Procedures:   As noted above  Antimicrobials:   IV ceftriaxone   Subjective:  Seen this morning.  Therapies at bedside and patient's male spouse on his speaker phone.  Per therapies, patient was somewhat dizzy, diaphoretic and nauseous on sitting up from lying.  Unable to stand.  No significant drop in blood pressure.  Says that he feels better.  Not much pain in the operated right lower extremity (nerve block still active).  Denies chest pain or dyspnea.  He reports 7 to 8 months history of ongoing dizziness and ambulatory position and up to "30 falls" in that timeframe.  Objective:   Vitals:   01/02/23 2332 01/03/23 0417 01/03/23 0500 01/03/23 0801  BP: (!) 111/56 (!) 141/66  123/62  Pulse: 73 81  71  Resp: 18  18  17  Temp: 98 F (36.7 C) 98.6 F (37 C)  97.6 F (36.4 C)  TempSrc: Oral Oral    SpO2: 93% 94%  96%  Weight:   (!) 140.3 kg   Height:        General exam: Middle-age male, moderately  built and morbidly obese sitting up comfortably at edge of bed without distress. Respiratory system: Clear to auscultation. Respiratory effort normal. Cardiovascular system: S1 & S2 heard, RRR. No JVD, murmurs, rubs, gallops or clicks.  Left leg trace edema.  Telemetry personally reviewed: SR with BBB morphology. Gastrointestinal system: Abdomen is nondistended, soft and nontender. No organomegaly or masses felt. Normal bowel sounds heard. Central nervous system: Alert and oriented. No focal neurological deficits. Extremities: Symmetric 5 x 5 power.  Right leg in cast.  Left dorsal foot with healing ulcer with scabbing and no acute findings. Skin: No rashes, lesions or ulcers Psychiatry: Judgement and insight appear normal. Mood & affect appropriate.     Data Reviewed:   I have personally reviewed following labs and imaging studies   CBC: Recent Labs  Lab 01/02/23 0624 01/03/23 0645  WBC 15.8* 16.5*  HGB 12.2* 9.7*  HCT 34.8* 27.6*  MCV 89.5 89.9  PLT 202 179    Basic Metabolic Panel: Recent Labs  Lab 01/02/23 0624 01/03/23 0645  NA 133* 134*  K 3.7 4.0  CL 100 99  CO2 19* 20*  GLUCOSE 300* 419*  BUN 40* 51*  CREATININE 3.73* 4.69*  CALCIUM 7.9* 8.1*    Liver Function Tests: Recent Labs  Lab 01/02/23 0624  AST 20  ALT 14  ALKPHOS 71  BILITOT 0.7  PROT 4.9*  ALBUMIN 2.6*    CBG: Recent Labs  Lab 01/02/23 2040 01/03/23 0744 01/03/23 1134  GLUCAP 322* 400* 456*    Microbiology Studies:   Recent Results (from the past 240 hour(s))  Surgical pcr screen     Status: Abnormal   Collection Time: 01/02/23  9:31 AM   Specimen: Nasal Mucosa; Nasal Swab  Result Value Ref Range Status   MRSA, PCR POSITIVE (A) NEGATIVE Final    Comment: RESULT CALLED TO, READ BACK BY AND VERIFIED WITH: [place] ON 11424 @1210H  BY SM    Staphylococcus aureus POSITIVE (A) NEGATIVE Final    Comment: (NOTE) The Xpert SA Assay (FDA approved for NASAL specimens in patients  76 years of age and older), is one component of a comprehensive surveillance program. It is not intended to diagnose infection nor to guide or monitor treatment. Performed at [facility], [address], [place], [place] [address]     Radiology Studies:  DG Ankle Complete Right  Result Date: 01/03/2023 CLINICAL DATA:  Fracture, postop. EXAM: RIGHT ANKLE - COMPLETE 3+ VIEW COMPARISON:  Preoperative imaging. FINDINGS: Lateral plate and screw fixation of distal fibular fracture. Two screws traverse the medial malleolar fracture. A single screw traverses the distal tibia. Two K-wires traverse the hindfoot. Fractures are in improved alignment from preoperative imaging. Overlying cast material which limits osseous and soft tissue fine detail. IMPRESSION: ORIF of distal tibia and fibula fractures and K wire fixation of the hindfoot. Electronically Signed   By: Narda Rutherford M.D.   On: 01/03/2023 11:22   DG Ankle Complete Right  Result Date: 01/02/2023 CLINICAL DATA:  Elective surgery. EXAM: RIGHT ANKLE - COMPLETE 3+ VIEW COMPARISON:  Preoperative imaging FINDINGS: Thirteen fluoroscopic spot views of the right ankle obtained in the operating room. Lateral plate  and screw fixation of distal fibular fracture. Two screws traverse the medial malleolar fracture. Additional screw traverses the distal tibia. Possible K-wire fixation of the hindfoot. Fluoroscopy time 1 minutes 29 seconds. Dose 2.02 mGy. IMPRESSION: Intraoperative fluoroscopy during ankle fracture fixation. Electronically Signed   By: Narda Rutherford M.D.   On: 01/02/2023 17:34   DG C-Arm 1-60 Min-No Report  Result Date: 01/02/2023 Fluoroscopy was utilized by the requesting physician.  No radiographic interpretation.   DG C-Arm 1-60 Min-No Report  Result Date: 01/02/2023 Fluoroscopy was utilized by the requesting physician.  No radiographic interpretation.   DG C-Arm 1-60 Min-No Report  Result Date: 01/02/2023 Fluoroscopy  was utilized by the requesting physician.  No radiographic interpretation.   CT ANKLE RIGHT WO CONTRAST  Result Date: 01/02/2023 CLINICAL DATA:  Right ankle injury after fall. EXAM: CT OF THE RIGHT ANKLE WITHOUT CONTRAST TECHNIQUE: Multidetector CT imaging of the right ankle was performed according to the standard protocol. Multiplanar CT image reconstructions were also generated. RADIATION DOSE REDUCTION: This exam was performed according to the departmental dose-optimization program which includes automated exposure control, adjustment of the mA and/or kV according to patient size and/or use of iterative reconstruction technique. COMPARISON:  Right ankle x-rays from same day. FINDINGS: Bones/Joint/Cartilage Acute oblique fracture through the base of the medial malleolus. The medial malleolar fragment is displaced up to 1.6 cm and internally rotated. Acute comminuted fracture of the distal fibular diaphysis with up to 7 mm posterior displacement of the dominant fragments. The butterfly fragment is posteriorly displaced approximately 1 cm. Acute longitudinal fracture of the posterior malleolus with 5 mm posterior displacement and 2 mm superior displacement. There are additional tiny avulsion fractures at the inferior tip of the medial malleolar fragment, and anterior aspect of the lateral malleolus. Small minimally depressed fracture of the posterior lateral talar dome (series 11, image 27). Slight lateral subluxation of the talar dome with respect to the tibial plafond. No dislocation. Mild midfoot and hindfoot degenerative changes. Prominent calcaneal peroneal tubercle. Small tibiotalar joint effusion with several foci of intra-articular air. Ligaments Ligaments are suboptimally evaluated by CT. Muscles and Tendons Grossly intact. Soft tissue Diffuse soft tissue swelling with multiple foci of subcutaneous emphysema. No fluid collection or hematoma. No soft tissue mass. IMPRESSION: 1. Acute open trimalleolar  fracture as described above. 2. Small minimally depressed fracture of the posterior lateral talar dome. Electronically Signed   By: Obie Dredge M.D.   On: 01/02/2023 10:36   DG Chest Portable 1 View  Result Date: 01/02/2023 CLINICAL DATA:  Preop planning. EXAM: PORTABLE CHEST 1 VIEW COMPARISON:  AP Lat chest 07/10/2022 FINDINGS: The heart is slightly enlarged. No vascular congestion is seen. The mediastinal configuration is normal. There is calcification of the transverse aorta. The lungs are clear. There is no substantial pleural effusion. Thoracic spondylosis with no new osseous findings. Compare: Stable chest. IMPRESSION: No evidence of acute chest disease. Slight cardiomegaly. Aortic atherosclerosis. Stable chest. Electronically Signed   By: Almira Bar M.D.   On: 01/02/2023 07:29   DG Ankle 2 Views Right  Result Date: 01/02/2023 CLINICAL DATA:  67 year old male history of open fracture of the right ankle status post reduction. EXAM: RIGHT ANKLE - 2 VIEW COMPARISON:  01/02/2023. FINDINGS: Compared to the prior examination there has been substantial improvement in alignment of the previously described fracture/dislocation of the right ankle. Comminuted displaced fracture of the distal third of the fibular diaphysis is again noted, now with predominantly 7 mm of posterior displacement  of the distal fracture fragments and minimal residual angulation. The distal tibia is now associated with the talus, with some mild widening of the medial aspect of the tibiotalar joint. Osseous fragment adjacent to the medial malleolus remains displaced with some varus rotation. There also appears to be an osseous defect along the anterior aspect of the distal tibia, suggesting additional fracture. Interval placement of overlying splint material obscures finer bony detail. IMPRESSION: 1. Status post closed reduction splint fixation of previously noted fracture dislocation of the right ankle with improved alignment,  as above. Electronically Signed   By: Trudie Reed M.D.   On: 01/02/2023 06:36   DG Ankle 2 Views Right  Result Date: 01/02/2023 CLINICAL DATA:  67 year old male with history of open fracture of the right ankle. EXAM: RIGHT ANKLE - 2 VIEW COMPARISON:  Right ankle radiograph 09/09/2019. FINDINGS: Two views of the right ankle demonstrates severe fracture/dislocation of the right ankle. Specifically, there is a displaced, comminuted and angulated fracture of the distal third of the fibular diaphysis, with proximally 30 degrees of lateral angulation. Distal fibular fracture remains associated with the talus. Complete dislocation of the tibia relative to the talus, with the distal tibia lying completely medial to the talus and slightly posterior, likely impacted upon the bone. There is an acute displaced fracture of the medial malleolus with proximally 1.2 cm of distal migration of the distal fracture fragment. Overlying soft tissues are swollen and grossly distorted. Finer bony detail is obscured by overlying drape materials. IMPRESSION: 1. Severe fracture dislocation of the right ankle joint, as detailed above. Electronically Signed   By: Trudie Reed M.D.   On: 01/02/2023 06:22    Scheduled Meds:    allopurinol  100 mg Oral Daily   amiodarone  200 mg Oral Daily   apixaban  5 mg Oral BID   ARIPiprazole  5 mg Oral Daily   carvedilol  3.125 mg Oral BID WC   colchicine  0.6 mg Oral Daily   DULoxetine  60 mg Oral Daily   ezetimibe  10 mg Oral Daily   gabapentin  300 mg Oral BID   [START ON 01/04/2023] influenza vaccine adjuvanted  0.5 mL Intramuscular Tomorrow-1000   insulin aspart  0-20 Units Subcutaneous TID WC   insulin glargine-yfgn  50 Units Subcutaneous Daily   insulin regular human CONCENTRATED  50 Units Subcutaneous BID WC   levothyroxine  88 mcg Oral QAC breakfast   multivitamin with minerals  1 tablet Oral Daily   pantoprazole  40 mg Oral Daily   rosuvastatin  10 mg Oral QPM    sodium chloride flush  3 mL Intravenous Q12H    Continuous Infusions:    cefTRIAXone (ROCEPHIN)  IV 2 g (01/02/23 1717)     LOS: 1 day     Marcellus Scott, MD,  FACP, [facility], [facility], [facility]   [facility] Ad[facility] [place]      To contact the attending provider between 7A-7P or the covering provider during after hours 7P-7A, please log into the web site [email or web address] and access using universal [place] password for that web site. If you do not have the password, please call the hospital operator.  01/03/2023, 1:15 PM

## 2023-01-03 NOTE — Inpatient Diabetes Management (Addendum)
Inpatient Diabetes Program Recommendations  AACE/ADA: New Consensus Statement on Inpatient Glycemic Control (2015)  Target Ranges:  Prepandial:   less than 140 mg/dL      Peak postprandial:   less than 180 mg/dL (1-2 hours)      Critically ill patients:  140 - 180 mg/dL   Lab Results  Component Value Date   GLUCAP 456 (H) 01/03/2023   HGBA1C 7.0 (H) 01/02/2023    Review of Glycemic Control  Diabetes history: type 2 Outpatient Diabetes medications: U-500 insulin regular 50 units in am, 150 units at supper, Tresiba 50 units daily, Mounjaro 15 mg weekly, Freestyle Libre 3 CGM Current orders for Inpatient glycemic control: Semglee 50 units daily, U-500 insulin 50 units BID, Novolog 0-20 units correction scale TID  Inpatient Diabetes Program Recommendations:   Spoke with patient at the bedside. Was diagnosed with diabetes in 1997. He sees Dr. [name] as his PCP. He confirmed that he takes [place] 50 units daily, U-500 insulin 50 units every am, 150 units every pm, and Mounjaro 15 mg weekly. His HgbA1C is 7% on 01/02/23. He states that it has been higher, so he was pleased to know that it was down. States that he is very insulin resistant.   He states that he does have neuropathy in both feet and states that he did not feel pain after the fracture in his ankle. Patient has a [employer] 3 continuous glucose monitor on left arm and has been checking with his own phone. He knows that staff has to check blood sugars with meter in the hospital. Discussed the importance of good blood sugar control while his ankle is healing.   Noted that patient had Decadron 20 mg total yesterday during surgery and U-500 insulin was not started until today. He did not receive Semglee 50 units yesterday per MAR. Semglee was held due to surgery. Will continue to follow blood sugars while in the hospital.  Smith Mince RN BSN CDE Diabetes Coordinator Pager: [phone]  8am-5pm

## 2023-01-04 DIAGNOSIS — N189 Chronic kidney disease, unspecified: Secondary | ICD-10-CM | POA: Diagnosis not present

## 2023-01-04 DIAGNOSIS — E876 Hypokalemia: Secondary | ICD-10-CM | POA: Diagnosis not present

## 2023-01-04 DIAGNOSIS — E559 Vitamin D deficiency, unspecified: Secondary | ICD-10-CM

## 2023-01-04 DIAGNOSIS — N179 Acute kidney failure, unspecified: Secondary | ICD-10-CM | POA: Diagnosis not present

## 2023-01-04 LAB — CK: Total CK: 319 U/L (ref 49–397)

## 2023-01-04 LAB — RENAL FUNCTION PANEL
Albumin: 2.8 g/dL — ABNORMAL LOW (ref 3.5–5.0)
Anion gap: 11 (ref 5–15)
BUN: 62 mg/dL — ABNORMAL HIGH (ref 8–23)
CO2: 26 mmol/L (ref 22–32)
Calcium: 8.4 mg/dL — ABNORMAL LOW (ref 8.9–10.3)
Chloride: 101 mmol/L (ref 98–111)
Creatinine, Ser: 4.49 mg/dL — ABNORMAL HIGH (ref 0.61–1.24)
GFR, Estimated: 13 mL/min — ABNORMAL LOW (ref >60)
Glucose, Bld: 138 mg/dL — ABNORMAL HIGH (ref 70–99)
Phosphorus: 4.6 mg/dL (ref 2.5–4.6)
Potassium: 3.2 mmol/L — ABNORMAL LOW (ref 3.5–5.1)
Sodium: 138 mmol/L (ref 135–145)

## 2023-01-04 LAB — CBC
HCT: 26.6 % — ABNORMAL LOW (ref 39.0–52.0)
Hemoglobin: 9.2 g/dL — ABNORMAL LOW (ref 13.0–17.0)
MCH: 31 pg (ref 26.0–34.0)
MCHC: 34.6 g/dL (ref 30.0–36.0)
MCV: 89.6 fL (ref 80.0–100.0)
Platelets: 187 10*3/uL (ref 150–400)
RBC: 2.97 MIL/uL — ABNORMAL LOW (ref 4.22–5.81)
RDW: 14.2 % (ref 11.5–15.5)
WBC: 13.7 10*3/uL — ABNORMAL HIGH (ref 4.0–10.5)
nRBC: 0 % (ref 0.0–0.2)

## 2023-01-04 LAB — GLUCOSE, CAPILLARY
Glucose-Capillary: 143 mg/dL — ABNORMAL HIGH (ref 70–99)
Glucose-Capillary: 182 mg/dL — ABNORMAL HIGH (ref 70–99)
Glucose-Capillary: 145 mg/dL — ABNORMAL HIGH (ref 70–99)
Glucose-Capillary: 101 mg/dL — ABNORMAL HIGH (ref 70–99)

## 2023-01-04 LAB — VITAMIN D 25 HYDROXY (VIT D DEFICIENCY, FRACTURES): Vit D, 25-Hydroxy: 15.05 ng/mL — ABNORMAL LOW (ref 30–100)

## 2023-01-04 MED ORDER — POTASSIUM CHLORIDE CRYS ER 20 MEQ PO TBCR
40.0000 meq | EXTENDED_RELEASE_TABLET | Freq: Once | ORAL | Status: AC
  Administered 2023-01-04: 40 meq via ORAL
  Filled 2023-01-04: qty 2

## 2023-01-04 NOTE — Progress Notes (Signed)
2 Days Post-Op Procedure(s) (LRB): IRRIGATION AND DEBRIDEMENT RIGHT ANKLE (Right) OPEN REDUCTION INTERNAL FIXATION (ORIF)RIGHT ANKLE FRACTURE (Right) Subjective:  Patient reports pain as mild.  Slept okay.  Splint in place.  Chronic neuropathy of feet at baseline.  Denies chest pain, ShOB, N/V.  Denies numbness or tingling.  No other complaints at this time.  Objective:   VITALS:   Vitals:   01/03/23 1548 01/03/23 2106 01/04/23 0432 01/04/23 0730  BP: 112/70 108/60 (!) 159/68 (!) 128/57  Pulse: 67 65 68 65  Resp:  20 20 18   Temp:  (!) 97.5 F (36.4 C) (!) 97.3 F (36.3 C) 98.2 F (36.8 C)  TempSrc:   Oral Oral  SpO2: 95% 97% 99% 99%  Weight:      Height:        AAOx4 sitting comfortably RLE: Extremity warm, decent perfusion Dorsiflexion/Plantar flexion intact Splint fits okay, well padded, clean Compartment soft Wiggles toes appropriately No pain out of proportion with passive stretching of toes/ankle  Lab Results  Component Value Date   WBC 13.7 (H) 01/04/2023   HGB 9.2 (L) 01/04/2023   HCT 26.6 (L) 01/04/2023   MCV 89.6 01/04/2023   PLT 187 01/04/2023   BMET    Component Value Date/Time   NA 138 01/04/2023 0528   NA 141 11/14/2022 1624   K 3.2 (L) 01/04/2023 0528   CL 101 01/04/2023 0528   CO2 26 01/04/2023 0528   GLUCOSE 138 (H) 01/04/2023 0528   BUN 62 (H) 01/04/2023 0528   BUN 23 11/14/2022 1624   CREATININE 4.49 (H) 01/04/2023 0528   CREATININE 1.51 (H) 10/16/2015 1053   CALCIUM 8.4 (L) 01/04/2023 0528   EGFR 32 (L) 11/14/2022 1624   GFRNONAA 13 (L) 01/04/2023 0528   GFRNONAA 49 (L) 10/16/2015 1053     Xray: stable post-operative imaging  Assessment/Plan: 2 Days Post-Op   Principal Problem:   Open ankle fracture Active Problems:   Essential hypertension   Anxiety and depression   CAD, LAD/CFX DES 04/28/12- staged RCA DES 04/29/12 after pt declined CABG, cath 05/14/13 stable CAD medical therapy   OSA on CPAP   Obesity, Class III,  BMI 40-49.9 (morbid obesity) (HCC)   COPD (chronic obstructive pulmonary disease) (HCC)   Chronic heart failure with preserved ejection fraction (HFpEF) (HCC)   PVD (peripheral vascular disease) (HCC)   Prolonged QT interval   Fall at home, initial encounter   Leukocytosis   Acute kidney injury superimposed on chronic kidney disease (HCC)   Normocytic anemia   Paroxysmal atrial fibrillation (HCC)   Uncontrolled type 2 diabetes mellitus with hyperglycemia, with long-term current use of insulin (HCC)   Hypothyroidism   Procedure(s) (LRB): IRRIGATION AND DEBRIDEMENT RIGHT ANKLE (Right) OPEN REDUCTION INTERNAL FIXATION (ORIF)RIGHT ANKLE FRACTURE (Right)   Narrative: 01/04/23: Hgb 9.2, generally stable.  Pain okay, bandage dry and intact.  Able to wiggle toes well.  Reports some chronic BLE neuropathy.  Antibiotics per ID.  Continue mobilization with PT.  Anticipate DC to SNF vs CIR.   Plan:  Weightbearing: NWB RLE x 8 wks ROM/Precautions:  No ankle motion, move knee as tolerated Mobility: Continue with PT/OT Insicional and dressing care: Daily PRN Pain management: multimodal  Antibiotics: Periop abx + Rocephin for open fracture x 72 hrs Diet: Advance as tolerated VTE prophylaxis: Resume home eliquis Dispo: SNF vs CIR Follow - up plan: in office in 2 weeks for wound check and transition from splint to [facility]  Cecil Cobbs 01/04/2023, 11:47 AM   Contact information:   [id number] 7am-5pm epic message Dr. Blanchie Dessert, or call office for patient follow up: [phone] After hours and holidays please check [email or web address] for group call information for [facility]

## 2023-01-04 NOTE — Progress Notes (Addendum)
PROGRESS NOTE   Harold [name]  XBJ:[id number]    DOB: May 17, 1955    DOA: 01/02/2023  PCP: [name], MD   I have briefly reviewed patients previous medical records in [facility].  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  Admitted for open right occult fracture sustained after a mechanical fall, suspected due to orthostatic hypotension, acute on chronic kidney disease.  Orthopedics consulted and underwent I&D and ORIF of right ankle fracture 11/14.  Nephrology consulted for progressive AKI.  Eventual SNF pending clinical improvement and stability.   Assessment & Plan:  Principal Problem:   Open ankle fracture Active Problems:   Fall at home, initial encounter   Leukocytosis   Acute kidney injury superimposed on chronic kidney disease (HCC)   Normocytic anemia   Paroxysmal atrial fibrillation (HCC)   Uncontrolled type 2 diabetes mellitus with hyperglycemia, with long-term current use of insulin (HCC)   Prolonged QT interval   Chronic heart failure with preserved ejection fraction (HFpEF) (HCC)   Essential hypertension   Hypothyroidism   CAD, LAD/CFX DES 04/28/12- staged RCA DES 04/29/12 after pt declined CABG, cath 05/14/13 stable CAD medical therapy   PVD (peripheral vascular disease) (HCC)   COPD (chronic obstructive pulmonary disease) (HCC)   Anxiety and depression   OSA on CPAP   Obesity, Class III, BMI 40-49.9 (morbid obesity)  (HCC)   Right open ankle fracture: Sustained post mechanical fall after symptoms suggestive of orthostatic hypotension Orthopedics consulted and underwent I&D and ORIF of right ankle fracture 11/14.   Orthopedics follow-up appreciated: NWB RLE x 8 weeks, no ankle motion, move knee as tolerated, mobilize with walker, will remain in splint x 2 weeks then will be converted to a short leg cast.  Remains on Eliquis for DVT prophylaxis.  Vitamin D levels 15.05, replace and follow. Therapies recommend SNF.  As per CIR coordinator note, does not appear to have necessity to warrant CIR admission S/p tetanus shot. Continue IV ceftriaxone x 72 hours per orthopedics.  Acute kidney injury complicating stage IIIb CKD: Follows with Dr. Marisue Humble, [facility] Baseline creatinine 1.8-2 Presented with creatinine of 3.73 and BUN of 40. Initially felt to be due to some volume depletion from overdiuresis. Despite a day of IV fluid hydration, creatinine has worsened from 3.7-4.7 >4.5, creatinine slightly better There is history of patient taking ibuprofen following treatment for dental abscess and was also hypotensive with EMS with SBP's down to 70s after receiving IV fentanyl.  ATN on the differentials. Clinically appears somewhat volume overloaded. Avoid nephrotoxics.  Holding Lasix and losartan.  Trend daily BMP.  Strict intake output.  Only 900 mL urine output documented during the second shift yesterday.  Already 400 mL since this morning Renal ultrasound without hydronephrosis.  Urine microscopy shows glycosuria, proteinuria >300, rare bacteria, no hematuria plan 6-10 WBCs per hpf.  Not impressive.  Urine sodium less than 10 and urine creatinine 143 Nephrology consultation and follow-up appreciated.  Observation.  Holding diuretics, no  IVF.  Hypokalemia: Replaced.  Acute blood loss anemia: Baseline hemoglobin probably in the 13-14 range Presented with hemoglobin of 12.2 which dropped to 9.7 >9.2.  Relatively  stable last 24 hours. EBL 200 mL Follow CBC daily and transfuse for hemoglobin 7 g or less.  Leukocytosis: Possibly stress response but also had an open fracture Continue IV ceftriaxone and trend daily CBCs. Improving.  Type II DM/IDDM in obese with peripheral neuropathy, hyperglycemia Reports that he does not follow with endocrinology Diabetic using insulin since 1990s A1c of 7 indicates reasonable outpatient control Uses high doses of insulins including Tresiba 50 units daily, U-500, 50 units daily and 150 units at bedtime. Initiated Evaristo Bury equivalent Semglee 50 nights daily, U-500, 50 units twice daily, NovoLog resistant SSI.  Plan to escalate insulins quickly based on CBG readings.  Included bedtime SSI. Diabetes coordinator consulted for assistance. Improved control.  CBGs over the last 3 readings 153 > 143 > 182  Essential hypertension: Transient hypotension with EMS.  Soft blood pressures here.  No orthostatics by therapies from supine to sitting but unable to do standing Continue carvedilol Holding losartan and furosemide Controlled  Hyperlipidemia Continue rosuvastatin.  CK normal.  Hypothyroid TSH: 3.387 Continue home dose of Synthroid  OSA on nightly CPAP/COPD Continue CPAP No clinical bronchospasm  Paroxysmal atrial fibrillation/secondary hypercoagulable state Remains in sinus rhythm on telemetry, telemetry discontinued.  Continue amiodarone Continue Eliquis Follows with Dr. Herbie Baltimore.  Also follows with [name] for PAD.  CAD/PAD/lower extremity venous stasis ulcers S/p CEA, cardiac and peripheral artery stents. Recently taken off Plavix and started on Eliquis.  Continue statins. Follows with outpatient wound care.  Chronic diastolic CHF TTE 02/6107: LVEF 60-45% Diuretics on hold due to AKI May be slightly volume overloaded/left leg edema but no respiratory symptoms.  Prolonged QTc: EKG on admission 11/15 showed QTc of 562 ms.?  Accuracy based on BBB  morphology. Repeat EKG 11/15: QTc 460.  Potassium is 4.  Mag 2.1 Avoid QT prolonging medications Resolved  Anxiety and depression Continue home Abilify and Cymbalta  Ambulatory dysfunction/frequent falls/?  Orthostatic hypotension This was reported by patient and his spouse. Unable to do full orthostatics today Used to use lower extremity compression stockings but those caused him to have leg ulcers Counseled extensively regarding measures to minimize orthostatic symptoms. They claim that providers have made multiple medication adjustment for same but did not see any mention regarding orthostatic hypotension on recent cardiology office visit note from 10/29. Therapies recommend SNF.  Vitamin D insufficiency Initiated vitamin D 1000 units daily. Recommend repeating vitamin D levels in 3 months and if not improved then consider higher dose.  Body mass index is 40.81 kg/m./Morbid obesity Patient on Mounjaro as outpatient in efforts to lose weight.  Nutritional Status Nutrition Problem: Altered nutrition lab value Etiology: chronic illness, other (see comment) (meal pattern at home likely not beneficial for glycemic control) Signs/Symptoms: other (comment) (BG>400) Interventions: MVI, Snacks  Pressure Ulcer:    DVT prophylaxis: SCDs Start: 01/02/23 0811.  Eliquis   Code Status: Full Code:  Family Communication: None at bedside Disposition:  Status is: Inpatient AKI, improving.  Monitor over the next 24 to 48 hours and if continued improvement in nephrology clearance, DC to SNF possibly 11/18.     Consultants:   Orthopedics Nephrology  Procedures:   As noted above  Antimicrobials:   IV ceftriaxone   Subjective:  Denied complaints.  No pain reported but feels that sensation in his right lower extremity are returning  due to wearing of left nerve block.  Urinating.  No chest pain or dyspnea.  Objective:   Vitals:   January 26, 2023 1548 01-26-2023 2106 01/04/23 0432 01/04/23  0730  BP: 112/70 108/60 (!) 159/68 (!) 128/57  Pulse: 67 65 68 65  Resp:  20 20 18   Temp:  (!) 97.5 F (36.4 C) (!) 97.3 F (36.3 C) 98.2 F (36.8 C)  TempSrc:   Oral Oral  SpO2: 95% 97% 99% 99%  Weight:      Height:        General exam: Middle-age male, moderately built and morbidly obese lying comfortably propped up in bed without distress.  Oral mucosa moist. Respiratory system: Clear to auscultation. Respiratory effort normal. Cardiovascular system: S1 & S2 heard, RRR. No JVD, murmurs, rubs, gallops or clicks.  Left leg trace edema.  Telemetry personally reviewed: Sinus rhythm. Gastrointestinal system: Abdomen is nondistended, soft and nontender. No organomegaly or masses felt. Normal bowel sounds heard. Central nervous system: Alert and oriented. No focal neurological deficits. Extremities: Symmetric 5 x 5 power.  Right leg in cast.  Left dorsal foot with healing ulcer with scabbing and no acute findings. Skin: No rashes, lesions or ulcers Psychiatry: Judgement and insight appear normal. Mood & affect appropriate.     Data Reviewed:   I have personally reviewed following labs and imaging studies   CBC: Recent Labs  Lab 01/02/23 0624 Jan 26, 2023 0645 01/04/23 0528  WBC 15.8* 16.5* 13.7*  HGB 12.2* 9.7* 9.2*  HCT 34.8* 27.6* 26.6*  MCV 89.5 89.9 89.6  PLT 202 179 187    Basic Metabolic Panel: Recent Labs  Lab 01/02/23 0624 2023/01/26 0645 Jan 26, 2023 1432 01/04/23 0528  NA 133* 134*  --  138  K 3.7 4.0  --  3.2*  CL 100 99  --  101  CO2 19* 20*  --  26  GLUCOSE 300* 419*  --  138*  BUN 40* 51*  --  62*  CREATININE 3.73* 4.69*  --  4.49*  CALCIUM 7.9* 8.1*  --  8.4*  MG  --   --  2.1  --   PHOS  --   --   --  4.6    Liver Function Tests: Recent Labs  Lab 01/02/23 0624 01/04/23 0528  AST 20  --   ALT 14  --   ALKPHOS 71  --   BILITOT 0.7  --   PROT 4.9*  --   ALBUMIN 2.6* 2.8*    CBG: Recent Labs  Lab Jan 26, 2023 2138 01/04/23 0732 01/04/23 1135   GLUCAP 153* 143* 182*    Microbiology Studies:   Recent Results (from the past 240 hour(s))  Surgical pcr screen     Status: Abnormal   Collection Time: 01/02/23  9:31 AM   Specimen: Nasal Mucosa; Nasal Swab  Result Value Ref Range Status   MRSA, PCR POSITIVE (A) NEGATIVE Final    Comment: RESULT CALLED TO, READ BACK BY AND VERIFIED WITH: [place] ON 11424 @1210H  BY SM    Staphylococcus aureus POSITIVE (A) NEGATIVE Final    Comment: (NOTE) The Xpert SA Assay (FDA approved for NASAL specimens in patients 34 years of age and older), is one component of a comprehensive surveillance program. It is not intended to diagnose infection nor to guide or monitor treatment. Performed at [facility], [address], [place], [place] [address]     Radiology Studies:  US RENAL  Result Date: January 26, 2023 CLINICAL DATA:  Acute  renal insufficiency. EXAM: RENAL / URINARY TRACT ULTRASOUND COMPLETE COMPARISON:  None Available. FINDINGS: Right Kidney: Renal measurements: 12.8 x 8.7 x 6.8 cm = volume: 305 mL. There is moderate parenchyma atrophy and cortical thinning. Normal echogenicity. No hydronephrosis or shadowing stone. Left Kidney: Renal measurements: 10.1 x 6.8 x 6.4 cm = volume: 231 mL. Moderate parenchyma atrophy and cortical thinning. Normal echogenicity. No hydronephrosis or shadowing stone. Bladder: The urinary bladder is not visualized. Other: None. IMPRESSION: Moderately atrophic kidneys.  No hydronephrosis or shadowing stone. Electronically Signed   By: Elgie Collard M.D.   On: 01/03/2023 18:52   DG Ankle Complete Right  Result Date: 01/03/2023 CLINICAL DATA:  Fracture, postop. EXAM: RIGHT ANKLE - COMPLETE 3+ VIEW COMPARISON:  Preoperative imaging. FINDINGS: Lateral plate and screw fixation of distal fibular fracture. Two screws traverse the medial malleolar fracture. A single screw traverses the distal tibia. Two K-wires traverse the hindfoot. Fractures are in improved  alignment from preoperative imaging. Overlying cast material which limits osseous and soft tissue fine detail. IMPRESSION: ORIF of distal tibia and fibula fractures and K wire fixation of the hindfoot. Electronically Signed   By: Narda Rutherford M.D.   On: 01/03/2023 11:22   DG Ankle Complete Right  Result Date: 01/02/2023 CLINICAL DATA:  Elective surgery. EXAM: RIGHT ANKLE - COMPLETE 3+ VIEW COMPARISON:  Preoperative imaging FINDINGS: Thirteen fluoroscopic spot views of the right ankle obtained in the operating room. Lateral plate and screw fixation of distal fibular fracture. Two screws traverse the medial malleolar fracture. Additional screw traverses the distal tibia. Possible K-wire fixation of the hindfoot. Fluoroscopy time 1 minutes 29 seconds. Dose 2.02 mGy. IMPRESSION: Intraoperative fluoroscopy during ankle fracture fixation. Electronically Signed   By: Narda Rutherford M.D.   On: 01/02/2023 17:34   DG C-Arm 1-60 Min-No Report  Result Date: 01/02/2023 Fluoroscopy was utilized by the requesting physician.  No radiographic interpretation.   DG C-Arm 1-60 Min-No Report  Result Date: 01/02/2023 Fluoroscopy was utilized by the requesting physician.  No radiographic interpretation.   DG C-Arm 1-60 Min-No Report  Result Date: 01/02/2023 Fluoroscopy was utilized by the requesting physician.  No radiographic interpretation.    Scheduled Meds:    allopurinol  100 mg Oral Daily   amiodarone  200 mg Oral Daily   apixaban  5 mg Oral BID   ARIPiprazole  5 mg Oral Daily   carvedilol  3.125 mg Oral BID WC   DULoxetine  60 mg Oral Daily   ezetimibe  10 mg Oral Daily   gabapentin  300 mg Oral BID   insulin aspart  0-20 Units Subcutaneous TID WC   insulin aspart  0-5 Units Subcutaneous QHS   insulin glargine-yfgn  50 Units Subcutaneous Daily   insulin regular human CONCENTRATED  50 Units Subcutaneous BID WC   levothyroxine  88 mcg Oral QAC breakfast   multivitamin with minerals  1 tablet  Oral Daily   pantoprazole  40 mg Oral Daily   rosuvastatin  10 mg Oral QPM   sodium chloride flush  3 mL Intravenous Q12H    Continuous Infusions:    cefTRIAXone (ROCEPHIN)  IV 2 g (01/03/23 1742)     LOS: 2 days     Marcellus Scott, MD,  FACP, [facility], [facility], [facility]   [facility]       To contact the attending provider between 7A-7P or the covering provider during after hours 7P-7A, please log into the web site [email or web address] and  access using universal [place] password for that web site. If you do not have the password, please call the hospital operator.  01/04/2023, 12:58 PM

## 2023-01-04 NOTE — Progress Notes (Signed)
[facility] Progress Note   Subjective:   Feeling fine.  PO intake still not robust but doing ok - thinks hold po diuretic 1 more day.  Awaiting SNF by his report.    Objective Vitals:   01/03/23 1548 01/03/23 2106 01/04/23 0432 01/04/23 0730  BP: 112/70 108/60 (!) 159/68 (!) 128/57  Pulse: 67 65 68 65  Resp:  20 20 18   Temp:  (!) 97.5 F (36.4 C) (!) 97.3 F (36.3 C) 98.2 F (36.8 C)  TempSrc:   Oral Oral  SpO2: 95% 97% 99% 99%  Weight:      Height:       Physical Exam General: comfortable flat in bed Heart:RRR Lungs: clear Abdomen: soft, obese Extremities: R cast, L no edema  Additional Objective Labs: Basic Metabolic Panel: Recent Labs  Lab 01/02/23 0624 01/03/23 0645 01/04/23 0528  NA 133* 134* 138  K 3.7 4.0 3.2*  CL 100 99 101  CO2 19* 20* 26  GLUCOSE 300* 419* 138*  BUN 40* 51* 62*  CREATININE 3.73* 4.69* 4.49*  CALCIUM 7.9* 8.1* 8.4*  PHOS  --   --  4.6   Liver Function Tests: Recent Labs  Lab 01/02/23 0624 01/04/23 0528  AST 20  --   ALT 14  --   ALKPHOS 71  --   BILITOT 0.7  --   PROT 4.9*  --   ALBUMIN 2.6* 2.8*   No results for input(s): "LIPASE", "AMYLASE" in the last 168 hours. CBC: Recent Labs  Lab 01/02/23 0624 01/03/23 0645 01/04/23 0528  WBC 15.8* 16.5* 13.7*  HGB 12.2* 9.7* 9.2*  HCT 34.8* 27.6* 26.6*  MCV 89.5 89.9 89.6  PLT 202 179 187   Blood Culture    Component Value Date/Time   SDES STOOL 06/29/2014 1600   SPECREQUEST Normal 06/29/2014 1600   CULT  06/29/2014 1600    NO SALMONELLA, SHIGELLA, CAMPYLOBACTER, YERSINIA, OR E.COLI 0157:H7 ISOLATED Performed at [facility]    REPTSTATUS 07/03/2014 FINAL 06/29/2014 1600    Cardiac Enzymes: Recent Labs  Lab 01/02/23 1644  CKTOTAL 251   CBG: Recent Labs  Lab 01/03/23 0744 01/03/23 1134 01/03/23 1621 01/03/23 2138 01/04/23 0732  GLUCAP 400* 456* 255* 153* 143*   Iron Studies: No results for input(s): "IRON", "TIBC",  "TRANSFERRIN", "FERRITIN" in the last 72 hours. @lablastinr3 @ Studies/Results: US RENAL  Result Date: 01/03/2023 CLINICAL DATA:  Acute renal insufficiency. EXAM: RENAL / URINARY TRACT ULTRASOUND COMPLETE COMPARISON:  None Available. FINDINGS: Right Kidney: Renal measurements: 12.8 x 8.7 x 6.8 cm = volume: 305 mL. There is moderate parenchyma atrophy and cortical thinning. Normal echogenicity. No hydronephrosis or shadowing stone. Left Kidney: Renal measurements: 10.1 x 6.8 x 6.4 cm = volume: 231 mL. Moderate parenchyma atrophy and cortical thinning. Normal echogenicity. No hydronephrosis or shadowing stone. Bladder: The urinary bladder is not visualized. Other: None. IMPRESSION: Moderately atrophic kidneys.  No hydronephrosis or shadowing stone. Electronically Signed   By: Elgie Collard M.D.   On: 01/03/2023 18:52   DG Ankle Complete Right  Result Date: 01/03/2023 CLINICAL DATA:  Fracture, postop. EXAM: RIGHT ANKLE - COMPLETE 3+ VIEW COMPARISON:  Preoperative imaging. FINDINGS: Lateral plate and screw fixation of distal fibular fracture. Two screws traverse the medial malleolar fracture. A single screw traverses the distal tibia. Two K-wires traverse the hindfoot. Fractures are in improved alignment from preoperative imaging. Overlying cast material which limits osseous and soft tissue fine detail. IMPRESSION: ORIF of distal tibia and fibula fractures  and K wire fixation of the hindfoot. Electronically Signed   By: Narda Rutherford M.D.   On: 01/03/2023 11:22   DG Ankle Complete Right  Result Date: 01/02/2023 CLINICAL DATA:  Elective surgery. EXAM: RIGHT ANKLE - COMPLETE 3+ VIEW COMPARISON:  Preoperative imaging FINDINGS: Thirteen fluoroscopic spot views of the right ankle obtained in the operating room. Lateral plate and screw fixation of distal fibular fracture. Two screws traverse the medial malleolar fracture. Additional screw traverses the distal tibia. Possible K-wire fixation of the  hindfoot. Fluoroscopy time 1 minutes 29 seconds. Dose 2.02 mGy. IMPRESSION: Intraoperative fluoroscopy during ankle fracture fixation. Electronically Signed   By: Narda Rutherford M.D.   On: 01/02/2023 17:34   DG C-Arm 1-60 Min-No Report  Result Date: 01/02/2023 Fluoroscopy was utilized by the requesting physician.  No radiographic interpretation.   DG C-Arm 1-60 Min-No Report  Result Date: 01/02/2023 Fluoroscopy was utilized by the requesting physician.  No radiographic interpretation.   DG C-Arm 1-60 Min-No Report  Result Date: 01/02/2023 Fluoroscopy was utilized by the requesting physician.  No radiographic interpretation.   Medications:  cefTRIAXone (ROCEPHIN)  IV 2 g (01/03/23 1742)    allopurinol  100 mg Oral Daily   amiodarone  200 mg Oral Daily   apixaban  5 mg Oral BID   ARIPiprazole  5 mg Oral Daily   carvedilol  3.125 mg Oral BID WC   DULoxetine  60 mg Oral Daily   ezetimibe  10 mg Oral Daily   gabapentin  300 mg Oral BID   insulin aspart  0-20 Units Subcutaneous TID WC   insulin aspart  0-5 Units Subcutaneous QHS   insulin glargine-yfgn  50 Units Subcutaneous Daily   insulin regular human CONCENTRATED  50 Units Subcutaneous BID WC   levothyroxine  88 mcg Oral QAC breakfast   multivitamin with minerals  1 tablet Oral Daily   pantoprazole  40 mg Oral Daily   rosuvastatin  10 mg Oral QPM   sodium chloride flush  3 mL Intravenous Q12H    Assessment/Plan: Harold [name] is an 67 y.o. male HTN, HLD, CAD s/p PCI, COPD, DM2, CKD stage IIIb, PVD, gout, OSA currently admitted with an open ankle fracture and nephrology is consulted for AKI on CKD.   **s/p open ankle R ankle fracture: s/p operative repair 11/14.  PT/OT.   **AKI on CKD 3:  baseline Cr in the mid 2s, now with Cr up to 4.7 in the setting of syncope/hypotensive event but some improvement to 4.5 today and UOP  0.9L yesterday.  Renal US 11/15 c/w CKD no obstruction and UA 11/14 mod blood but no RBC on micro  - will send CK, has known proteinuria.  No current indications for RRT and has had some improvement in past 24h, discussed.  Strict I/Os. Avoid nephrotoxins, hypotension and hypovolemia.  Currently appearing euvolemic and po intake is ok - hold MIVF now, cont to hold diuretics.  Daily labs.    **metabolic acidosis: in setting of AKI, resolved.  **Hypokalemia: repleted   **ABLA: baseline normal, had bleeding prior to arrival then OR - Hb down to 9s post op stable this AM. Transfuse <7 per primary.   **HFpEF: appearing fairly euvolemic currently, low na diet.       **Gout: hold colchicine w his  AKI   *DM type 2: A1c 7, meds per primary.    **HTN: unclear that he has dx; recent hypotension - reports some syncopal episodes over past 6-39mo.  Avoid overtreating BP - BP has been a bit labile here but overall ok.    **A fib on eliquis and amio.    **PAD   WIll follow, please reach out with questions.  Estill Bakes MD 01/04/2023, 11:02 AM  [facility] Pager: [phone]

## 2023-01-04 NOTE — Progress Notes (Signed)
Patient using hospital CPAP unit independently with his home mask.

## 2023-01-05 DIAGNOSIS — N179 Acute kidney failure, unspecified: Secondary | ICD-10-CM | POA: Diagnosis not present

## 2023-01-05 DIAGNOSIS — E11649 Type 2 diabetes mellitus with hypoglycemia without coma: Secondary | ICD-10-CM

## 2023-01-05 DIAGNOSIS — E876 Hypokalemia: Secondary | ICD-10-CM | POA: Diagnosis not present

## 2023-01-05 DIAGNOSIS — N189 Chronic kidney disease, unspecified: Secondary | ICD-10-CM | POA: Diagnosis not present

## 2023-01-05 LAB — CBC
HCT: 25 % — ABNORMAL LOW (ref 39.0–52.0)
Hemoglobin: 8.6 g/dL — ABNORMAL LOW (ref 13.0–17.0)
MCH: 31.4 pg (ref 26.0–34.0)
MCHC: 34.4 g/dL (ref 30.0–36.0)
MCV: 91.2 fL (ref 80.0–100.0)
Platelets: 175 10*3/uL (ref 150–400)
RBC: 2.74 MIL/uL — ABNORMAL LOW (ref 4.22–5.81)
RDW: 14.6 % (ref 11.5–15.5)
WBC: 8.6 10*3/uL (ref 4.0–10.5)
nRBC: 0 % (ref 0.0–0.2)

## 2023-01-05 LAB — RENAL FUNCTION PANEL
Albumin: 2.6 g/dL — ABNORMAL LOW (ref 3.5–5.0)
Anion gap: 8 (ref 5–15)
BUN: 57 mg/dL — ABNORMAL HIGH (ref 8–23)
CO2: 24 mmol/L (ref 22–32)
Calcium: 8.3 mg/dL — ABNORMAL LOW (ref 8.9–10.3)
Chloride: 106 mmol/L (ref 98–111)
Creatinine, Ser: 3.46 mg/dL — ABNORMAL HIGH (ref 0.61–1.24)
GFR, Estimated: 18 mL/min — ABNORMAL LOW (ref >60)
Glucose, Bld: 85 mg/dL (ref 70–99)
Phosphorus: 4.1 mg/dL (ref 2.5–4.6)
Potassium: 3.3 mmol/L — ABNORMAL LOW (ref 3.5–5.1)
Sodium: 138 mmol/L (ref 135–145)

## 2023-01-05 LAB — UREA NITROGEN, URINE: Urea Nitrogen, Ur: 352 mg/dL

## 2023-01-05 LAB — GLUCOSE, CAPILLARY
Glucose-Capillary: 66 mg/dL — ABNORMAL LOW (ref 70–99)
Glucose-Capillary: 161 mg/dL — ABNORMAL HIGH (ref 70–99)
Glucose-Capillary: 183 mg/dL — ABNORMAL HIGH (ref 70–99)
Glucose-Capillary: 236 mg/dL — ABNORMAL HIGH (ref 70–99)

## 2023-01-05 MED ORDER — POTASSIUM CHLORIDE CRYS ER 20 MEQ PO TBCR
40.0000 meq | EXTENDED_RELEASE_TABLET | Freq: Once | ORAL | Status: AC
  Administered 2023-01-05: 40 meq via ORAL
  Filled 2023-01-05: qty 2

## 2023-01-05 MED ORDER — FUROSEMIDE 40 MG PO TABS
80.0000 mg | ORAL_TABLET | Freq: Two times a day (BID) | ORAL | Status: DC
  Administered 2023-01-05 – 2023-01-07 (×4): 80 mg via ORAL
  Filled 2023-01-05 (×4): qty 2

## 2023-01-05 MED ORDER — INSULIN ASPART 100 UNIT/ML IJ SOLN
0.0000 [IU] | Freq: Three times a day (TID) | INTRAMUSCULAR | Status: DC
  Administered 2023-01-05 (×2): 3 [IU] via SUBCUTANEOUS
  Administered 2023-01-06: 5 [IU] via SUBCUTANEOUS
  Administered 2023-01-06: 2 [IU] via SUBCUTANEOUS
  Administered 2023-01-06: 3 [IU] via SUBCUTANEOUS
  Administered 2023-01-07: 2 [IU] via SUBCUTANEOUS

## 2023-01-05 MED ORDER — POTASSIUM CHLORIDE CRYS ER 20 MEQ PO TBCR
40.0000 meq | EXTENDED_RELEASE_TABLET | Freq: Two times a day (BID) | ORAL | Status: AC
  Administered 2023-01-05 (×2): 40 meq via ORAL
  Filled 2023-01-05 (×2): qty 2

## 2023-01-05 NOTE — Progress Notes (Signed)
Harold [name] Progress Note   Subjective:   Feeling fine.  I/Os 1/0.8L  Says PO intake normal. Awaiting SNF by his report.    Objective Vitals:   01/04/23 2022 01/05/23 0339 01/05/23 0500 01/05/23 0731  BP: (!) 128/59 135/63  137/60  Pulse: (!) 59 64  63  Resp: 20 16  17   Temp: 99.5 F (37.5 C) 97.7 F (36.5 C)  (!) 97.5 F (36.4 C)  TempSrc: Oral Oral  Oral  SpO2: 100% 99%    Weight:   (!) 142.4 kg   Height:       Physical Exam General: comfortable flat in bed  Heart:RRR Lungs: clear on RA Abdomen: soft, obese Extremities: R cast, L trace edema thigh  Additional Objective Labs: Basic Metabolic Panel: Recent Labs  Lab 01/03/23 0645 01/04/23 0528 01/05/23 0609  NA 134* 138 138  K 4.0 3.2* 3.3*  CL 99 101 106  CO2 20* 26 24  GLUCOSE 419* 138* 85  BUN 51* 62* 57*  CREATININE 4.69* 4.49* 3.46*  CALCIUM 8.1* 8.4* 8.3*  PHOS  --  4.6 4.1   Liver Function Tests: Recent Labs  Lab 01/02/23 0624 01/04/23 0528 01/05/23 0609  AST 20  --   --   ALT 14  --   --   ALKPHOS 71  --   --   BILITOT 0.7  --   --   PROT 4.9*  --   --   ALBUMIN 2.6* 2.8* 2.6*   No results for input(s): "LIPASE", "AMYLASE" in the last 168 hours. CBC: Recent Labs  Lab 01/02/23 0624 01/03/23 0645 01/04/23 0528 01/05/23 0609  WBC 15.8* 16.5* 13.7* 8.6  HGB 12.2* 9.7* 9.2* 8.6*  HCT 34.8* 27.6* 26.6* 25.0*  MCV 89.5 89.9 89.6 91.2  PLT 202 179 187 175   Blood Culture    Component Value Date/Time   SDES STOOL 06/29/2014 1600   SPECREQUEST Normal 06/29/2014 1600   CULT  06/29/2014 1600    NO SALMONELLA, SHIGELLA, CAMPYLOBACTER, YERSINIA, OR E.COLI 0157:H7 ISOLATED Performed at [facility]    REPTSTATUS 07/03/2014 FINAL 06/29/2014 1600    Cardiac Enzymes: Recent Labs  Lab 01/02/23 1644 01/04/23 0528  CKTOTAL 251 319   CBG: Recent Labs  Lab 01/04/23 0732 01/04/23 1135 01/04/23 1633 01/04/23 2011 01/05/23 0728  GLUCAP 143* 182* 145* 101* 66*    Iron Studies: No results for input(s): "IRON", "TIBC", "TRANSFERRIN", "FERRITIN" in the last 72 hours. @lablastinr3 @ Studies/Results: US RENAL  Result Date: 01/03/2023 CLINICAL DATA:  Acute renal insufficiency. EXAM: RENAL / URINARY TRACT ULTRASOUND COMPLETE COMPARISON:  None Available. FINDINGS: Right Kidney: Renal measurements: 12.8 x 8.7 x 6.8 cm = volume: 305 mL. There is moderate parenchyma atrophy and cortical thinning. Normal echogenicity. No hydronephrosis or shadowing stone. Left Kidney: Renal measurements: 10.1 x 6.8 x 6.4 cm = volume: 231 mL. Moderate parenchyma atrophy and cortical thinning. Normal echogenicity. No hydronephrosis or shadowing stone. Bladder: The urinary bladder is not visualized. Other: None. IMPRESSION: Moderately atrophic kidneys.  No hydronephrosis or shadowing stone. Electronically Signed   By: Elgie Collard M.D.   On: 01/03/2023 18:52   Medications:    allopurinol  100 mg Oral Daily   amiodarone  200 mg Oral Daily   apixaban  5 mg Oral BID   ARIPiprazole  5 mg Oral Daily   carvedilol  3.125 mg Oral BID WC   DULoxetine  60 mg Oral Daily   ezetimibe  10 mg Oral Daily  gabapentin  300 mg Oral BID   insulin aspart  0-20 Units Subcutaneous TID WC   insulin aspart  0-5 Units Subcutaneous QHS   insulin glargine-yfgn  50 Units Subcutaneous Daily   insulin regular human CONCENTRATED  50 Units Subcutaneous BID WC   levothyroxine  88 mcg Oral QAC breakfast   multivitamin with minerals  1 tablet Oral Daily   pantoprazole  40 mg Oral Daily   rosuvastatin  10 mg Oral QPM   sodium chloride flush  3 mL Intravenous Q12H    Assessment/Plan: Harold [name] is an 67 y.o. male HTN, HLD, CAD s/p PCI, COPD, DM2, CKD stage IIIb, PVD, gout, OSA currently admitted with an open ankle fracture and nephrology is consulted for AKI on CKD.   **s/p open ankle R ankle fracture: s/p operative repair 11/14.  PT/OT.   **AKI on CKD 3:  baseline Cr in the mid 2s, now with  Cr up to 4.7 in the setting of syncope/hypotensive event but improving in past 48h with cr down to 4.5 and 3.5 today; anticipate he'll return to baseline in the coming days/weeks. ARB on hold - ok to resume when back to baseline if BP requires.  UOP normal.  Renal US 11/15 c/w CKD no obstruction and UA 11/14 mod blood but no RBC on micro - CK 319, has known proteinuria.  Strict I/Os. Avoid nephrotoxins, hypotension and hypovolemia.  Currently appearing euvolemic and po intake is ok - hold MIVF now, resume outpt daily oral diuretic.  Daily labs.   **Hypokalemia: further repletion today.   **ABLA: baseline normal, had bleeding prior to arrival then OR - Hb down to 9s now 8.6. Transfuse <7 per primary.   **HFpEF: appearing fairly euvolemic currently, low na diet.   Resume outpt diuretic.     **Gout: hold colchicine w his  AKI   *DM type 2: A1c 7, meds per primary.    **HTN: Reports some syncopal episodes over past 6-15mo.  Avoid overtreating BP - BP has been a bit labile here but overall ok.  Losartan 25 daily on hold due to AKI - ok to resume at d/c if needed for BP   **A fib on eliquis and amio.    **PAD   WIll sign off, please re involve nephrology prn. Generally seen q70mo in nephrology clinic - will be due in 05/2023.  As long as PCP checks follow up labs and confirms back to baseline that will be fine.    Estill Bakes MD 01/05/2023, 10:25 AM  [name] Pager: [phone]

## 2023-01-05 NOTE — Progress Notes (Signed)
PROGRESS NOTE   Harold RITTHALER  AOZ:[id number]    DOB: [birth year]    DOA: 01/02/2023  PCP: [name], MD   I have briefly reviewed patients previous medical records in [facility].  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  Admitted for open right occult fracture sustained after a mechanical fall, suspected due to orthostatic hypotension, acute on chronic kidney disease.  Orthopedics consulted and underwent I&D and ORIF of right ankle fracture 11/14.  Nephrology consulted for progressive AKI.  Eventual SNF pending clinical improvement and stability.  Should be medically optimized for DC possibly 11/18.   Assessment & Plan:  Principal Problem:   Open ankle fracture Active Problems:   Fall at home, initial encounter   Leukocytosis   Acute kidney injury superimposed on chronic kidney disease (HCC)   Normocytic anemia   Paroxysmal atrial fibrillation (HCC)   Uncontrolled type 2 diabetes mellitus with hyperglycemia, with long-term current use of insulin (HCC)   Prolonged QT interval   Chronic heart failure with preserved ejection fraction (HFpEF) (HCC)   Essential hypertension   Hypothyroidism   CAD, LAD/CFX DES 04/28/12- staged RCA DES 04/29/12 after pt declined CABG, cath 05/14/13 stable CAD medical therapy   PVD (peripheral vascular disease) (HCC)   COPD (chronic obstructive pulmonary disease) (HCC)   Anxiety and depression   OSA on CPAP    Obesity, Class III, BMI 40-49.9 (morbid obesity) (HCC)   Right open ankle fracture: Sustained post mechanical fall after symptoms suggestive of orthostatic hypotension Orthopedics consulted and underwent I&D and ORIF of right ankle fracture 11/14.   Orthopedics follow-up appreciated: NWB RLE x 8 weeks, no ankle motion, move knee as tolerated, mobilize with walker, will remain in splint x 2 weeks then will be converted to a short leg cast.  Remains on Eliquis for DVT prophylaxis.  Vitamin D levels 15.05, replace and follow. Therapies recommend SNF.  As per CIR coordinator note, does not appear to have necessity to warrant CIR admission S/p tetanus shot. Completed 3 days of IV ceftriaxone per orthopedics.  Acute kidney injury complicating stage IIIb CKD: Follows with Dr. Marisue Humble, [facility] Baseline creatinine 1.8-2 Presented with creatinine of 3.73 and BUN of 40. Creatinine had peaked to 4.7  DD of AKI: ATN from hypotension, NSAIDs, Lasix and losartan Renal ultrasound without hydronephrosis.  Urine microscopy shows glycosuria, proteinuria >300, rare bacteria, no hematuria plan 6-10 WBCs per hpf.  Not impressive.  Urine sodium less than 10 and urine creatinine 143 Above meds were held.  Brief IV fluids on day 1 and then not continued.  Monitoring daily BMP and creatinine steadily improving, down to 3.5 Nephrology consultation and follow-up appreciated.  Observation.  Holding diuretics, no IVF.   Hypokalemia: Continue to replace and follow.  Magnesium 2.1 earlier  Acute blood loss anemia: Baseline hemoglobin probably in the 13-14 range Presented with hemoglobin of 12.2 which dropped to 9.7 >9.2 >8.6.   EBL 200 mL Follow CBC daily and transfuse for  hemoglobin 7 g or less.  Leukocytosis: Possibly stress response but also had an open fracture Completed 3 days of IV ceftriaxone Resolved.  Type II DM/IDDM in obese with peripheral neuropathy, hyperglycemia/hypoglycemia Reports that he does not  follow with endocrinology Diabetic using insulin since 1990s A1c of 7 indicates reasonable outpatient control Uses high doses of insulins including Tresiba 50 units daily, U-500, 50 units daily and 150 units at bedtime. Initiated Evaristo Bury equivalent Semglee 50 nights daily, U-500, 50 units twice daily, NovoLog resistant SSI.  Plan to escalate insulins quickly based on CBG readings.  Included bedtime SSI. Diabetes coordinator consulted for assistance. Mostly controlled in the hospital but had a hypoglycemic event of 66 mg per DL earlier this morning. Has not required bedtime SSI over the last 2 nights, discontinued bedtime scale.  Given hypoglycemia this morning and tight glycemic control, requiring low doses of SSI during the daytime, changed resistant scale to moderate.  Monitor closely.  Essential hypertension: Transient hypotension with EMS.  Now normotensive Continue carvedilol Holding losartan and furosemide  Hyperlipidemia Continue rosuvastatin.  CK normal.  Hypothyroid TSH: 3.387 Continue home dose of Synthroid  OSA on nightly CPAP/COPD Continue CPAP No clinical bronchospasm  Paroxysmal atrial fibrillation/secondary hypercoagulable state Remains in sinus rhythm on telemetry, telemetry discontinued.  Continue amiodarone Continue Eliquis Follows with Dr. Herbie Baltimore.  Also follows with [name] for PAD.  CAD/PAD/lower extremity venous stasis ulcers S/p CEA, cardiac and peripheral artery stents. Recently taken off Plavix and started on Eliquis.  Continue statins. Follows with outpatient wound care.  Chronic diastolic CHF TTE 02/6107: LVEF 60-45% Diuretics on hold due to AKI May be slightly volume overloaded/left leg edema but no respiratory symptoms.  Prolonged QTc: EKG on admission 11/15 showed QTc of 562 ms.?  Accuracy based on BBB morphology. Repeat EKG 11/15: QTc 460.  Potassium is 4.  Mag 2.1 Avoid QT prolonging medications Resolved  Anxiety and depression Continue  home Abilify and Cymbalta  Ambulatory dysfunction/frequent falls/?  Orthostatic hypotension This was reported by patient and his spouse. Unable to do full orthostatics today Used to use lower extremity compression stockings but those caused him to have leg ulcers Counseled extensively regarding measures to minimize orthostatic symptoms. They claim that providers have made multiple medication adjustment for same but did not see any mention regarding orthostatic hypotension on recent cardiology office visit note from 10/29. Therapies recommend SNF.  Vitamin D insufficiency Initiated vitamin D 1000 units daily. Recommend repeating vitamin D levels in 3 months and if not improved then consider higher dose.  Body mass index is 41.43 kg/m./Morbid obesity Patient on Mounjaro as outpatient in efforts to lose weight.  Nutritional Status Nutrition Problem: Altered nutrition lab value Etiology: chronic illness, other (see comment) (meal pattern at home likely not beneficial for glycemic control) Signs/Symptoms: other (comment) (BG>400) Interventions: MVI, Snacks  Pressure Ulcer:    DVT prophylaxis: SCDs Start: 01/02/23 0811.  Eliquis   Code Status: Full Code:  Family Communication: None at bedside Disposition:  Status is: Inpatient AKI, improving.  Monitor over the next 24 hours and if continued improvement and nephrology clearance, DC to SNF possibly 11/18.     Consultants:   Orthopedics Nephrology  Procedures:   As noted above  Antimicrobials:   IV ceftriaxone   Subjective:  Denies complaints.  No right lower extremity pain.  Urinating well.  No chest pain or dyspnea.  Tolerating diet.  No BM since 3 to 4 days but does not want to try suppositories at  this time.  Objective:   Vitals:   01/04/23 2022 01/05/23 0339 01/05/23 0500 01/05/23 0731  BP: (!) 128/59 135/63  137/60  Pulse: (!) 59 64  63  Resp: 20 16  17   Temp: 99.5 F (37.5 C) 97.7 F (36.5 C)  (!) 97.5 F  (36.4 C)  TempSrc: Oral Oral  Oral  SpO2: 100% 99%    Weight:   (!) 142.4 kg   Height:        General exam: Middle-age male, moderately built and morbidly obese lying comfortably propped up in bed without distress.  Oral mucosa moist. Respiratory system: Clear to auscultation. Respiratory effort normal. Cardiovascular system: S1 & S2 heard, RRR. No JVD, murmurs, rubs, gallops or clicks.  No leg edema.  Off of telemetry Gastrointestinal system: Abdomen is nondistended, soft and nontender. No organomegaly or masses felt. Normal bowel sounds heard. Central nervous system: Alert and oriented. No focal neurological deficits. Extremities: Symmetric 5 x 5 power.  Right leg in cast.  Shows me that he is able to wiggle his toes well.  Left dorsal foot with healing ulcer with scabbing and no acute findings. Skin: No rashes, lesions or ulcers Psychiatry: Judgement and insight appear normal. Mood & affect appropriate.     Data Reviewed:   I have personally reviewed following labs and imaging studies   CBC: Recent Labs  Lab 01/03/23 0645 01/04/23 0528 01/05/23 0609  WBC 16.5* 13.7* 8.6  HGB 9.7* 9.2* 8.6*  HCT 27.6* 26.6* 25.0*  MCV 89.9 89.6 91.2  PLT 179 187 175    Basic Metabolic Panel: Recent Labs  Lab 01/02/23 0624 01/03/23 0645 01/03/23 1432 01/04/23 0528 01/05/23 0609  NA 133* 134*  --  138 138  K 3.7 4.0  --  3.2* 3.3*  CL 100 99  --  101 106  CO2 19* 20*  --  26 24  GLUCOSE 300* 419*  --  138* 85  BUN 40* 51*  --  62* 57*  CREATININE 3.73* 4.69*  --  4.49* 3.46*  CALCIUM 7.9* 8.1*  --  8.4* 8.3*  MG  --   --  2.1  --   --   PHOS  --   --   --  4.6 4.1    Liver Function Tests: Recent Labs  Lab 01/02/23 0624 01/04/23 0528 01/05/23 0609  AST 20  --   --   ALT 14  --   --   ALKPHOS 71  --   --   BILITOT 0.7  --   --   PROT 4.9*  --   --   ALBUMIN 2.6* 2.8* 2.6*    CBG: Recent Labs  Lab 01/04/23 1633 01/04/23 2011 01/05/23 0728  GLUCAP 145* 101* 66*     Microbiology Studies:   Recent Results (from the past 240 hour(s))  Surgical pcr screen     Status: Abnormal   Collection Time: 01/02/23  9:31 AM   Specimen: Nasal Mucosa; Nasal Swab  Result Value Ref Range Status   MRSA, PCR POSITIVE (A) NEGATIVE Final    Comment: RESULT CALLED TO, READ BACK BY AND VERIFIED WITH: [place] ON 11424 @1210H  BY SM    Staphylococcus aureus POSITIVE (A) NEGATIVE Final    Comment: (NOTE) The Xpert SA Assay (FDA approved for NASAL specimens in patients 8 years of age and older), is one component of a comprehensive surveillance program. It is not intended to diagnose infection nor to guide or monitor treatment. Performed  at [facility], [address] [place], [place] [address]     Radiology Studies:  US RENAL  Result Date: 01-30-2023 CLINICAL DATA:  Acute renal insufficiency. EXAM: RENAL / URINARY TRACT ULTRASOUND COMPLETE COMPARISON:  None Available. FINDINGS: Right Kidney: Renal measurements: 12.8 x 8.7 x 6.8 cm = volume: 305 mL. There is moderate parenchyma atrophy and cortical thinning. Normal echogenicity. No hydronephrosis or shadowing stone. Left Kidney: Renal measurements: 10.1 x 6.8 x 6.4 cm = volume: 231 mL. Moderate parenchyma atrophy and cortical thinning. Normal echogenicity. No hydronephrosis or shadowing stone. Bladder: The urinary bladder is not visualized. Other: None. IMPRESSION: Moderately atrophic kidneys.  No hydronephrosis or shadowing stone. Electronically Signed   By: Elgie Collard M.D.   On: 2023-01-30 18:52    Scheduled Meds:    allopurinol  100 mg Oral Daily   amiodarone  200 mg Oral Daily   apixaban  5 mg Oral BID   ARIPiprazole  5 mg Oral Daily   carvedilol  3.125 mg Oral BID WC   DULoxetine  60 mg Oral Daily   ezetimibe  10 mg Oral Daily   gabapentin  300 mg Oral BID   insulin aspart  0-20 Units Subcutaneous TID WC   insulin aspart  0-5 Units Subcutaneous QHS   insulin glargine-yfgn  50 Units  Subcutaneous Daily   insulin regular human CONCENTRATED  50 Units Subcutaneous BID WC   levothyroxine  88 mcg Oral QAC breakfast   multivitamin with minerals  1 tablet Oral Daily   pantoprazole  40 mg Oral Daily   potassium chloride  40 mEq Oral BID   rosuvastatin  10 mg Oral QPM   sodium chloride flush  3 mL Intravenous Q12H    Continuous Infusions:       LOS: 3 days     Marcellus Scott, MD,  FACP, [facility], [facility], [facility]   [facility] [place]      To contact the attending provider between 7A-7P or the covering provider during after hours 7P-7A, please log into the web site [email or web address] and access using universal [place] password for that web site. If you do not have the password, please call the hospital operator.  01/05/2023, 10:28 AM

## 2023-01-05 NOTE — [place] FL2 (Signed)
[place] MEDICAID FL2 LEVEL OF CARE FORM     IDENTIFICATION  Patient Name: Harold [name] Birthdate: 1955-09-25 Sex: male Admission Date (Current Location): 01/02/2023  [facility] and [place] Number:  Producer, television/film/video and Address:  [facility], [address] [place], [place] [address]      Provider Number: [id number]  Attending Physician Name and Address:  Elease Etienne, MD  Relative Name and Phone Number:       Current Level of Care: Hospital Recommended Level of Care: Skilled Nursing Facility Prior Approval Number:    Date Approved/Denied:   PASRR Number: [id number]  Discharge Plan: SNF    Current Diagnoses: Patient Active Problem List   Diagnosis Date Noted   Open ankle fracture 01/02/2023   Prolonged QT interval 01/02/2023   Fall at home, initial encounter 01/02/2023   Leukocytosis 01/02/2023   Acute kidney injury superimposed on chronic kidney disease (HCC) 01/02/2023   Normocytic anemia 01/02/2023   Paroxysmal atrial fibrillation (HCC) 01/02/2023   Uncontrolled type 2 diabetes mellitus with hyperglycemia, with long-term current use of insulin (HCC) 01/02/2023   Hypothyroidism 01/02/2023   CKD stage 3b, GFR 30-44 ml/min (HCC) 11/07/2022   CAD in native artery 11/07/2022   PVD (peripheral vascular disease) (HCC) 11/06/2022   Hypercoagulable state due to persistent atrial fibrillation (HCC) 02/19/2022   Persistent atrial fibrillation (HCC) 01/28/2022   Lumbar stenosis with neurogenic claudication 07/26/2020   Diabetic neuropathy (HCC) 08/24/2019   Contusion of right elbow 09/02/2018   Trigger thumb of left hand 09/02/2018   Atherosclerosis of native artery of both lower extremities with intermittent claudication (HCC) 05/12/2018   Pain of left hand 03/26/2018   Trigger finger 03/03/2017   Pain in finger of left hand 03/03/2017   Snoring 08/20/2016   Morbid (severe) obesity due to excess calories (HCC) 04/18/2016    Venous stasis of both lower extremities - with edema 02/25/2015   Right-sided chest wall pain 05/08/2014   Gastroesophageal reflux disease without esophagitis 10/10/2013   Chronic heart failure with preserved ejection fraction (HFpEF) (HCC) 06/27/2013   COPD (chronic obstructive pulmonary disease) (HCC) 06/17/2013   Restrictive lung disease 06/17/2013   Myalgia and myositis 04/20/2013   Aftercare following surgery of the circulatory system, NEC 02/22/2013   Obesity, Class III, BMI 40-49.9 (morbid obesity) (HCC) 09/01/2012   Fatigue 09/01/2012   Preoperative cardiovascular examination 09/01/2012   Chronic renal insufficiency, stage II (mild) 04/30/2012   OSA on CPAP 04/29/2012   Pulmonary hypertension, 04/29/2012   Dyspnea on exertion   04/26/2012   CAD, LAD/CFX DES 04/28/12- staged RCA DES 04/29/12 after pt declined CABG, cath 05/14/13 stable CAD medical therapy    Carotid artery stenosis without cerebral infarction, bilateral 11/07/2011   Diabetes mellitus type 2 with neurological manifestations (HCC) 04/29/2011   Essential hypertension 04/29/2011   Neuropathy 04/29/2011   Hyperlipidemia associated with type 2 diabetes mellitus (HCC) 04/29/2011   Anxiety and depression 04/29/2011    Orientation RESPIRATION BLADDER Height & Weight     Self, Time, Situation, Place  Normal Continent Weight: (!) 314 lb (142.4 kg) Height:  6\' 1"  (185.4 cm)  BEHAVIORAL SYMPTOMS/MOOD NEUROLOGICAL BOWEL NUTRITION STATUS      Continent Diet (See DC summary)  AMBULATORY STATUS COMMUNICATION OF NEEDS Skin   Extensive Assist Verbally Normal, Surgical wounds                       Personal Care Assistance Level  of Assistance  Bathing, Feeding, Dressing Bathing Assistance: Maximum assistance Feeding assistance: Limited assistance Dressing Assistance: Maximum assistance     Functional Limitations Info  Sight, Hearing, Speech Sight Info: Adequate Hearing Info: Adequate Speech Info: Adequate     SPECIAL CARE FACTORS FREQUENCY  PT (By licensed PT), OT (By licensed OT)     PT Frequency: 5x a week OT Frequency: 5x a week            Contractures Contractures Info: Not present    Additional Factors Info  Code Status, Allergies Code Status Info: Full Allergies Info: Septra (Bactrim)  Penicillins  Metformin And Related  Other  Sulfamethoxazole-trimethoprim  Wellbutrin (Bupropion)  Doxycycline           Current Medications (01/05/2023):  This is the current hospital active medication list Current Facility-Administered Medications  Medication Dose Route Frequency Provider Last Rate Last Admin   acetaminophen (TYLENOL) tablet 650 mg  650 mg Oral Q6H PRN Montez Morita, PA-C   650 mg at 01/04/23 2105   Or   acetaminophen (TYLENOL) suppository 650 mg  650 mg Rectal Q6H PRN Montez Morita, PA-C       albuterol (PROVENTIL) (2.5 MG/3ML) 0.083% nebulizer solution 2.5 mg  2.5 mg Nebulization Q6H PRN Montez Morita, PA-C       allopurinol (ZYLOPRIM) tablet 100 mg  100 mg Oral Daily Smith, Rondell A, MD   100 mg at 01/05/23 0920   alum & mag hydroxide-simeth (MAALOX/MYLANTA) 200-200-20 MG/5ML suspension 30 mL  30 mL Oral Q4H PRN Madelyn Flavors A, MD   30 mL at 01/03/23 0641   amiodarone (PACERONE) tablet 200 mg  200 mg Oral Daily Madelyn Flavors A, MD   200 mg at 01/05/23 0920   apixaban (ELIQUIS) tablet 5 mg  5 mg Oral BID Ilda Basset, RPH   5 mg at 01/05/23 0919   ARIPiprazole (ABILIFY) tablet 5 mg  5 mg Oral Daily Madelyn Flavors A, MD   5 mg at 01/05/23 0919   carvedilol (COREG) tablet 3.125 mg  3.125 mg Oral BID WC Smith, Rondell A, MD   3.125 mg at 01/05/23 0920   DULoxetine (CYMBALTA) DR capsule 60 mg  60 mg Oral Daily Madelyn Flavors A, MD   60 mg at 01/05/23 0920   ezetimibe (ZETIA) tablet 10 mg  10 mg Oral Daily Madelyn Flavors A, MD   10 mg at 01/05/23 0919   fentaNYL (SUBLIMAZE) injection 25 mcg  25 mcg Intravenous Q2H PRN Montez Morita, PA-C   25 mcg at 01/05/23 5573    furosemide (LASIX) tablet 80 mg  80 mg Oral BID Tyler Pita, MD       gabapentin (NEURONTIN) capsule 300 mg  300 mg Oral BID Marcellus Scott D, MD   300 mg at 01/05/23 0919   hydrALAZINE (APRESOLINE) injection 10 mg  10 mg Intravenous Q4H PRN Montez Morita, PA-C       HYDROcodone-acetaminophen (NORCO/VICODIN) 5-325 MG per tablet 1 tablet  1 tablet Oral Q6H PRN Montez Morita, PA-C   1 tablet at 01/05/23 0919   insulin aspart (novoLOG) injection 0-15 Units  0-15 Units Subcutaneous TID WC Hongalgi, Maximino Greenland, MD       insulin glargine-yfgn [facility]) injection 50 Units  50 Units Subcutaneous Daily Montez Morita, PA-C   50 Units at 01/05/23 2202   Insulin Pen Needle (NOVOFINE) 10 each  1 packet Subcutaneous PRN Hongalgi, Maximino [place], MD       insulin regular human CONCENTRATED (  HUMULIN R) 500 UNIT/ML KwikPen 50 Units  50 Units Subcutaneous BID WC Elease Etienne, MD   50 Units at 01/05/23 1610   levothyroxine (SYNTHROID) tablet 88 mcg  88 mcg Oral QAC breakfast Clydie Braun, MD   88 mcg at 01/05/23 0640   multivitamin with minerals tablet 1 tablet  1 tablet Oral Daily Elease Etienne, MD   1 tablet at 01/05/23 0920   Oral care mouth rinse  15 mL Mouth Rinse PRN Hongalgi, Maximino Green[place], MD       pantoprazole (PROTONIX) EC tablet 40 mg  40 mg Oral Daily Smith, Rondell A, MD   40 mg at 01/05/23 0919   potassium chloride SA (KLOR-CON M) CR tablet 40 mEq  40 mEq Oral BID Tyler Pita, MD   40 mEq at 01/05/23 1128   rosuvastatin (CRESTOR) tablet 10 mg  10 mg Oral QPM Hongalgi, Anand D, MD   10 mg at 01/04/23 1711   sodium chloride flush (NS) 0.9 % injection 3 mL  3 mL Intravenous Q12H Montez Morita, PA-C   3 mL at 01/05/23 9604   trimethobenzamide (TIGAN) injection 200 mg  200 mg Intramuscular Q6H PRN Montez Morita, PA-C         Discharge Medications: Please see discharge summary for a list of discharge medications.  Relevant Imaging Results:  Relevant Lab Results:   Additional Information SSN  [id number]  [place], [place]

## 2023-01-05 NOTE — Progress Notes (Signed)
     Subjective:  Patient reports pain as mild.  He has been doing better, and has now been capable of transferring from the bed to a bedside commode.  Objective:   VITALS:   Vitals:   01/04/23 2022 01/05/23 0339 01/05/23 0500 01/05/23 0731  BP: (!) 128/59 135/63  137/60  Pulse: (!) 59 64  63  Resp: 20 16  17   Temp: 99.5 F (37.5 C) 97.7 F (36.5 C)  (!) 97.5 F (36.4 C)  TempSrc: Oral Oral  Oral  SpO2: 100% 99%    Weight:   (!) 142.4 kg   Height:        Dorsiflexion/Plantar flexion intact Incision: no drainage Splint is intact.  Lab Results  Component Value Date   WBC 8.6 01/05/2023   HGB 8.6 (L) 01/05/2023   HCT 25.0 (L) 01/05/2023   MCV 91.2 01/05/2023   PLT 175 01/05/2023   BMET    Component Value Date/Time   NA 138 01/05/2023 0609   NA 141 11/14/2022 1624   K 3.3 (L) 01/05/2023 0609   CL 106 01/05/2023 0609   CO2 24 01/05/2023 0609   GLUCOSE 85 01/05/2023 0609   BUN 57 (H) 01/05/2023 0609   BUN 23 11/14/2022 1624   CREATININE 3.46 (H) 01/05/2023 0609   CREATININE 1.51 (H) 10/16/2015 1053   CALCIUM 8.3 (L) 01/05/2023 0609   EGFR 32 (L) 11/14/2022 1624   GFRNONAA 18 (L) 01/05/2023 0609   GFRNONAA 49 (L) 10/16/2015 1053     Assessment/Plan: 3 Days Post-Op   Principal Problem:   Open ankle fracture Active Problems:   Essential hypertension   Anxiety and depression   CAD, LAD/CFX DES 04/28/12- staged RCA DES 04/29/12 after pt declined CABG, cath 05/14/13 stable CAD medical therapy   OSA on CPAP   Obesity, Class III, BMI 40-49.9 (morbid obesity) (HCC)   COPD (chronic obstructive pulmonary disease) (HCC)   Chronic heart failure with preserved ejection fraction (HFpEF) (HCC)   PVD (peripheral vascular disease) (HCC)   Prolonged QT interval   Fall at home, initial encounter   Leukocytosis   Acute kidney injury superimposed on chronic kidney disease (HCC)   Normocytic anemia   Paroxysmal atrial fibrillation (HCC)   Uncontrolled type 2 diabetes  mellitus with hyperglycemia, with long-term current use of insulin (HCC)   Hypothyroidism  Continue nonweightbearing, physical therapy,  Weightbearing: NWB RLE x 8 wks ROM/Precautions:  No ankle motion, move knee as tolerated Mobility: Continue with PT/OT Insicional and dressing care: Daily PRN Pain management: multimodal  Antibiotics: Periop abx + Rocephin for open fracture x 72 hrs Diet: Advance as tolerated VTE prophylaxis: Resume home eliquis Dispo: SNF vs CIR Follow - up plan: in office in 2 weeks for wound check and transition from splint to [employer] 01/05/2023, 8:05 AM   Teryl Lucy, MD Cell [phone]

## 2023-01-05 NOTE — Plan of Care (Signed)

## 2023-01-05 NOTE — Progress Notes (Signed)
   01/05/23 2347  BiPAP/CPAP/SIPAP  Mask Type Nasal pillows  FiO2 (%) 21 %  Patient Home Equipment No  Auto Titrate Yes  BiPAP/CPAP /SiPAP Vitals  Pulse Rate 64  Resp 16  SpO2 99 %

## 2023-01-06 ENCOUNTER — Encounter (HOSPITAL_COMMUNITY): Payer: Self-pay | Admitting: Orthopedic Surgery

## 2023-01-06 DIAGNOSIS — N189 Chronic kidney disease, unspecified: Secondary | ICD-10-CM | POA: Diagnosis not present

## 2023-01-06 DIAGNOSIS — E11649 Type 2 diabetes mellitus with hypoglycemia without coma: Secondary | ICD-10-CM | POA: Diagnosis not present

## 2023-01-06 DIAGNOSIS — N179 Acute kidney failure, unspecified: Secondary | ICD-10-CM | POA: Diagnosis not present

## 2023-01-06 DIAGNOSIS — E876 Hypokalemia: Secondary | ICD-10-CM | POA: Diagnosis not present

## 2023-01-06 LAB — RENAL FUNCTION PANEL
Albumin: 2.5 g/dL — ABNORMAL LOW (ref 3.5–5.0)
Anion gap: 5 (ref 5–15)
BUN: 48 mg/dL — ABNORMAL HIGH (ref 8–23)
CO2: 27 mmol/L (ref 22–32)
Calcium: 8.5 mg/dL — ABNORMAL LOW (ref 8.9–10.3)
Chloride: 107 mmol/L (ref 98–111)
Creatinine, Ser: 2.96 mg/dL — ABNORMAL HIGH (ref 0.61–1.24)
GFR, Estimated: 22 mL/min — ABNORMAL LOW (ref >60)
Glucose, Bld: 205 mg/dL — ABNORMAL HIGH (ref 70–99)
Phosphorus: 2.7 mg/dL (ref 2.5–4.6)
Potassium: 4.2 mmol/L (ref 3.5–5.1)
Sodium: 139 mmol/L (ref 135–145)

## 2023-01-06 LAB — CBC
HCT: 26 % — ABNORMAL LOW (ref 39.0–52.0)
Hemoglobin: 8.8 g/dL — ABNORMAL LOW (ref 13.0–17.0)
MCH: 31.2 pg (ref 26.0–34.0)
MCHC: 33.8 g/dL (ref 30.0–36.0)
MCV: 92.2 fL (ref 80.0–100.0)
Platelets: 159 10*3/uL (ref 150–400)
RBC: 2.82 MIL/uL — ABNORMAL LOW (ref 4.22–5.81)
RDW: 14.5 % (ref 11.5–15.5)
WBC: 6 10*3/uL (ref 4.0–10.5)
nRBC: 0 % (ref 0.0–0.2)

## 2023-01-06 LAB — GLUCOSE, CAPILLARY
Glucose-Capillary: 177 mg/dL — ABNORMAL HIGH (ref 70–99)
Glucose-Capillary: 215 mg/dL — ABNORMAL HIGH (ref 70–99)
Glucose-Capillary: 143 mg/dL — ABNORMAL HIGH (ref 70–99)
Glucose-Capillary: 102 mg/dL — ABNORMAL HIGH (ref 70–99)

## 2023-01-06 MED ORDER — VITAMIN D 25 MCG (1000 UNIT) PO TABS
2000.0000 [IU] | ORAL_TABLET | Freq: Two times a day (BID) | ORAL | Status: DC
  Administered 2023-01-06 – 2023-01-07 (×3): 2000 [IU] via ORAL
  Filled 2023-01-06 (×3): qty 2

## 2023-01-06 MED ORDER — HYDROCODONE-ACETAMINOPHEN 5-325 MG PO TABS: 1.0000 | ORAL_TABLET | Freq: Four times a day (QID) | ORAL | 0 refills | Status: DC | PRN

## 2023-01-06 MED ORDER — VITAMIN D 125 MCG (5000 UT) PO CAPS: 1.0000 | ORAL_CAPSULE | Freq: Every day | ORAL | 6 refills | Status: DC

## 2023-01-06 NOTE — Progress Notes (Addendum)
PROGRESS NOTE   Harold [name]  ZOX:[id number]    DOB: 1956-01-19    DOA: 01/02/2023  PCP: [name], MD   I have briefly reviewed patients previous medical records in [facility].  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  Admitted for open right occult fracture sustained after a mechanical fall, suspected due to orthostatic hypotension, acute on chronic kidney disease.  Orthopedics consulted and underwent I&D and ORIF of right ankle fracture 11/14.  Nephrology consulted for progressive AKI.  Eventual SNF pending clinical improvement and stability.  Should be medically optimized for DC possibly 11/18.   Assessment & Plan:  Principal Problem:   Open ankle fracture Active Problems:   Fall at home, initial encounter   Leukocytosis   Acute kidney injury superimposed on chronic kidney disease (HCC)   Normocytic anemia   Paroxysmal atrial fibrillation (HCC)   Uncontrolled type 2 diabetes mellitus with hyperglycemia, with long-term current use of insulin (HCC)   Prolonged QT interval   Chronic heart failure with preserved ejection fraction (HFpEF) (HCC)   Essential hypertension   Hypothyroidism   CAD, LAD/CFX DES 04/28/12- staged RCA DES 04/29/12 after pt declined CABG, cath 05/14/13 stable CAD medical therapy   PVD (peripheral vascular disease) (HCC)   COPD (chronic obstructive pulmonary disease) (HCC)   Anxiety and depression   OSA on CPAP    Obesity, Class III, BMI 40-49.9 (morbid obesity) (HCC)   Right open ankle fracture: Sustained post mechanical fall after symptoms suggestive of orthostatic hypotension Orthopedics consulted and underwent I&D and ORIF of right ankle fracture 11/14.   Orthopedics follow-up appreciated: NWB RLE x 8 weeks, no ankle motion, move knee as tolerated, mobilize with walker, will remain in splint x 2 weeks then will be converted to a short leg cast.  Remains on Eliquis for DVT prophylaxis.  Vitamin D levels 15.05, replace and follow. Therapies recommend SNF.  As per CIR coordinator note, does not appear to have necessity to warrant CIR admission S/p tetanus shot. Completed 3 days of IV ceftriaxone per orthopedics.  Acute kidney injury complicating stage IIIb CKD: Follows with Dr. Marisue Humble, [facility] Baseline creatinine 1.8-2 Presented with creatinine of 3.73 and BUN of 40. Creatinine had peaked to 4.7  DD of AKI: ATN from hypotension, NSAIDs, Lasix and losartan Renal ultrasound without hydronephrosis.  Urine microscopy shows glycosuria, proteinuria >300, rare bacteria, no hematuria plan 6-10 WBCs per hpf.  Not impressive.  Urine sodium less than 10 and urine creatinine 143 Above meds were held.  Brief IV fluids on day 1 and then not continued.  Monitoring daily BMP and creatinine steadily improving, down to 3.5 Nephrology consultation and follow-up appreciated.  Observation.  Diuretics were briefly held.  She was restarted on Lasix 80 mg twice daily on 11/17 with recommendation to continue holding losartan and resume at DC if needed for hypertension. Creatinine improving, down to 2.96  Hypokalemia: Replaced.  Magnesium 2.1 earlier  Acute blood loss anemia: Baseline hemoglobin probably  in the 13-14 range Presented with hemoglobin of 12.2 which dropped to 9.7 >9.2 >8.6 >8.8.   EBL 200 mL Follow CBC daily and transfuse for hemoglobin 7 g or less.  Leukocytosis: Possibly stress response but also had an open  fracture Completed 3 days of IV ceftriaxone Resolved.  Type II DM/IDDM in obese with peripheral neuropathy, hyperglycemia/hypoglycemia Reports that he does not follow with endocrinology Diabetic using insulin since 1990s A1c of 7 indicates reasonable outpatient control Uses high doses of insulins including Tresiba 50 units daily, U-500, 50 units daily and 150 units at bedtime. Initiated Evaristo Bury equivalent Semglee 50 nights daily, U-500, 50 units twice daily, NovoLog resistant SSI.  Plan to escalate insulins quickly based on CBG readings.  Included bedtime SSI. Diabetes coordinator consulted for assistance. Mostly controlled in the hospital but had a hypoglycemic event of 66 mg per DL earlier this morning. Has not required bedtime SSI over the last 2 nights, discontinued bedtime scale.  Given hypoglycemia 11/17 morning and tight glycemic control, requiring low doses of SSI during the daytime, changed resistant scale to moderate.  Monitor closely.  No further hypoglycemic episodes.  Essential hypertension: Transient hypotension with EMS.  Now normotensive Continue carvedilol Holding losartan.  Resumed oral furosemide. Blood pressure is reasonably controlled.  Hyperlipidemia Continue rosuvastatin.  CK normal.  Hypothyroid TSH: 3.387 Continue home dose of Synthroid  OSA on nightly CPAP/COPD Continue CPAP No clinical bronchospasm  Paroxysmal atrial fibrillation/secondary hypercoagulable state Remains in sinus rhythm on telemetry, telemetry discontinued.  Continue amiodarone Continue Eliquis Follows with Dr. Herbie Baltimore.  Also follows with [name] for PAD.  CAD/PAD/lower extremity venous stasis ulcers S/p CEA, cardiac and peripheral artery stents. Recently taken off Plavix and started on Eliquis.  Continue statins. Follows with outpatient wound care.  Chronic diastolic CHF TTE 08/8467: LVEF 62-95% Diuretics on hold due to AKI May be slightly volume overloaded/left leg edema but no  respiratory symptoms.  Oral Lasix resumed.  Prolonged QTc: EKG on admission 11/15 showed QTc of 562 ms.?  Accuracy based on BBB morphology. Repeat EKG 11/15: QTc 460.  Potassium is 4.  Mag 2.1 Avoid QT prolonging medications Resolved  Anxiety and depression Continue home Abilify and Cymbalta  Ambulatory dysfunction/frequent falls/?  Orthostatic hypotension This was reported by patient and his spouse. Unable to do full orthostatics today Used to use lower extremity compression stockings but those caused him to have leg ulcers Counseled extensively regarding measures to minimize orthostatic symptoms. They claim that providers have made multiple medication adjustment for same but did not see any mention regarding orthostatic hypotension on recent cardiology office visit note from 10/29. Therapies recommend SNF.  Vitamin D insufficiency Initiated vitamin D 1000 units daily. Recommend repeating vitamin D levels in 3 months and if not improved then consider higher dose.  Body mass index is 41.83 kg/m./Morbid obesity Patient on Mounjaro as outpatient in efforts to lose weight.  Nutritional Status Nutrition Problem: Altered nutrition lab value Etiology: chronic illness, other (see comment) (meal pattern at home likely not beneficial for glycemic control) Signs/Symptoms: other (comment) (BG>400) Interventions: MVI, Snacks  Pressure Ulcer:    DVT prophylaxis: SCDs Start: 01/02/23 0811.  Eliquis   Code Status: Full Code:  Family Communication: Spouse via phone Disposition:  Status is: Inpatient Should be medically stable for DC to SNF pending review of BMP and improved creatinine.  TOC consulted.     Consultants:   Orthopedics Nephrology  Procedures:   As noted above  Antimicrobials:   IV ceftriaxone  Subjective:  Denies complaints.  No chest pain, dyspnea or right lower extremity pain.  Objective:   Vitals:   01/05/23 2347 01/06/23 0423 01/06/23 0447 01/06/23  0711  BP:  (!) 158/63  (!) 157/76  Pulse: 64 62  (!) 59  Resp: 16 17    Temp:    97.8 F (36.6 C)  TempSrc:    Oral  SpO2: 99% 100%  98%  Weight:   (!) 143.8 kg   Height:        General exam: Middle-age male, moderately built and morbidly obese lying comfortably propped up in bed without distress.  Oral mucosa moist.  Still on his nasal CPAP. Respiratory system: Clear to auscultation. Respiratory effort normal. Cardiovascular system: S1 & S2 heard, RRR. No JVD, murmurs, rubs, gallops or clicks.  No leg edema.  Off of telemetry Gastrointestinal system: Abdomen is nondistended, soft and nontender. No organomegaly or masses felt. Normal bowel sounds heard. Central nervous system: Alert and oriented. No focal neurological deficits. Extremities: Symmetric 5 x 5 power.  Right leg in cast.  Shows me that he is able to wiggle his toes well.  Left dorsal foot with healing ulcer with scabbing and no acute findings.  Stable without new or acute findings. Skin: No rashes, lesions or ulcers Psychiatry: Judgement and insight appear normal. Mood & affect appropriate.     Data Reviewed:   I have personally reviewed following labs and imaging studies   CBC: Recent Labs  Lab 01/04/23 0528 01/05/23 0609 01/06/23 0817  WBC 13.7* 8.6 6.0  HGB 9.2* 8.6* 8.8*  HCT 26.6* 25.0* 26.0*  MCV 89.6 91.2 92.2  PLT 187 175 159    Basic Metabolic Panel: Recent Labs  Lab 01/02/23 0624 01/03/23 0645 01/03/23 1432 01/04/23 0528 01/05/23 0609 01/06/23 0817  NA 133* 134*  --  138 138 139  K 3.7 4.0  --  3.2* 3.3* 4.2  CL 100 99  --  101 106 107  CO2 19* 20*  --  26 24 27   GLUCOSE 300* 419*  --  138* 85 205*  BUN 40* 51*  --  62* 57* 48*  CREATININE 3.73* 4.69*  --  4.49* 3.46* 2.96*  CALCIUM 7.9* 8.1*  --  8.4* 8.3* 8.5*  MG  --   --  2.1  --   --   --   PHOS  --   --   --  4.6 4.1 2.7    Liver Function Tests: Recent Labs  Lab 01/02/23 0624 01/04/23 0528 01/05/23 0609 01/06/23 0817  AST  20  --   --   --   ALT 14  --   --   --   ALKPHOS 71  --   --   --   BILITOT 0.7  --   --   --   PROT 4.9*  --   --   --   ALBUMIN 2.6* 2.8* 2.6* 2.5*    CBG: Recent Labs  Lab 01/05/23 2131 01/06/23 0713 01/06/23 1112  GLUCAP 236* 177* 215*    Microbiology Studies:   Recent Results (from the past 240 hour(s))  Surgical pcr screen     Status: Abnormal   Collection Time: 01/02/23  9:31 AM   Specimen: Nasal Mucosa; Nasal Swab  Result Value Ref Range Status   MRSA, PCR POSITIVE (A) NEGATIVE Final    Comment: RESULT CALLED TO, READ BACK BY AND VERIFIED WITH: [place] M[place]DA ON 11424 @1210H  BY SM  Staphylococcus aureus POSITIVE (A) NEGATIVE Final    Comment: (NOTE) The Xpert SA Assay (FDA approved for NASAL specimens in patients 2 years of age and older), is one component of a comprehensive surveillance program. It is not intended to diagnose infection nor to guide or monitor treatment. Performed at [facility], [address], [place], [place] [address]     Radiology Studies:  No results found.  Scheduled Meds:    allopurinol  100 mg Oral Daily   amiodarone  200 mg Oral Daily   apixaban  5 mg Oral BID   ARIPiprazole  5 mg Oral Daily   carvedilol  3.125 mg Oral BID WC   cholecalciferol  2,000 Units Oral BID   DULoxetine  60 mg Oral Daily   ezetimibe  10 mg Oral Daily   furosemide  80 mg Oral BID   gabapentin  300 mg Oral BID   insulin aspart  0-15 Units Subcutaneous TID WC   insulin glargine-yfgn  50 Units Subcutaneous Daily   insulin regular human CONCENTRATED  50 Units Subcutaneous BID WC   levothyroxine  88 mcg Oral QAC breakfast   multivitamin with minerals  1 tablet Oral Daily   pantoprazole  40 mg Oral Daily   rosuvastatin  10 mg Oral QPM   sodium chloride flush  3 mL Intravenous Q12H    Continuous Infusions:       LOS: 4 days     Marcellus Scott, MD,  FACP, [facility], [facility], [facility]   [facility]      To contact the attending provider between 7A-7P or the covering provider during after hours 7P-7A, please log into the web site [email or web address] and access using universal [place] password for that web site. If you do not have the password, please call the hospital operator.  01/06/2023, 2:50 PM

## 2023-01-06 NOTE — Plan of Care (Signed)
  Problem: Coping: Goal: Ability to adjust to condition or change in health will improve Outcome: Progressing   Problem: Fluid Volume: Goal: Ability to maintain a balanced intake and output will improve Outcome: Progressing   Problem: Health Behavior/Discharge Planning: Goal: Ability to identify and utilize available resources and services will improve Outcome: Progressing Goal: Ability to manage health-related needs will improve Outcome: Progressing   Problem: Metabolic: Goal: Ability to maintain appropriate glucose levels will improve Outcome: Progressing   Problem: Nutritional: Goal: Maintenance of adequate nutrition will improve Outcome: Progressing

## 2023-01-06 NOTE — Progress Notes (Signed)
Physical Therapy Treatment Patient Details Name: Harold [name] MRN: [id number] DOB: 12/30/1955 Today's Date: 01/06/2023   History of Present Illness Pt is a 67 y/o male presenting on 11/14 after a fall. Found with open R ankle fx, s/p I&D, ORIF L ankle. Significant PMH: HTN, HLD, CAD s/p PCI, COPD, DM2, CKD stage IIIb, PVD, gout, OSA.    PT Comments  Pt received in bed, very motivated to mobilize more as he reports he has to be able to get out of the bed to use the toilet. Pt came to EOB with heavy use of rails with supervision. Able to scoot self to EOB with increased time. RLE there ex performed in sitting EOB. Mod A +2 needed to stand first time and then min A +2 subsequent times. Pt having difficulty taking full wt through UE's to hop on pivot L foot and needed assist sliding L heel to chair. Pt also needed assist from therapy to keep RLE NWB. Patient will benefit from intensive inpatient follow up therapy, >3 hours/day. Pt very motivated. PT will continue to follow.     If plan is discharge home, recommend the following: Two people to help with walking and/or transfers;A lot of help with bathing/dressing/bathroom   Can travel by private vehicle     No  Equipment Recommendations  Hoyer lift;Wheelchair (measurements PT);Wheelchair cushion (measurements PT)    Recommendations for Other Services       Precautions / Restrictions Precautions Precautions: Fall Restrictions Weight Bearing Restrictions: No RLE Weight Bearing: Non weight bearing     Mobility  Bed Mobility Overal bed mobility: Needs Assistance Bed Mobility: Supine to Sit     Supine to sit: Supervision, HOB elevated, Used rails     General bed mobility comments: heavy use of rails and bed elevated. Pt able to scoot to EOB with increased time but no physical assist    Transfers Overall transfer level: Needs assistance Equipment used: Rolling walker (2 wheels) Transfers: Sit to/from Stand, Bed to  chair/wheelchair/BSC Sit to Stand: Mod assist, +2 physical assistance Stand pivot transfers: Mod assist, +2 physical assistance         General transfer comment: pt needed foot under his R foot to insure no wt. Improving with sit>stand and bariatric RW given to pt for greater stability. Pt has difficulty hopping/ pivoting LLE, needed assist from therapist behind R heel . Transferred bed to recliner which was built up with bedding to make return to bed easier.    Ambulation/Gait               General Gait Details: unable to current WB status   Stairs             Wheelchair Mobility     Tilt Bed    Modified Rankin (Stroke Patients Only)       Balance Overall balance assessment: Needs assistance Sitting-balance support: Feet unsupported Sitting balance-Leahy Scale: Fair Sitting balance - Comments: supervision   Standing balance support: Bilateral upper extremity supported, During functional activity, Reliant on assistive device for balance Standing balance-Leahy Scale: Poor Standing balance comment: heavy reliance on UE support                            Cognition Arousal: Alert Behavior During Therapy: WFL for tasks assessed/performed Overall Cognitive Status: Within Functional Limits for tasks assessed  Exercises General Exercises - Lower Extremity Ankle Circles/Pumps:  (tow wiggle R foot) Long Arc Quad: AROM, Right, 10 reps, Seated Heel Slides: AROM, Left, 10 reps, Seated Hip ABduction/ADduction: AROM, Left, 10 reps, Seated Straight Leg Raises: AROM, Left, 10 reps, Seated Other Exercises Other Exercises: tricep push ups in chair 5x/ hr    General Comments General comments (skin integrity, edema, etc.): VSS throughout, denied dizziness      Pertinent Vitals/Pain Pain Assessment Pain Assessment: Faces Faces Pain Scale: Hurts little more Pain Location: R ankle Pain Descriptors  / Indicators: Discomfort, Operative site guarding Pain Intervention(s): Limited activity within patient's tolerance, Monitored during session    Home Living                          Prior Function            PT Goals (current goals can now be found in the care plan section) Acute Rehab PT Goals Patient Stated Goal: go to rehab PT Goal Formulation: With patient/family Time For Goal Achievement: 01/17/23 Potential to Achieve Goals: Good Progress towards PT goals: Progressing toward goals    Frequency    Min 1X/week      PT Plan      Co-evaluation              AM-PAC PT "6 Clicks" Mobility   Outcome Measure  Help needed turning from your back to your side while in a flat bed without using bedrails?: A Little Help needed moving from lying on your back to sitting on the side of a flat bed without using bedrails?: A Little Help needed moving to and from a bed to a chair (including a wheelchair)?: Total Help needed standing up from a chair using your arms (e.g., wheelchair or bedside chair)?: Total Help needed to walk in hospital room?: Total Help needed climbing 3-5 steps with a railing? : Total 6 Click Score: 10    End of Session Equipment Utilized During Treatment: Gait belt Activity Tolerance: Patient tolerated treatment well Patient left: with call bell/phone within reach;in chair;with chair alarm set Nurse Communication: Mobility status PT Visit Diagnosis: Other abnormalities of gait and mobility (R26.89)     Time: 1610-9604 PT Time Calculation (min) (ACUTE ONLY): 25 min  Charges:    $Gait Training: 8-22 mins $Therapeutic Exercise: 8-22 mins PT General Charges $$ ACUTE PT VISIT: 1 Visit                     Lyanne Co, PT  Acute Rehab Services Secure chat preferred Office [phone]    Harold [name] 01/06/2023, 11:32 AM

## 2023-01-06 NOTE — Discharge Instructions (Addendum)
Orthopaedic Trauma Service Discharge Instructions   General Discharge Instructions  Orthopaedic Injuries:  Right trimalleolar ankle fracture dislocation treated with open reduction internal fixation using plate, screws and pins  WEIGHT BEARING STATUS: Nonweightbearing right leg for 8 weeks  RANGE OF MOTION/ACTIVITY: No ankle motion as you are splinted.  Okay to move toes and knee is much as possible.  Activity as tolerated while maintaining weightbearing restrictions  Bone health: Labs show vitamin D deficiency.  Please take vitamin D3 5000 IUs daily  Review the following resource for additional information regarding bone health  [email or web address]  Wound Care: Do not remove splint.  Keep splint clean and dry   Diet: as you were eating previously.  Can use over the counter stool softeners and bowel preparations, such as Miralax, to help with bowel movements.  Narcotics can be constipating.  Be sure to drink plenty of fluids  PAIN MEDICATION USE AND EXPECTATIONS  You have likely been given narcotic medications to help control your pain.  After a traumatic event that results in an fracture (broken bone) with or without surgery, it is ok to use narcotic pain medications to help control one's pain.  We understand that everyone responds to pain differently and each individual patient will be evaluated on a regular basis for the continued need for narcotic medications. Ideally, narcotic medication use should last no more than 6-8 weeks (coinciding with fracture healing).   As a patient it is your responsibility as well to monitor narcotic medication use and report the amount and frequency you use these medications when you come to your office visit.   We would also advise that if you are using narcotic medications, you should take a dose prior to therapy to maximize you participation.  IF YOU ARE ON NARCOTIC MEDICATIONS IT IS NOT PERMISSIBLE TO OPERATE A MOTOR  VEHICLE (MOTORCYCLE/CAR/TRUCK/MOPED) OR HEAVY MACHINERY DO NOT MIX NARCOTICS WITH OTHER CNS (CENTRAL NERVOUS SYSTEM) DEPRESSANTS SUCH AS ALCOHOL   POST-OPERATIVE OPIOID TAPER INSTRUCTIONS: It is important to wean off of your opioid medication as soon as possible. If you do not need pain medication after your surgery it is ok to stop day one. Opioids include: Codeine, Hydrocodone(Norco, Vicodin), Oxycodone(Percocet, oxycontin) and hydromorphone amongst others.  Long term and even short term use of opiods can cause: Increased pain response Dependence Constipation Depression Respiratory depression And more.  Withdrawal symptoms can include Flu like symptoms Nausea, vomiting And more Techniques to manage these symptoms Hydrate well Eat regular healthy meals Stay active Use relaxation techniques(deep breathing, meditating, yoga) Do Not substitute Alcohol to help with tapering If you have been on opioids for less than two weeks and do not have pain than it is ok to stop all together.  Plan to wean off of opioids This plan should start within one week post op of your fracture surgery  Maintain the same interval or time between taking each dose and first decrease the dose.  Cut the total daily intake of opioids by one tablet each day Next start to increase the time between doses. The last dose that should be eliminated is the evening dose.    STOP SMOKING OR USING NICOTINE PRODUCTS!!!!  As discussed nicotine severely impairs your body's ability to heal surgical and traumatic wounds but also impairs bone healing.  Wounds and bone heal by forming microscopic blood vessels (angiogenesis) and nicotine is a vasoconstrictor (essentially, shrinks blood vessels).  Therefore, if vasoconstriction occurs to these microscopic blood vessels they essentially disappear and  are unable to deliver necessary nutrients to the healing tissue.  This is one modifiable factor that you can do to dramatically  increase your chances of healing your injury.    (This means no smoking, no nicotine gum, patches, etc)  DO NOT USE NONSTEROIDAL ANTI-INFLAMMATORY DRUGS (NSAID'S)  Using products such as Advil (ibuprofen), Aleve (naproxen), Motrin (ibuprofen) for additional pain control during fracture healing can delay and/or prevent the healing response.  If you would like to take over the counter (OTC) medication, Tylenol (acetaminophen) is ok.  However, some narcotic medications that are given for pain control contain acetaminophen as well. Therefore, you should not exceed more than 4000 mg of tylenol in a day if you do not have liver disease.  Also note that there are may OTC medicines, such as cold medicines and allergy medicines that my contain tylenol as well.  If you have any questions about medications and/or interactions please ask your doctor/PA or your pharmacist.      ICE AND ELEVATE INJURED/OPERATIVE EXTREMITY  Using ice and elevating the injured extremity above your heart can help with swelling and pain control.  Icing in a pulsatile fashion, such as 20 minutes on and 20 minutes off, can be followed.    Do not place ice directly on skin. Make sure there is a barrier between to skin and the ice pack.    Using frozen items such as frozen peas works well as the conform nicely to the are that needs to be iced.  USE AN ACE WRAP OR TED HOSE FOR SWELLING CONTROL  In addition to icing and elevation, Ace wraps or TED hose are used to help limit and resolve swelling.  It is recommended to use Ace wraps or TED hose until you are informed to stop.    When using Ace Wraps start the wrapping distally (farthest away from the body) and wrap proximally (closer to the body)   Example: If you had surgery on your leg or thing and you do not have a splint on, start the ace wrap at the toes and work your way up to the thigh        If you had surgery on your upper extremity and do not have a splint on, start the ace wrap at  your fingers and work your way up to the upper arm  IF YOU ARE IN A SPLINT OR CAST DO NOT REMOVE IT FOR ANY REASON   If your splint gets wet for any reason please contact the office immediately. You may shower in your splint or cast as long as you keep it dry.  This can be done by wrapping in a cast cover or garbage back (or similar)  Do Not stick any thing down your splint or cast such as pencils, money, or hangers to try and scratch yourself with.  If you feel itchy take benadryl as prescribed on the bottle for itching  IF YOU ARE IN A CAM BOOT (BLACK BOOT)  You may remove boot periodically. Perform daily dressing changes as noted below.  Wash the liner of the boot regularly and wear a sock when wearing the boot. It is recommended that you sleep in the boot until told otherwise    Call office for the following: Temperature greater than 101F Persistent nausea and vomiting Severe uncontrolled pain Redness, tenderness, or signs of infection (pain, swelling, redness, odor or green/yellow discharge around the site) Difficulty breathing, headache or visual disturbances Hives Persistent dizziness or light-headedness  Extreme fatigue Any other questions or concerns you may have after discharge  In an emergency, call 911 or go to an Emergency Department at a nearby hospital  HELPFUL INFORMATION  If you had a block, it will wear off between 8-24 hrs postop typically.  This is period when your pain may go from nearly zero to the pain you would have had postop without the block.  This is an abrupt transition but nothing dangerous is happening.  You may take an extra dose of narcotic when this happens.  You should wean off your narcotic medicines as soon as you are able.  Most patients will be off or using minimal narcotics before their first postop appointment.   We suggest you use the pain medication the first night prior to going to bed, in order to ease any pain when the anesthesia wears off.  You should avoid taking pain medications on an empty stomach as it will make you nauseous.  Do not drink alcoholic beverages or take illicit drugs when taking pain medications.  In most states it is against the law to drive while you are in a splint or sling.  And certainly against the law to drive while taking narcotics.  You may return to work/school in the next couple of days when you feel up to it.   Pain medication may make you constipated.  Below are a few solutions to try in this order: Decrease the amount of pain medication if you aren't having pain. Drink lots of decaffeinated fluids. Drink prune juice and/or each dried prunes  If the first 3 don't work start with additional solutions Take Colace - an over-the-counter stool softener Take Senokot - an over-the-counter laxative Take Miralax - a stronger over-the-counter laxative     CALL THE OFFICE WITH ANY QUESTIONS OR CONCERNS: [phone]   VISIT OUR WEBSITE FOR ADDITIONAL INFORMATION: [email or web address]    Additional Discharge Instructions   Please get your medications reviewed and adjusted by your Primary MD.  Please request your Primary MD to go over all Hospital Tests and Procedure/Radiological results at the follow up, please get all Hospital records sent to your Primary MD by signing hospital release before you go home.  If you had Pneumonia of Lung problems at the Hospital: Please get a 2 view Chest X ray done in approximately 4 weeks after hospital discharge or sooner if instructed by your Primary MD.  If you have Congestive Heart Failure: Please call your Cardiologist or Primary MD anytime you have any of the following symptoms:  1) 3 pound weight gain in 24 hours or 5 pounds in 1 week  2) shortness of breath, with or without a dry hacking cough  3) swelling in the hands, feet or stomach  4) if you have to sleep on extra pillows at night in order to breathe  Follow cardiac low salt diet and 1.5 lit/day  fluid restriction.  If you have diabetes Accuchecks 4 times/day, Once in AM empty stomach and then before each meal. Log in all results and show them to your primary doctor at your next visit. If any glucose reading is under 80 or above 300 call your primary MD immediately.  If you have Seizure/Convulsions/Epilepsy: Please do not drive, operate heavy machinery, participate in activities at heights or participate in high speed sports until you have seen by Primary MD or a Neurologist and advised to do so again. Per [facility] statutes, patients with seizures are not allowed to drive  until they have been seizure-free for six months.  Use caution when using heavy equipment or power tools. Avoid working on ladders or at heights. Take showers instead of baths. Ensure the water temperature is not too high on the home water heater. Do not go swimming alone. Do not lock yourself in a room alone (i.e. bathroom). When caring for infants or small children, sit down when holding, feeding, or changing them to minimize risk of injury to the child in the event you have a seizure. Maintain good sleep hygiene. Avoid alcohol.   If you had Gastrointestinal Bleeding: Please ask your Primary MD to check a complete blood count within one week of discharge or at your next visit. Your endoscopic/colonoscopic biopsies that are pending at the time of discharge, will also need to followed by your Primary MD.  Get Medicines reviewed and adjusted. Please take all your medications with you for your next visit with your Primary MD  Please request your Primary MD to go over all hospital tests and procedure/radiological results at the follow up, please ask your Primary MD to get all Hospital records sent to his/her office.  If you experience worsening of your admission symptoms, develop shortness of breath, life threatening emergency, suicidal or homicidal thoughts you must seek medical attention immediately by calling  911 or calling your MD immediately  if symptoms less severe.  You must read complete instructions/literature along with all the possible adverse reactions/side effects for all the Medicines you take and that have been prescribed to you. Take any new Medicines after you have completely understood and accpet all the possible adverse reactions/side effects.   Do not drive or operate heavy machinery when taking Pain medications.   Do not take more than prescribed Pain, Sleep and Anxiety Medications  Special Instructions: If you have smoked or chewed Tobacco  in the last 2 yrs please stop smoking, stop any regular Alcohol  and or any Recreational drug use.  Wear Seat belts while driving.  Please note You were cared for by a hospitalist during your hospital stay. If you have any questions about your discharge medications or the care you received while you were in the hospital after you are discharged, you can call the unit and asked to speak with the hospitalist on call if the hospitalist that took care of you is not available. Once you are discharged, your primary care physician will handle any further medical issues. Please note that NO REFILLS for any discharge medications will be authorized once you are discharged, as it is imperative that you return to your primary care physician (or establish a relationship with a primary care physician if you do not have one) for your aftercare needs so that they can reassess your need for medications and monitor your lab values.  You can reach the hospitalist office at phone [phone] or fax [phone]   If you do not have a primary care physician, you can call [phone] for a physician referral.

## 2023-01-06 NOTE — Progress Notes (Signed)
Inpatient Rehab Admissions Coordinator:   Per therapy recommendations, patient was screened for CIR candidacy by Bethann Qualley, MS, CCC-SLP. At this time, Pt. does not appear to demonstrate medical necessity to justify in hospital rehabilitation/CIR. will not pursue a rehab consult for this Pt.   Recommend other rehab venues to be pursued.  Please contact me with any questions.  Kerrilyn Azbill, MS, CCC-SLP Rehab Admissions Coordinator  [phone] (celll) [phone] (office)   

## 2023-01-06 NOTE — Plan of Care (Signed)
  Problem: Coping: Goal: Ability to adjust to condition or change in health will improve Outcome: Progressing   Problem: Fluid Volume: Goal: Ability to maintain a balanced intake and output will improve Outcome: Progressing   Problem: Metabolic: Goal: Ability to maintain appropriate glucose levels will improve Outcome: Progressing   Problem: Nutritional: Goal: Maintenance of adequate nutrition will improve Outcome: Progressing Goal: Progress toward achieving an optimal weight will improve Outcome: Progressing   Problem: Skin Integrity: Goal: Risk for impaired skin integrity will decrease Outcome: Progressing   Problem: Tissue Perfusion: Goal: Adequacy of tissue perfusion will improve Outcome: Progressing

## 2023-01-06 NOTE — TOC Initial Note (Addendum)
Transition of Care [facility]) - Initial/Assessment Note    Patient Details  Name: Harold [name] MRN: [id number] Date of Birth: August 05, 1955  Transition of Care [facility]) CM/SW Contact:    Lorri Frederick, LCSW Phone Number: 01/06/2023, 10:06 AM  Clinical Narrative:      CSW met with pt regarding PT recommendation for SNF.  Pt reporting he would like to see if he is candidate for CIR as his first choice.  If he is not, he is open to SNF.  Pt from home with husband.  No current services.  Discussed pursuing CIR and SNF at the same time and pt agreeable to this, permission given to send out referral in hub.  CSW reached out to CIR to review and also sent out referral in the hub for SNF.     1530: CIR cannot move forward.  SNF bed offers provided to pt on medicare choice document.  He will review and make selection after talking to his husband.            Expected Discharge Plan: Skilled Nursing Facility Barriers to Discharge: SNF Pending bed offer   Patient Goals and CMS Choice Patient states their goals for this hospitalization and ongoing recovery are:: walk, be independent          Expected Discharge Plan and Services In-house Referral: Clinical Social Work   Post Acute Care Choice: IP Rehab Living arrangements for the past 2 months: Single Family Home                                      Prior Living Arrangements/Services Living arrangements for the past 2 months: Single Family Home Lives with:: Spouse Patient language and need for interpreter reviewed:: Yes Do you feel safe going back to the place where you live?: Yes      Need for Family Participation in Patient Care: No (Comment) Care giver support system in place?: Yes (comment) Current home services: Other (comment) (none) Criminal Activity/Legal Involvement Pertinent to Current Situation/Hospitalization: No - Comment as needed  Activities of Daily Living   ADL Screening (condition at time of  admission) Independently performs ADLs?: Yes (appropriate for developmental age) Is the patient deaf or have difficulty hearing?: No Does the patient have difficulty seeing, even when wearing glasses/contacts?: No Does the patient have difficulty concentrating, remembering, or making decisions?: Yes  Permission Sought/Granted                  Emotional Assessment Appearance:: Appears stated age Attitude/Demeanor/Rapport: Engaged Affect (typically observed): Appropriate, Pleasant Orientation: : Oriented to Self, Oriented to Place, Oriented to  Time, Oriented to Situation      Admission diagnosis:  Open ankle fracture [S82.899B] Open right ankle fracture, type III, initial encounter [S82.891C] Patient Active Problem List   Diagnosis Date Noted   Open ankle fracture 01/02/2023   Prolonged QT interval 01/02/2023   Fall at home, initial encounter 01/02/2023   Leukocytosis 01/02/2023   Acute kidney injury superimposed on chronic kidney disease (HCC) 01/02/2023   Normocytic anemia 01/02/2023   Paroxysmal atrial fibrillation (HCC) 01/02/2023   Uncontrolled type 2 diabetes mellitus with hyperglycemia, with long-term current use of insulin (HCC) 01/02/2023   Hypothyroidism 01/02/2023   CKD stage 3b, GFR 30-44 ml/min (HCC) 11/07/2022   CAD in native artery 11/07/2022   PVD (peripheral vascular disease) (HCC) 11/06/2022   Hypercoagulable state due to persistent atrial  fibrillation (HCC) 02/19/2022   Persistent atrial fibrillation (HCC) 01/28/2022   Lumbar stenosis with neurogenic claudication 07/26/2020   Diabetic neuropathy (HCC) 08/24/2019   Contusion of right elbow 09/02/2018   Trigger thumb of left hand 09/02/2018   Atherosclerosis of native artery of both lower extremities with intermittent claudication (HCC) 05/12/2018   Pain of left hand 03/26/2018   Trigger finger 03/03/2017   Pain in finger of left hand 03/03/2017   Snoring 08/20/2016   Morbid (severe) obesity due to  excess calories (HCC) 04/18/2016   Venous stasis of both lower extremities - with edema 02/25/2015   Right-sided chest wall pain 05/08/2014   Gastroesophageal reflux disease without esophagitis 10/10/2013   Chronic heart failure with preserved ejection fraction (HFpEF) (HCC) 06/27/2013   COPD (chronic obstructive pulmonary disease) (HCC) 06/17/2013   Restrictive lung disease 06/17/2013   Myalgia and myositis 04/20/2013   Aftercare following surgery of the circulatory system, NEC 02/22/2013   Obesity, Class III, BMI 40-49.9 (morbid obesity) (HCC) 09/01/2012   Fatigue 09/01/2012   Preoperative cardiovascular examination 09/01/2012   Chronic renal insufficiency, stage II (mild) 04/30/2012   OSA on CPAP 04/29/2012   Pulmonary hypertension, 04/29/2012   Dyspnea on exertion   04/26/2012   CAD, LAD/CFX DES 04/28/12- staged RCA DES 04/29/12 after pt declined CABG, cath 05/14/13 stable CAD medical therapy    Carotid artery stenosis without cerebral infarction, bilateral 11/07/2011   Diabetes mellitus type 2 with neurological manifestations (HCC) 04/29/2011   Essential hypertension 04/29/2011   Neuropathy 04/29/2011   Hyperlipidemia associated with type 2 diabetes mellitus (HCC) 04/29/2011   Anxiety and depression 04/29/2011   PCP:  [name], MD Pharmacy:   [facility] [phone] - [address], [place] - [address] [place] [address] Phone: [phone] Fax: [phone]     Social Determinants of Health (SDOH) Social History: SDOH Screenings   Food Insecurity: No Food Insecurity (01/03/2023)  Housing: Low Risk  (01/03/2023)  Transportation Needs: No Transportation Needs (01/03/2023)  Utilities: Not At Risk (01/03/2023)  Tobacco Use: Medium Risk (01/02/2023)   SDOH Interventions:     Readmission Risk Interventions     No data to display

## 2023-01-06 NOTE — Care Management Important Message (Signed)
Important Message  Patient Details  Name: Harold [name] MRN: [id number] Date of Birth: [birth year]   Important Message Given:  Yes - Medicare IM     Sherilyn Banker 01/06/2023, 2:23 PM

## 2023-01-07 ENCOUNTER — Ambulatory Visit: Payer: Medicare Other | Admitting: Adult Health

## 2023-01-07 ENCOUNTER — Ambulatory Visit: Payer: Medicare Other | Admitting: Student

## 2023-01-07 DIAGNOSIS — E039 Hypothyroidism, unspecified: Secondary | ICD-10-CM | POA: Diagnosis not present

## 2023-01-07 DIAGNOSIS — S86999A Other injury of unspecified muscle(s) and tendon(s) at lower leg level, unspecified leg, initial encounter: Secondary | ICD-10-CM | POA: Diagnosis not present

## 2023-01-07 DIAGNOSIS — Z9181 History of falling: Secondary | ICD-10-CM | POA: Diagnosis not present

## 2023-01-07 DIAGNOSIS — Z6841 Body Mass Index (BMI) 40.0 and over, adult: Secondary | ICD-10-CM | POA: Diagnosis not present

## 2023-01-07 DIAGNOSIS — I4891 Unspecified atrial fibrillation: Secondary | ICD-10-CM | POA: Diagnosis not present

## 2023-01-07 DIAGNOSIS — I480 Paroxysmal atrial fibrillation: Secondary | ICD-10-CM | POA: Diagnosis not present

## 2023-01-07 DIAGNOSIS — R6889 Other general symptoms and signs: Secondary | ICD-10-CM | POA: Diagnosis not present

## 2023-01-07 DIAGNOSIS — Z743 Need for continuous supervision: Secondary | ICD-10-CM | POA: Diagnosis not present

## 2023-01-07 DIAGNOSIS — E1169 Type 2 diabetes mellitus with other specified complication: Secondary | ICD-10-CM | POA: Diagnosis not present

## 2023-01-07 DIAGNOSIS — N189 Chronic kidney disease, unspecified: Secondary | ICD-10-CM | POA: Diagnosis not present

## 2023-01-07 DIAGNOSIS — T383X5A Adverse effect of insulin and oral hypoglycemic [antidiabetic] drugs, initial encounter: Secondary | ICD-10-CM | POA: Diagnosis not present

## 2023-01-07 DIAGNOSIS — D62 Acute posthemorrhagic anemia: Secondary | ICD-10-CM | POA: Diagnosis not present

## 2023-01-07 DIAGNOSIS — G9341 Metabolic encephalopathy: Secondary | ICD-10-CM | POA: Diagnosis not present

## 2023-01-07 DIAGNOSIS — Z7901 Long term (current) use of anticoagulants: Secondary | ICD-10-CM | POA: Diagnosis not present

## 2023-01-07 DIAGNOSIS — I272 Pulmonary hypertension, unspecified: Secondary | ICD-10-CM | POA: Diagnosis not present

## 2023-01-07 DIAGNOSIS — E1142 Type 2 diabetes mellitus with diabetic polyneuropathy: Secondary | ICD-10-CM | POA: Diagnosis not present

## 2023-01-07 DIAGNOSIS — I951 Orthostatic hypotension: Secondary | ICD-10-CM | POA: Diagnosis not present

## 2023-01-07 DIAGNOSIS — R9431 Abnormal electrocardiogram [ECG] [EKG]: Secondary | ICD-10-CM | POA: Diagnosis not present

## 2023-01-07 DIAGNOSIS — E1165 Type 2 diabetes mellitus with hyperglycemia: Secondary | ICD-10-CM | POA: Diagnosis not present

## 2023-01-07 DIAGNOSIS — S92415A Nondisplaced fracture of proximal phalanx of left great toe, initial encounter for closed fracture: Secondary | ICD-10-CM | POA: Diagnosis not present

## 2023-01-07 DIAGNOSIS — E161 Other hypoglycemia: Secondary | ICD-10-CM | POA: Diagnosis not present

## 2023-01-07 DIAGNOSIS — S82891E Other fracture of right lower leg, subsequent encounter for open fracture type I or II with routine healing: Secondary | ICD-10-CM | POA: Diagnosis not present

## 2023-01-07 DIAGNOSIS — S82851E Displaced trimalleolar fracture of right lower leg, subsequent encounter for open fracture type I or II with routine healing: Secondary | ICD-10-CM | POA: Diagnosis not present

## 2023-01-07 DIAGNOSIS — E1122 Type 2 diabetes mellitus with diabetic chronic kidney disease: Secondary | ICD-10-CM | POA: Diagnosis not present

## 2023-01-07 DIAGNOSIS — R278 Other lack of coordination: Secondary | ICD-10-CM | POA: Diagnosis not present

## 2023-01-07 DIAGNOSIS — I130 Hypertensive heart and chronic kidney disease with heart failure and stage 1 through stage 4 chronic kidney disease, or unspecified chronic kidney disease: Secondary | ICD-10-CM | POA: Diagnosis not present

## 2023-01-07 DIAGNOSIS — R2689 Other abnormalities of gait and mobility: Secondary | ICD-10-CM | POA: Diagnosis not present

## 2023-01-07 DIAGNOSIS — Y92129 Unspecified place in nursing home as the place of occurrence of the external cause: Secondary | ICD-10-CM | POA: Diagnosis not present

## 2023-01-07 DIAGNOSIS — N1832 Chronic kidney disease, stage 3b: Secondary | ICD-10-CM | POA: Diagnosis not present

## 2023-01-07 DIAGNOSIS — J449 Chronic obstructive pulmonary disease, unspecified: Secondary | ICD-10-CM | POA: Diagnosis not present

## 2023-01-07 DIAGNOSIS — W06XXXA Fall from bed, initial encounter: Secondary | ICD-10-CM | POA: Diagnosis present

## 2023-01-07 DIAGNOSIS — E785 Hyperlipidemia, unspecified: Secondary | ICD-10-CM | POA: Diagnosis not present

## 2023-01-07 DIAGNOSIS — S82899D Other fracture of unspecified lower leg, subsequent encounter for closed fracture with routine healing: Secondary | ICD-10-CM | POA: Diagnosis not present

## 2023-01-07 DIAGNOSIS — N179 Acute kidney failure, unspecified: Secondary | ICD-10-CM | POA: Diagnosis not present

## 2023-01-07 DIAGNOSIS — E11649 Type 2 diabetes mellitus with hypoglycemia without coma: Secondary | ICD-10-CM | POA: Diagnosis not present

## 2023-01-07 DIAGNOSIS — E160 Drug-induced hypoglycemia without coma: Secondary | ICD-10-CM | POA: Diagnosis not present

## 2023-01-07 DIAGNOSIS — I5042 Chronic combined systolic (congestive) and diastolic (congestive) heart failure: Secondary | ICD-10-CM | POA: Diagnosis not present

## 2023-01-07 DIAGNOSIS — Z794 Long term (current) use of insulin: Secondary | ICD-10-CM | POA: Diagnosis not present

## 2023-01-07 DIAGNOSIS — I83009 Varicose veins of unspecified lower extremity with ulcer of unspecified site: Secondary | ICD-10-CM | POA: Diagnosis not present

## 2023-01-07 DIAGNOSIS — I5032 Chronic diastolic (congestive) heart failure: Secondary | ICD-10-CM | POA: Diagnosis not present

## 2023-01-07 DIAGNOSIS — M6281 Muscle weakness (generalized): Secondary | ICD-10-CM | POA: Diagnosis not present

## 2023-01-07 DIAGNOSIS — S0990XA Unspecified injury of head, initial encounter: Secondary | ICD-10-CM | POA: Diagnosis not present

## 2023-01-07 DIAGNOSIS — Z23 Encounter for immunization: Secondary | ICD-10-CM | POA: Diagnosis not present

## 2023-01-07 DIAGNOSIS — S8291XB Unspecified fracture of right lower leg, initial encounter for open fracture type I or II: Secondary | ICD-10-CM | POA: Diagnosis not present

## 2023-01-07 DIAGNOSIS — E559 Vitamin D deficiency, unspecified: Secondary | ICD-10-CM | POA: Diagnosis present

## 2023-01-07 DIAGNOSIS — E162 Hypoglycemia, unspecified: Secondary | ICD-10-CM | POA: Diagnosis present

## 2023-01-07 DIAGNOSIS — R404 Transient alteration of awareness: Secondary | ICD-10-CM | POA: Diagnosis not present

## 2023-01-07 DIAGNOSIS — Z95820 Peripheral vascular angioplasty status with implants and grafts: Secondary | ICD-10-CM | POA: Diagnosis not present

## 2023-01-07 DIAGNOSIS — G4733 Obstructive sleep apnea (adult) (pediatric): Secondary | ICD-10-CM | POA: Diagnosis not present

## 2023-01-07 DIAGNOSIS — S82851B Displaced trimalleolar fracture of right lower leg, initial encounter for open fracture type I or II: Secondary | ICD-10-CM | POA: Diagnosis not present

## 2023-01-07 DIAGNOSIS — I499 Cardiac arrhythmia, unspecified: Secondary | ICD-10-CM | POA: Diagnosis not present

## 2023-01-07 DIAGNOSIS — Y92009 Unspecified place in unspecified non-institutional (private) residence as the place of occurrence of the external cause: Secondary | ICD-10-CM | POA: Diagnosis not present

## 2023-01-07 DIAGNOSIS — F32A Depression, unspecified: Secondary | ICD-10-CM | POA: Diagnosis present

## 2023-01-07 DIAGNOSIS — E1151 Type 2 diabetes mellitus with diabetic peripheral angiopathy without gangrene: Secondary | ICD-10-CM | POA: Diagnosis not present

## 2023-01-07 LAB — CBC
HCT: 27.6 % — ABNORMAL LOW (ref 39.0–52.0)
Hemoglobin: 9.5 g/dL — ABNORMAL LOW (ref 13.0–17.0)
MCH: 31.4 pg (ref 26.0–34.0)
MCHC: 34.4 g/dL (ref 30.0–36.0)
MCV: 91.1 fL (ref 80.0–100.0)
Platelets: 194 10*3/uL (ref 150–400)
RBC: 3.03 MIL/uL — ABNORMAL LOW (ref 4.22–5.81)
RDW: 14.5 % (ref 11.5–15.5)
WBC: 8 10*3/uL (ref 4.0–10.5)
nRBC: 0 % (ref 0.0–0.2)

## 2023-01-07 LAB — BASIC METABOLIC PANEL
Anion gap: 8 (ref 5–15)
BUN: 41 mg/dL — ABNORMAL HIGH (ref 8–23)
CO2: 28 mmol/L (ref 22–32)
Calcium: 8.6 mg/dL — ABNORMAL LOW (ref 8.9–10.3)
Chloride: 104 mmol/L (ref 98–111)
Creatinine, Ser: 2.39 mg/dL — ABNORMAL HIGH (ref 0.61–1.24)
GFR, Estimated: 29 mL/min — ABNORMAL LOW (ref >60)
Glucose, Bld: 115 mg/dL — ABNORMAL HIGH (ref 70–99)
Potassium: 3.6 mmol/L (ref 3.5–5.1)
Sodium: 140 mmol/L (ref 135–145)

## 2023-01-07 LAB — GLUCOSE, CAPILLARY
Glucose-Capillary: 78 mg/dL (ref 70–99)
Glucose-Capillary: 158 mg/dL — ABNORMAL HIGH (ref 70–99)

## 2023-01-07 MED ORDER — GABAPENTIN 300 MG PO CAPS: 300.0000 mg | ORAL_CAPSULE | Freq: Two times a day (BID) | ORAL | Status: DC

## 2023-01-07 MED ORDER — ACETAMINOPHEN 500 MG PO TABS: 500.0000 mg | ORAL_TABLET | Freq: Four times a day (QID) | ORAL | Status: DC | PRN

## 2023-01-07 MED ORDER — ADULT MULTIVITAMIN W/MINERALS CH: 1.0000 | ORAL_TABLET | Freq: Every day | ORAL | Status: DC

## 2023-01-07 MED ORDER — TRESIBA FLEXTOUCH 200 UNIT/ML ~~LOC~~ SOPN: 50.0000 [IU] | PEN_INJECTOR | Freq: Every day | SUBCUTANEOUS | Status: DC

## 2023-01-07 MED ORDER — ALLOPURINOL 100 MG PO TABS: 100.0000 mg | ORAL_TABLET | Freq: Every day | ORAL | Status: DC

## 2023-01-07 MED ORDER — HUMULIN R U-500 KWIKPEN 500 UNIT/ML ~~LOC~~ SOPN: 50.0000 [IU] | PEN_INJECTOR | SUBCUTANEOUS | Status: DC

## 2023-01-07 NOTE — Progress Notes (Signed)
Orthopaedic Trauma [facility] Progress Note  Patient ID: Harold [name] MRN: [id number] DOB/AGE: 1955-11-17 67 y.o.  Subjective:  Ortho issues stable  Likely going to snf today    ROS As above  Objective:   VITALS:   Vitals:   01/07/23 0458 01/07/23 0501 01/07/23 0514 01/07/23 0730  BP: (!) 171/86 (!) 183/75 (!) 145/54 (!) 155/54  Pulse: 66 64 66 66  Resp: 19   18  Temp: 98 F (36.7 C)   97.9 F (36.6 C)  TempSrc: Oral   Oral  SpO2: 100% 100%  100%  Weight:      Height:        Estimated body mass index is 41.94 kg/m as calculated from the following:   Height as of this encounter: 6\' 1"  (1.854 m).   Weight as of this encounter: 144.2 kg.   Intake/Output      11/18 0701 11/19 0700 11/19 0701 11/20 0700   P.O.     Total Intake(mL/kg)     Urine (mL/kg/hr) 600 (0.2)    Total Output 600    Net -600         Urine Occurrence 1 x 1 x     LABS  Results for orders placed or performed during the hospital encounter of 01/02/23 (from the past 24 hour(s))  Glucose, capillary     Status: Abnormal   Collection Time: 01/06/23  4:25 PM  Result Value Ref Range   Glucose-Capillary 143 (H) 70 - 99 mg/dL  Glucose, capillary     Status: Abnormal   Collection Time: 01/06/23  8:21 PM  Result Value Ref Range   Glucose-Capillary 102 (H) 70 - 99 mg/dL  CBC     Status: Abnormal   Collection Time: 01/07/23  7:37 AM  Result Value Ref Range   WBC 8.0 4.0 - 10.5 K/uL   RBC 3.03 (L) 4.22 - 5.81 MIL/uL   Hemoglobin 9.5 (L) 13.0 - 17.0 g/dL   HCT 53.6 (L) 64.4 - 03.4 %   MCV 91.1 80.0 - 100.0 fL   MCH 31.4 26.0 - 34.0 pg   MCHC 34.4 30.0 - 36.0 g/dL   RDW 74.2 59.5 - 63.8 %   Platelets 194 150 - 400 K/uL   nRBC 0.0 0.0 - 0.2 %  Basic metabolic panel     Status: Abnormal   Collection Time: 01/07/23  7:37 AM  Result Value Ref Range   Sodium 140 135 - 145 mmol/L   Potassium 3.6 3.5 - 5.1 mmol/L    Chloride 104 98 - 111 mmol/L   CO2 28 22 - 32 mmol/L   Glucose, Bld 115 (H) 70 - 99 mg/dL   BUN 41 (H) 8 - 23 mg/dL   Creatinine, Ser 7.56 (H) 0.61 - 1.24 mg/dL   Calcium 8.6 (L) 8.9 - 10.3 mg/dL   GFR, Estimated 29 (L) >60 mL/min   Anion gap 8 5 - 15  Glucose, capillary     Status: None   Collection Time: 01/07/23  7:48 AM  Result Value Ref Range   Glucose-Capillary 78 70 - 99 mg/dL  Glucose, capillary     Status: Abnormal   Collection Time: 01/07/23 11:50 AM  Result Value Ref Range   Glucose-Capillary 158 (H) 70 - 99 mg/dL   *Note: Due to a  large number of results and/or encounters for the requested time period, some results have not been displayed. A complete set of results can be found in Results Review.     PHYSICAL EXAM:   Gen: resting comfortably in chair, NAD, looks good  Lungs: unlabored Ext:       Right Lower Extremity Splint fitting well, splint is well padded  Dressing is clean, dry and intact             Extremity is warm             No DCT             Compartments are soft             No pain out of proportion with passive stretching of his toes or ankle             + DP pulse    Assessment/Plan: 5 Days Post-Op   Principal Problem:   Open ankle fracture Active Problems:   Essential hypertension   Anxiety and depression   CAD, LAD/CFX DES 04/28/12- staged RCA DES 04/29/12 after pt declined CABG, cath 05/14/13 stable CAD medical therapy   OSA on CPAP   Obesity, Class III, BMI 40-49.9 (morbid obesity) (HCC)   COPD (chronic obstructive pulmonary disease) (HCC)   Chronic heart failure with preserved ejection fraction (HFpEF) (HCC)   PVD (peripheral vascular disease) (HCC)   Prolonged QT interval   Fall at home, initial encounter   Leukocytosis   Acute kidney injury superimposed on chronic kidney disease (HCC)   Normocytic anemia   Paroxysmal atrial fibrillation (HCC)   Uncontrolled type 2 diabetes mellitus with hyperglycemia, with long-term current use of  insulin (HCC)   Hypothyroidism   Anti-infectives (From admission, onward)    Start     Dose/Rate Route Frequency Ordered Stop   01/02/23 1700  cefTRIAXone (ROCEPHIN) 2 g in sodium chloride 0.9 % 100 mL IVPB        2 g 200 mL/hr over 30 Minutes Intravenous Every 24 hours 01/02/23 1559 01/04/23 1751   01/02/23 1219  vancomycin (VANCOCIN) powder  Status:  Discontinued          As needed 01/02/23 1219 01/02/23 1442   01/02/23 1000  ceFAZolin (ANCEF) IVPB 2g/100 mL premix  Status:  Discontinued        2 g 200 mL/hr over 30 Minutes Intravenous Every 12 hours 01/02/23 0835 01/02/23 1628   01/02/23 1000  ceFAZolin (ANCEF) IVPB 3g/100 mL premix        3 g 200 mL/hr over 30 Minutes Intravenous On call to O.R. 01/02/23 0909 01/02/23 1158   01/02/23 0615  ceFAZolin (ANCEF) IVPB 2g/100 mL premix        2 g 200 mL/hr over 30 Minutes Intravenous  Once 01/02/23 0607 01/02/23 0646     .  POD/HD#: 45  67 y/o male with complex medical history s/p fall with open right ankle fracture dislocation   - ground level fall   -Open right trimalleolar ankle fracture dislocation including syndesmosis s/p I&D and ORIF of fibula, medial malleolus, posterior malleolus and syndesmosis Weightbearing Nonweightbearing right leg for 8 weeks Mobilize with walker               ROM/Activity                         No ankle motion.  Total knee motion as tolerated  Slowly increase activity level while maintaining weightbearing restrictions noted above               Wound care                         Patient will remain in his splint for 2 weeks and then will be converted to a short leg cast as he has 2 tibial talocalcaneal pins in place.  Will likely leave these pins in for 4 weeks and remove in the office               PT and OT      - Pain management:             Multimodal               - ABL anemia/Hemodynamics             Stable   - Medical issues              Per primary     - DVT/PE prophylaxis:             Resume home Eliquis - ID:              Rocephin for open fracture completed (72 hours)   - Metabolic Bone Disease:             Fracture is a fragility fracture             Multifactorial metabolic bone disease             Vitamin d deficiency    Supplement   - Activity:             As above   - FEN/GI prophylaxis/Foley/Lines:             Carb mod diet   -Ex-fix/Splint care:             Maintain splint until office follow-up   - Impediments to fracture healing:             Open fracture             Poorly controlled diabetes             COPD              - Dispo:             Ortho issues stable  Follow up in 10 days      Mearl Latin, PA-C [phone] (C) 01/07/2023, 12:05 PM  [facility] [address] [place] [address] [phone] [phone] (F)    After 5pm and on the weekends please log on to Amion, go to orthopaedics and the look under the [facility] Call for the provider(s) on call. You can also call our office at [phone] and then follow the prompts to be connected to the call team.  Patient ID: Harold [name], male   DOB: 1955-10-16, 67 y.o.   MRN: [id number]

## 2023-01-07 NOTE — Progress Notes (Signed)
Report called to Judeth Cornfield, LPN at [facility].

## 2023-01-07 NOTE — TOC Transition Note (Signed)
Transition of Care [facility]) - CM/SW Discharge Note   Patient Details  Name: Harold [name] MRN: [id number] Date of Birth: [birth year]  Transition of Care [facility]) CM/SW Contact:  Lorri Frederick, LCSW Phone Number: 01/07/2023, 12:56 PM   Clinical Narrative: Pt discharging to [facility].  RN call report to [phone].      Final next level of care: Skilled Nursing Facility Barriers to Discharge: Barriers Resolved   Patient Goals and CMS Choice      Discharge Placement                Patient chooses bed at: [facility] Patient to be transferred to facility by: ptar Name of family member notified: left message with husband [name] Patient and family notified of of transfer: 01/07/23  Discharge Plan and Services Additional resources added to the After Visit Summary for   In-house Referral: Clinical Social Work   Post Acute Care Choice: IP Rehab                               Social Determinants of Health (SDOH) Interventions SDOH Screenings   Food Insecurity: No Food Insecurity (01/03/2023)  Housing: Low Risk  (01/03/2023)  Transportation Needs: No Transportation Needs (01/03/2023)  Utilities: Not At Risk (01/03/2023)  Tobacco Use: Medium Risk (01/02/2023)     Readmission Risk Interventions     No data to display

## 2023-01-07 NOTE — Progress Notes (Addendum)
Physical Therapy Treatment Patient Details Name: Harold [name] MRN: [id number] DOB: [birth year] Today's Date: 01/07/2023   History of Present Illness Pt is a 67 y/o male presenting on 11/14 after a fall. Found with open R ankle fx, s/p I&D, ORIF L ankle. Significant PMH: HTN, HLD, CAD s/p PCI, COPD, DM2, CKD stage IIIb, PVD, gout, OSA.    PT Comments  Pt received sitting EOB with OT for session focused on transfer training. Pt verbalizes understanding of RLE NWB status, however demonstrates difficulty recognizing when he is WB and adhering to NWB during transfers. Pt requires therapist's foot under RLE and cues during all transfers to maintain NWB. Pt able to attempt stand pivot with improvement in LLE heel-toe pivot, however becomes fatigued requiring a seated rest break. Pt able to complete a squat pivot transfer to the recliner with mod A +2. Pt continues to benefit from PT services to progress toward functional mobility goals.    If plan is discharge home, recommend the following: Two people to help with walking and/or transfers;A lot of help with bathing/dressing/bathroom   Can travel by private vehicle     No  Equipment Recommendations  Hoyer lift;Wheelchair (measurements PT);Wheelchair cushion (measurements PT)    Recommendations for Other Services       Precautions / Restrictions Precautions Precautions: Fall Restrictions Weight Bearing Restrictions: Yes RLE Weight Bearing: Non weight bearing     Mobility  Bed Mobility               General bed mobility comments: Pt sitting EOB with OT upon entry    Transfers Overall transfer level: Needs assistance Equipment used: Rolling walker (2 wheels) Transfers: Sit to/from Stand, Bed to chair/wheelchair/BSC Sit to Stand: Mod assist, +2 physical assistance     Squat pivot transfers: Mod assist, +2 physical assistance     General transfer comment: Pt requires dense cues for RLE NWB due to pt unaware that he is WB  during standing trial. Pt requires therapist's foot under RLE and mod A +2 to maintain NWB. Pt attempts to stand pivot with cues for technique and assist for balance, however pt continuing to have difficulty with RLE NWB and becomes too fatigued before reaching the recliner and needing to sit back to the bed. Pt completing a squat pivot with mod A +2 using bedpad.        Balance Overall balance assessment: Needs assistance Sitting-balance support: Feet supported Sitting balance-Leahy Scale: Fair Sitting balance - Comments: sitting EOB   Standing balance support: Bilateral upper extremity supported, During functional activity, Reliant on assistive device for balance Standing balance-Leahy Scale: Poor Standing balance comment: heavy reliance on UE support                            Cognition Arousal: Alert Behavior During Therapy: WFL for tasks assessed/performed Overall Cognitive Status: Within Functional Limits for tasks assessed                                          Exercises      General Comments        Pertinent Vitals/Pain Pain Assessment Pain Assessment: Faces Faces Pain Scale: Hurts little more Pain Location: R ankle Pain Descriptors / Indicators: Discomfort, Operative site guarding, Burning Pain Intervention(s): Monitored during session, Repositioned     PT Goals (current goals  can now be found in the care plan section) Acute Rehab PT Goals Patient Stated Goal: go to rehab PT Goal Formulation: With patient/family Time For Goal Achievement: 01/17/23 Progress towards PT goals: Progressing toward goals    Frequency    Min 1X/week           Co-evaluation PT/OT/SLP Co-Evaluation/Treatment: Yes Reason for Co-Treatment: For patient/therapist safety;To address functional/ADL transfers PT goals addressed during session: Mobility/safety with mobility;Balance;Proper use of DME        AM-PAC PT "6 Clicks" Mobility   Outcome  Measure  Help needed turning from your back to your side while in a flat bed without using bedrails?: A Little Help needed moving from lying on your back to sitting on the side of a flat bed without using bedrails?: A Little Help needed moving to and from a bed to a chair (including a wheelchair)?: Total Help needed standing up from a chair using your arms (e.g., wheelchair or bedside chair)?: Total Help needed to walk in hospital room?: Total Help needed climbing 3-5 steps with a railing? : Total 6 Click Score: 10    End of Session Equipment Utilized During Treatment: Gait belt Activity Tolerance: Patient tolerated treatment well;Patient limited by fatigue Patient left: with call bell/phone within reach;in chair;with chair alarm set Nurse Communication: Mobility status PT Visit Diagnosis: Other abnormalities of gait and mobility (R26.89)     Time: 4782-9562 PT Time Calculation (min) (ACUTE ONLY): 21 min  Charges:    $Therapeutic Activity: 8-22 mins PT General Charges $$ ACUTE PT VISIT: 1 Visit                     Johny Shock, PTA Acute Rehabilitation Services Secure Chat Preferred  [phone]    Johny Shock 01/07/2023, 12:58 PM

## 2023-01-07 NOTE — Discharge Summary (Signed)
Physician Discharge Summary  Harold [name] UXL:[id number] DOB: [birth year]  PCP: Harold Brunette, MD  Admitted from: Home Discharged to: SNF  Admit date: 01/02/2023 Discharge date: 01/07/2023  Recommendations for Outpatient Follow-up:    Follow-up Information     Harold Galas, MD. Schedule an appointment as soon as possible for a visit in 10 day(s).   Specialty: Orthopedic Surgery Why: Postop follow-up. Contact information: [address] [place] [address] [phone]         MD at SNF. Schedule an appointment as soon as possible for a visit.   Why: To be seen in 2 to 3 days with repeat labs (CBC & BMP)        Harold Brunette, MD. Schedule an appointment as soon as possible for a visit.   Specialty: Internal Medicine Why: To be seen upon discharge from SNF. Contact information: [address] [place] [address] [phone]         Harold Miss, MD. Schedule an appointment as soon as possible for a visit.   Specialty: Nephrology Contact information: [address] [place] [address] [phone]                  Home Health: None    Equipment/Devices: TBD at SNF    Discharge Condition: Improved and stable   Code Status: Full Code Diet recommendation:  Discharge Diet Orders (From admission, onward)     Start     Ordered   01/07/23 0000  Diet - low sodium heart healthy        01/07/23 1154   01/07/23 0000  Diet Carb Modified        01/07/23 1154             Discharge Diagnoses:  Principal Problem:   Open ankle fracture Active Problems:   Fall at home, initial encounter   Leukocytosis   Acute kidney injury superimposed on chronic kidney disease (HCC)   Normocytic anemia   Paroxysmal atrial fibrillation (HCC)   Uncontrolled type 2 diabetes mellitus with hyperglycemia, with long-term current use of insulin (HCC)   Prolonged QT interval   Chronic heart failure with preserved ejection fraction  (HFpEF) (HCC)   Essential hypertension   Hypothyroidism   CAD, LAD/CFX DES 04/28/12- staged RCA DES 04/29/12 after pt declined CABG, cath 05/14/13 stable CAD medical therapy   PVD (peripheral vascular disease) (HCC)   COPD (chronic obstructive pulmonary disease) (HCC)   Anxiety and depression   OSA on CPAP   Obesity, Class III, BMI 40-49.9 (morbid obesity) (HCC)   Vitamin D deficiency   Brief Summary: 67 year old married male, independent, medical history significant for HTN, HLD, CAD s/p PCI, A-fib on Eliquis, COPD, OSA on nightly CPAP, DM2 with renal complications and peripheral neuropathy, stage IIIb CKD, PAD s/p stents, anxiety and depression, gout, tobacco abuse, orthostatic hypotension (patient reported), frequent falls, presented to the ED following fall at home x 2 followed by severe pain of his right ankle with bone sticking out and blood squirting wound.  Admitted for open right occult fracture sustained after a mechanical fall, suspected due to orthostatic hypotension, acute on chronic kidney disease.  Orthopedics consulted and underwent I&D and ORIF of right ankle fracture 11/14.  Nephrology consulted for progressive AKI, improving.     Assessment & Plan:     Right open ankle fracture: Sustained post mechanical fall after symptoms suggestive of orthostatic hypotension Orthopedics consulted and underwent I&D and ORIF of right ankle fracture  11/14.   Orthopedics follow-up appreciated: NWB RLE x 8 weeks, no ankle motion, move knee as tolerated, mobilize with walker, will remain in splint x 2 weeks then will be converted to a short leg cast.  Remains on Eliquis for DVT prophylaxis.  Vitamin D levels 15.05, replace and follow. Therapies recommend SNF.  As per CIR coordinator note, does not appear to have necessity to warrant CIR admission S/p tetanus shot. Completed 3 days of IV ceftriaxone per orthopedics. Patient discharging to SNF for short-term rehab.  Outpatient follow-up with  orthopedics.   Acute kidney injury complicating stage IIIb CKD: Follows with Dr. Marisue Humble, [facility] Baseline creatinine 1.8-2 Presented with creatinine of 3.73 and BUN of 40. Creatinine had peaked to 4.7  DD of AKI: ATN from hypotension, NSAIDs, Lasix and losartan Renal ultrasound without hydronephrosis.  Urine microscopy shows glycosuria, proteinuria >300, rare bacteria, no hematuria plan 6-10 WBCs per hpf.  Not impressive.  Urine sodium less than 10 and urine creatinine 143 Above meds were held.  Brief IV fluids on day 1 and then not continued.  Monitoring daily BMP and creatinine steadily improving Nephrology consultation and follow-up appreciated.  Observation.  Diuretics were briefly held.  She was restarted on Lasix 80 mg twice daily on 11/17 with recommendation to continue holding losartan and resume at DC if needed for hypertension. Creatinine will improved.  Down to 2.39 on day of discharge.  Would continue to hold losartan until follow-up BMP at SNF and when creatinine within his baseline range, recommend starting prior home dose of losartan with close monitoring of BMP. Colchicine and as needed NSAIDs were discontinued due to AKI.  Gabapentin dose was reduced and so also allopurinol dose.   Hypokalemia: Replaced.  Magnesium 2.1 earlier   Acute blood loss anemia: Baseline hemoglobin probably in the 13-14 range Presented with hemoglobin of 12.2 which dropped to 9.7 >9.2 >8.6 >8.8.   EBL 200 mL Hemoglobin up to 9.5 on day of discharge.   Leukocytosis: Possibly stress response but also had an open fracture Completed 3 days of IV ceftriaxone Resolved.   Type II DM/IDDM in obese with peripheral neuropathy, hyperglycemia/hypoglycemia Reports that he does not follow with endocrinology Diabetic using insulin since 1990s A1c of 7 indicates reasonable outpatient control Uses high doses of insulins including Tresiba 50 units daily, U-500, 50 units daily and 150 units at bedtime. Initiated  Evaristo Bury equivalent Semglee 50 nights daily, U-500, 50 units twice daily, NovoLog resistant SSI.  Plan to escalate insulins quickly based on CBG readings.  Included bedtime SSI. Diabetes coordinator consulted for assistance. Mostly controlled in the hospital but had a hypoglycemic event of 66 mg per DL earlier this morning. Has not required bedtime SSI over the last 2 nights, discontinued bedtime scale.  Given hypoglycemia 11/17 morning and tight glycemic control, requiring low doses of SSI during the daytime, changed resistant scale to moderate.  Monitor closely.  No further hypoglycemic episodes. At discharge to SNF, continue prior home regimen of diabetic medications with close follow-up.   Essential hypertension: Transient hypotension with EMS.  Now normotensive Continue carvedilol Holding losartan, see discussion above.  Resumed oral furosemide. Blood pressure mildly uncontrolled at times.   Hyperlipidemia Continue rosuvastatin.  CK normal.   Hypothyroid TSH: 3.387 Continue home dose of Synthroid   OSA on nightly CPAP/COPD Continue CPAP No clinical bronchospasm   Paroxysmal atrial fibrillation/secondary hypercoagulable state Remains in sinus rhythm on telemetry, telemetry discontinued.  Continue amiodarone Continue Eliquis Follows with Dr. Herbie Baltimore.  Also  follows with [name] for PAD.   CAD/PAD/lower extremity venous stasis ulcers S/p CEA, cardiac and peripheral artery stents. Recently taken off Plavix and started on Eliquis.  Continue statins. Follows with outpatient wound care.   Chronic diastolic CHF TTE 02/6107: LVEF 60-45% Diuretics were briefly held due to AKI.  Resume Lasix.  Clinically euvolemic.   Prolonged QTc: EKG on admission 11/15 showed QTc of 562 ms.?  Accuracy based on BBB morphology. Repeat EKG 11/15: QTc 460.  Potassium is 4.  Mag 2.1 Avoid QT prolonging medications Resolved   Anxiety and depression Continue home Abilify and Cymbalta   Ambulatory  dysfunction/frequent falls/?  Orthostatic hypotension This was reported by patient and his spouse. Unable to do full orthostatics today Used to use lower extremity compression stockings but those caused him to have leg ulcers Counseled extensively regarding measures to minimize orthostatic symptoms. They claim that providers have made multiple medication adjustment for same but did not see any mention regarding orthostatic hypotension on recent cardiology office visit note from 10/29. Therapies recommend SNF.   Vitamin D insufficiency Initiated vitamin D 1000 units daily.  Orthopedics of increased dose. Recommend repeating vitamin D levels in 3 months and if not improved then consider higher dose.   Body mass index is 41.83 kg/m./Morbid obesity Patient on Mounjaro as outpatient in efforts to lose weight.   Nutritional Status Nutrition Problem: Altered nutrition lab value Etiology: chronic illness, other (see comment) (meal pattern at home likely not beneficial for glycemic control) Signs/Symptoms: other (comment) (BG>400) Interventions: MVI, Snacks    Consultants:   Orthopedics Nephrology   Procedures:   As noted above  Discharge Instructions  Discharge Instructions     (HEART FAILURE PATIENTS) Call MD:  Anytime you have any of the following symptoms: 1) 3 pound weight gain in 24 hours or 5 pounds in 1 week 2) shortness of breath, with or without a dry hacking cough 3) swelling in the hands, feet or stomach 4) if you have to sleep on extra pillows at night in order to breathe.   Complete by: As directed    Call MD for:  difficulty breathing, headache or visual disturbances   Complete by: As directed    Call MD for:  extreme fatigue   Complete by: As directed    Call MD for:  persistant dizziness or light-headedness   Complete by: As directed    Call MD for:  persistant nausea and vomiting   Complete by: As directed    Call MD for:  redness, tenderness, or signs of  infection (pain, swelling, redness, odor or green/yellow discharge around incision site)   Complete by: As directed    Call MD for:  severe uncontrolled pain   Complete by: As directed    Call MD for:  temperature >100.4   Complete by: As directed    Diet - low sodium heart healthy   Complete by: As directed    Diet Carb Modified   Complete by: As directed    Discharge instructions   Complete by: As directed    As per orthopedics : NWB RLE x 8 weeks, no ankle motion, move knee as tolerated, mobilize with walker, will remain in splint x 2 weeks then will be converted to a short leg cast.  Remains on Eliquis for DVT prophylaxis.  Acetaminophen dose from all sources not to exceed 4 g/day.   Increase activity slowly   Complete by: As directed  Medication List     STOP taking these medications    Colcrys 0.6 MG tablet Generic drug: colchicine   doxycycline 100 MG capsule Commonly known as: VIBRAMYCIN   gabapentin 800 MG tablet Commonly known as: NEURONTIN Replaced by: gabapentin 300 MG capsule   ibuprofen 200 MG tablet Commonly known as: ADVIL   losartan 25 MG tablet Commonly known as: COZAAR       TAKE these medications    acetaminophen 500 MG tablet Commonly known as: TYLENOL Take 1 tablet (500 mg total) by mouth every 6 (six) hours as needed for mild pain (pain score 1-3). What changed: reasons to take this   allopurinol 100 MG tablet Commonly known as: ZYLOPRIM Take 1 tablet (100 mg total) by mouth daily. What changed:  how much to take when to take this   amiodarone 200 MG tablet Commonly known as: PACERONE Take 1 tablet (200 mg total) by mouth daily.   apixaban 5 MG Tabs tablet Commonly known as: ELIQUIS Take 1 tablet (5 mg total) by mouth 2 (two) times daily.   ARIPiprazole 5 MG tablet Commonly known as: ABILIFY Take 5 mg by mouth daily.   B-D ULTRAFINE III SHORT PEN 31G X 8 MM Misc Generic drug: Insulin Pen Needle Inject 1 each into  the skin 3 (three) times daily.   carvedilol 3.125 MG tablet Commonly known as: COREG TAKE 1 TABLET BY MOUTH TWICE A DAY WITH A MEAL   DULoxetine 60 MG capsule Commonly known as: CYMBALTA Take 60 mg by mouth daily.   ezetimibe 10 MG tablet Commonly known as: ZETIA Take 10 mg by mouth daily.   FreeStyle Libre 14 Day Sensor Misc APPLY 1 SENSOR EVERY 14 DAYS   furosemide 80 MG tablet Commonly known as: LASIX Take 1 tablet (80 mg total) by mouth 2 (two) times daily. May take additional tablet for weight gain of 3 pounds in one day or five pounds in one week   gabapentin 300 MG capsule Commonly known as: NEURONTIN Take 1 capsule (300 mg total) by mouth 2 (two) times daily. Replaces: gabapentin 800 MG tablet   HumuLIN R U-500 KwikPen 500 UNIT/ML KwikPen Generic drug: insulin regular human CONCENTRATED Inject 50-150 Units into the skin See admin instructions. Inject 50 units subcutaneously with breakfast and inject 150 units subcutaneously with dinner.   HYDROcodone-acetaminophen 5-325 MG tablet Commonly known as: NORCO/VICODIN Take 1 tablet by mouth every 6 (six) hours as needed for moderate pain (pain score 4-6).   levothyroxine 88 MCG tablet Commonly known as: SYNTHROID Take 88 mcg by mouth daily before breakfast.   meclizine 25 MG tablet Commonly known as: ANTIVERT Take 25-37.5 mg by mouth See admin instructions. Take 25mg  by mouth once in the morning, 25mg  by mouth once in the afternoon and 37.5mg  by mouth once in the evening.   Mounjaro 15 MG/0.5ML Pen Generic drug: tirzepatide Inject 15 mg into the skin once a week.   multivitamin with minerals Tabs tablet Take 1 tablet by mouth daily. Start taking on: January 08, 2023   mupirocin ointment 2 % Commonly known as: BACTROBAN Apply 1 Application topically 2 (two) times daily as needed (wound care).   nitroGLYCERIN 0.4 MG SL tablet Commonly known as: NITROSTAT PLACE 1 TAB UNDER THE TONGUE EVERY 5 MINUTES AS NEEDED  FOR CHEST PAIN (SEVERE PRESSURE OR TIGHTNESS)   pantoprazole 40 MG tablet Commonly known as: PROTONIX TAKE 1 TABLET BY MOUTH EVERY DAY   potassium chloride 10 MEQ CR capsule  Commonly known as: MICRO-K Take 10 mEq by mouth daily.   PRESCRIPTION MEDICATION Pt uses CPAP Machine at bedtime   rosuvastatin 20 MG tablet Commonly known as: CRESTOR Take 20 mg by mouth every evening.   Evaristo Bury FlexTouch 200 UNIT/ML FlexTouch Pen Generic drug: insulin degludec Inject 50 Units into the skin daily.   Vitamin D 125 MCG (5000 UT) Caps Take 1 capsule by mouth daily.       Allergies  Allergen Reactions   Septra [Bactrim] Itching   Penicillins Rash    Allergy to All cillin drugs  Ancef given 9/24 with no obvious reaction   Metformin And Related Diarrhea and Nausea Only   Other Nausea And Vomiting    General Anesthesia - sometimes causes nausea and vomiting * OK to use scopolamine patch     Sulfamethoxazole-Trimethoprim Other (See Comments)   Wellbutrin [Bupropion]     Mood Changes   Doxycycline Hives and Rash      Procedures/Studies: US RENAL  Result Date: 01/03/2023 CLINICAL DATA:  Acute renal insufficiency. EXAM: RENAL / URINARY TRACT ULTRASOUND COMPLETE COMPARISON:  None Available. FINDINGS: Right Kidney: Renal measurements: 12.8 x 8.7 x 6.8 cm = volume: 305 mL. There is moderate parenchyma atrophy and cortical thinning. Normal echogenicity. No hydronephrosis or shadowing stone. Left Kidney: Renal measurements: 10.1 x 6.8 x 6.4 cm = volume: 231 mL. Moderate parenchyma atrophy and cortical thinning. Normal echogenicity. No hydronephrosis or shadowing stone. Bladder: The urinary bladder is not visualized. Other: None. IMPRESSION: Moderately atrophic kidneys.  No hydronephrosis or shadowing stone. Electronically Signed   By: Elgie Collard M.D.   On: 01/03/2023 18:52   DG Ankle Complete Right  Result Date: 01/03/2023 CLINICAL DATA:  Fracture, postop. EXAM: RIGHT ANKLE - COMPLETE  3+ VIEW COMPARISON:  Preoperative imaging. FINDINGS: Lateral plate and screw fixation of distal fibular fracture. Two screws traverse the medial malleolar fracture. A single screw traverses the distal tibia. Two K-wires traverse the hindfoot. Fractures are in improved alignment from preoperative imaging. Overlying cast material which limits osseous and soft tissue fine detail. IMPRESSION: ORIF of distal tibia and fibula fractures and K wire fixation of the hindfoot. Electronically Signed   By: Narda Rutherford M.D.   On: 01/03/2023 11:22   DG Ankle Complete Right  Result Date: 01/02/2023 CLINICAL DATA:  Elective surgery. EXAM: RIGHT ANKLE - COMPLETE 3+ VIEW COMPARISON:  Preoperative imaging FINDINGS: Thirteen fluoroscopic spot views of the right ankle obtained in the operating room. Lateral plate and screw fixation of distal fibular fracture. Two screws traverse the medial malleolar fracture. Additional screw traverses the distal tibia. Possible K-wire fixation of the hindfoot. Fluoroscopy time 1 minutes 29 seconds. Dose 2.02 mGy. IMPRESSION: Intraoperative fluoroscopy during ankle fracture fixation. Electronically Signed   By: Narda Rutherford M.D.   On: 01/02/2023 17:34   DG C-Arm 1-60 Min-No Report  Result Date: 01/02/2023 Fluoroscopy was utilized by the requesting physician.  No radiographic interpretation.   DG C-Arm 1-60 Min-No Report  Result Date: 01/02/2023 Fluoroscopy was utilized by the requesting physician.  No radiographic interpretation.   DG C-Arm 1-60 Min-No Report  Result Date: 01/02/2023 Fluoroscopy was utilized by the requesting physician.  No radiographic interpretation.   CT ANKLE RIGHT WO CONTRAST  Result Date: 01/02/2023 CLINICAL DATA:  Right ankle injury after fall. EXAM: CT OF THE RIGHT ANKLE WITHOUT CONTRAST TECHNIQUE: Multidetector CT imaging of the right ankle was performed according to the standard protocol. Multiplanar CT image reconstructions were also  generated. RADIATION  DOSE REDUCTION: This exam was performed according to the departmental dose-optimization program which includes automated exposure control, adjustment of the mA and/or kV according to patient size and/or use of iterative reconstruction technique. COMPARISON:  Right ankle x-rays from same day. FINDINGS: Bones/Joint/Cartilage Acute oblique fracture through the base of the medial malleolus. The medial malleolar fragment is displaced up to 1.6 cm and internally rotated. Acute comminuted fracture of the distal fibular diaphysis with up to 7 mm posterior displacement of the dominant fragments. The butterfly fragment is posteriorly displaced approximately 1 cm. Acute longitudinal fracture of the posterior malleolus with 5 mm posterior displacement and 2 mm superior displacement. There are additional tiny avulsion fractures at the inferior tip of the medial malleolar fragment, and anterior aspect of the lateral malleolus. Small minimally depressed fracture of the posterior lateral talar dome (series 11, image 27). Slight lateral subluxation of the talar dome with respect to the tibial plafond. No dislocation. Mild midfoot and hindfoot degenerative changes. Prominent calcaneal peroneal tubercle. Small tibiotalar joint effusion with several foci of intra-articular air. Ligaments Ligaments are suboptimally evaluated by CT. Muscles and Tendons Grossly intact. Soft tissue Diffuse soft tissue swelling with multiple foci of subcutaneous emphysema. No fluid collection or hematoma. No soft tissue mass. IMPRESSION: 1. Acute open trimalleolar fracture as described above. 2. Small minimally depressed fracture of the posterior lateral talar dome. Electronically Signed   By: Obie Dredge M.D.   On: 01/02/2023 10:36   DG Chest Portable 1 View  Result Date: 01/02/2023 CLINICAL DATA:  Preop planning. EXAM: PORTABLE CHEST 1 VIEW COMPARISON:  AP Lat chest 07/10/2022 FINDINGS: The heart is slightly enlarged. No  vascular congestion is seen. The mediastinal configuration is normal. There is calcification of the transverse aorta. The lungs are clear. There is no substantial pleural effusion. Thoracic spondylosis with no new osseous findings. Compare: Stable chest. IMPRESSION: No evidence of acute chest disease. Slight cardiomegaly. Aortic atherosclerosis. Stable chest. Electronically Signed   By: Almira Bar M.D.   On: 01/02/2023 07:29   DG Ankle 2 Views Right  Result Date: 01/02/2023 CLINICAL DATA:  67 year old male history of open fracture of the right ankle status post reduction. EXAM: RIGHT ANKLE - 2 VIEW COMPARISON:  01/02/2023. FINDINGS: Compared to the prior examination there has been substantial improvement in alignment of the previously described fracture/dislocation of the right ankle. Comminuted displaced fracture of the distal third of the fibular diaphysis is again noted, now with predominantly 7 mm of posterior displacement of the distal fracture fragments and minimal residual angulation. The distal tibia is now associated with the talus, with some mild widening of the medial aspect of the tibiotalar joint. Osseous fragment adjacent to the medial malleolus remains displaced with some varus rotation. There also appears to be an osseous defect along the anterior aspect of the distal tibia, suggesting additional fracture. Interval placement of overlying splint material obscures finer bony detail. IMPRESSION: 1. Status post closed reduction splint fixation of previously noted fracture dislocation of the right ankle with improved alignment, as above. Electronically Signed   By: Trudie Reed M.D.   On: 01/02/2023 06:36   DG Ankle 2 Views Right  Result Date: 01/02/2023 CLINICAL DATA:  67 year old male with history of open fracture of the right ankle. EXAM: RIGHT ANKLE - 2 VIEW COMPARISON:  Right ankle radiograph 09/09/2019. FINDINGS: Two views of the right ankle demonstrates severe fracture/dislocation  of the right ankle. Specifically, there is a displaced, comminuted and angulated fracture of the distal third  of the fibular diaphysis, with proximally 30 degrees of lateral angulation. Distal fibular fracture remains associated with the talus. Complete dislocation of the tibia relative to the talus, with the distal tibia lying completely medial to the talus and slightly posterior, likely impacted upon the bone. There is an acute displaced fracture of the medial malleolus with proximally 1.2 cm of distal migration of the distal fracture fragment. Overlying soft tissues are swollen and grossly distorted. Finer bony detail is obscured by overlying drape materials. IMPRESSION: 1. Severe fracture dislocation of the right ankle joint, as detailed above. Electronically Signed   By: Trudie Reed M.D.   On: 01/02/2023 06:22      Subjective: Denies complaints.  Not much pain.  No chest pain or dyspnea.  Discharge Exam:  Vitals:   01/07/23 0458 01/07/23 0501 01/07/23 0514 01/07/23 0730  BP: (!) 171/86 (!) 183/75 (!) 145/54 (!) 155/54  Pulse: 66 64 66 66  Resp: 19   18  Temp: 98 F (36.7 C)   97.9 F (36.6 C)  TempSrc: Oral   Oral  SpO2: 100% 100%  100%  Weight:      Height:        General exam: Middle-age male, moderately built and morbidly obese lying comfortably propped up in bed without distress.  Oral mucosa moist.   Respiratory system: Clear to auscultation. Respiratory effort normal. Cardiovascular system: S1 & S2 heard, RRR. No JVD, murmurs, rubs, gallops or clicks.  No leg edema.  Off of telemetry Gastrointestinal system: Abdomen is nondistended, soft and nontender. No organomegaly or masses felt. Normal bowel sounds heard. Central nervous system: Alert and oriented. No focal neurological deficits. Extremities: Symmetric 5 x 5 power.  Right leg in cast.  Shows me that he is able to wiggle his toes well.  Left dorsal foot with healing ulcer with scabbing and no acute findings.  Stable  without new or acute findings. Skin: No rashes, lesions or ulcers Psychiatry: Judgement and insight appear normal. Mood & affect appropriate.     The results of significant diagnostics from this hospitalization (including imaging, microbiology, ancillary and laboratory) are listed below for reference.     Microbiology: Recent Results (from the past 240 hour(s))  Surgical pcr screen     Status: Abnormal   Collection Time: 01/02/23  9:31 AM   Specimen: Nasal Mucosa; Nasal Swab  Result Value Ref Range Status   MRSA, PCR POSITIVE (A) NEGATIVE Final    Comment: RESULT CALLED TO, READ BACK BY AND VERIFIED WITH: [place] [id number] @1210H  BY SM    Staphylococcus aureus POSITIVE (A) NEGATIVE Final    Comment: (NOTE) The Xpert SA Assay (FDA approved for NASAL specimens in patients 68 years of age and older), is one component of a comprehensive surveillance program. It is not intended to diagnose infection nor to guide or monitor treatment. Performed at [id number], [address], [place], [place] [address]      Labs: CBC: Recent Labs  Lab 01/03/23 0645 01/04/23 0528 01/05/23 0609 01/06/23 0817 01/07/23 0737  WBC 16.5* 13.7* 8.6 6.0 8.0  HGB 9.7* 9.2* 8.6* 8.8* 9.5*  HCT 27.6* 26.6* 25.0* 26.0* 27.6*  MCV 89.9 89.6 91.2 92.2 91.1  PLT 179 187 175 159 194    Basic Metabolic Panel: Recent Labs  Lab 01/03/23 0645 01/03/23 1432 01/04/23 0528 01/05/23 0609 01/06/23 0817 01/07/23 0737  NA 134*  --  138 138 139 140  K 4.0  --  3.2*  3.3* 4.2 3.6  CL 99  --  101 106 107 104  CO2 20*  --  26 24 27 28   GLUCOSE 419*  --  138* 85 205* 115*  BUN 51*  --  62* 57* 48* 41*  CREATININE 4.69*  --  4.49* 3.46* 2.96* 2.39*  CALCIUM 8.1*  --  8.4* 8.3* 8.5* 8.6*  MG  --  2.1  --   --   --   --   PHOS  --   --  4.6 4.1 2.7  --     Liver Function Tests: Recent Labs  Lab 01/02/23 0624 01/04/23 0528 01/05/23 0609 01/06/23 0817  AST 20  --   --   --   ALT 14  --   --    --   ALKPHOS 71  --   --   --   BILITOT 0.7  --   --   --   PROT 4.9*  --   --   --   ALBUMIN 2.6* 2.8* 2.6* 2.5*    CBG: Recent Labs  Lab 01/06/23 1112 01/06/23 1625 01/06/23 2021 01/07/23 0748 01/07/23 1150  GLUCAP 215* 143* 102* 78 158*      Urinalysis    Component Value Date/Time   COLORURINE YELLOW 01/02/2023 0812   APPEARANCEUR HAZY (A) 01/02/2023 0812   LABSPEC 1.017 01/02/2023 0812   PHURINE 5.0 01/02/2023 0812   GLUCOSEU >=500 (A) 01/02/2023 0812   HGBUR MODERATE (A) 01/02/2023 0812   BILIRUBINUR NEGATIVE 01/02/2023 0812   BILIRUBINUR negative 05/12/2018 1213   BILIRUBINUR small 06/29/2014 0859   KETONESUR 5 (A) 01/02/2023 0812   PROTEINUR >=300 (A) 01/02/2023 0812   UROBILINOGEN 0.2 05/12/2018 1213   UROBILINOGEN 1.0 05/02/2014 2015   NITRITE NEGATIVE 01/02/2023 0812   LEUKOCYTESUR NEGATIVE 01/02/2023 1610      Time coordinating discharge: 45 minutes  SIGNED:  Marcellus Scott, MD,  FACP, [facility], [facility], [facility]   [facility] Ad[facility] [place]     To contact the attending provider between 7A-7P or the covering provider during after hours 7P-7A, please log into the web site [email or web address] and access using universal [place] password for that web site. If you do not have the password, please call the hospital operator.

## 2023-01-07 NOTE — TOC Progression Note (Addendum)
Transition of Care [facility]) - Progression Note    Patient Details  Name: Harold [name] MRN: [id number] Date of Birth: 10/06/1955  Transition of Care [facility]) CM/SW Contact  Lorri Frederick, LCSW Phone Number: 01/07/2023, 9:49 AM  Clinical Narrative:  CSW spoke with pt regarding SNF choice and he would like to accept offer at [facility] place.  CSW awaiting confirmation from [facility].    1015: Phineas Semen confirms can receive pt today if approved for auth.  Auth request submitted in [place] and approved: [id number], 7 days: 11/19-11/25.  MD informed.   Expected Discharge Plan: Skilled Nursing Facility Barriers to Discharge: SNF Pending bed offer  Expected Discharge Plan and Services In-house Referral: Clinical Social Work   Post Acute Care Choice: IP Rehab Living arrangements for the past 2 months: Single Family Home                                       Social Determinants of Health (SDOH) Interventions SDOH Screenings   Food Insecurity: No Food Insecurity (01/03/2023)  Housing: Low Risk  (01/03/2023)  Transportation Needs: No Transportation Needs (01/03/2023)  Utilities: Not At Risk (01/03/2023)  Tobacco Use: Medium Risk (01/02/2023)    Readmission Risk Interventions     No data to display

## 2023-01-07 NOTE — Progress Notes (Signed)
Occupational Therapy Treatment Patient Details Name: Harold [name] MRN: [id number] DOB: 04/28/1955 Today's Date: 01/07/2023   History of present illness Pt is a 67 y/o male presenting on 11/14 after a fall. Found with open R ankle fx, s/p I&D, ORIF L ankle. Significant PMH: HTN, HLD, CAD s/p PCI, COPD, DM2, CKD stage IIIb, PVD, gout, OSA.   OT comments  Patient supine in bed and agreeable to OT session.  Pt initially reports transferring to [facility] without RW and "someone close by" but pt attempts to demonstrate this to OT and I immediately stopped this as he was not adhering to NWB to R LE. Pt with poor adherence to NWB to RLE during transfers, requires extensive education on techniques for improved adherence.  He used lateral lean techniques with max assist for donning underwear for LB dressing, but requires standing to fully pull over hips with +2 assist.  Stood with RW with +2 assist, max cueing with any movement to avoid place weight into R LE.   Utilized squat pivot to recliner with mod assist +2.  Continue to recommend <3hrs/day inpatient setting at dc.  Will follow acutely.       If plan is discharge home, recommend the following:  Two people to help with walking and/or transfers;A lot of help with bathing/dressing/bathroom;Supervision due to cognitive status;Assist for transportation;Direct supervision/assist for financial management;Direct supervision/assist for medications management;Assistance with cooking/housework   Equipment Recommendations  Other (comment) (defer)    Recommendations for Other Services      Precautions / Restrictions Precautions Precautions: Fall Restrictions Weight Bearing Restrictions: Yes RLE Weight Bearing: Non weight bearing       Mobility Bed Mobility Overal bed mobility: Needs Assistance Bed Mobility: Supine to Sit     Supine to sit: Supervision, HOB elevated     General bed mobility comments: no physical assist required     Transfers Overall transfer level: Needs assistance Equipment used: Rolling walker (2 wheels) Transfers: Sit to/from Stand, Bed to chair/wheelchair/BSC Sit to Stand: Mod assist, +2 physical assistance   Squat pivot transfers: Mod assist, +2 physical assistance       General transfer comment: Pt reports initally not needing much assist for stand pivot to [facility] and wants to show thearpist how he is doing it- he stood using bed rails and attempts transfer with RW reporting he is NOT putting weight on R LE but therapist had him quickly return to sitting as he was not following NWB precautions.  Used RW and support under R LE to improve adherance for remainder of transfers.  Pt requires dense cues for RLE NWB due to pt unaware that he is WB during standing trial. Pt requires therapist's foot under RLE and mod A +2 to maintain NWB. Pt attempts to stand pivot with cues for technique and assist for balance, however pt continuing to have difficulty with RLE NWB and becomes too fatigued before reaching the recliner and needing to sit back to the bed. Pt completing a squat pivot with mod A +2 using bedpad.     Balance Overall balance assessment: Needs assistance Sitting-balance support: Feet supported Sitting balance-Leahy Scale: Fair Sitting balance - Comments: sitting EOB   Standing balance support: Bilateral upper extremity supported, During functional activity, Reliant on assistive device for balance Standing balance-Leahy Scale: Poor Standing balance comment: heavy reliance on UE support  ADL either performed or assessed with clinical judgement   ADL Overall ADL's : Needs assistance/impaired     Grooming: Set up;Sitting           Upper Body Dressing : Set up;Sitting   Lower Body Dressing: Maximal assistance;+2 for physical assistance;Sitting/lateral leans;Sit to/from stand Lower Body Dressing Details (indicate cue type and reason): pt doffing brief  with min assist using lateral leans, donning brief with assist to thread legs and assist to pull over hips in lateral leans (and fully when standing with +2 assist) Toilet Transfer: Moderate assistance;+2 for physical assistance;Squat-pivot   Toileting- Clothing Manipulation and Hygiene: Sitting/lateral lean;Maximal assistance       Functional mobility during ADLs: Moderate assistance;+2 for physical assistance;Cueing for safety;Cueing for sequencing      Extremity/Trunk Assessment Upper Extremity Assessment Upper Extremity Assessment: Generalized weakness            Vision       Perception     Praxis      Cognition Arousal: Alert Behavior During Therapy: WFL for tasks assessed/performed Overall Cognitive Status: Impaired/Different from baseline Area of Impairment: Safety/judgement, Awareness                         Safety/Judgement: Decreased awareness of safety, Decreased awareness of deficits Awareness: Emergent   General Comments: some decreased awareness of deficits and safety, pt requires cueing to for precaution adherance        Exercises      Shoulder Instructions       General Comments patient educated on NWB and how to maintain in sitting vs standing, pt with poor adherance to NWB with max cueing throughout session.    Pertinent Vitals/ Pain       Pain Assessment Pain Assessment: Faces Faces Pain Scale: Hurts little more Pain Location: R ankle Pain Descriptors / Indicators: Discomfort, Operative site guarding, Burning Pain Intervention(s): Limited activity within patient's tolerance, Monitored during session, Repositioned  Home Living                                          Prior Functioning/Environment              Frequency  Min 1X/week        Progress Toward Goals  OT Goals(current goals can now be found in the care plan section)  Progress towards OT goals: Progressing toward goals  Acute Rehab OT  Goals Patient Stated Goal: rehab OT Goal Formulation: With patient Time For Goal Achievement: 01/17/23 Potential to Achieve Goals: Fair  Plan      Co-evaluation    PT/OT/SLP Co-Evaluation/Treatment: Yes Reason for Co-Treatment: For patient/therapist safety;To address functional/ADL transfers PT goals addressed during session: Mobility/safety with mobility;Balance;Proper use of DME OT goals addressed during session: ADL's and self-care      AM-PAC OT "6 Clicks" Daily Activity     Outcome Measure   Help from another person eating meals?: None Help from another person taking care of personal grooming?: A Little Help from another person toileting, which includes using toliet, bedpan, or urinal?: A Lot Help from another person bathing (including washing, rinsing, drying)?: A Lot Help from another person to put on and taking off regular upper body clothing?: A Little Help from another person to put on and taking off regular lower body clothing?: A Lot 6 Click Score: 16  End of Session Equipment Utilized During Treatment: Gait belt;Rolling walker (2 wheels)  OT Visit Diagnosis: Other abnormalities of gait and mobility (R26.89);History of falling (Z91.81);Pain;Muscle weakness (generalized) (M62.81) Pain - Right/Left: Right Pain - part of body: Ankle and joints of foot   Activity Tolerance Patient tolerated treatment well   Patient Left in chair;with call bell/phone within reach   Nurse Communication Mobility status        Time: 1133-1208 OT Time Calculation (min): 35 min  Charges: OT General Charges $OT Visit: 1 Visit OT Treatments $Self Care/Home Management : 8-22 mins  Barry Brunner, OT Acute Rehabilitation Services Office [phone]   Chancy Milroy 01/07/2023, 1:34 PM

## 2023-01-07 NOTE — Inpatient Diabetes Management (Signed)
Inpatient Diabetes Program Recommendations  AACE/ADA: New Consensus Statement on Inpatient Glycemic Control (2015)  Target Ranges:  Prepandial:   less than 140 mg/dL      Peak postprandial:   less than 180 mg/dL (1-2 hours)      Critically ill patients:  140 - 180 mg/dL   Lab Results  Component Value Date   GLUCAP 78 01/07/2023   HGBA1C 7.0 (H) 01/02/2023    Review of Glycemic Control  Latest Reference Range & Units 01/06/23 07:13 01/06/23 11:12 01/06/23 16:25 01/06/23 20:21 01/07/23 07:48  Glucose-Capillary 70 - 99 mg/dL 540 (H) 981 (H) 191 (H) 102 (H) 78  (H): Data is abnormally high  Diabetes history: DM2 Outpatient Diabetes medications:  U-500 50 units with breakfast and  150 units with dinner Tresiba  Mounjaro 15 mg weekly Tresiba 50 units QD  Current orders for Inpatient glycemic control:  Semglee 50 units every day U-500 50 units BID Novolog 0-15 units TID  Inpatient Diabetes Program Recommendations:    Fasting CBG was 78 mg/dL this morning.  Might consider decreasing U-500 at 1700 to 45 units.    Will continue to follow while inpatient.  Thank you, Dulce Sellar, MSN, CDCES Diabetes Coordinator Inpatient Diabetes Program [phone] (team pager from 8a-5p)

## 2023-01-07 NOTE — Progress Notes (Signed)
Patient in pleasant mood this shift. No complaints of pain. Patient ate 100% of his breakfast. Patient up to chair for lunch.

## 2023-01-08 ENCOUNTER — Inpatient Hospital Stay (HOSPITAL_COMMUNITY)
Admission: EM | Admit: 2023-01-08 | Discharge: 2023-01-14 | DRG: 637 | Disposition: A | Discharge status: Skilled Nursing Facility | Payer: Medicare Other | Source: Skilled Nursing Facility | Attending: Internal Medicine | Admitting: Internal Medicine

## 2023-01-08 DIAGNOSIS — K219 Gastro-esophageal reflux disease without esophagitis: Secondary | ICD-10-CM | POA: Diagnosis present

## 2023-01-08 DIAGNOSIS — E876 Hypokalemia: Secondary | ICD-10-CM | POA: Diagnosis present

## 2023-01-08 DIAGNOSIS — Z95820 Peripheral vascular angioplasty status with implants and grafts: Secondary | ICD-10-CM

## 2023-01-08 DIAGNOSIS — I4891 Unspecified atrial fibrillation: Secondary | ICD-10-CM | POA: Diagnosis not present

## 2023-01-08 DIAGNOSIS — S0990XA Unspecified injury of head, initial encounter: Secondary | ICD-10-CM | POA: Diagnosis present

## 2023-01-08 DIAGNOSIS — S82851B Displaced trimalleolar fracture of right lower leg, initial encounter for open fracture type I or II: Secondary | ICD-10-CM | POA: Diagnosis present

## 2023-01-08 DIAGNOSIS — M7732 Calcaneal spur, left foot: Secondary | ICD-10-CM | POA: Diagnosis not present

## 2023-01-08 DIAGNOSIS — Z7985 Long-term (current) use of injectable non-insulin antidiabetic drugs: Secondary | ICD-10-CM

## 2023-01-08 DIAGNOSIS — T383X5A Adverse effect of insulin and oral hypoglycemic [antidiabetic] drugs, initial encounter: Secondary | ICD-10-CM | POA: Diagnosis not present

## 2023-01-08 DIAGNOSIS — Z7901 Long term (current) use of anticoagulants: Secondary | ICD-10-CM | POA: Diagnosis not present

## 2023-01-08 DIAGNOSIS — I83009 Varicose veins of unspecified lower extremity with ulcer of unspecified site: Secondary | ICD-10-CM | POA: Diagnosis not present

## 2023-01-08 DIAGNOSIS — R296 Repeated falls: Secondary | ICD-10-CM | POA: Diagnosis present

## 2023-01-08 DIAGNOSIS — E1151 Type 2 diabetes mellitus with diabetic peripheral angiopathy without gangrene: Secondary | ICD-10-CM | POA: Diagnosis not present

## 2023-01-08 DIAGNOSIS — Z880 Allergy status to penicillin: Secondary | ICD-10-CM

## 2023-01-08 DIAGNOSIS — E161 Other hypoglycemia: Secondary | ICD-10-CM | POA: Diagnosis not present

## 2023-01-08 DIAGNOSIS — E785 Hyperlipidemia, unspecified: Secondary | ICD-10-CM | POA: Diagnosis not present

## 2023-01-08 DIAGNOSIS — G4733 Obstructive sleep apnea (adult) (pediatric): Secondary | ICD-10-CM | POA: Diagnosis present

## 2023-01-08 DIAGNOSIS — W1830XA Fall on same level, unspecified, initial encounter: Secondary | ICD-10-CM | POA: Insufficient documentation

## 2023-01-08 DIAGNOSIS — E1122 Type 2 diabetes mellitus with diabetic chronic kidney disease: Secondary | ICD-10-CM | POA: Diagnosis present

## 2023-01-08 DIAGNOSIS — R9431 Abnormal electrocardiogram [ECG] [EKG]: Secondary | ICD-10-CM | POA: Diagnosis not present

## 2023-01-08 DIAGNOSIS — S92415A Nondisplaced fracture of proximal phalanx of left great toe, initial encounter for closed fracture: Secondary | ICD-10-CM | POA: Diagnosis present

## 2023-01-08 DIAGNOSIS — Z794 Long term (current) use of insulin: Secondary | ICD-10-CM

## 2023-01-08 DIAGNOSIS — E11649 Type 2 diabetes mellitus with hypoglycemia without coma: Principal | ICD-10-CM | POA: Diagnosis present

## 2023-01-08 DIAGNOSIS — N179 Acute kidney failure, unspecified: Secondary | ICD-10-CM | POA: Diagnosis not present

## 2023-01-08 DIAGNOSIS — S8291XB Unspecified fracture of right lower leg, initial encounter for open fracture type I or II: Secondary | ICD-10-CM | POA: Diagnosis not present

## 2023-01-08 DIAGNOSIS — R404 Transient alteration of awareness: Secondary | ICD-10-CM | POA: Diagnosis not present

## 2023-01-08 DIAGNOSIS — N1832 Chronic kidney disease, stage 3b: Secondary | ICD-10-CM | POA: Diagnosis not present

## 2023-01-08 DIAGNOSIS — Z881 Allergy status to other antibiotic agents status: Secondary | ICD-10-CM

## 2023-01-08 DIAGNOSIS — I2510 Atherosclerotic heart disease of native coronary artery without angina pectoris: Secondary | ICD-10-CM | POA: Diagnosis present

## 2023-01-08 DIAGNOSIS — E1165 Type 2 diabetes mellitus with hyperglycemia: Secondary | ICD-10-CM | POA: Diagnosis not present

## 2023-01-08 DIAGNOSIS — Z888 Allergy status to other drugs, medicaments and biological substances status: Secondary | ICD-10-CM

## 2023-01-08 DIAGNOSIS — I130 Hypertensive heart and chronic kidney disease with heart failure and stage 1 through stage 4 chronic kidney disease, or unspecified chronic kidney disease: Secondary | ICD-10-CM | POA: Diagnosis present

## 2023-01-08 DIAGNOSIS — W19XXXA Unspecified fall, initial encounter: Secondary | ICD-10-CM

## 2023-01-08 DIAGNOSIS — E1169 Type 2 diabetes mellitus with other specified complication: Secondary | ICD-10-CM | POA: Diagnosis present

## 2023-01-08 DIAGNOSIS — S92912A Unspecified fracture of left toe(s), initial encounter for closed fracture: Secondary | ICD-10-CM | POA: Insufficient documentation

## 2023-01-08 DIAGNOSIS — E160 Drug-induced hypoglycemia without coma: Secondary | ICD-10-CM | POA: Diagnosis not present

## 2023-01-08 DIAGNOSIS — Z884 Allergy status to anesthetic agent status: Secondary | ICD-10-CM

## 2023-01-08 DIAGNOSIS — J449 Chronic obstructive pulmonary disease, unspecified: Secondary | ICD-10-CM | POA: Diagnosis present

## 2023-01-08 DIAGNOSIS — Z743 Need for continuous supervision: Secondary | ICD-10-CM | POA: Diagnosis not present

## 2023-01-08 DIAGNOSIS — G9341 Metabolic encephalopathy: Secondary | ICD-10-CM | POA: Diagnosis not present

## 2023-01-08 DIAGNOSIS — D62 Acute posthemorrhagic anemia: Secondary | ICD-10-CM | POA: Diagnosis not present

## 2023-01-08 DIAGNOSIS — Z751 Person awaiting admission to adequate facility elsewhere: Secondary | ICD-10-CM

## 2023-01-08 DIAGNOSIS — Z87891 Personal history of nicotine dependence: Secondary | ICD-10-CM

## 2023-01-08 DIAGNOSIS — Z79890 Hormone replacement therapy (postmenopausal): Secondary | ICD-10-CM

## 2023-01-08 DIAGNOSIS — E039 Hypothyroidism, unspecified: Secondary | ICD-10-CM | POA: Diagnosis not present

## 2023-01-08 DIAGNOSIS — I480 Paroxysmal atrial fibrillation: Secondary | ICD-10-CM | POA: Diagnosis not present

## 2023-01-08 DIAGNOSIS — I499 Cardiac arrhythmia, unspecified: Secondary | ICD-10-CM | POA: Diagnosis not present

## 2023-01-08 DIAGNOSIS — Z9989 Dependence on other enabling machines and devices: Secondary | ICD-10-CM

## 2023-01-08 DIAGNOSIS — S92422A Displaced fracture of distal phalanx of left great toe, initial encounter for closed fracture: Secondary | ICD-10-CM | POA: Diagnosis not present

## 2023-01-08 DIAGNOSIS — Z955 Presence of coronary angioplasty implant and graft: Secondary | ICD-10-CM

## 2023-01-08 DIAGNOSIS — I5032 Chronic diastolic (congestive) heart failure: Secondary | ICD-10-CM | POA: Diagnosis not present

## 2023-01-08 DIAGNOSIS — F32A Depression, unspecified: Secondary | ICD-10-CM | POA: Diagnosis present

## 2023-01-08 DIAGNOSIS — Z6841 Body Mass Index (BMI) 40.0 and over, adult: Secondary | ICD-10-CM

## 2023-01-08 DIAGNOSIS — I2582 Chronic total occlusion of coronary artery: Secondary | ICD-10-CM | POA: Diagnosis present

## 2023-01-08 DIAGNOSIS — E162 Hypoglycemia, unspecified: Principal | ICD-10-CM

## 2023-01-08 DIAGNOSIS — I951 Orthostatic hypotension: Secondary | ICD-10-CM | POA: Diagnosis present

## 2023-01-08 DIAGNOSIS — E66813 Obesity, class 3: Secondary | ICD-10-CM | POA: Diagnosis present

## 2023-01-08 DIAGNOSIS — S92512A Displaced fracture of proximal phalanx of left lesser toe(s), initial encounter for closed fracture: Secondary | ICD-10-CM | POA: Diagnosis not present

## 2023-01-08 DIAGNOSIS — I272 Pulmonary hypertension, unspecified: Secondary | ICD-10-CM | POA: Diagnosis not present

## 2023-01-08 DIAGNOSIS — E1142 Type 2 diabetes mellitus with diabetic polyneuropathy: Secondary | ICD-10-CM | POA: Diagnosis not present

## 2023-01-08 DIAGNOSIS — W06XXXA Fall from bed, initial encounter: Secondary | ICD-10-CM | POA: Diagnosis present

## 2023-01-08 DIAGNOSIS — M19072 Primary osteoarthritis, left ankle and foot: Secondary | ICD-10-CM | POA: Diagnosis not present

## 2023-01-08 DIAGNOSIS — R6889 Other general symptoms and signs: Secondary | ICD-10-CM | POA: Diagnosis not present

## 2023-01-08 DIAGNOSIS — F419 Anxiety disorder, unspecified: Secondary | ICD-10-CM | POA: Diagnosis present

## 2023-01-08 DIAGNOSIS — Y92129 Unspecified place in nursing home as the place of occurrence of the external cause: Secondary | ICD-10-CM

## 2023-01-08 DIAGNOSIS — Z79899 Other long term (current) drug therapy: Secondary | ICD-10-CM

## 2023-01-08 LAB — CBG MONITORING, ED: Glucose-Capillary: 35 mg/dL — CL (ref 70–99)

## 2023-01-08 MED ORDER — DEXTROSE 10 % IV SOLN
100.0000 mL | Freq: Once | INTRAVENOUS | Status: AC
  Administered 2023-01-09: 100 mL via INTRAVENOUS

## 2023-01-08 MED ORDER — DEXTROSE 50 % IV SOLN
INTRAVENOUS | Status: AC
  Administered 2023-01-08: 50 mL
  Filled 2023-01-08: qty 50

## 2023-01-08 MED ORDER — SODIUM CHLORIDE 0.9% FLUSH: 3.0000 mL | INTRAVENOUS | Status: DC | PRN

## 2023-01-08 MED ORDER — SODIUM CHLORIDE 0.9% FLUSH
3.0000 mL | Freq: Two times a day (BID) | INTRAVENOUS | Status: DC
  Administered 2023-01-09 – 2023-01-11 (×6): 3 mL via INTRAVENOUS

## 2023-01-08 NOTE — ED Provider Notes (Addendum)
[facility] AT [facility] Provider Note  CSN: [id number] Arrival date & time: 01/08/23 2344  Chief Complaint(s) No chief complaint on file.  HPI CARA PAYMENT is a 67 y.o. male who presents from rehab facility by EMS.  They were called out after patient rolled out of bed.  This was witnessed by the patient's roommate.  Positive head trauma with unknown LOC.  Patient reportedly stated that he felt like his blood sugar was low and when EMS checked, his blood pressure was in the 40s.  Patient was given D10 and route.  No obvious significant injuries on exam.  Patient was discharged on 1119 after repair of an right open ankle fracture. He has a history of Afib on Eliquis.  The history is provided by the EMS personnel and the spouse.    Past Medical History Past Medical History:  Diagnosis Date   Anginal pain (HCC) 04/2013   Cardiac cath showed patent stents with distal LAD, circumflex-OM and RCA disease in small vessels.   Anxiety    Atrial fibrillation (HCC) 12/2021   CAD S/P percutaneous coronary angioplasty 2003, 04/2012   status post PCI to LAD, circumflex-OM 2, RCA   Carotid artery occlusion    Chronic renal insufficiency, stage II (mild)    Chronic venous insufficiency    varicosities, no reflux; dopplers 04/14/12- valvular insufficiency in the R and L GSV   Complication of anesthesia    COPD, mild (HCC)    Depression    situaltional    Diabetes mellitus    Diabetic neuropathy (HCC) 08/24/2019   Dyslipidemia associated with type 2 diabetes mellitus (HCC)    GERD (gastroesophageal reflux disease)    History of cardioversion 12/2021   at [facility]   HTN (hypertension)    Hypothyroidism    Neuromuscular disorder (HCC)    neuropathy in feet   Neuropathy    notably improved following PCI with improved cardiac function   Obesity    Obesity, Class II, BMI 35.0-39.9, with comorbidity (see actual BMI)    BMI 39; wgt loss efforts in place;  seeing Dietician   PONV (postoperative nausea and vomiting)    "[employer]"   Pulmonary hypertension (HCC) 04/2012   PA pressure   Sleep apnea    uses nightly   Patient Active Problem List   Diagnosis Date Noted   Vitamin D deficiency 01/07/2023   Open ankle fracture 01/02/2023   Prolonged QT interval 01/02/2023   Fall at home, initial encounter 01/02/2023   Leukocytosis 01/02/2023   Acute kidney injury superimposed on chronic kidney disease (HCC) 01/02/2023   Normocytic anemia 01/02/2023   Paroxysmal atrial fibrillation (HCC) 01/02/2023   Uncontrolled type 2 diabetes mellitus with hyperglycemia, with long-term current use of insulin (HCC) 01/02/2023   Hypothyroidism 01/02/2023   CKD stage 3b, GFR 30-44 ml/min (HCC) 11/07/2022   CAD in native artery 11/07/2022   PVD (peripheral vascular disease) (HCC) 11/06/2022   Hypercoagulable state due to persistent atrial fibrillation (HCC) 02/19/2022   Persistent atrial fibrillation (HCC) 01/28/2022   Lumbar stenosis with neurogenic claudication 07/26/2020   Diabetic neuropathy (HCC) 08/24/2019   Contusion of right elbow 09/02/2018   Trigger thumb of left hand 09/02/2018   Atherosclerosis of native artery of both lower extremities with intermittent claudication (HCC) 05/12/2018   Pain of left hand 03/26/2018   Trigger finger 03/03/2017   Pain in finger of left hand 03/03/2017   Snoring 08/20/2016   Morbid (severe) obesity  due to excess calories (HCC) 04/18/2016   Venous stasis of both lower extremities - with edema 02/25/2015   Right-sided chest wall pain 05/08/2014   Gastroesophageal reflux disease without esophagitis 10/10/2013   Chronic heart failure with preserved ejection fraction (HFpEF) (HCC) 06/27/2013   COPD (chronic obstructive pulmonary disease) (HCC) 06/17/2013   Restrictive lung disease 06/17/2013   Myalgia and myositis 04/20/2013   Aftercare following surgery of the circulatory system, NEC 02/22/2013   Obesity,  Class III, BMI 40-49.9 (morbid obesity) (HCC) 09/01/2012   Fatigue 09/01/2012   Preoperative cardiovascular examination 09/01/2012   Chronic renal insufficiency, stage II (mild) 04/30/2012   OSA on CPAP 04/29/2012   Pulmonary hypertension, 04/29/2012   Dyspnea on exertion   04/26/2012   CAD, LAD/CFX DES 04/28/12- staged RCA DES 04/29/12 after pt declined CABG, cath 05/14/13 stable CAD medical therapy    Carotid artery stenosis without cerebral infarction, bilateral 11/07/2011   Diabetes mellitus type 2 with neurological manifestations (HCC) 04/29/2011   Essential hypertension 04/29/2011   Neuropathy 04/29/2011   Hyperlipidemia associated with type 2 diabetes mellitus (HCC) 04/29/2011   Anxiety and depression 04/29/2011   Home Medication(s) Prior to Admission medications   Medication Sig Start Date End Date Taking? Authorizing Provider  acetaminophen (TYLENOL) 500 MG tablet Take 1 tablet (500 mg total) by mouth every 6 (six) hours as needed for mild pain (pain score 1-3). 01/07/23  Yes Hongalgi, Maximino Greenland, MD  allopurinol (ZYLOPRIM) 100 MG tablet Take 1 tablet (100 mg total) by mouth daily. 01/07/23  Yes Hongalgi, Maximino Greenland, MD  amiodarone (PACERONE) 200 MG tablet Take 1 tablet (200 mg total) by mouth daily. 07/11/22  Yes Fenton, Clint R, PA  apixaban (ELIQUIS) 5 MG TABS tablet Take 1 tablet (5 mg total) by mouth 2 (two) times daily. 06/07/22  Yes Marykay Lex, MD  ARIPiprazole (ABILIFY) 5 MG tablet Take 5 mg by mouth daily. 07/29/22  Yes [provider]  carvedilol (COREG) 3.125 MG tablet TAKE 1 TABLET BY MOUTH TWICE A DAY WITH A MEAL 12/13/22  Yes Dunn, Dayna N, PA-C  B-D ULTRAFINE III SHORT PEN 31G X 8 MM MISC Inject 1 each into the skin 3 (three) times daily. 11/30/19   [provider]  Cholecalciferol (VITAMIN D) 125 MCG (5000 UT) CAPS Take 1 capsule by mouth daily. 01/06/23   Montez Morita, PA-C  Continuous Blood Gluc Sensor (FREESTYLE LIBRE 14 DAY SENSOR) MISC APPLY 1  SENSOR EVERY 14 DAYS 08/25/17   [provider]  DULoxetine (CYMBALTA) 60 MG capsule Take 60 mg by mouth daily.    [provider]  ezetimibe (ZETIA) 10 MG tablet Take 10 mg by mouth daily. 05/09/22   [provider]  furosemide (LASIX) 80 MG tablet Take 1 tablet (80 mg total) by mouth 2 (two) times daily. May take additional tablet for weight gain of 3 pounds in one day or five pounds in one week 09/24/22   Marjie Skiff E, PA-C  gabapentin (NEURONTIN) 300 MG capsule Take 1 capsule (300 mg total) by mouth 2 (two) times daily. 01/07/23   Hongalgi, Maximino Greenland, MD  HYDROcodone-acetaminophen (NORCO/VICODIN) 5-325 MG tablet Take 1 tablet by mouth every 6 (six) hours as needed for moderate pain (pain score 4-6). 01/06/23   Montez Morita, PA-C  insulin regular human CONCENTRATED (HUMULIN R U-500 KWIKPEN) 500 UNIT/ML KwikPen Inject 50-150 Units into the skin See admin instructions. Inject 50 units subcutaneously with breakfast and inject 150 units subcutaneously with dinner.  01/07/23   Hongalgi, Maximino Greenland, MD  levothyroxine (SYNTHROID, LEVOTHROID) 88 MCG tablet Take 88 mcg by mouth daily before breakfast.    [provider]  meclizine (ANTIVERT) 25 MG tablet Take 25-37.5 mg by mouth See admin instructions. Take 25mg  by mouth once in the morning, 25mg  by mouth once in the afternoon and 37.5mg  by mouth once in the evening. 02/12/22   [provider]  Multiple Vitamin (MULTIVITAMIN WITH MINERALS) TABS tablet Take 1 tablet by mouth daily. 01/08/23   Hongalgi, Maximino Greenland, MD  mupirocin ointment (BACTROBAN) 2 % Apply 1 Application topically 2 (two) times daily as needed (wound care). 11/21/22   McDonald, Rachelle Hora, DPM  nitroGLYCERIN (NITROSTAT) 0.4 MG SL tablet PLACE 1 TAB UNDER THE TONGUE EVERY 5 MINUTES AS NEEDED FOR CHEST PAIN (SEVERE PRESSURE OR TIGHTNESS) 02/22/19   Marykay Lex, MD  pantoprazole (PROTONIX) 40 MG tablet TAKE 1 TABLET BY MOUTH EVERY DAY 12/13/22   Dunn, Tacey Ruiz,  PA-C  potassium chloride (MICRO-K) 10 MEQ CR capsule Take 10 mEq by mouth daily.  10/19/17   [provider]  PRESCRIPTION MEDICATION Pt uses CPAP Machine at bedtime    [provider]  rosuvastatin (CRESTOR) 20 MG tablet Take 20 mg by mouth every evening.    [provider]  tirzepatide Greggory Keen) 15 MG/0.5ML Pen Inject 15 mg into the skin once a week. 12/30/22     TRESIBA FLEXTOUCH 200 UNIT/ML FlexTouch Pen Inject 50 Units into the skin daily. 01/07/23   Hongalgi, Maximino Greenland, MD                                                                                                                                    Allergies Septra [bactrim], Penicillins, Metformin and related, Other, Sulfamethoxazole-trimethoprim, Wellbutrin [bupropion], and Doxycycline  Review of Systems Review of Systems As noted in HPI  Physical Exam Vital Signs  I have reviewed the triage vital signs BP (!) 195/80   Pulse (!) 56   SpO2 98%   Physical Exam Constitutional:      General: He is not in acute distress.    Appearance: He is well-developed. He is not diaphoretic.  HENT:     Head: Normocephalic.     Right Ear: External ear normal.     Left Ear: External ear normal.  Eyes:     General: No scleral icterus.       Right eye: No discharge.        Left eye: No discharge.     Conjunctiva/sclera: Conjunctivae normal.     Pupils: Pupils are equal, round, and reactive to light.  Cardiovascular:     Rate and Rhythm: Regular rhythm.     Pulses:          Radial pulses are 2+ on the right side and 2+ on the left side.       Dorsalis pedis pulses are 2+  on the right side and 2+ on the left side.     Heart sounds: Normal heart sounds. No murmur heard.    No friction rub. No gallop.  Pulmonary:     Effort: Pulmonary effort is normal. No respiratory distress.     Breath sounds: Normal breath sounds. No stridor.  Abdominal:     General: There is no distension.     Palpations: Abdomen is soft.      Tenderness: There is no abdominal tenderness.  Musculoskeletal:     Cervical back: Normal range of motion and neck supple. No bony tenderness.     Thoracic back: No bony tenderness.     Lumbar back: No bony tenderness.       Legs:     Comments: Clavicle stable. Chest stable to AP/Lat compression. Pelvis stable to Lat compression. No obvious extremity deformity. No chest or abdominal wall contusion.  Skin:    General: Skin is warm.  Neurological:     Mental Status: He is alert and oriented to person, place, and time.     GCS: GCS eye subscore is 4. GCS verbal subscore is 5. GCS motor subscore is 6.     Comments: Moving all extremities      ED Results and Treatments Labs (all labs ordered are listed, but only abnormal results are displayed) Labs Reviewed  COMPREHENSIVE METABOLIC PANEL - Abnormal; Notable for the following components:      Result Value   Potassium 3.0 (*)    Glucose, Bld 662 (*)    BUN 32 (*)    Creatinine, Ser 2.18 (*)    Calcium 8.6 (*)    Total Protein 5.7 (*)    Albumin 3.0 (*)    GFR, Estimated 32 (*)    All other components within normal limits  CBC WITH DIFFERENTIAL/PLATELET - Abnormal; Notable for the following components:   RBC 3.30 (*)    Hemoglobin 10.3 (*)    HCT 30.3 (*)    All other components within normal limits  BASIC METABOLIC PANEL - Abnormal; Notable for the following components:   Glucose, Bld 120 (*)    BUN 31 (*)    Creatinine, Ser 2.18 (*)    Calcium 8.6 (*)    GFR, Estimated 32 (*)    All other components within normal limits  CBG MONITORING, ED - Abnormal; Notable for the following components:   Glucose-Capillary 35 (*)    All other components within normal limits  CBG MONITORING, ED - Abnormal; Notable for the following components:   Glucose-Capillary 141 (*)    All other components within normal limits  CBG MONITORING, ED - Abnormal; Notable for the following components:   Glucose-Capillary 242 (*)    All other  components within normal limits  I-STAT CHEM 8, ED - Abnormal; Notable for the following components:   Potassium 2.9 (*)    BUN 31 (*)    Creatinine, Ser 2.10 (*)    Glucose, Bld 690 (*)    Calcium, Ion 1.10 (*)    Hemoglobin 10.2 (*)    HCT 30.0 (*)    All other components within normal limits  CBG MONITORING, ED - Abnormal; Notable for the following components:   Glucose-Capillary 51 (*)    All other components within normal limits  CK  MAGNESIUM  PHOSPHORUS  URINALYSIS, ROUTINE W REFLEX MICROSCOPIC  CBG MONITORING, ED  CBG MONITORING, ED  CBG MONITORING, ED  EKG  EKG Interpretation Date/Time:  Thursday January 09 2023 00:25:51 EST Ventricular Rate:  55 PR Interval:  223 QRS Duration:  130 QT Interval:  467 QTC Calculation: 447 R Axis:   34  Text Interpretation: Sinus rhythm Prolonged PR interval Nonspecific intraventricular conduction delay Nonspecific T abnormalities, lateral leads Confirmed by Drema Pry [phone]) on 01/09/2023 12:40:40 AM       Radiology DG Foot Complete Left  Result Date: 01/09/2023 CLINICAL DATA:  Fall, left foot pain EXAM: LEFT FOOT - COMPLETE 3+ VIEW COMPARISON:  09/09/2019 FINDINGS: Fracture noted at the base of the left 1st proximal phalanx. Fracture fragment mildly displaced. Degenerative changes at the 1st MTP joint and 1st IP joint. No subluxation or dislocation. Plantar calcaneal spur. Soft tissues are intact. IMPRESSION: Fracture at the base of the left great toe proximal phalanx. Electronically Signed   By: Charlett Nose M.D.   On: 01/09/2023 02:02   CT Cervical Spine Wo Contrast  Result Date: 01/09/2023 CLINICAL DATA:  Fall, on blood thinners EXAM: CT CERVICAL SPINE WITHOUT CONTRAST TECHNIQUE: Multidetector CT imaging of the cervical spine was performed without intravenous contrast. Multiplanar CT image  reconstructions were also generated. RADIATION DOSE REDUCTION: This exam was performed according to the departmental dose-optimization program which includes automated exposure control, adjustment of the mA and/or kV according to patient size and/or use of iterative reconstruction technique. COMPARISON:  None available FINDINGS: Alignment: Normal. Skull base and vertebrae: No acute fracture. No primary bone lesion or focal pathologic process. Soft tissues and spinal canal: No prevertebral fluid or swelling. No visible canal hematoma. Disc levels: Early anterior spurring in the mid and lower cervical spine. Mild degenerative facet disease bilaterally. Upper chest: No acute findings Other: None IMPRESSION: No acute bony abnormality. Electronically Signed   By: Charlett Nose M.D.   On: 01/09/2023 01:08   CT Head Wo Contrast  Result Date: 01/09/2023 CLINICAL DATA:  Fall, on blood thinners. EXAM: CT HEAD WITHOUT CONTRAST TECHNIQUE: Contiguous axial images were obtained from the base of the skull through the vertex without intravenous contrast. RADIATION DOSE REDUCTION: This exam was performed according to the departmental dose-optimization program which includes automated exposure control, adjustment of the mA and/or kV according to patient size and/or use of iterative reconstruction technique. COMPARISON:  None Available. FINDINGS: Brain: Diffuse cerebral atrophy. No acute intracranial abnormality. Specifically, no hemorrhage, hydrocephalus, mass lesion, acute infarction, or significant intracranial injury. Vascular: No hyperdense vessel or unexpected calcification. Skull: No acute calvarial abnormality. Sinuses/Orbits: No acute findings Other: None IMPRESSION: Atrophy.  No acute intracranial abnormality. Electronically Signed   By: Charlett Nose M.D.   On: 01/09/2023 01:07    Medications Ordered in ED Medications  sodium chloride flush (NS) 0.9 % injection 3 mL (has no administration in time range)  sodium  chloride flush (NS) 0.9 % injection 3 mL (has no administration in time range)  dextrose 10 % infusion (100 mLs Intravenous New Bag/Given 01/09/23 0002)  dextrose 50 % solution (50 mLs  Given 01/08/23 2354)   Procedures Procedures  (including critical care time) Medical Decision Making / ED Course   Medical Decision Making Amount and/or Complexity of Data Reviewed Labs: ordered. Decision-making details documented in ED Course. Radiology: ordered and independent interpretation performed. Decision-making details documented in ED Course. ECG/medicine tests: ordered and independent interpretation performed. Decision-making details documented in ED Course.  Risk Prescription drug management. Decision regarding hospitalization.    Fall on thinners Level II by EMS ABC intact Secondary as above  No obvious signs of trauma, but with hypoglycemia will get CT head and C-spine After spouse arrived, he reported that the roommate stated that the patient had been down for several hours before staff came to check on the patient.  She also stated that the patient had new bruising to the left great toe.  Will obtain a x-ray of this as well CT head and cervical spine negative X-ray of the left foot notable for acute fracture of the base of the left great proximal phalanx.   Hypoglycemia On Humulin U-500, Tresiba, and Mounjaro D50 given D10 infusion started Labs ordered. CBGs trended up after D50.  Trended back down while on 100 cc/h of D10.  Increased to 125 cc/h. Hyperglycemia seen on i-STAT Chem-8 and CMP are contaminated from dextrose in the IV line. Improving renal function noted from admission, but still with renal insufficiency.  Consulted hospitalist service spoke with Dr. Lazarus Salines from hospitalist service who agreed to admit patient for further workup and management.     Final Clinical Impression(s) / ED Diagnoses Final diagnoses:  Hypoglycemia  Fall at nursing home, initial encounter   Closed nondisplaced fracture of proximal phalanx of left great toe, initial encounter    This chart was dictated using voice recognition software.  Despite best efforts to proofread,  errors can occur which can change the documentation meaning.    Nira Conn, MD 01/09/23 [phone]

## 2023-01-08 NOTE — ED Provider Notes (Incomplete)
EMERGENCY DEPARTMENT AT [facility] Provider Note  CSN: [id number] Arrival date & time: 01/08/23 2344  Chief Complaint(s) No chief complaint on file.  HPI Harold [name] is a 67 y.o. male who presents from rehab facility by EMS.  They were called out after patient rolled out of bed.  This was witnessed by the patient's roommate.  Positive head trauma with unknown LOC.  Patient reportedly stated that he felt like his blood sugar was low and when EMS checked, his blood pressure was in the 40s.  Patient was given D10 and route.  No obvious significant injuries on exam.  Patient was discharged on 1119 after repair of an right open ankle fracture. He has a history of Afib on Eliquis.  HPI  Past Medical History Past Medical History:  Diagnosis Date  . Anginal pain (HCC) 04/2013   Cardiac cath showed patent stents with distal LAD, circumflex-OM and RCA disease in small vessels.  . Anxiety   . Atrial fibrillation (HCC) 12/2021  . CAD S/P percutaneous coronary angioplasty 2003, 04/2012   status post PCI to LAD, circumflex-OM 2, RCA  . Carotid artery occlusion   . Chronic renal insufficiency, stage II (mild)   . Chronic venous insufficiency    varicosities, no reflux; dopplers 04/14/12- valvular insufficiency in the R and L GSV  . Complication of anesthesia   . COPD, mild (HCC)   . Depression    situaltional   . Diabetes mellitus   . Diabetic neuropathy (HCC) 08/24/2019  . Dyslipidemia associated with type 2 diabetes mellitus (HCC)   . GERD (gastroesophageal reflux disease)   . History of cardioversion 12/2021   at [facility]  . HTN (hypertension)   . Hypothyroidism   . Neuromuscular disorder (HCC)    neuropathy in feet  . Neuropathy    notably improved following PCI with improved cardiac function  . Obesity   . 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 PONV (postoperative  nausea and vomiting)    "[employer]"  . Pulmonary hypertension (HCC) 04/2012   PA pressure  . Sleep apnea    uses nightly   Patient Active Problem List   Diagnosis Date Noted  . Vitamin D deficiency 01/07/2023  . Open ankle fracture 01/02/2023  . Prolonged QT interval 01/02/2023  . Fall at home, initial encounter 01/02/2023  . Leukocytosis 01/02/2023  . Acute kidney injury superimposed on chronic kidney disease (HCC) 01/02/2023  . Normocytic anemia 01/02/2023  . Paroxysmal atrial fibrillation (HCC) 01/02/2023  . Uncontrolled type 2 diabetes mellitus with hyperglycemia, with long-term current use of insulin (HCC) 01/02/2023  . Hypothyroidism 01/02/2023  . CKD stage 3b, GFR 30-44 ml/min (HCC) 11/07/2022  . CAD in native artery 11/07/2022  . PVD (peripheral vascular disease) (HCC) 11/06/2022  . Hypercoagulable state due to persistent atrial fibrillation (HCC) 02/19/2022  . Persistent atrial fibrillation (HCC) 01/28/2022  . Lumbar stenosis with neurogenic claudication 07/26/2020  . Diabetic neuropathy (HCC) 08/24/2019  . Contusion of right elbow 09/02/2018  . Trigger thumb of left hand 09/02/2018  . Atherosclerosis of native artery of both lower extremities with intermittent claudication (HCC) 05/12/2018  . Pain of left hand 03/26/2018  . Trigger finger 03/03/2017  . Pain in finger of left hand 03/03/2017  . Snoring 08/20/2016  . Morbid (severe) obesity due to excess calories (HCC) 04/18/2016  . Venous stasis of both  lower extremities - with edema 02/25/2015  . Right-sided chest wall pain 05/08/2014  . Gastroesophageal reflux disease without esophagitis 10/10/2013  . Chronic heart failure with preserved ejection fraction (HFpEF) (HCC) 06/27/2013  . COPD (chronic obstructive pulmonary disease) (HCC) 06/17/2013  . Restrictive lung disease 06/17/2013  . Myalgia and myositis 04/20/2013  . Aftercare following surgery of the circulatory system, NEC 02/22/2013  . Obesity, Class  III, BMI 40-49.9 (morbid obesity) (HCC) 09/01/2012  . Fatigue 09/01/2012  . Preoperative cardiovascular examination 09/01/2012  . Chronic renal insufficiency, stage II (mild) 04/30/2012  . OSA on CPAP 04/29/2012  . Pulmonary hypertension, 04/29/2012  . Dyspnea on exertion   04/26/2012  . CAD, LAD/CFX DES 04/28/12- staged RCA DES 04/29/12 after pt declined CABG, cath 05/14/13 stable CAD medical therapy   . Carotid artery stenosis without cerebral infarction, bilateral 11/07/2011  . Diabetes mellitus type 2 with neurological manifestations (HCC) 04/29/2011  . Essential hypertension 04/29/2011  . Neuropathy 04/29/2011  . Hyperlipidemia associated with type 2 diabetes mellitus (HCC) 04/29/2011  . Anxiety and depression 04/29/2011   Home Medication(s) Prior to Admission medications   Medication Sig Start Date End Date Taking? Authorizing Provider  acetaminophen (TYLENOL) 500 MG tablet Take 1 tablet (500 mg total) by mouth every 6 (six) hours as needed for mild pain (pain score 1-3). 01/07/23   Hongalgi, Maximino Greenland, MD  allopurinol (ZYLOPRIM) 100 MG tablet Take 1 tablet (100 mg total) by mouth daily. 01/07/23   Hongalgi, Maximino Greenland, MD  amiodarone (PACERONE) 200 MG tablet Take 1 tablet (200 mg total) by mouth daily. 07/11/22   Fenton, Clint R, PA  apixaban (ELIQUIS) 5 MG TABS tablet Take 1 tablet (5 mg total) by mouth 2 (two) times daily. 06/07/22   Marykay Lex, MD  ARIPiprazole (ABILIFY) 5 MG tablet Take 5 mg by mouth daily. 07/29/22   [provider]  B-D ULTRAFINE III SHORT PEN 31G X 8 MM MISC Inject 1 each into the skin 3 (three) times daily. 11/30/19   [provider]  carvedilol (COREG) 3.125 MG tablet TAKE 1 TABLET BY MOUTH TWICE A DAY WITH A MEAL 12/13/22   Dunn, Tacey Ruiz, PA-C  Cholecalciferol (VITAMIN D) 125 MCG (5000 UT) CAPS Take 1 capsule by mouth daily. 01/06/23   Montez Morita, PA-C  Continuous Blood Gluc Sensor (FREESTYLE LIBRE 14 DAY SENSOR) MISC APPLY 1 SENSOR  EVERY 14 DAYS 08/25/17   [provider]  DULoxetine (CYMBALTA) 60 MG capsule Take 60 mg by mouth daily.    [provider]  ezetimibe (ZETIA) 10 MG tablet Take 10 mg by mouth daily. 05/09/22   [provider]  furosemide (LASIX) 80 MG tablet Take 1 tablet (80 mg total) by mouth 2 (two) times daily. May take additional tablet for weight gain of 3 pounds in one day or five pounds in one week 09/24/22   Marjie Skiff E, PA-C  gabapentin (NEURONTIN) 300 MG capsule Take 1 capsule (300 mg total) by mouth 2 (two) times daily. 01/07/23   Hongalgi, Maximino Greenland, MD  HYDROcodone-acetaminophen (NORCO/VICODIN) 5-325 MG tablet Take 1 tablet by mouth every 6 (six) hours as needed for moderate pain (pain score 4-6). 01/06/23   Montez Morita, PA-C  insulin regular human CONCENTRATED (HUMULIN R U-500 KWIKPEN) 500 UNIT/ML KwikPen Inject 50-150 Units into the skin See admin instructions. Inject 50 units subcutaneously with breakfast and inject 150 units subcutaneously with dinner. 01/07/23   Hongalgi, Maximino Greenland, MD  levothyroxine (SYNTHROID, LEVOTHROID) 85  MCG tablet Take 88 mcg by mouth daily before breakfast.    [provider]  meclizine (ANTIVERT) 25 MG tablet Take 25-37.5 mg by mouth See admin instructions. Take 25mg  by mouth once in the morning, 25mg  by mouth once in the afternoon and 37.5mg  by mouth once in the evening. 02/12/22   [provider]  Multiple Vitamin (MULTIVITAMIN WITH MINERALS) TABS tablet Take 1 tablet by mouth daily. 01/08/23   Hongalgi, Maximino Greenland, MD  mupirocin ointment (BACTROBAN) 2 % Apply 1 Application topically 2 (two) times daily as needed (wound care). 11/21/22   McDonald, Rachelle Hora, DPM  nitroGLYCERIN (NITROSTAT) 0.4 MG SL tablet PLACE 1 TAB UNDER THE TONGUE EVERY 5 MINUTES AS NEEDED FOR CHEST PAIN (SEVERE PRESSURE OR TIGHTNESS) 02/22/19   Marykay Lex, MD  pantoprazole (PROTONIX) 40 MG tablet TAKE 1 TABLET BY MOUTH EVERY DAY 12/13/22   Dunn, Tacey Ruiz, PA-C   potassium chloride (MICRO-K) 10 MEQ CR capsule Take 10 mEq by mouth daily.  10/19/17   [provider]  PRESCRIPTION MEDICATION Pt uses CPAP Machine at bedtime    [provider]  rosuvastatin (CRESTOR) 20 MG tablet Take 20 mg by mouth every evening.    [provider]  tirzepatide Greggory Keen) 15 MG/0.5ML Pen Inject 15 mg into the skin once a week. 12/30/22     TRESIBA FLEXTOUCH 200 UNIT/ML FlexTouch Pen Inject 50 Units into the skin daily. 01/07/23   Hongalgi, Maximino Greenland, MD                                                                                                                                    Allergies Septra [bactrim], Penicillins, Metformin and related, Other, Sulfamethoxazole-trimethoprim, Wellbutrin [bupropion], and Doxycycline  Review of Systems Review of Systems As noted in HPI  Physical Exam Vital Signs  I have reviewed the triage vital signs There were no vitals taken for this visit. *** Physical Exam  ED Results and Treatments Labs (all labs ordered are listed, but only abnormal results are displayed) Labs Reviewed  CBG MONITORING, ED - Abnormal; Notable for the following components:      Result Value   Glucose-Capillary 35 (*)    All other components within normal limits  EKG  EKG Interpretation Date/Time:    Ventricular Rate:    PR Interval:    QRS Duration:    QT Interval:    QTC Calculation:   R Axis:      Text Interpretation:         Radiology No results found.  Medications Ordered in ED Medications  dextrose 50 % solution (has no administration in time range)   Procedures Procedures  (including critical care time) Medical Decision Making / ED Course   Medical Decision Making   ***    Final Clinical Impression(s) / ED Diagnoses Final diagnoses:  None    This chart was dictated using  voice recognition software.  Despite best efforts to proofread,  errors can occur which can change the documentation meaning.

## 2023-01-09 ENCOUNTER — Emergency Department (HOSPITAL_COMMUNITY): Payer: Medicare Other

## 2023-01-09 ENCOUNTER — Encounter (HOSPITAL_COMMUNITY): Payer: Self-pay | Admitting: Internal Medicine

## 2023-01-09 ENCOUNTER — Other Ambulatory Visit: Payer: Self-pay

## 2023-01-09 DIAGNOSIS — E11649 Type 2 diabetes mellitus with hypoglycemia without coma: Secondary | ICD-10-CM | POA: Diagnosis present

## 2023-01-09 DIAGNOSIS — T383X5A Adverse effect of insulin and oral hypoglycemic [antidiabetic] drugs, initial encounter: Secondary | ICD-10-CM | POA: Diagnosis present

## 2023-01-09 DIAGNOSIS — S82851B Displaced trimalleolar fracture of right lower leg, initial encounter for open fracture type I or II: Secondary | ICD-10-CM | POA: Diagnosis present

## 2023-01-09 DIAGNOSIS — E160 Drug-induced hypoglycemia without coma: Secondary | ICD-10-CM | POA: Diagnosis not present

## 2023-01-09 DIAGNOSIS — R296 Repeated falls: Secondary | ICD-10-CM | POA: Diagnosis not present

## 2023-01-09 DIAGNOSIS — D649 Anemia, unspecified: Secondary | ICD-10-CM | POA: Diagnosis not present

## 2023-01-09 DIAGNOSIS — Z7401 Bed confinement status: Secondary | ICD-10-CM | POA: Diagnosis not present

## 2023-01-09 DIAGNOSIS — I480 Paroxysmal atrial fibrillation: Secondary | ICD-10-CM | POA: Diagnosis not present

## 2023-01-09 DIAGNOSIS — R278 Other lack of coordination: Secondary | ICD-10-CM | POA: Diagnosis not present

## 2023-01-09 DIAGNOSIS — E1142 Type 2 diabetes mellitus with diabetic polyneuropathy: Secondary | ICD-10-CM | POA: Diagnosis present

## 2023-01-09 DIAGNOSIS — M6281 Muscle weakness (generalized): Secondary | ICD-10-CM | POA: Diagnosis not present

## 2023-01-09 DIAGNOSIS — M7732 Calcaneal spur, left foot: Secondary | ICD-10-CM | POA: Diagnosis not present

## 2023-01-09 DIAGNOSIS — E1169 Type 2 diabetes mellitus with other specified complication: Secondary | ICD-10-CM | POA: Diagnosis present

## 2023-01-09 DIAGNOSIS — W06XXXA Fall from bed, initial encounter: Secondary | ICD-10-CM | POA: Diagnosis present

## 2023-01-09 DIAGNOSIS — E1151 Type 2 diabetes mellitus with diabetic peripheral angiopathy without gangrene: Secondary | ICD-10-CM | POA: Diagnosis present

## 2023-01-09 DIAGNOSIS — I272 Pulmonary hypertension, unspecified: Secondary | ICD-10-CM | POA: Diagnosis not present

## 2023-01-09 DIAGNOSIS — J449 Chronic obstructive pulmonary disease, unspecified: Secondary | ICD-10-CM | POA: Diagnosis not present

## 2023-01-09 DIAGNOSIS — G9341 Metabolic encephalopathy: Secondary | ICD-10-CM | POA: Diagnosis not present

## 2023-01-09 DIAGNOSIS — S0990XA Unspecified injury of head, initial encounter: Secondary | ICD-10-CM | POA: Diagnosis present

## 2023-01-09 DIAGNOSIS — E162 Hypoglycemia, unspecified: Secondary | ICD-10-CM | POA: Diagnosis not present

## 2023-01-09 DIAGNOSIS — R2689 Other abnormalities of gait and mobility: Secondary | ICD-10-CM | POA: Diagnosis not present

## 2023-01-09 DIAGNOSIS — E876 Hypokalemia: Secondary | ICD-10-CM | POA: Diagnosis not present

## 2023-01-09 DIAGNOSIS — Y92129 Unspecified place in nursing home as the place of occurrence of the external cause: Secondary | ICD-10-CM | POA: Diagnosis not present

## 2023-01-09 DIAGNOSIS — E1165 Type 2 diabetes mellitus with hyperglycemia: Secondary | ICD-10-CM | POA: Diagnosis not present

## 2023-01-09 DIAGNOSIS — S92912D Unspecified fracture of left toe(s), subsequent encounter for fracture with routine healing: Secondary | ICD-10-CM | POA: Diagnosis not present

## 2023-01-09 DIAGNOSIS — E039 Hypothyroidism, unspecified: Secondary | ICD-10-CM | POA: Diagnosis not present

## 2023-01-09 DIAGNOSIS — Z6841 Body Mass Index (BMI) 40.0 and over, adult: Secondary | ICD-10-CM | POA: Diagnosis not present

## 2023-01-09 DIAGNOSIS — E161 Other hypoglycemia: Secondary | ICD-10-CM | POA: Diagnosis not present

## 2023-01-09 DIAGNOSIS — N179 Acute kidney failure, unspecified: Secondary | ICD-10-CM | POA: Diagnosis present

## 2023-01-09 DIAGNOSIS — E559 Vitamin D deficiency, unspecified: Secondary | ICD-10-CM | POA: Diagnosis not present

## 2023-01-09 DIAGNOSIS — D62 Acute posthemorrhagic anemia: Secondary | ICD-10-CM | POA: Diagnosis present

## 2023-01-09 DIAGNOSIS — D72829 Elevated white blood cell count, unspecified: Secondary | ICD-10-CM | POA: Diagnosis not present

## 2023-01-09 DIAGNOSIS — N1832 Chronic kidney disease, stage 3b: Secondary | ICD-10-CM | POA: Diagnosis present

## 2023-01-09 DIAGNOSIS — I5032 Chronic diastolic (congestive) heart failure: Secondary | ICD-10-CM | POA: Diagnosis present

## 2023-01-09 DIAGNOSIS — Z95820 Peripheral vascular angioplasty status with implants and grafts: Secondary | ICD-10-CM | POA: Diagnosis not present

## 2023-01-09 DIAGNOSIS — Z794 Long term (current) use of insulin: Secondary | ICD-10-CM | POA: Diagnosis not present

## 2023-01-09 DIAGNOSIS — K219 Gastro-esophageal reflux disease without esophagitis: Secondary | ICD-10-CM | POA: Diagnosis not present

## 2023-01-09 DIAGNOSIS — E785 Hyperlipidemia, unspecified: Secondary | ICD-10-CM | POA: Diagnosis not present

## 2023-01-09 DIAGNOSIS — E1122 Type 2 diabetes mellitus with diabetic chronic kidney disease: Secondary | ICD-10-CM | POA: Diagnosis present

## 2023-01-09 DIAGNOSIS — S92422A Displaced fracture of distal phalanx of left great toe, initial encounter for closed fracture: Secondary | ICD-10-CM | POA: Diagnosis not present

## 2023-01-09 DIAGNOSIS — S82891D Other fracture of right lower leg, subsequent encounter for closed fracture with routine healing: Secondary | ICD-10-CM | POA: Diagnosis not present

## 2023-01-09 DIAGNOSIS — S92912A Unspecified fracture of left toe(s), initial encounter for closed fracture: Secondary | ICD-10-CM | POA: Insufficient documentation

## 2023-01-09 DIAGNOSIS — I130 Hypertensive heart and chronic kidney disease with heart failure and stage 1 through stage 4 chronic kidney disease, or unspecified chronic kidney disease: Secondary | ICD-10-CM | POA: Diagnosis present

## 2023-01-09 DIAGNOSIS — I83009 Varicose veins of unspecified lower extremity with ulcer of unspecified site: Secondary | ICD-10-CM | POA: Diagnosis present

## 2023-01-09 DIAGNOSIS — S92512A Displaced fracture of proximal phalanx of left lesser toe(s), initial encounter for closed fracture: Secondary | ICD-10-CM | POA: Diagnosis not present

## 2023-01-09 DIAGNOSIS — W1830XA Fall on same level, unspecified, initial encounter: Secondary | ICD-10-CM | POA: Insufficient documentation

## 2023-01-09 DIAGNOSIS — F32A Depression, unspecified: Secondary | ICD-10-CM | POA: Diagnosis present

## 2023-01-09 DIAGNOSIS — M19072 Primary osteoarthritis, left ankle and foot: Secondary | ICD-10-CM | POA: Diagnosis not present

## 2023-01-09 LAB — CBG MONITORING, ED
Glucose-Capillary: 141 mg/dL — ABNORMAL HIGH (ref 70–99)
Glucose-Capillary: 242 mg/dL — ABNORMAL HIGH (ref 70–99)
Glucose-Capillary: 51 mg/dL — ABNORMAL LOW (ref 70–99)
Glucose-Capillary: 74 mg/dL (ref 70–99)
Glucose-Capillary: 81 mg/dL (ref 70–99)
Glucose-Capillary: 95 mg/dL (ref 70–99)

## 2023-01-09 LAB — CBC WITH DIFFERENTIAL/PLATELET
Abs Immature Granulocytes: 0.05 10*3/uL (ref 0.00–0.07)
Basophils Absolute: 0.1 10*3/uL (ref 0.0–0.1)
Basophils Relative: 1 %
Eosinophils Absolute: 0.3 10*3/uL (ref 0.0–0.5)
Eosinophils Relative: 3 %
HCT: 30.3 % — ABNORMAL LOW (ref 39.0–52.0)
Hemoglobin: 10.3 g/dL — ABNORMAL LOW (ref 13.0–17.0)
Immature Granulocytes: 1 %
Lymphocytes Relative: 14 %
Lymphs Abs: 1.3 10*3/uL (ref 0.7–4.0)
MCH: 31.2 pg (ref 26.0–34.0)
MCHC: 34 g/dL (ref 30.0–36.0)
MCV: 91.8 fL (ref 80.0–100.0)
Monocytes Absolute: 0.8 10*3/uL (ref 0.1–1.0)
Monocytes Relative: 9 %
Neutro Abs: 7 10*3/uL (ref 1.7–7.7)
Neutrophils Relative %: 72 %
Platelets: 220 10*3/uL (ref 150–400)
RBC: 3.3 MIL/uL — ABNORMAL LOW (ref 4.22–5.81)
RDW: 15.1 % (ref 11.5–15.5)
WBC: 9.4 10*3/uL (ref 4.0–10.5)
nRBC: 0 % (ref 0.0–0.2)

## 2023-01-09 LAB — COMPREHENSIVE METABOLIC PANEL
ALT: 16 U/L (ref 0–44)
AST: 29 U/L (ref 15–41)
Albumin: 3 g/dL — ABNORMAL LOW (ref 3.5–5.0)
Alkaline Phosphatase: 68 U/L (ref 38–126)
Anion gap: 10 (ref 5–15)
BUN: 32 mg/dL — ABNORMAL HIGH (ref 8–23)
CO2: 26 mmol/L (ref 22–32)
Calcium: 8.6 mg/dL — ABNORMAL LOW (ref 8.9–10.3)
Chloride: 103 mmol/L (ref 98–111)
Creatinine, Ser: 2.18 mg/dL — ABNORMAL HIGH (ref 0.61–1.24)
GFR, Estimated: 32 mL/min — ABNORMAL LOW (ref >60)
Glucose, Bld: 662 mg/dL (ref 70–99)
Potassium: 3 mmol/L — ABNORMAL LOW (ref 3.5–5.1)
Sodium: 139 mmol/L (ref 135–145)
Total Bilirubin: 0.7 mg/dL (ref <1.2)
Total Protein: 5.7 g/dL — ABNORMAL LOW (ref 6.5–8.1)

## 2023-01-09 LAB — BASIC METABOLIC PANEL
Anion gap: 8 (ref 5–15)
BUN: 31 mg/dL — ABNORMAL HIGH (ref 8–23)
CO2: 29 mmol/L (ref 22–32)
Calcium: 8.6 mg/dL — ABNORMAL LOW (ref 8.9–10.3)
Chloride: 101 mmol/L (ref 98–111)
Creatinine, Ser: 2.18 mg/dL — ABNORMAL HIGH (ref 0.61–1.24)
GFR, Estimated: 32 mL/min — ABNORMAL LOW (ref >60)
Glucose, Bld: 120 mg/dL — ABNORMAL HIGH (ref 70–99)
Potassium: 3.8 mmol/L (ref 3.5–5.1)
Sodium: 138 mmol/L (ref 135–145)

## 2023-01-09 LAB — GLUCOSE, CAPILLARY
Glucose-Capillary: 78 mg/dL (ref 70–99)
Glucose-Capillary: 136 mg/dL — ABNORMAL HIGH (ref 70–99)
Glucose-Capillary: 158 mg/dL — ABNORMAL HIGH (ref 70–99)
Glucose-Capillary: 181 mg/dL — ABNORMAL HIGH (ref 70–99)

## 2023-01-09 LAB — I-STAT CHEM 8, ED
BUN: 31 mg/dL — ABNORMAL HIGH (ref 8–23)
Calcium, Ion: 1.1 mmol/L — ABNORMAL LOW (ref 1.15–1.40)
Chloride: 100 mmol/L (ref 98–111)
Creatinine, Ser: 2.1 mg/dL — ABNORMAL HIGH (ref 0.61–1.24)
Glucose, Bld: 690 mg/dL (ref 70–99)
HCT: 30 % — ABNORMAL LOW (ref 39.0–52.0)
Hemoglobin: 10.2 g/dL — ABNORMAL LOW (ref 13.0–17.0)
Potassium: 2.9 mmol/L — ABNORMAL LOW (ref 3.5–5.1)
Sodium: 140 mmol/L (ref 135–145)
TCO2: 25 mmol/L (ref 22–32)

## 2023-01-09 LAB — MAGNESIUM: Magnesium: 2.2 mg/dL (ref 1.7–2.4)

## 2023-01-09 LAB — CK: Total CK: 207 U/L (ref 49–397)

## 2023-01-09 LAB — PHOSPHORUS: Phosphorus: 3.4 mg/dL (ref 2.5–4.6)

## 2023-01-09 MED ORDER — DULOXETINE HCL 60 MG PO CPEP
60.0000 mg | ORAL_CAPSULE | Freq: Every day | ORAL | Status: DC
  Administered 2023-01-09 – 2023-01-14 (×6): 60 mg via ORAL
  Filled 2023-01-09 (×6): qty 1

## 2023-01-09 MED ORDER — OXYCODONE HCL 5 MG PO TABS: 5.0000 mg | ORAL_TABLET | Freq: Four times a day (QID) | ORAL | Status: DC | PRN

## 2023-01-09 MED ORDER — GABAPENTIN 300 MG PO CAPS
300.0000 mg | ORAL_CAPSULE | Freq: Every day | ORAL | Status: DC
  Administered 2023-01-09 – 2023-01-13 (×5): 300 mg via ORAL
  Filled 2023-01-09 (×5): qty 1

## 2023-01-09 MED ORDER — DEXTROSE 10 % IV SOLN: 100.0000 mL | Freq: Once | INTRAVENOUS | Status: DC

## 2023-01-09 MED ORDER — POLYETHYLENE GLYCOL 3350 17 G PO PACK: 17.0000 g | PACK | Freq: Every day | ORAL | Status: DC | PRN

## 2023-01-09 MED ORDER — POTASSIUM CHLORIDE 10 MEQ/100ML IV SOLN
10.0000 meq | INTRAVENOUS | Status: DC
Start: 2023-01-09 — End: 2023-01-09
  Administered 2023-01-09: 10 meq via INTRAVENOUS
  Filled 2023-01-09: qty 100

## 2023-01-09 MED ORDER — AMIODARONE HCL 200 MG PO TABS
200.0000 mg | ORAL_TABLET | Freq: Every day | ORAL | Status: DC
  Administered 2023-01-09 – 2023-01-14 (×6): 200 mg via ORAL
  Filled 2023-01-09 (×6): qty 1

## 2023-01-09 MED ORDER — INSULIN ASPART 100 UNIT/ML IJ SOLN
0.0000 [IU] | Freq: Three times a day (TID) | INTRAMUSCULAR | Status: DC
  Administered 2023-01-09: 2 [IU] via SUBCUTANEOUS
  Administered 2023-01-09 – 2023-01-11 (×3): 3 [IU] via SUBCUTANEOUS
  Administered 2023-01-11 (×2): 5 [IU] via SUBCUTANEOUS
  Administered 2023-01-12 (×2): 3 [IU] via SUBCUTANEOUS
  Administered 2023-01-12: 5 [IU] via SUBCUTANEOUS
  Administered 2023-01-13: 2 [IU] via SUBCUTANEOUS
  Administered 2023-01-13 (×2): 3 [IU] via SUBCUTANEOUS
  Administered 2023-01-14: 2 [IU] via SUBCUTANEOUS

## 2023-01-09 MED ORDER — EZETIMIBE 10 MG PO TABS
10.0000 mg | ORAL_TABLET | Freq: Every day | ORAL | Status: DC
  Administered 2023-01-09 – 2023-01-14 (×6): 10 mg via ORAL
  Filled 2023-01-09 (×6): qty 1

## 2023-01-09 MED ORDER — FUROSEMIDE 20 MG PO TABS: 80.0000 mg | ORAL_TABLET | Freq: Two times a day (BID) | ORAL | Status: DC

## 2023-01-09 MED ORDER — DEXTROSE 10 % IV SOLN
INTRAVENOUS | Status: DC
Start: 2023-01-09 — End: 2023-01-09

## 2023-01-09 MED ORDER — ACETAMINOPHEN 500 MG PO TABS
1000.0000 mg | ORAL_TABLET | Freq: Four times a day (QID) | ORAL | Status: DC | PRN
  Administered 2023-01-09 – 2023-01-11 (×3): 1000 mg via ORAL
  Filled 2023-01-09 (×3): qty 2

## 2023-01-09 MED ORDER — OXYCODONE HCL 5 MG PO TABS
2.5000 mg | ORAL_TABLET | Freq: Four times a day (QID) | ORAL | Status: DC | PRN
  Administered 2023-01-09: 2.5 mg via ORAL
  Filled 2023-01-09: qty 1

## 2023-01-09 MED ORDER — POTASSIUM CHLORIDE CRYS ER 20 MEQ PO TBCR
40.0000 meq | EXTENDED_RELEASE_TABLET | Freq: Once | ORAL | Status: AC
  Administered 2023-01-09: 40 meq via ORAL
  Filled 2023-01-09: qty 2

## 2023-01-09 MED ORDER — PANTOPRAZOLE SODIUM 40 MG PO TBEC
40.0000 mg | DELAYED_RELEASE_TABLET | Freq: Every day | ORAL | Status: DC
  Administered 2023-01-09 – 2023-01-14 (×6): 40 mg via ORAL
  Filled 2023-01-09 (×6): qty 1

## 2023-01-09 MED ORDER — OXYCODONE HCL 5 MG PO TABS
5.0000 mg | ORAL_TABLET | ORAL | Status: DC | PRN
  Administered 2023-01-09 – 2023-01-14 (×11): 5 mg via ORAL
  Filled 2023-01-09 (×9): qty 1

## 2023-01-09 MED ORDER — OXYCODONE HCL 5 MG PO TABS
2.5000 mg | ORAL_TABLET | ORAL | Status: DC | PRN
  Filled 2023-01-09: qty 1

## 2023-01-09 MED ORDER — LEVOTHYROXINE SODIUM 88 MCG PO TABS
88.0000 ug | ORAL_TABLET | Freq: Every day | ORAL | Status: DC
  Administered 2023-01-09 – 2023-01-14 (×6): 88 ug via ORAL
  Filled 2023-01-09 (×6): qty 1

## 2023-01-09 MED ORDER — APIXABAN 5 MG PO TABS
5.0000 mg | ORAL_TABLET | Freq: Two times a day (BID) | ORAL | Status: DC
  Administered 2023-01-09 – 2023-01-14 (×11): 5 mg via ORAL
  Filled 2023-01-09 (×11): qty 1

## 2023-01-09 MED ORDER — CARVEDILOL 3.125 MG PO TABS
3.1250 mg | ORAL_TABLET | Freq: Two times a day (BID) | ORAL | Status: DC
  Administered 2023-01-09 – 2023-01-14 (×10): 3.125 mg via ORAL
  Filled 2023-01-09 (×10): qty 1

## 2023-01-09 MED ORDER — ORAL CARE MOUTH RINSE: 15.0000 mL | OROMUCOSAL | Status: DC | PRN

## 2023-01-09 MED ORDER — ARIPIPRAZOLE 5 MG PO TABS
5.0000 mg | ORAL_TABLET | Freq: Every day | ORAL | Status: DC
  Administered 2023-01-09 – 2023-01-14 (×6): 5 mg via ORAL
  Filled 2023-01-09 (×6): qty 1

## 2023-01-09 MED ORDER — VITAMIN D 25 MCG (1000 UNIT) PO TABS
125.0000 ug | ORAL_TABLET | Freq: Every day | ORAL | Status: DC
  Administered 2023-01-09 – 2023-01-14 (×6): 125 ug via ORAL
  Filled 2023-01-09: qty 4
  Filled 2023-01-09 (×2): qty 1
  Filled 2023-01-09 (×2): qty 5
  Filled 2023-01-09: qty 1
  Filled 2023-01-09: qty 5

## 2023-01-09 MED ORDER — ROSUVASTATIN CALCIUM 20 MG PO TABS
20.0000 mg | ORAL_TABLET | Freq: Every evening | ORAL | Status: DC
  Administered 2023-01-09 – 2023-01-13 (×5): 20 mg via ORAL
  Filled 2023-01-09 (×5): qty 1

## 2023-01-09 MED ORDER — OXYCODONE HCL 5 MG PO TABS
5.0000 mg | ORAL_TABLET | Freq: Once | ORAL | Status: AC
  Filled 2023-01-09: qty 1

## 2023-01-09 MED ORDER — POTASSIUM CHLORIDE 10 MEQ/100ML IV SOLN: 10.0000 meq | INTRAVENOUS | Status: DC

## 2023-01-09 MED ORDER — ALLOPURINOL 100 MG PO TABS
100.0000 mg | ORAL_TABLET | Freq: Every day | ORAL | Status: DC
  Administered 2023-01-09 – 2023-01-14 (×6): 100 mg via ORAL
  Filled 2023-01-09 (×6): qty 1

## 2023-01-09 NOTE — Plan of Care (Signed)

## 2023-01-09 NOTE — Inpatient Diabetes Management (Addendum)
Inpatient Diabetes Program Recommendations  AACE/ADA: New Consensus Statement on Inpatient Glycemic Control (2015)  Target Ranges:  Prepandial:   less than 140 mg/dL      Peak postprandial:   less than 180 mg/dL (1-2 hours)      Critically ill patients:  140 - 180 mg/dL   Lab Results  Component Value Date   GLUCAP 136 (H) 01/09/2023   HGBA1C 7.0 (H) 01/02/2023    Review of Glycemic Control  Diabetes history: type 2 Outpatient Diabetes medications: Humulin Reg U-500 insulin 50 units in am, 150 units in pm, Tresiba 50 units at HS, Mounjaro 15 mg weekly Current orders for Inpatient glycemic control: Novolog 0-15 units correction scale TID  Inpatient Diabetes Program Recommendations:   Spoke with patient at the bedside. Patient explained what happened with him falling asleep and falling out of bed. He was not alert at the time of the fall and stayed on the floor for several hours. Roommate went to get help after he had been on the floor for some time. Was brought to the ED. CBG at time of admission was 35 mg/dl. Continued to be confused on admission.   Patient states that the blood sugars had only been checked a few times at the SNF after his admission. He was not able to eat the food that was served him there. He states that he was still receiving insulin even though he was not eating nearly the amount that he can eat at home.  Will continue to monitor blood sugars while in the hospital. Patient's insulin needs may be changing. May need to titrate his insulin dosages once he is to be discharged from the hospital.  Smith Mince RN BSN CDE Diabetes Coordinator Pager: [phone]  8am-5pm

## 2023-01-09 NOTE — ED Notes (Signed)
CBGs done on both hands simutaneously to see difference in CBG. 54 and 71. Per MD pt is clear to go to floor bed and can remain untreated with d 50 as we will assume the 74 reading as long as pt remains asymptomatic, which pt is at present.

## 2023-01-09 NOTE — Progress Notes (Signed)
   01/09/23 2106  BiPAP/CPAP/SIPAP  BiPAP/CPAP/SIPAP Pt Type Adult  BiPAP/CPAP/SIPAP Resmed  Mask Type Full face mask  Mask Size Medium  EPAP 5 cmH2O  Patient Home Equipment No  Auto Titrate No   Pt. Already wearing cpap for tonight and tolerating well.

## 2023-01-09 NOTE — H&P (Addendum)
History and Physical    EROS Harold [name]:[id number] DOB: 1955-11-12 DOA: 01/08/2023  PCP: [name], MD   Patient coming from: SNF   Chief Complaint:  Chief Complaint  Patient presents with   Fall    HPI:  Harold [name] is a 67 y.o. male with hx of HTN, HLD, CAD s/p PCI, A-fib on Eliquis, COPD, OSA on nightly CPAP, DM2 with renal complications and peripheral neuropathy, stage IIIb CKD, PAD s/p stents, anxiety and depression, gout, tobacco abuse, orthostatic hypotension, frequent falls, recent admission 11/14 - 19 after ground level fall c/b open right ankle fracture s/p ORIF on 11/14. Was brought in from SNF after found down on the ground after apparent fall, associated with AMS and hypoglycemia in the 40's. Patient reports decreased PO intake at SNF due to quality of the food there. Evening PTA ate a [place] sandwich which is less than typical, and thinks he received full dose of Humulin R 150 units with this. Had also received normal dose of Tresiba 50 units in the morning. Felt normal around 7PM. His roommate at SNF noted that he had fallen out of bed and they called RN but were unable to have someone come to room until roommate wheeled himself out of room to get help. At time of EMS arrival BG in 40s and treated with D50. Patient feels his confusion is improving with treatment with D50 and D10 here in the ED. Overall no other changes in meds or symptomatically since going to SNF. Unhappy with the care there and wanting to speak with SW about potential change in SNF or options available.    Review of Systems:  ROS complete and negative except as marked above   Allergies  Allergen Reactions   Septra [Bactrim] Itching   Penicillins Rash    Allergy to All cillin drugs  Ancef given 9/24 with no obvious reaction   Metformin And Related Diarrhea and Nausea Only   Other Nausea And Vomiting    General Anesthesia - sometimes causes nausea and vomiting * OK to use scopolamine  patch     Sulfamethoxazole-Trimethoprim Other (See Comments)   Wellbutrin [Bupropion]     Mood Changes   Doxycycline Hives and Rash    Prior to Admission medications   Medication Sig Start Date End Date Taking? Authorizing Provider  acetaminophen (TYLENOL) 500 MG tablet Take 1 tablet (500 mg total) by mouth every 6 (six) hours as needed for mild pain (pain score 1-3). 01/07/23  Yes Hongalgi, Maximino Greenland, MD  allopurinol (ZYLOPRIM) 100 MG tablet Take 1 tablet (100 mg total) by mouth daily. 01/07/23  Yes Hongalgi, Maximino Greenland, MD  amiodarone (PACERONE) 200 MG tablet Take 1 tablet (200 mg total) by mouth daily. 07/11/22  Yes Fenton, Clint R, PA  apixaban (ELIQUIS) 5 MG TABS tablet Take 1 tablet (5 mg total) by mouth 2 (two) times daily. 06/07/22  Yes Marykay Lex, MD  ARIPiprazole (ABILIFY) 5 MG tablet Take 5 mg by mouth daily. 07/29/22  Yes [provider]  carvedilol (COREG) 3.125 MG tablet TAKE 1 TABLET BY MOUTH TWICE A DAY WITH A MEAL 12/13/22  Yes Dunn, Dayna N, PA-C  Cholecalciferol (VITAMIN D) 125 MCG (5000 UT) CAPS Take 1 capsule by mouth daily. 01/06/23  Yes Montez Morita, PA-C  DULoxetine (CYMBALTA) 60 MG capsule Take 60 mg by mouth daily.   Yes [provider]  ezetimibe (ZETIA) 10 MG tablet Take 10 mg by mouth daily. 05/09/22  Yes [provider]  furosemide (LASIX) 80 MG tablet Take 1 tablet (80 mg total) by mouth 2 (two) times daily. May take additional tablet for weight gain of 3 pounds in one day or five pounds in one week 09/24/22  Yes Marjie Skiff E, PA-C  gabapentin (NEURONTIN) 300 MG capsule Take 1 capsule (300 mg total) by mouth 2 (two) times daily. 01/07/23  Yes Hongalgi, Maximino Greenland, MD  HYDROcodone-acetaminophen (NORCO/VICODIN) 5-325 MG tablet Take 1 tablet by mouth every 6 (six) hours as needed for moderate pain (pain score 4-6). 01/06/23  Yes Montez Morita, PA-C  insulin regular human CONCENTRATED (HUMULIN R U-500 KWIKPEN) 500 UNIT/ML KwikPen Inject 50-150  Units into the skin See admin instructions. Inject 50 units subcutaneously with breakfast and inject 150 units subcutaneously with dinner. 01/07/23  Yes Hongalgi, Maximino Greenland, MD  levothyroxine (SYNTHROID, LEVOTHROID) 88 MCG tablet Take 88 mcg by mouth daily before breakfast.   Yes [provider]  meclizine (ANTIVERT) 25 MG tablet Take 25-37.5 mg by mouth See admin instructions. Take 25mg  by mouth once in the morning, 25mg  by mouth once in the afternoon and 37.5mg  by mouth once in the evening. 02/12/22  Yes [provider]  Multiple Vitamin (MULTIVITAMIN WITH MINERALS) TABS tablet Take 1 tablet by mouth daily. 01/08/23  Yes Hongalgi, Maximino Greenland, MD  nitroGLYCERIN (NITROSTAT) 0.4 MG SL tablet PLACE 1 TAB UNDER THE TONGUE EVERY 5 MINUTES AS NEEDED FOR CHEST PAIN (SEVERE PRESSURE OR TIGHTNESS) 02/22/19  Yes Marykay Lex, MD  pantoprazole (PROTONIX) 40 MG tablet TAKE 1 TABLET BY MOUTH EVERY DAY 12/13/22  Yes Dunn, Dayna N, PA-C  potassium chloride (MICRO-K) 10 MEQ CR capsule Take 10 mEq by mouth daily.  10/19/17  Yes [provider]  PRESCRIPTION MEDICATION Pt uses CPAP Machine at bedtime   Yes [provider]  rosuvastatin (CRESTOR) 20 MG tablet Take 20 mg by mouth every evening.   Yes [provider]  tirzepatide Greggory Keen) 15 MG/0.5ML Pen Inject 15 mg into the skin once a week. 12/30/22  Yes   TRESIBA FLEXTOUCH 200 UNIT/ML FlexTouch Pen Inject 50 Units into the skin daily. 01/07/23  Yes Hongalgi, Maximino Greenland, MD  B-D ULTRAFINE III SHORT PEN 31G X 8 MM MISC Inject 1 each into the skin 3 (three) times daily. 11/30/19   [provider]  Continuous Blood Gluc Sensor (FREESTYLE LIBRE 14 DAY SENSOR) MISC APPLY 1 SENSOR EVERY 14 DAYS 08/25/17   [provider]  mupirocin ointment (BACTROBAN) 2 % Apply 1 Application topically 2 (two) times daily as needed (wound care). 11/21/22   Edwin Cap, DPM    Past Medical History:  Diagnosis Date   Anginal pain  (HCC) 04/2013   Cardiac cath showed patent stents with distal LAD, circumflex-OM and RCA disease in small vessels.   Anxiety    Atrial fibrillation (HCC) 12/2021   CAD S/P percutaneous coronary angioplasty 2003, 04/2012   status post PCI to LAD, circumflex-OM 2, RCA   Carotid artery occlusion    Chronic renal insufficiency, stage II (mild)    Chronic venous insufficiency    varicosities, no reflux; dopplers 04/14/12- valvular insufficiency in the R and L GSV   Complication of anesthesia    COPD, mild (HCC)    Depression    situaltional    Diabetes mellitus    Diabetic neuropathy (HCC) 08/24/2019   Dyslipidemia associated with type 2 diabetes mellitus (HCC)    GERD (gastroesophageal reflux disease)    History of  cardioversion 12/2021   at [facility]   HTN (hypertension)    Hypothyroidism    Neuromuscular disorder (HCC)    neuropathy in feet   Neuropathy    notably improved following PCI with improved cardiac function   Obesity    Obesity, Class II, BMI 35.0-39.9, with comorbidity (see actual BMI)    BMI 39; wgt loss efforts in place; seeing Dietician   PONV (postoperative nausea and vomiting)    "Patch Works"   Pulmonary hypertension (HCC) 04/2012   PA pressure   Sleep apnea    uses nightly    Past Surgical History:  Procedure Laterality Date   ABDOMINAL AORTAGRAM N/A 11/15/2011   Procedure: ABDOMINAL Ronny Flurry;  Surgeon: Sherren Kerns, MD;  Location: [facility] CATH LAB;  Service: Cardiovascular;  [name];   ABDOMINAL AORTOGRAM W/LOWER EXTREMITY N/A 10/13/2019   Procedure: ABDOMINAL AORTOGRAM W/LOWER EXTREMITY;  Surgeon: Iran Ouch, MD;  Location: [facility] INVASIVE CV LAB;  Service: CV: Non-obst Ao-Iliac. R SFA-PopA patent with significant calcific stenosis of TP trunk-prox PTA (dominant) & CTO ATA  -> R PTA-TP PTCA   ABDOMINAL AORTOGRAM W/LOWER EXTREMITY N/A 11/06/2022   Procedure: ABDOMINAL AORTOGRAM W/LOWER EXTREMITY;  Surgeon: Iran Ouch, MD;  Location:  [facility] INVASIVE CV LAB;  Service: Cardiovascular;  [name];   ABIs  04/27/2012   mild bilateral arterial insufficiency   BACK SURGERY  2005 x1   X2-2010   BUNIONECTOMY Right 11/11/2013   Procedure: RIGHT FOOT SILVER BUNIONECTOMY;  Surgeon: Toni Arthurs, MD;  Location: [facility];  Service: Orthopedics;  Laterality: Right;   CARDIAC CATHETERIZATION  12/11/2001   significant 3V CAD, normal LV function   CARDIOVERSION N/A 03/13/2022   Procedure: CARDIOVERSION;  Surgeon: Little Ishikawa, MD;  Location: [facility] ENDOSCOPY;  Service: Cardiovascular;  [name];   CARDIOVERSION N/A 07/25/2022   Procedure: CARDIOVERSION;  Surgeon: Jake Bathe, MD;  Location: [facility] INVASIVE CV LAB;  Service: Cardiovascular;  [name];   Carotid Doppler  04/27/2012   right internal carotid: Elevated velocities but no evidence of plaque. Left internal carotid 40-59%   CAROTID ENDARTERECTOMY  2005   Right; recent carotid Dopplers notes elevated velocities.    colonscopy     CORONARY ANGIOPLASTY  12/21/2001   PTCA of the distal and mid AV groove circ, unsuccessful PTCA of second OM total occlusion, unsuccessful PTCA of the apical LAD total occlusion   CORONARY ANGIOPLASTY WITH STENT PLACEMENT  04/29/2012   PCI to 3 RCA lesions, Promus Premiere 2.46mmx8mm distally, mid was 2.61mx28mm and proximally 2.75x108mm, EF 55-60%   CORONARY STENT PLACEMENT  04/28/2012   PCI to LAD (3x48mm Xience DES postdilated to 3.25) and circ prox and mid (2 overlappinmg 2.57mmx12mmXience DES postdilated to 2.78mm)   DOPPLER ECHOCARDIOGRAPHY  04/28/2012   poor quality study: EF estimated 60-65%; unable to assess diastolic function (previously noted to have diastolic dysfunction); severely dilated left atrium and mild right atrium; dilated IVC consistent with increased central venous pressure.Marland Kitchen   HERNIA REPAIR     I & D EXTREMITY Right 01/02/2023   Procedure: IRRIGATION AND DEBRIDEMENT RIGHT ANKLE;  Surgeon: [name], MD;  [name];  Service: Orthopedics;  Laterality: Right;   LEFT AND RIGHT HEART CATHETERIZATION WITH CORONARY ANGIOGRAM N/A 04/27/2012   Procedure: LEFT AND RIGHT HEART CATHETERIZATION WITH CORONARY ANGIOGRAM;  Surgeon: Marykay Lex, MD;  Location: [facility] CATH LAB;  Service: Cardiovascular;  [name];   LEFT AND RIGHT HEART CATHETERIZATION  WITH CORONARY ANGIOGRAM N/A 05/14/2013   Procedure: LEFT AND RIGHT HEART CATHETERIZATION WITH CORONARY ANGIOGRAM;  Surgeon: Lennette Bihari, MD;  [name]; PCWP ;; [name], [name], Patent RCA ostial mid and distal stents - 70% distal runoff Dz   LUMBAR LAMINECTOMY/DECOMPRESSION MICRODISCECTOMY  01/07/2012   Procedure: LUMBAR LAMINECTOMY/DECOMPRESSION MICRODISCECTOMY 1 LEVEL;  Surgeon: Carmela Hurt, MD;  [name];  Service: Neurosurgery;  Laterality: Bilateral;   Lumbar Three-Four Decompression   LUMBAR LAMINECTOMY/DECOMPRESSION MICRODISCECTOMY N/A 07/26/2020   Procedure: Lumbar two-three Laminectomy/Foraminotomy;  Surgeon: Coletta Memos, MD;  [name];  Service: Neurosurgery;  [name];   ORIF ANKLE FRACTURE Right 01/02/2023   Procedure: OPEN REDUCTION INTERNAL FIXATION (ORIF)RIGHT ANKLE FRACTURE;  Surgeon: [name], MD;  [name];  Service: Orthopedics;  Laterality: Right;   PERCUTANEOUS CORONARY STENT INTERVENTION (PCI-S) N/A 04/28/2012   Procedure: PERCUTANEOUS CORONARY STENT INTERVENTION (PCI-S);  Surgeon: Lennette Bihari, MD;  Location[facility] CATH LAB;  Service: Cardiovascular;  [name];   PERCUTANEOUS CORONARY STENT INTERVENTION (PCI-S) N/A 04/29/2012   Procedure: PERCUTANEOUS CORONARY STENT INTERVENTION (PCI-S);  Surgeon: Marykay Lex, MD;  Location: [facility] CATH LAB;  Service: Cardiovascular;  [name];   PERIPHERAL VASCULAR  ATHERECTOMY  11/06/2022   Procedure: PERIPHERAL VASCULAR ATHERECTOMY;  Surgeon: Iran Ouch, MD;  [facility] LAB;  Service: Cardiovascular;;   PERIPHERAL VASCULAR BALLOON ANGIOPLASTY Right 10/13/2019   Procedure: PERIPHERAL VASCULAR BALLOON ANGIOPLASTY;  Surgeon: Iran Ouch, MD;  [facility]: [facility]AS[facility] CV LAB;  Service: Cardiovascular;  Laterality: Right;  PTCA of R TP Trunk into PTA (Drug-coated) => Follow-up ABIs 0.9 on the right and 0.99 on the left.   SPINE SURGERY     UMBILICAL HERNIA REPAIR  2009   steel mesh insert     reports that he quit smoking about 19 years ago. His smoking use included cigarettes. He started smoking about 61 years ago. He has a 42 pack-year smoking history. He has never used smokeless tobacco. He reports that he does not drink alcohol and does not use drugs.  Family History  Adopted: Yes  Family history unknown: Yes     Physical Exam: Vitals:   01/09/23 0230 01/09/23 0400 01/09/23 0430 01/09/23 0439  BP: (!) 186/65 (!) 172/70 (!) 155/71   Pulse: (!) 57 (!) 57 62 60  Resp:   17 18  Temp:   98.1 F (36.7 C)   TempSrc:   Oral   SpO2: 97% 99% 100% 100%  Weight:      Height:        Gen: Awake, alert, NAD CV: Regular, normal S1, S2, no murmurs  Resp: Normal WOB, CTAB  Abd: Flat, normoactive, nontender MSK: Limited eval of RLE due to splint, 1+ pitting edema at ankle on the left.  Skin: Abrasion over the R forearm, there is a 1 cm hemorrhagic bulla in the web between 1st and 2nd digit on the R hand. RLE has dressing and splint in place. L foot with ecchymosis most at the 1st toe.  Neuro: Alert and interactive, fully oriented  Psych: euthymic, appropriate    Data review:   Labs reviewed, notable for:   Fingerstick CBG 35 -> max of 242;  Note serum CBG 690, with fingerstick around same time of 141 (unclear BMP was drawn off  line and receiving D10 / D50 prior) K 2.9  Cr 2.1, improved from prior  Hb 10 stable  Recent A1c 7%    Micro:  Results for orders placed or performed during the hospital encounter of 01/02/23  Surgical pcr screen     Status: Abnormal   Collection Time: 01/02/23  9:31 AM   Specimen: Nasal Mucosa; Nasal Swab  Result Value Ref Range Status   MRSA, PCR POSITIVE (A) NEGATIVE Final    Comment: RESULT CALLED TO, READ BACK BY AND VERIFIED WITH: [place] [address] @1210H  BY SM    Staphylococcus aureus POSITIVE (A) NEGATIVE Final    Comment: (NOTE) The Xpert SA Assay (FDA approved for NASAL specimens in patients 10 years of age and older), is one component of a comprehensive surveillance program. It is not intended to diagnose infection nor to guide or monitor treatment. Performed at [facility], [address], [place], [place] [address]    *Note: Due to a large number of results and/or encounters for the requested time period, some results have not been displayed. A complete set of results can be found in Results Review.    Imaging reviewed:  DG Foot Complete Left  Result Date: 01/09/2023 CLINICAL DATA:  Fall, left foot pain EXAM: LEFT FOOT - COMPLETE 3+ VIEW COMPARISON:  09/09/2019 FINDINGS: Fracture noted at the base of the left 1st proximal phalanx. Fracture fragment mildly displaced. Degenerative changes at the 1st MTP joint and 1st IP joint. No subluxation or dislocation. Plantar calcaneal spur. Soft tissues are intact. IMPRESSION: Fracture at the base of the left great toe proximal phalanx. Electronically Signed   By: Charlett Nose M.D.   On: 01/09/2023 02:02   CT Cervical Spine Wo Contrast  Result Date: 01/09/2023 CLINICAL DATA:  Fall, on blood thinners EXAM: CT CERVICAL SPINE WITHOUT CONTRAST TECHNIQUE: Multidetector CT imaging of the cervical spine was performed without intravenous contrast. Multiplanar CT image reconstructions were also generated. RADIATION DOSE REDUCTION: This exam was performed according to the departmental dose-optimization program which includes  automated exposure control, adjustment of the mA and/or kV according to patient size and/or use of iterative reconstruction technique. COMPARISON:  None available FINDINGS: Alignment: Normal. Skull base and vertebrae: No acute fracture. No primary bone lesion or focal pathologic process. Soft tissues and spinal canal: No prevertebral fluid or swelling. No visible canal hematoma. Disc levels: Early anterior spurring in the mid and lower cervical spine. Mild degenerative facet disease bilaterally. Upper chest: No acute findings Other: None IMPRESSION: No acute bony abnormality. Electronically Signed   By: Charlett Nose M.D.   On: 01/09/2023 01:08   CT Head Wo Contrast  Result Date: 01/09/2023 CLINICAL DATA:  Fall, on blood thinners. EXAM: CT HEAD WITHOUT CONTRAST TECHNIQUE: Contiguous axial images were obtained from the base of the skull through the vertex without intravenous contrast. RADIATION DOSE REDUCTION: This exam was performed according to the departmental dose-optimization program which includes automated exposure control, adjustment of the mA and/or kV according to patient size and/or use of iterative reconstruction technique. COMPARISON:  None Available. FINDINGS: Brain: Diffuse cerebral atrophy. No acute intracranial abnormality. Specifically, no hemorrhage, hydrocephalus, mass lesion, acute infarction, or significant intracranial injury. Vascular: No hyperdense vessel or unexpected calcification. Skull: No acute calvarial abnormality. Sinuses/Orbits: No acute findings Other: None IMPRESSION: Atrophy.  No acute intracranial abnormality. Electronically Signed   By: Charlett Nose M.D.   On: 01/09/2023 01:07      EMS/ ED Course:  Fingerstick  BG in 40's on EMS arrival, patient altered. Treated with D50. Repeat D50 and then started on D10 gtt in ED. With BG dropping D10 gtt has been increased. Patients mental status has improved with treatment    Assessment/Plan:  67 y.o. male with hx HTN, HLD, CAD  s/p PCI, A-fib on Eliquis, COPD, OSA on nightly CPAP, DM2 with renal complications and peripheral neuropathy, stage IIIb CKD, PAD s/p stents, anxiety and depression, gout, tobacco abuse, orthostatic hypotension, frequent falls, recent admission 11/14 - 19 after ground level fall c/b open right ankle fracture s/p ORIF on 11/14. Was brought in from SNF after found down on the ground after apparent fall, associated with AMS and hypoglycemia in the 40's.  Ground level fall, fall out of bed  Hypoglycemia associated with insulin use, hx DM type 2  Metabolic encephalopathy secondary to hypoglycemia, improved  Hx recurrent falls out of bed, nature of fall PTA unknown due to patients AMS around this time. Found on the ground by roommate at SNF. On EMS arrival BG in 40s, and treated with D50. Has continued hypoglycemia via fingerstick in ED and now on D10 gtt. His mental status has improved with treatment of hypoglycemia. Interestingly his serum BG is high in 690s although unclear if this was drawn off of a line, will evaluate this further below. For now continuing treatment for presumed true hypoglycemia with serum suspected artifact presumed drawn off line. Home insulin regimen is Tresiba 50 units in AM, Humulin R U500 50 U with breakfast and 150 U with dinner. Reason for hypoglycemia likely decreased PO intake along with continued high insulin dosage.  - Continue D10 gtt at 125 cc/hr for now  - Repeat BMP via peripheral stick and fingerstick on the opposite hand simultaneously. If his serum BMP is running high and fingerstick is low, would use BMP as actual and discontinue D10 gtt. Possible pseudohypoglycemia although think this is less likely as mental status improved with dextrose.  - Until CBG stabilized off D10 would hold basal insulin, will need resumed likely today. Consider modest reduction to 40 units daily  - For mealtime insulin, currently held. Consider reduced dosing 25 units with breakfast and dinner  when restarted.  - SSI ordered currently for moderate, instructions to hold until off D10 with stable BG.  - PT / OT evaluation  - TOC involvement re: possible alternative SNF, family unhappy with care at current facility. Please speak with patients husband as well as he would like to be involved in discussion.  - With his encephalopathy, reduce Gabapentin to 300 mg nightly (home is 300 mg BID)   Left 1st proximal phalanx fracture, mildly displaced, initial encounter  XR with finding per above, new since fall on 11/20.  - For now, buddy tape 1st and 2nd toe with gauze in between, will need to change daily due to his hx neuropathy and risk for ulceration  - Routine ortho consult in AM, likely will need post op shoe if he is partial weight bearing on the LLE only (NWB on the RLE per below)   Recent open fracture R ankle s/p ORIF on 11/14  Open right trimalleolar ankle fracture dislocation including syndesmosis s/p I&D and ORIF of fibula, medial malleolus, posterior malleolus and syndesmosis on 11/14. - Splint to be converted to short leg cast at 2 wk post op.  - NWB x 8 weeks post op per Ortho; note per family due to limited DME at SNF and limited assistance he has unfortunately  been intermittently weight bearing for transfers / dressing at SNF.  - PT/OT per above, TOC re: SNF placement  - Continues on Eliquis for hx Afib and DVT ppx post operatively as well.  - Continue Vit D3 5000 IU daily   Hypokalemia, asymptomatic, repleting  - Hold home diuretic until K replete, likely resume diuretic tomorrow   Chronic medical problems:  Resolving AKI stage II on CKD stage III: Baseline Cr 1.8 - 2. Last admission peaked at 4.7, thought to be related to hypotension, NSAID, lasix, losartan I.e. prerenal. Back to 2.1 at time of admission. Losartan still on hold, can resume if BP remains high and not orthostatic to sitting position.  Anemia, secondary to acute blood loss, improving: Baseline Hb 13-14, nadir  of 8.6 last admission. Back to 10 this admission.  HTN, with hx orthostatic hypotension: Continue home Carvedilol. Losartan currently held from past admit, see AKI above. Diuretic on hold with hypokalemia, should resume likely tomorrow.   Hyperlipidemia: Continue rosuvastatin.   Hypothyroid: Recent TSH: 3.387, Continue home dose of Synthroid OSA on nightly CPAP/COPD: Continue CPAP Paroxysmal atrial fibrillation: Continue amiodarone, Eliquis CAD/PAD/lower extremity venous stasis ulcers: Hx CEA, cardiac and peripheral artery stents. Recently taken off Plavix and started on Eliquis.  Continue statins. Follows with outpatient wound care. Chronic diastolic CHF: TTE 02/2022: LVEF 50-55%. Diuretics currently held with hypokalemia, likely resume tomorrow once K replete   Anxiety and depression: Continue home Abilify and Cymbalta Ambulatory dysfunction/frequent falls/?  Orthostatic hypotension: Check orthostatics supine to seated only given WB status. Falls also in part related to neuropathy.   Body mass index is 41.83 kg/m./Morbid obesity Patient on Mounjaro as outpatient in efforts to lose weight.   Body mass index is 42.17 kg/m. Morbid obesity affecting medical care per above    DVT prophylaxis:  Eliquis Code Status:  Full Code Diet:  Diet Orders (From admission, onward)     Start     Ordered   01/09/23 0546  Diet Carb Modified Fluid consistency: Thin; Room service appropriate? Yes  Diet effective now       Question Answer Comment  Diet-HS Snack? Nothing   Calorie Level Medium 1600-2000   Fluid consistency: Thin   Room service appropriate? Yes      01/09/23 0555           Family Communication:  Yes discussed with his husband at the bedside   Consults:  None   Admission status:   Observation, Med-Surg  Severity of Illness: The appropriate patient status for this patient is OBSERVATION. Observation status is judged to be reasonable and necessary in order to provide the required  intensity of service to ensure the patient's safety. The patient's presenting symptoms, physical exam findings, and initial radiographic and laboratory data in the context of their medical condition is felt to place them at decreased risk for further clinical deterioration. Furthermore, it is anticipated that the patient will be medically stable for discharge from the hospital within 2 midnights of admission.    Dolly Rias, MD [facility]  How to contact the [facility] Attending or Consulting provider 7A - 7P or covering provider during after hours 7P -7A, for this patient.  Check the care team in [facility] and look for a) attending/consulting [facility] provider listed and b) the [facility] team listed Log into [email or web address] and use [place]'s universal password to access. If you do not have the password, please contact the hospital operator. Locate the [facility] provider you are looking for under  [facility] and page to a number that you can be directly reached. If you still have difficulty reaching the provider, please page the [facility] (Director on Call) for the Hospitalists listed on amion for assistance.  01/09/2023, 6:01 AM

## 2023-01-09 NOTE — Evaluation (Signed)
Physical Therapy Evaluation Patient Details Name: Harold [name] MRN: [id number] DOB: 06-Apr-1955 Today's Date: 01/09/2023  History of Present Illness  Pt is a 67 y/o male who recently had an admission on 11/14 after a fall and had a R ankle ORIF with NWB. Pt now was admitted on 01/08/23 due to another fall at [facility]. He was found to have a L 1st proximal phalanx fx. Pt Significant PMH: HTN, HLD, CAD s/p PCI, COPD, DM2, CKD stage IIIb, PVD, gout, OSA.  Clinical Impression  Pt presents with admitting diagnosis above. Co-treat with OT. Pt today was able to sit EOB with supervision however unable to complete AP transfer to chair with +2 Max A. Further transfer deferred due to pt being noted with broken toe on L side with order with post op shoe however no post op shoe has been delivered. Patient will benefit from continued inpatient follow up therapy, <3 hours/day however pt states that he does not want to return to same facility. PT will continue to follow.       If plan is discharge home, recommend the following: Two people to help with walking and/or transfers;A lot of help with bathing/dressing/bathroom   Can travel by private vehicle   No    Equipment Recommendations Other (comment) (Per accepting facility)  Recommendations for Other Services       Functional Status Assessment Patient has had a recent decline in their functional status and demonstrates the ability to make significant improvements in function in a reasonable and predictable amount of time.     Precautions / Restrictions Precautions Precautions: Fall Precaution Comments: multiple falls at SNF reported Required Braces or Orthoses: Splint/Cast Splint/Cast: hard, on RLE Restrictions Weight Bearing Restrictions: Yes RLE Weight Bearing: Non weight bearing Other Position/Activity Restrictions: partial weight bearing on the LLE with post op shoe, ortho was consulted      Mobility  Bed Mobility Overal bed mobility:  Needs Assistance Bed Mobility: Supine to Sit     Supine to sit: Supervision, HOB elevated Sit to supine: Supervision, HOB elevated   General bed mobility comments: no physical assist required    Transfers                   General transfer comment: attempted to complete A-P transfer but unable to complete with maxx 2 assist    Ambulation/Gait                  Stairs            Wheelchair Mobility     Tilt Bed    Modified Rankin (Stroke Patients Only)       Balance Overall balance assessment: Needs assistance Sitting-balance support: Feet supported Sitting balance-Leahy Scale: Good Sitting balance - Comments: sitting EOB     Standing balance-Leahy Scale: Zero                               Pertinent Vitals/Pain Pain Assessment Pain Assessment: 0-10 Pain Score: 6  Pain Location: R ankle Pain Descriptors / Indicators: Discomfort, Grimacing, Guarding Pain Intervention(s): Limited activity within patient's tolerance, Monitored during session    Home Living Family/patient expects to be discharged to:: Private residence Living Arrangements: Spouse/significant other Available Help at Discharge: Family Type of Home: House Home Access: Stairs to enter Entrance Stairs-Rails: None Entrance Stairs-Number of Steps: 3   Home Layout: One level Home Equipment: Information systems manager  Prior Function Prior Level of Function : History of Falls (last six months);Needs assist             Mobility Comments: no AD, hx 20-30 falls ADLs Comments: reports needing assist for LB ADLs from significant other, supervision for bathing and assist for tub transfers     Extremity/Trunk Assessment   Upper Extremity Assessment Upper Extremity Assessment: Generalized weakness    Lower Extremity Assessment Lower Extremity Assessment: RLE deficits/detail;LLE deficits/detail RLE Deficits / Details: R ankle fx s/p ORIF LLE Deficits / Details: L 1st toe  fx       Communication   Communication Communication: No apparent difficulties  Cognition Arousal: Alert Behavior During Therapy: WFL for tasks assessed/performed Overall Cognitive Status: Impaired/Different from baseline Area of Impairment: Safety/judgement                         Safety/Judgement: Decreased awareness of safety              General Comments General comments (skin integrity, edema, etc.): VSS on RA    Exercises     Assessment/Plan    PT Assessment Patient needs continued PT services  PT Problem List Decreased strength;Decreased activity tolerance;Decreased balance;Decreased mobility;Pain       PT Treatment Interventions DME instruction;Functional mobility training;Therapeutic activities;Therapeutic exercise;Balance training;Patient/family education    PT Goals (Current goals can be found in the Care Plan section)       Frequency Min 1X/week     Co-evaluation PT/OT/SLP Co-Evaluation/Treatment: Yes Reason for Co-Treatment: For patient/therapist safety;To address functional/ADL transfers PT goals addressed during session: Mobility/safety with mobility;Balance;Proper use of DME OT goals addressed during session: ADL's and self-care       AM-PAC PT "6 Clicks" Mobility  Outcome Measure Help needed turning from your back to your side while in a flat bed without using bedrails?: A Little Help needed moving from lying on your back to sitting on the side of a flat bed without using bedrails?: A Little Help needed moving to and from a bed to a chair (including a wheelchair)?: Total Help needed standing up from a chair using your arms (e.g., wheelchair or bedside chair)?: Total Help needed to walk in hospital room?: Total Help needed climbing 3-5 steps with a railing? : Total 6 Click Score: 10    End of Session Equipment Utilized During Treatment: Gait belt Activity Tolerance: Patient tolerated treatment well Patient left: in bed;with call  bell/phone within reach;with family/visitor present Nurse Communication: Mobility status PT Visit Diagnosis: Other abnormalities of gait and mobility (R26.89)    Time: 1610-9604 PT Time Calculation (min) (ACUTE ONLY): 30 min   Charges:   PT Evaluation $PT Eval Moderate Complexity: 1 Mod   PT General Charges $$ ACUTE PT VISIT: 1 Visit         Shela Nevin, PT, DPT Acute Rehab Services [phone]   Gladys Damme 01/09/2023, 12:10 PM

## 2023-01-09 NOTE — [place] FL2 (Signed)
[place] MEDICAID [id number] LEVEL OF CARE FORM     IDENTIFICATION  Patient Name: Harold [name] Birthdate: 13-Jan-1956 Sex: male Admission Date (Current Location): 01/08/2023  [facility] and [place] Number:  Producer, television/film/video and Address:  [facility] [place], [address], [place], [place] [address]      Provider Number: [id number]  Attending Physician Name and Address:  [place], Eric J, DO  Relative Name and Phone Number:  Benton-Elliot,George Spouse [phone]    Current Level of Care: Hospital Recommended Level of Care: Skilled Nursing Facility Prior Approval Number:    Date Approved/Denied:   PASRR Number: [id number]  Discharge Plan: SNF    Current Diagnoses: Patient Active Problem List   Diagnosis Date Noted   Hypoglycemia due to insulin 01/09/2023   Toe fracture, left 01/09/2023   Ground-level fall 01/09/2023   Acute metabolic encephalopathy 01/09/2023   Hypokalemia 01/09/2023   Vitamin D deficiency 01/07/2023   Open ankle fracture 01/02/2023   Prolonged QT interval 01/02/2023   Fall at home, initial encounter 01/02/2023   Leukocytosis 01/02/2023   Acute kidney injury superimposed on chronic kidney disease (HCC) 01/02/2023   Normocytic anemia 01/02/2023   Paroxysmal atrial fibrillation (HCC) 01/02/2023   Uncontrolled type 2 diabetes mellitus with hyperglycemia, with long-term current use of insulin (HCC) 01/02/2023   Hypothyroidism 01/02/2023   CKD stage 3b, GFR 30-44 ml/min (HCC) 11/07/2022   CAD in native artery 11/07/2022   PVD (peripheral vascular disease) (HCC) 11/06/2022   Hypercoagulable state due to persistent atrial fibrillation (HCC) 02/19/2022   Persistent atrial fibrillation (HCC) 01/28/2022   Lumbar stenosis with neurogenic claudication 07/26/2020   Diabetic neuropathy (HCC) 08/24/2019   Contusion of right elbow 09/02/2018   Trigger thumb of left hand 09/02/2018   Atherosclerosis of native artery of both lower  extremities with intermittent claudication (HCC) 05/12/2018   Pain of left hand 03/26/2018   Trigger finger 03/03/2017   Pain in finger of left hand 03/03/2017   Snoring 08/20/2016   Morbid (severe) obesity due to excess calories (HCC) 04/18/2016   Venous stasis of both lower extremities - with edema 02/25/2015   Right-sided chest wall pain 05/08/2014   Gastroesophageal reflux disease without esophagitis 10/10/2013   Chronic heart failure with preserved ejection fraction (HFpEF) (HCC) 06/27/2013   COPD (chronic obstructive pulmonary disease) (HCC) 06/17/2013   Restrictive lung disease 06/17/2013   Myalgia and myositis 04/20/2013   Aftercare following surgery of the circulatory system, NEC 02/22/2013   Obesity, Class III, BMI 40-49.9 (morbid obesity) (HCC) 09/01/2012   Fatigue 09/01/2012   Preoperative cardiovascular examination 09/01/2012   Chronic renal insufficiency, stage II (mild) 04/30/2012   OSA on CPAP 04/29/2012   Pulmonary hypertension, 04/29/2012   Dyspnea on exertion   04/26/2012   CAD, LAD/CFX DES 04/28/12- staged RCA DES 04/29/12 after pt declined CABG, cath 05/14/13 stable CAD medical therapy    Carotid artery stenosis without cerebral infarction, bilateral 11/07/2011   Diabetes mellitus type 2 with neurological manifestations (HCC) 04/29/2011   Essential hypertension 04/29/2011   Neuropathy 04/29/2011   Hyperlipidemia associated with type 2 diabetes mellitus (HCC) 04/29/2011   Anxiety and depression 04/29/2011    Orientation RESPIRATION BLADDER Height & Weight     Self, Time, Situation, Place  Normal Continent Weight: (!) 319 lb 10.7 oz (145 kg) Height:  6\' 1"  (185.4 cm)  BEHAVIORAL SYMPTOMS/MOOD NEUROLOGICAL BOWEL NUTRITION STATUS      Continent Diet (see discharge summary)  AMBULATORY STATUS COMMUNICATION  OF NEEDS Skin   Total Care Verbally Normal, Surgical wounds                       Personal Care Assistance Level of Assistance  Bathing, Feeding,  Dressing Bathing Assistance: Maximum assistance Feeding assistance: Independent Dressing Assistance: Maximum assistance     Functional Limitations Info  Sight, Hearing, Speech Sight Info: Adequate Hearing Info: Adequate Speech Info: Adequate    SPECIAL CARE FACTORS FREQUENCY  PT (By licensed PT), OT (By licensed OT)     PT Frequency: 5x week OT Frequency: 5x week            Contractures Contractures Info: Not present    Additional Factors Info  Code Status, Allergies, Insulin Sliding Scale Code Status Info: full Allergies Info: Septra (Bactrim), Penicillins, Metformin And Related, Other, Sulfamethoxazole-trimethoprim, Wellbutrin (Bupropion), Doxycycline   Insulin Sliding Scale Info: Novolog: see discharge summary       Current Medications (01/09/2023):  This is the current hospital active medication list Current Facility-Administered Medications  Medication Dose Route Frequency Provider Last Rate Last Admin   acetaminophen (TYLENOL) tablet 1,000 mg  1,000 mg Oral Q6H PRN Dolly Rias, MD   1,000 mg at 01/09/23 1316   allopurinol (ZYLOPRIM) tablet 100 mg  100 mg Oral Daily Dolly Rias, MD   100 mg at 01/09/23 4098   amiodarone (PACERONE) tablet 200 mg  200 mg Oral Daily Dolly Rias, MD   200 mg at 01/09/23 1191   apixaban (ELIQUIS) tablet 5 mg  5 mg Oral BID Dolly Rias, MD   5 mg at 01/09/23 4782   ARIPiprazole (ABILIFY) tablet 5 mg  5 mg Oral Daily Dolly Rias, MD   5 mg at 01/09/23 0829   carvedilol (COREG) tablet 3.125 mg  3.125 mg Oral BID WC Dolly Rias, MD   3.125 mg at 01/09/23 9562   cholecalciferol (VITAMIN D3) 25 MCG (1000 UNIT) tablet 125 mcg  125 mcg Oral Daily Dolly Rias, MD   125 mcg at 01/09/23 0842   dextrose 10 % infusion   Intravenous Continuous Dolly Rias, MD 125 mL/hr at 01/09/23 0558 New Bag at 01/09/23 0558   DULoxetine (CYMBALTA) DR capsule 60 mg  60 mg Oral Daily Dolly Rias, MD   60 mg at 01/09/23 1308    ezetimibe (ZETIA) tablet 10 mg  10 mg Oral Daily Dolly Rias, MD   10 mg at 01/09/23 0827   gabapentin (NEURONTIN) capsule 300 mg  300 mg Oral QHS Segars, Christiane Ha, MD       insulin aspart (novoLOG) injection 0-15 Units  0-15 Units Subcutaneous TID WC Dolly Rias, MD   2 Units at 01/09/23 1233   levothyroxine (SYNTHROID) tablet 88 mcg  88 mcg Oral QAC breakfast Dolly Rias, MD   88 mcg at 01/09/23 6578   Oral care mouth rinse  15 mL Mouth Rinse PRN [place], Alvira Philips, DO       oxyCODONE (Oxy IR/ROXICODONE) immediate release tablet 2.5 mg  2.5 mg Oral Q4H PRN [place], Eric J, DO       Or   oxyCODONE (Oxy IR/ROXICODONE) immediate release tablet 5 mg  5 mg Oral Q4H PRN [place], Eric J, DO       oxyCODONE (Oxy IR/ROXICODONE) immediate release tablet 5 mg  5 mg Oral Once [place], Eric J, DO       pantoprazole (PROTONIX) EC tablet 40 mg  40 mg Oral Daily Dolly Rias, MD   40 mg  at 01/09/23 0827   polyethylene glycol (MIRALAX / GLYCOLAX) packet 17 g  17 g Oral Daily PRN Dolly Rias, MD       rosuvastatin (CRESTOR) tablet 20 mg  20 mg Oral QPM Segars, Christiane Ha, MD       sodium chloride flush (NS) 0.9 % injection 3 mL  3 mL Intravenous Q12H Cardama, Amadeo Garnet, MD   3 mL at 01/09/23 1000   sodium chloride flush (NS) 0.9 % injection 3 mL  3 mL Intravenous PRN Cardama, Amadeo Garnet, MD         Discharge Medications: Please see discharge summary for a list of discharge medications.  Relevant Imaging Results:  Relevant Lab Results:   Additional Information SSN [id number]  Lorri Frederick, LCSW

## 2023-01-09 NOTE — Progress Notes (Signed)
Orthopedic Tech Progress Note Patient Details:  TORON BALENT 06-05-1955 [id number]  Ortho Devices Type of Ortho Device: Postop shoe/boot Ortho Device/Splint Location: LLE Ortho Device/Splint Interventions: Ordered, Adjustment   Post Interventions Patient Tolerated: Well  Alaster Asfaw A Sapphira Harjo 01/09/2023, 12:32 PM

## 2023-01-09 NOTE — ED Triage Notes (Addendum)
Pt BIB [facility] from [facility]. Per facility staff pt rolled out of the bed, unwitnessed. Unknown if pt hit head. Pt takes Eliquis. On EMS arrival pts CBG 45, and given D10 raising CBG to 156. On arrival to this facility pts CBG was 35.

## 2023-01-09 NOTE — ED Notes (Signed)
ED TO INPATIENT HANDOFF REPORT  ED Nurse Name and Phone #: Lanora Manis [phone]  S Name/Age/Gender Harold [name] 67 y.o. male Room/Bed: 010C/010C  Code Status   Code Status: Prior  Home/SNF/Other Rehab Patient oriented to: self, place, time, and situation Is this baseline? Yes   Triage Complete: Triage complete  Chief Complaint Hypoglycemia due to insulin [E16.0, T38.3X5A]  Triage Note Pt BIB [facility] from [facility]. Per facility staff pt rolled out of the bed, unwitnessed. Unknown if pt hit head. Pt takes Eliquis. On EMS arrival pts CBG 45, and given D10 raising CBG to 156. On arrival to this facility pts CBG was 35.    Allergies Allergies  Allergen Reactions   Septra [Bactrim] Itching   Penicillins Rash    Allergy to All cillin drugs  Ancef given 9/24 with no obvious reaction   Metformin And Related Diarrhea and Nausea Only   Other Nausea And Vomiting    General Anesthesia - sometimes causes nausea and vomiting * OK to use scopolamine patch     Sulfamethoxazole-Trimethoprim Other (See Comments)   Wellbutrin [Bupropion]     Mood Changes   Doxycycline Hives and Rash    Level of Care/Admitting Diagnosis ED Disposition     ED Disposition  Admit   Condition  --   Comment  Hospital Area: [facility] [[id number]]  Level of Care: Med-Surg [16]  May place patient in observation at [facility] or [facility] if equivalent level of care is available:: No  Covid Evaluation: Asymptomatic - no recent exposure (last 10 days) testing not required  Diagnosis: Hypoglycemia due to insulin [244010]  Admitting Physician: Dolly Rias [[id number]]  Attending Physician: Dolly Rias [[id number]]          B Medical/Surgery History Past Medical History:  Diagnosis Date   Anginal pain (HCC) 04/2013   Cardiac cath showed patent stents with distal LAD, circumflex-OM and RCA disease in small vessels.   Anxiety    Atrial fibrillation (HCC) 12/2021   CAD  S/P percutaneous coronary angioplasty 2003, 04/2012   status post PCI to LAD, circumflex-OM 2, RCA   Carotid artery occlusion    Chronic renal insufficiency, stage II (mild)    Chronic venous insufficiency    varicosities, no reflux; dopplers 04/14/12- valvular insufficiency in the R and L GSV   Complication of anesthesia    COPD, mild (HCC)    Depression    situaltional    Diabetes mellitus    Diabetic neuropathy (HCC) 08/24/2019   Dyslipidemia associated with type 2 diabetes mellitus (HCC)    GERD (gastroesophageal reflux disease)    History of cardioversion 12/2021   at [facility]   HTN (hypertension)    Hypothyroidism    Neuromuscular disorder (HCC)    neuropathy in feet   Neuropathy    notably improved following PCI with improved cardiac function   Obesity    Obesity, Class II, BMI 35.0-39.9, with comorbidity (see actual BMI)    BMI 39; wgt loss efforts in place; seeing Dietician   PONV (postoperative nausea and vomiting)    "Patch Works"   Pulmonary hypertension (HCC) 04/2012   PA pressure   Sleep apnea    uses nightly   Past Surgical History:  Procedure Laterality Date   ABDOMINAL AORTAGRAM N/A 11/15/2011   Procedure: ABDOMINAL Ronny Flurry;  Surgeon: Sherren Kerns, MD;  Location: [facility] CATH LAB;  Service: Cardiovascular;  [name];   ABDOMINAL AORTOGRAM W/LOWER EXTREMITY N/A 10/13/2019  Procedure: ABDOMINAL AORTOGRAM W/LOWER EXTREMITY;  Surgeon: Iran Ouch, MD;  Location: [facility] INVASIVE CV LAB;  Service: CV: Non-obst Ao-Iliac. R SFA-PopA patent with significant calcific stenosis of TP trunk-prox PTA (dominant) & CTO ATA  -> R PTA-TP PTCA   ABDOMINAL AORTOGRAM W/LOWER EXTREMITY N/A 11/06/2022   Procedure: ABDOMINAL AORTOGRAM W/LOWER EXTREMITY;  Surgeon: Iran Ouch, MD;  Location: [facility] INVASIVE CV LAB;  Service: Cardiovascular;  [name];   ABIs  04/27/2012   mild bilateral arterial insufficiency   BACK SURGERY  2005 x1   X2-2010    BUNIONECTOMY Right 11/11/2013   Procedure: RIGHT FOOT SILVER BUNIONECTOMY;  Surgeon: Toni Arthurs, MD;  Location:  SURGERY CENTER;  Service: Orthopedics;  Laterality: Right;   CARDIAC CATHETERIZATION  12/11/2001   significant 3V CAD, normal LV function   CARDIOVERSION N/A 03/13/2022   Procedure: CARDIOVERSION;  Surgeon: Little Ishikawa, MD;  Location: [facility] ENDOSCOPY;  Service: Cardiovascular;  [name];   CARDIOVERSION N/A 07/25/2022   Procedure: CARDIOVERSION;  Surgeon: Jake Bathe, MD;  Location: [facility] INVASIVE CV LAB;  Service: Cardiovascular;  [name];   Carotid Doppler  04/27/2012   right internal carotid: Elevated velocities but no evidence of plaque. Left internal carotid 40-59%   CAROTID ENDARTERECTOMY  2005   Right; recent carotid Dopplers notes elevated velocities.    colonscopy     CORONARY ANGIOPLASTY  12/21/2001   PTCA of the distal and mid AV groove circ, unsuccessful PTCA of second OM total occlusion, unsuccessful PTCA of the apical LAD total occlusion   CORONARY ANGIOPLASTY WITH STENT PLACEMENT  04/29/2012   PCI to 3 RCA lesions, Promus Premiere 2.63mmx8mm distally, mid was 2.62mx28mm and proximally 2.75x14mm, EF 55-60%   CORONARY STENT PLACEMENT  04/28/2012   PCI to LAD (3x32mm Xience DES postdilated to 3.25) and circ prox and mid (2 overlappinmg 2.2mmx12mmXience DES postdilated to 2.5mm)   DOPPLER ECHOCARDIOGRAPHY  04/28/2012   poor quality study: EF estimated 60-65%; unable to assess diastolic function (previously noted to have diastolic dysfunction); severely dilated left atrium and mild right atrium; dilated IVC consistent with increased central venous pressure.Marland Kitchen   HERNIA REPAIR     I & D EXTREMITY Right 01/02/2023   Procedure: IRRIGATION AND DEBRIDEMENT RIGHT ANKLE;  Surgeon: [name], MD;  [name];  Service: Orthopedics;  Laterality: Right;   LEFT AND RIGHT HEART CATHETERIZATION WITH CORONARY ANGIOGRAM N/A 04/27/2012    Procedure: LEFT AND RIGHT HEART CATHETERIZATION WITH CORONARY ANGIOGRAM;  Surgeon: Marykay Lex, MD;  Location: [facility] CATH LAB;  Service: Cardiovascular;  [name];   LEFT AND RIGHT HEART CATHETERIZATION WITH CORONARY ANGIOGRAM N/A 05/14/2013   Procedure: LEFT AND RIGHT HEART CATHETERIZATION WITH CORONARY ANGIOGRAM;  Surgeon: Lennette Bihari, MD;  [name]; PCWP ;; [name], [name], Patent RCA ostial mid and distal stents - 70% distal runoff Dz   LUMBAR LAMINECTOMY/DECOMPRESSION MICRODISCECTOMY  01/07/2012   Procedure: LUMBAR LAMINECTOMY/DECOMPRESSION MICRODISCECTOMY 1 LEVEL;  Surgeon: Carmela Hurt, MD;  [name];  Service: Neurosurgery;  Laterality: Bilateral;   Lumbar Three-Four Decompression   LUMBAR LAMINECTOMY/DECOMPRESSION MICRODISCECTOMY N/A 07/26/2020   Procedure: Lumbar two-three Laminectomy/Foraminotomy;  Surgeon: Coletta Memos, MD;  [name];  Service: Neurosurgery;  [name];   ORIF ANKLE FRACTURE Right 01/02/2023   Procedure: OPEN REDUCTION INTERNAL FIXATION (ORIF)RIGHT  ANKLE FRACTURE;  Surgeon: [name], MD;  Location: [facility] OR;  Service: Orthopedics;  Laterality: Right;   PERCUTANEOUS CORONARY STENT INTERVENTION (PCI-S) N/A 04/28/2012   Procedure: PERCUTANEOUS CORONARY STENT INTERVENTION (PCI-S);  Surgeon: Lennette Bihari, MD;  Location: [facility] CATH LAB;  Service: Cardiovascular;  [name];   PERCUTANEOUS CORONARY STENT INTERVENTION (PCI-S) N/A 04/29/2012   Procedure: PERCUTANEOUS CORONARY STENT INTERVENTION (PCI-S);  Surgeon: Marykay Lex, MD;  Location: [facility] CATH LAB;  Service: Cardiovascular;  [name];   PERIPHERAL VASCULAR ATHERECTOMY  11/06/2022   Procedure: PERIPHERAL VASCULAR ATHERECTOMY;  Surgeon: Iran Ouch, MD;  [facility] IN[facility] CV LAB;  Service: Cardiovascular;;    PERIPHERAL VASCULAR BALLOON ANGIOPLASTY Right 10/13/2019   Procedure: PERIPHERAL VASCULAR BALLOON ANGIOPLASTY;  Surgeon: Iran Ouch, MD;  [facility]VAS[facility] CV LAB;  Service: Cardiovascular;  Laterality: Right;  PTCA of R TP Trunk into PTA (Drug-coated) => Follow-up ABIs 0.9 on the right and 0.99 on the left.   SPINE SURGERY     UMBILICAL HERNIA REPAIR  2009   steel mesh insert     A IV Location/Drains/Wounds Patient Lines/Drains/Airways Status     Active Line/Drains/Airways     Name Placement date Placement time Site Days   Peripheral IV 01/08/23 20 G Anterior;Left Hand 01/08/23  --  Hand  1   Wound / Incision (Open or Dehisced) 11/06/22 Foot Anterior;Right 11/06/22  1801  Foot  64   Wound / Incision (Open or Dehisced) 11/06/22 Foot Anterior;Left 11/06/22  1804  Foot  64            Intake/Output Last 24 hours  Intake/Output Summary (Last 24 hours) at 01/09/2023 0535 Last data filed at 01/09/2023 0272 Gross per 24 hour  Intake 453.54 ml  Output --  Net 453.54 ml    Labs/Imaging Results for orders placed or performed during the hospital encounter of 01/08/23 (from the past 48 hour(s))  CBG monitoring, ED     Status: Abnormal   Collection Time: 01/08/23 11:50 PM  Result Value Ref Range   Glucose-Capillary 35 (LL) 70 - 99 mg/dL    Comment: Glucose reference range applies only to samples taken after fasting for at least 8 hours.   Comment 1 Notify RN   Comprehensive metabolic panel     Status: Abnormal   Collection Time: 01/08/23 11:56 PM  Result Value Ref Range   Sodium 139 135 - 145 mmol/L   Potassium 3.0 (L) 3.5 - 5.1 mmol/L   Chloride 103 98 - 111 mmol/L   CO2 26 22 - 32 mmol/L   Glucose, Bld 662 (HH) 70 - 99 mg/dL    Comment: CRITICAL RESULT CALLED TO, READ BACK BY AND VERIFIED WITH W. EZE RN 01/09/23 @0040  BY J. WHITE Glucose reference range applies only to samples taken after fasting for at least 8 hours.    BUN 32 (H) 8 - 23 mg/dL   Creatinine, Ser  5.36 (H) 0.61 - 1.24 mg/dL   Calcium 8.6 (L) 8.9 - 10.3 mg/dL   Total Protein 5.7 (L) 6.5 - 8.1 g/dL   Albumin 3.0 (L) 3.5 - 5.0 g/dL   AST 29 15 - 41 U/L   ALT 16 0 - 44 U/L   Alkaline Phosphatase 68 38 - 126 U/L   Total Bilirubin 0.7 <1.2 mg/dL   GFR, Estimated 32 (L) >60 mL/min    Comment: (NOTE) Calculated using the CKD-EPI Creatinine Equation (2021)    Anion gap 10 5 - 15  Comment: Performed at [facility], [address], [place], [place] [address]  CBC with Differential     Status: Abnormal   Collection Time: 01/08/23 11:56 PM  Result Value Ref Range   WBC 9.4 4.0 - 10.5 K/uL   RBC 3.30 (L) 4.22 - 5.81 MIL/uL   Hemoglobin 10.3 (L) 13.0 - 17.0 g/dL   HCT 60.4 (L) 54.0 - 98.1 %   MCV 91.8 80.0 - 100.0 fL   MCH 31.2 26.0 - 34.0 pg   MCHC 34.0 30.0 - 36.0 g/dL   RDW 19.1 47.8 - 29.5 %   Platelets 220 150 - 400 K/uL   nRBC 0.0 0.0 - 0.2 %   Neutrophils Relative % 72 %   Neutro Abs 7.0 1.7 - 7.7 K/uL   Lymphocytes Relative 14 %   Lymphs Abs 1.3 0.7 - 4.0 K/uL   Monocytes Relative 9 %   Monocytes Absolute 0.8 0.1 - 1.0 K/uL   Eosinophils Relative 3 %   Eosinophils Absolute 0.3 0.0 - 0.5 K/uL   Basophils Relative 1 %   Basophils Absolute 0.1 0.0 - 0.1 K/uL   Immature Granulocytes 1 %   Abs Immature Granulocytes 0.05 0.00 - 0.07 K/uL    Comment: Performed at [facility], [address] [place], [place] [address]  CK     Status: None   Collection Time: 01/08/23 11:56 PM  Result Value Ref Range   Total CK 207 49 - 397 U/L    Comment: Performed at [facility], [address] [place], [place] [address]  CBG monitoring, ED (now and then every hour for 3 hours)     Status: Abnormal   Collection Time: 01/09/23 12:04 AM  Result Value Ref Range   Glucose-Capillary 141 (H) 70 - 99 mg/dL    Comment: Glucose reference range applies only to samples taken after fasting for at least 8 hours.  I-Stat Chem 8, ED ([facility], [facility], [facility] only)     Status: Abnormal    Collection Time: 01/09/23 12:04 AM  Result Value Ref Range   Sodium 140 135 - 145 mmol/L   Potassium 2.9 (L) 3.5 - 5.1 mmol/L   Chloride 100 98 - 111 mmol/L   BUN 31 (H) 8 - 23 mg/dL   Creatinine, Ser 4.69 (H) 0.61 - 1.24 mg/dL   Glucose, Bld 629 (HH) 70 - 99 mg/dL    Comment: Glucose reference range applies only to samples taken after fasting for at least 8 hours.   Calcium, Ion 1.10 (L) 1.15 - 1.40 mmol/L   TCO2 25 22 - 32 mmol/L   Hemoglobin 10.2 (L) 13.0 - 17.0 g/dL   HCT 52.8 (L) 41.3 - 24.4 %   Comment NOTIFIED PHYSICIAN   CBG monitoring, ED (now and then every hour for 3 hours)     Status: Abnormal   Collection Time: 01/09/23 12:21 AM  Result Value Ref Range   Glucose-Capillary 242 (H) 70 - 99 mg/dL    Comment: Glucose reference range applies only to samples taken after fasting for at least 8 hours.  CBG monitoring, ED (now and then every hour for 3 hours)     Status: None   Collection Time: 01/09/23  1:52 AM  Result Value Ref Range   Glucose-Capillary 95 70 - 99 mg/dL    Comment: Glucose reference range applies only to samples taken after fasting for at least 8 hours.  CBG monitoring, ED     Status:  None   Collection Time: 01/09/23  4:29 AM  Result Value Ref Range   Glucose-Capillary 81 70 - 99 mg/dL    Comment: Glucose reference range applies only to samples taken after fasting for at least 8 hours.   *Note: Due to a large number of results and/or encounters for the requested time period, some results have not been displayed. A complete set of results can be found in Results Review.   DG Foot Complete Left  Result Date: 01/09/2023 CLINICAL DATA:  Fall, left foot pain EXAM: LEFT FOOT - COMPLETE 3+ VIEW COMPARISON:  09/09/2019 FINDINGS: Fracture noted at the base of the left 1st proximal phalanx. Fracture fragment mildly displaced. Degenerative changes at the 1st MTP joint and 1st IP joint. No subluxation or dislocation. Plantar calcaneal spur. Soft tissues are intact.  IMPRESSION: Fracture at the base of the left great toe proximal phalanx. Electronically Signed   By: Charlett Nose M.D.   On: 01/09/2023 02:02   CT Cervical Spine Wo Contrast  Result Date: 01/09/2023 CLINICAL DATA:  Fall, on blood thinners EXAM: CT CERVICAL SPINE WITHOUT CONTRAST TECHNIQUE: Multidetector CT imaging of the cervical spine was performed without intravenous contrast. Multiplanar CT image reconstructions were also generated. RADIATION DOSE REDUCTION: This exam was performed according to the departmental dose-optimization program which includes automated exposure control, adjustment of the mA and/or kV according to patient size and/or use of iterative reconstruction technique. COMPARISON:  None available FINDINGS: Alignment: Normal. Skull base and vertebrae: No acute fracture. No primary bone lesion or focal pathologic process. Soft tissues and spinal canal: No prevertebral fluid or swelling. No visible canal hematoma. Disc levels: Early anterior spurring in the mid and lower cervical spine. Mild degenerative facet disease bilaterally. Upper chest: No acute findings Other: None IMPRESSION: No acute bony abnormality. Electronically Signed   By: Charlett Nose M.D.   On: 01/09/2023 01:08   CT Head Wo Contrast  Result Date: 01/09/2023 CLINICAL DATA:  Fall, on blood thinners. EXAM: CT HEAD WITHOUT CONTRAST TECHNIQUE: Contiguous axial images were obtained from the base of the skull through the vertex without intravenous contrast. RADIATION DOSE REDUCTION: This exam was performed according to the departmental dose-optimization program which includes automated exposure control, adjustment of the mA and/or kV according to patient size and/or use of iterative reconstruction technique. COMPARISON:  None Available. FINDINGS: Brain: Diffuse cerebral atrophy. No acute intracranial abnormality. Specifically, no hemorrhage, hydrocephalus, mass lesion, acute infarction, or significant intracranial injury.  Vascular: No hyperdense vessel or unexpected calcification. Skull: No acute calvarial abnormality. Sinuses/Orbits: No acute findings Other: None IMPRESSION: Atrophy.  No acute intracranial abnormality. Electronically Signed   By: Charlett Nose M.D.   On: 01/09/2023 01:07    Pending Labs Unresulted Labs (From admission, onward)     Start     Ordered   01/08/23 2356  Urinalysis, Routine w reflex microscopic -Urine, Clean Catch  Once,   URGENT       Question:  Specimen Source  Answer:  Urine, Clean Catch   01/08/23 2356            Vitals/Pain Today's Vitals   01/09/23 0230 01/09/23 0400 01/09/23 0430 01/09/23 0439  BP: (!) 186/65 (!) 172/70 (!) 155/71   Pulse: (!) 57 (!) 57 62 60  Resp:   17 18  Temp:   98.1 F (36.7 C)   TempSrc:   Oral   SpO2: 97% 99% 100% 100%  Weight:      Height:  PainSc:        Isolation Precautions No active isolations  Medications Medications  sodium chloride flush (NS) 0.9 % injection 3 mL (3 mLs Intravenous Not Given 01/09/23 0033)  sodium chloride flush (NS) 0.9 % injection 3 mL (has no administration in time range)  dextrose 10 % infusion (0 mLs Intravenous Stopped 01/09/23 0513)  dextrose 50 % solution (50 mLs  Given 01/08/23 2354)  dextrose 10 % infusion (0 mLs Intravenous Stopped 01/09/23 0100)    Mobility manual wheelchair     Focused Assessments Neuro Assessment Handoff:           Neuro Assessment: Within Defined Limits Neuro Checks:      Has TPA been given? No If patient is a Neuro Trauma and patient is going to OR before floor call report to 4N Charge nurse: [phone] or [phone]   R Recommendations: See Admitting Provider Note  Report given to:   Additional Notes:  a and o x 4

## 2023-01-09 NOTE — Progress Notes (Signed)
   01/09/23 0439  BiPAP/CPAP/SIPAP  $ Non-Invasive Home Ventilator  Initial  $ Face Mask Medium Yes  BiPAP/CPAP/SIPAP Pt Type Adult  BiPAP/CPAP/SIPAP Resmed  Mask Type Full face mask  Mask Size Medium  EPAP 5 cmH2O (per pt and significant other. they both stated his home pressure is 5)  FiO2 (%) 21 %  Patient Home Equipment No  Auto Titrate No  BiPAP/CPAP /SiPAP Vitals  Pulse Rate 60  Resp 18  SpO2 100 %  Bilateral Breath Sounds Clear  MEWS Score/Color  MEWS Score 0  MEWS Score Color Green

## 2023-01-09 NOTE — Progress Notes (Signed)
PROGRESS NOTE    Harold [name]  YQM:[id number] DOB: 10/04/1955 DOA: 01/08/2023 PCP: [name], MD    Brief Narrative:   Harold [name] is a 67 y.o. 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  He was noted to be hypoglycemics with glucose in the 40's. Patient reports decreased PO intake at SNF due to quality of the food there. Evening prior to admission, he ate a [place] sandwich which is less than typical, and thinks he received full dose of Humulin R 150 units with this. Had also received normal dose of Tresiba 50 units in the morning. Felt normal around 7PM. His roommate at SNF noted that he had fallen out of bed and they called RN but were unable to have someone come to room until roommate wheeled himself out of room to get help. At time of EMS arrival BG in 40s and treated with D50. Patient feels his confusion is improving with treatment with D50 and D10 here in the ED. Overall no other changes in meds or symptomatically since going to SNF. Unhappy with the care there and wanting to speak with SW about potential change in SNF or options available.   Assessment & Plan:    Hypoglycemia associated with insulin use Hx DM type 2  Acute Metabolic encephalopathy secondary to hypoglycemia Hx recurrent falls out of bed, nature of fall PTA unknown due to patients AMS around this time. Found on the ground by roommate at SNF. On EMS arrival BG in 40s, and treated with D50. Has continued hypoglycemia via  fingerstick in ED and started on D10 gtt. His mental status has improved with treatment of hypoglycemia. Home insulin regimen is Tresiba 50 units in AM, Humulin R U500 50 U with breakfast and 150 U with dinner. Reason for hypoglycemia likely decreased PO intake along with continued high insulin dosage.  -- Continue D10 gtt at 125 cc/hr for now  -- Continue to monitor glucose closely and will continue to attempts at titrating off of D10 drip -- Diabetic educator consult -- Gabapentin reduced to 300 mg at bedtime 2/2 encephalopathy (home is 300 mg BID)    Left 1st proximal phalanx fracture, mildly displaced, initial encounter  Ground-level fall out of bed X-ray left foot with fracture base left great toe proximal flailing status mildly displaced. -- Postoperative shoe, weightbearing as tolerates -- PT/OT eval   Recent open fracture R ankle s/p ORIF on 11/14  Open right trimalleolar ankle fracture dislocation including syndesmosis s/p I&D and ORIF of fibula, medial malleolus, posterior malleolus and syndesmosis on 11/14. -- NWB x 8 weeks post op per Ortho; plan for splint to be converted to short leg cast at 2 wk post op.  -- PT/OT eval pending; patient and family request change in SNF placement; TOC consulted  -- Continue Vit D3 5000 IU daily  --Outpatient follow-up with orthopedics   Hypokalemia, asymptomatic, repleting  Repleted potassium -- BMP daily with magnesium   Resolving AKI stage II on CKD stage III:  Baseline Cr 1.8 - 2.  Last admission peaked at 4.7, thought to be related to hypotension, NSAID, lasix, losartan I.e. prerenal. Back to 2.1 at time of admission.  -- continue to hold losartan -- repeat BMP in the am  Anemia, secondary to acute blood loss, improving:  Baseline Hb 13-14, nadir of 8.6 last admission. Back to 10 this admission.   HTN Hx orthostatic hypotension:  -- Continue home Carvedilol. -- Losartan currently held from past admit -- continue to hold home  furosemide for now (on 80mg  BID)  Hyperlipidemia:  -- Continue rosuvastatin 20mg  PO daily    Hypothyroid:  Recent TSH: 3.387 -- Continue home levothyroxine 88 mcg p.o. daily  OSA  -- Continue nocturnal CPAP  Paroxysmal atrial fibrillation:  -- Continue amiodarone, Eliquis  CAD/PAD/lower extremity venous stasis ulcers:  Hx CEA, cardiac and peripheral artery stents. -- Recently taken off Plavix and started on Eliquis.   -- Continue statin --  Follows with outpatient wound care.  Chronic diastolic CHF:  TTE 02/2022: LVEF 50-55%.  -- Diuretics currently held with hypokalemia -- strict I's and O's  Anxiety and depression: Continue home Abilify and Cymbalta  Ambulatory dysfunction/frequent falls/?  Orthostatic hypotension:  -- Check orthostatics supine to seated only given WB status. Falls also in part related to neuropathy.   -- PT/OT eval pending  Morbid obesity, class III Body mass index is 42.17 kg/m.  Patient on [facility] outpatient and efforts to lose weight.  Complicates all facets of care.    DVT prophylaxis:  apixaban (ELIQUIS) tablet 5 mg    Code Status: Full Code Family Communication: Updated patient's significant other present at bedside this morning  Disposition Plan:  Level of care: Med-Surg Status is: Observation The patient remains OBS appropriate and will d/c before 2 midnights.    Consultants:  None  Procedures:  None  Antimicrobials:  None   Subjective: Patient seen examined bedside, resting calmly.  Lying in bed.  Significant other present at bedside.  Mental status appears to be back at his typical baseline.  Concerned about returning to previous SNF given challenges faced at that facility.  Continues on D10 drip; blood sugars are slowly improving.  Continues to be concerned about his mobility status, discussed with patient that needs to remain nonweightbearing to his right lower extremity given recent surgery.  No other specific questions,  concerns or complaints at this time.  Denies headache, no dizziness, no chest pain, no palpitations, no shortness of breath, no abdominal pain, no fever/chills/night sweats, no nausea cefonicid diarrhea, no focal weakness, no fatigue, no paresthesias.  No acute events overnight per nursing.  Objective: Vitals:   01/09/23 0439 01/09/23 0642 01/09/23 0701 01/09/23 0828  BP:  127/85 (!) 159/72 (!) 159/72  Pulse: 60 64 88 88  Resp: 18 19 18    Temp:  98.4 F (36.9 C) 98.2 F (36.8 C)   TempSrc:  Oral Oral   SpO2: 100% 100% 92%   Weight:      Height:        Intake/Output Summary (Last 24 hours) at 01/09/2023 1113 Last data filed at 01/09/2023 6578 Gross per 24 hour  Intake 573.54 ml  Output --  Net 573.54 ml   Filed Weights   01/09/23 0024  Weight: (!) 145 kg    Examination:  Physical Exam: GEN: NAD, alert and oriented x 3, morbidly obese, chronically ill in appearance HEENT: NCAT, PERRL, EOMI, sclera clear, MMM PULM: CTAB w/o wheezes/crackles, normal respiratory effort, on room air CV: RRR w/o M/G/R GI:  abd soft, NTND, NABS, no R/G/M MSK: no peripheral edema, muscle strength globally intact 5/5 bilateral upper/lower extremities NEURO: CN II-XII intact, no focal deficits, sensation to light touch intact PSYCH: normal mood/affect Integumentary: Right forearm abrasion noted with 1 cm hemorrhagic bulla in the webspace between first and second digit right hand, right lower extremity with dressing/splint in place, left foot with ecchymosis greatest at the great toe, otherwise no other concerning rashes/lesions/wounds noted on exposed skin surfaces.      Data Reviewed: I have personally reviewed following labs and imaging studies  CBC: Recent Labs  Lab 01/04/23 0528 01/05/23 0609 01/06/23 0817 01/07/23 0737 01/08/23 2356 01/09/23 0004  WBC 13.7* 8.6 6.0 8.0 9.4  --   NEUTROABS  --   --   --   --  7.0  --   HGB 9.2* 8.6* 8.8* 9.5* 10.3* 10.2*  HCT 26.6* 25.0* 26.0* 27.6*  30.3* 30.0*  MCV 89.6 91.2 92.2 91.1 91.8  --   PLT 187 175 159 194 220  --    Basic Metabolic Panel: Recent Labs  Lab 01/03/23 1432 01/04/23 0528 01/05/23 0609 01/06/23 0817 01/07/23 0737 01/08/23 2356 01/09/23 0004 01/09/23 0622  NA  --  138 138 139 140 139 140 138  K  --  3.2* 3.3* 4.2 3.6 3.0* 2.9* 3.8  CL  --  101 106 107 104 103 100 101  CO2  --  26 24 27 28 26   --  29  GLUCOSE  --  138* 85 205* 115* 662* 690* 120*  BUN  --  62* 57* 48* 41* 32* 31* 31*  CREATININE  --  4.49* 3.46* 2.96* 2.39* 2.18* 2.10* 2.18*  CALCIUM  --  8.4* 8.3* 8.5* 8.6* 8.6*  --  8.6*  MG 2.1  --   --   --   --   --   --  2.2  PHOS  --  4.6 4.1 2.7  --   --   --  3.4   GFR: Estimated Creatinine Clearance: 47.9 mL/min (A) (by C-G formula based on SCr of 2.18 mg/dL (H)). Liver Function Tests: Recent Labs  Lab 01/04/23 0528 01/05/23 0609 01/06/23 0817 01/08/23 2356  AST  --   --   --  29  ALT  --   --   --  16  ALKPHOS  --   --   --  68  BILITOT  --   --   --  0.7  PROT  --   --   --  5.7*  ALBUMIN 2.8* 2.6* 2.5* 3.0*   No results for input(s): "LIPASE", "AMYLASE" in the last 168 hours. No results for input(s): "AMMONIA" in the last 168 hours. Coagulation Profile: No results for input(s): "INR", "PROTIME" in the last 168 hours. Cardiac Enzymes: Recent Labs  Lab 01/02/23 1644 01/04/23 0528 01/08/23 2356  CKTOTAL 251 319 207   BNP (last 3 results) No results for input(s): "PROBNP" in the last 8760 hours. HbA1C: No results for input(s): "HGBA1C" in the last 72 hours. CBG: Recent Labs  Lab 01/09/23 0152 01/09/23 0429 01/09/23 0625 01/09/23 0628 01/09/23 0817  GLUCAP 95 81 74 51* 78   Lipid Profile: No results for input(s): "CHOL", "HDL", "LDLCALC", "TRIG", "CHOLHDL", "LDLDIRECT" in the last 72 hours. Thyroid Function Tests: No results for input(s): "TSH", "T4TOTAL", "FREET4", "T3FREE", "THYROIDAB" in the last 72 hours. Anemia Panel: No results for input(s): "VITAMINB12",  "FOLATE", "FERRITIN", "TIBC", "IRON", "RETICCTPCT" in the last 72 hours. Sepsis  Labs: No results for input(s): "PROCALCITON", "LATICACIDVEN" in the last 168 hours.  Recent Results (from the past 240 hour(s))  Surgical pcr screen     Status: Abnormal   Collection Time: 01/02/23  9:31 AM   Specimen: Nasal Mucosa; Nasal Swab  Result Value Ref Range Status   MRSA, PCR POSITIVE (A) NEGATIVE Final    Comment: RESULT CALLED TO, READ BACK BY AND VERIFIED WITH: [place] ON 11424 @1210H  BY SM    Staphylococcus aureus POSITIVE (A) NEGATIVE Final    Comment: (NOTE) The Xpert SA Assay (FDA approved for NASAL specimens in patients 58 years of age and older), is one component of a comprehensive surveillance program. It is not intended to diagnose infection nor to guide or monitor treatment. Performed at [facility], [address], [place], [place] [address]          Radiology Studies: DG Foot Complete Left  Result Date: 01/09/2023 CLINICAL DATA:  Fall, left foot pain EXAM: LEFT FOOT - COMPLETE 3+ VIEW COMPARISON:  09/09/2019 FINDINGS: Fracture noted at the base of the left 1st proximal phalanx. Fracture fragment mildly displaced. Degenerative changes at the 1st MTP joint and 1st IP joint. No subluxation or dislocation. Plantar calcaneal spur. Soft tissues are intact. IMPRESSION: Fracture at the base of the left great toe proximal phalanx. Electronically Signed   By: Charlett Nose M.D.   On: 01/09/2023 02:02   CT Cervical Spine Wo Contrast  Result Date: 01/09/2023 CLINICAL DATA:  Fall, on blood thinners EXAM: CT CERVICAL SPINE WITHOUT CONTRAST TECHNIQUE: Multidetector CT imaging of the cervical spine was performed without intravenous contrast. Multiplanar CT image reconstructions were also generated. RADIATION DOSE REDUCTION: This exam was performed according to the departmental dose-optimization program which includes automated exposure control, adjustment of the mA and/or kV according  to patient size and/or use of iterative reconstruction technique. COMPARISON:  None available FINDINGS: Alignment: Normal. Skull base and vertebrae: No acute fracture. No primary bone lesion or focal pathologic process. Soft tissues and spinal canal: No prevertebral fluid or swelling. No visible canal hematoma. Disc levels: Early anterior spurring in the mid and lower cervical spine. Mild degenerative facet disease bilaterally. Upper chest: No acute findings Other: None IMPRESSION: No acute bony abnormality. Electronically Signed   By: Charlett Nose M.D.   On: 01/09/2023 01:08   CT Head Wo Contrast  Result Date: 01/09/2023 CLINICAL DATA:  Fall, on blood thinners. EXAM: CT HEAD WITHOUT CONTRAST TECHNIQUE: Contiguous axial images were obtained from the base of the skull through the vertex without intravenous contrast. RADIATION DOSE REDUCTION: This exam was performed according to the departmental dose-optimization program which includes automated exposure control, adjustment of the mA and/or kV according to patient size and/or use of iterative reconstruction technique. COMPARISON:  None Available. FINDINGS: Brain: Diffuse cerebral atrophy. No acute intracranial abnormality. Specifically, no hemorrhage, hydrocephalus, mass lesion, acute infarction, or significant intracranial injury. Vascular: No hyperdense vessel or unexpected calcification. Skull: No acute calvarial abnormality. Sinuses/Orbits: No acute findings Other: None IMPRESSION: Atrophy.  No acute intracranial abnormality. Electronically Signed   By: Charlett Nose M.D.   On: 01/09/2023 01:07        Scheduled Meds:  allopurinol  100 mg Oral Daily   amiodarone  200 mg Oral Daily   apixaban  5 mg Oral BID   ARIPiprazole  5 mg Oral Daily   carvedilol  3.125 mg Oral BID WC   cholecalciferol  125 mcg Oral Daily   DULoxetine  60 mg Oral Daily   ezetimibe  10 mg Oral Daily   gabapentin  300 mg Oral QHS   insulin aspart  0-15 Units Subcutaneous TID  WC   levothyroxine  88 mcg Oral QAC breakfast   pantoprazole  40 mg Oral Daily   rosuvastatin  20 mg Oral QPM   sodium chloride flush  3 mL Intravenous Q12H   Continuous Infusions:  dextrose 125 mL/hr at 01/09/23 0558     LOS: 0 days    Time spent: 56 minutes spent on chart review, discussion with nursing staff, consultants, updating family and interview/physical exam; more than 50% of that time was spent in counseling and/or coordination of care.    Alvira Philips [place], DO [facility] Available via Epic secure chat 7am-7pm After these hours, please refer to coverage provider listed on [email or web address] 01/09/2023, 11:13 AM

## 2023-01-09 NOTE — TOC Initial Note (Signed)
Transition of Care [facility]) - Initial/Assessment Note    Patient Details  Name: Harold [name] MRN: [id number] Date of Birth: [birth year]  Transition of Care [facility]) CM/SW Contact:    Lorri Frederick, LCSW Phone Number: 01/09/2023, 1:59 PM  Clinical Narrative:    CSW met with pt regarding DC plan.   Pt recent DC 11/19 to [facility], reports had very poor experience in the first day there and prefers to look for other options.  Pt from home with husband [name], no current services, permission given to speak with [name].  Permission to send referral out for SNF.             Expected Discharge Plan: Skilled Nursing Facility Barriers to Discharge: Continued Medical Work up, SNF Pending bed offer   Patient Goals and CMS Choice Patient states their goals for this hospitalization and ongoing recovery are:: walk, be independent CMS Medicare.gov Compare Post Acute Care list provided to:: Patient Choice offered to / list presented to : Patient      Expected Discharge Plan and Services In-house Referral: Clinical Social Work   Post Acute Care Choice: Skilled Nursing Facility Living arrangements for the past 2 months: Single Family Home                                      Prior Living Arrangements/Services Living arrangements for the past 2 months: Single Family Home Lives with:: Spouse Patient language and need for interpreter reviewed:: Yes Do you feel safe going back to the place where you live?: Yes      Need for Family Participation in Patient Care: No (Comment) Care giver support system in place?: Yes (comment) Current home services: Other (comment) (none) Criminal Activity/Legal Involvement Pertinent to Current Situation/Hospitalization: No - Comment as needed  Activities of Daily Living   ADL Screening (condition at time of admission) Independently performs ADLs?: Yes (appropriate for developmental age) Is the patient deaf or have difficulty hearing?: No Does  the patient have difficulty seeing, even when wearing glasses/contacts?: No Does the patient have difficulty concentrating, remembering, or making decisions?: Yes  Permission Sought/Granted Permission sought to share information with : Family Supports Permission granted to share information with : Yes, Verbal Permission Granted  Share Information with NAME: husband [name]  Permission granted to share info w AGENCY: SNF        Emotional Assessment Appearance:: Appears stated age Attitude/Demeanor/Rapport: Engaged Affect (typically observed): Appropriate, Pleasant Orientation: : Oriented to Self, Oriented to Place, Oriented to  Time, Oriented to Situation      Admission diagnosis:  Hypoglycemia [E16.2] Hypoglycemia due to insulin [E16.0, T38.3X5A] Fall at nursing home, initial encounter [W19.Lorne Skeens, Y92.129] Patient Active Problem List   Diagnosis Date Noted   Hypoglycemia due to insulin 01/09/2023   Toe fracture, left 01/09/2023   Ground-level fall 01/09/2023   Acute metabolic encephalopathy 01/09/2023   Hypokalemia 01/09/2023   Vitamin D deficiency 01/07/2023   Open ankle fracture 01/02/2023   Prolonged QT interval 01/02/2023   Fall at home, initial encounter 01/02/2023   Leukocytosis 01/02/2023   Acute kidney injury superimposed on chronic kidney disease (HCC) 01/02/2023   Normocytic anemia 01/02/2023   Paroxysmal atrial fibrillation (HCC) 01/02/2023   Uncontrolled type 2 diabetes mellitus with hyperglycemia, with long-term current use of insulin (HCC) 01/02/2023   Hypothyroidism 01/02/2023   CKD stage 3b, GFR 30-44 ml/min (HCC) 11/07/2022   CAD in  native artery 11/07/2022   PVD (peripheral vascular disease) (HCC) 11/06/2022   Hypercoagulable state due to persistent atrial fibrillation (HCC) 02/19/2022   Persistent atrial fibrillation (HCC) 01/28/2022   Lumbar stenosis with neurogenic claudication 07/26/2020   Diabetic neuropathy (HCC) 08/24/2019   Contusion of right  elbow 09/02/2018   Trigger thumb of left hand 09/02/2018   Atherosclerosis of native artery of both lower extremities with intermittent claudication (HCC) 05/12/2018   Pain of left hand 03/26/2018   Trigger finger 03/03/2017   Pain in finger of left hand 03/03/2017   Snoring 08/20/2016   Morbid (severe) obesity due to excess calories (HCC) 04/18/2016   Venous stasis of both lower extremities - with edema 02/25/2015   Right-sided chest wall pain 05/08/2014   Gastroesophageal reflux disease without esophagitis 10/10/2013   Chronic heart failure with preserved ejection fraction (HFpEF) (HCC) 06/27/2013   COPD (chronic obstructive pulmonary disease) (HCC) 06/17/2013   Restrictive lung disease 06/17/2013   Myalgia and myositis 04/20/2013   Aftercare following surgery of the circulatory system, NEC 02/22/2013   Obesity, Class III, BMI 40-49.9 (morbid obesity) (HCC) 09/01/2012   Fatigue 09/01/2012   Preoperative cardiovascular examination 09/01/2012   Chronic renal insufficiency, stage II (mild) 04/30/2012   OSA on CPAP 04/29/2012   Pulmonary hypertension, 04/29/2012   Dyspnea on exertion   04/26/2012   CAD, LAD/CFX DES 04/28/12- staged RCA DES 04/29/12 after pt declined CABG, cath 05/14/13 stable CAD medical therapy    Carotid artery stenosis without cerebral infarction, bilateral 11/07/2011   Diabetes mellitus type 2 with neurological manifestations (HCC) 04/29/2011   Essential hypertension 04/29/2011   Neuropathy 04/29/2011   Hyperlipidemia associated with type 2 diabetes mellitus (HCC) 04/29/2011   Anxiety and depression 04/29/2011   PCP:  [name], MD Pharmacy:   [facility] [phone] - [address], [place] - [address] [place] [address] Phone: [phone] Fax: [phone]     Social Determinants of Health (SDOH) Social History: SDOH Screenings   Food Insecurity: No Food Insecurity (01/09/2023)  Housing: Low Risk  (01/09/2023)   Transportation Needs: No Transportation Needs (01/09/2023)  Utilities: Not At Risk (01/09/2023)  Tobacco Use: Medium Risk (01/02/2023)   SDOH Interventions:     Readmission Risk Interventions     No data to display

## 2023-01-09 NOTE — Plan of Care (Signed)
Problem: Education: Goal: Ability to describe self-care measures that may prevent or decrease complications (Diabetes Survival Skills Education) will improve 01/09/2023 2013 by Charma Igo, LPN Outcome: Progressing 01/09/2023 0735 by Charma Igo, LPN Outcome: Progressing Goal: Individualized Educational Video(s) 01/09/2023 2013 by Charma Igo, LPN Outcome: Progressing 01/09/2023 0735 by Charma Igo, LPN Outcome: Progressing   Problem: Coping: Goal: Ability to adjust to condition or change in health will improve 01/09/2023 2013 by Charma Igo, LPN Outcome: Progressing 01/09/2023 0735 by Charma Igo, LPN Outcome: Progressing   Problem: Fluid Volume: Goal: Ability to maintain a balanced intake and output will improve 01/09/2023 2013 by Charma Igo, LPN Outcome: Progressing 01/09/2023 0735 by Charma Igo, LPN Outcome: Progressing   Problem: Health Behavior/Discharge Planning: Goal: Ability to identify and utilize available resources and services will improve 01/09/2023 2013 by Charma Igo, LPN Outcome: Progressing 01/09/2023 0735 by Charma Igo, LPN Outcome: Progressing Goal: Ability to manage health-related needs will improve 01/09/2023 2013 by Charma Igo, LPN Outcome: Progressing 01/09/2023 0735 by Charma Igo, LPN Outcome: Progressing   Problem: Metabolic: Goal: Ability to maintain appropriate glucose levels will improve 01/09/2023 2013 by Charma Igo, LPN Outcome: Progressing 01/09/2023 0735 by Charma Igo, LPN Outcome: Progressing   Problem: Nutritional: Goal: Maintenance of adequate nutrition will improve 01/09/2023 2013 by Charma Igo, LPN Outcome: Progressing 01/09/2023 0735 by Charma Igo, LPN Outcome: Progressing Goal: Progress toward achieving an optimal weight will improve 01/09/2023 2013 by Charma Igo, LPN Outcome: Progressing 01/09/2023 0735 by Charma Igo,  LPN Outcome: Progressing   Problem: Skin Integrity: Goal: Risk for impaired skin integrity will decrease 01/09/2023 2013 by Charma Igo, LPN Outcome: Progressing 01/09/2023 0735 by Charma Igo, LPN Outcome: Progressing   Problem: Tissue Perfusion: Goal: Adequacy of tissue perfusion will improve 01/09/2023 2013 by Charma Igo, LPN Outcome: Progressing 01/09/2023 0735 by Charma Igo, LPN Outcome: Progressing   Problem: Education: Goal: Knowledge of General Education information will improve Description: Including pain rating scale, medication(s)/side effects and non-pharmacologic comfort measures 01/09/2023 2013 by Charma Igo, LPN Outcome: Progressing 01/09/2023 0735 by Charma Igo, LPN Outcome: Progressing   Problem: Health Behavior/Discharge Planning: Goal: Ability to manage health-related needs will improve 01/09/2023 2013 by Charma Igo, LPN Outcome: Progressing 01/09/2023 0735 by Charma Igo, LPN Outcome: Progressing   Problem: Clinical Measurements: Goal: Ability to maintain clinical measurements within normal limits will improve 01/09/2023 2013 by Charma Igo, LPN Outcome: Progressing 01/09/2023 0735 by Charma Igo, LPN Outcome: Progressing Goal: Will remain free from infection 01/09/2023 2013 by Charma Igo, LPN Outcome: Progressing 01/09/2023 0735 by Charma Igo, LPN Outcome: Progressing Goal: Diagnostic test results will improve 01/09/2023 2013 by Charma Igo, LPN Outcome: Progressing 01/09/2023 0735 by Charma Igo, LPN Outcome: Progressing Goal: Respiratory complications will improve 01/09/2023 2013 by Charma Igo, LPN Outcome: Progressing 01/09/2023 0735 by Charma Igo, LPN Outcome: Progressing Goal: Cardiovascular complication will be avoided 01/09/2023 2013 by Charma Igo, LPN Outcome: Progressing 01/09/2023 0735 by Charma Igo, LPN Outcome: Progressing    Problem: Activity: Goal: Risk for activity intolerance will decrease 01/09/2023 2013 by Charma Igo, LPN Outcome: Progressing 01/09/2023 0735 by Charma Igo, LPN Outcome: Progressing   Problem: Nutrition: Goal: Adequate nutrition will be maintained 01/09/2023 2013 by Charma Igo, LPN Outcome: Progressing 01/09/2023 0735 by Charma Igo, LPN Outcome: Progressing  Problem: Coping: Goal: Level of anxiety will decrease 01/09/2023 2013 by Charma Igo, LPN Outcome: Progressing 01/09/2023 0735 by Charma Igo, LPN Outcome: Progressing   Problem: Elimination: Goal: Will not experience complications related to bowel motility 01/09/2023 2013 by Charma Igo, LPN Outcome: Progressing 01/09/2023 0735 by Charma Igo, LPN Outcome: Progressing Goal: Will not experience complications related to urinary retention 01/09/2023 2013 by Charma Igo, LPN Outcome: Progressing 01/09/2023 0735 by Charma Igo, LPN Outcome: Progressing   Problem: Pain Management: Goal: General experience of comfort will improve 01/09/2023 2013 by Charma Igo, LPN Outcome: Progressing 01/09/2023 0735 by Charma Igo, LPN Outcome: Progressing   Problem: Safety: Goal: Ability to remain free from injury will improve 01/09/2023 2013 by Charma Igo, LPN Outcome: Progressing 01/09/2023 0735 by Charma Igo, LPN Outcome: Progressing   Problem: Skin Integrity: Goal: Risk for impaired skin integrity will decrease 01/09/2023 2013 by Charma Igo, LPN Outcome: Progressing 01/09/2023 0735 by Charma Igo, LPN Outcome: Progressing

## 2023-01-09 NOTE — ED Notes (Signed)
Trauma Response Nurse Documentation   HUEL CUTRER is a 67 y.o. male arriving to [facility] ED via [facility]  On Eliquis (apixaban) daily. Trauma was activated as a Level 2 by Eston Esters based on the following trauma criteria Elderly patients > 65 with head trauma on anti-coagulation (excluding ASA).  Patient cleared for CT by Dr. Eudelia Bunch. Pt transported to CT with trauma response nurse present to monitor. RN remained with the patient throughout their absence from the department for clinical observation.   GCS 15.  History   Past Medical History:  Diagnosis Date   Anginal pain (HCC) 04/2013   Cardiac cath showed patent stents with distal LAD, circumflex-OM and RCA disease in small vessels.   Anxiety    Atrial fibrillation (HCC) 12/2021   CAD S/P percutaneous coronary angioplasty 2003, 04/2012   status post PCI to LAD, circumflex-OM 2, RCA   Carotid artery occlusion    Chronic renal insufficiency, stage II (mild)    Chronic venous insufficiency    varicosities, no reflux; dopplers 04/14/12- valvular insufficiency in the R and L GSV   Complication of anesthesia    COPD, mild (HCC)    Depression    situaltional    Diabetes mellitus    Diabetic neuropathy (HCC) 08/24/2019   Dyslipidemia associated with type 2 diabetes mellitus (HCC)    GERD (gastroesophageal reflux disease)    History of cardioversion 12/2021   at [facility]   HTN (hypertension)    Hypothyroidism    Neuromuscular disorder (HCC)    neuropathy in feet   Neuropathy    notably improved following PCI with improved cardiac function   Obesity    Obesity, Class II, BMI 35.0-39.9, with comorbidity (see actual BMI)    BMI 39; wgt loss efforts in place; seeing Dietician   PONV (postoperative nausea and vomiting)    "[employer]"   Pulmonary hypertension (HCC) 04/2012   PA pressure   Sleep apnea    uses nightly     Past Surgical History:  Procedure Laterality Date   ABDOMINAL  AORTAGRAM N/A 11/15/2011   Procedure: ABDOMINAL Ronny Flurry;  Surgeon: Sherren Kerns, MD;  Location: [facility] CATH LAB;  Service: Cardiovascular;  [name];   ABDOMINAL AORTOGRAM W/LOWER EXTREMITY N/A 10/13/2019   Procedure: ABDOMINAL AORTOGRAM W/LOWER EXTREMITY;  Surgeon: Iran Ouch, MD;  Location: [facility] INVASIVE CV LAB;  Service: CV: Non-obst Ao-Iliac. R SFA-PopA patent with significant calcific stenosis of TP trunk-prox PTA (dominant) & CTO ATA  -> R PTA-TP PTCA   ABDOMINAL AORTOGRAM W/LOWER EXTREMITY N/A 11/06/2022   Procedure: ABDOMINAL AORTOGRAM W/LOWER EXTREMITY;  Surgeon: Iran Ouch, MD;  Location: [facility] INVASIVE CV LAB;  Service: Cardiovascular;  [name];   ABIs  04/27/2012   mild bilateral arterial insufficiency   BACK SURGERY  2005 x1   X2-2010   BUNIONECTOMY Right 11/11/2013   Procedure: RIGHT FOOT SILVER BUNIONECTOMY;  Surgeon: Toni Arthurs, MD;  Location: [facility];  Service: Orthopedics;  Laterality: Right;   CARDIAC CATHETERIZATION  12/11/2001   significant 3V CAD, normal LV function   CARDIOVERSION N/A 03/13/2022   Procedure: CARDIOVERSION;  Surgeon: Little Ishikawa, MD;  Location: [facility] ENDOSCOPY;  Service: Cardiovascular;  [name];   CARDIOVERSION N/A 07/25/2022   Procedure: CARDIOVERSION;  Surgeon: Jake Bathe, MD;  Location: [facility] INVASIVE CV LAB;  Service: Cardiovascular;  [name];   Carotid Doppler  04/27/2012   right internal carotid: Elevated velocities but  no evidence of plaque. Left internal carotid 40-59%   CAROTID ENDARTERECTOMY  2005   Right; recent carotid Dopplers notes elevated velocities.    colonscopy     CORONARY ANGIOPLASTY  12/21/2001   PTCA of the distal and mid AV groove circ, unsuccessful PTCA of second OM total occlusion, unsuccessful PTCA of the apical LAD total occlusion   CORONARY ANGIOPLASTY WITH STENT PLACEMENT  04/29/2012   PCI to 3 RCA lesions, Promus Premiere 2.6mmx8mm distally, mid was  2.44mx28mm and proximally 2.75x61mm, EF 55-60%   CORONARY STENT PLACEMENT  04/28/2012   PCI to LAD (3x66mm Xience DES postdilated to 3.25) and circ prox and mid (2 overlappinmg 2.49mmx12mmXience DES postdilated to 2.23mm)   DOPPLER ECHOCARDIOGRAPHY  04/28/2012   poor quality study: EF estimated 60-65%; unable to assess diastolic function (previously noted to have diastolic dysfunction); severely dilated left atrium and mild right atrium; dilated IVC consistent with increased central venous pressure.Marland Kitchen   HERNIA REPAIR     I & D EXTREMITY Right 01/02/2023   Procedure: IRRIGATION AND DEBRIDEMENT RIGHT ANKLE;  Surgeon: [name], MD;  [name];  Service: Orthopedics;  Laterality: Right;   LEFT AND RIGHT HEART CATHETERIZATION WITH CORONARY ANGIOGRAM N/A 04/27/2012   Procedure: LEFT AND RIGHT HEART CATHETERIZATION WITH CORONARY ANGIOGRAM;  Surgeon: Marykay Lex, MD;  Location[facility] C[facility] LAB;  Service: Cardiovascular;  [name];   LEFT AND RIGHT HEART CATHETERIZATION WITH CORONARY ANGIOGRAM N/A 05/14/2013   Procedure: LEFT AND RIGHT HEART CATHETERIZATION WITH CORONARY ANGIOGRAM;  Surgeon: Lennette Bihari, MD;  [name]; PCWP ;; [name], [name], Patent RCA ostial mid and distal stents - 70% distal runoff Dz   LUMBAR LAMINECTOMY/DECOMPRESSION MICRODISCECTOMY  01/07/2012   Procedure: LUMBAR LAMINECTOMY/DECOMPRESSION MICRODISCECTOMY 1 LEVEL;  Surgeon: Carmela Hurt, MD;  [name];  Service: Neurosurgery;  Laterality: Bilateral;   Lumbar Three-Four Decompression   LUMBAR LAMINECTOMY/DECOMPRESSION MICRODISCECTOMY N/A 07/26/2020   Procedure: Lumbar two-three Laminectomy/Foraminotomy;  Surgeon: Coletta Memos, MD;  [name];  Service: Neurosurgery;  [name];   ORIF ANKLE FRACTURE Right 01/02/2023   Procedure: OPEN  REDUCTION INTERNAL FIXATION (ORIF)RIGHT ANKLE FRACTURE;  Surgeon: [name], MD;  [name];  Service: Orthopedics;  Laterality: Right;   PERCUTANEOUS CORONARY STENT INTERVENTION (PCI-S) N/A 04/28/2012   Procedure: PERCUTANEOUS CORONARY STENT INTERVENTION (PCI-S);  Surgeon: Lennette Bihari, MD;  Location: [facility] CATH LAB;  Service: Cardiovascular;  [name];   PERCUTANEOUS CORONARY STENT INTERVENTION (PCI-S) N/A 04/29/2012   Procedure: PERCUTANEOUS CORONARY STENT INTERVENTION (PCI-S);  Surgeon: Marykay Lex, MD;  Location: [facility] CATH LAB;  Service: Cardiovascular;  [name];   PERIPHERAL VASCULAR ATHERECTOMY  11/06/2022   Procedure: PERIPHERAL VASCULAR ATHERECTOMY;  Surgeon: Iran Ouch, MD;  [facility] CV LAB;  Service: Cardiovascular;;   PERIPHERAL VASCULAR BALLOON ANGIOPLASTY Right 10/13/2019   Procedure: PERIPHERAL VASCULAR BALLOON ANGIOPLASTY;  Surgeon: Iran Ouch, MD;  [facility]VAS[facility] CV LAB;  Service: Cardiovascular;  Laterality: Right;  PTCA of R TP Trunk into PTA (Drug-coated) => Follow-up ABIs 0.9 on the right and 0.99 on the left.   SPINE SURGERY     UMBILICAL HERNIA REPAIR  2009   steel mesh insert       Initial Focused Assessment (If applicable, or please see trauma documentation): Airway-- intact, no visible  obstruction Breathing-- spontaneous, unlabored Circulation-- no obvious bleeding noted on exam  CT's Completed:   CT Head and CT C-Spine   Interventions:   Plan for disposition:  Admission to floor   Consults completed:  none at 0200.  Event Summary: Patient brought in by [facility] from rehab facility. Patient with an unwitnessed fall this evening. Unknown if patient struck his head. With EMS patient cbg 45, EMS administered D10, cbg up to 156. On arrival patient, patient minimally responsive, cbg recheck was 35. Amp of D5 administered. Patient cbg up to 140s. Lab work obtained. D10 gtt initiated. Patient to  CT with TRN and primary RN. CT head an dc-spine completed. Patient back to exam room at this time.   MTP Summary (If applicable):  N/A  Bedside handoff with ED RN Aggie Cosier.    Leota Sauers  Trauma Response RN  Please call TRN at [phone] for further assistance.

## 2023-01-09 NOTE — Evaluation (Signed)
Occupational Therapy Evaluation Patient Details Name: Harold [name] MRN: [id number] DOB: May 07, 1955 Today's Date: 01/09/2023   History of Present Illness Pt is a 67 y/o male who recently had an admission on 11/14 after a fall and had a R ankle ORIF with NWB. Pt now was admitted on 01/08/23 due to another fall at [facility]. He was found to have a L 1st proximal phalanx fx. Pt Significant PMH: HTN, HLD, CAD s/p PCI, COPD, DM2, CKD stage IIIb, PVD, gout, OSA.   Clinical Impression   Pt at this time presented with family and reported on their treatment at the SNF they were at was very poor and do not want to return. Harold [name] was able to complete bed mobility with supervision with [facility] elevated and B bed rails. Attempted to complete an anterior/posterior transfer with max x2 assist but was unable to complete at this time. Pt is able to complete UE ADLS post set up at EOB but LB ADLS with max to total assist with lateral leans while sitting at EOB. Patient will benefit from continued inpatient follow up therapy, <3 hours/day.         If plan is discharge home, recommend the following: Two people to help with walking and/or transfers;Two people to help with bathing/dressing/bathroom;Assistance with cooking/housework;Assist for transportation;Help with stairs or ramp for entrance    Functional Status Assessment  Patient has had a recent decline in their functional status and demonstrates the ability to make significant improvements in function in a reasonable and predictable amount of time.  Equipment Recommendations  Other (comment) (If pt would to go home they will need hospital bed and WC)    Recommendations for Other Services       Precautions / Restrictions Precautions Precautions: Fall Precaution Comments: multiple falls at SNF reported Required Braces or Orthoses: Splint/Cast Splint/Cast: hard, on RLE Restrictions Weight Bearing Restrictions: Yes RLE Weight Bearing: Non  weight bearing Other Position/Activity Restrictions: partial weight bearing on the LLE with post op shoe, ortho was consulted      Mobility Bed Mobility Overal bed mobility: Needs Assistance Bed Mobility: Supine to Sit     Supine to sit: Supervision, HOB elevated Sit to supine: Supervision, HOB elevated   General bed mobility comments: no physical assist required    Transfers                   General transfer comment: attempted to complete A-P transfer but unable to complete with maxx 2 assist      Balance Overall balance assessment: Needs assistance Sitting-balance support: Feet supported Sitting balance-Leahy Scale: Good Sitting balance - Comments: sitting EOB     Standing balance-Leahy Scale: Zero                             ADL either performed or assessed with clinical judgement   ADL Overall ADL's : Needs assistance/impaired Eating/Feeding: Independent;Sitting   Grooming: Set up;Sitting   Upper Body Bathing: Set up;Sitting   Lower Body Bathing: Maximal assistance;Sitting/lateral leans   Upper Body Dressing : Set up;Sitting   Lower Body Dressing: Maximal assistance;Sitting/lateral leans                 General ADL Comments: attempted to complete A-P transfer but was unable to complete     Vision         Perception         Praxis  Pertinent Vitals/Pain Pain Assessment Pain Assessment: 0-10 Pain Score: 6  Pain Location: R ankle Pain Descriptors / Indicators: Discomfort, Grimacing, Guarding Pain Intervention(s): Limited activity within patient's tolerance, Monitored during session, Repositioned     Extremity/Trunk Assessment Upper Extremity Assessment Upper Extremity Assessment: Generalized weakness;Left hand dominant   Lower Extremity Assessment Lower Extremity Assessment: Defer to PT evaluation       Communication Communication Communication: No apparent difficulties   Cognition Arousal:  Alert Behavior During Therapy: WFL for tasks assessed/performed Overall Cognitive Status: Impaired/Different from baseline Area of Impairment: Safety/judgement                         Safety/Judgement: Decreased awareness of safety           General Comments       Exercises     Shoulder Instructions      Home Living Family/patient expects to be discharged to:: Private residence Living Arrangements: Spouse/significant other Available Help at Discharge: Family Type of Home: House Home Access: Stairs to enter Secretary/administrator of Steps: 3 Entrance Stairs-Rails: None Home Layout: One level     Bathroom Shower/Tub: Chief Strategy Officer: Handicapped height     Home Equipment: Shower seat          Prior Functioning/Environment Prior Level of Function : History of Falls (last six months);Needs assist             Mobility Comments: no AD, hx 20-30 falls ADLs Comments: reports needing assist for LB ADLs from significant other, supervision for bathing and assist for tub transfers        OT Problem List: Decreased strength;Decreased activity tolerance;Impaired balance (sitting and/or standing);Decreased knowledge of use of DME or AE;Decreased knowledge of precautions;Pain;Obesity      OT Treatment/Interventions: Self-care/ADL training;Therapeutic exercise;DME and/or AE instruction;Therapeutic activities;Balance training;Patient/family education    OT Goals(Current goals can be found in the care plan section) Acute Rehab OT Goals Patient Stated Goal: to be able to get home OT Goal Formulation: With patient Time For Goal Achievement: 01/17/23 Potential to Achieve Goals: Fair  OT Frequency: Min 1X/week    Co-evaluation PT/OT/SLP Co-Evaluation/Treatment: Yes Reason for Co-Treatment: For patient/therapist safety;To address functional/ADL transfers   OT goals addressed during session: ADL's and self-care      AM-PAC OT "6 Clicks"  Daily Activity     Outcome Measure Help from another person eating meals?: None Help from another person taking care of personal grooming?: A Little Help from another person toileting, which includes using toliet, bedpan, or urinal?: A Lot Help from another person bathing (including washing, rinsing, drying)?: A Lot Help from another person to put on and taking off regular upper body clothing?: A Little Help from another person to put on and taking off regular lower body clothing?: A Lot 6 Click Score: 16   End of Session Equipment Utilized During Treatment: Gait belt Nurse Communication: Mobility status  Activity Tolerance: Patient tolerated treatment well Patient left: in bed;with call bell/phone within reach;with bed alarm set;with nursing/sitter in room;with family/visitor present  OT Visit Diagnosis: Other abnormalities of gait and mobility (R26.89);History of falling (Z91.81);Pain;Muscle weakness (generalized) (M62.81) Pain - Right/Left: Right Pain - part of body: Ankle and joints of foot                Time: 1610-9604 OT Time Calculation (min): 35 min Charges:  OT General Charges $OT Visit: 1 Visit OT Evaluation $OT Eval Moderate Complexity: 1  Mod  Presley Raddle OTR/L  Acute Rehab Services  [phone] office number   Alphia Moh 01/09/2023, 10:16 AM

## 2023-01-10 ENCOUNTER — Encounter (HOSPITAL_COMMUNITY): Payer: Self-pay | Admitting: Internal Medicine

## 2023-01-10 DIAGNOSIS — T383X5A Adverse effect of insulin and oral hypoglycemic [antidiabetic] drugs, initial encounter: Secondary | ICD-10-CM | POA: Diagnosis not present

## 2023-01-10 DIAGNOSIS — E160 Drug-induced hypoglycemia without coma: Secondary | ICD-10-CM | POA: Diagnosis not present

## 2023-01-10 LAB — BASIC METABOLIC PANEL WITH GFR
Anion gap: 14 (ref 5–15)
BUN: 35 mg/dL — ABNORMAL HIGH (ref 8–23)
CO2: 21 mmol/L — ABNORMAL LOW (ref 22–32)
Calcium: 8.3 mg/dL — ABNORMAL LOW (ref 8.9–10.3)
Chloride: 106 mmol/L (ref 98–111)
Creatinine, Ser: 2.85 mg/dL — ABNORMAL HIGH (ref 0.61–1.24)
GFR, Estimated: 23 mL/min — ABNORMAL LOW
Glucose, Bld: 190 mg/dL — ABNORMAL HIGH (ref 70–99)
Potassium: 4 mmol/L (ref 3.5–5.1)
Sodium: 141 mmol/L (ref 135–145)

## 2023-01-10 LAB — MAGNESIUM: Magnesium: 2.1 mg/dL (ref 1.7–2.4)

## 2023-01-10 LAB — GLUCOSE, CAPILLARY
Glucose-Capillary: 175 mg/dL — ABNORMAL HIGH (ref 70–99)
Glucose-Capillary: 209 mg/dL — ABNORMAL HIGH (ref 70–99)
Glucose-Capillary: 215 mg/dL — ABNORMAL HIGH (ref 70–99)
Glucose-Capillary: 239 mg/dL — ABNORMAL HIGH (ref 70–99)

## 2023-01-10 MED ORDER — INSULIN GLARGINE-YFGN 100 UNIT/ML ~~LOC~~ SOLN
10.0000 [IU] | Freq: Every day | SUBCUTANEOUS | Status: DC
  Administered 2023-01-10: 10 [IU] via SUBCUTANEOUS
  Filled 2023-01-10 (×2): qty 0.1

## 2023-01-10 NOTE — Progress Notes (Signed)
PROGRESS NOTE    JEKAI BARBERI  ZOX:[id number] DOB: 08-Oct-1955 DOA: 01/08/2023 PCP: [name], MD    Brief Narrative:   Harold [name] is a 67 y.o. male with past medical history significant for HTN, HLD, CAD s/p PCI, A-fib on Eliquis, COPD, OSA on nightly CPAP, DM2 with renal complications and peripheral neuropathy, stage IIIb CKD, PAD s/p stents, anxiety and depression, gout, tobacco abuse, orthostatic hypotension, frequent falls, recent admission 11/14 - 19 after ground level fall c/b open right ankle fracture s/p ORIF on 11/14 who presented to [facility] ED via EMS from [facility] after being found down on the ground following an apparent fall, associated with confusion.  He was noted to be hypoglycemics with glucose in the 40's. Patient reports decreased PO intake at SNF due to quality of the food there. Evening prior to admission, he ate a [place] sandwich which is less than typical, and thinks he received full dose of Humulin R 150 units with this. Had also received normal dose of Tresiba 50 units in the morning. Felt normal around 7PM. His roommate at SNF noted that he had fallen out of bed and they called RN but were unable to have someone come to room until roommate wheeled himself out of room to get help. At time of EMS arrival BG in 40s and treated with D50. Patient feels his confusion is improving with treatment with D50 and D10 here in the ED. Overall no other changes in meds or symptomatically since going to SNF. Unhappy with the care there and wanting to speak with SW about potential change in SNF or options available.   Assessment & Plan:    Hypoglycemia associated with insulin use Hx DM type 2  Acute Metabolic encephalopathy secondary to hypoglycemia Hx recurrent falls out of bed, nature of fall PTA unknown due to patients AMS around this time. Found on the ground by roommate at SNF. On EMS arrival BG in 40s, and treated with D50. Has continued hypoglycemia via  fingerstick in ED and started on D10 gtt. His mental status has improved with treatment of hypoglycemia. Home insulin regimen is Tresiba 50 units in AM, Humulin R U500 50 U with breakfast and 150 U with dinner. Reason for hypoglycemia likely decreased PO intake along with continued high insulin dosage.  Initially requiring D10 drip which was slowly titrated off. -- Diabetic educator following, appreciate assistance -- Start Semglee 10 units [place] daily -- moderate SSI for coverage -- CBGs qAC/HS -- Gabapentin reduced to 300 mg at bedtime 2/2 encephalopathy (home is 300 mg BID)    Left 1st proximal phalanx fracture, mildly displaced, initial encounter  Ground-level fall out of bed X-ray left foot with fracture base left great toe proximal flailing status mildly displaced. -- Postoperative shoe, weightbearing as tolerates -- PT/OT recommending return to SNF, TOC consulted   Recent open fracture R ankle s/p ORIF on 11/14  Open right trimalleolar ankle fracture dislocation including syndesmosis s/p I&D and ORIF of fibula, medial malleolus, posterior malleolus and syndesmosis on 11/14. -- NWB x 8 weeks post op per Ortho; plan for splint to be converted to short leg cast at 2 wk post op.  -- PT/OT eval recommend return to SNF; patient and family request change in SNF placement; TOC following -- Continue Vit D3 5000 IU daily  -- Outpatient follow-up with orthopedics   Hypokalemia, asymptomatic, repleting  Repleted potassium -- BMP daily with magnesium   Resolving AKI stage II on CKD stage  III:  Baseline Cr 1.8 - 2. Last admission peaked at 4.7, thought to be related to hypotension, NSAID, lasix, losartan I.e. prerenal. Back to 2.1 at time of admission.  -- Cr 2.1>2.85 -- continue to hold losartan -- BMP in the am  Anemia, secondary to acute blood loss, improving:  Baseline Hb 13-14, nadir of 8.6 last admission. Back to 10 this admission.   HTN Hx orthostatic hypotension:  -- Continue home  Carvedilol. -- Losartan currently held from past admit -- continue to hold home furosemide for now (on 80mg  BID)  Hyperlipidemia:  -- Continue rosuvastatin 20mg  PO daily    Hypothyroid:  Recent TSH: 3.387 -- Continue home levothyroxine 88 mcg p.o. daily  OSA  -- Continue nocturnal CPAP  Paroxysmal atrial fibrillation:  -- Continue amiodarone, Eliquis  CAD/PAD/lower extremity venous stasis ulcers:  Hx CEA, cardiac and peripheral artery stents. -- Recently taken off Plavix and started on Eliquis.   -- Continue statin --  Follows with outpatient wound care.  Chronic diastolic CHF:  TTE 02/2022: LVEF 50-55%.  -- Diuretics currently held with hypokalemia -- strict I's and O's  Anxiety and depression: Continue home Abilify and Cymbalta  Ambulatory dysfunction/frequent falls/?  Orthostatic hypotension:  -- Check orthostatics supine to seated only given WB status. Falls also in part related to neuropathy.   -- PT/OT recommend return to SNF  Morbid obesity, class III Body mass index is 42.17 kg/m.  Patient on [facility] outpatient and efforts to lose weight.  Complicates all facets of care.    DVT prophylaxis:  apixaban (ELIQUIS) tablet 5 mg    Code Status: Full Code Family Communication: No family present bedside this morning  Disposition Plan:  Level of care: Med-Surg Status is: Inpatient Remains inpatient appropriate because: Restarting insulin today as titrated off of D10 drip yesterday.  Will need to return back to SNF, anticipate 1-2 days      Consultants:  None  Procedures:  None  Antimicrobials:  None   Subjective: Patient seen examined bedside, resting calmly.  Lying in bed.  Overall much improved.  Titrated off of D10 drip yesterday.  Glucose reasonable will start small dose of Semglee today.  Seen by PT and OT with recommendation of return to SNF.  TOC working on alternate facility.  No other specific questions or concerns at this time.  Denies headache,  no dizziness, no chest pain, no palpitations, no shortness of breath, no abdominal pain, no fever/chills/night sweats, no nausea cefonicid diarrhea, no focal weakness, no fatigue, no paresthesias.  No acute events overnight per nursing.  Objective: Vitals:   01/09/23 1634 01/09/23 1940 01/10/23 0530 01/10/23 0737  BP: 128/64 129/62 128/66 116/66  Pulse: 68 65 65 62  Resp: 16 18 19 17   Temp: 97.7 F (36.5 C) 97.9 F (36.6 C) 98 F (36.7 C) 97.7 F (36.5 C)  TempSrc: Oral Oral Oral Oral  SpO2: 97% 97% 98% 96%  Weight:      Height:        Intake/Output Summary (Last 24 hours) at 01/10/2023 1244 Last data filed at 01/10/2023 0900 Gross per 24 hour  Intake 480 ml  Output 700 ml  Net -220 ml   Filed Weights   01/09/23 0024  Weight: (!) 145 kg    Examination:  Physical Exam: GEN: NAD, alert and oriented x 3, morbidly obese, chronically ill in appearance HEENT: NCAT, PERRL, EOMI, sclera clear, MMM PULM: CTAB w/o wheezes/crackles, normal respiratory effort, on room air CV: RRR w/o  M/G/R GI: abd soft, NTND, NABS, no R/G/M MSK: no peripheral edema, muscle strength globally intact 5/5 bilateral upper/lower extremities NEURO: CN II-XII intact, no focal deficits, sensation to light touch intact PSYCH: normal mood/affect Integumentary: Right forearm abrasion noted with 1 cm hemorrhagic bulla in the webspace between first and second digit right hand, right lower extremity with dressing/splint in place, left foot with ecchymosis greatest at the great toe, otherwise no other concerning rashes/lesions/wounds noted on exposed skin surfaces.      Data Reviewed: I have personally reviewed following labs and imaging studies  CBC: Recent Labs  Lab 01/04/23 0528 01/05/23 0609 01/06/23 0817 01/07/23 0737 01/08/23 2356 01/09/23 0004  WBC 13.7* 8.6 6.0 8.0 9.4  --   NEUTROABS  --   --   --   --  7.0  --   HGB 9.2* 8.6* 8.8* 9.5* 10.3* 10.2*  HCT 26.6* 25.0* 26.0* 27.6* 30.3* 30.0*   MCV 89.6 91.2 92.2 91.1 91.8  --   PLT 187 175 159 194 220  --    Basic Metabolic Panel: Recent Labs  Lab 01/03/23 1432 01/04/23 0528 01/04/23 0528 01/05/23 0609 01/06/23 0817 01/07/23 0737 01/08/23 2356 01/09/23 0004 01/09/23 0622 01/10/23 0725  NA  --  138   < > 138 139 140 139 140 138 141  K  --  3.2*   < > 3.3* 4.2 3.6 3.0* 2.9* 3.8 4.0  CL  --  101   < > 106 107 104 103 100 101 106  CO2  --  26   < > 24 27 28 26   --  29 21*  GLUCOSE  --  138*   < > 85 205* 115* 662* 690* 120* 190*  BUN  --  62*   < > 57* 48* 41* 32* 31* 31* 35*  CREATININE  --  4.49*   < > 3.46* 2.96* 2.39* 2.18* 2.10* 2.18* 2.85*  CALCIUM  --  8.4*   < > 8.3* 8.5* 8.6* 8.6*  --  8.6* 8.3*  MG 2.1  --   --   --   --   --   --   --  2.2 2.1  PHOS  --  4.6  --  4.1 2.7  --   --   --  3.4  --    < > = values in this interval not displayed.   GFR: Estimated Creatinine Clearance: 36.6 mL/min (A) (by C-G formula based on SCr of 2.85 mg/dL (H)). Liver Function Tests: Recent Labs  Lab 01/04/23 0528 01/05/23 0609 01/06/23 0817 01/08/23 2356  AST  --   --   --  29  ALT  --   --   --  16  ALKPHOS  --   --   --  68  BILITOT  --   --   --  0.7  PROT  --   --   --  5.7*  ALBUMIN 2.8* 2.6* 2.5* 3.0*   No results for input(s): "LIPASE", "AMYLASE" in the last 168 hours. No results for input(s): "AMMONIA" in the last 168 hours. Coagulation Profile: No results for input(s): "INR", "PROTIME" in the last 168 hours. Cardiac Enzymes: Recent Labs  Lab 01/04/23 0528 01/08/23 2356  CKTOTAL 319 207   BNP (last 3 results) No results for input(s): "PROBNP" in the last 8760 hours. HbA1C: No results for input(s): "HGBA1C" in the last 72 hours. CBG: Recent Labs  Lab 01/09/23 1115 01/09/23 1635 01/09/23 2050 01/10/23 4098  01/10/23 1123  GLUCAP 136* 158* 181* 175* 209*   Lipid Profile: No results for input(s): "CHOL", "HDL", "LDLCALC", "TRIG", "CHOLHDL", "LDLDIRECT" in the last 72 hours. Thyroid Function  Tests: No results for input(s): "TSH", "T4TOTAL", "FREET4", "T3FREE", "THYROIDAB" in the last 72 hours. Anemia Panel: No results for input(s): "VITAMINB12", "FOLATE", "FERRITIN", "TIBC", "IRON", "RETICCTPCT" in the last 72 hours. Sepsis Labs: No results for input(s): "PROCALCITON", "LATICACIDVEN" in the last 168 hours.  Recent Results (from the past 240 hour(s))  Surgical pcr screen     Status: Abnormal   Collection Time: 01/02/23  9:31 AM   Specimen: Nasal Mucosa; Nasal Swab  Result Value Ref Range Status   MRSA, PCR POSITIVE (A) NEGATIVE Final    Comment: RESULT CALLED TO, READ BACK BY AND VERIFIED WITH: [place] ON 11424 @1210H  BY SM    Staphylococcus aureus POSITIVE (A) NEGATIVE Final    Comment: (NOTE) The Xpert SA Assay (FDA approved for NASAL specimens in patients 22 years of age and older), is one component of a comprehensive surveillance program. It is not intended to diagnose infection nor to guide or monitor treatment. Performed at [facility], [address] [place], [place] [address]          Radiology Studies: DG Foot Complete Left  Result Date: 01/09/2023 CLINICAL DATA:  Fall, left foot pain EXAM: LEFT FOOT - COMPLETE 3+ VIEW COMPARISON:  09/09/2019 FINDINGS: Fracture noted at the base of the left 1st proximal phalanx. Fracture fragment mildly displaced. Degenerative changes at the 1st MTP joint and 1st IP joint. No subluxation or dislocation. Plantar calcaneal spur. Soft tissues are intact. IMPRESSION: Fracture at the base of the left great toe proximal phalanx. Electronically Signed   By: Charlett Nose M.D.   On: 01/09/2023 02:02   CT Cervical Spine Wo Contrast  Result Date: 01/09/2023 CLINICAL DATA:  Fall, on blood thinners EXAM: CT CERVICAL SPINE WITHOUT CONTRAST TECHNIQUE: Multidetector CT imaging of the cervical spine was performed without intravenous contrast. Multiplanar CT image reconstructions were also generated. RADIATION DOSE REDUCTION:  This exam was performed according to the departmental dose-optimization program which includes automated exposure control, adjustment of the mA and/or kV according to patient size and/or use of iterative reconstruction technique. COMPARISON:  None available FINDINGS: Alignment: Normal. Skull base and vertebrae: No acute fracture. No primary bone lesion or focal pathologic process. Soft tissues and spinal canal: No prevertebral fluid or swelling. No visible canal hematoma. Disc levels: Early anterior spurring in the mid and lower cervical spine. Mild degenerative facet disease bilaterally. Upper chest: No acute findings Other: None IMPRESSION: No acute bony abnormality. Electronically Signed   By: Charlett Nose M.D.   On: 01/09/2023 01:08   CT Head Wo Contrast  Result Date: 01/09/2023 CLINICAL DATA:  Fall, on blood thinners. EXAM: CT HEAD WITHOUT CONTRAST TECHNIQUE: Contiguous axial images were obtained from the base of the skull through the vertex without intravenous contrast. RADIATION DOSE REDUCTION: This exam was performed according to the departmental dose-optimization program which includes automated exposure control, adjustment of the mA and/or kV according to patient size and/or use of iterative reconstruction technique. COMPARISON:  None Available. FINDINGS: Brain: Diffuse cerebral atrophy. No acute intracranial abnormality. Specifically, no hemorrhage, hydrocephalus, mass lesion, acute infarction, or significant intracranial injury. Vascular: No hyperdense vessel or unexpected calcification. Skull: No acute calvarial abnormality. Sinuses/Orbits: No acute findings Other: None IMPRESSION: Atrophy.  No acute intracranial abnormality. Electronically Signed   By: Charlett Nose M.D.  On: 01/09/2023 01:07        Scheduled Meds:  allopurinol  100 mg Oral Daily   amiodarone  200 mg Oral Daily   apixaban  5 mg Oral BID   ARIPiprazole  5 mg Oral Daily   carvedilol  3.125 mg Oral BID WC    cholecalciferol  125 mcg Oral Daily   DULoxetine  60 mg Oral Daily   ezetimibe  10 mg Oral Daily   gabapentin  300 mg Oral QHS   insulin aspart  0-15 Units Subcutaneous TID WC   insulin glargine-yfgn  10 Units Subcutaneous Daily   levothyroxine  88 mcg Oral QAC breakfast   pantoprazole  40 mg Oral Daily   rosuvastatin  20 mg Oral QPM   sodium chloride flush  3 mL Intravenous Q12H   Continuous Infusions:     LOS: 1 day    Time spent: 56 minutes spent on chart review, discussion with nursing staff, consultants, updating family and interview/physical exam; more than 50% of that time was spent in counseling and/or coordination of care.    Alvira Philips [place], DO [facility] Available via Epic secure chat 7am-7pm After these hours, please refer to coverage provider listed on [email or web address] 01/10/2023, 12:44 PM

## 2023-01-10 NOTE — TOC Progression Note (Addendum)
Transition of Care [facility]) - Progression Note    Patient Details  Name: Harold [name] MRN: [id number] Date of Birth: 02-11-1956  [facility] CM/SW Contact  Lorri Frederick, LCSW Phone Number: 01/10/2023, 10:05 AM  Clinical Narrative:   CSW reached out to [facility], she is reviewing referral with her RN, will call back if can offer.  Bed offer received from [facility].  CSW provided the above info to pt, he will review.  1500: [facility] cannot offer.  Pt updated. He is not sure what he wants to do at this point, will talk with his husband.  Expected Discharge Plan: Skilled Nursing Facility Barriers to Discharge: Continued Medical Work up, SNF Pending bed offer  Expected Discharge Plan and Services In-house Referral: Clinical Social Work   Post Acute Care Choice: Skilled Nursing Facility Living arrangements for the past 2 months: Single Family Home                                       Social Determinants of Health (SDOH) Interventions SDOH Screenings   Food Insecurity: No Food Insecurity (01/09/2023)  Housing: Low Risk  (01/09/2023)  Transportation Needs: No Transportation Needs (01/09/2023)  Utilities: Not At Risk (01/09/2023)  Tobacco Use: Medium Risk (01/02/2023)    Readmission Risk Interventions     No data to display

## 2023-01-10 NOTE — Progress Notes (Signed)
Physical Therapy Treatment Patient Details Name: Harold [name] MRN: [id number] DOB: 09-14-1955 Today's Date: 01/10/2023   History of Present Illness Pt is a 67 y/o male who recently had an admission on 11/14 after a fall and had a R ankle ORIF with NWB. Pt now was admitted on 01/08/23 due to another fall at [facility]. He was found to have a L 1st proximal phalanx fx. Pt significant PMH: HTN, HLD, CAD s/p PCI, COPD, DM2, CKD stage IIIb, spinal fusions and lumbar laminectomies (multiple sx-2005, 2010, 2013), PVD, gout, OSA.    PT Comments  Pt received in supine, pleasantly cooperative and eager to progress OOB to chair mobility. Pt unable to recall his BLE WB precautions, precs written on board to reinforce with pt/staff. Pt needing up to maxA for posterior scoot from bed to chair with bed pad assist and multimodal cues for weight shifting and improved technique. Hoyer pad placed behind pt in chair for his safety as pt admits he had not been wearing LLE post-op shoe and had been standing with full WB on LLE (and likely non-compliant with RLE NWB restriction) prior to PTA arrival to room. Pt agreeable to try using male urinal while seated in the future to prevent non-compliance when toileting. Need to clarify LLE WB status as MD note states WBAT with post-op shoe and therapy evaluations state PWB with post-op shoe, PTA used more conservative method and avoided WB through LLE throughout session for pt safety given his previous difficulty with compliance. Plan to work on lateral scoot transfer vs squat pivot transfer to drop arm chair or wheelchair next session. Pt will continue to benefit from skilled rehab in a post acute setting <3 hours/day to maximize functional gains before returning home.    If plan is discharge home, recommend the following: Two people to help with walking and/or transfers;A lot of help with bathing/dressing/bathroom   Can travel by private vehicle     No  Equipment  Recommendations  Other (comment) (Per accepting facility)    Recommendations for Other Services       Precautions / Restrictions Precautions Precautions: Fall Precaution Comments: multiple falls at SNF reported Required Braces or Orthoses: Splint/Cast Splint/Cast: hard, on RLE Splint/Cast - Date Prophylactic Dressing Applied (if applicable): 01/02/23 Restrictions Weight Bearing Restrictions: Yes RLE Weight Bearing: Non weight bearing LLE Weight Bearing: Partial weight bearing Other Position/Activity Restrictions: partial weight bearing on the LLE with post op shoe, ortho was consulted     Mobility  Bed Mobility Overal bed mobility: Needs Assistance Bed Mobility: Supine to Sit     Supine to sit: Supervision, HOB elevated, Used rails     General bed mobility comments: no physical assist required, cues to avoid RLE WB    Transfers Overall transfer level: Needs assistance   Transfers: Bed to chair/wheelchair/BSC         Anterior-Posterior transfers: Max assist   General transfer comment: increased time/effort for posterior scoot transfer from bed to recliner (no drop arm chair in room), pt performs with modA to maxA +1, increased time/effort, mod cues for activity pacing and safety, visual/verbal demo prior. Pt has difficulty sitting upright during transfer due to hx of spinal fusions per his report, tends to lean backward.    Ambulation/Gait               General Gait Details: defer due to pt WB status and no +2 assist available at time of session   Stairs  Wheelchair Mobility     Tilt Bed    Modified Rankin (Stroke Patients Only)       Balance Overall balance assessment: Needs assistance Sitting-balance support: Feet supported Sitting balance-Leahy Scale: Fair Sitting balance - Comments: EOB, difficulty with dynamic balance while weight shifting sitting EOB during scoot transfer       Standing balance comment: defer for pt  safety Pt observed to be standing with +1 assist of spouse after session (pt pulling up boxer briefs after donning new pair), likely non-compliant with BLE restrictions, by time PTA got into room he was sitting again; pt will need reinforcement.                             Cognition Arousal: Alert Behavior During Therapy: WFL for tasks assessed/performed Overall Cognitive Status: Impaired/Different from baseline Area of Impairment: Safety/judgement                         Safety/Judgement: Decreased awareness of safety              Exercises      General Comments General comments (skin integrity, edema, etc.): SpO2/HR WFL on RA, elevated RR with posterior scoot and difficulty achieving all the way to chair, so lift pad placed under pt in chair in case nursing staff needs to use hoyer to get him back to bed safely. PTA brought more ergonomic urinal ("male urinal") to room as pt reports he cannot urinate in universal type wtihout standing and currently not safe to stand on his own due to [facility] status. Discussion on scoot transfers vs squat pivot to bari [facility] with +2 assist for pt safety. Spouse has mobility issues and cannot lift pt safely on his own.      Pertinent Vitals/Pain Pain Assessment Pain Assessment: Faces Faces Pain Scale: Hurts even more Pain Location: R ankle and back with attempt at posterior scoot into chair Pain Descriptors / Indicators: Discomfort, Grimacing, Guarding    Home Living                          Prior Function            PT Goals (current goals can now be found in the care plan section) Acute Rehab PT Goals Patient Stated Goal: go to rehab PT Goal Formulation: With patient/family Time For Goal Achievement: 01/23/23 Progress towards PT goals: Progressing toward goals    Frequency    Min 1X/week      PT Plan      Co-evaluation              AM-PAC PT "6 Clicks" Mobility   Outcome Measure  Help  needed turning from your back to your side while in a flat bed without using bedrails?: A Little Help needed moving from lying on your back to sitting on the side of a flat bed without using bedrails?: A Little (w/o rail and flat bed) Help needed moving to and from a bed to a chair (including a wheelchair)?: A Lot Help needed standing up from a chair using your arms (e.g., wheelchair or bedside chair)?: Total Help needed to walk in hospital room?: Total Help needed climbing 3-5 steps with a railing? : Total 6 Click Score: 11    End of Session Equipment Utilized During Treatment: Other (comment) (LLE Post-op shoe) Activity Tolerance: Patient tolerated treatment well;Patient  limited by pain;Other (comment) (RLE and chronic back pain limiting scooting tolerance) Patient left: with call bell/phone within reach;with family/visitor present;in chair;Other (comment) (spouse [name] present; hoyer pad behind him in chair; additional recliner (drop arm) and bari BSC brought to pt's room (also chair alarm brought to room, none was in recliner when PTA arrived, chair was set up did not realize til after)) Nurse Communication: Mobility status PT Visit Diagnosis: Other abnormalities of gait and mobility (R26.89)     Time: 1700-1733 PT Time Calculation (min) (ACUTE ONLY): 33 min  Charges:    $Therapeutic Activity: 23-37 mins PT General Charges $$ ACUTE PT VISIT: 1 Visit                     Ariona Deschene P., PTA Acute Rehabilitation Services Secure Chat Preferred 9a-5:30pm Office: [phone]    Dorathy Kinsman [facility] 01/10/2023, 6:33 PM

## 2023-01-11 DIAGNOSIS — T383X5A Adverse effect of insulin and oral hypoglycemic [antidiabetic] drugs, initial encounter: Secondary | ICD-10-CM | POA: Diagnosis not present

## 2023-01-11 DIAGNOSIS — E160 Drug-induced hypoglycemia without coma: Secondary | ICD-10-CM | POA: Diagnosis not present

## 2023-01-11 LAB — BASIC METABOLIC PANEL
Anion gap: 8 (ref 5–15)
BUN: 31 mg/dL — ABNORMAL HIGH (ref 8–23)
CO2: 26 mmol/L (ref 22–32)
Calcium: 8.3 mg/dL — ABNORMAL LOW (ref 8.9–10.3)
Chloride: 106 mmol/L (ref 98–111)
Creatinine, Ser: 2.48 mg/dL — ABNORMAL HIGH (ref 0.61–1.24)
GFR, Estimated: 27 mL/min — ABNORMAL LOW (ref >60)
Glucose, Bld: 197 mg/dL — ABNORMAL HIGH (ref 70–99)
Potassium: 3.5 mmol/L (ref 3.5–5.1)
Sodium: 140 mmol/L (ref 135–145)

## 2023-01-11 LAB — GLUCOSE, CAPILLARY
Glucose-Capillary: 193 mg/dL — ABNORMAL HIGH (ref 70–99)
Glucose-Capillary: 232 mg/dL — ABNORMAL HIGH (ref 70–99)
Glucose-Capillary: 209 mg/dL — ABNORMAL HIGH (ref 70–99)
Glucose-Capillary: 155 mg/dL — ABNORMAL HIGH (ref 70–99)

## 2023-01-11 MED ORDER — FLUTICASONE PROPIONATE 50 MCG/ACT NA SUSP
2.0000 | Freq: Every day | NASAL | Status: DC
  Administered 2023-01-11 – 2023-01-14 (×4): 2 via NASAL
  Filled 2023-01-11: qty 16

## 2023-01-11 MED ORDER — INSULIN ASPART 100 UNIT/ML IJ SOLN
5.0000 [IU] | Freq: Three times a day (TID) | INTRAMUSCULAR | Status: DC
  Administered 2023-01-12 – 2023-01-13 (×4): 5 [IU] via SUBCUTANEOUS

## 2023-01-11 MED ORDER — INSULIN GLARGINE-YFGN 100 UNIT/ML ~~LOC~~ SOLN
20.0000 [IU] | Freq: Every day | SUBCUTANEOUS | Status: DC
  Administered 2023-01-11: 20 [IU] via SUBCUTANEOUS
  Filled 2023-01-11 (×2): qty 0.2

## 2023-01-11 MED ORDER — LORATADINE 10 MG PO TABS
10.0000 mg | ORAL_TABLET | Freq: Every day | ORAL | Status: DC
  Administered 2023-01-11 – 2023-01-14 (×4): 10 mg via ORAL
  Filled 2023-01-11 (×4): qty 1

## 2023-01-11 MED ORDER — POTASSIUM CHLORIDE CRYS ER 20 MEQ PO TBCR
40.0000 meq | EXTENDED_RELEASE_TABLET | Freq: Once | ORAL | Status: AC
  Administered 2023-01-11: 40 meq via ORAL
  Filled 2023-01-11: qty 2

## 2023-01-11 NOTE — Progress Notes (Signed)
IV Team consult placed for IV start. Pt does not have any ivfs/meds ordered via IV. Primary RN to notify MD regarding need for IV placement. IV Team will remain available as needed.

## 2023-01-11 NOTE — Progress Notes (Signed)
PROGRESS NOTE    TRU ROVER  QMV:[id number] DOB: Dec 12, 1955 DOA: 01/08/2023 PCP: [name], MD    Brief Narrative:   Harold [name] is a 67 y.o. male with past medical history significant for HTN, HLD, CAD s/p PCI, A-fib on Eliquis, COPD, OSA on nightly CPAP, DM2 with renal complications and peripheral neuropathy, stage IIIb CKD, PAD s/p stents, anxiety and depression, gout, tobacco abuse, orthostatic hypotension, frequent falls, recent admission 11/14 - 19 after ground level fall c/b open right ankle fracture s/p ORIF on 11/14 who presented to [facility] ED via EMS from [facility] after being found down on the ground following an apparent fall, associated with confusion.  He was noted to be hypoglycemics with glucose in the 40's. Patient reports decreased PO intake at SNF due to quality of the food there. Evening prior to admission, he ate a [place] sandwich which is less than typical, and thinks he received full dose of Humulin R 150 units with this. Had also received normal dose of Tresiba 50 units in the morning. Felt normal around 7PM. His roommate at SNF noted that he had fallen out of bed and they called RN but were unable to have someone come to room until roommate wheeled himself out of room to get help. At time of EMS arrival BG in 40s and treated with D50. Patient feels his confusion is improving with treatment with D50 and D10 here in the ED. Overall no other changes in meds or symptomatically since going to SNF. Unhappy with the care there and wanting to speak with SW about potential change in SNF or options available.   Assessment & Plan:    Hypoglycemia associated with insulin use Hx DM type 2  Acute Metabolic encephalopathy secondary to hypoglycemia Hx recurrent falls out of bed, nature of fall PTA unknown due to patients AMS around this time. Found on the ground by roommate at SNF. On EMS arrival BG in 40s, and treated with D50. Has continued hypoglycemia via  fingerstick in ED and started on D10 gtt. His mental status has improved with treatment of hypoglycemia. Home insulin regimen is Tresiba 50 units in AM, Humulin R U500 50 U with breakfast and 150 U with dinner. Reason for hypoglycemia likely decreased PO intake along with continued high insulin dosage.  Initially requiring D10 drip which was slowly titrated off. -- Diabetic educator following, appreciate assistance -- Semglee 20 units [place] daily -- Novolog 5u TIDAC -- moderate SSI for coverage -- CBGs qAC/HS -- Gabapentin reduced to 300 mg at bedtime 2/2 encephalopathy (home is 300 mg BID)    Left 1st proximal phalanx fracture, mildly displaced, initial encounter  Ground-level fall out of bed X-ray left foot with fracture base left great toe proximal flailing status mildly displaced. -- Postoperative shoe, weightbearing as tolerates -- PT/OT recommending return to SNF, TOC consulted   Recent open fracture R ankle s/p ORIF on 11/14  Open right trimalleolar ankle fracture dislocation including syndesmosis s/p I&D and ORIF of fibula, medial malleolus, posterior malleolus and syndesmosis on 11/14. -- NWB x 8 weeks post op per Ortho; plan for splint to be converted to short leg cast at 2 wk post op.  -- PT/OT eval recommend return to SNF; patient and family request change in SNF placement; TOC following -- Continue Vit D3 5000 IU daily  -- Outpatient follow-up with orthopedics   Hypokalemia Potassium 3.5, will replete. -- BMP daily    Resolving AKI stage II on CKD  stage III:  Baseline Cr 1.8 - 2. Last admission peaked at 4.7, thought to be related to hypotension, NSAID, lasix, losartan I.e. prerenal. Back to 2.1 at time of admission.  -- Cr 2.1>2.85>2.48 -- continue to hold losartan -- BMP in the am  Anemia, secondary to acute blood loss, improving:  Baseline Hb 13-14, nadir of 8.6 last admission. Back to 10 this admission.   HTN Hx orthostatic hypotension:  -- Continue home  Carvedilol. -- Losartan currently held from past admit -- continue to hold home furosemide for now (on 80mg  BID)  Hyperlipidemia:  -- Continue rosuvastatin 20mg  PO daily    Hypothyroid:  Recent TSH: 3.387 -- Continue home levothyroxine 88 mcg p.o. daily  OSA  -- Continue nocturnal CPAP  Paroxysmal atrial fibrillation:  -- Continue amiodarone, Eliquis  CAD/PAD/lower extremity venous stasis ulcers:  Hx CEA, cardiac and peripheral artery stents. -- Recently taken off Plavix and started on Eliquis.   -- Continue statin --  Follows with outpatient wound care.  Chronic diastolic CHF:  TTE 02/2022: LVEF 50-55%.  -- Diuretics currently held with hypokalemia -- strict I's and O's  Anxiety and depression: Continue home Abilify and Cymbalta  Ambulatory dysfunction/frequent falls/?  Orthostatic hypotension:  -- Check orthostatics supine to seated only given WB status. Falls also in part related to neuropathy.   -- PT/OT recommend return to SNF  Morbid obesity, class III Body mass index is 42.17 kg/m.  Patient on [facility] outpatient and efforts to lose weight.  Complicates all facets of care.    DVT prophylaxis:  apixaban (ELIQUIS) tablet 5 mg    Code Status: Full Code Family Communication: No family present bedside this morning  Disposition Plan:  Level of care: Med-Surg Status is: Inpatient Remains inpatient appropriate because: Awaiting SNF placement, per TOC      Consultants:  None  Procedures:  None  Antimicrobials:  None   Subjective: Patient seen examined bedside, resting calmly.  Lying in bed.  RN present at bedside.  Glucose continues to rise, further uptitrating insulin regimen today.   TOC working on alternate SNF facility.  No other specific questions or concerns at this time.  Denies headache, no dizziness, no chest pain, no palpitations, no shortness of breath, no abdominal pain, no fever/chills/night sweats, no nausea/vomiting/diarrhea, no focal  weakness, no fatigue, no paresthesias.  No acute events overnight per nursing staff.  Objective: Vitals:   01/10/23 1940 01/10/23 2228 01/11/23 0330 01/11/23 0745  BP: (!) 147/60  (!) 161/66 (!) 145/60  Pulse: 66 65 65 61  Resp: 16 14 18 18   Temp: 98.2 F (36.8 C)  97.6 F (36.4 C) 98.3 F (36.8 C)  TempSrc: Oral  Oral Oral  SpO2: 100% 100% 100% 99%  Weight:      Height:        Intake/Output Summary (Last 24 hours) at 01/11/2023 1147 Last data filed at 01/11/2023 1000 Gross per 24 hour  Intake 840 ml  Output 1100 ml  Net -260 ml   Filed Weights   01/09/23 0024  Weight: (!) 145 kg    Examination:  Physical Exam: GEN: NAD, alert and oriented x 3, morbidly obese, chronically ill in appearance HEENT: NCAT, PERRL, EOMI, sclera clear, MMM PULM: CTAB w/o wheezes/crackles, normal respiratory effort, on room air CV: RRR w/o M/G/R GI: abd soft, NTND, NABS, no R/G/M MSK: no peripheral edema, muscle strength globally intact 5/5 bilateral upper/lower extremities NEURO: CN II-XII intact, no focal deficits, sensation to light touch intact  PSYCH: normal mood/affect Integumentary: Right forearm abrasion noted with 1 cm hemorrhagic bulla in the webspace between first and second digit right hand, right lower extremity with dressing/splint in place, left foot with ecchymosis greatest at the great toe, otherwise no other concerning rashes/lesions/wounds noted on exposed skin surfaces.      Data Reviewed: I have personally reviewed following labs and imaging studies  CBC: Recent Labs  Lab 01/05/23 0609 01/06/23 0817 01/07/23 0737 01/08/23 2356 01/09/23 0004  WBC 8.6 6.0 8.0 9.4  --   NEUTROABS  --   --   --  7.0  --   HGB 8.6* 8.8* 9.5* 10.3* 10.2*  HCT 25.0* 26.0* 27.6* 30.3* 30.0*  MCV 91.2 92.2 91.1 91.8  --   PLT 175 159 194 220  --    Basic Metabolic Panel: Recent Labs  Lab 01/05/23 0609 01/06/23 0817 01/07/23 0737 01/08/23 2356 01/09/23 0004 01/09/23 0622  01/10/23 0725 01/11/23 0528  NA 138 139 140 139 140 138 141 140  K 3.3* 4.2 3.6 3.0* 2.9* 3.8 4.0 3.5  CL 106 107 104 103 100 101 106 106  CO2 24 27 28 26   --  29 21* 26  GLUCOSE 85 205* 115* 662* 690* 120* 190* 197*  BUN 57* 48* 41* 32* 31* 31* 35* 31*  CREATININE 3.46* 2.96* 2.39* 2.18* 2.10* 2.18* 2.85* 2.48*  CALCIUM 8.3* 8.5* 8.6* 8.6*  --  8.6* 8.3* 8.3*  MG  --   --   --   --   --  2.2 2.1  --   PHOS 4.1 2.7  --   --   --  3.4  --   --    GFR: Estimated Creatinine Clearance: 42.1 mL/min (A) (by C-G formula based on SCr of 2.48 mg/dL (H)). Liver Function Tests: Recent Labs  Lab 01/05/23 0609 01/06/23 0817 01/08/23 2356  AST  --   --  29  ALT  --   --  16  ALKPHOS  --   --  68  BILITOT  --   --  0.7  PROT  --   --  5.7*  ALBUMIN 2.6* 2.5* 3.0*   No results for input(s): "LIPASE", "AMYLASE" in the last 168 hours. No results for input(s): "AMMONIA" in the last 168 hours. Coagulation Profile: No results for input(s): "INR", "PROTIME" in the last 168 hours. Cardiac Enzymes: Recent Labs  Lab 01/08/23 2356  CKTOTAL 207   BNP (last 3 results) No results for input(s): "PROBNP" in the last 8760 hours. HbA1C: No results for input(s): "HGBA1C" in the last 72 hours. CBG: Recent Labs  Lab 01/10/23 1123 01/10/23 1610 01/10/23 2121 01/11/23 0728 01/11/23 1136  GLUCAP 209* 215* 239* 193* 232*   Lipid Profile: No results for input(s): "CHOL", "HDL", "LDLCALC", "TRIG", "CHOLHDL", "LDLDIRECT" in the last 72 hours. Thyroid Function Tests: No results for input(s): "TSH", "T4TOTAL", "FREET4", "T3FREE", "THYROIDAB" in the last 72 hours. Anemia Panel: No results for input(s): "VITAMINB12", "FOLATE", "FERRITIN", "TIBC", "IRON", "RETICCTPCT" in the last 72 hours. Sepsis Labs: No results for input(s): "PROCALCITON", "LATICACIDVEN" in the last 168 hours.  Recent Results (from the past 240 hour(s))  Surgical pcr screen     Status: Abnormal   Collection Time: 01/02/23  9:31 AM    Specimen: Nasal Mucosa; Nasal Swab  Result Value Ref Range Status   MRSA, PCR POSITIVE (A) NEGATIVE Final    Comment: RESULT CALLED TO, READ BACK BY AND VERIFIED WITH: [place] ON 11424 @1210H  BY  SM    Staphylococcus aureus POSITIVE (A) NEGATIVE Final    Comment: (NOTE) The Xpert SA Assay (FDA approved for NASAL specimens in patients 5 years of age and older), is one component of a comprehensive surveillance program. It is not intended to diagnose infection nor to guide or monitor treatment. Performed at [facility], [address] [place], [place] [address]          Radiology Studies: No results found.      Scheduled Meds:  allopurinol  100 mg Oral Daily   amiodarone  200 mg Oral Daily   apixaban  5 mg Oral BID   ARIPiprazole  5 mg Oral Daily   carvedilol  3.125 mg Oral BID WC   cholecalciferol  125 mcg Oral Daily   DULoxetine  60 mg Oral Daily   ezetimibe  10 mg Oral Daily   fluticasone  2 spray Each Nare Daily   gabapentin  300 mg Oral QHS   insulin aspart  0-15 Units Subcutaneous TID WC   insulin aspart  5 Units Subcutaneous TID WC   insulin glargine-yfgn  20 Units Subcutaneous Daily   levothyroxine  88 mcg Oral QAC breakfast   loratadine  10 mg Oral Daily   pantoprazole  40 mg Oral Daily   rosuvastatin  20 mg Oral QPM   sodium chloride flush  3 mL Intravenous Q12H   Continuous Infusions:     LOS: 2 days    Time spent: 56 minutes spent on chart review, discussion with nursing staff, consultants, updating family and interview/physical exam; more than 50% of that time was spent in counseling and/or coordination of care.    Alvira Philips [place], DO [facility] Available via Epic secure chat 7am-7pm After these hours, please refer to coverage provider listed on [email or web address] 01/11/2023, 11:47 AM

## 2023-01-11 NOTE — Progress Notes (Addendum)
May leave IV out at this time per Chinita Greenland, NP.

## 2023-01-12 DIAGNOSIS — E160 Drug-induced hypoglycemia without coma: Secondary | ICD-10-CM | POA: Diagnosis not present

## 2023-01-12 DIAGNOSIS — T383X5A Adverse effect of insulin and oral hypoglycemic [antidiabetic] drugs, initial encounter: Secondary | ICD-10-CM | POA: Diagnosis not present

## 2023-01-12 LAB — BASIC METABOLIC PANEL
Anion gap: 9 (ref 5–15)
BUN: 32 mg/dL — ABNORMAL HIGH (ref 8–23)
CO2: 24 mmol/L (ref 22–32)
Calcium: 8.4 mg/dL — ABNORMAL LOW (ref 8.9–10.3)
Chloride: 104 mmol/L (ref 98–111)
Creatinine, Ser: 2.54 mg/dL — ABNORMAL HIGH (ref 0.61–1.24)
GFR, Estimated: 27 mL/min — ABNORMAL LOW (ref >60)
Glucose, Bld: 237 mg/dL — ABNORMAL HIGH (ref 70–99)
Potassium: 3.9 mmol/L (ref 3.5–5.1)
Sodium: 137 mmol/L (ref 135–145)

## 2023-01-12 LAB — GLUCOSE, CAPILLARY
Glucose-Capillary: 204 mg/dL — ABNORMAL HIGH (ref 70–99)
Glucose-Capillary: 199 mg/dL — ABNORMAL HIGH (ref 70–99)
Glucose-Capillary: 171 mg/dL — ABNORMAL HIGH (ref 70–99)
Glucose-Capillary: 225 mg/dL — ABNORMAL HIGH (ref 70–99)

## 2023-01-12 MED ORDER — AMLODIPINE BESYLATE 5 MG PO TABS
5.0000 mg | ORAL_TABLET | Freq: Every day | ORAL | Status: DC
  Administered 2023-01-12 – 2023-01-13 (×2): 5 mg via ORAL
  Filled 2023-01-12 (×2): qty 1

## 2023-01-12 MED ORDER — INSULIN GLARGINE-YFGN 100 UNIT/ML ~~LOC~~ SOLN
30.0000 [IU] | Freq: Every day | SUBCUTANEOUS | Status: DC
  Administered 2023-01-12: 30 [IU] via SUBCUTANEOUS
  Filled 2023-01-12 (×2): qty 0.3

## 2023-01-12 NOTE — Progress Notes (Signed)
PROGRESS NOTE    Harold [name]  WCB:[id number] DOB: 20-Oct-1955 DOA: 01/08/2023 PCP: [name], MD    Brief Narrative:   Harold [name] is a 67 y.o. male with past medical history significant for HTN, HLD, CAD s/p PCI, A-fib on Eliquis, COPD, OSA on nightly CPAP, DM2 with renal complications and peripheral neuropathy, stage IIIb CKD, PAD s/p stents, anxiety and depression, gout, tobacco abuse, orthostatic hypotension, frequent falls, recent admission 11/14 - 19 after ground level fall c/b open right ankle fracture s/p ORIF on 11/14 who presented to [facility] ED via EMS from [facility] after being found down on the ground following an apparent fall, associated with confusion.  He was noted to be hypoglycemics with glucose in the 40's. Patient reports decreased PO intake at SNF due to quality of the food there. Evening prior to admission, he ate a [place] sandwich which is less than typical, and thinks he received full dose of Humulin R 150 units with this. Had also received normal dose of Tresiba 50 units in the morning. Felt normal around 7PM. His roommate at SNF noted that he had fallen out of bed and they called RN but were unable to have someone come to room until roommate wheeled himself out of room to get help. At time of EMS arrival BG in 40s and treated with D50. Patient feels his confusion is improving with treatment with D50 and D10 here in the ED. Overall no other changes in meds or symptomatically since going to SNF. Unhappy with the care there and wanting to speak with SW about potential change in SNF or options available.   Assessment & Plan:    Hypoglycemia associated with insulin use Hx DM type 2  Acute Metabolic encephalopathy secondary to hypoglycemia Hx recurrent falls out of bed, nature of fall PTA unknown due to patients AMS around this time. Found on the ground by roommate at SNF. On EMS arrival BG in 40s, and treated with D50. Has continued hypoglycemia via  fingerstick in ED and started on D10 gtt. His mental status has improved with treatment of hypoglycemia. Home insulin regimen is Tresiba 50 units in AM, Humulin R U500 50 U with breakfast and 150 U with dinner. Reason for hypoglycemia likely decreased PO intake along with continued high insulin dosage.  Initially requiring D10 drip which was slowly titrated off. -- Diabetic educator following, appreciate assistance -- Semglee 30 units [place] daily -- Novolog 5u TIDAC -- moderate SSI for coverage -- CBGs qAC/HS -- Gabapentin reduced to 300 mg at bedtime 2/2 encephalopathy (home is 300 mg BID)    Left 1st proximal phalanx fracture, mildly displaced, initial encounter  Ground-level fall out of bed X-ray left foot with fracture base left great toe proximal flailing status mildly displaced. -- Postoperative shoe, weightbearing as tolerates -- PT/OT recommending return to SNF, TOC consulted   Recent open fracture R ankle s/p ORIF on 11/14  Open right trimalleolar ankle fracture dislocation including syndesmosis s/p I&D and ORIF of fibula, medial malleolus, posterior malleolus and syndesmosis on 11/14. -- NWB x 8 weeks post op per Ortho; plan for splint to be converted to short leg cast at 2 wk post op.  -- PT/OT eval recommend return to SNF; patient and family request change in SNF placement; TOC following -- Continue Vit D3 5000 IU daily  -- Outpatient follow-up with orthopedics   Hypokalemia Potassium 3.5, will replete. -- BMP daily    Resolving AKI stage II on CKD  stage III:  Baseline Cr 1.8 - 2. Last admission peaked at 4.7, thought to be related to hypotension, NSAID, lasix, losartan I.e. prerenal. Back to 2.1 at time of admission.  -- Cr 2.1>2.85>2.48>2.54 -- continue to hold losartan  Anemia, secondary to acute blood loss, improving:  Baseline Hb 13-14, nadir of 8.6 last admission. Back to 10 this admission.   HTN Hx orthostatic hypotension:  -- Continue home Carvedilol 3.125mg  PO  BID -- start amlodipine 5mg  PO daily -- Losartan currently held from past admit -- continue to hold home furosemide for now (on 80mg  BID)  Hyperlipidemia:  -- Continue rosuvastatin 20mg  PO daily    Hypothyroid:  Recent TSH: 3.387 -- Continue home levothyroxine 88 mcg p.o. daily  OSA  -- Continue nocturnal CPAP  Paroxysmal atrial fibrillation:  -- Continue amiodarone, Eliquis  CAD/PAD/lower extremity venous stasis ulcers:  Hx CEA, cardiac and peripheral artery stents. -- Recently taken off Plavix and started on Eliquis.   -- Continue statin -- Follows with outpatient wound care.  Chronic diastolic CHF:  TTE 02/2022: LVEF 50-55%.  -- Diuretics currently held with hypokalemia -- strict I's and O's  Anxiety and depression: Continue home Abilify and Cymbalta  Ambulatory dysfunction/frequent falls/?  Orthostatic hypotension:  -- PT/OT recommend return to SNF  Morbid obesity, class III Body mass index is 42.17 kg/m.  Patient on [facility] outpatient and efforts to lose weight.  Complicates all facets of care.    DVT prophylaxis:  apixaban (ELIQUIS) tablet 5 mg    Code Status: Full Code Family Communication: No family present bedside this morning  Disposition Plan:  Level of care: Med-Surg Status is: Inpatient Remains inpatient appropriate because: Awaiting SNF placement, per TOC    Consultants:  None  Procedures:  None  Antimicrobials:  None   Subjective: Patient seen examined bedside, resting calmly.  Lying in bed.  TOC working on alternate SNF facility.  Glucose continues uptrend, will continue titration of insulin slowly to avoid any further episodes of hypoglycemia.  No specific questions or concerns at this time.  Denies headache, no dizziness, no chest pain, no palpitations, no shortness of breath, no abdominal pain, no fever/chills/night sweats, no nausea/vomiting/diarrhea, no focal weakness, no fatigue, no paresthesias.  No acute events overnight per  nursing staff.  Objective: Vitals:   01/11/23 2016 01/12/23 0541 01/12/23 0854 01/12/23 0856  BP: (!) 165/69 (!) 178/65 (!) 134/116 (!) 163/69  Pulse: 64 64 70 70  Resp: 18 16    Temp: 98.4 F (36.9 C) 98.3 F (36.8 C) 98 F (36.7 C)   TempSrc: Oral Oral Oral   SpO2: 92% 97% 98% 100%  Weight:      Height:        Intake/Output Summary (Last 24 hours) at 01/12/2023 1127 Last data filed at 01/12/2023 1042 Gross per 24 hour  Intake 860 ml  Output 700 ml  Net 160 ml   Filed Weights   01/09/23 0024  Weight: (!) 145 kg    Examination:  Physical Exam: GEN: NAD, alert and oriented x 3, morbidly obese, chronically ill in appearance HEENT: NCAT, PERRL, EOMI, sclera clear, MMM PULM: CTAB w/o wheezes/crackles, normal respiratory effort, on room air CV: RRR w/o M/G/R GI: abd soft, NTND, NABS, no R/G/M MSK: no peripheral edema, muscle strength globally intact 5/5 bilateral upper/lower extremities NEURO: CN II-XII intact, no focal deficits, sensation to light touch intact PSYCH: normal mood/affect Integumentary: Right forearm abrasion noted with 1 cm hemorrhagic bulla in the webspace between  first and second digit right hand, right lower extremity with dressing/splint in place, left foot with ecchymosis greatest at the great toe, otherwise no other concerning rashes/lesions/wounds noted on exposed skin surfaces.      Data Reviewed: I have personally reviewed following labs and imaging studies  CBC: Recent Labs  Lab 01/06/23 0817 01/07/23 0737 01/08/23 2356 01/09/23 0004  WBC 6.0 8.0 9.4  --   NEUTROABS  --   --  7.0  --   HGB 8.8* 9.5* 10.3* 10.2*  HCT 26.0* 27.6* 30.3* 30.0*  MCV 92.2 91.1 91.8  --   PLT 159 194 220  --    Basic Metabolic Panel: Recent Labs  Lab 01/06/23 0817 01/07/23 0737 01/08/23 2356 01/09/23 0004 01/09/23 0622 01/10/23 0725 01/11/23 0528 01/12/23 0609  NA 139   < > 139 140 138 141 140 137  K 4.2   < > 3.0* 2.9* 3.8 4.0 3.5 3.9  CL 107    < > 103 100 101 106 106 104  CO2 27   < > 26  --  29 21* 26 24  GLUCOSE 205*   < > 662* 690* 120* 190* 197* 237*  BUN 48*   < > 32* 31* 31* 35* 31* 32*  CREATININE 2.96*   < > 2.18* 2.10* 2.18* 2.85* 2.48* 2.54*  CALCIUM 8.5*   < > 8.6*  --  8.6* 8.3* 8.3* 8.4*  MG  --   --   --   --  2.2 2.1  --   --   PHOS 2.7  --   --   --  3.4  --   --   --    < > = values in this interval not displayed.   GFR: Estimated Creatinine Clearance: 41.1 mL/min (A) (by C-G formula based on SCr of 2.54 mg/dL (H)). Liver Function Tests: Recent Labs  Lab 01/06/23 0817 01/08/23 2356  AST  --  29  ALT  --  16  ALKPHOS  --  68  BILITOT  --  0.7  PROT  --  5.7*  ALBUMIN 2.5* 3.0*   No results for input(s): "LIPASE", "AMYLASE" in the last 168 hours. No results for input(s): "AMMONIA" in the last 168 hours. Coagulation Profile: No results for input(s): "INR", "PROTIME" in the last 168 hours. Cardiac Enzymes: Recent Labs  Lab 01/08/23 2356  CKTOTAL 207   BNP (last 3 results) No results for input(s): "PROBNP" in the last 8760 hours. HbA1C: No results for input(s): "HGBA1C" in the last 72 hours. CBG: Recent Labs  Lab 01/11/23 0728 01/11/23 1136 01/11/23 1609 01/11/23 1950 01/12/23 0733  GLUCAP 193* 232* 209* 155* 204*   Lipid Profile: No results for input(s): "CHOL", "HDL", "LDLCALC", "TRIG", "CHOLHDL", "LDLDIRECT" in the last 72 hours. Thyroid Function Tests: No results for input(s): "TSH", "T4TOTAL", "FREET4", "T3FREE", "THYROIDAB" in the last 72 hours. Anemia Panel: No results for input(s): "VITAMINB12", "FOLATE", "FERRITIN", "TIBC", "IRON", "RETICCTPCT" in the last 72 hours. Sepsis Labs: No results for input(s): "PROCALCITON", "LATICACIDVEN" in the last 168 hours.  No results found for this or any previous visit (from the past 240 hour(s)).        Radiology Studies: No results found.      Scheduled Meds:  allopurinol  100 mg Oral Daily   amiodarone  200 mg Oral Daily    amLODipine  5 mg Oral Daily   apixaban  5 mg Oral BID   ARIPiprazole  5 mg Oral Daily  carvedilol  3.125 mg Oral BID WC   cholecalciferol  125 mcg Oral Daily   DULoxetine  60 mg Oral Daily   ezetimibe  10 mg Oral Daily   fluticasone  2 spray Each Nare Daily   gabapentin  300 mg Oral QHS   insulin aspart  0-15 Units Subcutaneous TID WC   insulin aspart  5 Units Subcutaneous TID WC   insulin glargine-yfgn  30 Units Subcutaneous Daily   levothyroxine  88 mcg Oral QAC breakfast   loratadine  10 mg Oral Daily   pantoprazole  40 mg Oral Daily   rosuvastatin  20 mg Oral QPM   sodium chloride flush  3 mL Intravenous Q12H   Continuous Infusions:     LOS: 3 days    Time spent: 56 minutes spent on chart review, discussion with nursing staff, consultants, updating family and interview/physical exam; more than 50% of that time was spent in counseling and/or coordination of care.    Alvira Philips [place], DO [facility] Available via Epic secure chat 7am-7pm After these hours, please refer to coverage provider listed on [email or web address] 01/12/2023, 11:27 AM

## 2023-01-12 NOTE — Progress Notes (Signed)
   01/12/23 2119  BiPAP/CPAP/SIPAP  BiPAP/CPAP/SIPAP Pt Type Adult  BiPAP/CPAP/SIPAP Resmed  Mask Type Full face mask  Mask Size Medium  Respiratory Rate 18 breaths/min  EPAP 5 cmH2O  FiO2 (%) 21 %  Patient Home Equipment No  BiPAP/CPAP /SiPAP Vitals  Bilateral Breath Sounds Clear;Diminished

## 2023-01-13 DIAGNOSIS — E160 Drug-induced hypoglycemia without coma: Secondary | ICD-10-CM | POA: Diagnosis not present

## 2023-01-13 DIAGNOSIS — T383X5A Adverse effect of insulin and oral hypoglycemic [antidiabetic] drugs, initial encounter: Secondary | ICD-10-CM | POA: Diagnosis not present

## 2023-01-13 LAB — GLUCOSE, CAPILLARY
Glucose-Capillary: 187 mg/dL — ABNORMAL HIGH (ref 70–99)
Glucose-Capillary: 174 mg/dL — ABNORMAL HIGH (ref 70–99)
Glucose-Capillary: 177 mg/dL — ABNORMAL HIGH (ref 70–99)
Glucose-Capillary: 190 mg/dL — ABNORMAL HIGH (ref 70–99)

## 2023-01-13 MED ORDER — INSULIN GLARGINE-YFGN 100 UNIT/ML ~~LOC~~ SOLN
35.0000 [IU] | Freq: Every day | SUBCUTANEOUS | Status: DC
  Administered 2023-01-13: 35 [IU] via SUBCUTANEOUS
  Filled 2023-01-13 (×2): qty 0.35

## 2023-01-13 NOTE — Consult Note (Signed)
[facility] Liaison Consult Note    01/13/2023  Harold [name] 03-23-1955 [id number]  Insurance:   Primary Care Provider: [name], MD with [facility], this provider is listed for the transition of care follow up appointments  and [facility] calls   RN Hospital Liaison [facility] Liaison Assessment Location:[id number]}    The patient was screened for 7 day readmission hospitalization with noted high risk score for unplanned readmission risk 2 hospital admissions in 6 months.  The patient was assessed for potential Community Care Coordination service needs for post hospital transition for care coordination. Review of patient's electronic medical record reveals patient is being recommended to return to a skilled nursing level of care for rehab..   Plan: [facility] Liaison will continue to follow progress and disposition to asess for post hospital community care coordination/management needs.  Referral request for community care coordination: ***   VBCI Community Care, Population Health does not replace or interfere with any arrangements made by the Inpatient Transition of Care team.   For questions contact:   Charlesetta Shanks, RN, BSN, CCM [place]  [facility] Liaison Direct Dial: [phone] or secure chat Email: [email or web address]

## 2023-01-13 NOTE — Care Management Important Message (Signed)
Important Message  Patient Details  Name: WRIGHT MOLLE MRN: [id number] Date of Birth: Jul 03, 1955   Important Message Given:  Yes - Medicare IM     Sherilyn Banker 01/13/2023, 3:19 PM

## 2023-01-13 NOTE — Progress Notes (Signed)
Physical Therapy Treatment Patient Details Name: Harold [name] MRN: [id number] DOB: [birth year] Today's Date: 01/13/2023   History of Present Illness Pt is a 67 y/o male who recently had an admission on 11/14 after a fall and had a R ankle ORIF with NWB. Pt now was admitted on 01/08/23 due to another fall at [facility]. He was found to have a L 1st proximal phalanx fx. Pt significant PMH: HTN, HLD, CAD s/p PCI, COPD, DM2, CKD stage IIIb, spinal fusions and lumbar laminectomies (multiple sx-2005, 2010, 2013), PVD, gout, OSA.    PT Comments  Pt received sitting EOB, agreeable to therapy session and with good participation and tolerance for transfer and seated/standing LE exercises. Pt with improved tolerance for transfers this date now that MD [place] clarified he is LLE WBAT, pt needing RLE held up for safety during lateral scoot transfer to drop arm recliner and pt performed STS from recliner>RW with up to modA and minA steadying balance during standing RLE therex. Pt quick to fatigue with DOE 2/4 with exertion. VSS on RA. Pt will continue to benefit from skilled rehab in a post acute setting <3 hours/day to maximize functional gains before returning home.     If plan is discharge home, recommend the following: Two people to help with walking and/or transfers;A lot of help with bathing/dressing/bathroom   Can travel by private vehicle     No  Equipment Recommendations  Other (comment) (Per accepting facility)    Recommendations for Other Services       Precautions / Restrictions Precautions Precautions: Fall Precaution Comments: multiple falls at SNF reported Required Braces or Orthoses: Splint/Cast Splint/Cast: hard, on RLE Splint/Cast - Date Prophylactic Dressing Applied (if applicable): 01/02/23 Restrictions Weight Bearing Restrictions: Yes RLE Weight Bearing: Non weight bearing LLE Weight Bearing: Weight bearing as tolerated Other Position/Activity Restrictions: WBAT on LLE with  post op shoe per [place], Minerva Areola DO     Mobility  Bed Mobility Overal bed mobility: Needs Assistance             General bed mobility comments: pt received sitting EOB    Transfers Overall transfer level: Needs assistance Equipment used: Rolling walker (2 wheels) Transfers: Bed to chair/wheelchair/BSC, Sit to/from Stand Sit to Stand: Mod assist, From elevated surface          Lateral/Scoot Transfers: Min assist, From elevated surface General transfer comment: Pt did better with lateral scoot transfer to drop arm chair on his L side, pt assisted to don L post-op shoe prior to transfer. Pt needs cues to avoid scooting too far forward to prevent hips coming forward off bed/chair surface; bed elevated to be slightly higher than chair for ease of transfer. Pt took ~6 min seated break to recover due to DOE 2/4 and while BP checked and water obtained for him [facility], see below) then performed STS from chair>RW with PTA holding RLE elevated off floor and up to modA lift assist, cues for pushing from chair with LUE, increased time to transfer from arm rest to RW with LUE.    Ambulation/Gait               General Gait Details: defer due to pt WB status and c/o fatigue after standing RLE therex and transfers   Stairs             Wheelchair Mobility     Tilt Bed    Modified Rankin (Stroke Patients Only)       Balance Overall balance assessment:  Needs assistance Sitting-balance support: Feet supported Sitting balance-Leahy Scale: Good Sitting balance - Comments: EOB, good static sitting unsupported, fair dynamic sitting balance   Standing balance support: Bilateral upper extremity supported, During functional activity, Reliant on assistive device for balance Standing balance-Leahy Scale: Poor Standing balance comment: minA for static standing at RW once upright while performing RLE therex                            Cognition Arousal: Alert Behavior  During Therapy: WFL for tasks assessed/performed Overall Cognitive Status: Impaired/Different from baseline Area of Impairment: Safety/judgement                         Safety/Judgement: Decreased awareness of safety     General Comments: some decreased awareness of deficits and safety, pt requires cueing to for precaution adherance and hands-on assist on RLE to prevent accidental WB through limb        Exercises General Exercises - Lower Extremity Ankle Circles/Pumps: AROM, Left, 10 reps, Seated Long Arc Quad: AROM, Both, 10 reps, Seated Other Exercises Other Exercises: standing RLE hip flexion, knee flex/ext x10 reps ea Other Exercises: static standing at RW ~60 seconds prior to standing therex for LLE strengthening Other Exercises: chair push-ups x5 reps ea    General Comments General comments (skin integrity, edema, etc.): BP 148/58 HR 63 bpm sitting in recliner after scoot transfer, prior to standing      Pertinent Vitals/Pain Pain Assessment Pain Assessment: 0-10 Pain Score: 5  Pain Location: R ankle and back with scoot transfer and STS; pt denies LLE pain Pain Descriptors / Indicators: Discomfort, Grimacing, Guarding, Sore Pain Intervention(s): Limited activity within patient's tolerance, Monitored during session, Repositioned    Home Living                          Prior Function            PT Goals (current goals can now be found in the care plan section) Acute Rehab PT Goals Patient Stated Goal: go to rehab PT Goal Formulation: With patient/family Time For Goal Achievement: 01/23/23 Progress towards PT goals: Progressing toward goals    Frequency    Min 1X/week      PT Plan      Co-evaluation              AM-PAC PT "6 Clicks" Mobility   Outcome Measure  Help needed turning from your back to your side while in a flat bed without using bedrails?: A Little Help needed moving from lying on your back to sitting on the side  of a flat bed without using bedrails?: A Little (w/o rail and flat bed) Help needed moving to and from a bed to a chair (including a wheelchair)?: A Lot Help needed standing up from a chair using your arms (e.g., wheelchair or bedside chair)?: A Lot Help needed to walk in hospital room?: Total Help needed climbing 3-5 steps with a railing? : Total 6 Click Score: 12    End of Session Equipment Utilized During Treatment: Other (comment);Gait belt (LLE Post-op shoe) Activity Tolerance: Patient tolerated treatment well;Patient limited by pain;Patient limited by fatigue Patient left: with call bell/phone within reach;with family/visitor present;in chair;Other (comment);with chair alarm set (spouse [name] present) Nurse Communication: Mobility status;Other (comment) (lateral scoot from drop arm chair) PT Visit Diagnosis: Other abnormalities of gait and  mobility (R26.89)     Time: 1448-1510 PT Time Calculation (min) (ACUTE ONLY): 22 min  Charges:    $Therapeutic Exercise: 8-22 mins PT General Charges $$ ACUTE PT VISIT: 1 Visit                     Salomon Ganser P., PTA Acute Rehabilitation Services Secure Chat Preferred 9a-5:30pm Office: [phone]    Dorathy Kinsman [facility] 01/13/2023, 3:50 PM

## 2023-01-13 NOTE — TOC Progression Note (Addendum)
Transition of Care [facility]) - Progression Note    Patient Details  Name: Harold [name] MRN: [id number] Date of Birth: 26-Oct-1955  Transition of Care [facility]) CM/SW Contact  Lorri Frederick, LCSW Phone Number: 01/13/2023, 2:48 PM  Clinical Narrative:    CSW spoke with pt regarding SNF choice.  Husband is visiting [place] now.  CSW called husband [name], he has meeting at 1430.  Will call back afterwards.  1445: Jodelle Red, just completed tour, ready to move forward with [facility].  1545: Auth request submitted in [place].  CSW confirmed with [facility] they can receive pt tomorrow.   Expected Discharge Plan: Skilled Nursing Facility Barriers to Discharge: Continued Medical Work up, SNF Pending bed offer  Expected Discharge Plan and Services In-house Referral: Clinical Social Work   Post Acute Care Choice: Skilled Nursing Facility Living arrangements for the past 2 months: Single Family Home                                       Social Determinants of Health (SDOH) Interventions SDOH Screenings   Food Insecurity: No Food Insecurity (01/09/2023)  Housing: Low Risk  (01/09/2023)  Transportation Needs: No Transportation Needs (01/09/2023)  Utilities: Not At Risk (01/09/2023)  Tobacco Use: Medium Risk (01/10/2023)    Readmission Risk Interventions     No data to display

## 2023-01-13 NOTE — Progress Notes (Signed)
PROGRESS NOTE    Harold [name]  XBM:[id number] DOB: 1956/01/24 DOA: 01/08/2023 PCP: [name], MD    Brief Narrative:   Harold [name] is a 67 y.o. male with past medical history significant for HTN, HLD, CAD s/p PCI, A-fib on Eliquis, COPD, OSA on nightly CPAP, DM2 with renal complications and peripheral neuropathy, stage IIIb CKD, PAD s/p stents, anxiety and depression, gout, tobacco abuse, orthostatic hypotension, frequent falls, recent admission 11/14 - 19 after ground level fall c/b open right ankle fracture s/p ORIF on 11/14 who presented to [facility] ED via EMS from [facility] after being found down on the ground following an apparent fall, associated with confusion.  He was noted to be hypoglycemics with glucose in the 40's. Patient reports decreased PO intake at SNF due to quality of the food there. Evening prior to admission, he ate a [place] sandwich which is less than typical, and thinks he received full dose of Humulin R 150 units with this. Had also received normal dose of Tresiba 50 units in the morning. Felt normal around 7PM. His roommate at SNF noted that he had fallen out of bed and they called RN but were unable to have someone come to room until roommate wheeled himself out of room to get help. At time of EMS arrival BG in 40s and treated with D50. Patient feels his confusion is improving with treatment with D50 and D10 here in the ED. Overall no other changes in meds or symptomatically since going to SNF. Unhappy with the care there and wanting to speak with SW about potential change in SNF or options available.   Assessment & Plan:    Hypoglycemia associated with insulin use Hx DM type 2  Acute Metabolic encephalopathy secondary to hypoglycemia Hx recurrent falls out of bed, nature of fall PTA unknown due to patients AMS around this time. Found on the ground by roommate at SNF. On EMS arrival BG in 40s, and treated with D50. Has continued hypoglycemia via  fingerstick in ED and started on D10 gtt. His mental status has improved with treatment of hypoglycemia. Home insulin regimen is Tresiba 50 units in AM, Humulin R U500 50 U with breakfast and 150 U with dinner. Reason for hypoglycemia likely decreased PO intake along with continued high insulin dosage.  Initially requiring D10 drip which was slowly titrated off. -- Diabetic educator following, appreciate assistance -- Semglee 35 units  daily -- Novolog 5u TIDAC -- moderate SSI for coverage -- CBGs qAC/HS -- Gabapentin reduced to 300 mg at bedtime 2/2 encephalopathy (home is 300 mg BID)    Left 1st proximal phalanx fracture, mildly displaced, initial encounter  Ground-level fall out of bed X-ray left foot with fracture base left great toe proximal flailing status mildly displaced. -- Postoperative shoe, weightbearing as tolerates -- PT/OT recommending return to SNF, TOC consulted   Recent open fracture R ankle s/p ORIF on 11/14  Open right trimalleolar ankle fracture dislocation including syndesmosis s/p I&D and ORIF of fibula, medial malleolus, posterior malleolus and syndesmosis on 11/14. -- NWB x 8 weeks post op per Ortho; plan for splint to be converted to short leg cast at 2 wk post op.  -- PT/OT eval recommend return to SNF; patient and family request change in SNF placement; TOC following -- Continue Vit D3 5000 IU daily  -- Outpatient follow-up with orthopedics   Hypokalemia Potassium 3.5, will replete. -- BMP daily    Resolving AKI stage II on CKD  stage III:  Baseline Cr 1.8 - 2. Last admission peaked at 4.7, thought to be related to hypotension, NSAID, lasix, losartan I.e. prerenal. Back to 2.1 at time of admission.  -- Cr 2.1>2.85>2.48>2.54 -- continue to hold losartan  Anemia, secondary to acute blood loss, improving:  Baseline Hb 13-14, nadir of 8.6 last admission. Back to 10 this admission.   HTN Hx orthostatic hypotension:  -- Continue home Carvedilol 3.125mg  PO  BID -- start amlodipine 5mg  PO daily -- Losartan currently held from past admit -- continue to hold home furosemide for now (on 80mg  BID)  Hyperlipidemia:  -- Continue rosuvastatin 20mg  PO daily    Hypothyroid:  Recent TSH: 3.387 -- Continue home levothyroxine 88 mcg p.o. daily  OSA  -- Continue nocturnal CPAP  Paroxysmal atrial fibrillation:  -- Continue amiodarone, Eliquis  CAD/PAD/lower extremity venous stasis ulcers:  Hx CEA, cardiac and peripheral artery stents. -- Recently taken off Plavix and started on Eliquis.   -- Continue statin -- Follows with outpatient wound care.  Chronic diastolic CHF:  TTE 02/2022: LVEF 50-55%.  -- Diuretics currently held with hypokalemia -- strict I's and O's  Anxiety and depression: Continue home Abilify and Cymbalta  Ambulatory dysfunction/frequent falls/?  Orthostatic hypotension:  -- PT/OT recommend return to SNF  Morbid obesity, class III Body mass index is 42.17 kg/m.  Patient on [facility] outpatient and efforts to lose weight.  Complicates all facets of care.    DVT prophylaxis:  apixaban (ELIQUIS) tablet 5 mg    Code Status: Full Code Family Communication: No family present bedside this morning  Disposition Plan:  Level of care: Med-Surg Status is: Inpatient Remains inpatient appropriate because: Awaiting SNF placement, per TOC    Consultants:  None  Procedures:  None  Antimicrobials:  None   Subjective: Patient seen examined bedside, resting calmly.  Lying in bed.  TOC working on alternate SNF facility.  Glucose continues uptrend, will continue titration of insulin slowly to avoid any further episodes of hypoglycemia.  No specific questions or concerns at this time.  Denies headache, no dizziness, no chest pain, no palpitations, no shortness of breath, no abdominal pain, no fever/chills/night sweats, no nausea/vomiting/diarrhea, no focal weakness, no fatigue, no paresthesias.  No acute events overnight per  nursing staff.  Objective: Vitals:   01/12/23 2213 01/13/23 0149 01/13/23 0427 01/13/23 0742  BP: 99/75 (!) 161/72 (!) 170/74 (!) 151/68  Pulse: 64 64 (!) 59 60  Resp: 18 18 19 18   Temp: 98.7 F (37.1 C) 98.9 F (37.2 C)  98.6 F (37 C)  TempSrc:  Oral  Oral  SpO2: 100% 97%  98%  Weight:      Height:        Intake/Output Summary (Last 24 hours) at 01/13/2023 1230 Last data filed at 01/13/2023 0900 Gross per 24 hour  Intake 240 ml  Output 1500 ml  Net -1260 ml   Filed Weights   01/09/23 0024  Weight: (!) 145 kg    Examination:  Physical Exam: GEN: NAD, alert and oriented x 3, morbidly obese, chronically ill in appearance HEENT: NCAT, PERRL, EOMI, sclera clear, MMM PULM: CTAB w/o wheezes/crackles, normal respiratory effort, on room air CV: RRR w/o M/G/R GI: abd soft, NTND, NABS, no R/G/M MSK: no peripheral edema, muscle strength globally intact 5/5 bilateral upper/lower extremities NEURO: CN II-XII intact, no focal deficits, sensation to light touch intact PSYCH: normal mood/affect Integumentary: Right forearm abrasion noted with 1 cm hemorrhagic bulla in the webspace between  first and second digit right hand, right lower extremity with dressing/splint in place, left foot with ecchymosis greatest at the great toe, otherwise no other concerning rashes/lesions/wounds noted on exposed skin surfaces.      Data Reviewed: I have personally reviewed following labs and imaging studies  CBC: Recent Labs  Lab 01/07/23 0737 01/08/23 2356 01/09/23 0004  WBC 8.0 9.4  --   NEUTROABS  --  7.0  --   HGB 9.5* 10.3* 10.2*  HCT 27.6* 30.3* 30.0*  MCV 91.1 91.8  --   PLT 194 220  --    Basic Metabolic Panel: Recent Labs  Lab 01/08/23 2356 01/09/23 0004 01/09/23 0622 01/10/23 0725 01/11/23 0528 01/12/23 0609  NA 139 140 138 141 140 137  K 3.0* 2.9* 3.8 4.0 3.5 3.9  CL 103 100 101 106 106 104  CO2 26  --  29 21* 26 24  GLUCOSE 662* 690* 120* 190* 197* 237*  BUN 32*  31* 31* 35* 31* 32*  CREATININE 2.18* 2.10* 2.18* 2.85* 2.48* 2.54*  CALCIUM 8.6*  --  8.6* 8.3* 8.3* 8.4*  MG  --   --  2.2 2.1  --   --   PHOS  --   --  3.4  --   --   --    GFR: Estimated Creatinine Clearance: 41.1 mL/min (A) (by C-G formula based on SCr of 2.54 mg/dL (H)). Liver Function Tests: Recent Labs  Lab 01/08/23 2356  AST 29  ALT 16  ALKPHOS 68  BILITOT 0.7  PROT 5.7*  ALBUMIN 3.0*   No results for input(s): "LIPASE", "AMYLASE" in the last 168 hours. No results for input(s): "AMMONIA" in the last 168 hours. Coagulation Profile: No results for input(s): "INR", "PROTIME" in the last 168 hours. Cardiac Enzymes: Recent Labs  Lab 01/08/23 2356  CKTOTAL 207   BNP (last 3 results) No results for input(s): "PROBNP" in the last 8760 hours. HbA1C: No results for input(s): "HGBA1C" in the last 72 hours. CBG: Recent Labs  Lab 01/12/23 1128 01/12/23 1609 01/12/23 2208 01/13/23 0740 01/13/23 1136  GLUCAP 199* 171* 225* 187* 174*   Lipid Profile: No results for input(s): "CHOL", "HDL", "LDLCALC", "TRIG", "CHOLHDL", "LDLDIRECT" in the last 72 hours. Thyroid Function Tests: No results for input(s): "TSH", "T4TOTAL", "FREET4", "T3FREE", "THYROIDAB" in the last 72 hours. Anemia Panel: No results for input(s): "VITAMINB12", "FOLATE", "FERRITIN", "TIBC", "IRON", "RETICCTPCT" in the last 72 hours. Sepsis Labs: No results for input(s): "PROCALCITON", "LATICACIDVEN" in the last 168 hours.  No results found for this or any previous visit (from the past 240 hour(s)).        Radiology Studies: No results found.      Scheduled Meds:  allopurinol  100 mg Oral Daily   amiodarone  200 mg Oral Daily   amLODipine  5 mg Oral Daily   apixaban  5 mg Oral BID   ARIPiprazole  5 mg Oral Daily   carvedilol  3.125 mg Oral BID WC   cholecalciferol  125 mcg Oral Daily   DULoxetine  60 mg Oral Daily   ezetimibe  10 mg Oral Daily   fluticasone  2 spray Each Nare Daily    gabapentin  300 mg Oral QHS   insulin aspart  0-15 Units Subcutaneous TID WC   insulin aspart  5 Units Subcutaneous TID WC   insulin glargine-yfgn  35 Units Subcutaneous Daily   levothyroxine  88 mcg Oral QAC breakfast   loratadine  10 mg  Oral Daily   pantoprazole  40 mg Oral Daily   rosuvastatin  20 mg Oral QPM   sodium chloride flush  3 mL Intravenous Q12H   Continuous Infusions:     LOS: 4 days    Time spent: 56 minutes spent on chart review, discussion with nursing staff, consultants, updating family and interview/physical exam; more than 50% of that time was spent in counseling and/or coordination of care.    Alvira Philips [place], DO [facility] Available via Epic secure chat 7am-7pm After these hours, please refer to coverage provider listed on [email or web address] 01/13/2023, 12:30 PM

## 2023-01-14 DIAGNOSIS — S82851E Displaced trimalleolar fracture of right lower leg, subsequent encounter for open fracture type I or II with routine healing: Secondary | ICD-10-CM | POA: Diagnosis not present

## 2023-01-14 DIAGNOSIS — T8484XD Pain due to internal orthopedic prosthetic devices, implants and grafts, subsequent encounter: Secondary | ICD-10-CM | POA: Diagnosis not present

## 2023-01-14 DIAGNOSIS — I129 Hypertensive chronic kidney disease with stage 1 through stage 4 chronic kidney disease, or unspecified chronic kidney disease: Secondary | ICD-10-CM | POA: Diagnosis not present

## 2023-01-14 DIAGNOSIS — I480 Paroxysmal atrial fibrillation: Secondary | ICD-10-CM | POA: Diagnosis not present

## 2023-01-14 DIAGNOSIS — E119 Type 2 diabetes mellitus without complications: Secondary | ICD-10-CM | POA: Diagnosis not present

## 2023-01-14 DIAGNOSIS — S93431A Sprain of tibiofibular ligament of right ankle, initial encounter: Secondary | ICD-10-CM | POA: Diagnosis not present

## 2023-01-14 DIAGNOSIS — E876 Hypokalemia: Secondary | ICD-10-CM | POA: Diagnosis not present

## 2023-01-14 DIAGNOSIS — T84418A Breakdown (mechanical) of other internal orthopedic devices, implants and grafts, initial encounter: Secondary | ICD-10-CM | POA: Diagnosis not present

## 2023-01-14 DIAGNOSIS — I2510 Atherosclerotic heart disease of native coronary artery without angina pectoris: Secondary | ICD-10-CM | POA: Diagnosis not present

## 2023-01-14 DIAGNOSIS — S9304XD Dislocation of right ankle joint, subsequent encounter: Secondary | ICD-10-CM | POA: Diagnosis not present

## 2023-01-14 DIAGNOSIS — Z4789 Encounter for other orthopedic aftercare: Secondary | ICD-10-CM | POA: Diagnosis not present

## 2023-01-14 DIAGNOSIS — I252 Old myocardial infarction: Secondary | ICD-10-CM | POA: Diagnosis not present

## 2023-01-14 DIAGNOSIS — Y831 Surgical operation with implant of artificial internal device as the cause of abnormal reaction of the patient, or of later complication, without mention of misadventure at the time of the procedure: Secondary | ICD-10-CM | POA: Diagnosis not present

## 2023-01-14 DIAGNOSIS — S93431D Sprain of tibiofibular ligament of right ankle, subsequent encounter: Secondary | ICD-10-CM | POA: Diagnosis not present

## 2023-01-14 DIAGNOSIS — G4733 Obstructive sleep apnea (adult) (pediatric): Secondary | ICD-10-CM | POA: Diagnosis not present

## 2023-01-14 DIAGNOSIS — E162 Hypoglycemia, unspecified: Secondary | ICD-10-CM | POA: Diagnosis not present

## 2023-01-14 DIAGNOSIS — T84116A Breakdown (mechanical) of internal fixation device of bone of right lower leg, initial encounter: Secondary | ICD-10-CM | POA: Diagnosis not present

## 2023-01-14 DIAGNOSIS — J449 Chronic obstructive pulmonary disease, unspecified: Secondary | ICD-10-CM | POA: Diagnosis not present

## 2023-01-14 DIAGNOSIS — S82401D Unspecified fracture of shaft of right fibula, subsequent encounter for closed fracture with routine healing: Secondary | ICD-10-CM | POA: Diagnosis not present

## 2023-01-14 DIAGNOSIS — S82301D Unspecified fracture of lower end of right tibia, subsequent encounter for closed fracture with routine healing: Secondary | ICD-10-CM | POA: Diagnosis not present

## 2023-01-14 DIAGNOSIS — T383X5A Adverse effect of insulin and oral hypoglycemic [antidiabetic] drugs, initial encounter: Secondary | ICD-10-CM | POA: Diagnosis not present

## 2023-01-14 DIAGNOSIS — I272 Pulmonary hypertension, unspecified: Secondary | ICD-10-CM | POA: Diagnosis not present

## 2023-01-14 DIAGNOSIS — R2689 Other abnormalities of gait and mobility: Secondary | ICD-10-CM | POA: Diagnosis not present

## 2023-01-14 DIAGNOSIS — D72829 Elevated white blood cell count, unspecified: Secondary | ICD-10-CM | POA: Diagnosis not present

## 2023-01-14 DIAGNOSIS — E1165 Type 2 diabetes mellitus with hyperglycemia: Secondary | ICD-10-CM | POA: Diagnosis not present

## 2023-01-14 DIAGNOSIS — G9341 Metabolic encephalopathy: Secondary | ICD-10-CM | POA: Diagnosis not present

## 2023-01-14 DIAGNOSIS — E039 Hypothyroidism, unspecified: Secondary | ICD-10-CM | POA: Diagnosis not present

## 2023-01-14 DIAGNOSIS — R296 Repeated falls: Secondary | ICD-10-CM | POA: Diagnosis not present

## 2023-01-14 DIAGNOSIS — D649 Anemia, unspecified: Secondary | ICD-10-CM | POA: Diagnosis not present

## 2023-01-14 DIAGNOSIS — Z955 Presence of coronary angioplasty implant and graft: Secondary | ICD-10-CM | POA: Diagnosis not present

## 2023-01-14 DIAGNOSIS — E785 Hyperlipidemia, unspecified: Secondary | ICD-10-CM | POA: Diagnosis not present

## 2023-01-14 DIAGNOSIS — Z87891 Personal history of nicotine dependence: Secondary | ICD-10-CM | POA: Diagnosis not present

## 2023-01-14 DIAGNOSIS — Z7401 Bed confinement status: Secondary | ICD-10-CM | POA: Diagnosis not present

## 2023-01-14 DIAGNOSIS — Z7901 Long term (current) use of anticoagulants: Secondary | ICD-10-CM | POA: Diagnosis not present

## 2023-01-14 DIAGNOSIS — S82851B Displaced trimalleolar fracture of right lower leg, initial encounter for open fracture type I or II: Secondary | ICD-10-CM | POA: Diagnosis not present

## 2023-01-14 DIAGNOSIS — S92912D Unspecified fracture of left toe(s), subsequent encounter for fracture with routine healing: Secondary | ICD-10-CM | POA: Diagnosis not present

## 2023-01-14 DIAGNOSIS — N1832 Chronic kidney disease, stage 3b: Secondary | ICD-10-CM | POA: Diagnosis not present

## 2023-01-14 DIAGNOSIS — K219 Gastro-esophageal reflux disease without esophagitis: Secondary | ICD-10-CM | POA: Diagnosis not present

## 2023-01-14 DIAGNOSIS — E160 Drug-induced hypoglycemia without coma: Secondary | ICD-10-CM | POA: Diagnosis not present

## 2023-01-14 DIAGNOSIS — R278 Other lack of coordination: Secondary | ICD-10-CM | POA: Diagnosis not present

## 2023-01-14 DIAGNOSIS — E161 Other hypoglycemia: Secondary | ICD-10-CM | POA: Diagnosis not present

## 2023-01-14 DIAGNOSIS — I4891 Unspecified atrial fibrillation: Secondary | ICD-10-CM | POA: Diagnosis not present

## 2023-01-14 DIAGNOSIS — T84018A Broken internal joint prosthesis, other site, initial encounter: Secondary | ICD-10-CM | POA: Diagnosis not present

## 2023-01-14 DIAGNOSIS — S9304XA Dislocation of right ankle joint, initial encounter: Secondary | ICD-10-CM | POA: Diagnosis not present

## 2023-01-14 DIAGNOSIS — M6281 Muscle weakness (generalized): Secondary | ICD-10-CM | POA: Diagnosis not present

## 2023-01-14 DIAGNOSIS — E1122 Type 2 diabetes mellitus with diabetic chronic kidney disease: Secondary | ICD-10-CM | POA: Diagnosis not present

## 2023-01-14 DIAGNOSIS — S82891D Other fracture of right lower leg, subsequent encounter for closed fracture with routine healing: Secondary | ICD-10-CM | POA: Diagnosis not present

## 2023-01-14 DIAGNOSIS — E559 Vitamin D deficiency, unspecified: Secondary | ICD-10-CM | POA: Diagnosis not present

## 2023-01-14 LAB — GLUCOSE, CAPILLARY: Glucose-Capillary: 149 mg/dL — ABNORMAL HIGH (ref 70–99)

## 2023-01-14 MED ORDER — OXYCODONE HCL 5 MG PO TABS: 5.0000 mg | ORAL_TABLET | ORAL | 0 refills | Status: DC | PRN

## 2023-01-14 MED ORDER — INSULIN GLARGINE-YFGN 100 UNIT/ML ~~LOC~~ SOLN: 40.0000 [IU] | Freq: Every day | SUBCUTANEOUS | Status: DC

## 2023-01-14 MED ORDER — LORATADINE 10 MG PO TABS: 10.0000 mg | ORAL_TABLET | Freq: Every day | ORAL | Status: DC

## 2023-01-14 MED ORDER — INSULIN ASPART 100 UNIT/ML IJ SOLN: 8.0000 [IU] | Freq: Three times a day (TID) | INTRAMUSCULAR | Status: DC

## 2023-01-14 MED ORDER — FLUTICASONE PROPIONATE 50 MCG/ACT NA SUSP: 2.0000 | Freq: Every day | NASAL | Status: DC

## 2023-01-14 MED ORDER — INSULIN GLARGINE-YFGN 100 UNIT/ML ~~LOC~~ SOLN
40.0000 [IU] | Freq: Every day | SUBCUTANEOUS | Status: DC
  Administered 2023-01-14: 40 [IU] via SUBCUTANEOUS
  Filled 2023-01-14: qty 0.4

## 2023-01-14 MED ORDER — AMLODIPINE BESYLATE 10 MG PO TABS: 10.0000 mg | ORAL_TABLET | Freq: Every day | ORAL | Status: DC

## 2023-01-14 MED ORDER — FUROSEMIDE 80 MG PO TABS: 40.0000 mg | ORAL_TABLET | Freq: Every day | ORAL | Status: DC

## 2023-01-14 MED ORDER — INSULIN ASPART 100 UNIT/ML IJ SOLN
8.0000 [IU] | Freq: Three times a day (TID) | INTRAMUSCULAR | Status: DC
  Administered 2023-01-14: 8 [IU] via SUBCUTANEOUS

## 2023-01-14 MED ORDER — AMLODIPINE BESYLATE 10 MG PO TABS
10.0000 mg | ORAL_TABLET | Freq: Every day | ORAL | Status: DC
  Administered 2023-01-14: 10 mg via ORAL
  Filled 2023-01-14: qty 1

## 2023-01-14 NOTE — Progress Notes (Signed)
Occupational Therapy Treatment Patient Details Name: Harold [name] MRN: [id number] DOB: 1955/08/12 Today's Date: 01/14/2023   History of present illness Pt is a 67 y/o male who recently had an admission on 11/14 after a fall and had a R ankle ORIF with NWB. Pt now was admitted on 01/08/23 due to another fall at [facility]. He was found to have a L 1st proximal phalanx fx. Pt significant PMH: HTN, HLD, CAD s/p PCI, COPD, DM2, CKD stage IIIb, spinal fusions and lumbar laminectomies (multiple sx-2005, 2010, 2013), PVD, gout, OSA.   OT comments  Patient seated on EOB upon entry. Patient able to perform grooming and UB bathing and dressing seated on EOB with setup. Patient stood from EOB to RW with mod assist and max assist to doff underwear and for peri area bathing back. Patient perform front peri area and upper legs seated and stood again for pulling up shorts with cues to maintain RLE NWB. Transfer to recliner performed with lateral scoot transfer to left with assistance to hold RLE to prevent WB. Patient will benefit from continued inpatient follow up therapy, <3 hours/day for further OT treatment to address bathing, dressing, and toilet transfers. Acute OT to continue to follow.       If plan is discharge home, recommend the following:  Two people to help with walking and/or transfers;Two people to help with bathing/dressing/bathroom;Assistance with cooking/housework;Assist for transportation;Help with stairs or ramp for entrance   Equipment Recommendations  Other (comment) (defer to next venue of care)    Recommendations for Other Services      Precautions / Restrictions Precautions Precautions: Fall Precaution Comments: multiple falls at SNF reported Required Braces or Orthoses: Splint/Cast Splint/Cast: hard, on RLE Splint/Cast - Date Prophylactic Dressing Applied (if applicable): 01/02/23 Restrictions Weight Bearing Restrictions: Yes RLE Weight Bearing: Non weight bearing LLE Weight  Bearing: Weight bearing as tolerated Other Position/Activity Restrictions: WBAT on LLE with post op shoe per [place], Minerva Areola DO       Mobility Bed Mobility Overal bed mobility: Needs Assistance             General bed mobility comments: seated on EOB upon entry    Transfers Overall transfer level: Needs assistance Equipment used: Rolling walker (2 wheels) Transfers: Bed to chair/wheelchair/BSC, Sit to/from Stand Sit to Stand: Mod assist, From elevated surface          Lateral/Scoot Transfers: Min assist, From elevated surface General transfer comment: stood from raised bed to perform peri area bathing and donning/doffing underwear, lateral scoot transfer to recliner towards left     Balance Overall balance assessment: Needs assistance Sitting-balance support: Feet supported Sitting balance-Leahy Scale: Good Sitting balance - Comments: able to perform self care tasks seated on EOB   Standing balance support: Bilateral upper extremity supported, During functional activity, Reliant on assistive device for balance Standing balance-Leahy Scale: Poor Standing balance comment: reliant on BUE support to maintain WB precautions on RLE                           ADL either performed or assessed with clinical judgement   ADL Overall ADL's : Needs assistance/impaired     Grooming: Set up;Sitting;Wash/dry hands;Wash/dry face;Oral care;Applying deodorant   Upper Body Bathing: Set up;Sitting Upper Body Bathing Details (indicate cue type and reason): on EOB Lower Body Bathing: Moderate assistance;Sit to/from stand Lower Body Bathing Details (indicate cue type and reason): patient able to bathe front peri area and  upper legs seated on EOB and stood with RW with max assist for peri area back Upper Body Dressing : Set up;Sitting   Lower Body Dressing: Maximal assistance;Sit to/from stand Lower Body Dressing Details (indicate cue type and reason): mod assist to thread legs  into clothing and max assist to pull up while standing               General ADL Comments: reliant on BUE support when standing to maintain WB precautions    Extremity/Trunk Assessment              Vision       Perception     Praxis      Cognition Arousal: Alert Behavior During Therapy: WFL for tasks assessed/performed Overall Cognitive Status: Impaired/Different from baseline Area of Impairment: Safety/judgement                         Safety/Judgement: Decreased awareness of safety     General Comments: cues to adhere to WB precautions        Exercises      Shoulder Instructions       General Comments      Pertinent Vitals/ Pain       Pain Assessment Pain Assessment: Faces Faces Pain Scale: Hurts even more Pain Location: Right ankle with scoot transfer and sit to stands Pain Descriptors / Indicators: Discomfort, Grimacing, Guarding, Sore Pain Intervention(s): Limited activity within patient's tolerance, Monitored during session, Premedicated before session, Repositioned  Home Living                                          Prior Functioning/Environment              Frequency  Min 1X/week        Progress Toward Goals  OT Goals(current goals can now be found in the care plan section)  Progress towards OT goals: Progressing toward goals  Acute Rehab OT Goals Patient Stated Goal: get more rehab OT Goal Formulation: With patient Time For Goal Achievement: 01/17/23 Potential to Achieve Goals: Fair ADL Goals Pt Will Perform Grooming: with modified independence;sitting Pt Will Perform Upper Body Bathing: Independently;sitting Pt Will Perform Lower Body Bathing: with mod assist;with adaptive equipment;sitting/lateral leans Pt Will Perform Lower Body Dressing: with min assist;sitting/lateral leans;with adaptive equipment Pt Will Transfer to Toilet: with max assist;with transfer board Pt Will Perform Toileting  - Clothing Manipulation and hygiene: with min assist;sitting/lateral leans Pt/caregiver will Perform Home Exercise Program: Increased strength;With written HEP provided;With theraband Additional ADL Goal #1: Pt will complete bed mobility with independence.  Plan      Co-evaluation                 AM-PAC OT "6 Clicks" Daily Activity     Outcome Measure   Help from another person eating meals?: None Help from another person taking care of personal grooming?: A Little Help from another person toileting, which includes using toliet, bedpan, or urinal?: A Lot Help from another person bathing (including washing, rinsing, drying)?: A Lot Help from another person to put on and taking off regular upper body clothing?: A Little Help from another person to put on and taking off regular lower body clothing?: A Lot 6 Click Score: 16    End of Session Equipment Utilized During Treatment: Gait belt;Rolling walker (2 wheels)  OT Visit Diagnosis: Other abnormalities of gait and mobility (R26.89);History of falling (Z91.81);Pain;Muscle weakness (generalized) (M62.81) Pain - Right/Left: Right Pain - part of body: Ankle and joints of foot   Activity Tolerance Patient tolerated treatment well   Patient Left in chair;with call bell/phone within reach;with chair alarm set   Nurse Communication Mobility status        Time: 1610-9604 OT Time Calculation (min): 21 min  Charges: OT General Charges $OT Visit: 1 Visit OT Treatments $Self Care/Home Management : 8-22 mins  Alfonse Flavors, OTA Acute Rehabilitation Services  Office [phone]   Dewain Penning 01/14/2023, 10:57 AM

## 2023-01-14 NOTE — Progress Notes (Signed)
Tried to call for patient report to [facility]; no answer; [facility] is here to transport pt as of this writing.

## 2023-01-14 NOTE — Plan of Care (Signed)
  Problem: Education: Goal: Ability to describe self-care measures that may prevent or decrease complications (Diabetes Survival Skills Education) will improve Outcome: Progressing   Problem: Coping: Goal: Ability to adjust to condition or change in health will improve Outcome: Completed/Met   Problem: Fluid Volume: Goal: Ability to maintain a balanced intake and output will improve Outcome: Completed/Met   Problem: Health Behavior/Discharge Planning: Goal: Ability to identify and utilize available resources and services will improve Outcome: Progressing   Problem: Metabolic: Goal: Ability to maintain appropriate glucose levels will improve Outcome: Progressing   Problem: Nutritional: Goal: Maintenance of adequate nutrition will improve Outcome: Completed/Met   Problem: Skin Integrity: Goal: Risk for impaired skin integrity will decrease Outcome: Progressing   Problem: Tissue Perfusion: Goal: Adequacy of tissue perfusion will improve Outcome: Progressing   Problem: Health Behavior/Discharge Planning: Goal: Ability to manage health-related needs will improve Outcome: Progressing   Problem: Clinical Measurements: Goal: Ability to maintain clinical measurements within normal limits will improve Outcome: Progressing Goal: Will remain free from infection Outcome: Progressing Goal: Diagnostic test results will improve Outcome: Progressing Goal: Respiratory complications will improve Outcome: Progressing Goal: Cardiovascular complication will be avoided Outcome: Progressing

## 2023-01-14 NOTE — TOC Transition Note (Signed)
Transition of Care [facility]) - CM/SW Discharge Note   Patient Details  Name: Harold [name] MRN: [id number] Date of Birth: [birth year]  [facility] CM/SW Contact:  Lorri Frederick, LCSW Phone Number: 01/14/2023, 10:15 AM   Clinical Narrative:   Pt discharging to [facility], room 506.  RN call report to [phone].     Final next level of care: Skilled Nursing Facility Barriers to Discharge: Barriers Resolved   Patient Goals and CMS Choice CMS Medicare.gov Compare Post Acute Care list provided to:: Patient Choice offered to / list presented to : Patient  Discharge Placement                Patient chooses bed at: [facility] Patient to be transferred to facility by: [facility] Name of family member notified: [name] Patient and family notified of of transfer: 01/14/23  Discharge Plan and Services Additional resources added to the After Visit Summary for   In-house Referral: Clinical Social Work   Post Acute Care Choice: Skilled Nursing Facility                               Social Determinants of Health (SDOH) Interventions SDOH Screenings   Food Insecurity: No Food Insecurity (01/09/2023)  Housing: Low Risk  (01/09/2023)  Transportation Needs: No Transportation Needs (01/09/2023)  Utilities: Not At Risk (01/09/2023)  Tobacco Use: Medium Risk (01/10/2023)     Readmission Risk Interventions     No data to display

## 2023-01-14 NOTE — Discharge Summary (Signed)
Physician Discharge Summary  VERMONT REBECK WRU:[id number] DOB: 1955/09/03 DOA: 01/08/2023  PCP: [name], MD  Admit date: 01/08/2023 Discharge date: 01/14/2023  Admitted From: SNF Disposition: SNF  Recommendations for Outpatient Follow-up:  Follow up with PCP in 1-2 weeks Follow-up with orthopedics, Dr. Carola Frost Continue nonweightbearing right lower extremity Weightbearing as tolerates left lower extremity with postoperative shoe Continue to monitor glucose closely, uptitrate insulin as needed but cautious use as required recurrent hospitalization for hypoglycemia Losartan discontinued due to worsening renal function Furosemide decreased to 40 mg p.o. daily Started on amlodipine 10 mg p.o. daily Recommend repeat BMP 1 week to reassess potassium and creatinine   Discharge Condition: None CODE STATUS: Full code Diet recommendation: Heart healthy/consistent carbohydrate diet  History of present illness:  Harold [name] is a 67 y.o. 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  He was noted to be hypoglycemics with glucose in the 40's. Patient reports decreased PO intake at SNF due to quality of the food there. Evening prior to admission, he ate a [place] sandwich which is less than typical, and thinks he received full dose of Humulin R 150 units with this. Had also received normal dose of Tresiba 50 units in the morning. Felt normal around 7PM. His roommate at SNF noted that he had fallen out of  bed and they called RN but were unable to have someone come to room until roommate wheeled himself out of room to get help. At time of EMS arrival BG in 40s and treated with D50. Patient feels his confusion is improving with treatment with D50 and D10 here in the ED. Overall no other changes in meds or symptomatically since going to SNF. Unhappy with the care there and wanting to speak with SW about potential change in SNF or options available.   Hospital course:  Hypoglycemia associated with insulin use Hx DM type 2  Acute Metabolic encephalopathy secondary to hypoglycemia Hx recurrent falls out of bed, nature of fall PTA unknown due to patients AMS around this time. Found on the ground by roommate at SNF. On EMS arrival BG in 40s, and treated with D50. Has continued hypoglycemia via fingerstick in ED and started on D10 gtt. His mental status has improved with treatment of hypoglycemia. Home insulin regimen is Tresiba 50 units in AM, Humulin R U500 50 U with breakfast and 150 U with dinner. Reason for hypoglycemia likely decreased PO intake along with continued high insulin dosage.  Initially requiring D10 drip which was slowly titrated off.  Discharging on Semglee 40 units [place] daily, Novolog 8u TIDAC.  Continue monitor glucose closely and uptitrate as needed but recommend cautious use given hypoglycemia requiring rehospitalization.   Left 1st proximal phalanx fracture, mildly displaced, initial encounter  Ground-level fall out of bed X-ray left foot with fracture base left great toe proximal flailing status mildly displaced. Postoperative shoe, weightbearing as tolerates   Recent open fracture R ankle s/p ORIF on 11/14  Open right trimalleolar ankle fracture dislocation including syndesmosis s/p I&D and ORIF of  fibula, medial malleolus, posterior malleolus and syndesmosis on 11/14. NWB x 8 weeks post op per Ortho; plan for splint to be converted to short leg cast at 2 wk post op. PT/OT eval recommend  return to SNF. Continue Vit D3 5000 IU daily. Outpatient follow-up with orthopedics, Dr. Carola Frost   Hypokalemia Repleted during hospitalization.   Resolving AKI stage II on CKD stage III:  Baseline Cr 1.8 - 2. Last admission peaked at 4.7, thought to be related to hypotension, NSAID, lasix, losartan I.e. prerenal. Back to 2.1 at time of admission.  Creatinine 2.54 at time of discharge.  Home losartan discontinued.  Recommend repeat BMP 1 week.   Anemia, secondary to acute blood loss, improving:  Baseline Hb 13-14, nadir of 8.6 last admission. Back to 10 this admission.    HTN Hx orthostatic hypotension:  Continue home Carvedilol 3.125mg  PO BID, started amlodipine 10mg  PO daily.  Furosemide reduced to 40 mg p.o. daily.  Losartan discontinued.  Continue to monitor blood pressure.   Hyperlipidemia:  Continue rosuvastatin 20mg  PO daily    Hypothyroid:  Recent TSH: 3.387. Continue home levothyroxine 88 mcg p.o. daily   OSA  Continue nocturnal CPAP   Paroxysmal atrial fibrillation:  Continue amiodarone, Eliquis   CAD/PAD/lower extremity venous stasis ulcers:  Hx CEA, cardiac and peripheral artery stents. Recently taken off Plavix and started on Eliquis.  Continue statin. Follows with outpatient wound care.   Chronic diastolic CHF:  TTE 02/2022: LVEF 50-55%.  Furosemide reduced to 40 mg p.o. daily.  Continue to monitor weights intermittently.   Anxiety and depression: Continue home Abilify and Cymbalta   Ambulatory dysfunction/frequent falls PT/OT recommend return to SNF   Morbid obesity, class III Body mass index is 42.17 kg/m.  Patient on [facility] outpatient and efforts to lose weight.  Complicates all facets of care.  Discharge Diagnoses:  Principal Problem:   Hypoglycemia due to insulin Active Problems:   Toe fracture, left   Ground-level fall   Acute metabolic encephalopathy   Hypokalemia    Discharge Instructions  Discharge Instructions     Call MD for:   difficulty breathing, headache or visual disturbances   Complete by: As directed    Call MD for:  extreme fatigue   Complete by: As directed    Call MD for:  persistant dizziness or light-headedness   Complete by: As directed    Call MD for:  persistant nausea and vomiting   Complete by: As directed    Call MD for:  severe uncontrolled pain   Complete by: As directed    Call MD for:  temperature >100.4   Complete by: As directed    Diet - low sodium heart healthy   Complete by: As directed    Increase activity slowly   Complete by: As directed    No wound care   Complete by: As directed       Allergies as of 01/14/2023       Reactions   Septra [bactrim] Itching   Penicillins Rash   Allergy to All cillin drugs  Ancef given 9/24 with no obvious reaction   Metformin And Related Diarrhea, Nausea Only   Other Nausea And Vomiting   General Anesthesia - sometimes causes nausea and vomiting * OK to use scopolamine patch     Sulfamethoxazole-trimethoprim Other (See Comments)   Wellbutrin [bupropion]    Mood Changes   Doxycycline Hives, Rash        Medication List  STOP taking these medications    HumuLIN R U-500 KwikPen 500 UNIT/ML KwikPen Generic drug: insulin regular human CONCENTRATED   HYDROcodone-acetaminophen 5-325 MG tablet Commonly known as: NORCO/VICODIN   meclizine 25 MG tablet Commonly known as: ANTIVERT   potassium chloride 10 MEQ CR capsule Commonly known as: MICRO-K   Tresiba FlexTouch 200 UNIT/ML FlexTouch Pen Generic drug: insulin degludec       TAKE these medications    acetaminophen 500 MG tablet Commonly known as: TYLENOL Take 1 tablet (500 mg total) by mouth every 6 (six) hours as needed for mild pain (pain score 1-3).   allopurinol 100 MG tablet Commonly known as: ZYLOPRIM Take 1 tablet (100 mg total) by mouth daily.   amiodarone 200 MG tablet Commonly known as: PACERONE Take 1 tablet (200 mg total) by mouth daily.   amLODipine  10 MG tablet Commonly known as: NORVASC Take 1 tablet (10 mg total) by mouth daily. Start taking on: January 15, 2023   apixaban 5 MG Tabs tablet Commonly known as: ELIQUIS Take 1 tablet (5 mg total) by mouth 2 (two) times daily.   ARIPiprazole 5 MG tablet Commonly known as: ABILIFY Take 5 mg by mouth daily.   B-D ULTRAFINE III SHORT PEN 31G X 8 MM Misc Generic drug: Insulin Pen Needle Inject 1 each into the skin 3 (three) times daily.   carvedilol 3.125 MG tablet Commonly known as: COREG TAKE 1 TABLET BY MOUTH TWICE A DAY WITH A MEAL   DULoxetine 60 MG capsule Commonly known as: CYMBALTA Take 60 mg by mouth daily.   ezetimibe 10 MG tablet Commonly known as: ZETIA Take 10 mg by mouth daily.   fluticasone 50 MCG/ACT nasal spray Commonly known as: FLONASE Place 2 sprays into both nostrils daily. Start taking on: January 15, 2023   FreeStyle Libre 14 Day Sensor Misc APPLY 1 SENSOR EVERY 14 DAYS   furosemide 80 MG tablet Commonly known as: LASIX Take 0.5 tablets (40 mg total) by mouth daily. May take additional tablet for weight gain of 3 pounds in one day or five pounds in one week What changed:  how much to take when to take this   gabapentin 300 MG capsule Commonly known as: NEURONTIN Take 1 capsule (300 mg total) by mouth 2 (two) times daily.   insulin aspart 100 UNIT/ML injection Commonly known as: novoLOG Inject 8 Units into the skin 3 (three) times daily with meals.   insulin glargine-yfgn 100 UNIT/ML injection Commonly known as: SEMGLEE Inject 0.4 mLs (40 Units total) into the skin daily.   levothyroxine 88 MCG tablet Commonly known as: SYNTHROID Take 88 mcg by mouth daily before breakfast.   loratadine 10 MG tablet Commonly known as: CLARITIN Take 1 tablet (10 mg total) by mouth daily. Start taking on: January 15, 2023   Mounjaro 15 MG/0.5ML Pen Generic drug: tirzepatide Inject 15 mg into the skin once a week.   multivitamin with minerals  Tabs tablet Take 1 tablet by mouth daily.   mupirocin ointment 2 % Commonly known as: BACTROBAN Apply 1 Application topically 2 (two) times daily as needed (wound care).   nitroGLYCERIN 0.4 MG SL tablet Commonly known as: NITROSTAT PLACE 1 TAB UNDER THE TONGUE EVERY 5 MINUTES AS NEEDED FOR CHEST PAIN (SEVERE PRESSURE OR TIGHTNESS)   oxyCODONE 5 MG immediate release tablet Commonly known as: Oxy IR/ROXICODONE Take 1 tablet (5 mg total) by mouth every 4 (four) hours as needed for moderate pain (pain score 4-6).  pantoprazole 40 MG tablet Commonly known as: PROTONIX TAKE 1 TABLET BY MOUTH EVERY DAY   PRESCRIPTION MEDICATION Pt uses CPAP Machine at bedtime   rosuvastatin 20 MG tablet Commonly known as: CRESTOR Take 20 mg by mouth every evening.   Vitamin D 125 MCG (5000 UT) Caps Take 1 capsule by mouth daily.        Follow-up Information     [name], MD. Schedule an appointment as soon as possible for a visit in 1 week(s).   Specialty: Internal Medicine Contact information: [address] [place] [address] [phone]         [name], MD. Schedule an appointment as soon as possible for a visit.   Specialty: Orthopedic Surgery Contact information: [address] [place] [address] [phone]                Allergies  Allergen Reactions   Septra [Bactrim] Itching   Penicillins Rash    Allergy to All cillin drugs  Ancef given 9/24 with no obvious reaction   Metformin And Related Diarrhea and Nausea Only   Other Nausea And Vomiting    General Anesthesia - sometimes causes nausea and vomiting * OK to use scopolamine patch     Sulfamethoxazole-Trimethoprim Other (See Comments)   Wellbutrin [Bupropion]     Mood Changes   Doxycycline Hives and Rash    Consultations: None   Procedures/Studies: DG Foot Complete Left  Result Date: 01/09/2023 CLINICAL DATA:  Fall, left foot pain EXAM: LEFT FOOT - COMPLETE 3+  VIEW COMPARISON:  09/09/2019 FINDINGS: Fracture noted at the base of the left 1st proximal phalanx. Fracture fragment mildly displaced. Degenerative changes at the 1st MTP joint and 1st IP joint. No subluxation or dislocation. Plantar calcaneal spur. Soft tissues are intact. IMPRESSION: Fracture at the base of the left great toe proximal phalanx. Electronically Signed   By: Charlett Nose M.D.   On: 01/09/2023 02:02   CT Cervical Spine Wo Contrast  Result Date: 01/09/2023 CLINICAL DATA:  Fall, on blood thinners EXAM: CT CERVICAL SPINE WITHOUT CONTRAST TECHNIQUE: Multidetector CT imaging of the cervical spine was performed without intravenous contrast. Multiplanar CT image reconstructions were also generated. RADIATION DOSE REDUCTION: This exam was performed according to the departmental dose-optimization program which includes automated exposure control, adjustment of the mA and/or kV according to patient size and/or use of iterative reconstruction technique. COMPARISON:  None available FINDINGS: Alignment: Normal. Skull base and vertebrae: No acute fracture. No primary bone lesion or focal pathologic process. Soft tissues and spinal canal: No prevertebral fluid or swelling. No visible canal hematoma. Disc levels: Early anterior spurring in the mid and lower cervical spine. Mild degenerative facet disease bilaterally. Upper chest: No acute findings Other: None IMPRESSION: No acute bony abnormality. Electronically Signed   By: Charlett Nose M.D.   On: 01/09/2023 01:08   CT Head Wo Contrast  Result Date: 01/09/2023 CLINICAL DATA:  Fall, on blood thinners. EXAM: CT HEAD WITHOUT CONTRAST TECHNIQUE: Contiguous axial images were obtained from the base of the skull through the vertex without intravenous contrast. RADIATION DOSE REDUCTION: This exam was performed according to the departmental dose-optimization program which includes automated exposure control, adjustment of the mA and/or kV according to patient size  and/or use of iterative reconstruction technique. COMPARISON:  None Available. FINDINGS: Brain: Diffuse cerebral atrophy. No acute intracranial abnormality. Specifically, no hemorrhage, hydrocephalus, mass lesion, acute infarction, or significant intracranial injury. Vascular: No hyperdense vessel or unexpected calcification.  Skull: No acute calvarial abnormality. Sinuses/Orbits: No acute findings Other: None IMPRESSION: Atrophy.  No acute intracranial abnormality. Electronically Signed   By: Charlett Nose M.D.   On: 01/09/2023 01:07   US RENAL  Result Date: 01/03/2023 CLINICAL DATA:  Acute renal insufficiency. EXAM: RENAL / URINARY TRACT ULTRASOUND COMPLETE COMPARISON:  None Available. FINDINGS: Right Kidney: Renal measurements: 12.8 x 8.7 x 6.8 cm = volume: 305 mL. There is moderate parenchyma atrophy and cortical thinning. Normal echogenicity. No hydronephrosis or shadowing stone. Left Kidney: Renal measurements: 10.1 x 6.8 x 6.4 cm = volume: 231 mL. Moderate parenchyma atrophy and cortical thinning. Normal echogenicity. No hydronephrosis or shadowing stone. Bladder: The urinary bladder is not visualized. Other: None. IMPRESSION: Moderately atrophic kidneys.  No hydronephrosis or shadowing stone. Electronically Signed   By: Elgie Collard M.D.   On: 01/03/2023 18:52   DG Ankle Complete Right  Result Date: 01/03/2023 CLINICAL DATA:  Fracture, postop. EXAM: RIGHT ANKLE - COMPLETE 3+ VIEW COMPARISON:  Preoperative imaging. FINDINGS: Lateral plate and screw fixation of distal fibular fracture. Two screws traverse the medial malleolar fracture. A single screw traverses the distal tibia. Two K-wires traverse the hindfoot. Fractures are in improved alignment from preoperative imaging. Overlying cast material which limits osseous and soft tissue fine detail. IMPRESSION: ORIF of distal tibia and fibula fractures and K wire fixation of the hindfoot. Electronically Signed   By: Narda Rutherford M.D.   On:  01/03/2023 11:22   DG Ankle Complete Right  Result Date: 01/02/2023 CLINICAL DATA:  Elective surgery. EXAM: RIGHT ANKLE - COMPLETE 3+ VIEW COMPARISON:  Preoperative imaging FINDINGS: Thirteen fluoroscopic spot views of the right ankle obtained in the operating room. Lateral plate and screw fixation of distal fibular fracture. Two screws traverse the medial malleolar fracture. Additional screw traverses the distal tibia. Possible K-wire fixation of the hindfoot. Fluoroscopy time 1 minutes 29 seconds. Dose 2.02 mGy. IMPRESSION: Intraoperative fluoroscopy during ankle fracture fixation. Electronically Signed   By: Narda Rutherford M.D.   On: 01/02/2023 17:34   DG C-Arm 1-60 Min-No Report  Result Date: 01/02/2023 Fluoroscopy was utilized by the requesting physician.  No radiographic interpretation.   DG C-Arm 1-60 Min-No Report  Result Date: 01/02/2023 Fluoroscopy was utilized by the requesting physician.  No radiographic interpretation.   DG C-Arm 1-60 Min-No Report  Result Date: 01/02/2023 Fluoroscopy was utilized by the requesting physician.  No radiographic interpretation.   CT ANKLE RIGHT WO CONTRAST  Result Date: 01/02/2023 CLINICAL DATA:  Right ankle injury after fall. EXAM: CT OF THE RIGHT ANKLE WITHOUT CONTRAST TECHNIQUE: Multidetector CT imaging of the right ankle was performed according to the standard protocol. Multiplanar CT image reconstructions were also generated. RADIATION DOSE REDUCTION: This exam was performed according to the departmental dose-optimization program which includes automated exposure control, adjustment of the mA and/or kV according to patient size and/or use of iterative reconstruction technique. COMPARISON:  Right ankle x-rays from same day. FINDINGS: Bones/Joint/Cartilage Acute oblique fracture through the base of the medial malleolus. The medial malleolar fragment is displaced up to 1.6 cm and internally rotated. Acute comminuted fracture of the distal  fibular diaphysis with up to 7 mm posterior displacement of the dominant fragments. The butterfly fragment is posteriorly displaced approximately 1 cm. Acute longitudinal fracture of the posterior malleolus with 5 mm posterior displacement and 2 mm superior displacement. There are additional tiny avulsion fractures at the inferior tip of the medial malleolar fragment, and anterior aspect of the lateral malleolus. Small  minimally depressed fracture of the posterior lateral talar dome (series 11, image 27). Slight lateral subluxation of the talar dome with respect to the tibial plafond. No dislocation. Mild midfoot and hindfoot degenerative changes. Prominent calcaneal peroneal tubercle. Small tibiotalar joint effusion with several foci of intra-articular air. Ligaments Ligaments are suboptimally evaluated by CT. Muscles and Tendons Grossly intact. Soft tissue Diffuse soft tissue swelling with multiple foci of subcutaneous emphysema. No fluid collection or hematoma. No soft tissue mass. IMPRESSION: 1. Acute open trimalleolar fracture as described above. 2. Small minimally depressed fracture of the posterior lateral talar dome. Electronically Signed   By: Obie Dredge M.D.   On: 01/02/2023 10:36   DG Chest Portable 1 View  Result Date: 01/02/2023 CLINICAL DATA:  Preop planning. EXAM: PORTABLE CHEST 1 VIEW COMPARISON:  AP Lat chest 07/10/2022 FINDINGS: The heart is slightly enlarged. No vascular congestion is seen. The mediastinal configuration is normal. There is calcification of the transverse aorta. The lungs are clear. There is no substantial pleural effusion. Thoracic spondylosis with no new osseous findings. Compare: Stable chest. IMPRESSION: No evidence of acute chest disease. Slight cardiomegaly. Aortic atherosclerosis. Stable chest. Electronically Signed   By: Almira Bar M.D.   On: 01/02/2023 07:29   DG Ankle 2 Views Right  Result Date: 01/02/2023 CLINICAL DATA:  67 year old male history of  open fracture of the right ankle status post reduction. EXAM: RIGHT ANKLE - 2 VIEW COMPARISON:  01/02/2023. FINDINGS: Compared to the prior examination there has been substantial improvement in alignment of the previously described fracture/dislocation of the right ankle. Comminuted displaced fracture of the distal third of the fibular diaphysis is again noted, now with predominantly 7 mm of posterior displacement of the distal fracture fragments and minimal residual angulation. The distal tibia is now associated with the talus, with some mild widening of the medial aspect of the tibiotalar joint. Osseous fragment adjacent to the medial malleolus remains displaced with some varus rotation. There also appears to be an osseous defect along the anterior aspect of the distal tibia, suggesting additional fracture. Interval placement of overlying splint material obscures finer bony detail. IMPRESSION: 1. Status post closed reduction splint fixation of previously noted fracture dislocation of the right ankle with improved alignment, as above. Electronically Signed   By: Trudie Reed M.D.   On: 01/02/2023 06:36   DG Ankle 2 Views Right  Result Date: 01/02/2023 CLINICAL DATA:  67 year old male with history of open fracture of the right ankle. EXAM: RIGHT ANKLE - 2 VIEW COMPARISON:  Right ankle radiograph 09/09/2019. FINDINGS: Two views of the right ankle demonstrates severe fracture/dislocation of the right ankle. Specifically, there is a displaced, comminuted and angulated fracture of the distal third of the fibular diaphysis, with proximally 30 degrees of lateral angulation. Distal fibular fracture remains associated with the talus. Complete dislocation of the tibia relative to the talus, with the distal tibia lying completely medial to the talus and slightly posterior, likely impacted upon the bone. There is an acute displaced fracture of the medial malleolus with proximally 1.2 cm of distal migration of the  distal fracture fragment. Overlying soft tissues are swollen and grossly distorted. Finer bony detail is obscured by overlying drape materials. IMPRESSION: 1. Severe fracture dislocation of the right ankle joint, as detailed above. Electronically Signed   By: Trudie Reed M.D.   On: 01/02/2023 06:22     Subjective: Patient seen examined bedside, resting calmly.  Lying in bed.  No complaints this morning.  Discharging  to rehab today.  Denies headache, no dizziness, no chest pain, palpitations, no shortness of breath, no abdominal pain, no fever/chills/night sweats, no nausea/vomiting/diarrhea, no focal weakness, no fatigue, no paresthesias.  No acute events overnight per nursing staff.  Discharge Exam: Vitals:   01/14/23 0629 01/14/23 0817  BP: (!) 149/58 (!) 129/57  Pulse: 63 66  Resp: 17   Temp: 98.4 F (36.9 C) (!) 97.5 F (36.4 C)  SpO2: 100% 97%   Vitals:   01/13/23 1501 01/13/23 2104 01/14/23 0629 01/14/23 0817  BP: (!) 148/58 (!) 170/75 (!) 149/58 (!) 129/57  Pulse: 63 63 63 66  Resp: 17 17 17    Temp: (!) 97.5 F (36.4 C) (!) 97.4 F (36.3 C) 98.4 F (36.9 C) (!) 97.5 F (36.4 C)  TempSrc: Oral Axillary Axillary Oral  SpO2: 90% 100% 100% 97%  Weight:      Height:        Physical Exam: GEN: NAD, alert and oriented x 3, morbidly obese, chronically ill in appearance HEENT: NCAT, PERRL, EOMI, sclera clear, MMM PULM: CTAB w/o wheezes/crackles, normal respiratory effort, on room air CV: RRR w/o M/G/R GI: abd soft, NTND, NABS, no R/G/M MSK: no peripheral edema, muscle strength globally intact 5/5 bilateral upper/lower extremities NEURO: CN II-XII intact, no focal deficits, sensation to light touch intact PSYCH: normal mood/affect Integumentary: Right forearm abrasion noted with 1 cm hemorrhagic bulla in the webspace between first and second digit right hand, right lower extremity with dressing/splint in place, left foot with ecchymosis greatest at the great toe, otherwise  no other concerning rashes/lesions/wounds noted on exposed skin surfaces.    The results of significant diagnostics from this hospitalization (including imaging, microbiology, ancillary and laboratory) are listed below for reference.     Microbiology: No results found for this or any previous visit (from the past 240 hour(s)).   Labs: BNP (last 3 results) No results for input(s): "BNP" in the last 8760 hours. Basic Metabolic Panel: Recent Labs  Lab 01/08/23 2356 01/09/23 0004 01/09/23 0622 01/10/23 0725 01/11/23 0528 01/12/23 0609  NA 139 140 138 141 140 137  K 3.0* 2.9* 3.8 4.0 3.5 3.9  CL 103 100 101 106 106 104  CO2 26  --  29 21* 26 24  GLUCOSE 662* 690* 120* 190* 197* 237*  BUN 32* 31* 31* 35* 31* 32*  CREATININE 2.18* 2.10* 2.18* 2.85* 2.48* 2.54*  CALCIUM 8.6*  --  8.6* 8.3* 8.3* 8.4*  MG  --   --  2.2 2.1  --   --   PHOS  --   --  3.4  --   --   --    Liver Function Tests: Recent Labs  Lab 01/08/23 2356  AST 29  ALT 16  ALKPHOS 68  BILITOT 0.7  PROT 5.7*  ALBUMIN 3.0*   No results for input(s): "LIPASE", "AMYLASE" in the last 168 hours. No results for input(s): "AMMONIA" in the last 168 hours. CBC: Recent Labs  Lab 01/08/23 2356 01/09/23 0004  WBC 9.4  --   NEUTROABS 7.0  --   HGB 10.3* 10.2*  HCT 30.3* 30.0*  MCV 91.8  --   PLT 220  --    Cardiac Enzymes: Recent Labs  Lab 01/08/23 2356  CKTOTAL 207   BNP: Invalid input(s): "POCBNP" CBG: Recent Labs  Lab 01/13/23 0740 01/13/23 1136 01/13/23 1625 01/13/23 2034 01/14/23 0733  GLUCAP 187* 174* 177* 190* 149*   D-Dimer No results for input(s): "DDIMER" in  the last 72 hours. Hgb A1c No results for input(s): "HGBA1C" in the last 72 hours. Lipid Profile No results for input(s): "CHOL", "HDL", "LDLCALC", "TRIG", "CHOLHDL", "LDLDIRECT" in the last 72 hours. Thyroid function studies No results for input(s): "TSH", "T4TOTAL", "T3FREE", "THYROIDAB" in the last 72 hours.  Invalid  input(s): "FREET3" Anemia work up No results for input(s): "VITAMINB12", "FOLATE", "FERRITIN", "TIBC", "IRON", "RETICCTPCT" in the last 72 hours. Urinalysis    Component Value Date/Time   COLORURINE YELLOW 01/02/2023 0812   APPEARANCEUR HAZY (A) 01/02/2023 0812   LABSPEC 1.017 01/02/2023 0812   PHURINE 5.0 01/02/2023 0812   GLUCOSEU >=500 (A) 01/02/2023 0812   HGBUR MODERATE (A) 01/02/2023 0812   BILIRUBINUR NEGATIVE 01/02/2023 0812   BILIRUBINUR negative 05/12/2018 1213   BILIRUBINUR small 06/29/2014 0859   KETONESUR 5 (A) 01/02/2023 0812   PROTEINUR >=300 (A) 01/02/2023 0812   UROBILINOGEN 0.2 05/12/2018 1213   UROBILINOGEN 1.0 05/02/2014 2015   NITRITE NEGATIVE 01/02/2023 0812   LEUKOCYTESUR NEGATIVE 01/02/2023 0812   Sepsis Labs Recent Labs  Lab 01/08/23 2356  WBC 9.4   Microbiology No results found for this or any previous visit (from the past 240 hour(s)).   Time coordinating discharge: Over 30 minutes  SIGNED:   Alvira Philips [place], DO  [facility] 01/14/2023, 9:25 AM

## 2023-01-14 NOTE — TOC Progression Note (Signed)
Transition of Care [facility]) - Progression Note    Patient Details  Name: Harold [name] MRN: [id number] Date of Birth: 08-18-1955  Transition of [facility]) CM/SW Contact  Lorri Frederick, LCSW Phone Number: 01/14/2023, 8:32 AM  Clinical Narrative:   SNF auth request approved in [place]: [id number], [id number], 7 days: 11/26-12/2.  MD informed.    Expected Discharge Plan: Skilled Nursing Facility Barriers to Discharge: Continued Medical Work up, SNF Pending bed offer  Expected Discharge Plan and Services In-house Referral: Clinical Social Work   Post Acute Care Choice: Skilled Nursing Facility Living arrangements for the past 2 months: Single Family Home                                       Social Determinants of Health (SDOH) Interventions SDOH Screenings   Food Insecurity: No Food Insecurity (01/09/2023)  Housing: Low Risk  (01/09/2023)  Transportation Needs: No Transportation Needs (01/09/2023)  Utilities: Not At Risk (01/09/2023)  Tobacco Use: Medium Risk (01/10/2023)    Readmission Risk Interventions     No data to display

## 2023-01-15 DIAGNOSIS — I272 Pulmonary hypertension, unspecified: Secondary | ICD-10-CM | POA: Diagnosis not present

## 2023-01-15 DIAGNOSIS — R2689 Other abnormalities of gait and mobility: Secondary | ICD-10-CM | POA: Diagnosis not present

## 2023-01-15 DIAGNOSIS — D649 Anemia, unspecified: Secondary | ICD-10-CM | POA: Diagnosis not present

## 2023-01-15 DIAGNOSIS — F3341 Major depressive disorder, recurrent, in partial remission: Secondary | ICD-10-CM

## 2023-01-15 DIAGNOSIS — M6281 Muscle weakness (generalized): Secondary | ICD-10-CM | POA: Diagnosis not present

## 2023-01-15 DIAGNOSIS — I480 Paroxysmal atrial fibrillation: Secondary | ICD-10-CM | POA: Diagnosis not present

## 2023-01-15 DIAGNOSIS — E876 Hypokalemia: Secondary | ICD-10-CM | POA: Diagnosis not present

## 2023-01-15 DIAGNOSIS — D72829 Elevated white blood cell count, unspecified: Secondary | ICD-10-CM | POA: Diagnosis not present

## 2023-01-15 DIAGNOSIS — J449 Chronic obstructive pulmonary disease, unspecified: Secondary | ICD-10-CM | POA: Diagnosis not present

## 2023-01-15 DIAGNOSIS — E559 Vitamin D deficiency, unspecified: Secondary | ICD-10-CM | POA: Diagnosis not present

## 2023-01-15 DIAGNOSIS — E039 Hypothyroidism, unspecified: Secondary | ICD-10-CM | POA: Diagnosis not present

## 2023-01-20 ENCOUNTER — Other Ambulatory Visit: Payer: Self-pay | Admitting: *Deleted

## 2023-01-20 NOTE — Patient Outreach (Signed)
[name] resides in [facility]. Screening for potential chronic care management services as a benefit of health plan and primary care provider.  Spoke with [place], Geophysicist/field seismologist. [name] is from home with spouse. Transition plans are to return home.   Discussed writer will reach out to [name] to discuss complex care management services.  Raiford Noble, MSN, RN, BSN [place]  [facility], [facility] RN Post- Acute Care Manager Direct Dial: [phone]

## 2023-01-22 DIAGNOSIS — T8484XD Pain due to internal orthopedic prosthetic devices, implants and grafts, subsequent encounter: Secondary | ICD-10-CM | POA: Diagnosis not present

## 2023-01-22 DIAGNOSIS — S82851E Displaced trimalleolar fracture of right lower leg, subsequent encounter for open fracture type I or II with routine healing: Secondary | ICD-10-CM | POA: Diagnosis not present

## 2023-01-22 DIAGNOSIS — S9304XD Dislocation of right ankle joint, subsequent encounter: Secondary | ICD-10-CM | POA: Diagnosis not present

## 2023-01-22 DIAGNOSIS — S93431D Sprain of tibiofibular ligament of right ankle, subsequent encounter: Secondary | ICD-10-CM | POA: Diagnosis not present

## 2023-01-24 DIAGNOSIS — G4733 Obstructive sleep apnea (adult) (pediatric): Secondary | ICD-10-CM | POA: Diagnosis not present

## 2023-01-27 NOTE — Progress Notes (Signed)
SDW Call attempt:   Multiple messages left for staff at [facility] at [phone], no one returned these calls.  Unable to verify history, medications, last dose of Eliquis or Mounjaro, or average range of blood sugars.  Pre-op surgical instructions faxed to facility at [phone].

## 2023-01-27 NOTE — Anesthesia Preprocedure Evaluation (Signed)
Anesthesia Evaluation  Patient identified by MRN, date of birth, ID band Patient awake    Reviewed: Allergy & Precautions, NPO status , Patient's Chart, lab work & pertinent test results, reviewed documented beta blocker date and time   History of Anesthesia Complications (+) PONV and history of anesthetic complications  Airway Mallampati: III  TM Distance: >3 FB Neck ROM: Full    Dental no notable dental hx. (+) Teeth Intact, Dental Advisory Given   Pulmonary sleep apnea and Continuous Positive Airway Pressure Ventilation , COPD, former smoker   Pulmonary exam normal breath sounds clear to auscultation       Cardiovascular hypertension, Pt. on medications and Pt. on home beta blockers pulmonary hypertension+ angina  + CAD, + Past MI, + Cardiac Stents and + Peripheral Vascular Disease  Normal cardiovascular exam+ dysrhythmias Atrial Fibrillation  Rhythm:Regular Rate:Normal  TTE 2024 1. EF challenging to assess with afib/ challenging windows. Left  ventricular ejection fraction, by estimation, is 50 to 55%. The left  ventricle has low normal function. The left ventricle demonstrates global  hypokinesis. There is mild concentric left  ventricular hypertrophy. Left ventricular diastolic parameters are  indeterminate.   2. Right ventricular systolic function is normal. The right ventricular  size is normal.   3. Left atrial size was mildly dilated.   4. Trivial mitral valve regurgitation.   5. There is mild calcification of the aortic valve. Aortic valve  regurgitation is not visualized.   6. There is mild dilatation of the ascending aorta, measuring 36 mm.   7. The inferior vena cava is normal in size with greater than 50%  respiratory variability, suggesting right atrial pressure of 3 mmHg.   Stress Test 2023 Findings are consistent with prior myocardial infarction. The study is intermediate risk.   No ST deviation was  noted.   LV perfusion is abnormal. There is no evidence of ischemia. There is evidence of infarction. Defect 1: There is a medium defect with mild reduction in uptake present in the apical to basal inferior location(s) that is fixed. There is abnormal wall motion in the defect area. Consistent with infarction.   Left ventricular function is abnormal. Global function is moderately reduced. Nuclear stress EF: 36 %. The left ventricular ejection fraction is moderately decreased (30-44%). End diastolic cavity size is moderately enlarged. End systolic cavity size is moderately enlarged.   Prior study available for comparison.   1. Fixed inferior perfusion defect with hypokinesis consistent with infarct 2. Moderate LV systolic dysfunction (EF 36%) 3. Intermediate risk study due to decreased LV systolic function.  No ischemia     Neuro/Psych  PSYCHIATRIC DISORDERS Anxiety Depression    negative neurological ROS     GI/Hepatic Neg liver ROS,GERD  ,,  Endo/Other  diabetes, Type 2, Insulin DependentHypothyroidism    Renal/GU Renal InsufficiencyRenal disease  negative genitourinary   Musculoskeletal negative musculoskeletal ROS (+)    Abdominal   Peds  Hematology  (+) Blood dyscrasia (eliquis)   Anesthesia Other Findings   Reproductive/Obstetrics                             Anesthesia Physical Anesthesia Plan  ASA: 3  Anesthesia Plan: General   Post-op Pain Management: Tylenol PO (pre-op)*   Induction: Intravenous  PONV Risk Score and Plan: 3 and Midazolam, Dexamethasone, Ondansetron and Scopolamine patch - Pre-op  Airway Management Planned: Oral ETT and Video Laryngoscope Planned  Additional Equipment:  Intra-op Plan:   Post-operative Plan: Extubation in OR  Informed Consent: I have reviewed the patients History and Physical, chart, labs and discussed the procedure including the risks, benefits and alternatives for the proposed anesthesia with  the patient or authorized representative who has indicated his/her understanding and acceptance.     Dental advisory given  Plan Discussed with: CRNA  Anesthesia Plan Comments:        Anesthesia Quick Evaluation

## 2023-01-27 NOTE — Progress Notes (Signed)
Surgical Instructions   Your procedure is scheduled on Tuesday January 28, 2023. Report to [facility] Main Entrance "A" at 8:25 A.M., then check in with the Admitting office. Any questions or running late day of surgery: call [phone]  Questions prior to your surgery date: call [phone], Monday-Friday, 8am-4pm. If you experience any cold or flu symptoms such as cough, fever, chills, shortness of breath, etc. between now and your scheduled surgery, please notify us at the above number.    Remember:  Do not eat or drink after midnight the night before your surgery   Take these medicines the morning of surgery with A SIP OF WATER  allopurinol (ZYLOPRIM)  amiodarone (PACERONE)  amLODipine (NORVASC)  ARIPiprazole (ABILIFY)  carvedilol (COREG)  DULoxetine (CYMBALTA)  ezetimibe (ZETIA)  fluticasone (FLONASE)  gabapentin (NEURONTIN)  levothyroxine (SYNTHROID, LEVOTHROID)  loratadine (CLARITIN)  pantoprazole (PROTONIX)    May take these medicines IF NEEDED: acetaminophen (TYLENOL)  nitroGLYCERIN (NITROSTAT).  If you have to take this medication prior to surgery, please call [phone] and report this to a nurse oxyCODONE (OXY IR/ROXICODONE)   Follow your surgeon's instructions on when to stop apixaban (ELIQUIS) .  If no instructions were given by your surgeon then you will need to call the office to get those instructions.    One week prior to surgery, STOP taking any Aspirin (unless otherwise instructed by your surgeon) Aleve, Naproxen, Ibuprofen, Motrin, Advil, Goody's, BC's, all herbal medications, fish oil, and non-prescription vitamins.    WHAT DO I DO ABOUT MY DIABETES MEDICATION?   Do not take oral diabetes medicines (pills) the morning of surgery.        DO NOT USE YOUR tirzepatide (MOUNJARO) 7 DAYS PRIOR TO SURGERY, WITH THE LAST DOSE BEING NO LATER THAN 01/20/2023.    DEPENDING ON WHEN YOU TAKE YOUR insulin glargine-yfgn (SEMGLEE), USE ONLY 20 UNITS WHICH IS  50% OR YOUR REGULAR DOSE.       THE MORNING OF SURGERY, take zero units of your insulin aspart (NOVOLOG). If your CBG is greater than 220 mg/dL, you may take  of your sliding scale (correction) dose of insulin.    The day of surgery, do not take other diabetes injectables, including Byetta (exenatide), Bydureon (exenatide ER), Victoza (liraglutide), or Trulicity (dulaglutide).   HOW TO MANAGE YOUR DIABETES BEFORE AND AFTER SURGERY  Why is it important to control my blood sugar before and after surgery? Improving blood sugar levels before and after surgery helps healing and can limit problems. A way of improving blood sugar control is eating a healthy diet by:  Eating less sugar and carbohydrates  Increasing activity/exercise  Talking with your doctor about reaching your blood sugar goals High blood sugars (greater than 180 mg/dL) can raise your risk of infections and slow your recovery, so you will need to focus on controlling your diabetes during the weeks before surgery. Make sure that the doctor who takes care of your diabetes knows about your planned surgery including the date and location.  How do I manage my blood sugar before surgery? Check your blood sugar at least 4 times a day, starting 2 days before surgery, to make sure that the level is not too high or low.  Check your blood sugar the morning of your surgery when you wake up and every 2 hours until you get to the Short Stay unit.  If your blood sugar is less than 70 mg/dL, you will need to treat for low blood sugar: Do not take  insulin. Treat a low blood sugar (less than 70 mg/dL) with  cup of clear juice (cranberry or apple), 4 glucose tablets, OR glucose gel. Recheck blood sugar in 15 minutes after treatment (to make sure it is greater than 70 mg/dL). If your blood sugar is not greater than 70 mg/dL on recheck, call [phone] for further instructions. Report your blood sugar to the short stay nurse when you get to Short  Stay.  If you are admitted to the hospital after surgery: Your blood sugar will be checked by the staff and you will probably be given insulin after surgery (instead of oral diabetes medicines) to make sure you have good blood sugar levels. The goal for blood sugar control after surgery is 80-180 mg/dL.                      Do NOT Smoke (Tobacco/Vaping) for 24 hours prior to your procedure.  If you use a CPAP at night, you may bring your mask/headgear for your overnight stay.   You will be asked to remove any contacts, glasses, piercing's, hearing aid's, dentures/partials prior to surgery. Please bring cases for these items if needed.    Patients discharged the day of surgery will not be allowed to drive home, and someone needs to stay with them for 24 hours.  SURGICAL WAITING ROOM VISITATION Patients may have no more than 2 support people in the waiting area - these visitors may rotate.   Pre-op nurse will coordinate an appropriate time for 1 ADULT support person, who may not rotate, to accompany patient in pre-op.  Children under the age of 27 must have an adult with them who is not the patient and must remain in the main waiting area with an adult.  If the patient needs to stay at the hospital during part of their recovery, the visitor guidelines for inpatient rooms apply.  Please refer to the [facility] website for the visitor guidelines for any additional information.   If you received a COVID test during your pre-op visit  it is requested that you wear a mask when out in public, stay away from anyone that may not be feeling well and notify your surgeon if you develop symptoms. If you have been in contact with anyone that has tested positive in the last 10 days please notify you surgeon.      Pre-operative CHG Bathing Instructions   You can play a key role in reducing the risk of infection after surgery. Your skin needs to be as free of germs as possible. You can reduce the  number of germs on your skin by washing with Dial soap before surgery.              TAKE A SHOWER THE NIGHT BEFORE SURGERY AND THE DAY OF SURGERY    Please keep in mind the following:  DO NOT shave, including legs and underarms, 48 hours prior to surgery.   You may shave your face before/day of surgery.  Place clean sheets on your bed the night before surgery Use a clean washcloth (not used since being washed) for each shower. DO NOT sleep with pet's night before surgery.  Dial soap Shower Instructions:  Oncologist and private area with [employer]. If you choose to wash your hair, wash first with your normal shampoo.  After you use shampoo/soap, rinse your hair and body thoroughly to remove shampoo/soap residue.  3    Pat dry with a  clean towel  4    Put on clean pajamas    Additional instructions for the day of surgery: DO NOT APPLY any lotions, deodorants or cologne.    Do not wear jewelry.  Do not bring valuables to the hospital. [facility] is not responsible for valuables/personal belongings. Put on clean/comfortable clothes.  Please brush your teeth.  Ask your nurse before applying any prescription medications to the skin.

## 2023-01-27 NOTE — Progress Notes (Signed)
Anesthesia Chart Review: Harold [name]  Case: [id number] Date/Time: 01/28/23 1037   Procedure: HARDWARE REMOVAL (Right)   Anesthesia type: Choice   Pre-op diagnosis: BROKEN HARDWARE RIGHT ANKLE   [name] 03 / [facility]   Surgeons: [name], MD       DISCUSSION:  Patient is a 67 year old male scheduled for the above procedure.   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  [place] admission 01/02/23 - 01/07/23 for open right ankle fracture due to mechanical fall. S/p I&D and ORIF 01/02/23 by Dr. Carola Frost. Nephrology consulted for acute on chronic kidney injury. Baseline ~ 1.8-2.0 but peaked at 4.7 and felt due to ATN from hypotension, NSAIDS, Lasix and ARB.  Lasix held briefly. Losartan discontinued with consideration of resuming if become hypertensive.  Discharged to SNF. Readmission 01/08/23-01/14/23 for acute metabolic encephalopathy secondary to hypoglycemia. Found on the floor by SNF roommate. CBG 40's, treated with D50 then D10 gtt. DM meds adjusted included discontinuation of Humulin R U500. Xray reviewed 1st proximal phalanx  fracture mildly displaced and managed with postoperative shoe with WBAT. Continue out-patient ortho follow-up.  He is followed by [facility]. Last visit 12/17/22 with Azalee Course, PA. He is followed for PAD, afib (last DCCV 07/25/22), CAD, HFpEF. 02/07/22 nuclear stress test consistent with prior MI, EF 36% (50-55% by 02/2022 echo, no ischemia). 02/21/22 echo showed LVEF 50-55%, global LV hypokinesis, mild concentric LVH, normal RV systolic function, mildly dilated LA, trivail MR. His claudication had improved following 10/2022 left tibial and popliteal artery angioplasties. Plavix discontinued. On amiodarone and Eliquis for afib. Known chronic venous insufficiency with stasis dermatitis and previous venous wounds followed by wound care. Overall felt to be doing well. 2 month follow-up planned with primary cardiology and 6 month follow-up with [facility] cardiology planned.   As above, known CKD that is followed by Dr. Marisue Humble. Last Creatinine on 01/12/23 was 2.54 (Cr 2.10-2.85 since AKI with peak Cr 4.7 01/2023).   A1c 7.0% on 01/02/23. Diabetic medication listed on current medication list include Novolog 8 units TID with meals, Semglee 40 units daily, Mounjaro 15 mg weekly.   Per PAT RN on 01/27/23: "SDW Call attempt: Multiple messages left for staff at [facility] at [phone], no one returned these calls. Unable to verify history, medications, last dose of Eliquis or Mounjaro, or average range of blood sugars. Pre-op surgical instructions faxed to facility at [phone]."  Anesthesia team to evaluate on the day of surgery.      VS:  Wt Readings from Last 3 Encounters:  01/09/23 (!) 145 kg  01/07/23 (!) 144.2 kg  12/17/22 (!) 143.3 kg   BP Readings from Last 3 Encounters:  01/14/23 (!) 129/57  01/07/23 125/71  12/17/22 138/66   Pulse Readings from Last 3 Encounters:  01/14/23 66  01/07/23 73  12/17/22 61     PROVIDERS: [name], MD s PCP Dohmeier, Porfirio Mylar, MD is neurologist  (OSA) [name], MD is cardiologist Lorine Bears, MD is [facility] cardiologist Mealor, Jari Pigg, MD is EP cardiologist  Fabienne Bruns, MD is vascular surgeon (inactive). Last visit 07/12/22 with Talmage Coin, PA-C for carotid artery stenosis follow-up. One year follow-up planned. Sabra Heck, MD is nephrologist Dorisann Frames, MD is endocrionlogist   LABS: For day of surgery as indicated. Last results in [facility] include: Lab Results  Component Value Date   WBC 9.4 01/08/2023   HGB 10.2 (L) 01/09/2023   HCT 30.0 (L) 01/09/2023   PLT 220 01/08/2023   GLUCOSE 237 (H) 01/12/2023   CHOL 147 11/07/2022   TRIG 181 (H) 11/07/2022   HDL 57 11/07/2022   LDLCALC 54 11/07/2022   ALT 16 01/08/2023   AST 29 01/08/2023   NA 137 01/12/2023   K 3.9 01/12/2023   CL 104 01/12/2023   CREATININE 2.54 (H) 01/12/2023   BUN 32 (H) 01/12/2023   CO2 24 01/12/2023   TSH 3.387 01/02/2023   INR 1.3 (H) 01/02/2023   HGBA1C 7.0 (H) 01/02/2023    IMAGES: Xray left foot 01/09/23: IMPRESSION: Fracture at the base of the left great toe proximal phalanx.  CT C-spine 01/09/23: IMPRESSION: No acute bony abnormality.  CT Head 01/09/23: IMPRESSION: Atrophy.  No acute intracranial abnormality.  US Renal 01/03/23: IMPRESSION: Moderately atrophic kidneys.  No hydronephrosis or shadowing stone.   EKG: EKG 01/09/23: Sinus rhythm Prolonged PR interval Nonspecific intraventricular conduction delay Nonspecific T abnormalities, lateral leads Confirmed by Drema Pry [phone]) on 01/09/2023 12:40:40 AM   CV: US Carotid 07/12/22: Summary:  - Right Carotid: Velocities in the right ICA are consistent with a 1-39%  stenosis.  - Left Carotid: Velocities in the left ICA are consistent with a 40-59%  stenosis.  - Vertebrals: Bilateral systolic deceleration noted in bilateral vertebral               arteries.  - Subclavians: Normal flow hemodynamics were seen in bilateral subclavian                arteries.    Longer term monitor 02/21/22 - 03/06/22:   ~13 (of 14) Day Zio Patch: January 2024:   Predominant underlying rhythm was atrial fibrillation with a rate range of 61 to 150 bpm.   2 short Ventricular runs (~Non-sustained Ventricular Tachycardia) noted: Fastest and longest was 14 beats (5.4 sec) w/ HR range of 102-107 bpm, avg 156 bpm. => Cannot exclude aberrantly conducted A-fib   A-fib with RVR and PVCs noted her symptoms.   -  Monitor shows 100% A-fib burden went through short ventricular runs. -  Symptomatic A-fib RVR with PVCs.    Echo 02/21/22: IMPRESSIONS   1. EF challenging to assess with afib/ challenging windows. Left  ventricular ejection fraction, by estimation, is 50 to 55%. The left  ventricle has low normal function. The left ventricle demonstrates global  hypokinesis. There is mild concentric left  ventricular hypertrophy. Left ventricular diastolic parameters are  indeterminate.   2. Right ventricular systolic function is normal. The right ventricular  size is normal.   3. Left atrial size was mildly dilated.   4. Trivial mitral valve regurgitation.   5. There is mild calcification of the aortic valve. Aortic valve  regurgitation is not visualized.   6. There is mild dilatation of the ascending aorta, measuring 36 mm.   7. The  inferior vena cava is normal in size with greater than 50%  respiratory variability, suggesting right atrial pressure of 3 mmHg.    Nuclear stress test 02/07/22:   Findings are consistent with prior myocardial infarction. The study is intermediate risk.   No ST deviation was noted.   LV perfusion is abnormal. There is no evidence of ischemia. There is evidence of infarction. Defect 1: There is a medium defect with mild reduction in uptake present in the apical to basal inferior location(s) that is fixed. There is abnormal wall motion in the defect area. Consistent with infarction.   Left ventricular function is abnormal. Global function  is moderately reduced. Nuclear stress EF: 36 %. The left ventricular ejection fraction is moderately decreased (30-44%). End diastolic cavity size is moderately enlarged. End systolic cavity size is moderately enlarged.   Prior study available for comparison.   Fixed inferior perfusion defect with hypokinesis consistent with infarct Moderate LV systolic dysfunction (EF 36%) Intermediate risk study due to decreased LV systolic function.  No ischemia    RHC/LHC 05/15/13: HEMODYNAMICS: RA: mean 13 RV: 46/12 PA: 46/16 mean 33 Pc: mean 22 v wave 34   Ao: 170/83 LV 170/24   Simultaneous LV 166/27 and RV 45/18   Cardiac output: 6.2 (thermodilution); 5.9 (Fick) Cardiac index:   2.4                            2.2   ANGIOGRAPHY: 1. Left main: Angiographically normal vessel which bifurcated into the LAD and left circumflex coronary artery 2. LAD: Widely patent mid LAD stent. The LAD gave rise to 3 diagonal vessels. At the apex there was a previously noted 90% focal stenosis in a small caliber apical LAD vessel 3. Left circumflex: Gave rise to a proximal high marginal vessel. There was a stent in the proximal circumflex which was widely patent and another stent to the mid circumflex which also was widely patent. In between the 2 stented segments there was the previously noted subtotal small caliber marginal branch.  4. Right coronary artery: Widely patent proximal MID and distal stents. There is evidence for 70-80% stenosis in the small caliber continuation branch of the RCA after the PDA takeoff. This was present on the previous angiograms of March 2014.   Left ventriculography revealed moderate left ventricle hypertrophy with an ejection fraction of 55% without focal segmental wall motion abnormalities.   IMPRESSION: - Normal LV contractility with moderate left ventricular hypertrophy. - Previously noted multivessel coronary artery disease with evidence for widely patent mid LAD stent and a 90%  apical LAD stenosis; widely patent proximal and mid left circumflex stents with previously noted subtotal occlusion of a small OM 2 vessel; and widely patent RCA ostial, mid, and distal stents with previously noted 70-80% stenosis in the continuation branch of the RCA. - Elevated right heart pressures with moderate pulmonary hypertension. - Systemic hypertension.   RECOMMENDATION: Medical therapy.     Past Medical History:  Diagnosis Date   Anginal pain (HCC) 04/2013   Cardiac cath showed patent stents with distal LAD, circumflex-OM and RCA disease in small vessels.   Anxiety    Atrial fibrillation (HCC) 12/2021   CAD S/P percutaneous coronary angioplasty 2003, 04/2012   status post PCI to LAD, circumflex-OM 2, RCA   Carotid artery occlusion    Chronic renal insufficiency, stage II (mild)    Chronic venous insufficiency    varicosities, no reflux; dopplers 04/14/12-  valvular insufficiency in the R and L GSV   Complication of anesthesia    COPD, mild (HCC)    Depression    situaltional    Diabetes mellitus    Diabetic neuropathy (HCC) 08/24/2019   Dyslipidemia associated with type 2 diabetes mellitus (HCC)    GERD (gastroesophageal reflux disease)    History of cardioversion 12/2021   at [facility]   HTN (hypertension)    Hypothyroidism    Neuromuscular disorder (HCC)    neuropathy in feet   Neuropathy    notably improved following PCI with improved cardiac function   Obesity    Obesity, Class II, BMI 35.0-39.9, with comorbidity (see actual BMI)    BMI 39; wgt loss efforts in place; seeing Dietician   PONV (postoperative nausea and vomiting)    "Patch Works"   Pulmonary hypertension (HCC) 04/2012   PA pressure   Sleep apnea    uses nightly    Past Surgical History:  Procedure Laterality Date   ABDOMINAL AORTAGRAM N/A 11/15/2011   Procedure: ABDOMINAL Ronny Flurry;  Surgeon: Sherren Kerns, MD;  Location: [facility] CATH LAB;  Service: Cardiovascular;  [name];    ABDOMINAL AORTOGRAM W/LOWER EXTREMITY N/A 10/13/2019   Procedure: ABDOMINAL AORTOGRAM W/LOWER EXTREMITY;  Surgeon: Iran Ouch, MD;  Location: [facility] INVASIVE CV LAB;  Service: CV: Non-obst Ao-Iliac. R SFA-PopA patent with significant calcific stenosis of TP trunk-prox PTA (dominant) & CTO ATA  -> R PTA-TP PTCA   ABDOMINAL AORTOGRAM W/LOWER EXTREMITY N/A 11/06/2022   Procedure: ABDOMINAL AORTOGRAM W/LOWER EXTREMITY;  Surgeon: Iran Ouch, MD;  [facility]: [facility] INVAS[facility] CV LAB;  Service: Cardiovascular;  [name];   ABIs  04/27/2012   mild bilateral arterial insufficiency   BACK SURGERY  2005 x1   X2-2010   BUNIONECTOMY Right 11/11/2013   Procedure: RIGHT FOOT SILVER BUNIONECTOMY;  Surgeon: Toni Arthurs, MD;  Location: [facility];  Service: Orthopedics;  Laterality: Right;   CARDIAC CATHETERIZATION  12/11/2001   significant 3V CAD, normal LV function   CARDIOVERSION N/A 03/13/2022   Procedure: CARDIOVERSION;  Surgeon: Little Ishikawa, MD;  Location: [facility] ENDOSCOPY;  Service: Cardiovascular;  [name];   CARDIOVERSION N/A 07/25/2022   Procedure: CARDIOVERSION;  Surgeon: Jake Bathe, MD;  [facility] CV LAB;  Service: Cardiovascular;  [name];   Carotid Doppler  04/27/2012   right internal carotid: Elevated velocities but no evidence of plaque. Left internal carotid 40-59%   CAROTID ENDARTERECTOMY  2005   Right; recent carotid Dopplers notes elevated velocities.    colonscopy     CORONARY ANGIOPLASTY  12/21/2001   PTCA of the distal and mid AV groove circ, unsuccessful PTCA of second OM total occlusion, unsuccessful PTCA of the apical LAD total occlusion   CORONARY ANGIOPLASTY WITH STENT PLACEMENT  04/29/2012   PCI to 3 RCA lesions, Promus Premiere 2.84mmx8mm distally, mid was 2.19mx28mm and proximally 2.75x69mm, EF 55-60%   CORONARY STENT PLACEMENT  04/28/2012   PCI to LAD (3x37mm Xience DES postdilated to 3.25) and circ prox and mid (2  overlappinmg 2.69mmx12mmXience DES postdilated to 2.97mm)   DOPPLER ECHOCARDIOGRAPHY  04/28/2012   poor quality study: EF estimated 60-65%; unable to assess diastolic function (previously noted to have diastolic dysfunction); severely dilated left atrium and mild right atrium; dilated IVC consistent with increased central venous pressure.Marland Kitchen   HERNIA REPAIR     I & D EXTREMITY Right 01/02/2023   Procedure: IRRIGATION AND DEBRIDEMENT RIGHT ANKLE;  Surgeon: [name], MD;  Location: [facility] OR;  Service: Orthopedics;  Laterality: Right;   LEFT AND RIGHT HEART CATHETERIZATION WITH CORONARY ANGIOGRAM N/A 04/27/2012   Procedure: LEFT AND RIGHT HEART CATHETERIZATION WITH CORONARY ANGIOGRAM;  Surgeon: Marykay Lex, MD;  Location: [facility] CATH LAB;  Service: Cardiovascular;  [name];   LEFT AND RIGHT HEART CATHETERIZATION WITH CORONARY ANGIOGRAM N/A 05/14/2013   Procedure: LEFT AND RIGHT HEART CATHETERIZATION WITH CORONARY ANGIOGRAM;  Surgeon: Lennette Bihari, MD;  [name]; PCWP ;; [name], [name], Patent RCA ostial mid and distal stents - 70% distal runoff Dz   LUMBAR LAMINECTOMY/DECOMPRESSION MICRODISCECTOMY  01/07/2012   Procedure: LUMBAR LAMINECTOMY/DECOMPRESSION MICRODISCECTOMY 1 LEVEL;  Surgeon: Carmela Hurt, MD;  [name];  Service: Neurosurgery;  Laterality: Bilateral;   Lumbar Three-Four Decompression   LUMBAR LAMINECTOMY/DECOMPRESSION MICRODISCECTOMY N/A 07/26/2020   Procedure: Lumbar two-three Laminectomy/Foraminotomy;  Surgeon: Coletta Memos, MD;  [name];  Service: Neurosurgery;  [name];   ORIF ANKLE FRACTURE Right 01/02/2023   Procedure: OPEN REDUCTION INTERNAL FIXATION (ORIF)RIGHT ANKLE FRACTURE;  Surgeon: [name], MD;  [name];  Service: Orthopedics;  Laterality: Right;   PERCUTANEOUS  CORONARY STENT INTERVENTION (PCI-S) N/A 04/28/2012   Procedure: PERCUTANEOUS CORONARY STENT INTERVENTION (PCI-S);  Surgeon: Lennette Bihari, MD;  Location: [facility] CATH LAB;  Service: Cardiovascular;  [name];   PERCUTANEOUS CORONARY STENT INTERVENTION (PCI-S) N/A 04/29/2012   Procedure: PERCUTANEOUS CORONARY STENT INTERVENTION (PCI-S);  Surgeon: Marykay Lex, MD;  Location: [facility] CATH LAB;  Service: Cardiovascular;  [name];   PERIPHERAL VASCULAR ATHERECTOMY  11/06/2022   Procedure: PERIPHERAL VASCULAR ATHERECTOMY;  Surgeon: Iran Ouch, MD;  [facility] CV LAB;  Service: Cardiovascular;;   PERIPHERAL VASCULAR BALLOON ANGIOPLASTY Right 10/13/2019   Procedure: PERIPHERAL VASCULAR BALLOON ANGIOPLASTY;  Surgeon: Iran Ouch, MD;  [facility]VAS[facility] CV LAB;  Service: Cardiovascular;  Laterality: Right;  PTCA of R TP Trunk into PTA (Drug-coated) => Follow-up ABIs 0.9 on the right and 0.99 on the left.   SPINE SURGERY     UMBILICAL HERNIA REPAIR  2009   steel mesh insert    MEDICATIONS: No current facility-administered medications for this encounter.    acetaminophen (TYLENOL) 500 MG tablet   allopurinol (ZYLOPRIM) 100 MG tablet   amiodarone (PACERONE) 200 MG tablet   amLODipine (NORVASC) 10 MG tablet   apixaban (ELIQUIS) 5 MG TABS tablet   ARIPiprazole (ABILIFY) 5 MG tablet   B-D ULTRAFINE III SHORT PEN 31G X 8 MM MISC   carvedilol (COREG) 3.125 MG tablet   Cholecalciferol (VITAMIN D) 125 MCG (5000 UT) CAPS   Continuous Blood Gluc Sensor (FREESTYLE LIBRE 14 DAY SENSOR) MISC   DULoxetine (CYMBALTA) 60 MG capsule   ezetimibe (ZETIA) 10 MG tablet   fluticasone (FLONASE) 50 MCG/ACT nasal spray   furosemide (LASIX) 80 MG tablet   gabapentin (NEURONTIN) 300 MG capsule   insulin aspart (NOVOLOG) 100 UNIT/ML injection   insulin glargine-yfgn (SEMGLEE) 100 UNIT/ML injection   levothyroxine (SYNTHROID, LEVOTHROID) 88 MCG tablet   loratadine (CLARITIN) 10 MG  tablet   Multiple Vitamin (MULTIVITAMIN WITH MINERALS) TABS tablet   mupirocin ointment (BACTROBAN) 2 %   nitroGLYCERIN (NITROSTAT) 0.4 MG SL tablet   oxyCODONE (OXY IR/ROXICODONE) 5 MG immediate release tablet   pantoprazole (PROTONIX) 40 MG  tablet   PRESCRIPTION MEDICATION   rosuvastatin (CRESTOR) 20 MG tablet   tirzepatide [facility]) 15 MG/0.5ML Pen    Shonna Chock, PA-C Surgical Short Stay/Anesthesiology [facility] Phone [phone] [facility] Phone [phone] 01/27/2023 7:00 PM

## 2023-01-28 ENCOUNTER — Encounter (HOSPITAL_COMMUNITY)
Admission: RE | Disposition: A | Discharge status: Skilled Nursing Facility | Payer: Self-pay | Source: Home / Self Care | Attending: Orthopedic Surgery

## 2023-01-28 ENCOUNTER — Ambulatory Visit (HOSPITAL_BASED_OUTPATIENT_CLINIC_OR_DEPARTMENT_OTHER): Payer: Medicare Other | Admitting: Physician Assistant

## 2023-01-28 ENCOUNTER — Ambulatory Visit (HOSPITAL_COMMUNITY): Payer: Medicare Other

## 2023-01-28 ENCOUNTER — Ambulatory Visit (HOSPITAL_COMMUNITY)
Admission: RE | Admit: 2023-01-28 | Discharge: 2023-01-28 | Disposition: A | Discharge status: Skilled Nursing Facility | Payer: Medicare Other | Attending: Orthopedic Surgery | Admitting: Orthopedic Surgery

## 2023-01-28 ENCOUNTER — Encounter (HOSPITAL_COMMUNITY): Payer: Self-pay | Admitting: Orthopedic Surgery

## 2023-01-28 ENCOUNTER — Other Ambulatory Visit: Payer: Self-pay

## 2023-01-28 ENCOUNTER — Ambulatory Visit (HOSPITAL_COMMUNITY): Payer: Medicare Other | Admitting: Physician Assistant

## 2023-01-28 DIAGNOSIS — E1122 Type 2 diabetes mellitus with diabetic chronic kidney disease: Secondary | ICD-10-CM | POA: Diagnosis not present

## 2023-01-28 DIAGNOSIS — I2510 Atherosclerotic heart disease of native coronary artery without angina pectoris: Secondary | ICD-10-CM | POA: Insufficient documentation

## 2023-01-28 DIAGNOSIS — Z87891 Personal history of nicotine dependence: Secondary | ICD-10-CM | POA: Insufficient documentation

## 2023-01-28 DIAGNOSIS — G4733 Obstructive sleep apnea (adult) (pediatric): Secondary | ICD-10-CM | POA: Insufficient documentation

## 2023-01-28 DIAGNOSIS — J449 Chronic obstructive pulmonary disease, unspecified: Secondary | ICD-10-CM | POA: Diagnosis not present

## 2023-01-28 DIAGNOSIS — Z7901 Long term (current) use of anticoagulants: Secondary | ICD-10-CM | POA: Diagnosis not present

## 2023-01-28 DIAGNOSIS — T84418A Breakdown (mechanical) of other internal orthopedic devices, implants and grafts, initial encounter: Secondary | ICD-10-CM | POA: Diagnosis not present

## 2023-01-28 DIAGNOSIS — S82401D Unspecified fracture of shaft of right fibula, subsequent encounter for closed fracture with routine healing: Secondary | ICD-10-CM | POA: Diagnosis not present

## 2023-01-28 DIAGNOSIS — N1832 Chronic kidney disease, stage 3b: Secondary | ICD-10-CM | POA: Diagnosis not present

## 2023-01-28 DIAGNOSIS — T84116A Breakdown (mechanical) of internal fixation device of bone of right lower leg, initial encounter: Secondary | ICD-10-CM | POA: Diagnosis not present

## 2023-01-28 DIAGNOSIS — S82301D Unspecified fracture of lower end of right tibia, subsequent encounter for closed fracture with routine healing: Secondary | ICD-10-CM | POA: Diagnosis not present

## 2023-01-28 DIAGNOSIS — Y831 Surgical operation with implant of artificial internal device as the cause of abnormal reaction of the patient, or of later complication, without mention of misadventure at the time of the procedure: Secondary | ICD-10-CM | POA: Insufficient documentation

## 2023-01-28 DIAGNOSIS — Z955 Presence of coronary angioplasty implant and graft: Secondary | ICD-10-CM | POA: Diagnosis not present

## 2023-01-28 DIAGNOSIS — E119 Type 2 diabetes mellitus without complications: Secondary | ICD-10-CM | POA: Diagnosis not present

## 2023-01-28 DIAGNOSIS — Z4789 Encounter for other orthopedic aftercare: Secondary | ICD-10-CM | POA: Diagnosis not present

## 2023-01-28 DIAGNOSIS — I129 Hypertensive chronic kidney disease with stage 1 through stage 4 chronic kidney disease, or unspecified chronic kidney disease: Secondary | ICD-10-CM | POA: Diagnosis not present

## 2023-01-28 DIAGNOSIS — S93431A Sprain of tibiofibular ligament of right ankle, initial encounter: Secondary | ICD-10-CM | POA: Diagnosis not present

## 2023-01-28 DIAGNOSIS — S9304XA Dislocation of right ankle joint, initial encounter: Secondary | ICD-10-CM | POA: Diagnosis not present

## 2023-01-28 DIAGNOSIS — T84018A Broken internal joint prosthesis, other site, initial encounter: Secondary | ICD-10-CM | POA: Insufficient documentation

## 2023-01-28 DIAGNOSIS — I272 Pulmonary hypertension, unspecified: Secondary | ICD-10-CM | POA: Diagnosis not present

## 2023-01-28 DIAGNOSIS — I252 Old myocardial infarction: Secondary | ICD-10-CM | POA: Diagnosis not present

## 2023-01-28 DIAGNOSIS — S82851B Displaced trimalleolar fracture of right lower leg, initial encounter for open fracture type I or II: Secondary | ICD-10-CM | POA: Diagnosis not present

## 2023-01-28 DIAGNOSIS — I4891 Unspecified atrial fibrillation: Secondary | ICD-10-CM | POA: Diagnosis not present

## 2023-01-28 HISTORY — DX: Orthostatic hypotension: I95.1

## 2023-01-28 HISTORY — DX: Anemia, unspecified: D64.9

## 2023-01-28 HISTORY — PX: HARDWARE REMOVAL: SHX979

## 2023-01-28 HISTORY — DX: Vitamin D deficiency, unspecified: E55.9

## 2023-01-28 LAB — BASIC METABOLIC PANEL
Anion gap: 11 (ref 5–15)
BUN: 18 mg/dL (ref 8–23)
CO2: 24 mmol/L (ref 22–32)
Calcium: 8.7 mg/dL — ABNORMAL LOW (ref 8.9–10.3)
Chloride: 105 mmol/L (ref 98–111)
Creatinine, Ser: 2.13 mg/dL — ABNORMAL HIGH (ref 0.61–1.24)
GFR, Estimated: 33 mL/min — ABNORMAL LOW (ref >60)
Glucose, Bld: 147 mg/dL — ABNORMAL HIGH (ref 70–99)
Potassium: 3.4 mmol/L — ABNORMAL LOW (ref 3.5–5.1)
Sodium: 140 mmol/L (ref 135–145)

## 2023-01-28 LAB — CBC
HCT: 32.8 % — ABNORMAL LOW (ref 39.0–52.0)
Hemoglobin: 11.2 g/dL — ABNORMAL LOW (ref 13.0–17.0)
MCH: 30.9 pg (ref 26.0–34.0)
MCHC: 34.1 g/dL (ref 30.0–36.0)
MCV: 90.4 fL (ref 80.0–100.0)
Platelets: 157 10*3/uL (ref 150–400)
RBC: 3.63 MIL/uL — ABNORMAL LOW (ref 4.22–5.81)
RDW: 14 % (ref 11.5–15.5)
WBC: 5.4 10*3/uL (ref 4.0–10.5)
nRBC: 0 % (ref 0.0–0.2)

## 2023-01-28 LAB — GLUCOSE, CAPILLARY
Glucose-Capillary: 132 mg/dL — ABNORMAL HIGH (ref 70–99)
Glucose-Capillary: 118 mg/dL — ABNORMAL HIGH (ref 70–99)
Glucose-Capillary: 117 mg/dL — ABNORMAL HIGH (ref 70–99)

## 2023-01-28 SURGERY — REMOVAL, HARDWARE
Anesthesia: General | Site: Ankle | Laterality: Right

## 2023-01-28 MED ORDER — FENTANYL CITRATE (PF) 100 MCG/2ML IJ SOLN: 25.0000 ug | INTRAMUSCULAR | Status: DC | PRN

## 2023-01-28 MED ORDER — CHLORHEXIDINE GLUCONATE 4 % EX SOLN: 60.0000 mL | Freq: Once | CUTANEOUS | Status: DC

## 2023-01-28 MED ORDER — FENTANYL CITRATE (PF) 250 MCG/5ML IJ SOLN
INTRAMUSCULAR | Status: AC
  Filled 2023-01-28: qty 5

## 2023-01-28 MED ORDER — EPHEDRINE 5 MG/ML INJ
INTRAVENOUS | Status: AC
  Filled 2023-01-28: qty 5

## 2023-01-28 MED ORDER — SCOPOLAMINE 1 MG/3DAYS TD PT72
MEDICATED_PATCH | TRANSDERMAL | Status: AC
  Filled 2023-01-28: qty 1

## 2023-01-28 MED ORDER — SODIUM CHLORIDE 0.9 % IV SOLN
3.0000 g | INTRAVENOUS | Status: AC
  Administered 2023-01-28: 3 g via INTRAVENOUS
  Filled 2023-01-28: qty 3

## 2023-01-28 MED ORDER — SODIUM CHLORIDE 0.9 % IV SOLN: INTRAVENOUS | Status: DC

## 2023-01-28 MED ORDER — DEXAMETHASONE SODIUM PHOSPHATE 10 MG/ML IJ SOLN
INTRAMUSCULAR | Status: AC
  Filled 2023-01-28: qty 1

## 2023-01-28 MED ORDER — ORAL CARE MOUTH RINSE: 15.0000 mL | Freq: Once | OROMUCOSAL | Status: AC

## 2023-01-28 MED ORDER — ONDANSETRON HCL 4 MG/2ML IJ SOLN
INTRAMUSCULAR | Status: AC
  Filled 2023-01-28: qty 2

## 2023-01-28 MED ORDER — MIDAZOLAM HCL 2 MG/2ML IJ SOLN
INTRAMUSCULAR | Status: AC
  Filled 2023-01-28: qty 2

## 2023-01-28 MED ORDER — ROCURONIUM BROMIDE 10 MG/ML (PF) SYRINGE
PREFILLED_SYRINGE | INTRAVENOUS | Status: DC | PRN
  Administered 2023-01-28: 50 mg via INTRAVENOUS

## 2023-01-28 MED ORDER — PHENYLEPHRINE HCL-NACL 20-0.9 MG/250ML-% IV SOLN
INTRAVENOUS | Status: DC | PRN
  Administered 2023-01-28: 30 ug/min via INTRAVENOUS

## 2023-01-28 MED ORDER — 0.9 % SODIUM CHLORIDE (POUR BTL) OPTIME
TOPICAL | Status: DC | PRN
  Administered 2023-01-28: 1000 mL

## 2023-01-28 MED ORDER — CEFAZOLIN SODIUM-DEXTROSE 2-4 GM/100ML-% IV SOLN
INTRAVENOUS | Status: AC
  Filled 2023-01-28: qty 100

## 2023-01-28 MED ORDER — LIDOCAINE 2% (20 MG/ML) 5 ML SYRINGE
INTRAMUSCULAR | Status: DC | PRN
  Administered 2023-01-28: 100 mg via INTRAVENOUS

## 2023-01-28 MED ORDER — VANCOMYCIN HCL 1.5 G IV SOLR
1500.0000 mg | INTRAVENOUS | Status: AC
  Filled 2023-01-28: qty 30

## 2023-01-28 MED ORDER — SUGAMMADEX SODIUM 200 MG/2ML IV SOLN
INTRAVENOUS | Status: DC | PRN
  Administered 2023-01-28: 300 mg via INTRAVENOUS

## 2023-01-28 MED ORDER — PROPOFOL 10 MG/ML IV BOLUS
INTRAVENOUS | Status: AC
  Filled 2023-01-28: qty 20

## 2023-01-28 MED ORDER — VANCOMYCIN HCL 1.5 G IV SOLR
INTRAVENOUS | Status: AC
  Administered 2023-01-28: 1500 mg via INTRAVENOUS
  Filled 2023-01-28: qty 30

## 2023-01-28 MED ORDER — LIDOCAINE 2% (20 MG/ML) 5 ML SYRINGE
INTRAMUSCULAR | Status: AC
Start: 2023-01-28 — End: ?
  Filled 2023-01-28: qty 5

## 2023-01-28 MED ORDER — PHENYLEPHRINE 80 MCG/ML (10ML) SYRINGE FOR IV PUSH (FOR BLOOD PRESSURE SUPPORT)
PREFILLED_SYRINGE | INTRAVENOUS | Status: AC
Start: 2023-01-28 — End: ?
  Filled 2023-01-28: qty 10

## 2023-01-28 MED ORDER — CARVEDILOL 3.125 MG PO TABS
ORAL_TABLET | ORAL | Status: AC
  Administered 2023-01-28: 3.125 mg via ORAL
  Filled 2023-01-28: qty 1

## 2023-01-28 MED ORDER — ACETAMINOPHEN 500 MG PO TABS: 1000.0000 mg | ORAL_TABLET | Freq: Once | ORAL | Status: AC

## 2023-01-28 MED ORDER — CARVEDILOL 3.125 MG PO TABS
3.1250 mg | ORAL_TABLET | Freq: Once | ORAL | Status: AC
Start: 2023-01-28 — End: 2023-01-28

## 2023-01-28 MED ORDER — INSULIN ASPART 100 UNIT/ML IJ SOLN: 0.0000 [IU] | INTRAMUSCULAR | Status: DC | PRN

## 2023-01-28 MED ORDER — ONDANSETRON HCL 4 MG/2ML IJ SOLN
INTRAMUSCULAR | Status: DC | PRN
  Administered 2023-01-28: 4 mg via INTRAVENOUS

## 2023-01-28 MED ORDER — ROCURONIUM BROMIDE 10 MG/ML (PF) SYRINGE
PREFILLED_SYRINGE | INTRAVENOUS | Status: AC
  Filled 2023-01-28: qty 10

## 2023-01-28 MED ORDER — ACETAMINOPHEN 500 MG PO TABS
ORAL_TABLET | ORAL | Status: AC
  Administered 2023-01-28: 1000 mg via ORAL
  Filled 2023-01-28: qty 2

## 2023-01-28 MED ORDER — FENTANYL CITRATE (PF) 250 MCG/5ML IJ SOLN
INTRAMUSCULAR | Status: DC | PRN
  Administered 2023-01-28: 50 ug via INTRAVENOUS

## 2023-01-28 MED ORDER — CHLORHEXIDINE GLUCONATE 0.12 % MT SOLN
OROMUCOSAL | Status: AC
  Administered 2023-01-28: 15 mL via OROMUCOSAL
  Filled 2023-01-28: qty 15

## 2023-01-28 MED ORDER — SCOPOLAMINE 1 MG/3DAYS TD PT72
1.0000 | MEDICATED_PATCH | Freq: Once | TRANSDERMAL | Status: DC
  Administered 2023-01-28: 1.5 mg via TRANSDERMAL

## 2023-01-28 MED ORDER — EPHEDRINE SULFATE-NACL 50-0.9 MG/10ML-% IV SOSY
PREFILLED_SYRINGE | INTRAVENOUS | Status: DC | PRN
  Administered 2023-01-28 (×2): 10 mg via INTRAVENOUS

## 2023-01-28 MED ORDER — DEXAMETHASONE SODIUM PHOSPHATE 10 MG/ML IJ SOLN
INTRAMUSCULAR | Status: DC | PRN
  Administered 2023-01-28: 5 mg via INTRAVENOUS

## 2023-01-28 MED ORDER — PROPOFOL 10 MG/ML IV BOLUS
INTRAVENOUS | Status: DC | PRN
  Administered 2023-01-28: 200 mg via INTRAVENOUS

## 2023-01-28 MED ORDER — POVIDONE-IODINE 10 % EX SWAB
2.0000 | Freq: Once | CUTANEOUS | Status: AC
  Administered 2023-01-28: 2 via TOPICAL

## 2023-01-28 MED ORDER — PHENYLEPHRINE 80 MCG/ML (10ML) SYRINGE FOR IV PUSH (FOR BLOOD PRESSURE SUPPORT)
PREFILLED_SYRINGE | INTRAVENOUS | Status: DC | PRN
  Administered 2023-01-28: 80 ug via INTRAVENOUS
  Administered 2023-01-28: 160 ug via INTRAVENOUS
  Administered 2023-01-28: 80 ug via INTRAVENOUS
  Administered 2023-01-28: 160 ug via INTRAVENOUS

## 2023-01-28 MED ORDER — CHLORHEXIDINE GLUCONATE 0.12 % MT SOLN: 15.0000 mL | Freq: Once | OROMUCOSAL | Status: AC

## 2023-01-28 MED ORDER — MIDAZOLAM HCL 2 MG/2ML IJ SOLN
INTRAMUSCULAR | Status: DC | PRN
  Administered 2023-01-28: 1 mg via INTRAVENOUS

## 2023-01-28 SURGICAL SUPPLY — 55 items
BAG COUNTER SPONGE SURGICOUNT (BAG) ×1 IMPLANT
BANDAGE ESMARK 6X9 LF (GAUZE/BANDAGES/DRESSINGS) ×1 IMPLANT
BIT DRILL 4.5 TYB 9 (BIT) IMPLANT
BNDG COHESIVE 6X5 TAN ST LF (GAUZE/BANDAGES/DRESSINGS) ×1 IMPLANT
BNDG ELASTIC 4INX 5YD STR LF (GAUZE/BANDAGES/DRESSINGS) IMPLANT
BNDG ELASTIC 4X5.8 VLCR STR LF (GAUZE/BANDAGES/DRESSINGS) ×1 IMPLANT
BNDG ELASTIC 6X5.8 VLCR STR LF (GAUZE/BANDAGES/DRESSINGS) ×1 IMPLANT
BNDG ESMARK 6X9 LF (GAUZE/BANDAGES/DRESSINGS)
BNDG GAUZE DERMACEA FLUFF 4 (GAUZE/BANDAGES/DRESSINGS) ×2 IMPLANT
BRUSH SCRUB EZ PLAIN DRY (MISCELLANEOUS) ×2 IMPLANT
COVER SURGICAL LIGHT HANDLE (MISCELLANEOUS) ×2 IMPLANT
CUFF TOURN SGL QUICK 18X4 (TOURNIQUET CUFF) IMPLANT
CUFF TRNQT CYL 24X4X16.5-23 (TOURNIQUET CUFF) IMPLANT
CUFF TRNQT CYL 34X4.125X (TOURNIQUET CUFF) IMPLANT
DRAPE C-ARM 42X72 X-RAY (DRAPES) IMPLANT
DRAPE C-ARMOR (DRAPES) ×1 IMPLANT
DRAPE U-SHAPE 47X51 STRL (DRAPES) ×1 IMPLANT
DRSG ADAPTIC 3X8 NADH LF (GAUZE/BANDAGES/DRESSINGS) ×1 IMPLANT
ELECT REM PT RETURN 9FT ADLT (ELECTROSURGICAL) ×1
ELECTRODE REM PT RTRN 9FT ADLT (ELECTROSURGICAL) ×1 IMPLANT
GAUZE PAD ABD 8X10 STRL (GAUZE/BANDAGES/DRESSINGS) IMPLANT
GAUZE SPONGE 4X4 12PLY STRL (GAUZE/BANDAGES/DRESSINGS) ×1 IMPLANT
GLOVE BIO SURGEON STRL SZ7.5 (GLOVE) ×1 IMPLANT
GLOVE BIO SURGEON STRL SZ8 (GLOVE) ×1 IMPLANT
GLOVE BIOGEL PI IND STRL 7.5 (GLOVE) ×1 IMPLANT
GLOVE BIOGEL PI IND STRL 8 (GLOVE) ×1 IMPLANT
GLOVE SURG ORTHO LTX SZ7.5 (GLOVE) ×2 IMPLANT
GOWN STRL REUS W/ TWL LRG LVL3 (GOWN DISPOSABLE) ×2 IMPLANT
GOWN STRL REUS W/ TWL XL LVL3 (GOWN DISPOSABLE) ×1 IMPLANT
GUIDEWIRE TYB 2X9 (WIRE) IMPLANT
KIT BASIN OR (CUSTOM PROCEDURE TRAY) ×1 IMPLANT
KIT TURNOVER KIT B (KITS) ×1 IMPLANT
MANIFOLD NEPTUNE II (INSTRUMENTS) ×1 IMPLANT
NDL 22X1.5 STRL (OR ONLY) (MISCELLANEOUS) IMPLANT
NEEDLE 22X1.5 STRL (OR ONLY) (MISCELLANEOUS) IMPLANT
NS IRRIG 1000ML POUR BTL (IV SOLUTION) ×1 IMPLANT
PACK ORTHO EXTREMITY (CUSTOM PROCEDURE TRAY) ×1 IMPLANT
PAD ARMBOARD 7.5X6 YLW CONV (MISCELLANEOUS) ×2 IMPLANT
PADDING CAST COTTON 6X4 STRL (CAST SUPPLIES) ×3 IMPLANT
SPONGE T-LAP 18X18 ~~LOC~~+RFID (SPONGE) ×1 IMPLANT
STAPLER VISISTAT 35W (STAPLE) IMPLANT
STOCKINETTE IMPERVIOUS LG (DRAPES) ×1 IMPLANT
STRIP CLOSURE SKIN 1/2X4 (GAUZE/BANDAGES/DRESSINGS) IMPLANT
SUCTION TUBE FRAZIER 10FR DISP (SUCTIONS) IMPLANT
SUT ETHILON 2 0 FS 18 (SUTURE) IMPLANT
SUT PDS AB 2-0 CT1 27 (SUTURE) IMPLANT
SUT VIC AB 0 CT1 27XBRD ANBCTR (SUTURE) IMPLANT
SUT VIC AB 2-0 CT1 TAPERPNT 27 (SUTURE) IMPLANT
SYR CONTROL 10ML LL (SYRINGE) IMPLANT
TOWEL GREEN STERILE (TOWEL DISPOSABLE) ×2 IMPLANT
TOWEL GREEN STERILE FF (TOWEL DISPOSABLE) ×2 IMPLANT
TUBE CONNECTING 12X1/4 (SUCTIONS) ×1 IMPLANT
UNDERPAD 30X36 HEAVY ABSORB (UNDERPADS AND DIAPERS) ×1 IMPLANT
WATER STERILE IRR 1000ML POUR (IV SOLUTION) ×2 IMPLANT
YANKAUER SUCT BULB TIP NO VENT (SUCTIONS) ×1 IMPLANT

## 2023-01-28 NOTE — Anesthesia Procedure Notes (Addendum)
Procedure Name: Intubation Date/Time: 01/28/2023 11:57 AM  Performed by: Heron Sabins, CRNAPre-anesthesia Checklist: Patient identified, Emergency Drugs available, Suction available and Patient being monitored Patient Re-evaluated:Patient Re-evaluated prior to induction Oxygen Delivery Method: Circle System Utilized Preoxygenation: Pre-oxygenation with 100% oxygen Induction Type: IV induction Ventilation: Mask ventilation without difficulty Laryngoscope Size: Glidescope and 4 Grade View: Grade I Tube type: Oral Number of attempts: 1 Airway Equipment and Method: Stylet and Oral airway Placement Confirmation: ETT inserted through vocal cords under direct vision, positive ETCO2 and breath sounds checked- equal and bilateral Secured at: 24 cm Tube secured with: Tape Dental Injury: Teeth and Oropharynx as per pre-operative assessment

## 2023-01-28 NOTE — Discharge Instructions (Addendum)
[facility] Discharge Instructions   General Discharge Instructions   WEIGHT BEARING STATUS: Nonweightbearing right leg, boot is to remain on at all times.  Skin should be checked every shift.  You can remove boot 2-3 times a day to work on gentle ankle motion  RANGE OF MOTION/ACTIVITY: Gentle ankle motion with flexion extension is okay at this point.  When not working on motion boot is to be on.  Motion should be work several times a day.  Activity as tolerated while maintaining weightbearing restrictions  Bone health:  Review the following resource for additional information regarding bone health  [email or web address]  Wound Care: Daily wound care starting on 01/30/2023.  Please see below.  It is okay to bathe and clean wounds with soap and water.  Would also continue to use a compression sock or similar for swelling control   Discharge Wound Care Instructions  Do NOT apply any ointments, solutions or lotions to pin sites or surgical wounds.  These prevent needed drainage and even though solutions like hydrogen peroxide kill bacteria, they also damage cells lining the pin sites that help fight infection.  Applying lotions or ointments can keep the wounds moist and can cause them to breakdown and open up as well. This can increase the risk for infection. When in doubt call the office.  Surgical incisions should be dressed daily.  If any drainage is noted, use one layer of adaptic or Mepitel, then gauze, Kerlix, and an ace wrap.  [email or web address] [email or web address]  [email or web address]  These dressing supplies should be available at local medical supply stores ([employer], [employer], etc). They are not usually carried  at places like [facility], [facility], [facility], etc  Once the incision is completely dry and without drainage, it may be left open to air out.  Showering may begin 36-48 hours later.  Cleaning gently with soap and water.  Traumatic wounds should be dressed daily as well.    One layer of adaptic, gauze, Kerlix, then ace wrap.  The adaptic can be discontinued once the draining has ceased    If you have a wet to dry dressing: wet the gauze with saline the squeeze as much saline out so the gauze is moist (not soaking wet), place moistened gauze over wound, then place a dry gauze over the moist one, followed by Kerlix wrap, then ace wrap.  Diet: as you were eating previously.  Can use over the counter stool softeners and bowel preparations, such as Miralax, to help with bowel movements.  Narcotics can be constipating.  Be sure to drink plenty of fluids  PAIN MEDICATION USE AND EXPECTATIONS  You have likely been given narcotic medications to help control your pain.  After a traumatic event that results in an fracture (broken bone) with or without surgery, it is ok to use narcotic pain medications to help control one's pain.  We understand that everyone responds to pain differently and each individual patient will be evaluated on a regular basis for the continued need for narcotic medications. Ideally, narcotic medication use should last no more than 6-8 weeks (coinciding with fracture healing).   As a patient it is your responsibility as well to monitor narcotic medication use and report the amount and frequency you use these medications when you come to your office visit.   We would also advise that if you are using narcotic medications, you should take a dose prior to therapy to maximize you participation.  IF YOU ARE ON NARCOTIC MEDICATIONS  IT IS NOT PERMISSIBLE TO OPERATE A MOTOR VEHICLE (MOTORCYCLE/CAR/TRUCK/MOPED) OR HEAVY MACHINERY DO NOT MIX NARCOTICS WITH OTHER CNS (CENTRAL NERVOUS SYSTEM) DEPRESSANTS  SUCH AS ALCOHOL   POST-OPERATIVE OPIOID TAPER INSTRUCTIONS: It is important to wean off of your opioid medication as soon as possible. If you do not need pain medication after your surgery it is ok to stop day one. Opioids include: Codeine, Hydrocodone(Norco, Vicodin), Oxycodone(Percocet, oxycontin) and hydromorphone amongst others.  Long term and even short term use of opiods can cause: Increased pain response Dependence Constipation Depression Respiratory depression And more.  Withdrawal symptoms can include Flu like symptoms Nausea, vomiting And more Techniques to manage these symptoms Hydrate well Eat regular healthy meals Stay active Use relaxation techniques(deep breathing, meditating, yoga) Do Not substitute Alcohol to help with tapering If you have been on opioids for less than two weeks and do not have pain than it is ok to stop all together.  Plan to wean off of opioids This plan should start within one week post op of your fracture surgery  Maintain the same interval or time between taking each dose and first decrease the dose.  Cut the total daily intake of opioids by one tablet each day Next start to increase the time between doses. The last dose that should be eliminated is the evening dose.    STOP SMOKING OR USING NICOTINE PRODUCTS!!!!  As discussed nicotine severely impairs your body's ability to heal surgical and traumatic wounds but also impairs bone healing.  Wounds and bone heal by forming microscopic blood vessels (angiogenesis) and nicotine is a vasoconstrictor (essentially, shrinks blood vessels).  Therefore, if vasoconstriction occurs to these microscopic blood vessels they essentially disappear and are unable to deliver necessary nutrients to the healing tissue.  This is one modifiable factor that you can do to dramatically increase your chances of healing your injury.    (This means no smoking, no nicotine gum, patches, etc)  DO NOT USE NONSTEROIDAL  ANTI-INFLAMMATORY DRUGS (NSAID'S)  Using products such as Advil (ibuprofen), Aleve (naproxen), Motrin (ibuprofen) for additional pain control during fracture healing can delay and/or prevent the healing response.  If you would like to take over the counter (OTC) medication, Tylenol (acetaminophen) is ok.  However, some narcotic medications that are given for pain control contain acetaminophen as well. Therefore, you should not exceed more than 4000 mg of tylenol in a day if you do not have liver disease.  Also note that there are may OTC medicines, such as cold medicines and allergy medicines that my contain tylenol as well.  If you have any questions about medications and/or interactions please ask your doctor/PA or your pharmacist.      ICE AND ELEVATE INJURED/OPERATIVE EXTREMITY  Using ice and elevating the injured extremity above your heart can help with swelling and pain control.  Icing in a pulsatile fashion, such as 20 minutes on and 20 minutes off, can be followed.    Do not place ice directly on skin. Make sure there is a barrier between to skin and the ice pack.    Using frozen items such as frozen peas works well as the conform nicely to the are that needs to be iced.  USE AN ACE WRAP OR TED HOSE FOR SWELLING CONTROL  In addition to icing and elevation, Ace wraps or TED hose are used to help limit and resolve swelling.  It is recommended to use Ace wraps or TED hose until you are informed to stop.  When using Ace Wraps start the wrapping distally (farthest away from the body) and wrap proximally (closer to the body)   Example: If you had surgery on your leg or thing and you do not have a splint on, start the ace wrap at the toes and work your way up to the thigh        If you had surgery on your upper extremity and do not have a splint on, start the ace wrap at your fingers and work your way up to the upper arm  IF YOU ARE IN A SPLINT OR CAST DO NOT REMOVE IT FOR ANY REASON   If your  splint gets wet for any reason please contact the office immediately. You may shower in your splint or cast as long as you keep it dry.  This can be done by wrapping in a cast cover or garbage back (or similar)  Do Not stick any thing down your splint or cast such as pencils, money, or hangers to try and scratch yourself with.  If you feel itchy take benadryl as prescribed on the bottle for itching  IF YOU ARE IN A CAM BOOT (BLACK BOOT)  You may remove boot periodically. Perform daily dressing changes as noted below.  Wash the liner of the boot regularly and wear a sock when wearing the boot. It is recommended that you sleep in the boot until told otherwise    Call office for the following: Temperature greater than 101F Persistent nausea and vomiting Severe uncontrolled pain Redness, tenderness, or signs of infection (pain, swelling, redness, odor or green/yellow discharge around the site) Difficulty breathing, headache or visual disturbances Hives Persistent dizziness or light-headedness Extreme fatigue Any other questions or concerns you may have after discharge  In an emergency, call 911 or go to an Emergency Department at a nearby hospital  HELPFUL INFORMATION  If you had a block, it will wear off between 8-24 hrs postop typically.  This is period when your pain may go from nearly zero to the pain you would have had postop without the block.  This is an abrupt transition but nothing dangerous is happening.  You may take an extra dose of narcotic when this happens.  You should wean off your narcotic medicines as soon as you are able.  Most patients will be off or using minimal narcotics before their first postop appointment.   We suggest you use the pain medication the first night prior to going to bed, in order to ease any pain when the anesthesia wears off. You should avoid taking pain medications on an empty stomach as it will make you nauseous.  Do not drink alcoholic beverages or  take illicit drugs when taking pain medications.  In most states it is against the law to drive while you are in a splint or sling.  And certainly against the law to drive while taking narcotics.  You may return to work/school in the next couple of days when you feel up to it.   Pain medication may make you constipated.  Below are a few solutions to try in this order: Decrease the amount of pain medication if you aren't having pain. Drink lots of decaffeinated fluids. Drink prune juice and/or each dried prunes  If the first 3 don't work start with additional solutions Take Colace - an over-the-counter stool softener Take Senokot - an over-the-counter laxative Take Miralax - a stronger over-the-counter laxative     CALL THE OFFICE WITH ANY  QUESTIONS OR CONCERNS: [phone]   VISIT OUR WEBSITE FOR ADDITIONAL INFORMATION: [email or web address]

## 2023-01-28 NOTE — Progress Notes (Signed)
Patient in phase 2, fully recovered from anesthesia, awaiting [facility] transportation to arrive. Report called to Inetta Fermo, Charity fundraiser at [facility].

## 2023-01-28 NOTE — Progress Notes (Signed)
I spoke with Tiny from [facility] to review medications. Pt last took Eliquis at 8pm last night, Dr Carola Frost notified- ok to proceed.

## 2023-01-28 NOTE — H&P (Signed)
[facility] H&P  Patient ID: Harold [name] MRN: [id number] DOB/AGE: [birth year] 67 y.o.  Chief Complaint:  broken intraarticular hardware right ankle HPI: Harold [name] is an 67 y.o. 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 Seen for first time in the office for post op follow up with complete fracture of the  tibiotalarcalcaneal stabilizing pins.  These pins protrude out into the joint and one of them has broken off and has been pushed deep into the tissues from the outer skin and was not retrievable at the office. We have recommended removal in the OR and that access to the pins will be difficult and likely require two approaches.  Past Medical History:  Diagnosis Date   Anemia    Anginal pain (HCC) 04/2013   Cardiac cath showed patent stents with distal LAD, circumflex-OM and RCA disease in small vessels.   Anxiety    Atrial fibrillation (HCC) 12/2021   CAD S/P percutaneous coronary angioplasty 2003, 04/2012   status post PCI to LAD, circumflex-OM 2, RCA   Carotid artery occlusion    Chronic renal insufficiency, stage II (mild)    Chronic venous insufficiency    varicosities, no reflux; dopplers 04/14/12- valvular insufficiency in the R and L GSV   Complication of anesthesia    COPD, mild (HCC)    Depression    situaltional    Diabetes mellitus    Diabetic neuropathy (HCC) 08/24/2019   Dyslipidemia associated with type 2 diabetes mellitus (HCC)    GERD (gastroesophageal reflux disease)    History of cardioversion 12/2021   at [facility]   HTN (hypertension)    Hypothyroidism    Neuromuscular disorder (HCC)    neuropathy in feet   Neuropathy     notably improved following PCI with improved cardiac function   Obesity    Obesity, Class II, BMI 35.0-39.9, with comorbidity (see actual BMI)    BMI 39; wgt loss efforts in place; seeing Dietician   Orthostatic hypotension    PONV (postoperative nausea and vomiting)    "[employer]"   Pulmonary hypertension (HCC) 04/2012   PA pressure   Sleep apnea    uses nightly   Vitamin D deficiency    per facility notes    Past Surgical History:  Procedure Laterality Date   ABDOMINAL AORTAGRAM N/A 11/15/2011   Procedure: ABDOMINAL Ronny Flurry;  Surgeon: Sherren Kerns, MD;  Location: [facility] CATH LAB;  Service: Cardiovascular;  [name];   ABDOMINAL AORTOGRAM W/LOWER EXTREMITY N/A 10/13/2019   Procedure: ABDOMINAL AORTOGRAM W/LOWER EXTREMITY;  Surgeon: Iran Ouch, MD;  Location: [facility] INVASIVE CV LAB;  Service: CV: Non-obst Ao-Iliac. R SFA-PopA patent with significant calcific stenosis of TP trunk-prox PTA (dominant) & CTO ATA  -> R PTA-TP PTCA   ABDOMINAL AORTOGRAM W/LOWER EXTREMITY N/A 11/06/2022   Procedure: ABDOMINAL AORTOGRAM W/LOWER EXTREMITY;  Surgeon: Iran Ouch, MD;  Location: [facility] INVASIVE CV LAB;  Service: Cardiovascular;  [name];   ABIs  04/27/2012   mild bilateral arterial insufficiency   BACK SURGERY  2005 x1   X2-2010   BUNIONECTOMY Right 11/11/2013   Procedure: RIGHT FOOT SILVER BUNIONECTOMY;  Surgeon: Toni Arthurs, MD;  Location: [facility];  Service: Orthopedics;  Laterality: Right;   CARDIAC CATHETERIZATION  12/11/2001   significant 3V CAD, normal LV function   CARDIOVERSION N/A 03/13/2022   Procedure: CARDIOVERSION;  Surgeon: Little Ishikawa, MD;  Location: [facility] ENDOSCOPY;  Service: Cardiovascular;  [name];   CARDIOVERSION N/A 07/25/2022   Procedure: CARDIOVERSION;  Surgeon: Jake Bathe, MD;  Location: [facility] INVASIVE CV LAB;  Service: Cardiovascular;  [name];   Carotid Doppler  04/27/2012   right internal  carotid: Elevated velocities but no evidence of plaque. Left internal carotid 40-59%   CAROTID ENDARTERECTOMY  2005   Right; recent carotid Dopplers notes elevated velocities.    colonscopy     CORONARY ANGIOPLASTY  12/21/2001   PTCA of the distal and mid AV groove circ, unsuccessful PTCA of second OM total occlusion, unsuccessful PTCA of the apical LAD total occlusion   CORONARY ANGIOPLASTY WITH STENT PLACEMENT  04/29/2012   PCI to 3 RCA lesions, Promus Premiere 2.67mmx8mm distally, mid was 2.37mx28mm and proximally 2.75x56mm, EF 55-60%   CORONARY STENT PLACEMENT  04/28/2012   PCI to LAD (3x76mm Xience DES postdilated to 3.25) and circ prox and mid (2 overlappinmg 2.25mmx12mmXience DES postdilated to 2.21mm)   DOPPLER ECHOCARDIOGRAPHY  04/28/2012   poor quality study: EF estimated 60-65%; unable to assess diastolic function (previously noted to have diastolic dysfunction); severely dilated left atrium and mild right atrium; dilated IVC consistent with increased central venous pressure.Marland Kitchen   HERNIA REPAIR     I & D EXTREMITY Right 01/02/2023   Procedure: IRRIGATION AND DEBRIDEMENT RIGHT ANKLE;  Surgeon: [name], MD;  [name];  Service: Orthopedics;  Laterality: Right;   LEFT AND RIGHT HEART CATHETERIZATION WITH CORONARY ANGIOGRAM N/A 04/27/2012   Procedure: LEFT AND RIGHT HEART CATHETERIZATION WITH CORONARY ANGIOGRAM;  Surgeon: Marykay Lex, MD;  Location: [facility] CATH LAB;  Service: Cardiovascular;  [name];   LEFT AND RIGHT HEART CATHETERIZATION WITH CORONARY ANGIOGRAM N/A 05/14/2013   Procedure: LEFT AND RIGHT HEART CATHETERIZATION WITH CORONARY ANGIOGRAM;  Surgeon: Lennette Bihari, MD;  [name]; PCWP ;; [name], [name], Patent RCA ostial mid and distal stents - 70% distal runoff Dz   LUMBAR LAMINECTOMY/DECOMPRESSION  MICRODISCECTOMY  01/07/2012   Procedure: LUMBAR LAMINECTOMY/DECOMPRESSION MICRODISCECTOMY 1 LEVEL;  Surgeon: Carmela Hurt, MD;  [name];  Service: Neurosurgery;  Laterality: Bilateral;   Lumbar Three-Four Decompression   LUMBAR LAMINECTOMY/DECOMPRESSION MICRODISCECTOMY N/A 07/26/2020   Procedure: Lumbar two-three Laminectomy/Foraminotomy;  Surgeon: Coletta Memos, MD;  [name];  Service: Neurosurgery;  [name];   ORIF ANKLE FRACTURE Right 01/02/2023   Procedure: OPEN REDUCTION INTERNAL FIXATION (ORIF)RIGHT ANKLE FRACTURE;  Surgeon: [name], MD;  [name];  Service: Orthopedics;  Laterality: Right;   PERCUTANEOUS CORONARY STENT INTERVENTION (PCI-S) N/A 04/28/2012   Procedure: PERCUTANEOUS CORONARY STENT INTERVENTION (PCI-S);  Surgeon: Lennette Bihari, MD;  Location: [facility] CATH LAB;  Service: Cardiovascular;  [name];   PERCUTANEOUS CORONARY STENT INTERVENTION (PCI-S) N/A 04/29/2012   Procedure: PERCUTANEOUS CORONARY STENT INTERVENTION (PCI-S);  Surgeon: Marykay Lex, MD;  Location: [facility] CATH LAB;  Service: Cardiovascular;  [name];   PERIPHERAL VASCULAR ATHERECTOMY  11/06/2022   Procedure: PERIPHERAL VASCULAR ATHERECTOMY;  Surgeon: Iran Ouch, MD;  [facility] INVAS[facility] CV LAB;  Service: Cardiovascular;;   PERIPHERAL VASCULAR BALLOON ANGIOPLASTY Right  10/13/2019   Procedure: PERIPHERAL VASCULAR BALLOON ANGIOPLASTY;  Surgeon: Iran Ouch, MD;  [facility]: [facility] INVAS[facility] CV LAB;  Service: Cardiovascular;  Laterality: Right;  PTCA of R TP Trunk into PTA (Drug-coated) => Follow-up ABIs 0.9 on the right and 0.99 on the left.   SPINE SURGERY     UMBILICAL HERNIA REPAIR  2009   steel mesh insert    Family History  Adopted: Yes  Family history unknown: Yes   Social History:  reports that he quit smoking about 19 years ago. His smoking use included cigarettes. He started smoking about 61 years ago. He has a 42 pack-year smoking history.  He has never used smokeless tobacco. He reports that he does not drink alcohol and does not use drugs.  Allergies:  Allergies  Allergen Reactions   Septra [Bactrim] Itching   Penicillins Rash    Allergy to All cillin drugs  Ancef given 9/24 with no obvious reaction   Metformin And Related Diarrhea and Nausea Only   Other Nausea And Vomiting    General Anesthesia - sometimes causes nausea and vomiting * OK to use scopolamine patch     Sulfamethoxazole-Trimethoprim Other (See Comments)   Wellbutrin [Bupropion]     Mood Changes   Doxycycline Hives and Rash    Medications Prior to Admission  Medication Sig Dispense Refill   allopurinol (ZYLOPRIM) 100 MG tablet Take 1 tablet (100 mg total) by mouth daily.     amiodarone (PACERONE) 200 MG tablet Take 1 tablet (200 mg total) by mouth daily. 90 tablet 1   amLODipine (NORVASC) 10 MG tablet Take 1 tablet (10 mg total) by mouth daily.     apixaban (ELIQUIS) 5 MG TABS tablet Take 1 tablet (5 mg total) by mouth 2 (two) times daily. 28 tablet 0   ARIPiprazole (ABILIFY) 5 MG tablet Take 5 mg by mouth daily.     carvedilol (COREG) 3.125 MG tablet TAKE 1 TABLET BY MOUTH TWICE A DAY WITH A MEAL 180 tablet 3   Cholecalciferol (VITAMIN D) 125 MCG (5000 UT) CAPS Take 1 capsule by mouth daily. 30 capsule 6   DULoxetine (CYMBALTA) 60 MG capsule Take 60 mg by mouth daily.     ezetimibe (ZETIA) 10 MG tablet Take 10 mg by mouth daily.     fluticasone (FLONASE) 50 MCG/ACT nasal spray Place 2 sprays into both nostrils daily.     furosemide (LASIX) 80 MG tablet Take 0.5 tablets (40 mg total) by mouth daily. May take additional tablet for weight gain of 3 pounds in one day or five pounds in one week     gabapentin (NEURONTIN) 300 MG capsule Take 1 capsule (300 mg total) by mouth 2 (two) times daily.     insulin aspart (NOVOLOG) 100 UNIT/ML injection Inject 8 Units into the skin 3 (three) times daily with meals.     insulin glargine-yfgn (SEMGLEE) 100 UNIT/ML  injection Inject 0.4 mLs (40 Units total) into the skin daily.     levothyroxine (SYNTHROID, LEVOTHROID) 88 MCG tablet Take 88 mcg by mouth daily before breakfast.     loratadine (CLARITIN) 10 MG tablet Take 1 tablet (10 mg total) by mouth daily.     Multiple Vitamin (MULTIVITAMIN WITH MINERALS) TABS tablet Take 1 tablet by mouth daily.     oxyCODONE (OXY IR/ROXICODONE) 5 MG immediate release tablet Take 1 tablet (5 mg total) by mouth every 4 (four) hours as needed for moderate pain (pain score 4-6). 30 tablet 0  pantoprazole (PROTONIX) 40 MG tablet TAKE 1 TABLET BY MOUTH EVERY DAY 90 tablet 3   rosuvastatin (CRESTOR) 20 MG tablet Take 20 mg by mouth every evening.     acetaminophen (TYLENOL) 500 MG tablet Take 1 tablet (500 mg total) by mouth every 6 (six) hours as needed for mild pain (pain score 1-3).     B-D ULTRAFINE III SHORT PEN 31G X 8 MM MISC Inject 1 each into the skin 3 (three) times daily.     Continuous Blood Gluc Sensor (FREESTYLE LIBRE 14 DAY SENSOR) MISC APPLY 1 SENSOR EVERY 14 DAYS  11   mupirocin ointment (BACTROBAN) 2 % Apply 1 Application topically 2 (two) times daily as needed (wound care). 30 g 2   nitroGLYCERIN (NITROSTAT) 0.4 MG SL tablet PLACE 1 TAB UNDER THE TONGUE EVERY 5 MINUTES AS NEEDED FOR CHEST PAIN (SEVERE PRESSURE OR TIGHTNESS) 25 tablet 3   PRESCRIPTION MEDICATION Pt uses CPAP Machine at bedtime     tirzepatide (MOUNJARO) 15 MG/0.5ML Pen Inject 15 mg into the skin once a week. 6 mL 3   traZODone (DESYREL) 50 MG tablet Take 50 mg by mouth at bedtime as needed for sleep.      Results for orders placed or performed during the hospital encounter of 01/28/23 (from the past 48 hour(s))  Glucose, capillary     Status: Abnormal   Collection Time: 01/28/23  8:18 AM  Result Value Ref Range   Glucose-Capillary 132 (H) 70 - 99 mg/dL    Comment: Glucose reference range applies only to samples taken after fasting for at least 8 hours.  CBC     Status: Abnormal    Collection Time: 01/28/23  9:06 AM  Result Value Ref Range   WBC 5.4 4.0 - 10.5 K/uL   RBC 3.63 (L) 4.22 - 5.81 MIL/uL   Hemoglobin 11.2 (L) 13.0 - 17.0 g/dL   HCT 42.7 (L) 06.2 - 37.6 %   MCV 90.4 80.0 - 100.0 fL   MCH 30.9 26.0 - 34.0 pg   MCHC 34.1 30.0 - 36.0 g/dL   RDW 28.3 15.1 - 76.1 %   Platelets 157 150 - 400 K/uL   nRBC 0.0 0.0 - 0.2 %    Comment: Performed at [facility], [address], [place], [place] [address]  Basic metabolic panel     Status: Abnormal   Collection Time: 01/28/23  9:06 AM  Result Value Ref Range   Sodium 140 135 - 145 mmol/L   Potassium 3.4 (L) 3.5 - 5.1 mmol/L   Chloride 105 98 - 111 mmol/L   CO2 24 22 - 32 mmol/L   Glucose, Bld 147 (H) 70 - 99 mg/dL    Comment: Glucose reference range applies only to samples taken after fasting for at least 8 hours.   BUN 18 8 - 23 mg/dL   Creatinine, Ser 1.06 (H) 0.61 - 1.24 mg/dL   Calcium 8.7 (L) 8.9 - 10.3 mg/dL   GFR, Estimated 33 (L) >60 mL/min    Comment: (NOTE) Calculated using the CKD-EPI Creatinine Equation (2021)    Anion gap 11 5 - 15    Comment: Performed at [facility], [address] [place], [place] [address]  Glucose, capillary     Status: Abnormal   Collection Time: 01/28/23 10:20 AM  Result Value Ref Range   Glucose-Capillary 118 (H) 70 - 99 mg/dL    Comment: Glucose reference range applies only to samples taken after fasting  for at least 8 hours.   *Note: Due to a large number of results and/or encounters for the requested time period, some results have not been displayed. A complete set of results can be found in Results Review.   No results found.  ROS No recent fever, bleeding abnormalities, urologic dysfunction, GI problems, or weight gain.  Blood pressure (!) 149/61, pulse 62, temperature 98.1 F (36.7 C), resp. rate 18, height 6\' 1"  (1.854 m), weight (!) 142.9 kg, SpO2 98%. Physical Exam NCAT and very pleasant as always No audible wheezing RLE Wounds well  healed, sutures still in Dressing intact, clean, dry  Edema/ swelling controlled  Sens: DPN, SPN, TN intact  Motor: EHL, FHL, and lessor toe ext and flex all intact grossly but decreased motion because of pins  Brisk cap refill, warm to touch  Assessment/Plan  Broken intra-articular hardware right ankle Deep K wire right foot  The risks and benefits of hardware removal from the right ankle were discussed with the patient, including the possibility of infection, nerve injury, vessel injury, wound breakdown, arthritis, symptomatic hardware, DVT/ PE, loss of motion, malunion, nonunion, and need for further surgery among others.  These risks were acknowledged and consent was provided to proceed.    [name], MD [facility], [facility] [phone]  01/28/2023, 11:01 AM  [facility] [address] [place] [address] [phone] (F)

## 2023-01-28 NOTE — Transfer of Care (Signed)
Immediate Anesthesia Transfer of Care Note  Patient: Harold [name]  Procedure(s) Performed: REMOVAL OF HARDWARE RIGHT ANKLE (Right: Ankle)  Patient Location: PACU  Anesthesia Type:General  Level of Consciousness: awake and drowsy  Airway & Oxygen Therapy: Patient Spontanous Breathing and Patient connected to face mask oxygen  Post-op Assessment: Report given to RN and Post -op Vital signs reviewed and stable  Post vital signs: Reviewed and stable  Last Vitals:  Vitals Value Taken Time  BP 155/72 01/28/23 1310  Temp    Pulse 57 01/28/23 1313  Resp 15 01/28/23 1312  SpO2 92 % 01/28/23 1313  Vitals shown include unfiled device data.  Last Pain:  Vitals:   01/28/23 0849  PainSc: 5          Complications: No notable events documented.

## 2023-01-29 ENCOUNTER — Encounter (HOSPITAL_COMMUNITY): Payer: Self-pay | Admitting: Orthopedic Surgery

## 2023-01-29 NOTE — Anesthesia Postprocedure Evaluation (Signed)
Anesthesia Post Note  Patient: Harold [name]  Procedure(s) Performed: REMOVAL OF HARDWARE RIGHT ANKLE (Right: Ankle)     Patient location during evaluation: PACU Anesthesia Type: General Level of consciousness: awake and alert Pain management: pain level controlled Vital Signs Assessment: post-procedure vital signs reviewed and stable Respiratory status: spontaneous breathing, nonlabored ventilation, respiratory function stable and patient connected to nasal cannula oxygen Cardiovascular status: blood pressure returned to baseline and stable Postop Assessment: no apparent nausea or vomiting Anesthetic complications: no  No notable events documented.  Last Vitals:  Vitals:   01/28/23 1415 01/28/23 1430  BP: (!) 145/74 (!) 141/63  Pulse: 60 60  Resp: 18 19  Temp:  (!) 36.1 C  SpO2: 93% 95%    Last Pain:  Vitals:   01/28/23 1430  PainSc: 0-No pain   Pain Goal:                   [name] L Delmer Kowalski

## 2023-01-31 DIAGNOSIS — E876 Hypokalemia: Secondary | ICD-10-CM

## 2023-01-31 DIAGNOSIS — F3341 Major depressive disorder, recurrent, in partial remission: Secondary | ICD-10-CM

## 2023-01-31 DIAGNOSIS — M6281 Muscle weakness (generalized): Secondary | ICD-10-CM

## 2023-01-31 DIAGNOSIS — I480 Paroxysmal atrial fibrillation: Secondary | ICD-10-CM

## 2023-01-31 DIAGNOSIS — R2689 Other abnormalities of gait and mobility: Secondary | ICD-10-CM

## 2023-01-31 DIAGNOSIS — I272 Pulmonary hypertension, unspecified: Secondary | ICD-10-CM

## 2023-01-31 DIAGNOSIS — J449 Chronic obstructive pulmonary disease, unspecified: Secondary | ICD-10-CM

## 2023-02-02 NOTE — Op Note (Signed)
NAME: Harold [name], Harold [name] MEDICAL RECORD NO: [id number] ACCOUNT NO: [id number] DATE OF BIRTH: 01/12/1956 FACILITY: [facility] LOCATION: [facility] PHYSICIAN: Doralee Albino. Carola Frost, MD  Operative Report   DATE OF PROCEDURE: 01/28/2023  PREOPERATIVE DIAGNOSIS:  Broken intraarticular hardware right ankle, status post ORIF.  POSTOPERATIVE DIAGNOSIS:  Broken intraarticular hardware right ankle, status post ORIF.  PROCEDURES: 1.  Removal of broken implants, right tibiotalar joint and tibia. 2.  Application of manual stress under fluoroscopy right ankle and syndesmosis.  SURGEON:  [name], MD  ASSISTANT:  Montez Morita, PA-C.  SECOND ASSISTANT:  PA student.  COMPLICATIONS:  None.  SPECIMENS:  None.  PATIENT DISPOSITION:  To PACU.  CONDITION:  Stable.  BRIEF SUMMARY OF INDICATIONS FOR PROCEDURE:  The patient is a 67 year old patient who underwent ORIF of a complicated fracture-dislocation of the right ankle.  This was supplemented with tibiotalar calcaneal pinning using two 2.0 mm pins that were bent  off well outside the skin.  The patient is insensate and morbidly obese and did admit to noncompliance with weightbearing.  As a result of this, at his first followup both pins were found to be broken.  Only one of them was retrievable through the heel,  the other had been driven up underneath the skin.  I discussed with him the risks and benefits of removal including the possibility of having to take them out from above if they could not be taken out from the foot itself.  The patient acknowledges these  risks and did provide consent to proceed.  BRIEF SUMMARY OF PROCEDURE:  The patient was taken to the operating room where general anesthesia was induced.  The right lower extremity was prepped and draped in the usual sterile fashion.  No tourniquet was used during the procedure.  After timeout,  C-arm was brought in and the location of the pin driven underneath the skin identified and withdrawn.   I then placed a pin in this channel or hole and maneuvered it up to the broken portion that was sticking out into the joint.  I then used a cannulated  drill that would go over that 2 mm pin, advanced it up to the pin and then withdrew it after being able to work the drill over it.  In similar fashion, the second pin was retrieved by bringing the ankle up into the appropriate amount of flexion, finding the old pin track, placing a pin in this, advancing the cannulated drill over the pin and then to the distal portion of the broken  fragment.  This was also engaged with this 3.5 cannulated drill and the pin withdrawn.  The wound was irrigated thoroughly.  C-arm was then used to evaluate the syndesmosis and ankle dislocation.  Under live fluoroscopy, manual application of stress with external rotation and lateral translation was applied to the talus, I did not identify any subluxation either at the syndesmosis or the tibiotalar joint itself.  Loose closure with a single suture  was performed distally and then a sterile gently compressive dressing and CAM boot applied.  The patient was taken to the PACU in stable condition.  Montez Morita, PA-C and a PA student were both assisting me throughout.  No complications during the  procedure.  PROGNOSIS:  Fortunately, the patient has not disrupted his surgical repair with the plate and screws and we are hopeful that he can go on and unite without complication.  He remains strictly nonweightbearing until he reaches 8 weeks from his index  procedure back  on 01/02/2023.  Both the patient and his significant other have acknowledged this.  His success or failure will be likely directly related to his ability to comply with these restrictions and we remain hopeful for the best for this very  nice gentleman.    VAI D: 02/02/2023 7:45:49 pm T: 02/02/2023 8:26:00 pm  JOB: [id number]/ [id number]

## 2023-02-03 DIAGNOSIS — E039 Hypothyroidism, unspecified: Secondary | ICD-10-CM | POA: Diagnosis not present

## 2023-02-03 DIAGNOSIS — I272 Pulmonary hypertension, unspecified: Secondary | ICD-10-CM | POA: Diagnosis not present

## 2023-02-03 DIAGNOSIS — E785 Hyperlipidemia, unspecified: Secondary | ICD-10-CM | POA: Diagnosis not present

## 2023-02-03 DIAGNOSIS — N1832 Chronic kidney disease, stage 3b: Secondary | ICD-10-CM | POA: Diagnosis not present

## 2023-02-03 DIAGNOSIS — I951 Orthostatic hypotension: Secondary | ICD-10-CM | POA: Diagnosis not present

## 2023-02-03 DIAGNOSIS — E1122 Type 2 diabetes mellitus with diabetic chronic kidney disease: Secondary | ICD-10-CM | POA: Diagnosis not present

## 2023-02-03 DIAGNOSIS — I2510 Atherosclerotic heart disease of native coronary artery without angina pectoris: Secondary | ICD-10-CM | POA: Diagnosis not present

## 2023-02-03 DIAGNOSIS — R296 Repeated falls: Secondary | ICD-10-CM | POA: Diagnosis not present

## 2023-02-03 DIAGNOSIS — I4891 Unspecified atrial fibrillation: Secondary | ICD-10-CM | POA: Diagnosis not present

## 2023-02-03 DIAGNOSIS — D631 Anemia in chronic kidney disease: Secondary | ICD-10-CM | POA: Diagnosis not present

## 2023-02-03 DIAGNOSIS — I872 Venous insufficiency (chronic) (peripheral): Secondary | ICD-10-CM | POA: Diagnosis not present

## 2023-02-03 DIAGNOSIS — S81801D Unspecified open wound, right lower leg, subsequent encounter: Secondary | ICD-10-CM | POA: Diagnosis not present

## 2023-02-03 DIAGNOSIS — J449 Chronic obstructive pulmonary disease, unspecified: Secondary | ICD-10-CM | POA: Diagnosis not present

## 2023-02-03 DIAGNOSIS — T84116D Breakdown (mechanical) of internal fixation device of bone of right lower leg, subsequent encounter: Secondary | ICD-10-CM | POA: Diagnosis not present

## 2023-02-03 DIAGNOSIS — E1169 Type 2 diabetes mellitus with other specified complication: Secondary | ICD-10-CM | POA: Diagnosis not present

## 2023-02-03 DIAGNOSIS — G4733 Obstructive sleep apnea (adult) (pediatric): Secondary | ICD-10-CM | POA: Diagnosis not present

## 2023-02-03 DIAGNOSIS — E1142 Type 2 diabetes mellitus with diabetic polyneuropathy: Secondary | ICD-10-CM | POA: Diagnosis not present

## 2023-02-03 DIAGNOSIS — I129 Hypertensive chronic kidney disease with stage 1 through stage 4 chronic kidney disease, or unspecified chronic kidney disease: Secondary | ICD-10-CM | POA: Diagnosis not present

## 2023-02-03 DIAGNOSIS — M109 Gout, unspecified: Secondary | ICD-10-CM | POA: Diagnosis not present

## 2023-02-03 DIAGNOSIS — E1151 Type 2 diabetes mellitus with diabetic peripheral angiopathy without gangrene: Secondary | ICD-10-CM | POA: Diagnosis not present

## 2023-02-03 DIAGNOSIS — Z794 Long term (current) use of insulin: Secondary | ICD-10-CM | POA: Diagnosis not present

## 2023-02-06 ENCOUNTER — Ambulatory Visit (HOSPITAL_COMMUNITY): Admission: RE | Admit: 2023-02-06 | Payer: Medicare Other | Source: Ambulatory Visit

## 2023-02-06 DIAGNOSIS — I2510 Atherosclerotic heart disease of native coronary artery without angina pectoris: Secondary | ICD-10-CM | POA: Diagnosis not present

## 2023-02-06 DIAGNOSIS — E1169 Type 2 diabetes mellitus with other specified complication: Secondary | ICD-10-CM | POA: Diagnosis not present

## 2023-02-06 DIAGNOSIS — D631 Anemia in chronic kidney disease: Secondary | ICD-10-CM | POA: Diagnosis not present

## 2023-02-06 DIAGNOSIS — E785 Hyperlipidemia, unspecified: Secondary | ICD-10-CM | POA: Diagnosis not present

## 2023-02-06 DIAGNOSIS — I951 Orthostatic hypotension: Secondary | ICD-10-CM | POA: Diagnosis not present

## 2023-02-06 DIAGNOSIS — G4733 Obstructive sleep apnea (adult) (pediatric): Secondary | ICD-10-CM | POA: Diagnosis not present

## 2023-02-06 DIAGNOSIS — I4891 Unspecified atrial fibrillation: Secondary | ICD-10-CM | POA: Diagnosis not present

## 2023-02-06 DIAGNOSIS — N1832 Chronic kidney disease, stage 3b: Secondary | ICD-10-CM | POA: Diagnosis not present

## 2023-02-06 DIAGNOSIS — E1151 Type 2 diabetes mellitus with diabetic peripheral angiopathy without gangrene: Secondary | ICD-10-CM | POA: Diagnosis not present

## 2023-02-06 DIAGNOSIS — E1122 Type 2 diabetes mellitus with diabetic chronic kidney disease: Secondary | ICD-10-CM | POA: Diagnosis not present

## 2023-02-06 DIAGNOSIS — Z794 Long term (current) use of insulin: Secondary | ICD-10-CM | POA: Diagnosis not present

## 2023-02-06 DIAGNOSIS — I872 Venous insufficiency (chronic) (peripheral): Secondary | ICD-10-CM | POA: Diagnosis not present

## 2023-02-06 DIAGNOSIS — M109 Gout, unspecified: Secondary | ICD-10-CM | POA: Diagnosis not present

## 2023-02-06 DIAGNOSIS — E039 Hypothyroidism, unspecified: Secondary | ICD-10-CM | POA: Diagnosis not present

## 2023-02-06 DIAGNOSIS — I272 Pulmonary hypertension, unspecified: Secondary | ICD-10-CM | POA: Diagnosis not present

## 2023-02-06 DIAGNOSIS — E1142 Type 2 diabetes mellitus with diabetic polyneuropathy: Secondary | ICD-10-CM | POA: Diagnosis not present

## 2023-02-06 DIAGNOSIS — I129 Hypertensive chronic kidney disease with stage 1 through stage 4 chronic kidney disease, or unspecified chronic kidney disease: Secondary | ICD-10-CM | POA: Diagnosis not present

## 2023-02-06 DIAGNOSIS — J449 Chronic obstructive pulmonary disease, unspecified: Secondary | ICD-10-CM | POA: Diagnosis not present

## 2023-02-06 DIAGNOSIS — T84116D Breakdown (mechanical) of internal fixation device of bone of right lower leg, subsequent encounter: Secondary | ICD-10-CM | POA: Diagnosis not present

## 2023-02-06 DIAGNOSIS — R296 Repeated falls: Secondary | ICD-10-CM | POA: Diagnosis not present

## 2023-02-06 DIAGNOSIS — S81801D Unspecified open wound, right lower leg, subsequent encounter: Secondary | ICD-10-CM | POA: Diagnosis not present

## 2023-02-09 DIAGNOSIS — I951 Orthostatic hypotension: Secondary | ICD-10-CM | POA: Diagnosis not present

## 2023-02-09 DIAGNOSIS — R296 Repeated falls: Secondary | ICD-10-CM | POA: Diagnosis not present

## 2023-02-09 DIAGNOSIS — T84116D Breakdown (mechanical) of internal fixation device of bone of right lower leg, subsequent encounter: Secondary | ICD-10-CM | POA: Diagnosis not present

## 2023-02-09 DIAGNOSIS — E1122 Type 2 diabetes mellitus with diabetic chronic kidney disease: Secondary | ICD-10-CM | POA: Diagnosis not present

## 2023-02-09 DIAGNOSIS — I2510 Atherosclerotic heart disease of native coronary artery without angina pectoris: Secondary | ICD-10-CM | POA: Diagnosis not present

## 2023-02-09 DIAGNOSIS — E1169 Type 2 diabetes mellitus with other specified complication: Secondary | ICD-10-CM | POA: Diagnosis not present

## 2023-02-09 DIAGNOSIS — S81801D Unspecified open wound, right lower leg, subsequent encounter: Secondary | ICD-10-CM | POA: Diagnosis not present

## 2023-02-09 DIAGNOSIS — I4891 Unspecified atrial fibrillation: Secondary | ICD-10-CM | POA: Diagnosis not present

## 2023-02-09 DIAGNOSIS — E039 Hypothyroidism, unspecified: Secondary | ICD-10-CM | POA: Diagnosis not present

## 2023-02-09 DIAGNOSIS — D631 Anemia in chronic kidney disease: Secondary | ICD-10-CM | POA: Diagnosis not present

## 2023-02-09 DIAGNOSIS — N1832 Chronic kidney disease, stage 3b: Secondary | ICD-10-CM | POA: Diagnosis not present

## 2023-02-09 DIAGNOSIS — I272 Pulmonary hypertension, unspecified: Secondary | ICD-10-CM | POA: Diagnosis not present

## 2023-02-09 DIAGNOSIS — J449 Chronic obstructive pulmonary disease, unspecified: Secondary | ICD-10-CM | POA: Diagnosis not present

## 2023-02-09 DIAGNOSIS — E785 Hyperlipidemia, unspecified: Secondary | ICD-10-CM | POA: Diagnosis not present

## 2023-02-09 DIAGNOSIS — I129 Hypertensive chronic kidney disease with stage 1 through stage 4 chronic kidney disease, or unspecified chronic kidney disease: Secondary | ICD-10-CM | POA: Diagnosis not present

## 2023-02-09 DIAGNOSIS — M109 Gout, unspecified: Secondary | ICD-10-CM | POA: Diagnosis not present

## 2023-02-09 DIAGNOSIS — I872 Venous insufficiency (chronic) (peripheral): Secondary | ICD-10-CM | POA: Diagnosis not present

## 2023-02-09 DIAGNOSIS — E1151 Type 2 diabetes mellitus with diabetic peripheral angiopathy without gangrene: Secondary | ICD-10-CM | POA: Diagnosis not present

## 2023-02-09 DIAGNOSIS — G4733 Obstructive sleep apnea (adult) (pediatric): Secondary | ICD-10-CM | POA: Diagnosis not present

## 2023-02-09 DIAGNOSIS — E1142 Type 2 diabetes mellitus with diabetic polyneuropathy: Secondary | ICD-10-CM | POA: Diagnosis not present

## 2023-02-09 DIAGNOSIS — Z794 Long term (current) use of insulin: Secondary | ICD-10-CM | POA: Diagnosis not present

## 2023-02-10 DIAGNOSIS — S81801D Unspecified open wound, right lower leg, subsequent encounter: Secondary | ICD-10-CM | POA: Diagnosis not present

## 2023-02-10 DIAGNOSIS — M109 Gout, unspecified: Secondary | ICD-10-CM | POA: Diagnosis not present

## 2023-02-10 DIAGNOSIS — I4891 Unspecified atrial fibrillation: Secondary | ICD-10-CM | POA: Diagnosis not present

## 2023-02-10 DIAGNOSIS — E1151 Type 2 diabetes mellitus with diabetic peripheral angiopathy without gangrene: Secondary | ICD-10-CM | POA: Diagnosis not present

## 2023-02-10 DIAGNOSIS — T84116D Breakdown (mechanical) of internal fixation device of bone of right lower leg, subsequent encounter: Secondary | ICD-10-CM | POA: Diagnosis not present

## 2023-02-10 DIAGNOSIS — I951 Orthostatic hypotension: Secondary | ICD-10-CM | POA: Diagnosis not present

## 2023-02-10 DIAGNOSIS — E1169 Type 2 diabetes mellitus with other specified complication: Secondary | ICD-10-CM | POA: Diagnosis not present

## 2023-02-10 DIAGNOSIS — Z794 Long term (current) use of insulin: Secondary | ICD-10-CM | POA: Diagnosis not present

## 2023-02-10 DIAGNOSIS — E1142 Type 2 diabetes mellitus with diabetic polyneuropathy: Secondary | ICD-10-CM | POA: Diagnosis not present

## 2023-02-10 DIAGNOSIS — E1122 Type 2 diabetes mellitus with diabetic chronic kidney disease: Secondary | ICD-10-CM | POA: Diagnosis not present

## 2023-02-10 DIAGNOSIS — I129 Hypertensive chronic kidney disease with stage 1 through stage 4 chronic kidney disease, or unspecified chronic kidney disease: Secondary | ICD-10-CM | POA: Diagnosis not present

## 2023-02-10 DIAGNOSIS — I272 Pulmonary hypertension, unspecified: Secondary | ICD-10-CM | POA: Diagnosis not present

## 2023-02-10 DIAGNOSIS — E785 Hyperlipidemia, unspecified: Secondary | ICD-10-CM | POA: Diagnosis not present

## 2023-02-10 DIAGNOSIS — E039 Hypothyroidism, unspecified: Secondary | ICD-10-CM | POA: Diagnosis not present

## 2023-02-10 DIAGNOSIS — J449 Chronic obstructive pulmonary disease, unspecified: Secondary | ICD-10-CM | POA: Diagnosis not present

## 2023-02-10 DIAGNOSIS — R296 Repeated falls: Secondary | ICD-10-CM | POA: Diagnosis not present

## 2023-02-10 DIAGNOSIS — G4733 Obstructive sleep apnea (adult) (pediatric): Secondary | ICD-10-CM | POA: Diagnosis not present

## 2023-02-10 DIAGNOSIS — N1832 Chronic kidney disease, stage 3b: Secondary | ICD-10-CM | POA: Diagnosis not present

## 2023-02-10 DIAGNOSIS — I2510 Atherosclerotic heart disease of native coronary artery without angina pectoris: Secondary | ICD-10-CM | POA: Diagnosis not present

## 2023-02-10 DIAGNOSIS — I872 Venous insufficiency (chronic) (peripheral): Secondary | ICD-10-CM | POA: Diagnosis not present

## 2023-02-10 DIAGNOSIS — D631 Anemia in chronic kidney disease: Secondary | ICD-10-CM | POA: Diagnosis not present

## 2023-02-13 ENCOUNTER — Other Ambulatory Visit (HOSPITAL_COMMUNITY): Payer: Self-pay

## 2023-02-14 DIAGNOSIS — R296 Repeated falls: Secondary | ICD-10-CM | POA: Diagnosis not present

## 2023-02-14 DIAGNOSIS — S81801D Unspecified open wound, right lower leg, subsequent encounter: Secondary | ICD-10-CM | POA: Diagnosis not present

## 2023-02-14 DIAGNOSIS — I872 Venous insufficiency (chronic) (peripheral): Secondary | ICD-10-CM | POA: Diagnosis not present

## 2023-02-14 DIAGNOSIS — I129 Hypertensive chronic kidney disease with stage 1 through stage 4 chronic kidney disease, or unspecified chronic kidney disease: Secondary | ICD-10-CM | POA: Diagnosis not present

## 2023-02-14 DIAGNOSIS — E039 Hypothyroidism, unspecified: Secondary | ICD-10-CM | POA: Diagnosis not present

## 2023-02-14 DIAGNOSIS — G4733 Obstructive sleep apnea (adult) (pediatric): Secondary | ICD-10-CM | POA: Diagnosis not present

## 2023-02-14 DIAGNOSIS — N1832 Chronic kidney disease, stage 3b: Secondary | ICD-10-CM | POA: Diagnosis not present

## 2023-02-14 DIAGNOSIS — E1142 Type 2 diabetes mellitus with diabetic polyneuropathy: Secondary | ICD-10-CM | POA: Diagnosis not present

## 2023-02-14 DIAGNOSIS — Z794 Long term (current) use of insulin: Secondary | ICD-10-CM | POA: Diagnosis not present

## 2023-02-14 DIAGNOSIS — I4891 Unspecified atrial fibrillation: Secondary | ICD-10-CM | POA: Diagnosis not present

## 2023-02-14 DIAGNOSIS — D631 Anemia in chronic kidney disease: Secondary | ICD-10-CM | POA: Diagnosis not present

## 2023-02-14 DIAGNOSIS — I2510 Atherosclerotic heart disease of native coronary artery without angina pectoris: Secondary | ICD-10-CM | POA: Diagnosis not present

## 2023-02-14 DIAGNOSIS — E1169 Type 2 diabetes mellitus with other specified complication: Secondary | ICD-10-CM | POA: Diagnosis not present

## 2023-02-14 DIAGNOSIS — I951 Orthostatic hypotension: Secondary | ICD-10-CM | POA: Diagnosis not present

## 2023-02-14 DIAGNOSIS — E785 Hyperlipidemia, unspecified: Secondary | ICD-10-CM | POA: Diagnosis not present

## 2023-02-14 DIAGNOSIS — T84116D Breakdown (mechanical) of internal fixation device of bone of right lower leg, subsequent encounter: Secondary | ICD-10-CM | POA: Diagnosis not present

## 2023-02-14 DIAGNOSIS — I272 Pulmonary hypertension, unspecified: Secondary | ICD-10-CM | POA: Diagnosis not present

## 2023-02-14 DIAGNOSIS — M109 Gout, unspecified: Secondary | ICD-10-CM | POA: Diagnosis not present

## 2023-02-14 DIAGNOSIS — J449 Chronic obstructive pulmonary disease, unspecified: Secondary | ICD-10-CM | POA: Diagnosis not present

## 2023-02-14 DIAGNOSIS — E1151 Type 2 diabetes mellitus with diabetic peripheral angiopathy without gangrene: Secondary | ICD-10-CM | POA: Diagnosis not present

## 2023-02-14 DIAGNOSIS — E1122 Type 2 diabetes mellitus with diabetic chronic kidney disease: Secondary | ICD-10-CM | POA: Diagnosis not present

## 2023-02-17 ENCOUNTER — Other Ambulatory Visit: Payer: Self-pay | Admitting: Cardiology

## 2023-02-17 DIAGNOSIS — R296 Repeated falls: Secondary | ICD-10-CM | POA: Diagnosis not present

## 2023-02-17 DIAGNOSIS — E1151 Type 2 diabetes mellitus with diabetic peripheral angiopathy without gangrene: Secondary | ICD-10-CM | POA: Diagnosis not present

## 2023-02-17 DIAGNOSIS — I951 Orthostatic hypotension: Secondary | ICD-10-CM | POA: Diagnosis not present

## 2023-02-17 DIAGNOSIS — J449 Chronic obstructive pulmonary disease, unspecified: Secondary | ICD-10-CM | POA: Diagnosis not present

## 2023-02-17 DIAGNOSIS — G4733 Obstructive sleep apnea (adult) (pediatric): Secondary | ICD-10-CM | POA: Diagnosis not present

## 2023-02-17 DIAGNOSIS — E039 Hypothyroidism, unspecified: Secondary | ICD-10-CM | POA: Diagnosis not present

## 2023-02-17 DIAGNOSIS — T84116D Breakdown (mechanical) of internal fixation device of bone of right lower leg, subsequent encounter: Secondary | ICD-10-CM | POA: Diagnosis not present

## 2023-02-17 DIAGNOSIS — E1169 Type 2 diabetes mellitus with other specified complication: Secondary | ICD-10-CM | POA: Diagnosis not present

## 2023-02-17 DIAGNOSIS — M109 Gout, unspecified: Secondary | ICD-10-CM | POA: Diagnosis not present

## 2023-02-17 DIAGNOSIS — S82851E Displaced trimalleolar fracture of right lower leg, subsequent encounter for open fracture type I or II with routine healing: Secondary | ICD-10-CM | POA: Diagnosis not present

## 2023-02-17 DIAGNOSIS — I872 Venous insufficiency (chronic) (peripheral): Secondary | ICD-10-CM | POA: Diagnosis not present

## 2023-02-17 DIAGNOSIS — I272 Pulmonary hypertension, unspecified: Secondary | ICD-10-CM | POA: Diagnosis not present

## 2023-02-17 DIAGNOSIS — I4891 Unspecified atrial fibrillation: Secondary | ICD-10-CM | POA: Diagnosis not present

## 2023-02-17 DIAGNOSIS — Z794 Long term (current) use of insulin: Secondary | ICD-10-CM | POA: Diagnosis not present

## 2023-02-17 DIAGNOSIS — D631 Anemia in chronic kidney disease: Secondary | ICD-10-CM | POA: Diagnosis not present

## 2023-02-17 DIAGNOSIS — I129 Hypertensive chronic kidney disease with stage 1 through stage 4 chronic kidney disease, or unspecified chronic kidney disease: Secondary | ICD-10-CM | POA: Diagnosis not present

## 2023-02-17 DIAGNOSIS — E1122 Type 2 diabetes mellitus with diabetic chronic kidney disease: Secondary | ICD-10-CM | POA: Diagnosis not present

## 2023-02-17 DIAGNOSIS — I2510 Atherosclerotic heart disease of native coronary artery without angina pectoris: Secondary | ICD-10-CM | POA: Diagnosis not present

## 2023-02-17 DIAGNOSIS — S82891D Other fracture of right lower leg, subsequent encounter for closed fracture with routine healing: Secondary | ICD-10-CM | POA: Diagnosis not present

## 2023-02-17 DIAGNOSIS — N1832 Chronic kidney disease, stage 3b: Secondary | ICD-10-CM | POA: Diagnosis not present

## 2023-02-17 DIAGNOSIS — E785 Hyperlipidemia, unspecified: Secondary | ICD-10-CM | POA: Diagnosis not present

## 2023-02-17 DIAGNOSIS — S81801D Unspecified open wound, right lower leg, subsequent encounter: Secondary | ICD-10-CM | POA: Diagnosis not present

## 2023-02-17 DIAGNOSIS — E1142 Type 2 diabetes mellitus with diabetic polyneuropathy: Secondary | ICD-10-CM | POA: Diagnosis not present

## 2023-02-17 NOTE — Telephone Encounter (Signed)
Pt last saw Harold [name], [name] on 12/17/22, last labs 01/28/23 Creat 2.13, age 67, weight 142.9kg, based on specified criteria pt is on appropriate dosage of Eliquis 5mg  BID for afib.  Will refill rx.

## 2023-02-18 DIAGNOSIS — I4891 Unspecified atrial fibrillation: Secondary | ICD-10-CM | POA: Diagnosis not present

## 2023-02-18 DIAGNOSIS — I129 Hypertensive chronic kidney disease with stage 1 through stage 4 chronic kidney disease, or unspecified chronic kidney disease: Secondary | ICD-10-CM | POA: Diagnosis not present

## 2023-02-18 DIAGNOSIS — I872 Venous insufficiency (chronic) (peripheral): Secondary | ICD-10-CM | POA: Diagnosis not present

## 2023-02-18 DIAGNOSIS — I951 Orthostatic hypotension: Secondary | ICD-10-CM | POA: Diagnosis not present

## 2023-02-18 DIAGNOSIS — E1122 Type 2 diabetes mellitus with diabetic chronic kidney disease: Secondary | ICD-10-CM | POA: Diagnosis not present

## 2023-02-18 DIAGNOSIS — J449 Chronic obstructive pulmonary disease, unspecified: Secondary | ICD-10-CM | POA: Diagnosis not present

## 2023-02-18 DIAGNOSIS — E1142 Type 2 diabetes mellitus with diabetic polyneuropathy: Secondary | ICD-10-CM | POA: Diagnosis not present

## 2023-02-18 DIAGNOSIS — E785 Hyperlipidemia, unspecified: Secondary | ICD-10-CM | POA: Diagnosis not present

## 2023-02-18 DIAGNOSIS — I272 Pulmonary hypertension, unspecified: Secondary | ICD-10-CM | POA: Diagnosis not present

## 2023-02-18 DIAGNOSIS — N1832 Chronic kidney disease, stage 3b: Secondary | ICD-10-CM | POA: Diagnosis not present

## 2023-02-18 DIAGNOSIS — G4733 Obstructive sleep apnea (adult) (pediatric): Secondary | ICD-10-CM | POA: Diagnosis not present

## 2023-02-18 DIAGNOSIS — D631 Anemia in chronic kidney disease: Secondary | ICD-10-CM | POA: Diagnosis not present

## 2023-02-18 DIAGNOSIS — T84116D Breakdown (mechanical) of internal fixation device of bone of right lower leg, subsequent encounter: Secondary | ICD-10-CM | POA: Diagnosis not present

## 2023-02-18 DIAGNOSIS — E1151 Type 2 diabetes mellitus with diabetic peripheral angiopathy without gangrene: Secondary | ICD-10-CM | POA: Diagnosis not present

## 2023-02-18 DIAGNOSIS — M109 Gout, unspecified: Secondary | ICD-10-CM | POA: Diagnosis not present

## 2023-02-18 DIAGNOSIS — E1169 Type 2 diabetes mellitus with other specified complication: Secondary | ICD-10-CM | POA: Diagnosis not present

## 2023-02-18 DIAGNOSIS — Z794 Long term (current) use of insulin: Secondary | ICD-10-CM | POA: Diagnosis not present

## 2023-02-18 DIAGNOSIS — S81801D Unspecified open wound, right lower leg, subsequent encounter: Secondary | ICD-10-CM | POA: Diagnosis not present

## 2023-02-18 DIAGNOSIS — E039 Hypothyroidism, unspecified: Secondary | ICD-10-CM | POA: Diagnosis not present

## 2023-02-18 DIAGNOSIS — R296 Repeated falls: Secondary | ICD-10-CM | POA: Diagnosis not present

## 2023-02-18 DIAGNOSIS — I2510 Atherosclerotic heart disease of native coronary artery without angina pectoris: Secondary | ICD-10-CM | POA: Diagnosis not present

## 2023-02-21 DIAGNOSIS — Z794 Long term (current) use of insulin: Secondary | ICD-10-CM | POA: Diagnosis not present

## 2023-02-21 DIAGNOSIS — E039 Hypothyroidism, unspecified: Secondary | ICD-10-CM | POA: Diagnosis not present

## 2023-02-21 DIAGNOSIS — T84116D Breakdown (mechanical) of internal fixation device of bone of right lower leg, subsequent encounter: Secondary | ICD-10-CM | POA: Diagnosis not present

## 2023-02-21 DIAGNOSIS — E1122 Type 2 diabetes mellitus with diabetic chronic kidney disease: Secondary | ICD-10-CM | POA: Diagnosis not present

## 2023-02-21 DIAGNOSIS — E1151 Type 2 diabetes mellitus with diabetic peripheral angiopathy without gangrene: Secondary | ICD-10-CM | POA: Diagnosis not present

## 2023-02-21 DIAGNOSIS — I2510 Atherosclerotic heart disease of native coronary artery without angina pectoris: Secondary | ICD-10-CM | POA: Diagnosis not present

## 2023-02-21 DIAGNOSIS — D631 Anemia in chronic kidney disease: Secondary | ICD-10-CM | POA: Diagnosis not present

## 2023-02-21 DIAGNOSIS — J449 Chronic obstructive pulmonary disease, unspecified: Secondary | ICD-10-CM | POA: Diagnosis not present

## 2023-02-21 DIAGNOSIS — G4733 Obstructive sleep apnea (adult) (pediatric): Secondary | ICD-10-CM | POA: Diagnosis not present

## 2023-02-21 DIAGNOSIS — I872 Venous insufficiency (chronic) (peripheral): Secondary | ICD-10-CM | POA: Diagnosis not present

## 2023-02-21 DIAGNOSIS — I129 Hypertensive chronic kidney disease with stage 1 through stage 4 chronic kidney disease, or unspecified chronic kidney disease: Secondary | ICD-10-CM | POA: Diagnosis not present

## 2023-02-21 DIAGNOSIS — E1169 Type 2 diabetes mellitus with other specified complication: Secondary | ICD-10-CM | POA: Diagnosis not present

## 2023-02-21 DIAGNOSIS — E785 Hyperlipidemia, unspecified: Secondary | ICD-10-CM | POA: Diagnosis not present

## 2023-02-21 DIAGNOSIS — R296 Repeated falls: Secondary | ICD-10-CM | POA: Diagnosis not present

## 2023-02-21 DIAGNOSIS — E1142 Type 2 diabetes mellitus with diabetic polyneuropathy: Secondary | ICD-10-CM | POA: Diagnosis not present

## 2023-02-21 DIAGNOSIS — I272 Pulmonary hypertension, unspecified: Secondary | ICD-10-CM | POA: Diagnosis not present

## 2023-02-21 DIAGNOSIS — I951 Orthostatic hypotension: Secondary | ICD-10-CM | POA: Diagnosis not present

## 2023-02-21 DIAGNOSIS — M109 Gout, unspecified: Secondary | ICD-10-CM | POA: Diagnosis not present

## 2023-02-21 DIAGNOSIS — S81801D Unspecified open wound, right lower leg, subsequent encounter: Secondary | ICD-10-CM | POA: Diagnosis not present

## 2023-02-21 DIAGNOSIS — I4891 Unspecified atrial fibrillation: Secondary | ICD-10-CM | POA: Diagnosis not present

## 2023-02-21 DIAGNOSIS — N1832 Chronic kidney disease, stage 3b: Secondary | ICD-10-CM | POA: Diagnosis not present

## 2023-02-24 DIAGNOSIS — I2510 Atherosclerotic heart disease of native coronary artery without angina pectoris: Secondary | ICD-10-CM | POA: Diagnosis not present

## 2023-02-24 DIAGNOSIS — S81801D Unspecified open wound, right lower leg, subsequent encounter: Secondary | ICD-10-CM | POA: Diagnosis not present

## 2023-02-24 DIAGNOSIS — M109 Gout, unspecified: Secondary | ICD-10-CM | POA: Diagnosis not present

## 2023-02-24 DIAGNOSIS — I272 Pulmonary hypertension, unspecified: Secondary | ICD-10-CM | POA: Diagnosis not present

## 2023-02-24 DIAGNOSIS — E1142 Type 2 diabetes mellitus with diabetic polyneuropathy: Secondary | ICD-10-CM | POA: Diagnosis not present

## 2023-02-24 DIAGNOSIS — J449 Chronic obstructive pulmonary disease, unspecified: Secondary | ICD-10-CM | POA: Diagnosis not present

## 2023-02-24 DIAGNOSIS — Z794 Long term (current) use of insulin: Secondary | ICD-10-CM | POA: Diagnosis not present

## 2023-02-24 DIAGNOSIS — E1151 Type 2 diabetes mellitus with diabetic peripheral angiopathy without gangrene: Secondary | ICD-10-CM | POA: Diagnosis not present

## 2023-02-24 DIAGNOSIS — I4891 Unspecified atrial fibrillation: Secondary | ICD-10-CM | POA: Diagnosis not present

## 2023-02-24 DIAGNOSIS — T84116D Breakdown (mechanical) of internal fixation device of bone of right lower leg, subsequent encounter: Secondary | ICD-10-CM | POA: Diagnosis not present

## 2023-02-24 DIAGNOSIS — D631 Anemia in chronic kidney disease: Secondary | ICD-10-CM | POA: Diagnosis not present

## 2023-02-24 DIAGNOSIS — R296 Repeated falls: Secondary | ICD-10-CM | POA: Diagnosis not present

## 2023-02-24 DIAGNOSIS — I951 Orthostatic hypotension: Secondary | ICD-10-CM | POA: Diagnosis not present

## 2023-02-24 DIAGNOSIS — E785 Hyperlipidemia, unspecified: Secondary | ICD-10-CM | POA: Diagnosis not present

## 2023-02-24 DIAGNOSIS — E1169 Type 2 diabetes mellitus with other specified complication: Secondary | ICD-10-CM | POA: Diagnosis not present

## 2023-02-24 DIAGNOSIS — E1122 Type 2 diabetes mellitus with diabetic chronic kidney disease: Secondary | ICD-10-CM | POA: Diagnosis not present

## 2023-02-24 DIAGNOSIS — E039 Hypothyroidism, unspecified: Secondary | ICD-10-CM | POA: Diagnosis not present

## 2023-02-24 DIAGNOSIS — I129 Hypertensive chronic kidney disease with stage 1 through stage 4 chronic kidney disease, or unspecified chronic kidney disease: Secondary | ICD-10-CM | POA: Diagnosis not present

## 2023-02-24 DIAGNOSIS — I872 Venous insufficiency (chronic) (peripheral): Secondary | ICD-10-CM | POA: Diagnosis not present

## 2023-02-24 DIAGNOSIS — N1832 Chronic kidney disease, stage 3b: Secondary | ICD-10-CM | POA: Diagnosis not present

## 2023-02-24 DIAGNOSIS — G4733 Obstructive sleep apnea (adult) (pediatric): Secondary | ICD-10-CM | POA: Diagnosis not present

## 2023-02-25 ENCOUNTER — Ambulatory Visit: Payer: Medicare Other | Admitting: Podiatry

## 2023-02-25 DIAGNOSIS — I4891 Unspecified atrial fibrillation: Secondary | ICD-10-CM | POA: Diagnosis not present

## 2023-02-25 DIAGNOSIS — E039 Hypothyroidism, unspecified: Secondary | ICD-10-CM | POA: Diagnosis not present

## 2023-02-25 DIAGNOSIS — E1151 Type 2 diabetes mellitus with diabetic peripheral angiopathy without gangrene: Secondary | ICD-10-CM | POA: Diagnosis not present

## 2023-02-25 DIAGNOSIS — I872 Venous insufficiency (chronic) (peripheral): Secondary | ICD-10-CM | POA: Diagnosis not present

## 2023-02-25 DIAGNOSIS — S81801D Unspecified open wound, right lower leg, subsequent encounter: Secondary | ICD-10-CM | POA: Diagnosis not present

## 2023-02-25 DIAGNOSIS — T84116D Breakdown (mechanical) of internal fixation device of bone of right lower leg, subsequent encounter: Secondary | ICD-10-CM | POA: Diagnosis not present

## 2023-02-25 DIAGNOSIS — N1832 Chronic kidney disease, stage 3b: Secondary | ICD-10-CM | POA: Diagnosis not present

## 2023-02-25 DIAGNOSIS — E1169 Type 2 diabetes mellitus with other specified complication: Secondary | ICD-10-CM | POA: Diagnosis not present

## 2023-02-25 DIAGNOSIS — G4733 Obstructive sleep apnea (adult) (pediatric): Secondary | ICD-10-CM | POA: Diagnosis not present

## 2023-02-25 DIAGNOSIS — E1122 Type 2 diabetes mellitus with diabetic chronic kidney disease: Secondary | ICD-10-CM | POA: Diagnosis not present

## 2023-02-25 DIAGNOSIS — M109 Gout, unspecified: Secondary | ICD-10-CM | POA: Diagnosis not present

## 2023-02-25 DIAGNOSIS — R296 Repeated falls: Secondary | ICD-10-CM | POA: Diagnosis not present

## 2023-02-25 DIAGNOSIS — I129 Hypertensive chronic kidney disease with stage 1 through stage 4 chronic kidney disease, or unspecified chronic kidney disease: Secondary | ICD-10-CM | POA: Diagnosis not present

## 2023-02-25 DIAGNOSIS — I951 Orthostatic hypotension: Secondary | ICD-10-CM | POA: Diagnosis not present

## 2023-02-25 DIAGNOSIS — D631 Anemia in chronic kidney disease: Secondary | ICD-10-CM | POA: Diagnosis not present

## 2023-02-25 DIAGNOSIS — Z794 Long term (current) use of insulin: Secondary | ICD-10-CM | POA: Diagnosis not present

## 2023-02-25 DIAGNOSIS — I272 Pulmonary hypertension, unspecified: Secondary | ICD-10-CM | POA: Diagnosis not present

## 2023-02-25 DIAGNOSIS — E785 Hyperlipidemia, unspecified: Secondary | ICD-10-CM | POA: Diagnosis not present

## 2023-02-25 DIAGNOSIS — I2510 Atherosclerotic heart disease of native coronary artery without angina pectoris: Secondary | ICD-10-CM | POA: Diagnosis not present

## 2023-02-25 DIAGNOSIS — J449 Chronic obstructive pulmonary disease, unspecified: Secondary | ICD-10-CM | POA: Diagnosis not present

## 2023-02-25 DIAGNOSIS — E1142 Type 2 diabetes mellitus with diabetic polyneuropathy: Secondary | ICD-10-CM | POA: Diagnosis not present

## 2023-02-26 DIAGNOSIS — T84116D Breakdown (mechanical) of internal fixation device of bone of right lower leg, subsequent encounter: Secondary | ICD-10-CM | POA: Diagnosis not present

## 2023-02-26 DIAGNOSIS — E039 Hypothyroidism, unspecified: Secondary | ICD-10-CM | POA: Diagnosis not present

## 2023-02-26 DIAGNOSIS — J449 Chronic obstructive pulmonary disease, unspecified: Secondary | ICD-10-CM | POA: Diagnosis not present

## 2023-02-26 DIAGNOSIS — Z794 Long term (current) use of insulin: Secondary | ICD-10-CM | POA: Diagnosis not present

## 2023-02-26 DIAGNOSIS — R296 Repeated falls: Secondary | ICD-10-CM | POA: Diagnosis not present

## 2023-02-26 DIAGNOSIS — I951 Orthostatic hypotension: Secondary | ICD-10-CM | POA: Diagnosis not present

## 2023-02-26 DIAGNOSIS — E1169 Type 2 diabetes mellitus with other specified complication: Secondary | ICD-10-CM | POA: Diagnosis not present

## 2023-02-26 DIAGNOSIS — E1122 Type 2 diabetes mellitus with diabetic chronic kidney disease: Secondary | ICD-10-CM | POA: Diagnosis not present

## 2023-02-26 DIAGNOSIS — M109 Gout, unspecified: Secondary | ICD-10-CM | POA: Diagnosis not present

## 2023-02-26 DIAGNOSIS — S81801D Unspecified open wound, right lower leg, subsequent encounter: Secondary | ICD-10-CM | POA: Diagnosis not present

## 2023-02-26 DIAGNOSIS — I872 Venous insufficiency (chronic) (peripheral): Secondary | ICD-10-CM | POA: Diagnosis not present

## 2023-02-26 DIAGNOSIS — I129 Hypertensive chronic kidney disease with stage 1 through stage 4 chronic kidney disease, or unspecified chronic kidney disease: Secondary | ICD-10-CM | POA: Diagnosis not present

## 2023-02-26 DIAGNOSIS — I272 Pulmonary hypertension, unspecified: Secondary | ICD-10-CM | POA: Diagnosis not present

## 2023-02-26 DIAGNOSIS — D631 Anemia in chronic kidney disease: Secondary | ICD-10-CM | POA: Diagnosis not present

## 2023-02-26 DIAGNOSIS — E785 Hyperlipidemia, unspecified: Secondary | ICD-10-CM | POA: Diagnosis not present

## 2023-02-26 DIAGNOSIS — E1142 Type 2 diabetes mellitus with diabetic polyneuropathy: Secondary | ICD-10-CM | POA: Diagnosis not present

## 2023-02-26 DIAGNOSIS — I2510 Atherosclerotic heart disease of native coronary artery without angina pectoris: Secondary | ICD-10-CM | POA: Diagnosis not present

## 2023-02-26 DIAGNOSIS — G4733 Obstructive sleep apnea (adult) (pediatric): Secondary | ICD-10-CM | POA: Diagnosis not present

## 2023-02-26 DIAGNOSIS — N1832 Chronic kidney disease, stage 3b: Secondary | ICD-10-CM | POA: Diagnosis not present

## 2023-02-26 DIAGNOSIS — I4891 Unspecified atrial fibrillation: Secondary | ICD-10-CM | POA: Diagnosis not present

## 2023-02-26 DIAGNOSIS — E1151 Type 2 diabetes mellitus with diabetic peripheral angiopathy without gangrene: Secondary | ICD-10-CM | POA: Diagnosis not present

## 2023-02-28 DIAGNOSIS — E1169 Type 2 diabetes mellitus with other specified complication: Secondary | ICD-10-CM | POA: Diagnosis not present

## 2023-02-28 DIAGNOSIS — T84116D Breakdown (mechanical) of internal fixation device of bone of right lower leg, subsequent encounter: Secondary | ICD-10-CM | POA: Diagnosis not present

## 2023-02-28 DIAGNOSIS — E039 Hypothyroidism, unspecified: Secondary | ICD-10-CM | POA: Diagnosis not present

## 2023-02-28 DIAGNOSIS — D631 Anemia in chronic kidney disease: Secondary | ICD-10-CM | POA: Diagnosis not present

## 2023-02-28 DIAGNOSIS — N1832 Chronic kidney disease, stage 3b: Secondary | ICD-10-CM | POA: Diagnosis not present

## 2023-02-28 DIAGNOSIS — I2510 Atherosclerotic heart disease of native coronary artery without angina pectoris: Secondary | ICD-10-CM | POA: Diagnosis not present

## 2023-02-28 DIAGNOSIS — Z794 Long term (current) use of insulin: Secondary | ICD-10-CM | POA: Diagnosis not present

## 2023-02-28 DIAGNOSIS — R296 Repeated falls: Secondary | ICD-10-CM | POA: Diagnosis not present

## 2023-02-28 DIAGNOSIS — E1151 Type 2 diabetes mellitus with diabetic peripheral angiopathy without gangrene: Secondary | ICD-10-CM | POA: Diagnosis not present

## 2023-02-28 DIAGNOSIS — E1122 Type 2 diabetes mellitus with diabetic chronic kidney disease: Secondary | ICD-10-CM | POA: Diagnosis not present

## 2023-02-28 DIAGNOSIS — G4733 Obstructive sleep apnea (adult) (pediatric): Secondary | ICD-10-CM | POA: Diagnosis not present

## 2023-02-28 DIAGNOSIS — J449 Chronic obstructive pulmonary disease, unspecified: Secondary | ICD-10-CM | POA: Diagnosis not present

## 2023-02-28 DIAGNOSIS — E1142 Type 2 diabetes mellitus with diabetic polyneuropathy: Secondary | ICD-10-CM | POA: Diagnosis not present

## 2023-02-28 DIAGNOSIS — M109 Gout, unspecified: Secondary | ICD-10-CM | POA: Diagnosis not present

## 2023-02-28 DIAGNOSIS — I872 Venous insufficiency (chronic) (peripheral): Secondary | ICD-10-CM | POA: Diagnosis not present

## 2023-02-28 DIAGNOSIS — I4891 Unspecified atrial fibrillation: Secondary | ICD-10-CM | POA: Diagnosis not present

## 2023-02-28 DIAGNOSIS — I272 Pulmonary hypertension, unspecified: Secondary | ICD-10-CM | POA: Diagnosis not present

## 2023-02-28 DIAGNOSIS — I951 Orthostatic hypotension: Secondary | ICD-10-CM | POA: Diagnosis not present

## 2023-02-28 DIAGNOSIS — S81801D Unspecified open wound, right lower leg, subsequent encounter: Secondary | ICD-10-CM | POA: Diagnosis not present

## 2023-02-28 DIAGNOSIS — I129 Hypertensive chronic kidney disease with stage 1 through stage 4 chronic kidney disease, or unspecified chronic kidney disease: Secondary | ICD-10-CM | POA: Diagnosis not present

## 2023-02-28 DIAGNOSIS — E785 Hyperlipidemia, unspecified: Secondary | ICD-10-CM | POA: Diagnosis not present

## 2023-03-03 DIAGNOSIS — I272 Pulmonary hypertension, unspecified: Secondary | ICD-10-CM | POA: Diagnosis not present

## 2023-03-03 DIAGNOSIS — E1142 Type 2 diabetes mellitus with diabetic polyneuropathy: Secondary | ICD-10-CM | POA: Diagnosis not present

## 2023-03-03 DIAGNOSIS — T84116D Breakdown (mechanical) of internal fixation device of bone of right lower leg, subsequent encounter: Secondary | ICD-10-CM | POA: Diagnosis not present

## 2023-03-03 DIAGNOSIS — Z794 Long term (current) use of insulin: Secondary | ICD-10-CM | POA: Diagnosis not present

## 2023-03-03 DIAGNOSIS — I4891 Unspecified atrial fibrillation: Secondary | ICD-10-CM | POA: Diagnosis not present

## 2023-03-03 DIAGNOSIS — G4733 Obstructive sleep apnea (adult) (pediatric): Secondary | ICD-10-CM | POA: Diagnosis not present

## 2023-03-03 DIAGNOSIS — E1151 Type 2 diabetes mellitus with diabetic peripheral angiopathy without gangrene: Secondary | ICD-10-CM | POA: Diagnosis not present

## 2023-03-03 DIAGNOSIS — N1832 Chronic kidney disease, stage 3b: Secondary | ICD-10-CM | POA: Diagnosis not present

## 2023-03-03 DIAGNOSIS — S81801D Unspecified open wound, right lower leg, subsequent encounter: Secondary | ICD-10-CM | POA: Diagnosis not present

## 2023-03-03 DIAGNOSIS — R296 Repeated falls: Secondary | ICD-10-CM | POA: Diagnosis not present

## 2023-03-03 DIAGNOSIS — I2510 Atherosclerotic heart disease of native coronary artery without angina pectoris: Secondary | ICD-10-CM | POA: Diagnosis not present

## 2023-03-03 DIAGNOSIS — J449 Chronic obstructive pulmonary disease, unspecified: Secondary | ICD-10-CM | POA: Diagnosis not present

## 2023-03-03 DIAGNOSIS — E1122 Type 2 diabetes mellitus with diabetic chronic kidney disease: Secondary | ICD-10-CM | POA: Diagnosis not present

## 2023-03-03 DIAGNOSIS — E1169 Type 2 diabetes mellitus with other specified complication: Secondary | ICD-10-CM | POA: Diagnosis not present

## 2023-03-03 DIAGNOSIS — E785 Hyperlipidemia, unspecified: Secondary | ICD-10-CM | POA: Diagnosis not present

## 2023-03-03 DIAGNOSIS — I129 Hypertensive chronic kidney disease with stage 1 through stage 4 chronic kidney disease, or unspecified chronic kidney disease: Secondary | ICD-10-CM | POA: Diagnosis not present

## 2023-03-03 DIAGNOSIS — I872 Venous insufficiency (chronic) (peripheral): Secondary | ICD-10-CM | POA: Diagnosis not present

## 2023-03-03 DIAGNOSIS — D631 Anemia in chronic kidney disease: Secondary | ICD-10-CM | POA: Diagnosis not present

## 2023-03-03 DIAGNOSIS — I951 Orthostatic hypotension: Secondary | ICD-10-CM | POA: Diagnosis not present

## 2023-03-03 DIAGNOSIS — M109 Gout, unspecified: Secondary | ICD-10-CM | POA: Diagnosis not present

## 2023-03-03 DIAGNOSIS — E039 Hypothyroidism, unspecified: Secondary | ICD-10-CM | POA: Diagnosis not present

## 2023-03-06 DIAGNOSIS — E1122 Type 2 diabetes mellitus with diabetic chronic kidney disease: Secondary | ICD-10-CM | POA: Diagnosis not present

## 2023-03-06 DIAGNOSIS — T84116D Breakdown (mechanical) of internal fixation device of bone of right lower leg, subsequent encounter: Secondary | ICD-10-CM | POA: Diagnosis not present

## 2023-03-06 DIAGNOSIS — E1151 Type 2 diabetes mellitus with diabetic peripheral angiopathy without gangrene: Secondary | ICD-10-CM | POA: Diagnosis not present

## 2023-03-06 DIAGNOSIS — I129 Hypertensive chronic kidney disease with stage 1 through stage 4 chronic kidney disease, or unspecified chronic kidney disease: Secondary | ICD-10-CM | POA: Diagnosis not present

## 2023-03-06 DIAGNOSIS — Z794 Long term (current) use of insulin: Secondary | ICD-10-CM | POA: Diagnosis not present

## 2023-03-06 DIAGNOSIS — D631 Anemia in chronic kidney disease: Secondary | ICD-10-CM | POA: Diagnosis not present

## 2023-03-06 DIAGNOSIS — Z0189 Encounter for other specified special examinations: Secondary | ICD-10-CM | POA: Diagnosis not present

## 2023-03-06 DIAGNOSIS — R296 Repeated falls: Secondary | ICD-10-CM | POA: Diagnosis not present

## 2023-03-06 DIAGNOSIS — I951 Orthostatic hypotension: Secondary | ICD-10-CM | POA: Diagnosis not present

## 2023-03-06 DIAGNOSIS — I872 Venous insufficiency (chronic) (peripheral): Secondary | ICD-10-CM | POA: Diagnosis not present

## 2023-03-06 DIAGNOSIS — S81801D Unspecified open wound, right lower leg, subsequent encounter: Secondary | ICD-10-CM | POA: Diagnosis not present

## 2023-03-06 DIAGNOSIS — E785 Hyperlipidemia, unspecified: Secondary | ICD-10-CM | POA: Diagnosis not present

## 2023-03-06 DIAGNOSIS — M109 Gout, unspecified: Secondary | ICD-10-CM | POA: Diagnosis not present

## 2023-03-06 DIAGNOSIS — G4733 Obstructive sleep apnea (adult) (pediatric): Secondary | ICD-10-CM | POA: Diagnosis not present

## 2023-03-06 DIAGNOSIS — E039 Hypothyroidism, unspecified: Secondary | ICD-10-CM | POA: Diagnosis not present

## 2023-03-06 DIAGNOSIS — E1169 Type 2 diabetes mellitus with other specified complication: Secondary | ICD-10-CM | POA: Diagnosis not present

## 2023-03-06 DIAGNOSIS — I272 Pulmonary hypertension, unspecified: Secondary | ICD-10-CM | POA: Diagnosis not present

## 2023-03-06 DIAGNOSIS — J449 Chronic obstructive pulmonary disease, unspecified: Secondary | ICD-10-CM | POA: Diagnosis not present

## 2023-03-06 DIAGNOSIS — I4891 Unspecified atrial fibrillation: Secondary | ICD-10-CM | POA: Diagnosis not present

## 2023-03-06 DIAGNOSIS — N1832 Chronic kidney disease, stage 3b: Secondary | ICD-10-CM | POA: Diagnosis not present

## 2023-03-06 DIAGNOSIS — E1142 Type 2 diabetes mellitus with diabetic polyneuropathy: Secondary | ICD-10-CM | POA: Diagnosis not present

## 2023-03-06 DIAGNOSIS — I2510 Atherosclerotic heart disease of native coronary artery without angina pectoris: Secondary | ICD-10-CM | POA: Diagnosis not present

## 2023-03-08 DIAGNOSIS — J449 Chronic obstructive pulmonary disease, unspecified: Secondary | ICD-10-CM | POA: Diagnosis not present

## 2023-03-08 DIAGNOSIS — E1142 Type 2 diabetes mellitus with diabetic polyneuropathy: Secondary | ICD-10-CM | POA: Diagnosis not present

## 2023-03-08 DIAGNOSIS — I951 Orthostatic hypotension: Secondary | ICD-10-CM | POA: Diagnosis not present

## 2023-03-08 DIAGNOSIS — G4733 Obstructive sleep apnea (adult) (pediatric): Secondary | ICD-10-CM | POA: Diagnosis not present

## 2023-03-08 DIAGNOSIS — E1122 Type 2 diabetes mellitus with diabetic chronic kidney disease: Secondary | ICD-10-CM | POA: Diagnosis not present

## 2023-03-08 DIAGNOSIS — E785 Hyperlipidemia, unspecified: Secondary | ICD-10-CM | POA: Diagnosis not present

## 2023-03-08 DIAGNOSIS — R296 Repeated falls: Secondary | ICD-10-CM | POA: Diagnosis not present

## 2023-03-08 DIAGNOSIS — E1169 Type 2 diabetes mellitus with other specified complication: Secondary | ICD-10-CM | POA: Diagnosis not present

## 2023-03-08 DIAGNOSIS — E1151 Type 2 diabetes mellitus with diabetic peripheral angiopathy without gangrene: Secondary | ICD-10-CM | POA: Diagnosis not present

## 2023-03-08 DIAGNOSIS — I872 Venous insufficiency (chronic) (peripheral): Secondary | ICD-10-CM | POA: Diagnosis not present

## 2023-03-08 DIAGNOSIS — D631 Anemia in chronic kidney disease: Secondary | ICD-10-CM | POA: Diagnosis not present

## 2023-03-08 DIAGNOSIS — I4891 Unspecified atrial fibrillation: Secondary | ICD-10-CM | POA: Diagnosis not present

## 2023-03-08 DIAGNOSIS — E039 Hypothyroidism, unspecified: Secondary | ICD-10-CM | POA: Diagnosis not present

## 2023-03-08 DIAGNOSIS — I129 Hypertensive chronic kidney disease with stage 1 through stage 4 chronic kidney disease, or unspecified chronic kidney disease: Secondary | ICD-10-CM | POA: Diagnosis not present

## 2023-03-08 DIAGNOSIS — Z794 Long term (current) use of insulin: Secondary | ICD-10-CM | POA: Diagnosis not present

## 2023-03-08 DIAGNOSIS — M109 Gout, unspecified: Secondary | ICD-10-CM | POA: Diagnosis not present

## 2023-03-08 DIAGNOSIS — N1832 Chronic kidney disease, stage 3b: Secondary | ICD-10-CM | POA: Diagnosis not present

## 2023-03-08 DIAGNOSIS — I2510 Atherosclerotic heart disease of native coronary artery without angina pectoris: Secondary | ICD-10-CM | POA: Diagnosis not present

## 2023-03-08 DIAGNOSIS — I272 Pulmonary hypertension, unspecified: Secondary | ICD-10-CM | POA: Diagnosis not present

## 2023-03-08 DIAGNOSIS — S81801D Unspecified open wound, right lower leg, subsequent encounter: Secondary | ICD-10-CM | POA: Diagnosis not present

## 2023-03-08 DIAGNOSIS — T84116D Breakdown (mechanical) of internal fixation device of bone of right lower leg, subsequent encounter: Secondary | ICD-10-CM | POA: Diagnosis not present

## 2023-03-11 ENCOUNTER — Ambulatory Visit: Payer: Medicare Other | Admitting: Diagnostic Neuroimaging

## 2023-03-12 DIAGNOSIS — T8484XD Pain due to internal orthopedic prosthetic devices, implants and grafts, subsequent encounter: Secondary | ICD-10-CM | POA: Diagnosis not present

## 2023-03-12 DIAGNOSIS — S93431D Sprain of tibiofibular ligament of right ankle, subsequent encounter: Secondary | ICD-10-CM | POA: Diagnosis not present

## 2023-03-12 DIAGNOSIS — S82851E Displaced trimalleolar fracture of right lower leg, subsequent encounter for open fracture type I or II with routine healing: Secondary | ICD-10-CM | POA: Diagnosis not present

## 2023-03-12 DIAGNOSIS — S9304XD Dislocation of right ankle joint, subsequent encounter: Secondary | ICD-10-CM | POA: Diagnosis not present

## 2023-03-13 DIAGNOSIS — I272 Pulmonary hypertension, unspecified: Secondary | ICD-10-CM | POA: Diagnosis not present

## 2023-03-13 DIAGNOSIS — N1832 Chronic kidney disease, stage 3b: Secondary | ICD-10-CM | POA: Diagnosis not present

## 2023-03-13 DIAGNOSIS — M109 Gout, unspecified: Secondary | ICD-10-CM | POA: Diagnosis not present

## 2023-03-13 DIAGNOSIS — E1142 Type 2 diabetes mellitus with diabetic polyneuropathy: Secondary | ICD-10-CM | POA: Diagnosis not present

## 2023-03-13 DIAGNOSIS — I872 Venous insufficiency (chronic) (peripheral): Secondary | ICD-10-CM | POA: Diagnosis not present

## 2023-03-13 DIAGNOSIS — S81801D Unspecified open wound, right lower leg, subsequent encounter: Secondary | ICD-10-CM | POA: Diagnosis not present

## 2023-03-13 DIAGNOSIS — I129 Hypertensive chronic kidney disease with stage 1 through stage 4 chronic kidney disease, or unspecified chronic kidney disease: Secondary | ICD-10-CM | POA: Diagnosis not present

## 2023-03-13 DIAGNOSIS — T84116D Breakdown (mechanical) of internal fixation device of bone of right lower leg, subsequent encounter: Secondary | ICD-10-CM | POA: Diagnosis not present

## 2023-03-13 DIAGNOSIS — G4733 Obstructive sleep apnea (adult) (pediatric): Secondary | ICD-10-CM | POA: Diagnosis not present

## 2023-03-13 DIAGNOSIS — I2510 Atherosclerotic heart disease of native coronary artery without angina pectoris: Secondary | ICD-10-CM | POA: Diagnosis not present

## 2023-03-13 DIAGNOSIS — E1151 Type 2 diabetes mellitus with diabetic peripheral angiopathy without gangrene: Secondary | ICD-10-CM | POA: Diagnosis not present

## 2023-03-13 DIAGNOSIS — Z794 Long term (current) use of insulin: Secondary | ICD-10-CM | POA: Diagnosis not present

## 2023-03-13 DIAGNOSIS — E785 Hyperlipidemia, unspecified: Secondary | ICD-10-CM | POA: Diagnosis not present

## 2023-03-13 DIAGNOSIS — I951 Orthostatic hypotension: Secondary | ICD-10-CM | POA: Diagnosis not present

## 2023-03-13 DIAGNOSIS — R296 Repeated falls: Secondary | ICD-10-CM | POA: Diagnosis not present

## 2023-03-13 DIAGNOSIS — E039 Hypothyroidism, unspecified: Secondary | ICD-10-CM | POA: Diagnosis not present

## 2023-03-13 DIAGNOSIS — D631 Anemia in chronic kidney disease: Secondary | ICD-10-CM | POA: Diagnosis not present

## 2023-03-13 DIAGNOSIS — J449 Chronic obstructive pulmonary disease, unspecified: Secondary | ICD-10-CM | POA: Diagnosis not present

## 2023-03-13 DIAGNOSIS — E1122 Type 2 diabetes mellitus with diabetic chronic kidney disease: Secondary | ICD-10-CM | POA: Diagnosis not present

## 2023-03-13 DIAGNOSIS — I4891 Unspecified atrial fibrillation: Secondary | ICD-10-CM | POA: Diagnosis not present

## 2023-03-13 DIAGNOSIS — E1169 Type 2 diabetes mellitus with other specified complication: Secondary | ICD-10-CM | POA: Diagnosis not present

## 2023-03-17 ENCOUNTER — Ambulatory Visit: Payer: Medicare Other | Admitting: Podiatry

## 2023-03-17 DIAGNOSIS — Z794 Long term (current) use of insulin: Secondary | ICD-10-CM | POA: Diagnosis not present

## 2023-03-17 DIAGNOSIS — I5032 Chronic diastolic (congestive) heart failure: Secondary | ICD-10-CM | POA: Diagnosis not present

## 2023-03-17 DIAGNOSIS — Z9989 Dependence on other enabling machines and devices: Secondary | ICD-10-CM | POA: Diagnosis not present

## 2023-03-17 DIAGNOSIS — I70213 Atherosclerosis of native arteries of extremities with intermittent claudication, bilateral legs: Secondary | ICD-10-CM | POA: Diagnosis not present

## 2023-03-17 DIAGNOSIS — E1122 Type 2 diabetes mellitus with diabetic chronic kidney disease: Secondary | ICD-10-CM | POA: Diagnosis not present

## 2023-03-17 DIAGNOSIS — I10 Essential (primary) hypertension: Secondary | ICD-10-CM | POA: Diagnosis not present

## 2023-03-17 DIAGNOSIS — J449 Chronic obstructive pulmonary disease, unspecified: Secondary | ICD-10-CM | POA: Diagnosis not present

## 2023-03-17 DIAGNOSIS — E039 Hypothyroidism, unspecified: Secondary | ICD-10-CM | POA: Diagnosis not present

## 2023-03-17 DIAGNOSIS — I2510 Atherosclerotic heart disease of native coronary artery without angina pectoris: Secondary | ICD-10-CM | POA: Diagnosis not present

## 2023-03-17 DIAGNOSIS — R54 Age-related physical debility: Secondary | ICD-10-CM | POA: Diagnosis not present

## 2023-03-17 DIAGNOSIS — D649 Anemia, unspecified: Secondary | ICD-10-CM | POA: Diagnosis not present

## 2023-03-17 DIAGNOSIS — E1165 Type 2 diabetes mellitus with hyperglycemia: Secondary | ICD-10-CM | POA: Diagnosis not present

## 2023-03-18 DIAGNOSIS — N1832 Chronic kidney disease, stage 3b: Secondary | ICD-10-CM | POA: Diagnosis not present

## 2023-03-18 DIAGNOSIS — E1142 Type 2 diabetes mellitus with diabetic polyneuropathy: Secondary | ICD-10-CM | POA: Diagnosis not present

## 2023-03-18 DIAGNOSIS — Z794 Long term (current) use of insulin: Secondary | ICD-10-CM | POA: Diagnosis not present

## 2023-03-18 DIAGNOSIS — E1122 Type 2 diabetes mellitus with diabetic chronic kidney disease: Secondary | ICD-10-CM | POA: Diagnosis not present

## 2023-03-18 DIAGNOSIS — I872 Venous insufficiency (chronic) (peripheral): Secondary | ICD-10-CM | POA: Diagnosis not present

## 2023-03-18 DIAGNOSIS — I4891 Unspecified atrial fibrillation: Secondary | ICD-10-CM | POA: Diagnosis not present

## 2023-03-18 DIAGNOSIS — E1169 Type 2 diabetes mellitus with other specified complication: Secondary | ICD-10-CM | POA: Diagnosis not present

## 2023-03-18 DIAGNOSIS — E039 Hypothyroidism, unspecified: Secondary | ICD-10-CM | POA: Diagnosis not present

## 2023-03-18 DIAGNOSIS — I2510 Atherosclerotic heart disease of native coronary artery without angina pectoris: Secondary | ICD-10-CM | POA: Diagnosis not present

## 2023-03-18 DIAGNOSIS — J449 Chronic obstructive pulmonary disease, unspecified: Secondary | ICD-10-CM | POA: Diagnosis not present

## 2023-03-18 DIAGNOSIS — I951 Orthostatic hypotension: Secondary | ICD-10-CM | POA: Diagnosis not present

## 2023-03-18 DIAGNOSIS — D631 Anemia in chronic kidney disease: Secondary | ICD-10-CM | POA: Diagnosis not present

## 2023-03-18 DIAGNOSIS — I129 Hypertensive chronic kidney disease with stage 1 through stage 4 chronic kidney disease, or unspecified chronic kidney disease: Secondary | ICD-10-CM | POA: Diagnosis not present

## 2023-03-18 DIAGNOSIS — T84116D Breakdown (mechanical) of internal fixation device of bone of right lower leg, subsequent encounter: Secondary | ICD-10-CM | POA: Diagnosis not present

## 2023-03-18 DIAGNOSIS — M109 Gout, unspecified: Secondary | ICD-10-CM | POA: Diagnosis not present

## 2023-03-18 DIAGNOSIS — I272 Pulmonary hypertension, unspecified: Secondary | ICD-10-CM | POA: Diagnosis not present

## 2023-03-18 DIAGNOSIS — G4733 Obstructive sleep apnea (adult) (pediatric): Secondary | ICD-10-CM | POA: Diagnosis not present

## 2023-03-18 DIAGNOSIS — E1151 Type 2 diabetes mellitus with diabetic peripheral angiopathy without gangrene: Secondary | ICD-10-CM | POA: Diagnosis not present

## 2023-03-18 DIAGNOSIS — R296 Repeated falls: Secondary | ICD-10-CM | POA: Diagnosis not present

## 2023-03-18 DIAGNOSIS — S81801D Unspecified open wound, right lower leg, subsequent encounter: Secondary | ICD-10-CM | POA: Diagnosis not present

## 2023-03-18 DIAGNOSIS — E785 Hyperlipidemia, unspecified: Secondary | ICD-10-CM | POA: Diagnosis not present

## 2023-03-19 ENCOUNTER — Ambulatory Visit: Payer: Medicare Other | Admitting: Nurse Practitioner

## 2023-03-19 ENCOUNTER — Ambulatory Visit: Payer: Medicare Other | Admitting: Adult Health

## 2023-03-20 ENCOUNTER — Ambulatory Visit: Payer: Medicare Other | Admitting: Neurology

## 2023-03-20 DIAGNOSIS — S82851E Displaced trimalleolar fracture of right lower leg, subsequent encounter for open fracture type I or II with routine healing: Secondary | ICD-10-CM | POA: Diagnosis not present

## 2023-03-20 DIAGNOSIS — S82891D Other fracture of right lower leg, subsequent encounter for closed fracture with routine healing: Secondary | ICD-10-CM | POA: Diagnosis not present

## 2023-03-21 DIAGNOSIS — I2510 Atherosclerotic heart disease of native coronary artery without angina pectoris: Secondary | ICD-10-CM | POA: Diagnosis not present

## 2023-03-21 DIAGNOSIS — E1169 Type 2 diabetes mellitus with other specified complication: Secondary | ICD-10-CM | POA: Diagnosis not present

## 2023-03-21 DIAGNOSIS — I951 Orthostatic hypotension: Secondary | ICD-10-CM | POA: Diagnosis not present

## 2023-03-21 DIAGNOSIS — S81801D Unspecified open wound, right lower leg, subsequent encounter: Secondary | ICD-10-CM | POA: Diagnosis not present

## 2023-03-21 DIAGNOSIS — J449 Chronic obstructive pulmonary disease, unspecified: Secondary | ICD-10-CM | POA: Diagnosis not present

## 2023-03-21 DIAGNOSIS — E1151 Type 2 diabetes mellitus with diabetic peripheral angiopathy without gangrene: Secondary | ICD-10-CM | POA: Diagnosis not present

## 2023-03-21 DIAGNOSIS — R296 Repeated falls: Secondary | ICD-10-CM | POA: Diagnosis not present

## 2023-03-21 DIAGNOSIS — I129 Hypertensive chronic kidney disease with stage 1 through stage 4 chronic kidney disease, or unspecified chronic kidney disease: Secondary | ICD-10-CM | POA: Diagnosis not present

## 2023-03-21 DIAGNOSIS — M109 Gout, unspecified: Secondary | ICD-10-CM | POA: Diagnosis not present

## 2023-03-21 DIAGNOSIS — G4733 Obstructive sleep apnea (adult) (pediatric): Secondary | ICD-10-CM | POA: Diagnosis not present

## 2023-03-21 DIAGNOSIS — Z794 Long term (current) use of insulin: Secondary | ICD-10-CM | POA: Diagnosis not present

## 2023-03-21 DIAGNOSIS — E039 Hypothyroidism, unspecified: Secondary | ICD-10-CM | POA: Diagnosis not present

## 2023-03-21 DIAGNOSIS — D631 Anemia in chronic kidney disease: Secondary | ICD-10-CM | POA: Diagnosis not present

## 2023-03-21 DIAGNOSIS — E1142 Type 2 diabetes mellitus with diabetic polyneuropathy: Secondary | ICD-10-CM | POA: Diagnosis not present

## 2023-03-21 DIAGNOSIS — E1122 Type 2 diabetes mellitus with diabetic chronic kidney disease: Secondary | ICD-10-CM | POA: Diagnosis not present

## 2023-03-21 DIAGNOSIS — T84116D Breakdown (mechanical) of internal fixation device of bone of right lower leg, subsequent encounter: Secondary | ICD-10-CM | POA: Diagnosis not present

## 2023-03-21 DIAGNOSIS — N1832 Chronic kidney disease, stage 3b: Secondary | ICD-10-CM | POA: Diagnosis not present

## 2023-03-21 DIAGNOSIS — I272 Pulmonary hypertension, unspecified: Secondary | ICD-10-CM | POA: Diagnosis not present

## 2023-03-21 DIAGNOSIS — I872 Venous insufficiency (chronic) (peripheral): Secondary | ICD-10-CM | POA: Diagnosis not present

## 2023-03-21 DIAGNOSIS — I4891 Unspecified atrial fibrillation: Secondary | ICD-10-CM | POA: Diagnosis not present

## 2023-03-21 DIAGNOSIS — E785 Hyperlipidemia, unspecified: Secondary | ICD-10-CM | POA: Diagnosis not present

## 2023-03-24 DIAGNOSIS — R051 Acute cough: Secondary | ICD-10-CM | POA: Diagnosis not present

## 2023-03-27 ENCOUNTER — Ambulatory Visit: Payer: Medicare Other | Admitting: Podiatry

## 2023-03-27 ENCOUNTER — Emergency Department (HOSPITAL_COMMUNITY): Payer: Medicare Other

## 2023-03-27 ENCOUNTER — Encounter (HOSPITAL_COMMUNITY): Payer: Self-pay | Admitting: Emergency Medicine

## 2023-03-27 ENCOUNTER — Inpatient Hospital Stay (HOSPITAL_COMMUNITY)
Admission: EM | Admit: 2023-03-27 | Discharge: 2023-05-10 | DRG: 463 | Disposition: A | Discharge status: Home Health | Payer: Medicare Other | Attending: Family Medicine | Admitting: Family Medicine

## 2023-03-27 DIAGNOSIS — E1169 Type 2 diabetes mellitus with other specified complication: Secondary | ICD-10-CM | POA: Diagnosis present

## 2023-03-27 DIAGNOSIS — I4819 Other persistent atrial fibrillation: Secondary | ICD-10-CM | POA: Diagnosis present

## 2023-03-27 DIAGNOSIS — E872 Acidosis, unspecified: Secondary | ICD-10-CM | POA: Diagnosis present

## 2023-03-27 DIAGNOSIS — E1140 Type 2 diabetes mellitus with diabetic neuropathy, unspecified: Secondary | ICD-10-CM | POA: Diagnosis not present

## 2023-03-27 DIAGNOSIS — L03115 Cellulitis of right lower limb: Secondary | ICD-10-CM

## 2023-03-27 DIAGNOSIS — I951 Orthostatic hypotension: Secondary | ICD-10-CM | POA: Diagnosis present

## 2023-03-27 DIAGNOSIS — L02619 Cutaneous abscess of unspecified foot: Secondary | ICD-10-CM | POA: Diagnosis not present

## 2023-03-27 DIAGNOSIS — S9304XA Dislocation of right ankle joint, initial encounter: Secondary | ICD-10-CM | POA: Diagnosis not present

## 2023-03-27 DIAGNOSIS — E8809 Other disorders of plasma-protein metabolism, not elsewhere classified: Secondary | ICD-10-CM | POA: Diagnosis present

## 2023-03-27 DIAGNOSIS — E1152 Type 2 diabetes mellitus with diabetic peripheral angiopathy with gangrene: Secondary | ICD-10-CM | POA: Diagnosis present

## 2023-03-27 DIAGNOSIS — D631 Anemia in chronic kidney disease: Secondary | ICD-10-CM | POA: Diagnosis present

## 2023-03-27 DIAGNOSIS — Z1152 Encounter for screening for COVID-19: Secondary | ICD-10-CM

## 2023-03-27 DIAGNOSIS — M109 Gout, unspecified: Secondary | ICD-10-CM | POA: Diagnosis not present

## 2023-03-27 DIAGNOSIS — M869 Osteomyelitis, unspecified: Secondary | ICD-10-CM | POA: Diagnosis not present

## 2023-03-27 DIAGNOSIS — D696 Thrombocytopenia, unspecified: Secondary | ICD-10-CM | POA: Diagnosis not present

## 2023-03-27 DIAGNOSIS — Y838 Other surgical procedures as the cause of abnormal reaction of the patient, or of later complication, without mention of misadventure at the time of the procedure: Secondary | ICD-10-CM | POA: Diagnosis not present

## 2023-03-27 DIAGNOSIS — Z882 Allergy status to sulfonamides status: Secondary | ICD-10-CM

## 2023-03-27 DIAGNOSIS — G9341 Metabolic encephalopathy: Secondary | ICD-10-CM | POA: Diagnosis not present

## 2023-03-27 DIAGNOSIS — N183 Chronic kidney disease, stage 3 unspecified: Secondary | ICD-10-CM | POA: Diagnosis not present

## 2023-03-27 DIAGNOSIS — I5031 Acute diastolic (congestive) heart failure: Secondary | ICD-10-CM | POA: Diagnosis not present

## 2023-03-27 DIAGNOSIS — S82891D Other fracture of right lower leg, subsequent encounter for closed fracture with routine healing: Secondary | ICD-10-CM | POA: Diagnosis not present

## 2023-03-27 DIAGNOSIS — I130 Hypertensive heart and chronic kidney disease with heart failure and stage 1 through stage 4 chronic kidney disease, or unspecified chronic kidney disease: Secondary | ICD-10-CM | POA: Diagnosis present

## 2023-03-27 DIAGNOSIS — Z7902 Long term (current) use of antithrombotics/antiplatelets: Secondary | ICD-10-CM

## 2023-03-27 DIAGNOSIS — E1142 Type 2 diabetes mellitus with diabetic polyneuropathy: Secondary | ICD-10-CM | POA: Diagnosis present

## 2023-03-27 DIAGNOSIS — L03119 Cellulitis of unspecified part of limb: Secondary | ICD-10-CM | POA: Diagnosis not present

## 2023-03-27 DIAGNOSIS — Z6839 Body mass index (BMI) 39.0-39.9, adult: Secondary | ICD-10-CM

## 2023-03-27 DIAGNOSIS — S82451A Displaced comminuted fracture of shaft of right fibula, initial encounter for closed fracture: Secondary | ICD-10-CM | POA: Diagnosis not present

## 2023-03-27 DIAGNOSIS — R4781 Slurred speech: Secondary | ICD-10-CM | POA: Diagnosis not present

## 2023-03-27 DIAGNOSIS — Z794 Long term (current) use of insulin: Secondary | ICD-10-CM

## 2023-03-27 DIAGNOSIS — R509 Fever, unspecified: Secondary | ICD-10-CM | POA: Diagnosis not present

## 2023-03-27 DIAGNOSIS — T8459XA Infection and inflammatory reaction due to other internal joint prosthesis, initial encounter: Secondary | ICD-10-CM | POA: Diagnosis not present

## 2023-03-27 DIAGNOSIS — Y831 Surgical operation with implant of artificial internal device as the cause of abnormal reaction of the patient, or of later complication, without mention of misadventure at the time of the procedure: Secondary | ICD-10-CM | POA: Diagnosis present

## 2023-03-27 DIAGNOSIS — I6523 Occlusion and stenosis of bilateral carotid arteries: Secondary | ICD-10-CM | POA: Diagnosis not present

## 2023-03-27 DIAGNOSIS — N189 Chronic kidney disease, unspecified: Secondary | ICD-10-CM

## 2023-03-27 DIAGNOSIS — I4581 Long QT syndrome: Secondary | ICD-10-CM | POA: Diagnosis not present

## 2023-03-27 DIAGNOSIS — J9601 Acute respiratory failure with hypoxia: Secondary | ICD-10-CM | POA: Diagnosis not present

## 2023-03-27 DIAGNOSIS — T8141XA Infection following a procedure, superficial incisional surgical site, initial encounter: Secondary | ICD-10-CM | POA: Diagnosis present

## 2023-03-27 DIAGNOSIS — R55 Syncope and collapse: Secondary | ICD-10-CM | POA: Diagnosis not present

## 2023-03-27 DIAGNOSIS — L97314 Non-pressure chronic ulcer of right ankle with necrosis of bone: Principal | ICD-10-CM

## 2023-03-27 DIAGNOSIS — S82851E Displaced trimalleolar fracture of right lower leg, subsequent encounter for open fracture type I or II with routine healing: Secondary | ICD-10-CM | POA: Diagnosis not present

## 2023-03-27 DIAGNOSIS — Z87891 Personal history of nicotine dependence: Secondary | ICD-10-CM

## 2023-03-27 DIAGNOSIS — R29810 Facial weakness: Secondary | ICD-10-CM | POA: Diagnosis not present

## 2023-03-27 DIAGNOSIS — R42 Dizziness and giddiness: Secondary | ICD-10-CM | POA: Diagnosis not present

## 2023-03-27 DIAGNOSIS — Z79890 Hormone replacement therapy (postmenopausal): Secondary | ICD-10-CM

## 2023-03-27 DIAGNOSIS — L02415 Cutaneous abscess of right lower limb: Secondary | ICD-10-CM | POA: Diagnosis not present

## 2023-03-27 DIAGNOSIS — R918 Other nonspecific abnormal finding of lung field: Secondary | ICD-10-CM | POA: Diagnosis not present

## 2023-03-27 DIAGNOSIS — Z452 Encounter for adjustment and management of vascular access device: Secondary | ICD-10-CM | POA: Diagnosis not present

## 2023-03-27 DIAGNOSIS — I672 Cerebral atherosclerosis: Secondary | ICD-10-CM | POA: Diagnosis not present

## 2023-03-27 DIAGNOSIS — E1165 Type 2 diabetes mellitus with hyperglycemia: Secondary | ICD-10-CM | POA: Diagnosis present

## 2023-03-27 DIAGNOSIS — J101 Influenza due to other identified influenza virus with other respiratory manifestations: Secondary | ICD-10-CM | POA: Diagnosis not present

## 2023-03-27 DIAGNOSIS — J449 Chronic obstructive pulmonary disease, unspecified: Secondary | ICD-10-CM | POA: Diagnosis present

## 2023-03-27 DIAGNOSIS — M7989 Other specified soft tissue disorders: Secondary | ICD-10-CM | POA: Diagnosis not present

## 2023-03-27 DIAGNOSIS — E871 Hypo-osmolality and hyponatremia: Secondary | ICD-10-CM | POA: Diagnosis present

## 2023-03-27 DIAGNOSIS — J9690 Respiratory failure, unspecified, unspecified whether with hypoxia or hypercapnia: Secondary | ICD-10-CM | POA: Diagnosis not present

## 2023-03-27 DIAGNOSIS — N170 Acute kidney failure with tubular necrosis: Secondary | ICD-10-CM | POA: Diagnosis not present

## 2023-03-27 DIAGNOSIS — T8469XA Infection and inflammatory reaction due to internal fixation device of other site, initial encounter: Secondary | ICD-10-CM | POA: Diagnosis not present

## 2023-03-27 DIAGNOSIS — I771 Stricture of artery: Secondary | ICD-10-CM | POA: Diagnosis not present

## 2023-03-27 DIAGNOSIS — M1612 Unilateral primary osteoarthritis, left hip: Secondary | ICD-10-CM | POA: Diagnosis not present

## 2023-03-27 DIAGNOSIS — E1151 Type 2 diabetes mellitus with diabetic peripheral angiopathy without gangrene: Secondary | ICD-10-CM | POA: Diagnosis not present

## 2023-03-27 DIAGNOSIS — N179 Acute kidney failure, unspecified: Secondary | ICD-10-CM | POA: Diagnosis not present

## 2023-03-27 DIAGNOSIS — L97319 Non-pressure chronic ulcer of right ankle with unspecified severity: Secondary | ICD-10-CM | POA: Diagnosis not present

## 2023-03-27 DIAGNOSIS — R531 Weakness: Secondary | ICD-10-CM | POA: Diagnosis not present

## 2023-03-27 DIAGNOSIS — L02611 Cutaneous abscess of right foot: Secondary | ICD-10-CM | POA: Diagnosis present

## 2023-03-27 DIAGNOSIS — Z992 Dependence on renal dialysis: Secondary | ICD-10-CM | POA: Diagnosis not present

## 2023-03-27 DIAGNOSIS — I2510 Atherosclerotic heart disease of native coronary artery without angina pectoris: Secondary | ICD-10-CM | POA: Diagnosis present

## 2023-03-27 DIAGNOSIS — I5033 Acute on chronic diastolic (congestive) heart failure: Secondary | ICD-10-CM | POA: Diagnosis not present

## 2023-03-27 DIAGNOSIS — T84624A Infection and inflammatory reaction due to internal fixation device of right fibula, initial encounter: Secondary | ICD-10-CM | POA: Diagnosis not present

## 2023-03-27 DIAGNOSIS — I5032 Chronic diastolic (congestive) heart failure: Secondary | ICD-10-CM | POA: Diagnosis not present

## 2023-03-27 DIAGNOSIS — I739 Peripheral vascular disease, unspecified: Secondary | ICD-10-CM | POA: Diagnosis not present

## 2023-03-27 DIAGNOSIS — Z79899 Other long term (current) drug therapy: Secondary | ICD-10-CM

## 2023-03-27 DIAGNOSIS — Z955 Presence of coronary angioplasty implant and graft: Secondary | ICD-10-CM

## 2023-03-27 DIAGNOSIS — Z7985 Long-term (current) use of injectable non-insulin antidiabetic drugs: Secondary | ICD-10-CM

## 2023-03-27 DIAGNOSIS — L89516 Pressure-induced deep tissue damage of right ankle: Secondary | ICD-10-CM | POA: Diagnosis present

## 2023-03-27 DIAGNOSIS — E039 Hypothyroidism, unspecified: Secondary | ICD-10-CM

## 2023-03-27 DIAGNOSIS — E876 Hypokalemia: Secondary | ICD-10-CM | POA: Diagnosis present

## 2023-03-27 DIAGNOSIS — I872 Venous insufficiency (chronic) (peripheral): Secondary | ICD-10-CM | POA: Diagnosis present

## 2023-03-27 DIAGNOSIS — Z7901 Long term (current) use of anticoagulants: Secondary | ICD-10-CM

## 2023-03-27 DIAGNOSIS — G4733 Obstructive sleep apnea (adult) (pediatric): Secondary | ICD-10-CM

## 2023-03-27 DIAGNOSIS — I272 Pulmonary hypertension, unspecified: Secondary | ICD-10-CM | POA: Diagnosis not present

## 2023-03-27 DIAGNOSIS — L089 Local infection of the skin and subcutaneous tissue, unspecified: Secondary | ICD-10-CM | POA: Diagnosis not present

## 2023-03-27 DIAGNOSIS — T847XXA Infection and inflammatory reaction due to other internal orthopedic prosthetic devices, implants and grafts, initial encounter: Secondary | ICD-10-CM | POA: Diagnosis not present

## 2023-03-27 DIAGNOSIS — N184 Chronic kidney disease, stage 4 (severe): Secondary | ICD-10-CM | POA: Diagnosis not present

## 2023-03-27 DIAGNOSIS — S81801A Unspecified open wound, right lower leg, initial encounter: Secondary | ICD-10-CM | POA: Diagnosis not present

## 2023-03-27 DIAGNOSIS — D649 Anemia, unspecified: Secondary | ICD-10-CM | POA: Diagnosis not present

## 2023-03-27 DIAGNOSIS — S82851A Displaced trimalleolar fracture of right lower leg, initial encounter for closed fracture: Secondary | ICD-10-CM | POA: Diagnosis not present

## 2023-03-27 DIAGNOSIS — E66812 Obesity, class 2: Secondary | ICD-10-CM | POA: Diagnosis present

## 2023-03-27 DIAGNOSIS — E1122 Type 2 diabetes mellitus with diabetic chronic kidney disease: Secondary | ICD-10-CM | POA: Diagnosis present

## 2023-03-27 DIAGNOSIS — T82838A Hemorrhage of vascular prosthetic devices, implants and grafts, initial encounter: Secondary | ICD-10-CM | POA: Diagnosis not present

## 2023-03-27 DIAGNOSIS — I70213 Atherosclerosis of native arteries of extremities with intermittent claudication, bilateral legs: Secondary | ICD-10-CM | POA: Diagnosis not present

## 2023-03-27 DIAGNOSIS — E559 Vitamin D deficiency, unspecified: Secondary | ICD-10-CM | POA: Diagnosis present

## 2023-03-27 DIAGNOSIS — Z472 Encounter for removal of internal fixation device: Secondary | ICD-10-CM | POA: Diagnosis not present

## 2023-03-27 DIAGNOSIS — R059 Cough, unspecified: Secondary | ICD-10-CM | POA: Diagnosis not present

## 2023-03-27 DIAGNOSIS — B9689 Other specified bacterial agents as the cause of diseases classified elsewhere: Secondary | ICD-10-CM | POA: Diagnosis not present

## 2023-03-27 DIAGNOSIS — R0602 Shortness of breath: Secondary | ICD-10-CM | POA: Diagnosis not present

## 2023-03-27 DIAGNOSIS — K5900 Constipation, unspecified: Secondary | ICD-10-CM | POA: Diagnosis not present

## 2023-03-27 DIAGNOSIS — M79652 Pain in left thigh: Secondary | ICD-10-CM | POA: Diagnosis not present

## 2023-03-27 DIAGNOSIS — E785 Hyperlipidemia, unspecified: Secondary | ICD-10-CM | POA: Diagnosis not present

## 2023-03-27 DIAGNOSIS — F419 Anxiety disorder, unspecified: Secondary | ICD-10-CM | POA: Diagnosis present

## 2023-03-27 DIAGNOSIS — B9562 Methicillin resistant Staphylococcus aureus infection as the cause of diseases classified elsewhere: Secondary | ICD-10-CM | POA: Diagnosis not present

## 2023-03-27 DIAGNOSIS — K219 Gastro-esophageal reflux disease without esophagitis: Secondary | ICD-10-CM | POA: Diagnosis present

## 2023-03-27 DIAGNOSIS — R296 Repeated falls: Secondary | ICD-10-CM | POA: Diagnosis not present

## 2023-03-27 DIAGNOSIS — Z888 Allergy status to other drugs, medicaments and biological substances status: Secondary | ICD-10-CM

## 2023-03-27 DIAGNOSIS — Z881 Allergy status to other antibiotic agents status: Secondary | ICD-10-CM

## 2023-03-27 DIAGNOSIS — G319 Degenerative disease of nervous system, unspecified: Secondary | ICD-10-CM | POA: Diagnosis not present

## 2023-03-27 DIAGNOSIS — Z880 Allergy status to penicillin: Secondary | ICD-10-CM

## 2023-03-27 DIAGNOSIS — M86171 Other acute osteomyelitis, right ankle and foot: Secondary | ICD-10-CM | POA: Diagnosis present

## 2023-03-27 DIAGNOSIS — E11628 Type 2 diabetes mellitus with other skin complications: Secondary | ICD-10-CM | POA: Diagnosis not present

## 2023-03-27 DIAGNOSIS — R6889 Other general symptoms and signs: Secondary | ICD-10-CM | POA: Diagnosis not present

## 2023-03-27 DIAGNOSIS — T84116D Breakdown (mechanical) of internal fixation device of bone of right lower leg, subsequent encounter: Secondary | ICD-10-CM | POA: Diagnosis not present

## 2023-03-27 DIAGNOSIS — N1832 Chronic kidney disease, stage 3b: Secondary | ICD-10-CM | POA: Diagnosis not present

## 2023-03-27 DIAGNOSIS — R600 Localized edema: Secondary | ICD-10-CM | POA: Diagnosis not present

## 2023-03-27 DIAGNOSIS — R197 Diarrhea, unspecified: Secondary | ICD-10-CM | POA: Diagnosis present

## 2023-03-27 DIAGNOSIS — S93431A Sprain of tibiofibular ligament of right ankle, initial encounter: Secondary | ICD-10-CM | POA: Diagnosis not present

## 2023-03-27 DIAGNOSIS — F32A Depression, unspecified: Secondary | ICD-10-CM | POA: Diagnosis present

## 2023-03-27 DIAGNOSIS — E1022 Type 1 diabetes mellitus with diabetic chronic kidney disease: Secondary | ICD-10-CM | POA: Diagnosis not present

## 2023-03-27 DIAGNOSIS — I110 Hypertensive heart disease with heart failure: Secondary | ICD-10-CM | POA: Diagnosis not present

## 2023-03-27 DIAGNOSIS — E11622 Type 2 diabetes mellitus with other skin ulcer: Secondary | ICD-10-CM | POA: Diagnosis present

## 2023-03-27 DIAGNOSIS — I4891 Unspecified atrial fibrillation: Secondary | ICD-10-CM | POA: Diagnosis not present

## 2023-03-27 DIAGNOSIS — I10 Essential (primary) hypertension: Secondary | ICD-10-CM

## 2023-03-27 DIAGNOSIS — M25571 Pain in right ankle and joints of right foot: Secondary | ICD-10-CM | POA: Diagnosis present

## 2023-03-27 DIAGNOSIS — Z7982 Long term (current) use of aspirin: Secondary | ICD-10-CM

## 2023-03-27 DIAGNOSIS — I480 Paroxysmal atrial fibrillation: Secondary | ICD-10-CM | POA: Diagnosis not present

## 2023-03-27 DIAGNOSIS — I129 Hypertensive chronic kidney disease with stage 1 through stage 4 chronic kidney disease, or unspecified chronic kidney disease: Secondary | ICD-10-CM | POA: Diagnosis not present

## 2023-03-27 DIAGNOSIS — T84418A Breakdown (mechanical) of other internal orthopedic devices, implants and grafts, initial encounter: Secondary | ICD-10-CM | POA: Diagnosis not present

## 2023-03-27 DIAGNOSIS — M19071 Primary osteoarthritis, right ankle and foot: Secondary | ICD-10-CM | POA: Diagnosis not present

## 2023-03-27 DIAGNOSIS — E1110 Type 2 diabetes mellitus with ketoacidosis without coma: Secondary | ICD-10-CM | POA: Diagnosis not present

## 2023-03-27 DIAGNOSIS — I499 Cardiac arrhythmia, unspecified: Secondary | ICD-10-CM | POA: Diagnosis not present

## 2023-03-27 DIAGNOSIS — M01X71 Direct infection of right ankle and foot in infectious and parasitic diseases classified elsewhere: Secondary | ICD-10-CM | POA: Diagnosis not present

## 2023-03-27 DIAGNOSIS — S81801D Unspecified open wound, right lower leg, subsequent encounter: Secondary | ICD-10-CM | POA: Diagnosis not present

## 2023-03-27 LAB — COMPREHENSIVE METABOLIC PANEL
ALT: 11 U/L (ref 0–44)
AST: 21 U/L (ref 15–41)
Albumin: 2.3 g/dL — ABNORMAL LOW (ref 3.5–5.0)
Alkaline Phosphatase: 77 U/L (ref 38–126)
Anion gap: 14 (ref 5–15)
BUN: 22 mg/dL (ref 8–23)
CO2: 22 mmol/L (ref 22–32)
Calcium: 8.8 mg/dL — ABNORMAL LOW (ref 8.9–10.3)
Chloride: 99 mmol/L (ref 98–111)
Creatinine, Ser: 2.73 mg/dL — ABNORMAL HIGH (ref 0.61–1.24)
GFR, Estimated: 24 mL/min — ABNORMAL LOW (ref >60)
Glucose, Bld: 241 mg/dL — ABNORMAL HIGH (ref 70–99)
Potassium: 3.5 mmol/L (ref 3.5–5.1)
Sodium: 135 mmol/L (ref 135–145)
Total Bilirubin: 0.8 mg/dL (ref 0.0–1.2)
Total Protein: 6.1 g/dL — ABNORMAL LOW (ref 6.5–8.1)

## 2023-03-27 LAB — CBC WITH DIFFERENTIAL/PLATELET
Abs Immature Granulocytes: 0.03 10*3/uL (ref 0.00–0.07)
Basophils Absolute: 0 10*3/uL (ref 0.0–0.1)
Basophils Relative: 0 %
Eosinophils Absolute: 0.1 10*3/uL (ref 0.0–0.5)
Eosinophils Relative: 1 %
HCT: 40.1 % (ref 39.0–52.0)
Hemoglobin: 14.1 g/dL (ref 13.0–17.0)
Immature Granulocytes: 0 %
Lymphocytes Relative: 14 %
Lymphs Abs: 1 10*3/uL (ref 0.7–4.0)
MCH: 29.9 pg (ref 26.0–34.0)
MCHC: 35.2 g/dL (ref 30.0–36.0)
MCV: 85 fL (ref 80.0–100.0)
Monocytes Absolute: 0.6 10*3/uL (ref 0.1–1.0)
Monocytes Relative: 8 %
Neutro Abs: 5.6 10*3/uL (ref 1.7–7.7)
Neutrophils Relative %: 77 %
Platelets: 189 10*3/uL (ref 150–400)
RBC: 4.72 MIL/uL (ref 4.22–5.81)
RDW: 13.5 % (ref 11.5–15.5)
WBC: 7.4 10*3/uL (ref 4.0–10.5)
nRBC: 0 % (ref 0.0–0.2)

## 2023-03-27 LAB — RESP PANEL BY RT-PCR (RSV, FLU A&B, COVID)  RVPGX2
Influenza A by PCR: POSITIVE — AB
Influenza B by PCR: NEGATIVE
Resp Syncytial Virus by PCR: NEGATIVE
SARS Coronavirus 2 by RT PCR: NEGATIVE

## 2023-03-27 LAB — MAGNESIUM: Magnesium: 1.9 mg/dL (ref 1.7–2.4)

## 2023-03-27 MED ORDER — VANCOMYCIN HCL 10 G IV SOLR
2500.0000 mg | Freq: Once | INTRAVENOUS | Status: AC
  Administered 2023-03-28: 2500 mg via INTRAVENOUS
  Filled 2023-03-27: qty 2500

## 2023-03-27 MED ORDER — BENZONATATE 100 MG PO CAPS: 200.0000 mg | ORAL_CAPSULE | Freq: Three times a day (TID) | ORAL | Status: DC | PRN

## 2023-03-27 MED ORDER — GUAIFENESIN ER 600 MG PO TB12
600.0000 mg | ORAL_TABLET | Freq: Two times a day (BID) | ORAL | Status: DC
  Administered 2023-03-28 – 2023-05-10 (×86): 600 mg via ORAL
  Filled 2023-03-27 (×87): qty 1

## 2023-03-27 MED ORDER — VANCOMYCIN HCL 2000 MG/400ML IV SOLN
2000.0000 mg | INTRAVENOUS | Status: DC
  Administered 2023-03-29 – 2023-04-02 (×3): 2000 mg via INTRAVENOUS
  Filled 2023-03-27 (×3): qty 400

## 2023-03-27 MED ORDER — BENZONATATE 100 MG PO CAPS
200.0000 mg | ORAL_CAPSULE | Freq: Once | ORAL | Status: AC
  Administered 2023-03-27: 200 mg via ORAL
  Filled 2023-03-27: qty 2

## 2023-03-27 MED ORDER — FENTANYL CITRATE PF 50 MCG/ML IJ SOSY
50.0000 ug | PREFILLED_SYRINGE | INTRAMUSCULAR | Status: DC | PRN
  Administered 2023-03-28: 50 ug via INTRAVENOUS
  Filled 2023-03-27: qty 1

## 2023-03-27 MED ORDER — NALOXONE HCL 0.4 MG/ML IJ SOLN: 0.4000 mg | INTRAMUSCULAR | Status: DC | PRN

## 2023-03-27 MED ORDER — MELATONIN 3 MG PO TABS
3.0000 mg | ORAL_TABLET | Freq: Every evening | ORAL | Status: DC | PRN
  Administered 2023-03-29 – 2023-05-09 (×33): 3 mg via ORAL
  Filled 2023-03-27 (×35): qty 1

## 2023-03-27 MED ORDER — CEFAZOLIN SODIUM-DEXTROSE 2-4 GM/100ML-% IV SOLN
2.0000 g | Freq: Once | INTRAVENOUS | Status: AC
Start: 2023-03-27 — End: 2023-03-27
  Administered 2023-03-27: 2 g via INTRAVENOUS
  Filled 2023-03-27: qty 100

## 2023-03-27 MED ORDER — ONDANSETRON HCL 4 MG/2ML IJ SOLN
4.0000 mg | Freq: Four times a day (QID) | INTRAMUSCULAR | Status: DC | PRN
  Administered 2023-04-17: 4 mg via INTRAVENOUS
  Filled 2023-03-27: qty 2

## 2023-03-27 MED ORDER — ACETAMINOPHEN 325 MG PO TABS
650.0000 mg | ORAL_TABLET | Freq: Four times a day (QID) | ORAL | Status: DC | PRN
Start: 2023-03-27 — End: 2023-05-10
  Administered 2023-04-06 – 2023-05-01 (×6): 650 mg via ORAL
  Filled 2023-03-27 (×6): qty 2

## 2023-03-27 MED ORDER — ACETAMINOPHEN 650 MG RE SUPP: 650.0000 mg | Freq: Four times a day (QID) | RECTAL | Status: DC | PRN

## 2023-03-27 MED ORDER — METRONIDAZOLE 500 MG/100ML IV SOLN
500.0000 mg | Freq: Two times a day (BID) | INTRAVENOUS | Status: DC
  Administered 2023-03-28: 500 mg via INTRAVENOUS
  Filled 2023-03-27: qty 100

## 2023-03-27 MED ORDER — SODIUM CHLORIDE 0.9 % IV SOLN
2.0000 g | Freq: Two times a day (BID) | INTRAVENOUS | Status: DC
Start: 2023-03-27 — End: 2023-04-17
  Administered 2023-03-28 – 2023-04-17 (×41): 2 g via INTRAVENOUS
  Filled 2023-03-27 (×41): qty 12.5

## 2023-03-27 NOTE — ED Triage Notes (Signed)
 Pt arrives from home via [facility] with reports cough x 3 days. Does report low grade fever at home. Pt also reports he has post surgical wound on left foot that is not responding to PO antibiotics and was told to come to ER for further evaluation.

## 2023-03-27 NOTE — ED Notes (Signed)
 Call made to [facility] to make aware pt is going to be transported upstairs.

## 2023-03-27 NOTE — ED Provider Notes (Signed)
 [place] EMERGENCY DEPARTMENT AT [facility] Provider Note   CSN: [id number] Arrival date & time: 03/27/23  1825     History  Chief Complaint  Patient presents with   Cough    Harold [name] is a 68 y.o. male.  HPI    68 year old patient comes in with chief complaint of cough, weakness, poor oral intake and worsening wound to the right leg.  Patient has history of hypertension, hyperlipidemia, coronary artery disease status post PCI, A-fib on Eliquis , COPD, diabetes, PAD status post stents.   Patient had right ankle fracture and required ORIF on 11-14.  Subsequently patient had fracture of the pins, and required revision in December.  Currently patient resides at home.  He started feeling sick about 5 days ago.  He has a cough, that is mostly dry.  He has generalized weakness, subjective fevers with chills and nausea.  He also has wound care team coming home to help manage his lower extremity wound.  Over the last 3 to 5 days, there has been increasing swelling, redness and a new ulcer has showed up over the right lateral malleoli region.  There is also some drainage.  The wound care team advised that he go to the ER for further assessment.  Patient was on antibiotics post his revision.  Home Medications Prior to Admission medications   Medication Sig Start Date End Date Taking? Authorizing Provider  acetaminophen  (TYLENOL ) 500 MG tablet Take 1 tablet (500 mg total) by mouth every 6 (six) hours as needed for mild pain (pain score 1-3). 01/07/23  Yes Hongalgi, Anand D, MD  allopurinol  (ZYLOPRIM ) 100 MG tablet Take 1 tablet (100 mg total) by mouth daily. Patient taking differently: Take 100 mg by mouth 2 (two) times daily. 01/07/23  Yes Hongalgi, Anand D, MD  amiodarone  (PACERONE ) 200 MG tablet Take 1 tablet (200 mg total) by mouth daily. Patient taking differently: Take 200 mg by mouth 2 (two) times daily. 07/11/22  Yes Fenton, Clint R, PA  apixaban  (ELIQUIS ) 5 MG  TABS tablet TAKE 1 TABLET BY MOUTH TWICE A DAY 02/17/23  Yes Anner Alm ORN, MD  carvedilol  (COREG ) 3.125 MG tablet TAKE 1 TABLET BY MOUTH TWICE A DAY WITH A MEAL 12/13/22  Yes Dunn, Dayna N, PA-C  clopidogrel  (PLAVIX ) 75 MG tablet Take 75 mg by mouth every morning. 02/04/23  Yes [provider]  colchicine  0.6 MG tablet Take 0.6 mg by mouth daily.   Yes [provider]  Continuous Blood Gluc Sensor (FREESTYLE LIBRE 14 DAY SENSOR) MISC APPLY 1 SENSOR EVERY 14 DAYS 08/25/17  Yes [provider]  DULoxetine  (CYMBALTA ) 60 MG capsule Take 60 mg by mouth daily.   Yes [provider]  ezetimibe  (ZETIA ) 10 MG tablet Take 10 mg by mouth daily. 05/09/22  Yes [provider]  furosemide  (LASIX ) 80 MG tablet Take 0.5 tablets (40 mg total) by mouth daily. May take additional tablet for weight gain of 3 pounds in one day or five pounds in one week Patient taking differently: Take 160 mg by mouth 2 (two) times daily. May take additional tablet for weight gain of 3 pounds in one day or five pounds in one week 01/14/23  Yes [place], Eric J, DO  gabapentin  (NEURONTIN ) 800 MG tablet Take 1,600 mg by mouth 3 (three) times daily. 02/13/23  Yes [provider]  HUMULIN  R U-500 KWIKPEN 500 UNIT/ML KwikPen Inject 90-180 Units into the skin in the morning and at bedtime.  02/18/23  Yes [provider]  levothyroxine  (SYNTHROID , LEVOTHROID) 88 MCG tablet Take 88 mcg by mouth daily before breakfast.   Yes [provider]  nitroGLYCERIN  (NITROSTAT ) 0.4 MG SL tablet PLACE 1 TAB UNDER THE TONGUE EVERY 5 MINUTES AS NEEDED FOR CHEST PAIN (SEVERE PRESSURE OR TIGHTNESS) 02/22/19  Yes Anner Alm ORN, MD  pantoprazole  (PROTONIX ) 40 MG tablet TAKE 1 TABLET BY MOUTH EVERY DAY 12/13/22  Yes Dunn, Dayna N, PA-C  potassium chloride  (MICRO-K ) 10 MEQ CR capsule Take 10 mEq by mouth daily. 02/26/23  Yes [provider]  rosuvastatin  (CRESTOR ) 20 MG tablet Take 20 mg  by mouth every evening.   Yes [provider]  SODIUM FLUORIDE 5000 PPM 1.1 % PSTE Take 1 Application by mouth in the morning and at bedtime. 02/17/23  Yes [provider]  tirzepatide  (MOUNJARO ) 15 MG/0.5ML Pen Inject 15 mg into the skin once a week. Patient taking differently: Inject 15 mg into the skin once a week. On Tuesdays 12/30/22  Yes   traZODone  (DESYREL ) 100 MG tablet Take 100 mg by mouth at bedtime. 03/04/23  Yes [provider]  TRESIBA  FLEXTOUCH 200 UNIT/ML FlexTouch Pen Inject 60 Units into the skin daily. 03/22/23  Yes [provider]  B-D ULTRAFINE III SHORT PEN 31G X 8 MM MISC Inject 1 each into the skin 3 (three) times daily. 11/30/19   [provider]  fluticasone  (FLONASE ) 50 MCG/ACT nasal spray Place 2 sprays into both nostrils daily. Patient not taking: Reported on 03/27/2023 01/15/23   [place], Camellia PARAS, DO  insulin  aspart (NOVOLOG ) 100 UNIT/ML injection Inject 8 Units into the skin 3 (three) times daily with meals. Patient not taking: Reported on 03/27/2023 01/14/23   [place], Camellia PARAS, DO  insulin  glargine-yfgn (SEMGLEE ) 100 UNIT/ML injection Inject 0.4 mLs (40 Units total) into the skin daily. Patient not taking: Reported on 03/27/2023 01/14/23   [place], Camellia PARAS, DO  loratadine  (CLARITIN ) 10 MG tablet Take 1 tablet (10 mg total) by mouth daily. 01/15/23   [place], Camellia PARAS, DO  Multiple Vitamin (MULTIVITAMIN WITH MINERALS) TABS tablet Take 1 tablet by mouth daily. 01/08/23   Hongalgi, Anand D, MD  mupirocin  ointment (BACTROBAN ) 2 % Apply 1 Application topically 2 (two) times daily as needed (wound care). Patient not taking: Reported on 03/27/2023 11/21/22   Silva Juliene SAUNDERS, DPM  oxyCODONE  (OXY IR/ROXICODONE ) 5 MG immediate release tablet Take 1 tablet (5 mg total) by mouth every 4 (four) hours as needed for moderate pain (pain score 4-6). Patient not taking: Reported on 03/27/2023 01/14/23   [place], Eric J, DO  PRESCRIPTION MEDICATION Pt uses  CPAP Machine at bedtime    [provider]      Allergies    Doxycycline , Septra [bactrim], Penicillins, Metformin  and related, Other, Sulfamethoxazole-trimethoprim, and Wellbutrin [bupropion]    Review of Systems   Review of Systems  All other systems reviewed and are negative.   Physical Exam Updated Vital Signs BP (!) 185/74   Pulse 68   Temp 98.7 F (37.1 C) (Oral)   Resp (!) 21   SpO2 100%  Physical Exam Vitals and nursing note reviewed.  Constitutional:      Appearance: He is well-developed.  HENT:     Head: Atraumatic.  Cardiovascular:     Rate and Rhythm: Normal rate.  Pulmonary:     Effort: Pulmonary effort is normal.  Musculoskeletal:        General: Swelling and tenderness present.  Cervical back: Neck supple.     Comments: Right lower extremity is edematous, erythematous and there is mild warmth to touch.  Patient has an ulcer over the lateral malleoli that is draining serosanguineous fluid  Skin:    General: Skin is warm.     Findings: Erythema present.  Neurological:     Mental Status: He is alert and oriented to person, place, and time.            ED Results / Procedures / Treatments   Labs (all labs ordered are listed, but only abnormal results are displayed) Labs Reviewed  RESP PANEL BY RT-PCR (RSV, FLU A&B, COVID)  RVPGX2 - Abnormal; Notable for the following components:      Result Value   Influenza A by PCR POSITIVE (*)    All other components within normal limits  COMPREHENSIVE METABOLIC PANEL - Abnormal; Notable for the following components:   Glucose, Bld 241 (*)    Creatinine, Ser 2.73 (*)    Calcium  8.8 (*)    Total Protein 6.1 (*)    Albumin  2.3 (*)    GFR, Estimated 24 (*)    All other components within normal limits  CBC WITH DIFFERENTIAL/PLATELET  MAGNESIUM   CBC WITH DIFFERENTIAL/PLATELET  COMPREHENSIVE METABOLIC PANEL  MAGNESIUM   PROTIME-INR    EKG None  Radiology DG Tibia/Fibula Right Result Date:  03/27/2023 CLINICAL DATA:  Evaluate for gas, deep wound noted. EXAM: RIGHT TIBIA AND FIBULA - 2 VIEW COMPARISON:  01/28/2023. FINDINGS: There is no evidence of acute fracture or other focal bone lesions. Fixation hardware is noted in the distal tibia and fibula without evidence of hardware loosening. Vascular calcifications are present in the soft tissues. Soft tissue swelling is noted most pronounced of the medial malleolus. No subcutaneous emphysema is seen. IMPRESSION: 1. Soft tissue swelling most pronounced over the lateral malleolus. No subcutaneous emphysema is seen. 2. No acute osseous abnormality. Electronically Signed   By: Leita Birmingham M.D.   On: 03/27/2023 21:17   DG Chest 2 View Result Date: 03/27/2023 CLINICAL DATA:  Cough and fever for 3 days. EXAM: CHEST - 2 VIEW COMPARISON:  01/02/2023 FINDINGS: The heart size and mediastinal contours are within normal limits. Both lungs are clear. The visualized skeletal structures are unremarkable. IMPRESSION: No active cardiopulmonary disease. Electronically Signed   By: Norleen DELENA Kil M.D.   On: 03/27/2023 18:57    Procedures Procedures    Medications Ordered in ED Medications  acetaminophen  (TYLENOL ) tablet 650 mg (has no administration in time range)    Or  acetaminophen  (TYLENOL ) suppository 650 mg (has no administration in time range)  melatonin tablet 3 mg (has no administration in time range)  ondansetron  (ZOFRAN ) injection 4 mg (has no administration in time range)  benzonatate  (TESSALON ) capsule 200 mg (has no administration in time range)  guaiFENesin  (MUCINEX ) 12 hr tablet 600 mg (has no administration in time range)  metroNIDAZOLE  (FLAGYL ) IVPB 500 mg (has no administration in time range)  naloxone  (NARCAN ) injection 0.4 mg (has no administration in time range)  fentaNYL  (SUBLIMAZE ) injection 50 mcg (has no administration in time range)  ceFEPIme  (MAXIPIME ) 2 g in sodium chloride  0.9 % 100 mL IVPB (has no administration in time  range)  vancomycin  (VANCOCIN ) 2,500 mg in sodium chloride  0.9 % 500 mL IVPB (has no administration in time range)    Followed by  vancomycin  (VANCOREADY) IVPB 2000 mg/400 mL (has no administration in time range)  benzonatate  (TESSALON ) capsule 200 mg (200 mg  Oral Given 03/27/23 2125)  ceFAZolin  (ANCEF ) IVPB 2g/100 mL premix (0 g Intravenous Stopped 03/27/23 2219)    ED Course/ Medical Decision Making/ A&P                                 Medical Decision Making Amount and/or Complexity of Data Reviewed Labs: ordered. Radiology: ordered.  Risk Prescription drug management. Decision regarding hospitalization.   This patient presents to the ED with chief complaint(s) of cough, generalized weakness, nausea, malaise along with worsening wound over his right lower extremity with pertinent past medical history of CAD, PAD, diabetes, ORIF with subsequent complication of pin fracture requiring revision.The complaint involves an extensive differential diagnosis and also carries with it a high risk of complications and morbidity.    The differential diagnosis includes : Pneumonia, influenza, electrolyte abnormality, acute on chronic renal failure, CHF, pulmonary edema, pleural effusion,   Cellulitis, mild myositis, infected hardware, osteomyelitis  Initial plan was to get basic labs, x-ray of the lower extremity. The lower extremity wound appears to have emerged over the last 4 to 5 days.  Timeline makes me less suspicious for deep space infection such as osteomyelitis, but hardware infection still a possibility -and through that osteomyelitis remains as a possibility.    Additional history obtained: Additional history obtained from family Records reviewed previous admission documents  Independent labs interpretation:  The following labs were independently interpreted: Patient is flu positive.  CBC is reassuring.  Independent visualization and interpretation of imaging: - I independently  visualized the following imaging with scope of interpretation limited to determining acute life threatening conditions related to emergency care: X-ray of the foot, which revealed no evidence of free air.  Treatment and Reassessment: Results of the ER workup discussed with the patient. We will admit the patient for flu, acute on chronic renal failure, generalized weakness and also foot infection.  I discussed the case with Dr. Layman orthopedic team.  The orthopedic surgeon requested that we keep patient n.p.o. after midnight.  They will assess the patient tomorrow to see if he needs washout.  Medicine to admit.  Clinically patient is not septic.  Final Clinical Impression(s) / ED Diagnoses Final diagnoses:  Influenza A  Cellulitis and abscess of foot  Acute renal failure superimposed on chronic kidney disease, unspecified acute renal failure type, unspecified CKD stage [facility])    Rx / DC Orders ED Discharge Orders     None         Charlyn Sora, MD 03/27/23 2329

## 2023-03-27 NOTE — H&P (Signed)
 History and Physical      Prophet Renwick Poirier FMW:[id number] DOB: [birth year] DOA: 03/27/2023; DOS: 03/27/2023  PCP: Caleen Dirks, MD  Patient coming from: home   I have personally briefly reviewed patient's old medical records in [facility]  Chief Complaint: Cough  HPI: Harold [name] is a 68 y.o. male with medical history significant for paroxysmal atrial fibrillation chronically anticoagulated on Eliquis , type 2 diabetes mellitus, essential hypertension, stage IV CKD with baseline creatinine 2.1-2.6, who is admitted to [facility] on 03/27/2023 with influenza A after presenting from home to [facility] ED complaining of cough.   The patient reports 5 days of new onset productive cough associate with subjective fever and generalized myalgias.  Denies any associated shortness of breath, orthopnea, or PND.  No associated any wheezing, chest pain, palpitations, diaphoresis, dizziness, presyncope, or syncope.  Over the last 5 days, he also notes onset generalized weakness in the absence of any acute focal weakness.  Denies any recent dysuria or gross hematuria.  In November 2024, he underwent ORIF of the right ankle, with ensuing revision in December 2024.  He has subsequently been followed by wound care at home.  However, over the course of the last week, he has noted increase in size of the wound over the lateral aspect of the right ankle, associate with some purulent drainage and increase in surrounding erythema, tenderness, swelling.  He conveys that home health wound care also noted worsening of this right lateral malleolus wound and surrounding erythema.  He has a history of diabetic peripheral polyneuropathy, but within this context, denies any acute focal numbness or paresthesias.  Denies any acute focal weakness.     ED Course:  Vital signs in the ED were notable for the following: Afebrile; heart's in the 60s to 70s; systolic pressures in the 150s to 180s; respiratory rate 18-21,  oxygen saturation 99-1 high percent on room air.  Labs were notable for the following: CMP notable for the following: Creatinine 2.73, glucose 241, calcium  adjusted for mild hypoalbuminemia noted to be 10.1, albumin  2.3.  Otherwise, liver enzymes are within normal limits.  CBC notable for white cell count 7400, hemoglobin 14.1.  Influenza A a PCR is positive, will COVID, influenza B and RSV PCR were all negative.  Per my interpretation, EKG in ED demonstrated the following: No EKG performed in the ED today.  Imaging in the ED, per corresponding formal radiology read, was notable for the following: 2 view chest x-ray showed no evidence of acute cardiopulmonary process, including no evidence of infiltrate, Naima, effusion, or pneumothorax.  They films of the right tib-fib showed soft tissue swelling most pronounced over the lateral malleolus, all demonstrating no evidence of subcutaneous gas and no evidence of acute bony abnormality.  EDP d/w on-call ortho from [facility] (Dr. Celena), who conveyed that the [facility] will formally consult and will see patient in the morning. Dr. Celena requests that patient be made NPO at [facility] in case it is determined that a procedure is necessary.  While in the ED, the following were administered: Tessalon  Perles 200 mg p.o. x 1 dose, Ancef  2 g IV x 1 dose.  Subsequently, the patient was admitted for further evaluation management of presenting influenza A infection as well as cellulitis of the right foot in the setting of concern for infected right diabetic foot wound.     Review of Systems: As per HPI otherwise 10 point review of systems negative.   Past Medical History:  Diagnosis Date   Anemia    Anginal pain (HCC) 04/2013   Cardiac cath showed patent stents with distal LAD, circumflex-OM and RCA disease in small vessels.   Anxiety    Atrial fibrillation (HCC) 12/2021   CAD S/P percutaneous coronary angioplasty 2003, 04/2012   status post  PCI to LAD, circumflex-OM 2, RCA   Carotid artery occlusion    Chronic renal insufficiency, stage II (mild)    Chronic venous insufficiency    varicosities, no reflux; dopplers 04/14/12- valvular insufficiency in the R and L GSV   Complication of anesthesia    COPD, mild (HCC)    Depression    situaltional    Diabetes mellitus    Diabetic neuropathy (HCC) 08/24/2019   Dyslipidemia associated with type 2 diabetes mellitus (HCC)    GERD (gastroesophageal reflux disease)    History of cardioversion 12/2021   at [facility]   HTN (hypertension)    Hypothyroidism    Neuromuscular disorder (HCC)    neuropathy in feet   Neuropathy    notably improved following PCI with improved cardiac function   Obesity    Obesity, Class II, BMI 35.0-39.9, with comorbidity (see actual BMI)    BMI 39; wgt loss efforts in place; seeing Dietician   Orthostatic hypotension    PONV (postoperative nausea and vomiting)    [place]   Pulmonary hypertension (HCC) 04/2012   PA pressure   Sleep apnea    uses nightly   Vitamin D  deficiency    per facility notes    Past Surgical History:  Procedure Laterality Date   ABDOMINAL AORTAGRAM N/A 11/15/2011   Procedure: ABDOMINAL EZELLA;  Surgeon: Carlin FORBES Haddock, MD;  Location: [facility] CATH LAB;  Service: Cardiovascular;  [name];   ABDOMINAL AORTOGRAM W/LOWER EXTREMITY N/A 10/13/2019   Procedure: ABDOMINAL AORTOGRAM W/LOWER EXTREMITY;  Surgeon: Darron Deatrice LABOR, MD;  Location: [facility] INVASIVE CV LAB;  Service: CV: Non-obst Ao-Iliac. R SFA-PopA patent with significant calcific stenosis of TP trunk-prox PTA (dominant) & CTO ATA  -> R PTA-TP PTCA   ABDOMINAL AORTOGRAM W/LOWER EXTREMITY N/A 11/06/2022   Procedure: ABDOMINAL AORTOGRAM W/LOWER EXTREMITY;  Surgeon: Darron Deatrice LABOR, MD;  Location: [facility] INVASIVE CV LAB;  Service: Cardiovascular;  [name];   ABIs  04/27/2012   mild bilateral arterial insufficiency   BACK SURGERY  2005 x1   X2-2010    BUNIONECTOMY Right 11/11/2013   Procedure: RIGHT FOOT SILVER BUNIONECTOMY;  Surgeon: Norleen Armor, MD;  Location: [facility];  Service: Orthopedics;  Laterality: Right;   CARDIAC CATHETERIZATION  12/11/2001   significant 3V CAD, normal LV function   CARDIOVERSION N/A 03/13/2022   Procedure: CARDIOVERSION;  Surgeon: Kate Lonni CROME, MD;  Location: [facility] ENDOSCOPY;  Service: Cardiovascular;  [name];   CARDIOVERSION N/A 07/25/2022   Procedure: CARDIOVERSION;  Surgeon: Jeffrie Oneil BROCKS, MD;  Location: [facility] INVASIVE CV LAB;  Service: Cardiovascular;  [name];   Carotid Doppler  04/27/2012   right internal carotid: Elevated velocities but no evidence of plaque. Left internal carotid 40-59%   CAROTID ENDARTERECTOMY  2005   Right; recent carotid Dopplers notes elevated velocities.    colonscopy     CORONARY ANGIOPLASTY  12/21/2001   PTCA of the distal and mid AV groove circ, unsuccessful PTCA of second OM total occlusion, unsuccessful PTCA of the apical LAD total occlusion   CORONARY ANGIOPLASTY WITH STENT PLACEMENT  04/29/2012   PCI to 3 RCA lesions, Promus Premiere 2.49mmx8mm distally,  mid was 2.71mx28mm and proximally 2.75x60mm, EF 55-60%   CORONARY STENT PLACEMENT  04/28/2012   PCI to LAD (3x50mm Xience DES postdilated to 3.25) and circ prox and mid (2 overlappinmg 2.41mmx12mmXience DES postdilated to 2.36mm)   DOPPLER ECHOCARDIOGRAPHY  04/28/2012   poor quality study: EF estimated 60-65%; unable to assess diastolic function (previously noted to have diastolic dysfunction); severely dilated left atrium and mild right atrium; dilated IVC consistent with increased central venous pressure.SABRA   HARDWARE REMOVAL Right 01/28/2023   Procedure: REMOVAL OF HARDWARE RIGHT ANKLE;  [name], MD;  [name];  Service: Orthopedics;  Laterality: Right;   HERNIA REPAIR     I & D EXTREMITY Right 01/02/2023   Procedure: IRRIGATION AND DEBRIDEMENT RIGHT ANKLE;   [name], MD;  [name];  Service: Orthopedics;  Laterality: Right;   LEFT AND RIGHT HEART CATHETERIZATION WITH CORONARY ANGIOGRAM N/A 04/27/2012   Procedure: LEFT AND RIGHT HEART CATHETERIZATION WITH CORONARY ANGIOGRAM;  [name], MD;  Location: [facility] CATH LAB;  Service: Cardiovascular;  [name];   LEFT AND RIGHT HEART CATHETERIZATION WITH CORONARY ANGIOGRAM N/A 05/14/2013   Procedure: LEFT AND RIGHT HEART CATHETERIZATION WITH CORONARY ANGIOGRAM;  [name], MD;  [name]; PCWP ;; [name], [name], Patent RCA ostial mid and distal stents - 70% distal runoff Dz   LUMBAR LAMINECTOMY/DECOMPRESSION MICRODISCECTOMY  01/07/2012   Procedure: LUMBAR LAMINECTOMY/DECOMPRESSION MICRODISCECTOMY 1 LEVEL;  [name], MD;  [name];  Service: Neurosurgery;  Laterality: Bilateral;   Lumbar Three-Four Decompression   LUMBAR LAMINECTOMY/DECOMPRESSION MICRODISCECTOMY N/A 07/26/2020   Procedure: Lumbar two-three Laminectomy/Foraminotomy;  [name], MD;  [name];  Service: Neurosurgery;  [name];   ORIF ANKLE FRACTURE Right 01/02/2023   Procedure: OPEN REDUCTION INTERNAL FIXATION (ORIF)RIGHT ANKLE FRACTURE;  [name], MD;  [name];  Service: Orthopedics;  Laterality: Right;   PERCUTANEOUS CORONARY STENT INTERVENTION (PCI-S) N/A 04/28/2012   Procedure: PERCUTANEOUS CORONARY STENT INTERVENTION (PCI-S);  [name], MD;  Location: [facility] CATH LAB;  Service: Cardiovascular;  [name];   PERCUTANEOUS CORONARY STENT INTERVENTION (PCI-S) N/A 04/29/2012   Procedure: PERCUTANEOUS CORONARY STENT INTERVENTION (PCI-S);  [name], MD;  Location: [facility] CATH LAB;  Service: Cardiovascular;  [name];   PERIPHERAL  VASCULAR ATHERECTOMY  11/06/2022   Procedure: PERIPHERAL VASCULAR ATHERECTOMY;  Surgeon: Darron Deatrice DELENA, MD;  Location: [facility] INVASIVE CV LAB;  Service: Cardiovascular;;   PERIPHERAL VASCULAR BALLOON ANGIOPLASTY Right 10/13/2019   Procedure: PERIPHERAL VASCULAR BALLOON ANGIOPLASTY;  Surgeon: Darron Deatrice DELENA, MD;  Location: [facility] INVASIVE CV LAB;  Service: Cardiovascular;  Laterality: Right;  PTCA of R TP Trunk into PTA (Drug-coated) => Follow-up ABIs 0.9 on the right and 0.99 on the left.   SPINE SURGERY     UMBILICAL HERNIA REPAIR  2009   steel mesh insert    Social History:  reports that he quit smoking about 20 years ago. His smoking use included cigarettes. He started smoking about 62 years ago. He has a 42 pack-year smoking history. He has never used smokeless tobacco. He reports that he does not drink alcohol  and does not use drugs.   Allergies  Allergen Reactions   Doxycycline  Hives and Rash   Septra [Bactrim] Itching  Penicillins Rash    Allergy to All cillin drugs  Ancef  given 9/24 with no obvious reaction   Metformin  And Related Diarrhea and Nausea Only   Other Nausea And Vomiting    General Anesthesia - sometimes causes nausea and vomiting * OK to use scopolamine  patch     Sulfamethoxazole-Trimethoprim Other (See Comments)   Wellbutrin [Bupropion]     Mood Changes    Family History  Adopted: Yes  Family history unknown: Yes    Family history reviewed and not pertinent    Prior to Admission medications   Medication Sig Start Date End Date Taking? Authorizing Provider  acetaminophen  (TYLENOL ) 500 MG tablet Take 1 tablet (500 mg total) by mouth every 6 (six) hours as needed for mild pain (pain score 1-3). 01/07/23  Yes Hongalgi, Anand D, MD  allopurinol  (ZYLOPRIM ) 100 MG tablet Take 1 tablet (100 mg total) by mouth daily. Patient taking differently: Take 100 mg by mouth 2 (two) times daily. 01/07/23  Yes Hongalgi, Anand D, MD  amiodarone  (PACERONE ) 200 MG tablet Take 1  tablet (200 mg total) by mouth daily. Patient taking differently: Take 200 mg by mouth 2 (two) times daily. 07/11/22  Yes Fenton, Clint R, PA  apixaban  (ELIQUIS ) 5 MG TABS tablet TAKE 1 TABLET BY MOUTH TWICE A DAY 02/17/23  Yes Anner Alm ORN, MD  carvedilol  (COREG ) 3.125 MG tablet TAKE 1 TABLET BY MOUTH TWICE A DAY WITH A MEAL 12/13/22  Yes Dunn, Dayna N, PA-C  clopidogrel  (PLAVIX ) 75 MG tablet Take 75 mg by mouth every morning. 02/04/23  Yes [provider]  colchicine  0.6 MG tablet Take 0.6 mg by mouth daily.   Yes [provider]  Continuous Blood Gluc Sensor (FREESTYLE LIBRE 14 DAY SENSOR) MISC APPLY 1 SENSOR EVERY 14 DAYS 08/25/17  Yes [provider]  DULoxetine  (CYMBALTA ) 60 MG capsule Take 60 mg by mouth daily.   Yes [provider]  ezetimibe  (ZETIA ) 10 MG tablet Take 10 mg by mouth daily. 05/09/22  Yes [provider]  furosemide  (LASIX ) 80 MG tablet Take 0.5 tablets (40 mg total) by mouth daily. May take additional tablet for weight gain of 3 pounds in one day or five pounds in one week Patient taking differently: Take 160 mg by mouth 2 (two) times daily. May take additional tablet for weight gain of 3 pounds in one day or five pounds in one week 01/14/23  Yes Austria, Eric J, DO  gabapentin  (NEURONTIN ) 800 MG tablet Take 1,600 mg by mouth 3 (three) times daily. 02/13/23  Yes [provider]  HUMULIN  R U-500 KWIKPEN 500 UNIT/ML KwikPen Inject 90-180 Units into the skin in the morning and at bedtime. 02/18/23  Yes [provider]  levothyroxine  (SYNTHROID , LEVOTHROID) 88 MCG tablet Take 88 mcg by mouth daily before breakfast.   Yes [provider]  nitroGLYCERIN  (NITROSTAT ) 0.4 MG SL tablet PLACE 1 TAB UNDER THE TONGUE EVERY 5 MINUTES AS NEEDED FOR CHEST PAIN (SEVERE PRESSURE OR TIGHTNESS) 02/22/19  Yes Anner Alm ORN, MD  pantoprazole  (PROTONIX ) 40 MG tablet TAKE 1 TABLET BY MOUTH EVERY DAY 12/13/22  Yes Dunn, Dayna N,  PA-C  potassium chloride  (MICRO-K ) 10 MEQ CR capsule Take 10 mEq by mouth daily. 02/26/23  Yes [provider]  rosuvastatin  (CRESTOR ) 20 MG tablet Take 20 mg by mouth every evening.   Yes [provider]  SODIUM FLUORIDE 5000 PPM 1.1 % PSTE Take 1 Application by mouth in the morning and  at bedtime. 02/17/23  Yes [provider]  tirzepatide  (MOUNJARO ) 15 MG/0.5ML Pen Inject 15 mg into the skin once a week. Patient taking differently: Inject 15 mg into the skin once a week. On Tuesdays 12/30/22  Yes   traZODone  (DESYREL ) 100 MG tablet Take 100 mg by mouth at bedtime. 03/04/23  Yes [provider]  TRESIBA  FLEXTOUCH 200 UNIT/ML FlexTouch Pen Inject 60 Units into the skin daily. 03/22/23  Yes [provider]  B-D ULTRAFINE III SHORT PEN 31G X 8 MM MISC Inject 1 each into the skin 3 (three) times daily. 11/30/19   [provider]  fluticasone  (FLONASE ) 50 MCG/ACT nasal spray Place 2 sprays into both nostrils daily. Patient not taking: Reported on 03/27/2023 01/15/23   Austria, Camellia PARAS, DO  insulin  aspart (NOVOLOG ) 100 UNIT/ML injection Inject 8 Units into the skin 3 (three) times daily with meals. Patient not taking: Reported on 03/27/2023 01/14/23   [place], Camellia PARAS, DO  insulin  glargine-yfgn (SEMGLEE ) 100 UNIT/ML injection Inject 0.4 mLs (40 Units total) into the skin daily. Patient not taking: Reported on 03/27/2023 01/14/23   [place], Camellia PARAS, DO  loratadine  (CLARITIN ) 10 MG tablet Take 1 tablet (10 mg total) by mouth daily. 01/15/23   [place], Camellia PARAS, DO  Multiple Vitamin (MULTIVITAMIN WITH MINERALS) TABS tablet Take 1 tablet by mouth daily. 01/08/23   Hongalgi, Anand D, MD  mupirocin  ointment (BACTROBAN ) 2 % Apply 1 Application topically 2 (two) times daily as needed (wound care). Patient not taking: Reported on 03/27/2023 11/21/22   Silva Juliene SAUNDERS, DPM  oxyCODONE  (OXY IR/ROXICODONE ) 5 MG immediate release tablet Take 1 tablet (5 mg total) by mouth every 4  (four) hours as needed for moderate pain (pain score 4-6). Patient not taking: Reported on 03/27/2023 01/14/23   [place]ria, Eric J, DO  PRESCRIPTION MEDICATION Pt uses CPAP Machine at bedtime    [provider]     Objective    Physical Exam: Vitals:   03/27/23 1831 03/27/23 2145  BP: (!) 155/82 (!) 191/80  Pulse: 76 75  Resp: 18 19  Temp: 98.6 F (37 C)   TempSrc: Axillary   SpO2: 99% 100%    General: appears to be stated age; alert, oriented Skin: warm, wound over the lateral malleolus of the right lower extremity some purulent drainage and surrounding erythema/swelling.  Not associate any crepitus. head:  AT/[place] Mouth:  Oral mucosa membranes appear dry, normal dentition Neck: supple; trachea midline Heart:  RRR; did not appreciate any M/R/G Lungs: CTAB, did not appreciate any wheezes, rales, or rhonchi Abdomen: + BS; soft, ND, NT Vascular: 2+ pedal pulses b/l; 2+ radial pulses b/l Extremities: Right lower extremity erythema, swelling, as well as wound over the right lateral malleolus, as further outlined above Neuro: strength and sensation intact in upper and lower extremities b/l     Labs on Admission: I have personally reviewed following labs and imaging studies  CBC: Recent Labs  Lab 03/27/23 1832  WBC 7.4  NEUTROABS 5.6  HGB 14.1  HCT 40.1  MCV 85.0  PLT 189   Basic Metabolic Panel: Recent Labs  Lab 03/27/23 1832  NA 135  K 3.5  CL 99  CO2 22  GLUCOSE 241*  BUN 22  CREATININE 2.73*  CALCIUM  8.8*   GFR: CrCl cannot be calculated (Unknown ideal weight.). Liver Function Tests: Recent Labs  Lab 03/27/23 1832  AST 21  ALT 11  ALKPHOS 77  BILITOT 0.8  PROT 6.1*  ALBUMIN   2.3*   No results for input(s): LIPASE, AMYLASE in the last 168 hours. No results for input(s): AMMONIA in the last 168 hours. Coagulation Profile: No results for input(s): INR, PROTIME in the last 168 hours. Cardiac Enzymes: No results for input(s):  CKTOTAL, CKMB, CKMBINDEX, TROPONINI in the last 168 hours. BNP (last 3 results) No results for input(s): PROBNP in the last 8760 hours. HbA1C: No results for input(s): HGBA1C in the last 72 hours. CBG: No results for input(s): GLUCAP in the last 168 hours. Lipid Profile: No results for input(s): CHOL, HDL, LDLCALC, TRIG, CHOLHDL, LDLDIRECT in the last 72 hours. Thyroid  Function Tests: No results for input(s): TSH, T4TOTAL, FREET4, T3FREE, THYROIDAB in the last 72 hours. Anemia Panel: No results for input(s): VITAMINB12, FOLATE, FERRITIN, TIBC, IRON, RETICCTPCT in the last 72 hours. Urine analysis:    Component Value Date/Time   COLORURINE YELLOW 01/02/2023 0812   APPEARANCEUR HAZY (A) 01/02/2023 0812   LABSPEC 1.017 01/02/2023 0812   PHURINE 5.0 01/02/2023 0812   GLUCOSEU >=500 (A) 01/02/2023 0812   HGBUR MODERATE (A) 01/02/2023 0812   BILIRUBINUR NEGATIVE 01/02/2023 0812   BILIRUBINUR negative 05/12/2018 1213   BILIRUBINUR small 06/29/2014 0859   KETONESUR 5 (A) 01/02/2023 0812   PROTEINUR >=300 (A) 01/02/2023 0812   UROBILINOGEN 0.2 05/12/2018 1213   UROBILINOGEN 1.0 05/02/2014 2015   NITRITE NEGATIVE 01/02/2023 0812   LEUKOCYTESUR NEGATIVE 01/02/2023 0812    Radiological Exams on Admission: DG Tibia/Fibula Right Result Date: 03/27/2023 CLINICAL DATA:  Evaluate for gas, deep wound noted. EXAM: RIGHT TIBIA AND FIBULA - 2 VIEW COMPARISON:  01/28/2023. FINDINGS: There is no evidence of acute fracture or other focal bone lesions. Fixation hardware is noted in the distal tibia and fibula without evidence of hardware loosening. Vascular calcifications are present in the soft tissues. Soft tissue swelling is noted most pronounced of the medial malleolus. No subcutaneous emphysema is seen. IMPRESSION: 1. Soft tissue swelling most pronounced over the lateral malleolus. No subcutaneous emphysema is seen. 2. No acute osseous abnormality.  Electronically Signed   By: Leita Birmingham M.D.   On: 03/27/2023 21:17   DG Chest 2 View Result Date: 03/27/2023 CLINICAL DATA:  Cough and fever for 3 days. EXAM: CHEST - 2 VIEW COMPARISON:  01/02/2023 FINDINGS: The heart size and mediastinal contours are within normal limits. Both lungs are clear. The visualized skeletal structures are unremarkable. IMPRESSION: No active cardiopulmonary disease. Electronically Signed   By: Norleen DELENA Kil M.D.   On: 03/27/2023 18:57      Assessment/Plan   Principal Problem:   Influenza A Active Problems:   Generalized weakness   Paroxysmal atrial fibrillation (HCC)   Essential hypertension   Acquired hypothyroidism   OSA on CPAP   Cellulitis of right lower extremity   CKD (chronic kidney disease) stage 4, GFR 15-29 ml/min (HCC)     #) Influenza A infection: Diagnosis on the basis of 5 days of new onset productive cough, subjective fever, generalized myalgias, with finding of positive  influenza A PCR in the ED today.  Chest x-ray shows no evidence of acute cardiopulmonary process, including no evidence of infiltrate.  In the absence of fever and in the absence of leukocytosis, SIRS criteria are not met for sepsis at this time.  He appears hemodynamically stable.  As he is outside of the 48-hour window for initiation of Tamiflu, we will refrain from initiation of the latter.  Rather, will pursue supportive measures, as further outlined below.  Plan: Scheduled  Mucinex , as needed Tessalon  Perles.  Incentive spirometry.  Flutter valve.  Add on procalcitonin level.  As needed acetaminophen  for fever.  Repeat CMP, CBC in the morning.                 #) Cellulitis of the right foot: In setting of concern for diabetic foot wound over the right lateral malleolus, patient presents with 1 week of progressive right lateral malleolus erythema, swelling, tenderness, with some evidence of purulent drainage, and plain film showing evidence of soft tissue  swelling most pronounced over the lateral malleolus, consistent with cellulitis, without evidence of subcutaneous gas or crepitus to suggest necrotizing fasciitis.  No evidence of acute bony abnormality on plain films to suggest underlying osteomyelitis.  As described above, surgical care for sepsis are not met.  In terms of IV antibiotic selection, in the setting of concern for infected diabetic foot wound, will add IV Flagyl  for anaerobic coverage.  Additionally, given the presence of fixation hardware, will also add cefepime  for antipseudomonal coverage.  In the setting of purulent drainage, will add IV vancomycin .  EDP d/w on-call ortho from [facility] (Dr. Celena), who conveyed that the [facility] will formally consult and will see patient in the morning. Dr. Celena requests that patient be made NPO at [facility] in case it is determined that a procedure is necessary.  Charlanne Score for this patient in the context of anticipated aforementioned surgery conveys a  3.1% perioperative risk for significant cardiac event. No evidence to suggest acutely decompensated heart failure or acute MI. Consequently, no absolute contraindications to proceeding with proposed surgery at this time.  He is on daily Plavix  as well as Eliquis  as an outpatient.  Will hold the next doses of dose of these blood thinners pending orthopedic surgery evaluation in the morning.   plan: IV vancomycin , Flagyl , cefepime , as above.  Prn IV fentanyl .  Type and screen, INR, preoperative EKG.  Orthopedic surgery to formally consult, as above.  Repeat CMP, CBC in the morning.  N.p.o. after midnight per request of orthopedic surgery.                  #) Generalized weakness: 5 days of generalized weakness, in the absence of any evidence of acute focal neurologic deficits, including no evidence of acute focal weakness to suggest acute CVA.  Suspect contribution from physiologic stress stemming from presenting multiple  infectious processes, including influenza A infection as well as cellulitis of the right foot.  No e/o additional infectious process at this time, including chest x-ray which shows no evidence of acute cardiopulmonary process, including no evidence of infiltrate to suggest bacterial pneumonia..   Will further eval for any additional contributions from endocrine/metabolic sources, as detailed below.   Plan: work-up and management of presenting influenza A as well as right lower extremity cellulitis, as described above. PT/OT consults ordered for the AM. Fall precautions. CMP/CBC in the AM. Check TSH, serum Mg level.                   #) Paroxysmal atrial fibrillation: Documented history of such. In setting of CHA2DS2-VASc score of 4, there is an indication for chronic anticoagulation for thromboembolic prophylaxis. Consistent with this, patient is chronically anticoagulated on Eliquis .  However, will hold next dose of Eliquis  pending orthopedic surgery's evaluation of the patient in the morning.  Home AV nodal blocking regimen: Coreg .  Additionally, he is on oral amiodarone  at home.  Most recent echocardiogram occurred  in January 2024 was notable for LVEF 50 to 55%, indeterminate diastolic parameters, mildly dilated left atrium and trivial mitral regurgitation.  Appears to be in sinus rhythm at this time, but will check EKG .   Plan: monitor strict I's & O's and daily weights. CMP/CBC in AM. Check serum mag level. Will  plan for postoperative resumption from Coreg  and amiodarone .  Postoperative resumption of Eliquis .  Monitor on telemetry.  Check EKG.                 #) Type 2 Diabetes Mellitus: documented history of such. Home insulin  regimen: Tresiba  60 units SQ daily. Home oral hypoglycemic agents: None.  On Mounjaro  as an outpatient.  Presenting blood sugar: 241. Most recent A1c noted to be 7.0% when checked in November 2024.  in terms of initial dose of basal insulin  to be  started during this hospitalization, will resume approximately half of outpatient dose in order to reduce risk for ensuing hypoglycemia  Plan: In the setting of current n.p.o. status, will pursue Accu-Cheks every 6 hours with low-dose sliding scale insulin  for now.  Glargine 20 units SQ every morning, as above.                     #) Essential Hypertension: documented h/o such, with outpatient antihypertensive regimen including Coreg , Lasix .  SBP's in the ED today: 150s to 180s mmHg.   Plan: Close monitoring of subsequent BP via routine VS. currently n.p.o. pending orthopedic surgery evaluation.  Will plan for postoperative resumption of home beta-blocker.  Will continue to closely monitor ensuing volume status to determine appropriate timing of resumption of home Lasix .  Repeat CMP in the morning.  Monitor strict I's and O's and daily weights.  Monitor on telemetry.                   #) acquired hypothyroidism: documented h/o such, on Synthroid  as outpatient.   Plan: cont home Synthroid .  Check TSH.                   #) Obstructive sleep apnea: Documented history of such, with patient reporting good compliance on home nocturnal CPAP.   Plan: I have placed respiratory therapy consultation for provision of scheduled nocturnal CPAP.                   #) CKD Stage 4: Documented history of such, with baseline creatinine 2.1-2.6, with presenting creatinine consistent with this baseline.    Plan: Monitor strict I's and O's and daily weights.  Attempt to avoid nephrotoxic agents.  CMP/magnesium  level in the AM.       DVT prophylaxis: SCD's   Code Status: Full code Family Communication: none Disposition Plan: Per Rounding Team Consults called: EDP d/w on-call [facility] (Dr. Celena), who conveyed that their group will formally consult and see the patient in the morning, as further detailed above;   Admission status: Inpatient     I SPENT GREATER THAN 75  MINUTES IN CLINICAL CARE TIME/MEDICAL DECISION-MAKING IN COMPLETING THIS ADMISSION.      Jeremie Abdelaziz B Rosemae Mcquown DO [facility]  From 7PM - 7AM   03/27/2023, 10:49 PM

## 2023-03-27 NOTE — Progress Notes (Addendum)
 Pharmacy Antibiotic Note  Harold [name] is a 67 y.o. male for which pharmacy has been consulted for cefepime  and vancomycin  dosing for  infected diabetic foot wound with ankle hardware .  Patient with a history of HTN, HLD, CAD s/p PCI, A-fib on Eliquis , COPD, OSA, T2DM, CKD, PAD s/p stents. Patient presenting with cough x 3 days.  SCr 2.73 - near baseline WBC 7.4; T 98.6; HR 63; RR 19 COVID neg / flu pos  Plan: Cefepime  2g q12hr; Flagyl  per MD Vancomycin  2500 mg once then 2000 mg q48hr (eAUC 493.3) unless change in renal function Monitor WBC, fever, renal function, cultures De-escalate when able Levels at steady state     Temp (24hrs), Avg:98.6 F (37 C), Min:98.6 F (37 C), Max:98.6 F (37 C)  Recent Labs  Lab 03/27/23 1832  WBC 7.4  CREATININE 2.73*    CrCl cannot be calculated (Unknown ideal weight.).    Allergies  Allergen Reactions   Doxycycline  Hives and [name]   Septra [Bactrim] Itching   Penicillins [name]    Allergy to All cillin drugs  Ancef  given 9/24 with no obvious reaction   Metformin  And Related Diarrhea and Nausea Only   Other Nausea And Vomiting    General Anesthesia - sometimes causes nausea and vomiting * OK to use scopolamine  patch     Sulfamethoxazole-Trimethoprim Other (See Comments)   Wellbutrin [Bupropion]     Mood Changes   Microbiology results: Pending  Thank you for allowing pharmacy to be a part of this patient's care.  Dorn Buttner, PharmD, BCPS 03/27/2023 10:53 PM ED Clinical Pharmacist -  [phone]

## 2023-03-28 ENCOUNTER — Encounter (HOSPITAL_COMMUNITY): Payer: Self-pay | Admitting: Internal Medicine

## 2023-03-28 ENCOUNTER — Inpatient Hospital Stay (HOSPITAL_COMMUNITY): Payer: Medicare Other

## 2023-03-28 DIAGNOSIS — N184 Chronic kidney disease, stage 4 (severe): Secondary | ICD-10-CM | POA: Diagnosis present

## 2023-03-28 DIAGNOSIS — N189 Chronic kidney disease, unspecified: Secondary | ICD-10-CM

## 2023-03-28 DIAGNOSIS — L03115 Cellulitis of right lower limb: Secondary | ICD-10-CM | POA: Diagnosis present

## 2023-03-28 DIAGNOSIS — L02415 Cutaneous abscess of right lower limb: Secondary | ICD-10-CM | POA: Diagnosis not present

## 2023-03-28 DIAGNOSIS — I739 Peripheral vascular disease, unspecified: Secondary | ICD-10-CM

## 2023-03-28 DIAGNOSIS — L97314 Non-pressure chronic ulcer of right ankle with necrosis of bone: Secondary | ICD-10-CM | POA: Diagnosis not present

## 2023-03-28 DIAGNOSIS — J101 Influenza due to other identified influenza virus with other respiratory manifestations: Secondary | ICD-10-CM | POA: Diagnosis not present

## 2023-03-28 DIAGNOSIS — T847XXA Infection and inflammatory reaction due to other internal orthopedic prosthetic devices, implants and grafts, initial encounter: Secondary | ICD-10-CM | POA: Diagnosis not present

## 2023-03-28 LAB — CBC WITH DIFFERENTIAL/PLATELET
Abs Immature Granulocytes: 0.04 10*3/uL (ref 0.00–0.07)
Basophils Absolute: 0 10*3/uL (ref 0.0–0.1)
Basophils Relative: 0 %
Eosinophils Absolute: 0.1 10*3/uL (ref 0.0–0.5)
Eosinophils Relative: 1 %
HCT: 33.5 % — ABNORMAL LOW (ref 39.0–52.0)
Hemoglobin: 11.8 g/dL — ABNORMAL LOW (ref 13.0–17.0)
Immature Granulocytes: 1 %
Lymphocytes Relative: 12 %
Lymphs Abs: 1 10*3/uL (ref 0.7–4.0)
MCH: 29.5 pg (ref 26.0–34.0)
MCHC: 35.2 g/dL (ref 30.0–36.0)
MCV: 83.8 fL (ref 80.0–100.0)
Monocytes Absolute: 0.8 10*3/uL (ref 0.1–1.0)
Monocytes Relative: 10 %
Neutro Abs: 5.9 10*3/uL (ref 1.7–7.7)
Neutrophils Relative %: 76 %
Platelets: 151 10*3/uL (ref 150–400)
RBC: 4 MIL/uL — ABNORMAL LOW (ref 4.22–5.81)
RDW: 13.4 % (ref 11.5–15.5)
WBC: 7.8 10*3/uL (ref 4.0–10.5)
nRBC: 0 % (ref 0.0–0.2)

## 2023-03-28 LAB — GLUCOSE, CAPILLARY
Glucose-Capillary: 180 mg/dL — ABNORMAL HIGH (ref 70–99)
Glucose-Capillary: 186 mg/dL — ABNORMAL HIGH (ref 70–99)
Glucose-Capillary: 187 mg/dL — ABNORMAL HIGH (ref 70–99)
Glucose-Capillary: 218 mg/dL — ABNORMAL HIGH (ref 70–99)

## 2023-03-28 LAB — COMPREHENSIVE METABOLIC PANEL
ALT: 9 U/L (ref 0–44)
AST: 11 U/L — ABNORMAL LOW (ref 15–41)
Albumin: 2.1 g/dL — ABNORMAL LOW (ref 3.5–5.0)
Alkaline Phosphatase: 63 U/L (ref 38–126)
Anion gap: 15 (ref 5–15)
BUN: 23 mg/dL (ref 8–23)
CO2: 20 mmol/L — ABNORMAL LOW (ref 22–32)
Calcium: 8.1 mg/dL — ABNORMAL LOW (ref 8.9–10.3)
Chloride: 99 mmol/L (ref 98–111)
Creatinine, Ser: 2.96 mg/dL — ABNORMAL HIGH (ref 0.61–1.24)
GFR, Estimated: 22 mL/min — ABNORMAL LOW (ref >60)
Glucose, Bld: 208 mg/dL — ABNORMAL HIGH (ref 70–99)
Potassium: 2.9 mmol/L — ABNORMAL LOW (ref 3.5–5.1)
Sodium: 134 mmol/L — ABNORMAL LOW (ref 135–145)
Total Bilirubin: 0.7 mg/dL (ref 0.0–1.2)
Total Protein: 5.3 g/dL — ABNORMAL LOW (ref 6.5–8.1)

## 2023-03-28 LAB — TYPE AND SCREEN
ABO/RH(D): A POS
Antibody Screen: NEGATIVE

## 2023-03-28 LAB — RESPIRATORY PANEL BY PCR

## 2023-03-28 LAB — PROTIME-INR
INR: 1.3 — ABNORMAL HIGH (ref 0.8–1.2)
Prothrombin Time: 16.6 s — ABNORMAL HIGH (ref 11.4–15.2)

## 2023-03-28 LAB — VITAMIN B12: Vitamin B-12: 305 pg/mL (ref 180–914)

## 2023-03-28 LAB — TSH: TSH: 5.045 u[IU]/mL — ABNORMAL HIGH (ref 0.350–4.500)

## 2023-03-28 LAB — PROCALCITONIN: Procalcitonin: 0.31 ng/mL

## 2023-03-28 LAB — MAGNESIUM: Magnesium: 1.9 mg/dL (ref 1.7–2.4)

## 2023-03-28 MED ORDER — DULOXETINE HCL 60 MG PO CPEP
60.0000 mg | ORAL_CAPSULE | Freq: Every day | ORAL | Status: DC
  Administered 2023-03-28 – 2023-05-10 (×43): 60 mg via ORAL
  Filled 2023-03-28 (×44): qty 1

## 2023-03-28 MED ORDER — CARVEDILOL 3.125 MG PO TABS
3.1250 mg | ORAL_TABLET | Freq: Two times a day (BID) | ORAL | Status: DC
  Administered 2023-03-28 – 2023-04-29 (×52): 3.125 mg via ORAL
  Filled 2023-03-28 (×59): qty 1

## 2023-03-28 MED ORDER — METRONIDAZOLE 500 MG PO TABS
500.0000 mg | ORAL_TABLET | Freq: Two times a day (BID) | ORAL | Status: DC
  Administered 2023-03-28 – 2023-04-02 (×12): 500 mg via ORAL
  Filled 2023-03-28 (×12): qty 1

## 2023-03-28 MED ORDER — ALLOPURINOL 100 MG PO TABS
100.0000 mg | ORAL_TABLET | Freq: Every day | ORAL | Status: DC
  Administered 2023-03-28 – 2023-04-21 (×24): 100 mg via ORAL
  Filled 2023-03-28 (×25): qty 1

## 2023-03-28 MED ORDER — APIXABAN 5 MG PO TABS
5.0000 mg | ORAL_TABLET | Freq: Two times a day (BID) | ORAL | Status: DC
  Administered 2023-03-28 – 2023-03-29 (×2): 5 mg via ORAL
  Filled 2023-03-28 (×2): qty 1

## 2023-03-28 MED ORDER — ROSUVASTATIN CALCIUM 20 MG PO TABS
20.0000 mg | ORAL_TABLET | Freq: Every evening | ORAL | Status: DC
Start: 2023-03-28 — End: 2023-04-18
  Administered 2023-03-28 – 2023-04-17 (×21): 20 mg via ORAL
  Filled 2023-03-28 (×21): qty 1

## 2023-03-28 MED ORDER — AMIODARONE HCL 200 MG PO TABS
200.0000 mg | ORAL_TABLET | Freq: Two times a day (BID) | ORAL | Status: DC
  Administered 2023-03-28 – 2023-05-10 (×87): 200 mg via ORAL
  Filled 2023-03-28 (×85): qty 1

## 2023-03-28 MED ORDER — INSULIN GLARGINE-YFGN 100 UNIT/ML ~~LOC~~ SOLN
20.0000 [IU] | Freq: Every day | SUBCUTANEOUS | Status: DC
  Administered 2023-03-28: 20 [IU] via SUBCUTANEOUS
  Filled 2023-03-28 (×2): qty 0.2

## 2023-03-28 MED ORDER — EZETIMIBE 10 MG PO TABS
10.0000 mg | ORAL_TABLET | Freq: Every day | ORAL | Status: DC
  Administered 2023-03-28 – 2023-05-10 (×43): 10 mg via ORAL
  Filled 2023-03-28 (×44): qty 1

## 2023-03-28 MED ORDER — INSULIN ASPART 100 UNIT/ML IJ SOLN
0.0000 [IU] | Freq: Four times a day (QID) | INTRAMUSCULAR | Status: DC
  Administered 2023-03-28: 3 [IU] via SUBCUTANEOUS
  Administered 2023-03-28 (×2): 2 [IU] via SUBCUTANEOUS
  Administered 2023-03-29: 5 [IU] via SUBCUTANEOUS
  Administered 2023-03-29: 2 [IU] via SUBCUTANEOUS
  Administered 2023-03-29: 3 [IU] via SUBCUTANEOUS
  Administered 2023-03-30: 2 [IU] via SUBCUTANEOUS
  Administered 2023-03-30: 3 [IU] via SUBCUTANEOUS
  Administered 2023-03-30: 2 [IU] via SUBCUTANEOUS
  Administered 2023-03-31 – 2023-04-04 (×7): 1 [IU] via SUBCUTANEOUS
  Administered 2023-04-04: 2 [IU] via SUBCUTANEOUS
  Administered 2023-04-04: 1 [IU] via SUBCUTANEOUS
  Administered 2023-04-05: 2 [IU] via SUBCUTANEOUS
  Administered 2023-04-05 (×2): 1 [IU] via SUBCUTANEOUS
  Administered 2023-04-06 – 2023-04-07 (×5): 2 [IU] via SUBCUTANEOUS
  Administered 2023-04-07: 3 [IU] via SUBCUTANEOUS
  Administered 2023-04-07 – 2023-04-08 (×2): 2 [IU] via SUBCUTANEOUS
  Administered 2023-04-08 – 2023-04-09 (×2): 5 [IU] via SUBCUTANEOUS
  Administered 2023-04-09: 2 [IU] via SUBCUTANEOUS
  Administered 2023-04-09: 3 [IU] via SUBCUTANEOUS
  Administered 2023-04-09: 5 [IU] via SUBCUTANEOUS
  Administered 2023-04-10: 1 [IU] via SUBCUTANEOUS
  Administered 2023-04-10: 2 [IU] via SUBCUTANEOUS
  Administered 2023-04-10: 3 [IU] via SUBCUTANEOUS
  Administered 2023-04-10: 1 [IU] via SUBCUTANEOUS
  Administered 2023-04-10 – 2023-04-11 (×3): 2 [IU] via SUBCUTANEOUS
  Administered 2023-04-11: 7 [IU] via SUBCUTANEOUS
  Administered 2023-04-12 (×2): 2 [IU] via SUBCUTANEOUS
  Administered 2023-04-12: 3 [IU] via SUBCUTANEOUS
  Administered 2023-04-12 – 2023-04-14 (×4): 2 [IU] via SUBCUTANEOUS
  Administered 2023-04-14: 3 [IU] via SUBCUTANEOUS
  Administered 2023-04-14 – 2023-04-15 (×3): 2 [IU] via SUBCUTANEOUS
  Administered 2023-04-15: 5 [IU] via SUBCUTANEOUS
  Administered 2023-04-15: 2 [IU] via SUBCUTANEOUS
  Administered 2023-04-16: 3 [IU] via SUBCUTANEOUS
  Administered 2023-04-16: 1 [IU] via SUBCUTANEOUS
  Administered 2023-04-16: 2 [IU] via SUBCUTANEOUS
  Administered 2023-04-18 (×3): 1 [IU] via SUBCUTANEOUS
  Administered 2023-04-18 – 2023-04-20 (×6): 2 [IU] via SUBCUTANEOUS
  Administered 2023-04-20: 1 [IU] via SUBCUTANEOUS
  Administered 2023-04-20 – 2023-04-21 (×3): 3 [IU] via SUBCUTANEOUS
  Administered 2023-04-21 – 2023-04-22 (×4): 2 [IU] via SUBCUTANEOUS
  Administered 2023-04-23: 1 [IU] via SUBCUTANEOUS
  Administered 2023-04-24 (×3): 2 [IU] via SUBCUTANEOUS
  Administered 2023-04-25: 5 [IU] via SUBCUTANEOUS
  Administered 2023-04-25 – 2023-04-26 (×4): 2 [IU] via SUBCUTANEOUS
  Administered 2023-04-26 (×3): 1 [IU] via SUBCUTANEOUS
  Administered 2023-04-27: 3 [IU] via SUBCUTANEOUS
  Administered 2023-04-27: 1 [IU] via SUBCUTANEOUS
  Administered 2023-04-27: 3 [IU] via SUBCUTANEOUS
  Administered 2023-04-27 (×2): 2 [IU] via SUBCUTANEOUS
  Administered 2023-04-28: 3 [IU] via SUBCUTANEOUS
  Administered 2023-04-28: 1 [IU] via SUBCUTANEOUS
  Administered 2023-04-28: 2 [IU] via SUBCUTANEOUS
  Administered 2023-04-29: 1 [IU] via SUBCUTANEOUS
  Administered 2023-04-29: 2 [IU] via SUBCUTANEOUS
  Administered 2023-04-29: 3 [IU] via SUBCUTANEOUS
  Administered 2023-04-29: 1 [IU] via SUBCUTANEOUS
  Administered 2023-04-30: 2 [IU] via SUBCUTANEOUS
  Administered 2023-04-30: 1 [IU] via SUBCUTANEOUS
  Administered 2023-04-30: 3 [IU] via SUBCUTANEOUS
  Administered 2023-05-01: 2 [IU] via SUBCUTANEOUS
  Administered 2023-05-01: 1 [IU] via SUBCUTANEOUS
  Administered 2023-05-02 – 2023-05-03 (×2): 3 [IU] via SUBCUTANEOUS
  Administered 2023-05-03 – 2023-05-04 (×2): 1 [IU] via SUBCUTANEOUS
  Administered 2023-05-04: 2 [IU] via SUBCUTANEOUS
  Administered 2023-05-04 (×2): 3 [IU] via SUBCUTANEOUS
  Administered 2023-05-05: 5 [IU] via SUBCUTANEOUS
  Administered 2023-05-05: 3 [IU] via SUBCUTANEOUS
  Administered 2023-05-05 – 2023-05-06 (×2): 2 [IU] via SUBCUTANEOUS
  Administered 2023-05-06: 3 [IU] via SUBCUTANEOUS
  Administered 2023-05-06 (×2): 2 [IU] via SUBCUTANEOUS
  Administered 2023-05-07 – 2023-05-08 (×2): 3 [IU] via SUBCUTANEOUS
  Administered 2023-05-08 – 2023-05-09 (×4): 2 [IU] via SUBCUTANEOUS
  Administered 2023-05-10: 1 [IU] via SUBCUTANEOUS
  Filled 2023-03-28: qty 1

## 2023-03-28 MED ORDER — GABAPENTIN 400 MG PO CAPS
1200.0000 mg | ORAL_CAPSULE | Freq: Three times a day (TID) | ORAL | Status: DC
  Administered 2023-03-28 – 2023-03-29 (×6): 1200 mg via ORAL
  Filled 2023-03-28 (×6): qty 3

## 2023-03-28 MED ORDER — POTASSIUM CHLORIDE CRYS ER 20 MEQ PO TBCR
40.0000 meq | EXTENDED_RELEASE_TABLET | ORAL | Status: AC
  Administered 2023-03-28 (×2): 40 meq via ORAL
  Filled 2023-03-28 (×2): qty 2

## 2023-03-28 MED ORDER — COLCHICINE 0.6 MG PO TABS
0.6000 mg | ORAL_TABLET | Freq: Every day | ORAL | Status: DC
  Administered 2023-03-28 – 2023-03-29 (×2): 0.6 mg via ORAL
  Filled 2023-03-28 (×3): qty 1

## 2023-03-28 MED ORDER — LEVOTHYROXINE SODIUM 88 MCG PO TABS
88.0000 ug | ORAL_TABLET | Freq: Every day | ORAL | Status: DC
  Administered 2023-03-28 – 2023-05-10 (×41): 88 ug via ORAL
  Filled 2023-03-28 (×42): qty 1

## 2023-03-28 MED ORDER — CLOPIDOGREL BISULFATE 75 MG PO TABS
75.0000 mg | ORAL_TABLET | Freq: Every morning | ORAL | Status: DC
  Administered 2023-03-29: 75 mg via ORAL
  Filled 2023-03-28: qty 1

## 2023-03-28 NOTE — Consult Note (Signed)
 Hospital Consult    Reason for Consult: Right lower extremity PAD Requesting Physician: Jerona Sage, MD MRN #:  [id number]  History of Present Illness: This is a 68 y.o. male with history of right ankle ORIF by Dr. Celena 11/14.  This has had difficulty healing, with pus noted coming from the incision.  A chief complaint in the ED yesterday was cough, weakness, poor oral intake.  He was positive for flu.  Vascular surgery was consulted due to poor wound healing, with concern for peripheral arterial disease due to the patient's chronic renal insufficiency and poorly controlled diabetes mellitus.  On exam, Arlen was doing well.  A native of [place], he moved to [place] years ago.  He worked at replacements [employer] prior to retirement. Garrin denies symptoms of claudication, ischemic rest pain.  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   Anxiety    Atrial fibrillation (HCC) 12/2021   CAD S/P percutaneous coronary angioplasty 2003, 04/2012   status post PCI to LAD, circumflex-OM 2, RCA   Carotid artery occlusion    Chronic renal insufficiency, stage II (mild)    Chronic venous insufficiency    varicosities, no reflux; dopplers 04/14/12- valvular insufficiency in the R and L GSV   Complication of anesthesia    COPD, mild (HCC)    Depression    situaltional    Diabetes mellitus    Diabetic neuropathy (HCC) 08/24/2019   Dyslipidemia associated with type 2 diabetes mellitus (HCC)    GERD (gastroesophageal reflux disease)    History of cardioversion 12/2021   at [facility]   HTN (hypertension)    Hypothyroidism    Neuromuscular disorder (HCC)    neuropathy in feet   Neuropathy    notably improved following PCI with improved cardiac function    Obesity    Obesity, Class II, BMI 35.0-39.9, with comorbidity (see actual BMI)    BMI 39; wgt loss efforts in place; seeing Dietician   Orthostatic hypotension    PONV (postoperative nausea and vomiting)    [place]   Pulmonary hypertension (HCC) 04/2012   PA pressure   Sleep apnea    uses nightly   Vitamin D  deficiency    per facility notes    Past Surgical History:  Procedure Laterality Date   ABDOMINAL AORTAGRAM N/A 11/15/2011   Procedure: ABDOMINAL EZELLA;  Surgeon: Carlin FORBES Haddock, MD;  Location: [facility] CATH LAB;  Service: Cardiovascular;  [name];   ABDOMINAL AORTOGRAM W/LOWER EXTREMITY N/A 10/13/2019   Procedure: ABDOMINAL AORTOGRAM W/LOWER EXTREMITY;  Surgeon: Darron Deatrice LABOR, MD;  Location: [facility] INVASIVE CV LAB;  Service: CV: Non-obst Ao-Iliac. R SFA-PopA patent with significant calcific stenosis of TP trunk-prox PTA (dominant) & CTO ATA  -> R PTA-TP PTCA   ABDOMINAL AORTOGRAM W/LOWER EXTREMITY N/A 11/06/2022   Procedure: ABDOMINAL AORTOGRAM W/LOWER EXTREMITY;  Surgeon: Darron Deatrice LABOR, MD;  Location: [facility] INVASIVE CV LAB;  Service: Cardiovascular;  [name];   ABIs  04/27/2012   mild bilateral arterial insufficiency   BACK SURGERY  2005 x1   X2-2010   BUNIONECTOMY Right 11/11/2013   Procedure: RIGHT FOOT SILVER BUNIONECTOMY;  Surgeon: Norleen Armor, MD;  Location: [facility];  Service: Orthopedics;  Laterality:  Right;   CARDIAC CATHETERIZATION  12/11/2001   significant 3V CAD, normal LV function   CARDIOVERSION N/A 03/13/2022   Procedure: CARDIOVERSION;  Surgeon: Kate Lonni CROME, MD;  Location: [facility] ENDOSCOPY;  Service: Cardiovascular;  [name];   CARDIOVERSION N/A 07/25/2022   Procedure: CARDIOVERSION;  Surgeon: Jeffrie Oneil BROCKS, MD;  Location: [facility] INVASIVE CV LAB;  Service: Cardiovascular;  [name];   Carotid Doppler  04/27/2012   right internal carotid: Elevated velocities but no evidence of plaque. Left internal  carotid 40-59%   CAROTID ENDARTERECTOMY  2005   Right; recent carotid Dopplers notes elevated velocities.    colonscopy     CORONARY ANGIOPLASTY  12/21/2001   PTCA of the distal and mid AV groove circ, unsuccessful PTCA of second OM total occlusion, unsuccessful PTCA of the apical LAD total occlusion   CORONARY ANGIOPLASTY WITH STENT PLACEMENT  04/29/2012   PCI to 3 RCA lesions, Promus Premiere 2.89mmx8mm distally, mid was 2.50mx28mm and proximally 2.75x3mm, EF 55-60%   CORONARY STENT PLACEMENT  04/28/2012   PCI to LAD (3x18mm Xience DES postdilated to 3.25) and circ prox and mid (2 overlappinmg 2.54mmx12mmXience DES postdilated to 2.57mm)   DOPPLER ECHOCARDIOGRAPHY  04/28/2012   poor quality study: EF estimated 60-65%; unable to assess diastolic function (previously noted to have diastolic dysfunction); severely dilated left atrium and mild right atrium; dilated IVC consistent with increased central venous pressure.SABRA   HARDWARE REMOVAL Right 01/28/2023   Procedure: REMOVAL OF HARDWARE RIGHT ANKLE;  [name], MD;  [name];  Service: Orthopedics;  Laterality: Right;   HERNIA REPAIR     I & D EXTREMITY Right 01/02/2023   Procedure: IRRIGATION AND DEBRIDEMENT RIGHT ANKLE;  [name], MD;  [name];  Service: Orthopedics;  Laterality: Right;   LEFT AND RIGHT HEART CATHETERIZATION WITH CORONARY ANGIOGRAM N/A 04/27/2012   Procedure: LEFT AND RIGHT HEART CATHETERIZATION WITH CORONARY ANGIOGRAM;  [name], MD;  Location: [facility] CATH LAB;  Service: Cardiovascular;  [name];   LEFT AND RIGHT HEART CATHETERIZATION WITH CORONARY ANGIOGRAM N/A 05/14/2013   Procedure: LEFT AND RIGHT HEART CATHETERIZATION WITH CORONARY ANGIOGRAM;  [name], MD;  [name]; PCWP ;; [name], [name],  Patent RCA ostial mid and distal stents - 70% distal runoff Dz   LUMBAR LAMINECTOMY/DECOMPRESSION MICRODISCECTOMY  01/07/2012   Procedure: LUMBAR LAMINECTOMY/DECOMPRESSION MICRODISCECTOMY 1 LEVEL;  Surgeon: Rockey CROME Peru, MD;  [name];  Service: Neurosurgery;  Laterality: Bilateral;   Lumbar Three-Four Decompression   LUMBAR LAMINECTOMY/DECOMPRESSION MICRODISCECTOMY N/A 07/26/2020   Procedure: Lumbar two-three Laminectomy/Foraminotomy;  [name], MD;  [name];  Service: Neurosurgery;  [name];   ORIF ANKLE FRACTURE Right 01/02/2023   Procedure: OPEN REDUCTION INTERNAL FIXATION (ORIF)RIGHT ANKLE FRACTURE;  [name], MD;  [name];  Service: Orthopedics;  Laterality: Right;   PERCUTANEOUS CORONARY STENT INTERVENTION (PCI-S) N/A 04/28/2012   Procedure: PERCUTANEOUS CORONARY STENT INTERVENTION (PCI-S);  [name], MD;  Location: [facility] CATH LAB;  Service: Cardiovascular;  [name];   PERCUTANEOUS CORONARY STENT INTERVENTION (PCI-S) N/A 04/29/2012   Procedure: PERCUTANEOUS CORONARY STENT INTERVENTION (PCI-S);  [name], MD;  Location: [facility] CATH LAB;  Service: Cardiovascular;  [name];   PERIPHERAL VASCULAR ATHERECTOMY  11/06/2022   Procedure:  PERIPHERAL VASCULAR ATHERECTOMY;  Surgeon: Darron Deatrice LABOR, MD;  [facility] INVAS[facility] CV LAB;  Service: Cardiovascular;;   PERIPHERAL VASCULAR BALLOON ANGIOPLASTY Right 10/13/2019   Procedure: PERIPHERAL VASCULAR BALLOON ANGIOPLASTY;  Surgeon: Darron Deatrice LABOR, MD;  Location: [facility] INVASIVE CV LAB;  Service: Cardiovascular;  Laterality: Right;  PTCA of R TP Trunk into PTA (Drug-coated) => Follow-up ABIs 0.9 on the right and 0.99 on the left.   SPINE SURGERY     UMBILICAL HERNIA REPAIR  2009   steel mesh insert    Allergies  Allergen Reactions   Doxycycline  Hives and Rash   Septra [Bactrim] Itching   Penicillins Rash    Allergy to All cillin drugs  Ancef   given 9/24 with no obvious reaction   Metformin  And Related Diarrhea and Nausea Only   Other Nausea And Vomiting    General Anesthesia - sometimes causes nausea and vomiting * OK to use scopolamine  patch     Sulfamethoxazole-Trimethoprim Other (See Comments)   Wellbutrin [Bupropion]     Mood Changes    Prior to Admission medications   Medication Sig Start Date End Date Taking? Authorizing Provider  acetaminophen  (TYLENOL ) 500 MG tablet Take 1 tablet (500 mg total) by mouth every 6 (six) hours as needed for mild pain (pain score 1-3). 01/07/23  Yes Hongalgi, Anand D, MD  allopurinol  (ZYLOPRIM ) 100 MG tablet Take 1 tablet (100 mg total) by mouth daily. Patient taking differently: Take 100 mg by mouth 2 (two) times daily. 01/07/23  Yes Hongalgi, Anand D, MD  amiodarone  (PACERONE ) 200 MG tablet Take 1 tablet (200 mg total) by mouth daily. Patient taking differently: Take 200 mg by mouth 2 (two) times daily. 07/11/22  Yes Fenton, Clint R, PA  apixaban  (ELIQUIS ) 5 MG TABS tablet TAKE 1 TABLET BY MOUTH TWICE A DAY 02/17/23  Yes Anner Alm ORN, MD  carvedilol  (COREG ) 3.125 MG tablet TAKE 1 TABLET BY MOUTH TWICE A DAY WITH A MEAL 12/13/22  Yes Dunn, Dayna N, PA-C  clopidogrel  (PLAVIX ) 75 MG tablet Take 75 mg by mouth every morning. 02/04/23  Yes [provider]  colchicine  0.6 MG tablet Take 0.6 mg by mouth daily.   Yes [provider]  Continuous Blood Gluc Sensor (FREESTYLE LIBRE 14 DAY SENSOR) MISC APPLY 1 SENSOR EVERY 14 DAYS 08/25/17  Yes [provider]  DULoxetine  (CYMBALTA ) 60 MG capsule Take 60 mg by mouth daily.   Yes [provider]  ezetimibe  (ZETIA ) 10 MG tablet Take 10 mg by mouth daily. 05/09/22  Yes [provider]  furosemide  (LASIX ) 80 MG tablet Take 0.5 tablets (40 mg total) by mouth daily. May take additional tablet for weight gain of 3 pounds in one day or five pounds in one week Patient taking differently: Take 160 mg by mouth 2 (two)  times daily. May take additional tablet for weight gain of 3 pounds in one day or five pounds in one week 01/14/23  Yes Austria, Eric J, DO  gabapentin  (NEURONTIN ) 800 MG tablet Take 1,600 mg by mouth 3 (three) times daily. 02/13/23  Yes [provider]  HUMULIN  R U-500 KWIKPEN 500 UNIT/ML KwikPen Inject 90-180 Units into the skin in the morning and at bedtime. 02/18/23  Yes [provider]  levothyroxine  (SYNTHROID , LEVOTHROID) 88 MCG tablet Take 88 mcg by mouth daily before breakfast.   Yes [provider]  nitroGLYCERIN  (NITROSTAT ) 0.4 MG SL tablet PLACE 1 TAB UNDER THE TONGUE EVERY 5 MINUTES AS NEEDED FOR CHEST  PAIN (SEVERE PRESSURE OR TIGHTNESS) 02/22/19  Yes Anner Alm ORN, MD  pantoprazole  (PROTONIX ) 40 MG tablet TAKE 1 TABLET BY MOUTH EVERY DAY 12/13/22  Yes Dunn, Dayna N, PA-C  potassium chloride  (MICRO-K ) 10 MEQ CR capsule Take 10 mEq by mouth daily. 02/26/23  Yes [provider]  rosuvastatin  (CRESTOR ) 20 MG tablet Take 20 mg by mouth every evening.   Yes [provider]  SODIUM FLUORIDE 5000 PPM 1.1 % PSTE Take 1 Application by mouth in the morning and at bedtime. 02/17/23  Yes [provider]  tirzepatide  (MOUNJARO ) 15 MG/0.5ML Pen Inject 15 mg into the skin once a week. Patient taking differently: Inject 15 mg into the skin once a week. On Tuesdays 12/30/22  Yes   traZODone  (DESYREL ) 100 MG tablet Take 100 mg by mouth at bedtime. 03/04/23  Yes [provider]  TRESIBA  FLEXTOUCH 200 UNIT/ML FlexTouch Pen Inject 60 Units into the skin daily. 03/22/23  Yes [provider]  B-D ULTRAFINE III SHORT PEN 31G X 8 MM MISC Inject 1 each into the skin 3 (three) times daily. 11/30/19   [provider]  loratadine  (CLARITIN ) 10 MG tablet Take 1 tablet (10 mg total) by mouth daily. 01/15/23   Austria, Camellia PARAS, DO  Multiple Vitamin (MULTIVITAMIN WITH MINERALS) TABS tablet Take 1 tablet by mouth daily. 01/08/23   Hongalgi, Trenda BIRCH, MD  PRESCRIPTION MEDICATION Pt uses CPAP Machine at bedtime    [provider]    Social History   Socioeconomic History   Marital status: Married    Spouse name: Not on file   Number of children: 0   Years of education: hs   Highest education level: Not on file  Occupational History   Occupation: Surveyor, Quantity: [employer]  Tobacco Use   Smoking status: Former    Current packs/day: 0.00    Average packs/day: 1 pack/day for 42.0 years (42.0 ttl pk-yrs)    Types: Cigarettes    Start date: 03/22/1961    Quit date: 03/23/2003    Years since quitting: 20.0   Smokeless tobacco: Never   Tobacco comments:    Former smoker 02/19/22  Vaping Use   Vaping status: Never Used  Substance and Sexual Activity   Alcohol  use: No    Alcohol /week: 0.0 standard drinks of alcohol    Drug use: No   Sexual activity: Not on file  Other Topics Concern   Not on file  Social History Narrative   He and his partner Zachary have now officially gotten married on 05/26/2013.  Zachary is also a patient of our clinic.  He is now initially hyphenated his last name.   He has now  retiredfrom his long-term employment at [employer]. ->  As of September 2020   He does not smoke, he quit in 2009.  He does not drink alcohol .   He was adopted and no family history is known.    Social Drivers of Corporate Investment Banker Strain: Not on file  Food Insecurity: No Food Insecurity (03/27/2023)   Hunger Vital Sign    Worried About Running Out of Food in the Last Year: Never true    Ran Out of Food in the Last Year: Never true  Transportation Needs: No Transportation Needs (03/27/2023)   PRAPARE - Administrator, Civil Service (Medical): No    Lack of Transportation (Non-Medical): No  Physical Activity: Not on file  Stress: Not on  file  Social Connections: Unknown (03/28/2023)   Social Connection and Isolation Panel [NHANES]    Frequency of Communication with  Friends and Family: More than three times a week    Frequency of Social Gatherings with Friends and Family: More than three times a week    Attends Religious Services: Not on file    Active Member of Clubs or Organizations: Yes    Attends Banker Meetings: More than 4 times per year    Marital Status: Married  Catering Manager Violence: Not At Risk (03/27/2023)   Humiliation, Afraid, Rape, and Kick questionnaire    Fear of Current or Ex-Partner: No    Emotionally Abused: No    Physically Abused: No    Sexually Abused: No   Family History  Adopted: Yes  Family history unknown: Yes    ROS: Otherwise negative unless mentioned in HPI  Physical Examination  Vitals:   03/28/23 0953 03/28/23 1525  BP: (!) 175/65 (!) 160/70  Pulse: 63 68  Resp: 18 18  Temp: 97.6 F (36.4 C)   SpO2: 97% 95%   There is no height or weight on file to calculate BMI.  General:  WDWN in NAD Gait: Not observed HENT: WNL, normocephalic Pulmonary: normal non-labored breathing, without Rales, rhonchi,  wheezing Cardiac: regular,  Abdomen:  soft, NT/ND, no masses Skin: without rashes Vascular Exam/Pulses: Nonpalpable pulses in the right lower extremity.  Palpable femoral pulses bilaterally  Extremities: without ischemic changes, without Gangrene , without cellulitis; without open wounds;  Musculoskeletal: no muscle wasting or atrophy  Neurologic: A&O X 3;  No focal weakness or paresthesias are detected; speech is fluent/normal Psychiatric:  The pt has Normal affect. Lymph:  Unremarkable  CBC    Component Value Date/Time   WBC 7.8 03/28/2023 0447   RBC 4.00 (L) 03/28/2023 0447   HGB 11.8 (L) 03/28/2023 0447   HGB 14.6 11/11/2022 1522   HCT 33.5 (L) 03/28/2023 0447   HCT 43.8 11/11/2022 1522   PLT 151 03/28/2023 0447   PLT 169 10/29/2022 1203   MCV 83.8 03/28/2023 0447   MCV 96 10/29/2022 1203   MCH 29.5 03/28/2023 0447   MCHC 35.2 03/28/2023 0447   RDW 13.4 03/28/2023 0447   RDW  14.5 10/29/2022 1203   LYMPHSABS 1.0 03/28/2023 0447   LYMPHSABS 1.6 05/12/2018 1150   MONOABS 0.8 03/28/2023 0447   EOSABS 0.1 03/28/2023 0447   EOSABS 0.2 05/12/2018 1150   BASOSABS 0.0 03/28/2023 0447   BASOSABS 0.1 05/12/2018 1150    BMET    Component Value Date/Time   NA 134 (L) 03/28/2023 0447   NA 141 11/14/2022 1624   K 2.9 (L) 03/28/2023 0447   CL 99 03/28/2023 0447   CO2 20 (L) 03/28/2023 0447   GLUCOSE 208 (H) 03/28/2023 0447   BUN 23 03/28/2023 0447   BUN 23 11/14/2022 1624   CREATININE 2.96 (H) 03/28/2023 0447   CREATININE 1.51 (H) 10/16/2015 1053   CALCIUM  8.1 (L) 03/28/2023 0447   GFRNONAA 22 (L) 03/28/2023 0447   GFRNONAA 49 (L) 10/16/2015 1053   GFRAA 47 (L) 09/28/2019 1242   GFRAA 56 (L) 10/16/2015 1053    COAGS: Lab Results  Component Value Date   INR 1.3 (H) 03/28/2023   INR 1.3 (H) 01/02/2023   INR 1.03 05/14/2013     ASSESSMENT/PLAN: This is a 69 y.o. male with infected right ankle hardware.  Purulence noted.  Patient also has nonpalpable pulses in the right foot.  There is definitely component of peripheral arterial disease.  With his longstanding diabetes mellitus as well as CKD, I am concerned that he he has significant small vessel disease as well.  I discussed the above with both Arley and Dr. Harden, who plans to see him later today.  Arley and I discussed right lower extremity angiogram with possible intervention for right lower extremity critical limb ischemia with nonhealing wounds.  He elected to proceed.  Timing to revascularization will be pending Dr. Crist recommendations.  With the infection, angiography with possible intervention would be best after hardware management.   Fonda FORBES Rim MD MS [facility] [phone] 03/28/2023  3:33 PM

## 2023-03-28 NOTE — Progress Notes (Signed)
 PROGRESS NOTE  Harold [name]  DOB: 1955/10/31  PCP: Amin, Saad, MD FMW:[id number]  DOA: 03/27/2023  LOS: 1 day  Hospital Day: 2  Brief narrative: Harold [name] is a 68 y.o. male with PMH significant for morbid obesity, OSA, DM2, HTN, HLD, CAD/stent, A-fib on Eliquis , noted artery stenosis CKD, chronic anemia, anxiety/depression, diabetic neuropathy, GERD, hypothyroidism, COPD, pulmonary hypertension. 2/6, patient presented to the ED with complaint of cough, subjective fever, myalgia, generalized weakness for 5 days.   In the ED, patient was afebrile, blood pressure elevated to 170s to 190s Initial labs with CBC unremarkable, BMP with BUN/creatinine 22/2.73 Respiratory virus panel positive for influenza A. Chest x-ray negative for infiltrates. Right tibia-fibula x-ray showed soft tissue swelling most pronounced over the lateral malleolus. EDP discussed with orthopedics Admitted to [facility]  Subjective: Patient was seen and examined this morning.  Propped up in bed.  Not in distress Waiting for orthopedics review No family at bedside Chart reviewed No fever, blood pressure in 170s Most recent labs from this morning with potassium 2.9, BUN/creatinine 23/26  Assessment and plan: Influenza A Presented with 5 days of cough, fever, myalgia Respiratory virus panel positive for influenza A Chest x-ray without acute infiltrate No fever, no WBC count Outside the 48-hour window for Tamiflu and hence not initiated Currently on supportive care with scheduled Mucinex , as needed Tessalon  Perles, incentive spirometry Recent Labs  Lab 03/27/23 1832 03/28/23 0447  WBC 7.4 7.8  PROCALCITON  --  0.31   Right foot cellulitis In the setting of uncontrolled diabetes. In November 2024, he underwent ORIF of the right ankle, with ensuing revision in December 2024.  He has subsequently been followed by wound care at home.  However over the course the last 1 week, patient had progressive right  lateral malleolus erythema, swelling, tenderness, with some evidence of purulent drainage, X-ray showed soft tissue swelling most pronounced over the lateral malleolus, consistent with cellulitis, without evidence of subcutaneous gas or crepitus to suggest necrotizing fasciitis.  No evidence of acute bony abnormality on plain films to suggest underlying osteomyelitis.  Orthopedics consulted  remains n.p.o. Currently on broad-spectrum antibiotic coverage  Type 2 diabetes mellitus  diabetic neuropathy A1c 7 on 01/02/2023 PTA meds-Tresiba  60 units daily, Mounjaro  Lantus  20 units daily and SSI/Accu-Cheks PTA on gabapentin  1600 mg 3 times daily???.  Seems very high-dose.  Confirmed with pharmacy. Resume at 1200 mg 3 times daily. Recent Labs  Lab 03/28/23 0546 03/28/23 0701 03/28/23 1127  GLUCAP 180* 186* 187*   Paroxysmal A-fib Currently normal sinus rhythm PTA meds- Coreg , amiodarone  Continue both. Chronically anticoagulated with Eliquis .  Currently on hold  Essential hypertension PTA meds- Coreg , Lasix  Continue Coreg . keep Lasix  on hold IV hydralazine  as needed  AKI on CKD 4 Baseline creatinine 2.13 from December 2024.  Creatinine worse at 2.96 today. Recent Labs    01/07/23 0737 01/08/23 2356 01/09/23 0004 01/09/23 0622 01/10/23 0725 01/11/23 0528 01/12/23 0609 01/28/23 0906 03/27/23 1832 03/28/23 0447  BUN 41* 32* 31* 31* 35* 31* 32* 18 22 23   CREATININE 2.39* 2.18* 2.10* 2.18* 2.85* 2.48* 2.54* 2.13* 2.73* 2.96*   Hypothyroidism Continue Synthroid   Morbid Obesity  There is no height or weight on file to calculate BMI. Patient has been advised to make an attempt to improve diet and exercise patterns to aid in weight loss.  Obstructive sleep apnea Patient reported good compliance on home nocturnal CPAP  Gout Allopurinol , colchicine   Depression Cymbalta     Mobility: Needs PT  eval postprocedure  Goals of care   Code Status: Full Code     DVT  prophylaxis:  SCDs Start: 03/27/23 2243 apixaban  (ELIQUIS ) tablet 5 mg   Antimicrobials: Broad-spectrum IV antibiotics Fluid: None Consultants: Orthopedics Family Communication: None at bedside  Status: Inpatient Level of care:  Telemetry Medical   Patient is from: Home Needs to continue in-hospital care: Pending clinical course, pending orthopedic eval Anticipated d/c to: Pending clinical course    Diet:  Diet Order             Diet NPO time specified  Diet effective midnight           Diet heart healthy/carb modified Room service appropriate? Yes; Fluid consistency: Thin  Diet effective now                   Scheduled Meds:  allopurinol   100 mg Oral Daily   amiodarone   200 mg Oral BID   apixaban   5 mg Oral BID   carvedilol   3.125 mg Oral BID WC   [START ON 03/29/2023] clopidogrel   75 mg Oral q morning   colchicine   0.6 mg Oral Daily   DULoxetine   60 mg Oral Daily   ezetimibe   10 mg Oral Daily   gabapentin   1,200 mg Oral TID   guaiFENesin   600 mg Oral BID   insulin  aspart  0-9 Units Subcutaneous Q6H   insulin  glargine-yfgn  20 Units Subcutaneous Daily   levothyroxine   88 mcg Oral QAC breakfast   metroNIDAZOLE   500 mg Oral Q12H   potassium chloride   40 mEq Oral Q4H   rosuvastatin   20 mg Oral QPM    PRN meds: acetaminophen  **OR** acetaminophen , benzonatate , fentaNYL  (SUBLIMAZE ) injection, melatonin, naLOXone  (NARCAN )  injection, ondansetron  (ZOFRAN ) IV   Infusions:   ceFEPime  (MAXIPIME ) IV 2 g (03/28/23 0909)   [START ON 03/29/2023] vancomycin       Antimicrobials: Anti-infectives (From admission, onward)    Start     Dose/Rate Route Frequency Ordered Stop   03/29/23 2200  vancomycin  (VANCOREADY) IVPB 2000 mg/400 mL       Placed in Followed by Linked Group   2,000 mg 200 mL/hr over 120 Minutes Intravenous Every 48 hours 03/27/23 2257     03/28/23 1045  metroNIDAZOLE  (FLAGYL ) tablet 500 mg        500 mg Oral Every 12 hours 03/28/23 0955     03/27/23  2330  vancomycin  (VANCOCIN ) 2,500 mg in sodium chloride  0.9 % 500 mL IVPB       Placed in Followed by Linked Group   2,500 mg 262.5 mL/hr over 120 Minutes Intravenous  Once 03/27/23 2257 03/28/23 0241   03/27/23 2300  metroNIDAZOLE  (FLAGYL ) IVPB 500 mg  Status:  Discontinued        500 mg 100 mL/hr over 60 Minutes Intravenous Every 12 hours 03/27/23 2247 03/28/23 0955   03/27/23 2300  ceFEPIme  (MAXIPIME ) 2 g in sodium chloride  0.9 % 100 mL IVPB        2 g 200 mL/hr over 30 Minutes Intravenous Every 12 hours 03/27/23 2257     03/27/23 2100  ceFAZolin  (ANCEF ) IVPB 2g/100 mL premix        2 g 200 mL/hr over 30 Minutes Intravenous  Once 03/27/23 2058 03/27/23 2219       Objective: Vitals:   03/28/23 0549 03/28/23 0953  BP: (!) 173/67 (!) 175/65  Pulse: 65 63  Resp: 18 18  Temp: 98 F (36.7 C) 97.6  F (36.4 C)  SpO2: 99% 97%    Intake/Output Summary (Last 24 hours) at 03/28/2023 1256 Last data filed at 03/27/2023 2219 Gross per 24 hour  Intake 100 ml  Output --  Net 100 ml   There were no vitals filed for this visit. Weight change:  There is no height or weight on file to calculate BMI.   Physical Exam: General exam: Pleasant, elderly Caucasian male Skin: No rashes, lesions or ulcers. HEENT: Atraumatic, normocephalic, no obvious bleeding Lungs: Clear to auscultation bilaterally,  CVS: S1, S2, no murmur,   GI/Abd: Soft, nontender, nondistended, bowel sound present,   CNS: Alert, awake, oriented to place and person.  Looks tired Psychiatry: Mood appropriate,  Extremities: Trace bilateral pedal edema, no calf tenderness, right foot has bandage on  Data Review: I have personally reviewed the laboratory data and studies available.  F/u labs ordered Unresulted Labs (From admission, onward)     Start     Ordered   03/29/23 0500  CBC with Differential/Platelet  Tomorrow morning,   R        03/28/23 1256   03/29/23 0500  Basic metabolic panel  Tomorrow morning,   R         03/28/23 1256           Total time spent in review of labs and imaging, patient evaluation, formulation of plan, documentation and communication with family: 55 minutes  Signed, Chapman Rota, MD [facility] 03/28/2023

## 2023-03-28 NOTE — Consult Note (Signed)
 ORTHOPAEDIC CONSULTATION  REQUESTING PHYSICIAN: Arlice Reichert, MD  Chief Complaint: Ulceration lateral right ankle.  HPI: Harold [name] is a 68 y.o. male who presents with ischemic ulcer lateral right ankle.  Patient is status post open reduction internal fixation for trimalleolar right ankle fracture.  Patient has a history of type 2 diabetes with peripheral vascular disease.  Last ankle-brachial indices was approximately 4 months ago.  Past Medical History:  Diagnosis Date   Anemia    Anginal pain (HCC) 04/2013   Cardiac cath showed patent stents with distal LAD, circumflex-OM and RCA disease in small vessels.   Anxiety    Atrial fibrillation (HCC) 12/2021   CAD S/P percutaneous coronary angioplasty 2003, 04/2012   status post PCI to LAD, circumflex-OM 2, RCA   Carotid artery occlusion    Chronic renal insufficiency, stage II (mild)    Chronic venous insufficiency    varicosities, no reflux; dopplers 04/14/12- valvular insufficiency in the R and L GSV   Complication of anesthesia    COPD, mild (HCC)    Depression    situaltional    Diabetes mellitus    Diabetic neuropathy (HCC) 08/24/2019   Dyslipidemia associated with type 2 diabetes mellitus (HCC)    GERD (gastroesophageal reflux disease)    History of cardioversion 12/2021   at [facility]   HTN (hypertension)    Hypothyroidism    Neuromuscular disorder (HCC)    neuropathy in feet   Neuropathy    notably improved following PCI with improved cardiac function   Obesity    Obesity, Class II, BMI 35.0-39.9, with comorbidity (see actual BMI)    BMI 39; wgt loss efforts in place; seeing Dietician   Orthostatic hypotension    PONV (postoperative nausea and vomiting)    [place]   Pulmonary hypertension (HCC) 04/2012   PA pressure   Sleep apnea    uses nightly   Vitamin D  deficiency    per facility notes   Past Surgical History:  Procedure Laterality Date   ABDOMINAL AORTAGRAM N/A 11/15/2011    Procedure: ABDOMINAL EZELLA;  Surgeon: Carlin FORBES Haddock, MD;  Location: [facility] CATH LAB;  Service: Cardiovascular;  [name];   ABDOMINAL AORTOGRAM W/LOWER EXTREMITY N/A 10/13/2019   Procedure: ABDOMINAL AORTOGRAM W/LOWER EXTREMITY;  Surgeon: Darron Deatrice LABOR, MD;  Location: [facility] INVASIVE CV LAB;  Service: CV: Non-obst Ao-Iliac. R SFA-PopA patent with significant calcific stenosis of TP trunk-prox PTA (dominant) & CTO ATA  -> R PTA-TP PTCA   ABDOMINAL AORTOGRAM W/LOWER EXTREMITY N/A 11/06/2022   Procedure: ABDOMINAL AORTOGRAM W/LOWER EXTREMITY;  Surgeon: Darron Deatrice LABOR, MD;  Location: [facility] INVASIVE CV LAB;  Service: Cardiovascular;  [name];   ABIs  04/27/2012   mild bilateral arterial insufficiency   BACK SURGERY  2005 x1   X2-2010   BUNIONECTOMY Right 11/11/2013   Procedure: RIGHT FOOT SILVER BUNIONECTOMY;  Surgeon: Norleen Armor, MD;  Location: [facility];  Service: Orthopedics;  Laterality: Right;   CARDIAC CATHETERIZATION  12/11/2001   significant 3V CAD, normal LV function   CARDIOVERSION N/A 03/13/2022   Procedure: CARDIOVERSION;  Surgeon: Kate Lonni CROME, MD;  Location: [facility] ENDOSCOPY;  Service: Cardiovascular;  [name];   CARDIOVERSION N/A 07/25/2022   Procedure: CARDIOVERSION;  Surgeon: Jeffrie Oneil BROCKS, MD;  Location: [facility] INVASIVE CV LAB;  Service: Cardiovascular;  [name];   Carotid Doppler  04/27/2012   right internal carotid: Elevated velocities but no evidence of plaque. Left internal carotid  40-59%   CAROTID ENDARTERECTOMY  2005   Right; recent carotid Dopplers notes elevated velocities.    colonscopy     CORONARY ANGIOPLASTY  12/21/2001   PTCA of the distal and mid AV groove circ, unsuccessful PTCA of second OM total occlusion, unsuccessful PTCA of the apical LAD total occlusion   CORONARY ANGIOPLASTY WITH STENT PLACEMENT  04/29/2012   PCI to 3 RCA lesions, Promus Premiere 2.50mmx8mm distally, mid was 2.43mx28mm and proximally  2.75x89mm, EF 55-60%   CORONARY STENT PLACEMENT  04/28/2012   PCI to LAD (3x23mm Xience DES postdilated to 3.25) and circ prox and mid (2 overlappinmg 2.35mmx12mmXience DES postdilated to 2.9mm)   DOPPLER ECHOCARDIOGRAPHY  04/28/2012   poor quality study: EF estimated 60-65%; unable to assess diastolic function (previously noted to have diastolic dysfunction); severely dilated left atrium and mild right atrium; dilated IVC consistent with increased central venous pressure.SABRA   HARDWARE REMOVAL Right 01/28/2023   Procedure: REMOVAL OF HARDWARE RIGHT ANKLE;  [name], MD;  [name];  Service: Orthopedics;  Laterality: Right;   HERNIA REPAIR     I & D EXTREMITY Right 01/02/2023   Procedure: IRRIGATION AND DEBRIDEMENT RIGHT ANKLE;  [name], MD;  [name];  Service: Orthopedics;  Laterality: Right;   LEFT AND RIGHT HEART CATHETERIZATION WITH CORONARY ANGIOGRAM N/A 04/27/2012   Procedure: LEFT AND RIGHT HEART CATHETERIZATION WITH CORONARY ANGIOGRAM;  [name], MD;  [name];  Service: Cardiovascular;  [name];   LEFT AND RIGHT HEART CATHETERIZATION WITH CORONARY ANGIOGRAM N/A 05/14/2013   Procedure: LEFT AND RIGHT HEART CATHETERIZATION WITH CORONARY ANGIOGRAM;  [name], MD;  [name]; PCWP ;; [name], [name], Patent RCA ostial mid and distal stents - 70% distal runoff Dz   LUMBAR LAMINECTOMY/DECOMPRESSION MICRODISCECTOMY  01/07/2012   Procedure: LUMBAR LAMINECTOMY/DECOMPRESSION MICRODISCECTOMY 1 LEVEL;  [name], MD;  [name];  Service: Neurosurgery;  Laterality: Bilateral;   Lumbar Three-Four Decompression   LUMBAR LAMINECTOMY/DECOMPRESSION MICRODISCECTOMY N/A 07/26/2020   Procedure: Lumbar two-three Laminectomy/Foraminotomy;   [name], MD;  [name];  Service: Neurosurgery;  [name];   ORIF ANKLE FRACTURE Right 01/02/2023   Procedure: OPEN REDUCTION INTERNAL FIXATION (ORIF)RIGHT ANKLE FRACTURE;  [name], MD;  [name];  Service: Orthopedics;  Laterality: Right;   PERCUTANEOUS CORONARY STENT INTERVENTION (PCI-S) N/A 04/28/2012   Procedure: PERCUTANEOUS CORONARY STENT INTERVENTION (PCI-S);  [name], MD;  Location: [facility] CATH LAB;  Service: Cardiovascular;  [name];   PERCUTANEOUS CORONARY STENT INTERVENTION (PCI-S) N/A 04/29/2012   Procedure: PERCUTANEOUS CORONARY STENT INTERVENTION (PCI-S);  [name], MD;  Location: [facility] CATH LAB;  Service: Cardiovascular;  [name];   PERIPHERAL VASCULAR ATHERECTOMY  11/06/2022   Procedure: PERIPHERAL VASCULAR ATHERECTOMY;  Surgeon: Darron Deatrice DELENA, MD;  [facility] INV[facility] CV LAB;  Service: Cardiovascular;;   PERIPHERAL VASCULAR BALLOON ANGIOPLASTY Right 10/13/2019   Procedure: PERIPHERAL VASCULAR BALLOON ANGIOPLASTY;  Surgeon: Darron Deatrice DELENA, MD;  [facility]: [facility]VAS[facility] CV LAB;  Service: Cardiovascular;  Laterality: Right;  PTCA of R TP Trunk into PTA (Drug-coated) => Follow-up ABIs 0.9 on the right and 0.99 on the left.   SPINE SURGERY     UMBILICAL HERNIA REPAIR  2009   steel  mesh insert   Social History   Socioeconomic History   Marital status: Married    Spouse name: Not on file   Number of children: 0   Years of education: hs   Highest education level: Not on file  Occupational History   Occupation: Surveyor, Quantity: replacement limited  Tobacco Use   Smoking status: Former    Current packs/day: 0.00    Average packs/day: 1 pack/day for 42.0 years (42.0 ttl pk-yrs)    Types: Cigarettes    Start date: 03/22/1961    Quit date: 03/23/2003    Years since quitting: 20.0   Smokeless tobacco: Never   Tobacco comments:    Former smoker 02/19/22  Vaping Use    Vaping status: Never Used  Substance and Sexual Activity   Alcohol  use: No    Alcohol /week: 0.0 standard drinks of alcohol    Drug use: No   Sexual activity: Not on file  Other Topics Concern   Not on file  Social History Narrative   He and his partner Zachary have now officially gotten married on 05/26/2013.  Zachary is also a patient of our clinic.  He is now initially hyphenated his last name.   He has now  retiredfrom his long-term employment at [employer]. ->  As of September 2020   He does not smoke, he quit in 2009.  He does not drink alcohol .   He was adopted and no family history is known.    Social Drivers of Corporate Investment Banker Strain: Not on file  Food Insecurity: No Food Insecurity (03/27/2023)   Hunger Vital Sign    Worried About Running Out of Food in the Last Year: Never true    Ran Out of Food in the Last Year: Never true  Transportation Needs: No Transportation Needs (03/27/2023)   PRAPARE - Administrator, Civil Service (Medical): No    Lack of Transportation (Non-Medical): No  Physical Activity: Not on file  Stress: Not on file  Social Connections: Unknown (03/28/2023)   Social Connection and Isolation Panel [NHANES]    Frequency of Communication with Friends and Family: More than three times a week    Frequency of Social Gatherings with Friends and Family: More than three times a week    Attends Religious Services: Not on Marketing Executive or Organizations: Yes    Attends Engineer, Structural: More than 4 times per year    Marital Status: Married   Family History  Adopted: Yes  Family history unknown: Yes   - negative except otherwise stated in the family history section Allergies  Allergen Reactions   Doxycycline  Hives and Rash   Septra [Bactrim] Itching   Penicillins Rash    Allergy to All cillin drugs  Ancef  given 9/24 with no obvious reaction   Metformin  And Related Diarrhea and Nausea Only   Other  Nausea And Vomiting    General Anesthesia - sometimes causes nausea and vomiting * OK to use scopolamine  patch     Sulfamethoxazole-Trimethoprim Other (See Comments)   Wellbutrin [Bupropion]     Mood Changes   Prior to Admission medications   Medication Sig Start Date End Date Taking? Authorizing Provider  acetaminophen  (TYLENOL ) 500 MG tablet Take 1 tablet (500 mg total) by mouth every 6 (six) hours as needed for mild pain (pain score 1-3). 01/07/23  Yes Hongalgi, Anand D, MD  allopurinol  (ZYLOPRIM ) 100 MG tablet  Take 1 tablet (100 mg total) by mouth daily. Patient taking differently: Take 100 mg by mouth 2 (two) times daily. 01/07/23  Yes Hongalgi, Trenda BIRCH, MD  amiodarone  (PACERONE ) 200 MG tablet Take 1 tablet (200 mg total) by mouth daily. Patient taking differently: Take 200 mg by mouth 2 (two) times daily. 07/11/22  Yes Fenton, Clint R, PA  apixaban  (ELIQUIS ) 5 MG TABS tablet TAKE 1 TABLET BY MOUTH TWICE A DAY 02/17/23  Yes Anner Alm ORN, MD  carvedilol  (COREG ) 3.125 MG tablet TAKE 1 TABLET BY MOUTH TWICE A DAY WITH A MEAL 12/13/22  Yes Dunn, Dayna N, PA-C  clopidogrel  (PLAVIX ) 75 MG tablet Take 75 mg by mouth every morning. 02/04/23  Yes [provider]  colchicine  0.6 MG tablet Take 0.6 mg by mouth daily.   Yes [provider]  Continuous Blood Gluc Sensor (FREESTYLE LIBRE 14 DAY SENSOR) MISC APPLY 1 SENSOR EVERY 14 DAYS 08/25/17  Yes [provider]  DULoxetine  (CYMBALTA ) 60 MG capsule Take 60 mg by mouth daily.   Yes [provider]  ezetimibe  (ZETIA ) 10 MG tablet Take 10 mg by mouth daily. 05/09/22  Yes [provider]  furosemide  (LASIX ) 80 MG tablet Take 0.5 tablets (40 mg total) by mouth daily. May take additional tablet for weight gain of 3 pounds in one day or five pounds in one week Patient taking differently: Take 160 mg by mouth 2 (two) times daily. May take additional tablet for weight gain of 3 pounds in one day or five pounds in  one week 01/14/23  Yes Austria, Eric J, DO  gabapentin  (NEURONTIN ) 800 MG tablet Take 1,600 mg by mouth 3 (three) times daily. 02/13/23  Yes [provider]  HUMULIN  R U-500 KWIKPEN 500 UNIT/ML KwikPen Inject 90-180 Units into the skin in the morning and at bedtime. 02/18/23  Yes [provider]  levothyroxine  (SYNTHROID , LEVOTHROID) 88 MCG tablet Take 88 mcg by mouth daily before breakfast.   Yes [provider]  nitroGLYCERIN  (NITROSTAT ) 0.4 MG SL tablet PLACE 1 TAB UNDER THE TONGUE EVERY 5 MINUTES AS NEEDED FOR CHEST PAIN (SEVERE PRESSURE OR TIGHTNESS) 02/22/19  Yes Anner Alm ORN, MD  pantoprazole  (PROTONIX ) 40 MG tablet TAKE 1 TABLET BY MOUTH EVERY DAY 12/13/22  Yes Dunn, Dayna N, PA-C  potassium chloride  (MICRO-K ) 10 MEQ CR capsule Take 10 mEq by mouth daily. 02/26/23  Yes [provider]  rosuvastatin  (CRESTOR ) 20 MG tablet Take 20 mg by mouth every evening.   Yes [provider]  SODIUM FLUORIDE 5000 PPM 1.1 % PSTE Take 1 Application by mouth in the morning and at bedtime. 02/17/23  Yes [provider]  tirzepatide  (MOUNJARO ) 15 MG/0.5ML Pen Inject 15 mg into the skin once a week. Patient taking differently: Inject 15 mg into the skin once a week. On Tuesdays 12/30/22  Yes   traZODone  (DESYREL ) 100 MG tablet Take 100 mg by mouth at bedtime. 03/04/23  Yes [provider]  TRESIBA  FLEXTOUCH 200 UNIT/ML FlexTouch Pen Inject 60 Units into the skin daily. 03/22/23  Yes [provider]  B-D ULTRAFINE III SHORT PEN 31G X 8 MM MISC Inject 1 each into the skin 3 (three) times daily. 11/30/19   [provider]  loratadine  (CLARITIN ) 10 MG tablet Take 1 tablet (10 mg total) by mouth daily. 01/15/23   Austria, Camellia PARAS, DO  Multiple Vitamin (MULTIVITAMIN WITH MINERALS) TABS tablet Take 1 tablet by mouth daily. 01/08/23   Hongalgi, Trenda  D, MD  PRESCRIPTION MEDICATION Pt uses CPAP Machine at bedtime    [provider]    DG Ankle Complete Right Result Date: 03/28/2023 CLINICAL DATA:  Fracture.  Follow-up. EXAM: RIGHT ANKLE - COMPLETE 3+ VIEW COMPARISON:  Right tibia and fibula radiographs 03/27/2023, right ankle radiographs 01/28/2023 and 01/02/2023 FINDINGS: Redemonstration of distal fibular plate and screw fixation of the previously seen comminuted distal fibular diaphyseal fracture. Unchanged normal alignment. Mild interval increase in fracture line healing sclerosis compared to 01/28/2023. There are 3 associated distal fibular plate screws that fixate the distal tibiofibular syndesmosis. There are two screws fixating the medial malleolus with progressive moderate fracture line healing sclerosis with mild persistent fracture line lucency. Anterior approach distal tibial fixation screw fixates the previously seen posterior malleolar fracture in the coronal plane. This fracture line is not well visualized. Tiny plantar calcaneal heel spur. Minimal dorsal osteophytes within the midfoot. Moderate atherosclerotic calcifications. IMPRESSION: 1. Unchanged normal alignment of distal fibular plate and screw fixation of the previously seen comminuted distal fibular diaphyseal fracture. Mild interval increase in fracture line healing sclerosis compared to 01/28/2023. 2. Unchanged normal alignment of medial malleolar screw fixation with progressive moderate fracture line healing sclerosis. 3. Unchanged normal alignment of posterior malleolar screw fixation. Electronically Signed   By: Tanda Lyons M.D.   On: 03/28/2023 13:31   DG Tibia/Fibula Right Result Date: 03/27/2023 CLINICAL DATA:  Evaluate for gas, deep wound noted. EXAM: RIGHT TIBIA AND FIBULA - 2 VIEW COMPARISON:  01/28/2023. FINDINGS: There is no evidence of acute fracture or other focal bone lesions. Fixation hardware is noted in the distal tibia and fibula without evidence of hardware loosening. Vascular calcifications are present in the soft tissues. Soft tissue swelling  is noted most pronounced of the medial malleolus. No subcutaneous emphysema is seen. IMPRESSION: 1. Soft tissue swelling most pronounced over the lateral malleolus. No subcutaneous emphysema is seen. 2. No acute osseous abnormality. Electronically Signed   By: Leita Birmingham M.D.   On: 03/27/2023 21:17   DG Chest 2 View Result Date: 03/27/2023 CLINICAL DATA:  Cough and fever for 3 days. EXAM: CHEST - 2 VIEW COMPARISON:  01/02/2023 FINDINGS: The heart size and mediastinal contours are within normal limits. Both lungs are clear. The visualized skeletal structures are unremarkable. IMPRESSION: No active cardiopulmonary disease. Electronically Signed   By: Norleen DELENA Kil M.D.   On: 03/27/2023 18:57   - pertinent xrays, CT, MRI studies were reviewed and independently interpreted  Positive ROS: All other systems have been reviewed and were otherwise negative with the exception of those mentioned in the HPI and as above.  Physical Exam: General: Alert, no acute distress Psychiatric: Patient is competent for consent with normal mood and affect Lymphatic: No axillary or cervical lymphadenopathy Cardiovascular: No pedal edema Respiratory: No cyanosis, no use of accessory musculature GI: No organomegaly, abdomen is soft and non-tender    Images:  @ENCIMAGES @  Labs:  Lab Results  Component Value Date   HGBA1C 7.0 (H) 01/02/2023   HGBA1C 9.4 (H) 07/24/2020   HGBA1C 7.1 (H) 02/20/2018   LABURIC 6.7 02/20/2018   LABURIC 6.2 09/05/2016   LABURIC 5.6 08/31/2015   REPTSTATUS 07/03/2014 FINAL 06/29/2014   GRAMSTAIN No WBC Seen 10/18/2011   GRAMSTAIN No Squamous Epithelial Cells Seen 10/18/2011   GRAMSTAIN Rare Gram Positive Cocci In Pairs 10/18/2011   CULT  06/29/2014    NO SALMONELLA, SHIGELLA, CAMPYLOBACTER, YERSINIA, OR E.COLI [id number]:[id number] ISOLATED Performed at [facility]  LABORGA  10/18/2011    Moderate STAPHYLOCOCCUS SPECIES (COAGULASE NEGATIVE)    Lab Results  Component Value Date    ALBUMIN  2.1 (L) 03/28/2023   ALBUMIN  2.3 (L) 03/27/2023   ALBUMIN  3.0 (L) 01/08/2023   LABURIC 6.7 02/20/2018   LABURIC 6.2 09/05/2016   LABURIC 5.6 08/31/2015        Latest Ref Rng & Units 03/28/2023    4:47 AM 03/27/2023    6:32 PM 01/28/2023    9:06 AM  CBC EXTENDED  WBC 4.0 - 10.5 K/uL 7.8  7.4  5.4   RBC 4.22 - 5.81 MIL/uL 4.00  4.72  3.63   Hemoglobin 13.0 - 17.0 g/dL 88.1  85.8  88.7   HCT 39.0 - 52.0 % 33.5  40.1  32.8   Platelets 150 - 400 K/uL 151  189  157   NEUT# 1.7 - 7.7 K/uL 5.9  5.6    Lymph# 0.7 - 4.0 K/uL 1.0  1.0      Neurologic: Patient does not have protective sensation bilateral lower extremities.   MUSCULOSKELETAL:   Skin: Examination patient has a small ischemic ulcer dorsally over the ankle. He has a large necrotic ulcer 2 cm in diameter over the distal fibula with purulent drainage and necrotic tissue in the wound bed.  There is also ischemic changes proximally on the fibula at the location of the proximal aspect of the fibular plate.  Patient does not have a palpable pulse.  Most recent white cell count is 7.8 with a hemoglobin of 11.8.  Albumin  is 2.1 with a hemoglobin A1c of 7.0.  Review of the radiographs shows a ulcer over the distal fibula that is distal to the plate.  Assessment: Assessment: Type 2 diabetes with peripheral vascular disease with ischemic ulceration lateral right fibula status post open reduction internal fixation with purulent drainage.  Plan: Plan: Patient will need staged intervention.  Ankle-brachial indices are pending and anticipate endovascular evaluation.  Will need to remove the hardware.  Plan for wound care intervention if vascular intervention is available to improve circulation.  Discussed without improved circulation patient may require a below-knee amputation.  Thank you for the consult and the opportunity to see Mr. Harold Jerona Sage, MD [facility] [phone] 4:26 PM

## 2023-03-28 NOTE — Evaluation (Addendum)
 Physical Therapy Evaluation Patient Details Name: Harold [name] MRN: [id number] DOB: 05-05-1955 Today's Date: 03/28/2023  History of Present Illness  68 y.o. male presents to [facility] 03/27/23 with productive cough and generalized weakness. Admitted with influenza A and cellulitis of R foot. Prior admit 11/24 for R ankle ORIF and in 12/24 for revision. Significant PMH: HTN, HLD, CAD s/p PCI, COPD, DM2, CKD stage IIIb, PVD, gout, OSA, neuropathy, COPD, a-fib, CAD s/p percutaneous coronary angioplasty, angina, obesity   Clinical Impression  Pt in bed upon arrival and agreeable to PT eval. Prior to admit, pt reports walking a short distance with a RW with limited WB on R LE. Pt reports 4 prior falls due to either moving too quickly or sliding out of chair/bed. In today's session, pt was limited by fatigue. Pt was able to move from sup/sit with CGA and increased time/effort. Once seated EOB, pt preferred to not stand due to fatigue and due to the fact that he has not eaten much in the past 4 days. Pt required MinA to return to supine for LE management. Anticipate pt will progress well with continued mobility. Pt presents to therapy session with decreased R>L LE strength, balance, and mobility. Pt would benefit from acute skilled PT to address functional impairments. Recommending post-acute HHPT pending progress with mobility. Acute PT to follow.          If plan is discharge home, recommend the following: A lot of help with walking and/or transfers;A lot of help with bathing/dressing/bathroom;Assist for transportation;Help with stairs or ramp for entrance     Equipment Recommendations BSC/3in1     Functional Status Assessment Patient has had a recent decline in their functional status and demonstrates the ability to make significant improvements in function in a reasonable and predictable amount of time.     Precautions / Restrictions Precautions Precautions: Fall Precaution Comments: flu  + Restrictions Weight Bearing Restrictions Per Provider Order: Yes RLE Weight Bearing Per Provider Order: Weight bearing as tolerated Other Position/Activity Restrictions: per [facility] 03/28/23      Mobility  Bed Mobility Overal bed mobility: Needs Assistance Bed Mobility: Supine to Sit, Sit to Supine     Supine to sit: Contact guard Sit to supine: Min assist   General bed mobility comments: CGA for safety to EOB, but MinA to fully bring LEs back to supine. Increased time and effort. +2 to scoot towards [facility]    Transfers  General transfer comment: pt declined due to fatigue      Balance Overall balance assessment: Needs assistance Sitting-balance support: No upper extremity supported, Feet supported Sitting balance-Leahy Scale: Fair Sitting balance - Comments: statically at EOB       Pertinent Vitals/Pain Pain Assessment Pain Assessment: No/denies pain    Home Living Family/patient expects to be discharged to:: Private residence Living Arrangements: Spouse/significant other Available Help at Discharge: Family Type of Home: House Home Access: Stairs to enter Entrance Stairs-Rails: None Entrance Stairs-Number of Steps: 3   Home Layout: One level Home Equipment: Pharmacist, Hospital (2 wheels)      Prior Function Prior Level of Function : History of Falls (last six months);Needs assist      Mobility Comments: in dec. returned home from rehab, 4 falls since returning home; reports using RW for mobility ADLs Comments: needing assist for ADLs from significant other, was doing basin baths     Extremity/Trunk Assessment   Upper Extremity Assessment Upper Extremity Assessment: Defer to OT evaluation  Lower Extremity Assessment Lower Extremity Assessment: RLE deficits/detail;LLE deficits/detail RLE Deficits / Details: hip flexion 4/5, knee ext 4+/5, deferred ankle MMT RLE Sensation: WNL LLE Deficits / Details: grossly 4+/5 LLE Sensation: WNL     Cervical / Trunk Assessment Cervical / Trunk Assessment: Normal  Communication   Communication Communication: No apparent difficulties  Cognition Arousal: Alert Behavior During Therapy: WFL for tasks assessed/performed Overall Cognitive Status: Within Functional Limits for tasks assessed     General Comments General comments (skin integrity, edema, etc.): VSS on RA, bandages over R ankle     PT Assessment Patient needs continued PT services  PT Problem List Decreased strength;Decreased range of motion;Decreased activity tolerance;Decreased balance;Decreased mobility;Obesity       PT Treatment Interventions DME instruction;Gait training;Stair training;Functional mobility training;Therapeutic exercise;Therapeutic activities;Balance training;Neuromuscular re-education;Patient/family education    PT Goals (Current goals can be found in the Care Plan section)  Acute Rehab PT Goals Patient Stated Goal: to get better PT Goal Formulation: With patient Time For Goal Achievement: 04/11/23 Potential to Achieve Goals: Good    Frequency Min 1X/week     Co-evaluation PT/OT/SLP Co-Evaluation/Treatment: Yes Reason for Co-Treatment: To address functional/ADL transfers;For patient/therapist safety;Other (comment) (act tolerance) PT goals addressed during session: Mobility/safety with mobility;Balance;Proper use of DME OT goals addressed during session: ADL's and self-care       AM-PAC PT 6 Clicks Mobility  Outcome Measure Help needed turning from your back to your side while in a flat bed without using bedrails?: A Little Help needed moving from lying on your back to sitting on the side of a flat bed without using bedrails?: A Little Help needed moving to and from a bed to a chair (including a wheelchair)?: A Lot Help needed standing up from a chair using your arms (e.g., wheelchair or bedside chair)?: A Lot Help needed to walk in hospital room?: A Lot Help needed climbing 3-5  steps with a railing? : A Lot 6 Click Score: 14    End of Session   Activity Tolerance: Patient limited by fatigue Patient left: in bed;with call bell/phone within reach;with bed alarm set Nurse Communication: Mobility status PT Visit Diagnosis: History of falling (Z91.81);Muscle weakness (generalized) (M62.81);Other abnormalities of gait and mobility (R26.89)    Time: 8744-8681 PT Time Calculation (min) (ACUTE ONLY): 23 min   Charges:   PT Evaluation $PT Eval Low Complexity: 1 Low   PT General Charges $$ ACUTE PT VISIT: 1 Visit        Kate ORN, PT, DPT Secure Chat Preferred  Rehab Office [phone]  Kate BRAVO Wendolyn 03/28/2023, 2:09 PM

## 2023-03-28 NOTE — Evaluation (Signed)
 Occupational Therapy Evaluation Patient Details Name: Harold [name] MRN: [id number] DOB: [birth year] Today's Date: 03/28/2023   History of Present Illness 68 y.o. male presents to [facility] 03/27/23 with productive cough and generalized weakness. Admitted with influenza A and cellulitis of R foot. Prior admit 11/24 for R ankle ORIF and in 12/24 for revision. Significant PMH: HTN, HLD, CAD s/p PCI, COPD, DM2, CKD stage IIIb, PVD, gout, OSA, neuropathy, COPD, a-fib, CAD s/p percutaneous coronary angioplasty, angina, obesity   Clinical Impression   PTA patient reports living at home with his significant other, needing assist for some ADLs but able to walk short distances using RW.  Admitted for above and presents with problem list below.  Pt fatigued and has poor activity tolerance but able to complete bed mobility with min to min guard assist.  He requires setup to total assist for ADLs, declines attempts to stand. Anticipate he will progress well, and at this time recommend HHOT service at dc (pending progress).  Will follow acutely.       If plan is discharge home, recommend the following: A lot of help with bathing/dressing/bathroom;Two people to help with walking and/or transfers;Assistance with cooking/housework;Assist for transportation;Help with stairs or ramp for entrance    Functional Status Assessment  Patient has had a recent decline in their functional status and demonstrates the ability to make significant improvements in function in a reasonable and predictable amount of time.  Equipment Recommendations  BSC/3in1    Recommendations for Other Services       Precautions / Restrictions Precautions Precautions: Fall Precaution Comments: flu + Restrictions Weight Bearing Restrictions Per Provider Order: Yes RLE Weight Bearing Per Provider Order: Weight bearing as tolerated Other Position/Activity Restrictions: /per [facility] 03/28/23      Mobility Bed Mobility Overal bed  mobility: Needs Assistance Bed Mobility: Supine to Sit, Sit to Supine     Supine to sit: Contact guard Sit to supine: Min assist   General bed mobility comments: min guard to EOB, but min assist to fully bring LEs back to supine.  +2 to scoot towards [facility]    Transfers                   General transfer comment: pt declined due to weakness      Balance Overall balance assessment: Needs assistance Sitting-balance support: No upper extremity supported, Feet supported Sitting balance-Leahy Scale: Fair Sitting balance - Comments: statically at EOB                                   ADL either performed or assessed with clinical judgement   ADL Overall ADL's : Needs assistance/impaired     Grooming: Set up;Sitting           Upper Body Dressing : Set up;Sitting   Lower Body Dressing: Total assistance;Sitting/lateral leans;Bed level     Toilet Transfer Details (indicate cue type and reason): deferred         Functional mobility during ADLs: Minimal assistance General ADL Comments: limited to EOB     Vision   Vision Assessment?: No apparent visual deficits     Perception         Praxis         Pertinent Vitals/Pain Pain Assessment Pain Assessment: No/denies pain     Extremity/Trunk Assessment Upper Extremity Assessment Upper Extremity Assessment: Generalized weakness   Lower Extremity Assessment Lower Extremity Assessment:  Defer to PT evaluation       Communication Communication Communication: No apparent difficulties   Cognition Arousal: Alert Behavior During Therapy: WFL for tasks assessed/performed Overall Cognitive Status: Within Functional Limits for tasks assessed                                       General Comments       Exercises     Shoulder Instructions      Home Living Family/patient expects to be discharged to:: Private residence Living Arrangements: Spouse/significant other Available  Help at Discharge: Family Type of Home: House Home Access: Stairs to enter Secretary/administrator of Steps: 3 Entrance Stairs-Rails: None Home Layout: One level     Bathroom Shower/Tub: Tub/shower unit (has been sponge bathing)   Bathroom Toilet: Handicapped height     Home Equipment: Pharmacist, Hospital (2 wheels)          Prior Functioning/Environment Prior Level of Function : History of Falls (last six months);Needs assist             Mobility Comments: in dec returned home from rehab, 4 falls since returning home; reports using RW for mobility ADLs Comments: needing assist for ADLs from significant other, was doing basin baths        OT Problem List: Decreased strength;Decreased activity tolerance;Impaired balance (sitting and/or standing);Obesity;Decreased knowledge of precautions;Decreased knowledge of use of DME or AE;Decreased safety awareness      OT Treatment/Interventions: Self-care/ADL training;Therapeutic exercise;DME and/or AE instruction;Therapeutic activities;Patient/family education;Balance training;Energy conservation    OT Goals(Current goals can be found in the care plan section) Acute Rehab OT Goals Patient Stated Goal: home, feel better OT Goal Formulation: With patient Time For Goal Achievement: 04/11/23 Potential to Achieve Goals: Good  OT Frequency: Min 1X/week    Co-evaluation PT/OT/SLP Co-Evaluation/Treatment: Yes Reason for Co-Treatment: To address functional/ADL transfers;For patient/therapist safety;Other (comment) (act tolerance)   OT goals addressed during session: ADL's and self-care      AM-PAC OT 6 Clicks Daily Activity     Outcome Measure Help from another person eating meals?: None Help from another person taking care of personal grooming?: A Little Help from another person toileting, which includes using toliet, bedpan, or urinal?: A Lot Help from another person bathing (including washing, rinsing, drying)?: A  Lot Help from another person to put on and taking off regular upper body clothing?: A Little Help from another person to put on and taking off regular lower body clothing?: Total 6 Click Score: 15   End of Session Nurse Communication: Mobility status;Precautions  Activity Tolerance: Patient limited by fatigue Patient left: in bed;with call bell/phone within reach;with bed alarm set  OT Visit Diagnosis: Other abnormalities of gait and mobility (R26.89);Muscle weakness (generalized) (M62.81);History of falling (Z91.81)                Time: 1254-1316 OT Time Calculation (min): 22 min Charges:  OT General Charges $OT Visit: 1 Visit OT Evaluation $OT Eval Moderate Complexity: 1 Mod  Etta NOVAK, OT Acute Rehabilitation Services Office [phone]   Etta GORMAN Hope 03/28/2023, 1:48 PM

## 2023-03-28 NOTE — Progress Notes (Signed)
   03/28/23 2223  BiPAP/CPAP/SIPAP  $ Non-Invasive Ventilator  Non-Invasive Vent Set Up  $ Face Mask Large  Yes  BiPAP/CPAP/SIPAP Pt Type Adult  BiPAP/CPAP/SIPAP Resmed  Mask Type Full face mask  Mask Size Large  PEEP 5 cmH20  FiO2 (%) 21 %  Patient Home Equipment No  Auto Titrate No

## 2023-03-29 DIAGNOSIS — J101 Influenza due to other identified influenza virus with other respiratory manifestations: Secondary | ICD-10-CM | POA: Diagnosis not present

## 2023-03-29 LAB — CBC WITH DIFFERENTIAL/PLATELET
Abs Immature Granulocytes: 0.03 10*3/uL (ref 0.00–0.07)
Basophils Absolute: 0.1 10*3/uL (ref 0.0–0.1)
Basophils Relative: 1 %
Eosinophils Absolute: 0.4 10*3/uL (ref 0.0–0.5)
Eosinophils Relative: 6 %
HCT: 33.7 % — ABNORMAL LOW (ref 39.0–52.0)
Hemoglobin: 11.7 g/dL — ABNORMAL LOW (ref 13.0–17.0)
Immature Granulocytes: 1 %
Lymphocytes Relative: 17 %
Lymphs Abs: 1.1 10*3/uL (ref 0.7–4.0)
MCH: 29.5 pg (ref 26.0–34.0)
MCHC: 34.7 g/dL (ref 30.0–36.0)
MCV: 84.9 fL (ref 80.0–100.0)
Monocytes Absolute: 0.9 10*3/uL (ref 0.1–1.0)
Monocytes Relative: 14 %
Neutro Abs: 4 10*3/uL (ref 1.7–7.7)
Neutrophils Relative %: 61 %
Platelets: 161 10*3/uL (ref 150–400)
RBC: 3.97 MIL/uL — ABNORMAL LOW (ref 4.22–5.81)
RDW: 13.6 % (ref 11.5–15.5)
WBC: 6.4 10*3/uL (ref 4.0–10.5)
nRBC: 0 % (ref 0.0–0.2)

## 2023-03-29 LAB — GLUCOSE, CAPILLARY
Glucose-Capillary: 206 mg/dL — ABNORMAL HIGH (ref 70–99)
Glucose-Capillary: 191 mg/dL — ABNORMAL HIGH (ref 70–99)
Glucose-Capillary: 217 mg/dL — ABNORMAL HIGH (ref 70–99)
Glucose-Capillary: 264 mg/dL — ABNORMAL HIGH (ref 70–99)

## 2023-03-29 LAB — BASIC METABOLIC PANEL
Anion gap: 14 (ref 5–15)
BUN: 25 mg/dL — ABNORMAL HIGH (ref 8–23)
CO2: 20 mmol/L — ABNORMAL LOW (ref 22–32)
Calcium: 8.1 mg/dL — ABNORMAL LOW (ref 8.9–10.3)
Chloride: 104 mmol/L (ref 98–111)
Creatinine, Ser: 2.61 mg/dL — ABNORMAL HIGH (ref 0.61–1.24)
GFR, Estimated: 26 mL/min — ABNORMAL LOW (ref >60)
Glucose, Bld: 205 mg/dL — ABNORMAL HIGH (ref 70–99)
Potassium: 2.9 mmol/L — ABNORMAL LOW (ref 3.5–5.1)
Sodium: 138 mmol/L (ref 135–145)

## 2023-03-29 MED ORDER — POTASSIUM CHLORIDE CRYS ER 20 MEQ PO TBCR
40.0000 meq | EXTENDED_RELEASE_TABLET | ORAL | Status: AC
Start: 2023-03-29 — End: 2023-03-29
  Administered 2023-03-29 (×2): 40 meq via ORAL
  Filled 2023-03-29 (×2): qty 2

## 2023-03-29 MED ORDER — INSULIN GLARGINE-YFGN 100 UNIT/ML ~~LOC~~ SOLN: 30.0000 [IU] | Freq: Every day | SUBCUTANEOUS | Status: DC

## 2023-03-29 MED ORDER — INSULIN GLARGINE-YFGN 100 UNIT/ML ~~LOC~~ SOLN
30.0000 [IU] | Freq: Every day | SUBCUTANEOUS | Status: DC
  Administered 2023-03-29 – 2023-04-06 (×8): 30 [IU] via SUBCUTANEOUS
  Filled 2023-03-29 (×11): qty 0.3

## 2023-03-29 NOTE — Progress Notes (Signed)
 PROGRESS NOTE  Harold [name]  DOB: 14-May-1955  PCP: Amin, Saad, MD FMW:[id number]  DOA: 03/27/2023  LOS: 2 days  Hospital Day: 3  Brief narrative: Harold [name] is a 68 y.o. male with PMH significant for morbid obesity, OSA, DM2, HTN, HLD, CAD/stent, A-fib on Eliquis , carotid artery stenosis, CKD, chronic anemia, anxiety/depression, diabetic neuropathy, GERD, hypothyroidism, COPD, pulmonary hypertension. 2/6, patient presented to the ED with complaint of cough, subjective fever, myalgia, generalized weakness for 5 days.   In the ED, patient was afebrile, blood pressure elevated to 170s to 190s Initial labs with CBC unremarkable, BMP with BUN/creatinine 22/2.73 Respiratory virus panel positive for influenza A. Chest x-ray negative for infiltrates. Right tibia-fibula x-ray showed soft tissue swelling most pronounced over the lateral malleolus. EDP discussed with orthopedics Admitted to [facility]  Subjective: Patient was seen and examined this morning.  Propped up in bed.  On CPAP.  Alert, awake, oriented x 3.  Able to meaningful conversation.  No family at bedside.   Orthopedics and vascular follow-up noted.   Noted a plan of hardware removal on Monday and angiogram on Tuesday Labs from this morning with potassium low at 2.9  Assessment and plan: Influenza A Presented with 5 days of cough, fever, myalgia Respiratory virus panel positive for influenza A Chest x-ray without acute infiltrate No fever, no WBC count Was outside the 48-hour window for Tamiflu and hence was not initiated Currently on supportive care with scheduled Mucinex , as needed Tessalon  Perles, incentive spirometry Respiratory status stable. Recent Labs  Lab 03/27/23 1832 03/28/23 0447 03/29/23 0407  WBC 7.4 7.8 6.4  PROCALCITON  --  0.31  --    Right foot cellulitis In the setting of uncontrolled diabetes. In November 2024, he underwent ORIF of the right ankle, with ensuing revision in December  2024.  He has subsequently been followed by wound care at home.  However over the course the last 1 week, patient had progressive right lateral malleolus erythema, swelling, tenderness, with some evidence of purulent drainage, X-ray showed soft tissue swelling most pronounced over the lateral malleolus, consistent with cellulitis, without evidence of subcutaneous gas or crepitus to suggest necrotizing fasciitis.  No evidence of acute bony abnormality on plain films to suggest underlying osteomyelitis.  Orthopedics and vascular surgery consult appreciated. Noted a plan of hardware removal on Monday and angiogram on Tuesday Currently on broad-spectrum antibiotic coverage  Type 2 diabetes mellitus  diabetic neuropathy A1c 7 on 01/02/2023 PTA meds-Tresiba  60 units daily, Mounjaro  Currently on Lantus  20 units daily and SSI/Accu-Cheks.  Blood sugar level running mostly elevated.  Increase Lantus  to 30 units from tomorrow PTA on gabapentin  1600 mg 3 times daily???.  Seems very high-dose.  Confirmed with pharmacy. Currently resumed at 1200 mg 3 times daily. Recent Labs  Lab 03/28/23 0701 03/28/23 1127 03/28/23 1727 03/29/23 0139 03/29/23 0550  GLUCAP 186* 187* 218* 206* 191*   Paroxysmal A-fib Currently normal sinus rhythm PTA meds- Coreg , amiodarone  Continue both. Chronically anticoagulated with Eliquis .  Currently on hold Patient is on normal sinus rhythm and hence not started on any bridging while off Eliquis   Essential hypertension PTA meds- Coreg , Lasix  Continue Coreg . keep Lasix  on hold IV hydralazine  as needed  CAD/stent Carotid artery stenosis HLD PTA meds -Eliquis , Plavix , Crestor  Continue Crestor .  Eliquis  and Plavix  on hold for procedures  AKI on CKD 4 Baseline creatinine 2.13 from December 2024.  Creatinine worse at 2.61 today. Recent Labs    01/08/23 2356 01/09/23 0004 01/09/23  9377 01/10/23 0725 01/11/23 0528 01/12/23 0609 01/28/23 0906 03/27/23 1832  03/28/23 0447 03/29/23 0407  BUN 32* 31* 31* 35* 31* 32* 18 22 23  25*  CREATININE 2.18* 2.10* 2.18* 2.85* 2.48* 2.54* 2.13* 2.73* 2.96* 2.61*   Hypothyroidism Continue Synthroid   Morbid Obesity  There is no height or weight on file to calculate BMI. Patient has been advised to make an attempt to improve diet and exercise patterns to aid in weight loss.  Obstructive sleep apnea Patient reported good compliance on home nocturnal CPAP  Gout Allopurinol , colchicine   Depression Cymbalta    Mobility: Needs PT eval postprocedure  Goals of care   Code Status: Full Code     DVT prophylaxis:  SCDs Start: 03/27/23 2243   Antimicrobials: Broad-spectrum IV antibiotics Fluid: None Consultants: Orthopedics, vascular surgery Family Communication: None at bedside  Status: Inpatient Level of care:  Telemetry Medical   Patient is from: Home Needs to continue in-hospital care: Pending clinical course, pending procedure on Monday and Tuesday Anticipated d/c to: Pending clinical course    Diet:  Diet Order             Diet heart healthy/carb modified Room service appropriate? Yes; Fluid consistency: Thin  Diet effective now                   Scheduled Meds:  allopurinol   100 mg Oral Daily   amiodarone   200 mg Oral BID   carvedilol   3.125 mg Oral BID WC   colchicine   0.6 mg Oral Daily   DULoxetine   60 mg Oral Daily   ezetimibe   10 mg Oral Daily   gabapentin   1,200 mg Oral TID   guaiFENesin   600 mg Oral BID   insulin  aspart  0-9 Units Subcutaneous Q6H   insulin  glargine-yfgn  20 Units Subcutaneous Daily   levothyroxine   88 mcg Oral QAC breakfast   metroNIDAZOLE   500 mg Oral Q12H   potassium chloride   40 mEq Oral Q4H   rosuvastatin   20 mg Oral QPM    PRN meds: acetaminophen  **OR** acetaminophen , benzonatate , fentaNYL  (SUBLIMAZE ) injection, melatonin, naLOXone  (NARCAN )  injection, ondansetron  (ZOFRAN ) IV   Infusions:   ceFEPime  (MAXIPIME ) IV 2 g (03/29/23 1038)    vancomycin       Antimicrobials: Anti-infectives (From admission, onward)    Start     Dose/Rate Route Frequency Ordered Stop   03/29/23 2200  vancomycin  (VANCOREADY) IVPB 2000 mg/400 mL       Placed in Followed by Linked Group   2,000 mg 200 mL/hr over 120 Minutes Intravenous Every 48 hours 03/27/23 2257     03/28/23 1045  metroNIDAZOLE  (FLAGYL ) tablet 500 mg        500 mg Oral Every 12 hours 03/28/23 0955     03/27/23 2330  vancomycin  (VANCOCIN ) 2,500 mg in sodium chloride  0.9 % 500 mL IVPB       Placed in Followed by Linked Group   2,500 mg 262.5 mL/hr over 120 Minutes Intravenous  Once 03/27/23 2257 03/28/23 0241   03/27/23 2300  metroNIDAZOLE  (FLAGYL ) IVPB 500 mg  Status:  Discontinued        500 mg 100 mL/hr over 60 Minutes Intravenous Every 12 hours 03/27/23 2247 03/28/23 0955   03/27/23 2300  ceFEPIme  (MAXIPIME ) 2 g in sodium chloride  0.9 % 100 mL IVPB        2 g 200 mL/hr over 30 Minutes Intravenous Every 12 hours 03/27/23 2257     03/27/23 2100  ceFAZolin  (  ANCEF ) IVPB 2g/100 mL premix        2 g 200 mL/hr over 30 Minutes Intravenous  Once 03/27/23 2058 03/27/23 2219       Objective: Vitals:   03/29/23 0440 03/29/23 0927  BP: (!) 166/74 (!) 162/70  Pulse: (!) 56 (!) 58  Resp:  17  Temp: 98.2 F (36.8 C) 98.4 F (36.9 C)  SpO2: 100% 94%    Intake/Output Summary (Last 24 hours) at 03/29/2023 1124 Last data filed at 03/29/2023 1044 Gross per 24 hour  Intake 340 ml  Output 800 ml  Net -460 ml   There were no vitals filed for this visit. Weight change:  There is no height or weight on file to calculate BMI.   Physical Exam: General exam: Pleasant, elderly Caucasian male Skin: No rashes, lesions or ulcers. HEENT: Atraumatic, normocephalic, no obvious bleeding Lungs: Clear to auscultation bilaterally, on CPAP CVS: S1, S2, no murmur,   GI/Abd: Soft, nontender, nondistended, bowel sound present,   CNS: Alert, awake, oriented to place and person. Psychiatry:  Mood appropriate,  Extremities: Trace bilateral pedal edema, no calf tenderness, right foot has bandage on  Data Review: I have personally reviewed the laboratory data and studies available.  F/u labs ordered Unresulted Labs (From admission, onward)     Start     Ordered   03/30/23 0500  Basic metabolic panel  Tomorrow morning,   R        03/29/23 0919   03/30/23 0500  CBC with Differential/Platelet  Tomorrow morning,   R        03/29/23 0919           Total time spent in review of labs and imaging, patient evaluation, formulation of plan, documentation and communication with family: 45 minutes  Signed, Chapman Rota, MD [facility] 03/29/2023

## 2023-03-29 NOTE — Plan of Care (Signed)

## 2023-03-29 NOTE — Progress Notes (Signed)
 In talking with Dr. Julio Ohm, Plan for hardware removal Monday with Dr. Guyann Leitz. I will set up revascularization Tuesday, with plans for further musculoskeletal intervention Wednesday pending blood flow findings.   Kayla Part MD

## 2023-03-30 ENCOUNTER — Inpatient Hospital Stay (HOSPITAL_COMMUNITY): Payer: Medicare Other

## 2023-03-30 DIAGNOSIS — J101 Influenza due to other identified influenza virus with other respiratory manifestations: Secondary | ICD-10-CM | POA: Diagnosis not present

## 2023-03-30 LAB — CBC WITH DIFFERENTIAL/PLATELET
Abs Immature Granulocytes: 0.05 10*3/uL (ref 0.00–0.07)
Basophils Absolute: 0.1 10*3/uL (ref 0.0–0.1)
Basophils Relative: 1 %
Eosinophils Absolute: 0.5 10*3/uL (ref 0.0–0.5)
Eosinophils Relative: 7 %
HCT: 32.1 % — ABNORMAL LOW (ref 39.0–52.0)
Hemoglobin: 11.3 g/dL — ABNORMAL LOW (ref 13.0–17.0)
Immature Granulocytes: 1 %
Lymphocytes Relative: 21 %
Lymphs Abs: 1.6 10*3/uL (ref 0.7–4.0)
MCH: 29.7 pg (ref 26.0–34.0)
MCHC: 35.2 g/dL (ref 30.0–36.0)
MCV: 84.3 fL (ref 80.0–100.0)
Monocytes Absolute: 1 10*3/uL (ref 0.1–1.0)
Monocytes Relative: 13 %
Neutro Abs: 4.5 10*3/uL (ref 1.7–7.7)
Neutrophils Relative %: 57 %
Platelets: 192 10*3/uL (ref 150–400)
RBC: 3.81 MIL/uL — ABNORMAL LOW (ref 4.22–5.81)
RDW: 13.6 % (ref 11.5–15.5)
WBC: 7.7 10*3/uL (ref 4.0–10.5)
nRBC: 0 % (ref 0.0–0.2)

## 2023-03-30 LAB — BASIC METABOLIC PANEL
Anion gap: 12 (ref 5–15)
BUN: 27 mg/dL — ABNORMAL HIGH (ref 8–23)
CO2: 20 mmol/L — ABNORMAL LOW (ref 22–32)
Calcium: 8.5 mg/dL — ABNORMAL LOW (ref 8.9–10.3)
Chloride: 104 mmol/L (ref 98–111)
Creatinine, Ser: 2.89 mg/dL — ABNORMAL HIGH (ref 0.61–1.24)
GFR, Estimated: 23 mL/min — ABNORMAL LOW (ref >60)
Glucose, Bld: 175 mg/dL — ABNORMAL HIGH (ref 70–99)
Potassium: 3.3 mmol/L — ABNORMAL LOW (ref 3.5–5.1)
Sodium: 136 mmol/L (ref 135–145)

## 2023-03-30 LAB — GLUCOSE, CAPILLARY
Glucose-Capillary: 165 mg/dL — ABNORMAL HIGH (ref 70–99)
Glucose-Capillary: 179 mg/dL — ABNORMAL HIGH (ref 70–99)
Glucose-Capillary: 195 mg/dL — ABNORMAL HIGH (ref 70–99)
Glucose-Capillary: 208 mg/dL — ABNORMAL HIGH (ref 70–99)

## 2023-03-30 MED ORDER — POTASSIUM CHLORIDE CRYS ER 20 MEQ PO TBCR
40.0000 meq | EXTENDED_RELEASE_TABLET | ORAL | Status: AC
  Administered 2023-03-30 (×2): 40 meq via ORAL
  Filled 2023-03-30 (×2): qty 2

## 2023-03-30 MED ORDER — GABAPENTIN 400 MG PO CAPS
400.0000 mg | ORAL_CAPSULE | Freq: Three times a day (TID) | ORAL | Status: DC
  Administered 2023-03-30 – 2023-04-19 (×60): 400 mg via ORAL
  Filled 2023-03-30 (×62): qty 1

## 2023-03-30 NOTE — Progress Notes (Signed)
 Placed patient on CPAP for the night with pressure set at 5cmh20

## 2023-03-30 NOTE — Plan of Care (Signed)

## 2023-03-30 NOTE — Progress Notes (Signed)
 PROGRESS NOTE  Harold [name]  DOB: Feb 19, 1955  PCP: Amin, Saad, MD FMW:[id number]  DOA: 03/27/2023  LOS: 3 days  Hospital Day: 4  Brief narrative: Harold [name] is a 68 y.o. male with PMH significant for morbid obesity, OSA, DM2, HTN, HLD, CAD/stent, A-fib on Eliquis , carotid artery stenosis, CKD, chronic anemia, anxiety/depression, diabetic neuropathy, GERD, hypothyroidism, COPD, pulmonary hypertension. 2/6, patient presented to the ED with complaint of cough, subjective fever, myalgia, generalized weakness for 5 days.   In the ED, patient was afebrile, blood pressure elevated to 170s to 190s Initial labs with CBC unremarkable, BMP with BUN/creatinine 22/2.73 Respiratory virus panel positive for influenza A. Chest x-ray negative for infiltrates. Right tibia-fibula x-ray showed soft tissue swelling most pronounced over the lateral malleolus. EDP discussed with orthopedics Admitted to [facility]  Subjective: Patient was seen and examined this morning.  Propped up in bed.  Not in distress.  Alert, awake, oriented x 3 Labs this morning with potassium low at 3.3, creatinine up at 2.89 today. I asked him about the dose of gabapentin .  He is not sure if he just says he takes twice a day 2 pills. Follows up with [facility]  Assessment and plan: Right foot cellulitis In the setting of uncontrolled diabetes. In November 2024, he underwent ORIF of the right ankle, with ensuing revision in December 2024.  He has subsequently been followed by wound care at home.  However over the course the last 1 week, patient had progressive right lateral malleolus erythema, swelling, tenderness, with some evidence of purulent drainage, X-ray showed soft tissue swelling most pronounced over the lateral malleolus, consistent with cellulitis, without evidence of subcutaneous gas or crepitus to suggest necrotizing fasciitis.  Orthopedics and vascular surgery consult appreciated. Noted a plan of hardware  removal on Monday and angiogram on Tuesday Currently on broad-spectrum antibiotic coverage  Influenza A Presented with 5 days of cough, fever, myalgia Respiratory virus panel positive for influenza A Chest x-ray without acute infiltrate No fever, no WBC count Was outside the 48-hour window for Tamiflu and hence was not initiated Currently on supportive care with scheduled Mucinex , as needed Tessalon  Perles, incentive spirometry Respiratory status stable. Recent Labs  Lab 03/27/23 1832 03/28/23 0447 03/29/23 0407 03/30/23 0501  WBC 7.4 7.8 6.4 7.7  PROCALCITON  --  0.31  --   --    Type 2 diabetes mellitus  Diabetic neuropathy A1c 7 on 01/02/2023 PTA meds-Tresiba  60 units daily, Mounjaro  Currently on Lantus  30 units daily and SSI/Accu-Cheks.  Continue to monitor  PTA on gabapentin  1600 mg 3 times daily???.  Seems very high-dose.  Unable to confirm from the patient Currently on 1200 mg 3 times daily.  Given worsening renal function, gabapentin  to 400 mg 3 times daily. Recent Labs  Lab 03/29/23 0550 03/29/23 1133 03/29/23 1700 03/30/23 0005 03/30/23 0541  GLUCAP 191* 217* 264* 179* 165*   AKI on CKD 4 Baseline creatinine 2.13 from December 2024.  Creatinine gradually worsening.  Lasix  on hold. Continue to monitor for now. Follows up with [facility] as an outpatient Recent Labs    01/09/23 0004 01/09/23 0622 01/10/23 0725 01/11/23 0528 01/12/23 0609 01/28/23 0906 03/27/23 1832 03/28/23 0447 03/29/23 0407 03/30/23 0501  BUN 31* 31* 35* 31* 32* 18 22 23  25* 27*  CREATININE 2.10* 2.18* 2.85* 2.48* 2.54* 2.13* 2.73* 2.96* 2.61* 2.89*   Hypokalemia Potassium level low at 3.3 today.  Although better than yesterday, still needs replacement.  Ordered Recent Labs  Lab 03/27/23 1832 03/28/23 0447 03/29/23 0407 03/30/23 0501  K 3.5 2.9* 2.9* 3.3*  MG 1.9 1.9  --   --    Paroxysmal A-fib PTA meds- Coreg , amiodarone  Currently continued on both Chronically  anticoagulated with Eliquis .  Currently on hold in preparation of surgery Patient is on normal sinus rhythm and hence not started on any bridging while off Eliquis   Essential hypertension PTA meds- Coreg , Lasix  Continue Coreg . keep Lasix  on hold IV hydralazine  as needed  CAD/stent Carotid artery stenosis HLD PTA meds -Eliquis , Plavix , Crestor  Continue Crestor .  Eliquis  and Plavix  on hold for procedures  Hypothyroidism Continue Synthroid   Morbid Obesity  Body mass index is 40.17 kg/m. Patient has been advised to make an attempt to improve diet and exercise patterns to aid in weight loss.  Obstructive sleep apnea Patient reported good compliance on home nocturnal CPAP  Gout Allopurinol , colchicine   Depression Cymbalta    Mobility: Needs PT eval postprocedure  Goals of care   Code Status: Full Code     DVT prophylaxis:  SCDs Start: 03/27/23 2243   Antimicrobials: Broad-spectrum IV antibiotics Fluid: None Consultants: Orthopedics, vascular surgery Family Communication: None at bedside  Status: Inpatient Level of care:  Telemetry Medical   Patient is from: Home Needs to continue in-hospital care: Pending clinical course, pending procedure on Monday and Tuesday Anticipated d/c to: Pending clinical course    Diet:  Diet Order             Diet heart healthy/carb modified Room service appropriate? Yes; Fluid consistency: Thin  Diet effective now                   Scheduled Meds:  allopurinol   100 mg Oral Daily   amiodarone   200 mg Oral BID   carvedilol   3.125 mg Oral BID WC   DULoxetine   60 mg Oral Daily   ezetimibe   10 mg Oral Daily   gabapentin   400 mg Oral TID   guaiFENesin   600 mg Oral BID   insulin  aspart  0-9 Units Subcutaneous Q6H   insulin  glargine-yfgn  30 Units Subcutaneous Daily   levothyroxine   88 mcg Oral QAC breakfast   metroNIDAZOLE   500 mg Oral Q12H   potassium chloride   40 mEq Oral Q4H   rosuvastatin   20 mg Oral QPM    PRN  meds: acetaminophen  **OR** acetaminophen , benzonatate , fentaNYL  (SUBLIMAZE ) injection, melatonin, naLOXone  (NARCAN )  injection, ondansetron  (ZOFRAN ) IV   Infusions:   ceFEPime  (MAXIPIME ) IV 2 g (03/29/23 2130)   vancomycin  2,000 mg (03/29/23 2222)    Antimicrobials: Anti-infectives (From admission, onward)    Start     Dose/Rate Route Frequency Ordered Stop   03/29/23 2200  vancomycin  (VANCOREADY) IVPB 2000 mg/400 mL       Placed in Followed by Linked Group   2,000 mg 200 mL/hr over 120 Minutes Intravenous Every 48 hours 03/27/23 2257     03/28/23 1045  metroNIDAZOLE  (FLAGYL ) tablet 500 mg        500 mg Oral Every 12 hours 03/28/23 0955     03/27/23 2330  vancomycin  (VANCOCIN ) 2,500 mg in sodium chloride  0.9 % 500 mL IVPB       Placed in Followed by Linked Group   2,500 mg 262.5 mL/hr over 120 Minutes Intravenous  Once 03/27/23 2257 03/28/23 0241   03/27/23 2300  metroNIDAZOLE  (FLAGYL ) IVPB 500 mg  Status:  Discontinued        500 mg 100 mL/hr over 60 Minutes Intravenous  Every 12 hours 03/27/23 2247 03/28/23 0955   03/27/23 2300  ceFEPIme  (MAXIPIME ) 2 g in sodium chloride  0.9 % 100 mL IVPB        2 g 200 mL/hr over 30 Minutes Intravenous Every 12 hours 03/27/23 2257     03/27/23 2100  ceFAZolin  (ANCEF ) IVPB 2g/100 mL premix        2 g 200 mL/hr over 30 Minutes Intravenous  Once 03/27/23 2058 03/27/23 2219       Objective: Vitals:   03/30/23 0545 03/30/23 0804  BP: 137/64 (!) 141/64  Pulse: (!) 56 62  Resp: 18   Temp: 98 F (36.7 C) 97.6 F (36.4 C)  SpO2: 95% 98%    Intake/Output Summary (Last 24 hours) at 03/30/2023 0932 Last data filed at 03/30/2023 0921 Gross per 24 hour  Intake 480 ml  Output 1100 ml  Net -620 ml   Filed Weights   03/30/23 0545  Weight: (!) 138.1 kg   Weight change:  Body mass index is 40.17 kg/m.   Physical Exam: General exam: Pleasant, elderly Caucasian male Skin: No rashes, lesions or ulcers. HEENT: Atraumatic, normocephalic, no  obvious bleeding Lungs: Clear to auscultation bilaterally, on CPAP CVS: S1, S2, no murmur,   GI/Abd: Soft, nontender, nondistended, bowel sound present,   CNS: Alert, awake, oriented to place and person. Psychiatry: Mood appropriate,  Extremities: Trace bilateral pedal edema, no calf tenderness, right foot has bandage on  Data Review: I have personally reviewed the laboratory data and studies available.  F/u labs ordered Unresulted Labs (From admission, onward)    None      Total time spent in review of labs and imaging, patient evaluation, formulation of plan, documentation and communication with family: 45 minutes  Signed, Chapman Rota, MD [facility] 03/30/2023

## 2023-03-30 NOTE — Progress Notes (Signed)
 Pharmacy Antibiotic Note  Harold [name] is a 68 y.o. male for which pharmacy has been consulted for cefepime  and vancomycin  dosing for  infected diabetic foot wound with ankle hardware .  Patient with a history of HTN, HLD, CAD s/p PCI, A-fib on Eliquis , COPD, OSA, T2DM, CKD, PAD s/p stents. Patient presenting with cough x 3 days. Plans for orthopedic intervention on 03/31/23, vascular intervention 04/01/23. Anticipate cultures at that time. Patient will be status post 4 days of antibiotics, which may effect culture yield.   Current dose of Vancomycin  2000 mg IV q48h with eAUC 535.9, Cmin 11.5, T1/2 25.6 hrs Parameters used: Scr 2.89, IBW to calculate CrCl + Ke, Vd coefficient 0.5  Plan: Continue Cefepime  2g q12hr; Flagyl  per MD Continue Vancomycin  2000 mg q48hr (eAUC 535.9) unless significant change in renal function Monitor WBC, fever, renal function, cultures De-escalate when able Levels at steady state  Weight: (!) 138.1 kg (304 lb 7.3 oz)  Temp (24hrs), Avg:98.1 F (36.7 C), Min:97.6 F (36.4 C), Max:98.6 F (37 C)  Recent Labs  Lab 03/27/23 1832 03/28/23 0447 03/29/23 0407 03/30/23 0501  WBC 7.4 7.8 6.4 7.7  CREATININE 2.73* 2.96* 2.61* 2.89*    Estimated Creatinine Clearance: 35.2 mL/min (A) (by C-G formula based on SCr of 2.89 mg/dL (H)).    Allergies  Allergen Reactions   Doxycycline  Hives and Rash   Septra [Bactrim] Itching   Penicillins Rash    Allergy to All cillin drugs  Ancef  given 9/24 with no obvious reaction   Metformin  And Related Diarrhea and Nausea Only   Other Nausea And Vomiting    General Anesthesia - sometimes causes nausea and vomiting * OK to use scopolamine  patch     Sulfamethoxazole-Trimethoprim Other (See Comments)   Wellbutrin [Bupropion]     Mood Changes   Microbiology results: Pending  Thank you for allowing pharmacy to be a part of this patient's care.  Con Laughter, PharmD PGY-2 Infectious Diseases Pharmacy Resident [facility] 03/30/2023 10:16 AM

## 2023-03-31 ENCOUNTER — Inpatient Hospital Stay (HOSPITAL_COMMUNITY): Payer: Medicare Other | Admitting: Anesthesiology

## 2023-03-31 ENCOUNTER — Encounter (HOSPITAL_COMMUNITY)
Admission: EM | Disposition: A | Discharge status: Home Health | Payer: Self-pay | Source: Home / Self Care | Attending: Family Medicine

## 2023-03-31 ENCOUNTER — Other Ambulatory Visit: Payer: Self-pay

## 2023-03-31 ENCOUNTER — Inpatient Hospital Stay (HOSPITAL_COMMUNITY): Payer: Medicare Other

## 2023-03-31 ENCOUNTER — Encounter (HOSPITAL_COMMUNITY): Payer: Self-pay | Admitting: Internal Medicine

## 2023-03-31 DIAGNOSIS — I2510 Atherosclerotic heart disease of native coronary artery without angina pectoris: Secondary | ICD-10-CM

## 2023-03-31 DIAGNOSIS — I10 Essential (primary) hypertension: Secondary | ICD-10-CM | POA: Diagnosis not present

## 2023-03-31 DIAGNOSIS — S82851A Displaced trimalleolar fracture of right lower leg, initial encounter for closed fracture: Secondary | ICD-10-CM

## 2023-03-31 DIAGNOSIS — T8469XA Infection and inflammatory reaction due to internal fixation device of other site, initial encounter: Secondary | ICD-10-CM

## 2023-03-31 DIAGNOSIS — J101 Influenza due to other identified influenza virus with other respiratory manifestations: Secondary | ICD-10-CM | POA: Diagnosis not present

## 2023-03-31 HISTORY — PX: HARDWARE REMOVAL: SHX979

## 2023-03-31 HISTORY — PX: APPLICATION OF WOUND VAC: SHX5189

## 2023-03-31 LAB — SURGICAL PCR SCREEN
MRSA, PCR: NEGATIVE
Staphylococcus aureus: NEGATIVE

## 2023-03-31 LAB — BASIC METABOLIC PANEL
Anion gap: 9 (ref 5–15)
BUN: 26 mg/dL — ABNORMAL HIGH (ref 8–23)
CO2: 22 mmol/L (ref 22–32)
Calcium: 8.7 mg/dL — ABNORMAL LOW (ref 8.9–10.3)
Chloride: 107 mmol/L (ref 98–111)
Creatinine, Ser: 2.58 mg/dL — ABNORMAL HIGH (ref 0.61–1.24)
GFR, Estimated: 26 mL/min — ABNORMAL LOW (ref >60)
Glucose, Bld: 169 mg/dL — ABNORMAL HIGH (ref 70–99)
Potassium: 3.5 mmol/L (ref 3.5–5.1)
Sodium: 138 mmol/L (ref 135–145)

## 2023-03-31 LAB — GLUCOSE, CAPILLARY
Glucose-Capillary: 102 mg/dL — ABNORMAL HIGH (ref 70–99)
Glucose-Capillary: 123 mg/dL — ABNORMAL HIGH (ref 70–99)
Glucose-Capillary: 151 mg/dL — ABNORMAL HIGH (ref 70–99)
Glucose-Capillary: 161 mg/dL — ABNORMAL HIGH (ref 70–99)
Glucose-Capillary: 166 mg/dL — ABNORMAL HIGH (ref 70–99)
Glucose-Capillary: 167 mg/dL — ABNORMAL HIGH (ref 70–99)

## 2023-03-31 SURGERY — REMOVAL, HARDWARE
Anesthesia: General | Site: Ankle | Laterality: Right

## 2023-03-31 MED ORDER — HYDROMORPHONE HCL 1 MG/ML IJ SOLN
0.5000 mg | INTRAMUSCULAR | Status: DC | PRN
  Administered 2023-03-31 – 2023-04-17 (×8): 0.5 mg via INTRAVENOUS
  Filled 2023-03-31 (×2): qty 1
  Filled 2023-03-31: qty 0.5
  Filled 2023-03-31: qty 1
  Filled 2023-03-31: qty 0.5
  Filled 2023-03-31 (×3): qty 1

## 2023-03-31 MED ORDER — OXYCODONE HCL 5 MG PO TABS
5.0000 mg | ORAL_TABLET | ORAL | Status: DC | PRN
  Administered 2023-04-01 – 2023-04-17 (×20): 5 mg via ORAL
  Filled 2023-03-31 (×22): qty 1

## 2023-03-31 MED ORDER — PHENYLEPHRINE 80 MCG/ML (10ML) SYRINGE FOR IV PUSH (FOR BLOOD PRESSURE SUPPORT)
PREFILLED_SYRINGE | INTRAVENOUS | Status: DC | PRN
  Administered 2023-03-31: 160 ug via INTRAVENOUS

## 2023-03-31 MED ORDER — EPHEDRINE SULFATE-NACL 50-0.9 MG/10ML-% IV SOSY
PREFILLED_SYRINGE | INTRAVENOUS | Status: DC | PRN
  Administered 2023-03-31: 15 mg via INTRAVENOUS
  Administered 2023-03-31: 10 mg via INTRAVENOUS

## 2023-03-31 MED ORDER — METOCLOPRAMIDE HCL 5 MG/ML IJ SOLN: 5.0000 mg | Freq: Three times a day (TID) | INTRAMUSCULAR | Status: DC | PRN

## 2023-03-31 MED ORDER — PHENYLEPHRINE HCL-NACL 20-0.9 MG/250ML-% IV SOLN
INTRAVENOUS | Status: DC | PRN
  Administered 2023-03-31: 60 ug/min via INTRAVENOUS

## 2023-03-31 MED ORDER — POLYETHYLENE GLYCOL 3350 17 G PO PACK: 17.0000 g | PACK | Freq: Every day | ORAL | Status: DC | PRN

## 2023-03-31 MED ORDER — CHLORHEXIDINE GLUCONATE 0.12 % MT SOLN
15.0000 mL | Freq: Once | OROMUCOSAL | Status: AC
  Administered 2023-03-31: 15 mL via OROMUCOSAL

## 2023-03-31 MED ORDER — ORAL CARE MOUTH RINSE: 15.0000 mL | Freq: Once | OROMUCOSAL | Status: AC

## 2023-03-31 MED ORDER — DOCUSATE SODIUM 100 MG PO CAPS
100.0000 mg | ORAL_CAPSULE | Freq: Two times a day (BID) | ORAL | Status: DC
  Administered 2023-03-31 – 2023-05-09 (×57): 100 mg via ORAL
  Filled 2023-03-31 (×73): qty 1

## 2023-03-31 MED ORDER — MIDAZOLAM HCL 2 MG/2ML IJ SOLN
INTRAMUSCULAR | Status: DC | PRN
  Administered 2023-03-31: 1 mg via INTRAVENOUS

## 2023-03-31 MED ORDER — METOCLOPRAMIDE HCL 5 MG PO TABS: 5.0000 mg | ORAL_TABLET | Freq: Three times a day (TID) | ORAL | Status: DC | PRN

## 2023-03-31 MED ORDER — SUCCINYLCHOLINE CHLORIDE 200 MG/10ML IV SOSY
PREFILLED_SYRINGE | INTRAVENOUS | Status: DC | PRN
  Administered 2023-03-31: 180 mg via INTRAVENOUS

## 2023-03-31 MED ORDER — PROPOFOL 10 MG/ML IV BOLUS
INTRAVENOUS | Status: DC | PRN
  Administered 2023-03-31: 150 mg via INTRAVENOUS

## 2023-03-31 MED ORDER — ACETAMINOPHEN 500 MG PO TABS
1000.0000 mg | ORAL_TABLET | Freq: Once | ORAL | Status: AC
  Administered 2023-03-31: 1000 mg via ORAL
  Filled 2023-03-31: qty 2

## 2023-03-31 MED ORDER — SODIUM CHLORIDE 0.9 % IV SOLN: INTRAVENOUS | Status: DC

## 2023-03-31 MED ORDER — FENTANYL CITRATE (PF) 250 MCG/5ML IJ SOLN
INTRAMUSCULAR | Status: AC
  Filled 2023-03-31: qty 5

## 2023-03-31 MED ORDER — METHOCARBAMOL 500 MG PO TABS
500.0000 mg | ORAL_TABLET | Freq: Four times a day (QID) | ORAL | Status: DC | PRN
  Administered 2023-03-31 – 2023-04-16 (×14): 500 mg via ORAL
  Filled 2023-03-31 (×16): qty 1

## 2023-03-31 MED ORDER — MIDAZOLAM HCL 2 MG/2ML IJ SOLN
INTRAMUSCULAR | Status: AC
  Filled 2023-03-31: qty 2

## 2023-03-31 MED ORDER — PROPOFOL 10 MG/ML IV BOLUS
INTRAVENOUS | Status: AC
  Filled 2023-03-31: qty 20

## 2023-03-31 MED ORDER — CEFAZOLIN SODIUM-DEXTROSE 2-4 GM/100ML-% IV SOLN
2.0000 g | INTRAVENOUS | Status: AC
  Administered 2023-03-31: 2 g via INTRAVENOUS
  Filled 2023-03-31: qty 100

## 2023-03-31 MED ORDER — LIDOCAINE 2% (20 MG/ML) 5 ML SYRINGE
INTRAMUSCULAR | Status: DC | PRN
  Administered 2023-03-31: 60 mg via INTRAVENOUS

## 2023-03-31 MED ORDER — FENTANYL CITRATE (PF) 250 MCG/5ML IJ SOLN
INTRAMUSCULAR | Status: DC | PRN
  Administered 2023-03-31: 100 ug via INTRAVENOUS

## 2023-03-31 MED ORDER — VANCOMYCIN HCL 1000 MG IV SOLR
INTRAVENOUS | Status: AC
  Filled 2023-03-31: qty 20

## 2023-03-31 MED ORDER — ONDANSETRON HCL 4 MG/2ML IJ SOLN
INTRAMUSCULAR | Status: DC | PRN
  Administered 2023-03-31: 4 mg via INTRAVENOUS

## 2023-03-31 MED ORDER — TOBRAMYCIN SULFATE 1.2 G IJ SOLR
INTRAMUSCULAR | Status: AC
  Filled 2023-03-31: qty 1.2

## 2023-03-31 MED ORDER — 0.9 % SODIUM CHLORIDE (POUR BTL) OPTIME
TOPICAL | Status: DC | PRN
  Administered 2023-03-31: 1000 mL

## 2023-03-31 MED ORDER — METHOCARBAMOL 1000 MG/10ML IJ SOLN: 500.0000 mg | Freq: Four times a day (QID) | INTRAMUSCULAR | Status: DC | PRN

## 2023-03-31 MED ORDER — INSULIN ASPART 100 UNIT/ML IJ SOLN
0.0000 [IU] | INTRAMUSCULAR | Status: DC | PRN
  Administered 2023-03-31: 2 [IU] via SUBCUTANEOUS

## 2023-03-31 SURGICAL SUPPLY — 60 items
BAG COUNTER SPONGE SURGICOUNT (BAG) ×1 IMPLANT
BANDAGE ESMARK 6X9 LF (GAUZE/BANDAGES/DRESSINGS) ×1 IMPLANT
BNDG COHESIVE 6X5 TAN ST LF (GAUZE/BANDAGES/DRESSINGS) ×1 IMPLANT
BNDG ELASTIC 4INX 5YD STR LF (GAUZE/BANDAGES/DRESSINGS) IMPLANT
BNDG ELASTIC 4X5.8 VLCR STR LF (GAUZE/BANDAGES/DRESSINGS) ×1 IMPLANT
BNDG ELASTIC 6INX 5YD STR LF (GAUZE/BANDAGES/DRESSINGS) IMPLANT
BNDG ELASTIC 6X5.8 VLCR STR LF (GAUZE/BANDAGES/DRESSINGS) ×1 IMPLANT
BNDG ESMARK 6X9 LF (GAUZE/BANDAGES/DRESSINGS) ×1 IMPLANT
BNDG GAUZE DERMACEA FLUFF 4 (GAUZE/BANDAGES/DRESSINGS) ×2 IMPLANT
BRUSH SCRUB EZ PLAIN DRY (MISCELLANEOUS) ×2 IMPLANT
CANISTER WOUNDNEG PRESSURE 500 (CANNISTER) IMPLANT
CHLORAPREP W/TINT 26 (MISCELLANEOUS) ×1 IMPLANT
COVER SURGICAL LIGHT HANDLE (MISCELLANEOUS) ×2 IMPLANT
CUFF TOURN SGL QUICK 18X4 (TOURNIQUET CUFF) IMPLANT
CUFF TRNQT CYL 24X4X16.5-23 (TOURNIQUET CUFF) IMPLANT
CUFF TRNQT CYL 34X4.125X (TOURNIQUET CUFF) IMPLANT
DRAPE C-ARM 42X72 X-RAY (DRAPES) IMPLANT
DRAPE C-ARMOR (DRAPES) ×1 IMPLANT
DRAPE INCISE IOBAN 66X45 STRL (DRAPES) IMPLANT
DRAPE U-SHAPE 47X51 STRL (DRAPES) ×1 IMPLANT
DRSG ADAPTIC 3X8 NADH LF (GAUZE/BANDAGES/DRESSINGS) ×1 IMPLANT
DRSG MEPITEL 4X7.2 (GAUZE/BANDAGES/DRESSINGS) IMPLANT
DRSG VAC GRANUFOAM MED (GAUZE/BANDAGES/DRESSINGS) IMPLANT
ELECT REM PT RETURN 9FT ADLT (ELECTROSURGICAL) ×1 IMPLANT
ELECTRODE REM PT RTRN 9FT ADLT (ELECTROSURGICAL) ×1 IMPLANT
GAUZE SPONGE 4X4 12PLY STRL (GAUZE/BANDAGES/DRESSINGS) ×1 IMPLANT
GLOVE BIO SURGEON STRL SZ 6.5 (GLOVE) ×3 IMPLANT
GLOVE BIO SURGEON STRL SZ7.5 (GLOVE) ×4 IMPLANT
GLOVE BIOGEL PI IND STRL 6.5 (GLOVE) ×1 IMPLANT
GLOVE BIOGEL PI IND STRL 7.5 (GLOVE) ×1 IMPLANT
GOWN STRL REUS W/ TWL LRG LVL3 (GOWN DISPOSABLE) ×2 IMPLANT
KIT BASIN OR (CUSTOM PROCEDURE TRAY) ×1 IMPLANT
KIT TURNOVER KIT B (KITS) ×1 IMPLANT
MANIFOLD NEPTUNE II (INSTRUMENTS) ×1 IMPLANT
NDL 22X1.5 STRL (OR ONLY) (MISCELLANEOUS) IMPLANT
NEEDLE 22X1.5 STRL (OR ONLY) (MISCELLANEOUS) IMPLANT
NS IRRIG 1000ML POUR BTL (IV SOLUTION) ×1 IMPLANT
PACK ORTHO EXTREMITY (CUSTOM PROCEDURE TRAY) ×1 IMPLANT
PAD ARMBOARD 7.5X6 YLW CONV (MISCELLANEOUS) ×2 IMPLANT
PAD CAST 4YDX4 CTTN HI CHSV (CAST SUPPLIES) IMPLANT
PADDING CAST COTTON 6X4 STRL (CAST SUPPLIES) ×3 IMPLANT
SPONGE T-LAP 18X18 ~~LOC~~+RFID (SPONGE) ×1 IMPLANT
STAPLER VISISTAT 35W (STAPLE) IMPLANT
STOCKINETTE IMPERVIOUS LG (DRAPES) ×1 IMPLANT
STRIP CLOSURE SKIN 1/2X4 (GAUZE/BANDAGES/DRESSINGS) IMPLANT
SUCTION TUBE FRAZIER 10FR DISP (SUCTIONS) IMPLANT
SUT ETHILON 3 0 FSL (SUTURE) IMPLANT
SUT ETHILON 3 0 PS 1 (SUTURE) IMPLANT
SUT MNCRL AB 3-0 PS2 18 (SUTURE) ×1 IMPLANT
SUT MON AB 2-0 CT1 36 (SUTURE) ×1 IMPLANT
SUT PDS AB 2-0 CT1 27 (SUTURE) IMPLANT
SUT VIC AB 0 CT1 27XBRD ANBCTR (SUTURE) IMPLANT
SUT VIC AB 2-0 CT1 TAPERPNT 27 (SUTURE) IMPLANT
SYR CONTROL 10ML LL (SYRINGE) IMPLANT
TOWEL GREEN STERILE (TOWEL DISPOSABLE) ×2 IMPLANT
TOWEL GREEN STERILE FF (TOWEL DISPOSABLE) ×2 IMPLANT
TUBE CONNECTING 12X1/4 (SUCTIONS) ×1 IMPLANT
UNDERPAD 30X36 HEAVY ABSORB (UNDERPADS AND DIAPERS) ×1 IMPLANT
WATER STERILE IRR 1000ML POUR (IV SOLUTION) ×2 IMPLANT
YANKAUER SUCT BULB TIP NO VENT (SUCTIONS) ×1 IMPLANT

## 2023-03-31 NOTE — Progress Notes (Signed)
 Orthopedic Tech Progress Note Patient Details:  TIEN KHOSHABA 07/09/1955 [id number]  Called in order to [facility] for a NIGHT SPLINT   Patient ID: Harold [name], male   DOB: 12-Feb-1956, 68 y.o.   MRN: [id number]  Kermitt Pedlar 03/31/2023, 4:47 PM

## 2023-03-31 NOTE — Anesthesia Procedure Notes (Signed)
 Procedure Name: Intubation Date/Time: 03/31/2023 11:27 AM  Performed by: Pasty Bongo, CRNAPre-anesthesia Checklist: Patient identified, Emergency Drugs available, Suction available and Patient being monitored Patient Re-evaluated:Patient Re-evaluated prior to induction Oxygen Delivery Method: Circle System Utilized Preoxygenation: Pre-oxygenation with 100% oxygen Induction Type: IV induction and Rapid sequence Laryngoscope Size: Glidescope and 4 Grade View: Grade I Tube type: Oral Number of attempts: 1 Airway Equipment and Method: Stylet and Oral airway Placement Confirmation: ETT inserted through vocal cords under direct vision, positive ETCO2 and breath sounds checked- equal and bilateral Secured at: 24 cm Tube secured with: Tape Dental Injury: Teeth and Oropharynx as per pre-operative assessment

## 2023-03-31 NOTE — Plan of Care (Signed)

## 2023-03-31 NOTE — H&P (View-Only) (Signed)
 Ortho Trauma Note  I have reviewed the patient's chart and I discussed his case with Dr. Guyann Leitz.  Patient will require removal of hardware with possible wound VAC placement prior to his angiography and possible stenting with vascular tomorrow.  Depending on results of today he will likely go back to the operating room later in the week with Dr. Julio Ohm.  I discussed this with the patient.  He is agreeable to proceed.  Consent was obtained.  Laneta Pintos, MD [facility] [phone] (office) [email or web address]

## 2023-03-31 NOTE — Progress Notes (Signed)
  Progress Note    03/31/2023 9:29 AM * Day of Surgery *  Subjective:  no complaints   Vitals:   03/30/23 2228 03/31/23 0753  BP:  (!) 152/69  Pulse: 75 (!) 56  Resp: 18 19  Temp:  97.8 F (36.6 C)  SpO2: 95% 98%   Physical Exam: Lungs:  non labored Extremities:  R ankle dressing left in place Neurologic: A&O  CBC    Component Value Date/Time   WBC 7.7 03/30/2023 0501   RBC 3.81 (L) 03/30/2023 0501   HGB 11.3 (L) 03/30/2023 0501   HGB 14.6 11/11/2022 1522   HCT 32.1 (L) 03/30/2023 0501   HCT 43.8 11/11/2022 1522   PLT 192 03/30/2023 0501   PLT 169 10/29/2022 1203   MCV 84.3 03/30/2023 0501   MCV 96 10/29/2022 1203   MCH 29.7 03/30/2023 0501   MCHC 35.2 03/30/2023 0501   RDW 13.6 03/30/2023 0501   RDW 14.5 10/29/2022 1203   LYMPHSABS 1.6 03/30/2023 0501   LYMPHSABS 1.6 05/12/2018 1150   MONOABS 1.0 03/30/2023 0501   EOSABS 0.5 03/30/2023 0501   EOSABS 0.2 05/12/2018 1150   BASOSABS 0.1 03/30/2023 0501   BASOSABS 0.1 05/12/2018 1150    BMET    Component Value Date/Time   NA 138 03/31/2023 0828   NA 141 11/14/2022 1624   K 3.5 03/31/2023 0828   CL 107 03/31/2023 0828   CO2 22 03/31/2023 0828   GLUCOSE 169 (H) 03/31/2023 0828   BUN 26 (H) 03/31/2023 0828   BUN 23 11/14/2022 1624   CREATININE 2.58 (H) 03/31/2023 0828   CREATININE 1.51 (H) 10/16/2015 1053   CALCIUM  8.7 (L) 03/31/2023 0828   GFRNONAA 26 (L) 03/31/2023 0828   GFRNONAA 49 (L) 10/16/2015 1053   GFRAA 47 (L) 09/28/2019 1242   GFRAA 56 (L) 10/16/2015 1053    INR    Component Value Date/Time   INR 1.3 (H) 03/28/2023 0447     Intake/Output Summary (Last 24 hours) at 03/31/2023 0929 Last data filed at 03/30/2023 1700 Gross per 24 hour  Intake --  Output 400 ml  Net -400 ml     Assessment/Plan:  68 y.o. male with R ankle wound  Plans noted for R ankle hardware removal today.  He is also tentatively scheduled for Aortogram with RLE runoff and possible intervention tomorrow with Dr.  Charlotte Cookey.  Case was discussed and all questions were answered.  CKD stage IV currently.  We will perform angiogram with mostly CO2 however he is aware of risk of AKI and need for HD.    Cordie Deters, PA-C [facility] [phone] 03/31/2023 9:29 AM

## 2023-03-31 NOTE — Progress Notes (Signed)
 PT Cancellation Note  Patient Details Name: Harold [name] MRN: [id number] DOB: 27-Apr-1955   Cancelled Treatment:    Reason Eval/Treat Not Completed: Patient at procedure or test/unavailable Pt noted to be off the floor at this time. Will f/u as able.  Emaline Handsome, PT, DPT 03/31/23, 11:04 AM   [name] 03/31/2023, 11:04 AM

## 2023-03-31 NOTE — Anesthesia Preprocedure Evaluation (Addendum)
 Anesthesia Evaluation  Patient identified by MRN, date of birth, ID band Patient awake    Reviewed: Allergy & Precautions, NPO status , Patient's Chart, lab work & pertinent test results, reviewed documented beta blocker date and time   History of Anesthesia Complications (+) PONV and history of anesthetic complications  Airway Mallampati: IV  TM Distance: >3 FB Neck ROM: Full    Dental  (+) Teeth Intact, Dental Advisory Given   Pulmonary sleep apnea and Continuous Positive Airway Pressure Ventilation , COPD, former smoker Quit smoking 2005, 42 pack year history    Pulmonary exam normal breath sounds clear to auscultation       Cardiovascular hypertension (161/56 preop), Pt. on medications and Pt. on home beta blockers + CAD (s/p PCI 2003, 2014) and + Peripheral Vascular Disease  Normal cardiovascular exam+ dysrhythmias Atrial Fibrillation  Rhythm:Regular Rate:Normal  Echo 2024 1. EF challenging to assess with afib/ challenging windows. Left  ventricular ejection fraction, by estimation, is 50 to 55%. The left  ventricle has low normal function. The left ventricle demonstrates global  hypokinesis. There is mild concentric left  ventricular hypertrophy. Left ventricular diastolic parameters are  indeterminate.   2. Right ventricular systolic function is normal. The right ventricular  size is normal.   3. Left atrial size was mildly dilated.   4. Trivial mitral valve regurgitation.   5. There is mild calcification of the aortic valve. Aortic valve  regurgitation is not visualized.   6. There is mild dilatation of the ascending aorta, measuring 36 mm.   7. The inferior vena cava is normal in size with greater than 50%  respiratory variability, suggesting right atrial pressure of 3 mmHg.      Neuro/Psych  PSYCHIATRIC DISORDERS Anxiety Depression     Neuromuscular disease (diabetic neuropathy)    GI/Hepatic Neg liver ROS,GERD   Controlled and Medicated,,  Endo/Other  diabetes, Well Controlled, Type 1, Insulin  DependentHypothyroidism  Class 3 obesityBMI 42  Renal/GU CRFRenal diseaseCr 2.89  negative genitourinary   Musculoskeletal negative musculoskeletal ROS (+)    Abdominal  (+) + obese  Peds  Hematology  (+) Blood dyscrasia, anemia Hb 11.3, plt 192   Anesthesia Other Findings Mounjaro  LD: 6d  Reproductive/Obstetrics negative OB ROS                             Anesthesia Physical Anesthesia Plan  ASA: 4  Anesthesia Plan: General   Post-op Pain Management: Tylenol  PO (pre-op)*   Induction: Intravenous and Rapid sequence  PONV Risk Score and Plan: 3 and Ondansetron , Dexamethasone  and Treatment may vary due to age or medical condition  Airway Management Planned: Oral ETT and Video Laryngoscope Planned  Additional Equipment: None  Intra-op Plan:   Post-operative Plan: Extubation in OR  Informed Consent: I have reviewed the patients History and Physical, chart, labs and discussed the procedure including the risks, benefits and alternatives for the proposed anesthesia with the patient or authorized representative who has indicated his/her understanding and acceptance.     Dental advisory given  Plan Discussed with: CRNA  Anesthesia Plan Comments:         Anesthesia Quick Evaluation

## 2023-03-31 NOTE — Interval H&P Note (Signed)
 History and Physical Interval Note:  03/31/2023 10:06 AM  Harold [name]  has presented today for surgery, with the diagnosis of infection right ankle.  The various methods of treatment have been discussed with the patient and family. After consideration of risks, benefits and other options for treatment, the patient has consented to  Procedure(s): HARDWARE REMOVAL RIGHT ANKLE POSSIBLE WOUND VAC (Right) as a surgical intervention.  The patient's history has been reviewed, patient examined, no change in status, stable for surgery.  I have reviewed the patient's chart and labs.  Questions were answered to the patient's satisfaction.     [name] Benjy Kana

## 2023-03-31 NOTE — Op Note (Signed)
 Orthopaedic Surgery Operative Note (CSN: [id number] ) Date of Surgery: 03/27/2023 - 03/31/2023  Admit Date: 03/27/2023   Diagnoses: Pre-Op Diagnoses: Right leg wound breakdown/postoperative infection Right trimalleolar ankle fracture   Post-Op Diagnosis: Same  Procedures: CPT 20680-Removal of hardware right ankle CPT 10180-I&D of complex postoperative infection CPT 97605-Wound vac placement to right leg  Surgeons : Primary: Laneta Pintos, MD  Assistant: Delberta Fee, PA-C  Location: OR 9   Anesthesia: General   Antibiotics: Ancef  2g preop   Tourniquet time: None    Estimated Blood Loss: 100 mL  Complications:* No complications entered in OR log *   Specimens: ID Type Source Tests Collected by Time Destination  A : right ankle infection Tissue Path fluid AEROBIC/ANAEROBIC CULTURE W GRAM STAIN (SURGICAL/DEEP WOUND) Laneta Pintos, MD 03/31/2023 1154      Implants: Implant Name Type Inv. Item Serial No. Manufacturer Lot No. LRB No. Used Action  PLATE TUBULAR 1/3 10H - [id number] Plate PLATE TUBULAR 1/3 10H  [employer](ORTH,TRAU,BIO,SG)  Right 1 Explanted  SCREW CANN 4.0X60MM PART THRD - [id number] Screw SCREW CANN 4.0X60MM PART THRD  [employer](ORTH,TRAU,BIO,SG)  Right 1 Explanted  SCREW CANN 5.0X55 - [id number] Screw SCREW CANN 5.0X55  [employer](ORTH,TRAU,BIO,SG)  Right 1 Explanted  SCREW CANNULATED PT 4.0X55MM - [id number] Screw SCREW CANNULATED PT 4.0X55MM  [employer](ORTH,TRAU,BIO,SG)  Right 1 Explanted  SCREW LOCK MDS 3.5X12 - [id number] Screw SCREW LOCK MDS 3.5X12  [employer](ORTH,TRAU,BIO,SG)  Right 2 Explanted  SCREW NLOCK 3.5X55 - [id number] Screw SCREW NLOCK 3.5X55  [employer](ORTH,TRAU,BIO,SG)  Right 1 Explanted  SCREW NLOCK 3.5X60 - [id number] Screw SCREW NLOCK 3.5X60  [employer](ORTH,TRAU,BIO,SG)  Right 2 Explanted  SCREW NON-LOCK 3.5X12 - [id number] Screw SCREW NON-LOCK 3.5X12  [employer](ORTH,TRAU,BIO,SG)  Right 3 Explanted     Indications  for Surgery: 68 year old male who underwent open reduction internal fixation of his right trimalleolar ankle fracture dislocation with Dr. Guyann Leitz in November 2024.  He subsequently developed a wound breakdown and infection.  He will require revascularization of his right lower extremity and likely wound coverage but due to the potential exposed hardware as well as his infection he was indicated for removal of hardware and wound VAC placement.  Risks and benefits were discussed with the patient.  He agreed to proceed with surgery and consent was obtained.  Operative Findings: 1.  Successful removal of previous right ankle hardware without complication 2.  2 x 2 cm decubitus ulcer over the distal fibula treated with debridement and wound VAC placement that included the lateral wound for the fibula plate as well.  Procedure: The patient was identified in the preoperative holding area. Consent was confirmed with the patient and their family and all questions were answered. The operative extremity was marked after confirmation with the patient. he was then brought back to the operating room by our anesthesia colleagues.  He was placed under general anesthetic and carefully transferred over to a regular OR table.  The right lower extremity was then prepped and draped in usual sterile fashion a timeout was performed to verify the patient, the procedure and the extremity.  Preoperative antibiotics were dosed.  I for started out by identifying where the 2 x 2 lateral wound was which was just distal to the plate over the lateral fibula.  I then debrided this with a 15 blade and I opened up the previous surgical incision and carried it down through skin subcutaneous tissue.  I took care to protect the peroneal tendon  and muscle and then I exposed the plate and removed all the screws that were in place.  I then removed the plate.  I then turned my attention to the medial side which was able to remove through a mini  open incision the medial malleolus screws and percutaneously removed the posterior malleolus screw from anterior to posterior.  There was some purulence over the medial side which I used a tonsil to enter and debrided.  I sent the tissue from here as well as the lateral side for culture.  I then irrigated the wounds I did performed a stress examination under live fluoroscopic imaging which showed no medial clear space widening with external rotation stress view.  I placed a black granular foam sponge in the lateral wound and connected it to 125 mmHg.  The patient then had a dressing placed over his ankle after his medial incisions were closed with 3-0 nylon.  Patient was then awoke from anesthesia and taken to the PACU in stable condition.   Debridement type: Excisional Debridement  Side: right  Body Location: Ankle  Tools used for debridement: scalpel, curette, and rongeur  Pre-debridement Wound size (cm):   Length: 2        Width: 2     Depth: 0.5   Post-debridement Wound size (cm):   Length: 15        Width: 3.5     Depth: 1   Debridement depth beyond dead/damaged tissue down to healthy viable tissue: yes  Tissue layer involved: skin, subcutaneous tissue, muscle / fascia  Nature of tissue removed: Slough, Necrotic, and Devitalized Tissue  Irrigation volume: 1 L     Irrigation fluid type: Normal Saline   Post Op Plan/Instructions: The patient will be nonweightbearing to the right lower extremity.  Will return to the operating room for a angiography with vascular surgery tomorrow and repeat debridement with Gearldean Keepers on Wednesday.  Continue with broad-spectrum antibiotics per the primary team.  I was present and performed the entire surgery.  Delberta Fee, PA-C did assist me throughout the case. An assistant was necessary given the difficulty in approach, maintenance of reduction and ability to instrument the fracture.   Katheryne Pane, MD [facility]

## 2023-03-31 NOTE — Anesthesia Postprocedure Evaluation (Signed)
 Anesthesia Post Note  Patient: Harold [name]  Procedure(s) Performed: HARDWARE REMOVAL RIGHT ANKLE (Right: Ankle) APPLICATION OF WOUND VAC (Right: Ankle)     Patient location during evaluation: PACU Anesthesia Type: General Level of consciousness: awake and alert, oriented and patient cooperative Pain management: pain level controlled Vital Signs Assessment: post-procedure vital signs reviewed and stable Respiratory status: spontaneous breathing, nonlabored ventilation and respiratory function stable Cardiovascular status: blood pressure returned to baseline and stable Postop Assessment: no apparent nausea or vomiting Anesthetic complications: no   No notable events documented.  Last Vitals:  Vitals:   03/31/23 1225 03/31/23 1230  BP: (!) 109/47 (!) 110/44  Pulse: (!) 54 (!) 57  Resp: 15 18  Temp: 36.6 C   SpO2: 98% 99%    Last Pain:  Vitals:   03/31/23 1009  TempSrc:   PainSc: 0-No pain                 Jacquelyne Matte

## 2023-03-31 NOTE — Progress Notes (Signed)
 Patient has had a total of 7 BM this shift of loose, foul smelling, mucous-like stools.  No fever, abdominal pain or cramping.  Made Harold [name] aware for order for GI panel, unable to provide order without direction from Infectious Disease.  Will cont to monitor.

## 2023-03-31 NOTE — Progress Notes (Signed)
 OT Cancellation Note  Patient Details Name: Harold [name] MRN: [id number] DOB: 10-Sep-1955   Cancelled Treatment:    Reason Eval/Treat Not Completed: Patient at procedure or test/ unavailable (Patient going to OR for hardware removal. OT to attempt later today as schedule permits) Anitra Barn, OTA [facility]  Office [phone]  Harold [name] 03/31/2023, 9:50 AM

## 2023-03-31 NOTE — Progress Notes (Signed)
 PROGRESS NOTE  Harold [name]  DOB: 23-Feb-1955  PCP: Amin, Saad, MD MVH:[id number]  DOA: 03/27/2023  LOS: 4 days  Hospital Day: 5  Brief narrative: Harold [name] is a 68 y.o. male with PMH significant for morbid obesity, OSA, DM2, HTN, HLD, CAD/stent, A-fib on Eliquis , carotid artery stenosis, CKD, chronic anemia, anxiety/depression, diabetic neuropathy, GERD, hypothyroidism, COPD, pulmonary hypertension. 2/6, patient presented to the ED with complaint of cough, subjective fever, myalgia, generalized weakness for 5 days.   In the ED, patient was afebrile, blood pressure elevated to 170s to 190s Initial labs with CBC unremarkable, BMP with BUN/creatinine 22/2.73 Respiratory virus panel positive for influenza A. Chest x-ray negative for infiltrates. Right tibia-fibula x-ray showed soft tissue swelling most pronounced over the lateral malleolus. EDP discussed with orthopedics Admitted to [facility]  Subjective: Patient was seen and examined this morning.  Propped up in bed.  Not in distress.  Alert, awake, oriented x 3 Soon after he was taken to the OR for surgery. Since last night, patient has had multiple episodes of loose bowel movement. No fever. Labs from this morning showed improvement in renal function.  Assessment and plan: Right foot cellulitis In the setting of uncontrolled diabetes. In November 2024, he underwent ORIF of the right ankle, with ensuing revision in December 2024.  He has subsequently been followed by wound care at home.  However over the course the last 1 week, patient had progressive right lateral malleolus erythema, swelling, tenderness, with some evidence of purulent drainage, X-ray showed soft tissue swelling most pronounced over the lateral malleolus, consistent with cellulitis, without evidence of subcutaneous gas or crepitus to suggest necrotizing fasciitis.  Orthopedics and vascular surgery consult appreciated. In OR at this time for hardware  removal.  Tentative plan of angiogram on Tuesday Currently on broad-spectrum antibiotic coverage  Acute diarrhea Since last night. Colchicine  was stopped. GI pathogen panel ordered Unlikely to be C. difficile in the absence of fever, leukocytosis Continue to monitor diarrhea frequency and severity Recent Labs  Lab 03/27/23 1832 03/28/23 0447 03/29/23 0407 03/30/23 0501  WBC 7.4 7.8 6.4 7.7  PROCALCITON  --  0.31  --   --    Influenza A Presented with 5 days of cough, fever, myalgia Respiratory virus panel positive for influenza A Chest x-ray without acute infiltrate No fever, no WBC count Was outside the 48-hour window for Tamiflu and hence was not initiated Currently on supportive care with scheduled Mucinex , as needed Tessalon  Perles, incentive spirometry Respiratory status stable. Recent Labs  Lab 03/27/23 1832 03/28/23 0447 03/29/23 0407 03/30/23 0501  WBC 7.4 7.8 6.4 7.7  PROCALCITON  --  0.31  --   --    Type 2 diabetes mellitus  Diabetic neuropathy A1c 7 on 01/02/2023 PTA meds-Tresiba  60 units daily, Mounjaro  Currently on Lantus  30 units daily and SSI/Accu-Cheks.  Continue to monitor  PTA on gabapentin  1600 mg 3 times daily???.  Seems very high-dose.  Unable to confirm from the patient Currently on 1200 mg 3 times daily.  Given worsening renal function, gabapentin  to 400 mg 3 times daily. Recent Labs  Lab 03/30/23 1228 03/30/23 1600 03/31/23 0001 03/31/23 0610 03/31/23 0943  GLUCAP 208* 195* 161* 167* 166*   AKI on CKD 4 Baseline creatinine 2.13 from December 2024.  Creatinine gradually worsening.  Lasix  on hold. Continue to monitor for now. Follows up with [facility] as an outpatient Recent Labs    01/09/23 0622 01/10/23 0725 01/11/23 8413 01/12/23 2440 01/28/23 1027  03/27/23 1832 03/28/23 0447 03/29/23 0407 03/30/23 0501 03/31/23 0828  BUN 31* 35* 31* 32* 18 22 23  25* 27* 26*  CREATININE 2.18* 2.85* 2.48* 2.54* 2.13* 2.73* 2.96* 2.61*  2.89* 2.58*   Hypokalemia Potassium level improved with replacement. Recent Labs  Lab 03/27/23 1832 03/28/23 0447 03/29/23 0407 03/30/23 0501 03/31/23 0828  K 3.5 2.9* 2.9* 3.3* 3.5  MG 1.9 1.9  --   --   --    Paroxysmal A-fib PTA meds- Coreg , amiodarone  Currently continued on both Chronically anticoagulated with Eliquis .  Currently on hold in preparation of surgery Patient is on normal sinus rhythm and hence not started on any bridging while off Eliquis   Essential hypertension PTA meds- Coreg , Lasix  Continue Coreg . keep Lasix  on hold IV hydralazine  as needed  CAD/stent Carotid artery stenosis HLD PTA meds -Eliquis , Plavix , Crestor  Continue Crestor .  Eliquis  and Plavix  on hold for procedures  Hypothyroidism Continue Synthroid   Morbid Obesity  Body mass index is 41.56 kg/m. Patient has been advised to make an attempt to improve diet and exercise patterns to aid in weight loss.  Obstructive sleep apnea Patient reported good compliance on home nocturnal CPAP  Gout Allopurinol , colchicine   Depression Cymbalta    Mobility: Needs PT eval postprocedure  Goals of care   Code Status: Full Code     DVT prophylaxis:  SCDs Start: 03/27/23 2243   Antimicrobials: Broad-spectrum IV antibiotics Fluid: None Consultants: Orthopedics, vascular surgery Family Communication: None at bedside  Status: Inpatient Level of care:  Telemetry Medical   Patient is from: Home Needs to continue in-hospital care: Pending clinical course, pending procedure on Monday and Tuesday Anticipated d/c to: Pending clinical course    Diet:  Diet Order             Diet NPO time specified  Diet effective midnight           Diet NPO time specified  Diet effective midnight                   Scheduled Meds:  [MAR Hold] allopurinol   100 mg Oral Daily   [MAR Hold] amiodarone   200 mg Oral BID   [MAR Hold] carvedilol   3.125 mg Oral BID WC   [MAR Hold] DULoxetine   60 mg Oral  Daily   [MAR Hold] ezetimibe   10 mg Oral Daily   [MAR Hold] gabapentin   400 mg Oral TID   [MAR Hold] guaiFENesin   600 mg Oral BID   [MAR Hold] insulin  aspart  0-9 Units Subcutaneous Q6H   [MAR Hold] insulin  glargine-yfgn  30 Units Subcutaneous Daily   [MAR Hold] levothyroxine   88 mcg Oral QAC breakfast   [MAR Hold] metroNIDAZOLE   500 mg Oral Q12H   [MAR Hold] rosuvastatin   20 mg Oral QPM    PRN meds: [MAR Hold] acetaminophen  **OR** [MAR Hold] acetaminophen , [MAR Hold] benzonatate , [MAR Hold]  HYDROmorphone  (DILAUDID ) injection, insulin  aspart, [MAR Hold] melatonin, [MAR Hold] naLOXone  (NARCAN )  injection, [MAR Hold] ondansetron  (ZOFRAN ) IV   Infusions:   sodium chloride      [MAR Hold]  ceFAZolin  (ANCEF ) IV     [MAR Hold] ceFEPime  (MAXIPIME ) IV 2 g (03/30/23 2139)   [MAR Hold] vancomycin  2,000 mg (03/29/23 2222)    Antimicrobials: Anti-infectives (From admission, onward)    Start     Dose/Rate Route Frequency Ordered Stop   03/31/23 1015  [MAR Hold]  ceFAZolin  (ANCEF ) IVPB 2g/100 mL premix        (MAR Hold since Mon 03/31/2023 at  0957.Hold Reason: Transfer to a Procedural area)   2 g 200 mL/hr over 30 Minutes Intravenous On call to O.R. 03/31/23 0921 04/01/23 0559   03/29/23 2200  [MAR Hold]  vancomycin  (VANCOREADY) IVPB 2000 mg/400 mL        (MAR Hold since Mon 03/31/2023 at 0957.Hold Reason: Transfer to a Procedural area)  Placed in "Followed by" Linked Group   2,000 mg 200 mL/hr over 120 Minutes Intravenous Every 48 hours 03/27/23 2257     03/28/23 1045  [MAR Hold]  metroNIDAZOLE  (FLAGYL ) tablet 500 mg        (MAR Hold since Mon 03/31/2023 at 0957.Hold Reason: Transfer to a Procedural area)   500 mg Oral Every 12 hours 03/28/23 0955     03/27/23 2330  vancomycin  (VANCOCIN ) 2,500 mg in sodium chloride  0.9 % 500 mL IVPB       Placed in "Followed by" Linked Group   2,500 mg 262.5 mL/hr over 120 Minutes Intravenous  Once 03/27/23 2257 03/28/23 0241   03/27/23 2300  metroNIDAZOLE   (FLAGYL ) IVPB 500 mg  Status:  Discontinued        500 mg 100 mL/hr over 60 Minutes Intravenous Every 12 hours 03/27/23 2247 03/28/23 0955   03/27/23 2300  [MAR Hold]  ceFEPIme  (MAXIPIME ) 2 g in sodium chloride  0.9 % 100 mL IVPB        (MAR Hold since Mon 03/31/2023 at 0957.Hold Reason: Transfer to a Procedural area)   2 g 200 mL/hr over 30 Minutes Intravenous Every 12 hours 03/27/23 2257     03/27/23 2100  ceFAZolin  (ANCEF ) IVPB 2g/100 mL premix        2 g 200 mL/hr over 30 Minutes Intravenous  Once 03/27/23 2058 03/27/23 2219       Objective: Vitals:   03/31/23 0753 03/31/23 1002  BP: (!) 152/69 (!) 161/56  Pulse: (!) 56 60  Resp: 19 18  Temp: 97.8 F (36.6 C) 98.2 F (36.8 C)  SpO2: 98% 98%    Intake/Output Summary (Last 24 hours) at 03/31/2023 1106 Last data filed at 03/31/2023 0900 Gross per 24 hour  Intake 0 ml  Output 400 ml  Net -400 ml   Filed Weights   03/30/23 0545 03/31/23 0500 03/31/23 1002  Weight: (!) 138.1 kg (!) 136.8 kg (!) 142.9 kg   Weight change: -1.3 kg Body mass index is 41.56 kg/m.   Physical Exam: General exam: Pleasant, elderly Caucasian male Skin: No rashes, lesions or ulcers. HEENT: Atraumatic, normocephalic, no obvious bleeding Lungs: Clear to auscultation bilaterally, on CPAP CVS: S1, S2, no murmur,   GI/Abd: Soft, nontender, nondistended, bowel sound present,   CNS: Alert, awake, oriented to place and person. Psychiatry: Mood appropriate,  Extremities: Trace bilateral pedal edema, no calf tenderness, right foot has bandage on  Data Review: I have personally reviewed the laboratory data and studies available.  F/u labs ordered Unresulted Labs (From admission, onward)     Start     Ordered   04/01/23 0500  Basic metabolic panel  Daily,   R      03/31/23 0813   04/01/23 0500  CBC with Differential/Platelet  Daily,   R      03/31/23 0813   03/31/23 1015  Surgical pcr screen  ONCE - URGENT,   URGENT        03/31/23 1014   03/31/23  0815  Gastrointestinal Panel by PCR , Stool  (Gastrointestinal Panel by PCR, Stool                                                                                                                                                     **  Does Not include CLOSTRIDIUM DIFFICILE testing. **If CDIFF testing is needed, place order from the "C Difficile Testing" order set.**)  Once,   R        03/31/23 0814           Total time spent in review of labs and imaging, patient evaluation, formulation of plan, documentation and communication with family: 45 minutes  Signed, Hoyt Macleod, MD [facility] 03/31/2023

## 2023-03-31 NOTE — Transfer of Care (Signed)
 Immediate Anesthesia Transfer of Care Note  Patient: Harold [name]  Procedure(s) Performed: HARDWARE REMOVAL RIGHT ANKLE (Right: Ankle) possible APPLICATION OF WOUND VAC (Right: Ankle)  Patient Location: PACU  Anesthesia Type:General  Level of Consciousness: awake, drowsy, and patient cooperative  Airway & Oxygen Therapy: Patient Spontanous Breathing and Patient connected to nasal cannula oxygen  Post-op Assessment: Report given to RN and Post -op Vital signs reviewed and stable  Post vital signs: Reviewed and stable  Last Vitals:  Vitals Value Taken Time  BP 109/47 03/31/23 1225  Temp    Pulse 56 03/31/23 1227  Resp 15 03/31/23 1227  SpO2 98 % 03/31/23 1227  Vitals shown include unfiled device data.  Last Pain:  Vitals:   03/31/23 1009  TempSrc:   PainSc: 0-No pain         Complications: No notable events documented.

## 2023-03-31 NOTE — Progress Notes (Signed)
 Ortho Trauma Note  I have reviewed the patient's chart and I discussed his case with Dr. Guyann Leitz.  Patient will require removal of hardware with possible wound VAC placement prior to his angiography and possible stenting with vascular tomorrow.  Depending on results of today he will likely go back to the operating room later in the week with Dr. Julio Ohm.  I discussed this with the patient.  He is agreeable to proceed.  Consent was obtained.  Laneta Pintos, MD [facility] [phone] (office) [email or web address]

## 2023-04-01 ENCOUNTER — Encounter (HOSPITAL_COMMUNITY)
Admission: EM | Disposition: A | Discharge status: Home Health | Payer: Self-pay | Source: Home / Self Care | Attending: Family Medicine

## 2023-04-01 ENCOUNTER — Encounter (HOSPITAL_COMMUNITY): Payer: Self-pay | Admitting: Student

## 2023-04-01 DIAGNOSIS — E1165 Type 2 diabetes mellitus with hyperglycemia: Secondary | ICD-10-CM | POA: Diagnosis not present

## 2023-04-01 DIAGNOSIS — T847XXA Infection and inflammatory reaction due to other internal orthopedic prosthetic devices, implants and grafts, initial encounter: Secondary | ICD-10-CM

## 2023-04-01 DIAGNOSIS — J449 Chronic obstructive pulmonary disease, unspecified: Secondary | ICD-10-CM | POA: Diagnosis not present

## 2023-04-01 DIAGNOSIS — J101 Influenza due to other identified influenza virus with other respiratory manifestations: Secondary | ICD-10-CM | POA: Diagnosis not present

## 2023-04-01 DIAGNOSIS — L02611 Cutaneous abscess of right foot: Secondary | ICD-10-CM

## 2023-04-01 DIAGNOSIS — S81801A Unspecified open wound, right lower leg, initial encounter: Secondary | ICD-10-CM

## 2023-04-01 DIAGNOSIS — I2510 Atherosclerotic heart disease of native coronary artery without angina pectoris: Secondary | ICD-10-CM | POA: Diagnosis not present

## 2023-04-01 DIAGNOSIS — D649 Anemia, unspecified: Secondary | ICD-10-CM | POA: Diagnosis not present

## 2023-04-01 DIAGNOSIS — I5032 Chronic diastolic (congestive) heart failure: Secondary | ICD-10-CM | POA: Diagnosis not present

## 2023-04-01 DIAGNOSIS — E039 Hypothyroidism, unspecified: Secondary | ICD-10-CM | POA: Diagnosis not present

## 2023-04-01 DIAGNOSIS — I110 Hypertensive heart disease with heart failure: Secondary | ICD-10-CM | POA: Diagnosis not present

## 2023-04-01 DIAGNOSIS — I70213 Atherosclerosis of native arteries of extremities with intermittent claudication, bilateral legs: Secondary | ICD-10-CM | POA: Diagnosis not present

## 2023-04-01 DIAGNOSIS — E1140 Type 2 diabetes mellitus with diabetic neuropathy, unspecified: Secondary | ICD-10-CM | POA: Diagnosis not present

## 2023-04-01 DIAGNOSIS — I739 Peripheral vascular disease, unspecified: Secondary | ICD-10-CM | POA: Diagnosis not present

## 2023-04-01 HISTORY — PX: ABDOMINAL AORTOGRAM W/LOWER EXTREMITY: CATH118223

## 2023-04-01 HISTORY — PX: PERIPHERAL VASCULAR INTERVENTION: CATH118257

## 2023-04-01 HISTORY — PX: PERIPHERAL VASCULAR BALLOON ANGIOPLASTY: CATH118281

## 2023-04-01 LAB — GLUCOSE, CAPILLARY
Glucose-Capillary: 102 mg/dL — ABNORMAL HIGH (ref 70–99)
Glucose-Capillary: 116 mg/dL — ABNORMAL HIGH (ref 70–99)
Glucose-Capillary: 113 mg/dL — ABNORMAL HIGH (ref 70–99)

## 2023-04-01 LAB — BASIC METABOLIC PANEL
Anion gap: 12 (ref 5–15)
BUN: 22 mg/dL (ref 8–23)
CO2: 20 mmol/L — ABNORMAL LOW (ref 22–32)
Calcium: 8.8 mg/dL — ABNORMAL LOW (ref 8.9–10.3)
Chloride: 107 mmol/L (ref 98–111)
Creatinine, Ser: 2.28 mg/dL — ABNORMAL HIGH (ref 0.61–1.24)
GFR, Estimated: 30 mL/min — ABNORMAL LOW (ref >60)
Glucose, Bld: 114 mg/dL — ABNORMAL HIGH (ref 70–99)
Potassium: 3.6 mmol/L (ref 3.5–5.1)
Sodium: 139 mmol/L (ref 135–145)

## 2023-04-01 LAB — VITAMIN D 25 HYDROXY (VIT D DEFICIENCY, FRACTURES): Vit D, 25-Hydroxy: 28.06 ng/mL — ABNORMAL LOW (ref 30–100)

## 2023-04-01 LAB — CBC WITH DIFFERENTIAL/PLATELET
Abs Immature Granulocytes: 0.07 10*3/uL (ref 0.00–0.07)
Basophils Absolute: 0.1 10*3/uL (ref 0.0–0.1)
Basophils Relative: 1 %
Eosinophils Absolute: 0.4 10*3/uL (ref 0.0–0.5)
Eosinophils Relative: 4 %
HCT: 33.3 % — ABNORMAL LOW (ref 39.0–52.0)
Hemoglobin: 11.5 g/dL — ABNORMAL LOW (ref 13.0–17.0)
Immature Granulocytes: 1 %
Lymphocytes Relative: 14 %
Lymphs Abs: 1.2 10*3/uL (ref 0.7–4.0)
MCH: 29.6 pg (ref 26.0–34.0)
MCHC: 34.5 g/dL (ref 30.0–36.0)
MCV: 85.6 fL (ref 80.0–100.0)
Monocytes Absolute: 0.7 10*3/uL (ref 0.1–1.0)
Monocytes Relative: 9 %
Neutro Abs: 6 10*3/uL (ref 1.7–7.7)
Neutrophils Relative %: 71 %
Platelets: 211 10*3/uL (ref 150–400)
RBC: 3.89 MIL/uL — ABNORMAL LOW (ref 4.22–5.81)
RDW: 13.7 % (ref 11.5–15.5)
WBC: 8.4 10*3/uL (ref 4.0–10.5)
nRBC: 0 % (ref 0.0–0.2)

## 2023-04-01 SURGERY — ABDOMINAL AORTOGRAM W/LOWER EXTREMITY
Anesthesia: LOCAL | Laterality: Right

## 2023-04-01 MED ORDER — HYDRALAZINE HCL 20 MG/ML IJ SOLN
5.0000 mg | INTRAMUSCULAR | Status: AC | PRN
  Administered 2023-04-02 (×2): 5 mg via INTRAVENOUS
  Filled 2023-04-01 (×2): qty 1

## 2023-04-01 MED ORDER — LIDOCAINE HCL (PF) 1 % IJ SOLN
INTRAMUSCULAR | Status: DC | PRN
  Administered 2023-04-01: 15 mL via INTRADERMAL

## 2023-04-01 MED ORDER — HEPARIN SODIUM (PORCINE) 1000 UNIT/ML IJ SOLN
INTRAMUSCULAR | Status: AC
  Filled 2023-04-01: qty 10

## 2023-04-01 MED ORDER — CLOPIDOGREL BISULFATE 75 MG PO TABS
75.0000 mg | ORAL_TABLET | Freq: Every day | ORAL | Status: DC
  Administered 2023-04-03 – 2023-05-10 (×38): 75 mg via ORAL
  Filled 2023-04-01 (×41): qty 1

## 2023-04-01 MED ORDER — FENTANYL CITRATE (PF) 100 MCG/2ML IJ SOLN
INTRAMUSCULAR | Status: DC | PRN
  Administered 2023-04-01: 50 ug via INTRAVENOUS
  Administered 2023-04-01: 25 ug via INTRAVENOUS

## 2023-04-01 MED ORDER — MIDAZOLAM HCL 2 MG/2ML IJ SOLN
INTRAMUSCULAR | Status: AC
  Filled 2023-04-01: qty 2

## 2023-04-01 MED ORDER — CEFAZOLIN SODIUM-DEXTROSE 3-4 GM/150ML-% IV SOLN
3.0000 g | INTRAVENOUS | Status: DC
  Filled 2023-04-01: qty 150

## 2023-04-01 MED ORDER — SODIUM CHLORIDE 0.9% FLUSH
3.0000 mL | INTRAVENOUS | Status: DC | PRN
  Administered 2023-04-05 – 2023-05-01 (×2): 3 mL via INTRAVENOUS

## 2023-04-01 MED ORDER — SODIUM CHLORIDE 0.9% FLUSH
3.0000 mL | Freq: Two times a day (BID) | INTRAVENOUS | Status: DC
  Administered 2023-04-02 – 2023-05-10 (×64): 3 mL via INTRAVENOUS

## 2023-04-01 MED ORDER — MIDAZOLAM HCL 2 MG/2ML IJ SOLN
INTRAMUSCULAR | Status: DC | PRN
  Administered 2023-04-01: 2 mg via INTRAVENOUS
  Administered 2023-04-01: 1 mg via INTRAVENOUS

## 2023-04-01 MED ORDER — LIDOCAINE HCL (PF) 1 % IJ SOLN
INTRAMUSCULAR | Status: AC
  Filled 2023-04-01: qty 30

## 2023-04-01 MED ORDER — SODIUM CHLORIDE 0.9 % WEIGHT BASED INFUSION
1.0000 mL/kg/h | INTRAVENOUS | Status: AC
  Administered 2023-04-01 (×2): 1 mL/kg/h via INTRAVENOUS

## 2023-04-01 MED ORDER — FENTANYL CITRATE (PF) 100 MCG/2ML IJ SOLN
INTRAMUSCULAR | Status: AC
  Filled 2023-04-01: qty 2

## 2023-04-01 MED ORDER — HEPARIN SODIUM (PORCINE) 1000 UNIT/ML IJ SOLN
INTRAMUSCULAR | Status: DC | PRN
  Administered 2023-04-01: 12000 [IU] via INTRAVENOUS

## 2023-04-01 MED ORDER — SODIUM CHLORIDE 0.9 % IV SOLN: 250.0000 mL | INTRAVENOUS | Status: AC | PRN

## 2023-04-01 MED ORDER — ASPIRIN 81 MG PO TBEC
81.0000 mg | DELAYED_RELEASE_TABLET | Freq: Every day | ORAL | Status: DC
  Administered 2023-04-01 – 2023-04-04 (×4): 81 mg via ORAL
  Filled 2023-04-01 (×4): qty 1

## 2023-04-01 MED ORDER — HEPARIN (PORCINE) IN NACL 1000-0.9 UT/500ML-% IV SOLN
INTRAVENOUS | Status: DC | PRN
  Administered 2023-04-01 (×2): 500 mL

## 2023-04-01 MED ORDER — POVIDONE-IODINE 10 % EX SWAB
2.0000 | Freq: Once | CUTANEOUS | Status: AC
  Administered 2023-04-02: 2 via TOPICAL

## 2023-04-01 MED ORDER — IODIXANOL 320 MG/ML IV SOLN
INTRAVENOUS | Status: DC | PRN
  Administered 2023-04-01: 25 mL

## 2023-04-01 MED ORDER — CHLORHEXIDINE GLUCONATE 4 % EX SOLN
60.0000 mL | Freq: Once | CUTANEOUS | Status: AC
Start: 2023-04-01 — End: 2023-04-02
  Administered 2023-04-02: 4 via TOPICAL
  Filled 2023-04-01: qty 60

## 2023-04-01 SURGICAL SUPPLY — 20 items
BALLN MUSTANG 6X120X135 (BALLOONS) ×1 IMPLANT
BALLOON MUSTANG 6X120X135 (BALLOONS) IMPLANT
CATH OMNI FLUSH 5F 65CM (CATHETERS) IMPLANT
CLOSURE MYNX CONTROL 6F/7F (Vascular Products) IMPLANT
DEVICE VASC CLSR CELT ART 6 (Vascular Products) IMPLANT
KIT ANGIASSIST CO2 SYSTEM (KITS) IMPLANT
KIT ENCORE 26 ADVANTAGE (KITS) IMPLANT
KIT MICROPUNCTURE NIT STIFF (SHEATH) IMPLANT
KIT PV (KITS) IMPLANT
SET ATX-X65L (MISCELLANEOUS) IMPLANT
SHEATH CATAPULT 6FR 45 (SHEATH) IMPLANT
SHEATH PINNACLE 5F 10CM (SHEATH) IMPLANT
SHEATH PINNACLE 6F 10CM (SHEATH) IMPLANT
SHEATH PROBE COVER 6X72 (BAG) IMPLANT
STENT ELUVIA 7X150X130 (Permanent Stent) IMPLANT
TAPE SHOOT N SEE (TAPE) IMPLANT
TRAY PV CATH (CUSTOM PROCEDURE TRAY) ×1 IMPLANT
WIRE BENTSON .035X145CM (WIRE) IMPLANT
WIRE HI TORQ VERSACORE 300 (WIRE) IMPLANT
WIRE ROSEN-J .035X260CM (WIRE) IMPLANT

## 2023-04-01 NOTE — Progress Notes (Signed)
  Progress Note    04/01/2023 8:17 AM 1 Day Post-Op  Subjective:  no complaints   Vitals:   04/01/23 0424 04/01/23 0741  BP: (!) 161/63 (!) 160/63  Pulse: 63 60  Resp: 18 18  Temp: 97.8 F (36.6 C) 97.7 F (36.5 C)  SpO2: 96% 100%   Physical Exam: Lungs:  non labored Extremities:  R ankle dressing left in place Neurologic: A&O  CBC    Component Value Date/Time   WBC 8.4 04/01/2023 0529   RBC 3.89 (L) 04/01/2023 0529   HGB 11.5 (L) 04/01/2023 0529   HGB 14.6 11/11/2022 1522   HCT 33.3 (L) 04/01/2023 0529   HCT 43.8 11/11/2022 1522   PLT 211 04/01/2023 0529   PLT 169 10/29/2022 1203   MCV 85.6 04/01/2023 0529   MCV 96 10/29/2022 1203   MCH 29.6 04/01/2023 0529   MCHC 34.5 04/01/2023 0529   RDW 13.7 04/01/2023 0529   RDW 14.5 10/29/2022 1203   LYMPHSABS 1.2 04/01/2023 0529   LYMPHSABS 1.6 05/12/2018 1150   MONOABS 0.7 04/01/2023 0529   EOSABS 0.4 04/01/2023 0529   EOSABS 0.2 05/12/2018 1150   BASOSABS 0.1 04/01/2023 0529   BASOSABS 0.1 05/12/2018 1150    BMET    Component Value Date/Time   NA 139 04/01/2023 0529   NA 141 11/14/2022 1624   K 3.6 04/01/2023 0529   CL 107 04/01/2023 0529   CO2 20 (L) 04/01/2023 0529   GLUCOSE 114 (H) 04/01/2023 0529   BUN 22 04/01/2023 0529   BUN 23 11/14/2022 1624   CREATININE 2.28 (H) 04/01/2023 0529   CREATININE 1.51 (H) 10/16/2015 1053   CALCIUM 8.8 (L) 04/01/2023 0529   GFRNONAA 30 (L) 04/01/2023 0529   GFRNONAA 49 (L) 10/16/2015 1053   GFRAA 47 (L) 09/28/2019 1242   GFRAA 56 (L) 10/16/2015 1053    INR    Component Value Date/Time   INR 1.3 (H) 03/28/2023 0447     Intake/Output Summary (Last 24 hours) at 04/01/2023 0817 Last data filed at 03/31/2023 1700 Gross per 24 hour  Intake 840 ml  Output 700 ml  Net 140 ml     Assessment/Plan:  68 y.o. male with R ankle wound  Scheduled for Aortogram with RLE runoff and possible intervention tomorrow with Dr. Myra Gianotti.   Case was discussed and all questions  were answered.   CKD stage IV currently.  We will perform angiogram with mostly CO2 however he is aware of risk of AKI and need for HD.   Victorino Sparrow MD [facility] [phone] 04/01/2023 8:17 AM

## 2023-04-01 NOTE — Progress Notes (Signed)
Patient ID: Harold [name], male   DOB: [birth year], 69 y.o.   MRN: [id number] Patient underwent hardware removal yesterday plan for endovascular evaluation today.  I will plan on repeat debridement Wednesday of the right ankle.

## 2023-04-01 NOTE — Plan of Care (Signed)
  Problem: Education: Goal: Knowledge of General Education information will improve Description: Including pain rating scale, medication(s)/side effects and non-pharmacologic comfort measures Outcome: Progressing   Problem: Health Behavior/Discharge Planning: Goal: Ability to manage health-related needs will improve Outcome: Progressing   Problem: Clinical Measurements: Goal: Ability to maintain clinical measurements within normal limits will improve Outcome: Progressing Goal: Will remain free from infection Outcome: Progressing Goal: Diagnostic test results will improve Outcome: Progressing Goal: Respiratory complications will improve Outcome: Progressing Goal: Cardiovascular complication will be avoided Outcome: Progressing   Problem: Activity: Goal: Risk for activity intolerance will decrease Outcome: Progressing   Problem: Nutrition: Goal: Adequate nutrition will be maintained Outcome: Progressing   Problem: Coping: Goal: Level of anxiety will decrease Outcome: Progressing   Problem: Elimination: Goal: Will not experience complications related to bowel motility Outcome: Progressing Goal: Will not experience complications related to urinary retention Outcome: Progressing   Problem: Pain Managment: Goal: General experience of comfort will improve and/or be controlled Outcome: Progressing   Problem: Safety: Goal: Ability to remain free from injury will improve Outcome: Progressing   Problem: Skin Integrity: Goal: Risk for impaired skin integrity will decrease Outcome: Progressing   Problem: Education: Goal: Ability to describe self-care measures that may prevent or decrease complications (Diabetes Survival Skills Education) will improve Outcome: Progressing Goal: Individualized Educational Video(s) Outcome: Progressing   Problem: Coping: Goal: Ability to adjust to condition or change in health will improve Outcome: Progressing   Problem: Fluid  Volume: Goal: Ability to maintain a balanced intake and output will improve Outcome: Progressing   Problem: Health Behavior/Discharge Planning: Goal: Ability to identify and utilize available resources and services will improve Outcome: Progressing Goal: Ability to manage health-related needs will improve Outcome: Progressing   Problem: Metabolic: Goal: Ability to maintain appropriate glucose levels will improve Outcome: Progressing   Problem: Nutritional: Goal: Maintenance of adequate nutrition will improve Outcome: Progressing Goal: Progress toward achieving an optimal weight will improve Outcome: Progressing   Problem: Skin Integrity: Goal: Risk for impaired skin integrity will decrease Outcome: Progressing   Problem: Tissue Perfusion: Goal: Adequacy of tissue perfusion will improve Outcome: Progressing   Problem: Education: Goal: Understanding of CV disease, CV risk reduction, and recovery process will improve Outcome: Progressing Goal: Individualized Educational Video(s) Outcome: Progressing   Problem: Activity: Goal: Ability to return to baseline activity level will improve Outcome: Progressing   Problem: Cardiovascular: Goal: Ability to achieve and maintain adequate cardiovascular perfusion will improve Outcome: Progressing Goal: Vascular access site(s) Level 0-1 will be maintained Outcome: Progressing   Problem: Health Behavior/Discharge Planning: Goal: Ability to safely manage health-related needs after discharge will improve Outcome: Progressing

## 2023-04-01 NOTE — Progress Notes (Signed)
Pt placed on CPAP to rest for the evening.

## 2023-04-01 NOTE — Progress Notes (Signed)
  Progress Note    04/01/2023 7:34 AM 1 Day Post-Op  Patient with Non healing Right ankle wound Scheduled for CO2 Aortogram, arteriogram RLE today with Dr. Myra Gianotti Reviewed procedure with patient and answered his questions Keep NPO Consent ordered    Graceann Congress, PA-C [facility] [phone] 04/01/2023 7:34 AM

## 2023-04-01 NOTE — Progress Notes (Addendum)
Orthopaedic Trauma Progress Note  SUBJECTIVE: Doing ok today.  Reports pain in right lower extremity which he expected.  No chest pain. No SOB. No nausea/vomiting. No other complaints.  Working with therapies currently.  Scheduled for endovascular procedure later today.  OBJECTIVE:  Vitals:   04/01/23 0424 04/01/23 0741  BP: (!) 161/63 (!) 160/63  Pulse: 63 60  Resp: 18 18  Temp: 97.8 F (36.6 C) 97.7 F (36.5 C)  SpO2: 96% 100%    General: Sitting up on edge of bed, no acute distress.  Pleasant and cooperative Respiratory: No increased work of breathing.  RLE: Night splint in place.  Wound VAC with good seal and function.  About 75 mL of bloody drainage in canister.  IMAGING: All hardware removed on intra-op imaging  LABS:  Results for orders placed or performed during the hospital encounter of 03/27/23 (from the past 24 hours)  Basic metabolic panel     Status: Abnormal   Collection Time: 03/31/23  8:28 AM  Result Value Ref Range   Sodium 138 135 - 145 mmol/L   Potassium 3.5 3.5 - 5.1 mmol/L   Chloride 107 98 - 111 mmol/L   CO2 22 22 - 32 mmol/L   Glucose, Bld 169 (H) 70 - 99 mg/dL   BUN 26 (H) 8 - 23 mg/dL   Creatinine, Ser 8.29 (H) 0.61 - 1.24 mg/dL   Calcium 8.7 (L) 8.9 - 10.3 mg/dL   GFR, Estimated 26 (L) >60 mL/min   Anion gap 9 5 - 15  Glucose, capillary     Status: Abnormal   Collection Time: 03/31/23  9:43 AM  Result Value Ref Range   Glucose-Capillary 166 (H) 70 - 99 mg/dL  Surgical pcr screen     Status: None   Collection Time: 03/31/23 10:15 AM   Specimen: Nasal Mucosa; Nasal Swab  Result Value Ref Range   MRSA, PCR NEGATIVE NEGATIVE   Staphylococcus aureus NEGATIVE NEGATIVE  Aerobic/Anaerobic Culture w Gram Stain (surgical/deep wound)     Status: None (Preliminary result)   Collection Time: 03/31/23 11:54 AM   Specimen: Path fluid; Tissue  Result Value Ref Range   Specimen Description WOUND RIGHT ANKLE    Special Requests NONE    Gram Stain       RARE WBC PRESENT, PREDOMINANTLY PMN NO ORGANISMS SEEN Performed at [facility], [address], [place], [place] [address]    Culture PENDING    Report Status PENDING   Glucose, capillary     Status: Abnormal   Collection Time: 03/31/23 12:28 PM  Result Value Ref Range   Glucose-Capillary 151 (H) 70 - 99 mg/dL  Glucose, capillary     Status: Abnormal   Collection Time: 03/31/23  6:31 PM  Result Value Ref Range   Glucose-Capillary 123 (H) 70 - 99 mg/dL  Glucose, capillary     Status: Abnormal   Collection Time: 03/31/23 11:43 PM  Result Value Ref Range   Glucose-Capillary 102 (H) 70 - 99 mg/dL  Basic metabolic panel     Status: Abnormal   Collection Time: 04/01/23  5:29 AM  Result Value Ref Range   Sodium 139 135 - 145 mmol/L   Potassium 3.6 3.5 - 5.1 mmol/L   Chloride 107 98 - 111 mmol/L   CO2 20 (L) 22 - 32 mmol/L   Glucose, Bld 114 (H) 70 - 99 mg/dL   BUN 22 8 - 23 mg/dL   Creatinine, Ser 0.86 (H) 0.61 - 1.24  mg/dL   Calcium 8.8 (L) 8.9 - 10.3 mg/dL   GFR, Estimated 30 (L) >60 mL/min   Anion gap 12 5 - 15  CBC with Differential/Platelet     Status: Abnormal   Collection Time: 04/01/23  5:29 AM  Result Value Ref Range   WBC 8.4 4.0 - 10.5 K/uL   RBC 3.89 (L) 4.22 - 5.81 MIL/uL   Hemoglobin 11.5 (L) 13.0 - 17.0 g/dL   HCT 40.9 (L) 81.1 - 91.4 %   MCV 85.6 80.0 - 100.0 fL   MCH 29.6 26.0 - 34.0 pg   MCHC 34.5 30.0 - 36.0 g/dL   RDW 78.2 95.6 - 21.3 %   Platelets 211 150 - 400 K/uL   nRBC 0.0 0.0 - 0.2 %   Neutrophils Relative % 71 %   Neutro Abs 6.0 1.7 - 7.7 K/uL   Lymphocytes Relative 14 %   Lymphs Abs 1.2 0.7 - 4.0 K/uL   Monocytes Relative 9 %   Monocytes Absolute 0.7 0.1 - 1.0 K/uL   Eosinophils Relative 4 %   Eosinophils Absolute 0.4 0.0 - 0.5 K/uL   Basophils Relative 1 %   Basophils Absolute 0.1 0.0 - 0.1 K/uL   Immature Granulocytes 1 %   Abs Immature Granulocytes 0.07 0.00 - 0.07 K/uL  Glucose, capillary     Status: Abnormal   Collection Time:  04/01/23  5:53 AM  Result Value Ref Range   Glucose-Capillary 102 (H) 70 - 99 mg/dL   *Note: Due to a large number of results and/or encounters for the requested time period, some results have not been displayed. A complete set of results can be found in Results Review.    ASSESSMENT: Harold [name] is a 68 y.o. male, 1 Day Post-Op s/p HARDWARE REMOVAL RIGHT ANKLE APPLICATION OF WOUND VAC  CV/Blood loss: Acute blood loss anemia, Hgb 11.5 this AM. Stable from pre-op. Hemodynamically stable  PLAN: Weightbearing: NWB RLE ROM:  Unrestricted knee motion. Gentle ankle motion as tolerated Incisional and dressing care:  Maintain dressing Showering:  hold off on showering Orthopedic device(s):  Wound vac and night splint RLE Pain management: continue multimodal regimen VTE prophylaxis:  Eliquis on hold.   SCDs ID: Continue broad spectrum abx Foley/Lines:  No foley, KVO IVFs Impediments to Fracture Healing: Infection. Vit D level pending.  Dispo: Endvascular eval today. Plan to return to OR with Dr. Lajoyce Corners Wednesday 04/02/23 for repeat debridement right ankle keep NPO at midnight   Follow - up plan:  TBD by Dr. Carloyn Manner information:  Truitt Merle MD, Thyra Breed PA-C. After hours and holidays please check [email or web address] for group call information for [facility]   Thompson Caul, PA-C [phone] (office) [email or web address]

## 2023-04-01 NOTE — H&P (View-Only) (Signed)
Patient ID: Harold [name], male   DOB: [birth year], 69 y.o.   MRN: [id number] Patient underwent hardware removal yesterday plan for endovascular evaluation today.  I will plan on repeat debridement Wednesday of the right ankle.

## 2023-04-01 NOTE — Progress Notes (Signed)
PROGRESS NOTE  Harold [name]  DOB: December 16, 1955  PCP: [name], MD BJY:[id number]  DOA: 03/27/2023  LOS: 5 days  Hospital Day: 6  Brief narrative: Harold [name] is a 68 y.o. male with PMH significant for morbid obesity, OSA, DM2, HTN, HLD, CAD/stent, A-fib on Eliquis, carotid artery stenosis, CKD, chronic anemia, anxiety/depression, diabetic neuropathy, GERD, hypothyroidism, COPD, pulmonary hypertension. 2/6, patient presented to the ED with complaint of cough, subjective fever, myalgia, generalized weakness for 5 days. Respiratory virus panel positive for influenza A. Chest x-ray negative for infiltrates. Out of for Tamiflu.  He improved from fluid with supportive care.  On admission, he was also noted to have right leg cellulitis. Per chart review, in November 2024, he underwent ORIF of the right ankle, with ensuing revision in December 2024.  He has subsequently been followed by wound care at home.  However over the course the last 1 week, patient had progressive right lateral malleolus erythema, swelling, tenderness, with some evidence of purulent drainage, X-ray showed soft tissue swelling most pronounced over the lateral malleolus, consistent with cellulitis, without evidence of subcutaneous gas or crepitus to suggest necrotizing fasciitis.  Orthopedics and vascular surgery consult appreciated. Procedures as below.  Subjective: Patient was seen and examined this morning.  Sitting up in recliner.  Not in distress. Has wound VAC attached to right leg postsurgical site Pending right leg angiogram today No diarrhea after 8 loose bowel movements the other night.  Assessment and plan: Right leg wound infection Prior right ankle surgery (12/2022) complicated by postsurgical wound infection. S/p right ankle hardware removal, I&D and wound VAC placement -2/10 Dr. Jena Gauss In the setting of uncontrolled diabetes, and possible PAD. Pending angiogram today by vascular  surgery Currently on broad-spectrum antibiotic coverage  Acute diarrhea Unclear trigger.  Seems to have stopped.  No diarrhea after 8 loose bowel movements the other night  Influenza A Presented with 5 days of cough, fever, myalgia Respiratory virus panel positive for influenza A Chest x-ray without acute infiltrate No fever, no WBC count Was outside the 48-hour window for Tamiflu and hence was not initiated Currently on supportive care with scheduled Mucinex, as needed Tessalon Perles, incentive spirometry Respiratory status stable. Recent Labs  Lab 03/27/23 1832 03/28/23 0447 03/29/23 0407 03/30/23 0501 04/01/23 0529  WBC 7.4 7.8 6.4 7.7 8.4  PROCALCITON  --  0.31  --   --   --    Type 2 diabetes mellitus  Diabetic neuropathy A1c 7 on 01/02/2023 PTA meds-Tresiba 60 units daily, Mounjaro Currently on Lantus 30 units daily and SSI/Accu-Cheks.  Blood sugar level downtrending.  Remains n.p.o. this morning.  Okay to hold Lantus this morning.  Continue to monitor blood sugar trend. PTA on gabapentin 1600 mg 3 times daily???.  Seems very high-dose.  Unable to confirm from the patient Currently on reduced dose of gabapentin at 400 mg 3 times daily. Recent Labs  Lab 03/31/23 0943 03/31/23 1228 03/31/23 1831 03/31/23 2343 04/01/23 0553  GLUCAP 166* 151* 123* 102* 102*   AKI on CKD 4 Baseline creatinine 2.13 from December 2024.  Creatinine worsened to peak at 2.89 on 2/9, gradually improving now.  Lasix on hold. Continue to monitor for now. Follows up with [facility] as an outpatient Recent Labs    01/10/23 0725 01/11/23 0528 01/12/23 0609 01/28/23 0906 03/27/23 1832 03/28/23 0447 03/29/23 0407 03/30/23 0501 03/31/23 0828 04/01/23 0529  BUN 35* 31* 32* 18 22 23  25* 27* 26* 22  CREATININE 2.85* 2.48*  2.54* 2.13* 2.73* 2.96* 2.61* 2.89* 2.58* 2.28*   Paroxysmal A-fib PTA meds- Coreg, amiodarone Currently continued on both Chronically anticoagulated with  Eliquis.  Currently on hold in preparation of surgery Patient is on normal sinus rhythm and hence not started on any bridging while off Eliquis  Essential hypertension PTA meds- Coreg, Lasix Continue Coreg. keep Lasix on hold IV hydralazine as needed  CAD/stent Carotid artery stenosis HLD PTA meds -Eliquis, Plavix, Crestor Continue Crestor.  Eliquis and Plavix on hold for procedures  Hypothyroidism Continue Synthroid  Morbid Obesity  Body mass index is 41.56 kg/m. Patient has been advised to make an attempt to improve diet and exercise patterns to aid in weight loss.  Obstructive sleep apnea Patient reported good compliance on home nocturnal CPAP  Gout Allopurinol, colchicine  Depression Cymbalta   Mobility: Needs PT eval postprocedure  Goals of care   Code Status: Full Code     DVT prophylaxis:  SCDs Start: 03/31/23 1620 SCDs Start: 03/27/23 2243   Antimicrobials: Broad-spectrum IV antibiotics Fluid: None Consultants: Orthopedics, vascular surgery Family Communication: None at bedside  Status: Inpatient Level of care:  Telemetry Medical   Patient is from: Home Needs to continue in-hospital care: Pending clinical course, pending angiogram today Anticipated d/c to: Pending clinical course    Diet:  Diet Order             Diet NPO time specified  Diet effective now                   Scheduled Meds:  allopurinol  100 mg Oral Daily   amiodarone  200 mg Oral BID   carvedilol  3.125 mg Oral BID WC   docusate sodium  100 mg Oral BID   DULoxetine  60 mg Oral Daily   ezetimibe  10 mg Oral Daily   gabapentin  400 mg Oral TID   guaiFENesin  600 mg Oral BID   insulin aspart  0-9 Units Subcutaneous Q6H   insulin glargine-yfgn  30 Units Subcutaneous Daily   levothyroxine  88 mcg Oral QAC breakfast   metroNIDAZOLE  500 mg Oral Q12H   rosuvastatin  20 mg Oral QPM    PRN meds: acetaminophen **OR** acetaminophen, benzonatate, HYDROmorphone  (DILAUDID) injection, melatonin, methocarbamol **OR** methocarbamol (ROBAXIN) injection, metoCLOPramide **OR** metoCLOPramide (REGLAN) injection, naLOXone (NARCAN)  injection, ondansetron (ZOFRAN) IV, oxyCODONE, polyethylene glycol   Infusions:   ceFEPime (MAXIPIME) IV 2 g (04/01/23 0945)   vancomycin 2,000 mg (03/31/23 2208)    Antimicrobials: Anti-infectives (From admission, onward)    Start     Dose/Rate Route Frequency Ordered Stop   03/31/23 1015  ceFAZolin (ANCEF) IVPB 2g/100 mL premix        2 g 200 mL/hr over 30 Minutes Intravenous On call to O.R. 03/31/23 0921 03/31/23 1203   03/29/23 2200  vancomycin (VANCOREADY) IVPB 2000 mg/400 mL       Placed in "Followed by" Linked Group   2,000 mg 200 mL/hr over 120 Minutes Intravenous Every 48 hours 03/27/23 2257     03/28/23 1045  metroNIDAZOLE (FLAGYL) tablet 500 mg        500 mg Oral Every 12 hours 03/28/23 0955     03/27/23 2330  vancomycin (VANCOCIN) 2,500 mg in sodium chloride 0.9 % 500 mL IVPB       Placed in "Followed by" Linked Group   2,500 mg 262.5 mL/hr over 120 Minutes Intravenous  Once 03/27/23 2257 03/28/23 0241   03/27/23 2300  metroNIDAZOLE (FLAGYL) IVPB 500 mg  Status:  Discontinued        500 mg 100 mL/hr over 60 Minutes Intravenous Every 12 hours 03/27/23 2247 03/28/23 0955   03/27/23 2300  ceFEPIme (MAXIPIME) 2 g in sodium chloride 0.9 % 100 mL IVPB        2 g 200 mL/hr over 30 Minutes Intravenous Every 12 hours 03/27/23 2257     03/27/23 2100  ceFAZolin (ANCEF) IVPB 2g/100 mL premix        2 g 200 mL/hr over 30 Minutes Intravenous  Once 03/27/23 2058 03/27/23 2219       Objective: Vitals:   04/01/23 0424 04/01/23 0741  BP: (!) 161/63 (!) 160/63  Pulse: 63 60  Resp: 18 18  Temp: 97.8 F (36.6 C) 97.7 F (36.5 C)  SpO2: 96% 100%    Intake/Output Summary (Last 24 hours) at 04/01/2023 0954 Last data filed at 03/31/2023 1700 Gross per 24 hour  Intake 840 ml  Output 400 ml  Net 440 ml   Filed  Weights   03/30/23 0545 03/31/23 0500 03/31/23 1002  Weight: (!) 138.1 kg (!) 136.8 kg (!) 142.9 kg   Weight change: 6.083 kg Body mass index is 41.56 kg/m.   Physical Exam: General exam: Pleasant, elderly Caucasian male Skin: No rashes, lesions or ulcers. HEENT: Atraumatic, normocephalic, no obvious bleeding Lungs: Clear to auscultation bilaterally, on CPAP CVS: S1, S2, no murmur,   GI/Abd: Soft, nontender, nondistended, bowel sound present,   CNS: Alert, awake, oriented to place and person. Psychiatry: Mood appropriate,  Extremities: Trace bilateral pedal edema, no calf tenderness, right foot has wound VAC on.  Data Review: I have personally reviewed the laboratory data and studies available.  F/u labs ordered Unresulted Labs (From admission, onward)     Start     Ordered   04/01/23 0500  Basic metabolic panel  Daily,   R      03/31/23 0813   04/01/23 0500  CBC with Differential/Platelet  Daily,   R      03/31/23 0813   04/01/23 0500  CBC  Daily,   R      03/31/23 1619   03/31/23 0815  Gastrointestinal Panel by PCR , Stool  (Gastrointestinal Panel by PCR, Stool                                                                                                                                                     **Does Not include CLOSTRIDIUM DIFFICILE testing. **If CDIFF testing is needed, place order from the "C Difficile Testing" order set.**)  Once,   R        03/31/23 0814           Total time spent in review of labs and imaging, patient evaluation, formulation of plan, documentation and communication  with family: 45 minutes  Signed, Lorin Glass, MD [facility] 04/01/2023

## 2023-04-01 NOTE — Op Note (Signed)
hospital   Patient name: Harold [name] MRN: [id number] DOB: September 04, 1955 Sex: male  04/01/2023 Pre-operative Diagnosis: Right leg wound Post-operative diagnosis:  Same Surgeon:  Durene Cal Procedure Performed:  1.  Ultrasound-guided access, left femoral artery  2.  Abdominal aortogram with CO2  3.  Selective injection right superficial femoral artery  4.  Right leg angiogram  5.  Stent, right superficial femoral artery  6.  Conscious sedation, 50 minutes  7.  Closure device, Mynx   Indications: This is a 68 year old gentleman with infected hardware in his right leg requiring removal.  He has a wound there.  He has known arterial insufficiency.  He comes in for further evaluation and possible intervention  Procedure:  The patient was identified in the holding area and taken to room 8.  The patient was then placed supine on the table and prepped and draped in the usual sterile fashion.  A time out was called.  Conscious sedation was administered with the use of IV fentanyl and Versed under continuous physician and nurse monitoring.  Heart rate, blood pressure, and oxygen saturation were continuously monitored.  Total sedation time was 50 minutes.  Ultrasound was used to evaluate the left common femoral artery.  It was patent .  A digital ultrasound image was acquired.  A micropuncture needle was used to access the left common femoral artery under ultrasound guidance.  An 018 wire was advanced without resistance and a micropuncture sheath was placed.  The 018 wire was removed and a benson wire was placed.  The micropuncture sheath was exchanged for a 5 french sheath.  An omniflush catheter was advanced over the wire to the level of L-1.  An abdominal angiogram with CO2 was obtained.  Next, using the omniflush catheter and a benson wire, the aortic bifurcation was crossed and the catheter was placed into theright external iliac artery and right runoff was obtained.  Additional images of the  right leg were obtained using contrast with the catheter in the right superficial femoral artery Findings:   Aortogram: No significant renal artery stenosis.  The infrarenal abdominal aorta is widely patent without stenosis.  Bilateral common and external iliac arteries are widely patent.  Bilateral common femoral arteries are widely patent.  Right Lower Extremity: The right common femoral and profundofemoral artery are widely patent without stenosis.  The superficial femoral artery is patent throughout its course however there is a 10 cm segment in the adductor canal that is heavily calcified with greater than 70% stenosis.  Popliteal artery is patent throughout its course.  There is two-vessel runoff via the posterior tibial and peroneal.  The dominant is the posterior tibial.  There is reconstitution of a distal dorsalis pedis.  Left Lower Extremity: Not evaluated  Intervention: After the above images were acquired the decision was made to proceed with intervention.  A 6 French 45 cm sheath was advanced into the right external iliac artery.  I then advanced a versa core wire across the stenosis in the adductor canal.  The patient was fully heparinized.  I elected to primarily stent the lesions.  A 7 x 150 Eluvia stent was deployed and postdilated with a 6 mm balloon.  Completion imaging revealed resolution of the stenosis.  The groin was closed with a Mynx  Impression:  #1  Successful stenting of a diffuse moderate length stenosis in the right adductor canal using a 7 x 150 Eluvia stent  #2  Two-vessel runoff via the posterior tibial and peroneal  artery.  The posterior tibial is the dominant vessel  #3  If the patient has difficulty with wound healing, he could be considered for anterior tibial intervention    V. Durene Cal, M.D., [facility] [place] Office: [phone] Pager:  [phone]

## 2023-04-01 NOTE — TOC Initial Note (Addendum)
Transition of Care [facility]) - Initial/Assessment Note    Patient Details  Name: Harold [name] MRN: [id number] Date of Birth: 02/06/1956  Transition of Care [facility] CM/SW Contact:    Delilah Shan, LCSWA Phone Number: 04/01/2023, 3:37 PM  Clinical Narrative:                  CSW received consult for possible SNF placement at time of discharge. CSW spoke with patient regarding PT recommendation of SNF placement at time of discharge. Patient reports PTA he comes from home with spouse.Patient expressed understanding of PT recommendation and is agreeable to SNF placement at time of discharge. Patient reports preference for [facility] .Patient gave CSW permission to fax out for SNF placement. CSW discussed insurance authorization process. Patient has passr number in [place] must. Patients SSN was incorrect.CSW verified patients SSN with patient . CSW called [facility] and corrected patients SSN. Patient confirmed that his spouse can bring patients cpap from home to facility.  No further questions reported at this time. CSW to continue to follow and assist with discharge planning needs.     Expected Discharge Plan: Skilled Nursing Facility Barriers to Discharge: Continued Medical Work up   Patient Goals and CMS Choice Patient states their goals for this hospitalization and ongoing recovery are:: SNF CMS Medicare.gov Compare Post Acute Care list provided to:: Patient Choice offered to / list presented to : Patient      Expected Discharge Plan and Services In-house Referral: Clinical Social Work     Living arrangements for the past 2 months: Single Family Home                                      Prior Living Arrangements/Services Living arrangements for the past 2 months: Single Family Home Lives with:: Spouse Patient language and need for interpreter reviewed:: Yes Do you feel safe going back to the place where you live?: No   SNF  Need for Family Participation in Patient Care:  Yes (Comment) Care giver support system in place?: Yes (comment)   Criminal Activity/Legal Involvement Pertinent to Current Situation/Hospitalization: No - Comment as needed  Activities of Daily Living   ADL Screening (condition at time of admission) Independently performs ADLs?: Yes (appropriate for developmental age) Is the patient deaf or have difficulty hearing?: No Does the patient have difficulty seeing, even when wearing glasses/contacts?: No Does the patient have difficulty concentrating, remembering, or making decisions?: No  Permission Sought/Granted Permission sought to share information with : Case Manager, Magazine features editor, Family Supports Permission granted to share information with : Yes, Verbal Permission Granted     Permission granted to share info w AGENCY: SNF        Emotional Assessment   Attitude/Demeanor/Rapport: Gracious Affect (typically observed): Calm Orientation: : Oriented to Self, Oriented to Place, Oriented to  Time, Oriented to Situation Alcohol / Substance Use: Not Applicable Psych Involvement: No (comment)  Admission diagnosis:  Cellulitis and abscess of foot [L03.119, L02.619] Influenza A [J10.1] Acute renal failure superimposed on chronic kidney disease, unspecified acute renal failure type, unspecified CKD stage (HCC) [N17.9, N18.9] Patient Active Problem List   Diagnosis Date Noted   Cellulitis of right lower extremity 03/28/2023   CKD (chronic kidney disease) stage 4, GFR 15-29 ml/min (HCC) 03/28/2023   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, right (HCC) 03/28/2023   Abscess of skin of right ankle  03/28/2023   PAD (peripheral artery disease) (HCC) 03/28/2023   Influenza A 03/27/2023   Hypoglycemia due to insulin 01/09/2023   Toe fracture, left 01/09/2023   Ground-level fall 01/09/2023   Acute metabolic encephalopathy 01/09/2023   Hypokalemia 01/09/2023   Vitamin D deficiency 01/07/2023   Open ankle fracture  01/02/2023   Prolonged QT interval 01/02/2023   Fall at home, initial encounter 01/02/2023   Leukocytosis 01/02/2023   Acute kidney injury superimposed on chronic kidney disease (HCC) 01/02/2023   Normocytic anemia 01/02/2023   Paroxysmal atrial fibrillation (HCC) 01/02/2023   Uncontrolled type 2 diabetes mellitus with hyperglycemia, with long-term current use of insulin (HCC) 01/02/2023   Acquired hypothyroidism 01/02/2023   CKD stage 3b, GFR 30-44 ml/min (HCC) 11/07/2022   CAD in native artery 11/07/2022   PVD (peripheral vascular disease) (HCC) 11/06/2022   Hypercoagulable state due to persistent atrial fibrillation (HCC) 02/19/2022   Persistent atrial fibrillation (HCC) 01/28/2022   Lumbar stenosis with neurogenic claudication 07/26/2020   Diabetic neuropathy (HCC) 08/24/2019   Contusion of right elbow 09/02/2018   Trigger thumb of left hand 09/02/2018   Atherosclerosis of native artery of both lower extremities with intermittent claudication (HCC) 05/12/2018   Pain of left hand 03/26/2018   Trigger finger 03/03/2017   Pain in finger of left hand 03/03/2017   Snoring 08/20/2016   Morbid (severe) obesity due to excess calories (HCC) 04/18/2016   Venous stasis of both lower extremities - with edema 02/25/2015   Right-sided chest wall pain 05/08/2014   Gastroesophageal reflux disease without esophagitis 10/10/2013   Chronic heart failure with preserved ejection fraction (HFpEF) (HCC) 06/27/2013   COPD (chronic obstructive pulmonary disease) (HCC) 06/17/2013   Restrictive lung disease 06/17/2013   Myalgia and myositis 04/20/2013   Aftercare following surgery of the circulatory system, NEC 02/22/2013   Obesity, Class III, BMI 40-49.9 (morbid obesity) (HCC) 09/01/2012   Generalized weakness 09/01/2012   Preoperative cardiovascular examination 09/01/2012   Chronic renal insufficiency, stage II (mild) 04/30/2012   OSA on CPAP 04/29/2012   Pulmonary hypertension, 04/29/2012    Dyspnea on exertion   04/26/2012   CAD, LAD/CFX DES 04/28/12- staged RCA DES 04/29/12 after pt declined CABG, cath 05/14/13 stable CAD medical therapy    Carotid artery stenosis without cerebral infarction, bilateral 11/07/2011   Diabetes mellitus type 2 with neurological manifestations (HCC) 04/29/2011   Essential hypertension 04/29/2011   Neuropathy 04/29/2011   Hyperlipidemia associated with type 2 diabetes mellitus (HCC) 04/29/2011   Anxiety and depression 04/29/2011   PCP:  [name], MD Pharmacy:   [facility] [phone] - [place], [place] - [address] [place] [address] Phone: [phone] Fax: [phone]     Social Drivers of Health (SDOH) Social History: SDOH Screenings   Food Insecurity: No Food Insecurity (03/27/2023)  Housing: Low Risk  (03/28/2023)  Transportation Needs: No Transportation Needs (03/27/2023)  Utilities: Not At Risk (03/28/2023)  Social Connections: Unknown (03/28/2023)  Tobacco Use: Medium Risk (03/31/2023)   SDOH Interventions:     Readmission Risk Interventions     No data to display

## 2023-04-01 NOTE — Evaluation (Signed)
Occupational Therapy Re-Evaluation Patient Details Name: Harold [name] MRN: [id number] DOB: 16-May-1955 Today's Date: 04/01/2023   History of Present Illness   History of Present Illness: 68 y.o. male presents to [facility] 03/27/23 with productive cough and generalized weakness. Admitted with influenza A and cellulitis of R foot s/p hardware removal of the R ankle and application of wound vac on 2/10. Prior admit 11/24 for R ankle ORIF and in 12/24 for revision. Significant PMH: HTN, HLD, CAD s/p PCI, COPD, DM2, CKD stage IIIb, PVD, gout, OSA, neuropathy, COPD, a-fib, CAD s/p percutaneous coronary angioplasty, angina, obesity     Clinical Impressions Patient is s/p hard ware removal in RLE now with NWB status and wound vac resulting in functional limitations due to the deficits listed below (see OT Problem List). Re-evaluation completed due to recent surgical procedure. Patient will  continued to benefit from acute skilled OT to increase their safety and independence with ADL and functional mobility for ADL to allow facilitate discharge. Discharge recommendation updated due to pt requiring increased physical assistance during OT session. Patient will benefit from continued inpatient follow up therapy, <3 hours/day. OT will continue to follow pt acutely.        If plan is discharge home, recommend the following:   A lot of help with bathing/dressing/bathroom;Two people to help with walking and/or transfers;Assistance with cooking/housework;Assist for transportation;Help with stairs or ramp for entrance     Functional Status Assessment   Patient has had a recent decline in their functional status and demonstrates the ability to make significant improvements in function in a reasonable and predictable amount of time.     Equipment Recommendations   BSC/3in1;Other (comment) (defer to next level of care. Needs bariatric size BSC/3n1)      Precautions/Restrictions    Precautions Precautions: Fall Recall of Precautions/Restrictions: Intact Precaution/Restrictions Comments: flu +, RLE WB restrictions Required Braces or Orthoses: Other Brace Other Brace: R foot boot Restrictions Weight Bearing Restrictions Per Provider Order: Yes RLE Weight Bearing Per Provider Order: Non weight bearing     Mobility Bed Mobility Overal bed mobility: Needs Assistance Bed Mobility: Sit to Sidelying, Rolling Rolling: Supervision       Sit to sidelying: Min assist, Used rails General bed mobility comments: light min assist provided to bring BLE up onto bed. HOB flat    Transfers Overall transfer level: Needs assistance Equipment used: Rolling walker (2 wheels), Ambulation equipment used Transfers: Sit to/from Stand, Bed to chair/wheelchair/BSC Sit to Stand: Max assist           General transfer comment: Due to low surface level, pt required Max A to complete sit to stand from recliner with VC for hand placement with RW management and rocking for momentum. Pt with posterior bias when standing and reports of dizziness d/t NPO status. Pt sat back down. Huntley Dec plus utilized to complete chair to bed transfer for safety reasons. Transfer via Lift Equipment: Multimedia programmer Overall balance assessment: Needs assistance, History of Falls Sitting-balance support: No upper extremity supported, Feet supported Sitting balance-Leahy Scale: Fair Sitting balance - Comments: limited by NWB status on RLE Postural control: Posterior lean Standing balance support: Bilateral upper extremity supported, Reliant on assistive device for balance Standing balance-Leahy Scale: Zero Standing balance comment: reliant on Sara Plus for balance with posterior bias standing.      ADL either performed or assessed with clinical judgement   ADL Overall ADL's : Needs assistance/impaired Eating/Feeding: NPO   Grooming: Set up;Sitting  Upper Body Bathing: Set up;Sitting   Lower Body  Bathing: Total assistance;Bed level   Upper Body Dressing : Set up;Sitting   Lower Body Dressing: Total assistance;Bed level   Toilet Transfer: Total assistance;Cueing for safety;BSC/3in1 Toilet Transfer Details (indicate cue type and reason): use of lift equipment: Huntley Dec plus Toileting- Architect and Hygiene: Total assistance;Sit to/from stand Toileting - Clothing Manipulation Details (indicate cue type and reason): with use of sara plus to assist with standing             Vision Baseline Vision/History: 1 Wears glasses Ability to See in Adequate Light: 0 Adequate Patient Visual Report: No change from baseline Vision Assessment?: No apparent visual deficits     Perception Perception: Not tested       Praxis Praxis: Not tested       Pertinent Vitals/Pain Pain Assessment Pain Assessment: 0-10 Pain Score: 3  Pain Location: R foot Pain Descriptors / Indicators: Grimacing, Discomfort Pain Intervention(s): Limited activity within patient's tolerance, Monitored during session, Repositioned     Extremity/Trunk Assessment Upper Extremity Assessment Upper Extremity Assessment: Right hand dominant;Generalized weakness   Lower Extremity Assessment Lower Extremity Assessment: Defer to PT evaluation RLE Deficits / Details: Foot immobilized RLE: Unable to fully assess due to immobilization RLE Sensation: decreased light touch RLE Coordination: decreased gross motor   Cervical / Trunk Assessment Cervical / Trunk Assessment: Other exceptions Cervical / Trunk Exceptions: body habitus   Communication Communication Communication: No apparent difficulties   Cognition Arousal: Alert Behavior During Therapy: WFL for tasks assessed/performed Cognition: No apparent impairments    Following commands: Intact                  Home Living Family/patient expects to be discharged to:: Private residence Living Arrangements: Spouse/significant other Available Help  at Discharge: Family Type of Home: House Home Access: Stairs to enter Secretary/administrator of Steps: 3 Entrance Stairs-Rails: None Home Layout: One level     Bathroom Shower/Tub: Tub/shower unit;Sponge bathes at baseline   [employer]: Handicapped height Bathroom Accessibility: Yes   Home Equipment: Pharmacist, hospital (2 wheels)          Prior Functioning/Environment Prior Level of Function : History of Falls (last six months);Needs assist             Mobility Comments: in dec. returned home from rehab, 4 falls since returning home; reports using RW for mobility ADLs Comments: needing assist for ADLs from significant other    OT Problem List: Decreased strength;Decreased activity tolerance;Impaired balance (sitting and/or standing);Obesity;Decreased knowledge of precautions;Decreased knowledge of use of DME or AE;Decreased safety awareness   OT Treatment/Interventions: Self-care/ADL training;Therapeutic exercise;DME and/or AE instruction;Therapeutic activities;Patient/family education;Balance training;Energy conservation      OT Goals(Current goals can be found in the care plan section)   Acute Rehab OT Goals Patient Stated Goal: to return to bed OT Goal Formulation: With patient Time For Goal Achievement: 04/15/23 Potential to Achieve Goals: Good   OT Frequency:  Min 1X/week       AM-PAC OT "6 Clicks" Daily Activity     Outcome Measure Help from another person eating meals?: None Help from another person taking care of personal grooming?: None Help from another person toileting, which includes using toliet, bedpan, or urinal?: Total Help from another person bathing (including washing, rinsing, drying)?: A Lot Help from another person to put on and taking off regular upper body clothing?: A Little Help from another person to put on and taking off  regular lower body clothing?: Total 6 Click Score: 15   End of Session Equipment Utilized During  Treatment: Gait belt;Rolling walker (2 wheels);Other (comment) Nurse Communication: Other (comment) (IV needing maintenance.)  Activity Tolerance: Treatment limited secondary to medical complications (Comment) (complaints of dizziness d/t to NPO status) Patient left: in bed;with call bell/phone within reach;with bed alarm set  OT Visit Diagnosis: Other abnormalities of gait and mobility (R26.89);Muscle weakness (generalized) (M62.81);History of falling (Z91.81)                Time: 7628-3151 OT Time Calculation (min): 36 min Charges:  OT General Charges $OT Visit: 1 Visit OT Evaluation $OT Re-eval: 1 Re-eval  Limmie Patricia, OTR/L,CBIS  Supplemental OT - [facility] and [facility] Secure Chat Preferred    Nelsy Madonna, Charisse March 04/01/2023, 11:20 AM

## 2023-04-01 NOTE — [place] FL2 (Signed)
[place] MEDICAID FL2 LEVEL OF CARE FORM     IDENTIFICATION  Patient Name: Harold [name] Birthdate: 1955-08-15 Sex: male Admission Date (Current Location): 03/27/2023  [place] and [place] Number:  Producer, television/film/video and Address:  [facility] [place], 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, right (HCC) 03/28/2023   Abscess of skin of right ankle 03/28/2023   PAD (peripheral artery disease) (HCC) 03/28/2023   Influenza A 03/27/2023   Hypoglycemia due to insulin 01/09/2023   Toe fracture, left 01/09/2023   Ground-level fall 01/09/2023   Acute metabolic encephalopathy 01/09/2023   Hypokalemia 01/09/2023   Vitamin D deficiency 01/07/2023   Open ankle fracture 01/02/2023   Prolonged QT interval 01/02/2023   Fall at home, initial encounter 01/02/2023   Leukocytosis 01/02/2023   Acute kidney injury superimposed on chronic kidney disease (HCC) 01/02/2023   Normocytic anemia 01/02/2023   Paroxysmal atrial fibrillation (HCC) 01/02/2023   Uncontrolled type 2 diabetes mellitus with hyperglycemia, with long-term current use of insulin (HCC) 01/02/2023   Acquired hypothyroidism 01/02/2023   CKD stage 3b, GFR 30-44 ml/min (HCC) 11/07/2022   CAD in native artery 11/07/2022   PVD (peripheral vascular disease) (HCC) 11/06/2022   Hypercoagulable state due to persistent atrial  fibrillation (HCC) 02/19/2022   Persistent atrial fibrillation (HCC) 01/28/2022   Lumbar stenosis with neurogenic claudication 07/26/2020   Diabetic neuropathy (HCC) 08/24/2019   Contusion of right elbow 09/02/2018   Trigger thumb of left hand 09/02/2018   Atherosclerosis of native artery of both lower extremities with intermittent claudication (HCC) 05/12/2018   Pain of left hand 03/26/2018   Trigger finger 03/03/2017   Pain in finger of left hand 03/03/2017   Snoring 08/20/2016   Morbid (severe) obesity due to excess calories (HCC) 04/18/2016   Venous stasis of both lower extremities - with edema 02/25/2015   Right-sided chest wall pain 05/08/2014   Gastroesophageal reflux disease without esophagitis 10/10/2013   Chronic heart failure with preserved ejection fraction (HFpEF) (HCC) 06/27/2013   COPD (chronic obstructive pulmonary disease) (HCC) 06/17/2013   Restrictive lung disease 06/17/2013   Myalgia and myositis 04/20/2013   Aftercare following surgery of the circulatory system, NEC 02/22/2013   Obesity, Class III, BMI 40-49.9 (morbid obesity) (HCC) 09/01/2012   Generalized weakness 09/01/2012   Preoperative cardiovascular examination 09/01/2012   Chronic renal insufficiency, stage II (mild) 04/30/2012   OSA on CPAP 04/29/2012   Pulmonary hypertension, 04/29/2012   Dyspnea on exertion   04/26/2012   CAD, LAD/CFX DES 04/28/12- staged RCA DES 04/29/12 after pt declined CABG, cath 05/14/13 stable CAD medical therapy    Carotid artery stenosis without cerebral infarction, bilateral 11/07/2011   Diabetes mellitus type 2 with neurological manifestations (HCC) 04/29/2011   Essential hypertension 04/29/2011   Neuropathy 04/29/2011   Hyperlipidemia associated with type 2 diabetes mellitus (HCC) 04/29/2011   Anxiety and  depression 04/29/2011    Orientation RESPIRATION BLADDER Height & Weight     Self, Time, Situation, Place  Normal Continent Weight: (!) 315 lb 14.7 oz (143.3  kg) Height:  6\' 1"  (185.4 cm)  BEHAVIORAL SYMPTOMS/MOOD NEUROLOGICAL BOWEL NUTRITION STATUS      Incontinent Diet (Please see discharge summary)  AMBULATORY STATUS COMMUNICATION OF NEEDS Skin   Extensive Assist Verbally Other (Comment) (Ecchymosis,buttocks,R,Erythema,Foot,R,lower,L,Wound/Incision LDAs,Incision closed,ankle,R,Wound/Incision open or dehisced foot,anterior,R,cellulitis,Neg.Pressure,wound therapy,ankle,R,lateral)                       Personal Care Assistance Level of Assistance  Bathing, Feeding, Dressing Bathing Assistance: Maximum assistance Feeding assistance: Independent Dressing Assistance: Maximum assistance     Functional Limitations Info  Sight, Hearing, Speech Sight Info:  Financial trader) Hearing Info: Adequate Speech Info: Adequate    SPECIAL CARE FACTORS FREQUENCY  PT (By licensed PT), OT (By licensed OT)     PT Frequency: 5x min weekly OT Frequency: 5x min weekly            Contractures Contractures Info: Not present    Additional Factors Info  Code Status, Allergies, Isolation Precautions Code Status Info: FULL Allergies Info: Doxycycline,Septra (bactrim),Penicillins,Metformin And Related,Other-General Anesthesia -,Sulfamethoxazole-trimethoprim,Wellbutrin (bupropion)     Isolation Precautions Info: Droplet Precautions     Current Medications (04/01/2023):  This is the current hospital active medication list Current Facility-Administered Medications  Medication Dose Route Frequency Provider Last Rate Last Admin   0.9 %  sodium chloride infusion  250 mL Intravenous PRN Nada Libman, MD       0.9% sodium chloride infusion  1 mL/kg/hr Intravenous Continuous Nada Libman, MD 142.9 mL/hr at 04/01/23 1503 1 mL/kg/hr at 04/01/23 1503   acetaminophen (TYLENOL) tablet 650 mg  650 mg Oral Q6H PRN [place] Bali, PA-C       Or   acetaminophen (TYLENOL) suppository 650 mg  650 mg Rectal Q6H PRN Sharon Seller, Sarah A, PA-C       allopurinol  (ZYLOPRIM) tablet 100 mg  100 mg Oral Daily [place] Bali, PA-C   100 mg at 04/01/23 1610   amiodarone (PACERONE) tablet 200 mg  200 mg Oral BID [place] Bali, PA-C   200 mg at 04/01/23 0932   aspirin EC tablet 81 mg  81 mg Oral Daily Nada Libman, MD   81 mg at 04/01/23 1530   benzonatate (TESSALON) capsule 200 mg  200 mg Oral TID PRN [place] Bali, PA-C       carvedilol (COREG) tablet 3.125 mg  3.125 mg Oral BID WC Thyra Breed A, PA-C   3.125 mg at 04/01/23 0932   ceFEPIme (MAXIPIME) 2 g in sodium chloride 0.9 % 100 mL IVPB  2 g Intravenous Q12H [place] B[place], PA-C 200 mL/hr at 04/01/23 0945 2 g at 04/01/23 0945   [START ON 04/02/2023] clopidogrel (PLAVIX) tablet 75 mg  75 mg Oral Q breakfast Nada Libman, MD       docusate sodium (COLACE) capsule 100 mg  100 mg Oral BID Thyra Breed A, PA-C   100 mg at 03/31/23 2102   DULoxetine (CYMBALTA) DR capsule 60 mg  60 mg Oral Daily West Bali, PA-C   60 mg at 04/01/23 0932   ezetimibe (ZETIA) tablet 10 mg  10 mg Oral Daily West Bali, PA-C   10 mg at 04/01/23 9604   gabapentin (NEURONTIN) capsule 400 mg  400 mg Oral TID West Bali, PA-C  400 mg at 04/01/23 1530   guaiFENesin (MUCINEX) 12 hr tablet 600 mg  600 mg Oral BID West Bali, PA-C   600 mg at 04/01/23 0932   hydrALAZINE (APRESOLINE) injection 5 mg  5 mg Intravenous Q20 Min PRN Nada Libman, MD       HYDROmorphone (DILAUDID) injection 0.5 mg  0.5 mg Intravenous Q4H PRN [place] Bali, PA-C   0.5 mg at 04/01/23 1536   insulin aspart (novoLOG) injection 0-9 Units  0-9 Units Subcutaneous Q6H West Bali, PA-C   1 Units at 03/31/23 1948   insulin glargine-yfgn (SEMGLEE) injection 30 Units  30 Units Subcutaneous Daily West Bali, PA-C   30 Units at 03/31/23 0930   levothyroxine (SYNTHROID) tablet 88 mcg  88 mcg Oral QAC breakfast West Bali, PA-C   88 mcg at 04/01/23 0615   melatonin tablet 3 mg  3 mg Oral QHS PRN West Bali,  PA-C   3 mg at 04/01/23 0235   methocarbamol (ROBAXIN) tablet 500 mg  500 mg Oral Q6H PRN [place] Bali, PA-C   500 mg at 04/01/23 1536   Or   methocarbamol (ROBAXIN) injection 500 mg  500 mg Intravenous Q6H PRN West Bali, PA-C       metoCLOPramide (REGLAN) tablet 5-10 mg  5-10 mg Oral Q8H PRN Sharon Seller, Sarah A, PA-C       Or   metoCLOPramide (REGLAN) injection 5-10 mg  5-10 mg Intravenous Q8H PRN West Bali, PA-C       metroNIDAZOLE (FLAGYL) tablet 500 mg  500 mg Oral Q12H West Bali, PA-C   500 mg at 04/01/23 0932   naloxone (NARCAN) injection 0.4 mg  0.4 mg Intravenous PRN [place] B[place], PA-C       ondansetron (ZOFRAN) injection 4 mg  4 mg Intravenous Q6H PRN Thyra Breed A, PA-C       oxyCODONE (Oxy IR/ROXICODONE) immediate release tablet 5 mg  5 mg Oral Q4H PRN [place] B[place], PA-C   5 mg at 04/01/23 0932   polyethylene glycol (MIRALAX / GLYCOLAX) packet 17 g  17 g Oral Daily PRN [place], PA-C       rosuvastatin (CRESTOR) tablet 20 mg  20 mg Oral QPM Thyra Breed A, PA-C   20 mg at 03/31/23 1623   sodium chloride flush (NS) 0.9 % injection 3 mL  3 mL Intravenous Q12H Nada Libman, MD       sodium chloride flush (NS) 0.9 % injection 3 mL  3 mL Intravenous PRN Nada Libman, MD       vancomycin Luna Kitchens) IVPB 2000 mg/400 mL  2,000 mg Intravenous Q48H Thyra Breed A, PA-C 200 mL/hr at 03/31/23 2208 2,000 mg at 03/31/23 2208     Discharge Medications: Please see discharge summary for a list of discharge medications.  Relevant Imaging Results:  Relevant Lab Results:   Additional Information [id number]  Delilah Shan, LCSWA

## 2023-04-01 NOTE — Progress Notes (Signed)
Physical Therapy Re-Evaluation Patient Details Name: Harold [name] MRN: [id number] DOB: 11-09-1955 Today's Date: 04/01/2023   History of Present Illness 68 y.o. male presents to [facility] 03/27/23 with productive cough and generalized weakness. Admitted with influenza A and cellulitis of R foot s/p hardware removal of the R ankle and application of wound vac on 2/10. Prior admit 11/24 for R ankle ORIF and in 12/24 for revision. Significant PMH: HTN, HLD, CAD s/p PCI, COPD, DM2, CKD stage IIIb, PVD, gout, OSA, neuropathy, COPD, a-fib, CAD s/p percutaneous coronary angioplasty, angina, obesity    PT Comments  Patient willing to participate with therapy. Patient limited by fatigue due to NPO status in preparation for upcoming procedure. Prior to transfers, patient was educated about his WB restrictions. Patient independently transitioned himself to EOB, demonstrating good trunk control to sit upright and moves his legs with ease. Patient agreeable to transfer from bed to chair by demonstrating a stand pivot transfer. Patient requried Min A +2 for sit > stand and stand pivot transfer due to unsteadiness on L foot and reports of dizziness. Vitals were assessed and stable throughout the session: BP: 125/60 (MAP79) after supine > sit; Pulse: 60-71bpm. Acute PT will follow up with patient after upcoming procedure to maximize an optimal level of independence. Patient will benefit from continued inpatient follow up therapy, <3 hours/day    If plan is discharge home, recommend the following: A lot of help with walking and/or transfers;A lot of help with bathing/dressing/bathroom;Assist for transportation;Help with stairs or ramp for entrance   Can travel by private vehicle     No  Equipment Recommendations  BSC/3in1    Recommendations for Other Services       Precautions / Restrictions Precautions Precautions: Fall Recall of Precautions/Restrictions: Intact Precaution/Restrictions Comments: flu  + Required Braces or Orthoses: Other Brace Other Brace: R foot boot Restrictions Weight Bearing Restrictions Per Provider Order: Yes RLE Weight Bearing Per Provider Order: Non weight bearing     Mobility  Bed Mobility Overal bed mobility: Independent Bed Mobility: Supine to Sit     Supine to sit: Independent     General bed mobility comments: Patient transitioned himself to EOB, able to move operative foot independently    Transfers Overall transfer level: Needs assistance Equipment used: Rolling walker (2 wheels) Transfers: Bed to chair/wheelchair/BSC, Sit to/from Stand Sit to Stand: Min assist, +2 physical assistance Stand pivot transfers: Min assist         General transfer comment: After multiple attempts patient able to demonstrate a stand pivot to chair, requried cues to complete the transfer    Ambulation/Gait                   Stairs             Wheelchair Mobility     Tilt Bed    Modified Rankin (Stroke Patients Only)       Balance Overall balance assessment: Needs assistance Sitting-balance support: No upper extremity supported, Feet supported Sitting balance-Leahy Scale: Good Sitting balance - Comments: statically at EOB   Standing balance support: Bilateral upper extremity supported, Reliant on assistive device for balance Standing balance-Leahy Scale: Fair                              Hotel manager: No apparent difficulties  Cognition Arousal: Alert Behavior During Therapy: WFL for tasks assessed/performed   PT - Cognitive impairments: No apparent impairments  Following commands: Intact      Cueing    Exercises      General Comments General comments (skin integrity, edema, etc.): wound vac intact during session. IV beeping d/t line occluded. Unable to restart IV via pump. Nursing was contacted although issue not resolved.      Pertinent  Vitals/Pain Pain Assessment Pain Assessment: Faces Faces Pain Scale: Hurts a little bit Pain Location: R foot Pain Descriptors / Indicators: Grimacing Pain Intervention(s): Limited activity within patient's tolerance, Monitored during session    Home Living Family/patient expects to be discharged to:: Private residence Living Arrangements: Spouse/significant other Available Help at Discharge: Family Type of Home: House Home Access: Stairs to enter Entrance Stairs-Rails: None Secretary/administrator of Steps: 3   Home Layout: One level Home Equipment: Pharmacist, hospital (2 wheels)      Prior Function            PT Goals (current goals can now be found in the care plan section) Acute Rehab PT Goals Patient Stated Goal: to get better PT Goal Formulation: With patient Time For Goal Achievement: 04/11/23 Potential to Achieve Goals: Good Progress towards PT goals: Progressing toward goals    Frequency    Min 1X/week      PT Plan      Co-evaluation              AM-PAC PT "6 Clicks" Mobility   Outcome Measure  Help needed turning from your back to your side while in a flat bed without using bedrails?: None Help needed moving from lying on your back to sitting on the side of a flat bed without using bedrails?: None Help needed moving to and from a bed to a chair (including a wheelchair)?: A Little Help needed standing up from a chair using your arms (e.g., wheelchair or bedside chair)?: A Little Help needed to walk in hospital room?: A Lot Help needed climbing 3-5 steps with a railing? : A Lot 6 Click Score: 18    End of Session Equipment Utilized During Treatment: Gait belt Activity Tolerance: Patient limited by fatigue Patient left: in chair;with call bell/phone within reach;with chair alarm set   PT Visit Diagnosis: History of falling (Z91.81);Muscle weakness (generalized) (M62.81);Other abnormalities of gait and mobility (R26.89)     Time:  0816-0900 PT Time Calculation (min) (ACUTE ONLY): 44 min  Charges:    $Therapeutic Activity: 23-37 mins PT General Charges $$ ACUTE PT VISIT: 1 Visit                     Doreen Beam, SPT   Harold [name] 04/01/2023, 11:19 AM

## 2023-04-02 ENCOUNTER — Inpatient Hospital Stay (HOSPITAL_COMMUNITY): Payer: Medicare Other | Admitting: Anesthesiology

## 2023-04-02 ENCOUNTER — Other Ambulatory Visit: Payer: Self-pay

## 2023-04-02 ENCOUNTER — Encounter (HOSPITAL_COMMUNITY)
Admission: EM | Disposition: A | Discharge status: Home Health | Payer: Self-pay | Source: Home / Self Care | Attending: Family Medicine

## 2023-04-02 ENCOUNTER — Encounter (HOSPITAL_COMMUNITY): Payer: Self-pay | Admitting: Internal Medicine

## 2023-04-02 DIAGNOSIS — L089 Local infection of the skin and subcutaneous tissue, unspecified: Secondary | ICD-10-CM

## 2023-04-02 DIAGNOSIS — J101 Influenza due to other identified influenza virus with other respiratory manifestations: Secondary | ICD-10-CM | POA: Diagnosis not present

## 2023-04-02 DIAGNOSIS — Z87891 Personal history of nicotine dependence: Secondary | ICD-10-CM

## 2023-04-02 DIAGNOSIS — L02415 Cutaneous abscess of right lower limb: Secondary | ICD-10-CM | POA: Diagnosis not present

## 2023-04-02 DIAGNOSIS — L02619 Cutaneous abscess of unspecified foot: Secondary | ICD-10-CM

## 2023-04-02 DIAGNOSIS — L03119 Cellulitis of unspecified part of limb: Secondary | ICD-10-CM

## 2023-04-02 DIAGNOSIS — I10 Essential (primary) hypertension: Secondary | ICD-10-CM | POA: Diagnosis not present

## 2023-04-02 DIAGNOSIS — I2510 Atherosclerotic heart disease of native coronary artery without angina pectoris: Secondary | ICD-10-CM | POA: Diagnosis not present

## 2023-04-02 DIAGNOSIS — I739 Peripheral vascular disease, unspecified: Secondary | ICD-10-CM | POA: Diagnosis not present

## 2023-04-02 DIAGNOSIS — L97314 Non-pressure chronic ulcer of right ankle with necrosis of bone: Secondary | ICD-10-CM | POA: Diagnosis not present

## 2023-04-02 HISTORY — PX: I & D EXTREMITY: SHX5045

## 2023-04-02 LAB — CBC WITH DIFFERENTIAL/PLATELET
Abs Immature Granulocytes: 0.06 10*3/uL (ref 0.00–0.07)
Basophils Absolute: 0.1 10*3/uL (ref 0.0–0.1)
Basophils Relative: 1 %
Eosinophils Absolute: 0.4 10*3/uL (ref 0.0–0.5)
Eosinophils Relative: 5 %
HCT: 31.5 % — ABNORMAL LOW (ref 39.0–52.0)
Hemoglobin: 10.6 g/dL — ABNORMAL LOW (ref 13.0–17.0)
Immature Granulocytes: 1 %
Lymphocytes Relative: 15 %
Lymphs Abs: 1.1 10*3/uL (ref 0.7–4.0)
MCH: 28.8 pg (ref 26.0–34.0)
MCHC: 33.7 g/dL (ref 30.0–36.0)
MCV: 85.6 fL (ref 80.0–100.0)
Monocytes Absolute: 0.6 10*3/uL (ref 0.1–1.0)
Monocytes Relative: 8 %
Neutro Abs: 5 10*3/uL (ref 1.7–7.7)
Neutrophils Relative %: 70 %
Platelets: 210 10*3/uL (ref 150–400)
RBC: 3.68 MIL/uL — ABNORMAL LOW (ref 4.22–5.81)
RDW: 13.8 % (ref 11.5–15.5)
WBC: 7.1 10*3/uL (ref 4.0–10.5)
nRBC: 0 % (ref 0.0–0.2)

## 2023-04-02 LAB — LIPID PANEL
Cholesterol: 84 mg/dL (ref 0–200)
HDL: 24 mg/dL — ABNORMAL LOW (ref >40)
LDL Cholesterol: 28 mg/dL (ref 0–99)
Total CHOL/HDL Ratio: 3.5 {ratio}
Triglycerides: 161 mg/dL — ABNORMAL HIGH (ref <150)
VLDL: 32 mg/dL (ref 0–40)

## 2023-04-02 LAB — BASIC METABOLIC PANEL
Anion gap: 13 (ref 5–15)
BUN: 20 mg/dL (ref 8–23)
CO2: 20 mmol/L — ABNORMAL LOW (ref 22–32)
Calcium: 9 mg/dL (ref 8.9–10.3)
Chloride: 105 mmol/L (ref 98–111)
Creatinine, Ser: 2.21 mg/dL — ABNORMAL HIGH (ref 0.61–1.24)
GFR, Estimated: 31 mL/min — ABNORMAL LOW (ref >60)
Glucose, Bld: 139 mg/dL — ABNORMAL HIGH (ref 70–99)
Potassium: 3.5 mmol/L (ref 3.5–5.1)
Sodium: 138 mmol/L (ref 135–145)

## 2023-04-02 LAB — GLUCOSE, CAPILLARY
Glucose-Capillary: 120 mg/dL — ABNORMAL HIGH (ref 70–99)
Glucose-Capillary: 121 mg/dL — ABNORMAL HIGH (ref 70–99)
Glucose-Capillary: 125 mg/dL — ABNORMAL HIGH (ref 70–99)
Glucose-Capillary: 133 mg/dL — ABNORMAL HIGH (ref 70–99)
Glucose-Capillary: 136 mg/dL — ABNORMAL HIGH (ref 70–99)

## 2023-04-02 SURGERY — IRRIGATION AND DEBRIDEMENT EXTREMITY
Anesthesia: General | Site: Ankle | Laterality: Right

## 2023-04-02 MED ORDER — AMISULPRIDE (ANTIEMETIC) 5 MG/2ML IV SOLN: 10.0000 mg | Freq: Once | INTRAVENOUS | Status: DC | PRN

## 2023-04-02 MED ORDER — PROPOFOL 10 MG/ML IV BOLUS
INTRAVENOUS | Status: AC
  Filled 2023-04-02: qty 20

## 2023-04-02 MED ORDER — CHLORHEXIDINE GLUCONATE 0.12 % MT SOLN: 15.0000 mL | Freq: Once | OROMUCOSAL | Status: AC

## 2023-04-02 MED ORDER — OXYCODONE HCL 5 MG PO TABS
5.0000 mg | ORAL_TABLET | Freq: Once | ORAL | Status: AC | PRN
  Administered 2023-04-02: 5 mg via ORAL

## 2023-04-02 MED ORDER — CHLORHEXIDINE GLUCONATE 0.12 % MT SOLN
OROMUCOSAL | Status: AC
  Administered 2023-04-02: 15 mL via OROMUCOSAL
  Filled 2023-04-02: qty 15

## 2023-04-02 MED ORDER — OXYCODONE HCL 5 MG/5ML PO SOLN: 5.0000 mg | Freq: Once | ORAL | Status: AC | PRN

## 2023-04-02 MED ORDER — OXYCODONE HCL 5 MG PO TABS
ORAL_TABLET | ORAL | Status: AC
Start: 2023-04-02 — End: 2023-04-03
  Filled 2023-04-02: qty 1

## 2023-04-02 MED ORDER — 0.9 % SODIUM CHLORIDE (POUR BTL) OPTIME
TOPICAL | Status: DC | PRN
  Administered 2023-04-02: 1000 mL

## 2023-04-02 MED ORDER — FUROSEMIDE 40 MG PO TABS
40.0000 mg | ORAL_TABLET | Freq: Two times a day (BID) | ORAL | Status: DC
  Administered 2023-04-03 – 2023-04-04 (×3): 40 mg via ORAL
  Filled 2023-04-02 (×3): qty 1

## 2023-04-02 MED ORDER — SODIUM CHLORIDE 0.45 % IV SOLN
INTRAVENOUS | Status: DC | PRN
Start: 2023-04-02 — End: 2023-04-02

## 2023-04-02 MED ORDER — FENTANYL CITRATE (PF) 250 MCG/5ML IJ SOLN
INTRAMUSCULAR | Status: DC | PRN
  Administered 2023-04-02: 50 ug via INTRAVENOUS

## 2023-04-02 MED ORDER — LIDOCAINE HCL (PF) 2 % IJ SOLN
INTRAMUSCULAR | Status: DC | PRN
  Administered 2023-04-02: 60 mg via INTRADERMAL

## 2023-04-02 MED ORDER — HYDROMORPHONE HCL 1 MG/ML IJ SOLN
0.2500 mg | INTRAMUSCULAR | Status: DC | PRN
  Administered 2023-04-02: 0.5 mg via INTRAVENOUS

## 2023-04-02 MED ORDER — SODIUM CHLORIDE 0.9% FLUSH
3.0000 mL | Freq: Two times a day (BID) | INTRAVENOUS | Status: DC
  Administered 2023-04-03: 3 mL via INTRAVENOUS
  Administered 2023-04-03: 5 mL via INTRAVENOUS
  Administered 2023-04-04 – 2023-04-05 (×2): 10 mL via INTRAVENOUS
  Administered 2023-04-06: 3 mL via INTRAVENOUS
  Administered 2023-04-07 – 2023-04-10 (×6): 10 mL via INTRAVENOUS
  Administered 2023-04-11: 3 mL via INTRAVENOUS
  Administered 2023-04-11 – 2023-04-12 (×2): 10 mL via INTRAVENOUS
  Administered 2023-04-12 – 2023-04-14 (×3): 3 mL via INTRAVENOUS
  Administered 2023-04-15 – 2023-04-17 (×2): 10 mL via INTRAVENOUS
  Administered 2023-04-17: 3 mL via INTRAVENOUS
  Administered 2023-04-18 – 2023-04-21 (×7): 10 mL via INTRAVENOUS
  Administered 2023-04-22 – 2023-04-25 (×6): 3 mL via INTRAVENOUS
  Administered 2023-04-25 – 2023-04-28 (×6): 10 mL via INTRAVENOUS
  Administered 2023-04-28: 3 mL via INTRAVENOUS
  Administered 2023-04-29: 10 mL via INTRAVENOUS
  Administered 2023-04-30: 20 mL via INTRAVENOUS
  Administered 2023-04-30: 10 mL via INTRAVENOUS
  Administered 2023-04-30: 3 mL via INTRAVENOUS
  Administered 2023-05-01 (×2): 10 mL via INTRAVENOUS
  Administered 2023-05-02 – 2023-05-03 (×3): 3 mL via INTRAVENOUS
  Administered 2023-05-03 – 2023-05-04 (×3): 10 mL via INTRAVENOUS
  Administered 2023-05-05: 8 mL via INTRAVENOUS
  Administered 2023-05-05 – 2023-05-06 (×2): 10 mL via INTRAVENOUS
  Administered 2023-05-07: 3 mL via INTRAVENOUS
  Administered 2023-05-07 – 2023-05-09 (×3): 10 mL via INTRAVENOUS

## 2023-04-02 MED ORDER — HYDROMORPHONE HCL 1 MG/ML IJ SOLN
INTRAMUSCULAR | Status: AC
  Filled 2023-04-02: qty 1

## 2023-04-02 MED ORDER — FENTANYL CITRATE (PF) 250 MCG/5ML IJ SOLN
INTRAMUSCULAR | Status: AC
  Filled 2023-04-02: qty 5

## 2023-04-02 MED ORDER — CEFAZOLIN SODIUM-DEXTROSE 2-4 GM/100ML-% IV SOLN: 2.0000 g | Freq: Three times a day (TID) | INTRAVENOUS | Status: DC

## 2023-04-02 MED ORDER — SODIUM CHLORIDE 0.9% FLUSH: 3.0000 mL | INTRAVENOUS | Status: DC | PRN

## 2023-04-02 MED ORDER — ORAL CARE MOUTH RINSE: 15.0000 mL | Freq: Once | OROMUCOSAL | Status: AC

## 2023-04-02 MED ORDER — PROPOFOL 10 MG/ML IV BOLUS
INTRAVENOUS | Status: DC | PRN
Start: 2023-04-02 — End: 2023-04-02
  Administered 2023-04-02: 150 mg via INTRAVENOUS

## 2023-04-02 SURGICAL SUPPLY — 36 items
BAG COUNTER SPONGE SURGICOUNT (BAG) IMPLANT
BLADE SURG 21 STRL SS (BLADE) ×1 IMPLANT
BNDG COHESIVE 6X5 TAN ST LF (GAUZE/BANDAGES/DRESSINGS) IMPLANT
BNDG GAUZE DERMACEA FLUFF 4 (GAUZE/BANDAGES/DRESSINGS) ×2 IMPLANT
CLEANSER WND VASHE 34 (WOUND CARE) IMPLANT
COVER SURGICAL LIGHT HANDLE (MISCELLANEOUS) ×2 IMPLANT
DRAPE DERMATAC (DRAPES) IMPLANT
DRAPE INCISE IOBAN 85X60 (DRAPES) IMPLANT
DRAPE U-SHAPE 47X51 STRL (DRAPES) ×1 IMPLANT
DRESSING PREVENA PLUS CUSTOM (GAUZE/BANDAGES/DRESSINGS) IMPLANT
DRESSING VERAFLO CLEANS CC MED (GAUZE/BANDAGES/DRESSINGS) IMPLANT
DRSG ADAPTIC 3X8 NADH LF (GAUZE/BANDAGES/DRESSINGS) ×1 IMPLANT
DRSG PREVENA PLUS CUSTOM (GAUZE/BANDAGES/DRESSINGS) ×1 IMPLANT
DRSG VERAFLO CLEANSE CC MED (GAUZE/BANDAGES/DRESSINGS) ×1 IMPLANT
DURAPREP 26ML APPLICATOR (WOUND CARE) ×1 IMPLANT
ELECT REM PT RETURN 9FT ADLT (ELECTROSURGICAL) IMPLANT
ELECTRODE REM PT RTRN 9FT ADLT (ELECTROSURGICAL) IMPLANT
GAUZE SPONGE 4X4 12PLY STRL (GAUZE/BANDAGES/DRESSINGS) ×1 IMPLANT
GLOVE BIOGEL PI IND STRL 9 (GLOVE) ×1 IMPLANT
GLOVE SURG ORTHO 9.0 STRL STRW (GLOVE) ×1 IMPLANT
GOWN STRL REUS W/ TWL XL LVL3 (GOWN DISPOSABLE) ×2 IMPLANT
GRAFT SKIN WND SURGICLOSE M95 (Tissue) IMPLANT
KIT BASIN OR (CUSTOM PROCEDURE TRAY) ×1 IMPLANT
KIT TURNOVER KIT B (KITS) ×1 IMPLANT
MANIFOLD NEPTUNE II (INSTRUMENTS) ×1 IMPLANT
NS IRRIG 1000ML POUR BTL (IV SOLUTION) ×1 IMPLANT
PACK ORTHO EXTREMITY (CUSTOM PROCEDURE TRAY) ×1 IMPLANT
PAD ARMBOARD 7.5X6 YLW CONV (MISCELLANEOUS) ×2 IMPLANT
SET HNDPC FAN SPRY TIP SCT (DISPOSABLE) IMPLANT
STOCKINETTE IMPERVIOUS 9X36 MD (GAUZE/BANDAGES/DRESSINGS) IMPLANT
SUT ETHILON 2 0 PSLX (SUTURE) ×1 IMPLANT
SWAB COLLECTION DEVICE MRSA (MISCELLANEOUS) ×1 IMPLANT
SWAB CULTURE ESWAB REG 1ML (MISCELLANEOUS) IMPLANT
TOWEL GREEN STERILE (TOWEL DISPOSABLE) ×1 IMPLANT
TUBE CONNECTING 12X1/4 (SUCTIONS) ×1 IMPLANT
YANKAUER SUCT BULB TIP NO VENT (SUCTIONS) ×1 IMPLANT

## 2023-04-02 NOTE — Discharge Instructions (Addendum)
 [facility]  Discharge Instructions  Lower Extremity Angiogram; Angioplasty/Stenting  Please refer to the following instructions for your post-procedure care. Your surgeon or physician assistant will discuss any changes with you.  Activity  Avoid lifting more than 8 pounds (1 gallons of milk) for 5 days after your procedure. You may walk as much as you can tolerate. It's OK to drive after 72 hours.  Bathing/Showering  You may shower the day after your procedure. If you have a bandage, you may remove it at 24- 48 hours. Clean your incision site with mild soap and water. Pat the area dry with a clean towel.  Diet  Resume your pre-procedure diet. There are no special food restrictions following this procedure. All patients with peripheral vascular disease should follow a low fat/low cholesterol diet. In order to heal from your surgery, it is CRITICAL to get adequate nutrition. Your body requires vitamins, minerals, and protein. Vegetables are the best source of vitamins and minerals. Vegetables also provide the perfect balance of protein. Processed food has little nutritional value, so try to avoid this.  Medications  Resume taking all of your medications unless your doctor tells you not to. If your incision is causing pain, you may take over-the-counter pain relievers such as acetaminophen (Tylenol)  Follow Up  Follow up will be arranged at the time of your procedure. You may have an office visit scheduled or may be scheduled for surgery. Ask your surgeon if you have any questions.  Please call us immediately for any of the following conditions: Severe or worsening pain your legs or feet at rest or with walking. Increased pain, redness, drainage at your groin puncture site. Fever of 101 degrees or higher. If you have any mild or slow bleeding from your puncture site: lie down, apply firm constant pressure over the area with a piece of gauze or a clean  wash cloth for 30 minutes- no peeking!, call 911 right away if you are still bleeding after 30 minutes, or if the bleeding is heavy and unmanageable.  Reduce your risk factors of vascular disease:  Stop smoking. If you would like help call [facility] at 1-800-QUIT-NOW (307-790-6895) or [place] at (616)529-9750. Manage your cholesterol Maintain a desired weight Control your diabetes Keep your blood pressure down  If you have any questions, please call the office at [phone]   Information on my medicine - ELIQUIS (apixaban)  This medication education was reviewed with me or my healthcare representative as part of my discharge preparation.  Why was Eliquis prescribed for you? Eliquis was prescribed for you to reduce the risk of a blood clot forming that can cause a stroke if you have a medical condition called atrial fibrillation (a type of irregular heartbeat).  What do You need to know about Eliquis ? Take your Eliquis TWICE DAILY - one tablet in the morning and one tablet in the evening with or without food. If you have difficulty swallowing the tablet whole please discuss with your pharmacist how to take the medication safely.  Take Eliquis exactly as prescribed by your doctor and DO NOT stop taking Eliquis without talking to the doctor who prescribed the medication.  Stopping may increase your risk of developing a stroke.  Refill your prescription before you run out.  After discharge, you should have regular check-up appointments with your healthcare provider that is prescribing your Eliquis.  In the future your dose may need to be changed if your kidney function or  weight changes by a significant amount or as you get older.  What do you do if you miss a dose? If you miss a dose, take it as soon as you remember on the same day and resume taking twice daily.  Do not take more than one dose of ELIQUIS at the same time to make up a missed dose.  Important Safety  Information A possible side effect of Eliquis is bleeding. You should call your healthcare provider right away if you experience any of the following: Bleeding from an injury or your nose that does not stop. Unusual colored urine (red or dark brown) or unusual colored stools (red or black). Unusual bruising for unknown reasons. A serious fall or if you hit your head (even if there is no bleeding).  Some medicines may interact with Eliquis and might increase your risk of bleeding or clotting while on Eliquis. To help avoid this, consult your healthcare provider or pharmacist prior to using any new prescription or non-prescription medications, including herbals, vitamins, non-steroidal anti-inflammatory drugs (NSAIDs) and supplements.  This website has more information on Eliquis (apixaban): http://www.eliquis.com/eliquis/home

## 2023-04-02 NOTE — Transfer of Care (Signed)
Immediate Anesthesia Transfer of Care Note  Patient: Harold [name]  Procedure(s) Performed: DEBRIDEMENT RIGHT ANKLE (Right: Ankle)  Patient Location: PACU  Anesthesia Type:General  Level of Consciousness: awake  Airway & Oxygen Therapy: Patient Spontanous Breathing  Post-op Assessment: Report given to RN  Post vital signs: stable  Last Vitals:  Vitals Value Taken Time  BP 154/70 04/02/23 1505  Temp    Pulse 57 04/02/23 1507  Resp 16 04/02/23 1507  SpO2 100 % 04/02/23 1507  Vitals shown include unfiled device data.  Last Pain:  Vitals:   04/02/23 1240  TempSrc: Oral  PainSc: 0-No pain      Patients Stated Pain Goal: 2 (04/02/23 0546)  Complications: No notable events documented.

## 2023-04-02 NOTE — Progress Notes (Signed)
Orthopedic Tech Progress Note Patient Details:  Harold [name] 1955-03-11 [id number]  Ortho Devices Type of Ortho Device: CAM walker Ortho Device/Splint Location: RLE Ortho Device/Splint Interventions: Ordered, Other (comment)gave PACU RN patient XL CAM WALKER BOOT, at this moment patient has on NIGHT SPLINT that was ordered on the 10th   Post Interventions Patient Tolerated: Other (comment) Instructions Provided: Care of device  [name] 04/02/2023, 3:40 PM

## 2023-04-02 NOTE — Progress Notes (Signed)
Pharmacy Antibiotic Note  BERNIS SCHREUR is a 68 y.o. 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  Patient with a history of HTN, HLD, CAD s/p PCI, A-fib on Eliquis, COPD, OSA, T2DM, CKD, PAD s/p stents.  -2/10: right ankle hardware removal, I&D and wound VAC placement  -2/11: stent of right SFA  -wound cultures with staph epi and GNR  Plans noted for debridement today  Plan: Continue Cefepime 2g q12hr; Flagyl per MD Continue Vancomycin 2000 mg q48hr (eAUC ~540; SCr 2.21)  Will follow renal function, cultures and clinical progress   Height: 6\' 1"  (185.4 cm) Weight: (!) 137.5 kg (303 lb 2.1 oz) IBW/kg (Calculated) : 79.9  Temp (24hrs), Avg:98.5 F (36.9 C), Min:98.2 F (36.8 C), Max:98.9 F (37.2 C)  Recent Labs  Lab 03/28/23 0447 03/29/23 0407 03/30/23 0501 03/31/23 0828 04/01/23 0529 04/02/23 0456  WBC 7.8 6.4 7.7  --  8.4 7.1  CREATININE 2.96* 2.61* 2.89* 2.58* 2.28* 2.21*    Estimated Creatinine Clearance: 45.9 mL/min (A) (by C-G formula based on SCr of 2.21 mg/dL (H)).    Allergies  Allergen Reactions   Doxycycline Hives and Rash   Septra [Bactrim] Itching   Penicillins Rash    Allergy to All cillin drugs  Ancef given 9/24 with no obvious reaction   Metformin And Related Diarrhea and Nausea Only   Other Nausea And Vomiting    General Anesthesia - sometimes causes nausea and vomiting * OK to use scopolamine patch     Sulfamethoxazole-Trimethoprim Other (See Comments)   Wellbutrin [Bupropion]     Mood Changes    Harland German, PharmD Clinical Pharmacist **Pharmacist phone directory can now be found on [email or web address] ([email or web address]).  Listed under [facility].  e

## 2023-04-02 NOTE — Anesthesia Procedure Notes (Signed)
Procedure Name: LMA Insertion Date/Time: 04/02/2023 2:19 PM  Performed by: Halina Andreas, CRNAPre-anesthesia Checklist: Patient identified, Emergency Drugs available, Suction available and Patient being monitored Patient Re-evaluated:Patient Re-evaluated prior to induction Oxygen Delivery Method: Circle System Utilized Preoxygenation: Pre-oxygenation with 100% oxygen Induction Type: IV induction Ventilation: Mask ventilation without difficulty LMA: LMA inserted LMA Size: 4.0 Number of attempts: 1 Airway Equipment and Method: Bite block Placement Confirmation: positive ETCO2 Tube secured with: Tape Dental Injury: Teeth and Oropharynx as per pre-operative assessment

## 2023-04-02 NOTE — Interval H&P Note (Signed)
History and Physical Interval Note:  04/02/2023 6:48 AM  Harold [name]  has presented today for surgery, with the diagnosis of Infected Right Ankle.  The various methods of treatment have been discussed with the patient and family. After consideration of risks, benefits and other options for treatment, the patient has consented to  Procedure(s): DEBRIDEMENT RIGHT ANKLE (Right) as a surgical intervention.  The patient's history has been reviewed, patient examined, no change in status, stable for surgery.  I have reviewed the patient's chart and labs.  Questions were answered to the patient's satisfaction.     Nadara Mustard

## 2023-04-02 NOTE — Consult Note (Signed)
[facility]    Date of Admission:  03/27/2023     Total days of antibiotics 8               Reason for Consult: Infected right ankle.  Referring Provider: Dr. Lajoyce Corners Primary Care Provider: [name], MD   ASSESSMENT:  [name] is a 68 year old male with type 2 diabetes complicated by neuropathy and recent ankle fracture status post fixation complicated by broken hardware and replacement presenting with cough, low-grade fever, and concern for postoperative infection and found to have influenza A and infected right ankle.  Status post vascular stenting and debridement.  Surgical specimens growing Staphylococcus epidermidis (MRSE) and Enterobacter Cloacae.  New surgical specimens from 04/02/23 with no organisms on gram stain and no growth on cultures. On broad-spectrum antibiotics with vancomycin, cefepime, and metronidazole.  Does have chronic kidney disease with creatinine of 2.21 which appears near baseline. Will change vancomycin to daptomycin secondary to renal status and continue with current dose of cefepime. Therapeutic drug monitoring of CK levels while on daptomycin. Discussed plan of care for antibiotics to treat infection following surgery with duration and route pending. Continue post-operative management per Orthopedics. Influenza symptoms resolved and was outside window for Oseltamivir and will need to continue with droplet precautions for now. Discussed importance of blood sugar management and protein intake to aid in healing. Remaining medical and supportive care per Internal Medicine.   PLAN:  Change vancomycin to Daptomycin. Discontinue metronidazole. Continue current dose of Cefepime.  Post-operative wound care per Orthopedics. Droplet precautions for Influenza Therapeutic drug monitoring of CK levels while on daptomycin.  Blood sugar control and optimize protein intake for healing.  Remaining medical and supportive care per Internal  Medicine.    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, right (HCC)   Abscess of skin of right ankle   PAD (peripheral artery disease) (HCC)   Cellulitis and abscess of foot    allopurinol  100 mg Oral Daily   amiodarone  200 mg Oral BID   apixaban  5 mg Oral BID   aspirin EC  81 mg Oral Daily   carvedilol  3.125 mg Oral BID WC   clopidogrel  75 mg Oral Q breakfast   docusate sodium  100 mg Oral BID   DULoxetine  60 mg Oral Daily   ezetimibe  10 mg Oral Daily   furosemide  40 mg Oral BID   gabapentin  400 mg Oral TID   guaiFENesin  600 mg Oral BID   insulin aspart  0-9 Units Subcutaneous Q6H   insulin glargine-yfgn  30 Units Subcutaneous Daily   levothyroxine  88 mcg Oral QAC breakfast   rosuvastatin  20 mg Oral QPM   sodium chloride flush  3 mL Intravenous Q12H   sodium chloride flush  3-10 mL Intravenous Q12H     HPI: Harold [name] is a 68 y.o. male with previous medical history of coronary artery disease, chronic venous insufficiency, COPD, type 2 diabetes complicated by neuropathy, hypertension, and obstructive sleep apnea presenting to the hospital via EMS with 3-day history of cough, low-grade fever, and postsurgical wound on the left  foot that was not responding to p.o. antibiotics.  [name] initially underwent open reduction internal fixation of left trimalleolar ankle fracture with pinning of the tibiotalar and subtalar joints and open reduction internal fixation of the right ankle syndesmosis on 01/02/2023 following a fall from bed.  Course was complicated by the findings of broken pins at his first follow-up with only 1 being retrievable 3 weeks he will.  Return to the  OR on 01/28/2023 for removal of broken implants.  Has received multiple courses of antibiotics following his surgeries including cefadroxil 500 mg twice daily for 7 days on 02/22/2022; and cefadroxil on 03/12/2023 for 14 days.    [name] was initially afebrile and with no leukocytosis.  Positive for influenza A.  Chest x-ray with no cardiopulmonary disease. X-rays of the tibia/fibula and ankle with no new signs of infection.  CT ankle with moderate callus formation along the fibular fracture lines with minimal callus along the medial malleoli are fracture lines and persistent distraction at the posterior malleolar fracture site with no significant callus formation.  Underwent abdominal aortogram with lower extremity on 04/01/2023 with stenting of the right superficial femoral artery and is now status post excisional debridement of the right ankle and soft tissue and partial excision of the lateral border of the fibula.  Has remained afebrile with no leukocytosis.  Antibiotic regimen currently vancomycin, cefepime, and metronidazole.  Staph aureus PCR negative.  Surgical specimens from 03/31/23 with rare staph epidermidis and gram-negative rods with identification to follow.  Surgical specimens from 04/02/2023 are pending.  Most recent lab work on 04/02/2023 with creatinine of 2.21 and estimated GFR of 31.  Review of Systems: Review of Systems  Constitutional:  Negative for chills, fever and weight loss.  Respiratory:  Negative for cough, shortness of breath and wheezing.   Cardiovascular:  Negative for chest pain and leg swelling.  Gastrointestinal:  Negative for abdominal pain, constipation, diarrhea, nausea and vomiting.  Skin:  Negative for rash.     Past Medical History:  Diagnosis Date   Anemia    Anginal pain (HCC) 04/2013   Cardiac cath showed patent stents with distal LAD, circumflex-OM and RCA disease in small vessels.   Anxiety    Atrial fibrillation (HCC) 12/2021   CAD S/P  percutaneous coronary angioplasty 2003, 04/2012   status post PCI to LAD, circumflex-OM 2, RCA   Carotid artery occlusion    Chronic renal insufficiency, stage II (mild)    Chronic venous insufficiency    varicosities, no reflux; dopplers 04/14/12- valvular insufficiency in the R and L GSV   Complication of anesthesia    COPD, mild (HCC)    Depression    situaltional    Diabetes mellitus    Diabetic neuropathy (HCC) 08/24/2019   Dyslipidemia associated with type 2 diabetes mellitus (HCC)    GERD (gastroesophageal reflux disease)    History of cardioversion 12/2021   at [facility]   HTN (hypertension)    Hypothyroidism    Neuromuscular disorder (HCC)    neuropathy in feet   Neuropathy    notably improved following PCI with improved cardiac function   Obesity    Obesity, Class II, BMI 35.0-39.9, with comorbidity (see actual BMI)    BMI 39; wgt loss efforts in place; seeing Dietician   Orthostatic hypotension    PONV (postoperative nausea and vomiting)    "[employer]"   Pulmonary hypertension (HCC) 04/2012   PA pressure   Sleep apnea  uses nightly   Vitamin D deficiency    per facility notes    Social History   Tobacco Use   Smoking status: Former    Current packs/day: 0.00    Average packs/day: 1 pack/day for 42.0 years (42.0 ttl pk-yrs)    Types: Cigarettes    Start date: 03/22/1961    Quit date: 03/23/2003    Years since quitting: 20.0   Smokeless tobacco: Never   Tobacco comments:    Former smoker 02/19/22  Vaping Use   Vaping status: Never Used  Substance Use Topics   Alcohol use: No    Alcohol/week: 0.0 standard drinks of alcohol   Drug use: No    Family History  Adopted: Yes  Family history unknown: Yes    Allergies  Allergen Reactions   Doxycycline Hives and Rash   Septra [Bactrim] Itching   Penicillins Rash    Allergy to All cillin drugs  Ancef given 9/24 with no obvious reaction   Metformin And Related Diarrhea and Nausea Only   Other  Nausea And Vomiting    General Anesthesia - sometimes causes nausea and vomiting * OK to use scopolamine patch     Sulfamethoxazole-Trimethoprim Other (See Comments)   Wellbutrin [Bupropion]     Mood Changes    OBJECTIVE: Blood pressure (!) 182/77, pulse 64, temperature 98.9 F (37.2 C), temperature source Axillary, resp. rate 17, height 6\' 1"  (1.854 m), weight (!) 137.9 kg, SpO2 97%.  Physical Exam Constitutional:      General: He is not in acute distress.    Appearance: He is well-developed.     Comments: Lying in bed with head of bed elevated; pleasant.   Cardiovascular:     Rate and Rhythm: Normal rate and regular rhythm.     Heart sounds: Normal heart sounds.  Pulmonary:     Effort: Pulmonary effort is normal.     Breath sounds: Normal breath sounds.  Musculoskeletal:     Comments: Wound vac in place.   Skin:    General: Skin is warm and dry.  Neurological:     Mental Status: He is alert and oriented to person, place, and time.  Psychiatric:        Mood and Affect: Mood normal.     Lab Results Lab Results  Component Value Date   WBC 8.4 04/03/2023   HGB 10.6 (L) 04/03/2023   HCT 31.1 (L) 04/03/2023   MCV 85.7 04/03/2023   PLT 230 04/03/2023    Lab Results  Component Value Date   CREATININE 2.24 (H) 04/03/2023   BUN 18 04/03/2023   NA 140 04/03/2023   K 3.6 04/03/2023   CL 107 04/03/2023   CO2 21 (L) 04/03/2023    Lab Results  Component Value Date   ALT 9 03/28/2023   AST 11 (L) 03/28/2023   GGT 28 02/20/2018   ALKPHOS 63 03/28/2023   BILITOT 0.7 03/28/2023     Microbiology: Recent Results (from the past 240 hours)  Resp panel by RT-PCR (RSV, Flu A&B, Covid) Anterior Nasal Swab     Status: Abnormal   Collection Time: 03/27/23  6:33 PM   Specimen: Anterior Nasal Swab  Result Value Ref Range Status   SARS Coronavirus 2 by RT PCR NEGATIVE NEGATIVE Final   Influenza A by PCR POSITIVE (A) NEGATIVE Final   Influenza B by PCR NEGATIVE NEGATIVE Final     Comment: (NOTE) The Xpert Xpress SARS-CoV-2/FLU/RSV plus assay is intended as an aid in  the diagnosis of influenza from Nasopharyngeal swab specimens and should not be used as a sole basis for treatment. Nasal washings and aspirates are unacceptable for Xpert Xpress SARS-CoV-2/FLU/RSV testing.  Fact Sheet for Patients: BloggerCourse.com  Fact Sheet for Healthcare Providers: SeriousBroker.it  This test is not yet approved or cleared by the [place] FDA and has been authorized for detection and/or diagnosis of SARS-CoV-2 by FDA under an Emergency Use Authorization (EUA). This EUA will remain in effect (meaning this test can be used) for the duration of the COVID-19 declaration under Section 564(b)(1) of the Act, 21 U.S.C. section 360bbb-3(b)(1), unless the authorization is terminated or revoked.     Resp Syncytial Virus by PCR NEGATIVE NEGATIVE Final    Comment: (NOTE) Fact Sheet for Patients: BloggerCourse.com  Fact Sheet for Healthcare Providers: SeriousBroker.it  This test is not yet approved or cleared by the [place] FDA and has been authorized for detection and/or diagnosis of SARS-CoV-2 by FDA under an Emergency Use Authorization (EUA). This EUA will remain in effect (meaning this test can be used) for the duration of the COVID-19 declaration under Section 564(b)(1) of the Act, 21 U.S.C. section 360bbb-3(b)(1), unless the authorization is terminated or revoked.  Performed at [facility], [address] [place], [place] [address]   Respiratory (~20 pathogens) panel by PCR     Status: Abnormal   Collection Time: 03/28/23  3:15 PM   Specimen: Nasopharyngeal Swab; Respiratory  Result Value Ref Range Status   Adenovirus NOT DETECTED NOT DETECTED Final   Coronavirus 229E NOT DETECTED NOT DETECTED Final    Comment: (NOTE) The Coronavirus on the Respiratory  Panel, DOES NOT test for the novel  Coronavirus (2019 nCoV)    Coronavirus HKU1 NOT DETECTED NOT DETECTED Final   Coronavirus NL63 NOT DETECTED NOT DETECTED Final   Coronavirus OC43 NOT DETECTED NOT DETECTED Final   Metapneumovirus NOT DETECTED NOT DETECTED Final   Rhinovirus / Enterovirus NOT DETECTED NOT DETECTED Final   Influenza A H3 DETECTED (A) NOT DETECTED Final   Influenza B NOT DETECTED NOT DETECTED Final   Parainfluenza Virus 1 NOT DETECTED NOT DETECTED Final   Parainfluenza Virus 2 NOT DETECTED NOT DETECTED Final   Parainfluenza Virus 3 NOT DETECTED NOT DETECTED Final   Parainfluenza Virus 4 NOT DETECTED NOT DETECTED Final   Respiratory Syncytial Virus NOT DETECTED NOT DETECTED Final   Bordetella pertussis NOT DETECTED NOT DETECTED Final   Bordetella Parapertussis NOT DETECTED NOT DETECTED Final   Chlamydophila pneumoniae NOT DETECTED NOT DETECTED Final   Mycoplasma pneumoniae NOT DETECTED NOT DETECTED Final    Comment: Performed at [facility], [address], [place], [place] [address]  Surgical pcr screen     Status: None   Collection Time: 03/31/23 10:15 AM   Specimen: Nasal Mucosa; Nasal Swab  Result Value Ref Range Status   MRSA, PCR NEGATIVE NEGATIVE Final   Staphylococcus aureus NEGATIVE NEGATIVE Final    Comment: (NOTE) The Xpert SA Assay (FDA approved for NASAL specimens in patients 44 years of age and older), is one component of a comprehensive surveillance program. It is not intended to diagnose infection nor to guide or monitor treatment. Performed at [facility], [address] [place], [place] [address]   Aerobic/Anaerobic Culture w Gram Stain (surgical/deep wound)     Status: None (Preliminary result)   Collection Time: 03/31/23 11:54 AM   Specimen: Path fluid; Tissue  Result Value Ref Range Status  Specimen Description WOUND RIGHT ANKLE  Final   Special Requests NONE  Final   Gram Stain   Final    RARE WBC PRESENT, PREDOMINANTLY  PMN NO ORGANISMS SEEN Performed at [facility], [address], [place], [place] [address]    Culture   Final    RARE STAPHYLOCOCCUS EPIDERMIDIS RARE ENTEROBACTER CLOACAE NO ANAEROBES ISOLATED; CULTURE IN PROGRESS FOR 5 DAYS    Report Status PENDING  Incomplete   Organism ID, Bacteria STAPHYLOCOCCUS EPIDERMIDIS  Final   Organism ID, Bacteria ENTEROBACTER CLOACAE  Final      Susceptibility   Enterobacter cloacae - MIC*    CEFEPIME <=0.12 SENSITIVE Sensitive     CEFTAZIDIME <=1 SENSITIVE Sensitive     CIPROFLOXACIN <=0.25 SENSITIVE Sensitive     GENTAMICIN <=1 SENSITIVE Sensitive     IMIPENEM 1 SENSITIVE Sensitive     TRIMETH/SULFA <=20 SENSITIVE Sensitive     PIP/TAZO <=4 SENSITIVE Sensitive ug/mL    * RARE ENTEROBACTER CLOACAE   Staphylococcus epidermidis - MIC*    CIPROFLOXACIN <=0.5 SENSITIVE Sensitive     ERYTHROMYCIN >=8 RESISTANT Resistant     GENTAMICIN <=0.5 SENSITIVE Sensitive     OXACILLIN >=4 RESISTANT Resistant     TETRACYCLINE >=16 RESISTANT Resistant     VANCOMYCIN 2 SENSITIVE Sensitive     TRIMETH/SULFA 160 RESISTANT Resistant     CLINDAMYCIN RESISTANT Resistant     RIFAMPIN <=0.5 SENSITIVE Sensitive     Inducible Clindamycin POSITIVE Resistant     * RARE STAPHYLOCOCCUS EPIDERMIDIS  Aerobic/Anaerobic Culture w Gram Stain (surgical/deep wound)     Status: None (Preliminary result)   Collection Time: 04/02/23  2:27 PM   Specimen: Wound; Tissue  Result Value Ref Range Status   Specimen Description WOUND RIGHT ANKLE  Final   Special Requests NONE  Final   Gram Stain NO WBC SEEN NO ORGANISMS SEEN   Final   Culture   Final    NO GROWTH < 24 HOURS Performed at [facility], [address] [place], [place] [address]    Report Status PENDING  Incomplete     Marcos Eke, NP [facility]  04/03/2023  12:54 PM

## 2023-04-02 NOTE — Op Note (Signed)
04/02/2023  3:05 PM  PATIENT:  Harold [name]    PRE-OPERATIVE DIAGNOSIS:  Infected Right Ankle  POST-OPERATIVE DIAGNOSIS:  Same  PROCEDURE: Excisional DEBRIDEMENT RIGHT ankle with excision skin, soft tissue and partial excision of the lateral border of the fibula. Tissue sent for cultures. Local tissue transfer for wound closure 15 x 4 cm. Application Kerecis micro graft 95 cm. Application of customizable wound VAC.  SURGEON:  Nadara Mustard, MD  PHYSICIAN ASSISTANT: Youlanda Roys ANESTHESIA:   General  PREOPERATIVE INDICATIONS:  [name] is a  68 y.o. male with a diagnosis of Infected Right Ankle who failed conservative measures and elected for surgical management.    The risks benefits and alternatives were discussed with the patient preoperatively including but not limited to the risks of infection, bleeding, nerve injury, cardiopulmonary complications, the need for revision surgery, among others, and the patient was willing to proceed.  OPERATIVE IMPLANTS:   Implant Name Type Inv. Item Serial No. Manufacturer Lot No. LRB No. Used Action  GRAFT SKIN WND SURGICLOSE M95 - [id number] Tissue GRAFT SKIN WND SURGICLOSE M95  [employer] [id number] Right 1 Implanted    @ENCIMAGES @  OPERATIVE FINDINGS: No purulence.  The tissue margins were healthy and viable.  Tissue sent for cultures.  OPERATIVE PROCEDURE: Patient brought the operating room underwent a general anesthetic.  After adequate levels anesthesia were obtained the right lower extremity was prepped using DuraPrep draped into a sterile field a timeout was called.  A 21 blade knife was used to ellipse around the wound this left a wound that was 15 x 4 cm.  The muscle had good color and contractility.  Fascia skin soft tissue muscle and bone was sent for tissue cultures.  A periosteal elevator was used to debride the lateral border of the fibula for partial fibula excision and this tissue was sent for cultures.   Tissue margins were undermined to allow for local tissue transfer for wound closure.  The wound was irrigated with Vashe.  The fibula was out to length.  Kerecis micro graft was applied to the wound bed especially of the large defect distally.  Local tissue rearrangement was used for wound closure 15 x 4 cm.  The wound was covered with a customizable wound VAC this was secured with derma tack covered with Ioban and Coban this had a good suction fit patient was extubated taken to PACU in stable condition.   DISCHARGE PLANNING:  Antibiotic duration: Continue Kefzol and adjusted presenting to tissue cultures.  Anticipate patient will need 6 weeks of IV antibiotics due to the exposed fibula.  Weightbearing: Touchdown weightbearing in a fracture boot  Pain medication: Opioid pathway  Dressing care/ Wound VAC: Negative pressure wound VAC  Ambulatory devices: Walker  Discharge to: Discharge planning based on therapy recommendations.  Follow-up: In the office 1 week post operative.

## 2023-04-02 NOTE — Progress Notes (Signed)
PROGRESS NOTE  Harold [name]  DOB: 1955-06-03  PCP: [name], MD WUJ:[id number]  DOA: 03/27/2023  LOS: 6 days  Hospital Day: 7  Brief narrative: Harold [name] is a 68 y.o. male with PMH significant for morbid obesity, OSA, DM2, HTN, HLD, CAD/stent, A-fib on Eliquis, carotid artery stenosis, CKD, chronic anemia, anxiety/depression, diabetic neuropathy, GERD, hypothyroidism, COPD, pulmonary hypertension. 2/6, patient presented to the ED with complaint of cough, subjective fever, myalgia, generalized weakness for 5 days. Respiratory virus panel positive for influenza A. Chest x-ray negative for infiltrates. Out of for Tamiflu.  He improved from fluid with supportive care.  On admission, he was also noted to have right leg cellulitis. Per chart review, in November 2024, he underwent ORIF of the right ankle, with ensuing revision in December 2024.  He has subsequently been followed by wound care at home.  However over the course the last 1 week, patient had progressive right lateral malleolus erythema, swelling, tenderness, with some evidence of purulent drainage, X-ray showed soft tissue swelling most pronounced over the lateral malleolus, consistent with cellulitis, without evidence of subcutaneous gas or crepitus to suggest necrotizing fasciitis.  Orthopedics and vascular surgery consult appreciated. Procedures as below.  Subjective: Patient was seen and examined this morning.  Propped up in bed.  Not in distress.  No new symptoms.  N.p.o. this morning for the plan of further debridement of right ankle by Dr. Lajoyce Corners  Assessment and plan: Right leg wound infection Prior right ankle surgery (12/2022) complicated by postsurgical wound infection. S/p right ankle hardware removal, I&D and wound VAC placement -2/10 Dr. Jena Gauss In the setting of uncontrolled diabetes, and possible PAD. Further debridement planned by Dr. Lajoyce Corners today Currently continued on broad-spectrum antibiotic  coverage.  PAD Seen by vascular surgery. 2/11, underwent aortogram, right leg angiogram, successful stenting of right superficial femoral artery. Eliquis and Plavix to resume after procedure today  Acute diarrhea 2/10, patient had 8 loose bowel movements overnight.  Unclear trigger.  Seems to have stopped in last 48 hours.  Influenza A Presented with 5 days of cough, fever, myalgia Respiratory virus panel positive for influenza A Chest x-ray without acute infiltrate No fever, no WBC count Was outside the 48-hour window for Tamiflu and hence was not initiated Currently on supportive care with scheduled Mucinex, as needed Tessalon Perles, incentive spirometry Respiratory status stable. Recent Labs  Lab 03/28/23 0447 03/29/23 0407 03/30/23 0501 04/01/23 0529 04/02/23 0456  WBC 7.8 6.4 7.7 8.4 7.1  PROCALCITON 0.31  --   --   --   --    Type 2 diabetes mellitus  Diabetic neuropathy A1c 7 on 01/02/2023 PTA meds-Tresiba 60 units daily, Mounjaro Currently on Lantus 30 units daily and SSI/Accu-Cheks.  Blood sugar level downtrending.  Remains n.p.o. this morning.  Okay to hold Lantus this morning.  Continue to monitor blood sugar trend. PTA on gabapentin 1600 mg 3 times daily???.  Seems very high-dose.  Unable to confirm from the patient Currently on reduced dose of gabapentin at 400 mg 3 times daily. Recent Labs  Lab 04/01/23 1405 04/01/23 1643 04/02/23 0045 04/02/23 0655 04/02/23 1212  GLUCAP 116* 113* 120* 136* 133*   AKI on CKD 4 Baseline creatinine 2.13 from December 2024.  Creatinine worsened to peak at 2.89 on 2/9, gradually improving now.  Lasix on hold. Continue to monitor for now. Follows up with [facility] as an outpatient Recent Labs    01/11/23 0528 01/12/23 [phone] 01/28/23 0906 03/27/23 1832 03/28/23  [id number] 03/29/23 0407 03/30/23 0501 03/31/23 0828 04/01/23 0529 04/02/23 0456  BUN 31* 32* 18 22 23  25* 27* 26* 22 20  CREATININE 2.48* 2.54* 2.13*  2.73* 2.96* 2.61* 2.89* 2.58* 2.28* 2.21*   Paroxysmal A-fib PTA meds- Coreg, amiodarone Currently continued on both Chronically anticoagulated with Eliquis.  Currently on hold in preparation of surgery Patient is on normal sinus rhythm and hence not started on any bridging while off Eliquis  Essential hypertension PTA meds- Coreg 3.125 mg twice daily, Lasix 160 mg daily Currently continued on Coreg 3.125 mg twice daily.  Currently Lasix on hold. Blood pressure rising up, creatinine improving.  Patient seems to be developing pedal edema as well. I have ordered Lasix 40 mg oral twice daily to start from tomorrow, did not restart today because of pending procedure today IV hydralazine as needed  CAD/stent Carotid artery stenosis HLD PTA meds -Eliquis, Plavix, Crestor Continue Crestor.  Eliquis and Plavix on hold for procedures  Hypothyroidism Continue Synthroid  Morbid Obesity  Body mass index is 39.99 kg/m. Patient has been advised to make an attempt to improve diet and exercise patterns to aid in weight loss.  Obstructive sleep apnea Patient reported good compliance on home nocturnal CPAP  Gout Allopurinol, colchicine  Depression Cymbalta   Mobility: Needs PT eval postprocedure  Goals of care   Code Status: Full Code     DVT prophylaxis:  SCDs Start: 03/31/23 1620 SCDs Start: 03/27/23 2243   Antimicrobials: Broad-spectrum IV antibiotics Fluid: None Consultants: Orthopedics, vascular surgery Family Communication: None at bedside  Status: Inpatient Level of care:  Progressive Cardiac   Patient is from: Home Needs to continue in-hospital care: Pending clinical course, pending repeat debridement Anticipated d/c to: Pending clinical course    Diet:  Diet Order             Diet NPO time specified Except for: Sips with Meds  Diet effective midnight                   Scheduled Meds:  [MAR Hold] allopurinol  100 mg Oral Daily   [MAR Hold]  amiodarone  200 mg Oral BID   [MAR Hold] aspirin EC  81 mg Oral Daily   [MAR Hold] carvedilol  3.125 mg Oral BID WC    ceFAZolin (ANCEF) IV  3 g Intravenous On Call to OR   [MAR Hold] clopidogrel  75 mg Oral Q breakfast   [MAR Hold] docusate sodium  100 mg Oral BID   [MAR Hold] DULoxetine  60 mg Oral Daily   [MAR Hold] ezetimibe  10 mg Oral Daily   [MAR Hold] furosemide  40 mg Oral BID   [MAR Hold] gabapentin  400 mg Oral TID   [MAR Hold] guaiFENesin  600 mg Oral BID   [MAR Hold] insulin aspart  0-9 Units Subcutaneous Q6H   [MAR Hold] insulin glargine-yfgn  30 Units Subcutaneous Daily   [MAR Hold] levothyroxine  88 mcg Oral QAC breakfast   [MAR Hold] metroNIDAZOLE  500 mg Oral Q12H   [MAR Hold] rosuvastatin  20 mg Oral QPM   [MAR Hold] sodium chloride flush  3 mL Intravenous Q12H    PRN meds: [MAR Hold] sodium chloride, [MAR Hold] acetaminophen **OR** [MAR Hold] acetaminophen, [MAR Hold] benzonatate, [MAR Hold] hydrALAZINE, [MAR Hold]  HYDROmorphone (DILAUDID) injection, [MAR Hold] melatonin, [MAR Hold] methocarbamol **OR** [MAR Hold] methocarbamol (ROBAXIN) injection, [MAR Hold] metoCLOPramide **OR** [MAR Hold] metoCLOPramide (REGLAN) injection, [MAR Hold] naLOXone (NARCAN)  injection, [facility]  Hold] ondansetron (ZOFRAN) IV, [MAR Hold] oxyCODONE, [MAR Hold] polyethylene glycol, [MAR Hold] sodium chloride flush   Infusions:   [MAR Hold] sodium chloride     [MAR Hold] ceFEPime (MAXIPIME) IV 2 g (04/02/23 1119)   [MAR Hold] vancomycin Stopped (04/01/23 0015)    Antimicrobials: Anti-infectives (From admission, onward)    Start     Dose/Rate Route Frequency Ordered Stop   04/02/23 0600  ceFAZolin (ANCEF) IVPB 3g/150 mL premix        3 g 300 mL/hr over 30 Minutes Intravenous On call to O.R. 04/01/23 1700 04/03/23 0559   03/31/23 1015  ceFAZolin (ANCEF) IVPB 2g/100 mL premix        2 g 200 mL/hr over 30 Minutes Intravenous On call to O.R. 03/31/23 0921 03/31/23 1203   03/29/23 2200  [MAR  Hold]  vancomycin (VANCOREADY) IVPB 2000 mg/400 mL        (MAR Hold since Wed 04/02/2023 at 1231.Hold Reason: Transfer to a Procedural area)  Placed in "Followed by" Linked Group   2,000 mg 200 mL/hr over 120 Minutes Intravenous Every 48 hours 03/27/23 2257     03/28/23 1045  [MAR Hold]  metroNIDAZOLE (FLAGYL) tablet 500 mg        (MAR Hold since Wed 04/02/2023 at 1231.Hold Reason: Transfer to a Procedural area)   500 mg Oral Every 12 hours 03/28/23 0955     03/27/23 2330  vancomycin (VANCOCIN) 2,500 mg in sodium chloride 0.9 % 500 mL IVPB       Placed in "Followed by" Linked Group   2,500 mg 262.5 mL/hr over 120 Minutes Intravenous  Once 03/27/23 2257 03/28/23 0241   03/27/23 2300  metroNIDAZOLE (FLAGYL) IVPB 500 mg  Status:  Discontinued        500 mg 100 mL/hr over 60 Minutes Intravenous Every 12 hours 03/27/23 2247 03/28/23 0955   03/27/23 2300  [MAR Hold]  ceFEPIme (MAXIPIME) 2 g in sodium chloride 0.9 % 100 mL IVPB        (MAR Hold since Wed 04/02/2023 at 1231.Hold Reason: Transfer to a Procedural area)   2 g 200 mL/hr over 30 Minutes Intravenous Every 12 hours 03/27/23 2257     03/27/23 2100  ceFAZolin (ANCEF) IVPB 2g/100 mL premix        2 g 200 mL/hr over 30 Minutes Intravenous  Once 03/27/23 2058 03/27/23 2219       Objective: Vitals:   04/02/23 1015 04/02/23 1240  BP: (!) 169/63 (!) 162/64  Pulse: 61 60  Resp:  18  Temp:  97.8 F (36.6 C)  SpO2:  97%    Intake/Output Summary (Last 24 hours) at 04/02/2023 1312 Last data filed at 04/02/2023 1129 Gross per 24 hour  Intake 3346.22 ml  Output 1200 ml  Net 2146.22 ml   Filed Weights   03/31/23 1002 04/01/23 1331 04/02/23 0415  Weight: (!) 142.9 kg (!) 143.3 kg (!) 137.5 kg   Weight change: 0.417 kg Body mass index is 39.99 kg/m.   Physical Exam: General exam: Pleasant, elderly Caucasian male. Skin: No rashes, lesions or ulcers. HEENT: Atraumatic, normocephalic, no obvious bleeding Lungs: Clear to auscultation  bilaterally, on CPAP CVS: S1, S2, no murmur,   GI/Abd: Soft, nontender, nondistended, bowel sound present,   CNS: Alert, awake, oriented to place and person. Psychiatry: Mood appropriate,  Extremities: Trace bilateral pedal edema, no calf tenderness, right foot has wound VAC on.  Data Review: I have personally reviewed the laboratory data and studies  available.  F/u labs ordered Unresulted Labs (From admission, onward)     Start     Ordered   04/01/23 0500  Basic metabolic panel  Daily,   R      03/31/23 0813   04/01/23 0500  CBC with Differential/Platelet  Daily,   R      03/31/23 0813   04/01/23 0500  CBC  Daily,   R      03/31/23 1619   03/31/23 0815  Gastrointestinal Panel by PCR , Stool  (Gastrointestinal Panel by PCR, Stool                                                                                                                                                     **Does Not include CLOSTRIDIUM DIFFICILE testing. **If CDIFF testing is needed, place order from the "C Difficile Testing" order set.**)  Once,   R        03/31/23 0814           Total time spent in review of labs and imaging, patient evaluation, formulation of plan, documentation and communication with family: 45 minutes  Signed, Lorin Glass, MD [facility] 04/02/2023

## 2023-04-02 NOTE — Anesthesia Preprocedure Evaluation (Addendum)
Anesthesia Evaluation  Patient identified by MRN, date of birth, ID band Patient awake    Reviewed: Allergy & Precautions, H&P , NPO status , Patient's Chart, lab work & pertinent test results, reviewed documented beta blocker date and time   History of Anesthesia Complications (+) PONV and history of anesthetic complications  Airway Mallampati: IV  TM Distance: >3 FB Neck ROM: Full    Dental  (+) Teeth Intact, Dental Advisory Given   Pulmonary sleep apnea , COPD, former smoker Quit smoking 2005, 42 pack year history    Pulmonary exam normal breath sounds clear to auscultation       Cardiovascular hypertension, Pt. on medications + CAD and + Peripheral Vascular Disease  Normal cardiovascular exam+ dysrhythmias Atrial Fibrillation  Rhythm:Regular Rate:Normal  Echo 2024 1. EF challenging to assess with afib/ challenging windows. Left  ventricular ejection fraction, by estimation, is 50 to 55%. The left  ventricle has low normal function. The left ventricle demonstrates global  hypokinesis. There is mild concentric left  ventricular hypertrophy. Left ventricular diastolic parameters are  indeterminate.   2. Right ventricular systolic function is normal. The right ventricular  size is normal.   3. Left atrial size was mildly dilated.   4. Trivial mitral valve regurgitation.   5. There is mild calcification of the aortic valve. Aortic valve  regurgitation is not visualized.   6. There is mild dilatation of the ascending aorta, measuring 36 mm.   7. The inferior vena cava is normal in size with greater than 50%  respiratory variability, suggesting right atrial pressure of 3 mmHg.      Neuro/Psych   Anxiety Depression    negative neurological ROS  negative psych ROS   GI/Hepatic Neg liver ROS,GERD  Controlled and Medicated,,  Endo/Other  diabetes, Well Controlled, Type 1, Insulin Dependent  BMI 42  Renal/GU CRF and Renal  InsufficiencyRenal diseaseCr 2.89  negative genitourinary   Musculoskeletal negative musculoskeletal ROS (+)    Abdominal  (+) + obese  Peds negative pediatric ROS (+)  Hematology  (+) Blood dyscrasia, anemia Hb 11.3, plt 192   Anesthesia Other Findings   Reproductive/Obstetrics negative OB ROS                             Anesthesia Physical Anesthesia Plan  ASA: 4  Anesthesia Plan: General   Post-op Pain Management:    Induction: Intravenous  PONV Risk Score and Plan: 3 and Ondansetron, Dexamethasone, Treatment may vary due to age or medical condition and Midazolam  Airway Management Planned: LMA  Additional Equipment: None  Intra-op Plan:   Post-operative Plan: Extubation in OR  Informed Consent: I have reviewed the patients History and Physical, chart, labs and discussed the procedure including the risks, benefits and alternatives for the proposed anesthesia with the patient or authorized representative who has indicated his/her understanding and acceptance.     Dental advisory given  Plan Discussed with: CRNA  Anesthesia Plan Comments:         Anesthesia Quick Evaluation

## 2023-04-02 NOTE — Progress Notes (Signed)
  Progress Note    04/02/2023 8:38 AM 1 Day Post-Op  Subjective:  no complaints   Vitals:   04/02/23 0415 04/02/23 0723  BP: (!) 171/66 (!) 199/75  Pulse: 61   Resp: 19 17  Temp: 98.9 F (37.2 C) 98.3 F (36.8 C)  SpO2: 98%    Physical Exam: Cardiac:  regular Lungs:  non labored Extremities:  left CF access site without swelling or hematoma. RLE well perfused and warm. Right foot dressed so unable to get distal pulses Neurologic: alert and oriented  CBC    Component Value Date/Time   WBC 7.1 04/02/2023 0456   RBC 3.68 (L) 04/02/2023 0456   HGB 10.6 (L) 04/02/2023 0456   HGB 14.6 11/11/2022 1522   HCT 31.5 (L) 04/02/2023 0456   HCT 43.8 11/11/2022 1522   PLT 210 04/02/2023 0456   PLT 169 10/29/2022 1203   MCV 85.6 04/02/2023 0456   MCV 96 10/29/2022 1203   MCH 28.8 04/02/2023 0456   MCHC 33.7 04/02/2023 0456   RDW 13.8 04/02/2023 0456   RDW 14.5 10/29/2022 1203   LYMPHSABS 1.1 04/02/2023 0456   LYMPHSABS 1.6 05/12/2018 1150   MONOABS 0.6 04/02/2023 0456   EOSABS 0.4 04/02/2023 0456   EOSABS 0.2 05/12/2018 1150   BASOSABS 0.1 04/02/2023 0456   BASOSABS 0.1 05/12/2018 1150    BMET    Component Value Date/Time   NA 138 04/02/2023 0456   NA 141 11/14/2022 1624   K 3.5 04/02/2023 0456   CL 105 04/02/2023 0456   CO2 20 (L) 04/02/2023 0456   GLUCOSE 139 (H) 04/02/2023 0456   BUN 20 04/02/2023 0456   BUN 23 11/14/2022 1624   CREATININE 2.21 (H) 04/02/2023 0456   CREATININE 1.51 (H) 10/16/2015 1053   CALCIUM 9.0 04/02/2023 0456   GFRNONAA 31 (L) 04/02/2023 0456   GFRNONAA 49 (L) 10/16/2015 1053   GFRAA 47 (L) 09/28/2019 1242   GFRAA 56 (L) 10/16/2015 1053    INR    Component Value Date/Time   INR 1.3 (H) 03/28/2023 0447     Intake/Output Summary (Last 24 hours) at 04/02/2023 5784 Last data filed at 04/02/2023 6962 Gross per 24 hour  Intake 3346.22 ml  Output 900 ml  Net 2446.22 ml     Assessment/Plan:  68 y.o. male is s/p Abdominal aortogram  with CO2, RLE arteriogram, stent of right SFA 1 Day Post-Op   RLE maximally revascularized Left groin access site intact, no swelling or hematoma RLE wounds dressed. Going to OR today with Dr. Lajoyce Corners for debridement Should have adequate perfusion for wound healing, should there be any difficulty could attempt AT intervention in the future Continue Aspirin, Statin, Plavix He will have follow up arranged in 4 weeks with RLE arterial duplex and ABI  Graceann Congress, PA-C [facility] [phone] 04/02/2023 8:38 AM

## 2023-04-02 NOTE — TOC Progression Note (Addendum)
Transition of Care [facility]) - Progression Note    Patient Details  Name: Harold [name] MRN: [id number] Date of Birth: 1955/12/28  [facility] of [facility]) CM/SW Contact  Delilah Shan, LCSWA Phone Number: 04/02/2023, 4:29 PM  Clinical Narrative:     CSW spoke with patient and provided SNF bed offers. Patient requested that CSW call his spouse Harold [name] to help with SNF choice. CSW spoke with patients spouse Harold [name] and provided SNF bed offers. Harold [name] request to review and discuss with patient and will follow up with CSW tomorrow with SNF choice. Insurance authorization started auth# [id number]. CSW will continue to follow and assist with patients dc planning needs.  Expected Discharge Plan: Skilled Nursing Facility Barriers to Discharge: Continued Medical Work up  Expected Discharge Plan and Services In-house Referral: Clinical Social Work     Living arrangements for the past 2 months: Single Family Home                                       Social Determinants of Health (SDOH) Interventions SDOH Screenings   Food Insecurity: No Food Insecurity (03/27/2023)  Housing: Low Risk  (03/28/2023)  Transportation Needs: No Transportation Needs (03/27/2023)  Utilities: Not At Risk (03/28/2023)  Social Connections: Unknown (03/28/2023)  Tobacco Use: Medium Risk (04/02/2023)    Readmission Risk Interventions     No data to display

## 2023-04-03 ENCOUNTER — Telehealth: Payer: Self-pay | Admitting: Radiology

## 2023-04-03 ENCOUNTER — Encounter (HOSPITAL_COMMUNITY): Payer: Self-pay | Admitting: Orthopedic Surgery

## 2023-04-03 DIAGNOSIS — T8459XA Infection and inflammatory reaction due to other internal joint prosthesis, initial encounter: Secondary | ICD-10-CM

## 2023-04-03 DIAGNOSIS — B9562 Methicillin resistant Staphylococcus aureus infection as the cause of diseases classified elsewhere: Secondary | ICD-10-CM | POA: Diagnosis not present

## 2023-04-03 DIAGNOSIS — J101 Influenza due to other identified influenza virus with other respiratory manifestations: Secondary | ICD-10-CM | POA: Diagnosis not present

## 2023-04-03 DIAGNOSIS — B9689 Other specified bacterial agents as the cause of diseases classified elsewhere: Secondary | ICD-10-CM | POA: Diagnosis not present

## 2023-04-03 LAB — GLUCOSE, CAPILLARY
Glucose-Capillary: 100 mg/dL — ABNORMAL HIGH (ref 70–99)
Glucose-Capillary: 112 mg/dL — ABNORMAL HIGH (ref 70–99)
Glucose-Capillary: 134 mg/dL — ABNORMAL HIGH (ref 70–99)
Glucose-Capillary: 139 mg/dL — ABNORMAL HIGH (ref 70–99)
Glucose-Capillary: 97 mg/dL (ref 70–99)

## 2023-04-03 LAB — CBC WITH DIFFERENTIAL/PLATELET
Abs Immature Granulocytes: 0.07 10*3/uL (ref 0.00–0.07)
Basophils Absolute: 0.1 10*3/uL (ref 0.0–0.1)
Basophils Relative: 1 %
Eosinophils Absolute: 0.4 10*3/uL (ref 0.0–0.5)
Eosinophils Relative: 5 %
HCT: 31.1 % — ABNORMAL LOW (ref 39.0–52.0)
Hemoglobin: 10.6 g/dL — ABNORMAL LOW (ref 13.0–17.0)
Immature Granulocytes: 1 %
Lymphocytes Relative: 17 %
Lymphs Abs: 1.4 10*3/uL (ref 0.7–4.0)
MCH: 29.2 pg (ref 26.0–34.0)
MCHC: 34.1 g/dL (ref 30.0–36.0)
MCV: 85.7 fL (ref 80.0–100.0)
Monocytes Absolute: 0.8 10*3/uL (ref 0.1–1.0)
Monocytes Relative: 10 %
Neutro Abs: 5.6 10*3/uL (ref 1.7–7.7)
Neutrophils Relative %: 66 %
Platelets: 230 10*3/uL (ref 150–400)
RBC: 3.63 MIL/uL — ABNORMAL LOW (ref 4.22–5.81)
RDW: 13.7 % (ref 11.5–15.5)
WBC: 8.4 10*3/uL (ref 4.0–10.5)
nRBC: 0 % (ref 0.0–0.2)

## 2023-04-03 LAB — BASIC METABOLIC PANEL
Anion gap: 12 (ref 5–15)
BUN: 18 mg/dL (ref 8–23)
CO2: 21 mmol/L — ABNORMAL LOW (ref 22–32)
Calcium: 9.2 mg/dL (ref 8.9–10.3)
Chloride: 107 mmol/L (ref 98–111)
Creatinine, Ser: 2.24 mg/dL — ABNORMAL HIGH (ref 0.61–1.24)
GFR, Estimated: 31 mL/min — ABNORMAL LOW (ref >60)
Glucose, Bld: 116 mg/dL — ABNORMAL HIGH (ref 70–99)
Potassium: 3.6 mmol/L (ref 3.5–5.1)
Sodium: 140 mmol/L (ref 135–145)

## 2023-04-03 LAB — SEDIMENTATION RATE: Sed Rate: 101 mm/h — ABNORMAL HIGH (ref 0–16)

## 2023-04-03 LAB — C-REACTIVE PROTEIN: CRP: 4.1 mg/dL — ABNORMAL HIGH (ref <1.0)

## 2023-04-03 MED ORDER — APIXABAN 5 MG PO TABS
5.0000 mg | ORAL_TABLET | Freq: Two times a day (BID) | ORAL | Status: DC
  Administered 2023-04-03 – 2023-04-10 (×13): 5 mg via ORAL
  Filled 2023-04-03 (×18): qty 1

## 2023-04-03 MED ORDER — HYDRALAZINE HCL 20 MG/ML IJ SOLN
10.0000 mg | Freq: Four times a day (QID) | INTRAMUSCULAR | Status: DC | PRN
  Administered 2023-04-03 – 2023-04-10 (×5): 10 mg via INTRAVENOUS
  Filled 2023-04-03 (×5): qty 1

## 2023-04-03 MED ORDER — DAPTOMYCIN-SODIUM CHLORIDE 700-0.9 MG/100ML-% IV SOLN
700.0000 mg | Freq: Every day | INTRAVENOUS | Status: DC
  Administered 2023-04-04 – 2023-04-17 (×14): 700 mg via INTRAVENOUS
  Filled 2023-04-03 (×16): qty 100

## 2023-04-03 NOTE — Progress Notes (Signed)
Patient ID: Harold [name], male   DOB: Nov 15, 1955, 68 y.o.   MRN: [id number] Patient is a 68 year old gentleman who is postoperative day 1 repeat debridement of the right ankle wound status post open reduction internal fixation and status post extensive revascularization to the right lower extremity.  There was a large soft tissue defect that could not be closed.  Anticipate discharge on oral antibiotics pending final culture sensitivities and patient's ability to ambulate with therapy.  Patient will be able to weight-bear as tolerated in the postoperative fracture boot and will discharge with a Prevena plus portable wound VAC pump.

## 2023-04-03 NOTE — TOC Progression Note (Addendum)
Transition of Care [facility]) - Progression Note    Patient Details  Name: Harold [name] MRN: [id number] Date of Birth: 04/16/1955  Transition of Care [facility]) CM/SW Contact  Harold [name], LCSWA Phone Number: 04/03/2023, 1:54 PM  Clinical Narrative:     CSW spoke with [facility] who is going to relook at patients referral. CSW awaiting determination on if they can offer SNF bed for patient.  CSW followed up with [name] patients spouse regarding SNF bed offers. [name] would like for patient to get a bed offer with [facility]. CSW informed patients spouse that CSW is awaiting determination from [facility] that they are rereviewing patients referral. [name] informed CSW that if [facility] could not offer that 2nd choice would be [facility]. All questions answered, no further questions reported at this time.    Update- CSW spoke with [name] patients spouse and informed him that [facility] informed CSW that they can offer a SNF bed but that patient would be in his copay days upon admission. Facility would need confirmation that patient is willing to pay copays. Patients spouse confirmed unable to pay out of pocket for copay days. CSW informed patient. Patient confirmed. Plan for home when medically stable for dc. CSW informed CM. CM  to follow up for any additional home needs when medically stable for dc.  Expected Discharge Plan: Skilled Nursing Facility Barriers to Discharge: Continued Medical Work up  Expected Discharge Plan and Services In-house Referral: Clinical Social Work     Living arrangements for the past 2 months: Single Family Home                                       Social Determinants of Health (SDOH) Interventions SDOH Screenings   Food Insecurity: No Food Insecurity (03/27/2023)  Housing: Low Risk  (03/28/2023)  Transportation Needs: No Transportation Needs (03/27/2023)  Utilities: Not At Risk (03/28/2023)  Social Connections: Unknown (03/28/2023)  Tobacco Use:  Medium Risk (04/02/2023)    Readmission Risk Interventions     No data to display

## 2023-04-03 NOTE — Progress Notes (Signed)
Patient ID: Harold [name], male   DOB: [birth year], 68 y.o.   MRN: [id number] I spoke with patient's partner today who called me.  Patient's partner states that the patient is not safe as at home has fallen multiple times and is not safe for discharge back to home.  I have placed an order for skilled nursing placement.

## 2023-04-03 NOTE — Progress Notes (Signed)
PROGRESS NOTE    Harold [name]  VHQ:[id number] DOB: 1956/01/04 DOA: 03/27/2023 PCP: [name], MD   Brief Narrative:  Harold [name] is a 68 y.o. male with PMH significant for morbid obesity, OSA, DM2, HTN, HLD, CAD/stent, A-fib on Eliquis, carotid artery stenosis, CKD, chronic anemia, anxiety/depression, diabetic neuropathy, GERD, hypothyroidism, COPD, pulmonary hypertension. 2/6, patient presented to the ED with complaint of cough, subjective fever, myalgia, generalized weakness for 5 days. Respiratory virus panel positive for influenza A. Chest x-ray negative for infiltrates. Out of for Tamiflu.  He improved from fluid with supportive care.   On admission, he was also noted to have right leg cellulitis. Per chart review, in November 2024, he underwent ORIF of the right ankle, with ensuing revision in December 2024.  He has subsequently been followed by wound care at home.  However over the course the last 1 week, patient had progressive right lateral malleolus erythema, swelling, tenderness, with some evidence of purulent drainage, X-ray showed soft tissue swelling most pronounced over the lateral malleolus, consistent with cellulitis, without evidence of subcutaneous gas or crepitus to suggest necrotizing fasciitis.  Orthopedics and vascular surgery consult appreciated. Procedures as below.  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 S/p  right ankle hardware removal, I&D and wound VAC placement -2/10 Dr. Jena Gauss In the setting of uncontrolled diabetes, and possible PAD.  S/p repeat debridement of the right ankle by Dr. Lajoyce Corners 04/02/2023.  Patient on broad-spectrum IV antibiotics.  ID has been consulted for antibiotic guidance.  He is going to be seen by PT OT.  Starting on Eliquis but need to watch closely about drainage.   PAD Seen by vascular surgery.2/11, underwent aortogram, right leg angiogram, successful stenting of right superficial femoral artery.  Patient on Plavix.  Resuming Eliquis today since Dr. Lajoyce Corners has cleared him for that.   Acute diarrhea Resolved   Influenza A: Presented with 5 days of cough, fever, myalgia. Respiratory virus panel positive for influenza A. Chest x-ray without acute infiltrate. No fever, no WBC count Was outside the 48-hour window for Tamiflu and hence was not initiated.  Doing well on supportive care.  Type 2 diabetes mellitus / Diabetic neuropathy, POA: A1c 7 on 01/02/2023. PTA meds-Tresiba 60 units daily, Mounjaro Currently on Lantus 30 units daily and SSI/Accu-Cheks.  Blood sugar very well-controlled. PTA on gabapentin 1600 mg 3 times daily???.  Seems very high-dose.  Unable to confirm from the patient. Currently on reduced dose of gabapentin at 400 mg 3 times daily.  AKI on CKD 4 Baseline creatinine 2.13 from December 2024.  Creatinine worsened to peak at 2.89 on 2/9, gradually improved and back to baseline now.  Avoid nephrotoxic agents.  Paroxysmal A-fib Rates controlled on PTA meds Coreg, amiodarone.  Resuming Eliquis.   Essential hypertension PTA meds- Coreg 3.125 mg twice daily, Lasix 160 mg daily, currently on home dose of Coreg but Lasix 40 mg p.o. twice daily,  blood pressure elevated but that was prior to getting Coreg.  Monitor closely, will add hydralazine as needed.  CAD/stent Carotid artery stenosis HLD Continue PTA meds -Eliquis, Plavix, Crestor   Hypothyroidism Continue  Synthroid   Morbid Obesity  Body mass index is 39.99 kg/m. Patient has been advised to make an attempt to improve diet and exercise patterns to aid in weight loss.   Obstructive sleep apnea Patient reported good compliance on home nocturnal CPAP   Gout Allopurinol, colchicine   Depression Cymbalta  DVT prophylaxis: SCDs Start: 04/02/23 1617 SCDs Start: 03/31/23 1620 SCDs Start: 03/27/23 2243   Code Status: Full Code  Family Communication:  None present at bedside.  Plan of care discussed with patient in length and he/she verbalized understanding and agreed with it.  Status is: Inpatient Remains inpatient appropriate because: Starting on Eliquis, needs to watch overnight for excessive bleeding, also needs to be seen by ID and he needs placement to SNF.  He should be ready for discharge by tomorrow from medical perspective.   Estimated body mass index is 40.11 kg/m as calculated from the following:   Height as of this encounter: 6\' 1"  (1.854 m).   Weight as of this encounter: 137.9 kg.    Nutritional Assessment: Body mass index is 40.11 kg/m.Marland Kitchen Seen by dietician.  I agree with the assessment and plan as outlined below: Nutrition Status:        . Skin Assessment: I have examined the patient's skin and I agree with the wound assessment as performed by the wound care RN as outlined below:    Consultants:  Vascular surgery, orthopedics and ID  Procedures:  As above  Antimicrobials:  Anti-infectives (From admission, onward)    Start     Dose/Rate Route Frequency Ordered Stop   04/02/23 1715  ceFAZolin (ANCEF) IVPB 2g/100 mL premix  Status:  Discontinued        2 g 200 mL/hr over 30 Minutes Intravenous Every 8 hours 04/02/23 1616 04/02/23 1623   04/02/23 0600  ceFAZolin (ANCEF) IVPB 3g/150 mL premix  Status:  Discontinued        3 g 300 mL/hr over 30 Minutes Intravenous On call to O.R. 04/01/23 1700 04/02/23 1549   03/31/23 1015  ceFAZolin (ANCEF) IVPB 2g/100 mL  premix        2 g 200 mL/hr over 30 Minutes Intravenous On call to O.R. 03/31/23 0921 03/31/23 1203   03/29/23 2200  vancomycin (VANCOREADY) IVPB 2000 mg/400 mL       Placed in "Followed by" Linked Group   2,000 mg 200 mL/hr over 120 Minutes Intravenous Every 48 hours 03/27/23 2257     03/28/23 1045  metroNIDAZOLE (FLAGYL) tablet 500 mg        500 mg Oral Every 12 hours 03/28/23 0955     03/27/23 2330  vancomycin (VANCOCIN) 2,500 mg in sodium chloride 0.9 % 500 mL IVPB       Placed in "Followed by" Linked Group   2,500 mg 262.5 mL/hr over 120 Minutes Intravenous  Once 03/27/23 2257 03/28/23 0241   03/27/23 2300  metroNIDAZOLE (FLAGYL) IVPB 500 mg  Status:  Discontinued        500 mg 100 mL/hr over 60 Minutes Intravenous Every 12 hours 03/27/23 2247 03/28/23 0955   03/27/23 2300  ceFEPIme (MAXIPIME) 2 g in sodium chloride 0.9 % 100 mL IVPB        2 g 200 mL/hr over 30 Minutes Intravenous Every 12 hours 03/27/23  2257     03/27/23 2100  ceFAZolin (ANCEF) IVPB 2g/100 mL premix        2 g 200 mL/hr over 30 Minutes Intravenous  Once 03/27/23 2058 03/27/23 2219         Subjective: Patient seen and examined.  He has no complaints.  He was under the impression that he will be going home today, he told me that Dr Lajoyce Corners told him.  I informed him that he needs to stay overnight, to be seen by ID and eventually may need to go to SNF per PT recommendations unless PT updates and changes their recommendations today.  Objective: Vitals:   04/02/23 2355 04/03/23 0327 04/03/23 0753 04/03/23 0803  BP: (!) 167/64 (!) 178/60 (!) 187/68 (!) 187/68  Pulse: (!) 58 60 63 64  Resp: 18 19 20    Temp: 98.1 F (36.7 C) 97.7 F (36.5 C) 97.7 F (36.5 C)   TempSrc: Oral Oral Oral   SpO2: 99% 98% 97%   Weight:  (!) 137.9 kg    Height:        Intake/Output Summary (Last 24 hours) at 04/03/2023 0837 Last data filed at 04/03/2023 1610 Gross per 24 hour  Intake 250 ml  Output 1585 ml  Net -1335 ml    Filed Weights   04/01/23 1331 04/02/23 0415 04/03/23 0327  Weight: (!) 143.3 kg (!) 137.5 kg (!) 137.9 kg    Examination:  General exam: Appears calm and comfortable  Respiratory system: Clear to auscultation. Respiratory effort normal. Cardiovascular system: S1 & S2 heard, RRR. No JVD, murmurs, rubs, gallops or clicks. No pedal edema. Gastrointestinal system: Abdomen is nondistended, soft and nontender. No organomegaly or masses felt. Normal bowel sounds heard. Central nervous system: Alert and oriented. No focal neurological deficits. Psychiatry: Judgement and insight appear normal. Mood & affect appropriate.    Data Reviewed: I have personally reviewed following labs and imaging studies  CBC: Recent Labs  Lab 03/29/23 0407 03/30/23 0501 04/01/23 0529 04/02/23 0456 04/03/23 0455  WBC 6.4 7.7 8.4 7.1 8.4  NEUTROABS 4.0 4.5 6.0 5.0 5.6  HGB 11.7* 11.3* 11.5* 10.6* 10.6*  HCT 33.7* 32.1* 33.3* 31.5* 31.1*  MCV 84.9 84.3 85.6 85.6 85.7  PLT 161 192 211 210 230   Basic Metabolic Panel: Recent Labs  Lab 03/27/23 1832 03/28/23 0447 03/29/23 0407 03/30/23 0501 03/31/23 0828 04/01/23 0529 04/02/23 0456 04/03/23 0455  NA 135 134*   < > 136 138 139 138 140  K 3.5 2.9*   < > 3.3* 3.5 3.6 3.5 3.6  CL 99 99   < > 104 107 107 105 107  CO2 22 20*   < > 20* 22 20* 20* 21*  GLUCOSE 241* 208*   < > 175* 169* 114* 139* 116*  BUN 22 23   < > 27* 26* 22 20 18   CREATININE 2.73* 2.96*   < > 2.89* 2.58* 2.28* 2.21* 2.24*  CALCIUM 8.8* 8.1*   < > 8.5* 8.7* 8.8* 9.0 9.2  MG 1.9 1.9  --   --   --   --   --   --    < > = values in this interval not displayed.   GFR: Estimated Creatinine Clearance: 45.4 mL/min (A) (by C-G formula based on SCr of 2.24 mg/dL (H)). Liver Function Tests: Recent Labs  Lab 03/27/23 1832 03/28/23 0447  AST 21 11*  ALT 11 9  ALKPHOS 77 63  BILITOT 0.8 0.7  PROT 6.1* 5.3*  ALBUMIN 2.3* 2.1*   No results for input(s): "LIPASE", "AMYLASE" in the last  168 hours. No results for input(s): "AMMONIA" in the last 168 hours. Coagulation Profile: Recent Labs  Lab 03/28/23 0447  INR 1.3*   Cardiac Enzymes: No results for input(s): "CKTOTAL", "CKMB", "CKMBINDEX", "TROPONINI" in the last 168 hours. BNP (last 3 results) No results for input(s): "PROBNP" in the last 8760 hours. HbA1C: No results for input(s): "HGBA1C" in the last 72 hours. CBG: Recent Labs  Lab 04/02/23 1506 04/02/23 1707 04/03/23 0032 04/03/23 0550 04/03/23 0749  GLUCAP 125* 121* 100* 97 112*   Lipid Profile: Recent Labs    04/02/23 0456  CHOL 84  HDL 24*  LDLCALC 28  TRIG 409*  CHOLHDL 3.5   Thyroid Function Tests: No results for input(s): "TSH", "T4TOTAL", "FREET4", "T3FREE", "THYROIDAB" in the last 72 hours. Anemia Panel: No results for input(s): "VITAMINB12", "FOLATE", "FERRITIN", "TIBC", "IRON", "RETICCTPCT" in the last 72 hours. Sepsis Labs: Recent Labs  Lab 03/28/23 0447  PROCALCITON 0.31    Recent Results (from the past 240 hours)  Resp panel by RT-PCR (RSV, Flu A&B, Covid) Anterior Nasal Swab     Status: Abnormal   Collection Time: 03/27/23  6:33 PM   Specimen: Anterior Nasal Swab  Result Value Ref Range Status   SARS Coronavirus 2 by RT PCR NEGATIVE NEGATIVE Final   Influenza A by PCR POSITIVE (A) NEGATIVE Final   Influenza B by PCR NEGATIVE NEGATIVE Final    Comment: (NOTE) The Xpert Xpress SARS-CoV-2/FLU/RSV plus assay is intended as an aid in the diagnosis of influenza from Nasopharyngeal swab specimens and should not be used as a sole basis for treatment. Nasal washings and aspirates are unacceptable for Xpert Xpress SARS-CoV-2/FLU/RSV testing.  Fact Sheet for Patients: BloggerCourse.com  Fact Sheet for Healthcare Providers: SeriousBroker.it  This test is not yet approved or cleared by the [place] FDA and has been authorized for detection and/or diagnosis of SARS-CoV-2  by FDA under an Emergency Use Authorization (EUA). This EUA will remain in effect (meaning this test can be used) for the duration of the COVID-19 declaration under Section 564(b)(1) of the Act, 21 U.S.C. section 360bbb-3(b)(1), unless the authorization is terminated or revoked.     Resp Syncytial Virus by PCR NEGATIVE NEGATIVE Final    Comment: (NOTE) Fact Sheet for Patients: BloggerCourse.com  Fact Sheet for Healthcare Providers: SeriousBroker.it  This test is not yet approved or cleared by the [place] FDA and has been authorized for detection and/or diagnosis of SARS-CoV-2 by FDA under an Emergency Use Authorization (EUA). This EUA will remain in effect (meaning this test can be used) for the duration of the COVID-19 declaration under Section 564(b)(1) of the Act, 21 U.S.C. section 360bbb-3(b)(1), unless the authorization is terminated or revoked.  Performed at [facility], [address] [place], [place] [address]   Respiratory (~20 pathogens) panel by PCR     Status: Abnormal   Collection Time: 03/28/23  3:15 PM   Specimen: Nasopharyngeal Swab; Respiratory  Result Value Ref Range Status   Adenovirus NOT DETECTED NOT DETECTED Final   Coronavirus 229E NOT DETECTED NOT DETECTED Final    Comment: (NOTE) The Coronavirus on the Respiratory Panel, DOES NOT test for the novel  Coronavirus (2019 nCoV)    Coronavirus HKU1 NOT DETECTED NOT DETECTED Final   Coronavirus NL63 NOT DETECTED NOT DETECTED Final   Coronavirus OC43 NOT DETECTED NOT DETECTED Final   Metapneumovirus NOT DETECTED NOT DETECTED  Final   Rhinovirus / Enterovirus NOT DETECTED NOT DETECTED Final   Influenza A H3 DETECTED (A) NOT DETECTED Final   Influenza B NOT DETECTED NOT DETECTED Final   Parainfluenza Virus 1 NOT DETECTED NOT DETECTED Final   Parainfluenza Virus 2 NOT DETECTED NOT DETECTED Final   Parainfluenza Virus 3 NOT DETECTED NOT DETECTED  Final   Parainfluenza Virus 4 NOT DETECTED NOT DETECTED Final   Respiratory Syncytial Virus NOT DETECTED NOT DETECTED Final   Bordetella pertussis NOT DETECTED NOT DETECTED Final   Bordetella Parapertussis NOT DETECTED NOT DETECTED Final   Chlamydophila pneumoniae NOT DETECTED NOT DETECTED Final   Mycoplasma pneumoniae NOT DETECTED NOT DETECTED Final    Comment: Performed at [facility], [address], [place], [place] [address]  Surgical pcr screen     Status: None   Collection Time: 03/31/23 10:15 AM   Specimen: Nasal Mucosa; Nasal Swab  Result Value Ref Range Status   MRSA, PCR NEGATIVE NEGATIVE Final   Staphylococcus aureus NEGATIVE NEGATIVE Final    Comment: (NOTE) The Xpert SA Assay (FDA approved for NASAL specimens in patients 77 years of age and older), is one component of a comprehensive surveillance program. It is not intended to diagnose infection nor to guide or monitor treatment. Performed at [facility], [address] [place], [place] [address]   Aerobic/Anaerobic Culture w Gram Stain (surgical/deep wound)     Status: None (Preliminary result)   Collection Time: 03/31/23 11:54 AM   Specimen: Path fluid; Tissue  Result Value Ref Range Status   Specimen Description WOUND RIGHT ANKLE  Final   Special Requests NONE  Final   Gram Stain   Final    RARE WBC PRESENT, PREDOMINANTLY PMN NO ORGANISMS SEEN Performed at [facility], [address], [place], [place] [address]    Culture   Final    RARE STAPHYLOCOCCUS EPIDERMIDIS RARE GRAM NEGATIVE RODS IDENTIFICATION AND SUSCEPTIBILITIES TO FOLLOW NO ANAEROBES ISOLATED; CULTURE IN PROGRESS FOR 5 DAYS    Report Status PENDING  Incomplete  Aerobic/Anaerobic Culture w Gram Stain (surgical/deep wound)     Status: None (Preliminary result)   Collection Time: 04/02/23  2:27 PM   Specimen: Wound; Tissue  Result Value Ref Range Status   Specimen Description WOUND RIGHT ANKLE  Final   Special Requests NONE   Final   Gram Stain   Final    NO WBC SEEN NO ORGANISMS SEEN Performed at [facility], [address] [place], [place] [address]    Culture PENDING  Incomplete   Report Status PENDING  Incomplete     Radiology Studies: PERIPHERAL VASCULAR CATHETERIZATION Result Date: 04/01/2023 Images from the original result were not included. hospital Patient name: Harold [name] MRN: [id number] DOB: Jun 18, 1955 Sex: male 04/01/2023 Pre-operative Diagnosis: Right leg wound Post-operative diagnosis:  Same Surgeon:  Durene Cal Procedure Performed:  1.  Ultrasound-guided access, left femoral artery  2.  Abdominal aortogram with CO2  3.  Selective injection right superficial femoral artery  4.  Right leg angiogram  5.  Stent, right superficial femoral artery  6.  Conscious sedation, 50 minutes  7.  Closure device, Mynx Indications: This is a 68 year old gentleman with infected hardware in his right leg requiring removal.  He has a wound there.  He has known arterial insufficiency.  He comes in for further evaluation and possible intervention Procedure:  The patient was identified in the holding area and taken to  room 8.  The patient was then placed supine on the table and prepped and draped in the usual sterile fashion.  A time out was called.  Conscious sedation was administered with the use of IV fentanyl and Versed under continuous physician and nurse monitoring.  Heart rate, blood pressure, and oxygen saturation were continuously monitored.  Total sedation time was 50 minutes.  Ultrasound was used to evaluate the left common femoral artery.  It was patent .  A digital ultrasound image was acquired.  A micropuncture needle was used to access the left common femoral artery under ultrasound guidance.  An 018 wire was advanced without resistance and a micropuncture sheath was placed.  The 018 wire was removed and a benson wire was placed.  The micropuncture sheath was exchanged for a 5 french sheath.  An  omniflush catheter was advanced over the wire to the level of L-1.  An abdominal angiogram with CO2 was obtained.  Next, using the omniflush catheter and a benson wire, the aortic bifurcation was crossed and the catheter was placed into theright external iliac artery and right runoff was obtained.  Additional images of the right leg were obtained using contrast with the catheter in the right superficial femoral artery Findings:  Aortogram: No significant renal artery stenosis.  The infrarenal abdominal aorta is widely patent without stenosis.  Bilateral common and external iliac arteries are widely patent.  Bilateral common femoral arteries are widely patent.  Right Lower Extremity: The right common femoral and profundofemoral artery are widely patent without stenosis.  The superficial femoral artery is patent throughout its course however there is a 10 cm segment in the adductor canal that is heavily calcified with greater than 70% stenosis.  Popliteal artery is patent throughout its course.  There is two-vessel runoff via the posterior tibial and peroneal.  The dominant is the posterior tibial.  There is reconstitution of a distal dorsalis pedis.  Left Lower Extremity: Not evaluated Intervention: After the above images were acquired the decision was made to proceed with intervention.  A 6 French 45 cm sheath was advanced into the right external iliac artery.  I then advanced a versa core wire across the stenosis in the adductor canal.  The patient was fully heparinized.  I elected to primarily stent the lesions.  A 7 x 150 Eluvia stent was deployed and postdilated with a 6 mm balloon.  Completion imaging revealed resolution of the stenosis.  The groin was closed with a Mynx Impression:  #1  Successful stenting of a diffuse moderate length stenosis in the right adductor canal using a 7 x 150 Eluvia stent  #2  Two-vessel runoff via the posterior tibial and peroneal artery.  The posterior tibial is the dominant  vessel  #3  If the patient has difficulty with wound healing, he could be considered for anterior tibial intervention  V. Durene Cal, M.D., FACS [facility] Office: [phone] Pager:  [phone]    Scheduled Meds:  allopurinol  100 mg Oral Daily   amiodarone  200 mg Oral BID   aspirin EC  81 mg Oral Daily   carvedilol  3.125 mg Oral BID WC   clopidogrel  75 mg Oral Q breakfast   docusate sodium  100 mg Oral BID   DULoxetine  60 mg Oral Daily   ezetimibe  10 mg Oral Daily   furosemide  40 mg Oral BID   gabapentin  400 mg Oral TID   guaiFENesin  600 mg Oral BID   insulin aspart  0-9 Units Subcutaneous Q6H   insulin glargine-yfgn  30 Units Subcutaneous Daily   levothyroxine  88 mcg Oral QAC breakfast   metroNIDAZOLE  500 mg Oral Q12H   rosuvastatin  20 mg Oral QPM   sodium chloride flush  3 mL Intravenous Q12H   sodium chloride flush  3-10 mL Intravenous Q12H   Continuous Infusions:  ceFEPime (MAXIPIME) IV 2 g (04/02/23 2053)   vancomycin 2,000 mg (04/02/23 2159)     LOS: 7 days   Hughie Closs, MD [facility]  04/03/2023, 8:37 AM   *Please note that this is a verbal dictation therefore any spelling or grammatical errors are due to the "Dragon Medical One" system interpretation.  Please page via Amion and do not message via secure chat for urgent patient care matters. Secure chat can be used for non urgent patient care matters.  How to contact the [name] Attending or Consulting provider 7A - 7P or covering provider during after hours 7P -7A, for this patient?  Check the care team in [facility] and look for a) attending/consulting [facility] provider listed and b) the [facility] team listed. Page or secure chat 7A-7P. Log into [email or web address] and use [place] universal password to access. If you do not have the password, please contact the hospital operator. Locate the [facility] provider you are looking for under [facility] and page to a number that you  can be directly reached. If you still have difficulty reaching the provider, please page the [facility] (Director on Call) for the Hospitalists listed on amion for assistance.

## 2023-04-03 NOTE — Anesthesia Postprocedure Evaluation (Signed)
Anesthesia Post Note  Patient: Harold [name]  Procedure(s) Performed: DEBRIDEMENT RIGHT ANKLE (Right: Ankle)     Patient location during evaluation: PACU Anesthesia Type: General Level of consciousness: awake and alert Pain management: pain level controlled Vital Signs Assessment: post-procedure vital signs reviewed and stable Respiratory status: spontaneous breathing, nonlabored ventilation and respiratory function stable Cardiovascular status: blood pressure returned to baseline and stable Postop Assessment: no apparent nausea or vomiting Anesthetic complications: no   No notable events documented.  Last Vitals:  Vitals:   04/02/23 2355 04/03/23 0327  BP: (!) 167/64 (!) 178/60  Pulse: (!) 58 60  Resp: 18 19  Temp: 36.7 C 36.5 C  SpO2: 99% 98%    Last Pain:  Vitals:   04/03/23 0327  TempSrc: Oral  PainSc:                  Lowella Curb

## 2023-04-03 NOTE — Evaluation (Signed)
Physical Therapy Re-evaluation Patient Details Name: Harold [name] MRN: [id number] DOB: [birth year] Today's Date: 04/03/2023  History of Present Illness  68 y.o. male presents to [facility] 03/27/23 with productive cough and generalized weakness. Admitted with influenza A and cellulitis of R foot s/p hardware removal of the R ankle and application of wound vac on 2/10. Prior admit 11/24 for R ankle ORIF and in 12/24 for revision. S/p excisional debridement of right ankle on 2/12. Significant PMH: HTN, HLD, CAD s/p PCI, COPD, DM2, CKD stage IIIb, PVD, gout, OSA, neuropathy, COPD, a-fib, CAD s/p percutaneous coronary angioplasty, angina, obesity  Clinical Impression  Pt is a  y.o. male presenting   Pt is a 68 y.o. male presenting on 04/02/23 with above conditions and deficits below, see PT Problem List. PTA pt reports 4 falls s/t moving too quickly or sliding out of chair/bed. Pt lives in a one-level home with his spouse. Pt's home has 3 STE with no rail. Currently pt is mod A x2 for sit to stands, min A for supine to sit, and mod A for sit to supine. During session, pt reported dizziness, lightheadedness, and increased fatigue upon standing. Upon returning to sitting pt's BP normalized and s/sxs decreased. Pt required cueing for maintaining weightbearing precautions and had difficulty maintaining the least amount of weight through his R LE during a stand pivot transfer, putting around 50% through the limb. Pt would benefit continued PT services to promote weightbearing education during transfers, activity tolerance, LE strength, ambulation, balance, and functional mobility. PT recommends intense rehab < 3 hours upon d/c secondary to family support after d/c, PLOF, and anticipated progression. Pt prefers [facility], if available. Will continue to follow acutely.   Pt's BP  Seated beginning: 149/72 (93) Seated s/p 1st STS: 126/72 (82) Seated s/p stand pivot: 151/72 (94) Supine EOS: 170/87 (110)          If plan is discharge home, recommend the following: A lot of help with walking and/or transfers;A lot of help with bathing/dressing/bathroom;Assistance with cooking/housework;Assist for transportation;Help with stairs or ramp for entrance   Can travel by private vehicle   No    Equipment Recommendations BSC/3in1;Wheelchair (measurements PT);Wheelchair cushion (measurements PT);Hoyer lift (pending progress)  Recommendations for Other Services       Functional Status Assessment Patient has had a recent decline in their functional status and demonstrates the ability to make significant improvements in function in a reasonable and predictable amount of time.     Precautions / Restrictions Precautions Precautions: Fall Precaution/Restrictions Comments: Watch BP Required Braces or Orthoses: Other Brace Other Brace: R CAM boot Restrictions Weight Bearing Restrictions Per Provider Order: Yes RLE Weight Bearing Per Provider Order: Touchdown weight bearing (Per verbal order directly from Dr. Lajoyce Corners 04/03/23 - Ideally TDWB but doubt he can get around without full weight bearing)      Mobility  Bed Mobility Overal bed mobility: Needs Assistance Bed Mobility: Sit to Supine, Supine to Sit     Supine to sit: Mod assist, HOB elevated, Used rails, Min assist Sit to supine: Mod assist, HOB elevated, Used rails   General bed mobility comments: Pt required increased time, use of rails, and cueing for hand placement to rail and proper sequencing. Min A for LE management supine to sit, mod A for trunk management; mod A LE management sit to supine    Transfers Overall transfer level: Needs assistance Equipment used: Rolling walker (2 wheels) Transfers: Sit to/from Stand Sit to Stand: Mod assist, +2 physical  assistance, From elevated surface Stand pivot transfers: Mod assist, +2 physical assistance, From elevated surface         General transfer comment: Pt required cueing for hand  placement on bed rather than RW and for maintaining weightbearing precautions. During  pt's 1st STS, pt put around 10% of weight through R LE. During 2nd STS and stand pivot, pt put around 50% of weight through R LE as he attempted to pivot higher along the bed. Pt was given cueing to extend R LE and push weight through UEs.    Ambulation/Gait               General Gait Details: Unable at this time  Stairs            Wheelchair Mobility     Tilt Bed    Modified Rankin (Stroke Patients Only)       Balance Overall balance assessment: Needs assistance, History of Falls Sitting-balance support: No upper extremity supported, Feet supported Sitting balance-Leahy Scale: Good Sitting balance - Comments: Pt able to sit EOB without UE support and slight posterior lean. No LOB Postural control: Posterior lean Standing balance support: Bilateral upper extremity supported, Reliant on assistive device for balance Standing balance-Leahy Scale: Poor Standing balance comment: Pt reliant on RW and provider assistance for standing balance s/t LE weakness, lightheadness, and R LE weightbearing precautions                             Pertinent Vitals/Pain Pain Assessment Pain Assessment: No/denies pain Pain Intervention(s): Monitored during session    Home Living Family/patient expects to be discharged to:: Private residence Living Arrangements: Spouse/significant other Available Help at Discharge: Family Type of Home: House Home Access: Stairs to enter Entrance Stairs-Rails: None Entrance Stairs-Number of Steps: 3   Home Layout: One level Home Equipment: Pharmacist, hospital (2 wheels)      Prior Function Prior Level of Function : History of Falls (last six months);Needs assist             Mobility Comments: in dec. returned home from rehab, 4 falls since returning home; reports using RW for mobility ADLs Comments: needing assist for ADLs from  significant other     Extremity/Trunk Assessment   Upper Extremity Assessment Upper Extremity Assessment: Defer to OT evaluation    Lower Extremity Assessment Lower Extremity Assessment: Generalized weakness;RLE deficits/detail;LLE deficits/detail RLE Deficits / Details: Decreased ROM in all ankle motions RLE Sensation: history of peripheral neuropathy LLE Sensation: history of peripheral neuropathy    Cervical / Trunk Assessment Cervical / Trunk Assessment: Kyphotic  Communication   Communication Communication: No apparent difficulties    Cognition Arousal: Alert Behavior During Therapy: WFL for tasks assessed/performed   PT - Cognitive impairments: No apparent impairments                         Following commands: Intact       Cueing Cueing Techniques: Verbal cues, Visual cues     General Comments General comments (skin integrity, edema, etc.): During 1st STS began to feel lightheaded and became pale. He returned to sitting, BP was taken. Pt began to improve with 5 minutes of rest sitting. See PT comments for full BP readings during session    Exercises General Exercises - Lower Extremity Ankle Circles/Pumps: AROM, Right, Left, Both, 10 reps Heel Slides: Both, AROM, 5 reps Hip ABduction/ADduction: Both, 10 reps,  AROM, Supine Straight Leg Raises: AROM, Both, 5 reps, Supine   Assessment/Plan    PT Assessment Patient needs continued PT services  PT Problem List Decreased strength;Decreased range of motion;Decreased activity tolerance;Decreased balance;Decreased mobility;Decreased coordination;Decreased cognition;Decreased knowledge of use of DME;Decreased safety awareness;Decreased knowledge of precautions;Cardiopulmonary status limiting activity;Obesity       PT Treatment Interventions DME instruction;Gait training;Stair training;Functional mobility training;Therapeutic activities;Therapeutic exercise;Balance training;Neuromuscular  re-education;Patient/family education    PT Goals (Current goals can be found in the Care Plan section)  Acute Rehab PT Goals Patient Stated Goal: to get better PT Goal Formulation: With patient Time For Goal Achievement: 04/17/23 Potential to Achieve Goals: Good    Frequency Min 1X/week     Co-evaluation               AM-PAC PT "6 Clicks" Mobility  Outcome Measure Help needed turning from your back to your side while in a flat bed without using bedrails?: A Lot Help needed moving from lying on your back to sitting on the side of a flat bed without using bedrails?: A Lot Help needed moving to and from a bed to a chair (including a wheelchair)?: Total Help needed standing up from a chair using your arms (e.g., wheelchair or bedside chair)?: Total Help needed to walk in hospital room?: Total Help needed climbing 3-5 steps with a railing? : Total 6 Click Score: 8    End of Session Equipment Utilized During Treatment: Gait belt Activity Tolerance: Patient limited by fatigue;Other (comment) (orthostatic hypotension) Patient left: in bed;with call bell/phone within reach;with bed alarm set Nurse Communication: Mobility status;Need for lift equipment PT Visit Diagnosis: Other abnormalities of gait and mobility (R26.89);Unsteadiness on feet (R26.81);Muscle weakness (generalized) (M62.81);History of falling (Z91.81);Difficulty in walking, not elsewhere classified (R26.2)    Time: 2841-3244 PT Time Calculation (min) (ACUTE ONLY): 45 min   Charges:   PT Evaluation $PT Re-evaluation: 1 Re-eval PT Treatments $Therapeutic Exercise: 8-22 mins $Therapeutic Activity: 8-22 mins PT General Charges $$ ACUTE PT VISIT: 1 Visit       [address], SPT   [facility] 04/03/2023, 2:27 PM

## 2023-04-03 NOTE — Telephone Encounter (Signed)
Harold [name] ( patients spouse) left voicemail on triage requesting a call back.   Patient is being seen by Dr. Lajoyce Corners at [facility] expected to be released home today.  Harold [name] is wanted some clarification if patient will be sent to rehab or home. States patient is fall risk and falls multiple times a week and now has to call EMS to help get patient up. Harold [name] is concerned about patient coming home at this time due to the care he is able to provide.  States patient is still currently in hospital but would like a call before being discharged.   Patient had right ankle debridement yesterday 04/02/23  Contact number: [phone]

## 2023-04-04 DIAGNOSIS — M86171 Other acute osteomyelitis, right ankle and foot: Secondary | ICD-10-CM

## 2023-04-04 DIAGNOSIS — J101 Influenza due to other identified influenza virus with other respiratory manifestations: Secondary | ICD-10-CM | POA: Diagnosis not present

## 2023-04-04 LAB — BASIC METABOLIC PANEL
Anion gap: 10 (ref 5–15)
BUN: 19 mg/dL (ref 8–23)
CO2: 21 mmol/L — ABNORMAL LOW (ref 22–32)
Calcium: 8.7 mg/dL — ABNORMAL LOW (ref 8.9–10.3)
Chloride: 104 mmol/L (ref 98–111)
Creatinine, Ser: 2.08 mg/dL — ABNORMAL HIGH (ref 0.61–1.24)
GFR, Estimated: 34 mL/min — ABNORMAL LOW (ref >60)
Glucose, Bld: 150 mg/dL — ABNORMAL HIGH (ref 70–99)
Potassium: 3 mmol/L — ABNORMAL LOW (ref 3.5–5.1)
Sodium: 135 mmol/L (ref 135–145)

## 2023-04-04 LAB — CK: Total CK: 37 U/L — ABNORMAL LOW (ref 49–397)

## 2023-04-04 LAB — GLUCOSE, CAPILLARY
Glucose-Capillary: 125 mg/dL — ABNORMAL HIGH (ref 70–99)
Glucose-Capillary: 148 mg/dL — ABNORMAL HIGH (ref 70–99)
Glucose-Capillary: 149 mg/dL — ABNORMAL HIGH (ref 70–99)
Glucose-Capillary: 163 mg/dL — ABNORMAL HIGH (ref 70–99)
Glucose-Capillary: 173 mg/dL — ABNORMAL HIGH (ref 70–99)

## 2023-04-04 MED ORDER — FUROSEMIDE 40 MG PO TABS
80.0000 mg | ORAL_TABLET | Freq: Two times a day (BID) | ORAL | Status: DC
  Administered 2023-04-04 – 2023-04-08 (×9): 80 mg via ORAL
  Filled 2023-04-04 (×10): qty 2

## 2023-04-04 MED ORDER — POTASSIUM CHLORIDE CRYS ER 20 MEQ PO TBCR
40.0000 meq | EXTENDED_RELEASE_TABLET | ORAL | Status: AC
  Administered 2023-04-04 (×2): 40 meq via ORAL
  Filled 2023-04-04 (×2): qty 2

## 2023-04-04 MED ORDER — AMLODIPINE BESYLATE 5 MG PO TABS
5.0000 mg | ORAL_TABLET | Freq: Every day | ORAL | Status: DC
  Administered 2023-04-04 – 2023-04-15 (×11): 5 mg via ORAL
  Filled 2023-04-04 (×7): qty 1
  Filled 2023-04-04: qty 2
  Filled 2023-04-04: qty 1
  Filled 2023-04-04: qty 2
  Filled 2023-04-04 (×2): qty 1

## 2023-04-04 NOTE — Progress Notes (Signed)
Occupational Therapy Treatment Patient Details Name: Harold [name] MRN: [id number] DOB: 08-19-1955 Today's Date: 04/04/2023   History of present illness 68 y.o. male presents to [facility] 03/27/23 with productive cough and generalized weakness. Admitted with influenza A and cellulitis of R foot s/p hardware removal of the R ankle and application of wound vac on 2/10. Prior admit 11/24 for R ankle ORIF and in 12/24 for revision. S/p excisional debridement of right ankle on 2/12. Significant PMH: HTN, HLD, CAD s/p PCI, COPD, DM2, CKD stage IIIb, PVD, gout, OSA, neuropathy, COPD, a-fib, CAD s/p percutaneous coronary angioplasty, angina, obesity   OT comments  This 68 yo male admitted with above seen today due to now D/C plan is to go home instead of SNF. He was able to get to EOB by himself with HOB up, rails, and increased time. He stood with min A from raised bed (uses lift chair at home) and step pivoted to recliner. Pt did have a small posterior lean that he was aware of and needed min A to correct x2. He has his husband [name] at home to A him prn. We will continue to follow if D/C over weekend is delayed. HHOT is not recommendation due to co-pay days if he goes to SNF.      If plan is discharge home, recommend the following:  A little help with walking and/or transfers;A lot of help with bathing/dressing/bathroom;Assistance with cooking/housework;Assist for transportation;Help with stairs or ramp for entrance   Equipment Recommendations   (pt prefers not to have a 3n1)       Precautions / Restrictions Precautions Precautions: Fall Recall of Precautions/Restrictions: Intact Required Braces or Orthoses: Other Brace Other Brace: R CAM boot for ambulation, splint at night Restrictions Weight Bearing Restrictions Per Provider Order: No RLE Weight Bearing Per Provider Order: Weight bearing as tolerated Other Position/Activity Restrictions: per Dr. Audrie Lia note 04/03/2023       Mobility Bed  Mobility Overal bed mobility: Modified Independent Bed Mobility: Sit to Supine       Sit to supine: HOB elevated, Used rails, Modified independent (Device/Increase time)   General bed mobility comments: increased time as well    Transfers Overall transfer level: Needs assistance Equipment used: Rolling walker (2 wheels) Transfers: Sit to/from Stand Sit to Stand: Min assist, From elevated surface     Step pivot transfers: Min assist           Balance Overall balance assessment: Needs assistance Sitting-balance support: No upper extremity supported, Feet supported Sitting balance-Leahy Scale: Good     Standing balance support: Bilateral upper extremity supported, Reliant on assistive device for balance Standing balance-Leahy Scale: Poor Standing balance comment: tendency for posterior lean                           ADL either performed or assessed with clinical judgement   ADL Overall ADL's : Needs assistance/impaired                           Toilet Transfer Details (indicate cue type and reason): Simulated bed (raised) stand step to recliner in cam boot WBAT (pt with tendency for posterior lean           General ADL Comments: Pt's husband [name] called while I was there and I made him aware that Benuel can be WBAT in [place] but probably should not be walking long distances and that I  would provide them with a gait belt since Jerremy tends to have a posterior lean when up with RW (a shoe for other foot may help, [name] to bring one in when he is able to come up -not feeling well. Pt reports he can get around in wheelchair in common space in house but will not fit through bedroom or bathroom door, so he has to stand up and ambulate with his RW in thes spaces. Pt sleeps in a lift chair at home. [name] helps him with all ADLs prn. Spoke with pt about a 3n1 so that he would not have to stand and walk into bathroom, but he declined.    Extremity/Trunk  Assessment Upper Extremity Assessment Upper Extremity Assessment: Overall WFL for tasks assessed            Vision Baseline Vision/History: 1 Wears glasses Ability to See in Adequate Light: 0 Adequate Patient Visual Report: No change from baseline           Communication Communication Communication: No apparent difficulties   Cognition Arousal: Alert Behavior During Therapy: WFL for tasks assessed/performed Cognition: No apparent impairments                               Following commands: Intact        Cueing   Cueing Techniques: Verbal cues             Pertinent Vitals/ Pain       Pain Assessment Pain Assessment: Faces Faces Pain Scale: Hurts a little bit Pain Location: R foot with ambulation Pain Descriptors / Indicators: Sore, Discomfort Pain Intervention(s): Limited activity within patient's tolerance, Monitored during session, Repositioned         Frequency  Min 1X/week        Progress Toward Goals  OT Goals(current goals can now be found in the care plan section)  Progress towards OT goals: Progressing toward goals  Acute Rehab OT Goals Patient Stated Goal: to go home tomorrow OT Goal Formulation: With patient Time For Goal Achievement: 04/15/23 Potential to Achieve Goals: Good  Plan         AM-PAC OT "6 Clicks" Daily Activity     Outcome Measure   Help from another person eating meals?: None Help from another person taking care of personal grooming?: A Little Help from another person toileting, which includes using toliet, bedpan, or urinal?: A Lot Help from another person bathing (including washing, rinsing, drying)?: A Lot Help from another person to put on and taking off regular upper body clothing?: A Little Help from another person to put on and taking off regular lower body clothing?: Total 6 Click Score: 15    End of Session Equipment Utilized During Treatment: Gait belt;Rolling walker (2 wheels)  OT Visit  Diagnosis: Unsteadiness on feet (R26.81);Other abnormalities of gait and mobility (R26.89);Muscle weakness (generalized) (M62.81);Pain Pain - Right/Left: Right Pain - part of body: Ankle and joints of foot   Activity Tolerance Patient tolerated treatment well   Patient Left in chair;with call bell/phone within reach   Nurse Communication Mobility status (RN and NT via secure chat)        Time: 1610-9604 OT Time Calculation (min): 37 min  Charges: OT General Charges $OT Visit: 1 Visit OT Treatments $Self Care/Home Management : 23-37 mins  Lindon Romp OT Acute Rehabilitation Services Office [phone]    Evette Georges 04/04/2023, 3:37 PM

## 2023-04-04 NOTE — Progress Notes (Signed)
PROGRESS NOTE    Harold [name]  ZOX:[id number] DOB: 09-Jun-1955 DOA: 03/27/2023 PCP: [name], MD   Brief Narrative:  Harold [name] is a 68 y.o. male with PMH significant for morbid obesity, OSA, DM2, HTN, HLD, CAD/stent, A-fib on Eliquis, carotid artery stenosis, CKD, chronic anemia, anxiety/depression, diabetic neuropathy, GERD, hypothyroidism, COPD, pulmonary hypertension. 2/6, patient presented to the ED with complaint of cough, subjective fever, myalgia, generalized weakness for 5 days. Respiratory virus panel positive for influenza A. Chest x-ray negative for infiltrates. Out of for Tamiflu.  He improved from fluid with supportive care.   On admission, he was also noted to have right leg cellulitis. Per chart review, in November 2024, he underwent ORIF of the right ankle, with ensuing revision in December 2024.  He has subsequently been followed by wound care at home.  However over the course the last 1 week, patient had progressive right lateral malleolus erythema, swelling, tenderness, with some evidence of purulent drainage, X-ray showed soft tissue swelling most pronounced over the lateral malleolus, consistent with cellulitis, without evidence of subcutaneous gas or crepitus to suggest necrotizing fasciitis.  Orthopedics and vascular surgery consult appreciated. Procedures as below.  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 S/p  right ankle hardware removal, I&D and wound VAC placement -2/10 Dr. Jena Gauss In the setting of uncontrolled diabetes, and possible PAD.  S/p repeat debridement of the right ankle by Dr. Lajoyce Corners 04/02/2023.  Seen by ID [id number], antibiotics transitioned to cefepime and daptomycin.  Seen by PT OT, they recommended SNF, however patient will have to pay out-of-pocket for the co-pay which patient is unable to afford.  Several discussions between myself and TOC with the patient and his significant other, they have decided to take him home when medically ready.  ID is trying to formulate plan for oral antibiotics.     PAD Seen by vascular surgery.2/11, underwent aortogram, right leg angiogram, successful stenting of right superficial femoral artery.  Patient on Plavix and Eliquis which was resumed on 04/03/2023 after cleared by orthopedics.   Acute diarrhea Resolved   Influenza A: Presented with 5 days of cough, fever, myalgia. Respiratory virus panel positive for influenza A. Chest x-ray without acute infiltrate. No fever, no WBC count Was outside the 48-hour window for Tamiflu and hence was not initiated.  Doing well on supportive care.  Type 2 diabetes mellitus / Diabetic neuropathy, POA: A1c 7 on 01/02/2023. PTA meds-Tresiba 60 units daily, Mounjaro Currently on Lantus 30 units daily and SSI/Accu-Cheks.  Blood sugar very well-controlled. PTA on gabapentin 1600 mg 3 times daily???.  Seems very high-dose.  Unable to confirm from the patient. Currently on reduced dose of gabapentin at 400 mg 3 times daily.  AKI on CKD 4 Baseline creatinine 2.13 from December 2024.  Creatinine worsened to peak at 2.89 on 2/9, gradually improved and back to baseline now.  Avoid nephrotoxic agents.  Hypokalemia: 3.0.  Will  replenish.  Recheck in the morning.  Paroxysmal A-fib Rates controlled on PTA meds Coreg, amiodarone and Eliquis.   Essential hypertension PTA meds- Coreg 3.125 mg twice daily, Lasix 160 mg daily, currently  on home dose of Coreg but Lasix 40 mg p.o. twice daily, blood pressure still elevated.  Will increase Lasix to 80 mg twice daily.  He has bradycardia time so cannot increase Coreg dose.  Will add amlodipine 5 mg.  Continue as needed hydralazine.   CAD/stent Carotid artery stenosis HLD Continue PTA meds -Eliquis, Plavix, Crestor   Hypothyroidism Continue Synthroid   Morbid Obesity  Body mass index is 39.99 kg/m. Patient has been advised to make an attempt to improve diet and exercise patterns to aid in weight loss.   Obstructive sleep apnea Patient reported good compliance on home nocturnal CPAP   Gout Allopurinol, colchicine   Depression Cymbalta  DVT prophylaxis: SCDs Start: 04/02/23 1617 SCDs Start: 03/31/23 1620 SCDs Start: 03/27/23 2243   Code Status: Full Code  Family Communication:  None present at bedside.  Plan of care discussed with patient in length and he/she verbalized understanding and agreed with it.  Status is: Inpatient Remains inpatient appropriate because: Starting on Eliquis, needs to watch overnight for excessive bleeding, also needs to be seen by ID and he needs placement to SNF.  He should be ready for discharge by tomorrow from medical perspective.   Estimated body mass index is 39.06 kg/m as calculated from the following:   Height as of this encounter: 6\' 1"  (1.854 m).   Weight as of this encounter: 134.3 kg.    Nutritional Assessment: Body mass index is 39.06 kg/m.Marland Kitchen Seen by dietician.  I agree with the assessment and plan as outlined below: Nutrition Status:        . Skin Assessment: I have examined the patient's skin and I agree with the wound assessment as performed by the wound care RN as outlined below:    Consultants:  Vascular surgery, orthopedics and ID  Procedures:  As above  Antimicrobials:  Anti-infectives (From admission, onward)    Start     Dose/Rate Route Frequency Ordered Stop   04/04/23 1400  DAPTOmycin (CUBICIN)  IVPB 700 mg/144mL premix        700 mg 200 mL/hr over 30 Minutes Intravenous Daily 04/03/23 1435     04/02/23 1715  ceFAZolin (ANCEF) IVPB 2g/100 mL premix  Status:  Discontinued        2 g 200 mL/hr over 30 Minutes Intravenous Every 8 hours 04/02/23 1616 04/02/23 1623   04/02/23 0600  ceFAZolin (ANCEF) IVPB 3g/150 mL premix  Status:  Discontinued        3 g 300 mL/hr over 30 Minutes Intravenous On call to O.R. 04/01/23 1700 04/02/23 1549   03/31/23 1015  ceFAZolin (ANCEF) IVPB 2g/100 mL premix        2 g 200 mL/hr over 30 Minutes Intravenous On call to O.R. 03/31/23 0921 03/31/23 1203   03/29/23 2200  vancomycin (VANCOREADY) IVPB 2000 mg/400 mL  Status:  Discontinued       Placed in "Followed by" Linked Group   2,000 mg 200 mL/hr over 120 Minutes Intravenous Every 48 hours 03/27/23 2257 04/03/23 1435   03/28/23 1045  metroNIDAZOLE (FLAGYL) tablet 500 mg  Status:  Discontinued        500 mg Oral Every 12 hours 03/28/23 0955 04/03/23 0945   03/27/23 2330  vancomycin (VANCOCIN) 2,500 mg in sodium chloride 0.9 % 500  mL IVPB       Placed in "Followed by" Linked Group   2,500 mg 262.5 mL/hr over 120 Minutes Intravenous  Once 03/27/23 2257 03/28/23 0241   03/27/23 2300  metroNIDAZOLE (FLAGYL) IVPB 500 mg  Status:  Discontinued        500 mg 100 mL/hr over 60 Minutes Intravenous Every 12 hours 03/27/23 2247 03/28/23 0955   03/27/23 2300  ceFEPIme (MAXIPIME) 2 g in sodium chloride 0.9 % 100 mL IVPB        2 g 200 mL/hr over 30 Minutes Intravenous Every 12 hours 03/27/23 2257     03/27/23 2100  ceFAZolin (ANCEF) IVPB 2g/100 mL premix        2 g 200 mL/hr over 30 Minutes Intravenous  Once 03/27/23 2058 03/27/23 2219         Subjective: Patient seen and examined.  He has no complaints.  Spoke to his significant other Harold [name], he confirmed that the plan is for the patient to come home however he mentioned that he is suffering from influenza A and diarrhea secondary to that and he is himself  too weak at this point in time to care for Harold [name].  He is requesting to keep him in the hospital until he gets better in next 1 to 2 days.  That seems to be very reasonable.  If patient were to discharge today, with no care at home and requiring 2 people, he will be at high risk of readmission.  Also, his blood pressure needs to be better controlled.  Objective: Vitals:   04/04/23 0454 04/04/23 0500 04/04/23 0759 04/04/23 1153  BP: (!) 164/71  (!) 184/68 (!) 168/65  Pulse: (!) 50     Resp: 20  16 15   Temp: 98.2 F (36.8 C)  99.4 F (37.4 C) 98.4 F (36.9 C)  TempSrc: Axillary  Axillary Axillary  SpO2: 99%  97%   Weight:  134.3 kg    Height:        Intake/Output Summary (Last 24 hours) at 04/04/2023 1319 Last data filed at 04/03/2023 2300 Gross per 24 hour  Intake --  Output 2245 ml  Net -2245 ml   Filed Weights   04/02/23 0415 04/03/23 0327 04/04/23 0500  Weight: (!) 137.5 kg (!) 137.9 kg 134.3 kg    Examination:  General exam: Appears calm and comfortable  Respiratory system: Clear to auscultation. Respiratory effort normal. Cardiovascular system: S1 & S2 heard, RRR. No JVD, murmurs, rubs, gallops or clicks. No pedal edema. Gastrointestinal system: Abdomen is nondistended, soft and nontender. No organomegaly or masses felt. Normal bowel sounds heard. Central nervous system: Alert and oriented. No focal neurological deficits. Extremities: Dressing in wound VAC attached to the right lower extremity. Psychiatry: Judgement and insight appear normal. Mood & affect appropriate.   Data Reviewed: I have personally reviewed following labs and imaging studies  CBC: Recent Labs  Lab 03/29/23 0407 03/30/23 0501 04/01/23 0529 04/02/23 0456 04/03/23 0455  WBC 6.4 7.7 8.4 7.1 8.4  NEUTROABS 4.0 4.5 6.0 5.0 5.6  HGB 11.7* 11.3* 11.5* 10.6* 10.6*  HCT 33.7* 32.1* 33.3* 31.5* 31.1*  MCV 84.9 84.3 85.6 85.6 85.7  PLT 161 192 211 210 230   Basic Metabolic Panel: Recent Labs  Lab  03/31/23 0828 04/01/23 0529 04/02/23 0456 04/03/23 0455 04/04/23 0839  NA 138 139 138 140 135  K 3.5 3.6 3.5 3.6 3.0*  CL 107 107 105 107 104  CO2 22 20* 20* 21* 21*  GLUCOSE 169* 114* 139* 116* 150*  BUN 26* 22 20 18 19   CREATININE 2.58* 2.28* 2.21* 2.24* 2.08*  CALCIUM 8.7* 8.8* 9.0 9.2 8.7*   GFR: Estimated Creatinine Clearance: 48.2 mL/min (A) (by C-G formula based on SCr of 2.08 mg/dL (H)). Liver Function Tests: No results for input(s): "AST", "ALT", "ALKPHOS", "BILITOT", "PROT", "ALBUMIN" in the last 168 hours.  No results for input(s): "LIPASE", "AMYLASE" in the last 168 hours. No results for input(s): "AMMONIA" in the last 168 hours. Coagulation Profile: No results for input(s): "INR", "PROTIME" in the last 168 hours.  Cardiac Enzymes: Recent Labs  Lab 04/04/23 0839  CKTOTAL 37*   BNP (last 3 results) No results for input(s): "PROBNP" in the last 8760 hours. HbA1C: No results for input(s): "HGBA1C" in the last 72 hours. CBG: Recent Labs  Lab 04/03/23 1631 04/04/23 0024 04/04/23 0558 04/04/23 0814 04/04/23 1151  GLUCAP 139* 149* 125* 148* 163*   Lipid Profile: Recent Labs    04/02/23 0456  CHOL 84  HDL 24*  LDLCALC 28  TRIG 086*  CHOLHDL 3.5   Thyroid Function Tests: No results for input(s): "TSH", "T4TOTAL", "FREET4", "T3FREE", "THYROIDAB" in the last 72 hours. Anemia Panel: No results for input(s): "VITAMINB12", "FOLATE", "FERRITIN", "TIBC", "IRON", "RETICCTPCT" in the last 72 hours. Sepsis Labs: No results for input(s): "PROCALCITON", "LATICACIDVEN" in the last 168 hours.   Recent Results (from the past 240 hours)  Resp panel by RT-PCR (RSV, Flu A&B, Covid) Anterior Nasal Swab     Status: Abnormal   Collection Time: 03/27/23  6:33 PM   Specimen: Anterior Nasal Swab  Result Value Ref Range Status   SARS Coronavirus 2 by RT PCR NEGATIVE NEGATIVE Final   Influenza A by PCR POSITIVE (A) NEGATIVE Final   Influenza B by PCR NEGATIVE NEGATIVE  Final    Comment: (NOTE) The Xpert Xpress SARS-CoV-2/FLU/RSV plus assay is intended as an aid in the diagnosis of influenza from Nasopharyngeal swab specimens and should not be used as a sole basis for treatment. Nasal washings and aspirates are unacceptable for Xpert Xpress SARS-CoV-2/FLU/RSV testing.  Fact Sheet for Patients: BloggerCourse.com  Fact Sheet for Healthcare Providers: SeriousBroker.it  This test is not yet approved or cleared by the [place] FDA and has been authorized for detection and/or diagnosis of SARS-CoV-2 by FDA under an Emergency Use Authorization (EUA). This EUA will remain in effect (meaning this test can be used) for the duration of the COVID-19 declaration under Section 564(b)(1) of the Act, 21 U.S.C. section 360bbb-3(b)(1), unless the authorization is terminated or revoked.     Resp Syncytial Virus by PCR NEGATIVE NEGATIVE Final    Comment: (NOTE) Fact Sheet for Patients: BloggerCourse.com  Fact Sheet for Healthcare Providers: SeriousBroker.it  This test is not yet approved or cleared by the [place] FDA and has been authorized for detection and/or diagnosis of SARS-CoV-2 by FDA under an Emergency Use Authorization (EUA). This EUA will remain in effect (meaning this test can be used) for the duration of the COVID-19 declaration under Section 564(b)(1) of the Act, 21 U.S.C. section 360bbb-3(b)(1), unless the authorization is terminated or revoked.  Performed at [facility], [address] [place], [place], [place] [address]   Respiratory (~20 pathogens) panel by PCR     Status: Abnormal   Collection Time: 03/28/23  3:15 PM   Specimen: Nasopharyngeal Swab; Respiratory  Result Value Ref Range Status   Adenovirus NOT DETECTED NOT DETECTED Final   Coronavirus 229E NOT DETECTED  NOT DETECTED Final    Comment: (NOTE) The Coronavirus on the  Respiratory Panel, DOES NOT test for the novel  Coronavirus (2019 nCoV)    Coronavirus HKU1 NOT DETECTED NOT DETECTED Final   Coronavirus NL63 NOT DETECTED NOT DETECTED Final   Coronavirus OC43 NOT DETECTED NOT DETECTED Final   Metapneumovirus NOT DETECTED NOT DETECTED Final   Rhinovirus / Enterovirus NOT DETECTED NOT DETECTED Final   Influenza A H3 DETECTED (A) NOT DETECTED Final   Influenza B NOT DETECTED NOT DETECTED Final   Parainfluenza Virus 1 NOT DETECTED NOT DETECTED Final   Parainfluenza Virus 2 NOT DETECTED NOT DETECTED Final   Parainfluenza Virus 3 NOT DETECTED NOT DETECTED Final   Parainfluenza Virus 4 NOT DETECTED NOT DETECTED Final   Respiratory Syncytial Virus NOT DETECTED NOT DETECTED Final   Bordetella pertussis NOT DETECTED NOT DETECTED Final   Bordetella Parapertussis NOT DETECTED NOT DETECTED Final   Chlamydophila pneumoniae NOT DETECTED NOT DETECTED Final   Mycoplasma pneumoniae NOT DETECTED NOT DETECTED Final    Comment: Performed at [facility], [address] [place], [place] [address]  Surgical pcr screen     Status: None   Collection Time: 03/31/23 10:15 AM   Specimen: Nasal Mucosa; Nasal Swab  Result Value Ref Range Status   MRSA, PCR NEGATIVE NEGATIVE Final   Staphylococcus aureus NEGATIVE NEGATIVE Final    Comment: (NOTE) The Xpert SA Assay (FDA approved for NASAL specimens in patients 61 years of age and older), is one component of a comprehensive surveillance program. It is not intended to diagnose infection nor to guide or monitor treatment. Performed at [facility], [address] [place], [place] [address]   Aerobic/Anaerobic Culture w Gram Stain (surgical/deep wound)     Status: None (Preliminary result)   Collection Time: 03/31/23 11:54 AM   Specimen: Path fluid; Tissue  Result Value Ref Range Status   Specimen Description WOUND RIGHT ANKLE  Final   Special Requests NONE  Final   Gram Stain   Final    RARE WBC PRESENT,  PREDOMINANTLY PMN NO ORGANISMS SEEN    Culture   Final    RARE STAPHYLOCOCCUS EPIDERMIDIS RARE ENTEROBACTER CLOACAE NO ANAEROBES ISOLATED; CULTURE IN PROGRESS FOR 5 DAYS    Report Status PENDING  Incomplete   Organism ID, Bacteria STAPHYLOCOCCUS EPIDERMIDIS  Final   Organism ID, Bacteria ENTEROBACTER CLOACAE  Final      Susceptibility   Enterobacter cloacae - MIC*    CEFEPIME <=0.12 SENSITIVE Sensitive     CEFTAZIDIME <=1 SENSITIVE Sensitive     CIPROFLOXACIN <=0.25 SENSITIVE Sensitive     GENTAMICIN <=1 SENSITIVE Sensitive     IMIPENEM 1 SENSITIVE Sensitive     TRIMETH/SULFA <=20 SENSITIVE Sensitive     PIP/TAZO <=4 SENSITIVE Sensitive ug/mL    * RARE ENTEROBACTER CLOACAE   Staphylococcus epidermidis - MIC*    CIPROFLOXACIN <=0.5 SENSITIVE Sensitive     ERYTHROMYCIN >=8 RESISTANT Resistant     GENTAMICIN <=0.5 SENSITIVE Sensitive     OXACILLIN >=4 RESISTANT Resistant     TETRACYCLINE >=16 RESISTANT Resistant     VANCOMYCIN 2 SENSITIVE Sensitive     TRIMETH/SULFA 160 RESISTANT Resistant     CLINDAMYCIN RESISTANT Resistant     RIFAMPIN <=0.5 SENSITIVE Sensitive     Inducible Clindamycin POSITIVE Resistant     LINEZOLID Value in next row Sensitive      2 SENSITIVEPerformed at [facility], [address]  [place], [place] [address]    * RARE STAPHYLOCOCCUS EPIDERMIDIS  Aerobic/Anaerobic Culture w Gram Stain (surgical/deep wound)     Status: None (Preliminary result)   Collection Time: 04/02/23  2:27 PM   Specimen: Wound; Tissue  Result Value Ref Range Status   Specimen Description WOUND RIGHT ANKLE  Final   Special Requests NONE  Final   Gram Stain NO WBC SEEN NO ORGANISMS SEEN   Final   Culture   Final    NO GROWTH 2 DAYS NO ANAEROBES ISOLATED; CULTURE IN PROGRESS FOR 5 DAYS Performed at [facility], [address] [place], [place] [address]    Report Status PENDING  Incomplete     Radiology Studies: No results found.   Scheduled Meds:  allopurinol   100 mg Oral Daily   amiodarone  200 mg Oral BID   apixaban  5 mg Oral BID   aspirin EC  81 mg Oral Daily   carvedilol  3.125 mg Oral BID WC   clopidogrel  75 mg Oral Q breakfast   docusate sodium  100 mg Oral BID   DULoxetine  60 mg Oral Daily   ezetimibe  10 mg Oral Daily   furosemide  40 mg Oral BID   gabapentin  400 mg Oral TID   guaiFENesin  600 mg Oral BID   insulin aspart  0-9 Units Subcutaneous Q6H   insulin glargine-yfgn  30 Units Subcutaneous Daily   levothyroxine  88 mcg Oral QAC breakfast   rosuvastatin  20 mg Oral QPM   sodium chloride flush  3 mL Intravenous Q12H   sodium chloride flush  3-10 mL Intravenous Q12H   Continuous Infusions:  ceFEPime (MAXIPIME) IV 2 g (04/04/23 0839)   DAPTOmycin       LOS: 8 days   Hughie Closs, MD [facility]  04/04/2023, 1:19 PM   *Please note that this is a verbal dictation therefore any spelling or grammatical errors are due to the "Dragon Medical One" system interpretation.  Please page via Amion and do not message via secure chat for urgent patient care matters. Secure chat can be used for non urgent patient care matters.  How to contact the [facility] Attending or Consulting provider 7A - 7P or covering provider during after hours 7P -7A, for this patient?  Check the care team in [facility] and look for a) attending/consulting [facility] provider listed and b) the [facility] team listed. Page or secure chat 7A-7P. Log into [email or web address] and use [facility] universal password to access. If you do not have the password, please contact the hospital operator. Locate the [facility] provider you are looking for under [facility] and page to a number that you can be directly reached. If you still have difficulty reaching the provider, please page the [facility] (Director on Call) for the Hospitalists listed on amion for assistance.

## 2023-04-04 NOTE — Care Management Important Message (Signed)
Important Message  Patient Details  Name: Harold [name] MRN: [id number] Date of Birth: Aug 10, 1955   Important Message Given:  Yes - Medicare IM     Renie Ora 04/04/2023, 11:25 AM

## 2023-04-04 NOTE — Progress Notes (Addendum)
Patient has drainage from his wound on his left ankle. He just notified me. No orders noted. Notified charge nurse of findings.  No one on call after 4pm. Will continue to monitor

## 2023-04-04 NOTE — Progress Notes (Signed)
[facility]    Date of Admission:  03/27/2023   Total days of antibiotics 2   ID: Harold [name] is a 68 y.o. 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, right (HCC)   Abscess of skin of right ankle   PAD (peripheral artery disease) (HCC)   Cellulitis and abscess of foot    Subjective: Afebrile, some mild discomfort to ankle  Medications:   allopurinol  100 mg Oral Daily   amiodarone  200 mg Oral BID   amLODipine  5 mg Oral Daily   apixaban  5 mg Oral BID   aspirin EC  81 mg Oral Daily   carvedilol  3.125 mg Oral BID WC   clopidogrel  75 mg Oral Q breakfast   docusate sodium  100 mg Oral BID   DULoxetine  60 mg Oral Daily   ezetimibe  10 mg Oral Daily   furosemide  80 mg Oral BID   gabapentin  400 mg Oral TID   guaiFENesin  600 mg Oral BID   insulin aspart  0-9 Units Subcutaneous Q6H   insulin glargine-yfgn  30 Units Subcutaneous Daily   levothyroxine  88 mcg Oral QAC breakfast   potassium chloride  40 mEq Oral Q4H   rosuvastatin  20 mg Oral QPM   sodium chloride flush  3 mL Intravenous Q12H   sodium chloride flush  3-10 mL Intravenous Q12H    Objective: Vital signs in last 24 hours: Temp:  [98.2 F (36.8 C)-99.4 F (37.4 C)] 98.4 F (36.9 C) (02/14 1153) Pulse Rate:  [50-66] 50 (02/14 0454) Resp:  [15-20] 15 (02/14 1153) BP: (164-184)/(65-95) 168/65 (02/14 1153) SpO2:  [95 %-100 %] 97 % (02/14 0759) FiO2 (%):  [21 %] 21 % (02/14 0019) Weight:  [134.3 kg] 134.3 kg (02/14 0500)  Physical Exam  Constitutional: He is oriented to person, place, and time. He appears  well-developed and well-nourished. No distress.  HENT:  Mouth/Throat: Oropharynx is clear and moist. No oropharyngeal exudate.  Cardiovascular: Normal rate, regular rhythm and normal heart sounds. Exam reveals no gallop and no friction rub.  No murmur heard.  Pulmonary/Chest: Effort normal and breath sounds normal. No respiratory distress. He has no wheezes.  Abdominal: Soft. Bowel sounds are normal. He exhibits no distension. There is no tenderness.  Lymphadenopathy:  He has no cervical adenopathy.  Neurological: He is alert and oriented to person, place, and time.  Skin: Skin is warm and dry. No rash noted. No erythema.  Ext: wound vac in place Psychiatric: He has a normal mood and affect. His behavior is normal.    Lab Results Recent Labs    04/02/23 0456 04/03/23 0455 04/04/23 0839  WBC 7.1 8.4  --   HGB 10.6* 10.6*  --   HCT 31.5* 31.1*  --   NA 138 140 135  K 3.5 3.6 3.0*  CL 105 107 104  CO2 20* 21* 21*  BUN 20 18 19   CREATININE 2.21* 2.24* 2.08*   Liver Panel No results for input(s): "PROT", "ALBUMIN", "AST", "ALT", "ALKPHOS", "BILITOT", "BILIDIR", "IBILI"  in the last 72 hours. Sedimentation Rate Recent Labs    04/03/23 1129  ESRSEDRATE 101*   C-Reactive Protein Recent Labs    04/03/23 1129  CRP 4.1*    Microbiology:          Staphylococcus epidermidis Enterobacter cloacae      MIC MIC    CEFEPIME   <=0.12 SENS... Sensitive    CEFTAZIDIME   <=1 SENSITIVE Sensitive    CIPROFLOXACIN <=0.5 SENSI... Sensitive <=0.25 SENS... Sensitive    CLINDAMYCIN RESISTANT Resistant      ERYTHROMYCIN >=8 RESISTANT Resistant      GENTAMICIN <=0.5 SENSI... Sensitive <=1 SENSITIVE Sensitive    IMIPENEM   1 SENSITIVE Sensitive    Inducible Clindamycin POSITIVE Resistant      LINEZOLID  Sensitive 1      OXACILLIN >=4 RESISTANT Resistant      PIP/TAZO   <=4 SENSITI... Sensitive    RIFAMPIN <=0.5 SENSI... Sensitive      TETRACYCLINE >=16 RESIST... Resistant       TRIMETH/SULFA 160 RESISTANT Resistant <=20 SENSIT... Sensitive    VANCOMYCIN 2 SENSITIVE Sensitive       Studies/Results: No results found.   Assessment/Plan: Polymicrobial osteomyelitis of right ankle = will repeat EKG if still elevated QTC then we will have to do IV abtx. We will plan for IR to place central line. We would like to do daptomycin and cefepime for 4 wk since surgery. If unable to afford daptomycin, then we will switch to linezolid oral plus IV cefepime.  Will let home health know to anticipate patient and partner education over the weekend/Monday likely  Ckd = appears stable, we will plan on renally dosing his abtx.  Right ankle wound = continue with ortho recs, and wound vac  [facility] Pager: [phone]  04/04/2023, 3:54 PM

## 2023-04-04 NOTE — Progress Notes (Signed)
   04/04/23 2120  BiPAP/CPAP/SIPAP  $ Non-Invasive Ventilator  Non-Invasive Vent Subsequent  BiPAP/CPAP/SIPAP Pt Type Adult  BiPAP/CPAP/SIPAP DREAMSTATIOND  Mask Type Full face mask  Mask Size Large  Respiratory Rate 18 breaths/min  EPAP 8 cmH2O  FiO2 (%) 21 %  Patient Home Equipment No  Auto Titrate No  CPAP/SIPAP surface wiped down Yes

## 2023-04-04 NOTE — TOC Progression Note (Addendum)
Transition of Care [facility]) - Progression Note    Patient Details  Name: Harold [name] MRN: [id number] Date of Birth: 28-Dec-1955  [facility] CM/SW Contact  Lockie Pares, RN Phone Number: 04/04/2023, 11:49 AM  Clinical Narrative:     Patient is in co-pays and  is active with [facility] for home health in [facility]. Confirming this with  [facility] rep. He will need full support to return home.  1220 Spoke to [place] from [facility], the patient is active with [facility]. Will have full service PT OT AIDE RN, and social work. Orders in chart.  Met with patient at bedside. Discussed that he was still active with [facility] and would have max HH Patient states he has all DME and he is near a bathroom Expected Discharge Plan: Skilled Nursing Facility Barriers to Discharge: Continued Medical Work up  Expected Discharge Plan and Services In-house Referral: Clinical Social Work     Living arrangements for the past 2 months: Single Family Home                                       Social Determinants of Health (SDOH) Interventions SDOH Screenings   Food Insecurity: No Food Insecurity (03/27/2023)  Housing: Low Risk  (03/28/2023)  Transportation Needs: No Transportation Needs (03/27/2023)  Utilities: Not At Risk (03/28/2023)  Social Connections: Unknown (03/28/2023)  Tobacco Use: Medium Risk (04/02/2023)    Readmission Risk Interventions     No data to display

## 2023-04-05 DIAGNOSIS — J101 Influenza due to other identified influenza virus with other respiratory manifestations: Secondary | ICD-10-CM | POA: Diagnosis not present

## 2023-04-05 LAB — GLUCOSE, CAPILLARY
Glucose-Capillary: 134 mg/dL — ABNORMAL HIGH (ref 70–99)
Glucose-Capillary: 140 mg/dL — ABNORMAL HIGH (ref 70–99)
Glucose-Capillary: 143 mg/dL — ABNORMAL HIGH (ref 70–99)
Glucose-Capillary: 144 mg/dL — ABNORMAL HIGH (ref 70–99)
Glucose-Capillary: 180 mg/dL — ABNORMAL HIGH (ref 70–99)
Glucose-Capillary: 184 mg/dL — ABNORMAL HIGH (ref 70–99)
Glucose-Capillary: 222 mg/dL — ABNORMAL HIGH (ref 70–99)

## 2023-04-05 LAB — BASIC METABOLIC PANEL
Anion gap: 10 (ref 5–15)
BUN: 22 mg/dL (ref 8–23)
CO2: 20 mmol/L — ABNORMAL LOW (ref 22–32)
Calcium: 8.9 mg/dL (ref 8.9–10.3)
Chloride: 107 mmol/L (ref 98–111)
Creatinine, Ser: 2.47 mg/dL — ABNORMAL HIGH (ref 0.61–1.24)
GFR, Estimated: 28 mL/min — ABNORMAL LOW (ref >60)
Glucose, Bld: 161 mg/dL — ABNORMAL HIGH (ref 70–99)
Potassium: 3.3 mmol/L — ABNORMAL LOW (ref 3.5–5.1)
Sodium: 137 mmol/L (ref 135–145)

## 2023-04-05 MED ORDER — POTASSIUM CHLORIDE CRYS ER 20 MEQ PO TBCR
40.0000 meq | EXTENDED_RELEASE_TABLET | ORAL | Status: AC
  Administered 2023-04-05 (×2): 40 meq via ORAL
  Filled 2023-04-05 (×2): qty 2

## 2023-04-05 NOTE — Plan of Care (Signed)
  Problem: Activity: Goal: Risk for activity intolerance will decrease Outcome: Progressing   Problem: Elimination: Goal: Will not experience complications related to bowel motility Outcome: Progressing   Problem: Pain Managment: Goal: General experience of comfort will improve and/or be controlled Outcome: Progressing   Problem: Tissue Perfusion: Goal: Adequacy of tissue perfusion will improve Outcome: Progressing   Problem: Education: Goal: Understanding of CV disease, CV risk reduction, and recovery process will improve Outcome: Progressing   Problem: Activity: Goal: Ability to return to baseline activity level will improve Outcome: Progressing   Problem: Cardiovascular: Goal: Ability to achieve and maintain adequate cardiovascular perfusion will improve Outcome: Progressing

## 2023-04-05 NOTE — Progress Notes (Signed)
PROGRESS NOTE    Harold [name]  GNF:[id number] DOB: [birth year] DOA: 03/27/2023 PCP: [name], MD   Brief Narrative:  Harold [name] is a 68 y.o. male with PMH significant for morbid obesity, OSA, DM2, HTN, HLD, CAD/stent, A-fib on Eliquis, carotid artery stenosis, CKD, chronic anemia, anxiety/depression, diabetic neuropathy, GERD, hypothyroidism, COPD, pulmonary hypertension. 2/6, patient presented to the ED with complaint of cough, subjective fever, myalgia, generalized weakness for 5 days. Respiratory virus panel positive for influenza A. Chest x-ray negative for infiltrates. Out of for Tamiflu.  He improved from fluid with supportive care.   On admission, he was also noted to have right leg cellulitis. Per chart review, in November 2024, he underwent ORIF of the right ankle, with ensuing revision in December 2024.  He has subsequently been followed by wound care at home.  However over the course the last 1 week, patient had progressive right lateral malleolus erythema, swelling, tenderness, with some evidence of purulent drainage, X-ray showed soft tissue swelling most pronounced over the lateral malleolus, consistent with cellulitis, without evidence of subcutaneous gas or crepitus to suggest necrotizing fasciitis.  Orthopedics and vascular surgery consult appreciated. Procedures as below.  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 S/p  right ankle hardware removal, I&D and wound VAC placement -2/10 Dr. Jena Gauss In the setting of uncontrolled diabetes, and possible PAD.  S/p repeat debridement of the right ankle by Dr. Lajoyce Corners 04/02/2023.  Seen by ID [id number], antibiotics transitioned to cefepime and daptomycin.  Seen by PT OT, they recommended SNF, however patient will have to pay out-of-pocket for the co-pay which patient is unable to afford.  Several discussions between myself and TOC with the patient and his significant other, they have decided to take him home when medically ready.  Wound culture is growing polymicrobial organisms.  Due to prolonged QTc, ID unable to transition him to oral fluoroquinolones.  They have recommended continuing current cefepime and daptomycin for 4 weeks since surgery.  Recommended central line since PICC line cannot be placed due to CKD.  IR was consulted 04/04/2023 for central line placement.  Patient and his significant other will need teaching for home antibiotics.  TOC has been informed.  Still awaiting central line placement.   PAD Seen by vascular surgery.2/11, underwent aortogram, right leg angiogram, successful stenting of right superficial femoral artery.  Patient on Plavix and Eliquis which was resumed on 04/03/2023 after cleared by orthopedics.   Acute diarrhea Resolved   Influenza A: Presented with 5 days of cough, fever, myalgia. Respiratory virus panel positive for influenza A. Chest x-ray without acute infiltrate. No fever, no WBC count Was outside the 48-hour window for Tamiflu and hence was not initiated.  Doing well on supportive care.  Type 2 diabetes mellitus / Diabetic neuropathy, POA: A1c 7 on 01/02/2023. PTA meds-Tresiba 60 units daily, Mounjaro Currently on Lantus 30 units daily and SSI/Accu-Cheks.  Blood sugar very well-controlled.  PTA on gabapentin 1600 mg 3 times daily???.  Seems very high-dose.  Unable to confirm from the patient. Currently on reduced dose of gabapentin at 400 mg 3  times daily.  AKI on CKD 4 Baseline creatinine 2.13 from December 2024.  Creatinine worsened to peak at 2.89 on 2/9, gradually improved and back to baseline now.  Avoid nephrotoxic agents.  Hypokalemia: Low again, will replenish.  Paroxysmal A-fib Rates controlled on PTA meds Coreg, amiodarone and Eliquis.   Essential hypertension PTA meds- Coreg 3.125 mg twice daily, Lasix 160 mg daily, currently on home dose of Coreg but Lasix 40 mg p.o. twice daily, Lasix increased to 80 mg p.o. twice daily on 04/04/2023 and amlodipine 5 mg added, blood pressure still blood pressure elevated but he has not received morning meds yet.  Will monitor on current dosage.    CAD/stent Carotid artery stenosis HLD Continue PTA meds -Eliquis, Plavix, Crestor   Hypothyroidism Continue Synthroid   Morbid Obesity  Body mass index is 39.99 kg/m. Patient has been advised to make an attempt to improve diet and exercise patterns to aid in weight loss.   Obstructive sleep apnea Patient reported good compliance on home nocturnal CPAP   Gout Allopurinol, colchicine   Depression Cymbalta  DVT prophylaxis: SCDs Start: 04/02/23 1617 SCDs Start: 03/31/23 1620 SCDs Start: 03/27/23 2243   Code Status: Full Code  Family Communication:  None present at bedside.  Plan of care discussed with patient in length and he/she verbalized understanding and agreed with it.  Also updated his partner.  Status is: Inpatient Remains inpatient appropriate because: Patient needs central line which is pending.  Also needs teaching for him and his partner to do home IV antibiotics.   Estimated body mass index is 38.77 kg/m as calculated from the following:   Height as of this encounter: 6\' 1"  (1.854 m).   Weight as of this encounter: 133.3 kg.    Nutritional Assessment: Body mass index is 38.77 kg/m.Marland Kitchen Seen by dietician.  I agree with the assessment and plan as outlined below: Nutrition Status:        . Skin  Assessment: I have examined the patient's skin and I agree with the wound assessment as performed by the wound care RN as outlined below:    Consultants:  Vascular surgery, orthopedics and ID  Procedures:  As above  Antimicrobials:  Anti-infectives (From admission, onward)    Start     Dose/Rate Route Frequency Ordered Stop   04/04/23 1400  DAPTOmycin (CUBICIN) IVPB 700 mg/168mL premix        700 mg 200 mL/hr over 30 Minutes Intravenous Daily 04/03/23 1435     04/02/23 1715  ceFAZolin (ANCEF) IVPB 2g/100 mL premix  Status:  Discontinued        2 g 200 mL/hr over 30 Minutes Intravenous Every 8 hours 04/02/23 1616 04/02/23 1623   04/02/23 0600  ceFAZolin (ANCEF) IVPB 3g/150 mL premix  Status:  Discontinued        3 g 300 mL/hr over 30 Minutes Intravenous On call to O.R. 04/01/23 1700 04/02/23 1549   03/31/23 1015  ceFAZolin (ANCEF) IVPB 2g/100 mL premix        2 g 200 mL/hr over 30 Minutes Intravenous On call to O.R. 03/31/23 6045 03/31/23 1203   03/29/23 2200  vancomycin (VANCOREADY) IVPB 2000 mg/400 mL  Status:  Discontinued       Placed in "Followed by" Linked Group   2,000 mg 200 mL/hr over 120  Minutes Intravenous Every 48 hours 03/27/23 2257 04/03/23 1435   03/28/23 1045  metroNIDAZOLE (FLAGYL) tablet 500 mg  Status:  Discontinued        500 mg Oral Every 12 hours 03/28/23 0955 04/03/23 0945   03/27/23 2330  vancomycin (VANCOCIN) 2,500 mg in sodium chloride 0.9 % 500 mL IVPB       Placed in "Followed by" Linked Group   2,500 mg 262.5 mL/hr over 120 Minutes Intravenous  Once 03/27/23 2257 03/28/23 0241   03/27/23 2300  metroNIDAZOLE (FLAGYL) IVPB 500 mg  Status:  Discontinued        500 mg 100 mL/hr over 60 Minutes Intravenous Every 12 hours 03/27/23 2247 03/28/23 0955   03/27/23 2300  ceFEPIme (MAXIPIME) 2 g in sodium chloride 0.9 % 100 mL IVPB        2 g 200 mL/hr over 30 Minutes Intravenous Every 12 hours 03/27/23 2257     03/27/23 2100  ceFAZolin (ANCEF) IVPB 2g/100 mL  premix        2 g 200 mL/hr over 30 Minutes Intravenous  Once 03/27/23 2058 03/27/23 2219         Subjective: Patient seen and examined.  He has no complaints.  Discussed plan of care with him.  He is agreeable with that.  Objective: Vitals:   04/04/23 1942 04/04/23 2120 04/04/23 2328 04/05/23 0445  BP: (!) 141/77  (!) 177/69 (!) 174/74  Pulse: (!) 116 61 (!) 58 60  Resp: 18  20 18   Temp: 98.8 F (37.1 C)  98.2 F (36.8 C) 98.4 F (36.9 C)  TempSrc: Axillary  Axillary Axillary  SpO2: 100% 100% 99% 98%  Weight:    133.3 kg  Height:        Intake/Output Summary (Last 24 hours) at 04/05/2023 0741 Last data filed at 04/05/2023 0630 Gross per 24 hour  Intake 704.06 ml  Output 200 ml  Net 504.06 ml   Filed Weights   04/03/23 0327 04/04/23 0500 04/05/23 0445  Weight: (!) 137.9 kg 134.3 kg 133.3 kg    Examination:  General exam: Appears calm and comfortable  Respiratory system: Clear to auscultation. Respiratory effort normal. Cardiovascular system: S1 & S2 heard, RRR. No JVD, murmurs, rubs, gallops or clicks. No pedal edema. Gastrointestinal system: Abdomen is nondistended, soft and nontender. No organomegaly or masses felt. Normal bowel sounds heard. Central nervous system: Alert and oriented. No focal neurological deficits. Extremities: Dressing in wound VAC attached to the right lower extremity. Psychiatry: Judgement and insight appear normal. Mood & affect appropriate.   Data Reviewed: I have personally reviewed following labs and imaging studies  CBC: Recent Labs  Lab 03/30/23 0501 04/01/23 0529 04/02/23 0456 04/03/23 0455  WBC 7.7 8.4 7.1 8.4  NEUTROABS 4.5 6.0 5.0 5.6  HGB 11.3* 11.5* 10.6* 10.6*  HCT 32.1* 33.3* 31.5* 31.1*  MCV 84.3 85.6 85.6 85.7  PLT 192 211 210 230   Basic Metabolic Panel: Recent Labs  Lab 04/01/23 0529 04/02/23 0456 04/03/23 0455 04/04/23 0839 04/05/23 0427  NA 139 138 140 135 137  K 3.6 3.5 3.6 3.0* 3.3*  CL 107 105 107  104 107  CO2 20* 20* 21* 21* 20*  GLUCOSE 114* 139* 116* 150* 161*  BUN 22 20 18 19 22   CREATININE 2.28* 2.21* 2.24* 2.08* 2.47*  CALCIUM 8.8* 9.0 9.2 8.7* 8.9   GFR: Estimated Creatinine Clearance: 40.4 mL/min (A) (by C-G formula based on SCr of 2.47 mg/dL (H)). Liver Function Tests:  No results for input(s): "AST", "ALT", "ALKPHOS", "BILITOT", "PROT", "ALBUMIN" in the last 168 hours.  No results for input(s): "LIPASE", "AMYLASE" in the last 168 hours. No results for input(s): "AMMONIA" in the last 168 hours. Coagulation Profile: No results for input(s): "INR", "PROTIME" in the last 168 hours.  Cardiac Enzymes: Recent Labs  Lab 04/04/23 0839  CKTOTAL 37*   BNP (last 3 results) No results for input(s): "PROBNP" in the last 8760 hours. HbA1C: No results for input(s): "HGBA1C" in the last 72 hours. CBG: Recent Labs  Lab 04/04/23 0814 04/04/23 1151 04/04/23 2007 04/04/23 2353 04/05/23 0549  GLUCAP 148* 163* 173* 144* 140*   Lipid Profile: No results for input(s): "CHOL", "HDL", "LDLCALC", "TRIG", "CHOLHDL", "LDLDIRECT" in the last 72 hours.  Thyroid Function Tests: No results for input(s): "TSH", "T4TOTAL", "FREET4", "T3FREE", "THYROIDAB" in the last 72 hours. Anemia Panel: No results for input(s): "VITAMINB12", "FOLATE", "FERRITIN", "TIBC", "IRON", "RETICCTPCT" in the last 72 hours. Sepsis Labs: No results for input(s): "PROCALCITON", "LATICACIDVEN" in the last 168 hours.   Recent Results (from the past 240 hours)  Resp panel by RT-PCR (RSV, Flu A&B, Covid) Anterior Nasal Swab     Status: Abnormal   Collection Time: 03/27/23  6:33 PM   Specimen: Anterior Nasal Swab  Result Value Ref Range Status   SARS Coronavirus 2 by RT PCR NEGATIVE NEGATIVE Final   Influenza A by PCR POSITIVE (A) NEGATIVE Final   Influenza B by PCR NEGATIVE NEGATIVE Final    Comment: (NOTE) The Xpert Xpress SARS-CoV-2/FLU/RSV plus assay is intended as an aid in the diagnosis of influenza from  Nasopharyngeal swab specimens and should not be used as a sole basis for treatment. Nasal washings and aspirates are unacceptable for Xpert Xpress SARS-CoV-2/FLU/RSV testing.  Fact Sheet for Patients: BloggerCourse.com  Fact Sheet for Healthcare Providers: SeriousBroker.it  This test is not yet approved or cleared by the [place] FDA and has been authorized for detection and/or diagnosis of SARS-CoV-2 by FDA under an Emergency Use Authorization (EUA). This EUA will remain in effect (meaning this test can be used) for the duration of the COVID-19 declaration under Section 564(b)(1) of the Act, 21 U.S.C. section 360bbb-3(b)(1), unless the authorization is terminated or revoked.     Resp Syncytial Virus by PCR NEGATIVE NEGATIVE Final    Comment: (NOTE) Fact Sheet for Patients: BloggerCourse.com  Fact Sheet for Healthcare Providers: SeriousBroker.it  This test is not yet approved or cleared by the [place] FDA and has been authorized for detection and/or diagnosis of SARS-CoV-2 by FDA under an Emergency Use Authorization (EUA). This EUA will remain in effect (meaning this test can be used) for the duration of the COVID-19 declaration under Section 564(b)(1) of the Act, 21 U.S.C. section 360bbb-3(b)(1), unless the authorization is terminated or revoked.  Performed at [facility], [address] [place], [place] [address]   Respiratory (~20 pathogens) panel by PCR     Status: Abnormal   Collection Time: 03/28/23  3:15 PM   Specimen: Nasopharyngeal Swab; Respiratory  Result Value Ref Range Status   Adenovirus NOT DETECTED NOT DETECTED Final   Coronavirus 229E NOT DETECTED NOT DETECTED Final    Comment: (NOTE) The Coronavirus on the Respiratory Panel, DOES NOT test for the novel  Coronavirus (2019 nCoV)    Coronavirus HKU1 NOT DETECTED NOT DETECTED Final    Coronavirus NL63 NOT DETECTED NOT DETECTED Final   Coronavirus OC43 NOT DETECTED NOT DETECTED Final   Metapneumovirus NOT  DETECTED NOT DETECTED Final   Rhinovirus / Enterovirus NOT DETECTED NOT DETECTED Final   Influenza A H3 DETECTED (A) NOT DETECTED Final   Influenza B NOT DETECTED NOT DETECTED Final   Parainfluenza Virus 1 NOT DETECTED NOT DETECTED Final   Parainfluenza Virus 2 NOT DETECTED NOT DETECTED Final   Parainfluenza Virus 3 NOT DETECTED NOT DETECTED Final   Parainfluenza Virus 4 NOT DETECTED NOT DETECTED Final   Respiratory Syncytial Virus NOT DETECTED NOT DETECTED Final   Bordetella pertussis NOT DETECTED NOT DETECTED Final   Bordetella Parapertussis NOT DETECTED NOT DETECTED Final   Chlamydophila pneumoniae NOT DETECTED NOT DETECTED Final   Mycoplasma pneumoniae NOT DETECTED NOT DETECTED Final    Comment: Performed at [facility], [address], [place], [place] [address]  Surgical pcr screen     Status: None   Collection Time: 03/31/23 10:15 AM   Specimen: Nasal Mucosa; Nasal Swab  Result Value Ref Range Status   MRSA, PCR NEGATIVE NEGATIVE Final   Staphylococcus aureus NEGATIVE NEGATIVE Final    Comment: (NOTE) The Xpert SA Assay (FDA approved for NASAL specimens in patients 62 years of age and older), is one component of a comprehensive surveillance program. It is not intended to diagnose infection nor to guide or monitor treatment. Performed at [facility], [address], [place], [place] [address]   Aerobic/Anaerobic Culture w Gram Stain (surgical/deep wound)     Status: None (Preliminary result)   Collection Time: 03/31/23 11:54 AM   Specimen: Path fluid; Tissue  Result Value Ref Range Status   Specimen Description WOUND RIGHT ANKLE  Final   Special Requests NONE  Final   Gram Stain   Final    RARE WBC PRESENT, PREDOMINANTLY PMN NO ORGANISMS SEEN    Culture   Final    RARE STAPHYLOCOCCUS EPIDERMIDIS RARE ENTEROBACTER CLOACAE NO ANAEROBES  ISOLATED; CULTURE IN PROGRESS FOR 5 DAYS    Report Status PENDING  Incomplete   Organism ID, Bacteria STAPHYLOCOCCUS EPIDERMIDIS  Final   Organism ID, Bacteria ENTEROBACTER CLOACAE  Final      Susceptibility   Enterobacter cloacae - MIC*    CEFEPIME <=0.12 SENSITIVE Sensitive     CEFTAZIDIME <=1 SENSITIVE Sensitive     CIPROFLOXACIN <=0.25 SENSITIVE Sensitive     GENTAMICIN <=1 SENSITIVE Sensitive     IMIPENEM 1 SENSITIVE Sensitive     TRIMETH/SULFA <=20 SENSITIVE Sensitive     PIP/TAZO <=4 SENSITIVE Sensitive ug/mL    * RARE ENTEROBACTER CLOACAE   Staphylococcus epidermidis - MIC*    CIPROFLOXACIN <=0.5 SENSITIVE Sensitive     ERYTHROMYCIN >=8 RESISTANT Resistant     GENTAMICIN <=0.5 SENSITIVE Sensitive     OXACILLIN >=4 RESISTANT Resistant     TETRACYCLINE >=16 RESISTANT Resistant     VANCOMYCIN 2 SENSITIVE Sensitive     TRIMETH/SULFA 160 RESISTANT Resistant     CLINDAMYCIN RESISTANT Resistant     RIFAMPIN <=0.5 SENSITIVE Sensitive     Inducible Clindamycin POSITIVE Resistant     LINEZOLID Value in next row Sensitive      2 SENSITIVEPerformed at [facility], [address] [place], [place] [address]    * RARE STAPHYLOCOCCUS EPIDERMIDIS  Aerobic/Anaerobic Culture w Gram Stain (surgical/deep wound)     Status: None (Preliminary result)   Collection Time: 04/02/23  2:27 PM   Specimen: Wound; Tissue  Result Value Ref Range Status   Specimen Description WOUND RIGHT ANKLE  Final   Special  Requests NONE  Final   Gram Stain NO WBC SEEN NO ORGANISMS SEEN   Final   Culture   Final    NO GROWTH 2 DAYS NO ANAEROBES ISOLATED; CULTURE IN PROGRESS FOR 5 DAYS Performed at [facility], [address] [place], [place] [address]    Report Status PENDING  Incomplete     Radiology Studies: No results found.   Scheduled Meds:  allopurinol  100 mg Oral Daily   amiodarone  200 mg Oral BID   amLODipine  5 mg Oral Daily   apixaban  5 mg Oral BID   aspirin EC  81 mg Oral  Daily   carvedilol  3.125 mg Oral BID WC   clopidogrel  75 mg Oral Q breakfast   docusate sodium  100 mg Oral BID   DULoxetine  60 mg Oral Daily   ezetimibe  10 mg Oral Daily   furosemide  80 mg Oral BID   gabapentin  400 mg Oral TID   guaiFENesin  600 mg Oral BID   insulin aspart  0-9 Units Subcutaneous Q6H   insulin glargine-yfgn  30 Units Subcutaneous Daily   levothyroxine  88 mcg Oral QAC breakfast   potassium chloride  40 mEq Oral Q4H   rosuvastatin  20 mg Oral QPM   sodium chloride flush  3 mL Intravenous Q12H   sodium chloride flush  3-10 mL Intravenous Q12H   Continuous Infusions:  ceFEPime (MAXIPIME) IV Stopped (04/04/23 2200)   DAPTOmycin 200 mL/hr at 04/05/23 0630     LOS: 9 days   Hughie Closs, MD [facility]  04/05/2023, 7:41 AM   *Please note that this is a verbal dictation therefore any spelling or grammatical errors are due to the "Dragon Medical One" system interpretation.  Please page via Amion and do not message via secure chat for urgent patient care matters. Secure chat can be used for non urgent patient care matters.  How to contact the [facility] Attending or Consulting provider 7A - 7P or covering provider during after hours 7P -7A, for this patient?  Check the care team in [facility] and look for a) attending/consulting [facility] provider listed and b) the [facility] team listed. Page or secure chat 7A-7P. Log into [email or web address] and use [facility]'s universal password to access. If you do not have the password, please contact the hospital operator. Locate the [facility] provider you are looking for under [facility] and page to a number that you can be directly reached. If you still have difficulty reaching the provider, please page the [facility] (Director on Call) for the Hospitalists listed on amion for assistance.

## 2023-04-06 ENCOUNTER — Other Ambulatory Visit: Payer: Self-pay | Admitting: Physician Assistant

## 2023-04-06 DIAGNOSIS — J101 Influenza due to other identified influenza virus with other respiratory manifestations: Secondary | ICD-10-CM | POA: Diagnosis not present

## 2023-04-06 LAB — BASIC METABOLIC PANEL
Anion gap: 13 (ref 5–15)
BUN: 24 mg/dL — ABNORMAL HIGH (ref 8–23)
CO2: 21 mmol/L — ABNORMAL LOW (ref 22–32)
Calcium: 8.8 mg/dL — ABNORMAL LOW (ref 8.9–10.3)
Chloride: 101 mmol/L (ref 98–111)
Creatinine, Ser: 2.3 mg/dL — ABNORMAL HIGH (ref 0.61–1.24)
GFR, Estimated: 30 mL/min — ABNORMAL LOW (ref >60)
Glucose, Bld: 158 mg/dL — ABNORMAL HIGH (ref 70–99)
Potassium: 3.2 mmol/L — ABNORMAL LOW (ref 3.5–5.1)
Sodium: 135 mmol/L (ref 135–145)

## 2023-04-06 LAB — GLUCOSE, CAPILLARY
Glucose-Capillary: 158 mg/dL — ABNORMAL HIGH (ref 70–99)
Glucose-Capillary: 154 mg/dL — ABNORMAL HIGH (ref 70–99)
Glucose-Capillary: 160 mg/dL — ABNORMAL HIGH (ref 70–99)
Glucose-Capillary: 181 mg/dL — ABNORMAL HIGH (ref 70–99)

## 2023-04-06 LAB — MAGNESIUM: Magnesium: 1.6 mg/dL — ABNORMAL LOW (ref 1.7–2.4)

## 2023-04-06 MED ORDER — POTASSIUM CHLORIDE CRYS ER 20 MEQ PO TBCR: 40.0000 meq | EXTENDED_RELEASE_TABLET | ORAL | Status: DC

## 2023-04-06 MED ORDER — POTASSIUM CHLORIDE CRYS ER 20 MEQ PO TBCR
40.0000 meq | EXTENDED_RELEASE_TABLET | ORAL | Status: AC
  Administered 2023-04-06 (×2): 40 meq via ORAL
  Filled 2023-04-06 (×2): qty 2

## 2023-04-06 MED ORDER — MAGNESIUM SULFATE 2 GM/50ML IV SOLN
2.0000 g | Freq: Once | INTRAVENOUS | Status: AC
  Administered 2023-04-06: 2 g via INTRAVENOUS
  Filled 2023-04-06: qty 50

## 2023-04-06 NOTE — Plan of Care (Signed)
  Problem: Education: Goal: Knowledge of General Education information will improve Description: Including pain rating scale, medication(s)/side effects and non-pharmacologic comfort measures Outcome: Progressing   Problem: Health Behavior/Discharge Planning: Goal: Ability to manage health-related needs will improve Outcome: Progressing   Problem: Clinical Measurements: Goal: Ability to maintain clinical measurements within normal limits will improve Outcome: Progressing Goal: Will remain free from infection Outcome: Progressing Goal: Diagnostic test results will improve Outcome: Progressing Goal: Respiratory complications will improve Outcome: Progressing Goal: Cardiovascular complication will be avoided Outcome: Progressing   Problem: Activity: Goal: Risk for activity intolerance will decrease Outcome: Progressing   Problem: Nutrition: Goal: Adequate nutrition will be maintained Outcome: Progressing   Problem: Coping: Goal: Level of anxiety will decrease Outcome: Progressing   Problem: Elimination: Goal: Will not experience complications related to bowel motility Outcome: Progressing Goal: Will not experience complications related to urinary retention Outcome: Progressing   Problem: Pain Managment: Goal: General experience of comfort will improve and/or be controlled Outcome: Progressing   Problem: Safety: Goal: Ability to remain free from injury will improve Outcome: Progressing   Problem: Skin Integrity: Goal: Risk for impaired skin integrity will decrease Outcome: Progressing   Problem: Education: Goal: Ability to describe self-care measures that may prevent or decrease complications (Diabetes Survival Skills Education) will improve Outcome: Progressing Goal: Individualized Educational Video(s) Outcome: Progressing   Problem: Coping: Goal: Ability to adjust to condition or change in health will improve Outcome: Progressing   Problem: Fluid  Volume: Goal: Ability to maintain a balanced intake and output will improve Outcome: Progressing   Problem: Health Behavior/Discharge Planning: Goal: Ability to identify and utilize available resources and services will improve Outcome: Progressing Goal: Ability to manage health-related needs will improve Outcome: Progressing   Problem: Metabolic: Goal: Ability to maintain appropriate glucose levels will improve Outcome: Progressing   Problem: Nutritional: Goal: Maintenance of adequate nutrition will improve Outcome: Progressing Goal: Progress toward achieving an optimal weight will improve Outcome: Progressing   Problem: Skin Integrity: Goal: Risk for impaired skin integrity will decrease Outcome: Progressing   Problem: Tissue Perfusion: Goal: Adequacy of tissue perfusion will improve Outcome: Progressing   Problem: Education: Goal: Understanding of CV disease, CV risk reduction, and recovery process will improve Outcome: Progressing Goal: Individualized Educational Video(s) Outcome: Progressing   Problem: Activity: Goal: Ability to return to baseline activity level will improve Outcome: Progressing   Problem: Cardiovascular: Goal: Ability to achieve and maintain adequate cardiovascular perfusion will improve Outcome: Progressing Goal: Vascular access site(s) Level 0-1 will be maintained Outcome: Progressing   Problem: Health Behavior/Discharge Planning: Goal: Ability to safely manage health-related needs after discharge will improve Outcome: Progressing

## 2023-04-06 NOTE — Progress Notes (Signed)
PROGRESS NOTE    Harold [name]  HKV:[id number] DOB: August 30, 1955 DOA: 03/27/2023 PCP: [name], MD   Brief Narrative:  Harold [name] is a 68 y.o. male with PMH significant for morbid obesity, OSA, DM2, HTN, HLD, CAD/stent, A-fib on Eliquis, carotid artery stenosis, CKD, chronic anemia, anxiety/depression, diabetic neuropathy, GERD, hypothyroidism, COPD, pulmonary hypertension. 2/6, patient presented to the ED with complaint of cough, subjective fever, myalgia, generalized weakness for 5 days. Respiratory virus panel positive for influenza A. Chest x-ray negative for infiltrates. Out of for Tamiflu.  He improved from fluid with supportive care.   On admission, he was also noted to have right leg cellulitis. Per chart review, in November 2024, he underwent ORIF of the right ankle, with ensuing revision in December 2024.  He has subsequently been followed by wound care at home.  However over the course the last 1 week, patient had progressive right lateral malleolus erythema, swelling, tenderness, with some evidence of purulent drainage, X-ray showed soft tissue swelling most pronounced over the lateral malleolus, consistent with cellulitis, without evidence of subcutaneous gas or crepitus to suggest necrotizing fasciitis.  Orthopedics and vascular surgery consult appreciated. Procedures as below.  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 S/p  right ankle hardware removal, I&D and wound VAC placement -2/10 Dr. Jena Gauss In the setting of uncontrolled diabetes, and possible PAD.  S/p repeat debridement of the right ankle by Dr. Lajoyce Corners 04/02/2023.  Seen by ID [id number], antibiotics transitioned to cefepime and daptomycin.  Seen by PT OT, they recommended SNF, however patient will have to pay out-of-pocket for the co-pay which patient is unable to afford.  Several discussions between myself and TOC with the patient and his significant other, they have decided to take him home when medically ready.  Wound culture is growing polymicrobial organisms.  Due to prolonged QTc, ID unable to transition him to oral fluoroquinolones.  They have recommended continuing current cefepime and daptomycin for 4 weeks since surgery.  Recommended central line since PICC line cannot be placed due to CKD.  IR was consulted 04/04/2023 for central line placement.  Patient and his significant other will need teaching for home antibiotics.  TOC has been informed.  Still awaiting central line placement.   PAD Seen by vascular surgery.2/11, underwent aortogram, right leg angiogram, successful stenting of right superficial femoral artery.  Patient on Plavix and Eliquis which was resumed on 04/03/2023 after cleared by orthopedics.   Acute diarrhea Resolved   Influenza A: Presented with 5 days of cough, fever, myalgia. Respiratory virus panel positive for influenza A. Chest x-ray without acute infiltrate. No fever, no WBC count Was outside the 48-hour window for Tamiflu and hence was not initiated.  Doing well on supportive care.  Type 2 diabetes mellitus / Diabetic neuropathy, POA: A1c 7 on 01/02/2023. PTA meds-Tresiba 60 units daily, Mounjaro Currently on Lantus 30 units daily and SSI/Accu-Cheks.  Blood sugar very well-controlled.  PTA on gabapentin 1600 mg 3 times daily???.  Seems very high-dose.  Unable to confirm from the patient. Currently on reduced dose of gabapentin at 400 mg 3  times daily.  AKI on CKD 4 Baseline creatinine 2.13 from December 2024.  Creatinine worsened to peak at 2.89 on 2/9, gradually improved and back to baseline now.  Avoid nephrotoxic agents.  Hypokalemia/hypomagnesemia: Low potassium and magnesium again, will replenish.  Paroxysmal A-fib Rates controlled on PTA meds Coreg, amiodarone and Eliquis.   Essential hypertension PTA meds- Coreg 3.125 mg twice daily, Lasix 160 mg daily, currently on home dose of Coreg but Lasix 40 mg p.o. twice daily, Lasix increased to 80 mg p.o. twice daily on 04/04/2023 and amlodipine 5 mg added, blood pressure still blood pressure elevated but he has not received morning meds yet.  Will monitor on current dosage.    CAD/stent Carotid artery stenosis HLD Continue PTA meds -Eliquis, Plavix, Crestor   Hypothyroidism Continue Synthroid   Morbid Obesity  Body mass index is 39.99 kg/m. Patient has been advised to make an attempt to improve diet and exercise patterns to aid in weight loss.   Obstructive sleep apnea Patient reported good compliance on home nocturnal CPAP   Gout Allopurinol, colchicine   Depression Cymbalta  AKI on CKD stage IIIb: Baseline creatinine appears to be 1.9-2.2.  Creatinine peaked to 2.8 this hospitalization and now back to 2.3 which is very close to his baseline.  Patient on daptomycin.  Will avoid any other nephrotoxic agents.  Monitor labs daily.  DVT prophylaxis: SCDs Start: 04/02/23 1617 SCDs Start: 03/31/23 1620 SCDs Start: 03/27/23 2243   Code Status: Full Code  Family Communication:  None present at bedside.  Plan of care discussed with patient in length and he/she verbalized understanding and agreed with it.  Will update his partner later today.  Status is: Inpatient Remains inpatient appropriate because: Patient needs central line which is pending.  Also needs teaching for him and his partner to do home IV antibiotics.   Estimated body mass index is 38.95 kg/m as  calculated from the following:   Height as of this encounter: 6\' 1"  (1.854 m).   Weight as of this encounter: 133.9 kg.    Nutritional Assessment: Body mass index is 38.95 kg/m.Marland Kitchen Seen by dietician.  I agree with the assessment and plan as outlined below: Nutrition Status:        . Skin Assessment: I have examined the patient's skin and I agree with the wound assessment as performed by the wound care RN as outlined below:    Consultants:  Vascular surgery, orthopedics and ID  Procedures:  As above  Antimicrobials:  Anti-infectives (From admission, onward)    Start     Dose/Rate Route Frequency Ordered Stop   04/04/23 1400  DAPTOmycin (CUBICIN) IVPB 700 mg/14mL premix        700 mg 200 mL/hr over 30 Minutes Intravenous Daily 04/03/23 1435     04/02/23 1715  ceFAZolin (ANCEF) IVPB 2g/100 mL premix  Status:  Discontinued        2 g 200 mL/hr over 30 Minutes Intravenous Every 8 hours 04/02/23 1616 04/02/23 1623   04/02/23 0600  ceFAZolin (ANCEF) IVPB 3g/150 mL premix  Status:  Discontinued        3 g 300 mL/hr over 30 Minutes Intravenous On call to O.R. 04/01/23 1700 04/02/23 1549   03/31/23 1015  ceFAZolin (ANCEF) IVPB 2g/100 mL premix  2 g 200 mL/hr over 30 Minutes Intravenous On call to O.R. 03/31/23 0921 03/31/23 1203   03/29/23 2200  vancomycin (VANCOREADY) IVPB 2000 mg/400 mL  Status:  Discontinued       Placed in "Followed by" Linked Group   2,000 mg 200 mL/hr over 120 Minutes Intravenous Every 48 hours 03/27/23 2257 04/03/23 1435   03/28/23 1045  metroNIDAZOLE (FLAGYL) tablet 500 mg  Status:  Discontinued        500 mg Oral Every 12 hours 03/28/23 0955 04/03/23 0945   03/27/23 2330  vancomycin (VANCOCIN) 2,500 mg in sodium chloride 0.9 % 500 mL IVPB       Placed in "Followed by" Linked Group   2,500 mg 262.5 mL/hr over 120 Minutes Intravenous  Once 03/27/23 2257 03/28/23 0241   03/27/23 2300  metroNIDAZOLE (FLAGYL) IVPB 500 mg  Status:  Discontinued         500 mg 100 mL/hr over 60 Minutes Intravenous Every 12 hours 03/27/23 2247 03/28/23 0955   03/27/23 2300  ceFEPIme (MAXIPIME) 2 g in sodium chloride 0.9 % 100 mL IVPB        2 g 200 mL/hr over 30 Minutes Intravenous Every 12 hours 03/27/23 2257     03/27/23 2100  ceFAZolin (ANCEF) IVPB 2g/100 mL premix        2 g 200 mL/hr over 30 Minutes Intravenous  Once 03/27/23 2058 03/27/23 2219         Subjective: Seen and examined.  He has no complaints.  He has communicated the plan with his significant other/George.  Objective: Vitals:   04/05/23 1943 04/05/23 2350 04/06/23 0410 04/06/23 0610  BP: (!) 152/75 138/72 138/70 (!) 152/70  Pulse: 68 66 67   Resp: 20 18 20    Temp: 98 F (36.7 C)  98.6 F (37 C)   TempSrc: Oral Axillary Axillary   SpO2: 93%  95%   Weight:    133.9 kg  Height:        Intake/Output Summary (Last 24 hours) at 04/06/2023 1242 Last data filed at 04/06/2023 1221 Gross per 24 hour  Intake 635.94 ml  Output 5400 ml  Net -4764.06 ml   Filed Weights   04/04/23 0500 04/05/23 0445 04/06/23 0610  Weight: 134.3 kg 133.3 kg 133.9 kg    Examination:  General exam: Appears calm and comfortable  Respiratory system: Clear to auscultation. Respiratory effort normal. Cardiovascular system: S1 & S2 heard, RRR. No JVD, murmurs, rubs, gallops or clicks.  +1 pitting edema bilateral lower extremity Gastrointestinal system: Abdomen is nondistended, soft and nontender. No organomegaly or masses felt. Normal bowel sounds heard. Central nervous system: Alert and oriented. No focal neurological deficits. Extremities: Dressing in wound VAC attached to the right lower extremity. Psychiatry: Judgement and insight appear normal. Mood & affect appropriate.   Data Reviewed: I have personally reviewed following labs and imaging studies  CBC: Recent Labs  Lab 04/01/23 0529 04/02/23 0456 04/03/23 0455  WBC 8.4 7.1 8.4  NEUTROABS 6.0 5.0 5.6  HGB 11.5* 10.6* 10.6*  HCT 33.3*  31.5* 31.1*  MCV 85.6 85.6 85.7  PLT 211 210 230   Basic Metabolic Panel: Recent Labs  Lab 04/02/23 0456 04/03/23 0455 04/04/23 0839 04/05/23 0427 04/06/23 0810  NA 138 140 135 137 135  K 3.5 3.6 3.0* 3.3* 3.2*  CL 105 107 104 107 101  CO2 20* 21* 21* 20* 21*  GLUCOSE 139* 116* 150* 161* 158*  BUN 20 18 19 22  24*  CREATININE 2.21* 2.24* 2.08* 2.47* 2.30*  CALCIUM 9.0 9.2 8.7* 8.9 8.8*  MG  --   --   --   --  1.6*   GFR: Estimated Creatinine Clearance: 43.5 mL/min (A) (by C-G formula based on SCr of 2.3 mg/dL (H)). Liver Function Tests: No results for input(s): "AST", "ALT", "ALKPHOS", "BILITOT", "PROT", "ALBUMIN" in the last 168 hours.  No results for input(s): "LIPASE", "AMYLASE" in the last 168 hours. No results for input(s): "AMMONIA" in the last 168 hours. Coagulation Profile: No results for input(s): "INR", "PROTIME" in the last 168 hours.  Cardiac Enzymes: Recent Labs  Lab 04/04/23 0839  CKTOTAL 37*   BNP (last 3 results) No results for input(s): "PROBNP" in the last 8760 hours. HbA1C: No results for input(s): "HGBA1C" in the last 72 hours. CBG: Recent Labs  Lab 04/05/23 1635 04/05/23 2048 04/05/23 2350 04/06/23 0409 04/06/23 0610  GLUCAP 184* 222* 180* 158* 154*   Lipid Profile: No results for input(s): "CHOL", "HDL", "LDLCALC", "TRIG", "CHOLHDL", "LDLDIRECT" in the last 72 hours.  Thyroid Function Tests: No results for input(s): "TSH", "T4TOTAL", "FREET4", "T3FREE", "THYROIDAB" in the last 72 hours. Anemia Panel: No results for input(s): "VITAMINB12", "FOLATE", "FERRITIN", "TIBC", "IRON", "RETICCTPCT" in the last 72 hours. Sepsis Labs: No results for input(s): "PROCALCITON", "LATICACIDVEN" in the last 168 hours.   Recent Results (from the past 240 hours)  Resp panel by RT-PCR (RSV, Flu A&B, Covid) Anterior Nasal Swab     Status: Abnormal   Collection Time: 03/27/23  6:33 PM   Specimen: Anterior Nasal Swab  Result Value Ref Range Status    SARS Coronavirus 2 by RT PCR NEGATIVE NEGATIVE Final   Influenza A by PCR POSITIVE (A) NEGATIVE Final   Influenza B by PCR NEGATIVE NEGATIVE Final    Comment: (NOTE) The Xpert Xpress SARS-CoV-2/FLU/RSV plus assay is intended as an aid in the diagnosis of influenza from Nasopharyngeal swab specimens and should not be used as a sole basis for treatment. Nasal washings and aspirates are unacceptable for Xpert Xpress SARS-CoV-2/FLU/RSV testing.  Fact Sheet for Patients: BloggerCourse.com  Fact Sheet for Healthcare Providers: SeriousBroker.it  This test is not yet approved or cleared by the [place] FDA and has been authorized for detection and/or diagnosis of SARS-CoV-2 by FDA under an Emergency Use Authorization (EUA). This EUA will remain in effect (meaning this test can be used) for the duration of the COVID-19 declaration under Section 564(b)(1) of the Act, 21 U.S.C. section 360bbb-3(b)(1), unless the authorization is terminated or revoked.     Resp Syncytial Virus by PCR NEGATIVE NEGATIVE Final    Comment: (NOTE) Fact Sheet for Patients: BloggerCourse.com  Fact Sheet for Healthcare Providers: SeriousBroker.it  This test is not yet approved or cleared by the [place] FDA and has been authorized for detection and/or diagnosis of SARS-CoV-2 by FDA under an Emergency Use Authorization (EUA). This EUA will remain in effect (meaning this test can be used) for the duration of the COVID-19 declaration under Section 564(b)(1) of the Act, 21 U.S.C. section 360bbb-3(b)(1), unless the authorization is terminated or revoked.  Performed at [facility], [address] [place], [place] [address]   Respiratory (~20 pathogens) panel by PCR     Status: Abnormal   Collection Time: 03/28/23  3:15 PM   Specimen: Nasopharyngeal Swab; Respiratory  Result Value Ref Range Status    Adenovirus NOT DETECTED NOT DETECTED Final   Coronavirus 229E NOT DETECTED NOT DETECTED Final  Comment: (NOTE) The Coronavirus on the Respiratory Panel, DOES NOT test for the novel  Coronavirus (2019 nCoV)    Coronavirus HKU1 NOT DETECTED NOT DETECTED Final   Coronavirus NL63 NOT DETECTED NOT DETECTED Final   Coronavirus OC43 NOT DETECTED NOT DETECTED Final   Metapneumovirus NOT DETECTED NOT DETECTED Final   Rhinovirus / Enterovirus NOT DETECTED NOT DETECTED Final   Influenza A H3 DETECTED (A) NOT DETECTED Final   Influenza B NOT DETECTED NOT DETECTED Final   Parainfluenza Virus 1 NOT DETECTED NOT DETECTED Final   Parainfluenza Virus 2 NOT DETECTED NOT DETECTED Final   Parainfluenza Virus 3 NOT DETECTED NOT DETECTED Final   Parainfluenza Virus 4 NOT DETECTED NOT DETECTED Final   Respiratory Syncytial Virus NOT DETECTED NOT DETECTED Final   Bordetella pertussis NOT DETECTED NOT DETECTED Final   Bordetella Parapertussis NOT DETECTED NOT DETECTED Final   Chlamydophila pneumoniae NOT DETECTED NOT DETECTED Final   Mycoplasma pneumoniae NOT DETECTED NOT DETECTED Final    Comment: Performed at [facility], [address] [place], [place] [address]  Surgical pcr screen     Status: None   Collection Time: 03/31/23 10:15 AM   Specimen: Nasal Mucosa; Nasal Swab  Result Value Ref Range Status   MRSA, PCR NEGATIVE NEGATIVE Final   Staphylococcus aureus NEGATIVE NEGATIVE Final    Comment: (NOTE) The Xpert SA Assay (FDA approved for NASAL specimens in patients 20 years of age and older), is one component of a comprehensive surveillance program. It is not intended to diagnose infection nor to guide or monitor treatment. Performed at [facility], [address], [place], [place] [address]   Aerobic/Anaerobic Culture w Gram Stain (surgical/deep wound)     Status: None (Preliminary result)   Collection Time: 03/31/23 11:54 AM   Specimen: Path fluid; Tissue  Result Value Ref  Range Status   Specimen Description WOUND RIGHT ANKLE  Final   Special Requests NONE  Final   Gram Stain   Final    RARE WBC PRESENT, PREDOMINANTLY PMN NO ORGANISMS SEEN    Culture   Final    RARE STAPHYLOCOCCUS EPIDERMIDIS RARE ENTEROBACTER CLOACAE NO ANAEROBES ISOLATED STAPHYLOCOCCUS EPIDERMIDIS Sent to [facility] for further susceptibility testing. Performed at [facility], [address], [place], [place] [address]    Report Status PENDING  Incomplete   Organism ID, Bacteria STAPHYLOCOCCUS EPIDERMIDIS  Final   Organism ID, Bacteria ENTEROBACTER CLOACAE  Final      Susceptibility   Enterobacter cloacae - MIC*    CEFEPIME <=0.12 SENSITIVE Sensitive     CEFTAZIDIME <=1 SENSITIVE Sensitive     CIPROFLOXACIN <=0.25 SENSITIVE Sensitive     GENTAMICIN <=1 SENSITIVE Sensitive     IMIPENEM 1 SENSITIVE Sensitive     TRIMETH/SULFA <=20 SENSITIVE Sensitive     PIP/TAZO <=4 SENSITIVE Sensitive ug/mL    * RARE ENTEROBACTER CLOACAE   Staphylococcus epidermidis - MIC*    CIPROFLOXACIN <=0.5 SENSITIVE Sensitive     ERYTHROMYCIN >=8 RESISTANT Resistant     GENTAMICIN <=0.5 SENSITIVE Sensitive     OXACILLIN >=4 RESISTANT Resistant     TETRACYCLINE >=16 RESISTANT Resistant     VANCOMYCIN 2 SENSITIVE Sensitive     TRIMETH/SULFA 160 RESISTANT Resistant     CLINDAMYCIN RESISTANT Resistant     RIFAMPIN <=0.5 SENSITIVE Sensitive     Inducible Clindamycin POSITIVE Resistant     LINEZOLID 2 SENSITIVE Sensitive     * RARE STAPHYLOCOCCUS EPIDERMIDIS  Aerobic/Anaerobic Culture w Gram Stain (surgical/deep wound)     Status: None (Preliminary result)   Collection Time: 04/02/23  2:27 PM   Specimen: Wound; Tissue  Result Value Ref Range Status   Specimen Description WOUND RIGHT ANKLE  Final   Special Requests NONE  Final   Gram Stain NO WBC SEEN NO ORGANISMS SEEN   Final   Culture   Final    NO GROWTH 4 DAYS NO ANAEROBES ISOLATED; CULTURE IN PROGRESS FOR 5 DAYS Performed at [facility], [address] [place], [place] [address]    Report Status PENDING  Incomplete     Radiology Studies: No results found.   Scheduled Meds:  allopurinol  100 mg Oral Daily   amiodarone  200 mg Oral BID   amLODipine  5 mg Oral Daily   apixaban  5 mg Oral BID   carvedilol  3.125 mg Oral BID WC   clopidogrel  75 mg Oral Q breakfast   docusate sodium  100 mg Oral BID   DULoxetine  60 mg Oral Daily   ezetimibe  10 mg Oral Daily   furosemide  80 mg Oral BID   gabapentin  400 mg Oral TID   guaiFENesin  600 mg Oral BID   insulin aspart  0-9 Units Subcutaneous Q6H   insulin glargine-yfgn  30 Units Subcutaneous Daily   levothyroxine  88 mcg Oral QAC breakfast   potassium chloride  40 mEq Oral Q4H   rosuvastatin  20 mg Oral QPM   sodium chloride flush  3 mL Intravenous Q12H   sodium chloride flush  3-10 mL Intravenous Q12H   Continuous Infusions:  ceFEPime (MAXIPIME) IV Stopped (04/06/23 6045)   DAPTOmycin Stopped (04/05/23 1431)   magnesium sulfate bolus IVPB 2 g (04/06/23 1221)     LOS: 10 days   Hughie Closs, MD [facility]  04/06/2023, 12:42 PM   *Please note that this is a verbal dictation therefore any spelling or grammatical errors are due to the "Dragon Medical One" system interpretation.  Please page via Amion and do not message via secure chat for urgent patient care matters. Secure chat can be used for non urgent patient care matters.  How to contact the [facility] Attending or Consulting provider 7A - 7P or covering provider during after hours 7P -7A, for this patient?  Check the care team in [facility] and look for a) attending/consulting TRH provider listed and b) the [facility] team listed. Page or secure chat 7A-7P. Log into [email or web address] and use [facility]'s universal password to access. If you do not have the password, please contact the hospital operator. Locate the [facility] provider you are looking for under [facility] and page to a number that you can be  directly reached. If you still have difficulty reaching the provider, please page the [facility] (Director on Call) for the Hospitalists listed on amion for assistance.

## 2023-04-07 ENCOUNTER — Inpatient Hospital Stay (HOSPITAL_COMMUNITY): Payer: Medicare Other

## 2023-04-07 ENCOUNTER — Encounter (HOSPITAL_COMMUNITY): Payer: Self-pay

## 2023-04-07 DIAGNOSIS — J101 Influenza due to other identified influenza virus with other respiratory manifestations: Secondary | ICD-10-CM | POA: Diagnosis not present

## 2023-04-07 DIAGNOSIS — M86171 Other acute osteomyelitis, right ankle and foot: Secondary | ICD-10-CM | POA: Diagnosis not present

## 2023-04-07 DIAGNOSIS — B9562 Methicillin resistant Staphylococcus aureus infection as the cause of diseases classified elsewhere: Secondary | ICD-10-CM

## 2023-04-07 DIAGNOSIS — B9689 Other specified bacterial agents as the cause of diseases classified elsewhere: Secondary | ICD-10-CM | POA: Diagnosis not present

## 2023-04-07 HISTORY — PX: IR FLUORO GUIDE CV LINE RIGHT: IMG2283

## 2023-04-07 HISTORY — PX: IR PATIENT EVAL TECH 0-60 MINS: IMG5564

## 2023-04-07 HISTORY — PX: IR US GUIDE VASC ACCESS RIGHT: IMG2390

## 2023-04-07 LAB — AEROBIC/ANAEROBIC CULTURE W GRAM STAIN (SURGICAL/DEEP WOUND)
Culture: NO GROWTH
Gram Stain: NONE SEEN

## 2023-04-07 LAB — BASIC METABOLIC PANEL
Anion gap: 11 (ref 5–15)
BUN: 29 mg/dL — ABNORMAL HIGH (ref 8–23)
CO2: 23 mmol/L (ref 22–32)
Calcium: 9.1 mg/dL (ref 8.9–10.3)
Chloride: 101 mmol/L (ref 98–111)
Creatinine, Ser: 2.59 mg/dL — ABNORMAL HIGH (ref 0.61–1.24)
GFR, Estimated: 26 mL/min — ABNORMAL LOW (ref >60)
Glucose, Bld: 198 mg/dL — ABNORMAL HIGH (ref 70–99)
Potassium: 3.7 mmol/L (ref 3.5–5.1)
Sodium: 135 mmol/L (ref 135–145)

## 2023-04-07 LAB — CBC WITH DIFFERENTIAL/PLATELET
Abs Immature Granulocytes: 0.04 10*3/uL (ref 0.00–0.07)
Basophils Absolute: 0.1 10*3/uL (ref 0.0–0.1)
Basophils Relative: 1 %
Eosinophils Absolute: 0.3 10*3/uL (ref 0.0–0.5)
Eosinophils Relative: 3 %
HCT: 31.5 % — ABNORMAL LOW (ref 39.0–52.0)
Hemoglobin: 10.7 g/dL — ABNORMAL LOW (ref 13.0–17.0)
Immature Granulocytes: 1 %
Lymphocytes Relative: 19 %
Lymphs Abs: 1.5 10*3/uL (ref 0.7–4.0)
MCH: 28.8 pg (ref 26.0–34.0)
MCHC: 34 g/dL (ref 30.0–36.0)
MCV: 84.7 fL (ref 80.0–100.0)
Monocytes Absolute: 0.9 10*3/uL (ref 0.1–1.0)
Monocytes Relative: 11 %
Neutro Abs: 5.4 10*3/uL (ref 1.7–7.7)
Neutrophils Relative %: 65 %
Platelets: 228 10*3/uL (ref 150–400)
RBC: 3.72 MIL/uL — ABNORMAL LOW (ref 4.22–5.81)
RDW: 14 % (ref 11.5–15.5)
WBC: 8.2 10*3/uL (ref 4.0–10.5)
nRBC: 0 % (ref 0.0–0.2)

## 2023-04-07 LAB — MAGNESIUM: Magnesium: 2 mg/dL (ref 1.7–2.4)

## 2023-04-07 LAB — GLUCOSE, CAPILLARY
Glucose-Capillary: 155 mg/dL — ABNORMAL HIGH (ref 70–99)
Glucose-Capillary: 175 mg/dL — ABNORMAL HIGH (ref 70–99)
Glucose-Capillary: 179 mg/dL — ABNORMAL HIGH (ref 70–99)
Glucose-Capillary: 201 mg/dL — ABNORMAL HIGH (ref 70–99)

## 2023-04-07 MED ORDER — TRANEXAMIC ACID-NACL 1000-0.7 MG/100ML-% IV SOLN: 1000.0000 mg | Freq: Once | INTRAVENOUS | Status: DC

## 2023-04-07 MED ORDER — LIDOCAINE HCL 1 % IJ SOLN
INTRAMUSCULAR | Status: AC
  Filled 2023-04-07: qty 20

## 2023-04-07 MED ORDER — SODIUM CHLORIDE 0.9 % IV SOLN
20.0000 ug | INTRAVENOUS | Status: AC
  Administered 2023-04-07: 20 ug via INTRAVENOUS
  Filled 2023-04-07: qty 5

## 2023-04-07 MED ORDER — INSULIN GLARGINE-YFGN 100 UNIT/ML ~~LOC~~ SOLN
35.0000 [IU] | Freq: Every day | SUBCUTANEOUS | Status: DC
  Administered 2023-04-07: 35 [IU] via SUBCUTANEOUS
  Filled 2023-04-07 (×2): qty 0.35

## 2023-04-07 MED ORDER — SODIUM CHLORIDE 0.9 % IV SOLN
INTRAVENOUS | Status: AC | PRN
Start: 2023-04-07 — End: 2023-04-08

## 2023-04-07 NOTE — TOC Progression Note (Addendum)
Transition of Care [facility]) - Progression Note    Patient Details  Name: Harold [name] MRN: [id number] Date of Birth: March 04, 1955  Transition of Care [facility] CM/SW Contact  Graves-Bigelow, Lamar Laundry, RN Phone Number: 04/07/2023, 10:32 AM  Clinical Narrative: Case Manager called Elita Quick with [employer] to make her aware that the patient will need IV antibiotics for home. Pam with [facility] will check on insurance verification and will plan to teach patient and spouse late afternoon today. Staff RN and MD are aware. Pam to contact [facility] as well. No further needs identified at this time.   1037  04-07-23 Prevena Wound Vac to be changed out once the patient is medically stable to transition home.   Expected Discharge Plan: Home w Home Health Services Barriers to Discharge: No Barriers Identified  Expected Discharge Plan and Services In-house Referral: Clinical Social Work Discharge Planning Services: CM Consult Post Acute Care Choice: Home Health Living arrangements for the past 2 months: Single Family Home                 DME Arranged: IV pump/equipment DME Agency:  Tourist information centre manager)       HH Arranged: RN, Disease Management, PT, OT, Nurse's Aide HH Agency: [facility] ([facility])  Social Determinants of Health (SDOH) Interventions SDOH Screenings   Food Insecurity: No Food Insecurity (03/27/2023)  Housing: Low Risk  (03/28/2023)  Transportation Needs: No Transportation Needs (03/27/2023)  Utilities: Not At Risk (03/28/2023)  Social Connections: Unknown (03/28/2023)  Tobacco Use: Medium Risk (04/02/2023)   Readmission Risk Interventions     No data to display

## 2023-04-07 NOTE — Procedures (Signed)
Interventional Radiology Procedure:   Indications: Osteomyelitis and needs antibiotics  Procedure: Placement of tunneled central line  Findings: Right jugular single lumen Powerline, tip at SVC/RA junction.  Length 26 cm.   Complications: None     EBL: Minimal  Plan: Central line is ready to use.   Denver Bentson R. Lowella Dandy, MD  Pager: [phone]

## 2023-04-07 NOTE — Progress Notes (Signed)
Interventional Radiology Brief Note:  Patient continues to bleed despite suture.  PA to bedside.  Patient sitting upright at 30 degrees.  Further elevated to 60.  Dressing removed.  Suture intact.  No bleeding note from suture, however small fresh ooze from underneath insertion site. Held pressure x10 minutes at venipuncture site as well as long tract of the catheter.   Left gauze in place, replaced biopatch, and applied pressure dressing.  Sterile dressing will need to replaced prior to discharge tomorrow and can be applied by bedside RN.   Spoke with Dr. Jacqulyn Bath and PharmD regarding [facility] reversal such as DDAVP.  PharmD to order DDAVP.   Discussed with bedside RN.   Loyce Dys, MS RD PA-C

## 2023-04-07 NOTE — Plan of Care (Signed)
  Problem: Education: Goal: Knowledge of General Education information will improve Description: Including pain rating scale, medication(s)/side effects and non-pharmacologic comfort measures Outcome: Progressing   Problem: Clinical Measurements: Goal: Ability to maintain clinical measurements within normal limits will improve Outcome: Progressing Goal: Cardiovascular complication will be avoided Outcome: Progressing   Problem: Nutrition: Goal: Adequate nutrition will be maintained Outcome: Progressing   Problem: Coping: Goal: Level of anxiety will decrease Outcome: Progressing   Problem: Elimination: Goal: Will not experience complications related to bowel motility Outcome: Progressing Goal: Will not experience complications related to urinary retention Outcome: Progressing   Problem: Pain Managment: Goal: General experience of comfort will improve and/or be controlled Outcome: Progressing   Problem: Safety: Goal: Ability to remain free from injury will improve Outcome: Progressing

## 2023-04-07 NOTE — Plan of Care (Signed)
  Problem: Education: Goal: Knowledge of General Education information will improve Description: Including pain rating scale, medication(s)/side effects and non-pharmacologic comfort measures Outcome: Progressing   Problem: Health Behavior/Discharge Planning: Goal: Ability to manage health-related needs will improve Outcome: Progressing   Problem: Clinical Measurements: Goal: Ability to maintain clinical measurements within normal limits will improve Outcome: Progressing Goal: Will remain free from infection Outcome: Progressing Goal: Diagnostic test results will improve Outcome: Progressing Goal: Respiratory complications will improve Outcome: Progressing Goal: Cardiovascular complication will be avoided Outcome: Progressing   Problem: Activity: Goal: Risk for activity intolerance will decrease Outcome: Progressing   Problem: Nutrition: Goal: Adequate nutrition will be maintained Outcome: Progressing   Problem: Coping: Goal: Level of anxiety will decrease Outcome: Progressing   Problem: Elimination: Goal: Will not experience complications related to bowel motility Outcome: Progressing Goal: Will not experience complications related to urinary retention Outcome: Progressing   Problem: Pain Managment: Goal: General experience of comfort will improve and/or be controlled Outcome: Progressing   Problem: Safety: Goal: Ability to remain free from injury will improve Outcome: Progressing   Problem: Skin Integrity: Goal: Risk for impaired skin integrity will decrease Outcome: Progressing   Problem: Education: Goal: Ability to describe self-care measures that may prevent or decrease complications (Diabetes Survival Skills Education) will improve Outcome: Progressing Goal: Individualized Educational Video(s) Outcome: Progressing   Problem: Coping: Goal: Ability to adjust to condition or change in health will improve Outcome: Progressing   Problem: Fluid  Volume: Goal: Ability to maintain a balanced intake and output will improve Outcome: Progressing   Problem: Health Behavior/Discharge Planning: Goal: Ability to identify and utilize available resources and services will improve Outcome: Progressing Goal: Ability to manage health-related needs will improve Outcome: Progressing   Problem: Metabolic: Goal: Ability to maintain appropriate glucose levels will improve Outcome: Progressing   Problem: Nutritional: Goal: Maintenance of adequate nutrition will improve Outcome: Progressing Goal: Progress toward achieving an optimal weight will improve Outcome: Progressing   Problem: Skin Integrity: Goal: Risk for impaired skin integrity will decrease Outcome: Progressing   Problem: Tissue Perfusion: Goal: Adequacy of tissue perfusion will improve Outcome: Progressing   Problem: Education: Goal: Understanding of CV disease, CV risk reduction, and recovery process will improve Outcome: Progressing Goal: Individualized Educational Video(s) Outcome: Progressing   Problem: Activity: Goal: Ability to return to baseline activity level will improve Outcome: Progressing   Problem: Cardiovascular: Goal: Ability to achieve and maintain adequate cardiovascular perfusion will improve Outcome: Progressing Goal: Vascular access site(s) Level 0-1 will be maintained Outcome: Progressing   Problem: Health Behavior/Discharge Planning: Goal: Ability to safely manage health-related needs after discharge will improve Outcome: Progressing

## 2023-04-07 NOTE — Procedures (Signed)
Patient seen in IR nursing bay for persistent bleeding from the tunneled CVC.   One figure 8 suture was placed, no further oozing of blood noted.  Patient was asked to remain sitting upright as much as he can tolerate.  IR will follow tomorrow for suture removal.    Lynann Bologna Klaira Pesci PA-C 04/07/2023 3:51 PM

## 2023-04-07 NOTE — Progress Notes (Signed)
Pharmacy Antibiotic Note  Harold [name] is a 68 y.o. 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 Plan for daptomycin and cefepime per ID. WBC on last check was 8.4. Last CK was 37 - okay to continue statin for now with recent PV stent. Scr up slightly to 2.59 (CrCl 37 mL/min).   Plan: Continue Cefepime 2g q12hr Continue Daptomycin 700 mg q24hr  Will follow renal function, cultures and clinical progress   Height: 6\' 1"  (185.4 cm) Weight: 128.6 kg (283 lb 8.2 oz) IBW/kg (Calculated) : 79.9  Temp (24hrs), Avg:98.5 F (36.9 C), Min:97.8 F (36.6 C), Max:99.7 F (37.6 C)  Recent Labs  Lab 04/01/23 0529 04/02/23 0456 04/03/23 0455 04/04/23 0839 04/05/23 0427 04/06/23 0810 04/07/23 0411  WBC 8.4 7.1 8.4  --   --   --   --   CREATININE 2.28* 2.21* 2.24* 2.08* 2.47* 2.30* 2.59*    Estimated Creatinine Clearance: 37.8 mL/min (A) (by C-G formula based on SCr of 2.59 mg/dL (H)).    Allergies  Allergen Reactions   Doxycycline Hives and Rash   Septra [Bactrim] Itching   Penicillins Rash    Allergy to All cillin drugs  Ancef given 9/24 with no obvious reaction   Metformin And Related Diarrhea and Nausea Only   Other Nausea And Vomiting    General Anesthesia - sometimes causes nausea and vomiting * OK to use scopolamine patch     Sulfamethoxazole-Trimethoprim Other (See Comments)   Wellbutrin [Bupropion]     Mood Changes    Thank you for allowing pharmacy to participate in this patient's care,  Sherron Monday, PharmD, BCCCP Clinical Pharmacist  Phone: [phone] 04/07/2023 12:11 PM  Please check AMION for all [facility] Pharmacy phone numbers After 10:00 PM, call Main Pharmacy [phone]

## 2023-04-07 NOTE — Progress Notes (Signed)
Physical Therapy Treatment Patient Details Name: Harold [name] MRN: [id number] DOB: February 12, 1956 Today's Date: 04/07/2023   History of Present Illness 68 y.o. male [facility] to [facility]/27/23 with productive cough and generalized weakness. Admitted with influenza A and cellulitis of R foot s/p hardware removal of the R ankle and application of wound vac on 2/10. Prior admit 11/24 for R ankle ORIF and in 12/24 for revision. S/p excisional debridement of right ankle on 2/12. Significant PMH: HTN, HLD, CAD s/p PCI, COPD, DM2, CKD stage IIIb, PVD, gout, OSA, neuropathy, COPD, a-fib, CAD s/p percutaneous coronary angioplasty, angina, obesity    PT Comments  Pt performed STS from raised bed height and elevated recliner chair by the addition of pillow with modA and use of RW. Pt is unsteady in standing with posterior lean and had 1 LOB to the L that required maxA to recover. Pt engaged in stand pivot transfer bed<>chair with mod-maxA x2. Educated pt on how to ambulate with RLE TDWB. He attempted to take a couple of steps forward before c/o "room spinning." Assessed BP once seated. Had pt perform BLE exercises to help raise BP, but that was ineffective and he remained symptomatic, RN notified. PT session limited by symptomatic hypotension and arrival of transport to take pt to IR.  Vitals Seated post-transfer/stepping: BP 95/59 (72)  Seated post-BLE exercises: BP 79/58 (67) Seated with chair reclined and legs elevated: BP 124/61 (75).      If plan is discharge home, recommend the following: A lot of help with bathing/dressing/bathroom;Assistance with cooking/housework;Assist for transportation;Help with stairs or ramp for entrance;Two people to help with walking and/or transfers   Can travel by private vehicle     No  Equipment Recommendations  BSC/3in1;Wheelchair (measurements PT);Wheelchair cushion (measurements PT);Hoyer lift    Recommendations for [employer]       Precautions /  Restrictions Precautions Precautions: Fall Recall of Precautions/Restrictions: Impaired Precaution/Restrictions Comments: Reviewed pt's RLE WBing status. Watch BP Required Braces or Orthoses: Other Brace Other Brace: R CAM boot for ambulation, R splint in bed Restrictions Weight Bearing Restrictions Per Provider Order: Yes RLE Weight Bearing Per Provider Order: Touchdown weight bearing     Mobility  Bed Mobility Overal bed mobility: Needs Assistance Bed Mobility: Sit to Supine, Supine to Sit     Supine to sit: HOB elevated, Used rails, Supervision Sit to supine: HOB elevated, Supervision   General bed mobility comments: Pt brought BLE off EOB, managed trunk, and scooted fwd following education of head-hip relationship. Returned to supine by swinging BLE into bed and easing trunk down. Cued pt to reposition himself center in bed and he used handrails.    Transfers Overall transfer level: Needs assistance Equipment used: Rolling walker (2 wheels) Transfers: Sit to/from Stand Sit to Stand: From elevated surface, Mod assist Stand pivot transfers: From elevated surface, Mod assist, +2 safety/equipment, +2 physical assistance, Max assist         General transfer comment: STS from elevated bed height and recliner chair, pt required modA to power up and VC to remind him of appropriate hand positioning. Initially unsteady with posterior lean. VC for glute squeeze, increased WBing through BUE on RW, and upright posture. Bed>chair transfer to L, pt pivoted on L foot to chair with VC to adhere to TDWB on RLE. Good eccentric control with sitting. Chair>bed transfer to R, pt symptomatic required maxA x2 for safety and PT facilitated pelvis to align with bed.    Ambulation/Gait Ambulation/Gait assistance: Mod assist, +  2 safety/equipment Gait Distance (Feet): 2 Feet Assistive device: Rolling walker (2 wheels)         General Gait Details: Educated pt on how to ambulate with WBing  restriction. Demonstrated controlled hopping technique with RLE intermittently resting on floor, but not accepting weight and increased WBing in BUE to advance LLE. Pt took a couple of steps forward, unsteady with posterior lean, 1 LOB to L that required maxA to correct, and c/o "room spinning." Pt returned to recliner chair and BP was assessed.   Stairs             Wheelchair Mobility     Tilt Bed    Modified Rankin (Stroke Patients Only)       Balance Overall balance assessment: Needs assistance Sitting-balance support: No upper extremity supported, Feet supported Sitting balance-Leahy Scale: Good Sitting balance - Comments: Pt sat EOB with supervision. Postural control: Posterior lean Standing balance support: Bilateral upper extremity supported, Reliant on assistive device for balance Standing balance-Leahy Scale: Poor Standing balance comment: Pt unsteady in standing with posterior lean, modA at trunk, reliance on RW and 1 LOB to the L that required maxA to recover.                            Communication Communication Communication: No apparent difficulties  Cognition Arousal: Alert Behavior During Therapy: WFL for tasks assessed/performed   PT - Cognitive impairments: No apparent impairments                         Following commands: Intact      Cueing Cueing Techniques: Verbal cues, Tactile cues  Exercises General Exercises - Lower Extremity Ankle Circles/Pumps: Seated, AROM, Both, 15 reps Long Arc Quad: Seated, Strengthening, Both, 15 reps Hip Flexion/Marching: Seated, Strengthening, Both, 15 reps    General Comments General comments (skin integrity, edema, etc.): Pt c/o "room spinning", dizziness, lightheadness following attempts to ambulate. Assessed BP: 95/59 (72) when he was SBP was in the 150s early. Completed BLE exercises in recliner chair BP: 79/58 (67). Recliner chair and elevated LE BP: 124/61 (75). Transport arrived for  pt, returned him to bed so he could be taken to IR.      Pertinent Vitals/Pain Pain Assessment Pain Assessment: No/denies pain    Home Living                          Prior Function            PT Goals (current goals can now be found in the care plan section) Acute Rehab PT Goals Patient Stated Goal: Move easier Progress towards PT goals: Progressing toward goals    Frequency    Min 1X/week      PT Plan      Co-evaluation              AM-PAC PT "6 Clicks" Mobility   Outcome Measure  Help needed turning from your back to your side while in a flat bed without using bedrails?: A Lot Help needed moving from lying on your back to sitting on the side of a flat bed without using bedrails?: A Lot Help needed moving to and from a bed to a chair (including a wheelchair)?: Total Help needed standing up from a chair using your arms (e.g., wheelchair or bedside chair)?: Total Help needed to walk in hospital room?: Total Help  needed climbing 3-5 steps with a railing? : Total 6 Click Score: 8    End of Session Equipment Utilized During Treatment: Gait belt Activity Tolerance: Patient limited by fatigue;Other (comment) (Treatment limited secondary to Hypotension) Patient left: in bed;Other (comment) (Transport present) Nurse Communication: Mobility status;Other (comment) (VS response) PT Visit Diagnosis: Other abnormalities of gait and mobility (R26.89);Unsteadiness on feet (R26.81);Muscle weakness (generalized) (M62.81);History of falling (Z91.81);Difficulty in walking, not elsewhere classified (R26.2)     Time: 2956-2130 PT Time Calculation (min) (ACUTE ONLY): 31 min  Charges:    $Gait Training: 8-22 mins $Therapeutic Exercise: 8-22 mins PT General Charges $$ ACUTE PT VISIT: 1 Visit                     Cheri Guppy, PT, DPT Acute Rehabilitation Services Office: [phone] Secure Chat Preferred   Richardson Chiquito 04/07/2023, 1:04 PM

## 2023-04-07 NOTE — Progress Notes (Signed)
Iv team arrived to pt bedside to assess line. IR tech & primary RN at bedside. IR tech holding pressure on pt's CVC and states bleeding has slowed. Quickclot at bedside. Denied need for further IV team interventions at this time. Educated primary rn on when to change dressing if gauze/ quickclot is applied. States will re consult IV team if necessary.

## 2023-04-07 NOTE — Procedures (Signed)
RN called IR because Patient's freshly placed tunneled central venous catheter was bleeding profusely from site. This IR tech went to the floor and held pressure on the site for approximately 10 minutes. Bleeding appears to have stopped. Quikclot was applied to catheter site, and gauze and tegaderm was placed. RN was notified to remove Quikclot after 24 hours.

## 2023-04-07 NOTE — Progress Notes (Signed)
PT went down for central line placement at 1200.  Pt arrived back to unit with profuse bleeding at site of insertion, RN held pressure and IR was paged.  IR tech at bedside 1330, held manual pressure for 10 minutes, new dressing placed with clot former  applied.  1500:  RN assessed site, profuse bleeding again at site. Rn paged IR. Pt back down to IR for suture placement at this time.  Pt returned from IR around 1545, site clean dry intact.  1630: RN assessed site, profuse bleeding again. Firm pressure held at and above site RN paged IR, Rockie Neighbours, PA to come to bedside and assess.  Rockie Neighbours PA held manual pressure for 10 minutes with dressing off. After clotting, new bio patch and tagederm are placed. Kacie sated if this happens again, take dressing off and hold manual pressure for 15-20 minutes above site along the catheter tract. Keep pt sitting  up at least 60 degrees as long as possible and minimal usew of R arm.  Casimiro Needle Bitioni consulted for clotting agent. DDAVP reccommended and given at 1735

## 2023-04-07 NOTE — Progress Notes (Addendum)
[facility]  Date of Admission:  03/27/2023      Total days of antibiotics 11   Daptomycin 2/06 >   Cefepime 2/06 >           ASSESSMENT: Harold [name] is a 68 y.o. male admitted with:   Osteomyelitis Rt Ankle, S/P HW Removal -  Enterobacter Cloacea, MRSE -   Awaiting cost analysis for daptomycin for home administration.  -Follow weekly CK.  Awaiting central line  -Continue IV cefepime and daptomycin.   Vascular Access -  Awaiting IR to place central line.    Flu A -  Recovered without any lingering symptoms    Isolation Precautions -  Flu A positive - clinically recovered. Past 7d precautions for policy -can discontinue droplet precautions.    PLAN: Continue daptomycin + cefepime  Central line to be placed in IR when able  D/C droplet precautions  FU cost for home IV abx  Once picc and cost analysis done - from ID perspective can be discharged.    ADDENDUM 4:53 PM  Cost sorted out for IV daptomycin + cefepime at home $160/week. CVC in placed with IR   OPAT ORDERS:  Diagnosis: HW associated OM of RLE   Culture Result: E Cloacea, MRSE (S-Linezolid)  Allergies  Allergen Reactions   Doxycycline Hives and Rash   Septra [Bactrim] Itching   Penicillins Rash    Allergy to All cillin drugs  Ancef given 9/24 with no obvious reaction   Metformin And Related Diarrhea and Nausea Only   Other Nausea And Vomiting    General Anesthesia - sometimes causes nausea and vomiting * OK to use scopolamine patch     Sulfamethoxazole-Trimethoprim Other (See Comments)   Wellbutrin [Bupropion]     Mood Changes     Discharge antibiotics to be given via PICC line:  Daptomycin + Cefepime IV    Duration: 6 weeks   End Date: 3/24 (from [facility] removal 2/10)  PIC Care Per Protocol with Biopatch Use: Home health RN for IV administration and teaching, line care and labs.    Labs weekly while on IV antibiotics: _x_ CBC with  differential __ BMP **TWICE WEEKLY ON VANCOMYCIN  __ CMP _x_ CRP _x_ ESR __ Vancomycin trough TWICE WEEKLY _x_ CK  __ Please pull PIC at completion of IV antibiotics _x_ Please leave PIC in place until doctor has seen patient or been notified Needs IR To remove   Fax weekly labs to [phone]  Clinic Follow Up Appt: 3/12 @ 9:00 am with Dr. 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, right (HCC)   Abscess of skin of right ankle   PAD (peripheral artery disease) (HCC)   Cellulitis and abscess of foot    allopurinol  100 mg Oral Daily   amiodarone  200 mg Oral BID   amLODipine  5 mg Oral Daily   apixaban  5 mg Oral BID   carvedilol  3.125 mg Oral BID WC   clopidogrel  75 mg Oral Q breakfast   docusate sodium  100 mg Oral BID   DULoxetine  60 mg Oral Daily   ezetimibe  10 mg Oral Daily   furosemide  80 mg Oral BID   gabapentin  400 mg Oral TID   guaiFENesin  600 mg Oral BID   insulin aspart  0-9 Units Subcutaneous Q6H   insulin glargine-yfgn  35 Units Subcutaneous Daily   levothyroxine  88 mcg Oral QAC breakfast   rosuvastatin  20 mg Oral QPM   sodium chloride flush  3 mL Intravenous Q12H   sodium chloride flush  3-10 mL Intravenous Q12H    SUBJECTIVE: Doing well. No complaints today. Pain controlled.    Review of Systems: Review of Systems  Constitutional:  Negative for chills and fever.  Respiratory:  Negative for cough.   Cardiovascular:  Negative for chest pain.  Skin:  Negative for rash.     Allergies  Allergen Reactions   Doxycycline Hives and Rash   Septra [Bactrim] Itching   Penicillins Rash    Allergy to All cillin drugs  Ancef given 9/24 with no  obvious reaction   Metformin And Related Diarrhea and Nausea Only   Other Nausea And Vomiting    General Anesthesia - sometimes causes nausea and vomiting * OK to use scopolamine patch     Sulfamethoxazole-Trimethoprim Other (See Comments)   Wellbutrin [Bupropion]     Mood Changes    OBJECTIVE: Vitals:   04/06/23 1253 04/06/23 2023 04/07/23 0329 04/07/23 0757  BP: 136/71 (!) 149/65 (!) 158/73 (!) 155/67  Pulse:  68 63 63  Resp: 18 18 18 16   Temp: 99.7 F (37.6 C) 97.8 F (36.6 C) 97.8 F (36.6 C) 98.5 F (36.9 C)  TempSrc: Oral Oral Oral Axillary  SpO2:  98% 97% 100%  Weight:   128.6 kg   Height:       Body mass index is 37.4 kg/m.  Physical Exam HENT:     Mouth/Throat:     Mouth: Mucous membranes are moist.     Pharynx: Oropharynx is clear.  Eyes:     General: No scleral icterus.    Conjunctiva/sclera: Conjunctivae normal.     Pupils: Pupils are equal, round, and reactive to light.  Cardiovascular:     Rate and Rhythm: Normal rate and regular rhythm.  Pulmonary:     Effort: Pulmonary effort is normal.     Breath sounds: Normal breath sounds.  Musculoskeletal:        General: Normal range of motion.     Comments: Right foot in vac clean and dry.   Skin:    General: Skin is warm.     Capillary Refill: Capillary refill takes less than 2 seconds.  Neurological:     Mental Status: He is alert and oriented to person, place, and time.     Lab Results Lab Results  Component Value Date   WBC 8.4 04/03/2023   HGB 10.6 (L) 04/03/2023   HCT 31.1 (L) 04/03/2023   MCV 85.7 04/03/2023   PLT 230 04/03/2023    Lab Results  Component Value Date   CREATININE 2.59 (H) 04/07/2023   BUN 29 (H) 04/07/2023   NA 135 04/07/2023   K 3.7 04/07/2023   CL 101 04/07/2023   CO2 23 04/07/2023    Lab Results  Component Value Date   ALT 9 03/28/2023   AST 11 (L) 03/28/2023   GGT 28 02/20/2018   ALKPHOS 63 03/28/2023   BILITOT 0.7 03/28/2023     Microbiology: Recent  Results (from the past 240 hours)  Respiratory (~20 pathogens) panel by PCR     Status: Abnormal   Collection Time: 03/28/23  3:15 PM   Specimen: Nasopharyngeal Swab; Respiratory  Result Value Ref Range Status   Adenovirus NOT DETECTED NOT DETECTED Final   Coronavirus 229E NOT DETECTED NOT DETECTED Final    Comment: (NOTE) The Coronavirus on the Respiratory Panel, DOES NOT test for the novel  Coronavirus (2019 nCoV)    Coronavirus HKU1 NOT DETECTED NOT DETECTED Final   Coronavirus NL63 NOT DETECTED NOT DETECTED Final   Coronavirus OC43 NOT DETECTED NOT DETECTED Final   Metapneumovirus NOT DETECTED NOT DETECTED Final   Rhinovirus / Enterovirus NOT DETECTED NOT DETECTED Final   Influenza A H3 DETECTED (A) NOT DETECTED Final   Influenza B NOT DETECTED NOT DETECTED Final   Parainfluenza Virus 1 NOT DETECTED NOT DETECTED Final   Parainfluenza Virus 2 NOT DETECTED NOT DETECTED Final   Parainfluenza Virus 3 NOT DETECTED NOT DETECTED Final   Parainfluenza Virus 4 NOT DETECTED NOT DETECTED Final   Respiratory Syncytial Virus NOT DETECTED NOT DETECTED Final   Bordetella pertussis NOT DETECTED NOT DETECTED Final   Bordetella Parapertussis NOT DETECTED NOT DETECTED Final   Chlamydophila pneumoniae NOT DETECTED NOT DETECTED Final   Mycoplasma pneumoniae NOT DETECTED NOT DETECTED Final    Comment: Performed at [facility], [address], [place], [place] [address]  Surgical pcr screen     Status: None   Collection Time: 03/31/23 10:15 AM   Specimen: Nasal Mucosa; Nasal Swab  Result Value Ref Range Status   MRSA, PCR NEGATIVE NEGATIVE Final   Staphylococcus aureus NEGATIVE NEGATIVE Final    Comment: (NOTE) The Xpert SA Assay (FDA approved for NASAL specimens in patients 82 years of age and older), is one component of a comprehensive surveillance program. It is not intended to diagnose infection nor to guide or monitor treatment. Performed at [facility], [address],  [place], [place] [address]   Aerobic/Anaerobic Culture w Gram Stain (surgical/deep wound)     Status: None (Preliminary result)   Collection Time: 03/31/23 11:54 AM   Specimen: Path fluid; Tissue  Result Value Ref Range Status   Specimen Description WOUND RIGHT ANKLE  Final   Special Requests NONE  Final   Gram Stain   Final    RARE WBC PRESENT, PREDOMINANTLY PMN NO ORGANISMS SEEN    Culture   Final    RARE STAPHYLOCOCCUS EPIDERMIDIS RARE ENTEROBACTER CLOACAE NO ANAEROBES ISOLATED STAPHYLOCOCCUS EPIDERMIDIS Sent to [facility] for further susceptibility testing. Performed at [facility], [address] [place], [place] [address]    Report Status PENDING  Incomplete   Organism ID, Bacteria STAPHYLOCOCCUS EPIDERMIDIS  Final   Organism ID, Bacteria ENTEROBACTER CLOACAE  Final      Susceptibility   Enterobacter cloacae - MIC*    CEFEPIME <=0.12 SENSITIVE Sensitive     CEFTAZIDIME <=1 SENSITIVE Sensitive     CIPROFLOXACIN <=0.25 SENSITIVE Sensitive     GENTAMICIN <=1 SENSITIVE Sensitive     IMIPENEM 1 SENSITIVE Sensitive     TRIMETH/SULFA <=20 SENSITIVE Sensitive     PIP/TAZO <=4 SENSITIVE Sensitive ug/mL    * RARE ENTEROBACTER CLOACAE   Staphylococcus epidermidis - MIC*    CIPROFLOXACIN <=0.5 SENSITIVE Sensitive     ERYTHROMYCIN >=8 RESISTANT Resistant     GENTAMICIN <=0.5 SENSITIVE Sensitive     OXACILLIN >=4 RESISTANT Resistant     TETRACYCLINE >=16 RESISTANT Resistant  VANCOMYCIN 2 SENSITIVE Sensitive     TRIMETH/SULFA 160 RESISTANT Resistant     CLINDAMYCIN RESISTANT Resistant     RIFAMPIN <=0.5 SENSITIVE Sensitive     Inducible Clindamycin POSITIVE Resistant     LINEZOLID 2 SENSITIVE Sensitive     * RARE STAPHYLOCOCCUS EPIDERMIDIS  Aerobic/Anaerobic Culture w Gram Stain (surgical/deep wound)     Status: None (Preliminary result)   Collection Time: 04/02/23  2:27 PM   Specimen: Wound; Tissue  Result Value Ref Range Status   Specimen Description WOUND RIGHT ANKLE   Final   Special Requests NONE  Final   Gram Stain NO WBC SEEN NO ORGANISMS SEEN   Final   Culture   Final    NO GROWTH 4 DAYS NO ANAEROBES ISOLATED; CULTURE IN PROGRESS FOR 5 DAYS Performed at [facility], [address], [place], [place] [address]    Report Status PENDING  Incomplete  MIC (1 Drug)-R-ankle wound; 03/31/2023; Wound; MRSE; Daptomycin     Status: None   Collection Time: 04/04/23  4:30 PM   Specimen: Wound  Result Value Ref Range Status   Min Inhibitory Conc (1 Drug) Preliminary report  Final    Comment: (NOTE) Performed At: [facility] [address] [place], [place] [id number] Jolene Schimke MD XB:[id number]    Source ANKLE  Final    Comment: Performed at [facility], [address] [place], [place] [address]    Rexene Alberts, MSN, NP-C [facility]  [email or web address] Pager: [phone] Office: [phone] [facility] Main Line: [phone] *Secure Chat Communication Welcome

## 2023-04-07 NOTE — Progress Notes (Signed)
PROGRESS NOTE    Harold [name]  ONG:[id number] DOB: 10-Sep-1955 DOA: 03/27/2023 PCP: [name], MD   Brief Narrative:  Harold [name] is a 68 y.o. male with PMH significant for morbid obesity, OSA, DM2, HTN, HLD, CAD/stent, A-fib on Eliquis, carotid artery stenosis, CKD, chronic anemia, anxiety/depression, diabetic neuropathy, GERD, hypothyroidism, COPD, pulmonary hypertension. 2/6, patient presented to the ED with complaint of cough, subjective fever, myalgia, generalized weakness for 5 days. Respiratory virus panel positive for influenza A. Chest x-ray negative for infiltrates. Out of for Tamiflu.  He improved from fluid with supportive care.   On admission, he was also noted to have right leg cellulitis. Per chart review, in November 2024, he underwent ORIF of the right ankle, with ensuing revision in December 2024.  He has subsequently been followed by wound care at home.  However over the course the last 1 week, patient had progressive right lateral malleolus erythema, swelling, tenderness, with some evidence of purulent drainage, X-ray showed soft tissue swelling most pronounced over the lateral malleolus, consistent with cellulitis, without evidence of subcutaneous gas or crepitus to suggest necrotizing fasciitis.  Orthopedics and vascular surgery consult appreciated. Procedures as below.  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 S/p  right ankle hardware removal, I&D and wound VAC placement -2/10 Dr. Jena Gauss In the setting of uncontrolled diabetes, and possible PAD.  S/p repeat debridement of the right ankle by Dr. Lajoyce Corners 04/02/2023.  Seen by ID [id number], antibiotics transitioned to cefepime and daptomycin.  Seen by PT OT, they recommended SNF, however patient will have to pay out-of-pocket for the co-pay which patient is unable to afford.  Several discussions between myself and TOC with the patient and his significant other, they have decided to take him home when medically ready.  Wound culture is growing polymicrobial organisms.  Due to prolonged QTc, ID unable to transition him to oral fluoroquinolones.  They have recommended continuing current cefepime and daptomycin for 4 weeks since surgery.  Recommended central line since PICC line cannot be placed due to CKD.  IR was consulted 04/04/2023 for central line placement.  Patient and his significant other will need teaching for home antibiotics.  TOC has been informed.  Still awaiting central line placement.   PAD Seen by vascular surgery.2/11, underwent aortogram, right leg angiogram, successful stenting of right superficial femoral artery.  Patient on Plavix and Eliquis which was resumed on 04/03/2023 after cleared by orthopedics.   Acute diarrhea Resolved   Influenza A: Presented with 5 days of cough, fever, myalgia. Respiratory virus panel positive for influenza A. Chest x-ray without acute infiltrate. No fever, no WBC count Was outside the 48-hour window for Tamiflu and hence was not initiated.  Doing well on supportive care.  Type 2 diabetes mellitus / Diabetic neuropathy, POA: A1c 7 on 01/02/2023. PTA meds-Tresiba 60 units daily, Mounjaro Currently on Lantus 30 units daily and SSI/Accu-Cheks.  Blood sugar slightly elevated.  Will increase Semglee to 35 units today.  PTA on gabapentin 1600 mg 3 times daily???.  Seems very high-dose.  Unable to confirm from the patient. Currently  on reduced dose of gabapentin at 400 mg 3 times daily.  AKI on CKD 4 Baseline creatinine 2.13 from December 2024.  Creatinine worsened to peak at 2.89 on 2/9, gradually improved and back to baseline now.  Avoid nephrotoxic agents.  Hypokalemia/hypomagnesemia: Low potassium and magnesium again, will replenish.  Paroxysmal A-fib Rates controlled on PTA meds Coreg, amiodarone and Eliquis.   Essential hypertension PTA meds- Coreg 3.125 mg twice daily, Lasix 160 mg daily, currently on home dose of Coreg but Lasix 40 mg p.o. twice daily, Lasix increased to 80 mg p.o. twice daily on 04/04/2023 and amlodipine 5 mg added, blood pressure still blood pressure elevated but he has not received morning meds yet.  Will monitor on current dosage.    CAD/stent Carotid artery stenosis HLD Continue PTA meds -Eliquis, Plavix, Crestor   Hypothyroidism Continue Synthroid   Morbid Obesity  Body mass index is 39.99 kg/m. Patient has been advised to make an attempt to improve diet and exercise patterns to aid in weight loss.   Obstructive sleep apnea Patient reported good compliance on home nocturnal CPAP   Gout Allopurinol, colchicine   Depression Cymbalta  AKI on CKD stage IIIb: Baseline creatinine appears to be 1.9-2.2.  Creatinine peaked to 2.8 this hospitalization and then improved to 2.2 but rising again and 2.6 today.  .  Patient on daptomycin.  Will defer to ID about daptomycin.  Will avoid any other nephrotoxic agents.  Monitor labs daily.  DVT prophylaxis: SCDs Start: 04/02/23 1617 SCDs Start: 03/31/23 1620 SCDs Start: 03/27/23 2243   Code Status: Full Code  Family Communication:  None present at bedside.  Plan of care discussed with patient in length and he/she verbalized understanding and agreed with it.  Will update his partner later today.  Status is: Inpatient Remains inpatient appropriate because: Patient needs central line which is pending(order placed to 04/04/2023 and IR informed).   Also needs teaching for him and his partner to do home IV antibiotics.   Estimated body mass index is 37.4 kg/m as calculated from the following:   Height as of this encounter: 6\' 1"  (1.854 m).   Weight as of this encounter: 128.6 kg.    Nutritional Assessment: Body mass index is 37.4 kg/m.Marland Kitchen Seen by dietician.  I agree with the assessment and plan as outlined below: Nutrition Status:        . Skin Assessment: I have examined the patient's skin and I agree with the wound assessment as performed by the wound care RN as outlined below:    Consultants:  Vascular surgery, orthopedics and ID  Procedures:  As above  Antimicrobials:  Anti-infectives (From admission, onward)    Start     Dose/Rate Route Frequency Ordered Stop   04/04/23 1400  DAPTOmycin (CUBICIN) IVPB 700 mg/127mL premix        700 mg 200 mL/hr over 30 Minutes Intravenous Daily 04/03/23 1435     04/02/23 1715  ceFAZolin (ANCEF) IVPB 2g/100 mL premix  Status:  Discontinued        2 g 200 mL/hr over 30 Minutes Intravenous Every 8 hours 04/02/23 1616 04/02/23 1623   04/02/23 0600  ceFAZolin (ANCEF) IVPB 3g/150 mL premix  Status:  Discontinued        3 g 300 mL/hr over 30 Minutes Intravenous On call to O.R.  04/01/23 1700 04/02/23 1549   03/31/23 1015  ceFAZolin (ANCEF) IVPB 2g/100 mL premix        2 g 200 mL/hr over 30 Minutes Intravenous On call to O.R. 03/31/23 0921 03/31/23 1203   03/29/23 2200  vancomycin (VANCOREADY) IVPB 2000 mg/400 mL  Status:  Discontinued       Placed in "Followed by" Linked Group   2,000 mg 200 mL/hr over 120 Minutes Intravenous Every 48 hours 03/27/23 2257 04/03/23 1435   03/28/23 1045  metroNIDAZOLE (FLAGYL) tablet 500 mg  Status:  Discontinued        500 mg Oral Every 12 hours 03/28/23 0955 04/03/23 0945   03/27/23 2330  vancomycin (VANCOCIN) 2,500 mg in sodium chloride 0.9 % 500 mL IVPB       Placed in "Followed by" Linked Group   2,500 mg 262.5 mL/hr over 120 Minutes  Intravenous  Once 03/27/23 2257 03/28/23 0241   03/27/23 2300  metroNIDAZOLE (FLAGYL) IVPB 500 mg  Status:  Discontinued        500 mg 100 mL/hr over 60 Minutes Intravenous Every 12 hours 03/27/23 2247 03/28/23 0955   03/27/23 2300  ceFEPIme (MAXIPIME) 2 g in sodium chloride 0.9 % 100 mL IVPB        2 g 200 mL/hr over 30 Minutes Intravenous Every 12 hours 03/27/23 2257     03/27/23 2100  ceFAZolin (ANCEF) IVPB 2g/100 mL premix        2 g 200 mL/hr over 30 Minutes Intravenous  Once 03/27/23 2058 03/27/23 2219         Subjective: Patient seen and examined.  He has no complaints.  He told me that his partner [name] is planning to come to the hospital today.  I did notice some blood in the canister from [facility].  Checking CBC today.  Objective: Vitals:   04/06/23 0610 04/06/23 1253 04/06/23 2023 04/07/23 0329  BP: (!) 152/70 136/71 (!) 149/65 (!) 158/73  Pulse:   68 63  Resp:  18 18 18   Temp:  99.7 F (37.6 C) 97.8 F (36.6 C) 97.8 F (36.6 C)  TempSrc:  Oral Oral Oral  SpO2:   98% 97%  Weight: 133.9 kg   128.6 kg  Height:        Intake/Output Summary (Last 24 hours) at 04/07/2023 0731 Last data filed at 04/07/2023 0630 Gross per 24 hour  Intake 1649 ml  Output 4225 ml  Net -2576 ml   Filed Weights   04/05/23 0445 04/06/23 0610 04/07/23 0329  Weight: 133.3 kg 133.9 kg 128.6 kg    Examination:  General exam: Appears calm and comfortable  Respiratory system: Clear to auscultation. Respiratory effort normal. Cardiovascular system: S1 & S2 heard, RRR. No JVD, murmurs, rubs, gallops or clicks.  +1 pitting edema bilateral lower extremity Gastrointestinal system: Abdomen is nondistended, soft and nontender. No organomegaly or masses felt. Normal bowel sounds heard. Central nervous system: Alert and oriented. No focal neurological deficits. Extremities: Dressing in wound VAC attached to the right lower extremity. Psychiatry: Judgement and insight appear normal. Mood & affect  appropriate.   Data Reviewed: I have personally reviewed following labs and imaging studies  CBC: Recent Labs  Lab 04/01/23 0529 04/02/23 0456 04/03/23 0455  WBC 8.4 7.1 8.4  NEUTROABS 6.0 5.0 5.6  HGB 11.5* 10.6* 10.6*  HCT 33.3* 31.5* 31.1*  MCV 85.6 85.6 85.7  PLT 211 210 230   Basic Metabolic Panel: Recent Labs  Lab 04/03/23 0455 04/04/23  1610 04/05/23 0427 04/06/23 0810 04/07/23 0411  NA 140 135 137 135 135  K 3.6 3.0* 3.3* 3.2* 3.7  CL 107 104 107 101 101  CO2 21* 21* 20* 21* 23  GLUCOSE 116* 150* 161* 158* 198*  BUN 18 19 22  24* 29*  CREATININE 2.24* 2.08* 2.47* 2.30* 2.59*  CALCIUM 9.2 8.7* 8.9 8.8* 9.1  MG  --   --   --  1.6* 2.0   GFR: Estimated Creatinine Clearance: 37.8 mL/min (A) (by C-G formula based on SCr of 2.59 mg/dL (H)). Liver Function Tests: No results for input(s): "AST", "ALT", "ALKPHOS", "BILITOT", "PROT", "ALBUMIN" in the last 168 hours.  No results for input(s): "LIPASE", "AMYLASE" in the last 168 hours. No results for input(s): "AMMONIA" in the last 168 hours. Coagulation Profile: No results for input(s): "INR", "PROTIME" in the last 168 hours.  Cardiac Enzymes: Recent Labs  Lab 04/04/23 0839  CKTOTAL 37*   BNP (last 3 results) No results for input(s): "PROBNP" in the last 8760 hours. HbA1C: No results for input(s): "HGBA1C" in the last 72 hours. CBG: Recent Labs  Lab 04/06/23 0610 04/06/23 1249 04/06/23 1841 04/07/23 0016 04/07/23 0610  GLUCAP 154* 160* 181* 201* 175*   Lipid Profile: No results for input(s): "CHOL", "HDL", "LDLCALC", "TRIG", "CHOLHDL", "LDLDIRECT" in the last 72 hours.  Thyroid Function Tests: No results for input(s): "TSH", "T4TOTAL", "FREET4", "T3FREE", "THYROIDAB" in the last 72 hours. Anemia Panel: No results for input(s): "VITAMINB12", "FOLATE", "FERRITIN", "TIBC", "IRON", "RETICCTPCT" in the last 72 hours. Sepsis Labs: No results for input(s): "PROCALCITON", "LATICACIDVEN" in the last 168  hours.   Recent Results (from the past 240 hours)  Respiratory (~20 pathogens) panel by PCR     Status: Abnormal   Collection Time: 03/28/23  3:15 PM   Specimen: Nasopharyngeal Swab; Respiratory  Result Value Ref Range Status   Adenovirus NOT DETECTED NOT DETECTED Final   Coronavirus 229E NOT DETECTED NOT DETECTED Final    Comment: (NOTE) The Coronavirus on the Respiratory Panel, DOES NOT test for the novel  Coronavirus (2019 nCoV)    Coronavirus HKU1 NOT DETECTED NOT DETECTED Final   Coronavirus NL63 NOT DETECTED NOT DETECTED Final   Coronavirus OC43 NOT DETECTED NOT DETECTED Final   Metapneumovirus NOT DETECTED NOT DETECTED Final   Rhinovirus / Enterovirus NOT DETECTED NOT DETECTED Final   Influenza A H3 DETECTED (A) NOT DETECTED Final   Influenza B NOT DETECTED NOT DETECTED Final   Parainfluenza Virus 1 NOT DETECTED NOT DETECTED Final   Parainfluenza Virus 2 NOT DETECTED NOT DETECTED Final   Parainfluenza Virus 3 NOT DETECTED NOT DETECTED Final   Parainfluenza Virus 4 NOT DETECTED NOT DETECTED Final   Respiratory Syncytial Virus NOT DETECTED NOT DETECTED Final   Bordetella pertussis NOT DETECTED NOT DETECTED Final   Bordetella Parapertussis NOT DETECTED NOT DETECTED Final   Chlamydophila pneumoniae NOT DETECTED NOT DETECTED Final   Mycoplasma pneumoniae NOT DETECTED NOT DETECTED Final    Comment: Performed at [facility], [address], [place], [place] [address]  Surgical pcr screen     Status: None   Collection Time: 03/31/23 10:15 AM   Specimen: Nasal Mucosa; Nasal Swab  Result Value Ref Range Status   MRSA, PCR NEGATIVE NEGATIVE Final   Staphylococcus aureus NEGATIVE NEGATIVE Final    Comment: (NOTE) The Xpert SA Assay (FDA approved for NASAL specimens in patients 39 years of age and older), is one component of a comprehensive surveillance program. It  is not intended to diagnose infection nor to guide or monitor treatment. Performed at [facility],  [address], [place], [place] [address]   Aerobic/Anaerobic Culture w Gram Stain (surgical/deep wound)     Status: None (Preliminary result)   Collection Time: 03/31/23 11:54 AM   Specimen: Path fluid; Tissue  Result Value Ref Range Status   Specimen Description WOUND RIGHT ANKLE  Final   Special Requests NONE  Final   Gram Stain   Final    RARE WBC PRESENT, PREDOMINANTLY PMN NO ORGANISMS SEEN    Culture   Final    RARE STAPHYLOCOCCUS EPIDERMIDIS RARE ENTEROBACTER CLOACAE NO ANAEROBES ISOLATED STAPHYLOCOCCUS EPIDERMIDIS Sent to [facility] for further susceptibility testing. Performed at [facility], [address], [place], [place] [address]    Report Status PENDING  Incomplete   Organism ID, Bacteria STAPHYLOCOCCUS EPIDERMIDIS  Final   Organism ID, Bacteria ENTEROBACTER CLOACAE  Final      Susceptibility   Enterobacter cloacae - MIC*    CEFEPIME <=0.12 SENSITIVE Sensitive     CEFTAZIDIME <=1 SENSITIVE Sensitive     CIPROFLOXACIN <=0.25 SENSITIVE Sensitive     GENTAMICIN <=1 SENSITIVE Sensitive     IMIPENEM 1 SENSITIVE Sensitive     TRIMETH/SULFA <=20 SENSITIVE Sensitive     PIP/TAZO <=4 SENSITIVE Sensitive ug/mL    * RARE ENTEROBACTER CLOACAE   Staphylococcus epidermidis - MIC*    CIPROFLOXACIN <=0.5 SENSITIVE Sensitive     ERYTHROMYCIN >=8 RESISTANT Resistant     GENTAMICIN <=0.5 SENSITIVE Sensitive     OXACILLIN >=4 RESISTANT Resistant     TETRACYCLINE >=16 RESISTANT Resistant     VANCOMYCIN 2 SENSITIVE Sensitive     TRIMETH/SULFA 160 RESISTANT Resistant     CLINDAMYCIN RESISTANT Resistant     RIFAMPIN <=0.5 SENSITIVE Sensitive     Inducible Clindamycin POSITIVE Resistant     LINEZOLID 2 SENSITIVE Sensitive     * RARE STAPHYLOCOCCUS EPIDERMIDIS  Aerobic/Anaerobic Culture w Gram Stain (surgical/deep wound)     Status: None (Preliminary result)   Collection Time: 04/02/23  2:27 PM   Specimen: Wound; Tissue  Result Value Ref Range Status   Specimen Description  WOUND RIGHT ANKLE  Final   Special Requests NONE  Final   Gram Stain NO WBC SEEN NO ORGANISMS SEEN   Final   Culture   Final    NO GROWTH 4 DAYS NO ANAEROBES ISOLATED; CULTURE IN PROGRESS FOR 5 DAYS Performed at [facility], [address], [place], [place] [address]    Report Status PENDING  Incomplete     Radiology Studies: No results found.   Scheduled Meds:  allopurinol  100 mg Oral Daily   amiodarone  200 mg Oral BID   amLODipine  5 mg Oral Daily   apixaban  5 mg Oral BID   carvedilol  3.125 mg Oral BID WC   clopidogrel  75 mg Oral Q breakfast   docusate sodium  100 mg Oral BID   DULoxetine  60 mg Oral Daily   ezetimibe  10 mg Oral Daily   furosemide  80 mg Oral BID   gabapentin  400 mg Oral TID   guaiFENesin  600 mg Oral BID   insulin aspart  0-9 Units Subcutaneous Q6H   insulin glargine-yfgn  35 Units Subcutaneous Daily   levothyroxine  88 mcg Oral QAC breakfast   rosuvastatin  20 mg Oral QPM   sodium chloride flush  3 mL  Intravenous Q12H   sodium chloride flush  3-10 mL Intravenous Q12H   Continuous Infusions:  sodium chloride     ceFEPime (MAXIPIME) IV Stopped (04/06/23 2203)   DAPTOmycin Stopped (04/06/23 1552)     LOS: 11 days   Hughie Closs, MD [facility]  04/07/2023, 7:31 AM   *Please note that this is a verbal dictation therefore any spelling or grammatical errors are due to the "Dragon Medical One" system interpretation.  Please page via Amion and do not message via secure chat for urgent patient care matters. Secure chat can be used for non urgent patient care matters.  How to contact the [facility] Attending or Consulting provider 7A - 7P or covering provider during after hours 7P -7A, for this patient?  Check the care team in [facility] and look for a) attending/consulting TR[facility] provider listed and b) the [facility] team listed. Page or secure chat 7A-7P. Log into [email or web address] and use [facility] universal password to access. If you do not have the  password, please contact the hospital operator. Locate the [facility] provider you are looking for under [facility] and page to a number that you can be directly reached. If you still have difficulty reaching the provider, please page the [facility] (Director on Call) for the Hospitalists listed on amion for assistance.

## 2023-04-08 ENCOUNTER — Encounter (HOSPITAL_COMMUNITY): Payer: Self-pay | Admitting: Orthopedic Surgery

## 2023-04-08 ENCOUNTER — Inpatient Hospital Stay (HOSPITAL_COMMUNITY): Payer: Medicare Other

## 2023-04-08 DIAGNOSIS — J101 Influenza due to other identified influenza virus with other respiratory manifestations: Secondary | ICD-10-CM | POA: Diagnosis not present

## 2023-04-08 DIAGNOSIS — I480 Paroxysmal atrial fibrillation: Secondary | ICD-10-CM

## 2023-04-08 DIAGNOSIS — R531 Weakness: Secondary | ICD-10-CM

## 2023-04-08 HISTORY — PX: IR RADIOLOGIST EVAL & MGMT: IMG5224

## 2023-04-08 LAB — GLUCOSE, CAPILLARY
Glucose-Capillary: 163 mg/dL — ABNORMAL HIGH (ref 70–99)
Glucose-Capillary: 252 mg/dL — ABNORMAL HIGH (ref 70–99)

## 2023-04-08 MED ORDER — CHLORHEXIDINE GLUCONATE 4 % EX SOLN
CUTANEOUS | Status: AC
  Filled 2023-04-08: qty 15

## 2023-04-08 MED ORDER — OXIDIZED CELLULOSE EX PADS
1.0000 | MEDICATED_PAD | Freq: Once | CUTANEOUS | Status: AC
  Administered 2023-04-08: 1 via TOPICAL
  Filled 2023-04-08: qty 1

## 2023-04-08 MED ORDER — INSULIN GLARGINE-YFGN 100 UNIT/ML ~~LOC~~ SOLN
40.0000 [IU] | Freq: Every day | SUBCUTANEOUS | Status: DC
  Administered 2023-04-08 – 2023-04-14 (×7): 40 [IU] via SUBCUTANEOUS
  Filled 2023-04-08 (×8): qty 0.4

## 2023-04-08 MED ORDER — DAPTOMYCIN IV (FOR PTA / DISCHARGE USE ONLY): 700.0000 mg | INTRAVENOUS | 0 refills | Status: DC

## 2023-04-08 MED ORDER — CHLORHEXIDINE GLUCONATE CLOTH 2 % EX PADS
6.0000 | MEDICATED_PAD | Freq: Every day | CUTANEOUS | Status: DC
  Administered 2023-04-08: 6 via TOPICAL

## 2023-04-08 MED ORDER — SODIUM CHLORIDE 0.9 % IV SOLN: INTRAVENOUS | Status: AC | PRN

## 2023-04-08 MED ORDER — LIDOCAINE HCL 1 % IJ SOLN
20.0000 mL | Freq: Once | INTRAMUSCULAR | Status: AC
  Administered 2023-04-08: 10 mL

## 2023-04-08 MED ORDER — LIDOCAINE HCL 1 % IJ SOLN
INTRAMUSCULAR | Status: AC
  Filled 2023-04-08: qty 20

## 2023-04-08 MED ORDER — CEFEPIME IV (FOR PTA / DISCHARGE USE ONLY): 2.0000 g | Freq: Two times a day (BID) | INTRAVENOUS | 0 refills | Status: DC

## 2023-04-08 NOTE — Progress Notes (Signed)
PM dose of eliquis refused by pt d/t fear that CVC site will begin to bleed again.

## 2023-04-08 NOTE — Progress Notes (Signed)
PHARMACY CONSULT NOTE FOR:  OUTPATIENT  PARENTERAL ANTIBIOTIC THERAPY (OPAT)  Indication: R-ankle osteo Regimen: Daptomycin 700 mg IV every 24 hours + Cefepime 2g IV every 12 hours End date: 05/12/23  IV every 24 hour discharge orders are pended. To discharging provider:  please sign these orders via discharge navigator,  Select New Orders & click on the button choice - Manage This Unsigned Work.     Thank you for allowing pharmacy to be a part of this patient's care.  Georgina Pillion, PharmD, BCPS, BCIDP Infectious Diseases Clinical Pharmacist 04/08/2023 11:37 AM   **Pharmacist phone directory can now be found on [email or web address] ([email or web address]).  Listed under [facility].

## 2023-04-08 NOTE — Progress Notes (Signed)
PROGRESS NOTE    Harold [name]  VWU:[id number] DOB: 05/29/1955 DOA: 03/27/2023 PCP: [name], MD   Brief Narrative:  Harold [name] is a 68 y.o. male with PMH significant for morbid obesity, OSA, DM2, HTN, HLD, CAD/stent, A-fib on Eliquis, carotid artery stenosis, CKD, chronic anemia, anxiety/depression, diabetic neuropathy, GERD, hypothyroidism, COPD, pulmonary hypertension. 2/6, patient presented to the ED with complaint of cough, subjective fever, myalgia, generalized weakness for 5 days. Respiratory virus panel positive for influenza A. Chest x-ray negative for infiltrates. Out of for Tamiflu.  He improved from fluid with supportive care.   On admission, he was also noted to have right leg cellulitis. Per chart review, in November 2024, he underwent ORIF of the right ankle, with ensuing revision in December 2024.  He has subsequently been followed by wound care at home.  However over the course the last 1 week, patient had progressive right lateral malleolus erythema, swelling, tenderness, with some evidence of purulent drainage, X-ray showed soft tissue swelling most pronounced over the lateral malleolus, consistent with cellulitis, without evidence of subcutaneous gas or crepitus to suggest necrotizing fasciitis.  Orthopedics and vascular surgery consult appreciated. Procedures as below.  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 S/p  right ankle hardware removal, I&D and wound VAC placement -2/10 Dr. Jena Gauss In the setting of uncontrolled diabetes, and possible PAD.  S/p repeat debridement of the right ankle by Dr. Lajoyce Corners 04/02/2023.  Seen by ID [id number], antibiotics transitioned to cefepime and daptomycin.  Seen by PT OT, they recommended SNF, however patient will have to pay out-of-pocket for the co-pay which patient is unable to afford.  Several discussions between myself and TOC with the patient and his significant other, they have decided to take him home when medically ready.  Wound culture is growing polymicrobial organisms.  Due to prolonged QTc, ID unable to transition him to oral fluoroquinolones.  They have recommended continuing current cefepime and daptomycin for 4 weeks since surgery EOT 05/12/2023.  They will get weekly CK as outpatient..  Recommended central line since PICC line cannot be placed due to CKD.  IR was consulted 04/04/2023 for central line placement.  Patient underwent central line placement on 04/07/2023 however he has been having issues with recurrent bleeding from the insertion site which has finally stopped this morning around 8 AM.  Would prefer to keep patient for observation for 24 hours to make sure bleeding has totally stopped.  Also, his partner Harold [name] came in yesterday, got some education about IV home therapy and needs some more education today.  Plan to discharge tomorrow.   PAD Seen by vascular surgery.2/11, underwent aortogram, right leg angiogram, successful stenting of right superficial femoral artery.  Patient on Plavix and Eliquis which was resumed on 04/03/2023 after cleared by orthopedics.   Acute diarrhea Resolved   Influenza A: Presented with 5 days of cough, fever, myalgia. Respiratory virus panel positive for influenza  A. Chest x-ray without acute infiltrate. No fever, no WBC count Was outside the 48-hour window for Tamiflu and hence was not initiated.  Doing well on supportive care.  Type  2 diabetes mellitus / Diabetic neuropathy, POA: A1c 7 on 01/02/2023. PTA meds-Tresiba 60 units daily, Mounjaro Currently on Lantus 35 units daily and SSI/Accu-Cheks.  Blood sugar slightly elevated again.  Will increase Semglee to 40 units today.  PTA on gabapentin 1600 mg 3 times daily???.  Seems very high-dose.  Unable to confirm from the patient. Currently on reduced dose of gabapentin at 400 mg 3 times daily.  Hypokalemia/hypomagnesemia: Resolved.  Paroxysmal A-fib Rates controlled on PTA meds Coreg, amiodarone and Eliquis.   Essential hypertension PTA meds- Coreg 3.125 mg twice daily, Lasix 160 mg daily, currently on home dose of Coreg but Lasix 40 mg p.o. twice daily, Lasix increased to 80 mg p.o. twice daily on 04/04/2023 and amlodipine 5 mg added, blood pressure still blood pressure elevated but he has not received morning meds yet.  Will monitor on current dosage.    Anemia of chronic disease: Baseline hemoglobin between 10.5 11.5.  Checking CBC today since he has had significant bleeding from the central line insertion site.  CAD/stent Carotid artery stenosis HLD Continue PTA meds -Eliquis, Plavix, Crestor   Hypothyroidism Continue Synthroid   Morbid Obesity  Body mass index is 39.99 kg/m. Patient has been advised to make an attempt to improve diet and exercise patterns to aid in weight loss.   Obstructive sleep apnea Patient reported good compliance on home nocturnal CPAP   Gout Allopurinol, colchicine   Depression Cymbalta  AKI on CKD stage IIIb: Baseline creatinine appears to be 1.9-2.2.  Creatinine peaked to 2.8 this hospitalization and then improved to 2.2 but rising again and 2.6 yesterday.   Patient on daptomycin.  Will defer to ID about daptomycin.  Will avoid any other nephrotoxic agents.  Labs are pending this morning.  DVT prophylaxis: SCDs Start: 04/02/23 1617 SCDs Start: 03/31/23 1620 SCDs Start: 03/27/23 2243   Code Status: Full Code  Family  Communication:  None present at bedside.  Plan of care discussed with patient in length and he/she verbalized understanding and agreed with it.  Will update his partner later today.  Status is: Inpatient Remains inpatient appropriate because: Bleeding from central line just stopped, peripheral monitor another 24 hours.   Estimated body mass index is 38.19 kg/m as calculated from the following:   Height as of this encounter: 6\' 1"  (1.854 m).   Weight as of this encounter: 131.3 kg.    Nutritional Assessment: Body mass index is 38.19 kg/m.Marland Kitchen Seen by dietician.  I agree with the assessment and plan as outlined below: Nutrition Status:        . Skin Assessment: I have examined the patient's skin and I agree with the wound assessment as performed by the wound care RN as outlined below:    Consultants:  Vascular surgery, orthopedics and ID  Procedures:  As above  Antimicrobials:  Anti-infectives (From admission, onward)    Start     Dose/Rate Route Frequency Ordered Stop   04/04/23 1400  DAPTOmycin (CUBICIN) IVPB 700 mg/180mL premix        700 mg 200 mL/hr over 30 Minutes Intravenous Daily 04/03/23 1435     04/02/23 1715  ceFAZolin (ANCEF) IVPB 2g/100 mL premix  Status:  Discontinued        2 g 200 mL/hr over 30 Minutes Intravenous Every 8 hours 04/02/23  1616 04/02/23 1623   04/02/23 0600  ceFAZolin (ANCEF) IVPB 3g/150 mL premix  Status:  Discontinued        3 g 300 mL/hr over 30 Minutes Intravenous On call to O.R. 04/01/23 1700 04/02/23 1549   03/31/23 1015  ceFAZolin (ANCEF) IVPB 2g/100 mL premix        2 g 200 mL/hr over 30 Minutes Intravenous On call to O.R. 03/31/23 0921 03/31/23 1203   03/29/23 2200  vancomycin (VANCOREADY) IVPB 2000 mg/400 mL  Status:  Discontinued       Placed in "Followed by" Linked Group   2,000 mg 200 mL/hr over 120 Minutes Intravenous Every 48 hours 03/27/23 2257 04/03/23 1435   03/28/23 1045  metroNIDAZOLE (FLAGYL) tablet 500 mg  Status:   Discontinued        500 mg Oral Every 12 hours 03/28/23 0955 04/03/23 0945   03/27/23 2330  vancomycin (VANCOCIN) 2,500 mg in sodium chloride 0.9 % 500 mL IVPB       Placed in "Followed by" Linked Group   2,500 mg 262.5 mL/hr over 120 Minutes Intravenous  Once 03/27/23 2257 03/28/23 0241   03/27/23 2300  metroNIDAZOLE (FLAGYL) IVPB 500 mg  Status:  Discontinued        500 mg 100 mL/hr over 60 Minutes Intravenous Every 12 hours 03/27/23 2247 03/28/23 0955   03/27/23 2300  ceFEPIme (MAXIPIME) 2 g in sodium chloride 0.9 % 100 mL IVPB        2 g 200 mL/hr over 30 Minutes Intravenous Every 12 hours 03/27/23 2257     03/27/23 2100  ceFAZolin (ANCEF) IVPB 2g/100 mL premix        2 g 200 mL/hr over 30 Minutes Intravenous  Once 03/27/23 2058 03/27/23 2219         Subjective: Patient seen and examined.  He has no complaint at all.  Objective: Vitals:   04/07/23 1933 04/08/23 0026 04/08/23 0546 04/08/23 0824  BP:  (!) 147/57 126/64 (!) 148/62  Pulse: 72  64 63  Resp: 17 18 18 19   Temp: 98.3 F (36.8 C) 98.8 F (37.1 C) 98 F (36.7 C) 97.9 F (36.6 C)  TempSrc: Axillary Oral Oral Oral  SpO2: 99% 97% 95% 95%  Weight:   131.3 kg   Height:        Intake/Output Summary (Last 24 hours) at 04/08/2023 0830 Last data filed at 04/08/2023 0100 Gross per 24 hour  Intake 1067.56 ml  Output 1850 ml  Net -782.44 ml   Filed Weights   04/06/23 0610 04/07/23 0329 04/08/23 0546  Weight: 133.9 kg 128.6 kg 131.3 kg    Examination:  General exam: Appears calm and comfortable, has a pressure dressing on the right anterior chest, no bleeding observed. Respiratory system: Clear to auscultation. Respiratory effort normal. Cardiovascular system: S1 & S2 heard, RRR. No JVD, murmurs, rubs, gallops or clicks.  +1 pitting edema bilateral lower extremity Gastrointestinal system: Abdomen is nondistended, soft and nontender. No organomegaly or masses felt. Normal bowel sounds heard. Central nervous  system: Alert and oriented. No focal neurological deficits. Extremities: Dressing in wound VAC attached to the right lower extremity.  Some bloody drainage from the wound VAC. Psychiatry: Judgement and insight appear normal. Mood & affect appropriate.   Data Reviewed: I have personally reviewed following labs and imaging studies  CBC: Recent Labs  Lab 04/02/23 0456 04/03/23 0455 04/07/23 0411  WBC 7.1 8.4 8.2  NEUTROABS 5.0 5.6 5.4  HGB 10.6* 10.6*  10.7*  HCT 31.5* 31.1* 31.5*  MCV 85.6 85.7 84.7  PLT 210 230 228   Basic Metabolic Panel: Recent Labs  Lab 04/03/23 0455 04/04/23 0839 04/05/23 0427 04/06/23 0810 04/07/23 0411  NA 140 135 137 135 135  K 3.6 3.0* 3.3* 3.2* 3.7  CL 107 104 107 101 101  CO2 21* 21* 20* 21* 23  GLUCOSE 116* 150* 161* 158* 198*  BUN 18 19 22  24* 29*  CREATININE 2.24* 2.08* 2.47* 2.30* 2.59*  CALCIUM 9.2 8.7* 8.9 8.8* 9.1  MG  --   --   --  1.6* 2.0   GFR: Estimated Creatinine Clearance: 38.3 mL/min (A) (by C-G formula based on SCr of 2.59 mg/dL (H)). Liver Function Tests: No results for input(s): "AST", "ALT", "ALKPHOS", "BILITOT", "PROT", "ALBUMIN" in the last 168 hours.  No results for input(s): "LIPASE", "AMYLASE" in the last 168 hours. No results for input(s): "AMMONIA" in the last 168 hours. Coagulation Profile: No results for input(s): "INR", "PROTIME" in the last 168 hours.  Cardiac Enzymes: Recent Labs  Lab 04/04/23 0839  CKTOTAL 37*   BNP (last 3 results) No results for input(s): "PROBNP" in the last 8760 hours. HbA1C: No results for input(s): "HGBA1C" in the last 72 hours. CBG: Recent Labs  Lab 04/07/23 0610 04/07/23 0756 04/07/23 1626 04/08/23 0031 04/08/23 0549  GLUCAP 175* 155* 179* 252* 163*   Lipid Profile: No results for input(s): "CHOL", "HDL", "LDLCALC", "TRIG", "CHOLHDL", "LDLDIRECT" in the last 72 hours.  Thyroid Function Tests: No results for input(s): "TSH", "T4TOTAL", "FREET4", "T3FREE", "THYROIDAB"  in the last 72 hours. Anemia Panel: No results for input(s): "VITAMINB12", "FOLATE", "FERRITIN", "TIBC", "IRON", "RETICCTPCT" in the last 72 hours. Sepsis Labs: No results for input(s): "PROCALCITON", "LATICACIDVEN" in the last 168 hours.   Recent Results (from the past 240 hours)  Surgical pcr screen     Status: None   Collection Time: 03/31/23 10:15 AM   Specimen: Nasal Mucosa; Nasal Swab  Result Value Ref Range Status   MRSA, PCR NEGATIVE NEGATIVE Final   Staphylococcus aureus NEGATIVE NEGATIVE Final    Comment: (NOTE) The Xpert SA Assay (FDA approved for NASAL specimens in patients 24 years of age and older), is one component of a comprehensive surveillance program. It is not intended to diagnose infection nor to guide or monitor treatment. Performed at [facility], [address], [place], [place] [address]   Aerobic/Anaerobic Culture w Gram Stain (surgical/deep wound)     Status: None (Preliminary result)   Collection Time: 03/31/23 11:54 AM   Specimen: Path fluid; Tissue  Result Value Ref Range Status   Specimen Description WOUND RIGHT ANKLE  Final   Special Requests NONE  Final   Gram Stain   Final    RARE WBC PRESENT, PREDOMINANTLY PMN NO ORGANISMS SEEN    Culture   Final    RARE STAPHYLOCOCCUS EPIDERMIDIS RARE ENTEROBACTER CLOACAE NO ANAEROBES ISOLATED STAPHYLOCOCCUS EPIDERMIDIS Sent to [facility] for further susceptibility testing. Performed at [facility], [address], [place], [place] [address]    Report Status PENDING  Incomplete   Organism ID, Bacteria STAPHYLOCOCCUS EPIDERMIDIS  Final   Organism ID, Bacteria ENTEROBACTER CLOACAE  Final      Susceptibility   Enterobacter cloacae - MIC*    CEFEPIME <=0.12 SENSITIVE Sensitive     CEFTAZIDIME <=1 SENSITIVE Sensitive     CIPROFLOXACIN <=0.25 SENSITIVE Sensitive     GENTAMICIN <=1 SENSITIVE Sensitive     IMIPENEM 1  SENSITIVE Sensitive     TRIMETH/SULFA <=20 SENSITIVE Sensitive     PIP/TAZO <=4  SENSITIVE Sensitive ug/mL    * RARE ENTEROBACTER CLOACAE   Staphylococcus epidermidis - MIC*    CIPROFLOXACIN <=0.5 SENSITIVE Sensitive     ERYTHROMYCIN >=8 RESISTANT Resistant     GENTAMICIN <=0.5 SENSITIVE Sensitive     OXACILLIN >=4 RESISTANT Resistant     TETRACYCLINE >=16 RESISTANT Resistant     VANCOMYCIN 2 SENSITIVE Sensitive     TRIMETH/SULFA 160 RESISTANT Resistant     CLINDAMYCIN RESISTANT Resistant     RIFAMPIN <=0.5 SENSITIVE Sensitive     Inducible Clindamycin POSITIVE Resistant     LINEZOLID 2 SENSITIVE Sensitive     * RARE STAPHYLOCOCCUS EPIDERMIDIS  Aerobic/Anaerobic Culture w Gram Stain (surgical/deep wound)     Status: None   Collection Time: 04/02/23  2:27 PM   Specimen: Wound; Tissue  Result Value Ref Range Status   Specimen Description WOUND RIGHT ANKLE  Final   Special Requests NONE  Final   Gram Stain NO WBC SEEN NO ORGANISMS SEEN   Final   Culture   Final    No growth aerobically or anaerobically. Performed at [facility], [address], [place], [place] [address]    Report Status 04/07/2023 FINAL  Final  MIC (1 Drug)-R-ankle wound; 03/31/2023; Wound; MRSE; Daptomycin     Status: None   Collection Time: 04/04/23  4:30 PM   Specimen: Wound  Result Value Ref Range Status   Min Inhibitory Conc (1 Drug) Preliminary report  Final    Comment: (NOTE) Performed At: [facility] [address] [place], [place] [id number] Jolene Schimke MD XB:[id number]    Source ANKLE  Final    Comment: Performed at [facility], [address] [place], [place] [address]     Radiology Studies: IR Fluoro Guide CV Line Right Result Date: 04/07/2023 INDICATION: 68 year old with osteomyelitis and needs a tunneled central line for antibiotics. EXAM: FLUOROSCOPIC AND ULTRASOUND GUIDED PLACEMENT OF A TUNNELED CENTRAL VENOUS CATHETER Physician: Rachelle Hora. Lowella Dandy, MD FLUOROSCOPY TIME:  Radiation Exposure Index (as provided by the fluoroscopic device): 4 mGy Kerma  MEDICATIONS: 1% lidocaine ANESTHESIA/SEDATION: None PROCEDURE: Informed consent was obtained for placement of a tunneled central venous catheter. The patient was placed supine on the interventional table. Ultrasound confirmed a patent right internal jugularvein. Ultrasound images were obtained for documentation. The right neck and chest was prepped and draped in a sterile fashion. The right neck was anesthetized with 1% lidocaine. Maximal barrier sterile technique was utilized including caps, mask, sterile gowns, sterile gloves, sterile drape, hand hygiene and skin antiseptic. A small incision was made with #11 blade scalpel. A 21 gauge needle directed into the right internal jugular vein with ultrasound guidance. A micropuncture dilator set was placed. A single lumen Powerline catheter was selected. The skin below the right clavicle was anesthetized and a small incision was made with an #11 blade scalpel. A subcutaneous tunnel was formed to the vein dermatotomy site. The catheter was brought through the tunnel. The vein dermatotomy site was dilated to accommodate a peel-away sheath. The catheter was placed through the peel-away sheath and directed into the central venous structures. The tip of the catheter was placed at the superior cavoatrial junction with fluoroscopy. Fluoroscopic images were obtained for documentation. Lumen aspirated and flushed well. Lumen was flushed with saline. The vein dermatotomy site was closed using Dermabond. The catheter was secured to the skin using Prolene suture.  FINDINGS: Catheter tip at the superior cavoatrial junction. Catheter length = 26 cm. COMPLICATIONS: None IMPRESSION: Successful placement of a right jugular tunneled central venous catheter using ultrasound and fluoroscopic guidance. Electronically Signed   By: Richarda Overlie M.D.   On: 04/07/2023 20:18   IR US Guide Vasc Access Right Result Date: 04/07/2023 INDICATION: 68 year old with osteomyelitis and needs a tunneled  central line for antibiotics. EXAM: FLUOROSCOPIC AND ULTRASOUND GUIDED PLACEMENT OF A TUNNELED CENTRAL VENOUS CATHETER Physician: Rachelle Hora. Lowella Dandy, MD FLUOROSCOPY TIME:  Radiation Exposure Index (as provided by the fluoroscopic device): 4 mGy Kerma MEDICATIONS: 1% lidocaine ANESTHESIA/SEDATION: None PROCEDURE: Informed consent was obtained for placement of a tunneled central venous catheter. The patient was placed supine on the interventional table. Ultrasound confirmed a patent right internal jugularvein. Ultrasound images were obtained for documentation. The right neck and chest was prepped and draped in a sterile fashion. The right neck was anesthetized with 1% lidocaine. Maximal barrier sterile technique was utilized including caps, mask, sterile gowns, sterile gloves, sterile drape, hand hygiene and skin antiseptic. A small incision was made with #11 blade scalpel. A 21 gauge needle directed into the right internal jugular vein with ultrasound guidance. A micropuncture dilator set was placed. A single lumen Powerline catheter was selected. The skin below the right clavicle was anesthetized and a small incision was made with an #11 blade scalpel. A subcutaneous tunnel was formed to the vein dermatotomy site. The catheter was brought through the tunnel. The vein dermatotomy site was dilated to accommodate a peel-away sheath. The catheter was placed through the peel-away sheath and directed into the central venous structures. The tip of the catheter was placed at the superior cavoatrial junction with fluoroscopy. Fluoroscopic images were obtained for documentation. Lumen aspirated and flushed well. Lumen was flushed with saline. The vein dermatotomy site was closed using Dermabond. The catheter was secured to the skin using Prolene suture. FINDINGS: Catheter tip at the superior cavoatrial junction. Catheter length = 26 cm. COMPLICATIONS: None IMPRESSION: Successful placement of a right jugular tunneled central venous  catheter using ultrasound and fluoroscopic guidance. Electronically Signed   By: Richarda Overlie M.D.   On: 04/07/2023 20:18   IR PATIENT EVAL TECH 0-60 MINS Result Date: 04/07/2023 Jasmine December, RT     04/07/2023  2:04 PM RN called IR because Patient's freshly placed tunneled central venous catheter was bleeding profusely from site. This IR tech went to the floor and held pressure on the site for approximately 10 minutes. Bleeding appears to have stopped. Quikclot was applied to catheter site, and gauze and tegaderm was placed. RN was notified to remove Quikclot after 24 hours.    Scheduled Meds:  allopurinol  100 mg Oral Daily   amiodarone  200 mg Oral BID   amLODipine  5 mg Oral Daily   apixaban  5 mg Oral BID   carvedilol  3.125 mg Oral BID WC   Chlorhexidine Gluconate Cloth  6 each Topical Q0600   clopidogrel  75 mg Oral Q breakfast   docusate sodium  100 mg Oral BID   DULoxetine  60 mg Oral Daily   ezetimibe  10 mg Oral Daily   furosemide  80 mg Oral BID   gabapentin  400 mg Oral TID   guaiFENesin  600 mg Oral BID   insulin aspart  0-9 Units Subcutaneous Q6H   insulin glargine-yfgn  40 Units Subcutaneous Daily   levothyroxine  88 mcg Oral QAC breakfast   rosuvastatin  20 mg Oral QPM   sodium chloride flush  3 mL Intravenous Q12H   sodium chloride flush  3-10 mL Intravenous Q12H   Continuous Infusions:  ceFEPime (MAXIPIME) IV Stopped (04/07/23 2127)   DAPTOmycin Stopped (04/07/23 1453)     LOS: 12 days   Hughie Closs, MD [facility]  04/08/2023, 8:30 AM   *Please note that this is a verbal dictation therefore any spelling or grammatical errors are due to the "Dragon [employer] One" system interpretation.  Please page via Amion and do not message via secure chat for urgent patient care matters. Secure chat can be used for non urgent patient care matters.  How to contact the [facility] Attending or Consulting provider 7A - 7P or covering provider during after hours 7P -7A,  for this patient?  Check the care team in [facility] and look for a) attending/consulting [facility] provider listed and b) the [facility] team listed. Page or secure chat 7A-7P. Log into [email or web address] and use [facility] universal password to access. If you do not have the password, please contact the hospital operator. Locate the [facility] provider you are looking for under [facility] and page to a number that you can be directly reached. If you still have difficulty reaching the provider, please page the [facility] (Director on Call) for the Hospitalists listed on amion for assistance.

## 2023-04-08 NOTE — Progress Notes (Addendum)
RN changed Wound vac cannister to the [employer]. No dressing change or exchanges   50cc of output total in large wound vac cannister

## 2023-04-08 NOTE — Plan of Care (Signed)
  Problem: Education: Goal: Knowledge of General Education information will improve Description: Including pain rating scale, medication(s)/side effects and non-pharmacologic comfort measures Outcome: Progressing   Problem: Health Behavior/Discharge Planning: Goal: Ability to manage health-related needs will improve Outcome: Progressing   Problem: Clinical Measurements: Goal: Ability to maintain clinical measurements within normal limits will improve Outcome: Progressing Goal: Will remain free from infection Outcome: Progressing Goal: Diagnostic test results will improve Outcome: Progressing Goal: Respiratory complications will improve Outcome: Progressing Goal: Cardiovascular complication will be avoided Outcome: Progressing   Problem: Activity: Goal: Risk for activity intolerance will decrease Outcome: Progressing   Problem: Nutrition: Goal: Adequate nutrition will be maintained Outcome: Progressing   Problem: Coping: Goal: Level of anxiety will decrease Outcome: Progressing   Problem: Elimination: Goal: Will not experience complications related to bowel motility Outcome: Progressing Goal: Will not experience complications related to urinary retention Outcome: Progressing   Problem: Pain Managment: Goal: General experience of comfort will improve and/or be controlled Outcome: Progressing   Problem: Safety: Goal: Ability to remain free from injury will improve Outcome: Progressing   Problem: Skin Integrity: Goal: Risk for impaired skin integrity will decrease Outcome: Progressing   Problem: Education: Goal: Ability to describe self-care measures that may prevent or decrease complications (Diabetes Survival Skills Education) will improve Outcome: Progressing Goal: Individualized Educational Video(s) Outcome: Progressing   Problem: Coping: Goal: Ability to adjust to condition or change in health will improve Outcome: Progressing   Problem: Fluid  Volume: Goal: Ability to maintain a balanced intake and output will improve Outcome: Progressing   Problem: Health Behavior/Discharge Planning: Goal: Ability to identify and utilize available resources and services will improve Outcome: Progressing Goal: Ability to manage health-related needs will improve Outcome: Progressing   Problem: Metabolic: Goal: Ability to maintain appropriate glucose levels will improve Outcome: Progressing   Problem: Nutritional: Goal: Maintenance of adequate nutrition will improve Outcome: Progressing Goal: Progress toward achieving an optimal weight will improve Outcome: Progressing   Problem: Skin Integrity: Goal: Risk for impaired skin integrity will decrease Outcome: Progressing   Problem: Tissue Perfusion: Goal: Adequacy of tissue perfusion will improve Outcome: Progressing   Problem: Education: Goal: Understanding of CV disease, CV risk reduction, and recovery process will improve Outcome: Progressing Goal: Individualized Educational Video(s) Outcome: Progressing   Problem: Activity: Goal: Ability to return to baseline activity level will improve Outcome: Progressing   Problem: Cardiovascular: Goal: Ability to achieve and maintain adequate cardiovascular perfusion will improve Outcome: Progressing Goal: Vascular access site(s) Level 0-1 will be maintained Outcome: Progressing   Problem: Health Behavior/Discharge Planning: Goal: Ability to safely manage health-related needs after discharge will improve Outcome: Progressing

## 2023-04-08 NOTE — Progress Notes (Signed)
Occupational Therapy Treatment Patient Details Name: Harold [name] MRN: [id number] DOB: 12/04/1955 Today's Date: 04/08/2023   History of present illness 68 y.o. male presents to [facility] 03/27/23 with productive cough and generalized weakness. Admitted with influenza A and cellulitis of R foot s/p hardware removal of the R ankle and application of wound vac on 2/10. Prior admit 11/24 for R ankle ORIF and in 12/24 for revision. S/p excisional debridement of right ankle on 2/12. Significant PMH: HTN, HLD, CAD s/p PCI, COPD, DM2, CKD stage IIIb, PVD, gout, OSA, neuropathy, COPD, a-fib, CAD s/p percutaneous coronary angioplasty, angina, obesity   OT comments  Pt participated in OT treatment session focusing on endurance, activity tolerance, sit to stand transitions, and ADL re-training. Pt able to stand at bedside to allow OT to change fitted sheet with SBA and use of RW. Continues to have difficulty powering up into standing from seated. Patient will benefit from continued inpatient follow up therapy, <3 hours/day. OT will continue to follow patient acutely.        If plan is discharge home, recommend the following:  A little help with walking and/or transfers;A lot of help with bathing/dressing/bathroom;Assistance with cooking/housework;Assist for transportation;Help with stairs or ramp for entrance   Equipment Recommendations  Other (comment) (bariatric BSC)       Precautions / Restrictions Precautions Precautions: Fall Recall of Precautions/Restrictions: Impaired Precaution/Restrictions Comments: Watch BP Required Braces or Orthoses: Other Brace Other Brace: R CAM boot for ambulation, R splint in bed Restrictions Weight Bearing Restrictions Per Provider Order: Yes RLE Weight Bearing Per Provider Order: Weight bearing as tolerated Other Position/Activity Restrictions: per Dr. Audrie Lia note 04/03/2023       Mobility Bed Mobility Overal bed mobility: Needs Assistance Bed Mobility: Supine  to Sit, Sit to Supine     Supine to sit: Supervision, HOB elevated, Used rails Sit to supine: Supervision   General bed mobility comments: Increased time to complete task. VC provide to patient to assist with body alignment once in bed.    Transfers Overall transfer level: Needs assistance Equipment used: Rolling walker (2 wheels) Transfers: Sit to/from Stand Sit to Stand: From elevated surface, Min assist           General transfer comment: VC for hand placement during RW management prior to sit<>stand transition. Pt was able to take lateral side steps towards left. Followed WBAT for RLE per Dr. Audrie Lia note on 2/13.     Balance Overall balance assessment: Needs assistance Sitting-balance support: No upper extremity supported, Feet supported Sitting balance-Leahy Scale: Good Sitting balance - Comments: Pt sat EOB with supervision.   Standing balance support: Bilateral upper extremity supported, Reliant on assistive device for balance Standing balance-Leahy Scale: Poor            ADL either performed or assessed with clinical judgement   ADL       Grooming: Oral care;Set up;Sitting                       Communication Communication Communication: No apparent difficulties   Cognition Arousal: Alert Behavior During Therapy: WFL for tasks assessed/performed Cognition: No apparent impairments        Following commands: Intact        Cueing   Cueing Techniques: Verbal cues  Exercises Other Exercises Other Exercises: A/ROM, LUE, chest press, overhead press, 10X, 1 set, seated. Other Exercises: A/ROM, BLE, knee flexion/extension, 10X, 1 set, seated.       General Comments  Pt mentioned some dizziness while seated EOB. BP taken 121/66 (83) P: 75.    Pertinent Vitals/ Pain       Pain Assessment Pain Assessment: No/denies pain      Frequency  Min 1X/week        Progress Toward Goals  OT Goals(current goals can now be found in the care plan  section)  Progress towards OT goals: Progressing toward goals            AM-PAC OT "6 Clicks" Daily Activity     Outcome Measure   Help from another person eating meals?: None Help from another person taking care of personal grooming?: None Help from another person toileting, which includes using toliet, bedpan, or urinal?: A Lot Help from another person bathing (including washing, rinsing, drying)?: A Lot Help from another person to put on and taking off regular upper body clothing?: A Little Help from another person to put on and taking off regular lower body clothing?: Total 6 Click Score: 16    End of Session Equipment Utilized During Treatment: Gait belt;Rolling walker (2 wheels)  OT Visit Diagnosis: Unsteadiness on feet (R26.81);Other abnormalities of gait and mobility (R26.89);Muscle weakness (generalized) (M62.81);Pain Pain - Right/Left: Right Pain - part of body: Ankle and joints of foot   Activity Tolerance Patient tolerated treatment well   Patient Left in bed;with call bell/phone within reach;with bed alarm set   Nurse Communication Mobility status        Time: 1610-9604 OT Time Calculation (min): 26 min  Charges: OT General Charges $OT Visit: 1 Visit OT Treatments $Self Care/Home Management : 8-22 mins $Therapeutic Exercise: 8-22 mins  Limmie Patricia, OTR/L,CBIS  Supplemental OT - [facility] and [facility] Secure Chat Preferred    Kiandria Clum, Charisse March 04/08/2023, 4:53 PM

## 2023-04-08 NOTE — Plan of Care (Signed)
  Problem: Education: Goal: Knowledge of General Education information will improve Description: Including pain rating scale, medication(s)/side effects and non-pharmacologic comfort measures Outcome: Progressing   Problem: Nutrition: Goal: Adequate nutrition will be maintained Outcome: Progressing   Problem: Coping: Goal: Level of anxiety will decrease Outcome: Progressing   Problem: Elimination: Goal: Will not experience complications related to urinary retention Outcome: Progressing   Problem: Pain Managment: Goal: General experience of comfort will improve and/or be controlled Outcome: Progressing   Problem: Safety: Goal: Ability to remain free from injury will improve Outcome: Progressing

## 2023-04-08 NOTE — Progress Notes (Addendum)
Pt back from IR around 0945 dressing had minimal drainage on it.  About an hour later, RN noticed more blood on dressing. RN called IR and they reccommended to change dressing and call back for an update  1145: Rn CeCe held pressure on site once dressing was removed for about 10 minutes  and steri-strips applied. New dressing and bio patch applied

## 2023-04-08 NOTE — Consult Note (Signed)
[facility] Liaison Consult Note    04/08/2023  Harold [name] [birth year] [id number]  Insurance: [employer]   Primary Care Provider: [name], MD with [facility], this provider is listed for the transition of care follow up appointments  and [facility] calls   [facility] Liaison met patient at bedside at [facility]. Patient confirms PCP and accepting of post hospital TOC calls.     The patient was screened for 11 day LLOS hospitalization with noted extreme high risk score for unplanned readmission risk 3 hospital admissions in 6 months.  The patient was assessed for potential Community Care Coordination service needs for post hospital transition for care coordination. Review of patient's electronic medical record reveals patient is for home with IV therapy.   Plan: [facility] Liaison will continue to follow progress and disposition to asess for post hospital community care coordination/management needs.  Referral request for community care coordination: Anticipate [facility] TOC team to follow up, patient expressed no other needs at this time.  Feels comfortable with IV at home.   [facility] Community Care, Population Health does not replace or interfere with any arrangements made by the Inpatient Transition of Care team.   For questions contact:   Charlesetta Shanks, RN, BSN, CCM [place]  [facility], [facility] Liaison Direct Dial: [phone] or secure chat Email: [email or web address]

## 2023-04-08 NOTE — Progress Notes (Addendum)
Central line site continuing to ooze blood causing gauze, biopatch & underarm of pt's gown to be saturated w/ blood. Dressing removed & manual pressure held at & above site for 20 mins. New biopatch & pressure dressing applied.   Update: 2/18 1:30 Central line site found oozing again leaving under arm of gown & sheet beneath it saturated. Toniann Fail, MD paged & order received for surgicel. Surgicel placed & covered w/ gauze.

## 2023-04-09 ENCOUNTER — Encounter (HOSPITAL_COMMUNITY): Payer: Self-pay

## 2023-04-09 DIAGNOSIS — L97314 Non-pressure chronic ulcer of right ankle with necrosis of bone: Secondary | ICD-10-CM | POA: Diagnosis not present

## 2023-04-09 DIAGNOSIS — L02415 Cutaneous abscess of right lower limb: Secondary | ICD-10-CM | POA: Diagnosis not present

## 2023-04-09 DIAGNOSIS — J101 Influenza due to other identified influenza virus with other respiratory manifestations: Secondary | ICD-10-CM | POA: Diagnosis not present

## 2023-04-09 LAB — GLUCOSE, CAPILLARY
Glucose-Capillary: 148 mg/dL — ABNORMAL HIGH (ref 70–99)
Glucose-Capillary: 200 mg/dL — ABNORMAL HIGH (ref 70–99)
Glucose-Capillary: 203 mg/dL — ABNORMAL HIGH (ref 70–99)
Glucose-Capillary: 262 mg/dL — ABNORMAL HIGH (ref 70–99)
Glucose-Capillary: 267 mg/dL — ABNORMAL HIGH (ref 70–99)

## 2023-04-09 LAB — CBC
HCT: 28.2 % — ABNORMAL LOW (ref 39.0–52.0)
Hemoglobin: 9.7 g/dL — ABNORMAL LOW (ref 13.0–17.0)
MCH: 29.7 pg (ref 26.0–34.0)
MCHC: 34.4 g/dL (ref 30.0–36.0)
MCV: 86.2 fL (ref 80.0–100.0)
Platelets: 209 10*3/uL (ref 150–400)
RBC: 3.27 MIL/uL — ABNORMAL LOW (ref 4.22–5.81)
RDW: 14.3 % (ref 11.5–15.5)
WBC: 7.8 10*3/uL (ref 4.0–10.5)
nRBC: 0 % (ref 0.0–0.2)

## 2023-04-09 LAB — MINIMUM INHIBITORY CONC. (1 DRUG)

## 2023-04-09 LAB — BASIC METABOLIC PANEL
Anion gap: 10 (ref 5–15)
BUN: 39 mg/dL — ABNORMAL HIGH (ref 8–23)
CO2: 25 mmol/L (ref 22–32)
Calcium: 8.6 mg/dL — ABNORMAL LOW (ref 8.9–10.3)
Chloride: 98 mmol/L (ref 98–111)
Creatinine, Ser: 3.16 mg/dL — ABNORMAL HIGH (ref 0.61–1.24)
GFR, Estimated: 20 mL/min — ABNORMAL LOW (ref >60)
Glucose, Bld: 255 mg/dL — ABNORMAL HIGH (ref 70–99)
Potassium: 3.2 mmol/L — ABNORMAL LOW (ref 3.5–5.1)
Sodium: 133 mmol/L — ABNORMAL LOW (ref 135–145)

## 2023-04-09 LAB — MIC RESULT

## 2023-04-09 MED ORDER — POTASSIUM CHLORIDE CRYS ER 20 MEQ PO TBCR
60.0000 meq | EXTENDED_RELEASE_TABLET | Freq: Once | ORAL | Status: AC
  Administered 2023-04-09: 60 meq via ORAL
  Filled 2023-04-09: qty 3

## 2023-04-09 MED ORDER — CHLORHEXIDINE GLUCONATE CLOTH 2 % EX PADS
6.0000 | MEDICATED_PAD | Freq: Every day | CUTANEOUS | Status: DC
  Administered 2023-04-09 – 2023-05-05 (×27): 6 via TOPICAL

## 2023-04-09 MED ORDER — ALBUMIN HUMAN 25 % IV SOLN
25.0000 g | Freq: Once | INTRAVENOUS | Status: AC
  Administered 2023-04-09: 25 g via INTRAVENOUS
  Filled 2023-04-09: qty 100

## 2023-04-09 NOTE — Progress Notes (Signed)
Physical Therapy Treatment Patient Details Name: Harold [name] MRN: [id number] DOB: [birth year] Today's Date: 04/09/2023   History of Present Illness 68 y.o. male presents to [facility] 03/27/23 with productive cough and generalized weakness. Admitted with influenza A and cellulitis of R foot s/p hardware removal of the R ankle and application of wound vac on 2/10. Prior admit 11/24 for R ankle ORIF and in 12/24 for revision. S/p excisional debridement of right ankle on 2/12. Pt underwent central line placement on 04/07/2023 however he has been having issues with recurrent bleeding from the insertion site which has finally stopped. Significant PMH: HTN, HLD, CAD s/p PCI, COPD, DM2, CKD stage IIIb, PVD, gout, OSA, neuropathy, COPD, a-fib, CAD s/p percutaneous coronary angioplasty, angina, obesity    PT Comments  Pt pleasant and agreeable to PT session. He performed supine>sit with supervision and sit>supine with minA to bring BLE back onto bed. Pt was initially asymptomatic while sitting EOB, but immediately reported lightheadedness upon standing and had to quickly return to sitting. Pt re-attempted standing with minA, but was unable to advance functional mobility this session d/t symptomatic hypotension. Encouraged pt to maintain HOB elevated to increase his upright tolerance. Pt completed BLE exercises supine in bed to strengthen his RLE. Patient will benefit from continued inpatient follow up therapy, <3 hours/day.   Vitals Supine: BP 152/67 (88) Seated EOB: BP 97/71 (81), pt denied dizziness Seated following BLE exercises: BP 97/74 (79), pt denied dizziness Seated following stand attempt: BP 100/85 (91), pt immediately c/o lightheadedness upon obtaining upright Supine: BP 135/70 (95)    If plan is discharge home, recommend the following: A lot of help with bathing/dressing/bathroom;Assistance with cooking/housework;Assist for transportation;Help with stairs or ramp for entrance;Two people to help  with walking and/or transfers   Can travel by private vehicle     No  Equipment Recommendations  BSC/3in1;Wheelchair (measurements PT);Wheelchair cushion (measurements PT);Hoyer lift    Recommendations for Other Services       Precautions / Restrictions Precautions Precautions: Fall Recall of Precautions/Restrictions: Impaired Precaution/Restrictions Comments: Watch BP Required Braces or Orthoses: Other Brace Other Brace: R CAM boot for ambulation, R splint in bed Restrictions Weight Bearing Restrictions Per Provider Order: Yes RLE Weight Bearing Per Provider Order: Touchdown weight bearing     Mobility  Bed Mobility Overal bed mobility: Needs Assistance Bed Mobility: Supine to Sit, Sit to Supine     Supine to sit: HOB elevated, Used rails, Supervision Sit to supine: Min assist   General bed mobility comments: Pt sat up on the L side of bed, brought BLE off EOB, HOB elevated ~20deg, and use of bedrail to pull himself into sitting. Pt scooted fwd with increased time til feet flat on floor. Returning to supine secondary to pt's c/o back pain and dizziness, minA to bring BLE back into bed. Repositioned using bed features and bed pad +2 assist.    Transfers Overall transfer level: Needs assistance Equipment used: Rolling walker (2 wheels) Transfers: Sit to/from Stand Sit to Stand: From elevated surface, Min assist           General transfer comment: Re-educated pt on WBing precautions. STS from a slightly elevated surface d/t pt's height and need to get feet underneath him. STS x2 reps. Poor eccentric control with sitting secondary to onset of lightheadedness.    Ambulation/Gait               General Gait Details: Deferred d/t symptomatic hypotension   Stairs  Wheelchair Mobility     Tilt Bed    Modified Rankin (Stroke Patients Only)       Balance Overall balance assessment: Needs assistance Sitting-balance support: No upper  extremity supported, Feet supported Sitting balance-Leahy Scale: Fair Sitting balance - Comments: Pt sat EOB with supervision for a prolonged period of time, which ended up flaring his back pain.   Standing balance support: Bilateral upper extremity supported, Reliant on assistive device for balance Standing balance-Leahy Scale: Poor Standing balance comment: Pt maintains FWB on LLE and TDWB on RLE, so he is slightly shifted to the L  and unsteady in standing d/t anxiety and onset of dizziness. Pt relies on RW for stability.                            Communication Communication Communication: No apparent difficulties  Cognition Arousal: Alert Behavior During Therapy: WFL for tasks assessed/performed   PT - Cognitive impairments: No apparent impairments                         Following commands: Intact      Cueing Cueing Techniques: Verbal cues  Exercises General Exercises - Lower Extremity Ankle Circles/Pumps: Seated, Both, 10 reps, AROM Quad Sets: Supine, Left, 10 reps, Strengthening Gluteal Sets: Supine, Both, 10 reps, Strengthening Long Arc Quad: Seated, Both, 10 reps, AROM Hip ABduction/ADduction: Supine, Left, 10 reps, Strengthening Straight Leg Raises: Sidelying, Right, 10 reps, Strengthening Hip Flexion/Marching: Seated, Both, 10 reps, AROM    General Comments General comments (skin integrity, edema, etc.): Start of Session: BP 152/67 (88), Seated EOB: BP 97/71 (81), pt denied dizziness, Seated following BLE exercises: BP 97/74 (79), pt denied dizziness, Standing: BP 100/85 (91).      Pertinent Vitals/Pain Pain Assessment Pain Assessment: 0-10 Pain Score: 5  Pain Location: Back Pain Descriptors / Indicators: Sore, Discomfort, Aching Pain Intervention(s): Monitored during session, Limited activity within patient's tolerance    Home Living                          Prior Function            PT Goals (current goals can now be  found in the care plan section) Acute Rehab PT Goals Patient Stated Goal: Get rid of this dizziness and be able to move better Progress towards PT goals: Not progressing toward goals - comment (Pt ability to advance functional mobility is limited by symptomatic hypotension.)    Frequency    Min 1X/week      PT Plan      Co-evaluation              AM-PAC PT "6 Clicks" Mobility   Outcome Measure  Help needed turning from your back to your side while in a flat bed without using bedrails?: A Lot Help needed moving from lying on your back to sitting on the side of a flat bed without using bedrails?: A Lot Help needed moving to and from a bed to a chair (including a wheelchair)?: Total Help needed standing up from a chair using your arms (e.g., wheelchair or bedside chair)?: Total Help needed to walk in hospital room?: Total Help needed climbing 3-5 steps with a railing? : Total 6 Click Score: 8    End of Session Equipment Utilized During Treatment: Gait belt Activity Tolerance: Patient limited by pain;Other (comment) (Treatment limited secondary to  symptomatic hypotension) Patient left: in bed;with call bell/phone within reach;with nursing/sitter in room Nurse Communication: Mobility status;Other (comment) (VS during activity) PT Visit Diagnosis: Other abnormalities of gait and mobility (R26.89);Unsteadiness on feet (R26.81);Muscle weakness (generalized) (M62.81);History of falling (Z91.81);Difficulty in walking, not elsewhere classified (R26.2)     Time: 9604-5409 PT Time Calculation (min) (ACUTE ONLY): 29 min  Charges:    $Therapeutic Exercise: 8-22 mins $Therapeutic Activity: 8-22 mins PT General Charges $$ ACUTE PT VISIT: 1 Visit                     Cheri Guppy, PT, DPT Acute Rehabilitation Services Office: [phone] Secure Chat Preferred   Richardson Chiquito 04/09/2023, 12:35 PM

## 2023-04-09 NOTE — Progress Notes (Signed)
There was a consult for bleeding at CVC site. Assessed CVC site bleeding, but no bleeding since patient's RN applied steri strips on it. There was very tiny blood inside of dressing. Informed patient's RN that reassess bleeding after 24 hours from the time of applying dressing. HS [employer]

## 2023-04-09 NOTE — Progress Notes (Signed)
Progress Note   Patient: Harold [name] WRU:[id number] DOB: 1955/03/12 DOA: 03/27/2023     13 DOS: the patient was seen and examined on 04/09/2023   Brief hospital course: 68 y.o. male with PMH significant for morbid obesity, OSA, DM2, HTN, HLD, CAD/stent, A-fib on Eliquis, carotid artery stenosis, CKD, chronic anemia, anxiety/depression, diabetic neuropathy, GERD, hypothyroidism, COPD, pulmonary hypertension. 2/6, patient presented to the ED with complaint of cough, subjective fever, myalgia, generalized weakness for 5 days. Respiratory virus panel positive for influenza A. Chest x-ray negative for infiltrates. Out of for Tamiflu.  He improved from fluid with supportive care.   On admission, he was also noted to have right leg cellulitis. Per chart review, in November 2024, he underwent ORIF of the right ankle, with ensuing revision in December 2024.  He has subsequently been followed by wound care at home.  However over the course the last 1 week, patient had progressive right lateral malleolus erythema, swelling, tenderness, with some evidence of purulent drainage, X-ray showed soft tissue swelling most pronounced over the lateral malleolus, consistent with cellulitis, without evidence of subcutaneous gas or crepitus to suggest necrotizing fasciitis.  Orthopedics and vascular surgery consult appreciated.  Assessment and Plan: Right leg wound infection -Prior right ankle surgery (12/2022) complicated by postsurgical wound infection. S/p right ankle hardware removal, I&D and wound VAC placement -2/10 Dr. Jena Gauss In the setting of uncontrolled diabetes, and possible PAD.  S/p repeat debridement of the right ankle by Dr. Lajoyce Corners 04/02/2023.  Seen by ID [id number], antibiotics transitioned to cefepime and daptomycin.   Wound culture grew polymicrobial organisms.  ID consulted and recommended continuing current cefepime and daptomycin for 4 weeks since surgery EOT 05/12/2023 with weekly CK as outpatient..  Central line placed on 04/07/2023 with some post-procedural bleed noted.   Seen by PT OT, they recommended SNF, however patient will have to pay out-of-pocket for the co-pay which patient is unable to afford, ultimate decision to d/c home at time of d/c   PAD Seen by vascular surgery.2/11, underwent aortogram, right leg angiogram, successful stenting of right superficial femoral artery.  Patient on Plavix and Eliquis which was resumed on 04/03/2023 after cleared by orthopedics.   Acute diarrhea Resolved   Influenza A: Presented with 5 days of cough, fever, myalgia. Respiratory virus panel positive for influenza A. Chest x-ray without acute infiltrate. No fever, no WBC count Was outside the 48-hour window for Tamiflu and hence was not initiated.  Doing well on supportive care.   Type 2 diabetes mellitus / Diabetic neuropathy, POA: A1c 7 on 01/02/2023. PTA meds-Tresiba 60 units daily, Mounjaro Currently on Lantus 35 units daily and SSI/Accu-Cheks.  Blood sugar slightly elevated again.  Will increase Semglee to 40 units today.  PTA reportedly on gabapentin 1600 mg 3 times daily. Since dose was unable to be confirmed, pt was continued on 400 mg 3 times daily instead   Hypokalemia/hypomagnesemia: Resolved.   Paroxysmal A-fib Rates controlled on PTA meds Coreg, amiodarone and Eliquis.   Essential hypertension PTA meds- Coreg 3.125 mg twice daily, Lasix 160 mg daily, currently on home dose of Coreg with amlodipine 5 mg added -Lasix was increased from 40mg  bid to 80mg  bid on 2/14, however with  resultant increase of Cr up to 3.16. Lasix subsequently held   Anemia of chronic disease: Baseline hemoglobin between 10.5 11.5.  Checking CBC today since he has had significant bleeding from the central line insertion site.   CAD/stent Carotid artery stenosis HLD Continue PTA  meds -Eliquis, Plavix, Crestor   Hypothyroidism Continue Synthroid   Morbid Obesity  Body mass index is 39.99 kg/m. Patient  has been advised to make an attempt to improve diet and exercise patterns to aid in weight loss.   Obstructive sleep apnea Patient reported good compliance on home nocturnal CPAP   Gout Allopurinol, colchicine   Depression Cymbalta   AKI on CKD stage IIIb: Baseline creatinine appears to be 1.9-2.2.  Creatinine peaked to 3.14 that started to trend up after increasing lasix dose to 80mg  -Lasix now held -Allow pt to self-hydrate given propensity for developing edema in the setting of CKD and heart disease -recheck bmet in AM   Subjective: Without complaints  Physical Exam: Vitals:   04/09/23 1011 04/09/23 1013 04/09/23 1019 04/09/23 1441  BP: 97/74 100/85 135/70 (!) 120/51  Pulse:    61  Resp:    18  Temp:    98.1 F (36.7 C)  TempSrc:    Axillary  SpO2:    96%  Weight:      Height:       General exam: Awake, laying in bed, in nad Respiratory system: Normal respiratory effort, no wheezing Cardiovascular system: regular rate, s1, s2 Gastrointestinal system: Soft, nondistended, positive BS Central nervous system: CN2-12 grossly intact, strength intact Extremities: Perfused, no clubbing Skin: Normal skin turgor, no notable skin lesions seen Psychiatry: Mood normal // no visual hallucinations   Data Reviewed:  Labs reviewed: Na 133, K 3.2, Cr 3.16, WBC 7.8, Hgb 9.7, Plts 209  Family Communication: Pt in room, family not at bedside  Disposition: Status is: Inpatient Remains inpatient appropriate because: Severity of illness  Planned Discharge Destination: Home     Author: Rickey Barbara, MD 04/09/2023 5:32 PM  For on call review [email or web address].

## 2023-04-09 NOTE — Hospital Course (Addendum)
 HPI:  Harold [name] is a 68 y.o. male with medical history significant for paroxysmal atrial fibrillation chronically anticoagulated on Eliquis, type 2 diabetes mellitus, essential hypertension, stage IV CKD with baseline creatinine 2.1-2.6, who is admitted to [facility] on 03/27/2023 with influenza A after presenting from home to [facility] ED complaining of cough.    The patient reports 5 days of new onset productive cough associate with subjective fever and generalized myalgias.  Denies any associated shortness of breath, orthopnea, or PND.  No associated any wheezing, chest pain, palpitations, diaphoresis, dizziness, presyncope, or syncope.  Over the last 5 days, he also notes onset generalized weakness in the absence of any acute focal weakness.  Denies any recent dysuria or gross hematuria.   In November 2024, he underwent ORIF of the right ankle, with ensuing revision in December 2024.  He has subsequently been followed by wound care at home.  However, over the course of the last week, he has noted increase in size of the wound over the lateral aspect of the right ankle, associate with some purulent drainage and increase in surrounding erythema, tenderness, swelling.  He conveys that home health wound care also noted worsening of this right lateral malleolus wound and surrounding erythema.  He has a history of diabetic peripheral polyneuropathy, but within this context, denies any acute focal numbness or paresthesias.  Denies any acute focal weakness.    ED Course:  Vital signs in the ED were notable for the following: Afebrile; heart's in the 60s to 70s; systolic pressures in the 150s to 180s; respiratory rate 18-21, oxygen saturation 99-1 high percent on room air.   Labs were notable for the following: CMP notable for the following: Creatinine 2.73, glucose 241, calcium adjusted for mild hypoalbuminemia noted to be 10.1, albumin 2.3.  Otherwise, liver enzymes are within normal limits.  CBC  notable for white cell count 7400, hemoglobin 14.1.  Influenza A a PCR is positive, will COVID, influenza B and RSV PCR were all negative.   Per my interpretation, EKG in ED demonstrated the following: No EKG performed in the ED today.   Imaging in the ED, per corresponding formal radiology read, was notable for the following: 2 view chest x-ray showed no evidence of acute cardiopulmonary process, including no evidence of infiltrate, Naima, effusion, or pneumothorax.  They films of the right tib-fib showed soft tissue swelling most pronounced over the lateral malleolus, all demonstrating no evidence of subcutaneous gas and no evidence of acute bony abnormality.   EDP d/w on-call ortho from [facility] (Dr. Carola Frost), who conveyed that the [facility] will formally consult and will see patient in the morning. Dr. Carola Frost requests that patient be made NPO at [facility] in case it is determined that a procedure is necessary.   While in the ED, the following were administered: Tessalon Perles 200 mg p.o. x 1 dose, Ancef 2 g IV x 1 dose.   Subsequently, the patient was admitted for further evaluation management of presenting influenza A infection as well as cellulitis of the right foot in the setting of concern for infected right diabetic foot wound.  Significant Events: Admitted 03/27/2023 for influenza A infection   Significant Labs: CMP notable for the following: Creatinine 2.73, glucose 241, calcium adjusted for mild hypoalbuminemia noted to be 10.1, albumin 2.3.  Otherwise, liver enzymes are within normal limits.  CBC notable for white cell count 7400, hemoglobin 14.1.  Influenza A a PCR is positive, will COVID, influenza B  and RSV PCR were all negative.  Significant Imaging Studies:   Antibiotic Therapy: Anti-infectives (From admission, onward)    Start     Dose/Rate Route Frequency Ordered Stop   04/22/23 1300  vancomycin (VANCOCIN) IVPB 1000 mg/200 mL premix        1,000 mg 200 mL/hr  over 60 Minutes Intravenous To Radiology 04/22/23 1110 04/22/23 1849   04/19/23 1000  ertapenem (INVANZ) 500 mg in sodium chloride 0.9 % 50 mL IVPB  Status:  Discontinued        500 mg 100 mL/hr over 30 Minutes Intravenous Daily 04/18/23 0931 04/18/23 1106   04/17/23 1800  ertapenem (INVANZ) 1 g in sodium chloride 0.9 % 100 mL IVPB  Status:  Discontinued        1 g 200 mL/hr over 30 Minutes Intravenous Daily 04/17/23 1354 04/18/23 0931   04/08/23 0000  daptomycin (CUBICIN) IVPB  Status:  Discontinued        700 mg Intravenous Every 24 hours 04/08/23 1147 04/08/23    04/08/23 0000  ceFEPime (MAXIPIME) IVPB  Status:  Discontinued        2 g Intravenous Every 12 hours 04/08/23 1147 04/18/23    04/08/23 0000  daptomycin (CUBICIN) IVPB  Status:  Discontinued        700 mg Intravenous Every 24 hours 04/08/23 1150 04/18/23    04/04/23 1400  DAPTOmycin (CUBICIN) IVPB 700 mg/166mL premix  Status:  Discontinued        700 mg 200 mL/hr over 30 Minutes Intravenous Daily 04/03/23 1435 04/18/23 1106   04/02/23 1715  ceFAZolin (ANCEF) IVPB 2g/100 mL premix  Status:  Discontinued        2 g 200 mL/hr over 30 Minutes Intravenous Every 8 hours 04/02/23 1616 04/02/23 1623   04/02/23 0600  ceFAZolin (ANCEF) IVPB 3g/150 mL premix  Status:  Discontinued        3 g 300 mL/hr over 30 Minutes Intravenous On call to O.R. 04/01/23 1700 04/02/23 1549   03/31/23 1015  ceFAZolin (ANCEF) IVPB 2g/100 mL premix        2 g 200 mL/hr over 30 Minutes Intravenous On call to O.R. 03/31/23 0921 03/31/23 1203   03/29/23 2200  vancomycin (VANCOREADY) IVPB 2000 mg/400 mL  Status:  Discontinued       Placed in "Followed by" Linked Group   2,000 mg 200 mL/hr over 120 Minutes Intravenous Every 48 hours 03/27/23 2257 04/03/23 1435   03/28/23 1045  metroNIDAZOLE (FLAGYL) tablet 500 mg  Status:  Discontinued        500 mg Oral Every 12 hours 03/28/23 0955 04/03/23 0945   03/27/23 2330  vancomycin (VANCOCIN) 2,500 mg in sodium  chloride 0.9 % 500 mL IVPB       Placed in "Followed by" Linked Group   2,500 mg 262.5 mL/hr over 120 Minutes Intravenous  Once 03/27/23 2257 03/28/23 0241   03/27/23 2300  metroNIDAZOLE (FLAGYL) IVPB 500 mg  Status:  Discontinued        500 mg 100 mL/hr over 60 Minutes Intravenous Every 12 hours 03/27/23 2247 03/28/23 0955   03/27/23 2300  ceFEPIme (MAXIPIME) 2 g in sodium chloride 0.9 % 100 mL IVPB  Status:  Discontinued        2 g 200 mL/hr over 30 Minutes Intravenous Every 12 hours 03/27/23 2257 04/17/23 1354   03/27/23 2100  ceFAZolin (ANCEF) IVPB 2g/100 mL premix        2 g 200 mL/hr  over 30 Minutes Intravenous  Once 03/27/23 8295 03/27/23 2219       Procedures: 03-31-2023 right ankle hardware removal 04-01-2023 right superficial femoral artery stenting 04-02-2023 right ankle surgical debridement 04-22-2023 HD catheter placement  Consultants: Vascular surgery Orthopedic surgery Infectious disease Nephrology Neurology IR

## 2023-04-09 NOTE — Plan of Care (Signed)
  Problem: Education: Goal: Knowledge of General Education information will improve Description: Including pain rating scale, medication(s)/side effects and non-pharmacologic comfort measures Outcome: Progressing   Problem: Health Behavior/Discharge Planning: Goal: Ability to manage health-related needs will improve Outcome: Progressing   Problem: Clinical Measurements: Goal: Ability to maintain clinical measurements within normal limits will improve Outcome: Adequate for Discharge Goal: Will remain free from infection Outcome: Adequate for Discharge Goal: Diagnostic test results will improve Outcome: Adequate for Discharge Goal: Respiratory complications will improve Outcome: Not Applicable Goal: Cardiovascular complication will be avoided Outcome: Adequate for Discharge   Problem: Activity: Goal: Risk for activity intolerance will decrease Outcome: Progressing   Problem: Nutrition: Goal: Adequate nutrition will be maintained Outcome: Adequate for Discharge   Problem: Coping: Goal: Level of anxiety will decrease Outcome: Adequate for Discharge   Problem: Elimination: Goal: Will not experience complications related to bowel motility Outcome: Adequate for Discharge Goal: Will not experience complications related to urinary retention Outcome: Adequate for Discharge   Problem: Pain Managment: Goal: General experience of comfort will improve and/or be controlled Outcome: Adequate for Discharge   Problem: Safety: Goal: Ability to remain free from injury will improve Outcome: Progressing   Problem: Skin Integrity: Goal: Risk for impaired skin integrity will decrease Outcome: Adequate for Discharge

## 2023-04-09 NOTE — Progress Notes (Addendum)
Patient refused morning dose of eliquis. Patient states he doesn't want to be on "two blood thinners." Patient did agree to take his morning dose of plavix. RN notified Dr. Rhona Leavens.  Additionally, patient became orthostatic while working with physical therapy. BP dropped to 97/71 while standing and patient was dizzy, immediately had to sit back down. Dr. Rhona Leavens was notified; RN did not give morning dose of amlodipine per instructions, but carvedilol was given as BP has now improved to 135/70 while patient lying in bed.

## 2023-04-10 ENCOUNTER — Other Ambulatory Visit (HOSPITAL_COMMUNITY): Payer: Self-pay

## 2023-04-10 DIAGNOSIS — L02415 Cutaneous abscess of right lower limb: Secondary | ICD-10-CM | POA: Diagnosis not present

## 2023-04-10 DIAGNOSIS — N179 Acute kidney failure, unspecified: Secondary | ICD-10-CM | POA: Diagnosis not present

## 2023-04-10 DIAGNOSIS — L97314 Non-pressure chronic ulcer of right ankle with necrosis of bone: Secondary | ICD-10-CM | POA: Diagnosis not present

## 2023-04-10 DIAGNOSIS — J101 Influenza due to other identified influenza virus with other respiratory manifestations: Secondary | ICD-10-CM | POA: Diagnosis not present

## 2023-04-10 LAB — COMPREHENSIVE METABOLIC PANEL
ALT: 21 U/L (ref 0–44)
AST: 21 U/L (ref 15–41)
Albumin: 2.3 g/dL — ABNORMAL LOW (ref 3.5–5.0)
Alkaline Phosphatase: 73 U/L (ref 38–126)
Anion gap: 11 (ref 5–15)
BUN: 35 mg/dL — ABNORMAL HIGH (ref 8–23)
CO2: 24 mmol/L (ref 22–32)
Calcium: 8.9 mg/dL (ref 8.9–10.3)
Chloride: 101 mmol/L (ref 98–111)
Creatinine, Ser: 2.63 mg/dL — ABNORMAL HIGH (ref 0.61–1.24)
GFR, Estimated: 26 mL/min — ABNORMAL LOW (ref >60)
Glucose, Bld: 143 mg/dL — ABNORMAL HIGH (ref 70–99)
Potassium: 3.1 mmol/L — ABNORMAL LOW (ref 3.5–5.1)
Sodium: 136 mmol/L (ref 135–145)
Total Bilirubin: 0.7 mg/dL (ref 0.0–1.2)
Total Protein: 5.8 g/dL — ABNORMAL LOW (ref 6.5–8.1)

## 2023-04-10 LAB — CBC
HCT: 28.1 % — ABNORMAL LOW (ref 39.0–52.0)
Hemoglobin: 9.8 g/dL — ABNORMAL LOW (ref 13.0–17.0)
MCH: 29.5 pg (ref 26.0–34.0)
MCHC: 34.9 g/dL (ref 30.0–36.0)
MCV: 84.6 fL (ref 80.0–100.0)
Platelets: 207 10*3/uL (ref 150–400)
RBC: 3.32 MIL/uL — ABNORMAL LOW (ref 4.22–5.81)
RDW: 14.4 % (ref 11.5–15.5)
WBC: 8.3 10*3/uL (ref 4.0–10.5)
nRBC: 0 % (ref 0.0–0.2)

## 2023-04-10 LAB — GLUCOSE, CAPILLARY
Glucose-Capillary: 136 mg/dL — ABNORMAL HIGH (ref 70–99)
Glucose-Capillary: 162 mg/dL — ABNORMAL HIGH (ref 70–99)
Glucose-Capillary: 185 mg/dL — ABNORMAL HIGH (ref 70–99)
Glucose-Capillary: 204 mg/dL — ABNORMAL HIGH (ref 70–99)
Glucose-Capillary: 217 mg/dL — ABNORMAL HIGH (ref 70–99)

## 2023-04-10 LAB — MAGNESIUM: Magnesium: 1.9 mg/dL (ref 1.7–2.4)

## 2023-04-10 MED ORDER — MAGNESIUM SULFATE 2 GM/50ML IV SOLN
2.0000 g | Freq: Once | INTRAVENOUS | Status: AC
  Administered 2023-04-10: 2 g via INTRAVENOUS
  Filled 2023-04-10: qty 50

## 2023-04-10 MED ORDER — AMLODIPINE BESYLATE 5 MG PO TABS
5.0000 mg | ORAL_TABLET | Freq: Every day | ORAL | 0 refills | Status: DC
  Filled 2023-04-10: qty 30, 30d supply, fill #0

## 2023-04-10 MED ORDER — POTASSIUM CHLORIDE CRYS ER 20 MEQ PO TBCR
40.0000 meq | EXTENDED_RELEASE_TABLET | ORAL | Status: AC
  Administered 2023-04-10 (×2): 40 meq via ORAL
  Filled 2023-04-10 (×2): qty 2

## 2023-04-10 MED ORDER — BENZONATATE 200 MG PO CAPS
200.0000 mg | ORAL_CAPSULE | Freq: Three times a day (TID) | ORAL | 0 refills | Status: DC | PRN
  Filled 2023-04-10: qty 20, 7d supply, fill #0

## 2023-04-10 MED ORDER — GABAPENTIN 400 MG PO CAPS
400.0000 mg | ORAL_CAPSULE | Freq: Three times a day (TID) | ORAL | 0 refills | Status: DC
  Filled 2023-04-10: qty 90, 30d supply, fill #0

## 2023-04-10 MED ORDER — DOCUSATE SODIUM 100 MG PO CAPS
100.0000 mg | ORAL_CAPSULE | Freq: Two times a day (BID) | ORAL | 0 refills | Status: AC
  Filled 2023-04-10: qty 60, 30d supply, fill #0

## 2023-04-10 MED ORDER — ALBUMIN HUMAN 25 % IV SOLN
25.0000 g | Freq: Once | INTRAVENOUS | Status: AC
  Administered 2023-04-10: 25 g via INTRAVENOUS
  Filled 2023-04-10: qty 100

## 2023-04-10 MED ORDER — POTASSIUM CHLORIDE CRYS ER 20 MEQ PO TBCR: 60.0000 meq | EXTENDED_RELEASE_TABLET | Freq: Once | ORAL | Status: DC

## 2023-04-10 NOTE — TOC Progression Note (Addendum)
Transition of Care [name]) - Progression Note    Patient Details  Name: Harold [name] MRN: [id number] Date of Birth: 04/15/1955  Transition of [facility] CM/SW Contact  Graves-Bigelow, Lamar Laundry, RN Phone Number: 04/10/2023, 3:17 PM  Clinical Narrative: Patient to transition home with home health services. [facility] to follow for home health and [facility] to provide the medications. No further home needs identified at this time.   [id number] 04-10-23 Case Manager notified Dr. Rhona Leavens regarding patients inability to ambulate 12-14 ft. Spouse states the house needs renovations and there is a huge hole in the kitchen and the patient will have to walk on planks. Patient has declined SNF due to co pay $600.00 daily. Case Manager recommended [facility]- PT has reached out to staff and to CIR to see if the patient will be a potential candidate. Case Manager has made [facility] and [facility] aware that the patient will not transition home today. Awaiting to hear back from  [facility] and CIR. Case Manager will continue to follow for additional needs.    Expected Discharge Plan: Home w Home Health Services Barriers to Discharge: No Barriers Identified  Expected Discharge Plan and Services In-house Referral: Clinical Social Work Discharge Planning Services: CM Consult Post Acute Care Choice: Home Health Living arrangements for the past 2 months: Single Family Home Expected Discharge Date: 04/10/23               DME Arranged: IV pump/equipment DME Agency:  [facility]ian[facility] Rice)     HH Arranged: RN, Disease Management, PT, OT, Nurse's Aide HH Agency: [facility] ([facility])   Social Determinants of Health (SDOH) Interventions SDOH Screenings   Food Insecurity: No Food Insecurity (03/27/2023)  Housing: Low Risk  (03/28/2023)  Transportation Needs: No Transportation Needs (03/27/2023)  Utilities: Not At Risk (03/28/2023)  Social Connections: Unknown (03/28/2023)  Tobacco Use: Medium Risk (04/02/2023)    Readmission Risk Interventions     No data to display

## 2023-04-10 NOTE — Progress Notes (Signed)
Pharmacy Antibiotic Note  Harold [name] is a 68 y.o. 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 Plan for daptomycin and cefepime per ID. WBC is 8.3. Last CK was 37 - okay to continue statin for now with recent PV stent. Scr up slightly to 2.63 but improved from 3.16 yesterday.   Plan: Continue Cefepime 2g q12hr Continue Daptomycin 700 mg q24hr  Will follow renal function, cultures and clinical progress   Height: 6\' 1"  (185.4 cm) Weight: 129.8 kg (286 lb 1.6 oz) IBW/kg (Calculated) : 79.9  Temp (24hrs), Avg:98 F (36.7 C), Min:97.7 F (36.5 C), Max:98.2 F (36.8 C)  Recent Labs  Lab 04/05/23 0427 04/06/23 0810 04/07/23 0411 04/09/23 0209 04/10/23 0429 04/10/23 0750  WBC  --   --  8.2 7.8  --  8.3  CREATININE 2.47* 2.30* 2.59* 3.16* 2.63*  --     Estimated Creatinine Clearance: 37.5 mL/min (A) (by C-G formula based on SCr of 2.63 mg/dL (H)).    Allergies  Allergen Reactions   Doxycycline Hives and Rash   Septra [Bactrim] Itching   Penicillins Rash    Allergy to All cillin drugs  Ancef given 9/24 with no obvious reaction   Metformin And Related Diarrhea and Nausea Only   Other Nausea And Vomiting    General Anesthesia - sometimes causes nausea and vomiting * OK to use scopolamine patch     Sulfamethoxazole-Trimethoprim Other (See Comments)   Wellbutrin [Bupropion]     Mood Changes    Thank you for allowing pharmacy to participate in this patient's care,  Sherron Monday, PharmD, BCCCP Clinical Pharmacist  Phone: [phone] 04/10/2023 10:54 AM  Please check AMION for all [facility] phone numbers After 10:00 PM, call Main Pharmacy [phone]

## 2023-04-10 NOTE — Progress Notes (Signed)
  Inpatient Rehab Admissions Coordinator :  Per therapy and TOC inquiry, patient was screened for CIR candidacy by Ottie Glazier RN MSN. Noted patient does not want SNF due to his copays. Home is undergoing renovations with a hole in kitchen floor. I do not feel that patient's payor will approve AIR level rehab for his current diagnosis nor has he demonstrated the ability for the intensity required of a CIR admit.CIR also would also have co pays.Recommend other options to be pursued. Please contact me with any questions.  Ottie Glazier RN MSN Admissions Coordinator [phone]

## 2023-04-10 NOTE — Progress Notes (Signed)
Physical Therapy Treatment Patient Details Name: Harold [name] MRN: [id number] DOB: 11/02/1955 Today's Date: 04/10/2023   History of Present Illness 68 y.o. male presents to [facility] 03/27/23 with productive cough and generalized weakness. Admitted with influenza A and cellulitis of R foot s/p hardware removal of the R ankle and application of wound vac on 2/10. Prior admit 11/24 for R ankle ORIF and in 12/24 for revision. S/p excisional debridement of right ankle on 2/12. Pt underwent central line placement on 04/07/2023 however he has been having issues with recurrent bleeding from the insertion site which has finally stopped. Significant PMH: HTN, HLD, CAD s/p PCI, COPD, DM2, CKD stage IIIb, PVD, gout, OSA, neuropathy, COPD, a-fib, CAD s/p percutaneous coronary angioplasty, angina, obesity    PT Comments  Session focused on gait training to promote ambulation in the home and community and theract to promote  functional mobility and pt/family education in preparation for d/c. Pt and family were provided education regarding CAM boot don/doffing, safe transfers, home navigation, home functional mobility and places to rest. Pt and family responded with verbal understanding and expressed concerns particularly around home navigation/functional mobility given the size of their hallways and distance the pt would need to walk to reach the restroom and bedroom. Pt showed improvements in his bed mobility, transfers, and gait by either increasing distance ambulated or decreasing assistance required to complete the task. Pt continues to have difficulty with activity tolerance, LE and UE strength, functional mobility, gait, and dynamic balance and would benefit from continued acute PT to address these impairments. Given pt's level of function, home support and setup, he would benefit from the [facility] program for more frequent acute PT prior to d/c.  Will continue to monitor acutely.   Pt's BP Supine: 140/67 (HR  62) EOB: 119/78 (HR 68) Standing: 161/124  After ambulation, sitting: 123/82 Seated recliner, end of session: 124/70 (HR 70)   If plan is discharge home, recommend the following: A lot of help with walking and/or transfers;A lot of help with bathing/dressing/bathroom;Assistance with cooking/housework;Assist for transportation;Help with stairs or ramp for entrance   Can travel by private vehicle     Yes  Equipment Recommendations  BSC/3in1 (has tub bench, w/c, RW, and sliding board already)    Recommendations for Other Services       Precautions / Restrictions Precautions Precautions: Fall;Other (comment) (Weightbearing) Recall of Precautions/Restrictions: Intact Required Braces or Orthoses: Other Brace Other Brace: R CAM boot for ambulation, R splint in bed Restrictions Weight Bearing Restrictions Per Provider Order: Yes     Mobility  Bed Mobility Overal bed mobility: Needs Assistance Bed Mobility: Supine to Sit     Supine to sit: HOB elevated, Used rails, Supervision     General bed mobility comments: Pt requires extra time to come to EOB, uses bil UEs and momentum    Transfers Overall transfer level: Needs assistance Equipment used: Rolling walker (2 wheels)   Sit to Stand: From elevated surface, Min assist, +2 safety/equipment   Step pivot transfers: +2 safety/equipment, From elevated surface, Min assist       General transfer comment: Pt performs step pivot with RW and CAM boot. min A for power up from bed & w/c and cueing for UE weight distribution.    Ambulation/Gait Ambulation/Gait assistance: Contact guard assist, Min assist, +2 safety/equipment Gait Distance (Feet): 20 Feet (2 bouts, 1st 14 ft with Rw; 2nd 6 ft with RW) Assistive device: Rolling walker (2 wheels) Gait Pattern/deviations: Step-to pattern, Decreased stride  length, Shuffle, Drifts right/left, Trunk flexed Gait velocity: reduced Gait velocity interpretation: <1.31 ft/sec, indicative of  household ambulator   General Gait Details: Pt ambulates with reported 20% Weight bearing of R LE. Required min A cueing for UE weight distribution during R LE stance phase, intermittent RW management, and maintain a closer proximity to RW. Pt's UEs began to fatigue and required rest breaks   Stairs             Wheelchair Mobility     Tilt Bed    Modified Rankin (Stroke Patients Only)       Balance Overall balance assessment: Needs assistance Sitting-balance support: No upper extremity supported, Feet supported Sitting balance-Leahy Scale: Good Sitting balance - Comments: Pt sits EOB without UEs and has both feet supported. No LOB Postural control: Posterior lean Standing balance support: Bilateral upper extremity supported, During functional activity, Reliant on assistive device for balance Standing balance-Leahy Scale: Poor                              Communication Communication Communication: No apparent difficulties  Cognition Arousal: Alert Behavior During Therapy: WFL for tasks assessed/performed   PT - Cognitive impairments: No apparent impairments                         Following commands: Intact      Cueing Cueing Techniques: Verbal cues  Exercises      General Comments General comments (skin integrity, edema, etc.): VSS throughout session. Pt reported some dizziness with positional changes      Pertinent Vitals/Pain Pain Assessment Pain Assessment: Faces Faces Pain Scale: No hurt Pain Intervention(s): Monitored during session    Home Living Family/patient expects to be discharged to:: Private residence                        Prior Function            PT Goals (current goals can now be found in the care plan section) Acute Rehab PT Goals Patient Stated Goal: Improve mobility and get home PT Goal Formulation: With patient/family Time For Goal Achievement: 04/17/23 Potential to Achieve Goals:  Good Progress towards PT goals: Progressing toward goals    Frequency    Min 1X/week      PT Plan      Co-evaluation              AM-PAC PT "6 Clicks" Mobility   Outcome Measure  Help needed turning from your back to your side while in a flat bed without using bedrails?: A Lot Help needed moving from lying on your back to sitting on the side of a flat bed without using bedrails?: A Lot Help needed moving to and from a bed to a chair (including a wheelchair)?: A Little Help needed standing up from a chair using your arms (e.g., wheelchair or bedside chair)?: A Little Help needed to walk in hospital room?: Total Help needed climbing 3-5 steps with a railing? : Total 6 Click Score: 12    End of Session Equipment Utilized During Treatment: Gait belt Activity Tolerance: Patient limited by fatigue Patient left: in chair;with call bell/phone within reach;with family/visitor present Nurse Communication: Mobility status;Other (comment) (notified RN of no chair alarm box in room) PT Visit Diagnosis: Other abnormalities of gait and mobility (R26.89);Unsteadiness on feet (R26.81);Muscle weakness (generalized) (M62.81);History of falling (Z91.81);Difficulty in  walking, not elsewhere classified (R26.2)     Time: 5409-8119 PT Time Calculation (min) (ACUTE ONLY): 58 min  Charges:    $Gait Training: 23-37 mins $Therapeutic Activity: 23-37 mins PT General Charges $$ ACUTE PT VISIT: 1 Visit                     [address], SPT    [place] 04/10/2023, 6:42 PM

## 2023-04-10 NOTE — Progress Notes (Signed)
Progress Note   Patient: Harold [name] KZS:[id number] DOB: [birth year] DOA: 03/27/2023     14 DOS: the patient was seen and examined on 04/10/2023   Brief hospital course: 68 y.o. male with PMH significant for morbid obesity, OSA, DM2, HTN, HLD, CAD/stent, A-fib on Eliquis, carotid artery stenosis, CKD, chronic anemia, anxiety/depression, diabetic neuropathy, GERD, hypothyroidism, COPD, pulmonary hypertension. 2/6, patient presented to the ED with complaint of cough, subjective fever, myalgia, generalized weakness for 5 days. Respiratory virus panel positive for influenza A. Chest x-ray negative for infiltrates. Out of for Tamiflu.  He improved from fluid with supportive care.   On admission, he was also noted to have right leg cellulitis. Per chart review, in November 2024, he underwent ORIF of the right ankle, with ensuing revision in December 2024.  He has subsequently been followed by wound care at home.  However over the course the last 1 week, patient had progressive right lateral malleolus erythema, swelling, tenderness, with some evidence of purulent drainage, X-ray showed soft tissue swelling most pronounced over the lateral malleolus, consistent with cellulitis, without evidence of subcutaneous gas or crepitus to suggest necrotizing fasciitis.  Orthopedics and vascular surgery consult appreciated.  Assessment and Plan: Right leg wound infection -Prior right ankle surgery (12/2022) complicated by postsurgical wound infection. S/p right ankle hardware removal, I&D and wound VAC placement -2/10 Dr. Jena Gauss In the setting of uncontrolled diabetes, and possible PAD.  S/p repeat debridement of the right ankle by Dr. Lajoyce Corners 04/02/2023.  Seen by ID [id number], antibiotics transitioned to cefepime and daptomycin.   Wound culture grew polymicrobial organisms.  ID consulted and recommended continuing current cefepime and daptomycin for 4 weeks since surgery EOT 05/12/2023 with weekly CK as outpatient..  Central line placed on 04/07/2023 with some post-procedural bleed noted.   Seen by PT OT, they recommended SNF, however patient will have to pay out-of-pocket for the co-pay which patient is unable to afford, ultimate decision to d/c home at time of d/c -Home setting was later found to be not safe. Per TOC, plan to enroll pt in [facility] program here   PAD Seen by vascular surgery.2/11, underwent aortogram, right leg angiogram, successful stenting of right superficial femoral artery.  Patient on Plavix and Eliquis which was resumed on 04/03/2023 after cleared by orthopedics.   Acute diarrhea Resolved   Influenza A: Presented with 5 days of cough, fever, myalgia. Respiratory virus panel positive for influenza A. Chest x-ray without acute infiltrate. No fever, no WBC count Was outside the 48-hour window for Tamiflu and hence was not initiated.  Doing well on supportive care.   Type 2 diabetes mellitus / Diabetic neuropathy, POA: A1c 7 on 01/02/2023. PTA meds-Tresiba 60 units daily, Mounjaro Currently on Lantus 35 units daily and SSI/Accu-Cheks.  Blood sugar slightly elevated again.  Will increase Semglee to 40 units today.  PTA reportedly on gabapentin 1600 mg 3 times daily. Since dose was unable to be confirmed, pt was continued on 400 mg 3 times daily instead   Hypokalemia/hypomagnesemia: Resolved.   Paroxysmal A-fib Rates controlled on PTA meds Coreg, amiodarone and Eliquis.   Essential hypertension PTA meds- Coreg 3.125 mg twice daily, Lasix 160 mg daily, currently on home dose of Coreg with amlodipine 5 mg added -Lasix was increased from 40mg  bid to 80mg  bid on 2/14, however with  resultant increase of Cr up to 3.16. Lasix subsequently held -Cr has since improved with gentle oral hydration   Anemia of chronic disease: Baseline hemoglobin between  10.5 11.5.  Checking CBC today since he has had significant bleeding from the central line insertion site.   CAD/stent Carotid artery  stenosis HLD Continue PTA meds -Eliquis, Plavix, Crestor   Hypothyroidism Continue Synthroid   Morbid Obesity  Body mass index is 39.99 kg/m. Patient has been advised to make an attempt to improve diet and exercise patterns to aid in weight loss.   Obstructive sleep apnea Patient reported good compliance on home nocturnal CPAP   Gout Allopurinol, colchicine   Depression Cymbalta   AKI on CKD stage IIIb: Baseline creatinine appears to be 1.9-2.2.  Creatinine peaked to 3.14 that started to trend up after increasing lasix dose to 80mg  -Lasix now held -Allow pt to self-hydrate given propensity for developing edema in the setting of CKD and heart disease -Cr has improved with oral hydration -cont to follow bmet trends   Subjective: No complaints today  Physical Exam: Vitals:   04/10/23 0813 04/10/23 0845 04/10/23 1405 04/10/23 1735  BP: (!) 163/59 (!) 163/59 (!) 143/56 (!) 143/56  Pulse: 64 64 64 61  Resp: 18  18   Temp: 98.2 F (36.8 C)  97.9 F (36.6 C)   TempSrc: Axillary  Axillary   SpO2:      Weight:      Height:       General exam: Conversant, in no acute distress Respiratory system: normal chest rise, clear, no audible wheezing Cardiovascular system: regular rhythm, s1-s2 Gastrointestinal system: Nondistended, nontender, pos BS Central nervous system: No seizures, no tremors Extremities: No cyanosis, no joint deformities Skin: No rashes, no pallor Psychiatry: Affect normal // no auditory hallucinations   Data Reviewed:  Labs reviewed: Na 136, K 3.1, Cr 2.63, Mg 1.9, Hgb 9.8  Family Communication: Pt in room, family not at bedside  Disposition: Status is: Inpatient Remains inpatient appropriate because: Severity of illness  Planned Discharge Destination: Home    Author: Rickey Barbara, MD 04/10/2023 6:18 PM  For on call review [email or web address].

## 2023-04-10 NOTE — Care Management Important Message (Signed)
Important Message  Patient Details  Name: Harold [name] MRN: [id number] Date of Birth: [birth year]   Important Message Given:  Yes - Medicare IM     Renie Ora 04/10/2023, 11:02 AM

## 2023-04-10 NOTE — Discharge Summary (Incomplete)
Physician Discharge Summary   Patient: Harold [name] MRN: [id number] DOB: Sep 04, 1955  Admit date:     03/27/2023  Discharge date: 04/10/23  Discharge Physician: Rickey Barbara   PCP: [name], MD   Recommendations at discharge:  {Tip this will not be part of the note when signed- Example include specific recommendations for outpatient follow-up, pending tests to follow-up on. 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 *  Hospital Course: 68 y.o. male with PMH significant for morbid obesity, OSA, DM2, HTN, HLD, CAD/stent, A-fib on Eliquis, carotid artery stenosis, CKD, chronic anemia, anxiety/depression, diabetic neuropathy, GERD, hypothyroidism, COPD, pulmonary hypertension. 2/6, patient presented to the ED with complaint of cough, subjective fever, myalgia, generalized weakness for 5 days. Respiratory virus panel positive for influenza A. Chest x-ray negative for infiltrates. Out of for Tamiflu.  He improved from fluid with supportive care.   On admission, he was also noted to have right leg cellulitis. Per chart review, in November 2024, he underwent ORIF of the right ankle, with ensuing revision in December 2024.  He has subsequently been followed by wound care at home.  However over the course the last 1 week, patient had progressive right lateral  malleolus erythema, swelling, tenderness, with some evidence of purulent drainage, X-ray showed soft tissue swelling most pronounced over the lateral malleolus, consistent with cellulitis, without evidence of subcutaneous gas or crepitus to suggest necrotizing fasciitis.  Orthopedics and vascular surgery consult appreciated.  Assessment and Plan: Right leg wound infection -Prior right ankle surgery (12/2022) complicated by postsurgical wound infection. S/p right ankle hardware removal, I&D and wound VAC placement -2/10 Dr. Jena Gauss In the setting of uncontrolled diabetes, and possible PAD.  S/p repeat debridement of the right ankle by Dr. Lajoyce Corners 04/02/2023.  Seen by ID [id number], antibiotics transitioned to cefepime and daptomycin.   Wound culture grew polymicrobial organisms.  ID consulted and recommended continuing current cefepime and daptomycin for 4 weeks since surgery EOT 05/12/2023 with weekly CK as outpatient.. Central line placed on 04/07/2023 with some post-procedural bleed noted.   Seen by PT OT, they recommended SNF, however patient will have to pay out-of-pocket for the co-pay which patient is unable to afford, ultimate decision to d/c home at time of d/c   PAD Seen by vascular surgery.2/11, underwent aortogram, right leg angiogram, successful stenting of right superficial femoral artery.  Patient on Plavix and Eliquis which was resumed on 04/03/2023 after cleared by orthopedics.   Acute diarrhea Resolved   Influenza A: Presented with 5 days of cough, fever, myalgia. Respiratory virus panel positive for influenza A. Chest x-ray without acute infiltrate. No fever, no WBC count Was outside the 48-hour window for Tamiflu and hence was not initiated.  Doing well on supportive care.   Type 2 diabetes mellitus / Diabetic neuropathy, POA: A1c 7 on 01/02/2023. PTA  meds-Tresiba 60 units daily, Mounjaro Currently on Lantus 35 units daily and SSI/Accu-Cheks.  Blood sugar slightly elevated again.  Will  increase Semglee to 40 units today.  PTA reportedly on gabapentin 1600 mg 3 times daily. Since dose was unable to be confirmed, pt was continued on 400 mg 3 times daily instead   Hypokalemia/hypomagnesemia: Resolved.   Paroxysmal A-fib Rates controlled on PTA meds Coreg, amiodarone and Eliquis.   Essential hypertension PTA meds- Coreg 3.125 mg twice daily, Lasix 160 mg daily, currently on home dose of Coreg with amlodipine 5 mg added -Lasix was increased from 40mg  bid to 80mg  bid on 2/14, however with  resultant increase of Cr up to 3.16. Lasix subsequently held   Anemia of chronic disease: Baseline hemoglobin between 10.5 11.5.  Checking CBC today since he has had significant bleeding from the central line insertion site.   CAD/stent Carotid artery stenosis HLD Continue PTA meds -Eliquis, Plavix, Crestor   Hypothyroidism Continue Synthroid   Morbid Obesity  Body mass index is 39.99 kg/m. Patient has been advised to make an attempt to improve diet and exercise patterns to aid in weight loss.   Obstructive sleep apnea Patient reported good compliance on home nocturnal CPAP   Gout Allopurinol, colchicine   Depression Cymbalta   AKI on CKD stage IIIb: Baseline creatinine appears to be 1.9-2.2.  Creatinine peaked to 3.14 that started to trend up after increasing lasix dose to 80mg  -Lasix now held -Allow pt to self-hydrate given propensity for developing edema in the setting of CKD and heart disease -recheck bmet in AM   {(NOTE) Pain control PDMP Statment (Optional):26782} Consultants: Orthopedic Surgery, Vascular Surgery, ID Procedures performed: debridement R ankle , R leg angiogram, stent to R superficial fem artery Disposition: Home Diet recommendation:  Carb modified diet DISCHARGE MEDICATION: Allergies as of 04/10/2023       Reactions   Doxycycline Hives, Rash   Septra [bactrim] Itching   Penicillins Rash   Allergy to All cillin drugs  Ancef given 9/24 with no  obvious reaction   Metformin And Related Diarrhea, Nausea Only   Other Nausea And Vomiting   General Anesthesia - sometimes causes nausea and vomiting * OK to use scopolamine patch     Sulfamethoxazole-trimethoprim Other (See Comments)   Wellbutrin [bupropion]    Mood Changes        Medication List     PAUSE taking these medications    furosemide 80 MG tablet Wait to take this until: April 12, 2023 Commonly known as: LASIX Take 0.5 tablets (40 mg total) by mouth daily. May take additional tablet for weight gain of 3 pounds in one day or five pounds in one week       STOP taking these medications    gabapentin 800 MG tablet Commonly known as: NEURONTIN Replaced by: gabapentin 400 MG capsule       TAKE these medications    acetaminophen 500 MG tablet Commonly known as: TYLENOL Take 1 tablet (500 mg total) by mouth every 6 (six) hours as needed for mild pain (pain score 1-3).   allopurinol 100 MG tablet Commonly known as: ZYLOPRIM Take 1 tablet (100 mg total) by mouth daily. What changed: when to take this   amiodarone 200 MG tablet Commonly known as: PACERONE Take 1 tablet (200 mg total) by mouth daily. What changed: when to take this   amLODipine 5 MG tablet Commonly known as: NORVASC Take 1 tablet (5 mg total) by mouth daily. Start  taking on: April 11, 2023   B-D ULTRAFINE III SHORT PEN 31G X 8 MM Misc Generic drug: Insulin Pen Needle Inject 1 each into the skin 3 (three) times daily.   benzonatate 200 MG capsule Commonly known as: TESSALON Take 1 capsule (200 mg total) by mouth 3 (three) times daily as needed for cough.   carvedilol 3.125 MG tablet Commonly known as: COREG TAKE 1 TABLET BY MOUTH TWICE A DAY WITH A MEAL   ceFEPime IVPB Commonly known as: MAXIPIME Inject 2 g into the vein every 12 (twelve) hours. Indication:  R-foot osteo First Dose: Yes Last Day of Therapy:  05/12/23 Labs - Once weekly:  CBC/D and BMP, Labs - Once weekly:  ESR and CRP Method of administration: IV Push Method of administration may be changed at the discretion of home infusion pharmacist based upon assessment of the patient and/or caregiver's ability to self-administer the medication ordered.   clopidogrel 75 MG tablet Commonly known as: PLAVIX Take 75 mg by mouth every morning.   colchicine 0.6 MG tablet Take 0.6 mg by mouth daily.   daptomycin IVPB Commonly known as: CUBICIN Inject 700 mg into the vein daily. Indication:  R-foot osteo First Dose: Yes Last Day of Therapy:  05/12/23 Labs - Once weekly:  CBC/D, BMP, ESR and CRP Labs - Twice weekly: CPK Method of administration: IV Push Method of administration may be changed at the discretion of home infusion pharmacist based upon assessment of the patient and/or caregiver's ability to self-administer the medication ordered.   docusate sodium 100 MG capsule Commonly known as: COLACE Take 1 capsule (100 mg total) by mouth 2 (two) times daily.   DULoxetine 60 MG capsule Commonly known as: CYMBALTA Take 60 mg by mouth daily.   Eliquis 5 MG Tabs tablet Generic drug: apixaban TAKE 1 TABLET BY MOUTH TWICE A DAY   ezetimibe 10 MG tablet Commonly known as: ZETIA Take 10 mg by mouth daily.   FreeStyle Libre 14 Day Sensor Misc APPLY 1 SENSOR EVERY 14 DAYS   gabapentin 400 MG capsule Commonly known as: NEURONTIN Take 1 capsule (400 mg total) by mouth 3 (three) times daily. Replaces: gabapentin 800 MG tablet   HumuLIN R U-500 KwikPen 500 UNIT/ML KwikPen Generic drug: insulin regular human CONCENTRATED Inject 90-180 Units into the skin in the morning and at bedtime.   levothyroxine 88 MCG tablet Commonly known as: SYNTHROID Take 88 mcg by mouth daily before breakfast.   loratadine 10 MG tablet Commonly known as: CLARITIN Take 1 tablet (10 mg total) by mouth daily.   Mounjaro 15 MG/0.5ML Pen Generic drug: tirzepatide Inject 15 mg into the skin once a week. What changed:  additional instructions   multivitamin with minerals Tabs tablet Take 1 tablet by mouth daily.   nitroGLYCERIN 0.4 MG SL tablet Commonly known as: NITROSTAT PLACE 1 TAB UNDER THE TONGUE EVERY 5 MINUTES AS NEEDED FOR CHEST PAIN (SEVERE PRESSURE OR TIGHTNESS)   pantoprazole 40 MG tablet Commonly known as: PROTONIX TAKE 1 TABLET BY MOUTH EVERY DAY   potassium chloride 10 MEQ CR capsule Commonly known as: MICRO-K Take 10 mEq by mouth daily.   PRESCRIPTION MEDICATION Pt uses CPAP Machine at bedtime   rosuvastatin 20 MG tablet Commonly known as: CRESTOR Take 20 mg by mouth every evening.   Sodium Fluoride 5000 PPM 1.1 % Pste Generic drug: Sodium Fluoride Take 1 Application by mouth in the morning and at bedtime.   traZODone 100 MG tablet Commonly known as:  DESYREL Take 100 mg by mouth at bedtime.   Evaristo Bury FlexTouch 200 UNIT/ML FlexTouch Pen Generic drug: insulin degludec Inject 60 Units into the skin daily.               Durable Medical Equipment  (From admission, onward)           Start     Ordered   04/03/23 0828  For home use only DME Vac  Once       Comments: Attached the wound VAC dressing to the Prevena plus portable wound VAC pump for discharge.  Show patient how to plug this and to keep it charged.   04/03/23 0827              Discharge Care Instructions  (From admission, onward)           Start     Ordered   04/08/23 0000  Change dressing on IV access line weekly and PRN  (Home infusion instructions - [facility] )        04/08/23 1147            Follow-up Information     [facility] Follow up in 1 month(s).   Why: The office will call you with your appointment Contact information: [address] [place] [address] [phone]        Nadara Mustard, MD Follow up in 1 week(s).   Specialty: Orthopedic Surgery Contact information: [address] [place]  [address] [phone]         [facility] Follow up.   Why: Home health provider they will call to reset up services Contact information: [address] [place] [address] [phone]         [name], MD Follow up in 2 week(s).   Specialty: Internal Medicine Why: Hospital follow up Contact information: [address] [place] [address] [phone]                Discharge Exam: Ceasar Mons Weights   04/08/23 0546 04/09/23 0610 04/10/23 0435  Weight: 131.3 kg 129.9 kg 129.8 kg   General exam: Awake, laying in bed, in nad Respiratory system: Normal respiratory effort, no wheezing Cardiovascular system: regular rate, s1, s2 Gastrointestinal system: Soft, nondistended, positive BS Central nervous system: CN2-12 grossly intact, strength intact Extremities: Perfused, no clubbing Skin: Normal skin turgor, no notable skin lesions seen Psychiatry: Mood normal // no visual hallucinations   Condition at discharge: fair  The results of significant diagnostics from this hospitalization (including imaging, microbiology, ancillary and laboratory) are listed below for reference.   Imaging Studies: IR Fluoro Guide CV Line Right Result Date: 04/07/2023 INDICATION: 68 year old with osteomyelitis and needs a tunneled central line for antibiotics. EXAM: FLUOROSCOPIC AND ULTRASOUND GUIDED PLACEMENT OF A TUNNELED CENTRAL VENOUS CATHETER Physician: Rachelle Hora. Lowella Dandy, MD FLUOROSCOPY TIME:  Radiation Exposure Index (as provided by the fluoroscopic device): 4 mGy Kerma MEDICATIONS: 1% lidocaine ANESTHESIA/SEDATION: None PROCEDURE: Informed consent was obtained for placement of a tunneled central venous catheter. The patient was placed supine on the interventional table. Ultrasound confirmed a patent right internal jugularvein. Ultrasound images were obtained for documentation. The right neck and chest was prepped and draped in a sterile fashion. The right  neck was anesthetized with 1% lidocaine. Maximal barrier sterile technique was utilized including caps, mask, sterile gowns, sterile gloves, sterile drape, hand hygiene and skin antiseptic. A small incision was made with #  11 blade scalpel. A 21 gauge needle directed into the right internal jugular vein with ultrasound guidance. A micropuncture dilator set was placed. A single lumen Powerline catheter was selected. The skin below the right clavicle was anesthetized and a small incision was made with an #11 blade scalpel. A subcutaneous tunnel was formed to the vein dermatotomy site. The catheter was brought through the tunnel. The vein dermatotomy site was dilated to accommodate a peel-away sheath. The catheter was placed through the peel-away sheath and directed into the central venous structures. The tip of the catheter was placed at the superior cavoatrial junction with fluoroscopy. Fluoroscopic images were obtained for documentation. Lumen aspirated and flushed well. Lumen was flushed with saline. The vein dermatotomy site was closed using Dermabond. The catheter was secured to the skin using Prolene suture. FINDINGS: Catheter tip at the superior cavoatrial junction. Catheter length = 26 cm. COMPLICATIONS: None IMPRESSION: Successful placement of a right jugular tunneled central venous catheter using ultrasound and fluoroscopic guidance. Electronically Signed   By: Richarda Overlie M.D.   On: 04/07/2023 20:18   IR US Guide Vasc Access Right Result Date: 04/07/2023 INDICATION: 68 year old with osteomyelitis and needs a tunneled central line for antibiotics. EXAM: FLUOROSCOPIC AND ULTRASOUND GUIDED PLACEMENT OF A TUNNELED CENTRAL VENOUS CATHETER Physician: Rachelle Hora. Lowella Dandy, MD FLUOROSCOPY TIME:  Radiation Exposure Index (as provided by the fluoroscopic device): 4 mGy Kerma MEDICATIONS: 1% lidocaine ANESTHESIA/SEDATION: None PROCEDURE: Informed consent was obtained for placement of a tunneled central venous catheter. The  patient was placed supine on the interventional table. Ultrasound confirmed a patent right internal jugularvein. Ultrasound images were obtained for documentation. The right neck and chest was prepped and draped in a sterile fashion. The right neck was anesthetized with 1% lidocaine. Maximal barrier sterile technique was utilized including caps, mask, sterile gowns, sterile gloves, sterile drape, hand hygiene and skin antiseptic. A small incision was made with #11 blade scalpel. A 21 gauge needle directed into the right internal jugular vein with ultrasound guidance. A micropuncture dilator set was placed. A single lumen Powerline catheter was selected. The skin below the right clavicle was anesthetized and a small incision was made with an #11 blade scalpel. A subcutaneous tunnel was formed to the vein dermatotomy site. The catheter was brought through the tunnel. The vein dermatotomy site was dilated to accommodate a peel-away sheath. The catheter was placed through the peel-away sheath and directed into the central venous structures. The tip of the catheter was placed at the superior cavoatrial junction with fluoroscopy. Fluoroscopic images were obtained for documentation. Lumen aspirated and flushed well. Lumen was flushed with saline. The vein dermatotomy site was closed using Dermabond. The catheter was secured to the skin using Prolene suture. FINDINGS: Catheter tip at the superior cavoatrial junction. Catheter length = 26 cm. COMPLICATIONS: None IMPRESSION: Successful placement of a right jugular tunneled central venous catheter using ultrasound and fluoroscopic guidance. Electronically Signed   By: Richarda Overlie M.D.   On: 04/07/2023 20:18   IR PATIENT EVAL TECH 0-60 MINS Result Date: 04/07/2023 Jasmine December, RT     04/07/2023  2:04 PM RN called IR because Patient's freshly placed tunneled central venous catheter was bleeding profusely from site. This IR tech went to the floor and held pressure on the  site for approximately 10 minutes. Bleeding appears to have stopped. Quikclot was applied to catheter site, and gauze and tegaderm was placed. RN was notified to remove Quikclot after 24 hours.  PERIPHERAL VASCULAR CATHETERIZATION  Result Date: 04/01/2023 Images from the original result were not included. hospital Patient name: Harold [name] MRN: [id number] DOB: [birth year] Sex: male 04/01/2023 Pre-operative Diagnosis: Right leg wound Post-operative diagnosis:  Same Surgeon:  Durene Cal Procedure Performed:  1.  Ultrasound-guided access, left femoral artery  2.  Abdominal aortogram with CO2  3.  Selective injection right superficial femoral artery  4.  Right leg angiogram  5.  Stent, right superficial femoral artery  6.  Conscious sedation, 50 minutes  7.  Closure device, Mynx Indications: This is a 68 year old gentleman with infected hardware in his right leg requiring removal.  He has a wound there.  He has known arterial insufficiency.  He comes in for further evaluation and possible intervention Procedure:  The patient was identified in the holding area and taken to room 8.  The patient was then placed supine on the table and prepped and draped in the usual sterile fashion.  A time out was called.  Conscious sedation was administered with the use of IV fentanyl and Versed under continuous physician and nurse monitoring.  Heart rate, blood pressure, and oxygen saturation were continuously monitored.  Total sedation time was 50 minutes.  Ultrasound was used to evaluate the left common femoral artery.  It was patent .  A digital ultrasound image was acquired.  A micropuncture needle was used to access the left common femoral artery under ultrasound guidance.  An 018 wire was advanced without resistance and a micropuncture sheath was placed.  The 018 wire was removed and a benson wire was placed.  The micropuncture sheath was exchanged for a 5 french sheath.  An omniflush catheter was advanced over the wire  to the level of L-1.  An abdominal angiogram with CO2 was obtained.  Next, using the omniflush catheter and a benson wire, the aortic bifurcation was crossed and the catheter was placed into theright external iliac artery and right runoff was obtained.  Additional images of the right leg were obtained using contrast with the catheter in the right superficial femoral artery Findings:  Aortogram: No significant renal artery stenosis.  The infrarenal abdominal aorta is widely patent without stenosis.  Bilateral common and external iliac arteries are widely patent.  Bilateral common femoral arteries are widely patent.  Right Lower Extremity: The right common femoral and profundofemoral artery are widely patent without stenosis.  The superficial femoral artery is patent throughout its course however there is a 10 cm segment in the adductor canal that is heavily calcified with greater than 70% stenosis.  Popliteal artery is patent throughout its course.  There is two-vessel runoff via the posterior tibial and peroneal.  The dominant is the posterior tibial.  There is reconstitution of a distal dorsalis pedis.  Left Lower Extremity: Not evaluated Intervention: After the above images were acquired the decision was made to proceed with intervention.  A 6 French 45 cm sheath was advanced into the right external iliac artery.  I then advanced a versa core wire across the stenosis in the adductor canal.  The patient was fully heparinized.  I elected to primarily stent the lesions.  A 7 x 150 Eluvia stent was deployed and postdilated with a 6 mm balloon.  Completion imaging revealed resolution of the stenosis.  The groin was closed with a Mynx Impression:  #1  Successful stenting of a diffuse moderate length stenosis in the right adductor canal using a 7 x 150 Eluvia stent  #2  Two-vessel runoff via the posterior tibial  and peroneal artery.  The posterior tibial is the dominant vessel  #3  If the patient has difficulty with  wound healing, he could be considered for anterior tibial intervention  V. Durene Cal, M.D., [facility] [place] Office: [phone] Pager:  [phone]   DG Ankle Complete Right Result Date: 03/31/2023 CLINICAL DATA:  Elective surgery. EXAM: RIGHT ANKLE - COMPLETE 3+ VIEW COMPARISON:  Preoperative imaging. FINDINGS: Four fluoroscopic spot views of the right ankle submitted from the operating room. Distal tibia and fibular hardware, subsequently removed. Fluoroscopy time 28.8 seconds. Dose 0.78 mGy. IMPRESSION: Intraoperative fluoroscopy during right ankle surgery. Electronically Signed   By: Narda Rutherford M.D.   On: 03/31/2023 12:26   DG C-Arm 1-60 Min-No Report Result Date: 03/31/2023 Fluoroscopy was utilized by the requesting physician.  No radiographic interpretation.   CT ANKLE RIGHT WO CONTRAST Result Date: 03/30/2023 CLINICAL DATA:  Trimalleolar fracture status post repair EXAM: CT OF THE RIGHT ANKLE WITHOUT CONTRAST TECHNIQUE: Multidetector CT imaging of the right ankle was performed according to the standard protocol. Multiplanar CT image reconstructions were also generated. RADIATION DOSE REDUCTION: This exam was performed according to the departmental dose-optimization program which includes automated exposure control, adjustment of the mA and/or kV according to patient size and/or use of iterative reconstruction technique. COMPARISON:  03/28/2023 FINDINGS: Bones/Joint/Cartilage Postsurgical changes from trimalleolar fracture repair again noted. Plate and screw fixation traverses a healing segmental distal fibular diaphyseal fracture, with moderate callus formation along the fracture lines. The 3 inferior screws traverse the tibiofibular syndesmosis. There are 2 cannulated screws traversing the medial malleolar fracture, with only minimal bony callus identified. The fracture line is still readily apparent. There is a single cannulated screw traversing the  posterior malleolar fracture, with persistent 4 mm distraction at the fracture site. No significant callus formation at this location. Lucencies are seen within the distal tibia, talus, and calcaneus from prior hardware. Alignment of the ankle mortise is anatomic. Mild osteoarthritis. Ligaments Suboptimally assessed by CT. Muscles and Tendons Grossly intact. Soft tissues There is diffuse subcutaneous edema, most pronounced overlying the medial malleolus. No evidence of subcutaneous gas. No fluid collections are identified on this limited unenhanced exam. Reconstructed images demonstrate no additional findings. IMPRESSION: 1. ORIF of a trimalleolar fracture as above. Moderate callus formation along the fibular fracture lines, with minimal callus along the medial malleolar fracture lines. There is persistent distraction at the posterior malleolar fracture site, with no significant callus formation. 2. Diffuse soft tissue edema, greatest overlying the medial malleolus. Electronically Signed   By: Sharlet Salina M.D.   On: 03/30/2023 21:14   DG Ankle Complete Right Result Date: 03/28/2023 CLINICAL DATA:  Fracture.  Follow-up. EXAM: RIGHT ANKLE - COMPLETE 3+ VIEW COMPARISON:  Right tibia and fibula radiographs 03/27/2023, right ankle radiographs 01/28/2023 and 01/02/2023 FINDINGS: Redemonstration of distal fibular plate and screw fixation of the previously seen comminuted distal fibular diaphyseal fracture. Unchanged normal alignment. Mild interval increase in fracture line healing sclerosis compared to 01/28/2023. There are 3 associated distal fibular plate screws that fixate the distal tibiofibular syndesmosis. There are two screws fixating the medial malleolus with progressive moderate fracture line healing sclerosis with mild persistent fracture line lucency. Anterior approach distal tibial fixation screw fixates the previously seen posterior malleolar fracture in the coronal plane. This fracture line is not well  visualized. Tiny plantar calcaneal heel spur. Minimal dorsal osteophytes within the midfoot. Moderate atherosclerotic calcifications. IMPRESSION: 1. Unchanged normal alignment of distal fibular plate and  screw fixation of the previously seen comminuted distal fibular diaphyseal fracture. Mild interval increase in fracture line healing sclerosis compared to 01/28/2023. 2. Unchanged normal alignment of medial malleolar screw fixation with progressive moderate fracture line healing sclerosis. 3. Unchanged normal alignment of posterior malleolar screw fixation. Electronically Signed   By: Neita Garnet M.D.   On: 03/28/2023 13:31   DG Tibia/Fibula Right Result Date: 03/27/2023 CLINICAL DATA:  Evaluate for gas, deep wound noted. EXAM: RIGHT TIBIA AND FIBULA - 2 VIEW COMPARISON:  01/28/2023. FINDINGS: There is no evidence of acute fracture or other focal bone lesions. Fixation hardware is noted in the distal tibia and fibula without evidence of hardware loosening. Vascular calcifications are present in the soft tissues. Soft tissue swelling is noted most pronounced of the medial malleolus. No subcutaneous emphysema is seen. IMPRESSION: 1. Soft tissue swelling most pronounced over the lateral malleolus. No subcutaneous emphysema is seen. 2. No acute osseous abnormality. Electronically Signed   By: Thornell Sartorius M.D.   On: 03/27/2023 21:17   DG Chest 2 View Result Date: 03/27/2023 CLINICAL DATA:  Cough and fever for 3 days. EXAM: CHEST - 2 VIEW COMPARISON:  01/02/2023 FINDINGS: The heart size and mediastinal contours are within normal limits. Both lungs are clear. The visualized skeletal structures are unremarkable. IMPRESSION: No active cardiopulmonary disease. Electronically Signed   By: Danae Orleans M.D.   On: 03/27/2023 18:57    Microbiology: Results for orders placed or performed during the hospital encounter of 03/27/23  Resp panel by RT-PCR (RSV, Flu A&B, Covid) Anterior Nasal Swab     Status: Abnormal    Collection Time: 03/27/23  6:33 PM   Specimen: Anterior Nasal Swab  Result Value Ref Range Status   SARS Coronavirus 2 by RT PCR NEGATIVE NEGATIVE Final   Influenza A by PCR POSITIVE (A) NEGATIVE Final   Influenza B by PCR NEGATIVE NEGATIVE Final    Comment: (NOTE) The Xpert Xpress SARS-CoV-2/FLU/RSV plus assay is intended as an aid in the diagnosis of influenza from Nasopharyngeal swab specimens and should not be used as a sole basis for treatment. Nasal washings and aspirates are unacceptable for Xpert Xpress SARS-CoV-2/FLU/RSV testing.  Fact Sheet for Patients: BloggerCourse.com  Fact Sheet for Healthcare Providers: SeriousBroker.it  This test is not yet approved or cleared by the [place] FDA and has been authorized for detection and/or diagnosis of SARS-CoV-2 by FDA under an Emergency Use Authorization (EUA). This EUA will remain in effect (meaning this test can be used) for the duration of the COVID-19 declaration under Section 564(b)(1) of the Act, 21 U.S.C. section 360bbb-3(b)(1), unless the authorization is terminated or revoked.     Resp Syncytial Virus by PCR NEGATIVE NEGATIVE Final    Comment: (NOTE) Fact Sheet for Patients: BloggerCourse.com  Fact Sheet for Healthcare Providers: SeriousBroker.it  This test is not yet approved or cleared by the [place] FDA and has been authorized for detection and/or diagnosis of SARS-CoV-2 by FDA under an Emergency Use Authorization (EUA). This EUA will remain in effect (meaning this test can be used) for the duration of the COVID-19 declaration under Section 564(b)(1) of the Act, 21 U.S.C. section 360bbb-3(b)(1), unless the authorization is terminated or revoked.  Performed at [facility], [address], [place], [place] [address]   Respiratory (~20 pathogens) panel by PCR     Status: Abnormal   Collection  Time: 03/28/23  3:15 PM   Specimen: Nasopharyngeal Swab; Respiratory  Result Value Ref Range  Status   Adenovirus NOT DETECTED NOT DETECTED Final   Coronavirus 229E NOT DETECTED NOT DETECTED Final    Comment: (NOTE) The Coronavirus on the Respiratory Panel, DOES NOT test for the novel  Coronavirus (2019 nCoV)    Coronavirus HKU1 NOT DETECTED NOT DETECTED Final   Coronavirus NL63 NOT DETECTED NOT DETECTED Final   Coronavirus OC43 NOT DETECTED NOT DETECTED Final   Metapneumovirus NOT DETECTED NOT DETECTED Final   Rhinovirus / Enterovirus NOT DETECTED NOT DETECTED Final   Influenza A H3 DETECTED (A) NOT DETECTED Final   Influenza B NOT DETECTED NOT DETECTED Final   Parainfluenza Virus 1 NOT DETECTED NOT DETECTED Final   Parainfluenza Virus 2 NOT DETECTED NOT DETECTED Final   Parainfluenza Virus 3 NOT DETECTED NOT DETECTED Final   Parainfluenza Virus 4 NOT DETECTED NOT DETECTED Final   Respiratory Syncytial Virus NOT DETECTED NOT DETECTED Final   Bordetella pertussis NOT DETECTED NOT DETECTED Final   Bordetella Parapertussis NOT DETECTED NOT DETECTED Final   Chlamydophila pneumoniae NOT DETECTED NOT DETECTED Final   Mycoplasma pneumoniae NOT DETECTED NOT DETECTED Final    Comment: Performed at [facility], [address], [place], [place] [address]  Surgical pcr screen     Status: None   Collection Time: 03/31/23 10:15 AM   Specimen: Nasal Mucosa; Nasal Swab  Result Value Ref Range Status   MRSA, PCR NEGATIVE NEGATIVE Final   Staphylococcus aureus NEGATIVE NEGATIVE Final    Comment: (NOTE) The Xpert SA Assay (FDA approved for NASAL specimens in patients 68 years of age and older), is one component of a comprehensive surveillance program. It is not intended to diagnose infection nor to guide or monitor treatment. Performed at [facility], [address], [place], [place] [address]   Aerobic/Anaerobic Culture w Gram Stain (surgical/deep wound)     Status: None  (Preliminary result)   Collection Time: 03/31/23 11:54 AM   Specimen: Path fluid; Tissue  Result Value Ref Range Status   Specimen Description WOUND RIGHT ANKLE  Final   Special Requests NONE  Final   Gram Stain   Final    RARE WBC PRESENT, PREDOMINANTLY PMN NO ORGANISMS SEEN    Culture   Final    RARE STAPHYLOCOCCUS EPIDERMIDIS RARE ENTEROBACTER CLOACAE NO ANAEROBES ISOLATED STAPHYLOCOCCUS EPIDERMIDIS Sent to [facility] for further susceptibility testing. Performed at [facility], [address], [place], [place] [address]    Report Status PENDING  Incomplete   Organism ID, Bacteria STAPHYLOCOCCUS EPIDERMIDIS  Final   Organism ID, Bacteria ENTEROBACTER CLOACAE  Final      Susceptibility   Enterobacter cloacae - MIC*    CEFEPIME <=0.12 SENSITIVE Sensitive     CEFTAZIDIME <=1 SENSITIVE Sensitive     CIPROFLOXACIN <=0.25 SENSITIVE Sensitive     GENTAMICIN <=1 SENSITIVE Sensitive     IMIPENEM 1 SENSITIVE Sensitive     TRIMETH/SULFA <=20 SENSITIVE Sensitive     PIP/TAZO <=4 SENSITIVE Sensitive ug/mL    * RARE ENTEROBACTER CLOACAE   Staphylococcus epidermidis - MIC*    CIPROFLOXACIN <=0.5 SENSITIVE Sensitive     ERYTHROMYCIN >=8 RESISTANT Resistant     GENTAMICIN <=0.5 SENSITIVE Sensitive     OXACILLIN >=4 RESISTANT Resistant     TETRACYCLINE >=16 RESISTANT Resistant     VANCOMYCIN 2 SENSITIVE Sensitive     TRIMETH/SULFA 160 RESISTANT Resistant     CLINDAMYCIN RESISTANT Resistant     RIFAMPIN <=0.5 SENSITIVE Sensitive  Inducible Clindamycin POSITIVE Resistant     LINEZOLID 2 SENSITIVE Sensitive     * RARE STAPHYLOCOCCUS EPIDERMIDIS  Aerobic/Anaerobic Culture w Gram Stain (surgical/deep wound)     Status: None   Collection Time: 04/02/23  2:27 PM   Specimen: Wound; Tissue  Result Value Ref Range Status   Specimen Description WOUND RIGHT ANKLE  Final   Special Requests NONE  Final   Gram Stain NO WBC SEEN NO ORGANISMS SEEN   Final   Culture   Final    No growth  aerobically or anaerobically. Performed at [facility], [address], [place], [place] [address]    Report Status 04/07/2023 FINAL  Final  MIC (1 Drug)-R-ankle wound; 03/31/2023; Wound; MRSE; Daptomycin     Status: None   Collection Time: 04/04/23  4:30 PM   Specimen: Wound  Result Value Ref Range Status   Min Inhibitory Conc (1 Drug) Final report  Corrected    Comment: (NOTE) Performed At: [facility] [address] [place], [place] [id number] Jolene Schimke MD AO:[id number] CORRECTED ON 02/19 AT 0936: PREVIOUSLY REPORTED AS Preliminary report    Source ANKLE  Final    Comment: Performed at [facility], [address], [place], [place] [address]   *Note: Due to a large number of results and/or encounters for the requested time period, some results have not been displayed. A complete set of results can be found in Results Review.    Labs: CBC: Recent Labs  Lab 04/07/23 0411 04/09/23 0209 04/10/23 0750  WBC 8.2 7.8 8.3  NEUTROABS 5.4  --   --   HGB 10.7* 9.7* 9.8*  HCT 31.5* 28.2* 28.1*  MCV 84.7 86.2 84.6  PLT 228 209 207   Basic Metabolic Panel: Recent Labs  Lab 04/05/23 0427 04/06/23 0810 04/07/23 0411 04/09/23 0209 04/10/23 0429 04/10/23 0750  NA 137 135 135 133* 136  --   K 3.3* 3.2* 3.7 3.2* 3.1*  --   CL 107 101 101 98 101  --   CO2 20* 21* 23 25 24   --   GLUCOSE 161* 158* 198* 255* 143*  --   BUN 22 24* 29* 39* 35*  --   CREATININE 2.47* 2.30* 2.59* 3.16* 2.63*  --   CALCIUM 8.9 8.8* 9.1 8.6* 8.9  --   MG  --  1.6* 2.0  --   --  1.9   Liver Function Tests: Recent Labs  Lab 04/10/23 0429  AST 21  ALT 21  ALKPHOS 73  BILITOT 0.7  PROT 5.8*  ALBUMIN 2.3*   CBG: Recent Labs  Lab 04/09/23 1825 04/09/23 2315 04/10/23 0557 04/10/23 0812 04/10/23 1220  GLUCAP 267* 148* 136* 204* 185*    Discharge time spent: less than 30 minutes.  Signed: Rickey Barbara, MD [facility] 04/10/2023

## 2023-04-10 NOTE — Progress Notes (Signed)
PT Cancellation Note  Patient Details Name: [name] MRN: [id number] DOB: 11/03/1955   Cancelled Treatment:    Reason Eval/Treat Not Completed: Patient declined, no reason specified (Pt polietly refused PT treatment as this time since his lunch tray had just arrived and he wanted to eat. He also reported he should be d/c Home later today, so he would prefer to rest.) Will continue acute PT efforts as schedule permits.   Cheri Guppy, PT, DPT Acute Rehabilitation Services Office: [phone] Secure Chat Preferred  Richardson Chiquito 04/10/2023, 12:18 PM

## 2023-04-10 NOTE — Progress Notes (Signed)
   04/10/23 2353  BiPAP/CPAP/SIPAP  Reason BIPAP/CPAP not in use Non-compliant (Patient states he does not need CPAP tonight.)  BiPAP/CPAP /SiPAP Vitals  Pulse Rate 62  Resp 18  SpO2 97 %  Bilateral Breath Sounds Clear  MEWS Score/Color  MEWS Score 0  MEWS Score Color Harold [name]

## 2023-04-11 ENCOUNTER — Other Ambulatory Visit (HOSPITAL_COMMUNITY): Payer: Self-pay

## 2023-04-11 DIAGNOSIS — L02415 Cutaneous abscess of right lower limb: Secondary | ICD-10-CM | POA: Diagnosis not present

## 2023-04-11 DIAGNOSIS — L97314 Non-pressure chronic ulcer of right ankle with necrosis of bone: Secondary | ICD-10-CM | POA: Diagnosis not present

## 2023-04-11 DIAGNOSIS — J101 Influenza due to other identified influenza virus with other respiratory manifestations: Secondary | ICD-10-CM | POA: Diagnosis not present

## 2023-04-11 DIAGNOSIS — N179 Acute kidney failure, unspecified: Secondary | ICD-10-CM | POA: Diagnosis not present

## 2023-04-11 LAB — GLUCOSE, CAPILLARY
Glucose-Capillary: 147 mg/dL — ABNORMAL HIGH (ref 70–99)
Glucose-Capillary: 176 mg/dL — ABNORMAL HIGH (ref 70–99)
Glucose-Capillary: 178 mg/dL — ABNORMAL HIGH (ref 70–99)
Glucose-Capillary: 227 mg/dL — ABNORMAL HIGH (ref 70–99)
Glucose-Capillary: 311 mg/dL — ABNORMAL HIGH (ref 70–99)

## 2023-04-11 LAB — CBC
HCT: 29.5 % — ABNORMAL LOW (ref 39.0–52.0)
Hemoglobin: 10.1 g/dL — ABNORMAL LOW (ref 13.0–17.0)
MCH: 29.1 pg (ref 26.0–34.0)
MCHC: 34.2 g/dL (ref 30.0–36.0)
MCV: 85 fL (ref 80.0–100.0)
Platelets: 203 10*3/uL (ref 150–400)
RBC: 3.47 MIL/uL — ABNORMAL LOW (ref 4.22–5.81)
RDW: 14.7 % (ref 11.5–15.5)
WBC: 7.7 10*3/uL (ref 4.0–10.5)
nRBC: 0 % (ref 0.0–0.2)

## 2023-04-11 LAB — COMPREHENSIVE METABOLIC PANEL
ALT: 29 U/L (ref 0–44)
AST: 35 U/L (ref 15–41)
Albumin: 2.4 g/dL — ABNORMAL LOW (ref 3.5–5.0)
Alkaline Phosphatase: 75 U/L (ref 38–126)
Anion gap: 9 (ref 5–15)
BUN: 32 mg/dL — ABNORMAL HIGH (ref 8–23)
CO2: 24 mmol/L (ref 22–32)
Calcium: 9 mg/dL (ref 8.9–10.3)
Chloride: 101 mmol/L (ref 98–111)
Creatinine, Ser: 2.62 mg/dL — ABNORMAL HIGH (ref 0.61–1.24)
GFR, Estimated: 26 mL/min — ABNORMAL LOW (ref >60)
Glucose, Bld: 182 mg/dL — ABNORMAL HIGH (ref 70–99)
Potassium: 3.4 mmol/L — ABNORMAL LOW (ref 3.5–5.1)
Sodium: 134 mmol/L — ABNORMAL LOW (ref 135–145)
Total Bilirubin: 0.6 mg/dL (ref 0.0–1.2)
Total Protein: 6 g/dL — ABNORMAL LOW (ref 6.5–8.1)

## 2023-04-11 LAB — HEMOGLOBIN AND HEMATOCRIT, BLOOD
HCT: 31.1 % — ABNORMAL LOW (ref 39.0–52.0)
Hemoglobin: 10.7 g/dL — ABNORMAL LOW (ref 13.0–17.0)

## 2023-04-11 LAB — CK: Total CK: 80 U/L (ref 49–397)

## 2023-04-11 MED ORDER — POTASSIUM CHLORIDE CRYS ER 20 MEQ PO TBCR
60.0000 meq | EXTENDED_RELEASE_TABLET | Freq: Once | ORAL | Status: AC
  Administered 2023-04-11: 60 meq via ORAL
  Filled 2023-04-11: qty 3

## 2023-04-11 NOTE — Progress Notes (Signed)
Physical Therapy Treatment Patient Details Name: Harold [name] MRN: [id number] DOB: 11/18/1955 Today's Date: 04/11/2023   History of Present Illness 68 y.o. male [facility] to [facility] 03/27/23 with productive cough and generalized weakness. Admitted with influenza A and cellulitis of R foot s/p hardware removal of the R ankle and application of wound vac on 2/10. Prior admit 11/24 for R ankle ORIF and in 12/24 for revision. S/p excisional debridement of right ankle on 2/12. Pt underwent central line placement on 04/07/2023 however he has been having issues with recurrent bleeding from the insertion site which has finally stopped. Significant PMH: HTN, HLD, CAD s/p PCI, COPD, DM2, CKD stage IIIb, PVD, gout, OSA, neuropathy, COPD, a-fib, CAD s/p percutaneous coronary angioplasty, angina, obesity    PT Comments  Pt is highly motivated to engaged in PT and advance his functional mobility. Today's session focused on gait and wheelchair training. Pt is dependent for RLE splint and CAM boot don/doffing, reporting reduced flexibility from prior back surgeries. He requires +2 assistance throughout session for safety secondary to unsteadiness, dizziness, and fatigue. Pt engaged in STS and bed>chair transfers with minA. He ambulated a maximum of 42ft using RW with minA and appeared to place at least 25% of his bodyweight on RLE. Introduce a manual w/c given pt's difficulty with maintaining RLE TDWB, unsteadiness in standing, and quick fatigue. He propelled the w/c ~15ft with short small pushes but requires assistance with managing leg rests. Pt will continue to benefit from skilled acute PT services to maximize his functional mobility and safety prior to d/c. If pt were to d/c Home he would require 24/7 supervision and assist for all functional mobility d/t his high fall risk and would benefit from HHPT.    If plan is discharge home, recommend the following: A lot of help with walking and/or transfers;A lot of help with  bathing/dressing/bathroom;Assistance with cooking/housework;Assist for transportation;Help with stairs or ramp for entrance   Can travel by private vehicle     Yes  Equipment Recommendations  BSC/3in1 (Pt has a tub bench, w/c, RW, and sliding board already)    Recommendations for Other Services       Precautions / Restrictions Precautions Precautions: Fall Recall of Precautions/Restrictions: Impaired Precaution/Restrictions Comments: Watch BP; wound vac RLE Required Braces or Orthoses: Other Brace Other Brace: R CAM boot for ambulation, R splint in bed Restrictions Weight Bearing Restrictions Per Provider Order: Yes RLE Weight Bearing Per Provider Order: Touchdown weight bearing     Mobility  Bed Mobility Overal bed mobility: Needs Assistance Bed Mobility: Supine to Sit     Supine to sit: HOB elevated, Used rails, Supervision     General bed mobility comments: Pt reports he is unable to doff splint or don boot d/t reduced trunk mobility from prior back surgeries. He required increased time to reach EOB with HOB raised and use of bedrails.    Transfers Overall transfer level: Needs assistance Equipment used: Rolling walker (2 wheels) Transfers: Sit to/from Stand, Bed to chair/wheelchair/BSC Sit to Stand: Min assist, +2 safety/equipment   Step pivot transfers: Min assist, +2 safety/equipment       General transfer comment: STS from EOB and manual w/c x5 reps. Cued pt on proper hand positioning, to scoot to the edge, and increas his fwd flex. Pt initially unsteady when coming to upright, requires extra time to gain stability. Good eccentric control with sitting.    Ambulation/Gait Ambulation/Gait assistance: Contact guard assist, Min assist, +2 safety/equipment (Chair Follow) Gait Distance (  Feet): 15 Feet (1x5, seated rest, 1x15, seated rest, 1x10 seated rest, 1x5, seated rest) Assistive device: Rolling walker (2 wheels) Gait Pattern/deviations: Step-to pattern,  Decreased stride length, Drifts right/left, Trunk flexed, Decreased weight shift to right, Decreased stance time - right Gait velocity: reduced Gait velocity interpretation: <1.31 ft/sec, indicative of household ambulator   General Gait Details: Educated pt on sequencing of RW to advance LLE while maintaining RLE TDWB. Pt appears to place at least 25% of his body weight on RLE when advancing, reminded him of WBing restriction, and he reports "I am trying, but it is hard."  Pt took short steps with minimal floor clearence. He frequently became unsteady d/t dizziness and fatigue requiring minA to stabilize and an immediate seated rest. Pt keeps RW close to him, but has difficulty navigating around obstacles within his room given the amount of WBing applied through BUE on RW.   Stairs             Merchant navy officer mobility: Yes (Introduced given pt's limited ability to abide by RLE ONEOK precaution.) Wheelchair propulsion: Both upper extremities, Left lower extremity Wheelchair parts: Needs assistance Distance: 30 (1x5, rest, 1x30) Wheelchair Assistance Details (indicate cue type and reason): Educated pt on the various parts/features of a manual w/c. Instructed him on how to propel the w/c. Pt initially attempting to push with BUE and pull with BLE. Discussed how using the LLE is an option, but to maintain RLE NWB. Pt demonstrated short slow pushes, VC to reach back further and proper hand positioning. He fatigues quickly. Requires SBA to lock/unlock brakes and modA to remove leg rests.   Tilt Bed    Modified Rankin (Stroke Patients Only)       Balance Overall balance assessment: Needs assistance Sitting-balance support: No upper extremity supported, Feet supported Sitting balance-Leahy Scale: Poor Sitting balance - Comments: Pt sat EOB with supervision but is unable to reach outside his BOS. Postural control: Posterior lean, Left lateral  lean Standing balance support: Bilateral upper extremity supported, During functional activity, Reliant on assistive device for balance Standing balance-Leahy Scale: Poor Standing balance comment: Pt is unsteady when upright with heavy reliance on RW. He demonstrated sway during gait, but as he fatigues will lean posterior and L laterally.                            Communication Communication Communication: No apparent difficulties  Cognition Arousal: Alert Behavior During Therapy: WFL for tasks assessed/performed   PT - Cognitive impairments: No apparent impairments                         Following commands: Intact      Cueing Cueing Techniques: Verbal cues, Tactile cues  Exercises      General Comments General comments (skin integrity, edema, etc.): Pt is bleeding from his port, RN and MD notified and aware. Sitting EOB: BP 112/56 (72), HR 60. Standing: BP 95/67 (76). During Activity: BP 92/79 (86). End of Session: BP 133/66 (85). Pt had a couple of reports of dizziness during the session that was relieved with PLB and rest breaks.      Pertinent Vitals/Pain Pain Assessment Pain Assessment: No/denies pain    Home Living                          Prior Function  PT Goals (current goals can now be found in the care plan section) Acute Rehab PT Goals Patient Stated Goal: Return home confidently Progress towards PT goals: Progressing toward goals    Frequency    Min 1X/week      PT Plan      Co-evaluation              AM-PAC PT "6 Clicks" Mobility   Outcome Measure  Help needed turning from your back to your side while in a flat bed without using bedrails?: A Lot Help needed moving from lying on your back to sitting on the side of a flat bed without using bedrails?: A Lot Help needed moving to and from a bed to a chair (including a wheelchair)?: Total Help needed standing up from a chair using your arms (e.g.,  wheelchair or bedside chair)?: Total Help needed to walk in hospital room?: Total Help needed climbing 3-5 steps with a railing? : Total 6 Click Score: 8    End of Session Equipment Utilized During Treatment: Gait belt Activity Tolerance: Patient limited by fatigue Patient left: in chair;with call bell/phone within reach;with family/visitor present;with chair alarm set Nurse Communication: Mobility status PT Visit Diagnosis: Other abnormalities of gait and mobility (R26.89);Unsteadiness on feet (R26.81);Muscle weakness (generalized) (M62.81);History of falling (Z91.81);Difficulty in walking, not elsewhere classified (R26.2)     Time: 1131-1220 PT Time Calculation (min) (ACUTE ONLY): 49 min  Charges:    $Gait Training: 23-37 mins $Therapeutic Activity: 8-22 mins PT General Charges $$ ACUTE PT VISIT: 1 Visit                     Cheri Guppy, PT, DPT Acute Rehabilitation Services Office: [phone] Secure Chat Preferred  Richardson Chiquito 04/11/2023, 4:18 PM

## 2023-04-11 NOTE — Progress Notes (Signed)
Progress Note   Patient: Harold [name] JWJ:[id number] DOB: [birth year] DOA: 03/27/2023     15 DOS: the patient was seen and examined on 04/11/2023   Brief hospital course: 68 y.o. male with PMH significant for morbid obesity, OSA, DM2, HTN, HLD, CAD/stent, A-fib on Eliquis, carotid artery stenosis, CKD, chronic anemia, anxiety/depression, diabetic neuropathy, GERD, hypothyroidism, COPD, pulmonary hypertension. 2/6, patient presented to the ED with complaint of cough, subjective fever, myalgia, generalized weakness for 5 days. Respiratory virus panel positive for influenza A. Chest x-ray negative for infiltrates. Out of for Tamiflu.  He improved from fluid with supportive care.   On admission, he was also noted to have right leg cellulitis. Per chart review, in November 2024, he underwent ORIF of the right ankle, with ensuing revision in December 2024.  He has subsequently been followed by wound care at home.  However over the course the last 1 week, patient had progressive right lateral malleolus erythema, swelling, tenderness, with some evidence of purulent drainage, X-ray showed soft tissue swelling most pronounced over the lateral malleolus, consistent with cellulitis, without evidence of subcutaneous gas or crepitus to suggest necrotizing fasciitis.  Orthopedics and vascular surgery consult appreciated.  Assessment and Plan: Right leg wound infection -Prior right ankle surgery (12/2022) complicated by postsurgical wound infection. S/p right ankle hardware removal, I&D and wound VAC placement -2/10 Dr. Jena Gauss In the setting of uncontrolled diabetes, and possible PAD.  S/p repeat debridement of the right ankle by Dr. Lajoyce Corners 04/02/2023.  Seen by ID [id number], antibiotics transitioned to cefepime and daptomycin.   Wound culture grew polymicrobial organisms.  ID consulted and recommended continuing current cefepime and daptomycin for 4 weeks since surgery EOT 05/12/2023 with weekly CK as outpatient..  Central line placed on 04/07/2023 with some post-procedural bleed noted.   Seen by PT OT, they recommended SNF, however patient will have to pay out-of-pocket for the co-pay which patient is unable to afford, ultimate decision to d/c home at time of d/c -Home setting was later found to be not safe. Per TOC, plan to enroll pt in [facility] program here   PAD Seen by vascular surgery.2/11, underwent aortogram, right leg angiogram, successful stenting of right superficial femoral artery.  Patient on Plavix and Eliquis which was resumed on 04/03/2023 after cleared by orthopedics.   Acute diarrhea Resolved   Influenza A: Presented with 5 days of cough, fever, myalgia. Respiratory virus panel positive for influenza A. Chest x-ray without acute infiltrate. No fever, no WBC count Was outside the 48-hour window for Tamiflu and hence was not initiated.  Doing well on supportive care.   Type 2 diabetes mellitus / Diabetic neuropathy, POA: A1c 7 on 01/02/2023. PTA meds-Tresiba 60 units daily, Mounjaro Currently on Lantus 35 units daily and SSI/Accu-Cheks.  Blood sugar slightly elevated again.  Will increase Semglee to 40 units today.  PTA reportedly on gabapentin 1600 mg 3 times daily. Since dose was unable to be confirmed, pt was continued on 400 mg 3 times daily instead   Hypokalemia/hypomagnesemia: Improved   Paroxysmal A-fib Rates controlled on PTA meds Coreg, amiodarone  -Eliquis held secondary to bleeding from CVL today   Essential hypertension PTA meds- Coreg 3.125 mg twice daily, Lasix 160 mg daily, currently on home dose of Coreg with amlodipine 5 mg added -Lasix was increased from 40mg  bid to 80mg  bid on 2/14, however with  resultant increase of Cr up to 3.16. Lasix subsequently held -Cr has since improved with gentle oral hydration  Anemia of chronic disease: Baseline hemoglobin between 10.5 11.5.   -Hgb remains stable. Bleeding noted at CVL site, eliquis held for now   CAD/stent Carotid  artery stenosis HLD Continue PTA meds -Plavix, Crestor -Eliquis held for now   Hypothyroidism Continue Synthroid   Morbid Obesity  Body mass index is 39.99 kg/m. Patient has been advised to make an attempt to improve diet and exercise patterns to aid in weight loss.   Obstructive sleep apnea Patient reported good compliance on home nocturnal CPAP   Gout Allopurinol, colchicine   Depression Cymbalta   AKI on CKD stage IIIb: Baseline creatinine appears to be 1.9-2.2.  Creatinine peaked to 3.14 that started to trend up after increasing lasix dose to 80mg  -Lasix now held -Allow pt to self-hydrate given propensity for developing edema in the setting of CKD and heart disease -Cr has improved with oral hydration -cont to follow bmet trends   Subjective: Seen with pt working with PT. Bleeding from CVL noted  Physical Exam: Vitals:   04/10/23 2353 04/11/23 0329 04/11/23 0803 04/11/23 1452  BP:  (!) 146/67 (!) 157/68 (!) 134/94  Pulse: 62 60 (!) 58 64  Resp: 18 20 16 19   Temp:  97.9 F (36.6 C) 98.1 F (36.7 C) 97.7 F (36.5 C)  TempSrc:  Axillary Axillary Oral  SpO2: 97% 98%    Weight:  129.6 kg    Height:       General exam: Awake, in nad Respiratory system: Normal respiratory effort, no wheezing Cardiovascular system: perfused, no notable JVD noted Gastrointestinal system: Soft, nondistended, positive BS Central nervous system: CN2-12 grossly intact, strength intact Extremities: Perfused, no clubbing Skin: Normal skin turgor, no notable skin lesions seen Psychiatry: Mood normal // no visual hallucinations   Data Reviewed:  Labs reviewed: Na 134, K 3.4, Cr 2.62, Hgb 10.7  Family Communication: Pt in room, family not at bedside  Disposition: Status is: Inpatient Remains inpatient appropriate because: Severity of illness  Planned Discharge Destination: Home    Author: Rickey Barbara, MD 04/11/2023 4:33 PM  For on call review [email or web address].

## 2023-04-11 NOTE — TOC Progression Note (Signed)
Transition of Care [facility]) - Progression Note    Patient Details  Name: Harold [name] MRN: [id number] Date of Birth: [birth year]  Transition of Care [facility]) CM/SW Contact  Graves-Bigelow, Lamar Laundry, RN Phone Number: 04/11/2023, 2:36 PM  Clinical Narrative:  Case Manager spoke with the patient and spouse regarding disposition plan. Plan will be to return home once patient at his baseline with ambulation. Floors in the bedroom, bathroom and the den have been renovated and these are the only places that the patient will be in. [facility] is willing to follow the patient in the home since they are familiar with the situation. Ideally the patient would need to be ambulating 30 ft to get to these areas in the home prior to safely discharge home. Hopefully patient can be functionally stable to discharge by Tuesday or Wednesday of next week if PT is able to work with the patient more. Information sent to see rehab staff to if patient is a candidate for the [facility]. Staff to reevaluate on Monday to see if he is a possible candidate. Staff is aware to mobilize the patient as much as possible. Patient meets inpatient criteria due to IV antibiotic therapy and holding anticoagulant at this time. Case Manager will continue to follow for additional needs as the patient progresses.    Expected Discharge Plan: Home w Home Health Services Barriers to Discharge: No Barriers Identified  Expected Discharge Plan and Services In-house Referral: Clinical Social Work Discharge Planning Services: CM Consult Post Acute Care Choice: Home Health Living arrangements for the past 2 months: Single Family Home Expected Discharge Date: 04/10/23               DME Arranged: IV pump/equipment DME Agency:  [facility])       HH Arranged: RN, Disease Management, PT, OT, Nurse's Aide HH Agency: [facility]         Social Determinants of Health (SDOH) Interventions SDOH Screenings    Food Insecurity: No Food Insecurity (03/27/2023)  Housing: Low Risk  (03/28/2023)  Transportation Needs: No Transportation Needs (03/27/2023)  Utilities: Not At Risk (03/28/2023)  Social Connections: Unknown (03/28/2023)  Tobacco Use: Medium Risk (04/02/2023)    Readmission Risk Interventions     No data to display

## 2023-04-12 DIAGNOSIS — N179 Acute kidney failure, unspecified: Secondary | ICD-10-CM | POA: Diagnosis not present

## 2023-04-12 DIAGNOSIS — J101 Influenza due to other identified influenza virus with other respiratory manifestations: Secondary | ICD-10-CM | POA: Diagnosis not present

## 2023-04-12 DIAGNOSIS — L02415 Cutaneous abscess of right lower limb: Secondary | ICD-10-CM | POA: Diagnosis not present

## 2023-04-12 DIAGNOSIS — L97314 Non-pressure chronic ulcer of right ankle with necrosis of bone: Secondary | ICD-10-CM | POA: Diagnosis not present

## 2023-04-12 LAB — GLUCOSE, CAPILLARY
Glucose-Capillary: 149 mg/dL — ABNORMAL HIGH (ref 70–99)
Glucose-Capillary: 164 mg/dL — ABNORMAL HIGH (ref 70–99)
Glucose-Capillary: 173 mg/dL — ABNORMAL HIGH (ref 70–99)
Glucose-Capillary: 174 mg/dL — ABNORMAL HIGH (ref 70–99)
Glucose-Capillary: 197 mg/dL — ABNORMAL HIGH (ref 70–99)

## 2023-04-12 LAB — COMPREHENSIVE METABOLIC PANEL
ALT: 37 U/L (ref 0–44)
AST: 46 U/L — ABNORMAL HIGH (ref 15–41)
Albumin: 2.2 g/dL — ABNORMAL LOW (ref 3.5–5.0)
Alkaline Phosphatase: 88 U/L (ref 38–126)
Anion gap: 13 (ref 5–15)
BUN: 33 mg/dL — ABNORMAL HIGH (ref 8–23)
CO2: 24 mmol/L (ref 22–32)
Calcium: 9.5 mg/dL (ref 8.9–10.3)
Chloride: 101 mmol/L (ref 98–111)
Creatinine, Ser: 2.62 mg/dL — ABNORMAL HIGH (ref 0.61–1.24)
GFR, Estimated: 26 mL/min — ABNORMAL LOW (ref >60)
Glucose, Bld: 168 mg/dL — ABNORMAL HIGH (ref 70–99)
Potassium: 3.9 mmol/L (ref 3.5–5.1)
Sodium: 138 mmol/L (ref 135–145)
Total Bilirubin: 0.5 mg/dL (ref 0.0–1.2)
Total Protein: 5.5 g/dL — ABNORMAL LOW (ref 6.5–8.1)

## 2023-04-12 LAB — CBC
HCT: 28.4 % — ABNORMAL LOW (ref 39.0–52.0)
Hemoglobin: 9.4 g/dL — ABNORMAL LOW (ref 13.0–17.0)
MCH: 28.8 pg (ref 26.0–34.0)
MCHC: 33.1 g/dL (ref 30.0–36.0)
MCV: 87.1 fL (ref 80.0–100.0)
Platelets: 198 10*3/uL (ref 150–400)
RBC: 3.26 MIL/uL — ABNORMAL LOW (ref 4.22–5.81)
RDW: 14.7 % (ref 11.5–15.5)
WBC: 6.1 10*3/uL (ref 4.0–10.5)
nRBC: 0 % (ref 0.0–0.2)

## 2023-04-12 LAB — HEPARIN LEVEL (UNFRACTIONATED): Heparin Unfractionated: >1.1 [IU]/mL — ABNORMAL HIGH (ref 0.30–0.70)

## 2023-04-12 LAB — APTT: aPTT: 60 s — ABNORMAL HIGH (ref 24–36)

## 2023-04-12 MED ORDER — HEPARIN (PORCINE) 25000 UT/250ML-% IV SOLN
1650.0000 [IU]/h | INTRAVENOUS | Status: DC
  Administered 2023-04-12: 1600 [IU]/h via INTRAVENOUS
  Administered 2023-04-13: 1650 [IU]/h via INTRAVENOUS
  Administered 2023-04-13: 1700 [IU]/h via INTRAVENOUS
  Administered 2023-04-14 – 2023-04-17 (×5): 1650 [IU]/h via INTRAVENOUS
  Filled 2023-04-12 (×8): qty 250

## 2023-04-12 NOTE — Progress Notes (Addendum)
 Physical Therapy Treatment Patient Details Name: Harold [name] MRN: [id number] DOB: 1955-05-07 Today's Date: 04/12/2023   History of Present Illness 68 y.o. male [facility] to [facility] 03/27/23 with productive cough and generalized weakness. Admitted with influenza A and cellulitis of R foot s/p hardware removal of the R ankle and application of wound vac on 2/10. Prior admit 11/24 for R ankle ORIF and in 12/24 for revision. S/p excisional debridement of right ankle on 2/12. Pt underwent central line placement on 04/07/2023 however he has been having issues with recurrent bleeding from the insertion site which has finally stopped. Significant PMH: HTN, HLD, CAD s/p PCI, COPD, DM2, CKD stage IIIb, PVD, gout, OSA, neuropathy, COPD, a-fib, CAD s/p percutaneous coronary angioplasty, angina, obesity    PT Comments  Patient resting in bed on arrival and eager to mobilize with therapy. Pt completed bed mobility at supervision level with use of bed features and extra time. Pt dependent for doffing of Rt LE splint and donning of CAM boot. EOB elevated slightly to improve power up to stand to RW, light Min assist for safety with rise form higher surface. Min assist provided initially for pivot bed>chair and pt with poor control for lowering to sit. Greater but min assist still for sit>stand from low recliner to RW. Pt denied dizziness throughout gait and amb 3 bouts of ~30', 20', 30' with Min assist intermittently and moments of CGA. Pt demonstrated good carryover for step sequencing with RW and decreased postural sway/lean noted today. EOS pt agreeable to remain OOB in recliner. Pt will continue to benefit from skilled acute PT services to maximize his functional mobility and safety prior to d/c. If pt were to d/c Home he would benefit form HHPT and require 24/7 supervision and assist for all functional mobility d/t his high fall risk. Will continue to progress as able.    If plan is discharge home, recommend the  following: A lot of help with walking and/or transfers;A lot of help with bathing/dressing/bathroom;Assistance with cooking/housework;Assist for transportation;Help with stairs or ramp for entrance   Can travel by private vehicle     Yes  Equipment Recommendations  BSC/3in1 (Pt has a tub bench, w/c, RW, and sliding board already)    Recommendations for Other Services       Precautions / Restrictions Precautions Precautions: Fall Recall of Precautions/Restrictions: Impaired Precaution/Restrictions Comments: Watch BP; wound vac RLE Required Braces or Orthoses: Other Brace Other Brace: R CAM boot for ambulation, R splint in bed Restrictions Weight Bearing Restrictions Per Provider Order: Yes RLE Weight Bearing Per Provider Order: Touchdown weight bearing     Mobility  Bed Mobility Overal bed mobility: Needs Assistance Bed Mobility: Supine to Sit     Supine to sit: HOB elevated, Used rails, Supervision     General bed mobility comments: HOB slightly elevated, pt using bed rail, sup for safety supine>sit    Transfers Overall transfer level: Needs assistance Equipment used: Rolling walker (2 wheels) Transfers: Sit to/from Stand, Bed to chair/wheelchair/BSC Sit to Stand: Min assist   Step pivot transfers: Min assist       General transfer comment: EOB elevated to improve power up, min assist to steady with rise and to guide walker for bed>chair transfer. poor eccentric control with sit in low chair, cues for reach back and slow lowering. Pt improved throughout session with each transfer.    Ambulation/Gait Ambulation/Gait assistance: Contact guard assist, Min assist, +2 safety/equipment (Chair Follow) Gait Distance (Feet): 35 Feet [phone]) Assistive  device: Rolling walker (2 wheels) Gait Pattern/deviations: Step-to pattern, Decreased stride length, Drifts right/left, Trunk flexed, Decreased weight shift to right, Decreased stance time - right Gait velocity: reduced      General Gait Details: pt demonstrated good carryover for sequencing of RW to advance LLE while maintaining RLE TDWB. pt reports difficult to maintain TDWB on Rt LE but trying. Pt denied dizziness with gait., wide BOS, no buckling or LOB noted throughout.   Stairs             Wheelchair Mobility     Tilt Bed    Modified Rankin (Stroke Patients Only)       Balance Overall balance assessment: Needs assistance Sitting-balance support: No upper extremity supported, Feet supported Sitting balance-Leahy Scale: Fair Sitting balance - Comments: statically Postural control: Posterior lean Standing balance support: Bilateral upper extremity supported, During functional activity, Reliant on assistive device for balance Standing balance-Leahy Scale: Poor Standing balance comment: Pt is unsteady when upright with heavy reliance on RW. decreased postural sway noted this session.                            Communication Communication Communication: No apparent difficulties  Cognition Arousal: Alert Behavior During Therapy: WFL for tasks assessed/performed   PT - Cognitive impairments: No apparent impairments                         Following commands: Intact      Cueing Cueing Techniques: Verbal cues, Tactile cues  Exercises      General Comments        Pertinent Vitals/Pain Pain Assessment Pain Assessment: Faces Faces Pain Scale: Hurts a little bit Pain Location: Back Pain Intervention(s): Limited activity within patient's tolerance, Monitored during session, Repositioned    Home Living                          Prior Function            PT Goals (current goals can now be found in the care plan section) Acute Rehab PT Goals Patient Stated Goal: Return home confidently PT Goal Formulation: With patient/family Time For Goal Achievement: 04/17/23 Potential to Achieve Goals: Good Progress towards PT goals: Progressing toward  goals    Frequency    Min 1X/week      PT Plan      Co-evaluation              AM-PAC PT "6 Clicks" Mobility   Outcome Measure  Help needed turning from your back to your side while in a flat bed without using bedrails?: A Little Help needed moving from lying on your back to sitting on the side of a flat bed without using bedrails?: A Little Help needed moving to and from a bed to a chair (including a wheelchair)?: A Little Help needed standing up from a chair using your arms (e.g., wheelchair or bedside chair)?: A Little Help needed to walk in hospital room?: Total Help needed climbing 3-5 steps with a railing? : Total 6 Click Score: 14    End of Session Equipment Utilized During Treatment: Gait belt Activity Tolerance: Patient limited by fatigue Patient left: in chair;with call bell/phone within reach;with family/visitor present;with chair alarm set Nurse Communication: Mobility status PT Visit Diagnosis: Other abnormalities of gait and mobility (R26.89);Unsteadiness on feet (R26.81);Muscle weakness (generalized) (M62.81);History of falling (Z91.81);Difficulty in  walking, not elsewhere classified (R26.2)     Time: 4098-1191 PT Time Calculation (min) (ACUTE ONLY): 36 min  Charges:    $Gait Training: 8-22 mins $Therapeutic Activity: 8-22 mins PT General Charges $$ ACUTE PT VISIT: 1 Visit                     Wynn Maudlin, DPT Acute Rehabilitation Services Office [phone]  04/12/23 5:04 PM

## 2023-04-12 NOTE — Progress Notes (Signed)
 Progress Note   Patient: Harold [name] WUJ:[id number] DOB: January 26, 1956 DOA: 03/27/2023     16 DOS: the patient was seen and examined on 04/12/2023   Brief hospital course: 68 y.o. male with PMH significant for morbid obesity, OSA, DM2, HTN, HLD, CAD/stent, A-fib on Eliquis, carotid artery stenosis, CKD, chronic anemia, anxiety/depression, diabetic neuropathy, GERD, hypothyroidism, COPD, pulmonary hypertension. 2/6, patient presented to the ED with complaint of cough, subjective fever, myalgia, generalized weakness for 5 days. Respiratory virus panel positive for influenza A. Chest x-ray negative for infiltrates. Out of for Tamiflu.  He improved from fluid with supportive care.   On admission, he was also noted to have right leg cellulitis. Per chart review, in November 2024, he underwent ORIF of the right ankle, with ensuing revision in December 2024.  He has subsequently been followed by wound care at home.  However over the course the last 1 week, patient had progressive right lateral malleolus erythema, swelling, tenderness, with some evidence of purulent drainage, X-ray showed soft tissue swelling most pronounced over the lateral malleolus, consistent with cellulitis, without evidence of subcutaneous gas or crepitus to suggest necrotizing fasciitis.  Orthopedics and vascular surgery consult appreciated.  Assessment and Plan: Right leg wound infection -Prior right ankle surgery (12/2022) complicated by postsurgical wound infection. S/p right ankle hardware removal, I&D and wound VAC placement -2/10 Dr. Jena Gauss In the setting of uncontrolled diabetes, and possible PAD.  S/p repeat debridement of the right ankle by Dr. Lajoyce Corners 04/02/2023.  Seen by ID [id number], antibiotics transitioned to cefepime and daptomycin.   Wound culture grew polymicrobial organisms.  ID consulted and recommended continuing current cefepime and daptomycin for 4 weeks since surgery EOT 05/12/2023 with weekly CK as outpatient..  Central line placed on 04/07/2023 with some post-procedural bleed noted.   Seen by PT OT, they recommended SNF, however patient will have to pay out-of-pocket for the co-pay which patient is unable to afford, ultimate decision to d/c home at time of d/c -Home setting was later found to be not safe. Per TOC, plan to enroll pt in [facility] program here   PAD Seen by vascular surgery.2/11, underwent aortogram, right leg angiogram, successful stenting of right superficial femoral artery.  Patient on Plavix and Eliquis which was resumed on 04/03/2023 after cleared by orthopedics. -With concerns of bleeding at central line site, pt is now on heparin gtt for now   Acute diarrhea Resolved   Influenza A: Presented with 5 days of cough, fever, myalgia. Respiratory virus panel positive for influenza A. Chest x-ray without acute infiltrate. No fever, no WBC count Was outside the 48-hour window for Tamiflu and hence was not initiated.  Doing well on supportive care.   Type 2 diabetes mellitus / Diabetic neuropathy, POA: A1c 7 on 01/02/2023. PTA meds-Tresiba 60 units daily, Mounjaro Currently on Lantus 35 units daily and SSI/Accu-Cheks.  Blood sugar slightly elevated again.  Will increase Semglee to 40 units today.  PTA reportedly on gabapentin 1600 mg 3 times daily. Since dose was unable to be confirmed, pt was continued on 400 mg 3 times daily instead   Hypokalemia/hypomagnesemia: Improved   Paroxysmal A-fib Rates controlled on PTA meds Coreg, amiodarone  -Eliquis held secondary to bleeding from CVL today   Essential hypertension PTA meds- Coreg 3.125 mg twice daily, Lasix 160 mg daily, currently on home dose of Coreg with amlodipine 5 mg added -Lasix was increased from 40mg  bid to 80mg  bid on 2/14, however with  resultant increase of Cr  up to 3.16. Lasix subsequently held -Cr has since improved with gentle oral hydration   Anemia of chronic disease: Baseline hemoglobin between 10.5 11.5.   -Hgb remains  stable. Bleeding recently noted at CVL site, eliquis held for now -Cont on heparin gtt for now   CAD/stent Carotid artery stenosis HLD Continue PTA meds -Plavix, Crestor -Eliquis held for now, continue heparin gtt and monitor for bleed   Hypothyroidism Continue Synthroid   Morbid Obesity  Body mass index is 39.99 kg/m. Patient has been advised to make an attempt to improve diet and exercise patterns to aid in weight loss.   Obstructive sleep apnea Patient reported good compliance on home nocturnal CPAP   Gout Allopurinol, colchicine   Depression Cymbalta   AKI on CKD stage IIIb: Baseline creatinine appears to be 1.9-2.2.  Creatinine peaked to 3.14 that started to trend up after increasing lasix dose to 80mg  -Lasix now held -Allow pt to self-hydrate given propensity for developing edema in the setting of CKD and heart disease -Cr has improved with oral hydration, stable -cont to follow bmet trends   Subjective: Feeling apprehensive about resuming eliquis. Is agreeable to trial of heparin instead  Physical Exam: Vitals:   04/11/23 0803 04/11/23 1452 04/11/23 2000 04/12/23 0429  BP: (!) 157/68 (!) 134/94 (!) 143/63 128/62  Pulse: (!) 58 64 61 (!) 54  Resp: 16 19 19 20   Temp: 98.1 F (36.7 C) 97.7 F (36.5 C) 97.6 F (36.4 C) 97.7 F (36.5 C)  TempSrc: Axillary Oral Oral Oral  SpO2:   100% 96%  Weight:    129.2 kg  Height:       General exam: Conversant, in no acute distress Respiratory system: normal chest rise, clear, no audible wheezing Cardiovascular system: regular rhythm, s1-s2 Gastrointestinal system: Nondistended, nontender, pos BS Central nervous system: No seizures, no tremors Extremities: No cyanosis, no joint deformities Skin: No rashes, no pallor Psychiatry: Affect normal // no auditory hallucinations   Data Reviewed:  Labs reviewed: Na 138, K 3.9, Cr 2.62, WBC 6.1, Hgb 9.4, Plts 198  Family Communication: Pt in room, family not at  bedside  Disposition: Status is: Inpatient Remains inpatient appropriate because: Severity of illness  Planned Discharge Destination: Home    Author: Rickey Barbara, MD 04/12/2023 5:18 PM  For on call review [email or web address].

## 2023-04-12 NOTE — Progress Notes (Signed)
 PHARMACY - ANTICOAGULATION CONSULT NOTE  Pharmacy Consult for heparin Indication: atrial fibrillation  Allergies  Allergen Reactions   Doxycycline Hives and Rash   Septra [Bactrim] Itching   Penicillins Rash    Allergy to All cillin drugs  Ancef given 9/24 with no obvious reaction   Metformin And Related Diarrhea and Nausea Only   Other Nausea And Vomiting    General Anesthesia - sometimes causes nausea and vomiting * OK to use scopolamine patch     Sulfamethoxazole-Trimethoprim Other (See Comments)   Wellbutrin [Bupropion]     Mood Changes    Patient Measurements: Height: 6\' 1"  (185.4 cm) Weight: 129.2 kg (284 lb 13.4 oz) IBW/kg (Calculated) : 79.9 Heparin Dosing Weight: 112.8 kg  Vital Signs: Temp: 97.7 F (36.5 C) (02/22 0429) Temp Source: Oral (02/22 0429) BP: 128/62 (02/22 0429) Pulse Rate: 54 (02/22 0429)  Labs: Recent Labs    04/10/23 0429 04/10/23 0750 04/10/23 0750 04/11/23 0625 04/11/23 1400 04/12/23 0415  HGB  --  9.8*   < > 10.1* 10.7* 9.4*  HCT  --  28.1*   < > 29.5* 31.1* 28.4*  PLT  --  207  --  203  --  198  CREATININE 2.63*  --   --  2.62*  --  2.62*  CKTOTAL  --   --   --  80  --   --    < > = values in this interval not displayed.    Estimated Creatinine Clearance: 37.5 mL/min (A) (by C-G formula based on SCr of 2.62 mg/dL (H)).   Medical History: Past Medical History:  Diagnosis Date   Anemia    Anginal pain (HCC) 04/2013   Cardiac cath showed patent stents with distal LAD, circumflex-OM and RCA disease in small vessels.   Anxiety    Atrial fibrillation (HCC) 12/2021   CAD S/P percutaneous coronary angioplasty 2003, 04/2012   status post PCI to LAD, circumflex-OM 2, RCA   Carotid artery occlusion    Chronic renal insufficiency, stage II (mild)    Chronic venous insufficiency    varicosities, no reflux; dopplers 04/14/12- valvular insufficiency in the R and L GSV   Complication of anesthesia    COPD, mild (HCC)    Depression     situaltional    Diabetes mellitus    Diabetic neuropathy (HCC) 08/24/2019   Dyslipidemia associated with type 2 diabetes mellitus (HCC)    GERD (gastroesophageal reflux disease)    History of cardioversion 12/2021   at [facility]   HTN (hypertension)    Hypothyroidism    Neuromuscular disorder (HCC)    neuropathy in feet   Neuropathy    notably improved following PCI with improved cardiac function   Obesity    Obesity, Class II, BMI 35.0-39.9, with comorbidity (see actual BMI)    BMI 39; wgt loss efforts in place; seeing Dietician   Orthostatic hypotension    PONV (postoperative nausea and vomiting)    "Patch Works"   Pulmonary hypertension (HCC) 04/2012   PA pressure   Sleep apnea    uses nightly   Vitamin D deficiency    per facility notes    Medications:  Scheduled:   allopurinol  100 mg Oral Daily   amiodarone  200 mg Oral BID   amLODipine  5 mg Oral Daily   apixaban  5 mg Oral BID   carvedilol  3.125 mg Oral BID WC   Chlorhexidine Gluconate Cloth  6 each Topical Q0600  clopidogrel  75 mg Oral Q breakfast   docusate sodium  100 mg Oral BID   DULoxetine  60 mg Oral Daily   ezetimibe  10 mg Oral Daily   gabapentin  400 mg Oral TID   guaiFENesin  600 mg Oral BID   insulin aspart  0-9 Units Subcutaneous Q6H   insulin glargine-yfgn  40 Units Subcutaneous Daily   levothyroxine  88 mcg Oral QAC breakfast   rosuvastatin  20 mg Oral QPM   sodium chloride flush  3 mL Intravenous Q12H   sodium chloride flush  3-10 mL Intravenous Q12H   Infusions:   ceFEPime (MAXIPIME) IV 2 g (04/12/23 0829)   DAPTOmycin 200 mL/hr at 04/11/23 1430    Assessment: 68 y/o male with history of afib on apixaban. Now having significant bleeding from CVL site. Pharmacy consulted for heparin.   Eliquis has been held d/t bleeding from CVL site. Bleeding still occurring from site per RN. Per discussion with provider, would like to keep pt on anticoagulation due to afib but would like  heparin therapy for now to monitor bleeding. Last eliquis dose was 2/20 PM. Hgb 10.7 > 9.4, plt wnl.   Goal of Therapy:  Heparin level 0.3-0.7 units/ml aPTT 66-102 seconds Monitor platelets by anticoagulation protocol: Yes   Plan:  Start heparin 1600 units/hr 8 hour heparin level and aPTT  Monitor CBC and for further bleeding from CVL site  Brad Lieurance I Khai Torbert 04/12/2023,12:31 PM

## 2023-04-12 NOTE — Plan of Care (Signed)

## 2023-04-12 NOTE — Progress Notes (Signed)
 PHARMACY - ANTICOAGULATION CONSULT NOTE  Pharmacy Consult for heparin Indication: atrial fibrillation  Allergies  Allergen Reactions   Doxycycline Hives and Rash   Septra [Bactrim] Itching   Penicillins Rash    Allergy to All cillin drugs  Ancef given 9/24 with no obvious reaction   Metformin And Related Diarrhea and Nausea Only   Other Nausea And Vomiting    General Anesthesia - sometimes causes nausea and vomiting * OK to use scopolamine patch     Sulfamethoxazole-Trimethoprim Other (See Comments)   Wellbutrin [Bupropion]     Mood Changes    Patient Measurements: Height: 6\' 1"  (185.4 cm) Weight: 129.2 kg (284 lb 13.4 oz) IBW/kg (Calculated) : 79.9 Heparin Dosing Weight: 112.8 kg  Vital Signs: Temp: 98.1 F (36.7 C) (02/22 1915) Temp Source: Oral (02/22 1915) BP: 149/76 (02/22 1915) Pulse Rate: 66 (02/22 1915)  Labs: Recent Labs    04/10/23 0429 04/10/23 0750 04/10/23 0750 04/11/23 0625 04/11/23 1400 04/12/23 0415 04/12/23 2153  HGB  --  9.8*   < > 10.1* 10.7* 9.4*  --   HCT  --  28.1*   < > 29.5* 31.1* 28.4*  --   PLT  --  207  --  203  --  198  --   APTT  --   --   --   --   --   --  60*  HEPARINUNFRC  --   --   --   --   --   --  >1.10*  CREATININE 2.63*  --   --  2.62*  --  2.62*  --   CKTOTAL  --   --   --  80  --   --   --    < > = values in this interval not displayed.    Estimated Creatinine Clearance: 37.5 mL/min (A) (by C-G formula based on SCr of 2.62 mg/dL (H)).   Medical History: Past Medical History:  Diagnosis Date   Anemia    Anginal pain (HCC) 04/2013   Cardiac cath showed patent stents with distal LAD, circumflex-OM and RCA disease in small vessels.   Anxiety    Atrial fibrillation (HCC) 12/2021   CAD S/P percutaneous coronary angioplasty 2003, 04/2012   status post PCI to LAD, circumflex-OM 2, RCA   Carotid artery occlusion    Chronic renal insufficiency, stage II (mild)    Chronic venous insufficiency    varicosities, no reflux;  dopplers 04/14/12- valvular insufficiency in the R and L GSV   Complication of anesthesia    COPD, mild (HCC)    Depression    situaltional    Diabetes mellitus    Diabetic neuropathy (HCC) 08/24/2019   Dyslipidemia associated with type 2 diabetes mellitus (HCC)    GERD (gastroesophageal reflux disease)    History of cardioversion 12/2021   at [facility]   HTN (hypertension)    Hypothyroidism    Neuromuscular disorder (HCC)    neuropathy in feet   Neuropathy    notably improved following PCI with improved cardiac function   Obesity    Obesity, Class II, BMI 35.0-39.9, with comorbidity (see actual BMI)    BMI 39; wgt loss efforts in place; seeing Dietician   Orthostatic hypotension    PONV (postoperative nausea and vomiting)    "Patch Works"   Pulmonary hypertension (HCC) 04/2012   PA pressure   Sleep apnea    uses nightly   Vitamin D deficiency    per  facility notes    Medications:  Scheduled:   allopurinol  100 mg Oral Daily   amiodarone  200 mg Oral BID   amLODipine  5 mg Oral Daily   carvedilol  3.125 mg Oral BID WC   Chlorhexidine Gluconate Cloth  6 each Topical Q0600   clopidogrel  75 mg Oral Q breakfast   docusate sodium  100 mg Oral BID   DULoxetine  60 mg Oral Daily   ezetimibe  10 mg Oral Daily   gabapentin  400 mg Oral TID   guaiFENesin  600 mg Oral BID   insulin aspart  0-9 Units Subcutaneous Q6H   insulin glargine-yfgn  40 Units Subcutaneous Daily   levothyroxine  88 mcg Oral QAC breakfast   rosuvastatin  20 mg Oral QPM   sodium chloride flush  3 mL Intravenous Q12H   sodium chloride flush  3-10 mL Intravenous Q12H   Infusions:   ceFEPime (MAXIPIME) IV 2 g (04/12/23 2121)   DAPTOmycin 700 mg (04/12/23 1309)   heparin 1,600 Units/hr (04/12/23 1321)    Assessment: 68 y/o male with history of afib on apixaban. Now having significant bleeding from CVL site. Pharmacy consulted for heparin.   Eliquis has been held d/t bleeding from CVL site.  Bleeding still occurring from site per RN. Per discussion with provider, would like to keep pt on anticoagulation due to afib but would like heparin therapy for now to monitor bleeding. Last eliquis dose was 2/20 PM. Hgb 10.7 > 9.4, plt wnl.   2/22 PM: initial aPTT 60 sec, below goal.  Heparin level > 1.1 impacted by recent DOAC use.  RN reported no further bleeding from CVL, but heparin was paused from 2130-2200 for cefepime infusion.  Goal of Therapy:  Heparin level 0.3-0.7 units/ml aPTT 66-102 seconds Monitor platelets by anticoagulation protocol: Yes   Plan:  Increase heparin 1700 units/hr (conservative given recent bleeding, infusion pause immediately prior to level draw) 8 hour heparin level and aPTT  Monitor CBC and for further bleeding from CVL site  Trixie Rude, PharmD Clinical Pharmacist 04/12/2023  10:30 PM

## 2023-04-13 DIAGNOSIS — N179 Acute kidney failure, unspecified: Secondary | ICD-10-CM | POA: Diagnosis not present

## 2023-04-13 DIAGNOSIS — L02415 Cutaneous abscess of right lower limb: Secondary | ICD-10-CM | POA: Diagnosis not present

## 2023-04-13 DIAGNOSIS — L97314 Non-pressure chronic ulcer of right ankle with necrosis of bone: Secondary | ICD-10-CM | POA: Diagnosis not present

## 2023-04-13 DIAGNOSIS — J101 Influenza due to other identified influenza virus with other respiratory manifestations: Secondary | ICD-10-CM | POA: Diagnosis not present

## 2023-04-13 LAB — CBC
HCT: 29.8 % — ABNORMAL LOW (ref 39.0–52.0)
Hemoglobin: 10.4 g/dL — ABNORMAL LOW (ref 13.0–17.0)
MCH: 29.7 pg (ref 26.0–34.0)
MCHC: 34.9 g/dL (ref 30.0–36.0)
MCV: 85.1 fL (ref 80.0–100.0)
Platelets: 193 10*3/uL (ref 150–400)
RBC: 3.5 MIL/uL — ABNORMAL LOW (ref 4.22–5.81)
RDW: 14.8 % (ref 11.5–15.5)
WBC: 6.6 10*3/uL (ref 4.0–10.5)
nRBC: 0 % (ref 0.0–0.2)

## 2023-04-13 LAB — COMPREHENSIVE METABOLIC PANEL
ALT: 57 U/L — ABNORMAL HIGH (ref 0–44)
AST: 66 U/L — ABNORMAL HIGH (ref 15–41)
Albumin: 2.3 g/dL — ABNORMAL LOW (ref 3.5–5.0)
Alkaline Phosphatase: 98 U/L (ref 38–126)
Anion gap: 7 (ref 5–15)
BUN: 35 mg/dL — ABNORMAL HIGH (ref 8–23)
CO2: 24 mmol/L (ref 22–32)
Calcium: 8.7 mg/dL — ABNORMAL LOW (ref 8.9–10.3)
Chloride: 106 mmol/L (ref 98–111)
Creatinine, Ser: 2.85 mg/dL — ABNORMAL HIGH (ref 0.61–1.24)
GFR, Estimated: 23 mL/min — ABNORMAL LOW (ref >60)
Glucose, Bld: 170 mg/dL — ABNORMAL HIGH (ref 70–99)
Potassium: 3.8 mmol/L (ref 3.5–5.1)
Sodium: 137 mmol/L (ref 135–145)
Total Bilirubin: 0.5 mg/dL (ref 0.0–1.2)
Total Protein: 5.8 g/dL — ABNORMAL LOW (ref 6.5–8.1)

## 2023-04-13 LAB — APTT
aPTT: 93 s — ABNORMAL HIGH (ref 24–36)
aPTT: 101 s — ABNORMAL HIGH (ref 24–36)

## 2023-04-13 LAB — AEROBIC/ANAEROBIC CULTURE W GRAM STAIN (SURGICAL/DEEP WOUND)

## 2023-04-13 LAB — HEPARIN LEVEL (UNFRACTIONATED): Heparin Unfractionated: >1.1 [IU]/mL — ABNORMAL HIGH (ref 0.30–0.70)

## 2023-04-13 LAB — GLUCOSE, CAPILLARY
Glucose-Capillary: 149 mg/dL — ABNORMAL HIGH (ref 70–99)
Glucose-Capillary: 169 mg/dL — ABNORMAL HIGH (ref 70–99)
Glucose-Capillary: 184 mg/dL — ABNORMAL HIGH (ref 70–99)
Glucose-Capillary: 203 mg/dL — ABNORMAL HIGH (ref 70–99)

## 2023-04-13 NOTE — Progress Notes (Signed)
 Progress Note   Patient: Harold [name] ZOX:[id number] DOB: October 19, 1955 DOA: 03/27/2023     17 DOS: the patient was seen and examined on 04/13/2023   Brief hospital course: 68 y.o. male with PMH significant for morbid obesity, OSA, DM2, HTN, HLD, CAD/stent, A-fib on Eliquis, carotid artery stenosis, CKD, chronic anemia, anxiety/depression, diabetic neuropathy, GERD, hypothyroidism, COPD, pulmonary hypertension. 2/6, patient presented to the ED with complaint of cough, subjective fever, myalgia, generalized weakness for 5 days. Respiratory virus panel positive for influenza A. Chest x-ray negative for infiltrates. Out of for Tamiflu.  He improved from fluid with supportive care.   On admission, he was also noted to have right leg cellulitis. Per chart review, in November 2024, he underwent ORIF of the right ankle, with ensuing revision in December 2024.  He has subsequently been followed by wound care at home.  However over the course the last 1 week, patient had progressive right lateral malleolus erythema, swelling, tenderness, with some evidence of purulent drainage, X-ray showed soft tissue swelling most pronounced over the lateral malleolus, consistent with cellulitis, without evidence of subcutaneous gas or crepitus to suggest necrotizing fasciitis.  Orthopedics and vascular surgery consult appreciated.  Assessment and Plan: Right leg wound infection -Prior right ankle surgery (12/2022) complicated by postsurgical wound infection. S/p right ankle hardware removal, I&D and wound VAC placement -2/10 Dr. Jena Gauss In the setting of uncontrolled diabetes, and possible PAD.  S/p repeat debridement of the right ankle by Dr. Lajoyce Corners 04/02/2023.  Seen by ID [id number], antibiotics transitioned to cefepime and daptomycin.   Wound culture grew polymicrobial organisms.  ID consulted and recommended continuing current cefepime and daptomycin for 4 weeks since surgery EOT 05/12/2023 with weekly CK as outpatient..  Central line placed on 04/07/2023 with some post-procedural bleed noted.   Seen by PT OT, they recommended SNF, however patient will have to pay out-of-pocket for the co-pay which patient is unable to afford, ultimate decision to d/c home at time of d/c -Home setting was later found to be not safe. Per TOC, pt is now in [facility] program here   PAD Seen by vascular surgery.2/11, underwent aortogram, right leg angiogram, successful stenting of right superficial femoral artery.  Patient on Plavix and Eliquis which was resumed on 04/03/2023 after cleared by orthopedics. -With concerns of bleeding at central line site, pt is now on heparin gtt for now, tolerating with no evidence of acute blood loss   Acute diarrhea Resolved   Influenza A: Presented with 5 days of cough, fever, myalgia. Respiratory virus panel positive for influenza A. Chest x-ray without acute infiltrate. No fever, no WBC count Was outside the 48-hour window for Tamiflu and hence was not initiated.  Doing well on supportive care.   Type 2 diabetes mellitus / Diabetic neuropathy, POA: A1c 7 on 01/02/2023. PTA meds-Tresiba 60 units daily, Mounjaro Currently on Lantus 35 units daily and SSI/Accu-Cheks.  Blood sugar slightly elevated again.  Will increase Semglee to 40 units today.  PTA reportedly on gabapentin 1600 mg 3 times daily. Since dose was unable to be confirmed, pt was continued on 400 mg 3 times daily instead   Hypokalemia/hypomagnesemia: Improved   Paroxysmal A-fib Rates controlled on PTA meds Coreg, amiodarone  -CHADS-VASc of 4 -Eliquis held secondary to bleeding from CVL recently, now on heparin gtt with no acute bleed -If pt continues to not bleed, may consider re-trial of renally dosed eliquis in the next 48-72h   Essential hypertension PTA meds- Coreg 3.125 mg  twice daily, Lasix 160 mg daily, currently on home dose of Coreg with amlodipine 5 mg added -Lasix was increased from 40mg  bid to 80mg  bid on 2/14, however with   resultant increase of Cr up to 3.16. Lasix subsequently held -Cr has since improved and remain stable off diuretic   Anemia of chronic disease: Baseline hemoglobin between 10.5 11.5.   -Hgb remains stable. Bleeding recently noted at CVL site, eliquis held for now -Cont on heparin gtt for now, tolerating thus far without further bleed   CAD/stent Carotid artery stenosis HLD Continue PTA meds -Plavix, Crestor -Eliquis held for now, continue heparin gtt and monitor for bleed   Hypothyroidism Continue Synthroid   Morbid Obesity  Body mass index is 39.99 kg/m. Patient has been advised to make an attempt to improve diet and exercise patterns to aid in weight loss.   Obstructive sleep apnea Patient reported good compliance on home nocturnal CPAP   Gout Allopurinol, colchicine   Depression Cymbalta   AKI on CKD stage IIIb: Baseline creatinine appears to be 1.9-2.2.  Creatinine peaked to 3.14 that started to trend up after increasing lasix dose to 80mg  -Lasix remains held -Allow pt to self-hydrate given propensity for developing edema in the setting of CKD and heart disease -Cr has improved with oral hydration, stable -cont to follow bmet trends   Subjective: Without complaints this AM. No further bleeding since starting heparin gtt  Physical Exam: Vitals:   04/13/23 0320 04/13/23 0430 04/13/23 1132 04/13/23 1219  BP: (!) 148/60  (!) 133/53 (!) 133/56  Pulse: (!) 59 (!) 57 61   Resp: 18  16   Temp: 98.6 F (37 C)  (!) 97.3 F (36.3 C) (!) 97.5 F (36.4 C)  TempSrc: Oral  Oral   SpO2: 100% 100% 95%   Weight:  130.4 kg    Height:       General exam: Awake, laying in bed, in nad Respiratory system: Normal respiratory effort, no wheezing Cardiovascular system: regular rate, s1, s2, CVL site with old blood under dressing, no new blood seen Gastrointestinal system: Soft, nondistended, positive BS Central nervous system: CN2-12 grossly intact, strength intact Extremities:  Perfused, no clubbing Skin: Normal skin turgor, no notable skin lesions seen Psychiatry: Mood normal // no visual hallucinations   Data Reviewed:  Labs reviewed: Na 137, K 3.8, Cr 2.85, WBC 6.6, Hgb 10.4, Plts 193  Family Communication: Pt in room, family not at bedside  Disposition: Status is: Inpatient Remains inpatient appropriate because: Severity of illness  Planned Discharge Destination: Home    Author: Rickey Barbara, MD 04/13/2023 3:38 PM  For on call review [email or web address].

## 2023-04-13 NOTE — Progress Notes (Signed)
 Pharmacy Antibiotic Note  Harold [name] is a 68 y.o. 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 Plan for daptomycin and cefepime per ID with anticipated stop date of 05/12/23. WBC is 6.6. Last CK was 80 - okay to continue statin for now with recent PV stent. Scr up slightly to 2.85  Plan: Continue Cefepime 2g q12hr Continue Daptomycin 700 mg q24hr  Will follow renal function, cultures and clinical progress   Height: 6\' 1"  (185.4 cm) Weight: 130.4 kg (287 lb 7.7 oz) IBW/kg (Calculated) : 79.9  Temp (24hrs), Avg:98.4 F (36.9 C), Min:98.1 F (36.7 C), Max:98.6 F (37 C)  Recent Labs  Lab 04/07/23 0411 04/09/23 0209 04/10/23 0429 04/10/23 0750 04/11/23 0625 04/12/23 0415 04/13/23 0815  WBC 8.2 7.8  --  8.3 7.7 6.1 6.6  CREATININE 2.59* 3.16* 2.63*  --  2.62* 2.62*  --     Estimated Creatinine Clearance: 37.7 mL/min (A) (by C-G formula based on SCr of 2.62 mg/dL (H)).    Allergies  Allergen Reactions   Doxycycline Hives and Rash   Septra [Bactrim] Itching   Penicillins Rash    Allergy to All cillin drugs  Ancef given 9/24 with no obvious reaction   Metformin And Related Diarrhea and Nausea Only   Other Nausea And Vomiting    General Anesthesia - sometimes causes nausea and vomiting * OK to use scopolamine patch     Sulfamethoxazole-Trimethoprim Other (See Comments)   Wellbutrin [Bupropion]     Mood Changes    Thank you for involving pharmacy in the patient's care.   Theotis Burrow, PharmD PGY1 Acute Care Pharmacy Resident  04/13/2023 9:25 AM

## 2023-04-13 NOTE — Progress Notes (Addendum)
 PHARMACY - ANTICOAGULATION CONSULT NOTE  Pharmacy Consult for heparin Indication: atrial fibrillation  Allergies  Allergen Reactions   Doxycycline Hives and Rash   Septra [Bactrim] Itching   Penicillins Rash    Allergy to All cillin drugs  Ancef given 9/24 with no obvious reaction   Metformin And Related Diarrhea and Nausea Only   Other Nausea And Vomiting    General Anesthesia - sometimes causes nausea and vomiting * OK to use scopolamine patch     Sulfamethoxazole-Trimethoprim Other (See Comments)   Wellbutrin [Bupropion]     Mood Changes    Patient Measurements: Height: 6\' 1"  (185.4 cm) Weight: 130.4 kg (287 lb 7.7 oz) IBW/kg (Calculated) : 79.9 Heparin Dosing Weight: 112.8 kg  Vital Signs: Temp: 98.6 F (37 C) (02/23 0320) Temp Source: Oral (02/23 0320) BP: 148/60 (02/23 0320) Pulse Rate: 57 (02/23 0430)  Labs: Recent Labs    04/11/23 0625 04/11/23 1400 04/12/23 0415 04/12/23 2153 04/13/23 0815  HGB 10.1* 10.7* 9.4*  --  10.4*  HCT 29.5* 31.1* 28.4*  --  29.8*  PLT 203  --  198  --  193  APTT  --   --   --  60* 93*  HEPARINUNFRC  --   --   --  >1.10* >1.10*  CREATININE 2.62*  --  2.62*  --   --   CKTOTAL 80  --   --   --   --     Estimated Creatinine Clearance: 37.7 mL/min (A) (by C-G formula based on SCr of 2.62 mg/dL (H)).   Medical History: Past Medical History:  Diagnosis Date   Anemia    Anginal pain (HCC) 04/2013   Cardiac cath showed patent stents with distal LAD, circumflex-OM and RCA disease in small vessels.   Anxiety    Atrial fibrillation (HCC) 12/2021   CAD S/P percutaneous coronary angioplasty 2003, 04/2012   status post PCI to LAD, circumflex-OM 2, RCA   Carotid artery occlusion    Chronic renal insufficiency, stage II (mild)    Chronic venous insufficiency    varicosities, no reflux; dopplers 04/14/12- valvular insufficiency in the R and L GSV   Complication of anesthesia    COPD, mild (HCC)    Depression    situaltional     Diabetes mellitus    Diabetic neuropathy (HCC) 08/24/2019   Dyslipidemia associated with type 2 diabetes mellitus (HCC)    GERD (gastroesophageal reflux disease)    History of cardioversion 12/2021   at [facility]   HTN (hypertension)    Hypothyroidism    Neuromuscular disorder (HCC)    neuropathy in feet   Neuropathy    notably improved following PCI with improved cardiac function   Obesity    Obesity, Class II, BMI 35.0-39.9, with comorbidity (see actual BMI)    BMI 39; wgt loss efforts in place; seeing Dietician   Orthostatic hypotension    PONV (postoperative nausea and vomiting)    "Patch Works"   Pulmonary hypertension (HCC) 04/2012   PA pressure   Sleep apnea    uses nightly   Vitamin D deficiency    per facility notes    Medications:  Scheduled:   allopurinol  100 mg Oral Daily   amiodarone  200 mg Oral BID   amLODipine  5 mg Oral Daily   carvedilol  3.125 mg Oral BID WC   Chlorhexidine Gluconate Cloth  6 each Topical Q0600   clopidogrel  75 mg Oral Q breakfast  docusate sodium  100 mg Oral BID   DULoxetine  60 mg Oral Daily   ezetimibe  10 mg Oral Daily   gabapentin  400 mg Oral TID   guaiFENesin  600 mg Oral BID   insulin aspart  0-9 Units Subcutaneous Q6H   insulin glargine-yfgn  40 Units Subcutaneous Daily   levothyroxine  88 mcg Oral QAC breakfast   rosuvastatin  20 mg Oral QPM   sodium chloride flush  3 mL Intravenous Q12H   sodium chloride flush  3-10 mL Intravenous Q12H   Infusions:   ceFEPime (MAXIPIME) IV 2 g (04/13/23 6045)   DAPTOmycin 700 mg (04/12/23 1309)   heparin 1,700 Units/hr (04/13/23 0534)    Assessment: 68 y/o male with history of afib on apixaban. Now having significant bleeding from CVL site. Pharmacy consulted for heparin.   Eliquis has been held d/t bleeding from CVL site. Bleeding still occurring from site per RN. Per discussion with provider, would like to keep pt on anticoagulation due to afib but would like heparin  therapy for now to monitor bleeding. Last eliquis dose was 2/20 PM.   Heparin level still falsely elevated due to recent DOAC use > 1.1, aPTT therapeutic at 93 on 1700 units/hr. Per RN, no further bleeding from CVL site.    Goal of Therapy:  Heparin level 0.3-0.7 units/ml aPTT 66-102 seconds Monitor platelets by anticoagulation protocol: Yes   Plan:  Continue heparin at 1700 units/hr 8 hour confirmatory aPTT  Monitor CBC and for further bleeding  F/u plans to transition back to oral therapy  Thank you for involving pharmacy in the patient's care.   Theotis Burrow, PharmD PGY1 Acute Care Pharmacy Resident  04/13/2023 9:14 AM

## 2023-04-13 NOTE — Progress Notes (Signed)
   04/13/23 2021  BiPAP/CPAP/SIPAP  Reason BIPAP/CPAP not in use Non-compliant

## 2023-04-13 NOTE — Progress Notes (Addendum)
 PHARMACY - ANTICOAGULATION CONSULT NOTE  Pharmacy Consult for heparin Indication: atrial fibrillation  Allergies  Allergen Reactions   Doxycycline Hives and Rash   Septra [Bactrim] Itching   Penicillins Rash    Allergy to All cillin drugs  Ancef given 9/24 with no obvious reaction   Metformin And Related Diarrhea and Nausea Only   Other Nausea And Vomiting    General Anesthesia - sometimes causes nausea and vomiting * OK to use scopolamine patch     Sulfamethoxazole-Trimethoprim Other (See Comments)   Wellbutrin [Bupropion]     Mood Changes    Patient Measurements: Height: 6\' 1"  (185.4 cm) Weight: 130.4 kg (287 lb 7.7 oz) IBW/kg (Calculated) : 79.9 Heparin Dosing Weight: 112.8 kg  Vital Signs: Temp: 97.5 F (36.4 C) (02/23 1219) Temp Source: Oral (02/23 1132) BP: 133/56 (02/23 1219) Pulse Rate: 61 (02/23 1132)  Labs: Recent Labs    04/11/23 0625 04/11/23 1400 04/12/23 0415 04/12/23 2153 04/13/23 0815 04/13/23 1616  HGB 10.1* 10.7* 9.4*  --  10.4*  --   HCT 29.5* 31.1* 28.4*  --  29.8*  --   PLT 203  --  198  --  193  --   APTT  --   --   --  60* 93* 101*  HEPARINUNFRC  --   --   --  >1.10* >1.10*  --   CREATININE 2.62*  --  2.62*  --  2.85*  --   CKTOTAL 80  --   --   --   --   --     Estimated Creatinine Clearance: 34.6 mL/min (A) (by C-G formula based on SCr of 2.85 mg/dL (H)).   Medical History: Past Medical History:  Diagnosis Date   Anemia    Anginal pain (HCC) 04/2013   Cardiac cath showed patent stents with distal LAD, circumflex-OM and RCA disease in small vessels.   Anxiety    Atrial fibrillation (HCC) 12/2021   CAD S/P percutaneous coronary angioplasty 2003, 04/2012   status post PCI to LAD, circumflex-OM 2, RCA   Carotid artery occlusion    Chronic renal insufficiency, stage II (mild)    Chronic venous insufficiency    varicosities, no reflux; dopplers 04/14/12- valvular insufficiency in the R and L GSV   Complication of anesthesia    COPD,  mild (HCC)    Depression    situaltional    Diabetes mellitus    Diabetic neuropathy (HCC) 08/24/2019   Dyslipidemia associated with type 2 diabetes mellitus (HCC)    GERD (gastroesophageal reflux disease)    History of cardioversion 12/2021   at [facility]   HTN (hypertension)    Hypothyroidism    Neuromuscular disorder (HCC)    neuropathy in feet   Neuropathy    notably improved following PCI with improved cardiac function   Obesity    Obesity, Class II, BMI 35.0-39.9, with comorbidity (see actual BMI)    BMI 39; wgt loss efforts in place; seeing Dietician   Orthostatic hypotension    PONV (postoperative nausea and vomiting)    "[employer]"   Pulmonary hypertension (HCC) 04/2012   PA pressure   Sleep apnea    uses nightly   Vitamin D deficiency    per facility notes    Medications:  Scheduled:   allopurinol  100 mg Oral Daily   amiodarone  200 mg Oral BID   amLODipine  5 mg Oral Daily   carvedilol  3.125 mg Oral BID WC  Chlorhexidine Gluconate Cloth  6 each Topical Q0600   clopidogrel  75 mg Oral Q breakfast   docusate sodium  100 mg Oral BID   DULoxetine  60 mg Oral Daily   ezetimibe  10 mg Oral Daily   gabapentin  400 mg Oral TID   guaiFENesin  600 mg Oral BID   insulin aspart  0-9 Units Subcutaneous Q6H   insulin glargine-yfgn  40 Units Subcutaneous Daily   levothyroxine  88 mcg Oral QAC breakfast   rosuvastatin  20 mg Oral QPM   sodium chloride flush  3 mL Intravenous Q12H   sodium chloride flush  3-10 mL Intravenous Q12H   Infusions:   ceFEPime (MAXIPIME) IV 2 g (04/13/23 1610)   DAPTOmycin 700 mg (04/13/23 1321)   heparin 1,700 Units/hr (04/13/23 0534)    Assessment: 68 y/o male with history of afib on apixaban. Now having significant bleeding from CVL site. Pharmacy consulted for heparin.   Eliquis has been held d/t bleeding from CVL site. Bleeding still occurring from site per RN. Per discussion with provider, would like to keep pt on  anticoagulation due to afib but would like heparin therapy for now to monitor bleeding. Last eliquis dose was 2/20 PM.   Heparin level still falsely elevated due to recent DOAC use > 1.1, aPTT therapeutic at 93 on 1700 units/hr. Per RN, no further bleeding from CVL site.   2/23 PM:  confirmatory aPTT 101 sec on upper end of therapeutic range on 1700 units/hr (aPTT 60 sec on 1600 units/hr).  No bleeding or infusion issues per RN.  SCr up again today (BL SCr 2.1) which could account for elevated aPTT but with recent bleeding, will empirically reduce to stay in range.   Goal of Therapy:  Heparin level 0.3-0.7 units/ml aPTT 66-102 seconds Monitor platelets by anticoagulation protocol: Yes   Plan:  Decrease heparin IV slightly to 1650 units/hr to remain in therapeutic range Daily aPTT/heparin level until correlating Monitor CBC and for further bleeding  F/u plans to transition back to oral therapy  Thank you for involving pharmacy in the patient's care.   Trixie Rude, PharmD Clinical Pharmacist 04/13/2023  5:04 PM

## 2023-04-13 NOTE — Plan of Care (Signed)
   Problem: Nutrition: Goal: Adequate nutrition will be maintained Outcome: Progressing

## 2023-04-13 NOTE — Progress Notes (Signed)
   04/13/23 0030  BiPAP/CPAP/SIPAP  Reason BIPAP/CPAP not in use Non-compliant (refused)

## 2023-04-14 ENCOUNTER — Other Ambulatory Visit (HOSPITAL_COMMUNITY): Payer: Self-pay

## 2023-04-14 DIAGNOSIS — N179 Acute kidney failure, unspecified: Secondary | ICD-10-CM | POA: Diagnosis not present

## 2023-04-14 DIAGNOSIS — L97314 Non-pressure chronic ulcer of right ankle with necrosis of bone: Secondary | ICD-10-CM | POA: Diagnosis not present

## 2023-04-14 DIAGNOSIS — L02415 Cutaneous abscess of right lower limb: Secondary | ICD-10-CM | POA: Diagnosis not present

## 2023-04-14 DIAGNOSIS — J101 Influenza due to other identified influenza virus with other respiratory manifestations: Secondary | ICD-10-CM | POA: Diagnosis not present

## 2023-04-14 LAB — COMPREHENSIVE METABOLIC PANEL
ALT: 50 U/L — ABNORMAL HIGH (ref 0–44)
AST: 47 U/L — ABNORMAL HIGH (ref 15–41)
Albumin: 2.2 g/dL — ABNORMAL LOW (ref 3.5–5.0)
Alkaline Phosphatase: 93 U/L (ref 38–126)
Anion gap: 11 (ref 5–15)
BUN: 36 mg/dL — ABNORMAL HIGH (ref 8–23)
CO2: 23 mmol/L (ref 22–32)
Calcium: 8.8 mg/dL — ABNORMAL LOW (ref 8.9–10.3)
Chloride: 104 mmol/L (ref 98–111)
Creatinine, Ser: 2.98 mg/dL — ABNORMAL HIGH (ref 0.61–1.24)
GFR, Estimated: 22 mL/min — ABNORMAL LOW (ref >60)
Glucose, Bld: 154 mg/dL — ABNORMAL HIGH (ref 70–99)
Potassium: 3.7 mmol/L (ref 3.5–5.1)
Sodium: 138 mmol/L (ref 135–145)
Total Bilirubin: 0.4 mg/dL (ref 0.0–1.2)
Total Protein: 5.8 g/dL — ABNORMAL LOW (ref 6.5–8.1)

## 2023-04-14 LAB — CBC
HCT: 29.2 % — ABNORMAL LOW (ref 39.0–52.0)
Hemoglobin: 9.8 g/dL — ABNORMAL LOW (ref 13.0–17.0)
MCH: 28.9 pg (ref 26.0–34.0)
MCHC: 33.6 g/dL (ref 30.0–36.0)
MCV: 86.1 fL (ref 80.0–100.0)
Platelets: 183 10*3/uL (ref 150–400)
RBC: 3.39 MIL/uL — ABNORMAL LOW (ref 4.22–5.81)
RDW: 14.8 % (ref 11.5–15.5)
WBC: 6.4 10*3/uL (ref 4.0–10.5)
nRBC: 0 % (ref 0.0–0.2)

## 2023-04-14 LAB — GLUCOSE, CAPILLARY
Glucose-Capillary: 156 mg/dL — ABNORMAL HIGH (ref 70–99)
Glucose-Capillary: 174 mg/dL — ABNORMAL HIGH (ref 70–99)
Glucose-Capillary: 197 mg/dL — ABNORMAL HIGH (ref 70–99)

## 2023-04-14 LAB — APTT: aPTT: 72 s — ABNORMAL HIGH (ref 24–36)

## 2023-04-14 LAB — MAGNESIUM: Magnesium: 2.1 mg/dL (ref 1.7–2.4)

## 2023-04-14 LAB — HEPARIN LEVEL (UNFRACTIONATED): Heparin Unfractionated: >1.1 [IU]/mL — ABNORMAL HIGH (ref 0.30–0.70)

## 2023-04-14 MED ORDER — ALBUMIN HUMAN 25 % IV SOLN
25.0000 g | Freq: Once | INTRAVENOUS | Status: AC
  Administered 2023-04-14: 25 g via INTRAVENOUS
  Filled 2023-04-14: qty 100

## 2023-04-14 MED ORDER — LACTATED RINGERS IV SOLN: INTRAVENOUS | Status: DC

## 2023-04-14 MED ORDER — INSULIN GLARGINE 100 UNIT/ML ~~LOC~~ SOLN
40.0000 [IU] | Freq: Every day | SUBCUTANEOUS | Status: DC
  Administered 2023-04-15 – 2023-04-29 (×15): 40 [IU] via SUBCUTANEOUS
  Filled 2023-04-14 (×16): qty 0.4

## 2023-04-14 NOTE — Progress Notes (Signed)
 Occupational Therapy Treatment Patient Details Name: Harold [name] MRN: [id number] DOB: April 15, 1955 Today's Date: 04/14/2023   History of present illness 68 y.o. male presents to [facility] 03/27/23 with productive cough and generalized weakness. Admitted with influenza A and cellulitis of R foot s/p hardware removal of the R ankle and application of wound vac on 2/10. Prior admit 11/24 for R ankle ORIF and in 12/24 for revision. S/p excisional debridement of right ankle on 2/12. Pt underwent central line placement on 04/07/2023 however he has been having issues with recurrent bleeding from the insertion site which has finally stopped. Significant PMH: HTN, HLD, CAD s/p PCI, COPD, DM2, CKD stage IIIb, PVD, gout, OSA, neuropathy, COPD, a-fib, CAD s/p percutaneous coronary angioplasty, angina, obesity   OT comments  Patient able to get to EOB with supervision with use of bed rails. Patient required max assist to doff RLE splint and donn CAM boot. Patient able to ambulate short distance to recliner and stated he felt dizzy. BP 110/61(76). After short rest patient wanted to attempt standing at sink for grooming tasks and following ~1 minute of standing complained of dizziness with BP 89/54(66). Patient completed self care tasks seated in recliner due to soft BP and at end of session had BP of 102/65(76). Patient states he has AE at home for LB dressing and did not believe he needed a review. Discharge recommendations changed from SNF to [facility] due to patient's progress and patient's husband able to provide assistance. Acute OT to continue to follow to address established goals to facilitate safe DC.       If plan is discharge home, recommend the following:  A little help with walking and/or transfers;A lot of help with bathing/dressing/bathroom;Assistance with cooking/housework;Assist for transportation;Help with stairs or ramp for entrance   Equipment Recommendations  Other (comment) (bariatric BSC)     Recommendations for Other Services      Precautions / Restrictions Precautions Precautions: Fall Recall of Precautions/Restrictions: Impaired Precaution/Restrictions Comments: Watch BP; wound vac RLE Required Braces or Orthoses: Other Brace Other Brace: R CAM boot for ambulation, R splint in bed Restrictions Weight Bearing Restrictions Per Provider Order: Yes RLE Weight Bearing Per Provider Order: Touchdown weight bearing       Mobility Bed Mobility Overal bed mobility: Needs Assistance Bed Mobility: Supine to Sit     Supine to sit: HOB elevated, Used rails, Supervision     General bed mobility comments: reliant on bed rail to assist    Transfers Overall transfer level: Needs assistance Equipment used: Rolling walker (2 wheels) Transfers: Sit to/from Stand, Bed to chair/wheelchair/BSC Sit to Stand: Min assist, From elevated surface     Step pivot transfers: Min assist     General transfer comment: cues for hand placement and bed elevated     Balance Overall balance assessment: Needs assistance Sitting-balance support: No upper extremity supported, Feet supported Sitting balance-Leahy Scale: Fair Sitting balance - Comments: EOB Postural control: Posterior lean Standing balance support: Bilateral upper extremity supported, During functional activity, Reliant on assistive device for balance Standing balance-Leahy Scale: Poor Standing balance comment: reliant on BUE support                           ADL either performed or assessed with clinical judgement   ADL Overall ADL's : Needs assistance/impaired     Grooming: Wash/dry hands;Wash/dry face;Oral care;Set up;Sitting Grooming Details (indicate cue type and reason): unable to attempt in standing due to  soft BP Upper Body Bathing: Set up;Sitting       Upper Body Dressing : Set up;Sitting Upper Body Dressing Details (indicate cue type and reason): gown Lower Body Dressing: Maximal  assistance;Sitting/lateral leans Lower Body Dressing Details (indicate cue type and reason): to doff RLE splint and donn CAM boot Toilet Transfer: Minimal assistance;Rolling walker (2 wheels) Toilet Transfer Details (indicate cue type and reason): simulated to recliner           General ADL Comments: Patient states that his huband could assist with self care    Extremity/Trunk Assessment Upper Extremity Assessment Upper Extremity Assessment: Overall WFL for tasks assessed            Vision   Vision Assessment?: No apparent visual deficits   Perception     Praxis     Communication Communication Communication: No apparent difficulties   Cognition Arousal: Alert Behavior During Therapy: WFL for tasks assessed/performed Cognition: No apparent impairments                               Following commands: Intact        Cueing   Cueing Techniques: Verbal cues, Tactile cues  Exercises      Shoulder Instructions       General Comments BP seated after transfers and patient with complaints of dizziness 110/61(76). Patient wanted to attempt standing again and following 1 minute of standing BP 89/54(66), BP seated at end of session 102/65(76)    Pertinent Vitals/ Pain       Pain Assessment Pain Assessment: Faces Faces Pain Scale: Hurts a little bit Pain Location: Back Pain Descriptors / Indicators: Sore, Discomfort, Aching Pain Intervention(s): Limited activity within patient's tolerance, Monitored during session, Repositioned  Home Living                                          Prior Functioning/Environment              Frequency  Min 1X/week        Progress Toward Goals  OT Goals(current goals can now be found in the care plan section)  Progress towards OT goals: Progressing toward goals  Acute Rehab OT Goals Patient Stated Goal: to go home OT Goal Formulation: With patient Time For Goal Achievement:  04/15/23 Potential to Achieve Goals: Good ADL Goals Pt Will Perform Grooming: with set-up;sitting Pt Will Perform Lower Body Dressing: with min assist;with adaptive equipment;with caregiver independent in assisting;sitting/lateral leans;sit to/from stand Pt Will Transfer to Toilet: stand pivot transfer;with min assist;bedside commode Pt Will Perform Toileting - Clothing Manipulation and hygiene: with mod assist;sitting/lateral leans;sit to/from stand  Plan      Co-evaluation                 AM-PAC OT "6 Clicks" Daily Activity     Outcome Measure   Help from another person eating meals?: None Help from another person taking care of personal grooming?: None Help from another person toileting, which includes using toliet, bedpan, or urinal?: A Lot Help from another person bathing (including washing, rinsing, drying)?: A Lot Help from another person to put on and taking off regular upper body clothing?: A Little Help from another person to put on and taking off regular lower body clothing?: Total 6 Click Score: 16    End of  Session Equipment Utilized During Treatment: Gait belt;Rolling walker (2 wheels)  OT Visit Diagnosis: Unsteadiness on feet (R26.81);Other abnormalities of gait and mobility (R26.89);Muscle weakness (generalized) (M62.81);Pain Pain - Right/Left: Right Pain - part of body: Ankle and joints of foot   Activity Tolerance Patient tolerated treatment well;Other (comment) (soft BP)   Patient Left in chair;with call bell/phone within reach;with chair alarm set   Nurse Communication Mobility status        Time: 1008-1040 OT Time Calculation (min): 32 min  Charges: OT General Charges $OT Visit: 1 Visit OT Treatments $Self Care/Home Management : 23-37 mins  Alfonse Flavors, OTA Acute Rehabilitation Services  Office [phone]   Dewain Penning 04/14/2023, 12:29 PM

## 2023-04-14 NOTE — Plan of Care (Signed)
  Problem: Education: Goal: Knowledge of General Education information will improve Description: Including pain rating scale, medication(s)/side effects and non-pharmacologic comfort measures Outcome: Progressing   Problem: Health Behavior/Discharge Planning: Goal: Ability to manage health-related needs will improve Outcome: Progressing   Problem: Clinical Measurements: Goal: Ability to maintain clinical measurements within normal limits will improve Outcome: Progressing Goal: Will remain free from infection Outcome: Progressing Goal: Diagnostic test results will improve Outcome: Progressing Goal: Cardiovascular complication will be avoided Outcome: Progressing   Problem: Activity: Goal: Risk for activity intolerance will decrease Outcome: Progressing   Problem: Nutrition: Goal: Adequate nutrition will be maintained Outcome: Progressing   Problem: Coping: Goal: Level of anxiety will decrease Outcome: Progressing   Problem: Elimination: Goal: Will not experience complications related to bowel motility Outcome: Progressing Goal: Will not experience complications related to urinary retention Outcome: Progressing   Problem: Pain Managment: Goal: General experience of comfort will improve and/or be controlled Outcome: Progressing   Problem: Safety: Goal: Ability to remain free from injury will improve Outcome: Progressing   Problem: Skin Integrity: Goal: Risk for impaired skin integrity will decrease Outcome: Progressing   Problem: Education: Goal: Ability to describe self-care measures that may prevent or decrease complications (Diabetes Survival Skills Education) will improve Outcome: Progressing Goal: Individualized Educational Video(s) Outcome: Progressing   Problem: Coping: Goal: Ability to adjust to condition or change in health will improve Outcome: Progressing   Problem: Fluid Volume: Goal: Ability to maintain a balanced intake and output will  improve Outcome: Progressing   Problem: Health Behavior/Discharge Planning: Goal: Ability to identify and utilize available resources and services will improve Outcome: Progressing Goal: Ability to manage health-related needs will improve Outcome: Progressing   Problem: Metabolic: Goal: Ability to maintain appropriate glucose levels will improve Outcome: Progressing   Problem: Nutritional: Goal: Maintenance of adequate nutrition will improve Outcome: Progressing Goal: Progress toward achieving an optimal weight will improve Outcome: Progressing   Problem: Skin Integrity: Goal: Risk for impaired skin integrity will decrease Outcome: Progressing   Problem: Tissue Perfusion: Goal: Adequacy of tissue perfusion will improve Outcome: Progressing   Problem: Education: Goal: Understanding of CV disease, CV risk reduction, and recovery process will improve Outcome: Progressing Goal: Individualized Educational Video(s) Outcome: Progressing   Problem: Activity: Goal: Ability to return to baseline activity level will improve Outcome: Progressing   Problem: Cardiovascular: Goal: Ability to achieve and maintain adequate cardiovascular perfusion will improve Outcome: Progressing Goal: Vascular access site(s) Level 0-1 will be maintained Outcome: Progressing   Problem: Health Behavior/Discharge Planning: Goal: Ability to safely manage health-related needs after discharge will improve Outcome: Progressing

## 2023-04-14 NOTE — Progress Notes (Signed)
 Physical Therapy Treatment Patient Details Name: Harold [name] MRN: [id number] DOB: 03-25-1955 Today's Date: 04/14/2023   History of Present Illness 68 y.o. male presents to [facility] 03/27/23 with productive cough and generalized weakness. Admitted with influenza A and cellulitis of R foot s/p hardware removal of the R ankle and application of wound vac on 2/10. Prior admit 11/24 for R ankle ORIF and in 12/24 for revision. S/p excisional debridement of right ankle on 2/12. Pt underwent central line placement on 04/07/2023 however he has been having issues with recurrent bleeding from the insertion site which has finally stopped. Significant PMH: HTN, HLD, CAD s/p PCI, COPD, DM2, CKD stage IIIb, PVD, gout, OSA, neuropathy, COPD, a-fib, CAD s/p percutaneous coronary angioplasty, angina, obesity    PT Comments  The pt was able to make gradual progress towards his goals in order to d/c home as he was able to increase how far he was able to ambulate at a time this date. He was able to ambulate up to ~40 ft x2 bouts while utilizing a RW with CGA and a chair follow for safety. His BP did drop with transitioning supine to sit EOB but he denied symptoms. However, per the OT note today his BP did drop to 89/54(66) in standing with pt reporting dizziness. Discussed these concerns of orthostatic hypotension limiting pt's progress with therapy and impacting his safety with RN and MD. If the fluids the pt plans to receive does not improve his BP stability, maybe he could benefit from TED hose or an abdominal binder? The pt continues to display difficulty fully maintaining TDWB through his R leg with standing activity, reporting he had to place as much as 30% of his weight through his R leg to ambulate today. He also needs min-modA to power up to stand from low surfaces, partially due to his tall stature, but also likely due to muscular weakness. He does report having a lift chair at home though. Will continue to follow  acutely.  BP -  160/59 (87) supine 140/65 (86) sitting 125/112 (118) standing (pt moving arm, so likely inaccurate) 143/62 (87) sitting after first gait bout 140/61 (84) sitting after second gait bout     If plan is discharge home, recommend the following: A lot of help with walking and/or transfers;A lot of help with bathing/dressing/bathroom;Assistance with cooking/housework;Assist for transportation;Help with stairs or ramp for entrance   Can travel by private vehicle     Yes  Equipment Recommendations  BSC/3in1 (Bariatric; Pt has a tub bench, w/c, RW, and sliding board already)    Recommendations for Other Services       Precautions / Restrictions Precautions Precautions: Fall Recall of Precautions/Restrictions: Impaired Precaution/Restrictions Comments: Watch BP; wound vac RLE Required Braces or Orthoses: Other Brace Other Brace: R CAM boot for ambulation, R splint in bed Restrictions Weight Bearing Restrictions Per Provider Order: Yes RLE Weight Bearing Per Provider Order: Touchdown weight bearing (Per verbal order directly from Dr. Lajoyce Corners 04/03/23 - Ideally TDWB but doubt he can get around without full weight bearing)     Mobility  Bed Mobility Overal bed mobility: Needs Assistance Bed Mobility: Supine to Sit, Sit to Supine     Supine to sit: HOB elevated, Used rails, Supervision Sit to supine: Supervision, HOB elevated, Used rails   General bed mobility comments: Pt utilized rails, HOB elevated, supervision for safety    Transfers Overall transfer level: Needs assistance Equipment used: Rolling walker (2 wheels) Transfers: Sit to/from Stand Sit to  Stand: Min assist, Mod assist           General transfer comment: Pt needed min-modA to power up to stand from low EOB and from recliner seat. Good carryover noted with pt's placement of hands with transfers and with scooting to the edge prior to transferring.    Ambulation/Gait Ambulation/Gait assistance:  Contact guard assist, +2 safety/equipment (Chair Follow) Gait Distance (Feet): 40 Feet (x2 bouts of ~40 ft each bout) Assistive device: Rolling walker (2 wheels) Gait Pattern/deviations: Step-to pattern, Decreased stride length, Trunk flexed, Decreased weight shift to right, Decreased stance time - right, Decreased step length - left, Antalgic Gait velocity: reduced Gait velocity interpretation: <1.31 ft/sec, indicative of household ambulator   General Gait Details: Pt takes slow, small steps with noted decreased R weight shift and thus L step length. Cued pt to reduce weight bearing on R leg as much as able with pt reporting he felt he was doing that but still placing up to 30% of his weight through his R leg. Pt demonstrated good proximity to his RW when performing a 180' turn this date. CGA for safety, chair follow for safety   Stairs             Wheelchair Mobility     Tilt Bed    Modified Rankin (Stroke Patients Only)       Balance Overall balance assessment: Needs assistance Sitting-balance support: No upper extremity supported, Feet supported Sitting balance-Leahy Scale: Fair Sitting balance - Comments: statically   Standing balance support: Bilateral upper extremity supported, During functional activity, Reliant on assistive device for balance Standing balance-Leahy Scale: Poor Standing balance comment: Reliant on RW                            Communication Communication Communication: No apparent difficulties  Cognition Arousal: Alert Behavior During Therapy: WFL for tasks assessed/performed   PT - Cognitive impairments: No apparent impairments                         Following commands: Intact      Cueing Cueing Techniques: Verbal cues, Tactile cues  Exercises      General Comments General comments (skin integrity, edema, etc.): BP - 160/59 (87) supine, 140/65 (86) sitting, 125/112 (118) standing (pt moving arm, so likely  inaccurate), 143/62 (87) sitting after first gait bout, 140/61 (84) sitting after second gait bout      Pertinent Vitals/Pain Pain Assessment Pain Assessment: Faces Faces Pain Scale: Hurts a little bit Pain Location: R leg Pain Descriptors / Indicators: Discomfort, Guarding Pain Intervention(s): Limited activity within patient's tolerance, Monitored during session, Repositioned    Home Living                          Prior Function            PT Goals (current goals can now be found in the care plan section) Acute Rehab PT Goals Patient Stated Goal: Return home confidently PT Goal Formulation: With patient Time For Goal Achievement: 04/17/23 Potential to Achieve Goals: Good Progress towards PT goals: Progressing toward goals    Frequency    Min 1X/week      PT Plan      Co-evaluation              AM-PAC PT "6 Clicks" Mobility   Outcome Measure  Help needed  turning from your back to your side while in a flat bed without using bedrails?: A Little Help needed moving from lying on your back to sitting on the side of a flat bed without using bedrails?: A Little Help needed moving to and from a bed to a chair (including a wheelchair)?: A Little Help needed standing up from a chair using your arms (e.g., wheelchair or bedside chair)?: A Lot Help needed to walk in hospital room?: Total Help needed climbing 3-5 steps with a railing? : Total 6 Click Score: 13    End of Session Equipment Utilized During Treatment: Gait belt Activity Tolerance: Patient limited by fatigue Patient left: with call bell/phone within reach;in bed;with bed alarm set Nurse Communication: Mobility status;Other (comment) (BP) PT Visit Diagnosis: Other abnormalities of gait and mobility (R26.89);Unsteadiness on feet (R26.81);Muscle weakness (generalized) (M62.81);History of falling (Z91.81);Difficulty in walking, not elsewhere classified (R26.2)     Time: 5621-3086 PT Time  Calculation (min) (ACUTE ONLY): 34 min  Charges:    $Gait Training: 8-22 mins $Therapeutic Activity: 8-22 mins PT General Charges $$ ACUTE PT VISIT: 1 Visit                     Virgil Benedict, PT, DPT Acute Rehabilitation Services  Office: [phone]    Bettina Gavia 04/14/2023, 5:26 PM

## 2023-04-14 NOTE — Progress Notes (Signed)
 PHARMACY - ANTICOAGULATION CONSULT NOTE  Pharmacy Consult for heparin Indication: atrial fibrillation  Allergies  Allergen Reactions   Doxycycline Hives and Rash   Septra [Bactrim] Itching   Penicillins Rash    Allergy to All cillin drugs  Ancef given 9/24 with no obvious reaction   Metformin And Related Diarrhea and Nausea Only   Other Nausea And Vomiting    General Anesthesia - sometimes causes nausea and vomiting * OK to use scopolamine patch     Sulfamethoxazole-Trimethoprim Other (See Comments)   Wellbutrin [Bupropion]     Mood Changes    Patient Measurements: Height: 6\' 1"  (185.4 cm) Weight: 128.9 kg (284 lb 2.8 oz) IBW/kg (Calculated) : 79.9 Heparin Dosing Weight: 112.8 kg  Vital Signs: Temp: 98.6 F (37 C) (02/24 0354) Temp Source: Oral (02/24 0354) BP: 135/62 (02/24 0354) Pulse Rate: 58 (02/24 0354)  Labs: Recent Labs    04/12/23 0415 04/12/23 0415 04/12/23 2153 04/13/23 0815 04/13/23 1616 04/14/23 0423  HGB 9.4*  --   --  10.4*  --  9.8*  HCT 28.4*  --   --  29.8*  --  29.2*  PLT 198  --   --  193  --  183  APTT  --    < > 60* 93* 101* 72*  HEPARINUNFRC  --   --  >1.10* >1.10*  --  >1.10*  CREATININE 2.62*  --   --  2.85*  --  2.98*   < > = values in this interval not displayed.    Estimated Creatinine Clearance: 32.9 mL/min (A) (by C-G formula based on SCr of 2.98 mg/dL (H)).   Medical History: Past Medical History:  Diagnosis Date   Anemia    Anginal pain (HCC) 04/2013   Cardiac cath showed patent stents with distal LAD, circumflex-OM and RCA disease in small vessels.   Anxiety    Atrial fibrillation (HCC) 12/2021   CAD S/P percutaneous coronary angioplasty 2003, 04/2012   status post PCI to LAD, circumflex-OM 2, RCA   Carotid artery occlusion    Chronic renal insufficiency, stage II (mild)    Chronic venous insufficiency    varicosities, no reflux; dopplers 04/14/12- valvular insufficiency in the R and L GSV   Complication of anesthesia     COPD, mild (HCC)    Depression    situaltional    Diabetes mellitus    Diabetic neuropathy (HCC) 08/24/2019   Dyslipidemia associated with type 2 diabetes mellitus (HCC)    GERD (gastroesophageal reflux disease)    History of cardioversion 12/2021   at [facility]   HTN (hypertension)    Hypothyroidism    Neuromuscular disorder (HCC)    neuropathy in feet   Neuropathy    notably improved following PCI with improved cardiac function   Obesity    Obesity, Class II, BMI 35.0-39.9, with comorbidity (see actual BMI)    BMI 39; wgt loss efforts in place; seeing Dietician   Orthostatic hypotension    PONV (postoperative nausea and vomiting)    "Patch Works"   Pulmonary hypertension (HCC) 04/2012   PA pressure   Sleep apnea    uses nightly   Vitamin D deficiency    per facility notes    Medications:  Scheduled:   allopurinol  100 mg Oral Daily   amiodarone  200 mg Oral BID   amLODipine  5 mg Oral Daily   carvedilol  3.125 mg Oral BID WC   Chlorhexidine Gluconate Cloth  6  each Topical Q0600   clopidogrel  75 mg Oral Q breakfast   docusate sodium  100 mg Oral BID   DULoxetine  60 mg Oral Daily   ezetimibe  10 mg Oral Daily   gabapentin  400 mg Oral TID   guaiFENesin  600 mg Oral BID   insulin aspart  0-9 Units Subcutaneous Q6H   insulin glargine-yfgn  40 Units Subcutaneous Daily   levothyroxine  88 mcg Oral QAC breakfast   rosuvastatin  20 mg Oral QPM   sodium chloride flush  3 mL Intravenous Q12H   sodium chloride flush  3-10 mL Intravenous Q12H   Infusions:   ceFEPime (MAXIPIME) IV Stopped (04/13/23 2330)   DAPTOmycin 700 mg (04/13/23 1321)   heparin 1,650 Units/hr (04/13/23 2136)    Assessment: 68 y/o male with history of afib on apixaban. Now having significant bleeding from CVL site. Pharmacy consulted for heparin.   Eliquis has been held d/t bleeding from CVL site. Bleeding still occurring from site per RN. Per discussion with provider, would like to  keep pt on anticoagulation due to afib but would like heparin therapy for now to monitor bleeding. Last eliquis dose was 2/20 PM.   Heparin level >1.1 from DOAC use, aPTT therapeutic at 72 seconds, CBC ok.   Goal of Therapy:  Heparin level 0.3-0.7 units/ml aPTT 66-102 seconds Monitor platelets by anticoagulation protocol: Yes   Plan:  Continue heparin at 1700 units/hr Daily aPTT, heparin level, CBC  Fredonia Highland, PharmD, BCPS, [facility] Clinical Pharmacist Please check AMION for all [facility] Pharmacy numbers 04/14/2023

## 2023-04-14 NOTE — Progress Notes (Signed)
 Mobility Specialist Progress Note;   04/14/23 1415  Mobility  Activity Transferred from chair to bed  Level of Assistance Minimal assist, patient does 75% or more  Assistive Device Front wheel walker  Distance Ambulated (ft) 3 ft  RUE Weight Bearing Per Provider Order TDWB  RLE Weight Bearing Per Provider Order TWB  Activity Response Tolerated well  Mobility Referral Yes  Mobility visit 1 Mobility  Mobility Specialist Start Time (ACUTE ONLY) 1415  Mobility Specialist Stop Time (ACUTE ONLY) 1430  Mobility Specialist Time Calculation (min) (ACUTE ONLY) 15 min   Pt requesting assistance back to bed. Required MinA x2 trials to stand from recliner and safely pivot over back to bed. VSS throughout. No c/o dizziness during transfer. Pt left comfortably in bed with all needs met, alarm on.   Caesar Bookman Mobility Specialist Please contact via SecureChat or [employer] [phone]

## 2023-04-14 NOTE — Progress Notes (Signed)
 Progress Note   Patient: Harold [name] VWU:[id number] DOB: 22-Jul-1955 DOA: 03/27/2023     18 DOS: the patient was seen and examined on 04/14/2023   Brief hospital course: 68 y.o. male with PMH significant for morbid obesity, OSA, DM2, HTN, HLD, CAD/stent, A-fib on Eliquis, carotid artery stenosis, CKD, chronic anemia, anxiety/depression, diabetic neuropathy, GERD, hypothyroidism, COPD, pulmonary hypertension. 2/6, patient presented to the ED with complaint of cough, subjective fever, myalgia, generalized weakness for 5 days. Respiratory virus panel positive for influenza A. Chest x-ray negative for infiltrates. Out of for Tamiflu.  He improved from fluid with supportive care.   On admission, he was also noted to have right leg cellulitis. Per chart review, in November 2024, he underwent ORIF of the right ankle, with ensuing revision in December 2024.  He has subsequently been followed by wound care at home.  However over the course the last 1 week, patient had progressive right lateral malleolus erythema, swelling, tenderness, with some evidence of purulent drainage, X-ray showed soft tissue swelling most pronounced over the lateral malleolus, consistent with cellulitis, without evidence of subcutaneous gas or crepitus to suggest necrotizing fasciitis.  Orthopedics and vascular surgery consult appreciated.  Assessment and Plan: Right leg wound infection -Prior right ankle surgery (12/2022) complicated by postsurgical wound infection. S/p right ankle hardware removal, I&D and wound VAC placement -2/10 Dr. Jena Gauss In the setting of uncontrolled diabetes, and possible PAD.  S/p repeat debridement of the right ankle by Dr. Lajoyce Corners 04/02/2023.  Seen by ID [id number], antibiotics transitioned to cefepime and daptomycin.   Wound culture grew polymicrobial organisms.  ID consulted and recommended continuing current cefepime and daptomycin for 4 weeks since surgery EOT 05/12/2023 with weekly CK as outpatient..  Central line placed on 04/07/2023 with some post-procedural bleed noted.   Seen by PT OT, they recommended SNF, however patient will have to pay out-of-pocket for the co-pay which patient is unable to afford, ultimate decision to d/c home at time of d/c -Home setting was later found to be not safe. Per TOC, pt is now in [facility] program here   PAD Seen by vascular surgery.2/11, underwent aortogram, right leg angiogram, successful stenting of right superficial femoral artery.  Patient on Plavix and Eliquis which was resumed on 04/03/2023 after cleared by orthopedics. -With concerns of bleeding at central line site, pt is now on heparin gtt for now, tolerating with no evidence of acute blood loss   Acute diarrhea Resolved   Influenza A: Presented with 5 days of cough, fever, myalgia. Respiratory virus panel positive for influenza A. Chest x-ray without acute infiltrate. No fever, no WBC count Was outside the 48-hour window for Tamiflu and hence was not initiated.  Doing well on supportive care.   Type 2 diabetes mellitus / Diabetic neuropathy, POA: A1c 7 on 01/02/2023. PTA meds-Tresiba 60 units daily, Mounjaro Currently on Lantus 35 units daily and SSI/Accu-Cheks.  Blood sugar slightly elevated again.  Will increase Semglee to 40 units today.  PTA reportedly on gabapentin 1600 mg 3 times daily. Since dose was unable to be confirmed, pt was continued on 400 mg 3 times daily instead   Hypokalemia/hypomagnesemia: Improved   Paroxysmal A-fib Rates controlled on PTA meds Coreg, amiodarone  -CHADS-VASc of 4 -Eliquis held secondary to bleeding from CVL recently, now on heparin gtt with no acute bleed -If pt continues to not bleed, may consider re-trial of renally dosed eliquis in the next 24-48h   Essential hypertension PTA meds- Coreg 3.125 mg  twice daily, Lasix 160 mg daily, currently on home dose of Coreg with amlodipine 5 mg added -Lasix was increased from 40mg  bid to 80mg  bid on 2/14, however with   resultant increase of Cr up to 3.16. Lasix subsequently held -Cr has since improved and remain stable off diuretic   Anemia of chronic disease: Baseline hemoglobin between 10.5 11.5.   -Hgb remains stable. Bleeding recently noted at CVL site, eliquis held for now -Cont on heparin gtt for now, tolerating thus far without further bleed   CAD/stent Carotid artery stenosis HLD Continue PTA meds -Plavix, Crestor -Eliquis held for now, continue heparin gtt and monitor for bleed   Hypothyroidism Continue Synthroid   Morbid Obesity  Body mass index is 39.99 kg/m. Patient has been advised to make an attempt to improve diet and exercise patterns to aid in weight loss.   Obstructive sleep apnea Patient reported good compliance on home nocturnal CPAP   Gout Allopurinol, colchicine   Depression Cymbalta   AKI on CKD stage IIIb: Baseline creatinine appears to be 1.9-2.2.  Creatinine peaked to 3.14 that started to trend up after increasing lasix dose to 80mg  -Lasix remains held -Cr has improved with oral hydration, stable -Now orthostatic per below, will provide gentle fluids x 8h -Recheck bmet in AM  Orthostatic hypotension -SBP dropped to 80's while symptomatic with OT today -Pt's wt is currently below baseline wt after being aggressively diuresed -Will provide gentle IVF x 8hrs -Pt to cont to work with PT/OT   Subjective: Feeling generally weak  Physical Exam: Vitals:   04/14/23 0008 04/14/23 0354 04/14/23 0823 04/14/23 1301  BP: 130/65 135/62 (!) 149/61 120/76  Pulse: 61 (!) 58  (!) 57  Resp: 18 16  16   Temp: 98 F (36.7 C) 98.6 F (37 C)  97.8 F (36.6 C)  TempSrc: Oral Oral  Oral  SpO2: 96% 97%    Weight:  128.9 kg    Height:       General exam: Conversant, in no acute distress Respiratory system: normal chest rise, clear, no audible wheezing Cardiovascular system: regular rhythm, s1-s2 Gastrointestinal system: Nondistended, nontender, pos BS Central nervous  system: No seizures, no tremors Extremities: No cyanosis, no joint deformities Skin: No rashes, no pallor Psychiatry: Affect normal // no auditory hallucinations   Data Reviewed:  Labs reviewed: Na 138, K 3.7, Cr 2.98, WBC 6.4, Hgb 9.8, plts 183  Family Communication: Pt in room, family not at bedside  Disposition: Status is: Inpatient Remains inpatient appropriate because: Severity of illness  Planned Discharge Destination: Home    Author: Rickey Barbara, MD 04/14/2023 5:55 PM  For on call review [email or web address].

## 2023-04-15 ENCOUNTER — Other Ambulatory Visit (HOSPITAL_COMMUNITY): Payer: Self-pay

## 2023-04-15 DIAGNOSIS — J101 Influenza due to other identified influenza virus with other respiratory manifestations: Secondary | ICD-10-CM | POA: Diagnosis not present

## 2023-04-15 LAB — COMPREHENSIVE METABOLIC PANEL
ALT: 44 U/L (ref 0–44)
AST: 47 U/L — ABNORMAL HIGH (ref 15–41)
Albumin: 2.4 g/dL — ABNORMAL LOW (ref 3.5–5.0)
Alkaline Phosphatase: 82 U/L (ref 38–126)
Anion gap: 8 (ref 5–15)
BUN: 36 mg/dL — ABNORMAL HIGH (ref 8–23)
CO2: 21 mmol/L — ABNORMAL LOW (ref 22–32)
Calcium: 8.4 mg/dL — ABNORMAL LOW (ref 8.9–10.3)
Chloride: 108 mmol/L (ref 98–111)
Creatinine, Ser: 3.04 mg/dL — ABNORMAL HIGH (ref 0.61–1.24)
GFR, Estimated: 21 mL/min — ABNORMAL LOW (ref >60)
Glucose, Bld: 192 mg/dL — ABNORMAL HIGH (ref 70–99)
Potassium: 3.7 mmol/L (ref 3.5–5.1)
Sodium: 137 mmol/L (ref 135–145)
Total Bilirubin: 0.4 mg/dL (ref 0.0–1.2)
Total Protein: 5.7 g/dL — ABNORMAL LOW (ref 6.5–8.1)

## 2023-04-15 LAB — CBC
HCT: 26.4 % — ABNORMAL LOW (ref 39.0–52.0)
Hemoglobin: 9.1 g/dL — ABNORMAL LOW (ref 13.0–17.0)
MCH: 29.4 pg (ref 26.0–34.0)
MCHC: 34.5 g/dL (ref 30.0–36.0)
MCV: 85.4 fL (ref 80.0–100.0)
Platelets: 146 10*3/uL — ABNORMAL LOW (ref 150–400)
RBC: 3.09 MIL/uL — ABNORMAL LOW (ref 4.22–5.81)
RDW: 15.2 % (ref 11.5–15.5)
WBC: 6.3 10*3/uL (ref 4.0–10.5)
nRBC: 0 % (ref 0.0–0.2)

## 2023-04-15 LAB — GLUCOSE, CAPILLARY
Glucose-Capillary: 111 mg/dL — ABNORMAL HIGH (ref 70–99)
Glucose-Capillary: 165 mg/dL — ABNORMAL HIGH (ref 70–99)
Glucose-Capillary: 189 mg/dL — ABNORMAL HIGH (ref 70–99)
Glucose-Capillary: 224 mg/dL — ABNORMAL HIGH (ref 70–99)
Glucose-Capillary: 255 mg/dL — ABNORMAL HIGH (ref 70–99)

## 2023-04-15 LAB — CBC WITH DIFFERENTIAL/PLATELET
Abs Immature Granulocytes: 0.04 10*3/uL (ref 0.00–0.07)
Basophils Absolute: 0.1 10*3/uL (ref 0.0–0.1)
Basophils Relative: 1 %
Eosinophils Absolute: 0.3 10*3/uL (ref 0.0–0.5)
Eosinophils Relative: 4 %
HCT: 27 % — ABNORMAL LOW (ref 39.0–52.0)
Hemoglobin: 9.2 g/dL — ABNORMAL LOW (ref 13.0–17.0)
Immature Granulocytes: 1 %
Lymphocytes Relative: 11 %
Lymphs Abs: 0.9 10*3/uL (ref 0.7–4.0)
MCH: 29.4 pg (ref 26.0–34.0)
MCHC: 34.1 g/dL (ref 30.0–36.0)
MCV: 86.3 fL (ref 80.0–100.0)
Monocytes Absolute: 0.6 10*3/uL (ref 0.1–1.0)
Monocytes Relative: 8 %
Neutro Abs: 6 10*3/uL (ref 1.7–7.7)
Neutrophils Relative %: 75 %
Platelets: 168 10*3/uL (ref 150–400)
RBC: 3.13 MIL/uL — ABNORMAL LOW (ref 4.22–5.81)
RDW: 15.2 % (ref 11.5–15.5)
WBC: 8 10*3/uL (ref 4.0–10.5)
nRBC: 0 % (ref 0.0–0.2)

## 2023-04-15 LAB — HEPARIN LEVEL (UNFRACTIONATED): Heparin Unfractionated: 1.04 [IU]/mL — ABNORMAL HIGH (ref 0.30–0.70)

## 2023-04-15 LAB — URINALYSIS, COMPLETE (UACMP) WITH MICROSCOPIC
Bilirubin Urine: NEGATIVE
Glucose, UA: >=500 mg/dL — AB
Ketones, ur: NEGATIVE mg/dL
Leukocytes,Ua: NEGATIVE
Nitrite: NEGATIVE
Protein, ur: >=300 mg/dL — AB
Specific Gravity, Urine: 1.014 (ref 1.005–1.030)
pH: 5 (ref 5.0–8.0)

## 2023-04-15 LAB — CK: Total CK: 82 U/L (ref 49–397)

## 2023-04-15 LAB — APTT: aPTT: 82 s — ABNORMAL HIGH (ref 24–36)

## 2023-04-15 MED ORDER — ALBUMIN HUMAN 25 % IV SOLN
50.0000 g | Freq: Three times a day (TID) | INTRAVENOUS | Status: AC
  Administered 2023-04-15 – 2023-04-16 (×3): 50 g via INTRAVENOUS
  Filled 2023-04-15 (×3): qty 200

## 2023-04-15 NOTE — Plan of Care (Signed)
  Problem: Education: Goal: Knowledge of General Education information will improve Description: Including pain rating scale, medication(s)/side effects and non-pharmacologic comfort measures Outcome: Progressing   Problem: Clinical Measurements: Goal: Ability to maintain clinical measurements within normal limits will improve Outcome: Progressing Goal: Will remain free from infection Outcome: Progressing Goal: Diagnostic test results will improve Outcome: Progressing Goal: Cardiovascular complication will be avoided Outcome: Progressing   Problem: Activity: Goal: Risk for activity intolerance will decrease Outcome: Progressing   Problem: Nutrition: Goal: Adequate nutrition will be maintained Outcome: Progressing   Problem: Elimination: Goal: Will not experience complications related to bowel motility Outcome: Progressing

## 2023-04-15 NOTE — Progress Notes (Addendum)
 PHARMACY - ANTICOAGULATION CONSULT NOTE  Pharmacy Consult for heparin Indication: atrial fibrillation  Allergies  Allergen Reactions   Doxycycline Hives and Rash   Septra [Bactrim] Itching   Penicillins Rash    Allergy to All cillin drugs  Ancef given 9/24 with no obvious reaction   Metformin And Related Diarrhea and Nausea Only   Other Nausea And Vomiting    General Anesthesia - sometimes causes nausea and vomiting * OK to use scopolamine patch     Sulfamethoxazole-Trimethoprim Other (See Comments)   Wellbutrin [Bupropion]     Mood Changes    Patient Measurements: Height: 6\' 1"  (185.4 cm) Weight: 131 kg (288 lb 12.8 oz) IBW/kg (Calculated) : 79.9 Heparin Dosing Weight: 112.8 kg  Vital Signs: Temp: 98.5 F (36.9 C) (02/25 0324) Temp Source: Axillary (02/25 0324) BP: 164/55 (02/25 0324) Pulse Rate: 58 (02/25 0324)  Labs: Recent Labs    04/13/23 0815 04/13/23 1616 04/14/23 0423 04/15/23 0357  HGB 10.4*  --  9.8* 9.1*  HCT 29.8*  --  29.2* 26.4*  PLT 193  --  183 146*  APTT 93* 101* 72* 82*  HEPARINUNFRC >1.10*  --  >1.10* 1.04*  CREATININE 2.85*  --  2.98* 3.04*    Estimated Creatinine Clearance: 32.5 mL/min (A) (by C-G formula based on SCr of 3.04 mg/dL (H)).   Medical History: Past Medical History:  Diagnosis Date   Anemia    Anginal pain (HCC) 04/2013   Cardiac cath showed patent stents with distal LAD, circumflex-OM and RCA disease in small vessels.   Anxiety    Atrial fibrillation (HCC) 12/2021   CAD S/P percutaneous coronary angioplasty 2003, 04/2012   status post PCI to LAD, circumflex-OM 2, RCA   Carotid artery occlusion    Chronic renal insufficiency, stage II (mild)    Chronic venous insufficiency    varicosities, no reflux; dopplers 04/14/12- valvular insufficiency in the R and L GSV   Complication of anesthesia    COPD, mild (HCC)    Depression    situaltional    Diabetes mellitus    Diabetic neuropathy (HCC) 08/24/2019   Dyslipidemia  associated with type 2 diabetes mellitus (HCC)    GERD (gastroesophageal reflux disease)    History of cardioversion 12/2021   at [facility]   HTN (hypertension)    Hypothyroidism    Neuromuscular disorder (HCC)    neuropathy in feet   Neuropathy    notably improved following PCI with improved cardiac function   Obesity    Obesity, Class II, BMI 35.0-39.9, with comorbidity (see actual BMI)    BMI 39; wgt loss efforts in place; seeing Dietician   Orthostatic hypotension    PONV (postoperative nausea and vomiting)    "Patch Works"   Pulmonary hypertension (HCC) 04/2012   PA pressure   Sleep apnea    uses nightly   Vitamin D deficiency    per facility notes    Medications:  Scheduled:   allopurinol  100 mg Oral Daily   amiodarone  200 mg Oral BID   amLODipine  5 mg Oral Daily   carvedilol  3.125 mg Oral BID WC   Chlorhexidine Gluconate Cloth  6 each Topical Q0600   clopidogrel  75 mg Oral Q breakfast   docusate sodium  100 mg Oral BID   DULoxetine  60 mg Oral Daily   ezetimibe  10 mg Oral Daily   gabapentin  400 mg Oral TID   guaiFENesin  600 mg Oral  BID   insulin aspart  0-9 Units Subcutaneous Q6H   insulin glargine  40 Units Subcutaneous Daily   levothyroxine  88 mcg Oral QAC breakfast   rosuvastatin  20 mg Oral QPM   sodium chloride flush  3 mL Intravenous Q12H   sodium chloride flush  3-10 mL Intravenous Q12H   Infusions:   ceFEPime (MAXIPIME) IV 2 g (04/14/23 2140)   DAPTOmycin 700 mg (04/14/23 1331)   heparin 1,650 Units/hr (04/15/23 0442)    Assessment: 68 y/o male with history of afib on apixaban. Now having significant bleeding from CVL site. Pharmacy consulted for heparin.   Eliquis has been held d/t bleeding from CVL site. Bleeding still occurring from site per RN. Per discussion with provider, would like to keep pt on anticoagulation due to afib but would like heparin therapy for now to monitor bleeding. Last eliquis dose was 2/20 PM.   Heparin  level >1 from DOAC use, aPTT therapeutic at 82 seconds, H/H ok, pltc down slightly.   Goal of Therapy:  Heparin level 0.3-0.7 units/ml aPTT 66-102 seconds Monitor platelets by anticoagulation protocol: Yes   Plan:  Continue heparin at 1650 units/hr Daily aPTT, heparin level, CBC  Fredonia Highland, PharmD, BCPS, [facility] Clinical Pharmacist Please check AMION for all [facility] Pharmacy numbers 04/15/2023

## 2023-04-15 NOTE — Consult Note (Signed)
 Nephrology Consult   Reason for consult: AKI on CKD  Assessment/Recommendations:   AKI on CKD -Followed at our office for CKD care, baseline Cr in the 2s typically. Suspecting his current AKI is related to episode decreased renal perfusion in the context of orthostatic hypotension -will check CBC with diff to assess for AIN given that how long he's been on cefepime and dapto for. Will also check CK as well to rule out rhabdo -check urine sediment. Check renal u/s -if considering AIN then will need to consider renal biopsy -recommend checking echo to truly assess volume status -recommend holding diuretics for today -will start him on albumin q8h x 1 day and reassess -no indication for renal replacement therapy at this junction -Avoid nephrotoxic medications including NSAIDs and iodinated intravenous contrast exposure unless the latter is absolutely indicated.  Preferred narcotic agents for pain control are hydromorphone, fentanyl, and methadone. Morphine should not be used. Avoid Baclofen and avoid oral sodium phosphate and magnesium citrate based laxatives / bowel preps. Continue strict Input and Output monitoring. Will monitor the patient closely with you and intervene or adjust therapy as indicated by changes in clinical status/labs   Orthostatic hypotension H/o HTN -recommend compression stockings and abd binder for now. Albumin/fluids as above -will stop his amlodipine  Edema -volume status is not that bad today. Echo pending. Can continue to hold diuretics for now  RLE infection Hardware infection -s/p I&D, hardware removal 2/10, debridement 2/12 -on cefepime & dapto x 4 weeks  Afib -on hep gtt currently  DM2 -per primary  Anemia due to chronic disease -Transfuse for Hgb<7 g/dL  Acidosis -mild, likely related to AKI, monitor for now   Recommendations conveyed to primary service.    Anthony Sar [facility] 04/15/2023 12:40  PM   _____________________________________________________________________________________   History of Present Illness: Harold [name] is a/an 68 y.o. male with a past medical history of CKD, CAD, A-fib on Eliquis, carotid artery stenosis, PAD, DM, hypertension, GERD, hypothyroidism, COPD, pulmonary hypertension, obesity, OSA who presents to [facility] initially with influenza A, improved from a supportive care aspect.  Throughout the course of his stay, was found to have right leg cellulitis at his prior right ankle surgery.  Underwent ankle hardware removal, I&D, wound VAC placement on 2/10.  Underwent repeat debridement of the right ankle on 2/12.  He was transitioned to cefepime and daptomycin with a plan to continue for 4 weeks.  He also underwent aortogram, right leg angiogram with successful stenting of the right superficial femoral artery on 2/11.  He has been having issues with swelling and has been diuresed however, his creatinine had been rising.  His last dose of Lasix was on 2/18. There has also been concerns for orthostatic hypotension, has received some albumin in the interim.  Patient seen and examined bedside. Did PT earlier and was orthostatic again therefore unable to do PT. He otherwise denies any chest pain, SOB, worsening swelling, N/V, loss of appetite.   Medications:  Current Facility-Administered Medications  Medication Dose Route Frequency Provider Last Rate Last Admin   acetaminophen (TYLENOL) tablet 650 mg  650 mg Oral Q6H PRN Nadara Mustard, MD   650 mg at 04/06/23 2106   Or   acetaminophen (TYLENOL) suppository 650 mg  650 mg Rectal Q6H PRN Nadara Mustard, MD       allopurinol (ZYLOPRIM) tablet 100 mg  100 mg Oral Daily Nadara Mustard, MD   100 mg at 04/15/23 [phone]  amiodarone (PACERONE) tablet 200 mg  200 mg Oral BID Nadara Mustard, MD   200 mg at 04/15/23 0909   amLODipine (NORVASC) tablet 5 mg  5 mg Oral Daily Hughie Closs, MD   5 mg at 04/15/23 1610   benzonatate  (TESSALON) capsule 200 mg  200 mg Oral TID PRN Nadara Mustard, MD       carvedilol (COREG) tablet 3.125 mg  3.125 mg Oral BID WC Nadara Mustard, MD   3.125 mg at 04/15/23 9604   ceFEPIme (MAXIPIME) 2 g in sodium chloride 0.9 % 100 mL IVPB  2 g Intravenous Q12H Nadara Mustard, MD   Stopped at 04/15/23 [phone]   Chlorhexidine Gluconate Cloth 2 % PADS 6 each  6 each Topical Q0600 Hughie Closs, MD   6 each at 04/15/23 0900   clopidogrel (PLAVIX) tablet 75 mg  75 mg Oral Q breakfast Nadara Mustard, MD   75 mg at 04/15/23 0827   DAPTOmycin (CUBICIN) IVPB 700 mg/134mL premix  700 mg Intravenous Q1400 Judyann Munson, MD   Stopping previously hung infusion at 04/15/23 0808   docusate sodium (COLACE) capsule 100 mg  100 mg Oral BID Nadara Mustard, MD   100 mg at 04/15/23 8119   DULoxetine (CYMBALTA) DR capsule 60 mg  60 mg Oral Daily Nadara Mustard, MD   60 mg at 04/15/23 1478   ezetimibe (ZETIA) tablet 10 mg  10 mg Oral Daily Nadara Mustard, MD   10 mg at 04/15/23 2956   gabapentin (NEURONTIN) capsule 400 mg  400 mg Oral TID Nadara Mustard, MD   400 mg at 04/15/23 2130   guaiFENesin (MUCINEX) 12 hr tablet 600 mg  600 mg Oral BID Nadara Mustard, MD   600 mg at 04/15/23 0908   heparin ADULT infusion 100 units/mL (25000 units/252mL)  1,650 Units/hr Intravenous Continuous Jerald Kief, MD 16.5 mL/hr at 04/15/23 1200 1,650 Units/hr at 04/15/23 1200   hydrALAZINE (APRESOLINE) injection 10 mg  10 mg Intravenous Q6H PRN Hughie Closs, MD   10 mg at 04/10/23 8657   HYDROmorphone (DILAUDID) injection 0.5 mg  0.5 mg Intravenous Q4H PRN Nadara Mustard, MD   0.5 mg at 04/04/23 1508   insulin aspart (novoLOG) injection 0-9 Units  0-9 Units Subcutaneous Q6H Nadara Mustard, MD   2 Units at 04/15/23 8469   insulin glargine (LANTUS) injection 40 Units  40 Units Subcutaneous Daily Jerald Kief, MD   40 Units at 04/15/23 6295   levothyroxine (SYNTHROID) tablet 88 mcg  88 mcg Oral QAC breakfast Nadara Mustard, MD   88 mcg at  04/15/23 0445   melatonin tablet 3 mg  3 mg Oral QHS PRN Nadara Mustard, MD   3 mg at 04/14/23 2136   methocarbamol (ROBAXIN) tablet 500 mg  500 mg Oral Q6H PRN Nadara Mustard, MD   500 mg at 04/12/23 2115   Or   methocarbamol (ROBAXIN) injection 500 mg  500 mg Intravenous Q6H PRN Nadara Mustard, MD       metoCLOPramide (REGLAN) tablet 5-10 mg  5-10 mg Oral Q8H PRN Nadara Mustard, MD       Or   metoCLOPramide (REGLAN) injection 5-10 mg  5-10 mg Intravenous Q8H PRN Nadara Mustard, MD       naloxone [facility]) injection 0.4 mg  0.4 mg Intravenous PRN Nadara Mustard, MD       ondansetron [facility]) injection  4 mg  4 mg Intravenous Q6H PRN Nadara Mustard, MD       oxyCODONE (Oxy IR/ROXICODONE) immediate release tablet 5 mg  5 mg Oral Q4H PRN Nadara Mustard, MD   5 mg at 04/14/23 2136   polyethylene glycol (MIRALAX / GLYCOLAX) packet 17 g  17 g Oral Daily PRN Nadara Mustard, MD       rosuvastatin (CRESTOR) tablet 20 mg  20 mg Oral QPM Nadara Mustard, MD   20 mg at 04/14/23 1549   sodium chloride flush (NS) 0.9 % injection 3 mL  3 mL Intravenous Q12H Nadara Mustard, MD   3 mL at 04/14/23 0908   sodium chloride flush (NS) 0.9 % injection 3 mL  3 mL Intravenous PRN Nadara Mustard, MD   3 mL at 04/05/23 2245   sodium chloride flush (NS) 0.9 % injection 3-10 mL  3-10 mL Intravenous Q12H Nadara Mustard, MD   10 mL at 04/15/23 1017   sodium chloride flush (NS) 0.9 % injection 3-10 mL  3-10 mL Intravenous PRN Nadara Mustard, MD         ALLERGIES Doxycycline, Septra [bactrim], Penicillins, Metformin and related, Other, Sulfamethoxazole-trimethoprim, and Wellbutrin [bupropion]  MEDICAL HISTORY Past Medical History:  Diagnosis Date   Anemia    Anginal pain (HCC) 04/2013   Cardiac cath showed patent stents with distal LAD, circumflex-OM and RCA disease in small vessels.   Anxiety    Atrial fibrillation (HCC) 12/2021   CAD S/P percutaneous coronary angioplasty 2003, 04/2012   status post PCI to LAD,  circumflex-OM 2, RCA   Carotid artery occlusion    Chronic renal insufficiency, stage II (mild)    Chronic venous insufficiency    varicosities, no reflux; dopplers 04/14/12- valvular insufficiency in the R and L GSV   Complication of anesthesia    COPD, mild (HCC)    Depression    situaltional    Diabetes mellitus    Diabetic neuropathy (HCC) 08/24/2019   Dyslipidemia associated with type 2 diabetes mellitus (HCC)    GERD (gastroesophageal reflux disease)    History of cardioversion 12/2021   at [facility]   HTN (hypertension)    Hypothyroidism    Neuromuscular disorder (HCC)    neuropathy in feet   Neuropathy    notably improved following PCI with improved cardiac function   Obesity    Obesity, Class II, BMI 35.0-39.9, with comorbidity (see actual BMI)    BMI 39; wgt loss efforts in place; seeing Dietician   Orthostatic hypotension    PONV (postoperative nausea and vomiting)    "[employer]"   Pulmonary hypertension (HCC) 04/2012   PA pressure   Sleep apnea    uses nightly   Vitamin D deficiency    per facility notes     SOCIAL HISTORY Social History   Socioeconomic History   Marital status: Married    Spouse name: Not on file   Number of children: 0   Years of education: hs   Highest education level: Not on file  Occupational History   Occupation: Surveyor, quantity: replacement limited  Tobacco Use   Smoking status: Former    Current packs/day: 0.00    Average packs/day: 1 pack/day for 42.0 years (42.0 ttl pk-yrs)    Types: Cigarettes    Start date: 03/22/1961    Quit date: 03/23/2003    Years since quitting: 20.0   Smokeless tobacco: Never  Tobacco comments:    Former smoker 02/19/22  Vaping Use   Vaping status: Never Used  Substance and Sexual Activity   Alcohol use: No    Alcohol/week: 0.0 standard drinks of alcohol   Drug use: No   Sexual activity: Not on file  Other Topics Concern   Not on file  Social History Narrative    He and his partner [name] have now officially gotten married on 05/26/2013.  [name] is also a patient of our clinic.  He is now initially hyphenated his last name.   He has now " retired"from his long-term employment at [employer]. ->  As of September 2020   He does not smoke, he quit in 2009.  He does not drink alcohol.   He was adopted and no family history is known.    Social Drivers of Corporate investment banker Strain: Not on file  Food Insecurity: No Food Insecurity (03/27/2023)   Hunger Vital Sign    Worried About Running Out of Food in the Last Year: Never true    Ran Out of Food in the Last Year: Never true  Transportation Needs: No Transportation Needs (03/27/2023)   PRAPARE - Administrator, Civil Service (Medical): No    Lack of Transportation (Non-Medical): No  Physical Activity: Not on file  Stress: Not on file  Social Connections: Unknown (03/28/2023)   Social Connection and Isolation Panel [NHANES]    Frequency of Communication with Friends and Family: More than three times a week    Frequency of Social Gatherings with Friends and Family: More than three times a week    Attends Religious Services: Not on file    Active Member of Clubs or Organizations: Yes    Attends Banker Meetings: More than 4 times per year    Marital Status: Married  Catering manager Violence: Not At Risk (03/27/2023)   Humiliation, Afraid, Rape, and Kick questionnaire    Fear of Current or Ex-Partner: No    Emotionally Abused: No    Physically Abused: No    Sexually Abused: No     FAMILY HISTORY Family History  Adopted: Yes  Family history unknown: Yes     Review of Systems: 12 systems reviewed Otherwise as per HPI, all other systems reviewed and negative  Physical Exam: Vitals:   04/15/23 1000 04/15/23 1200  BP:  (!) 121/56  Pulse: (!) 57 (!) 50  Resp:  (!) 24  Temp:  (!) 97.1 F (36.2 C)  SpO2: 100% 96%   Total I/O In: 1992.1 [P.O.:240;  I.V.:1052.1; IV Piggyback:700] Out: 500 [Urine:500]  Intake/Output Summary (Last 24 hours) at 04/15/2023 1240 Last data filed at 04/15/2023 1200 Gross per 24 hour  Intake 1992.06 ml  Output 1100 ml  Net 892.06 ml   General: well-appearing, no acute distress, in recliner HEENT: anicteric sclera, oropharynx clear without lesions CV: regular rate, normal rhythm, no murmurs, no gallops, no rubs, no peripheral edema Lungs: clear to auscultation bilaterally, normal work of breathing Abd: soft, non-tender, non-distended, obese Skin: no visible lesions or rashes Psych: alert, engaged, appropriate mood and affect Musculoskeletal: trace edema LLE, LLE in boot Neuro: normal speech, no gross focal deficits   Test Results Reviewed Lab Results  Component Value Date   NA 137 04/15/2023   K 3.7 04/15/2023   CL 108 04/15/2023   CO2 21 (L) 04/15/2023   BUN 36 (H) 04/15/2023   CREATININE 3.04 (H) 04/15/2023   CALCIUM 8.4 (  L) 04/15/2023   ALBUMIN 2.4 (L) 04/15/2023   PHOS 3.4 01/09/2023     I have reviewed all relevant outside healthcare records related to the patient's kidney injury.

## 2023-04-15 NOTE — Care Management (Signed)
 Patient resting in bed. Breathing unlabored, symmetrical chest rise and fall.

## 2023-04-15 NOTE — TOC Progression Note (Signed)
 Transition of Care [facility]) - Progression Note    Patient Details  Name: Harold [name] MRN: [id number] Date of Birth: 08-02-1955  [facility] CM/SW Contact  Graves-Bigelow, Lamar Laundry, RN Phone Number: 04/15/2023, 3:41 PM  Clinical Narrative:  Patient discussed in progression rounds. Case Manager made [facility] aware that the patient is not medically ready for discharge at this time. Per MD, Nephrology to consult. Patient continue to work with PT/OT on the [employer]. Case Manager will continue to follow for additional transition of care needs as the patient progresses.    Expected Discharge Plan: Home w Home Health Services Barriers to Discharge: No Barriers Identified  Expected Discharge Plan and Services In-house Referral: Clinical Social Work Discharge Planning Services: CM Consult Post Acute Care Choice: Home Health Living arrangements for the past 2 months: Single Family Home Expected Discharge Date: 04/10/23               DME Arranged: IV pump/equipment DME Agency:  [facility]ianne Rice)       HH Arranged: RN, Disease Management, PT, OT, Nurse's Aide HH Agency: [facility])  Social Determinants of Health (SDOH) Interventions SDOH Screenings   Food Insecurity: No Food Insecurity (03/27/2023)  Housing: Low Risk  (03/28/2023)  Transportation Needs: No Transportation Needs (03/27/2023)  Utilities: Not At Risk (03/28/2023)  Social Connections: Unknown (03/28/2023)  Tobacco Use: Medium Risk (04/02/2023)    Readmission Risk Interventions     No data to display

## 2023-04-15 NOTE — Progress Notes (Signed)
 Progress Note   Patient: Harold [name] VWU:[id number] DOB: [birth year] DOA: 03/27/2023     19 DOS: the patient was seen and examined on 04/15/2023   Brief hospital course: 68 y.o. male with PMH significant for morbid obesity, OSA, DM2, HTN, HLD, CAD/stent, A-fib on Eliquis, carotid artery stenosis, CKD, chronic anemia, anxiety/depression, diabetic neuropathy, GERD, hypothyroidism, COPD, pulmonary hypertension. 2/6, patient presented to the ED with complaint of cough, subjective fever, myalgia, generalized weakness for 5 days. Respiratory virus panel positive for influenza A. Chest x-ray negative for infiltrates. Out of for Tamiflu.  He improved from fluid with supportive care.   On admission, he was also noted to have right leg cellulitis. Per chart review, in November 2024, he underwent ORIF of the right ankle, with ensuing revision in December 2024.  He has subsequently been followed by wound care at home.  However over the course the last 1 week, patient had progressive right lateral malleolus erythema, swelling, tenderness, with some evidence of purulent drainage, X-ray showed soft tissue swelling most pronounced over the lateral malleolus, consistent with cellulitis, without evidence of subcutaneous gas or crepitus to suggest necrotizing fasciitis.  Orthopedics and vascular surgery consult appreciated.  Assessment and Plan: Right leg wound infection -Prior right ankle surgery (12/2022) complicated by postsurgical wound infection. -S/p right ankle hardware removal, I&D and wound VAC placement on 2/10 by Dr. Jena Gauss -S/p repeat debridement of the right ankle by Dr. Lajoyce Corners 04/02/2023.   -ID consulted and recommended continuing current cefepime and daptomycin for 4 weeks since surgery EOT 05/12/2023 with weekly CK as outpatient..  -Central line placed on 04/07/2023 with some post-procedural bleed noted, but now stable on heparin gtt, anticipate transition to eliquis once renal function  stabilizes, see below -Therapy recs for SNF, however patient will have to pay out-of-pocket for the co-pay which patient is unable to afford, therefore pt to continue with [facility] program prior to d/c   PAD -Seen by vascular surgery.2/11, underwent aortogram, right leg angiogram, successful stenting of right superficial femoral artery.   -Patient on Plavix and heparin gtt for now -With concerns of bleeding at central line site, pt is now on heparin gtt for now. Would transition to eliquis once renal function stabilizes, per below   Acute diarrhea Resolved   Influenza A: Presented with 5 days of cough, fever, myalgia. Respiratory virus panel positive for influenza A. Chest x-ray without acute infiltrate. No fever, no WBC count Was outside the 48-hour window for Tamiflu and hence was not initiated.    Type 2 diabetes mellitus / Diabetic neuropathy, POA: A1c 7 on 01/02/2023. PTA meds-Tresiba 60 units daily, Mounjaro -Currently on Semglee to 40 units daily -PTA reportedly on gabapentin 1600 mg 3 times daily. Since dose was unable to be confirmed, pt was continued on 400 mg 3 times daily instead   Hypokalemia/hypomagnesemia: Improved   Paroxysmal A-fib Rates controlled on PTA meds Coreg, amiodarone  -CHADS-VASc of 4 -Eliquis held secondary to bleeding from CVL recently, now on heparin gtt with no acute bleed -If no further bleed from CVL and when renal function stabilizes, would transition to eliquis at that time   Essential hypertension -PTA meds- Coreg 3.125 mg twice daily, Lasix 160 mg daily, currently on home dose of Coreg with amlodipine 5 mg added -Lasix was increased from 40mg  bid to 80mg  bid on 2/14, however with  resultant increase of Cr up to 3.16. Lasix subsequently stopped   Anemia of chronic disease: Baseline hemoglobin between 10.5 11.5.   -  Hgb remains stable. Bleeding recently noted at CVL site, eliquis held for now -Cont on heparin gtt for now, tolerating thus far without  further bleed   CAD/stent Carotid artery stenosis HLD Continue PTA meds -Plavix, Crestor -Eliquis held for now, continue heparin gtt and monitor for bleed   Hypothyroidism Continue Synthroid   Morbid Obesity  Body mass index is 39.99 kg/m. Patient has been advised to make an attempt to improve diet and exercise patterns to aid in weight loss.   Obstructive sleep apnea Patient reported good compliance on home nocturnal CPAP   Gout Allopurinol, colchicine   Depression Cymbalta   AKI on CKD stage IIIb:  -Baseline creatinine appears to be 1.9-2.2.   -Creatinine peaked to 3.14 that started to trend up after increasing lasix dose to 80mg  -Lasix now on hold -Cr stabilized with gentle hydration, however has risen to over 3 this AM -Pt remains orthostatic -Have consulted Nephrology to assist with volume management. Recs for albumin q8h noted. Cont to hold diuretic -Recheck bmet in AM  Orthostatic hypotension -Noted to be orthostatic when working with PT, see documentation -Given brief gentle fluids, remains orthostatic -2d echo ordered, pending -Will try abdominal binder when working with PT   Subjective: Felt dizzy when working with PT this AM  Physical Exam: Vitals:   04/15/23 1400 04/15/23 1500 04/15/23 1518 04/15/23 1600  BP:   (!) 141/61   Pulse: (!) 58 (!) 58  (!) 58  Resp:   (!) 24   Temp:   (!) 97.5 F (36.4 C)   TempSrc:   Axillary   SpO2: 100% 99% 100% 98%  Weight:      Height:       General exam: Awake, laying in bed, in nad Respiratory system: Normal respiratory effort, no wheezing Cardiovascular system: regular rate, s1, s2 Gastrointestinal system: Soft, nondistended, positive BS Central nervous system: CN2-12 grossly intact, strength intact Extremities: Perfused, no clubbing Skin: Normal skin turgor, no notable skin lesions seen Psychiatry: Mood normal // no visual hallucinations   Data Reviewed:  Labs reviewed: Na 137, K 3.7, Cr 3.04, WBC 8.0,  Hgb 9.2, Plts 168  Family Communication: Pt in room, family not at bedside  Disposition: Status is: Inpatient Remains inpatient appropriate because: Severity of illness  Planned Discharge Destination: Home    Author: Rickey Barbara, MD 04/15/2023 4:25 PM  For on call review [email or web address].

## 2023-04-15 NOTE — Progress Notes (Signed)
 Physical Therapy Treatment Patient Details Name: Harold [name] MRN: [id number] DOB: 03-19-1955 Today's Date: 04/15/2023   History of Present Illness 68 y.o. male presents to [facility] 03/27/23 with productive cough and generalized weakness. Admitted with influenza A and cellulitis of R foot s/p hardware removal of the R ankle and application of wound vac on 2/10. Prior admit 11/24 for R ankle ORIF and in 12/24 for revision. S/p excisional debridement of right ankle on 2/12. Pt underwent central line placement on 04/07/2023 however he has been having issues with recurrent bleeding from the insertion site which has finally stopped. Significant PMH: HTN, HLD, CAD s/p PCI, COPD, DM2, CKD stage IIIb, PVD, gout, OSA, neuropathy, COPD, a-fib, CAD s/p percutaneous coronary angioplasty, angina, obesity    PT Comments  STAR PT/OT Session: Introduced [employer] and asked for consistent participation with goal of going home with SO. Pt has ramp into home, and utilizes transport chair for getting in and out of home. Pt has wheelchair which he uses in his bed room but it does not fit through hallways. Pt reports needing to be able to ambulate 40 feet with RW to navigate through his home. OT assisted pt with getting dressed at [facility] and pt requiring total A for changing splint to CAM boot. Pt is contact guard for bed mobility and min A for standing and pivoting to recliner. Pt is orthostatic and unable to progress mobility further today. Orthostatic BP below.   Orthostatic BPs  Supine 141/62 (84)  Sitting 111/58 (73)  Sitting after 3 min 112//62 (73  Standing (knee buckling and tremors)  83/57 (67)  Return to seated  120/60 (75)  STS with immediate pivot to standing in front of recliner  106/50 (61)  Sitting in recliner 121/56 (75)      If plan is discharge home, recommend the following: A lot of help with walking and/or transfers;A lot of help with bathing/dressing/bathroom;Assistance with  cooking/housework;Assist for transportation;Help with stairs or ramp for entrance   Can travel by private vehicle     Yes  Equipment Recommendations  BSC/3in1 (Bariatric; Pt has a tub bench, w/c, RW, and sliding board already)       Precautions / Restrictions Precautions Precautions: Fall Recall of Precautions/Restrictions: Impaired Precaution/Restrictions Comments: Watch BP; wound vac RLE Required Braces or Orthoses: Other Brace Other Brace: R CAM boot for ambulation, R splint in bed Restrictions Weight Bearing Restrictions Per Provider Order: Yes RLE Weight Bearing Per Provider Order: Touchdown weight bearing Other Position/Activity Restrictions: per Dr. Audrie Lia note 04/03/2023     Mobility  Bed Mobility Overal bed mobility: Needs Assistance Bed Mobility: Supine to Sit     Supine to sit: HOB elevated, Used rails, Contact guard     General bed mobility comments: contact guard for coming to side of bed with use of bed rails    Transfers Overall transfer level: Needs assistance Equipment used: Rolling walker (2 wheels) Transfers: Sit to/from Stand Sit to Stand: Min assist, From elevated surface Stand pivot transfers: Min assist         General transfer comment: min A for STS to RW, cues for decreased weightbearing through R LE, and increased use of UE, pt with poor carryover, BP dropped pt with tremors and sat back down, on second attempt with min Ax1 pt stands and quickly pivots to recliner before BP bottoms out. multimodal cues for proximity to RW in order to weightbear through UE to offweight R LE, pt reports he is not putting  all his weight through his R LE    Ambulation/Gait               General Gait Details: deferred due to soft BP       Balance Overall balance assessment: Needs assistance Sitting-balance support: No upper extremity supported, Feet supported Sitting balance-Leahy Scale: Fair Sitting balance - Comments: statically Postural control:  Posterior lean Standing balance support: Bilateral upper extremity supported, During functional activity, Reliant on assistive device for balance Standing balance-Leahy Scale: Poor Standing balance comment: Reliant on RW                            Communication Communication Communication: No apparent difficulties;Impaired Factors Affecting Communication: Reduced clarity of speech (with low BP)  Cognition Arousal: Alert Behavior During Therapy: WFL for tasks assessed/performed   PT - Cognitive impairments: Memory, Safety/Judgement, Problem solving                       PT - Cognition Comments: deficits with memory, safety awareness especially in terms of R LE weightbearing restrictions, and word finding, clarity of speech Following commands: Intact      Cueing Cueing Techniques: Verbal cues, Tactile cues     General Comments General comments (skin integrity, edema, etc.): see BP in clinical impression      Pertinent Vitals/Pain Pain Assessment Pain Assessment: Faces Faces Pain Scale: Hurts a little bit Pain Location: R leg Pain Descriptors / Indicators: Discomfort, Guarding Pain Intervention(s): Limited activity within patient's tolerance, Monitored during session, Repositioned     PT Goals (current goals can now be found in the care plan section) Acute Rehab PT Goals Patient Stated Goal: Return home confidently PT Goal Formulation: With patient Time For Goal Achievement: 04/17/23 Potential to Achieve Goals: Good Progress towards PT goals: Not progressing toward goals - comment    Frequency    Min 1X/week           Co-evaluation PT/OT/SLP Co-Evaluation/Treatment: Yes Reason for Co-Treatment: To address functional/ADL transfers;For patient/therapist safety;Other (comment) (act tolerance) PT goals addressed during session: Mobility/safety with mobility;Balance;Proper use of DME OT goals addressed during session: ADL's and self-care       AM-PAC PT "6 Clicks" Mobility   Outcome Measure  Help needed turning from your back to your side while in a flat bed without using bedrails?: A Little Help needed moving from lying on your back to sitting on the side of a flat bed without using bedrails?: A Little Help needed moving to and from a bed to a chair (including a wheelchair)?: A Little Help needed standing up from a chair using your arms (e.g., wheelchair or bedside chair)?: A Lot Help needed to walk in hospital room?: Total Help needed climbing 3-5 steps with a railing? : Total 6 Click Score: 13    End of Session Equipment Utilized During Treatment: Gait belt Activity Tolerance: Patient limited by fatigue (low BP) Patient left: with call bell/phone within reach;in bed;with bed alarm set Nurse Communication: Mobility status;Other (comment) (BP) PT Visit Diagnosis: Other abnormalities of gait and mobility (R26.89);Unsteadiness on feet (R26.81);Muscle weakness (generalized) (M62.81);History of falling (Z91.81);Difficulty in walking, not elsewhere classified (R26.2)     Time: 1115-1206 PT Time Calculation (min) (ACUTE ONLY): 51 min  Charges:    $Therapeutic Activity: 8-22 mins PT General Charges $$ ACUTE PT VISIT: 1 Visit  Clavin Ruhlman B. Beverely Risen PT, DPT Acute Rehabilitation Services Please use secure chat or  Call Office [phone]    Elon Alas [facility] 04/15/2023, 1:06 PM

## 2023-04-15 NOTE — Progress Notes (Signed)
 Occupational Therapy Treatment Patient Details Name: Harold [name] MRN: [id number] DOB: 1955-04-01 Today's Date: 04/15/2023   History of present illness 68 y.o. male presents to [facility] 03/27/23 with productive cough and generalized weakness. Admitted with influenza A and cellulitis of R foot s/p hardware removal of the R ankle and application of wound vac on 2/10. Prior admit 11/24 for R ankle ORIF and in 12/24 for revision. S/p excisional debridement of right ankle on 2/12. Pt underwent central line placement on 04/07/2023 however he has been having issues with recurrent bleeding from the insertion site which has finally stopped. Significant PMH: HTN, HLD, CAD s/p PCI, COPD, DM2, CKD stage IIIb, PVD, gout, OSA, neuropathy, COPD, a-fib, CAD s/p percutaneous coronary angioplasty, angina, obesity   OT comments  STAR program OT/PT session: Patient received in supine and explained the STAR program and patient agreeable to participate. BP checked while in supine with 141/62(84). Patient was CGA to get to EOB and BP checked with 111/58. Patient required max assist to donn clothing over feet and to doff splint and donn RLE CAM boot. Patient stood to pull up clothing with BP 83/57(67). Patient returned to EOB and BP checked following 3-5 minutes with 120/60(75) BP. Patient performed transfer to recliner and BP checked before sitting with 106/50(61). After short rest BP seated was 121/56(75). Patient to continue to be followed by STAR program to address established goals to facilitate safe DC home.       If plan is discharge home, recommend the following:  A little help with walking and/or transfers;A lot of help with bathing/dressing/bathroom;Assistance with cooking/housework;Assist for transportation;Help with stairs or ramp for entrance   Equipment Recommendations  Other (comment) (bariatric BSC)    Recommendations for Other Services      Precautions / Restrictions Precautions Precautions:  Fall Recall of Precautions/Restrictions: Impaired Precaution/Restrictions Comments: Watch BP; wound vac RLE Required Braces or Orthoses: Other Brace Other Brace: R CAM boot for ambulation, R splint in bed Restrictions Weight Bearing Restrictions Per Provider Order: Yes RLE Weight Bearing Per Provider Order: Touchdown weight bearing Other Position/Activity Restrictions: per Dr. Audrie Lia note 04/03/2023       Mobility Bed Mobility Overal bed mobility: Needs Assistance Bed Mobility: Supine to Sit     Supine to sit: HOB elevated, Used rails, Contact guard     General bed mobility comments: contact guard for coming to side of bed with use of bed rails    Transfers Overall transfer level: Needs assistance Equipment used: Rolling walker (2 wheels) Transfers: Sit to/from Stand Sit to Stand: Min assist, From elevated surface Stand pivot transfers: Min assist         General transfer comment: min A for STS to RW, cues for decreased weightbearing through R LE, and increased use of UE, pt with poor carryover, BP dropped pt with tremors and sat back down, on second attempt with min Ax1 pt stands and quickly pivots to recliner before BP bottoms out. multimodal cues for proximity to RW in order to weightbear through UE to offweight R LE, pt reports he is not putting all his weight through his R LE     Balance Overall balance assessment: Needs assistance Sitting-balance support: No upper extremity supported, Feet supported Sitting balance-Leahy Scale: Fair Sitting balance - Comments: statically Postural control: Posterior lean Standing balance support: Bilateral upper extremity supported, During functional activity, Reliant on assistive device for balance Standing balance-Leahy Scale: Poor Standing balance comment: Reliant on RW  ADL either performed or assessed with clinical judgement   ADL Overall ADL's : Needs assistance/impaired Eating/Feeding:  Set up               Upper Body Dressing : Set up;Sitting Upper Body Dressing Details (indicate cue type and reason): gown Lower Body Dressing: Maximal assistance;Sit to/from stand Lower Body Dressing Details (indicate cue type and reason): donned pullup brief, boxers, pants, and CAM boot with patient requiring assistance to get items over feet and to pull up while standing               General ADL Comments: Patient states his husband assists with LB dressing    Extremity/Trunk Assessment Upper Extremity Assessment Upper Extremity Assessment: Overall WFL for tasks assessed            Vision       Perception     Praxis     Communication Communication Communication: No apparent difficulties;Impaired Factors Affecting Communication: Reduced clarity of speech (with low BP)   Cognition Arousal: Alert Behavior During Therapy: WFL for tasks assessed/performed                                 Following commands: Intact        Cueing   Cueing Techniques: Verbal cues, Tactile cues  Exercises      Shoulder Instructions       General Comments see BP in clinical impression    Pertinent Vitals/ Pain       Pain Assessment Pain Assessment: Faces Faces Pain Scale: Hurts a little bit Pain Location: R leg Pain Descriptors / Indicators: Discomfort, Guarding Pain Intervention(s): Limited activity within patient's tolerance, Monitored during session, Repositioned  Home Living                                          Prior Functioning/Environment              Frequency  Min 1X/week        Progress Toward Goals  OT Goals(current goals can now be found in the care plan section)  Progress towards OT goals: Progressing toward goals  Acute Rehab OT Goals Patient Stated Goal: to go home OT Goal Formulation: With patient Time For Goal Achievement: 04/15/23 Potential to Achieve Goals: Good ADL Goals Pt Will Perform  Grooming: with set-up;sitting Pt Will Perform Lower Body Dressing: with min assist;with adaptive equipment;with caregiver independent in assisting;sitting/lateral leans;sit to/from stand Pt Will Transfer to Toilet: stand pivot transfer;with min assist;bedside commode Pt Will Perform Toileting - Clothing Manipulation and hygiene: with mod assist;sitting/lateral leans;sit to/from stand  Plan      Co-evaluation    PT/OT/SLP Co-Evaluation/Treatment: Yes Reason for Co-Treatment: To address functional/ADL transfers;For patient/therapist safety;Other (comment) (activity tolerance) PT goals addressed during session: Mobility/safety with mobility;Balance;Proper use of DME OT goals addressed during session: ADL's and self-care      AM-PAC OT "6 Clicks" Daily Activity     Outcome Measure   Help from another person eating meals?: None Help from another person taking care of personal grooming?: None Help from another person toileting, which includes using toliet, bedpan, or urinal?: A Lot Help from another person bathing (including washing, rinsing, drying)?: A Lot Help from another person to put on and taking off regular upper body clothing?: A Little Help  from another person to put on and taking off regular lower body clothing?: A Lot 6 Click Score: 17    End of Session Equipment Utilized During Treatment: Gait belt;Rolling walker (2 wheels)  OT Visit Diagnosis: Unsteadiness on feet (R26.81);Other abnormalities of gait and mobility (R26.89);Muscle weakness (generalized) (M62.81);Pain Pain - Right/Left: Right Pain - part of body: Ankle and joints of foot   Activity Tolerance Patient tolerated treatment well;Other (comment) (soft BP)   Patient Left in chair;with call bell/phone within reach;with chair alarm set   Nurse Communication Mobility status        Time: 1308-6578 OT Time Calculation (min): 48 min  Charges: OT General Charges $OT Visit: 1 Visit OT Treatments $Self Care/Home  Management : 23-37 mins  Alfonse Flavors, OTA Acute Rehabilitation Services  Office [phone]   Dewain Penning 04/15/2023, 3:01 PM

## 2023-04-16 ENCOUNTER — Other Ambulatory Visit (HOSPITAL_COMMUNITY): Payer: Self-pay

## 2023-04-16 ENCOUNTER — Inpatient Hospital Stay (HOSPITAL_COMMUNITY): Payer: Medicare Other

## 2023-04-16 DIAGNOSIS — I5031 Acute diastolic (congestive) heart failure: Secondary | ICD-10-CM

## 2023-04-16 DIAGNOSIS — N179 Acute kidney failure, unspecified: Secondary | ICD-10-CM | POA: Diagnosis not present

## 2023-04-16 DIAGNOSIS — J101 Influenza due to other identified influenza virus with other respiratory manifestations: Secondary | ICD-10-CM | POA: Diagnosis not present

## 2023-04-16 DIAGNOSIS — L97314 Non-pressure chronic ulcer of right ankle with necrosis of bone: Secondary | ICD-10-CM | POA: Diagnosis not present

## 2023-04-16 DIAGNOSIS — L02415 Cutaneous abscess of right lower limb: Secondary | ICD-10-CM | POA: Diagnosis not present

## 2023-04-16 LAB — PROTEIN / CREATININE RATIO, URINE
Creatinine, Urine: 71 mg/dL
Protein Creatinine Ratio: 9.61 mg/mg{creat} — ABNORMAL HIGH (ref 0.00–0.15)
Total Protein, Urine: 682 mg/dL

## 2023-04-16 LAB — CBC
HCT: 24 % — ABNORMAL LOW (ref 39.0–52.0)
Hemoglobin: 8 g/dL — ABNORMAL LOW (ref 13.0–17.0)
MCH: 29.5 pg (ref 26.0–34.0)
MCHC: 33.3 g/dL (ref 30.0–36.0)
MCV: 88.6 fL (ref 80.0–100.0)
Platelets: 121 10*3/uL — ABNORMAL LOW (ref 150–400)
RBC: 2.71 MIL/uL — ABNORMAL LOW (ref 4.22–5.81)
RDW: 15.6 % — ABNORMAL HIGH (ref 11.5–15.5)
WBC: 5.3 10*3/uL (ref 4.0–10.5)
nRBC: 0 % (ref 0.0–0.2)

## 2023-04-16 LAB — COMPREHENSIVE METABOLIC PANEL
ALT: 42 U/L (ref 0–44)
AST: 50 U/L — ABNORMAL HIGH (ref 15–41)
Albumin: 3.3 g/dL — ABNORMAL LOW (ref 3.5–5.0)
Alkaline Phosphatase: 73 U/L (ref 38–126)
Anion gap: 9 (ref 5–15)
BUN: 36 mg/dL — ABNORMAL HIGH (ref 8–23)
CO2: 24 mmol/L (ref 22–32)
Calcium: 8.9 mg/dL (ref 8.9–10.3)
Chloride: 107 mmol/L (ref 98–111)
Creatinine, Ser: 3.2 mg/dL — ABNORMAL HIGH (ref 0.61–1.24)
GFR, Estimated: 20 mL/min — ABNORMAL LOW (ref >60)
Glucose, Bld: 143 mg/dL — ABNORMAL HIGH (ref 70–99)
Potassium: 3.5 mmol/L (ref 3.5–5.1)
Sodium: 140 mmol/L (ref 135–145)
Total Bilirubin: 0.6 mg/dL (ref 0.0–1.2)
Total Protein: 5.8 g/dL — ABNORMAL LOW (ref 6.5–8.1)

## 2023-04-16 LAB — GLUCOSE, CAPILLARY
Glucose-Capillary: 104 mg/dL — ABNORMAL HIGH (ref 70–99)
Glucose-Capillary: 120 mg/dL — ABNORMAL HIGH (ref 70–99)
Glucose-Capillary: 136 mg/dL — ABNORMAL HIGH (ref 70–99)
Glucose-Capillary: 155 mg/dL — ABNORMAL HIGH (ref 70–99)

## 2023-04-16 LAB — ECHOCARDIOGRAM COMPLETE
Area-P 1/2: 3.72 cm2
Height: 73 in
S' Lateral: 4.8 cm
Weight: 4779.57 [oz_av]

## 2023-04-16 LAB — MAGNESIUM: Magnesium: 2 mg/dL (ref 1.7–2.4)

## 2023-04-16 LAB — HEPARIN LEVEL (UNFRACTIONATED): Heparin Unfractionated: 0.88 [IU]/mL — ABNORMAL HIGH (ref 0.30–0.70)

## 2023-04-16 LAB — APTT: aPTT: 71 s — ABNORMAL HIGH (ref 24–36)

## 2023-04-16 MED ORDER — ALBUMIN HUMAN 25 % IV SOLN
50.0000 g | Freq: Three times a day (TID) | INTRAVENOUS | Status: AC
  Administered 2023-04-16 – 2023-04-17 (×3): 50 g via INTRAVENOUS
  Filled 2023-04-16 (×3): qty 200

## 2023-04-16 NOTE — Plan of Care (Signed)
  Problem: Clinical Measurements: Goal: Cardiovascular complication will be avoided Outcome: Progressing   Problem: Nutrition: Goal: Adequate nutrition will be maintained Outcome: Progressing   Problem: Coping: Goal: Level of anxiety will decrease Outcome: Progressing   Problem: Elimination: Goal: Will not experience complications related to bowel motility Outcome: Progressing Goal: Will not experience complications related to urinary retention Outcome: Progressing   Problem: Pain Managment: Goal: General experience of comfort will improve and/or be controlled Outcome: Progressing   Problem: Safety: Goal: Ability to remain free from injury will improve Outcome: Progressing   Problem: Metabolic: Goal: Ability to maintain appropriate glucose levels will improve Outcome: Progressing   Problem: Nutritional: Goal: Maintenance of adequate nutrition will improve Outcome: Progressing   Problem: Tissue Perfusion: Goal: Adequacy of tissue perfusion will improve Outcome: Progressing

## 2023-04-16 NOTE — Progress Notes (Signed)
  Echocardiogram 2D Echocardiogram has been performed.  Delcie Roch 04/16/2023, 12:06 PM

## 2023-04-16 NOTE — Progress Notes (Signed)
 [facility]                                                                              Harold [name], is a 68 y.o. male, DOB - [birth year], ZOX:[id number] Admit date - 03/27/2023    Outpatient Primary MD for the patient is [name], MD  LOS - 20  days  Chief Complaint  Patient presents with   Cough       Brief summary   Patient is a 68 year old male with morbid obesity, OSA, DM2, HTN, HLD, CAD/stent, A-fib on Eliquis, carotid artery stenosis, CKD, chronic anemia, anxiety/depression, diabetic neuropathy, GERD, hypothyroidism, COPD, pulmonary hypertension. 2/6, patient presented to the ED with complaint of cough, subjective fever, myalgia, generalized weakness for 5 days. Respiratory virus panel positive for influenza A. Chest x-ray negative for infiltrates. Also noted to have right leg cellulitis.  Assessment & Plan     Right leg wound infection -Prior right ankle surgery (12/2022) complicated by postsurgical wound infection. -S/p right ankle hardware removal, I&D and wound VAC placement on 2/10 by Dr. Jena Gauss -S/p repeat debridement of the right ankle by Dr. Lajoyce Corners 04/02/2023.   -ID consulted and recommended continuing current cefepime and daptomycin for 4 weeks since surgery EOT 05/12/2023 with weekly CK as outpatient. -PT recommended SNF however patient will have to pay out-of-pocket and unable to afford. -Central line placed on 04/07/2023 with some post-procedural bleed noted, but now stable on heparin gtt, anticipate transition to eliquis once renal function stabilizes -Has wound VAC   PAD -Seen by vascular surgery underwent aortogram, right leg angiogram, successful stenting of right superficial femoral artery on 04/01/2023.   -Patient on Plavix and heparin gtt for now -With concerns of bleeding at central line site, pt is now on heparin gtt for now. Would transition to eliquis once renal function stabilizes   Acute diarrhea Resolved   Influenza A:  -  Presented with 5 days of cough, fever, myalgia.  - Respiratory virus panel positive for influenza A. Chest x-ray without acute infiltrate -Improved with symptomatic treatment, was outside 48-hour window for Tamiflu hence was not initiated.    Type 2 diabetes mellitus / Diabetic neuropathy, POA: A1c 7 on 01/02/2023. PTA meds-Tresiba 60 units daily, Mounjaro  CBG (last 3)  Recent Labs    04/15/23 2334 04/16/23 0525 04/16/23 1214  GLUCAP 224* 136* 120*    -Currently on Semglee to 40 units daily, sliding scale insulin sensitive  Diabetic neuropathy -PTA reportedly on gabapentin 1600 mg 3 times daily. Since dose was unable to be confirmed, pt was continued on 400 mg 3 times daily instead   Hypokalemia/hypomagnesemia: Improved   Paroxysmal A-fib Rates controlled on PTA meds Coreg, amiodarone  -CHADS-VASc of 4 -Eliquis held secondary to bleeding from CVL recently, now on heparin gtt with no acute bleed -If no further bleed from CVL and when renal function stabilizes, would transition to eliquis at that time   Essential hypertension -PTA meds- Coreg 3.125 mg twice daily, Lasix 160 mg daily -Currently on Coreg, amlodipine.  Lasix held due to AKI   Anemia of chronic disease: Baseline hemoglobin between 10.5 11.5.   -  Hgb remains stable. Bleeding recently noted at CVL site, eliquis held for now -Cont on heparin gtt for now, tolerating thus far without further bleed   CAD/stent Carotid artery stenosis HLD Continue PTA meds -Plavix, Crestor -Eliquis held for now, continue heparin gtt and monitor for bleed   Hypothyroidism Continue Synthroid   Obstructive sleep apnea Patient reported good compliance on home nocturnal CPAP   Gout Continue allopurinol, colchicine   Depression Continue Cymbalta   AKI on CKD stage IIIb:  -Baseline creatinine appears to be 1.9-2.2.   -Creatinine peaked to 3.1 on 2/19 that started to trend up after increasing lasix dose to 80mg  -Lasix placed on  hold however creatinine continues to trend up.  Nephrology following -Received IV albumin, avoid nephrotoxic meds    Orthostatic hypotension -Received brief IV fluids, continue abdominal binder -2D echo showed EF 55 to 60%, basal inferior hypokinesis, G2 DD    Moderate obesity, class II Estimated body mass index is 39.41 kg/m as calculated from the following:   Height as of this encounter: 6\' 1"  (1.854 m).   Weight as of this encounter: 135.5 kg.  Code Status: Full code DVT Prophylaxis:  Place and maintain sequential compression device Start: 04/15/23 1549 SCDs Start: 04/02/23 1617 SCDs Start: 03/31/23 1620 SCDs Start: 03/27/23 2243   Level of Care: Level of care: Progressive Cardiac Family Communication: Updated patient Disposition Plan:      Remains inpatient appropriate:      Procedures:  2D echo  Consultants:    Antimicrobials:   Anti-infectives (From admission, onward)    Start     Dose/Rate Route Frequency Ordered Stop   04/08/23 0000  daptomycin (CUBICIN) IVPB  Status:  Discontinued        700 mg Intravenous Every 24 hours 04/08/23 1147 04/08/23    04/08/23 0000  ceFEPime (MAXIPIME) IVPB        2 g Intravenous Every 12 hours 04/08/23 1147 05/12/23 2359   04/08/23 0000  daptomycin (CUBICIN) IVPB        700 mg Intravenous Every 24 hours 04/08/23 1150 05/12/23 2359   04/04/23 1400  DAPTOmycin (CUBICIN) IVPB 700 mg/142mL premix        700 mg 200 mL/hr over 30 Minutes Intravenous Daily 04/03/23 1435     04/02/23 1715  ceFAZolin (ANCEF) IVPB 2g/100 mL premix  Status:  Discontinued        2 g 200 mL/hr over 30 Minutes Intravenous Every 8 hours 04/02/23 1616 04/02/23 1623   04/02/23 0600  ceFAZolin (ANCEF) IVPB 3g/150 mL premix  Status:  Discontinued        3 g 300 mL/hr over 30 Minutes Intravenous On call to O.R. 04/01/23 1700 04/02/23 1549   03/31/23 1015  ceFAZolin (ANCEF) IVPB 2g/100 mL premix        2 g 200 mL/hr over 30 Minutes Intravenous On call to O.R.  03/31/23 0921 03/31/23 1203   03/29/23 2200  vancomycin (VANCOREADY) IVPB 2000 mg/400 mL  Status:  Discontinued       Placed in "Followed by" Linked Group   2,000 mg 200 mL/hr over 120 Minutes Intravenous Every 48 hours 03/27/23 2257 04/03/23 1435   03/28/23 1045  metroNIDAZOLE (FLAGYL) tablet 500 mg  Status:  Discontinued        500 mg Oral Every 12 hours 03/28/23 0955 04/03/23 0945   03/27/23 2330  vancomycin (VANCOCIN) 2,500 mg in sodium chloride 0.9 % 500 mL IVPB       Placed in "  Followed by" Linked Group   2,500 mg 262.5 mL/hr over 120 Minutes Intravenous  Once 03/27/23 2257 03/28/23 0241   03/27/23 2300  metroNIDAZOLE (FLAGYL) IVPB 500 mg  Status:  Discontinued        500 mg 100 mL/hr over 60 Minutes Intravenous Every 12 hours 03/27/23 2247 03/28/23 0955   03/27/23 2300  ceFEPIme (MAXIPIME) 2 g in sodium chloride 0.9 % 100 mL IVPB        2 g 200 mL/hr over 30 Minutes Intravenous Every 12 hours 03/27/23 2257     03/27/23 2100  ceFAZolin (ANCEF) IVPB 2g/100 mL premix        2 g 200 mL/hr over 30 Minutes Intravenous  Once 03/27/23 2058 03/27/23 2219          Medications  allopurinol  100 mg Oral Daily   amiodarone  200 mg Oral BID   carvedilol  3.125 mg Oral BID WC   Chlorhexidine Gluconate Cloth  6 each Topical Q0600   clopidogrel  75 mg Oral Q breakfast   docusate sodium  100 mg Oral BID   DULoxetine  60 mg Oral Daily   ezetimibe  10 mg Oral Daily   gabapentin  400 mg Oral TID   guaiFENesin  600 mg Oral BID   insulin aspart  0-9 Units Subcutaneous Q6H   insulin glargine  40 Units Subcutaneous Daily   levothyroxine  88 mcg Oral QAC breakfast   rosuvastatin  20 mg Oral QPM   sodium chloride flush  3 mL Intravenous Q12H   sodium chloride flush  3-10 mL Intravenous Q12H      Subjective:   Harold [name] was seen and examined today.  No acute complaints.  Patient denies dizziness, chest pain, shortness of breath, abdominal pain, N/V/D/C, No acute events overnight.     Objective:   Vitals:   04/16/23 0410 04/16/23 0514 04/16/23 0744 04/16/23 0857  BP: (!) 143/58 138/62 (!) 152/58 (!) 149/65  Pulse: (!) 58 (!) 56  (!) 58  Resp:  18 19   Temp:  98 F (36.7 C) 98 F (36.7 C)   TempSrc:  Axillary Axillary   SpO2: 93% 94%    Weight:  135.5 kg    Height:        Intake/Output Summary (Last 24 hours) at 04/16/2023 1442 Last data filed at 04/16/2023 0957 Gross per 24 hour  Intake 155.61 ml  Output 1950 ml  Net -1794.39 ml     Wt Readings from Last 3 Encounters:  04/16/23 135.5 kg  01/28/23 (!) 142.9 kg  01/09/23 (!) 145 kg     Exam General: Alert and oriented x 3, NAD Cardiovascular: S1 S2 auscultated,  RRR Respiratory: Clear to auscultation bilaterally, no wheezing, rales or rhonchi Gastrointestinal: Soft, nontender, nondistended, + bowel sounds Ext: trace pedal edema bilaterally, RLE dressing intact Neuro: no new FND's Psych: Normal affect     Data Reviewed:  I have personally reviewed following labs    CBC Lab Results  Component Value Date   WBC 5.3 04/16/2023   RBC 2.71 (L) 04/16/2023   HGB 8.0 (L) 04/16/2023   HCT 24.0 (L) 04/16/2023   MCV 88.6 04/16/2023   MCH 29.5 04/16/2023   PLT 121 (L) 04/16/2023   MCHC 33.3 04/16/2023   RDW 15.6 (H) 04/16/2023   LYMPHSABS 0.9 04/15/2023   MONOABS 0.6 04/15/2023   EOSABS 0.3 04/15/2023   BASOSABS 0.1 04/15/2023     Last metabolic panel Lab Results  Component Value Date   NA 140 04/16/2023   K 3.5 04/16/2023   CL 107 04/16/2023   CO2 24 04/16/2023   BUN 36 (H) 04/16/2023   CREATININE 3.20 (H) 04/16/2023   GLUCOSE 143 (H) 04/16/2023   GFRNONAA 20 (L) 04/16/2023   GFRAA 47 (L) 09/28/2019   CALCIUM 8.9 04/16/2023   PHOS 3.4 01/09/2023   PROT 5.8 (L) 04/16/2023   ALBUMIN 3.3 (L) 04/16/2023   LABGLOB 2.2 05/12/2018   AGRATIO 1.9 05/12/2018   BILITOT 0.6 04/16/2023   ALKPHOS 73 04/16/2023   AST 50 (H) 04/16/2023   ALT 42 04/16/2023   ANIONGAP 9 04/16/2023    CBG  (last 3)  Recent Labs    04/15/23 2334 04/16/23 0525 04/16/23 1214  GLUCAP 224* 136* 120*      Coagulation Profile: No results for input(s): "INR", "PROTIME" in the last 168 hours.   Radiology Studies: I have personally reviewed the imaging studies  ECHOCARDIOGRAM COMPLETE Result Date: 04/16/2023    ECHOCARDIOGRAM REPORT   Patient Name:   Harold [name] Date of Exam: 04/16/2023 Medical Rec #:  [id number]           Height:       73.0 in Accession #:    [id number]          Weight:       298.7 lb Date of Birth:  1955/07/09           BSA:          2.552 m Patient Age:    67 years            BP:           149/65 mmHg Patient Gender: M                   HR:           63 bpm. Exam Location:  Inpatient Procedure: 2D Echo, Color Doppler and Cardiac Doppler (Both Spectral and Color            Flow Doppler were utilized during procedure). Indications:    acute diastolic chf  History:        Patient has prior history of Echocardiogram examinations, most                 recent 02/21/2022. CAD, chronic kidney disease and PAD; Risk                 Factors:Hypertension, Diabetes, Dyslipidemia and Sleep Apnea.  Sonographer:    Delcie Roch RDCS Referring Phys: [id number] STEPHEN K CHIU IMPRESSIONS  1. Left ventricular ejection fraction, by estimation, is 55 to 60%. The left ventricle has normal function. The left ventricle demonstrates regional wall motion abnormalities with basal inferior hypokinesis. The left ventricular internal cavity size was  mildly dilated. Left ventricular diastolic parameters are consistent with Grade II diastolic dysfunction (pseudonormalization).  2. Right ventricular systolic function is normal. The right ventricular size is mildly enlarged. There is mildly elevated pulmonary artery systolic pressure. The estimated right ventricular systolic pressure is 38.3 mmHg.  3. Left atrial size was moderately dilated.  4. Right atrial size was mildly dilated.  5. The mitral valve is normal in  structure. Trivial mitral valve regurgitation. No evidence of mitral stenosis.  6. The aortic valve is tricuspid. There is mild calcification of the aortic valve. Aortic valve regurgitation is not visualized. No aortic stenosis is present.  7. The inferior vena cava is normal  in size with greater than 50% respiratory variability, suggesting right atrial pressure of 3 mmHg. FINDINGS  Left Ventricle: Left ventricular ejection fraction, by estimation, is 55 to 60%. The left ventricle has normal function. The left ventricle demonstrates regional wall motion abnormalities. The left ventricular internal cavity size was mildly dilated. There is no left ventricular hypertrophy. Left ventricular diastolic parameters are consistent with Grade II diastolic dysfunction (pseudonormalization). Right Ventricle: The right ventricular size is mildly enlarged. No increase in right ventricular wall thickness. Right ventricular systolic function is normal. There is mildly elevated pulmonary artery systolic pressure. The tricuspid regurgitant velocity is 2.97 m/s, and with an assumed right atrial pressure of 3 mmHg, the estimated right ventricular systolic pressure is 38.3 mmHg. Left Atrium: Left atrial size was moderately dilated. Right Atrium: Right atrial size was mildly dilated. Pericardium: There is no evidence of pericardial effusion. Mitral Valve: The mitral valve is normal in structure. Mild mitral annular calcification. Trivial mitral valve regurgitation. No evidence of mitral valve stenosis. Tricuspid Valve: The tricuspid valve is normal in structure. Tricuspid valve regurgitation is trivial. Aortic Valve: The aortic valve is tricuspid. There is mild calcification of the aortic valve. Aortic valve regurgitation is not visualized. No aortic stenosis is present. Pulmonic Valve: The pulmonic valve was normal in structure. Pulmonic valve regurgitation is not visualized. Aorta: The aortic root is normal in size and structure.  Venous: The inferior vena cava is normal in size with greater than 50% respiratory variability, suggesting right atrial pressure of 3 mmHg. IAS/Shunts: No atrial level shunt detected by color flow Doppler.  LEFT VENTRICLE PLAX 2D LVIDd:         6.30 cm   Diastology LVIDs:         4.80 cm   LV e' medial:    7.94 cm/s LV PW:         1.20 cm   LV E/e' medial:  16.8 LV IVS:        1.20 cm   LV e' lateral:   10.00 cm/s LVOT diam:     2.40 cm   LV E/e' lateral: 13.3 LV SV:         99 LV SV Index:   39 LVOT Area:     4.52 cm  RIGHT VENTRICLE             IVC RV Basal diam:  3.00 cm     IVC diam: 1.70 cm RV S prime:     12.80 cm/s TAPSE (M-mode): 2.3 cm LEFT ATRIUM              Index        RIGHT ATRIUM           Index LA diam:        5.20 cm  2.04 cm/m   RA Area:     19.30 cm LA Vol (A2C):   125.0 ml 48.99 ml/m  RA Volume:   51.10 ml  20.03 ml/m LA Vol (A4C):   106.0 ml 41.54 ml/m LA Biplane Vol: 123.0 ml 48.20 ml/m  AORTIC VALVE LVOT Vmax:   96.10 cm/s LVOT Vmean:  64.300 cm/s LVOT VTI:    0.219 m  AORTA Ao Root diam: 3.40 cm Ao Asc diam:  3.60 cm MITRAL VALVE                TRICUSPID VALVE MV Area (PHT): 3.72 cm     TR Peak grad:   35.3 mmHg MV Decel Time: 204  msec     TR Vmax:        297.00 cm/s MV E velocity: 133.00 cm/s MV A velocity: 50.10 cm/s   SHUNTS MV E/A ratio:  2.65         Systemic VTI:  0.22 m                             Systemic Diam: 2.40 cm Harold McleanMD Electronically signed by Wilfred Lacy Signature Date/Time: 04/16/2023/2:24:56 PM    Final    US RENAL Result Date: 04/16/2023 CLINICAL DATA:  Acute kidney injury EXAM: RENAL / URINARY TRACT ULTRASOUND COMPLETE COMPARISON:  None Available. FINDINGS: Right Kidney: Renal measurements: 12.9 x 6.9 x 6.3 cm = volume: 293 mL. Echogenicity within normal limits. No mass or hydronephrosis visualized. Left Kidney: Renal measurements: 13.0 x 6.2 x 5.5 cm = volume: 231 mL. Echogenicity within normal limits. No mass or hydronephrosis visualized. Bladder:  Appears normal for degree of bladder distention. Other: None. IMPRESSION: Negative renal ultrasound. Electronically Signed   By: Charline Bills M.D.   On: 04/16/2023 02:42       Ayyan Sites M.D. [facility] 04/16/2023, 2:42 PM  Available via Epic secure chat 7am-7pm After 7 pm, please refer to night coverage provider listed on amion.

## 2023-04-16 NOTE — Progress Notes (Signed)
 [facility] Progress Note    Assessment/ Plan:   AKI on CKD, nonoliguric -Followed at our office for CKD care, baseline Cr in the 2s typically. Suspecting his current AKI is related to episode decreased renal perfusion in the context of orthostatic hypotension. ATN evident on urine sediment -Cr up to 3.2 today -renal u/s unremarkable. urine with proteinuria/glucosuria, granular casts, 0-5 rbcs+wbcs/hpf. Moderate blood on dipstick however -CK WNL -if considering AIN then will need to consider renal biopsy especially given that he is on cefepime and daptomycin. Will consider this if he fails to improve -continue to hold diuretics -on albumin q8h x 1 day--will continue another day and reassess -no indication for renal replacement therapy at this junction -Avoid nephrotoxic medications including NSAIDs and iodinated intravenous contrast exposure unless the latter is absolutely indicated.  Preferred narcotic agents for pain control are hydromorphone, fentanyl, and methadone. Morphine should not be used. Avoid Baclofen and avoid oral sodium phosphate and magnesium citrate based laxatives / bowel preps. Continue strict Input and Output monitoring. Will monitor the patient closely with you and intervene or adjust therapy as indicated by changes in clinical status/labs    Orthostatic hypotension H/o HTN -recommend compression stockings and abd binder for now. Albumin/fluids as above. Albumin stopped. Echo pending   Edema -volume status is not that bad today. Echo pending. Can continue to hold diuretics for now   RLE infection Hardware infection -s/p I&D, hardware removal 2/10, debridement 2/12 -on cefepime & dapto x 4 weeks   Afib -on hep gtt currently   DM2 -per primary   Anemia due to chronic disease, thrombocytopenia -Transfuse for Hgb<7 g/dL. Hgb trending down. W/u per primary service   Acidosis -mild, likely related to AKI, monitor for now  Subjective:   Patient  seen and examined bedside. Uop charted ~2.3L. no complaints currently. He reports that his orthostats were checked earlier (unable to see it in flowsheet) and BP wasn't as bad as yesterday. He denies any chest pain, SOB, worsening swelling   Objective:   BP (!) 149/65   Pulse (!) 58   Temp 98 F (36.7 C) (Axillary)   Resp 19   Ht 6\' 1"  (1.854 m)   Wt 135.5 kg   SpO2 94%   BMI 39.41 kg/m   Intake/Output Summary (Last 24 hours) at 04/16/2023 1242 Last data filed at 04/16/2023 0957 Gross per 24 hour  Intake 168.81 ml  Output 2250 ml  Net -2081.19 ml   Weight change: 4.5 kg  Physical Exam: Gen: NAD, in bed CVS: RRR Resp: CTA B/L Abd: soft Ext: minimal/trace LLE edema Neuro: awake, alert  Imaging: US RENAL Result Date: 04/16/2023 CLINICAL DATA:  Acute kidney injury EXAM: RENAL / URINARY TRACT ULTRASOUND COMPLETE COMPARISON:  None Available. FINDINGS: Right Kidney: Renal measurements: 12.9 x 6.9 x 6.3 cm = volume: 293 mL. Echogenicity within normal limits. No mass or hydronephrosis visualized. Left Kidney: Renal measurements: 13.0 x 6.2 x 5.5 cm = volume: 231 mL. Echogenicity within normal limits. No mass or hydronephrosis visualized. Bladder: Appears normal for degree of bladder distention. Other: None. IMPRESSION: Negative renal ultrasound. Electronically Signed   By: Charline Bills M.D.   On: 04/16/2023 02:42    Labs: BMET Recent Labs  Lab 04/10/23 0429 04/11/23 0625 04/12/23 0415 04/13/23 0815 04/14/23 0423 04/15/23 0357 04/16/23 0752  NA 136 134* 138 137 138 137 140  K 3.1* 3.4* 3.9 3.8 3.7 3.7 3.5  CL 101 101 101 106 104 108 107  CO2 24 24 24 24 23  21* 24  GLUCOSE 143* 182* 168* 170* 154* 192* 143*  BUN 35* 32* 33* 35* 36* 36* 36*  CREATININE 2.63* 2.62* 2.62* 2.85* 2.98* 3.04* 3.20*  CALCIUM 8.9 9.0 9.5 8.7* 8.8* 8.4* 8.9   CBC Recent Labs  Lab 04/14/23 0423 04/15/23 0357 04/15/23 1452 04/16/23 0752  WBC 6.4 6.3 8.0 5.3  NEUTROABS  --   --  6.0  --    HGB 9.8* 9.1* 9.2* 8.0*  HCT 29.2* 26.4* 27.0* 24.0*  MCV 86.1 85.4 86.3 88.6  PLT 183 146* 168 121*    Medications:     allopurinol  100 mg Oral Daily   amiodarone  200 mg Oral BID   carvedilol  3.125 mg Oral BID WC   Chlorhexidine Gluconate Cloth  6 each Topical Q0600   clopidogrel  75 mg Oral Q breakfast   docusate sodium  100 mg Oral BID   DULoxetine  60 mg Oral Daily   ezetimibe  10 mg Oral Daily   gabapentin  400 mg Oral TID   guaiFENesin  600 mg Oral BID   insulin aspart  0-9 Units Subcutaneous Q6H   insulin glargine  40 Units Subcutaneous Daily   levothyroxine  88 mcg Oral QAC breakfast   rosuvastatin  20 mg Oral QPM   sodium chloride flush  3 mL Intravenous Q12H   sodium chloride flush  3-10 mL Intravenous Q12H      Anthony Sar, MD [facility] 04/16/2023, 12:42 PM

## 2023-04-16 NOTE — Progress Notes (Signed)
 Occupational Therapy Treatment Patient Details Name: Harold [name] MRN: [id number] DOB: 1955-10-01 Today's Date: 04/16/2023   History of present illness 68 y.o. male presents to [facility] 03/27/23 with productive cough and generalized weakness. Admitted with influenza A and cellulitis of R foot s/p hardware removal of the R ankle and application of wound vac on 2/10. Prior admit 11/24 for R ankle ORIF and in 12/24 for revision. S/p excisional debridement of right ankle on 2/12. Pt underwent central line placement on 04/07/2023 however he has been having issues with recurrent bleeding from the insertion site which has finally stopped. Significant PMH: HTN, HLD, CAD s/p PCI, COPD, DM2, CKD stage IIIb, PVD, gout, OSA, neuropathy, COPD, a-fib, CAD s/p percutaneous coronary angioplasty, angina, obesity   OT comments  STAR Patient OT/PT session:  Donned CAM boot only for LB dressing on this date due to patient expected to go to [facility]. Patient CGA to get to EOB and required max assist to donn CAM boot on RLE. Abdominal binder donned to address BP. Patient not having dizziness episodes while standing but anxiety due to binder, wound vac, and multiple lines. Patient address sit to stand from EOB and transfers to recliner before the arrival of transport. Acute OT to continue to follow with STAR program to address established goals to facilitate DC to next venue of care.       If plan is discharge home, recommend the following:  A little help with walking and/or transfers;A lot of help with bathing/dressing/bathroom;Assistance with cooking/housework;Assist for transportation;Help with stairs or ramp for entrance   Equipment Recommendations  Other (comment) (bariatric BSC)    Recommendations for Other Services      Precautions / Restrictions Precautions Precautions: Fall Recall of Precautions/Restrictions: Impaired Precaution/Restrictions Comments: Watch BP; wound vac RLE Required Braces or Orthoses: Other  Brace Other Brace: R CAM boot for ambulation, R splint in bed, abdominal binder Restrictions Weight Bearing Restrictions Per Provider Order: Yes RLE Weight Bearing Per Provider Order: Touchdown weight bearing Other Position/Activity Restrictions: per Dr. Audrie Lia note 04/03/2023       Mobility Bed Mobility Overal bed mobility: Needs Assistance Bed Mobility: Supine to Sit, Sit to Supine     Supine to sit: HOB elevated, Used rails, Contact guard Sit to supine: HOB elevated, Contact guard assist   General bed mobility comments: contact guard for coming to side of bed with use of bed rails, and for returning to supine    Transfers Overall transfer level: Needs assistance Equipment used: Rolling walker (2 wheels) Transfers: Sit to/from Stand Sit to Stand: Min assist, From elevated surface, Mod assist, +2 safety/equipment     Step pivot transfers: Min assist     General transfer comment: minAx2 for STS from elevated bed surface, and minAx2 for stepping transfer from bed to chair, pt still with very limited ability to offweight RLE with use of UE on RW, pt requiring modAx2 for power up from low recliner seat     Balance Overall balance assessment: Needs assistance Sitting-balance support: No upper extremity supported, Feet supported Sitting balance-Leahy Scale: Fair Sitting balance - Comments: statically Postural control: Posterior lean Standing balance support: Bilateral upper extremity supported, During functional activity, Reliant on assistive device for balance Standing balance-Leahy Scale: Poor Standing balance comment: Reliant on RW                           ADL either performed or assessed with clinical judgement   ADL  Overall ADL's : Needs assistance/impaired Eating/Feeding: Set up                   Lower Body Dressing: Maximal assistance;Sit to/from stand Lower Body Dressing Details (indicate cue type and reason): donned CAM boot and splint for  RLE                    Extremity/Trunk Assessment              Vision       Perception     Praxis     Communication Communication Communication: No apparent difficulties;Impaired   Cognition Arousal: Alert Behavior During Therapy: Anxious (patient with claustrophobic feeling with ab binder, wound vac, and multiple lines)                                 Following commands: Intact        Cueing   Cueing Techniques: Verbal cues, Tactile cues  Exercises      Shoulder Instructions       General Comments decreased BP issues while wearing abdominal binder    Pertinent Vitals/ Pain       Pain Assessment Pain Assessment: Faces Faces Pain Scale: Hurts a little bit Pain Location: R leg Pain Descriptors / Indicators: Discomfort, Guarding Pain Intervention(s): Limited activity within patient's tolerance, Monitored during session, Repositioned  Home Living                                          Prior Functioning/Environment              Frequency  Min 1X/week        Progress Toward Goals  OT Goals(current goals can now be found in the care plan section)  Progress towards OT goals: Progressing toward goals  Acute Rehab OT Goals Patient Stated Goal: to go home OT Goal Formulation: With patient Time For Goal Achievement: 04/15/23 Potential to Achieve Goals: Good ADL Goals Pt Will Perform Grooming: with set-up;sitting Pt Will Perform Lower Body Dressing: with min assist;with adaptive equipment;with caregiver independent in assisting;sitting/lateral leans;sit to/from stand Pt Will Transfer to Toilet: stand pivot transfer;with min assist;bedside commode Pt Will Perform Toileting - Clothing Manipulation and hygiene: with mod assist;sitting/lateral leans;sit to/from stand  Plan      Co-evaluation    PT/OT/SLP Co-Evaluation/Treatment: Yes Reason for Co-Treatment: To address functional/ADL transfers;For  patient/therapist safety;Other (comment) (act tolerance) PT goals addressed during session: Mobility/safety with mobility;Balance;Proper use of DME OT goals addressed during session: ADL's and self-care      AM-PAC OT "6 Clicks" Daily Activity     Outcome Measure   Help from another person eating meals?: None Help from another person taking care of personal grooming?: None Help from another person toileting, which includes using toliet, bedpan, or urinal?: A Lot Help from another person bathing (including washing, rinsing, drying)?: A Lot Help from another person to put on and taking off regular upper body clothing?: A Little Help from another person to put on and taking off regular lower body clothing?: A Lot 6 Click Score: 17    End of Session Equipment Utilized During Treatment: Gait belt;Rolling walker (2 wheels)  OT Visit Diagnosis: Unsteadiness on feet (R26.81);Other abnormalities of gait and mobility (R26.89);Muscle weakness (generalized) (M62.81);Pain Pain - Right/Left: Right Pain -  part of body: Ankle and joints of foot   Activity Tolerance Patient tolerated treatment well;Other (comment)   Patient Left in bed;Other (comment) (left with transportation)   Nurse Communication Mobility status        Time: 0981-1914 OT Time Calculation (min): 44 min  Charges: OT General Charges $OT Visit: 1 Visit OT Treatments $Self Care/Home Management : 8-22 mins $Therapeutic Activity: 8-22 mins  Alfonse Flavors, OTA Acute Rehabilitation Services  Office [phone]   Dewain Penning 04/16/2023, 2:35 PM

## 2023-04-16 NOTE — Progress Notes (Signed)
 Physical Therapy Treatment Patient Details Name: Harold [name] MRN: [id number] DOB: February 15, 1956 Today's Date: 04/16/2023   History of Present Illness 68 y.o. male presents to [facility] 03/27/23 with productive cough and generalized weakness. Admitted with influenza A and cellulitis of R foot s/p hardware removal of the R ankle and application of wound vac on 2/10. Prior admit 11/24 for R ankle ORIF and in 12/24 for revision. S/p excisional debridement of right ankle on 2/12. Pt underwent central line placement on 04/07/2023 however he has been having issues with recurrent bleeding from the insertion site which has finally stopped. Significant PMH: HTN, HLD, CAD s/p PCI, COPD, DM2, CKD stage IIIb, PVD, gout, OSA, neuropathy, COPD, a-fib, CAD s/p percutaneous coronary angioplasty, angina, obesity    PT Comments  STAR PT/OT Session: Pt agreeable to trying abdominal binder to improve BP with mobility. With abdominal binder in place pt BP is improved  (124/54 (77)) however pt experiences increased anxiety today from binder, and multiple IV, and wound vac, which limits his ability to progress mobility away from bed. Pt is contact guard for bed mobility,  minAx2 for STS from elevated surface, and modAx2 for STS from lower recliner surface. Working towards discharge this weekend however pt is not ambulating the 30-40 feet that he reports he needs to be able to do when his SO is not available.    If plan is discharge home, recommend the following: A lot of help with walking and/or transfers;A lot of help with bathing/dressing/bathroom;Assistance with cooking/housework;Assist for transportation;Help with stairs or ramp for entrance   Can travel by private vehicle     Yes  Equipment Recommendations  BSC/3in1 (Bariatric; Pt has a tub bench, w/c, RW, and sliding board already)       Precautions / Restrictions Precautions Precautions: Fall Recall of Precautions/Restrictions: Impaired Precaution/Restrictions  Comments: Watch BP; wound vac RLE Required Braces or Orthoses: Other Brace Other Brace: R CAM boot for ambulation, R splint in bed, abdominal binder Restrictions Weight Bearing Restrictions Per Provider Order: Yes RLE Weight Bearing Per Provider Order: Touchdown weight bearing Other Position/Activity Restrictions: per Dr. Audrie Lia note 04/03/2023     Mobility  Bed Mobility Overal bed mobility: Needs Assistance Bed Mobility: Supine to Sit     Supine to sit: HOB elevated, Used rails, Contact guard Sit to supine: HOB elevated, Contact guard assist   General bed mobility comments: contact guard for coming to side of bed with use of bed rails, and for returning to supine    Transfers Overall transfer level: Needs assistance Equipment used: Rolling walker (2 wheels) Transfers: Sit to/from Stand Sit to Stand: Min assist, From elevated surface, Mod assist, +2 safety/equipment   Step pivot transfers: Min assist       General transfer comment: minAx2 for STS from elevated bed surface, and minAx2 for stepping transfer from bed to chair, pt still with very limited ability to offweight RLE with use of UE on RW, pt requiring modAx2 for power up from low recliner seat    Ambulation/Gait               General Gait Details: deferred due to transport present to take to procedure       Balance Overall balance assessment: Needs assistance Sitting-balance support: No upper extremity supported, Feet supported Sitting balance-Leahy Scale: Fair Sitting balance - Comments: statically Postural control: Posterior lean Standing balance support: Bilateral upper extremity supported, During functional activity, Reliant on assistive device for balance Standing balance-Leahy Scale: Poor Standing  balance comment: Reliant on RW                            Communication Communication Communication: No apparent difficulties;Impaired  Cognition Arousal: Alert Behavior During Therapy:  Anxious (pt with claustrophobic feeling with abdominal binder, wound vac and multiple IVs limiting pt ability to follow commands)   PT - Cognitive impairments: Memory, Safety/Judgement, Problem solving                       PT - Cognition Comments: deficits with memory, safety awareness especially in terms of R LE weightbearing restrictions, and word finding, clarity of speech Following commands: Intact      Cueing Cueing Techniques: Verbal cues, Tactile cues     General Comments General comments (skin integrity, edema, etc.): BP improved with use of abdominal binder 124/54 (77) but binder produces increased anxiety limiting mobilization      Pertinent Vitals/Pain Pain Assessment Pain Assessment: Faces Faces Pain Scale: Hurts a little bit Pain Location: R leg Pain Descriptors / Indicators: Discomfort, Guarding Pain Intervention(s): Limited activity within patient's tolerance, Monitored during session, Repositioned     PT Goals (current goals can now be found in the care plan section) Acute Rehab PT Goals Patient Stated Goal: Return home confidently PT Goal Formulation: With patient Time For Goal Achievement: 04/17/23 Potential to Achieve Goals: Good Progress towards PT goals: Progressing toward goals    Frequency    Min 1X/week           Co-evaluation PT/OT/SLP Co-Evaluation/Treatment: Yes Reason for Co-Treatment: To address functional/ADL transfers;For patient/therapist safety;Other (comment) (act tolerance) PT goals addressed during session: Mobility/safety with mobility;Balance;Proper use of DME OT goals addressed during session: ADL's and self-care      AM-PAC PT "6 Clicks" Mobility   Outcome Measure  Help needed turning from your back to your side while in a flat bed without using bedrails?: A Little Help needed moving from lying on your back to sitting on the side of a flat bed without using bedrails?: A Little Help needed moving to and from a bed  to a chair (including a wheelchair)?: A Little Help needed standing up from a chair using your arms (e.g., wheelchair or bedside chair)?: A Lot Help needed to walk in hospital room?: Total Help needed climbing 3-5 steps with a railing? : Total 6 Click Score: 13    End of Session Equipment Utilized During Treatment: Gait belt Activity Tolerance: Other (comment) (pt limited by increased feeling of claustrophobia with binder in place) Patient left: in bed;Other (comment) (transport in room) Nurse Communication: Mobility status;Other (comment) (BP) PT Visit Diagnosis: Other abnormalities of gait and mobility (R26.89);Unsteadiness on feet (R26.81);Muscle weakness (generalized) (M62.81);History of falling (Z91.81);Difficulty in walking, not elsewhere classified (R26.2)     Time: 8469-6295 PT Time Calculation (min) (ACUTE ONLY): 45 min  Charges:    $Therapeutic Activity: 8-22 mins PT General Charges $$ ACUTE PT VISIT: 1 Visit                     Robina Hamor B. Beverely Risen PT, DPT Acute Rehabilitation Services Please use secure chat or  Call Office [phone]    Elon Alas Fleet 04/16/2023, 2:15 PM

## 2023-04-16 NOTE — Progress Notes (Signed)
 Pharmacy Antibiotic Note  KAIREE ISA is a 68 y.o. 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 Plan for daptomycin and cefepime per ID with anticipated stop date of 05/12/23. Last CK ok. CrCl remains ~33 ml/min.  Plan: Continue Cefepime 2g q12hr Continue Daptomycin 700 mg q24hr  Will follow renal function, cultures and clinical progress   Height: 6\' 1"  (185.4 cm) Weight: 135.5 kg (298 lb 11.6 oz) IBW/kg (Calculated) : 79.9  Temp (24hrs), Avg:97.6 F (36.4 C), Min:97 F (36.1 C), Max:98 F (36.7 C)  Recent Labs  Lab 04/11/23 0625 04/12/23 0415 04/13/23 0815 04/14/23 0423 04/15/23 0357 04/15/23 1452 04/16/23 0752  WBC 7.7 6.1 6.6 6.4 6.3 8.0 5.3  CREATININE 2.62* 2.62* 2.85* 2.98* 3.04*  --   --     Estimated Creatinine Clearance: 33.1 mL/min (A) (by C-G formula based on SCr of 3.04 mg/dL (H)).    Allergies  Allergen Reactions   Doxycycline Hives and Rash   Septra [Bactrim] Itching   Penicillins Rash    Allergy to All cillin drugs  Ancef given 9/24 with no obvious reaction   Metformin And Related Diarrhea and Nausea Only   Other Nausea And Vomiting    General Anesthesia - sometimes causes nausea and vomiting * OK to use scopolamine patch     Sulfamethoxazole-Trimethoprim Other (See Comments)   Wellbutrin [Bupropion]     Mood Changes    Thank you for involving pharmacy in the patient's care.    Fredonia Highland, PharmD, BCPS, [facility] Clinical Pharmacist [phone] Please check AMION for all [facility] Pharmacy numbers 04/16/2023

## 2023-04-16 NOTE — Progress Notes (Signed)
 PHARMACY - ANTICOAGULATION CONSULT NOTE  Pharmacy Consult for heparin Indication: atrial fibrillation  Allergies  Allergen Reactions   Doxycycline Hives and Rash   Septra [Bactrim] Itching   Penicillins Rash    Allergy to All cillin drugs  Ancef given 9/24 with no obvious reaction   Metformin And Related Diarrhea and Nausea Only   Other Nausea And Vomiting    General Anesthesia - sometimes causes nausea and vomiting * OK to use scopolamine patch     Sulfamethoxazole-Trimethoprim Other (See Comments)   Wellbutrin [Bupropion]     Mood Changes    Patient Measurements: Height: 6\' 1"  (185.4 cm) Weight: 135.5 kg (298 lb 11.6 oz) IBW/kg (Calculated) : 79.9 Heparin Dosing Weight: 112.8 kg  Vital Signs: Temp: 98 F (36.7 C) (02/26 0744) Temp Source: Axillary (02/26 0744) BP: 149/65 (02/26 0857) Pulse Rate: 58 (02/26 0857)  Labs: Recent Labs    04/14/23 0423 04/15/23 0357 04/15/23 1452 04/16/23 0752  HGB 9.8* 9.1* 9.2* 8.0*  HCT 29.2* 26.4* 27.0* 24.0*  PLT 183 146* 168 121*  APTT 72* 82*  --  71*  HEPARINUNFRC >1.10* 1.04*  --  0.88*  CREATININE 2.98* 3.04*  --   --   CKTOTAL  --   --  82  --     Estimated Creatinine Clearance: 33.1 mL/min (A) (by C-G formula based on SCr of 3.04 mg/dL (H)).   Medical History: Past Medical History:  Diagnosis Date   Anemia    Anginal pain (HCC) 04/2013   Cardiac cath showed patent stents with distal LAD, circumflex-OM and RCA disease in small vessels.   Anxiety    Atrial fibrillation (HCC) 12/2021   CAD S/P percutaneous coronary angioplasty 2003, 04/2012   status post PCI to LAD, circumflex-OM 2, RCA   Carotid artery occlusion    Chronic renal insufficiency, stage II (mild)    Chronic venous insufficiency    varicosities, no reflux; dopplers 04/14/12- valvular insufficiency in the R and L GSV   Complication of anesthesia    COPD, mild (HCC)    Depression    situaltional    Diabetes mellitus    Diabetic neuropathy (HCC)  08/24/2019   Dyslipidemia associated with type 2 diabetes mellitus (HCC)    GERD (gastroesophageal reflux disease)    History of cardioversion 12/2021   at [facility]   HTN (hypertension)    Hypothyroidism    Neuromuscular disorder (HCC)    neuropathy in feet   Neuropathy    notably improved following PCI with improved cardiac function   Obesity    Obesity, Class II, BMI 35.0-39.9, with comorbidity (see actual BMI)    BMI 39; wgt loss efforts in place; seeing Dietician   Orthostatic hypotension    PONV (postoperative nausea and vomiting)    "[employer]"   Pulmonary hypertension (HCC) 04/2012   PA pressure   Sleep apnea    uses nightly   Vitamin D deficiency    per facility notes    Medications:  Scheduled:   allopurinol  100 mg Oral Daily   amiodarone  200 mg Oral BID   carvedilol  3.125 mg Oral BID WC   Chlorhexidine Gluconate Cloth  6 each Topical Q0600   clopidogrel  75 mg Oral Q breakfast   docusate sodium  100 mg Oral BID   DULoxetine  60 mg Oral Daily   ezetimibe  10 mg Oral Daily   gabapentin  400 mg Oral TID   guaiFENesin  600  mg Oral BID   insulin aspart  0-9 Units Subcutaneous Q6H   insulin glargine  40 Units Subcutaneous Daily   levothyroxine  88 mcg Oral QAC breakfast   rosuvastatin  20 mg Oral QPM   sodium chloride flush  3 mL Intravenous Q12H   sodium chloride flush  3-10 mL Intravenous Q12H   Infusions:   ceFEPime (MAXIPIME) IV 2 g (04/15/23 2105)   DAPTOmycin Stopped (04/15/23 1441)   heparin 1,650 Units/hr (04/15/23 1919)    Assessment: 68 y/o male with history of afib on apixaban. Now having significant bleeding from CVL site. Pharmacy consulted for heparin.   Eliquis has been held d/t bleeding from CVL site. Bleeding still occurring from site per RN. Per discussion with provider, would like to keep pt on anticoagulation due to afib but would like heparin therapy for now to monitor bleeding. Last eliquis dose was 2/20 PM.   Heparin  level still elevated from DOAC use, aPTT therapeutic at 71 seconds, H/H ok, pltc down slightly.   Goal of Therapy:  Heparin level 0.3-0.7 units/ml aPTT 66-102 seconds Monitor platelets by anticoagulation protocol: Yes   Plan:  Continue heparin at 1650 units/hr Daily aPTT, heparin level, CBC  Fredonia Highland, PharmD, BCPS, [facility] Clinical Pharmacist Please check AMION for all [facility] Pharmacy numbers 04/16/2023

## 2023-04-17 ENCOUNTER — Inpatient Hospital Stay (HOSPITAL_COMMUNITY): Payer: Medicare Other

## 2023-04-17 ENCOUNTER — Other Ambulatory Visit (HOSPITAL_COMMUNITY): Payer: Self-pay

## 2023-04-17 DIAGNOSIS — M86171 Other acute osteomyelitis, right ankle and foot: Secondary | ICD-10-CM | POA: Diagnosis not present

## 2023-04-17 DIAGNOSIS — N184 Chronic kidney disease, stage 4 (severe): Secondary | ICD-10-CM | POA: Diagnosis not present

## 2023-04-17 DIAGNOSIS — I129 Hypertensive chronic kidney disease with stage 1 through stage 4 chronic kidney disease, or unspecified chronic kidney disease: Secondary | ICD-10-CM

## 2023-04-17 DIAGNOSIS — L02415 Cutaneous abscess of right lower limb: Secondary | ICD-10-CM | POA: Diagnosis not present

## 2023-04-17 DIAGNOSIS — J101 Influenza due to other identified influenza virus with other respiratory manifestations: Secondary | ICD-10-CM | POA: Diagnosis not present

## 2023-04-17 DIAGNOSIS — J9690 Respiratory failure, unspecified, unspecified whether with hypoxia or hypercapnia: Secondary | ICD-10-CM

## 2023-04-17 DIAGNOSIS — I4581 Long QT syndrome: Secondary | ICD-10-CM

## 2023-04-17 DIAGNOSIS — M869 Osteomyelitis, unspecified: Secondary | ICD-10-CM

## 2023-04-17 DIAGNOSIS — Z87891 Personal history of nicotine dependence: Secondary | ICD-10-CM

## 2023-04-17 DIAGNOSIS — N179 Acute kidney failure, unspecified: Secondary | ICD-10-CM | POA: Diagnosis not present

## 2023-04-17 DIAGNOSIS — L97314 Non-pressure chronic ulcer of right ankle with necrosis of bone: Secondary | ICD-10-CM | POA: Diagnosis not present

## 2023-04-17 LAB — GLUCOSE, CAPILLARY
Glucose-Capillary: 88 mg/dL (ref 70–99)
Glucose-Capillary: 104 mg/dL — ABNORMAL HIGH (ref 70–99)

## 2023-04-17 LAB — PROTEIN ELECTROPHORESIS, SERUM
A/G Ratio: 1.7 (ref 0.7–1.7)
Albumin ELP: 3.7 g/dL (ref 2.9–4.4)
Alpha-1-Globulin: 0.3 g/dL (ref 0.0–0.4)
Alpha-2-Globulin: 1 g/dL (ref 0.4–1.0)
Beta Globulin: 0.5 g/dL — ABNORMAL LOW (ref 0.7–1.3)
Gamma Globulin: 0.4 g/dL (ref 0.4–1.8)
Globulin, Total: 2.2 g/dL (ref 2.2–3.9)
M-Spike, %: 0.1 g/dL — ABNORMAL HIGH
Total Protein ELP: 5.9 g/dL — ABNORMAL LOW (ref 6.0–8.5)

## 2023-04-17 LAB — KAPPA/LAMBDA LIGHT CHAINS
Kappa free light chain: 62.9 mg/L — ABNORMAL HIGH (ref 3.3–19.4)
Kappa, lambda light chain ratio: 1.87 — ABNORMAL HIGH (ref 0.26–1.65)
Lambda free light chains: 33.6 mg/L — ABNORMAL HIGH (ref 5.7–26.3)

## 2023-04-17 LAB — RENAL FUNCTION PANEL
Albumin: 4.1 g/dL (ref 3.5–5.0)
Anion gap: 11 (ref 5–15)
BUN: 33 mg/dL — ABNORMAL HIGH (ref 8–23)
CO2: 23 mmol/L (ref 22–32)
Calcium: 9.3 mg/dL (ref 8.9–10.3)
Chloride: 108 mmol/L (ref 98–111)
Creatinine, Ser: 3.21 mg/dL — ABNORMAL HIGH (ref 0.61–1.24)
GFR, Estimated: 20 mL/min — ABNORMAL LOW (ref >60)
Glucose, Bld: 124 mg/dL — ABNORMAL HIGH (ref 70–99)
Phosphorus: 3.3 mg/dL (ref 2.5–4.6)
Potassium: 4.5 mmol/L (ref 3.5–5.1)
Sodium: 142 mmol/L (ref 135–145)

## 2023-04-17 LAB — HEPATITIS C ANTIBODY: HCV Ab: NONREACTIVE

## 2023-04-17 LAB — CBC
HCT: 24.1 % — ABNORMAL LOW (ref 39.0–52.0)
Hemoglobin: 8 g/dL — ABNORMAL LOW (ref 13.0–17.0)
MCH: 29.3 pg (ref 26.0–34.0)
MCHC: 33.2 g/dL (ref 30.0–36.0)
MCV: 88.3 fL (ref 80.0–100.0)
Platelets: 109 10*3/uL — ABNORMAL LOW (ref 150–400)
RBC: 2.73 MIL/uL — ABNORMAL LOW (ref 4.22–5.81)
RDW: 16 % — ABNORMAL HIGH (ref 11.5–15.5)
WBC: 8.7 10*3/uL (ref 4.0–10.5)
nRBC: 0 % (ref 0.0–0.2)

## 2023-04-17 LAB — ANA W/REFLEX IF POSITIVE: Anti Nuclear Antibody (ANA): NEGATIVE

## 2023-04-17 LAB — PROCALCITONIN: Procalcitonin: 0.18 ng/mL

## 2023-04-17 LAB — RAPID HIV SCREEN (HIV 1/2 AB+AG)
HIV 1/2 Antibodies: NONREACTIVE
HIV-1 P24 Antigen - HIV24: NONREACTIVE

## 2023-04-17 LAB — ANCA TITERS
Atypical P-ANCA titer: <1:20 {titer}
C-ANCA: <1:20 {titer}
P-ANCA: <1:20 {titer}

## 2023-04-17 LAB — HEPATITIS B SURFACE ANTIGEN: Hepatitis B Surface Ag: NONREACTIVE

## 2023-04-17 LAB — BRAIN NATRIURETIC PEPTIDE: B Natriuretic Peptide: 765.1 pg/mL — ABNORMAL HIGH (ref 0.0–100.0)

## 2023-04-17 MED ORDER — SODIUM CHLORIDE 0.9 % IV SOLN
1.0000 g | Freq: Every day | INTRAVENOUS | Status: DC
Start: 2023-04-17 — End: 2023-04-18
  Administered 2023-04-17 – 2023-04-18 (×2): 1 g via INTRAVENOUS
  Filled 2023-04-17 (×2): qty 1000

## 2023-04-17 MED ORDER — FUROSEMIDE 10 MG/ML IJ SOLN
60.0000 mg | Freq: Once | INTRAMUSCULAR | Status: AC
  Administered 2023-04-17: 60 mg via INTRAVENOUS
  Filled 2023-04-17: qty 6

## 2023-04-17 NOTE — Progress Notes (Signed)
 PHARMACY - ANTICOAGULATION CONSULT NOTE  Pharmacy Consult for heparin Indication: atrial fibrillation  Allergies  Allergen Reactions   Doxycycline Hives and Rash   Septra [Bactrim] Itching   Penicillins Rash    Allergy to All cillin drugs  Ancef given 9/24 with no obvious reaction   Metformin And Related Diarrhea and Nausea Only   Other Nausea And Vomiting    General Anesthesia - sometimes causes nausea and vomiting * OK to use scopolamine patch     Sulfamethoxazole-Trimethoprim Other (See Comments)   Wellbutrin [Bupropion]     Mood Changes    Patient Measurements: Height: 6\' 1"  (185.4 cm) Weight: 135.4 kg (298 lb 8.1 oz) IBW/kg (Calculated) : 79.9 Heparin Dosing Weight: 112.8 kg  Vital Signs: Temp: 98.8 F (37.1 C) (02/27 0515) Temp Source: Oral (02/27 0515) BP: 150/62 (02/27 0515) Pulse Rate: 63 (02/27 0515)  Labs: Recent Labs    04/15/23 0357 04/15/23 1452 04/16/23 0752  HGB 9.1* 9.2* 8.0*  HCT 26.4* 27.0* 24.0*  PLT 146* 168 121*  APTT 82*  --  71*  HEPARINUNFRC 1.04*  --  0.88*  CREATININE 3.04*  --  3.20*  CKTOTAL  --  82  --     Estimated Creatinine Clearance: 31.5 mL/min (A) (by C-G formula based on SCr of 3.2 mg/dL (H)).   Medical History: Past Medical History:  Diagnosis Date   Anemia    Anginal pain (HCC) 04/2013   Cardiac cath showed patent stents with distal LAD, circumflex-OM and RCA disease in small vessels.   Anxiety    Atrial fibrillation (HCC) 12/2021   CAD S/P percutaneous coronary angioplasty 2003, 04/2012   status post PCI to LAD, circumflex-OM 2, RCA   Carotid artery occlusion    Chronic renal insufficiency, stage II (mild)    Chronic venous insufficiency    varicosities, no reflux; dopplers 04/14/12- valvular insufficiency in the R and L GSV   Complication of anesthesia    COPD, mild (HCC)    Depression    situaltional    Diabetes mellitus    Diabetic neuropathy (HCC) 08/24/2019   Dyslipidemia associated with type 2 diabetes  mellitus (HCC)    GERD (gastroesophageal reflux disease)    History of cardioversion 12/2021   at [facility]   HTN (hypertension)    Hypothyroidism    Neuromuscular disorder (HCC)    neuropathy in feet   Neuropathy    notably improved following PCI with improved cardiac function   Obesity    Obesity, Class II, BMI 35.0-39.9, with comorbidity (see actual BMI)    BMI 39; wgt loss efforts in place; seeing Dietician   Orthostatic hypotension    PONV (postoperative nausea and vomiting)    "[employer]"   Pulmonary hypertension (HCC) 04/2012   PA pressure   Sleep apnea    uses nightly   Vitamin D deficiency    per facility notes    Medications:  Scheduled:   allopurinol  100 mg Oral Daily   amiodarone  200 mg Oral BID   carvedilol  3.125 mg Oral BID WC   Chlorhexidine Gluconate Cloth  6 each Topical Q0600   clopidogrel  75 mg Oral Q breakfast   docusate sodium  100 mg Oral BID   DULoxetine  60 mg Oral Daily   ezetimibe  10 mg Oral Daily   gabapentin  400 mg Oral TID   guaiFENesin  600 mg Oral BID   insulin aspart  0-9 Units Subcutaneous Q6H  insulin glargine  40 Units Subcutaneous Daily   levothyroxine  88 mcg Oral QAC breakfast   rosuvastatin  20 mg Oral QPM   sodium chloride flush  3 mL Intravenous Q12H   sodium chloride flush  3-10 mL Intravenous Q12H   Infusions:   ceFEPime (MAXIPIME) IV 2 g (04/16/23 2211)   DAPTOmycin Stopped (04/16/23 1454)    Assessment: 68 y/o male with history of afib on apixaban. Now having significant bleeding from CVL site. Pharmacy consulted for heparin.   Eliquis has been held d/t bleeding from CVL site. Bleeding still occurring from site per RN. Per discussion with provider, would like to keep pt on anticoagulation due to afib but would like heparin therapy for now to monitor bleeding. Last eliquis dose was 2/20 PM.   Heparin on hold this morning with new thigh pain, CT ordered.  Goal of Therapy:  Heparin level 0.3-0.7  units/ml aPTT 66-102 seconds Monitor platelets by anticoagulation protocol: Yes   Plan:  Hold heparin for now  Fredonia Highland, PharmD, BCPS, [facility] Clinical Pharmacist Please check AMION for all [facility] Pharmacy numbers 04/17/2023

## 2023-04-17 NOTE — Progress Notes (Signed)
 Call placed to radiology reading room to inquire about results of pt's portable CXR-- no written results in EPIC.

## 2023-04-17 NOTE — Progress Notes (Signed)
 Occupational Therapy Treatment Patient Details Name: Harold [name] MRN: [id number] DOB: Mar 03, 1955 Today's Date: 04/17/2023   History of present illness 68 y.o. male [facility]27[facility]23 with productive cough and generalized weakness. Admitted with influenza A and cellulitis of R foot s/p hardware removal of the R ankle and application of wound vac on 2/10. Prior admit 11/24 for R ankle ORIF and in 12/24 for revision. S/p excisional debridement of right ankle on 2/12. Pt underwent central line placement on 04/07/2023 however he has been having issues with recurrent bleeding from the insertion site which has finally stopped. Significant PMH: HTN, HLD, CAD s/p PCI, COPD, DM2, CKD stage IIIb, PVD, gout, OSA, neuropathy, COPD, a-fib, CAD s/p percutaneous coronary angioplasty, angina, obesity   OT comments  This 68 yo male seen today with intent on trying to get him up and ambulating to progress towards home discharge. Upon entering room he said not today, that his breathing was bad and his LLE thigh area was painful with moving it. He was agreeable to sitting up on EOB, getting gown changed, and scooting up to EOB. He did report dizziness when first sitting up but this dissipated within 4 minutes and BP increased as well. Pt will continue to benefit from acute OT with follow up HHOT. Goals updated today.      If plan is discharge home, recommend the following:  A little help with walking and/or transfers;A lot of help with bathing/dressing/bathroom;Assistance with cooking/housework;Help with stairs or ramp for entrance;Assist for transportation   Equipment Recommendations  Other (comment) (bariatric BSC)       Precautions / Restrictions Precautions Precautions: Fall Recall of Precautions/Restrictions: Impaired Precaution/Restrictions Comments: Watch BP; wound vac RLE Required Braces or Orthoses: Other Brace Other Brace: R CAM boot for ambulation, R splint in bed, abdominal  binder Restrictions Weight Bearing Restrictions Per Provider Order: Yes RLE Weight Bearing Per Provider Order: Weight bearing as tolerated Other Position/Activity Restrictions: per Dr. Audrie Lia note 04/03/2023 and orders 04/03/2023       Mobility Bed Mobility Overal bed mobility: Needs Assistance Bed Mobility: Supine to Sit     Supine to sit: HOB elevated, Used rails, Mod assist Sit to supine: Total assist   General bed mobility comments: modA for trunk management and pad scoot for pt to come to seated EoB with pt use of bedrail, pt requires total A for return of LE to bed and for centering torso in bed    Transfers Overall transfer level: Needs assistance                Lateral/Scoot Transfers: Mod assist, +2 physical assistance General transfer comment: pt requires modAx2 for scooting along EoB to postion himself up in bed to return to supine     Balance Overall balance assessment: Needs assistance Sitting-balance support: No upper extremity supported, Feet supported Sitting balance-Leahy Scale: Fair Sitting balance - Comments: statically                                      Extremity/Trunk Assessment Upper Extremity Assessment Upper Extremity Assessment: Overall WFL for tasks assessed;Right hand dominant;LUE deficits/detail LUE Deficits / Details: pt's partner was cutting his finger nails and cut pt so his finger would not stop bleeding and thus it is wrapped in curlex            Vision Baseline Vision/History: 1 Wears glasses Patient Visual Report: No  change from baseline           Communication Communication Communication: No apparent difficulties;Impaired   Cognition Arousal: Alert Behavior During Therapy: Flat affect                                 Following commands: Intact        Cueing   Cueing Techniques: Verbal cues, Tactile cues        General Comments BP continues to drop with positional change but does  rebound with 4 minutes of sitting (supine 138/60 (84) Seated 109/65 (79) Seated 123/63 (81) after sitting 4 min Supine 140/60 (84)), pt with asterixis in bilateral UE likely due to AKI.    Pertinent Vitals/ Pain       Pain Assessment Pain Assessment: 0-10 Pain Score: 9  Pain Location: L quadriceps with flexion Pain Descriptors / Indicators: Discomfort, Guarding Pain Intervention(s): Limited activity within patient's tolerance, Monitored during session (pt to have CT of femur to see if a cause can be determined)         Frequency  Min 1X/week        Progress Toward Goals  OT Goals(current goals can now be found in the care plan section)  Progress towards OT goals: Not progressing toward goals - comment (due to pain in his left quadraceps)  Acute Rehab OT Goals Patient Stated Goal: pain in LLE to be better and to go home OT Goal Formulation: With patient Time For Goal Achievement: 05/01/23 Potential to Achieve Goals: Good  Plan      Co-evaluation    PT/OT/SLP Co-Evaluation/Treatment: Yes Reason for Co-Treatment: To address functional/ADL transfers;For patient/therapist safety;Other (comment) (activity tolerance) PT goals addressed during session: Mobility/safety with mobility;Balance OT goals addressed during session: ADL's and self-care      AM-PAC OT "6 Clicks" Daily Activity     Outcome Measure   Help from another person eating meals?: None Help from another person taking care of personal grooming?: A Little (setup) Help from another person toileting, which includes using toliet, bedpan, or urinal?: A Lot Help from another person bathing (including washing, rinsing, drying)?: A Lot Help from another person to put on and taking off regular upper body clothing?: A Little (setup) Help from another person to put on and taking off regular lower body clothing?: Total 6 Click Score: 15    End of Session    OT Visit Diagnosis: Other abnormalities of gait and mobility  (R26.89);Muscle weakness (generalized) (M62.81);Pain Pain - Right/Left: Left Pain - part of body:  (quadraceps)   Activity Tolerance Patient limited by pain   Patient Left in bed;with call bell/phone within reach;with bed alarm set           Time: 1610-9604 OT Time Calculation (min): 26 min  Charges: OT General Charges $OT Visit: 1 Visit OT Treatments $Therapeutic Activity: 8-22 mins  Lindon Romp OT Acute Rehabilitation Services Office [phone]    Evette Georges 04/17/2023, 2:55 PM

## 2023-04-17 NOTE — Progress Notes (Signed)
 Pt called out c/o wheezing, upon assessment pt has rales and rhonchi bilat, feels some SOB.  Pt encouraged to take deep breaths and cough, cough non -productive.  Secure message sent to Dr Isidoro Donning.    Placed on O2 sat monitoring, sats 85-86% RA, placed on 2L/[place] sats up to 90-92%.    Dr Isidoro Donning in to see pt and assessed lung fields, PCXR ordered.  Pt informed MD that he has had tremors of hands/arms and occasionally his legs for the past few days.    With O2 on pt states his SOB is much better.

## 2023-04-17 NOTE — Progress Notes (Incomplete)
 PHARMACY CONSULT NOTE FOR:  OUTPATIENT  PARENTERAL ANTIBIOTIC THERAPY (OPAT)  Indication: R-ankle osteo Regimen: Daptomycin 700 mg IV every 24 hours + ***Cefepime 2g IV every 12 hours End date: 05/12/23  IV every 24 hour discharge orders are pended. To discharging provider:  please sign these orders via discharge navigator,  Select New Orders & click on the button choice - Manage This Unsigned Work.     Thank you for allowing pharmacy to be a part of this patient's care.  Georgina Pillion, PharmD, BCPS, BCIDP Infectious Diseases Clinical Pharmacist 04/17/2023 1:55 PM   **Pharmacist phone directory can now be found on [email or web address] ([email or web address]).  Listed under [facility].

## 2023-04-17 NOTE — Progress Notes (Signed)
   04/17/23 0006  BiPAP/CPAP/SIPAP  BiPAP/CPAP/SIPAP Pt Type Adult  Reason BIPAP/CPAP not in use Non-compliant (refused)  Patient Home Equipment No  Auto Titrate No

## 2023-04-17 NOTE — Progress Notes (Signed)
 Results for P-CXR in EPIC, results secured messaged to Dr Isidoro Donning.  IMPRESSION: Bilateral perihilar and basilar opacities are noted concerning for edema or possibly pneumonia.

## 2023-04-17 NOTE — Progress Notes (Signed)
 [facility]                                                                              Harold [name], is a 68 y.o. male, DOB - 1955-12-06, BMW:[id number] Admit date - 03/27/2023    Outpatient Primary MD for the patient is [name], MD  LOS - 21  days  Chief Complaint  Patient presents with   Cough       Brief summary   Patient is a 68 year old male with morbid obesity, OSA, DM2, HTN, HLD, CAD/stent, A-fib on Eliquis, carotid artery stenosis, CKD, chronic anemia, anxiety/depression, diabetic neuropathy, GERD, hypothyroidism, COPD, pulmonary hypertension. 2/6, patient presented to the ED with complaint of cough, subjective fever, myalgia, generalized weakness for 5 days. Respiratory virus panel positive for influenza A. Chest x-ray negative for infiltrates. Also noted to have right leg cellulitis.  Assessment & Plan     Right leg wound infection/osteomyelitis right ankle -Prior right ankle surgery (12/2022) complicated by postsurgical wound infection. -S/p right ankle hardware removal, I&D and wound VAC placement on 2/10 by Dr. Jena Gauss -S/p repeat debridement of the right ankle by Dr. Lajoyce Corners 04/02/2023.   -ID consulted and recommended continuing current cefepime and daptomycin for 4 weeks since surgery EOT 05/12/2023 with weekly CK as outpatient. -PT recommended SNF however patient will have to pay out-of-pocket and unable to afford. -Has wound VAC -ID reconsulted to change antibiotics due to concern for possible AIN from cefepime causing AKI, plan to change to daptomycin and ertapenem     AKI on CKD stage IIIb:  -Baseline creatinine appears to be 1.9-2.2.   -Creatinine peaked to 3.1 on 2/19 that started to trend up after increasing lasix dose to 80mg  -Lasix placed on hold however creatinine continues to trend up.  Nephrology following -Has received IV albumin, noted to be congested, elevated BNP 765.1.  Chest x-ray showed bilateral perihilar and basilar  opacities, edema versus pneumonia. -Nephrology following, plan for renal biopsy in a.m.   PAD -Seen by vascular surgery underwent aortogram, right leg angiogram, successful stenting of right superficial femoral artery on 04/01/2023.   -Patient on Plavix and heparin gtt  -Holding IV heparin drip.  Patient complaining of left thigh pain, area of tenderness noted above knee.  CT of the left femur without contrast pending to rule out hematoma   Acute diarrhea Resolved   Influenza A:  - Presented with 5 days of cough, fever, myalgia.  - Respiratory virus panel positive for influenza A. Chest x-ray without acute infiltrate -Improved with symptomatic treatment, was outside 48-hour window for Tamiflu hence was not initiated.    Type 2 diabetes mellitus / Diabetic neuropathy, POA: A1c 7 on 01/02/2023. PTA meds-Tresiba 60 units daily, Mounjaro  CBG (last 3)  Recent Labs    04/16/23 2352 04/17/23 0519 04/17/23 1134  GLUCAP 104* 88 104*    -Currently on Semglee to 40 units daily, sliding scale insulin sensitive  Diabetic neuropathy -PTA reportedly on gabapentin 1600 mg 3 times daily. Since dose was unable to be confirmed, pt was continued on 400 mg 3 times daily instead   Hypokalemia/hypomagnesemia: Improved   Paroxysmal  A-fib Rates controlled on PTA meds Coreg, amiodarone  -CHADS-VASc of 4 -Eliquis held secondary to bleeding from CVL recently, now on heparin gtt, currently on hold   Essential hypertension -PTA meds- Coreg 3.125 mg twice daily, Lasix 160 mg daily -Currently on Coreg, amlodipine.  Lasix held due to AKI   Anemia of chronic disease: Baseline hemoglobin between 10.5 11.5.   -Hgb remains stable. Bleeding recently noted at CVL site, eliquis held for now -Heparin drip currently on hold.  H&H stable 8.0   CAD/stent Carotid artery stenosis HLD Continue PTA meds -Plavix, Crestor -Eliquis held for now, continue heparin gtt and monitor for bleed    Hypothyroidism Continue Synthroid   Obstructive sleep apnea Patient reported good compliance on home nocturnal CPAP   Gout Continue allopurinol, colchicine   Depression Continue Cymbalta     Orthostatic hypotension -Received brief IV fluids, continue abdominal binder -2D echo showed EF 55 to 60%, basal inferior hypokinesis, G2 DD    Moderate obesity, class II Estimated body mass index is 39.38 kg/m as calculated from the following:   Height as of this encounter: 6\' 1"  (1.854 m).   Weight as of this encounter: 135.4 kg.  Code Status: Full code DVT Prophylaxis:  Place and maintain sequential compression device Start: 04/15/23 1549 SCDs Start: 04/02/23 1617 SCDs Start: 03/31/23 1620 SCDs Start: 03/27/23 2243   Level of Care: Level of care: Progressive Cardiac Family Communication: Updated patient Disposition Plan:      Remains inpatient appropriate:      Procedures:  2D echo  Consultants:    Antimicrobials:   Anti-infectives (From admission, onward)    Start     Dose/Rate Route Frequency Ordered Stop   04/17/23 1800  ertapenem (INVANZ) 1 g in sodium chloride 0.9 % 100 mL IVPB        1 g 200 mL/hr over 30 Minutes Intravenous Daily 04/17/23 1354     04/08/23 0000  daptomycin (CUBICIN) IVPB  Status:  Discontinued        700 mg Intravenous Every 24 hours 04/08/23 1147 04/08/23    04/08/23 0000  ceFEPime (MAXIPIME) IVPB        2 g Intravenous Every 12 hours 04/08/23 1147 05/12/23 2359   04/08/23 0000  daptomycin (CUBICIN) IVPB        700 mg Intravenous Every 24 hours 04/08/23 1150 05/12/23 2359   04/04/23 1400  DAPTOmycin (CUBICIN) IVPB 700 mg/163mL premix        700 mg 200 mL/hr over 30 Minutes Intravenous Daily 04/03/23 1435     04/02/23 1715  ceFAZolin (ANCEF) IVPB 2g/100 mL premix  Status:  Discontinued        2 g 200 mL/hr over 30 Minutes Intravenous Every 8 hours 04/02/23 1616 04/02/23 1623   04/02/23 0600  ceFAZolin (ANCEF) IVPB 3g/150 mL premix  Status:   Discontinued        3 g 300 mL/hr over 30 Minutes Intravenous On call to O.R. 04/01/23 1700 04/02/23 1549   03/31/23 1015  ceFAZolin (ANCEF) IVPB 2g/100 mL premix        2 g 200 mL/hr over 30 Minutes Intravenous On call to O.R. 03/31/23 0921 03/31/23 1203   03/29/23 2200  vancomycin (VANCOREADY) IVPB 2000 mg/400 mL  Status:  Discontinued       Placed in "Followed by" Linked Group   2,000 mg 200 mL/hr over 120 Minutes Intravenous Every 48 hours 03/27/23 2257 04/03/23 1435   03/28/23 1045  metroNIDAZOLE (FLAGYL) tablet  500 mg  Status:  Discontinued        500 mg Oral Every 12 hours 03/28/23 0955 04/03/23 0945   03/27/23 2330  vancomycin (VANCOCIN) 2,500 mg in sodium chloride 0.9 % 500 mL IVPB       Placed in "Followed by" Linked Group   2,500 mg 262.5 mL/hr over 120 Minutes Intravenous  Once 03/27/23 2257 03/28/23 0241   03/27/23 2300  metroNIDAZOLE (FLAGYL) IVPB 500 mg  Status:  Discontinued        500 mg 100 mL/hr over 60 Minutes Intravenous Every 12 hours 03/27/23 2247 03/28/23 0955   03/27/23 2300  ceFEPIme (MAXIPIME) 2 g in sodium chloride 0.9 % 100 mL IVPB  Status:  Discontinued        2 g 200 mL/hr over 30 Minutes Intravenous Every 12 hours 03/27/23 2257 04/17/23 1354   03/27/23 2100  ceFAZolin (ANCEF) IVPB 2g/100 mL premix        2 g 200 mL/hr over 30 Minutes Intravenous  Once 03/27/23 2058 03/27/23 2219          Medications  allopurinol  100 mg Oral Daily   amiodarone  200 mg Oral BID   carvedilol  3.125 mg Oral BID WC   Chlorhexidine Gluconate Cloth  6 each Topical Q0600   clopidogrel  75 mg Oral Q breakfast   docusate sodium  100 mg Oral BID   DULoxetine  60 mg Oral Daily   ezetimibe  10 mg Oral Daily   gabapentin  400 mg Oral TID   guaiFENesin  600 mg Oral BID   insulin aspart  0-9 Units Subcutaneous Q6H   insulin glargine  40 Units Subcutaneous Daily   levothyroxine  88 mcg Oral QAC breakfast   rosuvastatin  20 mg Oral QPM   sodium chloride flush  3 mL  Intravenous Q12H   sodium chloride flush  3-10 mL Intravenous Q12H      Subjective:   Harold [name] was seen and examined today.  Complaining of shortness of breath, feeling congested, left thigh pain.  No acute chest pain, fevers or chills, cough.   Objective:   Vitals:   04/17/23 0804 04/17/23 0945 04/17/23 0950 04/17/23 1054  BP: 125/60   (!) 159/71  Pulse:   65 67  Resp: 18 (!) 22  20  Temp:      TempSrc:      SpO2:  (!) 85% 91% 96%  Weight:      Height:        Intake/Output Summary (Last 24 hours) at 04/17/2023 1508 Last data filed at 04/16/2023 1930 Gross per 24 hour  Intake 1535.71 ml  Output 0 ml  Net 1535.71 ml     Wt Readings from Last 3 Encounters:  04/17/23 135.4 kg  01/28/23 (!) 142.9 kg  01/09/23 (!) 145 kg    Physical Exam General: Alert and oriented x 3, NAD Cardiovascular: S1 S2 clear, RRR.  Respiratory: Scattered rhonchi with bibasilar crackles Gastrointestinal: Soft, nontender, nondistended, NBS Ext: trace pedal edema bilaterally Neuro: no new deficits Psych: Normal affect     Data Reviewed:  I have personally reviewed following labs    CBC Lab Results  Component Value Date   WBC 8.7 04/17/2023   RBC 2.73 (L) 04/17/2023   HGB 8.0 (L) 04/17/2023   HCT 24.1 (L) 04/17/2023   MCV 88.3 04/17/2023   MCH 29.3 04/17/2023   PLT 109 (L) 04/17/2023   MCHC 33.2 04/17/2023   RDW 16.0 (  H) 04/17/2023   LYMPHSABS 0.9 04/15/2023   MONOABS 0.6 04/15/2023   EOSABS 0.3 04/15/2023   BASOSABS 0.1 04/15/2023     Last metabolic panel Lab Results  Component Value Date   NA 142 04/17/2023   K 4.5 04/17/2023   CL 108 04/17/2023   CO2 23 04/17/2023   BUN 33 (H) 04/17/2023   CREATININE 3.21 (H) 04/17/2023   GLUCOSE 124 (H) 04/17/2023   GFRNONAA 20 (L) 04/17/2023   GFRAA 47 (L) 09/28/2019   CALCIUM 9.3 04/17/2023   PHOS 3.3 04/17/2023   PROT 5.8 (L) 04/16/2023   ALBUMIN 4.1 04/17/2023   LABGLOB 2.2 05/12/2018   AGRATIO 1.9 05/12/2018    BILITOT 0.6 04/16/2023   ALKPHOS 73 04/16/2023   AST 50 (H) 04/16/2023   ALT 42 04/16/2023   ANIONGAP 11 04/17/2023    CBG (last 3)  Recent Labs    04/16/23 2352 04/17/23 0519 04/17/23 1134  GLUCAP 104* 88 104*      Coagulation Profile: No results for input(s): "INR", "PROTIME" in the last 168 hours.   Radiology Studies: I have personally reviewed the imaging studies  DG CHEST PORT 1 VIEW Result Date: 04/17/2023 CLINICAL DATA:  Shortness of breath, cough. EXAM: PORTABLE CHEST 1 VIEW COMPARISON:  March 27, 2023. FINDINGS: Stable cardiomediastinal silhouette. Increased bilateral perihilar and basilar opacities are noted concerning for edema or possibly infiltrate. Right internal jugular catheter is noted. Bony thorax is unremarkable. IMPRESSION: Bilateral perihilar and basilar opacities are noted concerning for edema or possibly pneumonia. Electronically Signed   By: Lupita Raider M.D.   On: 04/17/2023 11:24   ECHOCARDIOGRAM COMPLETE Result Date: 04/16/2023    ECHOCARDIOGRAM REPORT   Patient Name:   Harold [name] Date of Exam: 04/16/2023 Medical Rec #:  [id number]           Height:       73.0 in Accession #:    [id number]          Weight:       298.7 lb Date of Birth:  [birth year]           BSA:          2.552 m Patient Age:    67 years            BP:           149/65 mmHg Patient Gender: M                   HR:           63 bpm. Exam Location:  Inpatient Procedure: 2D Echo, Color Doppler and Cardiac Doppler (Both Spectral and Color            Flow Doppler were utilized during procedure). Indications:    acute diastolic chf  History:        Patient has prior history of Echocardiogram examinations, most                 recent 02/21/2022. CAD, chronic kidney disease and PAD; Risk                 Factors:Hypertension, Diabetes, Dyslipidemia and Sleep Apnea.  Sonographer:    Delcie Roch RDCS Referring Phys: [id number] STEPHEN K CHIU IMPRESSIONS  1. Left ventricular ejection fraction, by  estimation, is 55 to 60%. The left ventricle has normal function. The left ventricle demonstrates regional wall motion abnormalities with basal inferior hypokinesis. The left ventricular internal cavity size was  mildly dilated. Left ventricular diastolic parameters are consistent with Grade II diastolic dysfunction (pseudonormalization).  2. Right ventricular systolic function is normal. The right ventricular size is mildly enlarged. There is mildly elevated pulmonary artery systolic pressure. The estimated right ventricular systolic pressure is 38.3 mmHg.  3. Left atrial size was moderately dilated.  4. Right atrial size was mildly dilated.  5. The mitral valve is normal in structure. Trivial mitral valve regurgitation. No evidence of mitral stenosis.  6. The aortic valve is tricuspid. There is mild calcification of the aortic valve. Aortic valve regurgitation is not visualized. No aortic stenosis is present.  7. The inferior vena cava is normal in size with greater than 50% respiratory variability, suggesting right atrial pressure of 3 mmHg. FINDINGS  Left Ventricle: Left ventricular ejection fraction, by estimation, is 55 to 60%. The left ventricle has normal function. The left ventricle demonstrates regional wall motion abnormalities. The left ventricular internal cavity size was mildly dilated. There is no left ventricular hypertrophy. Left ventricular diastolic parameters are consistent with Grade II diastolic dysfunction (pseudonormalization). Right Ventricle: The right ventricular size is mildly enlarged. No increase in right ventricular wall thickness. Right ventricular systolic function is normal. There is mildly elevated pulmonary artery systolic pressure. The tricuspid regurgitant velocity is 2.97 m/s, and with an assumed right atrial pressure of 3 mmHg, the estimated right ventricular systolic pressure is 38.3 mmHg. Left Atrium: Left atrial size was moderately dilated. Right Atrium: Right atrial size  was mildly dilated. Pericardium: There is no evidence of pericardial effusion. Mitral Valve: The mitral valve is normal in structure. Mild mitral annular calcification. Trivial mitral valve regurgitation. No evidence of mitral valve stenosis. Tricuspid Valve: The tricuspid valve is normal in structure. Tricuspid valve regurgitation is trivial. Aortic Valve: The aortic valve is tricuspid. There is mild calcification of the aortic valve. Aortic valve regurgitation is not visualized. No aortic stenosis is present. Pulmonic Valve: The pulmonic valve was normal in structure. Pulmonic valve regurgitation is not visualized. Aorta: The aortic root is normal in size and structure. Venous: The inferior vena cava is normal in size with greater than 50% respiratory variability, suggesting right atrial pressure of 3 mmHg. IAS/Shunts: No atrial level shunt detected by color flow Doppler.  LEFT VENTRICLE PLAX 2D LVIDd:         6.30 cm   Diastology LVIDs:         4.80 cm   LV e' medial:    7.94 cm/s LV PW:         1.20 cm   LV E/e' medial:  16.8 LV IVS:        1.20 cm   LV e' lateral:   10.00 cm/s LVOT diam:     2.40 cm   LV E/e' lateral: 13.3 LV SV:         99 LV SV Index:   39 LVOT Area:     4.52 cm  RIGHT VENTRICLE             IVC RV Basal diam:  3.00 cm     IVC diam: 1.70 cm RV S prime:     12.80 cm/s TAPSE (M-mode): 2.3 cm LEFT ATRIUM              Index        RIGHT ATRIUM           Index LA diam:        5.20 cm  2.04 cm/m   RA Area:  19.30 cm LA Vol (A2C):   125.0 ml 48.99 ml/m  RA Volume:   51.10 ml  20.03 ml/m LA Vol (A4C):   106.0 ml 41.54 ml/m LA Biplane Vol: 123.0 ml 48.20 ml/m  AORTIC VALVE LVOT Vmax:   96.10 cm/s LVOT Vmean:  64.300 cm/s LVOT VTI:    0.219 m  AORTA Ao Root diam: 3.40 cm Ao Asc diam:  3.60 cm MITRAL VALVE                TRICUSPID VALVE MV Area (PHT): 3.72 cm     TR Peak grad:   35.3 mmHg MV Decel Time: 204 msec     TR Vmax:        297.00 cm/s MV E velocity: 133.00 cm/s MV A velocity: 50.10  cm/s   SHUNTS MV E/A ratio:  2.65         Systemic VTI:  0.22 m                             Systemic Diam: 2.40 cm Dalton McleanMD Electronically signed by Wilfred Lacy Signature Date/Time: 04/16/2023/2:24:56 PM    Final    US RENAL Result Date: 04/16/2023 CLINICAL DATA:  Acute kidney injury EXAM: RENAL / URINARY TRACT ULTRASOUND COMPLETE COMPARISON:  None Available. FINDINGS: Right Kidney: Renal measurements: 12.9 x 6.9 x 6.3 cm = volume: 293 mL. Echogenicity within normal limits. No mass or hydronephrosis visualized. Left Kidney: Renal measurements: 13.0 x 6.2 x 5.5 cm = volume: 231 mL. Echogenicity within normal limits. No mass or hydronephrosis visualized. Bladder: Appears normal for degree of bladder distention. Other: None. IMPRESSION: Negative renal ultrasound. Electronically Signed   By: Charline Bills M.D.   On: 04/16/2023 02:42       Harold [name] M.D. [facility] 04/17/2023, 3:08 PM  Available via Epic secure chat 7am-7pm After 7 pm, please refer to night coverage provider listed on amion.

## 2023-04-17 NOTE — Progress Notes (Signed)
 Physical Therapy Treatment Patient Details Name: Harold [name] MRN: [id number] DOB: 1955/08/03 Today's Date: 04/17/2023   History of Present Illness 68 y.o. male presents to [facility] 03/27/23 with productive cough and generalized weakness. Admitted with influenza A and cellulitis of R foot s/p hardware removal of the R ankle and application of wound vac on 2/10. Prior admit 11/24 for R ankle ORIF and in 12/24 for revision. S/p excisional debridement of right ankle on 2/12. Pt underwent central line placement on 04/07/2023 however he has been having issues with recurrent bleeding from the insertion site which has finally stopped. Significant PMH: HTN, HLD, CAD s/p PCI, COPD, DM2, CKD stage IIIb, PVD, gout, OSA, neuropathy, COPD, a-fib, CAD s/p percutaneous coronary angioplasty, angina, obesity    PT Comments  STAR PT/OT Session: Pt with bilateral opacities of lungs and increased O2 demand today, currently requiring 2L O2 via [place] to maintain SpO2 >90%O2. Pt also experiencing 9/10 pain in L quadriceps with knee flexion. Pt is also experiencing increased asterixis in B UE.Pt initially refusing therapy but agreeable to get to EoB and scoot up in bed so that HoB can be elevated for favorable lung positioning with HoB maximally elevated. Pt experience symptomatic orthostatic response to position change with coming to EoB but BP rebounds in ~4 min at which time pt is asymptomatic.  Pt is modA for coming to EoB, modAx2 for scooting along EoB and total A for return to bed.  Pt goals have been reviewed and updated. Continue to work towards pt goal of discharge home. PT will continue to follow acutely.   Orthostatic BPs  Supine 138/60 (84)  Sitting 109/65 (79)  Sitting after 4 min 123/63 (81)  Return to supine 140/60 (84)      If plan is discharge home, recommend the following: A lot of help with walking and/or transfers;A lot of help with bathing/dressing/bathroom;Assistance with cooking/housework;Assist for  transportation;Help with stairs or ramp for entrance   Can travel by private vehicle     Yes  Equipment Recommendations  BSC/3in1 (Bariatric; Pt has a tub bench, w/c, RW, and sliding board already)       Precautions / Restrictions Precautions Precautions: Fall Recall of Precautions/Restrictions: Impaired Precaution/Restrictions Comments: Watch BP; wound vac RLE Required Braces or Orthoses: Other Brace Other Brace: R CAM boot for ambulation, R splint in bed, abdominal binder Restrictions Weight Bearing Restrictions Per Provider Order: Yes RLE Weight Bearing Per Provider Order: Weight bearing as tolerated Other Position/Activity Restrictions: per Dr. Audrie Lia note 04/03/2023,     Mobility  Bed Mobility Overal bed mobility: Needs Assistance Bed Mobility: Supine to Sit     Supine to sit: HOB elevated, Used rails, Mod assist Sit to supine: Total assist   General bed mobility comments: modA for trunk management and pad scoot for pt to come to seated EoB with pt use of bedrail, pt requires total A for return of LE to bed and for centering torso in bed    Transfers Overall transfer level: Needs assistance                Lateral/Scoot Transfers: Mod assist, +2 physical assistance General transfer comment: pt requires modAx2 for scooting along EoB to postion himself up in bed to return to supine    Ambulation/Gait               General Gait Details: deferred due pain in L quadriceps       Balance Overall balance assessment: Needs assistance Sitting-balance support:  No upper extremity supported, Feet supported Sitting balance-Leahy Scale: Fair Sitting balance - Comments: statically                                    Communication Communication Communication: No apparent difficulties;Impaired  Cognition Arousal: Alert Behavior During Therapy: Flat affect   PT - Cognitive impairments: Memory, Safety/Judgement, Problem solving                        PT - Cognition Comments: deficits with memory, word finding, clarity of speech Following commands: Intact      Cueing Cueing Techniques: Verbal cues, Tactile cues     General Comments General comments (skin integrity, edema, etc.): BP continues to drop with positional change but does rebound with 4 minutes of sitting, pt with asterixis, in bilateral UE, likely due to AKI      Pertinent Vitals/Pain Pain Assessment Pain Assessment: 0-10 Pain Score: 9  Pain Location: L quadriceps with flexion Pain Descriptors / Indicators: Discomfort, Guarding Pain Intervention(s): Limited activity within patient's tolerance, Monitored during session, Other (comment) (pt to have CT to determine cause)     PT Goals (current goals can now be found in the care plan section) Acute Rehab PT Goals Patient Stated Goal: Return home confidently PT Goal Formulation: With patient Time For Goal Achievement: 04/17/23 Potential to Achieve Goals: Good Progress towards PT goals: Not progressing toward goals - comment    Frequency    Min 1X/week           Co-evaluation PT/OT/SLP Co-Evaluation/Treatment: Yes Reason for Co-Treatment: To address functional/ADL transfers;For patient/therapist safety;Other (comment) (act tolerance) PT goals addressed during session: Mobility/safety with mobility;Balance;Proper use of DME OT goals addressed during session: ADL's and self-care      AM-PAC PT "6 Clicks" Mobility   Outcome Measure  Help needed turning from your back to your side while in a flat bed without using bedrails?: A Little Help needed moving from lying on your back to sitting on the side of a flat bed without using bedrails?: A Lot Help needed moving to and from a bed to a chair (including a wheelchair)?: A Lot Help needed standing up from a chair using your arms (e.g., wheelchair or bedside chair)?: A Lot Help needed to walk in hospital room?: Total Help needed climbing 3-5 steps with  a railing? : Total 6 Click Score: 11    End of Session   Activity Tolerance: Patient limited by pain Patient left: in bed;with call bell/phone within reach;with bed alarm set   PT Visit Diagnosis: Other abnormalities of gait and mobility (R26.89);Unsteadiness on feet (R26.81);Muscle weakness (generalized) (M62.81);History of falling (Z91.81);Difficulty in walking, not elsewhere classified (R26.2)     Time: 6213-0865 PT Time Calculation (min) (ACUTE ONLY): 26 min  Charges:    $Therapeutic Activity: 8-22 mins PT General Charges $$ ACUTE PT VISIT: 1 Visit                     Polo Mcmartin B. Beverely Risen PT, DPT Acute Rehabilitation Services Please use secure chat or  Call Office [phone]    [facility] 04/17/2023, 2:28 PM

## 2023-04-17 NOTE — Progress Notes (Signed)
 Harold [name]    Assessment/ Plan:   AKI on CKD, nonoliguric -Followed at our office for CKD care, baseline Cr in the 2s typically. Suspecting his current AKI is related to episode decreased renal perfusion in the context of orthostatic hypotension. ATN evident on urine sediment -Cr stable today 3.2 fortunately -renal u/s unremarkable. urine with proteinuria/glucosuria, granular casts, 0-5 rbcs+wbcs/hpf. Moderate blood on dipstick however. CK WNL -given concern for AIN and with nephrotic proteinuria, will plan for renal biopsy. Discussed in detail with patient and he is agreeable. Will keep him NPO after midnight and will consult IR for assistance. Will have arkana req form in physical chart -diuresis as below -no indication for renal replacement therapy at this junction -Avoid nephrotoxic medications including NSAIDs and iodinated intravenous contrast exposure unless the latter is absolutely indicated.  Preferred narcotic agents for pain control are hydromorphone, fentanyl, and methadone. Morphine should not be used. Avoid Baclofen and avoid oral sodium phosphate and magnesium citrate based laxatives / bowel preps. Continue strict Input and Output monitoring. Will monitor the patient closely with you and intervene or adjust therapy as indicated by changes in clinical status/labs    Orthostatic hypotension H/o HTN -recommend compression stockings and abd binder for now -orthostats neg 2/26   Edema SOB, acute on chronic dCHF exacerbation -CXR 2/27 with possible pulm edema, images personally reviewed. Possible pna? -albumin stopped, will give a dose of lasix 60mg  IV x 1 dose today. BNP and procal pending  Nephrotic range proteinuria -UPC 9.6g -biopsy as above -secondary/serological work up pending   RLE infection Hardware infection -s/p I&D, hardware removal 2/10, debridement 2/12 -on cefepime & dapto x 4 weeks   Afib -on hep gtt currently   DM2 -per  primary   Anemia due to chronic disease, thrombocytopenia -Transfuse for Hgb<7 g/dL. Hgb trending down. W/u per primary service esp since he is on a hep gtt   Acidosis -mild, likely related to AKI, monitor for now  Discussed with primary service.  Subjective:   Patient seen and examined bedside. Felt queasy, SOB, unable to catch breath when he woke up this AM.    Objective:   BP (!) 159/71   Pulse 67   Temp 98.6 F (37 C) (Oral)   Resp 20   Ht 6\' 1"  (1.854 m)   Wt 135.4 kg   SpO2 96% Comment: pt resting/sleeping  BMI 39.38 kg/m   Intake/Output Summary (Last 24 hours) at 04/17/2023 1148 Last data filed at 04/16/2023 1930 Gross per 24 hour  Intake 1535.71 ml  Output 0 ml  Net 1535.71 ml   Weight change: -0.1 kg  Physical Exam: Gen: NAD, in bed CVS: RRR Resp: diminished breath sounds bibasilar, slightly labored breathing Abd: soft, obese Ext: minimal/trace LE edema Neuro: awake, alert  Imaging: DG CHEST PORT 1 VIEW Result Date: 04/17/2023 CLINICAL DATA:  Shortness of breath, cough. EXAM: PORTABLE CHEST 1 VIEW COMPARISON:  March 27, 2023. FINDINGS: Stable cardiomediastinal silhouette. Increased bilateral perihilar and basilar opacities are noted concerning for edema or possibly infiltrate. Right internal jugular catheter is noted. Bony thorax is unremarkable. IMPRESSION: Bilateral perihilar and basilar opacities are noted concerning for edema or possibly pneumonia. Electronically Signed   By: Lupita Raider M.D.   On: 04/17/2023 11:24   ECHOCARDIOGRAM COMPLETE Result Date: 04/16/2023    ECHOCARDIOGRAM REPORT   Patient Name:   Harold [name] Date of Exam: 04/16/2023 Medical Rec #:  [id number]  Height:       73.0 in Accession #:    [id number]          Weight:       298.7 lb Date of Birth:  [birth year]           BSA:          2.552 m Patient Age:    68 years            BP:           149/65 mmHg Patient Gender: M                   HR:           63 bpm. Exam  Location:  Inpatient Procedure: 2D Echo, Color Doppler and Cardiac Doppler (Both Spectral and Color            Flow Doppler were utilized during procedure). Indications:    acute diastolic chf  History:        Patient has prior history of Echocardiogram examinations, most                 recent 02/21/2022. CAD, chronic kidney disease and PAD; Risk                 Factors:Hypertension, Diabetes, Dyslipidemia and Sleep Apnea.  Sonographer:    Delcie Roch RDCS Referring Phys: [phone] STEPHEN K CHIU IMPRESSIONS  1. Left ventricular ejection fraction, by estimation, is 55 to 60%. The left ventricle has normal function. The left ventricle demonstrates regional wall motion abnormalities with basal inferior hypokinesis. The left ventricular internal cavity size was  mildly dilated. Left ventricular diastolic parameters are consistent with Grade II diastolic dysfunction (pseudonormalization).  2. Right ventricular systolic function is normal. The right ventricular size is mildly enlarged. There is mildly elevated pulmonary artery systolic pressure. The estimated right ventricular systolic pressure is 38.3 mmHg.  3. Left atrial size was moderately dilated.  4. Right atrial size was mildly dilated.  5. The mitral valve is normal in structure. Trivial mitral valve regurgitation. No evidence of mitral stenosis.  6. The aortic valve is tricuspid. There is mild calcification of the aortic valve. Aortic valve regurgitation is not visualized. No aortic stenosis is present.  7. The inferior vena cava is normal in size with greater than 50% respiratory variability, suggesting right atrial pressure of 3 mmHg. FINDINGS  Left Ventricle: Left ventricular ejection fraction, by estimation, is 55 to 60%. The left ventricle has normal function. The left ventricle demonstrates regional wall motion abnormalities. The left ventricular internal cavity size was mildly dilated. There is no left ventricular hypertrophy. Left ventricular diastolic  parameters are consistent with Grade II diastolic dysfunction (pseudonormalization). Right Ventricle: The right ventricular size is mildly enlarged. No increase in right ventricular wall thickness. Right ventricular systolic function is normal. There is mildly elevated pulmonary artery systolic pressure. The tricuspid regurgitant velocity is 2.97 m/s, and with an assumed right atrial pressure of 3 mmHg, the estimated right ventricular systolic pressure is 38.3 mmHg. Left Atrium: Left atrial size was moderately dilated. Right Atrium: Right atrial size was mildly dilated. Pericardium: There is no evidence of pericardial effusion. Mitral Valve: The mitral valve is normal in structure. Mild mitral annular calcification. Trivial mitral valve regurgitation. No evidence of mitral valve stenosis. Tricuspid Valve: The tricuspid valve is normal in structure. Tricuspid valve regurgitation is trivial. Aortic Valve: The aortic valve is tricuspid. There is mild calcification of the aortic valve.  Aortic valve regurgitation is not visualized. No aortic stenosis is present. Pulmonic Valve: The pulmonic valve was normal in structure. Pulmonic valve regurgitation is not visualized. Aorta: The aortic root is normal in size and structure. Venous: The inferior vena cava is normal in size with greater than 50% respiratory variability, suggesting right atrial pressure of 3 mmHg. IAS/Shunts: No atrial level shunt detected by color flow Doppler.  LEFT VENTRICLE PLAX 2D LVIDd:         6.30 cm   Diastology LVIDs:         4.80 cm   LV e' medial:    7.94 cm/s LV PW:         1.20 cm   LV E/e' medial:  16.8 LV IVS:        1.20 cm   LV e' lateral:   10.00 cm/s LVOT diam:     2.40 cm   LV E/e' lateral: 13.3 LV SV:         99 LV SV Index:   39 LVOT Area:     4.52 cm  RIGHT VENTRICLE             IVC RV Basal diam:  3.00 cm     IVC diam: 1.70 cm RV S prime:     12.80 cm/s TAPSE (M-mode): 2.3 cm LEFT ATRIUM              Index        RIGHT ATRIUM            Index LA diam:        5.20 cm  2.04 cm/m   RA Area:     19.30 cm LA Vol (A2C):   125.0 ml 48.99 ml/m  RA Volume:   51.10 ml  20.03 ml/m LA Vol (A4C):   106.0 ml 41.54 ml/m LA Biplane Vol: 123.0 ml 48.20 ml/m  AORTIC VALVE LVOT Vmax:   96.10 cm/s LVOT Vmean:  64.300 cm/s LVOT VTI:    0.219 m  AORTA Ao Root diam: 3.40 cm Ao Asc diam:  3.60 cm MITRAL VALVE                TRICUSPID VALVE MV Area (PHT): 3.72 cm     TR Peak grad:   35.3 mmHg MV Decel Time: 204 msec     TR Vmax:        297.00 cm/s MV E velocity: 133.00 cm/s MV A velocity: 50.10 cm/s   SHUNTS MV E/A ratio:  2.65         Systemic VTI:  0.22 m                             Systemic Diam: 2.40 cm Dalton McleanMD Electronically signed by Wilfred Lacy Signature Date/Time: 04/16/2023/2:24:56 PM    Final    US RENAL Result Date: 04/16/2023 CLINICAL DATA:  Acute kidney injury EXAM: RENAL / URINARY TRACT ULTRASOUND COMPLETE COMPARISON:  None Available. FINDINGS: Right Kidney: Renal measurements: 12.9 x 6.9 x 6.3 cm = volume: 293 mL. Echogenicity within normal limits. No mass or hydronephrosis visualized. Left Kidney: Renal measurements: 13.0 x 6.2 x 5.5 cm = volume: 231 mL. Echogenicity within normal limits. No mass or hydronephrosis visualized. Bladder: Appears normal for degree of bladder distention. Other: None. IMPRESSION: Negative renal ultrasound. Electronically Signed   By: Charline Bills M.D.   On: 04/16/2023 02:42    Labs: BMET Recent Labs  Lab 04/11/23 1324 04/12/23 0415 04/13/23 0815 04/14/23 0423 04/15/23 0357 04/16/23 0752  NA 134* 138 137 138 137 140  K 3.4* 3.9 3.8 3.7 3.7 3.5  CL 101 101 106 104 108 107  CO2 24 24 24 23  21* 24  GLUCOSE 182* 168* 170* 154* 192* 143*  BUN 32* 33* 35* 36* 36* 36*  CREATININE 2.62* 2.62* 2.85* 2.98* 3.04* 3.20*  CALCIUM 9.0 9.5 8.7* 8.8* 8.4* 8.9   CBC Recent Labs  Lab 04/15/23 0357 04/15/23 1452 04/16/23 0752 04/17/23 1003  WBC 6.3 8.0 5.3 8.7  NEUTROABS  --  6.0  --   --    HGB 9.1* 9.2* 8.0* 8.0*  HCT 26.4* 27.0* 24.0* 24.1*  MCV 85.4 86.3 88.6 88.3  PLT 146* 168 121* 109*    Medications:     allopurinol  100 mg Oral Daily   amiodarone  200 mg Oral BID   carvedilol  3.125 mg Oral BID WC   Chlorhexidine Gluconate Cloth  6 each Topical Q0600   clopidogrel  75 mg Oral Q breakfast   docusate sodium  100 mg Oral BID   DULoxetine  60 mg Oral Daily   ezetimibe  10 mg Oral Daily   gabapentin  400 mg Oral TID   guaiFENesin  600 mg Oral BID   insulin aspart  0-9 Units Subcutaneous Q6H   insulin glargine  40 Units Subcutaneous Daily   levothyroxine  88 mcg Oral QAC breakfast   rosuvastatin  20 mg Oral QPM   sodium chloride flush  3 mL Intravenous Q12H   sodium chloride flush  3-10 mL Intravenous Q12H      Anthony Sar, MD [facility] 04/17/2023, 11:48 AM

## 2023-04-17 NOTE — Progress Notes (Signed)
 [facility]  Date of Admission:  03/27/2023      Total days of antibiotics 21   Daptomycin 2/06 >   Cefepime 2/06 >           ASSESSMENT: Harold [name] is a 68 y.o. male admitted with:  Persistent AKI -  Requested re-consult to help with further antibiotic decisions in the setting of unexplained AKI now with progressive oliguria. Creatinine has been ranging 2.3 - 3.21; trending up more recently. More oliguric. Nephrology now on board for assistance.  Suspect more chronic vs AIN concern but biopsy is on the table to help investigate further. For now will change to ertapenem - though all beta lactams could pose a risk for AIN. Avoid bactrim with kidney trouble. Avoid quinolone with QTc prolongation. Plates have likely already been discarded to consider running for MIC to see if omadacycline would be an option.    Respiratory Failure -  Now on oxygen with wheezing reported and hypoxia on room air 85%. Recovered on 2 LPM CXR with bilateral perihilar and basilar opacities concerning for edema vs pna. He does not have any infectious features and with oliguric renal   Osteomyelitis Rt Ankle, S/P HW Removal -  Enterobacter Cloacea, MRSE -  Continue treatment with daptomycin + ertapenem  Dr. Lajoyce Corners had orders to have him FU in 1 week on D/C navigator and has had this dressing in place 2 weeks now - I will message Dr. Lajoyce Corners to give him an update and see if he would like to come check in on his site non-urgently. No pus or anything painful for him.  Prolonged QTc -  QTc consistently > 500 ms throughout admission with most recent EKG 2/14 measuring 545 ms.  Would be difficult to get him through a course of cipro or levo for this reason    PLAN: Change cefepime to ertapenem Continue daptomycin  Follow kidney biopsy  Follow creatinine  I will message Dr. 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, right (HCC)   Abscess of skin of right ankle   PAD (peripheral artery disease) (HCC)   Cellulitis and abscess of foot    allopurinol  100 mg Oral Daily   amiodarone  200 mg Oral BID   carvedilol  3.125 mg Oral BID WC   Chlorhexidine Gluconate Cloth  6 each Topical Q0600   clopidogrel  75 mg Oral Q breakfast   docusate sodium  100 mg Oral BID   DULoxetine  60 mg Oral Daily   ezetimibe  10 mg Oral Daily   gabapentin  400 mg Oral TID   guaiFENesin  600 mg Oral BID   insulin aspart  0-9 Units Subcutaneous Q6H   insulin glargine  40 Units Subcutaneous Daily   levothyroxine  88 mcg Oral QAC breakfast   rosuvastatin  20 mg Oral QPM   sodium chloride flush  3 mL Intravenous Q12H   sodium chloride flush  3-10 mL Intravenous Q12H    SUBJECTIVE: Doing well. No complaints today. Pain controlled. Oxygen requirement is new  Review of Systems: Review of Systems  Constitutional:  Negative for chills and fever.  Respiratory:  Negative for cough.   Cardiovascular:  Negative for chest pain.  Skin:  Negative for rash.     Allergies  Allergen Reactions   Doxycycline Hives and Rash   Septra [Bactrim] Itching   Penicillins Rash    Allergy to All cillin drugs  Ancef given 9/24 with no obvious reaction   Metformin And Related Diarrhea and Nausea Only   Other Nausea And Vomiting    General Anesthesia - sometimes causes nausea and vomiting * OK to use scopolamine patch     Sulfamethoxazole-Trimethoprim Other (See Comments)   Wellbutrin [Bupropion]     Mood Changes    OBJECTIVE: Vitals:   04/17/23 0804 04/17/23 0945 04/17/23 0950 04/17/23 1054  BP: 125/60   (!) 159/71  Pulse:   65 67  Resp: 18 (!) 22  20  Temp:       TempSrc:      SpO2:  (!) 85% 91% 96%  Weight:      Height:       Body mass index is 39.38 kg/m.  Physical Exam HENT:     Mouth/Throat:     Mouth: Mucous membranes are moist.     Pharynx: Oropharynx is clear.  Eyes:     General: No scleral icterus.    Conjunctiva/sclera: Conjunctivae normal.     Pupils: Pupils are equal, round, and reactive to light.  Cardiovascular:     Rate and Rhythm: Normal rate and regular rhythm.  Pulmonary:     Effort: Pulmonary effort is normal.     Breath sounds: Normal breath sounds.  Musculoskeletal:        General: Normal range of motion.     Comments: Right foot in vac clean and dry.   Skin:    General: Skin is warm.     Capillary Refill: Capillary refill takes less than 2 seconds.  Neurological:     Mental Status: He is alert and oriented to person, place, and time.     Lab Results Lab Results  Component Value Date   WBC 8.7 04/17/2023   HGB 8.0 (L) 04/17/2023   HCT 24.1 (L) 04/17/2023   MCV 88.3 04/17/2023   PLT 109 (L) 04/17/2023    Lab Results  Component Value Date   CREATININE 3.21 (H) 04/17/2023   BUN 33 (H) 04/17/2023   NA 142 04/17/2023   K 4.5 04/17/2023   CL 108 04/17/2023   CO2 23 04/17/2023    Lab Results  Component Value Date   ALT 42 04/16/2023   AST 50 (H) 04/16/2023   GGT 28 02/20/2018   ALKPHOS 73 04/16/2023   BILITOT 0.6 04/16/2023     Microbiology: No results found for this or any previous visit (from the past 240 hours).   Rexene Alberts, MSN, NP-C [facility]  [email or web address] Pager: [phone] Office: [phone] [facility] Main Line: [phone] *Secure Chat Communication Welcome

## 2023-04-18 ENCOUNTER — Other Ambulatory Visit (HOSPITAL_COMMUNITY): Payer: Self-pay

## 2023-04-18 DIAGNOSIS — N179 Acute kidney failure, unspecified: Secondary | ICD-10-CM | POA: Diagnosis not present

## 2023-04-18 DIAGNOSIS — L02415 Cutaneous abscess of right lower limb: Secondary | ICD-10-CM | POA: Diagnosis not present

## 2023-04-18 DIAGNOSIS — L97314 Non-pressure chronic ulcer of right ankle with necrosis of bone: Secondary | ICD-10-CM | POA: Diagnosis not present

## 2023-04-18 DIAGNOSIS — N184 Chronic kidney disease, stage 4 (severe): Secondary | ICD-10-CM | POA: Diagnosis not present

## 2023-04-18 DIAGNOSIS — J101 Influenza due to other identified influenza virus with other respiratory manifestations: Secondary | ICD-10-CM | POA: Diagnosis not present

## 2023-04-18 DIAGNOSIS — M86171 Other acute osteomyelitis, right ankle and foot: Secondary | ICD-10-CM | POA: Diagnosis not present

## 2023-04-18 LAB — GLUCOSE, CAPILLARY
Glucose-Capillary: 140 mg/dL — ABNORMAL HIGH (ref 70–99)
Glucose-Capillary: 143 mg/dL — ABNORMAL HIGH (ref 70–99)
Glucose-Capillary: 144 mg/dL — ABNORMAL HIGH (ref 70–99)
Glucose-Capillary: 165 mg/dL — ABNORMAL HIGH (ref 70–99)

## 2023-04-18 LAB — RENAL FUNCTION PANEL
Albumin: 3.4 g/dL — ABNORMAL LOW (ref 3.5–5.0)
Anion gap: 15 (ref 5–15)
BUN: 36 mg/dL — ABNORMAL HIGH (ref 8–23)
CO2: 19 mmol/L — ABNORMAL LOW (ref 22–32)
Calcium: 9.1 mg/dL (ref 8.9–10.3)
Chloride: 103 mmol/L (ref 98–111)
Creatinine, Ser: 3.38 mg/dL — ABNORMAL HIGH (ref 0.61–1.24)
GFR, Estimated: 19 mL/min — ABNORMAL LOW (ref >60)
Glucose, Bld: 147 mg/dL — ABNORMAL HIGH (ref 70–99)
Phosphorus: 3.3 mg/dL (ref 2.5–4.6)
Potassium: 3.4 mmol/L — ABNORMAL LOW (ref 3.5–5.1)
Sodium: 137 mmol/L (ref 135–145)

## 2023-04-18 LAB — CBC
HCT: 24.3 % — ABNORMAL LOW (ref 39.0–52.0)
Hemoglobin: 8.3 g/dL — ABNORMAL LOW (ref 13.0–17.0)
MCH: 30.2 pg (ref 26.0–34.0)
MCHC: 34.2 g/dL (ref 30.0–36.0)
MCV: 88.4 fL (ref 80.0–100.0)
Platelets: 117 10*3/uL — ABNORMAL LOW (ref 150–400)
RBC: 2.75 MIL/uL — ABNORMAL LOW (ref 4.22–5.81)
RDW: 16.4 % — ABNORMAL HIGH (ref 11.5–15.5)
WBC: 10.2 10*3/uL (ref 4.0–10.5)
nRBC: 0 % (ref 0.0–0.2)

## 2023-04-18 LAB — CK: Total CK: 348 U/L (ref 49–397)

## 2023-04-18 LAB — APTT: aPTT: 82 s — ABNORMAL HIGH (ref 24–36)

## 2023-04-18 MED ORDER — BISACODYL 5 MG PO TBEC
5.0000 mg | DELAYED_RELEASE_TABLET | Freq: Once | ORAL | Status: AC
  Administered 2023-04-18: 5 mg via ORAL
  Filled 2023-04-18: qty 1

## 2023-04-18 MED ORDER — TAMSULOSIN HCL 0.4 MG PO CAPS
0.4000 mg | ORAL_CAPSULE | Freq: Every day | ORAL | Status: DC
  Administered 2023-04-18 – 2023-05-10 (×23): 0.4 mg via ORAL
  Filled 2023-04-18 (×23): qty 1

## 2023-04-18 MED ORDER — SODIUM CHLORIDE 0.9 % IV SOLN: 500.0000 mg | Freq: Every day | INTRAVENOUS | Status: DC

## 2023-04-18 MED ORDER — POLYETHYLENE GLYCOL 3350 17 G PO PACK
17.0000 g | PACK | Freq: Every day | ORAL | Status: DC
Start: 2023-04-18 — End: 2023-05-10
  Administered 2023-04-18 – 2023-05-02 (×14): 17 g via ORAL
  Filled 2023-04-18 (×21): qty 1

## 2023-04-18 MED ORDER — LACTULOSE 10 GM/15ML PO SOLN
20.0000 g | Freq: Once | ORAL | Status: AC
  Administered 2023-04-18: 20 g via ORAL
  Filled 2023-04-18: qty 30

## 2023-04-18 MED ORDER — FLEET ENEMA RE ENEM
1.0000 | ENEMA | Freq: Once | RECTAL | Status: AC
  Administered 2023-04-18: 1 via RECTAL
  Filled 2023-04-18: qty 1

## 2023-04-18 MED ORDER — HEPARIN (PORCINE) 25000 UT/250ML-% IV SOLN
1700.0000 [IU]/h | INTRAVENOUS | Status: DC
  Administered 2023-04-18 – 2023-04-19 (×2): 1650 [IU]/h via INTRAVENOUS
  Administered 2023-04-19: 1700 [IU]/h via INTRAVENOUS
  Filled 2023-04-18 (×3): qty 250

## 2023-04-18 NOTE — Progress Notes (Signed)
 PHARMACY NOTE:  ANTIMICROBIAL RENAL DOSAGE ADJUSTMENT  Current antimicrobial regimen includes a mismatch between antimicrobial dosage and estimated renal function.  As per policy approved by the Pharmacy & Therapeutics and Medical Executive Committees, the antimicrobial dosage will be adjusted accordingly.  Current antimicrobial dosage:  Ertapenem 1g IV every 24 hours  Indication: R-ankle wound  Renal Function:  Estimated Creatinine Clearance: 29.3 mL/min (A) (by C-G formula based on SCr of 3.38 mg/dL (H)). []      On intermittent HD, scheduled: []      On CRRT    Antimicrobial dosage has been changed to:  Ertapenem 500 mg IV every 24 hours  Additional comments:   Thank you for allowing pharmacy to be a part of this patient's care.  Georgina Pillion, PharmD, BCPS, BCIDP Infectious Diseases Clinical Pharmacist 04/18/2023 9:31 AM   **Pharmacist phone directory can now be found on [email or web address] ([email or web address]).  Listed under [facility].

## 2023-04-18 NOTE — Progress Notes (Signed)
 PHARMACY - ANTICOAGULATION CONSULT NOTE  Pharmacy Consult for heparin Indication: atrial fibrillation  Allergies  Allergen Reactions   Doxycycline Hives and Rash   Septra [Bactrim] Itching   Penicillins Rash    Allergy to All cillin drugs  Ancef given 9/24 with no obvious reaction   Metformin And Related Diarrhea and Nausea Only   Other Nausea And Vomiting    General Anesthesia - sometimes causes nausea and vomiting * OK to use scopolamine patch     Sulfamethoxazole-Trimethoprim Other (See Comments)   Wellbutrin [Bupropion]     Mood Changes    Patient Measurements: Height: 6\' 1"  (185.4 cm) Weight: 131.2 kg (289 lb 3.9 oz) IBW/kg (Calculated) : 79.9 Heparin Dosing Weight: 112.8 kg  Vital Signs: Temp: 98.6 F (37 C) (02/28 1523) Temp Source: Oral (02/28 1523) BP: 157/63 (02/28 1523) Pulse Rate: 71 (02/28 1523)  Labs: Recent Labs    04/16/23 0752 04/17/23 1003 04/17/23 1005 04/18/23 0542 04/18/23 1835  HGB 8.0* 8.0*  --  8.3*  --   HCT 24.0* 24.1*  --  24.3*  --   PLT 121* 109*  --  117*  --   APTT 71*  --   --   --  82*  HEPARINUNFRC 0.88*  --   --   --   --   CREATININE 3.20*  --  3.21* 3.38*  --   CKTOTAL  --   --   --  348  --     Estimated Creatinine Clearance: 29.3 mL/min (A) (by C-G formula based on SCr of 3.38 mg/dL (H)).   Medical History: Past Medical History:  Diagnosis Date   Anemia    Anginal pain (HCC) 04/2013   Cardiac cath showed patent stents with distal LAD, circumflex-OM and RCA disease in small vessels.   Anxiety    Atrial fibrillation (HCC) 12/2021   CAD S/P percutaneous coronary angioplasty 2003, 04/2012   status post PCI to LAD, circumflex-OM 2, RCA   Carotid artery occlusion    Chronic renal insufficiency, stage II (mild)    Chronic venous insufficiency    varicosities, no reflux; dopplers 04/14/12- valvular insufficiency in the R and L GSV   Complication of anesthesia    COPD, mild (HCC)    Depression    situaltional     Diabetes mellitus    Diabetic neuropathy (HCC) 08/24/2019   Dyslipidemia associated with type 2 diabetes mellitus (HCC)    GERD (gastroesophageal reflux disease)    History of cardioversion 12/2021   at [facility]   HTN (hypertension)    Hypothyroidism    Neuromuscular disorder (HCC)    neuropathy in feet   Neuropathy    notably improved following PCI with improved cardiac function   Obesity    Obesity, Class II, BMI 35.0-39.9, with comorbidity (see actual BMI)    BMI 39; wgt loss efforts in place; seeing Dietician   Orthostatic hypotension    PONV (postoperative nausea and vomiting)    "[employer]"   Pulmonary hypertension (HCC) 04/2012   PA pressure   Sleep apnea    uses nightly   Vitamin D deficiency    per facility notes    Medications:  Scheduled:   allopurinol  100 mg Oral Daily   amiodarone  200 mg Oral BID   carvedilol  3.125 mg Oral BID WC   Chlorhexidine Gluconate Cloth  6 each Topical Q0600   clopidogrel  75 mg Oral Q breakfast   docusate sodium  100 mg Oral BID   DULoxetine  60 mg Oral Daily   ezetimibe  10 mg Oral Daily   gabapentin  400 mg Oral TID   guaiFENesin  600 mg Oral BID   insulin aspart  0-9 Units Subcutaneous Q6H   insulin glargine  40 Units Subcutaneous Daily   levothyroxine  88 mcg Oral QAC breakfast   polyethylene glycol  17 g Oral Daily   sodium chloride flush  3 mL Intravenous Q12H   sodium chloride flush  3-10 mL Intravenous Q12H   tamsulosin  0.4 mg Oral Daily   Infusions:   heparin 1,650 Units/hr (04/18/23 1048)    Assessment: 68 y/o male with history of afib on apixaban. Now having significant bleeding from CVL site. Pharmacy consulted for heparin.   Eliquis has been held d/t bleeding from CVL site. Bleeding still occurring from site per RN. Per discussion with provider, would like to keep pt on anticoagulation due to afib but would like heparin therapy for now to monitor bleeding. Last eliquis dose was 2/20 PM.   Heparin  held 2/27 with concern for thigh hematoma, CT negative. Will resume heparin.   aPTT 82 - therapeutic  Goal of Therapy:  Heparin level 0.3-0.7 units/ml aPTT 66-102 seconds Monitor platelets by anticoagulation protocol: Yes   Plan:  Continue heparin 1650 units/h -no bolus Daily aPTT, CBC  Calton Dach, PharmD, BCCCP Clinical Pharmacist 04/18/2023 7:44 PM

## 2023-04-18 NOTE — TOC Progression Note (Signed)
 Transition of Care [facility]) - Progression Note    Patient Details  Name: Harold [name] MRN: [id number] Date of Birth: [birth year]  [facility] CM/SW Contact  Graves-Bigelow, Lamar Laundry, RN Phone Number: 04/18/2023, 3:40 PM  Clinical Narrative: Patient was discussed in morning progression rounds. [facility] continues to follow the patient for home IV antibiotics. Case Manager continues to follow for transition of care needs as the patient progresses.      Expected Discharge Plan: Home w Home Health Services Barriers to Discharge: No Barriers Identified  Expected Discharge Plan and Services In-house Referral: Clinical Social Work Discharge Planning Services: CM Consult Post Acute Care Choice: Home Health Living arrangements for the past 2 months: Single Family Home Expected Discharge Date: 04/10/23               DME Arranged: IV pump/equipment DME Agency:  [facility])       HH Arranged: RN, Disease Management, PT, OT, Nurse's Aide HH Agency: [facility])  Social Determinants of Health (SDOH) Interventions SDOH Screenings   Food Insecurity: No Food Insecurity (03/27/2023)  Housing: Low Risk  (03/28/2023)  Transportation Needs: No Transportation Needs (03/27/2023)  Utilities: Not At Risk (03/28/2023)  Social Connections: Unknown (03/28/2023)  Tobacco Use: Medium Risk (04/02/2023)    Readmission Risk Interventions     No data to display

## 2023-04-18 NOTE — Consult Note (Signed)
 WOC Nurse Consult Note: Reason for Consult: Requested to assess Right wound leg (ankle). Performed remotely after photos and notes. Wound type: Postsurgical wound infection. Pt has history of DM2, diabetic neuropathy, COPD, hypothyroidism that can interfered on healing process. Pressure Injury POA: NA Measurement: 1 - hematoma with blister. Aprox. 2.5cm  x 2 cm Wound bed: hematoma, partial thickness Periwound: intact, dry skin, fragile senior skin characteristics. Dressing procedure/placement/frequency: Apply foam dressing, change every 3 days.   Measurement: 2 - surgical site with sutures Aprox. 10 cm  x 3 cm x 0.1 cm Wound bed: 100% dark red. Periwound: intact, maceration on the bottom of the wound bed. Dressing procedure/placement/frequency: Apply Mepitel silicon non adherent ([id number]). Cover with ABD pad and wrap with Kerlix. Change daily.  Measurement: 3 - hypergranulation Aprox. 2cm  x 1.5 cm Wound bed: 100% dark red hypergranulation. Periwound: intact, dry skin, fragile senior skin characteristics. Dressing procedure/placement/frequency: Apply Xeroform and cover foam dressing, change daily.  Obs: Hydrated the skin leg with moisture cream daily.  WOC team will not plan to follow further.  Please reconsult if further assistance is needed. Thank-you,  Denyse Amass BSN, RN, [employer], WOC  (Pager: [phone])

## 2023-04-18 NOTE — Progress Notes (Signed)
 PHARMACY - ANTICOAGULATION CONSULT NOTE  Pharmacy Consult for heparin Indication: atrial fibrillation  Allergies  Allergen Reactions   Doxycycline Hives and Rash   Septra [Bactrim] Itching   Penicillins Rash    Allergy to All cillin drugs  Ancef given 9/24 with no obvious reaction   Metformin And Related Diarrhea and Nausea Only   Other Nausea And Vomiting    General Anesthesia - sometimes causes nausea and vomiting * OK to use scopolamine patch     Sulfamethoxazole-Trimethoprim Other (See Comments)   Wellbutrin [Bupropion]     Mood Changes    Patient Measurements: Height: 6\' 1"  (185.4 cm) Weight: 131.2 kg (289 lb 3.9 oz) IBW/kg (Calculated) : 79.9 Heparin Dosing Weight: 112.8 kg  Vital Signs: Temp: 98.7 F (37.1 C) (02/28 0810) Temp Source: Oral (02/28 0810) BP: 164/81 (02/28 0810) Pulse Rate: 78 (02/28 0810)  Labs: Recent Labs    04/15/23 1452 04/16/23 0752 04/17/23 1003 04/17/23 1005 04/18/23 0542  HGB 9.2* 8.0* 8.0*  --  8.3*  HCT 27.0* 24.0* 24.1*  --  24.3*  PLT 168 121* 109*  --  117*  APTT  --  71*  --   --   --   HEPARINUNFRC  --  0.88*  --   --   --   CREATININE  --  3.20*  --  3.21* 3.38*  CKTOTAL 82  --   --   --  348    Estimated Creatinine Clearance: 29.3 mL/min (A) (by C-G formula based on SCr of 3.38 mg/dL (H)).   Medical History: Past Medical History:  Diagnosis Date   Anemia    Anginal pain (HCC) 04/2013   Cardiac cath showed patent stents with distal LAD, circumflex-OM and RCA disease in small vessels.   Anxiety    Atrial fibrillation (HCC) 12/2021   CAD S/P percutaneous coronary angioplasty 2003, 04/2012   status post PCI to LAD, circumflex-OM 2, RCA   Carotid artery occlusion    Chronic renal insufficiency, stage II (mild)    Chronic venous insufficiency    varicosities, no reflux; dopplers 04/14/12- valvular insufficiency in the R and L GSV   Complication of anesthesia    COPD, mild (HCC)    Depression    situaltional     Diabetes mellitus    Diabetic neuropathy (HCC) 08/24/2019   Dyslipidemia associated with type 2 diabetes mellitus (HCC)    GERD (gastroesophageal reflux disease)    History of cardioversion 12/2021   at [facility]   HTN (hypertension)    Hypothyroidism    Neuromuscular disorder (HCC)    neuropathy in feet   Neuropathy    notably improved following PCI with improved cardiac function   Obesity    Obesity, Class II, BMI 35.0-39.9, with comorbidity (see actual BMI)    BMI 39; wgt loss efforts in place; seeing Dietician   Orthostatic hypotension    PONV (postoperative nausea and vomiting)    "[employer]"   Pulmonary hypertension (HCC) 04/2012   PA pressure   Sleep apnea    uses nightly   Vitamin D deficiency    per facility notes    Medications:  Scheduled:   allopurinol  100 mg Oral Daily   amiodarone  200 mg Oral BID   carvedilol  3.125 mg Oral BID WC   Chlorhexidine Gluconate Cloth  6 each Topical Q0600   clopidogrel  75 mg Oral Q breakfast   docusate sodium  100 mg Oral BID  DULoxetine  60 mg Oral Daily   ezetimibe  10 mg Oral Daily   gabapentin  400 mg Oral TID   guaiFENesin  600 mg Oral BID   insulin aspart  0-9 Units Subcutaneous Q6H   insulin glargine  40 Units Subcutaneous Daily   levothyroxine  88 mcg Oral QAC breakfast   polyethylene glycol  17 g Oral Daily   rosuvastatin  20 mg Oral QPM   sodium chloride flush  3 mL Intravenous Q12H   sodium chloride flush  3-10 mL Intravenous Q12H   Infusions:   DAPTOmycin 700 mg (04/17/23 1518)   [START ON 04/19/2023] ertapenem      Assessment: 68 y/o male with history of afib on apixaban. Now having significant bleeding from CVL site. Pharmacy consulted for heparin.   Eliquis has been held d/t bleeding from CVL site. Bleeding still occurring from site per RN. Per discussion with provider, would like to keep pt on anticoagulation due to afib but would like heparin therapy for now to monitor bleeding. Last eliquis  dose was 2/20 PM.   Heparin held 2/27 with concern for thigh hematoma, CT negative. Will resume heparin.   Goal of Therapy:  Heparin level 0.3-0.7 units/ml aPTT 66-102 seconds Monitor platelets by anticoagulation protocol: Yes   Plan:  Resume heparin 1650 units/h no bolus Check aPTT in 8h  Fredonia Highland, PharmD, [place], [facility] Clinical Pharmacist Please check AMION for all [facility] Pharmacy numbers 04/18/2023

## 2023-04-18 NOTE — Progress Notes (Signed)
 [facility]  Date of Admission:  03/27/2023      Abx: 2/27-c ertapenem daptomycin  Cefepime finished     ASSESSMENT: Harold [name] is a 68 y.o. male admitted with:  A/p Om s/p I&D and hardware removal Qtc prolongation Aki on ckd  Initial cx enterobacter cloacae and mrse   Kidney function continues to worsen No rash No blood in urine  Discussed with renal. Unclear if this is ain and unclear if it is even and that changing to ertapenem would help  Discussed with ortho and give good gross surgical margin reasonable to hold abx for 2-3 weeks.   The risk of progressive aki outweighs benefit of continue abx  04/18/23 he also starts to have sign of volume overload  Other options for cefepime would be cipro but qtc issue, or omadocycline but not previously tested    PLAN: Stop all abx Trend weekly esr/crp Will continue to see id in clinic when patient discharge -- appointment made for 3/12 but please let id know if patient still remain in house by that time so we can reschedule Discussed with primary team/renal/ortho   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, right (HCC)   Abscess of skin of right ankle   PAD (peripheral artery disease) (HCC)   Cellulitis and abscess of foot   Osteomyelitis (HCC)    allopurinol  100 mg Oral Daily   amiodarone  200 mg Oral BID   carvedilol  3.125 mg Oral BID WC   Chlorhexidine Gluconate Cloth  6 each Topical Q0600   clopidogrel  75 mg Oral Q breakfast   docusate sodium  100 mg Oral BID   DULoxetine  60 mg Oral Daily   ezetimibe  10 mg Oral Daily   gabapentin  400 mg Oral TID   guaiFENesin  600 mg  Oral BID   insulin aspart  0-9 Units Subcutaneous Q6H   insulin glargine  40 Units Subcutaneous Daily   levothyroxine  88 mcg Oral QAC breakfast   polyethylene glycol  17 g Oral Daily   sodium chloride flush  3 mL Intravenous Q12H   sodium chloride flush  3-10 mL Intravenous Q12H   tamsulosin  0.4 mg Oral Daily    SUBJECTIVE: Short of breath last 24 hours Renal biopsy cancelled due to instability Afebrile No rash  Cr rising   Review of Systems: Review of Systems  Constitutional:  Negative for chills and fever.  Respiratory:  Negative for cough.   Cardiovascular:  Negative for chest pain.  Skin:  Negative for rash.     Allergies  Allergen Reactions   Doxycycline Hives and Rash   Septra [Bactrim] Itching   Penicillins Rash    Allergy to All cillin drugs  Ancef given 9/24 with no obvious reaction   Metformin And Related Diarrhea and Nausea Only   Other Nausea And Vomiting    General Anesthesia - sometimes causes nausea and vomiting * OK to use scopolamine patch     Sulfamethoxazole-Trimethoprim Other (See Comments)   Wellbutrin [Bupropion]     Mood Changes    OBJECTIVE: Vitals:   04/18/23  0319 04/18/23 0810 04/18/23 1204 04/18/23 1523  BP: (!) 167/64 (!) 164/81  (!) 157/63  Pulse: 69 78  71  Resp: 18 20 20 20   Temp: 97.7 F (36.5 C) 98.7 F (37.1 C) 98.5 F (36.9 C) 98.6 F (37 C)  TempSrc: Oral Oral Oral Oral  SpO2: 91% 94%  99%  Weight: 131.2 kg     Height:       Body mass index is 38.16 kg/m.  Physical Exam HENT:     Mouth/Throat:     Mouth: Mucous membranes are moist.     Pharynx: Oropharynx is clear.  Eyes:     General: No scleral icterus.    Conjunctiva/sclera: Conjunctivae normal.     Pupils: Pupils are equal, round, and reactive to light.  Cardiovascular:     Rate and Rhythm: Normal rate and regular rhythm.  Pulmonary:     Effort: Pulmonary effort is normal.     Breath sounds: Normal breath sounds.  Musculoskeletal:        General:  Normal range of motion.     Comments: Right foot in vac clean and dry.   Skin:    General: Skin is warm.     Capillary Refill: Capillary refill takes less than 2 seconds.  Neurological:     Mental Status: He is alert and oriented to person, place, and time.     Lab Results Lab Results  Component Value Date   WBC 10.2 04/18/2023   HGB 8.3 (L) 04/18/2023   HCT 24.3 (L) 04/18/2023   MCV 88.4 04/18/2023   PLT 117 (L) 04/18/2023    Lab Results  Component Value Date   CREATININE 3.38 (H) 04/18/2023   BUN 36 (H) 04/18/2023   NA 137 04/18/2023   K 3.4 (L) 04/18/2023   CL 103 04/18/2023   CO2 19 (L) 04/18/2023    Lab Results  Component Value Date   ALT 42 04/16/2023   AST 50 (H) 04/16/2023   GGT 28 02/20/2018   ALKPHOS 73 04/16/2023   BILITOT 0.6 04/16/2023     Microbiology: No results found for this or any previous visit (from the past 240 hours).         Raymondo Band, MD [facility] [phone]  pager   [phone] cell 04/18/2023, 4:28 PM

## 2023-04-18 NOTE — Progress Notes (Signed)
 Physical Therapy Treatment Patient Details Name: Harold [name] MRN: [id number] DOB: 1956-01-14 Today's Date: 04/18/2023   History of Present Illness 68 y.o. male presents to [facility] 03/27/23 with productive cough and generalized weakness. Admitted with influenza A and cellulitis of R foot s/p hardware removal of the R ankle and application of wound vac on 2/10, wound vac removed 2/27. Left thigh pain, CT negative. Prior admit 11/24 for R ankle ORIF and in 12/24 for revision. S/p excisional debridement of right ankle on 2/12. Pt underwent central line placement on 04/07/2023 however he has been having issues with recurrent bleeding from the insertion site which has finally stopped. 04/17/23 pt with drop in sats requiring 2 L of O2 via [place], chest xray concerning for edema or possibly pneumonia.Significant PMH: HTN, HLD, CAD s/p PCI, COPD, DM2, CKD stage IIIb, PVD, gout, OSA, neuropathy, COPD, a-fib, CAD s/p percutaneous coronary angioplasty, angina, obesity    PT Comments  STAR PT/OT Session: Pt found slid down in bed with increased WoB. Pt reports he can't work with therapy because it is so hard to breathe. Reminded pt that it was easier to breathe when he was sitting more upright and sitting up in recliner would be good for that. Pt reluctantly agrees, but reports he still has increased pain in his L quadriceps. Pt modAx2 for coming to EoB, [facility] at [facility] pt belches x2 and reports decreased pressure in his abdomen and increased comfort. PT points out how the digestive system works better in upright as well. Pt requires modAx2 for STS from elevated EoB. With increased encouragement pt and close chair follow patient agreeable to short bout of ambulation. Pt able to walk 8 feet with min Ax2. Pt able to sit down in recliner. With elevation of LE pt R hip noted to be in external rotation, due to part to pt with difficulty keeping CAM boot in neutral position. Provided bracing with pillows but also instructed patient to  actively maintain neutral alignment of his RLE sitting up in recliner. Pt voices understanding. PT will continue to follow pt with ultimate goal of discharge home.    If plan is discharge home, recommend the following: A lot of help with walking and/or transfers;A lot of help with bathing/dressing/bathroom;Assistance with cooking/housework;Assist for transportation;Help with stairs or ramp for entrance   Can travel by private vehicle     Yes  Equipment Recommendations  BSC/3in1 (Bariatric; Pt has a tub bench, w/c, RW, and sliding board already)       Precautions / Restrictions Precautions Precautions: Fall Recall of Precautions/Restrictions: Impaired Precaution/Restrictions Comments: Watch BP Required Braces or Orthoses: Other Brace Other Brace: R CAM boot for ambulation, R splint in bed, abdominal binder (Prn) Restrictions Weight Bearing Restrictions Per Provider Order: Yes RLE Weight Bearing Per Provider Order: Weight bearing as tolerated Other Position/Activity Restrictions: per Dr. Audrie Lia note 04/03/2023 and orders 04/03/2023 pt WBAT     Mobility  Bed Mobility Overal bed mobility: Needs Assistance Bed Mobility: Supine to Sit     Supine to sit: HOB elevated, Used rails, Mod assist     General bed mobility comments: modA for trunk management and pad scoot for pt to come to seated EoB with pt use of bedrail    Transfers Overall transfer level: Needs assistance Equipment used: Rolling walker (2 wheels) Transfers: Sit to/from Stand Sit to Stand: Mod assist, +2 physical assistance, From elevated surface           General transfer comment: Pt attempted to  stand first time with less A but then had to give him Mod A +2 to stand from bed Transfer via Lift Equipment: Kandee Keen  Ambulation/Gait Ambulation/Gait assistance: +2 safety/equipment, Min assist (Chair Follow) Gait Distance (Feet): 8 Feet Assistive device: Rolling walker (2 wheels) Gait Pattern/deviations: Step-to  pattern, Decreased stride length, Trunk flexed, Decreased weight shift to right, Decreased stance time - right, Decreased step length - left, Antalgic Gait velocity: reduced Gait velocity interpretation: <1.31 ft/sec, indicative of household ambulator   General Gait Details: distance limited due pain in L quadriceps and increased WoB       Balance Overall balance assessment: Needs assistance Sitting-balance support: No upper extremity supported, Feet supported Sitting balance-Leahy Scale: Fair Sitting balance - Comments: statically Postural control: Posterior lean Standing balance support: Bilateral upper extremity supported, During functional activity, Reliant on assistive device for balance Standing balance-Leahy Scale: Poor Standing balance comment: Reliant on RW                            Communication Communication Communication: No apparent difficulties;Impaired  Cognition Arousal: Alert Behavior During Therapy: Flat affect   PT - Cognitive impairments: Memory, Safety/Judgement, Problem solving                       PT - Cognition Comments: deficits with memory, word finding, clarity of speech Following commands: Intact      Cueing Cueing Techniques: Verbal cues, Tactile cues  Exercises      General Comments General comments (skin integrity, edema, etc.): BP is sporadic:Supine 161/68 (93) Seated 135/65 Seated 137/89 (101) after sitting 3 min Seated 109/66 (79) After ambulation Seated 159/64 (91)after sitting 3 min      Pertinent Vitals/Pain Pain Assessment Pain Assessment: 0-10 Faces Pain Scale: Hurts even more Pain Location: L quadriceps with flexion Pain Descriptors / Indicators: Discomfort, Guarding Pain Intervention(s): Limited activity within patient's tolerance, Monitored during session, Repositioned     PT Goals (current goals can now be found in the care plan section) Acute Rehab PT Goals Patient Stated Goal: Return home  confidently PT Goal Formulation: With patient Time For Goal Achievement: 05/01/23 Potential to Achieve Goals: Good Progress towards PT goals: Progressing toward goals    Frequency    Min 1X/week           Co-evaluation PT/OT/SLP Co-Evaluation/Treatment: Yes Reason for Co-Treatment: To address functional/ADL transfers;For patient/therapist safety;Other (comment) (activity tolerance) PT goals addressed during session: Mobility/safety with mobility;Balance OT goals addressed during session: ADL's and self-care      AM-PAC PT "6 Clicks" Mobility   Outcome Measure  Help needed turning from your back to your side while in a flat bed without using bedrails?: A Little Help needed moving from lying on your back to sitting on the side of a flat bed without using bedrails?: A Lot Help needed moving to and from a bed to a chair (including a wheelchair)?: A Lot Help needed standing up from a chair using your arms (e.g., wheelchair or bedside chair)?: A Lot Help needed to walk in hospital room?: Total Help needed climbing 3-5 steps with a railing? : Total 6 Click Score: 11    End of Session Equipment Utilized During Treatment: Gait belt Activity Tolerance: Patient limited by pain Patient left: with call bell/phone within reach;in chair Nurse Communication: Mobility status;Other (comment) (BP) PT Visit Diagnosis: Other abnormalities of gait and mobility (R26.89);Unsteadiness on feet (R26.81);Muscle weakness (generalized) (M62.81);History  of falling (Z91.81);Difficulty in walking, not elsewhere classified (R26.2)     Time: 1610-9604 PT Time Calculation (min) (ACUTE ONLY): 34 min  Charges:    $Gait Training: 8-22 mins PT General Charges $$ ACUTE PT VISIT: 1 Visit                     Christain Niznik B. Beverely Risen PT, DPT Acute Rehabilitation Services Please use secure chat or  Call Office [phone]    Elon Alas Fleet 04/18/2023, 4:20 PM

## 2023-04-18 NOTE — Plan of Care (Signed)
  Problem: Education: Goal: Knowledge of General Education information will improve Description: Including pain rating scale, medication(s)/side effects and non-pharmacologic comfort measures Outcome: Progressing   Problem: Health Behavior/Discharge Planning: Goal: Ability to manage health-related needs will improve Outcome: Progressing   Problem: Clinical Measurements: Goal: Ability to maintain clinical measurements within normal limits will improve Outcome: Progressing Goal: Will remain free from infection Outcome: Progressing Goal: Diagnostic test results will improve Outcome: Progressing Goal: Cardiovascular complication will be avoided Outcome: Progressing   Problem: Activity: Goal: Risk for activity intolerance will decrease Outcome: Progressing   Problem: Nutrition: Goal: Adequate nutrition will be maintained Outcome: Progressing   Problem: Coping: Goal: Level of anxiety will decrease Outcome: Progressing   Problem: Elimination: Goal: Will not experience complications related to bowel motility Outcome: Progressing Goal: Will not experience complications related to urinary retention Outcome: Progressing   Problem: Pain Managment: Goal: General experience of comfort will improve and/or be controlled Outcome: Progressing   Problem: Safety: Goal: Ability to remain free from injury will improve Outcome: Progressing   Problem: Skin Integrity: Goal: Risk for impaired skin integrity will decrease Outcome: Progressing   Problem: Education: Goal: Ability to describe self-care measures that may prevent or decrease complications (Diabetes Survival Skills Education) will improve Outcome: Progressing Goal: Individualized Educational Video(s) Outcome: Progressing   Problem: Coping: Goal: Ability to adjust to condition or change in health will improve Outcome: Progressing   Problem: Fluid Volume: Goal: Ability to maintain a balanced intake and output will  improve Outcome: Progressing   Problem: Health Behavior/Discharge Planning: Goal: Ability to identify and utilize available resources and services will improve Outcome: Progressing Goal: Ability to manage health-related needs will improve Outcome: Progressing   Problem: Metabolic: Goal: Ability to maintain appropriate glucose levels will improve Outcome: Progressing   Problem: Nutritional: Goal: Maintenance of adequate nutrition will improve Outcome: Progressing Goal: Progress toward achieving an optimal weight will improve Outcome: Progressing   Problem: Skin Integrity: Goal: Risk for impaired skin integrity will decrease Outcome: Progressing   Problem: Tissue Perfusion: Goal: Adequacy of tissue perfusion will improve Outcome: Progressing   Problem: Education: Goal: Understanding of CV disease, CV risk reduction, and recovery process will improve Outcome: Progressing Goal: Individualized Educational Video(s) Outcome: Progressing   Problem: Activity: Goal: Ability to return to baseline activity level will improve Outcome: Progressing   Problem: Cardiovascular: Goal: Ability to achieve and maintain adequate cardiovascular perfusion will improve Outcome: Progressing Goal: Vascular access site(s) Level 0-1 will be maintained Outcome: Progressing   Problem: Health Behavior/Discharge Planning: Goal: Ability to safely manage health-related needs after discharge will improve Outcome: Progressing

## 2023-04-18 NOTE — Progress Notes (Signed)
 Occupational Therapy Treatment Patient Details Name: CHRISTOPHE RISING MRN: [id number] DOB: 17-Jun-1955 Today's Date: 04/18/2023   History of present illness 68 y.o. male presents to [facility] 03/27/23 with productive cough and generalized weakness. Admitted with influenza A and cellulitis of R foot s/p hardware removal of the R ankle and application of wound vac on 2/10, wound vac removed 2/27. Left thigh pain, CT negative. Prior admit 11/24 for R ankle ORIF and in 12/24 for revision. S/p excisional debridement of right ankle on 2/12. Pt underwent central line placement on 04/07/2023 however he has been having issues with recurrent bleeding from the insertion site which has finally stopped. 04/17/23 pt with drop in sats requiring 2 L of O2 via [place], chest xray concerning for edema or possibly pneumonia.Significant PMH: HTN, HLD, CAD s/p PCI, COPD, DM2, CKD stage IIIb, PVD, gout, OSA, neuropathy, COPD, a-fib, CAD s/p percutaneous coronary angioplasty, angina, obesity   OT comments  This 68 yo male making progress today over yesterday. Still Mod A for up to EOB, but today able to stand  (Mod A +2)and ambulate 10 feet with RW (min A +2) and chair follow. We encouraged him to ask RN staff to get him up to recliner over the weekend so he could continue to make progress. He will continue to benefit from acute OT with follow up HHOT still recommended as well as 24 hour S/prn A.      If plan is discharge home, recommend the following:  A lot of help with walking and/or transfers;A lot of help with bathing/dressing/bathroom;Assistance with cooking/housework;Help with stairs or ramp for entrance;Assist for transportation   Equipment Recommendations  Other (comment) (bariatric BSC)       Precautions / Restrictions Precautions Precautions: Fall Recall of Precautions/Restrictions: Impaired Precaution/Restrictions Comments: Watch BP Required Braces or Orthoses: Other Brace Other Brace: R CAM boot for ambulation, R  splint in bed, abdominal binder (Prn) Restrictions Weight Bearing Restrictions Per Provider Order: Yes RLE Weight Bearing Per Provider Order: Weight bearing as tolerated Other Position/Activity Restrictions: per Dr. Audrie Lia note 04/03/2023 and orders 04/03/2023 pt WBAT       Mobility Bed Mobility Overal bed mobility: Needs Assistance Bed Mobility: Supine to Sit     Supine to sit: HOB elevated, Used rails, Mod assist     General bed mobility comments: modA for trunk management and pad scoot for pt to come to seated EoB with pt use of bedrail    Transfers Overall transfer level: Needs assistance Equipment used: Rolling walker (2 wheels) Transfers: Sit to/from Stand Sit to Stand: Mod assist, +2 physical assistance, From elevated surface           General transfer comment: Pt attempted to stand first time with less A but then had to give him Mod A +2 to stand from bed     Balance Overall balance assessment: Needs assistance Sitting-balance support: No upper extremity supported, Feet supported Sitting balance-Leahy Scale: Fair     Standing balance support: Bilateral upper extremity supported, During functional activity, Reliant on assistive device for balance Standing balance-Leahy Scale: Poor Standing balance comment: Reliant on RW                           ADL either performed or assessed with clinical judgement   ADL Overall ADL's : Needs assistance/impaired  Toilet Transfer: Minimal assistance;+2 for physical assistance;Ambulation;Rolling walker (2 wheels) Toilet Transfer Details (indicate cue type and reason): simulated bed>ambulate 10 feet>sit in recliner behind him                Extremity/Trunk Assessment Upper Extremity Assessment Upper Extremity Assessment: Overall WFL for tasks assessed            Vision Baseline Vision/History: 1 Wears glasses Ability to See in Adequate Light: 0 Adequate Patient Visual  Report: No change from baseline           Communication Communication Communication: No apparent difficulties;Impaired   Cognition Arousal: Alert Behavior During Therapy: Flat affect Cognition: No apparent impairments                               Following commands: Intact        Cueing   Cueing Techniques: Verbal cues, Tactile cues        General Comments BP is sporadic:Supine 161/68 (93)  Seated 135/65   Seated 137/89 (101)  after sitting 3 min  Seated 109/66 (79) After ambulation   Seated 159/64 (91)after sitting 3 min    Pertinent Vitals/ Pain       Pain Assessment Pain Assessment: 0-10 Pain Score: 9  Faces Pain Scale: Hurts even more Pain Location: L quadriceps with flexion Pain Descriptors / Indicators: Discomfort, Guarding Pain Intervention(s): Limited activity within patient's tolerance, Monitored during session, Repositioned         Frequency  Min 1X/week        Progress Toward Goals  OT Goals(current goals can now be found in the care plan section)  Progress towards OT goals: Progressing toward goals (but not doing as well as when I initially saw him (needs increased A for everything))  Acute Rehab OT Goals Patient Stated Goal: Pain in LLE to be better OT Goal Formulation: With patient Time For Goal Achievement: 05/01/23 Potential to Achieve Goals: Good  Plan      Co-evaluation    PT/OT/SLP Co-Evaluation/Treatment: Yes Reason for Co-Treatment: To address functional/ADL transfers;For patient/therapist safety;Other (comment) (activity tolerance) PT goals addressed during session: Mobility/safety with mobility;Balance OT goals addressed during session: ADL's and self-care      AM-PAC OT "6 Clicks" Daily Activity     Outcome Measure   Help from another person eating meals?: None Help from another person taking care of personal grooming?: A Little (setup) Help from another person toileting, which includes using toliet, bedpan, or  urinal?: A Lot Help from another person bathing (including washing, rinsing, drying)?: A Lot Help from another person to put on and taking off regular upper body clothing?: A Little (setup) Help from another person to put on and taking off regular lower body clothing?: Total 6 Click Score: 15    End of Session Equipment Utilized During Treatment: Gait belt;Rolling walker (2 wheels)  OT Visit Diagnosis: Unsteadiness on feet (R26.81);Other abnormalities of gait and mobility (R26.89);Muscle weakness (generalized) (M62.81);Pain Pain - Right/Left: Left Pain - part of body:  (quadraceps)   Activity Tolerance Patient limited by fatigue   Patient Left in chair;with call bell/phone within reach           Time: 1313-1343 OT Time Calculation (min): 30 min  Charges: OT General Charges $OT Visit: 1 Visit OT Treatments $Self Care/Home Management : 8-22 mins  Lindon Romp OT Acute Rehabilitation Services Office [phone]    Evette Georges 04/18/2023, 3:47  PM

## 2023-04-18 NOTE — Progress Notes (Signed)
 [facility]                                                                              Harold [name], is a 68 y.o. male, DOB - March 17, 1955, MWN:[id number] Admit date - 03/27/2023    Outpatient Primary MD for the patient is [name], MD  LOS - 22  days  Chief Complaint  Patient presents with   Cough       Brief summary   Patient is a 68 year old male with morbid obesity, OSA, DM2, HTN, HLD, CAD/stent, A-fib on Eliquis, carotid artery stenosis, CKD, chronic anemia, anxiety/depression, diabetic neuropathy, GERD, hypothyroidism, COPD, pulmonary hypertension. 2/6, patient presented to the ED with complaint of cough, subjective fever, myalgia, generalized weakness for 5 days. Respiratory virus panel positive for influenza A. Chest x-ray negative for infiltrates. Also noted to have right leg cellulitis.  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 -S/p right ankle hardware removal, I&D and wound VAC placement on 2/10 by Dr. Jena Gauss -S/p repeat debridement of the right ankle by Dr. Lajoyce Corners 04/02/2023.   -ID following, changed to cefepime to ertapenem, continue daptomycin however given the concern for possible AIN from antibiotics, causing AKI, ID considering holding the antibiotics -Dr. Lajoyce Corners to evaluate the wound -PT recommended SNF however patient will have to pay out-of-pocket and unable to afford. -Antibiotics per ID    AKI on CKD stage IIIb:  Nephrotic range proteinuria   -Baseline creatinine appears to be 1.9-2.2.   -Creatinine peaked to 3.1 on 2/19 that started to trend up after increasing lasix dose to 80mg  -Lasix placed on hold however creatinine continued to trend up, nephrology was consulted -Has received IV albumin, then noted to be congested, elevated BNP 765.1.  Chest x-ray showed bilateral  perihilar and basilar opacities, edema versus pneumonia. -Creatinine 3.38 today, initial plan for renal biopsy today however patient has been on Plavix which will need to be held if for 5 days.  -Per renal, given concern for AIN and with nephrotic proteinuria, initially planned for renal biopsy today however now on hold given high risk of bleeding and patient has been on Plavix.  At this point risks outweigh benefits and antibiotics to be held by ID (?).   -received IV lasix 2/27   PAD -Seen by vascular surgery underwent aortogram, right leg angiogram, successful stenting of right superficial femoral artery on 04/01/2023.   -Patient on Plavix and heparin gtt  -On 12/27, patient complained of left thigh pain with area of tenderness noted above knee.  -CT of the left femur negative for hematoma.   Depression ?Acute metabolic encephalopathy  - Continue Cymbalta - partner feels cognitive dysfunction, hand tremors in the last 3 days, ?cefepime induced encephalopathy. Cognitive dysfunction ongoing for months, worsening per patient's partner  - Cefepime now discontinued.  If no significant improvement in the 24 to 48 hours, will discuss with neurology   Acute diarrhea Resolved   Influenza A:  - Presented with 5 days of cough, fever, myalgia.  - Respiratory  virus panel positive for influenza A. Chest x-ray without acute infiltrate -Improved with symptomatic treatment, was outside 48-hour window for Tamiflu hence was not initiated.    Type 2 diabetes mellitus / Diabetic neuropathy, POA: A1c 7 on 01/02/2023. PTA meds-Tresiba 60 units daily, Mounjaro  CBG (last 3)  Recent Labs    04/18/23 0015 04/18/23 0557 04/18/23 1203  GLUCAP 144* 140* 143*   -Currently on Semglee to 40 units daily, sliding scale insulin sensitive  Diabetic neuropathy -PTA reportedly on gabapentin 1600 mg 3 times daily. Since dose was unable to be confirmed, pt was continued on 400 mg 3 times daily instead    Hypokalemia/hypomagnesemia: Improved   Paroxysmal A-fib Rates controlled on PTA meds Coreg, amiodarone  -CHADS-VASc of 4 -Eliquis held secondary to bleeding from CVL recently -Heparin drip resumed today   Essential hypertension -PTA meds- Coreg 3.125 mg twice daily, Lasix 160 mg daily -Currently on Coreg, amlodipine. -  Lasix held due to AKI   Anemia of chronic disease: Baseline hemoglobin between 10.5 11.5.   -Hgb remains stable. Bleeding recently noted at CVL site, eliquis held for now -H&H stable, heparin drip resumed   CAD/stent Carotid artery stenosis HLD Continue PTA meds -Plavix, Crestor -Eliquis held for now, continue heparin gtt and monitor for any bleeding issues  Hypothyroidism Continue Synthroid   Obstructive sleep apnea Patient reported good compliance on home nocturnal CPAP   Gout Continue allopurinol, colchicine    Orthostatic hypotension -Received brief IV fluids, continue abdominal binder -2D echo showed EF 55 to 60%, basal inferior hypokinesis, G2 DD    Moderate obesity, class II Estimated body mass index is 38.16 kg/m as calculated from the following:   Height as of this encounter: 6\' 1"  (1.854 m).   Weight as of this encounter: 131.2 kg.  Code Status: Full code DVT Prophylaxis:  Place and maintain sequential compression device Start: 04/15/23 1549 SCDs Start: 04/02/23 1617 SCDs Start: 03/31/23 1620 SCDs Start: 03/27/23 2243   Level of Care: Level of care: Progressive Cardiac Family Communication: Updated patient's partner, Mr Aldona Lento on phone today Disposition Plan:      Remains inpatient appropriate:      Procedures:  2D echo  Consultants:   Renal Ortho ID   Antimicrobials:   Anti-infectives (From admission, onward)    Start     Dose/Rate Route Frequency Ordered Stop   04/19/23 1000  ertapenem (INVANZ) 500 mg in sodium chloride 0.9 % 50 mL IVPB  Status:  Discontinued        500 mg 100 mL/hr over 30 Minutes  Intravenous Daily 04/18/23 0931 04/18/23 1106   04/17/23 1800  ertapenem (INVANZ) 1 g in sodium chloride 0.9 % 100 mL IVPB  Status:  Discontinued        1 g 200 mL/hr over 30 Minutes Intravenous Daily 04/17/23 1354 04/18/23 0931   04/08/23 0000  daptomycin (CUBICIN) IVPB  Status:  Discontinued        700 mg Intravenous Every 24 hours 04/08/23 1147 04/08/23    04/08/23 0000  ceFEPime (MAXIPIME) IVPB  Status:  Discontinued        2 g Intravenous Every 12 hours 04/08/23 1147 04/18/23    04/08/23 0000  daptomycin (CUBICIN) IVPB  Status:  Discontinued        700 mg Intravenous Every 24 hours 04/08/23 1150 04/18/23    04/04/23 1400  DAPTOmycin (CUBICIN) IVPB 700 mg/168mL premix  Status:  Discontinued        700  mg 200 mL/hr over 30 Minutes Intravenous Daily 04/03/23 1435 04/18/23 1106   04/02/23 1715  ceFAZolin (ANCEF) IVPB 2g/100 mL premix  Status:  Discontinued        2 g 200 mL/hr over 30 Minutes Intravenous Every 8 hours 04/02/23 1616 04/02/23 1623   04/02/23 0600  ceFAZolin (ANCEF) IVPB 3g/150 mL premix  Status:  Discontinued        3 g 300 mL/hr over 30 Minutes Intravenous On call to O.R. 04/01/23 1700 04/02/23 1549   03/31/23 1015  ceFAZolin (ANCEF) IVPB 2g/100 mL premix        2 g 200 mL/hr over 30 Minutes Intravenous On call to O.R. 03/31/23 0921 03/31/23 1203   03/29/23 2200  vancomycin (VANCOREADY) IVPB 2000 mg/400 mL  Status:  Discontinued       Placed in "Followed by" Linked Group   2,000 mg 200 mL/hr over 120 Minutes Intravenous Every 48 hours 03/27/23 2257 04/03/23 1435   03/28/23 1045  metroNIDAZOLE (FLAGYL) tablet 500 mg  Status:  Discontinued        500 mg Oral Every 12 hours 03/28/23 0955 04/03/23 0945   03/27/23 2330  vancomycin (VANCOCIN) 2,500 mg in sodium chloride 0.9 % 500 mL IVPB       Placed in "Followed by" Linked Group   2,500 mg 262.5 mL/hr over 120 Minutes Intravenous  Once 03/27/23 2257 03/28/23 0241   03/27/23 2300  metroNIDAZOLE (FLAGYL) IVPB 500 mg  Status:   Discontinued        500 mg 100 mL/hr over 60 Minutes Intravenous Every 12 hours 03/27/23 2247 03/28/23 0955   03/27/23 2300  ceFEPIme (MAXIPIME) 2 g in sodium chloride 0.9 % 100 mL IVPB  Status:  Discontinued        2 g 200 mL/hr over 30 Minutes Intravenous Every 12 hours 03/27/23 2257 04/17/23 1354   03/27/23 2100  ceFAZolin (ANCEF) IVPB 2g/100 mL premix        2 g 200 mL/hr over 30 Minutes Intravenous  Once 03/27/23 2058 03/27/23 2219          Medications  allopurinol  100 mg Oral Daily   amiodarone  200 mg Oral BID   carvedilol  3.125 mg Oral BID WC   Chlorhexidine Gluconate Cloth  6 each Topical Q0600   clopidogrel  75 mg Oral Q breakfast   docusate sodium  100 mg Oral BID   DULoxetine  60 mg Oral Daily   ezetimibe  10 mg Oral Daily   gabapentin  400 mg Oral TID   guaiFENesin  600 mg Oral BID   insulin aspart  0-9 Units Subcutaneous Q6H   insulin glargine  40 Units Subcutaneous Daily   levothyroxine  88 mcg Oral QAC breakfast   polyethylene glycol  17 g Oral Daily   sodium chloride flush  3 mL Intravenous Q12H   sodium chloride flush  3-10 mL Intravenous Q12H   tamsulosin  0.4 mg Oral Daily      Subjective:   Harold [name] was seen and examined today.  SOB somewhat better today but Cr still trending up, feels lousy today.   Objective:   Vitals:   04/17/23 1935 04/18/23 0319 04/18/23 0810 04/18/23 1204  BP: (!) 166/64 (!) 167/64 (!) 164/81   Pulse: 64 69 78   Resp: 16 18 20 20   Temp: 98.6 F (37 C) 97.7 F (36.5 C) 98.7 F (37.1 C) 98.5 F (36.9 C)  TempSrc: Oral Oral Oral Oral  SpO2: 96% 91% 94%   Weight:  131.2 kg    Height:        Intake/Output Summary (Last 24 hours) at 04/18/2023 1422 Last data filed at 04/18/2023 1205 Gross per 24 hour  Intake 960 ml  Output 2750 ml  Net -1790 ml     Wt Readings from Last 3 Encounters:  04/18/23 131.2 kg  01/28/23 (!) 142.9 kg  01/09/23 (!) 145 kg    Physical Exam General: Alert and oriented,  NAD Cardiovascular: S1 S2 clear, RRR.  Respiratory: Diminished breath sound at the bases Gastrointestinal: Soft, nontender, nondistended, NBS Ext: right leg dressing intact Neuro: no new deficits Psych: Normal affect     Data Reviewed:  I have personally reviewed following labs    CBC Lab Results  Component Value Date   WBC 10.2 04/18/2023   RBC 2.75 (L) 04/18/2023   HGB 8.3 (L) 04/18/2023   HCT 24.3 (L) 04/18/2023   MCV 88.4 04/18/2023   MCH 30.2 04/18/2023   PLT 117 (L) 04/18/2023   MCHC 34.2 04/18/2023   RDW 16.4 (H) 04/18/2023   LYMPHSABS 0.9 04/15/2023   MONOABS 0.6 04/15/2023   EOSABS 0.3 04/15/2023   BASOSABS 0.1 04/15/2023     Last metabolic panel Lab Results  Component Value Date   NA 137 04/18/2023   K 3.4 (L) 04/18/2023   CL 103 04/18/2023   CO2 19 (L) 04/18/2023   BUN 36 (H) 04/18/2023   CREATININE 3.38 (H) 04/18/2023   GLUCOSE 147 (H) 04/18/2023   GFRNONAA 19 (L) 04/18/2023   GFRAA 47 (L) 09/28/2019   CALCIUM 9.1 04/18/2023   PHOS 3.3 04/18/2023   PROT 5.8 (L) 04/16/2023   ALBUMIN 3.4 (L) 04/18/2023   LABGLOB 2.2 04/16/2023   AGRATIO 1.7 04/16/2023   BILITOT 0.6 04/16/2023   ALKPHOS 73 04/16/2023   AST 50 (H) 04/16/2023   ALT 42 04/16/2023   ANIONGAP 15 04/18/2023    CBG (last 3)  Recent Labs    04/18/23 0015 04/18/23 0557 04/18/23 1203  GLUCAP 144* 140* 143*      Coagulation Profile: No results for input(s): "INR", "PROTIME" in the last 168 hours.   Radiology Studies: I have personally reviewed the imaging studies  CT FEMUR LEFT WO CONTRAST Result Date: 04/17/2023 CLINICAL DATA:  Left lower thigh pain with firmness on palpation, evaluate for hematoma EXAM: CT OF THE LOWER LEFT EXTREMITY WITHOUT CONTRAST TECHNIQUE: Multidetector CT imaging of the lower left extremity was performed according to the [name] protocol. RADIATION DOSE REDUCTION: This exam was performed according to the departmental dose-optimization program which  includes automated exposure control, adjustment of the mA and/or kV according to patient size and/or use of iterative reconstruction technique. COMPARISON:  None Available. FINDINGS: Bones/Joint/Cartilage Degenerative changes of left hip joint are seen. No acute fracture or dislocation is noted. Mild degenerative changes about the knee joint noted. No knee joint effusion is seen. Ligaments Suboptimally assessed by CT. Muscles and Tendons Surrounding musculature shows no focal abnormality. Soft tissues Mild soft tissue edema is noted in the mid and distal thigh. No focal soft tissue mass lesion is noted. IMPRESSION: No acute abnormality to correspond with the physical exam. Mild subcutaneous edema is noted in the mid to distal thigh. Electronically Signed   By: Alcide Clever M.D.   On: 04/17/2023 23:29   DG CHEST PORT 1 VIEW Result Date: 04/17/2023 CLINICAL DATA:  Shortness of breath, cough. EXAM: PORTABLE CHEST 1 VIEW COMPARISON:  March 27, 2023.  FINDINGS: Stable cardiomediastinal silhouette. Increased bilateral perihilar and basilar opacities are noted concerning for edema or possibly infiltrate. Right internal jugular catheter is noted. Bony thorax is unremarkable. IMPRESSION: Bilateral perihilar and basilar opacities are noted concerning for edema or possibly pneumonia. Electronically Signed   By: Lupita Raider M.D.   On: 04/17/2023 11:24       Rustyn Conery M.D. [facility] 04/18/2023, 2:22 PM  Available via Epic secure chat 7am-7pm After 7 pm, please refer to night coverage provider listed on amion.

## 2023-04-18 NOTE — Plan of Care (Signed)
  Problem: Clinical Measurements: Goal: Ability to maintain clinical measurements within normal limits will improve Outcome: Progressing Goal: Will remain free from infection Outcome: Progressing Goal: Diagnostic test results will improve Outcome: Progressing Goal: Cardiovascular complication will be avoided Outcome: Progressing   

## 2023-04-18 NOTE — Progress Notes (Signed)
 Interventional Radiology Brief Note:  IR consulted for renal biopsy at the request of Nephrology. Patient with extended hospitalization in the setting of drug-resistant osteomyelitis and persistent AKI.   Unfortunately patient with several episodes of bleeding-- known to IR after prolonged bleeding from tunneled dialysis catheter placed 04/07/23.  Remains on Eliquis and Plavix at this time.  Discussed with medical team.  Decision made to hold on renal biopsy at this time. Order canceled.   Loyce Dys, MS RD PA-C

## 2023-04-18 NOTE — Progress Notes (Signed)
 [facility] Progress Note    Assessment/ Plan:   AKI on CKD, nonoliguric -Followed at our office for CKD care, baseline Cr in the 2s typically. Suspecting his current AKI is related to episode decreased renal perfusion in the context of orthostatic hypotension. ATN evident on urine sediment -Cr stable today 3.38 fortunately.  Nonoliguric -renal u/s unremarkable. urine with proteinuria/glucosuria, granular casts, 0-5 rbcs+wbcs/hpf. Moderate blood on dipstick however. CK WNL.  GN serologies negative thus far. -given concern for AIN and with nephrotic proteinuria, we had originally planned for renal biopsy however this is on hold given his extremely high risk for bleeding.  At this point risks outweigh benefits.  Irrespective of this, his antibiotics are on hold currently after discussing with ID. -no indication for renal replacement therapy at this junction -Avoid nephrotoxic medications including NSAIDs and iodinated intravenous contrast exposure unless the latter is absolutely indicated.  Preferred narcotic agents for pain control are hydromorphone, fentanyl, and methadone. Morphine should not be used. Avoid Baclofen and avoid oral sodium phosphate and magnesium citrate based laxatives / bowel preps. Continue strict Input and Output monitoring. Will monitor the patient closely with you and intervene or adjust therapy as indicated by changes in clinical status/labs    Orthostatic hypotension H/o HTN -recommend compression stockings and abd binder for now -orthostats neg 2/26   Edema SOB, acute on chronic dCHF exacerbation -CXR 2/27 with possible pulm edema, images personally reviewed. Possible pna however procal wnl -albumin stopped, status post Lasix 60 mg IV 2/27. Dose PRN, no need for today  Nephrotic range proteinuria -UPC 9.6g -biopsy on hold given high bleed risk -ANA, hep B surface antigen, HIV negative.  SPEP with M spike of 0.1 (immunofluorescence suspicious for  monoclonal immunoglobulin versus benign), Free light chain ratio 1.87 (expected given his history of CKD). Can revisit biopsy down the road when in a more stable state -checking UACR (to see if there is any discrepancy as compared to [facility]) and UPEP   RLE infection Hardware infection -s/p I&D, hardware removal 2/10, debridement 2/12 -on cefepime & dapto x 4 weeks originally.  On hold as of 2/28   Afib -on hep gtt currently   DM2 -per primary   Anemia due to chronic disease, thrombocytopenia -Transfuse for Hgb<7 g/dL. Hgb trended down. W/u per primary service esp since he is on a hep gtt   Acidosis -mild, likely related to AKI, monitor for now  Nausea/vomiting/constipation -Doubt this is secondary to uremia.  Likely related to constipation.  Per primary service.  Discussed with primary service, ID, Ortho  Subjective:   Patient seen and examined bedside. Having some abd pain, nausea/vomiting. Reports being very constipated. He reports that his breathing is better today. We held off on biopsy given that he is a high risk for bleeding, apparently he bleeds extremely easily. Urine output charted ~2 L.   Objective:   BP (!) 164/81 (BP Location: Left Arm)   Pulse 78   Temp 98.7 F (37.1 C) (Oral)   Resp 20   Ht 6\' 1"  (1.854 m)   Wt 131.2 kg   SpO2 94%   BMI 38.16 kg/m   Intake/Output Summary (Last 24 hours) at 04/18/2023 1203 Last data filed at 04/18/2023 4098 Gross per 24 hour  Intake 240 ml  Output 2050 ml  Net -1810 ml   Weight change: -4.2 kg  Physical Exam: Gen: NAD, in bed CVS: RRR Resp: diminished breath sounds bibasilar, unlabored Abd: soft, obese, distended Ext: minimal/trace LE  edema (unchanged) Neuro: awake, alert  Imaging: CT FEMUR LEFT WO CONTRAST Result Date: 04/17/2023 CLINICAL DATA:  Left lower thigh pain with firmness on palpation, evaluate for hematoma EXAM: CT OF THE LOWER LEFT EXTREMITY WITHOUT CONTRAST TECHNIQUE: Multidetector CT imaging of the  lower left extremity was performed according to the standard protocol. RADIATION DOSE REDUCTION: This exam was performed according to the departmental dose-optimization program which includes automated exposure control, adjustment of the mA and/or kV according to patient size and/or use of iterative reconstruction technique. COMPARISON:  None Available. FINDINGS: Bones/Joint/Cartilage Degenerative changes of left hip joint are seen. No acute fracture or dislocation is noted. Mild degenerative changes about the knee joint noted. No knee joint effusion is seen. Ligaments Suboptimally assessed by CT. Muscles and Tendons Surrounding musculature shows no focal abnormality. Soft tissues Mild soft tissue edema is noted in the mid and distal thigh. No focal soft tissue mass lesion is noted. IMPRESSION: No acute abnormality to correspond with the physical exam. Mild subcutaneous edema is noted in the mid to distal thigh. Electronically Signed   By: Alcide Clever M.D.   On: 04/17/2023 23:29   DG CHEST PORT 1 VIEW Result Date: 04/17/2023 CLINICAL DATA:  Shortness of breath, cough. EXAM: PORTABLE CHEST 1 VIEW COMPARISON:  March 27, 2023. FINDINGS: Stable cardiomediastinal silhouette. Increased bilateral perihilar and basilar opacities are noted concerning for edema or possibly infiltrate. Right internal jugular catheter is noted. Bony thorax is unremarkable. IMPRESSION: Bilateral perihilar and basilar opacities are noted concerning for edema or possibly pneumonia. Electronically Signed   By: Lupita Raider M.D.   On: 04/17/2023 11:24   ECHOCARDIOGRAM COMPLETE Result Date: 04/16/2023    ECHOCARDIOGRAM REPORT   Patient Name:   Harold [name] Date of Exam: 04/16/2023 Medical Rec #:  [id number]           Height:       73.0 in Accession #:    [id number]          Weight:       298.7 lb Date of Birth:  1955-08-23           BSA:          2.552 m Patient Age:    67 years            BP:           149/65 mmHg Patient Gender: M                    HR:           63 bpm. Exam Location:  Inpatient Procedure: 2D Echo, Color Doppler and Cardiac Doppler (Both Spectral and Color            Flow Doppler were utilized during procedure). Indications:    acute diastolic chf  History:        Patient has prior history of Echocardiogram examinations, most                 recent 02/21/2022. CAD, chronic kidney disease and PAD; Risk                 Factors:Hypertension, Diabetes, Dyslipidemia and Sleep Apnea.  Sonographer:    Delcie Roch RDCS Referring Phys: [phone] STEPHEN K CHIU IMPRESSIONS  1. Left ventricular ejection fraction, by estimation, is 55 to 60%. The left ventricle has normal function. The left ventricle demonstrates regional wall motion abnormalities with basal inferior hypokinesis. The left ventricular internal cavity size  was  mildly dilated. Left ventricular diastolic parameters are consistent with Grade II diastolic dysfunction (pseudonormalization).  2. Right ventricular systolic function is normal. The right ventricular size is mildly enlarged. There is mildly elevated pulmonary artery systolic pressure. The estimated right ventricular systolic pressure is 38.3 mmHg.  3. Left atrial size was moderately dilated.  4. Right atrial size was mildly dilated.  5. The mitral valve is normal in structure. Trivial mitral valve regurgitation. No evidence of mitral stenosis.  6. The aortic valve is tricuspid. There is mild calcification of the aortic valve. Aortic valve regurgitation is not visualized. No aortic stenosis is present.  7. The inferior vena cava is normal in size with greater than 50% respiratory variability, suggesting right atrial pressure of 3 mmHg. FINDINGS  Left Ventricle: Left ventricular ejection fraction, by estimation, is 55 to 60%. The left ventricle has normal function. The left ventricle demonstrates regional wall motion abnormalities. The left ventricular internal cavity size was mildly dilated. There is no left  ventricular hypertrophy. Left ventricular diastolic parameters are consistent with Grade II diastolic dysfunction (pseudonormalization). Right Ventricle: The right ventricular size is mildly enlarged. No increase in right ventricular wall thickness. Right ventricular systolic function is normal. There is mildly elevated pulmonary artery systolic pressure. The tricuspid regurgitant velocity is 2.97 m/s, and with an assumed right atrial pressure of 3 mmHg, the estimated right ventricular systolic pressure is 38.3 mmHg. Left Atrium: Left atrial size was moderately dilated. Right Atrium: Right atrial size was mildly dilated. Pericardium: There is no evidence of pericardial effusion. Mitral Valve: The mitral valve is normal in structure. Mild mitral annular calcification. Trivial mitral valve regurgitation. No evidence of mitral valve stenosis. Tricuspid Valve: The tricuspid valve is normal in structure. Tricuspid valve regurgitation is trivial. Aortic Valve: The aortic valve is tricuspid. There is mild calcification of the aortic valve. Aortic valve regurgitation is not visualized. No aortic stenosis is present. Pulmonic Valve: The pulmonic valve was normal in structure. Pulmonic valve regurgitation is not visualized. Aorta: The aortic root is normal in size and structure. Venous: The inferior vena cava is normal in size with greater than 50% respiratory variability, suggesting right atrial pressure of 3 mmHg. IAS/Shunts: No atrial level shunt detected by color flow Doppler.  LEFT VENTRICLE PLAX 2D LVIDd:         6.30 cm   Diastology LVIDs:         4.80 cm   LV e' medial:    7.94 cm/s LV PW:         1.20 cm   LV E/e' medial:  16.8 LV IVS:        1.20 cm   LV e' lateral:   10.00 cm/s LVOT diam:     2.40 cm   LV E/e' lateral: 13.3 LV SV:         99 LV SV Index:   39 LVOT Area:     4.52 cm  RIGHT VENTRICLE             IVC RV Basal diam:  3.00 cm     IVC diam: 1.70 cm RV S prime:     12.80 cm/s TAPSE (M-mode): 2.3 cm LEFT  ATRIUM              Index        RIGHT ATRIUM           Index LA diam:        5.20 cm  2.04 cm/m  RA Area:     19.30 cm LA Vol (A2C):   125.0 ml 48.99 ml/m  RA Volume:   51.10 ml  20.03 ml/m LA Vol (A4C):   106.0 ml 41.54 ml/m LA Biplane Vol: 123.0 ml 48.20 ml/m  AORTIC VALVE LVOT Vmax:   96.10 cm/s LVOT Vmean:  64.300 cm/s LVOT VTI:    0.219 m  AORTA Ao Root diam: 3.40 cm Ao Asc diam:  3.60 cm MITRAL VALVE                TRICUSPID VALVE MV Area (PHT): 3.72 cm     TR Peak grad:   35.3 mmHg MV Decel Time: 204 msec     TR Vmax:        297.00 cm/s MV E velocity: 133.00 cm/s MV A velocity: 50.10 cm/s   SHUNTS MV E/A ratio:  2.65         Systemic VTI:  0.22 m                             Systemic Diam: 2.40 cm Dalton McleanMD Electronically signed by Wilfred Lacy Signature Date/Time: 04/16/2023/2:24:56 PM    Final     Labs: BMET Recent Labs  Lab 04/12/23 5784 04/13/23 0815 04/14/23 0423 04/15/23 0357 04/16/23 0752 04/17/23 1005 04/18/23 0542  NA 138 137 138 137 140 142 137  K 3.9 3.8 3.7 3.7 3.5 4.5 3.4*  CL 101 106 104 108 107 108 103  CO2 24 24 23  21* 24 23 19*  GLUCOSE 168* 170* 154* 192* 143* 124* 147*  BUN 33* 35* 36* 36* 36* 33* 36*  CREATININE 2.62* 2.85* 2.98* 3.04* 3.20* 3.21* 3.38*  CALCIUM 9.5 8.7* 8.8* 8.4* 8.9 9.3 9.1  PHOS  --   --   --   --   --  3.3 3.3   CBC Recent Labs  Lab 04/15/23 1452 04/16/23 0752 04/17/23 1003 04/18/23 0542  WBC 8.0 5.3 8.7 10.2  NEUTROABS 6.0  --   --   --   HGB 9.2* 8.0* 8.0* 8.3*  HCT 27.0* 24.0* 24.1* 24.3*  MCV 86.3 88.6 88.3 88.4  PLT 168 121* 109* 117*    Medications:     allopurinol  100 mg Oral Daily   amiodarone  200 mg Oral BID   carvedilol  3.125 mg Oral BID WC   Chlorhexidine Gluconate Cloth  6 each Topical Q0600   clopidogrel  75 mg Oral Q breakfast   docusate sodium  100 mg Oral BID   DULoxetine  60 mg Oral Daily   ezetimibe  10 mg Oral Daily   gabapentin  400 mg Oral TID   guaiFENesin  600 mg Oral BID    insulin aspart  0-9 Units Subcutaneous Q6H   insulin glargine  40 Units Subcutaneous Daily   levothyroxine  88 mcg Oral QAC breakfast   polyethylene glycol  17 g Oral Daily   sodium chloride flush  3 mL Intravenous Q12H   sodium chloride flush  3-10 mL Intravenous Q12H      Anthony Sar, MD [facility] 04/18/2023, 12:03 PM

## 2023-04-19 ENCOUNTER — Inpatient Hospital Stay (HOSPITAL_COMMUNITY)

## 2023-04-19 DIAGNOSIS — J101 Influenza due to other identified influenza virus with other respiratory manifestations: Secondary | ICD-10-CM | POA: Diagnosis not present

## 2023-04-19 DIAGNOSIS — R29810 Facial weakness: Secondary | ICD-10-CM

## 2023-04-19 LAB — RENAL FUNCTION PANEL
Albumin: 3 g/dL — ABNORMAL LOW (ref 3.5–5.0)
Anion gap: 16 — ABNORMAL HIGH (ref 5–15)
BUN: 39 mg/dL — ABNORMAL HIGH (ref 8–23)
CO2: 17 mmol/L — ABNORMAL LOW (ref 22–32)
Calcium: 9 mg/dL (ref 8.9–10.3)
Chloride: 100 mmol/L (ref 98–111)
Creatinine, Ser: 3.56 mg/dL — ABNORMAL HIGH (ref 0.61–1.24)
GFR, Estimated: 18 mL/min — ABNORMAL LOW (ref >60)
Glucose, Bld: 172 mg/dL — ABNORMAL HIGH (ref 70–99)
Phosphorus: 3.2 mg/dL (ref 2.5–4.6)
Potassium: 3 mmol/L — ABNORMAL LOW (ref 3.5–5.1)
Sodium: 133 mmol/L — ABNORMAL LOW (ref 135–145)

## 2023-04-19 LAB — CBC
HCT: 23.8 % — ABNORMAL LOW (ref 39.0–52.0)
Hemoglobin: 8.1 g/dL — ABNORMAL LOW (ref 13.0–17.0)
MCH: 29.9 pg (ref 26.0–34.0)
MCHC: 34 g/dL (ref 30.0–36.0)
MCV: 87.8 fL (ref 80.0–100.0)
Platelets: 134 10*3/uL — ABNORMAL LOW (ref 150–400)
RBC: 2.71 MIL/uL — ABNORMAL LOW (ref 4.22–5.81)
RDW: 16.5 % — ABNORMAL HIGH (ref 11.5–15.5)
WBC: 14.1 10*3/uL — ABNORMAL HIGH (ref 4.0–10.5)
nRBC: 0 % (ref 0.0–0.2)

## 2023-04-19 LAB — HEPARIN LEVEL (UNFRACTIONATED): Heparin Unfractionated: 0.74 [IU]/mL — ABNORMAL HIGH (ref 0.30–0.70)

## 2023-04-19 LAB — GLUCOSE, CAPILLARY
Glucose-Capillary: 155 mg/dL — ABNORMAL HIGH (ref 70–99)
Glucose-Capillary: 171 mg/dL — ABNORMAL HIGH (ref 70–99)
Glucose-Capillary: 171 mg/dL — ABNORMAL HIGH (ref 70–99)
Glucose-Capillary: 192 mg/dL — ABNORMAL HIGH (ref 70–99)
Glucose-Capillary: 192 mg/dL — ABNORMAL HIGH (ref 70–99)

## 2023-04-19 LAB — HEPATITIS B CORE ANTIBODY, TOTAL: HEP B CORE AB: NEGATIVE

## 2023-04-19 LAB — APTT: aPTT: 67 s — ABNORMAL HIGH (ref 24–36)

## 2023-04-19 MED ORDER — POTASSIUM CHLORIDE CRYS ER 20 MEQ PO TBCR
40.0000 meq | EXTENDED_RELEASE_TABLET | Freq: Once | ORAL | Status: AC
  Administered 2023-04-19: 40 meq via ORAL
  Filled 2023-04-19: qty 2

## 2023-04-19 MED ORDER — CALCIUM CARBONATE ANTACID 500 MG PO CHEW
1.0000 | CHEWABLE_TABLET | Freq: Three times a day (TID) | ORAL | Status: DC | PRN
  Administered 2023-04-19: 200 mg via ORAL
  Filled 2023-04-19: qty 1

## 2023-04-19 MED ORDER — GABAPENTIN 300 MG PO CAPS
300.0000 mg | ORAL_CAPSULE | Freq: Two times a day (BID) | ORAL | Status: DC
  Administered 2023-04-19 – 2023-04-21 (×4): 300 mg via ORAL
  Filled 2023-04-19 (×4): qty 1

## 2023-04-19 NOTE — Progress Notes (Signed)
 Occupational Therapy Treatment Patient Details Name: Harold [name] MRN: [id number] DOB: May 17, 1955 Today's Date: 04/19/2023   History of present illness 68 y.o. male presents to [facility] 03/27/23 with productive cough and generalized weakness. Admitted with influenza A and cellulitis of R foot s/p hardware removal of the R ankle and application of wound vac on 2/10, wound vac removed 2/27. Left thigh pain, CT negative. Prior admit 11/24 for R ankle ORIF and in 12/24 for revision. S/p excisional debridement of right ankle on 2/12. Pt underwent central line placement on 04/07/2023 however he has been having issues with recurrent bleeding from the insertion site which has finally stopped. 04/17/23 pt with drop in sats requiring 2 L of O2 via , chest xray concerning for edema or possibly pneumonia.Significant PMH: HTN, HLD, CAD s/p PCI, COPD, DM2, CKD stage IIIb, PVD, gout, OSA, neuropathy, COPD, a-fib, CAD s/p percutaneous coronary angioplasty, angina, obesity   OT comments  Pt. Seen for skilled OT treatment session.  Reluctant to participate with c/o weakness but eventually agreeable with encouragement.  Bed mobility mod a towards eob with initial mod/max a in sitting to gain un supported sitting balance.  Attempts to scoot towards hob in prep for back to bed as pt with c/o nausea and fatigue.  Pt. Max back to bed max education and cues for safety.  Able to assist with BUEs and LLE to pull self up in bed with boost bed function and therapist asst. Using pads.  Education and encouragement of benefits of sitting up and getting to recliner later today.  Cont. With acute OT POC.        If plan is discharge home, recommend the following:  A lot of help with walking and/or transfers;A lot of help with bathing/dressing/bathroom;Assistance with cooking/housework;Help with stairs or ramp for entrance;Assist for transportation   Equipment Recommendations       Recommendations for Other Services       Precautions / Restrictions Precautions Precautions: Fall Recall of Precautions/Restrictions: Impaired Precaution/Restrictions Comments: Watch BP Required Braces or Orthoses: Other Brace Other Brace: R CAM boot for ambulation, R splint in bed, abdominal binder (Prn) Restrictions RUE Weight Bearing Per Provider Order: Touch down weight bearing RLE Weight Bearing Per Provider Order: Weight bearing as tolerated Other Position/Activity Restrictions: per Dr. Audrie Lia note 04/03/2023 and orders 04/03/2023 pt WBAT       Mobility Bed Mobility Overal bed mobility: Needs Assistance Bed Mobility: Supine to Sit, Sit to Supine, Rolling Rolling: Min assist   Supine to sit: HOB elevated, Used rails, Mod assist Sit to supine: Max assist, Used rails   General bed mobility comments: modA for trunk management and pad scoot for pt to come to seated EoB with pt use of bedrail.  seated eob. heavy initial trunk support, max encouragement to sit upright without support.  attempted scoots towards hob x2. pt. wanting sip of water then c/o nausea.  laid back on bed without alerting therapist asst., (pillow had been placed as safety measure in front of opposing lower rail).  pt. states "im sorry, im sorry". reviewed its ok he just needs to communicate with me so i can help him.  utilized bed functions and pad to get pt. back in bed and once in bed pt. able to use BUEs and LLE to assist with pulling up in bed with boost function.    Transfers  Balance                                           ADL either performed or assessed with clinical judgement   ADL                                              Extremity/Trunk Assessment              Vision       Perception     Praxis     Communication     Cognition Arousal: Alert Behavior During Therapy: Flat affect Cognition: No apparent impairments                                         Cueing   Cueing Techniques: Verbal cues, Tactile cues  Exercises      Shoulder Instructions       General Comments      Pertinent Vitals/ Pain       Pain Assessment Pain Assessment: Faces Pain Location: nausea Pain Intervention(s): Limited activity within patient's tolerance, Monitored during session, Repositioned  Home Living                                          Prior Functioning/Environment              Frequency  Min 1X/week        Progress Toward Goals  OT Goals(current goals can now be found in the care plan section)  Progress towards OT goals: Progressing toward goals     Plan      Co-evaluation                 AM-PAC OT "6 Clicks" Daily Activity     Outcome Measure   Help from another person eating meals?: None Help from another person taking care of personal grooming?: A Little Help from another person toileting, which includes using toliet, bedpan, or urinal?: A Lot Help from another person bathing (including washing, rinsing, drying)?: A Lot Help from another person to put on and taking off regular upper body clothing?: A Little Help from another person to put on and taking off regular lower body clothing?: Total 6 Click Score: 15    End of Session    OT Visit Diagnosis: Unsteadiness on feet (R26.81);Other abnormalities of gait and mobility (R26.89);Muscle weakness (generalized) (M62.81);Pain Pain - Right/Left: Left   Activity Tolerance Patient limited by fatigue   Patient Left in bed;with call bell/phone within reach;with bed alarm set   Nurse Communication Other (comment) (rn states ok to work with pt., in room at end of session, assited with occluded IV.  reviewed with rn pt. c/o nausea and assisted back to bed but wants to sit in recliner later. pt. declined need for nausea meds that rn offered)        Time: 0347-4259 OT Time Calculation (min): 18 min  Charges: OT General  Charges $OT Visit: 1 Visit OT Treatments $Therapeutic Activity: 8-22 mins  Boneta Lucks, COTA/L Acute Rehabilitation [phone]  Alessandra Bevels Lorraine-COTA/L 04/19/2023, 10:15 AM

## 2023-04-19 NOTE — Progress Notes (Signed)
   04/19/23 2326  BiPAP/CPAP/SIPAP  Reason BIPAP/CPAP not in use Non-compliant (pt refused)

## 2023-04-19 NOTE — Plan of Care (Signed)
  Problem: Elimination: Goal: Will not experience complications related to bowel motility Outcome: Progressing Goal: Will not experience complications related to urinary retention Outcome: Progressing   Problem: Pain Managment: Goal: General experience of comfort will improve and/or be controlled Outcome: Progressing

## 2023-04-19 NOTE — Progress Notes (Signed)
 RN was told by the Secretary that pt is stating he has a facial droop. When RN entered the room, pt stated to have weakness in his right arm and mild facial droop. Pt's husband was on the phone and stated that he noticed it 5 minutes back around 1250 while he was on call. He also noticed pt slurring, and weakness in pt's right arm. Pt was last time seen by staff was 12. RN called the Code Stroke and Charge RN called Rapid Response. Dr. Alvino Chapel, Rapid response, Charge RN are by the bedside.

## 2023-04-19 NOTE — Progress Notes (Signed)
 [facility]                                                                              Harold [name], is a 68 y.o. male, DOB - August 25, 1955, WUJ:[id number] Admit date - 03/27/2023    Outpatient Primary MD for the patient is [name], MD  LOS - 23  days  Chief Complaint  Patient presents with   Cough       Brief summary   Patient is a 68 year old male with morbid obesity, OSA, DM2, HTN, HLD, CAD/stent, A-fib on Eliquis, carotid artery stenosis, CKD, chronic anemia, anxiety/depression, diabetic neuropathy, GERD, hypothyroidism, COPD, pulmonary hypertension. 2/6, patient presented to the ED with complaint of cough, subjective fever, myalgia, generalized weakness for 5 days. Respiratory virus panel positive for influenza A. Chest x-ray negative for infiltrates. Also noted to have right leg cellulitis.  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 -S/p right ankle hardware removal, I&D and wound VAC placement on 2/10 by Dr. Jena Gauss -S/p repeat debridement of the right ankle by Dr. Lajoyce Corners 04/02/2023.   -ID following, changed to cefepime to ertapenem, continue daptomycin however given the concern for possible AIN from antibiotics, causing AKI, ID considering holding the antibiotics -Dr. Lajoyce Corners to evaluate the wound -PT recommended SNF however patient will have to pay out-of-pocket and unable to afford. -Now off antibiotics   AKI on CKD stage IIIb Nephrotic range proteinuria   -Baseline creatinine appears to be 1.9-2.2.   -Creatinine peaked to 3.1 on 2/19 that started to trend up after increasing lasix dose to 80mg  -Lasix placed on hold however creatinine continued to trend up, nephrology was consulted -Has received IV albumin, then noted to be congested, elevated BNP 765.1.  Chest x-ray showed bilateral  perihilar and basilar opacities, edema versus pneumonia. -Creatinine trending upward today, initial plan for renal biopsy today however patient has been on Plavix which will need to be held if for 5 days.  -Per renal, given concern for AIN and with nephrotic proteinuria, initially planned for renal biopsy however now on hold given high risk of bleeding and patient has been on Plavix.  -Nephrology following  Right-sided facial droop -After my evaluation in the morning, I was notified by RN that patient complained of right facial droop.  Patient was re-evaluated, rapid response nurse at bedside.  Stroke team workup and imaging pending.  He is on IV heparin   PAD -Seen by vascular surgery underwent aortogram, right leg angiogram, successful stenting of right superficial femoral artery on 04/01/2023.   -Patient on Plavix and heparin gtt  -On 12/27, patient complained of left thigh pain with area of tenderness noted above knee.  -CT of the left femur negative for hematoma.   Depression ?Acute metabolic encephalopathy  -Continue Cymbalta -Partner feels cognitive dysfunction, hand tremors in the last 3 days, ?cefepime induced encephalopathy. Cognitive dysfunction ongoing for months, worsening per patient's partner  -Cefepime now discontinued  Acute diarrhea -Resolved   Influenza A -Improved   Type 2 diabetes mellitus /  Diabetic neuropathy, POA -A1c 7 on 01/02/2023. PTA meds-Tresiba 60 units daily, Mounjaro -Currently on Semglee, sliding scale insulin  CBG (last 3)  Recent Labs    04/19/23 0559 04/19/23 0815 04/19/23 1313  GLUCAP 155* 171* 192*    Diabetic neuropathy -PTA reportedly on gabapentin 1600 mg 3 times daily. Since dose was unable to be confirmed, pt was continued on 400 mg 3 times daily instead    Paroxysmal A-fib Rates controlled on PTA meds Coreg, amiodarone  -CHADS-VASc of 4 -Eliquis held secondary to bleeding from CVL recently -Heparin drip    Essential  hypertension -PTA meds- Coreg 3.125 mg twice daily, Lasix 160 mg daily -Currently on Coreg, amlodipine. -Lasix held due to AKI   Anemia of chronic disease: Baseline hemoglobin between 10.5 11.5.   -Hemoglobin stable   CAD/stent Carotid artery stenosis HLD Continue PTA meds -Plavix, Crestor -Eliquis held for now, continue heparin gtt and monitor for any bleeding issues  Hypothyroidism -Synthroid   Obstructive sleep apnea -Patient reported good compliance on home nocturnal CPAP   Gout -Continue allopurinol, colchicine   Orthostatic hypotension -Received brief IV fluids, continue abdominal binder -2D echo showed EF 55 to 60%, basal inferior hypokinesis, G2 DD   Moderate obesity, class II Estimated body mass index is 38.02 kg/m as calculated from the following:   Height as of this encounter: 6\' 1"  (1.854 m).   Weight as of this encounter: 130.7 kg.  Code Status: Full code DVT Prophylaxis: SCD  Level of Care: Level of care: Progressive Cardiac Family Communication: None at bedside Disposition Plan:    Remains inpatient appropriate:      Procedures:  2D echo  Consultants:   Renal Ortho ID  Neuro  Antimicrobials:   Anti-infectives (From admission, onward)    Start     Dose/Rate Route Frequency Ordered Stop   04/19/23 1000  ertapenem (INVANZ) 500 mg in sodium chloride 0.9 % 50 mL IVPB  Status:  Discontinued        500 mg 100 mL/hr over 30 Minutes Intravenous Daily 04/18/23 0931 04/18/23 1106   04/17/23 1800  ertapenem (INVANZ) 1 g in sodium chloride 0.9 % 100 mL IVPB  Status:  Discontinued        1 g 200 mL/hr over 30 Minutes Intravenous Daily 04/17/23 1354 04/18/23 0931   04/08/23 0000  daptomycin (CUBICIN) IVPB  Status:  Discontinued        700 mg Intravenous Every 24 hours 04/08/23 1147 04/08/23    04/08/23 0000  ceFEPime (MAXIPIME) IVPB  Status:  Discontinued        2 g Intravenous Every 12 hours 04/08/23 1147 04/18/23    04/08/23 0000  daptomycin  (CUBICIN) IVPB  Status:  Discontinued        700 mg Intravenous Every 24 hours 04/08/23 1150 04/18/23    04/04/23 1400  DAPTOmycin (CUBICIN) IVPB 700 mg/120mL premix  Status:  Discontinued        700 mg 200 mL/hr over 30 Minutes Intravenous Daily 04/03/23 1435 04/18/23 1106   04/02/23 1715  ceFAZolin (ANCEF) IVPB 2g/100 mL premix  Status:  Discontinued        2 g 200 mL/hr over 30 Minutes Intravenous Every 8 hours 04/02/23 1616 04/02/23 1623   04/02/23 0600  ceFAZolin (ANCEF) IVPB 3g/150 mL premix  Status:  Discontinued        3 g 300 mL/hr over 30 Minutes Intravenous On call to O.R. 04/01/23 1700 04/02/23 1549   03/31/23 1015  ceFAZolin (ANCEF) IVPB 2g/100 mL premix        2 g 200 mL/hr over 30 Minutes Intravenous On call to O.R. 03/31/23 0921 03/31/23 1203   03/29/23 2200  vancomycin (VANCOREADY) IVPB 2000 mg/400 mL  Status:  Discontinued       Placed in "Followed by" Linked Group   2,000 mg 200 mL/hr over 120 Minutes Intravenous Every 48 hours 03/27/23 2257 04/03/23 1435   03/28/23 1045  metroNIDAZOLE (FLAGYL) tablet 500 mg  Status:  Discontinued        500 mg Oral Every 12 hours 03/28/23 0955 04/03/23 0945   03/27/23 2330  vancomycin (VANCOCIN) 2,500 mg in sodium chloride 0.9 % 500 mL IVPB       Placed in "Followed by" Linked Group   2,500 mg 262.5 mL/hr over 120 Minutes Intravenous  Once 03/27/23 2257 03/28/23 0241   03/27/23 2300  metroNIDAZOLE (FLAGYL) IVPB 500 mg  Status:  Discontinued        500 mg 100 mL/hr over 60 Minutes Intravenous Every 12 hours 03/27/23 2247 03/28/23 0955   03/27/23 2300  ceFEPIme (MAXIPIME) 2 g in sodium chloride 0.9 % 100 mL IVPB  Status:  Discontinued        2 g 200 mL/hr over 30 Minutes Intravenous Every 12 hours 03/27/23 2257 04/17/23 1354   03/27/23 2100  ceFAZolin (ANCEF) IVPB 2g/100 mL premix        2 g 200 mL/hr over 30 Minutes Intravenous  Once 03/27/23 2058 03/27/23 2219          Medications  allopurinol  100 mg Oral Daily    amiodarone  200 mg Oral BID   carvedilol  3.125 mg Oral BID WC   Chlorhexidine Gluconate Cloth  6 each Topical Q0600   clopidogrel  75 mg Oral Q breakfast   docusate sodium  100 mg Oral BID   DULoxetine  60 mg Oral Daily   ezetimibe  10 mg Oral Daily   gabapentin  400 mg Oral TID   guaiFENesin  600 mg Oral BID   insulin aspart  0-9 Units Subcutaneous Q6H   insulin glargine  40 Units Subcutaneous Daily   levothyroxine  88 mcg Oral QAC breakfast   polyethylene glycol  17 g Oral Daily   sodium chloride flush  3 mL Intravenous Q12H   sodium chloride flush  3-10 mL Intravenous Q12H   tamsulosin  0.4 mg Oral Daily      Subjective:   From my initial evaluation in the morning, patient had no physical complaints  Objective:   Vitals:   04/19/23 1352 04/19/23 1412 04/19/23 1426 04/19/23 1428  BP: (!) 152/60 (!) 155/72 (!) 131/57 117/65  Pulse: 74 74 75 80  Resp: 20     Temp: 99 F (37.2 C)     TempSrc: Oral     SpO2: 93% 91% 96% 96%  Weight:      Height:        Intake/Output Summary (Last 24 hours) at 04/19/2023 1440 Last data filed at 04/19/2023 0431 Gross per 24 hour  Intake 278.39 ml  Output 1600 ml  Net -1321.61 ml     Wt Readings from Last 3 Encounters:  04/19/23 130.7 kg  01/28/23 (!) 142.9 kg  01/09/23 (!) 145 kg    Examination(initial evaluation in the morning) General exam: Appears calm and comfortable  Respiratory system: Respiratory effort normal. Cardiovascular system: S1 & S2 heard.  Gastrointestinal system: Abdomen is nondistended, soft  Central  nervous system: Alert and oriented.  Extremities: Right lower extremity in boot   Data Reviewed:  I have personally reviewed following labs    CBC Lab Results  Component Value Date   WBC 14.1 (H) 04/19/2023   RBC 2.71 (L) 04/19/2023   HGB 8.1 (L) 04/19/2023   HCT 23.8 (L) 04/19/2023   MCV 87.8 04/19/2023   MCH 29.9 04/19/2023   PLT 134 (L) 04/19/2023   MCHC 34.0 04/19/2023   RDW 16.5 (H) 04/19/2023    LYMPHSABS 0.9 04/15/2023   MONOABS 0.6 04/15/2023   EOSABS 0.3 04/15/2023   BASOSABS 0.1 04/15/2023     Last metabolic panel Lab Results  Component Value Date   NA 133 (L) 04/19/2023   K 3.0 (L) 04/19/2023   CL 100 04/19/2023   CO2 17 (L) 04/19/2023   BUN 39 (H) 04/19/2023   CREATININE 3.56 (H) 04/19/2023   GLUCOSE 172 (H) 04/19/2023   GFRNONAA 18 (L) 04/19/2023   GFRAA 47 (L) 09/28/2019   CALCIUM 9.0 04/19/2023   PHOS 3.2 04/19/2023   PROT 5.8 (L) 04/16/2023   ALBUMIN 3.0 (L) 04/19/2023   LABGLOB 2.2 04/16/2023   AGRATIO 1.7 04/16/2023   BILITOT 0.6 04/16/2023   ALKPHOS 73 04/16/2023   AST 50 (H) 04/16/2023   ALT 42 04/16/2023   ANIONGAP 16 (H) 04/19/2023    CBG (last 3)  Recent Labs    04/19/23 0559 04/19/23 0815 04/19/23 1313  GLUCAP 155* 171* 192*      Coagulation Profile: No results for input(s): "INR", "PROTIME" in the last 168 hours.   Radiology Studies: I have personally reviewed the imaging studies  CT HEAD CODE STROKE WO CONTRAST Result Date: 04/19/2023 CLINICAL DATA:  Code stroke. Acute onset of right upper extremity weakness and facial droop. Abnormal speech. EXAM: CT HEAD WITHOUT CONTRAST TECHNIQUE: Contiguous axial images were obtained from the base of the skull through the vertex without intravenous contrast. RADIATION DOSE REDUCTION: This exam was performed according to the departmental dose-optimization program which includes automated exposure control, adjustment of the mA and/or kV according to patient size and/or use of iterative reconstruction technique. COMPARISON:  CT head without contrast 01/09/2023 FINDINGS: Brain: Mild atrophy and white matter changes are similar the prior exam. No acute infarct, hemorrhage, or mass lesion is present. The ventricles are of normal size. No significant extraaxial fluid collection is present. Midline structures are within normal limits. The brainstem and cerebellum are within normal limits. Vascular:  Atherosclerotic calcifications are present within the cavernous internal carotid arteries and at the dural margins of both vertebral arteries bilaterally. Skull: Calvarium is intact. No focal lytic or blastic lesions are present. No significant extracranial soft tissue lesion is present. Sinuses/Orbits: Small polyps or mucous retention cysts are present along the inferior maxillary sinuses bilaterally. No fluid levels are present. The paranasal sinuses and mastoid air cells are otherwise clear. ASPECTS [facility] Stroke Program Early CT Score) - Ganglionic level infarction (caudate, lentiform nuclei, internal capsule, insula, M1-M3 cortex): 7/7 - Supraganglionic infarction (M4-M6 cortex): 3/3 Total score (0-10 with 10 being normal): /10 IMPRESSION: 1. No acute intracranial abnormality or significant interval change. 2. Stable atrophy and white matter disease. This likely reflects the sequela of chronic microvascular ischemia. 3. Aspects is 10. The above was relayed via text pager to Dr. Brooke Dare on 04/19/2023 at 13:39 . Electronically Signed   By: Marin Roberts M.D.   On: 04/19/2023 13:39   CT FEMUR LEFT WO CONTRAST Result Date: 04/17/2023 CLINICAL DATA:  Left lower thigh pain with firmness on palpation, evaluate for hematoma EXAM: CT OF THE LOWER LEFT EXTREMITY WITHOUT CONTRAST TECHNIQUE: Multidetector CT imaging of the lower left extremity was performed according to the standard protocol. RADIATION DOSE REDUCTION: This exam was performed according to the departmental dose-optimization program which includes automated exposure control, adjustment of the mA and/or kV according to patient size and/or use of iterative reconstruction technique. COMPARISON:  None Available. FINDINGS: Bones/Joint/Cartilage Degenerative changes of left hip joint are seen. No acute fracture or dislocation is noted. Mild degenerative changes about the knee joint noted. No knee joint effusion is seen. Ligaments Suboptimally  assessed by CT. Muscles and Tendons Surrounding musculature shows no focal abnormality. Soft tissues Mild soft tissue edema is noted in the mid and distal thigh. No focal soft tissue mass lesion is noted. IMPRESSION: No acute abnormality to correspond with the physical exam. Mild subcutaneous edema is noted in the mid to distal thigh. Electronically Signed   By: Alcide Clever M.D.   On: 04/17/2023 23:29      Noralee Stain, DO [facility] 04/19/2023, 2:45 PM   Available via Epic secure chat 7am-7pm After these hours, please refer to coverage provider listed on [email or web address]

## 2023-04-19 NOTE — Code Documentation (Signed)
 Stroke Response Nurse Documentation Code Documentation  Harold [name] is a 68 y.o. male with history of afib on Eliquis chronically (currently on Hep gtt); DM, HTN, CKD IV. Patient admitted 03/27/23 with influenza A, complicated by right ankle infection needing debridement. LKW 1200 after RN with patient, then approximately 1255 he was on facetime with his husband and it was noted he had slurred speech and a right facial droop. Staff was notified and code stroke activated.   Stroke team to bedside, patient taken to CT. NIH 4, see flowsheets for details. CT head completed. No TNK, exam not consistent with LVO. Care/Plan: q2h NIH/vitals, MRI. Hold heparin gtt until MRI complete. Bedside handoff with RN Jessica/Poonam.    Truddie Crumble  Rapid Response RN

## 2023-04-19 NOTE — Consult Note (Signed)
 NEUROLOGY CONSULT NOTE   Date of service: April 19, 2023 Patient Name: Harold [name] MRN:  [id number] DOB:  Jul 21, 1955 Chief Complaint: "Code Stroke" Requesting Provider: Noralee Stain, DO  History of Present Illness  Harold [name] is a 68 y.o. male with hx of paroxysmal atrial fibrillation chronically anticoagulated on Eliquis, type 2 diabetes mellitus, essential hypertension, stage IV CKD with baseline creatinine 2.1-2.6, who is admitted to [facility] on 03/27/2023 with influenza A. During the hospitalization he was found to have an infection in his right ankle and had to have a debridement with the hardware removed. He is currently on a heparin gtt, has a wound vac. LKW 1200.    Code stroke ACTIVATED for right facial droop and slurred speech  LKW: 1200 Modified rankin score: 3-Moderate disability-requires help but walks WITHOUT assistance IV Thrombolysis: No, on heparin IV EVT: No, no LVO  NIHSS components Score: Comment  1a Level of Conscious 0[x]  1[]  2[]  3[]      1b LOC Questions 0[x]  1[]  2[]       1c LOC Commands 0[x]  1[]  2[]       2 Best Gaze 0[x]  1[]  2[]       3 Visual 0[x]  1[]  2[]  3[]      4 Facial Palsy 0[]  1[x]  2[]  3[]      5a Motor Arm - left 0[x]  1[]  2[]  3[]  4[]  UN[]    5b Motor Arm - Right 0[x]  1[]  2[]  3[]  4[]  UN[]    6a Motor Leg - Left 0[]  1[x]  2[]  3[]  4[]  UN[]    6b Motor Leg - Right 0[]  1[x]  2[]  3[]  4[]  UN[]    7 Limb Ataxia 0[x]  1[]  2[]  3[]  UN[]     8 Sensory 0[x]  1[]  2[]  UN[]      9 Best Language 0[x]  1[]  2[]  3[]      10 Dysarthria 0[]  1[x]  2[]  UN[]      11 Extinct. and Inattention 0[x]  1[]  2[]       TOTAL: 4      ROS  Limited review of systems in the acute setting and given patient fairly slow to respond to questions  Past History   Past Medical History:  Diagnosis Date   Anemia    Anginal pain (HCC) 04/2013   Cardiac cath showed patent stents with distal LAD, circumflex-OM and RCA disease in small vessels.   Anxiety    Atrial fibrillation (HCC)  12/2021   CAD S/P percutaneous coronary angioplasty 2003, 04/2012   status post PCI to LAD, circumflex-OM 2, RCA   Carotid artery occlusion    Chronic renal insufficiency, stage II (mild)    Chronic venous insufficiency    varicosities, no reflux; dopplers 04/14/12- valvular insufficiency in the R and L GSV   Complication of anesthesia    COPD, mild (HCC)    Depression    situaltional    Diabetes mellitus    Diabetic neuropathy (HCC) 08/24/2019   Dyslipidemia associated with type 2 diabetes mellitus (HCC)    GERD (gastroesophageal reflux disease)    History of cardioversion 12/2021   at [facility]   HTN (hypertension)    Hypothyroidism    Neuromuscular disorder (HCC)    neuropathy in feet   Neuropathy    notably improved following PCI with improved cardiac function   Obesity    Obesity, Class II, BMI 35.0-39.9, with comorbidity (see actual BMI)    BMI 39; wgt loss efforts in place; seeing Dietician   Orthostatic hypotension    PONV (postoperative nausea and vomiting)    "Patch Works"  Pulmonary hypertension (HCC) 04/2012   PA pressure   Sleep apnea    uses nightly   Vitamin D deficiency    per facility notes    Past Surgical History:  Procedure Laterality Date   ABDOMINAL AORTAGRAM N/A 11/15/2011   Procedure: ABDOMINAL Ronny Flurry;  Surgeon: Sherren Kerns, MD;  Location: [facility] CATH LAB;  Service: Cardiovascular;  [name];   ABDOMINAL AORTOGRAM W/LOWER EXTREMITY N/A 10/13/2019   Procedure: ABDOMINAL AORTOGRAM W/LOWER EXTREMITY;  Surgeon: Iran Ouch, MD;  Location: [facility] INVASIVE CV LAB;  Service: CV: Non-obst Ao-Iliac. R SFA-PopA patent with significant calcific stenosis of TP trunk-prox PTA (dominant) & CTO ATA  -> R PTA-TP PTCA   ABDOMINAL AORTOGRAM W/LOWER EXTREMITY N/A 11/06/2022   Procedure: ABDOMINAL AORTOGRAM W/LOWER EXTREMITY;  Surgeon: Iran Ouch, MD;  Location: [facility] INVASIVE CV LAB;  Service: Cardiovascular;  [name];   ABDOMINAL  AORTOGRAM W/LOWER EXTREMITY Right 04/01/2023   Procedure: ABDOMINAL AORTOGRAM W/LOWER EXTREMITY;  Surgeon: Nada Libman, MD;  [facility]: [facility] INVASIVE CV LAB;  Service: Cardiovascular;  Laterality: Right;   ABIs  04/27/2012   mild bilateral arterial insufficiency   APPLICATION OF WOUND VAC Right 03/31/2023   Procedure: APPLICATION OF WOUND VAC;  Surgeon: Roby Lofts, MD;  [name];  Service: Orthopedics;  Laterality: Right;   BACK SURGERY  2005 x1   X2-2010   BUNIONECTOMY Right 11/11/2013   Procedure: RIGHT FOOT SILVER BUNIONECTOMY;  Surgeon: Toni Arthurs, MD;  Location: [facility];  Service: Orthopedics;  Laterality: Right;   CARDIAC CATHETERIZATION  12/11/2001   significant 3V CAD, normal LV function   CARDIOVERSION N/A 03/13/2022   Procedure: CARDIOVERSION;  Surgeon: Little Ishikawa, MD;  Location: [facility] ENDOSCOPY;  Service: Cardiovascular;  [name];   CARDIOVERSION N/A 07/25/2022   Procedure: CARDIOVERSION;  Surgeon: Jake Bathe, MD;  [facility] INVAS[facility] CV LAB;  Service: Cardiovascular;  [name];   Carotid Doppler  04/27/2012   right internal carotid: Elevated velocities but no evidence of plaque. Left internal carotid 40-59%   CAROTID ENDARTERECTOMY  2005   Right; recent carotid Dopplers notes elevated velocities.    colonscopy     CORONARY ANGIOPLASTY  12/21/2001   PTCA of the distal and mid AV groove circ, unsuccessful PTCA of second OM total occlusion, unsuccessful PTCA of the apical LAD total occlusion   CORONARY ANGIOPLASTY WITH STENT PLACEMENT  04/29/2012   PCI to 3 RCA lesions, Promus Premiere 2.29mmx8mm distally, mid was 2.69mx28mm and proximally 2.75x88mm, EF 55-60%   CORONARY STENT PLACEMENT  04/28/2012   PCI to LAD (3x60mm Xience DES postdilated to 3.25) and circ prox and mid (2 overlappinmg 2.1mmx12mmXience DES postdilated to 2.64mm)   DOPPLER ECHOCARDIOGRAPHY  04/28/2012   poor quality study: EF estimated 60-65%; unable to  assess diastolic function (previously noted to have diastolic dysfunction); severely dilated left atrium and mild right atrium; dilated IVC consistent with increased central venous pressure.Marland Kitchen   HARDWARE REMOVAL Right 03/31/2023   Procedure: HARDWARE REMOVAL RIGHT ANKLE;  Surgeon: Roby Lofts, MD;  [name];  Service: Orthopedics;  Laterality: Right;   HARDWARE REMOVAL Right 01/28/2023   Procedure: REMOVAL OF HARDWARE RIGHT ANKLE;  Surgeon: [name], MD;  [name];  Service: Orthopedics;  Laterality: Right;   HERNIA REPAIR     I & D EXTREMITY Right 01/02/2023   Procedure: IRRIGATION AND DEBRIDEMENT RIGHT ANKLE;  Surgeon: [name], MD;  [name];  Service: Orthopedics;  Laterality: Right;   I & D EXTREMITY Right 04/02/2023   Procedure: DEBRIDEMENT RIGHT ANKLE;  Surgeon: Nadara Mustard, MD;  Location: [facility] OR;  Service: Orthopedics;  Laterality: Right;   IR FLUORO GUIDE CV LINE RIGHT  04/07/2023   IR PATIENT EVAL TECH 0-60 MINS  04/07/2023   IR RADIOLOGIST EVAL & MGMT  04/08/2023   IR US GUIDE VASC ACCESS RIGHT  04/07/2023   LEFT AND RIGHT HEART CATHETERIZATION WITH CORONARY ANGIOGRAM N/A 04/27/2012   Procedure: LEFT AND RIGHT HEART CATHETERIZATION WITH CORONARY ANGIOGRAM;  Surgeon: Marykay Lex, MD;  Location: [facility] CATH LAB;  Service: Cardiovascular;  [name];   LEFT AND RIGHT HEART CATHETERIZATION WITH CORONARY ANGIOGRAM N/A 05/14/2013   Procedure: LEFT AND RIGHT HEART CATHETERIZATION WITH CORONARY ANGIOGRAM;  Surgeon: Lennette Bihari, MD;  [name]; PCWP ;; [name], [name], Patent RCA ostial mid and distal stents - 70% distal runoff Dz   LUMBAR LAMINECTOMY/DECOMPRESSION MICRODISCECTOMY  01/07/2012   Procedure: LUMBAR LAMINECTOMY/DECOMPRESSION MICRODISCECTOMY 1 LEVEL;  Surgeon: Carmela Hurt, MD;  Location:  [facility] NEURO ORS;  Service: Neurosurgery;  Laterality: Bilateral;   Lumbar Three-Four Decompression   LUMBAR LAMINECTOMY/DECOMPRESSION MICRODISCECTOMY N/A 07/26/2020   Procedure: Lumbar two-three Laminectomy/Foraminotomy;  Surgeon: Coletta Memos, MD;  [name];  Service: Neurosurgery;  [name];   ORIF ANKLE FRACTURE Right 01/02/2023   Procedure: OPEN REDUCTION INTERNAL FIXATION (ORIF)RIGHT ANKLE FRACTURE;  Surgeon: [name], MD;  [name];  Service: Orthopedics;  Laterality: Right;   PERCUTANEOUS CORONARY STENT INTERVENTION (PCI-S) N/A 04/28/2012   Procedure: PERCUTANEOUS CORONARY STENT INTERVENTION (PCI-S);  Surgeon: Lennette Bihari, MD;  Location: [facility] CATH LAB;  Service: Cardiovascular;  [name];   PERCUTANEOUS CORONARY STENT INTERVENTION (PCI-S) N/A 04/29/2012   Procedure: PERCUTANEOUS CORONARY STENT INTERVENTION (PCI-S);  Surgeon: Marykay Lex, MD;  Location: [facility] CATH LAB;  Service: Cardiovascular;  [name];   PERIPHERAL VASCULAR ATHERECTOMY  11/06/2022   Procedure: PERIPHERAL VASCULAR ATHERECTOMY;  Surgeon: Iran Ouch, MD;  [facility] LAB;  Service: Cardiovascular;;   PERIPHERAL VASCULAR BALLOON ANGIOPLASTY Right 10/13/2019   Procedure: PERIPHERAL VASCULAR BALLOON ANGIOPLASTY;  Surgeon: Iran Ouch, MD;  [facility] LAB;  Service: Cardiovascular;  Laterality: Right;  PTCA of R TP Trunk into PTA (Drug-coated) => Follow-up ABIs 0.9 on the right and 0.99 on the left.   PERIPHERAL VASCULAR BALLOON ANGIOPLASTY Right 04/01/2023   Procedure: PERIPHERAL VASCULAR BALLOON ANGIOPLASTY;  Surgeon: Nada Libman, MD;  [facility] INVAS[facility] CV LAB;  Service: Cardiovascular;  Laterality: Right;   PERIPHERAL VASCULAR INTERVENTION Right 04/01/2023   Procedure: PERIPHERAL VASCULAR INTERVENTION;  Surgeon: Nada Libman, MD;  [facility] INV[facility] CV LAB;  Service: Cardiovascular;  Laterality: Right;   SPINE SURGERY     UMBILICAL  HERNIA REPAIR  2009   steel mesh insert    Family History: Family History  Adopted: Yes  Family history unknown: Yes    Social History  reports that he quit smoking about 20 years ago. His smoking use included cigarettes. He started smoking about 62 years ago. He has a 42 pack-year smoking history. He has never used smokeless tobacco. He reports that he does not drink alcohol and does not use drugs.  Allergies  Allergen Reactions   Doxycycline Hives and Rash   Septra [  Bactrim] Itching   Penicillins Rash    Allergy to All cillin drugs  Ancef given 9/24 with no obvious reaction   Metformin And Related Diarrhea and Nausea Only   Other Nausea And Vomiting    General Anesthesia - sometimes causes nausea and vomiting * OK to use scopolamine patch     Sulfamethoxazole-Trimethoprim Other (See Comments)   Wellbutrin [Bupropion]     Mood Changes    Medications   Current Facility-Administered Medications:    acetaminophen (TYLENOL) tablet 650 mg, 650 mg, Oral, Q6H PRN, 650 mg at 04/06/23 2106 **OR** acetaminophen (TYLENOL) suppository 650 mg, 650 mg, Rectal, Q6H PRN, Nadara Mustard, MD   allopurinol (ZYLOPRIM) tablet 100 mg, 100 mg, Oral, Daily, Nadara Mustard, MD, 100 mg at 04/19/23 4098   amiodarone (PACERONE) tablet 200 mg, 200 mg, Oral, BID, Nadara Mustard, MD, 200 mg at 04/19/23 0913   benzonatate (TESSALON) capsule 200 mg, 200 mg, Oral, TID PRN, Nadara Mustard, MD   calcium carbonate (TUMS - dosed in mg elemental calcium) chewable tablet 200 mg of elemental calcium, 1 tablet, Oral, TID PRN, Dolly Rias, MD, 200 mg of elemental calcium at 04/19/23 0030   carvedilol (COREG) tablet 3.125 mg, 3.125 mg, Oral, BID WC, Jerald Kief, MD, 3.125 mg at 04/19/23 0913   Chlorhexidine Gluconate Cloth 2 % PADS 6 each, 6 each, Topical, Q0600, Hughie Closs, MD, 6 each at 04/18/23 0841   clopidogrel (PLAVIX) tablet 75 mg, 75 mg, Oral, Q breakfast, Nadara Mustard, MD, 75 mg at 04/19/23 0913    docusate sodium (COLACE) capsule 100 mg, 100 mg, Oral, BID, Nadara Mustard, MD, 100 mg at 04/19/23 0913   DULoxetine (CYMBALTA) DR capsule 60 mg, 60 mg, Oral, Daily, Nadara Mustard, MD, 60 mg at 04/19/23 0913   ezetimibe (ZETIA) tablet 10 mg, 10 mg, Oral, Daily, Nadara Mustard, MD, 10 mg at 04/19/23 0913   gabapentin (NEURONTIN) capsule 400 mg, 400 mg, Oral, TID, Nadara Mustard, MD, 400 mg at 04/19/23 0913   guaiFENesin (MUCINEX) 12 hr tablet 600 mg, 600 mg, Oral, BID, Nadara Mustard, MD, 600 mg at 04/19/23 0913   heparin ADULT infusion 100 units/mL (25000 units/220mL), 1,700 Units/hr, Intravenous, Continuous, Jenita Seashore, RPH, Last Rate: 17 mL/hr at 04/19/23 0909, 1,700 Units/hr at 04/19/23 0909   hydrALAZINE (APRESOLINE) injection 10 mg, 10 mg, Intravenous, Q6H PRN, Hughie Closs, MD, 10 mg at 04/10/23 1191   HYDROmorphone (DILAUDID) injection 0.5 mg, 0.5 mg, Intravenous, Q4H PRN, Nadara Mustard, MD, 0.5 mg at 04/17/23 2121   insulin aspart (novoLOG) injection 0-9 Units, 0-9 Units, Subcutaneous, Q6H, Nadara Mustard, MD, 2 Units at 04/19/23 4782   insulin glargine (LANTUS) injection 40 Units, 40 Units, Subcutaneous, Daily, Jerald Kief, MD, 40 Units at 04/19/23 0911   levothyroxine (SYNTHROID) tablet 88 mcg, 88 mcg, Oral, QAC breakfast, Nadara Mustard, MD, 88 mcg at 04/19/23 0547   melatonin tablet 3 mg, 3 mg, Oral, QHS PRN, Nadara Mustard, MD, 3 mg at 04/16/23 2213   methocarbamol (ROBAXIN) tablet 500 mg, 500 mg, Oral, Q6H PRN, 500 mg at 04/16/23 2215 **OR** methocarbamol (ROBAXIN) injection 500 mg, 500 mg, Intravenous, Q6H PRN, Nadara Mustard, MD   metoCLOPramide (REGLAN) tablet 5-10 mg, 5-10 mg, Oral, Q8H PRN **OR** metoCLOPramide (REGLAN) injection 5-10 mg, 5-10 mg, Intravenous, Q8H PRN, Nadara Mustard, MD   naloxone [facility]) injection 0.4 mg, 0.4 mg, Intravenous, PRN, Nadara Mustard, MD  ondansetron (ZOFRAN) injection 4 mg, 4 mg, Intravenous, Q6H PRN, Nadara Mustard, MD, 4 mg at  04/17/23 6644   oxyCODONE (Oxy IR/ROXICODONE) immediate release tablet 5 mg, 5 mg, Oral, Q4H PRN, Nadara Mustard, MD, 5 mg at 04/17/23 0311   polyethylene glycol (MIRALAX / GLYCOLAX) packet 17 g, 17 g, Oral, Daily, Rai, Ripudeep K, MD, 17 g at 04/19/23 0913   sodium chloride flush (NS) 0.9 % injection 3 mL, 3 mL, Intravenous, Q12H, Nadara Mustard, MD, 3 mL at 04/19/23 0914   sodium chloride flush (NS) 0.9 % injection 3 mL, 3 mL, Intravenous, PRN, Nadara Mustard, MD, 3 mL at 04/05/23 2245   sodium chloride flush (NS) 0.9 % injection 3-10 mL, 3-10 mL, Intravenous, Q12H, Nadara Mustard, MD, 10 mL at 04/19/23 0914   sodium chloride flush (NS) 0.9 % injection 3-10 mL, 3-10 mL, Intravenous, PRN, Nadara Mustard, MD   tamsulosin [facility]) capsule 0.4 mg, 0.4 mg, Oral, Daily, Rai, Ripudeep K, MD, 0.4 mg at 04/19/23 0913  Vitals   Vitals:   04/19/23 0432 04/19/23 0735 04/19/23 0833 04/19/23 1301  BP: (!) 152/63 (!) 160/68 (!) 165/68 (!) 153/61  Pulse: 81 79 80 72  Resp: 20 (!) 21  20  Temp: 98 F (36.7 C) 99.8 F (37.7 C)  99.1 F (37.3 C)  TempSrc: Oral Oral  Oral  SpO2: 94% 96% 95% 96%  Weight: 130.7 kg     Height:        Body mass index is 38.02 kg/m.  Physical Exam   Constitutional: Chronically ill-appearing Psych: Flat, cooperative Eyes: No scleral injection.  HENT: No OP obstruction.  Head: Normocephalic.   Respiratory: Effort normal, non-labored breathing.  GI: Soft.  No distension. There is no tenderness.  Skin: WDI.   Neurologic Examination   Neuro: Mental Status: Patient is awake, alert, oriented to person, place, month, age  Able to name and repeat though slow at times;  Cranial Nerves: II: Visual Fields are full.  III,IV, VI: EOMI without ptosis or diploplia.  V: Facial sensation is symmetric to light touch VII: right facial droop VIII: Hearing is intact to voice Motor: BUE with good antigravity strength without drift, though he has some asterixis (negative  myoclonus) RLE in brace  Good antigravity strength from the knee down bilaterally, but cannot maintain hip flexion antigravity with either leg, symmetric Sensory: Sensation is symmetric to light touch and temperature in the arms and legs.  Cerebellar: FNF and HKS are intact bilaterally   Labs/Imaging/Neurodiagnostic studies    Basic Metabolic Panel: Recent Labs  Lab 04/14/23 0423 04/15/23 0357 04/16/23 0752 04/17/23 1005 04/18/23 0542 04/19/23 0531  NA 138 137 140 142 137 133*  K 3.7 3.7 3.5 4.5 3.4* 3.0*  CL 104 108 107 108 103 100  CO2 23 21* 24 23 19* 17*  GLUCOSE 154* 192* 143* 124* 147* 172*  BUN 36* 36* 36* 33* 36* 39*  CREATININE 2.98* 3.04* 3.20* 3.21* 3.38* 3.56*  CALCIUM 8.8* 8.4* 8.9 9.3 9.1 9.0  MG 2.1  --  2.0  --   --   --   PHOS  --   --   --  3.3 3.3 3.2    CBC: Recent Labs  Lab 04/15/23 1452 04/16/23 0752 04/17/23 1003 04/18/23 0542 04/19/23 0531  WBC 8.0 5.3 8.7 10.2 14.1*  NEUTROABS 6.0  --   --   --   --   HGB 9.2* 8.0* 8.0* 8.3* 8.1*  HCT 27.0* 24.0* 24.1* 24.3* 23.8*  MCV 86.3 88.6 88.3 88.4 87.8  PLT 168 121* 109* 117* 134*   Lipid Panel:  Lab Results  Component Value Date   LDLCALC 28 04/02/2023   HgbA1c:  Lab Results  Component Value Date   HGBA1C 7.0 (H) 01/02/2023    INR  Lab Results  Component Value Date   INR 1.3 (H) 03/28/2023   APTT  Lab Results  Component Value Date   APTT 67 (H) 04/19/2023   AED levels: No results found for: "PHENYTOIN", "ZONISAMIDE", "LAMOTRIGINE", "LEVETIRACETA"  CT Head without contrast(Personally reviewed): 1. No acute intracranial abnormality or significant interval change. 2. Stable atrophy and white matter disease. This likely reflects the sequela of chronic microvascular ischemia. 3. Aspects is 10.  MRI Brain(Personally reviewed): Pending  ASSESSMENT   Harold [name] is a 68 y.o. male paroxysmal atrial fibrillation chronically anticoagulated on Eliquis, type 2 diabetes  mellitus, essential hypertension, stage IV CKD with baseline creatinine 2.1-2.6, who is admitted to [facility] on 03/27/2023 with influenza A. Code stroke called 3/1 for right facial droop. On exam he does have negative myoclonus as well. Needs MRI brain wo to confirm safety of continuing Eliquis and continued toxic metabolic work up; worsening AKI certainly may be contributing to his symptoms.   RECOMMENDATIONS  - MRI Brain wo stat - Hold heparin until MRI is done - If MRI brain is negative, toxic/metabolic workup per primary team - Neurology will follow-up MRI brain and otherwise we we will sign off at this time.  Note will be addended below when MRI has been completed ______________________________________________________________________    Signed, Elmer Picker, NP [facility]  Attending Neurologist's note:  I personally saw this patient, gathering history, performing a full neurologic examination, reviewing relevant labs, personally reviewing relevant imaging including head CT, and formulated the assessment and plan, adding the note above for completeness and clarity to accurately reflect my thoughts  Brooke Dare MD-PhD [facility] [phone] Available 7 AM to 7 PM, outside these hours please contact Neurologist on call listed on AMION

## 2023-04-19 NOTE — Progress Notes (Signed)
 PHARMACY - ANTICOAGULATION CONSULT NOTE  Pharmacy Consult for heparin Indication: atrial fibrillation  Allergies  Allergen Reactions   Doxycycline Hives and Rash   Septra [Bactrim] Itching   Penicillins Rash    Allergy to All cillin drugs  Ancef given 9/24 with no obvious reaction   Metformin And Related Diarrhea and Nausea Only   Other Nausea And Vomiting    General Anesthesia - sometimes causes nausea and vomiting * OK to use scopolamine patch     Sulfamethoxazole-Trimethoprim Other (See Comments)   Wellbutrin [Bupropion]     Mood Changes    Patient Measurements: Height: 6\' 1"  (185.4 cm) Weight: 130.7 kg (288 lb 2.3 oz) IBW/kg (Calculated) : 79.9 Heparin Dosing Weight: 112.8 kg  Vital Signs: Temp: 99.8 F (37.7 C) (03/01 0735) Temp Source: Oral (03/01 0735) BP: 160/68 (03/01 0735) Pulse Rate: 79 (03/01 0735)  Labs: Recent Labs    04/17/23 1003 04/17/23 1005 04/18/23 0542 04/18/23 1835 04/19/23 0531  HGB 8.0*  --  8.3*  --  8.1*  HCT 24.1*  --  24.3*  --  23.8*  PLT 109*  --  117*  --  134*  APTT  --   --   --  82* 67*  HEPARINUNFRC  --   --   --   --  0.74*  CREATININE  --  3.21* 3.38*  --  3.56*  CKTOTAL  --   --  348  --   --     Estimated Creatinine Clearance: 27.8 mL/min (A) (by C-G formula based on SCr of 3.56 mg/dL (H)).   Medical History: Past Medical History:  Diagnosis Date   Anemia    Anginal pain (HCC) 04/2013   Cardiac cath showed patent stents with distal LAD, circumflex-OM and RCA disease in small vessels.   Anxiety    Atrial fibrillation (HCC) 12/2021   CAD S/P percutaneous coronary angioplasty 2003, 04/2012   status post PCI to LAD, circumflex-OM 2, RCA   Carotid artery occlusion    Chronic renal insufficiency, stage II (mild)    Chronic venous insufficiency    varicosities, no reflux; dopplers 04/14/12- valvular insufficiency in the R and L GSV   Complication of anesthesia    COPD, mild (HCC)    Depression    situaltional     Diabetes mellitus    Diabetic neuropathy (HCC) 08/24/2019   Dyslipidemia associated with type 2 diabetes mellitus (HCC)    GERD (gastroesophageal reflux disease)    History of cardioversion 12/2021   at [facility]   HTN (hypertension)    Hypothyroidism    Neuromuscular disorder (HCC)    neuropathy in feet   Neuropathy    notably improved following PCI with improved cardiac function   Obesity    Obesity, Class II, BMI 35.0-39.9, with comorbidity (see actual BMI)    BMI 39; wgt loss efforts in place; seeing Dietician   Orthostatic hypotension    PONV (postoperative nausea and vomiting)    "Patch Works"   Pulmonary hypertension (HCC) 04/2012   PA pressure   Sleep apnea    uses nightly   Vitamin D deficiency    per facility notes    Medications:  Scheduled:   allopurinol  100 mg Oral Daily   amiodarone  200 mg Oral BID   carvedilol  3.125 mg Oral BID WC   Chlorhexidine Gluconate Cloth  6 each Topical Q0600   clopidogrel  75 mg Oral Q breakfast   docusate sodium  100 mg Oral BID   DULoxetine  60 mg Oral Daily   ezetimibe  10 mg Oral Daily   gabapentin  400 mg Oral TID   guaiFENesin  600 mg Oral BID   insulin aspart  0-9 Units Subcutaneous Q6H   insulin glargine  40 Units Subcutaneous Daily   levothyroxine  88 mcg Oral QAC breakfast   polyethylene glycol  17 g Oral Daily   sodium chloride flush  3 mL Intravenous Q12H   sodium chloride flush  3-10 mL Intravenous Q12H   tamsulosin  0.4 mg Oral Daily   Infusions:   heparin 1,650 Units/hr (04/19/23 0411)    Assessment: 68 y/o male with history of afib on apixaban. Now having significant bleeding from CVL site. Pharmacy consulted for heparin.   Eliquis has been held d/t bleeding from CVL site. Per discussion with provider, would like to keep pt on anticoagulation due to afib but would like heparin therapy for now to monitor bleeding. Last eliquis dose was 2/20 PM.   aPTT is at low end of therapeutic range (67) on  1650 units/hr. Heparin level continues to not correlate with aPTT despite last dose of Eliquis being > 1 week ago, possibly due to continued AKI. No issues with infusion or s/sx of bleeding per RN. CBC is stable. Will slightly increase rate to keep aPTT therapeutic.  Goal of Therapy:  Heparin level 0.3-0.7 units/ml aPTT 66-102 seconds Monitor platelets by anticoagulation protocol: Yes   Plan:  Increase heparin infusion to 1700 unit/hr F/u daily aPTT, HL, CBC Continue to check both aPTT and HL until levels correlate while on heparin  Nicole Kindred, PharmD PGY1 Pharmacy Resident 04/19/2023 8:40 AM

## 2023-04-19 NOTE — Progress Notes (Signed)
 [facility] Progress Note    Assessment/ Plan:   AKI on CKD, nonoliguric -Followed at our office for CKD care, baseline Cr in the 2s typically. Suspecting his current AKI is related to episode decreased renal perfusion in the context of orthostatic hypotension. ATN evident on urine sediment -Cr stable up to 3.56.  Nonoliguric. Suspect rise in cr 2/2 n/v. Encouraged PO hydration for now, not keen on giving him fluids given recent SOB/pulm edema -renal u/s unremarkable. urine with proteinuria/glucosuria, granular casts, 0-5 rbcs+wbcs/hpf. Moderate blood on dipstick however. CK WNL.  GN serologies negative thus far. -given concern for AIN and with nephrotic proteinuria, we had originally planned for renal biopsy however this is on hold given his extremely high risk for bleeding.  At this point risks outweigh benefits.  Irrespective of this, his antibiotics are on hold currently after discussing with ID. -no indication for renal replacement therapy at this junction -Avoid nephrotoxic medications including NSAIDs and iodinated intravenous contrast exposure unless the latter is absolutely indicated.  Preferred narcotic agents for pain control are hydromorphone, fentanyl, and methadone. Morphine should not be used. Avoid Baclofen and avoid oral sodium phosphate and magnesium citrate based laxatives / bowel preps. Continue strict Input and Output monitoring. Will monitor the patient closely with you and intervene or adjust therapy as indicated by changes in clinical status/labs    Orthostatic hypotension H/o HTN -recommend compression stockings and abd binder for now -orthostats neg 2/26, repeat when able   Edema SOB, acute on chronic dCHF exacerbation -CXR 2/27 with possible pulm edema, images personally reviewed. Possible pna however procal wnl -albumin stopped, status post Lasix 60 mg IV 2/27. Dose PRN, no need for today  Nephrotic range proteinuria -UPC 9.6g -biopsy on hold  given high bleed risk -ANA, hep B surface antigen, HIV negative.  SPEP with M spike of 0.1 (immunofluorescence suspicious for monoclonal immunoglobulin versus benign), Free light chain ratio 1.87 (expected given his history of CKD). Can revisit biopsy down the road when in a more stable state -checking UACR (to see if there is any discrepancy as compared to [facility]) and UPEP--pending   RLE infection Hardware infection -s/p I&D, hardware removal 2/10, debridement 2/12 -on cefepime & dapto x 4 weeks originally.  On hold as of 2/28   Afib -on hep gtt currently   DM2 -per primary   Anemia due to chronic disease, thrombocytopenia -Transfuse for Hgb<7 g/dL. Hgb trended down. W/u per primary service esp since he is on a hep gtt   Acidosis -mild, likely related to AKI, monitor for now  Nausea/vomiting/constipation -Doubt this is secondary to uremia.  Likely related to constipation.  Improved. Per primary service.   Subjective:   Patient seen and examined bedside. RN at bedside. Apparently had right sided facial droop and rue weakness. Primary being informed now. He reports that his breathing is slightly better Still has some n/v but better especially after having 2 BM's yesterday.   Objective:   BP (!) 152/62   Pulse 71   Temp 99.1 F (37.3 C) (Oral)   Resp (!) 24   Ht 6\' 1"  (1.854 m)   Wt 130.7 kg   SpO2 98%   BMI 38.02 kg/m   Intake/Output Summary (Last 24 hours) at 04/19/2023 1335 Last data filed at 04/19/2023 0431 Gross per 24 hour  Intake 278.39 ml  Output 1600 ml  Net -1321.61 ml   Weight change: -0.5 kg  Physical Exam: Gen: NAD, in bed CVS: RRR Resp: diminished  breath sounds bibasilar, unlabored Abd: soft, obese, distended Ext: minimal/trace LE edema (unchanged) Neuro: awake, alert  Imaging: CT FEMUR LEFT WO CONTRAST Result Date: 04/17/2023 CLINICAL DATA:  Left lower thigh pain with firmness on palpation, evaluate for hematoma EXAM: CT OF THE LOWER LEFT EXTREMITY  WITHOUT CONTRAST TECHNIQUE: Multidetector CT imaging of the lower left extremity was performed according to the standard protocol. RADIATION DOSE REDUCTION: This exam was performed according to the departmental dose-optimization program which includes automated exposure control, adjustment of the mA and/or kV according to patient size and/or use of iterative reconstruction technique. COMPARISON:  None Available. FINDINGS: Bones/Joint/Cartilage Degenerative changes of left hip joint are seen. No acute fracture or dislocation is noted. Mild degenerative changes about the knee joint noted. No knee joint effusion is seen. Ligaments Suboptimally assessed by CT. Muscles and Tendons Surrounding musculature shows no focal abnormality. Soft tissues Mild soft tissue edema is noted in the mid and distal thigh. No focal soft tissue mass lesion is noted. IMPRESSION: No acute abnormality to correspond with the physical exam. Mild subcutaneous edema is noted in the mid to distal thigh. Electronically Signed   By: Alcide Clever M.D.   On: 04/17/2023 23:29    Labs: BMET Recent Labs  Lab 04/13/23 0815 04/14/23 0423 04/15/23 0357 04/16/23 0752 04/17/23 1005 04/18/23 0542 04/19/23 0531  NA 137 138 137 140 142 137 133*  K 3.8 3.7 3.7 3.5 4.5 3.4* 3.0*  CL 106 104 108 107 108 103 100  CO2 24 23 21* 24 23 19* 17*  GLUCOSE 170* 154* 192* 143* 124* 147* 172*  BUN 35* 36* 36* 36* 33* 36* 39*  CREATININE 2.85* 2.98* 3.04* 3.20* 3.21* 3.38* 3.56*  CALCIUM 8.7* 8.8* 8.4* 8.9 9.3 9.1 9.0  PHOS  --   --   --   --  3.3 3.3 3.2   CBC Recent Labs  Lab 04/15/23 1452 04/16/23 0752 04/17/23 1003 04/18/23 0542 04/19/23 0531  WBC 8.0 5.3 8.7 10.2 14.1*  NEUTROABS 6.0  --   --   --   --   HGB 9.2* 8.0* 8.0* 8.3* 8.1*  HCT 27.0* 24.0* 24.1* 24.3* 23.8*  MCV 86.3 88.6 88.3 88.4 87.8  PLT 168 121* 109* 117* 134*    Medications:     allopurinol  100 mg Oral Daily   amiodarone  200 mg Oral BID   carvedilol  3.125 mg  Oral BID WC   Chlorhexidine Gluconate Cloth  6 each Topical Q0600   clopidogrel  75 mg Oral Q breakfast   docusate sodium  100 mg Oral BID   DULoxetine  60 mg Oral Daily   ezetimibe  10 mg Oral Daily   gabapentin  400 mg Oral TID   guaiFENesin  600 mg Oral BID   insulin aspart  0-9 Units Subcutaneous Q6H   insulin glargine  40 Units Subcutaneous Daily   levothyroxine  88 mcg Oral QAC breakfast   polyethylene glycol  17 g Oral Daily   sodium chloride flush  3 mL Intravenous Q12H   sodium chloride flush  3-10 mL Intravenous Q12H   tamsulosin  0.4 mg Oral Daily      Anthony Sar, MD [facility] 04/19/2023, 1:35 PM

## 2023-04-20 DIAGNOSIS — L97314 Non-pressure chronic ulcer of right ankle with necrosis of bone: Secondary | ICD-10-CM | POA: Diagnosis not present

## 2023-04-20 DIAGNOSIS — L02415 Cutaneous abscess of right lower limb: Secondary | ICD-10-CM | POA: Diagnosis not present

## 2023-04-20 DIAGNOSIS — N179 Acute kidney failure, unspecified: Secondary | ICD-10-CM | POA: Diagnosis not present

## 2023-04-20 DIAGNOSIS — J101 Influenza due to other identified influenza virus with other respiratory manifestations: Secondary | ICD-10-CM | POA: Diagnosis not present

## 2023-04-20 LAB — RENAL FUNCTION PANEL
Albumin: 2.7 g/dL — ABNORMAL LOW (ref 3.5–5.0)
Anion gap: 19 — ABNORMAL HIGH (ref 5–15)
BUN: 48 mg/dL — ABNORMAL HIGH (ref 8–23)
CO2: 17 mmol/L — ABNORMAL LOW (ref 22–32)
Calcium: 9.2 mg/dL (ref 8.9–10.3)
Chloride: 98 mmol/L (ref 98–111)
Creatinine, Ser: 4.62 mg/dL — ABNORMAL HIGH (ref 0.61–1.24)
GFR, Estimated: 13 mL/min — ABNORMAL LOW (ref >60)
Glucose, Bld: 214 mg/dL — ABNORMAL HIGH (ref 70–99)
Phosphorus: 3.8 mg/dL (ref 2.5–4.6)
Potassium: 3.5 mmol/L (ref 3.5–5.1)
Sodium: 134 mmol/L — ABNORMAL LOW (ref 135–145)

## 2023-04-20 LAB — CBC
HCT: 22 % — ABNORMAL LOW (ref 39.0–52.0)
Hemoglobin: 7.4 g/dL — ABNORMAL LOW (ref 13.0–17.0)
MCH: 29.6 pg (ref 26.0–34.0)
MCHC: 33.6 g/dL (ref 30.0–36.0)
MCV: 88 fL (ref 80.0–100.0)
Platelets: 143 10*3/uL — ABNORMAL LOW (ref 150–400)
RBC: 2.5 MIL/uL — ABNORMAL LOW (ref 4.22–5.81)
RDW: 16.9 % — ABNORMAL HIGH (ref 11.5–15.5)
WBC: 12.1 10*3/uL — ABNORMAL HIGH (ref 4.0–10.5)
nRBC: 0 % (ref 0.0–0.2)

## 2023-04-20 LAB — APTT: aPTT: 81 s — ABNORMAL HIGH (ref 24–36)

## 2023-04-20 LAB — GLUCOSE, CAPILLARY
Glucose-Capillary: 194 mg/dL — ABNORMAL HIGH (ref 70–99)
Glucose-Capillary: 196 mg/dL — ABNORMAL HIGH (ref 70–99)
Glucose-Capillary: 214 mg/dL — ABNORMAL HIGH (ref 70–99)
Glucose-Capillary: 222 mg/dL — ABNORMAL HIGH (ref 70–99)

## 2023-04-20 LAB — PREPARE RBC (CROSSMATCH)

## 2023-04-20 LAB — HEPARIN LEVEL (UNFRACTIONATED): Heparin Unfractionated: 0.77 [IU]/mL — ABNORMAL HIGH (ref 0.30–0.70)

## 2023-04-20 MED ORDER — SODIUM CHLORIDE 0.9% IV SOLUTION: Freq: Once | INTRAVENOUS | Status: AC

## 2023-04-20 NOTE — Progress Notes (Addendum)
 PHARMACY - ANTICOAGULATION CONSULT NOTE  Pharmacy Consult for heparin Indication: atrial fibrillation  Allergies  Allergen Reactions   Doxycycline Hives and Rash   Septra [Bactrim] Itching   Penicillins Rash    Allergy to All cillin drugs  Ancef given 9/24 with no obvious reaction   Metformin And Related Diarrhea and Nausea Only   Other Nausea And Vomiting    General Anesthesia - sometimes causes nausea and vomiting * OK to use scopolamine patch     Sulfamethoxazole-Trimethoprim Other (See Comments)   Wellbutrin [Bupropion]     Mood Changes    Patient Measurements: Height: 6\' 1"  (185.4 cm) Weight: 133.9 kg (295 lb 3.1 oz) IBW/kg (Calculated) : 79.9 Heparin Dosing Weight: 112.8 kg  Vital Signs: Temp: 98.5 F (36.9 C) (03/02 0750) Temp Source: Oral (03/02 0750) BP: 145/55 (03/02 0750) Pulse Rate: 71 (03/02 0528)  Labs: Recent Labs    04/18/23 0542 04/18/23 1835 04/19/23 0531 04/20/23 0619  HGB 8.3*  --  8.1* 7.4*  HCT 24.3*  --  23.8* 22.0*  PLT 117*  --  134* 143*  APTT  --  82* 67* 81*  HEPARINUNFRC  --   --  0.74* 0.77*  CREATININE 3.38*  --  3.56* 4.62*  CKTOTAL 348  --   --   --     Estimated Creatinine Clearance: 21.7 mL/min (A) (by C-G formula based on SCr of 4.62 mg/dL (H)).   Medical History: Past Medical History:  Diagnosis Date   Anemia    Anginal pain (HCC) 04/2013   Cardiac cath showed patent stents with distal LAD, circumflex-OM and RCA disease in small vessels.   Anxiety    Atrial fibrillation (HCC) 12/2021   CAD S/P percutaneous coronary angioplasty 2003, 04/2012   status post PCI to LAD, circumflex-OM 2, RCA   Carotid artery occlusion    Chronic renal insufficiency, stage II (mild)    Chronic venous insufficiency    varicosities, no reflux; dopplers 04/14/12- valvular insufficiency in the R and L GSV   Complication of anesthesia    COPD, mild (HCC)    Depression    situaltional    Diabetes mellitus    Diabetic neuropathy (HCC)  08/24/2019   Dyslipidemia associated with type 2 diabetes mellitus (HCC)    GERD (gastroesophageal reflux disease)    History of cardioversion 12/2021   at [facility]   HTN (hypertension)    Hypothyroidism    Neuromuscular disorder (HCC)    neuropathy in feet   Neuropathy    notably improved following PCI with improved cardiac function   Obesity    Obesity, Class II, BMI 35.0-39.9, with comorbidity (see actual BMI)    BMI 39; wgt loss efforts in place; seeing Dietician   Orthostatic hypotension    PONV (postoperative nausea and vomiting)    "[employer]"   Pulmonary hypertension (HCC) 04/2012   PA pressure   Sleep apnea    uses nightly   Vitamin D deficiency    per facility notes    Medications:  Scheduled:   allopurinol  100 mg Oral Daily   amiodarone  200 mg Oral BID   carvedilol  3.125 mg Oral BID WC   Chlorhexidine Gluconate Cloth  6 each Topical Q0600   clopidogrel  75 mg Oral Q breakfast   docusate sodium  100 mg Oral BID   DULoxetine  60 mg Oral Daily   ezetimibe  10 mg Oral Daily   gabapentin  300 mg Oral  BID   guaiFENesin  600 mg Oral BID   insulin aspart  0-9 Units Subcutaneous Q6H   insulin glargine  40 Units Subcutaneous Daily   levothyroxine  88 mcg Oral QAC breakfast   polyethylene glycol  17 g Oral Daily   sodium chloride flush  3 mL Intravenous Q12H   sodium chloride flush  3-10 mL Intravenous Q12H   tamsulosin  0.4 mg Oral Daily   Infusions:   heparin 1,700 Units/hr (04/19/23 2120)    Assessment: 68 y/o male with history of afib on apixaban. Eliquis has been held d/t bleeding from CVL site. Per discussion with provider, would like to keep pt on anticoagulation due to afib but would like heparin therapy for now to monitor bleeding. Last eliquis dose was 2/20 PM.   Code stroke activated yesterday afternoon (3/1) for R facial droop and slurred speech. Heparin was held for MRI per neurology around ~1330. MRI was negative, therefore heparin was  resumed at prior rate ~1900.  aPTT with AM labs is therapeutic (81) on 1700 units/hr. Heparin level (0.77) continues to not correlate with aPTT despite last dose of Eliquis being 10 days ago, likely due to continued and worsening AKI (Scr 2.62 on day Eliquis was stopped, up to 4.62 today). No issues with infusion or s/sx of bleeding per RN. Slight dip in Hgb from 8.1 to 7.4.  Goal of Therapy:  Heparin level 0.3-0.7 units/ml aPTT 66-102 seconds Monitor platelets by anticoagulation protocol: Yes   Plan:  Continue heparin infusion at 1700 unit/hr F/u daily aPTT, HL, CBC Continue to check both aPTT and HL until levels correlate while on heparin  Nicole Kindred, PharmD PGY1 Pharmacy Resident 04/20/2023 8:08 AM  ADDENDUM: Patient to receive 1 unit pRBCs for Hgb. Not actively bleeding. Will hold heparin gtt for 24 hours per MD.  Nicole Kindred, PharmD PGY1 Pharmacy Resident 04/20/2023 12:43 PM

## 2023-04-20 NOTE — Progress Notes (Signed)
   04/20/23 2200  BiPAP/CPAP/SIPAP  Reason BIPAP/CPAP not in use Non-compliant (pt refused)

## 2023-04-20 NOTE — Plan of Care (Signed)
  Problem: Pain Managment: Goal: General experience of comfort will improve and/or be controlled Outcome: Progressing   Problem: Safety: Goal: Ability to remain free from injury will improve Outcome: Progressing   Problem: Skin Integrity: Goal: Risk for impaired skin integrity will decrease Outcome: Progressing

## 2023-04-20 NOTE — Progress Notes (Signed)
 [facility] Progress Note    Assessment/ Plan:   AKI on CKD, nonoliguric -Followed at our office for CKD care, baseline Cr in the 2s typically. Suspecting his current AKI is related to episode decreased renal perfusion in the context of orthostatic hypotension. ATN evident on urine sediment -Cr stable up to 4.6. Encouraged PO hydration for now, not keen on giving him fluids given recent SOB/pulm edema -renal u/s unremarkable. urine with proteinuria/glucosuria, granular casts, 0-5 rbcs+wbcs/hpf. Moderate blood on dipstick however. CK WNL.  GN serologies negative thus far. -given concern for AIN and with nephrotic proteinuria, we had originally planned for renal biopsy however this is on hold given his extremely high risk for bleeding.  At this point risks outweigh benefits.  Irrespective of this, his antibiotics are on hold currently after discussing with ID. -no indication for urgent renal replacement therapy as of today. However, I can going to keep him NPO after midnight for possible HD catheter placement along with plans to start HD if parameters do not improve. Discussed this at length with patient and he is in agreement -Avoid nephrotoxic medications including NSAIDs and iodinated intravenous contrast exposure unless the latter is absolutely indicated.  Preferred narcotic agents for pain control are hydromorphone, fentanyl, and methadone. Morphine should not be used. Avoid Baclofen and avoid oral sodium phosphate and magnesium citrate based laxatives / bowel preps. Continue strict Input and Output monitoring. Will monitor the patient closely with you and intervene or adjust therapy as indicated by changes in clinical status/labs    Orthostatic hypotension H/o HTN -recommend compression stockings and abd binder for now -orthostats neg 2/26, repeat yesterday still positive    Edema SOB, acute on chronic dCHF exacerbation -CXR 2/27 with possible pulm edema, images personally  reviewed. Possible pna however procal wnl -albumin stopped, status post Lasix 60 mg IV 2/27. Dose PRN, no need for today  Nephrotic range proteinuria -UPC 9.6g -biopsy on hold given high bleed risk -ANA, hep B surface antigen, HIV negative.  SPEP with M spike of 0.1 (immunofluorescence suspicious for monoclonal immunoglobulin versus benign), Free light chain ratio 1.87 (expected given his history of CKD). Can revisit biopsy down the road when in a more stable state -checking discrepancy between UPC and UACR--uacr ordered - UPEP--pending   RLE infection Hardware infection -s/p I&D, hardware removal 2/10, debridement 2/12 -on cefepime & dapto x 4 weeks originally.  On hold as of 2/28   Afib -on hep gtt   DM2 -per primary   Anemia due to chronic disease, thrombocytopenia -Transfuse for Hgb<7 g/dL. Hgb trended down, now 7.4. W/u recommended-- per primary service esp since he is on a hep gtt   Acidosis -mild, likely related to AKI, monitor for now, start sodium bicarb if/when co2 <16  Nausea/vomiting/constipation -Doubt this is secondary to uremia.  Likely related to constipation.  Improved. Per primary service.   Subjective:   Patient seen and examined bedside. He reports that his breathing is improving everyday. No longer having any N/V, still urinating.   Objective:   BP (!) 144/54 (BP Location: Left Arm)   Pulse 71   Temp 98.1 F (36.7 C) (Oral)   Resp 19   Ht 6\' 1"  (1.854 m)   Wt 133.9 kg   SpO2 96%   BMI 38.95 kg/m   Intake/Output Summary (Last 24 hours) at 04/20/2023 1422 Last data filed at 04/20/2023 0500 Gross per 24 hour  Intake --  Output 400 ml  Net -400 ml  Weight change: 3.2 kg  Physical Exam: Gen: NAD, in bed CVS: RRR Resp: diminished breath sounds bibasilar, unlabored, no w/r/r/c Abd: soft, obese Ext: minimal/trace LE edema (unchanged) Neuro: awake, alert  Imaging: MR BRAIN WO CONTRAST Result Date: 04/19/2023 CLINICAL DATA:  Right facial droop  and slurred speech. EXAM: MRI HEAD WITHOUT CONTRAST TECHNIQUE: Multiplanar, multiecho pulse sequences of the brain and surrounding structures were obtained without intravenous contrast. COMPARISON:  CT head without contrast 04/19/2023. FINDINGS: Brain: No acute infarct, hemorrhage, or mass lesion is present. Moderate generalized atrophy is present. Mild white matter changes are noted bilaterally. The ventricles are proportionate to the degree of atrophy. Deep brain nuclei are within normal limits. No significant extraaxial fluid collection is present. The brainstem and cerebellum are within normal limits. The internal auditory canals are within normal limits. Midline structures are within normal limits. Vascular: Flow is present in the major intracranial arteries. Skull and upper cervical spine: The craniocervical junction is normal. Upper cervical spine is within normal limits. Marrow signal is unremarkable. Sinuses/Orbits: The paranasal sinuses and mastoid air cells are clear. Bilateral lens replacements are noted. Globes and orbits are otherwise unremarkable. IMPRESSION: 1. No acute intracranial abnormality or significant interval change. 2. Moderate generalized atrophy and white matter disease likely reflects the sequela of chronic microvascular ischemia. Electronically Signed   By: Marin Roberts M.D.   On: 04/19/2023 19:06   CT HEAD CODE STROKE WO CONTRAST Result Date: 04/19/2023 CLINICAL DATA:  Code stroke. Acute onset of right upper extremity weakness and facial droop. Abnormal speech. EXAM: CT HEAD WITHOUT CONTRAST TECHNIQUE: Contiguous axial images were obtained from the base of the skull through the vertex without intravenous contrast. RADIATION DOSE REDUCTION: This exam was performed according to the departmental dose-optimization program which includes automated exposure control, adjustment of the mA and/or kV according to patient size and/or use of iterative reconstruction technique.  COMPARISON:  CT head without contrast 01/09/2023 FINDINGS: Brain: Mild atrophy and white matter changes are similar the prior exam. No acute infarct, hemorrhage, or mass lesion is present. The ventricles are of normal size. No significant extraaxial fluid collection is present. Midline structures are within normal limits. The brainstem and cerebellum are within normal limits. Vascular: Atherosclerotic calcifications are present within the cavernous internal carotid arteries and at the dural margins of both vertebral arteries bilaterally. Skull: Calvarium is intact. No focal lytic or blastic lesions are present. No significant extracranial soft tissue lesion is present. Sinuses/Orbits: Small polyps or mucous retention cysts are present along the inferior maxillary sinuses bilaterally. No fluid levels are present. The paranasal sinuses and mastoid air cells are otherwise clear. ASPECTS [facility] Stroke Program Early CT Score) - Ganglionic level infarction (caudate, lentiform nuclei, internal capsule, insula, M1-M3 cortex): 7/7 - Supraganglionic infarction (M4-M6 cortex): 3/3 Total score (0-10 with 10 being normal): /10 IMPRESSION: 1. No acute intracranial abnormality or significant interval change. 2. Stable atrophy and white matter disease. This likely reflects the sequela of chronic microvascular ischemia. 3. Aspects is 10. The above was relayed via text pager to Dr. Brooke Dare on 04/19/2023 at 13:39 . Electronically Signed   By: Marin Roberts M.D.   On: 04/19/2023 13:39    Labs: BMET Recent Labs  Lab 04/14/23 0423 04/15/23 0357 04/16/23 0752 04/17/23 1005 04/18/23 0542 04/19/23 0531 04/20/23 0619  NA 138 137 140 142 137 133* 134*  K 3.7 3.7 3.5 4.5 3.4* 3.0* 3.5  CL 104 108 107 108 103 100 98  CO2 23 21* 24  23 19* 17* 17*  GLUCOSE 154* 192* 143* 124* 147* 172* 214*  BUN 36* 36* 36* 33* 36* 39* 48*  CREATININE 2.98* 3.04* 3.20* 3.21* 3.38* 3.56* 4.62*  CALCIUM 8.8* 8.4* 8.9 9.3 9.1 9.0  9.2  PHOS  --   --   --  3.3 3.3 3.2 3.8   CBC Recent Labs  Lab 04/15/23 1452 04/16/23 0752 04/17/23 1003 04/18/23 0542 04/19/23 0531 04/20/23 0619  WBC 8.0   < > 8.7 10.2 14.1* 12.1*  NEUTROABS 6.0  --   --   --   --   --   HGB 9.2*   < > 8.0* 8.3* 8.1* 7.4*  HCT 27.0*   < > 24.1* 24.3* 23.8* 22.0*  MCV 86.3   < > 88.3 88.4 87.8 88.0  PLT 168   < > 109* 117* 134* 143*   < > = values in this interval not displayed.    Medications:     sodium chloride   Intravenous Once   allopurinol  100 mg Oral Daily   amiodarone  200 mg Oral BID   carvedilol  3.125 mg Oral BID WC   Chlorhexidine Gluconate Cloth  6 each Topical Q0600   clopidogrel  75 mg Oral Q breakfast   docusate sodium  100 mg Oral BID   DULoxetine  60 mg Oral Daily   ezetimibe  10 mg Oral Daily   gabapentin  300 mg Oral BID   guaiFENesin  600 mg Oral BID   insulin aspart  0-9 Units Subcutaneous Q6H   insulin glargine  40 Units Subcutaneous Daily   levothyroxine  88 mcg Oral QAC breakfast   polyethylene glycol  17 g Oral Daily   sodium chloride flush  3 mL Intravenous Q12H   sodium chloride flush  3-10 mL Intravenous Q12H   tamsulosin  0.4 mg Oral Daily      Anthony Sar, MD [facility] 04/20/2023, 2:22 PM

## 2023-04-20 NOTE — Progress Notes (Signed)
 [facility]                                                                              Harold [name], is a 68 y.o. male, DOB - June 13, 1955, NGE:[id number] Admit date - 03/27/2023    Outpatient Primary MD for the patient is Harold Northern, MD  LOS - 24  days  Chief Complaint  Patient presents with   Cough       Brief summary   Patient is a 68 year old male with morbid obesity, OSA, DM2, HTN, HLD, CAD/stent, A-fib on Eliquis, carotid artery stenosis, CKD, chronic anemia, anxiety/depression, diabetic neuropathy, GERD, hypothyroidism, COPD, pulmonary hypertension. 2/6, patient presented to the ED with complaint of cough, subjective fever, myalgia, generalized weakness for 5 days. Respiratory virus panel positive for influenza A. Chest x-ray negative for infiltrates. Also noted to have right leg cellulitis.  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 -S/p right ankle hardware removal, I&D and wound VAC placement on 2/10 by Dr. Jena Gauss -S/p repeat debridement of the right ankle by Dr. Lajoyce Corners 04/02/2023.   -ID following, changed to cefepime to ertapenem, continue daptomycin however given the concern for possible AIN from antibiotics, causing AKI, ID considering holding the antibiotics -Dr. Lajoyce Corners to evaluate the wound -PT recommended SNF however patient will have to pay out-of-pocket and unable to afford. -Off antibiotics    AKI on CKD stage IIIb:  Nephrotic range proteinuria   -Baseline creatinine appears to be 1.9-2.2.   -Creatinine peaked to 3.1 on 2/19 that started to trend up after increasing lasix dose to 80mg  -Lasix placed on hold however creatinine continued to trend up, nephrology was consulted -Has received IV albumin, then noted to be congested, elevated BNP 765.1.  Chest x-ray showed bilateral  perihilar and basilar opacities, edema versus pneumonia. -Creatinine continues to trend up initial plan for renal biopsy, on hold as patient is on plavix which will need to be held if for 5 days and high risk for bleeding.  -Nephrology y following  Right facial droop -On 3/1 patient noted to have right facial droop, evaluated by neurology, MRI of the brain negative for acute stroke. -Currently on Plavix.  PAD -Seen by vascular surgery underwent aortogram, right leg angiogram, successful stenting of right superficial femoral artery on 04/01/2023.   -on Plavix and heparin gtt  -On 12/27, patient complained of left thigh pain with area of tenderness noted above knee.  -CT of the left femur negative for hematoma.   Depression ?Acute metabolic encephalopathy  - Continue Cymbalta - hand tremors in the last 3 days, ?cefepime induced encephalopathy. Cognitive dysfunction ongoing for months, worsening per patient's partner  -Cefepime now discontinued   Acute diarrhea Resolved   Influenza A:  - Presented with 5 days of cough, fever, myalgia.  - Respiratory virus panel positive for influenza A. Chest x-ray without acute infiltrate -Improved with symptomatic treatment, was outside 48-hour window for Tamiflu hence was not initiated.    Type 2 diabetes mellitus / Diabetic neuropathy, POA: A1c 7  on 01/02/2023. PTA meds-Tresiba 60 units daily, Mounjaro  CBG (last 3)  Recent Labs    04/20/23 0013 04/20/23 0537 04/20/23 1140  GLUCAP 222* 196* 214*   -Continue Semglee, SSI   Diabetic neuropathy -PTA reportedly on gabapentin 1600 mg 3 times daily. Since dose was unable to be confirmed, pt was continued on 400 mg 3 times daily instead   Hypokalemia/hypomagnesemia: Improved   Paroxysmal A-fib Rates controlled on PTA meds Coreg, amiodarone  -CHADS-VASc of 4 -Eliquis held secondary to bleeding from CVL recently -Continue heparin drip   Essential hypertension -PTA meds- Coreg 3.125 mg  twice daily, Lasix 160 mg daily -Currently on Coreg, amlodipine. -  Lasix held due to AKI   Anemia of chronic disease: Baseline hemoglobin between 10.5 11.5.   -Hgb remains stable. Bleeding recently noted at CVL site, eliquis held for now -No active bleeding noted hemoglobin 7.4 today -Transfuse 1 unit packed RBCs   CAD/stent Carotid artery stenosis HLD Continue PTA meds -Plavix, Crestor -Eliquis held for now, continue heparin gtt and monitor for any bleeding issues  Hypothyroidism Continue Synthroid   Obstructive sleep apnea Patient reported good compliance on home nocturnal CPAP   Gout Continue allopurinol, colchicine    Orthostatic hypotension -Received brief IV fluids, continue abdominal binder -2D echo showed EF 55 to 60%, basal inferior hypokinesis, G2 DD    Moderate obesity, class II Estimated body mass index is 38.95 kg/m as calculated from the following:   Height as of this encounter: 6\' 1"  (1.854 m).   Weight as of this encounter: 133.9 kg.  Code Status: Full code DVT Prophylaxis:  Place and maintain sequential compression device Start: 04/15/23 1549 SCDs Start: 04/02/23 1617 SCDs Start: 03/31/23 1620 SCDs Start: 03/27/23 2243   Level of Care: Level of care: Progressive Cardiac Family Communication:  Disposition Plan:      Remains inpatient appropriate:      Procedures:  2D echo  Consultants:   Renal Ortho ID   Antimicrobials:   Anti-infectives (From admission, onward)    Start     Dose/Rate Route Frequency Ordered Stop   04/19/23 1000  ertapenem (INVANZ) 500 mg in sodium chloride 0.9 % 50 mL IVPB  Status:  Discontinued        500 mg 100 mL/hr over 30 Minutes Intravenous Daily 04/18/23 0931 04/18/23 1106   04/17/23 1800  ertapenem (INVANZ) 1 g in sodium chloride 0.9 % 100 mL IVPB  Status:  Discontinued        1 g 200 mL/hr over 30 Minutes Intravenous Daily 04/17/23 1354 04/18/23 0931   04/08/23 0000  daptomycin (CUBICIN) IVPB  Status:   Discontinued        700 mg Intravenous Every 24 hours 04/08/23 1147 04/08/23    04/08/23 0000  ceFEPime (MAXIPIME) IVPB  Status:  Discontinued        2 g Intravenous Every 12 hours 04/08/23 1147 04/18/23    04/08/23 0000  daptomycin (CUBICIN) IVPB  Status:  Discontinued        700 mg Intravenous Every 24 hours 04/08/23 1150 04/18/23    04/04/23 1400  DAPTOmycin (CUBICIN) IVPB 700 mg/144mL premix  Status:  Discontinued        700 mg 200 mL/hr over 30 Minutes Intravenous Daily 04/03/23 1435 04/18/23 1106   04/02/23 1715  ceFAZolin (ANCEF) IVPB 2g/100 mL premix  Status:  Discontinued        2 g 200 mL/hr over 30 Minutes Intravenous Every 8 hours 04/02/23 1616  04/02/23 1623   04/02/23 0600  ceFAZolin (ANCEF) IVPB 3g/150 mL premix  Status:  Discontinued        3 g 300 mL/hr over 30 Minutes Intravenous On call to O.R. 04/01/23 1700 04/02/23 1549   03/31/23 1015  ceFAZolin (ANCEF) IVPB 2g/100 mL premix        2 g 200 mL/hr over 30 Minutes Intravenous On call to O.R. 03/31/23 0921 03/31/23 1203   03/29/23 2200  vancomycin (VANCOREADY) IVPB 2000 mg/400 mL  Status:  Discontinued       Placed in "Followed by" Linked Group   2,000 mg 200 mL/hr over 120 Minutes Intravenous Every 48 hours 03/27/23 2257 04/03/23 1435   03/28/23 1045  metroNIDAZOLE (FLAGYL) tablet 500 mg  Status:  Discontinued        500 mg Oral Every 12 hours 03/28/23 0955 04/03/23 0945   03/27/23 2330  vancomycin (VANCOCIN) 2,500 mg in sodium chloride 0.9 % 500 mL IVPB       Placed in "Followed by" Linked Group   2,500 mg 262.5 mL/hr over 120 Minutes Intravenous  Once 03/27/23 2257 03/28/23 0241   03/27/23 2300  metroNIDAZOLE (FLAGYL) IVPB 500 mg  Status:  Discontinued        500 mg 100 mL/hr over 60 Minutes Intravenous Every 12 hours 03/27/23 2247 03/28/23 0955   03/27/23 2300  ceFEPIme (MAXIPIME) 2 g in sodium chloride 0.9 % 100 mL IVPB  Status:  Discontinued        2 g 200 mL/hr over 30 Minutes Intravenous Every 12 hours  03/27/23 2257 04/17/23 1354   03/27/23 2100  ceFAZolin (ANCEF) IVPB 2g/100 mL premix        2 g 200 mL/hr over 30 Minutes Intravenous  Once 03/27/23 2058 03/27/23 2219          Medications  allopurinol  100 mg Oral Daily   amiodarone  200 mg Oral BID   carvedilol  3.125 mg Oral BID WC   Chlorhexidine Gluconate Cloth  6 each Topical Q0600   clopidogrel  75 mg Oral Q breakfast   docusate sodium  100 mg Oral BID   DULoxetine  60 mg Oral Daily   ezetimibe  10 mg Oral Daily   gabapentin  300 mg Oral BID   guaiFENesin  600 mg Oral BID   insulin aspart  0-9 Units Subcutaneous Q6H   insulin glargine  40 Units Subcutaneous Daily   levothyroxine  88 mcg Oral QAC breakfast   polyethylene glycol  17 g Oral Daily   sodium chloride flush  3 mL Intravenous Q12H   sodium chloride flush  3-10 mL Intravenous Q12H   tamsulosin  0.4 mg Oral Daily      Subjective:   Charli Latino was seen and examined today.  No acute complaints, right facial droop has resolved.  No chest pain or shortness of breath, fevers.   Objective:   Vitals:   04/19/23 2023 04/20/23 0528 04/20/23 0750 04/20/23 1141  BP: (!) 150/56 130/61 (!) 145/55 (!) 144/54  Pulse: 70 71    Resp: 16 20 20 19   Temp: 98.4 F (36.9 C) 98.8 F (37.1 C) 98.5 F (36.9 C) 98.1 F (36.7 C)  TempSrc: Oral Oral Oral Oral  SpO2: 100% 96%    Weight:  133.9 kg    Height:        Intake/Output Summary (Last 24 hours) at 04/20/2023 1223 Last data filed at 04/20/2023 0500 Gross per 24 hour  Intake --  Output 400 ml  Net -400 ml     Wt Readings from Last 3 Encounters:  04/20/23 133.9 kg  01/28/23 (!) 142.9 kg  01/09/23 (!) 145 kg   Physical Exam General: Alert and oriented x 3, NAD Cardiovascular: S1 S2 clear, RRR.  Respiratory: CTAB, no wheezing Gastrointestinal: Soft, nontender, nondistended, NBS Ext: RLE in boot, dressing intact Neuro: no new deficits Psych: Normal affect      Data Reviewed:  I have personally  reviewed following labs    CBC Lab Results  Component Value Date   WBC 12.1 (H) 04/20/2023   RBC 2.50 (L) 04/20/2023   HGB 7.4 (L) 04/20/2023   HCT 22.0 (L) 04/20/2023   MCV 88.0 04/20/2023   MCH 29.6 04/20/2023   PLT 143 (L) 04/20/2023   MCHC 33.6 04/20/2023   RDW 16.9 (H) 04/20/2023   LYMPHSABS 0.9 04/15/2023   MONOABS 0.6 04/15/2023   EOSABS 0.3 04/15/2023   BASOSABS 0.1 04/15/2023     Last metabolic panel Lab Results  Component Value Date   NA 134 (L) 04/20/2023   K 3.5 04/20/2023   CL 98 04/20/2023   CO2 17 (L) 04/20/2023   BUN 48 (H) 04/20/2023   CREATININE 4.62 (H) 04/20/2023   GLUCOSE 214 (H) 04/20/2023   GFRNONAA 13 (L) 04/20/2023   GFRAA 47 (L) 09/28/2019   CALCIUM 9.2 04/20/2023   PHOS 3.8 04/20/2023   PROT 5.8 (L) 04/16/2023   ALBUMIN 2.7 (L) 04/20/2023   LABGLOB 2.2 04/16/2023   AGRATIO 1.7 04/16/2023   BILITOT 0.6 04/16/2023   ALKPHOS 73 04/16/2023   AST 50 (H) 04/16/2023   ALT 42 04/16/2023   ANIONGAP 19 (H) 04/20/2023    CBG (last 3)  Recent Labs    04/20/23 0013 04/20/23 0537 04/20/23 1140  GLUCAP 222* 196* 214*      Coagulation Profile: No results for input(s): "INR", "PROTIME" in the last 168 hours.   Radiology Studies: I have personally reviewed the imaging studies  MR BRAIN WO CONTRAST Result Date: 04/19/2023 CLINICAL DATA:  Right facial droop and slurred speech. EXAM: MRI HEAD WITHOUT CONTRAST TECHNIQUE: Multiplanar, multiecho pulse sequences of the brain and surrounding structures were obtained without intravenous contrast. COMPARISON:  CT head without contrast 04/19/2023. FINDINGS: Brain: No acute infarct, hemorrhage, or mass lesion is present. Moderate generalized atrophy is present. Mild white matter changes are noted bilaterally. The ventricles are proportionate to the degree of atrophy. Deep brain nuclei are within normal limits. No significant extraaxial fluid collection is present. The brainstem and cerebellum are within  normal limits. The internal auditory canals are within normal limits. Midline structures are within normal limits. Vascular: Flow is present in the major intracranial arteries. Skull and upper cervical spine: The craniocervical junction is normal. Upper cervical spine is within normal limits. Marrow signal is unremarkable. Sinuses/Orbits: The paranasal sinuses and mastoid air cells are clear. Bilateral lens replacements are noted. Globes and orbits are otherwise unremarkable. IMPRESSION: 1. No acute intracranial abnormality or significant interval change. 2. Moderate generalized atrophy and white matter disease likely reflects the sequela of chronic microvascular ischemia. Electronically Signed   By: Marin Roberts M.D.   On: 04/19/2023 19:06   CT HEAD CODE STROKE WO CONTRAST Result Date: 04/19/2023 CLINICAL DATA:  Code stroke. Acute onset of right upper extremity weakness and facial droop. Abnormal speech. EXAM: CT HEAD WITHOUT CONTRAST TECHNIQUE: Contiguous axial images were obtained from the base of the skull through the vertex without intravenous contrast. RADIATION DOSE  REDUCTION: This exam was performed according to the departmental dose-optimization program which includes automated exposure control, adjustment of the mA and/or kV according to patient size and/or use of iterative reconstruction technique. COMPARISON:  CT head without contrast 01/09/2023 FINDINGS: Brain: Mild atrophy and white matter changes are similar the prior exam. No acute infarct, hemorrhage, or mass lesion is present. The ventricles are of normal size. No significant extraaxial fluid collection is present. Midline structures are within normal limits. The brainstem and cerebellum are within normal limits. Vascular: Atherosclerotic calcifications are present within the cavernous internal carotid arteries and at the dural margins of both vertebral arteries bilaterally. Skull: Calvarium is intact. No focal lytic or blastic lesions  are present. No significant extracranial soft tissue lesion is present. Sinuses/Orbits: Small polyps or mucous retention cysts are present along the inferior maxillary sinuses bilaterally. No fluid levels are present. The paranasal sinuses and mastoid air cells are otherwise clear. ASPECTS [facility] Stroke Program Early CT Score) - Ganglionic level infarction (caudate, lentiform nuclei, internal capsule, insula, M1-M3 cortex): 7/7 - Supraganglionic infarction (M4-M6 cortex): 3/3 Total score (0-10 with 10 being normal): /10 IMPRESSION: 1. No acute intracranial abnormality or significant interval change. 2. Stable atrophy and white matter disease. This likely reflects the sequela of chronic microvascular ischemia. 3. Aspects is 10. The above was relayed via text pager to Dr. Brooke Dare on 04/19/2023 at 13:39 . Electronically Signed   By: Marin Roberts M.D.   On: 04/19/2023 13:39       Samul Mcinroy M.D. [facility] 04/20/2023, 12:23 PM  Available via Epic secure chat 7am-7pm After 7 pm, please refer to night coverage provider listed on amion.

## 2023-04-21 ENCOUNTER — Other Ambulatory Visit (HOSPITAL_COMMUNITY): Payer: Self-pay

## 2023-04-21 DIAGNOSIS — I10 Essential (primary) hypertension: Secondary | ICD-10-CM | POA: Diagnosis not present

## 2023-04-21 DIAGNOSIS — L97314 Non-pressure chronic ulcer of right ankle with necrosis of bone: Secondary | ICD-10-CM | POA: Diagnosis not present

## 2023-04-21 DIAGNOSIS — J101 Influenza due to other identified influenza virus with other respiratory manifestations: Secondary | ICD-10-CM | POA: Diagnosis not present

## 2023-04-21 DIAGNOSIS — I480 Paroxysmal atrial fibrillation: Secondary | ICD-10-CM | POA: Diagnosis not present

## 2023-04-21 LAB — BPAM RBC
Blood Product Expiration Date: 202503252359
ISSUE DATE / TIME: 202503021612
Unit Type and Rh: 6200

## 2023-04-21 LAB — RENAL FUNCTION PANEL
Albumin: 2.5 g/dL — ABNORMAL LOW (ref 3.5–5.0)
Anion gap: 15 (ref 5–15)
BUN: 64 mg/dL — ABNORMAL HIGH (ref 8–23)
CO2: 19 mmol/L — ABNORMAL LOW (ref 22–32)
Calcium: 8.5 mg/dL — ABNORMAL LOW (ref 8.9–10.3)
Chloride: 97 mmol/L — ABNORMAL LOW (ref 98–111)
Creatinine, Ser: 6.34 mg/dL — ABNORMAL HIGH (ref 0.61–1.24)
GFR, Estimated: 9 mL/min — ABNORMAL LOW (ref >60)
Glucose, Bld: 185 mg/dL — ABNORMAL HIGH (ref 70–99)
Phosphorus: 5 mg/dL — ABNORMAL HIGH (ref 2.5–4.6)
Potassium: 3.6 mmol/L (ref 3.5–5.1)
Sodium: 131 mmol/L — ABNORMAL LOW (ref 135–145)

## 2023-04-21 LAB — TYPE AND SCREEN
ABO/RH(D): A POS
Antibody Screen: NEGATIVE
Unit division: 0

## 2023-04-21 LAB — CBC
HCT: 22.6 % — ABNORMAL LOW (ref 39.0–52.0)
Hemoglobin: 7.7 g/dL — ABNORMAL LOW (ref 13.0–17.0)
MCH: 29.7 pg (ref 26.0–34.0)
MCHC: 34.1 g/dL (ref 30.0–36.0)
MCV: 87.3 fL (ref 80.0–100.0)
Platelets: 123 10*3/uL — ABNORMAL LOW (ref 150–400)
RBC: 2.59 MIL/uL — ABNORMAL LOW (ref 4.22–5.81)
RDW: 16.3 % — ABNORMAL HIGH (ref 11.5–15.5)
WBC: 8.7 10*3/uL (ref 4.0–10.5)
nRBC: 0 % (ref 0.0–0.2)

## 2023-04-21 LAB — GLUCOSE, CAPILLARY
Glucose-Capillary: 160 mg/dL — ABNORMAL HIGH (ref 70–99)
Glucose-Capillary: 172 mg/dL — ABNORMAL HIGH (ref 70–99)
Glucose-Capillary: 187 mg/dL — ABNORMAL HIGH (ref 70–99)
Glucose-Capillary: 195 mg/dL — ABNORMAL HIGH (ref 70–99)
Glucose-Capillary: 203 mg/dL — ABNORMAL HIGH (ref 70–99)

## 2023-04-21 MED ORDER — GABAPENTIN 300 MG PO CAPS
300.0000 mg | ORAL_CAPSULE | Freq: Every day | ORAL | Status: DC
  Administered 2023-04-22 – 2023-05-09 (×18): 300 mg via ORAL
  Filled 2023-04-21 (×18): qty 1

## 2023-04-21 MED ORDER — ALLOPURINOL 100 MG PO TABS
50.0000 mg | ORAL_TABLET | Freq: Every day | ORAL | Status: DC
  Administered 2023-04-22 – 2023-05-10 (×19): 50 mg via ORAL
  Filled 2023-04-21 (×19): qty 1

## 2023-04-21 NOTE — Progress Notes (Signed)
 Patient's RN reported that patient has been n.p.o. for whole day.  Per chart review patient has been waiting for evaluated by orthopedist Dr. Lajoyce Corners.  Resuming diet for tonight.  Will keep patient n.p.o. after midnight.   Tereasa Coop, MD [facility] 04/21/2023, 7:41 PM

## 2023-04-21 NOTE — Progress Notes (Signed)
 Physical Therapy Treatment Patient Details Name: Harold [name] MRN: [id number] DOB: 11/17/1955 Today's Date: 04/21/2023   History of Present Illness 68 y.o. male presents to [facility] 03/27/23 with productive cough and generalized weakness. Admitted with influenza A and cellulitis of R foot s/p hardware removal of the R ankle and application of wound vac on 2/10, wound vac removed 2/27. Left thigh pain, CT negative. Prior admit 11/24 for R ankle ORIF and in 12/24 for revision. S/p excisional debridement of right ankle on 2/12. Pt underwent central line placement on 04/07/2023 however he has been having issues with recurrent bleeding from the insertion site which has finally stopped. 04/17/23 pt with drop in sats requiring 2 L of O2 via [place], chest xray concerning for edema or possibly pneumonia.Significant PMH: HTN, HLD, CAD s/p PCI, COPD, DM2, CKD stage IIIb, PVD, gout, OSA, neuropathy, COPD, a-fib, CAD s/p percutaneous coronary angioplasty, angina, obesity    PT Comments  STAR PT/OT Session: Pt very lethargic, laying flat in bed on entry. Very slowed, slightly slurred speech, requiring increased stimulation to engage in conversation. Ultimately, pt agreeable to trying to get to EoB. Pt on 2L O2 via [place] on entry, but able to maintain SpO2 >90%O2 on RA throughout session. Pt requires maxAx2 for rolling and totalAx2 for coming to sit EoB. Pt continued with decreased alertness and slight dizziness. Agreeable to attempt to scoot towards HoB, requiring totalAx2. And totalAx2 to return to supine. STAR PT/OT to reassess pt tomorrow for ability to participate in STAR program in a way to make appreciable gains to return home.  If not will return to regular PT/OT caseload.  Orthostatic BPs  Supine 117/61 (77)  Sitting 109/53 (70)  Sitting after 3 min 108/54 (70)      If plan is discharge home, recommend the following: A lot of help with walking and/or transfers;A lot of help with bathing/dressing/bathroom;Assistance  with cooking/housework;Assist for transportation;Help with stairs or ramp for entrance   Can travel by private vehicle     Yes  Equipment Recommendations  BSC/3in1 (Bariatric; Pt has a tub bench, w/c, RW, and sliding board already)       Precautions / Restrictions Precautions Precautions: Fall Recall of Precautions/Restrictions: Impaired Precaution/Restrictions Comments: Watch BP Required Braces or Orthoses: Other Brace Other Brace: R CAM boot for ambulation, R splint in bed, abdominal binder (Prn) Restrictions Weight Bearing Restrictions Per Provider Order: Yes RLE Weight Bearing Per Provider Order: Weight bearing as tolerated Other Position/Activity Restrictions: per Dr. Audrie Lia note 04/03/2023 and orders 04/03/2023 pt WBAT     Mobility  Bed Mobility Overal bed mobility: Needs Assistance Bed Mobility: Supine to Sit Rolling: Max assist   Supine to sit: HOB elevated, Total assist, +2 for physical assistance Sit to supine: Total assist, +2 for physical assistance   General bed mobility comments: max A for moving LE to EoB, total assist to bring trunk to upright, total A for returning to supine    Transfers Overall transfer level: Needs assistance   Transfers: Bed to chair/wheelchair/BSC            Lateral/Scoot Transfers: +2 physical assistance, Total assist General transfer comment: totalAx2 for scooting transfer along EoB with use of pad vc for forward lean, very little assistance with scooting        Balance Overall balance assessment: Needs assistance Sitting-balance support: Feet supported, Bilateral upper extremity supported Sitting balance-Leahy Scale: Fair Sitting balance - Comments: requries UE support to maintain balance  Communication Communication Communication: Impaired Factors Affecting Communication: Reduced clarity of speech  Cognition Arousal: Lethargic Behavior During Therapy: Flat affect   PT  - Cognitive impairments: Memory, Safety/Judgement, Problem solving                       PT - Cognition Comments: garbled speech, decreased awareness Following commands: Impaired Following commands impaired: Follows one step commands with increased time, Follows multi-step commands with increased time    Cueing Cueing Techniques: Verbal cues, Tactile cues     General Comments General comments (skin integrity, edema, etc.): Pt on 2L O2 via [place] on entry, SpO2 98%O2, removed [place] and pt able to maintain SpO2 >90%O2 throughout session, BP low with coming to EoB pt reports slight dizziness      Pertinent Vitals/Pain Pain Assessment Pain Assessment: Faces Faces Pain Scale: Hurts little more Pain Location: generalized Pain Descriptors / Indicators: Discomfort, Guarding Pain Intervention(s): Limited activity within patient's tolerance, Monitored during session, Repositioned     PT Goals (current goals can now be found in the care plan section) Acute Rehab PT Goals Patient Stated Goal: Return home confidently PT Goal Formulation: With patient Time For Goal Achievement: 05/01/23 Potential to Achieve Goals: Good Progress towards PT goals: Not progressing toward goals - comment    Frequency    Min 1X/week           Co-evaluation PT/OT/SLP Co-Evaluation/Treatment: Yes Reason for Co-Treatment: To address functional/ADL transfers;For patient/therapist safety;Other (comment) (activity tolerance) PT goals addressed during session: Mobility/safety with mobility;Balance OT goals addressed during session: ADL's and self-care      AM-PAC PT "6 Clicks" Mobility   Outcome Measure  Help needed turning from your back to your side while in a flat bed without using bedrails?: A Little Help needed moving from lying on your back to sitting on the side of a flat bed without using bedrails?: A Lot Help needed moving to and from a bed to a chair (including a wheelchair)?: A Lot Help  needed standing up from a chair using your arms (e.g., wheelchair or bedside chair)?: A Lot Help needed to walk in hospital room?: Total Help needed climbing 3-5 steps with a railing? : Total 6 Click Score: 11    End of Session Equipment Utilized During Treatment: Gait belt Activity Tolerance: Patient limited by lethargy Patient left: with call bell/phone within reach;in bed;Other (comment) (lab tech drawing blood) Nurse Communication: Mobility status PT Visit Diagnosis: Other abnormalities of gait and mobility (R26.89);Unsteadiness on feet (R26.81);Muscle weakness (generalized) (M62.81);History of falling (Z91.81);Difficulty in walking, not elsewhere classified (R26.2)     Time: 2952-8413 PT Time Calculation (min) (ACUTE ONLY): 24 min  Charges:    $Therapeutic Activity: 8-22 mins PT General Charges $$ ACUTE PT VISIT: 1 Visit                     Gricel Copen B. Beverely Risen PT, DPT Acute Rehabilitation Services Please use secure chat or  Call Office [phone]    Elon Alas Fleet 04/21/2023, 11:00 AM

## 2023-04-21 NOTE — Progress Notes (Signed)
 [facility]                                                                              Harold [name], is a 68 y.o. male, DOB - 07-25-1955, WUJ:[id number] Admit date - 03/27/2023    Outpatient Primary MD for the patient is Harold Northern, MD  LOS - 25  days  Chief Complaint  Patient presents with   Cough       Brief summary  Patient is a 68 year old male with morbid obesity, OSA, DM2, HTN, HLD, CAD/stent, A-fib on Eliquis, carotid artery stenosis, CKD, chronic anemia, anxiety/depression, diabetic neuropathy, GERD, hypothyroidism, COPD, pulmonary hypertension presented to ED on 2/6 with cough, subjective fever, myalgia, generalized weakness for 5 days. Respiratory virus panel positive for influenza A. Chest x-ray negative for infiltrates.  Noted to have right leg cellulitis.  Subsequent evaluation showed right leg wound infection/osteomyelitis. He has previous right ankle fracture with instrumentation.  He underwent surgery and hardware removal.  Surgical culture grew Enterobacter cloacae and vancomycin-resistant Staph epidermidis. Remained on broad-spectrum antibiotics but he developed AKI with concern for AIN.  Antibiotics discontinued after discussion between nephrology and ID.   Assessment & Plan  Right leg wound infection/osteomyelitis right ankle s/p hardware removal Enterobacter Cloacea, MRSE  -Prior right ankle surgery (12/2022) complicated by postsurgical wound infection. -S/p right ankle hardware removal, I&D and wound VAC placement on 2/10 by Dr. Jena Gauss -S/p repeat debridement of the right ankle by Dr. Lajoyce Corners 04/02/2023.   -ID following-was on broad-spectrum antibiotics but held due to worsening renal function. -Dr. Lajoyce Corners to evaluate the wound -PT recommended SNF however patient will have to pay out-of-pocket and unable to afford.    AKI on CKD stage IIIb: renal function progressively worse. Nephrotic range proteinuria   Recent Labs    04/12/23 0415  04/13/23 0815 04/14/23 0423 04/15/23 0357 04/16/23 0752 04/17/23 1005 04/18/23 0542 04/19/23 0531 04/20/23 0619 04/21/23 1042  BUN 33* 35* 36* 36* 36* 33* 36* 39* 48* 64*  CREATININE 2.62* 2.85* 2.98* 3.04* 3.20* 3.21* 3.38* 3.56* 4.62* 6.34*  -Appreciate nephrology input-n.p.o. after midnight for potential catheter -Seems renal biopsy was entertained at some point but patient is on Plavix.  Acute metabolic encephalopathy: Uremia?  He was also on cefepime at some point.  MRI brain on 3/1 without acute finding.  Was not receiving pain medications. -Check basic encephalopathy labs including VBG, TSH, B12 and B1. -Continue Cymbalta.  Right facial droop: Transient likely from encephalopathy.  MRI without acute finding. -Currently on Plavix.  PAD: Seen by vascular surgery underwent aortogram, right leg angiogram, successful stenting of right superficial femoral artery on 04/01/2023.   -on Plavix and heparin gtt   IDDM-2 with hyperglycemia: A1c 7.0% in 12/2022.  On Tresiba, Humulin R and Mounjaro. Recent Labs  Lab 04/20/23 1140 04/20/23 1715 04/21/23 0029 04/21/23 0612 04/21/23 1200  GLUCAP 214* 194* 203* 195* 187*  -Continue current insulin regimen.  Will not escalate with worsening renal function.  Diabetic neuropathy: PTA reportedly on gabapentin 1600 mg 3 times daily. -Gabapentin decreased to 300 mg nightly due to worsening renal function. -Continue home Cymbalta.   Paroxysmal A-fib:  Rate controlled.  On Coreg and amiodarone at home.  CHA2DS2-VASc score 4. -Anticoagulation held due to bleeding from CVL. -Continue holding heparin in case he need dialysis catheter on 3/4. -Optimize electrolytes.  Anemia of chronic disease: Baseline Hgb 10.5-11.5.  Transfused 1 unit on 3/2. Recent Labs    04/13/23 0815 04/14/23 0423 04/15/23 0357 04/15/23 1452 04/16/23 0752 04/17/23 1003 04/18/23 0542 04/19/23 0531 04/20/23 0619 04/21/23 0525  HGB 10.4* 9.8* 9.1* 9.2* 8.0* 8.0*  8.3* 8.1* 7.4* 7.7*  -Continue holding anticoagulation -Continue monitoring   CAD/stent/carotid artery stenosis/hyperlipidemia -Continue Plavix, Crestor and Zetia. -Eliquis held for now, continue heparin gtt and monitor for any bleeding issues  Orthostatic hypotension: TTE with LVEF of 55 to 60%, basal inferior hypokinesis and G2 DD. -Doing abdominal binder  Hypokalemia/hypomagnesemia: Improved -Monitor replenish as appropriate.  Essential hypertension -Continue low-dose Coreg  Hypothyroidism -Continue Synthroid   Obstructive sleep apnea -CPAP nightly   Gout -Continue allopurinol, colchicine   Influenza A: Felt to be outside the window for treatment and Tamiflu deferred.  X-ray was without infiltrate.  Acute diarrhea: Resolved  Advance care planning: Significant comorbidity as above.  Gradual decline. -Palliative medicine consulted    Moderate obesity, class II Estimated body mass index is 39.18 kg/m as calculated from the following:   Height as of this encounter: 6\' 1"  (1.854 m).   Weight as of this encounter: 134.7 kg.  Code Status: Full code DVT Prophylaxis:  Place and maintain sequential compression device Start: 04/15/23 1549 SCDs Start: 04/02/23 1617 SCDs Start: 03/31/23 1620 SCDs Start: 03/27/23 2243   Level of Care:  Progressive Cardiac Family Communication:  Disposition Plan:      Remains inpatient appropriate:   Encephalopathy, renal failure, osteomyelitis   Procedures:  2D echo  Consultants:   Renal Ortho ID  Palliative medicine  Antimicrobials:   Anti-infectives (From admission, onward)    Start     Dose/Rate Route Frequency Ordered Stop   04/19/23 1000  ertapenem (INVANZ) 500 mg in sodium chloride 0.9 % 50 mL IVPB  Status:  Discontinued        500 mg 100 mL/hr over 30 Minutes Intravenous Daily 04/18/23 0931 04/18/23 1106   04/17/23 1800  ertapenem (INVANZ) 1 g in sodium chloride 0.9 % 100 mL IVPB  Status:  Discontinued        1 g 200  mL/hr over 30 Minutes Intravenous Daily 04/17/23 1354 04/18/23 0931   04/08/23 0000  daptomycin (CUBICIN) IVPB  Status:  Discontinued        700 mg Intravenous Every 24 hours 04/08/23 1147 04/08/23    04/08/23 0000  ceFEPime (MAXIPIME) IVPB  Status:  Discontinued        2 g Intravenous Every 12 hours 04/08/23 1147 04/18/23    04/08/23 0000  daptomycin (CUBICIN) IVPB  Status:  Discontinued        700 mg Intravenous Every 24 hours 04/08/23 1150 04/18/23    04/04/23 1400  DAPTOmycin (CUBICIN) IVPB 700 mg/136mL premix  Status:  Discontinued        700 mg 200 mL/hr over 30 Minutes Intravenous Daily 04/03/23 1435 04/18/23 1106   04/02/23 1715  ceFAZolin (ANCEF) IVPB 2g/100 mL premix  Status:  Discontinued        2 g 200 mL/hr over 30 Minutes Intravenous Every 8 hours 04/02/23 1616 04/02/23 1623   04/02/23 0600  ceFAZolin (ANCEF) IVPB 3g/150 mL premix  Status:  Discontinued        3 g 300  mL/hr over 30 Minutes Intravenous On call to O.R. 04/01/23 1700 04/02/23 1549   03/31/23 1015  ceFAZolin (ANCEF) IVPB 2g/100 mL premix        2 g 200 mL/hr over 30 Minutes Intravenous On call to O.R. 03/31/23 0921 03/31/23 1203   03/29/23 2200  vancomycin (VANCOREADY) IVPB 2000 mg/400 mL  Status:  Discontinued       Placed in "Followed by" Linked Group   2,000 mg 200 mL/hr over 120 Minutes Intravenous Every 48 hours 03/27/23 2257 04/03/23 1435   03/28/23 1045  metroNIDAZOLE (FLAGYL) tablet 500 mg  Status:  Discontinued        500 mg Oral Every 12 hours 03/28/23 0955 04/03/23 0945   03/27/23 2330  vancomycin (VANCOCIN) 2,500 mg in sodium chloride 0.9 % 500 mL IVPB       Placed in "Followed by" Linked Group   2,500 mg 262.5 mL/hr over 120 Minutes Intravenous  Once 03/27/23 2257 03/28/23 0241   03/27/23 2300  metroNIDAZOLE (FLAGYL) IVPB 500 mg  Status:  Discontinued        500 mg 100 mL/hr over 60 Minutes Intravenous Every 12 hours 03/27/23 2247 03/28/23 0955   03/27/23 2300  ceFEPIme (MAXIPIME) 2 g in sodium  chloride 0.9 % 100 mL IVPB  Status:  Discontinued        2 g 200 mL/hr over 30 Minutes Intravenous Every 12 hours 03/27/23 2257 04/17/23 1354   03/27/23 2100  ceFAZolin (ANCEF) IVPB 2g/100 mL premix        2 g 200 mL/hr over 30 Minutes Intravenous  Once 03/27/23 2058 03/27/23 2219          Medications  allopurinol  100 mg Oral Daily   amiodarone  200 mg Oral BID   carvedilol  3.125 mg Oral BID WC   Chlorhexidine Gluconate Cloth  6 each Topical Q0600   clopidogrel  75 mg Oral Q breakfast   docusate sodium  100 mg Oral BID   DULoxetine  60 mg Oral Daily   ezetimibe  10 mg Oral Daily   [START ON 04/22/2023] gabapentin  300 mg Oral QHS   guaiFENesin  600 mg Oral BID   insulin aspart  0-9 Units Subcutaneous Q6H   insulin glargine  40 Units Subcutaneous Daily   levothyroxine  88 mcg Oral QAC breakfast   polyethylene glycol  17 g Oral Daily   sodium chloride flush  3 mL Intravenous Q12H   sodium chloride flush  3-10 mL Intravenous Q12H   tamsulosin  0.4 mg Oral Daily      Subjective:  Seen and examined earlier this morning.  No major events overnight of this morning.  He is sleepy but wakes to voice.  He is not alert.  He is oriented to self, place, month and year.  He denies pain or shortness of breath.  Objective:   Vitals:   04/20/23 1900 04/20/23 1930 04/21/23 0420 04/21/23 1230  BP: (!) 133/56 137/62 (!) 139/59 (!) 122/45  Pulse:  62 65 62  Resp: 19 20 (!) 21 18  Temp: 98.5 F (36.9 C) 99.7 F (37.6 C) 98.9 F (37.2 C) 99.3 F (37.4 C)  TempSrc: Oral Oral Axillary Oral  SpO2: 96% 99% 97% 92%  Weight:   134.7 kg   Height:        Intake/Output Summary (Last 24 hours) at 04/21/2023 1522 Last data filed at 04/20/2023 2100 Gross per 24 hour  Intake 361.5 ml  Output 150  ml  Net 211.5 ml     Wt Readings from Last 3 Encounters:  04/21/23 134.7 kg  01/28/23 (!) 142.9 kg  01/09/23 (!) 145 kg   Physical Exam GENERAL: No apparent distress.  Nontoxic. HEENT: MMM.   Vision and hearing grossly intact.  NECK: Supple.  Able to assess JVD due to body habitus. RESP:  No IWOB.  Fair aeration bilaterally. CVS:  RRR. Heart sounds normal.  ABD/GI/GU: BS+. Abd soft, NTND.  Occult exam due to poor diabetes. MSK/EXT:   No apparent deformity.  Dressing over right foot/leg DCI. SKIN: no apparent skin lesion or wound NEURO: Sleepy but wakes to voice.  Not alert.  He is oriented to self, place, month and year and follows commands.  No focal neurodeficit other than generalized weakness. PSYCH: Calm. Normal affect.      Data Reviewed:  I have personally reviewed following labs    CBC Lab Results  Component Value Date   WBC 8.7 04/21/2023   RBC 2.59 (L) 04/21/2023   HGB 7.7 (L) 04/21/2023   HCT 22.6 (L) 04/21/2023   MCV 87.3 04/21/2023   MCH 29.7 04/21/2023   PLT 123 (L) 04/21/2023   MCHC 34.1 04/21/2023   RDW 16.3 (H) 04/21/2023   LYMPHSABS 0.9 04/15/2023   MONOABS 0.6 04/15/2023   EOSABS 0.3 04/15/2023   BASOSABS 0.1 04/15/2023     Last metabolic panel Lab Results  Component Value Date   NA 131 (L) 04/21/2023   K 3.6 04/21/2023   CL 97 (L) 04/21/2023   CO2 19 (L) 04/21/2023   BUN 64 (H) 04/21/2023   CREATININE 6.34 (H) 04/21/2023   GLUCOSE 185 (H) 04/21/2023   GFRNONAA 9 (L) 04/21/2023   GFRAA 47 (L) 09/28/2019   CALCIUM 8.5 (L) 04/21/2023   PHOS 5.0 (H) 04/21/2023   PROT 5.8 (L) 04/16/2023   ALBUMIN 2.5 (L) 04/21/2023   LABGLOB 2.2 04/16/2023   AGRATIO 1.7 04/16/2023   BILITOT 0.6 04/16/2023   ALKPHOS 73 04/16/2023   AST 50 (H) 04/16/2023   ALT 42 04/16/2023   ANIONGAP 15 04/21/2023    CBG (last 3)  Recent Labs    04/21/23 0029 04/21/23 0612 04/21/23 1200  GLUCAP 203* 195* 187*      Coagulation Profile: No results for input(s): "INR", "PROTIME" in the last 168 hours.   Radiology Studies: I have personally reviewed the imaging studies  MR BRAIN WO CONTRAST Result Date: 04/19/2023 CLINICAL DATA:  Right facial droop and  slurred speech. EXAM: MRI HEAD WITHOUT CONTRAST TECHNIQUE: Multiplanar, multiecho pulse sequences of the brain and surrounding structures were obtained without intravenous contrast. COMPARISON:  CT head without contrast 04/19/2023. FINDINGS: Brain: No acute infarct, hemorrhage, or mass lesion is present. Moderate generalized atrophy is present. Mild white matter changes are noted bilaterally. The ventricles are proportionate to the degree of atrophy. Deep brain nuclei are within normal limits. No significant extraaxial fluid collection is present. The brainstem and cerebellum are within normal limits. The internal auditory canals are within normal limits. Midline structures are within normal limits. Vascular: Flow is present in the major intracranial arteries. Skull and upper cervical spine: The craniocervical junction is normal. Upper cervical spine is within normal limits. Marrow signal is unremarkable. Sinuses/Orbits: The paranasal sinuses and mastoid air cells are clear. Bilateral lens replacements are noted. Globes and orbits are otherwise unremarkable. IMPRESSION: 1. No acute intracranial abnormality or significant interval change. 2. Moderate generalized atrophy and white matter disease likely reflects the sequela of  chronic microvascular ischemia. Electronically Signed   By: Marin Roberts M.D.   On: 04/19/2023 19:06       Almon Hercules M.D. [facility] 04/21/2023, 3:22 PM  Available via Epic secure chat 7am-7pm After 7 pm, please refer to night coverage provider listed on amion.

## 2023-04-21 NOTE — Progress Notes (Signed)
 Occupational Therapy Treatment Patient Details Name: Harold [name] MRN: [id number] DOB: Apr 29, 1955 Today's Date: 04/21/2023   History of present illness 68 y.o. male presents [facility] 03/27/23 with productive cough and generalized weakness. Admitted with influenza A and cellulitis of R foot s/p hardware removal of the R ankle and application of wound vac on 2/10, wound vac removed 2/27. Left thigh pain, CT negative. Prior admit 11/24 for R ankle ORIF and in 12/24 for revision. S/p excisional debridement of right ankle on 2/12. Pt underwent central line placement on 04/07/2023 however he has been having issues with recurrent bleeding from the insertion site which has finally stopped. 04/17/23 pt with drop in sats requiring 2 L of O2 via , chest xray concerning for edema or possibly pneumonia.Significant PMH: HTN, HLD, CAD s/p PCI, COPD, DM2, CKD stage IIIb, PVD, gout, OSA, neuropathy, COPD, a-fib, CAD s/p percutaneous coronary angioplasty, angina, obesity   OT comments  STAR OT/PT session: Patient received in supine and lethargic. Patient with slurred speech and difficulty to understand at times. Patient given wash cloth to assist with increasing alertness with washing face and required mod assist to complete. Patient agreeable to EOB  with therapy and requiring total assist +2. Patient continued to have decreased alertness and slight dizziness on EOB. Patient instructed on scooting towards [facility] and required total assist +2 to perform. Patient was total assist to return to supine. STAR program to attempt treatment again tomorrow to assess for appropriateness for program. Acute OT to continue to follow to address established goals to facilitate DC To next venue of care.       If plan is discharge home, recommend the following:  A lot of help with walking and/or transfers;A lot of help with bathing/dressing/bathroom;Assistance with cooking/housework;Help with stairs or ramp for entrance;Assist for  transportation   Equipment Recommendations  Other (comment) (bariatric BSC)    Recommendations for Other Services      Precautions / Restrictions Precautions Precautions: Fall Recall of Precautions/Restrictions: Impaired Precaution/Restrictions Comments: Watch BP Required Braces or Orthoses: Other Brace Other Brace: R CAM boot for ambulation, R splint in bed, abdominal binder (Prn) Restrictions Weight Bearing Restrictions Per Provider Order: Yes RLE Weight Bearing Per Provider Order: Weight bearing as tolerated Other Position/Activity Restrictions: per Dr. Audrie Lia note 04/03/2023 and orders 04/03/2023 pt WBAT       Mobility Bed Mobility Overal bed mobility: Needs Assistance Bed Mobility: Rolling, Supine to Sit, Sit to Supine Rolling: Max assist   Supine to sit: HOB elevated, Total assist, +2 for physical assistance Sit to supine: Total assist, +2 for physical assistance   General bed mobility comments: max A for moving LE to EoB, total assist to bring trunk to upright, total A for returning to supine    Transfers Overall transfer level: Needs assistance                Lateral/Scoot Transfers: +2 physical assistance, Total assist General transfer comment: lateral scooting to left to get close to [facility] with limited to no participation from patient due to fatigue     Balance Overall balance assessment: Needs assistance Sitting-balance support: Feet supported, Bilateral upper extremity supported Sitting balance-Leahy Scale: Fair Sitting balance - Comments: requries UE support to maintain balance                                   ADL either performed or assessed with clinical judgement  ADL Overall ADL's : Needs assistance/impaired     Grooming: Wash/dry face;Moderate assistance;Bed level Grooming Details (indicate cue type and reason): patient required increased assistance due to lethargic             Lower Body Dressing: Total assistance;Bed  level Lower Body Dressing Details (indicate cue type and reason): donned CAM boot and splint for RLE               General ADL Comments: Patient requiring increased assistance due to lethargic    Extremity/Trunk Assessment              Vision       Perception     Praxis     Communication Communication Communication: Impaired Factors Affecting Communication: Reduced clarity of speech   Cognition Arousal: Lethargic Behavior During Therapy: Flat affect                                 Following commands: Impaired Following commands impaired: Follows one step commands with increased time, Follows multi-step commands with increased time      Cueing   Cueing Techniques: Verbal cues, Tactile cues  Exercises      Shoulder Instructions       General Comments patient on 2liters O@ upon entry wit SpO2 98%. O2 removed and patient maintian SpO2 in 90's.    Pertinent Vitals/ Pain       Pain Assessment Pain Assessment: Faces Faces Pain Scale: Hurts little more Pain Location: generalized Pain Descriptors / Indicators: Discomfort, Guarding Pain Intervention(s): Limited activity within patient's tolerance, Monitored during session, Repositioned  Home Living                                          Prior Functioning/Environment              Frequency  Min 1X/week        Progress Toward Goals  OT Goals(current goals can now be found in the care plan section)  Progress towards OT goals: Progressing toward goals  Acute Rehab OT Goals Patient Stated Goal: none stated OT Goal Formulation: With patient Time For Goal Achievement: 05/01/23 Potential to Achieve Goals: Good ADL Goals Pt Will Perform Grooming: with set-up;sitting (wash face, mouth care) Pt Will Perform Lower Body Dressing: with min assist;with adaptive equipment;with caregiver independent in assisting;sitting/lateral leans;sit to/from stand Pt Will Transfer to  Toilet: stand pivot transfer;with min assist;bedside commode Pt Will Perform Toileting - Clothing Manipulation and hygiene: with min assist;sitting/lateral leans;sit to/from stand Pt/caregiver will Perform Home Exercise Program: Increased strength;Both right and left upper extremity;With theraband;With written HEP provided (level 2-3) Additional ADL Goal #1: Pt will be Mod I in and OOB for basic ADLs  Plan      Co-evaluation    PT/OT/SLP Co-Evaluation/Treatment: Yes Reason for Co-Treatment: To address functional/ADL transfers;For patient/therapist safety;Other (comment) (activity tolerance) PT goals addressed during session: Mobility/safety with mobility;Balance OT goals addressed during session: ADL's and self-care      AM-PAC OT "6 Clicks" Daily Activity     Outcome Measure   Help from another person eating meals?: None Help from another person taking care of personal grooming?: A Little Help from another person toileting, which includes using toliet, bedpan, or urinal?: A Lot Help from another person bathing (including washing, rinsing, drying)?: A Lot  Help from another person to put on and taking off regular upper body clothing?: A Little Help from another person to put on and taking off regular lower body clothing?: Total 6 Click Score: 15    End of Session    OT Visit Diagnosis: Unsteadiness on feet (R26.81);Other abnormalities of gait and mobility (R26.89);Muscle weakness (generalized) (M62.81);Pain Pain - part of body:  (generalized)   Activity Tolerance Patient limited by lethargy   Patient Left in bed;with call bell/phone within reach;with bed alarm set   Nurse Communication Mobility status        Time: 8119-1478 OT Time Calculation (min): 25 min  Charges: OT General Charges $OT Visit: 1 Visit OT Treatments $Self Care/Home Management : 8-22 mins  Alfonse Flavors, OTA Acute Rehabilitation Services  Office [phone]   Dewain Penning 04/21/2023, 11:49 AM

## 2023-04-21 NOTE — Progress Notes (Signed)
 [facility] Progress Note    Assessment/ Plan:   AKI on CKD3, nonoliguric Likely ATN but AIN also possible, antibiotics on hold Decision made against renal biopsy as risks outweigh benefits No immediate indication for dialysis but GFR continues to fall, potentially needed in the next day or so N.p.o. after midnight for potential catheter   Orthostatic hypotension H/o HTN -recommend compression stockings and abd binder for now -orthostats neg 2/26, repeat yesterday still positive    Edema SOB, acute on chronic dCHF exacerbation -CXR 2/27 with possible pulm edema, images personally reviewed. Possible pna however procal wnl -albumin stopped, status post Lasix 60 mg IV 2/27. Dose PRN, no need for today  Nephrotic range proteinuria -UPC 9.6g -biopsy on hold given high bleed risk -ANA, hep B surface antigen, HIV negative.  SPEP with M spike of 0.1 (immunofluorescence suspicious for monoclonal immunoglobulin versus benign), Free light chain ratio 1.87 (expected given his history of CKD). Can revisit biopsy down the road when in a more stable state -checking discrepancy between UPC and UACR--uacr ordered - UPEP--pending   RLE infection Hardware infection -s/p I&D, hardware removal 2/10, debridement 2/12 -on cefepime & dapto x 4 weeks originally.  On hold as of 2/28   Afib -per primary   DM2 -per primary   Anemia due to chronic disease, thrombocytopenia -Transfuse for Hgb<7 g/dL.    Acidosis -mild, likely related to AKI, monitor for now, start sodium bicarb if/when co2 <16  Nausea/vomiting/constipation -Doubt this is secondary to uremia.  Likely related to constipation.  Improved. Per primary service.   Subjective:    Creatinine worsened to 6.3 with a BUN of 64, K3.6 Only 0.15 L UOP reported All antibiotics are on hold He seems weak, sluggish, no complaints   Objective:   BP (!) 122/45 (BP Location: Left Arm)   Pulse 62   Temp 99.3 F (37.4 C)  (Oral)   Resp 18   Ht 6\' 1"  (1.854 m)   Wt 134.7 kg   SpO2 92%   BMI 39.18 kg/m   Intake/Output Summary (Last 24 hours) at 04/21/2023 1313 Last data filed at 04/20/2023 2100 Gross per 24 hour  Intake 361.5 ml  Output 150 ml  Net 211.5 ml   Weight change: 0.8 kg  Physical Exam: Gen: NAD, in bed CVS: RRR Resp: diminished breath sounds bibasilar, unlabored, no w/r/r/c Abd: soft, obese Ext: minimal/trace LE edema (unchanged) Neuro: awake, alert  Imaging: MR BRAIN WO CONTRAST Result Date: 04/19/2023 CLINICAL DATA:  Right facial droop and slurred speech. EXAM: MRI HEAD WITHOUT CONTRAST TECHNIQUE: Multiplanar, multiecho pulse sequences of the brain and surrounding structures were obtained without intravenous contrast. COMPARISON:  CT head without contrast 04/19/2023. FINDINGS: Brain: No acute infarct, hemorrhage, or mass lesion is present. Moderate generalized atrophy is present. Mild white matter changes are noted bilaterally. The ventricles are proportionate to the degree of atrophy. Deep brain nuclei are within normal limits. No significant extraaxial fluid collection is present. The brainstem and cerebellum are within normal limits. The internal auditory canals are within normal limits. Midline structures are within normal limits. Vascular: Flow is present in the major intracranial arteries. Skull and upper cervical spine: The craniocervical junction is normal. Upper cervical spine is within normal limits. Marrow signal is unremarkable. Sinuses/Orbits: The paranasal sinuses and mastoid air cells are clear. Bilateral lens replacements are noted. Globes and orbits are otherwise unremarkable. IMPRESSION: 1. No acute intracranial abnormality or significant interval change. 2. Moderate generalized atrophy and white matter disease  likely reflects the sequela of chronic microvascular ischemia. Electronically Signed   By: Marin Roberts M.D.   On: 04/19/2023 19:06   CT HEAD CODE STROKE WO  CONTRAST Result Date: 04/19/2023 CLINICAL DATA:  Code stroke. Acute onset of right upper extremity weakness and facial droop. Abnormal speech. EXAM: CT HEAD WITHOUT CONTRAST TECHNIQUE: Contiguous axial images were obtained from the base of the skull through the vertex without intravenous contrast. RADIATION DOSE REDUCTION: This exam was performed according to the departmental dose-optimization program which includes automated exposure control, adjustment of the mA and/or kV according to patient size and/or use of iterative reconstruction technique. COMPARISON:  CT head without contrast 01/09/2023 FINDINGS: Brain: Mild atrophy and white matter changes are similar the prior exam. No acute infarct, hemorrhage, or mass lesion is present. The ventricles are of normal size. No significant extraaxial fluid collection is present. Midline structures are within normal limits. The brainstem and cerebellum are within normal limits. Vascular: Atherosclerotic calcifications are present within the cavernous internal carotid arteries and at the dural margins of both vertebral arteries bilaterally. Skull: Calvarium is intact. No focal lytic or blastic lesions are present. No significant extracranial soft tissue lesion is present. Sinuses/Orbits: Small polyps or mucous retention cysts are present along the inferior maxillary sinuses bilaterally. No fluid levels are present. The paranasal sinuses and mastoid air cells are otherwise clear. ASPECTS [facility] Stroke Program Early CT Score) - Ganglionic level infarction (caudate, lentiform nuclei, internal capsule, insula, M1-M3 cortex): 7/7 - Supraganglionic infarction (M4-M6 cortex): 3/3 Total score (0-10 with 10 being normal): /10 IMPRESSION: 1. No acute intracranial abnormality or significant interval change. 2. Stable atrophy and white matter disease. This likely reflects the sequela of chronic microvascular ischemia. 3. Aspects is 10. The above was relayed via text pager to Dr.  Brooke Dare on 04/19/2023 at 13:39 . Electronically Signed   By: Marin Roberts M.D.   On: 04/19/2023 13:39    Labs: BMET Recent Labs  Lab 04/15/23 0357 04/16/23 0752 04/17/23 1005 04/18/23 0542 04/19/23 0531 04/20/23 0619 04/21/23 1042  NA 137 140 142 137 133* 134* 131*  K 3.7 3.5 4.5 3.4* 3.0* 3.5 3.6  CL 108 107 108 103 100 98 97*  CO2 21* 24 23 19* 17* 17* 19*  GLUCOSE 192* 143* 124* 147* 172* 214* 185*  BUN 36* 36* 33* 36* 39* 48* 64*  CREATININE 3.04* 3.20* 3.21* 3.38* 3.56* 4.62* 6.34*  CALCIUM 8.4* 8.9 9.3 9.1 9.0 9.2 8.5*  PHOS  --   --  3.3 3.3 3.2 3.8 5.0*   CBC Recent Labs  Lab 04/15/23 1452 04/16/23 0752 04/18/23 0542 04/19/23 0531 04/20/23 0619 04/21/23 0525  WBC 8.0   < > 10.2 14.1* 12.1* 8.7  NEUTROABS 6.0  --   --   --   --   --   HGB 9.2*   < > 8.3* 8.1* 7.4* 7.7*  HCT 27.0*   < > 24.3* 23.8* 22.0* 22.6*  MCV 86.3   < > 88.4 87.8 88.0 87.3  PLT 168   < > 117* 134* 143* 123*   < > = values in this interval not displayed.    Medications:     allopurinol  100 mg Oral Daily   amiodarone  200 mg Oral BID   carvedilol  3.125 mg Oral BID WC   Chlorhexidine Gluconate Cloth  6 each Topical Q0600   clopidogrel  75 mg Oral Q breakfast   docusate sodium  100 mg Oral BID  DULoxetine  60 mg Oral Daily   ezetimibe  10 mg Oral Daily   gabapentin  300 mg Oral BID   guaiFENesin  600 mg Oral BID   insulin aspart  0-9 Units Subcutaneous Q6H   insulin glargine  40 Units Subcutaneous Daily   levothyroxine  88 mcg Oral QAC breakfast   polyethylene glycol  17 g Oral Daily   sodium chloride flush  3 mL Intravenous Q12H   sodium chloride flush  3-10 mL Intravenous Q12H   tamsulosin  0.4 mg Oral Daily   [name], MD  [facility] 04/21/2023, 1:13 PM

## 2023-04-21 NOTE — Plan of Care (Signed)
  Problem: Nutrition: Goal: Adequate nutrition will be maintained Outcome: Progressing   Problem: Health Behavior/Discharge Planning: Goal: Ability to manage health-related needs will improve Outcome: Not Progressing   Problem: Coping: Goal: Level of anxiety will decrease Outcome: Completed/Met   Problem: Elimination: Goal: Will not experience complications related to urinary retention Outcome: Completed/Met

## 2023-04-22 ENCOUNTER — Other Ambulatory Visit (HOSPITAL_COMMUNITY): Payer: Self-pay

## 2023-04-22 ENCOUNTER — Inpatient Hospital Stay (HOSPITAL_COMMUNITY)

## 2023-04-22 DIAGNOSIS — N179 Acute kidney failure, unspecified: Secondary | ICD-10-CM | POA: Diagnosis not present

## 2023-04-22 DIAGNOSIS — L02415 Cutaneous abscess of right lower limb: Secondary | ICD-10-CM | POA: Diagnosis not present

## 2023-04-22 DIAGNOSIS — J101 Influenza due to other identified influenza virus with other respiratory manifestations: Secondary | ICD-10-CM | POA: Diagnosis not present

## 2023-04-22 DIAGNOSIS — L97314 Non-pressure chronic ulcer of right ankle with necrosis of bone: Secondary | ICD-10-CM | POA: Diagnosis not present

## 2023-04-22 HISTORY — PX: IR FLUORO GUIDE CV LINE LEFT: IMG2282

## 2023-04-22 HISTORY — PX: IR US GUIDE VASC ACCESS LEFT: IMG2389

## 2023-04-22 LAB — CBC
HCT: 22.9 % — ABNORMAL LOW (ref 39.0–52.0)
Hemoglobin: 7.8 g/dL — ABNORMAL LOW (ref 13.0–17.0)
MCH: 29.4 pg (ref 26.0–34.0)
MCHC: 34.1 g/dL (ref 30.0–36.0)
MCV: 86.4 fL (ref 80.0–100.0)
Platelets: 132 10*3/uL — ABNORMAL LOW (ref 150–400)
RBC: 2.65 MIL/uL — ABNORMAL LOW (ref 4.22–5.81)
RDW: 16.2 % — ABNORMAL HIGH (ref 11.5–15.5)
WBC: 6.7 10*3/uL (ref 4.0–10.5)
nRBC: 0 % (ref 0.0–0.2)

## 2023-04-22 LAB — RENAL FUNCTION PANEL
Albumin: 2.4 g/dL — ABNORMAL LOW (ref 3.5–5.0)
Anion gap: 17 — ABNORMAL HIGH (ref 5–15)
BUN: 77 mg/dL — ABNORMAL HIGH (ref 8–23)
CO2: 18 mmol/L — ABNORMAL LOW (ref 22–32)
Calcium: 8.8 mg/dL — ABNORMAL LOW (ref 8.9–10.3)
Chloride: 96 mmol/L — ABNORMAL LOW (ref 98–111)
Creatinine, Ser: 7.26 mg/dL — ABNORMAL HIGH (ref 0.61–1.24)
GFR, Estimated: 8 mL/min — ABNORMAL LOW (ref >60)
Glucose, Bld: 178 mg/dL — ABNORMAL HIGH (ref 70–99)
Phosphorus: 5.3 mg/dL — ABNORMAL HIGH (ref 2.5–4.6)
Potassium: 3.6 mmol/L (ref 3.5–5.1)
Sodium: 131 mmol/L — ABNORMAL LOW (ref 135–145)

## 2023-04-22 LAB — HEPARIN LEVEL (UNFRACTIONATED): Heparin Unfractionated: 0.42 [IU]/mL (ref 0.30–0.70)

## 2023-04-22 LAB — GLUCOSE, CAPILLARY
Glucose-Capillary: 185 mg/dL — ABNORMAL HIGH (ref 70–99)
Glucose-Capillary: 180 mg/dL — ABNORMAL HIGH (ref 70–99)
Glucose-Capillary: 177 mg/dL — ABNORMAL HIGH (ref 70–99)

## 2023-04-22 LAB — MAGNESIUM: Magnesium: 2 mg/dL (ref 1.7–2.4)

## 2023-04-22 LAB — APTT: aPTT: 40 s — ABNORMAL HIGH (ref 24–36)

## 2023-04-22 LAB — HEPATITIS B SURFACE ANTIGEN: Hepatitis B Surface Ag: NONREACTIVE

## 2023-04-22 MED ORDER — HEPARIN SODIUM (PORCINE) 1000 UNIT/ML IJ SOLN
INTRAMUSCULAR | Status: AC
  Filled 2023-04-22: qty 10

## 2023-04-22 MED ORDER — FENTANYL CITRATE (PF) 100 MCG/2ML IJ SOLN
INTRAMUSCULAR | Status: AC | PRN
  Administered 2023-04-22 (×2): 25 ug via INTRAVENOUS

## 2023-04-22 MED ORDER — VANCOMYCIN HCL IN DEXTROSE 1-5 GM/200ML-% IV SOLN
1000.0000 mg | INTRAVENOUS | Status: AC
  Administered 2023-04-22: 1000 mg via INTRAVENOUS
  Filled 2023-04-22: qty 200

## 2023-04-22 MED ORDER — HEPARIN SODIUM (PORCINE) 1000 UNIT/ML IJ SOLN
4200.0000 [IU] | Freq: Once | INTRAMUSCULAR | Status: AC
Start: 2023-04-22 — End: 2023-04-22
  Filled 2023-04-22: qty 4.2

## 2023-04-22 MED ORDER — LIDOCAINE-EPINEPHRINE 1 %-1:100000 IJ SOLN
INTRAMUSCULAR | Status: AC
  Filled 2023-04-22: qty 1

## 2023-04-22 MED ORDER — FENTANYL CITRATE (PF) 100 MCG/2ML IJ SOLN
INTRAMUSCULAR | Status: AC
  Filled 2023-04-22: qty 2

## 2023-04-22 MED ORDER — LIDOCAINE-EPINEPHRINE 1 %-1:100000 IJ SOLN
20.0000 mL | Freq: Once | INTRAMUSCULAR | Status: AC
  Administered 2023-04-22: 20 mL
  Filled 2023-04-22 (×2): qty 20

## 2023-04-22 MED ORDER — MIDAZOLAM HCL 2 MG/2ML IJ SOLN
INTRAMUSCULAR | Status: AC | PRN
  Administered 2023-04-22: 1 mg via INTRAVENOUS

## 2023-04-22 MED ORDER — VANCOMYCIN HCL IN DEXTROSE 1-5 GM/200ML-% IV SOLN
INTRAVENOUS | Status: AC
  Filled 2023-04-22: qty 200

## 2023-04-22 MED ORDER — MIDAZOLAM HCL 2 MG/2ML IJ SOLN
INTRAMUSCULAR | Status: AC
Start: 2023-04-22 — End: ?
  Filled 2023-04-22: qty 2

## 2023-04-22 NOTE — Progress Notes (Signed)
 [facility]                                                                              Harold [name], is a 68 y.o. male, DOB - 10-Oct-1955, AVW:[id number] Admit date - 03/27/2023    Outpatient Primary MD for the patient is Harold Northern, MD  LOS - 26  days  Chief Complaint  Patient presents with   Cough       Brief summary   Patient is a 68 year old male with morbid obesity, OSA, DM2, HTN, HLD, CAD/stent, A-fib on Eliquis, carotid artery stenosis, CKD, chronic anemia, anxiety/depression, diabetic neuropathy, GERD, hypothyroidism, COPD, pulmonary hypertension. 2/6, patient presented to the ED with complaint of cough, subjective fever, myalgia, generalized weakness for 5 days. Respiratory virus panel positive for influenza A. Chest x-ray negative for infiltrates. Also noted to have right leg cellulitis.  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 -S/p right ankle hardware removal, I&D and wound VAC placement on 2/10 by Dr. Jena Gauss -S/p repeat debridement of the right ankle by Dr. Lajoyce Corners 04/02/2023.   -ID was consulted, initially on cefepime, subsequently on daptomycin and ertapenem, now antibiotics held due to worsening renal function.     AKI on CKD stage IIIb:  Nephrotic range proteinuria, uremia, Anion gap metabolic acidosis -Baseline creatinine appears to be 1.9-2.2.   -Renal function has continued to progressively worsen -Nephrology following, decision increased renal biopsy due to high risk of bleeding and patient on Plavix with recent stent for PAD. -Creatinine 7.26 -IR consulted for [facility] and HD after placement   Right facial droop -On 3/1 patient noted to have right facial droop, evaluated by neurology, MRI of the brain negative for acute stroke. -Currently on Plavix.  PAD -Seen by vascular  surgery underwent aortogram, right leg angiogram, successful stenting of right superficial femoral artery on 04/01/2023.   -Continue Plavix, Zetia  Acute metabolic encephalopathy -Likely multifactorial, uremia, infection, drug-induced from possibility of cefepime induced encephalopathy, chronic worsening memory issues -Plan for starting HD Continue Cymbalta, decreased Neurontin   Type 2 diabetes mellitus / Diabetic neuropathy, POA: A1c 7 on 01/02/2023. PTA meds-Tresiba 60 units daily, Mounjaro  CBG (last 3)  Recent Labs    04/21/23 2321 04/22/23 0623 04/22/23 1157  GLUCAP 172* 185* 180*   -Continue Semglee, SSI   Diabetic neuropathy -Gabapentin decreased due to worsening renal function. (PTA reportedly on gabapentin 1600 mg 3 times daily)   Hypokalemia/hypomagnesemia: Improved   Paroxysmal A-fib -HR controlled, continue Coreg, amiodarone  -CHADS-VASc of 4 -Eliquis held secondary to bleeding from CVL recently -Heparin drip now held   Essential hypertension -BP stable, continue low-dose Coreg   Anemia of chronic disease: Baseline hemoglobin between 10.5 11.5.   -Hemoglobin 7.8 -No active bleeding currently holding anticoagulation  CAD/stent Carotid artery stenosis HLD Continue PTA meds -Plavix, Crestor -Eliquis held   Hypothyroidism Continue Synthroid   Obstructive sleep apnea CPAP at bedtime    Gout Continue allopurinol, colchicine    Orthostatic hypotension -Received brief IV fluids, continue abdominal  binder -2D echo showed EF 55 to 60%, basal inferior hypokinesis, G2 DD    Generalized debility -PT had recommended SNF however patient will have to pay out-of-pocket not able to afford.  Acute diarrhea Resolved   Influenza A:  - Presented with 5 days of cough, fever, myalgia.  - Respiratory virus panel positive for influenza A. Chest x-ray without acute infiltrate -Improved with symptomatic treatment, was outside 48-hour window for Tamiflu hence was  not initiated.   Moderate obesity, class II Estimated body mass index is 39.41 kg/m as calculated from the following:   Height as of this encounter: 6\' 1"  (1.854 m).   Weight as of this encounter: 135.5 kg.  Code Status: Full code DVT Prophylaxis:  Place and maintain sequential compression device Start: 04/15/23 1549 SCDs Start: 04/02/23 1617   Level of Care: Level of care: Progressive Cardiac Family Communication:  Disposition Plan:      Remains inpatient appropriate:      Procedures:  2D echo  Consultants:   Renal Ortho ID   Antimicrobials:   Anti-infectives (From admission, onward)    Start     Dose/Rate Route Frequency Ordered Stop   04/22/23 1300  vancomycin (VANCOCIN) IVPB 1000 mg/200 mL premix        1,000 mg 200 mL/hr over 60 Minutes Intravenous To Radiology 04/22/23 1110 04/22/23 1410   04/19/23 1000  ertapenem (INVANZ) 500 mg in sodium chloride 0.9 % 50 mL IVPB  Status:  Discontinued        500 mg 100 mL/hr over 30 Minutes Intravenous Daily 04/18/23 0931 04/18/23 1106   04/17/23 1800  ertapenem (INVANZ) 1 g in sodium chloride 0.9 % 100 mL IVPB  Status:  Discontinued        1 g 200 mL/hr over 30 Minutes Intravenous Daily 04/17/23 1354 04/18/23 0931   04/08/23 0000  daptomycin (CUBICIN) IVPB  Status:  Discontinued        700 mg Intravenous Every 24 hours 04/08/23 1147 04/08/23    04/08/23 0000  ceFEPime (MAXIPIME) IVPB  Status:  Discontinued        2 g Intravenous Every 12 hours 04/08/23 1147 04/18/23    04/08/23 0000  daptomycin (CUBICIN) IVPB  Status:  Discontinued        700 mg Intravenous Every 24 hours 04/08/23 1150 04/18/23    04/04/23 1400  DAPTOmycin (CUBICIN) IVPB 700 mg/153mL premix  Status:  Discontinued        700 mg 200 mL/hr over 30 Minutes Intravenous Daily 04/03/23 1435 04/18/23 1106   04/02/23 1715  ceFAZolin (ANCEF) IVPB 2g/100 mL premix  Status:  Discontinued        2 g 200 mL/hr over 30 Minutes Intravenous Every 8 hours 04/02/23 1616  04/02/23 1623   04/02/23 0600  ceFAZolin (ANCEF) IVPB 3g/150 mL premix  Status:  Discontinued        3 g 300 mL/hr over 30 Minutes Intravenous On call to O.R. 04/01/23 1700 04/02/23 1549   03/31/23 1015  ceFAZolin (ANCEF) IVPB 2g/100 mL premix        2 g 200 mL/hr over 30 Minutes Intravenous On call to O.R. 03/31/23 0921 03/31/23 1203   03/29/23 2200  vancomycin (VANCOREADY) IVPB 2000 mg/400 mL  Status:  Discontinued       Placed in "Followed by" Linked Group   2,000 mg 200 mL/hr over 120 Minutes Intravenous Every 48 hours 03/27/23 2257 04/03/23 1435   03/28/23 1045  metroNIDAZOLE (FLAGYL) tablet 500  mg  Status:  Discontinued        500 mg Oral Every 12 hours 03/28/23 0955 04/03/23 0945   03/27/23 2330  vancomycin (VANCOCIN) 2,500 mg in sodium chloride 0.9 % 500 mL IVPB       Placed in "Followed by" Linked Group   2,500 mg 262.5 mL/hr over 120 Minutes Intravenous  Once 03/27/23 2257 03/28/23 0241   03/27/23 2300  metroNIDAZOLE (FLAGYL) IVPB 500 mg  Status:  Discontinued        500 mg 100 mL/hr over 60 Minutes Intravenous Every 12 hours 03/27/23 2247 03/28/23 0955   03/27/23 2300  ceFEPIme (MAXIPIME) 2 g in sodium chloride 0.9 % 100 mL IVPB  Status:  Discontinued        2 g 200 mL/hr over 30 Minutes Intravenous Every 12 hours 03/27/23 2257 04/17/23 1354   03/27/23 2100  ceFAZolin (ANCEF) IVPB 2g/100 mL premix        2 g 200 mL/hr over 30 Minutes Intravenous  Once 03/27/23 2058 03/27/23 2219          Medications  allopurinol  50 mg Oral Daily   amiodarone  200 mg Oral BID   carvedilol  3.125 mg Oral BID WC   Chlorhexidine Gluconate Cloth  6 each Topical Q0600   clopidogrel  75 mg Oral Q breakfast   docusate sodium  100 mg Oral BID   DULoxetine  60 mg Oral Daily   ezetimibe  10 mg Oral Daily   gabapentin  300 mg Oral QHS   guaiFENesin  600 mg Oral BID   insulin aspart  0-9 Units Subcutaneous Q6H   insulin glargine  40 Units Subcutaneous Daily   levothyroxine  88 mcg Oral  QAC breakfast   polyethylene glycol  17 g Oral Daily   sodium chloride flush  3 mL Intravenous Q12H   sodium chloride flush  3-10 mL Intravenous Q12H   tamsulosin  0.4 mg Oral Daily      Subjective:   Harold [name] was seen and examined today.  Somnolent although arousable, denies any specific complaints.  No acute events overnight  Objective:   Vitals:   04/22/23 1159 04/22/23 1359 04/22/23 1415 04/22/23 1420  BP: 123/63 129/61 134/85 138/68  Pulse: (!) 57 (!) 59 (!) 58 (!) 58  Resp: 16  (!) 24 (!) 22  Temp: 99 F (37.2 C)     TempSrc: Oral     SpO2: 100% 100% 96% 100%  Weight:      Height:       No intake or output data in the 24 hours ending 04/22/23 1427    Wt Readings from Last 3 Encounters:  04/22/23 135.5 kg  01/28/23 (!) 142.9 kg  01/09/23 (!) 145 kg   Physical Exam General: Somnolent but easily arousable and oriented.  Ill-appearing Cardiovascular: S1 S2 clear, RRR.  Respiratory: CTAB, no wheezing Gastrointestinal: Soft, nontender, nondistended, NBS Ext: RLE in boot Neuro: no new deficits Psych: somnolent      Data Reviewed:  I have personally reviewed following labs    CBC Lab Results  Component Value Date   WBC 6.7 04/22/2023   RBC 2.65 (L) 04/22/2023   HGB 7.8 (L) 04/22/2023   HCT 22.9 (L) 04/22/2023   MCV 86.4 04/22/2023   MCH 29.4 04/22/2023   PLT 132 (L) 04/22/2023   MCHC 34.1 04/22/2023   RDW 16.2 (H) 04/22/2023   LYMPHSABS 0.9 04/15/2023   MONOABS 0.6 04/15/2023   EOSABS 0.3  04/15/2023   BASOSABS 0.1 04/15/2023     Last metabolic panel Lab Results  Component Value Date   NA 131 (L) 04/22/2023   K 3.6 04/22/2023   CL 96 (L) 04/22/2023   CO2 18 (L) 04/22/2023   BUN 77 (H) 04/22/2023   CREATININE 7.26 (H) 04/22/2023   GLUCOSE 178 (H) 04/22/2023   GFRNONAA 8 (L) 04/22/2023   GFRAA 47 (L) 09/28/2019   CALCIUM 8.8 (L) 04/22/2023   PHOS 5.3 (H) 04/22/2023   PROT 5.8 (L) 04/16/2023   ALBUMIN 2.4 (L) 04/22/2023    LABGLOB 2.2 04/16/2023   AGRATIO 1.7 04/16/2023   BILITOT 0.6 04/16/2023   ALKPHOS 73 04/16/2023   AST 50 (H) 04/16/2023   ALT 42 04/16/2023   ANIONGAP 17 (H) 04/22/2023    CBG (last 3)  Recent Labs    04/21/23 2321 04/22/23 0623 04/22/23 1157  GLUCAP 172* 185* 180*      Coagulation Profile: No results for input(s): "INR", "PROTIME" in the last 168 hours.   Radiology Studies: I have personally reviewed the imaging studies  No results found.      Thad Ranger M.D. [facility] 04/22/2023, 2:27 PM  Available via Epic secure chat 7am-7pm After 7 pm, please refer to night coverage provider listed on amion.

## 2023-04-22 NOTE — Procedures (Signed)
 Interventional Radiology Procedure:   Indications: Acute kidney injury  Procedure: Tunneled dialysis catheter placement  Findings: Left jugular Palindrome, 28 cm tip to cuff.  Tip at SVC/RA junction.  Complications: None     EBL: Minimal  Plan: Tunneled dialysis catheter is ready to use.   Harold [name] R. Lowella Dandy, MD  Pager: [phone]

## 2023-04-22 NOTE — Consult Note (Signed)
 Chief Complaint: Renal failure needing dialysis  Referring Provider(s): Sabra Heck  Supervising Physician: Gilmer Mor  Patient Status: [facility] - In-pt  History of Present Illness: Harold [name] is a 68 y.o. male with morbid obesity, OSA, DM2, HTN, HLD, CAD/stent, A-fib on Eliquis, carotid artery stenosis, CKD, chronic anemia, anxiety/depression, diabetic neuropathy, GERD, hypothyroidism, COPD, and pulmonary hypertension.  He presented to ED on 2/6 with cough, subjective fever, myalgia, generalized weakness for 5 days.  Respiratory virus panel positive for influenza A.   Subsequent evaluation showed right leg wound infection/osteomyelitis.   He had a previous right ankle fracture with hardware fixation.  He underwent surgery and hardware removal.  Surgical culture grew Enterobacter cloacae and vancomycin-resistant Staph epidermidis.   He ha been on broad-spectrum antibiotics and developed AKI with concern for AIN.    Antibiotics discontinued after discussion between nephrology and ID.   Dr. Marisue Humble note suggests ATN, AIN possible.  We are asked to place a tunneled hemodialysis catheter.  He is NPO. He has a tunneled CV line on the right placed by Dr. Lowella Dandy on 04/07/23.  Patient is Full Code  Past Medical History:  Diagnosis Date   Anemia    Anginal pain (HCC) 04/2013   Cardiac cath showed patent stents with distal LAD, circumflex-OM and RCA disease in small vessels.   Anxiety    Atrial fibrillation (HCC) 12/2021   CAD S/P percutaneous coronary angioplasty 2003, 04/2012   status post PCI to LAD, circumflex-OM 2, RCA   Carotid artery occlusion    Chronic renal insufficiency, stage II (mild)    Chronic venous insufficiency    varicosities, no reflux; dopplers 04/14/12- valvular insufficiency in the R and L GSV   Complication of anesthesia    COPD, mild (HCC)    Depression    situaltional    Diabetes mellitus    Diabetic neuropathy (HCC) 08/24/2019    Dyslipidemia associated with type 2 diabetes mellitus (HCC)    GERD (gastroesophageal reflux disease)    History of cardioversion 12/2021   at [facility]   HTN (hypertension)    Hypothyroidism    Neuromuscular disorder (HCC)    neuropathy in feet   Neuropathy    notably improved following PCI with improved cardiac function   Obesity    Obesity, Class II, BMI 35.0-39.9, with comorbidity (see actual BMI)    BMI 39; wgt loss efforts in place; seeing Dietician   Orthostatic hypotension    PONV (postoperative nausea and vomiting)    "[employer]"   Pulmonary hypertension (HCC) 04/2012   PA pressure   Sleep apnea    uses nightly   Vitamin D deficiency    per facility notes    Past Surgical History:  Procedure Laterality Date   ABDOMINAL AORTAGRAM N/A 11/15/2011   Procedure: ABDOMINAL Ronny Flurry;  Surgeon: Sherren Kerns, MD;  Location: [facility] CATH LAB;  Service: Cardiovascular;  [name];   ABDOMINAL AORTOGRAM W/LOWER EXTREMITY N/A 10/13/2019   Procedure: ABDOMINAL AORTOGRAM W/LOWER EXTREMITY;  Surgeon: Iran Ouch, MD;  Location: [facility] INVASIVE CV LAB;  Service: CV: Non-obst Ao-Iliac. R SFA-PopA patent with significant calcific stenosis of TP trunk-prox PTA (dominant) & CTO ATA  -> R PTA-TP PTCA   ABDOMINAL AORTOGRAM W/LOWER EXTREMITY N/A 11/06/2022   Procedure: ABDOMINAL AORTOGRAM W/LOWER EXTREMITY;  Surgeon: Iran Ouch, MD;  Location: [facility] INVASIVE CV LAB;  Service: Cardiovascular;  [name];   ABDOMINAL AORTOGRAM W/LOWER EXTREMITY Right 04/01/2023   Procedure:  ABDOMINAL AORTOGRAM W/LOWER EXTREMITY;  Surgeon: Nada Libman, MD;  Location: [facility] INVASIVE CV LAB;  Service: Cardiovascular;  Laterality: Right;   ABIs  04/27/2012   mild bilateral arterial insufficiency   APPLICATION OF WOUND VAC Right 03/31/2023   Procedure: APPLICATION OF WOUND VAC;  Surgeon: Roby Lofts, MD;  [name];  Service: Orthopedics;  Laterality: Right;   BACK SURGERY  2005 x1    X2-2010   BUNIONECTOMY Right 11/11/2013   Procedure: RIGHT FOOT SILVER BUNIONECTOMY;  Surgeon: Toni Arthurs, MD;  Location: [facility];  Service: Orthopedics;  Laterality: Right;   CARDIAC CATHETERIZATION  12/11/2001   significant 3V CAD, normal LV function   CARDIOVERSION N/A 03/13/2022   Procedure: CARDIOVERSION;  Surgeon: Little Ishikawa, MD;  Location: [facility] ENDOSCOPY;  Service: Cardiovascular;  [name];   CARDIOVERSION N/A 07/25/2022   Procedure: CARDIOVERSION;  Surgeon: Jake Bathe, MD;  [facility]: [facility] INVAS[facility] CV LAB;  Service: Cardiovascular;  [name];   Carotid Doppler  04/27/2012   right internal carotid: Elevated velocities but no evidence of plaque. Left internal carotid 40-59%   CAROTID ENDARTERECTOMY  2005   Right; recent carotid Dopplers notes elevated velocities.    colonscopy     CORONARY ANGIOPLASTY  12/21/2001   PTCA of the distal and mid AV groove circ, unsuccessful PTCA of second OM total occlusion, unsuccessful PTCA of the apical LAD total occlusion   CORONARY ANGIOPLASTY WITH STENT PLACEMENT  04/29/2012   PCI to 3 RCA lesions, Promus Premiere 2.73mmx8mm distally, mid was 2.57mx28mm and proximally 2.75x45mm, EF 55-60%   CORONARY STENT PLACEMENT  04/28/2012   PCI to LAD (3x69mm Xience DES postdilated to 3.25) and circ prox and mid (2 overlappinmg 2.75mmx12mmXience DES postdilated to 2.40mm)   DOPPLER ECHOCARDIOGRAPHY  04/28/2012   poor quality study: EF estimated 60-65%; unable to assess diastolic function (previously noted to have diastolic dysfunction); severely dilated left atrium and mild right atrium; dilated IVC consistent with increased central venous pressure.Marland Kitchen   HARDWARE REMOVAL Right 03/31/2023   Procedure: HARDWARE REMOVAL RIGHT ANKLE;  Surgeon: Roby Lofts, MD;  [name];  Service: Orthopedics;  Laterality: Right;   HARDWARE REMOVAL Right 01/28/2023   Procedure: REMOVAL OF HARDWARE RIGHT ANKLE;  Surgeon: [name], MD;  [name];  Service: Orthopedics;  Laterality: Right;   HERNIA REPAIR     I & D EXTREMITY Right 01/02/2023   Procedure: IRRIGATION AND DEBRIDEMENT RIGHT ANKLE;  Surgeon: [name], MD;  [name];  Service: Orthopedics;  Laterality: Right;   I & D EXTREMITY Right 04/02/2023   Procedure: DEBRIDEMENT RIGHT ANKLE;  Surgeon: Nadara Mustard, MD;  Location: [facility] OR;  Service: Orthopedics;  Laterality: Right;   IR FLUORO GUIDE CV LINE RIGHT  04/07/2023   IR PATIENT EVAL TECH 0-60 MINS  04/07/2023   IR RADIOLOGIST EVAL & MGMT  04/08/2023   IR US GUIDE VASC ACCESS RIGHT  04/07/2023   LEFT AND RIGHT HEART CATHETERIZATION WITH CORONARY ANGIOGRAM N/A 04/27/2012   Procedure: LEFT AND RIGHT HEART CATHETERIZATION WITH CORONARY ANGIOGRAM;  Surgeon: Marykay Lex, MD;  Location: [facility] CATH LAB;  Service: Cardiovascular;  [name];   LEFT AND RIGHT HEART CATHETERIZATION WITH CORONARY ANGIOGRAM N/A 05/14/2013   Procedure: LEFT AND RIGHT HEART CATHETERIZATION WITH CORONARY ANGIOGRAM;  Surgeon: Lennette Bihari, MD;  [name]; PCWP ;; multivessel CAD with widely patent mid LAD  stents and 90% apical LAD, [name], Patent RCA ostial mid and distal stents - 70% distal runoff Dz   LUMBAR LAMINECTOMY/DECOMPRESSION MICRODISCECTOMY  01/07/2012   Procedure: LUMBAR LAMINECTOMY/DECOMPRESSION MICRODISCECTOMY 1 LEVEL;  Surgeon: Carmela Hurt, MD;  [name];  Service: Neurosurgery;  Laterality: Bilateral;   Lumbar Three-Four Decompression   LUMBAR LAMINECTOMY/DECOMPRESSION MICRODISCECTOMY N/A 07/26/2020   Procedure: Lumbar two-three Laminectomy/Foraminotomy;  Surgeon: Coletta Memos, MD;  [name];  Service: Neurosurgery;  [name];   ORIF ANKLE FRACTURE Right 01/02/2023   Procedure: OPEN REDUCTION INTERNAL FIXATION (ORIF)RIGHT ANKLE FRACTURE;  Surgeon: [name], MD;  Location:  [facility] OR;  Service: Orthopedics;  Laterality: Right;   PERCUTANEOUS CORONARY STENT INTERVENTION (PCI-S) N/A 04/28/2012   Procedure: PERCUTANEOUS CORONARY STENT INTERVENTION (PCI-S);  Surgeon: Lennette Bihari, MD;  Location: [facility] CATH LAB;  Service: Cardiovascular;  [name];   PERCUTANEOUS CORONARY STENT INTERVENTION (PCI-S) N/A 04/29/2012   Procedure: PERCUTANEOUS CORONARY STENT INTERVENTION (PCI-S);  Surgeon: Marykay Lex, MD;  Location: [facility] CATH LAB;  Service: Cardiovascular;  [name];   PERIPHERAL VASCULAR ATHERECTOMY  11/06/2022   Procedure: PERIPHERAL VASCULAR ATHERECTOMY;  Surgeon: Iran Ouch, MD;  [facility];  Service: Cardiovascular;;   PERIPHERAL VASCULAR BALLOON ANGIOPLASTY Right 10/13/2019   Procedure: PERIPHERAL VASCULAR BALLOON ANGIOPLASTY;  Surgeon: Iran Ouch, MD;  [facility] LAB;  Service: Cardiovascular;  Laterality: Right;  PTCA of R TP Trunk into PTA (Drug-coated) => Follow-up ABIs 0.9 on the right and 0.99 on the left.   PERIPHERAL VASCULAR BALLOON ANGIOPLASTY Right 04/01/2023   Procedure: PERIPHERAL VASCULAR BALLOON ANGIOPLASTY;  Surgeon: Nada Libman, MD;  [facility] LAB;  Service: Cardiovascular;  Laterality: Right;   PERIPHERAL VASCULAR INTERVENTION Right 04/01/2023   Procedure: PERIPHERAL VASCULAR INTERVENTION;  Surgeon: Nada Libman, MD;  [facility]AS[facility] CV LAB;  Service: Cardiovascular;  Laterality: Right;   SPINE SURGERY     UMBILICAL HERNIA REPAIR  2009   steel mesh insert    Allergies: Doxycycline, Septra [bactrim], Penicillins, Metformin and related, Other, Sulfamethoxazole-trimethoprim, and Wellbutrin [bupropion]  Medications: Prior to Admission medications   Medication Sig Start Date End Date Taking? Authorizing Provider  acetaminophen (TYLENOL) 500 MG tablet Take 1 tablet (500 mg total) by mouth every 6 (six) hours as needed for mild pain (pain score 1-3). 01/07/23  Yes  Hongalgi, Maximino Greenland, MD  allopurinol (ZYLOPRIM) 100 MG tablet Take 1 tablet (100 mg total) by mouth daily. Patient taking differently: Take 100 mg by mouth 2 (two) times daily. 01/07/23  Yes Hongalgi, Maximino Greenland, MD  amiodarone (PACERONE) 200 MG tablet Take 1 tablet (200 mg total) by mouth daily. Patient taking differently: Take 200 mg by mouth 2 (two) times daily. 07/11/22  Yes Fenton, Clint R, PA  apixaban (ELIQUIS) 5 MG TABS tablet TAKE 1 TABLET BY MOUTH TWICE A DAY 02/17/23  Yes Marykay Lex, MD  carvedilol (COREG) 3.125 MG tablet TAKE 1 TABLET BY MOUTH TWICE A DAY WITH A MEAL 12/13/22  Yes Dunn, Dayna N, PA-C  clopidogrel (PLAVIX) 75 MG tablet Take 75 mg by mouth every morning. 02/04/23  Yes [provider]  colchicine 0.6 MG tablet Take 0.6 mg by mouth daily.   Yes [provider]  Continuous Blood Gluc Sensor (FREESTYLE LIBRE 14 DAY SENSOR) MISC APPLY 1 SENSOR EVERY 14 DAYS 08/25/17  Yes [provider]  DULoxetine (CYMBALTA) 60 MG  capsule Take 60 mg by mouth daily.   Yes [provider]  ezetimibe (ZETIA) 10 MG tablet Take 10 mg by mouth daily. 05/09/22  Yes [provider]  furosemide (LASIX) 80 MG tablet Take 0.5 tablets (40 mg total) by mouth daily. May take additional tablet for weight gain of 3 pounds in one day or five pounds in one week Patient taking differently: Take 160 mg by mouth 2 (two) times daily. May take additional tablet for weight gain of 3 pounds in one day or five pounds in one week 01/14/23  Yes [place], Eric J, DO  gabapentin (NEURONTIN) 800 MG tablet Take 1,600 mg by mouth 3 (three) times daily. 02/13/23  Yes [provider]  HUMULIN R U-500 KWIKPEN 500 UNIT/ML KwikPen Inject 90-180 Units into the skin in the morning and at bedtime. 02/18/23  Yes [provider]  levothyroxine (SYNTHROID, LEVOTHROID) 88 MCG tablet Take 88 mcg by mouth daily before breakfast.   Yes [provider]  nitroGLYCERIN  (NITROSTAT) 0.4 MG SL tablet PLACE 1 TAB UNDER THE TONGUE EVERY 5 MINUTES AS NEEDED FOR CHEST PAIN (SEVERE PRESSURE OR TIGHTNESS) 02/22/19  Yes Marykay Lex, MD  pantoprazole (PROTONIX) 40 MG tablet TAKE 1 TABLET BY MOUTH EVERY DAY 12/13/22  Yes Dunn, Dayna N, PA-C  potassium chloride (MICRO-K) 10 MEQ CR capsule Take 10 mEq by mouth daily. 02/26/23  Yes [provider]  rosuvastatin (CRESTOR) 20 MG tablet Take 20 mg by mouth every evening.   Yes [provider]  SODIUM FLUORIDE 5000 PPM 1.1 % PSTE Take 1 Application by mouth in the morning and at bedtime. 02/17/23  Yes [provider]  tirzepatide Greggory Keen) 15 MG/0.5ML Pen Inject 15 mg into the skin once a week. Patient taking differently: Inject 15 mg into the skin once a week. On Tuesdays 12/30/22  Yes   traZODone (DESYREL) 100 MG tablet Take 100 mg by mouth at bedtime. 03/04/23  Yes [provider]  TRESIBA FLEXTOUCH 200 UNIT/ML FlexTouch Pen Inject 60 Units into the skin daily. 03/22/23  Yes [provider]  amLODipine (NORVASC) 5 MG tablet Take 1 tablet (5 mg total) by mouth daily. 04/11/23   Jerald Kief, MD  B-D ULTRAFINE III SHORT PEN 31G X 8 MM MISC Inject 1 each into the skin 3 (three) times daily. 11/30/19   [provider]  benzonatate (TESSALON) 200 MG capsule Take 1 capsule (200 mg total) by mouth 3 (three) times daily as needed for cough. 04/10/23   Jerald Kief, MD  docusate sodium (COLACE) 100 MG capsule Take 1 capsule (100 mg total) by mouth 2 (two) times daily. 04/10/23 05/10/23  Jerald Kief, MD  gabapentin (NEURONTIN) 400 MG capsule Take 1 capsule (400 mg total) by mouth 3 (three) times daily. 04/10/23 05/10/23  Jerald Kief, MD  loratadine (CLARITIN) 10 MG tablet Take 1 tablet (10 mg total) by mouth daily. 01/15/23   [place], Alvira Philips, DO  Multiple Vitamin (MULTIVITAMIN WITH MINERALS) TABS tablet Take 1 tablet by mouth daily. 01/08/23   Hongalgi, Maximino Greenland, MD  PRESCRIPTION  MEDICATION Pt uses CPAP Machine at bedtime    [provider]     Family History  Adopted: Yes  Family history unknown: Yes    Social History   Socioeconomic History   Marital status: Married    Spouse name: Not on file   Number of children: 0   Years of education: hs   Highest education  level: Not on file  Occupational History   Occupation: Surveyor, quantity: replacement limited  Tobacco Use   Smoking status: Former    Current packs/day: 0.00    Average packs/day: 1 pack/day for 42.0 years (42.0 ttl pk-yrs)    Types: Cigarettes    Start date: 03/22/1961    Quit date: 03/23/2003    Years since quitting: 20.0   Smokeless tobacco: Never   Tobacco comments:    Former smoker 02/19/22  Vaping Use   Vaping status: Never Used  Substance and Sexual Activity   Alcohol use: No    Alcohol/week: 0.0 standard drinks of alcohol   Drug use: No   Sexual activity: Not on file  Other Topics Concern   Not on file  Social History Narrative   He and his partner [name] have now officially gotten married on 05/26/2013.  [name] is also a patient of our clinic.  He is now initially hyphenated his last name.   He has now " retired"from his long-term employment at [employer]. ->  As of September 2020   He does not smoke, he quit in 2009.  He does not drink alcohol.   He was adopted and no family history is known.    Social Drivers of Corporate investment banker Strain: Not on file  Food Insecurity: No Food Insecurity (03/27/2023)   Hunger Vital Sign    Worried About Running Out of Food in the Last Year: Never true    Ran Out of Food in the Last Year: Never true  Transportation Needs: No Transportation Needs (03/27/2023)   PRAPARE - Administrator, Civil Service (Medical): No    Lack of Transportation (Non-Medical): No  Physical Activity: Not on file  Stress: Not on file  Social Connections: Unknown (03/28/2023)   Social Connection and Isolation Panel  [NHANES]    Frequency of Communication with Friends and Family: More than three times a week    Frequency of Social Gatherings with Friends and Family: More than three times a week    Attends Religious Services: Not on Marketing executive or Organizations: Yes    Attends Banker Meetings: More than 4 times per year    Marital Status: Married     Review of Systems: A 12 point ROS discussed and pertinent positives are indicated in the HPI above.  All other systems are negative.  Review of Systems  Vital Signs: BP 133/62   Pulse 60   Temp (!) 97.5 F (36.4 C) (Oral)   Resp 18   Ht 6\' 1"  (1.854 m)   Wt 298 lb 11.6 oz (135.5 kg)   SpO2 96%   BMI 39.41 kg/m   Advance Care Plan: The advanced care place/surrogate decision maker was discussed at the time of visit and the patient did not wish to discuss or was not able to name a surrogate decision maker or provide an advance care plan.  Physical Exam Vitals reviewed.  Constitutional:      Appearance: He is obese.  HENT:     Head: Normocephalic and atraumatic.  Eyes:     Extraocular Movements: Extraocular movements intact.  Cardiovascular:     Rate and Rhythm: Normal rate and regular rhythm.  Pulmonary:     Effort: Pulmonary effort is normal. No respiratory distress.     Breath sounds: Normal breath sounds.  Abdominal:     General: There is no distension.  Palpations: Abdomen is soft.     Tenderness: There is no abdominal tenderness.  Musculoskeletal:        General: Normal range of motion.     Cervical back: Normal range of motion.  Skin:    General: Skin is warm and dry.  Neurological:     General: No focal deficit present.     Mental Status: He is alert and oriented to person, place, and time.  Psychiatric:        Mood and Affect: Mood normal.        Behavior: Behavior normal.        Thought Content: Thought content normal.        Judgment: Judgment normal.      Labs:  CBC: Recent  Labs    04/19/23 0531 04/20/23 0619 04/21/23 0525 04/22/23 0612  WBC 14.1* 12.1* 8.7 6.7  HGB 8.1* 7.4* 7.7* 7.8*  HCT 23.8* 22.0* 22.6* 22.9*  PLT 134* 143* 123* 132*    COAGS: Recent Labs    01/02/23 0624 03/28/23 0447 04/12/23 2153 04/18/23 1835 04/19/23 0531 04/20/23 0619 04/22/23 0612  INR 1.3* 1.3*  --   --   --   --   --   APTT  --   --    < > 82* 67* 81* 40*   < > = values in this interval not displayed.    BMP: Recent Labs    04/19/23 0531 04/20/23 0619 04/21/23 1042 04/22/23 0614  NA 133* 134* 131* 131*  K 3.0* 3.5 3.6 3.6  CL 100 98 97* 96*  CO2 17* 17* 19* 18*  GLUCOSE 172* 214* 185* 178*  BUN 39* 48* 64* 77*  CALCIUM 9.0 9.2 8.5* 8.8*  CREATININE 3.56* 4.62* 6.34* 7.26*  GFRNONAA 18* 13* 9* 8*    LIVER FUNCTION TESTS: Recent Labs    04/13/23 0815 04/14/23 0423 04/15/23 0357 04/16/23 0752 04/17/23 1005 04/19/23 0531 04/20/23 0619 04/21/23 1042 04/22/23 0614  BILITOT 0.5 0.4 0.4 0.6  --   --   --   --   --   AST 66* 47* 47* 50*  --   --   --   --   --   ALT 57* 50* 44 42  --   --   --   --   --   ALKPHOS 98 93 82 73  --   --   --   --   --   PROT 5.8* 5.8* 5.7* 5.8*  --   --   --   --   --   ALBUMIN 2.3* 2.2* 2.4* 3.3*   < > 3.0* 2.7* 2.5* 2.4*   < > = values in this interval not displayed.    TUMOR MARKERS: No results for input(s): "AFPTM", "CEA", "CA199", "CHROMGRNA" in the last 8760 hours.  Assessment and Plan:  Renal failure now needing hemodialysis.  Will proceed with image guided placement of a tunneled hemodialysis catheter today as IR schedule allows.  He currently has a tunneled CV line on the right placed by Dr. Lowella Dandy on 04/07/23.  Risks and benefits discussed with the patient including, but not limited to bleeding, infection, vascular injury, pneumothorax which may require chest tube placement, air embolism or even death  All of the patient's questions were answered, patient is agreeable to proceed. Consent signed and  in chart.   Thank you for allowing our service to participate in Harold [name] 's care.  Electronically Signed: Gwynneth Macleod, PA-C  04/22/2023, 11:59 AM      I spent a total of 20 Minutes  in face to face in clinical consultation, greater than 50% of which was counseling/coordinating care for HD cath plaement.  (A copy of this note was sent to the referring provider and the time of visit.)

## 2023-04-22 NOTE — Plan of Care (Signed)
  Problem: Education: Goal: Knowledge of General Education information will improve Description: Including pain rating scale, medication(s)/side effects and non-pharmacologic comfort measures Outcome: Progressing   Problem: Health Behavior/Discharge Planning: Goal: Ability to manage health-related needs will improve Outcome: Progressing   Problem: Clinical Measurements: Goal: Ability to maintain clinical measurements within normal limits will improve Outcome: Progressing Goal: Will remain free from infection Outcome: Progressing Goal: Diagnostic test results will improve Outcome: Progressing Goal: Cardiovascular complication will be avoided Outcome: Progressing   Problem: Activity: Goal: Risk for activity intolerance will decrease Outcome: Progressing   Problem: Nutrition: Goal: Adequate nutrition will be maintained Outcome: Progressing   Problem: Elimination: Goal: Will not experience complications related to bowel motility Outcome: Progressing   Problem: Pain Managment: Goal: General experience of comfort will improve and/or be controlled Outcome: Progressing   Problem: Safety: Goal: Ability to remain free from injury will improve Outcome: Progressing   Problem: Skin Integrity: Goal: Risk for impaired skin integrity will decrease Outcome: Progressing   Problem: Education: Goal: Ability to describe self-care measures that may prevent or decrease complications (Diabetes Survival Skills Education) will improve Outcome: Progressing Goal: Individualized Educational Video(s) Outcome: Progressing   Problem: Coping: Goal: Ability to adjust to condition or change in health will improve Outcome: Progressing   Problem: Fluid Volume: Goal: Ability to maintain a balanced intake and output will improve Outcome: Progressing   Problem: Health Behavior/Discharge Planning: Goal: Ability to identify and utilize available resources and services will improve Outcome:  Progressing Goal: Ability to manage health-related needs will improve Outcome: Progressing   Problem: Metabolic: Goal: Ability to maintain appropriate glucose levels will improve Outcome: Progressing   Problem: Nutritional: Goal: Maintenance of adequate nutrition will improve Outcome: Progressing Goal: Progress toward achieving an optimal weight will improve Outcome: Progressing   Problem: Skin Integrity: Goal: Risk for impaired skin integrity will decrease Outcome: Progressing   Problem: Tissue Perfusion: Goal: Adequacy of tissue perfusion will improve Outcome: Progressing   Problem: Education: Goal: Understanding of CV disease, CV risk reduction, and recovery process will improve Outcome: Progressing Goal: Individualized Educational Video(s) Outcome: Progressing   Problem: Activity: Goal: Ability to return to baseline activity level will improve Outcome: Progressing   Problem: Cardiovascular: Goal: Ability to achieve and maintain adequate cardiovascular perfusion will improve Outcome: Progressing Goal: Vascular access site(s) Level 0-1 will be maintained Outcome: Progressing   Problem: Health Behavior/Discharge Planning: Goal: Ability to safely manage health-related needs after discharge will improve Outcome: Progressing

## 2023-04-22 NOTE — Progress Notes (Signed)
 Physical Therapy Treatment Patient Details Name: Harold [name] MRN: [id number] DOB: 06-Nov-1955 Today's Date: 04/22/2023   History of Present Illness 68 y.o. male presents to [facility] 03/27/23 with productive cough and generalized weakness. Admitted with influenza A and cellulitis of R foot s/p hardware removal of the R ankle and application of wound vac on 2/10, wound vac removed 2/27. Left thigh pain, CT negative. Prior admit 11/24 for R ankle ORIF and in 12/24 for revision. S/p excisional debridement of right ankle on 2/12. Pt underwent central line placement on 04/07/2023 however he has been having issues with recurrent bleeding from the insertion site which has finally stopped. 04/17/23 pt with drop in sats requiring 2 L of O2 via [place], chest xray concerning for edema or possibly pneumonia.Significant PMH: HTN, HLD, CAD s/p PCI, COPD, DM2, CKD stage IIIb, PVD, gout, OSA, neuropathy, COPD, a-fib, CAD s/p percutaneous coronary angioplasty, angina, obesity    PT Comments  STAR PT/OT Session: Pt awaiting HD catheter placement, but agreeable to getting up to EoB. Lift blinds and have pt transfer to L side of bed so he can look out the windows for a little while. Pt very pale and with slowed response to questions. Pt  maxAx2 for coming to seated EoB. Able to sit ~12 min before becoming fatigued and request to return to bed. Attempted to come to standing in RW to take lateral steps towards the [facility] but ultimately unable requiring totalAx2 to return to supine. PT/OT hopeful that HD will improve pt ability to participate in therapy. D/c plans may need to be adjusted. PT will continue to follow acutely.    If plan is discharge home, recommend the following: A lot of help with walking and/or transfers;A lot of help with bathing/dressing/bathroom;Assistance with cooking/housework;Assist for transportation;Help with stairs or ramp for entrance   Can travel by private vehicle     Yes  Equipment Recommendations   BSC/3in1 (Bariatric; Pt has a tub bench, w/c, RW, and sliding board already)       Precautions / Restrictions Precautions Precautions: Fall Recall of Precautions/Restrictions: Impaired Precaution/Restrictions Comments: Watch BP Required Braces or Orthoses: Other Brace Other Brace: R CAM boot for ambulation, R splint in bed, abdominal binder (Prn) Restrictions Weight Bearing Restrictions Per Provider Order: Yes RLE Weight Bearing Per Provider Order: Weight bearing as tolerated Other Position/Activity Restrictions: per Dr. Audrie Lia note 04/03/2023 and orders 04/03/2023 pt WBAT     Mobility  Bed Mobility Overal bed mobility: Needs Assistance Bed Mobility: Rolling, Supine to Sit, Sit to Supine Rolling: Mod assist   Supine to sit: Max assist, +2 for physical assistance, HOB elevated Sit to supine: Total assist, +2 for physical assistance   General bed mobility comments: patient requiring assistance with moving BLE off EOB, scooting hips, and raising trunk to get to EOB    Transfers Overall transfer level: Needs assistance Equipment used: Rolling walker (2 wheels) Transfers: Sit to/from Stand Sit to Stand: Mod assist, +2 physical assistance, From elevated surface           General transfer comment: Patient stood from EOB and attempted side steps towards [facility] but unable to perform        Balance Overall balance assessment: Needs assistance Sitting-balance support: Feet supported, Bilateral upper extremity supported Sitting balance-Leahy Scale: Fair Sitting balance - Comments: supervision EOB Postural control: Posterior lean Standing balance support: Bilateral upper extremity supported, During functional activity, Reliant on assistive device for balance Standing balance-Leahy Scale: Poor Standing balance comment: Reliant on  RW                            Communication Communication Communication: No apparent difficulties  Cognition Arousal: Alert Behavior During  Therapy: Flat affect                             Following commands: Impaired Following commands impaired: Follows one step commands with increased time    Cueing Cueing Techniques: Verbal cues, Tactile cues     General Comments  Pt with increased lethargy and difficulty with participation today.       Pertinent Vitals/Pain Pain Assessment Pain Assessment: Faces Faces Pain Scale: Hurts little more Pain Location: buttocks, right side seated on EOB Pain Descriptors / Indicators: Discomfort, Guarding Pain Intervention(s): Limited activity within patient's tolerance, Monitored during session, Repositioned     PT Goals (current goals can now be found in the care plan section) Acute Rehab PT Goals PT Goal Formulation: With patient Time For Goal Achievement: 05/01/23 Potential to Achieve Goals: Fair Progress towards PT goals: Progressing toward goals    Frequency    Min 1X/week           Co-evaluation   Reason for Co-Treatment: To address functional/ADL transfers;For patient/therapist safety;Other (comment) (activity tolerance) PT goals addressed during session: Mobility/safety with mobility;Balance OT goals addressed during session: ADL's and self-care      AM-PAC PT "6 Clicks" Mobility   Outcome Measure  Help needed turning from your back to your side while in a flat bed without using bedrails?: A Little Help needed moving from lying on your back to sitting on the side of a flat bed without using bedrails?: A Lot Help needed moving to and from a bed to a chair (including a wheelchair)?: A Lot Help needed standing up from a chair using your arms (e.g., wheelchair or bedside chair)?: A Lot Help needed to walk in hospital room?: Total Help needed climbing 3-5 steps with a railing? : Total 6 Click Score: 11    End of Session Equipment Utilized During Treatment: Gait belt Activity Tolerance: Patient limited by lethargy Patient left: with call bell/phone  within reach;in bed;Other (comment) (lab tech drawing blood) Nurse Communication: Mobility status PT Visit Diagnosis: Other abnormalities of gait and mobility (R26.89);Unsteadiness on feet (R26.81);Muscle weakness (generalized) (M62.81);History of falling (Z91.81);Difficulty in walking, not elsewhere classified (R26.2)     Time: 5409-8119 PT Time Calculation (min) (ACUTE ONLY): 29 min  Charges:    $Therapeutic Activity: 8-22 mins PT General Charges $$ ACUTE PT VISIT: 1 Visit                     Kessa Fairbairn B. Beverely Risen PT, DPT Acute Rehabilitation Services Please use secure chat or  Call Office [phone]    Elon Alas [facility] 04/22/2023, 2:14 PM

## 2023-04-22 NOTE — Progress Notes (Signed)
 Occupational Therapy Treatment Patient Details Name: Harold [name] MRN: [id number] DOB: 22-Mar-1955 Today's Date: 04/22/2023   History of present illness 68 y.o. male presents to [facility] 03/27/23 with productive cough and generalized weakness. Admitted with influenza A and cellulitis of R foot s/p hardware removal of the R ankle and application of wound vac on 2/10, wound vac removed 2/27. Left thigh pain, CT negative. Prior admit 11/24 for R ankle ORIF and in 12/24 for revision. S/p excisional debridement of right ankle on 2/12. Pt underwent central line placement on 04/07/2023 however he has been having issues with recurrent bleeding from the insertion site which has finally stopped. 04/17/23 pt with drop in sats requiring 2 L of O2 via [place], chest xray concerning for edema or possibly pneumonia.Significant PMH: HTN, HLD, CAD s/p PCI, COPD, DM2, CKD stage IIIb, PVD, gout, OSA, neuropathy, COPD, a-fib, CAD s/p percutaneous coronary angioplasty, angina, obesity   OT comments  STAR patient OT/PT session: Patient more alert and states feeling better this session but continues to demonstrate increased weakness and requiring more assistance with bed mobility and sit to stands. Patient performed standing from EOB but unable to take side steps towards HOB. Patient quickly fatigues from sitting on EOB. Patient to continue to be followed by acute OT to address established goals to facilitate DC to next venue of care.       If plan is discharge home, recommend the following:  A lot of help with walking and/or transfers;A lot of help with bathing/dressing/bathroom;Assistance with cooking/housework;Help with stairs or ramp for entrance;Assist for transportation   Equipment Recommendations  Other (comment) (bariatric BSC)    Recommendations for Other Services      Precautions / Restrictions Precautions Precautions: Fall Recall of Precautions/Restrictions: Impaired Precaution/Restrictions Comments: Watch  BP Required Braces or Orthoses: Other Brace Other Brace: R CAM boot for ambulation, R splint in bed, abdominal binder (Prn) Restrictions Weight Bearing Restrictions Per Provider Order: Yes RLE Weight Bearing Per Provider Order: Weight bearing as tolerated Other Position/Activity Restrictions: per Dr. Audrie Lia note 04/03/2023 and orders 04/03/2023 pt WBAT       Mobility Bed Mobility Overal bed mobility: Needs Assistance Bed Mobility: Rolling, Supine to Sit, Sit to Supine Rolling: Mod assist   Supine to sit: Max assist, +2 for physical assistance, HOB elevated Sit to supine: Total assist, +2 for physical assistance   General bed mobility comments: patient requiring assistance with moving BLE off EOB, scooting hips, and raising trunk to get to EOB    Transfers Overall transfer level: Needs assistance Equipment used: Rolling walker (2 wheels) Transfers: Sit to/from Stand Sit to Stand: Mod assist, +2 physical assistance, From elevated surface           General transfer comment: Patient stood from EOB and attempted side steps towards [facility] but unable to perform     Balance Overall balance assessment: Needs assistance Sitting-balance support: Feet supported, Bilateral upper extremity supported Sitting balance-Leahy Scale: Fair Sitting balance - Comments: supervision EOB Postural control: Posterior lean Standing balance support: Bilateral upper extremity supported, During functional activity, Reliant on assistive device for balance Standing balance-Leahy Scale: Poor Standing balance comment: Reliant on RW                           ADL either performed or assessed with clinical judgement   ADL Overall ADL's : Needs assistance/impaired             Lower Body Bathing:  Maximal assistance;Sit to/from stand Lower Body Bathing Details (indicate cue type and reason): peri area cleaning performed while standing     Lower Body Dressing: Total assistance;Bed level Lower  Body Dressing Details (indicate cue type and reason): donned CAM boot and splint for RLE               General ADL Comments: continues to demonstrate increased weakness and increase assistance needed with self care tasks    Extremity/Trunk Assessment Upper Extremity Assessment Upper Extremity Assessment: Overall WFL for tasks assessed            Vision       Perception     Praxis     Communication Communication Communication: No apparent difficulties   Cognition Arousal: Alert Behavior During Therapy: Flat affect Cognition: No apparent impairments                               Following commands: Impaired Following commands impaired: Follows one step commands with increased time      Cueing   Cueing Techniques: Verbal cues, Tactile cues  Exercises      Shoulder Instructions       General Comments      Pertinent Vitals/ Pain       Pain Assessment Pain Assessment: Faces Faces Pain Scale: Hurts little more Pain Location: buttocks, right side seated on EOB Pain Descriptors / Indicators: Discomfort, Guarding Pain Intervention(s): Repositioned  Home Living                                          Prior Functioning/Environment              Frequency  Min 1X/week        Progress Toward Goals  OT Goals(current goals can now be found in the care plan section)  Progress towards OT goals: Progressing toward goals  Acute Rehab OT Goals Patient Stated Goal: feel better OT Goal Formulation: With patient Time For Goal Achievement: 05/01/23 Potential to Achieve Goals: Good ADL Goals Pt Will Perform Grooming: with set-up;sitting (wash face, mouth care) Pt Will Perform Lower Body Dressing: with min assist;with adaptive equipment;with caregiver independent in assisting;sitting/lateral leans;sit to/from stand Pt Will Transfer to Toilet: stand pivot transfer;with min assist;bedside commode Pt Will Perform Toileting -  Clothing Manipulation and hygiene: with min assist;sitting/lateral leans;sit to/from stand Pt/caregiver will Perform Home Exercise Program: Increased strength;Both right and left upper extremity;With theraband;With written HEP provided (level 2-3) Additional ADL Goal #1: Pt will be Mod I in and OOB for basic ADLs  Plan      Co-evaluation    PT/OT/SLP Co-Evaluation/Treatment: Yes Reason for Co-Treatment: To address functional/ADL transfers;For patient/therapist safety;Other (comment) (activity tolerance) PT goals addressed during session: Mobility/safety with mobility;Balance OT goals addressed during session: ADL's and self-care      AM-PAC OT "6 Clicks" Daily Activity     Outcome Measure   Help from another person eating meals?: None Help from another person taking care of personal grooming?: A Little Help from another person toileting, which includes using toliet, bedpan, or urinal?: A Lot Help from another person bathing (including washing, rinsing, drying)?: A Lot Help from another person to put on and taking off regular upper body clothing?: A Little Help from another person to put on and taking off regular lower body clothing?:  Total 6 Click Score: 15    End of Session Equipment Utilized During Treatment: Gait belt;Rolling walker (2 wheels)  OT Visit Diagnosis: Unsteadiness on feet (R26.81);Other abnormalities of gait and mobility (R26.89);Muscle weakness (generalized) (M62.81);Pain Pain - Right/Left: Right Pain - part of body:  (buttocks)   Activity Tolerance Patient tolerated treatment well   Patient Left in bed;with call bell/phone within reach;with bed alarm set   Nurse Communication Mobility status        Time: 1610-9604 OT Time Calculation (min): 31 min  Charges: OT General Charges $OT Visit: 1 Visit OT Treatments $Self Care/Home Management : 8-22 mins  Alfonse Flavors, OTA Acute Rehabilitation Services  Office [phone]   Dewain Penning 04/22/2023, 1:44  PM

## 2023-04-22 NOTE — Progress Notes (Signed)
 Admit: 03/27/2023 LOS: 26  64M with AKI on CKD3 likely from ATN  Subjective:  Anuric Continues to climb K3.6, bicarbonate 18, BUN 77  No intake/output data recorded.  Filed Weights   04/20/23 0528 04/21/23 0420 04/22/23 0500  Weight: 133.9 kg 134.7 kg 135.5 kg    Scheduled Meds:  allopurinol  50 mg Oral Daily   amiodarone  200 mg Oral BID   carvedilol  3.125 mg Oral BID WC   Chlorhexidine Gluconate Cloth  6 each Topical Q0600   clopidogrel  75 mg Oral Q breakfast   docusate sodium  100 mg Oral BID   DULoxetine  60 mg Oral Daily   ezetimibe  10 mg Oral Daily   gabapentin  300 mg Oral QHS   guaiFENesin  600 mg Oral BID   insulin aspart  0-9 Units Subcutaneous Q6H   insulin glargine  40 Units Subcutaneous Daily   levothyroxine  88 mcg Oral QAC breakfast   polyethylene glycol  17 g Oral Daily   sodium chloride flush  3 mL Intravenous Q12H   sodium chloride flush  3-10 mL Intravenous Q12H   tamsulosin  0.4 mg Oral Daily   Continuous Infusions:  vancomycin     PRN Meds:.acetaminophen **OR** acetaminophen, calcium carbonate, hydrALAZINE, melatonin, methocarbamol **OR** methocarbamol (ROBAXIN) injection, metoCLOPramide **OR** metoCLOPramide (REGLAN) injection, naLOXone (NARCAN)  injection, ondansetron (ZOFRAN) IV, sodium chloride flush, sodium chloride flush  Current Labs: reviewed   Physical Exam:  Blood pressure 133/62, pulse 60, temperature (!) 97.5 F (36.4 C), temperature source Oral, resp. rate 18, height 6\' 1"  (1.854 m), weight 135.5 kg, SpO2 96%. NAD, sleeping, awakens to voice Regular, normal S1 and S2 Clear bilaterally Lower extremity edema is trace, ankle hardware present  A Anuric AKI on CKD 3 Hypertension with tendency towards orthostasis on low-dose carvedilol and tamsulosin Nephrotic range proteinuria with negative serological evaluation but decision against biopsy at the current time per previous notes Right lower extremity infection with involvement of  orthopedic hardware status post removal and antibiotics discontinued 2/28 with concern for AIN Chronic anemia, hemoglobin in the 7s Mild hyponatremia Increased anion gap metabolic acidosis  P Given progression to and uremia with worsening creatinine plan to initiate dialysis as an AKI IR consulted for tunneled dialysis catheter First HD treatment thereafter Medication Issues; Preferred narcotic agents for pain control are hydromorphone, fentanyl, and methadone. Morphine should not be used.  Baclofen should be avoided Avoid oral sodium phosphate and magnesium citrate based laxatives / bowel preps    Sabra Heck MD 04/22/2023, 11:41 AM  Recent Labs  Lab 04/20/23 0619 04/21/23 1042 04/22/23 0614  NA 134* 131* 131*  K 3.5 3.6 3.6  CL 98 97* 96*  CO2 17* 19* 18*  GLUCOSE 214* 185* 178*  BUN 48* 64* 77*  CREATININE 4.62* 6.34* 7.26*  CALCIUM 9.2 8.5* 8.8*  PHOS 3.8 5.0* 5.3*   Recent Labs  Lab 04/15/23 1452 04/16/23 0752 04/20/23 0619 04/21/23 0525 04/22/23 0612  WBC 8.0   < > 12.1* 8.7 6.7  NEUTROABS 6.0  --   --   --   --   HGB 9.2*   < > 7.4* 7.7* 7.8*  HCT 27.0*   < > 22.0* 22.6* 22.9*  MCV 86.3   < > 88.0 87.3 86.4  PLT 168   < > 143* 123* 132*   < > = values in this interval not displayed.

## 2023-04-22 NOTE — Plan of Care (Signed)
  Problem: Pain Managment: Goal: General experience of comfort will improve and/or be controlled Outcome: Progressing   Problem: Clinical Measurements: Goal: Ability to maintain clinical measurements within normal limits will improve Outcome: Not Progressing   Problem: Nutrition: Goal: Adequate nutrition will be maintained Outcome: Not Progressing

## 2023-04-22 NOTE — TOC Progression Note (Signed)
 Transition of Care [facility]) - Progression Note    Patient Details  Name: Harold [name] MRN: [id number] Date of Birth: 08/25/1955  Transition of [facility] CM/SW Contact  Graves-Bigelow, Lamar Laundry, RN Phone Number: 04/22/2023, 10:06 AM  Clinical Narrative:  Patient discussed in progression rounds. Patient continues on IV antibiotics. AKI on CKD -nephrology consulting. Case Manager will continue to follow for additional transition of care needs.  Expected Discharge Plan: Home w Home Health Services Barriers to Discharge: No Barriers Identified  Expected Discharge Plan and Services In-house Referral: Clinical Social Work Discharge Planning Services: CM Consult Post Acute Care Choice: Home Health Living arrangements for the past 2 months: Single Family Home Expected Discharge Date: 04/10/23               DME Arranged: IV pump/equipment DME Agency:  [facility]ianne Rice)      HH Arranged: RN, Disease Management, PT, OT, Nurse's Aide HH Agency: [facility]  Social Determinants of Health (SDOH) Interventions SDOH Screenings   Food Insecurity: No Food Insecurity (03/27/2023)  Housing: Low Risk  (03/28/2023)  Transportation Needs: No Transportation Needs (03/27/2023)  Utilities: Not At Risk (03/28/2023)  Social Connections: Unknown (03/28/2023)  Tobacco Use: Medium Risk (04/02/2023)    Readmission Risk Interventions     No data to display

## 2023-04-23 ENCOUNTER — Encounter (HOSPITAL_COMMUNITY): Payer: Self-pay | Admitting: Internal Medicine

## 2023-04-23 ENCOUNTER — Other Ambulatory Visit (HOSPITAL_COMMUNITY): Payer: Self-pay

## 2023-04-23 DIAGNOSIS — N179 Acute kidney failure, unspecified: Secondary | ICD-10-CM | POA: Diagnosis not present

## 2023-04-23 DIAGNOSIS — I480 Paroxysmal atrial fibrillation: Secondary | ICD-10-CM | POA: Diagnosis not present

## 2023-04-23 DIAGNOSIS — E66812 Obesity, class 2: Secondary | ICD-10-CM | POA: Insufficient documentation

## 2023-04-23 DIAGNOSIS — L03115 Cellulitis of right lower limb: Secondary | ICD-10-CM | POA: Diagnosis not present

## 2023-04-23 DIAGNOSIS — I10 Essential (primary) hypertension: Secondary | ICD-10-CM | POA: Diagnosis not present

## 2023-04-23 LAB — RENAL FUNCTION PANEL
Albumin: 2.2 g/dL — ABNORMAL LOW (ref 3.5–5.0)
Anion gap: 12 (ref 5–15)
BUN: 72 mg/dL — ABNORMAL HIGH (ref 8–23)
CO2: 22 mmol/L (ref 22–32)
Calcium: 8.3 mg/dL — ABNORMAL LOW (ref 8.9–10.3)
Chloride: 96 mmol/L — ABNORMAL LOW (ref 98–111)
Creatinine, Ser: 7.18 mg/dL — ABNORMAL HIGH (ref 0.61–1.24)
GFR, Estimated: 8 mL/min — ABNORMAL LOW (ref >60)
Glucose, Bld: 130 mg/dL — ABNORMAL HIGH (ref 70–99)
Phosphorus: 4.6 mg/dL (ref 2.5–4.6)
Potassium: 3.5 mmol/L (ref 3.5–5.1)
Sodium: 130 mmol/L — ABNORMAL LOW (ref 135–145)

## 2023-04-23 LAB — HEPATITIS B SURFACE ANTIBODY, QUANTITATIVE: Hep B S AB Quant (Post): <3.5 m[IU]/mL — ABNORMAL LOW

## 2023-04-23 LAB — GLUCOSE, CAPILLARY
Glucose-Capillary: 121 mg/dL — ABNORMAL HIGH (ref 70–99)
Glucose-Capillary: 130 mg/dL — ABNORMAL HIGH (ref 70–99)
Glucose-Capillary: 148 mg/dL — ABNORMAL HIGH (ref 70–99)
Glucose-Capillary: 187 mg/dL — ABNORMAL HIGH (ref 70–99)

## 2023-04-23 LAB — CBC
HCT: 22.2 % — ABNORMAL LOW (ref 39.0–52.0)
Hemoglobin: 7.7 g/dL — ABNORMAL LOW (ref 13.0–17.0)
MCH: 29.3 pg (ref 26.0–34.0)
MCHC: 34.7 g/dL (ref 30.0–36.0)
MCV: 84.4 fL (ref 80.0–100.0)
Platelets: 136 10*3/uL — ABNORMAL LOW (ref 150–400)
RBC: 2.63 MIL/uL — ABNORMAL LOW (ref 4.22–5.81)
RDW: 15.7 % — ABNORMAL HIGH (ref 11.5–15.5)
WBC: 4.9 10*3/uL (ref 4.0–10.5)
nRBC: 0 % (ref 0.0–0.2)

## 2023-04-23 LAB — HEPARIN LEVEL (UNFRACTIONATED): Heparin Unfractionated: 0.27 [IU]/mL — ABNORMAL LOW (ref 0.30–0.70)

## 2023-04-23 LAB — APTT: aPTT: 44 s — ABNORMAL HIGH (ref 24–36)

## 2023-04-23 MED ORDER — ATORVASTATIN CALCIUM 40 MG PO TABS
40.0000 mg | ORAL_TABLET | Freq: Every day | ORAL | Status: DC
  Administered 2023-04-24 – 2023-05-10 (×17): 40 mg via ORAL
  Filled 2023-04-23 (×17): qty 1

## 2023-04-23 MED ORDER — HEPARIN (PORCINE) 25000 UT/250ML-% IV SOLN
1550.0000 [IU]/h | INTRAVENOUS | Status: AC
Start: 2023-04-23 — End: 2023-04-28
  Administered 2023-04-23: 1200 [IU]/h via INTRAVENOUS
  Administered 2023-04-24: 1350 [IU]/h via INTRAVENOUS
  Administered 2023-04-25 – 2023-04-26 (×3): 1450 [IU]/h via INTRAVENOUS
  Administered 2023-04-27 – 2023-04-28 (×2): 1550 [IU]/h via INTRAVENOUS
  Filled 2023-04-23 (×8): qty 250

## 2023-04-23 MED ORDER — HEPARIN SODIUM (PORCINE) 1000 UNIT/ML IJ SOLN
INTRAMUSCULAR | Status: AC
  Administered 2023-04-23: 4000 [IU] via INTRAVENOUS_CENTRAL
  Filled 2023-04-23: qty 4

## 2023-04-23 NOTE — Progress Notes (Addendum)
 PHARMACY - ANTICOAGULATION CONSULT NOTE  Pharmacy Consult for heparin Indication: atrial fibrillation  Allergies  Allergen Reactions   Doxycycline Hives and Rash   Septra [Bactrim] Itching   Penicillins Rash    Allergy to All cillin drugs  Ancef given 9/24 with no obvious reaction   Metformin And Related Diarrhea and Nausea Only   Other Nausea And Vomiting    General Anesthesia - sometimes causes nausea and vomiting * OK to use scopolamine patch     Sulfamethoxazole-Trimethoprim Other (See Comments)   Wellbutrin [Bupropion]     Mood Changes    Patient Measurements: Height: 6\' 1"  (185.4 cm) Weight: 134.7 kg (296 lb 15.4 oz) IBW/kg (Calculated) : 79.9 Heparin Dosing Weight: 112.8 kg  Vital Signs: Temp: 98 F (36.7 C) (03/05 1100) Temp Source: Oral (03/05 1100) BP: 112/51 (03/05 1100) Pulse Rate: 66 (03/05 1100)  Labs: Recent Labs    04/21/23 0525 04/21/23 1042 04/22/23 0612 04/22/23 0613 04/22/23 0614 04/23/23 0557 04/23/23 0558  HGB 7.7*  --  7.8*  --   --  7.7*  --   HCT 22.6*  --  22.9*  --   --  22.2*  --   PLT 123*  --  132*  --   --  136*  --   APTT  --   --  40*  --   --  44*  --   HEPARINUNFRC  --   --   --  0.42  --   --  0.27*  CREATININE  --  6.34*  --   --  7.26*  --  7.18*    Estimated Creatinine Clearance: 14 mL/min (A) (by C-G formula based on SCr of 7.18 mg/dL (H)).   Medical History: Past Medical History:  Diagnosis Date   Anemia    Anginal pain (HCC) 04/2013   Cardiac cath showed patent stents with distal LAD, circumflex-OM and RCA disease in small vessels.   Anxiety    Atrial fibrillation (HCC) 12/2021   CAD S/P percutaneous coronary angioplasty 2003, 04/2012   status post PCI to LAD, circumflex-OM 2, RCA   Carotid artery occlusion    Chronic renal insufficiency, stage II (mild)    Chronic venous insufficiency    varicosities, no reflux; dopplers 04/14/12- valvular insufficiency in the R and L GSV   Complication of anesthesia    COPD,  mild (HCC)    Depression    situaltional    Diabetes mellitus    Diabetic neuropathy (HCC) 08/24/2019   Dyslipidemia associated with type 2 diabetes mellitus (HCC)    GERD (gastroesophageal reflux disease)    History of cardioversion 12/2021   at [facility]   HTN (hypertension)    Hypothyroidism    Neuromuscular disorder (HCC)    neuropathy in feet   Neuropathy    notably improved following PCI with improved cardiac function   Obesity    Obesity, Class II, BMI 35.0-39.9, with comorbidity (see actual BMI)    BMI 39; wgt loss efforts in place; seeing Dietician   Orthostatic hypotension    PONV (postoperative nausea and vomiting)    "[employer]"   Pulmonary hypertension (HCC) 04/2012   PA pressure   Sleep apnea    uses nightly   Vitamin D deficiency    per facility notes    Medications:  Scheduled:   allopurinol  50 mg Oral Daily   amiodarone  200 mg Oral BID   [START ON 04/24/2023] atorvastatin  40 mg Oral  Daily   carvedilol  3.125 mg Oral BID WC   Chlorhexidine Gluconate Cloth  6 each Topical Q0600   clopidogrel  75 mg Oral Q breakfast   docusate sodium  100 mg Oral BID   DULoxetine  60 mg Oral Daily   ezetimibe  10 mg Oral Daily   gabapentin  300 mg Oral QHS   guaiFENesin  600 mg Oral BID   insulin aspart  0-9 Units Subcutaneous Q6H   insulin glargine  40 Units Subcutaneous Daily   levothyroxine  88 mcg Oral QAC breakfast   polyethylene glycol  17 g Oral Daily   sodium chloride flush  3 mL Intravenous Q12H   sodium chloride flush  3-10 mL Intravenous Q12H   tamsulosin  0.4 mg Oral Daily   Infusions:     Assessment: 68 y/o male with history of afib on apixaban. Eliquis has been held d/t bleeding from CVL site. IV Heparin was started but then stopped 3/2 due to bleeding from the IV line and has remained on hold for HD cath placement. Plans are to resume IV heparin then resume apixaban later  -hg= 7.7 (low stable), plt= 136 -last heparin rate was 1700  units/hr on 3/1 and aPTT at goal  -last apixaban dose was 2/25 at 9am   Goal of Therapy:  Heparin level= 0.3-0.5 aPTT= 66-85 Monitor platelets by anticoagulation protocol: Yes   Plan:  -No heparin bolus -Restart heparin at 1300 unit/hr (will start conservatively as increase to goal) -Heparin level and aPTT in 8 hrs  Harland German, PharmD Clinical Pharmacist **Pharmacist phone directory can now be found on [email or web address] ([email or web address]).  Listed under [facility].

## 2023-04-23 NOTE — Progress Notes (Signed)
 Mobility Specialist Progress Note;   04/23/23 1145  Mobility  Activity Transferred from chair to bed  Level of Assistance +2 (takes two people) (ModA)  Assistive Device Stedy  RLE [employer] Per Provider Order WBAT  Activity Response Tolerated well  Mobility Referral Yes  Mobility visit 1 Mobility  Mobility Specialist Start Time (ACUTE ONLY) 1145  Mobility Specialist Stop Time (ACUTE ONLY) 1205  Mobility Specialist Time Calculation (min) (ACUTE ONLY) 20 min   RN requesting assistance transferring pt back to bed. Required ModA +2 to safely transfer pt from chair to bed via Stedy. VSS throughout and no c/o. Pt left comfortably in bed with all needs met. RN in room.   Caesar Bookman Mobility Specialist Please contact via SecureChat or [employer] [phone]

## 2023-04-23 NOTE — Assessment & Plan Note (Signed)
 04-23-2023 will need PT consult. May need SNF

## 2023-04-23 NOTE — Progress Notes (Signed)
   04/23/23 0045  Vitals  Temp 97.8 F (36.6 C)  Temp Source Axillary  BP 137/69  MAP (mmHg) 87  BP Location Left Arm  BP Method Automatic  Patient Position (if appropriate) Lying  Pulse Rate 61  ECG Heart Rate 61  Resp 16  Oxygen Therapy  SpO2 99 %  O2 Device Room Air  During Treatment Monitoring  Blood Flow Rate (mL/min) 0 mL/min  Arterial Pressure (mmHg) -31.91 mmHg  Venous Pressure (mmHg) 113.13 mmHg  TMP (mmHg) -2.02 mmHg  Ultrafiltration Rate (mL/min) 552 mL/min  Dialysate Flow Rate (mL/min) 300 ml/min  Dialysate Potassium Concentration 3  Dialysate Calcium Concentration 2.5  Duration of HD Treatment -hour(s) 2 hour(s)  Cumulative Fluid Removed (mL) per Treatment  500.1  Intra-Hemodialysis Comments Tx completed  Post Treatment  Dialyzer Clearance Lightly streaked  Liters Processed 25  Fluid Removed (mL) 500 mL  Tolerated HD Treatment Yes  Hemodialysis Catheter Left Internal jugular Double lumen Permanent (Tunneled)  Placement Date/Time: 04/22/23 1444   Placed prior to admission: No  Serial / Lot #: [id number]  Expiration Date: 05/19/27  Time Out: Correct patient;Correct site;Correct procedure  Maximum sterile barrier precautions: Hand hygiene;Cap;Mask;Sterile gown...  Site Condition No complications  Blue Lumen Status Flushed;Heparin locked  Red Lumen Status Flushed;Heparin locked  Catheter fill solution Heparin 1000 units/ml  Catheter fill volume (Arterial) 1.9 cc  Catheter fill volume (Venous) 1.9  Dressing Type Transparent  Dressing Status Antimicrobial disc/dressing in place;Clean, Dry, Intact  Interventions New dressing  Drainage Description None  Dressing Change Due 04/29/23  Post treatment catheter status Capped and Clamped   Pt treatment uneventful

## 2023-04-23 NOTE — Assessment & Plan Note (Signed)
 04-23-2023 prn cpap.

## 2023-04-23 NOTE — Assessment & Plan Note (Signed)
 Baseline creatinine appears to be 1.9-2.2.

## 2023-04-23 NOTE — Assessment & Plan Note (Signed)
 See "cellulitis of right lower extremity"

## 2023-04-23 NOTE — Assessment & Plan Note (Signed)
 Estimated body mass index is 39.18 kg/m as calculated from the following:   Height as of this encounter: 6\' 1"  (1.854 m).   Weight as of this encounter: 134.7 kg.

## 2023-04-23 NOTE — Progress Notes (Signed)
 Admit: 03/27/2023 LOS: 27  64M with AKI on CKD3 likely from ATN  Subjective:  TDC and HD #1 yesterday; 0.5L UF 0.4L UOP yeserday No c/o this AM, HD went ok  03/04 0701 - 03/05 0700 In: -  Out: 900 [Urine:400]  Filed Weights   04/21/23 0420 04/22/23 0500 04/23/23 0307  Weight: 134.7 kg 135.5 kg 134.7 kg    Scheduled Meds:  allopurinol  50 mg Oral Daily   amiodarone  200 mg Oral BID   carvedilol  3.125 mg Oral BID WC   Chlorhexidine Gluconate Cloth  6 each Topical Q0600   clopidogrel  75 mg Oral Q breakfast   docusate sodium  100 mg Oral BID   DULoxetine  60 mg Oral Daily   ezetimibe  10 mg Oral Daily   gabapentin  300 mg Oral QHS   guaiFENesin  600 mg Oral BID   insulin aspart  0-9 Units Subcutaneous Q6H   insulin glargine  40 Units Subcutaneous Daily   levothyroxine  88 mcg Oral QAC breakfast   polyethylene glycol  17 g Oral Daily   sodium chloride flush  3 mL Intravenous Q12H   sodium chloride flush  3-10 mL Intravenous Q12H   tamsulosin  0.4 mg Oral Daily   Continuous Infusions:   PRN Meds:.acetaminophen **OR** acetaminophen, calcium carbonate, hydrALAZINE, melatonin, methocarbamol **OR** methocarbamol (ROBAXIN) injection, metoCLOPramide **OR** metoCLOPramide (REGLAN) injection, naLOXone (NARCAN)  injection, ondansetron (ZOFRAN) IV, sodium chloride flush, sodium chloride flush  Current Labs: reviewed   Physical Exam:  Blood pressure (!) 112/51, pulse 66, temperature 98 F (36.7 C), temperature source Oral, resp. rate 16, height 6\' 1"  (1.854 m), weight 134.7 kg, SpO2 94%. NAD, sleeping, awakens to voice; on CPAP Regular, normal S1 and S2 Clear bilaterally Lower extremity edema is trace, ankle hardware present [facility] c/d/i  A Dialysis dependent Anuric AKI on CKD 3; ATN and AIN both possible Hypertension with tendency towards orthostasis on low-dose carvedilol and tamsulosin Nephrotic range proteinuria with negative serological evaluation but decision against biopsy  at the current time per previous notes Right lower extremity infection with involvement of orthopedic hardware status post removal and antibiotics discontinued 2/28 with concern for AIN Chronic anemia, hemoglobin in the 7s Mild hyponatremia Increased anion gap metabolic acidosis  P HD #2 tomorrow If no sig inc in UOP start CLIP as AKI this week Medication Issues; Preferred narcotic agents for pain control are hydromorphone, fentanyl, and methadone. Morphine should not be used.  Baclofen should be avoided Avoid oral sodium phosphate and magnesium citrate based laxatives / bowel preps    Sabra Heck MD 04/23/2023, 12:42 PM  Recent Labs  Lab 04/21/23 1042 04/22/23 0614 04/23/23 0558  NA 131* 131* 130*  K 3.6 3.6 3.5  CL 97* 96* 96*  CO2 19* 18* 22  GLUCOSE 185* 178* 130*  BUN 64* 77* 72*  CREATININE 6.34* 7.26* 7.18*  CALCIUM 8.5* 8.8* 8.3*  PHOS 5.0* 5.3* 4.6   Recent Labs  Lab 04/21/23 0525 04/22/23 0612 04/23/23 0557  WBC 8.7 6.7 4.9  HGB 7.7* 7.8* 7.7*  HCT 22.6* 22.9* 22.2*  MCV 87.3 86.4 84.4  PLT 123* 132* 136*

## 2023-04-23 NOTE — Progress Notes (Signed)
 Occupational Therapy Treatment Patient Details Name: Harold [name] MRN: [id number] DOB: 1955/05/07 Today's Date: 04/23/2023   History of present illness 68 y.o. male presents to [facility] 03/27/23 with productive cough and generalized weakness. Admitted with influenza A and cellulitis of R foot s/p hardware removal of the R ankle and application of wound vac on 2/10, wound vac removed 2/27. Left thigh pain, CT negative. Prior admit 11/24 for R ankle ORIF and in 12/24 for revision. S/p excisional debridement of right ankle on 2/12. Pt underwent central line placement on 04/07/2023 however he has been having issues with recurrent bleeding from the insertion site which has finally stopped. 04/17/23 pt with drop in sats requiring 2 L of O2 via [place], chest xray concerning for edema or possibly pneumonia.Significant PMH: HTN, HLD, CAD s/p PCI, COPD, DM2, CKD stage IIIb, PVD, gout, OSA, neuropathy, COPD, a-fib, CAD s/p percutaneous coronary angioplasty, angina, obesity   OT comments  STAR patient OT/PT session: Patient received in supine and stating he was feeling better and willing to participate with OT/PT treatment. Patient demonstrating good gains with bed mobility but continues to require +2 assist. Patient demonstrating posterior leaning while on EOB stating due to right hip pain. Patient will to attempt transfer to recliner with mod assist +2 to stand but was unable to perform transfer due to weakness. Patient was able to stand with mod assist +2 into stedy and perform transfer with patient able to stand with min assist from stedy pads. Patient to continue to be followed by acute OT with STAR program to address established goals.        If plan is discharge home, recommend the following:  A lot of help with walking and/or transfers;A lot of help with bathing/dressing/bathroom;Assistance with cooking/housework;Help with stairs or ramp for entrance;Assist for transportation   Equipment Recommendations  Other  (comment) (bariatric BSC)    Recommendations for Other Services      Precautions / Restrictions Precautions Precautions: Fall Recall of Precautions/Restrictions: Impaired Precaution/Restrictions Comments: Watch BP Required Braces or Orthoses: Other Brace Other Brace: R CAM boot for ambulation, R splint in bed, abdominal binder (Prn) Restrictions Weight Bearing Restrictions Per Provider Order: Yes RLE Weight Bearing Per Provider Order: Weight bearing as tolerated Other Position/Activity Restrictions: per Dr. Audrie Lia note 04/03/2023 and orders 04/03/2023 pt WBAT       Mobility Bed Mobility Overal bed mobility: Needs Assistance Bed Mobility: Supine to Sit     Supine to sit: Mod assist, +2 for physical assistance     General bed mobility comments: increased time and assistance with BLEs and trunk, bed pad used to assist with scooting hips forward    Transfers Overall transfer level: Needs assistance Equipment used: Rolling walker (2 wheels), Ambulation equipment used Transfers: Sit to/from Stand, Bed to chair/wheelchair/BSC Sit to Stand: Mod assist, +2 physical assistance, From elevated surface           General transfer comment: Patient required mod assist +2 to stand from EOB to RW. Patient attempted transfer to recliner but was unsafe and had difficulty with stepping towards recliner. Stedy used for transfer with mod assist +2 to stand from EOB and min assist from [employer] via Lift Equipment: Stedy   Balance Overall balance assessment: Needs assistance Sitting-balance support: Feet supported, Bilateral upper extremity supported Sitting balance-Leahy Scale: Poor Sitting balance - Comments: patient required assistance seated on EOB due to posterior leaning which patient stated was due to hip pain Postural control: Posterior lean Standing balance  support: Bilateral upper extremity supported, During functional activity, Reliant on assistive device for  balance Standing balance-Leahy Scale: Poor Standing balance comment: reliant on external device for support                           ADL either performed or assessed with clinical judgement   ADL Overall ADL's : Needs assistance/impaired     Grooming: Wash/dry hands;Wash/dry face;Set up;Sitting               Lower Body Dressing: Total assistance;Bed level Lower Body Dressing Details (indicate cue type and reason): donned CAM boot and splint for RLE                    Extremity/Trunk Assessment              Vision       Perception     Praxis     Communication Communication Communication: No apparent difficulties   Cognition Arousal: Alert Behavior During Therapy: Flat affect Cognition: No apparent impairments                               Following commands: Impaired Following commands impaired: Follows one step commands with increased time      Cueing   Cueing Techniques: Verbal cues, Tactile cues  Exercises      Shoulder Instructions       General Comments BP 112/51 (69) seated in recliner    Pertinent Vitals/ Pain       Pain Assessment Pain Assessment: Faces Faces Pain Scale: Hurts even more Pain Location: right hip when sitting on EOB Pain Descriptors / Indicators: Discomfort, Guarding Pain Intervention(s): Limited activity within patient's tolerance, Monitored during session, Repositioned  Home Living                                          Prior Functioning/Environment              Frequency  Min 1X/week        Progress Toward Goals  OT Goals(current goals can now be found in the care plan section)  Progress towards OT goals: Progressing toward goals  Acute Rehab OT Goals Patient Stated Goal: feel better OT Goal Formulation: With patient Time For Goal Achievement: 05/01/23 Potential to Achieve Goals: Good ADL Goals Pt Will Perform Grooming: with set-up;sitting (wash  face, mouth care) Pt Will Perform Lower Body Dressing: with min assist;with adaptive equipment;with caregiver independent in assisting;sitting/lateral leans;sit to/from stand Pt Will Transfer to Toilet: stand pivot transfer;with min assist;bedside commode Pt Will Perform Toileting - Clothing Manipulation and hygiene: with min assist;sitting/lateral leans;sit to/from stand Pt/caregiver will Perform Home Exercise Program: Increased strength;Both right and left upper extremity;With theraband;With written HEP provided Additional ADL Goal #1: Pt will be Mod I in and OOB for basic ADLs  Plan      Co-evaluation    PT/OT/SLP Co-Evaluation/Treatment: Yes Reason for Co-Treatment: To address functional/ADL transfers;For patient/therapist safety;Other (comment) (activity tolerance)   OT goals addressed during session: ADL's and self-care      AM-PAC OT "6 Clicks" Daily Activity     Outcome Measure   Help from another person eating meals?: None Help from another person taking care of personal grooming?: A Little Help from another person toileting,  which includes using toliet, bedpan, or urinal?: A Lot Help from another person bathing (including washing, rinsing, drying)?: A Lot Help from another person to put on and taking off regular upper body clothing?: A Little Help from another person to put on and taking off regular lower body clothing?: Total 6 Click Score: 15    End of Session Equipment Utilized During Treatment: Gait belt;Rolling walker (2 wheels);Other (comment) Antony Salmon)  OT Visit Diagnosis: Unsteadiness on feet (R26.81);Other abnormalities of gait and mobility (R26.89);Muscle weakness (generalized) (M62.81);Pain Pain - Right/Left: Right Pain - part of body: Hip   Activity Tolerance Patient tolerated treatment well   Patient Left in chair;with call bell/phone within reach;with chair alarm set   Nurse Communication Mobility status;Need for lift equipment        Time:  1041-1108 OT Time Calculation (min): 27 min  Charges: OT General Charges $OT Visit: 1 Visit OT Treatments $Self Care/Home Management : 8-22 mins  Alfonse Flavors, OTA Acute Rehabilitation Services  Office [phone]   Dewain Penning 04/23/2023, 1:36 PM

## 2023-04-23 NOTE — Assessment & Plan Note (Signed)
 04-23-2023 stable. On zetia. Will restart lipitor

## 2023-04-23 NOTE — Progress Notes (Signed)
    [facility]  Date of Admission:  03/27/2023     Reason for Follow Up: Post-operative wound infection          Brief Progress Note:  [name] remains afebrile and without leukocytosis in the setting of post-operative wound infection and Influenza A (resolved). Has been off antibiotics for 3 days prior to a dose of vancomycin yesterday. Renal function remains tenuous and has completed first intermittent HD session with Nephrology following. Will check Sed Rate and CRP. Continue to monitor off antibiotics at this time.    Marcos Eke, NP [facility]  04/23/2023  12:44 PM

## 2023-04-23 NOTE — Plan of Care (Signed)
  Problem: Education: Goal: Knowledge of General Education information will improve Description: Including pain rating scale, medication(s)/side effects and non-pharmacologic comfort measures Outcome: Progressing   Problem: Health Behavior/Discharge Planning: Goal: Ability to manage health-related needs will improve Outcome: Progressing   Problem: Clinical Measurements: Goal: Ability to maintain clinical measurements within normal limits will improve Outcome: Progressing Goal: Will remain free from infection Outcome: Progressing Goal: Diagnostic test results will improve Outcome: Progressing Goal: Cardiovascular complication will be avoided Outcome: Progressing   Problem: Activity: Goal: Risk for activity intolerance will decrease Outcome: Progressing   Problem: Nutrition: Goal: Adequate nutrition will be maintained Outcome: Progressing   Problem: Elimination: Goal: Will not experience complications related to bowel motility Outcome: Progressing   Problem: Pain Managment: Goal: General experience of comfort will improve and/or be controlled Outcome: Progressing   Problem: Safety: Goal: Ability to remain free from injury will improve Outcome: Progressing   Problem: Skin Integrity: Goal: Risk for impaired skin integrity will decrease Outcome: Progressing   Problem: Education: Goal: Ability to describe self-care measures that may prevent or decrease complications (Diabetes Survival Skills Education) will improve Outcome: Progressing Goal: Individualized Educational Video(s) Outcome: Progressing   Problem: Coping: Goal: Ability to adjust to condition or change in health will improve Outcome: Progressing   Problem: Fluid Volume: Goal: Ability to maintain a balanced intake and output will improve Outcome: Progressing   Problem: Health Behavior/Discharge Planning: Goal: Ability to identify and utilize available resources and services will improve Outcome:  Progressing Goal: Ability to manage health-related needs will improve Outcome: Progressing   Problem: Metabolic: Goal: Ability to maintain appropriate glucose levels will improve Outcome: Progressing   Problem: Nutritional: Goal: Maintenance of adequate nutrition will improve Outcome: Progressing Goal: Progress toward achieving an optimal weight will improve Outcome: Progressing   Problem: Skin Integrity: Goal: Risk for impaired skin integrity will decrease Outcome: Progressing   Problem: Tissue Perfusion: Goal: Adequacy of tissue perfusion will improve Outcome: Progressing   Problem: Education: Goal: Understanding of CV disease, CV risk reduction, and recovery process will improve Outcome: Progressing Goal: Individualized Educational Video(s) Outcome: Progressing   Problem: Activity: Goal: Ability to return to baseline activity level will improve Outcome: Progressing   Problem: Cardiovascular: Goal: Ability to achieve and maintain adequate cardiovascular perfusion will improve Outcome: Progressing Goal: Vascular access site(s) Level 0-1 will be maintained Outcome: Progressing   Problem: Health Behavior/Discharge Planning: Goal: Ability to safely manage health-related needs after discharge will improve Outcome: Progressing

## 2023-04-23 NOTE — Assessment & Plan Note (Signed)
 Prior to 04-23-2023: -HR controlled, continue Coreg, amiodarone . CHADS-VASc of 4. Eliquis held secondary to bleeding from CVL recently. Heparin drip now held  04-23-2023 will restart IV heparin and see if he bleeds. If doesn't bleed in next 24 hours on IV heparin, can change back to Eliquis.

## 2023-04-23 NOTE — Assessment & Plan Note (Signed)
 Prior to 04-23-2023: Baseline creatinine appears to be 1.9-2.2.  Renal function has continued to progressively worsen. Nephrology following, decision increased renal biopsy due to high risk of bleeding and patient on Plavix with recent stent for PAD. Creatinine 7.26. IR consulted for [facility] and HD after placement  04-23-2023 pt had HD catheter placement yesterday and underwent first HD session.  Nephrology following. Will see if there is enough urine output/renal recovery over the next few days.

## 2023-04-23 NOTE — Progress Notes (Signed)
 Pt states he is tired this am d/t late HD session and does not want RLE dressing changed at this time. Will pass on to next shift.

## 2023-04-23 NOTE — Subjective & Objective (Signed)
 Pt seen and examined. No complaints. Had HD catheter placed yesterday and started HD.  Awaiting to see if he has renal recovery or will need long term HD.

## 2023-04-23 NOTE — Assessment & Plan Note (Signed)
 04-23-2023 stable. On synthroid 88 mcg daily.

## 2023-04-23 NOTE — Assessment & Plan Note (Signed)
 Originally admitted for influenza A on 03-27-2023.  Presented with 5 days of cough, fever, myalgia.  Respiratory virus panel positive for influenza A. Chest x-ray without acute infiltrate.  Improved with symptomatic treatment, was outside 48-hour window for Tamiflu hence was not initiated.  Now all resolved. Pt on RA.

## 2023-04-23 NOTE — Progress Notes (Signed)
 PROGRESS NOTE    Harold [name]  XBJ:[id number] DOB: 11-11-1955 DOA: 03/27/2023 PCP: [name], MD  Subjective: Pt seen and examined. No complaints. Had HD catheter placed yesterday and started HD.  Awaiting to see if he has renal recovery or will need long term HD.   Hospital Course: HPI:  Harold [name] is a 68 y.o. male with medical history significant for paroxysmal atrial fibrillation chronically anticoagulated on Eliquis, type 2 diabetes mellitus, essential hypertension, stage IV CKD with baseline creatinine 2.1-2.6, who is admitted to [facility] on 03/27/2023 with influenza A after presenting from home to [facility] ED complaining of cough.    The patient reports 5 days of new onset productive cough associate with subjective fever and generalized myalgias.  Denies any associated shortness of breath, orthopnea, or PND.  No associated any wheezing, chest pain, palpitations, diaphoresis, dizziness, presyncope, or syncope.  Over the last 5 days, he also notes onset generalized weakness in the absence of any acute focal weakness.  Denies any recent dysuria or gross hematuria.   In November 2024, he underwent ORIF of the right ankle, with ensuing revision in December 2024.  He has subsequently been followed by wound care at home.  However, over the course of the last week, he has noted increase in size of the wound over the lateral aspect of the right ankle, associate with some purulent drainage and increase in surrounding erythema, tenderness, swelling.  He conveys that home health wound care also noted worsening of this right lateral malleolus wound and surrounding erythema.  He has a history of diabetic peripheral polyneuropathy, but within this context, denies any acute focal numbness or paresthesias.  Denies any acute focal weakness.    ED Course:  Vital signs in the ED were notable for the following: Afebrile; heart's in the 60s to 70s; systolic pressures in the 150s to 180s;  respiratory rate 18-21, oxygen saturation 99-1 high percent on room air.   Labs were notable for the following: CMP notable for the following: Creatinine 2.73, glucose 241, calcium adjusted for mild hypoalbuminemia noted to be 10.1, albumin 2.3.  Otherwise, liver enzymes are within normal limits.  CBC notable for white cell count 7400, hemoglobin 14.1.  Influenza A a PCR is positive, will COVID, influenza B and RSV PCR were all negative.   Per my interpretation, EKG in ED demonstrated the following: No EKG performed in the ED today.   Imaging in the ED, per corresponding formal radiology read, was notable for the following: 2 view chest x-ray showed no evidence of acute cardiopulmonary process, including no evidence of infiltrate, Naima, effusion, or pneumothorax.  They films of the right tib-fib showed soft tissue swelling most pronounced over the lateral malleolus, all demonstrating no evidence of subcutaneous gas and no evidence of acute bony abnormality.   EDP d/w on-call ortho from [facility] (Dr. Carola Frost), who conveyed that the [facility] will formally consult and will see patient in the morning. Dr. Carola Frost requests that patient be made NPO at [facility] in case it is determined that a procedure is necessary.   While in the ED, the following were administered: Tessalon Perles 200 mg p.o. x 1 dose, Ancef 2 g IV x 1 dose.   Subsequently, the patient was admitted for further evaluation management of presenting influenza A infection as well as cellulitis of the right foot in the setting of concern for infected right diabetic foot wound.  Significant Events: Admitted 03/27/2023 for influenza A infection  Significant Labs: CMP notable for the following: Creatinine 2.73, glucose 241, calcium adjusted for mild hypoalbuminemia noted to be 10.1, albumin 2.3.  Otherwise, liver enzymes are within normal limits.  CBC notable for white cell count 7400, hemoglobin 14.1.  Influenza A a PCR is  positive, will COVID, influenza B and RSV PCR were all negative.  Significant Imaging Studies:   Antibiotic Therapy: Anti-infectives (From admission, onward)    Start     Dose/Rate Route Frequency Ordered Stop   04/22/23 1300  vancomycin (VANCOCIN) IVPB 1000 mg/200 mL premix        1,000 mg 200 mL/hr over 60 Minutes Intravenous To Radiology 04/22/23 1110 04/22/23 1849   04/19/23 1000  ertapenem (INVANZ) 500 mg in sodium chloride 0.9 % 50 mL IVPB  Status:  Discontinued        500 mg 100 mL/hr over 30 Minutes Intravenous Daily 04/18/23 0931 04/18/23 1106   04/17/23 1800  ertapenem (INVANZ) 1 g in sodium chloride 0.9 % 100 mL IVPB  Status:  Discontinued        1 g 200 mL/hr over 30 Minutes Intravenous Daily 04/17/23 1354 04/18/23 0931   04/08/23 0000  daptomycin (CUBICIN) IVPB  Status:  Discontinued        700 mg Intravenous Every 24 hours 04/08/23 1147 04/08/23    04/08/23 0000  ceFEPime (MAXIPIME) IVPB  Status:  Discontinued        2 g Intravenous Every 12 hours 04/08/23 1147 04/18/23    04/08/23 0000  daptomycin (CUBICIN) IVPB  Status:  Discontinued        700 mg Intravenous Every 24 hours 04/08/23 1150 04/18/23    04/04/23 1400  DAPTOmycin (CUBICIN) IVPB 700 mg/171mL premix  Status:  Discontinued        700 mg 200 mL/hr over 30 Minutes Intravenous Daily 04/03/23 1435 04/18/23 1106   04/02/23 1715  ceFAZolin (ANCEF) IVPB 2g/100 mL premix  Status:  Discontinued        2 g 200 mL/hr over 30 Minutes Intravenous Every 8 hours 04/02/23 1616 04/02/23 1623   04/02/23 0600  ceFAZolin (ANCEF) IVPB 3g/150 mL premix  Status:  Discontinued        3 g 300 mL/hr over 30 Minutes Intravenous On call to O.R. 04/01/23 1700 04/02/23 1549   03/31/23 1015  ceFAZolin (ANCEF) IVPB 2g/100 mL premix        2 g 200 mL/hr over 30 Minutes Intravenous On call to O.R. 03/31/23 0921 03/31/23 1203   03/29/23 2200  vancomycin (VANCOREADY) IVPB 2000 mg/400 mL  Status:  Discontinued       Placed in "Followed by"  Linked Group   2,000 mg 200 mL/hr over 120 Minutes Intravenous Every 48 hours 03/27/23 2257 04/03/23 1435   03/28/23 1045  metroNIDAZOLE (FLAGYL) tablet 500 mg  Status:  Discontinued        500 mg Oral Every 12 hours 03/28/23 0955 04/03/23 0945   03/27/23 2330  vancomycin (VANCOCIN) 2,500 mg in sodium chloride 0.9 % 500 mL IVPB       Placed in "Followed by" Linked Group   2,500 mg 262.5 mL/hr over 120 Minutes Intravenous  Once 03/27/23 2257 03/28/23 0241   03/27/23 2300  metroNIDAZOLE (FLAGYL) IVPB 500 mg  Status:  Discontinued        500 mg 100 mL/hr over 60 Minutes Intravenous Every 12 hours 03/27/23 2247 03/28/23 0955   03/27/23 2300  ceFEPIme (MAXIPIME) 2 g in sodium chloride 0.9 %  100 mL IVPB  Status:  Discontinued        2 g 200 mL/hr over 30 Minutes Intravenous Every 12 hours 03/27/23 2257 04/17/23 1354   03/27/23 2100  ceFAZolin (ANCEF) IVPB 2g/100 mL premix        2 g 200 mL/hr over 30 Minutes Intravenous  Once 03/27/23 2058 03/27/23 2219       Procedures: 03-31-2023 right ankle hardware removal 04-01-2023 right superficial femoral artery stenting 04-02-2023 right ankle surgical debridement 04-22-2023 HD catheter placement  Consultants: Vascular surgery Orthopedic surgery Infectious disease Nephrology Neurology IR    Assessment and Plan: * Influenza A Originally admitted for influenza A on 03-27-2023.  Presented with 5 days of cough, fever, myalgia.  Respiratory virus panel positive for influenza A. Chest x-ray without acute infiltrate.  Improved with symptomatic treatment, was outside 48-hour window for Tamiflu hence was not initiated.  Now all resolved. Pt on RA.  Acute kidney injury superimposed on stage 4 chronic kidney disease (HCC) Prior to 04-23-2023: Baseline creatinine appears to be 1.9-2.2.  Renal function has continued to progressively worsen. Nephrology following, decision increased renal biopsy due to high risk of bleeding and patient on Plavix with recent  stent for PAD. Creatinine 7.26. IR consulted for [facility] and HD after placement  04-23-2023 pt had HD catheter placement yesterday and underwent first HD session.  Nephrology following. Will see if there is enough urine output/renal recovery over the next few days.  Osteomyelitis (HCC) See "cellulitis of right lower extremity"  PAD (peripheral artery disease) (HCC) 04-23-2023 stable. On zetia. 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  Cellulitis of right lower extremity Prior to 04-23-2023: -Prior right ankle surgery (12/2022) complicated by postsurgical wound infection. S/p right ankle hardware removal, I&D and wound VAC placement on 2/10 by Dr. Jena Gauss. S/p repeat debridement of the right ankle by Dr. Lajoyce Corners 04/02/2023.  ID was consulted, initially on cefepime, subsequently on daptomycin and ertapenem, now antibiotics held due to worsening renal function.   04-23-2023 pt has been off IV abx for 3 days now.  ID is watching pt and will restart abx if needed. For now, watchful waiting.  Acquired hypothyroidism 04-23-2023 stable. On synthroid 88 mcg daily.  Paroxysmal atrial fibrillation (HCC) Prior to 04-23-2023: -HR controlled, continue Coreg, amiodarone . CHADS-VASc of 4. Eliquis held secondary to bleeding from CVL recently. Heparin drip now held  04-23-2023 will restart IV heparin and see if he bleeds. If doesn't bleed in next 24 hours on IV heparin, can change back to Eliquis.  Generalized weakness 04-23-2023 will need PT consult. May need SNF  OSA on CPAP 04-23-2023 prn cpap.  Essential hypertension 04-23-2023 BP stable. On coreg 3.125 mg bid. Has PRN IV hydralazine. His volume status will be managed by HD for now.  Obesity,  Class II, BMI 35-39.9 Estimated body mass index is 39.18 kg/m as calculated from the following:   Height as of this encounter: 6\' 1"  (1.854 m).   Weight as of this encounter: 134.7 kg.     DVT prophylaxis: Place and maintain sequential compression device Start: 04/15/23 1549 SCDs Start: 04/02/23 1617  IV Heparin   Code Status: Full Code Family Communication: no family at bedside Disposition Plan: will likely need SNF Reason  for continuing need for hospitalization: starting IV heparin. Remains on HD  Objective: Vitals:   04/23/23 0736 04/23/23 1100 04/23/23 1608 04/23/23 1642  BP: (!) 148/69 (!) 112/51  126/64  Pulse: 61 66 (!) 57 (!) 57  Resp: 16 16  16   Temp: 97.9 F (36.6 C) 98 F (36.7 C)  98.2 F (36.8 C)  TempSrc: Oral Oral  Oral  SpO2: 96% 94%  97%  Weight:      Height:        Intake/Output Summary (Last 24 hours) at 04/23/2023 1722 Last data filed at 04/23/2023 0045 Gross per 24 hour  Intake --  Output 900 ml  Net -900 ml   Filed Weights   04/21/23 0420 04/22/23 0500 04/23/23 0307  Weight: 134.7 kg 135.5 kg 134.7 kg    Examination:  Physical Exam Vitals and nursing note reviewed.  Constitutional:      Appearance: He is obese.  HENT:     Head: Normocephalic and atraumatic.     Nose: Nose normal.  Eyes:     General: No scleral icterus. Cardiovascular:     Rate and Rhythm: Normal rate and regular rhythm.  Pulmonary:     Effort: Pulmonary effort is normal.     Breath sounds: Normal breath sounds.  Abdominal:     General: Bowel sounds are normal.     Palpations: Abdomen is soft.  Musculoskeletal:     Comments: Right lower leg in brace/bulky dressing  Skin:    General: Skin is warm and dry.     Capillary Refill: Capillary refill takes less than 2 seconds.     Comments: Right Eagle powerline Left internal jugular HD catheter  Neurological:     Mental Status: He is alert and oriented to person, place, and time.     Data Reviewed: I have personally  reviewed following labs and imaging studies  CBC: Recent Labs  Lab 04/19/23 0531 04/20/23 0619 04/21/23 0525 04/22/23 0612 04/23/23 0557  WBC 14.1* 12.1* 8.7 6.7 4.9  HGB 8.1* 7.4* 7.7* 7.8* 7.7*  HCT 23.8* 22.0* 22.6* 22.9* 22.2*  MCV 87.8 88.0 87.3 86.4 84.4  PLT 134* 143* 123* 132* 136*   Basic Metabolic Panel: Recent Labs  Lab 04/19/23 0531 04/20/23 0619 04/21/23 1042 04/22/23 0612 04/22/23 0614 04/23/23 0558  NA 133* 134* 131*  --  131* 130*  K 3.0* 3.5 3.6  --  3.6 3.5  CL 100 98 97*  --  96* 96*  CO2 17* 17* 19*  --  18* 22  GLUCOSE 172* 214* 185*  --  178* 130*  BUN 39* 48* 64*  --  77* 72*  CREATININE 3.56* 4.62* 6.34*  --  7.26* 7.18*  CALCIUM 9.0 9.2 8.5*  --  8.8* 8.3*  MG  --   --   --  2.0  --   --   PHOS 3.2 3.8 5.0*  --  5.3* 4.6   GFR: Estimated Creatinine Clearance: 14 mL/min (A) (by C-G formula based on SCr of 7.18 mg/dL (H)). Liver Function Tests: Recent Labs  Lab 04/19/23 0531 04/20/23 0619 04/21/23 1042 04/22/23 0614 04/23/23 0558  ALBUMIN 3.0* 2.7* 2.5* 2.4* 2.2*   Cardiac Enzymes: Recent Labs  Lab 04/18/23 0542  CKTOTAL 348   BNP (last 3 results) Recent Labs    04/17/23 1217  BNP 765.1*   CBG: Recent Labs  Lab 04/22/23 1157 04/22/23 1809 04/23/23 0128 04/23/23 0630 04/23/23 1203  GLUCAP 180* 177* 130* 121* 148*  Sepsis Labs: Recent Labs  Lab 04/17/23 1217  PROCALCITON 0.18   Radiology Studies: IR Fluoro Guide CV Line Left Result Date: 04/22/2023 INDICATION: 35 year old with acute kidney injury. Patient needs a dialysis catheter. EXAM: FLUOROSCOPIC AND ULTRASOUND GUIDED PLACEMENT OF A TUNNELED DIALYSIS CATHETER Physician: Rachelle Hora. Lowella Dandy, MD MEDICATIONS: Vancomycin 1 g; The antibiotic was administered within an appropriate time interval prior to skin puncture. ANESTHESIA/SEDATION: Moderate (conscious) sedation was employed during this procedure. A total of Versed 1.0mg  and fentanyl 50 mcg was administered intravenously  at the order of the provider performing the procedure. Total intra-service moderate sedation time: 20 minutes. Patient's level of consciousness and vital signs were monitored continuously by radiology nurse throughout the procedure under the supervision of the provider performing the procedure. FLUOROSCOPY TIME:  Radiation Exposure Index (as provided by the fluoroscopic device): 8 mGy Kerma COMPLICATIONS: None immediate. PROCEDURE: Informed consent was obtained for placement of a tunneled dialysis catheter. The patient was placed supine on the interventional table. Ultrasound confirmed a patent left internal jugular vein. Ultrasound image obtained for documentation. The left neck and chest was prepped and draped in a sterile fashion. Maximal barrier sterile technique was utilized including caps, mask, sterile gowns, sterile gloves, sterile drape, hand hygiene and skin antiseptic. The left neck was anesthetized with 1% lidocaine. A small incision was made with #11 blade scalpel. A 21 gauge needle directed into the left internal jugular vein with ultrasound guidance. A micropuncture dilator set was placed. A 28 cm tip to cuff Palindrome catheter was selected. The skin below the left clavicle was anesthetized and a small incision was made with an #11 blade scalpel. A subcutaneous tunnel was formed to the vein dermatotomy site. The catheter was brought through the tunnel. The vein dermatotomy site was dilated to accommodate a peel-away sheath over a wire. The catheter was placed through the peel-away sheath and directed into the central venous structures. The tip of the catheter was placed at superior cavoatrial junction with fluoroscopy. Fluoroscopic images were obtained for documentation. Both lumens were found to aspirate and flush well. The proper amount of heparin was flushed in both lumens. The vein dermatotomy site was closed using a single layer of absorbable suture and Dermabond. The catheter was secured to the  skin using Prolene suture. IMPRESSION: Successful placement of a left jugular tunneled dialysis catheter using ultrasound and fluoroscopic guidance. Electronically Signed   By: Richarda Overlie M.D.   On: 04/22/2023 15:05   IR US Guide Vasc Access Left Result Date: 04/22/2023 INDICATION: 51 year old with acute kidney injury. Patient needs a dialysis catheter. EXAM: FLUOROSCOPIC AND ULTRASOUND GUIDED PLACEMENT OF A TUNNELED DIALYSIS CATHETER Physician: Rachelle Hora. Lowella Dandy, MD MEDICATIONS: Vancomycin 1 g; The antibiotic was administered within an appropriate time interval prior to skin puncture. ANESTHESIA/SEDATION: Moderate (conscious) sedation was employed during this procedure. A total of Versed 1.0mg  and fentanyl 50 mcg was administered intravenously at the order of the provider performing the procedure. Total intra-service moderate sedation time: 20 minutes. Patient's level of consciousness and vital signs were monitored continuously by radiology nurse throughout the procedure under the supervision of the provider performing the procedure. FLUOROSCOPY TIME:  Radiation Exposure Index (as provided by the fluoroscopic device): 8 mGy Kerma COMPLICATIONS: None immediate. PROCEDURE: Informed consent was obtained for placement of a tunneled dialysis catheter. The patient was placed supine on the interventional table. Ultrasound confirmed a patent left internal jugular vein. Ultrasound image obtained for documentation. The left neck and chest was prepped and draped in  a sterile fashion. Maximal barrier sterile technique was utilized including caps, mask, sterile gowns, sterile gloves, sterile drape, hand hygiene and skin antiseptic. The left neck was anesthetized with 1% lidocaine. A small incision was made with #11 blade scalpel. A 21 gauge needle directed into the left internal jugular vein with ultrasound guidance. A micropuncture dilator set was placed. A 28 cm tip to cuff Palindrome catheter was selected. The skin below the  left clavicle was anesthetized and a small incision was made with an #11 blade scalpel. A subcutaneous tunnel was formed to the vein dermatotomy site. The catheter was brought through the tunnel. The vein dermatotomy site was dilated to accommodate a peel-away sheath over a wire. The catheter was placed through the peel-away sheath and directed into the central venous structures. The tip of the catheter was placed at superior cavoatrial junction with fluoroscopy. Fluoroscopic images were obtained for documentation. Both lumens were found to aspirate and flush well. The proper amount of heparin was flushed in both lumens. The vein dermatotomy site was closed using a single layer of absorbable suture and Dermabond. The catheter was secured to the skin using Prolene suture. IMPRESSION: Successful placement of a left jugular tunneled dialysis catheter using ultrasound and fluoroscopic guidance. Electronically Signed   By: Richarda Overlie M.D.   On: 04/22/2023 15:05    Scheduled Meds:  allopurinol  50 mg Oral Daily   amiodarone  200 mg Oral BID   [START ON 04/24/2023] atorvastatin  40 mg Oral Daily   carvedilol  3.125 mg Oral BID WC   Chlorhexidine Gluconate Cloth  6 each Topical Q0600   clopidogrel  75 mg Oral Q breakfast   docusate sodium  100 mg Oral BID   DULoxetine  60 mg Oral Daily   ezetimibe  10 mg Oral Daily   gabapentin  300 mg Oral QHS   guaiFENesin  600 mg Oral BID   insulin aspart  0-9 Units Subcutaneous Q6H   insulin glargine  40 Units Subcutaneous Daily   levothyroxine  88 mcg Oral QAC breakfast   polyethylene glycol  17 g Oral Daily   sodium chloride flush  3 mL Intravenous Q12H   sodium chloride flush  3-10 mL Intravenous Q12H   tamsulosin  0.4 mg Oral Daily   Continuous Infusions:  heparin 1,200 Units/hr (04/23/23 1612)     LOS: 27 days   Time spent: 50 minutes  Carollee Herter, DO  [facility]  04/23/2023, 5:22 PM

## 2023-04-23 NOTE — Progress Notes (Signed)
 Physical Therapy Treatment Patient Details Name: Harold [name] MRN: [id number] DOB: [birth year] Today's Date: 04/23/2023   History of Present Illness 68 y.o. male presents to [facility] 03/27/23 with productive cough and generalized weakness. Admitted with influenza A and cellulitis of R foot s/p hardware removal of the R ankle and application of wound vac on 2/10, wound vac removed 2/27. Left thigh pain, CT negative. Prior admit 11/24 for R ankle ORIF and in 12/24 for revision. S/p excisional debridement of right ankle on 2/12. Pt underwent central line placement on 04/07/2023 however he has been having issues with recurrent bleeding from the insertion site which has finally stopped. 04/17/23 pt with drop in sats requiring 2 L of O2 via [place], chest xray concerning for edema or possibly pneumonia.Significant PMH: HTN, HLD, CAD s/p PCI, COPD, DM2, CKD stage IIIb, PVD, gout, OSA, neuropathy, COPD, a-fib, CAD s/p percutaneous coronary angioplasty, angina, obesity    PT Comments  STAR PT/OT Session: Pt awake and alert on arrival and eager for mobility. Pt making steady gains towards acute PT goals this session requiring less assist to complete bed mobility. Pt continues to require mod A +2 to boost to stand with RW x1 during session, however unable to progress stepping or pivot to progress transfer EOB>chair. Pt able to come to stand in stedy frame from EOB and recliner with mod A +2 for transfer to chair with improved posture in stedy frame vs RW. Pt with noted posterior and L lateral lean in sitting at start of session due to R hip pain in sitting. Patient to continue to be followed by acute PT with STAR program to address established goals.    If plan is discharge home, recommend the following: A lot of help with walking and/or transfers;A lot of help with bathing/dressing/bathroom;Assistance with cooking/housework;Assist for transportation;Help with stairs or ramp for entrance   Can travel by private vehicle      Yes  Equipment Recommendations  BSC/3in1 (Bariatric; Pt has a tub bench, w/c, RW, and sliding board already)    Recommendations for Other Services       Precautions / Restrictions Precautions Precautions: Fall Recall of Precautions/Restrictions: Impaired Precaution/Restrictions Comments: Watch BP Required Braces or Orthoses: Other Brace Other Brace: R CAM boot for ambulation, R splint in bed, abdominal binder (Prn) Restrictions Weight Bearing Restrictions Per Provider Order: Yes RLE Weight Bearing Per Provider Order: Weight bearing as tolerated Other Position/Activity Restrictions: per Dr. Audrie Lia note 04/03/2023 and orders 04/03/2023 pt WBAT     Mobility  Bed Mobility Overal bed mobility: Needs Assistance Bed Mobility: Supine to Sit     Supine to sit: Mod assist, +2 for physical assistance     General bed mobility comments: increased time and assistance with BLEs and trunk, bed pad used to assist with scooting hips forward    Transfers Overall transfer level: Needs assistance Equipment used: Rolling walker (2 wheels), Ambulation equipment used Transfers: Sit to/from Stand, Bed to chair/wheelchair/BSC Sit to Stand: Mod assist, +2 physical assistance, From elevated surface           General transfer comment: Patient required mod assist +2 to stand from EOB to RW. Patient attempted transfer to recliner but was unsafe and had difficulty with stepping towards recliner. Stedy used for transfer with mod assist +2 to stand from EOB and min assist from [employer] via Lift Equipment: Stedy  Ambulation/Gait               General Gait Details:  pt unable to progress gait this session   Stairs             Wheelchair Mobility     Tilt Bed    Modified Rankin (Stroke Patients Only)       Balance Overall balance assessment: Needs assistance Sitting-balance support: Feet supported, Bilateral upper extremity supported Sitting balance-Leahy Scale:  Poor Sitting balance - Comments: patient required assistance seated on EOB due to posterior leaning which patient stated was due to hip pain Postural control: Posterior lean Standing balance support: Bilateral upper extremity supported, During functional activity, Reliant on assistive device for balance Standing balance-Leahy Scale: Poor Standing balance comment: reliant on external device for support                            Communication Communication Communication: No apparent difficulties  Cognition Arousal: Alert Behavior During Therapy: Flat affect                             Following commands: Impaired Following commands impaired: Follows one step commands with increased time    Cueing Cueing Techniques: Verbal cues, Tactile cues  Exercises General Exercises - Lower Extremity Ankle Circles/Pumps: AROM, Both, 10 reps, Seated Quad Sets: AROM, Both, 5 reps, Seated Heel Slides: AROM, Both, 5 reps, Seated    General Comments General comments (skin integrity, edema, etc.): BP stable throughout all positional changes      Pertinent Vitals/Pain Pain Assessment Pain Assessment: Faces Faces Pain Scale: Hurts even more Pain Location: right hip when sitting on EOB Pain Descriptors / Indicators: Discomfort, Guarding Pain Intervention(s): Limited activity within patient's tolerance, Monitored during session, Repositioned    Home Living                          Prior Function            PT Goals (current goals can now be found in the care plan section) Acute Rehab PT Goals PT Goal Formulation: With patient Time For Goal Achievement: 05/01/23 Progress towards PT goals: Progressing toward goals    Frequency    Min 1X/week      PT Plan      Co-evaluation PT/OT/SLP Co-Evaluation/Treatment: Yes Reason for Co-Treatment: To address functional/ADL transfers;For patient/therapist safety;Other (comment) (activity tolerance) PT goals  addressed during session: Mobility/safety with mobility;Proper use of DME;Balance OT goals addressed during session: ADL's and self-care      AM-PAC PT "6 Clicks" Mobility   Outcome Measure  Help needed turning from your back to your side while in a flat bed without using bedrails?: A Little Help needed moving from lying on your back to sitting on the side of a flat bed without using bedrails?: A Lot Help needed moving to and from a bed to a chair (including a wheelchair)?: A Lot Help needed standing up from a chair using your arms (e.g., wheelchair or bedside chair)?: A Lot Help needed to walk in hospital room?: Total Help needed climbing 3-5 steps with a railing? : Total 6 Click Score: 11    End of Session Equipment Utilized During Treatment: Gait belt Activity Tolerance: Patient limited by fatigue Patient left: in chair;with call bell/phone within reach;with chair alarm set Nurse Communication: Need for lift equipment;Mobility status (stedy for back to bed) PT Visit Diagnosis: Other abnormalities of gait and mobility (R26.89);Unsteadiness on feet (R26.81);Muscle weakness (generalized) (  M62.81);History of falling (Z91.81);Difficulty in walking, not elsewhere classified (R26.2)     Time: 1041-1110 PT Time Calculation (min) (ACUTE ONLY): 29 min  Charges:    $Therapeutic Activity: 8-22 mins PT General Charges $$ ACUTE PT VISIT: 1 Visit                     Quamel Fitzmaurice R. PTA Acute Rehabilitation Services Office: [phone]   Catalina Antigua 04/23/2023, 3:42 PM

## 2023-04-23 NOTE — Assessment & Plan Note (Signed)
 Prior to 04-23-2023: -Prior right ankle surgery (12/2022) complicated by postsurgical wound infection. S/p right ankle hardware removal, I&D and wound VAC placement on 2/10 by Dr. Jena Gauss. S/p repeat debridement of the right ankle by Dr. Lajoyce Corners 04/02/2023.  ID was consulted, initially on cefepime, subsequently on daptomycin and ertapenem, now antibiotics held due to worsening renal function.   04-23-2023 pt has been off IV abx for 3 days now.  ID is watching pt and will restart abx if needed. For now, watchful waiting.

## 2023-04-23 NOTE — Assessment & Plan Note (Signed)
 04-23-2023 BP stable. On coreg 3.125 mg bid. Has PRN IV hydralazine. His volume status will be managed by HD for now.

## 2023-04-24 ENCOUNTER — Other Ambulatory Visit (HOSPITAL_COMMUNITY): Payer: Self-pay

## 2023-04-24 DIAGNOSIS — Z992 Dependence on renal dialysis: Secondary | ICD-10-CM

## 2023-04-24 DIAGNOSIS — L03119 Cellulitis of unspecified part of limb: Secondary | ICD-10-CM | POA: Diagnosis not present

## 2023-04-24 DIAGNOSIS — E11628 Type 2 diabetes mellitus with other skin complications: Secondary | ICD-10-CM | POA: Diagnosis not present

## 2023-04-24 DIAGNOSIS — Z794 Long term (current) use of insulin: Secondary | ICD-10-CM

## 2023-04-24 DIAGNOSIS — J101 Influenza due to other identified influenza virus with other respiratory manifestations: Secondary | ICD-10-CM | POA: Diagnosis not present

## 2023-04-24 DIAGNOSIS — L97314 Non-pressure chronic ulcer of right ankle with necrosis of bone: Secondary | ICD-10-CM | POA: Diagnosis not present

## 2023-04-24 DIAGNOSIS — L02415 Cutaneous abscess of right lower limb: Secondary | ICD-10-CM | POA: Diagnosis not present

## 2023-04-24 DIAGNOSIS — N179 Acute kidney failure, unspecified: Secondary | ICD-10-CM | POA: Diagnosis not present

## 2023-04-24 DIAGNOSIS — M86171 Other acute osteomyelitis, right ankle and foot: Secondary | ICD-10-CM | POA: Diagnosis not present

## 2023-04-24 LAB — RENAL FUNCTION PANEL
Albumin: 2.2 g/dL — ABNORMAL LOW (ref 3.5–5.0)
Anion gap: 15 (ref 5–15)
BUN: 82 mg/dL — ABNORMAL HIGH (ref 8–23)
CO2: 19 mmol/L — ABNORMAL LOW (ref 22–32)
Calcium: 8.3 mg/dL — ABNORMAL LOW (ref 8.9–10.3)
Chloride: 97 mmol/L — ABNORMAL LOW (ref 98–111)
Creatinine, Ser: 8.14 mg/dL — ABNORMAL HIGH (ref 0.61–1.24)
GFR, Estimated: 7 mL/min — ABNORMAL LOW (ref >60)
Glucose, Bld: 184 mg/dL — ABNORMAL HIGH (ref 70–99)
Phosphorus: 5.1 mg/dL — ABNORMAL HIGH (ref 2.5–4.6)
Potassium: 3.6 mmol/L (ref 3.5–5.1)
Sodium: 131 mmol/L — ABNORMAL LOW (ref 135–145)

## 2023-04-24 LAB — GLUCOSE, CAPILLARY
Glucose-Capillary: 167 mg/dL — ABNORMAL HIGH (ref 70–99)
Glucose-Capillary: 184 mg/dL — ABNORMAL HIGH (ref 70–99)
Glucose-Capillary: 121 mg/dL — ABNORMAL HIGH (ref 70–99)
Glucose-Capillary: 282 mg/dL — ABNORMAL HIGH (ref 70–99)

## 2023-04-24 LAB — CBC
HCT: 22.5 % — ABNORMAL LOW (ref 39.0–52.0)
Hemoglobin: 7.8 g/dL — ABNORMAL LOW (ref 13.0–17.0)
MCH: 29.4 pg (ref 26.0–34.0)
MCHC: 34.7 g/dL (ref 30.0–36.0)
MCV: 84.9 fL (ref 80.0–100.0)
Platelets: 136 10*3/uL — ABNORMAL LOW (ref 150–400)
RBC: 2.65 MIL/uL — ABNORMAL LOW (ref 4.22–5.81)
RDW: 15.8 % — ABNORMAL HIGH (ref 11.5–15.5)
WBC: 5.2 10*3/uL (ref 4.0–10.5)
nRBC: 0 % (ref 0.0–0.2)

## 2023-04-24 LAB — UPEP/UIFE/LIGHT CHAINS/TP, 24-HR UR
Free Kappa Lt Chains,Ur: 586.82 mg/L — ABNORMAL HIGH (ref 1.17–86.46)
Free Kappa/Lambda Ratio: 2.92 (ref 1.83–14.26)
Free Lambda Lt Chains,Ur: 200.91 mg/L — ABNORMAL HIGH (ref 0.27–15.21)
Total Protein, Urine: 1142.3 mg/dL

## 2023-04-24 LAB — HEPARIN LEVEL (UNFRACTIONATED)
Heparin Unfractionated: 0.25 [IU]/mL — ABNORMAL LOW (ref 0.30–0.70)
Heparin Unfractionated: 0.28 [IU]/mL — ABNORMAL LOW (ref 0.30–0.70)

## 2023-04-24 LAB — APTT: aPTT: 54 s — ABNORMAL HIGH (ref 24–36)

## 2023-04-24 LAB — SEDIMENTATION RATE: Sed Rate: 118 mm/h — ABNORMAL HIGH (ref 0–16)

## 2023-04-24 LAB — C-REACTIVE PROTEIN: CRP: 16.1 mg/dL — ABNORMAL HIGH (ref <1.0)

## 2023-04-24 MED ORDER — HEPARIN SODIUM (PORCINE) 1000 UNIT/ML IJ SOLN
4200.0000 [IU] | Freq: Once | INTRAMUSCULAR | Status: AC
  Administered 2023-04-24: 4200 [IU]
  Filled 2023-04-24: qty 5

## 2023-04-24 MED ORDER — ANTICOAGULANT SODIUM CITRATE 4% (200MG/5ML) IV SOLN: 5.0000 mL | Status: DC | PRN

## 2023-04-24 MED ORDER — ALTEPLASE 2 MG IJ SOLR: 2.0000 mg | Freq: Once | INTRAMUSCULAR | Status: DC | PRN

## 2023-04-24 MED ORDER — HEPARIN SODIUM (PORCINE) 1000 UNIT/ML DIALYSIS: 1000.0000 [IU] | INTRAMUSCULAR | Status: DC | PRN

## 2023-04-24 NOTE — Progress Notes (Signed)
 Occupational Therapy Treatment Patient Details Name: Harold [name] MRN: [id number] DOB: 1955-08-13 Today's Date: 04/24/2023   History of present illness 68 y.o. male presents to [facility] 03/27/23 with productive cough and generalized weakness. Admitted with influenza A and cellulitis of R foot s/p hardware removal of the R ankle and application of wound vac on 2/10, wound vac removed 2/27. Left thigh pain, CT negative. Prior admit 11/24 for R ankle ORIF and in 12/24 for revision. S/p excisional debridement of right ankle on 2/12. Pt underwent central line placement on 04/07/2023 however he has been having issues with recurrent bleeding from the insertion site which has finally stopped. 04/17/23 pt with drop in sats requiring 2 L of O2 via [place], chest xray concerning for edema or possibly pneumonia.Significant PMH: HTN, HLD, CAD s/p PCI, COPD, DM2, CKD stage IIIb, PVD, gout, OSA, neuropathy, COPD, a-fib, CAD s/p percutaneous coronary angioplasty, angina, obesity   OT comments  STAR Patient OT/PT session: Patient received in supine and eager to participate with therapy. Patient continues to require assist of 2 for supine to sitting on EOB but with increased patient participation. Patient asking to perform self care at sink and agreeable to perform from [facility]. Patient stood at stedy and had complaints of dizziness and BP 90/46(58). Self care performed  seated on EOB and stood for peri area bathing with limited standing tolerance while standing in [place]. Patient performed oral care seated on EOB but completed grooming in supine due to fatigue. Patient requiring assistance for sitting balance on EOB due to posterior leaning due to patient attempting to adjust self for comfort and due to left hip pain. Patient instructed that leaning back will cause hips to scoot towards EOB and to avoid for safety. Patient to continue to be followed by acute OT with STAR program to address established goals to facilitate DC to next  venue of care.       If plan is discharge home, recommend the following:  A lot of help with walking and/or transfers;A lot of help with bathing/dressing/bathroom;Assistance with cooking/housework;Help with stairs or ramp for entrance;Assist for transportation   Equipment Recommendations  Other (comment) (bariatric BSC)    Recommendations for Other Services      Precautions / Restrictions Precautions Precautions: Fall Recall of Precautions/Restrictions: Impaired Precaution/Restrictions Comments: Watch BP Required Braces or Orthoses: Other Brace Other Brace: R CAM boot for ambulation, R splint in bed, abdominal binder (Prn) Restrictions Weight Bearing Restrictions Per Provider Order: Yes RLE Weight Bearing Per Provider Order: Weight bearing as tolerated Other Position/Activity Restrictions: per Dr. Audrie Lia note 04/03/2023 and orders 04/03/2023 pt WBAT       Mobility Bed Mobility Overal bed mobility: Needs Assistance Bed Mobility: Supine to Sit, Sit to Supine     Supine to sit: Mod assist, +2 for physical assistance Sit to supine: Mod assist, +2 for physical assistance   General bed mobility comments: patient demonstrating increased participation but continues to require mod assist +2 to perform due to weakness and fatigue    Transfers Overall transfer level: Needs assistance Equipment used: Ambulation equipment used Transfers: Sit to/from Stand Sit to Stand: Mod assist, +2 physical assistance, From elevated surface           General transfer comment: peformed standing in stedy with BP dropping while standing and patient complaining of dizziness     Balance Overall balance assessment: Needs assistance Sitting-balance support: Feet supported, Bilateral upper extremity supported Sitting balance-Leahy Scale: Poor Sitting balance - Comments: patient demonstrating  posterior leaning while on EOB, often to relieve pressure from buttocks with patient requiring min to CGA for  balance Postural control: Posterior lean Standing balance support: Bilateral upper extremity supported, During functional activity, Reliant on assistive device for balance Standing balance-Leahy Scale: Poor Standing balance comment: stood in [place] with patient reliant on Stedy for support                           ADL either performed or assessed with clinical judgement   ADL Overall ADL's : Needs assistance/impaired     Grooming: Wash/dry hands;Wash/dry face;Oral care;Contact guard assist;Minimal assistance;Sitting Grooming Details (indicate cue type and reason): performed seated on EOB with min to CGA for sitting balance. Performed oral care seated on EOB and face washing at bed level due to fatigue from sitting on EOB Upper Body Bathing: Minimal assistance;Sitting Upper Body Bathing Details (indicate cue type and reason): assistance for back Lower Body Bathing: Moderate assistance;Maximal assistance;Sitting/lateral leans;Sit to/from stand Lower Body Bathing Details (indicate cue type and reason): patient able to bathe top of legs and required assistance for below knees and bottom when standing Upper Body Dressing : Sitting;Minimal assistance Upper Body Dressing Details (indicate cue type and reason): donned gown with min assist due to lines Lower Body Dressing: Total assistance;Bed level Lower Body Dressing Details (indicate cue type and reason): donned CAM boot and splint for RLE               General ADL Comments: patient performed self care tasks seated on EOB due to orthostatic when standing    Extremity/Trunk Assessment              Vision       Perception     Praxis     Communication Communication Communication: No apparent difficulties   Cognition Arousal: Alert Behavior During Therapy: Flat affect Cognition: No apparent impairments             OT - Cognition Comments: patient presenting poor safety seated on EOB, required cues to not  lean back                 Following commands: Impaired Following commands impaired: Follows one step commands with increased time      Cueing   Cueing Techniques: Verbal cues, Tactile cues  Exercises      Shoulder Instructions       General Comments BP while standing in stedy 90/46 (58)    Pertinent Vitals/ Pain       Pain Assessment Pain Assessment: Faces Faces Pain Scale: Hurts even more Pain Location: left hip when sitting on EOB Pain Descriptors / Indicators: Discomfort, Guarding Pain Intervention(s): Limited activity within patient's tolerance, Monitored during session, Repositioned  Home Living                                          Prior Functioning/Environment              Frequency  Min 1X/week        Progress Toward Goals  OT Goals(current goals can now be found in the care plan section)  Progress towards OT goals: Progressing toward goals  Acute Rehab OT Goals Patient Stated Goal: get stronger OT Goal Formulation: With patient Time For Goal Achievement: 05/01/23 Potential to Achieve Goals: Good ADL Goals Pt Will Perform Grooming:  with set-up;sitting (wash face, mouth care) Pt Will Perform Lower Body Dressing: with min assist;with adaptive equipment;with caregiver independent in assisting;sitting/lateral leans;sit to/from stand Pt Will Transfer to Toilet: stand pivot transfer;with min assist;bedside commode Pt Will Perform Toileting - Clothing Manipulation and hygiene: with min assist;sitting/lateral leans;sit to/from stand Pt/caregiver will Perform Home Exercise Program: Increased strength;Both right and left upper extremity;With theraband;With written HEP provided Additional ADL Goal #1: Pt will be Mod I in and OOB for basic ADLs  Plan      Co-evaluation    PT/OT/SLP Co-Evaluation/Treatment: Yes Reason for Co-Treatment: To address functional/ADL transfers;For patient/therapist safety;Other (comment) (activity  tolerance) PT goals addressed during session: Mobility/safety with mobility;Proper use of DME;Balance OT goals addressed during session: ADL's and self-care      AM-PAC OT "6 Clicks" Daily Activity     Outcome Measure   Help from another person eating meals?: None Help from another person taking care of personal grooming?: A Little Help from another person toileting, which includes using toliet, bedpan, or urinal?: A Lot Help from another person bathing (including washing, rinsing, drying)?: A Lot Help from another person to put on and taking off regular upper body clothing?: A Little Help from another person to put on and taking off regular lower body clothing?: Total 6 Click Score: 15    End of Session Equipment Utilized During Treatment: Gait belt;Other (comment) Antony Salmon)  OT Visit Diagnosis: Unsteadiness on feet (R26.81);Other abnormalities of gait and mobility (R26.89);Muscle weakness (generalized) (M62.81);Pain Pain - Right/Left: Left Pain - part of body: Hip   Activity Tolerance Patient limited by fatigue   Patient Left in bed;with call bell/phone within reach;with bed alarm set   Nurse Communication Mobility status        Time: 9629-5284 OT Time Calculation (min): 42 min  Charges: OT General Charges $OT Visit: 1 Visit OT Treatments $Self Care/Home Management : 23-37 mins  Alfonse Flavors, OTA Acute Rehabilitation Services  Office [phone]   Dewain Penning 04/24/2023, 2:12 PM

## 2023-04-24 NOTE — Progress Notes (Signed)
 Admit: 03/27/2023 LOS: 28  41M with AKI on CKD3 likely from ATN  Subjective:  Oliguric, creatinine and BUN continue to rise Will receive dialysis #2  today No specific complaints today other than he wants to go home   03/05 0701 - 03/06 0700 In: -  Out: 600 [Urine:600]  Filed Weights   04/22/23 0500 04/23/23 0307 04/24/23 0341  Weight: 135.5 kg 134.7 kg 134 kg    Scheduled Meds:  allopurinol  50 mg Oral Daily   amiodarone  200 mg Oral BID   atorvastatin  40 mg Oral Daily   carvedilol  3.125 mg Oral BID WC   Chlorhexidine Gluconate Cloth  6 each Topical Q0600   clopidogrel  75 mg Oral Q breakfast   docusate sodium  100 mg Oral BID   DULoxetine  60 mg Oral Daily   ezetimibe  10 mg Oral Daily   gabapentin  300 mg Oral QHS   guaiFENesin  600 mg Oral BID   insulin aspart  0-9 Units Subcutaneous Q6H   insulin glargine  40 Units Subcutaneous Daily   levothyroxine  88 mcg Oral QAC breakfast   polyethylene glycol  17 g Oral Daily   sodium chloride flush  3 mL Intravenous Q12H   sodium chloride flush  3-10 mL Intravenous Q12H   tamsulosin  0.4 mg Oral Daily   Continuous Infusions:  heparin 1,350 Units/hr (04/24/23 0513)    PRN Meds:.acetaminophen **OR** acetaminophen, calcium carbonate, hydrALAZINE, melatonin, methocarbamol **OR** methocarbamol (ROBAXIN) injection, metoCLOPramide **OR** metoCLOPramide (REGLAN) injection, naLOXone (NARCAN)  injection, ondansetron (ZOFRAN) IV, sodium chloride flush, sodium chloride flush  Current Labs: reviewed   Physical Exam:  Blood pressure (!) 155/65, pulse (!) 57, temperature 97.9 F (36.6 C), temperature source Axillary, resp. rate 18, height 6\' 1"  (1.854 m), weight 134 kg, SpO2 100%. NAD, awake, alert, conversant Regular, normal S1 and S2 Clear bilaterally Lower extremity edema is trace, ankle hardware present [facility] c/d/i  A Dialysis dependent Anuric AKI on CKD 3; ATN and AIN both possible Hypertension with tendency towards  orthostasis on low-dose carvedilol and tamsulosin Nephrotic range proteinuria with negative serological evaluation but decision against biopsy at the current time per previous notes Right lower extremity infection with involvement of orthopedic hardware status post removal and antibiotics discontinued 2/28 with concern for AIN Chronic anemia, hemoglobin in the 7s Mild hyponatremia, dialysis will address Increased anion gap metabolic acidosis  P HD #2 today Initiate CLIP as AKI Medication Issues; Preferred narcotic agents for pain control are hydromorphone, fentanyl, and methadone. Morphine should not be used.  Baclofen should be avoided Avoid oral sodium phosphate and magnesium citrate based laxatives / bowel preps    Sabra Heck MD 04/24/2023, 11:29 AM  Recent Labs  Lab 04/22/23 0614 04/23/23 0558 04/24/23 0400  NA 131* 130* 131*  K 3.6 3.5 3.6  CL 96* 96* 97*  CO2 18* 22 19*  GLUCOSE 178* 130* 184*  BUN 77* 72* 82*  CREATININE 7.26* 7.18* 8.14*  CALCIUM 8.8* 8.3* 8.3*  PHOS 5.3* 4.6 5.1*   Recent Labs  Lab 04/22/23 0612 04/23/23 0557 04/24/23 0400  WBC 6.7 4.9 5.2  HGB 7.8* 7.7* 7.8*  HCT 22.9* 22.2* 22.5*  MCV 86.4 84.4 84.9  PLT 132* 136* 136*

## 2023-04-24 NOTE — Progress Notes (Signed)
 Received patient in bed.Awake,alert and oriented x 4.Consent verified.  Access used :  Left hd catheter that worked well.Dressing on date.  Duration of treatment:: 2.5 hours.  Net UF : 1 liter.  Tolerated treatment : Yes.  Hemo comment Draw heparin blood level at this time.  Hand off to the patient's nurse: Back into his room with stable medical condition via transporter.

## 2023-04-24 NOTE — Plan of Care (Signed)

## 2023-04-24 NOTE — Progress Notes (Signed)
 Requested to see pt for out-pt HD needs at d/c. Met with pt at bedside while on HD. Introduced self and explained role. Pt prefers [facility] if possible. Referral submitted to [facility] admissions for review. Pt states that spouse will provide transport to HD appts. Will assist as needed.   Olivia Canter Renal Navigator [phone]

## 2023-04-24 NOTE — Progress Notes (Signed)
 PHARMACY - ANTICOAGULATION CONSULT NOTE  Pharmacy Consult for heparin Indication: atrial fibrillation  Allergies  Allergen Reactions   Doxycycline Hives and Rash   Septra [Bactrim] Itching   Penicillins Rash    Allergy to All cillin drugs  Ancef given 9/24 with no obvious reaction   Metformin And Related Diarrhea and Nausea Only   Other Nausea And Vomiting    General Anesthesia - sometimes causes nausea and vomiting * OK to use scopolamine patch     Sulfamethoxazole-Trimethoprim Other (See Comments)   Wellbutrin [Bupropion]     Mood Changes    Patient Measurements: Height: 6\' 1"  (185.4 cm) Weight: 134 kg (295 lb 6.7 oz) IBW/kg (Calculated) : 79.9 Heparin Dosing Weight: 112.8 kg  Vital Signs: Temp: 98.6 F (37 C) (03/05 2000) Temp Source: Oral (03/05 2000) BP: 153/66 (03/06 0341) Pulse Rate: 57 (03/06 0341)  Labs: Recent Labs    04/22/23 0612 04/22/23 1610 04/22/23 9604 04/23/23 0557 04/23/23 0558 04/24/23 0400  HGB 7.8*  --   --  7.7*  --  7.8*  HCT 22.9*  --   --  22.2*  --  22.5*  PLT 132*  --   --  136*  --  136*  APTT 40*  --   --  44*  --  54*  HEPARINUNFRC  --  0.42  --   --  0.27* 0.25*  CREATININE  --   --  7.26*  --  7.18* 8.14*    Estimated Creatinine Clearance: 12.3 mL/min (A) (by C-G formula based on SCr of 8.14 mg/dL (H)).   Medical History: Past Medical History:  Diagnosis Date   Anemia    Anginal pain (HCC) 04/2013   Cardiac cath showed patent stents with distal LAD, circumflex-OM and RCA disease in small vessels.   Anxiety    Atrial fibrillation (HCC) 12/2021   CAD S/P percutaneous coronary angioplasty 2003, 04/2012   status post PCI to LAD, circumflex-OM 2, RCA   Carotid artery occlusion    Chronic renal insufficiency, stage II (mild)    Chronic venous insufficiency    varicosities, no reflux; dopplers 04/14/12- valvular insufficiency in the R and L GSV   Complication of anesthesia    COPD, mild (HCC)    Depression    situaltional     Diabetes mellitus    Diabetic neuropathy (HCC) 08/24/2019   Dyslipidemia associated with type 2 diabetes mellitus (HCC)    Fall at home, initial encounter 01/02/2023   GERD (gastroesophageal reflux disease)    History of cardioversion 12/2021   at [facility]   HTN (hypertension)    Hypothyroidism    Neuromuscular disorder (HCC)    neuropathy in feet   Neuropathy    notably improved following PCI with improved cardiac function   Obesity    Obesity, Class II, BMI 35.0-39.9, with comorbidity (see actual BMI)    BMI 39; wgt loss efforts in place; seeing Dietician   Open ankle fracture 01/02/2023   Orthostatic hypotension    PONV (postoperative nausea and vomiting)    "[employer]"   Pulmonary hypertension (HCC) 04/2012   PA pressure   Sleep apnea    uses nightly   Vitamin D deficiency    per facility notes    Medications:  Scheduled:   allopurinol  50 mg Oral Daily   amiodarone  200 mg Oral BID   atorvastatin  40 mg Oral Daily   carvedilol  3.125 mg Oral BID WC   Chlorhexidine  Gluconate Cloth  6 each Topical Q0600   clopidogrel  75 mg Oral Q breakfast   docusate sodium  100 mg Oral BID   DULoxetine  60 mg Oral Daily   ezetimibe  10 mg Oral Daily   gabapentin  300 mg Oral QHS   guaiFENesin  600 mg Oral BID   insulin aspart  0-9 Units Subcutaneous Q6H   insulin glargine  40 Units Subcutaneous Daily   levothyroxine  88 mcg Oral QAC breakfast   polyethylene glycol  17 g Oral Daily   sodium chloride flush  3 mL Intravenous Q12H   sodium chloride flush  3-10 mL Intravenous Q12H   tamsulosin  0.4 mg Oral Daily   Infusions:   heparin 1,200 Units/hr (04/23/23 1612)     Assessment: 68 y/o male with history of afib on apixaban. Eliquis has been held d/t bleeding from CVL site. IV Heparin was started but then stopped 3/2 due to bleeding from the IV line and has remained on hold for HD cath placement. Plans are to resume IV heparin then resume apixaban later  3/6 AM  update:  Heparin level sub-therapeutic HL/aPTT correlating-DC further aPTT's Hgb stable from yesterday  Goal of Therapy:  Heparin level= 0.3-0.5 units/mL Monitor platelets by anticoagulation protocol: Yes   Plan:  Inc heparin to 1350 units/hr Heparin level in 8 hours  Abran Duke, PharmD, BCPS Clinical Pharmacist Phone: [phone]

## 2023-04-24 NOTE — Plan of Care (Signed)
  Problem: Education: Goal: Knowledge of General Education information will improve Description: Including pain rating scale, medication(s)/side effects and non-pharmacologic comfort measures Outcome: Progressing   Problem: Health Behavior/Discharge Planning: Goal: Ability to manage health-related needs will improve Outcome: Progressing   Problem: Clinical Measurements: Goal: Ability to maintain clinical measurements within normal limits will improve Outcome: Progressing Goal: Will remain free from infection Outcome: Progressing Goal: Diagnostic test results will improve Outcome: Progressing Goal: Cardiovascular complication will be avoided Outcome: Progressing   Problem: Activity: Goal: Risk for activity intolerance will decrease Outcome: Progressing   Problem: Nutrition: Goal: Adequate nutrition will be maintained Outcome: Progressing   Problem: Elimination: Goal: Will not experience complications related to bowel motility Outcome: Progressing   Problem: Pain Managment: Goal: General experience of comfort will improve and/or be controlled Outcome: Progressing   Problem: Safety: Goal: Ability to remain free from injury will improve Outcome: Progressing   Problem: Skin Integrity: Goal: Risk for impaired skin integrity will decrease Outcome: Progressing   Problem: Education: Goal: Ability to describe self-care measures that may prevent or decrease complications (Diabetes Survival Skills Education) will improve Outcome: Progressing Goal: Individualized Educational Video(s) Outcome: Progressing   Problem: Coping: Goal: Ability to adjust to condition or change in health will improve Outcome: Progressing   Problem: Fluid Volume: Goal: Ability to maintain a balanced intake and output will improve Outcome: Progressing   Problem: Health Behavior/Discharge Planning: Goal: Ability to identify and utilize available resources and services will improve Outcome:  Progressing Goal: Ability to manage health-related needs will improve Outcome: Progressing   Problem: Metabolic: Goal: Ability to maintain appropriate glucose levels will improve Outcome: Progressing   Problem: Nutritional: Goal: Maintenance of adequate nutrition will improve Outcome: Progressing Goal: Progress toward achieving an optimal weight will improve Outcome: Progressing   Problem: Skin Integrity: Goal: Risk for impaired skin integrity will decrease Outcome: Progressing   Problem: Tissue Perfusion: Goal: Adequacy of tissue perfusion will improve Outcome: Progressing   Problem: Education: Goal: Understanding of CV disease, CV risk reduction, and recovery process will improve Outcome: Progressing Goal: Individualized Educational Video(s) Outcome: Progressing   Problem: Activity: Goal: Ability to return to baseline activity level will improve Outcome: Progressing   Problem: Cardiovascular: Goal: Ability to achieve and maintain adequate cardiovascular perfusion will improve Outcome: Progressing Goal: Vascular access site(s) Level 0-1 will be maintained Outcome: Progressing   Problem: Health Behavior/Discharge Planning: Goal: Ability to safely manage health-related needs after discharge will improve Outcome: Progressing

## 2023-04-24 NOTE — Progress Notes (Addendum)
 [facility]                                                                              Harold [name], is a 68 y.o. male, DOB - 1955/12/28, ZOX:[id number] Admit date - 03/27/2023    Outpatient Primary MD for the patient is [name], MD  LOS - 28  days  Chief Complaint  Patient presents with   Cough       Brief summary   Patient is a 68 year old male with morbid obesity, OSA, DM2, HTN, HLD, CAD/stent, A-fib on Eliquis, carotid artery stenosis, CKD, chronic anemia, anxiety/depression, diabetic neuropathy, GERD, hypothyroidism, COPD, pulmonary hypertension. 2/6, patient presented to the ED with complaint of cough, subjective fever, myalgia, generalized weakness for 5 days. Respiratory virus panel positive for influenza A. Chest x-ray negative for infiltrates. Also noted to have right leg cellulitis.  Assessment & Plan   Acute respiratory failure with hypoxia Influenza A -Originally admitted for influenza A on 03-27-2023.  Presented with 5 days of cough, fever, myalgia.  -Respiratory virus panel positive for influenza A.  -Chest x-ray without acute infiltrate.  Improved with symptomatic treatment, was outside 48-hour window for Tamiflu hence was not initiated.  -wean O2 as tolerated   Right leg wound infection/osteomyelitis right ankle s/p hardware removal Enterobacter Cloacea, MRSE  -Prior right ankle surgery (12/2022) complicated by postsurgical wound infection. -S/p right ankle hardware removal, I&D and wound VAC placement on 2/10 by Dr. Jena Gauss -S/p repeat debridement of the right ankle by Dr. Lajoyce Corners 04/02/2023.   -ID was consulted, initially on cefepime, subsequently on daptomycin and ertapenem, now antibiotics held due to worsening renal function.     AKI on CKD stage IIIb:  Nephrotic range proteinuria, uremia, Anion gap metabolic acidosis -Baseline creatinine appears to be 1.9-2.2.   -Renal function has continued to progressively worsen -Nephrology  following, renal biopsy deferred due to high risk of bleeding and on Plavix for recent stent for PAD  -TDC placed on 3/4, underwent first HD session  -creatinine continues to rise, 8.14 today, plan for HD today   Right facial droop -On 3/1 patient noted to have right facial droop, evaluated by neurology, MRI of the brain negative for acute stroke. -Currently on Plavix.  PAD -Seen by vascular surgery underwent aortogram, right leg angiogram, successful stenting of right superficial femoral artery on 04/01/2023.   -Continue Plavix, Zetia  Acute metabolic encephalopathy -Likely multifactorial, uremia, infection, drug-induced from possibility of cefepime induced encephalopathy, chronic worsening memory issues -Started on hemodialysis Continue Cymbalta, decreased Neurontin   Type 2 diabetes mellitus / Diabetic neuropathy, POA: A1c 7 on 01/02/2023. PTA meds-Tresiba 60 units daily, Mounjaro  CBG (last 3)  Recent Labs    04/23/23 2350 04/24/23 0604 04/24/23 1200  GLUCAP 187* 167* 184*   -Continue Semglee, SSI   Diabetic neuropathy -Gabapentin decreased due to worsening renal function. (PTA reportedly on gabapentin 1600 mg 3 times daily)   Hypokalemia/hypomagnesemia: Improved   Paroxysmal A-fib -HR controlled, continue Coreg, amiodarone  -CHADS-VASc of 4 -Eliquis held secondary to bleeding from CVL recently -Heparin IV resumed   Essential hypertension -BP stable, continue low-dose Coreg  Anemia of chronic disease: Baseline hemoglobin between 10.5 11.5.   -H&H stable, borderline  CAD/stent Carotid artery stenosis HLD Continue PTA meds -Plavix, Crestor -Eliquis held   Hypothyroidism Continue Synthroid   Obstructive sleep apnea CPAP at bedtime    Gout Continue allopurinol, colchicine    Orthostatic hypotension -Received brief IV fluids, continue abdominal binder -2D echo showed EF 55 to 60%, basal inferior hypokinesis, G2 DD    Generalized debility -PT had  recommended SNF however patient will have to pay out-of-pocket not able to afford.  Acute diarrhea Resolved   Influenza A:  - Presented with 5 days of cough, fever, myalgia.  - Respiratory virus panel positive for influenza A. Chest x-ray without acute infiltrate -Improved with symptomatic treatment, was outside 48-hour window for Tamiflu hence was not initiated.   Moderate obesity, class II Estimated body mass index is 38.98 kg/m as calculated from the following:   Height as of this encounter: 6\' 1"  (1.854 m).   Weight as of this encounter: 134 kg.  Code Status: Full code DVT Prophylaxis:  Place and maintain sequential compression device Start: 04/15/23 1549 SCDs Start: 04/02/23 1617   Level of Care: Level of care: Progressive Cardiac Family Communication:  Disposition Plan:      Remains inpatient appropriate:      Procedures:  2D echo [facility] placement 04/22/23, started on HD #1 on 04/22/23  Consultants:   Renal Ortho ID   Antimicrobials:   Anti-infectives (From admission, onward)    Start     Dose/Rate Route Frequency Ordered Stop   04/22/23 1300  vancomycin (VANCOCIN) IVPB 1000 mg/200 mL premix        1,000 mg 200 mL/hr over 60 Minutes Intravenous To Radiology 04/22/23 1110 04/22/23 1849   04/19/23 1000  ertapenem (INVANZ) 500 mg in sodium chloride 0.9 % 50 mL IVPB  Status:  Discontinued        500 mg 100 mL/hr over 30 Minutes Intravenous Daily 04/18/23 0931 04/18/23 1106   04/17/23 1800  ertapenem (INVANZ) 1 g in sodium chloride 0.9 % 100 mL IVPB  Status:  Discontinued        1 g 200 mL/hr over 30 Minutes Intravenous Daily 04/17/23 1354 04/18/23 0931   04/08/23 0000  daptomycin (CUBICIN) IVPB  Status:  Discontinued        700 mg Intravenous Every 24 hours 04/08/23 1147 04/08/23    04/08/23 0000  ceFEPime (MAXIPIME) IVPB  Status:  Discontinued        2 g Intravenous Every 12 hours 04/08/23 1147 04/18/23    04/08/23 0000  daptomycin (CUBICIN) IVPB  Status:   Discontinued        700 mg Intravenous Every 24 hours 04/08/23 1150 04/18/23    04/04/23 1400  DAPTOmycin (CUBICIN) IVPB 700 mg/132mL premix  Status:  Discontinued        700 mg 200 mL/hr over 30 Minutes Intravenous Daily 04/03/23 1435 04/18/23 1106   04/02/23 1715  ceFAZolin (ANCEF) IVPB 2g/100 mL premix  Status:  Discontinued        2 g 200 mL/hr over 30 Minutes Intravenous Every 8 hours 04/02/23 1616 04/02/23 1623   04/02/23 0600  ceFAZolin (ANCEF) IVPB 3g/150 mL premix  Status:  Discontinued        3 g 300 mL/hr over 30 Minutes Intravenous On call to O.R. 04/01/23 1700 04/02/23 1549   03/31/23 1015  ceFAZolin (ANCEF) IVPB 2g/100 mL premix        2 g  200 mL/hr over 30 Minutes Intravenous On call to O.R. 03/31/23 0921 03/31/23 1203   03/29/23 2200  vancomycin (VANCOREADY) IVPB 2000 mg/400 mL  Status:  Discontinued       Placed in "Followed by" Linked Group   2,000 mg 200 mL/hr over 120 Minutes Intravenous Every 48 hours 03/27/23 2257 04/03/23 1435   03/28/23 1045  metroNIDAZOLE (FLAGYL) tablet 500 mg  Status:  Discontinued        500 mg Oral Every 12 hours 03/28/23 0955 04/03/23 0945   03/27/23 2330  vancomycin (VANCOCIN) 2,500 mg in sodium chloride 0.9 % 500 mL IVPB       Placed in "Followed by" Linked Group   2,500 mg 262.5 mL/hr over 120 Minutes Intravenous  Once 03/27/23 2257 03/28/23 0241   03/27/23 2300  metroNIDAZOLE (FLAGYL) IVPB 500 mg  Status:  Discontinued        500 mg 100 mL/hr over 60 Minutes Intravenous Every 12 hours 03/27/23 2247 03/28/23 0955   03/27/23 2300  ceFEPIme (MAXIPIME) 2 g in sodium chloride 0.9 % 100 mL IVPB  Status:  Discontinued        2 g 200 mL/hr over 30 Minutes Intravenous Every 12 hours 03/27/23 2257 04/17/23 1354   03/27/23 2100  ceFAZolin (ANCEF) IVPB 2g/100 mL premix        2 g 200 mL/hr over 30 Minutes Intravenous  Once 03/27/23 2058 03/27/23 2219          Medications  allopurinol  50 mg Oral Daily   amiodarone  200 mg Oral BID    atorvastatin  40 mg Oral Daily   carvedilol  3.125 mg Oral BID WC   Chlorhexidine Gluconate Cloth  6 each Topical Q0600   clopidogrel  75 mg Oral Q breakfast   docusate sodium  100 mg Oral BID   DULoxetine  60 mg Oral Daily   ezetimibe  10 mg Oral Daily   gabapentin  300 mg Oral QHS   guaiFENesin  600 mg Oral BID   insulin aspart  0-9 Units Subcutaneous Q6H   insulin glargine  40 Units Subcutaneous Daily   levothyroxine  88 mcg Oral QAC breakfast   polyethylene glycol  17 g Oral Daily   sodium chloride flush  3 mL Intravenous Q12H   sodium chloride flush  3-10 mL Intravenous Q12H   tamsulosin  0.4 mg Oral Daily      Subjective:   Harold [name] was seen and examined today.  Somnolent but easily arousable, denies any specific complaints.  No chest pain, shortness of breath, fevers or chills, on CPAP Objective:   Vitals:   04/23/23 2000 04/23/23 2208 04/24/23 0341 04/24/23 0755  BP: (!) 135/56  (!) 153/66 (!) 155/65  Pulse: 60  (!) 57 (!) 57  Resp: 18 16 18 18   Temp: 98.6 F (37 C)   97.9 F (36.6 C)  TempSrc: Oral   Axillary  SpO2: 100%  97% 100%  Weight:   134 kg   Height:        Intake/Output Summary (Last 24 hours) at 04/24/2023 1249 Last data filed at 04/24/2023 0001 Gross per 24 hour  Intake --  Output 600 ml  Net -600 ml      Wt Readings from Last 3 Encounters:  04/24/23 134 kg  01/28/23 (!) 142.9 kg  01/09/23 (!) 145 kg    Physical Exam General: Somnolent but easily arousable, on CPAP, ill-appearing Cardiovascular: S1 S2 clear, RRR.  Respiratory: CTAB  Gastrointestinal: Soft, nontender, nondistended, NBS Ext: trace pedal edema bilaterally Skin: No rashes Psych: somnolent but arousable   Data Reviewed:  I have personally reviewed following labs    CBC Lab Results  Component Value Date   WBC 5.2 04/24/2023   RBC 2.65 (L) 04/24/2023   HGB 7.8 (L) 04/24/2023   HCT 22.5 (L) 04/24/2023   MCV 84.9 04/24/2023   MCH 29.4 04/24/2023   PLT 136 (L)  04/24/2023   MCHC 34.7 04/24/2023   RDW 15.8 (H) 04/24/2023   LYMPHSABS 0.9 04/15/2023   MONOABS 0.6 04/15/2023   EOSABS 0.3 04/15/2023   BASOSABS 0.1 04/15/2023     Last metabolic panel Lab Results  Component Value Date   NA 131 (L) 04/24/2023   K 3.6 04/24/2023   CL 97 (L) 04/24/2023   CO2 19 (L) 04/24/2023   BUN 82 (H) 04/24/2023   CREATININE 8.14 (H) 04/24/2023   GLUCOSE 184 (H) 04/24/2023   GFRNONAA 7 (L) 04/24/2023   GFRAA 47 (L) 09/28/2019   CALCIUM 8.3 (L) 04/24/2023   PHOS 5.1 (H) 04/24/2023   PROT 5.8 (L) 04/16/2023   ALBUMIN 2.2 (L) 04/24/2023   LABGLOB 2.2 04/16/2023   AGRATIO 1.7 04/16/2023   BILITOT 0.6 04/16/2023   ALKPHOS 73 04/16/2023   AST 50 (H) 04/16/2023   ALT 42 04/16/2023   ANIONGAP 15 04/24/2023    CBG (last 3)  Recent Labs    04/23/23 2350 04/24/23 0604 04/24/23 1200  GLUCAP 187* 167* 184*      Coagulation Profile: No results for input(s): "INR", "PROTIME" in the last 168 hours.   Radiology Studies: I have personally reviewed the imaging studies  IR Fluoro Guide CV Line Left Result Date: 04/22/2023 INDICATION: 38 year old with acute kidney injury. Patient needs a dialysis catheter. EXAM: FLUOROSCOPIC AND ULTRASOUND GUIDED PLACEMENT OF A TUNNELED DIALYSIS CATHETER Physician: Rachelle Hora. Lowella Dandy, MD MEDICATIONS: Vancomycin 1 g; The antibiotic was administered within an appropriate time interval prior to skin puncture. ANESTHESIA/SEDATION: Moderate (conscious) sedation was employed during this procedure. A total of Versed 1.0mg  and fentanyl 50 mcg was administered intravenously at the order of the provider performing the procedure. Total intra-service moderate sedation time: 20 minutes. Patient's level of consciousness and vital signs were monitored continuously by radiology nurse throughout the procedure under the supervision of the provider performing the procedure. FLUOROSCOPY TIME:  Radiation Exposure Index (as provided by the fluoroscopic device):  8 mGy Kerma COMPLICATIONS: None immediate. PROCEDURE: Informed consent was obtained for placement of a tunneled dialysis catheter. The patient was placed supine on the interventional table. Ultrasound confirmed a patent left internal jugular vein. Ultrasound image obtained for documentation. The left neck and chest was prepped and draped in a sterile fashion. Maximal barrier sterile technique was utilized including caps, mask, sterile gowns, sterile gloves, sterile drape, hand hygiene and skin antiseptic. The left neck was anesthetized with 1% lidocaine. A small incision was made with #11 blade scalpel. A 21 gauge needle directed into the left internal jugular vein with ultrasound guidance. A micropuncture dilator set was placed. A 28 cm tip to cuff Palindrome catheter was selected. The skin below the left clavicle was anesthetized and a small incision was made with an #11 blade scalpel. A subcutaneous tunnel was formed to the vein dermatotomy site. The catheter was brought through the tunnel. The vein dermatotomy site was dilated to accommodate a peel-away sheath over a wire. The catheter was placed through the peel-away sheath and directed into the central venous  structures. The tip of the catheter was placed at superior cavoatrial junction with fluoroscopy. Fluoroscopic images were obtained for documentation. Both lumens were found to aspirate and flush well. The proper amount of heparin was flushed in both lumens. The vein dermatotomy site was closed using a single layer of absorbable suture and Dermabond. The catheter was secured to the skin using Prolene suture. IMPRESSION: Successful placement of a left jugular tunneled dialysis catheter using ultrasound and fluoroscopic guidance. Electronically Signed   By: Richarda Overlie M.D.   On: 04/22/2023 15:05   IR US Guide Vasc Access Left Result Date: 04/22/2023 INDICATION: 89 year old with acute kidney injury. Patient needs a dialysis catheter. EXAM: FLUOROSCOPIC AND  ULTRASOUND GUIDED PLACEMENT OF A TUNNELED DIALYSIS CATHETER Physician: Rachelle Hora. Lowella Dandy, MD MEDICATIONS: Vancomycin 1 g; The antibiotic was administered within an appropriate time interval prior to skin puncture. ANESTHESIA/SEDATION: Moderate (conscious) sedation was employed during this procedure. A total of Versed 1.0mg  and fentanyl 50 mcg was administered intravenously at the order of the provider performing the procedure. Total intra-service moderate sedation time: 20 minutes. Patient's level of consciousness and vital signs were monitored continuously by radiology nurse throughout the procedure under the supervision of the provider performing the procedure. FLUOROSCOPY TIME:  Radiation Exposure Index (as provided by the fluoroscopic device): 8 mGy Kerma COMPLICATIONS: None immediate. PROCEDURE: Informed consent was obtained for placement of a tunneled dialysis catheter. The patient was placed supine on the interventional table. Ultrasound confirmed a patent left internal jugular vein. Ultrasound image obtained for documentation. The left neck and chest was prepped and draped in a sterile fashion. Maximal barrier sterile technique was utilized including caps, mask, sterile gowns, sterile gloves, sterile drape, hand hygiene and skin antiseptic. The left neck was anesthetized with 1% lidocaine. A small incision was made with #11 blade scalpel. A 21 gauge needle directed into the left internal jugular vein with ultrasound guidance. A micropuncture dilator set was placed. A 28 cm tip to cuff Palindrome catheter was selected. The skin below the left clavicle was anesthetized and a small incision was made with an #11 blade scalpel. A subcutaneous tunnel was formed to the vein dermatotomy site. The catheter was brought through the tunnel. The vein dermatotomy site was dilated to accommodate a peel-away sheath over a wire. The catheter was placed through the peel-away sheath and directed into the central venous structures.  The tip of the catheter was placed at superior cavoatrial junction with fluoroscopy. Fluoroscopic images were obtained for documentation. Both lumens were found to aspirate and flush well. The proper amount of heparin was flushed in both lumens. The vein dermatotomy site was closed using a single layer of absorbable suture and Dermabond. The catheter was secured to the skin using Prolene suture. IMPRESSION: Successful placement of a left jugular tunneled dialysis catheter using ultrasound and fluoroscopic guidance. Electronically Signed   By: Richarda Overlie M.D.   On: 04/22/2023 15:05        Ellinore Merced M.D. [facility] 04/24/2023, 12:49 PM  Available via Epic secure chat 7am-7pm After 7 pm, please refer to night coverage provider listed on amion.

## 2023-04-24 NOTE — Plan of Care (Signed)
  Problem: Safety: Goal: Ability to remain free from injury will improve Outcome: Progressing   Problem: Skin Integrity: Goal: Risk for impaired skin integrity will decrease Outcome: Progressing   Problem: Education: Goal: Ability to describe self-care measures that may prevent or decrease complications (Diabetes Survival Skills Education) will improve Outcome: Progressing Goal: Individualized Educational Video(s) Outcome: Progressing   Problem: Education: Goal: Individualized Educational Video(s) Outcome: Progressing   Problem: Coping: Goal: Ability to adjust to condition or change in health will improve Outcome: Progressing   Problem: Fluid Volume: Goal: Ability to maintain a balanced intake and output will improve Outcome: Progressing   Problem: Education: Goal: Knowledge of General Education information will improve Description: Including pain rating scale, medication(s)/side effects and non-pharmacologic comfort measures Outcome: Progressing   Problem: Health Behavior/Discharge Planning: Goal: Ability to manage health-related needs will improve Outcome: Progressing   Problem: Clinical Measurements: Goal: Ability to maintain clinical measurements within normal limits will improve Outcome: Progressing Goal: Will remain free from infection Outcome: Progressing Goal: Diagnostic test results will improve Outcome: Progressing Goal: Cardiovascular complication will be avoided Outcome: Progressing

## 2023-04-24 NOTE — Progress Notes (Signed)
 Physical Therapy Treatment Patient Details Name: Harold [name] MRN: [id number] DOB: 14-Dec-1955 Today's Date: 04/24/2023   History of Present Illness 68 y.o. male presents to [facility] 03/27/23 with productive cough and generalized weakness. Admitted with influenza A and cellulitis of R foot s/p hardware removal of the R ankle and application of wound vac on 2/10, wound vac removed 2/27. Left thigh pain, CT negative. Prior admit 11/24 for R ankle ORIF and in 12/24 for revision. S/p excisional debridement of right ankle on 2/12. Pt underwent central line placement on 04/07/2023 however he has been having issues with recurrent bleeding from the insertion site which has finally stopped. 04/17/23 pt with drop in sats requiring 2 L of O2 via [place], chest xray concerning for edema or possibly pneumonia.Significant PMH: HTN, HLD, CAD s/p PCI, COPD, DM2, CKD stage IIIb, PVD, gout, OSA, neuropathy, COPD, a-fib, CAD s/p percutaneous coronary angioplasty, angina, obesity    PT Comments  STAR PT/OT Session: Pt much more awake and participatory today, but continues to be limited in safe mobility by symptomatic decrease in BP with standing. Pt grossly modAx2 for all mobility. Plan for standing in [place] to transfer to sink to wash up and brush teeth unfortunately with coming to standing BP dropped to  90/46 with onset of dizziness. Pt returned to sitting EoB, where BP rebounded to 119/67. Pt able to sit EoB to perform self care, pt requiring off and on back support due to weakness. Once cleaned up pt is able to use Stedy to clear hips for placement of clean linens prior to return to supine. Pt to have HD this afternoon. PT/OT will follow back tomorrow to progress mobility with use of abdominal binder.     If plan is discharge home, recommend the following: A lot of help with walking and/or transfers;A lot of help with bathing/dressing/bathroom;Assistance with cooking/housework;Assist for transportation;Help with stairs or ramp  for entrance   Can travel by private vehicle     Yes  Equipment Recommendations  BSC/3in1       Precautions / Restrictions Precautions Precautions: Fall Recall of Precautions/Restrictions: Impaired Precaution/Restrictions Comments: Watch BP Required Braces or Orthoses: Other Brace Other Brace: R CAM boot for ambulation, R splint in bed, abdominal binder (Prn) Restrictions Weight Bearing Restrictions Per Provider Order: Yes RLE Weight Bearing Per Provider Order: Weight bearing as tolerated Other Position/Activity Restrictions: per Dr. Audrie Lia note 04/03/2023 and orders 04/03/2023 pt WBAT     Mobility  Bed Mobility Overal bed mobility: Needs Assistance Bed Mobility: Supine to Sit, Sit to Supine     Supine to sit: Mod assist, +2 for physical assistance Sit to supine: Mod assist, +2 for physical assistance   General bed mobility comments: patient demonstrating increased participation but continues to require mod assist +2 to perform due to weakness and fatigue    Transfers Overall transfer level: Needs assistance Equipment used: Ambulation equipment used Transfers: Sit to/from Stand Sit to Stand: Mod assist, +2 physical assistance, From elevated surface           General transfer comment: peformed standing in stedy with BP dropping while standing and patient complaining of dizziness        Balance Overall balance assessment: Needs assistance Sitting-balance support: Feet supported, Bilateral upper extremity supported Sitting balance-Leahy Scale: Poor Sitting balance - Comments: patient demonstrating posterior leaning while on EOB, often to relieve pressure from buttocks with patient requiring min to CGA for balance Postural control: Posterior lean Standing balance support: Bilateral upper extremity supported,  During functional activity, Reliant on assistive device for balance Standing balance-Leahy Scale: Poor Standing balance comment: stood in [place] with patient  reliant on Stedy for support                            Communication Communication Communication: No apparent difficulties  Cognition Arousal: Alert Behavior During Therapy: Flat affect                             Following commands: Impaired Following commands impaired: Follows one step commands with increased time    Cueing Cueing Techniques: Verbal cues, Tactile cues     General Comments General comments (skin integrity, edema, etc.): BP while standing in stedy 90/46 (58)      Pertinent Vitals/Pain Pain Assessment Pain Assessment: Faces Faces Pain Scale: Hurts even more Pain Location: left hip when sitting on EOB Pain Descriptors / Indicators: Discomfort, Guarding Pain Intervention(s): Limited activity within patient's tolerance, Monitored during session, Repositioned     PT Goals (current goals can now be found in the care plan section) Acute Rehab PT Goals PT Goal Formulation: With patient Time For Goal Achievement: 05/01/23 Potential to Achieve Goals: Fair Progress towards PT goals: Progressing toward goals    Frequency    Min 1X/week           Co-evaluation PT/OT/SLP Co-Evaluation/Treatment: Yes Reason for Co-Treatment: To address functional/ADL transfers;For patient/therapist safety;Other (comment) (activity tolerance) PT goals addressed during session: Mobility/safety with mobility;Proper use of DME;Balance OT goals addressed during session: ADL's and self-care      AM-PAC PT "6 Clicks" Mobility   Outcome Measure  Help needed turning from your back to your side while in a flat bed without using bedrails?: A Little Help needed moving from lying on your back to sitting on the side of a flat bed without using bedrails?: A Lot Help needed moving to and from a bed to a chair (including a wheelchair)?: A Lot Help needed standing up from a chair using your arms (e.g., wheelchair or bedside chair)?: A Lot Help needed to walk in  hospital room?: Total Help needed climbing 3-5 steps with a railing? : Total 6 Click Score: 11    End of Session Equipment Utilized During Treatment: Gait belt Activity Tolerance: Treatment limited secondary to medical complications (Comment) (decreased BP in standing) Patient left: in bed;with call bell/phone within reach;with bed alarm set Nurse Communication: Mobility status PT Visit Diagnosis: Other abnormalities of gait and mobility (R26.89);Unsteadiness on feet (R26.81);Muscle weakness (generalized) (M62.81);History of falling (Z91.81);Difficulty in walking, not elsewhere classified (R26.2)     Time: 1610-9604 PT Time Calculation (min) (ACUTE ONLY): 42 min  Charges:    $Therapeutic Activity: 8-22 mins PT General Charges $$ ACUTE PT VISIT: 1 Visit                     Advika Mclelland B. Beverely Risen PT, DPT Acute Rehabilitation Services Please use secure chat or  Call Office [phone]    Elon Alas [facility] 04/24/2023, 3:19 PM

## 2023-04-24 NOTE — Progress Notes (Signed)
 PHARMACY - ANTICOAGULATION CONSULT NOTE  Pharmacy Consult for heparin Indication: atrial fibrillation  Allergies  Allergen Reactions   Doxycycline Hives and Rash   Septra [Bactrim] Itching   Penicillins Rash    Allergy to All cillin drugs  Ancef given 9/24 with no obvious reaction   Metformin And Related Diarrhea and Nausea Only   Other Nausea And Vomiting    General Anesthesia - sometimes causes nausea and vomiting * OK to use scopolamine patch     Sulfamethoxazole-Trimethoprim Other (See Comments)   Wellbutrin [Bupropion]     Mood Changes    Patient Measurements: Height: 6\' 1"  (185.4 cm) Weight: 133 kg (293 lb 3.4 oz) IBW/kg (Calculated) : 79.9 Heparin Dosing Weight: 112.8 kg  Vital Signs: Temp: 98 F (36.7 C) (03/06 1726) Temp Source: Oral (03/06 1726) BP: 156/72 (03/06 1726) Pulse Rate: 60 (03/06 1600)  Labs: Recent Labs    04/22/23 0612 04/22/23 4166 04/22/23 0614 04/23/23 0557 04/23/23 0558 04/24/23 0400 04/24/23 1733  HGB 7.8*  --   --  7.7*  --  7.8*  --   HCT 22.9*  --   --  22.2*  --  22.5*  --   PLT 132*  --   --  136*  --  136*  --   APTT 40*  --   --  44*  --  54*  --   HEPARINUNFRC  --    < >  --   --  0.27* 0.25* 0.28*  CREATININE  --   --  7.26*  --  7.18* 8.14*  --    < > = values in this interval not displayed.    Estimated Creatinine Clearance: 12.2 mL/min (A) (by C-G formula based on SCr of 8.14 mg/dL (H)).   Medical History: Past Medical History:  Diagnosis Date   Anemia    Anginal pain (HCC) 04/2013   Cardiac cath showed patent stents with distal LAD, circumflex-OM and RCA disease in small vessels.   Anxiety    Atrial fibrillation (HCC) 12/2021   CAD S/P percutaneous coronary angioplasty 2003, 04/2012   status post PCI to LAD, circumflex-OM 2, RCA   Carotid artery occlusion    Chronic renal insufficiency, stage II (mild)    Chronic venous insufficiency    varicosities, no reflux; dopplers 04/14/12- valvular insufficiency in the R  and L GSV   Complication of anesthesia    COPD, mild (HCC)    Depression    situaltional    Diabetes mellitus    Diabetic neuropathy (HCC) 08/24/2019   Dyslipidemia associated with type 2 diabetes mellitus (HCC)    Fall at home, initial encounter 01/02/2023   GERD (gastroesophageal reflux disease)    History of cardioversion 12/2021   at [facility]   HTN (hypertension)    Hypothyroidism    Neuromuscular disorder (HCC)    neuropathy in feet   Neuropathy    notably improved following PCI with improved cardiac function   Obesity    Obesity, Class II, BMI 35.0-39.9, with comorbidity (see actual BMI)    BMI 39; wgt loss efforts in place; seeing Dietician   Open ankle fracture 01/02/2023   Orthostatic hypotension    PONV (postoperative nausea and vomiting)    "[employer] Works"   Pulmonary hypertension (HCC) 04/2012   PA pressure   Sleep apnea    uses nightly   Vitamin D deficiency    per facility notes    Medications:  Scheduled:   allopurinol  50 mg Oral Daily   amiodarone  200 mg Oral BID   atorvastatin  40 mg Oral Daily   carvedilol  3.125 mg Oral BID WC   Chlorhexidine Gluconate Cloth  6 each Topical Q0600   clopidogrel  75 mg Oral Q breakfast   docusate sodium  100 mg Oral BID   DULoxetine  60 mg Oral Daily   ezetimibe  10 mg Oral Daily   gabapentin  300 mg Oral QHS   guaiFENesin  600 mg Oral BID   insulin aspart  0-9 Units Subcutaneous Q6H   insulin glargine  40 Units Subcutaneous Daily   levothyroxine  88 mcg Oral QAC breakfast   polyethylene glycol  17 g Oral Daily   sodium chloride flush  3 mL Intravenous Q12H   sodium chloride flush  3-10 mL Intravenous Q12H   tamsulosin  0.4 mg Oral Daily   Infusions:   heparin 1,350 Units/hr (04/24/23 1223)     Assessment: 68 y/o male with history of afib on apixaban. Eliquis has been held d/t bleeding from CVL site. IV Heparin was started but then stopped 3/2 due to bleeding from the IV line and has remained on  hold for HD cath placement. Plans are to resume IV heparin then resume apixaban later  3/6 PM update:  Heparin level sub-therapeutic - no known issues with IV infusion  Goal of Therapy:  Heparin level= 0.3-0.5 units/mL Monitor platelets by anticoagulation protocol: Yes   Plan:  Increase heparin to 1450 units/hr Heparin level in 8 hours Daily heparin level and CBC.   Reece Leader, Colon Flattery, BCCP Clinical Pharmacist  04/24/2023 7:37 PM   [facility] pharmacy phone numbers are listed on [email or web address]

## 2023-04-24 NOTE — Progress Notes (Signed)
 [facility]  Date of Admission:  03/27/2023     Reason for Follow Up: Abscess of skin of right ankle  Total days of antibiotics 22         ASSESSMENT:  [name] remains afebrile and without leukocytosis in the setting of infected right ankle status post hardware removal and off antibiotics for approximately 5 days.  Wounds appear to be healing without evidence of further infection or complication.  Encouraged continued blood sugar control and protein intake.  Continues on hemodialysis managed by nephrology.  ID will continue to follow peripherally.  Remaining medical and supportive care per internal medicine.  PLAN:  Continue to monitor off antibiotics. Postoperative wound care per orthopedics. Dialysis dependent anuric AKI per nephrology Optimize blood sugar control and protein intake. Weaning medical and supportive care per internal medicine.  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, right (HCC)   PAD (peripheral artery disease) (HCC)   Osteomyelitis (HCC)   Obesity, Class II, BMI 35-39.9    allopurinol  50 mg Oral Daily   amiodarone  200 mg Oral BID   atorvastatin  40 mg Oral Daily   carvedilol  3.125 mg Oral BID WC   Chlorhexidine Gluconate Cloth  6 each Topical Q0600   clopidogrel  75 mg Oral Q breakfast   docusate sodium  100 mg Oral BID   DULoxetine  60 mg Oral Daily   ezetimibe  10 mg Oral Daily   gabapentin  300 mg Oral QHS   guaiFENesin  600 mg Oral BID   insulin aspart  0-9 Units Subcutaneous Q6H   insulin glargine  40 Units  Subcutaneous Daily   levothyroxine  88 mcg Oral QAC breakfast   polyethylene glycol  17 g Oral Daily   sodium chloride flush  3 mL Intravenous Q12H   sodium chloride flush  3-10 mL Intravenous Q12H   tamsulosin  0.4 mg Oral Daily    SUBJECTIVE:  Afebrile overnight with no acute events.  Has been off antibiotics for approximately 5 days.  Feeling okay with no fevers, chills, or sweats.  No changes in pain.  Allergies  Allergen Reactions   Doxycycline Hives and Rash   Septra [Bactrim] Itching   Penicillins Rash    Allergy to All cillin drugs  Ancef given 9/24 with no obvious reaction   Metformin And Related Diarrhea and Nausea Only   Other Nausea And Vomiting    General Anesthesia - sometimes causes nausea and vomiting * OK to use scopolamine patch     Sulfamethoxazole-Trimethoprim Other (See Comments)   Wellbutrin [Bupropion]     Mood Changes     Review of Systems: Review of Systems  Constitutional:  Negative for chills, fever and weight loss.  Respiratory:  Negative for cough, shortness of breath and wheezing.   Cardiovascular:  Negative for chest pain and leg swelling.  Gastrointestinal:  Negative for abdominal pain, constipation, diarrhea, nausea and vomiting.  Skin:  Negative for rash.      OBJECTIVE: Vitals:   04/23/23 2000 04/23/23  2208 04/24/23 0341 04/24/23 0755  BP: (!) 135/56  (!) 153/66 (!) 155/65  Pulse: 60  (!) 57 (!) 57  Resp: 18 16 18 18   Temp: 98.6 F (37 C)   97.9 F (36.6 C)  TempSrc: Oral   Axillary  SpO2: 100%  97% 100%  Weight:   134 kg   Height:       Body mass index is 38.98 kg/m.  Physical Exam Constitutional:      General: He is not in acute distress.    Appearance: He is well-developed.  Cardiovascular:     Rate and Rhythm: Normal rate and regular rhythm.     Heart sounds: Normal heart sounds.  Pulmonary:     Effort: Pulmonary effort is normal.     Breath sounds: Normal breath sounds.  Skin:    General: Skin is warm and dry.   Neurological:     Mental Status: He is alert and oriented to person, place, and time.  Psychiatric:        Mood and Affect: Mood normal.     Lab Results Lab Results  Component Value Date   WBC 5.2 04/24/2023   HGB 7.8 (L) 04/24/2023   HCT 22.5 (L) 04/24/2023   MCV 84.9 04/24/2023   PLT 136 (L) 04/24/2023    Lab Results  Component Value Date   CREATININE 8.14 (H) 04/24/2023   BUN 82 (H) 04/24/2023   NA 131 (L) 04/24/2023   K 3.6 04/24/2023   CL 97 (L) 04/24/2023   CO2 19 (L) 04/24/2023    Lab Results  Component Value Date   ALT 42 04/16/2023   AST 50 (H) 04/16/2023   GGT 28 02/20/2018   ALKPHOS 73 04/16/2023   BILITOT 0.6 04/16/2023     Microbiology: No results found for this or any previous visit (from the past 240 hours).   Marcos Eke, NP [facility]  04/24/2023  1:30 PM

## 2023-04-25 ENCOUNTER — Other Ambulatory Visit (HOSPITAL_COMMUNITY): Payer: Self-pay

## 2023-04-25 DIAGNOSIS — L02415 Cutaneous abscess of right lower limb: Secondary | ICD-10-CM | POA: Diagnosis not present

## 2023-04-25 LAB — RENAL FUNCTION PANEL
Albumin: 2.3 g/dL — ABNORMAL LOW (ref 3.5–5.0)
Anion gap: 9 (ref 5–15)
BUN: 64 mg/dL — ABNORMAL HIGH (ref 8–23)
CO2: 22 mmol/L (ref 22–32)
Calcium: 8 mg/dL — ABNORMAL LOW (ref 8.9–10.3)
Chloride: 99 mmol/L (ref 98–111)
Creatinine, Ser: 7.16 mg/dL — ABNORMAL HIGH (ref 0.61–1.24)
GFR, Estimated: 8 mL/min — ABNORMAL LOW (ref >60)
Glucose, Bld: 196 mg/dL — ABNORMAL HIGH (ref 70–99)
Phosphorus: 4.6 mg/dL (ref 2.5–4.6)
Potassium: 3.5 mmol/L (ref 3.5–5.1)
Sodium: 130 mmol/L — ABNORMAL LOW (ref 135–145)

## 2023-04-25 LAB — CBC
HCT: 23.3 % — ABNORMAL LOW (ref 39.0–52.0)
Hemoglobin: 8 g/dL — ABNORMAL LOW (ref 13.0–17.0)
MCH: 29.2 pg (ref 26.0–34.0)
MCHC: 34.3 g/dL (ref 30.0–36.0)
MCV: 85 fL (ref 80.0–100.0)
Platelets: 154 10*3/uL (ref 150–400)
RBC: 2.74 MIL/uL — ABNORMAL LOW (ref 4.22–5.81)
RDW: 15.9 % — ABNORMAL HIGH (ref 11.5–15.5)
WBC: 5.6 10*3/uL (ref 4.0–10.5)
nRBC: 0 % (ref 0.0–0.2)

## 2023-04-25 LAB — GLUCOSE, CAPILLARY
Glucose-Capillary: 187 mg/dL — ABNORMAL HIGH (ref 70–99)
Glucose-Capillary: 157 mg/dL — ABNORMAL HIGH (ref 70–99)
Glucose-Capillary: 163 mg/dL — ABNORMAL HIGH (ref 70–99)
Glucose-Capillary: 196 mg/dL — ABNORMAL HIGH (ref 70–99)

## 2023-04-25 LAB — HEPARIN LEVEL (UNFRACTIONATED): Heparin Unfractionated: 0.32 [IU]/mL (ref 0.30–0.70)

## 2023-04-25 LAB — CK: Total CK: 187 U/L (ref 49–397)

## 2023-04-25 NOTE — Progress Notes (Signed)
 Occupational Therapy Treatment Patient Details Name: Harold [name] MRN: [id number] DOB: June 14, 1955 Today's Date: 04/25/2023   History of present illness 68 y.o. male presents to [facility] 03/27/23 with productive cough and generalized weakness. Admitted with influenza A and cellulitis of R foot s/p hardware removal of the R ankle and application of wound vac on 2/10, wound vac removed 2/27. Left thigh pain, CT negative. Prior admit 11/24 for R ankle ORIF and in 12/24 for revision. S/p excisional debridement of right ankle on 2/12. Pt underwent central line placement on 04/07/2023 however he has been having issues with recurrent bleeding from the insertion site which has finally stopped. 04/17/23 pt with drop in sats requiring 2 L of O2 via [place], chest xray concerning for edema or possibly pneumonia.Significant PMH: HTN, HLD, CAD s/p PCI, COPD, DM2, CKD stage IIIb, PVD, gout, OSA, neuropathy, COPD, a-fib, CAD s/p percutaneous coronary angioplasty, angina, obesity   OT comments  STAR Patient OT/PT session: Patient states he continues to feel better and was eager to participate. Patient demonstrating gains with bed mobility with mod assist of one from assist of 2 to get to EOB. Patient's BP on EOB 138/62. Patient was able to stand from EOB with min assist of 2 to RW and stated he was feeling "woozy" and BP was 105/90. Abdominal binder donned and patient stood again with BP at 119/71. Patient able to perform step pivot transfer to recliner with RW and min to mod assist +2 with cues for hand placement. BP in recliner 138/62. Patient to continue to be followed by acute OT with STAR program to address established goals to facilitate safe DC.       If plan is discharge home, recommend the following:  A lot of help with walking and/or transfers;A lot of help with bathing/dressing/bathroom;Assistance with cooking/housework;Help with stairs or ramp for entrance;Assist for transportation   Equipment Recommendations   Other (comment) (bariatric BSC)    Recommendations for Other Services      Precautions / Restrictions Precautions Precautions: Fall Recall of Precautions/Restrictions: Impaired Precaution/Restrictions Comments: Watch BP Required Braces or Orthoses: Other Brace Other Brace: R CAM boot for ambulation, R splint in bed, abdominal binder (Prn) Restrictions Weight Bearing Restrictions Per Provider Order: Yes RLE Weight Bearing Per Provider Order: Weight bearing as tolerated Other Position/Activity Restrictions: per Dr. Audrie Lia note 04/03/2023 and orders 04/03/2023 pt WBAT       Mobility Bed Mobility Overal bed mobility: Needs Assistance Bed Mobility: Supine to Sit     Supine to sit: Mod assist, HOB elevated, Used rails     General bed mobility comments: patient used bed features and mod assist to get to EOB with assistance to scoot hips towards EOB    Transfers Overall transfer level: Needs assistance Equipment used: Rolling walker (2 wheels) Transfers: Sit to/from Stand, Bed to chair/wheelchair/BSC Sit to Stand: Min assist, Mod assist, +2 physical assistance     Step pivot transfers: Min assist, Mod assist, +2 physical assistance     General transfer comment: min to mod assist +2 for sit to stands from EOB and for step pivot transfer with cues for hand placement     Balance Overall balance assessment: Needs assistance Sitting-balance support: Feet supported, Bilateral upper extremity supported Sitting balance-Leahy Scale: Poor Sitting balance - Comments: CGA to supervision for sitting balance on EOB Postural control: Posterior lean Standing balance support: Bilateral upper extremity supported, During functional activity, Reliant on assistive device for balance Standing balance-Leahy Scale: Poor Standing balance comment:  reliant on RW for support when standing                           ADL either performed or assessed with clinical judgement   ADL Overall  ADL's : Needs assistance/impaired                 Upper Body Dressing : Sitting;Minimal assistance Upper Body Dressing Details (indicate cue type and reason): donned gown with min assist due to lines Lower Body Dressing: Total assistance;Bed level Lower Body Dressing Details (indicate cue type and reason): donned CAM boot and splint for RLE                    Extremity/Trunk Assessment              Vision       Perception     Praxis     Communication Communication Communication: No apparent difficulties   Cognition Arousal: Alert Behavior During Therapy: WFL for tasks assessed/performed Cognition: No apparent impairments                               Following commands: Impaired Following commands impaired: Follows one step commands with increased time      Cueing   Cueing Techniques: Verbal cues, Tactile cues  Exercises      Shoulder Instructions       General Comments BP seated on EOB 138/62 and when standing without ab binder 105/90. Standing with abdominal binder 119/71 and sitting at end of session 138/62    Pertinent Vitals/ Pain       Pain Assessment Pain Assessment: Faces Faces Pain Scale: Hurts a little bit Pain Location: generalized Pain Descriptors / Indicators: Discomfort, Grimacing Pain Intervention(s): Monitored during session, Repositioned  Home Living                                          Prior Functioning/Environment              Frequency  Min 1X/week        Progress Toward Goals  OT Goals(current goals can now be found in the care plan section)  Progress towards OT goals: Progressing toward goals  Acute Rehab OT Goals Patient Stated Goal: go home OT Goal Formulation: With patient Time For Goal Achievement: 05/01/23 Potential to Achieve Goals: Good ADL Goals Pt Will Perform Grooming: with set-up;sitting Pt Will Perform Lower Body Dressing: with min assist;with adaptive  equipment;with caregiver independent in assisting;sitting/lateral leans;sit to/from stand Pt Will Transfer to Toilet: stand pivot transfer;with min assist;bedside commode Pt Will Perform Toileting - Clothing Manipulation and hygiene: with min assist;sitting/lateral leans;sit to/from stand Pt/caregiver will Perform Home Exercise Program: Increased strength;Both right and left upper extremity;With theraband;With written HEP provided Additional ADL Goal #1: Pt will be Mod I in and OOB for basic ADLs  Plan      Co-evaluation    PT/OT/SLP Co-Evaluation/Treatment: Yes Reason for Co-Treatment: To address functional/ADL transfers;For patient/therapist safety;Other (comment) (activity tolerance) PT goals addressed during session: Mobility/safety with mobility;Proper use of DME;Balance OT goals addressed during session: ADL's and self-care      AM-PAC OT "6 Clicks" Daily Activity     Outcome Measure   Help from another person eating meals?: None Help from another person  taking care of personal grooming?: A Little Help from another person toileting, which includes using toliet, bedpan, or urinal?: A Lot Help from another person bathing (including washing, rinsing, drying)?: A Lot Help from another person to put on and taking off regular upper body clothing?: A Little Help from another person to put on and taking off regular lower body clothing?: Total 6 Click Score: 15    End of Session Equipment Utilized During Treatment: Gait belt;Rolling walker (2 wheels);Other (comment) (abdominal binder)  OT Visit Diagnosis: Unsteadiness on feet (R26.81);Other abnormalities of gait and mobility (R26.89);Muscle weakness (generalized) (M62.81);Pain   Activity Tolerance Patient tolerated treatment well   Patient Left in chair;with call bell/phone within reach;with chair alarm set   Nurse Communication Mobility status;Need for lift equipment        Time: 1221-1251 OT Time Calculation (min): 30  min  Charges: OT General Charges $OT Visit: 1 Visit OT Treatments $Self Care/Home Management : 8-22 mins  Alfonse Flavors, OTA Acute Rehabilitation Services  Office [phone]   Dewain Penning 04/25/2023, 2:34 PM

## 2023-04-25 NOTE — Plan of Care (Signed)

## 2023-04-25 NOTE — Progress Notes (Signed)
   04/25/23 2329  BiPAP/CPAP/SIPAP  $ Non-Invasive Home Ventilator  Subsequent  BiPAP/CPAP/SIPAP Pt Type Adult  BiPAP/CPAP/SIPAP DREAMSTATIOND  Mask Type Full face mask  Dentures removed? Not applicable  Mask Size Large  Respiratory Rate 18 breaths/min  EPAP 8 cmH2O  PEEP 8 cmH20  FiO2 (%) 21 %  Patient Home Equipment No  Auto Titrate No  CPAP/SIPAP surface wiped down Yes  BiPAP/CPAP /SiPAP Vitals  Pulse Rate (!) 56  Resp 18  SpO2 95 %  Bilateral Breath Sounds Clear;Diminished  MEWS Score/Color  MEWS Score 0  MEWS Score Color Green

## 2023-04-25 NOTE — Progress Notes (Signed)
 [facility]                                                                              Harold [name], is a 68 y.o. male, DOB - [birth year], UJW:[id number] Admit date - 03/27/2023    Outpatient Primary MD for the patient is [name], MD  LOS - 29  days  Chief Complaint  Patient presents with   Cough       Brief summary   Patient is a 68 year old male with morbid obesity, OSA, DM2, HTN, HLD, CAD/stent, A-fib on Eliquis, carotid artery stenosis, CKD, chronic anemia, anxiety/depression, diabetic neuropathy, GERD, hypothyroidism, COPD, pulmonary hypertension. 2/6, patient presented to the ED with complaint of cough, subjective fever, myalgia, generalized weakness for 5 days. Respiratory virus panel positive for influenza A. Chest x-ray negative for infiltrates. Also noted to have right leg cellulitis.  04/25/2023: Patient seen.  No new changes.  Assessment & Plan   Acute respiratory failure with hypoxia Influenza A -Originally admitted for influenza A on 03-27-2023.  Presented with 5 days of cough, fever, myalgia.  -Respiratory virus panel positive for influenza A.  -Chest x-ray without acute infiltrate.  Improved with symptomatic treatment, was outside 48-hour window for Tamiflu hence was not initiated.  -wean O2 as tolerated 04/25/2023: Continue supportive care.   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 -S/p right ankle hardware removal, I&D and wound VAC placement on 2/10 by Dr. Jena Gauss -S/p repeat debridement of the right ankle by Dr. Lajoyce Corners 04/02/2023.   -ID was consulted, initially on cefepime, subsequently on daptomycin and ertapenem, now antibiotics held due to worsening renal function.     AKI on CKD stage IIIb:  Nephrotic range proteinuria, uremia, Anion gap metabolic acidosis -Baseline creatinine appears to be  1.9-2.2.   -Renal function has continued to progressively worsen -Nephrology following, renal biopsy deferred due to high risk of bleeding and on Plavix for recent stent for PAD  -TDC placed on 3/4, underwent first HD session  -creatinine continues to rise, 8.14 today, plan for HD today 04/25/2023: For possible hemodialysis tomorrow.   Right facial droop -On 3/1 patient noted to have right facial droop, evaluated by neurology, MRI of the brain negative for acute stroke. -Currently on Plavix.  PAD -Seen by vascular surgery underwent aortogram, right leg angiogram, successful stenting of right superficial femoral artery on 04/01/2023.   -Continue Plavix, Zetia  Acute metabolic encephalopathy -Likely multifactorial, uremia, infection, drug-induced from possibility of cefepime induced encephalopathy, chronic worsening memory issues -Started on hemodialysis Continue Cymbalta, decreased Neurontin 04/25/2023: Encephalopathy seems to have resolved.   Type 2 diabetes mellitus / Diabetic neuropathy, POA: A1c 7 on 01/02/2023. PTA meds-Tresiba 60 units daily, Mounjaro  CBG (last 3)  Recent Labs    04/25/23 0752 04/25/23 1115 04/25/23 1648  GLUCAP 157* 163* 196*   -Continue Semglee, SSI   Diabetic neuropathy -Gabapentin decreased due to worsening renal function. (PTA reportedly on gabapentin 1600 mg 3 times daily)   Hypokalemia/hypomagnesemia: Improved   Paroxysmal A-fib -HR controlled, continue Coreg, amiodarone  -CHADS-VASc  of 4 -Eliquis held secondary to bleeding from CVL recently -Heparin IV resumed   Essential hypertension -BP stable, continue low-dose Coreg   Anemia of chronic disease: Baseline hemoglobin between 10.5 11.5.   -H&H stable, borderline  CAD/stent Carotid artery stenosis HLD Continue PTA meds -Plavix, Crestor -Eliquis held   Hypothyroidism Continue Synthroid   Obstructive sleep apnea CPAP at bedtime    Gout Continue allopurinol, colchicine     Orthostatic hypotension -Received brief IV fluids, continue abdominal binder -2D echo showed EF 55 to 60%, basal inferior hypokinesis, G2 DD    Generalized debility -PT had recommended SNF however patient will have to pay out-of-pocket not able to afford.  Acute diarrhea Resolved   Influenza A:  - Presented with 5 days of cough, fever, myalgia.  - Respiratory virus panel positive for influenza A. Chest x-ray without acute infiltrate -Improved with symptomatic treatment, was outside 48-hour window for Tamiflu hence was not initiated.   Moderate obesity, class II Estimated body mass index is 39.59 kg/m as calculated from the following:   Height as of this encounter: 6\' 1"  (1.854 m).   Weight as of this encounter: 136.1 kg.  Code Status: Full code DVT Prophylaxis:  Place and maintain sequential compression device Start: 04/15/23 1549 SCDs Start: 04/02/23 1617   Level of Care: Level of care: Progressive Cardiac Family Communication:  Disposition Plan:      Remains inpatient appropriate:      Procedures:  2D echo [facility] placement 04/22/23, started on HD #1 on 04/22/23  Consultants:   Renal Ortho ID   Antimicrobials:   Anti-infectives (From admission, onward)    Start     Dose/Rate Route Frequency Ordered Stop   04/22/23 1300  vancomycin (VANCOCIN) IVPB 1000 mg/200 mL premix        1,000 mg 200 mL/hr over 60 Minutes Intravenous To Radiology 04/22/23 1110 04/22/23 1849   04/19/23 1000  ertapenem (INVANZ) 500 mg in sodium chloride 0.9 % 50 mL IVPB  Status:  Discontinued        500 mg 100 mL/hr over 30 Minutes Intravenous Daily 04/18/23 0931 04/18/23 1106   04/17/23 1800  ertapenem (INVANZ) 1 g in sodium chloride 0.9 % 100 mL IVPB  Status:  Discontinued        1 g 200 mL/hr over 30 Minutes Intravenous Daily 04/17/23 1354 04/18/23 0931   04/08/23 0000  daptomycin (CUBICIN) IVPB  Status:  Discontinued        700 mg Intravenous Every 24 hours 04/08/23 1147 04/08/23     04/08/23 0000  ceFEPime (MAXIPIME) IVPB  Status:  Discontinued        2 g Intravenous Every 12 hours 04/08/23 1147 04/18/23    04/08/23 0000  daptomycin (CUBICIN) IVPB  Status:  Discontinued        700 mg Intravenous Every 24 hours 04/08/23 1150 04/18/23    04/04/23 1400  DAPTOmycin (CUBICIN) IVPB 700 mg/155mL premix  Status:  Discontinued        700 mg 200 mL/hr over 30 Minutes Intravenous Daily 04/03/23 1435 04/18/23 1106   04/02/23 1715  ceFAZolin (ANCEF) IVPB 2g/100 mL premix  Status:  Discontinued        2 g 200 mL/hr over 30 Minutes Intravenous Every 8 hours 04/02/23 1616 04/02/23 1623   04/02/23 0600  ceFAZolin (ANCEF) IVPB 3g/150 mL premix  Status:  Discontinued        3 g 300 mL/hr over 30 Minutes Intravenous On call to O.R.  04/01/23 1700 04/02/23 1549   03/31/23 1015  ceFAZolin (ANCEF) IVPB 2g/100 mL premix        2 g 200 mL/hr over 30 Minutes Intravenous On call to O.R. 03/31/23 0921 03/31/23 1203   03/29/23 2200  vancomycin (VANCOREADY) IVPB 2000 mg/400 mL  Status:  Discontinued       Placed in "Followed by" Linked Group   2,000 mg 200 mL/hr over 120 Minutes Intravenous Every 48 hours 03/27/23 2257 04/03/23 1435   03/28/23 1045  metroNIDAZOLE (FLAGYL) tablet 500 mg  Status:  Discontinued        500 mg Oral Every 12 hours 03/28/23 0955 04/03/23 0945   03/27/23 2330  vancomycin (VANCOCIN) 2,500 mg in sodium chloride 0.9 % 500 mL IVPB       Placed in "Followed by" Linked Group   2,500 mg 262.5 mL/hr over 120 Minutes Intravenous  Once 03/27/23 2257 03/28/23 0241   03/27/23 2300  metroNIDAZOLE (FLAGYL) IVPB 500 mg  Status:  Discontinued        500 mg 100 mL/hr over 60 Minutes Intravenous Every 12 hours 03/27/23 2247 03/28/23 0955   03/27/23 2300  ceFEPIme (MAXIPIME) 2 g in sodium chloride 0.9 % 100 mL IVPB  Status:  Discontinued        2 g 200 mL/hr over 30 Minutes Intravenous Every 12 hours 03/27/23 2257 04/17/23 1354   03/27/23 2100  ceFAZolin (ANCEF) IVPB 2g/100 mL premix         2 g 200 mL/hr over 30 Minutes Intravenous  Once 03/27/23 2058 03/27/23 2219          Medications  allopurinol  50 mg Oral Daily   amiodarone  200 mg Oral BID   atorvastatin  40 mg Oral Daily   carvedilol  3.125 mg Oral BID WC   Chlorhexidine Gluconate Cloth  6 each Topical Q0600   clopidogrel  75 mg Oral Q breakfast   docusate sodium  100 mg Oral BID   DULoxetine  60 mg Oral Daily   ezetimibe  10 mg Oral Daily   gabapentin  300 mg Oral QHS   guaiFENesin  600 mg Oral BID   insulin aspart  0-9 Units Subcutaneous Q6H   insulin glargine  40 Units Subcutaneous Daily   levothyroxine  88 mcg Oral QAC breakfast   polyethylene glycol  17 g Oral Daily   sodium chloride flush  3 mL Intravenous Q12H   sodium chloride flush  3-10 mL Intravenous Q12H   tamsulosin  0.4 mg Oral Daily      Subjective:   Harold [name] was seen and examined today.  Somnolent but easily arousable, denies any specific complaints.  No chest pain, shortness of breath, fevers or chills, on CPAP Objective:   Vitals:   04/25/23 0443 04/25/23 0452 04/25/23 1119 04/25/23 1442  BP: 139/61  (!) 146/63 (!) 130/51  Pulse: (!) 58  (!) 58 (!) 57  Resp: 14  16 18   Temp: 98.5 F (36.9 C)  97.9 F (36.6 C) 97.6 F (36.4 C)  TempSrc: Axillary  Oral Oral  SpO2: 96%   97%  Weight:  (!) 136.1 kg    Height:        Intake/Output Summary (Last 24 hours) at 04/25/2023 1854 Last data filed at 04/25/2023 1300 Gross per 24 hour  Intake 206 ml  Output 750 ml  Net -544 ml      Wt Readings from Last 3 Encounters:  04/25/23 (!) 136.1 kg  01/28/23 (!) 142.9 kg  01/09/23 (!) 145 kg    Physical Exam General: Patient is obese.  Awake and alert.  Not in any distress.   Cardiovascular: S1 S2   Respiratory: Clear to auscultation. Gastrointestinal: Obese, soft and nontender. Ext: No significant lower extremity edema.  Right lower leg and foot in a splint.   Data Reviewed:  I have personally reviewed following  labs    CBC Lab Results  Component Value Date   WBC 5.6 04/25/2023   RBC 2.74 (L) 04/25/2023   HGB 8.0 (L) 04/25/2023   HCT 23.3 (L) 04/25/2023   MCV 85.0 04/25/2023   MCH 29.2 04/25/2023   PLT 154 04/25/2023   MCHC 34.3 04/25/2023   RDW 15.9 (H) 04/25/2023   LYMPHSABS 0.9 04/15/2023   MONOABS 0.6 04/15/2023   EOSABS 0.3 04/15/2023   BASOSABS 0.1 04/15/2023     Last metabolic panel Lab Results  Component Value Date   NA 130 (L) 04/25/2023   K 3.5 04/25/2023   CL 99 04/25/2023   CO2 22 04/25/2023   BUN 64 (H) 04/25/2023   CREATININE 7.16 (H) 04/25/2023   GLUCOSE 196 (H) 04/25/2023   GFRNONAA 8 (L) 04/25/2023   GFRAA 47 (L) 09/28/2019   CALCIUM 8.0 (L) 04/25/2023   PHOS 4.6 04/25/2023   PROT 5.8 (L) 04/16/2023   ALBUMIN 2.3 (L) 04/25/2023   LABGLOB 2.2 04/16/2023   AGRATIO 1.7 04/16/2023   BILITOT 0.6 04/16/2023   ALKPHOS 73 04/16/2023   AST 50 (H) 04/16/2023   ALT 42 04/16/2023   ANIONGAP 9 04/25/2023    CBG (last 3)  Recent Labs    04/25/23 0752 04/25/23 1115 04/25/23 1648  GLUCAP 157* 163* 196*      Coagulation Profile: No results for input(s): "INR", "PROTIME" in the last 168 hours.   Radiology Studies: I have personally reviewed the imaging studies  No results found.    Time spent 35 minutes.   Barnetta Chapel M.D. [facility] 04/25/2023, 6:54 PM  Available via Epic secure chat 7am-7pm After 7 pm, please refer to night coverage provider listed on amion.

## 2023-04-25 NOTE — Progress Notes (Addendum)
 Met with pt at bedside this morning to answer additional questions regarding out-pt HD. Pt requesting that navigator contact spouse around 11:00 to discuss information as well. Will attempt to reach spouse around that time. Referral with [facility] pending at this time. Will assist as needed.   Olivia Canter Renal Navigator [phone]  Addendum at 11:43 am: Spoke to pt's spouse per pt's request.   Addendum at 4:05 pm: Pt has been accepted at [facility] on TTS 10:10 am chair time. Pt can start on Tuesday, March 11 if stable for d/c by then. Pt will need to arrive at 9:30 am for paperwork prior to treatment. Met with pt at bedside to discuss the above. Pt agreeable to plan. Schedule letter provided to pt as well. Spoke to pt's spouse via phone to discuss arrangements. Spouse agreeable to plans as well. Update provided to nephrologist.

## 2023-04-25 NOTE — Progress Notes (Signed)
 PHARMACY - ANTICOAGULATION CONSULT NOTE  Pharmacy Consult for heparin Indication: atrial fibrillation  Allergies  Allergen Reactions   Doxycycline Hives and Rash   Septra [Bactrim] Itching   Penicillins Rash    Allergy to All cillin drugs  Ancef given 9/24 with no obvious reaction   Metformin And Related Diarrhea and Nausea Only   Other Nausea And Vomiting    General Anesthesia - sometimes causes nausea and vomiting * OK to use scopolamine patch     Sulfamethoxazole-Trimethoprim Other (See Comments)   Wellbutrin [Bupropion]     Mood Changes    Patient Measurements: Height: 6\' 1"  (185.4 cm) Weight: (!) 136.1 kg (300 lb 0.7 oz) IBW/kg (Calculated) : 79.9 Heparin Dosing Weight: 112.8 kg  Vital Signs: Temp: 98.5 F (36.9 C) (03/07 0443) Temp Source: Axillary (03/07 0443) BP: 139/61 (03/07 0443) Pulse Rate: 58 (03/07 0443)  Labs: Recent Labs    04/23/23 0557 04/23/23 0558 04/23/23 0558 04/24/23 0400 04/24/23 1733 04/25/23 0442  HGB 7.7*  --   --  7.8*  --  8.0*  HCT 22.2*  --   --  22.5*  --  23.3*  PLT 136*  --   --  136*  --  154  APTT 44*  --   --  54*  --   --   HEPARINUNFRC  --  0.27*   < > 0.25* 0.28* 0.32  CREATININE  --  7.18*  --  8.14*  --  7.16*  CKTOTAL  --   --   --   --   --  187   < > = values in this interval not displayed.    Estimated Creatinine Clearance: 14.1 mL/min (A) (by C-G formula based on SCr of 7.16 mg/dL (H)).   Medical History: Past Medical History:  Diagnosis Date   Anemia    Anginal pain (HCC) 04/2013   Cardiac cath showed patent stents with distal LAD, circumflex-OM and RCA disease in small vessels.   Anxiety    Atrial fibrillation (HCC) 12/2021   CAD S/P percutaneous coronary angioplasty 2003, 04/2012   status post PCI to LAD, circumflex-OM 2, RCA   Carotid artery occlusion    Chronic renal insufficiency, stage II (mild)    Chronic venous insufficiency    varicosities, no reflux; dopplers 04/14/12- valvular insufficiency in  the R and L GSV   Complication of anesthesia    COPD, mild (HCC)    Depression    situaltional    Diabetes mellitus    Diabetic neuropathy (HCC) 08/24/2019   Dyslipidemia associated with type 2 diabetes mellitus (HCC)    Fall at home, initial encounter 01/02/2023   GERD (gastroesophageal reflux disease)    History of cardioversion 12/2021   at [facility]   HTN (hypertension)    Hypothyroidism    Neuromuscular disorder (HCC)    neuropathy in feet   Neuropathy    notably improved following PCI with improved cardiac function   Obesity    Obesity, Class II, BMI 35.0-39.9, with comorbidity (see actual BMI)    BMI 39; wgt loss efforts in place; seeing Dietician   Open ankle fracture 01/02/2023   Orthostatic hypotension    PONV (postoperative nausea and vomiting)    "[employer]"   Pulmonary hypertension (HCC) 04/2012   PA pressure   Sleep apnea    uses nightly   Vitamin D deficiency    per facility notes    Medications:  Scheduled:   allopurinol  50  mg Oral Daily   amiodarone  200 mg Oral BID   atorvastatin  40 mg Oral Daily   carvedilol  3.125 mg Oral BID WC   Chlorhexidine Gluconate Cloth  6 each Topical Q0600   clopidogrel  75 mg Oral Q breakfast   docusate sodium  100 mg Oral BID   DULoxetine  60 mg Oral Daily   ezetimibe  10 mg Oral Daily   gabapentin  300 mg Oral QHS   guaiFENesin  600 mg Oral BID   insulin aspart  0-9 Units Subcutaneous Q6H   insulin glargine  40 Units Subcutaneous Daily   levothyroxine  88 mcg Oral QAC breakfast   polyethylene glycol  17 g Oral Daily   sodium chloride flush  3 mL Intravenous Q12H   sodium chloride flush  3-10 mL Intravenous Q12H   tamsulosin  0.4 mg Oral Daily   Infusions:   heparin 1,450 Units/hr (04/25/23 0615)     Assessment: 68 y/o male with history of afib on apixaban. Eliquis has been held d/t bleeding from CVL site. IV Heparin was started but then stopped 3/2 due to bleeding from the IV line and has remained  on hold for HD cath placement. Plans are to resume IV heparin then resume apixaban later  -heparin level= 0.32 and at goal on 1450 units/hr -hg= 8.0  Goal of Therapy:  Heparin level= 0.3-0.5 units/mL Monitor platelets by anticoagulation protocol: Yes   Plan:  Continue heparin 1450 units/hr Daily heparin level and CBC.  Harland German, PharmD Clinical Pharmacist **Pharmacist phone directory can now be found on [email or web address] ([email or web address]).  Listed under [facility].     [facility] pharmacy phone numbers are listed on [email or web address]

## 2023-04-25 NOTE — TOC Progression Note (Signed)
 Transition of Care [facility]) - Progression Note    Patient Details  Name: SHAUNE WESTFALL MRN: [id number] Date of Birth: 1955-12-20  [facility] CM/SW Contact  Graves-Bigelow, Lamar Laundry, RN Phone Number: 04/25/2023, 11:42 AM  Clinical Narrative: Patient now dialysis dependent-renal navigator is following for CLIP- for outpatient HD center. CSW to speak with patient regarding transportation resources. Patient will have [facility] for home health services once stable to transition home. Call placed to [facility] for IV antibiotics. Case Manager continuing to follow for transition of care needs.    Expected Discharge Plan: Home w Home Health Services Barriers to Discharge: No Barriers Identified  Expected Discharge Plan and Services In-house Referral: Clinical Social Work Discharge Planning Services: CM Consult Post Acute Care Choice: Home Health Living arrangements for the past 2 months: Single Family Home Expected Discharge Date: 04/10/23               DME Arranged: IV pump/equipment DME Agency:  [facility] Rice)     HH Arranged: RN, Disease Management, PT, OT, Nurse's Aide HH Agency: [facility] ([facility])  Social Determinants of Health (SDOH) Interventions SDOH Screenings   Food Insecurity: No Food Insecurity (03/27/2023)  Housing: Low Risk  (03/28/2023)  Transportation Needs: No Transportation Needs (03/27/2023)  Utilities: Not At Risk (03/28/2023)  Social Connections: Unknown (03/28/2023)  Tobacco Use: Medium Risk (04/02/2023)    Readmission Risk Interventions     No data to display

## 2023-04-25 NOTE — Progress Notes (Signed)
 Admit: 03/27/2023 LOS: 29  23M with AKI on CKD3 likely from ATN  Subjective:  Second HD yesterday with 1 L UF 0.8 L UOP, picking up K3.5, BUN 64 AKI clip in process   03/06 0701 - 03/07 0700 In: 6 [I.V.:6] Out: 1750 [Urine:750]  Filed Weights   04/24/23 1450 04/24/23 1730 04/25/23 0452  Weight: 134 kg 133 kg (!) 136.1 kg    Scheduled Meds:  allopurinol  50 mg Oral Daily   amiodarone  200 mg Oral BID   atorvastatin  40 mg Oral Daily   carvedilol  3.125 mg Oral BID WC   Chlorhexidine Gluconate Cloth  6 each Topical Q0600   clopidogrel  75 mg Oral Q breakfast   docusate sodium  100 mg Oral BID   DULoxetine  60 mg Oral Daily   ezetimibe  10 mg Oral Daily   gabapentin  300 mg Oral QHS   guaiFENesin  600 mg Oral BID   insulin aspart  0-9 Units Subcutaneous Q6H   insulin glargine  40 Units Subcutaneous Daily   levothyroxine  88 mcg Oral QAC breakfast   polyethylene glycol  17 g Oral Daily   sodium chloride flush  3 mL Intravenous Q12H   sodium chloride flush  3-10 mL Intravenous Q12H   tamsulosin  0.4 mg Oral Daily   Continuous Infusions:  heparin 1,450 Units/hr (04/25/23 0615)    PRN Meds:.acetaminophen **OR** acetaminophen, calcium carbonate, hydrALAZINE, melatonin, methocarbamol **OR** methocarbamol (ROBAXIN) injection, metoCLOPramide **OR** metoCLOPramide (REGLAN) injection, naLOXone (NARCAN)  injection, ondansetron (ZOFRAN) IV, sodium chloride flush, sodium chloride flush  Current Labs: reviewed   Physical Exam:  Blood pressure 139/61, pulse (!) 58, temperature 98.5 F (36.9 C), temperature source Axillary, resp. rate 14, height 6\' 1"  (1.854 m), weight (!) 136.1 kg, SpO2 96%. NAD, awake, alert, conversant Regular, normal S1 and S2 Clear bilaterally Lower extremity edema is trace, ankle hardware present [facility] c/d/i  A Dialysis dependent Anuric AKI on CKD 3; ATN and AIN both possible Hypertension with tendency towards orthostasis on low-dose carvedilol and  tamsulosin Nephrotic range proteinuria with negative serological evaluation but decision against biopsy at the current time per previous notes Right lower extremity infection with involvement of orthopedic hardware status post removal and antibiotics discontinued 2/28 with concern for AIN Chronic anemia, hemoglobin in the 7s Mild hyponatremia, dialysis will address Increased anion gap metabolic acidosis  P Has received 2 dialysis treatments Urine output might be improving here, we will not plan for another dialysis treatment until 3/10 and watch over the weekend.  That is of course unless labs or volume status req more quick treatment CLIP as AKI in process Medication Issues; Preferred narcotic agents for pain control are hydromorphone, fentanyl, and methadone. Morphine should not be used.  Baclofen should be avoided Avoid oral sodium phosphate and magnesium citrate based laxatives / bowel preps    Sabra Heck MD 04/25/2023, 10:40 AM  Recent Labs  Lab 04/23/23 0558 04/24/23 0400 04/25/23 0442  NA 130* 131* 130*  K 3.5 3.6 3.5  CL 96* 97* 99  CO2 22 19* 22  GLUCOSE 130* 184* 196*  BUN 72* 82* 64*  CREATININE 7.18* 8.14* 7.16*  CALCIUM 8.3* 8.3* 8.0*  PHOS 4.6 5.1* 4.6   Recent Labs  Lab 04/23/23 0557 04/24/23 0400 04/25/23 0442  WBC 4.9 5.2 5.6  HGB 7.7* 7.8* 8.0*  HCT 22.2* 22.5* 23.3*  MCV 84.4 84.9 85.0  PLT 136* 136* 154

## 2023-04-25 NOTE — Progress Notes (Signed)
 Physical Therapy Treatment Patient Details Name: Harold [name] MRN: [id number] DOB: 01-Nov-1955 Today's Date: 04/25/2023   History of Present Illness 68 y.o. male presents to [facility] 03/27/23 with productive cough and generalized weakness. Admitted with influenza A and cellulitis of R foot s/p hardware removal of the R ankle and application of wound vac on 2/10, wound vac removed 2/27. Left thigh pain, CT negative. Prior admit 11/24 for R ankle ORIF and in 12/24 for revision. S/p excisional debridement of right ankle on 2/12. Pt underwent central line placement on 04/07/2023 however he has been having issues with recurrent bleeding from the insertion site which has finally stopped. 04/17/23 pt with drop in sats requiring 2 L of O2 via [place], chest xray concerning for edema or possibly pneumonia.Significant PMH: HTN, HLD, CAD s/p PCI, COPD, DM2, CKD stage IIIb, PVD, gout, OSA, neuropathy, COPD, a-fib, CAD s/p percutaneous coronary angioplasty, angina, obesity    PT Comments  STAR PT/OT Session: Pt looks much better after getting HD, and is better able to participate. Pt continues to have dizziness and drop in BP with positional change, (see General Comments). Pt benefits from use of abdominal binder. Limitations currently more related to generalized weakness. Pt is currently modA for bed mobility and modAx2 for sit to stand and pivot transfer to recliner. PT/OT will work on progressing ambulation next week for ultimate discharge home.     If plan is discharge home, recommend the following: A lot of help with walking and/or transfers;A lot of help with bathing/dressing/bathroom;Assistance with cooking/housework;Assist for transportation;Help with stairs or ramp for entrance   Can travel by private vehicle     Yes  Equipment Recommendations  BSC/3in1       Precautions / Restrictions Precautions Precautions: Fall Recall of Precautions/Restrictions: Impaired Precaution/Restrictions Comments: Watch  BP Required Braces or Orthoses: Other Brace Other Brace: R CAM boot for ambulation, R splint in bed, abdominal binder (Prn) Restrictions Weight Bearing Restrictions Per Provider Order: Yes RLE Weight Bearing Per Provider Order: Weight bearing as tolerated Other Position/Activity Restrictions: per Dr. Audrie Lia note 04/03/2023 and orders 04/03/2023 pt WBAT     Mobility  Bed Mobility Overal bed mobility: Needs Assistance Bed Mobility: Supine to Sit     Supine to sit: Mod assist, HOB elevated, Used rails     General bed mobility comments: patient used bed features and mod assist to get to EOB with assistance to scoot hips towards EOB    Transfers Overall transfer level: Needs assistance Equipment used: Rolling walker (2 wheels) Transfers: Sit to/from Stand, Bed to chair/wheelchair/BSC Sit to Stand: Min assist, Mod assist, +2 physical assistance   Step pivot transfers: Min assist, Mod assist, +2 physical assistance       General transfer comment: min to mod assist +2 for sit to stands from EOB and for step pivot transfer with cues for hand placement          Balance Overall balance assessment: Needs assistance Sitting-balance support: Feet supported, Bilateral upper extremity supported Sitting balance-Leahy Scale: Poor Sitting balance - Comments: CGA to supervision for sitting balance on EOB Postural control: Posterior lean Standing balance support: Bilateral upper extremity supported, During functional activity, Reliant on assistive device for balance Standing balance-Leahy Scale: Poor Standing balance comment: reliant on RW for support when standing                            Communication Communication Communication: No apparent difficulties  Cognition  Arousal: Alert Behavior During Therapy: WFL for tasks assessed/performed                             Following commands: Impaired Following commands impaired: Follows one step commands with  increased time    Cueing Cueing Techniques: Verbal cues, Tactile cues     General Comments General comments (skin integrity, edema, etc.): BP seated on EOB 138/62 and when standing without ab binder 105/90. Standing with abdominal binder 119/71 and sitting at end of session 138/62      Pertinent Vitals/Pain Pain Assessment Pain Assessment: Faces Faces Pain Scale: Hurts a little bit Pain Location: generalized Pain Descriptors / Indicators: Discomfort, Grimacing Pain Intervention(s): Monitored during session, Repositioned     PT Goals (current goals can now be found in the care plan section) Acute Rehab PT Goals PT Goal Formulation: With patient Time For Goal Achievement: 05/01/23 Potential to Achieve Goals: Fair Progress towards PT goals: Progressing toward goals    Frequency    Min 1X/week           Co-evaluation PT/OT/SLP Co-Evaluation/Treatment: Yes Reason for Co-Treatment: To address functional/ADL transfers;For patient/therapist safety;Other (comment) (activity tolerance) PT goals addressed during session: Mobility/safety with mobility;Proper use of DME;Balance OT goals addressed during session: ADL's and self-care      AM-PAC PT "6 Clicks" Mobility   Outcome Measure  Help needed turning from your back to your side while in a flat bed without using bedrails?: A Little Help needed moving from lying on your back to sitting on the side of a flat bed without using bedrails?: A Lot Help needed moving to and from a bed to a chair (including a wheelchair)?: A Lot Help needed standing up from a chair using your arms (e.g., wheelchair or bedside chair)?: A Lot Help needed to walk in hospital room?: Total Help needed climbing 3-5 steps with a railing? : Total 6 Click Score: 11    End of Session Equipment Utilized During Treatment: Gait belt Activity Tolerance: Treatment limited secondary to medical complications (Comment) (decreased BP) Patient left: in bed;with call  bell/phone within reach;with bed alarm set Nurse Communication: Mobility status PT Visit Diagnosis: Other abnormalities of gait and mobility (R26.89);Unsteadiness on feet (R26.81);Muscle weakness (generalized) (M62.81);History of falling (Z91.81);Difficulty in walking, not elsewhere classified (R26.2)     Time: 8657-8469 PT Time Calculation (min) (ACUTE ONLY): 26 min  Charges:    $Therapeutic Activity: 8-22 mins PT General Charges $$ ACUTE PT VISIT: 1 Visit                     Vance Belcourt B. Beverely Risen PT, DPT Acute Rehabilitation Services Please use secure chat or  Call Office [phone]     Elon Alas [facility] 04/25/2023, 3:05 PM

## 2023-04-26 DIAGNOSIS — L02415 Cutaneous abscess of right lower limb: Secondary | ICD-10-CM | POA: Diagnosis not present

## 2023-04-26 LAB — CBC
HCT: 22.7 % — ABNORMAL LOW (ref 39.0–52.0)
Hemoglobin: 7.9 g/dL — ABNORMAL LOW (ref 13.0–17.0)
MCH: 29.8 pg (ref 26.0–34.0)
MCHC: 34.8 g/dL (ref 30.0–36.0)
MCV: 85.7 fL (ref 80.0–100.0)
Platelets: 171 10*3/uL (ref 150–400)
RBC: 2.65 MIL/uL — ABNORMAL LOW (ref 4.22–5.81)
RDW: 15.9 % — ABNORMAL HIGH (ref 11.5–15.5)
WBC: 6.5 10*3/uL (ref 4.0–10.5)
nRBC: 0 % (ref 0.0–0.2)

## 2023-04-26 LAB — GLUCOSE, CAPILLARY
Glucose-Capillary: 123 mg/dL — ABNORMAL HIGH (ref 70–99)
Glucose-Capillary: 127 mg/dL — ABNORMAL HIGH (ref 70–99)
Glucose-Capillary: 129 mg/dL — ABNORMAL HIGH (ref 70–99)
Glucose-Capillary: 148 mg/dL — ABNORMAL HIGH (ref 70–99)
Glucose-Capillary: 159 mg/dL — ABNORMAL HIGH (ref 70–99)

## 2023-04-26 LAB — RENAL FUNCTION PANEL
Albumin: 2.3 g/dL — ABNORMAL LOW (ref 3.5–5.0)
Anion gap: 12 (ref 5–15)
BUN: 63 mg/dL — ABNORMAL HIGH (ref 8–23)
CO2: 24 mmol/L (ref 22–32)
Calcium: 8.7 mg/dL — ABNORMAL LOW (ref 8.9–10.3)
Chloride: 98 mmol/L (ref 98–111)
Creatinine, Ser: 8 mg/dL — ABNORMAL HIGH (ref 0.61–1.24)
GFR, Estimated: 7 mL/min — ABNORMAL LOW (ref >60)
Glucose, Bld: 137 mg/dL — ABNORMAL HIGH (ref 70–99)
Phosphorus: 4.9 mg/dL — ABNORMAL HIGH (ref 2.5–4.6)
Potassium: 3.5 mmol/L (ref 3.5–5.1)
Sodium: 134 mmol/L — ABNORMAL LOW (ref 135–145)

## 2023-04-26 LAB — HEPARIN LEVEL (UNFRACTIONATED): Heparin Unfractionated: 0.31 [IU]/mL (ref 0.30–0.70)

## 2023-04-26 NOTE — Plan of Care (Signed)
  Problem: Education: Goal: Knowledge of General Education information will improve Description: Including pain rating scale, medication(s)/side effects and non-pharmacologic comfort measures Outcome: Progressing   Problem: Health Behavior/Discharge Planning: Goal: Ability to manage health-related needs will improve Outcome: Progressing   Problem: Clinical Measurements: Goal: Ability to maintain clinical measurements within normal limits will improve Outcome: Progressing Goal: Will remain free from infection Outcome: Progressing Goal: Diagnostic test results will improve Outcome: Progressing Goal: Cardiovascular complication will be avoided Outcome: Progressing   Problem: Activity: Goal: Risk for activity intolerance will decrease Outcome: Progressing   Problem: Nutrition: Goal: Adequate nutrition will be maintained Outcome: Progressing   Problem: Elimination: Goal: Will not experience complications related to bowel motility Outcome: Progressing   Problem: Pain Managment: Goal: General experience of comfort will improve and/or be controlled Outcome: Progressing

## 2023-04-26 NOTE — Progress Notes (Signed)
 [facility]                                                                              Harold [name], is a 68 y.o. male, DOB - [birth year], ZOX:[id number] Admit date - 03/27/2023    Outpatient Primary MD for the patient is [name], MD  LOS - 30  days  Chief Complaint  Patient presents with   Cough       Brief summary  Patient is a 68 year old male with morbid obesity, OSA, DM2, HTN, HLD, CAD/stent, A-fib on Eliquis, carotid artery stenosis, CKD, chronic anemia, anxiety/depression, diabetic neuropathy, GERD, hypothyroidism, COPD, pulmonary hypertension. 2/6, patient presented to the ED with complaint of cough, subjective fever, myalgia, generalized weakness for 5 days. Respiratory virus panel positive for influenza A. Chest x-ray negative for infiltrates. Also noted to have right leg cellulitis.  04/26/2023: Patient seen.  No new changes.  Assessment & Plan   Acute respiratory failure with hypoxia Influenza A -Originally admitted for influenza A on 03-27-2023.  Presented with 5 days of cough, fever, myalgia.  -Respiratory virus panel positive for influenza A.  -Chest x-ray without acute infiltrate.  Improved with symptomatic treatment, was outside 48-hour window for Tamiflu hence was not initiated.  -wean O2 as tolerated 04/26/2023: Continue supportive care.   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 -S/p right ankle hardware removal, I&D and wound VAC placement on 2/10 by Dr. Jena Gauss -S/p repeat debridement of the right ankle by Dr. Lajoyce Corners 04/02/2023.   -ID was consulted, initially on cefepime, subsequently on daptomycin and ertapenem, now antibiotics held due to worsening renal function.  04/26/2023: Communicated with infectious disease team.  Patient has been monitored off of antibiotics for now.   AKI on CKD stage IIIb:   Nephrotic range proteinuria, uremia, Anion gap metabolic acidosis -Baseline creatinine appears to be 1.9-2.2.   -Renal function has continued to progressively worsen -Nephrology following, renal biopsy deferred due to high risk of bleeding and on Plavix for recent stent for PAD  -TDC placed on 3/4, underwent first HD session  -creatinine continues to rise, 8.14 today, plan for HD today 04/26/2023: Continue hemodialysis as per nephrology team.    Right facial droop -On 3/1 patient noted to have right facial droop, evaluated by neurology, MRI of the brain negative for acute stroke. -Currently on Plavix.  PAD -Seen by vascular surgery underwent aortogram, right leg angiogram, successful stenting of right superficial femoral artery on 04/01/2023.   -Continue Plavix, Zetia  Acute metabolic encephalopathy -Likely multifactorial, uremia, infection, drug-induced from possibility of cefepime induced encephalopathy, chronic worsening memory issues -Started on hemodialysis Continue Cymbalta, decreased Neurontin 04/26/2023: Encephalopathy seems to have resolved.   Type 2 diabetes mellitus / Diabetic neuropathy, POA: A1c 7 on 01/02/2023. PTA meds-Tresiba 60 units daily, Mounjaro  CBG (last 3)  Recent Labs    04/26/23 0643 04/26/23 0749 04/26/23 1138  GLUCAP 123* 127* 129*   -Continue Semglee, SSI   Diabetic neuropathy -Gabapentin decreased due to worsening renal function. (PTA reportedly on gabapentin 1600 mg 3  times daily)   Hypokalemia/hypomagnesemia: Improved   Paroxysmal A-fib -HR controlled, continue Coreg, amiodarone  -CHADS-VASc of 4 -Eliquis held secondary to bleeding from CVL recently -Heparin IV resumed   Essential hypertension -BP stable, continue low-dose Coreg   Anemia of chronic disease: Baseline hemoglobin between 10.5 11.5.   -H&H stable, borderline  CAD/stent Carotid artery stenosis HLD Continue PTA meds -Plavix, Crestor -Eliquis held    Hypothyroidism Continue Synthroid   Obstructive sleep apnea CPAP at bedtime    Gout Continue allopurinol, colchicine    Orthostatic hypotension -Received brief IV fluids, continue abdominal binder -2D echo showed EF 55 to 60%, basal inferior hypokinesis, G2 DD    Generalized debility -PT had recommended SNF however patient will have to pay out-of-pocket not able to afford.  Acute diarrhea Resolved   Influenza A:  - Presented with 5 days of cough, fever, myalgia.  - Respiratory virus panel positive for influenza A. Chest x-ray without acute infiltrate -Improved with symptomatic treatment, was outside 48-hour window for Tamiflu hence was not initiated.   Moderate obesity, class II Estimated body mass index is 38.51 kg/m as calculated from the following:   Height as of this encounter: 6\' 1"  (1.854 m).   Weight as of this encounter: 132.4 kg.  Code Status: Full code DVT Prophylaxis:  Place and maintain sequential compression device Start: 04/15/23 1549 SCDs Start: 04/02/23 1617   Level of Care: Level of care: Progressive Cardiac Family Communication:  Disposition Plan:      Remains inpatient appropriate:      Procedures:  2D echo [facility] placement 04/22/23, started on HD #1 on 04/22/23  Consultants:   Renal Ortho ID   Antimicrobials:   Anti-infectives (From admission, onward)    Start     Dose/Rate Route Frequency Ordered Stop   04/22/23 1300  vancomycin (VANCOCIN) IVPB 1000 mg/200 mL premix        1,000 mg 200 mL/hr over 60 Minutes Intravenous To Radiology 04/22/23 1110 04/22/23 1849   04/19/23 1000  ertapenem (INVANZ) 500 mg in sodium chloride 0.9 % 50 mL IVPB  Status:  Discontinued        500 mg 100 mL/hr over 30 Minutes Intravenous Daily 04/18/23 0931 04/18/23 1106   04/17/23 1800  ertapenem (INVANZ) 1 g in sodium chloride 0.9 % 100 mL IVPB  Status:  Discontinued        1 g 200 mL/hr over 30 Minutes Intravenous Daily 04/17/23 1354 04/18/23 0931   04/08/23  0000  daptomycin (CUBICIN) IVPB  Status:  Discontinued        700 mg Intravenous Every 24 hours 04/08/23 1147 04/08/23    04/08/23 0000  ceFEPime (MAXIPIME) IVPB  Status:  Discontinued        2 g Intravenous Every 12 hours 04/08/23 1147 04/18/23    04/08/23 0000  daptomycin (CUBICIN) IVPB  Status:  Discontinued        700 mg Intravenous Every 24 hours 04/08/23 1150 04/18/23    04/04/23 1400  DAPTOmycin (CUBICIN) IVPB 700 mg/133mL premix  Status:  Discontinued        700 mg 200 mL/hr over 30 Minutes Intravenous Daily 04/03/23 1435 04/18/23 1106   04/02/23 1715  ceFAZolin (ANCEF) IVPB 2g/100 mL premix  Status:  Discontinued        2 g 200 mL/hr over 30 Minutes Intravenous Every 8 hours 04/02/23 1616 04/02/23 1623   04/02/23 0600  ceFAZolin (ANCEF) IVPB 3g/150 mL premix  Status:  Discontinued  3 g 300 mL/hr over 30 Minutes Intravenous On call to O.R. 04/01/23 1700 04/02/23 1549   03/31/23 1015  ceFAZolin (ANCEF) IVPB 2g/100 mL premix        2 g 200 mL/hr over 30 Minutes Intravenous On call to O.R. 03/31/23 0921 03/31/23 1203   03/29/23 2200  vancomycin (VANCOREADY) IVPB 2000 mg/400 mL  Status:  Discontinued       Placed in "Followed by" Linked Group   2,000 mg 200 mL/hr over 120 Minutes Intravenous Every 48 hours 03/27/23 2257 04/03/23 1435   03/28/23 1045  metroNIDAZOLE (FLAGYL) tablet 500 mg  Status:  Discontinued        500 mg Oral Every 12 hours 03/28/23 0955 04/03/23 0945   03/27/23 2330  vancomycin (VANCOCIN) 2,500 mg in sodium chloride 0.9 % 500 mL IVPB       Placed in "Followed by" Linked Group   2,500 mg 262.5 mL/hr over 120 Minutes Intravenous  Once 03/27/23 2257 03/28/23 0241   03/27/23 2300  metroNIDAZOLE (FLAGYL) IVPB 500 mg  Status:  Discontinued        500 mg 100 mL/hr over 60 Minutes Intravenous Every 12 hours 03/27/23 2247 03/28/23 0955   03/27/23 2300  ceFEPIme (MAXIPIME) 2 g in sodium chloride 0.9 % 100 mL IVPB  Status:  Discontinued        2 g 200 mL/hr over 30  Minutes Intravenous Every 12 hours 03/27/23 2257 04/17/23 1354   03/27/23 2100  ceFAZolin (ANCEF) IVPB 2g/100 mL premix        2 g 200 mL/hr over 30 Minutes Intravenous  Once 03/27/23 2058 03/27/23 2219          Medications  allopurinol  50 mg Oral Daily   amiodarone  200 mg Oral BID   atorvastatin  40 mg Oral Daily   carvedilol  3.125 mg Oral BID WC   Chlorhexidine Gluconate Cloth  6 each Topical Q0600   clopidogrel  75 mg Oral Q breakfast   docusate sodium  100 mg Oral BID   DULoxetine  60 mg Oral Daily   ezetimibe  10 mg Oral Daily   gabapentin  300 mg Oral QHS   guaiFENesin  600 mg Oral BID   insulin aspart  0-9 Units Subcutaneous Q6H   insulin glargine  40 Units Subcutaneous Daily   levothyroxine  88 mcg Oral QAC breakfast   polyethylene glycol  17 g Oral Daily   sodium chloride flush  3 mL Intravenous Q12H   sodium chloride flush  3-10 mL Intravenous Q12H   tamsulosin  0.4 mg Oral Daily      Subjective:   Harold [name] was seen and examined today.  Somnolent but easily arousable, denies any specific complaints.  No chest pain, shortness of breath, fevers or chills, on CPAP Objective:   Vitals:   04/26/23 0900 04/26/23 1000 04/26/23 1100 04/26/23 1129  BP:    126/60  Pulse: (!) 55 (!) 51 (!) 53 (!) 51  Resp:    20  Temp:    (!) 97.4 F (36.3 C)  TempSrc:    Axillary  SpO2: 100% 99%  100%  Weight:      Height:        Intake/Output Summary (Last 24 hours) at 04/26/2023 1446 Last data filed at 04/26/2023 1100 Gross per 24 hour  Intake 1149.41 ml  Output 0 ml  Net 1149.41 ml      Wt Readings from Last 3 Encounters:  04/26/23  132.4 kg  01/28/23 (!) 142.9 kg  01/09/23 (!) 145 kg    Physical Exam General: Patient is obese.  Awake and alert.  Not in any distress.   Cardiovascular: S1 S2   Respiratory: Clear to auscultation. Gastrointestinal: Obese, soft and nontender. Ext: No significant lower extremity edema.  Right lower leg and foot in a  splint.   Data Reviewed:  I have personally reviewed following labs    CBC Lab Results  Component Value Date   WBC 6.5 04/26/2023   RBC 2.65 (L) 04/26/2023   HGB 7.9 (L) 04/26/2023   HCT 22.7 (L) 04/26/2023   MCV 85.7 04/26/2023   MCH 29.8 04/26/2023   PLT 171 04/26/2023   MCHC 34.8 04/26/2023   RDW 15.9 (H) 04/26/2023   LYMPHSABS 0.9 04/15/2023   MONOABS 0.6 04/15/2023   EOSABS 0.3 04/15/2023   BASOSABS 0.1 04/15/2023     Last metabolic panel Lab Results  Component Value Date   NA 134 (L) 04/26/2023   K 3.5 04/26/2023   CL 98 04/26/2023   CO2 24 04/26/2023   BUN 63 (H) 04/26/2023   CREATININE 8.00 (H) 04/26/2023   GLUCOSE 137 (H) 04/26/2023   GFRNONAA 7 (L) 04/26/2023   GFRAA 47 (L) 09/28/2019   CALCIUM 8.7 (L) 04/26/2023   PHOS 4.9 (H) 04/26/2023   PROT 5.8 (L) 04/16/2023   ALBUMIN 2.3 (L) 04/26/2023   LABGLOB 2.2 04/16/2023   AGRATIO 1.7 04/16/2023   BILITOT 0.6 04/16/2023   ALKPHOS 73 04/16/2023   AST 50 (H) 04/16/2023   ALT 42 04/16/2023   ANIONGAP 12 04/26/2023    CBG (last 3)  Recent Labs    04/26/23 0643 04/26/23 0749 04/26/23 1138  GLUCAP 123* 127* 129*      Coagulation Profile: No results for input(s): "INR", "PROTIME" in the last 168 hours.   Radiology Studies: I have personally reviewed the imaging studies  No results found.    Time spent 35 minutes.   Barnetta Chapel M.D. [facility] 04/26/2023, 2:46 PM  Available via Epic secure chat 7am-7pm After 7 pm, please refer to night coverage provider listed on amion.

## 2023-04-26 NOTE — Progress Notes (Signed)
 Admit: 03/27/2023 LOS: 30  8M with AKI on CKD3 likely from ATN  Subjective:  No overnight events UOP was not recorded Today's labs with stable BUN and slight increase in serum creatinine of, K3.5 Has outpt CLIP [facility] at [facility], can start 3/11  03/07 0701 - 03/08 0700 In: 960.8 [P.O.:200; I.V.:760.8] Out: -   Filed Weights   04/24/23 1730 04/25/23 0452 04/26/23 0409  Weight: 133 kg (!) 136.1 kg 132.4 kg    Scheduled Meds:  allopurinol  50 mg Oral Daily   amiodarone  200 mg Oral BID   atorvastatin  40 mg Oral Daily   carvedilol  3.125 mg Oral BID WC   Chlorhexidine Gluconate Cloth  6 each Topical Q0600   clopidogrel  75 mg Oral Q breakfast   docusate sodium  100 mg Oral BID   DULoxetine  60 mg Oral Daily   ezetimibe  10 mg Oral Daily   gabapentin  300 mg Oral QHS   guaiFENesin  600 mg Oral BID   insulin aspart  0-9 Units Subcutaneous Q6H   insulin glargine  40 Units Subcutaneous Daily   levothyroxine  88 mcg Oral QAC breakfast   polyethylene glycol  17 g Oral Daily   sodium chloride flush  3 mL Intravenous Q12H   sodium chloride flush  3-10 mL Intravenous Q12H   tamsulosin  0.4 mg Oral Daily   Continuous Infusions:  heparin 1,450 Units/hr (04/26/23 0837)    PRN Meds:.acetaminophen **OR** acetaminophen, calcium carbonate, hydrALAZINE, melatonin, methocarbamol **OR** methocarbamol (ROBAXIN) injection, metoCLOPramide **OR** metoCLOPramide (REGLAN) injection, naLOXone (NARCAN)  injection, ondansetron (ZOFRAN) IV, sodium chloride flush, sodium chloride flush  Current Labs: reviewed   Physical Exam:  Blood pressure (!) 148/62, pulse (!) 51, temperature (!) 97.3 F (36.3 C), temperature source Oral, resp. rate (!) 22, height 6\' 1"  (1.854 m), weight 132.4 kg, SpO2 97%. NAD, awake, alert, conversant Regular, normal S1 and S2 Clear bilaterally Lower extremity edema is trace, ankle hardware present [facility] c/d/i  A Dialysis dependent Anuric AKI on CKD 3; ATN and AIN both  possible CLIP as AKI to [place] start 3/11 if needed Hypertension with tendency towards orthostasis on low-dose carvedilol and tamsulosin Nephrotic range proteinuria with negative serological evaluation but decision against biopsy at the current time per previous notes Right lower extremity infection with involvement of orthopedic hardware status post removal and antibiotics discontinued 2/28 with concern for AIN Chronic anemia, hemoglobin in the 7s Mild hyponatremia, dialysis will address Increased anion gap metabolic acidosis  P Holding dialysis today to trend UOP and labs over the weekend Medication Issues; Preferred narcotic agents for pain control are hydromorphone, fentanyl, and methadone. Morphine should not be used.  Baclofen should be avoided Avoid oral sodium phosphate and magnesium citrate based laxatives / bowel preps    Sabra Heck MD 04/26/2023, 9:56 AM  Recent Labs  Lab 04/24/23 0400 04/25/23 0442 04/26/23 0705  NA 131* 130* 134*  K 3.6 3.5 3.5  CL 97* 99 98  CO2 19* 22 24  GLUCOSE 184* 196* 137*  BUN 82* 64* 63*  CREATININE 8.14* 7.16* 8.00*  CALCIUM 8.3* 8.0* 8.7*  PHOS 5.1* 4.6 4.9*   Recent Labs  Lab 04/24/23 0400 04/25/23 0442 04/26/23 0705  WBC 5.2 5.6 6.5  HGB 7.8* 8.0* 7.9*  HCT 22.5* 23.3* 22.7*  MCV 84.9 85.0 85.7  PLT 136* 154 171

## 2023-04-26 NOTE — Progress Notes (Signed)
 PHARMACY - ANTICOAGULATION CONSULT NOTE  Pharmacy Consult for heparin Indication: atrial fibrillation  Allergies  Allergen Reactions   Doxycycline Hives and Rash   Septra [Bactrim] Itching   Penicillins Rash    Allergy to All cillin drugs  Ancef given 9/24 with no obvious reaction   Metformin And Related Diarrhea and Nausea Only   Other Nausea And Vomiting    General Anesthesia - sometimes causes nausea and vomiting * OK to use scopolamine patch     Sulfamethoxazole-Trimethoprim Other (See Comments)   Wellbutrin [Bupropion]     Mood Changes    Patient Measurements: Height: 6\' 1"  (185.4 cm) Weight: 132.4 kg (291 lb 14.2 oz) IBW/kg (Calculated) : 79.9 Heparin Dosing Weight: 112.8 kg  Vital Signs: Temp: 97.3 F (36.3 C) (03/08 0837) Temp Source: Oral (03/08 0837) BP: 148/62 (03/08 0837) Pulse Rate: 50 (03/08 0409)  Labs: Recent Labs    04/24/23 0400 04/24/23 1733 04/25/23 0442 04/26/23 0705  HGB 7.8*  --  8.0* 7.9*  HCT 22.5*  --  23.3* 22.7*  PLT 136*  --  154 171  APTT 54*  --   --   --   HEPARINUNFRC 0.25* 0.28* 0.32 0.31  CREATININE 8.14*  --  7.16* 8.00*  CKTOTAL  --   --  187  --     Estimated Creatinine Clearance: 12.4 mL/min (A) (by C-G formula based on SCr of 8 mg/dL (H)).   Medical History: Past Medical History:  Diagnosis Date   Anemia    Anginal pain (HCC) 04/2013   Cardiac cath showed patent stents with distal LAD, circumflex-OM and RCA disease in small vessels.   Anxiety    Atrial fibrillation (HCC) 12/2021   CAD S/P percutaneous coronary angioplasty 2003, 04/2012   status post PCI to LAD, circumflex-OM 2, RCA   Carotid artery occlusion    Chronic renal insufficiency, stage II (mild)    Chronic venous insufficiency    varicosities, no reflux; dopplers 04/14/12- valvular insufficiency in the R and L GSV   Complication of anesthesia    COPD, mild (HCC)    Depression    situaltional    Diabetes mellitus    Diabetic neuropathy (HCC)  08/24/2019   Dyslipidemia associated with type 2 diabetes mellitus (HCC)    Fall at home, initial encounter 01/02/2023   GERD (gastroesophageal reflux disease)    History of cardioversion 12/2021   at [facility]   HTN (hypertension)    Hypothyroidism    Neuromuscular disorder (HCC)    neuropathy in feet   Neuropathy    notably improved following PCI with improved cardiac function   Obesity    Obesity, Class II, BMI 35.0-39.9, with comorbidity (see actual BMI)    BMI 39; wgt loss efforts in place; seeing Dietician   Open ankle fracture 01/02/2023   Orthostatic hypotension    PONV (postoperative nausea and vomiting)    "[employer] Works"   Pulmonary hypertension (HCC) 04/2012   PA pressure   Sleep apnea    uses nightly   Vitamin D deficiency    per facility notes    Medications:  Scheduled:   allopurinol  50 mg Oral Daily   amiodarone  200 mg Oral BID   atorvastatin  40 mg Oral Daily   carvedilol  3.125 mg Oral BID WC   Chlorhexidine Gluconate Cloth  6 each Topical Q0600   clopidogrel  75 mg Oral Q breakfast   docusate sodium  100 mg Oral BID  DULoxetine  60 mg Oral Daily   ezetimibe  10 mg Oral Daily   gabapentin  300 mg Oral QHS   guaiFENesin  600 mg Oral BID   insulin aspart  0-9 Units Subcutaneous Q6H   insulin glargine  40 Units Subcutaneous Daily   levothyroxine  88 mcg Oral QAC breakfast   polyethylene glycol  17 g Oral Daily   sodium chloride flush  3 mL Intravenous Q12H   sodium chloride flush  3-10 mL Intravenous Q12H   tamsulosin  0.4 mg Oral Daily   Infusions:   heparin 1,450 Units/hr (04/26/23 0122)    Assessment: 68 y/o male with history of afib on apixaban. Eliquis has been held d/t bleeding from CVL site. IV Heparin was started but then stopped 3/2 due to bleeding from the IV line and has remained on hold for HD cath placement. Plans are to resume IV heparin then resume apixaban later.  3/8 AM: Heparin level remains therapeutic at 0.31 on 1450  units/hr. Hgb 7.9 (stable), PLT 171. No issues with infusion and no new bleeding reported.   Goal of Therapy:  Heparin level= 0.3-0.5 units/mL Monitor platelets by anticoagulation protocol: Yes   Plan:  Continue heparin 1450 units/hr Daily heparin level and CBC while on heparin Follow-up transition to PO anticoagulation  Lennie Muckle, PharmD PGY1 Pharmacy Resident 04/26/2023 8:42 AM

## 2023-04-26 NOTE — Progress Notes (Incomplete)
 [facility]                                                                              Kiara Keep, is a 68 y.o. male, DOB - 23-Dec-1955, ZOX:[id number] Admit date - 03/27/2023    Outpatient Primary MD for the patient is [name], MD  LOS - 30  days  Chief Complaint  Patient presents with   Cough       Brief summary   Patient is a 68 year old male with morbid obesity, OSA, DM2, HTN, HLD, CAD/stent, A-fib on Eliquis, carotid artery stenosis, CKD, chronic anemia, anxiety/depression, diabetic neuropathy, GERD, hypothyroidism, COPD, pulmonary hypertension. 2/6, patient presented to the ED with complaint of cough, subjective fever, myalgia, generalized weakness for 5 days. Respiratory virus panel positive for influenza A. Chest x-ray negative for infiltrates. Also noted to have right leg cellulitis.  04/25/2023: Patient seen.  No new changes.  Assessment & Plan   Acute respiratory failure with hypoxia Influenza A -Originally admitted for influenza A on 03-27-2023.  Presented with 5 days of cough, fever, myalgia.  -Respiratory virus panel positive for influenza A.  -Chest x-ray without acute infiltrate.  Improved with symptomatic treatment, was outside 48-hour window for Tamiflu hence was not initiated.  -wean O2 as tolerated 04/25/2023: Continue supportive care.   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 -S/p right ankle hardware removal, I&D and wound VAC placement on 2/10 by Dr. Jena Gauss -S/p repeat debridement of the right ankle by Dr. Lajoyce Corners 04/02/2023.   -ID was consulted, initially on cefepime, subsequently on daptomycin and ertapenem, now antibiotics held due to worsening renal function.     AKI on CKD stage IIIb:  Nephrotic range proteinuria, uremia, Anion gap metabolic acidosis -Baseline creatinine appears to be  1.9-2.2.   -Renal function has continued to progressively worsen -Nephrology following, renal biopsy deferred due to high risk of bleeding and on Plavix for recent stent for PAD  -TDC placed on 3/4, underwent first HD session  -creatinine continues to rise, 8.14 today, plan for HD today 04/25/2023: For possible hemodialysis tomorrow.   Right facial droop -On 3/1 patient noted to have right facial droop, evaluated by neurology, MRI of the brain negative for acute stroke. -Currently on Plavix.  PAD -Seen by vascular surgery underwent aortogram, right leg angiogram, successful stenting of right superficial femoral artery on 04/01/2023.   -Continue Plavix, Zetia  Acute metabolic encephalopathy -Likely multifactorial, uremia, infection, drug-induced from possibility of cefepime induced encephalopathy, chronic worsening memory issues -Started on hemodialysis Continue Cymbalta, decreased Neurontin 04/25/2023: Encephalopathy seems to have resolved.   Type 2 diabetes mellitus / Diabetic neuropathy, POA: A1c 7 on 01/02/2023. PTA meds-Tresiba 60 units daily, Mounjaro  CBG (last 3)  Recent Labs    04/26/23 0643 04/26/23 0749 04/26/23 1138  GLUCAP 123* 127* 129*   -Continue Semglee, SSI   Diabetic neuropathy -Gabapentin decreased due to worsening renal function. (PTA reportedly on gabapentin 1600 mg 3 times daily)   Hypokalemia/hypomagnesemia: Improved   Paroxysmal A-fib -HR controlled, continue Coreg, amiodarone  -CHADS-VASc  of 4 -Eliquis held secondary to bleeding from CVL recently -Heparin IV resumed   Essential hypertension -BP stable, continue low-dose Coreg   Anemia of chronic disease: Baseline hemoglobin between 10.5 11.5.   -H&H stable, borderline  CAD/stent Carotid artery stenosis HLD Continue PTA meds -Plavix, Crestor -Eliquis held   Hypothyroidism Continue Synthroid   Obstructive sleep apnea CPAP at bedtime    Gout Continue allopurinol, colchicine     Orthostatic hypotension -Received brief IV fluids, continue abdominal binder -2D echo showed EF 55 to 60%, basal inferior hypokinesis, G2 DD    Generalized debility -PT had recommended SNF however patient will have to pay out-of-pocket not able to afford.  Acute diarrhea Resolved   Influenza A:  - Presented with 5 days of cough, fever, myalgia.  - Respiratory virus panel positive for influenza A. Chest x-ray without acute infiltrate -Improved with symptomatic treatment, was outside 48-hour window for Tamiflu hence was not initiated.   Moderate obesity, class II Estimated body mass index is 38.51 kg/m as calculated from the following:   Height as of this encounter: 6\' 1"  (1.854 m).   Weight as of this encounter: 132.4 kg.  Code Status: Full code DVT Prophylaxis:  Place and maintain sequential compression device Start: 04/15/23 1549 SCDs Start: 04/02/23 1617   Level of Care: Level of care: Progressive Cardiac Family Communication:  Disposition Plan:      Remains inpatient appropriate:      Procedures:  2D echo [facility] placement 04/22/23, started on HD #1 on 04/22/23  Consultants:   Renal Ortho ID   Antimicrobials:   Anti-infectives (From admission, onward)    Start     Dose/Rate Route Frequency Ordered Stop   04/22/23 1300  vancomycin (VANCOCIN) IVPB 1000 mg/200 mL premix        1,000 mg 200 mL/hr over 60 Minutes Intravenous To Radiology 04/22/23 1110 04/22/23 1849   04/19/23 1000  ertapenem (INVANZ) 500 mg in sodium chloride 0.9 % 50 mL IVPB  Status:  Discontinued        500 mg 100 mL/hr over 30 Minutes Intravenous Daily 04/18/23 0931 04/18/23 1106   04/17/23 1800  ertapenem (INVANZ) 1 g in sodium chloride 0.9 % 100 mL IVPB  Status:  Discontinued        1 g 200 mL/hr over 30 Minutes Intravenous Daily 04/17/23 1354 04/18/23 0931   04/08/23 0000  daptomycin (CUBICIN) IVPB  Status:  Discontinued        700 mg Intravenous Every 24 hours 04/08/23 1147 04/08/23     04/08/23 0000  ceFEPime (MAXIPIME) IVPB  Status:  Discontinued        2 g Intravenous Every 12 hours 04/08/23 1147 04/18/23    04/08/23 0000  daptomycin (CUBICIN) IVPB  Status:  Discontinued        700 mg Intravenous Every 24 hours 04/08/23 1150 04/18/23    04/04/23 1400  DAPTOmycin (CUBICIN) IVPB 700 mg/129mL premix  Status:  Discontinued        700 mg 200 mL/hr over 30 Minutes Intravenous Daily 04/03/23 1435 04/18/23 1106   04/02/23 1715  ceFAZolin (ANCEF) IVPB 2g/100 mL premix  Status:  Discontinued        2 g 200 mL/hr over 30 Minutes Intravenous Every 8 hours 04/02/23 1616 04/02/23 1623   04/02/23 0600  ceFAZolin (ANCEF) IVPB 3g/150 mL premix  Status:  Discontinued        3 g 300 mL/hr over 30 Minutes Intravenous On call to O.R.  04/01/23 1700 04/02/23 1549   03/31/23 1015  ceFAZolin (ANCEF) IVPB 2g/100 mL premix        2 g 200 mL/hr over 30 Minutes Intravenous On call to O.R. 03/31/23 0921 03/31/23 1203   03/29/23 2200  vancomycin (VANCOREADY) IVPB 2000 mg/400 mL  Status:  Discontinued       Placed in "Followed by" Linked Group   2,000 mg 200 mL/hr over 120 Minutes Intravenous Every 48 hours 03/27/23 2257 04/03/23 1435   03/28/23 1045  metroNIDAZOLE (FLAGYL) tablet 500 mg  Status:  Discontinued        500 mg Oral Every 12 hours 03/28/23 0955 04/03/23 0945   03/27/23 2330  vancomycin (VANCOCIN) 2,500 mg in sodium chloride 0.9 % 500 mL IVPB       Placed in "Followed by" Linked Group   2,500 mg 262.5 mL/hr over 120 Minutes Intravenous  Once 03/27/23 2257 03/28/23 0241   03/27/23 2300  metroNIDAZOLE (FLAGYL) IVPB 500 mg  Status:  Discontinued        500 mg 100 mL/hr over 60 Minutes Intravenous Every 12 hours 03/27/23 2247 03/28/23 0955   03/27/23 2300  ceFEPIme (MAXIPIME) 2 g in sodium chloride 0.9 % 100 mL IVPB  Status:  Discontinued        2 g 200 mL/hr over 30 Minutes Intravenous Every 12 hours 03/27/23 2257 04/17/23 1354   03/27/23 2100  ceFAZolin (ANCEF) IVPB 2g/100 mL premix         2 g 200 mL/hr over 30 Minutes Intravenous  Once 03/27/23 2058 03/27/23 2219          Medications  allopurinol  50 mg Oral Daily   amiodarone  200 mg Oral BID   atorvastatin  40 mg Oral Daily   carvedilol  3.125 mg Oral BID WC   Chlorhexidine Gluconate Cloth  6 each Topical Q0600   clopidogrel  75 mg Oral Q breakfast   docusate sodium  100 mg Oral BID   DULoxetine  60 mg Oral Daily   ezetimibe  10 mg Oral Daily   gabapentin  300 mg Oral QHS   guaiFENesin  600 mg Oral BID   insulin aspart  0-9 Units Subcutaneous Q6H   insulin glargine  40 Units Subcutaneous Daily   levothyroxine  88 mcg Oral QAC breakfast   polyethylene glycol  17 g Oral Daily   sodium chloride flush  3 mL Intravenous Q12H   sodium chloride flush  3-10 mL Intravenous Q12H   tamsulosin  0.4 mg Oral Daily      Subjective:   Harold [name] was seen and examined today.  Somnolent but easily arousable, denies any specific complaints.  No chest pain, shortness of breath, fevers or chills, on CPAP Objective:   Vitals:   04/26/23 0900 04/26/23 1000 04/26/23 1100 04/26/23 1129  BP:    126/60  Pulse: (!) 55 (!) 51 (!) 53 (!) 51  Resp:    20  Temp:    (!) 97.4 F (36.3 C)  TempSrc:    Axillary  SpO2: 100% 99%  100%  Weight:      Height:        Intake/Output Summary (Last 24 hours) at 04/26/2023 1439 Last data filed at 04/26/2023 1100 Gross per 24 hour  Intake 1149.41 ml  Output 0 ml  Net 1149.41 ml      Wt Readings from Last 3 Encounters:  04/26/23 132.4 kg  01/28/23 (!) 142.9 kg  01/09/23 (!) 145 kg  Physical Exam General: Patient is obese.  Awake and alert.  Not in any distress.   Cardiovascular: S1 S2   Respiratory: Clear to auscultation. Gastrointestinal: Obese, soft and nontender. Ext: No significant lower extremity edema.  Right lower leg and foot in a splint.   Data Reviewed:  I have personally reviewed following labs    CBC Lab Results  Component Value Date   WBC 6.5  04/26/2023   RBC 2.65 (L) 04/26/2023   HGB 7.9 (L) 04/26/2023   HCT 22.7 (L) 04/26/2023   MCV 85.7 04/26/2023   MCH 29.8 04/26/2023   PLT 171 04/26/2023   MCHC 34.8 04/26/2023   RDW 15.9 (H) 04/26/2023   LYMPHSABS 0.9 04/15/2023   MONOABS 0.6 04/15/2023   EOSABS 0.3 04/15/2023   BASOSABS 0.1 04/15/2023     Last metabolic panel Lab Results  Component Value Date   NA 134 (L) 04/26/2023   K 3.5 04/26/2023   CL 98 04/26/2023   CO2 24 04/26/2023   BUN 63 (H) 04/26/2023   CREATININE 8.00 (H) 04/26/2023   GLUCOSE 137 (H) 04/26/2023   GFRNONAA 7 (L) 04/26/2023   GFRAA 47 (L) 09/28/2019   CALCIUM 8.7 (L) 04/26/2023   PHOS 4.9 (H) 04/26/2023   PROT 5.8 (L) 04/16/2023   ALBUMIN 2.3 (L) 04/26/2023   LABGLOB 2.2 04/16/2023   AGRATIO 1.7 04/16/2023   BILITOT 0.6 04/16/2023   ALKPHOS 73 04/16/2023   AST 50 (H) 04/16/2023   ALT 42 04/16/2023   ANIONGAP 12 04/26/2023    CBG (last 3)  Recent Labs    04/26/23 0643 04/26/23 0749 04/26/23 1138  GLUCAP 123* 127* 129*      Coagulation Profile: No results for input(s): "INR", "PROTIME" in the last 168 hours.   Radiology Studies: I have personally reviewed the imaging studies  No results found.    Time spent 35 minutes.   Barnetta Chapel M.D. [facility] 04/26/2023, 2:39 PM  Available via Epic secure chat 7am-7pm After 7 pm, please refer to night coverage provider listed on amion.

## 2023-04-26 NOTE — Plan of Care (Signed)

## 2023-04-27 DIAGNOSIS — L02415 Cutaneous abscess of right lower limb: Secondary | ICD-10-CM | POA: Diagnosis not present

## 2023-04-27 LAB — CBC
HCT: 22.3 % — ABNORMAL LOW (ref 39.0–52.0)
Hemoglobin: 7.7 g/dL — ABNORMAL LOW (ref 13.0–17.0)
MCH: 29.7 pg (ref 26.0–34.0)
MCHC: 34.5 g/dL (ref 30.0–36.0)
MCV: 86.1 fL (ref 80.0–100.0)
Platelets: 172 10*3/uL (ref 150–400)
RBC: 2.59 MIL/uL — ABNORMAL LOW (ref 4.22–5.81)
RDW: 15.8 % — ABNORMAL HIGH (ref 11.5–15.5)
WBC: 6.5 10*3/uL (ref 4.0–10.5)
nRBC: 0 % (ref 0.0–0.2)

## 2023-04-27 LAB — MAGNESIUM: Magnesium: 2.1 mg/dL (ref 1.7–2.4)

## 2023-04-27 LAB — GLUCOSE, CAPILLARY
Glucose-Capillary: 128 mg/dL — ABNORMAL HIGH (ref 70–99)
Glucose-Capillary: 180 mg/dL — ABNORMAL HIGH (ref 70–99)
Glucose-Capillary: 196 mg/dL — ABNORMAL HIGH (ref 70–99)
Glucose-Capillary: 205 mg/dL — ABNORMAL HIGH (ref 70–99)
Glucose-Capillary: 213 mg/dL — ABNORMAL HIGH (ref 70–99)

## 2023-04-27 LAB — BASIC METABOLIC PANEL
Anion gap: 16 — ABNORMAL HIGH (ref 5–15)
BUN: 69 mg/dL — ABNORMAL HIGH (ref 8–23)
CO2: 19 mmol/L — ABNORMAL LOW (ref 22–32)
Calcium: 8.8 mg/dL — ABNORMAL LOW (ref 8.9–10.3)
Chloride: 96 mmol/L — ABNORMAL LOW (ref 98–111)
Creatinine, Ser: 9.02 mg/dL — ABNORMAL HIGH (ref 0.61–1.24)
GFR, Estimated: 6 mL/min — ABNORMAL LOW (ref >60)
Glucose, Bld: 257 mg/dL — ABNORMAL HIGH (ref 70–99)
Potassium: 3.9 mmol/L (ref 3.5–5.1)
Sodium: 131 mmol/L — ABNORMAL LOW (ref 135–145)

## 2023-04-27 LAB — HEPARIN LEVEL (UNFRACTIONATED)
Heparin Unfractionated: 0.28 [IU]/mL — ABNORMAL LOW (ref 0.30–0.70)
Heparin Unfractionated: 0.34 [IU]/mL (ref 0.30–0.70)

## 2023-04-27 LAB — RENAL FUNCTION PANEL
Albumin: 2.3 g/dL — ABNORMAL LOW (ref 3.5–5.0)
Anion gap: 11 (ref 5–15)
BUN: 63 mg/dL — ABNORMAL HIGH (ref 8–23)
CO2: 21 mmol/L — ABNORMAL LOW (ref 22–32)
Calcium: 8.4 mg/dL — ABNORMAL LOW (ref 8.9–10.3)
Chloride: 100 mmol/L (ref 98–111)
Creatinine, Ser: 8.69 mg/dL — ABNORMAL HIGH (ref 0.61–1.24)
GFR, Estimated: 6 mL/min — ABNORMAL LOW (ref >60)
Glucose, Bld: 193 mg/dL — ABNORMAL HIGH (ref 70–99)
Phosphorus: 5.3 mg/dL — ABNORMAL HIGH (ref 2.5–4.6)
Potassium: 3.6 mmol/L (ref 3.5–5.1)
Sodium: 132 mmol/L — ABNORMAL LOW (ref 135–145)

## 2023-04-27 NOTE — Progress Notes (Signed)
 Dartha Lodge, MD notified of 11 beat run of v-tach

## 2023-04-27 NOTE — Progress Notes (Signed)
 [facility]                                                                              Harold [name], is a 68 y.o. male, DOB - [birth year], ZOX:[id number] Admit date - 03/27/2023    Outpatient Primary MD for the patient is [name], MD  LOS - 31  days  Chief Complaint  Patient presents with   Cough       Brief summary  Patient is a 68 year old male with morbid obesity, OSA, DM2, HTN, HLD, CAD/stent, A-fib on Eliquis, carotid artery stenosis, CKD, chronic anemia, anxiety/depression, diabetic neuropathy, GERD, hypothyroidism, COPD, pulmonary hypertension. 2/6, patient presented to the ED with complaint of cough, subjective fever, myalgia, generalized weakness for 5 days. Respiratory virus panel positive for influenza A. Chest x-ray negative for infiltrates. Also noted to have right leg cellulitis.  04/26/2023: Patient seen.  No new changes. 04/27/2023: Patient seen.  No new changes.  Urine output is improving.  Nephrology input is appreciated.  Infectious disease team is monitoring patient off of antibiotics.  Likely discharge once cleared by the nephrology team. Assessment & Plan   Acute respiratory failure with hypoxia Influenza A -Originally admitted for influenza A on 03-27-2023.  Presented with 5 days of cough, fever, myalgia.  -Respiratory virus panel positive for influenza A.  -Chest x-ray without acute infiltrate.  Improved with symptomatic treatment, was outside 48-hour window for Tamiflu hence was not initiated.  -wean O2 as tolerated 04/27/2023: Continue supportive care.   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 -S/p right ankle hardware removal, I&D and wound VAC placement on 2/10 by Dr. Jena Gauss -S/p repeat debridement of the right ankle by Dr. Lajoyce Corners 04/02/2023.   -ID was consulted, initially on cefepime,  subsequently on daptomycin and ertapenem, now antibiotics held due to worsening renal function.  04/26/2023: Communicated with infectious disease team.  Patient is currently monitored off of antibiotics for now.   AKI on CKD stage IIIb:  Nephrotic range proteinuria, uremia, Anion gap metabolic acidosis -Baseline creatinine appears to be 1.9-2.2.   -Renal function has continued to progressively worsen -Nephrology following, renal biopsy deferred due to high risk of bleeding and on Plavix for recent stent for PAD  -TDC placed on 3/4, underwent first HD session  -creatinine continues to rise, 8.14 today, plan for HD today 04/26/2023: Continue hemodialysis as per nephrology team.   04/27/2023: Nephrology is managing.  Urine output seems to be improving.  Discharge patient once cleared by the nephrology team.  Right facial droop -On 3/1 patient noted to have right facial droop, evaluated by neurology, MRI of the brain negative for acute stroke. -Currently on Plavix.  PAD -Seen by vascular surgery underwent aortogram, right leg angiogram, successful stenting of right superficial femoral artery on 04/01/2023.   -Continue Plavix, Zetia  Acute metabolic encephalopathy -Likely multifactorial, uremia, infection, drug-induced from possibility of cefepime induced encephalopathy, chronic worsening memory issues -Started on hemodialysis Continue Cymbalta, decreased Neurontin 04/26/2023: Encephalopathy seems to have resolved.   Type 2 diabetes mellitus / Diabetic neuropathy,  POA: A1c 7 on 01/02/2023. PTA meds-Tresiba 60 units daily, Mounjaro  CBG (last 3)  Recent Labs    04/26/23 1639 04/27/23 0001 04/27/23 0602  GLUCAP 159* 213* 180*   -Continue Semglee, SSI   Diabetic neuropathy -Gabapentin decreased due to worsening renal function. (PTA reportedly on gabapentin 1600 mg 3 times daily)   Hypokalemia/hypomagnesemia: Improved   Paroxysmal A-fib -HR controlled, continue Coreg, amiodarone   -CHADS-VASc of 4 -Eliquis held secondary to bleeding from CVL recently -Heparin IV resumed   Essential hypertension -BP stable, continue low-dose Coreg   Anemia of chronic disease: Baseline hemoglobin between 10.5 11.5.   -H&H stable, borderline  CAD/stent Carotid artery stenosis HLD Continue PTA meds -Plavix, Crestor -Eliquis held   Hypothyroidism Continue Synthroid   Obstructive sleep apnea CPAP at bedtime    Gout Continue allopurinol, colchicine    Orthostatic hypotension -Received brief IV fluids, continue abdominal binder -2D echo showed EF 55 to 60%, basal inferior hypokinesis, G2 DD    Generalized debility -PT had recommended SNF however patient will have to pay out-of-pocket not able to afford.  Acute diarrhea Resolved   Influenza A:  - Presented with 5 days of cough, fever, myalgia.  - Respiratory virus panel positive for influenza A. Chest x-ray without acute infiltrate -Improved with symptomatic treatment, was outside 48-hour window for Tamiflu hence was not initiated.   Moderate obesity, class II Estimated body mass index is 39.53 kg/m as calculated from the following:   Height as of this encounter: 6\' 1"  (1.854 m).   Weight as of this encounter: 135.9 kg.  Code Status: Full code DVT Prophylaxis:  Place and maintain sequential compression device Start: 04/15/23 1549 SCDs Start: 04/02/23 1617   Level of Care: Level of care: Progressive Cardiac Family Communication:  Disposition Plan:      Remains inpatient appropriate:      Procedures:  2D echo [facility] placement 04/22/23, started on HD #1 on 04/22/23  Consultants:   Renal Ortho ID   Antimicrobials:   Anti-infectives (From admission, onward)    Start     Dose/Rate Route Frequency Ordered Stop   04/22/23 1300  vancomycin (VANCOCIN) IVPB 1000 mg/200 mL premix        1,000 mg 200 mL/hr over 60 Minutes Intravenous To Radiology 04/22/23 1110 04/22/23 1849   04/19/23 1000  ertapenem (INVANZ)  500 mg in sodium chloride 0.9 % 50 mL IVPB  Status:  Discontinued        500 mg 100 mL/hr over 30 Minutes Intravenous Daily 04/18/23 0931 04/18/23 1106   04/17/23 1800  ertapenem (INVANZ) 1 g in sodium chloride 0.9 % 100 mL IVPB  Status:  Discontinued        1 g 200 mL/hr over 30 Minutes Intravenous Daily 04/17/23 1354 04/18/23 0931   04/08/23 0000  daptomycin (CUBICIN) IVPB  Status:  Discontinued        700 mg Intravenous Every 24 hours 04/08/23 1147 04/08/23    04/08/23 0000  ceFEPime (MAXIPIME) IVPB  Status:  Discontinued        2 g Intravenous Every 12 hours 04/08/23 1147 04/18/23    04/08/23 0000  daptomycin (CUBICIN) IVPB  Status:  Discontinued        700 mg Intravenous Every 24 hours 04/08/23 1150 04/18/23    04/04/23 1400  DAPTOmycin (CUBICIN) IVPB 700 mg/196mL premix  Status:  Discontinued        700 mg 200 mL/hr over 30 Minutes Intravenous Daily 04/03/23 1435 04/18/23  1106   04/02/23 1715  ceFAZolin (ANCEF) IVPB 2g/100 mL premix  Status:  Discontinued        2 g 200 mL/hr over 30 Minutes Intravenous Every 8 hours 04/02/23 1616 04/02/23 1623   04/02/23 0600  ceFAZolin (ANCEF) IVPB 3g/150 mL premix  Status:  Discontinued        3 g 300 mL/hr over 30 Minutes Intravenous On call to O.R. 04/01/23 1700 04/02/23 1549   03/31/23 1015  ceFAZolin (ANCEF) IVPB 2g/100 mL premix        2 g 200 mL/hr over 30 Minutes Intravenous On call to O.R. 03/31/23 0921 03/31/23 1203   03/29/23 2200  vancomycin (VANCOREADY) IVPB 2000 mg/400 mL  Status:  Discontinued       Placed in "Followed by" Linked Group   2,000 mg 200 mL/hr over 120 Minutes Intravenous Every 48 hours 03/27/23 2257 04/03/23 1435   03/28/23 1045  metroNIDAZOLE (FLAGYL) tablet 500 mg  Status:  Discontinued        500 mg Oral Every 12 hours 03/28/23 0955 04/03/23 0945   03/27/23 2330  vancomycin (VANCOCIN) 2,500 mg in sodium chloride 0.9 % 500 mL IVPB       Placed in "Followed by" Linked Group   2,500 mg 262.5 mL/hr over 120 Minutes  Intravenous  Once 03/27/23 2257 03/28/23 0241   03/27/23 2300  metroNIDAZOLE (FLAGYL) IVPB 500 mg  Status:  Discontinued        500 mg 100 mL/hr over 60 Minutes Intravenous Every 12 hours 03/27/23 2247 03/28/23 0955   03/27/23 2300  ceFEPIme (MAXIPIME) 2 g in sodium chloride 0.9 % 100 mL IVPB  Status:  Discontinued        2 g 200 mL/hr over 30 Minutes Intravenous Every 12 hours 03/27/23 2257 04/17/23 1354   03/27/23 2100  ceFAZolin (ANCEF) IVPB 2g/100 mL premix        2 g 200 mL/hr over 30 Minutes Intravenous  Once 03/27/23 2058 03/27/23 2219          Medications  allopurinol  50 mg Oral Daily   amiodarone  200 mg Oral BID   atorvastatin  40 mg Oral Daily   carvedilol  3.125 mg Oral BID WC   Chlorhexidine Gluconate Cloth  6 each Topical Q0600   clopidogrel  75 mg Oral Q breakfast   docusate sodium  100 mg Oral BID   DULoxetine  60 mg Oral Daily   ezetimibe  10 mg Oral Daily   gabapentin  300 mg Oral QHS   guaiFENesin  600 mg Oral BID   insulin aspart  0-9 Units Subcutaneous Q6H   insulin glargine  40 Units Subcutaneous Daily   levothyroxine  88 mcg Oral QAC breakfast   polyethylene glycol  17 g Oral Daily   sodium chloride flush  3 mL Intravenous Q12H   sodium chloride flush  3-10 mL Intravenous Q12H   tamsulosin  0.4 mg Oral Daily      Subjective:   Harold [name] was seen and examined today.  Somnolent but easily arousable, denies any specific complaints.  No chest pain, shortness of breath, fevers or chills, on CPAP Objective:   Vitals:   04/27/23 0757 04/27/23 0800 04/27/23 0900 04/27/23 1000  BP:  (!) 140/53    Pulse: (!) 47 (!) 51 (!) 52 (!) 55  Resp: 18 18    Temp: 98 F (36.7 C) 98 F (36.7 C)    TempSrc: Oral Oral  SpO2: 99% 100% 92% 93%  Weight:      Height:        Intake/Output Summary (Last 24 hours) at 04/27/2023 1128 Last data filed at 04/27/2023 0800 Gross per 24 hour  Intake 648.46 ml  Output 1100 ml  Net -451.54 ml      Wt  Readings from Last 3 Encounters:  04/27/23 135.9 kg  01/28/23 (!) 142.9 kg  01/09/23 (!) 145 kg    Physical Exam General: Patient is obese.  Awake and alert.  Not in any distress.   Cardiovascular: S1 S2   Respiratory: Clear to auscultation. Gastrointestinal: Obese, soft and nontender. Ext: No significant lower extremity edema.  Right lower leg and foot in a splint.   Data Reviewed:  I have personally reviewed following labs    CBC Lab Results  Component Value Date   WBC 6.5 04/27/2023   RBC 2.59 (L) 04/27/2023   HGB 7.7 (L) 04/27/2023   HCT 22.3 (L) 04/27/2023   MCV 86.1 04/27/2023   MCH 29.7 04/27/2023   PLT 172 04/27/2023   MCHC 34.5 04/27/2023   RDW 15.8 (H) 04/27/2023   LYMPHSABS 0.9 04/15/2023   MONOABS 0.6 04/15/2023   EOSABS 0.3 04/15/2023   BASOSABS 0.1 04/15/2023     Last metabolic panel Lab Results  Component Value Date   NA 132 (L) 04/27/2023   K 3.6 04/27/2023   CL 100 04/27/2023   CO2 21 (L) 04/27/2023   BUN 63 (H) 04/27/2023   CREATININE 8.69 (H) 04/27/2023   GLUCOSE 193 (H) 04/27/2023   GFRNONAA 6 (L) 04/27/2023   GFRAA 47 (L) 09/28/2019   CALCIUM 8.4 (L) 04/27/2023   PHOS 5.3 (H) 04/27/2023   PROT 5.8 (L) 04/16/2023   ALBUMIN 2.3 (L) 04/27/2023   LABGLOB 2.2 04/16/2023   AGRATIO 1.7 04/16/2023   BILITOT 0.6 04/16/2023   ALKPHOS 73 04/16/2023   AST 50 (H) 04/16/2023   ALT 42 04/16/2023   ANIONGAP 11 04/27/2023    CBG (last 3)  Recent Labs    04/26/23 1639 04/27/23 0001 04/27/23 0602  GLUCAP 159* 213* 180*      Coagulation Profile: No results for input(s): "INR", "PROTIME" in the last 168 hours.   Radiology Studies: I have personally reviewed the imaging studies  No results found.    Time spent 35 minutes.   Barnetta Chapel M.D. [facility] 04/27/2023, 11:28 AM  Available via Epic secure chat 7am-7pm After 7 pm, please refer to night coverage provider listed on amion.

## 2023-04-27 NOTE — Plan of Care (Signed)
  Problem: Education: Goal: Knowledge of General Education information will improve Description: Including pain rating scale, medication(s)/side effects and non-pharmacologic comfort measures Outcome: Progressing   Problem: Clinical Measurements: Goal: Ability to maintain clinical measurements within normal limits will improve Outcome: Progressing Goal: Will remain free from infection Outcome: Progressing Goal: Diagnostic test results will improve Outcome: Progressing Goal: Cardiovascular complication will be avoided Outcome: Progressing   Problem: Activity: Goal: Risk for activity intolerance will decrease Outcome: Progressing   Problem: Elimination: Goal: Will not experience complications related to bowel motility Outcome: Progressing   Problem: Pain Managment: Goal: General experience of comfort will improve and/or be controlled Outcome: Progressing

## 2023-04-27 NOTE — Progress Notes (Signed)
 Admit: 03/27/2023 LOS: 31  76M with AKI on CKD3 likely from ATN  Subjective:  Poor sleep and pain overnight UOP 0.3 L yesterday, already 0.8 L recorded for today  Creatinine 8.7 from 8.0, BUN and K stable. Has outpt CLIP [facility] at [facility], can start 3/11  03/08 0701 - 03/09 0700 In: 963.3 [P.O.:600; I.V.:363.3] Out: 300 [Urine:300]  Filed Weights   04/25/23 0452 04/26/23 0409 04/27/23 0415  Weight: (!) 136.1 kg 132.4 kg 135.9 kg    Scheduled Meds:  allopurinol  50 mg Oral Daily   amiodarone  200 mg Oral BID   atorvastatin  40 mg Oral Daily   carvedilol  3.125 mg Oral BID WC   Chlorhexidine Gluconate Cloth  6 each Topical Q0600   clopidogrel  75 mg Oral Q breakfast   docusate sodium  100 mg Oral BID   DULoxetine  60 mg Oral Daily   ezetimibe  10 mg Oral Daily   gabapentin  300 mg Oral QHS   guaiFENesin  600 mg Oral BID   insulin aspart  0-9 Units Subcutaneous Q6H   insulin glargine  40 Units Subcutaneous Daily   levothyroxine  88 mcg Oral QAC breakfast   polyethylene glycol  17 g Oral Daily   sodium chloride flush  3 mL Intravenous Q12H   sodium chloride flush  3-10 mL Intravenous Q12H   tamsulosin  0.4 mg Oral Daily   Continuous Infusions:  heparin 1,550 Units/hr (04/27/23 0800)    PRN Meds:.acetaminophen **OR** acetaminophen, calcium carbonate, hydrALAZINE, melatonin, methocarbamol **OR** methocarbamol (ROBAXIN) injection, metoCLOPramide **OR** metoCLOPramide (REGLAN) injection, naLOXone (NARCAN)  injection, ondansetron (ZOFRAN) IV, sodium chloride flush, sodium chloride flush  Current Labs: reviewed   Physical Exam:  Blood pressure (!) 140/53, pulse (!) 55, temperature 98 F (36.7 C), temperature source Oral, resp. rate 18, height 6\' 1"  (1.854 m), weight 135.9 kg, SpO2 93%. NAD, awake, alert, conversant Regular, normal S1 and S2 Clear bilaterally Lower extremity edema is trace, ankle hardware present [facility] c/d/i  A Dialysis dependent Anuric AKI on CKD 3; ATN and AIN  both possible CLIP as AKI to [place] start 3/11 if needed Hypertension with tendency towards orthostasis on low-dose carvedilol and tamsulosin Nephrotic range proteinuria with negative serological evaluation but decision against biopsy at the current time per previous notes Right lower extremity infection with involvement of orthopedic hardware status post removal and antibiotics discontinued 2/28 with concern for AIN Chronic anemia, hemoglobin in the 7s Mild hyponatremia, dialysis will address, continue to limit free water intake Increased anion gap metabolic acidosis  P Will reassess labs and urine output tomorrow morning to determine whether or not to provide ongoing dialysis or if he has recovered. Currently most likely will need ongoing dialysis Medication Issues; Preferred narcotic agents for pain control are hydromorphone, fentanyl, and methadone. Morphine should not be used.  Baclofen should be avoided Avoid oral sodium phosphate and magnesium citrate based laxatives / bowel preps    Sabra Heck MD 04/27/2023, 10:52 AM  Recent Labs  Lab 04/25/23 0442 04/26/23 0705 04/27/23 0530  NA 130* 134* 132*  K 3.5 3.5 3.6  CL 99 98 100  CO2 22 24 21*  GLUCOSE 196* 137* 193*  BUN 64* 63* 63*  CREATININE 7.16* 8.00* 8.69*  CALCIUM 8.0* 8.7* 8.4*  PHOS 4.6 4.9* 5.3*   Recent Labs  Lab 04/25/23 0442 04/26/23 0705 04/27/23 0530  WBC 5.6 6.5 6.5  HGB 8.0* 7.9* 7.7*  HCT 23.3* 22.7* 22.3*  MCV 85.0  85.7 86.1  PLT 154 171 172

## 2023-04-27 NOTE — Progress Notes (Signed)
 PHARMACY - ANTICOAGULATION CONSULT NOTE  Pharmacy Consult for heparin Indication: atrial fibrillation  Allergies  Allergen Reactions   Doxycycline Hives and Rash   Septra [Bactrim] Itching   Penicillins Rash    Allergy to All cillin drugs  Ancef given 9/24 with no obvious reaction   Metformin And Related Diarrhea and Nausea Only   Other Nausea And Vomiting    General Anesthesia - sometimes causes nausea and vomiting * OK to use scopolamine patch     Sulfamethoxazole-Trimethoprim Other (See Comments)   Wellbutrin [Bupropion]     Mood Changes    Patient Measurements: Height: 6\' 1"  (185.4 cm) Weight: 135.9 kg (299 lb 9.7 oz) IBW/kg (Calculated) : 79.9 Heparin Dosing Weight: 112.8 kg  Vital Signs: Temp: 98 F (36.7 C) (03/09 0415) Temp Source: Axillary (03/09 0415) BP: 155/56 (03/08 1935) Pulse Rate: 50 (03/09 0415)  Labs: Recent Labs    04/25/23 0442 04/26/23 0705 04/27/23 0530  HGB 8.0* 7.9* 7.7*  HCT 23.3* 22.7* 22.3*  PLT 154 171 172  HEPARINUNFRC 0.32 0.31 0.28*  CREATININE 7.16* 8.00* 8.69*  CKTOTAL 187  --   --     Estimated Creatinine Clearance: 11.6 mL/min (A) (by C-G formula based on SCr of 8.69 mg/dL (H)).   Medical History: Past Medical History:  Diagnosis Date   Anemia    Anginal pain (HCC) 04/2013   Cardiac cath showed patent stents with distal LAD, circumflex-OM and RCA disease in small vessels.   Anxiety    Atrial fibrillation (HCC) 12/2021   CAD S/P percutaneous coronary angioplasty 2003, 04/2012   status post PCI to LAD, circumflex-OM 2, RCA   Carotid artery occlusion    Chronic renal insufficiency, stage II (mild)    Chronic venous insufficiency    varicosities, no reflux; dopplers 04/14/12- valvular insufficiency in the R and L GSV   Complication of anesthesia    COPD, mild (HCC)    Depression    situaltional    Diabetes mellitus    Diabetic neuropathy (HCC) 08/24/2019   Dyslipidemia associated with type 2 diabetes mellitus (HCC)     Fall at home, initial encounter 01/02/2023   GERD (gastroesophageal reflux disease)    History of cardioversion 12/2021   at [facility]   HTN (hypertension)    Hypothyroidism    Neuromuscular disorder (HCC)    neuropathy in feet   Neuropathy    notably improved following PCI with improved cardiac function   Obesity    Obesity, Class II, BMI 35.0-39.9, with comorbidity (see actual BMI)    BMI 39; wgt loss efforts in place; seeing Dietician   Open ankle fracture 01/02/2023   Orthostatic hypotension    PONV (postoperative nausea and vomiting)    "[employer]"   Pulmonary hypertension (HCC) 04/2012   PA pressure   Sleep apnea    uses nightly   Vitamin D deficiency    per facility notes    Medications:  Scheduled:   allopurinol  50 mg Oral Daily   amiodarone  200 mg Oral BID   atorvastatin  40 mg Oral Daily   carvedilol  3.125 mg Oral BID WC   Chlorhexidine Gluconate Cloth  6 each Topical Q0600   clopidogrel  75 mg Oral Q breakfast   docusate sodium  100 mg Oral BID   DULoxetine  60 mg Oral Daily   ezetimibe  10 mg Oral Daily   gabapentin  300 mg Oral QHS   guaiFENesin  600 mg Oral  BID   insulin aspart  0-9 Units Subcutaneous Q6H   insulin glargine  40 Units Subcutaneous Daily   levothyroxine  88 mcg Oral QAC breakfast   polyethylene glycol  17 g Oral Daily   sodium chloride flush  3 mL Intravenous Q12H   sodium chloride flush  3-10 mL Intravenous Q12H   tamsulosin  0.4 mg Oral Daily   Infusions:   heparin 1,450 Units/hr (04/27/23 0154)    Assessment: 68 y/o male with history of afib on apixaban. Eliquis has been held d/t bleeding from CVL site. IV Heparin was started but then stopped 3/2 due to bleeding from the IV line and has remained on hold for HD cath placement. Plans are to resume IV heparin then resume apixaban later.  3/8 AM: Heparin level subtherapeutic at 0.28 on 1450 units/hr. Hgb 7.7 (stable), PLT 172. No issues with infusion and no new bleeding  reported.   Goal of Therapy:  Heparin level 0.3-0.5 units/mL Monitor platelets by anticoagulation protocol: Yes   Plan:  Increase heparin infusion to 1550 units/hr Obtain heparin level in 8 hours Daily heparin level and CBC while on heparin Follow-up transition to PO anticoagulation  Lennie Muckle, PharmD PGY1 Pharmacy Resident 04/27/2023 7:27 AM

## 2023-04-27 NOTE — Progress Notes (Signed)
 PHARMACY - ANTICOAGULATION CONSULT NOTE  Pharmacy Consult for heparin Indication: atrial fibrillation  Allergies  Allergen Reactions   Doxycycline Hives and Rash   Septra [Bactrim] Itching   Penicillins Rash    Allergy to All cillin drugs  Ancef given 9/24 with no obvious reaction   Metformin And Related Diarrhea and Nausea Only   Other Nausea And Vomiting    General Anesthesia - sometimes causes nausea and vomiting * OK to use scopolamine patch     Sulfamethoxazole-Trimethoprim Other (See Comments)   Wellbutrin [Bupropion]     Mood Changes    Patient Measurements: Height: 6\' 1"  (185.4 cm) Weight: 135.9 kg (299 lb 9.7 oz) IBW/kg (Calculated) : 79.9 Heparin Dosing Weight: 112.8 kg  Vital Signs: Temp: 97.3 F (36.3 C) (03/09 1521) Temp Source: Axillary (03/09 1521) BP: 144/61 (03/09 1525) Pulse Rate: 52 (03/09 1600)  Labs: Recent Labs    04/25/23 0442 04/26/23 0705 04/27/23 0530 04/27/23 1534  HGB 8.0* 7.9* 7.7*  --   HCT 23.3* 22.7* 22.3*  --   PLT 154 171 172  --   HEPARINUNFRC 0.32 0.31 0.28* 0.34  CREATININE 7.16* 8.00* 8.69*  --   CKTOTAL 187  --   --   --     Estimated Creatinine Clearance: 11.6 mL/min (A) (by C-G formula based on SCr of 8.69 mg/dL (H)).   Medical History: Past Medical History:  Diagnosis Date   Anemia    Anginal pain (HCC) 04/2013   Cardiac cath showed patent stents with distal LAD, circumflex-OM and RCA disease in small vessels.   Anxiety    Atrial fibrillation (HCC) 12/2021   CAD S/P percutaneous coronary angioplasty 2003, 04/2012   status post PCI to LAD, circumflex-OM 2, RCA   Carotid artery occlusion    Chronic renal insufficiency, stage II (mild)    Chronic venous insufficiency    varicosities, no reflux; dopplers 04/14/12- valvular insufficiency in the R and L GSV   Complication of anesthesia    COPD, mild (HCC)    Depression    situaltional    Diabetes mellitus    Diabetic neuropathy (HCC) 08/24/2019   Dyslipidemia  associated with type 2 diabetes mellitus (HCC)    Fall at home, initial encounter 01/02/2023   GERD (gastroesophageal reflux disease)    History of cardioversion 12/2021   at [facility]   HTN (hypertension)    Hypothyroidism    Neuromuscular disorder (HCC)    neuropathy in feet   Neuropathy    notably improved following PCI with improved cardiac function   Obesity    Obesity, Class II, BMI 35.0-39.9, with comorbidity (see actual BMI)    BMI 39; wgt loss efforts in place; seeing Dietician   Open ankle fracture 01/02/2023   Orthostatic hypotension    PONV (postoperative nausea and vomiting)    "[employer] Works"   Pulmonary hypertension (HCC) 04/2012   PA pressure   Sleep apnea    uses nightly   Vitamin D deficiency    per facility notes    Medications:  Scheduled:   allopurinol  50 mg Oral Daily   amiodarone  200 mg Oral BID   atorvastatin  40 mg Oral Daily   carvedilol  3.125 mg Oral BID WC   Chlorhexidine Gluconate Cloth  6 each Topical Q0600   clopidogrel  75 mg Oral Q breakfast   docusate sodium  100 mg Oral BID   DULoxetine  60 mg Oral Daily   ezetimibe  10  mg Oral Daily   gabapentin  300 mg Oral QHS   guaiFENesin  600 mg Oral BID   insulin aspart  0-9 Units Subcutaneous Q6H   insulin glargine  40 Units Subcutaneous Daily   levothyroxine  88 mcg Oral QAC breakfast   polyethylene glycol  17 g Oral Daily   sodium chloride flush  3 mL Intravenous Q12H   sodium chloride flush  3-10 mL Intravenous Q12H   tamsulosin  0.4 mg Oral Daily   Infusions:   heparin 1,550 Units/hr (04/27/23 1442)    Assessment: 68 y/o male with history of afib on apixaban. Eliquis has been held d/t bleeding from CVL site. IV Heparin was started but then stopped 3/2 due to bleeding from the IV line and has remained on hold for HD cath placement. IV heparin was restarted 3/5 with plans to resume apixaban later.  Heparin level therapeutic at 0.34 on 1550 units/hr. Hgb 7.7 (stable), PLT 172.  No issues with infusion and no new bleeding reported.   Goal of Therapy:  Heparin level 0.3-0.5 units/mL Monitor platelets by anticoagulation protocol: Yes   Plan:  Continue heparin infusion to 1550 units/hr Daily heparin level and CBC while on heparin Follow-up transition to PO anticoagulation   [employer], Pharm.D., BCPS Clinical Pharmacist  **Pharmacist phone directory can be found on [email or web address] listed under [facility].  04/27/2023 4:59 PM

## 2023-04-28 DIAGNOSIS — L02415 Cutaneous abscess of right lower limb: Secondary | ICD-10-CM | POA: Diagnosis not present

## 2023-04-28 LAB — GLUCOSE, CAPILLARY
Glucose-Capillary: 146 mg/dL — ABNORMAL HIGH (ref 70–99)
Glucose-Capillary: 179 mg/dL — ABNORMAL HIGH (ref 70–99)
Glucose-Capillary: 246 mg/dL — ABNORMAL HIGH (ref 70–99)

## 2023-04-28 LAB — CBC
HCT: 23.3 % — ABNORMAL LOW (ref 39.0–52.0)
Hemoglobin: 7.8 g/dL — ABNORMAL LOW (ref 13.0–17.0)
MCH: 29 pg (ref 26.0–34.0)
MCHC: 33.5 g/dL (ref 30.0–36.0)
MCV: 86.6 fL (ref 80.0–100.0)
Platelets: 181 10*3/uL (ref 150–400)
RBC: 2.69 MIL/uL — ABNORMAL LOW (ref 4.22–5.81)
RDW: 15.8 % — ABNORMAL HIGH (ref 11.5–15.5)
WBC: 6.2 10*3/uL (ref 4.0–10.5)
nRBC: 0 % (ref 0.0–0.2)

## 2023-04-28 LAB — RENAL FUNCTION PANEL
Albumin: 2.4 g/dL — ABNORMAL LOW (ref 3.5–5.0)
Anion gap: 13 (ref 5–15)
BUN: 70 mg/dL — ABNORMAL HIGH (ref 8–23)
CO2: 21 mmol/L — ABNORMAL LOW (ref 22–32)
Calcium: 8.5 mg/dL — ABNORMAL LOW (ref 8.9–10.3)
Chloride: 100 mmol/L (ref 98–111)
Creatinine, Ser: 9.06 mg/dL — ABNORMAL HIGH (ref 0.61–1.24)
GFR, Estimated: 6 mL/min — ABNORMAL LOW (ref >60)
Glucose, Bld: 177 mg/dL — ABNORMAL HIGH (ref 70–99)
Phosphorus: 5.6 mg/dL — ABNORMAL HIGH (ref 2.5–4.6)
Potassium: 3.8 mmol/L (ref 3.5–5.1)
Sodium: 134 mmol/L — ABNORMAL LOW (ref 135–145)

## 2023-04-28 LAB — HEPARIN LEVEL (UNFRACTIONATED): Heparin Unfractionated: 0.31 [IU]/mL (ref 0.30–0.70)

## 2023-04-28 MED ORDER — FUROSEMIDE 10 MG/ML IJ SOLN
80.0000 mg | Freq: Two times a day (BID) | INTRAMUSCULAR | Status: AC
  Administered 2023-04-28 – 2023-04-29 (×3): 80 mg via INTRAVENOUS
  Filled 2023-04-28 (×3): qty 8

## 2023-04-28 MED ORDER — APIXABAN 5 MG PO TABS
5.0000 mg | ORAL_TABLET | Freq: Two times a day (BID) | ORAL | Status: DC
  Administered 2023-04-28 – 2023-05-10 (×24): 5 mg via ORAL
  Filled 2023-04-28 (×24): qty 1

## 2023-04-28 MED ORDER — SODIUM CHLORIDE 0.9 % IV SOLN: INTRAVENOUS | Status: DC

## 2023-04-28 NOTE — Plan of Care (Signed)
  Problem: Education: Goal: Knowledge of General Education information will improve Description: Including pain rating scale, medication(s)/side effects and non-pharmacologic comfort measures Outcome: Progressing   Problem: Clinical Measurements: Goal: Diagnostic test results will improve Outcome: Progressing   Problem: Activity: Goal: Risk for activity intolerance will decrease Outcome: Progressing   Problem: Pain Managment: Goal: General experience of comfort will improve and/or be controlled Outcome: Progressing   Problem: Safety: Goal: Ability to remain free from injury will improve Outcome: Progressing   Problem: Skin Integrity: Goal: Risk for impaired skin integrity will decrease Outcome: Progressing   Problem: Nutritional: Goal: Maintenance of adequate nutrition will improve Outcome: Progressing   Problem: Health Behavior/Discharge Planning: Goal: Ability to manage health-related needs will improve Outcome: Not Progressing

## 2023-04-28 NOTE — Progress Notes (Signed)
 [facility]                                                                              Harold [name], is a 68 y.o. male, DOB - 21-Feb-1955, VHQ:[id number] Admit date - 03/27/2023    Outpatient Primary MD for the patient is [name], MD  LOS - 32  days  Chief Complaint  Patient presents with   Cough       Brief summary  Patient is a 68 year old male with morbid obesity, OSA, DM2, HTN, HLD, CAD/stent, A-fib on Eliquis, carotid artery stenosis, CKD, chronic anemia, anxiety/depression, diabetic neuropathy, GERD, hypothyroidism, COPD, pulmonary hypertension. 2/6, patient presented to the ED with complaint of cough, subjective fever, myalgia, generalized weakness for 5 days. Respiratory virus panel positive for influenza A. Chest x-ray negative for infiltrates. Also noted to have right leg cellulitis.  04/28/2023: Patient seen.  Improved urine output.  Stable serum creatinine.  Nephrology input is appreciated.  Patient will be discharged once cleared for discharge by the nephrology team. Assessment & Plan   Acute respiratory failure with hypoxia Influenza A -Originally admitted for influenza A on 03-27-2023.  Presented with 5 days of cough, fever, myalgia.  -Respiratory virus panel positive for influenza A.  -Chest x-ray without acute infiltrate.  Improved with symptomatic treatment, was outside 48-hour window for Tamiflu hence was not initiated.  -wean O2 as tolerated 04/28/2023: Continue supportive care.   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 -S/p right ankle hardware removal, I&D and wound VAC placement on 2/10 by Dr. Jena Gauss -S/p repeat debridement of the right ankle by Dr. Lajoyce Corners 04/02/2023.   -ID was consulted, initially on cefepime, subsequently on daptomycin and ertapenem, now antibiotics held due to worsening renal  function.  04/26/2023: Communicated with infectious disease team.  Patient is currently monitored off of antibiotics for now.   AKI on CKD stage IIIb:  Nephrotic range proteinuria, uremia, Anion gap metabolic acidosis -Baseline creatinine appears to be 1.9-2.2.   -Renal function has continued to progressively worsen -Nephrology following, renal biopsy deferred due to high risk of bleeding and on Plavix for recent stent for PAD  -TDC placed on 3/4, underwent first HD session  -creatinine continues to rise, 8.14 today, plan for HD today 04/26/2023: Continue hemodialysis as per nephrology team.   04/27/2023: Nephrology is managing.  Urine output seems to be improving.  Discharge patient once cleared by the nephrology team. 04/28/2023: Improved urine output.  Right facial droop -On 3/1 patient noted to have right facial droop, evaluated by neurology, MRI of the brain negative for acute stroke. -Currently on Plavix.  PAD -Seen by vascular surgery underwent aortogram, right leg angiogram, successful stenting of right superficial femoral artery on 04/01/2023.   -Continue Plavix, Zetia  Acute metabolic encephalopathy -Likely multifactorial, uremia, infection, drug-induced from possibility of cefepime induced encephalopathy, chronic worsening memory issues -Started on hemodialysis Continue Cymbalta, decreased Neurontin 04/26/2023: Encephalopathy seems to have resolved.   Type 2 diabetes mellitus / Diabetic neuropathy, POA: A1c 7 on 01/02/2023. PTA meds-Tresiba 60 units daily,  Mounjaro  CBG (last 3)  Recent Labs    04/28/23 0633 04/28/23 1036 04/28/23 1725  GLUCAP 146* 179* 246*   -Continue Semglee, SSI   Diabetic neuropathy -Gabapentin decreased due to worsening renal function. (PTA reportedly on gabapentin 1600 mg 3 times daily)   Hypokalemia/hypomagnesemia: Improved   Paroxysmal A-fib -HR controlled, continue Coreg, amiodarone  -CHADS-VASc of 4 -Eliquis held secondary to bleeding  from CVL recently -Heparin IV resumed   Essential hypertension -BP stable, continue low-dose Coreg   Anemia of chronic disease: Baseline hemoglobin between 10.5 11.5.   -H&H stable, borderline  CAD/stent Carotid artery stenosis HLD Continue PTA meds -Plavix, Crestor -Eliquis held   Hypothyroidism Continue Synthroid   Obstructive sleep apnea CPAP at bedtime    Gout Continue allopurinol, colchicine    Orthostatic hypotension -Received brief IV fluids, continue abdominal binder -2D echo showed EF 55 to 60%, basal inferior hypokinesis, G2 DD    Generalized debility -PT had recommended SNF however patient will have to pay out-of-pocket not able to afford.  Acute diarrhea Resolved   Influenza A:  - Presented with 5 days of cough, fever, myalgia.  - Respiratory virus panel positive for influenza A. Chest x-ray without acute infiltrate -Improved with symptomatic treatment, was outside 48-hour window for Tamiflu hence was not initiated.   Moderate obesity, class II Estimated body mass index is 40.17 kg/m as calculated from the following:   Height as of this encounter: 6\' 1"  (1.854 m).   Weight as of this encounter: 138.1 kg.  Code Status: Full code DVT Prophylaxis:  Place and maintain sequential compression device Start: 04/15/23 1549 SCDs Start: 04/02/23 1617 apixaban (ELIQUIS) tablet 5 mg   Level of Care: Level of care: Progressive Cardiac Family Communication:  Disposition Plan:      Remains inpatient appropriate:      Procedures:  2D echo [facility] placement 04/22/23, started on HD #1 on 04/22/23  Consultants:   Renal Ortho ID   Antimicrobials:   Anti-infectives (From admission, onward)    Start     Dose/Rate Route Frequency Ordered Stop   04/22/23 1300  vancomycin (VANCOCIN) IVPB 1000 mg/200 mL premix        1,000 mg 200 mL/hr over 60 Minutes Intravenous To Radiology 04/22/23 1110 04/22/23 1849   04/19/23 1000  ertapenem (INVANZ) 500 mg in sodium chloride  0.9 % 50 mL IVPB  Status:  Discontinued        500 mg 100 mL/hr over 30 Minutes Intravenous Daily 04/18/23 0931 04/18/23 1106   04/17/23 1800  ertapenem (INVANZ) 1 g in sodium chloride 0.9 % 100 mL IVPB  Status:  Discontinued        1 g 200 mL/hr over 30 Minutes Intravenous Daily 04/17/23 1354 04/18/23 0931   04/08/23 0000  daptomycin (CUBICIN) IVPB  Status:  Discontinued        700 mg Intravenous Every 24 hours 04/08/23 1147 04/08/23    04/08/23 0000  ceFEPime (MAXIPIME) IVPB  Status:  Discontinued        2 g Intravenous Every 12 hours 04/08/23 1147 04/18/23    04/08/23 0000  daptomycin (CUBICIN) IVPB  Status:  Discontinued        700 mg Intravenous Every 24 hours 04/08/23 1150 04/18/23    04/04/23 1400  DAPTOmycin (CUBICIN) IVPB 700 mg/127mL premix  Status:  Discontinued        700 mg 200 mL/hr over 30 Minutes Intravenous Daily 04/03/23 1435 04/18/23 1106   04/02/23 1715  ceFAZolin (ANCEF) IVPB 2g/100 mL premix  Status:  Discontinued        2 g 200 mL/hr over 30 Minutes Intravenous Every 8 hours 04/02/23 1616 04/02/23 1623   04/02/23 0600  ceFAZolin (ANCEF) IVPB 3g/150 mL premix  Status:  Discontinued        3 g 300 mL/hr over 30 Minutes Intravenous On call to O.R. 04/01/23 1700 04/02/23 1549   03/31/23 1015  ceFAZolin (ANCEF) IVPB 2g/100 mL premix        2 g 200 mL/hr over 30 Minutes Intravenous On call to O.R. 03/31/23 0921 03/31/23 1203   03/29/23 2200  vancomycin (VANCOREADY) IVPB 2000 mg/400 mL  Status:  Discontinued       Placed in "Followed by" Linked Group   2,000 mg 200 mL/hr over 120 Minutes Intravenous Every 48 hours 03/27/23 2257 04/03/23 1435   03/28/23 1045  metroNIDAZOLE (FLAGYL) tablet 500 mg  Status:  Discontinued        500 mg Oral Every 12 hours 03/28/23 0955 04/03/23 0945   03/27/23 2330  vancomycin (VANCOCIN) 2,500 mg in sodium chloride 0.9 % 500 mL IVPB       Placed in "Followed by" Linked Group   2,500 mg 262.5 mL/hr over 120 Minutes Intravenous  Once  03/27/23 2257 03/28/23 0241   03/27/23 2300  metroNIDAZOLE (FLAGYL) IVPB 500 mg  Status:  Discontinued        500 mg 100 mL/hr over 60 Minutes Intravenous Every 12 hours 03/27/23 2247 03/28/23 0955   03/27/23 2300  ceFEPIme (MAXIPIME) 2 g in sodium chloride 0.9 % 100 mL IVPB  Status:  Discontinued        2 g 200 mL/hr over 30 Minutes Intravenous Every 12 hours 03/27/23 2257 04/17/23 1354   03/27/23 2100  ceFAZolin (ANCEF) IVPB 2g/100 mL premix        2 g 200 mL/hr over 30 Minutes Intravenous  Once 03/27/23 2058 03/27/23 2219          Medications  allopurinol  50 mg Oral Daily   amiodarone  200 mg Oral BID   apixaban  5 mg Oral BID   atorvastatin  40 mg Oral Daily   carvedilol  3.125 mg Oral BID WC   Chlorhexidine Gluconate Cloth  6 each Topical Q0600   clopidogrel  75 mg Oral Q breakfast   docusate sodium  100 mg Oral BID   DULoxetine  60 mg Oral Daily   ezetimibe  10 mg Oral Daily   furosemide  80 mg Intravenous BID   gabapentin  300 mg Oral QHS   guaiFENesin  600 mg Oral BID   insulin aspart  0-9 Units Subcutaneous Q6H   insulin glargine  40 Units Subcutaneous Daily   levothyroxine  88 mcg Oral QAC breakfast   polyethylene glycol  17 g Oral Daily   sodium chloride flush  3 mL Intravenous Q12H   sodium chloride flush  3-10 mL Intravenous Q12H   tamsulosin  0.4 mg Oral Daily      Subjective:   Harold [name] was seen and examined today.  Somnolent but easily arousable, denies any specific complaints.  No chest pain, shortness of breath, fevers or chills, on CPAP Objective:   Vitals:   04/28/23 1032 04/28/23 1221 04/28/23 1741 04/28/23 1942  BP: (!) 153/59 126/79 (!) 150/58 (!) 148/52  Pulse: (!) 53  (!) 55 (!) 54  Resp:   18 18  Temp: (!) 97 F (36.1 C)  (!)  97.5 F (36.4 C) 98.3 F (36.8 C)  TempSrc: Axillary   Axillary  SpO2: 99% 99% 95% 97%  Weight:      Height:        Intake/Output Summary (Last 24 hours) at 04/28/2023 1944 Last data filed at  04/27/2023 2346 Gross per 24 hour  Intake 138.53 ml  Output --  Net 138.53 ml      Wt Readings from Last 3 Encounters:  04/28/23 (!) 138.1 kg  01/28/23 (!) 142.9 kg  01/09/23 (!) 145 kg    Physical Exam General: Patient is obese.  Awake and alert.  Not in any distress.   Cardiovascular: S1 S2   Respiratory: Clear to auscultation. Gastrointestinal: Obese, soft and nontender. Ext: No significant lower extremity edema.  Right lower leg and foot in a splint.   Data Reviewed:  I have personally reviewed following labs    CBC Lab Results  Component Value Date   WBC 6.2 04/28/2023   RBC 2.69 (L) 04/28/2023   HGB 7.8 (L) 04/28/2023   HCT 23.3 (L) 04/28/2023   MCV 86.6 04/28/2023   MCH 29.0 04/28/2023   PLT 181 04/28/2023   MCHC 33.5 04/28/2023   RDW 15.8 (H) 04/28/2023   LYMPHSABS 0.9 04/15/2023   MONOABS 0.6 04/15/2023   EOSABS 0.3 04/15/2023   BASOSABS 0.1 04/15/2023     Last metabolic panel Lab Results  Component Value Date   NA 134 (L) 04/28/2023   K 3.8 04/28/2023   CL 100 04/28/2023   CO2 21 (L) 04/28/2023   BUN 70 (H) 04/28/2023   CREATININE 9.06 (H) 04/28/2023   GLUCOSE 177 (H) 04/28/2023   GFRNONAA 6 (L) 04/28/2023   GFRAA 47 (L) 09/28/2019   CALCIUM 8.5 (L) 04/28/2023   PHOS 5.6 (H) 04/28/2023   PROT 5.8 (L) 04/16/2023   ALBUMIN 2.4 (L) 04/28/2023   LABGLOB 2.2 04/16/2023   AGRATIO 1.7 04/16/2023   BILITOT 0.6 04/16/2023   ALKPHOS 73 04/16/2023   AST 50 (H) 04/16/2023   ALT 42 04/16/2023   ANIONGAP 13 04/28/2023    CBG (last 3)  Recent Labs    04/28/23 0633 04/28/23 1036 04/28/23 1725  GLUCAP 146* 179* 246*      Coagulation Profile: No results for input(s): "INR", "PROTIME" in the last 168 hours.   Radiology Studies: I have personally reviewed the imaging studies  No results found.    Time spent 35 minutes.   Barnetta Chapel M.D. [facility] 04/28/2023, 7:44 PM  Available via Epic secure chat 7am-7pm After 7 pm,  please refer to night coverage provider listed on amion.

## 2023-04-28 NOTE — Progress Notes (Signed)
 [facility] NEPHROLOGY PROGRESS NOTE  Assessment/ Plan: Pt is a 68 y.o. yo male    # Dialysis dependent AKI on CKD 3; ATN and AIN both possible. -CLIP as AKI to [place] start 3/11 if needed. -Noted creatinine level is stable around 9 but he has fluid overload.  Urine output is picking up.  I will go ahead and order IV diuretics today.  If no improvement in renal function by tomorrow then he will need dialysis.  # Hypertension with tendency towards orthostasis on low-dose carvedilol and tamsulosin.  # Nephrotic range proteinuria with negative serological evaluation but decision against biopsy at the current time per previous notes.  # Right lower extremity infection with involvement of orthopedic hardware status post removal and antibiotics discontinued 2/28 with concern for AIN.  #Chronic anemia, hemoglobin in the 7s.  #Mild hyponatremia, continue to limit free water intake, trying IV Lasix today.  # Increased anion gap metabolic acidosis, diuretics would help with volume contraction.  Subjective: Seen and examined at bedside.  Denies nausea, vomiting, chest pain.  Urine output is recorded around 1.2 L in 24 hours. Objective Vital signs in last 24 hours: Vitals:   04/27/23 2326 04/28/23 0302 04/28/23 0500 04/28/23 1032  BP: (!) 169/65 (!) 111/94  (!) 153/59  Pulse: (!) 53 (!) 52 (!) 53 (!) 53  Resp: 19 18    Temp: 97.7 F (36.5 C) 97.7 F (36.5 C)  (!) 97 F (36.1 C)  TempSrc: Axillary Axillary  Axillary  SpO2: 99% 99% 100% 99%  Weight:   (!) 138.1 kg   Height:       Weight change: 2.2 kg  Intake/Output Summary (Last 24 hours) at 04/28/2023 1125 Last data filed at 04/27/2023 2346 Gross per 24 hour  Intake 479.21 ml  Output 450 ml  Net 29.21 ml       Labs: RENAL PANEL Recent Labs  Lab 04/22/23 0612 04/22/23 0614 04/24/23 0400 04/25/23 0442 04/26/23 0705 04/27/23 0530 04/27/23 2145 04/28/23 0530  NA  --    < > 131* 130* 134* 132* 131* 134*  K   --    < > 3.6 3.5 3.5 3.6 3.9 3.8  CL  --    < > 97* 99 98 100 96* 100  CO2  --    < > 19* 22 24 21* 19* 21*  GLUCOSE  --    < > 184* 196* 137* 193* 257* 177*  BUN  --    < > 82* 64* 63* 63* 69* 70*  CREATININE  --    < > 8.14* 7.16* 8.00* 8.69* 9.02* 9.06*  CALCIUM  --    < > 8.3* 8.0* 8.7* 8.4* 8.8* 8.5*  MG 2.0  --   --   --   --   --  2.1  --   PHOS  --    < > 5.1* 4.6 4.9* 5.3*  --  5.6*  ALBUMIN  --    < > 2.2* 2.3* 2.3* 2.3*  --  2.4*   < > = values in this interval not displayed.    Liver Function Tests: Recent Labs  Lab 04/26/23 0705 04/27/23 0530 04/28/23 0530  ALBUMIN 2.3* 2.3* 2.4*   No results for input(s): "LIPASE", "AMYLASE" in the last 168 hours. No results for input(s): "AMMONIA" in the last 168 hours. CBC: Recent Labs    03/28/23 0810 03/29/23 0407 04/24/23 0400 04/25/23 0442 04/26/23 0705 04/27/23 0530 04/28/23 0530  HGB  --    < >  7.8* 8.0* 7.9* 7.7* 7.8*  MCV  --    < > 84.9 85.0 85.7 86.1 86.6  VITAMINB12 305  --   --   --   --   --   --    < > = values in this interval not displayed.    Cardiac Enzymes: Recent Labs  Lab 04/25/23 0442  CKTOTAL 187   CBG: Recent Labs  Lab 04/27/23 1157 04/27/23 1735 04/27/23 2340 04/28/23 0633 04/28/23 1036  GLUCAP 128* 205* 196* 146* 179*    Iron Studies: No results for input(s): "IRON", "TIBC", "TRANSFERRIN", "FERRITIN" in the last 72 hours. Studies/Results: No results found.  Medications: Infusions:  heparin 1,550 Units/hr (04/28/23 0332)    Scheduled Medications:  allopurinol  50 mg Oral Daily   amiodarone  200 mg Oral BID   atorvastatin  40 mg Oral Daily   carvedilol  3.125 mg Oral BID WC   Chlorhexidine Gluconate Cloth  6 each Topical Q0600   clopidogrel  75 mg Oral Q breakfast   docusate sodium  100 mg Oral BID   DULoxetine  60 mg Oral Daily   ezetimibe  10 mg Oral Daily   gabapentin  300 mg Oral QHS   guaiFENesin  600 mg Oral BID   insulin aspart  0-9 Units Subcutaneous Q6H    insulin glargine  40 Units Subcutaneous Daily   levothyroxine  88 mcg Oral QAC breakfast   polyethylene glycol  17 g Oral Daily   sodium chloride flush  3 mL Intravenous Q12H   sodium chloride flush  3-10 mL Intravenous Q12H   tamsulosin  0.4 mg Oral Daily    have reviewed scheduled and prn medications.  Physical Exam: General:NAD, comfortable Heart:RRR, s1s2 nl Lungs: Reduced breath sound bilateral basal Abdomen:soft, Non-tender, distended Extremities:b/l leg edema++, dependent edema present Dialysis Access: TDC in place  Donice Alperin Prasad Heela Heishman 04/28/2023,11:25 AM  LOS: 32 days

## 2023-04-28 NOTE — Progress Notes (Signed)
 [facility]                                                                              Harold [name], is a 68 y.o. male, DOB - 02/20/1955, YQM:[id number] Admit date - 03/27/2023    Outpatient Primary MD for the patient is [name], MD  LOS - 32  days  Chief Complaint  Patient presents with   Cough       Brief summary  Patient is a 68 year old male with morbid obesity, OSA, DM2, HTN, HLD, CAD/stent, A-fib on Eliquis, carotid artery stenosis, CKD, chronic anemia, anxiety/depression, diabetic neuropathy, GERD, hypothyroidism, COPD, pulmonary hypertension. 2/6, patient presented to the ED with complaint of cough, subjective fever, myalgia, generalized weakness for 5 days. Respiratory virus panel positive for influenza A. Chest x-ray negative for infiltrates. Also noted to have right leg cellulitis.  04/28/2023: Patient seen.  Improved urine output.  Stable serum creatinine.  Nephrology input is appreciated.  Patient will be discharged once cleared for discharge by the nephrology team. Assessment & Plan   Acute respiratory failure with hypoxia Influenza A -Originally admitted for influenza A on 03-27-2023.  Presented with 5 days of cough, fever, myalgia.  -Respiratory virus panel positive for influenza A.  -Chest x-ray without acute infiltrate.  Improved with symptomatic treatment, was outside 48-hour window for Tamiflu hence was not initiated.  -wean O2 as tolerated 04/28/2023: Continue supportive care.   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 -S/p right ankle hardware removal, I&D and wound VAC placement on 2/10 by Dr. Jena Gauss -S/p repeat debridement of the right ankle by Dr. Lajoyce Corners 04/02/2023.   -ID was consulted, initially on cefepime, subsequently on daptomycin and ertapenem, now antibiotics held due to worsening renal  function.  04/26/2023: Communicated with infectious disease team.  Patient is currently monitored off of antibiotics for now.   AKI on CKD stage IIIb:  Nephrotic range proteinuria, uremia, Anion gap metabolic acidosis -Baseline creatinine appears to be 1.9-2.2.   -Renal function has continued to progressively worsen -Nephrology following, renal biopsy deferred due to high risk of bleeding and on Plavix for recent stent for PAD  -TDC placed on 3/4, underwent first HD session  -creatinine continues to rise, 8.14 today, plan for HD today 04/26/2023: Continue hemodialysis as per nephrology team.   04/27/2023: Nephrology is managing.  Urine output seems to be improving.  Discharge patient once cleared by the nephrology team. 04/28/2023: Improved urine output.  Right facial droop -On 3/1 patient noted to have right facial droop, evaluated by neurology, MRI of the brain negative for acute stroke. -Currently on Plavix.  PAD -Seen by vascular surgery underwent aortogram, right leg angiogram, successful stenting of right superficial femoral artery on 04/01/2023.   -Continue Plavix, Zetia  Acute metabolic encephalopathy -Likely multifactorial, uremia, infection, drug-induced from possibility of cefepime induced encephalopathy, chronic worsening memory issues -Started on hemodialysis Continue Cymbalta, decreased Neurontin 04/26/2023: Encephalopathy seems to have resolved.   Type 2 diabetes mellitus / Diabetic neuropathy, POA: A1c 7 on 01/02/2023. PTA meds-Tresiba 60 units daily,  Mounjaro  CBG (last 3)  Recent Labs    04/28/23 0633 04/28/23 1036 04/28/23 1725  GLUCAP 146* 179* 246*   -Continue Semglee, SSI   Diabetic neuropathy -Gabapentin decreased due to worsening renal function. (PTA reportedly on gabapentin 1600 mg 3 times daily)   Hypokalemia/hypomagnesemia: Improved   Paroxysmal A-fib -HR controlled, continue Coreg, amiodarone  -CHADS-VASc of 4 -Eliquis held secondary to bleeding  from CVL recently -Heparin IV resumed   Essential hypertension -BP stable, continue low-dose Coreg   Anemia of chronic disease: Baseline hemoglobin between 10.5 11.5.   -H&H stable, borderline  CAD/stent Carotid artery stenosis HLD Continue PTA meds -Plavix, Crestor -Eliquis held   Hypothyroidism Continue Synthroid   Obstructive sleep apnea CPAP at bedtime    Gout Continue allopurinol, colchicine    Orthostatic hypotension -Received brief IV fluids, continue abdominal binder -2D echo showed EF 55 to 60%, basal inferior hypokinesis, G2 DD    Generalized debility -PT had recommended SNF however patient will have to pay out-of-pocket not able to afford.  Acute diarrhea Resolved   Influenza A:  - Presented with 5 days of cough, fever, myalgia.  - Respiratory virus panel positive for influenza A. Chest x-ray without acute infiltrate -Improved with symptomatic treatment, was outside 48-hour window for Tamiflu hence was not initiated.   Moderate obesity, class II Estimated body mass index is 40.17 kg/m as calculated from the following:   Height as of this encounter: 6\' 1"  (1.854 m).   Weight as of this encounter: 138.1 kg.  Code Status: Full code DVT Prophylaxis:  Place and maintain sequential compression device Start: 04/15/23 1549 SCDs Start: 04/02/23 1617 apixaban (ELIQUIS) tablet 5 mg   Level of Care: Level of care: Progressive Cardiac Family Communication:  Disposition Plan:      Remains inpatient appropriate:      Procedures:  2D echo [facility] placement 04/22/23, started on HD #1 on 04/22/23  Consultants:   Renal Ortho ID   Antimicrobials:   Anti-infectives (From admission, onward)    Start     Dose/Rate Route Frequency Ordered Stop   04/22/23 1300  vancomycin (VANCOCIN) IVPB 1000 mg/200 mL premix        1,000 mg 200 mL/hr over 60 Minutes Intravenous To Radiology 04/22/23 1110 04/22/23 1849   04/19/23 1000  ertapenem (INVANZ) 500 mg in sodium chloride  0.9 % 50 mL IVPB  Status:  Discontinued        500 mg 100 mL/hr over 30 Minutes Intravenous Daily 04/18/23 0931 04/18/23 1106   04/17/23 1800  ertapenem (INVANZ) 1 g in sodium chloride 0.9 % 100 mL IVPB  Status:  Discontinued        1 g 200 mL/hr over 30 Minutes Intravenous Daily 04/17/23 1354 04/18/23 0931   04/08/23 0000  daptomycin (CUBICIN) IVPB  Status:  Discontinued        700 mg Intravenous Every 24 hours 04/08/23 1147 04/08/23    04/08/23 0000  ceFEPime (MAXIPIME) IVPB  Status:  Discontinued        2 g Intravenous Every 12 hours 04/08/23 1147 04/18/23    04/08/23 0000  daptomycin (CUBICIN) IVPB  Status:  Discontinued        700 mg Intravenous Every 24 hours 04/08/23 1150 04/18/23    04/04/23 1400  DAPTOmycin (CUBICIN) IVPB 700 mg/181mL premix  Status:  Discontinued        700 mg 200 mL/hr over 30 Minutes Intravenous Daily 04/03/23 1435 04/18/23 1106   04/02/23 1715  ceFAZolin (ANCEF) IVPB 2g/100 mL premix  Status:  Discontinued        2 g 200 mL/hr over 30 Minutes Intravenous Every 8 hours 04/02/23 1616 04/02/23 1623   04/02/23 0600  ceFAZolin (ANCEF) IVPB 3g/150 mL premix  Status:  Discontinued        3 g 300 mL/hr over 30 Minutes Intravenous On call to O.R. 04/01/23 1700 04/02/23 1549   03/31/23 1015  ceFAZolin (ANCEF) IVPB 2g/100 mL premix        2 g 200 mL/hr over 30 Minutes Intravenous On call to O.R. 03/31/23 0921 03/31/23 1203   03/29/23 2200  vancomycin (VANCOREADY) IVPB 2000 mg/400 mL  Status:  Discontinued       Placed in "Followed by" Linked Group   2,000 mg 200 mL/hr over 120 Minutes Intravenous Every 48 hours 03/27/23 2257 04/03/23 1435   03/28/23 1045  metroNIDAZOLE (FLAGYL) tablet 500 mg  Status:  Discontinued        500 mg Oral Every 12 hours 03/28/23 0955 04/03/23 0945   03/27/23 2330  vancomycin (VANCOCIN) 2,500 mg in sodium chloride 0.9 % 500 mL IVPB       Placed in "Followed by" Linked Group   2,500 mg 262.5 mL/hr over 120 Minutes Intravenous  Once  03/27/23 2257 03/28/23 0241   03/27/23 2300  metroNIDAZOLE (FLAGYL) IVPB 500 mg  Status:  Discontinued        500 mg 100 mL/hr over 60 Minutes Intravenous Every 12 hours 03/27/23 2247 03/28/23 0955   03/27/23 2300  ceFEPIme (MAXIPIME) 2 g in sodium chloride 0.9 % 100 mL IVPB  Status:  Discontinued        2 g 200 mL/hr over 30 Minutes Intravenous Every 12 hours 03/27/23 2257 04/17/23 1354   03/27/23 2100  ceFAZolin (ANCEF) IVPB 2g/100 mL premix        2 g 200 mL/hr over 30 Minutes Intravenous  Once 03/27/23 2058 03/27/23 2219          Medications  allopurinol  50 mg Oral Daily   amiodarone  200 mg Oral BID   apixaban  5 mg Oral BID   atorvastatin  40 mg Oral Daily   carvedilol  3.125 mg Oral BID WC   Chlorhexidine Gluconate Cloth  6 each Topical Q0600   clopidogrel  75 mg Oral Q breakfast   docusate sodium  100 mg Oral BID   DULoxetine  60 mg Oral Daily   ezetimibe  10 mg Oral Daily   furosemide  80 mg Intravenous BID   gabapentin  300 mg Oral QHS   guaiFENesin  600 mg Oral BID   insulin aspart  0-9 Units Subcutaneous Q6H   insulin glargine  40 Units Subcutaneous Daily   levothyroxine  88 mcg Oral QAC breakfast   polyethylene glycol  17 g Oral Daily   sodium chloride flush  3 mL Intravenous Q12H   sodium chloride flush  3-10 mL Intravenous Q12H   tamsulosin  0.4 mg Oral Daily      Subjective:   Harold [name] was seen and examined today.  Somnolent but easily arousable, denies any specific complaints.  No chest pain, shortness of breath, fevers or chills, on CPAP Objective:   Vitals:   04/28/23 1032 04/28/23 1221 04/28/23 1741 04/28/23 1942  BP: (!) 153/59 126/79 (!) 150/58 (!) 148/52  Pulse: (!) 53  (!) 55 (!) 54  Resp:   18 18  Temp: (!) 97 F (36.1 C)  (!)  97.5 F (36.4 C) 98.3 F (36.8 C)  TempSrc: Axillary   Axillary  SpO2: 99% 99% 95% 97%  Weight:      Height:        Intake/Output Summary (Last 24 hours) at 04/28/2023 1950 Last data filed at  04/27/2023 2346 Gross per 24 hour  Intake 138.53 ml  Output --  Net 138.53 ml      Wt Readings from Last 3 Encounters:  04/28/23 (!) 138.1 kg  01/28/23 (!) 142.9 kg  01/09/23 (!) 145 kg    Physical Exam General: Patient is obese.  Awake and alert.  Not in any distress.   Cardiovascular: S1 S2   Respiratory: Clear to auscultation. Gastrointestinal: Obese, soft and nontender. Ext: No significant lower extremity edema.  Right lower leg and foot in a splint.   Data Reviewed:  I have personally reviewed following labs    CBC Lab Results  Component Value Date   WBC 6.2 04/28/2023   RBC 2.69 (L) 04/28/2023   HGB 7.8 (L) 04/28/2023   HCT 23.3 (L) 04/28/2023   MCV 86.6 04/28/2023   MCH 29.0 04/28/2023   PLT 181 04/28/2023   MCHC 33.5 04/28/2023   RDW 15.8 (H) 04/28/2023   LYMPHSABS 0.9 04/15/2023   MONOABS 0.6 04/15/2023   EOSABS 0.3 04/15/2023   BASOSABS 0.1 04/15/2023     Last metabolic panel Lab Results  Component Value Date   NA 134 (L) 04/28/2023   K 3.8 04/28/2023   CL 100 04/28/2023   CO2 21 (L) 04/28/2023   BUN 70 (H) 04/28/2023   CREATININE 9.06 (H) 04/28/2023   GLUCOSE 177 (H) 04/28/2023   GFRNONAA 6 (L) 04/28/2023   GFRAA 47 (L) 09/28/2019   CALCIUM 8.5 (L) 04/28/2023   PHOS 5.6 (H) 04/28/2023   PROT 5.8 (L) 04/16/2023   ALBUMIN 2.4 (L) 04/28/2023   LABGLOB 2.2 04/16/2023   AGRATIO 1.7 04/16/2023   BILITOT 0.6 04/16/2023   ALKPHOS 73 04/16/2023   AST 50 (H) 04/16/2023   ALT 42 04/16/2023   ANIONGAP 13 04/28/2023    CBG (last 3)  Recent Labs    04/28/23 0633 04/28/23 1036 04/28/23 1725  GLUCAP 146* 179* 246*      Coagulation Profile: No results for input(s): "INR", "PROTIME" in the last 168 hours.   Radiology Studies: I have personally reviewed the imaging studies  No results found.    Time spent 35 minutes.   Barnetta Chapel M.D. [facility] 04/28/2023, 7:50 PM  Available via Epic secure chat 7am-7pm After 7 pm,  please refer to night coverage provider listed on amion.

## 2023-04-28 NOTE — Progress Notes (Signed)
 PHARMACY - ANTICOAGULATION CONSULT NOTE  Pharmacy Consult for heparin Indication: atrial fibrillation  Allergies  Allergen Reactions   Doxycycline Hives and Rash   Septra [Bactrim] Itching   Penicillins Rash    Allergy to All cillin drugs  Ancef given 9/24 with no obvious reaction   Metformin And Related Diarrhea and Nausea Only   Other Nausea And Vomiting    General Anesthesia - sometimes causes nausea and vomiting * OK to use scopolamine patch     Sulfamethoxazole-Trimethoprim Other (See Comments)   Wellbutrin [Bupropion]     Mood Changes    Patient Measurements: Height: 6\' 1"  (185.4 cm) Weight: (!) 138.1 kg (304 lb 7.3 oz) IBW/kg (Calculated) : 79.9 Heparin Dosing Weight: 112.8 kg  Vital Signs: Temp: 97 F (36.1 C) (03/10 1032) Temp Source: Axillary (03/10 1032) BP: 153/59 (03/10 1032) Pulse Rate: 53 (03/10 1032)  Labs: Recent Labs    04/26/23 0705 04/27/23 0530 04/27/23 1534 04/27/23 2145 04/28/23 0530  HGB 7.9* 7.7*  --   --  7.8*  HCT 22.7* 22.3*  --   --  23.3*  PLT 171 172  --   --  181  HEPARINUNFRC 0.31 0.28* 0.34  --  0.31  CREATININE 8.00* 8.69*  --  9.02* 9.06*    Estimated Creatinine Clearance: 11.2 mL/min (A) (by C-G formula based on SCr of 9.06 mg/dL (H)).   Medical History: Past Medical History:  Diagnosis Date   Anemia    Anginal pain (HCC) 04/2013   Cardiac cath showed patent stents with distal LAD, circumflex-OM and RCA disease in small vessels.   Anxiety    Atrial fibrillation (HCC) 12/2021   CAD S/P percutaneous coronary angioplasty 2003, 04/2012   status post PCI to LAD, circumflex-OM 2, RCA   Carotid artery occlusion    Chronic renal insufficiency, stage II (mild)    Chronic venous insufficiency    varicosities, no reflux; dopplers 04/14/12- valvular insufficiency in the R and L GSV   Complication of anesthesia    COPD, mild (HCC)    Depression    situaltional    Diabetes mellitus    Diabetic neuropathy (HCC) 08/24/2019    Dyslipidemia associated with type 2 diabetes mellitus (HCC)    Fall at home, initial encounter 01/02/2023   GERD (gastroesophageal reflux disease)    History of cardioversion 12/2021   at [facility]   HTN (hypertension)    Hypothyroidism    Neuromuscular disorder (HCC)    neuropathy in feet   Neuropathy    notably improved following PCI with improved cardiac function   Obesity    Obesity, Class II, BMI 35.0-39.9, with comorbidity (see actual BMI)    BMI 39; wgt loss efforts in place; seeing Dietician   Open ankle fracture 01/02/2023   Orthostatic hypotension    PONV (postoperative nausea and vomiting)    "[employer]"   Pulmonary hypertension (HCC) 04/2012   PA pressure   Sleep apnea    uses nightly   Vitamin D deficiency    per facility notes    Medications:  Scheduled:   allopurinol  50 mg Oral Daily   amiodarone  200 mg Oral BID   atorvastatin  40 mg Oral Daily   carvedilol  3.125 mg Oral BID WC   Chlorhexidine Gluconate Cloth  6 each Topical Q0600   clopidogrel  75 mg Oral Q breakfast   docusate sodium  100 mg Oral BID   DULoxetine  60 mg Oral Daily  ezetimibe  10 mg Oral Daily   gabapentin  300 mg Oral QHS   guaiFENesin  600 mg Oral BID   insulin aspart  0-9 Units Subcutaneous Q6H   insulin glargine  40 Units Subcutaneous Daily   levothyroxine  88 mcg Oral QAC breakfast   polyethylene glycol  17 g Oral Daily   sodium chloride flush  3 mL Intravenous Q12H   sodium chloride flush  3-10 mL Intravenous Q12H   tamsulosin  0.4 mg Oral Daily   Infusions:   heparin 1,550 Units/hr (04/28/23 1324)    Assessment: 68 y/o male with history of afib on apixaban. Eliquis has been held d/t bleeding from CVL site. IV Heparin was started but then stopped 3/2 due to bleeding from the IV line and has remained on hold for HD cath placement. Plans continue IV heparin then resume apixaban later.  -heparin level= 0.31 on 1550  units/hr -hg= 7.8  Goal of Therapy:  Heparin  level 0.3-0.5 units/mL Monitor platelets by anticoagulation protocol: Yes   Plan:  -Continue heparin 1550 units/hr -Daily heparin level and CBC -Will follow plans for oral anticoagulation  Harland German, PharmD Clinical Pharmacist **Pharmacist phone directory can now be found on [email or web address] ([email or web address]).  Listed under [facility].

## 2023-04-28 NOTE — Progress Notes (Signed)
 Physical Therapy Treatment Patient Details Name: Harold [name] MRN: [id number] DOB: 1955-03-25 Today's Date: 04/28/2023   History of Present Illness 68 y.o. male presents to [facility] 03/27/23 with productive cough and generalized weakness. Admitted with influenza A and cellulitis of R foot s/p hardware removal of the R ankle and application of wound vac on 2/10, wound vac removed 2/27. Left thigh pain, CT negative. Prior admit 11/24 for R ankle ORIF and in 12/24 for revision. S/p excisional debridement of right ankle on 2/12. Pt underwent central line placement on 04/07/2023 however he has been having issues with recurrent bleeding from the insertion site which has finally stopped. 04/17/23 pt with drop in sats requiring 2 L of O2 via [place], chest xray concerning for edema or possibly pneumonia.Significant PMH: HTN, HLD, CAD s/p PCI, COPD, DM2, CKD stage IIIb, PVD, gout, OSA, neuropathy, COPD, a-fib, CAD s/p percutaneous coronary angioplasty, angina, obesity    PT Comments  STAR PT/OT Session: Pt reports getting up both Saturday and Sunday. Therapist report proud of him for trying to increase his tolerance for upright and out of bed. Pt is agreeable to wearing abdominal binder and trying to walk today. Pt continues to have significant symptomatic drop in BP with standing(see General Comments). Attempted to improve BP with marching in place, still has significant drop. Decided to push forward with ambulation initial bout of ambulation 10 feet before needing to sit for dizziness. After seated rest break and recovery of BP pt able to ambulate 20 feet before needing to sit for dizziness. Pt requiring min-modAx2 for transfers and ambulation. Will continue to progress ambulation this week in therapy.     If plan is discharge home, recommend the following: A lot of help with walking and/or transfers;A lot of help with bathing/dressing/bathroom;Assistance with cooking/housework;Assist for transportation;Help with stairs  or ramp for entrance   Can travel by private vehicle     Yes  Equipment Recommendations  BSC/3in1       Precautions / Restrictions Precautions Precautions: Fall Recall of Precautions/Restrictions: Impaired Precaution/Restrictions Comments: Watch BP Required Braces or Orthoses: Other Brace Other Brace: R CAM boot for ambulation, R splint in bed, abdominal binder (Prn) Restrictions Weight Bearing Restrictions Per Provider Order: Yes RLE Weight Bearing Per Provider Order: Weight bearing as tolerated Other Position/Activity Restrictions: per Dr. Audrie Lia note 04/03/2023 and orders 04/03/2023 pt WBAT     Mobility  Bed Mobility Overal bed mobility: Needs Assistance Bed Mobility: Supine to Sit     Supine to sit: Mod assist, HOB elevated, Used rails     General bed mobility comments: increased time and cues throughout    Transfers Overall transfer level: Needs assistance Equipment used: Rolling walker (2 wheels) Transfers: Sit to/from Stand, Bed to chair/wheelchair/BSC Sit to Stand: Min assist, Mod assist, +2 physical assistance           General transfer comment: sit to stands from EOB with min to mod assist +2 x2 before ambulating in room with RW    Ambulation/Gait Ambulation/Gait assistance: Min assist, Mod assist, +2 physical assistance, +2 safety/equipment Gait Distance (Feet): 10 Feet (+20) Assistive device: Rolling walker (2 wheels) Gait Pattern/deviations: Step-through pattern, Decreased step length - right, Decreased step length - left, Steppage, Narrow base of support, Trunk flexed Gait velocity: reduced Gait velocity interpretation: <1.31 ft/sec, indicative of household ambulator   General Gait Details: pt able to tolerate 2 bouts of ambulation today, but continues to be limited in safe mobility by decreased BP in standing. min-modx2  physical support with very close chair follow for safety       Balance Overall balance assessment: Needs  assistance Sitting-balance support: Feet supported, Bilateral upper extremity supported Sitting balance-Leahy Scale: Fair Sitting balance - Comments: supervision for sitting EOB   Standing balance support: Bilateral upper extremity supported, During functional activity, Reliant on assistive device for balance Standing balance-Leahy Scale: Poor Standing balance comment: reliant on RW for support                            Communication Communication Communication: No apparent difficulties  Cognition Arousal: Alert Behavior During Therapy: WFL for tasks assessed/performed                             Following commands: Impaired Following commands impaired: Follows one step commands with increased time    Cueing Cueing Techniques: Verbal cues, Tactile cues  Exercises General Exercises - Lower Extremity Hip Flexion/Marching: Standing, 10 reps, AROM (in attempt to elevate BP on first bout of standing)    General Comments General comments (skin integrity, edema, etc.): BP seated on EOB 116/61(77), ambulating 96/52, after short rest 129/53, after walking 20 feet 99/47, end of session seated in recliner 126/79      Pertinent Vitals/Pain Pain Assessment Pain Assessment: Faces Faces Pain Scale: Hurts a little bit Pain Location: generalized Pain Descriptors / Indicators: Discomfort, Grimacing Pain Intervention(s): Limited activity within patient's tolerance, Monitored during session, Repositioned     PT Goals (current goals can now be found in the care plan section) Acute Rehab PT Goals PT Goal Formulation: With patient Time For Goal Achievement: 05/01/23 Potential to Achieve Goals: Fair Progress towards PT goals: Progressing toward goals    Frequency    Min 1X/week       Co-evaluation   Reason for Co-Treatment: To address functional/ADL transfers;For patient/therapist safety;Other (comment) (activity tolerance) PT goals addressed during session:  Mobility/safety with mobility;Proper use of DME;Balance OT goals addressed during session: ADL's and self-care      AM-PAC PT "6 Clicks" Mobility   Outcome Measure  Help needed turning from your back to your side while in a flat bed without using bedrails?: A Little Help needed moving from lying on your back to sitting on the side of a flat bed without using bedrails?: A Lot Help needed moving to and from a bed to a chair (including a wheelchair)?: A Lot Help needed standing up from a chair using your arms (e.g., wheelchair or bedside chair)?: A Lot Help needed to walk in hospital room?: A Lot Help needed climbing 3-5 steps with a railing? : Total 6 Click Score: 12    End of Session Equipment Utilized During Treatment: Gait belt Activity Tolerance: Treatment limited secondary to medical complications (Comment) (low BP) Patient left: in chair;with call bell/phone within reach;with chair alarm set Nurse Communication: Mobility status;Other (comment) (lHR in 40s at end of session) PT Visit Diagnosis: Other abnormalities of gait and mobility (R26.89);Unsteadiness on feet (R26.81);Muscle weakness (generalized) (M62.81);History of falling (Z91.81);Difficulty in walking, not elsewhere classified (R26.2)     Time: 1610-9604 PT Time Calculation (min) (ACUTE ONLY): 53 min  Charges:    $Gait Training: 23-37 mins PT General Charges $$ ACUTE PT VISIT: 1 Visit                     Keanu Lesniak B. Beverely Risen PT, DPT Acute Rehabilitation Services  Please use secure chat or  Call Office [phone]    Elon Alas [facility] 04/28/2023, 2:46 PM

## 2023-04-28 NOTE — Plan of Care (Signed)
 Messaged by Titus Mould, RPH to discontinue heparin once first dose of eliqis was given. Heparin stopped at 22:43.

## 2023-04-28 NOTE — Progress Notes (Signed)
 Occupational Therapy Treatment Patient Details Name: Harold [name] MRN: [id number] DOB: Jun 21, 1955 Today's Date: 04/28/2023   History of present illness 68 y.o. male presents to [facility] 03/27/23 with productive cough and generalized weakness. Admitted with influenza A and cellulitis of R foot s/p hardware removal of the R ankle and application of wound vac on 2/10, wound vac removed 2/27. Left thigh pain, CT negative. Prior admit 11/24 for R ankle ORIF and in 12/24 for revision. S/p excisional debridement of right ankle on 2/12. Pt underwent central line placement on 04/07/2023 however he has been having issues with recurrent bleeding from the insertion site which has finally stopped. 04/17/23 pt with drop in sats requiring 2 L of O2 via [place], chest xray concerning for edema or possibly pneumonia.Significant PMH: HTN, HLD, CAD s/p PCI, COPD, DM2, CKD stage IIIb, PVD, gout, OSA, neuropathy, COPD, a-fib, CAD s/p percutaneous coronary angioplasty, angina, obesity   OT comments  STAR Patient OT/PT session: Patient continues to progress with OT/PT treatment. Patient making gains with bed mobility, sit to stands, and transfers. Patient continues to be orthostatic when standing but recovers quickly when seated. Patient encouraged to sit up long in days to address BP. Patient to continue to be followed by acute OT with STAR program to address established goals to facilitate DC to next venue of care.       If plan is discharge home, recommend the following:  A lot of help with walking and/or transfers;A lot of help with bathing/dressing/bathroom;Assistance with cooking/housework;Help with stairs or ramp for entrance;Assist for transportation   Equipment Recommendations  Other (comment) (bariatric BSC)    Recommendations for Other Services      Precautions / Restrictions Precautions Precautions: Fall Recall of Precautions/Restrictions: Impaired Precaution/Restrictions Comments: Watch BP Required Braces or  Orthoses: Other Brace Other Brace: R CAM boot for ambulation, R splint in bed, abdominal binder (Prn) Restrictions Weight Bearing Restrictions Per Provider Order: Yes RLE Weight Bearing Per Provider Order: Weight bearing as tolerated Other Position/Activity Restrictions: per Dr. Audrie Lia note 04/03/2023 and orders 04/03/2023 pt WBAT       Mobility Bed Mobility Overal bed mobility: Needs Assistance Bed Mobility: Supine to Sit     Supine to sit: Mod assist, HOB elevated, Used rails     General bed mobility comments: increased time and cues throughout    Transfers Overall transfer level: Needs assistance Equipment used: Rolling walker (2 wheels) Transfers: Sit to/from Stand, Bed to chair/wheelchair/BSC Sit to Stand: Min assist, Mod assist, +2 physical assistance           General transfer comment: sit to stands from EOB with min to mod assist +2 x2 before ambulating in room with RW     Balance Overall balance assessment: Needs assistance Sitting-balance support: Feet supported, Bilateral upper extremity supported Sitting balance-Leahy Scale: Fair Sitting balance - Comments: supervision for sitting EOB   Standing balance support: Bilateral upper extremity supported, During functional activity, Reliant on assistive device for balance Standing balance-Leahy Scale: Poor Standing balance comment: reliant on RW for support                           ADL either performed or assessed with clinical judgement   ADL Overall ADL's : Needs assistance/impaired                     Lower Body Dressing: Total assistance;Bed level Lower Body Dressing Details (indicate cue type and reason):  donned CAM boot and splint for RLE                    Extremity/Trunk Assessment              Vision       Perception     Praxis     Communication Communication Communication: No apparent difficulties   Cognition Arousal: Alert Behavior During Therapy: WFL for  tasks assessed/performed Cognition: No apparent impairments                               Following commands: Impaired Following commands impaired: Follows one step commands with increased time      Cueing   Cueing Techniques: Verbal cues, Tactile cues  Exercises      Shoulder Instructions       General Comments BP seated on EOB 116/61(77), ambulating 96/52, after short rest 129/53, after walking 20 feet 99/47, end of session seated in recliner 126/79    Pertinent Vitals/ Pain       Pain Assessment Pain Assessment: Faces Faces Pain Scale: Hurts a little bit Pain Location: generalized Pain Descriptors / Indicators: Discomfort, Grimacing Pain Intervention(s): Monitored during session, Repositioned  Home Living                                          Prior Functioning/Environment              Frequency  Min 1X/week        Progress Toward Goals  OT Goals(current goals can now be found in the care plan section)  Progress towards OT goals: Progressing toward goals  Acute Rehab OT Goals Patient Stated Goal: to go home OT Goal Formulation: With patient Time For Goal Achievement: 05/01/23 Potential to Achieve Goals: Good ADL Goals Pt Will Perform Grooming: with set-up;sitting Pt Will Perform Lower Body Dressing: with min assist;with adaptive equipment;with caregiver independent in assisting;sitting/lateral leans;sit to/from stand Pt Will Transfer to Toilet: stand pivot transfer;with min assist;bedside commode Pt Will Perform Toileting - Clothing Manipulation and hygiene: with min assist;sitting/lateral leans;sit to/from stand Pt/caregiver will Perform Home Exercise Program: Increased strength;Both right and left upper extremity;With theraband;With written HEP provided Additional ADL Goal #1: Pt will be Mod I in and OOB for basic ADLs  Plan      Co-evaluation    PT/OT/SLP Co-Evaluation/Treatment: Yes Reason for Co-Treatment: To  address functional/ADL transfers;For patient/therapist safety;Other (comment) (activity tolerance) PT goals addressed during session: Mobility/safety with mobility;Proper use of DME;Balance OT goals addressed during session: ADL's and self-care      AM-PAC OT "6 Clicks" Daily Activity     Outcome Measure   Help from another person eating meals?: None Help from another person taking care of personal grooming?: A Little Help from another person toileting, which includes using toliet, bedpan, or urinal?: A Lot Help from another person bathing (including washing, rinsing, drying)?: A Lot Help from another person to put on and taking off regular upper body clothing?: A Little Help from another person to put on and taking off regular lower body clothing?: Total 6 Click Score: 15    End of Session Equipment Utilized During Treatment: Gait belt;Rolling walker (2 wheels);Other (comment) (right CAM boot)  OT Visit Diagnosis: Unsteadiness on feet (R26.81);Other abnormalities of gait and mobility (R26.89);Muscle weakness (generalized) (M62.81);Pain  Activity Tolerance Patient tolerated treatment well   Patient Left in chair;with call bell/phone within reach;with chair alarm set   Nurse Communication Mobility status        Time: 1027-2536 OT Time Calculation (min): 39 min  Charges: OT General Charges $OT Visit: 1 Visit OT Treatments $Self Care/Home Management : 8-22 mins  Alfonse Flavors, OTA Acute Rehabilitation Services  Office [phone]   Dewain Penning 04/28/2023, 12:56 PM

## 2023-04-28 NOTE — Progress Notes (Signed)
 PHARMACY - ANTICOAGULATION CONSULT NOTE  Pharmacy Consult for heparin Indication: atrial fibrillation  Allergies  Allergen Reactions   Doxycycline Hives and Rash   Septra [Bactrim] Itching   Penicillins Rash    Allergy to All cillin drugs  Ancef given 9/24 with no obvious reaction   Metformin And Related Diarrhea and Nausea Only   Other Nausea And Vomiting    General Anesthesia - sometimes causes nausea and vomiting * OK to use scopolamine patch     Sulfamethoxazole-Trimethoprim Other (See Comments)   Wellbutrin [Bupropion]     Mood Changes    Patient Measurements: Height: 6\' 1"  (185.4 cm) Weight: (!) 138.1 kg (304 lb 7.3 oz) IBW/kg (Calculated) : 79.9 Heparin Dosing Weight: 112.8 kg  Vital Signs: Temp: 97 F (36.1 C) (03/10 1221) Temp Source: Axillary (03/10 1032) BP: 126/79 (03/10 1221) Pulse Rate: 53 (03/10 1032)  Labs: Recent Labs    04/26/23 0705 04/27/23 0530 04/27/23 1534 04/27/23 2145 04/28/23 0530  HGB 7.9* 7.7*  --   --  7.8*  HCT 22.7* 22.3*  --   --  23.3*  PLT 171 172  --   --  181  HEPARINUNFRC 0.31 0.28* 0.34  --  0.31  CREATININE 8.00* 8.69*  --  9.02* 9.06*    Estimated Creatinine Clearance: 11.2 mL/min (A) (by C-G formula based on SCr of 9.06 mg/dL (H)).   Medical History: Past Medical History:  Diagnosis Date   Anemia    Anginal pain (HCC) 04/2013   Cardiac cath showed patent stents with distal LAD, circumflex-OM and RCA disease in small vessels.   Anxiety    Atrial fibrillation (HCC) 12/2021   CAD S/P percutaneous coronary angioplasty 2003, 04/2012   status post PCI to LAD, circumflex-OM 2, RCA   Carotid artery occlusion    Chronic renal insufficiency, stage II (mild)    Chronic venous insufficiency    varicosities, no reflux; dopplers 04/14/12- valvular insufficiency in the R and L GSV   Complication of anesthesia    COPD, mild (HCC)    Depression    situaltional    Diabetes mellitus    Diabetic neuropathy (HCC) 08/24/2019    Dyslipidemia associated with type 2 diabetes mellitus (HCC)    Fall at home, initial encounter 01/02/2023   GERD (gastroesophageal reflux disease)    History of cardioversion 12/2021   at [facility]   HTN (hypertension)    Hypothyroidism    Neuromuscular disorder (HCC)    neuropathy in feet   Neuropathy    notably improved following PCI with improved cardiac function   Obesity    Obesity, Class II, BMI 35.0-39.9, with comorbidity (see actual BMI)    BMI 39; wgt loss efforts in place; seeing Dietician   Open ankle fracture 01/02/2023   Orthostatic hypotension    PONV (postoperative nausea and vomiting)    "[employer]"   Pulmonary hypertension (HCC) 04/2012   PA pressure   Sleep apnea    uses nightly   Vitamin D deficiency    per facility notes    Medications:  Scheduled:   allopurinol  50 mg Oral Daily   amiodarone  200 mg Oral BID   atorvastatin  40 mg Oral Daily   carvedilol  3.125 mg Oral BID WC   Chlorhexidine Gluconate Cloth  6 each Topical Q0600   clopidogrel  75 mg Oral Q breakfast   docusate sodium  100 mg Oral BID   DULoxetine  60 mg Oral Daily  ezetimibe  10 mg Oral Daily   furosemide  80 mg Intravenous BID   gabapentin  300 mg Oral QHS   guaiFENesin  600 mg Oral BID   insulin aspart  0-9 Units Subcutaneous Q6H   insulin glargine  40 Units Subcutaneous Daily   levothyroxine  88 mcg Oral QAC breakfast   polyethylene glycol  17 g Oral Daily   sodium chloride flush  3 mL Intravenous Q12H   sodium chloride flush  3-10 mL Intravenous Q12H   tamsulosin  0.4 mg Oral Daily   Infusions:   heparin 1,550 Units/hr (04/28/23 1610)    Assessment: 68 y/o male with history of afib on apixaban. Eliquis has been held d/t bleeding from CVL site. IV Heparin was started but then stopped 3/2 due to bleeding from the IV line and has remained on hold for HD cath placement. IV heparin was restarted 3/5 with plans to resume apixaban later.  -Heparin level therapeutic at  0.31 on 1550 units/hr. Hgb 7.8 (stable), PLT 181 -plans are to restart apixaban tonight  Goal of Therapy:  Heparin level 0.3-0.5 units/mL Monitor platelets by anticoagulation protocol: Yes   Plan:  -Discontinue heparin at 10pm and restart apixaban 5mg  po bid tongight -Daily CBC for now  Harland German, PharmD Clinical Pharmacist **Pharmacist phone directory can now be found on [email or web address] ([email or web address]).  Listed under [facility].   -

## 2023-04-29 ENCOUNTER — Inpatient Hospital Stay (HOSPITAL_COMMUNITY)

## 2023-04-29 ENCOUNTER — Other Ambulatory Visit (HOSPITAL_COMMUNITY): Payer: Self-pay

## 2023-04-29 DIAGNOSIS — I2510 Atherosclerotic heart disease of native coronary artery without angina pectoris: Secondary | ICD-10-CM

## 2023-04-29 DIAGNOSIS — R55 Syncope and collapse: Secondary | ICD-10-CM

## 2023-04-29 DIAGNOSIS — I480 Paroxysmal atrial fibrillation: Secondary | ICD-10-CM | POA: Diagnosis not present

## 2023-04-29 DIAGNOSIS — L02415 Cutaneous abscess of right lower limb: Secondary | ICD-10-CM | POA: Diagnosis not present

## 2023-04-29 DIAGNOSIS — I10 Essential (primary) hypertension: Secondary | ICD-10-CM | POA: Diagnosis not present

## 2023-04-29 DIAGNOSIS — I739 Peripheral vascular disease, unspecified: Secondary | ICD-10-CM | POA: Diagnosis not present

## 2023-04-29 LAB — RENAL FUNCTION PANEL
Albumin: 2.5 g/dL — ABNORMAL LOW (ref 3.5–5.0)
Anion gap: 13 (ref 5–15)
BUN: 74 mg/dL — ABNORMAL HIGH (ref 8–23)
CO2: 23 mmol/L (ref 22–32)
Calcium: 8.9 mg/dL (ref 8.9–10.3)
Chloride: 100 mmol/L (ref 98–111)
Creatinine, Ser: 9.31 mg/dL — ABNORMAL HIGH (ref 0.61–1.24)
GFR, Estimated: 6 mL/min — ABNORMAL LOW (ref >60)
Glucose, Bld: 159 mg/dL — ABNORMAL HIGH (ref 70–99)
Phosphorus: 5.9 mg/dL — ABNORMAL HIGH (ref 2.5–4.6)
Potassium: 3.8 mmol/L (ref 3.5–5.1)
Sodium: 136 mmol/L (ref 135–145)

## 2023-04-29 LAB — GLUCOSE, CAPILLARY
Glucose-Capillary: 134 mg/dL — ABNORMAL HIGH (ref 70–99)
Glucose-Capillary: 138 mg/dL — ABNORMAL HIGH (ref 70–99)
Glucose-Capillary: 154 mg/dL — ABNORMAL HIGH (ref 70–99)
Glucose-Capillary: 232 mg/dL — ABNORMAL HIGH (ref 70–99)

## 2023-04-29 MED ORDER — HEPARIN SODIUM (PORCINE) 1000 UNIT/ML IJ SOLN
4200.0000 [IU] | INTRAMUSCULAR | Status: DC | PRN
  Administered 2023-04-29: 4200 [IU] via INTRAVENOUS
  Administered 2023-05-01: 3800 [IU] via INTRAVENOUS
  Administered 2023-05-03 – 2023-05-06 (×2): 4200 [IU] via INTRAVENOUS
  Filled 2023-04-29: qty 5
  Filled 2023-04-29 (×4): qty 4.2
  Filled 2023-04-29 (×2): qty 5
  Filled 2023-04-29: qty 4.2

## 2023-04-29 MED ORDER — CHLORHEXIDINE GLUCONATE CLOTH 2 % EX PADS
6.0000 | MEDICATED_PAD | Freq: Every day | CUTANEOUS | Status: DC
  Administered 2023-04-30: 6 via TOPICAL

## 2023-04-29 NOTE — Progress Notes (Signed)
 Occupational Therapy Treatment Patient Details Name: Harold [name] MRN: [id number] DOB: September 24, 1955 Today's Date: 04/29/2023   History of present illness 68 y.o. male presents to [facility] 03/27/23 with productive cough and generalized weakness. Admitted with influenza A and cellulitis of R foot s/p hardware removal of the R ankle and application of wound vac on 2/10, wound vac removed 2/27. Left thigh pain, CT negative. Prior admit 11/24 for R ankle ORIF and in 12/24 for revision. S/p excisional debridement of right ankle on 2/12. Pt underwent central line placement on 04/07/2023 however he has been having issues with recurrent bleeding from the insertion site which has finally stopped. 04/17/23 pt with drop in sats requiring 2 L of O2 via [place], chest xray concerning for edema or possibly pneumonia.Significant PMH: HTN, HLD, CAD s/p PCI, COPD, DM2, CKD stage IIIb, PVD, gout, OSA, neuropathy, COPD, a-fib, CAD s/p percutaneous coronary angioplasty, angina, obesity   OT comments  STAR Patient OT/PT session: Patient able to get to EOB with CGA using bed features and 116/62. Patient performed dressing seated on EOB and stood to pull up clothing. Patient was able to stand from raised bed and transfer to recliner with min assist of one with BP 111/56 before sitting. Patient performed mobility in hallway to simulate home distance and able to ambulate 20 feet with mod assist +2 to stand from recliner with BP 110/59. Second attempt performed for mobility with decreased distance and BP 125/48. Performed simulated mobility and transfer to simulate ambulating into bathroom at home and performing toilet transfer with BP 101/63. Discussed home environment with patient and husband via phone. Therapy voiced concerns of ambulating distance to bathroom and recommended BSC in den for increased safety. Therapy recommended assistance for self care, transfers, and mobility. Acute OT to continue to follow to address established goals to  facilitate DC to next venue of care.       If plan is discharge home, recommend the following:  A lot of help with walking and/or transfers;A lot of help with bathing/dressing/bathroom;Assistance with cooking/housework;Help with stairs or ramp for entrance;Assist for transportation   Equipment Recommendations  Other (comment) (bariatric BSC)    Recommendations for Other Services      Precautions / Restrictions Precautions Precautions: Fall Recall of Precautions/Restrictions: Impaired Precaution/Restrictions Comments: Watch BP Required Braces or Orthoses: Other Brace Other Brace: R CAM boot for ambulation, R splint in bed, abdominal binder (Prn) Restrictions Weight Bearing Restrictions Per Provider Order: Yes RLE Weight Bearing Per Provider Order: Weight bearing as tolerated Other Position/Activity Restrictions: per Dr. Audrie Lia note 04/03/2023 and orders 04/03/2023 pt WBAT       Mobility Bed Mobility Overal bed mobility: Needs Assistance Bed Mobility: Supine to Sit     Supine to sit: Contact guard, HOB elevated, Used rails     General bed mobility comments: increased time and use of bed features    Transfers Overall transfer level: Needs assistance Equipment used: Rolling walker (2 wheels) Transfers: Sit to/from Stand, Bed to chair/wheelchair/BSC Sit to Stand: Min assist, Mod assist, +2 physical assistance           General transfer comment: min assist of one from raised bed for sit to stand and patient able to perform step pivot transfer to recliner. Sit to stand from recliner required mod assist +2 and cues for hand placement     Balance Overall balance assessment: Needs assistance Sitting-balance support: Feet supported, Bilateral upper extremity supported Sitting balance-Leahy Scale: Fair Sitting balance - Comments: able to  perform dressing seated on EOB Postural control: Posterior lean Standing balance support: Bilateral upper extremity supported, During  functional activity, Reliant on assistive device for balance Standing balance-Leahy Scale: Poor Standing balance comment: reliant on RW for support                           ADL either performed or assessed with clinical judgement   ADL Overall ADL's : Needs assistance/impaired                 Upper Body Dressing : Supervision/safety;Sitting Upper Body Dressing Details (indicate cue type and reason): donned t-shirt seated on EOB Lower Body Dressing: Maximal assistance;Sitting/lateral leans;Sit to/from stand Lower Body Dressing Details (indicate cue type and reason): CAM boot donned at bed level. Patient participated in LB dressing seated on EOB with total assist to get items over feet and patient required max assist to pull up clothing Toilet Transfer: Minimal assistance;+2 for physical assistance;Ambulation;Rolling walker (2 wheels) Toilet Transfer Details (indicate cue type and reason): simulated to home environment           General ADL Comments: assist of one to stand from raised bed and mod assist +2 to stand from recliner    Extremity/Trunk Assessment              Vision       Perception     Praxis     Communication Communication Communication: No apparent difficulties   Cognition Arousal: Alert Behavior During Therapy: [facility] for tasks assessed/performed                                 Following commands: Impaired Following commands impaired: Follows one step commands with increased time      Cueing   Cueing Techniques: Verbal cues, Tactile cues  Exercises      Shoulder Instructions       General Comments BP seated on EOB 116/62, standing with abdominal binder 111/56, after ambulating 20 feet 110/59, after ambulating ~10 feet 125/48, 101/63 simulated bathroom transfer    Pertinent Vitals/ Pain       Pain Assessment Pain Assessment: Faces Faces Pain Scale: Hurts a little bit Pain Location: generalized Pain Descriptors  / Indicators: Discomfort, Grimacing Pain Intervention(s): Monitored during session, Repositioned  Home Living                                          Prior Functioning/Environment              Frequency  Min 1X/week        Progress Toward Goals  OT Goals(current goals can now be found in the care plan section)  Progress towards OT goals: Progressing toward goals  Acute Rehab OT Goals Patient Stated Goal: go home OT Goal Formulation: With patient Time For Goal Achievement: 05/01/23 Potential to Achieve Goals: Good ADL Goals Pt Will Perform Grooming: with set-up;sitting Pt Will Perform Lower Body Dressing: with min assist;with adaptive equipment;with caregiver independent in assisting;sitting/lateral leans;sit to/from stand Pt Will Transfer to Toilet: stand pivot transfer;with min assist;bedside commode Pt Will Perform Toileting - Clothing Manipulation and hygiene: with min assist;sitting/lateral leans;sit to/from stand Pt/caregiver will Perform Home Exercise Program: Increased strength;Both right and left upper extremity;With theraband;With written HEP provided Additional ADL Goal #1: Pt  will be Mod I in and OOB for basic ADLs  Plan      Co-evaluation    PT/OT/SLP Co-Evaluation/Treatment: Yes Reason for Co-Treatment: To address functional/ADL transfers;For patient/therapist safety;Other (comment) (activity tolerance) PT goals addressed during session: Mobility/safety with mobility;Proper use of DME;Balance OT goals addressed during session: ADL's and self-care      AM-PAC OT "6 Clicks" Daily Activity     Outcome Measure   Help from another person eating meals?: None Help from another person taking care of personal grooming?: A Little Help from another person toileting, which includes using toliet, bedpan, or urinal?: A Lot Help from another person bathing (including washing, rinsing, drying)?: A Lot Help from another person to put on and taking  off regular upper body clothing?: A Little Help from another person to put on and taking off regular lower body clothing?: A Lot 6 Click Score: 16    End of Session Equipment Utilized During Treatment: Gait belt;Rolling walker (2 wheels);Other (comment) (right CAM boot)  OT Visit Diagnosis: Unsteadiness on feet (R26.81);Other abnormalities of gait and mobility (R26.89);Muscle weakness (generalized) (M62.81);Pain   Activity Tolerance Patient tolerated treatment well   Patient Left in chair;with call bell/phone within reach;with chair alarm set   Nurse Communication Mobility status        Time: 1610-9604 OT Time Calculation (min): 73 min  Charges: OT General Charges $OT Visit: 1 Visit OT Treatments $Self Care/Home Management : 23-37 mins $Therapeutic Activity: 8-22 mins  Alfonse Flavors, OTA Acute Rehabilitation Services  Office [phone]   Dewain Penning 04/29/2023, 12:44 PM

## 2023-04-29 NOTE — Consult Note (Addendum)
 Cardiology Consultation   Patient ID: Harold [name] MRN: [id number]; DOB: [birth year]  Admit date: 03/27/2023 Date of Consult: 04/29/2023  PCP:  [name], MD   [facility] Providers Cardiologist:  [name], MD  Electrophysiologist:  [name], MD  PV Cardiologist:  Lorine Bears, MD    Patient Profile:   Harold [name] is a 68 y.o. male with a hx of CAD s/p remote angioplasty '03, DES to LAD/circumflex with staged PCI to RCA '14 PAD, carotid artery disease, OSA on CPAP, hypertension, persistent atrial fibrillation, HFpEF, hyperlipidemia, chronic venous insufficiency, diabetes who is being seen 04/29/2023 for the evaluation of bradycardia at the request of Dr. Dartha Lodge.  History of Present Illness:   [name] is a 68 year old male with past medical history noted above.  He has been followed by Dr. Herbie Baltimore as an outpatient as well as [name] for PAD.  Complex medical history with CAD and PAD with prior cardiac catheter for interventions.  Underwent balloon angioplasty of circumflex in 2003 with DES x 1 to LAD, DES x 2 to proximal/mid circumflex followed by staged PCI and DES x 3 to RCA in 2014 after he declined CABG.  Repeat cardiac catheterization 2015 showed stable disease with recommendations to continue medical therapy.  Underwent ischemic evaluation with Myoview 01/2022 which showed findings consistent with prior infarction but no reversible ischemia with LVEF of 36%.  Follow-up echocardiogram showed LVEF of 50 to 55% with global hypokinesis, mild LVH, normal RV and mild dilatation of the ascending aorta measuring 36 mm.  He has a known history of carotid artery and lower extremity disease.  Underwent right CEA in 2005.  Recent Dopplers 06/2022 with 40-59% stenosis of left ICA and 1-39% stenosis of right ICA as well as bilateral systolic deceleration noted in bilateral vertebral arteries.  ABIs in 2021 were suggestive of moderate disease on the  right and mild on the left.  Underwent peripheral angiogram which showed nonobstructive disease and iliac arteries as well as significant calcified stenosis of the TP trunk on the right with an occluded proximal anterior tibial artery and significant disease in the proximal tibial artery.  Underwent successful balloon angioplasty of the right TP trunk into the posterior tibial artery followed by drug-coated balloon angioplasty of the TP trunk.  Diagnosed with atrial fibrillation 01/2022 and his plavix was stopped, placed on Eliquis. Wore cardiac monitor showing continuous atrial fibrillation with NSVT. Referred to the afib clinic and underwent DCCV 02/2022. Seen by Dr. Nelly Laurence and placed on amiodarone 06/2022.   Underwent successful orbital artherectomy and drug-coated balloon  angioplasty of the left prox posterior tibial artery. Placed on plavix and Eliquis x`1 month.   He presented to the ED on 2/6 with complaints of cough, fever and generalized weakness. Found to have flu A, and right leg cellulitis. Found to have bacteremia from right leg wound/osteomyelitis. Underwent right ankle hardware removal, I&D/wound VAC placement on 2/10 by Dr. Jena Gauss. Repeat debridement 2/12. ID following. Right sided facial droop on 3/1, evaluated by neurology with MRI negative. Developed worsening renal failure, nephrology following and now on HD after [facility] placed on 3/4.    Cardiology consulted 3/11 in the setting of bradycardia with hypotension.  In review of patient's telemetry he has persistently been bradycardic with heart rates mostly in the 50s the majority of his admission.  States that he has episodes lightheadedness/dizziness periodically mostly with position changes.  Blood pressure noted to be soft in the low 100s systolic  this afternoon.  Overall he denies any other complaints at the time of interview.  He has been maintained on carvedilol 3.125 mg twice daily as well as amiodarone 200 mg twice daily.  [name] is  68 year old gentleman with complex medical history is well-known to me after over 10 years being his cardiologist.  He has been in the hospital for prolonged time with multiple different issues during this hospitalization.  His cardiac history includes multivessel CAD with multivessel PCI as opposed to CABG, PAD and relatively recently diagnosed PAF with HFpEF and therefore rhythm control with amiodarone.  He has had a complex hospitalization described above.  We are called because of bradycardia issues but also near syncopal episodes.  He described an episode today for instance after working with PT getting back to his room he began feeling very lightheaded and dizzy and felt as though he might pass out prior to meals it down.  He says he has had episodes like that several times in the past several months where he felt as though he may have actually passed out briefly but has not had any falls or injuries.  He has not had any suggestion of any rapid irregular heartbeats.  No chest pain pressure with rest or exertion and for the most part has had no PND, orthopnea to go along with his longstanding lower extremity edema with venous stasis changes and PAD.   Past Medical History:  Diagnosis Date   Anemia    Anginal pain (HCC) 04/2013   Cardiac cath showed patent stents with distal LAD, circumflex-OM and RCA disease in small vessels.   Anxiety    Atrial fibrillation (HCC) 12/2021   CAD S/P percutaneous coronary angioplasty 2003, 04/2012   status post PCI to LAD, circumflex-OM 2, RCA   Carotid artery occlusion    Chronic renal insufficiency, stage II (mild)    Chronic venous insufficiency    varicosities, no reflux; dopplers 04/14/12- valvular insufficiency in the R and L GSV   Complication of anesthesia    COPD, mild (HCC)    Depression    situaltional    Diabetes mellitus    Diabetic neuropathy (HCC) 08/24/2019   Dyslipidemia associated with type 2 diabetes mellitus (HCC)    Fall at home, initial  encounter 01/02/2023   GERD (gastroesophageal reflux disease)    History of cardioversion 12/2021   at [facility]   HTN (hypertension)    Hypothyroidism    Neuromuscular disorder (HCC)    neuropathy in feet   Neuropathy    notably improved following PCI with improved cardiac function   Obesity    Obesity, Class II, BMI 35.0-39.9, with comorbidity (see actual BMI)    BMI 39; wgt loss efforts in place; seeing Dietician   Open ankle fracture 01/02/2023   Orthostatic hypotension    PONV (postoperative nausea and vomiting)    "[employer]"   Pulmonary hypertension (HCC) 04/2012   PA pressure   Sleep apnea    uses nightly   Vitamin D deficiency    per facility notes    Past Surgical History:  Procedure Laterality Date   ABDOMINAL AORTAGRAM N/A 11/15/2011   Procedure: ABDOMINAL Ronny Flurry;  Surgeon: Sherren Kerns, MD;  Location: [facility] CATH LAB;  Service: Cardiovascular;  [name];   ABDOMINAL AORTOGRAM W/LOWER EXTREMITY N/A 10/13/2019   Procedure: ABDOMINAL AORTOGRAM W/LOWER EXTREMITY;  Surgeon: Iran Ouch, MD;  Location: [facility] INVASIVE CV LAB;  Service: CV: Non-obst Ao-Iliac. R SFA-PopA patent with significant  calcific stenosis of TP trunk-prox PTA (dominant) & CTO ATA  -> R PTA-TP PTCA   ABDOMINAL AORTOGRAM W/LOWER EXTREMITY N/A 11/06/2022   Procedure: ABDOMINAL AORTOGRAM W/LOWER EXTREMITY;  Surgeon: Iran Ouch, MD;  Location: [facility] INVASIVE CV LAB;  Service: Cardiovascular;  [name];   ABDOMINAL AORTOGRAM W/LOWER EXTREMITY Right 04/01/2023   Procedure: ABDOMINAL AORTOGRAM W/LOWER EXTREMITY;  Surgeon: Nada Libman, MD;  Location: [facility] INVASIVE CV LAB;  Service: Cardiovascular;  Laterality: Right;   ABIs  04/27/2012   mild bilateral arterial insufficiency   APPLICATION OF WOUND VAC Right 03/31/2023   Procedure: APPLICATION OF WOUND VAC;  Surgeon: Roby Lofts, MD;  [name];  Service: Orthopedics;  Laterality: Right;   BACK SURGERY  2005 x1    X2-2010   BUNIONECTOMY Right 11/11/2013   Procedure: RIGHT FOOT SILVER BUNIONECTOMY;  Surgeon: Toni Arthurs, MD;  Location: [facility];  Service: Orthopedics;  Laterality: Right;   CARDIAC CATHETERIZATION  12/11/2001   significant 3V CAD, normal LV function   CARDIOVERSION N/A 03/13/2022   Procedure: CARDIOVERSION;  Surgeon: Little Ishikawa, MD;  Location: [facility] ENDOSCOPY;  Service: Cardiovascular;  [name];   CARDIOVERSION N/A 07/25/2022   Procedure: CARDIOVERSION;  Surgeon: Jake Bathe, MD;  [facility]: [facility] INVAS[facility] CV LAB;  Service: Cardiovascular;  [name];   Carotid Doppler  04/27/2012   right internal carotid: Elevated velocities but no evidence of plaque. Left internal carotid 40-59%   CAROTID ENDARTERECTOMY  2005   Right; recent carotid Dopplers notes elevated velocities.    colonscopy     CORONARY ANGIOPLASTY  12/21/2001   PTCA of the distal and mid AV groove circ, unsuccessful PTCA of second OM total occlusion, unsuccessful PTCA of the apical LAD total occlusion   CORONARY ANGIOPLASTY WITH STENT PLACEMENT  04/29/2012   PCI to 3 RCA lesions, Promus Premiere 2.74mmx8mm distally, mid was 2.62mx28mm and proximally 2.75x50mm, EF 55-60%   CORONARY STENT PLACEMENT  04/28/2012   PCI to LAD (3x31mm Xience DES postdilated to 3.25) and circ prox and mid (2 overlappinmg 2.29mmx12mmXience DES postdilated to 2.47mm)   DOPPLER ECHOCARDIOGRAPHY  04/28/2012   poor quality study: EF estimated 60-65%; unable to assess diastolic function (previously noted to have diastolic dysfunction); severely dilated left atrium and mild right atrium; dilated IVC consistent with increased central venous pressure.Marland Kitchen   HARDWARE REMOVAL Right 03/31/2023   Procedure: HARDWARE REMOVAL RIGHT ANKLE;  Surgeon: Roby Lofts, MD;  [name];  Service: Orthopedics;  Laterality: Right;   HARDWARE REMOVAL Right 01/28/2023   Procedure: REMOVAL OF HARDWARE RIGHT ANKLE;  Surgeon: [name], MD;  [name];  Service: Orthopedics;  Laterality: Right;   HERNIA REPAIR     I & D EXTREMITY Right 01/02/2023   Procedure: IRRIGATION AND DEBRIDEMENT RIGHT ANKLE;  Surgeon: [name], MD;  [name];  Service: Orthopedics;  Laterality: Right;   I & D EXTREMITY Right 04/02/2023   Procedure: DEBRIDEMENT RIGHT ANKLE;  Surgeon: Nadara Mustard, MD;  Location: [facility] OR;  Service: Orthopedics;  Laterality: Right;   IR FLUORO GUIDE CV LINE LEFT  04/22/2023   IR FLUORO GUIDE CV LINE RIGHT  04/07/2023   IR PATIENT EVAL TECH 0-60 MINS  04/07/2023   IR RADIOLOGIST EVAL & MGMT  04/08/2023   IR US GUIDE VASC ACCESS LEFT  04/22/2023   IR US GUIDE VASC ACCESS RIGHT  04/07/2023   LEFT AND RIGHT HEART CATHETERIZATION WITH CORONARY ANGIOGRAM N/A 04/27/2012  Procedure: LEFT AND RIGHT HEART CATHETERIZATION WITH CORONARY ANGIOGRAM;  Surgeon: Marykay Lex, MD;  Location: [facility] CATH LAB;  Service: Cardiovascular;  [name];   LEFT AND RIGHT HEART CATHETERIZATION WITH CORONARY ANGIOGRAM N/A 05/14/2013   Procedure: LEFT AND RIGHT HEART CATHETERIZATION WITH CORONARY ANGIOGRAM;  Surgeon: Lennette Bihari, MD;  [name]; PCWP ;; [name], [name], Patent RCA ostial mid and distal stents - 70% distal runoff Dz   LUMBAR LAMINECTOMY/DECOMPRESSION MICRODISCECTOMY  01/07/2012   Procedure: LUMBAR LAMINECTOMY/DECOMPRESSION MICRODISCECTOMY 1 LEVEL;  Surgeon: Carmela Hurt, MD;  [name];  Service: Neurosurgery;  Laterality: Bilateral;   Lumbar Three-Four Decompression   LUMBAR LAMINECTOMY/DECOMPRESSION MICRODISCECTOMY N/A 07/26/2020   Procedure: Lumbar two-three Laminectomy/Foraminotomy;  Surgeon: Coletta Memos, MD;  [name];  Service: Neurosurgery;  [name];   ORIF ANKLE FRACTURE Right 01/02/2023   Procedure: OPEN REDUCTION  INTERNAL FIXATION (ORIF)RIGHT ANKLE FRACTURE;  Surgeon: [name], MD;  [name];  Service: Orthopedics;  Laterality: Right;   PERCUTANEOUS CORONARY STENT INTERVENTION (PCI-S) N/A 04/28/2012   Procedure: PERCUTANEOUS CORONARY STENT INTERVENTION (PCI-S);  Surgeon: Lennette Bihari, MD;  Location: [facility] CATH LAB;  Service: Cardiovascular;  [name];   PERCUTANEOUS CORONARY STENT INTERVENTION (PCI-S) N/A 04/29/2012   Procedure: PERCUTANEOUS CORONARY STENT INTERVENTION (PCI-S);  Surgeon: Marykay Lex, MD;  Location: [facility] CATH LAB;  Service: Cardiovascular;  [name];   PERIPHERAL VASCULAR ATHERECTOMY  11/06/2022   Procedure: PERIPHERAL VASCULAR ATHERECTOMY;  Surgeon: Iran Ouch, MD;  [facility] LAB;  Service: Cardiovascular;;   PERIPHERAL VASCULAR BALLOON ANGIOPLASTY Right 10/13/2019   Procedure: PERIPHERAL VASCULAR BALLOON ANGIOPLASTY;  Surgeon: Iran Ouch, MD;  [facility] CV LAB;  Service: Cardiovascular;  Laterality: Right;  PTCA of R TP Trunk into PTA (Drug-coated) => Follow-up ABIs 0.9 on the right and 0.99 on the left.   PERIPHERAL VASCULAR BALLOON ANGIOPLASTY Right 04/01/2023   Procedure: PERIPHERAL VASCULAR BALLOON ANGIOPLASTY;  Surgeon: Nada Libman, MD;  [facility] INVAS[facility] CV LAB;  Service: Cardiovascular;  Laterality: Right;   PERIPHERAL VASCULAR INTERVENTION Right 04/01/2023   Procedure: PERIPHERAL VASCULAR INTERVENTION;  Surgeon: Nada Libman, MD;  [facility]: [facility] INVAS[facility] CV LAB;  Service: Cardiovascular;  Laterality: Right;   SPINE SURGERY     UMBILICAL HERNIA REPAIR  2009   steel mesh insert     Inpatient Medications: Scheduled Meds:  allopurinol  50 mg Oral Daily   amiodarone  200 mg Oral BID   apixaban  5 mg Oral BID   atorvastatin  40 mg Oral Daily   carvedilol  3.125 mg Oral BID WC   Chlorhexidine Gluconate Cloth  6 each Topical Q0600   [START ON 04/30/2023] Chlorhexidine Gluconate Cloth  6 each Topical Q0600    clopidogrel  75 mg Oral Q breakfast   docusate sodium  100 mg Oral BID   DULoxetine  60 mg Oral Daily   ezetimibe  10 mg Oral Daily   gabapentin  300 mg Oral QHS   guaiFENesin  600 mg Oral BID   insulin aspart  0-9 Units Subcutaneous Q6H   insulin glargine  40 Units Subcutaneous Daily   levothyroxine  88 mcg Oral QAC breakfast   polyethylene glycol  17 g Oral Daily   sodium chloride flush  3 mL Intravenous Q12H  sodium chloride flush  3-10 mL Intravenous Q12H   tamsulosin  0.4 mg Oral Daily   Continuous Infusions:  PRN Meds: acetaminophen **OR** acetaminophen, calcium carbonate, hydrALAZINE, melatonin, methocarbamol **OR** methocarbamol (ROBAXIN) injection, metoCLOPramide **OR** metoCLOPramide (REGLAN) injection, naLOXone (NARCAN)  injection, ondansetron (ZOFRAN) IV, sodium chloride flush, sodium chloride flush  Allergies:    Allergies  Allergen Reactions   Doxycycline Hives and Rash   Septra [Bactrim] Itching   Penicillins Rash    Allergy to All cillin drugs  Ancef given 9/24 with no obvious reaction   Metformin And Related Diarrhea and Nausea Only   Other Nausea And Vomiting    General Anesthesia - sometimes causes nausea and vomiting * OK to use scopolamine patch     Sulfamethoxazole-Trimethoprim Other (See Comments)   Wellbutrin [Bupropion]     Mood Changes    Social History:   Social History   Socioeconomic History   Marital status: Married    Spouse name: Not on file   Number of children: 0   Years of education: hs   Highest education level: Not on file  Occupational History   Occupation: Surveyor, quantity: replacement limited  Tobacco Use   Smoking status: Former    Current packs/day: 0.00    Average packs/day: 1 pack/day for 42.0 years (42.0 ttl pk-yrs)    Types: Cigarettes    Start date: 03/22/1961    Quit date: 03/23/2003    Years since quitting: 20.1   Smokeless tobacco: Never   Tobacco comments:    Former smoker 02/19/22  Vaping Use    Vaping status: Never Used  Substance and Sexual Activity   Alcohol use: No    Alcohol/week: 0.0 standard drinks of alcohol   Drug use: No   Sexual activity: Not on file  Other Topics Concern   Not on file  Social History Narrative   He and his partner [name] have now officially gotten married on 05/26/2013.  [name] is also a patient of our clinic.  He is now initially hyphenated his last name.   He has now " retired"from his long-term employment at [employer]. ->  As of September 2020   He does not smoke, he quit in 2009.  He does not drink alcohol.   He was adopted and no family history is known.    Social Drivers of Corporate investment banker Strain: Not on file  Food Insecurity: No Food Insecurity (03/27/2023)   Hunger Vital Sign    Worried About Running Out of Food in the Last Year: Never true    Ran Out of Food in the Last Year: Never true  Transportation Needs: No Transportation Needs (03/27/2023)   PRAPARE - Administrator, Civil Service (Medical): No    Lack of Transportation (Non-Medical): No  Physical Activity: Not on file  Stress: Not on file  Social Connections: Unknown (03/28/2023)   Social Connection and Isolation Panel [NHANES]    Frequency of Communication with Friends and Family: More than three times a week    Frequency of Social Gatherings with Friends and Family: More than three times a week    Attends Religious Services: Not on file    Active Member of Clubs or Organizations: Yes    Attends Banker Meetings: More than 4 times per year    Marital Status: Married  Catering manager Violence: Not At Risk (03/27/2023)   Humiliation, Afraid, Rape, and Kick questionnaire    Fear  of Current or Ex-Partner: No    Emotionally Abused: No    Physically Abused: No    Sexually Abused: No    Family History:    Family History  Adopted: Yes  Family history unknown: Yes     ROS:  Please see the history of present illness.   All other  ROS reviewed and negative.     Physical Exam/Data:   Vitals:   04/29/23 0500 04/29/23 1050 04/29/23 1204 04/29/23 1250  BP: (!) 161/63 (!) 135/55 101/63 (!) 103/52  Pulse:  (!) 52    Resp: 18 17 18    Temp: 98.4 F (36.9 C) 98.8 F (37.1 C) 98.4 F (36.9 C)   TempSrc:  Axillary Oral   SpO2:  97%  96%  Weight: (!) 138 kg     Height:        Intake/Output Summary (Last 24 hours) at 04/29/2023 1604 Last data filed at 04/29/2023 0500 Gross per 24 hour  Intake --  Output 1800 ml  Net -1800 ml      04/29/2023    5:00 AM 04/28/2023    5:00 AM 04/27/2023    4:15 AM  Last 3 Weights  Weight (lbs) 304 lb 3.8 oz 304 lb 7.3 oz 299 lb 9.7 oz  Weight (kg) 138 kg 138.1 kg 135.9 kg     Body mass index is 40.14 kg/m.  General:  Well nourished, obese male; seems a little bit ill-appearing and somewhat fatigued not abnormal likely from being in the hospital for long time Neck: no JVD Vascular: No carotid bruits; Distal pulses 2+ bilaterally Cardiac:  RRR; distant but normal heart S1, S2; no murmur  Lungs:  clear to auscultation bilaterally, no wheezing, rhonchi or rales  Abd: soft, nontender, no hepatomegaly  Ext: Right foot with dressing in boot Musculoskeletal:  No deformities, BUE and BLE strength normal and equal Skin: warm and dry  Neuro:  CNs 2-12 intact, no focal abnormalities noted Psych:  Normal affect   EKG:  The EKG was personally reviewed and demonstrates: 2/14, sinus rhythm, 68 bpm, specific changes Telemetry:  Telemetry was personally reviewed and demonstrates: Sinus bradycardia, mostly in the 50s  Relevant CV Studies:  Echo: 04/16/2023: EF 55 to 60%.  Basal inferior hypokinesis and mildly dilated LV.  GR 2 DD.  Mildly enlarged RV with mildly increased PAP.  Moderate LA dilation, mild RA dilation.  Baby sclerosis with no stenosis.  Normal RAP.   Laboratory Data:  High Sensitivity Troponin:  No results for input(s): "TROPONINIHS" in the last 720 hours.   Chemistry Recent  Labs  Lab 04/27/23 2145 04/28/23 0530 04/29/23 1015  NA 131* 134* 136  K 3.9 3.8 3.8  CL 96* 100 100  CO2 19* 21* 23  GLUCOSE 257* 177* 159*  BUN 69* 70* 74*  CREATININE 9.02* 9.06* 9.31*  CALCIUM 8.8* 8.5* 8.9  MG 2.1  --   --   GFRNONAA 6* 6* 6*  ANIONGAP 16* 13 13    Recent Labs  Lab 04/27/23 0530 04/28/23 0530 04/29/23 1015  ALBUMIN 2.3* 2.4* 2.5*   Lipids No results for input(s): "CHOL", "TRIG", "HDL", "LABVLDL", "LDLCALC", "CHOLHDL" in the last 168 hours.  Hematology Recent Labs  Lab 04/26/23 0705 04/27/23 0530 04/28/23 0530  WBC 6.5 6.5 6.2  RBC 2.65* 2.59* 2.69*  HGB 7.9* 7.7* 7.8*  HCT 22.7* 22.3* 23.3*  MCV 85.7 86.1 86.6  MCH 29.8 29.7 29.0  MCHC 34.8 34.5 33.5  RDW 15.9* 15.8* 15.8*  PLT 171 172 181   Thyroid No results for input(s): "TSH", "FREET4" in the last 168 hours.  BNPNo results for input(s): "BNP", "PROBNP" in the last 168 hours.  DDimer No results for input(s): "DDIMER" in the last 168 hours.   Radiology/Studies:  CT HEAD WO CONTRAST ( ) Result Date: 04/29/2023 CLINICAL DATA:  68 year old male with vertigo. EXAM: CT HEAD WITHOUT CONTRAST TECHNIQUE: Contiguous axial images were obtained from the base of the skull through the vertex without intravenous contrast. RADIATION DOSE REDUCTION: This exam was performed according to the departmental dose-optimization program which includes automated exposure control, adjustment of the mA and/or kV according to patient size and/or use of iterative reconstruction technique. COMPARISON:  Brain MRI and head CT 04/19/2023 FINDINGS: Brain: No midline shift, ventriculomegaly, mass effect, evidence of mass lesion, intracranial hemorrhage or evidence of cortically based acute infarction. Gray-white differentiation stable and within normal limits for age. Vascular: No suspicious intracranial vascular hyperdensity. Extensive calcified atherosclerosis at the skull base. Skull: No acute osseous abnormality identified.  Partially visible advanced upper cervical spine degeneration. Sinuses/Orbits: Visualized paranasal sinuses and mastoids are stable and well aerated. Tympanic cavities appear clear. Other: Stable orbit and scalp soft tissues. Some calcified scalp vessel atherosclerosis. IMPRESSION: 1. Stable and negative for age noncontrast CT appearance of the Brain. 2. Advanced calcified atherosclerosis. Electronically Signed   By: Odessa Fleming M.D.   On: 04/29/2023 15:41     Assessment and Plan:   DAAIEL STARLIN is a 68 y.o. male with a hx of CAD s/p remote angioplasty '03, DES to LAD/circumflex with staged PCI to RCA '14 PAD, carotid artery disease, OSA on CPAP, hypertension, persistent atrial fibrillation, HFpEF, hyperlipidemia, chronic venous insufficiency, diabetes who is being seen 04/29/2023 for the evaluation of bradycardia at the request of Dr. Dartha Lodge.  Bradycardia Hypotension -- In review of chart patient has been bradycardic with heart rates in the 50s majority of his admission.  Does have notable episodes of soft blood pressures periodically.  He describes episodes of dizziness and lightheadedness with position changes.  Suspect symptoms are more so related to soft blood pressures as opposed to his bradycardia. -- No evidence of high degree AV block noted on telemetry -- Reasonable to discontinue carvedilol, would continue amiodarone 200 mg twice daily for now I do not suspect that his dizziness episodes are related to bradycardia as he had no evidence of any pauses or significant bradycardia on telemetry while he had an episode earlier today.  However it is reasonable to stop carvedilol to allow for additional blood pressure  Persistent atrial fibrillation -plan is rhythm control amiodarone and Eliquis for stroke prevention -- currently sinus bradycardia -- as above, would stop coreg and continue amiodarone for now -- on Eliquis 5mg  BID  PAD -- Followed by [name] as an outpatient recent  -- Seen  by [facility] and underwent successful stenting of the right adductor canal on 2/11 -- on plavix, statin  Right lower leg wound infection/osteomyelitis Bacteremia -- Status post right ankle hardware removal, I&D and wound VAC 2/10, repeat debridement 2/12 -- Seen by ID  CKD stage IIIa with AKI, ATN -- Initial creatinine on admission 2.7 up to 9.31  -- s/p TDC with 2 HD sessions completed thus far -- Nephrology following  Per primary DM HTN Anemia Flu A Hypothyroidism Gout Obesity  Risk Assessment/Risk Scores:    CHA2DS2-VASc Score = 4   This indicates a 4.8% annual risk of stroke. The patient's score is based upon: CHF  History: 0 HTN History: 1 Diabetes History: 1 Stroke History: 0 Vascular Disease History: 1 Age Score: 1 Gender Score: 0    For questions or updates, please contact [facility] Please consult [email or web address] for contact info under    Signed, Harold Page, NP  04/29/2023 4:04 PM   ATTENDING ATTESTATION  I have seen, examined and evaluated the patient this afternoon in consultation along with Harold Page, NP.  After reviewing all the available data and chart, we discussed the patients laboratory, study & physical findings as well as symptoms in detail.  I agree with her findings, examination as well as impression recommendations as per our discussion.    Attending adjustments noted in italics. ..  Despite having a prolonged hospitalization with multiple different issues, he has been pretty stable overall for cardiac standpoint.  No anginal symptoms, minimal heart failure symptoms other than swelling which is likely more due to venous stasis.  No signs or symptoms of A-fib.  He has had mostly sinus rhythm and sinus bradycardia on telemetry.  He has had these near syncopal episodes but did not correlate with any arrhythmias on telemetry.  Recommendations TAVR to stop carvedilol continue other medications.  Rhythm control with amiodarone as he  was not very tolerant of being in A-fib.  Continue DOAC.  On antiplatelet agent for peripheral stents per [facility]..  WILL FOLLOW PERIPHERALLY & BE AVAILABLE IF QUESTIONS ARISE.    Marykay Lex, MD, MS [name], M.D., M.S. Interventional Cardiologist  [facility]  Pager # [phone] Phone # [phone] [address] [place], [place] [address]

## 2023-04-29 NOTE — Progress Notes (Addendum)
 [facility] NEPHROLOGY PROGRESS NOTE  Assessment/ Plan: Pt is a 68 y.o. yo male    # Dialysis dependent AKI on CKD 3; ATN and AIN both possible. -CLIP as AKI to [place] start 3/11 if needed. -Noted urine output is picking up however both BUN and serum creatinine level is trending up. Vol still up on exam. I think it is safe for him to do dialysis at this time with close monitoring of renal recovery as outpatient.  We will plan to do HD today and work on getting him discharge to outpatient soon.  # Hypertension with tendency towards orthostasis on low-dose carvedilol and tamsulosin.  # Nephrotic range proteinuria with negative serological evaluation but decision against biopsy at the current time per previous notes.  # Right lower extremity infection with involvement of orthopedic hardware status post removal and antibiotics discontinued 2/28 with concern for AIN.  #Chronic anemia, hemoglobin in the 7s.  #Mild hyponatremia, continue to limit free water intake.  HD today.  # Increased anion gap metabolic acidosis, managed with dialysis.  Subjective: Seen and examined at bedside.  With IV Lasix the urine output did increase to 1.8 L however both BUN and creatinine level trending.  Creatinine level around 9 and BUN in 70s.  Discussed that during dialysis.  Denies nausea, vomiting.  Chest pain.  He has baseline shortness of breath.  Objective Vital signs in last 24 hours: Vitals:   04/29/23 0500 04/29/23 1050 04/29/23 1204 04/29/23 1250  BP: (!) 161/63 (!) 135/55 101/63 (!) 103/52  Pulse:  (!) 52    Resp: 18 17 18    Temp: 98.4 F (36.9 C) 98.8 F (37.1 C) 98.4 F (36.9 C)   TempSrc:  Axillary Oral   SpO2:  97%  96%  Weight: (!) 138 kg     Height:       Weight change: -0.1 kg  Intake/Output Summary (Last 24 hours) at 04/29/2023 1356 Last data filed at 04/29/2023 0500 Gross per 24 hour  Intake --  Output 1800 ml  Net -1800 ml       Labs: RENAL PANEL Recent  Labs  Lab 04/25/23 0442 04/26/23 0705 04/27/23 0530 04/27/23 2145 04/28/23 0530 04/29/23 1015  NA 130* 134* 132* 131* 134* 136  K 3.5 3.5 3.6 3.9 3.8 3.8  CL 99 98 100 96* 100 100  CO2 22 24 21* 19* 21* 23  GLUCOSE 196* 137* 193* 257* 177* 159*  BUN 64* 63* 63* 69* 70* 74*  CREATININE 7.16* 8.00* 8.69* 9.02* 9.06* 9.31*  CALCIUM 8.0* 8.7* 8.4* 8.8* 8.5* 8.9  MG  --   --   --  2.1  --   --   PHOS 4.6 4.9* 5.3*  --  5.6* 5.9*  ALBUMIN 2.3* 2.3* 2.3*  --  2.4* 2.5*    Liver Function Tests: Recent Labs  Lab 04/27/23 0530 04/28/23 0530 04/29/23 1015  ALBUMIN 2.3* 2.4* 2.5*   No results for input(s): "LIPASE", "AMYLASE" in the last 168 hours. No results for input(s): "AMMONIA" in the last 168 hours. CBC: Recent Labs    03/28/23 0810 03/29/23 0407 04/24/23 0400 04/25/23 0442 04/26/23 0705 04/27/23 0530 04/28/23 0530  HGB  --    < > 7.8* 8.0* 7.9* 7.7* 7.8*  MCV  --    < > 84.9 85.0 85.7 86.1 86.6  VITAMINB12 305  --   --   --   --   --   --    < > =  values in this interval not displayed.    Cardiac Enzymes: Recent Labs  Lab 04/25/23 0442  CKTOTAL 187   CBG: Recent Labs  Lab 04/28/23 1036 04/28/23 1725 04/29/23 0013 04/29/23 0531 04/29/23 1204  GLUCAP 179* 246* 154* 134* 138*    Iron Studies: No results for input(s): "IRON", "TIBC", "TRANSFERRIN", "FERRITIN" in the last 72 hours. Studies/Results: No results found.  Medications: Infusions:    Scheduled Medications:  allopurinol  50 mg Oral Daily   amiodarone  200 mg Oral BID   apixaban  5 mg Oral BID   atorvastatin  40 mg Oral Daily   carvedilol  3.125 mg Oral BID WC   Chlorhexidine Gluconate Cloth  6 each Topical Q0600   clopidogrel  75 mg Oral Q breakfast   docusate sodium  100 mg Oral BID   DULoxetine  60 mg Oral Daily   ezetimibe  10 mg Oral Daily   gabapentin  300 mg Oral QHS   guaiFENesin  600 mg Oral BID   insulin aspart  0-9 Units Subcutaneous Q6H   insulin glargine  40 Units  Subcutaneous Daily   levothyroxine  88 mcg Oral QAC breakfast   polyethylene glycol  17 g Oral Daily   sodium chloride flush  3 mL Intravenous Q12H   sodium chloride flush  3-10 mL Intravenous Q12H   tamsulosin  0.4 mg Oral Daily    have reviewed scheduled and prn medications.  Physical Exam: General:NAD, comfortable Heart:RRR, s1s2 nl Lungs: Reduced breath sound bilateral basal Abdomen:soft, Non-tender, distended Extremities:b/l leg edema++, dependent edema present Dialysis Access: TDC in place  Arjun Hard Prasad Julaine Zimny 04/29/2023,1:56 PM  LOS: 33 days

## 2023-04-29 NOTE — Progress Notes (Signed)
 Physical Therapy Treatment Patient Details Name: Harold [name] MRN: [id number] DOB: Nov 17, 1955 Today's Date: 04/29/2023   History of Present Illness 68 y.o. male presents to [facility] 03/27/23 with productive cough and generalized weakness. Admitted with influenza A and cellulitis of R foot s/p hardware removal of the R ankle and application of wound vac on 2/10, wound vac removed 2/27. Left thigh pain, CT negative. Prior admit 11/24 for R ankle ORIF and in 12/24 for revision. S/p excisional debridement of right ankle on 2/12. Pt underwent central line placement on 04/07/2023 however he has been having issues with recurrent bleeding from the insertion site which has finally stopped. 04/17/23 pt with drop in sats requiring 2 L of O2 via [place], chest xray concerning for edema or possibly pneumonia.Significant PMH: HTN, HLD, CAD s/p PCI, COPD, DM2, CKD stage IIIb, PVD, gout, OSA, neuropathy, COPD, a-fib, CAD s/p percutaneous coronary angioplasty, angina, obesity    PT Comments  [facility] PT/OT Session: Focus of session on determining the appropriate level of mobility to be able to go home with husband. Husband was on phone for discussion. It in now clear that husband is there except for about 2-3 hours a week, and he will be taking pt to and from dialysis. Prior goal was for ambulation of 40 feet for pt to be able to ambulate from his bed or recliner to the bathroom. However, pt is unable to ambulate those distances yet due to fatigue and weakness. PT/OT discussed that he will need to use a BSC, because that is a perfectly safe alternative. Pt continues to struggle with ambulation due to weakness and fatigue. Pt has ambulated a maximum of 20 feet with min-modA and close chair follow.  Pt continues to have low BP and low HR with activity and being upright. (See OT note for details). Plan for discharge after pt's husband returns from out of town trip on Friday. Will continue to progress mobility as able until discharge.     If plan is discharge home, recommend the following: A lot of help with walking and/or transfers;A lot of help with bathing/dressing/bathroom;Assistance with cooking/housework;Assist for transportation;Help with stairs or ramp for entrance   Can travel by private vehicle     Yes  Equipment Recommendations  BSC/3in1 (bariatric)       Precautions / Restrictions Precautions Precautions: Fall Recall of Precautions/Restrictions: Impaired Precaution/Restrictions Comments: Watch BP Required Braces or Orthoses: Other Brace Other Brace: R CAM boot for ambulation, R splint in bed, abdominal binder (Prn) Restrictions Weight Bearing Restrictions Per Provider Order: Yes RLE Weight Bearing Per Provider Order: Weight bearing as tolerated Other Position/Activity Restrictions: per Dr. Audrie Lia note 04/03/2023 and orders 04/03/2023 pt WBAT     Mobility  Bed Mobility Overal bed mobility: Needs Assistance Bed Mobility: Supine to Sit     Supine to sit: Contact guard, HOB elevated, Used rails     General bed mobility comments: increased time and use of bed features    Transfers Overall transfer level: Needs assistance Equipment used: Rolling walker (2 wheels) Transfers: Sit to/from Stand, Bed to chair/wheelchair/BSC Sit to Stand: Min assist, Mod assist, +2 physical assistance           General transfer comment: min assist of one from raised bed for sit to stand and patient able to perform step pivot transfer to recliner. Sit to stand from recliner required mod assist +2 and cues for hand placement, increasing assist needed due to fatigue    Ambulation/Gait Ambulation/Gait assistance: Min assist,  Mod assist Gait Distance (Feet): 20 Feet (+10' and 4 feet sidestepping to approximate how pt gets in and out of his bathroom) Assistive device: Rolling walker (2 wheels) Gait Pattern/deviations: Step-through pattern, Trunk flexed, Decreased step length - right, Decreased step length - left Gait  velocity: reduced Gait velocity interpretation: <1.31 ft/sec, indicative of household ambulator   General Gait Details: min A with initial bout of ambulation of 20 feet, vc for increased R knee flexion to facilitate floor clearance of CAM boot during swing phase, min A for additional ambulation of 10 feet after seated rest break. Pt reports he is too weak to go further. After seated rest break pt able to simulate sidestepping needed to get into his bathroom at home.      Balance Overall balance assessment: Needs assistance Sitting-balance support: Feet supported, Bilateral upper extremity supported Sitting balance-Leahy Scale: Fair Sitting balance - Comments: able to perform dressing seated on EOB Postural control: Posterior lean Standing balance support: Bilateral upper extremity supported, During functional activity, Reliant on assistive device for balance Standing balance-Leahy Scale: Poor Standing balance comment: reliant on RW for support                            Communication Communication Communication: No apparent difficulties  Cognition Arousal: Alert Behavior During Therapy: WFL for tasks assessed/performed   PT - Cognitive impairments: Safety/Judgement                       PT - Cognition Comments: continues to have poor safety awareness, and acceptance of impairments Following commands: Impaired Following commands impaired: Follows one step commands with increased time    Cueing Cueing Techniques: Verbal cues, Tactile cues     General Comments General comments (skin integrity, edema, etc.): Extensive conversation with pt and his husband about mobility needed to navigate in his home and make it to and from HD. Pt preference is to be able to walk 21ft into bathroom however pt is not currently able to complete, However, pt can place [facility] beside bed or recliner and make pivot transfers safely.  HR in 40s with upright positioning and activity       Pertinent Vitals/Pain Pain Assessment Pain Assessment: Faces Faces Pain Scale: Hurts a little bit Pain Location: generalized Pain Descriptors / Indicators: Discomfort, Grimacing Pain Intervention(s): Monitored during session, Limited activity within patient's tolerance    Home Living                              PT Goals (current goals can now be found in the care plan section) Acute Rehab PT Goals PT Goal Formulation: With patient Time For Goal Achievement: 05/01/23 Potential to Achieve Goals: Fair Progress towards PT goals: Progressing toward goals    Frequency    Min 1X/week           Co-evaluation PT/OT/SLP Co-Evaluation/Treatment: Yes Reason for Co-Treatment: To address functional/ADL transfers;For patient/therapist safety;Other (comment) (activity tolerance) PT goals addressed during session: Mobility/safety with mobility;Proper use of DME;Balance OT goals addressed during session: ADL's and self-care      AM-PAC PT "6 Clicks" Mobility   Outcome Measure  Help needed turning from your back to your side while in a flat bed without using bedrails?: A Little Help needed moving from lying on your back to sitting on the side of a flat bed without using bedrails?: A  Little Help needed moving to and from a bed to a chair (including a wheelchair)?: A Lot (requires assist of bed functions) Help needed standing up from a chair using your arms (e.g., wheelchair or bedside chair)?: A Lot Help needed to walk in hospital room?: A Lot Help needed climbing 3-5 steps with a railing? : Total 6 Click Score: 13    End of Session Equipment Utilized During Treatment: Gait belt Activity Tolerance: Other (comment);Patient limited by fatigue (pt mobility is limited by weakness) Patient left: in chair;with call bell/phone within reach;with chair alarm set Nurse Communication: Mobility status;Other (comment) PT Visit Diagnosis: Other abnormalities of gait and mobility  (R26.89);Unsteadiness on feet (R26.81);Muscle weakness (generalized) (M62.81);History of falling (Z91.81);Difficulty in walking, not elsewhere classified (R26.2)     Time: 2952-8413 PT Time Calculation (min) (ACUTE ONLY): 91 min  Charges:    $Gait Training: 23-37 mins $Therapeutic Activity: 8-22 mins PT General Charges $$ ACUTE PT VISIT: 1 Visit                     Yoshiko Keleher B. Beverely Risen PT, DPT Acute Rehabilitation Services Please use secure chat or  Call Office [phone]    [facility]on Alas [facility] 04/29/2023, 3:06 PM

## 2023-04-29 NOTE — TOC Progression Note (Signed)
 Transition of Care [facility]) - Progression Note    Patient Details  Name: SHA AMER MRN: [id number] Date of Birth: 07-24-1955  [facility] CM/SW Contact  Lorri Frederick, LCSW Phone Number: 04/29/2023, 3:31 PM  Clinical Narrative:   CSW spoke with pt husband [name] regarding HD transport.  [name] is aware that outpt HD center has been identified.  [name] is planning to transport pt himself.  They have worked with one Production designer, theatre[employer]television/film, [facility] [employer], and been very happy with is, but it costs $120 per round trip, which is not affordable.  Discussed SNF but pt has been hospitalized several times since his last DC from SNF in 12/24 and has not achieved 60 days out of the hospital/SNF in order for his SNF days to reset.     Expected Discharge Plan: Home w Home Health Services Barriers to Discharge: No Barriers Identified  Expected Discharge Plan and Services In-house Referral: Clinical Social Work Discharge Planning Services: CM Consult Post Acute Care Choice: Home Health Living arrangements for the past 2 months: Single Family Home Expected Discharge Date: 04/10/23               DME Arranged: Bedside commode (bariatric) DME Agency:  Julianne Rice) Date DME Agency Contacted: 04/29/23 Time DME Agency Contacted: [phone] Representative spoke with at DME Agency: Vaughan Basta HH Arranged: RN, Disease Management, PT, OT, Nurse's Aide HH Agency: [facility]         Social Determinants of Health (SDOH) Interventions SDOH Screenings   Food Insecurity: No Food Insecurity (03/27/2023)  Housing: Low Risk  (03/28/2023)  Transportation Needs: No Transportation Needs (03/27/2023)  Utilities: Not At Risk (03/28/2023)  Social Connections: Unknown (03/28/2023)  Tobacco Use: Medium Risk (04/02/2023)    Readmission Risk Interventions     No data to display

## 2023-04-29 NOTE — Progress Notes (Signed)
 [facility]  Date of Admission:  03/27/2023     Reason for Follow Up: Abscess of skin of right ankle  Total days of antibiotics 23         ASSESSMENT:  [name] has been off antibiotics for just over a week with evidence of good healing and no further evidence of infection.  Discussed plan of care to continue with wound care per orthopedics and monitor off antibiotics.  Encourage protein intake to optimize healing.  Remaining medical and supportive care per internal medicine.  PLAN:  Continue off antibiotics. Wound care per orthopedics as needed. Encourage protein intake for healing. Continue contact precautions for MRSA. Remaining medical and supportive care per internal medicine.  ID will sign off.  Please reconsult if needed.  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, right (HCC)   PAD (peripheral artery disease) (HCC)   Osteomyelitis (HCC)   Obesity, Class II, BMI 35-39.9    allopurinol  50 mg Oral Daily   amiodarone  200 mg Oral BID   apixaban  5 mg Oral BID   atorvastatin  40 mg Oral Daily   carvedilol  3.125 mg Oral BID WC   Chlorhexidine Gluconate Cloth  6 each Topical Q0600   clopidogrel  75 mg Oral Q breakfast   docusate sodium  100 mg Oral BID   DULoxetine  60 mg Oral Daily   ezetimibe  10 mg Oral Daily   gabapentin  300 mg Oral QHS   guaiFENesin  600 mg Oral BID   insulin aspart  0-9 Units Subcutaneous Q6H   insulin glargine  40 Units Subcutaneous Daily   levothyroxine  88 mcg Oral QAC breakfast   polyethylene glycol  17 g  Oral Daily   sodium chloride flush  3 mL Intravenous Q12H   sodium chloride flush  3-10 mL Intravenous Q12H   tamsulosin  0.4 mg Oral Daily    SUBJECTIVE:  Afebrile overnight with no acute events.  Not currently on antibiotics and doing well. Allergies  Allergen Reactions   Doxycycline Hives and Rash   Septra [Bactrim] Itching   Penicillins Rash    Allergy to All cillin drugs  Ancef given 9/24 with no obvious reaction   Metformin And Related Diarrhea and Nausea Only   Other Nausea And Vomiting    General Anesthesia - sometimes causes nausea and vomiting * OK to use scopolamine patch     Sulfamethoxazole-Trimethoprim Other (See Comments)   Wellbutrin [Bupropion]     Mood Changes     Review of Systems: Review of Systems  Constitutional:  Negative for chills, fever and weight loss.  Respiratory:  Negative for cough, shortness of breath and wheezing.   Cardiovascular:  Negative for chest pain and leg swelling.  Gastrointestinal:  Negative for abdominal pain, constipation, diarrhea, nausea and vomiting.  Skin:  Negative for rash.      OBJECTIVE: Vitals:   04/28/23 1741 04/28/23 1942 04/29/23 0500 04/29/23 1050  BP: (!) 150/58 (!) 148/52 (!) 161/63 (!) 135/55  Pulse: Marland Kitchen)  55 (!) 54  (!) 52  Resp: 18 18 18 17   Temp: (!) 97.5 F (36.4 C) 98.3 F (36.8 C) 98.4 F (36.9 C) 98.8 F (37.1 C)  TempSrc:  Axillary  Axillary  SpO2: 95% 97%  97%  Weight:   (!) 138 kg   Height:       Body mass index is 40.14 kg/m.  Physical Exam Constitutional:      General: He is not in acute distress.    Appearance: He is well-developed.  Cardiovascular:     Rate and Rhythm: Normal rate and regular rhythm.     Heart sounds: Normal heart sounds.  Pulmonary:     Effort: Pulmonary effort is normal.     Breath sounds: Normal breath sounds.  Skin:    General: Skin is warm and dry.  Neurological:     Mental Status: He is alert.     Lab Results Lab Results  Component Value Date   WBC  6.2 04/28/2023   HGB 7.8 (L) 04/28/2023   HCT 23.3 (L) 04/28/2023   MCV 86.6 04/28/2023   PLT 181 04/28/2023    Lab Results  Component Value Date   CREATININE 9.31 (H) 04/29/2023   BUN 74 (H) 04/29/2023   NA 136 04/29/2023   K 3.8 04/29/2023   CL 100 04/29/2023   CO2 23 04/29/2023    Lab Results  Component Value Date   ALT 42 04/16/2023   AST 50 (H) 04/16/2023   GGT 28 02/20/2018   ALKPHOS 73 04/16/2023   BILITOT 0.6 04/16/2023     Microbiology: No results found for this or any previous visit (from the past 240 hours).   Marcos Eke, NP [facility]  04/29/2023  12:04 PM

## 2023-04-29 NOTE — Progress Notes (Signed)
 [facility]                                                                              Harold [name], is a 68 y.o. male, DOB - [birth year], UEA:[id number] Admit date - 03/27/2023    Outpatient Primary MD for the patient is Harold Northern, MD  LOS - 33  days  Chief Complaint  Patient presents with   Cough       Brief summary  Patient is a 68 year old male with morbid obesity, OSA, DM2, HTN, HLD, CAD/stent, A-fib on Eliquis, carotid artery stenosis, CKD, chronic anemia, anxiety/depression, diabetic neuropathy, GERD, hypothyroidism, COPD, pulmonary hypertension.  Patient presented with fever, cough, myalgia and generalized weakness and was found to be positive for influenza A.  Chest x-ray was negative for infiltrates.  Workup done during the hospital stay revealed right lower extremity wound infection/osteomyelitis of right ankle status post removal of hardware.  Cultures grew Enterobacter cloacae and MRSE.  Right lower extremity wound is healing well.  Patient has disease team directed antibiotics management.  Patient is currently being monitored off antibiotics.  Apparently, patient developed worsening renal function on antibiotics.  Patient is on intermittent dialysis for severe and dense ATN.  Nephrology input is appreciated.  Some improvement in urine output was documented recently, however, minimal elevation of BUN and serum creatinine is noted today, 04/29/2023.  04/29/2023: See above documentation.  Patient also reported dizziness, vertigo and double vision.  Telemetry revealed bradycardia, with heart rate in the low 40s.  Patient moves all EXTR.  Patient is on Coreg and amiodarone.  Coreg is on hold.  Cardiology team has been consulted to advise history and milligram.  CT head is negative for any acute event.   Assessment & Plan   Acute respiratory failure with hypoxia Influenza A -Originally admitted for influenza A on 03-27-2023. -Patient presented with 5 days of  cough, fever, myalgia.  Respiratory virus panel positive for influenza A.  -Chest x-ray did not reveal any infiltrates.   -Improved with symptomatic treatment. -Continue supportive care.  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 -S/p right ankle hardware removal, I&D and wound VAC placement on 2/10 by Dr. Jena Gauss -S/p repeat debridement of the right ankle by Dr. Lajoyce Corners 04/02/2023.   -ID was consulted, initially on cefepime, subsequently on daptomycin and ertapenem, now antibiotics held due to worsening renal function.  -Patient is currently monitored off of antibiotics for now.   AKI on CKD stage IIIb:  Nephrotic range proteinuria, uremia, Anion gap metabolic acidosis -Baseline creatinine appears to be 1.9-2.2.   -Renal function has continued to progressively worsen -Nephrology following, renal biopsy deferred due to high risk of bleeding.  Patient was also on Plavix for recent stent for PAD  -TDC placed on 3/4, underwent first HD session  -creatinine continues to rise -BUN of 75 and serum creatinine of 9.31 today.  Phosphorus is 5.9. -Nephrology team is considering another session of renal replacement therapy.  Right facial droop -On 3/1 patient noted to have right facial droop, evaluated by neurology,  MRI of the brain negative for acute stroke. -Currently on Plavix.  PAD -Seen by vascular surgery underwent aortogram, right leg angiogram, successful stenting of right superficial femoral artery on 04/01/2023.   -Continue Plavix, Zetia  Acute metabolic encephalopathy -Likely multifactorial, uremia, infection, drug-induced from possibility of cefepime induced encephalopathy, chronic worsening memory issues -Started on hemodialysis Continue Cymbalta, decreased Neurontin 04/26/2023: Encephalopathy seems to have resolved.   Type 2 diabetes mellitus /  Diabetic neuropathy, POA: A1c 7 on 01/02/2023. PTA meds-Tresiba 60 units daily, Mounjaro  CBG (last 3)  Recent Labs    04/29/23 0531 04/29/23 1204 04/29/23 1757  GLUCAP 134* 138* 232*   -Continue Semglee, SSI   Diabetic neuropathy -Gabapentin decreased due to worsening renal function. (PTA reportedly on gabapentin 1600 mg 3 times daily)   Hypokalemia/hypomagnesemia: Improved   Paroxysmal A-fib -HR noted to be in low 40s today.  Will hold Coreg. -Cardiology team consulted to advise.  Midodrine.  Patient is on amiodarone 200 Mg p.o. twice daily.    -CHADS-VASc of 4 -Eliquis initially held, but none resumed.  Heparin drip has been discontinued.     Essential hypertension -BP stable, continue low-dose Coreg   Anemia of chronic disease: Baseline hemoglobin between 10.5 11.5.   -H&H stable, borderline  CAD/stent Carotid artery stenosis HLD Continue PTA meds -Plavix, Crestor -Eliquis held   Hypothyroidism Continue Synthroid   Obstructive sleep apnea CPAP at bedtime    Gout Continue allopurinol, colchicine    Orthostatic hypotension -Received brief IV fluids, continue abdominal binder -2D echo showed EF 55 to 60%, basal inferior hypokinesis, G2 DD    Generalized debility -PT had recommended SNF however patient will have to pay out-of-pocket not able to afford.  Acute diarrhea Resolved   Influenza A:  - Presented with 5 days of cough, fever, myalgia.  - Respiratory virus panel positive for influenza A. Chest x-ray without acute infiltrate -Improved with symptomatic treatment, was outside 48-hour window for Tamiflu hence was not initiated.  Dizziness/vertigo/double vision: -Moves all extremities. -No speech problems. -Significant bradycardia, query symptomatic.  See above documentation. -Cannot rule out posterior circulation infarct. -CT head stat. -Patient is already on Eliquis and Plavix -Further management will depend on hospital course. -Low threshold  to proceed with MRI brain if symptoms do not improve with resolution of significant bradycardia.  Severe bradycardia: -Query symptomatic. -Heart rate in low 40s. -Hold Coreg. -Cardiology team consulted.  Patient is on amiodarone 200 Mg p.o. twice daily. -Dose all medications, considering estimated GFR of less than 7.5 mL/min per 1.73 m.  Morbid obesity: Estimated body mass index is 40.14 kg/m as calculated from the following:   Height as of this encounter: 6\' 1"  (1.854 m).   Weight as of this encounter: 138 kg.  Code Status: Full code DVT Prophylaxis:  Place and maintain sequential compression device Start: 04/15/23 1549 SCDs Start: 04/02/23 1617 apixaban (ELIQUIS) tablet 5 mg   Level of Care: Level of care: Progressive Cardiac Family Communication:  Disposition Plan:      Remains inpatient appropriate:      Procedures:  2D echo [facility] placement 04/22/23, started on HD #1 on 04/22/23  Consultants:   Renal Ortho ID   Antimicrobials:   Anti-infectives (From admission, onward)    Start     Dose/Rate Route Frequency Ordered Stop   04/22/23 1300  vancomycin (VANCOCIN) IVPB 1000 mg/200 mL premix        1,000 mg 200 mL/hr over 60 Minutes Intravenous To Radiology 04/22/23 1110 04/22/23  1849   04/19/23 1000  ertapenem (INVANZ) 500 mg in sodium chloride 0.9 % 50 mL IVPB  Status:  Discontinued        500 mg 100 mL/hr over 30 Minutes Intravenous Daily 04/18/23 0931 04/18/23 1106   04/17/23 1800  ertapenem (INVANZ) 1 g in sodium chloride 0.9 % 100 mL IVPB  Status:  Discontinued        1 g 200 mL/hr over 30 Minutes Intravenous Daily 04/17/23 1354 04/18/23 0931   04/08/23 0000  daptomycin (CUBICIN) IVPB  Status:  Discontinued        700 mg Intravenous Every 24 hours 04/08/23 1147 04/08/23    04/08/23 0000  ceFEPime (MAXIPIME) IVPB  Status:  Discontinued        2 g Intravenous Every 12 hours 04/08/23 1147 04/18/23    04/08/23 0000  daptomycin (CUBICIN) IVPB  Status:  Discontinued         700 mg Intravenous Every 24 hours 04/08/23 1150 04/18/23    04/04/23 1400  DAPTOmycin (CUBICIN) IVPB 700 mg/169mL premix  Status:  Discontinued        700 mg 200 mL/hr over 30 Minutes Intravenous Daily 04/03/23 1435 04/18/23 1106   04/02/23 1715  ceFAZolin (ANCEF) IVPB 2g/100 mL premix  Status:  Discontinued        2 g 200 mL/hr over 30 Minutes Intravenous Every 8 hours 04/02/23 1616 04/02/23 1623   04/02/23 0600  ceFAZolin (ANCEF) IVPB 3g/150 mL premix  Status:  Discontinued        3 g 300 mL/hr over 30 Minutes Intravenous On call to O.R. 04/01/23 1700 04/02/23 1549   03/31/23 1015  ceFAZolin (ANCEF) IVPB 2g/100 mL premix        2 g 200 mL/hr over 30 Minutes Intravenous On call to O.R. 03/31/23 0921 03/31/23 1203   03/29/23 2200  vancomycin (VANCOREADY) IVPB 2000 mg/400 mL  Status:  Discontinued       Placed in "Followed by" Linked Group   2,000 mg 200 mL/hr over 120 Minutes Intravenous Every 48 hours 03/27/23 2257 04/03/23 1435   03/28/23 1045  metroNIDAZOLE (FLAGYL) tablet 500 mg  Status:  Discontinued        500 mg Oral Every 12 hours 03/28/23 0955 04/03/23 0945   03/27/23 2330  vancomycin (VANCOCIN) 2,500 mg in sodium chloride 0.9 % 500 mL IVPB       Placed in "Followed by" Linked Group   2,500 mg 262.5 mL/hr over 120 Minutes Intravenous  Once 03/27/23 2257 03/28/23 0241   03/27/23 2300  metroNIDAZOLE (FLAGYL) IVPB 500 mg  Status:  Discontinued        500 mg 100 mL/hr over 60 Minutes Intravenous Every 12 hours 03/27/23 2247 03/28/23 0955   03/27/23 2300  ceFEPIme (MAXIPIME) 2 g in sodium chloride 0.9 % 100 mL IVPB  Status:  Discontinued        2 g 200 mL/hr over 30 Minutes Intravenous Every 12 hours 03/27/23 2257 04/17/23 1354   03/27/23 2100  ceFAZolin (ANCEF) IVPB 2g/100 mL premix        2 g 200 mL/hr over 30 Minutes Intravenous  Once 03/27/23 2058 03/27/23 2219          Medications  allopurinol  50 mg Oral Daily   amiodarone  200 mg Oral BID   apixaban  5 mg Oral  BID   atorvastatin  40 mg Oral Daily   Chlorhexidine Gluconate Cloth  6 each Topical Q0600   [  START ON 04/30/2023] Chlorhexidine Gluconate Cloth  6 each Topical Q0600   clopidogrel  75 mg Oral Q breakfast   docusate sodium  100 mg Oral BID   DULoxetine  60 mg Oral Daily   ezetimibe  10 mg Oral Daily   gabapentin  300 mg Oral QHS   guaiFENesin  600 mg Oral BID   insulin aspart  0-9 Units Subcutaneous Q6H   insulin glargine  40 Units Subcutaneous Daily   levothyroxine  88 mcg Oral QAC breakfast   polyethylene glycol  17 g Oral Daily   sodium chloride flush  3 mL Intravenous Q12H   sodium chloride flush  3-10 mL Intravenous Q12H   tamsulosin  0.4 mg Oral Daily      Subjective:  Patient reported dizziness, vertigo and double vision today.  Objective:   Vitals:   04/29/23 1050 04/29/23 1204 04/29/23 1250 04/29/23 1828  BP: (!) 135/55 101/63 (!) 103/52 (!) 152/80  Pulse: (!) 52     Resp: 17 18    Temp: 98.8 F (37.1 C) 98.4 F (36.9 C)    TempSrc: Axillary Oral    SpO2: 97%  96%   Weight:      Height:        Intake/Output Summary (Last 24 hours) at 04/29/2023 1917 Last data filed at 04/29/2023 1828 Gross per 24 hour  Intake 600 ml  Output 2800 ml  Net -2200 ml      Wt Readings from Last 3 Encounters:  04/29/23 (!) 138 kg  01/28/23 (!) 142.9 kg  01/09/23 (!) 145 kg    Physical Exam General: Patient is obese.  Awake and alert.  Not in any distress.   Cardiovascular: S1 S2   Respiratory: Clear to auscultation. Gastrointestinal: Obese, soft and nontender. Ext: No significant lower extremity edema.  Right lower leg and foot in a splint.   Data Reviewed:  I have personally reviewed following labs    CBC Lab Results  Component Value Date   WBC 6.2 04/28/2023   RBC 2.69 (L) 04/28/2023   HGB 7.8 (L) 04/28/2023   HCT 23.3 (L) 04/28/2023   MCV 86.6 04/28/2023   MCH 29.0 04/28/2023   PLT 181 04/28/2023   MCHC 33.5 04/28/2023   RDW 15.8 (H) 04/28/2023    LYMPHSABS 0.9 04/15/2023   MONOABS 0.6 04/15/2023   EOSABS 0.3 04/15/2023   BASOSABS 0.1 04/15/2023     Last metabolic panel Lab Results  Component Value Date   NA 136 04/29/2023   K 3.8 04/29/2023   CL 100 04/29/2023   CO2 23 04/29/2023   BUN 74 (H) 04/29/2023   CREATININE 9.31 (H) 04/29/2023   GLUCOSE 159 (H) 04/29/2023   GFRNONAA 6 (L) 04/29/2023   GFRAA 47 (L) 09/28/2019   CALCIUM 8.9 04/29/2023   PHOS 5.9 (H) 04/29/2023   PROT 5.8 (L) 04/16/2023   ALBUMIN 2.5 (L) 04/29/2023   LABGLOB 2.2 04/16/2023   AGRATIO 1.7 04/16/2023   BILITOT 0.6 04/16/2023   ALKPHOS 73 04/16/2023   AST 50 (H) 04/16/2023   ALT 42 04/16/2023   ANIONGAP 13 04/29/2023    CBG (last 3)  Recent Labs    04/29/23 0531 04/29/23 1204 04/29/23 1757  GLUCAP 134* 138* 232*      Coagulation Profile: No results for input(s): "INR", "PROTIME" in the last 168 hours.   Radiology Studies: I have personally reviewed the imaging studies  CT HEAD WO CONTRAST ( ) Result Date: 04/29/2023 CLINICAL DATA:  67 year old male  with vertigo. EXAM: CT HEAD WITHOUT CONTRAST TECHNIQUE: Contiguous axial images were obtained from the base of the skull through the vertex without intravenous contrast. RADIATION DOSE REDUCTION: This exam was performed according to the departmental dose-optimization program which includes automated exposure control, adjustment of the mA and/or kV according to patient size and/or use of iterative reconstruction technique. COMPARISON:  Brain MRI and head CT 04/19/2023 FINDINGS: Brain: No midline shift, ventriculomegaly, mass effect, evidence of mass lesion, intracranial hemorrhage or evidence of cortically based acute infarction. Gray-white differentiation stable and within normal limits for age. Vascular: No suspicious intracranial vascular hyperdensity. Extensive calcified atherosclerosis at the skull base. Skull: No acute osseous abnormality identified. Partially visible advanced upper cervical  spine degeneration. Sinuses/Orbits: Visualized paranasal sinuses and mastoids are stable and well aerated. Tympanic cavities appear clear. Other: Stable orbit and scalp soft tissues. Some calcified scalp vessel atherosclerosis. IMPRESSION: 1. Stable and negative for age noncontrast CT appearance of the Brain. 2. Advanced calcified atherosclerosis. Electronically Signed   By: Odessa Fleming M.D.   On: 04/29/2023 15:41      Time spent 35 minutes.   Barnetta Chapel M.D. [facility] 04/29/2023, 7:17 PM  Available via Epic secure chat 7am-7pm After 7 pm, please refer to night coverage provider listed on amion.

## 2023-04-29 NOTE — TOC Progression Note (Signed)
 Transition of Care [facility]) - Progression Note    Patient Details  Name: Harold [name] MRN: [id number] Date of Birth: 09/22/1955  Transition of Care [facility]) CM/SW Contact  Graves-Bigelow, Lamar Laundry, RN Phone Number: 04/29/2023, 2:43 PM  Clinical Narrative: Case Manager spoke with Physical Therapist and the patient will benefit from a bariatric bedside commode for home- order placed and DME submitted to [employer] for insurance approval. Per MD notes, plan is to discharge on 04-30-23; Case Manager spoke with spouse and he will be out of town until Friday afternoon. Outpatient HD has been approved-CLIP completed. Message has been sent to the provider regarding spouse is out of town. [facility] is aware that the patient will continue to need home health services. Case Manager will continue to follow for additional transition of care needs.  Expected Discharge Plan: Home w Home Health Services Barriers to Discharge: No Barriers Identified  Expected Discharge Plan and Services In-house Referral: Clinical Social Work Discharge Planning Services: CM Consult Post Acute Care Choice: Home Health Living arrangements for the past 2 months: Single Family Home Expected Discharge Date: 04/10/23               DME Arranged: Bedside commode (bariatric) DME Agency:  Julianne Rice) Date DME Agency Contacted: 04/29/23 Time DME Agency Contacted: [phone] Representative spoke with at DME Agency: Vaughan Basta HH Arranged: RN, Disease Management, PT, OT, Nurse's Aide HH Agency: [facility]  Social Determinants of Health (SDOH) Interventions SDOH Screenings   Food Insecurity: No Food Insecurity (03/27/2023)  Housing: Low Risk  (03/28/2023)  Transportation Needs: No Transportation Needs (03/27/2023)  Utilities: Not At Risk (03/28/2023)  Social Connections: Unknown (03/28/2023)  Tobacco Use: Medium Risk (04/02/2023)   Readmission Risk Interventions     No data to display

## 2023-04-29 NOTE — Plan of Care (Signed)
  Problem: Pain Managment: Goal: General experience of comfort will improve and/or be controlled Outcome: Progressing   Problem: Safety: Goal: Ability to remain free from injury will improve Outcome: Progressing   Problem: Skin Integrity: Goal: Risk for impaired skin integrity will decrease Outcome: Progressing

## 2023-04-29 NOTE — Progress Notes (Signed)
 Pt c/o very lightheadedness, dizzy, double vision. BP 103/52 map 67, HR 40s. CBG 138. Berton Mount, MD at bedside. See new orders.

## 2023-04-29 NOTE — Plan of Care (Signed)
   Problem: Education: Goal: Knowledge of General Education information will improve Description Including pain rating scale, medication(s)/side effects and non-pharmacologic comfort measures Outcome: Progressing   Problem: Health Behavior/Discharge Planning: Goal: Ability to manage health-related needs will improve Outcome: Progressing

## 2023-04-30 ENCOUNTER — Ambulatory Visit: Payer: Medicare Other | Admitting: Internal Medicine

## 2023-04-30 ENCOUNTER — Other Ambulatory Visit: Payer: Self-pay | Admitting: *Deleted

## 2023-04-30 ENCOUNTER — Other Ambulatory Visit (HOSPITAL_COMMUNITY): Payer: Self-pay

## 2023-04-30 DIAGNOSIS — L02415 Cutaneous abscess of right lower limb: Secondary | ICD-10-CM | POA: Diagnosis not present

## 2023-04-30 DIAGNOSIS — I70213 Atherosclerosis of native arteries of extremities with intermittent claudication, bilateral legs: Secondary | ICD-10-CM

## 2023-04-30 DIAGNOSIS — I739 Peripheral vascular disease, unspecified: Secondary | ICD-10-CM

## 2023-04-30 LAB — GLUCOSE, CAPILLARY
Glucose-Capillary: 130 mg/dL — ABNORMAL HIGH (ref 70–99)
Glucose-Capillary: 149 mg/dL — ABNORMAL HIGH (ref 70–99)
Glucose-Capillary: 169 mg/dL — ABNORMAL HIGH (ref 70–99)
Glucose-Capillary: 187 mg/dL — ABNORMAL HIGH (ref 70–99)
Glucose-Capillary: 210 mg/dL — ABNORMAL HIGH (ref 70–99)
Glucose-Capillary: 214 mg/dL — ABNORMAL HIGH (ref 70–99)

## 2023-04-30 LAB — RENAL FUNCTION PANEL
Albumin: 2.7 g/dL — ABNORMAL LOW (ref 3.5–5.0)
Anion gap: 10 (ref 5–15)
BUN: 35 mg/dL — ABNORMAL HIGH (ref 8–23)
CO2: 27 mmol/L (ref 22–32)
Calcium: 8.9 mg/dL (ref 8.9–10.3)
Chloride: 97 mmol/L — ABNORMAL LOW (ref 98–111)
Creatinine, Ser: 5.36 mg/dL — ABNORMAL HIGH (ref 0.61–1.24)
GFR, Estimated: 11 mL/min — ABNORMAL LOW (ref >60)
Glucose, Bld: 167 mg/dL — ABNORMAL HIGH (ref 70–99)
Phosphorus: 3 mg/dL (ref 2.5–4.6)
Potassium: 3.2 mmol/L — ABNORMAL LOW (ref 3.5–5.1)
Sodium: 134 mmol/L — ABNORMAL LOW (ref 135–145)

## 2023-04-30 MED ORDER — INSULIN GLARGINE 100 UNIT/ML ~~LOC~~ SOLN
45.0000 [IU] | Freq: Every day | SUBCUTANEOUS | Status: DC
  Administered 2023-04-30 – 2023-05-04 (×5): 45 [IU] via SUBCUTANEOUS
  Filled 2023-04-30 (×7): qty 0.45

## 2023-04-30 MED ORDER — AMLODIPINE BESYLATE 5 MG PO TABS
5.0000 mg | ORAL_TABLET | Freq: Every day | ORAL | Status: DC
Start: 2023-04-30 — End: 2023-05-01
  Administered 2023-04-30: 5 mg via ORAL
  Filled 2023-04-30: qty 1

## 2023-04-30 MED ORDER — POTASSIUM CHLORIDE CRYS ER 20 MEQ PO TBCR
40.0000 meq | EXTENDED_RELEASE_TABLET | Freq: Once | ORAL | Status: AC
  Administered 2023-04-30: 40 meq via ORAL
  Filled 2023-04-30: qty 2

## 2023-04-30 MED ORDER — CHLORHEXIDINE GLUCONATE CLOTH 2 % EX PADS: 6.0000 | MEDICATED_PAD | Freq: Every day | CUTANEOUS | Status: DC

## 2023-04-30 NOTE — Care Management Important Message (Signed)
 Important Message  Patient Details  Name: Harold [name] MRN: [id number] Date of Birth: 1955-11-01   Important Message Given:  Yes - Medicare IM     Renie Ora 04/30/2023, 10:37 AM

## 2023-04-30 NOTE — Progress Notes (Addendum)
 Contacted [facility] to inquire if pt can start on Saturday. Awaiting a return call.   Olivia Canter Renal Navigator [phone]  Addendum at 1:35 pm: Advised by clinic manager at [facility] that pt can start on Saturday if pt is d/c on Friday. Pt will need to arrive at 9:30 am. I spoke with pt's spouse via phone to be advised that pt can start Saturday if d/c Friday. Spouse also advised that HD staff are unable to push, pull, and physically lift pt from w/c to HD. Spouse voices understanding. HD arrangements added to AVS. Contacted renal PA regarding clinic's need for orders. Update provided to team that pt can start at clinic on Saturday if pt is d/c Friday. Will assist as needed.   Addendum at 1:54 pm: Per attending, plan will be for pt to d/c on Saturday after HD due to preparation for pt to return home safely. Contacted [facility] to be advised that plans are for pt to d/c Saturday and should start at clinic on Tuesday.

## 2023-04-30 NOTE — Progress Notes (Signed)
 Completed 3.5 hours of dialysis. Tolerated net fluid removal of 3L. VS stable. No significant event. LIJ TDC hep-locked and de-accessed using aseptique technique. Caps replaced. Patient is alert and conversant. No signs of distress noted. Handoff report to [facility]

## 2023-04-30 NOTE — Progress Notes (Signed)
 Physical Therapy Treatment Patient Details Name: Harold [name] MRN: [id number] DOB: Feb 14, 1956 Today's Date: 04/30/2023   History of Present Illness 68 y.o. male presents to [facility] 03/27/23 with productive cough and generalized weakness. Admitted with influenza A and cellulitis of R foot s/p hardware removal of the R ankle and application of wound vac on 2/10, wound vac removed 2/27. Left thigh pain, CT negative. Prior admit 11/24 for R ankle ORIF and in 12/24 for revision. S/p excisional debridement of right ankle on 2/12. Pt underwent central line placement on 04/07/2023 however he has been having issues with recurrent bleeding from the insertion site which has finally stopped. 04/17/23 pt with drop in sats requiring 2 L of O2 via [place], chest xray concerning for edema or possibly pneumonia.Significant PMH: HTN, HLD, CAD s/p PCI, COPD, DM2, CKD stage IIIb, PVD, gout, OSA, neuropathy, COPD, a-fib, CAD s/p percutaneous coronary angioplasty, angina, obesity    PT Comments  STAR PT/OT Session: Focus to progress mobility and independence. Vital signs with ambulation improved today. HR 50-76 bpm during mobility, BP 116/57 with ambulation. Pt is contact guard assist for bed mobility with use of bed rail, (which he has on his bed at home). Pt able to stand with modAx2  and pivot to recliner with min Ax2. Pt stands from recliner with mod-maxAx2, for 3 bouts of ambulation. Initially self limiting but able to ambulate 30 feet in last bout with min-modA for steadying. Pt left up in recliner, encouraged to work on endurance for out of bed to prepare for discharge home and going to dialysis. Pt in agreement to stay up to eat lunch. Plan for discharge home once husband arrives from out of town. PT will continue to follow acutely.    If plan is discharge home, recommend the following: A lot of help with walking and/or transfers;A lot of help with bathing/dressing/bathroom;Assistance with cooking/housework;Assist for  transportation;Help with stairs or ramp for entrance   Can travel by private vehicle     Yes  Equipment Recommendations  BSC/3in1       Precautions / Restrictions Precautions Precautions: Fall Recall of Precautions/Restrictions: Impaired Precaution/Restrictions Comments: Watch BP Required Braces or Orthoses: Other Brace Other Brace: R splint in bed, abdominal binder (Prn) Restrictions Weight Bearing Restrictions Per Provider Order: Yes RLE Weight Bearing Per Provider Order: Weight bearing as tolerated Other Position/Activity Restrictions: per chat with Dr Lajoyce Corners, 3/12 pt no longer needs CAM boot per Dr. Audrie Lia note 04/03/2023 and orders 04/03/2023 pt WBAT     Mobility  Bed Mobility Overal bed mobility: Needs Assistance Bed Mobility: Rolling, Supine to Sit, Sit to Supine Rolling: Contact guard assist   Supine to sit: Contact guard, Used rails Sit to supine: Contact guard assist, Used rails   General bed mobility comments: Pt able with sequencial VCs to roll to sit up and to lay back down HOB flat and no rail. He also tried to use the "bed ladder" his husband got for him, this was more of struggle    Transfers Overall transfer level: Needs assistance Equipment used: Rolling walker (2 wheels) Transfers: Sit to/from Stand, Bed to chair/wheelchair/BSC Sit to Stand: Max assist, Mod assist, +2 physical assistance (from bed and recliner)   Step pivot transfers: Min assist (+2 for safety)            Ambulation/Gait Ambulation/Gait assistance: Min assist, Mod assist Gait Distance (Feet): 8 Feet (+20, +30) Assistive device: Rolling walker (2 wheels) Gait Pattern/deviations: Step-through pattern, Trunk flexed, Decreased step length -  right, Decreased step length - left Gait velocity: reduced Gait velocity interpretation: <1.31 ft/sec, indicative of household ambulator   General Gait Details: grossly min A for steadying, increases to modA at end of 30 foot ambulation when pt  asked to push himself. continues to have difficulty advancing RLE due to CAM boot and external rotation of R LE,          Balance Overall balance assessment: Needs assistance Sitting-balance support: No upper extremity supported, Feet supported Sitting balance-Leahy Scale: Fair   Postural control: Posterior lean Standing balance support: Bilateral upper extremity supported, During functional activity, Reliant on assistive device for balance Standing balance-Leahy Scale: Poor Standing balance comment: reliant on RW for support                            Communication Communication Communication: No apparent difficulties  Cognition Arousal: Alert Behavior During Therapy: WFL for tasks assessed/performed   PT - Cognitive impairments: Safety/Judgement                       PT - Cognition Comments: pt has poor insight into how level of discomfort associated with his mobility is unavoidable given his level of deconditioning, and his need to start building tolerance back especially now since he will need to go to dialysis 3x/wk Following commands: Impaired Following commands impaired: Follows one step commands with increased time    Cueing Cueing Techniques: Verbal cues, Tactile cues     General Comments General comments (skin integrity, edema, etc.): HR improved >50 throughout session, mid 60s to mid 70s with ambulation, BP after ambulation 116/57, contacted orthopedist and pt no longer needs to wear CAM boot      Pertinent Vitals/Pain Pain Assessment Pain Assessment: Faces Faces Pain Scale: Hurts a little bit Pain Location: generalized Pain Descriptors / Indicators: Discomfort, Grimacing Pain Intervention(s): Limited activity within patient's tolerance, Monitored during session, Patient requesting pain meds-RN notified     PT Goals (current goals can now be found in the care plan section) Acute Rehab PT Goals PT Goal Formulation: With patient Time For  Goal Achievement: 05/01/23 Potential to Achieve Goals: Fair Progress towards PT goals: Progressing toward goals    Frequency    Min 1X/week           Co-evaluation   Reason for Co-Treatment: For patient/therapist safety;To address functional/ADL transfers PT goals addressed during session: Mobility/safety with mobility;Proper use of DME;Balance OT goals addressed during session: ADL's and self-care;Strengthening/ROM      AM-PAC PT "6 Clicks" Mobility   Outcome Measure  Help needed turning from your back to your side while in a flat bed without using bedrails?: A Little Help needed moving from lying on your back to sitting on the side of a flat bed without using bedrails?: A Little Help needed moving to and from a bed to a chair (including a wheelchair)?: A Lot Help needed standing up from a chair using your arms (e.g., wheelchair or bedside chair)?: A Lot Help needed to walk in hospital room?: A Lot Help needed climbing 3-5 steps with a railing? : Total 6 Click Score: 13    End of Session Equipment Utilized During Treatment: Gait belt Activity Tolerance: Patient tolerated treatment well Patient left: in chair;with call bell/phone within reach;with chair alarm set Nurse Communication: Mobility status;Other (comment) PT Visit Diagnosis: Other abnormalities of gait and mobility (R26.89);Unsteadiness on feet (R26.81);Muscle weakness (generalized) (M62.81);History of falling (  Z91.81);Difficulty in walking, not elsewhere classified (R26.2)     Time: 1100-1147 PT Time Calculation (min) (ACUTE ONLY): 47 min  Charges:    $Gait Training: 8-22 mins PT General Charges $$ ACUTE PT VISIT: 1 Visit                     Peni Rupard B. Beverely Risen PT, DPT Acute Rehabilitation Services Please use secure chat or  Call Office [phone]    Elon Alas [facility] 04/30/2023, 4:55 PM

## 2023-04-30 NOTE — Progress Notes (Signed)
 [facility] NEPHROLOGY PROGRESS NOTE  Assessment/ Plan: Pt is a 68 y.o. yo male    # Dialysis dependent AKI on CKD 3; ATN and AIN both possible. -CLIP as AKI to [place] start 3/11 if needed. -Status post dialysis yesterday with 3 L UF, tolerated well and feels great.  He is nonoliguric but very high BUN and creatinine level w/significant volume overload.  Plan for next HD tomorrow and continue outpatient schedule.  # Hypertension with tendency towards orthostasis on low-dose carvedilol and tamsulosin.  # Nephrotic range proteinuria with negative serological evaluation but decision against biopsy at the current time per previous notes.  # Right lower extremity infection with involvement of orthopedic hardware status post removal and antibiotics discontinued 2/28 with concern for AIN.  #Chronic anemia, hemoglobin in the 7s.check iron.   #Mild hyponatremia, continue to limit free water intake.  HD today.  # Increased anion gap metabolic acidosis, managed with dialysis.  # Hyponatremia: Replete potassium chloride and adjust potassium bath during dialysis.  Subjective: Seen and examined at bedside.  Tolerated dialysis well.  Denies nausea, vomiting, chest pain or shortness of breath.  Feels much better  after HD yesterday.  Objective Vital signs in last 24 hours: Vitals:   04/30/23 0213 04/30/23 0437 04/30/23 0808 04/30/23 1148  BP:  (!) 158/66 (!) 149/64   Pulse: (!) 54 (!) 56  (!) 52  Resp: 18 17 18 18   Temp:  98.6 F (37 C) 98.7 F (37.1 C) 98.2 F (36.8 C)  TempSrc:  Axillary Oral Oral  SpO2: 100% 100%  100%  Weight:  130 kg    Height:       Weight change: -8 kg  Intake/Output Summary (Last 24 hours) at 04/30/2023 1259 Last data filed at 04/30/2023 0439 Gross per 24 hour  Intake 380 ml  Output 4300 ml  Net -3920 ml       Labs: RENAL PANEL Recent Labs  Lab 04/26/23 0705 04/27/23 0530 04/27/23 2145 04/28/23 0530 04/29/23 1015 04/30/23 0205  NA  134* 132* 131* 134* 136 134*  K 3.5 3.6 3.9 3.8 3.8 3.2*  CL 98 100 96* 100 100 97*  CO2 24 21* 19* 21* 23 27  GLUCOSE 137* 193* 257* 177* 159* 167*  BUN 63* 63* 69* 70* 74* 35*  CREATININE 8.00* 8.69* 9.02* 9.06* 9.31* 5.36*  CALCIUM 8.7* 8.4* 8.8* 8.5* 8.9 8.9  MG  --   --  2.1  --   --   --   PHOS 4.9* 5.3*  --  5.6* 5.9* 3.0  ALBUMIN 2.3* 2.3*  --  2.4* 2.5* 2.7*    Liver Function Tests: Recent Labs  Lab 04/28/23 0530 04/29/23 1015 04/30/23 0205  ALBUMIN 2.4* 2.5* 2.7*   No results for input(s): "LIPASE", "AMYLASE" in the last 168 hours. No results for input(s): "AMMONIA" in the last 168 hours. CBC: Recent Labs    03/28/23 0810 03/29/23 0407 04/24/23 0400 04/25/23 0442 04/26/23 0705 04/27/23 0530 04/28/23 0530  HGB  --    < > 7.8* 8.0* 7.9* 7.7* 7.8*  MCV  --    < > 84.9 85.0 85.7 86.1 86.6  VITAMINB12 305  --   --   --   --   --   --    < > = values in this interval not displayed.    Cardiac Enzymes: Recent Labs  Lab 04/25/23 0442  CKTOTAL 187   CBG: Recent Labs  Lab 04/29/23 1757 04/30/23 0033 04/30/23  1610 04/30/23 0805 04/30/23 1151  GLUCAP 232* 130* 210* 214* 149*    Iron Studies: No results for input(s): "IRON", "TIBC", "TRANSFERRIN", "FERRITIN" in the last 72 hours. Studies/Results: CT HEAD WO CONTRAST ( ) Result Date: 04/29/2023 CLINICAL DATA:  68 year old male with vertigo. EXAM: CT HEAD WITHOUT CONTRAST TECHNIQUE: Contiguous axial images were obtained from the base of the skull through the vertex without intravenous contrast. RADIATION DOSE REDUCTION: This exam was performed according to the departmental dose-optimization program which includes automated exposure control, adjustment of the mA and/or kV according to patient size and/or use of iterative reconstruction technique. COMPARISON:  Brain MRI and head CT 04/19/2023 FINDINGS: Brain: No midline shift, ventriculomegaly, mass effect, evidence of mass lesion, intracranial hemorrhage or  evidence of cortically based acute infarction. Gray-white differentiation stable and within normal limits for age. Vascular: No suspicious intracranial vascular hyperdensity. Extensive calcified atherosclerosis at the skull base. Skull: No acute osseous abnormality identified. Partially visible advanced upper cervical spine degeneration. Sinuses/Orbits: Visualized paranasal sinuses and mastoids are stable and well aerated. Tympanic cavities appear clear. Other: Stable orbit and scalp soft tissues. Some calcified scalp vessel atherosclerosis. IMPRESSION: 1. Stable and negative for age noncontrast CT appearance of the Brain. 2. Advanced calcified atherosclerosis. Electronically Signed   By: Odessa Fleming M.D.   On: 04/29/2023 15:41    Medications: Infusions:    Scheduled Medications:  allopurinol  50 mg Oral Daily   amiodarone  200 mg Oral BID   amLODipine  5 mg Oral Daily   apixaban  5 mg Oral BID   atorvastatin  40 mg Oral Daily   Chlorhexidine Gluconate Cloth  6 each Topical Q0600   Chlorhexidine Gluconate Cloth  6 each Topical Q0600   clopidogrel  75 mg Oral Q breakfast   docusate sodium  100 mg Oral BID   DULoxetine  60 mg Oral Daily   ezetimibe  10 mg Oral Daily   gabapentin  300 mg Oral QHS   guaiFENesin  600 mg Oral BID   insulin aspart  0-9 Units Subcutaneous Q6H   insulin glargine  45 Units Subcutaneous Daily   levothyroxine  88 mcg Oral QAC breakfast   polyethylene glycol  17 g Oral Daily   sodium chloride flush  3 mL Intravenous Q12H   sodium chloride flush  3-10 mL Intravenous Q12H   tamsulosin  0.4 mg Oral Daily    have reviewed scheduled and prn medications.  Physical Exam: General:NAD, comfortable Heart:RRR, s1s2 nl Lungs: Reduced breath sound bilateral basal Abdomen:soft, Non-tender, distended Extremities:b/l leg edema+, dependent edema present Dialysis Access: TDC in place  Harold [name] [employer] 04/30/2023,12:59 PM  LOS: 34 days

## 2023-04-30 NOTE — Consult Note (Signed)
 WOC Nurse Consult Note: Reason for Consult:Consulted for suture removal to right lateral malleolus and right medial malleolus.  Photo in chart.  Medial aspect is healed with approximated edges. Lateral aspect remains open and will implement three times weekly topical treatment.  Wound type: ischemic ulcer.  S/P ORIF trimalleolar right ankle fracture.  Known venous disease.   Pressure Injury POA: NA dorsal aspect right foot with maroon discoloration, consistent with device related skin injury from hard splint on right lower leg Measurement:  13 cm x 3 cm x 01 cm right lateral malleolus.   Wound bed: tension sutures removed   Drainage (amount, consistency, odor) minimal serosanguinous  no odor Right medical aspect has healed Periwound: Device related pressure injury to right dorsal foot.  Dressing procedure/placement/frequency: foam dressing to pad and protect dorsal foot.  CLeanse wound to right lateral malleolus with NS and pat dry. Apply mepitel silicone nonadherent dressing and top with strip of aquacel  cover with gauze and kerlix/tape.  Change mon.Wed.Fri.  Will not follow at this time.  Please re-consult if needed.  Mike Gip MSN, RN, FNP-BC CWON Wound, Ostomy, Continence Nurse Outpatient [facility] [phone] Pager [phone]

## 2023-04-30 NOTE — Progress Notes (Addendum)
 Occupational Therapy Treatment Patient Details Name: Harold [name] MRN: [id number] DOB: 1955/07/30 Today's Date: 04/30/2023   History of present illness 68 y.o. male presents to [facility] 03/27/23 with productive cough and generalized weakness. Admitted with influenza A and cellulitis of R foot s/p hardware removal of the R ankle and application of wound vac on 2/10, wound vac removed 2/27. Left thigh pain, CT negative. Prior admit 11/24 for R ankle ORIF and in 12/24 for revision. S/p excisional debridement of right ankle on 2/12. Pt underwent central line placement on 04/07/2023 however he has been having issues with recurrent bleeding from the insertion site which has finally stopped. 04/17/23 pt with drop in sats requiring 2 L of O2 via [place], chest xray concerning for edema or possibly pneumonia.Significant PMH: HTN, HLD, CAD s/p PCI, COPD, DM2, CKD stage IIIb, PVD, gout, OSA, neuropathy, COPD, a-fib, CAD s/p percutaneous coronary angioplasty, angina, obesity   OT comments  This 68 yo male seen today with PT to try and continue to progress ambulation. PT motivated today to get up and walk and he did the most he has done in a long time. He was encouraged to get up to [facility] and not use the bedpan as well as starting to use the urinal instead of the primo-fit; since he will not have either of these at home.       If plan is discharge home, recommend the following:  A lot of help with bathing/dressing/bathroom;Assistance with cooking/housework;Help with stairs or ramp for entrance;Assist for transportation;Two people to help with walking and/or transfers   Equipment Recommendations  Other (comment) (bariatric BSC)       Precautions / Restrictions Precautions Precautions: Fall Recall of Precautions/Restrictions: Impaired Precaution/Restrictions Comments: Watch BP Required Braces or Orthoses: Other Brace Other Brace: R CAM boot for ambulation, R splint in bed, abdominal binder (Prn) Restrictions Weight  Bearing Restrictions Per Provider Order: Yes RUE Weight Bearing Per Provider Order: Touch down weight bearing RLE Weight Bearing Per Provider Order: Weight bearing as tolerated Other Position/Activity Restrictions: per Dr. Audrie Lia note 04/03/2023 and orders 04/03/2023 pt WBAT       Mobility Bed Mobility Overal bed mobility: Needs Assistance Bed Mobility: Rolling, Supine to Sit, Sit to Supine Rolling: Contact guard assist   Supine to sit: Contact guard, Used rails Sit to supine: Contact guard assist, Used rails   General bed mobility comments: Pt able with sequencial VCs to roll to sit up and to lay back down HOB flat and no rail. He also tried to use the "bed ladder" his husband got for him, this was more of struggle    Transfers Overall transfer level: Needs assistance Equipment used: Rolling walker (2 wheels) Transfers: Sit to/from Stand, Bed to chair/wheelchair/BSC Sit to Stand: Max assist, Mod assist, +2 physical assistance (from bed and recliner)     Step pivot transfers: Min assist (+2 for safety)           Balance Overall balance assessment: Needs assistance Sitting-balance support: No upper extremity supported, Feet supported Sitting balance-Leahy Scale: Fair   Postural control: Posterior lean Standing balance support: Bilateral upper extremity supported, During functional activity, Reliant on assistive device for balance Standing balance-Leahy Scale: Poor Standing balance comment: reliant on RW for support                           ADL either performed or assessed with clinical judgement   ADL Overall ADL's : Needs assistance/impaired  Lower Body Dressing Details (indicate cue type and reason): total A to doff foot brace and donn cam boot on RLE as well as total A to don left bedroom shoe   Toilet Transfer Details (indicate cue type and reason): Mod-Max A +2 sit<>stand from bed (slightly raised) and from recliner; with  min A with chair follow for ambulation, but min A +1 for stand pivots once up on feet at RW level                Extremity/Trunk Assessment Upper Extremity Assessment Upper Extremity Assessment: Overall WFL for tasks assessed            Vision Baseline Vision/History: 1 Wears glasses Patient Visual Report: No change from baseline Vision Assessment?: No apparent visual deficits         Communication Communication Communication: No apparent difficulties   Cognition Arousal: Alert Behavior During Therapy: WFL for tasks assessed/performed                                 Following commands: Impaired Following commands impaired: Follows one step commands with increased time      Cueing   Cueing Techniques: Verbal cues, Tactile cues        General Comments See PT note for vitals and ambulation    Pertinent Vitals/ Pain       Pain Assessment Pain Assessment: No/denies pain         Frequency  Min 2X/week        Progress Toward Goals  OT Goals(current goals can now be found in the care plan section)  Progress towards OT goals: Progressing toward goals  Acute Rehab OT Goals Patient Stated Goal: to go home, maybe Friday OT Goal Formulation: With patient Time For Goal Achievement: 05/14/23 Potential to Achieve Goals: Good  Plan      Co-evaluation    PT/OT/SLP Co-Evaluation/Treatment: Yes Reason for Co-Treatment: For patient/therapist safety;To address functional/ADL transfers PT goals addressed during session: Mobility/safety with mobility;Proper use of DME;Balance OT goals addressed during session: ADL's and self-care;Strengthening/ROM      AM-PAC OT "6 Clicks" Daily Activity     Outcome Measure   Help from another person eating meals?: None Help from another person taking care of personal grooming?: A Little Help from another person toileting, which includes using toliet, bedpan, or urinal?: A Lot Help from another person bathing  (including washing, rinsing, drying)?: A Lot Help from another person to put on and taking off regular upper body clothing?: A Little Help from another person to put on and taking off regular lower body clothing?: A Lot 6 Click Score: 16    End of Session Equipment Utilized During Treatment: Gait belt;Rolling walker (2 wheels)  OT Visit Diagnosis: Unsteadiness on feet (R26.81);Other abnormalities of gait and mobility (R26.89);Muscle weakness (generalized) (M62.81)   Activity Tolerance Patient tolerated treatment well (the most activity he has done in a while)   Patient Left in chair;with call bell/phone within reach;with chair alarm set   Nurse Communication Mobility status        Time: 1100-1147 OT Time Calculation (min): 47 min  Charges: OT General Charges $OT Visit: 1 Visit OT Treatments $Self Care/Home Management : 23-37 mins  Lindon Romp OT Acute Rehabilitation Services Office [phone]    Evette Georges 04/30/2023, 2:34 PM

## 2023-04-30 NOTE — Plan of Care (Signed)
  Problem: Education: Goal: Knowledge of General Education information will improve Description: Including pain rating scale, medication(s)/side effects and non-pharmacologic comfort measures Outcome: Progressing   Problem: Health Behavior/Discharge Planning: Goal: Ability to manage health-related needs will improve Outcome: Progressing   Problem: Clinical Measurements: Goal: Ability to maintain clinical measurements within normal limits will improve Outcome: Progressing Goal: Will remain free from infection Outcome: Progressing Goal: Diagnostic test results will improve Outcome: Progressing Goal: Cardiovascular complication will be avoided Outcome: Progressing   Problem: Activity: Goal: Risk for activity intolerance will decrease Outcome: Progressing   Problem: Nutrition: Goal: Adequate nutrition will be maintained Outcome: Progressing   Problem: Elimination: Goal: Will not experience complications related to bowel motility Outcome: Progressing   Problem: Pain Managment: Goal: General experience of comfort will improve and/or be controlled Outcome: Progressing   Problem: Safety: Goal: Ability to remain free from injury will improve Outcome: Progressing   Problem: Skin Integrity: Goal: Risk for impaired skin integrity will decrease Outcome: Progressing   Problem: Education: Goal: Ability to describe self-care measures that may prevent or decrease complications (Diabetes Survival Skills Education) will improve Outcome: Progressing Goal: Individualized Educational Video(s) Outcome: Progressing   Problem: Coping: Goal: Ability to adjust to condition or change in health will improve Outcome: Progressing   Problem: Fluid Volume: Goal: Ability to maintain a balanced intake and output will improve Outcome: Progressing   Problem: Health Behavior/Discharge Planning: Goal: Ability to identify and utilize available resources and services will improve Outcome:  Progressing Goal: Ability to manage health-related needs will improve Outcome: Progressing   Problem: Metabolic: Goal: Ability to maintain appropriate glucose levels will improve Outcome: Progressing   Problem: Nutritional: Goal: Maintenance of adequate nutrition will improve Outcome: Progressing Goal: Progress toward achieving an optimal weight will improve Outcome: Progressing   Problem: Skin Integrity: Goal: Risk for impaired skin integrity will decrease Outcome: Progressing   Problem: Tissue Perfusion: Goal: Adequacy of tissue perfusion will improve Outcome: Progressing   Problem: Education: Goal: Understanding of CV disease, CV risk reduction, and recovery process will improve Outcome: Progressing Goal: Individualized Educational Video(s) Outcome: Progressing   Problem: Activity: Goal: Ability to return to baseline activity level will improve Outcome: Progressing   Problem: Cardiovascular: Goal: Ability to achieve and maintain adequate cardiovascular perfusion will improve Outcome: Progressing Goal: Vascular access site(s) Level 0-1 will be maintained Outcome: Progressing   Problem: Health Behavior/Discharge Planning: Goal: Ability to safely manage health-related needs after discharge will improve Outcome: Progressing

## 2023-04-30 NOTE — Progress Notes (Signed)
 Physical Therapy Treatment Patient Details Name: Harold [name] MRN: [id number] DOB: 01-30-1956 Today's Date: 04/30/2023   History of Present Illness 68 y.o. male presents to [facility] 03/27/23 with productive cough and generalized weakness. Admitted with influenza A and cellulitis of R foot s/p hardware removal of the R ankle and application of wound vac on 2/10, wound vac removed 2/27. Left thigh pain, CT negative. Prior admit 11/24 for R ankle ORIF and in 12/24 for revision. S/p excisional debridement of right ankle on 2/12. Pt underwent central line placement on 04/07/2023 however he has been having issues with recurrent bleeding from the insertion site which has finally stopped. 04/17/23 pt with drop in sats requiring 2 L of O2 via [place], chest xray concerning for edema or possibly pneumonia.Significant PMH: HTN, HLD, CAD s/p PCI, COPD, DM2, CKD stage IIIb, PVD, gout, OSA, neuropathy, COPD, a-fib, CAD s/p percutaneous coronary angioplasty, angina, obesity    PT Comments  Pt with poor OOB tolerance due to discomfort and fatigue. PT had frank conversation about amount of endurance needed to go home and go to dialysis. Pt ultimately able to sit up for 1.5 hours before returning to bed. Pt requires maxAx2 for standing in [place] and contact guard for returning to bed. PT voiced concern with physician about discharge late Friday and ability to get up and go to dialysis on Saturday. Current plan for discharge home after dialysis in hospital on Saturday.     If plan is discharge home, recommend the following: A lot of help with walking and/or transfers;A lot of help with bathing/dressing/bathroom;Assistance with cooking/housework;Assist for transportation;Help with stairs or ramp for entrance   Can travel by private vehicle     Yes  Equipment Recommendations  BSC/3in1       Precautions / Restrictions Precautions Precautions: Fall Recall of Precautions/Restrictions: Impaired Precaution/Restrictions  Comments: Watch BP Required Braces or Orthoses: Other Brace Other Brace: R splint in bed, abdominal binder (Prn) Restrictions Weight Bearing Restrictions Per Provider Order: Yes RLE Weight Bearing Per Provider Order: Weight bearing as tolerated Other Position/Activity Restrictions: per chat with Dr Lajoyce Corners, 3/12 pt no longer needs CAM boot per Dr. Audrie Lia note 04/03/2023 and orders 04/03/2023 pt WBAT     Mobility  Bed Mobility Overal bed mobility: Needs Assistance Bed Mobility: Rolling, Supine to Sit, Sit to Supine Rolling: Contact guard assist   Supine to sit: Contact guard, Used rails Sit to supine: Contact guard assist, Used rails   General bed mobility comments: pt able to return LE to bed without assist and center himself in bed with cuing for bridging hips.    Transfers Overall transfer level: Needs assistance Equipment used: Rolling walker (2 wheels) Transfers: Sit to/from Stand, Bed to chair/wheelchair/BSC Sit to Stand: +2 physical assistance, Via lift equipment, Max assist   Step pivot transfers:  (+2 for safety)       General transfer comment: maxAx2 for standing from recliner to [facility] for transfer back to bed Transfer via Lift Equipment: St[place]  Ambulation/Gait Ambulation/Gait assistance: Min assist, Mod assist Gait Distance (Feet): 8 Feet (+20, +30) Assistive device: Rolling walker (2 wheels) Gait Pattern/deviations: Step-through pattern, Trunk flexed, Decreased step length - right, Decreased step length - left Gait velocity: reduced Gait velocity interpretation: <1.31 ft/sec, indicative of household ambulator   General Gait Details: grossly min A for steadying, increases to modA at end of 30 foot ambulation when pt asked to push himself. continues to have difficulty advancing RLE due to CAM boot and external rotation  of R LE,         Balance Overall balance assessment: Needs assistance Sitting-balance support: No upper extremity supported, Feet  supported Sitting balance-Leahy Scale: Fair   Postural control: Posterior lean Standing balance support: Bilateral upper extremity supported, During functional activity, Reliant on assistive device for balance Standing balance-Leahy Scale: Poor Standing balance comment: reliant on RW for support                            Communication Communication Communication: No apparent difficulties  Cognition Arousal: Alert Behavior During Therapy: WFL for tasks assessed/performed   PT - Cognitive impairments: Safety/Judgement                       PT - Cognition Comments: poor understanding of need to work on endurance Following commands: Impaired Following commands impaired: Follows one step commands with increased time    Cueing Cueing Techniques: Verbal cues, Tactile cues     General Comments General comments (skin integrity, edema, etc.): pt calling RN to get back to bed 20 min after PT/OT session, PT had frank conversation about building tolerance for out of bed even if it is uncomfortable, pt able to tolerate 1/12 hours of sitting up , VSS on RA      Pertinent Vitals/Pain Pain Assessment Pain Assessment: Faces Faces Pain Scale: Hurts even more Pain Location: generalized Pain Descriptors / Indicators: Discomfort, Grimacing Pain Intervention(s): Limited activity within patient's tolerance, Monitored during session, Repositioned     PT Goals (current goals can now be found in the care plan section) Acute Rehab PT Goals PT Goal Formulation: With patient Time For Goal Achievement: 05/01/23 Potential to Achieve Goals: Fair Progress towards PT goals: Not progressing toward goals - comment    Frequency    Min 1X/week           Co-evaluation   Reason for Co-Treatment: For patient/therapist safety;To address functional/ADL transfers PT goals addressed during session: Mobility/safety with mobility;Proper use of DME;Balance OT goals addressed during  session: ADL's and self-care;Strengthening/ROM      AM-PAC PT "6 Clicks" Mobility   Outcome Measure  Help needed turning from your back to your side while in a flat bed without using bedrails?: A Little Help needed moving from lying on your back to sitting on the side of a flat bed without using bedrails?: A Little Help needed moving to and from a bed to a chair (including a wheelchair)?: A Lot Help needed standing up from a chair using your arms (e.g., wheelchair or bedside chair)?: A Lot Help needed to walk in hospital room?: A Lot Help needed climbing 3-5 steps with a railing? : Total 6 Click Score: 13    End of Session Equipment Utilized During Treatment: Gait belt Activity Tolerance: Patient tolerated treatment well Patient left: with call bell/phone within reach;in bed Nurse Communication: Mobility status PT Visit Diagnosis: Other abnormalities of gait and mobility (R26.89);Unsteadiness on feet (R26.81);Muscle weakness (generalized) (M62.81);History of falling (Z91.81);Difficulty in walking, not elsewhere classified (R26.2)     Time: 1322-1340 PT Time Calculation (min) (ACUTE ONLY): 18 min  Charges:    $Gait Training: 8-22 mins $Therapeutic Activity: 8-22 mins PT General Charges $$ ACUTE PT VISIT: 1 Visit                     Bobi Daudelin B. Beverely Risen PT, DPT Acute Rehabilitation Services Please use secure chat or  Call  Office [phone]    Elon Alas Fleet 04/30/2023, 5:06 PM

## 2023-04-30 NOTE — Progress Notes (Signed)
 PROGRESS NOTE    Harold [name]  ZOX:[id number] DOB: 04-Feb-1956 DOA: 03/27/2023 PCP: [name], MD   Brief Narrative:  Patient is a 68 year old male with morbid obesity, OSA, DM2, HTN, HLD, CAD/stent, A-fib on Eliquis, carotid artery stenosis, CKD, chronic anemia, anxiety/depression, diabetic neuropathy, GERD, hypothyroidism, COPD, pulmonary hypertension.  Patient presented with fever, cough, myalgia and generalized weakness and was found to be positive for influenza A.  Chest x-ray was negative for infiltrates.  Workup done during the hospital stay revealed right lower extremity wound infection/osteomyelitis of right ankle status post removal of hardware.  Cultures grew Enterobacter cloacae and MRSE.  Right lower extremity wound is healing well.  Antibiotics were managed by ID.  Patient monitored off antibiotics for a week, ID eventually signed off on 04/29/2023.  Apparently, patient developed worsening renal function on antibiotics.  Patient is on intermittent dialysis for severe and dense ATN.  Nephrology input is appreciated.  Medically stable, pending placement or discharge home once outpatient dialysis transportation is arranged/afforded by the family.  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 -Chest x-ray did not reveal any infiltrates.    -Improved with symptomatic treatment.  Patient has been on room air for very long time.   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 -S/p right ankle hardware removal, I&D and wound VAC placement on 2/10 by Dr. Jena Gauss -S/p repeat debridement of the right ankle by Dr. Lajoyce Corners 04/02/2023.   -ID was consulted, initially on cefepime, subsequently on daptomycin and ertapenem, eventually antibiotics held due to worsening renal function.  Patient doing well without further signs of infection so ID eventually signed off on 04/29/2023.  No plan of antibiotics.   AKI on CKD stage IIIb / Nephrotic range proteinuria, uremia, Anion gap metabolic acidosis: -Baseline creatinine appears to be 1.9-2.2.   -Renal function has continued to progressively worsen -Nephrology following, renal biopsy deferred due to high risk of bleeding.  Patient was also on Plavix for recent stent for PAD  -TDC placed on 3/4, underwent first HD session and now is receiving intermittent dialysis.  Appreciate nephrology help.   Right facial droop -On 3/1 patient noted to have right facial droop, evaluated by neurology, MRI of the brain negative for acute stroke. -Currently on Plavix.   PAD -Seen by vascular surgery underwent aortogram, right leg angiogram, successful stenting of right superficial femoral artery on 04/01/2023.   -Continue Plavix, Zetia   Acute metabolic encephalopathy -Likely multifactorial, uremia, infection, drug-induced from possibility of cefepime induced encephalopathy, chronic worsening memory issues -Started on hemodialysis Continue Cymbalta, decreased Neurontin 04/26/2023: Encephalopathy resolved.   Type 2 diabetes mellitus / Diabetic neuropathy, POA: A1c 7 on 01/02/2023. PTA meds-Tresiba 60 units daily, Mounjaro.  Patient currently on  Semglee 40 units twice daily and SSI,  hypoglycemic in the range of 200s at times, will increase Semglee to 45 units and continue SSI.   Diabetic neuropathy -Gabapentin decreased due to worsening renal function. (PTA reportedly on gabapentin 1600 mg 3 times daily).  Currently on Cymbalta.   Hypokalemia/hypomagnesemia: resolved.   Paroxysmal A-fib with slow ventricular response: -HR noted to be in low 40s Coreg held, cardiology consulted.  Cardiology agreed with discontinuing Coreg but continuing amiodarone.  -CHADS-VASc of 4.  Continue Eliquis.   Essential hypertension Now that Coreg is discontinued, blood pressure is rising, will start on amlodipine.   Anemia of chronic disease: Baseline hemoglobin between 10.5 11.5.   -H&H stable, borderline   CAD/stent Carotid artery stenosis HLD Continue PTA meds -Plavix, Crestor and Eliquis.   Hypothyroidism Continue Synthroid   Obstructive sleep apnea CPAP at bedtime    Gout Continue allopurinol, colchicine   Orthostatic hypotension -Received brief IV fluids, continue abdominal binder -2D echo showed EF 55 to 60%, basal inferior hypokinesis, G2 DD   Generalized debility -PT had recommended SNF however patient will have to pay out-of-pocket which he is not able to afford.   Dizziness/vertigo/double vision: Intermittent complaints of dizziness.  MRI brain completed on 04/19/2023 was unremarkable.  Low blood pressure is considered to be the cause.  Bradycardia was also suspected as one of the cause, Coreg has been discontinued and his heart rate is improving.  Symptoms are improving.   Morbid obesity: Estimated body mass index is 40.14 kg/m as calculated from the following:   Height as of this encounter: 6\' 1"  (1.854 m).   Weight as of this encounter: 138 kg. Weight loss and diet modification counseled.  DVT prophylaxis: Place and maintain sequential compression device Start: 04/15/23 1549 SCDs Start: 04/02/23 1617   Code Status: Full Code  Family Communication:  None  present at bedside.  Plan of care discussed with patient in length and he/she verbalized understanding and agreed with it.  Status is: Inpatient Remains inpatient appropriate because: Medically stable, difficult discharge   Estimated body mass index is 37.81 kg/m as calculated from the following:   Height as of this encounter: 6\' 1"  (1.854 m).   Weight as of this encounter: 130 kg.  Pressure Injury 04/17/23 Ankle Right;Anterior Deep Tissue Pressure Injury - Purple or maroon localized area of discolored intact skin or blood-filled blister due to damage of underlying soft tissue from pressure and/or shear. (Active)  04/17/23 2040  Location: Ankle  Location Orientation: Right;Anterior  Staging: Deep Tissue Pressure Injury - Purple or maroon localized area of discolored intact skin or blood-filled blister due to damage of underlying soft tissue from pressure and/or shear.  Wound Description (Comments):   Present on Admission: Yes  Dressing Type Compression wrap 04/29/23 0755   Nutritional Assessment: Body mass index is 37.81 kg/m.Marland Kitchen Seen by dietician.  I agree with the assessment and plan as outlined below: Nutrition Status:        . Skin Assessment: I have examined the patient's skin and I agree with the wound assessment as performed by the wound care RN as outlined below: Pressure Injury 04/17/23 Ankle Right;Anterior Deep Tissue Pressure Injury - Purple or maroon localized area of discolored intact skin or blood-filled blister due to damage of underlying soft tissue from pressure and/or shear. (Active)  04/17/23 2040  Location: Ankle  Location Orientation: Right;Anterior  Staging: Deep Tissue Pressure Injury - Purple or maroon localized area of discolored intact skin or blood-filled blister  due to damage of underlying soft tissue from pressure and/or shear.  Wound Description (Comments):   Present on Admission: Yes  Dressing Type Compression wrap 04/29/23 0755    Consultants:   Cardiology  Procedures:  As above  Antimicrobials:  Anti-infectives (From admission, onward)    Start     Dose/Rate Route Frequency Ordered Stop   04/22/23 1300  vancomycin (VANCOCIN) IVPB 1000 mg/200 mL premix        1,000 mg 200 mL/hr over 60 Minutes Intravenous To Radiology 04/22/23 1110 04/22/23 1849   04/19/23 1000  ertapenem (INVANZ) 500 mg in sodium chloride 0.9 % 50 mL IVPB  Status:  Discontinued        500 mg 100 mL/hr over 30 Minutes Intravenous Daily 04/18/23 0931 04/18/23 1106   04/17/23 1800  ertapenem (INVANZ) 1 g in sodium chloride 0.9 % 100 mL IVPB  Status:  Discontinued        1 g 200 mL/hr over 30 Minutes Intravenous Daily 04/17/23 1354 04/18/23 0931   04/08/23 0000  daptomycin (CUBICIN) IVPB  Status:  Discontinued        700 mg Intravenous Every 24 hours 04/08/23 1147 04/08/23    04/08/23 0000  ceFEPime (MAXIPIME) IVPB  Status:  Discontinued        2 g Intravenous Every 12 hours 04/08/23 1147 04/18/23    04/08/23 0000  daptomycin (CUBICIN) IVPB  Status:  Discontinued        700 mg Intravenous Every 24 hours 04/08/23 1150 04/18/23    04/04/23 1400  DAPTOmycin (CUBICIN) IVPB 700 mg/166mL premix  Status:  Discontinued        700 mg 200 mL/hr over 30 Minutes Intravenous Daily 04/03/23 1435 04/18/23 1106   04/02/23 1715  ceFAZolin (ANCEF) IVPB 2g/100 mL premix  Status:  Discontinued        2 g 200 mL/hr over 30 Minutes Intravenous Every 8 hours 04/02/23 1616 04/02/23 1623   04/02/23 0600  ceFAZolin (ANCEF) IVPB 3g/150 mL premix  Status:  Discontinued        3 g 300 mL/hr over 30 Minutes Intravenous On call to O.R. 04/01/23 1700 04/02/23 1549   03/31/23 1015  ceFAZolin (ANCEF) IVPB 2g/100 mL premix        2 g 200 mL/hr over 30 Minutes Intravenous On call to O.R. 03/31/23 0921 03/31/23 1203   03/29/23 2200  vancomycin (VANCOREADY) IVPB 2000 mg/400 mL  Status:  Discontinued       Placed in "Followed by" Linked Group   2,000 mg 200 mL/hr over 120 Minutes Intravenous  Every 48 hours 03/27/23 2257 04/03/23 1435   03/28/23 1045  metroNIDAZOLE (FLAGYL) tablet 500 mg  Status:  Discontinued        500 mg Oral Every 12 hours 03/28/23 0955 04/03/23 0945   03/27/23 2330  vancomycin (VANCOCIN) 2,500 mg in sodium chloride 0.9 % 500 mL IVPB       Placed in "Followed by" Linked Group   2,500 mg 262.5 mL/hr over 120 Minutes Intravenous  Once 03/27/23 2257 03/28/23 0241   03/27/23 2300  metroNIDAZOLE (FLAGYL) IVPB 500 mg  Status:  Discontinued        500 mg 100 mL/hr over 60 Minutes Intravenous Every 12 hours 03/27/23 2247 03/28/23 0955   03/27/23 2300  ceFEPIme (MAXIPIME) 2 g in sodium chloride 0.9 % 100 mL IVPB  Status:  Discontinued        2 g 200 mL/hr over 30 Minutes Intravenous Every  12 hours 03/27/23 2257 04/17/23 1354   03/27/23 2100  ceFAZolin (ANCEF) IVPB 2g/100 mL premix        2 g 200 mL/hr over 30 Minutes Intravenous  Once 03/27/23 2058 03/27/23 2219         Subjective: Patient seen and examined.  No complaints at all.  Objective: Vitals:   04/30/23 0012 04/30/23 0015 04/30/23 0213 04/30/23 0437  BP: (!) 156/55   (!) 158/66  Pulse: (!) 38 (!) 55 (!) 54 (!) 56  Resp: 18  18 17   Temp: 97.7 F (36.5 C)   98.6 F (37 C)  TempSrc:    Axillary  SpO2: 100% 100% 100% 100%  Weight:    130 kg  Height:        Intake/Output Summary (Last 24 hours) at 04/30/2023 0747 Last data filed at 04/30/2023 0439 Gross per 24 hour  Intake 620 ml  Output 4300 ml  Net -3680 ml   Filed Weights   04/28/23 0500 04/29/23 0500 04/30/23 0437  Weight: (!) 138.1 kg (!) 138 kg 130 kg    Examination:  General exam: Appears calm and comfortable  Respiratory system: Clear to auscultation. Respiratory effort normal. Cardiovascular system: S1 & S2 heard, RRR. No JVD, murmurs, rubs, gallops or clicks.  +1-2 pitting edema bilateral lower extremity. Gastrointestinal system: Abdomen is nondistended, soft and nontender. No organomegaly or masses felt. Normal bowel sounds  heard. Central nervous system: Alert and oriented. No focal neurological deficits. Psychiatry: Judgement and insight appear normal. Mood & affect appropriate.    Data Reviewed: I have personally reviewed following labs and imaging studies  CBC: Recent Labs  Lab 04/24/23 0400 04/25/23 0442 04/26/23 0705 04/27/23 0530 04/28/23 0530  WBC 5.2 5.6 6.5 6.5 6.2  HGB 7.8* 8.0* 7.9* 7.7* 7.8*  HCT 22.5* 23.3* 22.7* 22.3* 23.3*  MCV 84.9 85.0 85.7 86.1 86.6  PLT 136* 154 171 172 181   Basic Metabolic Panel: Recent Labs  Lab 04/26/23 0705 04/27/23 0530 04/27/23 2145 04/28/23 0530 04/29/23 1015 04/30/23 0205  NA 134* 132* 131* 134* 136 134*  K 3.5 3.6 3.9 3.8 3.8 3.2*  CL 98 100 96* 100 100 97*  CO2 24 21* 19* 21* 23 27  GLUCOSE 137* 193* 257* 177* 159* 167*  BUN 63* 63* 69* 70* 74* 35*  CREATININE 8.00* 8.69* 9.02* 9.06* 9.31* 5.36*  CALCIUM 8.7* 8.4* 8.8* 8.5* 8.9 8.9  MG  --   --  2.1  --   --   --   PHOS 4.9* 5.3*  --  5.6* 5.9* 3.0   GFR: Estimated Creatinine Clearance: 18.4 mL/min (A) (by C-G formula based on SCr of 5.36 mg/dL (H)). Liver Function Tests: Recent Labs  Lab 04/26/23 0705 04/27/23 0530 04/28/23 0530 04/29/23 1015 04/30/23 0205  ALBUMIN 2.3* 2.3* 2.4* 2.5* 2.7*   No results for input(s): "LIPASE", "AMYLASE" in the last 168 hours. No results for input(s): "AMMONIA" in the last 168 hours. Coagulation Profile: No results for input(s): "INR", "PROTIME" in the last 168 hours. Cardiac Enzymes: Recent Labs  Lab 04/25/23 0442  CKTOTAL 187   BNP (last 3 results) No results for input(s): "PROBNP" in the last 8760 hours. HbA1C: No results for input(s): "HGBA1C" in the last 72 hours. CBG: Recent Labs  Lab 04/29/23 0531 04/29/23 1204 04/29/23 1757 04/30/23 0033 04/30/23 0633  GLUCAP 134* 138* 232* 130* 210*   Lipid Profile: No results for input(s): "CHOL", "HDL", "LDLCALC", "TRIG", "CHOLHDL", "LDLDIRECT" in the last 72 hours.  Thyroid Function  Tests: No results for input(s): "TSH", "T4TOTAL", "FREET4", "T3FREE", "THYROIDAB" in the last 72 hours. Anemia Panel: No results for input(s): "VITAMINB12", "FOLATE", "FERRITIN", "TIBC", "IRON", "RETICCTPCT" in the last 72 hours. Sepsis Labs: No results for input(s): "PROCALCITON", "LATICACIDVEN" in the last 168 hours.  No results found for this or any previous visit (from the past 240 hours).   Radiology Studies: CT HEAD WO CONTRAST ( ) Result Date: 04/29/2023 CLINICAL DATA:  68 year old male with vertigo. EXAM: CT HEAD WITHOUT CONTRAST TECHNIQUE: Contiguous axial images were obtained from the base of the skull through the vertex without intravenous contrast. RADIATION DOSE REDUCTION: This exam was performed according to the departmental dose-optimization program which includes automated exposure control, adjustment of the mA and/or kV according to patient size and/or use of iterative reconstruction technique. COMPARISON:  Brain MRI and head CT 04/19/2023 FINDINGS: Brain: No midline shift, ventriculomegaly, mass effect, evidence of mass lesion, intracranial hemorrhage or evidence of cortically based acute infarction. Gray-white differentiation stable and within normal limits for age. Vascular: No suspicious intracranial vascular hyperdensity. Extensive calcified atherosclerosis at the skull base. Skull: No acute osseous abnormality identified. Partially visible advanced upper cervical spine degeneration. Sinuses/Orbits: Visualized paranasal sinuses and mastoids are stable and well aerated. Tympanic cavities appear clear. Other: Stable orbit and scalp soft tissues. Some calcified scalp vessel atherosclerosis. IMPRESSION: 1. Stable and negative for age noncontrast CT appearance of the Brain. 2. Advanced calcified atherosclerosis. Electronically Signed   By: Odessa Fleming M.D.   On: 04/29/2023 15:41    Scheduled Meds:  allopurinol  50 mg Oral Daily   amiodarone  200 mg Oral BID   apixaban  5 mg Oral BID    atorvastatin  40 mg Oral Daily   Chlorhexidine Gluconate Cloth  6 each Topical Q0600   Chlorhexidine Gluconate Cloth  6 each Topical Q0600   clopidogrel  75 mg Oral Q breakfast   docusate sodium  100 mg Oral BID   DULoxetine  60 mg Oral Daily   ezetimibe  10 mg Oral Daily   gabapentin  300 mg Oral QHS   guaiFENesin  600 mg Oral BID   insulin aspart  0-9 Units Subcutaneous Q6H   insulin glargine  40 Units Subcutaneous Daily   levothyroxine  88 mcg Oral QAC breakfast   polyethylene glycol  17 g Oral Daily   sodium chloride flush  3 mL Intravenous Q12H   sodium chloride flush  3-10 mL Intravenous Q12H   tamsulosin  0.4 mg Oral Daily   Continuous Infusions:   LOS: 34 days   Hughie Closs, MD [facility]  04/30/2023, 7:47 AM   *Please note that this is a verbal dictation therefore any spelling or grammatical errors are due to the "[employer]agon [employer] One" system interpretation.  Please page via Amion and do not message via secure chat for urgent patient care matters. Secure chat can be used for non urgent patient care matters.  How to contact the [facility] Attending or Consulting provider 7A - 7P or covering provider during after hours 7P -7A, for this patient?  Check the care team in [facility] and look for a) attending/consulting [facility] provider listed and b) the [facility] team listed. Page or secure chat 7A-7P. Log into [email or web address] and use [facility] universal password to access. If you do not have the password, please contact the hospital operator. Locate the [facility] provider you are looking for under [facility] and page to a number that you can be directly reached.  If you still have difficulty reaching the provider, please page the [facility] (Director on Call) for the Hospitalists listed on amion for assistance.

## 2023-05-01 ENCOUNTER — Other Ambulatory Visit (HOSPITAL_COMMUNITY): Payer: Self-pay

## 2023-05-01 DIAGNOSIS — L02415 Cutaneous abscess of right lower limb: Secondary | ICD-10-CM | POA: Diagnosis not present

## 2023-05-01 LAB — RENAL FUNCTION PANEL
Albumin: 2.5 g/dL — ABNORMAL LOW (ref 3.5–5.0)
Anion gap: 11 (ref 5–15)
BUN: 44 mg/dL — ABNORMAL HIGH (ref 8–23)
CO2: 25 mmol/L (ref 22–32)
Calcium: 8.5 mg/dL — ABNORMAL LOW (ref 8.9–10.3)
Chloride: 99 mmol/L (ref 98–111)
Creatinine, Ser: 6.15 mg/dL — ABNORMAL HIGH (ref 0.61–1.24)
GFR, Estimated: 9 mL/min — ABNORMAL LOW (ref >60)
Glucose, Bld: 151 mg/dL — ABNORMAL HIGH (ref 70–99)
Phosphorus: 4.5 mg/dL (ref 2.5–4.6)
Potassium: 3.7 mmol/L (ref 3.5–5.1)
Sodium: 135 mmol/L (ref 135–145)

## 2023-05-01 LAB — GLUCOSE, CAPILLARY
Glucose-Capillary: 118 mg/dL — ABNORMAL HIGH (ref 70–99)
Glucose-Capillary: 118 mg/dL — ABNORMAL HIGH (ref 70–99)
Glucose-Capillary: 129 mg/dL — ABNORMAL HIGH (ref 70–99)
Glucose-Capillary: 131 mg/dL — ABNORMAL HIGH (ref 70–99)

## 2023-05-01 LAB — CBC
HCT: 23.7 % — ABNORMAL LOW (ref 39.0–52.0)
Hemoglobin: 7.9 g/dL — ABNORMAL LOW (ref 13.0–17.0)
MCH: 29.4 pg (ref 26.0–34.0)
MCHC: 33.3 g/dL (ref 30.0–36.0)
MCV: 88.1 fL (ref 80.0–100.0)
Platelets: 187 10*3/uL (ref 150–400)
RBC: 2.69 MIL/uL — ABNORMAL LOW (ref 4.22–5.81)
RDW: 16.2 % — ABNORMAL HIGH (ref 11.5–15.5)
WBC: 6.8 10*3/uL (ref 4.0–10.5)
nRBC: 0 % (ref 0.0–0.2)

## 2023-05-01 MED ORDER — AMLODIPINE BESYLATE 10 MG PO TABS: 10.0000 mg | ORAL_TABLET | Freq: Every day | ORAL | Status: DC

## 2023-05-01 MED ORDER — GERHARDT'S BUTT CREAM
TOPICAL_CREAM | Freq: Three times a day (TID) | CUTANEOUS | Status: DC
  Administered 2023-05-05: 1 via TOPICAL
  Filled 2023-05-01 (×2): qty 60

## 2023-05-01 NOTE — Progress Notes (Signed)
 Occupational Therapy Treatment Patient Details Name: CABELL LAZENBY MRN: [id number] DOB: [birth year] Today's Date: 05/01/2023   History of present illness 68 y.o. male presents to [facility] 03/27/23 with productive cough and generalized weakness. Admitted with influenza A and cellulitis of R foot s/p hardware removal of the R ankle and application of wound vac on 2/10, wound vac removed 2/27. Left thigh pain, CT negative. Prior admit 11/24 for R ankle ORIF and in 12/24 for revision. S/p excisional debridement of right ankle on 2/12. Pt underwent central line placement on 04/07/2023 however he has been having issues with recurrent bleeding from the insertion site which has finally stopped. 04/17/23 pt with drop in sats requiring 2 L of O2 via [place], chest xray concerning for edema or possibly pneumonia.Significant PMH: HTN, HLD, CAD s/p PCI, COPD, DM2, CKD stage IIIb, PVD, gout, OSA, neuropathy, COPD, a-fib, CAD s/p percutaneous coronary angioplasty, angina, obesity   OT comments  [facility] Patient OT treatment. Patient received in supine and eager to get to EOB. Patient able to get to EOB with CGA and use of bed ladder. BP checked while on EOB with 110/56. Abdominal binder 117/57 after grooming. Standing from EOB 74/43. Patient returned to EOB and BP increased to 95/56.  Patient perform transfer to recliner and BP check but patient needed to sit before reading. While seated on recliner BP increased to 93/56. BP at end of session while seated in recliner 92/57. Patient to continue to be followed by acute OT with [facility] program to address established goals to facilitate DC to next venue of care.       If plan is discharge home, recommend the following:  A lot of help with bathing/dressing/bathroom;Assistance with cooking/housework;Help with stairs or ramp for entrance;Assist for transportation;Two people to help with walking and/or transfers   Equipment Recommendations  Other (comment) (bariatric BSC)     Recommendations for Other Services      Precautions / Restrictions Precautions Precautions: Fall Recall of Precautions/Restrictions: Impaired Precaution/Restrictions Comments: Watch BP Required Braces or Orthoses: Other Brace Other Brace: R splint in bed, abdominal binder (Prn) Restrictions Weight Bearing Restrictions Per Provider Order: Yes RLE Weight Bearing Per Provider Order: Weight bearing as tolerated Other Position/Activity Restrictions: per chat with Dr Lajoyce Corners, 3/12 pt no longer needs CAM boot per Dr. Audrie Lia note 04/03/2023 and orders 04/03/2023 pt WBAT       Mobility Bed Mobility Overal bed mobility: Needs Assistance Bed Mobility: Supine to Sit     Supine to sit: Contact guard, HOB elevated, Used rails     General bed mobility comments: used bed ladder    Transfers Overall transfer level: Needs assistance Equipment used: Rolling walker (2 wheels) Transfers: Sit to/from Stand, Bed to chair/wheelchair/BSC Sit to Stand: From elevated surface, Min assist     Step pivot transfers: Min assist     General transfer comment: max assist for bed to recliner transfer     Balance Overall balance assessment: Needs assistance Sitting-balance support: No upper extremity supported, Feet supported Sitting balance-Leahy Scale: Fair Sitting balance - Comments: able to perform grooming tasks seated on EOB Postural control: Posterior lean Standing balance support: Bilateral upper extremity supported, During functional activity, Reliant on assistive device for balance Standing balance-Leahy Scale: Poor Standing balance comment: reliant on RW for support                           ADL either performed or assessed with clinical judgement  ADL Overall ADL's : Needs assistance/impaired Eating/Feeding: Set up   Grooming: Wash/dry hands;Wash/dry face;Oral care;Set up;Sitting Grooming Details (indicate cue type and reason): on EOB         Upper Body Dressing :  Supervision/safety;Sitting Upper Body Dressing Details (indicate cue type and reason): to donn gown for back     Toilet Transfer: Minimal assistance;Rolling walker (2 wheels) Toilet Transfer Details (indicate cue type and reason): simulated to recliner         Functional mobility during ADLs: Minimal assistance General ADL Comments: declined dressing    Extremity/Trunk Assessment              Vision       Perception     Praxis     Communication Communication Communication: No apparent difficulties   Cognition Arousal: Alert Behavior During Therapy: WFL for tasks assessed/performed                                 Following commands: Impaired Following commands impaired: Follows one step commands with increased time      Cueing   Cueing Techniques: Verbal cues, Tactile cues  Exercises      Shoulder Instructions       General Comments BP on EOB 110/56, seated on EOB with abdominal binder 117/57, standing from EOB 74/43, unable to get reading during transfer to recliner, seated in recliner 95/56, end of session 92/57    Pertinent Vitals/ Pain       Pain Assessment Pain Assessment: Faces Faces Pain Scale: Hurts a little bit Pain Location: generalized Pain Descriptors / Indicators: Discomfort, Grimacing Pain Intervention(s): Limited activity within patient's tolerance, Monitored during session, Repositioned  Home Living                                          Prior Functioning/Environment              Frequency  Min 2X/week        Progress Toward Goals  OT Goals(current goals can now be found in the care plan section)  Progress towards OT goals: Progressing toward goals  Acute Rehab OT Goals Patient Stated Goal: to go home OT Goal Formulation: With patient Time For Goal Achievement: 05/14/23 Potential to Achieve Goals: Good ADL Goals Pt Will Perform Grooming: with set-up;sitting Pt Will Perform Lower Body  Dressing: with min assist;with adaptive equipment;with caregiver independent in assisting;sitting/lateral leans;sit to/from stand Pt Will Transfer to Toilet: stand pivot transfer;with min assist;bedside commode Pt Will Perform Toileting - Clothing Manipulation and hygiene: with min assist;sitting/lateral leans;sit to/from stand Pt/caregiver will Perform Home Exercise Program: Increased strength;Both right and left upper extremity;With theraband;With written HEP provided Additional ADL Goal #1: Pt will be Mod I in and OOB for basic ADLs  Plan      Co-evaluation                 AM-PAC OT "6 Clicks" Daily Activity     Outcome Measure   Help from another person eating meals?: None Help from another person taking care of personal grooming?: A Little Help from another person toileting, which includes using toliet, bedpan, or urinal?: A Lot Help from another person bathing (including washing, rinsing, drying)?: A Lot Help from another person to put on and taking off regular upper body clothing?: A  Little Help from another person to put on and taking off regular lower body clothing?: A Lot 6 Click Score: 16    End of Session Equipment Utilized During Treatment: Gait belt;Rolling walker (2 wheels)  OT Visit Diagnosis: Unsteadiness on feet (R26.81);Other abnormalities of gait and mobility (R26.89);Muscle weakness (generalized) (M62.81)   Activity Tolerance Patient tolerated treatment well   Patient Left in chair;with call bell/phone within reach;with chair alarm set   Nurse Communication Mobility status        Time: 4098-1191 OT Time Calculation (min): 43 min  Charges: OT General Charges $OT Visit: 1 Visit OT Treatments $Self Care/Home Management : 8-22 mins $Therapeutic Activity: 23-37 mins  Alfonse Flavors, OTA Acute Rehabilitation Services  Office [phone]   Dewain Penning 05/01/2023, 2:39 PM

## 2023-05-01 NOTE — Progress Notes (Addendum)
 PROGRESS NOTE    Harold [name]  ZOX:[id number] DOB: [birth year] DOA: 03/27/2023 PCP: [name], MD   Brief Narrative:  Patient is a 68 year old male with morbid obesity, OSA, DM2, HTN, HLD, CAD/stent, A-fib on Eliquis, carotid artery stenosis, CKD, chronic anemia, anxiety/depression, diabetic neuropathy, GERD, hypothyroidism, COPD, pulmonary hypertension.  Patient presented with fever, cough, myalgia and generalized weakness and was found to be positive for influenza A.  Chest x-ray was negative for infiltrates.  Workup done during the hospital stay revealed right lower extremity wound infection/osteomyelitis of right ankle status post removal of hardware.  Cultures grew Enterobacter cloacae and MRSE.  Right lower extremity wound is healing well.  Antibiotics were managed by ID.  Patient monitored off antibiotics for a week, ID eventually signed off on 04/29/2023.  Apparently, patient developed worsening renal function on antibiotics.  Patient is on intermittent dialysis for severe and dense ATN.  Nephrology input is appreciated.  Medically stable, pending discharge home which is planned for Saturday, 05/03/2023.  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 -Chest x-ray did not reveal any infiltrates.   -Improved with symptomatic treatment.  Patient has  been on room air for very long time.   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 -S/p right ankle hardware removal, I&D and wound VAC placement on 2/10 by Dr. Jena Gauss -S/p repeat debridement of the right ankle by Dr. Lajoyce Corners 04/02/2023.   -ID was consulted, initially on cefepime, subsequently on daptomycin and ertapenem, eventually antibiotics held due to worsening renal function.  Patient doing well without further signs of infection so ID eventually signed off on 04/29/2023.  No plan of antibiotics.   AKI on CKD stage IIIb / Nephrotic range proteinuria, uremia, Anion gap metabolic acidosis: -Baseline creatinine appears to be 1.9-2.2.   -Renal function has continued to progressively worsen -Nephrology following, renal biopsy deferred due to high risk of bleeding.  Patient was also on Plavix for recent stent for PAD  -TDC placed on 3/4, underwent first HD session and now is receiving intermittent dialysis.  Appreciate nephrology help.  Plan for him to start outpatient dialysis at [facility] dialysis on Tuesday.   Right facial droop -On 3/1 patient noted to have right facial droop, evaluated by neurology, MRI of the brain negative for acute stroke. -Currently on Plavix.   PAD -Seen by vascular surgery underwent aortogram, right leg angiogram, successful stenting of right superficial femoral artery on 04/01/2023.   -Continue Plavix, Zetia   Acute metabolic encephalopathy -Likely multifactorial, uremia, infection, drug-induced from possibility of cefepime induced encephalopathy, chronic worsening memory issues -Started on hemodialysis Continue Cymbalta, decreased Neurontin 04/26/2023: Encephalopathy resolved.   Type 2 diabetes mellitus / Diabetic neuropathy, POA: A1c 7 on 01/02/2023.  PTA meds-Tresiba 60 units daily, Mounjaro.  Patient currently on  Semglee 45 units twice daily and SSI, blood sugar much better controlled now.    Diabetic neuropathy -Gabapentin decreased due to worsening renal function. (PTA reportedly on gabapentin 1600 mg 3 times daily).  Currently on Cymbalta.   Hypokalemia/hypomagnesemia: resolved.   Paroxysmal A-fib with slow ventricular response: -HR noted to be in low 40s Coreg held, cardiology consulted.  Cardiology agreed with discontinuing Coreg but continuing amiodarone.  -CHADS-VASc of 4.  Continue Eliquis.   Essential hypertension Coreg discontinued by cardiology blood pressure started rising, started on amlodipine 5 mg yesterday, blood pressure is still high, will increase to 10 mg today.    Addendum: I was informed by the nurse as well as OT that patient was found to have orthostatic positive hypotension x 2 today with a drop of systolic blood pressure to 77.  Due to this, I have discontinued amlodipine.   Anemia of chronic disease: Baseline hemoglobin between 10.5 11.5.   -H&H stable, borderline   CAD/stent Carotid artery stenosis HLD Continue PTA meds -Plavix, Crestor and Eliquis.   Hypothyroidism Continue Synthroid   Obstructive sleep apnea CPAP at bedtime    Gout Continue allopurinol, colchicine   Orthostatic hypotension -Received brief IV fluids, continue abdominal binder -2D echo showed EF 55 to 60%, basal inferior hypokinesis, G2 DD   Generalized debility -PT had recommended SNF however patient will have to pay out-of-pocket which he is not able to afford.   Dizziness/vertigo/double vision: Intermittent complaints of dizziness.  MRI brain completed on 04/19/2023 was unremarkable.  Low blood pressure is considered to be the cause.  Bradycardia was also suspected as one of the cause, Coreg has been discontinued and his heart rate is improving.  Symptoms have now resolved.   Morbid obesity: Estimated body mass index is 40.14 kg/m as calculated from the following:   Height as of this  encounter: 6\' 1"  (1.854 m).   Weight as of this encounter: 138 kg. Weight loss and diet modification counseled.  Disposition: Patient was medically ready for discharge 04/30/2023 however patient's spouse [name], who is the only other member living in the home was unavailable.  Per TOC, [name] will return in town on Friday.  However PT said "We need to practice wheelchair transfers with spouse which will likely not happen until late Friday. To set him up for success, it would be better if he could stay for dialysis on Saturday and discharge home from dialysis on Saturday" and based on their recommendations, we have now plan for him to be discharged on Saturday after dialysis.  DVT prophylaxis: Place and maintain sequential compression device Start: 04/15/23 1549 SCDs Start: 04/02/23 1617   Code Status: Full Code  Family Communication:  None present at bedside.  Plan of care discussed with patient in length and he/she verbalized understanding and agreed with it.  Status is: Inpatient Remains inpatient appropriate because: Medically stable but due to logistics, unavailability of the spouse at home, plan to discharge on Saturday.   Estimated body mass index is 37.46 kg/m as calculated from the following:   Height as of this encounter: 6\' 1"  (1.854 m).   Weight as of this encounter: 128.8 kg.  Pressure Injury 04/17/23 Ankle Right;Anterior Deep Tissue Pressure Injury - Purple or maroon localized area of discolored intact skin or blood-filled blister due to damage of underlying soft tissue from pressure and/or shear. (Active)  04/17/23 2040  Location: Ankle  Location Orientation: Right;Anterior  Staging:  Deep Tissue Pressure Injury - Purple or maroon localized area of discolored intact skin or blood-filled blister due to damage of underlying soft tissue from pressure and/or shear.  Wound Description (Comments):   Present on Admission: Yes  Dressing Type Compression wrap 04/29/23 0755    Nutritional Assessment: Body mass index is 37.46 kg/m.Marland Kitchen Seen by dietician.  I agree with the assessment and plan as outlined below: Nutrition Status:        . Skin Assessment: I have examined the patient's skin and I agree with the wound assessment as performed by the wound care RN as outlined below: Pressure Injury 04/17/23 Ankle Right;Anterior Deep Tissue Pressure Injury - Purple or maroon localized area of discolored intact skin or blood-filled blister due to damage of underlying soft tissue from pressure and/or shear. (Active)  04/17/23 2040  Location: Ankle  Location Orientation: Right;Anterior  Staging: Deep Tissue Pressure Injury - Purple or maroon localized area of discolored intact skin or blood-filled blister due to damage of underlying soft tissue from pressure and/or shear.  Wound Description (Comments):   Present on Admission: Yes  Dressing Type Compression wrap 04/29/23 0755    Consultants:  Cardiology  Procedures:  As above  Antimicrobials:  Anti-infectives (From admission, onward)    Start     Dose/Rate Route Frequency Ordered Stop   04/22/23 1300  vancomycin (VANCOCIN) IVPB 1000 mg/200 mL premix        1,000 mg 200 mL/hr over 60 Minutes Intravenous To Radiology 04/22/23 1110 04/22/23 1849   04/19/23 1000  ertapenem (INVANZ) 500 mg in sodium chloride 0.9 % 50 mL IVPB  Status:  Discontinued        500 mg 100 mL/hr over 30 Minutes Intravenous Daily 04/18/23 0931 04/18/23 1106   04/17/23 1800  ertapenem (INVANZ) 1 g in sodium chloride 0.9 % 100 mL IVPB  Status:  Discontinued        1 g 200 mL/hr over 30 Minutes Intravenous Daily 04/17/23 1354 04/18/23 0931   04/08/23 0000  daptomycin (CUBICIN) IVPB  Status:  Discontinued        700 mg Intravenous Every 24 hours 04/08/23 1147 04/08/23    04/08/23 0000  ceFEPime (MAXIPIME) IVPB  Status:  Discontinued        2 g Intravenous Every 12 hours 04/08/23 1147 04/18/23    04/08/23 0000  daptomycin (CUBICIN) IVPB   Status:  Discontinued        700 mg Intravenous Every 24 hours 04/08/23 1150 04/18/23    04/04/23 1400  DAPTOmycin (CUBICIN) IVPB 700 mg/132mL premix  Status:  Discontinued        700 mg 200 mL/hr over 30 Minutes Intravenous Daily 04/03/23 1435 04/18/23 1106   04/02/23 1715  ceFAZolin (ANCEF) IVPB 2g/100 mL premix  Status:  Discontinued        2 g 200 mL/hr over 30 Minutes Intravenous Every 8 hours 04/02/23 1616 04/02/23 1623   04/02/23 0600  ceFAZolin (ANCEF) IVPB 3g/150 mL premix  Status:  Discontinued        3 g 300 mL/hr over 30 Minutes Intravenous On call to O.R. 04/01/23 1700 04/02/23 1549   03/31/23 1015  ceFAZolin (ANCEF) IVPB 2g/100 mL premix        2 g 200 mL/hr over 30 Minutes Intravenous On call to O.R. 03/31/23 6213 03/31/23 1203   03/29/23 2200  vancomycin (VANCOREADY) IVPB 2000 mg/400 mL  Status:  Discontinued       Placed in "Followed by" Linked  Group   2,000 mg 200 mL/hr over 120 Minutes Intravenous Every 48 hours 03/27/23 2257 04/03/23 1435   03/28/23 1045  metroNIDAZOLE (FLAGYL) tablet 500 mg  Status:  Discontinued        500 mg Oral Every 12 hours 03/28/23 0955 04/03/23 0945   03/27/23 2330  vancomycin (VANCOCIN) 2,500 mg in sodium chloride 0.9 % 500 mL IVPB       Placed in "Followed by" Linked Group   2,500 mg 262.5 mL/hr over 120 Minutes Intravenous  Once 03/27/23 2257 03/28/23 0241   03/27/23 2300  metroNIDAZOLE (FLAGYL) IVPB 500 mg  Status:  Discontinued        500 mg 100 mL/hr over 60 Minutes Intravenous Every 12 hours 03/27/23 2247 03/28/23 0955   03/27/23 2300  ceFEPIme (MAXIPIME) 2 g in sodium chloride 0.9 % 100 mL IVPB  Status:  Discontinued        2 g 200 mL/hr over 30 Minutes Intravenous Every 12 hours 03/27/23 2257 04/17/23 1354   03/27/23 2100  ceFAZolin (ANCEF) IVPB 2g/100 mL premix        2 g 200 mL/hr over 30 Minutes Intravenous  Once 03/27/23 2058 03/27/23 2219         Subjective: Patient seen and examined in dialysis unit.  He has no  complaints.  He is fully aware of the plan to discharge on Saturday.  Objective: Vitals:   05/01/23 0738 05/01/23 0752 05/01/23 0800 05/01/23 0830  BP: (!) 146/58 (!) 145/59 (!) 158/65 (!) 155/70  Pulse: (!) 53 (!) 48 (!) 51 (!) 58  Resp: 16 18 14 17   Temp: (!) 97.5 F (36.4 C)     TempSrc: Oral     SpO2: 100% 100% 99% 100%  Weight: 128.8 kg     Height:        Intake/Output Summary (Last 24 hours) at 05/01/2023 0842 Last data filed at 04/30/2023 2125 Gross per 24 hour  Intake 13 ml  Output 425 ml  Net -412 ml   Filed Weights   04/30/23 0437 05/01/23 0559 05/01/23 0738  Weight: 130 kg 129 kg 128.8 kg    Examination:  General exam: Appears calm and comfortable  Respiratory system: Clear to auscultation. Respiratory effort normal. Cardiovascular system: S1 & S2 heard, RRR. No JVD, murmurs, rubs, gallops or clicks. No pedal edema. Gastrointestinal system: Abdomen is nondistended, soft and nontender. No organomegaly or masses felt. Normal bowel sounds heard. Central nervous system: Alert and oriented. No focal neurological deficits. Psychiatry: Judgement and insight appear normal. Mood & affect appropriate.   Data Reviewed: I have personally reviewed following labs and imaging studies  CBC: Recent Labs  Lab 04/25/23 0442 04/26/23 0705 04/27/23 0530 04/28/23 0530 05/01/23 0703  WBC 5.6 6.5 6.5 6.2 6.8  HGB 8.0* 7.9* 7.7* 7.8* 7.9*  HCT 23.3* 22.7* 22.3* 23.3* 23.7*  MCV 85.0 85.7 86.1 86.6 88.1  PLT 154 171 172 181 187   Basic Metabolic Panel: Recent Labs  Lab 04/27/23 0530 04/27/23 2145 04/28/23 0530 04/29/23 1015 04/30/23 0205 05/01/23 0704  NA 132* 131* 134* 136 134* 135  K 3.6 3.9 3.8 3.8 3.2* 3.7  CL 100 96* 100 100 97* 99  CO2 21* 19* 21* 23 27 25   GLUCOSE 193* 257* 177* 159* 167* 151*  BUN 63* 69* 70* 74* 35* 44*  CREATININE 8.69* 9.02* 9.06* 9.31* 5.36* 6.15*  CALCIUM 8.4* 8.8* 8.5* 8.9 8.9 8.5*  MG  --  2.1  --   --   --   --  PHOS 5.3*  --   5.6* 5.9* 3.0 4.5   GFR: Estimated Creatinine Clearance: 16 mL/min (A) (by C-G formula based on SCr of 6.15 mg/dL (H)). Liver Function Tests: Recent Labs  Lab 04/27/23 0530 04/28/23 0530 04/29/23 1015 04/30/23 0205 05/01/23 0704  ALBUMIN 2.3* 2.4* 2.5* 2.7* 2.5*   No results for input(s): "LIPASE", "AMYLASE" in the last 168 hours. No results for input(s): "AMMONIA" in the last 168 hours. Coagulation Profile: No results for input(s): "INR", "PROTIME" in the last 168 hours. Cardiac Enzymes: Recent Labs  Lab 04/25/23 0442  CKTOTAL 187   BNP (last 3 results) No results for input(s): "PROBNP" in the last 8760 hours. HbA1C: No results for input(s): "HGBA1C" in the last 72 hours. CBG: Recent Labs  Lab 04/30/23 0805 04/30/23 1151 04/30/23 1844 04/30/23 2353 05/01/23 0551  GLUCAP 214* 149* 187* 169* 131*   Lipid Profile: No results for input(s): "CHOL", "HDL", "LDLCALC", "TRIG", "CHOLHDL", "LDLDIRECT" in the last 72 hours. Thyroid Function Tests: No results for input(s): "TSH", "T4TOTAL", "FREET4", "T3FREE", "THYROIDAB" in the last 72 hours. Anemia Panel: No results for input(s): "VITAMINB12", "FOLATE", "FERRITIN", "TIBC", "IRON", "RETICCTPCT" in the last 72 hours. Sepsis Labs: No results for input(s): "PROCALCITON", "LATICACIDVEN" in the last 168 hours.  No results found for this or any previous visit (from the past 240 hours).   Radiology Studies: CT HEAD WO CONTRAST ( ) Result Date: 04/29/2023 CLINICAL DATA:  68 year old male with vertigo. EXAM: CT HEAD WITHOUT CONTRAST TECHNIQUE: Contiguous axial images were obtained from the base of the skull through the vertex without intravenous contrast. RADIATION DOSE REDUCTION: This exam was performed according to the departmental dose-optimization program which includes automated exposure control, adjustment of the mA and/or kV according to patient size and/or use of iterative reconstruction technique. COMPARISON:  Brain MRI and  head CT 04/19/2023 FINDINGS: Brain: No midline shift, ventriculomegaly, mass effect, evidence of mass lesion, intracranial hemorrhage or evidence of cortically based acute infarction. Gray-white differentiation stable and within normal limits for age. Vascular: No suspicious intracranial vascular hyperdensity. Extensive calcified atherosclerosis at the skull base. Skull: No acute osseous abnormality identified. Partially visible advanced upper cervical spine degeneration. Sinuses/Orbits: Visualized paranasal sinuses and mastoids are stable and well aerated. Tympanic cavities appear clear. Other: Stable orbit and scalp soft tissues. Some calcified scalp vessel atherosclerosis. IMPRESSION: 1. Stable and negative for age noncontrast CT appearance of the Brain. 2. Advanced calcified atherosclerosis. Electronically Signed   By: Odessa Fleming M.D.   On: 04/29/2023 15:41    Scheduled Meds:  allopurinol  50 mg Oral Daily   amiodarone  200 mg Oral BID   amLODipine  5 mg Oral Daily   apixaban  5 mg Oral BID   atorvastatin  40 mg Oral Daily   Chlorhexidine Gluconate Cloth  6 each Topical Q0600   clopidogrel  75 mg Oral Q breakfast   docusate sodium  100 mg Oral BID   DULoxetine  60 mg Oral Daily   ezetimibe  10 mg Oral Daily   gabapentin  300 mg Oral QHS   guaiFENesin  600 mg Oral BID   insulin aspart  0-9 Units Subcutaneous Q6H   insulin glargine  45 Units Subcutaneous Daily   levothyroxine  88 mcg Oral QAC breakfast   polyethylene glycol  17 g Oral Daily   sodium chloride flush  3 mL Intravenous Q12H   sodium chloride flush  3-10 mL Intravenous Q12H   tamsulosin  0.4 mg Oral Daily   Continuous  Infusions:   LOS: 35 days   Hughie Closs, MD [facility]  05/01/2023, 8:42 AM   *Please note that this is a verbal dictation therefore any spelling or grammatical errors are due to the "Dragon Medical One" system interpretation.  Please page via Amion and do not message via secure chat for urgent  patient care matters. Secure chat can be used for non urgent patient care matters.  How to contact the [facility] Attending or Consulting provider 7A - 7P or covering provider during after hours 7P -7A, for this patient?  Check the care team in [facility] and look for a) attending/consulting [facility] provider listed and b) the [facility] team listed. Page or secure chat 7A-7P. Log into [email or web address] and use [facility] universal password to access. If you do not have the password, please contact the hospital operator. Locate the [facility] provider you are looking for under [facility] and page to a number that you can be directly reached. If you still have difficulty reaching the provider, please page the [facility] (Director on Call) for the Hospitalists listed on amion for assistance.

## 2023-05-01 NOTE — Plan of Care (Signed)

## 2023-05-01 NOTE — Progress Notes (Signed)
 Received patient in bed to unit.  Alert and oriented.  Informed consent signed and in chart.   TX duration:3.5  Patient tolerated well.  Transported back to the room  Alert, without acute distress.  Hand-off given to patient's nurse.   Access used: LTDC Access issues: drsg change  Total UF removed: 3L Medication(s) given: post tx TDC heparin block   05/01/23 1124  Vitals  BP (!) 144/64  MAP (mmHg) 87  Pulse Rate (!) 55  ECG Heart Rate (!) 56  Resp 15  Oxygen Therapy  SpO2 100 %  During Treatment Monitoring  Blood Flow Rate (mL/min) 400 mL/min  Arterial Pressure (mmHg) -182.21 mmHg  Venous Pressure (mmHg) 173.93 mmHg  TMP (mmHg) 6.66 mmHg  Ultrafiltration Rate (mL/min) 361 mL/min  Dialysate Flow Rate (mL/min) 300 ml/min  Duration of HD Treatment -hour(s) 3.48 hour(s)  Cumulative Fluid Removed (mL) per Treatment  2991.6  HD Safety Checks Performed Yes  Intra-Hemodialysis Comments Tx completed  Dialysis Fluid Bolus Normal Saline  Bolus Amount (mL) 300 mL  Post Treatment  Dialyzer Clearance Lightly streaked  Liters Processed 84  Fluid Removed (mL) 3000 mL  Tolerated HD Treatment Yes  Hemodialysis Catheter Left Internal jugular Double lumen Permanent (Tunneled)  Placement Date/Time: 04/22/23 1444   Placed prior to admission: No  Serial / Lot #: [id number]  Expiration Date: 05/19/27  Time Out: Correct patient;Correct site;Correct procedure  Maximum sterile barrier precautions: Hand hygiene;Cap;Mask;Sterile gown...  Site Condition No complications  Catheter fill solution Heparin 1000 units/ml  Catheter fill volume (Arterial) 2.1 cc  Catheter fill volume (Venous) 2.1  Post treatment catheter status Capped and Clamped      Freddi Starr, RN [facility]

## 2023-05-01 NOTE — Progress Notes (Signed)
 Physical Therapy Treatment Patient Details Name: Harold [name] MRN: [id number] DOB: 12/14/1955 Today's Date: 05/01/2023   History of Present Illness 68 y.o. male presents to [facility] 03/27/23 with productive cough and generalized weakness. Admitted with influenza A and cellulitis of R foot s/p hardware removal of the R ankle and application of wound vac on 2/10, wound vac removed 2/27. Left thigh pain, CT negative. Prior admit 11/24 for R ankle ORIF and in 12/24 for revision. S/p excisional debridement of right ankle on 2/12. Pt underwent central line placement on 04/07/2023 however he has been having issues with recurrent bleeding from the insertion site which has finally stopped. 04/17/23 pt with drop in sats requiring 2 L of O2 via [place], chest xray concerning for edema or possibly pneumonia.Significant PMH: HTN, HLD, CAD s/p PCI, COPD, DM2, CKD stage IIIb, PVD, gout, OSA, neuropathy, COPD, a-fib, CAD s/p percutaneous coronary angioplasty, angina, obesity    PT Comments  STAR PT session: Session focused on theract to promote functional mobility and transfers to and from a w/c and wheelchair navigation to promote safe navigation in the home. Pt continues to display difficulty with positional changes as seen with drops in BP upon standing but is able to recover upon sitting for a couple of minutes and activity tolerance as seen with fatiguing after self-propulsion of the wheelchair after 15 ft. Pt displayed improvements in step pivot transfers and sit to stands by requiring less assistance to complete each task. Notified MD of BP drop and coordinated to order pt TED Hose for left leg. Will continue to follow acutely.  Pt's BP (with abdominal binder)  Supine: 132/56 (80) EOB: 113/53 (70) EOB after standing: 77/57 (65) EOB: 106/47 End recliner: 104/56 (71)      If plan is discharge home, recommend the following: A lot of help with walking and/or transfers;A lot of help with  bathing/dressing/bathroom;Assistance with cooking/housework;Assist for transportation;Help with stairs or ramp for entrance   Can travel by private vehicle     Yes  Equipment Recommendations  BSC/3in1    Recommendations for Other Services       Precautions / Restrictions Precautions Precautions: Fall Recall of Precautions/Restrictions: Impaired Precaution/Restrictions Comments: Watch BP Other Brace: abdominal binder (Prn) Restrictions Weight Bearing Restrictions Per Provider Order: No RLE Weight Bearing Per Provider Order: Weight bearing as tolerated Other Position/Activity Restrictions: per chat with Dr Lajoyce Corners, 3/12 pt no longer needs CAM boot per Dr. Audrie Lia note 04/03/2023 and orders 04/03/2023 pt WBAT     Mobility  Bed Mobility Overal bed mobility: Needs Assistance Bed Mobility: Supine to Sit     Supine to sit: Contact guard, HOB elevated, Used rails     General bed mobility comments: Pt required CGA for safety during trunk elevation    Transfers Overall transfer level: Needs assistance Equipment used: Rolling walker (2 wheels) Transfers: Sit to/from Stand, Bed to chair/wheelchair/BSC Sit to Stand: Min assist, From elevated surface   Step pivot transfers: Min assist, +2 safety/equipment       General transfer comment: Pt required min A to power up from lower w/c height. Performs with increased fwd trunk and use of anterior momentum. Step pivot x2 for safety and removal of equipment. Pt performed x5 sit to stands    Ambulation/Gait               General Gait Details: deferred today s/t BP drop and reported dizziness   Stairs  Merchant navy officer mobility: Yes Wheelchair propulsion: Both upper extremities Wheelchair parts: Needs assistance Distance: 15 Wheelchair Assistance Details (indicate cue type and reason): Pt required cueing for which part of the w/c to hold to propel the chair and how to apply  pressure to assist in turning to avoid obstacles. Pt fatigued after 15 ft   Tilt Bed    Modified Rankin (Stroke Patients Only)       Balance Overall balance assessment: Needs assistance Sitting-balance support: No upper extremity supported, Feet supported Sitting balance-Leahy Scale: Fair Sitting balance - Comments: Pt sits EOB with no LOB Postural control: Posterior lean Standing balance support: Bilateral upper extremity supported, During functional activity, Reliant on assistive device for balance Standing balance-Leahy Scale: Poor Standing balance comment: Pt reliant on RW. Reports of dizziness and lightheadedness upon each attempt at standing. No LOB                            Communication Communication Communication: No apparent difficulties  Cognition Arousal: Alert Behavior During Therapy: WFL for tasks assessed/performed                             Following commands: Impaired Following commands impaired: Follows one step commands inconsistently, Follows one step commands with increased time    Cueing Cueing Techniques: Verbal cues, Tactile cues  Exercises      General Comments General comments (skin integrity, edema, etc.): See PT comments for BP      Pertinent Vitals/Pain Pain Assessment Pain Assessment: Faces Faces Pain Scale: Hurts a little bit Pain Location: generalized Pain Descriptors / Indicators: Discomfort, Grimacing Pain Intervention(s): Monitored during session    Home Living                          Prior Function            PT Goals (current goals can now be found in the care plan section) Acute Rehab PT Goals Patient Stated Goal: Return home confidently PT Goal Formulation: With patient Time For Goal Achievement: 05/08/23 Potential to Achieve Goals: Fair Progress towards PT goals: Progressing toward goals    Frequency    Min 1X/week      PT Plan      Co-evaluation               AM-PAC PT "6 Clicks" Mobility   Outcome Measure  Help needed turning from your back to your side while in a flat bed without using bedrails?: A Little Help needed moving from lying on your back to sitting on the side of a flat bed without using bedrails?: A Little Help needed moving to and from a bed to a chair (including a wheelchair)?: A Little Help needed standing up from a chair using your arms (e.g., wheelchair or bedside chair)?: A Little Help needed to walk in hospital room?: Total Help needed climbing 3-5 steps with a railing? : Total 6 Click Score: 14    End of Session Equipment Utilized During Treatment: Gait belt Activity Tolerance: Patient limited by fatigue Patient left: in chair;with call bell/phone within reach;with chair alarm set Nurse Communication: Mobility status PT Visit Diagnosis: Other abnormalities of gait and mobility (R26.89);Unsteadiness on feet (R26.81);Muscle weakness (generalized) (M62.81);History of falling (Z91.81);Difficulty in walking, not elsewhere classified (R26.2)     Time: 1521-1600 PT Time Calculation (min) (ACUTE  ONLY): 39 min  Charges:    $Therapeutic Activity: 23-37 mins $Wheel Chair Management: 8-22 mins PT General Charges $$ ACUTE PT VISIT: 1 Visit                     [address], SPT    [place] 05/01/2023, 6:38 PM

## 2023-05-01 NOTE — Procedures (Signed)
 Patient was seen on dialysis and the procedure was supervised.  BFR 400  Via TDC BP is  116/53.   Patient appears to be tolerating treatment well.  Mena Lienau Jaynie Collins 05/01/2023

## 2023-05-02 ENCOUNTER — Other Ambulatory Visit (HOSPITAL_COMMUNITY): Payer: Self-pay

## 2023-05-02 DIAGNOSIS — L02415 Cutaneous abscess of right lower limb: Secondary | ICD-10-CM | POA: Diagnosis not present

## 2023-05-02 LAB — GLUCOSE, CAPILLARY
Glucose-Capillary: 84 mg/dL (ref 70–99)
Glucose-Capillary: 92 mg/dL (ref 70–99)
Glucose-Capillary: 77 mg/dL (ref 70–99)
Glucose-Capillary: 224 mg/dL — ABNORMAL HIGH (ref 70–99)

## 2023-05-02 MED ORDER — DARBEPOETIN ALFA 200 MCG/0.4ML IJ SOSY
200.0000 ug | PREFILLED_SYRINGE | INTRAMUSCULAR | Status: DC
Start: 2023-05-02 — End: 2023-05-10
  Administered 2023-05-02 – 2023-05-09 (×2): 200 ug via SUBCUTANEOUS
  Filled 2023-05-02 (×2): qty 0.4

## 2023-05-02 MED ORDER — CHLORHEXIDINE GLUCONATE CLOTH 2 % EX PADS
6.0000 | MEDICATED_PAD | Freq: Every day | CUTANEOUS | Status: DC
  Administered 2023-05-03 – 2023-05-10 (×7): 6 via TOPICAL

## 2023-05-02 NOTE — Progress Notes (Addendum)
 Addendum:  Inadvertently entered wrong LTACH choice. Pt will benefit from PT at [facility] to be able to progress to discharge home when pt improves tolerance to upright and endurance.  Kutler Vanvranken B. Beverely Risen PT, DPT Acute Rehabilitation Services Please use secure chat or  Call Office [phone]   Physical Therapy Treatment Patient Details Name: Harold [name] MRN: [id number] DOB: Apr 06, 1955 Today's Date: 05/02/2023   History of Present Illness 68 y.o. male presents to [facility] 03/27/23 with productive cough and generalized weakness. Admitted with influenza A and cellulitis of R foot s/p hardware removal of the R ankle and application of wound vac on 2/10, wound vac removed 2/27. Left thigh pain, CT negative. Prior admit 11/24 for R ankle ORIF and in 12/24 for revision. S/p excisional debridement of right ankle on 2/12. Pt underwent central line placement on 04/07/2023 however he has been having issues with recurrent bleeding from the insertion site which has finally stopped. 04/17/23 pt with drop in sats requiring 2 L of O2 via [place], chest xray concerning for edema or possibly pneumonia.Significant PMH: HTN, HLD, CAD s/p PCI, COPD, DM2, CKD stage IIIb, PVD, gout, OSA, neuropathy, COPD, a-fib, CAD s/p percutaneous coronary angioplasty, angina, obesity    PT Comments  STAR PT/OT Session: Pt looking forward to going home tomorrow, however with therapy today concern raised for low symptomatic BP after 25 of ambulation and seated rest break BP 113/49 pt stands with minAx2 and pt has trouble achieving balance in RW and sits back down BP 100/49, after an additional 8 minutes of rest pt stands unable to maintain balance, sits back hard in the wheelchair where he starts to have UE tremors, and slurred speech. Pt wheeled back to room. PT voiced concern about what had happened. Pt states adamantly that he is going home tomorrow after dialysis. PT notified Attending, Nephrologist, and Case Manager about safe discharge  tomorrow. PT changing recommendation to [facility] for pt safety until BP can be better controlled.      If plan is discharge home, recommend the following: A lot of help with walking and/or transfers;A lot of help with bathing/dressing/bathroom;Assistance with cooking/housework;Assist for transportation;Help with stairs or ramp for entrance   Can travel by private vehicle     Yes  Equipment Recommendations  BSC/3in1       Precautions / Restrictions Precautions Precautions: Fall Recall of Precautions/Restrictions: Impaired Precaution/Restrictions Comments: Watch BP Required Braces or Orthoses: Other Brace Other Brace: abdominal binder (Prn)/ TED Hose on Restrictions Weight Bearing Restrictions Per Provider Order: Yes RLE Weight Bearing Per Provider Order: Weight bearing as tolerated Other Position/Activity Restrictions: per chat with Dr Lajoyce Corners, 3/12 pt no longer needs CAM boot per Dr. Audrie Lia note 04/03/2023 and orders 04/03/2023 pt WBAT     Mobility  Bed Mobility Overal bed mobility: Needs Assistance Bed Mobility: Supine to Sit     Supine to sit: Contact guard, HOB elevated, Used rails     General bed mobility comments: CGA with trunk    Transfers Overall transfer level: Needs assistance Equipment used: Rolling walker (2 wheels) Transfers: Sit to/from Stand, Bed to chair/wheelchair/BSC Sit to Stand: Min assist, From elevated surface   Step pivot transfers: Min assist, +2 safety/equipment       General transfer comment: min assist to stand from raised bed and to transfer to recliner. min assist +2 to stand from wheelchair with cues for hand placement and scooting hips forward    Ambulation/Gait Ambulation/Gait assistance: Min assist Gait Distance (Feet): 25  Feet Assistive device: Rolling walker (2 wheels) Gait Pattern/deviations: Step-through pattern, Trunk flexed, Decreased step length - right, Decreased step length - left Gait velocity: reduced Gait velocity  interpretation: <1.31 ft/sec, indicative of household ambulator   General Gait Details: initial gait strong with good foot clearance and upright posture, with distance is not able to correct posture due to fatigue   Psychologist, counselling mobility: Yes Wheelchair propulsion: Both upper extremities Wheelchair parts: Needs assistance Distance: 20 Wheelchair Assistance Details (indicate cue type and reason): pt requires cuing to for navigation around obstacles in hallway and reminder to lock breaks. Pt requiring rest break due to fatigue after 20 feet   Tilt Bed    Modified Rankin (Stroke Patients Only)       Balance Overall balance assessment: Needs assistance Sitting-balance support: No upper extremity supported, Feet supported Sitting balance-Leahy Scale: Fair Sitting balance - Comments: able to perform dressing seated on EOB Postural control: Posterior lean Standing balance support: Bilateral upper extremity supported, During functional activity, Reliant on assistive device for balance Standing balance-Leahy Scale: Poor Standing balance comment: Pt reliant on RW. Reports of dizziness and lightheadedness upon each attempt at standing.                            Communication Communication Communication: Impaired Factors Affecting Communication: Reduced clarity of speech (slurred speech at end of session)  Cognition Arousal: Alert Behavior During Therapy: WFL for tasks assessed/performed                           PT - Cognition Comments: continues to have poor understanding of the severity of his condition Following commands: Impaired Following commands impaired: Follows one step commands inconsistently, Follows one step commands with increased time    Cueing Cueing Techniques: Verbal cues, Tactile cues     General Comments General comments (skin integrity, edema, etc.): See OT note for BP       Pertinent Vitals/Pain Pain Assessment Pain Assessment: Faces Faces Pain Scale: Hurts a little bit Pain Location: generalized Pain Descriptors / Indicators: Discomfort, Grimacing Pain Intervention(s): Limited activity within patient's tolerance, Monitored during session, Repositioned     PT Goals (current goals can now be found in the care plan section) Acute Rehab PT Goals PT Goal Formulation: With patient Time For Goal Achievement: 05/08/23 Potential to Achieve Goals: Fair Progress towards PT goals: Not progressing toward goals - comment    Frequency    Min 1X/week           Co-evaluation PT/OT/SLP Co-Evaluation/Treatment: Yes Reason for Co-Treatment: For patient/therapist safety;To address functional/ADL transfers PT goals addressed during session: Mobility/safety with mobility;Proper use of DME;Balance OT goals addressed during session: ADL's and self-care;Strengthening/ROM      AM-PAC PT "6 Clicks" Mobility   Outcome Measure  Help needed turning from your back to your side while in a flat bed without using bedrails?: A Little Help needed moving from lying on your back to sitting on the side of a flat bed without using bedrails?: A Little Help needed moving to and from a bed to a chair (including a wheelchair)?: A Little Help needed standing up from a chair using your arms (e.g., wheelchair or bedside chair)?: A Lot Help needed to walk in hospital room?: Total Help needed climbing 3-5 steps with a railing? :  Total 6 Click Score: 13    End of Session Equipment Utilized During Treatment: Gait belt Activity Tolerance: Patient limited by fatigue Patient left: in chair;with call bell/phone within reach;with chair alarm set Nurse Communication: Mobility status PT Visit Diagnosis: Other abnormalities of gait and mobility (R26.89);Unsteadiness on feet (R26.81);Muscle weakness (generalized) (M62.81);History of falling (Z91.81);Difficulty in walking, not elsewhere  classified (R26.2)     Time: 8657-8469 PT Time Calculation (min) (ACUTE ONLY): 42 min  Charges:    $Gait Training: 8-22 mins PT General Charges $$ ACUTE PT VISIT: 1 Visit                     Achaia Garlock B. Beverely Risen PT, DPT Acute Rehabilitation Services Please use secure chat or  Call Office [phone]    [facility] 05/02/2023, 3:55 PM

## 2023-05-02 NOTE — Plan of Care (Signed)
  Problem: Education: Goal: Knowledge of General Education information will improve Description: Including pain rating scale, medication(s)/side effects and non-pharmacologic comfort measures Outcome: Progressing   Problem: Health Behavior/Discharge Planning: Goal: Ability to manage health-related needs will improve Outcome: Progressing   Problem: Clinical Measurements: Goal: Ability to maintain clinical measurements within normal limits will improve Outcome: Progressing Goal: Will remain free from infection Outcome: Progressing Goal: Diagnostic test results will improve Outcome: Progressing Goal: Cardiovascular complication will be avoided Outcome: Progressing   Problem: Activity: Goal: Risk for activity intolerance will decrease Outcome: Progressing   Problem: Nutrition: Goal: Adequate nutrition will be maintained Outcome: Progressing   Problem: Elimination: Goal: Will not experience complications related to bowel motility Outcome: Progressing   Problem: Pain Managment: Goal: General experience of comfort will improve and/or be controlled Outcome: Progressing   Problem: Safety: Goal: Ability to remain free from injury will improve Outcome: Progressing   Problem: Skin Integrity: Goal: Risk for impaired skin integrity will decrease Outcome: Progressing

## 2023-05-02 NOTE — Progress Notes (Signed)
 Occupational Therapy Treatment Patient Details Name: Harold [name] MRN: [id number] DOB: 01-17-1956 Today's Date: 05/02/2023   History of present illness 68 y.o. male presents to [facility] 03/27/23 with productive cough and generalized weakness. Admitted with influenza A and cellulitis of R foot s/p hardware removal of the R ankle and application of wound vac on 2/10, wound vac removed 2/27. Left thigh pain, CT negative. Prior admit 11/24 for R ankle ORIF and in 12/24 for revision. S/p excisional debridement of right ankle on 2/12. Pt underwent central line placement on 04/07/2023 however he has been having issues with recurrent bleeding from the insertion site which has finally stopped. 04/17/23 pt with drop in sats requiring 2 L of O2 via [place], chest xray concerning for edema or possibly pneumonia.Significant PMH: HTN, HLD, CAD s/p PCI, COPD, DM2, CKD stage IIIb, PVD, gout, OSA, neuropathy, COPD, a-fib, CAD s/p percutaneous coronary angioplasty, angina, obesity   OT comments  [facility] Patient OT/PT session: Patient able to get to EOB with CGA with BP 104/43. Patient participated in dressing seated on EOB and stood to pull up clothing. Abdominal binder was donned over clothing and patient's BP was 118/76 seated. Patient performed step pivot transfer to wheelchair with min assist and BP was 113/49. Patient performed wheelchair mobility in hallway with verbal cues. Mobility performed to simulate home distance with patient able to ambulate approximately 25 feet before complaining of dizziness and fatigue. BP was 110/49. Patient rested and was willing to attempt further ambulation but immediately felt dizzy and sat back in chair with 100/51. Patient rested for about 3-5 minutes for attempting standing again with increased dizziness and BP 82/40. Patient returned to bedroom and assisted to recliner with BP seated in recliner at end of session. 105/51. Therapy voiced concerns over going home tomorrow with spouse and safety  with mobility and transfers. Patient to continue to be followed by acute OT to address established goals.        If plan is discharge home, recommend the following:  A lot of help with bathing/dressing/bathroom;Assistance with cooking/housework;Help with stairs or ramp for entrance;Assist for transportation;Two people to help with walking and/or transfers   Equipment Recommendations  Other (comment) (bariatric BSC)    Recommendations for Other Services      Precautions / Restrictions Precautions Precautions: Fall Recall of Precautions/Restrictions: Impaired Precaution/Restrictions Comments: Watch BP Required Braces or Orthoses: Other Brace Other Brace: abdominal binder (Prn) Restrictions Weight Bearing Restrictions Per Provider Order: Yes RLE Weight Bearing Per Provider Order: Weight bearing as tolerated Other Position/Activity Restrictions: per chat with Dr Lajoyce Corners, 3/12 pt no longer needs CAM boot per Dr. Audrie Lia note 04/03/2023 and orders 04/03/2023 pt WBAT       Mobility Bed Mobility Overal bed mobility: Needs Assistance Bed Mobility: Supine to Sit     Supine to sit: Contact guard, HOB elevated, Used rails     General bed mobility comments: CGA with trunk    Transfers Overall transfer level: Needs assistance Equipment used: Rolling walker (2 wheels) Transfers: Sit to/from Stand, Bed to chair/wheelchair/BSC Sit to Stand: Min assist, From elevated surface     Step pivot transfers: Min assist, +2 safety/equipment     General transfer comment: min assist to stand from raised bed and to transfer to recliner. min assist +2 to stand from wheelchair with cues for hand placement and scooting hips forward     Balance Overall balance assessment: Needs assistance Sitting-balance support: No upper extremity supported, Feet supported Sitting balance-Leahy Scale: Fair Sitting  balance - Comments: able to perform dressing seated on EOB Postural control: Posterior lean Standing  balance support: Bilateral upper extremity supported, During functional activity, Reliant on assistive device for balance Standing balance-Leahy Scale: Poor Standing balance comment: Pt reliant on RW. Reports of dizziness and lightheadedness upon each attempt at standing.                           ADL either performed or assessed with clinical judgement   ADL Overall ADL's : Needs assistance/impaired                 Upper Body Dressing : Supervision/safety;Sitting Upper Body Dressing Details (indicate cue type and reason): to donn T-shirt seated on EOB Lower Body Dressing: Maximal assistance;Sitting/lateral leans;Sit to/from stand Lower Body Dressing Details (indicate cue type and reason): assistance with donning underwear, pants, and shoes               General ADL Comments: Patient's husband assists patient with dressing at home    Extremity/Trunk Assessment              Vision       Perception     Praxis     Communication Communication Communication: Impaired Factors Affecting Communication: Reduced clarity of speech (slurred speech at end of session)   Cognition Arousal: Alert Behavior During Therapy: [facility] for tasks assessed/performed                                 Following commands: Impaired Following commands impaired: Follows one step commands inconsistently, Follows one step commands with increased time      Cueing   Cueing Techniques: Verbal cues, Tactile cues  Exercises      Shoulder Instructions       General Comments See OT note for BP    Pertinent Vitals/ Pain       Pain Assessment Pain Assessment: Faces Faces Pain Scale: Hurts a little bit Pain Location: generalized Pain Descriptors / Indicators: Discomfort, Grimacing Pain Intervention(s): Monitored during session, Repositioned  Home Living                                          Prior Functioning/Environment               Frequency  Min 2X/week        Progress Toward Goals  OT Goals(current goals can now be found in the care plan section)  Progress towards OT goals: Progressing toward goals  Acute Rehab OT Goals Patient Stated Goal: to go home OT Goal Formulation: With patient Time For Goal Achievement: 05/14/23 Potential to Achieve Goals: Good ADL Goals Pt Will Perform Grooming: with set-up;sitting Pt Will Perform Lower Body Dressing: with min assist;with adaptive equipment;with caregiver independent in assisting;sitting/lateral leans;sit to/from stand Pt Will Transfer to Toilet: stand pivot transfer;with min assist;bedside commode Pt Will Perform Toileting - Clothing Manipulation and hygiene: with min assist;sitting/lateral leans;sit to/from stand Pt/caregiver will Perform Home Exercise Program: Increased strength;Both right and left upper extremity;With theraband;With written HEP provided Additional ADL Goal #1: Pt will be Mod I in and OOB for basic ADLs  Plan      Co-evaluation    PT/OT/SLP Co-Evaluation/Treatment: Yes Reason for Co-Treatment: For patient/therapist safety;To address functional/ADL transfers PT goals addressed during  session: Mobility/safety with mobility;Proper use of DME;Balance OT goals addressed during session: ADL's and self-care;Strengthening/ROM      AM-PAC OT "6 Clicks" Daily Activity     Outcome Measure   Help from another person eating meals?: None Help from another person taking care of personal grooming?: A Little Help from another person toileting, which includes using toliet, bedpan, or urinal?: A Lot Help from another person bathing (including washing, rinsing, drying)?: A Lot Help from another person to put on and taking off regular upper body clothing?: A Little Help from another person to put on and taking off regular lower body clothing?: A Lot 6 Click Score: 16    End of Session Equipment Utilized During Treatment: Gait belt;Rolling walker (2  wheels);Other (comment) (abdominal binder, wheelchair)  OT Visit Diagnosis: Unsteadiness on feet (R26.81);Other abnormalities of gait and mobility (R26.89);Muscle weakness (generalized) (M62.81)   Activity Tolerance Patient tolerated treatment well   Patient Left in chair;with call bell/phone within reach;with chair alarm set   Nurse Communication Mobility status        Time: 1610-9604 OT Time Calculation (min): 49 min  Charges: OT General Charges $OT Visit: 1 Visit OT Treatments $Self Care/Home Management : 8-22 mins $Therapeutic Activity: 8-22 mins  Alfonse Flavors, OTA Acute Rehabilitation Services  Office [phone]   Dewain Penning 05/02/2023, 3:19 PM

## 2023-05-02 NOTE — Progress Notes (Signed)
 PROGRESS NOTE    Harold [name]  ZOX:[id number] DOB: 08/08/1955 DOA: 03/27/2023 PCP: [name], MD   Brief Narrative:  Patient is a 68 year old male with morbid obesity, OSA, DM2, HTN, HLD, CAD/stent, A-fib on Eliquis, carotid artery stenosis, CKD, chronic anemia, anxiety/depression, diabetic neuropathy, GERD, hypothyroidism, COPD, pulmonary hypertension.  Patient presented with fever, cough, myalgia and generalized weakness and was found to be positive for influenza A.  Chest x-ray was negative for infiltrates.  Workup done during the hospital stay revealed right lower extremity wound infection/osteomyelitis of right ankle status post removal of hardware.  Cultures grew Enterobacter cloacae and MRSE.  Right lower extremity wound is healing well.  Antibiotics were managed by ID.  Patient monitored off antibiotics for a week, ID eventually signed off on 04/29/2023.  Apparently, patient developed worsening renal function on antibiotics.  Patient is on intermittent dialysis for severe and dense ATN.  Nephrology input is appreciated.  Medically stable, pending discharge home which is planned for Saturday, 05/03/2023.  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 -Chest x-ray did not reveal any infiltrates.   -Improved with symptomatic treatment.  Patient has  been on room air for very long time.   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 -S/p right ankle hardware removal, I&D and wound VAC placement on 2/10 by Dr. Jena Gauss -S/p repeat debridement of the right ankle by Dr. Lajoyce Corners 04/02/2023.   -ID was consulted, initially on cefepime, subsequently on daptomycin and ertapenem, eventually antibiotics held due to worsening renal function.  Patient doing well without further signs of infection so ID eventually signed off on 04/29/2023.  No plan of antibiotics.   AKI on CKD stage IIIb / Nephrotic range proteinuria, uremia, Anion gap metabolic acidosis: -Baseline creatinine appears to be 1.9-2.2.   -Renal function has continued to progressively worsen -Nephrology following, renal biopsy deferred due to high risk of bleeding.  Patient was also on Plavix for recent stent for PAD  -TDC placed on 3/4, underwent first HD session and now is receiving intermittent dialysis.  Appreciate nephrology help.  Plan for him to start outpatient dialysis at [facility] dialysis on Tuesday.   Right facial droop -On 3/1 patient noted to have right facial droop, evaluated by neurology, MRI of the brain negative for acute stroke. -Currently on Plavix.   PAD -Seen by vascular surgery underwent aortogram, right leg angiogram, successful stenting of right superficial femoral artery on 04/01/2023.   -Continue Plavix, Zetia   Acute metabolic encephalopathy -Likely multifactorial, uremia, infection, drug-induced from possibility of cefepime induced encephalopathy, chronic worsening memory issues -Started on hemodialysis Continue Cymbalta, decreased Neurontin 04/26/2023: Encephalopathy resolved.   Type 2 diabetes mellitus / Diabetic neuropathy, POA: A1c 7 on 01/02/2023.  PTA meds-Tresiba 60 units daily, Mounjaro.  Patient currently on  Semglee 45 units twice daily and SSI, blood sugar much better controlled now.    Diabetic neuropathy -Gabapentin decreased due to worsening renal function. (PTA reportedly on gabapentin 1600 mg 3 times daily).  Currently on Cymbalta.   Hypokalemia/hypomagnesemia: resolved.   Paroxysmal A-fib with slow ventricular response: -HR noted to be in low 40s, cardiology consulted, Coreg held, and they recommended continuing amiodarone.  Heart rate has improved but still in bradycardia range in 50s.   -CHADS-VASc of 4.     Essential hypertension Coreg discontinued by cardiology blood pressure started rising, started on amlodipine 5 mg but then patient had orthostatic hypotension so amlodipine was discontinued.  This has been an issue in the past as well.  We will allow slightly higher blood pressure.  Check orthostatics every shift.    Anemia of chronic disease: Baseline hemoglobin between 10.5 11.5.   -H&H stable, borderline   CAD/stent Carotid artery stenosis HLD Continue PTA meds -Plavix, Crestor and Eliquis.   Hypothyroidism Continue Synthroid   Obstructive sleep apnea CPAP at bedtime    Gout Continue allopurinol, colchicine   Orthostatic hypotension -Received brief IV fluids, continue abdominal binder -2D echo showed EF 55 to 60%, basal inferior hypokinesis, G2 DD   Generalized debility -PT had recommended SNF however patient will have to pay out-of-pocket which he is not able to afford.   Dizziness/vertigo/double vision: Intermittent complaints of dizziness.  MRI brain completed on 04/19/2023 was unremarkable.  Low blood pressure is considered to be the cause.  Bradycardia was also suspected as one of the cause, Coreg has been discontinued and his heart rate is improving.  Symptoms have now resolved.   Morbid obesity: Estimated body mass index is 40.14 kg/m as calculated from the following:   Height as of this encounter: 6\' 1"  (1.854 m).   Weight as of this encounter: 138  kg. Weight loss and diet modification counseled.  Disposition: Patient was medically ready for discharge 04/30/2023 however patient's spouse [name], who is the only other member living in the home was unavailable.  Per TOC, [name] will return in town on Friday.  However PT said "We need to practice wheelchair transfers with spouse which will likely not happen until late Friday. To set him up for success, it would be better if he could stay for dialysis on Saturday and discharge home from dialysis on Saturday" and based on their recommendations, we have now plan for him to be discharged on Saturday after dialysis.  DVT prophylaxis: Place TED hose Start: 05/01/23 1621 Place and maintain sequential compression device Start: 04/15/23 1549 SCDs Start: 04/02/23 1617   Code Status: Full Code  Family Communication:  None present at bedside.  Plan of care discussed with patient in length and he/she verbalized understanding and agreed with it.  Status is: Inpatient Remains inpatient appropriate because: Medically stable but due to logistics, unavailability of the spouse at home, plan to discharge on Saturday.   Estimated body mass index is 36.65 kg/m as calculated from the following:   Height as of this encounter: 6\' 1"  (1.854 m).   Weight as of this encounter: 126 kg.  Pressure Injury 04/17/23 Ankle Right;Anterior Deep Tissue Pressure Injury - Purple or maroon localized area of discolored intact skin or blood-filled blister due to damage of underlying soft tissue from pressure and/or shear. (Active)  04/17/23 2040  Location: Ankle  Location Orientation: Right;Anterior  Staging: Deep Tissue Pressure Injury - Purple  or maroon localized area of discolored intact skin or blood-filled blister due to damage of underlying soft tissue from pressure and/or shear.  Wound Description (Comments):   Present on Admission: Yes  Dressing Type Compression wrap 04/29/23 0755   Nutritional Assessment: Body mass  index is 36.65 kg/m.Marland Kitchen Seen by dietician.  I agree with the assessment and plan as outlined below: Nutrition Status:        . Skin Assessment: I have examined the patient's skin and I agree with the wound assessment as performed by the wound care RN as outlined below: Pressure Injury 04/17/23 Ankle Right;Anterior Deep Tissue Pressure Injury - Purple or maroon localized area of discolored intact skin or blood-filled blister due to damage of underlying soft tissue from pressure and/or shear. (Active)  04/17/23 2040  Location: Ankle  Location Orientation: Right;Anterior  Staging: Deep Tissue Pressure Injury - Purple or maroon localized area of discolored intact skin or blood-filled blister due to damage of underlying soft tissue from pressure and/or shear.  Wound Description (Comments):   Present on Admission: Yes  Dressing Type Compression wrap 04/29/23 0755    Consultants:  Cardiology  Procedures:  As above  Antimicrobials:  Anti-infectives (From admission, onward)    Start     Dose/Rate Route Frequency Ordered Stop   04/22/23 1300  vancomycin (VANCOCIN) IVPB 1000 mg/200 mL premix        1,000 mg 200 mL/hr over 60 Minutes Intravenous To Radiology 04/22/23 1110 04/22/23 1849   04/19/23 1000  ertapenem (INVANZ) 500 mg in sodium chloride 0.9 % 50 mL IVPB  Status:  Discontinued        500 mg 100 mL/hr over 30 Minutes Intravenous Daily 04/18/23 0931 04/18/23 1106   04/17/23 1800  ertapenem (INVANZ) 1 g in sodium chloride 0.9 % 100 mL IVPB  Status:  Discontinued        1 g 200 mL/hr over 30 Minutes Intravenous Daily 04/17/23 1354 04/18/23 0931   04/08/23 0000  daptomycin (CUBICIN) IVPB  Status:  Discontinued        700 mg Intravenous Every 24 hours 04/08/23 1147 04/08/23    04/08/23 0000  ceFEPime (MAXIPIME) IVPB  Status:  Discontinued        2 g Intravenous Every 12 hours 04/08/23 1147 04/18/23    04/08/23 0000  daptomycin (CUBICIN) IVPB  Status:  Discontinued        700 mg  Intravenous Every 24 hours 04/08/23 1150 04/18/23    04/04/23 1400  DAPTOmycin (CUBICIN) IVPB 700 mg/165mL premix  Status:  Discontinued        700 mg 200 mL/hr over 30 Minutes Intravenous Daily 04/03/23 1435 04/18/23 1106   04/02/23 1715  ceFAZolin (ANCEF) IVPB 2g/100 mL premix  Status:  Discontinued        2 g 200 mL/hr over 30 Minutes Intravenous Every 8 hours 04/02/23 1616 04/02/23 1623   04/02/23 0600  ceFAZolin (ANCEF) IVPB 3g/150 mL premix  Status:  Discontinued        3 g 300 mL/hr over 30 Minutes Intravenous On call to O.R. 04/01/23 1700 04/02/23 1549   03/31/23 1015  ceFAZolin (ANCEF) IVPB 2g/100 mL premix        2 g 200 mL/hr over 30 Minutes Intravenous On call to O.R. 03/31/23 6578 03/31/23 1203   03/29/23 2200  vancomycin (VANCOREADY) IVPB 2000 mg/400 mL  Status:  Discontinued       Placed in "Followed by" Linked Group   2,000 mg 200  mL/hr over 120 Minutes Intravenous Every 48 hours 03/27/23 2257 04/03/23 1435   03/28/23 1045  metroNIDAZOLE (FLAGYL) tablet 500 mg  Status:  Discontinued        500 mg Oral Every 12 hours 03/28/23 0955 04/03/23 0945   03/27/23 2330  vancomycin (VANCOCIN) 2,500 mg in sodium chloride 0.9 % 500 mL IVPB       Placed in "Followed by" Linked Group   2,500 mg 262.5 mL/hr over 120 Minutes Intravenous  Once 03/27/23 2257 03/28/23 0241   03/27/23 2300  metroNIDAZOLE (FLAGYL) IVPB 500 mg  Status:  Discontinued        500 mg 100 mL/hr over 60 Minutes Intravenous Every 12 hours 03/27/23 2247 03/28/23 0955   03/27/23 2300  ceFEPIme (MAXIPIME) 2 g in sodium chloride 0.9 % 100 mL IVPB  Status:  Discontinued        2 g 200 mL/hr over 30 Minutes Intravenous Every 12 hours 03/27/23 2257 04/17/23 1354   03/27/23 2100  ceFAZolin (ANCEF) IVPB 2g/100 mL premix        2 g 200 mL/hr over 30 Minutes Intravenous  Once 03/27/23 2058 03/27/23 2219         Subjective: Patient seen and examined.  He has no complaints.  He is aware of the plan of discharge tomorrow.   He also clarified that his partner/George will come today for the teaching.  Objective: Vitals:   05/01/23 1429 05/01/23 1934 05/02/23 0327 05/02/23 0614  BP: (!) 154/64 (!) 140/59 (!) 140/57   Pulse: (!) 57 (!) 59 (!) 54   Resp: 18 16 20    Temp: 98.4 F (36.9 C) 98.7 F (37.1 C) 98.3 F (36.8 C)   TempSrc: Axillary Oral Oral   SpO2: 100% 100% 95%   Weight:    126 kg  Height:        Intake/Output Summary (Last 24 hours) at 05/02/2023 0755 Last data filed at 05/01/2023 2209 Gross per 24 hour  Intake 253 ml  Output 3667 ml  Net -3414 ml   Filed Weights   05/01/23 0738 05/01/23 1139 05/02/23 0614  Weight: 128.8 kg 125.8 kg 126 kg    Examination:  General exam: Appears calm and comfortable  Respiratory system: Clear to auscultation. Respiratory effort normal. Cardiovascular system: S1 & S2 heard, RRR. No JVD, murmurs, rubs, gallops or clicks. No pedal edema. Gastrointestinal system: Abdomen is nondistended, soft and nontender. No organomegaly or masses felt. Normal bowel sounds heard. Central nervous system: Alert and oriented. No focal neurological deficits. Extremities: Symmetric 5 x 5 power. Skin: No rashes, lesions or ulcers.  Psychiatry: Judgement and insight appear normal. Mood & affect appropriate.   Data Reviewed: I have personally reviewed following labs and imaging studies  CBC: Recent Labs  Lab 04/26/23 0705 04/27/23 0530 04/28/23 0530 05/01/23 0703  WBC 6.5 6.5 6.2 6.8  HGB 7.9* 7.7* 7.8* 7.9*  HCT 22.7* 22.3* 23.3* 23.7*  MCV 85.7 86.1 86.6 88.1  PLT 171 172 181 187   Basic Metabolic Panel: Recent Labs  Lab 04/27/23 0530 04/27/23 2145 04/28/23 0530 04/29/23 1015 04/30/23 0205 05/01/23 0704  NA 132* 131* 134* 136 134* 135  K 3.6 3.9 3.8 3.8 3.2* 3.7  CL 100 96* 100 100 97* 99  CO2 21* 19* 21* 23 27 25   GLUCOSE 193* 257* 177* 159* 167* 151*  BUN 63* 69* 70* 74* 35* 44*  CREATININE 8.69* 9.02* 9.06* 9.31* 5.36* 6.15*  CALCIUM 8.4* 8.8* 8.5*  8.9 8.9  8.5*  MG  --  2.1  --   --   --   --   PHOS 5.3*  --  5.6* 5.9* 3.0 4.5   GFR: Estimated Creatinine Clearance: 15.8 mL/min (A) (by C-G formula based on SCr of 6.15 mg/dL (H)). Liver Function Tests: Recent Labs  Lab 04/27/23 0530 04/28/23 0530 04/29/23 1015 04/30/23 0205 05/01/23 0704  ALBUMIN 2.3* 2.4* 2.5* 2.7* 2.5*   No results for input(s): "LIPASE", "AMYLASE" in the last 168 hours. No results for input(s): "AMMONIA" in the last 168 hours. Coagulation Profile: No results for input(s): "INR", "PROTIME" in the last 168 hours. Cardiac Enzymes: No results for input(s): "CKTOTAL", "CKMB", "CKMBINDEX", "TROPONINI" in the last 168 hours.  BNP (last 3 results) No results for input(s): "PROBNP" in the last 8760 hours. HbA1C: No results for input(s): "HGBA1C" in the last 72 hours. CBG: Recent Labs  Lab 05/01/23 0551 05/01/23 1221 05/01/23 1638 05/01/23 2346 05/02/23 0611  GLUCAP 131* 129* 118* 118* 84   Lipid Profile: No results for input(s): "CHOL", "HDL", "LDLCALC", "TRIG", "CHOLHDL", "LDLDIRECT" in the last 72 hours. Thyroid Function Tests: No results for input(s): "TSH", "T4TOTAL", "FREET4", "T3FREE", "THYROIDAB" in the last 72 hours. Anemia Panel: No results for input(s): "VITAMINB12", "FOLATE", "FERRITIN", "TIBC", "IRON", "RETICCTPCT" in the last 72 hours. Sepsis Labs: No results for input(s): "PROCALCITON", "LATICACIDVEN" in the last 168 hours.  No results found for this or any previous visit (from the past 240 hours).   Radiology Studies: No results found.   Scheduled Meds:  allopurinol  50 mg Oral Daily   amiodarone  200 mg Oral BID   apixaban  5 mg Oral BID   atorvastatin  40 mg Oral Daily   Chlorhexidine Gluconate Cloth  6 each Topical Q0600   clopidogrel  75 mg Oral Q breakfast   docusate sodium  100 mg Oral BID   DULoxetine  60 mg Oral Daily   ezetimibe  10 mg Oral Daily   gabapentin  300 mg Oral QHS   Gerhardt's butt cream   Topical TID    guaiFENesin  600 mg Oral BID   insulin aspart  0-9 Units Subcutaneous Q6H   insulin glargine  45 Units Subcutaneous Daily   levothyroxine  88 mcg Oral QAC breakfast   polyethylene glycol  17 g Oral Daily   sodium chloride flush  3 mL Intravenous Q12H   sodium chloride flush  3-10 mL Intravenous Q12H   tamsulosin  0.4 mg Oral Daily   Continuous Infusions:   LOS: 36 days   Hughie Closs, MD [facility]  05/02/2023, 7:55 AM   *Please note that this is a verbal dictation therefore any spelling or grammatical errors are due to the "Dragon Medical One" system interpretation.  Please page via Amion and do not message via secure chat for urgent patient care matters. Secure chat can be used for non urgent patient care matters.  How to contact the [facility] Attending or Consulting provider 7A - 7P or covering provider during after hours 7P -7A, for this patient?  Check the care team in [facility] and look for a) attending/consulting [facility] provider listed and b) the [facility] team listed. Page or secure chat 7A-7P. Log into [email or web address] and use [place]'s universal password to access. If you do not have the password, please contact the hospital operator. Locate the [facility] provider you are looking for under [facility] and page to a number that you can be directly reached. If you still have  difficulty reaching the provider, please page the [facility] (Director on Call) for the Hospitalists listed on amion for assistance.

## 2023-05-02 NOTE — Plan of Care (Signed)
   Problem: Education: Goal: Knowledge of General Education information will improve Description: Including pain rating scale, medication(s)/side effects and non-pharmacologic comfort measures Outcome: Progressing   Problem: Activity: Goal: Risk for activity intolerance will decrease Outcome: Progressing   Problem: Nutrition: Goal: Adequate nutrition will be maintained Outcome: Progressing   Problem: Elimination: Goal: Will not experience complications related to bowel motility Outcome: Progressing

## 2023-05-02 NOTE — Care Management Important Message (Signed)
 Important Message  Patient Details  Name: Harold [name] MRN: [id number] Date of Birth: August 13, 1955   Important Message Given:  Yes - Medicare IM     Renie Ora 05/02/2023, 10:51 AM

## 2023-05-02 NOTE — Progress Notes (Signed)
 [facility] NEPHROLOGY PROGRESS NOTE  Assessment/ Plan: Pt is a 68 y.o. yo male    # Dialysis dependent AKI on CKD 3; ATN and AIN both possible. -CLIP as AKI to [place] start 3/11 if needed. -Status post dialysis yesterday with 3 L UF, tolerated well and feels great.  He is nonoliguric - BUN and crt rising more slowly in between tx.  Plan for next HD tomorrow and continue outpatient schedule ( TTS) but continue to watch for signs of recovery   # Hypertension with tendency towards orthostasis on low-dose carvedilol and tamsulosin. Ease off on UF as seems euvolemic today   # Nephrotic range proteinuria with negative serological evaluation but decision against biopsy at the current time per previous notes.  # Right lower extremity infection with involvement of orthopedic hardware status post removal and antibiotics discontinued 2/28 with concern for AIN.  #Chronic anemia, hemoglobin in the 7s.check iron.   #Mild hyponatremia, continue to limit free water intake.  Corrected with more UF with HD  # Increased anion gap metabolic acidosis, managed with dialysis.  Anemia-  check iron and add ESA   Planning for tentative discharge tomorrow after HD-  will try for first shift  Subjective:   HD yest-  removed 3 liters- no labs this AM-  he said it was rough, looks like BP dropped after   Objective Vital signs in last 24 hours: Vitals:   05/01/23 1934 05/02/23 0327 05/02/23 0614 05/02/23 0754  BP: (!) 140/59 (!) 140/57  (!) 144/52  Pulse: (!) 59 (!) 54  (!) 54  Resp: 16 20  18   Temp: 98.7 F (37.1 C) 98.3 F (36.8 C)  98.5 F (36.9 C)  TempSrc: Oral Oral  Axillary  SpO2: 100% 95%  100%  Weight:   126 kg   Height:       Weight change: -0.203 kg  Intake/Output Summary (Last 24 hours) at 05/02/2023 1024 Last data filed at 05/01/2023 2209 Gross per 24 hour  Intake 253 ml  Output 3667 ml  Net -3414 ml       Labs: RENAL PANEL Recent Labs  Lab 04/27/23 0530  04/27/23 2145 04/28/23 0530 04/29/23 1015 04/30/23 0205 05/01/23 0704  NA 132* 131* 134* 136 134* 135  K 3.6 3.9 3.8 3.8 3.2* 3.7  CL 100 96* 100 100 97* 99  CO2 21* 19* 21* 23 27 25   GLUCOSE 193* 257* 177* 159* 167* 151*  BUN 63* 69* 70* 74* 35* 44*  CREATININE 8.69* 9.02* 9.06* 9.31* 5.36* 6.15*  CALCIUM 8.4* 8.8* 8.5* 8.9 8.9 8.5*  MG  --  2.1  --   --   --   --   PHOS 5.3*  --  5.6* 5.9* 3.0 4.5  ALBUMIN 2.3*  --  2.4* 2.5* 2.7* 2.5*    Liver Function Tests: Recent Labs  Lab 04/29/23 1015 04/30/23 0205 05/01/23 0704  ALBUMIN 2.5* 2.7* 2.5*   No results for input(s): "LIPASE", "AMYLASE" in the last 168 hours. No results for input(s): "AMMONIA" in the last 168 hours. CBC: Recent Labs    03/28/23 0810 03/29/23 0407 04/25/23 0442 04/26/23 0705 04/27/23 0530 04/28/23 0530 05/01/23 0703  HGB  --    < > 8.0* 7.9* 7.7* 7.8* 7.9*  MCV  --    < > 85.0 85.7 86.1 86.6 88.1  VITAMINB12 305  --   --   --   --   --   --    < > =  values in this interval not displayed.    Cardiac Enzymes: No results for input(s): "CKTOTAL", "CKMB", "CKMBINDEX", "TROPONINI" in the last 168 hours.  CBG: Recent Labs  Lab 05/01/23 1221 05/01/23 1638 05/01/23 2346 05/02/23 0611 05/02/23 0755  GLUCAP 129* 118* 118* 84 92    Iron Studies: No results for input(s): "IRON", "TIBC", "TRANSFERRIN", "FERRITIN" in the last 72 hours. Studies/Results: No results found.   Medications: Infusions:    Scheduled Medications:  allopurinol  50 mg Oral Daily   amiodarone  200 mg Oral BID   apixaban  5 mg Oral BID   atorvastatin  40 mg Oral Daily   Chlorhexidine Gluconate Cloth  6 each Topical Q0600   clopidogrel  75 mg Oral Q breakfast   docusate sodium  100 mg Oral BID   DULoxetine  60 mg Oral Daily   ezetimibe  10 mg Oral Daily   gabapentin  300 mg Oral QHS   Gerhardt's butt cream   Topical TID   guaiFENesin  600 mg Oral BID   insulin aspart  0-9 Units Subcutaneous Q6H   insulin  glargine  45 Units Subcutaneous Daily   levothyroxine  88 mcg Oral QAC breakfast   polyethylene glycol  17 g Oral Daily   sodium chloride flush  3 mL Intravenous Q12H   sodium chloride flush  3-10 mL Intravenous Q12H   tamsulosin  0.4 mg Oral Daily    have reviewed scheduled and prn medications.  Physical Exam: General:NAD, comfortable Heart:RRR, s1s2 nl Lungs: Reduced breath sound bilateral basal Abdomen:soft, Non-tender, distended Extremities:no edema  Dialysis Access: TDC in place  Haniya Fern A Reann Dobias 05/02/2023,10:24 AM  LOS: 36 days

## 2023-05-02 NOTE — TOC Progression Note (Addendum)
 Transition of Care [facility]) - Progression Note    Patient Details  Name: Harold [name] MRN: [id number] Date of Birth: 1955/11/20  Transition of Care [facility]) CM/SW Contact  Graves-Bigelow, Lamar Laundry, RN Phone Number: 05/02/2023, 12:10 PM  Clinical Narrative: Case Manager spoke with spouse regarding disposition plan of care. Spouse states he will be here at 1400 on Saturday 05-03-23 to transport the patient home via private vehicle. Patient will be scheduled for HD first rounds and scheduled for discharge afterwards. Case Manager did call liaison Artavia with [facility] to make her aware of discharge on Sat. No further needs identified at this time.      05-02-23 Patient has worked with therapy and during the session his BP dropped. Patient needs a safe transition. Unable to get into SNF with benefits due to out of SNF days. Case Manager discussed having FC complete the Medicaid Screen and the patient is agreeable. Patient is eligible for Medicaid Screen-information sent to [facility] for assistance. Case Manager also discussed LTAC with the patient and he is agreeable if the insurance will approve-MD is aware. Case Manager has reached out to [facility] and [facility] to see if the patient is eligible. Spouse is aware that the patient has been submitted to LTAC. Case Manager has contacted [facility] and [facility] to review the patient.  1627 05-02-23 Family chose [facility]- hospital liaison is aware and clinicals will be submitted on Monday. Spouse to call [facility] to make them aware.     Expected Discharge Plan: Home w Home Health Services Barriers to Discharge: No Barriers Identified  Expected Discharge Plan and Services In-house Referral: Clinical Social Work Discharge Planning Services: CM Consult Post Acute Care Choice: Home Health Living arrangements for the past 2 months: Single Family Home Expected Discharge Date: 04/10/23               DME Arranged: Bedside commode (bariatric) DME Agency:  Julianne Rice) Date  DME Agency Contacted: 04/29/23 Time DME Agency Contacted: [phone] Representative spoke with at DME Agency: Vaughan Basta HH Arranged: RN, Disease Management, PT, OT, Nurse's Aide HH Agency: [facility] ([facility])   Social Determinants of Health (SDOH) Interventions SDOH Screenings   Food Insecurity: No Food Insecurity (03/27/2023)  Housing: Low Risk  (03/28/2023)  Transportation Needs: No Transportation Needs (03/27/2023)  Utilities: Not At Risk (03/28/2023)  Social Connections: Unknown (03/28/2023)  Tobacco Use: Medium Risk (04/02/2023)    Readmission Risk Interventions     No data to display

## 2023-05-03 DIAGNOSIS — L02415 Cutaneous abscess of right lower limb: Secondary | ICD-10-CM | POA: Diagnosis not present

## 2023-05-03 LAB — CBC
HCT: 25.1 % — ABNORMAL LOW (ref 39.0–52.0)
Hemoglobin: 8.3 g/dL — ABNORMAL LOW (ref 13.0–17.0)
MCH: 29.6 pg (ref 26.0–34.0)
MCHC: 33.1 g/dL (ref 30.0–36.0)
MCV: 89.6 fL (ref 80.0–100.0)
Platelets: 195 10*3/uL (ref 150–400)
RBC: 2.8 MIL/uL — ABNORMAL LOW (ref 4.22–5.81)
RDW: 16.2 % — ABNORMAL HIGH (ref 11.5–15.5)
WBC: 8.5 10*3/uL (ref 4.0–10.5)
nRBC: 0 % (ref 0.0–0.2)

## 2023-05-03 LAB — RENAL FUNCTION PANEL
Albumin: 2.7 g/dL — ABNORMAL LOW (ref 3.5–5.0)
Anion gap: 15 (ref 5–15)
BUN: 40 mg/dL — ABNORMAL HIGH (ref 8–23)
CO2: 24 mmol/L (ref 22–32)
Calcium: 8.4 mg/dL — ABNORMAL LOW (ref 8.9–10.3)
Chloride: 96 mmol/L — ABNORMAL LOW (ref 98–111)
Creatinine, Ser: 5.35 mg/dL — ABNORMAL HIGH (ref 0.61–1.24)
GFR, Estimated: 11 mL/min — ABNORMAL LOW (ref >60)
Glucose, Bld: 109 mg/dL — ABNORMAL HIGH (ref 70–99)
Phosphorus: 4.5 mg/dL (ref 2.5–4.6)
Potassium: 3.7 mmol/L (ref 3.5–5.1)
Sodium: 135 mmol/L (ref 135–145)

## 2023-05-03 LAB — GLUCOSE, CAPILLARY
Glucose-Capillary: 117 mg/dL — ABNORMAL HIGH (ref 70–99)
Glucose-Capillary: 133 mg/dL — ABNORMAL HIGH (ref 70–99)
Glucose-Capillary: 209 mg/dL — ABNORMAL HIGH (ref 70–99)
Glucose-Capillary: 219 mg/dL — ABNORMAL HIGH (ref 70–99)

## 2023-05-03 LAB — IRON AND TIBC
Iron: 43 ug/dL — ABNORMAL LOW (ref 45–182)
Saturation Ratios: 23 % (ref 17.9–39.5)
TIBC: 190 ug/dL — ABNORMAL LOW (ref 250–450)
UIBC: 147 ug/dL

## 2023-05-03 LAB — FERRITIN: Ferritin: 179 ng/mL (ref 24–336)

## 2023-05-03 MED ORDER — ORAL CARE MOUTH RINSE: 15.0000 mL | OROMUCOSAL | Status: DC | PRN

## 2023-05-03 NOTE — Progress Notes (Signed)
 [facility] NEPHROLOGY PROGRESS NOTE  Assessment/ Plan: Pt is a 68 y.o. yo male    # Dialysis dependent AKI on CKD 3; ATN and AIN both possible. -CLIP as AKI to [place] start 3/11 if needed. -  He is nonoliguric - BUN and crt rising more slowly in between tx.  Plan for next HD today and continue outpatient schedule ( TTS) but continue to watch for signs of recovery   # Hypertension with tendency towards orthostasis on low-dose carvedilol and tamsulosin. Ease off on UF as seems euvolemic today   # Nephrotic range proteinuria with negative serological evaluation but decision against biopsy at the current time per previous notes.  # Right lower extremity infection with involvement of orthopedic hardware status post removal and antibiotics discontinued 2/28 with concern for AIN.  #Chronic anemia, hemoglobin in the 7s.check iron.   #Mild hyponatremia, continue to limit free water intake.  Corrected with more UF with HD  # Increased anion gap metabolic acidosis, managed with dialysis.  Anemia-  check iron and added ESA   Dispo-  now looking into LTAC ar at least increasing stamina and strength prior to discharge   Subjective:   Seen on HD, no new issues -  plan was to be for discharge after HD but seems he is not able to manage this due to stamina issues-  looking into possible LTAC  Objective Vital signs in last 24 hours: Vitals:   05/03/23 0427 05/03/23 0851 05/03/23 0911 05/03/23 0930  BP:  (!) 140/68 (!) 144/61 (!) 164/64  Pulse:  (!) 57 (!) 57 (!) 57  Resp:  16 15 17   Temp:  97.8 F (36.6 C)    TempSrc:      SpO2:  99% 100% 100%  Weight: 126.6 kg 124.6 kg    Height:       Weight change: -2.2 kg  Intake/Output Summary (Last 24 hours) at 05/03/2023 1029 Last data filed at 05/03/2023 0647 Gross per 24 hour  Intake 120 ml  Output 925 ml  Net -805 ml       Labs: RENAL PANEL Recent Labs  Lab 04/27/23 2145 04/28/23 0530 04/29/23 1015 04/30/23 0205  05/01/23 0704 05/03/23 0900  NA 131* 134* 136 134* 135 135  K 3.9 3.8 3.8 3.2* 3.7 3.7  CL 96* 100 100 97* 99 96*  CO2 19* 21* 23 27 25 24   GLUCOSE 257* 177* 159* 167* 151* 109*  BUN 69* 70* 74* 35* 44* 40*  CREATININE 9.02* 9.06* 9.31* 5.36* 6.15* 5.35*  CALCIUM 8.8* 8.5* 8.9 8.9 8.5* 8.4*  MG 2.1  --   --   --   --   --   PHOS  --  5.6* 5.9* 3.0 4.5 4.5  ALBUMIN  --  2.4* 2.5* 2.7* 2.5* 2.7*    Liver Function Tests: Recent Labs  Lab 04/30/23 0205 05/01/23 0704 05/03/23 0900  ALBUMIN 2.7* 2.5* 2.7*   No results for input(s): "LIPASE", "AMYLASE" in the last 168 hours. No results for input(s): "AMMONIA" in the last 168 hours. CBC: Recent Labs    03/28/23 0810 03/29/23 0407 04/26/23 0705 04/27/23 0530 04/28/23 0530 05/01/23 0703 05/03/23 0900  HGB  --    < > 7.9* 7.7* 7.8* 7.9* 8.3*  MCV  --    < > 85.7 86.1 86.6 88.1 89.6  VITAMINB12 305  --   --   --   --   --   --    < > =  values in this interval not displayed.    Cardiac Enzymes: No results for input(s): "CKTOTAL", "CKMB", "CKMBINDEX", "TROPONINI" in the last 168 hours.  CBG: Recent Labs  Lab 05/02/23 0755 05/02/23 1218 05/02/23 1608 05/03/23 0007 05/03/23 0521  GLUCAP 92 77 224* 209* 133*    Iron Studies: No results for input(s): "IRON", "TIBC", "TRANSFERRIN", "FERRITIN" in the last 72 hours. Studies/Results: No results found.   Medications: Infusions:    Scheduled Medications:  allopurinol  50 mg Oral Daily   amiodarone  200 mg Oral BID   apixaban  5 mg Oral BID   atorvastatin  40 mg Oral Daily   Chlorhexidine Gluconate Cloth  6 each Topical Q0600   Chlorhexidine Gluconate Cloth  6 each Topical Q0600   clopidogrel  75 mg Oral Q breakfast   darbepoetin (ARANESP) injection - DIALYSIS  200 mcg Subcutaneous Q Fri-1800   docusate sodium  100 mg Oral BID   DULoxetine  60 mg Oral Daily   ezetimibe  10 mg Oral Daily   gabapentin  300 mg Oral QHS   Gerhardt's butt cream   Topical TID    guaiFENesin  600 mg Oral BID   insulin aspart  0-9 Units Subcutaneous Q6H   insulin glargine  45 Units Subcutaneous Daily   levothyroxine  88 mcg Oral QAC breakfast   polyethylene glycol  17 g Oral Daily   sodium chloride flush  3 mL Intravenous Q12H   sodium chloride flush  3-10 mL Intravenous Q12H   tamsulosin  0.4 mg Oral Daily    have reviewed scheduled and prn medications.  Physical Exam: General:NAD, comfortable Heart:RRR, s1s2 nl Lungs: Reduced breath sound bilateral basal Abdomen:soft, Non-tender, distended Extremities:no edema  Dialysis Access: TDC in place  Sayana Salley A Evalena Fujii 05/03/2023,10:29 AM  LOS: 37 days

## 2023-05-03 NOTE — Plan of Care (Signed)
   Problem: Education: Goal: Knowledge of General Education information will improve Description Including pain rating scale, medication(s)/side effects and non-pharmacologic comfort measures Outcome: Progressing   Problem: Health Behavior/Discharge Planning: Goal: Ability to manage health-related needs will improve Outcome: Progressing

## 2023-05-03 NOTE — Progress Notes (Signed)
 PROGRESS NOTE    Harold [name]  NWG:[id number] DOB: [birth year] DOA: 03/27/2023 PCP: [name], MD   Brief Narrative:  Patient is a 68 year old male with morbid obesity, OSA, DM2, HTN, HLD, CAD/stent, A-fib on Eliquis, carotid artery stenosis, CKD, chronic anemia, anxiety/depression, diabetic neuropathy, GERD, hypothyroidism, COPD, pulmonary hypertension.  Patient presented with fever, cough, myalgia and generalized weakness and was found to be positive for influenza A.  Chest x-ray was negative for infiltrates.  Workup done during the hospital stay revealed right lower extremity wound infection/osteomyelitis of right ankle status post removal of hardware.  Cultures grew Enterobacter cloacae and MRSE.  Right lower extremity wound is healing well.  Antibiotics were managed by ID.  Patient monitored off antibiotics for a week, ID eventually signed off on 04/29/2023.  Apparently, patient developed worsening renal function on antibiotics.  Patient is on intermittent dialysis for severe and dense ATN.  Nephrology input is appreciated.   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 -Chest x-ray did not reveal any infiltrates.   -Improved with symptomatic treatment.  Patient has been on room air for very long time.   Right leg wound infection/osteomyelitis  right ankle s/p hardware removal Enterobacter Cloacea, MRSE  -Prior right ankle surgery (12/2022) complicated by postsurgical wound infection. -S/p right ankle hardware removal, I&D and wound VAC placement on 2/10 by Dr. Jena Gauss -S/p repeat debridement of the right ankle by Dr. Lajoyce Corners 04/02/2023.   -ID was consulted, initially on cefepime, subsequently on daptomycin and ertapenem, eventually antibiotics held due to worsening renal function.  Patient doing well without further signs of infection so ID eventually signed off on 04/29/2023.  No plan of antibiotics.   AKI on CKD stage IIIb / Nephrotic range proteinuria, uremia, Anion gap metabolic acidosis: -Baseline creatinine appears to be 1.9-2.2.   -Renal function has continued to progressively worsen -Nephrology following, renal biopsy deferred due to high risk of bleeding.  Patient was also on Plavix for recent stent for PAD  -TDC placed on 3/4, underwent first HD session and now is receiving intermittent dialysis.  Appreciate nephrology help.  Plan for him to start outpatient dialysis at [facility] dialysis on Tuesday.   Right facial droop -On 3/1 patient noted to have right facial droop, evaluated by neurology, MRI of the brain negative for acute stroke. -Currently on Plavix.   PAD -Seen by vascular surgery underwent aortogram, right leg angiogram, successful stenting of right superficial femoral artery on 04/01/2023.   -Continue Plavix, Zetia   Acute metabolic encephalopathy -Likely multifactorial, uremia, infection, drug-induced from possibility of cefepime induced encephalopathy, chronic worsening memory issues -Started on hemodialysis Continue Cymbalta, decreased Neurontin 04/26/2023: Encephalopathy resolved.   Type 2 diabetes mellitus / Diabetic neuropathy, POA: A1c 7 on 01/02/2023. PTA meds-Tresiba 60 units daily, Mounjaro.  Patient currently on [facility]  45 units twice daily and SSI, blood sugar much better controlled now.     Diabetic neuropathy -Gabapentin decreased due to worsening renal function. (PTA reportedly on gabapentin 1600 mg 3 times daily).  Currently on Cymbalta.   Hypokalemia/hypomagnesemia: resolved.   Paroxysmal A-fib with slow ventricular response: -HR noted to be in low 40s, cardiology consulted, Coreg held, and they recommended continuing amiodarone.  Heart rate has improved but still in bradycardia range in 50s.   -CHADS-VASc of 4.     Essential hypertension Coreg discontinued by cardiology blood pressure started rising, started on amlodipine 5 mg but then patient had orthostatic hypotension so amlodipine was discontinued.  This has been an issue in the past as well.  We will allow slightly higher blood pressure.  Check orthostatics every shift.    Anemia of chronic disease: Baseline hemoglobin between 10.5 11.5.   -H&H stable, borderline   CAD/stent Carotid artery stenosis HLD Continue PTA meds -Plavix, Crestor and Eliquis.   Hypothyroidism Continue Synthroid   Obstructive sleep apnea CPAP at bedtime    Gout Continue allopurinol, colchicine   Orthostatic hypotension -Received brief IV fluids, continue abdominal binder -2D echo showed EF 55 to 60%, basal inferior hypokinesis, G2 DD   Generalized debility -PT had recommended SNF however patient will have to pay out-of-pocket which he is not able to afford.   Dizziness/vertigo/double vision: Intermittent complaints of dizziness.  MRI brain completed on 04/19/2023 was unremarkable.  Low blood pressure is considered to be the cause.  Bradycardia was also suspected as one of the cause, Coreg has been discontinued and his heart rate is improving.  Symptoms have now resolved.   Morbid obesity: Estimated body mass index is 40.14 kg/m as calculated from the following:   Height as of this encounter: 6\' 1"  (1.854 m).   Weight as of this encounter: 138 kg. Weight loss and diet modification counseled.  Disposition: Patient was  medically ready for discharge 04/30/2023 however patient's spouse [name], who is the only other member living in the home was unavailable.  Per TOC, [name] will return in town on Friday.  However PT said "We need to practice wheelchair transfers with spouse which will likely not happen until late Friday. To set him up for success, it would be better if he could stay for dialysis on Saturday and discharge home from dialysis on Saturday" and based on their recommendations, we  planned for him to be discharged on Saturday/05/03/2023 after dialysis. However when PT/OT worked with him again on 05/02/2023, they were trying to simulate a normal day, getting dressed, getting into the wheelchair and walking in the hallway. After walking 25 feet pt rested in wheelchair for 10 minutes after attempting to stand x2 BP dropped to 82/40, pt had tremors in bilateral UE and was slurring his speech. This is with abdominal binder in place and TED hose on L leg.  PT did not think that patient can safely be discharged home.  Discussed with TOC.  Who eventually discussed with patient about LTAC option and patient agreed.  Per TOC, clinicals will be submitted to select LTAC on Monday.  Discharge on hold until final decision by LTAC.  DVT prophylaxis: Place TED hose Start: 05/01/23 1621 Place and maintain sequential compression device Start: 04/15/23 1549 SCDs Start: 04/02/23 1617   Code Status: Full Code  Family Communication:  None present at bedside.  Plan of care discussed with patient in length and he/she verbalized understanding and agreed with it.  Status is:  Inpatient Remains inpatient appropriate because: Please see above under disposition   Estimated body mass index is 36.82 kg/m as calculated from the following:   Height as of this encounter: 6\' 1"  (1.854 m).   Weight as of this encounter: 126.6 kg.  Pressure Injury 04/17/23 Ankle Right;Anterior Deep Tissue Pressure Injury - Purple or maroon localized area of  discolored intact skin or blood-filled blister due to damage of underlying soft tissue from pressure and/or shear. (Active)  04/17/23 2040  Location: Ankle  Location Orientation: Right;Anterior  Staging: Deep Tissue Pressure Injury - Purple or maroon localized area of discolored intact skin or blood-filled blister due to damage of underlying soft tissue from pressure and/or shear.  Wound Description (Comments):   Present on Admission: Yes  Dressing Type Compression wrap 05/02/23 2000   Nutritional Assessment: Body mass index is 36.82 kg/m.Marland Kitchen Seen by dietician.  I agree with the assessment and plan as outlined below: Nutrition Status:        . Skin Assessment: I have examined the patient's skin and I agree with the wound assessment as performed by the wound care RN as outlined below: Pressure Injury 04/17/23 Ankle Right;Anterior Deep Tissue Pressure Injury - Purple or maroon localized area of discolored intact skin or blood-filled blister due to damage of underlying soft tissue from pressure and/or shear. (Active)  04/17/23 2040  Location: Ankle  Location Orientation: Right;Anterior  Staging: Deep Tissue Pressure Injury - Purple or maroon localized area of discolored intact skin or blood-filled blister due to damage of underlying soft tissue from pressure and/or shear.  Wound Description (Comments):   Present on Admission: Yes  Dressing Type Compression wrap 05/02/23 2000    Consultants:  Cardiology  Procedures:  As above  Antimicrobials:  Anti-infectives (From admission, onward)    Start     Dose/Rate Route Frequency Ordered Stop   04/22/23 1300  vancomycin (VANCOCIN) IVPB 1000 mg/200 mL premix        1,000 mg 200 mL/hr over 60 Minutes Intravenous To Radiology 04/22/23 1110 04/22/23 1849   04/19/23 1000  ertapenem (INVANZ) 500 mg in sodium chloride 0.9 % 50 mL IVPB  Status:  Discontinued        500 mg 100 mL/hr over 30 Minutes Intravenous Daily 04/18/23 0931 04/18/23 1106    04/17/23 1800  ertapenem (INVANZ) 1 g in sodium chloride 0.9 % 100 mL IVPB  Status:  Discontinued        1 g 200 mL/hr over 30 Minutes Intravenous Daily 04/17/23 1354 04/18/23 0931   04/08/23 0000  daptomycin (CUBICIN) IVPB  Status:  Discontinued        700 mg Intravenous Every 24 hours 04/08/23 1147 04/08/23    04/08/23 0000  ceFEPime (MAXIPIME) IVPB  Status:  Discontinued        2 g Intravenous Every 12 hours 04/08/23 1147 04/18/23    04/08/23 0000  daptomycin (CUBICIN) IVPB  Status:  Discontinued        700 mg Intravenous Every 24 hours 04/08/23 1150 04/18/23    04/04/23 1400  DAPTOmycin (CUBICIN) IVPB 700 mg/175mL premix  Status:  Discontinued        700 mg 200 mL/hr over 30 Minutes Intravenous Daily 04/03/23 1435 04/18/23 1106   04/02/23 1715  ceFAZolin (ANCEF) IVPB 2g/100 mL premix  Status:  Discontinued        2 g 200 mL/hr over 30 Minutes Intravenous Every 8 hours 04/02/23 1616 04/02/23 1623   04/02/23 0600  ceFAZolin (ANCEF)  IVPB 3g/150 mL premix  Status:  Discontinued        3 g 300 mL/hr over 30 Minutes Intravenous On call to O.R. 04/01/23 1700 04/02/23 1549   03/31/23 1015  ceFAZolin (ANCEF) IVPB 2g/100 mL premix        2 g 200 mL/hr over 30 Minutes Intravenous On call to O.R. 03/31/23 0921 03/31/23 1203   03/29/23 2200  vancomycin (VANCOREADY) IVPB 2000 mg/400 mL  Status:  Discontinued       Placed in "Followed by" Linked Group   2,000 mg 200 mL/hr over 120 Minutes Intravenous Every 48 hours 03/27/23 2257 04/03/23 1435   03/28/23 1045  metroNIDAZOLE (FLAGYL) tablet 500 mg  Status:  Discontinued        500 mg Oral Every 12 hours 03/28/23 0955 04/03/23 0945   03/27/23 2330  vancomycin (VANCOCIN) 2,500 mg in sodium chloride 0.9 % 500 mL IVPB       Placed in "Followed by" Linked Group   2,500 mg 262.5 mL/hr over 120 Minutes Intravenous  Once 03/27/23 2257 03/28/23 0241   03/27/23 2300  metroNIDAZOLE (FLAGYL) IVPB 500 mg  Status:  Discontinued        500 mg 100 mL/hr over  60 Minutes Intravenous Every 12 hours 03/27/23 2247 03/28/23 0955   03/27/23 2300  ceFEPIme (MAXIPIME) 2 g in sodium chloride 0.9 % 100 mL IVPB  Status:  Discontinued        2 g 200 mL/hr over 30 Minutes Intravenous Every 12 hours 03/27/23 2257 04/17/23 1354   03/27/23 2100  ceFAZolin (ANCEF) IVPB 2g/100 mL premix        2 g 200 mL/hr over 30 Minutes Intravenous  Once 03/27/23 2058 03/27/23 2219         Subjective: Patient seen and examined in dialysis.  He has no complaints.  He is fully aware of the plan.  Objective: Vitals:   05/02/23 1711 05/02/23 1948 05/03/23 0400 05/03/23 0427  BP: (!) 148/57 (!) 152/57 (!) 146/57   Pulse:  (!) 59 (!) 57   Resp:  20    Temp:  98 F (36.7 C) 98.2 F (36.8 C)   TempSrc:  Oral Oral   SpO2:  100%    Weight:    126.6 kg  Height:        Intake/Output Summary (Last 24 hours) at 05/03/2023 0754 Last data filed at 05/03/2023 0647 Gross per 24 hour  Intake 120 ml  Output 925 ml  Net -805 ml   Filed Weights   05/01/23 1139 05/02/23 0614 05/03/23 0427  Weight: 125.8 kg 126 kg 126.6 kg    Examination:  General exam: Appears calm and comfortable, obese Respiratory system: Clear to auscultation. Respiratory effort normal. Cardiovascular system: S1 & S2 heard, RRR. No JVD, murmurs, rubs, gallops or clicks. No pedal edema. Gastrointestinal system: Abdomen is nondistended, soft and nontender. No organomegaly or masses felt. Normal bowel sounds heard. Central nervous system: Alert and oriented. No focal neurological deficits. Extremities: Symmetric 5 x 5 power. Skin: No rashes, lesions or ulcers.  Psychiatry: Judgement and insight appear normal. Mood & affect appropriate.    Data Reviewed: I have personally reviewed following labs and imaging studies  CBC: Recent Labs  Lab 04/26/23 0705 04/27/23 0530 04/28/23 0530 05/01/23 0703  WBC 6.5 6.5 6.2 6.8  HGB 7.9* 7.7* 7.8* 7.9*  HCT 22.7* 22.3* 23.3* 23.7*  MCV 85.7 86.1 86.6 88.1  PLT  171 172 181 187   Basic  Metabolic Panel: Recent Labs  Lab 04/27/23 0530 04/27/23 2145 04/28/23 0530 04/29/23 1015 04/30/23 0205 05/01/23 0704  NA 132* 131* 134* 136 134* 135  K 3.6 3.9 3.8 3.8 3.2* 3.7  CL 100 96* 100 100 97* 99  CO2 21* 19* 21* 23 27 25   GLUCOSE 193* 257* 177* 159* 167* 151*  BUN 63* 69* 70* 74* 35* 44*  CREATININE 8.69* 9.02* 9.06* 9.31* 5.36* 6.15*  CALCIUM 8.4* 8.8* 8.5* 8.9 8.9 8.5*  MG  --  2.1  --   --   --   --   PHOS 5.3*  --  5.6* 5.9* 3.0 4.5   GFR: Estimated Creatinine Clearance: 15.8 mL/min (A) (by C-G formula based on SCr of 6.15 mg/dL (H)). Liver Function Tests: Recent Labs  Lab 04/27/23 0530 04/28/23 0530 04/29/23 1015 04/30/23 0205 05/01/23 0704  ALBUMIN 2.3* 2.4* 2.5* 2.7* 2.5*   No results for input(s): "LIPASE", "AMYLASE" in the last 168 hours. No results for input(s): "AMMONIA" in the last 168 hours. Coagulation Profile: No results for input(s): "INR", "PROTIME" in the last 168 hours. Cardiac Enzymes: No results for input(s): "CKTOTAL", "CKMB", "CKMBINDEX", "TROPONINI" in the last 168 hours.  BNP (last 3 results) No results for input(s): "PROBNP" in the last 8760 hours. HbA1C: No results for input(s): "HGBA1C" in the last 72 hours. CBG: Recent Labs  Lab 05/02/23 0755 05/02/23 1218 05/02/23 1608 05/03/23 0007 05/03/23 0521  GLUCAP 92 77 224* 209* 133*   Lipid Profile: No results for input(s): "CHOL", "HDL", "LDLCALC", "TRIG", "CHOLHDL", "LDLDIRECT" in the last 72 hours. Thyroid Function Tests: No results for input(s): "TSH", "T4TOTAL", "FREET4", "T3FREE", "THYROIDAB" in the last 72 hours. Anemia Panel: No results for input(s): "VITAMINB12", "FOLATE", "FERRITIN", "TIBC", "IRON", "RETICCTPCT" in the last 72 hours. Sepsis Labs: No results for input(s): "PROCALCITON", "LATICACIDVEN" in the last 168 hours.  No results found for this or any previous visit (from the past 240 hours).   Radiology Studies: No results  found.   Scheduled Meds:  allopurinol  50 mg Oral Daily   amiodarone  200 mg Oral BID   apixaban  5 mg Oral BID   atorvastatin  40 mg Oral Daily   Chlorhexidine Gluconate Cloth  6 each Topical Q0600   Chlorhexidine Gluconate Cloth  6 each Topical Q0600   clopidogrel  75 mg Oral Q breakfast   darbepoetin (ARANESP) injection - DIALYSIS  200 mcg Subcutaneous Q Fri-1800   docusate sodium  100 mg Oral BID   DULoxetine  60 mg Oral Daily   ezetimibe  10 mg Oral Daily   gabapentin  300 mg Oral QHS   Gerhardt's butt cream   Topical TID   guaiFENesin  600 mg Oral BID   insulin aspart  0-9 Units Subcutaneous Q6H   insulin glargine  45 Units Subcutaneous Daily   levothyroxine  88 mcg Oral QAC breakfast   polyethylene glycol  17 g Oral Daily   sodium chloride flush  3 mL Intravenous Q12H   sodium chloride flush  3-10 mL Intravenous Q12H   tamsulosin  0.4 mg Oral Daily   Continuous Infusions:   LOS: 37 days   Hughie Closs, MD [facility]  05/03/2023, 7:54 AM   *Please note that this is a verbal dictation therefore any spelling or grammatical errors are due to the "Dragon Medical One" system interpretation.  Please page via Amion and do not message via secure chat for urgent patient care matters. Secure chat can be used for  non urgent patient care matters.  How to contact the [facility] Attending or Consulting provider 7A - 7P or covering provider during after hours 7P -7A, for this patient?  Check the care team in [facility] and look for a) attending/consulting [facility] provider listed and b) the [facility] team listed. Page or secure chat 7A-7P. Log into [email or web address] and use [facility] universal password to access. If you do not have the password, please contact the hospital operator. Locate the [facility] provider you are looking for under [facility] and page to a number that you can be directly reached. If you still have difficulty reaching the provider, please page the [facility] (Director on Call) for  the Hospitalists listed on amion for assistance.

## 2023-05-03 NOTE — Plan of Care (Signed)
  Problem: Education: Goal: Knowledge of General Education information will improve Description: Including pain rating scale, medication(s)/side effects and non-pharmacologic comfort measures Outcome: Progressing   Problem: Health Behavior/Discharge Planning: Goal: Ability to manage health-related needs will improve Outcome: Progressing   Problem: Clinical Measurements: Goal: Ability to maintain clinical measurements within normal limits will improve Outcome: Progressing Goal: Will remain free from infection Outcome: Progressing Goal: Diagnostic test results will improve Outcome: Progressing Goal: Cardiovascular complication will be avoided Outcome: Progressing   Problem: Activity: Goal: Risk for activity intolerance will decrease Outcome: Progressing   Problem: Nutrition: Goal: Adequate nutrition will be maintained Outcome: Progressing   Problem: Elimination: Goal: Will not experience complications related to bowel motility Outcome: Progressing   Problem: Pain Managment: Goal: General experience of comfort will improve and/or be controlled Outcome: Progressing   Problem: Safety: Goal: Ability to remain free from injury will improve Outcome: Progressing   Problem: Skin Integrity: Goal: Risk for impaired skin integrity will decrease Outcome: Progressing   Problem: Education: Goal: Ability to describe self-care measures that may prevent or decrease complications (Diabetes Survival Skills Education) will improve Outcome: Progressing Goal: Individualized Educational Video(s) Outcome: Progressing   Problem: Coping: Goal: Ability to adjust to condition or change in health will improve Outcome: Progressing   Problem: Fluid Volume: Goal: Ability to maintain a balanced intake and output will improve Outcome: Progressing   Problem: Health Behavior/Discharge Planning: Goal: Ability to identify and utilize available resources and services will improve Outcome:  Progressing Goal: Ability to manage health-related needs will improve Outcome: Progressing   Problem: Metabolic: Goal: Ability to maintain appropriate glucose levels will improve Outcome: Progressing   Problem: Nutritional: Goal: Maintenance of adequate nutrition will improve Outcome: Progressing Goal: Progress toward achieving an optimal weight will improve Outcome: Progressing   Problem: Skin Integrity: Goal: Risk for impaired skin integrity will decrease Outcome: Progressing   Problem: Tissue Perfusion: Goal: Adequacy of tissue perfusion will improve Outcome: Progressing   Problem: Education: Goal: Understanding of CV disease, CV risk reduction, and recovery process will improve Outcome: Progressing Goal: Individualized Educational Video(s) Outcome: Progressing   Problem: Activity: Goal: Ability to return to baseline activity level will improve Outcome: Progressing   Problem: Cardiovascular: Goal: Ability to achieve and maintain adequate cardiovascular perfusion will improve Outcome: Progressing Goal: Vascular access site(s) Level 0-1 will be maintained Outcome: Progressing   Problem: Health Behavior/Discharge Planning: Goal: Ability to safely manage health-related needs after discharge will improve Outcome: Progressing

## 2023-05-03 NOTE — Progress Notes (Signed)
 Occupational Therapy Treatment Patient Details Name: Harold [name] MRN: [id number] DOB: 1955/04/30 Today's Date: 05/03/2023   History of present illness 68 y.o. male presents to [facility] 03/27/23 with productive cough and generalized weakness. Admitted with influenza A and cellulitis of R foot s/p hardware removal of the R ankle and application of wound vac on 2/10, wound vac removed 2/27. Left thigh pain, CT negative. Prior admit 11/24 for R ankle ORIF and in 12/24 for revision. S/p excisional debridement of right ankle on 2/12. Pt underwent central line placement on 04/07/2023 however he has been having issues with recurrent bleeding from the insertion site which has finally stopped. 04/17/23 pt with drop in sats requiring 2 L of O2 via [place], chest xray concerning for edema or possibly pneumonia.Significant PMH: HTN, HLD, CAD s/p PCI, COPD, DM2, CKD stage IIIb, PVD, gout, OSA, neuropathy, COPD, a-fib, CAD s/p percutaneous coronary angioplasty, angina, obesity   OT comments  STAR Patient OT session: Patient received in supine and agreeable to OT treatment but declined getting in recliner due to recently returned from HD and feeling weak. Patient able to get to EOB with CGA with BP 108/75 on EOB. Abdominal binder was donned and patient's BP was 123/55. Patient performed standing from EOB with min assist and BP while standing 90/49 with patient having complaints of dizziness. Patient BP checked again seated on EOB after rest with 105/69. Patient stood a second time and performed side steps towards HOB with BP 71/52. Patient able to return to supine with CGA and BP checked while supine with 120/55. Discharge recommendations changed to LTAC due to patient could benefit from further therapeutic intervention to increase safety with transfers and mobility for safe discharge home. Acute OT to continue to follow to address established goals to facilitate DC to next venue of care.       If plan is discharge home,  recommend the following:  A lot of help with bathing/dressing/bathroom;Assistance with cooking/housework;Help with stairs or ramp for entrance;Assist for transportation;Two people to help with walking and/or transfers   Equipment Recommendations  Other (comment) (bariatric BSC)    Recommendations for Other Services      Precautions / Restrictions Precautions Precautions: Fall Recall of Precautions/Restrictions: Impaired Precaution/Restrictions Comments: Watch BP Required Braces or Orthoses: Other Brace Other Brace: abdominal binder (Prn)/ TED Hose on Restrictions Weight Bearing Restrictions Per Provider Order: Yes RLE Weight Bearing Per Provider Order: Weight bearing as tolerated Other Position/Activity Restrictions: per chat with Dr Lajoyce Corners, 3/12 pt no longer needs CAM boot per Dr. Audrie Lia note 04/03/2023 and orders 04/03/2023 pt WBAT       Mobility Bed Mobility Overal bed mobility: Needs Assistance Bed Mobility: Supine to Sit, Sit to Supine     Supine to sit: Contact guard, HOB elevated, Used rails Sit to supine: Contact guard assist, Used rails   General bed mobility comments: CGA with trunk    Transfers Overall transfer level: Needs assistance Equipment used: Rolling walker (2 wheels) Transfers: Sit to/from Stand Sit to Stand: Min assist, From elevated surface           General transfer comment: performed 2 stands from EOB with min assistand performed side steps towards HOB on 2 stand     Balance Overall balance assessment: Needs assistance Sitting-balance support: No upper extremity supported, Feet supported Sitting balance-Leahy Scale: Fair Sitting balance - Comments: performed grooming seated on EOB Postural control: Posterior lean Standing balance support: Bilateral upper extremity supported, During functional activity, Reliant on assistive device  for balance Standing balance-Leahy Scale: Poor Standing balance comment: reliant on RW for balance, limited  standing tolerance due to orthostatic                           ADL either performed or assessed with clinical judgement   ADL Overall ADL's : Needs assistance/impaired     Grooming: Wash/dry hands;Wash/dry face;Oral care;Set up;Sitting Grooming Details (indicate cue type and reason): on EOB                                    Extremity/Trunk Assessment              Vision       Perception     Praxis     Communication Communication Communication: Impaired   Cognition Arousal: Alert Behavior During Therapy: WFL for tasks assessed/performed                                 Following commands: Impaired Following commands impaired: Follows one step commands inconsistently, Follows one step commands with increased time      Cueing   Cueing Techniques: Verbal cues, Tactile cues  Exercises      Shoulder Instructions       General Comments See OT note for BP    Pertinent Vitals/ Pain       Pain Assessment Pain Assessment: Faces Faces Pain Scale: Hurts a little bit Pain Location: generalized Pain Descriptors / Indicators: Discomfort, Grimacing Pain Intervention(s): Monitored during session, Repositioned  Home Living                                          Prior Functioning/Environment              Frequency  Min 2X/week        Progress Toward Goals  OT Goals(current goals can now be found in the care plan section)  Progress towards OT goals: Progressing toward goals  Acute Rehab OT Goals Patient Stated Goal: to go home OT Goal Formulation: With patient Time For Goal Achievement: 05/14/23 Potential to Achieve Goals: Good ADL Goals Pt Will Perform Grooming: with set-up;sitting Pt Will Perform Lower Body Dressing: with min assist;with adaptive equipment;with caregiver independent in assisting;sitting/lateral leans;sit to/from stand Pt Will Transfer to Toilet: stand pivot transfer;with  min assist;bedside commode Pt Will Perform Toileting - Clothing Manipulation and hygiene: with min assist;sitting/lateral leans;sit to/from stand Pt/caregiver will Perform Home Exercise Program: Increased strength;Both right and left upper extremity;With theraband;With written HEP provided Additional ADL Goal #1: Pt will be Mod I in and OOB for basic ADLs  Plan      Co-evaluation                 AM-PAC OT "6 Clicks" Daily Activity     Outcome Measure   Help from another person eating meals?: None Help from another person taking care of personal grooming?: A Little Help from another person toileting, which includes using toliet, bedpan, or urinal?: A Lot Help from another person bathing (including washing, rinsing, drying)?: A Lot Help from another person to put on and taking off regular upper body clothing?: A Little Help from another person to put on and  taking off regular lower body clothing?: A Lot 6 Click Score: 16    End of Session Equipment Utilized During Treatment: Gait belt;Rolling walker (2 wheels)  OT Visit Diagnosis: Unsteadiness on feet (R26.81);Other abnormalities of gait and mobility (R26.89);Muscle weakness (generalized) (M62.81)   Activity Tolerance Patient tolerated treatment well   Patient Left in bed;with call bell/phone within reach;with bed alarm set   Nurse Communication Mobility status        Time: 1610-9604 OT Time Calculation (min): 28 min  Charges: OT General Charges $OT Visit: 1 Visit OT Treatments $Self Care/Home Management : 8-22 mins $Therapeutic Activity: 8-22 mins  Alfonse Flavors, OTA Acute Rehabilitation Services  Office [phone]   Dewain Penning 05/03/2023, 2:31 PM

## 2023-05-03 NOTE — Procedures (Signed)
 Patient was seen on dialysis and the procedure was supervised.  BFR 300  Via TDC BP is  154/68.   Patient appears to be tolerating treatment well  Harold [name] 05/03/2023

## 2023-05-04 DIAGNOSIS — L02415 Cutaneous abscess of right lower limb: Secondary | ICD-10-CM | POA: Diagnosis not present

## 2023-05-04 LAB — GLUCOSE, CAPILLARY
Glucose-Capillary: 129 mg/dL — ABNORMAL HIGH (ref 70–99)
Glucose-Capillary: 174 mg/dL — ABNORMAL HIGH (ref 70–99)
Glucose-Capillary: 234 mg/dL — ABNORMAL HIGH (ref 70–99)
Glucose-Capillary: 244 mg/dL — ABNORMAL HIGH (ref 70–99)

## 2023-05-04 NOTE — Plan of Care (Signed)
  Problem: Education: Goal: Knowledge of General Education information will improve Description: Including pain rating scale, medication(s)/side effects and non-pharmacologic comfort measures Outcome: Progressing   Problem: Health Behavior/Discharge Planning: Goal: Ability to manage health-related needs will improve Outcome: Progressing   Problem: Clinical Measurements: Goal: Ability to maintain clinical measurements within normal limits will improve Outcome: Progressing Goal: Will remain free from infection Outcome: Progressing Goal: Diagnostic test results will improve Outcome: Progressing Goal: Cardiovascular complication will be avoided Outcome: Progressing   Problem: Activity: Goal: Risk for activity intolerance will decrease Outcome: Progressing   Problem: Nutrition: Goal: Adequate nutrition will be maintained Outcome: Progressing   Problem: Elimination: Goal: Will not experience complications related to bowel motility Outcome: Progressing   Problem: Pain Managment: Goal: General experience of comfort will improve and/or be controlled Outcome: Progressing   Problem: Safety: Goal: Ability to remain free from injury will improve Outcome: Progressing   Problem: Skin Integrity: Goal: Risk for impaired skin integrity will decrease Outcome: Progressing   Problem: Education: Goal: Ability to describe self-care measures that may prevent or decrease complications (Diabetes Survival Skills Education) will improve Outcome: Progressing Goal: Individualized Educational Video(s) Outcome: Progressing   Problem: Coping: Goal: Ability to adjust to condition or change in health will improve Outcome: Progressing   Problem: Fluid Volume: Goal: Ability to maintain a balanced intake and output will improve Outcome: Progressing   Problem: Health Behavior/Discharge Planning: Goal: Ability to identify and utilize available resources and services will improve Outcome:  Progressing Goal: Ability to manage health-related needs will improve Outcome: Progressing   Problem: Metabolic: Goal: Ability to maintain appropriate glucose levels will improve Outcome: Progressing   Problem: Nutritional: Goal: Maintenance of adequate nutrition will improve Outcome: Progressing Goal: Progress toward achieving an optimal weight will improve Outcome: Progressing   Problem: Skin Integrity: Goal: Risk for impaired skin integrity will decrease Outcome: Progressing   Problem: Tissue Perfusion: Goal: Adequacy of tissue perfusion will improve Outcome: Progressing   Problem: Education: Goal: Understanding of CV disease, CV risk reduction, and recovery process will improve Outcome: Progressing Goal: Individualized Educational Video(s) Outcome: Progressing   Problem: Activity: Goal: Ability to return to baseline activity level will improve Outcome: Progressing   Problem: Cardiovascular: Goal: Ability to achieve and maintain adequate cardiovascular perfusion will improve Outcome: Progressing Goal: Vascular access site(s) Level 0-1 will be maintained Outcome: Progressing   Problem: Health Behavior/Discharge Planning: Goal: Ability to safely manage health-related needs after discharge will improve Outcome: Progressing

## 2023-05-04 NOTE — Progress Notes (Signed)
 [facility] NEPHROLOGY PROGRESS NOTE  Assessment/ Plan: Pt is a 68 y.o. yo male    # Dialysis dependent AKI on CKD 3; ATN and AIN both possible. -CLIP as AKI to [place] start 3/11 if needed. -  He is nonoliguric - BUN and crt rising more slowly in between tx.  Plan for next HD Tuesday and continue outpatient schedule ( TTS) but continue to watch for signs of recovery   # Hypertension with tendency towards orthostasis on low-dose carvedilol and tamsulosin. Ease off on UF as seems euvolemic and is nonoliguric  # Nephrotic range proteinuria with negative serological evaluation but decision against biopsy at the current time per previous notes.  # Right lower extremity infection with involvement of orthopedic hardware status post removal and antibiotics discontinued 2/28 with concern for AIN.  #Chronic anemia, hemoglobin in the 7s.check iron.   #Mild hyponatremia, continue to limit free water intake.  Corrected with more UF with HD  # Increased anion gap metabolic acidosis, managed with dialysis.  Anemia-   iron ok  and added ESA   Dispo-  now looking into LTAC ar at least increasing stamina and strength prior to discharge   Subjective:   no new issues -  s/p HD yest.   plan was to be for discharge after HD yest but seems he is not able to manage this due to stamina/strength issues-  looking into possible LTAC  -  650 of UOP  Objective Vital signs in last 24 hours: Vitals:   05/03/23 2200 05/04/23 0000 05/04/23 0500 05/04/23 0815  BP: (!) 146/60 (!) 131/51 (!) 148/57 (!) 145/75  Pulse: 60 (!) 59 (!) 58   Resp: 18     Temp:  97.9 F (36.6 C) 98.1 F (36.7 C) (!) 97.5 F (36.4 C)  TempSrc:  Oral Oral Oral  SpO2:    100%  Weight:   120.1 kg   Height:       Weight change: -2 kg  Intake/Output Summary (Last 24 hours) at 05/04/2023 0940 Last data filed at 05/03/2023 2000 Gross per 24 hour  Intake 480 ml  Output 651 ml  Net -171 ml       Labs: RENAL  PANEL Recent Labs  Lab 04/27/23 2145 04/28/23 0530 04/29/23 1015 04/30/23 0205 05/01/23 0704 05/03/23 0900  NA 131* 134* 136 134* 135 135  K 3.9 3.8 3.8 3.2* 3.7 3.7  CL 96* 100 100 97* 99 96*  CO2 19* 21* 23 27 25 24   GLUCOSE 257* 177* 159* 167* 151* 109*  BUN 69* 70* 74* 35* 44* 40*  CREATININE 9.02* 9.06* 9.31* 5.36* 6.15* 5.35*  CALCIUM 8.8* 8.5* 8.9 8.9 8.5* 8.4*  MG 2.1  --   --   --   --   --   PHOS  --  5.6* 5.9* 3.0 4.5 4.5  ALBUMIN  --  2.4* 2.5* 2.7* 2.5* 2.7*    Liver Function Tests: Recent Labs  Lab 04/30/23 0205 05/01/23 0704 05/03/23 0900  ALBUMIN 2.7* 2.5* 2.7*   No results for input(s): "LIPASE", "AMYLASE" in the last 168 hours. No results for input(s): "AMMONIA" in the last 168 hours. CBC: Recent Labs    03/28/23 0810 03/29/23 0407 04/26/23 0705 04/27/23 0530 04/28/23 0530 05/01/23 0703 05/03/23 0900  HGB  --    < > 7.9* 7.7* 7.8* 7.9* 8.3*  MCV  --    < > 85.7 86.1 86.6 88.1 89.6  VITAMINB12 305  --   --   --   --   --   --  FERRITIN  --   --   --   --   --   --  179  TIBC  --   --   --   --   --   --  190*  IRON  --   --   --   --   --   --  43*   < > = values in this interval not displayed.    Cardiac Enzymes: No results for input(s): "CKTOTAL", "CKMB", "CKMBINDEX", "TROPONINI" in the last 168 hours.  CBG: Recent Labs  Lab 05/03/23 0007 05/03/23 0521 05/03/23 1618 05/03/23 2354 05/04/23 0529  GLUCAP 209* 133* 117* 219* 174*    Iron Studies:  Recent Labs    05/03/23 0900  IRON 43*  TIBC 190*  FERRITIN 179   Studies/Results: No results found.   Medications: Infusions:    Scheduled Medications:  allopurinol  50 mg Oral Daily   amiodarone  200 mg Oral BID   apixaban  5 mg Oral BID   atorvastatin  40 mg Oral Daily   Chlorhexidine Gluconate Cloth  6 each Topical Q0600   Chlorhexidine Gluconate Cloth  6 each Topical Q0600   clopidogrel  75 mg Oral Q breakfast   darbepoetin (ARANESP) injection - DIALYSIS  200 mcg  Subcutaneous Q Fri-1800   docusate sodium  100 mg Oral BID   DULoxetine  60 mg Oral Daily   ezetimibe  10 mg Oral Daily   gabapentin  300 mg Oral QHS   Gerhardt's butt cream   Topical TID   guaiFENesin  600 mg Oral BID   insulin aspart  0-9 Units Subcutaneous Q6H   insulin glargine  45 Units Subcutaneous Daily   levothyroxine  88 mcg Oral QAC breakfast   polyethylene glycol  17 g Oral Daily   sodium chloride flush  3 mL Intravenous Q12H   sodium chloride flush  3-10 mL Intravenous Q12H   tamsulosin  0.4 mg Oral Daily    have reviewed scheduled and prn medications.  Physical Exam: General:NAD, comfortable Heart:RRR, s1s2 nl Lungs: Reduced breath sound bilateral basal Abdomen:soft, Non-tender, distended Extremities:no edema  Dialysis Access: TDC in place  Alexcis Bicking A Drelyn Pistilli 05/04/2023,9:40 AM  LOS: 38 days

## 2023-05-04 NOTE — Progress Notes (Signed)
 PROGRESS NOTE    Harold [name]  QMV:[id number] DOB: 04-11-1955 DOA: 03/27/2023 PCP: [name], MD   Brief Narrative:  Patient is a 68 year old male with morbid obesity, OSA, DM2, HTN, HLD, CAD/stent, A-fib on Eliquis, carotid artery stenosis, CKD, chronic anemia, anxiety/depression, diabetic neuropathy, GERD, hypothyroidism, COPD, pulmonary hypertension.  Patient presented with fever, cough, myalgia and generalized weakness and was found to be positive for influenza A.  Chest x-ray was negative for infiltrates.  Workup done during the hospital stay revealed right lower extremity wound infection/osteomyelitis of right ankle status post removal of hardware.  Cultures grew Enterobacter cloacae and MRSE.  Right lower extremity wound is healing well.  Antibiotics were managed by ID.  Patient monitored off antibiotics for a week, ID eventually signed off on 04/29/2023.  Apparently, patient developed worsening renal function on antibiotics.  Patient is on intermittent dialysis for severe and dense ATN.  Nephrology input is appreciated.   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 -Chest x-ray did not reveal any infiltrates.   -Improved with symptomatic treatment.  Patient has been on room air for very long time.   Right leg wound infection/osteomyelitis  right ankle s/p hardware removal Enterobacter Cloacea, MRSE  -Prior right ankle surgery (12/2022) complicated by postsurgical wound infection. -S/p right ankle hardware removal, I&D and wound VAC placement on 2/10 by Dr. Jena Gauss -S/p repeat debridement of the right ankle by Dr. Lajoyce Corners 04/02/2023.   -ID was consulted, initially on cefepime, subsequently on daptomycin and ertapenem, eventually antibiotics held due to worsening renal function.  Patient doing well without further signs of infection so ID eventually signed off on 04/29/2023.  No plan of antibiotics.   AKI on CKD stage IIIb / Nephrotic range proteinuria, uremia, Anion gap metabolic acidosis: -Baseline creatinine appears to be 1.9-2.2.   -Renal function has continued to progressively worsen -Nephrology following, renal biopsy deferred due to high risk of bleeding.  Patient was also on Plavix for recent stent for PAD  -TDC placed on 3/4, underwent first HD session and now is receiving intermittent dialysis.  Appreciate nephrology help.  Plan for him to start outpatient dialysis at [facility] dialysis on Tuesday.   Right facial droop -On 3/1 patient noted to have right facial droop, evaluated by neurology, MRI of the brain negative for acute stroke. -Currently on Plavix.   PAD -Seen by vascular surgery underwent aortogram, right leg angiogram, successful stenting of right superficial femoral artery on 04/01/2023.   -Continue Plavix, Zetia   Acute metabolic encephalopathy -Likely multifactorial, uremia, infection, drug-induced from possibility of cefepime induced encephalopathy, chronic worsening memory issues -Started on hemodialysis Continue Cymbalta, decreased Neurontin 04/26/2023: Encephalopathy resolved.   Type 2 diabetes mellitus / Diabetic neuropathy, POA: A1c 7 on 01/02/2023. PTA meds-Tresiba 60 units daily, Mounjaro.  Patient currently on [facility]  45 units twice daily and SSI, blood sugar much better controlled now.     Diabetic neuropathy -Gabapentin decreased due to worsening renal function. (PTA reportedly on gabapentin 1600 mg 3 times daily).  Currently on Cymbalta.   Hypokalemia/hypomagnesemia: resolved.   Paroxysmal A-fib with slow ventricular response: -HR noted to be in low 40s, cardiology consulted, Coreg held, and they recommended continuing amiodarone.  Heart rate has improved but still in bradycardia range in 50s.   -CHADS-VASc of 4.     Essential hypertension Coreg discontinued by cardiology blood pressure started rising, started on amlodipine 5 mg but then patient had orthostatic hypotension so amlodipine was discontinued.  This has been an issue in the past as well.  We will allow slightly higher blood pressure.  Check orthostatics every shift.    Anemia of chronic disease: Baseline hemoglobin between 10.5 11.5.   -H&H stable, borderline   CAD/stent Carotid artery stenosis HLD Continue PTA meds -Plavix, Crestor and Eliquis.   Hypothyroidism Continue Synthroid   Obstructive sleep apnea CPAP at bedtime    Gout Continue allopurinol, colchicine   Orthostatic hypotension -Received brief IV fluids, continue abdominal binder -2D echo showed EF 55 to 60%, basal inferior hypokinesis, G2 DD   Generalized debility -PT had recommended SNF however patient will have to pay out-of-pocket which he is not able to afford.   Dizziness/vertigo/double vision: Intermittent complaints of dizziness.  MRI brain completed on 04/19/2023 was unremarkable.  Low blood pressure is considered to be the cause.  Bradycardia was also suspected as one of the cause, Coreg has been discontinued and his heart rate is improving.  Symptoms have now resolved.   Morbid obesity: Estimated body mass index is 40.14 kg/m as calculated from the following:   Height as of this encounter: 6\' 1"  (1.854 m).   Weight as of this encounter: 138 kg. Weight loss and diet modification counseled.  Disposition: Patient was  medically ready for discharge 04/30/2023 however patient's spouse Harold [name], who is the only other member living in the home was unavailable.  Per TOC, Harold [name] will return in town on Friday.  However PT said "We need to practice wheelchair transfers with spouse which will likely not happen until late Friday. To set him up for success, it would be better if he could stay for dialysis on Saturday and discharge home from dialysis on Saturday" and based on their recommendations, we  planned for him to be discharged on Saturday/05/03/2023 after dialysis. However when PT/OT worked with him again on 05/02/2023, they were trying to simulate a normal day, getting dressed, getting into the wheelchair and walking in the hallway. After walking 25 feet pt rested in wheelchair for 10 minutes after attempting to stand x2 BP dropped to 82/40, pt had tremors in bilateral UE and was slurring his speech. This is with abdominal binder in place and TED hose on L leg.  PT did not think that patient can safely be discharged home.  Discussed with TOC.  Who eventually discussed with patient about LTAC option and patient agreed.  Per TOC, clinicals will be submitted to select LTAC on Monday.  Discharge on hold until final decision by LTAC.  DVT prophylaxis: Place TED hose Start: 05/01/23 1621 Place and maintain sequential compression device Start: 04/15/23 1549 SCDs Start: 04/02/23 1617   Code Status: Full Code  Family Communication:  None present at bedside.  Plan of care discussed with patient in length and he/she verbalized understanding and agreed with it.  Status is:  Inpatient Remains inpatient appropriate because: Please see above under disposition   Estimated body mass index is 34.93 kg/m as calculated from the following:   Height as of this encounter: 6\' 1"  (1.854 m).   Weight as of this encounter: 120.1 kg.  Pressure Injury 04/17/23 Ankle Right;Anterior Deep Tissue Pressure Injury - Purple or maroon localized area of  discolored intact skin or blood-filled blister due to damage of underlying soft tissue from pressure and/or shear. (Active)  04/17/23 2040  Location: Ankle  Location Orientation: Right;Anterior  Staging: Deep Tissue Pressure Injury - Purple or maroon localized area of discolored intact skin or blood-filled blister due to damage of underlying soft tissue from pressure and/or shear.  Wound Description (Comments):   Present on Admission: Yes  Dressing Type Compression wrap 05/03/23 2000   Nutritional Assessment: Body mass index is 34.93 kg/m.Marland Kitchen Seen by dietician.  I agree with the assessment and plan as outlined below: Nutrition Status:        . Skin Assessment: I have examined the patient's skin and I agree with the wound assessment as performed by the wound care RN as outlined below: Pressure Injury 04/17/23 Ankle Right;Anterior Deep Tissue Pressure Injury - Purple or maroon localized area of discolored intact skin or blood-filled blister due to damage of underlying soft tissue from pressure and/or shear. (Active)  04/17/23 2040  Location: Ankle  Location Orientation: Right;Anterior  Staging: Deep Tissue Pressure Injury - Purple or maroon localized area of discolored intact skin or blood-filled blister due to damage of underlying soft tissue from pressure and/or shear.  Wound Description (Comments):   Present on Admission: Yes  Dressing Type Compression wrap 05/03/23 2000    Consultants:  Cardiology  Procedures:  As above  Antimicrobials:  Anti-infectives (From admission, onward)    Start     Dose/Rate Route Frequency Ordered Stop   04/22/23 1300  vancomycin (VANCOCIN) IVPB 1000 mg/200 mL premix        1,000 mg 200 mL/hr over 60 Minutes Intravenous To Radiology 04/22/23 1110 04/22/23 1849   04/19/23 1000  ertapenem (INVANZ) 500 mg in sodium chloride 0.9 % 50 mL IVPB  Status:  Discontinued        500 mg 100 mL/hr over 30 Minutes Intravenous Daily 04/18/23 0931 04/18/23 1106    04/17/23 1800  ertapenem (INVANZ) 1 g in sodium chloride 0.9 % 100 mL IVPB  Status:  Discontinued        1 g 200 mL/hr over 30 Minutes Intravenous Daily 04/17/23 1354 04/18/23 0931   04/08/23 0000  daptomycin (CUBICIN) IVPB  Status:  Discontinued        700 mg Intravenous Every 24 hours 04/08/23 1147 04/08/23    04/08/23 0000  ceFEPime (MAXIPIME) IVPB  Status:  Discontinued        2 g Intravenous Every 12 hours 04/08/23 1147 04/18/23    04/08/23 0000  daptomycin (CUBICIN) IVPB  Status:  Discontinued        700 mg Intravenous Every 24 hours 04/08/23 1150 04/18/23    04/04/23 1400  DAPTOmycin (CUBICIN) IVPB 700 mg/149mL premix  Status:  Discontinued        700 mg 200 mL/hr over 30 Minutes Intravenous Daily 04/03/23 1435 04/18/23 1106   04/02/23 1715  ceFAZolin (ANCEF) IVPB 2g/100 mL premix  Status:  Discontinued        2 g 200 mL/hr over 30 Minutes Intravenous Every 8 hours 04/02/23 1616 04/02/23 1623   04/02/23 0600  ceFAZolin (ANCEF)  IVPB 3g/150 mL premix  Status:  Discontinued        3 g 300 mL/hr over 30 Minutes Intravenous On call to O.R. 04/01/23 1700 04/02/23 1549   03/31/23 1015  ceFAZolin (ANCEF) IVPB 2g/100 mL premix        2 g 200 mL/hr over 30 Minutes Intravenous On call to O.R. 03/31/23 0921 03/31/23 1203   03/29/23 2200  vancomycin (VANCOREADY) IVPB 2000 mg/400 mL  Status:  Discontinued       Placed in "Followed by" Linked Group   2,000 mg 200 mL/hr over 120 Minutes Intravenous Every 48 hours 03/27/23 2257 04/03/23 1435   03/28/23 1045  metroNIDAZOLE (FLAGYL) tablet 500 mg  Status:  Discontinued        500 mg Oral Every 12 hours 03/28/23 0955 04/03/23 0945   03/27/23 2330  vancomycin (VANCOCIN) 2,500 mg in sodium chloride 0.9 % 500 mL IVPB       Placed in "Followed by" Linked Group   2,500 mg 262.5 mL/hr over 120 Minutes Intravenous  Once 03/27/23 2257 03/28/23 0241   03/27/23 2300  metroNIDAZOLE (FLAGYL) IVPB 500 mg  Status:  Discontinued        500 mg 100 mL/hr over  60 Minutes Intravenous Every 12 hours 03/27/23 2247 03/28/23 0955   03/27/23 2300  ceFEPIme (MAXIPIME) 2 g in sodium chloride 0.9 % 100 mL IVPB  Status:  Discontinued        2 g 200 mL/hr over 30 Minutes Intravenous Every 12 hours 03/27/23 2257 04/17/23 1354   03/27/23 2100  ceFAZolin (ANCEF) IVPB 2g/100 mL premix        2 g 200 mL/hr over 30 Minutes Intravenous  Once 03/27/23 2058 03/27/23 2219         Subjective: Patient seen and examined.  No complaints.  Objective: Vitals:   05/03/23 2100 05/03/23 2200 05/04/23 0000 05/04/23 0500  BP: (!) 146/60 (!) 146/60 (!) 131/51 (!) 148/57  Pulse: 61 60 (!) 59 (!) 58  Resp: 18 18    Temp: 97.9 F (36.6 C)  97.9 F (36.6 C) 98.1 F (36.7 C)  TempSrc: Oral  Oral Oral  SpO2:      Weight:    120.1 kg  Height:        Intake/Output Summary (Last 24 hours) at 05/04/2023 0818 Last data filed at 05/03/2023 2000 Gross per 24 hour  Intake 480 ml  Output 651 ml  Net -171 ml   Filed Weights   05/03/23 0427 05/03/23 0851 05/04/23 0500  Weight: 126.6 kg 124.6 kg 120.1 kg    Examination:  General exam: Appears calm and comfortable, obese Respiratory system: Clear to auscultation. Respiratory effort normal. Cardiovascular system: S1 & S2 heard, RRR. No JVD, murmurs, rubs, gallops or clicks. No pedal edema. Gastrointestinal system: Abdomen is nondistended, soft and nontender. No organomegaly or masses felt. Normal bowel sounds heard. Central nervous system: Alert and oriented. No focal neurological deficits. Extremities: Symmetric 5 x 5 power. Skin: No rashes, lesions or ulcers.  Psychiatry: Judgement and insight appear normal. Mood & affect appropriate.    Data Reviewed: I have personally reviewed following labs and imaging studies  CBC: Recent Labs  Lab 04/28/23 0530 05/01/23 0703 05/03/23 0900  WBC 6.2 6.8 8.5  HGB 7.8* 7.9* 8.3*  HCT 23.3* 23.7* 25.1*  MCV 86.6 88.1 89.6  PLT 181 187 195   Basic Metabolic Panel: Recent  Labs  Lab 04/27/23 2145 04/28/23 0530 04/29/23 1015 04/30/23 0205 05/01/23  0704 05/03/23 0900  NA 131* 134* 136 134* 135 135  K 3.9 3.8 3.8 3.2* 3.7 3.7  CL 96* 100 100 97* 99 96*  CO2 19* 21* 23 27 25 24   GLUCOSE 257* 177* 159* 167* 151* 109*  BUN 69* 70* 74* 35* 44* 40*  CREATININE 9.02* 9.06* 9.31* 5.36* 6.15* 5.35*  CALCIUM 8.8* 8.5* 8.9 8.9 8.5* 8.4*  MG 2.1  --   --   --   --   --   PHOS  --  5.6* 5.9* 3.0 4.5 4.5   GFR: Estimated Creatinine Clearance: 17.7 mL/min (A) (by C-G formula based on SCr of 5.35 mg/dL (H)). Liver Function Tests: Recent Labs  Lab 04/28/23 0530 04/29/23 1015 04/30/23 0205 05/01/23 0704 05/03/23 0900  ALBUMIN 2.4* 2.5* 2.7* 2.5* 2.7*   No results for input(s): "LIPASE", "AMYLASE" in the last 168 hours. No results for input(s): "AMMONIA" in the last 168 hours. Coagulation Profile: No results for input(s): "INR", "PROTIME" in the last 168 hours. Cardiac Enzymes: No results for input(s): "CKTOTAL", "CKMB", "CKMBINDEX", "TROPONINI" in the last 168 hours.  BNP (last 3 results) No results for input(s): "PROBNP" in the last 8760 hours. HbA1C: No results for input(s): "HGBA1C" in the last 72 hours. CBG: Recent Labs  Lab 05/03/23 0007 05/03/23 0521 05/03/23 1618 05/03/23 2354 05/04/23 0529  GLUCAP 209* 133* 117* 219* 174*   Lipid Profile: No results for input(s): "CHOL", "HDL", "LDLCALC", "TRIG", "CHOLHDL", "LDLDIRECT" in the last 72 hours. Thyroid Function Tests: No results for input(s): "TSH", "T4TOTAL", "FREET4", "T3FREE", "THYROIDAB" in the last 72 hours. Anemia Panel: Recent Labs    05/03/23 0900  FERRITIN 179  TIBC 190*  IRON 43*   Sepsis Labs: No results for input(s): "PROCALCITON", "LATICACIDVEN" in the last 168 hours.  No results found for this or any previous visit (from the past 240 hours).   Radiology Studies: No results found.   Scheduled Meds:  allopurinol  50 mg Oral Daily   amiodarone  200 mg Oral BID    apixaban  5 mg Oral BID   atorvastatin  40 mg Oral Daily   Chlorhexidine Gluconate Cloth  6 each Topical Q0600   Chlorhexidine Gluconate Cloth  6 each Topical Q0600   clopidogrel  75 mg Oral Q breakfast   darbepoetin (ARANESP) injection - DIALYSIS  200 mcg Subcutaneous Q Fri-1800   docusate sodium  100 mg Oral BID   DULoxetine  60 mg Oral Daily   ezetimibe  10 mg Oral Daily   gabapentin  300 mg Oral QHS   Gerhardt's butt cream   Topical TID   guaiFENesin  600 mg Oral BID   insulin aspart  0-9 Units Subcutaneous Q6H   insulin glargine  45 Units Subcutaneous Daily   levothyroxine  88 mcg Oral QAC breakfast   polyethylene glycol  17 g Oral Daily   sodium chloride flush  3 mL Intravenous Q12H   sodium chloride flush  3-10 mL Intravenous Q12H   tamsulosin  0.4 mg Oral Daily   Continuous Infusions:   LOS: 38 days   Hughie Closs, MD [facility]  05/04/2023, 8:18 AM   *Please note that this is a verbal dictation therefore any spelling or grammatical errors are due to the "[employer]agon [employer] One" system interpretation.  Please page via Amion and do not message via secure chat for urgent patient care matters. Secure chat can be used for non urgent patient care matters.  How to contact the [facility] Attending  or Consulting provider 7A - 7P or covering provider during after hours 7P -7A, for this patient?  Check the care team in [facility] and look for a) attending/consulting [facility] provider listed and b) the [facility] team listed. Page or secure chat 7A-7P. Log into [email or web address] and use [place]'s universal password to access. If you do not have the password, please contact the hospital operator. Locate the [facility] provider you are looking for under [facility] and page to a number that you can be directly reached. If you still have difficulty reaching the provider, please page the [facility] (Director on Call) for the Hospitalists listed on amion for assistance.

## 2023-05-05 ENCOUNTER — Inpatient Hospital Stay (HOSPITAL_COMMUNITY): Payer: Medicare Other

## 2023-05-05 ENCOUNTER — Other Ambulatory Visit (HOSPITAL_COMMUNITY): Payer: Self-pay

## 2023-05-05 ENCOUNTER — Inpatient Hospital Stay (HOSPITAL_COMMUNITY)

## 2023-05-05 DIAGNOSIS — L02415 Cutaneous abscess of right lower limb: Secondary | ICD-10-CM | POA: Diagnosis not present

## 2023-05-05 HISTORY — PX: IR REMOVAL TUN CV CATH W/O FL: IMG2289

## 2023-05-05 LAB — RENAL FUNCTION PANEL
Albumin: 2.6 g/dL — ABNORMAL LOW (ref 3.5–5.0)
Anion gap: 9 (ref 5–15)
BUN: 34 mg/dL — ABNORMAL HIGH (ref 8–23)
CO2: 27 mmol/L (ref 22–32)
Calcium: 8.5 mg/dL — ABNORMAL LOW (ref 8.9–10.3)
Chloride: 100 mmol/L (ref 98–111)
Creatinine, Ser: 4.68 mg/dL — ABNORMAL HIGH (ref 0.61–1.24)
GFR, Estimated: 13 mL/min — ABNORMAL LOW (ref >60)
Glucose, Bld: 267 mg/dL — ABNORMAL HIGH (ref 70–99)
Phosphorus: 4.3 mg/dL (ref 2.5–4.6)
Potassium: 4.3 mmol/L (ref 3.5–5.1)
Sodium: 136 mmol/L (ref 135–145)

## 2023-05-05 LAB — GLUCOSE, CAPILLARY
Glucose-Capillary: 260 mg/dL — ABNORMAL HIGH (ref 70–99)
Glucose-Capillary: 220 mg/dL — ABNORMAL HIGH (ref 70–99)
Glucose-Capillary: 157 mg/dL — ABNORMAL HIGH (ref 70–99)
Glucose-Capillary: 227 mg/dL — ABNORMAL HIGH (ref 70–99)

## 2023-05-05 MED ORDER — INSULIN GLARGINE 100 UNIT/ML ~~LOC~~ SOLN
50.0000 [IU] | Freq: Every day | SUBCUTANEOUS | Status: DC
  Administered 2023-05-05: 50 [IU] via SUBCUTANEOUS
  Filled 2023-05-05 (×2): qty 0.5

## 2023-05-05 NOTE — Progress Notes (Signed)
 Occupational Therapy Treatment Patient Details Name: Harold [name] MRN: [id number] DOB: Nov 18, 1955 Today's Date: 05/05/2023   History of present illness 68 y.o. male presents to [facility] 03/27/23 with productive cough and generalized weakness. Admitted with influenza A and cellulitis of R foot s/p hardware removal of the R ankle and application of wound vac on 2/10, wound vac removed 2/27. Left thigh pain, CT negative. Prior admit 11/24 for R ankle ORIF and in 12/24 for revision. S/p excisional debridement of right ankle on 2/12. Pt underwent central line placement on 04/07/2023 however he has been having issues with recurrent bleeding from the insertion site which has finally stopped. 04/17/23 pt with drop in sats requiring 2 L of O2 via [place], chest xray concerning for edema or possibly pneumonia.Significant PMH: HTN, HLD, CAD s/p PCI, COPD, DM2, CKD stage IIIb, PVD, gout, OSA, neuropathy, COPD, a-fib, CAD s/p percutaneous coronary angioplasty, angina, obesity   OT comments  STAR Patient OT/PT session: Patient received in supine with BP 145/62. Patient able to get to EOB with CGA and BP 104/55. Patient performed dressing from EOB with max assist for LB and supervision for UB and max assist for abdominal binder with BP 121/55. Patient performed standing from EOB with complaints of dizziness and patient asking to sit before BP reading was complete with 102/55. After short rest patient was min assist to transfer to recliner with BP 100/55. Patient performed mobility in hallway with min assist and complaints of dizziness with BP readings 112/87 1st attempt and limited distance, 116/60 2nd attempt, 114/64 3rd attempt, and 102/70 4th attempt with complaints of dizziness with each attempt. Patient up in recliner at end of session with BP 111/54 and patient encouraged to stay up in chair for most of day. Patient to continue to be followed by acute OT with STAR program to address established goals to facilitate DC to  next venue of care.       If plan is discharge home, recommend the following:  A lot of help with bathing/dressing/bathroom;Assistance with cooking/housework;Help with stairs or ramp for entrance;Assist for transportation;Two people to help with walking and/or transfers   Equipment Recommendations  Other (comment) (bariatric BSC)    Recommendations for Other Services      Precautions / Restrictions Precautions Precautions: Fall Recall of Precautions/Restrictions: Impaired Precaution/Restrictions Comments: Watch BP Required Braces or Orthoses: Other Brace Other Brace: abdominal binder (Prn)/ TED Hose on Restrictions Weight Bearing Restrictions Per Provider Order: Yes RLE Weight Bearing Per Provider Order: Weight bearing as tolerated Other Position/Activity Restrictions: per chat with Dr Lajoyce Corners, 3/12 pt no longer needs CAM boot per Dr. Audrie Lia note 04/03/2023 and orders 04/03/2023 pt WBAT       Mobility Bed Mobility Overal bed mobility: Needs Assistance Bed Mobility: Supine to Sit     Supine to sit: Contact guard, HOB elevated, Used rails     General bed mobility comments: CGA with trunk    Transfers Overall transfer level: Needs assistance Equipment used: Rolling walker (2 wheels) Transfers: Sit to/from Stand, Bed to chair/wheelchair/BSC Sit to Stand: Min assist, From elevated surface     Step pivot transfers: Min assist     General transfer comment: min A for stand pivot for inital BP, and for pivot transfer from elevated bed to chair. Pt attempts to come to standing from seated with hips back in chair and quickly sits back down before achieving upright, pt reminded to scoot hips forward prior to attempting sit to stand. Pt is min A  for sit >stand from recliner for 3 bouts of ambulation     Balance Overall balance assessment: Needs assistance Sitting-balance support: No upper extremity supported, Feet supported Sitting balance-Leahy Scale: Fair Sitting balance -  Comments: able to perform dressing seated on EOB Postural control: Posterior lean Standing balance support: Bilateral upper extremity supported, During functional activity, Reliant on assistive device for balance Standing balance-Leahy Scale: Poor Standing balance comment: Pt reliant on RW. Reports of dizziness and lightheadedness upon each attempt at standing, with encouragement able to start ambulation.                           ADL either performed or assessed with clinical judgement   ADL Overall ADL's : Needs assistance/impaired     Grooming: Wash/dry hands;Wash/dry face;Oral care;Set up;Sitting Grooming Details (indicate cue type and reason): on EOB         Upper Body Dressing : Supervision/safety;Sitting Upper Body Dressing Details (indicate cue type and reason): to donn T-shirt seated on EOB Lower Body Dressing: Maximal assistance;Sitting/lateral leans;Sit to/from stand Lower Body Dressing Details (indicate cue type and reason): assistance with donning underwear, pants, and shoes. Patient stands to pull up clothing with max assist due to dependent on UE support             Functional mobility during ADLs: Minimal assistance      Extremity/Trunk Assessment              Vision       Perception     Praxis     Communication Communication Communication: Impaired   Cognition Arousal: Alert Behavior During Therapy: [facility] for tasks assessed/performed                                 Following commands: Impaired Following commands impaired: Follows one step commands inconsistently, Follows one step commands with increased time      Cueing   Cueing Techniques: Verbal cues, Tactile cues  Exercises      Shoulder Instructions       General Comments See OT note for BP measurements    Pertinent Vitals/ Pain       Pain Assessment Pain Assessment: Faces Faces Pain Scale: Hurts a little bit Pain Location: generalized Pain  Descriptors / Indicators: Discomfort, Grimacing Pain Intervention(s): Limited activity within patient's tolerance, Monitored during session, Repositioned  Home Living                                          Prior Functioning/Environment              Frequency  Min 2X/week        Progress Toward Goals  OT Goals(current goals can now be found in the care plan section)  Progress towards OT goals: Progressing toward goals  Acute Rehab OT Goals Patient Stated Goal: to get better OT Goal Formulation: With patient Time For Goal Achievement: 05/14/23 Potential to Achieve Goals: Good ADL Goals Pt Will Perform Grooming: with set-up;sitting Pt Will Perform Lower Body Dressing: with min assist;with adaptive equipment;with caregiver independent in assisting;sitting/lateral leans;sit to/from stand Pt Will Transfer to Toilet: stand pivot transfer;with min assist;bedside commode Pt Will Perform Toileting - Clothing Manipulation and hygiene: with min assist;sitting/lateral leans;sit to/from stand Pt/caregiver will Perform Home Exercise Program:  Increased strength;Both right and left upper extremity;With theraband;With written HEP provided Additional ADL Goal #1: Pt will be Mod I in and OOB for basic ADLs  Plan      Co-evaluation    PT/OT/SLP Co-Evaluation/Treatment: Yes Reason for Co-Treatment: For patient/therapist safety;To address functional/ADL transfers PT goals addressed during session: Mobility/safety with mobility;Proper use of DME;Balance OT goals addressed during session: ADL's and self-care;Strengthening/ROM      AM-PAC OT "6 Clicks" Daily Activity     Outcome Measure   Help from another person eating meals?: None Help from another person taking care of personal grooming?: A Little Help from another person toileting, which includes using toliet, bedpan, or urinal?: A Lot Help from another person bathing (including washing, rinsing, drying)?: A  Lot Help from another person to put on and taking off regular upper body clothing?: A Little Help from another person to put on and taking off regular lower body clothing?: A Lot 6 Click Score: 16    End of Session Equipment Utilized During Treatment: Gait belt;Rolling walker (2 wheels)  OT Visit Diagnosis: Unsteadiness on feet (R26.81);Other abnormalities of gait and mobility (R26.89);Muscle weakness (generalized) (M62.81)   Activity Tolerance Patient tolerated treatment well   Patient Left in chair;with call bell/phone within reach;with chair alarm set   Nurse Communication Mobility status        Time: 1010-1050 OT Time Calculation (min): 40 min  Charges: OT General Charges $OT Visit: 1 Visit OT Treatments $Self Care/Home Management : 8-22 mins $Therapeutic Activity: 8-22 mins  Alfonse Flavors, OTA Acute Rehabilitation Services  Office [phone]   Dewain Penning 05/05/2023, 3:14 PM

## 2023-05-05 NOTE — Progress Notes (Signed)
 Harold [name] PROGRESS NOTE  Assessment/ Plan: Dialysis on Saturday Pt is a 68 y.o. yo male    # Dialysis dependent AKI on CKD 3; ATN and AIN both possible. -CLIP as AKI to [place] start 3/11 if needed. -  He is nonoliguric - BUN and crt rising more slowly in between tx.   Last dialysis on Saturday;  plan for next HD Tuesday and continue outpatient schedule ( TTS) but continue to watch for signs of recovery; will write orders for him tomorrow and check on him first thing in the morning.  He thinks he is making more urine but does not which recorded.  # Hypertension with tendency towards orthostasis on low-dose carvedilol and tamsulosin. Ease off on UF as seems euvolemic and is nonoliguric  # Nephrotic range proteinuria with negative serological evaluation but decision against biopsy at the current time per previous notes.  # Right lower extremity infection with involvement of orthopedic hardware status post removal and antibiotics discontinued 2/28 with concern for AIN.  #Mild hyponatremia, continue to limit free water intake.  Corrected with more UF with HD  # Increased anion gap metabolic acidosis, managed with dialysis.  # Anemia-   iron ok  and added ESA   # Dispo-  now looking into LTAC ar at least increasing stamina and strength prior to discharge   Subjective:   no new issues -  s/p HD Sat.   Due to stamina/strength issues-  looking into possible LTAC  -  500 of UOP  Objective Vital signs in last 24 hours: Vitals:   05/05/23 0043 05/05/23 0418 05/05/23 0537 05/05/23 0814  BP: (!) 145/61 (!) 151/58  (!) 179/59  Pulse: 61 (!) 59  63  Resp: 18 18  18   Temp: 97.6 F (36.4 C) 97.7 F (36.5 C)  97.9 F (36.6 C)  TempSrc: Axillary Axillary  Axillary  SpO2: 95% 97%  100%  Weight:   123.5 kg   Height:       Weight change: -1.1 kg  Intake/Output Summary (Last 24 hours) at 05/05/2023 1300 Last data filed at 05/05/2023 1108 Gross per 24 hour  Intake 3 ml   Output 500 ml  Net -497 ml       Labs: RENAL PANEL Recent Labs  Lab 04/29/23 1015 04/30/23 0205 05/01/23 0704 05/03/23 0900 05/05/23 0547  NA 136 134* 135 135 136  K 3.8 3.2* 3.7 3.7 4.3  CL 100 97* 99 96* 100  CO2 23 27 25 24 27   GLUCOSE 159* 167* 151* 109* 267*  BUN 74* 35* 44* 40* 34*  CREATININE 9.31* 5.36* 6.15* 5.35* 4.68*  CALCIUM 8.9 8.9 8.5* 8.4* 8.5*  PHOS 5.9* 3.0 4.5 4.5 4.3  ALBUMIN 2.5* 2.7* 2.5* 2.7* 2.6*    Liver Function Tests: Recent Labs  Lab 05/01/23 0704 05/03/23 0900 05/05/23 0547  ALBUMIN 2.5* 2.7* 2.6*   No results for input(s): "LIPASE", "AMYLASE" in the last 168 hours. No results for input(s): "AMMONIA" in the last 168 hours. CBC: Recent Labs    03/28/23 0810 03/29/23 0407 04/26/23 0705 04/27/23 0530 04/28/23 0530 05/01/23 0703 05/03/23 0900  HGB  --    < > 7.9* 7.7* 7.8* 7.9* 8.3*  MCV  --    < > 85.7 86.1 86.6 88.1 89.6  VITAMINB12 305  --   --   --   --   --   --   FERRITIN  --   --   --   --   --   --  179  TIBC  --   --   --   --   --   --  190*  IRON  --   --   --   --   --   --  43*   < > = values in this interval not displayed.    Cardiac Enzymes: No results for input(s): "CKTOTAL", "CKMB", "CKMBINDEX", "TROPONINI" in the last 168 hours.  CBG: Recent Labs  Lab 05/04/23 2001 05/04/23 2334 05/05/23 0513 05/05/23 0539 05/05/23 1158  GLUCAP 234* 244* 260* 220* 157*    Iron Studies:  Recent Labs    05/03/23 0900  IRON 43*  TIBC 190*  FERRITIN 179   Studies/Results: No results found.   Medications: Infusions:    Scheduled Medications:  allopurinol  50 mg Oral Daily   amiodarone  200 mg Oral BID   apixaban  5 mg Oral BID   atorvastatin  40 mg Oral Daily   Chlorhexidine Gluconate Cloth  6 each Topical Q0600   Chlorhexidine Gluconate Cloth  6 each Topical Q0600   clopidogrel  75 mg Oral Q breakfast   darbepoetin (ARANESP) injection - DIALYSIS  200 mcg Subcutaneous Q Fri-1800   docusate sodium   100 mg Oral BID   DULoxetine  60 mg Oral Daily   ezetimibe  10 mg Oral Daily   gabapentin  300 mg Oral QHS   Gerhardt's butt cream   Topical TID   guaiFENesin  600 mg Oral BID   insulin aspart  0-9 Units Subcutaneous Q6H   insulin glargine  50 Units Subcutaneous Daily   levothyroxine  88 mcg Oral QAC breakfast   polyethylene glycol  17 g Oral Daily   sodium chloride flush  3 mL Intravenous Q12H   sodium chloride flush  3-10 mL Intravenous Q12H   tamsulosin  0.4 mg Oral Daily    have reviewed scheduled and prn medications.  Physical Exam: General:NAD, comfortable Heart:RRR, s1s2 nl Lungs: Reduced breath sound bilateral basal Abdomen:soft, Non-tender, distended Extremities:no edema  Dialysis Access: LIJ TDC in place  Kendall Justo W 05/05/2023,1:00 PM  LOS: 39 days

## 2023-05-05 NOTE — Progress Notes (Signed)
 Contacted [facility] admissions and [facility] to be advised that insurance auth for LTAC is currently pending. Will assist as needed.   Olivia Canter Renal Navigator [phone]

## 2023-05-05 NOTE — Progress Notes (Signed)
 PROGRESS NOTE    Harold [name]  WUJ:[id number] DOB: [birth year] DOA: 03/27/2023 PCP: [name], MD   Brief Narrative:  Patient is a 68 year old male with morbid obesity, OSA, DM2, HTN, HLD, CAD/stent, A-fib on Eliquis, carotid artery stenosis, CKD, chronic anemia, anxiety/depression, diabetic neuropathy, GERD, hypothyroidism, COPD, pulmonary hypertension.  Patient presented with fever, cough, myalgia and generalized weakness and was found to be positive for influenza A.  Chest x-ray was negative for infiltrates.  Workup done during the hospital stay revealed right lower extremity wound infection/osteomyelitis of right ankle status post removal of hardware.  Cultures grew Enterobacter cloacae and MRSE.  Right lower extremity wound is healing well.  Antibiotics were managed by ID.  Patient monitored off antibiotics for a week, ID eventually signed off on 04/29/2023.  Apparently, patient developed worsening renal function on antibiotics.  Patient is on intermittent dialysis for severe and dense ATN.  Nephrology input is appreciated.   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 -Chest x-ray did not reveal any infiltrates.   -Improved with symptomatic treatment.  Patient has been on room air for very long time.   Right leg wound infection/osteomyelitis  right ankle s/p hardware removal Enterobacter Cloacea, MRSE  -Prior right ankle surgery (12/2022) complicated by postsurgical wound infection. -S/p right ankle hardware removal, I&D and wound VAC placement on 2/10 by Dr. Jena Gauss -S/p repeat debridement of the right ankle by Dr. Lajoyce Corners 04/02/2023.   -ID was consulted, initially on cefepime, subsequently on daptomycin and ertapenem, eventually antibiotics held due to worsening renal function.  Patient doing well without further signs of infection so ID eventually signed off on 04/29/2023.  No plan of antibiotics.   AKI on CKD stage IIIb / Nephrotic range proteinuria, uremia, Anion gap metabolic acidosis: -Baseline creatinine appears to be 1.9-2.2.   -Renal function has continued to progressively worsen -Nephrology following, renal biopsy deferred due to high risk of bleeding.  Patient was also on Plavix for recent stent for PAD  -TDC placed on 3/4, underwent first HD session and now is receiving intermittent dialysis.  Appreciate nephrology help.  Plan for him to start outpatient dialysis at [facility] dialysis on Tuesday.   Right facial droop -On 3/1 patient noted to have right facial droop, evaluated by neurology, MRI of the brain negative for acute stroke. -Currently on Plavix.   PAD -Seen by vascular surgery underwent aortogram, right leg angiogram, successful stenting of right superficial femoral artery on 04/01/2023.   -Continue Plavix, Zetia   Acute metabolic encephalopathy -Likely multifactorial, uremia, infection, drug-induced from possibility of cefepime induced encephalopathy, chronic worsening memory issues -Started on hemodialysis Continue Cymbalta, decreased Neurontin 04/26/2023: Encephalopathy resolved.   Type 2 diabetes mellitus / Diabetic neuropathy, POA: A1c 7 on 01/02/2023. PTA meds-Tresiba 60 units daily, Mounjaro.  Patient currently on [facility]  45 units daily and SSI, blood sugar elevated in 200 range, will increase  Semglee to 50 units.   Diabetic neuropathy -Gabapentin decreased due to worsening renal function. (PTA reportedly on gabapentin 1600 mg 3 times daily).  Currently on Cymbalta.   Hypokalemia/hypomagnesemia: resolved.   Paroxysmal A-fib with slow ventricular response: -HR noted to be in low 40s, cardiology consulted, Coreg held, and they recommended continuing amiodarone.  Heart rate has improved but still in bradycardia range in 50s.   -CHADS-VASc of 4.     Essential hypertension Coreg discontinued by cardiology blood pressure started rising, started on amlodipine 5 mg but then patient had orthostatic hypotension so amlodipine was discontinued.  This has been an issue in the past as well.  We will allow slightly higher blood pressure.  Check orthostatics every shift.    Anemia of chronic disease: Baseline hemoglobin between 10.5 11.5.   -H&H stable, borderline   CAD/stent Carotid artery stenosis HLD Continue PTA meds -Plavix, Crestor and Eliquis.   Hypothyroidism Continue Synthroid   Obstructive sleep apnea CPAP at bedtime    Gout Continue allopurinol, colchicine   Orthostatic hypotension -Received brief IV fluids, continue abdominal binder -2D echo showed EF 55 to 60%, basal inferior hypokinesis, G2 DD   Generalized debility -PT had recommended SNF however patient will have to pay out-of-pocket which he is not able to afford.   Dizziness/vertigo/double vision: Intermittent complaints of dizziness.  MRI brain completed on 04/19/2023 was unremarkable.  Low blood pressure is considered to be the cause.  Bradycardia was also suspected as one of the cause, Coreg has been discontinued and his heart rate is improving.  Symptoms have now resolved.   Morbid obesity: Estimated body mass index is 40.14 kg/m as calculated from the following:   Height as of this encounter: 6\' 1"  (1.854 m).   Weight as of this encounter: 138 kg. Weight loss and diet modification  counseled.  Disposition: Patient was medically ready for discharge 04/30/2023 however patient's spouse [name], who is the only other member living in the home was unavailable.  Per TOC, [name] will return in town on Friday.  However PT said "We need to practice wheelchair transfers with spouse which will likely not happen until late Friday. To set him up for success, it would be better if he could stay for dialysis on Saturday and discharge home from dialysis on Saturday" and based on their recommendations, we  planned for him to be discharged on Saturday/05/03/2023 after dialysis. However when PT/OT worked with him again on 05/02/2023, they were trying to simulate a normal day, getting dressed, getting into the wheelchair and walking in the hallway. After walking 25 feet pt rested in wheelchair for 10 minutes after attempting to stand x2 BP dropped to 82/40, pt had tremors in bilateral UE and was slurring his speech. This is with abdominal binder in place and TED hose on L leg.  PT did not think that patient can safely be discharged home.  Discussed with TOC.  Who eventually discussed with patient about LTAC option and [facility]  We are waiting for insurance authorization.  DVT prophylaxis: Place TED hose Start: 05/01/23 1621 Place and maintain sequential compression device Start: 04/15/23 1549 SCDs Start: 04/02/23 1617   Code Status: Full Code  Family Communication:  None present at bedside.  Plan of care discussed with patient in length and he/she verbalized understanding and agreed with it.  Status is: Inpatient Remains inpatient appropriate because: Please  see above under disposition   Estimated body mass index is 35.92 kg/m as calculated from the following:   Height as of this encounter: 6\' 1"  (1.854 m).   Weight as of this encounter: 123.5 kg.  Pressure Injury 04/17/23 Ankle Right;Anterior Deep Tissue Pressure Injury - Purple or maroon localized area of discolored intact  skin or blood-filled blister due to damage of underlying soft tissue from pressure and/or shear. (Active)  04/17/23 2040  Location: Ankle  Location Orientation: Right;Anterior  Staging: Deep Tissue Pressure Injury - Purple or maroon localized area of discolored intact skin or blood-filled blister due to damage of underlying soft tissue from pressure and/or shear.  Wound Description (Comments):   Present on Admission: Yes  Dressing Type Compression wrap 05/04/23 2000   Nutritional Assessment: Body mass index is 35.92 kg/m.[place] Kitchen Seen by dietician.  I agree with the assessment and plan as outlined below: Nutrition Status:        . Skin Assessment: I have examined the patient's skin and I agree with the wound assessment as performed by the wound care RN as outlined below: Pressure Injury 04/17/23 Ankle Right;Anterior Deep Tissue Pressure Injury - Purple or maroon localized area of discolored intact skin or blood-filled blister due to damage of underlying soft tissue from pressure and/or shear. (Active)  04/17/23 2040  Location: Ankle  Location Orientation: Right;Anterior  Staging: Deep Tissue Pressure Injury - Purple or maroon localized area of discolored intact skin or blood-filled blister due to damage of underlying soft tissue from pressure and/or shear.  Wound Description (Comments):   Present on Admission: Yes  Dressing Type Compression wrap 05/04/23 2000    Consultants:  Cardiology  Procedures:  As above  Antimicrobials:   he has no complaints at all.  Objective: Vitals:   05/04/23 2347 05/05/23 0043 05/05/23 0418 05/05/23 0537  BP:  (!) 145/61 (!) 151/58   Pulse:  61 (!) 59   Resp:  18 18   Temp: 98.4 F (36.9 C) 97.6 F (36.4 C) 97.7 F (36.5 C)   TempSrc: Oral Axillary Axillary   SpO2:  95% 97%   Weight:    123.5 kg  Height:        Intake/Output Summary (Last 24 hours) at 05/05/2023 0744 Last data filed at 05/05/2023 0540 Gross per 24 hour  Intake --  Output  500 ml  Net -500 ml   Filed Weights   05/03/23 0851 05/04/23 0500 05/05/23 0537  Weight: 124.6 kg 120.1 kg 123.5 kg   Subjective: Patient seen and examined.  He has no complaints at all.  Examination:  General exam: Appears calm and comfortable  Respiratory system: Clear to auscultation. Respiratory effort normal. Cardiovascular system: S1 & S2 heard, RRR. No JVD, murmurs, rubs, gallops or clicks. No pedal edema. Gastrointestinal system: Abdomen is nondistended, soft and nontender. No organomegaly or masses felt. Normal bowel sounds heard. Central nervous system: Alert and oriented. No focal neurological deficits. Extremities: Symmetric 5 x 5 power. Skin: No rashes, lesions or ulcers.  Psychiatry: Judgement and insight appear normal. Mood & affect appropriate.    Data Reviewed: I have personally reviewed following labs and imaging studies  CBC: Recent Labs  Lab 05/01/23 0703 05/03/23 0900  WBC 6.8 8.5  HGB 7.9* 8.3*  HCT 23.7* 25.1*  MCV 88.1 89.6  PLT 187 195   Basic Metabolic Panel: Recent Labs  Lab 04/29/23 1015 04/30/23 0205 05/01/23 0704 05/03/23 0900 05/05/23 0547  NA 136 134* 135 135 136  K  3.8 3.2* 3.7 3.7 4.3  CL 100 97* 99 96* 100  CO2 23 27 25 24 27   GLUCOSE 159* 167* 151* 109* 267*  BUN 74* 35* 44* 40* 34*  CREATININE 9.31* 5.36* 6.15* 5.35* 4.68*  CALCIUM 8.9 8.9 8.5* 8.4* 8.5*  PHOS 5.9* 3.0 4.5 4.5 4.3   GFR: Estimated Creatinine Clearance: 20.5 mL/min (A) (by C-G formula based on SCr of 4.68 mg/dL (H)). Liver Function Tests: Recent Labs  Lab 04/29/23 1015 04/30/23 0205 05/01/23 0704 05/03/23 0900 05/05/23 0547  ALBUMIN 2.5* 2.7* 2.5* 2.7* 2.6*   No results for input(s): "LIPASE", "AMYLASE" in the last 168 hours. No results for input(s): "AMMONIA" in the last 168 hours. Coagulation Profile: No results for input(s): "INR", "PROTIME" in the last 168 hours. Cardiac Enzymes: No results for input(s): "CKTOTAL", "CKMB", "CKMBINDEX",  "TROPONINI" in the last 168 hours.  BNP (last 3 results) No results for input(s): "PROBNP" in the last 8760 hours. HbA1C: No results for input(s): "HGBA1C" in the last 72 hours. CBG: Recent Labs  Lab 05/04/23 1250 05/04/23 2001 05/04/23 2334 05/05/23 0513 05/05/23 0539  GLUCAP 129* 234* 244* 260* 220*   Lipid Profile: No results for input(s): "CHOL", "HDL", "LDLCALC", "TRIG", "CHOLHDL", "LDLDIRECT" in the last 72 hours. Thyroid Function Tests: No results for input(s): "TSH", "T4TOTAL", "FREET4", "T3FREE", "THYROIDAB" in the last 72 hours. Anemia Panel: Recent Labs    05/03/23 0900  FERRITIN 179  TIBC 190*  IRON 43*   Sepsis Labs: No results for input(s): "PROCALCITON", "LATICACIDVEN" in the last 168 hours.  No results found for this or any previous visit (from the past 240 hours).   Radiology Studies: No results found.   Scheduled Meds:  allopurinol  50 mg Oral Daily   amiodarone  200 mg Oral BID   apixaban  5 mg Oral BID   atorvastatin  40 mg Oral Daily   Chlorhexidine Gluconate Cloth  6 each Topical Q0600   Chlorhexidine Gluconate Cloth  6 each Topical Q0600   clopidogrel  75 mg Oral Q breakfast   darbepoetin (ARANESP) injection - DIALYSIS  200 mcg Subcutaneous Q Fri-1800   docusate sodium  100 mg Oral BID   DULoxetine  60 mg Oral Daily   ezetimibe  10 mg Oral Daily   gabapentin  300 mg Oral QHS   Gerhardt's butt cream   Topical TID   guaiFENesin  600 mg Oral BID   insulin aspart  0-9 Units Subcutaneous Q6H   insulin glargine  50 Units Subcutaneous Daily   levothyroxine  88 mcg Oral QAC breakfast   polyethylene glycol  17 g Oral Daily   sodium chloride flush  3 mL Intravenous Q12H   sodium chloride flush  3-10 mL Intravenous Q12H   tamsulosin  0.4 mg Oral Daily   Continuous Infusions:   LOS: 39 days   Hughie Closs, MD [facility]  05/05/2023, 7:44 AM   *Please note that this is a verbal dictation therefore any spelling or grammatical errors  are due to the "Dragon Medical One" system interpretation.  Please page via Amion and do not message via secure chat for urgent patient care matters. Secure chat can be used for non urgent patient care matters.  How to contact the [facility] Attending or Consulting provider 7A - 7P or covering provider during after hours 7P -7A, for this patient?  Check the care team in [facility] and look for a) attending/consulting TRH provider listed and b) the [facility] team listed. Page or  secure chat 7A-7P. Log into [email or web address] and use [place]'s universal password to access. If you do not have the password, please contact the hospital operator. Locate the [facility] provider you are looking for under [facility] and page to a number that you can be directly reached. If you still have difficulty reaching the provider, please page the [facility] (Director on Call) for the Hospitalists listed on amion for assistance.

## 2023-05-05 NOTE — Progress Notes (Signed)
 Physical Therapy Treatment Patient Details Name: Harold [name] MRN: [id number] DOB: May 25, 1955 Today's Date: 05/05/2023   History of Present Illness 68 y.o. male presents to [facility] 03/27/23 with productive cough and generalized weakness. Admitted with influenza A and cellulitis of R foot s/p hardware removal of the R ankle and application of wound vac on 2/10, wound vac removed 2/27. Left thigh pain, CT negative. Prior admit 11/24 for R ankle ORIF and in 12/24 for revision. S/p excisional debridement of right ankle on 2/12. Pt underwent central line placement on 04/07/2023 however he has been having issues with recurrent bleeding from the insertion site which has finally stopped. 04/17/23 pt with drop in sats requiring 2 L of O2 via [place], chest xray concerning for edema or possibly pneumonia.Significant PMH: HTN, HLD, CAD s/p PCI, COPD, DM2, CKD stage IIIb, PVD, gout, OSA, neuropathy, COPD, a-fib, CAD s/p percutaneous coronary angioplasty, angina, obesity    PT Comments  STAR PT/OT Session: Continue to work on pt tolerance to upright positioning and OOB activity. Pt sitting EoB after getting dressed with OT. Pt continues to have symptomatic orthostatic hypotension with standing and walking (see OT notes). Pt requiring vc for initial sequencing of sit>stand, but then is able to carryover through remainder of session. Pt continues to be limited in distance of ambulation due to decreased strength and onset of dizziness from BP drop. Pt is overall min-modA for safety with ambulation. PT continues to recommend PT at [facility] for progression of tolerance of exercise and dialysis to be able to discharge home with spouse as soon as safely able.     If plan is discharge home, recommend the following: A lot of help with walking and/or transfers;A lot of help with bathing/dressing/bathroom;Assistance with cooking/housework;Assist for transportation;Help with stairs or ramp for entrance   Can travel by private vehicle      Yes  Equipment Recommendations  BSC/3in1       Precautions / Restrictions Precautions Precautions: Fall Recall of Precautions/Restrictions: Impaired Precaution/Restrictions Comments: Watch BP Required Braces or Orthoses: Other Brace Other Brace: abdominal binder (Prn)/ TED Hose on Restrictions Weight Bearing Restrictions Per Provider Order: Yes RLE Weight Bearing Per Provider Order: Weight bearing as tolerated Other Position/Activity Restrictions: per chat with Dr Lajoyce Corners, 3/12 pt no longer needs CAM boot per Dr. Audrie Lia note 04/03/2023 and orders 04/03/2023 pt WBAT     Mobility  Bed Mobility               General bed mobility comments: dressed sitting on EoB with OTA on entry    Transfers Overall transfer level: Needs assistance Equipment used: Rolling walker (2 wheels) Transfers: Sit to/from Stand, Bed to chair/wheelchair/BSC Sit to Stand: Min assist, From elevated surface   Step pivot transfers: Min assist       General transfer comment: min A for stand pivot for inital BP, and for pivot transfer from elevated bed to chair. Pt attempts to come to standing from seated with hips back in chair and quickly sits back down before achieving upright, pt reminded to scoot hips forward prior to attempting sit to stand. Pt is min A for sit >stand from recliner for 3 bouts of ambulation    Ambulation/Gait Ambulation/Gait assistance: Min assist, Mod assist Gait Distance (Feet): 2 Feet (+25, +30) Assistive device: Rolling walker (2 wheels) Gait Pattern/deviations: Step-through pattern, Trunk flexed, Decreased step length - right, Decreased step length - left Gait velocity: reduced     General Gait Details: pt continues to be  limited by symptomatic orthostatic hypotension, and decreased activity tolerance with ambulation, overall min-mod A for steadying. Pt initiates gait with strong, stepping and improved R LE foot clearance. but with fatigue has decreasing foot clearance.      Balance Overall balance assessment: Needs assistance Sitting-balance support: No upper extremity supported, Feet supported Sitting balance-Leahy Scale: Fair Sitting balance - Comments: able to perform dressing seated on EOB Postural control: Posterior lean Standing balance support: Bilateral upper extremity supported, During functional activity, Reliant on assistive device for balance Standing balance-Leahy Scale: Poor Standing balance comment: Pt reliant on RW. Reports of dizziness and lightheadedness upon each attempt at standing, with encouragement able to start ambulation.                            Communication Communication Communication: Impaired  Cognition Arousal: Alert Behavior During Therapy: WFL for tasks assessed/performed                           PT - Cognition Comments: continues to have poor understanding of the severity of his condition Following commands: Impaired Following commands impaired: Follows one step commands inconsistently, Follows one step commands with increased time    Cueing Cueing Techniques: Verbal cues, Tactile cues     General Comments General comments (skin integrity, edema, etc.): See OT note for BP measurements      Pertinent Vitals/Pain Pain Assessment Pain Assessment: Faces Faces Pain Scale: Hurts a little bit Pain Location: generalized Pain Descriptors / Indicators: Discomfort, Grimacing Pain Intervention(s): Limited activity within patient's tolerance, Monitored during session, Repositioned     PT Goals (current goals can now be found in the care plan section) Acute Rehab PT Goals PT Goal Formulation: With patient Time For Goal Achievement: 05/08/23 Potential to Achieve Goals: Fair Progress towards PT goals: Progressing toward goals    Frequency    Min 1X/week       Co-evaluation PT/OT/SLP Co-Evaluation/Treatment: Yes Reason for Co-Treatment: For patient/therapist safety;To address  functional/ADL transfers PT goals addressed during session: Mobility/safety with mobility;Proper use of DME;Balance OT goals addressed during session: ADL's and self-care;Strengthening/ROM      AM-PAC PT "6 Clicks" Mobility   Outcome Measure  Help needed turning from your back to your side while in a flat bed without using bedrails?: A Little Help needed moving from lying on your back to sitting on the side of a flat bed without using bedrails?: A Little Help needed moving to and from a bed to a chair (including a wheelchair)?: A Little Help needed standing up from a chair using your arms (e.g., wheelchair or bedside chair)?: A Lot Help needed to walk in hospital room?: Total Help needed climbing 3-5 steps with a railing? : Total 6 Click Score: 13    End of Session Equipment Utilized During Treatment: Gait belt Activity Tolerance: Patient limited by fatigue;Treatment limited secondary to medical complications (Comment) (symptomatic hypotension) Patient left: in chair;with call bell/phone within reach;with chair alarm set (encouraged pt to sit up at least 3 hours) Nurse Communication: Mobility status PT Visit Diagnosis: Other abnormalities of gait and mobility (R26.89);Unsteadiness on feet (R26.81);Muscle weakness (generalized) (M62.81);History of falling (Z91.81);Difficulty in walking, not elsewhere classified (R26.2)     Time: 1025-1050 PT Time Calculation (min) (ACUTE ONLY): 25 min  Charges:    $Gait Training: 8-22 mins PT General Charges $$ ACUTE PT VISIT: 1 Visit  Jinx Gilden B. Beverely Risen PT, DPT Acute Rehabilitation Services Please use secure chat or  Call Office [phone]    Elon Alas Fleet 05/05/2023, 1:30 PM

## 2023-05-05 NOTE — TOC Progression Note (Addendum)
 Transition of Care [facility]) - Progression Note    Patient Details  Name: Harold [name] MRN: [id number] Date of Birth: 1955-05-26  Transition of [facility] CM/SW Contact  Graves-Bigelow, Lamar Laundry, RN Phone Number: 05/05/2023, 12:47 PM  Clinical Narrative: Case Manager received notification that clinicals have been submitted via [facility] to insurance for authorization-awaiting. Case Manager continues to follow for transition of care needs.    Expected Discharge Plan: Long Term Acute Care (LTAC) Barriers to Discharge: No Barriers Identified  Expected Discharge Plan and Services In-house Referral: Clinical Social Work Discharge Planning Services: CM Consult Post Acute Care Choice: Home Health Living arrangements for the past 2 months: Single Family Home Expected Discharge Date: 04/10/23               DME Arranged: Bedside commode (bariatric) DME Agency:  Julianne Rice) Date DME Agency Contacted: 04/29/23 Time DME Agency Contacted: [phone] Representative spoke with at DME Agency: Vaughan Basta HH Arranged: RN, Disease Management, PT, OT, Nurse's Aide HH Agency: [facility]  Social Determinants of Health (SDOH) Interventions SDOH Screenings   Food Insecurity: No Food Insecurity (03/27/2023)  Housing: Low Risk  (03/28/2023)  Transportation Needs: No Transportation Needs (03/27/2023)  Utilities: Not At Risk (03/28/2023)  Social Connections: Unknown (03/28/2023)  Tobacco Use: Medium Risk (04/02/2023)   Readmission Risk Interventions     No data to display

## 2023-05-06 ENCOUNTER — Other Ambulatory Visit (HOSPITAL_COMMUNITY): Payer: Self-pay

## 2023-05-06 DIAGNOSIS — L02415 Cutaneous abscess of right lower limb: Secondary | ICD-10-CM | POA: Diagnosis not present

## 2023-05-06 LAB — CBC
HCT: 24.3 % — ABNORMAL LOW (ref 39.0–52.0)
Hemoglobin: 8 g/dL — ABNORMAL LOW (ref 13.0–17.0)
MCH: 30.1 pg (ref 26.0–34.0)
MCHC: 32.9 g/dL (ref 30.0–36.0)
MCV: 91.4 fL (ref 80.0–100.0)
Platelets: 172 10*3/uL (ref 150–400)
RBC: 2.66 MIL/uL — ABNORMAL LOW (ref 4.22–5.81)
RDW: 16.2 % — ABNORMAL HIGH (ref 11.5–15.5)
WBC: 6.5 10*3/uL (ref 4.0–10.5)
nRBC: 0 % (ref 0.0–0.2)

## 2023-05-06 LAB — GLUCOSE, CAPILLARY
Glucose-Capillary: 147 mg/dL — ABNORMAL HIGH (ref 70–99)
Glucose-Capillary: 162 mg/dL — ABNORMAL HIGH (ref 70–99)
Glucose-Capillary: 192 mg/dL — ABNORMAL HIGH (ref 70–99)
Glucose-Capillary: 197 mg/dL — ABNORMAL HIGH (ref 70–99)
Glucose-Capillary: 241 mg/dL — ABNORMAL HIGH (ref 70–99)
Glucose-Capillary: 97 mg/dL (ref 70–99)

## 2023-05-06 LAB — RENAL FUNCTION PANEL
Albumin: 2.7 g/dL — ABNORMAL LOW (ref 3.5–5.0)
Anion gap: 12 (ref 5–15)
BUN: 34 mg/dL — ABNORMAL HIGH (ref 8–23)
CO2: 26 mmol/L (ref 22–32)
Calcium: 8.9 mg/dL (ref 8.9–10.3)
Chloride: 99 mmol/L (ref 98–111)
Creatinine, Ser: 4.98 mg/dL — ABNORMAL HIGH (ref 0.61–1.24)
GFR, Estimated: 12 mL/min — ABNORMAL LOW (ref >60)
Glucose, Bld: 169 mg/dL — ABNORMAL HIGH (ref 70–99)
Phosphorus: 4.6 mg/dL (ref 2.5–4.6)
Potassium: 4.2 mmol/L (ref 3.5–5.1)
Sodium: 137 mmol/L (ref 135–145)

## 2023-05-06 MED ORDER — ANTICOAGULANT SODIUM CITRATE 4% (200MG/5ML) IV SOLN: 5.0000 mL | Status: DC | PRN

## 2023-05-06 MED ORDER — HEPARIN SODIUM (PORCINE) 1000 UNIT/ML DIALYSIS: 1000.0000 [IU] | INTRAMUSCULAR | Status: DC | PRN

## 2023-05-06 MED ORDER — PENTAFLUOROPROP-TETRAFLUOROETH EX AERO: 1.0000 | INHALATION_SPRAY | CUTANEOUS | Status: DC | PRN

## 2023-05-06 MED ORDER — INSULIN GLARGINE 100 UNIT/ML ~~LOC~~ SOLN
28.0000 [IU] | Freq: Two times a day (BID) | SUBCUTANEOUS | Status: DC
  Administered 2023-05-06 – 2023-05-10 (×9): 28 [IU] via SUBCUTANEOUS
  Filled 2023-05-06 (×11): qty 0.28

## 2023-05-06 MED ORDER — ALTEPLASE 2 MG IJ SOLR: 2.0000 mg | Freq: Once | INTRAMUSCULAR | Status: DC | PRN

## 2023-05-06 MED ORDER — HEPARIN SODIUM (PORCINE) 1000 UNIT/ML IJ SOLN
4200.0000 [IU] | Freq: Once | INTRAMUSCULAR | Status: DC
  Filled 2023-05-06: qty 5

## 2023-05-06 MED ORDER — LIDOCAINE HCL (PF) 1 % IJ SOLN: 5.0000 mL | INTRAMUSCULAR | Status: DC | PRN

## 2023-05-06 MED ORDER — LIDOCAINE-PRILOCAINE 2.5-2.5 % EX CREA: 1.0000 | TOPICAL_CREAM | CUTANEOUS | Status: DC | PRN

## 2023-05-06 NOTE — Plan of Care (Signed)

## 2023-05-06 NOTE — Progress Notes (Addendum)
 Physical Therapy Treatment Patient Details Name: Harold [name] MRN: [id number] DOB: 12/01/1955 Today's Date: 05/06/2023   History of Present Illness 68 y.o. male presents to [facility] 03/27/23 with productive cough and generalized weakness. Admitted with influenza A and cellulitis of R foot s/p hardware removal of the R ankle and application of wound vac on 2/10, wound vac removed 2/27. Left thigh pain, CT negative. Prior admit 11/24 for R ankle ORIF and in 12/24 for revision. S/p excisional debridement of right ankle on 2/12. Pt underwent central line placement on 04/07/2023 however he has been having issues with recurrent bleeding from the insertion site which has finally stopped. 04/17/23 pt with drop in sats requiring 2 L of O2 via [place], chest xray concerning for edema or possibly pneumonia.Significant PMH: HTN, HLD, CAD s/p PCI, COPD, DM2, CKD stage IIIb, PVD, gout, OSA, neuropathy, COPD, a-fib, CAD s/p percutaneous coronary angioplasty, angina, obesity    PT Comments  [facility] PT/OT Session: Pt seen immediately after hemodialysis and initially reports being too fatigued to work with therapy. With encouragement and education that he will need to be able to get up from dialysis chair, and get to car and back out of car and into his home before he can lay down, pt agreeable to therapy. Pt with significant drop in systolic BP with mobilization today (see below) Pt is able to come to EoB with increased effort and heavy use of bed rail. With assistance pt is able to place abdominal binder. Pt progresses his ambulation with RW and minA-minAx2 for steadying with gait. Pt requires seated rest break in hallway, as he fatigues and exhibits increasing pallor and diaphoresis. After 5 min seated rest break, able to return to room with min Ax2 to sit on EoB. Finally, pt performs stand step transfer from EoB to recliner with min A and is agreeable to sit up through lunch. Given pt's continued drop in BP with positional change,  PT continues to recommend continued therapy at [facility]. Discharge home pt jeopardizes not only his safety but that of his significant other. PT will continue to follow acutely.  Orthostatic BPs  Supine 149/62  Sitting 128/59  After ambulation  94/60  After 5 min seated rest break and ambulation back to room  88/67  After 5 min seated rest break EOB and step pivot transfer to recliner  117/60      If plan is discharge home, recommend the following: A lot of help with walking and/or transfers;A lot of help with bathing/dressing/bathroom;Assistance with cooking/housework;Assist for transportation;Help with stairs or ramp for entrance   Can travel by private vehicle     Yes  Equipment Recommendations  None recommended by PT       Precautions / Restrictions Precautions Precautions: Fall Recall of Precautions/Restrictions: Impaired Precaution/Restrictions Comments: Watch BP Required Braces or Orthoses: Other Brace Other Brace: abdominal binder (Prn) Restrictions Weight Bearing Restrictions Per Provider Order: Yes RLE Weight Bearing Per Provider Order: Weight bearing as tolerated Other Position/Activity Restrictions: per chat with Dr Lajoyce Corners, 3/12 pt no longer needs CAM boot per Dr. Audrie Lia note 04/03/2023 and orders 04/03/2023 pt WBAT     Mobility  Bed Mobility Overal bed mobility: Needs Assistance Bed Mobility: Supine to Sit     Supine to sit: Contact guard, HOB elevated, Used rails     General bed mobility comments: contact guard for safety, increased effort, time and  heavy use of rails to pull to seated EoB    Transfers Overall transfer level: Needs  assistance Equipment used: Rolling walker (2 wheels) Transfers: Sit to/from Stand, Bed to chair/wheelchair/BSC Sit to Stand: Min assist, From elevated surface, +2 physical assistance   Step pivot transfers: Min assist       General transfer comment: min A for power up from elevated bed surface for ambulation, minAx2 for power  up from lower recliner surface,    Ambulation/Gait Ambulation/Gait assistance: Min assist, +2 physical assistance Gait Distance (Feet): 65 Feet Assistive device: Rolling walker (2 wheels) Gait Pattern/deviations: Step-through pattern, Trunk flexed, Decreased step length - right, Decreased step length - left, Shuffle Gait velocity: reduced Gait velocity interpretation: <1.31 ft/sec, indicative of household ambulator   General Gait Details: min A to minA x 2 for ambulation in hallway, initiates strong steady gait with good foot clearance however with distance pt with increasing shuffle and decreased foot clearance and decreasing velocity, after 5 min seated rest break pt ambulates back to room with decreasing velocity and increasing trunk flexion         Balance Overall balance assessment: Needs assistance Sitting-balance support: No upper extremity supported, Feet supported Sitting balance-Leahy Scale: Fair     Standing balance support: Bilateral upper extremity supported, During functional activity, Reliant on assistive device for balance Standing balance-Leahy Scale: Poor Standing balance comment: Pt reliant on RW for dynamic activity                            Communication Communication Communication: Impaired  Cognition Arousal: Alert Behavior During Therapy: WFL for tasks assessed/performed   PT - Cognitive impairments: Safety/Judgement                       PT - Cognition Comments: continues to have poor understanding of the severity of his condition Following commands: Impaired Following commands impaired: Follows one step commands inconsistently, Follows one step commands with increased time    Cueing Cueing Techniques: Verbal cues, Tactile cues  Exercises General Exercises - Lower Extremity Long Arc Quad: Seated, AROM, Both, 15 reps Hip ABduction/ADduction: AROM, Both, 15 reps, Seated Hip Flexion/Marching: AROM, Both, 15 reps, Seated     General Comments General comments (skin integrity, edema, etc.): Pt with significant drop in systolic BP with mobilization after dialysis, pt denies dizziness but has increasing pallor and is diaphoretic, HR 60-70 bpm throughout session      Pertinent Vitals/Pain Pain Assessment Pain Assessment: Faces Faces Pain Scale: Hurts a little bit Pain Location: generalized Pain Descriptors / Indicators: Discomfort, Grimacing Pain Intervention(s): Limited activity within patient's tolerance, Monitored during session, Repositioned     PT Goals (current goals can now be found in the care plan section) Acute Rehab PT Goals PT Goal Formulation: With patient Time For Goal Achievement: 05/08/23 Potential to Achieve Goals: Fair Progress towards PT goals: Progressing toward goals    Frequency    Min 1X/week           Co-evaluation PT/OT/SLP Co-Evaluation/Treatment: Yes Reason for Co-Treatment: For patient/therapist safety;To address functional/ADL transfers PT goals addressed during session: Mobility/safety with mobility;Proper use of DME;Balance OT goals addressed during session: ADL's and self-care;Strengthening/ROM      AM-PAC PT "6 Clicks" Mobility   Outcome Measure  Help needed turning from your back to your side while in a flat bed without using bedrails?: A Little Help needed moving from lying on your back to sitting on the side of a flat bed without using bedrails?: A Little Help needed moving  to and from a bed to a chair (including a wheelchair)?: A Little Help needed standing up from a chair using your arms (e.g., wheelchair or bedside chair)?: A Lot Help needed to walk in hospital room?: Total Help needed climbing 3-5 steps with a railing? : Total 6 Click Score: 13    End of Session Equipment Utilized During Treatment: Gait belt Activity Tolerance: Patient limited by fatigue;Treatment limited secondary to medical complications (Comment) (orthostatic hypotension) Patient  left: in chair;with call bell/phone within reach;with chair alarm set (encouraged pt to sit up at least 3 hours) Nurse Communication: Mobility status PT Visit Diagnosis: Other abnormalities of gait and mobility (R26.89);Unsteadiness on feet (R26.81);Muscle weakness (generalized) (M62.81);History of falling (Z91.81);Difficulty in walking, not elsewhere classified (R26.2)     Time: 1323-1350 PT Time Calculation (min) (ACUTE ONLY): 27 min  Charges:    $Therapeutic Activity: 8-22 mins PT General Charges $$ ACUTE PT VISIT: 1 Visit                     Espen Bethel B. Beverely Risen PT, DPT Acute Rehabilitation Services Please use secure chat or  Call Office [phone]    Elon Alas [facility] 05/06/2023, 2:27 PM

## 2023-05-06 NOTE — Progress Notes (Signed)
 Received patient in bed to unit.  Alert and oriented.  Informed consent signed and in chart.   TX duration:3 hours  Patient tolerated well.  Transported back to the room  Alert, without acute distress.  Hand-off given to patient's nurse.   Access used:  L internal jugular HD cath Access issues: none  Total UF removed: 0mL Medication(s) given: none   05/06/23 1240  Vitals  Temp (!) 97.3 F (36.3 C)  BP (!) 155/68  Pulse Rate 60  Resp 16  Oxygen Therapy  SpO2 100 %  O2 Device Room Air  Patient Activity (if Appropriate) In bed  Pulse Oximetry Type Continuous  During Treatment Monitoring  HD Safety Checks Performed Yes  Intra-Hemodialysis Comments Tx completed  Dialysis Fluid Bolus Normal Saline  Bolus Amount (mL) 300 mL  Post Treatment  Dialyzer Clearance Clear  Liters Processed 72  Fluid Removed (mL) 3000 mL  Tolerated HD Treatment Yes     Stacie Glaze LPN [facility]

## 2023-05-06 NOTE — Plan of Care (Signed)
 Palliative-   Attempted to see patient, however, he was off unit in HD. Will attempt at a later time.   Ocie Bob, AGNP-C Palliative Medicine  No charge

## 2023-05-06 NOTE — Progress Notes (Signed)
 PROGRESS NOTE    Harold [name]  NWG:[id number] DOB: 02-08-1956 DOA: 03/27/2023 PCP: [name], MD   Brief Narrative:  Patient is a 68 year old male with morbid obesity, OSA, DM2, HTN, HLD, CAD/stent, A-fib on Eliquis, carotid artery stenosis, CKD, chronic anemia, anxiety/depression, diabetic neuropathy, GERD, hypothyroidism, COPD, pulmonary hypertension.  Patient presented with fever, cough, myalgia and generalized weakness and was found to be positive for influenza A.  Chest x-ray was negative for infiltrates.  Workup done during the hospital stay revealed right lower extremity wound infection/osteomyelitis of right ankle status post removal of hardware.  Cultures grew Enterobacter cloacae and MRSE.  Right lower extremity wound is healing well.  Antibiotics were managed by ID.  Patient monitored off antibiotics for a week, ID eventually signed off on 04/29/2023.  Apparently, patient developed worsening renal function on antibiotics.  Patient is on intermittent dialysis for severe and dense ATN.  Nephrology input is appreciated.   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 -Chest x-ray did not reveal any infiltrates.   -Improved with symptomatic treatment.  Patient has been on room air for very long time.   Right leg wound infection/osteomyelitis  right ankle s/p hardware removal Enterobacter Cloacea, MRSE  -Prior right ankle surgery (12/2022) complicated by postsurgical wound infection. -S/p right ankle hardware removal, I&D and wound VAC placement on 2/10 by Dr. Jena Gauss -S/p repeat debridement of the right ankle by Dr. Lajoyce Corners 04/02/2023.   -ID was consulted, initially on cefepime, subsequently on daptomycin and ertapenem, eventually antibiotics held due to worsening renal function.  Patient doing well without further signs of infection so ID eventually signed off on 04/29/2023.  No plan of antibiotics.   AKI on CKD stage IIIb / Nephrotic range proteinuria, uremia, Anion gap metabolic acidosis: -Baseline creatinine appears to be 1.9-2.2.   -Renal function has continued to progressively worsen -Nephrology following, renal biopsy deferred due to high risk of bleeding.  Patient was also on Plavix for recent stent for PAD  -TDC placed on 3/4, underwent first HD session and now is receiving intermittent dialysis.  Appreciate nephrology help.  Plan for him to start outpatient dialysis at [facility] dialysis on Tuesday.   Right facial droop -On 3/1 patient noted to have right facial droop, evaluated by neurology, MRI of the brain negative for acute stroke. -Currently on Plavix.   PAD -Seen by vascular surgery underwent aortogram, right leg angiogram, successful stenting of right superficial femoral artery on 04/01/2023.   -Continue Plavix, Zetia   Acute metabolic encephalopathy -Likely multifactorial, uremia, infection, drug-induced from possibility of cefepime induced encephalopathy, chronic worsening memory issues -Started on hemodialysis Continue Cymbalta, decreased Neurontin 04/26/2023: Encephalopathy resolved.   Type 2 diabetes mellitus / Diabetic neuropathy, POA: A1c 7 on 01/02/2023. PTA meds-Tresiba 60 units daily, Mounjaro.  Patient currently on [facility]  50 units daily and SSI, blood sugar improving but is still elevated.  For  better absorption, I will split the dose and increase some symmetrically and make it 28 units twice daily.   Diabetic neuropathy -Gabapentin decreased due to worsening renal function. (PTA reportedly on gabapentin 1600 mg 3 times daily).  Currently on Cymbalta.   Hypokalemia/hypomagnesemia: resolved.   Paroxysmal A-fib with slow ventricular response: -HR noted to be in low 40s, cardiology consulted, Coreg held, and they recommended continuing amiodarone.  Heart rate has improved but still in bradycardia range in 50s.   -CHADS-VASc of 4.     Essential hypertension Coreg discontinued by cardiology blood pressure started rising, started on amlodipine 5 mg but then patient had orthostatic hypotension so amlodipine was discontinued.  This has been an issue in the past as well.  We will allow slightly higher blood pressure.  Check orthostatics every shift.    Anemia of chronic disease: Baseline hemoglobin between 10.5 11.5.   -H&H stable, borderline   CAD/stent Carotid artery stenosis HLD Continue PTA meds -Plavix, Crestor and Eliquis.   Hypothyroidism Continue Synthroid   Obstructive sleep apnea CPAP at bedtime    Gout Continue allopurinol, colchicine   Orthostatic hypotension -Received brief IV fluids, continue abdominal binder -2D echo showed EF 55 to 60%, basal inferior hypokinesis, G2 DD   Generalized debility -PT had recommended SNF however patient will have to pay out-of-pocket which he is not able to afford.   Dizziness/vertigo/double vision: Intermittent complaints of dizziness.  MRI brain completed on 04/19/2023 was unremarkable.  Low blood pressure is considered to be the cause.  Bradycardia was also suspected as one of the cause, Coreg has been discontinued and his heart rate is improving.  Symptoms have now resolved.   Morbid obesity: Estimated body mass index is 40.14 kg/m as calculated from the following:   Height as of this encounter: 6\' 1"  (1.854 m).   Weight as  of this encounter: 138 kg. Weight loss and diet modification counseled.  Disposition: Patient was medically ready for discharge 04/30/2023 however patient's spouse [name], who is the only other member living in the home was unavailable.  Per TOC, [name] will return in town on Friday.  However PT said "We need to practice wheelchair transfers with spouse which will likely not happen until late Friday. To set him up for success, it would be better if he could stay for dialysis on Saturday and discharge home from dialysis on Saturday" and based on their recommendations, we  planned for him to be discharged on Saturday/05/03/2023 after dialysis. However when PT/OT worked with him again on 05/02/2023, they were trying to simulate a normal day, getting dressed, getting into the wheelchair and walking in the hallway. After walking 25 feet pt rested in wheelchair for 10 minutes after attempting to stand x2 BP dropped to 82/40, pt had tremors in bilateral UE and was slurring his speech. This is with abdominal binder in place and TED hose on L leg.  PT did not think that patient can safely be discharged home.  We are pursuing LTAC at the moment.  P2P was requested by [employer].  I spoke to the medical director Dr. Joseph Art, who denied approval.  TOC and LTAC team was informed.  Patient remains unsafe to discharge home.  DVT prophylaxis: Place TED hose Start: 05/01/23 1621 Place and maintain sequential compression device Start: 04/15/23 1549 SCDs Start: 04/02/23 1617   Code Status: Full Code  Family Communication:  None  present at bedside.  Plan of care discussed with patient in length and he/she verbalized understanding and agreed with it.  Status is: Inpatient Remains inpatient appropriate because: Please see above under disposition   Estimated body mass index is 35.92 kg/m as calculated from the following:   Height as of this encounter: 6\' 1"  (1.854 m).   Weight as of this encounter: 123.5  kg.  Pressure Injury 04/17/23 Ankle Right;Anterior Deep Tissue Pressure Injury - Purple or maroon localized area of discolored intact skin or blood-filled blister due to damage of underlying soft tissue from pressure and/or shear. (Active)  04/17/23 2040  Location: Ankle  Location Orientation: Right;Anterior  Staging: Deep Tissue Pressure Injury - Purple or maroon localized area of discolored intact skin or blood-filled blister due to damage of underlying soft tissue from pressure and/or shear.  Wound Description (Comments):   Present on Admission: Yes  Dressing Type Compression wrap 05/04/23 2000   Nutritional Assessment: Body mass index is 35.92 kg/m.Marland Kitchen Seen by dietician.  I agree with the assessment and plan as outlined below: Nutrition Status:        . Skin Assessment: I have examined the patient's skin and I agree with the wound assessment as performed by the wound care RN as outlined below: Pressure Injury 04/17/23 Ankle Right;Anterior Deep Tissue Pressure Injury - Purple or maroon localized area of discolored intact skin or blood-filled blister due to damage of underlying soft tissue from pressure and/or shear. (Active)  04/17/23 2040  Location: Ankle  Location Orientation: Right;Anterior  Staging: Deep Tissue Pressure Injury - Purple or maroon localized area of discolored intact skin or blood-filled blister due to damage of underlying soft tissue from pressure and/or shear.  Wound Description (Comments):   Present on Admission: Yes  Dressing Type Compression wrap 05/04/23 2000    Consultants:  Cardiology  Procedures:  As above   Objective: Vitals:   05/05/23 1728 05/05/23 2021 05/05/23 2331 05/06/23 0638  BP: (!) 156/63   139/60  Pulse: 60 63 62 (!) 55  Resp: 18 18 17 18   Temp: (!) 97.5 F (36.4 C) 98.3 F (36.8 C)  98.2 F (36.8 C)  TempSrc: Oral Oral  Axillary  SpO2: 100% 98% 98% 100%  Weight:      Height:        Intake/Output Summary (Last 24 hours)  at 05/06/2023 0754 Last data filed at 05/05/2023 1601 Gross per 24 hour  Intake 477 ml  Output 450 ml  Net 27 ml   Filed Weights   05/03/23 0851 05/04/23 0500 05/05/23 0537  Weight: 124.6 kg 120.1 kg 123.5 kg   Subjective: Patient seen and examined in dialysis.  No complaints.  He is aware of the efforts we are doing to bring him to the LTAC.  Examination:  General exam: Appears calm and comfortable  Respiratory system: Clear to auscultation. Respiratory effort normal. Cardiovascular system: S1 & S2 heard, RRR. No JVD, murmurs, rubs, gallops or clicks. No pedal edema. Gastrointestinal system: Abdomen is nondistended, soft and nontender. No organomegaly or masses felt. Normal bowel sounds heard. Central nervous system: Alert and oriented. No focal neurological deficits. Extremities: Symmetric 5 x 5 power. Skin: No rashes, lesions or ulcers.  Psychiatry: Judgement and insight appear normal. Mood & affect appropriate.    Data Reviewed: I have personally reviewed following labs and imaging studies  CBC: Recent Labs  Lab 05/01/23 0703 05/03/23 0900  WBC 6.8 8.5  HGB 7.9* 8.3*  HCT 23.7* 25.1*  MCV  88.1 89.6  PLT 187 195   Basic Metabolic Panel: Recent Labs  Lab 04/29/23 1015 04/30/23 0205 05/01/23 0704 05/03/23 0900 05/05/23 0547  NA 136 134* 135 135 136  K 3.8 3.2* 3.7 3.7 4.3  CL 100 97* 99 96* 100  CO2 23 27 25 24 27   GLUCOSE 159* 167* 151* 109* 267*  BUN 74* 35* 44* 40* 34*  CREATININE 9.31* 5.36* 6.15* 5.35* 4.68*  CALCIUM 8.9 8.9 8.5* 8.4* 8.5*  PHOS 5.9* 3.0 4.5 4.5 4.3   GFR: Estimated Creatinine Clearance: 20.5 mL/min (A) (by C-G formula based on SCr of 4.68 mg/dL (H)). Liver Function Tests: Recent Labs  Lab 04/29/23 1015 04/30/23 0205 05/01/23 0704 05/03/23 0900 05/05/23 0547  ALBUMIN 2.5* 2.7* 2.5* 2.7* 2.6*   No results for input(s): "LIPASE", "AMYLASE" in the last 168 hours. No results for input(s): "AMMONIA" in the last 168  hours. Coagulation Profile: No results for input(s): "INR", "PROTIME" in the last 168 hours. Cardiac Enzymes: No results for input(s): "CKTOTAL", "CKMB", "CKMBINDEX", "TROPONINI" in the last 168 hours.  BNP (last 3 results) No results for input(s): "PROBNP" in the last 8760 hours. HbA1C: No results for input(s): "HGBA1C" in the last 72 hours. CBG: Recent Labs  Lab 05/05/23 1158 05/05/23 1726 05/06/23 0000 05/06/23 0609 05/06/23 0743  GLUCAP 157* 227* 241* 162* 147*   Lipid Profile: No results for input(s): "CHOL", "HDL", "LDLCALC", "TRIG", "CHOLHDL", "LDLDIRECT" in the last 72 hours. Thyroid Function Tests: No results for input(s): "TSH", "T4TOTAL", "FREET4", "T3FREE", "THYROIDAB" in the last 72 hours. Anemia Panel: Recent Labs    05/03/23 0900  FERRITIN 179  TIBC 190*  IRON 43*   Sepsis Labs: No results for input(s): "PROCALCITON", "LATICACIDVEN" in the last 168 hours.  No results found for this or any previous visit (from the past 240 hours).   Radiology Studies: IR Removal Tun Cv Cath W/O FL Result Date: 05/05/2023 INDICATION: 68 year old male with recent osteomyelitis s/p IV antibiotic therapy via right tunneled central venous catheter placed 04/07/23. Catheter no longer needed. EXAM: REMOVAL TUNNELED CENTRAL VENOUS CATHETER MEDICATIONS: None ANESTHESIA/SEDATION: None FLUOROSCOPY: None COMPLICATIONS: None immediate. PROCEDURE: Informed written consent was obtained from the patient after a thorough discussion of the procedural risks, benefits and alternatives. All questions were addressed. Maximal Sterile Barrier Technique was utilized including caps, mask, sterile gowns, sterile gloves, sterile drape, hand hygiene and skin antiseptic. A timeout was performed prior to the initiation of the procedure. The patient's right chest and catheter was prepped and draped in a normal sterile fashion. Heparin was removed from both ports of catheter. 1% lidocaine was used for local  anesthesia. Using manual traction the cuff of the catheter was exposed and the catheter was removed in it's entirety. Pressure was held till hemostasis was obtained. A sterile dressing was applied. The patient tolerated the procedure well with no immediate complications. IMPRESSION: Successful catheter removal as described above. Performed by: Loyce Dys PA-C Electronically Signed   By: Corlis Leak M.D.   On: 05/05/2023 16:05     Scheduled Meds:  allopurinol  50 mg Oral Daily   amiodarone  200 mg Oral BID   apixaban  5 mg Oral BID   atorvastatin  40 mg Oral Daily   Chlorhexidine Gluconate Cloth  6 each Topical Q0600   Chlorhexidine Gluconate Cloth  6 each Topical Q0600   clopidogrel  75 mg Oral Q breakfast   darbepoetin (ARANESP) injection - DIALYSIS  200 mcg Subcutaneous Q Fri-1800  docusate sodium  100 mg Oral BID   DULoxetine  60 mg Oral Daily   ezetimibe  10 mg Oral Daily   gabapentin  300 mg Oral QHS   Gerhardt's butt cream   Topical TID   guaiFENesin  600 mg Oral BID   insulin aspart  0-9 Units Subcutaneous Q6H   insulin glargine  50 Units Subcutaneous Daily   levothyroxine  88 mcg Oral QAC breakfast   polyethylene glycol  17 g Oral Daily   sodium chloride flush  3 mL Intravenous Q12H   sodium chloride flush  3-10 mL Intravenous Q12H   tamsulosin  0.4 mg Oral Daily   Continuous Infusions:   LOS: 40 days   Hughie Closs, MD [facility]  05/06/2023, 7:54 AM   *Please note that this is a verbal dictation therefore any spelling or grammatical errors are due to the "Dragon Medical One" system interpretation.  Please page via Amion and do not message via secure chat for urgent patient care matters. Secure chat can be used for non urgent patient care matters.  How to contact the [facility] Attending or Consulting provider 7A - 7P or covering provider during after hours 7P -7A, for this patient?  Check the care team in [facility] and look for a) attending/consulting TRH provider  listed and b) the [facility] team listed. Page or secure chat 7A-7P. Log into [email or web address] and use [facility] universal password to access. If you do not have the password, please contact the hospital operator. Locate the [facility] provider you are looking for under [facility] and page to a number that you can be directly reached. If you still have difficulty reaching the provider, please page the [facility] (Director on Call) for the Hospitalists listed on amion for assistance.

## 2023-05-06 NOTE — Progress Notes (Signed)
 Occupational Therapy Treatment Patient Details Name: Harold [name] MRN: [id number] DOB: Apr 19, 1955 Today's Date: 05/06/2023   History of present illness 68 y.o. male presents to [facility] 03/27/23 with productive cough and generalized weakness. Admitted with influenza A and cellulitis of R foot s/p hardware removal of the R ankle and application of wound vac on 2/10, wound vac removed 2/27. Left thigh pain, CT negative. Prior admit 11/24 for R ankle ORIF and in 12/24 for revision. S/p excisional debridement of right ankle on 2/12. Pt underwent central line placement on 04/07/2023 however he has been having issues with recurrent bleeding from the insertion site which has finally stopped. 04/17/23 pt with drop in sats requiring 2 L of O2 via [place], chest xray concerning for edema or possibly pneumonia.Significant PMH: HTN, HLD, CAD s/p PCI, COPD, DM2, CKD stage IIIb, PVD, gout, OSA, neuropathy, COPD, a-fib, CAD s/p percutaneous coronary angioplasty, angina, obesity   OT comments  STAR Patient OT/PT session: Patient seen after HD  with patient stating fatigue and required encouragement to participate to simulate home environment after HD. Patient able to get to EOB with CGA with BP 128/59 seated on EOB with abdominal binder. Patient performed mobility in hallway with RW and min assist +2 with BP dropping and recovering with seated rest. OT continues to recommend LTACH due to patient is currently unsafe to return home. Acute OT to continue to follow with STAR program to address established goals to facilitate DC to next venue of care.       If plan is discharge home, recommend the following:  A lot of help with bathing/dressing/bathroom;Assistance with cooking/housework;Help with stairs or ramp for entrance;Assist for transportation;Two people to help with walking and/or transfers   Equipment Recommendations  Other (comment) (bariatric BSC)    Recommendations for Other Services      Precautions /  Restrictions Precautions Precautions: Fall Recall of Precautions/Restrictions: Impaired Precaution/Restrictions Comments: Watch BP Required Braces or Orthoses: Other Brace Other Brace: abdominal binder (Prn) Restrictions Weight Bearing Restrictions Per Provider Order: Yes RLE Weight Bearing Per Provider Order: Weight bearing as tolerated Other Position/Activity Restrictions: per chat with Dr Lajoyce Corners, 3/12 pt no longer needs CAM boot per Dr. Audrie Lia note 04/03/2023 and orders 04/03/2023 pt WBAT       Mobility Bed Mobility Overal bed mobility: Needs Assistance Bed Mobility: Supine to Sit     Supine to sit: Contact guard, HOB elevated, Used rails     General bed mobility comments: contact guard for safety, increased effort, time and  heavy use of rails to pull to seated EoB    Transfers Overall transfer level: Needs assistance Equipment used: Rolling walker (2 wheels) Transfers: Sit to/from Stand, Bed to chair/wheelchair/BSC Sit to Stand: Min assist, From elevated surface, +2 physical assistance     Step pivot transfers: Min assist     General transfer comment: min A for power up from elevated bed surface for ambulation, minAx2 for power up from lower recliner surface,     Balance Overall balance assessment: Needs assistance Sitting-balance support: No upper extremity supported, Feet supported Sitting balance-Leahy Scale: Fair     Standing balance support: Bilateral upper extremity supported, During functional activity, Reliant on assistive device for balance Standing balance-Leahy Scale: Poor Standing balance comment: Pt reliant on RW for dynamic activity                           ADL either performed or assessed with clinical judgement  ADL Overall ADL's : Needs assistance/impaired                                       General ADL Comments: Patient already dressed, focused on bed mobility, transfers, and functional mobility     Extremity/Trunk Assessment              Vision       Perception     Praxis     Communication Communication Communication: Impaired   Cognition Arousal: Alert Behavior During Therapy: WFL for tasks assessed/performed                                 Following commands: Impaired Following commands impaired: Follows one step commands inconsistently, Follows one step commands with increased time      Cueing   Cueing Techniques: Verbal cues, Tactile cues  Exercises      Shoulder Instructions       General Comments Please see PT note for BP measurements    Pertinent Vitals/ Pain       Pain Assessment Pain Assessment: Faces Faces Pain Scale: Hurts a little bit Pain Location: generalized Pain Descriptors / Indicators: Discomfort, Grimacing Pain Intervention(s): Limited activity within patient's tolerance  Home Living                                          Prior Functioning/Environment              Frequency  Min 2X/week        Progress Toward Goals  OT Goals(current goals can now be found in the care plan section)  Progress towards OT goals: Progressing toward goals  Acute Rehab OT Goals Patient Stated Goal: to go home OT Goal Formulation: With patient Time For Goal Achievement: 05/14/23 Potential to Achieve Goals: Good ADL Goals Pt Will Perform Grooming: with set-up;sitting Pt Will Perform Lower Body Dressing: with min assist;with adaptive equipment;with caregiver independent in assisting;sitting/lateral leans;sit to/from stand Pt Will Transfer to Toilet: stand pivot transfer;with min assist;bedside commode Pt Will Perform Toileting - Clothing Manipulation and hygiene: with min assist;sitting/lateral leans;sit to/from stand Pt/caregiver will Perform Home Exercise Program: Increased strength;Both right and left upper extremity;With theraband;With written HEP provided Additional ADL Goal #1: Pt will be Mod I in  and OOB for basic ADLs  Plan      Co-evaluation    PT/OT/SLP Co-Evaluation/Treatment: Yes Reason for Co-Treatment: For patient/therapist safety;To address functional/ADL transfers PT goals addressed during session: Mobility/safety with mobility;Proper use of DME;Balance OT goals addressed during session: ADL's and self-care;Strengthening/ROM      AM-PAC OT "6 Clicks" Daily Activity     Outcome Measure   Help from another person eating meals?: None Help from another person taking care of personal grooming?: A Little Help from another person toileting, which includes using toliet, bedpan, or urinal?: A Lot Help from another person bathing (including washing, rinsing, drying)?: A Lot Help from another person to put on and taking off regular upper body clothing?: A Little Help from another person to put on and taking off regular lower body clothing?: A Lot 6 Click Score: 16    End of Session Equipment Utilized During Treatment: Gait belt;Rolling walker (2 wheels)  OT  Visit Diagnosis: Unsteadiness on feet (R26.81);Other abnormalities of gait and mobility (R26.89);Muscle weakness (generalized) (M62.81) Pain - Right/Left: Left Pain - part of body: Hip   Activity Tolerance Patient tolerated treatment well   Patient Left in chair;with call bell/phone within reach;with chair alarm set   Nurse Communication Mobility status        Time: 1323-1350 OT Time Calculation (min): 27 min  Charges: OT General Charges $OT Visit: 1 Visit OT Treatments $Therapeutic Activity: 8-22 mins  Alfonse Flavors, OTA Acute Rehabilitation Services  Office [phone]   Dewain Penning 05/06/2023, 3:11 PM

## 2023-05-06 NOTE — Progress Notes (Signed)
 [facility] NEPHROLOGY PROGRESS NOTE  Assessment/ Plan: Dialysis on Saturday Pt is a 68 y.o. yo male    # Dialysis dependent AKI on CKD 3; ATN and AIN both possible. -CLIP as AKI to [place] start 3/11 if needed. -  He is nonoliguric - BUN and crt rising more slowly in between tx.   Dialysis on Saturday;  tolerated HD today and continue outpatient schedule ( TTS) but continue to watch for signs of recovery.  # Hypertension with tendency towards orthostasis on low-dose carvedilol and tamsulosin. Ease off on UF as seems euvolemic and is nonoliguric  # Nephrotic range proteinuria with negative serological evaluation but decision against biopsy at the current time per previous notes.  # Right lower extremity infection with involvement of orthopedic hardware status post removal and antibiotics discontinued 2/28 with concern for AIN.  #Mild hyponatremia, continue to limit free water intake.  Corrected with more UF with HD  # Increased anion gap metabolic acidosis, managed with dialysis.  # Anemia-   iron ok  and added ESA   # Dispo-  now looking into LTAC ar at least increasing stamina and strength prior to discharge   Subjective:   no new issues -  s/p HD this AM.   Due to stamina/strength issues-  looking into possible LTAC  -  450 of UOP  Objective Vital signs in last 24 hours: Vitals:   05/06/23 1230 05/06/23 1240 05/06/23 1246 05/06/23 1303  BP: (!) 153/66 (!) 155/68 (!) 148/61 (!) 153/63  Pulse: 61 60 (!) 58 60  Resp: 19 16 16 18   Temp:  (!) 97.3 F (36.3 C)  98.3 F (36.8 C)  TempSrc:    Oral  SpO2: 99% 100% 100% 100%  Weight:   122.4 kg   Height:       Weight change:   Intake/Output Summary (Last 24 hours) at 05/06/2023 1319 Last data filed at 05/06/2023 1240 Gross per 24 hour  Intake --  Output 4100 ml  Net -4100 ml       Labs: RENAL PANEL Recent Labs  Lab 04/30/23 0205 05/01/23 0704 05/03/23 0900 05/05/23 0547 05/06/23 0625  NA 134* 135 135  136 137  K 3.2* 3.7 3.7 4.3 4.2  CL 97* 99 96* 100 99  CO2 27 25 24 27 26   GLUCOSE 167* 151* 109* 267* 169*  BUN 35* 44* 40* 34* 34*  CREATININE 5.36* 6.15* 5.35* 4.68* 4.98*  CALCIUM 8.9 8.5* 8.4* 8.5* 8.9  PHOS 3.0 4.5 4.5 4.3 4.6  ALBUMIN 2.7* 2.5* 2.7* 2.6* 2.7*    Liver Function Tests: Recent Labs  Lab 05/03/23 0900 05/05/23 0547 05/06/23 0625  ALBUMIN 2.7* 2.6* 2.7*   No results for input(s): "LIPASE", "AMYLASE" in the last 168 hours. No results for input(s): "AMMONIA" in the last 168 hours. CBC: Recent Labs    03/28/23 0810 03/29/23 0407 04/27/23 0530 04/28/23 0530 05/01/23 0703 05/03/23 0900 05/06/23 0918  HGB  --    < > 7.7* 7.8* 7.9* 8.3* 8.0*  MCV  --    < > 86.1 86.6 88.1 89.6 91.4  VITAMINB12 305  --   --   --   --   --   --   FERRITIN  --   --   --   --   --  179  --   TIBC  --   --   --   --   --  190*  --   IRON  --   --   --   --   --  43*  --    < > = values in this interval not displayed.    Cardiac Enzymes: No results for input(s): "CKTOTAL", "CKMB", "CKMBINDEX", "TROPONINI" in the last 168 hours.  CBG: Recent Labs  Lab 05/05/23 1158 05/05/23 1726 05/06/23 0000 05/06/23 0609 05/06/23 0743  GLUCAP 157* 227* 241* 162* 147*    Iron Studies:  No results for input(s): "IRON", "TIBC", "TRANSFERRIN", "FERRITIN" in the last 72 hours.  Studies/Results: IR Removal Tun Cv Cath W/O FL Result Date: 05/05/2023 INDICATION: 68 year old male with recent osteomyelitis s/p IV antibiotic therapy via right tunneled central venous catheter placed 04/07/23. Catheter no longer needed. EXAM: REMOVAL TUNNELED CENTRAL VENOUS CATHETER MEDICATIONS: None ANESTHESIA/SEDATION: None FLUOROSCOPY: None COMPLICATIONS: None immediate. PROCEDURE: Informed written consent was obtained from the patient after a thorough discussion of the procedural risks, benefits and alternatives. All questions were addressed. Maximal Sterile Barrier Technique was utilized including caps, mask,  sterile gowns, sterile gloves, sterile drape, hand hygiene and skin antiseptic. A timeout was performed prior to the initiation of the procedure. The patient's right chest and catheter was prepped and draped in a normal sterile fashion. Heparin was removed from both ports of catheter. 1% lidocaine was used for local anesthesia. Using manual traction the cuff of the catheter was exposed and the catheter was removed in it's entirety. Pressure was held till hemostasis was obtained. A sterile dressing was applied. The patient tolerated the procedure well with no immediate complications. IMPRESSION: Successful catheter removal as described above. Performed by: Loyce Dys PA-C Electronically Signed   By: Corlis Leak M.D.   On: 05/05/2023 16:05     Medications: Infusions:    Scheduled Medications:  allopurinol  50 mg Oral Daily   amiodarone  200 mg Oral BID   apixaban  5 mg Oral BID   atorvastatin  40 mg Oral Daily   Chlorhexidine Gluconate Cloth  6 each Topical Q0600   Chlorhexidine Gluconate Cloth  6 each Topical Q0600   clopidogrel  75 mg Oral Q breakfast   darbepoetin (ARANESP) injection - DIALYSIS  200 mcg Subcutaneous Q Fri-1800   docusate sodium  100 mg Oral BID   DULoxetine  60 mg Oral Daily   ezetimibe  10 mg Oral Daily   gabapentin  300 mg Oral QHS   Gerhardt's butt cream   Topical TID   guaiFENesin  600 mg Oral BID   insulin aspart  0-9 Units Subcutaneous Q6H   insulin glargine  28 Units Subcutaneous BID   levothyroxine  88 mcg Oral QAC breakfast   polyethylene glycol  17 g Oral Daily   sodium chloride flush  3 mL Intravenous Q12H   sodium chloride flush  3-10 mL Intravenous Q12H   tamsulosin  0.4 mg Oral Daily    have reviewed scheduled and prn medications.  Physical Exam: General:NAD, comfortable Heart:RRR, s1s2 nl Lungs: Reduced breath sound bilateral basal Abdomen:soft, Non-tender, distended Extremities:no edema  Dialysis Access: LIJ TDC in place  Stephie Xu  W 05/06/2023,1:19 PM  LOS: 40 days

## 2023-05-07 ENCOUNTER — Other Ambulatory Visit (HOSPITAL_COMMUNITY): Payer: Self-pay

## 2023-05-07 ENCOUNTER — Ambulatory Visit (HOSPITAL_COMMUNITY): Payer: Medicare Other | Admitting: Physician Assistant

## 2023-05-07 DIAGNOSIS — L02415 Cutaneous abscess of right lower limb: Secondary | ICD-10-CM | POA: Diagnosis not present

## 2023-05-07 LAB — RENAL FUNCTION PANEL
Albumin: 2.6 g/dL — ABNORMAL LOW (ref 3.5–5.0)
Anion gap: 7 (ref 5–15)
BUN: 22 mg/dL (ref 8–23)
CO2: 30 mmol/L (ref 22–32)
Calcium: 8.6 mg/dL — ABNORMAL LOW (ref 8.9–10.3)
Chloride: 99 mmol/L (ref 98–111)
Creatinine, Ser: 3.71 mg/dL — ABNORMAL HIGH (ref 0.61–1.24)
GFR, Estimated: 17 mL/min — ABNORMAL LOW (ref >60)
Glucose, Bld: 167 mg/dL — ABNORMAL HIGH (ref 70–99)
Phosphorus: 3.5 mg/dL (ref 2.5–4.6)
Potassium: 4.6 mmol/L (ref 3.5–5.1)
Sodium: 136 mmol/L (ref 135–145)

## 2023-05-07 LAB — GLUCOSE, CAPILLARY
Glucose-Capillary: 111 mg/dL — ABNORMAL HIGH (ref 70–99)
Glucose-Capillary: 98 mg/dL (ref 70–99)
Glucose-Capillary: 246 mg/dL — ABNORMAL HIGH (ref 70–99)

## 2023-05-07 MED ORDER — MIDODRINE HCL 5 MG PO TABS
5.0000 mg | ORAL_TABLET | Freq: Three times a day (TID) | ORAL | Status: DC
  Administered 2023-05-08 – 2023-05-10 (×6): 5 mg via ORAL
  Filled 2023-05-07 (×7): qty 1

## 2023-05-07 NOTE — Progress Notes (Signed)
**Harold Harold**  Harold Harold  Assessment/ Plan: Dialysis on Saturday Pt is a 68 y.o. yo male    # Dialysis dependent AKI on CKD 3; ATN and AIN both possible. -CLIP as AKI to [place] start 3/11 if needed. -  He is nonoliguric - BUN and crt rising more slowly in between tx.   Dialysis on Saturday;  tolerated HD on 3/18 and continue to watch for signs of recovery. UOP and will hold HD for now; hopefully the increase in UOP represents the start of renal recovery.  Will determine tomorrow whether he will need further dialysis and timing as well pending daily assessment.  # Hypertension with tendency towards orthostasis on low-dose carvedilol and tamsulosin. Ease off on UF as seems euvolemic and is nonoliguric  # Nephrotic range proteinuria with negative serological evaluation but decision against biopsy at the current time per previous notes.  # Right lower extremity infection with involvement of orthopedic hardware status post removal and antibiotics discontinued 2/28 with concern for AIN.  #Mild hyponatremia, continue to limit free water intake.  Corrected with more UF with HD  # Increased anion gap metabolic acidosis, managed with dialysis.  # Anemia-   iron ok  and added ESA   # Dispo-  now looking into LTAC ar at least increasing stamina and strength prior to discharge   Subjective:   no new issues -  s/p HD this AM.   Due to stamina/strength issues-  looking into possible LTAC  -  1L UOP  Objective Vital signs in last 24 hours: Vitals:   05/06/23 1405 05/06/23 1932 05/06/23 2236 05/07/23 0456  BP: (!) 95/58 (!) 144/66  134/60  Pulse: 63 63  (!) 55  Resp: 18 19 18 19   Temp: 97.6 F (36.4 C) 97.6 F (36.4 C)  97.6 F (36.4 C)  TempSrc: Axillary Axillary  Axillary  SpO2: 99% 98%  97%  Weight:      Height:       Weight change:   Intake/Output Summary (Last 24 hours) at 05/07/2023 1046 Last data filed at 05/06/2023 1450 Gross per 24 hour  Intake 600  ml  Output 3375 ml  Net -2775 ml       Labs: RENAL PANEL Recent Labs  Lab 05/01/23 0704 05/03/23 0900 05/05/23 0547 05/06/23 0625 05/07/23 0407  NA 135 135 136 137 136  K 3.7 3.7 4.3 4.2 4.6  CL 99 96* 100 99 99  CO2 25 24 27 26 30   GLUCOSE 151* 109* 267* 169* 167*  BUN 44* 40* 34* 34* 22  CREATININE 6.15* 5.35* 4.68* 4.98* 3.71*  CALCIUM 8.5* 8.4* 8.5* 8.9 8.6*  PHOS 4.5 4.5 4.3 4.6 3.5  ALBUMIN 2.5* 2.7* 2.6* 2.7* 2.6*    Liver Function Tests: Recent Labs  Lab 05/05/23 0547 05/06/23 0625 05/07/23 0407  ALBUMIN 2.6* 2.7* 2.6*   No results for input(s): "LIPASE", "AMYLASE" in the last 168 hours. No results for input(s): "AMMONIA" in the last 168 hours. CBC: Recent Labs    03/28/23 0810 03/29/23 0407 04/27/23 0530 04/28/23 0530 05/01/23 0703 05/03/23 0900 05/06/23 0918  HGB  --    < > 7.7* 7.8* 7.9* 8.3* 8.0*  MCV  --    < > 86.1 86.6 88.1 89.6 91.4  VITAMINB12 305  --   --   --   --   --   --   FERRITIN  --   --   --   --   --  179  --   TIBC  --   --   --   --   --  190*  --   IRON  --   --   --   --   --  43*  --    < > = values in this interval not displayed.    Cardiac Enzymes: No results for input(s): "CKTOTAL", "CKMB", "CKMBINDEX", "TROPONINI" in the last 168 hours.  CBG: Recent Labs  Lab 05/06/23 0743 05/06/23 1307 05/06/23 1611 05/06/23 2327 05/07/23 0636  GLUCAP 147* 97 192* 197* 111*    Iron Studies:  No results for input(s): "IRON", "TIBC", "TRANSFERRIN", "FERRITIN" in the last 72 hours.  Studies/Results: IR Removal Tun Cv Cath Harold/O FL Result Date: 05/05/2023 INDICATION: 68 year old male with recent osteomyelitis s/p IV antibiotic therapy via right tunneled central venous catheter placed 04/07/23. Catheter no longer needed. EXAM: REMOVAL TUNNELED CENTRAL VENOUS CATHETER MEDICATIONS: None ANESTHESIA/SEDATION: None FLUOROSCOPY: None COMPLICATIONS: None immediate. PROCEDURE: Informed written consent was obtained from the patient after  a thorough discussion of the procedural risks, benefits and alternatives. All questions were addressed. Maximal Sterile Barrier Technique was utilized including caps, mask, sterile gowns, sterile gloves, sterile drape, hand hygiene and skin antiseptic. A timeout was performed prior to the initiation of the procedure. The patient's right chest and catheter was prepped and draped in a normal sterile fashion. Heparin was removed from both ports of catheter. 1% lidocaine was used for local anesthesia. Using manual traction the cuff of the catheter was exposed and the catheter was removed in it's entirety. Pressure was held till hemostasis was obtained. A sterile dressing was applied. The patient tolerated the procedure well with no immediate complications. IMPRESSION: Successful catheter removal as described above. Performed by: Loyce Dys PA-C Electronically Signed   By: Corlis Leak M.D.   On: 05/05/2023 16:05     Medications: Infusions:    Scheduled Medications:  allopurinol  50 mg Oral Daily   amiodarone  200 mg Oral BID   apixaban  5 mg Oral BID   atorvastatin  40 mg Oral Daily   Chlorhexidine Gluconate Cloth  6 each Topical Q0600   clopidogrel  75 mg Oral Q breakfast   darbepoetin (ARANESP) injection - DIALYSIS  200 mcg Subcutaneous Q Fri-1800   docusate sodium  100 mg Oral BID   DULoxetine  60 mg Oral Daily   ezetimibe  10 mg Oral Daily   gabapentin  300 mg Oral QHS   Gerhardt's butt cream   Topical TID   guaiFENesin  600 mg Oral BID   insulin aspart  0-9 Units Subcutaneous Q6H   insulin glargine  28 Units Subcutaneous BID   levothyroxine  88 mcg Oral QAC breakfast   polyethylene glycol  17 g Oral Daily   sodium chloride flush  3 mL Intravenous Q12H   sodium chloride flush  3-10 mL Intravenous Q12H   tamsulosin  0.4 mg Oral Daily    have reviewed scheduled and prn medications.  Physical Exam: General:NAD, comfortable Heart:RRR, s1s2 nl Lungs: Reduced breath sound bilateral  basal Abdomen:soft, Non-tender, distended Extremities:no edema  Dialysis Access: LIJ TDC in place  Harold Harold 05/07/2023,10:46 AM  LOS: 41 days

## 2023-05-07 NOTE — Progress Notes (Signed)
 Physical Therapy Treatment Patient Details Name: Harold [name] MRN: [id number] DOB: 1955-12-06 Today's Date: 05/07/2023   History of Present Illness 68 y.o. male presents to [facility] 03/27/23 with productive cough and generalized weakness. Admitted with influenza A and cellulitis of R foot s/p hardware removal of the R ankle and application of wound vac on 2/10, wound vac removed 2/27. Left thigh pain, CT negative. Prior admit 11/24 for R ankle ORIF and in 12/24 for revision. S/p excisional debridement of right ankle on 2/12. Pt underwent central line placement on 04/07/2023 however he has been having issues with recurrent bleeding from the insertion site which has finally stopped. 04/17/23 pt with drop in sats requiring 2 L of O2 via [place], chest xray concerning for edema or possibly pneumonia.Significant PMH: HTN, HLD, CAD s/p PCI, COPD, DM2, CKD stage IIIb, PVD, gout, OSA, neuropathy, COPD, a-fib, CAD s/p percutaneous coronary angioplasty, angina, obesity    PT Comments  STAR PT/OT Session: PT/OT pleasantly surprised to see pt up in recliner and reported to be up in recliner for about 3 hours in attempt to improve orthostatic hypotension. Unfortunately, despite increased upright and use of abdominal binder pt has significant drop in BP (see OT notes for values), and increased dizziness, and UE tremors with standing. Pt is min A and attempts 2 bouts of standing with similar symptoms. Pt requests weights to work with while he is sitting up and was provided 4 lb dowel to work on UE strength. Pt's symptomatic orthostatic hypotension places him at high risk for fall and jeopardizes his husband's safety as well. PT continues to recommend continued PT at [facility] as only safe discharge plan especially while he needs continued HD.     If plan is discharge home, recommend the following: A lot of help with walking and/or transfers;A lot of help with bathing/dressing/bathroom;Assistance with cooking/housework;Assist for  transportation;Help with stairs or ramp for entrance   Can travel by private vehicle     Yes  Equipment Recommendations  None recommended by PT       Precautions / Restrictions Precautions Precautions: Fall Recall of Precautions/Restrictions: Impaired Precaution/Restrictions Comments: Watch BP Required Braces or Orthoses: Other Brace Other Brace: abdominal binder (Prn) Restrictions Weight Bearing Restrictions Per Provider Order: Yes RLE Weight Bearing Per Provider Order: Weight bearing as tolerated Other Position/Activity Restrictions: per chat with Dr Lajoyce Corners, 3/12 pt no longer needs CAM boot per Dr. Audrie Lia note 04/03/2023 and orders 04/03/2023 pt WBAT     Mobility  Bed Mobility Overal bed mobility: Needs Assistance             General bed mobility comments: OOB in recliner    Transfers Overall transfer level: Needs assistance Equipment used: Rolling walker (2 wheels) Transfers: Sit to/from Stand Sit to Stand: Min assist           General transfer comment: patient able to correct self when attempting to stand with correcting hand placement           Balance Overall balance assessment: Needs assistance Sitting-balance support: No upper extremity supported, Feet supported Sitting balance-Leahy Scale: Fair     Standing balance support: Bilateral upper extremity supported, During functional activity, Reliant on assistive device for balance Standing balance-Leahy Scale: Poor Standing balance comment: reliant on UE support                            Communication Communication Communication: Impaired  Cognition Arousal: Alert Behavior During Therapy:  WFL for tasks assessed/performed                             Following commands: Impaired Following commands impaired: Follows one step commands inconsistently, Follows one step commands with increased time    Cueing Cueing Techniques: Verbal cues, Tactile cues     General Comments  General comments (skin integrity, edema, etc.): 4 pound dowel provided for patient to perform UE strengthening      Pertinent Vitals/Pain Pain Assessment Pain Assessment: Faces Faces Pain Scale: Hurts a little bit Pain Location: generalized Pain Descriptors / Indicators: Discomfort, Grimacing Pain Intervention(s): Limited activity within patient's tolerance, Monitored during session, Repositioned     PT Goals (current goals can now be found in the care plan section) Acute Rehab PT Goals PT Goal Formulation: With patient Time For Goal Achievement: 05/08/23 Potential to Achieve Goals: Fair Progress towards PT goals: Progressing toward goals    Frequency    Min 1X/week           Co-evaluation PT/OT/SLP Co-Evaluation/Treatment: Yes Reason for Co-Treatment: For patient/therapist safety;To address functional/ADL transfers PT goals addressed during session: Mobility/safety with mobility;Proper use of DME;Balance OT goals addressed during session: ADL's and self-care;Strengthening/ROM      AM-PAC PT "6 Clicks" Mobility   Outcome Measure  Help needed turning from your back to your side while in a flat bed without using bedrails?: A Little Help needed moving from lying on your back to sitting on the side of a flat bed without using bedrails?: A Little Help needed moving to and from a bed to a chair (including a wheelchair)?: A Little Help needed standing up from a chair using your arms (e.g., wheelchair or bedside chair)?: A Lot Help needed to walk in hospital room?: Total Help needed climbing 3-5 steps with a railing? : Total 6 Click Score: 13    End of Session Equipment Utilized During Treatment: Gait belt Activity Tolerance: Patient limited by fatigue;Treatment limited secondary to medical complications (Comment) (orthostatic hypotension) Patient left: in chair;with call bell/phone within reach;with chair alarm set Nurse Communication: Mobility status PT Visit Diagnosis:  Other abnormalities of gait and mobility (R26.89);Unsteadiness on feet (R26.81);Muscle weakness (generalized) (M62.81);History of falling (Z91.81);Difficulty in walking, not elsewhere classified (R26.2)     Time: 3664-4034 PT Time Calculation (min) (ACUTE ONLY): 34 min  Charges:    $Therapeutic Activity: 8-22 mins PT General Charges $$ ACUTE PT VISIT: 1 Visit                     Rod Majerus B. Beverely Risen PT, DPT Acute Rehabilitation Services Please use secure chat or  Call Office [phone]    Elon Alas [facility] 05/07/2023, 4:08 PM

## 2023-05-07 NOTE — Consult Note (Signed)
 Consultation Note Date: 05/07/2023   Patient Name: Harold [name]  DOB: [birth year]  MRN: [id number]  Age / Sex: 67 y.o., male  PCP: [name], MD Referring Physician: Hughie Closs, MD  Reason for Consultation: Goals of care  HPI/Patient Profile: 68 y.o. male  with past medical history of OSA, DM2, hypertension, HLD, CAD, A-fib, chronic kidney disease, anxiety depression, GERD, hypothyroidism, COPD, pulmonary hypertension, hospitalization in November with ankle fracture, admitted on 03/27/2023 with influenza A, was also found to have infected wound on his right lower extremity and underwent surgery for removal of hardware.  Admission has been complicated by acute on chronic renal failure and he was started on intermittent dialysis-nephrology is hopeful for renal recovery.  Palliative medicine consulted for goals of care.  Primary Decision Maker PATIENT  Discussion: Chart reviewed including labs, progress notes, imaging from this and previous encounters.  I met with the patient at the bedside.  Prior to admission he was living at home with his spouse. He enjoys working on Administrator, [employer]. His goal of care is for medical stabilization and eventual discharge back home. Advanced Care Planning He does have advanced directives completed however these are not on his chart.  I asked him to bring a copy. His surrogate decision maker would be his spouse. If he were to go into cardiac arrest he does request CPR and attempts at resuscitation.  He would not want to be kept alive long-term on artificial life support in the event that he was not going to be able to recover. We discussed that if his renal function does not recover that hemodialysis will be a form of life support for him.  It will be important for him to discuss with his spouse and his medical providers what quality of life is important to him and what his  goals of being on dialysis would be.    SUMMARY OF RECOMMENDATIONS -Continue fullscope full code -Patient is hopeful for admission to long-term acute care-he has been deemed ineligible for CIR due to not being able to tolerate the intensity of it   Code Status/Advance Care Planning:   Code Status: Full Code    Prognosis:   Unable to determine  Discharge Planning: To Be Determined  Primary Diagnoses: Present on Admission:  Influenza A  Cellulitis of right lower extremity  Paroxysmal atrial fibrillation (HCC)  Essential hypertension  Acquired hypothyroidism  CKD (chronic kidney disease) stage 4, GFR 15-29 ml/min (HCC)  Acute kidney injury superimposed on stage 4 chronic kidney disease (HCC)   Review of Systems  Constitutional:  Positive for activity change and fatigue.    Physical Exam Vitals and nursing note reviewed.  Constitutional:      General: He is not in acute distress.    Appearance: He is not ill-appearing.  Cardiovascular:     Rate and Rhythm: Normal rate.  Pulmonary:     Effort: Pulmonary effort is normal.  Neurological:     Mental Status: He is alert and oriented to person, place, and time.  Vital Signs: BP 123/68 (BP Location: Right Arm)   Pulse (!) 58   Temp 97.8 F (36.6 C) (Oral)   Resp 20   Ht 6\' 1"  (1.854 m)   Wt 122.4 kg   SpO2 99%   BMI 35.60 kg/m  Pain Scale: 0-10 POSS *See Group Information*: S-Acceptable,Sleep, easy to arouse Pain Score: 0-No pain   SpO2: SpO2: 99 % O2 Device:SpO2: 99 % O2 Flow Rate: .O2 Flow Rate (L/min): 2 L/min  IO: Intake/output summary:  Intake/Output Summary (Last 24 hours) at 05/07/2023 1213 Last data filed at 05/07/2023 1147 Gross per 24 hour  Intake 600 ml  Output 3650 ml  Net -3050 ml    LBM: Last BM Date : 05/03/23 Baseline Weight: Weight: (!) 138.1 kg Most recent weight: Weight: 122.4 kg       Thank you for this consult. Palliative medicine will continue to follow and assist as needed.   Time Total: 85 mins Signed by: Ocie Bob, AGNP-C Palliative Medicine  Time includes:   Preparing to see the patient (e.g., review of tests) Obtaining and/or reviewing separately obtained history Performing a medically necessary appropriate examination and/or evaluation Counseling and educating the patient/family/caregiver Ordering medications, tests, or procedures Referring and communicating with other health care professionals (when not reported separately) Documenting clinical information in the electronic or other health record Independently interpreting results (not reported separately) and communicating results to the patient/family/caregiver Care coordination (not reported separately) Clinical documentation   Please contact Palliative Medicine Team phone at [phone] for questions and concerns.  For individual provider: See Loretha Stapler

## 2023-05-07 NOTE — Progress Notes (Signed)
 PROGRESS NOTE    Harold [name]  ZHY:[id number] DOB: 11/28/1955 DOA: 03/27/2023 PCP: Harold Northern, MD   Brief Narrative:  Patient is a 68 year old male with morbid obesity, OSA, DM2, HTN, HLD, CAD/stent, A-fib on Eliquis, carotid artery stenosis, CKD, chronic anemia, anxiety/depression, diabetic neuropathy, GERD, hypothyroidism, COPD, pulmonary hypertension.  Patient presented with fever, cough, myalgia and generalized weakness and was found to be positive for influenza A.  Chest x-ray was negative for infiltrates.  Workup done during the hospital stay revealed right lower extremity wound infection/osteomyelitis of right ankle status post removal of hardware.  Cultures grew Enterobacter cloacae and MRSE.  Right lower extremity wound is healing well.  Antibiotics were managed by ID.  Patient monitored off antibiotics for a week, ID eventually signed off on 04/29/2023.  Apparently, patient developed worsening renal function on antibiotics.  Patient is on intermittent dialysis for severe and dense ATN.  Nephrology input is appreciated.   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 -Chest x-ray did not reveal any infiltrates.   -Improved with symptomatic treatment.  Patient has been on room air for very long time.   Right leg wound infection/osteomyelitis  right ankle s/p hardware removal Enterobacter Cloacea, MRSE  -Prior right ankle surgery (12/2022) complicated by postsurgical wound infection. -S/p right ankle hardware removal, I&D and wound VAC placement on 2/10 by Dr. Jena Gauss -S/p repeat debridement of the right ankle by Dr. Lajoyce Corners 04/02/2023.   -ID was consulted, initially on cefepime, subsequently on daptomycin and ertapenem, eventually antibiotics held due to worsening renal function.  Patient doing well without further signs of infection so ID eventually signed off on 04/29/2023.  No plan of antibiotics.   AKI on CKD stage IIIb / Nephrotic range proteinuria, uremia, Anion gap metabolic acidosis: -Baseline creatinine appears to be 1.9-2.2.   -Renal function has continued to progressively worsen -Nephrology following, renal biopsy deferred due to high risk of bleeding.  Patient was also on Plavix for recent stent for PAD  -TDC placed on 3/4, underwent first HD session and now is receiving intermittent dialysis.  Appreciate nephrology help.  Plan for him to start outpatient dialysis at [facility] dialysis on Tuesday.   Right facial droop -On 3/1 patient noted to have right facial droop, evaluated by neurology, MRI of the brain negative for acute stroke. -Currently on Plavix.   PAD -Seen by vascular surgery underwent aortogram, right leg angiogram, successful stenting of right superficial femoral artery on 04/01/2023.   -Continue Plavix, Zetia   Acute metabolic encephalopathy -Likely multifactorial, uremia, infection, drug-induced from possibility of cefepime induced encephalopathy, chronic worsening memory issues -Started on hemodialysis Continue Cymbalta, decreased Neurontin 04/26/2023: Encephalopathy resolved.   Type 2 diabetes mellitus / Diabetic neuropathy, POA: A1c 7 on 01/02/2023. PTA meds-Tresiba 60 units daily, Mounjaro.  Patient currently on [facility]  28 units twice daily and SSI, blood sugar better controlled.   Diabetic  neuropathy -Gabapentin decreased due to worsening renal function. (PTA reportedly on gabapentin 1600 mg 3 times daily).  Currently on Cymbalta.   Hypokalemia/hypomagnesemia: resolved.   Paroxysmal A-fib with slow ventricular response: -HR noted to be in low 40s, cardiology consulted, Coreg held, and they recommended continuing amiodarone.  Heart rate has improved but still in bradycardia range in 50s.   -CHADS-VASc of 4.     Essential hypertension Coreg discontinued by cardiology blood pressure started rising, started on amlodipine 5 mg but then patient had orthostatic hypotension so amlodipine was discontinued.  This has been an issue in the past as well.  We will allow slightly higher blood pressure.  Check orthostatics every shift.    Anemia of chronic disease: Baseline hemoglobin between 10.5 11.5.   -H&H stable, borderline   CAD/stent Carotid artery stenosis HLD Continue PTA meds -Plavix, Crestor and Eliquis.   Hypothyroidism Continue Synthroid   Obstructive sleep apnea CPAP at bedtime    Gout Continue allopurinol, colchicine   Orthostatic hypotension -Received brief IV fluids, continue abdominal binder -2D echo showed EF 55 to 60%, basal inferior hypokinesis, G2 DD   Generalized debility -PT had recommended SNF however patient will have to pay out-of-pocket which he is not able to afford.   Dizziness/vertigo/double vision: Intermittent complaints of dizziness.  MRI brain completed on 04/19/2023 was unremarkable.  Low blood pressure is considered to be the cause.  Bradycardia was also suspected as one of the cause, Coreg has been discontinued and his heart rate is improving.  Symptoms have now resolved.   Morbid obesity: Estimated body mass index is 40.14 kg/m as calculated from the following:   Height as of this encounter: 6\' 1"  (1.854 m).   Weight as of this encounter: 138 kg. Weight loss and diet modification counseled.  Disposition: Patient was medically ready for  discharge 04/30/2023 however patient's spouse [name], who is the only other member living in the home was unavailable.  Per TOC, [name] will return in town on Friday.  However PT said "We need to practice wheelchair transfers with spouse which will likely not happen until late Friday. To set him up for success, it would be better if he could stay for dialysis on Saturday and discharge home from dialysis on Saturday" and based on their recommendations, we  planned for him to be discharged on Saturday/05/03/2023 after dialysis. However when PT/OT worked with him again on 05/02/2023, they were trying to simulate a normal day, getting dressed, getting into the wheelchair and walking in the hallway. After walking 25 feet pt rested in wheelchair for 10 minutes after attempting to stand x2 BP dropped to 82/40, pt had tremors in bilateral UE and was slurring his speech. This is with abdominal binder in place and TED hose on L leg.  PT did not think that patient can safely be discharged home.  We are pursuing LTAC at the moment.  P2P was requested by [employer].  I spoke to the medical director Dr. Joseph Art, who denied approval.  PT/OT, TOC and LTAC team was informed.  PT worked with him again and once again he became hypotensive, Given pt's continued drop in BP with positional change, PT continues to recommend continued therapy at [facility]. Discharge home pt jeopardizes not only his safety but that of his significant other.  Appeal in process.  DVT prophylaxis: Place TED hose Start: 05/01/23 1621 Place and maintain sequential compression  device Start: 04/15/23 1549 SCDs Start: 04/02/23 1617   Code Status: Full Code  Family Communication:  None present at bedside.  Plan of care discussed with patient in length and he/she verbalized understanding and agreed with it.  Status is: Inpatient Remains inpatient appropriate because: Please see above under disposition   Estimated body mass index is 35.6 kg/m as  calculated from the following:   Height as of this encounter: 6\' 1"  (1.854 m).   Weight as of this encounter: 122.4 kg.  Pressure Injury 04/17/23 Ankle Right;Anterior Deep Tissue Pressure Injury - Purple or maroon localized area of discolored intact skin or blood-filled blister due to damage of underlying soft tissue from pressure and/or shear. (Active)  04/17/23 2040  Location: Ankle  Location Orientation: Right;Anterior  Staging: Deep Tissue Pressure Injury - Purple or maroon localized area of discolored intact skin or blood-filled blister due to damage of underlying soft tissue from pressure and/or shear.  Wound Description (Comments):   Present on Admission: Yes  Dressing Type Gauze (Comment);Silver hydrofiber;Silicone dressing 05/06/23 0800   Nutritional Assessment: Body mass index is 35.6 kg/m.Marland Kitchen Seen by dietician.  I agree with the assessment and plan as outlined below: Nutrition Status:        . Skin Assessment: I have examined the patient's skin and I agree with the wound assessment as performed by the wound care RN as outlined below: Pressure Injury 04/17/23 Ankle Right;Anterior Deep Tissue Pressure Injury - Purple or maroon localized area of discolored intact skin or blood-filled blister due to damage of underlying soft tissue from pressure and/or shear. (Active)  04/17/23 2040  Location: Ankle  Location Orientation: Right;Anterior  Staging: Deep Tissue Pressure Injury - Purple or maroon localized area of discolored intact skin or blood-filled blister due to damage of underlying soft tissue from pressure and/or shear.  Wound Description (Comments):   Present on Admission: Yes  Dressing Type Gauze (Comment);Silver hydrofiber;Silicone dressing 05/06/23 0800    Consultants:  Cardiology  Procedures:  As above   Objective: Vitals:   05/06/23 1405 05/06/23 1932 05/06/23 2236 05/07/23 0456  BP: (!) 95/58 (!) 144/66  134/60  Pulse: 63 63  (!) 55  Resp: 18 19 18 19    Temp: 97.6 F (36.4 C) 97.6 F (36.4 C)  97.6 F (36.4 C)  TempSrc: Axillary Axillary  Axillary  SpO2: 99% 98%  97%  Weight:      Height:        Intake/Output Summary (Last 24 hours) at 05/07/2023 0747 Last data filed at 05/06/2023 1450 Gross per 24 hour  Intake 600 ml  Output 4025 ml  Net -3425 ml   Filed Weights   05/05/23 0537 05/06/23 0915 05/06/23 1246  Weight: 123.5 kg 122.4 kg 122.4 kg   Subjective: Patient seen and examined.  He has no complaints.  He is aware of the denial for LTAC from the insurance company. He is grateful for trying to help him.  Examination:  General exam: Appears calm and comfortable  Respiratory system: Clear to auscultation. Respiratory effort normal. Cardiovascular system: S1 & S2 heard, RRR. No JVD, murmurs, rubs, gallops or clicks. No pedal edema. Gastrointestinal system: Abdomen is nondistended, soft and nontender. No organomegaly or masses felt. Normal bowel sounds heard. Central nervous system: Alert and oriented. No focal neurological deficits. Extremities: Symmetric 5 x 5 power. Skin: No rashes, lesions or ulcers.  Psychiatry: Judgement and insight appear normal. Mood & affect appropriate.   Data Reviewed: I have personally reviewed following labs  and imaging studies  CBC: Recent Labs  Lab 05/01/23 0703 05/03/23 0900 05/06/23 0918  WBC 6.8 8.5 6.5  HGB 7.9* 8.3* 8.0*  HCT 23.7* 25.1* 24.3*  MCV 88.1 89.6 91.4  PLT 187 195 172   Basic Metabolic Panel: Recent Labs  Lab 05/01/23 0704 05/03/23 0900 05/05/23 0547 05/06/23 0625 05/07/23 0407  NA 135 135 136 137 136  K 3.7 3.7 4.3 4.2 4.6  CL 99 96* 100 99 99  CO2 25 24 27 26 30   GLUCOSE 151* 109* 267* 169* 167*  BUN 44* 40* 34* 34* 22  CREATININE 6.15* 5.35* 4.68* 4.98* 3.71*  CALCIUM 8.5* 8.4* 8.5* 8.9 8.6*  PHOS 4.5 4.5 4.3 4.6 3.5   GFR: Estimated Creatinine Clearance: 25.8 mL/min (A) (by C-G formula based on SCr of 3.71 mg/dL (H)). Liver Function Tests: Recent  Labs  Lab 05/01/23 0704 05/03/23 0900 05/05/23 0547 05/06/23 0625 05/07/23 0407  ALBUMIN 2.5* 2.7* 2.6* 2.7* 2.6*   No results for input(s): "LIPASE", "AMYLASE" in the last 168 hours. No results for input(s): "AMMONIA" in the last 168 hours. Coagulation Profile: No results for input(s): "INR", "PROTIME" in the last 168 hours. Cardiac Enzymes: No results for input(s): "CKTOTAL", "CKMB", "CKMBINDEX", "TROPONINI" in the last 168 hours.  BNP (last 3 results) No results for input(s): "PROBNP" in the last 8760 hours. HbA1C: No results for input(s): "HGBA1C" in the last 72 hours. CBG: Recent Labs  Lab 05/06/23 0743 05/06/23 1307 05/06/23 1611 05/06/23 2327 05/07/23 0636  GLUCAP 147* 97 192* 197* 111*   Lipid Profile: No results for input(s): "CHOL", "HDL", "LDLCALC", "TRIG", "CHOLHDL", "LDLDIRECT" in the last 72 hours. Thyroid Function Tests: No results for input(s): "TSH", "T4TOTAL", "FREET4", "T3FREE", "THYROIDAB" in the last 72 hours. Anemia Panel: No results for input(s): "VITAMINB12", "FOLATE", "FERRITIN", "TIBC", "IRON", "RETICCTPCT" in the last 72 hours.  Sepsis Labs: No results for input(s): "PROCALCITON", "LATICACIDVEN" in the last 168 hours.  No results found for this or any previous visit (from the past 240 hours).   Radiology Studies: IR Removal Tun Cv Cath W/O FL Result Date: 05/05/2023 INDICATION: 68 year old male with recent osteomyelitis s/p IV antibiotic therapy via right tunneled central venous catheter placed 04/07/23. Catheter no longer needed. EXAM: REMOVAL TUNNELED CENTRAL VENOUS CATHETER MEDICATIONS: None ANESTHESIA/SEDATION: None FLUOROSCOPY: None COMPLICATIONS: None immediate. PROCEDURE: Informed written consent was obtained from the patient after a thorough discussion of the procedural risks, benefits and alternatives. All questions were addressed. Maximal Sterile Barrier Technique was utilized including caps, mask, sterile gowns, sterile gloves, sterile  drape, hand hygiene and skin antiseptic. A timeout was performed prior to the initiation of the procedure. The patient's right chest and catheter was prepped and draped in a normal sterile fashion. Heparin was removed from both ports of catheter. 1% lidocaine was used for local anesthesia. Using manual traction the cuff of the catheter was exposed and the catheter was removed in it's entirety. Pressure was held till hemostasis was obtained. A sterile dressing was applied. The patient tolerated the procedure well with no immediate complications. IMPRESSION: Successful catheter removal as described above. Performed by: Loyce Dys PA-C Electronically Signed   By: Corlis Leak M.D.   On: 05/05/2023 16:05     Scheduled Meds:  allopurinol  50 mg Oral Daily   amiodarone  200 mg Oral BID   apixaban  5 mg Oral BID   atorvastatin  40 mg Oral Daily   Chlorhexidine Gluconate Cloth  6 each Topical Q0600  clopidogrel  75 mg Oral Q breakfast   darbepoetin (ARANESP) injection - DIALYSIS  200 mcg Subcutaneous Q Fri-1800   docusate sodium  100 mg Oral BID   DULoxetine  60 mg Oral Daily   ezetimibe  10 mg Oral Daily   gabapentin  300 mg Oral QHS   Gerhardt's butt cream   Topical TID   guaiFENesin  600 mg Oral BID   insulin aspart  0-9 Units Subcutaneous Q6H   insulin glargine  28 Units Subcutaneous BID   levothyroxine  88 mcg Oral QAC breakfast   polyethylene glycol  17 g Oral Daily   sodium chloride flush  3 mL Intravenous Q12H   sodium chloride flush  3-10 mL Intravenous Q12H   tamsulosin  0.4 mg Oral Daily   Continuous Infusions:   LOS: 41 days   Hughie Closs, MD [facility]  05/07/2023, 7:47 AM   *Please note that this is a verbal dictation therefore any spelling or grammatical errors are due to the "Dragon Medical One" system interpretation.  Please page via Amion and do not message via secure chat for urgent patient care matters. Secure chat can be used for non urgent patient care  matters.  How to contact the [facility] Attending or Consulting provider 7A - 7P or covering provider during after hours 7P -7A, for this patient?  Check the care team in [facility] and look for a) attending/consulting TRH provider listed and b) the [facility] team listed. Page or secure chat 7A-7P. Log into [email or web address] and use [place]'s universal password to access. If you do not have the password, please contact the hospital operator. Locate the [facility] provider you are looking for under [facility] and page to a number that you can be directly reached. If you still have difficulty reaching the provider, please page the [facility] (Director on Call) for the Hospitalists listed on amion for assistance.

## 2023-05-07 NOTE — Progress Notes (Signed)
 Occupational Therapy Treatment Patient Details Name: Harold [name] MRN: [id number] DOB: 1955-03-30 Today's Date: 05/07/2023   History of present illness 68 y.o. male presents to [facility] 03/27/23 with productive cough and generalized weakness. Admitted with influenza A and cellulitis of R foot s/p hardware removal of the R ankle and application of wound vac on 2/10, wound vac removed 2/27. Left thigh pain, CT negative. Prior admit 11/24 for R ankle ORIF and in 12/24 for revision. S/p excisional debridement of right ankle on 2/12. Pt underwent central line placement on 04/07/2023 however he has been having issues with recurrent bleeding from the insertion site which has finally stopped. 04/17/23 pt with drop in sats requiring 2 L of O2 via [place], chest xray concerning for edema or possibly pneumonia.Significant PMH: HTN, HLD, CAD s/p PCI, COPD, DM2, CKD stage IIIb, PVD, gout, OSA, neuropathy, COPD, a-fib, CAD s/p percutaneous coronary angioplasty, angina, obesity   OT comments  STAR Patient OT/PT session: Patient seated up in recliner and states he has been up approximately 3 hours. BP seated in recliner without abdominal binder 110/53(69), seated in recliner with abdominal binder 119/58(77). Patient able to stand from recliner to RW with min assist and began to feel dizzy with BP 72/59(64). Patient returned to recliner and after short rest recovered to 99/60(70). Patient stood again from recliner with BP 94/54(64). Patient was provided with 4 pound dowel for UE strengthening and instructed in exercises. BP seated in recliner at end of session 123/55(74). Acute OT to continue to follow to address established goals to facilitate DC to next venue of care.        If plan is discharge home, recommend the following:  A lot of help with bathing/dressing/bathroom;Assistance with cooking/housework;Help with stairs or ramp for entrance;Assist for transportation;Two people to help with walking and/or transfers    Equipment Recommendations  Other (comment) (bariatric BSC)    Recommendations for Other Services      Precautions / Restrictions Precautions Precautions: Fall Recall of Precautions/Restrictions: Impaired Precaution/Restrictions Comments: Watch BP Required Braces or Orthoses: Other Brace Other Brace: abdominal binder (Prn) Restrictions Weight Bearing Restrictions Per Provider Order: Yes RLE Weight Bearing Per Provider Order: Weight bearing as tolerated Other Position/Activity Restrictions: per chat with Dr Lajoyce Corners, 3/12 pt no longer needs CAM boot per Dr. Audrie Lia note 04/03/2023 and orders 04/03/2023 pt WBAT       Mobility Bed Mobility Overal bed mobility: Needs Assistance             General bed mobility comments: OOB in recliner    Transfers Overall transfer level: Needs assistance Equipment used: Rolling walker (2 wheels) Transfers: Sit to/from Stand Sit to Stand: Min assist           General transfer comment: patient able to correct self when attempting to stand with correcting hand placement     Balance Overall balance assessment: Needs assistance Sitting-balance support: No upper extremity supported, Feet supported Sitting balance-Leahy Scale: Fair     Standing balance support: Bilateral upper extremity supported, During functional activity, Reliant on assistive device for balance Standing balance-Leahy Scale: Poor Standing balance comment: reliant on UE support                           ADL either performed or assessed with clinical judgement   ADL Overall ADL's : Needs assistance/impaired  Lower Body Dressing: Maximal assistance;Sitting/lateral leans;Sit to/from stand Lower Body Dressing Details (indicate cue type and reason): Patient required max assist to pull up underwear and donning shoes                    Extremity/Trunk Assessment              Vision       Perception     Praxis      Communication Communication Communication: Impaired   Cognition Arousal: Alert Behavior During Therapy: WFL for tasks assessed/performed                                 Following commands: Impaired Following commands impaired: Follows one step commands inconsistently, Follows one step commands with increased time      Cueing   Cueing Techniques: Verbal cues, Tactile cues  Exercises      Shoulder Instructions       General Comments 4 pound dowel provided for patient to perform UE strengthening    Pertinent Vitals/ Pain       Pain Assessment Pain Assessment: Faces Faces Pain Scale: Hurts a little bit Pain Location: generalized Pain Descriptors / Indicators: Discomfort, Grimacing Pain Intervention(s): Limited activity within patient's tolerance, Repositioned  Home Living                                          Prior Functioning/Environment              Frequency  Min 2X/week        Progress Toward Goals  OT Goals(current goals can now be found in the care plan section)  Progress towards OT goals: Progressing toward goals  Acute Rehab OT Goals Patient Stated Goal: to go home OT Goal Formulation: With patient Time For Goal Achievement: 05/14/23 Potential to Achieve Goals: Good ADL Goals Pt Will Perform Grooming: with set-up;sitting Pt Will Perform Lower Body Dressing: with min assist;with adaptive equipment;with caregiver independent in assisting;sitting/lateral leans;sit to/from stand Pt Will Transfer to Toilet: stand pivot transfer;with min assist;bedside commode Pt Will Perform Toileting - Clothing Manipulation and hygiene: with min assist;sitting/lateral leans;sit to/from stand Pt/caregiver will Perform Home Exercise Program: Increased strength;Both right and left upper extremity;With theraband;With written HEP provided Additional ADL Goal #1: Pt will be Mod I in and OOB for basic ADLs  Plan      Co-evaluation     PT/OT/SLP Co-Evaluation/Treatment: Yes Reason for Co-Treatment: For patient/therapist safety;To address functional/ADL transfers PT goals addressed during session: Mobility/safety with mobility;Proper use of DME;Balance OT goals addressed during session: ADL's and self-care;Strengthening/ROM      AM-PAC OT "6 Clicks" Daily Activity     Outcome Measure   Help from another person eating meals?: None Help from another person taking care of personal grooming?: A Little Help from another person toileting, which includes using toliet, bedpan, or urinal?: A Lot Help from another person bathing (including washing, rinsing, drying)?: A Lot Help from another person to put on and taking off regular upper body clothing?: A Little Help from another person to put on and taking off regular lower body clothing?: A Lot 6 Click Score: 16    End of Session Equipment Utilized During Treatment: Gait belt;Rolling walker (2 wheels)  OT Visit Diagnosis: Unsteadiness on feet (R26.81);Other abnormalities of gait and  mobility (R26.89);Muscle weakness (generalized) (M62.81)   Activity Tolerance Patient tolerated treatment well;Other (comment) (soft BP`)   Patient Left in chair;with call bell/phone within reach;with chair alarm set   Nurse Communication Mobility status        Time: 7846-9629 OT Time Calculation (min): 34 min  Charges: OT General Charges $OT Visit: 1 Visit OT Treatments $Self Care/Home Management : 8-22 mins  Alfonse Flavors, OTA Acute Rehabilitation Services  Office [phone]   Dewain Penning 05/07/2023, 3:39 PM

## 2023-05-08 DIAGNOSIS — L02415 Cutaneous abscess of right lower limb: Secondary | ICD-10-CM | POA: Diagnosis not present

## 2023-05-08 LAB — GLUCOSE, CAPILLARY
Glucose-Capillary: 173 mg/dL — ABNORMAL HIGH (ref 70–99)
Glucose-Capillary: 163 mg/dL — ABNORMAL HIGH (ref 70–99)
Glucose-Capillary: 155 mg/dL — ABNORMAL HIGH (ref 70–99)
Glucose-Capillary: 160 mg/dL — ABNORMAL HIGH (ref 70–99)
Glucose-Capillary: 226 mg/dL — ABNORMAL HIGH (ref 70–99)
Glucose-Capillary: 175 mg/dL — ABNORMAL HIGH (ref 70–99)

## 2023-05-08 LAB — RENAL FUNCTION PANEL
Albumin: 2.6 g/dL — ABNORMAL LOW (ref 3.5–5.0)
Anion gap: 8 (ref 5–15)
BUN: 28 mg/dL — ABNORMAL HIGH (ref 8–23)
CO2: 26 mmol/L (ref 22–32)
Calcium: 8.2 mg/dL — ABNORMAL LOW (ref 8.9–10.3)
Chloride: 101 mmol/L (ref 98–111)
Creatinine, Ser: 4.08 mg/dL — ABNORMAL HIGH (ref 0.61–1.24)
GFR, Estimated: 15 mL/min — ABNORMAL LOW (ref >60)
Glucose, Bld: 193 mg/dL — ABNORMAL HIGH (ref 70–99)
Phosphorus: 4.1 mg/dL (ref 2.5–4.6)
Potassium: 4 mmol/L (ref 3.5–5.1)
Sodium: 135 mmol/L (ref 135–145)

## 2023-05-08 NOTE — Plan of Care (Signed)
 Palliative-   Chart reviewed. Noted pt insurance declined LTAC. Patient otherwise stable.  No acute Palliative needs at this time. Will continue to follow intermittently. Please call if any urgent Palliative needs arise.   Ocie Bob, AGNP-C Palliative Medicine  No charge

## 2023-05-08 NOTE — Progress Notes (Signed)
 PROGRESS NOTE    Harold [name]  ZYS:[id number] DOB: 1955-06-03 DOA: 03/27/2023 PCP: [name], MD   Brief Narrative:  Patient is a 68 year old male with morbid obesity, OSA, DM2, HTN, HLD, CAD/stent, A-fib on Eliquis, carotid artery stenosis, CKD, chronic anemia, anxiety/depression, diabetic neuropathy, GERD, hypothyroidism, COPD, pulmonary hypertension.  Patient presented with fever, cough, myalgia and generalized weakness and was found to be positive for influenza A.  Chest x-ray was negative for infiltrates.  Workup done during the hospital stay revealed right lower extremity wound infection/osteomyelitis of right ankle status post removal of hardware.  Cultures grew Enterobacter cloacae and MRSE.  Right lower extremity wound is healing well.  Antibiotics were managed by ID.  Patient monitored off antibiotics for a week, ID eventually signed off on 04/29/2023.  Apparently, patient developed worsening renal function on antibiotics.  Patient is on intermittent dialysis for severe and dense ATN.  Nephrology input is appreciated.   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 -Chest x-ray did not reveal any infiltrates.   -Improved with symptomatic treatment.  Patient has been on room air for very long time.   Right leg wound infection/osteomyelitis  right ankle s/p hardware removal Enterobacter Cloacea, MRSE  -Prior right ankle surgery (12/2022) complicated by postsurgical wound infection. -S/p right ankle hardware removal, I&D and wound VAC placement on 2/10 by Dr. Jena Gauss -S/p repeat debridement of the right ankle by Dr. Lajoyce Corners 04/02/2023.   -ID was consulted, initially on cefepime, subsequently on daptomycin and ertapenem, eventually antibiotics held due to worsening renal function.  Patient doing well without further signs of infection so ID eventually signed off on 04/29/2023.  No plan of antibiotics.   AKI on CKD stage IIIb / Nephrotic range proteinuria, uremia, Anion gap metabolic acidosis: -Baseline creatinine appears to be 1.9-2.2.   -Renal function has continued to progressively worsen -Nephrology following, renal biopsy deferred due to high risk of bleeding.  Patient was also on Plavix for recent stent for PAD  -TDC placed on 3/4, underwent first HD session and now is receiving intermittent dialysis.  Appreciate nephrology help.  Plan for him to start outpatient dialysis at [facility] eventually when discharged from rehab facility/LTAC.   Right facial droop -On 3/1 patient noted to have right facial droop, evaluated by neurology, MRI of the brain negative for acute stroke. -Currently on Plavix.   PAD -Seen by vascular surgery underwent aortogram, right leg angiogram, successful stenting of right superficial femoral artery on 04/01/2023.   -Continue Plavix, Zetia   Acute metabolic encephalopathy -Likely multifactorial, uremia, infection, drug-induced from possibility of cefepime induced encephalopathy, chronic worsening memory issues -Started on hemodialysis Continue Cymbalta, decreased Neurontin 04/26/2023: Encephalopathy resolved.   Type 2 diabetes mellitus / Diabetic neuropathy, POA: A1c 7 on 01/02/2023. PTA meds-Tresiba 60 units daily, Mounjaro.  Patient  currently on Semglee 28 units twice daily and SSI, blood sugar  better controlled.   Diabetic neuropathy -Gabapentin decreased due to worsening renal function. (PTA reportedly on gabapentin 1600 mg 3 times daily).  Currently on Cymbalta.   Hypokalemia/hypomagnesemia: resolved.   Paroxysmal A-fib with slow ventricular response: -HR noted to be in low 40s, cardiology consulted, Coreg held, and they recommended continuing amiodarone.  Heart rate has improved but still in bradycardia range in 50s.   -CHADS-VASc of 4.     Essential hypertension Coreg discontinued by cardiology blood pressure started rising, started on amlodipine 5 mg but then patient had orthostatic hypotension so amlodipine was discontinued.  This has been an issue in the past as well.  We will allow slightly higher blood pressure.  Check orthostatics every shift.    Anemia of chronic disease: Baseline hemoglobin between 10.5 11.5.   -H&H stable, borderline   CAD/stent Carotid artery stenosis HLD Continue PTA meds -Plavix, Crestor and Eliquis.   Hypothyroidism Continue Synthroid   Obstructive sleep apnea CPAP at bedtime    Gout Continue allopurinol, colchicine   Orthostatic hypotension -2D echo showed EF 55 to 60%, basal inferior hypokinesis, G2 DD.  Continue abdominal binder.  Now started on midodrine 05/07/2023.   Generalized debility -PT had recommended SNF however patient will have to pay out-of-pocket which he is not able to afford.   Dizziness/vertigo/double vision: Intermittent complaints of dizziness.  MRI brain completed on 04/19/2023 was unremarkable.  Low blood pressure is considered to be the cause.  Bradycardia was also suspected as one of the cause, Coreg has been discontinued and his heart rate is improving.  Symptoms have now resolved.   Morbid obesity: Estimated body mass index is 40.14 kg/m as calculated from the following:   Height as of this encounter: 6\' 1"  (1.854 m).   Weight as of this encounter: 138 kg. Weight loss and diet modification  counseled.  Disposition: Patient was medically ready for discharge 04/30/2023 however patient's spouse [name], who is the only other member living in the home was unavailable.  Per TOC, [name] will return in town on Friday.  However PT said "We need to practice wheelchair transfers with spouse which will likely not happen until late Friday. To set him up for success, it would be better if he could stay for dialysis on Saturday and discharge home from dialysis on Saturday" and based on their recommendations, we  planned for him to be discharged on Saturday/05/03/2023 after dialysis. However when PT/OT worked with him again on 05/02/2023, they were trying to simulate a normal day, getting dressed, getting into the wheelchair and walking in the hallway. After walking 25 feet pt rested in wheelchair for 10 minutes after attempting to stand x2 BP dropped to 82/40, pt had tremors in bilateral UE and was slurring his speech. This is with abdominal binder in place and TED hose on L leg.  PT did not think that patient can safely be discharged home.  We are pursuing LTAC at the moment.  P2P was requested by [employer].  I spoke to the medical director Dr. Joseph Art, who denied approval.  PT/OT, TOC and LTAC team was informed.  PT worked with him again and once again he became hypotensive, Given pt's continued drop in BP with positional change, PT continues to recommend continued therapy at [facility]. Discharge home pt jeopardizes not only his safety but that of his significant other.  Appeal in process.  DVT prophylaxis: Place TED hose Start: 05/01/23  1621 Place and maintain sequential compression device Start: 04/15/23 1549 SCDs Start: 04/02/23 1617   Code Status: Full Code  Family Communication:  None present at bedside.  Plan of care discussed with patient in length and he/she verbalized understanding and agreed with it.  Status is: Inpatient Remains inpatient appropriate because: Please see above under  disposition   Estimated body mass index is 35.98 kg/m as calculated from the following:   Height as of this encounter: 6\' 1"  (1.854 m).   Weight as of this encounter: 123.7 kg.  Pressure Injury 04/17/23 Ankle Right;Anterior Deep Tissue Pressure Injury - Purple or maroon localized area of discolored intact skin or blood-filled blister due to damage of underlying soft tissue from pressure and/or shear. (Active)  04/17/23 2040  Location: Ankle  Location Orientation: Right;Anterior  Staging: Deep Tissue Pressure Injury - Purple or maroon localized area of discolored intact skin or blood-filled blister due to damage of underlying soft tissue from pressure and/or shear.  Wound Description (Comments):   Present on Admission: Yes  Dressing Type Gauze (Comment) 05/07/23 2300   Nutritional Assessment: Body mass index is 35.98 kg/m.Marland Kitchen Seen by dietician.  I agree with the assessment and plan as outlined below: Nutrition Status:        . Skin Assessment: I have examined the patient's skin and I agree with the wound assessment as performed by the wound care RN as outlined below: Pressure Injury 04/17/23 Ankle Right;Anterior Deep Tissue Pressure Injury - Purple or maroon localized area of discolored intact skin or blood-filled blister due to damage of underlying soft tissue from pressure and/or shear. (Active)  04/17/23 2040  Location: Ankle  Location Orientation: Right;Anterior  Staging: Deep Tissue Pressure Injury - Purple or maroon localized area of discolored intact skin or blood-filled blister due to damage of underlying soft tissue from pressure and/or shear.  Wound Description (Comments):   Present on Admission: Yes  Dressing Type Gauze (Comment) 05/07/23 2300    Consultants:  Cardiology  Procedures:  As above   Objective: Vitals:   05/07/23 2010 05/07/23 2239 05/08/23 0500 05/08/23 0555  BP: (!) 154/64   (!) 162/64  Pulse: 69 60  62  Resp: 18 20  19   Temp: 97.8 F (36.6  C)   98.1 F (36.7 C)  TempSrc: Oral   Axillary  SpO2:  95%    Weight:   123.7 kg   Height:        Intake/Output Summary (Last 24 hours) at 05/08/2023 0755 Last data filed at 05/07/2023 2009 Gross per 24 hour  Intake --  Output 555 ml  Net -555 ml   Filed Weights   05/06/23 0915 05/06/23 1246 05/08/23 0500  Weight: 122.4 kg 122.4 kg 123.7 kg   Subjective: Patient seen and examined.  Sitting in the chair.  No complaints.  He refused taking midodrine this morning because his SBP was over 120 but I counseled him that we are trying to see if this medication works to improve his orthostatic hypotension and he is willing to take it.    Examination:  General exam: Appears calm and comfortable  Respiratory system: Clear to auscultation. Respiratory effort normal. Cardiovascular system: S1 & S2 heard, RRR. No JVD, murmurs, rubs, gallops or clicks. No pedal edema. Gastrointestinal system: Abdomen is nondistended, soft and nontender. No organomegaly or masses felt. Normal bowel sounds heard. Central nervous system: Alert and oriented. No focal neurological deficits. Extremities: Symmetric 5 x 5 power. Skin: No rashes, lesions or ulcers.  Psychiatry: Judgement and insight appear normal. Mood & affect appropriate.    Data Reviewed: I have personally reviewed following labs and imaging studies  CBC: Recent Labs  Lab 05/03/23 0900 05/06/23 0918  WBC 8.5 6.5  HGB 8.3* 8.0*  HCT 25.1* 24.3*  MCV 89.6 91.4  PLT 195 172   Basic Metabolic Panel: Recent Labs  Lab 05/03/23 0900 05/05/23 0547 05/06/23 0625 05/07/23 0407 05/08/23 0511  NA 135 136 137 136 135  K 3.7 4.3 4.2 4.6 4.0  CL 96* 100 99 99 101  CO2 24 27 26 30 26   GLUCOSE 109* 267* 169* 167* 193*  BUN 40* 34* 34* 22 28*  CREATININE 5.35* 4.68* 4.98* 3.71* 4.08*  CALCIUM 8.4* 8.5* 8.9 8.6* 8.2*  PHOS 4.5 4.3 4.6 3.5 4.1   GFR: Estimated Creatinine Clearance: 23.5 mL/min (A) (by C-G formula based on SCr of 4.08 mg/dL  (H)). Liver Function Tests: Recent Labs  Lab 05/03/23 0900 05/05/23 0547 05/06/23 0625 05/07/23 0407 05/08/23 0511  ALBUMIN 2.7* 2.6* 2.7* 2.6* 2.6*   No results for input(s): "LIPASE", "AMYLASE" in the last 168 hours. No results for input(s): "AMMONIA" in the last 168 hours. Coagulation Profile: No results for input(s): "INR", "PROTIME" in the last 168 hours. Cardiac Enzymes: No results for input(s): "CKTOTAL", "CKMB", "CKMBINDEX", "TROPONINI" in the last 168 hours.  BNP (last 3 results) No results for input(s): "PROBNP" in the last 8760 hours. HbA1C: No results for input(s): "HGBA1C" in the last 72 hours. CBG: Recent Labs  Lab 05/07/23 1133 05/07/23 1613 05/08/23 0105 05/08/23 0555 05/08/23 0732  GLUCAP 98 246* 173* 163* 155*   Lipid Profile: No results for input(s): "CHOL", "HDL", "LDLCALC", "TRIG", "CHOLHDL", "LDLDIRECT" in the last 72 hours. Thyroid Function Tests: No results for input(s): "TSH", "T4TOTAL", "FREET4", "T3FREE", "THYROIDAB" in the last 72 hours. Anemia Panel: No results for input(s): "VITAMINB12", "FOLATE", "FERRITIN", "TIBC", "IRON", "RETICCTPCT" in the last 72 hours.  Sepsis Labs: No results for input(s): "PROCALCITON", "LATICACIDVEN" in the last 168 hours.  No results found for this or any previous visit (from the past 240 hours).   Radiology Studies: No results found.    Scheduled Meds:  allopurinol  50 mg Oral Daily   amiodarone  200 mg Oral BID   apixaban  5 mg Oral BID   atorvastatin  40 mg Oral Daily   Chlorhexidine Gluconate Cloth  6 each Topical Q0600   clopidogrel  75 mg Oral Q breakfast   darbepoetin (ARANESP) injection - DIALYSIS  200 mcg Subcutaneous Q Fri-1800   docusate sodium  100 mg Oral BID   DULoxetine  60 mg Oral Daily   ezetimibe  10 mg Oral Daily   gabapentin  300 mg Oral QHS   Gerhardt's butt cream   Topical TID   guaiFENesin  600 mg Oral BID   insulin aspart  0-9 Units Subcutaneous Q6H   insulin glargine  28  Units Subcutaneous BID   levothyroxine  88 mcg Oral QAC breakfast   midodrine  5 mg Oral TID WC   polyethylene glycol  17 g Oral Daily   sodium chloride flush  3 mL Intravenous Q12H   sodium chloride flush  3-10 mL Intravenous Q12H   tamsulosin  0.4 mg Oral Daily   Continuous Infusions:   LOS: 42 days   Hughie Closs, MD [facility]  05/08/2023, 7:55 AM   *Please note that this is a verbal dictation therefore any spelling or grammatical errors are due to the "  [employer] One" system interpretation.  Please page via Amion and do not message via secure chat for urgent patient care matters. Secure chat can be used for non urgent patient care matters.  How to contact the [facility] Attending or Consulting provider 7A - 7P or covering provider during after hours 7P -7A, for this patient?  Check the care team in [facility] and look for a) attending/consulting TR[facility] provider listed and b) the [facility] team listed. Page or secure chat 7A-7P. Log into [email or web address] and use [facility] universal password to access. If you do not have the password, please contact the hospital operator. Locate the [facility] provider you are looking for under [facility] and page to a number that you can be directly reached. If you still have difficulty reaching the provider, please page the [name] (Director on Call) for the Hospitalists listed on amion for assistance.

## 2023-05-08 NOTE — Progress Notes (Signed)
 Physical Therapy Treatment Patient Details Name: Harold [name] MRN: [id number] DOB: 06/30/1955 Today's Date: 05/08/2023   History of Present Illness 68 y.o. male presents to [facility] 03/27/23 with productive cough and generalized weakness. Admitted with influenza A and cellulitis of R foot s/p hardware removal of the R ankle and application of wound vac on 2/10, wound vac removed 2/27. Left thigh pain, CT negative. Prior admit 11/24 for R ankle ORIF and in 12/24 for revision. S/p excisional debridement of right ankle on 2/12. Pt underwent central line placement on 04/07/2023 however he has been having issues with recurrent bleeding from the insertion site which has finally stopped. 04/17/23 pt with drop in sats requiring 2 L of O2 via [place], chest xray concerning for edema or possibly pneumonia.Significant PMH: HTN, HLD, CAD s/p PCI, COPD, DM2, CKD stage IIIb, PVD, gout, OSA, neuropathy, COPD, a-fib, CAD s/p percutaneous coronary angioplasty, angina, obesity    PT Comments  [facility] PT/OT Session: Pt is up in chair on entry, with good color in his face and agreeable to working on his gait. Pt reports having taken midodrine this morning. Pt also did not receive HD today and historically does better the second day after dialysis. Pt BP is better today (see below). While BP remained appropriate, pt is able to ambulate further but increased distance highlights how weakened pt LE are. Pt requires minAx2 for ambulation due to knee buckling and decreasing stability with distance. Pt requires seated rest break to recover before returning to room. D/c to [facility] remains pt's best option to insure pt safety with mobilization due to LE weakness and cardiopulmonary response to activity. PT will continue to see him acutely  Orthostatic BPs  Sitting without abdominal binder 115/56 (74)  Sitting with abdominal binder  124/60 (79)  Standing 102/47 (65)  Sitting  after 5 min 126/59 (79)  Standing after walking in hallway 122/50  (69)  Sitting  125/73 (73)  Sitting after 5 min  139/64 (86)  Sitting after ambulation back to room  124/53 (73)        If plan is discharge home, recommend the following: A lot of help with walking and/or transfers;A lot of help with bathing/dressing/bathroom;Assistance with cooking/housework;Assist for transportation;Help with stairs or ramp for entrance   Can travel by private vehicle     Yes  Equipment Recommendations  None recommended by PT       Precautions / Restrictions Precautions Precautions: Fall Recall of Precautions/Restrictions: Impaired Precaution/Restrictions Comments: Watch BP Required Braces or Orthoses: Other Brace Other Brace: abdominal binder (Prn) Restrictions Weight Bearing Restrictions Per Provider Order: Yes RLE Weight Bearing Per Provider Order: Weight bearing as tolerated Other Position/Activity Restrictions: per chat with Dr Lajoyce Corners, 3/12 pt no longer needs CAM boot per Dr. Audrie Lia note 04/03/2023 and orders 04/03/2023 pt WBAT     Mobility  Bed Mobility               General bed mobility comments: OOB in recliner    Transfers Overall transfer level: Needs assistance Equipment used: Rolling walker (2 wheels) Transfers: Sit to/from Stand Sit to Stand: Contact guard assist           General transfer comment: cueing for safety, good hand placement recall    Ambulation/Gait Ambulation/Gait assistance: Min assist, +2 physical assistance Gait Distance (Feet): 75 Feet Assistive device: Rolling walker (2 wheels) Gait Pattern/deviations: Step-through pattern, Trunk flexed, Decreased step length - right, Decreased step length - left, Shuffle, Knees buckling Gait velocity: reduced  Gait velocity interpretation: <1.31 ft/sec, indicative of household ambulator   General Gait Details: min A x to minAx2 for L knee buckling with weightshift to advance R LE,         Balance Overall balance assessment: Needs assistance Sitting-balance support: No  upper extremity supported, Feet supported Sitting balance-Leahy Scale: Fair     Standing balance support: Bilateral upper extremity supported, During functional activity, Reliant on assistive device for balance Standing balance-Leahy Scale: Poor Standing balance comment: reliant on UE support                            Communication Communication Communication: No apparent difficulties  Cognition Arousal: Alert Behavior During Therapy: Flat affect                             Following commands: Intact Following commands impaired: Follows one step commands with increased time    Cueing Cueing Techniques: Verbal cues, Tactile cues     General Comments General comments (skin integrity, edema, etc.): Pt BP is more stable today, pt reports use of midodrine this morning. PT slightly concerned with reduced increase in HR with activity started at 65 bpm, with ambulation, pt exhibits DOE and diaphoresis but HR only in mid 70s, Pt visibly fatigued with increased ambulation but is not wiped out like he is when his BP drops.      Pertinent Vitals/Pain Pain Assessment Pain Assessment: Faces Faces Pain Scale: Hurts a little bit Pain Location: R foot Pain Descriptors / Indicators: Discomfort, Grimacing Pain Intervention(s): Limited activity within patient's tolerance, Monitored during session, Repositioned    Home Living                          Prior Function            PT Goals (current goals can now be found in the care plan section) Acute Rehab PT Goals PT Goal Formulation: With patient Time For Goal Achievement: 05/08/23 Potential to Achieve Goals: Fair Progress towards PT goals: Progressing toward goals;Goals updated    Frequency    Min 1X/week       Co-evaluation PT/OT/SLP Co-Evaluation/Treatment: Yes Reason for Co-Treatment: For patient/therapist safety;To address functional/ADL transfers PT goals addressed during session:  Mobility/safety with mobility;Proper use of DME;Balance OT goals addressed during session: ADL's and self-care;Strengthening/ROM      AM-PAC PT "6 Clicks" Mobility   Outcome Measure  Help needed turning from your back to your side while in a flat bed without using bedrails?: A Little Help needed moving from lying on your back to sitting on the side of a flat bed without using bedrails?: A Little Help needed moving to and from a bed to a chair (including a wheelchair)?: A Little Help needed standing up from a chair using your arms (e.g., wheelchair or bedside chair)?: A Lot Help needed to walk in hospital room?: Total Help needed climbing 3-5 steps with a railing? : Total 6 Click Score: 13    End of Session Equipment Utilized During Treatment: Gait belt Activity Tolerance: Patient limited by fatigue Patient left: in chair;with call bell/phone within reach;with chair alarm set Nurse Communication: Mobility status PT Visit Diagnosis: Other abnormalities of gait and mobility (R26.89);Unsteadiness on feet (R26.81);Muscle weakness (generalized) (M62.81);History of falling (Z91.81);Difficulty in walking, not elsewhere classified (R26.2)     Time: 0865-7846 PT Time Calculation (min) (  ACUTE ONLY): 39 min  Charges:    $Gait Training: 8-22 mins PT General Charges $$ ACUTE PT VISIT: 1 Visit                     Babatunde Seago B. Beverely Risen PT, DPT Acute Rehabilitation Services Please use secure chat or  Call Office [phone]    Elon Alas Fleet 05/08/2023, 1:55 PM

## 2023-05-08 NOTE — Progress Notes (Signed)
 Pt's case discussed with team this morning. Appeal for LTAC is still pending. Team advised that if pt is d/c to LTAC or pt continues to require inpt hospitalization, pt's referral to [facility] will need to be canceled until pt's d/c date can be confirmed. Pt's appt at clinic cannot be held for an extended period of time. Case discussed with clinic manager this afternoon as well. Will await determination from LTAC. Will assist as needed.   Olivia Canter Renal Navigator [phone]

## 2023-05-08 NOTE — Progress Notes (Signed)
 [facility] NEPHROLOGY PROGRESS NOTE  Assessment/ Plan: Dialysis on Saturday Pt is a 68 y.o. yo male    # Dialysis dependent AKI on CKD 3; ATN and AIN both possible. -CLIP as AKI to [place] start 3/11 if needed. -  He is nonoliguric - BUN and crt rising more slowly in between tx.   Dialysis on Saturday;  tolerated HD on 3/18 and continue to watch for signs of recovery. UOP dropped to /24hr. Will hold HD for another day -> if renal function continues to worsen likely will dialyze either Fri or Sat.   CLIP'd as AKI [facility] but waiting on dispo (LTAC)  # Hypertension with tendency towards orthostasis on low-dose carvedilol and tamsulosin. Ease off on UF as seems euvolemic and is nonoliguric  # Nephrotic range proteinuria with negative serological evaluation but decision against biopsy at the current time per previous notes.  # Right lower extremity infection with involvement of orthopedic hardware status post removal and antibiotics discontinued 2/28 with concern for AIN.  #Mild hyponatremia, continue to limit free water intake.  Corrected with more UF with HD  # Increased anion gap metabolic acidosis, managed with dialysis.  # Anemia-   iron ok  and darbo 200 qwk (fri) last given 3/14  # Dispo-  now looking into LTAC; need to increase stamina and strength prior to discharge   Subjective:   no new issues -  s/p HD on Wed.   Due to stamina/strength issues-  looking into possible LTAC  -  1L/585mL UOP  Objective Vital signs in last 24 hours: Vitals:   05/07/23 2010 05/07/23 2239 05/08/23 0500 05/08/23 0555  BP: (!) 154/64   (!) 162/64  Pulse: 69 60  62  Resp: 18 20  19   Temp: 97.8 F (36.6 C)   98.1 F (36.7 C)  TempSrc: Oral   Axillary  SpO2:  95%    Weight:   123.7 kg   Height:       Weight change: 1.296 kg  Intake/Output Summary (Last 24 hours) at 05/08/2023 0750 Last data filed at 05/07/2023 2009 Gross per 24 hour  Intake --  Output 555 ml  Net  -555 ml       Labs: RENAL PANEL Recent Labs  Lab 05/03/23 0900 05/05/23 0547 05/06/23 0625 05/07/23 0407 05/08/23 0511  NA 135 136 137 136 135  K 3.7 4.3 4.2 4.6 4.0  CL 96* 100 99 99 101  CO2 24 27 26 30 26   GLUCOSE 109* 267* 169* 167* 193*  BUN 40* 34* 34* 22 28*  CREATININE 5.35* 4.68* 4.98* 3.71* 4.08*  CALCIUM 8.4* 8.5* 8.9 8.6* 8.2*  PHOS 4.5 4.3 4.6 3.5 4.1  ALBUMIN 2.7* 2.6* 2.7* 2.6* 2.6*    Liver Function Tests: Recent Labs  Lab 05/06/23 0625 05/07/23 0407 05/08/23 0511  ALBUMIN 2.7* 2.6* 2.6*   No results for input(s): "LIPASE", "AMYLASE" in the last 168 hours. No results for input(s): "AMMONIA" in the last 168 hours. CBC: Recent Labs    03/28/23 0810 03/29/23 0407 04/27/23 0530 04/28/23 0530 05/01/23 0703 05/03/23 0900 05/06/23 0918  HGB  --    < > 7.7* 7.8* 7.9* 8.3* 8.0*  MCV  --    < > 86.1 86.6 88.1 89.6 91.4  VITAMINB12 305  --   --   --   --   --   --   FERRITIN  --   --   --   --   --  179  --   TIBC  --   --   --   --   --  190*  --   IRON  --   --   --   --   --  43*  --    < > = values in this interval not displayed.    Cardiac Enzymes: No results for input(s): "CKTOTAL", "CKMB", "CKMBINDEX", "TROPONINI" in the last 168 hours.  CBG: Recent Labs  Lab 05/07/23 1133 05/07/23 1613 05/08/23 0105 05/08/23 0555 05/08/23 0732  GLUCAP 98 246* 173* 163* 155*    Iron Studies:  No results for input(s): "IRON", "TIBC", "TRANSFERRIN", "FERRITIN" in the last 72 hours.  Studies/Results: No results found.    Medications: Infusions:    Scheduled Medications:  allopurinol  50 mg Oral Daily   amiodarone  200 mg Oral BID   apixaban  5 mg Oral BID   atorvastatin  40 mg Oral Daily   Chlorhexidine Gluconate Cloth  6 each Topical Q0600   clopidogrel  75 mg Oral Q breakfast   darbepoetin (ARANESP) injection - DIALYSIS  200 mcg Subcutaneous Q Fri-1800   docusate sodium  100 mg Oral BID   DULoxetine  60 mg Oral Daily   ezetimibe   10 mg Oral Daily   gabapentin  300 mg Oral QHS   Gerhardt's butt cream   Topical TID   guaiFENesin  600 mg Oral BID   insulin aspart  0-9 Units Subcutaneous Q6H   insulin glargine  28 Units Subcutaneous BID   levothyroxine  88 mcg Oral QAC breakfast   midodrine  5 mg Oral TID WC   polyethylene glycol  17 g Oral Daily   sodium chloride flush  3 mL Intravenous Q12H   sodium chloride flush  3-10 mL Intravenous Q12H   tamsulosin  0.4 mg Oral Daily    have reviewed scheduled and prn medications.  Physical Exam: General:NAD, comfortable Heart:RRR, s1s2 nl Lungs: Reduced breath sound bilateral basal Abdomen:soft, Non-tender, distended Extremities:no edema  Dialysis Access: LIJ TDC in place  Laketa Sandoz W 05/08/2023,7:50 AM  LOS: 42 days

## 2023-05-08 NOTE — Progress Notes (Signed)
 Occupational Therapy Treatment Patient Details Name: Harold [name] MRN: [id number] DOB: [birth year] Today's Date: 05/08/2023   History of present illness 68 y.o. male presents to [facility] 03/27/23 with productive cough and generalized weakness. Admitted with influenza A and cellulitis of R foot s/p hardware removal of the R ankle and application of wound vac on 2/10, wound vac removed 2/27. Left thigh pain, CT negative. Prior admit 11/24 for R ankle ORIF and in 12/24 for revision. S/p excisional debridement of right ankle on 2/12. Pt underwent central line placement on 04/07/2023 however he has been having issues with recurrent bleeding from the insertion site which has finally stopped. 04/17/23 pt with drop in sats requiring 2 L of O2 via [facility], chest xray concerning for edema or possibly pneumonia.Significant PMH: HTN, HLD, CAD s/p PCI, COPD, DM2, CKD stage IIIb, PVD, gout, OSA, neuropathy, COPD, a-fib, CAD s/p percutaneous coronary angioplasty, angina, obesity   OT comments  STAR patient OT/PT session: Patient up in recliner upon entry and not wearing abdominal binder. BP without binder seated 115/56 (74) and with abdominal binder 124/60 (79). Patient performed mobility, transfers, and static standing with decreased episodes of soft BP but weakness when standing and knee buckling. OT plans to address toilet transfers with [facility] over toilet for tomorrow session. Acute OT to continue to follow to address established goals to facilitate DC to next venue of care.       If plan is discharge home, recommend the following:  A lot of help with bathing/dressing/bathroom;Assistance with cooking/housework;Help with stairs or ramp for entrance;Assist for transportation;Two people to help with walking and/or transfers   Equipment Recommendations  Other (comment) (bariatric BSC)    Recommendations for Other Services      Precautions / Restrictions Precautions Precautions: Fall Recall of Precautions/Restrictions:  Impaired Precaution/Restrictions Comments: Watch BP Required Braces or Orthoses: Other Brace Other Brace: abdominal binder (Prn) Restrictions Weight Bearing Restrictions Per Provider Order: Yes RLE Weight Bearing Per Provider Order: Weight bearing as tolerated Other Position/Activity Restrictions: per chat with Dr Lajoyce Corners, 3/12 pt no longer needs CAM boot per Dr. Audrie Lia note 04/03/2023 and orders 04/03/2023 pt WBAT       Mobility Bed Mobility               General bed mobility comments: OOB in recliner    Transfers Overall transfer level: Needs assistance Equipment used: Rolling walker (2 wheels) Transfers: Sit to/from Stand Sit to Stand: Contact guard assist           General transfer comment: cueing for safety, good hand placement recall     Balance Overall balance assessment: Needs assistance Sitting-balance support: No upper extremity supported, Feet supported Sitting balance-Leahy Scale: Fair     Standing balance support: Bilateral upper extremity supported, During functional activity, Reliant on assistive device for balance Standing balance-Leahy Scale: Poor Standing balance comment: reliant on UE support                           ADL either performed or assessed with clinical judgement   ADL Overall ADL's : Needs assistance/impaired     Grooming: Set up;Sitting           Upper Body Dressing : Supervision/safety;Set up;Sitting   Lower Body Dressing: Maximal assistance;Sit to/from stand Lower Body Dressing Details (indicate cue type and reason): assistance with underwear Toilet Transfer: Minimal assistance;Rolling walker (2 wheels) Toilet Transfer Details (indicate cue type and reason): min guard sit to  stand with RW         Functional mobility during ADLs: Contact guard assist;Rolling walker (2 wheels) General ADL Comments: BP monitoring sitting and standing, orthostatic in standing but recovered after sitting    Extremity/Trunk  Assessment              Vision       Perception     Praxis     Communication Communication Communication: No apparent difficulties   Cognition Arousal: Alert Behavior During Therapy: Flat affect                                 Following commands: Intact Following commands impaired: Follows one step commands with increased time      Cueing   Cueing Techniques: Verbal cues, Tactile cues  Exercises      Shoulder Instructions       General Comments see PT note for BP measurements    Pertinent Vitals/ Pain       Pain Assessment Pain Assessment: Faces Faces Pain Scale: Hurts a little bit Pain Location: R foot Pain Descriptors / Indicators: Discomfort, Grimacing Pain Intervention(s): Limited activity within patient's tolerance, Monitored during session, Repositioned  Home Living                                          Prior Functioning/Environment              Frequency  Min 2X/week        Progress Toward Goals  OT Goals(current goals can now be found in the care plan section)  Progress towards OT goals: Progressing toward goals  Acute Rehab OT Goals Patient Stated Goal: to go home OT Goal Formulation: With patient Time For Goal Achievement: 05/22/23 Potential to Achieve Goals: Good ADL Goals Pt Will Perform Grooming: with contact guard assist;standing Pt Will Perform Lower Body Dressing:  (Patient has support at home, not applicable, goal D/C 3/20) Pt Will Transfer to Toilet: with supervision;bedside commode;stand pivot transfer;ambulating Pt Will Perform Toileting - Clothing Manipulation and hygiene: with min assist;sit to/from stand;sitting/lateral leans Pt/caregiver will Perform Home Exercise Program: Increased strength;Both right and left upper extremity;With written HEP provided;With theraband Additional ADL Goal #1: Pt will demonstrate ability to tolerate standing during ADLs for 2 minutes with no more than  min guard support.  Plan      Co-evaluation    PT/OT/SLP Co-Evaluation/Treatment: Yes Reason for Co-Treatment: For patient/therapist safety;To address functional/ADL transfers PT goals addressed during session: Mobility/safety with mobility;Proper use of DME;Balance OT goals addressed during session: ADL's and self-care;Strengthening/ROM      AM-PAC OT "6 Clicks" Daily Activity     Outcome Measure   Help from another person eating meals?: None Help from another person taking care of personal grooming?: A Little Help from another person toileting, which includes using toliet, bedpan, or urinal?: A Lot Help from another person bathing (including washing, rinsing, drying)?: A Lot Help from another person to put on and taking off regular upper body clothing?: A Little Help from another person to put on and taking off regular lower body clothing?: A Lot 6 Click Score: 16    End of Session Equipment Utilized During Treatment: Gait belt;Rolling walker (2 wheels)  OT Visit Diagnosis: Unsteadiness on feet (R26.81);Other abnormalities of gait and mobility (R26.89);Muscle weakness (generalized) (M62.81)  Pain - Right/Left: Right Pain - part of body: Ankle and joints of foot   Activity Tolerance Patient tolerated treatment well   Patient Left in chair;with call bell/phone within reach;with chair alarm set   Nurse Communication Mobility status        Time: 7829-5621 OT Time Calculation (min): 39 min  Charges: OT General Charges $OT Visit: 1 Visit OT Treatments $Self Care/Home Management : 8-22 mins  Alfonse Flavors, OTA Acute Rehabilitation Services  Office [phone]   Dewain Penning 05/08/2023, 3:09 PM

## 2023-05-08 NOTE — TOC Progression Note (Signed)
 Transition of Care [facility]) - Progression Note    Patient Details  Name: Harold [name] MRN: [id number] Date of Birth: 1955/10/21  [facility] of [facility]) CM/SW Contact  Graves-Bigelow, Lamar Laundry, RN Phone Number: 05/08/2023, 12:00 PM  Clinical Narrative: Patient was discussed in progression rounds. Case Manager awaiting to hear back from insurance regarding expedited appeal for LTAC.    Expected Discharge Plan: Long Term Acute Care (LTAC) Barriers to Discharge: No Barriers Identified  Expected Discharge Plan and Services In-house Referral: Clinical Social Work Discharge Planning Services: CM Consult Post Acute Care Choice: Home Health Living arrangements for the past 2 months: Single Family Home Expected Discharge Date: 04/10/23               DME Arranged: Bedside commode (bariatric) DME Agency:  Julianne Rice) Date DME Agency Contacted: 04/29/23 Time DME Agency Contacted: [phone] Representative spoke with at DME Agency: Vaughan Basta HH Arranged: RN, Disease Management, PT, OT, Nurse's Aide HH Agency: [facility]  Social Determinants of Health (SDOH) Interventions SDOH Screenings   Food Insecurity: No Food Insecurity (03/27/2023)  Housing: Low Risk  (03/28/2023)  Transportation Needs: No Transportation Needs (03/27/2023)  Utilities: Not At Risk (03/28/2023)  Social Connections: Unknown (03/28/2023)  Tobacco Use: Medium Risk (04/02/2023)    Readmission Risk Interventions     No data to display

## 2023-05-08 NOTE — Progress Notes (Signed)
 Occupational Therapy Treatment Patient Details Name: Harold [name] MRN: [id number] DOB: [birth year] Today's Date: 05/08/2023   History of present illness 68 y.o. male presents to [facility] 03/27/23 with productive cough and generalized weakness. Admitted with influenza A and cellulitis of R foot s/p hardware removal of the R ankle and application of wound vac on 2/10, wound vac removed 2/27. Left thigh pain, CT negative. Prior admit 11/24 for R ankle ORIF and in 12/24 for revision. S/p excisional debridement of right ankle on 2/12. Pt underwent central line placement on 04/07/2023 however he has been having issues with recurrent bleeding from the insertion site which has finally stopped. 04/17/23 pt with drop in sats requiring 2 L of O2 via [place], chest xray concerning for edema or possibly pneumonia.Significant PMH: HTN, HLD, CAD s/p PCI, COPD, DM2, CKD stage IIIb, PVD, gout, OSA, neuropathy, COPD, a-fib, CAD s/p percutaneous coronary angioplasty, angina, obesity   OT comments  Pt seated in recliner, OTA donning abd binder.  Pt transferring into standing using RW with min guard, remains limited by orthostatic BP, weakness in standing and impaired balance. Will have support for LB ADLs, 24/7 supervision. Updated goals. Will follow acutely.       If plan is discharge home, recommend the following:  A lot of help with bathing/dressing/bathroom;Assistance with cooking/housework;Help with stairs or ramp for entrance;Assist for transportation;Two people to help with walking and/or transfers   Equipment Recommendations  Other (comment) (bariatric BSC)    Recommendations for Other Services      Precautions / Restrictions Precautions Precautions: Fall Recall of Precautions/Restrictions: Impaired Precaution/Restrictions Comments: Watch BP Required Braces or Orthoses: Other Brace Other Brace: abdominal binder (Prn) Restrictions Weight Bearing Restrictions Per Provider Order: Yes RLE Weight Bearing Per  Provider Order: Weight bearing as tolerated Other Position/Activity Restrictions: per chat with Dr Lajoyce Corners, 3/12 pt no longer needs CAM boot per Dr. Audrie Lia note 04/03/2023 and orders 04/03/2023 pt WBAT       Mobility Bed Mobility               General bed mobility comments: OOB in recliner    Transfers Overall transfer level: Needs assistance Equipment used: Rolling walker (2 wheels) Transfers: Sit to/from Stand Sit to Stand: Contact guard assist           General transfer comment: cueing for safety, good hand placement recall     Balance Overall balance assessment: Needs assistance Sitting-balance support: No upper extremity supported, Feet supported Sitting balance-Leahy Scale: Fair     Standing balance support: Bilateral upper extremity supported, During functional activity, Reliant on assistive device for balance Standing balance-Leahy Scale: Poor Standing balance comment: reliant on UE support                           ADL either performed or assessed with clinical judgement   ADL Overall ADL's : Needs assistance/impaired     Grooming: Set up;Sitting           Upper Body Dressing : Supervision/safety;Set up;Sitting     Lower Body Dressing Details (indicate cue type and reason): NT, has assist from spouse at home   Toilet Transfer Details (indicate cue type and reason): min guard sit to stand with RW         Functional mobility during ADLs: Contact guard assist;Rolling walker (2 wheels) General ADL Comments: BP monitoring sitting and standing, orthostatic in standing but recovered after sitting    Extremity/Trunk Assessment  Vision       Perception     Praxis     Communication Communication Communication: No apparent difficulties   Cognition Arousal: Alert Behavior During Therapy: Flat affect Cognition: No apparent impairments                               Following commands: Intact Following  commands impaired: Follows one step commands with increased time      Cueing   Cueing Techniques: Verbal cues, Tactile cues  Exercises      Shoulder Instructions       General Comments see OTA/PT notes for BPs; dicussed options for compesnatory techniques to allow for increased safety at home; pt appears to not be flexible to use commode outside bathroom    Pertinent Vitals/ Pain       Pain Assessment Pain Assessment: Faces Faces Pain Scale: Hurts a little bit Pain Location: generalized Pain Descriptors / Indicators: Discomfort, Grimacing Pain Intervention(s): Limited activity within patient's tolerance, Monitored during session, Repositioned  Home Living                                          Prior Functioning/Environment              Frequency  Min 2X/week        Progress Toward Goals  OT Goals(current goals can now be found in the care plan section)  Progress towards OT goals: Goals updated  Acute Rehab OT Goals Patient Stated Goal: home OT Goal Formulation: With patient Time For Goal Achievement: 05/22/23 Potential to Achieve Goals: Good  Plan      Co-evaluation                 AM-PAC OT "6 Clicks" Daily Activity     Outcome Measure   Help from another person eating meals?: None Help from another person taking care of personal grooming?: A Little Help from another person toileting, which includes using toliet, bedpan, or urinal?: A Lot Help from another person bathing (including washing, rinsing, drying)?: A Lot Help from another person to put on and taking off regular upper body clothing?: A Little Help from another person to put on and taking off regular lower body clothing?: A Lot 6 Click Score: 16    End of Session Equipment Utilized During Treatment: Gait belt;Rolling walker (2 wheels)  OT Visit Diagnosis: Unsteadiness on feet (R26.81);Other abnormalities of gait and mobility (R26.89);Muscle weakness (generalized)  (M62.81)   Activity Tolerance Patient tolerated treatment well   Patient Left in chair;with call bell/phone within reach;Other (comment) (PT/OTA present)   Nurse Communication Mobility status        Time: 1610-9604 OT Time Calculation (min): 11 min  Charges: OT General Charges $OT Visit: 1 Visit OT Treatments $Self Care/Home Management : 8-22 mins  Harold [name], OT Acute Rehabilitation Services Office [phone] Secure Chat Preferred    Harold [name] 05/08/2023, 12:26 PM

## 2023-05-09 ENCOUNTER — Other Ambulatory Visit (HOSPITAL_COMMUNITY): Payer: Self-pay

## 2023-05-09 DIAGNOSIS — L02415 Cutaneous abscess of right lower limb: Secondary | ICD-10-CM | POA: Diagnosis not present

## 2023-05-09 LAB — GLUCOSE, CAPILLARY
Glucose-Capillary: 110 mg/dL — ABNORMAL HIGH (ref 70–99)
Glucose-Capillary: 174 mg/dL — ABNORMAL HIGH (ref 70–99)
Glucose-Capillary: 187 mg/dL — ABNORMAL HIGH (ref 70–99)
Glucose-Capillary: 74 mg/dL (ref 70–99)
Glucose-Capillary: 78 mg/dL (ref 70–99)

## 2023-05-09 LAB — RENAL FUNCTION PANEL
Albumin: 2.6 g/dL — ABNORMAL LOW (ref 3.5–5.0)
Anion gap: 6 (ref 5–15)
BUN: 30 mg/dL — ABNORMAL HIGH (ref 8–23)
CO2: 27 mmol/L (ref 22–32)
Calcium: 8.2 mg/dL — ABNORMAL LOW (ref 8.9–10.3)
Chloride: 105 mmol/L (ref 98–111)
Creatinine, Ser: 4.23 mg/dL — ABNORMAL HIGH (ref 0.61–1.24)
GFR, Estimated: 14 mL/min — ABNORMAL LOW (ref >60)
Glucose, Bld: 102 mg/dL — ABNORMAL HIGH (ref 70–99)
Phosphorus: 4.6 mg/dL (ref 2.5–4.6)
Potassium: 3.9 mmol/L (ref 3.5–5.1)
Sodium: 138 mmol/L (ref 135–145)

## 2023-05-09 NOTE — Plan of Care (Signed)
  Problem: Clinical Measurements: Goal: Ability to maintain clinical measurements within normal limits will improve Outcome: Progressing Goal: Will remain free from infection Outcome: Progressing Goal: Diagnostic test results will improve Outcome: Progressing Goal: Cardiovascular complication will be avoided Outcome: Progressing   Problem: Nutrition: Goal: Adequate nutrition will be maintained Outcome: Progressing   

## 2023-05-09 NOTE — Progress Notes (Signed)
 Occupational Therapy Treatment Patient Details Name: Harold [name] MRN: [id number] DOB: [birth year] Today's Date: 05/09/2023   History of present illness 68 y.o. male presents to [facility] 03/27/23 with productive cough and generalized weakness. Admitted with influenza A and cellulitis of R foot s/p hardware removal of the R ankle and application of wound vac on 2/10, wound vac removed 2/27. Left thigh pain, CT negative. Prior admit 11/24 for R ankle ORIF and in 12/24 for revision. S/p excisional debridement of right ankle on 2/12. Pt underwent central line placement on 04/07/2023 however he has been having issues with recurrent bleeding from the insertion site which has finally stopped. 04/17/23 pt with drop in sats requiring 2 L of O2 via [place], chest xray concerning for edema or possibly pneumonia.Significant PMH: HTN, HLD, CAD s/p PCI, COPD, DM2, CKD stage IIIb, PVD, gout, OSA, neuropathy, COPD, a-fib, CAD s/p percutaneous coronary angioplasty, angina, obesity   OT comments  STAR Patient OT/PT session: Patient up in recliner without abdominal binder with BP 116/61(77) seated. Abdominal binder donned with BP 119/59(76) seated. Patient performed static standing from recliner with 85/47(58) but no complaints of dizziness. Patient able to ambulate to bathroom with min/CGA and BP was 95/68(75). Patient ambulated back to recliner from bathroom with BP 94/63(73). Patient performed mobility in hallway with min/CGA with BP 98/49(65). Patient ambulated back to room and at end of session seated in recliner BP 119/50(70). Patient educated on possible need to use BSC at home to avoid walking to bathroom when BP is soft or following HD. Patient to continue to be followed by acute OT to address established goals to facilitate safe DC.       If plan is discharge home, recommend the following:  A lot of help with bathing/dressing/bathroom;Assistance with cooking/housework;Help with stairs or ramp for entrance;Assist for  transportation;Two people to help with walking and/or transfers   Equipment Recommendations  Other (comment) (bariatric BSC)    Recommendations for Other Services      Precautions / Restrictions Precautions Precautions: Fall Recall of Precautions/Restrictions: Impaired Precaution/Restrictions Comments: Watch BP Required Braces or Orthoses: Other Brace Other Brace: abdominal binder (Prn) Restrictions Weight Bearing Restrictions Per Provider Order: Yes RLE Weight Bearing Per Provider Order: Weight bearing as tolerated Other Position/Activity Restrictions: per chat with Dr Lajoyce Corners, 3/12 pt no longer needs CAM boot per Dr. Audrie Lia note 04/03/2023 and orders 04/03/2023 pt WBAT       Mobility Bed Mobility Overal bed mobility: Needs Assistance             General bed mobility comments: OOB in recliner    Transfers Overall transfer level: Needs assistance Equipment used: Rolling walker (2 wheels) Transfers: Sit to/from Stand Sit to Stand: Contact guard assist           General transfer comment: CGA for sit to stands and min/CGA for transfers with RW     Balance Overall balance assessment: Needs assistance Sitting-balance support: No upper extremity supported, Feet supported Sitting balance-Leahy Scale: Fair   Postural control: Posterior lean Standing balance support: Bilateral upper extremity supported, During functional activity, Reliant on assistive device for balance Standing balance-Leahy Scale: Poor Standing balance comment: reliant on UE support                           ADL either performed or assessed with clinical judgement   ADL Overall ADL's : Needs assistance/impaired Eating/Feeding: Set up   Grooming: Set up;Sitting  Toilet Transfer: Minimal assistance;Rolling walker (2 wheels) Toilet Transfer Details (indicate cue type and reason): patient able to ambulate to bathroom in room and out with min assist and cues for  safety                Extremity/Trunk Assessment              Vision       Perception     Praxis     Communication Communication Communication: No apparent difficulties   Cognition Arousal: Alert Behavior During Therapy: Flat affect Cognition: No apparent impairments                               Following commands: Intact Following commands impaired: Follows one step commands with increased time      Cueing   Cueing Techniques: Verbal cues, Tactile cues  Exercises      Shoulder Instructions       General Comments BP measurements in note    Pertinent Vitals/ Pain       Pain Assessment Pain Assessment: Faces Faces Pain Scale: Hurts a little bit Pain Location: R foot Pain Descriptors / Indicators: Discomfort, Grimacing Pain Intervention(s): Limited activity within patient's tolerance, Monitored during session, Repositioned  Home Living                                          Prior Functioning/Environment              Frequency  Min 2X/week        Progress Toward Goals  OT Goals(current goals can now be found in the care plan section)  Progress towards OT goals: Progressing toward goals  Acute Rehab OT Goals Patient Stated Goal: to go home OT Goal Formulation: With patient Time For Goal Achievement: 05/22/23 Potential to Achieve Goals: Good ADL Goals Pt Will Perform Grooming: with contact guard assist;standing Pt Will Transfer to Toilet: with supervision;bedside commode;stand pivot transfer;ambulating Pt Will Perform Toileting - Clothing Manipulation and hygiene: with min assist;sit to/from stand;sitting/lateral leans Pt/caregiver will Perform Home Exercise Program: Increased strength;Both right and left upper extremity;With written HEP provided;With theraband Additional ADL Goal #1: Pt will demonstrate ability to tolerate standing during ADLs for 2 minutes with no more than min guard support.  Plan       Co-evaluation    PT/OT/SLP Co-Evaluation/Treatment: Yes Reason for Co-Treatment: For patient/therapist safety;To address functional/ADL transfers PT goals addressed during session: Mobility/safety with mobility;Proper use of DME;Balance OT goals addressed during session: ADL's and self-care;Strengthening/ROM      AM-PAC OT "6 Clicks" Daily Activity     Outcome Measure   Help from another person eating meals?: None Help from another person taking care of personal grooming?: A Little Help from another person toileting, which includes using toliet, bedpan, or urinal?: A Little Help from another person bathing (including washing, rinsing, drying)?: A Lot Help from another person to put on and taking off regular upper body clothing?: A Little Help from another person to put on and taking off regular lower body clothing?: A Lot 6 Click Score: 17    End of Session Equipment Utilized During Treatment: Gait belt;Rolling walker (2 wheels)  OT Visit Diagnosis: Unsteadiness on feet (R26.81);Other abnormalities of gait and mobility (R26.89);Muscle weakness (generalized) (M62.81) Pain - Right/Left: Right Pain - part of body:  Ankle and joints of foot   Activity Tolerance Patient tolerated treatment well   Patient Left in chair;with call bell/phone within reach;with chair alarm set   Nurse Communication Mobility status        Time: 4782-9562 OT Time Calculation (min): 28 min  Charges: OT General Charges $OT Visit: 1 Visit OT Treatments $Self Care/Home Management : 8-22 mins  Alfonse Flavors, OTA Acute Rehabilitation Services  Office [phone]   Dewain Penning 05/09/2023, 2:53 PM

## 2023-05-09 NOTE — Progress Notes (Signed)
 [facility] NEPHROLOGY PROGRESS NOTE  Assessment/ Plan: Dialysis on Saturday Pt is a 68 y.o. yo male    # Dialysis dependent AKI on CKD 3; ATN and AIN both possible. -CLIP as AKI to [place] start 3/11 if needed. -  He is nonoliguric - BUN and crt rising more slowly in between tx.   Dialysis on Saturday;  tolerated HD on 3/18 and continue to watch for signs of recovery. UOP dropped to but then has incr to 1875mL/24hr. will continue holding HD; likely will not be dialyzing over the weekend unless there are absolute indications.  Patient certainly is not uremic.  If numbers do not stabilized then we will plan on dialysis on Monday with no UF.  CLIP'd as AKI [facility] but waiting on dispo (LTAC)  # Hypertension with tendency towards orthostasis on low-dose carvedilol and tamsulosin. Ease off on UF as seems euvolemic and is nonoliguric  # Nephrotic range proteinuria with negative serological evaluation but decision against biopsy at the current time per previous notes.  # Right lower extremity infection with involvement of orthopedic hardware status post removal and antibiotics discontinued 2/28 with concern for AIN.  #Mild hyponatremia, continue to limit free water intake.  Corrected with more UF with HD  # Increased anion gap metabolic acidosis, managed with dialysis.  # Anemia-   iron ok  and darbo 200 qwk (fri) last given 3/14  # Dispo-  now looking into LTAC; need to increase stamina and strength prior to discharge   Subjective:   no new issues -  s/p HD on Wed.   Due to stamina/strength issues-  looking into possible LTAC.  Objective Vital signs in last 24 hours: Vitals:   05/08/23 1535 05/08/23 2012 05/09/23 0500 05/09/23 0738  BP: (!) 151/64 (!) 132/57 (!) 161/65 (!) 161/64  Pulse: 60 (!) 58 60 61  Resp: 20 20 18 17   Temp: 97.8 F (36.6 C) 98.5 F (36.9 C) 97.6 F (36.4 C) 97.8 F (36.6 C)  TempSrc: Axillary Axillary Axillary Axillary  SpO2: 97% 97% 99%  96%  Weight:      Height:       Weight change:   Intake/Output Summary (Last 24 hours) at 05/09/2023 0805 Last data filed at 05/09/2023 0500 Gross per 24 hour  Intake 519 ml  Output 1860 ml  Net -1341 ml       Labs: RENAL PANEL Recent Labs  Lab 05/05/23 0547 05/06/23 0625 05/07/23 0407 05/08/23 0511 05/09/23 0414  NA 136 137 136 135 138  K 4.3 4.2 4.6 4.0 3.9  CL 100 99 99 101 105  CO2 27 26 30 26 27   GLUCOSE 267* 169* 167* 193* 102*  BUN 34* 34* 22 28* 30*  CREATININE 4.68* 4.98* 3.71* 4.08* 4.23*  CALCIUM 8.5* 8.9 8.6* 8.2* 8.2*  PHOS 4.3 4.6 3.5 4.1 4.6  ALBUMIN 2.6* 2.7* 2.6* 2.6* 2.6*    Liver Function Tests: Recent Labs  Lab 05/07/23 0407 05/08/23 0511 05/09/23 0414  ALBUMIN 2.6* 2.6* 2.6*   No results for input(s): "LIPASE", "AMYLASE" in the last 168 hours. No results for input(s): "AMMONIA" in the last 168 hours. CBC: Recent Labs    03/28/23 0810 03/29/23 0407 04/27/23 0530 04/28/23 0530 05/01/23 0703 05/03/23 0900 05/06/23 0918  HGB  --    < > 7.7* 7.8* 7.9* 8.3* 8.0*  MCV  --    < > 86.1 86.6 88.1 89.6 91.4  VITAMINB12 305  --   --   --   --   --   --  FERRITIN  --   --   --   --   --  179  --   TIBC  --   --   --   --   --  190*  --   IRON  --   --   --   --   --  43*  --    < > = values in this interval not displayed.    Cardiac Enzymes: No results for input(s): "CKTOTAL", "CKMB", "CKMBINDEX", "TROPONINI" in the last 168 hours.  CBG: Recent Labs  Lab 05/08/23 1233 05/08/23 1806 05/09/23 0000 05/09/23 0559 05/09/23 0737  GLUCAP 226* 175* 174* 78 74    Iron Studies:  No results for input(s): "IRON", "TIBC", "TRANSFERRIN", "FERRITIN" in the last 72 hours.  Studies/Results: No results found.    Medications: Infusions:    Scheduled Medications:  allopurinol  50 mg Oral Daily   amiodarone  200 mg Oral BID   apixaban  5 mg Oral BID   atorvastatin  40 mg Oral Daily   Chlorhexidine Gluconate Cloth  6 each Topical  Q0600   clopidogrel  75 mg Oral Q breakfast   darbepoetin (ARANESP) injection - DIALYSIS  200 mcg Subcutaneous Q Fri-1800   docusate sodium  100 mg Oral BID   DULoxetine  60 mg Oral Daily   ezetimibe  10 mg Oral Daily   gabapentin  300 mg Oral QHS   Gerhardt's butt cream   Topical TID   guaiFENesin  600 mg Oral BID   insulin aspart  0-9 Units Subcutaneous Q6H   insulin glargine  28 Units Subcutaneous BID   levothyroxine  88 mcg Oral QAC breakfast   midodrine  5 mg Oral TID WC   polyethylene glycol  17 g Oral Daily   sodium chloride flush  3 mL Intravenous Q12H   sodium chloride flush  3-10 mL Intravenous Q12H   tamsulosin  0.4 mg Oral Daily    have reviewed scheduled and prn medications.  Physical Exam: General:NAD, comfortable Heart:RRR, s1s2 nl Lungs: Reduced breath sound bilateral basal Abdomen:soft, Non-tender, distended Extremities:no edema  Dialysis Access: [facility] TDC in place  Alexis Mizuno W 05/09/2023,8:05 AM  LOS: 43 days

## 2023-05-09 NOTE — Progress Notes (Signed)
 Contacted by attending to inquire if pt can start at clinic next week if pt d/c this weekend to home. Contacted [facility] and advised pt can start on Tuesday if d/c this weekend. Pt's has a TTS 10:10 chair time and will need to arrive at 9:30 am for first appt to complete paperwork prior to treatment. This info has been provided to pt and pt's spouse. HD appts added to AVS as well. Attending/team aware pt can start at clinic on Tuesday. Contacted renal PA regarding clinic's need for orders at d/c. Will assist as needed.   Olivia Canter Renal Navigator [phone]

## 2023-05-09 NOTE — Care Management Important Message (Signed)
 Important Message  Patient Details  Name: Harold [name] MRN: [id number] Date of Birth: May 01, 1955   Important Message Given:  Yes - Medicare IM     Renie Ora 05/09/2023, 10:41 AM

## 2023-05-09 NOTE — Progress Notes (Signed)
 Physical Therapy Treatment Patient Details Name: Harold [name] MRN: [id number] DOB: 10-09-1955 Today's Date: 05/09/2023   History of Present Illness 68 y.o. male presents to [facility] 03/27/23 with productive cough and generalized weakness. Admitted with influenza A and cellulitis of R foot s/p hardware removal of the R ankle and application of wound vac on 2/10, wound vac removed 2/27. Left thigh pain, CT negative. Prior admit 11/24 for R ankle ORIF and in 12/24 for revision. S/p excisional debridement of right ankle on 2/12. Pt underwent central line placement on 04/07/2023 however he has been having issues with recurrent bleeding from the insertion site which has finally stopped. 04/17/23 pt with drop in sats requiring 2 L of O2 via [place], chest xray concerning for edema or possibly pneumonia.Significant PMH: HTN, HLD, CAD s/p PCI, COPD, DM2, CKD stage IIIb, PVD, gout, OSA, neuropathy, COPD, a-fib, CAD s/p percutaneous coronary angioplasty, angina, obesity    PT Comments  STAR PT/OT Session: Pt continues to have symptomatic orthostatic hypotension (see OT note), pt better able to recognize it and sit when he needs to. PT/OT recommend use of abdominal binder for improved BP with ambulation. Pt able to simulate use of his bathroom today prior to walking in the hallway with close chair follow and RW. Unfortunately, LTACH referral denied and pt to return home tomorrow. PT recommend pt work on his ambulation but have spouse follow with wheelchair.    If plan is discharge home, recommend the following: A lot of help with walking and/or transfers;A lot of help with bathing/dressing/bathroom;Assistance with cooking/housework;Assist for transportation;Help with stairs or ramp for entrance   Can travel by private vehicle     Yes  Equipment Recommendations  None recommended by PT    Recommendations for Other Services       Precautions / Restrictions Precautions Precautions: Fall Recall of  Precautions/Restrictions: Impaired Precaution/Restrictions Comments: Watch BP Required Braces or Orthoses: Other Brace Other Brace: abdominal binder (Prn) Restrictions Weight Bearing Restrictions Per Provider Order: Yes RLE Weight Bearing Per Provider Order: Weight bearing as tolerated Other Position/Activity Restrictions: per chat with Dr Lajoyce Corners, 3/12 pt no longer needs CAM boot per Dr. Audrie Lia note 04/03/2023 and orders 04/03/2023 pt WBAT     Mobility  Bed Mobility Overal bed mobility: Needs Assistance             General bed mobility comments: OOB in recliner    Transfers Overall transfer level: Needs assistance Equipment used: Rolling walker (2 wheels) Transfers: Sit to/from Stand Sit to Stand: Contact guard assist           General transfer comment: CGA for sit to stands and min/CGA for transfers with RW    Ambulation/Gait Ambulation/Gait assistance: Min assist, +2 safety/equipment (close chair follow) Gait Distance (Feet): 75 Feet (x2) Assistive device: Rolling walker (2 wheels) Gait Pattern/deviations: Step-through pattern, Trunk flexed, Decreased step length - right, Decreased step length - left, Shuffle, Knees buckling Gait velocity: reduced Gait velocity interpretation: <1.31 ft/sec, indicative of household ambulator   General Gait Details: CGA to  MinA for safety with fatigue, improved distance and steadyiness, which decreases with distance         Balance Overall balance assessment: Needs assistance Sitting-balance support: No upper extremity supported, Feet supported Sitting balance-Leahy Scale: Fair   Postural control: Posterior lean Standing balance support: Bilateral upper extremity supported, During functional activity, Reliant on assistive device for balance Standing balance-Leahy Scale: Poor Standing balance comment: reliant on UE support  Communication Communication Communication: No apparent  difficulties  Cognition Arousal: Alert Behavior During Therapy: Flat affect                             Following commands: Intact Following commands impaired: Follows one step commands with increased time    Cueing Cueing Techniques: Verbal cues, Tactile cues  Exercises General Exercises - Upper Extremity Elbow Flexion: Bar weights/barbell, AROM, 15 reps Bar Weights/Barbell (Elbow Flexion): 4 lbs Elbow Extension: AROM, Both, Bar weights/barbell, 15 reps Bar Weights/Barbell (Elbow Extension): 4 lbs    General Comments General comments (skin integrity, edema, etc.): See OT note for BP measurements      Pertinent Vitals/Pain Pain Assessment Pain Assessment: Faces Faces Pain Scale: Hurts a little bit Pain Location: R foot Pain Descriptors / Indicators: Discomfort, Grimacing Pain Intervention(s): Limited activity within patient's tolerance, Monitored during session, Repositioned     PT Goals (current goals can now be found in the care plan section) Acute Rehab PT Goals PT Goal Formulation: With patient Time For Goal Achievement: 05/22/23 Potential to Achieve Goals: Fair Progress towards PT goals: Progressing toward goals    Frequency    Min 1X/week       Co-evaluation PT/OT/SLP Co-Evaluation/Treatment: Yes Reason for Co-Treatment: For patient/therapist safety;To address functional/ADL transfers PT goals addressed during session: Mobility/safety with mobility;Proper use of DME;Balance OT goals addressed during session: ADL's and self-care;Strengthening/ROM      AM-PAC PT "6 Clicks" Mobility   Outcome Measure  Help needed turning from your back to your side while in a flat bed without using bedrails?: A Little Help needed moving from lying on your back to sitting on the side of a flat bed without using bedrails?: A Little Help needed moving to and from a bed to a chair (including a wheelchair)?: A Little Help needed standing up from a chair using your  arms (e.g., wheelchair or bedside chair)?: A Lot Help needed to walk in hospital room?: Total Help needed climbing 3-5 steps with a railing? : Total 6 Click Score: 13    End of Session Equipment Utilized During Treatment: Gait belt Activity Tolerance: Patient limited by fatigue Patient left: in chair;with call bell/phone within reach;with chair alarm set Nurse Communication: Mobility status PT Visit Diagnosis: Other abnormalities of gait and mobility (R26.89);Unsteadiness on feet (R26.81);Muscle weakness (generalized) (M62.81);History of falling (Z91.81);Difficulty in walking, not elsewhere classified (R26.2)     Time: 4010-2725 PT Time Calculation (min) (ACUTE ONLY): 27 min  Charges:    $Gait Training: 8-22 mins PT General Charges $$ ACUTE PT VISIT: 1 Visit                     Kristena Wilhelmi B. Beverely Risen PT, DPT Acute Rehabilitation Services Please use secure chat or  Call Office [phone]    Elon Alas [facility] 05/09/2023, 4:25 PM

## 2023-05-09 NOTE — Progress Notes (Signed)
 Mobility Specialist Progress Note:   05/09/23 1530  Mobility  Activity Transferred from chair to bed  Level of Assistance Minimal assist, patient does 75% or more  Assistive Device Front wheel walker  Distance Ambulated (ft) 3 ft  RLE Weight Bearing Per Provider Order WBAT  Activity Response Tolerated well  Mobility Referral Yes  Mobility visit 1 Mobility  Mobility Specialist Start Time (ACUTE ONLY) 1530  Mobility Specialist Stop Time (ACUTE ONLY) 1540  Mobility Specialist Time Calculation (min) (ACUTE ONLY) 10 min   Pt received in chair, agreeable to transfer back to chair d/t back pain. Required MinA+2 to stand with RW. Tolerated well, asx throughout. Pt lying in bed with all needs met, guest at bedside. Call bell in reach.   Feliciana Rossetti Mobility Specialist Please contact via Special educational needs teacher or  Rehab office at [phone]

## 2023-05-09 NOTE — TOC Transition Note (Addendum)
 Transition of Care [facility]) - Discharge Note   Patient Details  Name: Harold [name] MRN: [id number] Date of Birth: 1955/02/27  Transition of [facility]) CM/SW Contact:  Gala Lewandowsky, RN Phone Number: 05/09/2023, 3:12 PM   Clinical Narrative: Expedited appeal for LTAC has been denied. Patient and spouse are aware. Plan will be to transition home over the weekend with [facility] RN,PT,OT, Aide- [facility] is aware. Patient has been Clipped and has outpatient HD arranged. Patient has chair time on Tuesday with [facility]. Spouse states he will have assistance over the weekend to get the patient home safely and MD is aware. No further home needs identified at this time.    Final next level of care: Home w Home Health Services Barriers to Discharge: No Barriers Identified   Patient Goals and CMS Choice Patient states their goals for this hospitalization and ongoing recovery are:: plan to return home with home health CMS Medicare.gov Compare Post Acute Care list provided to:: Patient Choice offered to / list presented to : Patient  Discharge Plan and Services Additional resources added to the After Visit Summary for   In-house Referral: Clinical Social Work Discharge Planning Services: CM Consult Post Acute Care Choice: Home Health          DME Arranged: Bedside commode (bariatric) DME Agency:  Julianne Rice) Date DME Agency Contacted: 04/29/23 Time DME Agency Contacted: [phone] Representative spoke with at DME Agency: Vaughan Basta HH Arranged: RN, Disease Management, PT, OT, Nurse's Aide HH Agency: [facility] ([facility])  Social Drivers of Health (SDOH) Interventions SDOH Screenings   Food Insecurity: No Food Insecurity (03/27/2023)  Housing: Low Risk  (03/28/2023)  Transportation Needs: No Transportation Needs (03/27/2023)  Utilities: Not At Risk (03/28/2023)  Social Connections: Unknown (03/28/2023)  Tobacco Use: Medium Risk (04/02/2023)   Readmission Risk Interventions     No  data to display

## 2023-05-09 NOTE — Progress Notes (Signed)
 PROGRESS NOTE    Harold [name]  VWU:[id number] DOB: 25-Nov-1955 DOA: 03/27/2023 PCP: [name], MD   Brief Narrative:  Patient is a 68 year old male with morbid obesity, OSA, DM2, HTN, HLD, CAD/stent, A-fib on Eliquis, carotid artery stenosis, CKD, chronic anemia, anxiety/depression, diabetic neuropathy, GERD, hypothyroidism, COPD, pulmonary hypertension.  Patient presented with fever, cough, myalgia and generalized weakness and was found to be positive for influenza A.  Chest x-ray was negative for infiltrates.  Workup done during the hospital stay revealed right lower extremity wound infection/osteomyelitis of right ankle status post removal of hardware.  Cultures grew Enterobacter cloacae and MRSE.  Right lower extremity wound is healing well.  Antibiotics were managed by ID.  Patient monitored off antibiotics for a week, ID eventually signed off on 04/29/2023.  Apparently, patient developed worsening renal function on antibiotics.  Patient is on intermittent dialysis for severe and dense ATN.  Nephrology input is appreciated.   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 -Chest x-ray did not reveal any infiltrates.   -Improved with symptomatic treatment.  Patient has been on room air for very long time.   Right leg wound infection/osteomyelitis  right ankle s/p hardware removal Enterobacter Cloacea, MRSE  -Prior right ankle surgery (12/2022) complicated by postsurgical wound infection. -S/p right ankle hardware removal, I&D and wound VAC placement on 2/10 by Dr. Jena Gauss -S/p repeat debridement of the right ankle by Dr. Lajoyce Corners 04/02/2023.   -ID was consulted, initially on cefepime, subsequently on daptomycin and ertapenem, eventually antibiotics held due to worsening renal function.  Patient doing well without further signs of infection so ID eventually signed off on 04/29/2023.  No plan of antibiotics.   AKI on CKD stage IIIb / Nephrotic range proteinuria, uremia, Anion gap metabolic acidosis: -Baseline creatinine appears to be 1.9-2.2.   -Renal function has continued to progressively worsen -Nephrology following, renal biopsy deferred due to high risk of bleeding.  Patient was also on Plavix for recent stent for PAD  -TDC placed on 3/4, underwent first HD session and now is receiving intermittent dialysis.  Appreciate nephrology help.  Plan for him to start outpatient dialysis at [facility] eventually when discharged from rehab facility/LTAC.   Right facial droop -On 3/1 patient noted to have right facial droop, evaluated by neurology, MRI of the brain negative for acute stroke. -Currently on Plavix.   PAD -Seen by vascular surgery underwent aortogram, right leg angiogram, successful stenting of right superficial femoral artery on 04/01/2023.   -Continue Plavix, Zetia   Acute metabolic encephalopathy -Likely multifactorial, uremia, infection, drug-induced from possibility of cefepime induced encephalopathy, chronic worsening memory issues -Started on hemodialysis Continue Cymbalta, decreased Neurontin 04/26/2023: Encephalopathy resolved.   Type 2 diabetes mellitus / Diabetic neuropathy, POA: A1c 7 on 01/02/2023. PTA meds-Tresiba 60 units daily, Mounjaro.  Patient  currently on Semglee 28 units twice daily and SSI, blood sugar  better controlled.   Diabetic neuropathy -Gabapentin decreased due to worsening renal function. (PTA reportedly on gabapentin 1600 mg 3 times daily).  Currently on Cymbalta.   Hypokalemia/hypomagnesemia: resolved.   Paroxysmal A-fib with slow ventricular response: -HR noted to be in low 40s, cardiology consulted, Coreg held, and they recommended continuing amiodarone.  Heart rate has improved but still in bradycardia range in 50s.   -CHADS-VASc of 4.     Essential hypertension/orthostatic hypotension Coreg discontinued by cardiology blood pressure started rising, started on amlodipine 5 mg but then patient had orthostatic hypotension so amlodipine was discontinued.  This has been an issue in the past as well. -2D echo showed EF 55 to 60%, basal inferior hypokinesis, G2 DD.  Continue abdominal binder. started on midodrine 05/07/2023.   Anemia of chronic disease: Baseline hemoglobin between 10.5 11.5.   -H&H stable, borderline   CAD/stent Carotid artery stenosis HLD Continue PTA meds -Plavix, Crestor and Eliquis.   Hypothyroidism Continue Synthroid   Obstructive sleep apnea CPAP at bedtime    Gout Continue allopurinol, colchicine   Generalized debility -PT had recommended SNF however patient will have to pay out-of-pocket which he is not able to afford.   Dizziness/vertigo/double vision: Intermittent complaints of dizziness.  MRI brain completed on 04/19/2023 was unremarkable.  Low blood pressure is considered to be the cause.  Bradycardia was also suspected as one of the cause, Coreg has been discontinued and his heart rate is improving.  Symptoms have now resolved.   Morbid obesity: Estimated body mass index is 40.14 kg/m as calculated from the following:   Height as of this encounter: 6\' 1"  (1.854 m).   Weight as of this encounter: 138 kg. Weight loss and diet modification counseled.  Disposition: Patient was medically ready for discharge 04/30/2023 however patient's spouse  [name], who is the only other member living in the home was unavailable.  Per TOC, [name] will return in town on Friday.  However PT said "We need to practice wheelchair transfers with spouse which will likely not happen until late Friday. To set him up for success, it would be better if he could stay for dialysis on Saturday and discharge home from dialysis on Saturday" and based on their recommendations, we  planned for him to be discharged on Saturday/05/03/2023 after dialysis. However when PT/OT worked with him again on 05/02/2023, they were trying to simulate a normal day, getting dressed, getting into the wheelchair and walking in the hallway. After walking 25 feet pt rested in wheelchair for 10 minutes after attempting to stand x2 BP dropped to 82/40, pt had tremors in bilateral UE and was slurring his speech. This is with abdominal binder in place and TED hose on L leg.  PT did not think that patient can safely be discharged home.  We are pursuing LTAC at the moment.  P2P was requested by [employer].  I spoke to the medical director Dr. Joseph Art, who denied approval.  PT/OT, TOC and LTAC team was informed.  PT worked with him again and once again he became hypotensive, Given pt's continued drop in BP with positional change, PT continues to recommend continued therapy at [facility]. Discharge home pt jeopardizes not only his safety but that of his significant other.  Appeal in process.  DVT prophylaxis: Place TED hose Start: 05/01/23 1621 Place and maintain sequential compression device Start: 04/15/23 1549 SCDs Start: 04/02/23 1617   Code Status: Full  Code  Family Communication:  None present at bedside.  Plan of care discussed with patient in length and he/she verbalized understanding and agreed with it.  Status is: Inpatient Remains inpatient appropriate because: Please see above under disposition   Estimated body mass index is 35.98 kg/m as calculated from the following:   Height as of this  encounter: 6\' 1"  (1.854 m).   Weight as of this encounter: 123.7 kg.  Pressure Injury 04/17/23 Ankle Right;Anterior Deep Tissue Pressure Injury - Purple or maroon localized area of discolored intact skin or blood-filled blister due to damage of underlying soft tissue from pressure and/or shear. (Active)  04/17/23 2040  Location: Ankle  Location Orientation: Right;Anterior  Staging: Deep Tissue Pressure Injury - Purple or maroon localized area of discolored intact skin or blood-filled blister due to damage of underlying soft tissue from pressure and/or shear.  Wound Description (Comments):   Present on Admission: Yes  Dressing Type Gauze (Comment) 05/07/23 2300   Nutritional Assessment: Body mass index is 35.98 kg/m.Marland Kitchen Seen by dietician.  I agree with the assessment and plan as outlined below: Nutrition Status:        . Skin Assessment: I have examined the patient's skin and I agree with the wound assessment as performed by the wound care RN as outlined below: Pressure Injury 04/17/23 Ankle Right;Anterior Deep Tissue Pressure Injury - Purple or maroon localized area of discolored intact skin or blood-filled blister due to damage of underlying soft tissue from pressure and/or shear. (Active)  04/17/23 2040  Location: Ankle  Location Orientation: Right;Anterior  Staging: Deep Tissue Pressure Injury - Purple or maroon localized area of discolored intact skin or blood-filled blister due to damage of underlying soft tissue from pressure and/or shear.  Wound Description (Comments):   Present on Admission: Yes  Dressing Type Gauze (Comment) 05/07/23 2300    Consultants:  Cardiology  Procedures:  As above   Objective: Vitals:   05/08/23 1200 05/08/23 1535 05/08/23 2012 05/09/23 0500  BP: (!) 120/52 (!) 151/64 (!) 132/57 (!) 161/65  Pulse: 62 60 (!) 58 60  Resp: 19 20 20 18   Temp: 98.2 F (36.8 C) 97.8 F (36.6 C) 98.5 F (36.9 C) 97.6 F (36.4 C)  TempSrc: Oral Axillary  Axillary Axillary  SpO2: 98% 97% 97% 99%  Weight:      Height:        Intake/Output Summary (Last 24 hours) at 05/09/2023 0738 Last data filed at 05/09/2023 0500 Gross per 24 hour  Intake 519 ml  Output 1860 ml  Net -1341 ml   Filed Weights   05/06/23 0915 05/06/23 1246 05/08/23 0500  Weight: 122.4 kg 122.4 kg 123.7 kg   Subjective: Seen and examined.  No complaints today.  Examination:  General exam: Appears calm and comfortable  Respiratory system: Clear to auscultation. Respiratory effort normal. Cardiovascular system: S1 & S2 heard, RRR. No JVD, murmurs, rubs, gallops or clicks. No pedal edema. Gastrointestinal system: Abdomen is nondistended, soft and nontender. No organomegaly or masses felt. Normal bowel sounds heard. Central nervous system: Alert and oriented. No focal neurological deficits. Extremities: Symmetric 5 x 5 power. Skin: No rashes, lesions or ulcers.  Psychiatry: Judgement and insight appear normal. Mood & affect appropriate.   Data Reviewed: I have personally reviewed following labs and imaging studies  CBC: Recent Labs  Lab 05/03/23 0900 05/06/23 0918  WBC 8.5 6.5  HGB 8.3* 8.0*  HCT 25.1* 24.3*  MCV 89.6 91.4  PLT 195 172  Basic Metabolic Panel: Recent Labs  Lab 05/05/23 0547 05/06/23 0625 05/07/23 0407 05/08/23 0511 05/09/23 0414  NA 136 137 136 135 138  K 4.3 4.2 4.6 4.0 3.9  CL 100 99 99 101 105  CO2 27 26 30 26 27   GLUCOSE 267* 169* 167* 193* 102*  BUN 34* 34* 22 28* 30*  CREATININE 4.68* 4.98* 3.71* 4.08* 4.23*  CALCIUM 8.5* 8.9 8.6* 8.2* 8.2*  PHOS 4.3 4.6 3.5 4.1 4.6   GFR: Estimated Creatinine Clearance: 22.7 mL/min (A) (by C-G formula based on SCr of 4.23 mg/dL (H)). Liver Function Tests: Recent Labs  Lab 05/05/23 0547 05/06/23 0625 05/07/23 0407 05/08/23 0511 05/09/23 0414  ALBUMIN 2.6* 2.7* 2.6* 2.6* 2.6*   No results for input(s): "LIPASE", "AMYLASE" in the last 168 hours. No results for input(s): "AMMONIA"  in the last 168 hours. Coagulation Profile: No results for input(s): "INR", "PROTIME" in the last 168 hours. Cardiac Enzymes: No results for input(s): "CKTOTAL", "CKMB", "CKMBINDEX", "TROPONINI" in the last 168 hours.  BNP (last 3 results) No results for input(s): "PROBNP" in the last 8760 hours. HbA1C: No results for input(s): "HGBA1C" in the last 72 hours. CBG: Recent Labs  Lab 05/08/23 1107 05/08/23 1233 05/08/23 1806 05/09/23 0000 05/09/23 0559  GLUCAP 160* 226* 175* 174* 78   Lipid Profile: No results for input(s): "CHOL", "HDL", "LDLCALC", "TRIG", "CHOLHDL", "LDLDIRECT" in the last 72 hours. Thyroid Function Tests: No results for input(s): "TSH", "T4TOTAL", "FREET4", "T3FREE", "THYROIDAB" in the last 72 hours. Anemia Panel: No results for input(s): "VITAMINB12", "FOLATE", "FERRITIN", "TIBC", "IRON", "RETICCTPCT" in the last 72 hours.  Sepsis Labs: No results for input(s): "PROCALCITON", "LATICACIDVEN" in the last 168 hours.  No results found for this or any previous visit (from the past 240 hours).   Radiology Studies: No results found.    Scheduled Meds:  allopurinol  50 mg Oral Daily   amiodarone  200 mg Oral BID   apixaban  5 mg Oral BID   atorvastatin  40 mg Oral Daily   Chlorhexidine Gluconate Cloth  6 each Topical Q0600   clopidogrel  75 mg Oral Q breakfast   darbepoetin (ARANESP) injection - DIALYSIS  200 mcg Subcutaneous Q Fri-1800   docusate sodium  100 mg Oral BID   DULoxetine  60 mg Oral Daily   ezetimibe  10 mg Oral Daily   gabapentin  300 mg Oral QHS   Gerhardt's butt cream   Topical TID   guaiFENesin  600 mg Oral BID   insulin aspart  0-9 Units Subcutaneous Q6H   insulin glargine  28 Units Subcutaneous BID   levothyroxine  88 mcg Oral QAC breakfast   midodrine  5 mg Oral TID WC   polyethylene glycol  17 g Oral Daily   sodium chloride flush  3 mL Intravenous Q12H   sodium chloride flush  3-10 mL Intravenous Q12H   tamsulosin  0.4 mg Oral  Daily   Continuous Infusions:   LOS: 43 days   Hughie Closs, MD [facility]  05/09/2023, 7:38 AM   *Please note that this is a verbal dictation therefore any spelling or grammatical errors are due to the "Dragon Medical One" system interpretation.  Please page via Amion and do not message via secure chat for urgent patient care matters. Secure chat can be used for non urgent patient care matters.  How to contact the [facility] Attending or Consulting provider 7A - 7P or covering provider during after hours 7P -7A, for this  patient?  Check the care team in [facility] and look for a) attending/consulting [facility] provider listed and b) the [facility] team listed. Page or secure chat 7A-7P. Log into [email or web address] and use [facility] universal password to access. If you do not have the password, please contact the hospital operator. Locate the [facility] provider you are looking for under [facility] and page to a number that you can be directly reached. If you still have difficulty reaching the provider, please page the [facility] (Director on Call) for the Hospitalists listed on amion for assistance.

## 2023-05-09 NOTE — Progress Notes (Signed)
 Mobility Specialist Progress Note;   05/09/23 1020  Mobility  Activity Transferred from bed to chair  Level of Assistance +2 (takes two people) (light MinA)  Assistive Device Front wheel walker  Distance Ambulated (ft) 5 ft  RLE Weight Bearing Per Provider Order WBAT  Activity Response Tolerated well  Mobility Referral Yes  Mobility visit 1 Mobility  Mobility Specialist Start Time (ACUTE ONLY) 1020  Mobility Specialist Stop Time (ACUTE ONLY) 1029  Mobility Specialist Time Calculation (min) (ACUTE ONLY) 9 min   Secretary stated pt was requesting assistance to chair. Required light MinA +2 to safely transfer pt from bed to chair. No c/o lightheadedness when asked. Pt left comfortably in chair with all needs met.   Caesar Bookman Mobility Specialist Please contact via SecureChat or [employer] [phone]

## 2023-05-10 ENCOUNTER — Other Ambulatory Visit (HOSPITAL_COMMUNITY): Payer: Self-pay

## 2023-05-10 DIAGNOSIS — L02415 Cutaneous abscess of right lower limb: Secondary | ICD-10-CM | POA: Diagnosis not present

## 2023-05-10 LAB — GLUCOSE, CAPILLARY
Glucose-Capillary: 100 mg/dL — ABNORMAL HIGH (ref 70–99)
Glucose-Capillary: 145 mg/dL — ABNORMAL HIGH (ref 70–99)
Glucose-Capillary: 70 mg/dL (ref 70–99)
Glucose-Capillary: 70 mg/dL (ref 70–99)
Glucose-Capillary: 71 mg/dL (ref 70–99)

## 2023-05-10 LAB — RENAL FUNCTION PANEL
Albumin: 2.6 g/dL — ABNORMAL LOW (ref 3.5–5.0)
Anion gap: 13 (ref 5–15)
BUN: 30 mg/dL — ABNORMAL HIGH (ref 8–23)
CO2: 25 mmol/L (ref 22–32)
Calcium: 8.8 mg/dL — ABNORMAL LOW (ref 8.9–10.3)
Chloride: 102 mmol/L (ref 98–111)
Creatinine, Ser: 4.07 mg/dL — ABNORMAL HIGH (ref 0.61–1.24)
GFR, Estimated: 15 mL/min — ABNORMAL LOW (ref >60)
Glucose, Bld: 99 mg/dL (ref 70–99)
Phosphorus: 4.6 mg/dL (ref 2.5–4.6)
Potassium: 4.6 mmol/L (ref 3.5–5.1)
Sodium: 140 mmol/L (ref 135–145)

## 2023-05-10 MED ORDER — TRESIBA FLEXTOUCH 200 UNIT/ML ~~LOC~~ SOPN
28.0000 [IU] | PEN_INJECTOR | Freq: Every day | SUBCUTANEOUS | 0 refills | Status: DC
  Filled 2023-05-10: qty 4.2, 30d supply, fill #0

## 2023-05-10 MED ORDER — AMIODARONE HCL 200 MG PO TABS
200.0000 mg | ORAL_TABLET | Freq: Two times a day (BID) | ORAL | 0 refills | Status: DC
  Filled 2023-05-10: qty 60, 30d supply, fill #0

## 2023-05-10 MED ORDER — MIDODRINE HCL 5 MG PO TABS
5.0000 mg | ORAL_TABLET | Freq: Three times a day (TID) | ORAL | 0 refills | Status: DC
  Filled 2023-05-10: qty 90, 30d supply, fill #0

## 2023-05-10 MED ORDER — TRESIBA FLEXTOUCH 200 UNIT/ML ~~LOC~~ SOPN
28.0000 [IU] | PEN_INJECTOR | Freq: Two times a day (BID) | SUBCUTANEOUS | 0 refills | Status: DC
  Filled 2023-05-10: qty 8.4, 30d supply, fill #0

## 2023-05-10 MED ORDER — ATORVASTATIN CALCIUM 40 MG PO TABS
40.0000 mg | ORAL_TABLET | Freq: Every day | ORAL | 0 refills | Status: DC
  Filled 2023-05-10: qty 30, 30d supply, fill #0

## 2023-05-10 MED ORDER — TAMSULOSIN HCL 0.4 MG PO CAPS
0.4000 mg | ORAL_CAPSULE | Freq: Every day | ORAL | 0 refills | Status: AC
  Filled 2023-05-10: qty 30, 30d supply, fill #0

## 2023-05-10 MED ORDER — GABAPENTIN 300 MG PO CAPS
300.0000 mg | ORAL_CAPSULE | Freq: Every day | ORAL | 0 refills | Status: DC
  Filled 2023-05-10: qty 30, 30d supply, fill #0

## 2023-05-10 MED ORDER — ALLOPURINOL 100 MG PO TABS
50.0000 mg | ORAL_TABLET | Freq: Every day | ORAL | 0 refills | Status: DC
  Filled 2023-05-10: qty 15, 30d supply, fill #0

## 2023-05-10 NOTE — Progress Notes (Signed)
 Contacted clinic staff at [facility] and updated that patient appears to be recovering kidney function and will not be starting dialysis at this time  Megan Mans Chanan Detwiler PA-C 05/10/2023,11:45 AM

## 2023-05-10 NOTE — Progress Notes (Signed)
 HD cath site unremarkable, dressing cdi. Ccmd notified of dc order. Patient denies pain or sob. Patient verbalized understanding of dc instructions. He also verbalized wound care instructions. Supplies provided  to patient.

## 2023-05-10 NOTE — Progress Notes (Signed)
 Harold [name]  Assessment/ Plan: Dialysis on Saturday Pt is a 68 y.o. yo male    # Dialysis dependent AKI on CKD 3; ATN and AIN both possible. -CLIP as AKI to [place] start 3/11 if needed. -  He is nonoliguric - BUN and crt rising more slowly in between tx.   Dialysis on Saturday;  tolerated HD on 3/18 and continue to watch for signs of recovery. UOP dropped to -> 1860 -> 6109mL/24.  Last dialysis treatment was on 3/18, very stable and UOP fluctuatues.  I am going to cancel the CLIP to [facility] for AKI; will have office set up a Bmet in 1 week and also f/u appt in 1-2 weeks. I believe he's starting to recover function and his BUN/Cr have been v ery stable in the 28-30/4.07-4.23 range with no uremic symptoms. BL cr appears to be in the 2-2.6 range.  Restart Lasix at 40mg  daily (or 80mg  1/2 tab).  Will also have HD nurse repack the TC today; we can pull the TC in 1-2 weeks once we determine he won't need further dialysis.  # Hypertension with tendency towards orthostasis on low-dose carvedilol and tamsulosin. Ease off on UF as seems euvolemic and is nonoliguric  # Nephrotic range proteinuria with negative serological evaluation but decision against biopsy at the current time per previous notes.  # Right lower extremity infection with involvement of orthopedic hardware status post removal and antibiotics discontinued 2/28 with concern for AIN.  #Mild hyponatremia, continue to limit free water intake.  Corrected with more UF with HD  # Increased anion gap metabolic acidosis, managed with dialysis.  # Anemia-   iron ok  and darbo 200 qwk (fri) last given 3/14  # Dispo-  now looking into LTAC; need to increase stamina and strength prior to discharge   Subjective:   no new issues -  last dialysis was on the 18th. Going home from hospital. Denies f/c/n/v/sob.  Objective Vital signs in last 24 hours: Vitals:   05/09/23 0738 05/09/23 1408 05/09/23  1939 05/10/23 0446  BP: (!) 161/64 (!) 126/57 (!) 140/59 (!) 143/57  Pulse: 61 63 60 (!) 51  Resp: 17 16 18 18   Temp: 97.8 F (36.6 C)  98.2 F (36.8 C) 97.6 F (36.4 C)  TempSrc: Axillary  Oral Oral  SpO2: 96%   99%  Weight:    126 kg  Height:       Weight change:   Intake/Output Summary (Last 24 hours) at 05/10/2023 0841 Last data filed at 05/09/2023 2216 Gross per 24 hour  Intake 720 ml  Output 600 ml  Net 120 ml       Labs: RENAL PANEL Recent Labs  Lab 05/06/23 0625 05/07/23 0407 05/08/23 0511 05/09/23 0414 05/10/23 0358  NA 137 136 135 138 140  K 4.2 4.6 4.0 3.9 4.6  CL 99 99 101 105 102  CO2 26 30 26 27 25   GLUCOSE 169* 167* 193* 102* 99  BUN 34* 22 28* 30* 30*  CREATININE 4.98* 3.71* 4.08* 4.23* 4.07*  CALCIUM 8.9 8.6* 8.2* 8.2* 8.8*  PHOS 4.6 3.5 4.1 4.6 4.6  ALBUMIN 2.7* 2.6* 2.6* 2.6* 2.6*    Liver Function Tests: Recent Labs  Lab 05/08/23 0511 05/09/23 0414 05/10/23 0358  ALBUMIN 2.6* 2.6* 2.6*   No results for input(s): "LIPASE", "AMYLASE" in the last 168 hours. No results for input(s): "AMMONIA" in the last 168 hours. CBC: Recent Labs  03/28/23 0810 03/29/23 0407 04/27/23 0530 04/28/23 0530 05/01/23 0703 05/03/23 0900 05/06/23 0918  HGB  --    < > 7.7* 7.8* 7.9* 8.3* 8.0*  MCV  --    < > 86.1 86.6 88.1 89.6 91.4  VITAMINB12 305  --   --   --   --   --   --   FERRITIN  --   --   --   --   --  179  --   TIBC  --   --   --   --   --  190*  --   IRON  --   --   --   --   --  43*  --    < > = values in this interval not displayed.    Cardiac Enzymes: No results for input(s): "CKTOTAL", "CKMB", "CKMBINDEX", "TROPONINI" in the last 168 hours.  CBG: Recent Labs  Lab 05/09/23 2359 05/10/23 0456 05/10/23 0754 05/10/23 0811 05/10/23 0837  GLUCAP 145* 71 70 70 100*    Iron Studies:  No results for input(s): "IRON", "TIBC", "TRANSFERRIN", "FERRITIN" in the last 72 hours.  Studies/Results: No results  found.    Medications: Infusions:    Scheduled Medications:  allopurinol  50 mg Oral Daily   amiodarone  200 mg Oral BID   apixaban  5 mg Oral BID   atorvastatin  40 mg Oral Daily   Chlorhexidine Gluconate Cloth  6 each Topical Q0600   clopidogrel  75 mg Oral Q breakfast   darbepoetin (ARANESP) injection - DIALYSIS  200 mcg Subcutaneous Q Fri-1800   docusate sodium  100 mg Oral BID   DULoxetine  60 mg Oral Daily   ezetimibe  10 mg Oral Daily   gabapentin  300 mg Oral QHS   Gerhardt's butt cream   Topical TID   guaiFENesin  600 mg Oral BID   insulin aspart  0-9 Units Subcutaneous Q6H   insulin glargine  28 Units Subcutaneous BID   levothyroxine  88 mcg Oral QAC breakfast   midodrine  5 mg Oral TID WC   polyethylene glycol  17 g Oral Daily   sodium chloride flush  3 mL Intravenous Q12H   sodium chloride flush  3-10 mL Intravenous Q12H   tamsulosin  0.4 mg Oral Daily    have reviewed scheduled and prn medications.  Physical Exam: General:NAD, comfortable Heart:RRR, s1s2 nl Lungs: Reduced breath sound bilateral basal Abdomen:soft, Non-tender, distended Extremities:no edema  Dialysis Access: LIJ TDC in place  Jarvis Sawa W 05/10/2023,8:41 AM  LOS: 44 days

## 2023-05-10 NOTE — Plan of Care (Signed)
  Problem: Clinical Measurements: Goal: Ability to maintain clinical measurements within normal limits will improve Outcome: Progressing Goal: Will remain free from infection Outcome: Progressing Goal: Diagnostic test results will improve Outcome: Progressing Goal: Cardiovascular complication will be avoided Outcome: Progressing   Problem: Activity: Goal: Risk for activity intolerance will decrease Outcome: Progressing   Problem: Elimination: Goal: Will not experience complications related to bowel motility Outcome: Progressing   Problem: Pain Managment: Goal: General experience of comfort will improve and/or be controlled Outcome: Progressing

## 2023-05-10 NOTE — Discharge Summary (Signed)
 Physician Discharge Summary  Harold [name] UYQ:[id number] DOB: Jun 28, 1955 DOA: 03/27/2023  PCP: [name], MD  Admit date: 03/27/2023 Discharge date: 05/10/2023 30 Day Unplanned Readmission Risk Score    Flowsheet Row ED to Hosp-Admission (Current) from 03/27/2023 in [facility]  30 Day Unplanned Readmission Risk Score (%) 38.69 Filed at 05/10/2023 0800       This score is the patient's risk of an unplanned readmission within 30 days of being discharged (0 -100%). The score is based on dignosis, age, lab data, medications, orders, and past utilization.   Low:  0-14.9   Medium: 15-21.9   High: 22-29.9   Extreme: 30 and above          Admitted From: Home Disposition: Home  Recommendations for Outpatient Follow-up:  Follow up with PCP in 1-2 weeks Please obtain BMP/CBC in one week Follow-up with vascular surgery in 2 to 4 weeks please call their office. Follow-up with nephrology and outpatient dialysis starting Tuesday. Please follow up with your PCP on the following pending results: Unresulted Labs (From admission, onward)     Start     Ordered   05/05/23 0500  Renal function panel  Daily,   R     Question:  Specimen collection method  Answer:  Unit=Unit collect   05/04/23 0942   Signed and Held  Renal function panel  Once,   R       Question:  Specimen collection method  Answer:  Unit=Unit collect   Signed and Held   Signed and Held  CBC  Once,   R       Question:  Specimen collection method  Answer:  Unit=Unit collect   Signed and Held   Signed and Held  Renal function panel  Once,   R       Question:  Specimen collection method  Answer:  Unit=Unit collect   Signed and Held   Signed and Held  CBC  Once,   R       Question:  Specimen collection method  Answer:  Unit=Unit collect   Signed and Held   Signed and Held  Renal function panel  Once,   R       Question:  Specimen collection method  Answer:  Lab=Lab collect   Signed and Held               Home Health: Yes Equipment/Devices: None  Discharge Condition: Stable CODE STATUS: Full code Diet recommendation: Low-sodium  Subjective: Seen and examined.  No complaints.  He is happy that he is finally getting discharged from the hospital.  Brief/Interim Summary: Patient is a 68 year old male with morbid obesity, OSA, DM2, HTN, HLD, CAD/stent, A-fib on Eliquis, carotid artery stenosis, CKD, chronic anemia, anxiety/depression, diabetic neuropathy, GERD, hypothyroidism, COPD, pulmonary hypertension.  Patient presented with fever, cough, myalgia and generalized weakness and was found to be positive for influenza A.  Chest x-ray was negative for infiltrates.  Workup done during the hospital stay revealed right lower extremity wound infection/osteomyelitis of right ankle status post removal of hardware.  Cultures grew Enterobacter cloacae and MRSE.  Right lower extremity wound is healing well.  Antibiotics were managed by ID.  Patient monitored off antibiotics for a week, ID eventually signed off on 04/29/2023.  Apparently, patient developed worsening renal function on antibiotics.  Patient started on intermittent dialysis.  Please note that this patient has remained in the hospital for 44 days.  I have put the best  effort I can to complete a thorough discharge summary with details below.  Acute respiratory failure with hypoxia secondary to Influenza A, POA: -Originally admitted for influenza A on 03-27-2023. -Chest x-ray did not reveal any infiltrates.   -Improved with symptomatic treatment.  Patient has been on room air for very long time.   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 -S/p right ankle hardware removal, I&D and wound VAC placement on 2/10 by Dr. Jena Gauss -S/p repeat debridement of the right ankle by Dr. Lajoyce Corners 04/02/2023.   -ID was consulted,  initially on cefepime, subsequently on daptomycin and ertapenem, eventually antibiotics held due to worsening renal function.  Patient doing well without further signs of infection so ID eventually signed off on 04/29/2023.  They recommended no further antibiotics.   AKI on CKD stage IIIb / Nephrotic range proteinuria, uremia, Anion gap metabolic acidosis: -Baseline creatinine appears to be 1.9-2.2.   -Renal fx continued to progressively worsen -Nephrology following, renal biopsy deferred due to high risk of bleeding.  Patient was also on Plavix for recent stent for PAD  -TDC placed on 3/4, underwent first HD session and now is receiving intermittent dialysis.  Appreciate nephrology help.  Plan for him to start outpatient dialysis at [facility] Tuesday however nephrology believes that he may not need any dialysis and nephrologist Dr. Juel Burrow is going to communicate with his team and outpatient center.    Right facial droop -On 3/1 patient noted to have right facial droop, evaluated by neurology, MRI of the brain negative for acute stroke. -Currently on Plavix.   PAD -Seen by vascular surgery underwent aortogram, right leg angiogram, successful stenting of right superficial femoral artery on 04/01/2023.   -Continue Plavix, Zetia   Acute metabolic encephalopathy -Likely multifactorial, uremia, infection, drug-induced from possibility of cefepime induced encephalopathy, chronic worsening memory issues -Started on hemodialysis Continue Cymbalta, decreased Neurontin 04/26/2023: Encephalopathy resolved.   Type 2 diabetes mellitus / Diabetic neuropathy, POA: A1c 7 on 01/02/2023. PTA meds-Tresiba 60 units daily, Mounjaro.  Patient currently on Semglee 28 units twice daily and SSI, blood sugar better controlled.   Diabetic neuropathy -Gabapentin decreased due to worsening renal function.  Gabapentin   Hypokalemia/hypomagnesemia: resolved.   Paroxysmal A-fib with slow ventricular response: -HR  noted to be in low 40s, cardiology consulted, Coreg held, and they recommended continuing amiodarone.  Heart rate has improved but still in bradycardia range in 50s.   -CHADS-VASc of 4.  Please note that patient was on amiodarone 200 mg p.o. daily PTA but he has been on amiodarone 200 mg p.o. twice daily here with rates controlled.  He was last seen by cardiology/Dr. Herbie Baltimore during this hospitalization on 04/29/2023 and he recommended continuing current dose without any heparin.  Will defer to cardiology to adjust dosage as outpatient.   Essential hypertension/orthostatic hypotension Coreg discontinued by cardiology blood pressure started rising, started on amlodipine 5 mg but then patient had orthostatic hypotension so amlodipine was discontinued.  Coreg was also discontinued.  This has been an issue in the past as well. -2D echo showed EF 55 to 60%, basal inferior hypokinesis, G2 DD.  Continue abdominal binder. started on midodrine 05/07/2023.  Patient improving.   Anemia of chronic disease: Baseline hemoglobin between 10.5 11.5.   -H&H stable, borderline   CAD/stent Carotid artery stenosis HLD Continue PTA meds -Plavix, Crestor and Eliquis.   Hypothyroidism Continue Synthroid  Obstructive sleep apnea CPAP at bedtime    Gout Continue allopurinol, colchicine   Generalized debility -  Dizziness/vertigo/double vision: Intermittent complaints of dizziness.  MRI brain completed on 04/19/2023 was unremarkable.  Low blood pressure is considered to be the cause.  Bradycardia was also suspected as one of the cause, Coreg has been discontinued and his heart rate is improving.  Symptoms have now resolved.   Morbid obesity: Estimated body mass index is 40.14 kg/m as calculated from the following:   Height as of this encounter: 6\' 1"  (1.854 m).   Weight as of this encounter: 138 kg. Weight loss and diet modification counseled.   Generalized weakness, debility/disposition: PT had recommended SNF  however patient will have to pay out-of-pocket which he is not able to afford. Patient was medically ready for discharge 04/30/2023 however patient's spouse [name], who is the only other member living in the home was unavailable.  Per TOC, [name] will return in town on Friday.  However PT said "We need to practice wheelchair transfers with spouse which will likely not happen until late Friday. To set him up for success, it would be better if he could stay for dialysis on Saturday and discharge home from dialysis on Saturday" and based on their recommendations, we  planned for him to be discharged on Saturday/05/03/2023 after dialysis. However when PT/OT worked with him again on 05/02/2023, they were trying to simulate a normal day, getting dressed, getting into the wheelchair and walking in the hallway. After walking 25 feet pt rested in wheelchair for 10 minutes after attempting to stand x2 BP dropped to 82/40, pt had tremors in bilateral UE and was slurring his speech. This is with abdominal binder in place and TED hose on L leg.  PT did not think that patient can safely be discharged home.  We are pursuing LTAC at the moment.  P2P was requested by [employer].  I spoke to the medical director Dr. Joseph Art, who denied approval.  PT/OT, TOC and LTAC team was informed.  PT worked with him again and once again he became hypotensive, Given pt's continued drop in BP with positional change, PT continues to recommend continued [facility] at [facility]. Discharge home pt jeopardizes not only his safety but that of his significant other.  Family and LTAC appeal and appeal was denied as well.  In the meantime, PT continue to work with the patient and with midodrine help and PT help, patient started to improve and per PT, he is safe to discharge today.  Discharge plan was discussed with patient and/or family member and they verbalized understanding and agreed with it.  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, right (HCC)   PAD (peripheral artery disease) (HCC)   Osteomyelitis (HCC)   Obesity, Class II, BMI 35-39.9    Discharge Instructions   Allergies as of 05/10/2023       Reactions   Doxycycline Hives, Rash   Septra [bactrim] Itching   Penicillins Rash   Allergy to All cillin drugs  Ancef given 9/24  with no obvious reaction   Metformin And Related Diarrhea, Nausea Only   Other Nausea And Vomiting   General Anesthesia - sometimes causes nausea and vomiting * OK to use scopolamine patch     Sulfamethoxazole-trimethoprim Other (See Comments)   Wellbutrin [bupropion]    Mood Changes        Medication List     STOP taking these medications    carvedilol 3.125 MG tablet Commonly known as: COREG   gabapentin 800 MG tablet Commonly known as: NEURONTIN Replaced by: gabapentin 300 MG capsule   HumuLIN R U-500 KwikPen 500 UNIT/ML KwikPen Generic drug: insulin regular human CONCENTRATED   potassium chloride 10 MEQ CR capsule Commonly known as: MICRO-K   rosuvastatin 20 MG tablet Commonly known as: CRESTOR       TAKE these medications    acetaminophen 500 MG tablet Commonly known as: TYLENOL Take 1 tablet (500 mg total) by mouth every 6 (six) hours as needed for mild pain (pain score 1-3).   allopurinol 100 MG tablet Commonly known as: ZYLOPRIM Take 0.5 tablets (50 mg total) by mouth daily. What changed: how much to take   amiodarone 200 MG tablet Commonly known as: PACERONE Take 1 tablet (200 mg total) by mouth 2 (two) times daily.   atorvastatin 40 MG  tablet Commonly known as: LIPITOR Take 1 tablet (40 mg total) by mouth daily.   B-D ULTRAFINE III SHORT PEN 31G X 8 MM Misc Generic drug: Insulin Pen Needle Inject 1 each into the skin 3 (three) times daily.   benzonatate 200 MG capsule Commonly known as: TESSALON Take 1 capsule (200 mg total) by mouth 3 (three) times daily as needed for cough.   clopidogrel 75 MG tablet Commonly known as: PLAVIX Take 75 mg by mouth every morning.   colchicine 0.6 MG tablet Take 0.6 mg by mouth daily.   docusate sodium 100 MG capsule Commonly known as: COLACE Take 1 capsule (100 mg total) by mouth 2 (two) times daily.   DULoxetine 60 MG capsule Commonly known as: CYMBALTA Take 60 mg by mouth daily.   Eliquis 5 MG Tabs tablet Generic drug: apixaban TAKE 1 TABLET BY MOUTH TWICE A DAY   ezetimibe 10 MG tablet Commonly known as: ZETIA Take 10 mg by mouth daily.   FreeStyle Libre 14 Day Sensor Misc APPLY 1 SENSOR EVERY 14 DAYS   furosemide 80 MG tablet Commonly known as: LASIX Take 0.5 tablets (40 mg total) by mouth daily. May take additional tablet for weight gain of 3 pounds in one day or five pounds in one week What changed:  how much to take when to take this   gabapentin 300 MG capsule Commonly known as: NEURONTIN Take 1 capsule (300 mg total) by mouth at bedtime. Replaces: gabapentin 800 MG tablet   levothyroxine 88 MCG tablet Commonly known as: SYNTHROID Take 88 mcg by mouth daily before breakfast.   loratadine 10 MG tablet Commonly known as: CLARITIN Take 1 tablet (10 mg total) by mouth daily.   midodrine 5 MG tablet Commonly known as: PROAMATINE Take 1 tablet (5 mg total) by mouth 3 (three) times daily with meals.   Mounjaro 15 MG/0.5ML Pen Generic drug: tirzepatide Inject 15 mg into the skin once a week. What changed: additional instructions   multivitamin with minerals Tabs tablet Take 1 tablet by mouth daily.   nitroGLYCERIN 0.4 MG SL tablet Commonly known  as: NITROSTAT PLACE 1 TAB UNDER THE TONGUE EVERY 5 MINUTES AS  NEEDED FOR CHEST PAIN (SEVERE PRESSURE OR TIGHTNESS)   pantoprazole 40 MG tablet Commonly known as: PROTONIX TAKE 1 TABLET BY MOUTH EVERY DAY   PRESCRIPTION MEDICATION Pt uses CPAP Machine at bedtime   Sodium Fluoride 5000 PPM 1.1 % Pste Generic drug: Sodium Fluoride Take 1 Application by mouth in the morning and at bedtime.   tamsulosin 0.4 MG Caps capsule Commonly known as: FLOMAX Take 1 capsule (0.4 mg total) by mouth daily.   traZODone 100 MG tablet Commonly known as: DESYREL Take 100 mg by mouth at bedtime.   Evaristo Bury FlexTouch 200 UNIT/ML FlexTouch Pen Generic drug: insulin degludec Inject 28 Units into the skin 2 (two) times daily. What changed:  how much to take when to take this               Durable Medical Equipment  (From admission, onward)           Start     Ordered   04/29/23 1439  For home use only DME Bedside commode  Once       Comments: Bariatric Bedside commode- confined to one room  Question:  Patient needs a bedside commode to treat with the following condition  Answer:  General weakness   04/29/23 1439            Follow-up Information     [facility] Follow up in 1 month(s).   Why: The office will call you with your appointment Contact information: [address] [place] [address] [phone]        Nadara Mustard, MD Follow up in 1 week(s).   Specialty: Orthopedic Surgery Contact information: [address] [place] [address] [phone]         [facility] Follow up.   Why: Home health provider they will call to reset up services Contact information: [address] [place] [address] [phone]         [name], MD Follow up in 2 week(s).   Specialty: Internal Medicine Why: Hospital follow up Contact information: [address] [place]  [address] [phone]         [facility] Follow up.   Why: Bariatric Bedside Commode. Contact information: Located in: [employer] Address: [address], [place], [place] [address]  Phone: [phone]        [facility], [facility]. Go on 05/13/2023.   Why: Schedule is Tuesday, Thursday, Saturday with 10:10 am chair time.  On Tuesday, please arrive at 9:30 am to complete paperwork prior to treatment. Contact information: [address] [place] [address] [phone]         [name], MD Follow up in 1 week(s).   Specialty: Internal Medicine Contact information: [address] [place] [address] [phone]                Allergies  Allergen Reactions   Doxycycline Hives and Rash   Septra [Bactrim] Itching   Penicillins Rash    Allergy to All cillin drugs  Ancef given 9/24 with no obvious reaction   Metformin And Related Diarrhea and Nausea Only   Other Nausea And Vomiting    General Anesthesia - sometimes causes nausea and vomiting * OK to use scopolamine patch     Sulfamethoxazole-Trimethoprim Other (See Comments)   Wellbutrin [Bupropion]     Mood Changes    Consultations: Cardiology, nephrology,  palliative care, ID, vascular surgery, IR, wound care, orthopedics   Procedures/Studies: IR Removal Tun Cv Cath W/O FL Result Date: 05/05/2023 INDICATION: 68 year old male with recent osteomyelitis s/p IV antibiotic therapy via right tunneled central venous catheter placed 04/07/23. Catheter no longer needed. EXAM: REMOVAL TUNNELED CENTRAL VENOUS CATHETER MEDICATIONS: None ANESTHESIA/SEDATION: None FLUOROSCOPY: None COMPLICATIONS: None immediate. PROCEDURE: Informed written consent was obtained from the patient after a thorough discussion of the procedural risks, benefits and alternatives. All questions were addressed. Maximal Sterile Barrier Technique was utilized including caps, mask, sterile gowns,  sterile gloves, sterile drape, hand hygiene and skin antiseptic. A timeout was performed prior to the initiation of the procedure. The patient's right chest and catheter was prepped and draped in a normal sterile fashion. Heparin was removed from both ports of catheter. 1% lidocaine was used for local anesthesia. Using manual traction the cuff of the catheter was exposed and the catheter was removed in it's entirety. Pressure was held till hemostasis was obtained. A sterile dressing was applied. The patient tolerated the procedure well with no immediate complications. IMPRESSION: Successful catheter removal as described above. Performed by: Loyce Dys PA-C Electronically Signed   By: Corlis Leak M.D.   On: 05/05/2023 16:05   CT HEAD WO CONTRAST ( ) Result Date: 04/29/2023 CLINICAL DATA:  68 year old male with vertigo. EXAM: CT HEAD WITHOUT CONTRAST TECHNIQUE: Contiguous axial images were obtained from the base of the skull through the vertex without intravenous contrast. RADIATION DOSE REDUCTION: This exam was performed according to the departmental dose-optimization program which includes automated exposure control, adjustment of the mA and/or kV according to patient size and/or use of iterative reconstruction technique. COMPARISON:  Brain MRI and head CT 04/19/2023 FINDINGS: Brain: No midline shift, ventriculomegaly, mass effect, evidence of mass lesion, intracranial hemorrhage or evidence of cortically based acute infarction. Gray-white differentiation stable and within normal limits for age. Vascular: No suspicious intracranial vascular hyperdensity. Extensive calcified atherosclerosis at the skull base. Skull: No acute osseous abnormality identified. Partially visible advanced upper cervical spine degeneration. Sinuses/Orbits: Visualized paranasal sinuses and mastoids are stable and well aerated. Tympanic cavities appear clear. Other: Stable orbit and scalp soft tissues. Some calcified scalp vessel  atherosclerosis. IMPRESSION: 1. Stable and negative for age noncontrast CT appearance of the Brain. 2. Advanced calcified atherosclerosis. Electronically Signed   By: Odessa Fleming M.D.   On: 04/29/2023 15:41   IR Fluoro Guide CV Line Left Result Date: 04/22/2023 INDICATION: 43 year old with acute kidney injury. Patient needs a dialysis catheter. EXAM: FLUOROSCOPIC AND ULTRASOUND GUIDED PLACEMENT OF A TUNNELED DIALYSIS CATHETER Physician: Rachelle Hora. Lowella Dandy, MD MEDICATIONS: Vancomycin 1 g; The antibiotic was administered within an appropriate time interval prior to skin puncture. ANESTHESIA/SEDATION: Moderate (conscious) sedation was employed during this procedure. A total of Versed 1.0mg  and fentanyl 50 mcg was administered intravenously at the order of the provider performing the procedure. Total intra-service moderate sedation time: 20 minutes. Patient's level of consciousness and vital signs were monitored continuously by radiology nurse throughout the procedure under the supervision of the provider performing the procedure. FLUOROSCOPY TIME:  Radiation Exposure Index (as provided by the fluoroscopic device): 8 mGy Kerma COMPLICATIONS: None immediate. PROCEDURE: Informed consent was obtained for placement of a tunneled dialysis catheter. The patient was placed supine on the interventional table. Ultrasound confirmed a patent left internal jugular vein. Ultrasound image obtained for documentation. The left neck and chest was prepped and draped in a sterile fashion. Maximal barrier sterile technique was utilized including caps,  mask, sterile gowns, sterile gloves, sterile drape, hand hygiene and skin antiseptic. The left neck was anesthetized with 1% lidocaine. A small incision was made with #11 blade scalpel. A 21 gauge needle directed into the left internal jugular vein with ultrasound guidance. A micropuncture dilator set was placed. A 28 cm tip to cuff Palindrome catheter was selected. The skin below the left clavicle  was anesthetized and a small incision was made with an #11 blade scalpel. A subcutaneous tunnel was formed to the vein dermatotomy site. The catheter was brought through the tunnel. The vein dermatotomy site was dilated to accommodate a peel-away sheath over a wire. The catheter was placed through the peel-away sheath and directed into the central venous structures. The tip of the catheter was placed at superior cavoatrial junction with fluoroscopy. Fluoroscopic images were obtained for documentation. Both lumens were found to aspirate and flush well. The proper amount of heparin was flushed in both lumens. The vein dermatotomy site was closed using a single layer of absorbable suture and Dermabond. The catheter was secured to the skin using Prolene suture. IMPRESSION: Successful placement of a left jugular tunneled dialysis catheter using ultrasound and fluoroscopic guidance. Electronically Signed   By: Richarda Overlie M.D.   On: 04/22/2023 15:05   IR US Guide Vasc Access Left Result Date: 04/22/2023 INDICATION: 55 year old with acute kidney injury. Patient needs a dialysis catheter. EXAM: FLUOROSCOPIC AND ULTRASOUND GUIDED PLACEMENT OF A TUNNELED DIALYSIS CATHETER Physician: Rachelle Hora. Lowella Dandy, MD MEDICATIONS: Vancomycin 1 g; The antibiotic was administered within an appropriate time interval prior to skin puncture. ANESTHESIA/SEDATION: Moderate (conscious) sedation was employed during this procedure. A total of Versed 1.0mg  and fentanyl 50 mcg was administered intravenously at the order of the provider performing the procedure. Total intra-service moderate sedation time: 20 minutes. Patient's level of consciousness and vital signs were monitored continuously by radiology nurse throughout the procedure under the supervision of the provider performing the procedure. FLUOROSCOPY TIME:  Radiation Exposure Index (as provided by the fluoroscopic device): 8 mGy Kerma COMPLICATIONS: None immediate. PROCEDURE: Informed consent was  obtained for placement of a tunneled dialysis catheter. The patient was placed supine on the interventional table. Ultrasound confirmed a patent left internal jugular vein. Ultrasound image obtained for documentation. The left neck and chest was prepped and draped in a sterile fashion. Maximal barrier sterile technique was utilized including caps, mask, sterile gowns, sterile gloves, sterile drape, hand hygiene and skin antiseptic. The left neck was anesthetized with 1% lidocaine. A small incision was made with #11 blade scalpel. A 21 gauge needle directed into the left internal jugular vein with ultrasound guidance. A micropuncture dilator set was placed. A 28 cm tip to cuff Palindrome catheter was selected. The skin below the left clavicle was anesthetized and a small incision was made with an #11 blade scalpel. A subcutaneous tunnel was formed to the vein dermatotomy site. The catheter was brought through the tunnel. The vein dermatotomy site was dilated to accommodate a peel-away sheath over a wire. The catheter was placed through the peel-away sheath and directed into the central venous structures. The tip of the catheter was placed at superior cavoatrial junction with fluoroscopy. Fluoroscopic images were obtained for documentation. Both lumens were found to aspirate and flush well. The proper amount of heparin was flushed in both lumens. The vein dermatotomy site was closed using a single layer of absorbable suture and Dermabond. The catheter was secured to the skin using Prolene suture. IMPRESSION: Successful placement of a  left jugular tunneled dialysis catheter using ultrasound and fluoroscopic guidance. Electronically Signed   By: Richarda Overlie M.D.   On: 04/22/2023 15:05   MR BRAIN WO CONTRAST Result Date: 04/19/2023 CLINICAL DATA:  Right facial droop and slurred speech. EXAM: MRI HEAD WITHOUT CONTRAST TECHNIQUE: Multiplanar, multiecho pulse sequences of the brain and surrounding structures were obtained  without intravenous contrast. COMPARISON:  CT head without contrast 04/19/2023. FINDINGS: Brain: No acute infarct, hemorrhage, or mass lesion is present. Moderate generalized atrophy is present. Mild white matter changes are noted bilaterally. The ventricles are proportionate to the degree of atrophy. Deep brain nuclei are within normal limits. No significant extraaxial fluid collection is present. The brainstem and cerebellum are within normal limits. The internal auditory canals are within normal limits. Midline structures are within normal limits. Vascular: Flow is present in the major intracranial arteries. Skull and upper cervical spine: The craniocervical junction is normal. Upper cervical spine is within normal limits. Marrow signal is unremarkable. Sinuses/Orbits: The paranasal sinuses and mastoid air cells are clear. Bilateral lens replacements are noted. Globes and orbits are otherwise unremarkable. IMPRESSION: 1. No acute intracranial abnormality or significant interval change. 2. Moderate generalized atrophy and white matter disease likely reflects the sequela of chronic microvascular ischemia. Electronically Signed   By: Marin Roberts M.D.   On: 04/19/2023 19:06   CT HEAD CODE STROKE WO CONTRAST Result Date: 04/19/2023 CLINICAL DATA:  Code stroke. Acute onset of right upper extremity weakness and facial droop. Abnormal speech. EXAM: CT HEAD WITHOUT CONTRAST TECHNIQUE: Contiguous axial images were obtained from the base of the skull through the vertex without intravenous contrast. RADIATION DOSE REDUCTION: This exam was performed according to the departmental dose-optimization program which includes automated exposure control, adjustment of the mA and/or kV according to patient size and/or use of iterative reconstruction technique. COMPARISON:  CT head without contrast 01/09/2023 FINDINGS: Brain: Mild atrophy and white matter changes are similar the prior exam. No acute infarct, hemorrhage, or  mass lesion is present. The ventricles are of normal size. No significant extraaxial fluid collection is present. Midline structures are within normal limits. The brainstem and cerebellum are within normal limits. Vascular: Atherosclerotic calcifications are present within the cavernous internal carotid arteries and at the dural margins of both vertebral arteries bilaterally. Skull: Calvarium is intact. No focal lytic or blastic lesions are present. No significant extracranial soft tissue lesion is present. Sinuses/Orbits: Small polyps or mucous retention cysts are present along the inferior maxillary sinuses bilaterally. No fluid levels are present. The paranasal sinuses and mastoid air cells are otherwise clear. ASPECTS [facility] Stroke Program Early CT Score) - Ganglionic level infarction (caudate, lentiform nuclei, internal capsule, insula, M1-M3 cortex): 7/7 - Supraganglionic infarction (M4-M6 cortex): 3/3 Total score (0-10 with 10 being normal): /10 IMPRESSION: 1. No acute intracranial abnormality or significant interval change. 2. Stable atrophy and white matter disease. This likely reflects the sequela of chronic microvascular ischemia. 3. Aspects is 10. The above was relayed via text pager to Dr. Brooke Dare on 04/19/2023 at 13:39 . Electronically Signed   By: Marin Roberts M.D.   On: 04/19/2023 13:39   CT FEMUR LEFT WO CONTRAST Result Date: 04/17/2023 CLINICAL DATA:  Left lower thigh pain with firmness on palpation, evaluate for hematoma EXAM: CT OF THE LOWER LEFT EXTREMITY WITHOUT CONTRAST TECHNIQUE: Multidetector CT imaging of the lower left extremity was performed according to the standard protocol. RADIATION DOSE REDUCTION: This exam was performed according to the departmental dose-optimization program which  includes automated exposure control, adjustment of the mA and/or kV according to patient size and/or use of iterative reconstruction technique. COMPARISON:  None Available. FINDINGS:  Bones/Joint/Cartilage Degenerative changes of left hip joint are seen. No acute fracture or dislocation is noted. Mild degenerative changes about the knee joint noted. No knee joint effusion is seen. Ligaments Suboptimally assessed by CT. Muscles and Tendons Surrounding musculature shows no focal abnormality. Soft tissues Mild soft tissue edema is noted in the mid and distal thigh. No focal soft tissue mass lesion is noted. IMPRESSION: No acute abnormality to correspond with the physical exam. Mild subcutaneous edema is noted in the mid to distal thigh. Electronically Signed   By: Alcide Clever M.D.   On: 04/17/2023 23:29   DG CHEST PORT 1 VIEW Result Date: 04/17/2023 CLINICAL DATA:  Shortness of breath, cough. EXAM: PORTABLE CHEST 1 VIEW COMPARISON:  March 27, 2023. FINDINGS: Stable cardiomediastinal silhouette. Increased bilateral perihilar and basilar opacities are noted concerning for edema or possibly infiltrate. Right internal jugular catheter is noted. Bony thorax is unremarkable. IMPRESSION: Bilateral perihilar and basilar opacities are noted concerning for edema or possibly pneumonia. Electronically Signed   By: Lupita Raider M.D.   On: 04/17/2023 11:24   ECHOCARDIOGRAM COMPLETE Result Date: 04/16/2023    ECHOCARDIOGRAM REPORT   Patient Name:   Harold [name] Date of Exam: 04/16/2023 Medical Rec #:  [id number]           Height:       73.0 in Accession #:    [id number]          Weight:       298.7 lb Date of Birth:  10/18/1955           BSA:          2.552 m Patient Age:    67 years            BP:           149/65 mmHg Patient Gender: M                   HR:           63 bpm. Exam Location:  Inpatient Procedure: 2D Echo, Color Doppler and Cardiac Doppler (Both Spectral and Color            Flow Doppler were utilized during procedure). Indications:    acute diastolic chf  History:        Patient has prior history of Echocardiogram examinations, most                 recent 02/21/2022. CAD, chronic  kidney disease and PAD; Risk                 Factors:Hypertension, Diabetes, Dyslipidemia and Sleep Apnea.  Sonographer:    Delcie Roch RDCS Referring Phys: [phone] STEPHEN K CHIU IMPRESSIONS  1. Left ventricular ejection fraction, by estimation, is 55 to 60%. The left ventricle has normal function. The left ventricle demonstrates regional wall motion abnormalities with basal inferior hypokinesis. The left ventricular internal cavity size was  mildly dilated. Left ventricular diastolic parameters are consistent with Grade II diastolic dysfunction (pseudonormalization).  2. Right ventricular systolic function is normal. The right ventricular size is mildly enlarged. There is mildly elevated pulmonary artery systolic pressure. The estimated right ventricular systolic pressure is 38.3 mmHg.  3. Left atrial size was moderately dilated.  4. Right atrial size was mildly dilated.  5. The mitral  valve is normal in structure. Trivial mitral valve regurgitation. No evidence of mitral stenosis.  6. The aortic valve is tricuspid. There is mild calcification of the aortic valve. Aortic valve regurgitation is not visualized. No aortic stenosis is present.  7. The inferior vena cava is normal in size with greater than 50% respiratory variability, suggesting right atrial pressure of 3 mmHg. FINDINGS  Left Ventricle: Left ventricular ejection fraction, by estimation, is 55 to 60%. The left ventricle has normal function. The left ventricle demonstrates regional wall motion abnormalities. The left ventricular internal cavity size was mildly dilated. There is no left ventricular hypertrophy. Left ventricular diastolic parameters are consistent with Grade II diastolic dysfunction (pseudonormalization). Right Ventricle: The right ventricular size is mildly enlarged. No increase in right ventricular wall thickness. Right ventricular systolic function is normal. There is mildly elevated pulmonary artery systolic pressure. The tricuspid  regurgitant velocity is 2.97 m/s, and with an assumed right atrial pressure of 3 mmHg, the estimated right ventricular systolic pressure is 38.3 mmHg. Left Atrium: Left atrial size was moderately dilated. Right Atrium: Right atrial size was mildly dilated. Pericardium: There is no evidence of pericardial effusion. Mitral Valve: The mitral valve is normal in structure. Mild mitral annular calcification. Trivial mitral valve regurgitation. No evidence of mitral valve stenosis. Tricuspid Valve: The tricuspid valve is normal in structure. Tricuspid valve regurgitation is trivial. Aortic Valve: The aortic valve is tricuspid. There is mild calcification of the aortic valve. Aortic valve regurgitation is not visualized. No aortic stenosis is present. Pulmonic Valve: The pulmonic valve was normal in structure. Pulmonic valve regurgitation is not visualized. Aorta: The aortic root is normal in size and structure. Venous: The inferior vena cava is normal in size with greater than 50% respiratory variability, suggesting right atrial pressure of 3 mmHg. IAS/Shunts: No atrial level shunt detected by color flow Doppler.  LEFT VENTRICLE PLAX 2D LVIDd:         6.30 cm   Diastology LVIDs:         4.80 cm   LV e' medial:    7.94 cm/s LV PW:         1.20 cm   LV E/e' medial:  16.8 LV IVS:        1.20 cm   LV e' lateral:   10.00 cm/s LVOT diam:     2.40 cm   LV E/e' lateral: 13.3 LV SV:         99 LV SV Index:   39 LVOT Area:     4.52 cm  RIGHT VENTRICLE             IVC RV Basal diam:  3.00 cm     IVC diam: 1.70 cm RV S prime:     12.80 cm/s TAPSE (M-mode): 2.3 cm LEFT ATRIUM              Index        RIGHT ATRIUM           Index LA diam:        5.20 cm  2.04 cm/m   RA Area:     19.30 cm LA Vol (A2C):   125.0 ml 48.99 ml/m  RA Volume:   51.10 ml  20.03 ml/m LA Vol (A4C):   106.0 ml 41.54 ml/m LA Biplane Vol: 123.0 ml 48.20 ml/m  AORTIC VALVE LVOT Vmax:   96.10 cm/s LVOT Vmean:  64.300 cm/s LVOT VTI:    0.219 m  AORTA Ao Root  diam: 3.40 cm Ao Asc diam:  3.60 cm MITRAL VALVE                TRICUSPID VALVE MV Area (PHT): 3.72 cm     TR Peak grad:   35.3 mmHg MV Decel Time: 204 msec     TR Vmax:        297.00 cm/s MV E velocity: 133.00 cm/s MV A velocity: 50.10 cm/s   SHUNTS MV E/A ratio:  2.65         Systemic VTI:  0.22 m                             Systemic Diam: 2.40 cm Dalton McleanMD Electronically signed by Wilfred Lacy Signature Date/Time: 04/16/2023/2:24:56 PM    Final    US RENAL Result Date: 04/16/2023 CLINICAL DATA:  Acute kidney injury EXAM: RENAL / URINARY TRACT ULTRASOUND COMPLETE COMPARISON:  None Available. FINDINGS: Right Kidney: Renal measurements: 12.9 x 6.9 x 6.3 cm = volume: 293 mL. Echogenicity within normal limits. No mass or hydronephrosis visualized. Left Kidney: Renal measurements: 13.0 x 6.2 x 5.5 cm = volume: 231 mL. Echogenicity within normal limits. No mass or hydronephrosis visualized. Bladder: Appears normal for degree of bladder distention. Other: None. IMPRESSION: Negative renal ultrasound. Electronically Signed   By: Charline Bills M.D.   On: 04/16/2023 02:42     Discharge Exam: Vitals:   05/09/23 1939 05/10/23 0446  BP: (!) 140/59 (!) 143/57  Pulse: 60 (!) 51  Resp: 18 18  Temp: 98.2 F (36.8 C) 97.6 F (36.4 C)  SpO2:  99%   Vitals:   05/09/23 0738 05/09/23 1408 05/09/23 1939 05/10/23 0446  BP: (!) 161/64 (!) 126/57 (!) 140/59 (!) 143/57  Pulse: 61 63 60 (!) 51  Resp: 17 16 18 18   Temp: 97.8 F (36.6 C)  98.2 F (36.8 C) 97.6 F (36.4 C)  TempSrc: Axillary  Oral Oral  SpO2: 96%   99%  Weight:    126 kg  Height:        General: Pt is alert, awake, not in acute distress Cardiovascular: RRR, S1/S2 +, no rubs, no gallops Respiratory: CTA bilaterally, no wheezing, no rhonchi Abdominal: Soft, NT, ND, bowel sounds + Extremities: no edema, no cyanosis    The results of significant diagnostics from this hospitalization (including imaging, microbiology, ancillary and  laboratory) are listed below for reference.     Microbiology: No results found for this or any previous visit (from the past 240 hours).   Labs: BNP (last 3 results) Recent Labs    04/17/23 1217  BNP 765.1*   Basic Metabolic Panel: Recent Labs  Lab 05/06/23 0625 05/07/23 0407 05/08/23 0511 05/09/23 0414 05/10/23 0358  NA 137 136 135 138 140  K 4.2 4.6 4.0 3.9 4.6  CL 99 99 101 105 102  CO2 26 30 26 27 25   GLUCOSE 169* 167* 193* 102* 99  BUN 34* 22 28* 30* 30*  CREATININE 4.98* 3.71* 4.08* 4.23* 4.07*  CALCIUM 8.9 8.6* 8.2* 8.2* 8.8*  PHOS 4.6 3.5 4.1 4.6 4.6   Liver Function Tests: Recent Labs  Lab 05/06/23 0625 05/07/23 0407 05/08/23 0511 05/09/23 0414 05/10/23 0358  ALBUMIN 2.7* 2.6* 2.6* 2.6* 2.6*   No results for input(s): "LIPASE", "AMYLASE" in the last 168 hours. No results for input(s): "AMMONIA" in the last 168 hours. CBC: Recent Labs  Lab 05/06/23 0918  WBC 6.5  HGB 8.0*  HCT 24.3*  MCV 91.4  PLT 172   Cardiac Enzymes: No results for input(s): "CKTOTAL", "CKMB", "CKMBINDEX", "TROPONINI" in the last 168 hours. BNP: Invalid input(s): "POCBNP" CBG: Recent Labs  Lab 05/09/23 2359 05/10/23 0456 05/10/23 0754 05/10/23 0811 05/10/23 0837  GLUCAP 145* 71 70 70 100*   D-Dimer No results for input(s): "DDIMER" in the last 72 hours. Hgb A1c No results for input(s): "HGBA1C" in the last 72 hours. Lipid Profile No results for input(s): "CHOL", "HDL", "LDLCALC", "TRIG", "CHOLHDL", "LDLDIRECT" in the last 72 hours. Thyroid function studies No results for input(s): "TSH", "T4TOTAL", "T3FREE", "THYROIDAB" in the last 72 hours.  Invalid input(s): "FREET3" Anemia work up No results for input(s): "VITAMINB12", "FOLATE", "FERRITIN", "TIBC", "IRON", "RETICCTPCT" in the last 72 hours. Urinalysis    Component Value Date/Time   COLORURINE YELLOW 04/15/2023 1732   APPEARANCEUR CLOUDY (A) 04/15/2023 1732   LABSPEC 1.014 04/15/2023 1732   PHURINE 5.0  04/15/2023 1732   GLUCOSEU >=500 (A) 04/15/2023 1732   HGBUR MODERATE (A) 04/15/2023 1732   BILIRUBINUR NEGATIVE 04/15/2023 1732   BILIRUBINUR negative 05/12/2018 1213   BILIRUBINUR small 06/29/2014 0859   KETONESUR NEGATIVE 04/15/2023 1732   PROTEINUR >=300 (A) 04/15/2023 1732   UROBILINOGEN 0.2 05/12/2018 1213   UROBILINOGEN 1.0 05/02/2014 2015   NITRITE NEGATIVE 04/15/2023 1732   LEUKOCYTESUR NEGATIVE 04/15/2023 1732   Sepsis Labs Recent Labs  Lab 05/06/23 0918  WBC 6.5   Microbiology No results found for this or any previous visit (from the past 240 hours).  FURTHER DISCHARGE INSTRUCTIONS:   Get Medicines reviewed and adjusted: Please take all your medications with you for your next visit with your Primary MD   Laboratory/radiological data: Please request your Primary MD to go over all hospital tests and procedure/radiological results at the follow up, please ask your Primary MD to get all Hospital records sent to his/her office.   In some cases, they will be blood work, cultures and biopsy results pending at the time of your discharge. Please request that your primary care M.D. goes through all the records of your hospital data and follows up on these results.   Also Note the following: If you experience worsening of your admission symptoms, develop shortness of breath, life threatening emergency, suicidal or homicidal thoughts you must seek medical attention immediately by calling 911 or calling your MD immediately  if symptoms less severe.   You must read complete instructions/literature along with all the possible adverse reactions/side effects for all the Medicines you take and that have been prescribed to you. Take any new Medicines after you have completely understood and accpet all the possible adverse reactions/side effects.    Do not drive when taking Pain medications or sleeping medications (Benzodaizepines)   Do not take more than prescribed Pain, Sleep and  Anxiety Medications. It is not advisable to combine anxiety,sleep and pain medications without talking with your primary care practitioner   Special Instructions: If you have smoked or chewed Tobacco  in the last 2 yrs please stop smoking, stop any regular Alcohol  and or any Recreational drug use.   Wear Seat belts while driving.   Please note: You were cared for by a hospitalist during your hospital stay. Once you are discharged, your primary care physician will handle any further medical issues. Please note that NO REFILLS for any discharge medications will be authorized once you are discharged, as it is imperative that you return to your primary care physician (  or establish a relationship with a primary care physician if you do not have one) for your post hospital discharge needs so that they can reassess your need for medications and monitor your lab values  Time coordinating discharge: Over 30 minutes  SIGNED:   Hughie Closs, MD  [facility] 05/10/2023, 9:17 AM *Please note that this is a verbal dictation therefore any spelling or grammatical errors are due to the "[employer]" system interpretation. If 7PM-7AM, please contact night-coverage [email or web address]

## 2023-05-11 ENCOUNTER — Other Ambulatory Visit (HOSPITAL_COMMUNITY): Payer: Self-pay | Admitting: Physician Assistant

## 2023-05-12 ENCOUNTER — Encounter (HOSPITAL_COMMUNITY)
Admission: EM | Disposition: A | Discharge status: Home / Self Care | Payer: Self-pay | Source: Home / Self Care | Attending: Internal Medicine

## 2023-05-12 ENCOUNTER — Inpatient Hospital Stay (HOSPITAL_COMMUNITY)
Admission: EM | Admit: 2023-05-12 | Discharge: 2023-05-21 | DRG: 492 | Disposition: A | Discharge status: Home / Self Care | Attending: Internal Medicine | Admitting: Internal Medicine

## 2023-05-12 ENCOUNTER — Inpatient Hospital Stay (HOSPITAL_COMMUNITY): Admitting: Anesthesiology

## 2023-05-12 ENCOUNTER — Encounter (HOSPITAL_COMMUNITY): Payer: Self-pay | Admitting: Emergency Medicine

## 2023-05-12 ENCOUNTER — Other Ambulatory Visit: Payer: Self-pay

## 2023-05-12 ENCOUNTER — Inpatient Hospital Stay (HOSPITAL_COMMUNITY)

## 2023-05-12 ENCOUNTER — Emergency Department (HOSPITAL_COMMUNITY)

## 2023-05-12 DIAGNOSIS — W19XXXA Unspecified fall, initial encounter: Secondary | ICD-10-CM | POA: Diagnosis not present

## 2023-05-12 DIAGNOSIS — M1A9XX0 Chronic gout, unspecified, without tophus (tophi): Secondary | ICD-10-CM | POA: Diagnosis present

## 2023-05-12 DIAGNOSIS — S82851B Displaced trimalleolar fracture of right lower leg, initial encounter for open fracture type I or II: Secondary | ICD-10-CM

## 2023-05-12 DIAGNOSIS — F32A Depression, unspecified: Secondary | ICD-10-CM | POA: Diagnosis present

## 2023-05-12 DIAGNOSIS — Z6839 Body mass index (BMI) 39.0-39.9, adult: Secondary | ICD-10-CM

## 2023-05-12 DIAGNOSIS — I10 Essential (primary) hypertension: Secondary | ICD-10-CM

## 2023-05-12 DIAGNOSIS — M25571 Pain in right ankle and joints of right foot: Secondary | ICD-10-CM | POA: Diagnosis present

## 2023-05-12 DIAGNOSIS — I878 Other specified disorders of veins: Secondary | ICD-10-CM | POA: Diagnosis present

## 2023-05-12 DIAGNOSIS — I517 Cardiomegaly: Secondary | ICD-10-CM | POA: Diagnosis not present

## 2023-05-12 DIAGNOSIS — L89322 Pressure ulcer of left buttock, stage 2: Secondary | ICD-10-CM | POA: Diagnosis not present

## 2023-05-12 DIAGNOSIS — D631 Anemia in chronic kidney disease: Secondary | ICD-10-CM | POA: Diagnosis present

## 2023-05-12 DIAGNOSIS — Z888 Allergy status to other drugs, medicaments and biological substances status: Secondary | ICD-10-CM

## 2023-05-12 DIAGNOSIS — N184 Chronic kidney disease, stage 4 (severe): Secondary | ICD-10-CM | POA: Diagnosis not present

## 2023-05-12 DIAGNOSIS — Z79899 Other long term (current) drug therapy: Secondary | ICD-10-CM

## 2023-05-12 DIAGNOSIS — E43 Unspecified severe protein-calorie malnutrition: Secondary | ICD-10-CM | POA: Diagnosis not present

## 2023-05-12 DIAGNOSIS — R509 Fever, unspecified: Secondary | ICD-10-CM | POA: Diagnosis not present

## 2023-05-12 DIAGNOSIS — Z881 Allergy status to other antibiotic agents status: Secondary | ICD-10-CM

## 2023-05-12 DIAGNOSIS — E1122 Type 2 diabetes mellitus with diabetic chronic kidney disease: Secondary | ICD-10-CM | POA: Diagnosis not present

## 2023-05-12 DIAGNOSIS — I482 Chronic atrial fibrillation, unspecified: Secondary | ICD-10-CM | POA: Diagnosis present

## 2023-05-12 DIAGNOSIS — M898X9 Other specified disorders of bone, unspecified site: Secondary | ICD-10-CM | POA: Diagnosis present

## 2023-05-12 DIAGNOSIS — E039 Hypothyroidism, unspecified: Secondary | ICD-10-CM | POA: Diagnosis not present

## 2023-05-12 DIAGNOSIS — S82401D Unspecified fracture of shaft of right fibula, subsequent encounter for closed fracture with routine healing: Secondary | ICD-10-CM | POA: Diagnosis not present

## 2023-05-12 DIAGNOSIS — D62 Acute posthemorrhagic anemia: Secondary | ICD-10-CM | POA: Diagnosis not present

## 2023-05-12 DIAGNOSIS — S8251XA Displaced fracture of medial malleolus of right tibia, initial encounter for closed fracture: Secondary | ICD-10-CM | POA: Diagnosis not present

## 2023-05-12 DIAGNOSIS — Z8619 Personal history of other infectious and parasitic diseases: Secondary | ICD-10-CM

## 2023-05-12 DIAGNOSIS — I739 Peripheral vascular disease, unspecified: Secondary | ICD-10-CM | POA: Diagnosis not present

## 2023-05-12 DIAGNOSIS — I272 Pulmonary hypertension, unspecified: Secondary | ICD-10-CM | POA: Diagnosis not present

## 2023-05-12 DIAGNOSIS — E1142 Type 2 diabetes mellitus with diabetic polyneuropathy: Secondary | ICD-10-CM | POA: Diagnosis present

## 2023-05-12 DIAGNOSIS — S82891A Other fracture of right lower leg, initial encounter for closed fracture: Secondary | ICD-10-CM | POA: Diagnosis not present

## 2023-05-12 DIAGNOSIS — Z7902 Long term (current) use of antithrombotics/antiplatelets: Secondary | ICD-10-CM

## 2023-05-12 DIAGNOSIS — R0781 Pleurodynia: Secondary | ICD-10-CM | POA: Diagnosis not present

## 2023-05-12 DIAGNOSIS — S89301S Unspecified physeal fracture of lower end of right fibula, sequela: Principal | ICD-10-CM

## 2023-05-12 DIAGNOSIS — G8918 Other acute postprocedural pain: Secondary | ICD-10-CM | POA: Diagnosis not present

## 2023-05-12 DIAGNOSIS — I447 Left bundle-branch block, unspecified: Secondary | ICD-10-CM | POA: Diagnosis present

## 2023-05-12 DIAGNOSIS — W1839XA Other fall on same level, initial encounter: Secondary | ICD-10-CM | POA: Diagnosis present

## 2023-05-12 DIAGNOSIS — I129 Hypertensive chronic kidney disease with stage 1 through stage 4 chronic kidney disease, or unspecified chronic kidney disease: Secondary | ICD-10-CM | POA: Diagnosis not present

## 2023-05-12 DIAGNOSIS — A4159 Other Gram-negative sepsis: Secondary | ICD-10-CM | POA: Diagnosis not present

## 2023-05-12 DIAGNOSIS — K219 Gastro-esophageal reflux disease without esophagitis: Secondary | ICD-10-CM | POA: Diagnosis present

## 2023-05-12 DIAGNOSIS — R7881 Bacteremia: Secondary | ICD-10-CM | POA: Diagnosis not present

## 2023-05-12 DIAGNOSIS — T798XXA Other early complications of trauma, initial encounter: Secondary | ICD-10-CM | POA: Diagnosis not present

## 2023-05-12 DIAGNOSIS — Z882 Allergy status to sulfonamides status: Secondary | ICD-10-CM

## 2023-05-12 DIAGNOSIS — S82831A Other fracture of upper and lower end of right fibula, initial encounter for closed fracture: Secondary | ICD-10-CM | POA: Diagnosis not present

## 2023-05-12 DIAGNOSIS — Z955 Presence of coronary angioplasty implant and graft: Secondary | ICD-10-CM

## 2023-05-12 DIAGNOSIS — I5032 Chronic diastolic (congestive) heart failure: Secondary | ICD-10-CM | POA: Diagnosis not present

## 2023-05-12 DIAGNOSIS — S82831S Other fracture of upper and lower end of right fibula, sequela: Secondary | ICD-10-CM | POA: Diagnosis not present

## 2023-05-12 DIAGNOSIS — E66813 Obesity, class 3: Secondary | ICD-10-CM | POA: Diagnosis present

## 2023-05-12 DIAGNOSIS — L89312 Pressure ulcer of right buttock, stage 2: Secondary | ICD-10-CM | POA: Diagnosis not present

## 2023-05-12 DIAGNOSIS — I480 Paroxysmal atrial fibrillation: Secondary | ICD-10-CM | POA: Diagnosis not present

## 2023-05-12 DIAGNOSIS — E785 Hyperlipidemia, unspecified: Secondary | ICD-10-CM | POA: Diagnosis present

## 2023-05-12 DIAGNOSIS — N182 Chronic kidney disease, stage 2 (mild): Secondary | ICD-10-CM | POA: Diagnosis not present

## 2023-05-12 DIAGNOSIS — Z794 Long term (current) use of insulin: Secondary | ICD-10-CM | POA: Diagnosis not present

## 2023-05-12 DIAGNOSIS — J449 Chronic obstructive pulmonary disease, unspecified: Secondary | ICD-10-CM | POA: Diagnosis not present

## 2023-05-12 DIAGNOSIS — Z87891 Personal history of nicotine dependence: Secondary | ICD-10-CM

## 2023-05-12 DIAGNOSIS — M25471 Effusion, right ankle: Secondary | ICD-10-CM | POA: Diagnosis not present

## 2023-05-12 DIAGNOSIS — S82891D Other fracture of right lower leg, subsequent encounter for closed fracture with routine healing: Secondary | ICD-10-CM | POA: Diagnosis not present

## 2023-05-12 DIAGNOSIS — G4733 Obstructive sleep apnea (adult) (pediatric): Secondary | ICD-10-CM | POA: Diagnosis present

## 2023-05-12 DIAGNOSIS — E1165 Type 2 diabetes mellitus with hyperglycemia: Secondary | ICD-10-CM | POA: Diagnosis present

## 2023-05-12 DIAGNOSIS — Z880 Allergy status to penicillin: Secondary | ICD-10-CM

## 2023-05-12 DIAGNOSIS — Z7985 Long-term (current) use of injectable non-insulin antidiabetic drugs: Secondary | ICD-10-CM

## 2023-05-12 DIAGNOSIS — Y92019 Unspecified place in single-family (private) house as the place of occurrence of the external cause: Secondary | ICD-10-CM | POA: Diagnosis not present

## 2023-05-12 DIAGNOSIS — R296 Repeated falls: Secondary | ICD-10-CM | POA: Diagnosis present

## 2023-05-12 DIAGNOSIS — I951 Orthostatic hypotension: Secondary | ICD-10-CM | POA: Diagnosis present

## 2023-05-12 DIAGNOSIS — Z79890 Hormone replacement therapy (postmenopausal): Secondary | ICD-10-CM

## 2023-05-12 DIAGNOSIS — Z95828 Presence of other vascular implants and grafts: Secondary | ICD-10-CM

## 2023-05-12 DIAGNOSIS — S82301A Unspecified fracture of lower end of right tibia, initial encounter for closed fracture: Secondary | ICD-10-CM | POA: Diagnosis not present

## 2023-05-12 DIAGNOSIS — S82891B Other fracture of right lower leg, initial encounter for open fracture type I or II: Secondary | ICD-10-CM | POA: Diagnosis not present

## 2023-05-12 DIAGNOSIS — Z9889 Other specified postprocedural states: Secondary | ICD-10-CM | POA: Diagnosis not present

## 2023-05-12 DIAGNOSIS — E1140 Type 2 diabetes mellitus with diabetic neuropathy, unspecified: Secondary | ICD-10-CM | POA: Diagnosis not present

## 2023-05-12 DIAGNOSIS — I2510 Atherosclerotic heart disease of native coronary artery without angina pectoris: Secondary | ICD-10-CM | POA: Diagnosis present

## 2023-05-12 DIAGNOSIS — I4891 Unspecified atrial fibrillation: Secondary | ICD-10-CM | POA: Diagnosis not present

## 2023-05-12 DIAGNOSIS — Z7901 Long term (current) use of anticoagulants: Secondary | ICD-10-CM

## 2023-05-12 DIAGNOSIS — Z472 Encounter for removal of internal fixation device: Secondary | ICD-10-CM | POA: Diagnosis not present

## 2023-05-12 DIAGNOSIS — S82891S Other fracture of right lower leg, sequela: Secondary | ICD-10-CM | POA: Diagnosis not present

## 2023-05-12 DIAGNOSIS — F419 Anxiety disorder, unspecified: Secondary | ICD-10-CM | POA: Diagnosis present

## 2023-05-12 DIAGNOSIS — S82851E Displaced trimalleolar fracture of right lower leg, subsequent encounter for open fracture type I or II with routine healing: Secondary | ICD-10-CM | POA: Diagnosis not present

## 2023-05-12 DIAGNOSIS — S82401A Unspecified fracture of shaft of right fibula, initial encounter for closed fracture: Secondary | ICD-10-CM | POA: Diagnosis not present

## 2023-05-12 DIAGNOSIS — I130 Hypertensive heart and chronic kidney disease with heart failure and stage 1 through stage 4 chronic kidney disease, or unspecified chronic kidney disease: Secondary | ICD-10-CM | POA: Diagnosis not present

## 2023-05-12 DIAGNOSIS — B9689 Other specified bacterial agents as the cause of diseases classified elsewhere: Secondary | ICD-10-CM | POA: Diagnosis not present

## 2023-05-12 DIAGNOSIS — S82851A Displaced trimalleolar fracture of right lower leg, initial encounter for closed fracture: Secondary | ICD-10-CM | POA: Diagnosis not present

## 2023-05-12 HISTORY — PX: EXTERNAL FIXATION LEG: SHX1549

## 2023-05-12 LAB — CBC
HCT: 27.5 % — ABNORMAL LOW (ref 39.0–52.0)
Hemoglobin: 9 g/dL — ABNORMAL LOW (ref 13.0–17.0)
MCH: 30.2 pg (ref 26.0–34.0)
MCHC: 32.7 g/dL (ref 30.0–36.0)
MCV: 92.3 fL (ref 80.0–100.0)
Platelets: 162 10*3/uL (ref 150–400)
RBC: 2.98 MIL/uL — ABNORMAL LOW (ref 4.22–5.81)
RDW: 16.2 % — ABNORMAL HIGH (ref 11.5–15.5)
WBC: 6.9 10*3/uL (ref 4.0–10.5)
nRBC: 0 % (ref 0.0–0.2)

## 2023-05-12 LAB — BASIC METABOLIC PANEL
Anion gap: 12 (ref 5–15)
BUN: 29 mg/dL — ABNORMAL HIGH (ref 8–23)
CO2: 25 mmol/L (ref 22–32)
Calcium: 8.7 mg/dL — ABNORMAL LOW (ref 8.9–10.3)
Chloride: 101 mmol/L (ref 98–111)
Creatinine, Ser: 3.66 mg/dL — ABNORMAL HIGH (ref 0.61–1.24)
GFR, Estimated: 17 mL/min — ABNORMAL LOW (ref >60)
Glucose, Bld: 229 mg/dL — ABNORMAL HIGH (ref 70–99)
Potassium: 3.8 mmol/L (ref 3.5–5.1)
Sodium: 138 mmol/L (ref 135–145)

## 2023-05-12 LAB — GLUCOSE, CAPILLARY
Glucose-Capillary: 205 mg/dL — ABNORMAL HIGH (ref 70–99)
Glucose-Capillary: 215 mg/dL — ABNORMAL HIGH (ref 70–99)

## 2023-05-12 LAB — PROTIME-INR
INR: 1.5 — ABNORMAL HIGH (ref 0.8–1.2)
Prothrombin Time: 18.5 s — ABNORMAL HIGH (ref 11.4–15.2)

## 2023-05-12 LAB — SURGICAL PCR SCREEN
MRSA, PCR: NEGATIVE
Staphylococcus aureus: NEGATIVE

## 2023-05-12 SURGERY — EXTERNAL FIXATION, LOWER EXTREMITY
Anesthesia: General | Laterality: Right

## 2023-05-12 MED ORDER — INSULIN ASPART 100 UNIT/ML IJ SOLN: 0.0000 [IU] | INTRAMUSCULAR | Status: DC | PRN

## 2023-05-12 MED ORDER — OXYCODONE HCL 5 MG PO TABS
5.0000 mg | ORAL_TABLET | ORAL | Status: DC | PRN
  Administered 2023-05-13 – 2023-05-15 (×7): 5 mg via ORAL
  Filled 2023-05-12 (×7): qty 1

## 2023-05-12 MED ORDER — PHENYLEPHRINE HCL-NACL 20-0.9 MG/250ML-% IV SOLN
INTRAVENOUS | Status: DC | PRN
  Administered 2023-05-12: 30 ug/min via INTRAVENOUS

## 2023-05-12 MED ORDER — VANCOMYCIN HCL 1000 MG IV SOLR
INTRAVENOUS | Status: AC
  Filled 2023-05-12: qty 20

## 2023-05-12 MED ORDER — INSULIN ASPART 100 UNIT/ML IJ SOLN
INTRAMUSCULAR | Status: AC
  Administered 2023-05-12: 4 [IU] via SUBCUTANEOUS
  Filled 2023-05-12: qty 1

## 2023-05-12 MED ORDER — ACETAMINOPHEN 500 MG PO TABS: 500.0000 mg | ORAL_TABLET | Freq: Four times a day (QID) | ORAL | Status: DC | PRN

## 2023-05-12 MED ORDER — COLCHICINE 0.6 MG PO TABS
0.6000 mg | ORAL_TABLET | Freq: Every day | ORAL | Status: DC
  Administered 2023-05-12: 0.6 mg via ORAL
  Filled 2023-05-12 (×2): qty 1

## 2023-05-12 MED ORDER — DOCUSATE SODIUM 100 MG PO CAPS
100.0000 mg | ORAL_CAPSULE | Freq: Two times a day (BID) | ORAL | Status: DC
  Administered 2023-05-13 – 2023-05-21 (×16): 100 mg via ORAL
  Filled 2023-05-12 (×19): qty 1

## 2023-05-12 MED ORDER — SODIUM CHLORIDE 0.9 % IR SOLN
Status: DC | PRN
  Administered 2023-05-12: 3000 mL

## 2023-05-12 MED ORDER — GABAPENTIN 300 MG PO CAPS
300.0000 mg | ORAL_CAPSULE | Freq: Every day | ORAL | Status: DC
  Administered 2023-05-12 – 2023-05-20 (×9): 300 mg via ORAL
  Filled 2023-05-12 (×9): qty 1

## 2023-05-12 MED ORDER — VANCOMYCIN HCL 1000 MG IV SOLR
INTRAVENOUS | Status: DC | PRN
  Administered 2023-05-12: 1000 mg

## 2023-05-12 MED ORDER — CHLORHEXIDINE GLUCONATE 0.12 % MT SOLN: 15.0000 mL | Freq: Once | OROMUCOSAL | Status: AC

## 2023-05-12 MED ORDER — CEFAZOLIN SODIUM-DEXTROSE 2-4 GM/100ML-% IV SOLN
2.0000 g | Freq: Two times a day (BID) | INTRAVENOUS | Status: AC
  Administered 2023-05-13 (×2): 2 g via INTRAVENOUS
  Filled 2023-05-12 (×2): qty 100

## 2023-05-12 MED ORDER — MIDODRINE HCL 5 MG PO TABS
5.0000 mg | ORAL_TABLET | Freq: Three times a day (TID) | ORAL | Status: DC
  Administered 2023-05-12 – 2023-05-18 (×15): 5 mg via ORAL
  Filled 2023-05-12 (×15): qty 1

## 2023-05-12 MED ORDER — LOSARTAN POTASSIUM 25 MG PO TABS: 12.5000 mg | ORAL_TABLET | Freq: Every day | ORAL | Status: DC

## 2023-05-12 MED ORDER — ALLOPURINOL 100 MG PO TABS
50.0000 mg | ORAL_TABLET | Freq: Every day | ORAL | Status: DC
  Administered 2023-05-12 – 2023-05-21 (×10): 50 mg via ORAL
  Filled 2023-05-12 (×10): qty 1

## 2023-05-12 MED ORDER — MORPHINE SULFATE (PF) 2 MG/ML IV SOLN
2.0000 mg | Freq: Four times a day (QID) | INTRAVENOUS | Status: DC | PRN
  Administered 2023-05-12: 2 mg via INTRAVENOUS
  Filled 2023-05-12: qty 1

## 2023-05-12 MED ORDER — ENOXAPARIN SODIUM 30 MG/0.3ML IJ SOSY: 30.0000 mg | PREFILLED_SYRINGE | INTRAMUSCULAR | Status: DC

## 2023-05-12 MED ORDER — DULOXETINE HCL 60 MG PO CPEP
60.0000 mg | ORAL_CAPSULE | Freq: Every day | ORAL | Status: DC
Start: 2023-05-12 — End: 2023-05-21
  Administered 2023-05-12 – 2023-05-21 (×10): 60 mg via ORAL
  Filled 2023-05-12 (×10): qty 1

## 2023-05-12 MED ORDER — METOCLOPRAMIDE HCL 5 MG PO TABS: 5.0000 mg | ORAL_TABLET | Freq: Three times a day (TID) | ORAL | Status: DC | PRN

## 2023-05-12 MED ORDER — SODIUM CHLORIDE 0.9% FLUSH
3.0000 mL | Freq: Two times a day (BID) | INTRAVENOUS | Status: DC
Start: 2023-05-12 — End: 2023-05-21
  Administered 2023-05-12: 3 mL via INTRAVENOUS
  Administered 2023-05-13: 10 mL via INTRAVENOUS
  Administered 2023-05-16: 3 mL via INTRAVENOUS
  Administered 2023-05-16: 5 mL via INTRAVENOUS
  Administered 2023-05-17 – 2023-05-18 (×2): 10 mL via INTRAVENOUS
  Administered 2023-05-19: 3 mL via INTRAVENOUS
  Administered 2023-05-20 (×2): 10 mL via INTRAVENOUS

## 2023-05-12 MED ORDER — HYDROMORPHONE HCL 1 MG/ML IJ SOLN
1.0000 mg | Freq: Once | INTRAMUSCULAR | Status: AC
  Administered 2023-05-12: 1 mg via INTRAVENOUS
  Filled 2023-05-12: qty 1

## 2023-05-12 MED ORDER — ALBUMIN HUMAN 5 % IV SOLN: INTRAVENOUS | Status: DC | PRN

## 2023-05-12 MED ORDER — AMIODARONE HCL 200 MG PO TABS
200.0000 mg | ORAL_TABLET | Freq: Two times a day (BID) | ORAL | Status: DC
  Administered 2023-05-12 – 2023-05-21 (×18): 200 mg via ORAL
  Filled 2023-05-12 (×19): qty 1

## 2023-05-12 MED ORDER — 0.9 % SODIUM CHLORIDE (POUR BTL) OPTIME
TOPICAL | Status: DC | PRN
  Administered 2023-05-12: 1000 mL

## 2023-05-12 MED ORDER — PROPOFOL 10 MG/ML IV BOLUS
INTRAVENOUS | Status: AC
  Filled 2023-05-12: qty 20

## 2023-05-12 MED ORDER — LEVOTHYROXINE SODIUM 88 MCG PO TABS
88.0000 ug | ORAL_TABLET | Freq: Every day | ORAL | Status: DC
  Administered 2023-05-13 – 2023-05-21 (×9): 88 ug via ORAL
  Filled 2023-05-12 (×9): qty 1

## 2023-05-12 MED ORDER — FENTANYL CITRATE (PF) 250 MCG/5ML IJ SOLN
INTRAMUSCULAR | Status: AC
  Filled 2023-05-12: qty 5

## 2023-05-12 MED ORDER — METHOCARBAMOL 1000 MG/10ML IJ SOLN: 500.0000 mg | Freq: Four times a day (QID) | INTRAMUSCULAR | Status: DC | PRN

## 2023-05-12 MED ORDER — MIDAZOLAM HCL 2 MG/2ML IJ SOLN
INTRAMUSCULAR | Status: AC
  Filled 2023-05-12: qty 2

## 2023-05-12 MED ORDER — CEFAZOLIN SODIUM-DEXTROSE 2-4 GM/100ML-% IV SOLN
2.0000 g | Freq: Once | INTRAVENOUS | Status: AC
Start: 2023-05-12 — End: 2023-05-12
  Administered 2023-05-12: 2 g via INTRAVENOUS
  Filled 2023-05-12: qty 100

## 2023-05-12 MED ORDER — CEFAZOLIN SODIUM-DEXTROSE 1-4 GM/50ML-% IV SOLN: 1.0000 g | Freq: Once | INTRAVENOUS | Status: DC

## 2023-05-12 MED ORDER — NITROGLYCERIN 0.4 MG SL SUBL: 0.4000 mg | SUBLINGUAL_TABLET | SUBLINGUAL | Status: DC | PRN

## 2023-05-12 MED ORDER — METOCLOPRAMIDE HCL 5 MG/ML IJ SOLN
5.0000 mg | Freq: Three times a day (TID) | INTRAMUSCULAR | Status: DC | PRN
  Administered 2023-05-13: 10 mg via INTRAVENOUS
  Filled 2023-05-12: qty 2

## 2023-05-12 MED ORDER — ORAL CARE MOUTH RINSE: 15.0000 mL | Freq: Once | OROMUCOSAL | Status: AC

## 2023-05-12 MED ORDER — EZETIMIBE 10 MG PO TABS
10.0000 mg | ORAL_TABLET | Freq: Every day | ORAL | Status: DC
  Administered 2023-05-12 – 2023-05-21 (×10): 10 mg via ORAL
  Filled 2023-05-12 (×10): qty 1

## 2023-05-12 MED ORDER — ACETAMINOPHEN 650 MG RE SUPP: 650.0000 mg | Freq: Four times a day (QID) | RECTAL | Status: DC | PRN

## 2023-05-12 MED ORDER — LIDOCAINE 2% (20 MG/ML) 5 ML SYRINGE
INTRAMUSCULAR | Status: DC | PRN
  Administered 2023-05-12: 60 mg via INTRAVENOUS

## 2023-05-12 MED ORDER — VASOPRESSIN 20 UNIT/ML IV SOLN
INTRAVENOUS | Status: AC
  Filled 2023-05-12: qty 1

## 2023-05-12 MED ORDER — ATORVASTATIN CALCIUM 40 MG PO TABS
40.0000 mg | ORAL_TABLET | Freq: Every day | ORAL | Status: DC
  Administered 2023-05-12 – 2023-05-21 (×10): 40 mg via ORAL
  Filled 2023-05-12 (×10): qty 1

## 2023-05-12 MED ORDER — SODIUM CHLORIDE 0.9% FLUSH: 3.0000 mL | INTRAVENOUS | Status: DC | PRN

## 2023-05-12 MED ORDER — FENTANYL CITRATE (PF) 250 MCG/5ML IJ SOLN
INTRAMUSCULAR | Status: DC | PRN
  Administered 2023-05-12 (×2): 50 ug via INTRAVENOUS

## 2023-05-12 MED ORDER — HYDROMORPHONE HCL 1 MG/ML IJ SOLN
INTRAMUSCULAR | Status: AC
  Filled 2023-05-12: qty 1

## 2023-05-12 MED ORDER — DOCUSATE SODIUM 100 MG PO CAPS: 100.0000 mg | ORAL_CAPSULE | Freq: Two times a day (BID) | ORAL | Status: DC

## 2023-05-12 MED ORDER — PHENYLEPHRINE HCL-NACL 20-0.9 MG/250ML-% IV SOLN
INTRAVENOUS | Status: AC
  Filled 2023-05-12: qty 250

## 2023-05-12 MED ORDER — HYDRALAZINE HCL 20 MG/ML IJ SOLN
10.0000 mg | Freq: Four times a day (QID) | INTRAMUSCULAR | Status: DC | PRN
  Filled 2023-05-12: qty 1

## 2023-05-12 MED ORDER — PROPOFOL 10 MG/ML IV BOLUS
INTRAVENOUS | Status: DC | PRN
  Administered 2023-05-12: 100 mg via INTRAVENOUS

## 2023-05-12 MED ORDER — HYDROMORPHONE HCL 1 MG/ML IJ SOLN
0.2500 mg | INTRAMUSCULAR | Status: DC | PRN
  Administered 2023-05-12: 0.5 mg via INTRAVENOUS

## 2023-05-12 MED ORDER — CHLORHEXIDINE GLUCONATE 0.12 % MT SOLN
OROMUCOSAL | Status: AC
  Administered 2023-05-12: 15 mL via OROMUCOSAL
  Filled 2023-05-12: qty 15

## 2023-05-12 MED ORDER — SODIUM CHLORIDE 0.9% FLUSH
3.0000 mL | Freq: Two times a day (BID) | INTRAVENOUS | Status: DC
  Administered 2023-05-12 – 2023-05-21 (×15): 3 mL via INTRAVENOUS

## 2023-05-12 MED ORDER — VASOPRESSIN 20 UNIT/ML IV SOLN
INTRAVENOUS | Status: DC | PRN
  Administered 2023-05-12: 1 [IU] via INTRAVENOUS

## 2023-05-12 MED ORDER — DEXTROSE 5 % IV SOLN
INTRAVENOUS | Status: DC | PRN
  Administered 2023-05-12: 3 g via INTRAVENOUS

## 2023-05-12 MED ORDER — PHENYLEPHRINE HCL (PRESSORS) 10 MG/ML IV SOLN
INTRAVENOUS | Status: DC | PRN
  Administered 2023-05-12 (×2): 80 ug via INTRAVENOUS
  Administered 2023-05-12: 160 ug via INTRAVENOUS

## 2023-05-12 MED ORDER — POLYETHYLENE GLYCOL 3350 17 G PO PACK: 17.0000 g | PACK | Freq: Every day | ORAL | Status: DC | PRN

## 2023-05-12 MED ORDER — SODIUM CHLORIDE 0.9% FLUSH
3.0000 mL | Freq: Two times a day (BID) | INTRAVENOUS | Status: DC
  Administered 2023-05-12 – 2023-05-13 (×3): 10 mL via INTRAVENOUS

## 2023-05-12 MED ORDER — SODIUM CHLORIDE 0.9 % IV SOLN: INTRAVENOUS | Status: DC

## 2023-05-12 MED ORDER — ONDANSETRON HCL 4 MG/2ML IJ SOLN
4.0000 mg | Freq: Once | INTRAMUSCULAR | Status: AC
  Administered 2023-05-12: 4 mg via INTRAVENOUS

## 2023-05-12 MED ORDER — HYDROMORPHONE HCL 1 MG/ML IJ SOLN
0.5000 mg | INTRAMUSCULAR | Status: DC | PRN
  Administered 2023-05-14: 1 mg via INTRAVENOUS
  Filled 2023-05-12: qty 1

## 2023-05-12 MED ORDER — ACETAMINOPHEN 325 MG PO TABS
650.0000 mg | ORAL_TABLET | Freq: Four times a day (QID) | ORAL | Status: DC | PRN
  Administered 2023-05-15 (×2): 650 mg via ORAL
  Filled 2023-05-12 (×2): qty 2

## 2023-05-12 MED ORDER — METHOCARBAMOL 500 MG PO TABS
500.0000 mg | ORAL_TABLET | Freq: Four times a day (QID) | ORAL | Status: DC | PRN
  Administered 2023-05-12 – 2023-05-19 (×9): 500 mg via ORAL
  Filled 2023-05-12 (×10): qty 1

## 2023-05-12 SURGICAL SUPPLY — 50 items
BAG COUNTER SPONGE SURGICOUNT (BAG) ×1 IMPLANT
BAR GLASS FIBER EXFX 11X350 (EXFIX) IMPLANT
BIT DRILL CANN 4.0MM (BIT) ×1 IMPLANT
BIT DRILL CANN MED FLUTE 4.0 (BIT) IMPLANT
BNDG COHESIVE 4X5 TAN STRL LF (GAUZE/BANDAGES/DRESSINGS) ×1 IMPLANT
BNDG ELASTIC 4INX 5YD STR LF (GAUZE/BANDAGES/DRESSINGS) IMPLANT
BNDG ELASTIC 4X5.8 VLCR STR LF (GAUZE/BANDAGES/DRESSINGS) ×1 IMPLANT
BNDG ELASTIC 6INX 5YD STR LF (GAUZE/BANDAGES/DRESSINGS) ×1 IMPLANT
BNDG GAUZE DERMACEA FLUFF 4 (GAUZE/BANDAGES/DRESSINGS) ×2 IMPLANT
BRUSH SCRUB EZ PLAIN DRY (MISCELLANEOUS) ×2 IMPLANT
CHLORAPREP W/TINT 26 (MISCELLANEOUS) ×1 IMPLANT
CLAMP BLUE BAR TO PIN (EXFIX) IMPLANT
COVER SURGICAL LIGHT HANDLE (MISCELLANEOUS) ×2 IMPLANT
DRAPE C-ARM 42X72 X-RAY (DRAPES) IMPLANT
DRAPE C-ARMOR (DRAPES) ×1 IMPLANT
DRAPE IMP U-DRAPE 54X76 (DRAPES) ×2 IMPLANT
DRAPE SURG ORHT 6 SPLT 77X108 (DRAPES) ×2 IMPLANT
DRAPE U-SHAPE 47X51 STRL (DRAPES) ×1 IMPLANT
DRILL CANN 4.0MM (BIT) ×1 IMPLANT
DRSG MEPITEL 4X7.2 (GAUZE/BANDAGES/DRESSINGS) IMPLANT
ELECT REM PT RETURN 9FT ADLT (ELECTROSURGICAL) ×1 IMPLANT
ELECTRODE REM PT RTRN 9FT ADLT (ELECTROSURGICAL) ×1 IMPLANT
GAUZE SPONGE 4X4 12PLY STRL (GAUZE/BANDAGES/DRESSINGS) ×1 IMPLANT
GLOVE BIO SURGEON STRL SZ 6.5 (GLOVE) ×3 IMPLANT
GLOVE BIO SURGEON STRL SZ7.5 (GLOVE) ×4 IMPLANT
GLOVE BIOGEL PI IND STRL 6.5 (GLOVE) ×1 IMPLANT
GLOVE BIOGEL PI IND STRL 7.5 (GLOVE) ×1 IMPLANT
GOWN STRL REUS W/ TWL LRG LVL3 (GOWN DISPOSABLE) ×2 IMPLANT
KIT BASIN OR (CUSTOM PROCEDURE TRAY) ×1 IMPLANT
KIT TURNOVER KIT B (KITS) ×1 IMPLANT
MANIFOLD NEPTUNE II (INSTRUMENTS) ×1 IMPLANT
NDL 22X1.5 STRL (OR ONLY) (MISCELLANEOUS) IMPLANT
NEEDLE 22X1.5 STRL (OR ONLY) (MISCELLANEOUS) IMPLANT
NS IRRIG 1000ML POUR BTL (IV SOLUTION) ×1 IMPLANT
PACK ORTHO EXTREMITY (CUSTOM PROCEDURE TRAY) ×1 IMPLANT
PAD ARMBOARD POSITIONER FOAM (MISCELLANEOUS) ×2 IMPLANT
PAD CAST CTTN 4X4 STRL (SOFTGOODS) IMPLANT
PADDING CAST COTTON 6X4 STRL (CAST SUPPLIES) ×2 IMPLANT
PIN BLUNT XTRFIX LG 5X160X55MM (EXFIX) IMPLANT
PIN CLAMP 2BAR 75MM BLUE (EXFIX) IMPLANT
PIN TRANSFIXING 5.0 (EXFIX) IMPLANT
SET CYSTO W/LG BORE CLAMP LF (SET/KITS/TRAYS/PACK) IMPLANT
SPONGE T-LAP 18X18 ~~LOC~~+RFID (SPONGE) ×1 IMPLANT
STAPLER VISISTAT 35W (STAPLE) ×1 IMPLANT
STRIP CLOSURE SKIN 1/2X4 (GAUZE/BANDAGES/DRESSINGS) IMPLANT
SUT ETHILON 3 0 PS 1 (SUTURE) IMPLANT
SUT PROLENE 0 CT (SUTURE) IMPLANT
TOWEL GREEN STERILE (TOWEL DISPOSABLE) ×2 IMPLANT
TOWEL GREEN STERILE FF (TOWEL DISPOSABLE) ×2 IMPLANT
UNDERPAD 30X36 HEAVY ABSORB (UNDERPADS AND DIAPERS) ×1 IMPLANT

## 2023-05-12 NOTE — ED Triage Notes (Signed)
 Pt BIB [facility] from home due to right ankle injury.  Pt was trying to get up from recliner and slipped out.  Right ankle open fracture.  Pt was discharged yesterday after staying in hospital for 44 days.  Pt does take Plavix and Eliquis.  20g right hand.  Fentanyl given en route. VS BP 136/76, HR 76, Resp 26, SpO2 100%, CBG 235

## 2023-05-12 NOTE — Assessment & Plan Note (Addendum)
 Vitals:   05/12/23 0942 05/12/23 1000 05/12/23 1045 05/12/23 1145  BP: (!) 135/55 (!) 152/61 (!) 162/141 (!) 169/48  Home regimen has yet to be verified and until then we will start patient on as needed hydralazine.

## 2023-05-12 NOTE — Assessment & Plan Note (Addendum)
 2/2 to nontraumatic fibular fracture. Orthopedic has been consulted.  Surgery planned and npo after MN. PRN morphine x 3 doses.

## 2023-05-12 NOTE — Assessment & Plan Note (Addendum)
 Continue patient on his Cymbalta.  Although will defer to outpatient provider for different options as Cymbalta can precipitate and worsen blood pressure fluctuation and orthostatic changes especially as patient is currently on midodrine.

## 2023-05-12 NOTE — ED Provider Notes (Signed)
 [place] EMERGENCY DEPARTMENT AT [facility] Provider Note   CSN: [id number] Arrival date & time: 05/12/23  0932     History No chief complaint on file.   Harold [name] is a 68 y.o. male with medical history of open ankle fracture in November 2024, atrial fibrillation chronic anticoagulated, diabetic neuropathy, pulmonary hypertension, venous stasis of bilateral lower extremities with ulcers.  The patient presents to the ED for fall.  States that this morning around 8 AM he stood up out of his recliner and his right leg "gave out".  The patient has an open fracture on exam.  He reports that he is anticoagulated chronically for his atrial fibrillation.  Apparently patient just was discharged from the hospital 2 days ago on 3/22.  Patient was admitted for 44 days.  Patient was initially seen back in November 2024 where he was noted to have a type IIIa open right trimalleolar ankle fracture as well as an ankle dislocation with syndesmotic disruption.  The patient had an open reduction internal fixation of his right ankle syndesmosis at that time as well as an ORIF of his right trimalleolar ankle fracture with fixation of the posterior lip.  This was done by Dr. Carola Frost.  Patient returned to the ED on 2/6 where he was noted to have a postsurgical right ankle wound infection.  The patient had right ankle hardware removed, incision and drainage conducted and a wound VAC was placed on 2/10 by Dr. Jena Gauss.  Patient had a repeat debridement of his right ankle by Dr. Lajoyce Corners on 2/12.  ID was consulted and the patient was initially started on cefepime however he was subsequently transitioned to daptomycin and ertapenem and eventually antibiotics were held entirely due to patient worsening renal function.  ID signed off on 3/11 and advised no further antibiotics needed.  Patient denies striking his head during the fall today.  Reports he lowered himself to the ground.  Denies preceding chest pain or  shortness of breath.  HPI     Home Medications Prior to Admission medications   Medication Sig Start Date End Date Taking? Authorizing Provider  acetaminophen (TYLENOL) 500 MG tablet Take 1 tablet (500 mg total) by mouth every 6 (six) hours as needed for mild pain (pain score 1-3). 01/07/23  Yes Hongalgi, Maximino Greenland, MD  allopurinol (ZYLOPRIM) 100 MG tablet Take 0.5 tablets (50 mg total) by mouth daily. 05/10/23  Yes Hughie Closs, MD  amiodarone (PACERONE) 200 MG tablet Take 1 tablet (200 mg total) by mouth 2 (two) times daily. 05/10/23  Yes Hughie Closs, MD  apixaban (ELIQUIS) 5 MG TABS tablet TAKE 1 TABLET BY MOUTH TWICE A DAY 02/17/23  Yes Marykay Lex, MD  atorvastatin (LIPITOR) 40 MG tablet Take 1 tablet (40 mg total) by mouth daily. 05/10/23 06/09/23 Yes Pahwani, Daleen Bo, MD  clopidogrel (PLAVIX) 75 MG tablet Take 75 mg by mouth every morning. 02/04/23  Yes [provider]  colchicine 0.6 MG tablet Take 0.6 mg by mouth daily.   Yes [provider]  DULoxetine (CYMBALTA) 60 MG capsule Take 60 mg by mouth daily.   Yes [provider]  ezetimibe (ZETIA) 10 MG tablet Take 10 mg by mouth daily. 05/09/22  Yes [provider]  furosemide (LASIX) 80 MG tablet Take 0.5 tablets (40 mg total) by mouth daily. May take additional tablet for weight gain of 3 pounds in one day or five pounds in one week Patient taking differently: Take 160 mg by  mouth 2 (two) times daily. May take additional tablet for weight gain of 3 pounds in one day or five pounds in one week 01/14/23  Yes [place], Alvira Philips, DO  gabapentin (NEURONTIN) 300 MG capsule Take 1 capsule (300 mg total) by mouth at bedtime. 05/10/23  Yes Pahwani, Daleen Bo, MD  HYDROcodone bit-homatropine (HYCODAN) 5-1.5 MG/5ML syrup Take 5 mLs by mouth every 6 (six) hours as needed for cough. 04/06/23  Yes [provider]  levothyroxine (SYNTHROID, LEVOTHROID) 88 MCG tablet Take 88 mcg by mouth daily before breakfast.   Yes  [provider]  loratadine (CLARITIN) 10 MG tablet Take 1 tablet (10 mg total) by mouth daily. 01/15/23  Yes [place], Eric J, DO  losartan (COZAAR) 25 MG tablet Take 12.5-25 mg by mouth daily. 04/07/23  Yes [provider]  midodrine (PROAMATINE) 5 MG tablet Take 1 tablet (5 mg total) by mouth 3 (three) times daily with meals. 05/10/23 06/09/23 Yes Hughie Closs, MD  Multiple Vitamin (MULTIVITAMIN WITH MINERALS) TABS tablet Take 1 tablet by mouth daily. 01/08/23  Yes Hongalgi, Maximino Greenland, MD  pantoprazole (PROTONIX) 40 MG tablet TAKE 1 TABLET BY MOUTH EVERY DAY 12/13/22  Yes Dunn, Dayna N, PA-C  SODIUM FLUORIDE 5000 PPM 1.1 % PSTE Take 1 Application by mouth in the morning and at bedtime. 02/17/23  Yes [provider]  tamsulosin (FLOMAX) 0.4 MG CAPS capsule Take 1 capsule (0.4 mg total) by mouth daily. 05/10/23 06/09/23 Yes Pahwani, Daleen Bo, MD  tirzepatide [facility]) 15 MG/0.5ML Pen Inject 15 mg into the skin once a week. Patient taking differently: Inject 15 mg into the skin once a week. On Tuesdays 12/30/22  Yes   traZODone (DESYREL) 100 MG tablet Take 100 mg by mouth at bedtime. 03/04/23  Yes [provider]  TRESIBA FLEXTOUCH 200 UNIT/ML FlexTouch Pen Inject 28 Units into the skin 2 (two) times daily. 05/10/23 06/09/23 Yes Pahwani, Daleen Bo, MD  benzonatate (TESSALON) 200 MG capsule Take 1 capsule (200 mg total) by mouth 3 (three) times daily as needed for cough. Patient not taking: Reported on 05/12/2023 04/10/23   Jerald Kief, MD  nitroGLYCERIN (NITROSTAT) 0.4 MG SL tablet PLACE 1 TAB UNDER THE TONGUE EVERY 5 MINUTES AS NEEDED FOR CHEST PAIN (SEVERE PRESSURE OR TIGHTNESS) Patient taking differently: Place 0.4 mg under the tongue every 5 (five) minutes as needed for chest pain. PLACE 1 TAB UNDER THE TONGUE EVERY 5 MINUTES AS NEEDED FOR CHEST PAIN (SEVERE PRESSURE OR TIGHTNESS) 02/22/19   Marykay Lex, MD  PRESCRIPTION MEDICATION Pt uses CPAP Machine at bedtime     [provider]      Allergies    Doxycycline, Septra [bactrim], Penicillins, Metformin and related, Other, Sulfamethoxazole-trimethoprim, and Wellbutrin [bupropion]    Review of Systems   Review of Systems  Musculoskeletal:  Positive for arthralgias and myalgias.  All other systems reviewed and are negative.   Physical Exam Updated Vital Signs BP (!) 169/48   Pulse 74   Temp 98.1 F (36.7 C) (Oral)   Resp 16   Ht 6\' 1"  (1.854 m)   Wt 136.1 kg   SpO2 100%   BMI 39.58 kg/m  Physical Exam Vitals and nursing note reviewed.  Constitutional:      General: He is not in acute distress.    Appearance: He is well-developed.  HENT:     Head: Normocephalic and atraumatic.  Eyes:     Conjunctiva/sclera: Conjunctivae normal.  Cardiovascular:     Rate and  Rhythm: Normal rate and regular rhythm.     Heart sounds: No murmur heard. Pulmonary:     Effort: Pulmonary effort is normal. No respiratory distress.     Breath sounds: Normal breath sounds.  Abdominal:     Palpations: Abdomen is soft.     Tenderness: There is no abdominal tenderness.  Musculoskeletal:        General: No swelling.     Cervical back: Neck supple.     Comments: Obvious deformity of the right ankle.  Patient also has what appears to be open fracture to the medial side of his right ankle.  DP pulse appreciated with Doppler ultrasound.  Bloodsoaked bandaging present.  Skin:    General: Skin is warm and dry.     Capillary Refill: Capillary refill takes less than 2 seconds.  Neurological:     Mental Status: He is alert.  Psychiatric:        Mood and Affect: Mood normal.        ED Results / Procedures / Treatments   Labs (all labs ordered are listed, but only abnormal results are displayed) Labs Reviewed  CBC - Abnormal; Notable for the following components:      Result Value   RBC 2.98 (*)    Hemoglobin 9.0 (*)    HCT 27.5 (*)    RDW 16.2 (*)    All other components within normal limits   BASIC METABOLIC PANEL - Abnormal; Notable for the following components:   Glucose, Bld 229 (*)    BUN 29 (*)    Creatinine, Ser 3.66 (*)    Calcium 8.7 (*)    GFR, Estimated 17 (*)    All other components within normal limits  PROTIME-INR - Abnormal; Notable for the following components:   Prothrombin Time 18.5 (*)    INR 1.5 (*)    All other components within normal limits  MAGNESIUM    EKG None  Radiology DG Ankle Complete Right Result Date: 05/12/2023 CLINICAL DATA:  sp fall, open fracture; sp fall with open fracture. EXAM: RIGHT ANKLE - COMPLETE 3+ VIEW; RIGHT TIBIA AND FIBULA - 2 VIEW COMPARISON:  None Available. FINDINGS: There is comminuted and displaced fracture of the lower diaphysis of the fibula. There are ghost tracks in the fibula from previously removed hardware. There is faint ossific density surrounding the fracture site which may represent underlying heterotopic ossification. There are also age indeterminate fractures of the medial malleolus and posterior malleolus. No other acute fracture or dislocation. No aggressive osseous lesion. Ankle mortise appears intact. Mild-to-moderate diffuse soft tissue swelling overlying the medial malleolus. Vascular stent noted along the lower third femur. No radiopaque foreign bodies. IMPRESSION: *Comminuted and displaced fracture of the lower diaphysis of the fibula. *Age indeterminate fractures of the medial and posterior malleoli. Electronically Signed   By: Jules Schick M.D.   On: 05/12/2023 12:01   DG Tibia/Fibula Right Result Date: 05/12/2023 CLINICAL DATA:  sp fall, open fracture; sp fall with open fracture. EXAM: RIGHT ANKLE - COMPLETE 3+ VIEW; RIGHT TIBIA AND FIBULA - 2 VIEW COMPARISON:  None Available. FINDINGS: There is comminuted and displaced fracture of the lower diaphysis of the fibula. There are ghost tracks in the fibula from previously removed hardware. There is faint ossific density surrounding the fracture site which  may represent underlying heterotopic ossification. There are also age indeterminate fractures of the medial malleolus and posterior malleolus. No other acute fracture or dislocation. No aggressive osseous lesion. Ankle mortise appears  intact. Mild-to-moderate diffuse soft tissue swelling overlying the medial malleolus. Vascular stent noted along the lower third femur. No radiopaque foreign bodies. IMPRESSION: *Comminuted and displaced fracture of the lower diaphysis of the fibula. *Age indeterminate fractures of the medial and posterior malleoli. Electronically Signed   By: Jules Schick M.D.   On: 05/12/2023 12:01    Procedures Procedures    Medications Ordered in ED Medications  ondansetron (ZOFRAN) injection 4 mg (has no administration in time range)  sodium chloride flush (NS) 0.9 % injection 3 mL (has no administration in time range)  acetaminophen (TYLENOL) tablet 650 mg (has no administration in time range)    Or  acetaminophen (TYLENOL) suppository 650 mg (has no administration in time range)  morphine (PF) 2 MG/ML injection 2 mg (has no administration in time range)  sodium chloride flush (NS) 0.9 % injection 3-10 mL (has no administration in time range)  sodium chloride flush (NS) 0.9 % injection 3-10 mL (has no administration in time range)  hydrALAZINE (APRESOLINE) injection 10 mg (has no administration in time range)  docusate sodium (COLACE) capsule 100 mg (has no administration in time range)  amiodarone (PACERONE) tablet 200 mg (has no administration in time range)  allopurinol (ZYLOPRIM) tablet 50 mg (has no administration in time range)  atorvastatin (LIPITOR) tablet 40 mg (has no administration in time range)  gabapentin (NEURONTIN) capsule 300 mg (has no administration in time range)  midodrine (PROAMATINE) tablet 5 mg (has no administration in time range)  nitroGLYCERIN (NITROSTAT) SL tablet 0.4 mg (has no administration in time range)  levothyroxine (SYNTHROID) tablet  88 mcg (has no administration in time range)  DULoxetine (CYMBALTA) DR capsule 60 mg (has no administration in time range)  colchicine tablet 0.6 mg (has no administration in time range)  ezetimibe (ZETIA) tablet 10 mg (has no administration in time range)  insulin aspart (novoLOG) injection 0-6 Units (has no administration in time range)  ceFAZolin (ANCEF) IVPB 2g/100 mL premix (0 g Intravenous Stopped 05/12/23 1143)  HYDROmorphone (DILAUDID) injection 1 mg (1 mg Intravenous Given 05/12/23 1000)  HYDROmorphone (DILAUDID) injection 1 mg (1 mg Intravenous Given 05/12/23 1155)    ED Course/ Medical Decision Making/ A&P Clinical Course as of 05/12/23 1438  Mon May 12, 2023  0953 1. OPEN REDUCTION INTERNAL FIXATION OF LEFT TRIMALLEOLAR ANKLE FRACTURE WITH FIXATION OF THE POSTERIOR LIP 2. OPEN TREATMENT OF ANKLE DISLOCATION WITH PINNING OF THE TIBIOTALAR (ANKLE) AND SUBTALAR JOINTS  3. DEBRIDEMENT OF OPEN FRACTURE INCLUDING BONE RIGHT ANKLE 4. OPEN REDUCTION INTERNAL FIXATION OF RIGHT ANKLE SYNDESMOSIS 5. LIGATION OF SAPHENOUS VEIN  [CG]  [id number] Dr. Carola Frost [CG]  [address] out at bedside, place in splint, admit to medicine [CG]    Clinical Course User Index [CG] Al Decant, PA-C   Medical Decision Making Amount and/or Complexity of Data Reviewed Labs: ordered. Radiology: ordered.  Risk Prescription drug management.   68 year old male presents for evaluation.  Please see HPI for further details.  On examination the patient right lower extremity is an obviously deformed right ankle.  There is a bloodsoaked bandage present.  He has a DP pulse appreciated with Doppler ultrasound.  Has what appears to be an open fracture with open wound to the medial portion of right ankle.  Patient started on 2 g of Ancef.  Patient recently had ORIF of right lower extremity so concern for reinjury.  Will collect x-ray imaging, labs.  Pain controlled with Dilaudid.  CBC without leukocytosis, baseline  hemoglobin 9.  Metabolic panel with creatinine 3.66, GFR 17, anion gap 12.  PT/INR appropriately elevated.  X-ray of June shows a comminuted and displaced fracture of the lower diaphysis of the fibula.  Also age-indeterminate fractures of medial and posterior malleoli.  Discussed with Dale Pinson, PA-C who was advised admitting to medicine, most likely will be taken to the OR in the morning.  Discussed with Dr. Allena Katz [facility] was agreed to admit the patient.  Patient stable at time of admission.   Final Clinical Impression(s) / ED Diagnoses Final diagnoses:  Displaced physeal fracture of distal end of right fibula, sequela    Rx / DC Orders ED Discharge Orders     None         Al Decant, PA-C 05/12/23 1438    Estelle June A, DO 05/14/23 1139

## 2023-05-12 NOTE — H&P (Signed)
 History and Physical    Patient: Harold [name] JXB:[id number] DOB: 08/09/1955 DOA: 05/12/2023 DOS: the patient was seen and examined on 05/12/2023 PCP: [name], MD  Patient coming from: Home Chief complaint: Right ankle pain.   HPI:  Harold [name] is a 68 y.o. male with past medical history  of  morbid obesity, OSA, DM2, HTN, HLD, CAD/stent, A-fib on Eliquis, carotid artery stenosis, CKD, chronic anemia, anxiety/depression, diabetic neuropathy, GERD, hypothyroidism, COPD, pulmonary hypertension,  Dialysis dependent AKI on CKD 3, --  coming for right ankle pain today morning as he tried to get up from a sitting position to a standing position.   Patient was admitted in November and discharged 3/22/204 when he was originally admitted for respiratory failure found to be secondary to influenza A and where he underwent ORIF of the right ankle, complicated by postsurgical wound infection with subsequent S/p right ankle hardware removal, I&D and wound VAC placement on 2/10 by Dr. Jena Gauss and  repeat debridement of the right ankle by Dr. Lajoyce Corners 04/02/2023. -ID was consulted, initially on cefepime, subsequently on daptomycin and ertapenem and completed antibiotic therapy.  Patient also has a history of AKI on CKD stage IIIb requiring hemodialysis.  Please refer to Dr. Ludwig Clarks detailed discharge summary.   >>ED Course: Pt in ed is seen by ortho emergently for his ankle and placed in leg splint.   >>Vitals since initial triage have been stable blood pressures elevated at 169 systolic secondary to pain.  Patient is afebrile and oxygenating 100% on room air Vitals:   05/12/23 1000 05/12/23 1005 05/12/23 1045 05/12/23 1145  BP: (!) 152/61  (!) 162/141 (!) 169/48  Pulse: 65 65 66 74  Temp:      Resp:  17 17 16   Height:      Weight:      SpO2: 100% 100% 100% 100%  TempSrc:      BMI (Calculated):       >>Labs were notable for the following: BMP showing glucose 229 BUN of 29 creatinine 3.66  with a EGFR of 17 putting patient at stage IV. Cbc shows anemia that is chronic with hb of 9.0.  >>EKG: Independently reviewed: sinus rhythm 66 with a left bundle branch block QRS of 121 PR 187 and a QTc of 46, nonspecific ST changes in leads II and V5 V6 otherwise no ST elevation or T wave inversions.  Deep S wave in  V2 and V3  and notched r wave in lat leadscompared to patient's previous EKG prolonged QTc is resolved and QRS was 116. Echo done in February 2025 shows ejection fraction of 55 to 60%, left ventricle demonstrates regional wall motion abnormality with basal inferior hypokinesis, grade 2 diastolic dysfunction, biatrial dilatation.  >>Imaging and additional notable ED work-up:  Comminuted displaced fracture lower diaphysis of fibula.  >>While in the ED patient received the following: Medications  ceFAZolin (ANCEF) IVPB 2g/100 mL premix (0 g Intravenous Stopped 05/12/23 1143)  HYDROmorphone (DILAUDID) injection 1 mg (1 mg Intravenous Given 05/12/23 1000)  HYDROmorphone (DILAUDID) injection 1 mg (1 mg Intravenous Given 05/12/23 1155)   Review of Systems  Unable to perform ROS: Acuity of condition  Musculoskeletal:  Positive for falls and joint pain.  Neurological:  Positive for weakness.   Past Medical History:  Diagnosis Date   Anemia    Anginal pain (HCC) 04/2013   Cardiac cath showed patent stents with distal LAD, circumflex-OM and RCA disease in small vessels.   Anxiety  Atrial fibrillation (HCC) 12/2021   CAD S/P percutaneous coronary angioplasty 2003, 04/2012   status post PCI to LAD, circumflex-OM 2, RCA   Carotid artery occlusion    Chronic renal insufficiency, stage II (mild)    Chronic venous insufficiency    varicosities, no reflux; dopplers 04/14/12- valvular insufficiency in the R and L GSV   Complication of anesthesia    COPD, mild (HCC)    Depression    situaltional    Diabetes mellitus    Diabetic neuropathy (HCC) 08/24/2019   Dyslipidemia associated  with type 2 diabetes mellitus (HCC)    Fall at home, initial encounter 01/02/2023   GERD (gastroesophageal reflux disease)    History of cardioversion 12/2021   at [facility]   HTN (hypertension)    Hypothyroidism    Neuromuscular disorder (HCC)    neuropathy in feet   Neuropathy    notably improved following PCI with improved cardiac function   Obesity    Obesity, Class II, BMI 35.0-39.9, with comorbidity (see actual BMI)    BMI 39; wgt loss efforts in place; seeing Dietician   Open ankle fracture 01/02/2023   Orthostatic hypotension    PONV (postoperative nausea and vomiting)    "[employer]"   Pulmonary hypertension (HCC) 04/2012   PA pressure   Sleep apnea    uses nightly   Vitamin D deficiency    per facility notes   Past Surgical History:  Procedure Laterality Date   ABDOMINAL AORTAGRAM N/A 11/15/2011   Procedure: ABDOMINAL Ronny Flurry;  Surgeon: Sherren Kerns, MD;  Location: [facility] CATH LAB;  Service: Cardiovascular;  [name];   ABDOMINAL AORTOGRAM W/LOWER EXTREMITY N/A 10/13/2019   Procedure: ABDOMINAL AORTOGRAM W/LOWER EXTREMITY;  Surgeon: Iran Ouch, MD;  Location: [facility] INVASIVE CV LAB;  Service: CV: Non-obst Ao-Iliac. R SFA-PopA patent with significant calcific stenosis of TP trunk-prox PTA (dominant) & CTO ATA  -> R PTA-TP PTCA   ABDOMINAL AORTOGRAM W/LOWER EXTREMITY N/A 11/06/2022   Procedure: ABDOMINAL AORTOGRAM W/LOWER EXTREMITY;  Surgeon: Iran Ouch, MD;  Location: [facility] INVASIVE CV LAB;  Service: Cardiovascular;  [name];   ABDOMINAL AORTOGRAM W/LOWER EXTREMITY Right 04/01/2023   Procedure: ABDOMINAL AORTOGRAM W/LOWER EXTREMITY;  Surgeon: Nada Libman, MD;  Location: [facility] INVASIVE CV LAB;  Service: Cardiovascular;  Laterality: Right;   ABIs  04/27/2012   mild bilateral arterial insufficiency   APPLICATION OF WOUND VAC Right 03/31/2023   Procedure: APPLICATION OF WOUND VAC;  Surgeon: Roby Lofts, MD;  [name];  Service:  Orthopedics;  Laterality: Right;   BACK SURGERY  2005 x1   X2-2010   BUNIONECTOMY Right 11/11/2013   Procedure: RIGHT FOOT SILVER BUNIONECTOMY;  Surgeon: Toni Arthurs, MD;  Location: [facility];  Service: Orthopedics;  Laterality: Right;   CARDIAC CATHETERIZATION  12/11/2001   significant 3V CAD, normal LV function   CARDIOVERSION N/A 03/13/2022   Procedure: CARDIOVERSION;  Surgeon: Little Ishikawa, MD;  Location: [facility] ENDOSCOPY;  Service: Cardiovascular;  [name];   CARDIOVERSION N/A 07/25/2022   Procedure: CARDIOVERSION;  Surgeon: Jake Bathe, MD;  [facility] INVAS[facility] CV LAB;  Service: Cardiovascular;  [name];   Carotid Doppler  04/27/2012   right internal carotid: Elevated velocities but no evidence of plaque. Left internal carotid 40-59%   CAROTID ENDARTERECTOMY  2005   Right; recent carotid Dopplers notes elevated velocities.    colonscopy     CORONARY ANGIOPLASTY  12/21/2001   PTCA of  the distal and mid AV groove circ, unsuccessful PTCA of second OM total occlusion, unsuccessful PTCA of the apical LAD total occlusion   CORONARY ANGIOPLASTY WITH STENT PLACEMENT  04/29/2012   PCI to 3 RCA lesions, Promus Premiere 2.19mmx8mm distally, mid was 2.21mx28mm and proximally 2.75x62mm, EF 55-60%   CORONARY STENT PLACEMENT  04/28/2012   PCI to LAD (3x64mm Xience DES postdilated to 3.25) and circ prox and mid (2 overlappinmg 2.1mmx12mmXience DES postdilated to 2.33mm)   DOPPLER ECHOCARDIOGRAPHY  04/28/2012   poor quality study: EF estimated 60-65%; unable to assess diastolic function (previously noted to have diastolic dysfunction); severely dilated left atrium and mild right atrium; dilated IVC consistent with increased central venous pressure.Marland Kitchen   HARDWARE REMOVAL Right 03/31/2023   Procedure: HARDWARE REMOVAL RIGHT ANKLE;  Surgeon: Roby Lofts, MD;  [name];  Service: Orthopedics;  Laterality: Right;   HARDWARE REMOVAL Right 01/28/2023    Procedure: REMOVAL OF HARDWARE RIGHT ANKLE;  Surgeon: [name], MD;  [name];  Service: Orthopedics;  Laterality: Right;   HERNIA REPAIR     I & D EXTREMITY Right 01/02/2023   Procedure: IRRIGATION AND DEBRIDEMENT RIGHT ANKLE;  Surgeon: [name], MD;  [name];  Service: Orthopedics;  Laterality: Right;   I & D EXTREMITY Right 04/02/2023   Procedure: DEBRIDEMENT RIGHT ANKLE;  Surgeon: Nadara Mustard, MD;  Location: [facility] OR;  Service: Orthopedics;  Laterality: Right;   IR FLUORO GUIDE CV LINE LEFT  04/22/2023   IR FLUORO GUIDE CV LINE RIGHT  04/07/2023   IR PATIENT EVAL TECH 0-60 MINS  04/07/2023   IR RADIOLOGIST EVAL & MGMT  04/08/2023   IR REMOVAL TUN CV CATH W/O FL  05/05/2023   IR US GUIDE VASC ACCESS LEFT  04/22/2023   IR US GUIDE VASC ACCESS RIGHT  04/07/2023   LEFT AND RIGHT HEART CATHETERIZATION WITH CORONARY ANGIOGRAM N/A 04/27/2012   Procedure: LEFT AND RIGHT HEART CATHETERIZATION WITH CORONARY ANGIOGRAM;  Surgeon: Marykay Lex, MD;  Location: [facility] CATH LAB;  Service: Cardiovascular;  [name];   LEFT AND RIGHT HEART CATHETERIZATION WITH CORONARY ANGIOGRAM N/A 05/14/2013   Procedure: LEFT AND RIGHT HEART CATHETERIZATION WITH CORONARY ANGIOGRAM;  Surgeon: Lennette Bihari, MD;  [name]; PCWP ;; [name], [name], Patent RCA ostial mid and distal stents - 70% distal runoff Dz   LUMBAR LAMINECTOMY/DECOMPRESSION MICRODISCECTOMY  01/07/2012   Procedure: LUMBAR LAMINECTOMY/DECOMPRESSION MICRODISCECTOMY 1 LEVEL;  Surgeon: Carmela Hurt, MD;  [name];  Service: Neurosurgery;  Laterality: Bilateral;   Lumbar Three-Four Decompression   LUMBAR LAMINECTOMY/DECOMPRESSION MICRODISCECTOMY N/A 07/26/2020   Procedure: Lumbar two-three Laminectomy/Foraminotomy;  Surgeon: Coletta Memos, MD;  [name];  Service:  Neurosurgery;  [name];   ORIF ANKLE FRACTURE Right 01/02/2023   Procedure: OPEN REDUCTION INTERNAL FIXATION (ORIF)RIGHT ANKLE FRACTURE;  Surgeon: [name], MD;  [name];  Service: Orthopedics;  Laterality: Right;   PERCUTANEOUS CORONARY STENT INTERVENTION (PCI-S) N/A 04/28/2012   Procedure: PERCUTANEOUS CORONARY STENT INTERVENTION (PCI-S);  Surgeon: Lennette Bihari, MD;  Location: [facility] CATH LAB;  Service: Cardiovascular;  [name];   PERCUTANEOUS CORONARY STENT INTERVENTION (PCI-S) N/A 04/29/2012   Procedure: PERCUTANEOUS CORONARY STENT INTERVENTION (PCI-S);  Surgeon: Marykay Lex, MD;  Location: [facility] CATH LAB;  Service: Cardiovascular;  [name];  PERIPHERAL VASCULAR ATHERECTOMY  11/06/2022   Procedure: PERIPHERAL VASCULAR ATHERECTOMY;  Surgeon: Iran Ouch, MD;  [facility];  Service: Cardiovascular;;   PERIPHERAL VASCULAR BALLOON ANGIOPLASTY Right 10/13/2019   Procedure: PERIPHERAL VASCULAR BALLOON ANGIOPLASTY;  Surgeon: Iran Ouch, MD;  [facility];  Service: Cardiovascular;  Laterality: Right;  PTCA of R TP Trunk into PTA (Drug-coated) => Follow-up ABIs 0.9 on the right and 0.99 on the left.   PERIPHERAL VASCULAR BALLOON ANGIOPLASTY Right 04/01/2023   Procedure: PERIPHERAL VASCULAR BALLOON ANGIOPLASTY;  Surgeon: Nada Libman, MD;  [facility];  Service: Cardiovascular;  Laterality: Right;   PERIPHERAL VASCULAR INTERVENTION Right 04/01/2023   Procedure: PERIPHERAL VASCULAR INTERVENTION;  Surgeon: Nada Libman, MD;  [facility] CV LAB;  Service: Cardiovascular;  Laterality: Right;   SPINE SURGERY     UMBILICAL HERNIA REPAIR  2009   steel mesh insert    reports that he quit smoking about 20 years ago. His smoking use included cigarettes. He started smoking about 62 years ago. He has a 42 pack-year smoking history. He has never used smokeless tobacco. He reports that he does not  drink alcohol and does not use drugs.  Allergies  Allergen Reactions   Doxycycline Hives and Rash   Septra [Bactrim] Itching   Penicillins Rash    Allergy to All cillin drugs  Ancef given 9/24 with no obvious reaction   Metformin And Related Diarrhea and Nausea Only   Other Nausea And Vomiting    General Anesthesia - sometimes causes nausea and vomiting * OK to use scopolamine patch     Sulfamethoxazole-Trimethoprim Other (See Comments)   Wellbutrin [Bupropion]     Mood Changes    Family History  Adopted: Yes  Family history unknown: Yes    Prior to Admission medications   Medication Sig Start Date End Date Taking? Authorizing Provider  acetaminophen (TYLENOL) 500 MG tablet Take 1 tablet (500 mg total) by mouth every 6 (six) hours as needed for mild pain (pain score 1-3). 01/07/23  Yes Hongalgi, Maximino Greenland, MD  allopurinol (ZYLOPRIM) 100 MG tablet Take 0.5 tablets (50 mg total) by mouth daily. 05/10/23  Yes Hughie Closs, MD  amiodarone (PACERONE) 200 MG tablet Take 1 tablet (200 mg total) by mouth 2 (two) times daily. 05/10/23  Yes Hughie Closs, MD  apixaban (ELIQUIS) 5 MG TABS tablet TAKE 1 TABLET BY MOUTH TWICE A DAY 02/17/23  Yes Marykay Lex, MD  atorvastatin (LIPITOR) 40 MG tablet Take 1 tablet (40 mg total) by mouth daily. 05/10/23 06/09/23 Yes Pahwani, Daleen Bo, MD  clopidogrel (PLAVIX) 75 MG tablet Take 75 mg by mouth every morning. 02/04/23  Yes [provider]  colchicine 0.6 MG tablet Take 0.6 mg by mouth daily.   Yes [provider]  DULoxetine (CYMBALTA) 60 MG capsule Take 60 mg by mouth daily.   Yes [provider]  ezetimibe (ZETIA) 10 MG tablet Take 10 mg by mouth daily. 05/09/22  Yes [provider]  furosemide (LASIX) 80 MG tablet Take 0.5 tablets (40 mg total) by mouth daily. May take additional tablet for weight gain of 3 pounds in one day or five pounds in one week Patient taking differently: Take 160 mg by mouth 2 (two) times daily.  May take additional tablet for weight gain of 3 pounds in one day or five pounds in one week 01/14/23  Yes [place], Eric J, DO  gabapentin (NEURONTIN) 300  MG capsule Take 1 capsule (300 mg total) by mouth at bedtime. 05/10/23  Yes Pahwani, Daleen Bo, MD  HYDROcodone bit-homatropine (HYCODAN) 5-1.5 MG/5ML syrup Take 5 mLs by mouth every 6 (six) hours as needed for cough. 04/06/23  Yes [provider]  levothyroxine (SYNTHROID, LEVOTHROID) 88 MCG tablet Take 88 mcg by mouth daily before breakfast.   Yes [provider]  loratadine (CLARITIN) 10 MG tablet Take 1 tablet (10 mg total) by mouth daily. 01/15/23  Yes [place], Eric J, DO  losartan (COZAAR) 25 MG tablet Take 12.5-25 mg by mouth daily. 04/07/23  Yes [provider]  midodrine (PROAMATINE) 5 MG tablet Take 1 tablet (5 mg total) by mouth 3 (three) times daily with meals. 05/10/23 06/09/23 Yes Hughie Closs, MD  Multiple Vitamin (MULTIVITAMIN WITH MINERALS) TABS tablet Take 1 tablet by mouth daily. 01/08/23  Yes Hongalgi, Maximino Greenland, MD  pantoprazole (PROTONIX) 40 MG tablet TAKE 1 TABLET BY MOUTH EVERY DAY 12/13/22  Yes Dunn, Dayna N, PA-C  SODIUM FLUORIDE 5000 PPM 1.1 % PSTE Take 1 Application by mouth in the morning and at bedtime. 02/17/23  Yes [provider]  tamsulosin (FLOMAX) 0.4 MG CAPS capsule Take 1 capsule (0.4 mg total) by mouth daily. 05/10/23 06/09/23 Yes Pahwani, Daleen Bo, MD  tirzepatide [facility]) 15 MG/0.5ML Pen Inject 15 mg into the skin once a week. Patient taking differently: Inject 15 mg into the skin once a week. On Tuesdays 12/30/22  Yes   traZODone (DESYREL) 100 MG tablet Take 100 mg by mouth at bedtime. 03/04/23  Yes [provider]  TRESIBA FLEXTOUCH 200 UNIT/ML FlexTouch Pen Inject 28 Units into the skin 2 (two) times daily. 05/10/23 06/09/23 Yes Pahwani, Daleen Bo, MD  benzonatate (TESSALON) 200 MG capsule Take 1 capsule (200 mg total) by mouth 3 (three) times daily as needed for cough. Patient not  taking: Reported on 05/12/2023 04/10/23   Jerald Kief, MD  nitroGLYCERIN (NITROSTAT) 0.4 MG SL tablet PLACE 1 TAB UNDER THE TONGUE EVERY 5 MINUTES AS NEEDED FOR CHEST PAIN (SEVERE PRESSURE OR TIGHTNESS) Patient taking differently: Place 0.4 mg under the tongue every 5 (five) minutes as needed for chest pain. PLACE 1 TAB UNDER THE TONGUE EVERY 5 MINUTES AS NEEDED FOR CHEST PAIN (SEVERE PRESSURE OR TIGHTNESS) 02/22/19   Marykay Lex, MD  PRESCRIPTION MEDICATION Pt uses CPAP Machine at bedtime    [provider]                                                                                   Vitals:   05/12/23 1000 05/12/23 1005 05/12/23 1045 05/12/23 1145  BP: (!) 152/61  (!) 162/141 (!) 169/48  Pulse: 65 65 66 74  Resp:  17 17 16   Temp:      TempSrc:      SpO2: 100% 100% 100% 100%  Weight:      Height:       Physical Exam Vitals and nursing note reviewed.  Constitutional:      General: He is not in acute distress.    Appearance: He is obese. He is ill-appearing.  HENT:     Head: Normocephalic and atraumatic.  Right Ear: Hearing normal.     Left Ear: Hearing normal.     Nose: Nose normal. No nasal deformity.     Mouth/Throat:     Lips: Pink.     Tongue: No lesions.     Pharynx: Oropharynx is clear.  Eyes:     General: Lids are normal.     Extraocular Movements: Extraocular movements intact.  Cardiovascular:     Rate and Rhythm: Normal rate and regular rhythm.     Pulses:          Dorsalis pedis pulses are 2+ on the right side and 2+ on the left side.     Heart sounds: Normal heart sounds.  Pulmonary:     Effort: Pulmonary effort is normal.     Breath sounds: Normal breath sounds.  Abdominal:     General: Bowel sounds are normal. There is no distension.     Palpations: Abdomen is soft. There is no mass.     Tenderness: There is no abdominal tenderness.  Musculoskeletal:     Right lower leg: No edema.     Left lower leg: No edema.  Skin:    General:  Skin is warm.  Neurological:     General: No focal deficit present.     Mental Status: He is alert and oriented to person, place, and time.     Cranial Nerves: Cranial nerves 2-12 are intact.  Psychiatric:        Attention and Perception: Attention normal.        Mood and Affect: Mood normal.        Speech: Speech normal.        Behavior: Behavior normal. Behavior is cooperative.      Labs on Admission: I have personally reviewed following labs and imaging studies  CBC: Recent Labs  Lab 05/06/23 0918 05/12/23 0947  WBC 6.5 6.9  HGB 8.0* 9.0*  HCT 24.3* 27.5*  MCV 91.4 92.3  PLT 172 162   Basic Metabolic Panel: Recent Labs  Lab 05/06/23 0625 05/07/23 0407 05/08/23 0511 05/09/23 0414 05/10/23 0358 05/12/23 0947  NA 137 136 135 138 140 138  K 4.2 4.6 4.0 3.9 4.6 3.8  CL 99 99 101 105 102 101  CO2 26 30 26 27 25 25   GLUCOSE 169* 167* 193* 102* 99 229*  BUN 34* 22 28* 30* 30* 29*  CREATININE 4.98* 3.71* 4.08* 4.23* 4.07* 3.66*  CALCIUM 8.9 8.6* 8.2* 8.2* 8.8* 8.7*  PHOS 4.6 3.5 4.1 4.6 4.6  --    GFR: Estimated Creatinine Clearance: 27.6 mL/min (A) (by C-G formula based on SCr of 3.66 mg/dL (H)). Liver Function Tests: Recent Labs  Lab 05/06/23 0625 05/07/23 0407 05/08/23 0511 05/09/23 0414 05/10/23 0358  ALBUMIN 2.7* 2.6* 2.6* 2.6* 2.6*   No results for input(s): "LIPASE", "AMYLASE" in the last 168 hours. No results for input(s): "AMMONIA" in the last 168 hours. Coagulation Profile: Recent Labs  Lab 05/12/23 0947  INR 1.5*   Cardiac Enzymes: No results for input(s): "CKTOTAL", "CKMB", "CKMBINDEX", "TROPONINI" in the last 168 hours. BNP (last 3 results) No results for input(s): "PROBNP" in the last 8760 hours. HbA1C: No results for input(s): "HGBA1C" in the last 72 hours. CBG: Recent Labs  Lab 05/09/23 2359 05/10/23 0456 05/10/23 0754 05/10/23 0811 05/10/23 0837  GLUCAP 145* 71 70 70 100*   Lipid Profile: No results for input(s): "CHOL",  "HDL", "LDLCALC", "TRIG", "CHOLHDL", "LDLDIRECT" in the last 72 hours. Thyroid Function Tests: No  results for input(s): "TSH", "T4TOTAL", "FREET4", "T3FREE", "THYROIDAB" in the last 72 hours. Anemia Panel: No results for input(s): "VITAMINB12", "FOLATE", "FERRITIN", "TIBC", "IRON", "RETICCTPCT" in the last 72 hours. Urine analysis:    Component Value Date/Time   COLORURINE YELLOW 04/15/2023 1732   APPEARANCEUR CLOUDY (A) 04/15/2023 1732   LABSPEC 1.014 04/15/2023 1732   PHURINE 5.0 04/15/2023 1732   GLUCOSEU >=500 (A) 04/15/2023 1732   HGBUR MODERATE (A) 04/15/2023 1732   BILIRUBINUR NEGATIVE 04/15/2023 1732   BILIRUBINUR negative 05/12/2018 1213   BILIRUBINUR small 06/29/2014 0859   KETONESUR NEGATIVE 04/15/2023 1732   PROTEINUR >=300 (A) 04/15/2023 1732   UROBILINOGEN 0.2 05/12/2018 1213   UROBILINOGEN 1.0 05/02/2014 2015   NITRITE NEGATIVE 04/15/2023 1732   LEUKOCYTESUR NEGATIVE 04/15/2023 1732   Radiological Exams on Admission: DG Ankle Complete Right Result Date: 05/12/2023 CLINICAL DATA:  sp fall, open fracture; sp fall with open fracture. EXAM: RIGHT ANKLE - COMPLETE 3+ VIEW; RIGHT TIBIA AND FIBULA - 2 VIEW COMPARISON:  None Available. FINDINGS: There is comminuted and displaced fracture of the lower diaphysis of the fibula. There are ghost tracks in the fibula from previously removed hardware. There is faint ossific density surrounding the fracture site which may represent underlying heterotopic ossification. There are also age indeterminate fractures of the medial malleolus and posterior malleolus. No other acute fracture or dislocation. No aggressive osseous lesion. Ankle mortise appears intact. Mild-to-moderate diffuse soft tissue swelling overlying the medial malleolus. Vascular stent noted along the lower third femur. No radiopaque foreign bodies. IMPRESSION: *Comminuted and displaced fracture of the lower diaphysis of the fibula. *Age indeterminate fractures of the medial and  posterior malleoli. Electronically Signed   By: Jules Schick M.D.   On: 05/12/2023 12:01   DG Tibia/Fibula Right Result Date: 05/12/2023 CLINICAL DATA:  sp fall, open fracture; sp fall with open fracture. EXAM: RIGHT ANKLE - COMPLETE 3+ VIEW; RIGHT TIBIA AND FIBULA - 2 VIEW COMPARISON:  None Available. FINDINGS: There is comminuted and displaced fracture of the lower diaphysis of the fibula. There are ghost tracks in the fibula from previously removed hardware. There is faint ossific density surrounding the fracture site which may represent underlying heterotopic ossification. There are also age indeterminate fractures of the medial malleolus and posterior malleolus. No other acute fracture or dislocation. No aggressive osseous lesion. Ankle mortise appears intact. Mild-to-moderate diffuse soft tissue swelling overlying the medial malleolus. Vascular stent noted along the lower third femur. No radiopaque foreign bodies. IMPRESSION: *Comminuted and displaced fracture of the lower diaphysis of the fibula. *Age indeterminate fractures of the medial and posterior malleoli. Electronically Signed   By: Jules Schick M.D.   On: 05/12/2023 12:01   Data Reviewed: Relevant notes from primary care and specialist visits, past discharge summaries as available in EHR, including Care Everywhere. Prior diagnostic testing as pertinent to current admission diagnoses, Updated medications and problem lists for reconciliation ED course, including vitals, labs, imaging, treatment and response to treatment,Triage notes, nursing and pharmacy notes and ED provider's notes Notable results as noted in HPI.Discussed case with EDMD/ ED APP/ or Specialty MD on call and as needed.  Assessment & Plan Acute right ankle pain 2/2 to nontraumatic fibular fracture. Orthopedic has been consulted.  Surgery planned and npo after MN. PRN morphine x 3 doses. Chronic renal disease, stage IV (HCC) Lab Results  Component Value Date    CREATININE 3.66 (H) 05/12/2023   CREATININE 4.07 (H) 05/10/2023   CREATININE 4.23 (H) 05/09/2023  Kidney function  is improving, nephrology consult as deemed appropriate.  Will avoid contrast and renally dose medications as needed.  Essential hypertension Vitals:   05/12/23 0942 05/12/23 1000 05/12/23 1045 05/12/23 1145  BP: (!) 135/55 (!) 152/61 (!) 162/141 (!) 169/48  Home regimen has yet to be verified and until then we will start patient on as needed hydralazine.  OSA on CPAP CPAP home settings. Paroxysmal atrial fibrillation (HCC) Will verify last dose of Eliquis, patient is currently in sinus rhythm we will continue his amiodarone. Acquired hypothyroidism Continue patient on his levothyroxine at 88 mcg. Uncontrolled type 2 diabetes mellitus with hyperglycemia, with long-term current use of insulin (HCC) Glycemic protocol.  Carb consistent diet.  N.p.o. after midnight with Accu-Cheks every 4 hourly until postop. CAD, LAD/CFX DES 04/28/12- staged RCA DES 04/29/12 after pt declined CABG, cath 05/14/13 stable CAD medical therapy Patient has extensive cardiac history and at bedside denies, does not report any dizziness or blackout or lightheadedness, or chest pain pressure tightness shortness of breath palpitations, also denies any anginal equivalent symptoms.  Patient has however fallen 4 times in the past 48 hours due to weakness in his legs, And unable to balance. EKG today is mildly different with a QRS of 121, no ST-T wave changes.  Will discuss with Ortho and consider cardiac clearance.  Patient had echo in February 2025 results as above. Continuous cardiac monitoring, antiplatelet and anticoagulation held for tentative surgery in AM, if plan of surgery timing is changed we will decide with pharmacy for heparin GTT.  As patient has a history of PAF. Per ortho pt may cont plavix and eliquis. Anxiety and depression Continue patient on his Cymbalta.  Although will defer to outpatient  provider for different options as Cymbalta can precipitate and worsen blood pressure fluctuation and orthostatic changes especially as patient is currently on midodrine. Gastroesophageal reflux disease without esophagitis IV PPI therapy and aspiration precaution.     DVT prophylaxis:  Eliquis Consults:  Orthopedic. Advance Care Planning:    Code Status: Full Code   Family Communication:  Famiyl at bedside Disposition Plan:  TBD  Severity of Illness: The appropriate patient status for this patient is INPATIENT. Inpatient status is judged to be reasonable and necessary in order to provide the required intensity of service to ensure the patient's safety. The patient's presenting symptoms, physical exam findings, and initial radiographic and laboratory data in the context of their chronic comorbidities is felt to place them at high risk for further clinical deterioration. Furthermore, it is not anticipated that the patient will be medically stable for discharge from the hospital within 2 midnights of admission.   * I certify that at the point of admission it is my clinical judgment that the patient will require inpatient hospital care spanning beyond 2 midnights from the point of admission due to high intensity of service, high risk for further deterioration and high frequency of surveillance required.*  Author: Gertha Calkin, MD 05/12/2023 2:13 PM  For on call review [email or web address].   Unresulted Labs (From admission, onward)     Start     Ordered   05/13/23 0500  Comprehensive metabolic panel  Tomorrow morning,   R        05/12/23 1343   05/13/23 0500  CBC  Tomorrow morning,   R        05/12/23 1343   05/13/23 0500  Phosphorus  Tomorrow morning,   R        05/12/23 1343   05/12/23  1340  Magnesium  Add-on,   AD        05/12/23 1343            Orders Placed This Encounter  Procedures   DG Ankle Complete Right   DG Tibia/Fibula Right   CBC   Basic metabolic panel    Protime-INR   Comprehensive metabolic panel   CBC   Magnesium   Phosphorus   Diet NPO time specified   Apply splint short leg   Maintain IV access   Vital signs   Notify physician (specify)   Mobility Protocol: No Restrictions RN to initiate protocols based on patient's level of care   Refer to Sidebar Report Refer to ICU, Med-Surg, Progressive, and Step-Down Mobility Protocol Sidebars   Initiate Adult Central Line Maintenance and Catheter Protocol for patients with central line (CVC, PICC, Port, Hemodialysis, Trialysis)   Daily weights   Intake and Output   Do not place and if present remove PureWick   Initiate Oral Care Protocol   Initiate Carrier Fluid Protocol   RN may order General Admission PRN Orders utilizing "General Admission PRN medications" (through manage orders) for the following patient needs: allergy symptoms (Claritin), cold sores (Carmex), cough (Robitussin DM), eye irritation (Liquifilm Tears), hemorrhoids (Tucks), indigestion (Maalox), minor skin irritation (Hydrocortisone Cream), muscle pain Romeo Apple Gay), nose irritation (saline nasal spray) and sore throat (Chloraseptic spray).   Cardiac Monitoring - Continuous Indefinite   Full code   Consult to hospitalist   Consult to nephrology   OT eval and treat   PT eval and treat   Pulse oximetry check with vital signs   Oxygen therapy Mode or (Route): Nasal cannula; Liters Per Minute: 2; Keep O2 saturation between: greater than 92 %   ED EKG   EKG 12-Lead   Admit to Inpatient (patient's expected length of stay will be greater than 2 midnights or inpatient only procedure)   Aspiration precautions   Fall precautions

## 2023-05-12 NOTE — Assessment & Plan Note (Addendum)
 Glycemic protocol.  Carb consistent diet.  N.p.o. after midnight with Accu-Cheks every 4 hourly until postop.

## 2023-05-12 NOTE — Progress Notes (Signed)
 Late Note Entry- May 12, 2023  D/C noted. Chart reviewed. Noted pt doe not require out-pt HD at this time due to recovery per nephrologist note. Contacted [facility] admissions and [facility] to be advised of pt's renal recovery and that pt's referral can be cancelled. Spoke to pt's spouse to make sure he was aware that pt will not need to go to clinic at d/c. Spouse confirms knowledge of this info.   Olivia Canter Renal Navigator [phone]

## 2023-05-12 NOTE — Progress Notes (Signed)
 PHARMACY NOTE:  ANTIMICROBIAL RENAL DOSAGE ADJUSTMENT  Current antimicrobial regimen includes a mismatch between antimicrobial dosage and estimated renal function.  As per policy approved by the Pharmacy & Therapeutics and Medical Executive Committees, the antimicrobial dosage will be adjusted accordingly.  Current antimicrobial dosage:  Ancef 2gm IV q8h x 3  Indication: post-op prophylaxis  Renal Function:  Estimated Creatinine Clearance: 27.6 mL/min (A) (by C-G formula based on SCr of 3.66 mg/dL (H)).    Antimicrobial dosage has been changed to:  Ancef 2gm IV q12h x 2  Thank you for allowing pharmacy to be a part of this patient's care.  Christoper Fabian, PharmD, BCPS Please see amion for complete clinical pharmacist phone list 05/12/2023 9:16 PM

## 2023-05-12 NOTE — Transfer of Care (Signed)
 Immediate Anesthesia Transfer of Care Note  Patient: [name]  Procedure(s) Performed: EXTERNAL FIXATION, LOWER EXTREMITY (Right)  Patient Location: PACU  Anesthesia Type:General  Level of Consciousness: awake, alert , and oriented  Airway & Oxygen Therapy: Patient Spontanous Breathing and Patient connected to face mask oxygen  Post-op Assessment: Report given to RN and Post -op Vital signs reviewed and stable  Post vital signs: Reviewed and stable  Last Vitals:  Vitals Value Taken Time  BP 156/67 05/12/23 1900  Temp 37.1 C 05/12/23 1900  Pulse 79 05/12/23 1907  Resp 23 05/12/23 1907  SpO2 100 % 05/12/23 1907  Vitals shown include unfiled device data.  Last Pain:  Vitals:   05/12/23 1900  TempSrc:   PainSc: 6          Complications: No notable events documented.

## 2023-05-12 NOTE — Assessment & Plan Note (Addendum)
 Continue patient on his levothyroxine at 88 mcg.

## 2023-05-12 NOTE — Assessment & Plan Note (Addendum)
 Patient has extensive cardiac history and at bedside denies, does not report any dizziness or blackout or lightheadedness, or chest pain pressure tightness shortness of breath palpitations, also denies any anginal equivalent symptoms.  Patient has however fallen 4 times in the past 48 hours due to weakness in his legs, And unable to balance. EKG today is mildly different with a QRS of 121, no ST-T wave changes.  Will discuss with Ortho and consider cardiac clearance.  Patient had echo in February 2025 results as above. Continuous cardiac monitoring, antiplatelet and anticoagulation held for tentative surgery in AM, if plan of surgery timing is changed we will decide with pharmacy for heparin GTT.  As patient has a history of PAF. Per ortho pt may cont plavix and eliquis.

## 2023-05-12 NOTE — Anesthesia Procedure Notes (Signed)
 Procedure Name: LMA Insertion Date/Time: 05/12/2023 6:05 PM  Performed by: Debbe Odea, CRNAPre-anesthesia Checklist: Patient identified, Emergency Drugs available, Suction available and Patient being monitored Patient Re-evaluated:Patient Re-evaluated prior to induction Oxygen Delivery Method: Circle System Utilized Preoxygenation: Pre-oxygenation with 100% oxygen Induction Type: IV induction Ventilation: Mask ventilation without difficulty LMA: LMA inserted LMA Size: 4.0 Number of attempts: 1 Airway Equipment and Method: Bite block Placement Confirmation: positive ETCO2 Tube secured with: Tape Dental Injury: Teeth and Oropharynx as per pre-operative assessment

## 2023-05-12 NOTE — Assessment & Plan Note (Addendum)
 Lab Results  Component Value Date   CREATININE 3.66 (H) 05/12/2023   CREATININE 4.07 (H) 05/10/2023   CREATININE 4.23 (H) 05/09/2023  Kidney function is improving, nephrology consult as deemed appropriate.  Will avoid contrast and renally dose medications as needed.

## 2023-05-12 NOTE — Progress Notes (Signed)
 Orthopedic Tech Progress Note Patient Details:  Harold [name] 10-08-1955 [id number]  Ortho Devices Type of Ortho Device: Ace wrap, Cotton web roll, Stirrup splint, Short leg splint Ortho Device/Splint Location: RLE Ortho Device/Splint Interventions: Ordered, Application, Adjustmentwas given a verbal order to apply splint to patient as is..with family at bedside    Post Interventions Patient Tolerated: Well Instructions Provided: Care of device  [name] 05/12/2023, 1:00 PM

## 2023-05-12 NOTE — Anesthesia Postprocedure Evaluation (Signed)
 Anesthesia Post Note  Patient: Harold [name]  Procedure(s) Performed: EXTERNAL FIXATION, LOWER EXTREMITY (Right)     Patient location during evaluation: PACU Anesthesia Type: General Level of consciousness: awake Pain management: pain level controlled Vital Signs Assessment: post-procedure vital signs reviewed and stable Respiratory status: spontaneous breathing, nonlabored ventilation and respiratory function stable Cardiovascular status: blood pressure returned to baseline and stable Postop Assessment: no apparent nausea or vomiting Anesthetic complications: no   No notable events documented.  Last Vitals:  Vitals:   05/12/23 2000 05/12/23 2152  BP: (!) 148/88 (!) 132/54  Pulse: 77 75  Resp: 18 16  Temp: 36.6 C 36.5 C  SpO2: 100% 100%    Last Pain:  Vitals:   05/12/23 2152  TempSrc: Oral  PainSc:                  Catheryn Bacon Habeeb Puertas

## 2023-05-12 NOTE — Assessment & Plan Note (Addendum)
 Will verify last dose of Eliquis, patient is currently in sinus rhythm we will continue his amiodarone.

## 2023-05-12 NOTE — Assessment & Plan Note (Addendum)
 CPAP home settings.

## 2023-05-12 NOTE — Anesthesia Preprocedure Evaluation (Addendum)
 Anesthesia Evaluation  Patient identified by MRN, date of birth, ID band Patient awake    Reviewed: Allergy & Precautions, H&P , NPO status , Patient's Chart, lab work & pertinent test results  History of Anesthesia Complications (+) PONV and history of anesthetic complications  Airway Mallampati: III  TM Distance: >3 FB Neck ROM: Full    Dental no notable dental hx. (+) Teeth Intact, Dental Advisory Given   Pulmonary sleep apnea , COPD, former smoker   Pulmonary exam normal breath sounds clear to auscultation       Cardiovascular hypertension, Pt. on medications + CAD, + Past MI, + Cardiac Stents and + Peripheral Vascular Disease  + dysrhythmias Atrial Fibrillation  Rhythm:Regular Rate:Normal     Neuro/Psych   Anxiety Depression    negative neurological ROS     GI/Hepatic Neg liver ROS,GERD  Medicated,,  Endo/Other  diabetes, Type 2, Oral Hypoglycemic AgentsHypothyroidism  Class 3 obesity  Renal/GU Renal InsufficiencyRenal disease  negative genitourinary   Musculoskeletal   Abdominal   Peds  Hematology  (+) Blood dyscrasia, anemia   Anesthesia Other Findings   Reproductive/Obstetrics negative OB ROS                             Anesthesia Physical Anesthesia Plan  ASA: 4  Anesthesia Plan: General   Post-op Pain Management: Ofirmev IV (intra-op)*   Induction: Intravenous  PONV Risk Score and Plan: 4 or greater and Ondansetron, Dexamethasone and Scopolamine patch - Pre-op  Airway Management Planned: Oral ETT  Additional Equipment:   Intra-op Plan:   Post-operative Plan: Extubation in OR  Informed Consent: I have reviewed the patients History and Physical, chart, labs and discussed the procedure including the risks, benefits and alternatives for the proposed anesthesia with the patient or authorized representative who has indicated his/her understanding and acceptance.      Dental advisory given  Plan Discussed with: CRNA  Anesthesia Plan Comments:        Anesthesia Quick Evaluation

## 2023-05-12 NOTE — Interval H&P Note (Signed)
 History and Physical Interval Note:  05/12/2023 5:44 PM  Harold [name]  has presented today for surgery, with the diagnosis of OPEN ANKLE FX.  The various methods of treatment have been discussed with the patient and family. After consideration of risks, benefits and other options for treatment, the patient has consented to  Procedure(s): EXTERNAL FIXATION, LOWER EXTREMITY (Right) as a surgical intervention.  The patient's history has been reviewed, patient examined, no change in status, stable for surgery.  I have reviewed the patient's chart and labs.  Questions were answered to the patient's satisfaction.     Caryn Bee P Deshunda Thackston

## 2023-05-12 NOTE — H&P (View-Only) (Signed)
 Reason for Consult:Open right ankle fx Referring Physician: Estelle June Time called: 1121 Time at bedside: 1126   Harold [name] is an 68 y.o. male.  HPI: [name] got up this morning and his left leg buckled causing him to fall. He had immediate right ankle pain and a large wound. He was brought to the ED where x-rays showed an open ankle fx and orthopedic surgery was consulted.   Past Medical History:  Diagnosis Date   Anemia    Anginal pain (HCC) 04/2013   Cardiac cath showed patent stents with distal LAD, circumflex-OM and RCA disease in small vessels.   Anxiety    Atrial fibrillation (HCC) 12/2021   CAD S/P percutaneous coronary angioplasty 2003, 04/2012   status post PCI to LAD, circumflex-OM 2, RCA   Carotid artery occlusion    Chronic renal insufficiency, stage II (mild)    Chronic venous insufficiency    varicosities, no reflux; dopplers 04/14/12- valvular insufficiency in the R and L GSV   Complication of anesthesia    COPD, mild (HCC)    Depression    situaltional    Diabetes mellitus    Diabetic neuropathy (HCC) 08/24/2019   Dyslipidemia associated with type 2 diabetes mellitus (HCC)    Fall at home, initial encounter 01/02/2023   GERD (gastroesophageal reflux disease)    History of cardioversion 12/2021   at [facility]   HTN (hypertension)    Hypothyroidism    Neuromuscular disorder (HCC)    neuropathy in feet   Neuropathy    notably improved following PCI with improved cardiac function   Obesity    Obesity, Class II, BMI 35.0-39.9, with comorbidity (see actual BMI)    BMI 39; wgt loss efforts in place; seeing Dietician   Open ankle fracture 01/02/2023   Orthostatic hypotension    PONV (postoperative nausea and vomiting)    "[employer] Works"   Pulmonary hypertension (HCC) 04/2012   PA pressure   Sleep apnea    uses nightly   Vitamin D deficiency    per facility notes    Past Surgical History:  Procedure Laterality Date   ABDOMINAL AORTAGRAM N/A  11/15/2011   Procedure: ABDOMINAL Ronny Flurry;  Surgeon: Sherren Kerns, MD;  Location: [facility] CATH LAB;  Service: Cardiovascular;  [name];   ABDOMINAL AORTOGRAM W/LOWER EXTREMITY N/A 10/13/2019   Procedure: ABDOMINAL AORTOGRAM W/LOWER EXTREMITY;  Surgeon: Iran Ouch, MD;  [facility]: [facility] INVASIVE CV LAB;  Service: CV: Non-obst Ao-Iliac. R SFA-PopA patent with significant calcific stenosis of TP trunk-prox PTA (dominant) & CTO ATA  -> R PTA-TP PTCA   ABDOMINAL AORTOGRAM W/LOWER EXTREMITY N/A 11/06/2022   Procedure: ABDOMINAL AORTOGRAM W/LOWER EXTREMITY;  Surgeon: Iran Ouch, MD;  [facility]: [facility] INVAS[facility] CV LAB;  Service: Cardiovascular;  [name];   ABDOMINAL AORTOGRAM W/LOWER EXTREMITY Right 04/01/2023   Procedure: ABDOMINAL AORTOGRAM W/LOWER EXTREMITY;  Surgeon: Nada Libman, MD;  [facility]: [facility] INVAS[facility] CV LAB;  Service: Cardiovascular;  Laterality: Right;   ABIs  04/27/2012   mild bilateral arterial insufficiency   APPLICATION OF WOUND VAC Right 03/31/2023   Procedure: APPLICATION OF WOUND VAC;  Surgeon: Roby Lofts, MD;  [name];  Service: Orthopedics;  Laterality: Right;   BACK SURGERY  2005 x1   X2-2010   BUNIONECTOMY Right 11/11/2013   Procedure: RIGHT FOOT SILVER BUNIONECTOMY;  Surgeon: Toni Arthurs, MD;  Location: [facility];  Service: Orthopedics;  Laterality: Right;   CARDIAC CATHETERIZATION  12/11/2001  significant 3V CAD, normal LV function   CARDIOVERSION N/A 03/13/2022   Procedure: CARDIOVERSION;  Surgeon: Little Ishikawa, MD;  Location: [facility] ENDOSCOPY;  Service: Cardiovascular;  [name];   CARDIOVERSION N/A 07/25/2022   Procedure: CARDIOVERSION;  Surgeon: Jake Bathe, MD;  [facility] LAB;  Service: Cardiovascular;  [name];   Carotid Doppler  04/27/2012   right internal carotid: Elevated velocities but no evidence of plaque. Left internal carotid 40-59%   CAROTID ENDARTERECTOMY  2005   Right;  recent carotid Dopplers notes elevated velocities.    colonscopy     CORONARY ANGIOPLASTY  12/21/2001   PTCA of the distal and mid AV groove circ, unsuccessful PTCA of second OM total occlusion, unsuccessful PTCA of the apical LAD total occlusion   CORONARY ANGIOPLASTY WITH STENT PLACEMENT  04/29/2012   PCI to 3 RCA lesions, Promus Premiere 2.7mmx8mm distally, mid was 2.58mx28mm and proximally 2.75x36mm, EF 55-60%   CORONARY STENT PLACEMENT  04/28/2012   PCI to LAD (3x41mm Xience DES postdilated to 3.25) and circ prox and mid (2 overlappinmg 2.76mmx12mmXience DES postdilated to 2.69mm)   DOPPLER ECHOCARDIOGRAPHY  04/28/2012   poor quality study: EF estimated 60-65%; unable to assess diastolic function (previously noted to have diastolic dysfunction); severely dilated left atrium and mild right atrium; dilated IVC consistent with increased central venous pressure.Marland Kitchen   HARDWARE REMOVAL Right 03/31/2023   Procedure: HARDWARE REMOVAL RIGHT ANKLE;  Surgeon: Roby Lofts, MD;  [name];  Service: Orthopedics;  Laterality: Right;   HARDWARE REMOVAL Right 01/28/2023   Procedure: REMOVAL OF HARDWARE RIGHT ANKLE;  Surgeon: [name], MD;  [name];  Service: Orthopedics;  Laterality: Right;   HERNIA REPAIR     I & D EXTREMITY Right 01/02/2023   Procedure: IRRIGATION AND DEBRIDEMENT RIGHT ANKLE;  Surgeon: [name], MD;  [name];  Service: Orthopedics;  Laterality: Right;   I & D EXTREMITY Right 04/02/2023   Procedure: DEBRIDEMENT RIGHT ANKLE;  Surgeon: Nadara Mustard, MD;  Location: [facility] OR;  Service: Orthopedics;  Laterality: Right;   IR FLUORO GUIDE CV LINE LEFT  04/22/2023   IR FLUORO GUIDE CV LINE RIGHT  04/07/2023   IR PATIENT EVAL TECH 0-60 MINS  04/07/2023   IR RADIOLOGIST EVAL & MGMT  04/08/2023   IR REMOVAL TUN CV CATH W/O FL  05/05/2023   IR US GUIDE VASC ACCESS LEFT  04/22/2023   IR US GUIDE VASC ACCESS RIGHT  04/07/2023   LEFT AND RIGHT HEART CATHETERIZATION WITH  CORONARY ANGIOGRAM N/A 04/27/2012   Procedure: LEFT AND RIGHT HEART CATHETERIZATION WITH CORONARY ANGIOGRAM;  Surgeon: Marykay Lex, MD;  Location: [facility] CATH LAB;  Service: Cardiovascular;  [name];   LEFT AND RIGHT HEART CATHETERIZATION WITH CORONARY ANGIOGRAM N/A 05/14/2013   Procedure: LEFT AND RIGHT HEART CATHETERIZATION WITH CORONARY ANGIOGRAM;  Surgeon: Lennette Bihari, MD;  [name]; PCWP ;; [name], [name], Patent RCA ostial mid and distal stents - 70% distal runoff Dz   LUMBAR LAMINECTOMY/DECOMPRESSION MICRODISCECTOMY  01/07/2012   Procedure: LUMBAR LAMINECTOMY/DECOMPRESSION MICRODISCECTOMY 1 LEVEL;  Surgeon: Carmela Hurt, MD;  [name];  Service: Neurosurgery;  Laterality: Bilateral;   Lumbar Three-Four Decompression   LUMBAR LAMINECTOMY/DECOMPRESSION MICRODISCECTOMY N/A 07/26/2020   Procedure: Lumbar two-three Laminectomy/Foraminotomy;  Surgeon: Coletta Memos, MD;  [name];  Service: Neurosurgery;  [name];   ORIF ANKLE FRACTURE Right 01/02/2023   Procedure: OPEN REDUCTION INTERNAL FIXATION (ORIF)RIGHT ANKLE FRACTURE;  Surgeon: [name], MD;  [name];  Service: Orthopedics;  Laterality: Right;   PERCUTANEOUS CORONARY STENT INTERVENTION (PCI-S) N/A 04/28/2012   Procedure: PERCUTANEOUS CORONARY STENT INTERVENTION (PCI-S);  Surgeon: Lennette Bihari, MD;  Location: [facility] CATH LAB;  Service: Cardiovascular;  [name];   PERCUTANEOUS CORONARY STENT INTERVENTION (PCI-S) N/A 04/29/2012   Procedure: PERCUTANEOUS CORONARY STENT INTERVENTION (PCI-S);  Surgeon: Marykay Lex, MD;  Location: [facility] CATH LAB;  Service: Cardiovascular;  [name];   PERIPHERAL VASCULAR ATHERECTOMY  11/06/2022   Procedure: PERIPHERAL VASCULAR ATHERECTOMY;  Surgeon: Iran Ouch, MD;  [facility];  Service: Cardiovascular;;   PERIPHERAL VASCULAR BALLOON ANGIOPLASTY Right 10/13/2019   Procedure: PERIPHERAL VASCULAR BALLOON ANGIOPLASTY;  Surgeon: Iran Ouch, MD;  [facility];  Service: Cardiovascular;  Laterality: Right;  PTCA of R TP Trunk into PTA (Drug-coated) => Follow-up ABIs 0.9 on the right and 0.99 on the left.   PERIPHERAL VASCULAR BALLOON ANGIOPLASTY Right 04/01/2023   Procedure: PERIPHERAL VASCULAR BALLOON ANGIOPLASTY;  Surgeon: Nada Libman, MD;  [facility] CV LAB;  Service: Cardiovascular;  Laterality: Right;   PERIPHERAL VASCULAR INTERVENTION Right 04/01/2023   Procedure: PERIPHERAL VASCULAR INTERVENTION;  Surgeon: Nada Libman, MD;  [facility] CV LAB;  Service: Cardiovascular;  Laterality: Right;   SPINE SURGERY     UMBILICAL HERNIA REPAIR  2009   steel mesh insert    Family History  Adopted: Yes  Family history unknown: Yes    Social History:  reports that he quit smoking about 20 years ago. His smoking use included cigarettes. He started smoking about 62 years ago. He has a 42 pack-year smoking history. He has never used smokeless tobacco. He reports that he does not drink alcohol and does not use drugs.  Allergies:  Allergies  Allergen Reactions   Doxycycline Hives and Rash   Septra [Bactrim] Itching   Penicillins Rash    Allergy to All cillin drugs  Ancef given 9/24 with no obvious reaction   Metformin And Related Diarrhea and Nausea Only   Other Nausea And Vomiting    General Anesthesia - sometimes causes nausea and vomiting * OK to use scopolamine patch     Sulfamethoxazole-Trimethoprim Other (See Comments)   Wellbutrin [Bupropion]     Mood Changes    Medications: I have reviewed the patient's current medications.  Results for orders placed or performed during the hospital encounter of 05/12/23 (from the past 48 hours)  CBC     Status: Abnormal   Collection Time: 05/12/23  9:47 AM  Result  Value Ref Range   WBC 6.9 4.0 - 10.5 K/uL   RBC 2.98 (L) 4.22 - 5.81 MIL/uL   Hemoglobin 9.0 (L) 13.0 - 17.0 g/dL   HCT 40.9 (L) 81.1 - 91.4 %   MCV 92.3 80.0 - 100.0 fL   MCH 30.2 26.0 - 34.0 pg   MCHC 32.7 30.0 - 36.0 g/dL   RDW 78.2 (H) 95.6 - 21.3 %   Platelets 162 150 - 400 K/uL   nRBC 0.0 0.0 - 0.2 %    Comment: Performed at [facility], [address] [place], [place] [address]  Basic metabolic panel     Status: Abnormal   Collection Time: 05/12/23  9:47 AM  Result  Value Ref Range   Sodium 138 135 - 145 mmol/L   Potassium 3.8 3.5 - 5.1 mmol/L   Chloride 101 98 - 111 mmol/L   CO2 25 22 - 32 mmol/L   Glucose, Bld 229 (H) 70 - 99 mg/dL    Comment: Glucose reference range applies only to samples taken after fasting for at least 8 hours.   BUN 29 (H) 8 - 23 mg/dL   Creatinine, Ser 3.24 (H) 0.61 - 1.24 mg/dL   Calcium 8.7 (L) 8.9 - 10.3 mg/dL   GFR, Estimated 17 (L) >60 mL/min    Comment: (NOTE) Calculated using the CKD-EPI Creatinine Equation (2021)    Anion gap 12 5 - 15    Comment: Performed at [facility], [address], [place], [place] [address]  Protime-INR     Status: Abnormal   Collection Time: 05/12/23  9:47 AM  Result Value Ref Range   Prothrombin Time 18.5 (H) 11.4 - 15.2 seconds   INR 1.5 (H) 0.8 - 1.2    Comment: (NOTE) INR goal varies based on device and disease states. Performed at [facility], [address] [place], [place] [address]    *Note: Due to a large number of results and/or encounters for the requested time period, some results have not been displayed. A complete set of results can be found in Results Review.    DG Ankle Complete Right Result Date: 05/12/2023 CLINICAL DATA:  sp fall, open fracture; sp fall with open fracture. EXAM: RIGHT ANKLE - COMPLETE 3+ VIEW; RIGHT TIBIA AND FIBULA - 2 VIEW COMPARISON:  None Available. FINDINGS: There is comminuted and displaced fracture of the lower diaphysis of the fibula. There  are ghost tracks in the fibula from previously removed hardware. There is faint ossific density surrounding the fracture site which may represent underlying heterotopic ossification. There are also age indeterminate fractures of the medial malleolus and posterior malleolus. No other acute fracture or dislocation. No aggressive osseous lesion. Ankle mortise appears intact. Mild-to-moderate diffuse soft tissue swelling overlying the medial malleolus. Vascular stent noted along the lower third femur. No radiopaque foreign bodies. IMPRESSION: *Comminuted and displaced fracture of the lower diaphysis of the fibula. *Age indeterminate fractures of the medial and posterior malleoli. Electronically Signed   By: Jules Schick M.D.   On: 05/12/2023 12:01   DG Tibia/Fibula Right Result Date: 05/12/2023 CLINICAL DATA:  sp fall, open fracture; sp fall with open fracture. EXAM: RIGHT ANKLE - COMPLETE 3+ VIEW; RIGHT TIBIA AND FIBULA - 2 VIEW COMPARISON:  None Available. FINDINGS: There is comminuted and displaced fracture of the lower diaphysis of the fibula. There are ghost tracks in the fibula from previously removed hardware. There is faint ossific density surrounding the fracture site which may represent underlying heterotopic ossification. There are also age indeterminate fractures of the medial malleolus and posterior malleolus. No other acute fracture or dislocation. No aggressive osseous lesion. Ankle mortise appears intact. Mild-to-moderate diffuse soft tissue swelling overlying the medial malleolus. Vascular stent noted along the lower third femur. No radiopaque foreign bodies. IMPRESSION: *Comminuted and displaced fracture of the lower diaphysis of the fibula. *Age indeterminate fractures of the medial and posterior malleoli. Electronically Signed   By: Jules Schick M.D.   On: 05/12/2023 12:01    Review of Systems  HENT:  Negative for ear discharge, ear pain, hearing loss and tinnitus.   Eyes:  Negative for  photophobia and pain.  Respiratory:  Negative for cough and  shortness of breath.   Cardiovascular:  Negative for chest pain.  Gastrointestinal:  Negative for abdominal pain, nausea and vomiting.  Genitourinary:  Negative for dysuria, flank pain, frequency and urgency.  Musculoskeletal:  Positive for arthralgias (Right ankle). Negative for back pain, myalgias and neck pain.  Neurological:  Negative for dizziness and headaches.  Hematological:  Does not bruise/bleed easily.  Psychiatric/Behavioral:  The patient is not nervous/anxious.    Blood pressure (!) 169/48, pulse 74, temperature 98.1 F (36.7 C), temperature source Oral, resp. rate 16, height 6\' 1"  (1.854 m), weight 136.1 kg, SpO2 100%. Physical Exam Constitutional:      General: He is not in acute distress.    Appearance: He is well-developed. He is not diaphoretic.  HENT:     Head: Normocephalic and atraumatic.  Eyes:     General: No scleral icterus.       Right eye: No discharge.        Left eye: No discharge.     Conjunctiva/sclera: Conjunctivae normal.  Cardiovascular:     Rate and Rhythm: Normal rate and regular rhythm.  Pulmonary:     Effort: Pulmonary effort is normal. No respiratory distress.  Musculoskeletal:     Cervical back: Normal range of motion.     Comments: RLE Transverse lac medial ankle, no ecchymosis or rash  Mod TTP  No knee effusion  Knee stable to varus/ valgus and anterior/posterior stress  Sens DPN, SPN, TN intact  Motor EHL, ext, flex, evers 5/5  Toes perfused, No significant edema  Skin:    General: Skin is warm and dry.  Neurological:     Mental Status: He is alert.  Psychiatric:        Mood and Affect: Mood normal.        Behavior: Behavior normal.     Assessment/Plan: Open right ankle fx -- Plan I&D, ex fix vs ORIF either later today with Dr. Jena Gauss or tomorrow with Dr. Carola Frost. Please keep NPO for now.    Freeman Caldron, PA-C Orthopedic Surgery [phone] 05/12/2023, 1:37  PM

## 2023-05-12 NOTE — Op Note (Signed)
 Orthopaedic Surgery Operative Note (CSN: [id number] ) Date of Surgery: 05/12/2023  Admit Date: 05/12/2023   Diagnoses: Pre-Op Diagnoses: Right open trimalleolar ankle fracture  Post-Op Diagnosis: Same  Procedures: CPT 20690-External fixation of right ankle CPT 11012-Irrigation and debridement of right open trimalleolar ankle fracture CPT 27818-Closed reduction of right trimalleolar ankle fracture  Surgeons : Primary: Roby Lofts, MD  Assistant: Thyra Breed, PA-C  Location: OR 3   Anesthesia: General   Antibiotics: Ancef 2g preop with 1 gm vancomycin powder placed topically   Tourniquet time: None    Estimated Blood Loss: 100 mL  Complications: None   Specimens:* No specimens in log *   Implants: [employer] Xtra-fix large external fixation  Indications for Surgery: 68 year old male who had a trimalleolar ankle fracture that underwent open reduction internal fixation with Dr. Carola Frost.  He unfortunately developed a postoperative infection that required multiple debridements with long-term antibiotics.  He had his hardware removed.  He was discharged home yesterday and unfortunately he fell today and sustained a open ankle fracture dislocation through his previous fractures.  I felt that he was indicated for repeat irrigation debridement with external fixation.  This and benefits were discussed with the patient and his husband.  Risks include but not limited to bleeding, infection, malunion, nonunion, hardware failure, hardware rotation, nerve or blood vessel injury, posttraumatic arthritis, need for further surgeries including fusion surgeries, given the possible anesthetic complications.  They agreed to proceed with surgery and consent was obtained.  Operative Findings: 1.  Irrigation debridement of right trimalleolar ankle fracture dislocation using 3 L of normal saline 2.  Closed reduction and external fixation of right ankle fracture using [employer] large extra  fix  Procedure: The patient was identified in the preoperative holding area. Consent was confirmed with the patient and their family and all questions were answered. The operative extremity was marked after confirmation with the patient. he was then brought back to the operating room by our anesthesia colleagues.  He was carefully transferred over to radiolucent flattop table.  He was placed under general anesthetic.  A bump was placed under his operative hip.  The right lower extremity was then prepped and draped in usual sterile fashion.  A timeout was performed to verify the patient, the procedure, and the extremity.  Preoperative antibiotics were dosed.  I started out by opening the medial transverse laceration where his previous wound was.  I cleaned out the hematoma and used a curette to debride the fracture site and used cystoscopy tubing to thoroughly irrigate the wound with 3 L normal saline.  Once this was performed my gloves and instruments were changed.  Fluoroscopic imaging showed the unstable nature of his fracture.  I then proceeded to drill and placed a 5.0 mm threaded half pins bicortically in the tibial shaft.  I connected them to a pin clamp.  I then percutaneously placed a transfixion pin through the calcaneus.  I connected these 2 pin clamps and connected those to the tibial pins using 11 mm bars.  The fracture was reduced and the Ex-Fix was final tightened.  Final fluoroscopic imaging was obtained in the wound had a gram of vancomycin powder placed into it.  A layered closure of nylon was used to close the skin.  Sterile dressings were applied.  The patient was then awoke from anesthesia and taken to the PACU in stable condition.   Debridement type: Excisional Debridement  Side: right  Body Location: Ankle   Tools used  for debridement: scalpel, curette, and rongeur  Pre-debridement Wound size (cm):   Length: 6        Width: 3     Depth: 0.5   Post-debridement Wound size (cm):    n/a-closed  Debridement depth beyond dead/damaged tissue down to healthy viable tissue: yes  Tissue layer involved: skin, subcutaneous tissue, muscle / fascia, bone  Nature of tissue removed: Devitalized Tissue  Irrigation volume: 3L     Irrigation fluid type: Normal Saline   Post Op Plan/Instructions: The patient will be nonweightbearing to the right lower extremity.  He will receive postoperative Ancef.  We will get a CT scan and x-ray postoperatively.  We will plan to start him on Lovenox for DVT prophylaxis as long as his renal function stays within normal limits.  Will plan for definitive fixation possibly later in this hospitalization.  I was present and performed the entire surgery.  Thyra Breed, PA-C did assist me throughout the case. An assistant was necessary given the difficulty in approach, maintenance of reduction and ability to instrument the fracture.   Truitt Merle, MD [facility]

## 2023-05-12 NOTE — Consult Note (Signed)
 Reason for Consult:Open right ankle fx Referring Physician: Estelle [name] Time called: 1121 Time at bedside: 1126   Harold [name] is an 68 y.o. male.  HPI: Harold [name] got up this morning and his left leg buckled causing him to fall. He had immediate right ankle pain and a large wound. He was brought to the ED where x-rays showed an open ankle fx and orthopedic surgery was consulted.   Past Medical History:  Diagnosis Date   Anemia    Anginal pain (HCC) 04/2013   Cardiac cath showed patent stents with distal LAD, circumflex-OM and RCA disease in small vessels.   Anxiety    Atrial fibrillation (HCC) 12/2021   CAD S/P percutaneous coronary angioplasty 2003, 04/2012   status post PCI to LAD, circumflex-OM 2, RCA   Carotid artery occlusion    Chronic renal insufficiency, stage II (mild)    Chronic venous insufficiency    varicosities, no reflux; dopplers 04/14/12- valvular insufficiency in the R and L GSV   Complication of anesthesia    COPD, mild (HCC)    Depression    situaltional    Diabetes mellitus    Diabetic neuropathy (HCC) 08/24/2019   Dyslipidemia associated with type 2 diabetes mellitus (HCC)    Fall at home, initial encounter 01/02/2023   GERD (gastroesophageal reflux disease)    History of cardioversion 12/2021   at [facility]   HTN (hypertension)    Hypothyroidism    Neuromuscular disorder (HCC)    neuropathy in feet   Neuropathy    notably improved following PCI with improved cardiac function   Obesity    Obesity, Class II, BMI 35.0-39.9, with comorbidity (see actual BMI)    BMI 39; wgt loss efforts in place; seeing Dietician   Open ankle fracture 01/02/2023   Orthostatic hypotension    PONV (postoperative nausea and vomiting)    "Patch Works"   Pulmonary hypertension (HCC) 04/2012   PA pressure   Sleep apnea    uses nightly   Vitamin D deficiency    per facility notes    Past Surgical History:  Procedure Laterality Date   ABDOMINAL AORTAGRAM N/A  11/15/2011   Procedure: ABDOMINAL Harold [name];  Surgeon: Harold Kerns, MD;  Location: [facility] CATH LAB;  Service: Cardiovascular;  [name];   ABDOMINAL AORTOGRAM W/LOWER EXTREMITY N/A 10/13/2019   Procedure: ABDOMINAL AORTOGRAM W/LOWER EXTREMITY;  Surgeon: Harold Ouch, MD;  [facility]: [facility] INVASIVE CV LAB;  Service: CV: Non-obst Ao-Iliac. R SFA-PopA patent with significant calcific stenosis of TP trunk-prox PTA (dominant) & CTO ATA  -> R PTA-TP PTCA   ABDOMINAL AORTOGRAM W/LOWER EXTREMITY N/A 11/06/2022   Procedure: ABDOMINAL AORTOGRAM W/LOWER EXTREMITY;  Surgeon: Harold Ouch, MD;  [facility]: [facility] INVASIVE CV LAB;  Service: Cardiovascular;  [name];   ABDOMINAL AORTOGRAM W/LOWER EXTREMITY Right 04/01/2023   Procedure: ABDOMINAL AORTOGRAM W/LOWER EXTREMITY;  Surgeon: Harold Libman, MD;  [facility]: [facility] INVASIVE CV LAB;  Service: Cardiovascular;  Laterality: Right;   ABIs  04/27/2012   mild bilateral arterial insufficiency   APPLICATION OF WOUND VAC Right 03/31/2023   Procedure: APPLICATION OF WOUND VAC;  Surgeon: Harold Lofts, MD;  [name];  Service: Orthopedics;  Laterality: Right;   BACK SURGERY  2005 x1   X2-2010   BUNIONECTOMY Right 11/11/2013   Procedure: RIGHT FOOT SILVER BUNIONECTOMY;  Surgeon: Harold Arthurs, MD;  Location: [facility];  Service: Orthopedics;  Laterality: Right;   CARDIAC CATHETERIZATION  12/11/2001  significant 3V CAD, normal LV function   CARDIOVERSION N/A 03/13/2022   Procedure: CARDIOVERSION;  Surgeon: Harold Ishikawa, MD;  Location: [facility] ENDOSCOPY;  Service: Cardiovascular;  [name];   CARDIOVERSION N/A 07/25/2022   Procedure: CARDIOVERSION;  Surgeon: Harold Bathe, MD;  [facility] INVAS[facility] CV LAB;  Service: Cardiovascular;  [name];   Carotid Doppler  04/27/2012   right internal carotid: Elevated velocities but no evidence of plaque. Left internal carotid 40-59%   CAROTID ENDARTERECTOMY  2005   Right;  recent carotid Dopplers notes elevated velocities.    colonscopy     CORONARY ANGIOPLASTY  12/21/2001   PTCA of the distal and mid AV groove circ, unsuccessful PTCA of second OM total occlusion, unsuccessful PTCA of the apical LAD total occlusion   CORONARY ANGIOPLASTY WITH STENT PLACEMENT  04/29/2012   PCI to 3 RCA lesions, Promus Premiere 2.7mmx8mm distally, mid was 2.58mx28mm and proximally 2.75x36mm, EF 55-60%   CORONARY STENT PLACEMENT  04/28/2012   PCI to LAD (3x41mm Xience DES postdilated to 3.25) and circ prox and mid (2 overlappinmg 2.76mmx12mmXience DES postdilated to 2.69mm)   DOPPLER ECHOCARDIOGRAPHY  04/28/2012   poor quality study: EF estimated 60-65%; unable to assess diastolic function (previously noted to have diastolic dysfunction); severely dilated left atrium and mild right atrium; dilated IVC consistent with increased central venous pressure.Marland Kitchen   HARDWARE REMOVAL Right 03/31/2023   Procedure: HARDWARE REMOVAL RIGHT ANKLE;  Surgeon: Harold Lofts, MD;  [name];  Service: Orthopedics;  Laterality: Right;   HARDWARE REMOVAL Right 01/28/2023   Procedure: REMOVAL OF HARDWARE RIGHT ANKLE;  Surgeon: Harold Galas, MD;  [name];  Service: Orthopedics;  Laterality: Right;   HERNIA REPAIR     I & D EXTREMITY Right 01/02/2023   Procedure: IRRIGATION AND DEBRIDEMENT RIGHT ANKLE;  Surgeon: Harold Galas, MD;  [name];  Service: Orthopedics;  Laterality: Right;   I & D EXTREMITY Right 04/02/2023   Procedure: DEBRIDEMENT RIGHT ANKLE;  Surgeon: Harold Mustard, MD;  Location: [facility] OR;  Service: Orthopedics;  Laterality: Right;   IR FLUORO GUIDE CV LINE LEFT  04/22/2023   IR FLUORO GUIDE CV LINE RIGHT  04/07/2023   IR PATIENT EVAL TECH 0-60 MINS  04/07/2023   IR RADIOLOGIST EVAL & MGMT  04/08/2023   IR REMOVAL TUN CV CATH W/O FL  05/05/2023   IR US GUIDE VASC ACCESS LEFT  04/22/2023   IR US GUIDE VASC ACCESS RIGHT  04/07/2023   LEFT AND RIGHT HEART CATHETERIZATION WITH  CORONARY ANGIOGRAM N/A 04/27/2012   Procedure: LEFT AND RIGHT HEART CATHETERIZATION WITH CORONARY ANGIOGRAM;  Surgeon: Harold Lex, MD;  Location: [facility] CATH LAB;  Service: Cardiovascular;  [name];   LEFT AND RIGHT HEART CATHETERIZATION WITH CORONARY ANGIOGRAM N/A 05/14/2013   Procedure: LEFT AND RIGHT HEART CATHETERIZATION WITH CORONARY ANGIOGRAM;  Surgeon: Lennette Bihari, MD;  [name]; PCWP ;; [name], [name], Patent RCA ostial mid and distal stents - 70% distal runoff Dz   LUMBAR LAMINECTOMY/DECOMPRESSION MICRODISCECTOMY  01/07/2012   Procedure: LUMBAR LAMINECTOMY/DECOMPRESSION MICRODISCECTOMY 1 LEVEL;  Surgeon: Carmela Hurt, MD;  [name];  Service: Neurosurgery;  Laterality: Bilateral;   Lumbar Three-Four Decompression   LUMBAR LAMINECTOMY/DECOMPRESSION MICRODISCECTOMY N/A 07/26/2020   Procedure: Lumbar two-three Laminectomy/Foraminotomy;  Surgeon: Coletta Memos, MD;  [name];  Service: Neurosurgery;  [name];   ORIF ANKLE FRACTURE Right 01/02/2023   Procedure: OPEN REDUCTION INTERNAL FIXATION (ORIF)RIGHT ANKLE FRACTURE;  Surgeon: Harold Galas, MD;  [name];  Service: Orthopedics;  Laterality: Right;   PERCUTANEOUS CORONARY STENT INTERVENTION (PCI-S) N/A 04/28/2012   Procedure: PERCUTANEOUS CORONARY STENT INTERVENTION (PCI-S);  Surgeon: Lennette Bihari, MD;  Location: [facility] CATH LAB;  Service: Cardiovascular;  [name];   PERCUTANEOUS CORONARY STENT INTERVENTION (PCI-S) N/A 04/29/2012   Procedure: PERCUTANEOUS CORONARY STENT INTERVENTION (PCI-S);  Surgeon: Harold Lex, MD;  Location: [facility] CATH LAB;  Service: Cardiovascular;  [name];   PERIPHERAL VASCULAR ATHERECTOMY  11/06/2022   Procedure: PERIPHERAL VASCULAR ATHERECTOMY;  Surgeon: Harold Ouch, MD;  [facility] IN[facility]  CV LAB;  Service: Cardiovascular;;   PERIPHERAL VASCULAR BALLOON ANGIOPLASTY Right 10/13/2019   Procedure: PERIPHERAL VASCULAR BALLOON ANGIOPLASTY;  Surgeon: Harold Ouch, MD;  [facility];  Service: Cardiovascular;  Laterality: Right;  PTCA of R TP Trunk into PTA (Drug-coated) => Follow-up ABIs 0.9 on the right and 0.99 on the left.   PERIPHERAL VASCULAR BALLOON ANGIOPLASTY Right 04/01/2023   Procedure: PERIPHERAL VASCULAR BALLOON ANGIOPLASTY;  Surgeon: Harold Libman, MD;  [facility]AS[facility] CV LAB;  Service: Cardiovascular;  Laterality: Right;   PERIPHERAL VASCULAR INTERVENTION Right 04/01/2023   Procedure: PERIPHERAL VASCULAR INTERVENTION;  Surgeon: Harold Libman, MD;  [facility] CV LAB;  Service: Cardiovascular;  Laterality: Right;   SPINE SURGERY     UMBILICAL HERNIA REPAIR  2009   steel mesh insert    Family History  Adopted: Yes  Family history unknown: Yes    Social History:  reports that he quit smoking about 20 years ago. His smoking use included cigarettes. He started smoking about 62 years ago. He has a 42 pack-year smoking history. He has never used smokeless tobacco. He reports that he does not drink alcohol and does not use drugs.  Allergies:  Allergies  Allergen Reactions   Doxycycline Hives and Rash   Septra [Bactrim] Itching   Penicillins Rash    Allergy to All cillin drugs  Ancef given 9/24 with no obvious reaction   Metformin And Related Diarrhea and Nausea Only   Other Nausea And Vomiting    General Anesthesia - sometimes causes nausea and vomiting * OK to use scopolamine patch     Sulfamethoxazole-Trimethoprim Other (See Comments)   Wellbutrin [Bupropion]     Mood Changes    Medications: I have reviewed the patient's current medications.  Results for orders placed or performed during the hospital encounter of 05/12/23 (from the past 48 hours)  CBC     Status: Abnormal   Collection Time: 05/12/23  9:47 AM  Result  Value Ref Range   WBC 6.9 4.0 - 10.5 K/uL   RBC 2.98 (L) 4.22 - 5.81 MIL/uL   Hemoglobin 9.0 (L) 13.0 - 17.0 g/dL   HCT 40.9 (L) 81.1 - 91.4 %   MCV 92.3 80.0 - 100.0 fL   MCH 30.2 26.0 - 34.0 pg   MCHC 32.7 30.0 - 36.0 g/dL   RDW 78.2 (H) 95.6 - 21.3 %   Platelets 162 150 - 400 K/uL   nRBC 0.0 0.0 - 0.2 %    Comment: Performed at [facility], [address] [place], [place] [address]  Basic metabolic panel     Status: Abnormal   Collection Time: 05/12/23  9:47 AM  Result  Value Ref Range   Sodium 138 135 - 145 mmol/L   Potassium 3.8 3.5 - 5.1 mmol/L   Chloride 101 98 - 111 mmol/L   CO2 25 22 - 32 mmol/L   Glucose, Bld 229 (H) 70 - 99 mg/dL    Comment: Glucose reference range applies only to samples taken after fasting for at least 8 hours.   BUN 29 (H) 8 - 23 mg/dL   Creatinine, Ser 3.24 (H) 0.61 - 1.24 mg/dL   Calcium 8.7 (L) 8.9 - 10.3 mg/dL   GFR, Estimated 17 (L) >60 mL/min    Comment: (NOTE) Calculated using the CKD-EPI Creatinine Equation (2021)    Anion gap 12 5 - 15    Comment: Performed at [facility], [address], [place], [place] [address]  Protime-INR     Status: Abnormal   Collection Time: 05/12/23  9:47 AM  Result Value Ref Range   Prothrombin Time 18.5 (H) 11.4 - 15.2 seconds   INR 1.5 (H) 0.8 - 1.2    Comment: (NOTE) INR goal varies based on device and disease states. Performed at [facility], [address] [place], [place] [address]    *Note: Due to a large number of results and/or encounters for the requested time period, some results have not been displayed. A complete set of results can be found in Results Review.    DG Ankle Complete Right Result Date: 05/12/2023 CLINICAL DATA:  sp fall, open fracture; sp fall with open fracture. EXAM: RIGHT ANKLE - COMPLETE 3+ VIEW; RIGHT TIBIA AND FIBULA - 2 VIEW COMPARISON:  None Available. FINDINGS: There is comminuted and displaced fracture of the lower diaphysis of the fibula. There  are ghost tracks in the fibula from previously removed hardware. There is faint ossific density surrounding the fracture site which may represent underlying heterotopic ossification. There are also age indeterminate fractures of the medial malleolus and posterior malleolus. No other acute fracture or dislocation. No aggressive osseous lesion. Ankle mortise appears intact. Mild-to-moderate diffuse soft tissue swelling overlying the medial malleolus. Vascular stent noted along the lower third femur. No radiopaque foreign bodies. IMPRESSION: *Comminuted and displaced fracture of the lower diaphysis of the fibula. *Age indeterminate fractures of the medial and posterior malleoli. Electronically Signed   By: Jules Schick M.D.   On: 05/12/2023 12:01   DG Tibia/Fibula Right Result Date: 05/12/2023 CLINICAL DATA:  sp fall, open fracture; sp fall with open fracture. EXAM: RIGHT ANKLE - COMPLETE 3+ VIEW; RIGHT TIBIA AND FIBULA - 2 VIEW COMPARISON:  None Available. FINDINGS: There is comminuted and displaced fracture of the lower diaphysis of the fibula. There are ghost tracks in the fibula from previously removed hardware. There is faint ossific density surrounding the fracture site which may represent underlying heterotopic ossification. There are also age indeterminate fractures of the medial malleolus and posterior malleolus. No other acute fracture or dislocation. No aggressive osseous lesion. Ankle mortise appears intact. Mild-to-moderate diffuse soft tissue swelling overlying the medial malleolus. Vascular stent noted along the lower third femur. No radiopaque foreign bodies. IMPRESSION: *Comminuted and displaced fracture of the lower diaphysis of the fibula. *Age indeterminate fractures of the medial and posterior malleoli. Electronically Signed   By: Jules Schick M.D.   On: 05/12/2023 12:01    Review of Systems  HENT:  Negative for ear discharge, ear pain, hearing loss and tinnitus.   Eyes:  Negative for  photophobia and pain.  Respiratory:  Negative for cough and  shortness of breath.   Cardiovascular:  Negative for chest pain.  Gastrointestinal:  Negative for abdominal pain, nausea and vomiting.  Genitourinary:  Negative for dysuria, flank pain, frequency and urgency.  Musculoskeletal:  Positive for arthralgias (Right ankle). Negative for back pain, myalgias and neck pain.  Neurological:  Negative for dizziness and headaches.  Hematological:  Does not bruise/bleed easily.  Psychiatric/Behavioral:  The patient is not nervous/anxious.    Blood pressure (!) 169/48, pulse 74, temperature 98.1 F (36.7 C), temperature source Oral, resp. rate 16, height 6\' 1"  (1.854 m), weight 136.1 kg, SpO2 100%. Physical Exam Constitutional:      General: He is not in acute distress.    Appearance: He is well-developed. He is not diaphoretic.  HENT:     Head: Normocephalic and atraumatic.  Eyes:     General: No scleral icterus.       Right eye: No discharge.        Left eye: No discharge.     Conjunctiva/sclera: Conjunctivae normal.  Cardiovascular:     Rate and Rhythm: Normal rate and regular rhythm.  Pulmonary:     Effort: Pulmonary effort is normal. No respiratory distress.  Musculoskeletal:     Cervical back: Normal range of motion.     Comments: RLE Transverse lac medial ankle, no ecchymosis or rash  Mod TTP  No knee effusion  Knee stable to varus/ valgus and anterior/posterior stress  Sens DPN, SPN, TN intact  Motor EHL, ext, flex, evers 5/5  Toes perfused, No significant edema  Skin:    General: Skin is warm and dry.  Neurological:     Mental Status: He is alert.  Psychiatric:        Mood and Affect: Mood normal.        Behavior: Behavior normal.     Assessment/Plan: Open right ankle fx -- Plan I&D, ex fix vs ORIF either later today with Dr. Jena Gauss or tomorrow with Dr. Carola Frost. Please keep NPO for now.    Freeman Caldron, PA-C Orthopedic Surgery [phone] 05/12/2023, 1:37  PM

## 2023-05-12 NOTE — Assessment & Plan Note (Addendum)
 IV PPI therapy and aspiration precaution.

## 2023-05-13 ENCOUNTER — Inpatient Hospital Stay (HOSPITAL_COMMUNITY)

## 2023-05-13 ENCOUNTER — Encounter (HOSPITAL_COMMUNITY): Payer: Self-pay | Admitting: Student

## 2023-05-13 DIAGNOSIS — S82891S Other fracture of right lower leg, sequela: Secondary | ICD-10-CM

## 2023-05-13 LAB — COMPREHENSIVE METABOLIC PANEL
ALT: 9 U/L (ref 0–44)
AST: 11 U/L — ABNORMAL LOW (ref 15–41)
Albumin: 2.6 g/dL — ABNORMAL LOW (ref 3.5–5.0)
Alkaline Phosphatase: 73 U/L (ref 38–126)
Anion gap: 10 (ref 5–15)
BUN: 30 mg/dL — ABNORMAL HIGH (ref 8–23)
CO2: 24 mmol/L (ref 22–32)
Calcium: 8.2 mg/dL — ABNORMAL LOW (ref 8.9–10.3)
Chloride: 101 mmol/L (ref 98–111)
Creatinine, Ser: 3.68 mg/dL — ABNORMAL HIGH (ref 0.61–1.24)
GFR, Estimated: 17 mL/min — ABNORMAL LOW (ref >60)
Glucose, Bld: 252 mg/dL — ABNORMAL HIGH (ref 70–99)
Potassium: 4.4 mmol/L (ref 3.5–5.1)
Sodium: 135 mmol/L (ref 135–145)
Total Bilirubin: 0.6 mg/dL (ref 0.0–1.2)
Total Protein: 5.7 g/dL — ABNORMAL LOW (ref 6.5–8.1)

## 2023-05-13 LAB — MAGNESIUM: Magnesium: 1.4 mg/dL — ABNORMAL LOW (ref 1.7–2.4)

## 2023-05-13 LAB — CBC
HCT: 21.7 % — ABNORMAL LOW (ref 39.0–52.0)
Hemoglobin: 7 g/dL — ABNORMAL LOW (ref 13.0–17.0)
MCH: 29.7 pg (ref 26.0–34.0)
MCHC: 32.3 g/dL (ref 30.0–36.0)
MCV: 91.9 fL (ref 80.0–100.0)
Platelets: 135 10*3/uL — ABNORMAL LOW (ref 150–400)
RBC: 2.36 MIL/uL — ABNORMAL LOW (ref 4.22–5.81)
RDW: 16.3 % — ABNORMAL HIGH (ref 11.5–15.5)
WBC: 7.1 10*3/uL (ref 4.0–10.5)
nRBC: 0 % (ref 0.0–0.2)

## 2023-05-13 LAB — GLUCOSE, CAPILLARY
Glucose-Capillary: 213 mg/dL — ABNORMAL HIGH (ref 70–99)
Glucose-Capillary: 204 mg/dL — ABNORMAL HIGH (ref 70–99)
Glucose-Capillary: 180 mg/dL — ABNORMAL HIGH (ref 70–99)

## 2023-05-13 LAB — PHOSPHORUS: Phosphorus: 4 mg/dL (ref 2.5–4.6)

## 2023-05-13 LAB — PREPARE RBC (CROSSMATCH)

## 2023-05-13 MED ORDER — SODIUM CHLORIDE 0.9% FLUSH
10.0000 mL | INTRAVENOUS | Status: DC | PRN
  Administered 2023-05-17: 10 mL

## 2023-05-13 MED ORDER — CHLORHEXIDINE GLUCONATE CLOTH 2 % EX PADS
6.0000 | MEDICATED_PAD | Freq: Every day | CUTANEOUS | Status: DC
  Administered 2023-05-13 – 2023-05-21 (×8): 6 via TOPICAL

## 2023-05-13 MED ORDER — FERROUS SULFATE 325 (65 FE) MG PO TABS
325.0000 mg | ORAL_TABLET | Freq: Every day | ORAL | Status: DC
  Administered 2023-05-13 – 2023-05-20 (×8): 325 mg via ORAL
  Filled 2023-05-13 (×8): qty 1

## 2023-05-13 MED ORDER — SODIUM CHLORIDE 0.9% IV SOLUTION: Freq: Once | INTRAVENOUS | Status: AC

## 2023-05-13 MED ORDER — INSULIN ASPART 100 UNIT/ML IJ SOLN: 0.0000 [IU] | Freq: Every day | INTRAMUSCULAR | Status: DC

## 2023-05-13 MED ORDER — SODIUM CHLORIDE 0.9% FLUSH
10.0000 mL | Freq: Two times a day (BID) | INTRAVENOUS | Status: DC
  Administered 2023-05-13 – 2023-05-15 (×3): 10 mL

## 2023-05-13 MED ORDER — MAGNESIUM SULFATE 2 GM/50ML IV SOLN
2.0000 g | Freq: Once | INTRAVENOUS | Status: AC
  Administered 2023-05-13: 2 g via INTRAVENOUS
  Filled 2023-05-13: qty 50

## 2023-05-13 MED ORDER — ENSURE ENLIVE PO LIQD
237.0000 mL | Freq: Two times a day (BID) | ORAL | Status: DC
  Administered 2023-05-13 – 2023-05-20 (×7): 237 mL via ORAL

## 2023-05-13 MED ORDER — VITAMIN B-12 1000 MCG PO TABS
1000.0000 ug | ORAL_TABLET | Freq: Every day | ORAL | Status: DC
  Administered 2023-05-15 – 2023-05-20 (×6): 1000 ug via ORAL
  Filled 2023-05-13 (×6): qty 1

## 2023-05-13 MED ORDER — CYANOCOBALAMIN 1000 MCG/ML IJ SOLN
1000.0000 ug | Freq: Every day | INTRAMUSCULAR | Status: AC
  Administered 2023-05-13 – 2023-05-14 (×2): 1000 ug via SUBCUTANEOUS
  Filled 2023-05-13 (×3): qty 1

## 2023-05-13 MED ORDER — INSULIN ASPART 100 UNIT/ML IJ SOLN
0.0000 [IU] | Freq: Three times a day (TID) | INTRAMUSCULAR | Status: DC
  Administered 2023-05-13 (×2): 5 [IU] via SUBCUTANEOUS
  Administered 2023-05-14: 3 [IU] via SUBCUTANEOUS

## 2023-05-13 MED ORDER — VITAMIN D (ERGOCALCIFEROL) 1.25 MG (50000 UNIT) PO CAPS
50000.0000 [IU] | ORAL_CAPSULE | ORAL | Status: DC
  Administered 2023-05-13 – 2023-05-20 (×2): 50000 [IU] via ORAL
  Filled 2023-05-13 (×2): qty 1

## 2023-05-13 MED ORDER — ENOXAPARIN SODIUM 30 MG/0.3ML IJ SOSY
30.0000 mg | PREFILLED_SYRINGE | INTRAMUSCULAR | Status: DC
  Administered 2023-05-14: 30 mg via SUBCUTANEOUS
  Filled 2023-05-13: qty 0.3

## 2023-05-13 MED ORDER — COLCHICINE 0.6 MG PO TABS
0.6000 mg | ORAL_TABLET | ORAL | Status: DC
  Administered 2023-05-13 – 2023-05-21 (×4): 0.6 mg via ORAL
  Filled 2023-05-13 (×4): qty 1

## 2023-05-13 MED ORDER — B COMPLEX-C PO TABS
1.0000 | ORAL_TABLET | Freq: Every day | ORAL | Status: DC
  Administered 2023-05-13 – 2023-05-20 (×7): 1 via ORAL
  Filled 2023-05-13 (×10): qty 1

## 2023-05-13 NOTE — Evaluation (Signed)
 Physical Therapy Evaluation Patient Details Name: Harold [name] MRN: [id number] DOB: 1955-10-09 Today's Date: 05/13/2023  History of Present Illness  Pt admitted to [facility] 05/12/23 for R ankle pain s/p repeated falls at home. X-ray showed open ankle fx. Pt recently dc after 44 days in hospital. External fixation performed 3/24. NWB RLE.  PMH: HTN, HLD, CAD s/p PCI, COPD, DM2, CKD stage IIIb, PVD, gout, OSA, neuropathy, COPD, a-fib, CAD s/p percutaneous coronary angioplasty, angina, obesity  Clinical Impression   Pt presents with RLE pain, R flank pain, difficulty performing bed mobility tasks, and decreased activity tolerance. Pt to benefit from acute PT to address deficits. Pt requiring mod +2 assist for boost up in bed and repositioning for pressure relief, pt unable to progress to EOB or OOB given pain and fatigue. Patient will benefit from continued inpatient follow up therapy, <3 hours/day. PT to progress mobility as tolerated, and will continue to follow acutely.          If plan is discharge home, recommend the following: Assistance with cooking/housework;Assist for transportation;Help with stairs or ramp for entrance;Two people to help with walking and/or transfers;Two people to help with bathing/dressing/bathroom   Can travel by private vehicle        Equipment Recommendations None recommended by PT  Recommendations for Other Services       Functional Status Assessment Patient has had a recent decline in their functional status and demonstrates the ability to make significant improvements in function in a reasonable and predictable amount of time.     Precautions / Restrictions Precautions Precautions: Fall Recall of Precautions/Restrictions: Impaired Precaution/Restrictions Comments: Watch BP Required Braces or Orthoses: Other Brace (Ex Fix) Restrictions Weight Bearing Restrictions Per Provider Order: Yes RLE Weight Bearing Per Provider Order: Non weight bearing       Mobility  Bed Mobility Overal bed mobility: Needs Assistance Bed Mobility:  (Scoot up in bed)           General bed mobility comments: Mod assist to scoot up in bed +2 to manage ex fix    Transfers                   General transfer comment: NT - pain    Ambulation/Gait                  Stairs            Wheelchair Mobility     Tilt Bed    Modified Rankin (Stroke Patients Only)       Balance Overall balance assessment: Needs assistance                                           Pertinent Vitals/Pain Pain Assessment Pain Assessment: 0-10 Pain Score: 5  Pain Location: R foot Pain Descriptors / Indicators: Discomfort, Grimacing Pain Intervention(s): Monitored during session, Limited activity within patient's tolerance, Repositioned    Home Living Family/patient expects to be discharged to:: Private residence Living Arrangements: Spouse/significant other Available Help at Discharge: Family Type of Home: House Home Access: Stairs to enter Entrance Stairs-Rails: None Entrance Stairs-Number of Steps: 3   Home Layout: One level Home Equipment: Pharmacist, hospital (2 wheels);Transport chair;Grab bars - toilet;Grab bars - tub/shower;BSC/3in1 Additional Comments: Pt reports spouse works from home and can assist as needed    Prior Function Prior Level of Function : History  of Falls (last six months);Needs assist               ADLs Comments: amy in-house RN comes at least 1x/week for 2-3 hours to change bandage and washing. has been getting HHPT as well     Extremity/Trunk Assessment   Upper Extremity Assessment Upper Extremity Assessment: Defer to OT evaluation    Lower Extremity Assessment Lower Extremity Assessment: Generalized weakness;RLE deficits/detail RLE Deficits / Details: ex fix; able to perform knee flexion/extension 50% AAROM limited by pain, toe flex/ext RLE: Unable to fully assess due to  immobilization;Unable to fully assess due to pain LLE Sensation: history of peripheral neuropathy    Cervical / Trunk Assessment Cervical / Trunk Assessment: Kyphotic Cervical / Trunk Exceptions: body habitus  Communication   Communication Communication: No apparent difficulties    Cognition Arousal: Alert Behavior During Therapy: Flat affect   PT - Cognitive impairments: Safety/Judgement                         Following commands: Intact Following commands impaired: Follows one step commands with increased time     Cueing Cueing Techniques: Verbal cues, Tactile cues     General Comments General comments (skin integrity, edema, etc.): Ex fix in place    Exercises     Assessment/Plan    PT Assessment Patient needs continued PT services  PT Problem List Decreased strength;Decreased range of motion;Decreased activity tolerance;Decreased balance;Decreased mobility;Decreased coordination;Decreased cognition;Decreased knowledge of use of DME;Decreased safety awareness;Decreased knowledge of precautions;Cardiopulmonary status limiting activity;Obesity       PT Treatment Interventions DME instruction;Gait training;Functional mobility training;Therapeutic activities;Therapeutic exercise;Balance training;Neuromuscular re-education;Patient/family education    PT Goals (Current goals can be found in the Care Plan section)  Acute Rehab PT Goals PT Goal Formulation: With patient Time For Goal Achievement: 05/27/23 Potential to Achieve Goals: Good    Frequency Min 3X/week     Co-evaluation PT/OT/SLP Co-Evaluation/Treatment: Yes Reason for Co-Treatment: For patient/therapist safety;To address functional/ADL transfers PT goals addressed during session: Balance;Mobility/safety with mobility OT goals addressed during session: ADL's and self-care;Strengthening/ROM       AM-PAC PT "6 Clicks" Mobility  Outcome Measure Help needed turning from your back to your side  while in a flat bed without using bedrails?: A Lot Help needed moving from lying on your back to sitting on the side of a flat bed without using bedrails?: A Lot Help needed moving to and from a bed to a chair (including a wheelchair)?: Total Help needed standing up from a chair using your arms (e.g., wheelchair or bedside chair)?: Total Help needed to walk in hospital room?: Total Help needed climbing 3-5 steps with a railing? : Total 6 Click Score: 8    End of Session   Activity Tolerance: Patient limited by fatigue;Patient limited by pain Patient left: in bed;with call bell/phone within reach;with bed alarm set Nurse Communication: Mobility status PT Visit Diagnosis: Other abnormalities of gait and mobility (R26.89);Muscle weakness (generalized) (M62.81);Repeated falls (R29.6)    Time: 2956-2130 PT Time Calculation (min) (ACUTE ONLY): 22 min   Charges:   PT Evaluation $PT Eval Low Complexity: 1 Low   PT General Charges $$ ACUTE PT VISIT: 1 Visit         Marye Round, PT DPT Acute Rehabilitation Services Secure Chat Preferred  Office [phone]   Anessa Charley Sheliah Plane 05/13/2023, 1:57 PM

## 2023-05-13 NOTE — Progress Notes (Signed)
 Orthopaedic Trauma Progress Note  SUBJECTIVE: Doing okay this morning.  Not having much pain in the right ankle.  Most of his pain is located in the right ribs.  No imaging of the ribs or chest has been done.  Patient noted to have tenderness to the right axilla and right lateral ribs.  No significant bruising noted to this area.  Will order chest x-ray/rib series today.  Denies any numbness or tingling throughout the right lower extremity.  Tolerating the Ex-Fix.  No SOB. No nausea/vomiting. No other complaints.   OBJECTIVE:  Vitals:   05/13/23 0156 05/13/23 0442  BP: (!) 141/67 (!) 134/56  Pulse: 76 77  Resp: 18   Temp: 97.8 F (36.6 C) 99 F (37.2 C)  SpO2: 95% 90%    General: Sitting in bed, no acute distress.  Pleasant and cooperative Respiratory: No increased work of breathing.  Right lower extremity: Ex-Fix in place.  Dressing clean, dry, intact.  Decreased sensation to light touch about the foot, has neuropathy at baseline.  Endorses sensation to the proximal tibia, knee, thigh.  Able to wiggle toes. + DP pulse  IMAGING: Stable post op imaging.   LABS:  Results for orders placed or performed during the hospital encounter of 05/12/23 (from the past 24 hours)  Surgical pcr screen     Status: None   Collection Time: 05/12/23  5:04 PM   Specimen: Nasal Mucosa; Nasal Swab  Result Value Ref Range   MRSA, PCR NEGATIVE NEGATIVE   Staphylococcus aureus NEGATIVE NEGATIVE  Glucose, capillary     Status: Abnormal   Collection Time: 05/12/23  5:30 PM  Result Value Ref Range   Glucose-Capillary 205 (H) 70 - 99 mg/dL  Glucose, capillary     Status: Abnormal   Collection Time: 05/12/23  6:57 PM  Result Value Ref Range   Glucose-Capillary 215 (H) 70 - 99 mg/dL  Magnesium     Status: Abnormal   Collection Time: 05/13/23  6:48 AM  Result Value Ref Range   Magnesium 1.4 (L) 1.7 - 2.4 mg/dL  Comprehensive metabolic panel     Status: Abnormal   Collection Time: 05/13/23  6:48 AM  Result  Value Ref Range   Sodium 135 135 - 145 mmol/L   Potassium 4.4 3.5 - 5.1 mmol/L   Chloride 101 98 - 111 mmol/L   CO2 24 22 - 32 mmol/L   Glucose, Bld 252 (H) 70 - 99 mg/dL   BUN 30 (H) 8 - 23 mg/dL   Creatinine, Ser 8.29 (H) 0.61 - 1.24 mg/dL   Calcium 8.2 (L) 8.9 - 10.3 mg/dL   Total Protein 5.7 (L) 6.5 - 8.1 g/dL   Albumin 2.6 (L) 3.5 - 5.0 g/dL   AST 11 (L) 15 - 41 U/L   ALT 9 0 - 44 U/L   Alkaline Phosphatase 73 38 - 126 U/L   Total Bilirubin 0.6 0.0 - 1.2 mg/dL   GFR, Estimated 17 (L) >60 mL/min   Anion gap 10 5 - 15  CBC     Status: Abnormal   Collection Time: 05/13/23  6:48 AM  Result Value Ref Range   WBC 7.1 4.0 - 10.5 K/uL   RBC 2.36 (L) 4.22 - 5.81 MIL/uL   Hemoglobin 7.0 (L) 13.0 - 17.0 g/dL   HCT 56.2 (L) 13.0 - 86.5 %   MCV 91.9 80.0 - 100.0 fL   MCH 29.7 26.0 - 34.0 pg   MCHC 32.3 30.0 - 36.0 g/dL  RDW 16.3 (H) 11.5 - 15.5 %   Platelets 135 (L) 150 - 400 K/uL   nRBC 0.0 0.0 - 0.2 %  Phosphorus     Status: None   Collection Time: 05/13/23  6:48 AM  Result Value Ref Range   Phosphorus 4.0 2.5 - 4.6 mg/dL  Type and screen [facility]     Status: None (Preliminary result)   Collection Time: 05/13/23  9:28 AM  Result Value Ref Range   ABO/RH(D) PENDING    Antibody Screen PENDING    Sample Expiration      05/16/2023,2359 Performed at [facility], [address] [place], [place] [address]    *Note: Due to a large number of results and/or encounters for the requested time period, some results have not been displayed. A complete set of results can be found in Results Review.    ASSESSMENT: Harold [name] is a 68 y.o. male, 1 Day Post-Op s/p IRRIGATION AND DEBRIDEMENT WITH EXTERNAL FIXATION RIGHT TRIMALLEOLAR ANKLE FRACTURE  CV/Blood loss: Acute blood loss anemia, Hgb 7.0 this morning.  1 unit PRBCs ordered per primary team. Hemodynamically stable  PLAN: Weightbearing: NWB RLE ROM: Maintain Ex-Fix.  Okay for knee motion as  tolerated Incisional and dressing care: Reinforce dressings as needed  Showering: Bed bath Orthopedic device(s): Ex-Fix RLE Pain management:  1. Tylenol 650 mg q 6 hours PRN 2. Robaxin 500 mg q 6 hours PRN 3. Oxycodone 5 mg q 4 hours PRN 4. Dilaudid 0.5-1 mg q 4 hours PRN 5.  Neurontin 300 mg nightly VTE prophylaxis: Lovenox, SCDs ID:  Ancef 2gm post op Foley/Lines:  No foley, KVO IVFs Impediments to Fracture Healing: Vitamin D level 28 when checked February 2025, continue supplementation Dispo: PT/OT evaluation today.  Follow-up on chest/rib x-rays.  Plan for return to the OR with Dr. Jena Gauss on Thursday (05/15/2023) for removal of external fixation and definitive fixation right ankle  Follow - up plan: 2 weeks after discharge for wound check and repeat x-rays   Contact information:  Truitt Merle MD, Thyra Breed PA-C. After hours and holidays please check [email or web address] for group call information for [facility]   Thompson Caul, PA-C [phone] (office) [email or web address]

## 2023-05-13 NOTE — Progress Notes (Signed)
 [facility] Progress Note Patient: Harold [name]:[id number] DOB: 1956/02/02 DOA: 05/12/2023  DOS: the patient was seen and examined on 05/13/2023  Brief Hospital Course: Patient with PMH of obesity, OSA, type II DM, HTN, HLD, CAD SP PCI, P A-fib on Eliquis, PAD, CKD stage IV, GERD, COPD, PHTN.  Presented to the hospital with ankle injury.  Trending out of her recliner and slept and had a fall. Was discharged from the hospital on 3/22. Orthopedic was consulted.  Currently underwent external fixation of the right ankle and closed reduction of ankle fracture. Plan for repeat surgery on 3/27. Assessment and Plan: Right open trimalleolar ankle fracture. Recurrent injury secondary to recurrent fall. 12/2022 underwent ORIF for ankle fracture, revision in December 2024 for broken hardware. 2/10 underwent hardware removal due to infection 2/12 underwent debridement with Dr. Lajoyce Corners. Now readmitted with fracture, underwent external fixation of the right ankle on 3/24. Plan for return to the OR on 3/27 for removal of the external fixation and definitive fixation of the right ankle. Pain management and other plan for orthopedics.  Postop acute blood loss anemia. 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 Baseline hemoglobin around 8.  Postop dropped down to 7.  Receiving 1 PRBC transfusion. Transfer hemoglobin less than 8.  Recently treated right leg infection. Currently stable.  At risk for further infection.  Monitor.  CKD stage IV. Renal function stable. On discharge serum creatinine was around 4. Currently serum creatinine is around 3.6. Required HD during last admission.  Hypomagnesemia. Will be replacing.  Paroxysmal A-fib. On Eliquis.  On amiodarone.  Anticoagulation on hold.  Resume per orthopedics.  CAD SP PCI. No chest pain right now. Monitor on telemetry. Continue Plavix.  OSA on CPAP. Will continue CPAP nightly.  Anxiety and depression. On Cymbalta.   Monitor.  Hypotension. On midodrine.  GERD. Continue PPI.  PAD. Vascular surgery was consulted on last admission. On Plavix and Zetia.  Diabetic neuropathy. On gabapentin.  Type II DM, uncontrolled with hyperglycemia with long-term insulin use with diabetic neuropathy and nephropathy. Hemoglobin A1c is 7. Currently on sliding scale insulin and Lantus.  Morbid obesity. Placing the patient at high risk for poor outcome. Monitor.  Chronic gout. On colchicine. Dose adjusted based on renal function to every other day.  Subjective: Pain still present.  No nausea no vomiting no fever no chills.  Placed on oxygen this morning.  Physical Exam: General: in Mild distress, No Rash Cardiovascular: S1 and S2 Present, No Murmur Respiratory: Good respiratory effort, Bilateral Air entry present.  Faint basal crackles, No wheezes Abdomen: Bowel Sound present, No tenderness Extremities: Right edema Neuro: Alert and oriented x3, no new focal deficit  Data Reviewed: I have Reviewed nursing notes, Vitals, and Lab results. Since last encounter, pertinent lab results CBC and BMP   . I have ordered test including CBC and BMP  .   Disposition: Status is: Inpatient Remains inpatient appropriate because: Monitor for postop recovery.  Disposition has been difficult during last admission with insurance denying multiple times placement.  Patient returned to the hospital in short time after discharge from recent hospitalization.  Unsafe to go home.  enoxaparin (LOVENOX) injection 30 mg Start: 05/14/23 1000 SCDs Start: 05/12/23 1916   Family Communication: No one at bedside Level of care: Telemetry Surgical   Vitals:   05/13/23 1312 05/13/23 1412 05/13/23 1524 05/13/23 1720  BP: (!) 147/54 (!) 142/58 (!) 141/54 (!) 142/50  Pulse: 70 69 69 (!) 53  Resp:  18  Temp: 97.7 F (36.5 C) 98.7 F (37.1 C) 97.8 F (36.6 C) 98.2 F (36.8 C)  TempSrc: Oral Oral Oral   SpO2: 100% 100% 95% 97%   Weight:      Height:         Author: Lynden Oxford, MD 05/13/2023 6:26 PM  Please look on [email or web address] to find out who is on call.

## 2023-05-13 NOTE — Progress Notes (Signed)
 Transition of Care [facility]) - CAGE-AID Screening   Patient Details  Name: Harold [name] MRN: [id number] Date of Birth: 1955-10-03  Transition of Care [facility]) CM/SW Contact:    Leota Sauers, RN Phone Number: 05/13/2023, 8:26 PM   Clinical Narrative:  Patient denies the use of alcohol and illicit substances. Resources not given at this time.  CAGE-AID Screening:    Have You Ever Felt You Ought to Cut Down on Your Drinking or Drug Use?: No Have People Annoyed You By Critizing Your Drinking Or Drug Use?: No Have You Felt Bad Or Guilty About Your Drinking Or Drug Use?: No Have You Ever Had a Drink or Used Drugs First Thing In The Morning to Steady Your Nerves or to Get Rid of a Hangover?: No CAGE-AID Score: 0  Substance Abuse Education Offered: No

## 2023-05-13 NOTE — Evaluation (Signed)
 Occupational Therapy Evaluation Patient Details Name: Harold [name] MRN: [id number] DOB: Jan 14, 1956 Today's Date: 05/13/2023   History of Present Illness   Pt admitted to [facility] 05/12/23 for R ankle pain. X-ray showed open ankle fx. Pt recently dc after 44 days in hospital. External fixation performed 3/24. NWB RLE.  PMH: HTN, HLD, CAD s/p PCI, COPD, DM2, CKD stage IIIb, PVD, gout, OSA, neuropathy, COPD, a-fib, CAD s/p percutaneous coronary angioplasty, angina, obesity     Clinical Impressions Pt admitted based on above, and was seen based on problem list below. PTA pt was living with spouse and   receiving min to mod assistance with ADLs and IADLs. Today pt is requiring set up  to total for  ADLs. No functional mobility completed d/t pt's fatigue and pain levels. Recommendation of <3 hours of skilled rehab daily. OT will continue to follow acutely to maximize functional independence.        If plan is discharge home, recommend the following:   A lot of help with bathing/dressing/bathroom;Assistance with cooking/housework;Help with stairs or ramp for entrance;Assist for transportation;Two people to help with walking and/or transfers     Functional Status Assessment   Patient has had a recent decline in their functional status and demonstrates the ability to make significant improvements in function in a reasonable and predictable amount of time.     Equipment Recommendations   Other (comment) (Defer to next venue)     Recommendations for Other Services         Precautions/Restrictions   Precautions Precautions: Fall Recall of Precautions/Restrictions: Impaired Precaution/Restrictions Comments: Watch BP Required Braces or Orthoses: Other Brace (Ex Fix) Restrictions Weight Bearing Restrictions Per Provider Order: Yes RLE Weight Bearing Per Provider Order: Non weight bearing     Mobility Bed Mobility Overal bed mobility: Needs Assistance Bed Mobility:  (Scoot  up in bed)           General bed mobility comments: Mod assist to scoot up in bed +2 to manage ex fix    Transfers Overall transfer level: Needs assistance                 General transfer comment: Not safe to attemmpt d/t pt's fatigue levels      Balance Overall balance assessment: Needs assistance                                         ADL either performed or assessed with clinical judgement   ADL Overall ADL's : Needs assistance/impaired Eating/Feeding: Set up;Bed level   Grooming: Set up;Bed level           Upper Body Dressing : Set up;Bed level   Lower Body Dressing: Total assistance;Sit to/from stand                 General ADL Comments: Limited eval d/t pain and pt fatigue levels. Pt reports receiving min assist at baseline with LB ADLs     Vision Baseline Vision/History: 1 Wears glasses       Perception         Praxis         Pertinent Vitals/Pain Pain Assessment Pain Assessment: 0-10 Pain Score: 5  Pain Location: R foot Pain Descriptors / Indicators: Discomfort, Grimacing Pain Intervention(s): Limited activity within patient's tolerance, Premedicated before session, Repositioned     Extremity/Trunk Assessment Upper Extremity Assessment Upper Extremity Assessment:  Overall [facility] for tasks assessed;Left hand dominant   Lower Extremity Assessment Lower Extremity Assessment: Defer to PT evaluation       Communication Communication Communication: No apparent difficulties   Cognition Arousal: Alert Behavior During Therapy: Flat affect Cognition: No apparent impairments                               Following commands: Intact Following commands impaired: Follows one step commands with increased time     Cueing  General Comments   Cueing Techniques: Verbal cues;Tactile cues  Ex fix in place   Exercises     Shoulder Instructions      Home Living Family/patient expects to be discharged  to:: Private residence Living Arrangements: Spouse/significant other Available Help at Discharge: Family Type of Home: House Home Access: Stairs to enter Secretary/administrator of Steps: 3 Entrance Stairs-Rails: None Home Layout: One level     Bathroom Shower/Tub: Tub/shower unit;Sponge bathes at baseline   [employer]: Handicapped height Bathroom Accessibility: Yes How Accessible: Accessible via walker Home Equipment: Pharmacist, hospital (2 wheels);Transport chair;Grab bars - toilet;Grab bars - tub/shower;BSC/3in1   Additional Comments: Pt reports spouse works from home and can assist as needed      Prior Functioning/Environment Prior Level of Function : History of Falls (last six months);Needs assist               ADLs Comments: amy in-house RN comes at least 1x/week for 2-3 hours to change bandage and washing. has been getting HHPT as well    OT Problem List: Decreased strength;Decreased activity tolerance;Impaired balance (sitting and/or standing);Obesity;Decreased knowledge of precautions;Decreased knowledge of use of DME or AE;Decreased safety awareness   OT Treatment/Interventions: Self-care/ADL training;Therapeutic exercise;DME and/or AE instruction;Therapeutic activities;Patient/family education;Balance training;Energy conservation      OT Goals([name] goals can be found in the care plan section)   Acute Rehab OT Goals Patient Stated Goal: To feel better OT Goal Formulation: With patient Time For Goal Achievement: 05/27/23 Potential to Achieve Goals: Good   OT Frequency:  Min 2X/week    Co-evaluation PT/OT/SLP Co-Evaluation/Treatment: Yes Reason for Co-Treatment: For patient/therapist safety;To address functional/ADL transfers   OT goals addressed during session: ADL's and self-care;Strengthening/ROM      AM-PAC OT "6 Clicks" Daily Activity     Outcome Measure Help from another person eating meals?: None Help from another person taking  care of personal grooming?: A Little Help from another person toileting, which includes using toliet, bedpan, or urinal?: Total Help from another person bathing (including washing, rinsing, drying)?: A Lot Help from another person to put on and taking off regular upper body clothing?: A Little Help from another person to put on and taking off regular lower body clothing?: Total 6 Click Score: 14   End of Session Nurse Communication: Mobility status  Activity Tolerance: Patient limited by fatigue;Patient limited by pain Patient left: in bed;with call bell/phone within reach;with bed alarm set  OT Visit Diagnosis: Unsteadiness on feet (R26.81);Other abnormalities of gait and mobility (R26.89);Muscle weakness (generalized) (M62.81) Pain - Right/Left: Right Pain - part of body: Ankle and joints of foot                Time: 1610-9604 OT Time Calculation (min): 17 min Charges:  OT General Charges $OT Visit: 1 Visit OT Evaluation $OT Eval Moderate Complexity: 1 Mod  Harold [name], OT  Acute Rehabilitation Services Office [phone] Secure chat preferred  Harold [name] 05/13/2023, 1:45 PM

## 2023-05-13 NOTE — Consult Note (Signed)
 [facility] Liaison Consult Note   05/13/2023  KYRO JOSWICK 1955/10/29 [id number]  Insurance: [employer]  Primary Care Provider: [name], MD listed with [facility], this provider is listed for the transition of care follow up appointments.   [facility] Liaison met patient at bedside at [facility]. Patient in bed with new right ankle fracture he states and states he may have a few rib fractures. He confirms PCP.  This provider only has VBCI for pharmacy follow up for patients.  Patient denies any pharmacy issues currently.    The patient was screened for 7 and 30 day readmission hospitalization with noted extreme risk score for unplanned readmission risk 4 hospital admissions in 6 months.  The patient was assessed for potential [facility] Coordination service needs for post hospital transition for care coordination. Review of patient's electronic medical record reveals patient is readmission due to fall and fracture..   Plan: [facility] Liaison will continue to follow progress and disposition to assess for post hospital community care coordination/management needs.  Referral request for community care coordination: None anticipated and pending disposition.   VBCI Community Care, Population Health does not replace or interfere with any arrangements made by the Inpatient Transition of Care team.   For questions contact:   Charlesetta Shanks, RN, BSN, CCM [place]  [facility] Liaison Direct Dial: [phone] or secure chat Email: [email or web address]

## 2023-05-14 DIAGNOSIS — I480 Paroxysmal atrial fibrillation: Secondary | ICD-10-CM

## 2023-05-14 DIAGNOSIS — S82891S Other fracture of right lower leg, sequela: Secondary | ICD-10-CM

## 2023-05-14 DIAGNOSIS — N184 Chronic kidney disease, stage 4 (severe): Secondary | ICD-10-CM | POA: Diagnosis not present

## 2023-05-14 DIAGNOSIS — I10 Essential (primary) hypertension: Secondary | ICD-10-CM | POA: Diagnosis not present

## 2023-05-14 LAB — BASIC METABOLIC PANEL WITH GFR
Anion gap: 8 (ref 5–15)
BUN: 28 mg/dL — ABNORMAL HIGH (ref 8–23)
CO2: 24 mmol/L (ref 22–32)
Calcium: 8.3 mg/dL — ABNORMAL LOW (ref 8.9–10.3)
Chloride: 100 mmol/L (ref 98–111)
Creatinine, Ser: 3.42 mg/dL — ABNORMAL HIGH (ref 0.61–1.24)
GFR, Estimated: 19 mL/min — ABNORMAL LOW (ref >60)
Glucose, Bld: 217 mg/dL — ABNORMAL HIGH (ref 70–99)
Potassium: 4.3 mmol/L (ref 3.5–5.1)
Sodium: 132 mmol/L — ABNORMAL LOW (ref 135–145)

## 2023-05-14 LAB — CBC
HCT: 23.9 % — ABNORMAL LOW (ref 39.0–52.0)
Hemoglobin: 7.9 g/dL — ABNORMAL LOW (ref 13.0–17.0)
MCH: 29.9 pg (ref 26.0–34.0)
MCHC: 33.1 g/dL (ref 30.0–36.0)
MCV: 90.5 fL (ref 80.0–100.0)
Platelets: 122 10*3/uL — ABNORMAL LOW (ref 150–400)
RBC: 2.64 MIL/uL — ABNORMAL LOW (ref 4.22–5.81)
RDW: 16.4 % — ABNORMAL HIGH (ref 11.5–15.5)
WBC: 6.8 10*3/uL (ref 4.0–10.5)
nRBC: 0 % (ref 0.0–0.2)

## 2023-05-14 LAB — GLUCOSE, CAPILLARY
Glucose-Capillary: 190 mg/dL — ABNORMAL HIGH (ref 70–99)
Glucose-Capillary: 184 mg/dL — ABNORMAL HIGH (ref 70–99)
Glucose-Capillary: 161 mg/dL — ABNORMAL HIGH (ref 70–99)
Glucose-Capillary: 177 mg/dL — ABNORMAL HIGH (ref 70–99)

## 2023-05-14 LAB — MAGNESIUM: Magnesium: 1.7 mg/dL (ref 1.7–2.4)

## 2023-05-14 MED ORDER — INSULIN ASPART 100 UNIT/ML IJ SOLN
0.0000 [IU] | Freq: Every day | INTRAMUSCULAR | Status: DC
Start: 2023-05-14 — End: 2023-05-21
  Administered 2023-05-15: 2 [IU] via SUBCUTANEOUS

## 2023-05-14 MED ORDER — INSULIN ASPART 100 UNIT/ML IJ SOLN
0.0000 [IU] | Freq: Three times a day (TID) | INTRAMUSCULAR | Status: DC
  Administered 2023-05-14 – 2023-05-18 (×13): 2 [IU] via SUBCUTANEOUS
  Administered 2023-05-18 (×2): 3 [IU] via SUBCUTANEOUS
  Administered 2023-05-19 – 2023-05-21 (×6): 2 [IU] via SUBCUTANEOUS

## 2023-05-14 MED ORDER — INSULIN ASPART 100 UNIT/ML IJ SOLN
2.0000 [IU] | Freq: Three times a day (TID) | INTRAMUSCULAR | Status: DC
  Administered 2023-05-14 – 2023-05-20 (×16): 2 [IU] via SUBCUTANEOUS

## 2023-05-14 MED ORDER — INSULIN GLARGINE-YFGN 100 UNIT/ML ~~LOC~~ SOLN
5.0000 [IU] | Freq: Every day | SUBCUTANEOUS | Status: DC
  Administered 2023-05-14 – 2023-05-17 (×4): 5 [IU] via SUBCUTANEOUS
  Filled 2023-05-14 (×5): qty 0.05

## 2023-05-14 NOTE — Care Management Important Message (Signed)
 Important Message  Patient Details  Name: GREGERY WALBERG MRN: [id number] Date of Birth: [birth year]   Important Message Given:  Yes - Medicare IM     Sherilyn Banker 05/14/2023, 2:32 PM

## 2023-05-14 NOTE — Progress Notes (Deleted)
 Per notes, patient is for OR tomorrow, no order for NPO for patient, called Orthopedic Trauma specialist call center to inquire regrading pre-op orders.

## 2023-05-14 NOTE — Progress Notes (Signed)
 [facility] PROGRESS NOTE    Progress Note  Harold [name]  MVH:[id number] DOB: 10/26/1955 DOA: 05/12/2023 PCP: [name], MD     Brief Narrative:   Harold [name] is an 68 y.o. male past medical history of morbid obesity, obstructive sleep apnea diabetes mellitus type 2 on A1c, CAD with a history of stent, atrial fibrillation on Eliquis, carotid stenosis, chronic kidney disease stage IIIb, diabetic peripheral neuropathy, hypothyroidism recently discharged on 05/10/2018 for comes in for right ankle pain BTK surgery was consulted underwent external fixation of the right ankle with closed reduction, plan to repeat surgery on 05/15/2023  Assessment/Plan:   Open right ankle fracture, sequela: On November 2025 underwent ORIF for ankle fracture with revision in December 2024 for broken hardware. 03/31/2023 underwent hardware removal due to infection. 04/02/2023 underwent I&D by Dr. Lajoyce Corners. Now readmitted on 05/12/2023 for external fixation of the right ankle on the same day. Plan to return to the OR on 05/15/2022 for removal of external device and definite fixation of the right ankle. Continue narcotics, anticoagulation and weightbearing per Ortho.  Acute postoperative blood loss anemia/chronic kidney disease IV: Status post 1 unit packed red blood cells hemoglobin this morning 7.9.  Chronic renal disease, stage IV (HCC) Renal function is stable, his baseline is around 4. He required HD during this dialysis.  Hypomagnesemia: Acute bleeding orally magnesium 1.7. Continue oral supplementation.  Paroxysmal atrial fibrillation: Currently on amiodarone, Eliquis on hold for surgical intervention. Orthopedic surgery to dictate when to restart.  CAD, LAD/CFX DES 04/28/12- staged RCA DES 04/29/12 after pt declined CABG, cath 05/14/13  stable CAD medical therapy: Continue Plavix denies chest pain or shortness of breath.  Struct of sleep apnea: Continue CPAP.  Anxiety and  depression:  continue Cymbalta.  Chronic hypotension: Continue midodrine.  OSA on CPAP Continue CPAP.  Acquired hypothyroidism Continue Synthroid.  Uncontrolled type 2 diabetes mellitus with hyperglycemia, with long-term current use of insulin: Blood glucose significantly elevated. Last A1c of 7. Will start long-acting insulin, continue sliding scale insulin.  Chronic gout: Continue colchicine.  Morbid obesity: Noted  RN Pressure Injury Documentation: Pressure Injury 04/17/23 Ankle Right;Anterior Deep Tissue Pressure Injury - Purple or maroon localized area of discolored intact skin or blood-filled blister due to damage of underlying soft tissue from pressure and/or shear. (Active)  04/17/23 2040  Location: Ankle  Location Orientation: Right;Anterior  Staging: Deep Tissue Pressure Injury - Purple or maroon localized area of discolored intact skin or blood-filled blister due to damage of underlying soft tissue from pressure and/or shear.  Wound Description (Comments):   Present on Admission: Yes  Dressing Type None 05/13/23 0740      DVT prophylaxis: Lovenox Family Communication:none Status is: Inpatient Remains inpatient appropriate because: Open right ankle fracture    Code Status:     Code Status Orders  (From admission, onward)           Start     Ordered   05/12/23 1338  Full code  Continuous       Question:  By:  Answer:  Other   05/12/23 1343           Code Status History     Date Active Date Inactive Code Status Order ID Comments User Context   03/27/2023 2243 05/10/2023 1618 Full Code [id number]  Angie Fava DO ED   01/09/2023 0555 01/14/2023 1612 Full Code [id number]  Dolly Rias, MD ED   01/02/2023 0828 01/07/2023 1852 Full Code [id number]  Katrinka Blazing,  Loreta Ave, MD ED   01/02/2023 0813 01/02/2023 0828 Full Code [id number]  Clydie Braun, MD ED   11/06/2022 1411 11/07/2022 2159 Full Code [id number]  Iran Ouch, MD Inpatient    07/26/2020 1434 07/27/2020 0140 Full Code [id number]  Coletta Memos, MD Inpatient   05/14/2013 1827 05/15/2013 1657 Full Code [id number]  Lennette Bihari, MD Inpatient   05/13/2013 2244 05/14/2013 1827 Full Code [id number]  Barrett, Joline Salt, PA-C Inpatient      Advance Directive Documentation    Flowsheet Row Most Recent Value  Type of Advance Directive Healthcare Power of Attorney, Living will  Pre-existing out of facility DNR order (yellow form or pink MOST form) --  "MOST" Form in Place? --         IV Access:   Peripheral IV   Procedures and diagnostic studies:   DG Ribs Unilateral W/Chest Right Result Date: 05/13/2023 CLINICAL DATA:  Rib pain EXAM: RIGHT RIBS AND CHEST - 3+ VIEW COMPARISON:  Chest x-ray 04/17/2023 FINDINGS: No fracture or other bone lesions are seen involving the ribs. There is no evidence of pneumothorax or pleural effusion. Both lungs are clear. The heart is enlarged. Left-sided central venous catheter tip projects over the distal SVC. IMPRESSION: 1. No evidence of rib fracture. 2. Cardiomegaly. Electronically Signed   By: Darliss Cheney M.D.   On: 05/13/2023 15:56   CT ANKLE RIGHT WO CONTRAST Result Date: 05/13/2023 CLINICAL DATA:  One day postop following irrigation and debridement with external fixation of right trimalleolar ankle fracture EXAM: CT OF THE RIGHT ANKLE WITHOUT CONTRAST TECHNIQUE: Multidetector CT imaging of the right ankle was performed according to the standard protocol. Multiplanar CT image reconstructions were also generated. RADIATION DOSE REDUCTION: This exam was performed according to the departmental dose-optimization program which includes automated exposure control, adjustment of the mA and/or kV according to patient size and/or use of iterative reconstruction technique. COMPARISON:  X-ray 05/12/2023, CT 03/30/2023 FINDINGS: Bones/Joint/Cartilage Status post external fixation of right ankle fracture with hardware spanning the calcaneal body and  distal tibial diaphysis. Mildly displaced fracture of the distal fibular diaphysis with mild displacement. There is bony callus formation along the fracture site. Prior hardware removal of the distal fibula. Mildly distracted fracture through the base of the medial malleolus. Posterior malleolar fracture is posteriorly displaced by 7 mm. Evidence of prior hardware removal in the distal tibia as well as removal of prior subtalar hardware. Progressive tibiotalar joint space loss. Tibiotalar joint effusion with multiple foci of intra-articular air, likely related to recent surgery. Bones are demineralized, likely secondary to disuse. Alignment of the hindfoot and midfoot is maintained. Ligaments Suboptimally assessed by CT. Muscles and Tendons Tibialis posterior tendon is ill-defined. Generalized muscle atrophy. Soft tissues Diffuse soft tissue swelling about the ankle. There is air in the soft tissues at the medial aspect of the ankle, presumably related to prior surgery. Superimposed infection not excluded. IMPRESSION: 1. Status post external fixation of trimalleolar right ankle fracture. 2. Progressive tibiotalar joint space loss. 3. Diffuse soft tissue swelling about the ankle. There is air in the soft tissues at the medial aspect of the ankle, presumably related to prior surgery. Superimposed infection not excluded. 4. Tibialis posterior tendon is ill-defined. Electronically Signed   By: Duanne Guess D.O.   On: 05/13/2023 12:51   DG Ankle Right Port Result Date: 05/12/2023 CLINICAL DATA:  Status post external fixator placement EXAM: PORTABLE RIGHT ANKLE - 2 VIEW COMPARISON:  Intraoperative films from earlier in the  same day. FINDINGS: External fixator is noted in place. Fibular and distal tibial fractures are seen. No new focal abnormality is noted. IMPRESSION: Stable external fixator. Electronically Signed   By: Alcide Clever M.D.   On: 05/12/2023 22:12   DG Tibia/Fibula Right Result Date:  05/12/2023 CLINICAL DATA:  External fixator placement EXAM: RIGHT TIBIA AND FIBULA - 2 VIEW COMPARISON:  Films from earlier in the same day. FLUOROSCOPY TIME:  Radiation Exposure Index (as provided by the fluoroscopic device): 0.57 mGy If the device does not provide the exposure index: Fluoroscopy Time:  14 seconds Number of Acquired Images:  3 FINDINGS: Initial images demonstrate the prior fibular fracture and removed surgical hardware. Medial malleolar fragments are again noted similar to that seen on the prior exam. External fixator is then placed. Posterior malleolar fracture is noted as well. IMPRESSION: Placement of external fixator. Electronically Signed   By: Alcide Clever M.D.   On: 05/12/2023 22:12   DG C-Arm 1-60 Min-No Report Result Date: 05/12/2023 Fluoroscopy was utilized by the requesting physician.  No radiographic interpretation.   DG Ankle Complete Right Result Date: 05/12/2023 CLINICAL DATA:  sp fall, open fracture; sp fall with open fracture. EXAM: RIGHT ANKLE - COMPLETE 3+ VIEW; RIGHT TIBIA AND FIBULA - 2 VIEW COMPARISON:  None Available. FINDINGS: There is comminuted and displaced fracture of the lower diaphysis of the fibula. There are ghost tracks in the fibula from previously removed hardware. There is faint ossific density surrounding the fracture site which may represent underlying heterotopic ossification. There are also age indeterminate fractures of the medial malleolus and posterior malleolus. No other acute fracture or dislocation. No aggressive osseous lesion. Ankle mortise appears intact. Mild-to-moderate diffuse soft tissue swelling overlying the medial malleolus. Vascular stent noted along the lower third femur. No radiopaque foreign bodies. IMPRESSION: *Comminuted and displaced fracture of the lower diaphysis of the fibula. *Age indeterminate fractures of the medial and posterior malleoli. Electronically Signed   By: Jules Schick M.D.   On: 05/12/2023 12:01   DG  Tibia/Fibula Right Result Date: 05/12/2023 CLINICAL DATA:  sp fall, open fracture; sp fall with open fracture. EXAM: RIGHT ANKLE - COMPLETE 3+ VIEW; RIGHT TIBIA AND FIBULA - 2 VIEW COMPARISON:  None Available. FINDINGS: There is comminuted and displaced fracture of the lower diaphysis of the fibula. There are ghost tracks in the fibula from previously removed hardware. There is faint ossific density surrounding the fracture site which may represent underlying heterotopic ossification. There are also age indeterminate fractures of the medial malleolus and posterior malleolus. No other acute fracture or dislocation. No aggressive osseous lesion. Ankle mortise appears intact. Mild-to-moderate diffuse soft tissue swelling overlying the medial malleolus. Vascular stent noted along the lower third femur. No radiopaque foreign bodies. IMPRESSION: *Comminuted and displaced fracture of the lower diaphysis of the fibula. *Age indeterminate fractures of the medial and posterior malleoli. Electronically Signed   By: Jules Schick M.D.   On: 05/12/2023 12:01     Medical Consultants:   None.   Subjective:    Yahir Tavano Benton-Elliot pain is controlled, not having a bowel movement.  Objective:    Vitals:   05/13/23 2013 05/13/23 2142 05/14/23 0420 05/14/23 0428  BP: (!) 131/51  (!) 148/61   Pulse: 69  62   Resp: 16 20 16    Temp: 100.1 F (37.8 C)  97.8 F (36.6 C)   TempSrc:      SpO2: 99% 99% 99%   Weight:    135.2  kg  Height:       SpO2: 99 % O2 Flow Rate (L/min): 2 L/min   Intake/Output Summary (Last 24 hours) at 05/14/2023 0742 Last data filed at 05/14/2023 0429 Gross per 24 hour  Intake 700 ml  Output 650 ml  Net 50 ml   Filed Weights   05/12/23 0940 05/14/23 0428  Weight: 136.1 kg 135.2 kg    Exam: General exam: In no acute distress. Respiratory system: Good air movement and clear to auscultation. Cardiovascular system: S1 & S2 heard, RRR. No JVD. Gastrointestinal system: Abdomen  is nondistended, soft and nontender.  Extremities: External fixation device right leg Skin: No rashes, lesions or ulcers Psychiatry: Judgement and insight appear normal. Mood & affect appropriate.    Data Reviewed:    Labs: Basic Metabolic Panel: Recent Labs  Lab 05/08/23 0511 05/09/23 0414 05/10/23 0358 05/12/23 0947 05/13/23 0648 05/14/23 0627  NA 135 138 140 138 135 132*  K 4.0 3.9 4.6 3.8 4.4 4.3  CL 101 105 102 101 101 100  CO2 26 27 25 25 24 24   GLUCOSE 193* 102* 99 229* 252* 217*  BUN 28* 30* 30* 29* 30* 28*  CREATININE 4.08* 4.23* 4.07* 3.66* 3.68* 3.42*  CALCIUM 8.2* 8.2* 8.8* 8.7* 8.2* 8.3*  MG  --   --   --   --  1.4* 1.7  PHOS 4.1 4.6 4.6  --  4.0  --    GFR Estimated Creatinine Clearance: 29.4 mL/min (A) (by C-G formula based on SCr of 3.42 mg/dL (H)). Liver Function Tests: Recent Labs  Lab 05/08/23 0511 05/09/23 0414 05/10/23 0358 05/13/23 0648  AST  --   --   --  11*  ALT  --   --   --  9  ALKPHOS  --   --   --  73  BILITOT  --   --   --  0.6  PROT  --   --   --  5.7*  ALBUMIN 2.6* 2.6* 2.6* 2.6*   No results for input(s): "LIPASE", "AMYLASE" in the last 168 hours. No results for input(s): "AMMONIA" in the last 168 hours. Coagulation profile Recent Labs  Lab 05/12/23 0947  INR 1.5*   COVID-19 Labs  No results for input(s): "DDIMER", "FERRITIN", "LDH", "CRP" in the last 72 hours.  Lab Results  Component Value Date   SARSCOV2NAA NEGATIVE 03/27/2023   SARSCOV2NAA NEGATIVE 07/24/2020   SARSCOV2NAA NEGATIVE 10/09/2019    CBC: Recent Labs  Lab 05/12/23 0947 05/13/23 0648 05/14/23 0627  WBC 6.9 7.1 6.8  HGB 9.0* 7.0* 7.9*  HCT 27.5* 21.7* 23.9*  MCV 92.3 91.9 90.5  PLT 162 135* 122*   Cardiac Enzymes: No results for input(s): "CKTOTAL", "CKMB", "CKMBINDEX", "TROPONINI" in the last 168 hours. BNP (last 3 results) No results for input(s): "PROBNP" in the last 8760 hours. CBG: Recent Labs  Lab 05/12/23 1857 05/13/23 1121  05/13/23 1622 05/13/23 2125 05/14/23 0603  GLUCAP 215* 213* 204* 180* 190*   D-Dimer: No results for input(s): "DDIMER" in the last 72 hours. Hgb A1c: No results for input(s): "HGBA1C" in the last 72 hours. Lipid Profile: No results for input(s): "CHOL", "HDL", "LDLCALC", "TRIG", "CHOLHDL", "LDLDIRECT" in the last 72 hours. Thyroid function studies: No results for input(s): "TSH", "T4TOTAL", "T3FREE", "THYROIDAB" in the last 72 hours.  Invalid input(s): "FREET3" Anemia work up: No results for input(s): "VITAMINB12", "FOLATE", "FERRITIN", "TIBC", "IRON", "RETICCTPCT" in the last 72 hours. Sepsis Labs: Recent Labs  Lab 05/12/23  7829 05/13/23 0648 05/14/23 0627  WBC 6.9 7.1 6.8   Microbiology Recent Results (from the past 240 hours)  Surgical pcr screen     Status: None   Collection Time: 05/12/23  5:04 PM   Specimen: Nasal Mucosa; Nasal Swab  Result Value Ref Range Status   MRSA, PCR NEGATIVE NEGATIVE Final   Staphylococcus aureus NEGATIVE NEGATIVE Final    Comment: (NOTE) The Xpert SA Assay (FDA approved for NASAL specimens in patients 59 years of age and older), is one component of a comprehensive surveillance program. It is not intended to diagnose infection nor to guide or monitor treatment. Performed at [facility], [address], [place], [place] [address]      Medications:    allopurinol  50 mg Oral Daily   amiodarone  200 mg Oral BID   atorvastatin  40 mg Oral Daily   B-complex with vitamin C  1 tablet Oral Daily   Chlorhexidine Gluconate Cloth  6 each Topical Daily   [START ON 05/15/2023] colchicine  0.6 mg Oral QODAY   [START ON 05/15/2023] vitamin B-12  1,000 mcg Oral Daily   docusate sodium  100 mg Oral BID   DULoxetine  60 mg Oral Daily   enoxaparin (LOVENOX) injection  30 mg Subcutaneous Q24H   ezetimibe  10 mg Oral Daily   feeding supplement  237 mL Oral BID BM   ferrous sulfate  325 mg Oral Q breakfast   gabapentin  300 mg Oral QHS    insulin aspart  0-15 Units Subcutaneous TID WC   insulin aspart  0-5 Units Subcutaneous QHS   levothyroxine  88 mcg Oral Q0600   midodrine  5 mg Oral TID WC   sodium chloride flush  10-40 mL Intracatheter Q12H   sodium chloride flush  3 mL Intravenous Q12H   sodium chloride flush  3-10 mL Intravenous Q12H   sodium chloride flush  3-10 mL Intravenous Q12H   Vitamin D (Ergocalciferol)  50,000 Units Oral Q7 days   Continuous Infusions:    LOS: 2 days   Marinda Elk  [facility]  05/14/2023, 7:42 AM

## 2023-05-14 NOTE — Plan of Care (Signed)

## 2023-05-14 NOTE — TOC Initial Note (Signed)
 Transition of Care [facility]) - Initial/Assessment Note    Patient Details  Name: Harold [name] MRN: [id number] Date of Birth: April 07, 1955  [facility] CM/SW Contact:    Lorri Frederick, LCSW Phone Number: 05/14/2023, 3:13 PM  Clinical Narrative:    CSW spoke with pt regarding PT recommendation for SNF.  Pt with recent DC home, was at [facility] 12/24.  Unclear if pt will be in copay days or note: pt reports he left [facility] 12/10 and was next admitted to [facility] on 2/9.  Pt reporting he cannot afford SNF if he is in copay days.  Permission given to speak with husband [name].  [facility] currently in place.          Expected Discharge Plan: Skilled Nursing Facility Barriers to Discharge: Continued Medical Work up   Patient Goals and CMS Choice Patient states their goals for this hospitalization and ongoing recovery are:: get all this behind me          Expected Discharge Plan and Services In-house Referral: Clinical Social Work     Living arrangements for the past 2 months: Single Family Home                                      Prior Living Arrangements/Services Living arrangements for the past 2 months: Single Family Home Lives with:: Spouse Patient language and need for interpreter reviewed:: Yes Do you feel safe going back to the place where you live?: Yes      Need for Family Participation in Patient Care: Yes (Comment) Care giver support system in place?: Yes (comment) Current home services: Home PT, Home OT ([facility]) Criminal Activity/Legal Involvement Pertinent to Current Situation/Hospitalization: No - Comment as needed  Activities of Daily Living   ADL Screening (condition at time of admission) Independently performs ADLs?: No Does the patient have a NEW difficulty with bathing/dressing/toileting/self-feeding that is expected to last >3 days?: Yes (Initiates electronic notice to provider for possible OT consult) Does the  patient have a NEW difficulty with getting in/out of bed, walking, or climbing stairs that is expected to last >3 days?: Yes (Initiates electronic notice to provider for possible PT consult) Does the patient have a NEW difficulty with communication that is expected to last >3 days?: No Is the patient deaf or have difficulty hearing?: No Does the patient have difficulty seeing, even when wearing glasses/contacts?: No Does the patient have difficulty concentrating, remembering, or making decisions?: No  Permission Sought/Granted Permission sought to share information with : Family Supports Permission granted to share information with : Yes, Verbal Permission Granted  Share Information with NAME: husband [name]           Emotional Assessment Appearance:: Appears stated age Attitude/Demeanor/Rapport: Engaged Affect (typically observed): Appropriate, Pleasant Orientation: : Oriented to Self, Oriented to Place, Oriented to  Time, Oriented to Situation      Admission diagnosis:  Acute right ankle pain [M25.571] Displaced physeal fracture of distal end of right fibula, sequela [S89.301S] Patient Active Problem List   Diagnosis Date Noted   Open right ankle fracture, sequela 05/12/2023   Obesity, Class II, BMI 35-39.9 04/23/2023   Osteomyelitis (HCC) 04/17/2023   Cellulitis of right lower extremity 03/28/2023   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, right (HCC) 03/28/2023   Abscess of skin of right ankle 03/28/2023   PAD (peripheral artery disease) (HCC)  03/28/2023   Vitamin D deficiency 01/07/2023   Prolonged QT interval 01/02/2023   Acute kidney injury superimposed on stage 4 chronic kidney disease (HCC) 01/02/2023   Normocytic anemia 01/02/2023   Paroxysmal atrial fibrillation (HCC) 01/02/2023   Uncontrolled type 2 diabetes mellitus with hyperglycemia, with long-term current use of insulin (HCC) 01/02/2023   Acquired hypothyroidism 01/02/2023   CKD stage 3b, GFR 30-44 ml/min  (HCC) 11/07/2022   CAD in native artery 11/07/2022   PVD (peripheral vascular disease) (HCC) 11/06/2022   Hypercoagulable state due to persistent atrial fibrillation (HCC) 02/19/2022   Persistent atrial fibrillation (HCC) 01/28/2022   Lumbar stenosis with neurogenic claudication 07/26/2020   Diabetic neuropathy (HCC) 08/24/2019   Trigger thumb of left hand 09/02/2018   Atherosclerosis of native artery of both lower extremities with intermittent claudication (HCC) 05/12/2018   Pain of left hand 03/26/2018   Trigger finger 03/03/2017   Pain in finger of left hand 03/03/2017   Snoring 08/20/2016   Morbid (severe) obesity due to excess calories (HCC) 04/18/2016   Venous stasis of both lower extremities - with edema 02/25/2015   Right-sided chest wall pain 05/08/2014   Gastroesophageal reflux disease without esophagitis 10/10/2013   Chronic heart failure with preserved ejection fraction (HFpEF) (HCC) 06/27/2013   COPD (chronic obstructive pulmonary disease) (HCC) 06/17/2013   Restrictive lung disease 06/17/2013   Obesity, Class III, BMI 40-49.9 (morbid obesity) (HCC) 09/01/2012   Generalized weakness 09/01/2012   Chronic renal disease, stage IV (HCC) 04/30/2012   OSA on CPAP 04/29/2012   Pulmonary hypertension, 04/29/2012   Dyspnea on exertion   04/26/2012   CAD, LAD/CFX DES 04/28/12- staged RCA DES 04/29/12 after pt declined CABG, cath 05/14/13 stable CAD medical therapy    Carotid artery stenosis without cerebral infarction, bilateral 11/07/2011   Diabetes mellitus type 2 with neurological manifestations (HCC) 04/29/2011   Essential hypertension 04/29/2011   Neuropathy 04/29/2011   Hyperlipidemia associated with type 2 diabetes mellitus (HCC) 04/29/2011   Anxiety and depression 04/29/2011   PCP:  [name], MD Pharmacy:   [facility] [phone] - [place], [place] - [address] [place] [address] Phone: [phone] Fax: [phone]     Social  Drivers of Health (SDOH) Social History: SDOH Screenings   Food Insecurity: No Food Insecurity (05/12/2023)  Housing: Low Risk  (05/12/2023)  Transportation Needs: No Transportation Needs (05/12/2023)  Utilities: Not At Risk (05/12/2023)  Social Connections: Moderately Integrated (05/12/2023)  Tobacco Use: Medium Risk (05/12/2023)   SDOH Interventions:     Readmission Risk Interventions     No data to display

## 2023-05-14 NOTE — Progress Notes (Signed)
 Orthopaedic Trauma Progress Note  SUBJECTIVE: Doing okay this morning.  Not having much pain in the right ankle. Denies any numbness or tingling throughout the right lower extremity.  Tolerating the Ex-Fix.  Still having some pain over the right ribs, imaging was negative for rib fractures.  No SOB. No nausea/vomiting. No other complaints.    I have discussed definitive fixation of right ankle fracture with the patient today at bedside.  Will plan to proceed with a hindfoot fusion nail tomorrow.  OBJECTIVE:  Vitals:   05/13/23 2142 05/14/23 0420  BP:  (!) 148/61  Pulse:  62  Resp: 20 16  Temp:  97.8 F (36.6 C)  SpO2: 99% 99%    General: Sitting in bed, no acute distress.  Pleasant and cooperative Respiratory: No increased work of breathing.  Right lower extremity: Ex-Fix in place.  Dressing clean, dry, intact.  Decreased sensation to light touch about the foot, has neuropathy at baseline.  Endorses sensation to the proximal tibia, knee, thigh.  Able to wiggle toes. + DP pulse  IMAGING: Stable post op imaging.   LABS:  Results for orders placed or performed during the hospital encounter of 05/12/23 (from the past 24 hours)  Prepare RBC (crossmatch)     Status: None   Collection Time: 05/13/23  9:28 AM  Result Value Ref Range   Order Confirmation      ORDER PROCESSED BY BLOOD BANK Performed at [id number], [address] [place], [place] [address]   Type and screen [facility]     Status: None   Collection Time: 05/13/23  9:28 AM  Result Value Ref Range   ABO/RH(D) A POS    Antibody Screen NEG    Sample Expiration 05/16/2023,2359    Unit Number [id number]    Blood Component Type RBC LR PHER2    Unit division 00    Status of Unit ISSUED,FINAL    Transfusion Status OK TO TRANSFUSE    Crossmatch Result      Compatible Performed at [facility], [address], [place], [place] [address]   Glucose, capillary     Status: Abnormal    Collection Time: 05/13/23 11:21 AM  Result Value Ref Range   Glucose-Capillary 213 (H) 70 - 99 mg/dL  Glucose, capillary     Status: Abnormal   Collection Time: 05/13/23  4:22 PM  Result Value Ref Range   Glucose-Capillary 204 (H) 70 - 99 mg/dL  Glucose, capillary     Status: Abnormal   Collection Time: 05/13/23  9:25 PM  Result Value Ref Range   Glucose-Capillary 180 (H) 70 - 99 mg/dL  Glucose, capillary     Status: Abnormal   Collection Time: 05/14/23  6:03 AM  Result Value Ref Range   Glucose-Capillary 190 (H) 70 - 99 mg/dL  CBC     Status: Abnormal   Collection Time: 05/14/23  6:27 AM  Result Value Ref Range   WBC 6.8 4.0 - 10.5 K/uL   RBC 2.64 (L) 4.22 - 5.81 MIL/uL   Hemoglobin 7.9 (L) 13.0 - 17.0 g/dL   HCT 62.9 (L) 52.8 - 41.3 %   MCV 90.5 80.0 - 100.0 fL   MCH 29.9 26.0 - 34.0 pg   MCHC 33.1 30.0 - 36.0 g/dL   RDW 24.4 (H) 01.0 - 27.2 %   Platelets 122 (L) 150 - 400 K/uL   nRBC 0.0 0.0 - 0.2 %  Basic metabolic panel  Status: Abnormal   Collection Time: 05/14/23  6:27 AM  Result Value Ref Range   Sodium 132 (L) 135 - 145 mmol/L   Potassium 4.3 3.5 - 5.1 mmol/L   Chloride 100 98 - 111 mmol/L   CO2 24 22 - 32 mmol/L   Glucose, Bld 217 (H) 70 - 99 mg/dL   BUN 28 (H) 8 - 23 mg/dL   Creatinine, Ser 1.61 (H) 0.61 - 1.24 mg/dL   Calcium 8.3 (L) 8.9 - 10.3 mg/dL   GFR, Estimated 19 (L) >60 mL/min   Anion gap 8 5 - 15  Magnesium     Status: None   Collection Time: 05/14/23  6:27 AM  Result Value Ref Range   Magnesium 1.7 1.7 - 2.4 mg/dL   *Note: Due to a large number of results and/or encounters for the requested time period, some results have not been displayed. A complete set of results can be found in Results Review.    ASSESSMENT: Harold [name] is a 68 y.o. male, 2 Days Post-Op s/p IRRIGATION AND DEBRIDEMENT WITH EXTERNAL FIXATION RIGHT TRIMALLEOLAR ANKLE FRACTURE  CV/Blood loss: Acute blood loss anemia, Hgb 7.9 this morning.  Received 1 unit PRBCs per  primary team on 05/13/2023.  Hemodynamically stable  PLAN: Weightbearing: NWB RLE ROM: Maintain Ex-Fix.  Okay for knee motion as tolerated Incisional and dressing care: Reinforce dressings as needed  Showering: Bed bath Orthopedic device(s): Ex-Fix RLE Pain management:  1. Tylenol 650 mg q 6 hours PRN 2. Robaxin 500 mg q 6 hours PRN 3. Oxycodone 5 mg q 4 hours PRN 4. Dilaudid 0.5-1 mg q 4 hours PRN 5.  Neurontin 300 mg nightly VTE prophylaxis: Lovenox, SCDs ID:  Ancef 2gm post op Foley/Lines:  No foley, KVO IVFs Impediments to Fracture Healing: Vitamin D level 28 when checked February 2025, continue supplementation Dispo: PT/OT evaluation ongoing.  Plan for return to the OR with Dr. Jena Gauss on Thursday (05/15/2023) for removal of external fixation and definitive fixation right ankle.  Patient agrees to procedure.  Consent will be obtained.  Follow - up plan: 2 weeks after discharge for wound check and repeat x-rays   Contact information:  Harold Merle MD, Thyra Breed PA-C. After hours and holidays please check [email or web address] for group call information for [facility]   Thompson Caul, PA-C [phone] (office) [email or web address]

## 2023-05-14 NOTE — Plan of Care (Signed)
  Problem: Clinical Measurements: Goal: Will remain free from infection Outcome: Not Progressing   Problem: Clinical Measurements: Goal: Diagnostic test results will improve Outcome: Not Progressing   Problem: Pain Managment: Goal: General experience of comfort will improve and/or be controlled Outcome: Not Progressing   Problem: Skin Integrity: Goal: Risk for impaired skin integrity will decrease Outcome: Not Progressing   Problem: Metabolic: Goal: Ability to maintain appropriate glucose levels will improve Outcome: Not Progressing

## 2023-05-15 ENCOUNTER — Inpatient Hospital Stay (HOSPITAL_COMMUNITY): Admitting: Anesthesiology

## 2023-05-15 ENCOUNTER — Inpatient Hospital Stay (HOSPITAL_COMMUNITY)

## 2023-05-15 ENCOUNTER — Other Ambulatory Visit: Payer: Self-pay

## 2023-05-15 ENCOUNTER — Encounter (HOSPITAL_COMMUNITY)
Admission: EM | Disposition: A | Discharge status: Home / Self Care | Payer: Self-pay | Source: Home / Self Care | Attending: Internal Medicine

## 2023-05-15 ENCOUNTER — Encounter (HOSPITAL_COMMUNITY): Payer: Self-pay | Admitting: Internal Medicine

## 2023-05-15 DIAGNOSIS — S82851B Displaced trimalleolar fracture of right lower leg, initial encounter for open fracture type I or II: Secondary | ICD-10-CM

## 2023-05-15 DIAGNOSIS — I10 Essential (primary) hypertension: Secondary | ICD-10-CM | POA: Diagnosis not present

## 2023-05-15 DIAGNOSIS — Z87891 Personal history of nicotine dependence: Secondary | ICD-10-CM

## 2023-05-15 DIAGNOSIS — S82891S Other fracture of right lower leg, sequela: Secondary | ICD-10-CM | POA: Diagnosis not present

## 2023-05-15 DIAGNOSIS — J449 Chronic obstructive pulmonary disease, unspecified: Secondary | ICD-10-CM | POA: Diagnosis not present

## 2023-05-15 HISTORY — PX: ANKLE FUSION: SHX881

## 2023-05-15 LAB — CBC
HCT: 23 % — ABNORMAL LOW (ref 39.0–52.0)
Hemoglobin: 7.6 g/dL — ABNORMAL LOW (ref 13.0–17.0)
MCH: 29.5 pg (ref 26.0–34.0)
MCHC: 33 g/dL (ref 30.0–36.0)
MCV: 89.1 fL (ref 80.0–100.0)
Platelets: 141 10*3/uL — ABNORMAL LOW (ref 150–400)
RBC: 2.58 MIL/uL — ABNORMAL LOW (ref 4.22–5.81)
RDW: 15.9 % — ABNORMAL HIGH (ref 11.5–15.5)
WBC: 5.1 10*3/uL (ref 4.0–10.5)
nRBC: 0 % (ref 0.0–0.2)

## 2023-05-15 LAB — GLUCOSE, CAPILLARY
Glucose-Capillary: 175 mg/dL — ABNORMAL HIGH (ref 70–99)
Glucose-Capillary: 171 mg/dL — ABNORMAL HIGH (ref 70–99)
Glucose-Capillary: 200 mg/dL — ABNORMAL HIGH (ref 70–99)
Glucose-Capillary: 191 mg/dL — ABNORMAL HIGH (ref 70–99)
Glucose-Capillary: 175 mg/dL — ABNORMAL HIGH (ref 70–99)
Glucose-Capillary: 248 mg/dL — ABNORMAL HIGH (ref 70–99)
Glucose-Capillary: 211 mg/dL — ABNORMAL HIGH (ref 70–99)

## 2023-05-15 LAB — MAGNESIUM: Magnesium: 1.8 mg/dL (ref 1.7–2.4)

## 2023-05-15 LAB — BLOOD GAS, ARTERIAL
Acid-base deficit: 2.1 mmol/L — ABNORMAL HIGH (ref 0.0–2.0)
Bicarbonate: 21.1 mmol/L (ref 20.0–28.0)
O2 Saturation: 99.2 %
Patient temperature: 37
pCO2 arterial: 31 mmHg — ABNORMAL LOW (ref 32–48)
pH, Arterial: 7.44 (ref 7.35–7.45)
pO2, Arterial: 104 mmHg (ref 83–108)

## 2023-05-15 LAB — BASIC METABOLIC PANEL WITH GFR
Anion gap: 11 (ref 5–15)
BUN: 28 mg/dL — ABNORMAL HIGH (ref 8–23)
CO2: 24 mmol/L (ref 22–32)
Calcium: 8.5 mg/dL — ABNORMAL LOW (ref 8.9–10.3)
Chloride: 97 mmol/L — ABNORMAL LOW (ref 98–111)
Creatinine, Ser: 3.07 mg/dL — ABNORMAL HIGH (ref 0.61–1.24)
GFR, Estimated: 21 mL/min — ABNORMAL LOW (ref >60)
Glucose, Bld: 192 mg/dL — ABNORMAL HIGH (ref 70–99)
Potassium: 3.7 mmol/L (ref 3.5–5.1)
Sodium: 132 mmol/L — ABNORMAL LOW (ref 135–145)

## 2023-05-15 LAB — PREPARE RBC (CROSSMATCH)

## 2023-05-15 SURGERY — ARTHRODESIS ANKLE
Anesthesia: Regional | Site: Ankle | Laterality: Right

## 2023-05-15 MED ORDER — HYDROMORPHONE HCL 1 MG/ML IJ SOLN: 0.2500 mg | INTRAMUSCULAR | Status: DC | PRN

## 2023-05-15 MED ORDER — METOCLOPRAMIDE HCL 5 MG PO TABS
5.0000 mg | ORAL_TABLET | Freq: Three times a day (TID) | ORAL | Status: DC | PRN
  Filled 2023-05-15: qty 2

## 2023-05-15 MED ORDER — PROPOFOL 10 MG/ML IV BOLUS
INTRAVENOUS | Status: AC
  Filled 2023-05-15: qty 20

## 2023-05-15 MED ORDER — MIDAZOLAM HCL 2 MG/2ML IJ SOLN
INTRAMUSCULAR | Status: AC
  Administered 2023-05-15: 1 mg via INTRAVENOUS
  Filled 2023-05-15: qty 2

## 2023-05-15 MED ORDER — BUPIVACAINE LIPOSOME 1.3 % IJ SUSP
INTRAMUSCULAR | Status: DC | PRN
  Administered 2023-05-15: 10 mL via PERINEURAL

## 2023-05-15 MED ORDER — FENTANYL CITRATE (PF) 100 MCG/2ML IJ SOLN
INTRAMUSCULAR | Status: AC
  Administered 2023-05-15: 50 ug via INTRAVENOUS
  Filled 2023-05-15: qty 2

## 2023-05-15 MED ORDER — CEFAZOLIN SODIUM-DEXTROSE 2-4 GM/100ML-% IV SOLN
INTRAVENOUS | Status: AC
  Filled 2023-05-15: qty 100

## 2023-05-15 MED ORDER — PHENYLEPHRINE HCL-NACL 20-0.9 MG/250ML-% IV SOLN
INTRAVENOUS | Status: DC | PRN
  Administered 2023-05-15: 40 ug/min via INTRAVENOUS

## 2023-05-15 MED ORDER — ONDANSETRON HCL 4 MG/2ML IJ SOLN: 4.0000 mg | Freq: Once | INTRAMUSCULAR | Status: DC | PRN

## 2023-05-15 MED ORDER — PHENYLEPHRINE HCL (PRESSORS) 10 MG/ML IV SOLN
INTRAVENOUS | Status: DC | PRN
  Administered 2023-05-15: 160 ug via INTRAVENOUS

## 2023-05-15 MED ORDER — PROPOFOL 10 MG/ML IV BOLUS
INTRAVENOUS | Status: DC | PRN
  Administered 2023-05-15: 200 mg via INTRAVENOUS

## 2023-05-15 MED ORDER — ACETAMINOPHEN 10 MG/ML IV SOLN: 1000.0000 mg | Freq: Four times a day (QID) | INTRAVENOUS | Status: DC

## 2023-05-15 MED ORDER — LIDOCAINE 2% (20 MG/ML) 5 ML SYRINGE
INTRAMUSCULAR | Status: DC | PRN
  Administered 2023-05-15: 40 mg via INTRAVENOUS

## 2023-05-15 MED ORDER — SODIUM CHLORIDE 0.9% IV SOLUTION: Freq: Once | INTRAVENOUS | Status: DC

## 2023-05-15 MED ORDER — CHLORHEXIDINE GLUCONATE 0.12 % MT SOLN: 15.0000 mL | Freq: Once | OROMUCOSAL | Status: AC

## 2023-05-15 MED ORDER — ONDANSETRON HCL 4 MG/2ML IJ SOLN
INTRAMUSCULAR | Status: DC | PRN
  Administered 2023-05-15: 4 mg via INTRAVENOUS

## 2023-05-15 MED ORDER — MIDAZOLAM HCL 2 MG/2ML IJ SOLN: 1.0000 mg | Freq: Once | INTRAMUSCULAR | Status: AC

## 2023-05-15 MED ORDER — FENTANYL CITRATE (PF) 250 MCG/5ML IJ SOLN
INTRAMUSCULAR | Status: AC
Start: 2023-05-15 — End: ?
  Filled 2023-05-15: qty 5

## 2023-05-15 MED ORDER — PHENYLEPHRINE 80 MCG/ML (10ML) SYRINGE FOR IV PUSH (FOR BLOOD PRESSURE SUPPORT)
PREFILLED_SYRINGE | INTRAVENOUS | Status: DC | PRN
  Administered 2023-05-15: 80 ug via INTRAVENOUS

## 2023-05-15 MED ORDER — OXYCODONE HCL 5 MG/5ML PO SOLN: 5.0000 mg | Freq: Once | ORAL | Status: DC | PRN

## 2023-05-15 MED ORDER — CEFAZOLIN SODIUM-DEXTROSE 2-4 GM/100ML-% IV SOLN
2.0000 g | Freq: Once | INTRAVENOUS | Status: AC
  Administered 2023-05-15: 2 g via INTRAVENOUS

## 2023-05-15 MED ORDER — FENTANYL CITRATE (PF) 100 MCG/2ML IJ SOLN: 50.0000 ug | Freq: Once | INTRAMUSCULAR | Status: AC

## 2023-05-15 MED ORDER — ENOXAPARIN SODIUM 40 MG/0.4ML IJ SOSY
40.0000 mg | PREFILLED_SYRINGE | INTRAMUSCULAR | Status: DC
  Administered 2023-05-16: 40 mg via SUBCUTANEOUS
  Filled 2023-05-15 (×2): qty 0.4

## 2023-05-15 MED ORDER — VANCOMYCIN HCL 1000 MG IV SOLR: INTRAVENOUS | Status: DC | PRN

## 2023-05-15 MED ORDER — SODIUM CHLORIDE 0.9 % IV SOLN: INTRAVENOUS | Status: DC

## 2023-05-15 MED ORDER — 0.9 % SODIUM CHLORIDE (POUR BTL) OPTIME
TOPICAL | Status: DC | PRN
  Administered 2023-05-15: 1000 mL

## 2023-05-15 MED ORDER — ROPIVACAINE HCL 5 MG/ML IJ SOLN
INTRAMUSCULAR | Status: DC | PRN
  Administered 2023-05-15: 30 mL via PERINEURAL

## 2023-05-15 MED ORDER — OXYCODONE HCL 5 MG PO TABS: 5.0000 mg | ORAL_TABLET | Freq: Once | ORAL | Status: DC | PRN

## 2023-05-15 MED ORDER — PHENYLEPHRINE HCL-NACL 20-0.9 MG/250ML-% IV SOLN: INTRAVENOUS | Status: DC | PRN

## 2023-05-15 MED ORDER — LIDOCAINE 2% (20 MG/ML) 5 ML SYRINGE
INTRAMUSCULAR | Status: AC
  Filled 2023-05-15: qty 5

## 2023-05-15 MED ORDER — METOCLOPRAMIDE HCL 5 MG/ML IJ SOLN: 5.0000 mg | Freq: Three times a day (TID) | INTRAMUSCULAR | Status: DC | PRN

## 2023-05-15 MED ORDER — CHLORHEXIDINE GLUCONATE 0.12 % MT SOLN
OROMUCOSAL | Status: AC
Start: 2023-05-15 — End: 2023-05-15
  Administered 2023-05-15: 15 mL via OROMUCOSAL
  Filled 2023-05-15: qty 15

## 2023-05-15 MED ORDER — VANCOMYCIN HCL 1000 MG IV SOLR
INTRAVENOUS | Status: AC
  Filled 2023-05-15: qty 20

## 2023-05-15 MED ORDER — ORAL CARE MOUTH RINSE: 15.0000 mL | Freq: Once | OROMUCOSAL | Status: AC

## 2023-05-15 MED ORDER — OXYCODONE HCL 5 MG PO TABS
5.0000 mg | ORAL_TABLET | ORAL | Status: DC | PRN
  Administered 2023-05-15 (×2): 10 mg via ORAL
  Administered 2023-05-16: 5 mg via ORAL
  Administered 2023-05-16 – 2023-05-17 (×3): 10 mg via ORAL
  Administered 2023-05-19: 5 mg via ORAL
  Administered 2023-05-19 (×2): 10 mg via ORAL
  Administered 2023-05-20: 5 mg via ORAL
  Filled 2023-05-15 (×3): qty 2
  Filled 2023-05-15: qty 1
  Filled 2023-05-15 (×3): qty 2
  Filled 2023-05-15: qty 1
  Filled 2023-05-15: qty 2
  Filled 2023-05-15: qty 1

## 2023-05-15 MED ORDER — CEFAZOLIN SODIUM-DEXTROSE 2-4 GM/100ML-% IV SOLN
2.0000 g | Freq: Three times a day (TID) | INTRAVENOUS | Status: AC
  Administered 2023-05-15 – 2023-05-16 (×3): 2 g via INTRAVENOUS
  Filled 2023-05-15 (×3): qty 100

## 2023-05-15 MED ORDER — PHENYLEPHRINE 80 MCG/ML (10ML) SYRINGE FOR IV PUSH (FOR BLOOD PRESSURE SUPPORT): PREFILLED_SYRINGE | INTRAVENOUS | Status: DC | PRN

## 2023-05-15 MED ORDER — ONDANSETRON HCL 4 MG/2ML IJ SOLN
INTRAMUSCULAR | Status: AC
  Filled 2023-05-15: qty 2

## 2023-05-15 SURGICAL SUPPLY — 58 items
BAG COUNTER SPONGE SURGICOUNT (BAG) ×1 IMPLANT
BANDAGE ESMARK 6X9 LF (GAUZE/BANDAGES/DRESSINGS) IMPLANT
BIT DRILL LONG 4.0 (BIT) IMPLANT
BIT DRILL SHORT 4.0 (BIT) IMPLANT
BLADE SAW SGTL HD 18.5X60.5X1. (BLADE) IMPLANT
BLADE SURG 10 STRL SS (BLADE) ×1 IMPLANT
BNDG ELASTIC 4INX 5YD STR LF (GAUZE/BANDAGES/DRESSINGS) IMPLANT
BNDG ESMARK 6X9 LF (GAUZE/BANDAGES/DRESSINGS) IMPLANT
CANISTER SUCT 3000ML PPV (MISCELLANEOUS) ×1 IMPLANT
CHLORAPREP W/TINT 26 (MISCELLANEOUS) ×1 IMPLANT
COVER SURGICAL LIGHT HANDLE (MISCELLANEOUS) ×1 IMPLANT
CUFF TOURN SGL QUICK 42 (TOURNIQUET CUFF) IMPLANT
CUFF TRNQT CYL 34X4.125X (TOURNIQUET CUFF) ×1 IMPLANT
DRAPE C-ARM 42X72 X-RAY (DRAPES) ×1 IMPLANT
DRAPE C-ARMOR (DRAPES) ×1 IMPLANT
DRAPE U-SHAPE 47X51 STRL (DRAPES) ×1 IMPLANT
DRILL BIT LONG 4.0 (BIT) ×2 IMPLANT
DRSG MEPITEL 4X7.2 (GAUZE/BANDAGES/DRESSINGS) IMPLANT
ELECT REM PT RETURN 9FT ADLT (ELECTROSURGICAL) ×1 IMPLANT
ELECTRODE REM PT RTRN 9FT ADLT (ELECTROSURGICAL) ×1 IMPLANT
GAUZE PAD ABD 8X10 STRL (GAUZE/BANDAGES/DRESSINGS) IMPLANT
GAUZE SPONGE 4X4 12PLY STRL (GAUZE/BANDAGES/DRESSINGS) IMPLANT
GLOVE BIO SURGEON STRL SZ 6.5 (GLOVE) ×3 IMPLANT
GLOVE BIO SURGEON STRL SZ7.5 (GLOVE) ×3 IMPLANT
GLOVE BIOGEL PI IND STRL 6.5 (GLOVE) ×1 IMPLANT
GLOVE BIOGEL PI IND STRL 7.5 (GLOVE) ×1 IMPLANT
GOWN STRL REUS W/ TWL LRG LVL3 (GOWN DISPOSABLE) ×3 IMPLANT
GUIDE PIN 3.2X343 (PIN) ×1 IMPLANT
GUIDE ROD 3.0 (MISCELLANEOUS) ×1 IMPLANT
KIT BASIN OR (CUSTOM PROCEDURE TRAY) ×1 IMPLANT
KIT TURNOVER KIT B (KITS) ×1 IMPLANT
NAIL IM CANN LOCK 10X200 (Nail) IMPLANT
NS IRRIG 1000ML POUR BTL (IV SOLUTION) ×1 IMPLANT
PACK ORTHO EXTREMITY (CUSTOM PROCEDURE TRAY) ×1 IMPLANT
PAD ARMBOARD POSITIONER FOAM (MISCELLANEOUS) ×2 IMPLANT
PAD CAST 4YDX4 CTTN HI CHSV (CAST SUPPLIES) ×1 IMPLANT
PADDING CAST SYNTHETIC 4X4 STR (CAST SUPPLIES) IMPLANT
PIN GUIDE 3.2X343MM (PIN) IMPLANT
ROD GUIDE 3.0 (MISCELLANEOUS) IMPLANT
SCREW TRIGEN LOW PROF 5.0X27.5 (Screw) IMPLANT
SCREW TRIGEN LOW PROF 5.0X30 (Screw) IMPLANT
SCREW TRIGEN LOW PROF 5.0X80 (Screw) IMPLANT
SCREW TRIGEN LOW PROF 5.0X90 (Screw) IMPLANT
SOAP 2 % CHG 4 OZ (WOUND CARE) ×1 IMPLANT
SPLINT PLASTER CAST FAST 5X30 (CAST SUPPLIES) IMPLANT
SPONGE T-LAP 18X18 ~~LOC~~+RFID (SPONGE) ×1 IMPLANT
STAPLER VISISTAT 35W (STAPLE) IMPLANT
STRIP CLOSURE SKIN 1/2X4 (GAUZE/BANDAGES/DRESSINGS) IMPLANT
SUCTION TUBE FRAZIER 10FR DISP (SUCTIONS) ×1 IMPLANT
SUT ETHILON 3 0 PS 1 (SUTURE) ×1 IMPLANT
SUT MON AB 2-0 CT1 36 (SUTURE) IMPLANT
SUT VIC AB 0 CT1 27XBRD ANBCTR (SUTURE) ×1 IMPLANT
SUT VIC AB 2-0 CT1 TAPERPNT 27 (SUTURE) ×2 IMPLANT
SUT VIC AB 3-0 PS2 18XBRD (SUTURE) ×1 IMPLANT
TOWEL GREEN STERILE (TOWEL DISPOSABLE) ×1 IMPLANT
TOWEL GREEN STERILE FF (TOWEL DISPOSABLE) ×1 IMPLANT
TUBE CONNECTING 12X1/4 (SUCTIONS) ×1 IMPLANT
WATER STERILE IRR 1000ML POUR (IV SOLUTION) ×1 IMPLANT

## 2023-05-15 NOTE — Anesthesia Procedure Notes (Signed)
 Anesthesia Regional Block: Adductor canal block   Pre-Anesthetic Checklist: , timeout performed,  Correct Patient, Correct Site, Correct Laterality,  Correct Procedure, Correct Position, site marked,  Risks and benefits discussed,  Surgical consent,  Pre-op evaluation,  At surgeon's request and post-op pain management  Laterality: Right  Prep: Maximum Sterile Barrier Precautions used, chloraprep       Needles:  Injection technique: Single-shot  Needle Type: Echogenic Stimulator Needle     Needle Length: 9cm  Needle Gauge: 22     Additional Needles:   Procedures:,,,, ultrasound used (permanent image in chart),,    Narrative:  Start time: 05/15/2023 8:25 AM End time: 05/15/2023 8:30 AM Injection made incrementally with aspirations every 5 mL.  Performed by: Personally  Anesthesiologist: Lannie Fields, DO  Additional Notes: Monitors applied. No increased pain on injection. No increased resistance to injection. Injection made in 5cc increments. Good needle visualization. Patient tolerated procedure well.

## 2023-05-15 NOTE — TOC Progression Note (Signed)
 Transition of Care [facility]) - Progression Note    Patient Details  Name: Harold [name] MRN: [id number] Date of Birth: [birth year]  [facility] CM/SW Contact  Lorri Frederick, LCSW Phone Number: 05/15/2023, 3:39 PM  Clinical Narrative:   CSW spoke with pt husband [name], discussed SNF. He was told daily copay would be $650, discussed that is not accurate, daily copay more like $200.  [name] reports they could afford this for a time.  Would be interested in return to [facility], will not go to [facility].    CSW spoke with [facility].  Last admission there was 11/26-12/15.  They are not able to readmit him.  1500: referral sent out in hub to SNF.  CSW spoke with [name] again, they are most interested in LTAC, which is what they were pursuing last admission.  He is asking if the reinjury would make LTAC an option.  Otherwise SNF.  He is aware that if none of this works, would be DC home with [facility], he has been impressed with [facility] so far.    CSW reached out to [facility].    Expected Discharge Plan: Skilled Nursing Facility Barriers to Discharge: Continued Medical Work up  Expected Discharge Plan and Services In-house Referral: Clinical Social Work     Living arrangements for the past 2 months: Single Family Home                                       Social Determinants of Health (SDOH) Interventions SDOH Screenings   Food Insecurity: No Food Insecurity (05/12/2023)  Housing: Low Risk  (05/12/2023)  Transportation Needs: No Transportation Needs (05/12/2023)  Utilities: Not At Risk (05/12/2023)  Social Connections: Moderately Integrated (05/12/2023)  Tobacco Use: Medium Risk (05/15/2023)    Readmission Risk Interventions     No data to display

## 2023-05-15 NOTE — Progress Notes (Signed)
 [facility] PROGRESS NOTE    Progress Note  Harold [name]  ZOX:[id number] DOB: 08-29-1955 DOA: 05/12/2023 PCP: [name], MD     Brief Narrative:   Harold [name] is an 68 y.o. male past medical history of morbid obesity, obstructive sleep apnea diabetes mellitus type 2 on A1c, CAD with a history of stent, atrial fibrillation on Eliquis, carotid stenosis, chronic kidney disease stage IIIb, diabetic peripheral neuropathy, hypothyroidism recently discharged on 05/10/2018 for comes in for right ankle pain [facility] surgery was consulted underwent external fixation of the right ankle with closed reduction, plan to repeat surgery on 05/15/2023  Assessment/Plan:   Open right ankle fracture, sequela: On November 2024 underwent ORIF for ankle fracture with revision in December 2024 for broken hardware. 03/31/2023 underwent hardware removal due to infection. 04/02/2023 underwent I&D by Dr. Lajoyce Corners. Now readmitted on 05/12/2023 for external fixation of the right ankle on the same day. Plan to return to the OR on 05/15/2022 for removal of external device and definite fixation of the right ankle. Continue narcotics, anticoagulation and weightbearing per Ortho.  Acute postoperative blood loss anemia/chronic kidney disease IV: Status post 1 unit packed red blood cells hemoglobin this morning 7.6. Transfuse 1 unit of packed red blood cells.  Check CBC post transfusional Keep hemoglobin above 8 due to coronary artery disease. CBC in the morning.  Chronic renal disease, stage IV (HCC) Renal function is stable, his baseline is around 2.6-3. He required HD during this dialysis.  Hypomagnesemia: repleted orally magnesium 1.8. Continue oral supplementation.  Paroxysmal atrial fibrillation: Currently on amiodarone, Eliquis on hold for surgical intervention. Orthopedic surgery to dictate when to restart anticoagulation.  CAD, LAD/CFX DES 04/28/12- staged RCA DES 04/29/12 after pt declined CABG,  cath 05/14/13  stable CAD medical therapy: Continue Plavix denies chest pain or shortness of breath.  Struct of sleep apnea: Continue CPAP.  Anxiety and depression:  continue Cymbalta.  Chronic hypotension: Continue midodrine.  OSA on CPAP Continue CPAP.  Acquired hypothyroidism Continue Synthroid.  Uncontrolled type 2 diabetes mellitus with hyperglycemia, with long-term current use of insulin: Blood glucose significantly elevated. Last A1c of 7. Will start long-acting insulin, continue sliding scale insulin.  Chronic gout: Continue colchicine.  Morbid obesity: Noted  RN Pressure Injury Documentation: Pressure Injury 04/17/23 Ankle Right;Anterior Deep Tissue Pressure Injury - Purple or maroon localized area of discolored intact skin or blood-filled blister due to damage of underlying soft tissue from pressure and/or shear. (Active)  04/17/23 2040  Location: Ankle  Location Orientation: Right;Anterior  Staging: Deep Tissue Pressure Injury - Purple or maroon localized area of discolored intact skin or blood-filled blister due to damage of underlying soft tissue from pressure and/or shear.  Wound Description (Comments):   Present on Admission: Yes  Dressing Type Gauze (Comment);Compression wrap 05/14/23 2120      DVT prophylaxis: Lovenox Family Communication:none Status is: Inpatient Remains inpatient appropriate because: Open right ankle fracture    Code Status:     Code Status Orders  (From admission, onward)           Start     Ordered   05/12/23 1338  Full code  Continuous       Question:  By:  Answer:  Other   05/12/23 1343           Code Status History     Date Active Date Inactive Code Status Order ID Comments User Context   03/27/2023 2243 05/10/2023 1618 Full Code [id number]  Howerter, Jill Alexanders  B, DO ED   01/09/2023 0555 01/14/2023 1612 Full Code [id number]  Dolly Rias, MD ED   01/02/2023 0828 01/07/2023 1852 Full Code [id number]  Clydie Braun, MD ED   01/02/2023 0813 01/02/2023 0828 Full Code [id number]  Clydie Braun, MD ED   11/06/2022 1411 11/07/2022 2159 Full Code [id number]  Iran Ouch, MD Inpatient   07/26/2020 1434 07/27/2020 0140 Full Code [id number]  Coletta Memos, MD Inpatient   05/14/2013 1827 05/15/2013 1657 Full Code [id number]  Lennette Bihari, MD Inpatient   05/13/2013 2244 05/14/2013 1827 Full Code [id number]  Barrett, Joline Salt, PA-C Inpatient      Advance Directive Documentation    Flowsheet Row Most Recent Value  Type of Advance Directive Healthcare Power of Attorney, Living will  Pre-existing out of facility DNR order (yellow form or pink MOST form) --  "MOST" Form in Place? --         IV Access:   Peripheral IV   Procedures and diagnostic studies:   DG Ribs Unilateral W/Chest Right Result Date: 05/13/2023 CLINICAL DATA:  Rib pain EXAM: RIGHT RIBS AND CHEST - 3+ VIEW COMPARISON:  Chest x-ray 04/17/2023 FINDINGS: No fracture or other bone lesions are seen involving the ribs. There is no evidence of pneumothorax or pleural effusion. Both lungs are clear. The heart is enlarged. Left-sided central venous catheter tip projects over the distal SVC. IMPRESSION: 1. No evidence of rib fracture. 2. Cardiomegaly. Electronically Signed   By: Darliss Cheney M.D.   On: 05/13/2023 15:56   CT ANKLE RIGHT WO CONTRAST Result Date: 05/13/2023 CLINICAL DATA:  One day postop following irrigation and debridement with external fixation of right trimalleolar ankle fracture EXAM: CT OF THE RIGHT ANKLE WITHOUT CONTRAST TECHNIQUE: Multidetector CT imaging of the right ankle was performed according to the standard protocol. Multiplanar CT image reconstructions were also generated. RADIATION DOSE REDUCTION: This exam was performed according to the departmental dose-optimization program which includes automated exposure control, adjustment of the mA and/or kV according to patient size and/or use of iterative reconstruction  technique. COMPARISON:  X-ray 05/12/2023, CT 03/30/2023 FINDINGS: Bones/Joint/Cartilage Status post external fixation of right ankle fracture with hardware spanning the calcaneal body and distal tibial diaphysis. Mildly displaced fracture of the distal fibular diaphysis with mild displacement. There is bony callus formation along the fracture site. Prior hardware removal of the distal fibula. Mildly distracted fracture through the base of the medial malleolus. Posterior malleolar fracture is posteriorly displaced by 7 mm. Evidence of prior hardware removal in the distal tibia as well as removal of prior subtalar hardware. Progressive tibiotalar joint space loss. Tibiotalar joint effusion with multiple foci of intra-articular air, likely related to recent surgery. Bones are demineralized, likely secondary to disuse. Alignment of the hindfoot and midfoot is maintained. Ligaments Suboptimally assessed by CT. Muscles and Tendons Tibialis posterior tendon is ill-defined. Generalized muscle atrophy. Soft tissues Diffuse soft tissue swelling about the ankle. There is air in the soft tissues at the medial aspect of the ankle, presumably related to prior surgery. Superimposed infection not excluded. IMPRESSION: 1. Status post external fixation of trimalleolar right ankle fracture. 2. Progressive tibiotalar joint space loss. 3. Diffuse soft tissue swelling about the ankle. There is air in the soft tissues at the medial aspect of the ankle, presumably related to prior surgery. Superimposed infection not excluded. 4. Tibialis posterior tendon is ill-defined. Electronically Signed   By: Duanne Guess D.O.   On: 05/13/2023 12:51  Medical Consultants:   None.   Subjective:    Harold [name] pain is controlled  Objective:    Vitals:   05/15/23 0030 05/15/23 0500 05/15/23 0512 05/15/23 0719  BP:   (!) 149/58 (!) 149/57  Pulse: 69  64 66  Resp:   15 17  Temp:   97.9 F (36.6 C) 98.2 F (36.8 C)   TempSrc:    Oral  SpO2: 98%  95% 97%  Weight:  129.2 kg    Height:       SpO2: 97 % O2 Flow Rate (L/min): 2 L/min FiO2 (%): 36 %   Intake/Output Summary (Last 24 hours) at 05/15/2023 0801 Last data filed at 05/15/2023 0107 Gross per 24 hour  Intake 10 ml  Output 800 ml  Net -790 ml   Filed Weights   05/12/23 0940 05/14/23 0428 05/15/23 0500  Weight: 136.1 kg 135.2 kg 129.2 kg    Exam: General exam: In no acute distress. Respiratory system: Good air movement and clear to auscultation. Cardiovascular system: S1 & S2 heard, RRR. No JVD. Gastrointestinal system: Abdomen is nondistended, soft and nontender.  Extremities: No pedal edema. Skin: No rashes, lesions or ulcers Psychiatry: Judgement and insight appear normal. Mood & affect appropriate.  Data Reviewed:    Labs: Basic Metabolic Panel: Recent Labs  Lab 05/09/23 0414 05/10/23 0358 05/12/23 0947 05/13/23 0648 05/14/23 0627 05/15/23 0543  NA 138 140 138 135 132* 132*  K 3.9 4.6 3.8 4.4 4.3 3.7  CL 105 102 101 101 100 97*  CO2 27 25 25 24 24 24   GLUCOSE 102* 99 229* 252* 217* 192*  BUN 30* 30* 29* 30* 28* 28*  CREATININE 4.23* 4.07* 3.66* 3.68* 3.42* 3.07*  CALCIUM 8.2* 8.8* 8.7* 8.2* 8.3* 8.5*  MG  --   --   --  1.4* 1.7 1.8  PHOS 4.6 4.6  --  4.0  --   --    GFR Estimated Creatinine Clearance: 32 mL/min (A) (by C-G formula based on SCr of 3.07 mg/dL (H)). Liver Function Tests: Recent Labs  Lab 05/09/23 0414 05/10/23 0358 05/13/23 0648  AST  --   --  11*  ALT  --   --  9  ALKPHOS  --   --  73  BILITOT  --   --  0.6  PROT  --   --  5.7*  ALBUMIN 2.6* 2.6* 2.6*   No results for input(s): "LIPASE", "AMYLASE" in the last 168 hours. No results for input(s): "AMMONIA" in the last 168 hours. Coagulation profile Recent Labs  Lab 05/12/23 0947  INR 1.5*   COVID-19 Labs  No results for input(s): "DDIMER", "FERRITIN", "LDH", "CRP" in the last 72 hours.  Lab Results  Component Value Date    SARSCOV2NAA NEGATIVE 03/27/2023   SARSCOV2NAA NEGATIVE 07/24/2020   SARSCOV2NAA NEGATIVE 10/09/2019    CBC: Recent Labs  Lab 05/12/23 0947 05/13/23 0648 05/14/23 0627 05/15/23 0543  WBC 6.9 7.1 6.8 5.1  HGB 9.0* 7.0* 7.9* 7.6*  HCT 27.5* 21.7* 23.9* 23.0*  MCV 92.3 91.9 90.5 89.1  PLT 162 135* 122* 141*   Cardiac Enzymes: No results for input(s): "CKTOTAL", "CKMB", "CKMBINDEX", "TROPONINI" in the last 168 hours. BNP (last 3 results) No results for input(s): "PROBNP" in the last 8760 hours. CBG: Recent Labs  Lab 05/14/23 0603 05/14/23 1155 05/14/23 1644 05/14/23 1957 05/15/23 0636  GLUCAP 190* 184* 161* 177* 175*   D-Dimer: No results for input(s): "DDIMER" in the last  72 hours. Hgb A1c: No results for input(s): "HGBA1C" in the last 72 hours. Lipid Profile: No results for input(s): "CHOL", "HDL", "LDLCALC", "TRIG", "CHOLHDL", "LDLDIRECT" in the last 72 hours. Thyroid function studies: No results for input(s): "TSH", "T4TOTAL", "T3FREE", "THYROIDAB" in the last 72 hours.  Invalid input(s): "FREET3" Anemia work up: No results for input(s): "VITAMINB12", "FOLATE", "FERRITIN", "TIBC", "IRON", "RETICCTPCT" in the last 72 hours. Sepsis Labs: Recent Labs  Lab 05/12/23 0947 05/13/23 0648 05/14/23 0627 05/15/23 0543  WBC 6.9 7.1 6.8 5.1   Microbiology Recent Results (from the past 240 hours)  Surgical pcr screen     Status: None   Collection Time: 05/12/23  5:04 PM   Specimen: Nasal Mucosa; Nasal Swab  Result Value Ref Range Status   MRSA, PCR NEGATIVE NEGATIVE Final   Staphylococcus aureus NEGATIVE NEGATIVE Final    Comment: (NOTE) The Xpert SA Assay (FDA approved for NASAL specimens in patients 77 years of age and older), is one component of a comprehensive surveillance program. It is not intended to diagnose infection nor to guide or monitor treatment. Performed at [facility], [address], [place], [place] [address]      Medications:     chlorhexidine       allopurinol  50 mg Oral Daily   amiodarone  200 mg Oral BID   atorvastatin  40 mg Oral Daily   B-complex with vitamin C  1 tablet Oral Daily   Chlorhexidine Gluconate Cloth  6 each Topical Daily   colchicine  0.6 mg Oral QODAY   vitamin B-12  1,000 mcg Oral Daily   docusate sodium  100 mg Oral BID   DULoxetine  60 mg Oral Daily   enoxaparin (LOVENOX) injection  40 mg Subcutaneous Q24H   ezetimibe  10 mg Oral Daily   feeding supplement  237 mL Oral BID BM   ferrous sulfate  325 mg Oral Q breakfast   gabapentin  300 mg Oral QHS   insulin aspart  0-5 Units Subcutaneous QHS   insulin aspart  0-9 Units Subcutaneous TID WC   insulin aspart  2 Units Subcutaneous TID WC   insulin glargine-yfgn  5 Units Subcutaneous QHS   levothyroxine  88 mcg Oral Q0600   midodrine  5 mg Oral TID WC   sodium chloride flush  10-40 mL Intracatheter Q12H   sodium chloride flush  3 mL Intravenous Q12H   sodium chloride flush  3-10 mL Intravenous Q12H   sodium chloride flush  3-10 mL Intravenous Q12H   Vitamin D (Ergocalciferol)  50,000 Units Oral Q7 days   Continuous Infusions:    LOS: 3 days   Marinda Elk  [facility]  05/15/2023, 8:01 AM

## 2023-05-15 NOTE — Op Note (Addendum)
 Orthopaedic Surgery Operative Note (CSN: [id number] ) Date of Surgery: 05/15/2023  Admit Date: 05/12/2023   Diagnoses: Pre-Op Diagnoses: Right open trimalleolar ankle fracture  Post-Op Diagnosis: Same  Procedures: CPT 27870-Right ankle arthrodesis CPT 28725-Right subtalar arthrodesis CPT 20694-Removal of external fixation of right leg  Surgeons : Primary: Laneta Pintos, MD  Assistant: Alona Jamaica, PA-C  Location: OR 3   Anesthesia: General with regional block  Antibiotics: Ancef  2g preop    Tourniquet time: None    Estimated Blood Loss: 100 mL  Complications:None  Specimens:* No specimens in log *   Implants: Implant Name Type Inv. Item Serial No. Manufacturer Lot No. LRB No. Used Action  NAIL IM CANN LOCK 10X200 - [id number] Nail NAIL IM CANN LOCK 10X200  [employer] [id number] Right 1 Implanted  TRIGEN L-P SCREW 5.58mm X 80mm Screw   [employer] [id number] Right 1 Implanted  SCREW TRIGEN LOW PROF 5.0X90 - [id number] Screw SCREW TRIGEN LOW PROF 5.0X90  [employer] [id number] Right 1 Implanted  SCREW TRIGEN LOW PROF 5.0X30 - [id number] Screw SCREW TRIGEN LOW PROF 5.0X30  [employer] [id number] Right 1 Implanted  SCREW TRIGEN LOW PROF 5.0X27.5 - [id number] Screw SCREW TRIGEN LOW PROF 5.0X27.5  [employer] [id number] Right 1 Implanted     Indications for Surgery: 68 year old male who had a right open ankle fracture dislocation and underwent open reduction internal fixation with Dr. Guyann Leitz in November 2024.  He subsequently developed a postoperative infection requiring I&D and removal of hardware.  He eventually was discharged home but unfortunately sustained a fall and refractured through his right ankle.  The bone also came through the skin again.  He was taken for I&D and external fixation of his right ankle.  Due to his previous infection as well as his refracture I recommend proceeding with a  hindfoot fusion nail for stabilization of his ankle.  Risk and benefits were discussed with the patient and his husband.  Risks include but not limited to bleeding, infection, malunion, nonunion, ankle pain, arthritis, nerve and blood vessel injury, DVT, even possibility anesthetic complications.  He agreed to proceed with surgery and consent was obtained.  Operative Findings: 1.  Removal of external fixator from right lower extremity. 2.  Hindfoot fusion nail placement with arthrodesis of right subtalar joint and right tibiotalar joint using [employer] hindfoot fusion nail 10 x 200 mm nail  Procedure: The patient was identified in the preoperative holding area. Consent was confirmed with the patient and their family and all questions were answered. The operative extremity was marked after confirmation with the patient. he was then brought back to the operating room by our anesthesia colleagues.  He was carefully transferred over to radiolucent flattop table.  He was placed under general anesthetic.  The right lower extremity had the external fixator taken off.  His right lower extremity was then prepped and draped in usual sterile fashion.  A timeout was performed to verify the patient, the procedure, and the extremity.  Preoperative antibiotics were dosed.  Fluoroscopic imaging showed the unstable nature of his injury.  A guidewire for the hindfoot fusion nail was placed at the appropriate position on the lateral and AP views.  It was advanced through the calcaneus and through the subtalar joint and passed the tibial talar joint.  The ankle was held in a neutral dorsiflexion and neutral valgus alignment.  I then cut down on the guidewire and proceeded  to ream through the calcaneus and through the subtalar joint and through the tibial talar joint. The reaming of the tibiotalar and subtalar joint removed the articular surface for preparation of the fusion. Open incision would have been to risky for  preparation of the joint for arthrodesis.  I then passed a ball-tipped guidewire down the center of the canal.  I then proceeded to ream from 9 mm to 11.5 mm.  I placed a 10 x 200 mm nail.  Using the targeting arm I then placed a posterior to anterior screw through the calcaneus directed to the cuboid.  I then placed a another screw going from the calcaneus across the subtalar joint into the talus.  Excellent fixation was obtained.  I then performed perfect circle technique to place 2 proximal interlocking screws.  Fluoroscopic imaging was obtained which showed adequate alignment.  The incisions were irrigated and closed with 3-0 nylon suture.  Sterile dressings were applied and a short leg splint was then placed.  The patient was then awoke from anesthesia and taken to the PACU in stable condition.  Post Op Plan/Instructions: Patient be weightbearing for transfers on his right lower extremity.  We will have him mobilize with physical and Occupational Therapy.  He will receive postoperative Ancef .  For DVT prophylaxis I will recommend Lovenox  while inpatient with aspirin  for DVT prophylaxis on discharge.  I was present and performed the entire surgery.  Alona Jamaica, PA-C did assist me throughout the case. An assistant was necessary given the difficulty in approach, maintenance of reduction and ability to instrument the fracture.   Katheryne Pane, MD [facility]

## 2023-05-15 NOTE — Transfer of Care (Signed)
 Immediate Anesthesia Transfer of Care Note  Patient: Harold [name]  Procedure(s) Performed: ARTHRODESIS ANKLE WITH HINDFOOT FUSION NAIL (Right: Ankle)  Patient Location: PACU  Anesthesia Type:General  Level of Consciousness: awake and alert   Airway & Oxygen Therapy: Patient Spontanous Breathing and Patient connected to face mask oxygen  Post-op Assessment: Report given to RN and Post -op Vital signs reviewed and stable  Post vital signs: Reviewed and stable  Last Vitals:  Vitals Value Taken Time  BP 148/64 05/15/23 1037  Temp    Pulse 63 05/15/23 1044  Resp 17 05/15/23 1044  SpO2 98 % 05/15/23 1044  Vitals shown include unfiled device data.  Last Pain:  Vitals:   05/15/23 0810  TempSrc: Oral  PainSc:       Patients Stated Pain Goal: 3 (05/14/23 1656)  Complications: No notable events documented.

## 2023-05-15 NOTE — Anesthesia Procedure Notes (Signed)
 Procedure Name: LMA Insertion Date/Time: 05/15/2023 9:07 AM  Performed by: Heron Sabins, CRNAPre-anesthesia Checklist: Patient identified, Emergency Drugs available, Suction available and Patient being monitored Patient Re-evaluated:Patient Re-evaluated prior to induction Oxygen Delivery Method: Circle System Utilized Preoxygenation: Pre-oxygenation with 100% oxygen Induction Type: IV induction Ventilation: Mask ventilation without difficulty LMA: LMA inserted LMA Size: 5.0 Number of attempts: 1 Airway Equipment and Method: Bite block Placement Confirmation: positive ETCO2 Tube secured with: Tape Dental Injury: Teeth and Oropharynx as per pre-operative assessment

## 2023-05-15 NOTE — Progress Notes (Signed)
 Patient in short stay. CBG 171 @ 0807. At 06:42 o'clock patient received 2 units Novolog for CBG 175. No need to start pre-op hyperglycemic protocol at this time per Dr. Salvadore Farber

## 2023-05-15 NOTE — Significant Event (Signed)
 Rapid Response Event Note   Reason for Call :  RR paged to room for decreased MS. Per RN, pt was alert and oriented when she gave his 2200 meds. When she assessed him around 2230, she found him very sleepy, able to answer only a few questions, and drenched in sweat.   Initial Focused Assessment:  Pt lying in bed with eyes closed. He is very sleepy. Pt's MS improved with continuous verbal stimulation. He is alert x 4. He moves all extremities equally(R leg not tested d/t surgery) and follows commands. NIH-5 for not knowing the month, slurred speech, and R leg weakness(surgery). After a few minutes of stimulation, pt new the month and his speech was clear). NIH-3. He denies CP/SOB/dizziness/headache.  RN says he c/o a mild headache earlier in night. Pupils 3, equal, reactive.  Lung diminished t/o. ABD large, soft, NT. Skin hot to touch.  T-101.3(Ax), HR-81, BP-138/62, RR-22, SpO2-98% on 2L [place]  Interventions:  CBG-211 Tylenol(already ordered) CBC,LA, Blood cxs, U/A, ABG EKG CPAP Plan of Care:  Allow time for tylenol to work, recheck temp. Await ABG results. Pt has order for CPAP at night. Please call RRT if further assistance needed.   Event Summary:   MD Notified: Virgel Manifold, NP Call [phone] Arrival [phone] End Time:2320  Terrilyn Saver, RN

## 2023-05-15 NOTE — Progress Notes (Signed)
   05/15/23 0030  BiPAP/CPAP/SIPAP  $ Non-Invasive Ventilator  Non-Invasive Vent Subsequent  BiPAP/CPAP/SIPAP Pt Type Adult  BiPAP/CPAP/SIPAP Resmed  Mask Type Nasal mask  Mask Size Medium  EPAP 4 cmH2O  FiO2 (%) 36 %  Flow Rate 4 lpm  Patient Home Machine No  Safety Check Completed by RT for Home Unit Yes, no issues noted  Patient Home Mask No  Patient Home Tubing No  Auto Titrate No  CPAP/SIPAP surface wiped down Yes  Device Plugged into RED Power Outlet Yes  BiPAP/CPAP /SiPAP Vitals  Pulse Rate 69  SpO2 98 %  Bilateral Breath Sounds Diminished  MEWS Score/Color  MEWS Score 0  MEWS Score Color [place]

## 2023-05-15 NOTE — Progress Notes (Signed)
 Ortho Trauma Note  We will plan to proceed with hindfoot fusion nail of his right ankle fracture.  He has complex history with this ankle.  Unfortunately we cannot proceed with another open reduction internal fixation due to the wound issues and the previous infection.  Other than maintaining an external fixator the hindfoot fusion nail allow [place] to weight-bear earlier and provide stabilization with minimal incisions that would be at risk for infection.  Risks and benefits were discussed with the patient.  Risks included but not limited to bleeding, infection, posttraumatic arthritis, ankle stiffness, nerve and blood vessel injury, need for further surgery including formal fusion, even the possibility of loss of limb if devastating complication like infection occurred, DVT, even the possibility anesthetic complications.  They agreed to proceed with surgery and consent was obtained.  Roby Lofts, MD [facility] [phone] (office) [email or web address]

## 2023-05-15 NOTE — H&P (View-Only) (Signed)
 Ortho Trauma Note  We will plan to proceed with hindfoot fusion nail of his right ankle fracture.  He has complex history with this ankle.  Unfortunately we cannot proceed with another open reduction internal fixation due to the wound issues and the previous infection.  Other than maintaining an external fixator the hindfoot fusion nail allow [place] to weight-bear earlier and provide stabilization with minimal incisions that would be at risk for infection.  Risks and benefits were discussed with the patient.  Risks included but not limited to bleeding, infection, posttraumatic arthritis, ankle stiffness, nerve and blood vessel injury, need for further surgery including formal fusion, even the possibility of loss of limb if devastating complication like infection occurred, DVT, even the possibility anesthetic complications.  They agreed to proceed with surgery and consent was obtained.  Roby Lofts, MD [facility] [phone] (office) [email or web address]

## 2023-05-15 NOTE — Anesthesia Preprocedure Evaluation (Signed)
 Anesthesia Evaluation  Patient identified by MRN, date of birth, ID band Patient awake    Reviewed: Allergy & Precautions, H&P , NPO status , Patient's Chart, lab work & pertinent test results  History of Anesthesia Complications (+) PONV and history of anesthetic complications  Airway Mallampati: III  TM Distance: >3 FB Neck ROM: Full    Dental  (+) Poor Dentition, Dental Advisory Given   Pulmonary sleep apnea , COPD, former smoker   Pulmonary exam normal breath sounds clear to auscultation       Cardiovascular hypertension, Pt. on medications pulmonary hypertension (mild phtn on echo)+ CAD (PCI 2003, 2014), + Peripheral Vascular Disease and +CHF (normal lvef, grade 2 diastolic dysfunction)  Normal cardiovascular exam+ dysrhythmias Atrial Fibrillation  Rhythm:Regular Rate:Normal  Echo 03/2023 1. Left ventricular ejection fraction, by estimation, is 55 to 60%. The  left ventricle has normal function. The left ventricle demonstrates  regional wall motion abnormalities with basal inferior hypokinesis. The  left ventricular internal cavity size was   mildly dilated. Left ventricular diastolic parameters are consistent with  Grade II diastolic dysfunction (pseudonormalization).   2. Right ventricular systolic function is normal. The right ventricular  size is mildly enlarged. There is mildly elevated pulmonary artery  systolic pressure. The estimated right ventricular systolic pressure is  38.3 mmHg.   3. Left atrial size was moderately dilated.   4. Right atrial size was mildly dilated.   5. The mitral valve is normal in structure. Trivial mitral valve  regurgitation. No evidence of mitral stenosis.   6. The aortic valve is tricuspid. There is mild calcification of the  aortic valve. Aortic valve regurgitation is not visualized. No aortic  stenosis is present.   7. The inferior vena cava is normal in size with greater than 50%   respiratory variability, suggesting right atrial pressure of 3 mmHg.     Neuro/Psych  PSYCHIATRIC DISORDERS Anxiety Depression    negative neurological ROS     GI/Hepatic Neg liver ROS,GERD  Controlled,,  Endo/Other  diabetesHypothyroidism  BMI 38  Renal/GU ESRFRenal disease  negative genitourinary   Musculoskeletal negative musculoskeletal ROS (+)    Abdominal  (+) + obese  Peds negative pediatric ROS (+)  Hematology  (+) Blood dyscrasia, anemia Hb 7.6, plt 141   Anesthesia Other Findings   Reproductive/Obstetrics negative OB ROS                              Anesthesia Physical Anesthesia Plan  ASA: 3  Anesthesia Plan: General and Regional   Post-op Pain Management: Regional block* and Ofirmev IV (intra-op)*   Induction: Intravenous  PONV Risk Score and Plan: 3 and Ondansetron, Dexamethasone, Midazolam and Treatment may vary due to age or medical condition  Airway Management Planned: LMA  Additional Equipment: None  Intra-op Plan:   Post-operative Plan: Extubation in OR  Informed Consent: I have reviewed the patients History and Physical, chart, labs and discussed the procedure including the risks, benefits and alternatives for the proposed anesthesia with the patient or authorized representative who has indicated his/her understanding and acceptance.     Dental advisory given  Plan Discussed with: CRNA  Anesthesia Plan Comments:          Anesthesia Quick Evaluation

## 2023-05-15 NOTE — Progress Notes (Signed)
 PT Cancellation Note  Patient Details Name: NEVAN CREIGHTON MRN: [id number] DOB: Mar 12, 1955   Cancelled Treatment:    Reason Eval/Treat Not Completed: Patient at procedure or test/unavailable - at OR, will check back as schedule allows.  Marye Round, PT DPT [facility] Secure Chat Preferred  Office [phone]    Truddie Coco 05/15/2023, 8:57 AM

## 2023-05-15 NOTE — Anesthesia Postprocedure Evaluation (Signed)
 Anesthesia Post Note  Patient: Harold [name]  Procedure(s) Performed: ARTHRODESIS ANKLE WITH HINDFOOT FUSION NAIL (Right: Ankle)     Patient location during evaluation: PACU Anesthesia Type: Regional and General Level of consciousness: awake and alert, oriented and patient cooperative Pain management: pain level controlled Vital Signs Assessment: post-procedure vital signs reviewed and stable Respiratory status: spontaneous breathing, nonlabored ventilation and respiratory function stable Cardiovascular status: blood pressure returned to baseline and stable Postop Assessment: no apparent nausea or vomiting Anesthetic complications: no   No notable events documented.  Last Vitals:  Vitals:   05/15/23 1115 05/15/23 1137  BP: (!) 141/60 (!) 141/62  Pulse: 61 60  Resp: 18 16  Temp: 36.4 C 36.6 C  SpO2: 95% 100%    Last Pain:  Vitals:   05/15/23 1218  TempSrc:   PainSc: 8                  Lannie Fields

## 2023-05-15 NOTE — Progress Notes (Signed)
   05/15/23 2335  BiPAP/CPAP/SIPAP  $ Non-Invasive Ventilator  Non-Invasive Vent Subsequent  BiPAP/CPAP/SIPAP Pt Type Adult  BiPAP/CPAP/SIPAP Resmed  Mask Type Nasal mask  Mask Size Medium  EPAP 4 cmH2O  FiO2 (%) 36 %  Flow Rate 4 lpm  Patient Home Mask No  Patient Home Tubing No  Auto Titrate No  CPAP/SIPAP surface wiped down Yes  Device Plugged into RED Power Outlet Yes  BiPAP/CPAP /SiPAP Vitals  Pulse Rate 82  SpO2 98 %  Bilateral Breath Sounds Diminished  MEWS Score/Color  MEWS Score 2  MEWS Score Color Yellow

## 2023-05-15 NOTE — [place] FL2 (Signed)
 [place] MEDICAID FL2 LEVEL OF CARE FORM     IDENTIFICATION  Patient Name: Harold [name] Birthdate: [birth year] Sex: male Admission Date (Current Location): 05/12/2023  [facility] and [place] Number:  Producer, television/film/video and Address:  The . [facility], [address] [place], [place] [address]      Provider Number: [id number]  Attending Physician Name and Address:  Marinda Elk, MD  Relative Name and Phone Number:  Benton-Elliot,George Spouse [phone]    Current Level of Care: Hospital Recommended Level of Care: Skilled Nursing Facility Prior Approval Number:    Date Approved/Denied:   PASRR Number: [id number]  Discharge Plan: SNF    Current Diagnoses: Patient Active Problem List   Diagnosis Date Noted   Open right ankle fracture, sequela 05/12/2023   Obesity, Class II, BMI 35-39.9 04/23/2023   Osteomyelitis (HCC) 04/17/2023   Cellulitis of right lower extremity 03/28/2023   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, right (HCC) 03/28/2023   Abscess of skin of right ankle 03/28/2023   PAD (peripheral artery disease) (HCC) 03/28/2023   Vitamin D deficiency 01/07/2023   Prolonged QT interval 01/02/2023   Acute kidney injury superimposed on stage 4 chronic kidney disease (HCC) 01/02/2023   Normocytic anemia 01/02/2023   Paroxysmal atrial fibrillation (HCC) 01/02/2023   Uncontrolled type 2 diabetes mellitus with hyperglycemia, with long-term current use of insulin (HCC) 01/02/2023   Acquired hypothyroidism 01/02/2023   CKD stage 3b, GFR 30-44 ml/min (HCC) 11/07/2022   CAD in native artery 11/07/2022   PVD (peripheral vascular disease) (HCC) 11/06/2022   Hypercoagulable state due to persistent atrial fibrillation (HCC) 02/19/2022   Persistent atrial fibrillation (HCC) 01/28/2022   Lumbar stenosis with neurogenic claudication 07/26/2020   Diabetic neuropathy (HCC) 08/24/2019   Trigger thumb of left hand 09/02/2018    Atherosclerosis of native artery of both lower extremities with intermittent claudication (HCC) 05/12/2018   Pain of left hand 03/26/2018   Trigger finger 03/03/2017   Pain in finger of left hand 03/03/2017   Snoring 08/20/2016   Morbid (severe) obesity due to excess calories (HCC) 04/18/2016   Venous stasis of both lower extremities - with edema 02/25/2015   Right-sided chest wall pain 05/08/2014   Gastroesophageal reflux disease without esophagitis 10/10/2013   Chronic heart failure with preserved ejection fraction (HFpEF) (HCC) 06/27/2013   COPD (chronic obstructive pulmonary disease) (HCC) 06/17/2013   Restrictive lung disease 06/17/2013   Obesity, Class III, BMI 40-49.9 (morbid obesity) (HCC) 09/01/2012   Generalized weakness 09/01/2012   Chronic renal disease, stage IV (HCC) 04/30/2012   OSA on CPAP 04/29/2012   Pulmonary hypertension, 04/29/2012   Dyspnea on exertion   04/26/2012   CAD, LAD/CFX DES 04/28/12- staged RCA DES 04/29/12 after pt declined CABG, cath 05/14/13 stable CAD medical therapy    Carotid artery stenosis without cerebral infarction, bilateral 11/07/2011   Diabetes mellitus type 2 with neurological manifestations (HCC) 04/29/2011   Essential hypertension 04/29/2011   Neuropathy 04/29/2011   Hyperlipidemia associated with type 2 diabetes mellitus (HCC) 04/29/2011   Anxiety and depression 04/29/2011    Orientation RESPIRATION BLADDER Height & Weight     Self, Time, Situation, Place  O2 Continent Weight: 284 lb 13.4 oz (129.2 kg) Height:  6\' 1"  (185.4 cm)  BEHAVIORAL SYMPTOMS/MOOD NEUROLOGICAL BOWEL NUTRITION STATUS      Incontinent Diet (see discharge summary)  AMBULATORY STATUS COMMUNICATION OF NEEDS Skin   Total Care Verbally Surgical wounds, Skin abrasions, Other (Comment) (  ecchymosis)                       Personal Care Assistance Level of Assistance  Bathing, Feeding, Dressing, Total care Bathing Assistance: Maximum assistance Feeding  assistance: Limited assistance Dressing Assistance: Maximum assistance Total Care Assistance: Maximum assistance   Functional Limitations Info  Sight, Hearing, Speech Sight Info: Adequate Hearing Info: Adequate Speech Info: Adequate    SPECIAL CARE FACTORS FREQUENCY  PT (By licensed PT), OT (By licensed OT)     PT Frequency: 5x week OT Frequency: 5x week            Contractures Contractures Info: Not present    Additional Factors Info  Code Status, Allergies, Insulin Sliding Scale Code Status Info: full Allergies Info: Doxycycline, Septra (Bactrim), Penicillins, Metformin And Related, Other, Sulfamethoxazole-trimethoprim, Wellbutrin (Bupropion)   Insulin Sliding Scale Info: Novolog       Current Medications (05/15/2023):  This is the current hospital active medication list Current Facility-Administered Medications  Medication Dose Route Frequency Provider Last Rate Last Admin   0.9 %  sodium chloride infusion (Manually program via Guardrails IV Fluids)   Intravenous Once West Bali, PA-C   Held at 05/15/23 0800   acetaminophen (TYLENOL) tablet 650 mg  650 mg Oral Q6H PRN West Bali, PA-C   650 mg at 05/15/23 1218   Or   acetaminophen (TYLENOL) suppository 650 mg  650 mg Rectal Q6H PRN West Bali, PA-C       allopurinol (ZYLOPRIM) tablet 50 mg  50 mg Oral Daily West Bali, PA-C   50 mg at 05/15/23 1222   amiodarone (PACERONE) tablet 200 mg  200 mg Oral BID West Bali, PA-C   200 mg at 05/15/23 1219   atorvastatin (LIPITOR) tablet 40 mg  40 mg Oral Daily West Bali, PA-C   40 mg at 05/15/23 1223   B-complex with vitamin C tablet 1 tablet  1 tablet Oral Daily West Bali, PA-C   1 tablet at 05/14/23 1610   ceFAZolin (ANCEF) IVPB 2g/100 mL premix  2 g Intravenous Q8H McClung, Sarah A, PA-C       Chlorhexidine Gluconate Cloth 2 % PADS 6 each  6 each Topical Daily West Bali, PA-C   6 each at 05/14/23 9604   colchicine tablet 0.6 mg   0.6 mg Oral QODAY West Bali, PA-C   0.6 mg at 05/13/23 1000   cyanocobalamin (VITAMIN B12) tablet 1,000 mcg  1,000 mcg Oral Daily West Bali, PA-C   1,000 mcg at 05/15/23 1222   docusate sodium (COLACE) capsule 100 mg  100 mg Oral BID West Bali, PA-C   100 mg at 05/15/23 1222   DULoxetine (CYMBALTA) DR capsule 60 mg  60 mg Oral Daily West Bali, PA-C   60 mg at 05/15/23 1222   enoxaparin (LOVENOX) injection 40 mg  40 mg Subcutaneous Q24H McClung, Sarah A, PA-C       ezetimibe (ZETIA) tablet 10 mg  10 mg Oral Daily Thyra Breed A, PA-C   10 mg at 05/15/23 1223   feeding supplement (ENSURE ENLIVE / ENSURE PLUS) liquid 237 mL  237 mL Oral BID BM West Bali, PA-C   237 mL at 05/14/23 1530   ferrous sulfate tablet 325 mg  325 mg Oral Q breakfast West Bali, PA-C   325 mg at 05/15/23 1219   gabapentin (NEURONTIN) capsule 300 mg  300  mg Oral QHS West Bali, PA-C   300 mg at 05/14/23 2120   hydrALAZINE (APRESOLINE) injection 10 mg  10 mg Intravenous Q6H PRN West Bali, PA-C       HYDROmorphone (DILAUDID) injection 0.5-1 mg  0.5-1 mg Intravenous Q4H PRN West Bali, PA-C   1 mg at 05/14/23 1703   insulin aspart (novoLOG) injection 0-5 Units  0-5 Units Subcutaneous QHS McClung, Sarah A, PA-C       insulin aspart (novoLOG) injection 0-9 Units  0-9 Units Subcutaneous TID WC West Bali, PA-C   2 Units at 05/15/23 1221   insulin aspart (novoLOG) injection 2 Units  2 Units Subcutaneous TID WC West Bali, PA-C   2 Units at 05/15/23 1308   insulin glargine-yfgn (SEMGLEE) injection 5 Units  5 Units Subcutaneous QHS West Bali, PA-C   5 Units at 05/14/23 2120   levothyroxine (SYNTHROID) tablet 88 mcg  88 mcg Oral Q0600 West Bali, PA-C   88 mcg at 05/15/23 0631   methocarbamol (ROBAXIN) tablet 500 mg  500 mg Oral Q6H PRN West Bali, PA-C   500 mg at 05/15/23 1218   Or   methocarbamol (ROBAXIN) injection 500 mg  500 mg Intravenous  Q6H PRN West Bali, PA-C       metoCLOPramide (REGLAN) tablet 5-10 mg  5-10 mg Oral Q8H PRN Sharon Seller, Sarah A, PA-C       Or   metoCLOPramide (REGLAN) injection 5-10 mg  5-10 mg Intravenous Q8H PRN Sharon Seller, Sarah A, PA-C       midodrine (PROAMATINE) tablet 5 mg  5 mg Oral TID WC McClung, Sarah A, PA-C   5 mg at 05/15/23 1220   nitroGLYCERIN (NITROSTAT) SL tablet 0.4 mg  0.4 mg Sublingual Q5 min PRN Thyra Breed A, PA-C       oxyCODONE (Oxy IR/ROXICODONE) immediate release tablet 5-10 mg  5-10 mg Oral Q4H PRN West Bali, PA-C   10 mg at 05/15/23 1219   polyethylene glycol (MIRALAX / GLYCOLAX) packet 17 g  17 g Oral Daily PRN West Bali, PA-C       sodium chloride flush (NS) 0.9 % injection 10-40 mL  10-40 mL Intracatheter Q12H West Bali, PA-C   10 mL at 05/14/23 2122   sodium chloride flush (NS) 0.9 % injection 10-40 mL  10-40 mL Intracatheter PRN West Bali, PA-C       sodium chloride flush (NS) 0.9 % injection 3 mL  3 mL Intravenous Q12H West Bali, PA-C   3 mL at 05/14/23 0948   sodium chloride flush (NS) 0.9 % injection 3-10 mL  3-10 mL Intravenous Q12H West Bali, PA-C   10 mL at 05/13/23 1031   Vitamin D (Ergocalciferol) (DRISDOL) 1.25 MG (50000 UNIT) capsule 50,000 Units  50,000 Units Oral Q7 days West Bali, PA-C   50,000 Units at 05/13/23 1309     Discharge Medications: Please see discharge summary for a list of discharge medications.  Relevant Imaging Results:  Relevant Lab Results:   Additional Information [id number]  Lorri Frederick, LCSW

## 2023-05-15 NOTE — Interval H&P Note (Signed)
 History and Physical Interval Note:  05/15/2023 8:18 AM  Harold [name]  has presented today for surgery, with the diagnosis of Right open ankle fracture.  The various methods of treatment have been discussed with the patient and family. After consideration of risks, benefits and other options for treatment, the patient has consented to  Procedure(s): ARTHRODESIS ANKLE WITH HINDFOOT FUSION NAIL (Right) as a surgical intervention.  The patient's history has been reviewed, patient examined, no change in status, stable for surgery.  I have reviewed the patient's chart and labs.  Questions were answered to the patient's satisfaction.     Caryn Bee P Inetta Dicke

## 2023-05-15 NOTE — Anesthesia Procedure Notes (Signed)
 Anesthesia Regional Block: Popliteal block   Pre-Anesthetic Checklist: , timeout performed,  Correct Patient, Correct Site, Correct Laterality,  Correct Procedure, Correct Position, site marked,  Risks and benefits discussed,  Surgical consent,  Pre-op evaluation,  At surgeon's request and post-op pain management  Laterality: Right  Prep: Maximum Sterile Barrier Precautions used, chloraprep       Needles:  Injection technique: Single-shot  Needle Type: Echogenic Stimulator Needle     Needle Length: 9cm  Needle Gauge: 22     Additional Needles:   Procedures:,,,, ultrasound used (permanent image in chart),,    Narrative:  Start time: 05/15/2023 8:20 AM End time: 05/15/2023 8:25 AM Injection made incrementally with aspirations every 5 mL.  Performed by: Personally  Anesthesiologist: Lannie Fields, DO  Additional Notes: Monitors applied. No increased pain on injection. No increased resistance to injection. Injection made in 5cc increments. Good needle visualization. Patient tolerated procedure well.

## 2023-05-16 ENCOUNTER — Encounter (HOSPITAL_COMMUNITY): Payer: Self-pay | Admitting: Student

## 2023-05-16 ENCOUNTER — Inpatient Hospital Stay (HOSPITAL_COMMUNITY)

## 2023-05-16 DIAGNOSIS — S82891S Other fracture of right lower leg, sequela: Secondary | ICD-10-CM | POA: Diagnosis not present

## 2023-05-16 LAB — TYPE AND SCREEN
ABO/RH(D): A POS
Antibody Screen: NEGATIVE
Unit division: 0
Unit division: 0

## 2023-05-16 LAB — BPAM RBC
Blood Product Expiration Date: 202504182359
Blood Product Expiration Date: 202504202359
ISSUE DATE / TIME: 202503251216
ISSUE DATE / TIME: 202503271704
Unit Type and Rh: 6200
Unit Type and Rh: 6200

## 2023-05-16 LAB — BLOOD CULTURE ID PANEL (REFLEXED) - BCID2

## 2023-05-16 LAB — BASIC METABOLIC PANEL WITH GFR
Anion gap: 8 (ref 5–15)
BUN: 29 mg/dL — ABNORMAL HIGH (ref 8–23)
CO2: 25 mmol/L (ref 22–32)
Calcium: 7.8 mg/dL — ABNORMAL LOW (ref 8.9–10.3)
Chloride: 98 mmol/L (ref 98–111)
Creatinine, Ser: 3.23 mg/dL — ABNORMAL HIGH (ref 0.61–1.24)
GFR, Estimated: 20 mL/min — ABNORMAL LOW (ref >60)
Glucose, Bld: 224 mg/dL — ABNORMAL HIGH (ref 70–99)
Potassium: 4 mmol/L (ref 3.5–5.1)
Sodium: 131 mmol/L — ABNORMAL LOW (ref 135–145)

## 2023-05-16 LAB — GLUCOSE, CAPILLARY
Glucose-Capillary: 194 mg/dL — ABNORMAL HIGH (ref 70–99)
Glucose-Capillary: 194 mg/dL — ABNORMAL HIGH (ref 70–99)
Glucose-Capillary: 191 mg/dL — ABNORMAL HIGH (ref 70–99)
Glucose-Capillary: 171 mg/dL — ABNORMAL HIGH (ref 70–99)

## 2023-05-16 LAB — CBC
HCT: 24.9 % — ABNORMAL LOW (ref 39.0–52.0)
Hemoglobin: 8.3 g/dL — ABNORMAL LOW (ref 13.0–17.0)
MCH: 29.6 pg (ref 26.0–34.0)
MCHC: 33.3 g/dL (ref 30.0–36.0)
MCV: 88.9 fL (ref 80.0–100.0)
Platelets: 177 10*3/uL (ref 150–400)
RBC: 2.8 MIL/uL — ABNORMAL LOW (ref 4.22–5.81)
RDW: 15.3 % (ref 11.5–15.5)
WBC: 7.1 10*3/uL (ref 4.0–10.5)
nRBC: 0 % (ref 0.0–0.2)

## 2023-05-16 LAB — PROCALCITONIN: Procalcitonin: 1.51 ng/mL

## 2023-05-16 LAB — MAGNESIUM: Magnesium: 1.8 mg/dL (ref 1.7–2.4)

## 2023-05-16 LAB — LACTIC ACID, PLASMA: Lactic Acid, Venous: 1 mmol/L (ref 0.5–1.9)

## 2023-05-16 MED ORDER — CLOPIDOGREL BISULFATE 75 MG PO TABS
75.0000 mg | ORAL_TABLET | Freq: Every morning | ORAL | Status: DC
  Administered 2023-05-16 – 2023-05-21 (×6): 75 mg via ORAL
  Filled 2023-05-16 (×7): qty 1

## 2023-05-16 MED ORDER — SODIUM CHLORIDE 0.9 % IV SOLN
2.0000 g | Freq: Two times a day (BID) | INTRAVENOUS | Status: DC
  Administered 2023-05-16 – 2023-05-17 (×2): 2 g via INTRAVENOUS
  Filled 2023-05-16 (×2): qty 12.5

## 2023-05-16 MED ORDER — APIXABAN 5 MG PO TABS
5.0000 mg | ORAL_TABLET | Freq: Two times a day (BID) | ORAL | Status: DC
  Administered 2023-05-16 – 2023-05-21 (×11): 5 mg via ORAL
  Filled 2023-05-16 (×11): qty 1

## 2023-05-16 MED ORDER — POLYETHYLENE GLYCOL 3350 17 G PO PACK
17.0000 g | PACK | Freq: Two times a day (BID) | ORAL | Status: AC
  Administered 2023-05-16: 17 g via ORAL
  Filled 2023-05-16 (×4): qty 1

## 2023-05-16 NOTE — Progress Notes (Signed)
 PHARMACY - PHYSICIAN COMMUNICATION CRITICAL VALUE ALERT - BLOOD CULTURE IDENTIFICATION (BCID)  Harold [name] is an 68 y.o. male who presented to [facility] on 05/12/2023 with a chief complaint of ankle fracture now with fevers,   Name of physician (or Provider) Contacted: David Stall  Current antibiotics: cefazolin  Changes to prescribed antibiotics recommended:  Recommendations accepted by provider  Results for orders placed or performed during the hospital encounter of 05/12/23  Blood Culture ID Panel (Reflexed) (Collected: 05/16/2023  2:06 AM)  Result Value Ref Range   Enterococcus faecalis NOT DETECTED NOT DETECTED   Enterococcus Faecium NOT DETECTED NOT DETECTED   Listeria monocytogenes NOT DETECTED NOT DETECTED   Staphylococcus species NOT DETECTED NOT DETECTED   Staphylococcus aureus (BCID) NOT DETECTED NOT DETECTED   Staphylococcus epidermidis NOT DETECTED NOT DETECTED   Staphylococcus lugdunensis NOT DETECTED NOT DETECTED   Streptococcus species NOT DETECTED NOT DETECTED   Streptococcus agalactiae NOT DETECTED NOT DETECTED   Streptococcus pneumoniae NOT DETECTED NOT DETECTED   Streptococcus pyogenes NOT DETECTED NOT DETECTED   A.calcoaceticus-baumannii NOT DETECTED NOT DETECTED   Bacteroides fragilis NOT DETECTED NOT DETECTED   Enterobacterales DETECTED (A) NOT DETECTED   Enterobacter cloacae complex DETECTED (A) NOT DETECTED   Escherichia coli NOT DETECTED NOT DETECTED   Klebsiella aerogenes NOT DETECTED NOT DETECTED   Klebsiella oxytoca NOT DETECTED NOT DETECTED   Klebsiella pneumoniae NOT DETECTED NOT DETECTED   Proteus species NOT DETECTED NOT DETECTED   Salmonella species NOT DETECTED NOT DETECTED   Serratia marcescens NOT DETECTED NOT DETECTED   Haemophilus influenzae NOT DETECTED NOT DETECTED   Neisseria meningitidis NOT DETECTED NOT DETECTED   Pseudomonas aeruginosa NOT DETECTED NOT DETECTED   Stenotrophomonas maltophilia NOT DETECTED NOT DETECTED    Candida albicans NOT DETECTED NOT DETECTED   Candida auris NOT DETECTED NOT DETECTED   Candida glabrata NOT DETECTED NOT DETECTED   Candida krusei NOT DETECTED NOT DETECTED   Candida parapsilosis NOT DETECTED NOT DETECTED   Candida tropicalis NOT DETECTED NOT DETECTED   Cryptococcus neoformans/gattii NOT DETECTED NOT DETECTED   CTX-M ESBL NOT DETECTED NOT DETECTED   Carbapenem resistance IMP NOT DETECTED NOT DETECTED   Carbapenem resistance KPC NOT DETECTED NOT DETECTED   Carbapenem resistance NDM NOT DETECTED NOT DETECTED   Carbapenem resist OXA 48 LIKE NOT DETECTED NOT DETECTED   Carbapenem resistance VIM NOT DETECTED NOT DETECTED    Mosetta Anis 05/16/2023  5:47 PM

## 2023-05-16 NOTE — Progress Notes (Signed)
   05/16/23 2300  BiPAP/CPAP/SIPAP  $ Non-Invasive Ventilator  Non-Invasive Vent Subsequent  BiPAP/CPAP/SIPAP Pt Type Adult  BiPAP/CPAP/SIPAP Resmed  Mask Type Nasal mask  Mask Size Medium  EPAP 4 cmH2O  FiO2 (%) 36 %  Flow Rate 4 lpm  Patient Home Machine No  Patient Home Mask No  Patient Home Tubing No  Auto Titrate No  CPAP/SIPAP surface wiped down Yes  Device Plugged into RED Power Outlet Yes  BiPAP/CPAP /SiPAP Vitals  Pulse Rate 70  SpO2 95 %  Bilateral Breath Sounds Clear;Diminished  MEWS Score/Color  MEWS Score 0  MEWS Score Color [place] Si

## 2023-05-16 NOTE — Evaluation (Addendum)
 Physical Therapy Re-Evaluation Patient Details Name: Harold [name] MRN: [id number] DOB: May 28, 1955 Today's Date: 05/16/2023  History of Present Illness  Pt admitted to [facility] 05/12/23 for R ankle pain. X-ray showed open ankle fx. Pt recently dc after 44 days in hospital. External fixation performed 3/24. S/p R ankle arthrodesis, right subtalar arthrodesis and removal of external fixation on 05/15/2023. PMH: HTN, HLD, CAD s/p PCI, COPD, DM2, CKD stage IIIb, PVD, gout, OSA, neuropathy, COPD, a-fib, CAD s/p percutaneous coronary angioplasty, angina, obesity  Clinical Impression   Pt presents with RLE pain, impaired balance with history of falls, decreased knowledge and application of precautions, and decrased activity tolerance Pt to benefit from acute PT to address deficits. Pt requiring max +2 assist for transfer OOB to recliner, pt very fearful of falling. Pt would benefit from [facility] at [facility] post-ac[facility]. PT to progress mobility as tolerated, and will continue to follow acutely.          If plan is discharge home, recommend the following: Assistance with cooking/housework;Assist for transportation;Help with stairs or ramp for entrance;Two people to help with walking and/or transfers;Two people to help with bathing/dressing/bathroom   Can travel by private vehicle        Equipment Recommendations None recommended by PT  Recommendations for Other Services       Functional Status Assessment Patient has had a recent decline in their functional status and demonstrates the ability to make significant improvements in function in a reasonable and predictable amount of time.     Precautions / Restrictions Precautions Precautions: Fall Recall of Precautions/Restrictions: Impaired Precaution/Restrictions Comments: Watch BP Required Braces or Orthoses: Splint/Cast Splint/Cast: RLE splint Restrictions Weight Bearing Restrictions Per Provider Order: Yes RLE Weight Bearing Per Provider Order: Non  weight bearing Other Position/Activity Restrictions: pt allowed to Wb through toes to transfer only (orders states WB RLE transfers only, however PA verbally stated to pt he can only put weight through his toes)      Mobility  Bed Mobility Overal bed mobility: Needs Assistance Bed Mobility: Supine to Sit     Supine to sit: Mod assist, HOB elevated, Used rails     General bed mobility comments: Pt able to walk Bil LEs off bed assist to pull to sit, and scoot EOB    Transfers Overall transfer level: Needs assistance Equipment used: 2 person hand held assist Transfers: Bed to chair/wheelchair/BSC   Stand pivot transfers: Max assist, +2 physical assistance, +2 safety/equipment, From elevated surface         General transfer comment: Attempted SPT w/ RW, pt became fearful during transfer, requested to sit. Successful SPT with +2 hand held assist with physical assist for rise, hip translation with bed pad, and steadying.    Ambulation/Gait                  Stairs            Wheelchair Mobility     Tilt Bed    Modified Rankin (Stroke Patients Only)       Balance Overall balance assessment: Needs assistance Sitting-balance support: Bilateral upper extremity supported, Feet supported Sitting balance-Leahy Scale: Fair     Standing balance support: Bilateral upper extremity supported, During functional activity, Reliant on assistive device for balance Standing balance-Leahy Scale: Zero Standing balance comment: reliant on UE support                             Pertinent  Vitals/Pain Pain Assessment Pain Assessment: Faces Faces Pain Scale: Hurts even more Pain Location: R foot Pain Descriptors / Indicators: Discomfort, Grimacing, Moaning Pain Intervention(s): Limited activity within patient's tolerance, Monitored during session, Repositioned    Home Living Family/patient expects to be discharged to:: Private residence Living Arrangements:  Spouse/significant other Available Help at Discharge: Family Type of Home: House Home Access: Stairs to enter Entrance Stairs-Rails: None Entrance Stairs-Number of Steps: 3   Home Layout: One level Home Equipment: Pharmacist, hospital (2 wheels);Transport chair;Grab bars - toilet;Grab bars - tub/shower;BSC/3in1 Additional Comments: Pt reports spouse works from home and can assist as needed    Prior Function Prior Level of Function : History of Falls (last six months);Needs assist               ADLs Comments: amy in-house RN comes at least 1x/week for 2-3 hours to change bandage and washing. has been getting HHPT as well     Extremity/Trunk Assessment   Upper Extremity Assessment Upper Extremity Assessment: Defer to OT evaluation    Lower Extremity Assessment Lower Extremity Assessment: RLE deficits/detail;Generalized weakness RLE Deficits / Details: anticipated post-operative weakness; able to perform quad set, limited ROM heel slide given pain, LAQ lacking 10 deg knee extension    Cervical / Trunk Assessment Cervical / Trunk Assessment: Kyphotic Cervical / Trunk Exceptions: body habitus  Communication   Communication Communication: No apparent difficulties    Cognition Arousal: Alert Behavior During Therapy: WFL for tasks assessed/performed, Anxious   PT - Cognitive impairments: Safety/Judgement                       PT - Cognition Comments: cues for sequencing, fear of falling in standing Following commands: Intact Following commands impaired: Follows one step commands with increased time     Cueing Cueing Techniques: Verbal cues, Tactile cues     General Comments General comments (skin integrity, edema, etc.): VSS on RA    Exercises     Assessment/Plan    PT Assessment Patient needs continued PT services  PT Problem List Decreased strength;Decreased range of motion;Decreased activity tolerance;Decreased balance;Decreased  mobility;Decreased coordination;Decreased cognition;Decreased knowledge of use of DME;Decreased safety awareness;Decreased knowledge of precautions;Cardiopulmonary status limiting activity;Obesity       PT Treatment Interventions DME instruction;Gait training;Functional mobility training;Therapeutic activities;Therapeutic exercise;Balance training;Neuromuscular re-education;Patient/family education    PT Goals (Current goals can be found in the Care Plan section)  Acute Rehab PT Goals Patient Stated Goal: Return home confidently PT Goal Formulation: With patient Time For Goal Achievement: 05/27/23 Potential to Achieve Goals: Good    Frequency Min 3X/week     Co-evaluation   Reason for Co-Treatment: For patient/therapist safety;To address functional/ADL transfers PT goals addressed during session: Mobility/safety with mobility;Balance;Strengthening/ROM OT goals addressed during session: ADL's and self-care;Strengthening/ROM       AM-PAC PT "6 Clicks" Mobility  Outcome Measure Help needed turning from your back to your side while in a flat bed without using bedrails?: A Lot Help needed moving from lying on your back to sitting on the side of a flat bed without using bedrails?: A Lot Help needed moving to and from a bed to a chair (including a wheelchair)?: Total Help needed standing up from a chair using your arms (e.g., wheelchair or bedside chair)?: Total Help needed to walk in hospital room?: Total Help needed climbing 3-5 steps with a railing? : Total 6 Click Score: 8    End of Session Equipment Utilized During Treatment:  Gait belt Activity Tolerance: Patient limited by fatigue;Patient limited by pain Patient left: in bed;with call bell/phone within reach;with bed alarm set Nurse Communication: Mobility status;Need for lift equipment (spoke with mobility specialist about stedy for back to bed) PT Visit Diagnosis: Other abnormalities of gait and mobility (R26.89);Muscle  weakness (generalized) (M62.81);Repeated falls (R29.6)    Time: 7846-9629 PT Time Calculation (min) (ACUTE ONLY): 28 min   Charges:   PT Evaluation $PT Re-evaluation: 1 Re-eval   PT General Charges $$ ACUTE PT VISIT: 1 Visit         Marye Round, PT DPT Acute Rehabilitation Services Secure Chat Preferred  Office [phone]   Truddie Coco 05/16/2023, 12:35 PM

## 2023-05-16 NOTE — Plan of Care (Signed)
 Patient required rapid response due to change in LOC, clammy skin, slurred speech. Vital signs WNL except temp, which was recorded at 101.3.   GBG WNL.  After assessment by rapid response, patient returned to baseline LOC A/0 x4, no slurred speech but delayed.  VS monitored per MEWS protocol.  See new orders.   Problem: Education: Goal: Knowledge of General Education information will improve Description: Including pain rating scale, medication(s)/side effects and non-pharmacologic comfort measures Outcome: Progressing   Problem: Coping: Goal: Level of anxiety will decrease Outcome: Progressing   Problem: Pain Managment: Goal: General experience of comfort will improve and/or be controlled Outcome: Progressing   Problem: Safety: Goal: Ability to remain free from injury will improve Outcome: Progressing

## 2023-05-16 NOTE — Progress Notes (Addendum)
   05/15/23 1045  Spiritual Encounters  Type of Visit Initial  Care provided to: Patient  Conversation partners present during encounter Nurse  Reason for visit Code  OnCall Visit Yes   Chaplain responded to Rapid Response call. Upon arrival chaplain found medical team providing care to Pt and no support persons present.  No spiritual care intervention needed at this time.  Chaplain services remain available by Spiritual Consult or for emergent cases, paging [phone]  Chaplain Raelene Bott, MDiv [email or web address] [phone]

## 2023-05-16 NOTE — Progress Notes (Addendum)
 Orthopaedic Trauma Progress Note  SUBJECTIVE: Doing okay this morning.  Notes pain in the right heel but otherwise denies pain through the remainder of the foot or ankle.  Tolerating splint well.  Having some increased right rib pain.  Denies any chest pain, SOB. No nausea/vomiting. No other complaints.   OBJECTIVE:  Vitals:   05/16/23 0525 05/16/23 0737  BP: (!) 137/55 (!) 131/54  Pulse: (!) 58 62  Resp: 18 18  Temp: 97.6 F (36.4 C) 98.6 F (37 C)  SpO2: 100% 100%    General: Sitting in bed, no acute distress.  Pleasant and cooperative Respiratory: No increased work of breathing.  Right lower extremity: Well-padded, well-fitting short leg splint in place.  Decree sensation of the toes due to baseline neuropathy.  Nontender above splint.  Endorses sensation to the proximal tibia, knee, thigh.  Able to wiggle toes. + DP pulse  IMAGING: Stable post op imaging.   LABS:  Results for orders placed or performed during the hospital encounter of 05/12/23 (from the past 24 hours)  Glucose, capillary     Status: Abnormal   Collection Time: 05/15/23 10:41 AM  Result Value Ref Range   Glucose-Capillary 200 (H) 70 - 99 mg/dL   Comment 1 Notify RN   Glucose, capillary     Status: Abnormal   Collection Time: 05/15/23 11:51 AM  Result Value Ref Range   Glucose-Capillary 191 (H) 70 - 99 mg/dL  Glucose, capillary     Status: Abnormal   Collection Time: 05/15/23  4:18 PM  Result Value Ref Range   Glucose-Capillary 175 (H) 70 - 99 mg/dL  Glucose, capillary     Status: Abnormal   Collection Time: 05/15/23  9:30 PM  Result Value Ref Range   Glucose-Capillary 248 (H) 70 - 99 mg/dL   Comment 1 Notify RN    Comment 2 Document in Chart   Glucose, capillary     Status: Abnormal   Collection Time: 05/15/23 10:46 PM  Result Value Ref Range   Glucose-Capillary 211 (H) 70 - 99 mg/dL  Blood gas, arterial     Status: Abnormal   Collection Time: 05/15/23 11:09 PM  Result Value Ref Range   pH,  Arterial 7.44 7.35 - 7.45   pCO2 arterial 31 (L) 32 - 48 mmHg   pO2, Arterial 104 83 - 108 mmHg   Bicarbonate 21.1 20.0 - 28.0 mmol/L   Acid-base deficit 2.1 (H) 0.0 - 2.0 mmol/L   O2 Saturation 99.2 %   Patient temperature 37.0    Allens test (pass/fail) PASS PASS  Lactic acid, plasma     Status: None   Collection Time: 05/16/23 12:09 AM  Result Value Ref Range   Lactic Acid, Venous 1.0 0.5 - 1.9 mmol/L  Culture, blood (Routine X 2) w Reflex to ID Panel     Status: None (Preliminary result)   Collection Time: 05/16/23  2:06 AM   Specimen: BLOOD  Result Value Ref Range   Specimen Description BLOOD BLOOD RIGHT ARM    Special Requests      BOTTLES DRAWN AEROBIC AND ANAEROBIC Blood Culture results may not be optimal due to an inadequate volume of blood received in culture bottles   Culture      NO GROWTH < 12 HOURS Performed at [facility], [address] [place], [place] [address]    Report Status PENDING   Culture, blood (Routine X 2) w Reflex to ID Panel     Status: None (  Preliminary result)   Collection Time: 05/16/23  2:08 AM   Specimen: BLOOD  Result Value Ref Range   Specimen Description BLOOD BLOOD RIGHT ARM    Special Requests      BOTTLES DRAWN AEROBIC AND ANAEROBIC Blood Culture adequate volume   Culture      NO GROWTH < 12 HOURS Performed at [facility], [address] [place], [place] [address]    Report Status PENDING   Basic metabolic panel     Status: Abnormal   Collection Time: 05/16/23  5:20 AM  Result Value Ref Range   Sodium 131 (L) 135 - 145 mmol/L   Potassium 4.0 3.5 - 5.1 mmol/L   Chloride 98 98 - 111 mmol/L   CO2 25 22 - 32 mmol/L   Glucose, Bld 224 (H) 70 - 99 mg/dL   BUN 29 (H) 8 - 23 mg/dL   Creatinine, Ser 6.57 (H) 0.61 - 1.24 mg/dL   Calcium 7.8 (L) 8.9 - 10.3 mg/dL   GFR, Estimated 20 (L) >60 mL/min   Anion gap 8 5 - 15  Magnesium     Status: None   Collection Time: 05/16/23  5:20 AM  Result Value Ref Range   Magnesium 1.8  1.7 - 2.4 mg/dL  CBC     Status: Abnormal   Collection Time: 05/16/23  5:20 AM  Result Value Ref Range   WBC 7.1 4.0 - 10.5 K/uL   RBC 2.80 (L) 4.22 - 5.81 MIL/uL   Hemoglobin 8.3 (L) 13.0 - 17.0 g/dL   HCT 84.6 (L) 96.2 - 95.2 %   MCV 88.9 80.0 - 100.0 fL   MCH 29.6 26.0 - 34.0 pg   MCHC 33.3 30.0 - 36.0 g/dL   RDW 84.1 32.4 - 40.1 %   Platelets 177 150 - 400 K/uL   nRBC 0.0 0.0 - 0.2 %  Procalcitonin     Status: None   Collection Time: 05/16/23  5:20 AM  Result Value Ref Range   Procalcitonin 1.51 ng/mL  Glucose, capillary     Status: Abnormal   Collection Time: 05/16/23  6:42 AM  Result Value Ref Range   Glucose-Capillary 194 (H) 70 - 99 mg/dL   *Note: Due to a large number of results and/or encounters for the requested time period, some results have not been displayed. A complete set of results can be found in Results Review.    ASSESSMENT: Harold [name] is a 68 y.o. male, 1 Day Post-Op s/p IRRIGATION AND DEBRIDEMENT WITH EXTERNAL FIXATION RIGHT TRIMALLEOLAR ANKLE FRACTURE  CV/Blood loss: Acute blood loss anemia, Hgb 8.3 this morning.  Received 1 unit PRBCs per primary team on 05/15/2023.  Hemodynamically stable  PLAN: Weightbearing: WB RLE for transfers only. Do not want any walker ambulation RLE ROM: Maintain splint okay for knee motion as tolerated Incisional and dressing care: Dressings left intact until follow-up  Showering: Bed bath Orthopedic device(s): Splint RLE Pain management:  1. Tylenol 650 mg q 6 hours PRN 2. Robaxin 500 mg q 6 hours PRN 3. Oxycodone 5 mg q 4 hours PRN 4. Dilaudid 0.5-1 mg q 4 hours PRN 5. Neurontin 300 mg nightly VTE prophylaxis: Okay to restart Plavix and Eliquis today.  SCDs ID:  Ancef 2gm post op Foley/Lines:  No foley, KVO IVFs Impediments to Fracture Healing: Vitamin D level 28 when checked February 2025, continue supplementation Dispo: PT/OT evaluation ongoing.  TOC following and working on SNF placement.  Patient okay for  discharge  from ortho standpoint  Discharge recommendations: -Oxycodone, Robaxin, Neurontin, Tylenol as needed for pain control -Aspirin 325 mg daily x 30 days for DVT prophylaxis -Continue on home dose vitamin D supplementation  Follow - up plan: 2 weeks after discharge for splint/suture removal, wound check, and repeat x-rays   Contact information:  Truitt Merle MD, Thyra Breed PA-C. After hours and holidays please check [email or web address] for group call information for [facility]   Thompson Caul, PA-C [phone] (office) [email or web address]

## 2023-05-16 NOTE — Progress Notes (Addendum)
 Occupational Therapy Treatment Patient Details Name: Harold [name] MRN: [id number] DOB: 1955/12/07 Today's Date: 05/16/2023   History of present illness Pt admitted to [facility] 05/12/23 for R ankle pain s/p repeated falls at home. X-ray showed open ankle fx. Pt recently dc after 44 days in hospital. External fixation performed 3/24. NWB RLE.  PMH: HTN, HLD, CAD s/p PCI, COPD, DM2, CKD stage IIIb, PVD, gout, OSA, neuropathy, COPD, a-fib, CAD s/p percutaneous coronary angioplasty, angina, obesity   OT comments  Pt progressing well towards goals. Pt able to progress to complete SPT with max +2 hand held assist. Pt continues to be limited by pain, decreased strength, and balance. Pt  would benefit from continued inpatient follow up therapy . Will continue to follow acutely.       If plan is discharge home, recommend the following:  A lot of help with bathing/dressing/bathroom;Assistance with cooking/housework;Help with stairs or ramp for entrance;Assist for transportation;Two people to help with walking and/or transfers   Equipment Recommendations  Other (comment) (Defer to next venue)    Recommendations for Other Services      Precautions / Restrictions Precautions Precautions: Fall Recall of Precautions/Restrictions: Impaired Precaution/Restrictions Comments: Watch BP Required Braces or Orthoses: Splint/Cast Splint/Cast: RLE splint Restrictions Weight Bearing Restrictions Per Provider Order: Yes RLE Weight Bearing Per Provider Order: Non weight bearing Other Position/Activity Restrictions: pt allowed to Wb through toes to transfer only       Mobility Bed Mobility Overal bed mobility: Needs Assistance Bed Mobility: Supine to Sit     Supine to sit: Mod assist, HOB elevated, Used rails     General bed mobility comments: Pt able to walk Bil LEs off bed assist to pull to sit, and scoot EOB    Transfers Overall transfer level: Needs assistance Equipment used: 2 person hand  held assist Transfers: Bed to chair/wheelchair/BSC   Stand pivot transfers: Max assist, +2 physical assistance, +2 safety/equipment, From elevated surface         General transfer comment: Attempted SPT w/ RW, pt became fearful during transfer, requested to sit. Successful SPT with +2 hand held assist     Balance Overall balance assessment: Needs assistance Sitting-balance support: Bilateral upper extremity supported, Feet supported Sitting balance-Leahy Scale: Fair     Standing balance support: Bilateral upper extremity supported, During functional activity, Reliant on assistive device for balance Standing balance-Leahy Scale: Zero Standing balance comment: reliant on UE support                           ADL either performed or assessed with clinical judgement   ADL Overall ADL's : Needs assistance/impaired                     Lower Body Dressing: Total assistance Lower Body Dressing Details (indicate cue type and reason): Pt received assistance at baseline Toilet Transfer: Maximal assistance;+2 for safety/equipment;+2 for physical assistance Toilet Transfer Details (indicate cue type and reason): Simulated in room +2 hand held assist SPT         Functional mobility during ADLs: Maximal assistance;+2 for physical assistance;+2 for safety/equipment      Extremity/Trunk Assessment Upper Extremity Assessment Upper Extremity Assessment: Overall WFL for tasks assessed   Lower Extremity Assessment Lower Extremity Assessment: Defer to PT evaluation        Vision   Vision Assessment?: No apparent visual deficits   Perception     Praxis  Communication Communication Communication: No apparent difficulties   Cognition Arousal: Alert Behavior During Therapy: WFL for tasks assessed/performed Cognition: No apparent impairments       Following commands: Intact Following commands impaired: Follows one step commands with increased time       Cueing   Cueing Techniques: Verbal cues, Tactile cues  Exercises      Shoulder Instructions       General Comments VSS on RA    Pertinent Vitals/ Pain       Pain Assessment Pain Assessment: Faces Faces Pain Scale: Hurts even more Pain Location: R foot Pain Descriptors / Indicators: Discomfort, Grimacing, Moaning Pain Intervention(s): Monitored during session, Repositioned   Frequency  Min 2X/week        Progress Toward Goals  OT Goals(current goals can now be found in the care plan section)  Progress towards OT goals: Progressing toward goals  Acute Rehab OT Goals Patient Stated Goal: None stated OT Goal Formulation: With patient Time For Goal Achievement: 05/27/23 Potential to Achieve Goals: Good ADL Goals Pt Will Perform Lower Body Dressing: with mod assist;with adaptive equipment;sitting/lateral leans Pt Will Transfer to Toilet: stand pivot transfer;with transfer board;bedside commode;grab bars;with mod assist Pt Will Perform Toileting - Clothing Manipulation and hygiene: with mod assist;sitting/lateral leans  Plan      Co-evaluation    PT/OT/SLP Co-Evaluation/Treatment: Yes Reason for Co-Treatment: For patient/therapist safety;To address functional/ADL transfers   OT goals addressed during session: ADL's and self-care;Strengthening/ROM      AM-PAC OT "6 Clicks" Daily Activity     Outcome Measure   Help from another person eating meals?: None Help from another person taking care of personal grooming?: A Little Help from another person toileting, which includes using toliet, bedpan, or urinal?: Total Help from another person bathing (including washing, rinsing, drying)?: A Lot Help from another person to put on and taking off regular upper body clothing?: A Little Help from another person to put on and taking off regular lower body clothing?: Total 6 Click Score: 14    End of Session Equipment Utilized During Treatment: Gait belt;Rolling walker (2  wheels)  OT Visit Diagnosis: Unsteadiness on feet (R26.81);Other abnormalities of gait and mobility (R26.89);Muscle weakness (generalized) (M62.81) Pain - Right/Left: Right Pain - part of body: Ankle and joints of foot   Activity Tolerance Patient tolerated treatment well   Patient Left in chair;with call bell/phone within reach;with chair alarm set   Nurse Communication Mobility status        Time: 1610-9604 OT Time Calculation (min): 27 min  Charges: OT General Charges $OT Visit: 1 Visit OT Treatments $Self Care/Home Management : 8-22 mins  Ivor Messier, OT  Acute Rehabilitation Services Office [phone] Secure chat preferred   Marilynne Drivers 05/16/2023, 10:23 AM

## 2023-05-16 NOTE — Progress Notes (Signed)
    Patient Name: LENNEX PIETILA           DOB: 09-Aug-1955  MRN: [id number]      Admission Date: 05/12/2023  Attending Provider: Marinda Elk, MD  Primary Diagnosis: Open right ankle fracture, sequela   Level of care: Telemetry Surgical    CROSS COVER NOTE   Date of Service   05/16/2023   ABISHAI VIEGAS, 68 y.o. male, was admitted on 05/12/2023 for Open right ankle fracture, sequela.    HPI/Events of Note   Notified of rapid response event. Bedside RN concerned with patient being "drowsy and sweating." He received gabapentin earlier tonight.  CBG normal.   Vital stable, but now febrile.  Axillary temperature 101.3 F.  Currently on Ancef for surgical prophylaxis. Per bedside RN -although drowsy, patient wakes up well with verbal stimulation.  Orientation level at baseline, has has some delayed response/speech.      Interventions/ Plan   Treat fever with Tylenol  Obtain UA, BC, lactic acid, CBC. Blood gas, EKG. Placed patient on CPAP (baseline nightly use)        Anthoney Harada, DNP, [employer] [facility]

## 2023-05-16 NOTE — Progress Notes (Signed)
 [facility] PROGRESS NOTE    Progress Note  Harold [name]  ZOX:[id number] DOB: 06/04/1955 DOA: 05/12/2023 PCP: [name], MD     Brief Narrative:   Harold [name] is an 68 y.o. male past medical history of morbid obesity, obstructive sleep apnea diabetes mellitus type 2 on A1c, CAD with a history of stent, atrial fibrillation on Eliquis, carotid stenosis, chronic kidney disease stage IIIb, diabetic peripheral neuropathy, hypothyroidism recently discharged on 05/10/2018 for comes in for right ankle pain BTK surgery was consulted underwent external fixation of the right ankle with closed reduction, plan to repeat surgery on 05/15/2023  Assessment/Plan:   Open right ankle fracture, sequela: On November 2024 underwent ORIF for ankle fracture with revision in December 2024 for broken hardware. 04/28/2023 underwent hardware removal due to infection. 04/02/2023 underwent I&D by Dr. Lajoyce Corners. Now readmitted on 05/12/2023 for external fixation of the right ankle on the same day. Status post right ankle arthrodesis, right subtalar arthrodesis and removal of external fixation on 05/15/2023 Continue narcotics, anticoagulation and weightbearing per Ortho. PT evaluated the patient, TOC helping with disposition.  New onset fever: No leukocytosis, check pro-calcitonin and CXR. Urinalysis is pending. LA unremarkable. Cont to monitor fever curve.  Acute postoperative blood loss anemia/chronic kidney disease IV: Status post 2 unit packed red blood cells hemoglobin this morning 8.3 Keep hemoglobin above 8 due to coronary artery disease. CBC in the morning.  Chronic renal disease, stage IV (HCC) Renal function is stable, his baseline is around 2.6-3.6. He required HD during last admission.  Hypomagnesemia: repleted orally magnesium 1.8. Continue oral supplementation.  Paroxysmal atrial fibrillation: Currently on amiodarone, Eliquis on hold for surgical intervention. Orthopedic  surgery to dictate when to restart anticoagulation.  CAD, LAD/CFX DES 04/28/12- staged RCA DES 04/29/12 after pt declined CABG, cath 05/14/13  stable CAD medical therapy: Holding Plavix denies chest pain or shortness of breath.  Struct of sleep apnea: Continue CPAP.  Anxiety and depression:  continue Cymbalta.  Chronic hypotension: Continue midodrine.  OSA on CPAP Continue CPAP.  Acquired hypothyroidism Continue Synthroid.  Uncontrolled type 2 diabetes mellitus with hyperglycemia, with long-term current use of insulin: Blood glucose significantly elevated. Last A1c of 7. Cont. long-acting insulin, continue sliding scale insulin.  Chronic gout: Continue colchicine.  Morbid obesity: Noted  RN Pressure Injury Documentation: Pressure Injury 04/17/23 Ankle Right;Anterior Deep Tissue Pressure Injury - Purple or maroon localized area of discolored intact skin or blood-filled blister due to damage of underlying soft tissue from pressure and/or shear. (Active)  04/17/23 2040  Location: Ankle  Location Orientation: Right;Anterior  Staging: Deep Tissue Pressure Injury - Purple or maroon localized area of discolored intact skin or blood-filled blister due to damage of underlying soft tissue from pressure and/or shear.  Wound Description (Comments):   Present on Admission: Yes  Dressing Type Gauze (Comment);Compression wrap 05/14/23 2120      DVT prophylaxis: Lovenox Family Communication:none Status is: Inpatient Remains inpatient appropriate because: Open right ankle fracture    Code Status:     Code Status Orders  (From admission, onward)           Start     Ordered   05/12/23 1338  Full code  Continuous       Question:  By:  Answer:  Other   05/12/23 1343           Code Status History     Date Active Date Inactive Code Status Order ID Comments User Context   03/27/2023  2243 05/10/2023 1618 Full Code [id number]  Angie Fava, DO ED   01/09/2023 0555  01/14/2023 1612 Full Code [id number]  Dolly Rias, MD ED   01/02/2023 0828 01/07/2023 1852 Full Code [id number]  Clydie Braun, MD ED   01/02/2023 0813 01/02/2023 0828 Full Code [id number]  Clydie Braun, MD ED   11/06/2022 1411 11/07/2022 2159 Full Code [id number]  Iran Ouch, MD Inpatient   07/26/2020 1434 07/27/2020 0140 Full Code [id number]  Coletta Memos, MD Inpatient   05/14/2013 1827 05/15/2013 1657 Full Code [id number]  Lennette Bihari, MD Inpatient   05/13/2013 2244 05/14/2013 1827 Full Code [id number]  Barrett, Joline Salt, PA-C Inpatient      Advance Directive Documentation    Flowsheet Row Most Recent Value  Type of Advance Directive Healthcare Power of Attorney, Living will  Pre-existing out of facility DNR order (yellow form or pink MOST form) --  "MOST" Form in Place? --         IV Access:   Peripheral IV   Procedures and diagnostic studies:   DG Ankle Right Port Result Date: 05/15/2023 CLINICAL DATA:  Postop. EXAM: PORTABLE RIGHT ANKLE - 2 VIEW COMPARISON:  CT 05/13/2023 FINDINGS: Fusion of the distal tibia, talus and calcaneus with nail and screw fixation. Ghost tracks from prior hardware. External fixator has been removed. Unchanged alignment of medial malleolar fracture. Overlying splint material limits osseous and soft tissue fine detail. IMPRESSION: Fusion of the distal tibia, talus and calcaneus with nail and screw fixation. Electronically Signed   By: Narda Rutherford M.D.   On: 05/15/2023 13:17   DG Ankle Complete Right Result Date: 05/15/2023 CLINICAL DATA:  Ankle surgery.  Ankle arthrodesis. EXAM: RIGHT ANKLE - COMPLETE 3+ VIEW COMPARISON:  CT 05/13/2023 FINDINGS: Nine fluoroscopic spot views of the right ankle submitted from the operating room. The external fixator has been removed. Calcaneal, talar, tibial effusion with nail and locking screws. Fluoroscopy time 88.7 seconds. Dose 1.3 mGy IMPRESSION: Intraoperative fluoroscopy during ankle arthrodesis.  Electronically Signed   By: Narda Rutherford M.D.   On: 05/15/2023 11:16   DG C-Arm 1-60 Min-No Report Result Date: 05/15/2023 Fluoroscopy was utilized by the requesting physician.  No radiographic interpretation.     Medical Consultants:   None.   Subjective:    Harold [name] has not had a bowel movement.  Objective:    Vitals:   05/15/23 2255 05/15/23 2335 05/16/23 0003 05/16/23 0525  BP:   (!) 120/52 (!) 137/55  Pulse:  82 74 (!) 58  Resp:    18  Temp: (!) 101.3 F (38.5 C)  (!) 100.9 F (38.3 C) 97.6 F (36.4 C)  TempSrc: Axillary  Axillary Oral  SpO2:  98% 100% 100%  Weight:      Height:       SpO2: 100 % O2 Flow Rate (L/min): 2 L/min FiO2 (%): 36 %   Intake/Output Summary (Last 24 hours) at 05/16/2023 0701 Last data filed at 05/15/2023 2046 Gross per 24 hour  Intake 972 ml  Output 750 ml  Net 222 ml   Filed Weights   05/12/23 0940 05/14/23 0428 05/15/23 0500  Weight: 136.1 kg 135.2 kg 129.2 kg    Exam: General exam: In no acute distress. Respiratory system: Good air movement and clear to auscultation. Cardiovascular system: S1 & S2 heard, RRR. No JVD. Gastrointestinal system: Abdomen is nondistended, soft and nontender.  Extremities: No pedal edema. Skin: No rashes, lesions or ulcers Psychiatry: Judgement and  insight appear normal. Mood & affect appropriate.  Data Reviewed:    Labs: Basic Metabolic Panel: Recent Labs  Lab 05/10/23 0358 05/12/23 0947 05/13/23 0648 05/14/23 0627 05/15/23 0543 05/16/23 0520  NA 140 138 135 132* 132* 131*  K 4.6 3.8 4.4 4.3 3.7 4.0  CL 102 101 101 100 97* 98  CO2 25 25 24 24 24 25   GLUCOSE 99 229* 252* 217* 192* 224*  BUN 30* 29* 30* 28* 28* 29*  CREATININE 4.07* 3.66* 3.68* 3.42* 3.07* 3.23*  CALCIUM 8.8* 8.7* 8.2* 8.3* 8.5* 7.8*  MG  --   --  1.4* 1.7 1.8 1.8  PHOS 4.6  --  4.0  --   --   --    GFR Estimated Creatinine Clearance: 30.4 mL/min (A) (by C-G formula based on SCr of 3.23 mg/dL  (H)). Liver Function Tests: Recent Labs  Lab 05/10/23 0358 05/13/23 0648  AST  --  11*  ALT  --  9  ALKPHOS  --  73  BILITOT  --  0.6  PROT  --  5.7*  ALBUMIN 2.6* 2.6*   No results for input(s): "LIPASE", "AMYLASE" in the last 168 hours. No results for input(s): "AMMONIA" in the last 168 hours. Coagulation profile Recent Labs  Lab 05/12/23 0947  INR 1.5*   COVID-19 Labs  No results for input(s): "DDIMER", "FERRITIN", "LDH", "CRP" in the last 72 hours.  Lab Results  Component Value Date   SARSCOV2NAA NEGATIVE 03/27/2023   SARSCOV2NAA NEGATIVE 07/24/2020   SARSCOV2NAA NEGATIVE 10/09/2019    CBC: Recent Labs  Lab 05/12/23 0947 05/13/23 0648 05/14/23 0627 05/15/23 0543 05/16/23 0520  WBC 6.9 7.1 6.8 5.1 7.1  HGB 9.0* 7.0* 7.9* 7.6* 8.3*  HCT 27.5* 21.7* 23.9* 23.0* 24.9*  MCV 92.3 91.9 90.5 89.1 88.9  PLT 162 135* 122* 141* 177   Cardiac Enzymes: No results for input(s): "CKTOTAL", "CKMB", "CKMBINDEX", "TROPONINI" in the last 168 hours. BNP (last 3 results) No results for input(s): "PROBNP" in the last 8760 hours. CBG: Recent Labs  Lab 05/15/23 1151 05/15/23 1618 05/15/23 2130 05/15/23 2246 05/16/23 0642  GLUCAP 191* 175* 248* 211* 194*   D-Dimer: No results for input(s): "DDIMER" in the last 72 hours. Hgb A1c: No results for input(s): "HGBA1C" in the last 72 hours. Lipid Profile: No results for input(s): "CHOL", "HDL", "LDLCALC", "TRIG", "CHOLHDL", "LDLDIRECT" in the last 72 hours. Thyroid function studies: No results for input(s): "TSH", "T4TOTAL", "T3FREE", "THYROIDAB" in the last 72 hours.  Invalid input(s): "FREET3" Anemia work up: No results for input(s): "VITAMINB12", "FOLATE", "FERRITIN", "TIBC", "IRON", "RETICCTPCT" in the last 72 hours. Sepsis Labs: Recent Labs  Lab 05/13/23 0648 05/14/23 0627 05/15/23 0543 05/16/23 0009 05/16/23 0520  WBC 7.1 6.8 5.1  --  7.1  LATICACIDVEN  --   --   --  1.0  --    Microbiology Recent Results  (from the past 240 hours)  Surgical pcr screen     Status: None   Collection Time: 05/12/23  5:04 PM   Specimen: Nasal Mucosa; Nasal Swab  Result Value Ref Range Status   MRSA, PCR NEGATIVE NEGATIVE Final   Staphylococcus aureus NEGATIVE NEGATIVE Final    Comment: (NOTE) The Xpert SA Assay (FDA approved for NASAL specimens in patients 61 years of age and older), is one component of a comprehensive surveillance program. It is not intended to diagnose infection nor to guide or monitor treatment. Performed at [facility], [address] [place],  [place] [address]      Medications:    sodium chloride   Intravenous Once   allopurinol  50 mg Oral Daily   amiodarone  200 mg Oral BID   atorvastatin  40 mg Oral Daily   B-complex with vitamin C  1 tablet Oral Daily   Chlorhexidine Gluconate Cloth  6 each Topical Daily   colchicine  0.6 mg Oral QODAY   vitamin B-12  1,000 mcg Oral Daily   docusate sodium  100 mg Oral BID   DULoxetine  60 mg Oral Daily   enoxaparin (LOVENOX) injection  40 mg Subcutaneous Q24H   ezetimibe  10 mg Oral Daily   feeding supplement  237 mL Oral BID BM   ferrous sulfate  325 mg Oral Q breakfast   gabapentin  300 mg Oral QHS   insulin aspart  0-5 Units Subcutaneous QHS   insulin aspart  0-9 Units Subcutaneous TID WC   insulin aspart  2 Units Subcutaneous TID WC   insulin glargine-yfgn  5 Units Subcutaneous QHS   levothyroxine  88 mcg Oral Q0600   midodrine  5 mg Oral TID WC   sodium chloride flush  10-40 mL Intracatheter Q12H   sodium chloride flush  3 mL Intravenous Q12H   sodium chloride flush  3-10 mL Intravenous Q12H   Vitamin D (Ergocalciferol)  50,000 Units Oral Q7 days   Continuous Infusions:   ceFAZolin (ANCEF) IV 2 g (05/16/23 0017)      LOS: 4 days   Marinda Elk  [facility]  05/16/2023, 7:01 AM

## 2023-05-16 NOTE — TOC Progression Note (Addendum)
 Transition of Care [facility]) - Progression Note    Patient Details  Name: Harold [name] MRN: [id number] Date of Birth: 03/14/1955  Transition of Care [facility] CM/SW Contact  Lorri Frederick, LCSW Phone Number: 05/16/2023, 11:23 AM  Clinical Narrative:   CSW spoke with [facility], updated her on readmission.  She will see if new admission would potentially make LTAC auth more likely.  Message from [facility]: would need 10 days copays upfront for potential bed offer.  1120: CSW spoke with Jillyn Hidden, updated him on current bed offers, [facility] need for copays up front, LTAC.  Harold [name] will be in later to discuss further.   1230: message from [facility].  She does think LTAC is realistic option with pt readmission to acute care.  Can move forward with auth request.   1300: Pt [name] updated on the above.  Harold [name] informed CSW that he and Harold [name] are no longer "together" in a marital sense, have been separated technically for some time, but not divorced.  However, Harold [name] also said that he continues to be pt caregiver and POA and they continue to share their home and this is unchanged.   Expected Discharge Plan: Skilled Nursing Facility Barriers to Discharge: Continued Medical Work up  Expected Discharge Plan and Services In-house Referral: Clinical Social Work     Living arrangements for the past 2 months: Single Family Home                                       Social Determinants of Health (SDOH) Interventions SDOH Screenings   Food Insecurity: No Food Insecurity (05/12/2023)  Housing: Low Risk  (05/12/2023)  Transportation Needs: No Transportation Needs (05/12/2023)  Utilities: Not At Risk (05/12/2023)  Social Connections: Moderately Integrated (05/12/2023)  Tobacco Use: Medium Risk (05/15/2023)    Readmission Risk Interventions     No data to display

## 2023-05-17 DIAGNOSIS — R509 Fever, unspecified: Secondary | ICD-10-CM

## 2023-05-17 DIAGNOSIS — S82891S Other fracture of right lower leg, sequela: Secondary | ICD-10-CM | POA: Diagnosis not present

## 2023-05-17 DIAGNOSIS — N184 Chronic kidney disease, stage 4 (severe): Secondary | ICD-10-CM | POA: Diagnosis not present

## 2023-05-17 DIAGNOSIS — R7881 Bacteremia: Secondary | ICD-10-CM

## 2023-05-17 DIAGNOSIS — I480 Paroxysmal atrial fibrillation: Secondary | ICD-10-CM | POA: Diagnosis not present

## 2023-05-17 DIAGNOSIS — B9689 Other specified bacterial agents as the cause of diseases classified elsewhere: Secondary | ICD-10-CM | POA: Diagnosis not present

## 2023-05-17 DIAGNOSIS — E1165 Type 2 diabetes mellitus with hyperglycemia: Secondary | ICD-10-CM

## 2023-05-17 DIAGNOSIS — Z794 Long term (current) use of insulin: Secondary | ICD-10-CM

## 2023-05-17 LAB — CBC
HCT: 23.4 % — ABNORMAL LOW (ref 39.0–52.0)
Hemoglobin: 8 g/dL — ABNORMAL LOW (ref 13.0–17.0)
MCH: 30.2 pg (ref 26.0–34.0)
MCHC: 34.2 g/dL (ref 30.0–36.0)
MCV: 88.3 fL (ref 80.0–100.0)
Platelets: 176 10*3/uL (ref 150–400)
RBC: 2.65 MIL/uL — ABNORMAL LOW (ref 4.22–5.81)
RDW: 15.3 % (ref 11.5–15.5)
WBC: 7.5 10*3/uL (ref 4.0–10.5)
nRBC: 0 % (ref 0.0–0.2)

## 2023-05-17 LAB — BASIC METABOLIC PANEL WITH GFR
Anion gap: 12 (ref 5–15)
BUN: 32 mg/dL — ABNORMAL HIGH (ref 8–23)
CO2: 24 mmol/L (ref 22–32)
Calcium: 8.5 mg/dL — ABNORMAL LOW (ref 8.9–10.3)
Chloride: 94 mmol/L — ABNORMAL LOW (ref 98–111)
Creatinine, Ser: 3.1 mg/dL — ABNORMAL HIGH (ref 0.61–1.24)
GFR, Estimated: 21 mL/min — ABNORMAL LOW (ref >60)
Glucose, Bld: 194 mg/dL — ABNORMAL HIGH (ref 70–99)
Potassium: 3.7 mmol/L (ref 3.5–5.1)
Sodium: 130 mmol/L — ABNORMAL LOW (ref 135–145)

## 2023-05-17 LAB — GLUCOSE, CAPILLARY
Glucose-Capillary: 165 mg/dL — ABNORMAL HIGH (ref 70–99)
Glucose-Capillary: 184 mg/dL — ABNORMAL HIGH (ref 70–99)
Glucose-Capillary: 165 mg/dL — ABNORMAL HIGH (ref 70–99)
Glucose-Capillary: 152 mg/dL — ABNORMAL HIGH (ref 70–99)
Glucose-Capillary: 157 mg/dL — ABNORMAL HIGH (ref 70–99)

## 2023-05-17 MED ORDER — MINERAL OIL RE ENEM: 1.0000 | ENEMA | Freq: Once | RECTAL | Status: DC

## 2023-05-17 MED ORDER — CIPROFLOXACIN IN D5W 400 MG/200ML IV SOLN
400.0000 mg | Freq: Two times a day (BID) | INTRAVENOUS | Status: DC
  Administered 2023-05-17 – 2023-05-19 (×4): 400 mg via INTRAVENOUS
  Filled 2023-05-17 (×4): qty 200

## 2023-05-17 MED ORDER — DOCUSATE SODIUM 283 MG RE ENEM
1.0000 | ENEMA | Freq: Once | RECTAL | Status: AC
  Administered 2023-05-17: 283 mg via RECTAL
  Filled 2023-05-17: qty 1

## 2023-05-17 MED ORDER — GERHARDT'S BUTT CREAM
TOPICAL_CREAM | Freq: Two times a day (BID) | CUTANEOUS | Status: DC
  Filled 2023-05-17: qty 60

## 2023-05-17 MED ORDER — MINERAL OIL RE ENEM
1.0000 | ENEMA | Freq: Once | RECTAL | Status: DC | PRN
  Filled 2023-05-17: qty 1

## 2023-05-17 NOTE — Progress Notes (Addendum)
 [facility] PROGRESS NOTE    Progress Note  FONTAINE HEHL  ZOX:[id number] DOB: [birth year] DOA: 05/12/2023 PCP: [name], MD     Brief Narrative:   Harold [name] is an 68 y.o. male past medical history of morbid obesity, obstructive sleep apnea diabetes mellitus type 2 on A1c, CAD with a history of stent, atrial fibrillation on Eliquis, carotid stenosis, chronic kidney disease stage IIIb, diabetic peripheral neuropathy, hypothyroidism recently discharged on 05/10/2018 for comes in for right ankle pain BTK surgery was consulted underwent external fixation of the right ankle with closed reduction, plan to repeat surgery on 05/15/2023  Assessment/Plan:   Open right ankle fracture, sequela: On November 2024 underwent ORIF for ankle fracture with revision in December 2024 for broken hardware. 04/28/2023 underwent hardware removal due to infection. 04/02/2023 underwent I&D by Dr. Lajoyce Corners. Now readmitted on 05/12/2023 for external fixation of the right ankle on the same day. Status post right ankle arthrodesis, right subtalar arthrodesis and removal of external fixation on 05/15/2023 Continue narcotics, anticoagulation and weightbearing per Ortho. PT evaluated the patient, TOC helping with disposition.  They relate [facility] is probably an option.  Bacteremia Enterobacter species: Tmax of 99.5 no leukocytosis, procalcitonin mildly elevated. Chest x-ray showed no evidence of infiltrates. Blood culture ID reflex panel showed Enterobacterales. His antibiotics were changed to IV  cefepime. He is defervescing  Acute postoperative blood loss anemia/chronic kidney disease IV: Status post 2 unit packed red blood cells hemoglobin this morning 8.3 Keep hemoglobin above 8 due to coronary artery disease. CBC in the morning.  Chronic renal disease, stage IV (HCC) Renal function is stable, his baseline is around 2.6-3.6. He required HD during last admission. His renal function was  around his baseline, basic metabolic panels pending this morning.  Hypomagnesemia: repleted orally magnesium 1.8. Continue oral supplementation.  Paroxysmal atrial fibrillation: Currently on amiodarone, and Eliquis Orthopedic surgery to dictate when to restart anticoagulation. Try to keep potassium greater than 4 magnesium greater than 2.  No events on telemetry.  CAD, LAD/CFX DES 04/28/12- staged RCA DES 04/29/12 after pt declined CABG, cath 05/14/13  stable CAD medical therapy: Continue Plavix denies chest pain or shortness of breath.  Struct of sleep apnea: Continue CPAP.  Anxiety and depression:  continue Cymbalta.  Chronic hypotension: Continue midodrine.  OSA on CPAP Continue CPAP.  Acquired hypothyroidism Continue Synthroid.  Uncontrolled type 2 diabetes mellitus with hyperglycemia, with long-term current use of insulin: Blood glucose significantly elevated. Last A1c of 7. Cont. long-acting insulin, continue sliding scale insulin.  Chronic gout: Continue colchicine.  Morbid obesity: Noted  RN Pressure Injury Documentation: Pressure Injury 04/17/23 Ankle Right;Anterior Deep Tissue Pressure Injury - Purple or maroon localized area of discolored intact skin or blood-filled blister due to damage of underlying soft tissue from pressure and/or shear. (Active)  04/17/23 2040  Location: Ankle  Location Orientation: Right;Anterior  Staging: Deep Tissue Pressure Injury - Purple or maroon localized area of discolored intact skin or blood-filled blister due to damage of underlying soft tissue from pressure and/or shear.  Wound Description (Comments):   Present on Admission: Yes  Dressing Type Gauze (Comment);Compression wrap 05/14/23 2120      DVT prophylaxis: Lovenox Family Communication:none Status is: Inpatient Remains inpatient appropriate because: Open right ankle fracture    Code Status:     Code Status Orders  (From admission, onward)            Start     Ordered   05/12/23 1338  Full code  Continuous       Question:  By:  Answer:  Other   05/12/23 1343           Code Status History     Date Active Date Inactive Code Status Order ID Comments User Context   03/27/2023 2243 05/10/2023 1618 Full Code [id number]  Angie Fava, DO ED   01/09/2023 0555 01/14/2023 1612 Full Code [id number]  Dolly Rias, MD ED   01/02/2023 0828 01/07/2023 1852 Full Code [id number]  Clydie Braun, MD ED   01/02/2023 0813 01/02/2023 0828 Full Code [id number]  Clydie Braun, MD ED   11/06/2022 1411 11/07/2022 2159 Full Code [id number]  Iran Ouch, MD Inpatient   07/26/2020 1434 07/27/2020 0140 Full Code [id number]  Coletta Memos, MD Inpatient   05/14/2013 1827 05/15/2013 1657 Full Code [id number]  Lennette Bihari, MD Inpatient   05/13/2013 2244 05/14/2013 1827 Full Code [id number]  Barrett, Joline Salt, PA-C Inpatient      Advance Directive Documentation    Flowsheet Row Most Recent Value  Type of Advance Directive Healthcare Power of Attorney, Living will  Pre-existing out of facility DNR order (yellow form or pink MOST form) --  "MOST" Form in Place? --         IV Access:   Peripheral IV   Procedures and diagnostic studies:   DG CHEST PORT 1 VIEW Result Date: 05/16/2023 CLINICAL DATA:  Fever.  History of open ankle fracture. EXAM: PORTABLE CHEST 1 VIEW COMPARISON:  Radiographs 05/13/2023 and 04/17/2023. FINDINGS: 0806 hours. Left IJ hemodialysis catheter is unchanged at the level of the superior cavoatrial junction. The lungs are clear. There is no pleural effusion or pneumothorax. Telemetry leads overlie the chest. The bones appear unchanged. IMPRESSION: No evidence of acute cardiopulmonary process. Stable left IJ hemodialysis catheter. Electronically Signed   By: Carey Bullocks M.D.   On: 05/16/2023 12:02   DG Ankle Right Port Result Date: 05/15/2023 CLINICAL DATA:  Postop. EXAM: PORTABLE RIGHT ANKLE - 2 VIEW COMPARISON:  CT  05/13/2023 FINDINGS: Fusion of the distal tibia, talus and calcaneus with nail and screw fixation. Ghost tracks from prior hardware. External fixator has been removed. Unchanged alignment of medial malleolar fracture. Overlying splint material limits osseous and soft tissue fine detail. IMPRESSION: Fusion of the distal tibia, talus and calcaneus with nail and screw fixation. Electronically Signed   By: Narda Rutherford M.D.   On: 05/15/2023 13:17   DG Ankle Complete Right Result Date: 05/15/2023 CLINICAL DATA:  Ankle surgery.  Ankle arthrodesis. EXAM: RIGHT ANKLE - COMPLETE 3+ VIEW COMPARISON:  CT 05/13/2023 FINDINGS: Nine fluoroscopic spot views of the right ankle submitted from the operating room. The external fixator has been removed. Calcaneal, talar, tibial effusion with nail and locking screws. Fluoroscopy time 88.7 seconds. Dose 1.3 mGy IMPRESSION: Intraoperative fluoroscopy during ankle arthrodesis. Electronically Signed   By: Narda Rutherford M.D.   On: 05/15/2023 11:16   DG C-Arm 1-60 Min-No Report Result Date: 05/15/2023 Fluoroscopy was utilized by the requesting physician.  No radiographic interpretation.     Medical Consultants:   None.   Subjective:    Cordero Surette Benton-Elliot had a bowel movement.  Objective:    Vitals:   05/16/23 1909 05/16/23 1911 05/16/23 2300 05/17/23 0517  BP: (!) 139/58 (!) 198/58  (!) 150/58  Pulse: 71 71 70 66  Resp: 18     Temp: 99.5 F (37.5 C) 99.5 F (37.5 C)  97.7 F (36.5 C)  TempSrc:  Oral  Oral  SpO2: 95% 95% 95% 95%  Weight:      Height:       SpO2: 95 % O2 Flow Rate (L/min): 2 L/min FiO2 (%): 36 %   Intake/Output Summary (Last 24 hours) at 05/17/2023 0830 Last data filed at 05/17/2023 0542 Gross per 24 hour  Intake 1280 ml  Output 700 ml  Net 580 ml   Filed Weights   05/12/23 0940 05/14/23 0428 05/15/23 0500  Weight: 136.1 kg 135.2 kg 129.2 kg    Exam: General exam: In no acute distress. Respiratory system: Good air  movement and clear to auscultation. Cardiovascular system: S1 & S2 heard, RRR. No JVD. Gastrointestinal system: Abdomen is nondistended, soft and nontender.  Extremities: No pedal edema. Skin: No rashes, lesions or ulcers Psychiatry: Judgement and insight appear normal. Mood & affect appropriate. Data Reviewed:    Labs: Basic Metabolic Panel: Recent Labs  Lab 05/12/23 0947 05/13/23 0648 05/14/23 0627 05/15/23 0543 05/16/23 0520  NA 138 135 132* 132* 131*  K 3.8 4.4 4.3 3.7 4.0  CL 101 101 100 97* 98  CO2 25 24 24 24 25   GLUCOSE 229* 252* 217* 192* 224*  BUN 29* 30* 28* 28* 29*  CREATININE 3.66* 3.68* 3.42* 3.07* 3.23*  CALCIUM 8.7* 8.2* 8.3* 8.5* 7.8*  MG  --  1.4* 1.7 1.8 1.8  PHOS  --  4.0  --   --   --    GFR Estimated Creatinine Clearance: 30.4 mL/min (A) (by C-G formula based on SCr of 3.23 mg/dL (H)). Liver Function Tests: Recent Labs  Lab 05/13/23 0648  AST 11*  ALT 9  ALKPHOS 73  BILITOT 0.6  PROT 5.7*  ALBUMIN 2.6*   No results for input(s): "LIPASE", "AMYLASE" in the last 168 hours. No results for input(s): "AMMONIA" in the last 168 hours. Coagulation profile Recent Labs  Lab 05/12/23 0947  INR 1.5*   COVID-19 Labs  No results for input(s): "DDIMER", "FERRITIN", "LDH", "CRP" in the last 72 hours.  Lab Results  Component Value Date   SARSCOV2NAA NEGATIVE 03/27/2023   SARSCOV2NAA NEGATIVE 07/24/2020   SARSCOV2NAA NEGATIVE 10/09/2019    CBC: Recent Labs  Lab 05/13/23 0648 05/14/23 0627 05/15/23 0543 05/16/23 0520 05/17/23 0441  WBC 7.1 6.8 5.1 7.1 7.5  HGB 7.0* 7.9* 7.6* 8.3* 8.0*  HCT 21.7* 23.9* 23.0* 24.9* 23.4*  MCV 91.9 90.5 89.1 88.9 88.3  PLT 135* 122* 141* 177 176   Cardiac Enzymes: No results for input(s): "CKTOTAL", "CKMB", "CKMBINDEX", "TROPONINI" in the last 168 hours. BNP (last 3 results) No results for input(s): "PROBNP" in the last 8760 hours. CBG: Recent Labs  Lab 05/16/23 0642 05/16/23 1133 05/16/23 1610  05/16/23 2104 05/17/23 0525  GLUCAP 194* 194* 191* 171* 165*   D-Dimer: No results for input(s): "DDIMER" in the last 72 hours. Hgb A1c: No results for input(s): "HGBA1C" in the last 72 hours. Lipid Profile: No results for input(s): "CHOL", "HDL", "LDLCALC", "TRIG", "CHOLHDL", "LDLDIRECT" in the last 72 hours. Thyroid function studies: No results for input(s): "TSH", "T4TOTAL", "T3FREE", "THYROIDAB" in the last 72 hours.  Invalid input(s): "FREET3" Anemia work up: No results for input(s): "VITAMINB12", "FOLATE", "FERRITIN", "TIBC", "IRON", "RETICCTPCT" in the last 72 hours. Sepsis Labs: Recent Labs  Lab 05/14/23 0627 05/15/23 0543 05/16/23 0009 05/16/23 0520 05/17/23 0441  PROCALCITON  --   --   --  1.51  --   WBC 6.8 5.1  --  7.1 7.5  LATICACIDVEN  --   --  1.0  --   --    Microbiology Recent Results (from the past 240 hours)  Surgical pcr screen     Status: None   Collection Time: 05/12/23  5:04 PM   Specimen: Nasal Mucosa; Nasal Swab  Result Value Ref Range Status   MRSA, PCR NEGATIVE NEGATIVE Final   Staphylococcus aureus NEGATIVE NEGATIVE Final    Comment: (NOTE) The Xpert SA Assay (FDA approved for NASAL specimens in patients 55 years of age and older), is one component of a comprehensive surveillance program. It is not intended to diagnose infection nor to guide or monitor treatment. Performed at [facility], [address] [place], [place] [address]   Culture, blood (Routine X 2) w Reflex to ID Panel     Status: None (Preliminary result)   Collection Time: 05/16/23  2:06 AM   Specimen: BLOOD RIGHT ARM  Result Value Ref Range Status   Specimen Description BLOOD RIGHT ARM  Final   Special Requests   Final    BOTTLES DRAWN AEROBIC AND ANAEROBIC Blood Culture results may not be optimal due to an inadequate volume of blood received in culture bottles   Culture  Setup Time   Final    GRAM NEGATIVE RODS ANAEROBIC BOTTLE ONLY CRITICAL RESULT CALLED TO,  READ BACK BY AND VERIFIED WITH: Neil Crouch [id number] AT 1741 BY EC Performed at [facility], [address], [place], [place] [address]    Culture GRAM NEGATIVE RODS  Final   Report Status PENDING  Incomplete  Blood Culture ID Panel (Reflexed)     Status: Abnormal   Collection Time: 05/16/23  2:06 AM  Result Value Ref Range Status   Enterococcus faecalis NOT DETECTED NOT DETECTED Final   Enterococcus Faecium NOT DETECTED NOT DETECTED Final   Listeria monocytogenes NOT DETECTED NOT DETECTED Final   Staphylococcus species NOT DETECTED NOT DETECTED Final   Staphylococcus aureus (BCID) NOT DETECTED NOT DETECTED Final   Staphylococcus epidermidis NOT DETECTED NOT DETECTED Final   Staphylococcus lugdunensis NOT DETECTED NOT DETECTED Final   Streptococcus species NOT DETECTED NOT DETECTED Final   Streptococcus agalactiae NOT DETECTED NOT DETECTED Final   Streptococcus pneumoniae NOT DETECTED NOT DETECTED Final   Streptococcus pyogenes NOT DETECTED NOT DETECTED Final   A.calcoaceticus-baumannii NOT DETECTED NOT DETECTED Final   Bacteroides fragilis NOT DETECTED NOT DETECTED Final   Enterobacterales DETECTED (A) NOT DETECTED Final    Comment: Enterobacterales represent a large order of gram negative bacteria, not a single organism. CRITICAL RESULT CALLED TO, READ BACK BY AND VERIFIED WITH: PHARMD [name] BITONTI [id number] AT 1741 BY EC    Enterobacter cloacae complex DETECTED (A) NOT DETECTED Final    Comment: CRITICAL RESULT CALLED TO, READ BACK BY AND VERIFIED WITH: PHARMD [name] BITONTI [id number] AT 1741 BY EC    Escherichia coli NOT DETECTED NOT DETECTED Final   Klebsiella aerogenes NOT DETECTED NOT DETECTED Final   Klebsiella oxytoca NOT DETECTED NOT DETECTED Final   Klebsiella pneumoniae NOT DETECTED NOT DETECTED Final   Proteus species NOT DETECTED NOT DETECTED Final   Salmonella species NOT DETECTED NOT DETECTED Final   Serratia marcescens NOT DETECTED NOT DETECTED  Final   Haemophilus influenzae NOT DETECTED NOT DETECTED Final   Neisseria meningitidis NOT DETECTED NOT DETECTED Final   Pseudomonas aeruginosa NOT DETECTED NOT DETECTED Final   Stenotrophomonas maltophilia NOT DETECTED NOT DETECTED Final   Candida albicans NOT DETECTED NOT DETECTED Final   Candida auris NOT  DETECTED NOT DETECTED Final   Candida glabrata NOT DETECTED NOT DETECTED Final   Candida krusei NOT DETECTED NOT DETECTED Final   Candida parapsilosis NOT DETECTED NOT DETECTED Final   Candida tropicalis NOT DETECTED NOT DETECTED Final   Cryptococcus neoformans/gattii NOT DETECTED NOT DETECTED Final   CTX-M ESBL NOT DETECTED NOT DETECTED Final   Carbapenem resistance IMP NOT DETECTED NOT DETECTED Final   Carbapenem resistance KPC NOT DETECTED NOT DETECTED Final   Carbapenem resistance NDM NOT DETECTED NOT DETECTED Final   Carbapenem resist OXA 48 LIKE NOT DETECTED NOT DETECTED Final   Carbapenem resistance VIM NOT DETECTED NOT DETECTED Final    Comment: Performed at [facility], [address] [place], [place] [address]  Culture, blood (Routine X 2) w Reflex to ID Panel     Status: None (Preliminary result)   Collection Time: 05/16/23  2:08 AM   Specimen: BLOOD  Result Value Ref Range Status   Specimen Description BLOOD BLOOD RIGHT ARM  Final   Special Requests   Final    BOTTLES DRAWN AEROBIC AND ANAEROBIC Blood Culture adequate volume   Culture   Final    NO GROWTH 1 DAY Performed at [facility], [address] [place], [place] [address]    Report Status PENDING  Incomplete     Medications:    sodium chloride   Intravenous Once   allopurinol  50 mg Oral Daily   amiodarone  200 mg Oral BID   apixaban  5 mg Oral BID   atorvastatin  40 mg Oral Daily   B-complex with vitamin C  1 tablet Oral Daily   Chlorhexidine Gluconate Cloth  6 each Topical Daily   clopidogrel  75 mg Oral q morning   colchicine  0.6 mg Oral QODAY   vitamin B-12  1,000 mcg Oral Daily    docusate sodium  100 mg Oral BID   DULoxetine  60 mg Oral Daily   ezetimibe  10 mg Oral Daily   feeding supplement  237 mL Oral BID BM   ferrous sulfate  325 mg Oral Q breakfast   gabapentin  300 mg Oral QHS   insulin aspart  0-5 Units Subcutaneous QHS   insulin aspart  0-9 Units Subcutaneous TID WC   insulin aspart  2 Units Subcutaneous TID WC   insulin glargine-yfgn  5 Units Subcutaneous QHS   levothyroxine  88 mcg Oral Q0600   midodrine  5 mg Oral TID WC   polyethylene glycol  17 g Oral BID   sodium chloride flush  3 mL Intravenous Q12H   sodium chloride flush  3-10 mL Intravenous Q12H   Vitamin D (Ergocalciferol)  50,000 Units Oral Q7 days   Continuous Infusions:  ceFEPime (MAXIPIME) IV 2 g (05/17/23 0515)      LOS: 5 days   Marinda Elk  [facility]  05/17/2023, 8:30 AM

## 2023-05-17 NOTE — Consult Note (Addendum)
 [facility]    Date of Admission:  05/12/2023     Reason for Consult: bacteremia    Referring Provider: David Stall     Lines:  Left chest hd cath  Abx: cefepime        Assessment: 68 yo male ckd3, qtc prolongation, previous right distal lower extremity orif associated infection s/p I&D (last procedure 2/12) and all hardware removal (cx enterobacter cloacae and mrse placed on dapto/cefepime but stopped 2/28 due to aki on ckd concerning for ain requiring iHD with plan to continue outpatient (although hasn't needed x2weeks pta),  discharged 3/22 but readmitted 3/24 for a ground level fall resulting in open right ankle fracture through the area of previous infection, s/p initial ex-fix and since definitive orif by 3/27, course complicated by sepsis and enterobacter bacteremia  Patient has low grade temp within 3 days of admission suspect he has bacteremia through the open fracture. Enterobacter colonization on skin probably. He did have enterobacter/mrse om previously  Concerning for also hd line seeding  He has been off iHD for the last 2 weeks prior to this admission  It was unclear if he had ain and from what but presumed cefepime.  He has qtc issue as well  Cipro would be the next best choice given much lower tendency to worsen qtc. Would avoid beta-lactam given prior possible ain  3/28 bcx 1 of 2 set gnr (enterobacter by bcid) 3/24 mrsa nares pcr negative  Plan: Stop cefepime Start cipro  renally dosed 400 mg iv q12 for a couple days then 500 mg daily there after Telemetry Daily EKG for 3 days to monitor qtc Duration of antibiotics anticipate 4 weeks given recent surgery around time of bacteremia. but whatever it is will need close monitoring for involvement of the hardware Please discuss with renal to remove the hd catheter in setting gram negative bacteremia Standard isolation precaution Discussed with primary  team     ------------------------------------------------ Principal Problem:   Open right ankle fracture, sequela Active Problems:   Essential hypertension   Anxiety and depression   CAD, LAD/CFX DES 04/28/12- staged RCA DES 04/29/12 after pt declined CABG, cath 05/14/13 stable CAD medical therapy   OSA on CPAP   Chronic renal disease, stage IV (HCC)   Gastroesophageal reflux disease without esophagitis   Paroxysmal atrial fibrillation (HCC)   Uncontrolled type 2 diabetes mellitus with hyperglycemia, with long-term current use of insulin (HCC)   Acquired hypothyroidism    HPI: Harold [name] is a 68 y.o. male recent right ankle hardware related om s/p hardware removal and I&D and abx by 2/28, complicated by aki requiring iHD ?ain from abx (cefepime/dapto) but unclear, admitted after an open ankle fracture again through similar right ankle site, now course complicated by sepsis and enterobacter bacteremia  Reviewed chart/discussed with patient  Initial open fracture noted by ortho with bone coming through the skin  3/24 ex-fix placement right ankle 3/27 right ankle arthrodesis, subtalar arthrodesis; removal ex-fix. Reviewed op note -- no specimen sent  "Fluoroscopic imaging showed the unstable nature of his injury.  A guidewire for the hindfoot fusion nail was placed at the appropriate position on the lateral and AP views.  It was advanced through the calcaneus and through the subtalar joint and passed the tibial talar joint.  The ankle was held in a neutral dorsiflexion and neutral valgus alignment.  I then cut down on the guidewire and proceeded to  ream through the calcaneus and through the subtalar joint and through the tibial talar joint.  I then passed a ball-tipped guidewire down the center of the canal.  I then proceeded to ream from 9 mm to 11.5 mm.  I placed a 10 x 200 mm nail.   Using the targeting arm I then placed a posterior to anterior screw through the calcaneus  directed to the cuboid.  I then placed a another screw going from the calcaneus across the subtalar joint into the talus.  Excellent fixation was obtained.  I then performed perfect circle technique to place 2 proximal interlocking screws.  Fluoroscopic imaging was obtained which showed adequate alignment.  The incisions were irrigated and closed with 3-0 nylon suture.  Sterile dressings were applied and a short leg splint was then placed.  The patient was then awoke from anesthesia and taken to the PACU in stable condition."   There was no observed cellulitis/soft tissue infection of the right ankle on 3/24 or 3/27 operative finding  Review of the flow sheet show tmax 100.1 on 3/25 evening, and 101.3 on 3/27 night There were no leukocytosis associated with these fever or during admission  Patient received perioperative cefazolin antibiotics on 3/24 and 3/27; he did get a dose of vancomycin on 3/24 as well  Mrsa nares pcr screen on admission was negative  3/28 culture @ 230 am grew 1 of 2 set GNR (bcid enterobacter cloacae); cx returned within 12 hrs. Started on cefepime    He also said he felt fine no decline in health since discharge in terms of b sx/f/c  Again no focal pain at this time outside of controlled pain right ankle      Family History  Adopted: Yes  Family history unknown: Yes    Social History   Tobacco Use   Smoking status: Former    Current packs/day: 0.00    Average packs/day: 1 pack/day for 42.0 years (42.0 ttl pk-yrs)    Types: Cigarettes    Start date: 03/22/1961    Quit date: 03/23/2003    Years since quitting: 20.1   Smokeless tobacco: Never   Tobacco comments:    Former smoker 02/19/22  Vaping Use   Vaping status: Never Used  Substance Use Topics   Alcohol use: No    Alcohol/week: 0.0 standard drinks of alcohol   Drug use: No    Allergies  Allergen Reactions   Doxycycline Hives and Rash   Septra [Bactrim] Itching   Penicillins Rash    Allergy  to All cillin drugs  Ancef given 9/24 with no obvious reaction   Metformin And Related Diarrhea and Nausea Only   Other Nausea And Vomiting    General Anesthesia - sometimes causes nausea and vomiting * OK to use scopolamine patch     Sulfamethoxazole-Trimethoprim Other (See Comments)   Wellbutrin [Bupropion]     Mood Changes    Review of Systems: ROS All Other ROS was negative, except mentioned above   Past Medical History:  Diagnosis Date   Anemia    Anginal pain (HCC) 04/2013   Cardiac cath showed patent stents with distal LAD, circumflex-OM and RCA disease in small vessels.   Anxiety    Atrial fibrillation (HCC) 12/2021   CAD S/P percutaneous coronary angioplasty 2003, 04/2012   status post PCI to LAD, circumflex-OM 2, RCA   Carotid artery occlusion    Chronic renal insufficiency, stage II (mild)    Chronic venous insufficiency  varicosities, no reflux; dopplers 04/14/12- valvular insufficiency in the R and L GSV   Complication of anesthesia    COPD, mild (HCC)    Depression    situaltional    Diabetes mellitus    Diabetic neuropathy (HCC) 08/24/2019   Dyslipidemia associated with type 2 diabetes mellitus (HCC)    Fall at home, initial encounter 01/02/2023   GERD (gastroesophageal reflux disease)    History of cardioversion 12/2021   at [facility]   HTN (hypertension)    Hypothyroidism    Neuromuscular disorder (HCC)    neuropathy in feet   Neuropathy    notably improved following PCI with improved cardiac function   Obesity    Obesity, Class II, BMI 35.0-39.9, with comorbidity (see actual BMI)    BMI 39; wgt loss efforts in place; seeing Dietician   Open ankle fracture 01/02/2023   Orthostatic hypotension    PONV (postoperative nausea and vomiting)    "[employer] Works"   Pulmonary hypertension (HCC) 04/2012   PA pressure   Sleep apnea    uses nightly   Vitamin D deficiency    per facility notes       Scheduled Meds:  sodium chloride   Intravenous  Once   allopurinol  50 mg Oral Daily   amiodarone  200 mg Oral BID   apixaban  5 mg Oral BID   atorvastatin  40 mg Oral Daily   B-complex with vitamin C  1 tablet Oral Daily   Chlorhexidine Gluconate Cloth  6 each Topical Daily   clopidogrel  75 mg Oral q morning   colchicine  0.6 mg Oral QODAY   vitamin B-12  1,000 mcg Oral Daily   docusate sodium  100 mg Oral BID   DULoxetine  60 mg Oral Daily   ezetimibe  10 mg Oral Daily   feeding supplement  237 mL Oral BID BM   ferrous sulfate  325 mg Oral Q breakfast   gabapentin  300 mg Oral QHS   insulin aspart  0-5 Units Subcutaneous QHS   insulin aspart  0-9 Units Subcutaneous TID WC   insulin aspart  2 Units Subcutaneous TID WC   insulin glargine-yfgn  5 Units Subcutaneous QHS   levothyroxine  88 mcg Oral Q0600   midodrine  5 mg Oral TID WC   polyethylene glycol  17 g Oral BID   sodium chloride flush  3 mL Intravenous Q12H   sodium chloride flush  3-10 mL Intravenous Q12H   Vitamin D (Ergocalciferol)  50,000 Units Oral Q7 days   Continuous Infusions:  ceFEPime (MAXIPIME) IV 2 g (05/17/23 0515)   PRN Meds:.acetaminophen **OR** acetaminophen, hydrALAZINE, HYDROmorphone (DILAUDID) injection, methocarbamol **OR** methocarbamol (ROBAXIN) injection, metoCLOPramide **OR** metoCLOPramide (REGLAN) injection, nitroGLYCERIN, oxyCODONE, polyethylene glycol, sodium chloride flush   OBJECTIVE: Blood pressure (!) 143/71, pulse 66, temperature (!) 97.1 F (36.2 C), resp. rate 17, height 6\' 1"  (1.854 m), weight 129.2 kg, SpO2 95%.  Physical Exam  General/constitutional: no distress, pleasant HEENT: Normocephalic, PER, Conj Clear, EOMI, Oropharynx clear Neck supple CV: rrr no mrg Lungs: clear to auscultation, normal respiratory effort Abd: Soft, Nontender Ext: no edema Skin: No Rash Neuro: nonfocal MSK: rle in bulky dressing/cast   Central line presence: left chest hd site no purulence/tenderness/redness   Lab Results Lab Results   Component Value Date   WBC 7.5 05/17/2023   HGB 8.0 (L) 05/17/2023   HCT 23.4 (L) 05/17/2023   MCV 88.3 05/17/2023   PLT 176  05/17/2023    Lab Results  Component Value Date   CREATININE 3.23 (H) 05/16/2023   BUN 29 (H) 05/16/2023   NA 131 (L) 05/16/2023   K 4.0 05/16/2023   CL 98 05/16/2023   CO2 25 05/16/2023    Lab Results  Component Value Date   ALT 9 05/13/2023   AST 11 (L) 05/13/2023   GGT 28 02/20/2018   ALKPHOS 73 05/13/2023   BILITOT 0.6 05/13/2023      Microbiology: Recent Results (from the past 240 hours)  Surgical pcr screen     Status: None   Collection Time: 05/12/23  5:04 PM   Specimen: Nasal Mucosa; Nasal Swab  Result Value Ref Range Status   MRSA, PCR NEGATIVE NEGATIVE Final   Staphylococcus aureus NEGATIVE NEGATIVE Final    Comment: (NOTE) The Xpert SA Assay (FDA approved for NASAL specimens in patients 44 years of age and older), is one component of a comprehensive surveillance program. It is not intended to diagnose infection nor to guide or monitor treatment. Performed at [facility], [address] [place], [place] [address]   Culture, blood (Routine X 2) w Reflex to ID Panel     Status: None (Preliminary result)   Collection Time: 05/16/23  2:06 AM   Specimen: BLOOD RIGHT ARM  Result Value Ref Range Status   Specimen Description BLOOD RIGHT ARM  Final   Special Requests   Final    BOTTLES DRAWN AEROBIC AND ANAEROBIC Blood Culture results may not be optimal due to an inadequate volume of blood received in culture bottles   Culture  Setup Time   Final    GRAM NEGATIVE RODS ANAEROBIC BOTTLE ONLY CRITICAL RESULT CALLED TO, READ BACK BY AND VERIFIED WITH: Neil Crouch [id number] AT 1741 BY EC Performed at [facility], [address], [place], [place] [address]    Culture GRAM NEGATIVE RODS  Final   Report Status PENDING  Incomplete  Blood Culture ID Panel (Reflexed)     Status: Abnormal   Collection Time: 05/16/23   2:06 AM  Result Value Ref Range Status   Enterococcus faecalis NOT DETECTED NOT DETECTED Final   Enterococcus Faecium NOT DETECTED NOT DETECTED Final   Listeria monocytogenes NOT DETECTED NOT DETECTED Final   Staphylococcus species NOT DETECTED NOT DETECTED Final   Staphylococcus aureus (BCID) NOT DETECTED NOT DETECTED Final   Staphylococcus epidermidis NOT DETECTED NOT DETECTED Final   Staphylococcus lugdunensis NOT DETECTED NOT DETECTED Final   Streptococcus species NOT DETECTED NOT DETECTED Final   Streptococcus agalactiae NOT DETECTED NOT DETECTED Final   Streptococcus pneumoniae NOT DETECTED NOT DETECTED Final   Streptococcus pyogenes NOT DETECTED NOT DETECTED Final   A.calcoaceticus-baumannii NOT DETECTED NOT DETECTED Final   Bacteroides fragilis NOT DETECTED NOT DETECTED Final   Enterobacterales DETECTED (A) NOT DETECTED Final    Comment: Enterobacterales represent a large order of gram negative bacteria, not a single organism. CRITICAL RESULT CALLED TO, READ BACK BY AND VERIFIED WITH: PHARMD [name] BITONTI [id number] AT 1741 BY EC    Enterobacter cloacae complex DETECTED (A) NOT DETECTED Final    Comment: CRITICAL RESULT CALLED TO, READ BACK BY AND VERIFIED WITH: PHARMD [name] BITONTI [id number] AT 1741 BY EC    Escherichia coli NOT DETECTED NOT DETECTED Final   Klebsiella aerogenes NOT DETECTED NOT DETECTED Final   Klebsiella oxytoca NOT DETECTED NOT DETECTED Final   Klebsiella pneumoniae NOT DETECTED NOT DETECTED Final   Proteus species  NOT DETECTED NOT DETECTED Final   Salmonella species NOT DETECTED NOT DETECTED Final   Serratia marcescens NOT DETECTED NOT DETECTED Final   Haemophilus influenzae NOT DETECTED NOT DETECTED Final   Neisseria meningitidis NOT DETECTED NOT DETECTED Final   Pseudomonas aeruginosa NOT DETECTED NOT DETECTED Final   Stenotrophomonas maltophilia NOT DETECTED NOT DETECTED Final   Candida albicans NOT DETECTED NOT DETECTED Final   Candida auris NOT  DETECTED NOT DETECTED Final   Candida glabrata NOT DETECTED NOT DETECTED Final   Candida krusei NOT DETECTED NOT DETECTED Final   Candida parapsilosis NOT DETECTED NOT DETECTED Final   Candida tropicalis NOT DETECTED NOT DETECTED Final   Cryptococcus neoformans/gattii NOT DETECTED NOT DETECTED Final   CTX-M ESBL NOT DETECTED NOT DETECTED Final   Carbapenem resistance IMP NOT DETECTED NOT DETECTED Final   Carbapenem resistance KPC NOT DETECTED NOT DETECTED Final   Carbapenem resistance NDM NOT DETECTED NOT DETECTED Final   Carbapenem resist OXA 48 LIKE NOT DETECTED NOT DETECTED Final   Carbapenem resistance VIM NOT DETECTED NOT DETECTED Final    Comment: Performed at [facility], [address], [place], [place] [address]  Culture, blood (Routine X 2) w Reflex to ID Panel     Status: None (Preliminary result)   Collection Time: 05/16/23  2:08 AM   Specimen: BLOOD RIGHT ARM  Result Value Ref Range Status   Specimen Description BLOOD RIGHT ARM  Final   Special Requests   Final    BOTTLES DRAWN AEROBIC AND ANAEROBIC Blood Culture adequate volume   Culture   Final    NO GROWTH 1 DAY Performed at [facility], [address] [place], [place] [address]    Report Status PENDING  Incomplete     Serology:    Imaging: If present, new imagings (plain films, ct scans, and mri) have been personally visualized and interpreted; radiology reports have been reviewed. Decision making incorporated into the Impression / Recommendations.   3/25 ct right ankle no contrast 1. Status post external fixation of trimalleolar right ankle fracture. 2. Progressive tibiotalar joint space loss. 3. Diffuse soft tissue swelling about the ankle. There is air in the soft tissues at the medial aspect of the ankle, presumably related to prior surgery. Superimposed infection not excluded. 4. Tibialis posterior tendon is ill-defined.   3/24 xray right tib/fib FINDINGS: Initial images demonstrate  the prior fibular fracture and removed surgical hardware. Medial malleolar fragments are again noted similar to that seen on the prior exam. External fixator is then placed. Posterior malleolar fracture is noted as well.   IMPRESSION: Placement of external fixator.   3/24 right ankle xray *Comminuted and displaced fracture of the lower diaphysis of the fibula. *Age indeterminate fractures of the medial and posterior malleoli.    Raymondo Band, MD [facility] [phone] pager    05/17/2023, 10:10 AM

## 2023-05-17 NOTE — Progress Notes (Signed)
 Orthopaedic Trauma Service Progress Note  Patient ID: Harold [name] MRN: [id number] DOB/AGE: September 22, 1955 68 y.o.  Subjective:  Ortho issues stable Block still working for R ankle    ROS As above  Objective:   VITALS:   Vitals:   05/16/23 1911 05/16/23 2300 05/17/23 0517 05/17/23 0849  BP: (!) 198/58  (!) 150/58 (!) 143/71  Pulse: 71 70 66 66  Resp:    17  Temp: 99.5 F (37.5 C)  97.7 F (36.5 C) (!) 97.1 F (36.2 C)  TempSrc: Oral  Oral   SpO2: 95% 95% 95% 95%  Weight:      Height:        Estimated body mass index is 37.58 kg/m as calculated from the following:   Height as of this encounter: 6\' 1"  (1.854 m).   Weight as of this encounter: 129.2 kg.   Intake/Output      03/28 0701 03/29 0700 03/29 0701 03/30 0700   P.O. 1280    I.V. (mL/kg)     Blood     IV Piggyback     Total Intake(mL/kg) 1280 (9.9)    Urine (mL/kg/hr) 700 (0.2) 350 (0.8)   Blood     Total Output 700 350   Net +580 -350          LABS  Results for orders placed or performed during the hospital encounter of 05/12/23 (from the past 24 hours)  Glucose, capillary     Status: Abnormal   Collection Time: 05/16/23 11:33 AM  Result Value Ref Range   Glucose-Capillary 194 (H) 70 - 99 mg/dL  Glucose, capillary     Status: Abnormal   Collection Time: 05/16/23  4:10 PM  Result Value Ref Range   Glucose-Capillary 191 (H) 70 - 99 mg/dL  Glucose, capillary     Status: Abnormal   Collection Time: 05/16/23  9:04 PM  Result Value Ref Range   Glucose-Capillary 171 (H) 70 - 99 mg/dL  CBC     Status: Abnormal   Collection Time: 05/17/23  4:41 AM  Result Value Ref Range   WBC 7.5 4.0 - 10.5 K/uL   RBC 2.65 (L) 4.22 - 5.81 MIL/uL   Hemoglobin 8.0 (L) 13.0 - 17.0 g/dL   HCT 40.9 (L) 81.1 - 91.4 %   MCV 88.3 80.0 - 100.0 fL   MCH 30.2 26.0 - 34.0 pg   MCHC 34.2 30.0 - 36.0 g/dL   RDW 78.2 95.6 - 21.3 %   Platelets  176 150 - 400 K/uL   nRBC 0.0 0.0 - 0.2 %  Glucose, capillary     Status: Abnormal   Collection Time: 05/17/23  5:25 AM  Result Value Ref Range   Glucose-Capillary 165 (H) 70 - 99 mg/dL  Glucose, capillary     Status: Abnormal   Collection Time: 05/17/23  8:46 AM  Result Value Ref Range   Glucose-Capillary 184 (H) 70 - 99 mg/dL  Basic metabolic panel     Status: Abnormal   Collection Time: 05/17/23  9:17 AM  Result Value Ref Range   Sodium 130 (L) 135 - 145 mmol/L   Potassium 3.7 3.5 - 5.1 mmol/L   Chloride 94 (L) 98 - 111 mmol/L   CO2 24 22 - 32 mmol/L   Glucose, Bld 194 (H) 70 -  99 mg/dL   BUN 32 (H) 8 - 23 mg/dL   Creatinine, Ser 1.61 (H) 0.61 - 1.24 mg/dL   Calcium 8.5 (L) 8.9 - 10.3 mg/dL   GFR, Estimated 21 (L) >60 mL/min   Anion gap 12 5 - 15   *Note: Due to a large number of results and/or encounters for the requested time period, some results have not been displayed. A complete set of results can be found in Results Review.     PHYSICAL EXAM:   Gen: in bed, NAD, pleasant as always  Ext:       Right Lower Extremity Splint fitting well Dressing is clean, dry and intact  Extremity is warm  No DCT  Compartments are soft  No pain out of proportion with passive stretching of his toes or ankle  Able to move toes a little but no sensory function noted    Assessment/Plan: 2 Days Post-Op   Principal Problem:   Open right ankle fracture, sequela Active Problems:   Essential hypertension   Anxiety and depression   CAD, LAD/CFX DES 04/28/12- staged RCA DES 04/29/12 after pt declined CABG, cath 05/14/13 stable CAD medical therapy   OSA on CPAP   Chronic renal disease, stage IV (HCC)   Gastroesophageal reflux disease without esophagitis   Paroxysmal atrial fibrillation (HCC)   Uncontrolled type 2 diabetes mellitus with hyperglycemia, with long-term current use of insulin (HCC)   Acquired hypothyroidism   Anti-infectives (From admission, onward)    Start      Dose/Rate Route Frequency Ordered Stop   05/16/23 1845  ceFEPIme (MAXIPIME) 2 g in sodium chloride 0.9 % 100 mL IVPB        2 g 200 mL/hr over 30 Minutes Intravenous Every 12 hours 05/16/23 1748     05/15/23 1700  ceFAZolin (ANCEF) IVPB 2g/100 mL premix        2 g 200 mL/hr over 30 Minutes Intravenous Every 8 hours 05/15/23 1152 05/16/23 0957   05/15/23 0939  vancomycin (VANCOCIN) powder  Status:  Discontinued          As needed 05/15/23 0940 05/15/23 1029   05/15/23 0900  ceFAZolin (ANCEF) IVPB 2g/100 mL premix        2 g 200 mL/hr over 30 Minutes Intravenous  Once 05/15/23 0852 05/15/23 0923   05/15/23 0825  ceFAZolin (ANCEF) 2-4 GM/100ML-% IVPB       Note to Pharmacy: Phebe Colla N: cabinet override      05/15/23 0825 05/15/23 0928   05/13/23 0400  ceFAZolin (ANCEF) IVPB 2g/100 mL premix        2 g 200 mL/hr over 30 Minutes Intravenous Every 12 hours 05/12/23 1915 05/13/23 2326   05/12/23 1835  vancomycin (VANCOCIN) powder  Status:  Discontinued          As needed 05/12/23 1836 05/12/23 1852   05/12/23 1000  ceFAZolin (ANCEF) IVPB 2g/100 mL premix        2 g 200 mL/hr over 30 Minutes Intravenous  Once 05/12/23 0948 05/12/23 1143   05/12/23 0945  ceFAZolin (ANCEF) IVPB 1 g/50 mL premix  Status:  Discontinued        1 g 100 mL/hr over 30 Minutes Intravenous  Once 05/12/23 0943 05/12/23 0948     .  POD/HD#: 1  68 y/o male with recurrent open R trimalleolar ankle fracture dislocation   - fall -recurrent R trimalleolar ankle fracture dislocation s/p I&D and fixation with hindfoot fusion nail  WBAT  R leg for transfers only, NO AMBULATION  Splint x 2 weeks then convert to boot or SLC  Knee motion as tolerated  Ice and elevate  Therapy evals  TOC for SNF  - Pain management:  Multimodal   - ABL anemia/Hemodynamics  Monitor   - Medical issues   Per medicine   - DVT/PE prophylaxis:  Home plavix and eliquis  - ID:   Per ID   - Metabolic Bone Disease:  Continue  vitamin d   - Activity:  As above  - Impediments to fracture healing:  Open fracture   DM   Obesity   H/o R ankle infection  - Dispo:  Ortho issues stable  Ok to DC to snf when bed available  Follow up with ortho in 10-14 days    Mearl Latin, PA-C [phone] (C) 05/17/2023, 10:35 AM  [facility] [address] [place] [address] [phone] [phone] (F)    After 5pm and on the weekends please log on to Amion, go to orthopaedics and the look under the [facility] Call for the provider(s) on call. You can also call our office at [phone] and then follow the prompts to be connected to the call team.  Patient ID: Harold [name], male   DOB: Sep 09, 1955, 68 y.o.   MRN: [id number]

## 2023-05-18 DIAGNOSIS — N184 Chronic kidney disease, stage 4 (severe): Secondary | ICD-10-CM | POA: Diagnosis not present

## 2023-05-18 DIAGNOSIS — R7881 Bacteremia: Secondary | ICD-10-CM | POA: Diagnosis not present

## 2023-05-18 DIAGNOSIS — I480 Paroxysmal atrial fibrillation: Secondary | ICD-10-CM | POA: Diagnosis not present

## 2023-05-18 DIAGNOSIS — S82891S Other fracture of right lower leg, sequela: Secondary | ICD-10-CM | POA: Diagnosis not present

## 2023-05-18 LAB — CBC
HCT: 22.9 % — ABNORMAL LOW (ref 39.0–52.0)
Hemoglobin: 7.7 g/dL — ABNORMAL LOW (ref 13.0–17.0)
MCH: 29.5 pg (ref 26.0–34.0)
MCHC: 33.6 g/dL (ref 30.0–36.0)
MCV: 87.7 fL (ref 80.0–100.0)
Platelets: 181 10*3/uL (ref 150–400)
RBC: 2.61 MIL/uL — ABNORMAL LOW (ref 4.22–5.81)
RDW: 15.4 % (ref 11.5–15.5)
WBC: 6.8 10*3/uL (ref 4.0–10.5)
nRBC: 0 % (ref 0.0–0.2)

## 2023-05-18 LAB — BASIC METABOLIC PANEL WITH GFR
Anion gap: 11 (ref 5–15)
BUN: 30 mg/dL — ABNORMAL HIGH (ref 8–23)
CO2: 23 mmol/L (ref 22–32)
Calcium: 8.5 mg/dL — ABNORMAL LOW (ref 8.9–10.3)
Chloride: 97 mmol/L — ABNORMAL LOW (ref 98–111)
Creatinine, Ser: 2.83 mg/dL — ABNORMAL HIGH (ref 0.61–1.24)
GFR, Estimated: 23 mL/min — ABNORMAL LOW (ref >60)
Glucose, Bld: 215 mg/dL — ABNORMAL HIGH (ref 70–99)
Potassium: 3.2 mmol/L — ABNORMAL LOW (ref 3.5–5.1)
Sodium: 131 mmol/L — ABNORMAL LOW (ref 135–145)

## 2023-05-18 LAB — GLUCOSE, CAPILLARY
Glucose-Capillary: 205 mg/dL — ABNORMAL HIGH (ref 70–99)
Glucose-Capillary: 198 mg/dL — ABNORMAL HIGH (ref 70–99)
Glucose-Capillary: 206 mg/dL — ABNORMAL HIGH (ref 70–99)
Glucose-Capillary: 172 mg/dL — ABNORMAL HIGH (ref 70–99)
Glucose-Capillary: 183 mg/dL — ABNORMAL HIGH (ref 70–99)

## 2023-05-18 LAB — CULTURE, BLOOD (ROUTINE X 2)

## 2023-05-18 MED ORDER — SMOG ENEMA
960.0000 mL | Freq: Once | RECTAL | Status: AC
  Administered 2023-05-18: 960 mL via RECTAL
  Filled 2023-05-18: qty 960

## 2023-05-18 MED ORDER — INSULIN GLARGINE-YFGN 100 UNIT/ML ~~LOC~~ SOLN
10.0000 [IU] | Freq: Every day | SUBCUTANEOUS | Status: DC
  Administered 2023-05-18 – 2023-05-20 (×3): 10 [IU] via SUBCUTANEOUS
  Filled 2023-05-18 (×4): qty 0.1

## 2023-05-18 NOTE — Progress Notes (Signed)
 Id brief note   Afebrile On cipro Hx qtc prolongation   EKG 3/30 reviewed qtc 480    -continue cipro -Monday returning id team to review repeat EKG and determine final abx duration

## 2023-05-18 NOTE — Progress Notes (Signed)
 CVC was removed per order with no complications.  Pt supine and suspended inspiration during removal with instructions to remain supine for 30 minutes after removal.  Pressure held to achieve hemostasis.  Vaseline/gauze/tegaderm applied.  Patient education provided regarding lifting restrictions, site care, and signs of infection.  Pt verbalized understanding and had no questions.

## 2023-05-18 NOTE — Plan of Care (Signed)
  Problem: Skin Integrity: Goal: Risk for impaired skin integrity will decrease Outcome: Not Progressing   Problem: Metabolic: Goal: Ability to maintain appropriate glucose levels will improve Outcome: Not Progressing   Problem: Safety: Goal: Ability to remain free from injury will improve Outcome: Not Progressing   Problem: Skin Integrity: Goal: Risk for impaired skin integrity will decrease Outcome: Not Progressing   Problem: Elimination: Goal: Will not experience complications related to bowel motility Outcome: Not Progressing

## 2023-05-18 NOTE — Progress Notes (Signed)
   05/18/23 0051  BiPAP/CPAP/SIPAP  $ Non-Invasive Ventilator  Non-Invasive Vent Subsequent  BiPAP/CPAP/SIPAP Pt Type Adult  BiPAP/CPAP/SIPAP Resmed  Mask Type Nasal mask  Mask Size Medium  EPAP 4 cmH2O  FiO2 (%) 36 %  Flow Rate 4 lpm  Patient Home Machine No  Patient Home Mask No  Patient Home Tubing No  Auto Titrate No  CPAP/SIPAP surface wiped down Yes  Device Plugged into RED Power Outlet Yes  BiPAP/CPAP /SiPAP Vitals  Pulse Rate 71  SpO2 98 %  Bilateral Breath Sounds Clear  MEWS Score/Color  MEWS Score 0  MEWS Score Color [place] Si

## 2023-05-18 NOTE — Progress Notes (Signed)
 [facility] PROGRESS NOTE    Progress Note  Harold [name]  XBJ:[id number] DOB: 1956-01-26 DOA: 05/12/2023 PCP: [name], MD     Brief Narrative:   Harold [name] is an 68 y.o. male past medical history of morbid obesity, obstructive sleep apnea diabetes mellitus type 2 on A1c, CAD with a history of stent, atrial fibrillation on Eliquis, carotid stenosis, chronic kidney disease stage IIIb, diabetic peripheral neuropathy, hypothyroidism recently discharged on 05/10/2018 for comes in for right ankle pain BTK surgery was consulted underwent external fixation of the right ankle with closed reduction, plan to repeat surgery on 05/15/2023  Assessment/Plan:   Open right ankle fracture, sequela: On November 2024 underwent ORIF for ankle fracture, with revision in December 2024 for broken hardware. 04/28/2023 underwent hardware removal due to infection. 04/02/2023 underwent I&D by Dr. Lajoyce Corners. Now readmitted on 05/12/2023 for external fixation of the right ankle on the same day. Status post right ankle arthrodesis, right subtalar arthrodesis and removal of external fixation on 05/15/2023 Continue narcotics, anticoagulation and weightbearing per Ortho. PT evaluated the patient, TOC helping with disposition.  They relate [facility] is probably an option.  Bacteremia Enterobacter species: Tmax 98.1 no leukocytosis. Blood culture ID reflex panel showed Enterobacterales.  Blood cultures growing gram-negative rods ID was consulted they recommended to stop cefepime.  Start ciprofloxacin. Will need 4 weeks of antibiotics It was discussed renal, will discontinue HD catheter  Acute postoperative blood loss anemia/chronic kidney disease IV: Status post 2 unit packed red blood cells hemoglobin this morning 7.7. Keep hemoglobin above 8 due to coronary artery disease. CBC in the morning.  Chronic renal disease, stage IV (HCC) Renal function is stable, his baseline is around 3.2. He required  HD during last admission. His renal function is  around his baseline, basic metabolic panels pending this morning. Discussed with renal will discontinue HD catheter  Hypomagnesemia: repleted orally magnesium 1.8. Continue oral supplementation.  Paroxysmal atrial fibrillation: Currently on amiodarone, and Eliquis Try to keep potassium greater than 4 magnesium greater than 2.  No events on telemetry.  CAD, LAD/CFX DES 04/28/12- staged RCA DES 04/29/12 after pt declined CABG, cath 05/14/13  stable CAD medical therapy: Continue Plavix denies chest pain or shortness of breath.  Struct of sleep apnea: Continue CPAP.  Anxiety and depression:  continue Cymbalta.  Chronic hypotension: Continue midodrine.  OSA on CPAP Continue CPAP.  Acquired hypothyroidism Continue Synthroid.  Uncontrolled type 2 diabetes mellitus with hyperglycemia, with long-term current use of insulin: Blood glucose significantly elevated. Last A1c of 7. Cont. long-acting insulin, continue sliding scale insulin.  Chronic gout: Continue colchicine.  Morbid obesity: Noted  RN Pressure Injury Documentation: Pressure Injury 04/17/23 Ankle Right;Anterior Deep Tissue Pressure Injury - Purple or maroon localized area of discolored intact skin or blood-filled blister due to damage of underlying soft tissue from pressure and/or shear. (Active)  04/17/23 2040  Location: Ankle  Location Orientation: Right;Anterior  Staging: Deep Tissue Pressure Injury - Purple or maroon localized area of discolored intact skin or blood-filled blister due to damage of underlying soft tissue from pressure and/or shear.  Wound Description (Comments):   Present on Admission: Yes  Dressing Type Compression wrap 05/18/23 0737     Pressure Injury 05/17/23 Buttocks Left Stage 2 -  Partial thickness loss of dermis presenting as a shallow open injury with a red, pink wound bed without slough. st 2 pressure ulcer, (Active)  05/17/23 1945   Location: Buttocks  Location Orientation: Left  Staging: Stage  2 -  Partial thickness loss of dermis presenting as a shallow open injury with a red, pink wound bed without slough.  Wound Description (Comments): st 2 pressure ulcer,  Present on Admission: No  Dressing Type Foam - Lift dressing to assess site every shift 05/17/23 1945     Pressure Injury 05/17/23 Right Stage 2 -  Partial thickness loss of dermis presenting as a shallow open injury with a red, pink wound bed without slough. st 2 inner buttocks (Active)  05/17/23 1945  Location:   Location Orientation: Right  Staging: Stage 2 -  Partial thickness loss of dermis presenting as a shallow open injury with a red, pink wound bed without slough.  Wound Description (Comments): st 2 inner buttocks  Present on Admission: No  Dressing Type Foam - Lift dressing to assess site every shift 05/17/23 1945      DVT prophylaxis: Lovenox Family Communication:none Status is: Inpatient Remains inpatient appropriate because: Open right ankle fracture    Code Status:     Code Status Orders  (From admission, onward)           Start     Ordered   05/12/23 1338  Full code  Continuous       Question:  By:  Answer:  Other   05/12/23 1343           Code Status History     Date Active Date Inactive Code Status Order ID Comments User Context   03/27/2023 2243 05/10/2023 1618 Full Code [id number]  Angie Fava, DO ED   01/09/2023 0555 01/14/2023 1612 Full Code [id number]  Dolly Rias, MD ED   01/02/2023 0828 01/07/2023 1852 Full Code [id number]  Clydie Braun, MD ED   01/02/2023 0813 01/02/2023 0828 Full Code [id number]  Clydie Braun, MD ED   11/06/2022 1411 11/07/2022 2159 Full Code [id number]  Iran Ouch, MD Inpatient   07/26/2020 1434 07/27/2020 0140 Full Code [id number]  Coletta Memos, MD Inpatient   05/14/2013 1827 05/15/2013 1657 Full Code [id number]  Lennette Bihari, MD Inpatient   05/13/2013 2244 05/14/2013 1827  Full Code [id number]  Barrett, Joline Salt, PA-C Inpatient      Advance Directive Documentation    Flowsheet Row Most Recent Value  Type of Advance Directive Healthcare Power of Attorney, Living will  Pre-existing out of facility DNR order (yellow form or pink MOST form) --  "MOST" Form in Place? --         IV Access:   Peripheral IV   Procedures and diagnostic studies:   No results found.    Medical Consultants:   None.   Subjective:    Anish Vana Benton-Elliot no complaints.  Objective:    Vitals:   05/18/23 0422 05/18/23 0515 05/18/23 0817 05/18/23 0818  BP: (!) 150/54  (!) 150/66 (!) 150/66  Pulse: 67  63 64  Resp:  18    Temp: 97.8 F (36.6 C)  98.3 F (36.8 C) 98.3 F (36.8 C)  TempSrc: Oral  Oral Oral  SpO2: 100%  96% 96%  Weight:  127.4 kg    Height:       SpO2: 96 % O2 Flow Rate (L/min): 2 L/min FiO2 (%): 36 %   Intake/Output Summary (Last 24 hours) at 05/18/2023 0835 Last data filed at 05/18/2023 7564 Gross per 24 hour  Intake 640 ml  Output 950 ml  Net -310 ml   Filed Weights   05/14/23 0428 05/15/23 0500 05/18/23 0515  Weight: 135.2 kg 129.2 kg 127.4 kg    Exam: General exam: In no acute distress. Respiratory system: Good air movement and clear to auscultation. Cardiovascular system: S1 & S2 heard, RRR. No JVD. Gastrointestinal system: Abdomen is nondistended, soft and nontender.  Extremities: No pedal edema. Skin: No rashes, lesions or ulcers Psychiatry: Judgement and insight appear normal. Mood & affect appropriate. Data Reviewed:    Labs: Basic Metabolic Panel: Recent Labs  Lab 05/13/23 0648 05/14/23 0627 05/15/23 0543 05/16/23 0520 05/17/23 0917  NA 135 132* 132* 131* 130*  K 4.4 4.3 3.7 4.0 3.7  CL 101 100 97* 98 94*  CO2 24 24 24 25 24   GLUCOSE 252* 217* 192* 224* 194*  BUN 30* 28* 28* 29* 32*  CREATININE 3.68* 3.42* 3.07* 3.23* 3.10*  CALCIUM 8.2* 8.3* 8.5* 7.8* 8.5*  MG 1.4* 1.7 1.8 1.8  --   PHOS 4.0  --    --   --   --    GFR Estimated Creatinine Clearance: 31.5 mL/min (A) (by C-G formula based on SCr of 3.1 mg/dL (H)). Liver Function Tests: Recent Labs  Lab 05/13/23 0648  AST 11*  ALT 9  ALKPHOS 73  BILITOT 0.6  PROT 5.7*  ALBUMIN 2.6*   No results for input(s): "LIPASE", "AMYLASE" in the last 168 hours. No results for input(s): "AMMONIA" in the last 168 hours. Coagulation profile Recent Labs  Lab 05/12/23 0947  INR 1.5*   COVID-19 Labs  No results for input(s): "DDIMER", "FERRITIN", "LDH", "CRP" in the last 72 hours.  Lab Results  Component Value Date   SARSCOV2NAA NEGATIVE 03/27/2023   SARSCOV2NAA NEGATIVE 07/24/2020   SARSCOV2NAA NEGATIVE 10/09/2019    CBC: Recent Labs  Lab 05/13/23 0648 05/14/23 0627 05/15/23 0543 05/16/23 0520 05/17/23 0441  WBC 7.1 6.8 5.1 7.1 7.5  HGB 7.0* 7.9* 7.6* 8.3* 8.0*  HCT 21.7* 23.9* 23.0* 24.9* 23.4*  MCV 91.9 90.5 89.1 88.9 88.3  PLT 135* 122* 141* 177 176   Cardiac Enzymes: No results for input(s): "CKTOTAL", "CKMB", "CKMBINDEX", "TROPONINI" in the last 168 hours. BNP (last 3 results) No results for input(s): "PROBNP" in the last 8760 hours. CBG: Recent Labs  Lab 05/17/23 0846 05/17/23 1116 05/17/23 1605 05/17/23 2124 05/18/23 0515  GLUCAP 184* 165* 152* 157* 205*   D-Dimer: No results for input(s): "DDIMER" in the last 72 hours. Hgb A1c: No results for input(s): "HGBA1C" in the last 72 hours. Lipid Profile: No results for input(s): "CHOL", "HDL", "LDLCALC", "TRIG", "CHOLHDL", "LDLDIRECT" in the last 72 hours. Thyroid function studies: No results for input(s): "TSH", "T4TOTAL", "T3FREE", "THYROIDAB" in the last 72 hours.  Invalid input(s): "FREET3" Anemia work up: No results for input(s): "VITAMINB12", "FOLATE", "FERRITIN", "TIBC", "IRON", "RETICCTPCT" in the last 72 hours. Sepsis Labs: Recent Labs  Lab 05/14/23 0627 05/15/23 0543 05/16/23 0009 05/16/23 0520 05/17/23 0441  PROCALCITON  --   --   --   1.51  --   WBC 6.8 5.1  --  7.1 7.5  LATICACIDVEN  --   --  1.0  --   --    Microbiology Recent Results (from the past 240 hours)  Surgical pcr screen     Status: None   Collection Time: 05/12/23  5:04 PM   Specimen: Nasal Mucosa; Nasal Swab  Result Value Ref Range Status   MRSA, PCR NEGATIVE NEGATIVE Final   Staphylococcus aureus NEGATIVE NEGATIVE Final    Comment: (NOTE) The Xpert SA Assay (FDA approved for NASAL specimens in  patients 46 years of age and older), is one component of a comprehensive surveillance program. It is not intended to diagnose infection nor to guide or monitor treatment. Performed at [facility], [address] [place], [place] [address]   Culture, blood (Routine X 2) w Reflex to ID Panel     Status: None (Preliminary result)   Collection Time: 05/16/23  2:06 AM   Specimen: BLOOD RIGHT ARM  Result Value Ref Range Status   Specimen Description BLOOD RIGHT ARM  Final   Special Requests   Final    BOTTLES DRAWN AEROBIC AND ANAEROBIC Blood Culture results may not be optimal due to an inadequate volume of blood received in culture bottles   Culture  Setup Time   Final    GRAM NEGATIVE RODS ANAEROBIC BOTTLE ONLY CRITICAL RESULT CALLED TO, READ BACK BY AND VERIFIED WITH: Neil Crouch [id number] AT 1741 BY EC Performed at [facility], [address], [place], [place] [address]    Culture GRAM NEGATIVE RODS  Final   Report Status PENDING  Incomplete  Blood Culture ID Panel (Reflexed)     Status: Abnormal   Collection Time: 05/16/23  2:06 AM  Result Value Ref Range Status   Enterococcus faecalis NOT DETECTED NOT DETECTED Final   Enterococcus Faecium NOT DETECTED NOT DETECTED Final   Listeria monocytogenes NOT DETECTED NOT DETECTED Final   Staphylococcus species NOT DETECTED NOT DETECTED Final   Staphylococcus aureus (BCID) NOT DETECTED NOT DETECTED Final   Staphylococcus epidermidis NOT DETECTED NOT DETECTED Final   Staphylococcus  lugdunensis NOT DETECTED NOT DETECTED Final   Streptococcus species NOT DETECTED NOT DETECTED Final   Streptococcus agalactiae NOT DETECTED NOT DETECTED Final   Streptococcus pneumoniae NOT DETECTED NOT DETECTED Final   Streptococcus pyogenes NOT DETECTED NOT DETECTED Final   A.calcoaceticus-baumannii NOT DETECTED NOT DETECTED Final   Bacteroides fragilis NOT DETECTED NOT DETECTED Final   Enterobacterales DETECTED (A) NOT DETECTED Final    Comment: Enterobacterales represent a large order of gram negative bacteria, not a single organism. CRITICAL RESULT CALLED TO, READ BACK BY AND VERIFIED WITH: PHARMD [name] BITONTI [id number] AT 1741 BY EC    Enterobacter cloacae complex DETECTED (A) NOT DETECTED Final    Comment: CRITICAL RESULT CALLED TO, READ BACK BY AND VERIFIED WITH: PHARMD [name] BITONTI [id number] AT 1741 BY EC    Escherichia coli NOT DETECTED NOT DETECTED Final   Klebsiella aerogenes NOT DETECTED NOT DETECTED Final   Klebsiella oxytoca NOT DETECTED NOT DETECTED Final   Klebsiella pneumoniae NOT DETECTED NOT DETECTED Final   Proteus species NOT DETECTED NOT DETECTED Final   Salmonella species NOT DETECTED NOT DETECTED Final   Serratia marcescens NOT DETECTED NOT DETECTED Final   Haemophilus influenzae NOT DETECTED NOT DETECTED Final   Neisseria meningitidis NOT DETECTED NOT DETECTED Final   Pseudomonas aeruginosa NOT DETECTED NOT DETECTED Final   Stenotrophomonas maltophilia NOT DETECTED NOT DETECTED Final   Candida albicans NOT DETECTED NOT DETECTED Final   Candida auris NOT DETECTED NOT DETECTED Final   Candida glabrata NOT DETECTED NOT DETECTED Final   Candida krusei NOT DETECTED NOT DETECTED Final   Candida parapsilosis NOT DETECTED NOT DETECTED Final   Candida tropicalis NOT DETECTED NOT DETECTED Final   Cryptococcus neoformans/gattii NOT DETECTED NOT DETECTED Final   CTX-M ESBL NOT DETECTED NOT DETECTED Final   Carbapenem resistance IMP NOT DETECTED NOT DETECTED Final    Carbapenem resistance KPC NOT DETECTED NOT DETECTED  Final   Carbapenem resistance NDM NOT DETECTED NOT DETECTED Final   Carbapenem resist OXA 48 LIKE NOT DETECTED NOT DETECTED Final   Carbapenem resistance VIM NOT DETECTED NOT DETECTED Final    Comment: Performed at [facility], [address] [place], [place] [address]  Culture, blood (Routine X 2) w Reflex to ID Panel     Status: None (Preliminary result)   Collection Time: 05/16/23  2:08 AM   Specimen: BLOOD RIGHT ARM  Result Value Ref Range Status   Specimen Description BLOOD RIGHT ARM  Final   Special Requests   Final    BOTTLES DRAWN AEROBIC AND ANAEROBIC Blood Culture adequate volume   Culture   Final    NO GROWTH 2 DAYS Performed at [facility], [address], [place], [place] [address]    Report Status PENDING  Incomplete     Medications:    sodium chloride   Intravenous Once   allopurinol  50 mg Oral Daily   amiodarone  200 mg Oral BID   apixaban  5 mg Oral BID   atorvastatin  40 mg Oral Daily   B-complex with vitamin C  1 tablet Oral Daily   Chlorhexidine Gluconate Cloth  6 each Topical Daily   clopidogrel  75 mg Oral q morning   colchicine  0.6 mg Oral QODAY   vitamin B-12  1,000 mcg Oral Daily   docusate sodium  100 mg Oral BID   DULoxetine  60 mg Oral Daily   ezetimibe  10 mg Oral Daily   feeding supplement  237 mL Oral BID BM   ferrous sulfate  325 mg Oral Q breakfast   gabapentin  300 mg Oral QHS   Gerhardt's butt cream   Topical BID   insulin aspart  0-5 Units Subcutaneous QHS   insulin aspart  0-9 Units Subcutaneous TID WC   insulin aspart  2 Units Subcutaneous TID WC   insulin glargine-yfgn  5 Units Subcutaneous QHS   levothyroxine  88 mcg Oral Q0600   midodrine  5 mg Oral TID WC   polyethylene glycol  17 g Oral BID   sodium chloride flush  3 mL Intravenous Q12H   sodium chloride flush  3-10 mL Intravenous Q12H   SMOG  960 mL Rectal Once   Vitamin D (Ergocalciferol)  50,000 Units  Oral Q7 days   Continuous Infusions:  ciprofloxacin 400 mg (05/18/23 0422)      LOS: 6 days   Marinda Elk  [facility]  05/18/2023, 8:35 AM

## 2023-05-19 DIAGNOSIS — S82891A Other fracture of right lower leg, initial encounter for closed fracture: Secondary | ICD-10-CM | POA: Diagnosis not present

## 2023-05-19 DIAGNOSIS — I2510 Atherosclerotic heart disease of native coronary artery without angina pectoris: Secondary | ICD-10-CM | POA: Diagnosis not present

## 2023-05-19 DIAGNOSIS — S82891S Other fracture of right lower leg, sequela: Secondary | ICD-10-CM | POA: Diagnosis not present

## 2023-05-19 DIAGNOSIS — E1140 Type 2 diabetes mellitus with diabetic neuropathy, unspecified: Secondary | ICD-10-CM | POA: Diagnosis not present

## 2023-05-19 DIAGNOSIS — B9689 Other specified bacterial agents as the cause of diseases classified elsewhere: Secondary | ICD-10-CM

## 2023-05-19 DIAGNOSIS — J449 Chronic obstructive pulmonary disease, unspecified: Secondary | ICD-10-CM | POA: Diagnosis not present

## 2023-05-19 LAB — BASIC METABOLIC PANEL WITH GFR
Anion gap: 12 (ref 5–15)
BUN: 27 mg/dL — ABNORMAL HIGH (ref 8–23)
CO2: 21 mmol/L — ABNORMAL LOW (ref 22–32)
Calcium: 8.6 mg/dL — ABNORMAL LOW (ref 8.9–10.3)
Chloride: 96 mmol/L — ABNORMAL LOW (ref 98–111)
Creatinine, Ser: 2.76 mg/dL — ABNORMAL HIGH (ref 0.61–1.24)
GFR, Estimated: 24 mL/min — ABNORMAL LOW (ref >60)
Glucose, Bld: 228 mg/dL — ABNORMAL HIGH (ref 70–99)
Potassium: 3.2 mmol/L — ABNORMAL LOW (ref 3.5–5.1)
Sodium: 129 mmol/L — ABNORMAL LOW (ref 135–145)

## 2023-05-19 LAB — CBC
HCT: 22.5 % — ABNORMAL LOW (ref 39.0–52.0)
Hemoglobin: 7.5 g/dL — ABNORMAL LOW (ref 13.0–17.0)
MCH: 29.3 pg (ref 26.0–34.0)
MCHC: 33.3 g/dL (ref 30.0–36.0)
MCV: 87.9 fL (ref 80.0–100.0)
Platelets: 197 10*3/uL (ref 150–400)
RBC: 2.56 MIL/uL — ABNORMAL LOW (ref 4.22–5.81)
RDW: 15.3 % (ref 11.5–15.5)
WBC: 6.6 10*3/uL (ref 4.0–10.5)
nRBC: 0 % (ref 0.0–0.2)

## 2023-05-19 LAB — GLUCOSE, CAPILLARY
Glucose-Capillary: 198 mg/dL — ABNORMAL HIGH (ref 70–99)
Glucose-Capillary: 179 mg/dL — ABNORMAL HIGH (ref 70–99)
Glucose-Capillary: 156 mg/dL — ABNORMAL HIGH (ref 70–99)
Glucose-Capillary: 160 mg/dL — ABNORMAL HIGH (ref 70–99)

## 2023-05-19 LAB — PREPARE RBC (CROSSMATCH)

## 2023-05-19 LAB — SEDIMENTATION RATE: Sed Rate: 131 mm/h — ABNORMAL HIGH (ref 0–16)

## 2023-05-19 LAB — C-REACTIVE PROTEIN: CRP: 23.2 mg/dL — ABNORMAL HIGH (ref <1.0)

## 2023-05-19 MED ORDER — ORAL CARE MOUTH RINSE: 15.0000 mL | OROMUCOSAL | Status: DC | PRN

## 2023-05-19 MED ORDER — POTASSIUM CHLORIDE CRYS ER 20 MEQ PO TBCR
40.0000 meq | EXTENDED_RELEASE_TABLET | ORAL | Status: AC
  Administered 2023-05-19 (×2): 40 meq via ORAL
  Filled 2023-05-19 (×2): qty 2

## 2023-05-19 MED ORDER — CIPROFLOXACIN HCL 500 MG PO TABS
500.0000 mg | ORAL_TABLET | Freq: Two times a day (BID) | ORAL | Status: DC
  Administered 2023-05-19 – 2023-05-21 (×4): 500 mg via ORAL
  Filled 2023-05-19 (×4): qty 1

## 2023-05-19 MED ORDER — MIDODRINE HCL 5 MG PO TABS
2.5000 mg | ORAL_TABLET | Freq: Two times a day (BID) | ORAL | Status: DC
  Administered 2023-05-20: 2.5 mg via ORAL
  Filled 2023-05-19 (×3): qty 1

## 2023-05-19 MED ORDER — SODIUM CHLORIDE 0.9% IV SOLUTION: Freq: Once | INTRAVENOUS | Status: AC

## 2023-05-19 NOTE — Progress Notes (Addendum)
 I have seen and examined the patient. I have personally reviewed the clinical findings, laboratory findings, microbiological data and imaging studies. The assessment and treatment plan was discussed with the Nurse Practitioner. I agree with her/his recommendations except following additions/corrections.  Afebrile Qtc in the last few days < 500, Prior concerns for AIN with cefepime  HDC removed 3/30, repeat blood cx 3/31 NGTD  Exam - adult male lying in the bed, non toxic appearing, Heart, lung, abdomen wnl. Awake, alert and oriented, non focal, rt foot is wrapped in a bandage C/D/I  # Enterobacter cloacae bacteremia in the setting of Rt open trimalleolar ankle # s/p removal of ex fix, hindfoot fusion, nail placement, arthrodesis of rt subtalar joint and rt tibiotalar joint with hind foot fusion nail + HD catheter - HDC catheter removed 3/30, repeat blood cx 3/31 NGTD, no need for HD so far - continue ciprofloxacin for 4 weeks from date of negative blood cx on 3/31 if repeat blood cx on 3/31 stay negative by 4/2. EOT 4/28 -Needs close monitoring of qtc by Cardiology, would arrange for fu prior to discharge. - Universal/standard isolation precautions   -Patient has a fu arranged on 4/10 at 8: 45 am. ID will so for now, recall back with questions or concerns.   I have personally spent 50 minutes involved in face-to-face and non-face-to-face activities for this patient on the day of the visit. Professional time spent includes the following activities: Preparing to see the patient (review of tests), Obtaining and/or reviewing separately obtained history (admission/discharge record), Performing a medically appropriate examination and/or evaluation , Ordering medications/tests/procedures, referring and communicating with other health care professionals, Documenting clinical information in the EMR, Independently interpreting results (not separately reported), Communicating results to the  patient/family/caregiver, Counseling and educating the patient/family/caregiver and Care coordination (not separately reported).      [facility]  Date of Admission:  05/12/2023     Reason for Follow Up: Open right ankle fracture, sequela  Total days of antibiotics 7         ASSESSMENT:  [name] is POD #4 from arthrodesis of the right ankle with hindfoot fusion nail following a fall in the setting of previous right distal lower extremity ORIF associated with infection with Enterobacter cloacae and MRSE.  Blood cultures found to have Enterobacter cloacae.  CVC was removed on 05/18/23. Hoping to avoid future dialysis. Appears to be tolerating renally dosed ciprofloxacin with stable QTc with most recent being 472 ms.  Continue postoperative wound care per orthopedics.  Continue current dose of ciprofloxacin for 4 weeks and therapeutic drug monitoring of QTc while on ciprofloxacin. Continue standard precautions.  Remaining medical and supportive care per internal medicine.  PLAN:  Continue current dose of ciprofloxacin for 4 weeks from surgery. End date 06/16/23.  Therapeutic drug monitoring of QTc while on ciprofloxacin. Postoperative wound care per orthopedics. Follow-up in ID clinic. Continue standard/universal precautions. Remaining medical and supportive care per internal medicine  Principal Problem:   Open right ankle fracture, sequela Active Problems:   Essential hypertension   Anxiety and depression   CAD, LAD/CFX DES 04/28/12- staged RCA DES 04/29/12 after pt declined CABG, cath 05/14/13 stable CAD medical therapy   OSA on CPAP   Chronic renal disease, stage IV (HCC)   Gastroesophageal reflux disease without esophagitis   Paroxysmal atrial fibrillation (HCC)   Uncontrolled type 2 diabetes mellitus with hyperglycemia, with long-term current use of insulin (HCC)   Acquired hypothyroidism  sodium chloride   Intravenous Once   allopurinol  50  mg Oral Daily   amiodarone  200 mg Oral BID   apixaban  5 mg Oral BID   atorvastatin  40 mg Oral Daily   B-complex with vitamin C  1 tablet Oral Daily   Chlorhexidine Gluconate Cloth  6 each Topical Daily   clopidogrel  75 mg Oral q morning   colchicine  0.6 mg Oral QODAY   vitamin B-12  1,000 mcg Oral Daily   docusate sodium  100 mg Oral BID   DULoxetine  60 mg Oral Daily   ezetimibe  10 mg Oral Daily   feeding supplement  237 mL Oral BID BM   ferrous sulfate  325 mg Oral Q breakfast   gabapentin  300 mg Oral QHS   Gerhardt's butt cream   Topical BID   insulin aspart  0-5 Units Subcutaneous QHS   insulin aspart  0-9 Units Subcutaneous TID WC   insulin aspart  2 Units Subcutaneous TID WC   insulin glargine-yfgn  10 Units Subcutaneous QHS   levothyroxine  88 mcg Oral Q0600   midodrine  2.5 mg Oral BID WC   sodium chloride flush  3 mL Intravenous Q12H   sodium chloride flush  3-10 mL Intravenous Q12H   Vitamin D (Ergocalciferol)  50,000 Units Oral Q7 days    SUBJECTIVE:  Afebrile overnight with no acute events.  Tolerating antibiotics with no adverse side effects.  QTc completed today of 472 ms.   Allergies  Allergen Reactions   Doxycycline Hives and Rash   Septra [Bactrim] Itching   Penicillins Rash    Allergy to All cillin drugs  Ancef given 9/24 with no obvious reaction   Metformin And Related Diarrhea and Nausea Only   Other Nausea And Vomiting    General Anesthesia - sometimes causes nausea and vomiting * OK to use scopolamine patch     Sulfamethoxazole-Trimethoprim Other (See Comments)   Wellbutrin [Bupropion]     Mood Changes   Past Medical History:  Diagnosis Date   Anemia    Anginal pain (HCC) 04/2013   Cardiac cath showed patent stents with distal LAD, circumflex-OM and RCA disease in small vessels.   Anxiety    Atrial fibrillation (HCC) 12/2021   CAD S/P percutaneous coronary angioplasty 2003, 04/2012   status post PCI to LAD, circumflex-OM 2, RCA    Carotid artery occlusion    Chronic renal insufficiency, stage II (mild)    Chronic venous insufficiency    varicosities, no reflux; dopplers 04/14/12- valvular insufficiency in the R and L GSV   Complication of anesthesia    COPD, mild (HCC)    Depression    situaltional    Diabetes mellitus    Diabetic neuropathy (HCC) 08/24/2019   Dyslipidemia associated with type 2 diabetes mellitus (HCC)    Fall at home, initial encounter 01/02/2023   GERD (gastroesophageal reflux disease)    History of cardioversion 12/2021   at [facility]   HTN (hypertension)    Hypothyroidism    Neuromuscular disorder (HCC)    neuropathy in feet   Neuropathy    notably improved following PCI with improved cardiac function   Obesity    Obesity, Class II, BMI 35.0-39.9, with comorbidity (see actual BMI)    BMI 39; wgt loss efforts in place; seeing Dietician   Open ankle fracture 01/02/2023   Orthostatic hypotension    PONV (postoperative nausea and vomiting)    "Patch  Works"   Pulmonary hypertension (HCC) 04/2012   PA pressure   Sleep apnea    uses nightly   Vitamin D deficiency    per facility notes   Past Surgical History:  Procedure Laterality Date   ABDOMINAL AORTAGRAM N/A 11/15/2011   Procedure: ABDOMINAL Ronny Flurry;  Surgeon: Sherren Kerns, MD;  Location: [facility] CATH LAB;  Service: Cardiovascular;  [name];   ABDOMINAL AORTOGRAM W/LOWER EXTREMITY N/A 10/13/2019   Procedure: ABDOMINAL AORTOGRAM W/LOWER EXTREMITY;  Surgeon: Iran Ouch, MD;  Location: [facility] INVASIVE CV LAB;  Service: CV: Non-obst Ao-Iliac. R SFA-PopA patent with significant calcific stenosis of TP trunk-prox PTA (dominant) & CTO ATA  -> R PTA-TP PTCA   ABDOMINAL AORTOGRAM W/LOWER EXTREMITY N/A 11/06/2022   Procedure: ABDOMINAL AORTOGRAM W/LOWER EXTREMITY;  Surgeon: Iran Ouch, MD;  [facility]: [facility];  Service: Cardiovascular;  [name];   ABDOMINAL AORTOGRAM W/LOWER EXTREMITY Right 04/01/2023    Procedure: ABDOMINAL AORTOGRAM W/LOWER EXTREMITY;  Surgeon: Nada Libman, MD;  [facility]: [facility] CV LAB;  Service: Cardiovascular;  Laterality: Right;   ABIs  04/27/2012   mild bilateral arterial insufficiency   ANKLE FUSION Right 05/15/2023   Procedure: ARTHRODESIS ANKLE WITH HINDFOOT FUSION NAIL;  Surgeon: Roby Lofts, MD;  [name];  Service: Orthopedics;  Laterality: Right;   APPLICATION OF WOUND VAC Right 03/31/2023   Procedure: APPLICATION OF WOUND VAC;  Surgeon: Roby Lofts, MD;  [name];  Service: Orthopedics;  Laterality: Right;   BACK SURGERY  2005 x1   X2-2010   BUNIONECTOMY Right 11/11/2013   Procedure: RIGHT FOOT SILVER BUNIONECTOMY;  Surgeon: Toni Arthurs, MD;  Location: [facility];  Service: Orthopedics;  Laterality: Right;   CARDIAC CATHETERIZATION  12/11/2001   significant 3V CAD, normal LV function   CARDIOVERSION N/A 03/13/2022   Procedure: CARDIOVERSION;  Surgeon: Little Ishikawa, MD;  Location: [facility] ENDOSCOPY;  Service: Cardiovascular;  [name];   CARDIOVERSION N/A 07/25/2022   Procedure: CARDIOVERSION;  Surgeon: Jake Bathe, MD;  [facility];  Service: Cardiovascular;  [name];   Carotid Doppler  04/27/2012   right internal carotid: Elevated velocities but no evidence of plaque. Left internal carotid 40-59%   CAROTID ENDARTERECTOMY  2005   Right; recent carotid Dopplers notes elevated velocities.    colonscopy     CORONARY ANGIOPLASTY  12/21/2001   PTCA of the distal and mid AV groove circ, unsuccessful PTCA of second OM total occlusion, unsuccessful PTCA of the apical LAD total occlusion   CORONARY ANGIOPLASTY WITH STENT PLACEMENT  04/29/2012   PCI to 3 RCA lesions, Promus Premiere 2.55mmx8mm distally, mid was 2.17mx28mm and proximally 2.75x43mm, EF 55-60%   CORONARY STENT PLACEMENT  04/28/2012   PCI to LAD (3x69mm Xience DES postdilated to 3.25) and circ prox and mid (2 overlappinmg  2.42mmx12mmXience DES postdilated to 2.11mm)   DOPPLER ECHOCARDIOGRAPHY  04/28/2012   poor quality study: EF estimated 60-65%; unable to assess diastolic function (previously noted to have diastolic dysfunction); severely dilated left atrium and mild right atrium; dilated IVC consistent with increased central venous pressure.Marland Kitchen   EXTERNAL FIXATION LEG Right 05/12/2023   Procedure: EXTERNAL FIXATION, LOWER EXTREMITY;  Surgeon: Roby Lofts, MD;  [name];  Service: Orthopedics;  Laterality: Right;   HARDWARE REMOVAL Right 03/31/2023   Procedure: HARDWARE REMOVAL RIGHT ANKLE;  Surgeon: Roby Lofts, MD;  [name];  Service: Orthopedics;  Laterality: Right;  HARDWARE REMOVAL Right 01/28/2023   Procedure: REMOVAL OF HARDWARE RIGHT ANKLE;  Surgeon: [name], MD;  [name];  Service: Orthopedics;  Laterality: Right;   HERNIA REPAIR     I & D EXTREMITY Right 01/02/2023   Procedure: IRRIGATION AND DEBRIDEMENT RIGHT ANKLE;  Surgeon: [name], MD;  [name];  Service: Orthopedics;  Laterality: Right;   I & D EXTREMITY Right 04/02/2023   Procedure: DEBRIDEMENT RIGHT ANKLE;  Surgeon: Nadara Mustard, MD;  Location: [facility] OR;  Service: Orthopedics;  Laterality: Right;   IR FLUORO GUIDE CV LINE LEFT  04/22/2023   IR FLUORO GUIDE CV LINE RIGHT  04/07/2023   IR PATIENT EVAL TECH 0-60 MINS  04/07/2023   IR RADIOLOGIST EVAL & MGMT  04/08/2023   IR REMOVAL TUN CV CATH W/O FL  05/05/2023   IR US GUIDE VASC ACCESS LEFT  04/22/2023   IR US GUIDE VASC ACCESS RIGHT  04/07/2023   LEFT AND RIGHT HEART CATHETERIZATION WITH CORONARY ANGIOGRAM N/A 04/27/2012   Procedure: LEFT AND RIGHT HEART CATHETERIZATION WITH CORONARY ANGIOGRAM;  Surgeon: Marykay Lex, MD;  Location: [facility] CATH LAB;  Service: Cardiovascular;  [name];   LEFT AND RIGHT HEART CATHETERIZATION WITH CORONARY ANGIOGRAM N/A 05/14/2013   Procedure: LEFT AND RIGHT HEART CATHETERIZATION WITH CORONARY ANGIOGRAM;  Surgeon:  Lennette Bihari, MD;  [name]; PCWP ;; [name], [name], Patent RCA ostial mid and distal stents - 70% distal runoff Dz   LUMBAR LAMINECTOMY/DECOMPRESSION MICRODISCECTOMY  01/07/2012   Procedure: LUMBAR LAMINECTOMY/DECOMPRESSION MICRODISCECTOMY 1 LEVEL;  Surgeon: Carmela Hurt, MD;  [name];  Service: Neurosurgery;  Laterality: Bilateral;   Lumbar Three-Four Decompression   LUMBAR LAMINECTOMY/DECOMPRESSION MICRODISCECTOMY N/A 07/26/2020   Procedure: Lumbar two-three Laminectomy/Foraminotomy;  Surgeon: Coletta Memos, MD;  [name];  Service: Neurosurgery;  [name];   ORIF ANKLE FRACTURE Right 01/02/2023   Procedure: OPEN REDUCTION INTERNAL FIXATION (ORIF)RIGHT ANKLE FRACTURE;  Surgeon: [name], MD;  [name];  Service: Orthopedics;  Laterality: Right;   PERCUTANEOUS CORONARY STENT INTERVENTION (PCI-S) N/A 04/28/2012   Procedure: PERCUTANEOUS CORONARY STENT INTERVENTION (PCI-S);  Surgeon: Lennette Bihari, MD;  Location: [facility] CATH LAB;  Service: Cardiovascular;  [name];   PERCUTANEOUS CORONARY STENT INTERVENTION (PCI-S) N/A 04/29/2012   Procedure: PERCUTANEOUS CORONARY STENT INTERVENTION (PCI-S);  Surgeon: Marykay Lex, MD;  Location: [facility] CATH LAB;  Service: Cardiovascular;  [name];   PERIPHERAL VASCULAR ATHERECTOMY  11/06/2022   Procedure: PERIPHERAL VASCULAR ATHERECTOMY;  Surgeon: Iran Ouch, MD;  [facility];  Service: Cardiovascular;;   PERIPHERAL VASCULAR BALLOON ANGIOPLASTY Right 10/13/2019   Procedure: PERIPHERAL VASCULAR BALLOON ANGIOPLASTY;  Surgeon: Iran Ouch, MD;  [facility];  Service: Cardiovascular;  Laterality: Right;  PTCA of R TP Trunk into PTA (Drug-coated) => Follow-up ABIs 0.9 on the right and 0.99 on the left.   PERIPHERAL  VASCULAR BALLOON ANGIOPLASTY Right 04/01/2023   Procedure: PERIPHERAL VASCULAR BALLOON ANGIOPLASTY;  Surgeon: Nada Libman, MD;  [facility];  Service: Cardiovascular;  Laterality: Right;   PERIPHERAL VASCULAR INTERVENTION Right 04/01/2023   Procedure: PERIPHERAL VASCULAR INTERVENTION;  Surgeon: Nada Libman, MD;  [facility];  Service: Cardiovascular;  Laterality: Right;   SPINE SURGERY  UMBILICAL HERNIA REPAIR  2009   steel mesh insert     Review of Systems: Review of Systems  Constitutional:  Negative for chills, fever and weight loss.  Respiratory:  Negative for cough, shortness of breath and wheezing.   Cardiovascular:  Negative for chest pain and leg swelling.  Gastrointestinal:  Negative for abdominal pain, constipation, diarrhea, nausea and vomiting.  Skin:  Negative for rash.      OBJECTIVE: Vitals:   05/18/23 2325 05/19/23 0421 05/19/23 0500 05/19/23 0715  BP:  (!) 154/64  (!) 160/59  Pulse:  62  62  Resp: (!) 24     Temp:  98.1 F (36.7 C)  98 F (36.7 C)  TempSrc:  Oral    SpO2: 97% 100%  98%  Weight:   126.3 kg   Height:       Body mass index is 36.74 kg/m.  Physical Exam Constitutional:      General: He is not in acute distress.    Appearance: He is well-developed.  Cardiovascular:     Rate and Rhythm: Normal rate and regular rhythm.     Heart sounds: Normal heart sounds.  Pulmonary:     Effort: Pulmonary effort is normal.     Breath sounds: Normal breath sounds.  Skin:    General: Skin is warm and dry.  Neurological:     Mental Status: He is alert and oriented to person, place, and time.  Psychiatric:        Mood and Affect: Mood normal.     Lab Results Lab Results  Component Value Date   WBC 6.6 05/19/2023   HGB 7.5 (L) 05/19/2023   HCT 22.5 (L) 05/19/2023   MCV 87.9 05/19/2023   PLT 197 05/19/2023    Lab Results  Component Value Date   CREATININE 2.76 (H) 05/19/2023   BUN 27 (H) 05/19/2023    NA 129 (L) 05/19/2023   K 3.2 (L) 05/19/2023   CL 96 (L) 05/19/2023   CO2 21 (L) 05/19/2023    Lab Results  Component Value Date   ALT 9 05/13/2023   AST 11 (L) 05/13/2023   GGT 28 02/20/2018   ALKPHOS 73 05/13/2023   BILITOT 0.6 05/13/2023     Microbiology: Recent Results (from the past 240 hours)  Surgical pcr screen     Status: None   Collection Time: 05/12/23  5:04 PM   Specimen: Nasal Mucosa; Nasal Swab  Result Value Ref Range Status   MRSA, PCR NEGATIVE NEGATIVE Final   Staphylococcus aureus NEGATIVE NEGATIVE Final    Comment: (NOTE) The Xpert SA Assay (FDA approved for NASAL specimens in patients 76 years of age and older), is one component of a comprehensive surveillance program. It is not intended to diagnose infection nor to guide or monitor treatment. Performed at [facility], [address], [place], [place] [address]   Culture, blood (Routine X 2) w Reflex to ID Panel     Status: Abnormal   Collection Time: 05/16/23  2:06 AM   Specimen: BLOOD RIGHT ARM  Result Value Ref Range Status   Specimen Description BLOOD RIGHT ARM  Final   Special Requests   Final    BOTTLES DRAWN AEROBIC AND ANAEROBIC Blood Culture results may not be optimal due to an inadequate volume of blood received in culture bottles   Culture  Setup Time   Final    GRAM NEGATIVE RODS IN BOTH AEROBIC AND ANAEROBIC BOTTLES CRITICAL RESULT CALLED TO,  READ BACK BY AND VERIFIED WITH: Neil Crouch [id number] AT 1741 BY EC Performed at [facility], [address], [place], [place] [address]    Culture ENTEROBACTER CLOACAE (A)  Final   Report Status 05/18/2023 FINAL  Final   Organism ID, Bacteria ENTEROBACTER CLOACAE  Final      Susceptibility   Enterobacter cloacae - MIC*    CEFEPIME <=0.12 SENSITIVE Sensitive     CEFTAZIDIME <=1 SENSITIVE Sensitive     CIPROFLOXACIN <=0.25 SENSITIVE Sensitive     GENTAMICIN <=1 SENSITIVE Sensitive     IMIPENEM 0.5 SENSITIVE Sensitive      TRIMETH/SULFA <=20 SENSITIVE Sensitive     PIP/TAZO <=4 SENSITIVE Sensitive ug/mL    * ENTEROBACTER CLOACAE  Blood Culture ID Panel (Reflexed)     Status: Abnormal   Collection Time: 05/16/23  2:06 AM  Result Value Ref Range Status   Enterococcus faecalis NOT DETECTED NOT DETECTED Final   Enterococcus Faecium NOT DETECTED NOT DETECTED Final   Listeria monocytogenes NOT DETECTED NOT DETECTED Final   Staphylococcus species NOT DETECTED NOT DETECTED Final   Staphylococcus aureus (BCID) NOT DETECTED NOT DETECTED Final   Staphylococcus epidermidis NOT DETECTED NOT DETECTED Final   Staphylococcus lugdunensis NOT DETECTED NOT DETECTED Final   Streptococcus species NOT DETECTED NOT DETECTED Final   Streptococcus agalactiae NOT DETECTED NOT DETECTED Final   Streptococcus pneumoniae NOT DETECTED NOT DETECTED Final   Streptococcus pyogenes NOT DETECTED NOT DETECTED Final   A.calcoaceticus-baumannii NOT DETECTED NOT DETECTED Final   Bacteroides fragilis NOT DETECTED NOT DETECTED Final   Enterobacterales DETECTED (A) NOT DETECTED Final    Comment: Enterobacterales represent a large order of gram negative bacteria, not a single organism. CRITICAL RESULT CALLED TO, READ BACK BY AND VERIFIED WITH: PHARMD [name] BITONTI [id number] AT 1741 BY EC    Enterobacter cloacae complex DETECTED (A) NOT DETECTED Final    Comment: CRITICAL RESULT CALLED TO, READ BACK BY AND VERIFIED WITH: PHARMD [name] BITONTI [id number] AT 1741 BY EC    Escherichia coli NOT DETECTED NOT DETECTED Final   Klebsiella aerogenes NOT DETECTED NOT DETECTED Final   Klebsiella oxytoca NOT DETECTED NOT DETECTED Final   Klebsiella pneumoniae NOT DETECTED NOT DETECTED Final   Proteus species NOT DETECTED NOT DETECTED Final   Salmonella species NOT DETECTED NOT DETECTED Final   Serratia marcescens NOT DETECTED NOT DETECTED Final   Haemophilus influenzae NOT DETECTED NOT DETECTED Final   Neisseria meningitidis NOT DETECTED NOT DETECTED  Final   Pseudomonas aeruginosa NOT DETECTED NOT DETECTED Final   Stenotrophomonas maltophilia NOT DETECTED NOT DETECTED Final   Candida albicans NOT DETECTED NOT DETECTED Final   Candida auris NOT DETECTED NOT DETECTED Final   Candida glabrata NOT DETECTED NOT DETECTED Final   Candida krusei NOT DETECTED NOT DETECTED Final   Candida parapsilosis NOT DETECTED NOT DETECTED Final   Candida tropicalis NOT DETECTED NOT DETECTED Final   Cryptococcus neoformans/gattii NOT DETECTED NOT DETECTED Final   CTX-M ESBL NOT DETECTED NOT DETECTED Final   Carbapenem resistance IMP NOT DETECTED NOT DETECTED Final   Carbapenem resistance KPC NOT DETECTED NOT DETECTED Final   Carbapenem resistance NDM NOT DETECTED NOT DETECTED Final   Carbapenem resist OXA 48 LIKE NOT DETECTED NOT DETECTED Final   Carbapenem resistance VIM NOT DETECTED NOT DETECTED Final    Comment: Performed at [facility], [address] [place], [place] [address]  Culture, blood (Routine X 2) w Reflex to ID Panel  Status: None (Preliminary result)   Collection Time: 05/16/23  2:08 AM   Specimen: BLOOD RIGHT ARM  Result Value Ref Range Status   Specimen Description BLOOD RIGHT ARM  Final   Special Requests   Final    BOTTLES DRAWN AEROBIC AND ANAEROBIC Blood Culture adequate volume   Culture   Final    NO GROWTH 2 DAYS Performed at [facility], [address], [place], [place] [address]    Report Status PENDING  Incomplete   Imaging DG CHEST PORT 1 VIEW Result Date: 05/16/2023 CLINICAL DATA:  Fever.  History of open ankle fracture. EXAM: PORTABLE CHEST 1 VIEW COMPARISON:  Radiographs 05/13/2023 and 04/17/2023. FINDINGS: 0806 hours. Left IJ hemodialysis catheter is unchanged at the level of the superior cavoatrial junction. The lungs are clear. There is no pleural effusion or pneumothorax. Telemetry leads overlie the chest. The bones appear unchanged. IMPRESSION: No evidence of acute cardiopulmonary process. Stable  left IJ hemodialysis catheter. Electronically Signed   By: Carey Bullocks M.D.   On: 05/16/2023 12:02   DG Ankle Right Port Result Date: 05/15/2023 CLINICAL DATA:  Postop. EXAM: PORTABLE RIGHT ANKLE - 2 VIEW COMPARISON:  CT 05/13/2023 FINDINGS: Fusion of the distal tibia, talus and calcaneus with nail and screw fixation. Ghost tracks from prior hardware. External fixator has been removed. Unchanged alignment of medial malleolar fracture. Overlying splint material limits osseous and soft tissue fine detail. IMPRESSION: Fusion of the distal tibia, talus and calcaneus with nail and screw fixation. Electronically Signed   By: Narda Rutherford M.D.   On: 05/15/2023 13:17   DG Ankle Complete Right Result Date: 05/15/2023 CLINICAL DATA:  Ankle surgery.  Ankle arthrodesis. EXAM: RIGHT ANKLE - COMPLETE 3+ VIEW COMPARISON:  CT 05/13/2023 FINDINGS: Nine fluoroscopic spot views of the right ankle submitted from the operating room. The external fixator has been removed. Calcaneal, talar, tibial effusion with nail and locking screws. Fluoroscopy time 88.7 seconds. Dose 1.3 mGy IMPRESSION: Intraoperative fluoroscopy during ankle arthrodesis. Electronically Signed   By: Narda Rutherford M.D.   On: 05/15/2023 11:16   DG C-Arm 1-60 Min-No Report Result Date: 05/15/2023 Fluoroscopy was utilized by the requesting physician.  No radiographic interpretation.   DG Ribs Unilateral W/Chest Right Result Date: 05/13/2023 CLINICAL DATA:  Rib pain EXAM: RIGHT RIBS AND CHEST - 3+ VIEW COMPARISON:  Chest x-ray 04/17/2023 FINDINGS: No fracture or other bone lesions are seen involving the ribs. There is no evidence of pneumothorax or pleural effusion. Both lungs are clear. The heart is enlarged. Left-sided central venous catheter tip projects over the distal SVC. IMPRESSION: 1. No evidence of rib fracture. 2. Cardiomegaly. Electronically Signed   By: Darliss Cheney M.D.   On: 05/13/2023 15:56   CT ANKLE RIGHT WO CONTRAST Result Date:  05/13/2023 CLINICAL DATA:  One day postop following irrigation and debridement with external fixation of right trimalleolar ankle fracture EXAM: CT OF THE RIGHT ANKLE WITHOUT CONTRAST TECHNIQUE: Multidetector CT imaging of the right ankle was performed according to the standard protocol. Multiplanar CT image reconstructions were also generated. RADIATION DOSE REDUCTION: This exam was performed according to the departmental dose-optimization program which includes automated exposure control, adjustment of the mA and/or kV according to patient size and/or use of iterative reconstruction technique. COMPARISON:  X-ray 05/12/2023, CT 03/30/2023 FINDINGS: Bones/Joint/Cartilage Status post external fixation of right ankle fracture with hardware spanning the calcaneal body and distal tibial diaphysis. Mildly displaced fracture of the distal fibular diaphysis with mild displacement. There  is bony callus formation along the fracture site. Prior hardware removal of the distal fibula. Mildly distracted fracture through the base of the medial malleolus. Posterior malleolar fracture is posteriorly displaced by 7 mm. Evidence of prior hardware removal in the distal tibia as well as removal of prior subtalar hardware. Progressive tibiotalar joint space loss. Tibiotalar joint effusion with multiple foci of intra-articular air, likely related to recent surgery. Bones are demineralized, likely secondary to disuse. Alignment of the hindfoot and midfoot is maintained. Ligaments Suboptimally assessed by CT. Muscles and Tendons Tibialis posterior tendon is ill-defined. Generalized muscle atrophy. Soft tissues Diffuse soft tissue swelling about the ankle. There is air in the soft tissues at the medial aspect of the ankle, presumably related to prior surgery. Superimposed infection not excluded. IMPRESSION: 1. Status post external fixation of trimalleolar right ankle fracture. 2. Progressive tibiotalar joint space loss. 3. Diffuse soft  tissue swelling about the ankle. There is air in the soft tissues at the medial aspect of the ankle, presumably related to prior surgery. Superimposed infection not excluded. 4. Tibialis posterior tendon is ill-defined. Electronically Signed   By: Duanne Guess D.O.   On: 05/13/2023 12:51   DG Ankle Right Port Result Date: 05/12/2023 CLINICAL DATA:  Status post external fixator placement EXAM: PORTABLE RIGHT ANKLE - 2 VIEW COMPARISON:  Intraoperative films from earlier in the same day. FINDINGS: External fixator is noted in place. Fibular and distal tibial fractures are seen. No new focal abnormality is noted. IMPRESSION: Stable external fixator. Electronically Signed   By: Alcide Clever M.D.   On: 05/12/2023 22:12   DG Tibia/Fibula Right Result Date: 05/12/2023 CLINICAL DATA:  External fixator placement EXAM: RIGHT TIBIA AND FIBULA - 2 VIEW COMPARISON:  Films from earlier in the same day. FLUOROSCOPY TIME:  Radiation Exposure Index (as provided by the fluoroscopic device): 0.57 mGy If the device does not provide the exposure index: Fluoroscopy Time:  14 seconds Number of Acquired Images:  3 FINDINGS: Initial images demonstrate the prior fibular fracture and removed surgical hardware. Medial malleolar fragments are again noted similar to that seen on the prior exam. External fixator is then placed. Posterior malleolar fracture is noted as well. IMPRESSION: Placement of external fixator. Electronically Signed   By: Alcide Clever M.D.   On: 05/12/2023 22:12   DG C-Arm 1-60 Min-No Report Result Date: 05/12/2023 Fluoroscopy was utilized by the requesting physician.  No radiographic interpretation.   DG Ankle Complete Right Result Date: 05/12/2023 CLINICAL DATA:  sp fall, open fracture; sp fall with open fracture. EXAM: RIGHT ANKLE - COMPLETE 3+ VIEW; RIGHT TIBIA AND FIBULA - 2 VIEW COMPARISON:  None Available. FINDINGS: There is comminuted and displaced fracture of the lower diaphysis of the fibula. There  are ghost tracks in the fibula from previously removed hardware. There is faint ossific density surrounding the fracture site which may represent underlying heterotopic ossification. There are also age indeterminate fractures of the medial malleolus and posterior malleolus. No other acute fracture or dislocation. No aggressive osseous lesion. Ankle mortise appears intact. Mild-to-moderate diffuse soft tissue swelling overlying the medial malleolus. Vascular stent noted along the lower third femur. No radiopaque foreign bodies. IMPRESSION: *Comminuted and displaced fracture of the lower diaphysis of the fibula. *Age indeterminate fractures of the medial and posterior malleoli. Electronically Signed   By: Jules Schick M.D.   On: 05/12/2023 12:01   DG Tibia/Fibula Right Result Date: 05/12/2023 CLINICAL DATA:  sp fall, open fracture; sp fall with open fracture.  EXAM: RIGHT ANKLE - COMPLETE 3+ VIEW; RIGHT TIBIA AND FIBULA - 2 VIEW COMPARISON:  None Available. FINDINGS: There is comminuted and displaced fracture of the lower diaphysis of the fibula. There are ghost tracks in the fibula from previously removed hardware. There is faint ossific density surrounding the fracture site which may represent underlying heterotopic ossification. There are also age indeterminate fractures of the medial malleolus and posterior malleolus. No other acute fracture or dislocation. No aggressive osseous lesion. Ankle mortise appears intact. Mild-to-moderate diffuse soft tissue swelling overlying the medial malleolus. Vascular stent noted along the lower third femur. No radiopaque foreign bodies. IMPRESSION: *Comminuted and displaced fracture of the lower diaphysis of the fibula. *Age indeterminate fractures of the medial and posterior malleoli. Electronically Signed   By: Jules Schick M.D.   On: 05/12/2023 12:01   IR Removal Tun Cv Cath W/O FL Result Date: 05/05/2023 INDICATION: 68 year old male with recent osteomyelitis s/p IV  antibiotic therapy via right tunneled central venous catheter placed 04/07/23. Catheter no longer needed. EXAM: REMOVAL TUNNELED CENTRAL VENOUS CATHETER MEDICATIONS: None ANESTHESIA/SEDATION: None FLUOROSCOPY: None COMPLICATIONS: None immediate. PROCEDURE: Informed written consent was obtained from the patient after a thorough discussion of the procedural risks, benefits and alternatives. All questions were addressed. Maximal Sterile Barrier Technique was utilized including caps, mask, sterile gowns, sterile gloves, sterile drape, hand hygiene and skin antiseptic. A timeout was performed prior to the initiation of the procedure. The patient's right chest and catheter was prepped and draped in a normal sterile fashion. Heparin was removed from both ports of catheter. 1% lidocaine was used for local anesthesia. Using manual traction the cuff of the catheter was exposed and the catheter was removed in it's entirety. Pressure was held till hemostasis was obtained. A sterile dressing was applied. The patient tolerated the procedure well with no immediate complications. IMPRESSION: Successful catheter removal as described above. Performed by: Loyce Dys PA-C Electronically Signed   By: Corlis Leak M.D.   On: 05/05/2023 16:05   CT HEAD WO CONTRAST ( ) Result Date: 04/29/2023 CLINICAL DATA:  68 year old male with vertigo. EXAM: CT HEAD WITHOUT CONTRAST TECHNIQUE: Contiguous axial images were obtained from the base of the skull through the vertex without intravenous contrast. RADIATION DOSE REDUCTION: This exam was performed according to the departmental dose-optimization program which includes automated exposure control, adjustment of the mA and/or kV according to patient size and/or use of iterative reconstruction technique. COMPARISON:  Brain MRI and head CT 04/19/2023 FINDINGS: Brain: No midline shift, ventriculomegaly, mass effect, evidence of mass lesion, intracranial hemorrhage or evidence of cortically based  acute infarction. Gray-white differentiation stable and within normal limits for age. Vascular: No suspicious intracranial vascular hyperdensity. Extensive calcified atherosclerosis at the skull base. Skull: No acute osseous abnormality identified. Partially visible advanced upper cervical spine degeneration. Sinuses/Orbits: Visualized paranasal sinuses and mastoids are stable and well aerated. Tympanic cavities appear clear. Other: Stable orbit and scalp soft tissues. Some calcified scalp vessel atherosclerosis. IMPRESSION: 1. Stable and negative for age noncontrast CT appearance of the Brain. 2. Advanced calcified atherosclerosis. Electronically Signed   By: Odessa Fleming M.D.   On: 04/29/2023 15:41   IR Fluoro Guide CV Line Left Result Date: 04/22/2023 INDICATION: 68 year old with acute kidney injury. Patient needs a dialysis catheter. EXAM: FLUOROSCOPIC AND ULTRASOUND GUIDED PLACEMENT OF A TUNNELED DIALYSIS CATHETER Physician: Rachelle Hora. Lowella Dandy, MD MEDICATIONS: Vancomycin 1 g; The antibiotic was administered within an appropriate time interval prior to skin puncture. ANESTHESIA/SEDATION: Moderate (conscious) sedation  was employed during this procedure. A total of Versed 1.0mg  and fentanyl 50 mcg was administered intravenously at the order of the provider performing the procedure. Total intra-service moderate sedation time: 20 minutes. Patient's level of consciousness and vital signs were monitored continuously by radiology nurse throughout the procedure under the supervision of the provider performing the procedure. FLUOROSCOPY TIME:  Radiation Exposure Index (as provided by the fluoroscopic device): 8 mGy Kerma COMPLICATIONS: None immediate. PROCEDURE: Informed consent was obtained for placement of a tunneled dialysis catheter. The patient was placed supine on the interventional table. Ultrasound confirmed a patent left internal jugular vein. Ultrasound image obtained for documentation. The left neck and chest was  prepped and draped in a sterile fashion. Maximal barrier sterile technique was utilized including caps, mask, sterile gowns, sterile gloves, sterile drape, hand hygiene and skin antiseptic. The left neck was anesthetized with 1% lidocaine. A small incision was made with #11 blade scalpel. A 21 gauge needle directed into the left internal jugular vein with ultrasound guidance. A micropuncture dilator set was placed. A 28 cm tip to cuff Palindrome catheter was selected. The skin below the left clavicle was anesthetized and a small incision was made with an #11 blade scalpel. A subcutaneous tunnel was formed to the vein dermatotomy site. The catheter was brought through the tunnel. The vein dermatotomy site was dilated to accommodate a peel-away sheath over a wire. The catheter was placed through the peel-away sheath and directed into the central venous structures. The tip of the catheter was placed at superior cavoatrial junction with fluoroscopy. Fluoroscopic images were obtained for documentation. Both lumens were found to aspirate and flush well. The proper amount of heparin was flushed in both lumens. The vein dermatotomy site was closed using a single layer of absorbable suture and Dermabond. The catheter was secured to the skin using Prolene suture. IMPRESSION: Successful placement of a left jugular tunneled dialysis catheter using ultrasound and fluoroscopic guidance. Electronically Signed   By: Richarda Overlie M.D.   On: 04/22/2023 15:05   IR US Guide Vasc Access Left Result Date: 04/22/2023 INDICATION: 55 year old with acute kidney injury. Patient needs a dialysis catheter. EXAM: FLUOROSCOPIC AND ULTRASOUND GUIDED PLACEMENT OF A TUNNELED DIALYSIS CATHETER Physician: Rachelle Hora. Lowella Dandy, MD MEDICATIONS: Vancomycin 1 g; The antibiotic was administered within an appropriate time interval prior to skin puncture. ANESTHESIA/SEDATION: Moderate (conscious) sedation was employed during this procedure. A total of Versed 1.0mg   and fentanyl 50 mcg was administered intravenously at the order of the provider performing the procedure. Total intra-service moderate sedation time: 20 minutes. Patient's level of consciousness and vital signs were monitored continuously by radiology nurse throughout the procedure under the supervision of the provider performing the procedure. FLUOROSCOPY TIME:  Radiation Exposure Index (as provided by the fluoroscopic device): 8 mGy Kerma COMPLICATIONS: None immediate. PROCEDURE: Informed consent was obtained for placement of a tunneled dialysis catheter. The patient was placed supine on the interventional table. Ultrasound confirmed a patent left internal jugular vein. Ultrasound image obtained for documentation. The left neck and chest was prepped and draped in a sterile fashion. Maximal barrier sterile technique was utilized including caps, mask, sterile gowns, sterile gloves, sterile drape, hand hygiene and skin antiseptic. The left neck was anesthetized with 1% lidocaine. A small incision was made with #11 blade scalpel. A 21 gauge needle directed into the left internal jugular vein with ultrasound guidance. A micropuncture dilator set was placed. A 28 cm tip to cuff Palindrome catheter was selected. The skin  below the left clavicle was anesthetized and a small incision was made with an #11 blade scalpel. A subcutaneous tunnel was formed to the vein dermatotomy site. The catheter was brought through the tunnel. The vein dermatotomy site was dilated to accommodate a peel-away sheath over a wire. The catheter was placed through the peel-away sheath and directed into the central venous structures. The tip of the catheter was placed at superior cavoatrial junction with fluoroscopy. Fluoroscopic images were obtained for documentation. Both lumens were found to aspirate and flush well. The proper amount of heparin was flushed in both lumens. The vein dermatotomy site was closed using a single layer of absorbable  suture and Dermabond. The catheter was secured to the skin using Prolene suture. IMPRESSION: Successful placement of a left jugular tunneled dialysis catheter using ultrasound and fluoroscopic guidance. Electronically Signed   By: Richarda Overlie M.D.   On: 04/22/2023 15:05    I have personally spent 25  minutes involved in face-to-face and non-face-to-face activities for this patient on the day of the visit. Professional time spent includes the following activities: Preparing to see the patient (review of tests), Obtaining and/or reviewing separately obtained history (admission/discharge record), Performing a medically appropriate examination and/or evaluation , Ordering medications/tests/procedures, referring and communicating with other health care professionals, Documenting clinical information in the EMR, Independently interpreting results (not separately reported), Communicating results to the patient/family/caregiver, Counseling and educating the patient/family/caregiver and Care coordination (not separately reported).    Marcos Eke, NP [facility]  05/19/2023  10:35 AM

## 2023-05-19 NOTE — Progress Notes (Addendum)
 [facility] PROGRESS NOTE    Progress Note  Harold [name]  WUJ:[id number] DOB: [birth year] DOA: 05/12/2023 PCP: [name], MD     Brief Narrative:   Harold [name] is an 68 y.o. male past medical history of morbid obesity, obstructive sleep apnea diabetes mellitus type 2 on A1c, CAD with a history of stent, atrial fibrillation on Eliquis, carotid stenosis, chronic kidney disease stage IIIb, diabetic peripheral neuropathy, hypothyroidism recently discharged on 05/10/2018 for comes in for right ankle pain BTK surgery was consulted underwent external fixation of the right ankle with closed reduction, plan to repeat surgery on 05/15/2023  Assessment/Plan:   Open right ankle fracture, sequela: On November 2024 underwent ORIF for ankle fracture, with revision in December 2024 for broken hardware.  04/02/2023 underwent I&D by Dr. Lajoyce Corners. 04/28/2023 underwent hardware removal due to infection. Now readmitted on 05/12/2023 for external fixation of the right ankle on the same day. Status post right ankle arthrodesis, right subtalar arthrodesis and removal of external fixation on 05/15/2023. Continue narcotics, anticoagulation and weightbearing per Ortho. PT evaluated the patient, TOC helping with disposition.  They relate [facility] probably an [facility].  Bacteremia Enterobacter species: Tmax 98.1 no leukocytosis. Blood cultures growing Enterobacter. Continue IV ciprofloxacin. Will need 4 weeks of antibiotics HD catheter discontinued, he was discussed with renal.  Acute postoperative blood loss anemia/chronic kidney disease IV: Status post 2 unit packed red blood cells hemoglobin this morning 7.5. Will transfuse an additional an additional unit of PRBC. CBC post transfusional. Keep hemoglobin above 8 due to coronary artery disease. Hemoglobin appears to be relatively stable.  Chronic renal disease, stage IV (HCC) Renal function is stable, his baseline is around 3.2. He  required HD during last admission.   His renal function is better than his baseline as it continues to drift down. Discussed with renal will discontinue HD catheter.  Elevated blood pressure: He is on midodrine will titrate down, is a good chance we could discharge him without midodrine. Will continue to titrate down to monitor blood pressure.  Hypomagnesemia: repleted orally magnesium 1.8. Continue oral supplementation.  Paroxysmal atrial fibrillation: Currently on amiodarone, and Eliquis Try to keep potassium greater than 4 magnesium greater than 2.  No events on telemetry.  CAD, LAD/CFX DES 04/28/12- staged RCA DES 04/29/12 after pt declined CABG, cath 05/14/13  stable CAD medical therapy: Continue Plavix denies chest pain or shortness of breath. Also on Eliquis.  Struct of sleep apnea: Continue CPAP.  Anxiety and depression:  continue Cymbalta.  Chronic hypotension: Continue midodrine.  OSA on CPAP Continue CPAP.  Acquired hypothyroidism Continue Synthroid.  Uncontrolled type 2 diabetes mellitus with hyperglycemia, with long-term current use of insulin: Blood glucose significantly elevated. Last A1c of 7. Cont. long-acting insulin, continue sliding scale insulin.  Chronic gout: Continue colchicine.  Morbid obesity: Noted  RN Pressure Injury Documentation: Pressure Injury 04/17/23 Ankle Right;Anterior Deep Tissue Pressure Injury - Purple or maroon localized area of discolored intact skin or blood-filled blister due to damage of underlying soft tissue from pressure and/or shear. (Active)  04/17/23 2040  Location: Ankle  Location Orientation: Right;Anterior  Staging: Deep Tissue Pressure Injury - Purple or maroon localized area of discolored intact skin or blood-filled blister due to damage of underlying soft tissue from pressure and/or shear.  Wound Description (Comments):   Present on Admission: Yes  Dressing Type Compression wrap 05/18/23 1931     Pressure  Injury 05/17/23 Buttocks Left Stage 2 -  Partial thickness loss of  dermis presenting as a shallow open injury with a red, pink wound bed without slough. st 2 pressure ulcer, (Active)  05/17/23 1945  Location: Buttocks  Location Orientation: Left  Staging: Stage 2 -  Partial thickness loss of dermis presenting as a shallow open injury with a red, pink wound bed without slough.  Wound Description (Comments): st 2 pressure ulcer,  Present on Admission: No  Dressing Type Foam - Lift dressing to assess site every shift 05/18/23 1931     Pressure Injury 05/17/23 Right Stage 2 -  Partial thickness loss of dermis presenting as a shallow open injury with a red, pink wound bed without slough. st 2 inner buttocks (Active)  05/17/23 1945  Location:   Location Orientation: Right  Staging: Stage 2 -  Partial thickness loss of dermis presenting as a shallow open injury with a red, pink wound bed without slough.  Wound Description (Comments): st 2 inner buttocks  Present on Admission: No  Dressing Type Foam - Lift dressing to assess site every shift 05/18/23 1931      DVT prophylaxis: Lovenox Family Communication:none Status is: Inpatient Remains inpatient appropriate because: Open right ankle fracture    Code Status:     Code Status Orders  (From admission, onward)           Start     Ordered   05/12/23 1338  Full code  Continuous       Question:  By:  Answer:  Other   05/12/23 1343           Code Status History     Date Active Date Inactive Code Status Order ID Comments User Context   03/27/2023 2243 05/10/2023 1618 Full Code [id number]  Angie Fava, DO ED   01/09/2023 0555 01/14/2023 1612 Full Code [id number]  Dolly Rias, MD ED   01/02/2023 0828 01/07/2023 1852 Full Code [id number]  Clydie Braun, MD ED   01/02/2023 0813 01/02/2023 0828 Full Code [id number]  Clydie Braun, MD ED   11/06/2022 1411 11/07/2022 2159 Full Code [id number]  Iran Ouch, MD Inpatient    07/26/2020 1434 07/27/2020 0140 Full Code [id number]  Coletta Memos, MD Inpatient   05/14/2013 1827 05/15/2013 1657 Full Code [id number]  Lennette Bihari, MD Inpatient   05/13/2013 2244 05/14/2013 1827 Full Code [id number]  Barrett, Joline Salt, PA-C Inpatient      Advance Directive Documentation    Flowsheet Row Most Recent Value  Type of Advance Directive Healthcare Power of Attorney, Living will  Pre-existing out of facility DNR order (yellow form or pink MOST form) --  "MOST" Form in Place? --         IV Access:   Peripheral IV   Procedures and diagnostic studies:   No results found.    Medical Consultants:   None.   Subjective:    Rabon Scholle Benton-Elliot no complaints.  Objective:    Vitals:   05/18/23 1938 05/18/23 2325 05/19/23 0421 05/19/23 0500  BP: (!) 144/60  (!) 154/64   Pulse: 67  62   Resp:  (!) 24    Temp: 99 F (37.2 C)  98.1 F (36.7 C)   TempSrc: Oral  Oral   SpO2: 96% 97% 100%   Weight:    126.3 kg  Height:       SpO2: 100 % O2 Flow Rate (L/min): 2 L/min FiO2 (%): 36 %   Intake/Output Summary (Last 24 hours) at 05/19/2023 7564 Last data filed at  05/19/2023 0400 Gross per 24 hour  Intake 400 ml  Output 540 ml  Net -140 ml   Filed Weights   05/15/23 0500 05/18/23 0515 05/19/23 0500  Weight: 129.2 kg 127.4 kg 126.3 kg    Exam: General exam: In no acute distress. Respiratory system: Good air movement and clear to auscultation. Cardiovascular system: S1 & S2 heard, RRR. No JVD. Gastrointestinal system: Abdomen is nondistended, soft and nontender.  Extremities: No pedal edema. Skin: No rashes, lesions or ulcers Psychiatry: Judgement and insight appear normal. Mood & affect appropriate. Data Reviewed:    Labs: Basic Metabolic Panel: Recent Labs  Lab 05/13/23 0648 05/14/23 9604 05/15/23 0543 05/16/23 0520 05/17/23 0917 05/18/23 0809  NA 135 132* 132* 131* 130* 131*  K 4.4 4.3 3.7 4.0 3.7 3.2*  CL 101 100 97* 98 94* 97*  CO2 24  24 24 25 24 23   GLUCOSE 252* 217* 192* 224* 194* 215*  BUN 30* 28* 28* 29* 32* 30*  CREATININE 3.68* 3.42* 3.07* 3.23* 3.10* 2.83*  CALCIUM 8.2* 8.3* 8.5* 7.8* 8.5* 8.5*  MG 1.4* 1.7 1.8 1.8  --   --   PHOS 4.0  --   --   --   --   --    GFR Estimated Creatinine Clearance: 34.3 mL/min (A) (by C-G formula based on SCr of 2.83 mg/dL (H)). Liver Function Tests: Recent Labs  Lab 05/13/23 0648  AST 11*  ALT 9  ALKPHOS 73  BILITOT 0.6  PROT 5.7*  ALBUMIN 2.6*   No results for input(s): "LIPASE", "AMYLASE" in the last 168 hours. No results for input(s): "AMMONIA" in the last 168 hours. Coagulation profile Recent Labs  Lab 05/12/23 0947  INR 1.5*   COVID-19 Labs  No results for input(s): "DDIMER", "FERRITIN", "LDH", "CRP" in the last 72 hours.  Lab Results  Component Value Date   SARSCOV2NAA NEGATIVE 03/27/2023   SARSCOV2NAA NEGATIVE 07/24/2020   SARSCOV2NAA NEGATIVE 10/09/2019    CBC: Recent Labs  Lab 05/14/23 0627 05/15/23 0543 05/16/23 0520 05/17/23 0441 05/18/23 0809  WBC 6.8 5.1 7.1 7.5 6.8  HGB 7.9* 7.6* 8.3* 8.0* 7.7*  HCT 23.9* 23.0* 24.9* 23.4* 22.9*  MCV 90.5 89.1 88.9 88.3 87.7  PLT 122* 141* 177 176 181   Cardiac Enzymes: No results for input(s): "CKTOTAL", "CKMB", "CKMBINDEX", "TROPONINI" in the last 168 hours. BNP (last 3 results) No results for input(s): "PROBNP" in the last 8760 hours. CBG: Recent Labs  Lab 05/18/23 0515 05/18/23 0905 05/18/23 1120 05/18/23 1620 05/18/23 2104  GLUCAP 205* 198* 206* 172* 183*   D-Dimer: No results for input(s): "DDIMER" in the last 72 hours. Hgb A1c: No results for input(s): "HGBA1C" in the last 72 hours. Lipid Profile: No results for input(s): "CHOL", "HDL", "LDLCALC", "TRIG", "CHOLHDL", "LDLDIRECT" in the last 72 hours. Thyroid function studies: No results for input(s): "TSH", "T4TOTAL", "T3FREE", "THYROIDAB" in the last 72 hours.  Invalid input(s): "FREET3" Anemia work up: No results for  input(s): "VITAMINB12", "FOLATE", "FERRITIN", "TIBC", "IRON", "RETICCTPCT" in the last 72 hours. Sepsis Labs: Recent Labs  Lab 05/15/23 0543 05/16/23 0009 05/16/23 0520 05/17/23 0441 05/18/23 0809  PROCALCITON  --   --  1.51  --   --   WBC 5.1  --  7.1 7.5 6.8  LATICACIDVEN  --  1.0  --   --   --    Microbiology Recent Results (from the past 240 hours)  Surgical pcr screen     Status: None   Collection  Time: 05/12/23  5:04 PM   Specimen: Nasal Mucosa; Nasal Swab  Result Value Ref Range Status   MRSA, PCR NEGATIVE NEGATIVE Final   Staphylococcus aureus NEGATIVE NEGATIVE Final    Comment: (NOTE) The Xpert SA Assay (FDA approved for NASAL specimens in patients 31 years of age and older), is one component of a comprehensive surveillance program. It is not intended to diagnose infection nor to guide or monitor treatment. Performed at [facility], [address], [place], [place] [address]   Culture, blood (Routine X 2) w Reflex to ID Panel     Status: Abnormal   Collection Time: 05/16/23  2:06 AM   Specimen: BLOOD RIGHT ARM  Result Value Ref Range Status   Specimen Description BLOOD RIGHT ARM  Final   Special Requests   Final    BOTTLES DRAWN AEROBIC AND ANAEROBIC Blood Culture results may not be optimal due to an inadequate volume of blood received in culture bottles   Culture  Setup Time   Final    GRAM NEGATIVE RODS IN BOTH AEROBIC AND ANAEROBIC BOTTLES CRITICAL RESULT CALLED TO, READ BACK BY AND VERIFIED WITH: Neil Crouch [id number] AT 1741 BY EC Performed at [facility], [address], [place], [place] [address]    Culture ENTEROBACTER CLOACAE (A)  Final   Report Status 05/18/2023 FINAL  Final   Organism ID, Bacteria ENTEROBACTER CLOACAE  Final      Susceptibility   Enterobacter cloacae - MIC*    CEFEPIME <=0.12 SENSITIVE Sensitive     CEFTAZIDIME <=1 SENSITIVE Sensitive     CIPROFLOXACIN <=0.25 SENSITIVE Sensitive     GENTAMICIN <=1 SENSITIVE  Sensitive     IMIPENEM 0.5 SENSITIVE Sensitive     TRIMETH/SULFA <=20 SENSITIVE Sensitive     PIP/TAZO <=4 SENSITIVE Sensitive ug/mL    * ENTEROBACTER CLOACAE  Blood Culture ID Panel (Reflexed)     Status: Abnormal   Collection Time: 05/16/23  2:06 AM  Result Value Ref Range Status   Enterococcus faecalis NOT DETECTED NOT DETECTED Final   Enterococcus Faecium NOT DETECTED NOT DETECTED Final   Listeria monocytogenes NOT DETECTED NOT DETECTED Final   Staphylococcus species NOT DETECTED NOT DETECTED Final   Staphylococcus aureus (BCID) NOT DETECTED NOT DETECTED Final   Staphylococcus epidermidis NOT DETECTED NOT DETECTED Final   Staphylococcus lugdunensis NOT DETECTED NOT DETECTED Final   Streptococcus species NOT DETECTED NOT DETECTED Final   Streptococcus agalactiae NOT DETECTED NOT DETECTED Final   Streptococcus pneumoniae NOT DETECTED NOT DETECTED Final   Streptococcus pyogenes NOT DETECTED NOT DETECTED Final   A.calcoaceticus-baumannii NOT DETECTED NOT DETECTED Final   Bacteroides fragilis NOT DETECTED NOT DETECTED Final   Enterobacterales DETECTED (A) NOT DETECTED Final    Comment: Enterobacterales represent a large order of gram negative bacteria, not a single organism. CRITICAL RESULT CALLED TO, READ BACK BY AND VERIFIED WITH: PHARMD [name] BITONTI [id number] AT 1741 BY EC    Enterobacter cloacae complex DETECTED (A) NOT DETECTED Final    Comment: CRITICAL RESULT CALLED TO, READ BACK BY AND VERIFIED WITH: PHARMD [name] BITONTI [id number] AT 1741 BY EC    Escherichia coli NOT DETECTED NOT DETECTED Final   Klebsiella aerogenes NOT DETECTED NOT DETECTED Final   Klebsiella oxytoca NOT DETECTED NOT DETECTED Final   Klebsiella pneumoniae NOT DETECTED NOT DETECTED Final   Proteus species NOT DETECTED NOT DETECTED Final   Salmonella species NOT DETECTED NOT DETECTED Final   Serratia marcescens NOT  DETECTED NOT DETECTED Final   Haemophilus influenzae NOT DETECTED NOT DETECTED Final    Neisseria meningitidis NOT DETECTED NOT DETECTED Final   Pseudomonas aeruginosa NOT DETECTED NOT DETECTED Final   Stenotrophomonas maltophilia NOT DETECTED NOT DETECTED Final   Candida albicans NOT DETECTED NOT DETECTED Final   Candida auris NOT DETECTED NOT DETECTED Final   Candida glabrata NOT DETECTED NOT DETECTED Final   Candida krusei NOT DETECTED NOT DETECTED Final   Candida parapsilosis NOT DETECTED NOT DETECTED Final   Candida tropicalis NOT DETECTED NOT DETECTED Final   Cryptococcus neoformans/gattii NOT DETECTED NOT DETECTED Final   CTX-M ESBL NOT DETECTED NOT DETECTED Final   Carbapenem resistance IMP NOT DETECTED NOT DETECTED Final   Carbapenem resistance KPC NOT DETECTED NOT DETECTED Final   Carbapenem resistance NDM NOT DETECTED NOT DETECTED Final   Carbapenem resist OXA 48 LIKE NOT DETECTED NOT DETECTED Final   Carbapenem resistance VIM NOT DETECTED NOT DETECTED Final    Comment: Performed at [facility], [address] [place], [place] [address]  Culture, blood (Routine X 2) w Reflex to ID Panel     Status: None (Preliminary result)   Collection Time: 05/16/23  2:08 AM   Specimen: BLOOD RIGHT ARM  Result Value Ref Range Status   Specimen Description BLOOD RIGHT ARM  Final   Special Requests   Final    BOTTLES DRAWN AEROBIC AND ANAEROBIC Blood Culture adequate volume   Culture   Final    NO GROWTH 2 DAYS Performed at [facility], [address], [place], [place] [address]    Report Status PENDING  Incomplete     Medications:    sodium chloride   Intravenous Once   allopurinol  50 mg Oral Daily   amiodarone  200 mg Oral BID   apixaban  5 mg Oral BID   atorvastatin  40 mg Oral Daily   B-complex with vitamin C  1 tablet Oral Daily   Chlorhexidine Gluconate Cloth  6 each Topical Daily   clopidogrel  75 mg Oral q morning   colchicine  0.6 mg Oral QODAY   vitamin B-12  1,000 mcg Oral Daily   docusate sodium  100 mg Oral BID   DULoxetine  60 mg Oral  Daily   ezetimibe  10 mg Oral Daily   feeding supplement  237 mL Oral BID BM   ferrous sulfate  325 mg Oral Q breakfast   gabapentin  300 mg Oral QHS   Gerhardt's butt cream   Topical BID   insulin aspart  0-5 Units Subcutaneous QHS   insulin aspart  0-9 Units Subcutaneous TID WC   insulin aspart  2 Units Subcutaneous TID WC   insulin glargine-yfgn  10 Units Subcutaneous QHS   levothyroxine  88 mcg Oral Q0600   midodrine  5 mg Oral TID WC   sodium chloride flush  3 mL Intravenous Q12H   sodium chloride flush  3-10 mL Intravenous Q12H   Vitamin D (Ergocalciferol)  50,000 Units Oral Q7 days   Continuous Infusions:  ciprofloxacin 400 mg (05/19/23 0503)      LOS: 7 days   Marinda Elk  [facility]  05/19/2023, 7:06 AM

## 2023-05-19 NOTE — Plan of Care (Signed)

## 2023-05-19 NOTE — Plan of Care (Signed)
  Problem: Tissue Perfusion: Goal: Adequacy of tissue perfusion will improve Outcome: Not Progressing   Problem: Skin Integrity: Goal: Risk for impaired skin integrity will decrease Outcome: Not Progressing   Problem: Metabolic: Goal: Ability to maintain appropriate glucose levels will improve Outcome: Not Progressing   Problem: Safety: Goal: Ability to remain free from injury will improve Outcome: Not Progressing   Problem: Pain Managment: Goal: General experience of comfort will improve and/or be controlled Outcome: Not Progressing

## 2023-05-19 NOTE — Progress Notes (Signed)
 Physical Therapy Treatment Patient Details Name: Harold [name] MRN: [id number] DOB: 1955-02-21 Today's Date: 05/19/2023   History of Present Illness Pt admitted to [facility] 05/12/23 for R ankle pain. X-ray showed open ankle fx. Pt recently dc after 44 days in hospital. External fixation performed 3/24. S/p R ankle arthrodesis, right subtalar arthrodesis and removal of external fixation on 05/15/2023. Original R ankle fx in 11/24. PMH: HTN, HLD, CAD s/p PCI, COPD, DM2, CKD stage IIIb, PVD, gout, OSA, neuropathy, COPD, a-fib, CAD s/p percutaneous coronary angioplasty, angina, obesity    PT Comments  Pt endorsing some RLE pain and need to have a BM, otherwise pt agreeable to PT session focused on transfer training. Pt tolerating repeated sit<>stands and scooting EOB bidirectonally, though requires significant physical assist +2 to perform. Pt endorsing fatigue and feeling "loopy" secondary to pain medication, unable to progress OOB to chair this date. Pt remains motivated to progress. Will continue to follow.     If plan is discharge home, recommend the following: Assistance with cooking/housework;Assist for transportation;Help with stairs or ramp for entrance;Two people to help with walking and/or transfers;Two people to help with bathing/dressing/bathroom   Can travel by private vehicle        Equipment Recommendations  None recommended by PT    Recommendations for Other Services       Precautions / Restrictions Precautions Precautions: Fall Required Braces or Orthoses: Splint/Cast Splint/Cast: RLE splint Restrictions RLE Weight Bearing Per Provider Order:  (WB transfers only) Other Position/Activity Restrictions: pt allowed to Wb through toes to transfer only (orders states WB RLE transfers only, however PA verbally stated to pt he can only put weight through his toes)     Mobility  Bed Mobility Overal bed mobility: Needs Assistance Bed Mobility: Sit to Supine, Rolling, Sidelying  to Sit Rolling: Mod assist, Used rails Sidelying to sit: Mod assist, +2 for physical assistance, Used rails, HOB elevated   Sit to supine: Total assist, +2 for physical assistance   General bed mobility comments: assist for rolling bilat for truncal translation, LE translation. mod-total +2 assist for supine<>sit for trunk and LE management, more dependent returning to bed given fatigue and position towards footboard    Transfers Overall transfer level: Needs assistance Equipment used: 2 person hand held assist Transfers: Sit to/from Stand, Bed to chair/wheelchair/BSC Sit to Stand: From elevated surface, Mod assist, +2 physical assistance          Lateral/Scoot Transfers: Max assist, +2 physical assistance, From elevated surface General transfer comment: mod +2 for x2 stand at EOB, assist for power up, rise, steadying, and slow eccentric lower onto EOB. scoot both L and R, max +2 for hip translation with bed pad and sequencing    Ambulation/Gait               General Gait Details: nt   Stairs             Wheelchair Mobility     Tilt Bed    Modified Rankin (Stroke Patients Only)       Balance Overall balance assessment: Needs assistance Sitting-balance support: Bilateral upper extremity supported, Feet supported Sitting balance-Leahy Scale: Fair     Standing balance support: Bilateral upper extremity supported, During functional activity, Reliant on assistive device for balance Standing balance-Leahy Scale: Zero Standing balance comment: reliant on UE support  Communication Communication Communication: No apparent difficulties  Cognition Arousal: Alert Behavior During Therapy: WFL for tasks assessed/performed   PT - Cognitive impairments: Safety/Judgement, Problem solving                       PT - Cognition Comments: slower processing today, requires step-by-step cuing for mobility. Pt states his pain  medicine made him "loopy" Following commands: Intact Following commands impaired: Follows one step commands with increased time    Cueing Cueing Techniques: Verbal cues, Tactile cues  Exercises General Exercises - Lower Extremity Long Arc Quad: AROM, Right, 10 reps, Seated    General Comments General comments (skin integrity, edema, etc.): HR 70s during mobility. of note, pt with small BM at start of session with +bright red blood mixed with stool      Pertinent Vitals/Pain Pain Assessment Pain Assessment: Faces Faces Pain Scale: Hurts even more Pain Location: RLE Pain Descriptors / Indicators: Discomfort, Grimacing, Moaning Pain Intervention(s): Limited activity within patient's tolerance, Monitored during session, Repositioned    Home Living                          Prior Function            PT Goals (current goals can now be found in the care plan section) Acute Rehab PT Goals Patient Stated Goal: Return home confidently PT Goal Formulation: With patient Time For Goal Achievement: 05/27/23 Potential to Achieve Goals: Good Progress towards PT goals: Progressing toward goals    Frequency    Min 3X/week      PT Plan      Co-evaluation              AM-PAC PT "6 Clicks" Mobility   Outcome Measure  Help needed turning from your back to your side while in a flat bed without using bedrails?: A Lot Help needed moving from lying on your back to sitting on the side of a flat bed without using bedrails?: A Lot Help needed moving to and from a bed to a chair (including a wheelchair)?: Total Help needed standing up from a chair using your arms (e.g., wheelchair or bedside chair)?: Total Help needed to walk in hospital room?: Total Help needed climbing 3-5 steps with a railing? : Total 6 Click Score: 8    End of Session Equipment Utilized During Treatment: Gait belt Activity Tolerance: Patient limited by fatigue;Patient limited by pain Patient left:  in bed;with call bell/phone within reach;with bed alarm set Nurse Communication: Mobility status PT Visit Diagnosis: Other abnormalities of gait and mobility (R26.89);Muscle weakness (generalized) (M62.81);Repeated falls (R29.6)     Time: 1610-9604 PT Time Calculation (min) (ACUTE ONLY): 32 min  Charges:    $Therapeutic Activity: 23-37 mins PT General Charges $$ ACUTE PT VISIT: 1 Visit                     Marye Round, PT DPT Acute Rehabilitation Services Secure Chat Preferred  Office [phone]    Truddie Coco 05/19/2023, 4:39 PM

## 2023-05-19 NOTE — TOC Progression Note (Signed)
 Transition of Care [facility]) - Progression Note    Patient Details  Name: VENCIL BASNETT MRN: [id number] Date of Birth: [birth year]  [facility] CM/SW Contact  Lorri Frederick, LCSW Phone Number: 05/19/2023, 1:21 PM  Clinical Narrative:   Message from C[facility]: auth still pending, but she does expect decision at some point today.     Expected Discharge Plan: Skilled Nursing Facility Barriers to Discharge: Continued Medical Work up  Expected Discharge Plan and Services In-house Referral: Clinical Social Work     Living arrangements for the past 2 months: Single Family Home                                       Social Determinants of Health (SDOH) Interventions SDOH Screenings   Food Insecurity: No Food Insecurity (05/12/2023)  Housing: Low Risk  (05/12/2023)  Transportation Needs: No Transportation Needs (05/12/2023)  Utilities: Not At Risk (05/12/2023)  Social Connections: Moderately Integrated (05/12/2023)  Tobacco Use: Medium Risk (05/15/2023)    Readmission Risk Interventions     No data to display

## 2023-05-20 DIAGNOSIS — S82891D Other fracture of right lower leg, subsequent encounter for closed fracture with routine healing: Secondary | ICD-10-CM

## 2023-05-20 DIAGNOSIS — N184 Chronic kidney disease, stage 4 (severe): Secondary | ICD-10-CM | POA: Diagnosis not present

## 2023-05-20 DIAGNOSIS — I480 Paroxysmal atrial fibrillation: Secondary | ICD-10-CM

## 2023-05-20 DIAGNOSIS — E039 Hypothyroidism, unspecified: Secondary | ICD-10-CM

## 2023-05-20 DIAGNOSIS — S82891S Other fracture of right lower leg, sequela: Secondary | ICD-10-CM | POA: Diagnosis not present

## 2023-05-20 LAB — BASIC METABOLIC PANEL WITH GFR
Anion gap: 15 (ref 5–15)
BUN: 26 mg/dL — ABNORMAL HIGH (ref 8–23)
CO2: 19 mmol/L — ABNORMAL LOW (ref 22–32)
Calcium: 8.9 mg/dL (ref 8.9–10.3)
Chloride: 98 mmol/L (ref 98–111)
Creatinine, Ser: 2.62 mg/dL — ABNORMAL HIGH (ref 0.61–1.24)
GFR, Estimated: 26 mL/min — ABNORMAL LOW (ref >60)
Glucose, Bld: 183 mg/dL — ABNORMAL HIGH (ref 70–99)
Potassium: 3.6 mmol/L (ref 3.5–5.1)
Sodium: 132 mmol/L — ABNORMAL LOW (ref 135–145)

## 2023-05-20 LAB — GLUCOSE, CAPILLARY
Glucose-Capillary: 172 mg/dL — ABNORMAL HIGH (ref 70–99)
Glucose-Capillary: 128 mg/dL — ABNORMAL HIGH (ref 70–99)
Glucose-Capillary: 161 mg/dL — ABNORMAL HIGH (ref 70–99)
Glucose-Capillary: 156 mg/dL — ABNORMAL HIGH (ref 70–99)

## 2023-05-20 LAB — BPAM RBC
Blood Product Expiration Date: 202504272359
ISSUE DATE / TIME: 202503311748
Unit Type and Rh: 6200

## 2023-05-20 LAB — CBC
HCT: 25.4 % — ABNORMAL LOW (ref 39.0–52.0)
Hemoglobin: 8.6 g/dL — ABNORMAL LOW (ref 13.0–17.0)
MCH: 29.8 pg (ref 26.0–34.0)
MCHC: 33.9 g/dL (ref 30.0–36.0)
MCV: 87.9 fL (ref 80.0–100.0)
Platelets: 216 10*3/uL (ref 150–400)
RBC: 2.89 MIL/uL — ABNORMAL LOW (ref 4.22–5.81)
RDW: 15 % (ref 11.5–15.5)
WBC: 7.4 10*3/uL (ref 4.0–10.5)
nRBC: 0 % (ref 0.0–0.2)

## 2023-05-20 LAB — TYPE AND SCREEN
ABO/RH(D): A POS
Antibody Screen: NEGATIVE
Unit division: 0

## 2023-05-20 MED ORDER — METHOCARBAMOL 500 MG PO TABS: 500.0000 mg | ORAL_TABLET | Freq: Four times a day (QID) | ORAL | 0 refills | Status: DC | PRN

## 2023-05-20 MED ORDER — MIDODRINE HCL 5 MG PO TABS
2.5000 mg | ORAL_TABLET | Freq: Two times a day (BID) | ORAL | Status: DC
  Administered 2023-05-20: 2.5 mg via ORAL
  Filled 2023-05-20: qty 1

## 2023-05-20 MED ORDER — JUVEN PO PACK
1.0000 | PACK | Freq: Two times a day (BID) | ORAL | Status: DC
  Administered 2023-05-20 – 2023-05-21 (×2): 1 via ORAL
  Filled 2023-05-20 (×2): qty 1

## 2023-05-20 MED ORDER — OXYCODONE HCL 5 MG PO TABS: 5.0000 mg | ORAL_TABLET | ORAL | 0 refills | Status: DC | PRN

## 2023-05-20 NOTE — Progress Notes (Signed)
 Initial Nutrition Assessment  DOCUMENTATION CODES:   Obesity unspecified, Severe malnutrition in context of acute illness/injury  INTERVENTION:  -1 packet Juven BID, each packet provides 95 calories, 2.5 grams of protein (collagen), and 9.8 grams of carbohydrate (3 grams sugar); also contains 7 grams of L-arginine and L-glutamine, 300 mg vitamin C, 15 mg vitamin E, 1.2 mcg vitamin B-12, 9.5 mg zinc, 200 mg calcium, and 1.5 g  Calcium Beta-hydroxy-Beta-methylbutyrate to support wound healing  Ensure Enlive po BID, each supplement provides 350 kcal and 20 grams of protein.  NUTRITION DIAGNOSIS:   Severe Malnutrition related to acute illness as evidenced by energy intake < or equal to 50% for > or equal to 5 days, percent weight loss.  GOAL:   Patient will meet greater than or equal to 90% of their needs  MONITOR:   PO intake, Supplement acceptance  REASON FOR ASSESSMENT:   Malnutrition Screening Tool    ASSESSMENT:   PMH morbid obesity, obstructive sleep apnea diabetes mellitus type 2 on A1c, CAD, atrial fibrillation, carotid stenosis, chronic kidney disease stage IIIb, diabetic peripheral neuropathy, hypothyroidism. Admitted for R ankle pain and underwent external fixation of R ankle with repeat surgery on 3/27.  Intake: Nursing flowsheets document meal completions 0-50%.  Medications reviewed and include: B-complex, colace, ferrous sulfate, SSI 0-9 units TID, SSI 0-5 units HS, synthroid, Vit D  Labs reviewed: Sodium 132 L, A1c 7.0%, CBGs 128-193 x24h   Intake/Output Summary (Last 24 hours) at 05/20/2023 1445 Last data filed at 05/20/2023 0000 Gross per 24 hour  Intake 323 ml  Output 800 ml  Net -477 ml    Weights reviewed. Admit weight 136.1 kg. Current weight 126.3 kg. -19kg (13%) wt loss in 6 months. Significant per ASPEN.   NUTRITION - FOCUSED PHYSICAL EXAM:  Flowsheet Row Most Recent Value  Orbital Region No depletion  Upper Arm Region No depletion  Thoracic and  Lumbar Region No depletion  Buccal Region No depletion  Temple Region No depletion  Clavicle Bone Region No depletion  Clavicle and Acromion Bone Region No depletion  Scapular Bone Region No depletion  Dorsal Hand No depletion  Patellar Region No depletion  Anterior Thigh Region No depletion  Posterior Calf Region No depletion  Edema (RD Assessment) Moderate  Hair Reviewed  Eyes Reviewed  Mouth Reviewed  Skin Reviewed  Nails Reviewed       Diet Order:   Diet Order             Diet Heart Room service appropriate? Yes; Fluid consistency: Thin  Diet effective now                   EDUCATION NEEDS:   Education needs have been addressed  Skin:  Skin Assessment: Skin Integrity Issues: Skin Integrity Issues:: DTI, Stage II DTI: R ankle Stage II: R, L buttocks  Last BM:  4/1  Height:   Ht Readings from Last 1 Encounters:  05/12/23 6\' 1"  (1.854 m)    Weight:   Wt Readings from Last 1 Encounters:  05/19/23 126.3 kg    Ideal Body Weight:  83.6 kg  BMI:  Body mass index is 36.74 kg/m.  Estimated Nutritional Needs:   Kcal:  1500-1750  Protein:  130-150  Fluid:  >1.5 L or per MD  Kathrynn Speed, MPH, RD, LDN Clinical Dietitian Contact information can be found at [facility].

## 2023-05-20 NOTE — Progress Notes (Signed)
 [facility] PROGRESS NOTE    Progress Note  SINCLAIR ALLIGOOD  ZOX:[id number] DOB: Jan 27, 1956 DOA: 05/12/2023 PCP: [name], MD     Brief Narrative:   Harold [name] is an 68 y.o. male past medical history of morbid obesity, obstructive sleep apnea diabetes mellitus type 2 on A1c, CAD with a history of stent, atrial fibrillation on Eliquis, carotid stenosis, chronic kidney disease stage IIIb, diabetic peripheral neuropathy, hypothyroidism recently discharged on 05/10/2018 for comes in for right ankle pain BTK surgery was consulted underwent external fixation of the right ankle with closed reduction, plan to repeat surgery on 05/15/2023  Assessment/Plan:   Open right ankle fracture, sequela: On November 2024 underwent ORIF for ankle fracture, with revision in December 2024 for broken hardware.  04/02/2023 underwent I&D by Dr. Lajoyce Corners. 04/28/2023 underwent hardware removal due to infection. Now readmitted on 05/12/2023 for external fixation of the right ankle on the same day. Status post right ankle arthrodesis, right subtalar arthrodesis and removal of external fixation on 05/15/2023. Continue narcotics, anticoagulation and weightbearing per Ortho. PT evaluated the patient, TOC helping with disposition.  They relate [facility] is probably an option.  Bacteremia Enterobacter species: Tmax 98.1 no leukocytosis. Blood cultures growing Enterobacter. Continue oral antibiotics Will need 4 weeks of antibiotics EOT 06/12/2023. HD catheter discontinued, it was discussed with renal.  Acute postoperative blood loss anemia/chronic kidney disease IV: Status post 2 unit packed red blood cells hemoglobin this morning 7.5. Will transfuse an additional an additional unit of PRBC. CBC post transfusional. Keep hemoglobin above 8 due to coronary artery disease. Hemoglobin appears to be relatively stable.  Chronic renal disease, stage IV (HCC) Renal function is stable, his baseline is around  3.2. He required HD during last admission.   His renal function is better than his baseline as it continues to drift down. Discussed with renal will discontinue HD catheter.  Elevated blood pressure: Midodrine has been discontinued and his blood pressure has been elevated.  And is slowly coming down.  Hypomagnesemia: repleted orally magnesium 1.8. Continue oral supplementation.  Paroxysmal atrial fibrillation: Currently on amiodarone, and Eliquis Try to keep potassium greater than 4 magnesium greater than 2.   No events on telemetry.  CAD, LAD/CFX DES 04/28/12- staged RCA DES 04/29/12 after pt declined CABG, cath 05/14/13  stable CAD medical therapy: Continue Plavix denies chest pain or shortness of breath. Also on Eliquis.  Struct of sleep apnea: Continue CPAP.  Anxiety and depression:  continue Cymbalta.  Chronic hypotension: Midodrine discontinued as blood pressure is around 160s.  OSA on CPAP Continue CPAP.  Acquired hypothyroidism Continue Synthroid.  Uncontrolled type 2 diabetes mellitus with hyperglycemia, with long-term current use of insulin: Blood glucose significantly elevated. Last A1c of 7. Cont. long-acting insulin, continue sliding scale insulin.  Chronic gout: Continue colchicine.  Morbid obesity: Noted  RN Pressure Injury Documentation: Pressure Injury 04/17/23 Ankle Right;Anterior Deep Tissue Pressure Injury - Purple or maroon localized area of discolored intact skin or blood-filled blister due to damage of underlying soft tissue from pressure and/or shear. (Active)  04/17/23 2040  Location: Ankle  Location Orientation: Right;Anterior  Staging: Deep Tissue Pressure Injury - Purple or maroon localized area of discolored intact skin or blood-filled blister due to damage of underlying soft tissue from pressure and/or shear.  Wound Description (Comments):   Present on Admission: Yes  Dressing Type Compression wrap 05/19/23 2036     Pressure Injury  05/12/23 Buttocks Left Stage 2 -  Partial thickness loss of  dermis presenting as a shallow open injury with a red, pink wound bed without slough. st 2 pressure ulcer, (Active)  05/12/23 2030  Location: Buttocks  Location Orientation: Left  Staging: Stage 2 -  Partial thickness loss of dermis presenting as a shallow open injury with a red, pink wound bed without slough.  Wound Description (Comments): st 2 pressure ulcer,  Present on Admission: No  Dressing Type Foam - Lift dressing to assess site every shift 05/19/23 2036     Pressure Injury 05/17/23 Right Stage 2 -  Partial thickness loss of dermis presenting as a shallow open injury with a red, pink wound bed without slough. st 2 inner buttocks (Active)  05/17/23 1945  Location:   Location Orientation: Right  Staging: Stage 2 -  Partial thickness loss of dermis presenting as a shallow open injury with a red, pink wound bed without slough.  Wound Description (Comments): st 2 inner buttocks  Present on Admission: No  Dressing Type Foam - Lift dressing to assess site every shift 05/19/23 2036      DVT prophylaxis: Lovenox Family Communication:none Status is: Inpatient Remains inpatient appropriate because: Open right ankle fracture    Code Status:     Code Status Orders  (From admission, onward)           Start     Ordered   05/12/23 1338  Full code  Continuous       Question:  By:  Answer:  Other   05/12/23 1343           Code Status History     Date Active Date Inactive Code Status Order ID Comments User Context   03/27/2023 2243 05/10/2023 1618 Full Code [id number]  Angie Fava, DO ED   01/09/2023 0555 01/14/2023 1612 Full Code [id number]  Dolly Rias, MD ED   01/02/2023 0828 01/07/2023 1852 Full Code [id number]  Clydie Braun, MD ED   01/02/2023 0813 01/02/2023 0828 Full Code [id number]  Clydie Braun, MD ED   11/06/2022 1411 11/07/2022 2159 Full Code [id number]  Iran Ouch, MD Inpatient    07/26/2020 1434 07/27/2020 0140 Full Code [id number]  Coletta Memos, MD Inpatient   05/14/2013 1827 05/15/2013 1657 Full Code [id number]  Lennette Bihari, MD Inpatient   05/13/2013 2244 05/14/2013 1827 Full Code [id number]  Barrett, Joline Salt, PA-C Inpatient      Advance Directive Documentation    Flowsheet Row Most Recent Value  Type of Advance Directive Healthcare Power of Attorney, Living will  Pre-existing out of facility DNR order (yellow form or pink MOST form) --  "MOST" Form in Place? --         IV Access:   Peripheral IV   Procedures and diagnostic studies:   No results found.    Medical Consultants:   None.   Subjective:    Giancarlos Berendt Benton-Elliot no complaints.  Objective:    Vitals:   05/19/23 1954 05/19/23 2036 05/20/23 0013 05/20/23 0500  BP: (!) 169/65 (!) 160/55  (!) 159/73  Pulse: 68 70 67 66  Resp: 17 18 16    Temp:  98.1 F (36.7 C)  (!) 97.4 F (36.3 C)  TempSrc:  Oral  Oral  SpO2: 98% 99% 97% 98%  Weight:      Height:       SpO2: 98 % O2 Flow Rate (L/min): 2 L/min FiO2 (%): 21 %   Intake/Output Summary (Last 24 hours) at 05/20/2023 7564 Last data filed at  05/20/2023 0000 Gross per 24 hour  Intake 323 ml  Output 800 ml  Net -477 ml   Filed Weights   05/15/23 0500 05/18/23 0515 05/19/23 0500  Weight: 129.2 kg 127.4 kg 126.3 kg    Exam: General exam: In no acute distress. Respiratory system: Good air movement and clear to auscultation. Cardiovascular system: S1 & S2 heard, RRR. No JVD. Gastrointestinal system: Abdomen is nondistended, soft and nontender.  Extremities: No pedal edema. Skin: No rashes, lesions or ulcers Psychiatry: Judgement and insight appear normal. Mood & affect appropriate. Data Reviewed:    Labs: Basic Metabolic Panel: Recent Labs  Lab 05/14/23 0627 05/15/23 0543 05/16/23 1610 05/17/23 9604 05/18/23 0809 05/19/23 0759 05/20/23 0614  NA 132* 132* 131* 130* 131* 129* 132*  K 4.3 3.7 4.0 3.7 3.2* 3.2* 3.6   CL 100 97* 98 94* 97* 96* 98  CO2 24 24 25 24 23  21* 19*  GLUCOSE 217* 192* 224* 194* 215* 228* 183*  BUN 28* 28* 29* 32* 30* 27* 26*  CREATININE 3.42* 3.07* 3.23* 3.10* 2.83* 2.76* 2.62*  CALCIUM 8.3* 8.5* 7.8* 8.5* 8.5* 8.6* 8.9  MG 1.7 1.8 1.8  --   --   --   --    GFR Estimated Creatinine Clearance: 37.1 mL/min (A) (by C-G formula based on SCr of 2.62 mg/dL (H)). Liver Function Tests: No results for input(s): "AST", "ALT", "ALKPHOS", "BILITOT", "PROT", "ALBUMIN" in the last 168 hours.  No results for input(s): "LIPASE", "AMYLASE" in the last 168 hours. No results for input(s): "AMMONIA" in the last 168 hours. Coagulation profile No results for input(s): "INR", "PROTIME" in the last 168 hours.  COVID-19 Labs  Recent Labs    05/19/23 0759  CRP 23.2*    Lab Results  Component Value Date   SARSCOV2NAA NEGATIVE 03/27/2023   SARSCOV2NAA NEGATIVE 07/24/2020   SARSCOV2NAA NEGATIVE 10/09/2019    CBC: Recent Labs  Lab 05/16/23 0520 05/17/23 0441 05/18/23 0809 05/19/23 0759 05/20/23 0614  WBC 7.1 7.5 6.8 6.6 7.4  HGB 8.3* 8.0* 7.7* 7.5* 8.6*  HCT 24.9* 23.4* 22.9* 22.5* 25.4*  MCV 88.9 88.3 87.7 87.9 87.9  PLT 177 176 181 197 216   Cardiac Enzymes: No results for input(s): "CKTOTAL", "CKMB", "CKMBINDEX", "TROPONINI" in the last 168 hours. BNP (last 3 results) No results for input(s): "PROBNP" in the last 8760 hours. CBG: Recent Labs  Lab 05/18/23 2104 05/19/23 0827 05/19/23 1150 05/19/23 1628 05/19/23 2147  GLUCAP 183* 198* 179* 156* 160*   D-Dimer: No results for input(s): "DDIMER" in the last 72 hours. Hgb A1c: No results for input(s): "HGBA1C" in the last 72 hours. Lipid Profile: No results for input(s): "CHOL", "HDL", "LDLCALC", "TRIG", "CHOLHDL", "LDLDIRECT" in the last 72 hours. Thyroid function studies: No results for input(s): "TSH", "T4TOTAL", "T3FREE", "THYROIDAB" in the last 72 hours.  Invalid input(s): "FREET3" Anemia work up: No results  for input(s): "VITAMINB12", "FOLATE", "FERRITIN", "TIBC", "IRON", "RETICCTPCT" in the last 72 hours. Sepsis Labs: Recent Labs  Lab 05/16/23 0009 05/16/23 0520 05/17/23 0441 05/18/23 0809 05/19/23 0759 05/20/23 5409  PROCALCITON  --  1.51  --   --   --   --   WBC  --  7.1 7.5 6.8 6.6 7.4  LATICACIDVEN 1.0  --   --   --   --   --    Microbiology Recent Results (from the past 240 hours)  Surgical pcr screen     Status: None   Collection Time: 05/12/23  5:04  PM   Specimen: Nasal Mucosa; Nasal Swab  Result Value Ref Range Status   MRSA, PCR NEGATIVE NEGATIVE Final   Staphylococcus aureus NEGATIVE NEGATIVE Final    Comment: (NOTE) The Xpert SA Assay (FDA approved for NASAL specimens in patients 36 years of age and older), is one component of a comprehensive surveillance program. It is not intended to diagnose infection nor to guide or monitor treatment. Performed at [facility], [address] [place], [place] [address]   Culture, blood (Routine X 2) w Reflex to ID Panel     Status: Abnormal   Collection Time: 05/16/23  2:06 AM   Specimen: BLOOD RIGHT ARM  Result Value Ref Range Status   Specimen Description BLOOD RIGHT ARM  Final   Special Requests   Final    BOTTLES DRAWN AEROBIC AND ANAEROBIC Blood Culture results may not be optimal due to an inadequate volume of blood received in culture bottles   Culture  Setup Time   Final    GRAM NEGATIVE RODS IN BOTH AEROBIC AND ANAEROBIC BOTTLES CRITICAL RESULT CALLED TO, READ BACK BY AND VERIFIED WITH: Neil Crouch [id number] AT 1741 BY EC Performed at [facility], [address], [place], [place] [address]    Culture ENTEROBACTER CLOACAE (A)  Final   Report Status 05/18/2023 FINAL  Final   Organism ID, Bacteria ENTEROBACTER CLOACAE  Final      Susceptibility   Enterobacter cloacae - MIC*    CEFEPIME <=0.12 SENSITIVE Sensitive     CEFTAZIDIME <=1 SENSITIVE Sensitive     CIPROFLOXACIN <=0.25 SENSITIVE  Sensitive     GENTAMICIN <=1 SENSITIVE Sensitive     IMIPENEM 0.5 SENSITIVE Sensitive     TRIMETH/SULFA <=20 SENSITIVE Sensitive     PIP/TAZO <=4 SENSITIVE Sensitive ug/mL    * ENTEROBACTER CLOACAE  Blood Culture ID Panel (Reflexed)     Status: Abnormal   Collection Time: 05/16/23  2:06 AM  Result Value Ref Range Status   Enterococcus faecalis NOT DETECTED NOT DETECTED Final   Enterococcus Faecium NOT DETECTED NOT DETECTED Final   Listeria monocytogenes NOT DETECTED NOT DETECTED Final   Staphylococcus species NOT DETECTED NOT DETECTED Final   Staphylococcus aureus (BCID) NOT DETECTED NOT DETECTED Final   Staphylococcus epidermidis NOT DETECTED NOT DETECTED Final   Staphylococcus lugdunensis NOT DETECTED NOT DETECTED Final   Streptococcus species NOT DETECTED NOT DETECTED Final   Streptococcus agalactiae NOT DETECTED NOT DETECTED Final   Streptococcus pneumoniae NOT DETECTED NOT DETECTED Final   Streptococcus pyogenes NOT DETECTED NOT DETECTED Final   A.calcoaceticus-baumannii NOT DETECTED NOT DETECTED Final   Bacteroides fragilis NOT DETECTED NOT DETECTED Final   Enterobacterales DETECTED (A) NOT DETECTED Final    Comment: Enterobacterales represent a large order of gram negative bacteria, not a single organism. CRITICAL RESULT CALLED TO, READ BACK BY AND VERIFIED WITH: PHARMD [name] BITONTI [id number] AT 1741 BY EC    Enterobacter cloacae complex DETECTED (A) NOT DETECTED Final    Comment: CRITICAL RESULT CALLED TO, READ BACK BY AND VERIFIED WITH: PHARMD [name] BITONTI [id number] AT 1741 BY EC    Escherichia coli NOT DETECTED NOT DETECTED Final   Klebsiella aerogenes NOT DETECTED NOT DETECTED Final   Klebsiella oxytoca NOT DETECTED NOT DETECTED Final   Klebsiella pneumoniae NOT DETECTED NOT DETECTED Final   Proteus species NOT DETECTED NOT DETECTED Final   Salmonella species NOT DETECTED NOT DETECTED Final   Serratia marcescens NOT DETECTED NOT DETECTED Final  Haemophilus  influenzae NOT DETECTED NOT DETECTED Final   Neisseria meningitidis NOT DETECTED NOT DETECTED Final   Pseudomonas aeruginosa NOT DETECTED NOT DETECTED Final   Stenotrophomonas maltophilia NOT DETECTED NOT DETECTED Final   Candida albicans NOT DETECTED NOT DETECTED Final   Candida auris NOT DETECTED NOT DETECTED Final   Candida glabrata NOT DETECTED NOT DETECTED Final   Candida krusei NOT DETECTED NOT DETECTED Final   Candida parapsilosis NOT DETECTED NOT DETECTED Final   Candida tropicalis NOT DETECTED NOT DETECTED Final   Cryptococcus neoformans/gattii NOT DETECTED NOT DETECTED Final   CTX-M ESBL NOT DETECTED NOT DETECTED Final   Carbapenem resistance IMP NOT DETECTED NOT DETECTED Final   Carbapenem resistance KPC NOT DETECTED NOT DETECTED Final   Carbapenem resistance NDM NOT DETECTED NOT DETECTED Final   Carbapenem resist OXA 48 LIKE NOT DETECTED NOT DETECTED Final   Carbapenem resistance VIM NOT DETECTED NOT DETECTED Final    Comment: Performed at [facility], [address] [place], [place] [address]  Culture, blood (Routine X 2) w Reflex to ID Panel     Status: None (Preliminary result)   Collection Time: 05/16/23  2:08 AM   Specimen: BLOOD RIGHT ARM  Result Value Ref Range Status   Specimen Description BLOOD RIGHT ARM  Final   Special Requests   Final    BOTTLES DRAWN AEROBIC AND ANAEROBIC Blood Culture adequate volume   Culture   Final    NO GROWTH 3 DAYS Performed at [facility], [address] [place], [place] [address]    Report Status PENDING  Incomplete     Medications:    allopurinol  50 mg Oral Daily   amiodarone  200 mg Oral BID   apixaban  5 mg Oral BID   atorvastatin  40 mg Oral Daily   B-complex with vitamin C  1 tablet Oral Daily   Chlorhexidine Gluconate Cloth  6 each Topical Daily   ciprofloxacin  500 mg Oral BID   clopidogrel  75 mg Oral q morning   colchicine  0.6 mg Oral QODAY   vitamin B-12  1,000 mcg Oral Daily   docusate sodium   100 mg Oral BID   DULoxetine  60 mg Oral Daily   ezetimibe  10 mg Oral Daily   feeding supplement  237 mL Oral BID BM   ferrous sulfate  325 mg Oral Q breakfast   gabapentin  300 mg Oral QHS   Gerhardt's butt cream   Topical BID   insulin aspart  0-5 Units Subcutaneous QHS   insulin aspart  0-9 Units Subcutaneous TID WC   insulin aspart  2 Units Subcutaneous TID WC   insulin glargine-yfgn  10 Units Subcutaneous QHS   levothyroxine  88 mcg Oral Q0600   midodrine  2.5 mg Oral BID WC   sodium chloride flush  3 mL Intravenous Q12H   sodium chloride flush  3-10 mL Intravenous Q12H   Vitamin D (Ergocalciferol)  50,000 Units Oral Q7 days   Continuous Infusions:      LOS: 8 days   Marinda Elk  [facility]  05/20/2023, 8:49 AM

## 2023-05-20 NOTE — Progress Notes (Signed)
 Occupational Therapy Treatment Patient Details Name: Harold [name] MRN: [id number] DOB: [birth year] Today's Date: 05/20/2023   History of present illness Pt admitted to [facility] 05/12/23 for R ankle pain. X-ray showed open ankle fx. Pt recently dc after 44 days in hospital. External fixation performed 3/24. S/p R ankle arthrodesis, right subtalar arthrodesis and removal of external fixation on 05/15/2023. Original R ankle fx in 11/24. PMH: HTN, HLD, CAD s/p PCI, COPD, DM2, CKD stage IIIb, PVD, gout, OSA, neuropathy, COPD, a-fib, CAD s/p percutaneous coronary angioplasty, angina, obesity   OT comments  Pt progressing towards goals.  Per CSW plan is now for pt to d/c home. OT discussed d/c with pt and recommendations. Pt reporting fatigue, and requesting to engage in UB strengthening. Red theraband provided, and pt demo return of all exercises. Discussed with pt d/c home at w/c level and use of SB for functional transfers. Pt declining to complete functional transfers at this time. Education provided on compensatory strategies of for LB ADLs to reduce caregiver burden. Plan is for pt to d/c home with HHOT. Will continue to follow acutely.       If plan is discharge home, recommend the following:  A lot of help with bathing/dressing/bathroom;Assistance with cooking/housework;Help with stairs or ramp for entrance;Assist for transportation;Two people to help with walking and/or transfers   Equipment Recommendations  Other (comment) (Drop arm bariatric BSC)    Recommendations for Other Services      Precautions / Restrictions Precautions Precautions: Fall Recall of Precautions/Restrictions: Impaired Precaution/Restrictions Comments: Watch BP Required Braces or Orthoses: Splint/Cast Splint/Cast: RLE splint Restrictions Weight Bearing Restrictions Per Provider Order: Yes RUE Weight Bearing Per Provider Order: Touch down weight bearing Other Position/Activity Restrictions: pt allowed to Wb through  toes to transfer only (orders states WB RLE transfers only, however PA verbally stated to pt he can only put weight through his toes)       Mobility Bed Mobility Overal bed mobility: Needs Assistance             General bed mobility comments: Pt declined    Transfers Overall transfer level: Needs assistance       General transfer comment: pt declined     Balance Overall balance assessment: Needs assistance               ADL either performed or assessed with clinical judgement   ADL Overall ADL's : Needs assistance/impaired         General ADL Comments: Pt declining ADLs and mobility, reporting he would like to focus on UB strengthening    Extremity/Trunk Assessment Upper Extremity Assessment Upper Extremity Assessment: Overall WFL for tasks assessed   Lower Extremity Assessment Lower Extremity Assessment: Defer to PT evaluation        Vision   Vision Assessment?: No apparent visual deficits   Perception Perception Perception: Not tested   Praxis     Communication Communication Communication: No apparent difficulties   Cognition Arousal: Alert Behavior During Therapy: Flat affect Cognition: No apparent impairments         Following commands: Intact Following commands impaired: Follows one step commands with increased time      Cueing   Cueing Techniques: Verbal cues, Tactile cues  Exercises Exercises: General Upper Extremity General Exercises - Upper Extremity Shoulder Flexion: Strengthening, Both, 10 reps, Supine, Theraband Theraband Level (Shoulder Flexion): Level 2 (Red) Shoulder Horizontal ABduction: 10 reps, Strengthening, Both, Supine, Theraband Theraband Level (Shoulder Horizontal Abduction): Level 2 (Red) Elbow Extension: Strengthening,  10 reps, Both, Theraband Theraband Level (Elbow Extension): Level 2 (Red)            Pertinent Vitals/ Pain       Pain Assessment Pain Assessment: Faces Faces Pain Scale: Hurts even  more Pain Location: RLE Pain Descriptors / Indicators: Discomfort, Grimacing, Moaning Pain Intervention(s): Limited activity within patient's tolerance, Premedicated before session   Frequency  Min 2X/week        Progress Toward Goals  OT Goals(current goals can now be found in the care plan section)  Progress towards OT goals: Progressing toward goals  Acute Rehab OT Goals Patient Stated Goal: none stated OT Goal Formulation: With patient Time For Goal Achievement: 05/27/23 Potential to Achieve Goals: Good ADL Goals Pt Will Perform Lower Body Dressing: with mod assist;with adaptive equipment;sitting/lateral leans Pt Will Transfer to Toilet: stand pivot transfer;with transfer board;bedside commode;grab bars;with mod assist Pt Will Perform Toileting - Clothing Manipulation and hygiene: with mod assist;sitting/lateral leans Pt/caregiver will Perform Home Exercise Program: Increased strength;Both right and left upper extremity;With written HEP provided;With theraband  Plan         AM-PAC OT "6 Clicks" Daily Activity     Outcome Measure   Help from another person eating meals?: None Help from another person taking care of personal grooming?: A Little Help from another person toileting, which includes using toliet, bedpan, or urinal?: Total Help from another person bathing (including washing, rinsing, drying)?: A Lot Help from another person to put on and taking off regular upper body clothing?: A Little Help from another person to put on and taking off regular lower body clothing?: Total 6 Click Score: 14    End of Session Equipment Utilized During Treatment: Other (comment) (Red theraband)  OT Visit Diagnosis: Unsteadiness on feet (R26.81);Other abnormalities of gait and mobility (R26.89);Muscle weakness (generalized) (M62.81) Pain - Right/Left: Right Pain - part of body: Ankle and joints of foot   Activity Tolerance Patient limited by fatigue   Patient Left in  bed;with call bell/phone within reach   Nurse Communication Mobility status        Time: 4259-5638 OT Time Calculation (min): 20 min  Charges: OT General Charges $OT Visit: 1 Visit OT Treatments $Self Care/Home Management : 8-22 mins  Ivor Messier, OT  Acute Rehabilitation Services Office [phone] Secure chat preferred   Marilynne Drivers 05/20/2023, 12:32 PM

## 2023-05-20 NOTE — Discharge Instructions (Signed)
 Truitt Merle, MD Thyra Breed PA-C [facility] [address] [phone] (tel)   [phone] (fax)                                  POST-OPERATIVE INSTRUCTIONS     WEIGHT BEARING STATUS: Weightbearing for transfers only right lower extremity in splint  RANGE OF MOTION/ACTIVITY:  Ok for knee motion  WOUND CARE Please keep splint clean dry and intact until follow-up. If your splint gets wet for any reason please contact the office immediately.  Do not stick anything down your splint such as pencils, momey, hangers to try and scratch yourself.  If you feel itchy take Benadryl as prescribed on the bottle for itching You may shower on Post-Op Day #2.  You must keep splint dry during this process and may find that a plastic bag taped around the extremity or alternatively a towel based bath may be a better option.   If you get your splint wet or if it is damaged please contact our clinic.  EXERCISES Due to your splint being in place you will not be able to bear weight through your extremity.   DO NOT PUT ANY WEIGHT ON YOUR OPERATIVE LEG Please use crutches or a walker to avoid weight bearing.   DVT/PE prophylaxis:  Resume home dose Eliquis and Plavix  DIET: As you were eating previously.  Can use over the counter stool softeners and bowel preparations, such as Miralax, to help with bowel movements.  Narcotics can be constipating.  Be sure to drink plenty of fluids  REGIONAL ANESTHESIA (NERVE BLOCKS) The anesthesia team may have performed a nerve block for you if safe in the setting of your care.  This is a great tool used to minimize pain.  Typically the block may start wearing off overnight but the long acting medicine may last for 3-4 days.  The nerve block wearing off can be a challenging period but please utilize your as needed pain medications to try and manage this period.    POST-OP MEDICATIONS- Multimodal approach to pain control  In general your  pain will be controlled with a combination of substances.  Prescriptions unless otherwise discussed are electronically sent to your pharmacy.  This is a carefully made plan we use to minimize narcotic use.     - Acetaminophen - Non-narcotic pain medicine taken on a scheduled basis   - Oxycodone - This is a strong narcotic, to be used only on an "as needed" basis for pain.  FOLLOW-UP If you develop a Fever (>101.5), Redness or Drainage from the surgical incision site, please call our office to arrange for an evaluation. Please call the office to schedule a follow-up appointment for your incision check if you do not already have one, 7-10 days post-operatively.   VISIT OUR WEBSITE FOR ADDITIONAL INFORMATION: [email or web address]   HELPFUL INFORMATION  If you had a block, it will wear off between 8-24 hrs postop typically.  This is period when your pain may go from nearly zero to the pain you would have had postop without the block.  This is an abrupt transition but nothing dangerous is happening.  You may take an extra dose of narcotic when this happens.  You should wean off your narcotic medicines as soon as you are able.  Most patients will be off or using minimal narcotics before their first postop appointment.   We suggest  you use the pain medication the first night prior to going to bed, in order to ease any pain when the anesthesia wears off. You should avoid taking pain medications on an empty stomach as it will make you nauseous.  Do not drink alcoholic beverages or take illicit drugs when taking pain medications.  In most states it is against the law to drive while you are in a splint or sling.  And certainly against the law to drive while taking narcotics.  You may return to work/school in the next couple of days when you feel up to it.   Pain medication may make you constipated.  Below are a few solutions to try in this order: Decrease the amount of pain medication if you aren't  having pain. Drink lots of decaffeinated fluids. Drink prune juice and/or each dried prunes  If the first 3 don't work start with additional solutions Take Colace - an over-the-counter stool softener Take Senokot - an over-the-counter laxative Take Miralax - a stronger over-the-counter laxative

## 2023-05-20 NOTE — TOC Progression Note (Signed)
 Transition of Care [facility]) - Progression Note    Patient Details  Name: Harold [name] MRN: [id number] Date of Birth: 03-02-1955  Transition of Care [facility]) CM/SW Contact  Lorri Frederick, LCSW Phone Number: 05/20/2023, 11:25 AM  Clinical Narrative:   CSW informed by [facility] that [facility] has again been denied.    CSW spoke with pt in room, [name] on speakerphone.  Discussed LTAC denial, discussed SNF options.  They are unable to pay copays in advance as requested by [name].  They do not want to accept SNF offers at any of the other SNF facilities that have made bed offers.  They want to move forward with DC home with continued HH.  They had questions regarding midodrine, MD informed.      Expected Discharge Plan: Skilled Nursing Facility Barriers to Discharge: Continued Medical Work up  Expected Discharge Plan and Services In-house Referral: Clinical Social Work     Living arrangements for the past 2 months: Single Family Home                 DME Arranged: Other see comment (IV ABX therapy) DME Agency: Other - Comment ([facility]) Date DME Agency Contacted: 05/20/23 Time DME Agency Contacted: [phone] Representative spoke with at DME Agency: Pam HH Arranged: RN, PT, OT [facility] Agency: [facility] Date [facility] Agency Contacted: 05/20/23 Time HH Agency Contacted: [phone] Representative spoke with at [facility] Agency: Kandee Keen   Social Determinants of Health (SDOH) Interventions SDOH Screenings   Food Insecurity: No Food Insecurity (05/12/2023)  Housing: Low Risk  (05/12/2023)  Transportation Needs: No Transportation Needs (05/12/2023)  Utilities: Not At Risk (05/12/2023)  Social Connections: Moderately Integrated (05/12/2023)  Tobacco Use: Medium Risk (05/15/2023)    Readmission Risk Interventions     No data to display

## 2023-05-20 NOTE — Progress Notes (Signed)
   05/20/23 0013  BiPAP/CPAP/SIPAP  $ Non-Invasive Home Ventilator  Subsequent  BiPAP/CPAP/SIPAP Pt Type Adult  BiPAP/CPAP/SIPAP Resmed  Mask Type Nasal mask  Dentures removed? Not applicable  Respiratory Rate 16 breaths/min  EPAP 4 cmH2O  PEEP 4 cmH20  FiO2 (%) 21 %  Flow Rate 0 lpm  Patient Home Machine No  Patient Home Mask No  Patient Home Tubing No  Auto Titrate No  CPAP/SIPAP surface wiped down Yes  Device Plugged into RED Power Outlet Yes  BiPAP/CPAP /SiPAP Vitals  Pulse Rate 67  Resp 16  SpO2 97 %  Bilateral Breath Sounds Clear  MEWS Score/Color  MEWS Score 0  MEWS Score Color Chilton Si

## 2023-05-20 NOTE — Plan of Care (Signed)

## 2023-05-20 NOTE — TOC Progression Note (Addendum)
 Transition of Care [facility]) - Progression Note    Patient Details  Name: Harold [name] MRN: [id number] Date of Birth: [birth year]  Transition of [facility]) CM/SW Contact  Epifanio Lesches, RN Phone Number: 05/20/2023, 2:00 PM  Clinical Narrative:   MD states pt will be d/c ready on tomorrow.  NCM spoke with pt regarding d/c plan. Pt states he will d/c to home with the resumption of home health services with [facility].Adele Dan @ [facility] made aware.  Pt without DME needs. SGO to assist with care @ home.  TOC following and will assist with TOC needs...    Expected Discharge Plan: Skilled Nursing Facility Barriers to Discharge: Continued Medical Work up  Expected Discharge Plan and Services In-house Referral: Clinical Social Work Discharge Planning Services: CM Consult   Living arrangements for the past 2 months: Single Family Home                 DME Arranged: Other see comment         HH Arranged: RN, Disease Management, PT, OT, Nurse's Aide HH Agency: [facility] Date HH Agency Contacted: 05/20/23 Time HH Agency Contacted: [phone] Representative spoke with at [facility] Agency: Adele Dan   Social Determinants of Health (SDOH) Interventions SDOH Screenings   Food Insecurity: No Food Insecurity (05/12/2023)  Housing: Low Risk  (05/12/2023)  Transportation Needs: No Transportation Needs (05/12/2023)  Utilities: Not At Risk (05/12/2023)  Social Connections: Moderately Integrated (05/12/2023)  Tobacco Use: Medium Risk (05/15/2023)    Readmission Risk Interventions     No data to display

## 2023-05-21 DIAGNOSIS — S82891S Other fracture of right lower leg, sequela: Secondary | ICD-10-CM | POA: Diagnosis not present

## 2023-05-21 DIAGNOSIS — E43 Unspecified severe protein-calorie malnutrition: Secondary | ICD-10-CM | POA: Insufficient documentation

## 2023-05-21 LAB — GLUCOSE, CAPILLARY
Glucose-Capillary: 165 mg/dL — ABNORMAL HIGH (ref 70–99)
Glucose-Capillary: 160 mg/dL — ABNORMAL HIGH (ref 70–99)
Glucose-Capillary: 184 mg/dL — ABNORMAL HIGH (ref 70–99)

## 2023-05-21 LAB — BASIC METABOLIC PANEL WITH GFR
Anion gap: 11 (ref 5–15)
BUN: 26 mg/dL — ABNORMAL HIGH (ref 8–23)
CO2: 20 mmol/L — ABNORMAL LOW (ref 22–32)
Calcium: 8.7 mg/dL — ABNORMAL LOW (ref 8.9–10.3)
Chloride: 101 mmol/L (ref 98–111)
Creatinine, Ser: 2.38 mg/dL — ABNORMAL HIGH (ref 0.61–1.24)
GFR, Estimated: 29 mL/min — ABNORMAL LOW (ref >60)
Glucose, Bld: 181 mg/dL — ABNORMAL HIGH (ref 70–99)
Potassium: 3.7 mmol/L (ref 3.5–5.1)
Sodium: 132 mmol/L — ABNORMAL LOW (ref 135–145)

## 2023-05-21 LAB — CULTURE, BLOOD (ROUTINE X 2)
Culture: NO GROWTH
Special Requests: ADEQUATE

## 2023-05-21 MED ORDER — COLCHICINE 0.6 MG PO TABS: 0.6000 mg | ORAL_TABLET | ORAL | 0 refills | Status: DC

## 2023-05-21 MED ORDER — VITAMIN B-12 1000 MCG PO TABS: 1000.0000 ug | ORAL_TABLET | ORAL | Status: DC

## 2023-05-21 MED ORDER — FERROUS SULFATE 325 (65 FE) MG PO TABS: 325.0000 mg | ORAL_TABLET | ORAL | Status: DC

## 2023-05-21 MED ORDER — VITAMIN D (ERGOCALCIFEROL) 1.25 MG (50000 UNIT) PO CAPS: 50000.0000 [IU] | ORAL_CAPSULE | ORAL | 0 refills | Status: DC

## 2023-05-21 MED ORDER — TRESIBA FLEXTOUCH 200 UNIT/ML ~~LOC~~ SOPN: 10.0000 [IU] | PEN_INJECTOR | SUBCUTANEOUS | 0 refills | Status: DC

## 2023-05-21 MED ORDER — CIPROFLOXACIN HCL 500 MG PO TABS: 500.0000 mg | ORAL_TABLET | Freq: Two times a day (BID) | ORAL | 0 refills | Status: AC

## 2023-05-21 MED ORDER — B COMPLEX-C PO TABS
1.0000 | ORAL_TABLET | ORAL | Status: DC
  Filled 2023-05-21: qty 1

## 2023-05-21 NOTE — Progress Notes (Signed)
 Mobility Specialist Progress Note:   05/21/23 1052  Mobility  Activity Transferred from chair to bed  Level of Assistance +2 (takes two people)  Press photographer wheel walker  Distance Ambulated (ft) 4 ft  RUE Weight Bearing Per Provider Order TDWB  RLE Weight Bearing Per Provider Order WBAT (for transfers)  Activity Response Tolerated well  Mobility visit 1 Mobility  Mobility Specialist Start Time (ACUTE ONLY) 1040  Mobility Specialist Stop Time (ACUTE ONLY) 1049  Mobility Specialist Time Calculation (min) (ACUTE ONLY) 9 min   Pt received in chair, requesting to return to bed. Attempted to stand x2 but was unable to come upright. Able to stand/pivot on 3rd attempt w/ minA+2. Pt left in bed w/ call bell and all needs met. Bed alarm on.  D'Vante Earlene Plater Mobility Specialist Please contact via Special educational needs teacher or Rehab office at [phone]

## 2023-05-21 NOTE — Progress Notes (Signed)
 Physical Therapy Treatment Patient Details Name: Harold [name] MRN: [id number] DOB: 06-16-1955 Today's Date: 05/21/2023   History of Present Illness Pt admitted to [facility] 05/12/23 for R ankle pain. X-ray showed open ankle fx. Pt recently dc after 44 days in hospital. External fixation performed 3/24. S/p R ankle arthrodesis, right subtalar arthrodesis and removal of external fixation on 05/15/2023. Original R ankle fx in 11/24. PMH: HTN, HLD, CAD s/p PCI, COPD, DM2, CKD stage IIIb, PVD, gout, OSA, neuropathy, COPD, a-fib, CAD s/p percutaneous coronary angioplasty, angina, obesity    PT Comments  Pt admitted with above diagnosis. Pt continues to need mod assist to come to sitting. Pt min assist stand pivot transfer with RW to chair.  Pt states that his husband can assist him as needed at home and that they have equipment needed.  Recommend HHPT f/u as well.  Will need 24 hour care for safety.  Pt currently with functional limitations due to the deficits listed below (see PT Problem List). Pt will benefit from acute skilled PT to increase their independence and safety with mobility to allow discharge.       If plan is discharge home, recommend the following: Assistance with cooking/housework;Assist for transportation;Help with stairs or ramp for entrance;Two people to help with walking and/or transfers;Two people to help with bathing/dressing/bathroom   Can travel by private vehicle     Yes  Equipment Recommendations  None recommended by PT    Recommendations for Other Services       Precautions / Restrictions Precautions Precautions: Fall Recall of Precautions/Restrictions: Impaired Required Braces or Orthoses: Splint/Cast Splint/Cast: RLE splint Other Brace: abdominal binder (Prn) Restrictions Weight Bearing Restrictions Per Provider Order: Yes RLE Weight Bearing Per Provider Order: Touchdown weight bearing (for transfers only) Other Position/Activity Restrictions: pt allowed to Wb  through toes to transfer only (orders states WB RLE transfers only, however PA verbally stated to pt he can only put weight through his toes)     Mobility  Bed Mobility Overal bed mobility: Needs Assistance Bed Mobility: Rolling, Sidelying to Sit Rolling: Mod assist Sidelying to sit: Mod assist, HOB elevated Supine to sit: Mod assist, Used rails, +2 for physical assistance     General bed mobility comments: Pt states that his husband lifts the top of bed up prior to getting up therefore raised the [facility] all the way up. Pt needed mod assist to come to sitting with incr time.    Transfers Overall transfer level: Needs assistance Equipment used: Rolling walker (2 wheels) Transfers: Sit to/from Stand, Bed to chair/wheelchair/BSC Sit to Stand: From elevated surface, Contact guard assist Stand pivot transfers: From elevated surface, Contact guard assist         General transfer comment: Pt able to stand to RW with CGA and cues for hand placement.  Pt took pivotal steps to chair with RW with CGA and min cues for safety. Needed cues to reach back for safety.    Ambulation/Gait               General Gait Details: N/A due to pt transfers only   Stairs             Wheelchair Mobility     Tilt Bed    Modified Rankin (Stroke Patients Only)       Balance Overall balance assessment: Needs assistance Sitting-balance support: Bilateral upper extremity supported, Feet supported, No upper extremity supported Sitting balance-Leahy Scale: Fair     Standing balance support: Bilateral upper  extremity supported, During functional activity, Reliant on assistive device for balance Standing balance-Leahy Scale: Poor Standing balance comment: reliant on UE support and guard assist for static stance and pivot transfers                            Communication Communication Communication: No apparent difficulties Factors Affecting Communication: Reduced clarity of  speech (slurred speech at end of session)  Cognition Arousal: Alert Behavior During Therapy: Flat affect   PT - Cognitive impairments: Safety/Judgement, Problem solving                       PT - Cognition Comments: slower processing today, requires step-by-step cuing for mobility. Following commands: Intact Following commands impaired: Follows one step commands with increased time    Cueing Cueing Techniques: Verbal cues, Tactile cues  Exercises General Exercises - Lower Extremity Ankle Circles/Pumps: AROM, Both, 10 reps, Seated Quad Sets: AROM, Both, 5 reps, Seated Gluteal Sets: Supine, Both, 10 reps, Strengthening    General Comments        Pertinent Vitals/Pain Pain Assessment Pain Assessment: Faces Faces Pain Scale: Hurts little more Breathing: normal Negative Vocalization: none Facial Expression: smiling or inexpressive Body Language: relaxed Consolability: no need to console PAINAD Score: 0 Pain Location: RLE Pain Descriptors / Indicators: Discomfort, Grimacing, Moaning Pain Intervention(s): Limited activity within patient's tolerance, Monitored during session, Repositioned    Home Living         Home Access: Ramped entrance         Home Equipment: Shower Counsellor (2 wheels);Transport chair;Grab bars - toilet;Grab bars - tub/shower;BSC/3in1      Prior Function            PT Goals (current goals can now be found in the care plan section) Progress towards PT goals: Progressing toward goals    Frequency    Min 3X/week      PT Plan      Co-evaluation   Reason for Co-Treatment: For patient/therapist safety;To address functional/ADL transfers          AM-PAC PT "6 Clicks" Mobility   Outcome Measure  Help needed turning from your back to your side while in a flat bed without using bedrails?: A Lot Help needed moving from lying on your back to sitting on the side of a flat bed without using bedrails?: A Lot Help needed  moving to and from a bed to a chair (including a wheelchair)?: A Little Help needed standing up from a chair using your arms (e.g., wheelchair or bedside chair)?: A Little Help needed to walk in hospital room?: Total Help needed climbing 3-5 steps with a railing? : Total 6 Click Score: 12    End of Session Equipment Utilized During Treatment: Gait belt Activity Tolerance: Patient limited by fatigue;Patient limited by pain Patient left: with call bell/phone within reach;in chair;with chair alarm set Nurse Communication: Mobility status PT Visit Diagnosis: Other abnormalities of gait and mobility (R26.89);Muscle weakness (generalized) (M62.81);Repeated falls (R29.6)     Time: 9147-8295 PT Time Calculation (min) (ACUTE ONLY): 42 min  Charges:    $Therapeutic Exercise: 8-22 mins $Therapeutic Activity: 23-37 mins PT General Charges $$ ACUTE PT VISIT: 1 Visit                     Raney Koeppen M,PT Acute Rehab Services [phone]    Bevelyn Buckles 05/21/2023, 1:13 PM

## 2023-05-21 NOTE — Progress Notes (Addendum)
 DISCHARGE NOTE HOME Harold [name] to be discharged Home per MD order. Discussed prescriptions and follow up appointments with the patient. Prescriptions given to patient; medication list explained in detail. Patient verbalized understanding.  Skin clean, dry and intact without evidence of skin break down, no evidence of skin tears noted. IV catheter discontinued intact. Site without signs and symptoms of complications. Dressing and pressure applied. Pt denies pain at the site currently. No complaints noted.  See LDA for incisions and wounds at discharge  An After Visit Summary (AVS) was printed and given to the patient. Patient to remain on floor intil ride arrives  Velia Meyer, [employer]

## 2023-05-21 NOTE — Progress Notes (Signed)
 DISCHARGE NOTE HOME Karion Cudd Benton-Elliot to be discharged Home per MD order. Discussed prescriptions and follow up appointments with the patient. Prescriptions given to patient; medication list explained in detail. Patient verbalized understanding.  Skin clean, dry and intact without evidence of skin break down, no evidence of skin tears noted. IV catheter discontinued intact. Site without signs and symptoms of complications. Dressing and pressure applied. Pt denies pain at the site currently. No complaints noted.  Patient free of lines, drains, and wounds.   An After Visit Summary (AVS) was printed and given to the patient. Patient escorted via wheelchair, and discharged home via private auto.  Velia Meyer, RN

## 2023-05-21 NOTE — Discharge Summary (Signed)
 Physician Discharge Summary  Harold [name] RUE:[id number] DOB: 1955-06-20 DOA: 05/12/2023  PCP: [name], MD  Admit date: 05/12/2023 Discharge date: 05/21/2023  Admitted From: Home Disposition: Home  Recommendations for Outpatient Follow-up:  Follow up with PCP in 1-2 weeks Follow-up with orthopedic surgery as scheduled  Home Health: Resume home health with [facility] Equipment/Devices: No new equipment  Discharge Condition: Stable CODE STATUS: Full Diet recommendation: Low-salt low-fat low-carb diet  Brief/Interim Summary: Harold [name] is an 68 y.o. male past medical history of morbid obesity, obstructive sleep apnea diabetes mellitus type 2 on A1c, CAD with a history of stent, atrial fibrillation on Eliquis, carotid stenosis, chronic kidney disease stage IIIb, diabetic peripheral neuropathy, hypothyroidism recently discharged on 05/10/2018 for comes in for right ankle pain BTK surgery was consulted underwent external fixation of the right ankle with closed reduction.  Status post removal of external fixation per orthopedic surgery, tolerating well.  During hospitalization patient was found to have positive blood cultures for Enterobacter species, will continue Cipro through 06/12/2023 per ID, HD catheter also discontinued, renal improved. Discussed discharge to LTAC versus SNF but patient was found to be not appropriate by his insurance.  As such patient will discharge home to resume home health physical therapy and care under his prior home health company.   Discharge Diagnoses:  Principal Problem:   Open right ankle fracture, sequela Active Problems:   Chronic renal disease, stage IV (HCC)   Essential hypertension   OSA on CPAP   Paroxysmal atrial fibrillation (HCC)   Acquired hypothyroidism   Uncontrolled type 2 diabetes mellitus with hyperglycemia, with long-term current use of insulin (HCC)   CAD, LAD/CFX DES 04/28/12- staged RCA DES 04/29/12 after pt  declined CABG, cath 05/14/13 stable CAD medical therapy   Anxiety and depression   Gastroesophageal reflux disease without esophagitis   Protein-calorie malnutrition, severe   Open right ankle fracture, sequela: On November 2024 underwent ORIF for ankle fracture, with revision in December 2024 for broken hardware.  04/02/2023 underwent I&D by Dr. Lajoyce Corners. 04/28/2023 underwent hardware removal due to infection. Now readmitted on 05/12/2023 for external fixation of the right ankle on the same day. Status post right ankle arthrodesis, right subtalar arthrodesis and removal of external fixation on 05/15/2023. Continue narcotics, anticoagulation and weightbearing per Ortho. PT evaluated the patient, TOC helping with disposition. They relate LTAC/[facility] is probably an option.   Bacteremia Enterobacter species: Tmax 98.1 no leukocytosis. Blood cultures growing Enterobacter. Continue oral antibiotics through 06/12/2023. HD catheter discontinued, it was discussed with renal.   Acute postoperative blood loss anemia/chronic kidney disease IV: Status post blood transfusion with appropriate response.   Chronic renal disease, stage IV (HCC) Renal function is stable, his baseline is around 3.2. Current creatinine is improved from prior baseline currently 2.4   Elevated blood pressure: Midodrine has been discontinued and his blood pressure has been elevated.  Recommend close follow-up outpatient with PCP in regards to prior hypotension as this may be improving with renal function   Hypomagnesemia: Repeat labs with PCP in the next few weeks   Paroxysmal atrial fibrillation: Continue amiodarone, Eliquis   CAD, LAD/CFX DES 04/28/12- staged RCA DES 04/29/12 after pt declined CABG, cath 05/14/13  stable CAD medical therapy: Continue Plavix, Eliquis   Struct of sleep apnea: Continue CPAP Anxiety and depression:  continue Cymbalta. Chronic hypotension: Midodrine discontinued as blood pressure is around  160s. OSA on CPAP Continue CPAP. Acquired hypothyroidism Continue Synthroid. Uncontrolled type 2 diabetes mellitus with  hyperglycemia, with long-term current use of insulin: Chronic gout: Continue colchicine. Morbid obesity: Body mass index is 36.21 kg/m.  Pressure injury, POA Pressure Injury 04/17/23 Ankle Right;Anterior Deep Tissue Pressure Injury - Purple or maroon localized area of discolored intact skin or blood-filled blister due to damage of underlying soft tissue from pressure and/or shear. (Active)  04/17/23 2040  Location: Ankle  Location Orientation: Right;Anterior  Staging: Deep Tissue Pressure Injury - Purple or maroon localized area of discolored intact skin or blood-filled blister due to damage of underlying soft tissue from pressure and/or shear.  Wound Description (Comments):   Present on Admission: Yes     Pressure Injury 05/12/23 Buttocks Left Stage 2 -  Partial thickness loss of dermis presenting as a shallow open injury with a red, pink wound bed without slough. st 2 pressure ulcer, (Active)  05/12/23 2030  Location: Buttocks  Location Orientation: Left  Staging: Stage 2 -  Partial thickness loss of dermis presenting as a shallow open injury with a red, pink wound bed without slough.  Wound Description (Comments): st 2 pressure ulcer,  Present on Admission: No     Pressure Injury 05/17/23 Right Stage 2 -  Partial thickness loss of dermis presenting as a shallow open injury with a red, pink wound bed without slough. st 2 inner buttocks (Active)  05/17/23 1945  Location:   Location Orientation: Right  Staging: Stage 2 -  Partial thickness loss of dermis presenting as a shallow open injury with a red, pink wound bed without slough.  Wound Description (Comments): st 2 inner buttocks  Present on Admission: No     Discharge Instructions  Discharge Instructions     Call MD for:  difficulty breathing, headache or visual disturbances   Complete by: As directed     Call MD for:  extreme fatigue   Complete by: As directed    Call MD for:  hives   Complete by: As directed    Call MD for:  persistant dizziness or light-headedness   Complete by: As directed    Call MD for:  persistant nausea and vomiting   Complete by: As directed    Call MD for:  redness, tenderness, or signs of infection (pain, swelling, redness, odor or green/yellow discharge around incision site)   Complete by: As directed    Call MD for:  severe uncontrolled pain   Complete by: As directed    Call MD for:  temperature >100.4   Complete by: As directed    Diet - low sodium heart healthy   Complete by: As directed    Increase activity slowly   Complete by: As directed    No dressing needed   Complete by: As directed       Allergies as of 05/21/2023       Reactions   Doxycycline Hives, Rash   Septra [bactrim] Itching   Penicillins Rash   Allergy to All cillin drugs  Ancef given 9/24 with no obvious reaction   Metformin And Related Diarrhea, Nausea Only   Other Nausea And Vomiting   General Anesthesia - sometimes causes nausea and vomiting * OK to use scopolamine patch     Sulfamethoxazole-trimethoprim Other (See Comments)   Wellbutrin [bupropion]    Mood Changes        Medication List     STOP taking these medications    benzonatate 200 MG capsule Commonly known as: TESSALON   HYDROcodone bit-homatropine 5-1.5 MG/5ML syrup Commonly known as: HYCODAN  losartan 25 MG tablet Commonly known as: COZAAR   midodrine 5 MG tablet Commonly known as: PROAMATINE   PRESCRIPTION MEDICATION   traZODone 100 MG tablet Commonly known as: DESYREL       TAKE these medications    acetaminophen 500 MG tablet Commonly known as: TYLENOL Take 1 tablet (500 mg total) by mouth every 6 (six) hours as needed for mild pain (pain score 1-3).   allopurinol 100 MG tablet Commonly known as: ZYLOPRIM Take 0.5 tablets (50 mg total) by mouth daily.   amiodarone 200 MG  tablet Commonly known as: PACERONE Take 1 tablet (200 mg total) by mouth 2 (two) times daily.   atorvastatin 40 MG tablet Commonly known as: LIPITOR Take 1 tablet (40 mg total) by mouth daily.   ciprofloxacin 500 MG tablet Commonly known as: CIPRO Take 1 tablet (500 mg total) by mouth 2 (two) times daily for 22 days.   clopidogrel 75 MG tablet Commonly known as: PLAVIX Take 75 mg by mouth every morning.   colchicine 0.6 MG tablet Take 1 tablet (0.6 mg total) by mouth every other day. Start taking on: May 23, 2023 What changed: when to take this   DULoxetine 60 MG capsule Commonly known as: CYMBALTA Take 60 mg by mouth daily.   Eliquis 5 MG Tabs tablet Generic drug: apixaban TAKE 1 TABLET BY MOUTH TWICE A DAY   ezetimibe 10 MG tablet Commonly known as: ZETIA Take 10 mg by mouth daily.   furosemide 80 MG tablet Commonly known as: LASIX Take 0.5 tablets (40 mg total) by mouth daily. May take additional tablet for weight gain of 3 pounds in one day or five pounds in one week What changed:  how much to take when to take this   gabapentin 300 MG capsule Commonly known as: NEURONTIN Take 1 capsule (300 mg total) by mouth at bedtime.   levothyroxine 88 MCG tablet Commonly known as: SYNTHROID Take 88 mcg by mouth daily before breakfast.   loratadine 10 MG tablet Commonly known as: CLARITIN Take 1 tablet (10 mg total) by mouth daily.   methocarbamol 500 MG tablet Commonly known as: ROBAXIN Take 1 tablet (500 mg total) by mouth every 6 (six) hours as needed for muscle spasms.   Mounjaro 15 MG/0.5ML Pen Generic drug: tirzepatide Inject 15 mg into the skin once a week. What changed: additional instructions   multivitamin with minerals Tabs tablet Take 1 tablet by mouth daily.   nitroGLYCERIN 0.4 MG SL tablet Commonly known as: NITROSTAT PLACE 1 TAB UNDER THE TONGUE EVERY 5 MINUTES AS NEEDED FOR CHEST PAIN (SEVERE PRESSURE OR TIGHTNESS)   oxyCODONE 5 MG  immediate release tablet Commonly known as: Oxy IR/ROXICODONE Take 1 tablet (5 mg total) by mouth every 4 (four) hours as needed for severe pain (pain score 7-10).   pantoprazole 40 MG tablet Commonly known as: PROTONIX TAKE 1 TABLET BY MOUTH EVERY DAY   Sodium Fluoride 5000 PPM 1.1 % Pste Generic drug: Sodium Fluoride Take 1 Application by mouth in the morning and at bedtime.   tamsulosin 0.4 MG Caps capsule Commonly known as: FLOMAX Take 1 capsule (0.4 mg total) by mouth daily.   Evaristo Bury FlexTouch 200 UNIT/ML FlexTouch Pen Generic drug: insulin degludec Inject 10 Units into the skin daily. What changed:  how much to take when to take this   Vitamin D (Ergocalciferol) 1.25 MG (50000 UNIT) Caps capsule Commonly known as: DRISDOL Take 1 capsule (50,000 Units total) by mouth every  7 (seven) days. Start taking on: May 27, 2023               Discharge Care Instructions  (From admission, onward)           Start     Ordered   05/21/23 0000  No dressing needed        05/21/23 1102            Follow-up Information     [name], MD Follow up.   Specialty: Internal Medicine Contact information: [address] [place] [address] [phone]         [facility] Follow up.   Specialty: Home Health Services Why: home health services will be provided by [facility], start og care 05/21/2023 Contact information: [address] [place] [address] [phone]         [facility] Follow up.   Why: [facility] will provide home antibiotic trherapy.        Haddix, Gillie Manners, MD. Schedule an appointment as soon as possible for a visit in 2 week(s).   Specialty: Orthopedic Surgery Why: for splint/suture removal, repeat x-rays Contact information: [address] [place] [address] [phone]                Allergies  Allergen Reactions   Doxycycline Hives and Rash   Septra [Bactrim]  Itching   Penicillins Rash    Allergy to All cillin drugs  Ancef given 9/24 with no obvious reaction   Metformin And Related Diarrhea and Nausea Only   Other Nausea And Vomiting    General Anesthesia - sometimes causes nausea and vomiting * OK to use scopolamine patch     Sulfamethoxazole-Trimethoprim Other (See Comments)   Wellbutrin [Bupropion]     Mood Changes    Consultations: Orthopedic surgery  Procedures/Studies: DG CHEST PORT 1 VIEW Result Date: 05/16/2023 CLINICAL DATA:  Fever.  History of open ankle fracture. EXAM: PORTABLE CHEST 1 VIEW COMPARISON:  Radiographs 05/13/2023 and 04/17/2023. FINDINGS: 0806 hours. Left IJ hemodialysis catheter is unchanged at the level of the superior cavoatrial junction. The lungs are clear. There is no pleural effusion or pneumothorax. Telemetry leads overlie the chest. The bones appear unchanged. IMPRESSION: No evidence of acute cardiopulmonary process. Stable left IJ hemodialysis catheter. Electronically Signed   By: Carey Bullocks M.D.   On: 05/16/2023 12:02   DG Ankle Right Port Result Date: 05/15/2023 CLINICAL DATA:  Postop. EXAM: PORTABLE RIGHT ANKLE - 2 VIEW COMPARISON:  CT 05/13/2023 FINDINGS: Fusion of the distal tibia, talus and calcaneus with nail and screw fixation. Ghost tracks from prior hardware. External fixator has been removed. Unchanged alignment of medial malleolar fracture. Overlying splint material limits osseous and soft tissue fine detail. IMPRESSION: Fusion of the distal tibia, talus and calcaneus with nail and screw fixation. Electronically Signed   By: Narda Rutherford M.D.   On: 05/15/2023 13:17   DG Ankle Complete Right Result Date: 05/15/2023 CLINICAL DATA:  Ankle surgery.  Ankle arthrodesis. EXAM: RIGHT ANKLE - COMPLETE 3+ VIEW COMPARISON:  CT 05/13/2023 FINDINGS: Nine fluoroscopic spot views of the right ankle submitted from the operating room. The external fixator has been removed. Calcaneal, talar, tibial effusion with  nail and locking screws. Fluoroscopy time 88.7 seconds. Dose 1.3 mGy IMPRESSION: Intraoperative fluoroscopy during ankle arthrodesis. Electronically Signed   By: Narda Rutherford M.D.   On: 05/15/2023 11:16   DG C-Arm 1-60 Min-No Report  Result Date: 05/15/2023 Fluoroscopy was utilized by the requesting physician.  No radiographic interpretation.   DG Ribs Unilateral W/Chest Right Result Date: 05/13/2023 CLINICAL DATA:  Rib pain EXAM: RIGHT RIBS AND CHEST - 3+ VIEW COMPARISON:  Chest x-ray 04/17/2023 FINDINGS: No fracture or other bone lesions are seen involving the ribs. There is no evidence of pneumothorax or pleural effusion. Both lungs are clear. The heart is enlarged. Left-sided central venous catheter tip projects over the distal SVC. IMPRESSION: 1. No evidence of rib fracture. 2. Cardiomegaly. Electronically Signed   By: Darliss Cheney M.D.   On: 05/13/2023 15:56   CT ANKLE RIGHT WO CONTRAST Result Date: 05/13/2023 CLINICAL DATA:  One day postop following irrigation and debridement with external fixation of right trimalleolar ankle fracture EXAM: CT OF THE RIGHT ANKLE WITHOUT CONTRAST TECHNIQUE: Multidetector CT imaging of the right ankle was performed according to the standard protocol. Multiplanar CT image reconstructions were also generated. RADIATION DOSE REDUCTION: This exam was performed according to the departmental dose-optimization program which includes automated exposure control, adjustment of the mA and/or kV according to patient size and/or use of iterative reconstruction technique. COMPARISON:  X-ray 05/12/2023, CT 03/30/2023 FINDINGS: Bones/Joint/Cartilage Status post external fixation of right ankle fracture with hardware spanning the calcaneal body and distal tibial diaphysis. Mildly displaced fracture of the distal fibular diaphysis with mild displacement. There is bony callus formation along the fracture site. Prior hardware removal of the distal fibula. Mildly distracted fracture  through the base of the medial malleolus. Posterior malleolar fracture is posteriorly displaced by 7 mm. Evidence of prior hardware removal in the distal tibia as well as removal of prior subtalar hardware. Progressive tibiotalar joint space loss. Tibiotalar joint effusion with multiple foci of intra-articular air, likely related to recent surgery. Bones are demineralized, likely secondary to disuse. Alignment of the hindfoot and midfoot is maintained. Ligaments Suboptimally assessed by CT. Muscles and Tendons Tibialis posterior tendon is ill-defined. Generalized muscle atrophy. Soft tissues Diffuse soft tissue swelling about the ankle. There is air in the soft tissues at the medial aspect of the ankle, presumably related to prior surgery. Superimposed infection not excluded. IMPRESSION: 1. Status post external fixation of trimalleolar right ankle fracture. 2. Progressive tibiotalar joint space loss. 3. Diffuse soft tissue swelling about the ankle. There is air in the soft tissues at the medial aspect of the ankle, presumably related to prior surgery. Superimposed infection not excluded. 4. Tibialis posterior tendon is ill-defined. Electronically Signed   By: Duanne Guess D.O.   On: 05/13/2023 12:51   DG Ankle Right Port Result Date: 05/12/2023 CLINICAL DATA:  Status post external fixator placement EXAM: PORTABLE RIGHT ANKLE - 2 VIEW COMPARISON:  Intraoperative films from earlier in the same day. FINDINGS: External fixator is noted in place. Fibular and distal tibial fractures are seen. No new focal abnormality is noted. IMPRESSION: Stable external fixator. Electronically Signed   By: Alcide Clever M.D.   On: 05/12/2023 22:12   DG Tibia/Fibula Right Result Date: 05/12/2023 CLINICAL DATA:  External fixator placement EXAM: RIGHT TIBIA AND FIBULA - 2 VIEW COMPARISON:  Films from earlier in the same day. FLUOROSCOPY TIME:  Radiation Exposure Index (as provided by the fluoroscopic device): 0.57 mGy If the device  does not provide the exposure index: Fluoroscopy Time:  14 seconds Number of Acquired Images:  3 FINDINGS: Initial images demonstrate the prior fibular fracture and removed surgical hardware. Medial malleolar fragments are again noted similar to that seen on the prior exam.  External fixator is then placed. Posterior malleolar fracture is noted as well. IMPRESSION: Placement of external fixator. Electronically Signed   By: Alcide Clever M.D.   On: 05/12/2023 22:12   DG C-Arm 1-60 Min-No Report Result Date: 05/12/2023 Fluoroscopy was utilized by the requesting physician.  No radiographic interpretation.   DG Ankle Complete Right Result Date: 05/12/2023 CLINICAL DATA:  sp fall, open fracture; sp fall with open fracture. EXAM: RIGHT ANKLE - COMPLETE 3+ VIEW; RIGHT TIBIA AND FIBULA - 2 VIEW COMPARISON:  None Available. FINDINGS: There is comminuted and displaced fracture of the lower diaphysis of the fibula. There are ghost tracks in the fibula from previously removed hardware. There is faint ossific density surrounding the fracture site which may represent underlying heterotopic ossification. There are also age indeterminate fractures of the medial malleolus and posterior malleolus. No other acute fracture or dislocation. No aggressive osseous lesion. Ankle mortise appears intact. Mild-to-moderate diffuse soft tissue swelling overlying the medial malleolus. Vascular stent noted along the lower third femur. No radiopaque foreign bodies. IMPRESSION: *Comminuted and displaced fracture of the lower diaphysis of the fibula. *Age indeterminate fractures of the medial and posterior malleoli. Electronically Signed   By: Jules Schick M.D.   On: 05/12/2023 12:01   DG Tibia/Fibula Right Result Date: 05/12/2023 CLINICAL DATA:  sp fall, open fracture; sp fall with open fracture. EXAM: RIGHT ANKLE - COMPLETE 3+ VIEW; RIGHT TIBIA AND FIBULA - 2 VIEW COMPARISON:  None Available. FINDINGS: There is comminuted and displaced  fracture of the lower diaphysis of the fibula. There are ghost tracks in the fibula from previously removed hardware. There is faint ossific density surrounding the fracture site which may represent underlying heterotopic ossification. There are also age indeterminate fractures of the medial malleolus and posterior malleolus. No other acute fracture or dislocation. No aggressive osseous lesion. Ankle mortise appears intact. Mild-to-moderate diffuse soft tissue swelling overlying the medial malleolus. Vascular stent noted along the lower third femur. No radiopaque foreign bodies. IMPRESSION: *Comminuted and displaced fracture of the lower diaphysis of the fibula. *Age indeterminate fractures of the medial and posterior malleoli. Electronically Signed   By: Jules Schick M.D.   On: 05/12/2023 12:01   IR Removal Tun Cv Cath W/O FL Result Date: 05/05/2023 INDICATION: 68 year old male with recent osteomyelitis s/p IV antibiotic therapy via right tunneled central venous catheter placed 04/07/23. Catheter no longer needed. EXAM: REMOVAL TUNNELED CENTRAL VENOUS CATHETER MEDICATIONS: None ANESTHESIA/SEDATION: None FLUOROSCOPY: None COMPLICATIONS: None immediate. PROCEDURE: Informed written consent was obtained from the patient after a thorough discussion of the procedural risks, benefits and alternatives. All questions were addressed. Maximal Sterile Barrier Technique was utilized including caps, mask, sterile gowns, sterile gloves, sterile drape, hand hygiene and skin antiseptic. A timeout was performed prior to the initiation of the procedure. The patient's right chest and catheter was prepped and draped in a normal sterile fashion. Heparin was removed from both ports of catheter. 1% lidocaine was used for local anesthesia. Using manual traction the cuff of the catheter was exposed and the catheter was removed in it's entirety. Pressure was held till hemostasis was obtained. A sterile dressing was applied. The patient  tolerated the procedure well with no immediate complications. IMPRESSION: Successful catheter removal as described above. Performed by: Loyce Dys PA-C Electronically Signed   By: Corlis Leak M.D.   On: 05/05/2023 16:05   CT HEAD WO CONTRAST ( ) Result Date: 04/29/2023 CLINICAL DATA:  68 year old male with vertigo. EXAM: CT HEAD WITHOUT  CONTRAST TECHNIQUE: Contiguous axial images were obtained from the base of the skull through the vertex without intravenous contrast. RADIATION DOSE REDUCTION: This exam was performed according to the departmental dose-optimization program which includes automated exposure control, adjustment of the mA and/or kV according to patient size and/or use of iterative reconstruction technique. COMPARISON:  Brain MRI and head CT 04/19/2023 FINDINGS: Brain: No midline shift, ventriculomegaly, mass effect, evidence of mass lesion, intracranial hemorrhage or evidence of cortically based acute infarction. Gray-white differentiation stable and within normal limits for age. Vascular: No suspicious intracranial vascular hyperdensity. Extensive calcified atherosclerosis at the skull base. Skull: No acute osseous abnormality identified. Partially visible advanced upper cervical spine degeneration. Sinuses/Orbits: Visualized paranasal sinuses and mastoids are stable and well aerated. Tympanic cavities appear clear. Other: Stable orbit and scalp soft tissues. Some calcified scalp vessel atherosclerosis. IMPRESSION: 1. Stable and negative for age noncontrast CT appearance of the Brain. 2. Advanced calcified atherosclerosis. Electronically Signed   By: Odessa Fleming M.D.   On: 04/29/2023 15:41   IR Fluoro Guide CV Line Left Result Date: 04/22/2023 INDICATION: 6 year old with acute kidney injury. Patient needs a dialysis catheter. EXAM: FLUOROSCOPIC AND ULTRASOUND GUIDED PLACEMENT OF A TUNNELED DIALYSIS CATHETER Physician: Rachelle Hora. Lowella Dandy, MD MEDICATIONS: Vancomycin 1 g; The antibiotic was administered  within an appropriate time interval prior to skin puncture. ANESTHESIA/SEDATION: Moderate (conscious) sedation was employed during this procedure. A total of Versed 1.0mg  and fentanyl 50 mcg was administered intravenously at the order of the provider performing the procedure. Total intra-service moderate sedation time: 20 minutes. Patient's level of consciousness and vital signs were monitored continuously by radiology nurse throughout the procedure under the supervision of the provider performing the procedure. FLUOROSCOPY TIME:  Radiation Exposure Index (as provided by the fluoroscopic device): 8 mGy Kerma COMPLICATIONS: None immediate. PROCEDURE: Informed consent was obtained for placement of a tunneled dialysis catheter. The patient was placed supine on the interventional table. Ultrasound confirmed a patent left internal jugular vein. Ultrasound image obtained for documentation. The left neck and chest was prepped and draped in a sterile fashion. Maximal barrier sterile technique was utilized including caps, mask, sterile gowns, sterile gloves, sterile drape, hand hygiene and skin antiseptic. The left neck was anesthetized with 1% lidocaine. A small incision was made with #11 blade scalpel. A 21 gauge needle directed into the left internal jugular vein with ultrasound guidance. A micropuncture dilator set was placed. A 28 cm tip to cuff Palindrome catheter was selected. The skin below the left clavicle was anesthetized and a small incision was made with an #11 blade scalpel. A subcutaneous tunnel was formed to the vein dermatotomy site. The catheter was brought through the tunnel. The vein dermatotomy site was dilated to accommodate a peel-away sheath over a wire. The catheter was placed through the peel-away sheath and directed into the central venous structures. The tip of the catheter was placed at superior cavoatrial junction with fluoroscopy. Fluoroscopic images were obtained for documentation. Both lumens  were found to aspirate and flush well. The proper amount of heparin was flushed in both lumens. The vein dermatotomy site was closed using a single layer of absorbable suture and Dermabond. The catheter was secured to the skin using Prolene suture. IMPRESSION: Successful placement of a left jugular tunneled dialysis catheter using ultrasound and fluoroscopic guidance. Electronically Signed   By: Richarda Overlie M.D.   On: 04/22/2023 15:05   IR US Guide Vasc Access Left Result Date: 04/22/2023 INDICATION: 40 year old with acute kidney injury.  Patient needs a dialysis catheter. EXAM: FLUOROSCOPIC AND ULTRASOUND GUIDED PLACEMENT OF A TUNNELED DIALYSIS CATHETER Physician: Rachelle Hora. Lowella Dandy, MD MEDICATIONS: Vancomycin 1 g; The antibiotic was administered within an appropriate time interval prior to skin puncture. ANESTHESIA/SEDATION: Moderate (conscious) sedation was employed during this procedure. A total of Versed 1.0mg  and fentanyl 50 mcg was administered intravenously at the order of the provider performing the procedure. Total intra-service moderate sedation time: 20 minutes. Patient's level of consciousness and vital signs were monitored continuously by radiology nurse throughout the procedure under the supervision of the provider performing the procedure. FLUOROSCOPY TIME:  Radiation Exposure Index (as provided by the fluoroscopic device): 8 mGy Kerma COMPLICATIONS: None immediate. PROCEDURE: Informed consent was obtained for placement of a tunneled dialysis catheter. The patient was placed supine on the interventional table. Ultrasound confirmed a patent left internal jugular vein. Ultrasound image obtained for documentation. The left neck and chest was prepped and draped in a sterile fashion. Maximal barrier sterile technique was utilized including caps, mask, sterile gowns, sterile gloves, sterile drape, hand hygiene and skin antiseptic. The left neck was anesthetized with 1% lidocaine. A small incision was made with  #11 blade scalpel. A 21 gauge needle directed into the left internal jugular vein with ultrasound guidance. A micropuncture dilator set was placed. A 28 cm tip to cuff Palindrome catheter was selected. The skin below the left clavicle was anesthetized and a small incision was made with an #11 blade scalpel. A subcutaneous tunnel was formed to the vein dermatotomy site. The catheter was brought through the tunnel. The vein dermatotomy site was dilated to accommodate a peel-away sheath over a wire. The catheter was placed through the peel-away sheath and directed into the central venous structures. The tip of the catheter was placed at superior cavoatrial junction with fluoroscopy. Fluoroscopic images were obtained for documentation. Both lumens were found to aspirate and flush well. The proper amount of heparin was flushed in both lumens. The vein dermatotomy site was closed using a single layer of absorbable suture and Dermabond. The catheter was secured to the skin using Prolene suture. IMPRESSION: Successful placement of a left jugular tunneled dialysis catheter using ultrasound and fluoroscopic guidance. Electronically Signed   By: Richarda Overlie M.D.   On: 04/22/2023 15:05     Subjective: No acute issues or events overnight   Discharge Exam: Vitals:   05/21/23 0523 05/21/23 0900  BP: (!) 176/74 (!) 185/70  Pulse: 64 63  Resp: 16   Temp: (!) 97.5 F (36.4 C) 99 F (37.2 C)  SpO2: 100% 99%   Vitals:   05/20/23 1927 05/21/23 0500 05/21/23 0523 05/21/23 0900  BP: (!) 167/64  (!) 176/74 (!) 185/70  Pulse: 62  64 63  Resp: 16  16   Temp: 97.7 F (36.5 C)  (!) 97.5 F (36.4 C) 99 F (37.2 C)  TempSrc:    Oral  SpO2: 100%  100% 99%  Weight:  124.5 kg    Height:        General: Pt is alert, awake, not in acute distress Cardiovascular: RRR, S1/S2 +, no rubs, no gallops Respiratory: CTA bilaterally, no wheezing, no rhonchi Abdominal: Soft, NT, ND, bowel sounds + Extremities: no edema, no  cyanosis    The results of significant diagnostics from this hospitalization (including imaging, microbiology, ancillary and laboratory) are listed below for reference.     Microbiology: Recent Results (from the past 240 hours)  Surgical pcr screen     Status: None  Collection Time: 05/12/23  5:04 PM   Specimen: Nasal Mucosa; Nasal Swab  Result Value Ref Range Status   MRSA, PCR NEGATIVE NEGATIVE Final   Staphylococcus aureus NEGATIVE NEGATIVE Final    Comment: (NOTE) The Xpert SA Assay (FDA approved for NASAL specimens in patients 46 years of age and older), is one component of a comprehensive surveillance program. It is not intended to diagnose infection nor to guide or monitor treatment. Performed at [facility], [address] [place], [place] [address]   Culture, blood (Routine X 2) w Reflex to ID Panel     Status: Abnormal   Collection Time: 05/16/23  2:06 AM   Specimen: BLOOD RIGHT ARM  Result Value Ref Range Status   Specimen Description BLOOD RIGHT ARM  Final   Special Requests   Final    BOTTLES DRAWN AEROBIC AND ANAEROBIC Blood Culture results may not be optimal due to an inadequate volume of blood received in culture bottles   Culture  Setup Time   Final    GRAM NEGATIVE RODS IN BOTH AEROBIC AND ANAEROBIC BOTTLES CRITICAL RESULT CALLED TO, READ BACK BY AND VERIFIED WITH: Neil Crouch [id number] AT 1741 BY EC Performed at [facility], [address], [place], [place] [address]    Culture ENTEROBACTER CLOACAE (A)  Final   Report Status 05/18/2023 FINAL  Final   Organism ID, Bacteria ENTEROBACTER CLOACAE  Final      Susceptibility   Enterobacter cloacae - MIC*    CEFEPIME <=0.12 SENSITIVE Sensitive     CEFTAZIDIME <=1 SENSITIVE Sensitive     CIPROFLOXACIN <=0.25 SENSITIVE Sensitive     GENTAMICIN <=1 SENSITIVE Sensitive     IMIPENEM 0.5 SENSITIVE Sensitive     TRIMETH/SULFA <=20 SENSITIVE Sensitive     PIP/TAZO <=4 SENSITIVE Sensitive  ug/mL    * ENTEROBACTER CLOACAE  Blood Culture ID Panel (Reflexed)     Status: Abnormal   Collection Time: 05/16/23  2:06 AM  Result Value Ref Range Status   Enterococcus faecalis NOT DETECTED NOT DETECTED Final   Enterococcus Faecium NOT DETECTED NOT DETECTED Final   Listeria monocytogenes NOT DETECTED NOT DETECTED Final   Staphylococcus species NOT DETECTED NOT DETECTED Final   Staphylococcus aureus (BCID) NOT DETECTED NOT DETECTED Final   Staphylococcus epidermidis NOT DETECTED NOT DETECTED Final   Staphylococcus lugdunensis NOT DETECTED NOT DETECTED Final   Streptococcus species NOT DETECTED NOT DETECTED Final   Streptococcus agalactiae NOT DETECTED NOT DETECTED Final   Streptococcus pneumoniae NOT DETECTED NOT DETECTED Final   Streptococcus pyogenes NOT DETECTED NOT DETECTED Final   A.calcoaceticus-baumannii NOT DETECTED NOT DETECTED Final   Bacteroides fragilis NOT DETECTED NOT DETECTED Final   Enterobacterales DETECTED (A) NOT DETECTED Final    Comment: Enterobacterales represent a large order of gram negative bacteria, not a single organism. CRITICAL RESULT CALLED TO, READ BACK BY AND VERIFIED WITH: PHARMD [name] BITONTI [phone] AT 1741 BY EC    Enterobacter cloacae complex DETECTED (A) NOT DETECTED Final    Comment: CRITICAL RESULT CALLED TO, READ BACK BY AND VERIFIED WITH: PHARMD [name] BITONTI [phone] AT 1741 BY EC    Escherichia coli NOT DETECTED NOT DETECTED Final   Klebsiella aerogenes NOT DETECTED NOT DETECTED Final   Klebsiella oxytoca NOT DETECTED NOT DETECTED Final   Klebsiella pneumoniae NOT DETECTED NOT DETECTED Final   Proteus species NOT DETECTED NOT DETECTED Final   Salmonella species NOT DETECTED NOT DETECTED Final   Serratia marcescens  NOT DETECTED NOT DETECTED Final   Haemophilus influenzae NOT DETECTED NOT DETECTED Final   Neisseria meningitidis NOT DETECTED NOT DETECTED Final   Pseudomonas aeruginosa NOT DETECTED NOT DETECTED Final    Stenotrophomonas maltophilia NOT DETECTED NOT DETECTED Final   Candida albicans NOT DETECTED NOT DETECTED Final   Candida auris NOT DETECTED NOT DETECTED Final   Candida glabrata NOT DETECTED NOT DETECTED Final   Candida krusei NOT DETECTED NOT DETECTED Final   Candida parapsilosis NOT DETECTED NOT DETECTED Final   Candida tropicalis NOT DETECTED NOT DETECTED Final   Cryptococcus neoformans/gattii NOT DETECTED NOT DETECTED Final   CTX-M ESBL NOT DETECTED NOT DETECTED Final   Carbapenem resistance IMP NOT DETECTED NOT DETECTED Final   Carbapenem resistance KPC NOT DETECTED NOT DETECTED Final   Carbapenem resistance NDM NOT DETECTED NOT DETECTED Final   Carbapenem resist OXA 48 LIKE NOT DETECTED NOT DETECTED Final   Carbapenem resistance VIM NOT DETECTED NOT DETECTED Final    Comment: Performed at [facility], [address] [place], [place] [address]  Culture, blood (Routine X 2) w Reflex to ID Panel     Status: None   Collection Time: 05/16/23  2:08 AM   Specimen: BLOOD RIGHT ARM  Result Value Ref Range Status   Specimen Description BLOOD RIGHT ARM  Final   Special Requests   Final    BOTTLES DRAWN AEROBIC AND ANAEROBIC Blood Culture adequate volume   Culture   Final    NO GROWTH 5 DAYS Performed at [facility], [address], [place], [place] [address]    Report Status 05/21/2023 FINAL  Final  Culture, blood (Routine X 2) w Reflex to ID Panel     Status: None (Preliminary result)   Collection Time: 05/19/23 11:32 AM   Specimen: BLOOD  Result Value Ref Range Status   Specimen Description BLOOD SITE NOT SPECIFIED  Final   Special Requests   Final    BOTTLES DRAWN AEROBIC AND ANAEROBIC Blood Culture results may not be optimal due to an inadequate volume of blood received in culture bottles   Culture   Final    NO GROWTH 2 DAYS Performed at [facility], [address], [place], [place] [address]    Report Status PENDING  Incomplete  Culture, blood (Routine X  2) w Reflex to ID Panel     Status: None (Preliminary result)   Collection Time: 05/19/23 11:32 AM   Specimen: BLOOD  Result Value Ref Range Status   Specimen Description BLOOD SITE NOT SPECIFIED  Final   Special Requests   Final    BOTTLES DRAWN AEROBIC AND ANAEROBIC Blood Culture results may not be optimal due to an inadequate volume of blood received in culture bottles   Culture   Final    NO GROWTH 2 DAYS Performed at [facility], [address] [place], [place] [address]    Report Status PENDING  Incomplete     Labs: BNP (last 3 results) Recent Labs    04/17/23 1217  BNP 765.1*   Basic Metabolic Panel: Recent Labs  Lab 05/15/23 0543 05/16/23 0520 05/17/23 0917 05/18/23 0809 05/19/23 0759 05/20/23 0614 05/21/23 0504  NA 132* 131* 130* 131* 129* 132* 132*  K 3.7 4.0 3.7 3.2* 3.2* 3.6 3.7  CL 97* 98 94* 97* 96* 98 101  CO2 24 25 24 23  21* 19* 20*  GLUCOSE 192* 224* 194* 215* 228* 183* 181*  BUN 28*  29* 32* 30* 27* 26* 26*  CREATININE 3.07* 3.23* 3.10* 2.83* 2.76* 2.62* 2.38*  CALCIUM 8.5* 7.8* 8.5* 8.5* 8.6* 8.9 8.7*  MG 1.8 1.8  --   --   --   --   --    Liver Function Tests: No results for input(s): "AST", "ALT", "ALKPHOS", "BILITOT", "PROT", "ALBUMIN" in the last 168 hours. No results for input(s): "LIPASE", "AMYLASE" in the last 168 hours. No results for input(s): "AMMONIA" in the last 168 hours. CBC: Recent Labs  Lab 05/16/23 0520 05/17/23 0441 05/18/23 0809 05/19/23 0759 05/20/23 0614  WBC 7.1 7.5 6.8 6.6 7.4  HGB 8.3* 8.0* 7.7* 7.5* 8.6*  HCT 24.9* 23.4* 22.9* 22.5* 25.4*  MCV 88.9 88.3 87.7 87.9 87.9  PLT 177 176 181 197 216   Cardiac Enzymes: No results for input(s): "CKTOTAL", "CKMB", "CKMBINDEX", "TROPONINI" in the last 168 hours. BNP: Invalid input(s): "POCBNP" CBG: Recent Labs  Lab 05/20/23 0853 05/20/23 1139 05/20/23 1704 05/20/23 2040 05/21/23 0619  GLUCAP 172* 128* 161* 156* 165*   D-Dimer No results for input(s):  "DDIMER" in the last 72 hours. Hgb A1c No results for input(s): "HGBA1C" in the last 72 hours. Lipid Profile No results for input(s): "CHOL", "HDL", "LDLCALC", "TRIG", "CHOLHDL", "LDLDIRECT" in the last 72 hours. Thyroid function studies No results for input(s): "TSH", "T4TOTAL", "T3FREE", "THYROIDAB" in the last 72 hours.  Invalid input(s): "FREET3" Anemia work up No results for input(s): "VITAMINB12", "FOLATE", "FERRITIN", "TIBC", "IRON", "RETICCTPCT" in the last 72 hours. Urinalysis    Component Value Date/Time   COLORURINE YELLOW 04/15/2023 1732   APPEARANCEUR CLOUDY (A) 04/15/2023 1732   LABSPEC 1.014 04/15/2023 1732   PHURINE 5.0 04/15/2023 1732   GLUCOSEU >=500 (A) 04/15/2023 1732   HGBUR MODERATE (A) 04/15/2023 1732   BILIRUBINUR NEGATIVE 04/15/2023 1732   BILIRUBINUR negative 05/12/2018 1213   BILIRUBINUR small 06/29/2014 0859   KETONESUR NEGATIVE 04/15/2023 1732   PROTEINUR >=300 (A) 04/15/2023 1732   UROBILINOGEN 0.2 05/12/2018 1213   UROBILINOGEN 1.0 05/02/2014 2015   NITRITE NEGATIVE 04/15/2023 1732   LEUKOCYTESUR NEGATIVE 04/15/2023 1732   Sepsis Labs Recent Labs  Lab 05/17/23 0441 05/18/23 0809 05/19/23 0759 05/20/23 0614  WBC 7.5 6.8 6.6 7.4   Microbiology Recent Results (from the past 240 hours)  Surgical pcr screen     Status: None   Collection Time: 05/12/23  5:04 PM   Specimen: Nasal Mucosa; Nasal Swab  Result Value Ref Range Status   MRSA, PCR NEGATIVE NEGATIVE Final   Staphylococcus aureus NEGATIVE NEGATIVE Final    Comment: (NOTE) The Xpert SA Assay (FDA approved for NASAL specimens in patients 29 years of age and older), is one component of a comprehensive surveillance program. It is not intended to diagnose infection nor to guide or monitor treatment. Performed at [facility], [address], [place], [place] [address]   Culture, blood (Routine X 2) w Reflex to ID Panel     Status: Abnormal   Collection Time: 05/16/23  2:06 AM    Specimen: BLOOD RIGHT ARM  Result Value Ref Range Status   Specimen Description BLOOD RIGHT ARM  Final   Special Requests   Final    BOTTLES DRAWN AEROBIC AND ANAEROBIC Blood Culture results may not be optimal due to an inadequate volume of blood received in culture bottles   Culture  Setup Time   Final    GRAM NEGATIVE RODS IN BOTH AEROBIC AND ANAEROBIC BOTTLES CRITICAL RESULT CALLED TO, READ BACK  BY AND VERIFIED WITH: Neil Crouch [id number] AT 1741 BY EC Performed at [facility], [address], [place], [place] [address]    Culture ENTEROBACTER CLOACAE (A)  Final   Report Status 05/18/2023 FINAL  Final   Organism ID, Bacteria ENTEROBACTER CLOACAE  Final      Susceptibility   Enterobacter cloacae - MIC*    CEFEPIME <=0.12 SENSITIVE Sensitive     CEFTAZIDIME <=1 SENSITIVE Sensitive     CIPROFLOXACIN <=0.25 SENSITIVE Sensitive     GENTAMICIN <=1 SENSITIVE Sensitive     IMIPENEM 0.5 SENSITIVE Sensitive     TRIMETH/SULFA <=20 SENSITIVE Sensitive     PIP/TAZO <=4 SENSITIVE Sensitive ug/mL    * ENTEROBACTER CLOACAE  Blood Culture ID Panel (Reflexed)     Status: Abnormal   Collection Time: 05/16/23  2:06 AM  Result Value Ref Range Status   Enterococcus faecalis NOT DETECTED NOT DETECTED Final   Enterococcus Faecium NOT DETECTED NOT DETECTED Final   Listeria monocytogenes NOT DETECTED NOT DETECTED Final   Staphylococcus species NOT DETECTED NOT DETECTED Final   Staphylococcus aureus (BCID) NOT DETECTED NOT DETECTED Final   Staphylococcus epidermidis NOT DETECTED NOT DETECTED Final   Staphylococcus lugdunensis NOT DETECTED NOT DETECTED Final   Streptococcus species NOT DETECTED NOT DETECTED Final   Streptococcus agalactiae NOT DETECTED NOT DETECTED Final   Streptococcus pneumoniae NOT DETECTED NOT DETECTED Final   Streptococcus pyogenes NOT DETECTED NOT DETECTED Final   A.calcoaceticus-baumannii NOT DETECTED NOT DETECTED Final   Bacteroides fragilis NOT DETECTED NOT  DETECTED Final   Enterobacterales DETECTED (A) NOT DETECTED Final    Comment: Enterobacterales represent a large order of gram negative bacteria, not a single organism. CRITICAL RESULT CALLED TO, READ BACK BY AND VERIFIED WITH: PHARMD [name] BITONTI [id number] AT 1741 BY EC    Enterobacter cloacae complex DETECTED (A) NOT DETECTED Final    Comment: CRITICAL RESULT CALLED TO, READ BACK BY AND VERIFIED WITH: PHARMD [name] BITONTI [id number] AT 1741 BY EC    Escherichia coli NOT DETECTED NOT DETECTED Final   Klebsiella aerogenes NOT DETECTED NOT DETECTED Final   Klebsiella oxytoca NOT DETECTED NOT DETECTED Final   Klebsiella pneumoniae NOT DETECTED NOT DETECTED Final   Proteus species NOT DETECTED NOT DETECTED Final   Salmonella species NOT DETECTED NOT DETECTED Final   Serratia marcescens NOT DETECTED NOT DETECTED Final   Haemophilus influenzae NOT DETECTED NOT DETECTED Final   Neisseria meningitidis NOT DETECTED NOT DETECTED Final   Pseudomonas aeruginosa NOT DETECTED NOT DETECTED Final   Stenotrophomonas maltophilia NOT DETECTED NOT DETECTED Final   Candida albicans NOT DETECTED NOT DETECTED Final   Candida auris NOT DETECTED NOT DETECTED Final   Candida glabrata NOT DETECTED NOT DETECTED Final   Candida krusei NOT DETECTED NOT DETECTED Final   Candida parapsilosis NOT DETECTED NOT DETECTED Final   Candida tropicalis NOT DETECTED NOT DETECTED Final   Cryptococcus neoformans/gattii NOT DETECTED NOT DETECTED Final   CTX-M ESBL NOT DETECTED NOT DETECTED Final   Carbapenem resistance IMP NOT DETECTED NOT DETECTED Final   Carbapenem resistance KPC NOT DETECTED NOT DETECTED Final   Carbapenem resistance NDM NOT DETECTED NOT DETECTED Final   Carbapenem resist OXA 48 LIKE NOT DETECTED NOT DETECTED Final   Carbapenem resistance VIM NOT DETECTED NOT DETECTED Final    Comment: Performed at [facility], [address], [place], [place] [address]  Culture, blood (Routine X 2) w Reflex to  ID Panel  Status: None   Collection Time: 05/16/23  2:08 AM   Specimen: BLOOD RIGHT ARM  Result Value Ref Range Status   Specimen Description BLOOD RIGHT ARM  Final   Special Requests   Final    BOTTLES DRAWN AEROBIC AND ANAEROBIC Blood Culture adequate volume   Culture   Final    NO GROWTH 5 DAYS Performed at [facility], [address] [place], [place] [address]    Report Status 05/21/2023 FINAL  Final  Culture, blood (Routine X 2) w Reflex to ID Panel     Status: None (Preliminary result)   Collection Time: 05/19/23 11:32 AM   Specimen: BLOOD  Result Value Ref Range Status   Specimen Description BLOOD SITE NOT SPECIFIED  Final   Special Requests   Final    BOTTLES DRAWN AEROBIC AND ANAEROBIC Blood Culture results may not be optimal due to an inadequate volume of blood received in culture bottles   Culture   Final    NO GROWTH 2 DAYS Performed at [facility], [address], [place], [place] [address]    Report Status PENDING  Incomplete  Culture, blood (Routine X 2) w Reflex to ID Panel     Status: None (Preliminary result)   Collection Time: 05/19/23 11:32 AM   Specimen: BLOOD  Result Value Ref Range Status   Specimen Description BLOOD SITE NOT SPECIFIED  Final   Special Requests   Final    BOTTLES DRAWN AEROBIC AND ANAEROBIC Blood Culture results may not be optimal due to an inadequate volume of blood received in culture bottles   Culture   Final    NO GROWTH 2 DAYS Performed at [facility], [address] [place], [place] [address]    Report Status PENDING  Incomplete     Time coordinating discharge: Over 30 minutes  SIGNED:   Azucena Fallen, DO [facility] 05/21/2023, 11:02 AM Pager   If 7PM-7AM, please contact night-coverage [email or web address]

## 2023-05-22 DIAGNOSIS — I1310 Hypertensive heart and chronic kidney disease without heart failure, with stage 1 through stage 4 chronic kidney disease, or unspecified chronic kidney disease: Secondary | ICD-10-CM | POA: Diagnosis not present

## 2023-05-22 DIAGNOSIS — E785 Hyperlipidemia, unspecified: Secondary | ICD-10-CM | POA: Diagnosis not present

## 2023-05-22 DIAGNOSIS — E1122 Type 2 diabetes mellitus with diabetic chronic kidney disease: Secondary | ICD-10-CM | POA: Diagnosis not present

## 2023-05-22 DIAGNOSIS — L89322 Pressure ulcer of left buttock, stage 2: Secondary | ICD-10-CM | POA: Diagnosis not present

## 2023-05-22 DIAGNOSIS — E1169 Type 2 diabetes mellitus with other specified complication: Secondary | ICD-10-CM | POA: Diagnosis not present

## 2023-05-22 DIAGNOSIS — J449 Chronic obstructive pulmonary disease, unspecified: Secondary | ICD-10-CM | POA: Diagnosis not present

## 2023-05-22 DIAGNOSIS — Z794 Long term (current) use of insulin: Secondary | ICD-10-CM | POA: Diagnosis not present

## 2023-05-22 DIAGNOSIS — L89312 Pressure ulcer of right buttock, stage 2: Secondary | ICD-10-CM | POA: Diagnosis not present

## 2023-05-22 DIAGNOSIS — E1151 Type 2 diabetes mellitus with diabetic peripheral angiopathy without gangrene: Secondary | ICD-10-CM | POA: Diagnosis not present

## 2023-05-22 DIAGNOSIS — M1A9XX0 Chronic gout, unspecified, without tophus (tophi): Secondary | ICD-10-CM | POA: Diagnosis not present

## 2023-05-22 DIAGNOSIS — I272 Pulmonary hypertension, unspecified: Secondary | ICD-10-CM | POA: Diagnosis not present

## 2023-05-22 DIAGNOSIS — D631 Anemia in chronic kidney disease: Secondary | ICD-10-CM | POA: Diagnosis not present

## 2023-05-22 DIAGNOSIS — K219 Gastro-esophageal reflux disease without esophagitis: Secondary | ICD-10-CM | POA: Diagnosis not present

## 2023-05-22 DIAGNOSIS — S82851D Displaced trimalleolar fracture of right lower leg, subsequent encounter for closed fracture with routine healing: Secondary | ICD-10-CM | POA: Diagnosis not present

## 2023-05-22 DIAGNOSIS — N184 Chronic kidney disease, stage 4 (severe): Secondary | ICD-10-CM | POA: Diagnosis not present

## 2023-05-22 DIAGNOSIS — J9601 Acute respiratory failure with hypoxia: Secondary | ICD-10-CM | POA: Diagnosis not present

## 2023-05-22 DIAGNOSIS — E1142 Type 2 diabetes mellitus with diabetic polyneuropathy: Secondary | ICD-10-CM | POA: Diagnosis not present

## 2023-05-22 DIAGNOSIS — I4819 Other persistent atrial fibrillation: Secondary | ICD-10-CM | POA: Diagnosis not present

## 2023-05-22 DIAGNOSIS — R29810 Facial weakness: Secondary | ICD-10-CM | POA: Diagnosis not present

## 2023-05-22 DIAGNOSIS — N179 Acute kidney failure, unspecified: Secondary | ICD-10-CM | POA: Diagnosis not present

## 2023-05-22 DIAGNOSIS — E1165 Type 2 diabetes mellitus with hyperglycemia: Secondary | ICD-10-CM | POA: Diagnosis not present

## 2023-05-22 DIAGNOSIS — E039 Hypothyroidism, unspecified: Secondary | ICD-10-CM | POA: Diagnosis not present

## 2023-05-23 DIAGNOSIS — E1142 Type 2 diabetes mellitus with diabetic polyneuropathy: Secondary | ICD-10-CM | POA: Diagnosis not present

## 2023-05-23 DIAGNOSIS — I1310 Hypertensive heart and chronic kidney disease without heart failure, with stage 1 through stage 4 chronic kidney disease, or unspecified chronic kidney disease: Secondary | ICD-10-CM | POA: Diagnosis not present

## 2023-05-23 DIAGNOSIS — L89322 Pressure ulcer of left buttock, stage 2: Secondary | ICD-10-CM | POA: Diagnosis not present

## 2023-05-23 DIAGNOSIS — D631 Anemia in chronic kidney disease: Secondary | ICD-10-CM | POA: Diagnosis not present

## 2023-05-23 DIAGNOSIS — N179 Acute kidney failure, unspecified: Secondary | ICD-10-CM | POA: Diagnosis not present

## 2023-05-23 DIAGNOSIS — E1165 Type 2 diabetes mellitus with hyperglycemia: Secondary | ICD-10-CM | POA: Diagnosis not present

## 2023-05-23 DIAGNOSIS — N184 Chronic kidney disease, stage 4 (severe): Secondary | ICD-10-CM | POA: Diagnosis not present

## 2023-05-23 DIAGNOSIS — J9601 Acute respiratory failure with hypoxia: Secondary | ICD-10-CM | POA: Diagnosis not present

## 2023-05-23 DIAGNOSIS — M1A9XX0 Chronic gout, unspecified, without tophus (tophi): Secondary | ICD-10-CM | POA: Diagnosis not present

## 2023-05-23 DIAGNOSIS — E1151 Type 2 diabetes mellitus with diabetic peripheral angiopathy without gangrene: Secondary | ICD-10-CM | POA: Diagnosis not present

## 2023-05-23 DIAGNOSIS — R29810 Facial weakness: Secondary | ICD-10-CM | POA: Diagnosis not present

## 2023-05-23 DIAGNOSIS — S82851D Displaced trimalleolar fracture of right lower leg, subsequent encounter for closed fracture with routine healing: Secondary | ICD-10-CM | POA: Diagnosis not present

## 2023-05-23 DIAGNOSIS — E039 Hypothyroidism, unspecified: Secondary | ICD-10-CM | POA: Diagnosis not present

## 2023-05-23 DIAGNOSIS — Z794 Long term (current) use of insulin: Secondary | ICD-10-CM | POA: Diagnosis not present

## 2023-05-23 DIAGNOSIS — K219 Gastro-esophageal reflux disease without esophagitis: Secondary | ICD-10-CM | POA: Diagnosis not present

## 2023-05-23 DIAGNOSIS — E785 Hyperlipidemia, unspecified: Secondary | ICD-10-CM | POA: Diagnosis not present

## 2023-05-23 DIAGNOSIS — L89312 Pressure ulcer of right buttock, stage 2: Secondary | ICD-10-CM | POA: Diagnosis not present

## 2023-05-23 DIAGNOSIS — E1169 Type 2 diabetes mellitus with other specified complication: Secondary | ICD-10-CM | POA: Diagnosis not present

## 2023-05-23 DIAGNOSIS — I272 Pulmonary hypertension, unspecified: Secondary | ICD-10-CM | POA: Diagnosis not present

## 2023-05-23 DIAGNOSIS — I4819 Other persistent atrial fibrillation: Secondary | ICD-10-CM | POA: Diagnosis not present

## 2023-05-23 DIAGNOSIS — J449 Chronic obstructive pulmonary disease, unspecified: Secondary | ICD-10-CM | POA: Diagnosis not present

## 2023-05-23 DIAGNOSIS — E1122 Type 2 diabetes mellitus with diabetic chronic kidney disease: Secondary | ICD-10-CM | POA: Diagnosis not present

## 2023-05-24 LAB — CULTURE, BLOOD (ROUTINE X 2)
Culture: NO GROWTH
Culture: NO GROWTH

## 2023-05-26 ENCOUNTER — Encounter (HOSPITAL_COMMUNITY): Payer: Self-pay | Admitting: Pharmacy Technician

## 2023-05-26 ENCOUNTER — Emergency Department (HOSPITAL_COMMUNITY)
Admission: EM | Admit: 2023-05-26 | Discharge: 2023-05-27 | Disposition: A | Discharge status: Home / Self Care | Attending: Emergency Medicine | Admitting: Emergency Medicine

## 2023-05-26 ENCOUNTER — Other Ambulatory Visit: Payer: Self-pay

## 2023-05-26 ENCOUNTER — Emergency Department (HOSPITAL_COMMUNITY)

## 2023-05-26 DIAGNOSIS — E119 Type 2 diabetes mellitus without complications: Secondary | ICD-10-CM | POA: Diagnosis not present

## 2023-05-26 DIAGNOSIS — R0602 Shortness of breath: Secondary | ICD-10-CM | POA: Insufficient documentation

## 2023-05-26 DIAGNOSIS — Z794 Long term (current) use of insulin: Secondary | ICD-10-CM | POA: Diagnosis not present

## 2023-05-26 DIAGNOSIS — R296 Repeated falls: Secondary | ICD-10-CM | POA: Insufficient documentation

## 2023-05-26 DIAGNOSIS — I4891 Unspecified atrial fibrillation: Secondary | ICD-10-CM | POA: Diagnosis not present

## 2023-05-26 DIAGNOSIS — I10 Essential (primary) hypertension: Secondary | ICD-10-CM | POA: Diagnosis not present

## 2023-05-26 DIAGNOSIS — R0902 Hypoxemia: Secondary | ICD-10-CM | POA: Diagnosis not present

## 2023-05-26 DIAGNOSIS — R0689 Other abnormalities of breathing: Secondary | ICD-10-CM | POA: Diagnosis not present

## 2023-05-26 DIAGNOSIS — R2689 Other abnormalities of gait and mobility: Secondary | ICD-10-CM | POA: Insufficient documentation

## 2023-05-26 DIAGNOSIS — R110 Nausea: Secondary | ICD-10-CM | POA: Diagnosis not present

## 2023-05-26 LAB — MAGNESIUM: Magnesium: 1.4 mg/dL — ABNORMAL LOW (ref 1.7–2.4)

## 2023-05-26 LAB — COMPREHENSIVE METABOLIC PANEL WITH GFR
ALT: 9 U/L (ref 0–44)
AST: 18 U/L (ref 15–41)
Albumin: 2.3 g/dL — ABNORMAL LOW (ref 3.5–5.0)
Alkaline Phosphatase: 111 U/L (ref 38–126)
Anion gap: 16 — ABNORMAL HIGH (ref 5–15)
BUN: 33 mg/dL — ABNORMAL HIGH (ref 8–23)
CO2: 20 mmol/L — ABNORMAL LOW (ref 22–32)
Calcium: 8.5 mg/dL — ABNORMAL LOW (ref 8.9–10.3)
Chloride: 101 mmol/L (ref 98–111)
Creatinine, Ser: 2.47 mg/dL — ABNORMAL HIGH (ref 0.61–1.24)
GFR, Estimated: 28 mL/min — ABNORMAL LOW (ref >60)
Glucose, Bld: 328 mg/dL — ABNORMAL HIGH (ref 70–99)
Potassium: 3.3 mmol/L — ABNORMAL LOW (ref 3.5–5.1)
Sodium: 137 mmol/L (ref 135–145)
Total Bilirubin: 0.9 mg/dL (ref 0.0–1.2)
Total Protein: 6.4 g/dL — ABNORMAL LOW (ref 6.5–8.1)

## 2023-05-26 LAB — CBC WITH DIFFERENTIAL/PLATELET
Abs Immature Granulocytes: 0.17 10*3/uL — ABNORMAL HIGH (ref 0.00–0.07)
Basophils Absolute: 0.1 10*3/uL (ref 0.0–0.1)
Basophils Relative: 1 %
Eosinophils Absolute: 0.1 10*3/uL (ref 0.0–0.5)
Eosinophils Relative: 1 %
HCT: 30.8 % — ABNORMAL LOW (ref 39.0–52.0)
Hemoglobin: 10 g/dL — ABNORMAL LOW (ref 13.0–17.0)
Immature Granulocytes: 2 %
Lymphocytes Relative: 9 %
Lymphs Abs: 0.9 10*3/uL (ref 0.7–4.0)
MCH: 28.7 pg (ref 26.0–34.0)
MCHC: 32.5 g/dL (ref 30.0–36.0)
MCV: 88.3 fL (ref 80.0–100.0)
Monocytes Absolute: 0.5 10*3/uL (ref 0.1–1.0)
Monocytes Relative: 5 %
Neutro Abs: 7.9 10*3/uL — ABNORMAL HIGH (ref 1.7–7.7)
Neutrophils Relative %: 82 %
Platelets: 332 10*3/uL (ref 150–400)
RBC: 3.49 MIL/uL — ABNORMAL LOW (ref 4.22–5.81)
RDW: 14.6 % (ref 11.5–15.5)
WBC: 9.6 10*3/uL (ref 4.0–10.5)
nRBC: 0 % (ref 0.0–0.2)

## 2023-05-26 LAB — PROTIME-INR
INR: 1.6 — ABNORMAL HIGH (ref 0.8–1.2)
Prothrombin Time: 19.6 s — ABNORMAL HIGH (ref 11.4–15.2)

## 2023-05-26 LAB — TROPONIN I (HIGH SENSITIVITY): Troponin I (High Sensitivity): 20 ng/L — ABNORMAL HIGH (ref <18)

## 2023-05-26 LAB — I-STAT VENOUS BLOOD GAS, ED
Acid-Base Excess: 0 mmol/L (ref 0.0–2.0)
Bicarbonate: 21.8 mmol/L (ref 20.0–28.0)
Calcium, Ion: 1.04 mmol/L — ABNORMAL LOW (ref 1.15–1.40)
HCT: 28 % — ABNORMAL LOW (ref 39.0–52.0)
Hemoglobin: 9.5 g/dL — ABNORMAL LOW (ref 13.0–17.0)
O2 Saturation: 84 %
Potassium: 3.3 mmol/L — ABNORMAL LOW (ref 3.5–5.1)
Sodium: 136 mmol/L (ref 135–145)
TCO2: 23 mmol/L (ref 22–32)
pCO2, Ven: 25.4 mmHg — ABNORMAL LOW (ref 44–60)
pH, Ven: 7.541 — ABNORMAL HIGH (ref 7.25–7.43)
pO2, Ven: 42 mmHg (ref 32–45)

## 2023-05-26 LAB — CBG MONITORING, ED: Glucose-Capillary: 274 mg/dL — ABNORMAL HIGH (ref 70–99)

## 2023-05-26 LAB — I-STAT CG4 LACTIC ACID, ED: Lactic Acid, Venous: 1.7 mmol/L (ref 0.5–1.9)

## 2023-05-26 MED ORDER — INSULIN ASPART 100 UNIT/ML IJ SOLN
7.0000 [IU] | Freq: Once | INTRAMUSCULAR | Status: AC
  Administered 2023-05-26: 7 [IU] via SUBCUTANEOUS

## 2023-05-26 MED ORDER — LACTATED RINGERS IV BOLUS
500.0000 mL | Freq: Once | INTRAVENOUS | Status: AC
  Administered 2023-05-26: 500 mL via INTRAVENOUS

## 2023-05-26 MED ORDER — CLOPIDOGREL BISULFATE 75 MG PO TABS
75.0000 mg | ORAL_TABLET | Freq: Every morning | ORAL | Status: DC
  Administered 2023-05-27: 75 mg via ORAL
  Filled 2023-05-26: qty 1

## 2023-05-26 MED ORDER — AMIODARONE HCL 200 MG PO TABS
200.0000 mg | ORAL_TABLET | Freq: Two times a day (BID) | ORAL | Status: DC
Start: 2023-05-26 — End: 2023-05-27
  Administered 2023-05-26 – 2023-05-27 (×2): 200 mg via ORAL
  Filled 2023-05-26 (×2): qty 1

## 2023-05-26 MED ORDER — GABAPENTIN 300 MG PO CAPS
300.0000 mg | ORAL_CAPSULE | Freq: Every day | ORAL | Status: DC
  Administered 2023-05-26: 300 mg via ORAL
  Filled 2023-05-26: qty 1

## 2023-05-26 MED ORDER — MAGNESIUM SULFATE 2 GM/50ML IV SOLN
2.0000 g | Freq: Once | INTRAVENOUS | Status: AC
  Administered 2023-05-26: 2 g via INTRAVENOUS
  Filled 2023-05-26: qty 50

## 2023-05-26 MED ORDER — APIXABAN 5 MG PO TABS
5.0000 mg | ORAL_TABLET | Freq: Two times a day (BID) | ORAL | Status: DC
  Administered 2023-05-26 – 2023-05-27 (×2): 5 mg via ORAL
  Filled 2023-05-26 (×2): qty 1

## 2023-05-26 MED ORDER — CIPROFLOXACIN HCL 500 MG PO TABS
500.0000 mg | ORAL_TABLET | Freq: Two times a day (BID) | ORAL | Status: DC
  Administered 2023-05-26 – 2023-05-27 (×2): 500 mg via ORAL
  Filled 2023-05-26 (×2): qty 1

## 2023-05-26 MED ORDER — POTASSIUM CHLORIDE CRYS ER 20 MEQ PO TBCR
40.0000 meq | EXTENDED_RELEASE_TABLET | Freq: Once | ORAL | Status: AC
  Administered 2023-05-26: 40 meq via ORAL
  Filled 2023-05-26: qty 2

## 2023-05-26 MED ORDER — OXYCODONE HCL 5 MG PO TABS: 5.0000 mg | ORAL_TABLET | ORAL | Status: DC | PRN

## 2023-05-26 MED ORDER — PANTOPRAZOLE SODIUM 40 MG PO TBEC
40.0000 mg | DELAYED_RELEASE_TABLET | Freq: Every day | ORAL | Status: DC
  Administered 2023-05-26 – 2023-05-27 (×2): 40 mg via ORAL
  Filled 2023-05-26 (×2): qty 1

## 2023-05-26 MED ORDER — LEVOTHYROXINE SODIUM 88 MCG PO TABS
88.0000 ug | ORAL_TABLET | Freq: Every day | ORAL | Status: DC
  Administered 2023-05-27: 88 ug via ORAL
  Filled 2023-05-26: qty 1

## 2023-05-26 MED ORDER — FUROSEMIDE 20 MG PO TABS
40.0000 mg | ORAL_TABLET | Freq: Every day | ORAL | Status: DC
  Administered 2023-05-26 – 2023-05-27 (×2): 40 mg via ORAL
  Filled 2023-05-26 (×2): qty 2

## 2023-05-26 MED ORDER — DULOXETINE HCL 30 MG PO CPEP
60.0000 mg | ORAL_CAPSULE | Freq: Every day | ORAL | Status: DC
  Administered 2023-05-26 – 2023-05-27 (×2): 60 mg via ORAL
  Filled 2023-05-26 (×2): qty 2

## 2023-05-26 MED ORDER — TAMSULOSIN HCL 0.4 MG PO CAPS
0.4000 mg | ORAL_CAPSULE | Freq: Every day | ORAL | Status: DC
Start: 2023-05-26 — End: 2023-05-27
  Administered 2023-05-26 – 2023-05-27 (×2): 0.4 mg via ORAL
  Filled 2023-05-26 (×2): qty 1

## 2023-05-26 MED ORDER — METHOCARBAMOL 500 MG PO TABS: 500.0000 mg | ORAL_TABLET | Freq: Four times a day (QID) | ORAL | Status: DC | PRN

## 2023-05-26 NOTE — ED Provider Notes (Signed)
 [place] [facility] AT [facility] Provider Note   CSN: [id number] Arrival date & time: 05/26/23  1318     History {Add pertinent medical, surgical, social history, OB history to HPI:1} Chief Complaint  Patient presents with   Shortness of Breath    Harold [name] is a 68 y.o. male.   Shortness of Breath  This patient is a 68 year old male, he has a complicated history of injury to his foot, this initially occurred in November of last year when he presented to the hospital with an open right ankle fracture and uncontrolled diabetes.  He required surgery, was admitted to the hospital in February 2025, had fever cough and myalgias and had a positive flu test.  The workup at that time revealed that he had right lower extremity wound infection and osteomyelitis of the right ankle status post removal of hardware, cultures grew antibiotics and a PICC line was placed.  The patient was in the hospital for quite some time and eventually discharged he had worsening renal function on antibiotics and did require about 4 episodes of dialysis while in the hospital.  He remained in the hospital for 44 days.  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  Of note the patient has a history of pulmonary hypertension as well.  The patient was readmitted to the hospital in March on the 24th of 2025 and required external fixation of his lower extremity and then arthrodesis of the ankle on the 27th.  He has currently been at home for the last week or so during which time he has only been in his recliner going back and forth from the recliner to the commode.  Today he decided to take a longer trip as part of his physical therapy to get from the chair around the house several times and  back during which time he became severely dyspneic and noticed that his fingers were purple and he was very short of breath.  He is not on oxygen at baseline and has no known lung disease other than pulmonary hypertension.  He is non-smoker no asthma no COPD no emphysema.  No history of blood clots.  EMS reported that his initial oxygenation's was in the 60 to 70% range on room air and he was cyanotic, he was placed on nonrebreather with normalization of his oxygen.  At this time the patient is on room air with an oxygen of 100% when I evaluate him.        Home Medications Prior to Admission medications   Medication Sig Start Date End Date Taking? Authorizing Provider  acetaminophen (TYLENOL) 500 MG tablet Take 1 tablet (500 mg total) by mouth every 6 (six) hours as needed for mild pain (pain score 1-3). 01/07/23   Hongalgi, Maximino Greenland, MD  allopurinol (ZYLOPRIM) 100 MG tablet Take 0.5 tablets (50 mg total) by mouth daily. 05/10/23   Hughie Closs, MD  amiodarone (PACERONE) 200 MG tablet Take 1 tablet (200 mg total) by mouth 2 (two) times daily. 05/10/23   Hughie Closs, MD  apixaban (ELIQUIS) 5 MG TABS tablet TAKE 1 TABLET BY MOUTH TWICE A DAY 02/17/23   Marykay Lex, MD  atorvastatin (LIPITOR) 40 MG tablet Take 1 tablet (40 mg total) by mouth daily. 05/10/23 06/09/23  Pahwani,  Daleen Bo, MD  ciprofloxacin (CIPRO) 500 MG tablet Take 1 tablet (500 mg total) by mouth 2 (two) times daily for 22 days. 05/21/23 06/12/23  Azucena Fallen, MD  clopidogrel (PLAVIX) 75 MG tablet Take 75 mg by mouth every morning. 02/04/23   [provider]  colchicine 0.6 MG tablet Take 1 tablet (0.6 mg total) by mouth every other day. 05/23/23   Azucena Fallen, MD  DULoxetine (CYMBALTA) 60 MG capsule Take 60 mg by mouth daily.    [provider]  ezetimibe (ZETIA) 10 MG tablet Take 10 mg by mouth daily. 05/09/22   [provider]  furosemide (LASIX) 80 MG tablet Take 0.5 tablets (40 mg total)  by mouth daily. May take additional tablet for weight gain of 3 pounds in one day or five pounds in one week Patient taking differently: Take 160 mg by mouth 2 (two) times daily. May take additional tablet for weight gain of 3 pounds in one day or five pounds in one week 01/14/23   [place], Alvira Philips, DO  gabapentin (NEURONTIN) 300 MG capsule Take 1 capsule (300 mg total) by mouth at bedtime. 05/10/23   Hughie Closs, MD  levothyroxine (SYNTHROID, LEVOTHROID) 88 MCG tablet Take 88 mcg by mouth daily before breakfast.    [provider]  loratadine (CLARITIN) 10 MG tablet Take 1 tablet (10 mg total) by mouth daily. 01/15/23   [place], Alvira Philips, DO  methocarbamol (ROBAXIN) 500 MG tablet Take 1 tablet (500 mg total) by mouth every 6 (six) hours as needed for muscle spasms. 05/20/23   [place] Bali, PA-C  Multiple Vitamin (MULTIVITAMIN WITH MINERALS) TABS tablet Take 1 tablet by mouth daily. 01/08/23   Hongalgi, Maximino Greenland, MD  nitroGLYCERIN (NITROSTAT) 0.4 MG SL tablet PLACE 1 TAB UNDER THE TONGUE EVERY 5 MINUTES AS NEEDED FOR CHEST PAIN (SEVERE PRESSURE OR TIGHTNESS) Patient taking differently: Place 0.4 mg under the tongue every 5 (five) minutes as needed for chest pain. PLACE 1 TAB UNDER THE TONGUE EVERY 5 MINUTES AS NEEDED FOR CHEST PAIN (SEVERE PRESSURE OR TIGHTNESS) 02/22/19   Marykay Lex, MD  oxyCODONE (OXY IR/ROXICODONE) 5 MG immediate release tablet Take 1 tablet (5 mg total) by mouth every 4 (four) hours as needed for severe pain (pain score 7-10). 05/20/23   [place] Bali, PA-C  pantoprazole (PROTONIX) 40 MG tablet TAKE 1 TABLET BY MOUTH EVERY DAY 12/13/22   Dunn, Dayna N, PA-C  SODIUM FLUORIDE 5000 PPM 1.1 % PSTE Take 1 Application by mouth in the morning and at bedtime. 02/17/23   [provider]  tamsulosin (FLOMAX) 0.4 MG CAPS capsule Take 1 capsule (0.4 mg total) by mouth daily. 05/10/23 06/09/23  Hughie Closs, MD  tirzepatide [facility]) 15 MG/0.5ML Pen Inject 15 mg into the  skin once a week. Patient taking differently: Inject 15 mg into the skin once a week. On Tuesdays 12/30/22     TRESIBA FLEXTOUCH 200 UNIT/ML FlexTouch Pen Inject 10 Units into the skin daily. 05/21/23 06/20/23  Azucena Fallen, MD  Vitamin D, Ergocalciferol, (DRISDOL) 1.25 MG (50000 UNIT) CAPS capsule Take 1 capsule (50,000 Units total) by mouth every 7 (seven) days. 05/27/23   Azucena Fallen, MD      Allergies    Doxycycline, Septra [bactrim], Penicillins, Metformin and related, Other, Sulfamethoxazole-trimethoprim, and Wellbutrin [bupropion]    Review of Systems   Review of Systems  Respiratory:  Positive for shortness of breath.   All other systems reviewed and  are negative.   Physical Exam Updated Vital Signs BP (!) 187/73   Pulse 71   Temp (!) 97.5 F (36.4 C)   Resp 12   SpO2 100%  Physical Exam Vitals and nursing note reviewed.  Constitutional:      General: He is not in acute distress.    Appearance: He is well-developed.  HENT:     Head: Normocephalic and atraumatic.     Mouth/Throat:     Pharynx: No oropharyngeal exudate.  Eyes:     General: No scleral icterus.       Right eye: No discharge.        Left eye: No discharge.     Conjunctiva/sclera: Conjunctivae normal.     Pupils: Pupils are equal, round, and reactive to light.  Neck:     Thyroid: No thyromegaly.     Vascular: No JVD.  Cardiovascular:     Rate and Rhythm: Normal rate and regular rhythm.     Heart sounds: Normal heart sounds. No murmur heard.    No friction rub. No gallop.  Pulmonary:     Effort: Pulmonary effort is normal. No respiratory distress.     Breath sounds: Normal breath sounds. No wheezing or rales.  Abdominal:     General: Bowel sounds are normal. There is no distension.     Palpations: Abdomen is soft. There is no mass.     Tenderness: There is no abdominal tenderness.  Musculoskeletal:        General: No tenderness. Normal range of motion.     Cervical back: Normal range  of motion and neck supple.     Right lower leg: Edema present.     Left lower leg: Edema present.     Comments: Right lower extremity in a splint from the proximal tibia through the foot, capillary refill of the toes evaluated approximately 3 to 4 seconds, normal sensation of the right foot at his baseline, minimal pitting edema to the bilateral lower extremities, symmetrical  Lymphadenopathy:     Cervical: No cervical adenopathy.  Skin:    General: Skin is warm and dry.     Findings: No erythema or rash.  Neurological:     Mental Status: He is alert.     Coordination: Coordination normal.  Psychiatric:        Behavior: Behavior normal.     ED Results / Procedures / Treatments   Labs (all labs ordered are listed, but only abnormal results are displayed) Labs Reviewed - No data to display  EKG EKG Interpretation Date/Time:  Monday May 26 2023 13:36:25 EDT Ventricular Rate:  68 PR Interval:  53 QRS Duration:  108 QT Interval:  410 QTC Calculation: 436 R Axis:   29  Text Interpretation: Sinus rhythm Short PR interval Repol abnrm suggests ischemia, inferior leads since last tracing no significant change Confirmed by Eber Hong ([id number]) on 05/26/2023 1:39:05 PM  Radiology No results found.  Procedures Procedures  {Document cardiac monitor, telemetry assessment procedure when appropriate:1}  Medications Ordered in ED Medications - No data to display  ED Course/ Medical Decision Making/ A&P   {   Click here for ABCD2, HEART and other calculatorsREFRESH Note before signing :1}                              Medical Decision Making   This patient presents to the ED for concern of shortness of breath, this involves  an extensive number of treatment options, and is a complaint that carries with it a high risk of complications and morbidity.  The differential diagnosis includes [place] shortness of breath, seems less likely to be PE given that he is anticoagulated, he is not  hypoxic or tachycardic or febrile.  This could be pulmonary hypertension, this could be a pneumonia or pneumothorax or pleural effusion.  This could be related to overexertion in the presence of not having done very much over the last 8 weeks since original admission.   Co morbidities that complicate the patient evaluation  Recent prolonged admissions, osteomyelitis   Additional history obtained:  Additional history obtained from see above regarding the medical record and multiple admissions as well as surgical procedures researched at length External records from outside source obtained and reviewed including above notes   Lab Tests:  I Ordered, and personally interpreted labs.  The pertinent results include:  ***   Imaging Studies ordered:  I ordered imaging studies including ***  I independently visualized and interpreted imaging which showed *** I agree with the radiologist interpretation   Cardiac Monitoring: / EKG:  The patient was maintained on a cardiac monitor.  I personally viewed and interpreted the cardiac monitored which showed an underlying rhythm of: ***   Problem List / ED Course / Critical interventions / Medication management  *** I ordered medication including ***  for ***  Reevaluation of the patient after these medicines showed that the patient {resolved/improved/worsened:23923::"improved"} I have reviewed the patients home medicines and have made adjustments as needed   Consultations Obtained:  I requested consultation with the ***,  and discussed lab and imaging findings as well as pertinent plan - they recommend: ***   Social Determinants of Health:  ***   Test / Admission - Considered:  ***   {Document critical care time when appropriate:1} {Document review of labs and clinical decision tools ie heart score, Chads2Vasc2 etc:1}  {Document your independent review of radiology images, and any outside records:1} {Document your discussion  with family members, caretakers, and with consultants:1} {Document social determinants of health affecting pt's care:1} {Document your decision making why or why not admission, treatments were needed:1} Final Clinical Impression(s) / ED Diagnoses Final diagnoses:  None    Rx / DC Orders ED Discharge Orders     None

## 2023-05-26 NOTE — [place] FL2 (Signed)
 [place] MEDICAID FL2 LEVEL OF CARE FORM     IDENTIFICATION  Patient Name: Harold [name] Birthdate: 1955-05-25 Sex: male Admission Date (Current Location): 05/26/2023  [facility] [place] and [place] Number:  Producer, television/film/video and Address:  [facility], [address], [place], [place] [address]      Provider Number: [id number]  Attending Physician Name and Address:  System, Provider Not In  Relative Name and Phone Number:  Benton-Elliot,George (Spouse)  [phone] (Mobile)    Current Level of Care: Hospital Recommended Level of Care: Skilled Nursing Facility Prior Approval Number:    Date Approved/Denied: 05/15/23 PASRR Number: [id number]  Discharge Plan: SNF    Current Diagnoses: Patient Active Problem List   Diagnosis Date Noted   Protein-calorie malnutrition, severe 05/21/2023   Open right ankle fracture, sequela 05/12/2023   Obesity, Class II, BMI 35-39.9 04/23/2023   Osteomyelitis (HCC) 04/17/2023   Cellulitis of right lower extremity 03/28/2023   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, right (HCC) 03/28/2023   Abscess of skin of right ankle 03/28/2023   PAD (peripheral artery disease) (HCC) 03/28/2023   Vitamin D deficiency 01/07/2023   Prolonged QT interval 01/02/2023   Acute kidney injury superimposed on stage 4 chronic kidney disease (HCC) 01/02/2023   Normocytic anemia 01/02/2023   Paroxysmal atrial fibrillation (HCC) 01/02/2023   Uncontrolled type 2 diabetes mellitus with hyperglycemia, with long-term current use of insulin (HCC) 01/02/2023   Acquired hypothyroidism 01/02/2023   CKD stage 3b, GFR 30-44 ml/min (HCC) 11/07/2022   CAD in native artery 11/07/2022   PVD (peripheral vascular disease) (HCC) 11/06/2022   Hypercoagulable state due to persistent atrial fibrillation (HCC) 02/19/2022   Persistent atrial fibrillation (HCC) 01/28/2022   Lumbar stenosis with neurogenic claudication 07/26/2020   Diabetic neuropathy  (HCC) 08/24/2019   Trigger thumb of left hand 09/02/2018   Atherosclerosis of native artery of both lower extremities with intermittent claudication (HCC) 05/12/2018   Pain of left hand 03/26/2018   Trigger finger 03/03/2017   Pain in finger of left hand 03/03/2017   Snoring 08/20/2016   Morbid (severe) obesity due to excess calories (HCC) 04/18/2016   Venous stasis of both lower extremities - with edema 02/25/2015   Right-sided chest wall pain 05/08/2014   Gastroesophageal reflux disease without esophagitis 10/10/2013   Chronic heart failure with preserved ejection fraction (HFpEF) (HCC) 06/27/2013   COPD (chronic obstructive pulmonary disease) (HCC) 06/17/2013   Restrictive lung disease 06/17/2013   Obesity, Class III, BMI 40-49.9 (morbid obesity) (HCC) 09/01/2012   Generalized weakness 09/01/2012   Chronic renal disease, stage IV (HCC) 04/30/2012   OSA on CPAP 04/29/2012   Pulmonary hypertension, 04/29/2012   Dyspnea on exertion   04/26/2012   CAD, LAD/CFX DES 04/28/12- staged RCA DES 04/29/12 after pt declined CABG, cath 05/14/13 stable CAD medical therapy    Carotid artery stenosis without cerebral infarction, bilateral 11/07/2011   Diabetes mellitus type 2 with neurological manifestations (HCC) 04/29/2011   Essential hypertension 04/29/2011   Neuropathy 04/29/2011   Hyperlipidemia associated with type 2 diabetes mellitus (HCC) 04/29/2011   Anxiety and depression 04/29/2011    Orientation RESPIRATION BLADDER Height & Weight     Self, Time, Situation, Place  O2 Continent Weight:   Height:     BEHAVIORAL SYMPTOMS/MOOD NEUROLOGICAL BOWEL NUTRITION STATUS      Incontinent Diet  AMBULATORY STATUS COMMUNICATION OF NEEDS Skin   Total Care Verbally Surgical wounds, Skin abrasions, Other (Comment) (ecchymosis)  Personal Care Assistance Level of Assistance  Bathing, Dressing, Feeding, Total care Bathing Assistance: Maximum assistance Feeding  assistance: Limited assistance Dressing Assistance: Maximum assistance Total Care Assistance: Maximum assistance   Functional Limitations Info  Sight (Wear glasses) Sight Info: Adequate (Glasses) Hearing Info: Adequate Speech Info: Adequate    SPECIAL CARE FACTORS FREQUENCY  PT (By licensed PT), OT (By licensed OT)     PT Frequency: 5x/week OT Frequency: 5x/week            Contractures Contractures Info: Not present    Additional Factors Info  Code Status, Allergies Code Status Info: Full Allergies Info: Doxycycline, Septra (Bactrim), Penicillins, Metformin And Related, Other, Sulfamethoxazole-trimethoprim, Wellbutrin (Bupropion)           Current Medications (05/26/2023):  This is the current hospital active medication list Current Facility-Administered Medications  Medication Dose Route Frequency Provider Last Rate Last Admin   amiodarone (PACERONE) tablet 200 mg  200 mg Oral BID Gloris Manchester, MD       apixaban Everlene Balls) tablet 5 mg  5 mg Oral BID Gloris Manchester, MD       ciprofloxacin (CIPRO) tablet 500 mg  500 mg Oral BID Gloris Manchester, MD   500 mg at 05/26/23 1955   [START ON 05/27/2023] clopidogrel (PLAVIX) tablet 75 mg  75 mg Oral q morning Gloris Manchester, MD       DULoxetine (CYMBALTA) DR capsule 60 mg  60 mg Oral Daily Gloris Manchester, MD   60 mg at 05/26/23 1955   furosemide (LASIX) tablet 40 mg  40 mg Oral Daily Gloris Manchester, MD   40 mg at 05/26/23 1955   gabapentin (NEURONTIN) capsule 300 mg  300 mg Oral QHS Gloris Manchester, MD       Melene Muller ON 05/27/2023] levothyroxine (SYNTHROID) tablet 88 mcg  88 mcg Oral QAC breakfast Gloris Manchester, MD       methocarbamol (ROBAXIN) tablet 500 mg  500 mg Oral Q6H PRN Gloris Manchester, MD       oxyCODONE (Oxy IR/ROXICODONE) immediate release tablet 5 mg  5 mg Oral Q4H PRN Gloris Manchester, MD       pantoprazole (PROTONIX) EC tablet 40 mg  40 mg Oral Daily Gloris Manchester, MD   40 mg at 05/26/23 1955   tamsulosin (FLOMAX) capsule 0.4 mg  0.4 mg Oral Daily Gloris Manchester, MD   0.4 mg at 05/26/23 1955   Current Outpatient Medications  Medication Sig Dispense Refill   acetaminophen (TYLENOL) 500 MG tablet Take 1 tablet (500 mg total) by mouth every 6 (six) hours as needed for mild pain (pain score 1-3).     allopurinol (ZYLOPRIM) 100 MG tablet Take 0.5 tablets (50 mg total) by mouth daily. 15 tablet 0   amiodarone (PACERONE) 200 MG tablet Take 1 tablet (200 mg total) by mouth 2 (two) times daily. 60 tablet 0   apixaban (ELIQUIS) 5 MG TABS tablet TAKE 1 TABLET BY MOUTH TWICE A DAY 60 tablet 6   atorvastatin (LIPITOR) 40 MG tablet Take 1 tablet (40 mg total) by mouth daily. 30 tablet 0   ciprofloxacin (CIPRO) 500 MG tablet Take 1 tablet (500 mg total) by mouth 2 (two) times daily for 22 days. 44 tablet 0   clopidogrel (PLAVIX) 75 MG tablet Take 75 mg by mouth every morning.     colchicine 0.6 MG tablet Take 1 tablet (0.6 mg total) by mouth every other day. 10 tablet 0   DULoxetine (CYMBALTA) 60 MG capsule Take 60  mg by mouth daily.     ezetimibe (ZETIA) 10 MG tablet Take 10 mg by mouth daily.     furosemide (LASIX) 80 MG tablet Take 0.5 tablets (40 mg total) by mouth daily. May take additional tablet for weight gain of 3 pounds in one day or five pounds in one week (Patient taking differently: Take 160 mg by mouth 2 (two) times daily. May take additional tablet for weight gain of 3 pounds in one day or five pounds in one week)     gabapentin (NEURONTIN) 300 MG capsule Take 1 capsule (300 mg total) by mouth at bedtime. 30 capsule 0   levothyroxine (SYNTHROID, LEVOTHROID) 88 MCG tablet Take 88 mcg by mouth daily before breakfast.     loratadine (CLARITIN) 10 MG tablet Take 1 tablet (10 mg total) by mouth daily.     methocarbamol (ROBAXIN) 500 MG tablet Take 1 tablet (500 mg total) by mouth every 6 (six) hours as needed for muscle spasms. 28 tablet 0   Multiple Vitamin (MULTIVITAMIN WITH MINERALS) TABS tablet Take 1 tablet by mouth daily.     nitroGLYCERIN  (NITROSTAT) 0.4 MG SL tablet PLACE 1 TAB UNDER THE TONGUE EVERY 5 MINUTES AS NEEDED FOR CHEST PAIN (SEVERE PRESSURE OR TIGHTNESS) (Patient taking differently: Place 0.4 mg under the tongue every 5 (five) minutes as needed for chest pain. PLACE 1 TAB UNDER THE TONGUE EVERY 5 MINUTES AS NEEDED FOR CHEST PAIN (SEVERE PRESSURE OR TIGHTNESS)) 25 tablet 3   oxyCODONE (OXY IR/ROXICODONE) 5 MG immediate release tablet Take 1 tablet (5 mg total) by mouth every 4 (four) hours as needed for severe pain (pain score 7-10). 42 tablet 0   pantoprazole (PROTONIX) 40 MG tablet TAKE 1 TABLET BY MOUTH EVERY DAY 90 tablet 3   SODIUM FLUORIDE 5000 PPM 1.1 % PSTE Take 1 Application by mouth in the morning and at bedtime.     tamsulosin (FLOMAX) 0.4 MG CAPS capsule Take 1 capsule (0.4 mg total) by mouth daily. 30 capsule 0   tirzepatide (MOUNJARO) 15 MG/0.5ML Pen Inject 15 mg into the skin once a week. (Patient taking differently: Inject 15 mg into the skin once a week. On Tuesdays) 6 mL 3   TRESIBA FLEXTOUCH 200 UNIT/ML FlexTouch Pen Inject 10 Units into the skin daily. 1.5 mL 0   [START ON 05/27/2023] Vitamin D, Ergocalciferol, (DRISDOL) 1.25 MG (50000 UNIT) CAPS capsule Take 1 capsule (50,000 Units total) by mouth every 7 (seven) days. 4 capsule 0     Discharge Medications: Please see discharge summary for a list of discharge medications.  Relevant Imaging Results:  Relevant Lab Results:   Additional Information SSN; [id number]  .Lily Peer, MSW, LCSWA Transition of Care  Clinical Social Worker (ED 3-11 Mon-Fri)  [phone]

## 2023-05-26 NOTE — ED Triage Notes (Signed)
 Pt bib ems with shob. Initial oxygen saturations of 60-70% on RA and noticeably cyanotic. Placed on NRB with improvement to 90's. Pt denies any lung hx. Recent surgery to R ankle.

## 2023-05-26 NOTE — ED Notes (Addendum)
 Patient given sandwich bag and water per request. Patient's leg also wrapped with ace bandage per verbal order from MD Dixon.

## 2023-05-26 NOTE — ED Provider Notes (Signed)
 Care of patient assumed from Dr. Hyacinth Meeker.  This patient presents with shortness of breath in the setting of aggressive PT session today.  Although he was reportedly hypoxic on scene, he has been 100% SpO2 on room air while in the ED.  He is on Eliquis and there is low suspicion of PE.  If lab work is very urine, plan for discharge home with progressive PT. Physical Exam  BP (!) 179/65   Pulse 64   Temp (!) 97.5 F (36.4 C)   Resp (!) 22   SpO2 100%   Physical Exam Vitals and nursing note reviewed.  Constitutional:      General: He is not in acute distress.    Appearance: He is well-developed. He is not ill-appearing, toxic-appearing or diaphoretic.  HENT:     Head: Normocephalic and atraumatic.     Mouth/Throat:     Mouth: Mucous membranes are moist.  Eyes:     Conjunctiva/sclera: Conjunctivae normal.  Cardiovascular:     Rate and Rhythm: Normal rate and regular rhythm.  Pulmonary:     Effort: Pulmonary effort is normal. No respiratory distress.  Abdominal:     Palpations: Abdomen is soft.     Tenderness: There is no abdominal tenderness.  Musculoskeletal:        General: No swelling.     Cervical back: Normal range of motion and neck supple.     Comments: Bandaging present on the right foot and ankle.  Skin:    General: Skin is warm and dry.     Coloration: Skin is not cyanotic or pale.  Neurological:     General: No focal deficit present.     Mental Status: He is alert and oriented to person, place, and time.  Psychiatric:        Mood and Affect: Mood normal.        Behavior: Behavior normal.     Procedures  Procedures  ED Course / MDM    Medical Decision Making Amount and/or Complexity of Data Reviewed Labs: ordered. Radiology: ordered.  Risk Prescription drug management.   Patient remained satting 100% on room air.  He was given some IV fluids and insulin for treatment of his hyperglycemia.  Further lab work showed hypomagnesemia and hypokalemia.   Replacement electrolytes were ordered.  Patient reports that his sugars have been elevated at home.  He has a history of orthostatic near syncope and was previously on midodrine.  This medication has been discontinued.  He reports some recent return of orthostatic near syncope.  Here in the ED, his blood pressures while supine are elevated.  I suspect that his orthostatic symptoms are secondary to volume depletion from his Lasix and hyperglycemia.  Patient was advised to take an increased fluids to replace fluid losses.  He does state that he has been adherent to his Eliquis.  I spoke with his husband as well as his home health nurse.  Both are concerned about his very poor mobility.  He has been requiring greater than 1 person assist with transfers.  Home health nurse reports that she is not capable of taking care of him at home.  His partner reports the same.  Patient to remain in the ED for physical therapy evaluation and social work consult.  Home medications were ordered.       Gloris Manchester, MD 05/26/23 [phone]

## 2023-05-26 NOTE — TOC CM/SW Note (Addendum)
 SW met with patient at bedside, patient was alert/orient x3. Patient was lying in bed, he was able to answer all questions without barriers. Patient was discharged on 4/2 with home health SN/PT with [facility]. Patient stated his nurse and PT comes out 1x/week.   Patient is a type 2 diabetic, he has a hx of obesity, CKD, Neuropathy, depression and anxiety. Patient lives with his spouse [name].   Patient stated he has been to [facility] in the past and was their for 15 days. He stated his payor source [facility] declined to pay to cover for additional days, as well [facility] has refused to cover SNF and probably not able to go back to [place] because unpaid bills but allegedly. Patient is agreeable to SNF. PT/OT consult placed.   FL2 has been completed. PASSAR [id number] [id number].   Lily Peer, MSW, LCSWA Transition of Care  Clinical Social Worker (ED 3-11 Mon-Fri)  [phone]

## 2023-05-27 NOTE — Inpatient Diabetes Management (Signed)
 Inpatient Diabetes Program Recommendations  AACE/ADA: New Consensus Statement on Inpatient Glycemic Control (2015)  Target Ranges:  Prepandial:   less than 140 mg/dL      Peak postprandial:   less than 180 mg/dL (1-2 hours)      Critically ill patients:  140 - 180 mg/dL   Lab Results  Component Value Date   GLUCAP 274 (H) 05/26/2023   HGBA1C 7.0 (H) 01/02/2023    Review of Glycemic Control  Diabetes history: DM2 Outpatient Diabetes medications: Tresiba 10 units QD Current orders for Inpatient glycemic control: Novolog 7 units x 1 on 05/26/23  Inpatient Diabetes Program Recommendations:    If patient remains in ED, please consider:  Novolog 0-9 units TID and 0-5 units at bedtime.  Will continue to follow while inpatient.  Thank you, Dulce Sellar, MSN, CDCES Diabetes Coordinator Inpatient Diabetes Program [phone] (team pager from 8a-5p)

## 2023-05-27 NOTE — Progress Notes (Addendum)
 9:10am: CSW received message from PT stating SNF is being recommended however if unable to be obtained Harold [name] will need a hoyer lift at home.  CSW spoke with Harold [name]'s [name] to discuss recommendations. Harold [name] states the home he and the Harold [name] share is not big enough to support a hoyer lift. Harold [name] states the local fire department has attempted to put a hoyer lift in the home several times and were unsuccessful. Harold [name] states he has obtained a transfer chair for Harold [name] to use in the home. Harold [name] states Harold [name] was previously at [facility] for 19 days and was discharged after being told the copay per day was over $600. Harold [name] states the Harold [name] has applied for Medicaid four times and has been denied every time. Harold [name] states Harold [name]'s PCP is [facility] and that provider makes house calls. Harold [name] confirms Harold [name] is active with [facility] for home health services. Harold [name] states the PCP has discussed possible hospice with Harold [name] but he is not agreeable for those services at this time. Harold [name] states he will come and pick Harold [name] up at 12pm today - Harold [name] will contact ED upon his arrival to have Harold [name] brought outside to the car. Harold [name] had no further questions at this time.  CSW informed MD and RN of discharge plan.  8am: Harold [name] was recently discharged home on 4/2 from 5N with [facility] services through [facility].  Harold [name] is currently waiting on a new PT evaluation to determine if a higher level of care is needed.  Edwin Dada, MSW, LCSW Transitions of Care  Clinical Social Worker II [phone]

## 2023-05-27 NOTE — Evaluation (Signed)
 Physical Therapy Evaluation Patient Details Name: Harold [name] MRN: [id number] DOB: 1955-06-25 Today's Date: 05/27/2023  History of Present Illness  68 y.o. male presents to [facility] hospital on 05/26/2023 due to DOE and reported hypoxia after HHPT session. Pt was recently discharged on 05/22/2023 after management of R ankle fx. PMH: HTN, HLD, CAD s/p PCI, COPD, DM2, CKD stage IIIb, PVD, gout, OSA, neuropathy, COPD, a-fib, CAD s/p percutaneous coronary angioplasty, angina, obesity  Clinical Impression  Pt presents to PT with deficits in functional mobility, gait, balance, strength, power, endurance. Pt requires significant physical assistance for transfer attempts at this time due to persistent posterior lean and generalized weakness. Pt with orthostatic hypotension accompanied by symptoms of dizziness, limiting mobility tolerance. Pt remains at a high risk for falls and injury due to posterior lean in standing and orthostatic hypotension. Patient will benefit from continued inpatient follow up therapy, <3 hours/day.        If plan is discharge home, recommend the following: Assistance with cooking/housework;Assist for transportation;Help with stairs or ramp for entrance;Two people to help with walking and/or transfers;Two people to help with bathing/dressing/bathroom   Can travel by private vehicle   Yes    Equipment Recommendations Michiel Sites lift  Recommendations for Other Services       Functional Status Assessment Patient has had a recent decline in their functional status and demonstrates the ability to make significant improvements in function in a reasonable and predictable amount of time.     Precautions / Restrictions Precautions Precautions: Fall Recall of Precautions/Restrictions: Impaired Precaution/Restrictions Comments: orthostatic hypotension and history of syncope Required Braces or Orthoses: Splint/Cast Splint/Cast: RLE splint Other Brace: pt without abdominal binder, reports  not often wearing it Restrictions Weight Bearing Restrictions Per Provider Order: Yes RLE Weight Bearing Per Provider Order: Touchdown weight bearing (TDWB through R toes for transfers only) Other Position/Activity Restrictions: pt allowed to Wb through toes to transfer only (orders states WB RLE transfers only, however PA verbally stated to pt he can only put weight through his toes)      Mobility  Bed Mobility Overal bed mobility: Needs Assistance Bed Mobility: Rolling, Sidelying to Sit, Sit to Sidelying Rolling: Contact guard assist Sidelying to sit: Min assist, HOB elevated, Used rails     Sit to sidelying: Min assist, Used rails      Transfers Overall transfer level: Needs assistance Equipment used: Rolling walker (2 wheels) Transfers: Sit to/from Stand Sit to Stand: Mod assist, From elevated surface           General transfer comment: posterior lean with 2 standing attempts, unable to progress to pivot transfers due to posterior lean and instability    Ambulation/Gait                  Stairs            Wheelchair Mobility     Tilt Bed    Modified Rankin (Stroke Patients Only)       Balance Overall balance assessment: Needs assistance Sitting-balance support: No upper extremity supported, Feet supported Sitting balance-Leahy Scale: Fair     Standing balance support: Bilateral upper extremity supported, Reliant on assistive device for balance Standing balance-Leahy Scale: Poor Standing balance comment: modA, posterior lean                             Pertinent Vitals/Pain Pain Assessment Pain Assessment: No/denies pain    Home Living Family/patient expects  to be discharged to:: Private residence Living Arrangements: Spouse/significant other Available Help at Discharge: Family Type of Home: House Home Access: Ramped entrance       Home Layout: One level Home Equipment: Pharmacist, hospital (2 wheels);Transport  chair;Grab bars - toilet;Grab bars - tub/shower;BSC/3in1;Wheelchair - manual Additional Comments: Pt reports spouse works from home and can assist as needed    Prior Function Prior Level of Function : History of Falls (last six months);Needs assist             Mobility Comments: pt with many falls, reports episodes of syncope. Since ankle fx pt had been transferring some with assistance of spouse. Was working on stand pivot transfers with HHPT ADLs Comments: amy in-house RN comes at least 1x/week for 2-3 hours to change bandage and washing. has been getting HHPT as well     Extremity/Trunk Assessment   Upper Extremity Assessment Upper Extremity Assessment: Overall WFL for tasks assessed    Lower Extremity Assessment Lower Extremity Assessment: Generalized weakness    Cervical / Trunk Assessment Cervical / Trunk Assessment: Kyphotic  Communication   Communication Communication: No apparent difficulties    Cognition Arousal: Alert Behavior During Therapy: WFL for tasks assessed/performed   PT - Cognitive impairments: Safety/Judgement                       PT - Cognition Comments: pt reports WBing through toes only but appears to apply pressure through heel with standing attempts Following commands: Intact Following commands impaired: Follows one step commands with increased time     Cueing Cueing Techniques: Verbal cues     General Comments General comments (skin integrity, edema, etc.): pt with orthostatic hypotension during session. BP pre-mobility at 160/70, and later 86/61 in sitting. Pt with symptoms of dizziness.    Exercises     Assessment/Plan    PT Assessment Patient needs continued PT services  PT Problem List Decreased strength;Decreased range of motion;Decreased activity tolerance;Decreased balance;Decreased mobility;Decreased coordination;Decreased cognition;Decreased knowledge of use of DME;Decreased safety awareness;Decreased knowledge of  precautions;Cardiopulmonary status limiting activity;Obesity       PT Treatment Interventions DME instruction;Gait training;Functional mobility training;Therapeutic activities;Therapeutic exercise;Balance training;Neuromuscular re-education;Patient/family education    PT Goals (Current goals can be found in the Care Plan section)  Acute Rehab PT Goals Patient Stated Goal: to reduce falls risk PT Goal Formulation: With patient Time For Goal Achievement: 06/10/23 Potential to Achieve Goals: Fair    Frequency Min 3X/week     Co-evaluation               AM-PAC PT "6 Clicks" Mobility  Outcome Measure Help needed turning from your back to your side while in a flat bed without using bedrails?: A Little Help needed moving from lying on your back to sitting on the side of a flat bed without using bedrails?: A Little Help needed moving to and from a bed to a chair (including a wheelchair)?: A Lot Help needed standing up from a chair using your arms (e.g., wheelchair or bedside chair)?: A Lot Help needed to walk in hospital room?: Total Help needed climbing 3-5 steps with a railing? : Total 6 Click Score: 12    End of Session Equipment Utilized During Treatment: Gait belt Activity Tolerance: Patient limited by fatigue;Treatment limited secondary to medical complications (Comment) (orthostatic hypotension) Patient left: in bed;with call bell/phone within reach Nurse Communication: Mobility status PT Visit Diagnosis: Other abnormalities of gait and mobility (R26.89);Muscle weakness (generalized) (  M62.81);Repeated falls (R29.6)    Time: 2130-8657 PT Time Calculation (min) (ACUTE ONLY): 30 min   Charges:   PT Evaluation $PT Eval Low Complexity: 1 Low   PT General Charges $$ ACUTE PT VISIT: 1 Visit         Arlyss Gandy, PT, DPT Acute Rehabilitation Office [phone]   Arlyss Gandy 05/27/2023, 9:11 AM

## 2023-05-27 NOTE — ED Provider Notes (Signed)
 Per social work patient ready for discharge.   Gerhard Munch, MD 05/27/23 [phone]

## 2023-05-28 DIAGNOSIS — L89312 Pressure ulcer of right buttock, stage 2: Secondary | ICD-10-CM | POA: Diagnosis not present

## 2023-05-28 DIAGNOSIS — L89322 Pressure ulcer of left buttock, stage 2: Secondary | ICD-10-CM | POA: Diagnosis not present

## 2023-05-28 DIAGNOSIS — J449 Chronic obstructive pulmonary disease, unspecified: Secondary | ICD-10-CM | POA: Diagnosis not present

## 2023-05-28 DIAGNOSIS — E1165 Type 2 diabetes mellitus with hyperglycemia: Secondary | ICD-10-CM | POA: Diagnosis not present

## 2023-05-28 DIAGNOSIS — E1169 Type 2 diabetes mellitus with other specified complication: Secondary | ICD-10-CM | POA: Diagnosis not present

## 2023-05-28 DIAGNOSIS — J9601 Acute respiratory failure with hypoxia: Secondary | ICD-10-CM | POA: Diagnosis not present

## 2023-05-28 DIAGNOSIS — E039 Hypothyroidism, unspecified: Secondary | ICD-10-CM | POA: Diagnosis not present

## 2023-05-28 DIAGNOSIS — S82851D Displaced trimalleolar fracture of right lower leg, subsequent encounter for closed fracture with routine healing: Secondary | ICD-10-CM | POA: Diagnosis not present

## 2023-05-28 DIAGNOSIS — Z794 Long term (current) use of insulin: Secondary | ICD-10-CM | POA: Diagnosis not present

## 2023-05-28 DIAGNOSIS — M1A9XX0 Chronic gout, unspecified, without tophus (tophi): Secondary | ICD-10-CM | POA: Diagnosis not present

## 2023-05-28 DIAGNOSIS — I272 Pulmonary hypertension, unspecified: Secondary | ICD-10-CM | POA: Diagnosis not present

## 2023-05-28 DIAGNOSIS — N179 Acute kidney failure, unspecified: Secondary | ICD-10-CM | POA: Diagnosis not present

## 2023-05-28 DIAGNOSIS — K219 Gastro-esophageal reflux disease without esophagitis: Secondary | ICD-10-CM | POA: Diagnosis not present

## 2023-05-28 DIAGNOSIS — I4819 Other persistent atrial fibrillation: Secondary | ICD-10-CM | POA: Diagnosis not present

## 2023-05-28 DIAGNOSIS — E1142 Type 2 diabetes mellitus with diabetic polyneuropathy: Secondary | ICD-10-CM | POA: Diagnosis not present

## 2023-05-28 DIAGNOSIS — E1122 Type 2 diabetes mellitus with diabetic chronic kidney disease: Secondary | ICD-10-CM | POA: Diagnosis not present

## 2023-05-28 DIAGNOSIS — E785 Hyperlipidemia, unspecified: Secondary | ICD-10-CM | POA: Diagnosis not present

## 2023-05-28 DIAGNOSIS — R29810 Facial weakness: Secondary | ICD-10-CM | POA: Diagnosis not present

## 2023-05-28 DIAGNOSIS — E1151 Type 2 diabetes mellitus with diabetic peripheral angiopathy without gangrene: Secondary | ICD-10-CM | POA: Diagnosis not present

## 2023-05-28 DIAGNOSIS — D631 Anemia in chronic kidney disease: Secondary | ICD-10-CM | POA: Diagnosis not present

## 2023-05-28 DIAGNOSIS — I1310 Hypertensive heart and chronic kidney disease without heart failure, with stage 1 through stage 4 chronic kidney disease, or unspecified chronic kidney disease: Secondary | ICD-10-CM | POA: Diagnosis not present

## 2023-05-28 DIAGNOSIS — N184 Chronic kidney disease, stage 4 (severe): Secondary | ICD-10-CM | POA: Diagnosis not present

## 2023-05-29 ENCOUNTER — Inpatient Hospital Stay: Payer: Self-pay | Admitting: Infectious Diseases

## 2023-05-29 DIAGNOSIS — E1165 Type 2 diabetes mellitus with hyperglycemia: Secondary | ICD-10-CM | POA: Diagnosis not present

## 2023-05-29 DIAGNOSIS — E1169 Type 2 diabetes mellitus with other specified complication: Secondary | ICD-10-CM | POA: Diagnosis not present

## 2023-05-29 DIAGNOSIS — J449 Chronic obstructive pulmonary disease, unspecified: Secondary | ICD-10-CM | POA: Diagnosis not present

## 2023-05-29 DIAGNOSIS — I272 Pulmonary hypertension, unspecified: Secondary | ICD-10-CM | POA: Diagnosis not present

## 2023-05-29 DIAGNOSIS — L89322 Pressure ulcer of left buttock, stage 2: Secondary | ICD-10-CM | POA: Diagnosis not present

## 2023-05-29 DIAGNOSIS — J9601 Acute respiratory failure with hypoxia: Secondary | ICD-10-CM | POA: Diagnosis not present

## 2023-05-29 DIAGNOSIS — S82851D Displaced trimalleolar fracture of right lower leg, subsequent encounter for closed fracture with routine healing: Secondary | ICD-10-CM | POA: Diagnosis not present

## 2023-05-29 DIAGNOSIS — D631 Anemia in chronic kidney disease: Secondary | ICD-10-CM | POA: Diagnosis not present

## 2023-05-29 DIAGNOSIS — E785 Hyperlipidemia, unspecified: Secondary | ICD-10-CM | POA: Diagnosis not present

## 2023-05-29 DIAGNOSIS — E1151 Type 2 diabetes mellitus with diabetic peripheral angiopathy without gangrene: Secondary | ICD-10-CM | POA: Diagnosis not present

## 2023-05-29 DIAGNOSIS — E1122 Type 2 diabetes mellitus with diabetic chronic kidney disease: Secondary | ICD-10-CM | POA: Diagnosis not present

## 2023-05-29 DIAGNOSIS — E1142 Type 2 diabetes mellitus with diabetic polyneuropathy: Secondary | ICD-10-CM | POA: Diagnosis not present

## 2023-05-29 DIAGNOSIS — I1310 Hypertensive heart and chronic kidney disease without heart failure, with stage 1 through stage 4 chronic kidney disease, or unspecified chronic kidney disease: Secondary | ICD-10-CM | POA: Diagnosis not present

## 2023-05-29 DIAGNOSIS — Z794 Long term (current) use of insulin: Secondary | ICD-10-CM | POA: Diagnosis not present

## 2023-05-29 DIAGNOSIS — M1A9XX0 Chronic gout, unspecified, without tophus (tophi): Secondary | ICD-10-CM | POA: Diagnosis not present

## 2023-05-29 DIAGNOSIS — R29810 Facial weakness: Secondary | ICD-10-CM | POA: Diagnosis not present

## 2023-05-29 DIAGNOSIS — N179 Acute kidney failure, unspecified: Secondary | ICD-10-CM | POA: Diagnosis not present

## 2023-05-29 DIAGNOSIS — E039 Hypothyroidism, unspecified: Secondary | ICD-10-CM | POA: Diagnosis not present

## 2023-05-29 DIAGNOSIS — N184 Chronic kidney disease, stage 4 (severe): Secondary | ICD-10-CM | POA: Diagnosis not present

## 2023-05-29 DIAGNOSIS — L89312 Pressure ulcer of right buttock, stage 2: Secondary | ICD-10-CM | POA: Diagnosis not present

## 2023-05-29 DIAGNOSIS — I4819 Other persistent atrial fibrillation: Secondary | ICD-10-CM | POA: Diagnosis not present

## 2023-05-29 DIAGNOSIS — K219 Gastro-esophageal reflux disease without esophagitis: Secondary | ICD-10-CM | POA: Diagnosis not present

## 2023-05-31 DIAGNOSIS — I4819 Other persistent atrial fibrillation: Secondary | ICD-10-CM | POA: Diagnosis not present

## 2023-05-31 DIAGNOSIS — E1169 Type 2 diabetes mellitus with other specified complication: Secondary | ICD-10-CM | POA: Diagnosis not present

## 2023-05-31 DIAGNOSIS — M1A9XX0 Chronic gout, unspecified, without tophus (tophi): Secondary | ICD-10-CM | POA: Diagnosis not present

## 2023-05-31 DIAGNOSIS — E039 Hypothyroidism, unspecified: Secondary | ICD-10-CM | POA: Diagnosis not present

## 2023-05-31 DIAGNOSIS — N184 Chronic kidney disease, stage 4 (severe): Secondary | ICD-10-CM | POA: Diagnosis not present

## 2023-05-31 DIAGNOSIS — J449 Chronic obstructive pulmonary disease, unspecified: Secondary | ICD-10-CM | POA: Diagnosis not present

## 2023-05-31 DIAGNOSIS — S82851D Displaced trimalleolar fracture of right lower leg, subsequent encounter for closed fracture with routine healing: Secondary | ICD-10-CM | POA: Diagnosis not present

## 2023-05-31 DIAGNOSIS — E1122 Type 2 diabetes mellitus with diabetic chronic kidney disease: Secondary | ICD-10-CM | POA: Diagnosis not present

## 2023-05-31 DIAGNOSIS — D631 Anemia in chronic kidney disease: Secondary | ICD-10-CM | POA: Diagnosis not present

## 2023-05-31 DIAGNOSIS — E785 Hyperlipidemia, unspecified: Secondary | ICD-10-CM | POA: Diagnosis not present

## 2023-05-31 DIAGNOSIS — E1151 Type 2 diabetes mellitus with diabetic peripheral angiopathy without gangrene: Secondary | ICD-10-CM | POA: Diagnosis not present

## 2023-05-31 DIAGNOSIS — K219 Gastro-esophageal reflux disease without esophagitis: Secondary | ICD-10-CM | POA: Diagnosis not present

## 2023-05-31 DIAGNOSIS — E1142 Type 2 diabetes mellitus with diabetic polyneuropathy: Secondary | ICD-10-CM | POA: Diagnosis not present

## 2023-05-31 DIAGNOSIS — E1165 Type 2 diabetes mellitus with hyperglycemia: Secondary | ICD-10-CM | POA: Diagnosis not present

## 2023-05-31 DIAGNOSIS — R29810 Facial weakness: Secondary | ICD-10-CM | POA: Diagnosis not present

## 2023-05-31 DIAGNOSIS — I1310 Hypertensive heart and chronic kidney disease without heart failure, with stage 1 through stage 4 chronic kidney disease, or unspecified chronic kidney disease: Secondary | ICD-10-CM | POA: Diagnosis not present

## 2023-05-31 DIAGNOSIS — N179 Acute kidney failure, unspecified: Secondary | ICD-10-CM | POA: Diagnosis not present

## 2023-05-31 DIAGNOSIS — I272 Pulmonary hypertension, unspecified: Secondary | ICD-10-CM | POA: Diagnosis not present

## 2023-05-31 DIAGNOSIS — Z794 Long term (current) use of insulin: Secondary | ICD-10-CM | POA: Diagnosis not present

## 2023-05-31 DIAGNOSIS — J9601 Acute respiratory failure with hypoxia: Secondary | ICD-10-CM | POA: Diagnosis not present

## 2023-05-31 DIAGNOSIS — L89312 Pressure ulcer of right buttock, stage 2: Secondary | ICD-10-CM | POA: Diagnosis not present

## 2023-05-31 DIAGNOSIS — L89322 Pressure ulcer of left buttock, stage 2: Secondary | ICD-10-CM | POA: Diagnosis not present

## 2023-06-02 DIAGNOSIS — E440 Moderate protein-calorie malnutrition: Secondary | ICD-10-CM | POA: Diagnosis not present

## 2023-06-02 DIAGNOSIS — L89159 Pressure ulcer of sacral region, unspecified stage: Secondary | ICD-10-CM | POA: Diagnosis not present

## 2023-06-02 DIAGNOSIS — D649 Anemia, unspecified: Secondary | ICD-10-CM | POA: Diagnosis not present

## 2023-06-02 DIAGNOSIS — E1149 Type 2 diabetes mellitus with other diabetic neurological complication: Secondary | ICD-10-CM | POA: Diagnosis not present

## 2023-06-02 DIAGNOSIS — N189 Chronic kidney disease, unspecified: Secondary | ICD-10-CM | POA: Diagnosis not present

## 2023-06-02 DIAGNOSIS — I5032 Chronic diastolic (congestive) heart failure: Secondary | ICD-10-CM | POA: Diagnosis not present

## 2023-06-02 DIAGNOSIS — I951 Orthostatic hypotension: Secondary | ICD-10-CM | POA: Diagnosis not present

## 2023-06-02 DIAGNOSIS — E559 Vitamin D deficiency, unspecified: Secondary | ICD-10-CM | POA: Diagnosis not present

## 2023-06-02 DIAGNOSIS — E039 Hypothyroidism, unspecified: Secondary | ICD-10-CM | POA: Diagnosis not present

## 2023-06-02 DIAGNOSIS — E1122 Type 2 diabetes mellitus with diabetic chronic kidney disease: Secondary | ICD-10-CM | POA: Diagnosis not present

## 2023-06-02 DIAGNOSIS — M109 Gout, unspecified: Secondary | ICD-10-CM | POA: Diagnosis not present

## 2023-06-03 DIAGNOSIS — E1169 Type 2 diabetes mellitus with other specified complication: Secondary | ICD-10-CM | POA: Diagnosis not present

## 2023-06-03 DIAGNOSIS — E1165 Type 2 diabetes mellitus with hyperglycemia: Secondary | ICD-10-CM | POA: Diagnosis not present

## 2023-06-03 DIAGNOSIS — J9601 Acute respiratory failure with hypoxia: Secondary | ICD-10-CM | POA: Diagnosis not present

## 2023-06-03 DIAGNOSIS — I4819 Other persistent atrial fibrillation: Secondary | ICD-10-CM | POA: Diagnosis not present

## 2023-06-03 DIAGNOSIS — L89322 Pressure ulcer of left buttock, stage 2: Secondary | ICD-10-CM | POA: Diagnosis not present

## 2023-06-03 DIAGNOSIS — I1310 Hypertensive heart and chronic kidney disease without heart failure, with stage 1 through stage 4 chronic kidney disease, or unspecified chronic kidney disease: Secondary | ICD-10-CM | POA: Diagnosis not present

## 2023-06-03 DIAGNOSIS — E1122 Type 2 diabetes mellitus with diabetic chronic kidney disease: Secondary | ICD-10-CM | POA: Diagnosis not present

## 2023-06-03 DIAGNOSIS — J449 Chronic obstructive pulmonary disease, unspecified: Secondary | ICD-10-CM | POA: Diagnosis not present

## 2023-06-03 DIAGNOSIS — N184 Chronic kidney disease, stage 4 (severe): Secondary | ICD-10-CM | POA: Diagnosis not present

## 2023-06-03 DIAGNOSIS — Z794 Long term (current) use of insulin: Secondary | ICD-10-CM | POA: Diagnosis not present

## 2023-06-03 DIAGNOSIS — K219 Gastro-esophageal reflux disease without esophagitis: Secondary | ICD-10-CM | POA: Diagnosis not present

## 2023-06-03 DIAGNOSIS — E785 Hyperlipidemia, unspecified: Secondary | ICD-10-CM | POA: Diagnosis not present

## 2023-06-03 DIAGNOSIS — S82851D Displaced trimalleolar fracture of right lower leg, subsequent encounter for closed fracture with routine healing: Secondary | ICD-10-CM | POA: Diagnosis not present

## 2023-06-03 DIAGNOSIS — E1142 Type 2 diabetes mellitus with diabetic polyneuropathy: Secondary | ICD-10-CM | POA: Diagnosis not present

## 2023-06-03 DIAGNOSIS — M1A9XX0 Chronic gout, unspecified, without tophus (tophi): Secondary | ICD-10-CM | POA: Diagnosis not present

## 2023-06-03 DIAGNOSIS — D631 Anemia in chronic kidney disease: Secondary | ICD-10-CM | POA: Diagnosis not present

## 2023-06-03 DIAGNOSIS — L89312 Pressure ulcer of right buttock, stage 2: Secondary | ICD-10-CM | POA: Diagnosis not present

## 2023-06-03 DIAGNOSIS — R29810 Facial weakness: Secondary | ICD-10-CM | POA: Diagnosis not present

## 2023-06-03 DIAGNOSIS — N179 Acute kidney failure, unspecified: Secondary | ICD-10-CM | POA: Diagnosis not present

## 2023-06-03 DIAGNOSIS — I272 Pulmonary hypertension, unspecified: Secondary | ICD-10-CM | POA: Diagnosis not present

## 2023-06-03 DIAGNOSIS — E1151 Type 2 diabetes mellitus with diabetic peripheral angiopathy without gangrene: Secondary | ICD-10-CM | POA: Diagnosis not present

## 2023-06-03 DIAGNOSIS — E039 Hypothyroidism, unspecified: Secondary | ICD-10-CM | POA: Diagnosis not present

## 2023-06-05 DIAGNOSIS — L89312 Pressure ulcer of right buttock, stage 2: Secondary | ICD-10-CM | POA: Diagnosis not present

## 2023-06-05 DIAGNOSIS — E785 Hyperlipidemia, unspecified: Secondary | ICD-10-CM | POA: Diagnosis not present

## 2023-06-05 DIAGNOSIS — E039 Hypothyroidism, unspecified: Secondary | ICD-10-CM | POA: Diagnosis not present

## 2023-06-05 DIAGNOSIS — M1A9XX0 Chronic gout, unspecified, without tophus (tophi): Secondary | ICD-10-CM | POA: Diagnosis not present

## 2023-06-05 DIAGNOSIS — R29810 Facial weakness: Secondary | ICD-10-CM | POA: Diagnosis not present

## 2023-06-05 DIAGNOSIS — N179 Acute kidney failure, unspecified: Secondary | ICD-10-CM | POA: Diagnosis not present

## 2023-06-05 DIAGNOSIS — L89322 Pressure ulcer of left buttock, stage 2: Secondary | ICD-10-CM | POA: Diagnosis not present

## 2023-06-05 DIAGNOSIS — K219 Gastro-esophageal reflux disease without esophagitis: Secondary | ICD-10-CM | POA: Diagnosis not present

## 2023-06-05 DIAGNOSIS — E1165 Type 2 diabetes mellitus with hyperglycemia: Secondary | ICD-10-CM | POA: Diagnosis not present

## 2023-06-05 DIAGNOSIS — I1310 Hypertensive heart and chronic kidney disease without heart failure, with stage 1 through stage 4 chronic kidney disease, or unspecified chronic kidney disease: Secondary | ICD-10-CM | POA: Diagnosis not present

## 2023-06-05 DIAGNOSIS — E1169 Type 2 diabetes mellitus with other specified complication: Secondary | ICD-10-CM | POA: Diagnosis not present

## 2023-06-05 DIAGNOSIS — N184 Chronic kidney disease, stage 4 (severe): Secondary | ICD-10-CM | POA: Diagnosis not present

## 2023-06-05 DIAGNOSIS — E1122 Type 2 diabetes mellitus with diabetic chronic kidney disease: Secondary | ICD-10-CM | POA: Diagnosis not present

## 2023-06-05 DIAGNOSIS — I272 Pulmonary hypertension, unspecified: Secondary | ICD-10-CM | POA: Diagnosis not present

## 2023-06-05 DIAGNOSIS — E1142 Type 2 diabetes mellitus with diabetic polyneuropathy: Secondary | ICD-10-CM | POA: Diagnosis not present

## 2023-06-05 DIAGNOSIS — E1151 Type 2 diabetes mellitus with diabetic peripheral angiopathy without gangrene: Secondary | ICD-10-CM | POA: Diagnosis not present

## 2023-06-05 DIAGNOSIS — J9601 Acute respiratory failure with hypoxia: Secondary | ICD-10-CM | POA: Diagnosis not present

## 2023-06-05 DIAGNOSIS — J449 Chronic obstructive pulmonary disease, unspecified: Secondary | ICD-10-CM | POA: Diagnosis not present

## 2023-06-05 DIAGNOSIS — I4819 Other persistent atrial fibrillation: Secondary | ICD-10-CM | POA: Diagnosis not present

## 2023-06-05 DIAGNOSIS — S82851D Displaced trimalleolar fracture of right lower leg, subsequent encounter for closed fracture with routine healing: Secondary | ICD-10-CM | POA: Diagnosis not present

## 2023-06-05 DIAGNOSIS — Z794 Long term (current) use of insulin: Secondary | ICD-10-CM | POA: Diagnosis not present

## 2023-06-05 DIAGNOSIS — D631 Anemia in chronic kidney disease: Secondary | ICD-10-CM | POA: Diagnosis not present

## 2023-06-07 DIAGNOSIS — L89312 Pressure ulcer of right buttock, stage 2: Secondary | ICD-10-CM | POA: Diagnosis not present

## 2023-06-07 DIAGNOSIS — E1151 Type 2 diabetes mellitus with diabetic peripheral angiopathy without gangrene: Secondary | ICD-10-CM | POA: Diagnosis not present

## 2023-06-07 DIAGNOSIS — M1A9XX0 Chronic gout, unspecified, without tophus (tophi): Secondary | ICD-10-CM | POA: Diagnosis not present

## 2023-06-07 DIAGNOSIS — R29810 Facial weakness: Secondary | ICD-10-CM | POA: Diagnosis not present

## 2023-06-07 DIAGNOSIS — J449 Chronic obstructive pulmonary disease, unspecified: Secondary | ICD-10-CM | POA: Diagnosis not present

## 2023-06-07 DIAGNOSIS — E1165 Type 2 diabetes mellitus with hyperglycemia: Secondary | ICD-10-CM | POA: Diagnosis not present

## 2023-06-07 DIAGNOSIS — E1142 Type 2 diabetes mellitus with diabetic polyneuropathy: Secondary | ICD-10-CM | POA: Diagnosis not present

## 2023-06-07 DIAGNOSIS — K219 Gastro-esophageal reflux disease without esophagitis: Secondary | ICD-10-CM | POA: Diagnosis not present

## 2023-06-07 DIAGNOSIS — D631 Anemia in chronic kidney disease: Secondary | ICD-10-CM | POA: Diagnosis not present

## 2023-06-07 DIAGNOSIS — E1169 Type 2 diabetes mellitus with other specified complication: Secondary | ICD-10-CM | POA: Diagnosis not present

## 2023-06-07 DIAGNOSIS — J9601 Acute respiratory failure with hypoxia: Secondary | ICD-10-CM | POA: Diagnosis not present

## 2023-06-07 DIAGNOSIS — Z794 Long term (current) use of insulin: Secondary | ICD-10-CM | POA: Diagnosis not present

## 2023-06-07 DIAGNOSIS — I272 Pulmonary hypertension, unspecified: Secondary | ICD-10-CM | POA: Diagnosis not present

## 2023-06-07 DIAGNOSIS — I4819 Other persistent atrial fibrillation: Secondary | ICD-10-CM | POA: Diagnosis not present

## 2023-06-07 DIAGNOSIS — E039 Hypothyroidism, unspecified: Secondary | ICD-10-CM | POA: Diagnosis not present

## 2023-06-07 DIAGNOSIS — E785 Hyperlipidemia, unspecified: Secondary | ICD-10-CM | POA: Diagnosis not present

## 2023-06-07 DIAGNOSIS — N179 Acute kidney failure, unspecified: Secondary | ICD-10-CM | POA: Diagnosis not present

## 2023-06-07 DIAGNOSIS — S82851D Displaced trimalleolar fracture of right lower leg, subsequent encounter for closed fracture with routine healing: Secondary | ICD-10-CM | POA: Diagnosis not present

## 2023-06-07 DIAGNOSIS — L89322 Pressure ulcer of left buttock, stage 2: Secondary | ICD-10-CM | POA: Diagnosis not present

## 2023-06-07 DIAGNOSIS — N184 Chronic kidney disease, stage 4 (severe): Secondary | ICD-10-CM | POA: Diagnosis not present

## 2023-06-07 DIAGNOSIS — E1122 Type 2 diabetes mellitus with diabetic chronic kidney disease: Secondary | ICD-10-CM | POA: Diagnosis not present

## 2023-06-07 DIAGNOSIS — I1310 Hypertensive heart and chronic kidney disease without heart failure, with stage 1 through stage 4 chronic kidney disease, or unspecified chronic kidney disease: Secondary | ICD-10-CM | POA: Diagnosis not present

## 2023-06-09 DIAGNOSIS — N179 Acute kidney failure, unspecified: Secondary | ICD-10-CM | POA: Diagnosis not present

## 2023-06-09 DIAGNOSIS — E1169 Type 2 diabetes mellitus with other specified complication: Secondary | ICD-10-CM | POA: Diagnosis not present

## 2023-06-09 DIAGNOSIS — M1A9XX0 Chronic gout, unspecified, without tophus (tophi): Secondary | ICD-10-CM | POA: Diagnosis not present

## 2023-06-09 DIAGNOSIS — L89322 Pressure ulcer of left buttock, stage 2: Secondary | ICD-10-CM | POA: Diagnosis not present

## 2023-06-09 DIAGNOSIS — J449 Chronic obstructive pulmonary disease, unspecified: Secondary | ICD-10-CM | POA: Diagnosis not present

## 2023-06-09 DIAGNOSIS — I272 Pulmonary hypertension, unspecified: Secondary | ICD-10-CM | POA: Diagnosis not present

## 2023-06-09 DIAGNOSIS — E039 Hypothyroidism, unspecified: Secondary | ICD-10-CM | POA: Diagnosis not present

## 2023-06-09 DIAGNOSIS — E1122 Type 2 diabetes mellitus with diabetic chronic kidney disease: Secondary | ICD-10-CM | POA: Diagnosis not present

## 2023-06-09 DIAGNOSIS — Z794 Long term (current) use of insulin: Secondary | ICD-10-CM | POA: Diagnosis not present

## 2023-06-09 DIAGNOSIS — E785 Hyperlipidemia, unspecified: Secondary | ICD-10-CM | POA: Diagnosis not present

## 2023-06-09 DIAGNOSIS — D631 Anemia in chronic kidney disease: Secondary | ICD-10-CM | POA: Diagnosis not present

## 2023-06-09 DIAGNOSIS — E1151 Type 2 diabetes mellitus with diabetic peripheral angiopathy without gangrene: Secondary | ICD-10-CM | POA: Diagnosis not present

## 2023-06-09 DIAGNOSIS — N184 Chronic kidney disease, stage 4 (severe): Secondary | ICD-10-CM | POA: Diagnosis not present

## 2023-06-09 DIAGNOSIS — R29810 Facial weakness: Secondary | ICD-10-CM | POA: Diagnosis not present

## 2023-06-09 DIAGNOSIS — E1165 Type 2 diabetes mellitus with hyperglycemia: Secondary | ICD-10-CM | POA: Diagnosis not present

## 2023-06-09 DIAGNOSIS — J9601 Acute respiratory failure with hypoxia: Secondary | ICD-10-CM | POA: Diagnosis not present

## 2023-06-09 DIAGNOSIS — L89312 Pressure ulcer of right buttock, stage 2: Secondary | ICD-10-CM | POA: Diagnosis not present

## 2023-06-09 DIAGNOSIS — I4819 Other persistent atrial fibrillation: Secondary | ICD-10-CM | POA: Diagnosis not present

## 2023-06-09 DIAGNOSIS — E1142 Type 2 diabetes mellitus with diabetic polyneuropathy: Secondary | ICD-10-CM | POA: Diagnosis not present

## 2023-06-09 DIAGNOSIS — S82851D Displaced trimalleolar fracture of right lower leg, subsequent encounter for closed fracture with routine healing: Secondary | ICD-10-CM | POA: Diagnosis not present

## 2023-06-09 DIAGNOSIS — K219 Gastro-esophageal reflux disease without esophagitis: Secondary | ICD-10-CM | POA: Diagnosis not present

## 2023-06-09 DIAGNOSIS — I1310 Hypertensive heart and chronic kidney disease without heart failure, with stage 1 through stage 4 chronic kidney disease, or unspecified chronic kidney disease: Secondary | ICD-10-CM | POA: Diagnosis not present

## 2023-06-10 DIAGNOSIS — E785 Hyperlipidemia, unspecified: Secondary | ICD-10-CM | POA: Diagnosis not present

## 2023-06-10 DIAGNOSIS — J449 Chronic obstructive pulmonary disease, unspecified: Secondary | ICD-10-CM | POA: Diagnosis not present

## 2023-06-10 DIAGNOSIS — L89322 Pressure ulcer of left buttock, stage 2: Secondary | ICD-10-CM | POA: Diagnosis not present

## 2023-06-10 DIAGNOSIS — E1165 Type 2 diabetes mellitus with hyperglycemia: Secondary | ICD-10-CM | POA: Diagnosis not present

## 2023-06-10 DIAGNOSIS — D631 Anemia in chronic kidney disease: Secondary | ICD-10-CM | POA: Diagnosis not present

## 2023-06-10 DIAGNOSIS — E1151 Type 2 diabetes mellitus with diabetic peripheral angiopathy without gangrene: Secondary | ICD-10-CM | POA: Diagnosis not present

## 2023-06-10 DIAGNOSIS — N184 Chronic kidney disease, stage 4 (severe): Secondary | ICD-10-CM | POA: Diagnosis not present

## 2023-06-10 DIAGNOSIS — I1310 Hypertensive heart and chronic kidney disease without heart failure, with stage 1 through stage 4 chronic kidney disease, or unspecified chronic kidney disease: Secondary | ICD-10-CM | POA: Diagnosis not present

## 2023-06-10 DIAGNOSIS — N179 Acute kidney failure, unspecified: Secondary | ICD-10-CM | POA: Diagnosis not present

## 2023-06-10 DIAGNOSIS — R29810 Facial weakness: Secondary | ICD-10-CM | POA: Diagnosis not present

## 2023-06-10 DIAGNOSIS — E039 Hypothyroidism, unspecified: Secondary | ICD-10-CM | POA: Diagnosis not present

## 2023-06-10 DIAGNOSIS — E1142 Type 2 diabetes mellitus with diabetic polyneuropathy: Secondary | ICD-10-CM | POA: Diagnosis not present

## 2023-06-10 DIAGNOSIS — L89312 Pressure ulcer of right buttock, stage 2: Secondary | ICD-10-CM | POA: Diagnosis not present

## 2023-06-10 DIAGNOSIS — I4819 Other persistent atrial fibrillation: Secondary | ICD-10-CM | POA: Diagnosis not present

## 2023-06-10 DIAGNOSIS — E1169 Type 2 diabetes mellitus with other specified complication: Secondary | ICD-10-CM | POA: Diagnosis not present

## 2023-06-10 DIAGNOSIS — S82851D Displaced trimalleolar fracture of right lower leg, subsequent encounter for closed fracture with routine healing: Secondary | ICD-10-CM | POA: Diagnosis not present

## 2023-06-10 DIAGNOSIS — K219 Gastro-esophageal reflux disease without esophagitis: Secondary | ICD-10-CM | POA: Diagnosis not present

## 2023-06-10 DIAGNOSIS — I272 Pulmonary hypertension, unspecified: Secondary | ICD-10-CM | POA: Diagnosis not present

## 2023-06-10 DIAGNOSIS — J9601 Acute respiratory failure with hypoxia: Secondary | ICD-10-CM | POA: Diagnosis not present

## 2023-06-10 DIAGNOSIS — M1A9XX0 Chronic gout, unspecified, without tophus (tophi): Secondary | ICD-10-CM | POA: Diagnosis not present

## 2023-06-10 DIAGNOSIS — Z794 Long term (current) use of insulin: Secondary | ICD-10-CM | POA: Diagnosis not present

## 2023-06-10 DIAGNOSIS — E1122 Type 2 diabetes mellitus with diabetic chronic kidney disease: Secondary | ICD-10-CM | POA: Diagnosis not present

## 2023-06-11 ENCOUNTER — Ambulatory Visit (HOSPITAL_COMMUNITY)
Admission: RE | Admit: 2023-06-11 | Discharge: 2023-06-11 | Disposition: A | Discharge status: Home / Self Care | Payer: Medicare Other | Source: Ambulatory Visit | Attending: Physician Assistant | Admitting: Physician Assistant

## 2023-06-12 ENCOUNTER — Telehealth: Payer: Self-pay | Admitting: Cardiovascular Disease

## 2023-06-12 DIAGNOSIS — E1142 Type 2 diabetes mellitus with diabetic polyneuropathy: Secondary | ICD-10-CM | POA: Diagnosis not present

## 2023-06-12 DIAGNOSIS — L89312 Pressure ulcer of right buttock, stage 2: Secondary | ICD-10-CM | POA: Diagnosis not present

## 2023-06-12 DIAGNOSIS — D631 Anemia in chronic kidney disease: Secondary | ICD-10-CM | POA: Diagnosis not present

## 2023-06-12 DIAGNOSIS — E1151 Type 2 diabetes mellitus with diabetic peripheral angiopathy without gangrene: Secondary | ICD-10-CM | POA: Diagnosis not present

## 2023-06-12 DIAGNOSIS — N179 Acute kidney failure, unspecified: Secondary | ICD-10-CM | POA: Diagnosis not present

## 2023-06-12 DIAGNOSIS — N184 Chronic kidney disease, stage 4 (severe): Secondary | ICD-10-CM | POA: Diagnosis not present

## 2023-06-12 DIAGNOSIS — E039 Hypothyroidism, unspecified: Secondary | ICD-10-CM | POA: Diagnosis not present

## 2023-06-12 DIAGNOSIS — R29810 Facial weakness: Secondary | ICD-10-CM | POA: Diagnosis not present

## 2023-06-12 DIAGNOSIS — E1122 Type 2 diabetes mellitus with diabetic chronic kidney disease: Secondary | ICD-10-CM | POA: Diagnosis not present

## 2023-06-12 DIAGNOSIS — J449 Chronic obstructive pulmonary disease, unspecified: Secondary | ICD-10-CM | POA: Diagnosis not present

## 2023-06-12 DIAGNOSIS — E1165 Type 2 diabetes mellitus with hyperglycemia: Secondary | ICD-10-CM | POA: Diagnosis not present

## 2023-06-12 DIAGNOSIS — K219 Gastro-esophageal reflux disease without esophagitis: Secondary | ICD-10-CM | POA: Diagnosis not present

## 2023-06-12 DIAGNOSIS — E1169 Type 2 diabetes mellitus with other specified complication: Secondary | ICD-10-CM | POA: Diagnosis not present

## 2023-06-12 DIAGNOSIS — Z794 Long term (current) use of insulin: Secondary | ICD-10-CM | POA: Diagnosis not present

## 2023-06-12 DIAGNOSIS — J9601 Acute respiratory failure with hypoxia: Secondary | ICD-10-CM | POA: Diagnosis not present

## 2023-06-12 DIAGNOSIS — I1310 Hypertensive heart and chronic kidney disease without heart failure, with stage 1 through stage 4 chronic kidney disease, or unspecified chronic kidney disease: Secondary | ICD-10-CM | POA: Diagnosis not present

## 2023-06-12 DIAGNOSIS — I4819 Other persistent atrial fibrillation: Secondary | ICD-10-CM | POA: Diagnosis not present

## 2023-06-12 DIAGNOSIS — M25571 Pain in right ankle and joints of right foot: Secondary | ICD-10-CM | POA: Diagnosis not present

## 2023-06-12 DIAGNOSIS — S82851D Displaced trimalleolar fracture of right lower leg, subsequent encounter for closed fracture with routine healing: Secondary | ICD-10-CM | POA: Diagnosis not present

## 2023-06-12 DIAGNOSIS — M1A9XX0 Chronic gout, unspecified, without tophus (tophi): Secondary | ICD-10-CM | POA: Diagnosis not present

## 2023-06-12 DIAGNOSIS — E785 Hyperlipidemia, unspecified: Secondary | ICD-10-CM | POA: Diagnosis not present

## 2023-06-12 DIAGNOSIS — L89322 Pressure ulcer of left buttock, stage 2: Secondary | ICD-10-CM | POA: Diagnosis not present

## 2023-06-12 DIAGNOSIS — I272 Pulmonary hypertension, unspecified: Secondary | ICD-10-CM | POA: Diagnosis not present

## 2023-06-12 NOTE — Telephone Encounter (Signed)
 Spoke with pt's husband (per DPR). Husband was actively helping the pt off of the toilet and the pt was having seizures due to his low blood pressure. Husband was advised to call 911, however he refused stating that the hospital does nothing for them and that they had been multiple times and no progress had been made. Husband stated Dr. Alvenia Aus is already aware of the pt's orthostatic hypotension and seizures. Husband stated he only called in to let someone know why the pt did not make it to his last appointment and to let someone know that he is unsure of when he can make another appointment. Husband was told that the information provided would be forwarded to Dr. Alvenia Aus and his nurse. Husband was advised again that he should call 911 for help. Again, he refused. Husband verbalized understanding. All questions if any were answered.

## 2023-06-12 NOTE — Telephone Encounter (Signed)
 Husband would like to discuss why pt is unable to come for appt

## 2023-06-13 NOTE — Telephone Encounter (Signed)
 Dr. Addie Holstein is his primary cardiologist.  I see him for PV only.

## 2023-06-13 NOTE — Telephone Encounter (Signed)
 Tough scenario.  He has had this issue with hypotension.  I am not sure if he is still on dialysis or not.  I have not seen him with the session in the hospital and he was quite sick at that time so I was not sure how he was midodrine  out in the outpatient setting.  It sounds like he probably needs something to help his blood pressures.  We would probably end up adding some weight midodrine  5 mg to the times a day as needed for low blood pressure.  Randene Bustard, MD

## 2023-06-16 DIAGNOSIS — Z794 Long term (current) use of insulin: Secondary | ICD-10-CM | POA: Diagnosis not present

## 2023-06-16 DIAGNOSIS — L89322 Pressure ulcer of left buttock, stage 2: Secondary | ICD-10-CM | POA: Diagnosis not present

## 2023-06-16 DIAGNOSIS — E1169 Type 2 diabetes mellitus with other specified complication: Secondary | ICD-10-CM | POA: Diagnosis not present

## 2023-06-16 DIAGNOSIS — E785 Hyperlipidemia, unspecified: Secondary | ICD-10-CM | POA: Diagnosis not present

## 2023-06-16 DIAGNOSIS — R29810 Facial weakness: Secondary | ICD-10-CM | POA: Diagnosis not present

## 2023-06-16 DIAGNOSIS — N179 Acute kidney failure, unspecified: Secondary | ICD-10-CM | POA: Diagnosis not present

## 2023-06-16 DIAGNOSIS — E039 Hypothyroidism, unspecified: Secondary | ICD-10-CM | POA: Diagnosis not present

## 2023-06-16 DIAGNOSIS — I1310 Hypertensive heart and chronic kidney disease without heart failure, with stage 1 through stage 4 chronic kidney disease, or unspecified chronic kidney disease: Secondary | ICD-10-CM | POA: Diagnosis not present

## 2023-06-16 DIAGNOSIS — I272 Pulmonary hypertension, unspecified: Secondary | ICD-10-CM | POA: Diagnosis not present

## 2023-06-16 DIAGNOSIS — L89312 Pressure ulcer of right buttock, stage 2: Secondary | ICD-10-CM | POA: Diagnosis not present

## 2023-06-16 DIAGNOSIS — K219 Gastro-esophageal reflux disease without esophagitis: Secondary | ICD-10-CM | POA: Diagnosis not present

## 2023-06-16 DIAGNOSIS — E1165 Type 2 diabetes mellitus with hyperglycemia: Secondary | ICD-10-CM | POA: Diagnosis not present

## 2023-06-16 DIAGNOSIS — E1142 Type 2 diabetes mellitus with diabetic polyneuropathy: Secondary | ICD-10-CM | POA: Diagnosis not present

## 2023-06-16 DIAGNOSIS — J9601 Acute respiratory failure with hypoxia: Secondary | ICD-10-CM | POA: Diagnosis not present

## 2023-06-16 DIAGNOSIS — S82851D Displaced trimalleolar fracture of right lower leg, subsequent encounter for closed fracture with routine healing: Secondary | ICD-10-CM | POA: Diagnosis not present

## 2023-06-16 DIAGNOSIS — M1A9XX0 Chronic gout, unspecified, without tophus (tophi): Secondary | ICD-10-CM | POA: Diagnosis not present

## 2023-06-16 DIAGNOSIS — I4819 Other persistent atrial fibrillation: Secondary | ICD-10-CM | POA: Diagnosis not present

## 2023-06-16 DIAGNOSIS — N184 Chronic kidney disease, stage 4 (severe): Secondary | ICD-10-CM | POA: Diagnosis not present

## 2023-06-16 DIAGNOSIS — D631 Anemia in chronic kidney disease: Secondary | ICD-10-CM | POA: Diagnosis not present

## 2023-06-16 DIAGNOSIS — J449 Chronic obstructive pulmonary disease, unspecified: Secondary | ICD-10-CM | POA: Diagnosis not present

## 2023-06-16 DIAGNOSIS — E1122 Type 2 diabetes mellitus with diabetic chronic kidney disease: Secondary | ICD-10-CM | POA: Diagnosis not present

## 2023-06-16 DIAGNOSIS — E1151 Type 2 diabetes mellitus with diabetic peripheral angiopathy without gangrene: Secondary | ICD-10-CM | POA: Diagnosis not present

## 2023-06-17 ENCOUNTER — Encounter: Payer: Self-pay | Admitting: Infectious Diseases

## 2023-06-17 ENCOUNTER — Telehealth: Payer: Self-pay

## 2023-06-17 ENCOUNTER — Other Ambulatory Visit: Payer: Self-pay

## 2023-06-17 ENCOUNTER — Telehealth (INDEPENDENT_AMBULATORY_CARE_PROVIDER_SITE_OTHER): Admitting: Infectious Diseases

## 2023-06-17 DIAGNOSIS — L089 Local infection of the skin and subcutaneous tissue, unspecified: Secondary | ICD-10-CM | POA: Diagnosis not present

## 2023-06-17 DIAGNOSIS — M1A9XX0 Chronic gout, unspecified, without tophus (tophi): Secondary | ICD-10-CM | POA: Diagnosis not present

## 2023-06-17 DIAGNOSIS — T849XXD Unspecified complication of internal orthopedic prosthetic device, implant and graft, subsequent encounter: Secondary | ICD-10-CM | POA: Diagnosis not present

## 2023-06-17 DIAGNOSIS — E1151 Type 2 diabetes mellitus with diabetic peripheral angiopathy without gangrene: Secondary | ICD-10-CM | POA: Diagnosis not present

## 2023-06-17 DIAGNOSIS — E039 Hypothyroidism, unspecified: Secondary | ICD-10-CM | POA: Diagnosis not present

## 2023-06-17 DIAGNOSIS — R7881 Bacteremia: Secondary | ICD-10-CM | POA: Diagnosis not present

## 2023-06-17 DIAGNOSIS — E1122 Type 2 diabetes mellitus with diabetic chronic kidney disease: Secondary | ICD-10-CM | POA: Diagnosis not present

## 2023-06-17 DIAGNOSIS — I878 Other specified disorders of veins: Secondary | ICD-10-CM

## 2023-06-17 DIAGNOSIS — S91001D Unspecified open wound, right ankle, subsequent encounter: Secondary | ICD-10-CM

## 2023-06-17 DIAGNOSIS — E559 Vitamin D deficiency, unspecified: Secondary | ICD-10-CM | POA: Diagnosis not present

## 2023-06-17 DIAGNOSIS — K219 Gastro-esophageal reflux disease without esophagitis: Secondary | ICD-10-CM | POA: Diagnosis not present

## 2023-06-17 DIAGNOSIS — E1165 Type 2 diabetes mellitus with hyperglycemia: Secondary | ICD-10-CM | POA: Diagnosis not present

## 2023-06-17 DIAGNOSIS — R29810 Facial weakness: Secondary | ICD-10-CM | POA: Diagnosis not present

## 2023-06-17 DIAGNOSIS — E876 Hypokalemia: Secondary | ICD-10-CM | POA: Diagnosis not present

## 2023-06-17 DIAGNOSIS — I951 Orthostatic hypotension: Secondary | ICD-10-CM | POA: Diagnosis not present

## 2023-06-17 DIAGNOSIS — B9689 Other specified bacterial agents as the cause of diseases classified elsewhere: Secondary | ICD-10-CM | POA: Diagnosis not present

## 2023-06-17 DIAGNOSIS — I272 Pulmonary hypertension, unspecified: Secondary | ICD-10-CM | POA: Diagnosis not present

## 2023-06-17 DIAGNOSIS — Z794 Long term (current) use of insulin: Secondary | ICD-10-CM | POA: Diagnosis not present

## 2023-06-17 DIAGNOSIS — Z79899 Other long term (current) drug therapy: Secondary | ICD-10-CM

## 2023-06-17 DIAGNOSIS — I739 Peripheral vascular disease, unspecified: Secondary | ICD-10-CM

## 2023-06-17 DIAGNOSIS — N184 Chronic kidney disease, stage 4 (severe): Secondary | ICD-10-CM | POA: Diagnosis not present

## 2023-06-17 DIAGNOSIS — N179 Acute kidney failure, unspecified: Secondary | ICD-10-CM | POA: Diagnosis not present

## 2023-06-17 DIAGNOSIS — J9601 Acute respiratory failure with hypoxia: Secondary | ICD-10-CM | POA: Diagnosis not present

## 2023-06-17 DIAGNOSIS — L89312 Pressure ulcer of right buttock, stage 2: Secondary | ICD-10-CM | POA: Diagnosis not present

## 2023-06-17 DIAGNOSIS — I4819 Other persistent atrial fibrillation: Secondary | ICD-10-CM | POA: Diagnosis not present

## 2023-06-17 DIAGNOSIS — L89322 Pressure ulcer of left buttock, stage 2: Secondary | ICD-10-CM | POA: Diagnosis not present

## 2023-06-17 DIAGNOSIS — E785 Hyperlipidemia, unspecified: Secondary | ICD-10-CM | POA: Diagnosis not present

## 2023-06-17 DIAGNOSIS — E1142 Type 2 diabetes mellitus with diabetic polyneuropathy: Secondary | ICD-10-CM | POA: Diagnosis not present

## 2023-06-17 DIAGNOSIS — I1310 Hypertensive heart and chronic kidney disease without heart failure, with stage 1 through stage 4 chronic kidney disease, or unspecified chronic kidney disease: Secondary | ICD-10-CM | POA: Diagnosis not present

## 2023-06-17 DIAGNOSIS — J449 Chronic obstructive pulmonary disease, unspecified: Secondary | ICD-10-CM | POA: Diagnosis not present

## 2023-06-17 DIAGNOSIS — R9431 Abnormal electrocardiogram [ECG] [EKG]: Secondary | ICD-10-CM

## 2023-06-17 DIAGNOSIS — S82851D Displaced trimalleolar fracture of right lower leg, subsequent encounter for closed fracture with routine healing: Secondary | ICD-10-CM | POA: Diagnosis not present

## 2023-06-17 DIAGNOSIS — E1169 Type 2 diabetes mellitus with other specified complication: Secondary | ICD-10-CM | POA: Diagnosis not present

## 2023-06-17 DIAGNOSIS — D631 Anemia in chronic kidney disease: Secondary | ICD-10-CM | POA: Diagnosis not present

## 2023-06-17 MED ORDER — CIPROFLOXACIN HCL 500 MG PO TABS: 500.0000 mg | ORAL_TABLET | Freq: Two times a day (BID) | ORAL | 0 refills | Status: DC

## 2023-06-17 NOTE — Progress Notes (Unsigned)
 Virtual Visit via Video Note  I connected withNAME@ on 06/18/23 at  3:45 PM EDT by a video enabled telemedicine application and verified that I am speaking with the correct person using two identifiers.  Location: Patient: Home Provider: [facility]   I discussed the limitations of evaluation and management by telemedicine and the availability of in person appointments. The patient expressed understanding and agreed to proceed.  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, right (HCC) 03/28/2023   Abscess of skin of right ankle 03/28/2023   PAD (peripheral artery disease) (HCC) 03/28/2023   Vitamin D  deficiency 01/07/2023   Prolonged QT interval 01/02/2023   Acute kidney injury superimposed on stage 4 chronic kidney disease (HCC) 01/02/2023   Normocytic anemia 01/02/2023   Paroxysmal atrial fibrillation (HCC) 01/02/2023   Uncontrolled type 2 diabetes mellitus with hyperglycemia, with long-term current use of insulin  (HCC) 01/02/2023   Acquired hypothyroidism 01/02/2023   CKD stage 3b, GFR 30-44 ml/min (HCC) 11/07/2022   CAD in native artery 11/07/2022   PVD (peripheral vascular disease) (HCC) 11/06/2022   Hypercoagulable state due to persistent atrial fibrillation (HCC) 02/19/2022   Persistent atrial fibrillation (HCC) 01/28/2022   Lumbar stenosis with neurogenic claudication 07/26/2020   Diabetic neuropathy (HCC) 08/24/2019   Trigger thumb of left hand 09/02/2018   Atherosclerosis of native artery of both lower extremities with intermittent claudication (HCC) 05/12/2018   Pain of left hand 03/26/2018   Trigger finger 03/03/2017   Pain in finger of left hand 03/03/2017    Snoring 08/20/2016   Morbid (severe) obesity due to excess calories (HCC) 04/18/2016   Venous stasis of both lower extremities - with edema 02/25/2015   Right-sided chest wall pain 05/08/2014   Gastroesophageal reflux disease without esophagitis 10/10/2013   Chronic heart failure with preserved ejection fraction (HFpEF) (HCC) 06/27/2013   COPD (chronic obstructive pulmonary disease) (HCC) 06/17/2013   Restrictive lung disease 06/17/2013   Obesity, Class III, BMI 40-49.9 (morbid obesity) (HCC) 09/01/2012   Generalized weakness 09/01/2012   Chronic renal disease, stage IV (HCC) 04/30/2012   OSA on CPAP 04/29/2012   Pulmonary hypertension, 04/29/2012   Dyspnea on exertion   04/26/2012   CAD, LAD/CFX DES 04/28/12- staged RCA DES 04/29/12 after pt declined CABG, cath 05/14/13 stable CAD medical therapy    Carotid artery stenosis without cerebral infarction, bilateral 11/07/2011   Diabetes mellitus type 2 with neurological manifestations (HCC) 04/29/2011   Essential hypertension 04/29/2011   Neuropathy 04/29/2011   Hyperlipidemia associated with type 2 diabetes mellitus (HCC) 04/29/2011   Anxiety and depression 04/29/2011    Patient's Medications  New Prescriptions   CIPROFLOXACIN  (CIPRO ) 500 MG TABLET    Take 1 tablet (500 mg total) by mouth 2 (two) times daily for 14 days.  Previous Medications   ACETAMINOPHEN  (TYLENOL ) 500 MG TABLET    Take 1 tablet (500 mg total) by mouth every 6 (six) hours as needed for mild pain (pain score 1-3).   ALLOPURINOL  (ZYLOPRIM ) 100 MG TABLET    Take 0.5 tablets (50 mg total) by mouth daily.   AMIODARONE  (PACERONE ) 200 MG TABLET    Take 1 tablet (200 mg total) by mouth 2 (two) times daily.  APIXABAN  (ELIQUIS ) 5 MG TABS TABLET    TAKE 1 TABLET BY MOUTH TWICE A DAY   ATORVASTATIN  (LIPITOR) 40 MG TABLET    Take 1 tablet (40 mg total) by mouth daily.   CLOPIDOGREL  (PLAVIX ) 75 MG TABLET    Take 75 mg by mouth every morning.   COLCHICINE  0.6 MG TABLET     Take 1 tablet (0.6 mg total) by mouth every other day.   DULOXETINE  (CYMBALTA ) 60 MG CAPSULE    Take 60 mg by mouth 2 (two) times daily.   EZETIMIBE  (ZETIA ) 10 MG TABLET    Take 10 mg by mouth daily.   FERROUS SULFATE  325 (65 FE) MG TABLET    Take 325 mg by mouth 3 (three) times a week.   FUROSEMIDE  (LASIX ) 80 MG TABLET    Take 0.5 tablets (40 mg total) by mouth daily. May take additional tablet for weight gain of 3 pounds in one day or five pounds in one week   GABAPENTIN  (NEURONTIN ) 300 MG CAPSULE    Take 1 capsule (300 mg total) by mouth at bedtime.   LEVOTHYROXINE  (SYNTHROID , LEVOTHROID) 88 MCG TABLET    Take 88 mcg by mouth daily before breakfast.   LORATADINE  (CLARITIN ) 10 MG TABLET    Take 1 tablet (10 mg total) by mouth daily.   MAGNESIUM  OXIDE (MAG-OX) 400 MG TABLET    Take 1 tablet by mouth daily.   METHOCARBAMOL  (ROBAXIN ) 500 MG TABLET    Take 1 tablet (500 mg total) by mouth every 6 (six) hours as needed for muscle spasms.   MULTIPLE VITAMIN (MULTIVITAMIN WITH MINERALS) TABS TABLET    Take 1 tablet by mouth daily.   NITROGLYCERIN  (NITROSTAT ) 0.4 MG SL TABLET    PLACE 1 TAB UNDER THE TONGUE EVERY 5 MINUTES AS NEEDED FOR CHEST PAIN (SEVERE PRESSURE OR TIGHTNESS)   OXYCODONE  (OXY IR/ROXICODONE ) 5 MG IMMEDIATE RELEASE TABLET    Take 1 tablet (5 mg total) by mouth every 4 (four) hours as needed for severe pain (pain score 7-10).   PANTOPRAZOLE  (PROTONIX ) 40 MG TABLET    TAKE 1 TABLET BY MOUTH EVERY DAY   SODIUM FLUORIDE 5000 PPM 1.1 % PSTE    Take 1 Application by mouth in the morning and at bedtime.   TAMSULOSIN  (FLOMAX ) 0.4 MG CAPS CAPSULE    Take 0.4 mg by mouth daily.   TIRZEPATIDE  (MOUNJARO ) 15 MG/0.5ML PEN    Inject 15 mg into the skin once a week.   TRESIBA  FLEXTOUCH 200 UNIT/ML FLEXTOUCH PEN    Inject 10 Units into the skin daily.   VITAMIN D , ERGOCALCIFEROL , (DRISDOL ) 1.25 MG (50000 UNIT) CAPS CAPSULE    Take 1 capsule (50,000 Units total) by mouth every 7 (seven) days.  Modified  Medications   No medications on file  Discontinued Medications   No medications on file    History of Present Illness:  68 Y O male with h/o CKD, qtc prolongation, morbid obesity, OSA, COPD, DM2 with neuropathy, CAD s/p CABG, HLD A fib on AC, Hypothyroidism, Pul HTN, CHF, GERD, Carotid artery stenosis, PVD, Venous stasis of lower extremities, prior Rt lower extremity ORIF associated infection s/p I and D on 03/31/23 and 04/02/23 with all hardware out ( cx MRSE and enterobacter claoacae)  placed on dapto/cefepime  but stopped on 2/28 due to AKI on CKD concerning for AIN requiring iHD discharged on 3/22 but re-admitted 3/24 after a ground level fall resulting in rt open ankle # through the area  of previous infection s/p initial ex fix, closed reduction, I and D on 3/24 followed by Removal of external fixator,ORIF on 3/27 with course complicated by enterobacter bacteremia who is here for follow up. Patient had HDC remove don 3/30, Repeat blood cx 3/31 NG. No need for HD on discharge. Patient was discharged to home with [facility] with plan to complete 4 weeks of ciprofloxscin through 4/28 with close qtc monitoring   4/29 Unable to come to clinic in person and hence, visit switched to video. Husband reports ciprofloxacin  was completed 3 days ago. Sutures out yesterday. 2 sores developed in rt ankle  that developed a week ago, recently draining clear stuff. No fevers, chills, nausea, vomiting. Unable to be seen at Dr Curtiss Dowdy office   and had portable xray done at home,  no results yet. He is unable to walk and even difficult to transfer from bed to commode. BP goes down when standing as well as seizures which they were able to control but does not want to go to ED. Seen in the ED 4/7 for SOB while doing PT with unremarkable work up. Labs were drawn 2 hrs ago.  ROS all systems reviewed with pertinent positives and negatives as listed above  Past Medical History:  Diagnosis Date   Anemia    Anginal pain (HCC) 04/2013    Cardiac cath showed patent stents with distal LAD, circumflex-OM and RCA disease in small vessels.   Anxiety    Atrial fibrillation (HCC) 12/2021   CAD S/P percutaneous coronary angioplasty 2003, 04/2012   status post PCI to LAD, circumflex-OM 2, RCA   Carotid artery occlusion    Chronic renal insufficiency, stage II (mild)    Chronic venous insufficiency    varicosities, no reflux; dopplers 04/14/12- valvular insufficiency in the R and L GSV   Complication of anesthesia    COPD, mild (HCC)    Depression    situaltional    Diabetes mellitus    Diabetic neuropathy (HCC) 08/24/2019   Dyslipidemia associated with type 2 diabetes mellitus (HCC)    Fall at home, initial encounter 01/02/2023   GERD (gastroesophageal reflux disease)    History of cardioversion 12/2021   at [facility]   HTN (hypertension)    Hypothyroidism    Neuromuscular disorder (HCC)    neuropathy in feet   Neuropathy    notably improved following PCI with improved cardiac function   Obesity    Obesity, Class II, BMI 35.0-39.9, with comorbidity (see actual BMI)    BMI 39; wgt loss efforts in place; seeing Dietician   Open ankle fracture 01/02/2023   Orthostatic hypotension    PONV (postoperative nausea and vomiting)    "[employer]"   Pulmonary hypertension (HCC) 04/2012   PA pressure   Sleep apnea    uses nightly   Vitamin D  deficiency    per facility notes   Past Surgical History:  Procedure Laterality Date   ABDOMINAL AORTAGRAM N/A 11/15/2011   Procedure: ABDOMINAL Tommi Fraise;  Surgeon: Richrd Char, MD;  Location: [facility] CATH LAB;  Service: Cardiovascular;  [name];   ABDOMINAL AORTOGRAM W/LOWER EXTREMITY N/A 10/13/2019   Procedure: ABDOMINAL AORTOGRAM W/LOWER EXTREMITY;  Surgeon: Wenona Hamilton, MD;  Location: [facility] INVASIVE CV LAB;  Service: CV: Non-obst Ao-Iliac. R SFA-PopA patent with significant calcific stenosis of TP trunk-prox PTA (dominant) & CTO ATA  -> R PTA-TP PTCA   ABDOMINAL  AORTOGRAM W/LOWER EXTREMITY N/A 11/06/2022   Procedure: ABDOMINAL AORTOGRAM W/LOWER EXTREMITY;  Surgeon: Wenona Hamilton, MD;  Location: [facility] INVASIVE CV LAB;  Service: Cardiovascular;  [name];   ABDOMINAL AORTOGRAM W/LOWER EXTREMITY Right 04/01/2023   Procedure: ABDOMINAL AORTOGRAM W/LOWER EXTREMITY;  Surgeon: Margherita Shell, MD;  Location: [facility] INVASIVE CV LAB;  Service: Cardiovascular;  Laterality: Right;   ABIs  04/27/2012   mild bilateral arterial insufficiency   ANKLE FUSION Right 05/15/2023   Procedure: ARTHRODESIS ANKLE WITH HINDFOOT FUSION NAIL;  Surgeon: Laneta Pintos, MD;  [name];  Service: Orthopedics;  Laterality: Right;   APPLICATION OF WOUND VAC Right 03/31/2023   Procedure: APPLICATION OF WOUND VAC;  Surgeon: Laneta Pintos, MD;  [name];  Service: Orthopedics;  Laterality: Right;   BACK SURGERY  2005 x1   X2-2010   BUNIONECTOMY Right 11/11/2013   Procedure: RIGHT FOOT SILVER BUNIONECTOMY;  Surgeon: Amada Backer, MD;  Location: [facility];  Service: Orthopedics;  Laterality: Right;   CARDIAC CATHETERIZATION  12/11/2001   significant 3V CAD, normal LV function   CARDIOVERSION N/A 03/13/2022   Procedure: CARDIOVERSION;  Surgeon: Wendie Hamburg, MD;  Location: [facility] ENDOSCOPY;  Service: Cardiovascular;  [name];   CARDIOVERSION N/A 07/25/2022   Procedure: CARDIOVERSION;  Surgeon: Hugh Madura, MD;  [facility] INVASIVE CV LAB;  Service: Cardiovascular;  [name];   Carotid Doppler  04/27/2012   right internal carotid: Elevated velocities but no evidence of plaque. Left internal carotid 40-59%   CAROTID ENDARTERECTOMY  2005   Right; recent carotid Dopplers notes elevated velocities.    colonscopy     CORONARY ANGIOPLASTY  12/21/2001   PTCA of the distal and mid AV groove circ, unsuccessful PTCA of second OM total occlusion, unsuccessful PTCA of the apical LAD total occlusion   CORONARY ANGIOPLASTY WITH STENT PLACEMENT   04/29/2012   PCI to 3 RCA lesions, Promus Premiere 2.30mmx8mm distally, mid was 2.69mx28mm and proximally 2.75x8mm, EF 55-60%   CORONARY STENT PLACEMENT  04/28/2012   PCI to LAD (3x15mm Xience DES postdilated to 3.25) and circ prox and mid (2 overlappinmg 2.68mmx12mmXience DES postdilated to 2.38mm)   DOPPLER ECHOCARDIOGRAPHY  04/28/2012   poor quality study: EF estimated 60-65%; unable to assess diastolic function (previously noted to have diastolic dysfunction); severely dilated left atrium and mild right atrium; dilated IVC consistent with increased central venous pressure.Aaron Aas   EXTERNAL FIXATION LEG Right 05/12/2023   Procedure: EXTERNAL FIXATION, LOWER EXTREMITY;  Surgeon: Laneta Pintos, MD;  [name];  Service: Orthopedics;  Laterality: Right;   HARDWARE REMOVAL Right 03/31/2023   Procedure: HARDWARE REMOVAL RIGHT ANKLE;  Surgeon: Laneta Pintos, MD;  [name];  Service: Orthopedics;  Laterality: Right;   HARDWARE REMOVAL Right 01/28/2023   Procedure: REMOVAL OF HARDWARE RIGHT ANKLE;  Surgeon: Hardy Lia, MD;  [name];  Service: Orthopedics;  Laterality: Right;   HERNIA REPAIR     I & D EXTREMITY Right 01/02/2023   Procedure: IRRIGATION AND DEBRIDEMENT RIGHT ANKLE;  Surgeon: Hardy Lia, MD;  [name];  Service: Orthopedics;  Laterality: Right;   I & D EXTREMITY Right 04/02/2023   Procedure: DEBRIDEMENT RIGHT ANKLE;  Surgeon: Timothy Ford, MD;  Location: [facility] OR;  Service: Orthopedics;  Laterality: Right;   IR FLUORO GUIDE CV LINE LEFT  04/22/2023   IR FLUORO GUIDE CV LINE RIGHT  04/07/2023   IR PATIENT EVAL TECH 0-60 MINS  04/07/2023   IR RADIOLOGIST EVAL & MGMT  04/08/2023   IR  REMOVAL TUN CV CATH W/O FL  05/05/2023   IR US  GUIDE VASC ACCESS LEFT  04/22/2023   IR US  GUIDE VASC ACCESS RIGHT  04/07/2023   LEFT AND RIGHT HEART CATHETERIZATION WITH CORONARY ANGIOGRAM N/A 04/27/2012   Procedure: LEFT AND RIGHT HEART CATHETERIZATION WITH CORONARY ANGIOGRAM;   Surgeon: Arleen Lacer, MD;  Location: [facility] CATH LAB;  Service: Cardiovascular;  [name];   LEFT AND RIGHT HEART CATHETERIZATION WITH CORONARY ANGIOGRAM N/A 05/14/2013   Procedure: LEFT AND RIGHT HEART CATHETERIZATION WITH CORONARY ANGIOGRAM;  Surgeon: Millicent Ally, MD;  [name]; PCWP ;; [name], [name], Patent RCA ostial mid and distal stents - 70% distal runoff Dz   LUMBAR LAMINECTOMY/DECOMPRESSION MICRODISCECTOMY  01/07/2012   Procedure: LUMBAR LAMINECTOMY/DECOMPRESSION MICRODISCECTOMY 1 LEVEL;  Surgeon: Pasty Bongo, MD;  [name];  Service: Neurosurgery;  Laterality: Bilateral;   Lumbar Three-Four Decompression   LUMBAR LAMINECTOMY/DECOMPRESSION MICRODISCECTOMY N/A 07/26/2020   Procedure: Lumbar two-three Laminectomy/Foraminotomy;  Surgeon: Audie Bleacher, MD;  [name];  Service: Neurosurgery;  [name];   ORIF ANKLE FRACTURE Right 01/02/2023   Procedure: OPEN REDUCTION INTERNAL FIXATION (ORIF)RIGHT ANKLE FRACTURE;  Surgeon: Hardy Lia, MD;  [name];  Service: Orthopedics;  Laterality: Right;   PERCUTANEOUS CORONARY STENT INTERVENTION (PCI-S) N/A 04/28/2012   Procedure: PERCUTANEOUS CORONARY STENT INTERVENTION (PCI-S);  Surgeon: Millicent Ally, MD;  Location: [facility] CATH LAB;  Service: Cardiovascular;  [name];   PERCUTANEOUS CORONARY STENT INTERVENTION (PCI-S) N/A 04/29/2012   Procedure: PERCUTANEOUS CORONARY STENT INTERVENTION (PCI-S);  Surgeon: Arleen Lacer, MD;  Location: [facility] CATH LAB;  Service: Cardiovascular;  [name];   PERIPHERAL VASCULAR ATHERECTOMY  11/06/2022   Procedure: PERIPHERAL VASCULAR ATHERECTOMY;  Surgeon: Wenona Hamilton, MD;  [facility]AS[facility] CV LAB;  Service: Cardiovascular;;   PERIPHERAL VASCULAR BALLOON ANGIOPLASTY Right 10/13/2019   Procedure:  PERIPHERAL VASCULAR BALLOON ANGIOPLASTY;  Surgeon: Wenona Hamilton, MD;  [facility]AS[facility] CV LAB;  Service: Cardiovascular;  Laterality: Right;  PTCA of R TP Trunk into PTA (Drug-coated) => Follow-up ABIs 0.9 on the right and 0.99 on the left.   PERIPHERAL VASCULAR BALLOON ANGIOPLASTY Right 04/01/2023   Procedure: PERIPHERAL VASCULAR BALLOON ANGIOPLASTY;  Surgeon: Margherita Shell, MD;  [facility] INVAS[facility] CV LAB;  Service: Cardiovascular;  Laterality: Right;   PERIPHERAL VASCULAR INTERVENTION Right 04/01/2023   Procedure: PERIPHERAL VASCULAR INTERVENTION;  Surgeon: Margherita Shell, MD;  [facility] INVASIVE CV LAB;  Service: Cardiovascular;  Laterality: Right;   SPINE SURGERY     UMBILICAL HERNIA REPAIR  2009   steel mesh insert    Social History   Tobacco Use   Smoking status: Former    Current packs/day: 0.00    Average packs/day: 1 pack/day for 42.0 years (42.0 ttl pk-yrs)    Types: Cigarettes    Start date: 03/22/1961    Quit date: 03/23/2003    Years since quitting: 20.2   Smokeless tobacco: Never   Tobacco comments:    Former smoker 02/19/22  Vaping Use   Vaping status: Never Used  Substance Use Topics   Alcohol use: No    Alcohol/week: 0.0 standard drinks of alcohol   Drug use: No    Family History  Adopted: Yes  Family history unknown: Yes    Allergies  Allergen Reactions  Doxycycline  Hives and Rash   Septra [Bactrim] Itching   Penicillins Rash    Allergy to All cillin drugs  Ancef  given 9/24 with no obvious reaction   Metformin  And Related Diarrhea and Nausea Only   Other Nausea And Vomiting    General Anesthesia - sometimes causes nausea and vomiting * OK to use scopolamine  patch     Sulfamethoxazole-Trimethoprim Other (See Comments)   Wellbutrin [Bupropion]     Mood Changes    Health Maintenance  Topic Date Due   COVID-19 Vaccine (1) Never done   Zoster Vaccines- Shingrix (1 of 2) 09/01/1972   Diabetic kidney evaluation - Urine ACR  05/27/2012    OPHTHALMOLOGY EXAM  03/19/2015   FOOT EXAM  08/30/2015   DTaP/Tdap/Td (2 - Td or Tdap) 01/02/2023   HEMOGLOBIN A1C  07/02/2023   INFLUENZA VACCINE  09/19/2023   Medicare Annual Wellness (AWV)  10/02/2023   Diabetic kidney evaluation - eGFR measurement  05/25/2024   Colonoscopy  05/19/2029   Pneumonia Vaccine 46+ Years old  Completed   Hepatitis C Screening  Completed   HPV VACCINES  Aged Out   Meningococcal B Vaccine  Aged Out    Observations/Objective:   Assessment and Plan: # Complicated RT ankle wound associated with hardware  # Enterobacter cloacae bacteremia: source thought to be HDC vs rt ankle wound   Plan - re-start ciprofloxacin  500mg  po bid for 2 more weeks to complete 6 weeks course and stop thereafter due to newly placed hardware at the time of bacteremia - Labs requested. Qtc 4/8 is 436  - Discussed to send pictures via mychart  - Continue wound care  - Fu in 2 weeks in person - Fu with Orthopedics  # PVD/venous stasis of lower extremities  - Fu with Vascular   Follow Up Instructions: 2 weeks  I discussed the assessment and treatment plan with the patient. The patient was provided an opportunity to ask questions and all were answered. The patient agreed with the plan and demonstrated an understanding of the instructions.   The patient was advised to call back or seek an in-person evaluation if the symptoms worsen or if the condition fails to improve as anticipated.  I provided 30 minutes of non-face-to-face time during this encounter.  Of note, portions of this note may have been created with voice recognition software. While this note has been edited for accuracy, occasional wrong-word or 'sound-a-like' substitutions may have occurred due to the inherent limitations of voice recognition software.   Melvina Stage, MD [facility] [phone] pager   [phone] cell 06/18/2023, 11:37 AM

## 2023-06-17 NOTE — Telephone Encounter (Signed)
 Patient had labs done with [facility] today. Dr. Gillian Lacrosse requesting that results be faxed to [facility] once back.   Called and left message for clinical staff at [facility] requesting that labs be faxed to triage once resulted.   [facility]: [phone]  Arlon Bergamo, BSN, RN

## 2023-06-18 ENCOUNTER — Encounter: Payer: Self-pay | Admitting: Cardiology

## 2023-06-18 DIAGNOSIS — S82891D Other fracture of right lower leg, subsequent encounter for closed fracture with routine healing: Secondary | ICD-10-CM | POA: Diagnosis not present

## 2023-06-18 DIAGNOSIS — L89322 Pressure ulcer of left buttock, stage 2: Secondary | ICD-10-CM | POA: Diagnosis not present

## 2023-06-18 DIAGNOSIS — N184 Chronic kidney disease, stage 4 (severe): Secondary | ICD-10-CM | POA: Diagnosis not present

## 2023-06-18 DIAGNOSIS — S82851E Displaced trimalleolar fracture of right lower leg, subsequent encounter for open fracture type I or II with routine healing: Secondary | ICD-10-CM | POA: Diagnosis not present

## 2023-06-18 DIAGNOSIS — M1A9XX0 Chronic gout, unspecified, without tophus (tophi): Secondary | ICD-10-CM | POA: Diagnosis not present

## 2023-06-18 DIAGNOSIS — J9601 Acute respiratory failure with hypoxia: Secondary | ICD-10-CM | POA: Diagnosis not present

## 2023-06-18 DIAGNOSIS — I2510 Atherosclerotic heart disease of native coronary artery without angina pectoris: Secondary | ICD-10-CM | POA: Diagnosis not present

## 2023-06-18 DIAGNOSIS — I130 Hypertensive heart and chronic kidney disease with heart failure and stage 1 through stage 4 chronic kidney disease, or unspecified chronic kidney disease: Secondary | ICD-10-CM | POA: Diagnosis not present

## 2023-06-18 DIAGNOSIS — E1122 Type 2 diabetes mellitus with diabetic chronic kidney disease: Secondary | ICD-10-CM | POA: Diagnosis not present

## 2023-06-18 DIAGNOSIS — E039 Hypothyroidism, unspecified: Secondary | ICD-10-CM | POA: Diagnosis not present

## 2023-06-18 DIAGNOSIS — S82851D Displaced trimalleolar fracture of right lower leg, subsequent encounter for closed fracture with routine healing: Secondary | ICD-10-CM | POA: Diagnosis not present

## 2023-06-18 DIAGNOSIS — K219 Gastro-esophageal reflux disease without esophagitis: Secondary | ICD-10-CM | POA: Diagnosis not present

## 2023-06-18 DIAGNOSIS — E1151 Type 2 diabetes mellitus with diabetic peripheral angiopathy without gangrene: Secondary | ICD-10-CM | POA: Diagnosis not present

## 2023-06-18 DIAGNOSIS — I4819 Other persistent atrial fibrillation: Secondary | ICD-10-CM | POA: Diagnosis not present

## 2023-06-18 DIAGNOSIS — N189 Chronic kidney disease, unspecified: Secondary | ICD-10-CM | POA: Diagnosis not present

## 2023-06-18 DIAGNOSIS — L89312 Pressure ulcer of right buttock, stage 2: Secondary | ICD-10-CM | POA: Diagnosis not present

## 2023-06-18 DIAGNOSIS — D631 Anemia in chronic kidney disease: Secondary | ICD-10-CM | POA: Diagnosis not present

## 2023-06-18 DIAGNOSIS — N179 Acute kidney failure, unspecified: Secondary | ICD-10-CM | POA: Diagnosis not present

## 2023-06-18 DIAGNOSIS — J449 Chronic obstructive pulmonary disease, unspecified: Secondary | ICD-10-CM | POA: Diagnosis not present

## 2023-06-18 DIAGNOSIS — E1165 Type 2 diabetes mellitus with hyperglycemia: Secondary | ICD-10-CM | POA: Diagnosis not present

## 2023-06-18 DIAGNOSIS — E785 Hyperlipidemia, unspecified: Secondary | ICD-10-CM | POA: Diagnosis not present

## 2023-06-18 DIAGNOSIS — Z79899 Other long term (current) drug therapy: Secondary | ICD-10-CM | POA: Insufficient documentation

## 2023-06-18 DIAGNOSIS — S91001A Unspecified open wound, right ankle, initial encounter: Secondary | ICD-10-CM | POA: Insufficient documentation

## 2023-06-18 DIAGNOSIS — I1310 Hypertensive heart and chronic kidney disease without heart failure, with stage 1 through stage 4 chronic kidney disease, or unspecified chronic kidney disease: Secondary | ICD-10-CM | POA: Diagnosis not present

## 2023-06-18 DIAGNOSIS — I272 Pulmonary hypertension, unspecified: Secondary | ICD-10-CM | POA: Diagnosis not present

## 2023-06-18 DIAGNOSIS — I4821 Permanent atrial fibrillation: Secondary | ICD-10-CM | POA: Diagnosis not present

## 2023-06-18 DIAGNOSIS — E1140 Type 2 diabetes mellitus with diabetic neuropathy, unspecified: Secondary | ICD-10-CM | POA: Diagnosis not present

## 2023-06-18 DIAGNOSIS — Z794 Long term (current) use of insulin: Secondary | ICD-10-CM | POA: Diagnosis not present

## 2023-06-18 DIAGNOSIS — E1142 Type 2 diabetes mellitus with diabetic polyneuropathy: Secondary | ICD-10-CM | POA: Diagnosis not present

## 2023-06-18 DIAGNOSIS — I5032 Chronic diastolic (congestive) heart failure: Secondary | ICD-10-CM | POA: Diagnosis not present

## 2023-06-18 DIAGNOSIS — R29810 Facial weakness: Secondary | ICD-10-CM | POA: Diagnosis not present

## 2023-06-18 DIAGNOSIS — E1169 Type 2 diabetes mellitus with other specified complication: Secondary | ICD-10-CM | POA: Diagnosis not present

## 2023-06-18 NOTE — Telephone Encounter (Signed)
 Yes to the midodrine .

## 2023-06-18 NOTE — Telephone Encounter (Signed)
 I discussed this in the patient call section.  Recommendation was to take 10 mg midodrine  in the evening for going to bed an additional 5 mg in the morning.  Okay to take additional 5 mg in the afternoon if pressures are still low.

## 2023-06-18 NOTE — Telephone Encounter (Signed)
 Patient identification verified by 2 forms. Harold Lucky, RN    Called and spoke to patients spouse Viann Graces provider message below  [name] states:   -midodrine  was restarted by him due to patient having low BP   -she currently takes Midodrine  5 mg twice daily   -provider at [facility] may start prescribing (new home GP)  -patient has drastic changes in BP when changing position   -has to check BP frequently before getting patient to stand up due to safety issue   -when patients BP drops he has seizures   -patient has a lot of health issue  -unable to bring patient in for OV due to BP issues   -BP readings from an episode on 4/26 when patient got up to use the bathroom:  -4/26 at 3:55AM 140/70 (sitting)  -4/26 at 4:00AM 100/44 (standing) -4/26 at 4:06am 104/52 (sitting on commode)  4:22AM 90/46 (on commode)  4:27 86/43 (on commode)  4:31 71/48 (on commode)  4:32 94/44 (on commode)  4:33 104/71 (on commode)  4:35 103/51 (on commode)  4:40 119/40 (on commode)  4:44 108/71 (on commode)  4:47 84/45 (on commode)  4:49 98/64 (on commode)  4:57 95/44 (on commode)  5:00 86/52 (on commode)  5:01: 114/52 (on commode)  5:04: 106/80 (on commode)  5:05 78/48 (on commode)  5:13 92/47 (on commode)  5:17 92/46 (on commode)  5:23 123/52 (was able to have patient stand up)  5:36 125/69 (patient is back in recliner)   Informed [name] message sent to Dr. Addie Holstein for input    Length of time on call: 32 minutes

## 2023-06-18 NOTE — Telephone Encounter (Signed)
 Based on the fact it appears that most of the hypotension episodes occur in the morning, I would recommend that he takes 10 mg midodrine  in the evening before bed, 5 mg in the morning upon awakening and may take an additional 5 mg in the afternoon if pressures are low in the afternoon/evening.  Randene Bustard, MD

## 2023-06-19 ENCOUNTER — Encounter (HOSPITAL_COMMUNITY): Payer: Self-pay

## 2023-06-19 ENCOUNTER — Other Ambulatory Visit: Payer: Self-pay

## 2023-06-19 ENCOUNTER — Emergency Department (HOSPITAL_COMMUNITY)

## 2023-06-19 ENCOUNTER — Inpatient Hospital Stay (HOSPITAL_COMMUNITY)
Admission: EM | Admit: 2023-06-19 | Discharge: 2023-06-28 | DRG: 496 | Disposition: A | Discharge status: Skilled Nursing Facility | Attending: Internal Medicine | Admitting: Internal Medicine

## 2023-06-19 DIAGNOSIS — E66813 Obesity, class 3: Secondary | ICD-10-CM | POA: Diagnosis present

## 2023-06-19 DIAGNOSIS — E039 Hypothyroidism, unspecified: Secondary | ICD-10-CM | POA: Diagnosis not present

## 2023-06-19 DIAGNOSIS — J9601 Acute respiratory failure with hypoxia: Secondary | ICD-10-CM | POA: Diagnosis not present

## 2023-06-19 DIAGNOSIS — T8469XA Infection and inflammatory reaction due to internal fixation device of other site, initial encounter: Principal | ICD-10-CM | POA: Diagnosis present

## 2023-06-19 DIAGNOSIS — Y831 Surgical operation with implant of artificial internal device as the cause of abnormal reaction of the patient, or of later complication, without mention of misadventure at the time of the procedure: Secondary | ICD-10-CM | POA: Diagnosis present

## 2023-06-19 DIAGNOSIS — K219 Gastro-esophageal reflux disease without esophagitis: Secondary | ICD-10-CM | POA: Diagnosis present

## 2023-06-19 DIAGNOSIS — N179 Acute kidney failure, unspecified: Secondary | ICD-10-CM | POA: Diagnosis present

## 2023-06-19 DIAGNOSIS — E1142 Type 2 diabetes mellitus with diabetic polyneuropathy: Secondary | ICD-10-CM | POA: Diagnosis not present

## 2023-06-19 DIAGNOSIS — D62 Acute posthemorrhagic anemia: Secondary | ICD-10-CM | POA: Diagnosis not present

## 2023-06-19 DIAGNOSIS — I272 Pulmonary hypertension, unspecified: Secondary | ICD-10-CM | POA: Diagnosis not present

## 2023-06-19 DIAGNOSIS — M48062 Spinal stenosis, lumbar region with neurogenic claudication: Secondary | ICD-10-CM | POA: Diagnosis not present

## 2023-06-19 DIAGNOSIS — E1165 Type 2 diabetes mellitus with hyperglycemia: Secondary | ICD-10-CM | POA: Diagnosis not present

## 2023-06-19 DIAGNOSIS — E871 Hypo-osmolality and hyponatremia: Secondary | ICD-10-CM | POA: Diagnosis present

## 2023-06-19 DIAGNOSIS — E785 Hyperlipidemia, unspecified: Secondary | ICD-10-CM | POA: Diagnosis present

## 2023-06-19 DIAGNOSIS — I951 Orthostatic hypotension: Secondary | ICD-10-CM | POA: Diagnosis present

## 2023-06-19 DIAGNOSIS — Z743 Need for continuous supervision: Secondary | ICD-10-CM | POA: Diagnosis not present

## 2023-06-19 DIAGNOSIS — L0889 Other specified local infections of the skin and subcutaneous tissue: Secondary | ICD-10-CM | POA: Diagnosis not present

## 2023-06-19 DIAGNOSIS — M86171 Other acute osteomyelitis, right ankle and foot: Secondary | ICD-10-CM | POA: Diagnosis not present

## 2023-06-19 DIAGNOSIS — Z8619 Personal history of other infectious and parasitic diseases: Secondary | ICD-10-CM | POA: Diagnosis not present

## 2023-06-19 DIAGNOSIS — Z7985 Long-term (current) use of injectable non-insulin antidiabetic drugs: Secondary | ICD-10-CM

## 2023-06-19 DIAGNOSIS — T84223A Displacement of internal fixation device of bones of foot and toes, initial encounter: Secondary | ICD-10-CM | POA: Diagnosis present

## 2023-06-19 DIAGNOSIS — I739 Peripheral vascular disease, unspecified: Secondary | ICD-10-CM | POA: Diagnosis present

## 2023-06-19 DIAGNOSIS — M19071 Primary osteoarthritis, right ankle and foot: Secondary | ICD-10-CM | POA: Diagnosis not present

## 2023-06-19 DIAGNOSIS — G4733 Obstructive sleep apnea (adult) (pediatric): Secondary | ICD-10-CM | POA: Diagnosis present

## 2023-06-19 DIAGNOSIS — E1140 Type 2 diabetes mellitus with diabetic neuropathy, unspecified: Secondary | ICD-10-CM | POA: Diagnosis not present

## 2023-06-19 DIAGNOSIS — L89150 Pressure ulcer of sacral region, unstageable: Secondary | ICD-10-CM | POA: Diagnosis present

## 2023-06-19 DIAGNOSIS — I270 Primary pulmonary hypertension: Secondary | ICD-10-CM | POA: Diagnosis not present

## 2023-06-19 DIAGNOSIS — N184 Chronic kidney disease, stage 4 (severe): Secondary | ICD-10-CM | POA: Diagnosis not present

## 2023-06-19 DIAGNOSIS — L89322 Pressure ulcer of left buttock, stage 2: Secondary | ICD-10-CM | POA: Diagnosis present

## 2023-06-19 DIAGNOSIS — L97314 Non-pressure chronic ulcer of right ankle with necrosis of bone: Secondary | ICD-10-CM | POA: Diagnosis not present

## 2023-06-19 DIAGNOSIS — N1831 Chronic kidney disease, stage 3a: Secondary | ICD-10-CM | POA: Diagnosis not present

## 2023-06-19 DIAGNOSIS — Z794 Long term (current) use of insulin: Secondary | ICD-10-CM | POA: Diagnosis not present

## 2023-06-19 DIAGNOSIS — E1122 Type 2 diabetes mellitus with diabetic chronic kidney disease: Secondary | ICD-10-CM | POA: Diagnosis not present

## 2023-06-19 DIAGNOSIS — S82851D Displaced trimalleolar fracture of right lower leg, subsequent encounter for closed fracture with routine healing: Secondary | ICD-10-CM | POA: Diagnosis not present

## 2023-06-19 DIAGNOSIS — Z472 Encounter for removal of internal fixation device: Secondary | ICD-10-CM | POA: Diagnosis not present

## 2023-06-19 DIAGNOSIS — Y793 Surgical instruments, materials and orthopedic devices (including sutures) associated with adverse incidents: Secondary | ICD-10-CM | POA: Diagnosis present

## 2023-06-19 DIAGNOSIS — Z7401 Bed confinement status: Secondary | ICD-10-CM | POA: Diagnosis not present

## 2023-06-19 DIAGNOSIS — E66812 Obesity, class 2: Secondary | ICD-10-CM | POA: Diagnosis present

## 2023-06-19 DIAGNOSIS — J984 Other disorders of lung: Secondary | ICD-10-CM

## 2023-06-19 DIAGNOSIS — R531 Weakness: Secondary | ICD-10-CM | POA: Diagnosis not present

## 2023-06-19 DIAGNOSIS — J449 Chronic obstructive pulmonary disease, unspecified: Secondary | ICD-10-CM | POA: Diagnosis present

## 2023-06-19 DIAGNOSIS — M86271 Subacute osteomyelitis, right ankle and foot: Secondary | ICD-10-CM | POA: Diagnosis not present

## 2023-06-19 DIAGNOSIS — M1A9XX0 Chronic gout, unspecified, without tophus (tophi): Secondary | ICD-10-CM | POA: Diagnosis not present

## 2023-06-19 DIAGNOSIS — R29810 Facial weakness: Secondary | ICD-10-CM | POA: Diagnosis not present

## 2023-06-19 DIAGNOSIS — Z87891 Personal history of nicotine dependence: Secondary | ICD-10-CM

## 2023-06-19 DIAGNOSIS — I4819 Other persistent atrial fibrillation: Secondary | ICD-10-CM | POA: Diagnosis not present

## 2023-06-19 DIAGNOSIS — I2721 Secondary pulmonary arterial hypertension: Secondary | ICD-10-CM | POA: Diagnosis not present

## 2023-06-19 DIAGNOSIS — Z955 Presence of coronary angioplasty implant and graft: Secondary | ICD-10-CM

## 2023-06-19 DIAGNOSIS — Z981 Arthrodesis status: Secondary | ICD-10-CM

## 2023-06-19 DIAGNOSIS — N400 Enlarged prostate without lower urinary tract symptoms: Secondary | ICD-10-CM | POA: Diagnosis present

## 2023-06-19 DIAGNOSIS — Z9889 Other specified postprocedural states: Secondary | ICD-10-CM | POA: Diagnosis not present

## 2023-06-19 DIAGNOSIS — Z5181 Encounter for therapeutic drug level monitoring: Secondary | ICD-10-CM

## 2023-06-19 DIAGNOSIS — Z7902 Long term (current) use of antithrombotics/antiplatelets: Secondary | ICD-10-CM

## 2023-06-19 DIAGNOSIS — E1169 Type 2 diabetes mellitus with other specified complication: Secondary | ICD-10-CM | POA: Diagnosis not present

## 2023-06-19 DIAGNOSIS — M6281 Muscle weakness (generalized): Secondary | ICD-10-CM | POA: Diagnosis not present

## 2023-06-19 DIAGNOSIS — Z6836 Body mass index (BMI) 36.0-36.9, adult: Secondary | ICD-10-CM

## 2023-06-19 DIAGNOSIS — Z79899 Other long term (current) drug therapy: Secondary | ICD-10-CM

## 2023-06-19 DIAGNOSIS — T847XXA Infection and inflammatory reaction due to other internal orthopedic prosthetic devices, implants and grafts, initial encounter: Principal | ICD-10-CM | POA: Diagnosis present

## 2023-06-19 DIAGNOSIS — I1310 Hypertensive heart and chronic kidney disease without heart failure, with stage 1 through stage 4 chronic kidney disease, or unspecified chronic kidney disease: Secondary | ICD-10-CM | POA: Diagnosis not present

## 2023-06-19 DIAGNOSIS — I10 Essential (primary) hypertension: Secondary | ICD-10-CM | POA: Diagnosis present

## 2023-06-19 DIAGNOSIS — E038 Other specified hypothyroidism: Secondary | ICD-10-CM | POA: Diagnosis not present

## 2023-06-19 DIAGNOSIS — I480 Paroxysmal atrial fibrillation: Secondary | ICD-10-CM | POA: Diagnosis present

## 2023-06-19 DIAGNOSIS — Z79890 Hormone replacement therapy (postmenopausal): Secondary | ICD-10-CM

## 2023-06-19 DIAGNOSIS — E1151 Type 2 diabetes mellitus with diabetic peripheral angiopathy without gangrene: Secondary | ICD-10-CM | POA: Diagnosis present

## 2023-06-19 DIAGNOSIS — I130 Hypertensive heart and chronic kidney disease with heart failure and stage 1 through stage 4 chronic kidney disease, or unspecified chronic kidney disease: Secondary | ICD-10-CM | POA: Diagnosis present

## 2023-06-19 DIAGNOSIS — I2510 Atherosclerotic heart disease of native coronary artery without angina pectoris: Secondary | ICD-10-CM | POA: Diagnosis present

## 2023-06-19 DIAGNOSIS — R58 Hemorrhage, not elsewhere classified: Secondary | ICD-10-CM | POA: Diagnosis not present

## 2023-06-19 DIAGNOSIS — L89312 Pressure ulcer of right buttock, stage 2: Secondary | ICD-10-CM | POA: Diagnosis not present

## 2023-06-19 DIAGNOSIS — I4891 Unspecified atrial fibrillation: Secondary | ICD-10-CM | POA: Diagnosis not present

## 2023-06-19 DIAGNOSIS — Z7901 Long term (current) use of anticoagulants: Secondary | ICD-10-CM

## 2023-06-19 DIAGNOSIS — I5032 Chronic diastolic (congestive) heart failure: Secondary | ICD-10-CM | POA: Diagnosis not present

## 2023-06-19 DIAGNOSIS — R197 Diarrhea, unspecified: Secondary | ICD-10-CM | POA: Diagnosis not present

## 2023-06-19 DIAGNOSIS — T84196A Other mechanical complication of internal fixation device of bone of right lower leg, initial encounter: Secondary | ICD-10-CM | POA: Diagnosis not present

## 2023-06-19 DIAGNOSIS — D631 Anemia in chronic kidney disease: Secondary | ICD-10-CM | POA: Diagnosis present

## 2023-06-19 DIAGNOSIS — Z4889 Encounter for other specified surgical aftercare: Secondary | ICD-10-CM | POA: Diagnosis not present

## 2023-06-19 DIAGNOSIS — E43 Unspecified severe protein-calorie malnutrition: Secondary | ICD-10-CM | POA: Diagnosis not present

## 2023-06-19 DIAGNOSIS — T847XXD Infection and inflammatory reaction due to other internal orthopedic prosthetic devices, implants and grafts, subsequent encounter: Secondary | ICD-10-CM | POA: Diagnosis not present

## 2023-06-19 DIAGNOSIS — T84624D Infection and inflammatory reaction due to internal fixation device of right fibula, subsequent encounter: Secondary | ICD-10-CM | POA: Diagnosis not present

## 2023-06-19 DIAGNOSIS — R0902 Hypoxemia: Secondary | ICD-10-CM | POA: Diagnosis present

## 2023-06-19 DIAGNOSIS — M7989 Other specified soft tissue disorders: Secondary | ICD-10-CM | POA: Diagnosis not present

## 2023-06-19 DIAGNOSIS — T84418A Breakdown (mechanical) of other internal orthopedic devices, implants and grafts, initial encounter: Secondary | ICD-10-CM | POA: Diagnosis not present

## 2023-06-19 DIAGNOSIS — T84098A Other mechanical complication of other internal joint prosthesis, initial encounter: Secondary | ICD-10-CM | POA: Diagnosis not present

## 2023-06-19 DIAGNOSIS — L03115 Cellulitis of right lower limb: Secondary | ICD-10-CM | POA: Diagnosis not present

## 2023-06-19 DIAGNOSIS — R6889 Other general symptoms and signs: Secondary | ICD-10-CM | POA: Diagnosis not present

## 2023-06-19 DIAGNOSIS — I129 Hypertensive chronic kidney disease with stage 1 through stage 4 chronic kidney disease, or unspecified chronic kidney disease: Secondary | ICD-10-CM | POA: Diagnosis not present

## 2023-06-19 LAB — CBC WITH DIFFERENTIAL/PLATELET
Abs Immature Granulocytes: 0.02 10*3/uL (ref 0.00–0.07)
Basophils Absolute: 0.1 10*3/uL (ref 0.0–0.1)
Basophils Relative: 1 %
Eosinophils Absolute: 0.2 10*3/uL (ref 0.0–0.5)
Eosinophils Relative: 4 %
HCT: 27 % — ABNORMAL LOW (ref 39.0–52.0)
Hemoglobin: 8.8 g/dL — ABNORMAL LOW (ref 13.0–17.0)
Immature Granulocytes: 0 %
Lymphocytes Relative: 26 %
Lymphs Abs: 1.3 10*3/uL (ref 0.7–4.0)
MCH: 29 pg (ref 26.0–34.0)
MCHC: 32.6 g/dL (ref 30.0–36.0)
MCV: 89.1 fL (ref 80.0–100.0)
Monocytes Absolute: 0.4 10*3/uL (ref 0.1–1.0)
Monocytes Relative: 9 %
Neutro Abs: 2.9 10*3/uL (ref 1.7–7.7)
Neutrophils Relative %: 60 %
Platelets: 158 10*3/uL (ref 150–400)
RBC: 3.03 MIL/uL — ABNORMAL LOW (ref 4.22–5.81)
RDW: 15.8 % — ABNORMAL HIGH (ref 11.5–15.5)
WBC: 4.9 10*3/uL (ref 4.0–10.5)
nRBC: 0 % (ref 0.0–0.2)

## 2023-06-19 LAB — COMPREHENSIVE METABOLIC PANEL WITH GFR
ALT: 9 U/L (ref 0–44)
AST: 34 U/L (ref 15–41)
Albumin: 2.3 g/dL — ABNORMAL LOW (ref 3.5–5.0)
Alkaline Phosphatase: 111 U/L (ref 38–126)
Anion gap: 11 (ref 5–15)
BUN: 17 mg/dL (ref 8–23)
CO2: 20 mmol/L — ABNORMAL LOW (ref 22–32)
Calcium: 8.3 mg/dL — ABNORMAL LOW (ref 8.9–10.3)
Chloride: 101 mmol/L (ref 98–111)
Creatinine, Ser: 2.18 mg/dL — ABNORMAL HIGH (ref 0.61–1.24)
GFR, Estimated: 32 mL/min — ABNORMAL LOW (ref >60)
Glucose, Bld: 159 mg/dL — ABNORMAL HIGH (ref 70–99)
Potassium: 4.4 mmol/L (ref 3.5–5.1)
Sodium: 132 mmol/L — ABNORMAL LOW (ref 135–145)
Total Bilirubin: 1.3 mg/dL — ABNORMAL HIGH (ref 0.0–1.2)
Total Protein: 5.4 g/dL — ABNORMAL LOW (ref 6.5–8.1)

## 2023-06-19 LAB — C-REACTIVE PROTEIN: CRP: 5 mg/dL — ABNORMAL HIGH (ref <1.0)

## 2023-06-19 LAB — I-STAT CG4 LACTIC ACID, ED: Lactic Acid, Venous: 1.8 mmol/L (ref 0.5–1.9)

## 2023-06-19 LAB — SEDIMENTATION RATE: Sed Rate: 85 mm/h — ABNORMAL HIGH (ref 0–16)

## 2023-06-19 MED ORDER — HYDROMORPHONE HCL 1 MG/ML IJ SOLN
0.5000 mg | Freq: Once | INTRAMUSCULAR | Status: AC
  Administered 2023-06-19: 0.5 mg via INTRAVENOUS
  Filled 2023-06-19: qty 1

## 2023-06-19 MED ORDER — CIPROFLOXACIN HCL 500 MG PO TABS
500.0000 mg | ORAL_TABLET | Freq: Two times a day (BID) | ORAL | Status: DC
  Administered 2023-06-20 (×2): 500 mg via ORAL
  Filled 2023-06-19 (×2): qty 1

## 2023-06-19 MED ORDER — LEVOTHYROXINE SODIUM 88 MCG PO TABS
88.0000 ug | ORAL_TABLET | Freq: Every day | ORAL | Status: DC
  Administered 2023-06-21 – 2023-06-28 (×8): 88 ug via ORAL
  Filled 2023-06-19 (×8): qty 1

## 2023-06-19 MED ORDER — FUROSEMIDE 40 MG PO TABS
40.0000 mg | ORAL_TABLET | Freq: Every day | ORAL | Status: DC
  Administered 2023-06-20: 40 mg via ORAL
  Filled 2023-06-19: qty 1

## 2023-06-19 MED ORDER — AMIODARONE HCL 200 MG PO TABS
200.0000 mg | ORAL_TABLET | Freq: Two times a day (BID) | ORAL | Status: DC
  Administered 2023-06-20 – 2023-06-28 (×17): 200 mg via ORAL
  Filled 2023-06-19 (×17): qty 1

## 2023-06-19 MED ORDER — SODIUM CHLORIDE 0.9 % IV SOLN: INTRAVENOUS | Status: DC

## 2023-06-19 MED ORDER — OXYCODONE HCL 5 MG PO TABS
5.0000 mg | ORAL_TABLET | ORAL | Status: DC | PRN
  Administered 2023-06-20 – 2023-06-26 (×8): 5 mg via ORAL
  Filled 2023-06-19 (×9): qty 1

## 2023-06-19 MED ORDER — METRONIDAZOLE 500 MG/100ML IV SOLN
500.0000 mg | Freq: Once | INTRAVENOUS | Status: AC
  Administered 2023-06-19: 500 mg via INTRAVENOUS
  Filled 2023-06-19: qty 100

## 2023-06-19 MED ORDER — DULOXETINE HCL 60 MG PO CPEP
60.0000 mg | ORAL_CAPSULE | Freq: Two times a day (BID) | ORAL | Status: DC
  Administered 2023-06-20 – 2023-06-28 (×17): 60 mg via ORAL
  Filled 2023-06-19 (×17): qty 1

## 2023-06-19 MED ORDER — TAMSULOSIN HCL 0.4 MG PO CAPS
0.4000 mg | ORAL_CAPSULE | Freq: Every day | ORAL | Status: DC
  Administered 2023-06-20 – 2023-06-28 (×9): 0.4 mg via ORAL
  Filled 2023-06-19 (×9): qty 1

## 2023-06-19 MED ORDER — ONDANSETRON HCL 4 MG PO TABS: 4.0000 mg | ORAL_TABLET | Freq: Four times a day (QID) | ORAL | Status: DC | PRN

## 2023-06-19 MED ORDER — ALLOPURINOL 100 MG PO TABS
50.0000 mg | ORAL_TABLET | Freq: Every day | ORAL | Status: DC
  Administered 2023-06-20 – 2023-06-25 (×6): 50 mg via ORAL
  Filled 2023-06-19 (×6): qty 1

## 2023-06-19 MED ORDER — ACETAMINOPHEN 500 MG PO TABS: 500.0000 mg | ORAL_TABLET | Freq: Four times a day (QID) | ORAL | Status: DC | PRN

## 2023-06-19 MED ORDER — ACETAMINOPHEN 650 MG RE SUPP: 650.0000 mg | Freq: Four times a day (QID) | RECTAL | Status: DC | PRN

## 2023-06-19 MED ORDER — VANCOMYCIN HCL 2000 MG/400ML IV SOLN
2000.0000 mg | Freq: Once | INTRAVENOUS | Status: DC
  Filled 2023-06-19: qty 400

## 2023-06-19 MED ORDER — ENOXAPARIN SODIUM 40 MG/0.4ML IJ SOSY
40.0000 mg | PREFILLED_SYRINGE | Freq: Every day | INTRAMUSCULAR | Status: DC
  Filled 2023-06-19: qty 0.4

## 2023-06-19 MED ORDER — FERROUS SULFATE 325 (65 FE) MG PO TABS
325.0000 mg | ORAL_TABLET | ORAL | Status: DC
  Administered 2023-06-20 – 2023-06-27 (×4): 325 mg via ORAL
  Filled 2023-06-19 (×4): qty 1

## 2023-06-19 MED ORDER — INSULIN ASPART 100 UNIT/ML IJ SOLN
0.0000 [IU] | Freq: Three times a day (TID) | INTRAMUSCULAR | Status: DC
  Administered 2023-06-20 – 2023-06-21 (×2): 4 [IU] via SUBCUTANEOUS

## 2023-06-19 MED ORDER — EZETIMIBE 10 MG PO TABS
10.0000 mg | ORAL_TABLET | Freq: Every day | ORAL | Status: DC
  Administered 2023-06-20 – 2023-06-28 (×9): 10 mg via ORAL
  Filled 2023-06-19 (×9): qty 1

## 2023-06-19 MED ORDER — PANTOPRAZOLE SODIUM 40 MG PO TBEC
40.0000 mg | DELAYED_RELEASE_TABLET | Freq: Every day | ORAL | Status: DC
  Administered 2023-06-20 – 2023-06-28 (×9): 40 mg via ORAL
  Filled 2023-06-19 (×9): qty 1

## 2023-06-19 MED ORDER — ADULT MULTIVITAMIN W/MINERALS CH
1.0000 | ORAL_TABLET | Freq: Every day | ORAL | Status: DC
  Administered 2023-06-20 – 2023-06-28 (×9): 1 via ORAL
  Filled 2023-06-19 (×9): qty 1

## 2023-06-19 MED ORDER — SODIUM CHLORIDE 0.9 % IV SOLN
2.0000 g | Freq: Once | INTRAVENOUS | Status: DC
  Filled 2023-06-19: qty 20

## 2023-06-19 MED ORDER — ONDANSETRON HCL 4 MG/2ML IJ SOLN: 4.0000 mg | Freq: Four times a day (QID) | INTRAMUSCULAR | Status: DC | PRN

## 2023-06-19 MED ORDER — NITROGLYCERIN 0.4 MG SL SUBL: 0.4000 mg | SUBLINGUAL_TABLET | SUBLINGUAL | Status: DC | PRN

## 2023-06-19 MED ORDER — ACETAMINOPHEN 325 MG PO TABS
650.0000 mg | ORAL_TABLET | Freq: Four times a day (QID) | ORAL | Status: DC | PRN
  Administered 2023-06-20: 650 mg via ORAL
  Filled 2023-06-19 (×2): qty 2

## 2023-06-19 MED ORDER — METHOCARBAMOL 500 MG PO TABS
500.0000 mg | ORAL_TABLET | Freq: Four times a day (QID) | ORAL | Status: DC | PRN
  Administered 2023-06-20 – 2023-06-23 (×3): 500 mg via ORAL
  Filled 2023-06-19 (×4): qty 1

## 2023-06-19 MED ORDER — MAGNESIUM OXIDE -MG SUPPLEMENT 400 (240 MG) MG PO TABS
400.0000 mg | ORAL_TABLET | Freq: Every day | ORAL | Status: DC
  Administered 2023-06-20 – 2023-06-28 (×9): 400 mg via ORAL
  Filled 2023-06-19 (×9): qty 1

## 2023-06-19 MED ORDER — SODIUM CHLORIDE 0.9 % IV SOLN
2.0000 g | Freq: Two times a day (BID) | INTRAVENOUS | Status: DC
  Administered 2023-06-19 – 2023-06-22 (×7): 2 g via INTRAVENOUS
  Filled 2023-06-19 (×7): qty 12.5

## 2023-06-19 MED ORDER — INSULIN GLARGINE-YFGN 100 UNIT/ML ~~LOC~~ SOLN
10.0000 [IU] | Freq: Every day | SUBCUTANEOUS | Status: DC
  Administered 2023-06-20 (×2): 10 [IU] via SUBCUTANEOUS
  Filled 2023-06-19 (×3): qty 0.1

## 2023-06-19 MED ORDER — LORATADINE 10 MG PO TABS
10.0000 mg | ORAL_TABLET | Freq: Every day | ORAL | Status: DC
  Administered 2023-06-20 – 2023-06-28 (×9): 10 mg via ORAL
  Filled 2023-06-19 (×9): qty 1

## 2023-06-19 MED ORDER — GABAPENTIN 300 MG PO CAPS
300.0000 mg | ORAL_CAPSULE | Freq: Every day | ORAL | Status: DC
  Administered 2023-06-20 – 2023-06-27 (×8): 300 mg via ORAL
  Filled 2023-06-19 (×8): qty 1

## 2023-06-19 MED ORDER — VANCOMYCIN HCL IN DEXTROSE 1-5 GM/200ML-% IV SOLN: 1000.0000 mg | Freq: Once | INTRAVENOUS | Status: DC

## 2023-06-19 MED ORDER — VANCOMYCIN HCL 2000 MG/400ML IV SOLN
2000.0000 mg | INTRAVENOUS | Status: DC
  Administered 2023-06-20: 2000 mg via INTRAVENOUS
  Filled 2023-06-19: qty 400

## 2023-06-19 MED ORDER — HYDROMORPHONE HCL 1 MG/ML IJ SOLN: 0.5000 mg | INTRAMUSCULAR | Status: DC | PRN

## 2023-06-19 MED ORDER — INSULIN ASPART 100 UNIT/ML IJ SOLN
0.0000 [IU] | Freq: Every day | INTRAMUSCULAR | Status: DC
  Administered 2023-06-21: 3 [IU] via SUBCUTANEOUS

## 2023-06-19 NOTE — ED Triage Notes (Addendum)
 PT bib [facility] from home with a c/o post op surgery. PT had ankle surgery where hardware was installed, on MON, the home health care nurse noted that there was only a thin layer of skin over incision spot, She called the physician and physician stated that as long as it wasn't exposed, it would be fine. Home health returned today and it was exposed. PT was told to return to hospital for another surgery.PT also has been on antibiotics and has had diarrhea for a week. 350l of fluid given en route. CBG:244

## 2023-06-19 NOTE — H&P (Signed)
 History and Physical    Patient: Harold [name]:[id number] DOB: 05-Aug-1955 DOA: 06/19/2023 DOS: the patient was seen and examined on 06/19/2023 PCP: Tita Form, MD  Patient coming from: Home  Chief Complaint:  Chief Complaint  Patient presents with   Post-op Problem   HPI: Harold [name] is a 68 y.o. male with medical history significant of diabetes, coronary artery disease status post PCI, chronic kidney disease stage IV, chronic venous insufficiency, COPD, depression, diabetic neuropathy, hyperlipidemia, GERD, essential hypertension, pulmonary hypertension, obstructive sleep apnea, diabetic foot ulcer with recent surgery on March 27 where he had arthrodesis with nail insertion involving the right ankle.  Presenting today with nonhealing, drainage and obvious infection of the ankle.  Patient appears to have postoperative wound infection related to the hardware.  Orthopedic consulted and will see patient in the morning.  Patient being admitted for IV antibiotics.  Review of Systems: As mentioned in the history of present illness. All other systems reviewed and are negative. Past Medical History:  Diagnosis Date   Anemia    Anginal pain (HCC) 04/2013   Cardiac cath showed patent stents with distal LAD, circumflex-OM and RCA disease in small vessels.   Anxiety    Atrial fibrillation (HCC) 12/2021   CAD S/P percutaneous coronary angioplasty 2003, 04/2012   status post PCI to LAD, circumflex-OM 2, RCA   Carotid artery occlusion    Chronic renal insufficiency, stage II (mild)    Chronic venous insufficiency    varicosities, no reflux; dopplers 04/14/12- valvular insufficiency in the R and L GSV   Complication of anesthesia    COPD, mild (HCC)    Depression    situaltional    Diabetes mellitus    Diabetic neuropathy (HCC) 08/24/2019   Dyslipidemia associated with type 2 diabetes mellitus (HCC)    Fall at home, initial encounter 01/02/2023   GERD (gastroesophageal reflux  disease)    History of cardioversion 12/2021   at [facility]   HTN (hypertension)    Hypothyroidism    Neuromuscular disorder (HCC)    neuropathy in feet   Neuropathy    notably improved following PCI with improved cardiac function   Obesity    Obesity, Class II, BMI 35.0-39.9, with comorbidity (see actual BMI)    BMI 39; wgt loss efforts in place; seeing Dietician   Open ankle fracture 01/02/2023   Orthostatic hypotension    PONV (postoperative nausea and vomiting)    "Patch Works"   Pulmonary hypertension (HCC) 04/2012   PA pressure   Sleep apnea    uses nightly   Vitamin D  deficiency    per facility notes   Past Surgical History:  Procedure Laterality Date   ABDOMINAL AORTAGRAM N/A 11/15/2011   Procedure: ABDOMINAL Tommi Fraise;  Surgeon: Richrd Char, MD;  Location: [facility] CATH LAB;  Service: Cardiovascular;  [name];   ABDOMINAL AORTOGRAM W/LOWER EXTREMITY N/A 10/13/2019   Procedure: ABDOMINAL AORTOGRAM W/LOWER EXTREMITY;  Surgeon: Wenona Hamilton, MD;  Location: [facility] INVASIVE CV LAB;  Service: CV: Non-obst Ao-Iliac. R SFA-PopA patent with significant calcific stenosis of TP trunk-prox PTA (dominant) & CTO ATA  -> R PTA-TP PTCA   ABDOMINAL AORTOGRAM W/LOWER EXTREMITY N/A 11/06/2022   Procedure: ABDOMINAL AORTOGRAM W/LOWER EXTREMITY;  Surgeon: Wenona Hamilton, MD;  Location: [facility] INVASIVE CV LAB;  Service: Cardiovascular;  [name];   ABDOMINAL AORTOGRAM W/LOWER EXTREMITY Right 04/01/2023   Procedure: ABDOMINAL AORTOGRAM W/LOWER EXTREMITY;  Surgeon: Margherita Shell, MD;  Location: [facility]  INVASIVE CV LAB;  Service: Cardiovascular;  Laterality: Right;   ABIs  04/27/2012   mild bilateral arterial insufficiency   ANKLE FUSION Right 05/15/2023   Procedure: ARTHRODESIS ANKLE WITH HINDFOOT FUSION NAIL;  Surgeon: Laneta Pintos, MD;  [name];  Service: Orthopedics;  Laterality: Right;   APPLICATION OF WOUND VAC Right 03/31/2023   Procedure: APPLICATION OF WOUND  VAC;  Surgeon: Laneta Pintos, MD;  [name];  Service: Orthopedics;  Laterality: Right;   BACK SURGERY  2005 x1   X2-2010   BUNIONECTOMY Right 11/11/2013   Procedure: RIGHT FOOT SILVER BUNIONECTOMY;  Surgeon: Amada Backer, MD;  Location: [facility];  Service: Orthopedics;  Laterality: Right;   CARDIAC CATHETERIZATION  12/11/2001   significant 3V CAD, normal LV function   CARDIOVERSION N/A 03/13/2022   Procedure: CARDIOVERSION;  Surgeon: Wendie Hamburg, MD;  Location: [facility] ENDOSCOPY;  Service: Cardiovascular;  [name];   CARDIOVERSION N/A 07/25/2022   Procedure: CARDIOVERSION;  Surgeon: Hugh Madura, MD;  [facility]: [facility] INVASIVE CV LAB;  Service: Cardiovascular;  [name];   Carotid Doppler  04/27/2012   right internal carotid: Elevated velocities but no evidence of plaque. Left internal carotid 40-59%   CAROTID ENDARTERECTOMY  2005   Right; recent carotid Dopplers notes elevated velocities.    colonscopy     CORONARY ANGIOPLASTY  12/21/2001   PTCA of the distal and mid AV groove circ, unsuccessful PTCA of second OM total occlusion, unsuccessful PTCA of the apical LAD total occlusion   CORONARY ANGIOPLASTY WITH STENT PLACEMENT  04/29/2012   PCI to 3 RCA lesions, Promus Premiere 2.68mmx8mm distally, mid was 2.7mx28mm and proximally 2.75x46mm, EF 55-60%   CORONARY STENT PLACEMENT  04/28/2012   PCI to LAD (3x62mm Xience DES postdilated to 3.25) and circ prox and mid (2 overlappinmg 2.24mmx12mmXience DES postdilated to 2.53mm)   DOPPLER ECHOCARDIOGRAPHY  04/28/2012   poor quality study: EF estimated 60-65%; unable to assess diastolic function (previously noted to have diastolic dysfunction); severely dilated left atrium and mild right atrium; dilated IVC consistent with increased central venous pressure.Aaron Aas   EXTERNAL FIXATION LEG Right 05/12/2023   Procedure: EXTERNAL FIXATION, LOWER EXTREMITY;  Surgeon: Laneta Pintos, MD;  [name];  Service:  Orthopedics;  Laterality: Right;   HARDWARE REMOVAL Right 03/31/2023   Procedure: HARDWARE REMOVAL RIGHT ANKLE;  Surgeon: Laneta Pintos, MD;  [name];  Service: Orthopedics;  Laterality: Right;   HARDWARE REMOVAL Right 01/28/2023   Procedure: REMOVAL OF HARDWARE RIGHT ANKLE;  Surgeon: Hardy Lia, MD;  [name];  Service: Orthopedics;  Laterality: Right;   HERNIA REPAIR     I & D EXTREMITY Right 01/02/2023   Procedure: IRRIGATION AND DEBRIDEMENT RIGHT ANKLE;  Surgeon: Hardy Lia, MD;  [name];  Service: Orthopedics;  Laterality: Right;   I & D EXTREMITY Right 04/02/2023   Procedure: DEBRIDEMENT RIGHT ANKLE;  Surgeon: Timothy Ford, MD;  Location: [facility] OR;  Service: Orthopedics;  Laterality: Right;   IR FLUORO GUIDE CV LINE LEFT  04/22/2023   IR FLUORO GUIDE CV LINE RIGHT  04/07/2023   IR PATIENT EVAL TECH 0-60 MINS  04/07/2023   IR RADIOLOGIST EVAL & MGMT  04/08/2023   IR REMOVAL TUN CV CATH W/O FL  05/05/2023   IR US  GUIDE VASC ACCESS LEFT  04/22/2023   IR US  GUIDE VASC ACCESS RIGHT  04/07/2023   LEFT AND RIGHT HEART CATHETERIZATION WITH CORONARY ANGIOGRAM N/A 04/27/2012  Procedure: LEFT AND RIGHT HEART CATHETERIZATION WITH CORONARY ANGIOGRAM;  Surgeon: Arleen Lacer, MD;  Location[facility] C[facility] LAB;  Service: Cardiovascular;  [name];   LEFT AND RIGHT HEART CATHETERIZATION WITH CORONARY ANGIOGRAM N/A 05/14/2013   Procedure: LEFT AND RIGHT HEART CATHETERIZATION WITH CORONARY ANGIOGRAM;  Surgeon: Millicent Ally, MD;  [name]; PCWP ;; [name], [name], Patent RCA ostial mid and distal stents - 70% distal runoff Dz   LUMBAR LAMINECTOMY/DECOMPRESSION MICRODISCECTOMY  01/07/2012   Procedure: LUMBAR LAMINECTOMY/DECOMPRESSION MICRODISCECTOMY 1 LEVEL;  Surgeon: Pasty Bongo, MD;  [name];  Service:  Neurosurgery;  Laterality: Bilateral;   Lumbar Three-Four Decompression   LUMBAR LAMINECTOMY/DECOMPRESSION MICRODISCECTOMY N/A 07/26/2020   Procedure: Lumbar two-three Laminectomy/Foraminotomy;  Surgeon: Audie Bleacher, MD;  [name];  Service: Neurosurgery;  [name];   ORIF ANKLE FRACTURE Right 01/02/2023   Procedure: OPEN REDUCTION INTERNAL FIXATION (ORIF)RIGHT ANKLE FRACTURE;  Surgeon: Hardy Lia, MD;  [name];  Service: Orthopedics;  Laterality: Right;   PERCUTANEOUS CORONARY STENT INTERVENTION (PCI-S) N/A 04/28/2012   Procedure: PERCUTANEOUS CORONARY STENT INTERVENTION (PCI-S);  Surgeon: Millicent Ally, MD;  Location: [facility] CATH LAB;  Service: Cardiovascular;  [name];   PERCUTANEOUS CORONARY STENT INTERVENTION (PCI-S) N/A 04/29/2012   Procedure: PERCUTANEOUS CORONARY STENT INTERVENTION (PCI-S);  Surgeon: Arleen Lacer, MD;  Location: [facility] CATH LAB;  Service: Cardiovascular;  [name];   PERIPHERAL VASCULAR ATHERECTOMY  11/06/2022   Procedure: PERIPHERAL VASCULAR ATHERECTOMY;  Surgeon: Wenona Hamilton, MD;  [facility] INVAS[facility] CV LAB;  Service: Cardiovascular;;   PERIPHERAL VASCULAR BALLOON ANGIOPLASTY Right 10/13/2019   Procedure: PERIPHERAL VASCULAR BALLOON ANGIOPLASTY;  Surgeon: Wenona Hamilton, MD;  [facility] INVASIVE CV LAB;  Service: Cardiovascular;  Laterality: Right;  PTCA of R TP Trunk into PTA (Drug-coated) => Follow-up ABIs 0.9 on the right and 0.99 on the left.   PERIPHERAL VASCULAR BALLOON ANGIOPLASTY Right 04/01/2023   Procedure: PERIPHERAL VASCULAR BALLOON ANGIOPLASTY;  Surgeon: Margherita Shell, MD;  [facility] INVASIVE CV LAB;  Service: Cardiovascular;  Laterality: Right;   PERIPHERAL VASCULAR INTERVENTION Right 04/01/2023   Procedure: PERIPHERAL VASCULAR INTERVENTION;  Surgeon: Margherita Shell, MD;  [facility] INVASIVE CV LAB;  Service: Cardiovascular;  Laterality: Right;   SPINE SURGERY     UMBILICAL HERNIA REPAIR  2009    steel mesh insert   Social History:  reports that he quit smoking about 20 years ago. His smoking use included cigarettes. He started smoking about 62 years ago. He has a 42 pack-year smoking history. He has never used smokeless tobacco. He reports that he does not drink alcohol and does not use drugs.  Allergies  Allergen Reactions   Doxycycline  Hives and Rash   Septra [Bactrim] Itching   Penicillins Rash    Allergy to All cillin drugs  Ancef  given 9/24 with no obvious reaction   Metformin  And Related Diarrhea and Nausea Only   Other Nausea And Vomiting    General Anesthesia - sometimes causes nausea and vomiting * OK to use scopolamine  patch     Sulfamethoxazole-Trimethoprim Other (See Comments)   Wellbutrin [Bupropion]     Mood Changes    Family History  Adopted: Yes  Family history unknown: Yes    Prior to Admission medications   Medication Sig Start Date End Date Taking?  Authorizing Provider  acetaminophen  (TYLENOL ) 500 MG tablet Take 1 tablet (500 mg total) by mouth every 6 (six) hours as needed for mild pain (pain score 1-3). 01/07/23   Hongalgi, Anand D, MD  allopurinol  (ZYLOPRIM ) 100 MG tablet Take 0.5 tablets (50 mg total) by mouth daily. 05/10/23   Modena Andes, MD  amiodarone  (PACERONE ) 200 MG tablet Take 1 tablet (200 mg total) by mouth 2 (two) times daily. 05/10/23   Modena Andes, MD  apixaban  (ELIQUIS ) 5 MG TABS tablet TAKE 1 TABLET BY MOUTH TWICE A DAY 02/17/23   Arleen Lacer, MD  atorvastatin  (LIPITOR) 40 MG tablet Take 1 tablet (40 mg total) by mouth daily. 05/10/23 06/17/23  Modena Andes, MD  ciprofloxacin  (CIPRO ) 500 MG tablet Take 1 tablet (500 mg total) by mouth 2 (two) times daily for 14 days. 06/17/23 07/01/23  Manandhar, Sabina, MD  clopidogrel  (PLAVIX ) 75 MG tablet Take 75 mg by mouth every morning. 02/04/23   [provider]  colchicine  0.6 MG tablet Take 1 tablet (0.6 mg total) by mouth every other day. Patient not taking: Reported on 06/17/2023  05/23/23   Haydee Lipa, MD  DULoxetine  (CYMBALTA ) 60 MG capsule Take 60 mg by mouth 2 (two) times daily.    [provider]  ezetimibe  (ZETIA ) 10 MG tablet Take 10 mg by mouth daily. 05/09/22   [provider]  ferrous sulfate  325 (65 FE) MG tablet Take 325 mg by mouth 3 (three) times a week. 06/08/23   [provider]  furosemide  (LASIX ) 80 MG tablet Take 0.5 tablets (40 mg total) by mouth daily. May take additional tablet for weight gain of 3 pounds in one day or five pounds in one week 01/14/23   [place], Rema Care, DO  gabapentin  (NEURONTIN ) 300 MG capsule Take 1 capsule (300 mg total) by mouth at bedtime. 05/10/23   Modena Andes, MD  levothyroxine  (SYNTHROID , LEVOTHROID) 88 MCG tablet Take 88 mcg by mouth daily before breakfast.    [provider]  loratadine  (CLARITIN ) 10 MG tablet Take 1 tablet (10 mg total) by mouth daily. Patient not taking: Reported on 06/17/2023 01/15/23   [place], Eric J, DO  magnesium  oxide (MAG-OX) 400 MG tablet Take 1 tablet by mouth daily. 06/08/23   [provider]  methocarbamol  (ROBAXIN ) 500 MG tablet Take 1 tablet (500 mg total) by mouth every 6 (six) hours as needed for muscle spasms. 05/20/23   Versie Gores, PA-C  Multiple Vitamin (MULTIVITAMIN WITH MINERALS) TABS tablet Take 1 tablet by mouth daily. 01/08/23   Hongalgi, Anand D, MD  nitroGLYCERIN  (NITROSTAT ) 0.4 MG SL tablet PLACE 1 TAB UNDER THE TONGUE EVERY 5 MINUTES AS NEEDED FOR CHEST PAIN (SEVERE PRESSURE OR TIGHTNESS) Patient taking differently: Place 0.4 mg under the tongue every 5 (five) minutes as needed for chest pain. PLACE 1 TAB UNDER THE TONGUE EVERY 5 MINUTES AS NEEDED FOR CHEST PAIN (SEVERE PRESSURE OR TIGHTNESS) 02/22/19   Arleen Lacer, MD  oxyCODONE  (OXY IR/ROXICODONE ) 5 MG immediate release tablet Take 1 tablet (5 mg total) by mouth every 4 (four) hours as needed for severe pain (pain score 7-10). 05/20/23   Versie Gores, PA-C  pantoprazole   (PROTONIX ) 40 MG tablet TAKE 1 TABLET BY MOUTH EVERY DAY 12/13/22   Dunn, Dayna N, PA-C  SODIUM FLUORIDE 5000 PPM 1.1 % PSTE Take 1 Application by mouth in the morning and at bedtime. 02/17/23   [provider]  tamsulosin  (FLOMAX ) 0.4 MG CAPS  capsule Take 0.4 mg by mouth daily. 06/16/23   [provider]  tirzepatide  (MOUNJARO ) 15 MG/0.5ML Pen Inject 15 mg into the skin once a week. Patient taking differently: Inject 15 mg into the skin once a week. On Tuesdays 12/30/22     TRESIBA  FLEXTOUCH 200 UNIT/ML FlexTouch Pen Inject 10 Units into the skin daily. 05/21/23 06/20/23  Haydee Lipa, MD  Vitamin D , Ergocalciferol , (DRISDOL ) 1.25 MG (50000 UNIT) CAPS capsule Take 1 capsule (50,000 Units total) by mouth every 7 (seven) days. 05/27/23   Haydee Lipa, MD    Physical Exam: Vitals:   06/19/23 2009 06/19/23 2135  BP:  136/62  Pulse:  (!) 104  Resp:  18  Temp:  98.8 F (37.1 C)  TempSrc:  Oral  SpO2:  (!) 87%  Weight: 124.5 kg   Height: 6\' 1"  (1.854 m)    Constitutional: Obese, NAD, calm, comfortable Eyes: PERRL, lids and conjunctivae normal ENMT: Mucous membranes are moist. Posterior pharynx clear of any exudate or lesions.Normal dentition.  Neck: normal, supple, no masses, no thyromegaly Respiratory: clear to auscultation bilaterally, no wheezing, no crackles. Normal respiratory effort. No accessory muscle use.  Cardiovascular: Sinus tachycardia, no murmurs / rubs / gallops. No extremity edema. 2+ pedal pulses. No carotid bruits.  Abdomen: no tenderness, no masses palpated. No hepatosplenomegaly. Bowel sounds positive.  Musculoskeletal: Good range of motion, no joint swelling or tenderness, Skin: Right ankle open wound draining with visible hardware Neurologic: CN 2-12 grossly intact. Sensation intact, DTR normal. Strength 5/5 in all 4.  Psychiatric: Normal judgment and insight. Alert and oriented x 3. Normal mood  Data Reviewed:  Temperature 98.8, blood  pressure 136/62, pulse 104 respiratory rate of 18 oxygen sat 87% on room air.  White count is 4.9 hemoglobin 8.8 platelets 158.  Sodium 132 creatinine 2.18 and CO2 of 20 calcium  8.3 , X-ray of the right ankle showed postoperative posttraumatic changes no significant change.  Assessment and Plan:  #1 infected right ankle wound with hardware: Patient will be admitted.  Initiate IV antibiotics.  Orthopedics consult.  Patient will be kept n.p.o. after midnight.  Dr. Curtiss Dowdy who did the original surgery will see the patient  #2 type 2 diabetes: Insulin -dependent.  Initiate long-acting and short acting insulin .  #3 chronic kidney disease stage IV: At baseline.  Continue to monitor  #4 pulmonary hypertension: Patient has hypoxia.  Sats 87%.  Continue oxygenation  #5 essential hypertension: Blood pressure appears controlled.  Continue to monitor.  #6 paroxysmal atrial fibrillation: Patient has been on anticoagulation.  I will hold Eliquis  in case he will have surgery.  DVT prophylaxis with Lovenox   #7 peripheral vascular disease: Will hold Plavix .  Continue statin  #8 obstructive sleep apnea: Continue with CPAP  #9 hypothyroidism: Continue levothyroxine     Advance Care Planning:   Code Status: Full Code   Consults: Dr. Curtiss Dowdy, podiatry  Family Communication: No family at bedside  Severity of Illness: The appropriate patient status for this patient is INPATIENT. Inpatient status is judged to be reasonable and necessary in order to provide the required intensity of service to ensure the patient's safety. The patient's presenting symptoms, physical exam findings, and initial radiographic and laboratory data in the context of their chronic comorbidities is felt to place them at high risk for further clinical deterioration. Furthermore, it is not anticipated that the patient will be medically stable for discharge from the hospital within 2 midnights of admission.   * I certify that at  the point of  admission it is my clinical judgment that the patient will require inpatient hospital care spanning beyond 2 midnights from the point of admission due to high intensity of service, high risk for further deterioration and high frequency of surveillance required.*  AuthorCarolin Chyle, MD 06/19/2023 11:08 PM  For on call review [email or web address].

## 2023-06-19 NOTE — Progress Notes (Addendum)
 Pharmacy Antibiotic Note  Harold [name] is a 68 y.o. male for which pharmacy has been consulted for cefepime  and vancomycin  dosing for  wound infection .  Patient with a history of AF, CAD, COPD, DM, HLD, CKD. Patient presenting with nonhealing, drainage and infection of the ankle following recent surgical procedure.  SCr 2.18 - baseline WBC 4.9; LA 1.8; T 98.8; HR 104; RR 18  Plan: Metronidazole  per MD Cefepime  2g q12hr  Vancomycin  2000 mg q48hr (eAUC 467) unless change in renal function Monitor WBC, fever, renal function, cultures De-escalate when able Levels at steady state  Height: 6\' 1"  (185.4 cm) Weight: 124.5 kg (274 lb 7.6 oz) IBW/kg (Calculated) : 79.9  Temp (24hrs), Avg:98.8 F (37.1 C), Min:98.8 F (37.1 C), Max:98.8 F (37.1 C)  Recent Labs  Lab 06/19/23 2023 06/19/23 2206  WBC 4.9  --   CREATININE 2.18*  --   LATICACIDVEN  --  1.8    Estimated Creatinine Clearance: 44.2 mL/min (A) (by C-G formula based on SCr of 2.18 mg/dL (H)).    Allergies  Allergen Reactions   Doxycycline  Hives and Rash   Septra [Bactrim] Itching   Penicillins Rash    Allergy to All cillin drugs  Ancef  given 9/24 with no obvious reaction   Metformin  And Related Diarrhea and Nausea Only   Other Nausea And Vomiting    General Anesthesia - sometimes causes nausea and vomiting * OK to use scopolamine  patch     Sulfamethoxazole-Trimethoprim Other (See Comments)   Wellbutrin [Bupropion]     Mood Changes   Microbiology results: Pending  Thank you for allowing pharmacy to be a part of this patient's care.  Dionicio Fray, PharmD, BCPS 06/19/2023 11:11 PM ED Clinical Pharmacist -  [phone]

## 2023-06-19 NOTE — Telephone Encounter (Signed)
 Use home phone if need to communicate with Spouse and patient

## 2023-06-19 NOTE — Telephone Encounter (Signed)
 Attempted to call patient, no answer left message requesting a call back.

## 2023-06-19 NOTE — Telephone Encounter (Signed)
 Husband is returning call to a nurse

## 2023-06-19 NOTE — ED Provider Notes (Signed)
 [place] EMERGENCY DEPARTMENT AT [facility] Provider Note  CSN: [id number] Arrival date & time: 06/19/23 2005  Chief Complaint(s) Post-op Problem  HPI Harold [name] is a 68 y.o. male history of atrial fibrillation, coronary artery disease, COPD, diabetes, hyperlipidemia, CKD presenting to the emergency department with foot issue.  Patient has had complicated ankle fracture history with multiple surgeries.  Was recently hospitalized last month after surgery.  Has been having some increasing redness to the wound and his ID physician started him back on Cipro .  His home health nurse noted some new wounds to the posterior heel with they were concern for possible exposed hardware, apparently this was discussed with orthopedics and he was advised to come in to get evaluated.  He denies any fevers or chills, chest pain, abdominal pain, nausea, vomiting.  Does report increasing pain and bleeding from the wound.   Past Medical History Past Medical History:  Diagnosis Date   Anemia    Anginal pain (HCC) 04/2013   Cardiac cath showed patent stents with distal LAD, circumflex-OM and RCA disease in small vessels.   Anxiety    Atrial fibrillation (HCC) 12/2021   CAD S/P percutaneous coronary angioplasty 2003, 04/2012   status post PCI to LAD, circumflex-OM 2, RCA   Carotid artery occlusion    Chronic renal insufficiency, stage II (mild)    Chronic venous insufficiency    varicosities, no reflux; dopplers 04/14/12- valvular insufficiency in the R and L GSV   Complication of anesthesia    COPD, mild (HCC)    Depression    situaltional    Diabetes mellitus    Diabetic neuropathy (HCC) 08/24/2019   Dyslipidemia associated with type 2 diabetes mellitus (HCC)    Fall at home, initial encounter 01/02/2023   GERD (gastroesophageal reflux disease)    History of cardioversion 12/2021   at [facility]   HTN (hypertension)    Hypothyroidism    Neuromuscular disorder (HCC)    neuropathy  in feet   Neuropathy    notably improved following PCI with improved cardiac function   Obesity    Obesity, Class II, BMI 35.0-39.9, with comorbidity (see actual BMI)    BMI 39; wgt loss efforts in place; seeing Dietician   Open ankle fracture 01/02/2023   Orthostatic hypotension    PONV (postoperative nausea and vomiting)    "[employer]"   Pulmonary hypertension (HCC) 04/2012   PA pressure   Sleep apnea    uses nightly   Vitamin D  deficiency    per facility notes   Patient Active Problem List   Diagnosis Date Noted   Open wound of right ankle 06/18/2023   Medication management 06/18/2023   Protein-calorie malnutrition, severe 05/21/2023   Open right ankle fracture, sequela 05/12/2023   Obesity, Class II, BMI 35-39.9 04/23/2023   Osteomyelitis (HCC) 04/17/2023   Cellulitis of right lower extremity 03/28/2023   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, right (HCC) 03/28/2023   Abscess of skin of right ankle 03/28/2023   PAD (peripheral artery disease) (HCC) 03/28/2023   Vitamin D  deficiency 01/07/2023   Prolonged QT interval 01/02/2023   Acute kidney injury superimposed on stage 4 chronic kidney disease (HCC) 01/02/2023   Normocytic anemia 01/02/2023   Paroxysmal atrial fibrillation (HCC) 01/02/2023   Uncontrolled type 2 diabetes mellitus with hyperglycemia, with long-term current use of insulin  (HCC) 01/02/2023   Acquired hypothyroidism 01/02/2023   CKD stage 3b, GFR 30-44 ml/min (HCC) 11/07/2022  CAD in native artery 11/07/2022   PVD (peripheral vascular disease) (HCC) 11/06/2022   Hypercoagulable state due to persistent atrial fibrillation (HCC) 02/19/2022   Persistent atrial fibrillation (HCC) 01/28/2022   Lumbar stenosis with neurogenic claudication 07/26/2020   Diabetic neuropathy (HCC) 08/24/2019   Trigger thumb of left hand 09/02/2018   Atherosclerosis of native artery of both lower extremities with intermittent claudication (HCC) 05/12/2018   Pain of  left hand 03/26/2018   Trigger finger 03/03/2017   Pain in finger of left hand 03/03/2017   Snoring 08/20/2016   Morbid (severe) obesity due to excess calories (HCC) 04/18/2016   Venous stasis of both lower extremities - with edema 02/25/2015   Right-sided chest wall pain 05/08/2014   Gastroesophageal reflux disease without esophagitis 10/10/2013   Chronic heart failure with preserved ejection fraction (HFpEF) (HCC) 06/27/2013   COPD (chronic obstructive pulmonary disease) (HCC) 06/17/2013   Restrictive lung disease 06/17/2013   Obesity, Class III, BMI 40-49.9 (morbid obesity) 09/01/2012   Generalized weakness 09/01/2012   Chronic renal disease, stage IV (HCC) 04/30/2012   OSA on CPAP 04/29/2012   Pulmonary hypertension, 04/29/2012   Dyspnea on exertion   04/26/2012   CAD, LAD/CFX DES 04/28/12- staged RCA DES 04/29/12 after pt declined CABG, cath 05/14/13 stable CAD medical therapy    Carotid artery stenosis without cerebral infarction, bilateral 11/07/2011   Diabetes mellitus type 2 with neurological manifestations (HCC) 04/29/2011   Essential hypertension 04/29/2011   Neuropathy 04/29/2011   Hyperlipidemia associated with type 2 diabetes mellitus (HCC) 04/29/2011   Anxiety and depression 04/29/2011   Home Medication(s) Prior to Admission medications   Medication Sig Start Date End Date Taking? Authorizing Provider  acetaminophen  (TYLENOL ) 500 MG tablet Take 1 tablet (500 mg total) by mouth every 6 (six) hours as needed for mild pain (pain score 1-3). 01/07/23   Hongalgi, Anand D, MD  allopurinol  (ZYLOPRIM ) 100 MG tablet Take 0.5 tablets (50 mg total) by mouth daily. 05/10/23   Modena Andes, MD  amiodarone  (PACERONE ) 200 MG tablet Take 1 tablet (200 mg total) by mouth 2 (two) times daily. 05/10/23   Modena Andes, MD  apixaban  (ELIQUIS ) 5 MG TABS tablet TAKE 1 TABLET BY MOUTH TWICE A DAY 02/17/23   Arleen Lacer, MD  atorvastatin  (LIPITOR) 40 MG tablet Take 1 tablet (40 mg  total) by mouth daily. 05/10/23 06/17/23  Modena Andes, MD  ciprofloxacin  (CIPRO ) 500 MG tablet Take 1 tablet (500 mg total) by mouth 2 (two) times daily for 14 days. 06/17/23 07/01/23  Manandhar, Sabina, MD  clopidogrel  (PLAVIX ) 75 MG tablet Take 75 mg by mouth every morning. 02/04/23   [provider]  colchicine  0.6 MG tablet Take 1 tablet (0.6 mg total) by mouth every other day. Patient not taking: Reported on 06/17/2023 05/23/23   Haydee Lipa, MD  DULoxetine  (CYMBALTA ) 60 MG capsule Take 60 mg by mouth 2 (two) times daily.    [provider]  ezetimibe  (ZETIA ) 10 MG tablet Take 10 mg by mouth daily. 05/09/22   [provider]  ferrous sulfate  325 (65 FE) MG tablet Take 325 mg by mouth 3 (three) times a week. 06/08/23   [provider]  furosemide  (LASIX ) 80 MG tablet Take 0.5 tablets (40 mg total) by mouth daily. May take additional tablet for weight gain of 3 pounds in one day or five pounds in one week 01/14/23   [place], Rema Care, DO  gabapentin  (NEURONTIN ) 300 MG capsule Take 1 capsule (  300 mg total) by mouth at bedtime. 05/10/23   Modena Andes, MD  levothyroxine  (SYNTHROID , LEVOTHROID) 88 MCG tablet Take 88 mcg by mouth daily before breakfast.    [provider]  loratadine  (CLARITIN ) 10 MG tablet Take 1 tablet (10 mg total) by mouth daily. Patient not taking: Reported on 06/17/2023 01/15/23   [place], Rema Care, DO  magnesium  oxide (MAG-OX) 400 MG tablet Take 1 tablet by mouth daily. 06/08/23   [provider]  methocarbamol  (ROBAXIN ) 500 MG tablet Take 1 tablet (500 mg total) by mouth every 6 (six) hours as needed for muscle spasms. 05/20/23   Versie Gores, PA-C  Multiple Vitamin (MULTIVITAMIN WITH MINERALS) TABS tablet Take 1 tablet by mouth daily. 01/08/23   Hongalgi, Anand D, MD  nitroGLYCERIN  (NITROSTAT ) 0.4 MG SL tablet PLACE 1 TAB UNDER THE TONGUE EVERY 5 MINUTES AS NEEDED FOR CHEST PAIN (SEVERE PRESSURE OR TIGHTNESS) Patient taking  differently: Place 0.4 mg under the tongue every 5 (five) minutes as needed for chest pain. PLACE 1 TAB UNDER THE TONGUE EVERY 5 MINUTES AS NEEDED FOR CHEST PAIN (SEVERE PRESSURE OR TIGHTNESS) 02/22/19   Arleen Lacer, MD  oxyCODONE  (OXY IR/ROXICODONE ) 5 MG immediate release tablet Take 1 tablet (5 mg total) by mouth every 4 (four) hours as needed for severe pain (pain score 7-10). 05/20/23   Versie Gores, PA-C  pantoprazole  (PROTONIX ) 40 MG tablet TAKE 1 TABLET BY MOUTH EVERY DAY 12/13/22   Dunn, Dayna N, PA-C  SODIUM FLUORIDE 5000 PPM 1.1 % PSTE Take 1 Application by mouth in the morning and at bedtime. 02/17/23   [provider]  tamsulosin  (FLOMAX ) 0.4 MG CAPS capsule Take 0.4 mg by mouth daily. 06/16/23   [provider]  tirzepatide  (MOUNJARO ) 15 MG/0.5ML Pen Inject 15 mg into the skin once a week. Patient taking differently: Inject 15 mg into the skin once a week. On Tuesdays 12/30/22     TRESIBA  FLEXTOUCH 200 UNIT/ML FlexTouch Pen Inject 10 Units into the skin daily. 05/21/23 06/20/23  Haydee Lipa, MD  Vitamin D , Ergocalciferol , (DRISDOL ) 1.25 MG (50000 UNIT) CAPS capsule Take 1 capsule (50,000 Units total) by mouth every 7 (seven) days. 05/27/23   Haydee Lipa, MD                                                                                                                                    Past Surgical History Past Surgical History:  Procedure Laterality Date   ABDOMINAL AORTAGRAM N/A 11/15/2011   Procedure: ABDOMINAL AORTAGRAM;  Surgeon: Richrd Char, MD;  Location: [facility] CATH LAB;  Service: Cardiovascular;  [name];   ABDOMINAL AORTOGRAM W/LOWER EXTREMITY N/A 10/13/2019   Procedure: ABDOMINAL AORTOGRAM W/LOWER EXTREMITY;  Surgeon: Wenona Hamilton, MD;  Location: [facility] INVASIVE CV LAB;  Service: CV: Non-obst Ao-Iliac. R SFA-PopA patent with significant calcific stenosis of TP trunk-prox PTA (dominant) & CTO  ATA  -> R PTA-TP PTCA   ABDOMINAL  AORTOGRAM W/LOWER EXTREMITY N/A 11/06/2022   Procedure: ABDOMINAL AORTOGRAM W/LOWER EXTREMITY;  Surgeon: Wenona Hamilton, MD;  Location: [facility] INVASIVE CV LAB;  Service: Cardiovascular;  [name];   ABDOMINAL AORTOGRAM W/LOWER EXTREMITY Right 04/01/2023   Procedure: ABDOMINAL AORTOGRAM W/LOWER EXTREMITY;  Surgeon: Margherita Shell, MD;  Location: [facility] INVASIVE CV LAB;  Service: Cardiovascular;  Laterality: Right;   ABIs  04/27/2012   mild bilateral arterial insufficiency   ANKLE FUSION Right 05/15/2023   Procedure: ARTHRODESIS ANKLE WITH HINDFOOT FUSION NAIL;  Surgeon: Laneta Pintos, MD;  [name];  Service: Orthopedics;  Laterality: Right;   APPLICATION OF WOUND VAC Right 03/31/2023   Procedure: APPLICATION OF WOUND VAC;  Surgeon: Laneta Pintos, MD;  [name];  Service: Orthopedics;  Laterality: Right;   BACK SURGERY  2005 x1   X2-2010   BUNIONECTOMY Right 11/11/2013   Procedure: RIGHT FOOT SILVER BUNIONECTOMY;  Surgeon: Amada Backer, MD;  Location: [facility];  Service: Orthopedics;  Laterality: Right;   CARDIAC CATHETERIZATION  12/11/2001   significant 3V CAD, normal LV function   CARDIOVERSION N/A 03/13/2022   Procedure: CARDIOVERSION;  Surgeon: Wendie Hamburg, MD;  Location: [facility] ENDOSCOPY;  Service: Cardiovascular;  [name];   CARDIOVERSION N/A 07/25/2022   Procedure: CARDIOVERSION;  Surgeon: Hugh Madura, MD;  Location: [facility] INVASIVE CV LAB;  Service: Cardiovascular;  [name];   Carotid Doppler  04/27/2012   right internal carotid: Elevated velocities but no evidence of plaque. Left internal carotid 40-59%   CAROTID ENDARTERECTOMY  2005   Right; recent carotid Dopplers notes elevated velocities.    colonscopy     CORONARY ANGIOPLASTY  12/21/2001   PTCA of the distal and mid AV groove circ, unsuccessful PTCA of second OM total occlusion, unsuccessful PTCA of the apical LAD total occlusion   CORONARY ANGIOPLASTY WITH STENT PLACEMENT   04/29/2012   PCI to 3 RCA lesions, Promus Premiere 2.21mmx8mm distally, mid was 2.33mx28mm and proximally 2.75x28mm, EF 55-60%   CORONARY STENT PLACEMENT  04/28/2012   PCI to LAD (3x38mm Xience DES postdilated to 3.25) and circ prox and mid (2 overlappinmg 2.2mmx12mmXience DES postdilated to 2.13mm)   DOPPLER ECHOCARDIOGRAPHY  04/28/2012   poor quality study: EF estimated 60-65%; unable to assess diastolic function (previously noted to have diastolic dysfunction); severely dilated left atrium and mild right atrium; dilated IVC consistent with increased central venous pressure.Aaron Aas   EXTERNAL FIXATION LEG Right 05/12/2023   Procedure: EXTERNAL FIXATION, LOWER EXTREMITY;  Surgeon: Laneta Pintos, MD;  [name];  Service: Orthopedics;  Laterality: Right;   HARDWARE REMOVAL Right 03/31/2023   Procedure: HARDWARE REMOVAL RIGHT ANKLE;  Surgeon: Laneta Pintos, MD;  [name];  Service: Orthopedics;  Laterality: Right;   HARDWARE REMOVAL Right 01/28/2023   Procedure: REMOVAL OF HARDWARE RIGHT ANKLE;  Surgeon: Hardy Lia, MD;  [name];  Service: Orthopedics;  Laterality: Right;   HERNIA REPAIR     I & D EXTREMITY Right 01/02/2023   Procedure: IRRIGATION AND DEBRIDEMENT RIGHT ANKLE;  Surgeon: Hardy Lia, MD;  [name];  Service: Orthopedics;  Laterality: Right;   I & D EXTREMITY Right 04/02/2023   Procedure: DEBRIDEMENT RIGHT ANKLE;  Surgeon: Timothy Ford, MD;  Location: [facility] OR;  Service: Orthopedics;  Laterality: Right;   IR FLUORO GUIDE CV LINE LEFT  04/22/2023   IR FLUORO GUIDE CV LINE RIGHT  04/07/2023   IR PATIENT EVAL TECH 0-60 MINS  04/07/2023   IR RADIOLOGIST EVAL & MGMT  04/08/2023   IR REMOVAL TUN CV CATH W/O FL  05/05/2023   IR US  GUIDE VASC ACCESS LEFT  04/22/2023   IR US  GUIDE VASC ACCESS RIGHT  04/07/2023   LEFT AND RIGHT HEART CATHETERIZATION WITH CORONARY ANGIOGRAM N/A 04/27/2012   Procedure: LEFT AND RIGHT HEART CATHETERIZATION WITH CORONARY ANGIOGRAM;   Surgeon: Arleen Lacer, MD;  Location: [facility] CATH LAB;  Service: Cardiovascular;  [name];   LEFT AND RIGHT HEART CATHETERIZATION WITH CORONARY ANGIOGRAM N/A 05/14/2013   Procedure: LEFT AND RIGHT HEART CATHETERIZATION WITH CORONARY ANGIOGRAM;  Surgeon: Millicent Ally, MD;  [name]; PCWP ;; [name], [name], Patent RCA ostial mid and distal stents - 70% distal runoff Dz   LUMBAR LAMINECTOMY/DECOMPRESSION MICRODISCECTOMY  01/07/2012   Procedure: LUMBAR LAMINECTOMY/DECOMPRESSION MICRODISCECTOMY 1 LEVEL;  Surgeon: Pasty Bongo, MD;  [name];  Service: Neurosurgery;  Laterality: Bilateral;   Lumbar Three-Four Decompression   LUMBAR LAMINECTOMY/DECOMPRESSION MICRODISCECTOMY N/A 07/26/2020   Procedure: Lumbar two-three Laminectomy/Foraminotomy;  Surgeon: Audie Bleacher, MD;  [name];  Service: Neurosurgery;  [name];   ORIF ANKLE FRACTURE Right 01/02/2023   Procedure: OPEN REDUCTION INTERNAL FIXATION (ORIF)RIGHT ANKLE FRACTURE;  Surgeon: Hardy Lia, MD;  [name];  Service: Orthopedics;  Laterality: Right;   PERCUTANEOUS CORONARY STENT INTERVENTION (PCI-S) N/A 04/28/2012   Procedure: PERCUTANEOUS CORONARY STENT INTERVENTION (PCI-S);  Surgeon: Millicent Ally, MD;  Location: [facility] CATH LAB;  Service: Cardiovascular;  [name];   PERCUTANEOUS CORONARY STENT INTERVENTION (PCI-S) N/A 04/29/2012   Procedure: PERCUTANEOUS CORONARY STENT INTERVENTION (PCI-S);  Surgeon: Arleen Lacer, MD;  Location: [facility] CATH LAB;  Service: Cardiovascular;  [name];   PERIPHERAL VASCULAR ATHERECTOMY  11/06/2022   Procedure: PERIPHERAL VASCULAR ATHERECTOMY;  Surgeon: Wenona Hamilton, MD;  [facility] CV LAB;  Service: Cardiovascular;;   PERIPHERAL VASCULAR BALLOON ANGIOPLASTY Right 10/13/2019   Procedure:  PERIPHERAL VASCULAR BALLOON ANGIOPLASTY;  Surgeon: Wenona Hamilton, MD;  [facility]: [facility]VAS[facility] CV LAB;  Service: Cardiovascular;  Laterality: Right;  PTCA of R TP Trunk into PTA (Drug-coated) => Follow-up ABIs 0.9 on the right and 0.99 on the left.   PERIPHERAL VASCULAR BALLOON ANGIOPLASTY Right 04/01/2023   Procedure: PERIPHERAL VASCULAR BALLOON ANGIOPLASTY;  Surgeon: Margherita Shell, MD;  [facility]VAS[facility] CV LAB;  Service: Cardiovascular;  Laterality: Right;   PERIPHERAL VASCULAR INTERVENTION Right 04/01/2023   Procedure: PERIPHERAL VASCULAR INTERVENTION;  Surgeon: Margherita Shell, MD;  [facility]: [facility] INVAS[facility] CV LAB;  Service: Cardiovascular;  Laterality: Right;   SPINE SURGERY     UMBILICAL HERNIA REPAIR  2009   steel mesh insert   Family History Family History  Adopted: Yes  Family history unknown: Yes    Social History Social History   Tobacco Use   Smoking status: Former    Current packs/day: 0.00    Average packs/day: 1 pack/day for 42.0 years (42.0 ttl pk-yrs)    Types: Cigarettes    Start date: 03/22/1961    Quit date: 03/23/2003    Years since quitting: 20.2   Smokeless tobacco: Never   Tobacco comments:    Former smoker 02/19/22  Vaping Use   Vaping status: Never Used  Substance Use Topics  Alcohol use: No    Alcohol/week: 0.0 standard drinks of alcohol   Drug use: No   Allergies Doxycycline , Septra [bactrim], Penicillins, Metformin  and related, Other, Sulfamethoxazole-trimethoprim, and Wellbutrin [bupropion]  Review of Systems Review of Systems  All other systems reviewed and are negative.   Physical Exam Vital Signs  I have reviewed the triage vital signs BP 136/62 (BP Location: Right Arm)   Pulse (!) 104   Temp 98.8 F (37.1 C) (Oral)   Resp 18   Ht 6\' 1"  (1.854 m)   Wt 124.5 kg   SpO2 (!) 87%   BMI 36.21 kg/m  Physical Exam Vitals and nursing note reviewed.  Constitutional:      General: He is not in acute distress.    Appearance: Normal  appearance. He is obese.  HENT:     Mouth/Throat:     Mouth: Mucous membranes are moist.  Eyes:     Conjunctiva/sclera: Conjunctivae normal.  Cardiovascular:     Rate and Rhythm: Normal rate and regular rhythm.  Pulmonary:     Effort: Pulmonary effort is normal. No respiratory distress.     Breath sounds: Normal breath sounds.  Abdominal:     General: Abdomen is flat.     Palpations: Abdomen is soft.     Tenderness: There is no abdominal tenderness.  Musculoskeletal:     Comments: Right ankle with chronic appearing deformity with diffuse swelling, also with erythema and warmth over the ankle and heel, 2 small wounds to the posterior heel, no obviously exposed hardware  Skin:    General: Skin is warm and dry.     Capillary Refill: Capillary refill takes less than 2 seconds.  Neurological:     Mental Status: He is alert and oriented to person, place, and time. Mental status is at baseline.  Psychiatric:        Mood and Affect: Mood normal.        Behavior: Behavior normal.     ED Results and Treatments Labs (all labs ordered are listed, but only abnormal results are displayed) Labs Reviewed  COMPREHENSIVE METABOLIC PANEL WITH GFR - Abnormal; Notable for the following components:      Result Value   Sodium 132 (*)    CO2 20 (*)    Glucose, Bld 159 (*)    Creatinine, Ser 2.18 (*)    Calcium  8.3 (*)    Total Protein 5.4 (*)    Albumin  2.3 (*)    Total Bilirubin 1.3 (*)    GFR, Estimated 32 (*)    All other components within normal limits  CBC WITH DIFFERENTIAL/PLATELET - Abnormal; Notable for the following components:   RBC 3.03 (*)    Hemoglobin 8.8 (*)    HCT 27.0 (*)    RDW 15.8 (*)    All other components within normal limits  C-REACTIVE PROTEIN - Abnormal; Notable for the following components:   CRP 5.0 (*)    All other components within normal limits  CULTURE, BLOOD (ROUTINE X 2)  CULTURE, BLOOD (ROUTINE X 2)  SEDIMENTATION RATE  I-STAT CG4 LACTIC ACID, ED  Radiology DG Ankle Complete Right Result Date: 06/19/2023 CLINICAL DATA:  Postoperative problems. EXAM: RIGHT FOOT - 2 VIEW; RIGHT ANKLE - COMPLETE 3+ VIEW COMPARISON:  Right ankle 05/15/2023 FINDINGS: Two views of the right foot and three views of the right ankle are obtained. Postoperative changes with intramedullary rod extending from the calcaneus through the talus and into the distal tibia. Two screws fix the proximal aspect of the rod and 2 screws fix the calcaneus. Old pin tracts in the distal fibula and tibia. Old healed and healing fracture deformities of the distal fibular shaft and medial malleolus. Mildly displaced fracture deformity of the posterior tibial malleolus. Appearance of surgical hardware and fracture fragments is similar to prior study. No new acute changes are demonstrated. Degenerative changes in the ankle joint and intertarsal joints. Degenerative changes in the first metatarsal-phalangeal joint. Soft tissue swelling over the dorsum of the foot. Vascular calcifications. IMPRESSION: Postoperative and posttraumatic changes in the right foot and ankle as described. No significant change since previous study. Diffuse degenerative changes. Soft tissue swelling over the dorsum of the foot. Electronically Signed   By: Boyce Byes M.D.   On: 06/19/2023 20:58   DG Foot 2 Views Right Result Date: 06/19/2023 CLINICAL DATA:  Postoperative problems. EXAM: RIGHT FOOT - 2 VIEW; RIGHT ANKLE - COMPLETE 3+ VIEW COMPARISON:  Right ankle 05/15/2023 FINDINGS: Two views of the right foot and three views of the right ankle are obtained. Postoperative changes with intramedullary rod extending from the calcaneus through the talus and into the distal tibia. Two screws fix the proximal aspect of the rod and 2 screws fix the calcaneus. Old pin tracts in the distal fibula and tibia. Old  healed and healing fracture deformities of the distal fibular shaft and medial malleolus. Mildly displaced fracture deformity of the posterior tibial malleolus. Appearance of surgical hardware and fracture fragments is similar to prior study. No new acute changes are demonstrated. Degenerative changes in the ankle joint and intertarsal joints. Degenerative changes in the first metatarsal-phalangeal joint. Soft tissue swelling over the dorsum of the foot. Vascular calcifications. IMPRESSION: Postoperative and posttraumatic changes in the right foot and ankle as described. No significant change since previous study. Diffuse degenerative changes. Soft tissue swelling over the dorsum of the foot. Electronically Signed   By: Boyce Byes M.D.   On: 06/19/2023 20:58    Pertinent labs & imaging results that were available during my care of the patient were reviewed by me and considered in my medical decision making (see MDM for details).  Medications Ordered in ED Medications  cefTRIAXone  (ROCEPHIN ) 2 g in sodium chloride  0.9 % 100 mL IVPB (has no administration in time range)    And  metroNIDAZOLE  (FLAGYL ) IVPB 500 mg (has no administration in time range)  vancomycin  (VANCOREADY) IVPB 2000 mg/400 mL (has no administration in time range)  HYDROmorphone  (DILAUDID ) injection 0.5 mg (0.5 mg Intravenous Given 06/19/23 2147)  Procedures Procedures  (including critical care time)  Medical Decision Making / ED Course   MDM:  68 year old presenting to the emergency department with ankle issue.  Patient has had numerous previous surgeries complicated by infected hardware.  Lately has developed wounds to the posterior ankle as well as increasing swelling, bleeding and some redness and pain.  Does seem concerning for possible infection, will obtain x-rays, blood cultures,  labs including ESR and CRP.  May need admission for further management given his complicated history.  Will reassess.  Clinical Course as of 06/19/23 2259  Thu Jun 19, 2023  2245 Discussed with Dr. Silver Dross.  He is covering for Dr. Curtiss Dowdy, he discussed with Dr. Curtiss Dowdy and they would like the patient to be admitted for evaluation in the morning to discuss further surgical treatment of his wound including screw removal.  Will start on antibiotics.  Labs overall reassuring. [WS]  2256 Discussed with Dr. Carlton Chick who will admit for hardware/foot infection  [WS]    Clinical Course User Index [WS] Mordecai Applebaum, MD     Additional history obtained: -Additional history obtained from ems and spouse -External records from outside source obtained and reviewed including: Chart review including previous notes, labs, imaging, consultation notes including prior notes    Lab Tests: -I ordered, reviewed, and interpreted labs.   The pertinent results include:   Labs Reviewed  COMPREHENSIVE METABOLIC PANEL WITH GFR - Abnormal; Notable for the following components:      Result Value   Sodium 132 (*)    CO2 20 (*)    Glucose, Bld 159 (*)    Creatinine, Ser 2.18 (*)    Calcium  8.3 (*)    Total Protein 5.4 (*)    Albumin  2.3 (*)    Total Bilirubin 1.3 (*)    GFR, Estimated 32 (*)    All other components within normal limits  CBC WITH DIFFERENTIAL/PLATELET - Abnormal; Notable for the following components:   RBC 3.03 (*)    Hemoglobin 8.8 (*)    HCT 27.0 (*)    RDW 15.8 (*)    All other components within normal limits  C-REACTIVE PROTEIN - Abnormal; Notable for the following components:   CRP 5.0 (*)    All other components within normal limits  CULTURE, BLOOD (ROUTINE X 2)  CULTURE, BLOOD (ROUTINE X 2)  SEDIMENTATION RATE  I-STAT CG4 LACTIC ACID, ED    Notable for mild elevated crp. CKD, chronic anemia   EKG   EKG Interpretation Date/Time:    Ventricular Rate:    PR Interval:     QRS Duration:    QT Interval:    QTC Calculation:   R Axis:      Text Interpretation:           Imaging Studies ordered: I ordered imaging studies including XR foot On my interpretation imaging demonstrates foot hardware close to heel wound  I independently visualized and interpreted imaging. I agree with the radiologist interpretation   Medicines ordered and prescription drug management: Meds ordered this encounter  Medications   HYDROmorphone  (DILAUDID ) injection 0.5 mg   AND Linked Order Group    cefTRIAXone  (ROCEPHIN ) 2 g in sodium chloride  0.9 % 100 mL IVPB     Antibiotic Indication::   Other Indication (list below)     Other Indication::   Wound infection    metroNIDAZOLE  (FLAGYL ) IVPB 500 mg     Antibiotic Indication::   Other Indication (list below)  Other Indication::   Wound infection   DISCONTD: vancomycin  (VANCOCIN ) IVPB 1000 mg/200 mL premix    Indication::   Wound Infection   vancomycin  (VANCOREADY) IVPB 2000 mg/400 mL    Indication::   Wound Infection    -I have reviewed the patients home medicines and have made adjustments as needed   Consultations Obtained: I requested consultation with the orthopedic surgeon,  and discussed lab and imaging findings as well as pertinent plan - they recommend: admit    Cardiac Monitoring: The patient was maintained on a cardiac monitor.  I personally viewed and interpreted the cardiac monitored which showed an underlying rhythm of: NSR  Social Determinants of Health:  Diagnosis or treatment significantly limited by social determinants of health: obesity   Reevaluation: After the interventions noted above, I reevaluated the patient and found that their symptoms have improved  Co morbidities that complicate the patient evaluation  Past Medical History:  Diagnosis Date   Anemia    Anginal pain (HCC) 04/2013   Cardiac cath showed patent stents with distal LAD, circumflex-OM and RCA disease in small vessels.    Anxiety    Atrial fibrillation (HCC) 12/2021   CAD S/P percutaneous coronary angioplasty 2003, 04/2012   status post PCI to LAD, circumflex-OM 2, RCA   Carotid artery occlusion    Chronic renal insufficiency, stage II (mild)    Chronic venous insufficiency    varicosities, no reflux; dopplers 04/14/12- valvular insufficiency in the R and L GSV   Complication of anesthesia    COPD, mild (HCC)    Depression    situaltional    Diabetes mellitus    Diabetic neuropathy (HCC) 08/24/2019   Dyslipidemia associated with type 2 diabetes mellitus (HCC)    Fall at home, initial encounter 01/02/2023   GERD (gastroesophageal reflux disease)    History of cardioversion 12/2021   at [facility]   HTN (hypertension)    Hypothyroidism    Neuromuscular disorder (HCC)    neuropathy in feet   Neuropathy    notably improved following PCI with improved cardiac function   Obesity    Obesity, Class II, BMI 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    Final Clinical Impression(s) / ED Diagnoses Final diagnoses:  Hardware complicating wound infection, initial encounter [facility])     This chart was dictated using voice recognition software.  Despite best efforts to proofread,  errors can occur which can change the documentation meaning.    Mordecai Applebaum, MD 06/19/23 2259

## 2023-06-19 NOTE — Telephone Encounter (Signed)
 Spoke to patient's husband he already received mychart message and understands Dr.Harding's advice.

## 2023-06-19 NOTE — Telephone Encounter (Signed)
 Information was sent to patient and spouse on 06/19/23 through Mychart. See Mychart message.

## 2023-06-20 DIAGNOSIS — Z794 Long term (current) use of insulin: Secondary | ICD-10-CM

## 2023-06-20 DIAGNOSIS — N184 Chronic kidney disease, stage 4 (severe): Secondary | ICD-10-CM

## 2023-06-20 DIAGNOSIS — M86271 Subacute osteomyelitis, right ankle and foot: Secondary | ICD-10-CM | POA: Diagnosis not present

## 2023-06-20 DIAGNOSIS — T847XXA Infection and inflammatory reaction due to other internal orthopedic prosthetic devices, implants and grafts, initial encounter: Secondary | ICD-10-CM | POA: Diagnosis not present

## 2023-06-20 DIAGNOSIS — I272 Pulmonary hypertension, unspecified: Secondary | ICD-10-CM

## 2023-06-20 DIAGNOSIS — I739 Peripheral vascular disease, unspecified: Secondary | ICD-10-CM

## 2023-06-20 DIAGNOSIS — J449 Chronic obstructive pulmonary disease, unspecified: Secondary | ICD-10-CM

## 2023-06-20 DIAGNOSIS — L97314 Non-pressure chronic ulcer of right ankle with necrosis of bone: Secondary | ICD-10-CM | POA: Diagnosis not present

## 2023-06-20 DIAGNOSIS — K219 Gastro-esophageal reflux disease without esophagitis: Secondary | ICD-10-CM

## 2023-06-20 DIAGNOSIS — E039 Hypothyroidism, unspecified: Secondary | ICD-10-CM

## 2023-06-20 DIAGNOSIS — E66813 Obesity, class 3: Secondary | ICD-10-CM

## 2023-06-20 DIAGNOSIS — E1165 Type 2 diabetes mellitus with hyperglycemia: Secondary | ICD-10-CM

## 2023-06-20 DIAGNOSIS — I480 Paroxysmal atrial fibrillation: Secondary | ICD-10-CM

## 2023-06-20 DIAGNOSIS — I10 Essential (primary) hypertension: Secondary | ICD-10-CM

## 2023-06-20 LAB — GLUCOSE, CAPILLARY
Glucose-Capillary: 116 mg/dL — ABNORMAL HIGH (ref 70–99)
Glucose-Capillary: 130 mg/dL — ABNORMAL HIGH (ref 70–99)
Glucose-Capillary: 134 mg/dL — ABNORMAL HIGH (ref 70–99)
Glucose-Capillary: 136 mg/dL — ABNORMAL HIGH (ref 70–99)
Glucose-Capillary: 172 mg/dL — ABNORMAL HIGH (ref 70–99)

## 2023-06-20 LAB — CBC
HCT: 22.1 % — ABNORMAL LOW (ref 39.0–52.0)
Hemoglobin: 7.3 g/dL — ABNORMAL LOW (ref 13.0–17.0)
MCH: 29.1 pg (ref 26.0–34.0)
MCHC: 33 g/dL (ref 30.0–36.0)
MCV: 88 fL (ref 80.0–100.0)
Platelets: 128 10*3/uL — ABNORMAL LOW (ref 150–400)
RBC: 2.51 MIL/uL — ABNORMAL LOW (ref 4.22–5.81)
RDW: 15.6 % — ABNORMAL HIGH (ref 11.5–15.5)
WBC: 4.2 10*3/uL (ref 4.0–10.5)
nRBC: 0 % (ref 0.0–0.2)

## 2023-06-20 LAB — COMPREHENSIVE METABOLIC PANEL WITH GFR
ALT: 12 U/L (ref 0–44)
AST: 18 U/L (ref 15–41)
Albumin: 2 g/dL — ABNORMAL LOW (ref 3.5–5.0)
Alkaline Phosphatase: 94 U/L (ref 38–126)
Anion gap: 10 (ref 5–15)
BUN: 18 mg/dL (ref 8–23)
CO2: 22 mmol/L (ref 22–32)
Calcium: 8.1 mg/dL — ABNORMAL LOW (ref 8.9–10.3)
Chloride: 104 mmol/L (ref 98–111)
Creatinine, Ser: 2.14 mg/dL — ABNORMAL HIGH (ref 0.61–1.24)
GFR, Estimated: 33 mL/min — ABNORMAL LOW (ref >60)
Glucose, Bld: 149 mg/dL — ABNORMAL HIGH (ref 70–99)
Potassium: 4.1 mmol/L (ref 3.5–5.1)
Sodium: 136 mmol/L (ref 135–145)
Total Bilirubin: 0.6 mg/dL (ref 0.0–1.2)
Total Protein: 4.7 g/dL — ABNORMAL LOW (ref 6.5–8.1)

## 2023-06-20 LAB — FOLATE: Folate: 5.3 ng/mL — ABNORMAL LOW (ref >5.9)

## 2023-06-20 LAB — IRON AND TIBC
Iron: 33 ug/dL — ABNORMAL LOW (ref 45–182)
Saturation Ratios: 24 % (ref 17.9–39.5)
TIBC: 139 ug/dL — ABNORMAL LOW (ref 250–450)
UIBC: 106 ug/dL

## 2023-06-20 LAB — CREATININE, SERUM
Creatinine, Ser: 2.12 mg/dL — ABNORMAL HIGH (ref 0.61–1.24)
GFR, Estimated: 33 mL/min — ABNORMAL LOW (ref >60)

## 2023-06-20 LAB — RETICULOCYTES
Immature Retic Fract: 15.6 % (ref 2.3–15.9)
Immature Retic Fract: 5.6 % (ref 2.3–15.9)
RBC.: 0.84 MIL/uL — ABNORMAL LOW (ref 4.22–5.81)
RBC.: 2.58 MIL/uL — ABNORMAL LOW (ref 4.22–5.81)
Retic Count, Absolute: 19.4 10*3/uL (ref 19.0–186.0)
Retic Count, Absolute: 61.4 10*3/uL (ref 19.0–186.0)
Retic Ct Pct: 2.3 % (ref 0.4–3.1)
Retic Ct Pct: 2.4 % (ref 0.4–3.1)

## 2023-06-20 LAB — PREPARE RBC (CROSSMATCH)

## 2023-06-20 LAB — VITAMIN B12: Vitamin B-12: 554 pg/mL (ref 180–914)

## 2023-06-20 LAB — FERRITIN: Ferritin: 185 ng/mL (ref 24–336)

## 2023-06-20 MED ORDER — ACETAMINOPHEN 500 MG PO TABS: 1000.0000 mg | ORAL_TABLET | Freq: Once | ORAL | Status: DC

## 2023-06-20 MED ORDER — GERHARDT'S BUTT CREAM
TOPICAL_CREAM | Freq: Two times a day (BID) | CUTANEOUS | Status: DC
  Filled 2023-06-20 (×3): qty 60

## 2023-06-20 MED ORDER — MEDIHONEY WOUND/BURN DRESSING EX PSTE
1.0000 | PASTE | Freq: Every day | CUTANEOUS | Status: DC
Start: 2023-06-20 — End: 2023-06-28
  Administered 2023-06-20 – 2023-06-25 (×5): 1 via TOPICAL
  Filled 2023-06-20: qty 44

## 2023-06-20 MED ORDER — SODIUM CHLORIDE 0.9 % IV SOLN
8.0000 mg/kg | Freq: Every day | INTRAVENOUS | Status: DC
  Administered 2023-06-21 – 2023-06-28 (×8): 800 mg via INTRAVENOUS
  Filled 2023-06-20 (×9): qty 16

## 2023-06-20 MED ORDER — ACETAMINOPHEN 500 MG PO TABS: 1000.0000 mg | ORAL_TABLET | Freq: Once | ORAL | Status: AC

## 2023-06-20 MED ORDER — SODIUM CHLORIDE 0.9% IV SOLUTION: Freq: Once | INTRAVENOUS | Status: AC

## 2023-06-20 MED ORDER — SCOPOLAMINE 1 MG/3DAYS TD PT72
1.0000 | MEDICATED_PATCH | TRANSDERMAL | Status: DC
  Filled 2023-06-20: qty 1

## 2023-06-20 MED ORDER — CHLORHEXIDINE GLUCONATE CLOTH 2 % EX PADS
6.0000 | MEDICATED_PAD | Freq: Every day | CUTANEOUS | Status: AC
  Administered 2023-06-20 – 2023-06-22 (×3): 6 via TOPICAL

## 2023-06-20 MED ORDER — HYDROMORPHONE HCL 1 MG/ML IJ SOLN
0.5000 mg | INTRAMUSCULAR | Status: DC | PRN
  Administered 2023-06-20 – 2023-06-21 (×3): 0.5 mg via INTRAVENOUS
  Filled 2023-06-20 (×3): qty 1

## 2023-06-20 NOTE — H&P (View-Only) (Signed)
 Orthopaedic Trauma Progress Note  SUBJECTIVE: Patient is s/p right ankle/subtalar arthrodesis for trimalleolar ankle fracture on 05/15/2023.  Patient has failed to follow-up with orthopedics since discharge from the hospital.  Patient presented to [facility] emergency department on 06/19/2023 due to concern for wound on the right heel with exposed orthopedic hardware.  Patient admitted to the medicine service for antibiotics and wound evaluation.  Following previous hospitalization, patient was discharged home with home health.  Per infectious disease, was to complete 4 weeks of ciprofloxacin  (end date 06/16/2023).  Patient has been seen by home health nursing multiple times since discharge.  Per the patient and home health RN report, patient has had issues with hypotension and seizure-like activity when standing.  Has been extremely difficult for him to mobilize.  Due to lack of mobility, has not been able to present to OTS clinic for follow-up.  He did have a telemedicine visit with the infectious disease team (Dr. Gillian Lacrosse) on 06/17/2023 and was restarted on his ciprofloxacin .  Patient seen this AM on 5N.  Has neuropathy through RLE at baseline.  Notes no significant pain to the right lower extremity currently.  Denies any fever or chills.  No nausea or vomiting. Has been tolerating diet and fluids at home. Has been weightbearing for transfers on the right leg but not attempted any walker ambulation with HH PT.   OBJECTIVE:  Vitals:   06/20/23 0235 06/20/23 0805  BP: (!) 102/51 (!) 114/58  Pulse: (!) 59 (!) 58  Resp: 19 15  Temp: (!) 97.5 F (36.4 C) 98.3 F (36.8 C)  SpO2: 97% 98%    General: Sitting in bed, no acute distress.  Pleasant and cooperative Respiratory: No increased work of breathing.  Right lower extremity: Posterior heel wound with exposed screw head. Medial ankle incisions haled with exception of small area of hypergranulation over anterior portion of incision. Lateral wound  stable.   Exposed hardware over the posterior heel.  Decreased sensation to light touch of the foot and ankle due to baseline neuropathy. Sensation is intact to pinch. Endorses sensation to the proximal tibia, knee, thigh.  Able to wiggle toes. + DP pulse  IMAGING: 3 views of the right foot/ankle show hindfoot fusion nail in place extending from the calcaneus through the talus and into the distal tibia.  Proximal screws stable.  Posterior malleolus fracture with increased displacement from postoperative imaging on 05/15/2023.  One of the screws in the calcaneus appears to have loosened from previous imaging.  Soft tissue swelling noted over the foot.  LABS:  Results for orders placed or performed during the hospital encounter of 06/19/23 (from the past 24 hours)  Comprehensive metabolic panel     Status: Abnormal   Collection Time: 06/19/23  8:23 PM  Result Value Ref Range   Sodium 132 (L) 135 - 145 mmol/L   Potassium 4.4 3.5 - 5.1 mmol/L   Chloride 101 98 - 111 mmol/L   CO2 20 (L) 22 - 32 mmol/L   Glucose, Bld 159 (H) 70 - 99 mg/dL   BUN 17 8 - 23 mg/dL   Creatinine, Ser 4.09 (H) 0.61 - 1.24 mg/dL   Calcium  8.3 (L) 8.9 - 10.3 mg/dL   Total Protein 5.4 (L) 6.5 - 8.1 g/dL   Albumin  2.3 (L) 3.5 - 5.0 g/dL   AST 34 15 - 41 U/L   ALT 9 0 - 44 U/L   Alkaline Phosphatase 111 38 - 126 U/L   Total Bilirubin 1.3 (H)  0.0 - 1.2 mg/dL   GFR, Estimated 32 (L) >60 mL/min   Anion gap 11 5 - 15  CBC with Differential     Status: Abnormal   Collection Time: 06/19/23  8:23 PM  Result Value Ref Range   WBC 4.9 4.0 - 10.5 K/uL   RBC 3.03 (L) 4.22 - 5.81 MIL/uL   Hemoglobin 8.8 (L) 13.0 - 17.0 g/dL   HCT 16.1 (L) 09.6 - 04.5 %   MCV 89.1 80.0 - 100.0 fL   MCH 29.0 26.0 - 34.0 pg   MCHC 32.6 30.0 - 36.0 g/dL   RDW 40.9 (H) 81.1 - 91.4 %   Platelets 158 150 - 400 K/uL   nRBC 0.0 0.0 - 0.2 %   Neutrophils Relative % 60 %   Neutro Abs 2.9 1.7 - 7.7 K/uL   Lymphocytes Relative 26 %   Lymphs Abs 1.3 0.7  - 4.0 K/uL   Monocytes Relative 9 %   Monocytes Absolute 0.4 0.1 - 1.0 K/uL   Eosinophils Relative 4 %   Eosinophils Absolute 0.2 0.0 - 0.5 K/uL   Basophils Relative 1 %   Basophils Absolute 0.1 0.0 - 0.1 K/uL   Immature Granulocytes 0 %   Abs Immature Granulocytes 0.02 0.00 - 0.07 K/uL  Culture, blood (routine x 2)     Status: None (Preliminary result)   Collection Time: 06/19/23  8:23 PM   Specimen: BLOOD  Result Value Ref Range   Specimen Description BLOOD LEFT ANTECUBITAL    Special Requests      BOTTLES DRAWN AEROBIC AND ANAEROBIC Blood Culture results may not be optimal due to an inadequate volume of blood received in culture bottles   Culture      NO GROWTH < 12 HOURS Performed at [facility], [address] [place], [place] [address]    Report Status PENDING   Sedimentation rate     Status: Abnormal   Collection Time: 06/19/23  8:23 PM  Result Value Ref Range   Sed Rate 85 (H) 0 - 16 mm/hr  C-reactive protein     Status: Abnormal   Collection Time: 06/19/23  8:23 PM  Result Value Ref Range   CRP 5.0 (H) <1.0 mg/dL  I-Stat Lactic Acid     Status: None   Collection Time: 06/19/23 10:06 PM  Result Value Ref Range   Lactic Acid, Venous 1.8 0.5 - 1.9 mmol/L  Glucose, capillary     Status: Abnormal   Collection Time: 06/20/23 12:06 AM  Result Value Ref Range   Glucose-Capillary 130 (H) 70 - 99 mg/dL  Culture, blood (routine x 2)     Status: None (Preliminary result)   Collection Time: 06/20/23  1:14 AM   Specimen: BLOOD LEFT HAND  Result Value Ref Range   Specimen Description BLOOD LEFT HAND    Special Requests      BOTTLES DRAWN AEROBIC AND ANAEROBIC Blood Culture adequate volume   Culture      NO GROWTH < 12 HOURS Performed at [facility], [address] [place], [place] [address]    Report Status PENDING   CBC     Status: Abnormal   Collection Time: 06/20/23  1:14 AM  Result Value Ref Range   WBC 4.2 4.0 - 10.5 K/uL   RBC 2.51 (L) 4.22 - 5.81  MIL/uL   Hemoglobin 7.3 (L) 13.0 - 17.0 g/dL   HCT 86.5 (L) 78.4 - 69.6 %   MCV 88.0  80.0 - 100.0 fL   MCH 29.1 26.0 - 34.0 pg   MCHC 33.0 30.0 - 36.0 g/dL   RDW 65.7 (H) 84.6 - 96.2 %   Platelets 128 (L) 150 - 400 K/uL   nRBC 0.0 0.0 - 0.2 %  Creatinine, serum     Status: Abnormal   Collection Time: 06/20/23  1:14 AM  Result Value Ref Range   Creatinine, Ser 2.12 (H) 0.61 - 1.24 mg/dL   GFR, Estimated 33 (L) >60 mL/min  Comprehensive metabolic panel     Status: Abnormal   Collection Time: 06/20/23  1:15 AM  Result Value Ref Range   Sodium 136 135 - 145 mmol/L   Potassium 4.1 3.5 - 5.1 mmol/L   Chloride 104 98 - 111 mmol/L   CO2 22 22 - 32 mmol/L   Glucose, Bld 149 (H) 70 - 99 mg/dL   BUN 18 8 - 23 mg/dL   Creatinine, Ser 9.52 (H) 0.61 - 1.24 mg/dL   Calcium  8.1 (L) 8.9 - 10.3 mg/dL   Total Protein 4.7 (L) 6.5 - 8.1 g/dL   Albumin  2.0 (L) 3.5 - 5.0 g/dL   AST 18 15 - 41 U/L   ALT 12 0 - 44 U/L   Alkaline Phosphatase 94 38 - 126 U/L   Total Bilirubin 0.6 0.0 - 1.2 mg/dL   GFR, Estimated 33 (L) >60 mL/min   Anion gap 10 5 - 15  Glucose, capillary     Status: Abnormal   Collection Time: 06/20/23  6:23 AM  Result Value Ref Range   Glucose-Capillary 134 (H) 70 - 99 mg/dL   *Note: Due to a large number of results and/or encounters for the requested time period, some results have not been displayed. A complete set of results can be found in Results Review.    ASSESSMENT: SAGAN Harold [name] is a 68 y.o. male s/p right ankle/subtalar arthrodesis on 05/15/2023.  Now presenting with exposed hardware.  CV/Blood loss: Hemoglobin 7.3 this morning.  BP soft.  PLAN: Weightbearing: WB RLE for transfers only. Do not want any walker ambulation RLE ROM: Okay for knee motion as tolerated. Incisional and dressing care: Change as needed Showering: Hold off on showering Orthopedic device(s): None Pain management:  1. Tylenol  650 mg q 6 hours PRN 2. Robaxin  500 mg q 6 hours PRN 3.  Oxycodone  5 mg q 4 hours PRN 4. Dilaudid  0.5 mg q 2 hours PRN 5. Neurontin  300 mg nightly VTE prophylaxis: Lovenox  currently. SCDs ID: Ciprofloxacin , cefepime , vancomycin  Foley/Lines:  No foley, KVO IVFs Impediments to Fracture Healing: Vitamin D  level 28 when checked February 2025, continue supplementation  Dispo: Patient with exposed hardware to right lower extremity.  Ultimately this screw will need to be removed.  Did discuss hardware removal at bedside versus in the OR.  Patient has elected to proceed with hardware removal in the OR.  Will plan to proceed with this tomorrow morning 06/21/2023.  Please keep patient n.p.o. after midnight.  Will contact ID team to alert them patient is in the hospital, will have them evaluate during this hospitalization. Will plan to restart home dose Eliquis  and Plavix  post op  Follow - up plan: To be determined   Contact information:  Katheryne Pane MD, Alona Jamaica PA-C. After hours and holidays please check [email or web address] for group call information for [facility]   Edilia Gordon, PA-C [phone] (office) [email or web address]

## 2023-06-20 NOTE — Consult Note (Signed)
 WOC Nurse Consult Note: Reason for Consult: sacral wounds  Wound type: 1. Unstageable Pressure Injury R sacrum/buttock 2.  Stage 2 Pressure Injury L buttock  Pressure Injury POA: Yes Measurement: see nursing flowsheet  Wound bed: Sacrum/R buttock 100% dark purple eschar/L buttock pink and moist  Drainage (amount, consistency, odor) see nursing flowsheet  Periwound: erythema  Dressing procedure/placement/frequency: Cleanse  sacrum/B buttocks with Vashe wound cleanser, do not rinse and allow to air dry. Apply Medihoney to wound beds daily, cover with dry gauze.  Apply a thin layer of Gerhardt's Butt Cream  to INTACT surrounding skin of buttocks/sacrum. Cover entire area with silicone foam or ABD pad whichever is preferred.   POC discussed with bedside nurse. WOc team will not follow. Re-consult if further needs arise.   Thank you,    Ronni Colace MSN, RN-BC, [employer] [phone]

## 2023-06-20 NOTE — Progress Notes (Signed)
 Patient is s/p R hindfoot fusion nail. He presented to the emergency department 06/19/23 for exposed hardware from the calcaneus. Orthopedic progress note to follow. Please keep patient NPO in the event surgical intervention is required today.

## 2023-06-20 NOTE — Progress Notes (Signed)
 CSW received call from pt husband [name] requesting that he be part of decision making for this pt. Reports pt has been more confused and unable to share information from his conversations with the MD.  [name] is POA, copy on file.  MD notified. Baldo Bonds, MSW, LCSW 5/2/20253:36 PM

## 2023-06-20 NOTE — Plan of Care (Signed)

## 2023-06-20 NOTE — Progress Notes (Signed)
 Orthopaedic Trauma Progress Note  SUBJECTIVE: Patient is s/p right ankle/subtalar arthrodesis for trimalleolar ankle fracture on 05/15/2023.  Patient has failed to follow-up with orthopedics since discharge from the hospital.  Patient presented to [facility] emergency department on 06/19/2023 due to concern for wound on the right heel with exposed orthopedic hardware.  Patient admitted to the medicine service for antibiotics and wound evaluation.  Following previous hospitalization, patient was discharged home with home health.  Per infectious disease, was to complete 4 weeks of ciprofloxacin  (end date 06/16/2023).  Patient has been seen by home health nursing multiple times since discharge.  Per the patient and home health RN report, patient has had issues with hypotension and seizure-like activity when standing.  Has been extremely difficult for him to mobilize.  Due to lack of mobility, has not been able to present to OTS clinic for follow-up.  He did have a telemedicine visit with the infectious disease team (Dr. Gillian Lacrosse) on 06/17/2023 and was restarted on his ciprofloxacin .  Patient seen this AM on [facility].  Has neuropathy through RLE at baseline.  Notes no significant pain to the right lower extremity currently.  Denies any fever or chills.  No nausea or vomiting. Has been tolerating diet and fluids at home. Has been weightbearing for transfers on the right leg but not attempted any walker ambulation with HH PT.   OBJECTIVE:  Vitals:   06/20/23 0235 06/20/23 0805  BP: (!) 102/51 (!) 114/58  Pulse: (!) 59 (!) 58  Resp: 19 15  Temp: (!) 97.5 F (36.4 C) 98.3 F (36.8 C)  SpO2: 97% 98%    General: Sitting in bed, no acute distress.  Pleasant and cooperative Respiratory: No increased work of breathing.  Right lower extremity: Posterior heel wound with exposed screw head. Medial ankle incisions haled with exception of small area of hypergranulation over anterior portion of incision. Lateral wound  stable.   Exposed hardware over the posterior heel.  Decreased sensation to light touch of the foot and ankle due to baseline neuropathy. Sensation is intact to pinch. Endorses sensation to the proximal tibia, knee, thigh.  Able to wiggle toes. + DP pulse  IMAGING: 3 views of the right foot/ankle show hindfoot fusion nail in place extending from the calcaneus through the talus and into the distal tibia.  Proximal screws stable.  Posterior malleolus fracture with increased displacement from postoperative imaging on 05/15/2023.  One of the screws in the calcaneus appears to have loosened from previous imaging.  Soft tissue swelling noted over the foot.  LABS:  Results for orders placed or performed during the hospital encounter of 06/19/23 (from the past 24 hours)  Comprehensive metabolic panel     Status: Abnormal   Collection Time: 06/19/23  8:23 PM  Result Value Ref Range   Sodium 132 (L) 135 - 145 mmol/L   Potassium 4.4 3.5 - 5.1 mmol/L   Chloride 101 98 - 111 mmol/L   CO2 20 (L) 22 - 32 mmol/L   Glucose, Bld 159 (H) 70 - 99 mg/dL   BUN 17 8 - 23 mg/dL   Creatinine, Ser 4.09 (H) 0.61 - 1.24 mg/dL   Calcium  8.3 (L) 8.9 - 10.3 mg/dL   Total Protein 5.4 (L) 6.5 - 8.1 g/dL   Albumin  2.3 (L) 3.5 - 5.0 g/dL   AST 34 15 - 41 U/L   ALT 9 0 - 44 U/L   Alkaline Phosphatase 111 38 - 126 U/L   Total Bilirubin 1.3 (H)  0.0 - 1.2 mg/dL   GFR, Estimated 32 (L) >60 mL/min   Anion gap 11 5 - 15  CBC with Differential     Status: Abnormal   Collection Time: 06/19/23  8:23 PM  Result Value Ref Range   WBC 4.9 4.0 - 10.5 K/uL   RBC 3.03 (L) 4.22 - 5.81 MIL/uL   Hemoglobin 8.8 (L) 13.0 - 17.0 g/dL   HCT 16.1 (L) 09.6 - 04.5 %   MCV 89.1 80.0 - 100.0 fL   MCH 29.0 26.0 - 34.0 pg   MCHC 32.6 30.0 - 36.0 g/dL   RDW 40.9 (H) 81.1 - 91.4 %   Platelets 158 150 - 400 K/uL   nRBC 0.0 0.0 - 0.2 %   Neutrophils Relative % 60 %   Neutro Abs 2.9 1.7 - 7.7 K/uL   Lymphocytes Relative 26 %   Lymphs Abs 1.3 0.7  - 4.0 K/uL   Monocytes Relative 9 %   Monocytes Absolute 0.4 0.1 - 1.0 K/uL   Eosinophils Relative 4 %   Eosinophils Absolute 0.2 0.0 - 0.5 K/uL   Basophils Relative 1 %   Basophils Absolute 0.1 0.0 - 0.1 K/uL   Immature Granulocytes 0 %   Abs Immature Granulocytes 0.02 0.00 - 0.07 K/uL  Culture, blood (routine x 2)     Status: None (Preliminary result)   Collection Time: 06/19/23  8:23 PM   Specimen: BLOOD  Result Value Ref Range   Specimen Description BLOOD LEFT ANTECUBITAL    Special Requests      BOTTLES DRAWN AEROBIC AND ANAEROBIC Blood Culture results may not be optimal due to an inadequate volume of blood received in culture bottles   Culture      NO GROWTH < 12 HOURS Performed at [facility], [address] [place], [place] [address]    Report Status PENDING   Sedimentation rate     Status: Abnormal   Collection Time: 06/19/23  8:23 PM  Result Value Ref Range   Sed Rate 85 (H) 0 - 16 mm/hr  C-reactive protein     Status: Abnormal   Collection Time: 06/19/23  8:23 PM  Result Value Ref Range   CRP 5.0 (H) <1.0 mg/dL  I-Stat Lactic Acid     Status: None   Collection Time: 06/19/23 10:06 PM  Result Value Ref Range   Lactic Acid, Venous 1.8 0.5 - 1.9 mmol/L  Glucose, capillary     Status: Abnormal   Collection Time: 06/20/23 12:06 AM  Result Value Ref Range   Glucose-Capillary 130 (H) 70 - 99 mg/dL  Culture, blood (routine x 2)     Status: None (Preliminary result)   Collection Time: 06/20/23  1:14 AM   Specimen: BLOOD LEFT HAND  Result Value Ref Range   Specimen Description BLOOD LEFT HAND    Special Requests      BOTTLES DRAWN AEROBIC AND ANAEROBIC Blood Culture adequate volume   Culture      NO GROWTH < 12 HOURS Performed at [facility], [address] [place], [place] [address]    Report Status PENDING   CBC     Status: Abnormal   Collection Time: 06/20/23  1:14 AM  Result Value Ref Range   WBC 4.2 4.0 - 10.5 K/uL   RBC 2.51 (L) 4.22 - 5.81  MIL/uL   Hemoglobin 7.3 (L) 13.0 - 17.0 g/dL   HCT 86.5 (L) 78.4 - 69.6 %   MCV 88.0  80.0 - 100.0 fL   MCH 29.1 26.0 - 34.0 pg   MCHC 33.0 30.0 - 36.0 g/dL   RDW 65.7 (H) 84.6 - 96.2 %   Platelets 128 (L) 150 - 400 K/uL   nRBC 0.0 0.0 - 0.2 %  Creatinine, serum     Status: Abnormal   Collection Time: 06/20/23  1:14 AM  Result Value Ref Range   Creatinine, Ser 2.12 (H) 0.61 - 1.24 mg/dL   GFR, Estimated 33 (L) >60 mL/min  Comprehensive metabolic panel     Status: Abnormal   Collection Time: 06/20/23  1:15 AM  Result Value Ref Range   Sodium 136 135 - 145 mmol/L   Potassium 4.1 3.5 - 5.1 mmol/L   Chloride 104 98 - 111 mmol/L   CO2 22 22 - 32 mmol/L   Glucose, Bld 149 (H) 70 - 99 mg/dL   BUN 18 8 - 23 mg/dL   Creatinine, Ser 9.52 (H) 0.61 - 1.24 mg/dL   Calcium  8.1 (L) 8.9 - 10.3 mg/dL   Total Protein 4.7 (L) 6.5 - 8.1 g/dL   Albumin  2.0 (L) 3.5 - 5.0 g/dL   AST 18 15 - 41 U/L   ALT 12 0 - 44 U/L   Alkaline Phosphatase 94 38 - 126 U/L   Total Bilirubin 0.6 0.0 - 1.2 mg/dL   GFR, Estimated 33 (L) >60 mL/min   Anion gap 10 5 - 15  Glucose, capillary     Status: Abnormal   Collection Time: 06/20/23  6:23 AM  Result Value Ref Range   Glucose-Capillary 134 (H) 70 - 99 mg/dL   *Note: Due to a large number of results and/or encounters for the requested time period, some results have not been displayed. A complete set of results can be found in Results Review.    ASSESSMENT: Harold [name] is a 68 y.o. male s/p right ankle/subtalar arthrodesis on 05/15/2023.  Now presenting with exposed hardware.  CV/Blood loss: Hemoglobin 7.3 this morning.  BP soft.  PLAN: Weightbearing: WB RLE for transfers only. Do not want any walker ambulation RLE ROM: Okay for knee motion as tolerated. Incisional and dressing care: Change as needed Showering: Hold off on showering Orthopedic device(s): None Pain management:  1. Tylenol  650 mg q 6 hours PRN 2. Robaxin  500 mg q 6 hours PRN 3.  Oxycodone  5 mg q 4 hours PRN 4. Dilaudid  0.5 mg q 2 hours PRN 5. Neurontin  300 mg nightly VTE prophylaxis: Lovenox  currently. SCDs ID: Ciprofloxacin , cefepime , vancomycin  Foley/Lines:  No foley, KVO IVFs Impediments to Fracture Healing: Vitamin D  level 28 when checked February 2025, continue supplementation  Dispo: Patient with exposed hardware to right lower extremity.  Ultimately this screw will need to be removed.  Did discuss hardware removal at bedside versus in the OR.  Patient has elected to proceed with hardware removal in the OR.  Will plan to proceed with this tomorrow morning 06/21/2023.  Please keep patient n.p.o. after midnight.  Will contact ID team to alert them patient is in the hospital, will have them evaluate during this hospitalization. Will plan to restart home dose Eliquis  and Plavix  post op  Follow - up plan: To be determined   Contact information:  Katheryne Pane MD, Alona Jamaica PA-C. After hours and holidays please check [email or web address] for group call information for [facility]   Edilia Gordon, PA-C [phone] (office) [email or web address]

## 2023-06-20 NOTE — Plan of Care (Signed)

## 2023-06-20 NOTE — Consult Note (Signed)
 [facility]    Date of Admission:  06/19/2023     Total days of antibiotics                Reason for Consult: Right ankle infection complicated with hardware   Referring Provider: Dr. Mae Schlossman / Alona Jamaica PA-C Primary Care Provider: Amin, Saad, MD   ASSESSMENT:  Harold [name] is a 68 year old Caucasian gentleman with traumatic ankle fracture in November 2024 requiring hardware placement and complicated by need for multiple surgeries and infection with Enterobacter Cloacae bacteremia with suspected source of the right ankle now admitted with exposed hardware and concern for further infection. Has been taking Ciprofloxacin . Antibiotics have been changed to daptomycin  and cefepime . Awaiting surgical removal of nail tomorrow 06/21/23. Request cultures to be obtained at the time of surgery if able to help guide antibiotic therapy although has been on ciprofloxacin  which may reduce the yield. Two new pressure wounds on the sacrum and left buttock appear with evidence of infection. Therapeutic monitoring of renal function and CK levels. Optimize nutritional intake for healing. Standard/universal precautions. Remaining medical and supportive care per Internal Medicine.   PLAN:  Continue current dose of daptomycin  and cefepime . Therapeutic drug monitoring of renal function and CK levels. Monitor blood cultures for bacteremia. Surgical intervention planned for 06/21/2023 Encourage protein for healing.  Standard/universal precautions. Wound care per wound RN recommendations. Remaining medical and supportive care per internal medicine.   Principal Problem:   Hardware complicating wound infection (HCC) Active Problems:   Essential hypertension   CAD, LAD/CFX DES 04/28/12- staged RCA DES 04/29/12 after pt declined CABG, cath 05/14/13 stable CAD medical therapy   OSA on CPAP   Pulmonary hypertension,   Chronic renal disease, stage IV (HCC)   Obesity, Class  III, BMI 40-49.9 (morbid obesity)   COPD (chronic obstructive pulmonary disease) (HCC)   Restrictive lung disease   Chronic heart failure with preserved ejection fraction (HFpEF) (HCC)   Gastroesophageal reflux disease without esophagitis   Paroxysmal atrial fibrillation (HCC)   Uncontrolled type 2 diabetes mellitus with hyperglycemia, with long-term current use of insulin  (HCC)   Acquired hypothyroidism   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, right (HCC)   PAD (peripheral artery disease) (HCC)   Hyponatremia    sodium chloride    Intravenous Once   allopurinol   50 mg Oral Daily   amiodarone   200 mg Oral BID   DULoxetine   60 mg Oral BID   enoxaparin  (LOVENOX ) injection  40 mg Subcutaneous Daily   ezetimibe   10 mg Oral Daily   ferrous sulfate   325 mg Oral Once per day on Monday Wednesday Friday   furosemide   40 mg Oral Daily   gabapentin   300 mg Oral QHS   Gerhardt's butt cream   Topical BID   insulin  aspart  0-20 Units Subcutaneous TID WC   insulin  aspart  0-5 Units Subcutaneous QHS   insulin  glargine-yfgn  10 Units Subcutaneous QHS   leptospermum manuka honey  1 Application Topical Daily   levothyroxine   88 mcg Oral QAC breakfast   loratadine   10 mg Oral Daily   magnesium  oxide  400 mg Oral Daily   multivitamin with minerals  1 tablet Oral Daily   pantoprazole   40 mg Oral Daily   tamsulosin   0.4 mg Oral Daily     HPI: Harold [name] is a 68 y.o. male with previous medical history of diabetes complicated by diabetic neuropathy, coronary artery  disease status post PCI, chronic kidney disease stage IV, chronic venous insufficiency, COPD obstructive sleep apnea, and diabetic foot ulcer status postdebridement presenting concern for infection.  Harold [name] is known to the ID service. Initially sustained a fracture in November 2024 following a mechanical fall and underwent I&D and ORIF of the right ankle on 01/02/2023.  Completed 3 days of IV ceftriaxone  per  orthopedics.  Seen in follow-up in found to have pins protruding out into the joint and one of them had broken off and pushed deep into the tissues.  Brought to the OR for removal of hardware of the right ankle on 01/28/2023.  No evidence of infection at that time.  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  Again brought to the OR on 04/02/2023 for excisional debridement of the right ankle with excision of soft tissue and partial excision of the lateral border of the fibula.  Surgical specimens grew Staphylococcus epidermidis and Enterobacter cloacae.  Initial plan for 6 weeks of daptomycin  with cefepime  through 05/12/2023.  Course complicated by acute interstitial nephritis with concern for cefepime  as the source.  Antibiotics were stopped on 04/18/2023.  Readmitted to the hospital on 05/12/2023 for right ankle pain following a fall sustaining a open ankle fracture dislocation through his previous fracture.  Underwent external fixation of the right ankle with irrigation and debridement on 05/12/2023 and on 05/15/2023 underwent right ankle arthrodesis, right subtalar arthrodesis and removal of external fixation.  On 05/16/2023 noted to have Enterobacter cloacae bacteremia.  Plan was to continue ciprofloxacin  for 4 weeks from date of negative blood cultures on 3/31 with end of treatment on 06/16/2023.  Last been seen on 06/17/2023 by Dr. Gillian Lacrosse in the setting of complicated right ankle wound associated with hardware complicated by Enterobacter cloacae bacteremia and treated with ciprofloxacin .  There was concern for new new sores that developed in the right ankle over the past week prior to visit.  Course of ciprofloxacin  was extended for an additional 2 weeks with recommendations to follow-up with  orthopedics and continue wound care.  Now returns to the hospital with home health nurse noting new wounds and concern for possible exposed hardware with increased redness.  Afebrile with no leukocytosis on arrival.  Right ankle/foot x-ray with postoperative and posttraumatic changes in the right foot and ankle and no significant change since previous study along with soft tissue swelling over the dorsum of the foot.  Received vancomycin , cefepime , metronidazole .  Antibiotics changed to daptomycin  and cefepime  following consultation with ID provider on-call.  Orthopedics consulted and noted exposed hardware to the right lower extremity with need to have screw removed and elected to have this done in the operating room.  Scheduled for 06/21/2023.  Blood cultures drawn on admission on 06/20/2023 are without growth in less than 12 hours.  Wound ostomy nurse consulted for evaluation of sacral wound found to have unstageable pressure injury to the right sacrum/buttocks and a stage II pressure injury to the left buttocks.  Wound care recommendations provided.   Review of Systems: Review of Systems  Constitutional:  Negative for chills, fever and weight loss.  Respiratory:  Negative for cough, shortness of breath and wheezing.   Cardiovascular:  Negative for chest pain and leg swelling.  Gastrointestinal:  Negative for abdominal pain, constipation, diarrhea, nausea and vomiting.  Musculoskeletal:        Positive for ankle pain  Skin:  Negative for rash.     Past Medical History:  Diagnosis Date   Anemia    Anginal pain (HCC) 04/2013   Cardiac cath showed patent stents with distal LAD, circumflex-OM and RCA disease in small vessels.   Anxiety    Atrial fibrillation (HCC) 12/2021   CAD S/P percutaneous coronary angioplasty 2003, 04/2012   status post PCI to LAD, circumflex-OM 2, RCA   Carotid artery occlusion    Chronic renal insufficiency, stage II (mild)    Chronic venous insufficiency     varicosities, no reflux; dopplers 04/14/12- valvular insufficiency in the R and L GSV   Complication of anesthesia    COPD, mild (HCC)    Depression    situaltional    Diabetes mellitus    Diabetic neuropathy (HCC) 08/24/2019   Dyslipidemia associated with type 2 diabetes mellitus (HCC)    Fall at home, initial encounter 01/02/2023   GERD (gastroesophageal reflux disease)    History of cardioversion 12/2021   at [facility]   HTN (hypertension)    Hypothyroidism    Neuromuscular disorder (HCC)    neuropathy in feet   Neuropathy    notably improved following PCI with improved cardiac function   Obesity    Obesity, Class II, BMI 35.0-39.9, with comorbidity (see actual BMI)    BMI 39; wgt loss efforts in place; seeing Dietician   Open ankle fracture 01/02/2023   Orthostatic hypotension    PONV (postoperative nausea and vomiting)    "Patch Works"   Pulmonary hypertension (HCC) 04/2012   PA pressure   Sleep apnea    uses nightly   Vitamin D  deficiency    per facility notes    Social History   Tobacco Use   Smoking status: Former    Current packs/day: 0.00    Average packs/day: 1 pack/day for 42.0 years (42.0 ttl pk-yrs)    Types: Cigarettes    Start date: 03/22/1961    Quit date: 03/23/2003    Years since quitting: 20.2   Smokeless tobacco: Never   Tobacco comments:    Former smoker 02/19/22  Vaping Use   Vaping status: Never Used  Substance Use Topics   Alcohol use: No    Alcohol/week: 0.0 standard drinks of alcohol   Drug use: No    Family History  Adopted: Yes  Family history unknown: Yes    Allergies  Allergen Reactions   Doxycycline  Hives and Rash   Septra [Bactrim] Itching   Penicillins Rash    Allergy to All cillin drugs  Ancef  given 9/24 with no obvious reaction   Metformin  And Related Diarrhea and Nausea Only   Other Nausea And Vomiting    General Anesthesia - sometimes causes nausea and vomiting * OK to use scopolamine  patch      Sulfamethoxazole-Trimethoprim Other (See Comments)   Wellbutrin [Bupropion]     Mood Changes    OBJECTIVE: Blood pressure (!) 114/58, pulse (!) 58, temperature 98.3 F (36.8 C), temperature source Oral, resp. rate 15, height 6\' 1"  (1.854 m), weight 124.5 kg, SpO2 98%.  Physical Exam Constitutional:      General: He is not in acute distress.    Appearance: He is well-developed.  Cardiovascular:     Rate and Rhythm: Normal rate and regular rhythm.     Heart sounds: Normal heart sounds.  Pulmonary:     Effort: Pulmonary effort is normal.  Breath sounds: Normal breath sounds.  Musculoskeletal:     Comments: Dressing in place with no shadowing. Pictures reviewed.   Skin:    General: Skin is warm and dry.  Neurological:     Mental Status: He is alert.     Lab Results Lab Results  Component Value Date   WBC 4.2 06/20/2023   HGB 7.3 (L) 06/20/2023   HCT 22.1 (L) 06/20/2023   MCV 88.0 06/20/2023   PLT 128 (L) 06/20/2023    Lab Results  Component Value Date   CREATININE 2.14 (H) 06/20/2023   BUN 18 06/20/2023   NA 136 06/20/2023   K 4.1 06/20/2023   CL 104 06/20/2023   CO2 22 06/20/2023    Lab Results  Component Value Date   ALT 12 06/20/2023   AST 18 06/20/2023   GGT 28 02/20/2018   ALKPHOS 94 06/20/2023   BILITOT 0.6 06/20/2023     Microbiology: Recent Results (from the past 240 hours)  Culture, blood (routine x 2)     Status: None (Preliminary result)   Collection Time: 06/19/23  8:23 PM   Specimen: BLOOD  Result Value Ref Range Status   Specimen Description BLOOD LEFT ANTECUBITAL  Final   Special Requests   Final    BOTTLES DRAWN AEROBIC AND ANAEROBIC Blood Culture results may not be optimal due to an inadequate volume of blood received in culture bottles   Culture   Final    NO GROWTH < 12 HOURS Performed at [facility], [address] [place], [place] [address]    Report Status PENDING  Incomplete  Culture, blood (routine x 2)     Status:  None (Preliminary result)   Collection Time: 06/20/23  1:14 AM   Specimen: BLOOD LEFT HAND  Result Value Ref Range Status   Specimen Description BLOOD LEFT HAND  Final   Special Requests   Final    BOTTLES DRAWN AEROBIC AND ANAEROBIC Blood Culture adequate volume   Culture   Final    NO GROWTH < 12 HOURS Performed at [facility], [address] [place], [place] [address]    Report Status PENDING  Incomplete     Marlan Silva, NP [facility]  06/20/2023  11:17 AM

## 2023-06-20 NOTE — ED Notes (Signed)
 Call was placed to [facility] alerting them that PT was coming up.

## 2023-06-20 NOTE — Progress Notes (Signed)
 PROGRESS NOTE  Harold [name]:[id number] DOB: [birth year]   PCP: Amin, Saad, MD  Patient is from: Home.  Lives with significant other.  DOA: 06/19/2023 LOS: 1  Chief complaints Chief Complaint  Patient presents with   Post-op Problem     Brief Narrative / Interim history: 68 year old M with PMH of CAD/PCI, CKD-4, COPD, PAH, OSA, DM with neuropathy, chronic venous insufficiency, HTN, HLD and diabetic foot ulcer, traumatic right ankle fracture in 12/2022 requiring hardware placement complicated by need for multiple surgeries and infection with Enterobacter cloacae bacteremia and recent right ankle arthrodesis on 3/27 presenting with pain, bleeding and exposed hardware.  No report of fever or constitutional symptoms.  He was on ciprofloxacin  outpatient.  In ED, stable vitals.  No leukocytosis.  CRP 5.0. Cr 2.18 (above baseline).  Hgb 8.8 (above baseline).  Right ankle x-ray showed postoperative changes with surgical hardware unchanged from prior, mildly displaced fracture deformity of the posterior tibial malleolus, degenerative changes in the ankle joint and first MPJ and soft tissue swelling over the dorsum of the foot.  Blood cultures obtained.  Started on broad-spectrum antibiotics.  Orthopedic surgery consulted   The next day, blood cultures NGTD.  Hgb dropped to 7.3.  Transfused 1 unit.  Subjective: Seen and examined earlier this morning.  No major events overnight of this morning.  No complaints other than pain.  He rates his pain 6/10.  Objective: Vitals:   06/20/23 0004 06/20/23 0235 06/20/23 0805 06/20/23 1257  BP: (!) 143/64 (!) 102/51 (!) 114/58 (!) 142/88  Pulse: (!) 58 (!) 59 (!) 58 60  Resp: 19 19 15 16   Temp: 98.4 F (36.9 C) (!) 97.5 F (36.4 C) 98.3 F (36.8 C) 97.8 F (36.6 C)  TempSrc: Oral Oral Oral Axillary  SpO2: 98% 97% 98% 100%  Weight:      Height:        Examination:  GENERAL: No apparent distress.  Nontoxic. HEENT: MMM.  Vision and hearing  grossly intact.  NECK: Supple.  No apparent JVD.  RESP:  No IWOB.  Fair aeration bilaterally. CVS:  RRR. Heart sounds normal.  ABD/GI/GU: BS+. Abd soft, NTND.  MSK/EXT:  Moves extremities.  Right heel ulcer with hardware shown in picture below.  SKIN: Right heel ulcer as shown in picture.  No obvious bleeding or drainage.  Surgical wound DCI. NEURO: Awake, alert and oriented appropriately.  No apparent focal neuro deficit. PSYCH: Calm. Normal affect.        Consultants:  Orthopedic surgery  Procedures: None  Microbiology summarized: Blood cultures NGTD  Assessment and plan: Right ankle hardware infection: Patient with traumatic right ankle fracture in 12/2022 requiring hardware placement complicated by need for multiple surgeries and infection with Enterobacter cloacae bacteremia and recent right ankle arthrodesis on 3/27.  Seems  to be on Cipro  outpatient. -On daptomycin  and cefepime  per ID. -Plan for hardware removal on 5/3. -ID and orthopedic surgery on board -Pain control per Ortho  IDDM-2 with hyperglycemia, neuropathy and diabetic ulcer: A1c 7.0% in 12/2022 Recent Labs  Lab 06/20/23 0006 06/20/23 0623 06/20/23 1151  GLUCAP 130* 134* 116*  -Recheck hemoglobin A1c -Continue current insulin  regimen - Continue home Cymbalta , gabapentin  and Lipitor  CKD-4: At baseline Recent Labs    05/15/23 0543 05/16/23 0520 05/17/23 0917 05/18/23 0809 05/19/23 0759 05/20/23 9562 05/21/23 0504 05/26/23 1420 06/19/23 2023 06/20/23 0114 06/20/23 0115  BUN 28* 29* 32* 30* 27* 26* 26* 33* 17  --  18  CREATININE 3.07*  3.23* 3.10* 2.83* 2.76* 2.62* 2.38* 2.47* 2.18* 2.12* 2.14*  - Continue monitoring  Paroxysmal A-fib: Rate controlled -Continue amiodarone  -Eliquis  on hold for surgery   pulmonary hypertension/OSA - CPAP at night - Minimize sedating medications   Essential hypertension: BP within acceptable range.  Not on meds. -blood pressure appears controlled.   Continue to monitor.  Anemia of renal disease: Baseline Hgb about 8.0.  Slight drop in Hgb. Recent Labs    05/15/23 0543 05/16/23 0520 05/17/23 0441 05/18/23 0809 05/19/23 0759 05/20/23 0614 05/26/23 1420 05/26/23 1427 06/19/23 2023 06/20/23 0114  HGB 7.6* 8.3* 8.0* 7.7* 7.5* 8.6* 10.0* 9.5* 8.8* 7.3*  -Transfuse 1 unit -Check anemia panel    PAD: On Plavix  and Lipitor - Continue home Lipitor - Hold Plavix    Hypothyroidism:  -Continue levothyroxine   BPH - Continue home Flomax   GERD - Continue PPI  History of gout - Continue allopurinol    Class II obesity Body mass index is 36.21 kg/m.  Pressure skin injury: Present on arrival Pressure Injury 06/20/23 Sacrum Right Unstageable - Full thickness tissue loss in which the base of the injury is covered by slough (yellow, tan, gray, green or brown) and/or eschar (tan, brown or black) in the wound bed. (Active)  06/20/23 0050  Location: Sacrum  Location Orientation: Right  Staging: Unstageable - Full thickness tissue loss in which the base of the injury is covered by slough (yellow, tan, gray, green or brown) and/or eschar (tan, brown or black) in the wound bed.  Wound Description (Comments):   Present on Admission: Yes  Dressing Type Foam - Lift dressing to assess site every shift 06/20/23 0358   DVT prophylaxis:  enoxaparin  (LOVENOX ) injection 40 mg Start: 06/20/23 1000 SCDs Start: 06/19/23 2358  Code Status: Full code Family Communication: None at bedside Level of care: Med-Surg Status is: Inpatient Remains inpatient appropriate because: Right ankle hardware infection   Final disposition: To be determined   55 minutes with more than 50% spent in reviewing records, counseling patient/family and coordinating care.   Sch Meds:  Scheduled Meds:  acetaminophen   1,000 mg Oral Once   allopurinol   50 mg Oral Daily   amiodarone   200 mg Oral BID   DULoxetine   60 mg Oral BID   enoxaparin  (LOVENOX ) injection   40 mg Subcutaneous Daily   ezetimibe   10 mg Oral Daily   ferrous sulfate   325 mg Oral Once per day on Monday Wednesday Friday   furosemide   40 mg Oral Daily   gabapentin   300 mg Oral QHS   Gerhardt's butt cream   Topical BID   insulin  aspart  0-20 Units Subcutaneous TID WC   insulin  aspart  0-5 Units Subcutaneous QHS   insulin  glargine-yfgn  10 Units Subcutaneous QHS   leptospermum manuka honey  1 Application Topical Daily   levothyroxine   88 mcg Oral QAC breakfast   loratadine   10 mg Oral Daily   magnesium  oxide  400 mg Oral Daily   multivitamin with minerals  1 tablet Oral Daily   pantoprazole   40 mg Oral Daily   scopolamine   1 patch Transdermal Q72H   tamsulosin   0.4 mg Oral Daily   Continuous Infusions:  sodium chloride  100 mL/hr at 06/20/23 0034   ceFEPime  (MAXIPIME ) IV 2 g (06/20/23 1138)   [START ON 06/21/2023] DAPTOmycin      PRN Meds:.acetaminophen  **OR** acetaminophen , HYDROmorphone  (DILAUDID ) injection, methocarbamol , nitroGLYCERIN , ondansetron  **OR** ondansetron  (ZOFRAN ) IV, oxyCODONE   Antimicrobials: Anti-infectives (From admission, onward)    Start  Dose/Rate Route Frequency Ordered Stop   06/21/23 2200  DAPTOmycin  (CUBICIN ) 800 mg in sodium chloride  0.9 % IVPB        8 mg/kg  97.7 kg (Adjusted) 132 mL/hr over 30 Minutes Intravenous Daily 06/20/23 1005     06/20/23 0045  ciprofloxacin  (CIPRO ) tablet 500 mg  Status:  Discontinued        500 mg Oral 2 times daily 06/19/23 2358 06/20/23 1001   06/19/23 2316  vancomycin  (VANCOREADY) IVPB 2000 mg/400 mL  Status:  Discontinued        2,000 mg 200 mL/hr over 120 Minutes Intravenous Every 48 hours 06/19/23 2316 06/20/23 1005   06/19/23 2315  ceFEPIme  (MAXIPIME ) 2 g in sodium chloride  0.9 % 100 mL IVPB        2 g 200 mL/hr over 30 Minutes Intravenous Every 12 hours 06/19/23 2311     06/19/23 2245  cefTRIAXone  (ROCEPHIN ) 2 g in sodium chloride  0.9 % 100 mL IVPB  Status:  Discontinued       Placed in "And" Linked Group    2 g 200 mL/hr over 30 Minutes Intravenous Once 06/19/23 2241 06/19/23 2311   06/19/23 2245  metroNIDAZOLE  (FLAGYL ) IVPB 500 mg       Placed in "And" Linked Group   500 mg 100 mL/hr over 60 Minutes Intravenous  Once 06/19/23 2241 06/20/23 0014   06/19/23 2245  vancomycin  (VANCOCIN ) IVPB 1000 mg/200 mL premix  Status:  Discontinued       Placed in "And" Linked Group   1,000 mg 200 mL/hr over 60 Minutes Intravenous  Once 06/19/23 2241 06/19/23 2243   06/19/23 2245  vancomycin  (VANCOREADY) IVPB 2000 mg/400 mL  Status:  Discontinued        2,000 mg 200 mL/hr over 120 Minutes Intravenous  Once 06/19/23 2244 06/19/23 2316        I have personally reviewed the following labs and images: CBC: Recent Labs  Lab 06/19/23 2023 06/20/23 0114  WBC 4.9 4.2  NEUTROABS 2.9  --   HGB 8.8* 7.3*  HCT 27.0* 22.1*  MCV 89.1 88.0  PLT 158 128*   BMP &GFR Recent Labs  Lab 06/19/23 2023 06/20/23 0114 06/20/23 0115  NA 132*  --  136  K 4.4  --  4.1  CL 101  --  104  CO2 20*  --  22  GLUCOSE 159*  --  149*  BUN 17  --  18  CREATININE 2.18* 2.12* 2.14*  CALCIUM  8.3*  --  8.1*   Estimated Creatinine Clearance: 45 mL/min (A) (by C-G formula based on SCr of 2.14 mg/dL (H)). Liver & Pancreas: Recent Labs  Lab 06/19/23 2023 06/20/23 0115  AST 34 18  ALT 9 12  ALKPHOS 111 94  BILITOT 1.3* 0.6  PROT 5.4* 4.7*  ALBUMIN  2.3* 2.0*   No results for input(s): "LIPASE", "AMYLASE" in the last 168 hours. No results for input(s): "AMMONIA" in the last 168 hours. Diabetic: No results for input(s): "HGBA1C" in the last 72 hours. Recent Labs  Lab 06/20/23 0006 06/20/23 0623 06/20/23 1151  GLUCAP 130* 134* 116*   Cardiac Enzymes: No results for input(s): "CKTOTAL", "CKMB", "CKMBINDEX", "TROPONINI" in the last 168 hours. No results for input(s): "PROBNP" in the last 8760 hours. Coagulation Profile: No results for input(s): "INR", "PROTIME" in the last 168 hours. Thyroid  Function Tests: No  results for input(s): "TSH", "T4TOTAL", "FREET4", "T3FREE", "THYROIDAB" in the last 72 hours. Lipid Profile: No results for input(s): "  CHOL", "HDL", "LDLCALC", "TRIG", "CHOLHDL", "LDLDIRECT" in the last 72 hours. Anemia Panel: Recent Labs    06/20/23 0856  VITAMINB12 554  FOLATE 5.3*  FERRITIN 185  TIBC 139*  IRON 33*  RETICCTPCT 2.3   Urine analysis:    Component Value Date/Time   COLORURINE YELLOW 04/15/2023 1732   APPEARANCEUR CLOUDY (A) 04/15/2023 1732   LABSPEC 1.014 04/15/2023 1732   PHURINE 5.0 04/15/2023 1732   GLUCOSEU >=500 (A) 04/15/2023 1732   HGBUR MODERATE (A) 04/15/2023 1732   BILIRUBINUR NEGATIVE 04/15/2023 1732   BILIRUBINUR negative 05/12/2018 1213   BILIRUBINUR small 06/29/2014 0859   KETONESUR NEGATIVE 04/15/2023 1732   PROTEINUR >=300 (A) 04/15/2023 1732   UROBILINOGEN 0.2 05/12/2018 1213   UROBILINOGEN 1.0 05/02/2014 2015   NITRITE NEGATIVE 04/15/2023 1732   LEUKOCYTESUR NEGATIVE 04/15/2023 1732   Sepsis Labs: Invalid input(s): "PROCALCITONIN", "LACTICIDVEN"  Microbiology: Recent Results (from the past 240 hours)  Culture, blood (routine x 2)     Status: None (Preliminary result)   Collection Time: 06/19/23  8:23 PM   Specimen: BLOOD  Result Value Ref Range Status   Specimen Description BLOOD LEFT ANTECUBITAL  Final   Special Requests   Final    BOTTLES DRAWN AEROBIC AND ANAEROBIC Blood Culture results may not be optimal due to an inadequate volume of blood received in culture bottles   Culture   Final    NO GROWTH < 12 HOURS Performed at [facility], [address] [place], [place] [address]    Report Status PENDING  Incomplete  Culture, blood (routine x 2)     Status: None (Preliminary result)   Collection Time: 06/20/23  1:14 AM   Specimen: BLOOD LEFT HAND  Result Value Ref Range Status   Specimen Description BLOOD LEFT HAND  Final   Special Requests   Final    BOTTLES DRAWN AEROBIC AND ANAEROBIC Blood Culture adequate volume    Culture   Final    NO GROWTH < 12 HOURS Performed at [facility], [address], [place], [place] [address]    Report Status PENDING  Incomplete    Radiology Studies: DG Ankle Complete Right Result Date: 06/19/2023 CLINICAL DATA:  Postoperative problems. EXAM: RIGHT FOOT - 2 VIEW; RIGHT ANKLE - COMPLETE 3+ VIEW COMPARISON:  Right ankle 05/15/2023 FINDINGS: Two views of the right foot and three views of the right ankle are obtained. Postoperative changes with intramedullary rod extending from the calcaneus through the talus and into the distal tibia. Two screws fix the proximal aspect of the rod and 2 screws fix the calcaneus. Old pin tracts in the distal fibula and tibia. Old healed and healing fracture deformities of the distal fibular shaft and medial malleolus. Mildly displaced fracture deformity of the posterior tibial malleolus. Appearance of surgical hardware and fracture fragments is similar to prior study. No new acute changes are demonstrated. Degenerative changes in the ankle joint and intertarsal joints. Degenerative changes in the first metatarsal-phalangeal joint. Soft tissue swelling over the dorsum of the foot. Vascular calcifications. IMPRESSION: Postoperative and posttraumatic changes in the right foot and ankle as described. No significant change since previous study. Diffuse degenerative changes. Soft tissue swelling over the dorsum of the foot. Electronically Signed   By: Boyce Byes M.D.   On: 06/19/2023 20:58   DG Foot 2 Views Right Result Date: 06/19/2023 CLINICAL DATA:  Postoperative problems. EXAM: RIGHT FOOT - 2 VIEW; RIGHT ANKLE - COMPLETE 3+ VIEW COMPARISON:  Right  ankle 05/15/2023 FINDINGS: Two views of the right foot and three views of the right ankle are obtained. Postoperative changes with intramedullary rod extending from the calcaneus through the talus and into the distal tibia. Two screws fix the proximal aspect of the rod and 2 screws fix the calcaneus. Old  pin tracts in the distal fibula and tibia. Old healed and healing fracture deformities of the distal fibular shaft and medial malleolus. Mildly displaced fracture deformity of the posterior tibial malleolus. Appearance of surgical hardware and fracture fragments is similar to prior study. No new acute changes are demonstrated. Degenerative changes in the ankle joint and intertarsal joints. Degenerative changes in the first metatarsal-phalangeal joint. Soft tissue swelling over the dorsum of the foot. Vascular calcifications. IMPRESSION: Postoperative and posttraumatic changes in the right foot and ankle as described. No significant change since previous study. Diffuse degenerative changes. Soft tissue swelling over the dorsum of the foot. Electronically Signed   By: Boyce Byes M.D.   On: 06/19/2023 20:58      Annel Zunker [employer] [facility]  If 7PM-7AM, please contact night-coverage [email or web address] 06/20/2023, 1:10 PM

## 2023-06-20 NOTE — Plan of Care (Signed)
 Updated patient's significant other over the phone.  He has multiple questions including the nature of surgery/procedure, his antibiotics and orthostatic hypotension.  He stated that patient had renal failure requiring dialysis when he was on daptomycin  and cefepime  in the past.  He would like to have an update from providers involved in his care. He says he is POA and has paperwork in the chart. I have told him about the planned surgery for "hardware removal" but cannot speak about the details of the surgery.  In regards to his concern about antibiotics and renal failure, it is rare to have AKI with daptomycin  and cefepime  but we are closely monitoring his renal function, and will make adjustments as necessary.

## 2023-06-20 NOTE — Anesthesia Preprocedure Evaluation (Signed)
 Anesthesia Evaluation  Patient identified by MRN, date of birth, ID band Patient awake    Reviewed: Allergy & Precautions, NPO status , Patient's Chart, lab work & pertinent test results  History of Anesthesia Complications (+) PONV and history of anesthetic complications  Airway Mallampati: III  TM Distance: >3 FB Neck ROM: Full    Dental  (+) Dental Advisory Given   Pulmonary sleep apnea and Continuous Positive Airway Pressure Ventilation , COPD, former smoker   Pulmonary exam normal        Cardiovascular hypertension, Pt. on medications pulmonary hypertension+ CAD, + Cardiac Stents and + Peripheral Vascular Disease  Normal cardiovascular exam+ dysrhythmias Atrial Fibrillation    '25 TTE - EF 55 to 60%. The left ventricle demonstrates regional wall motion abnormalities with basal inferior hypokinesis. The left ventricular internal cavity size was mildly dilated. Grade II diastolic dysfunction (pseudonormalization). The right ventricular size is mildly enlarged. There is mildly elevated pulmonary artery systolic pressure. The estimated right ventricular systolic pressure is 38.3 mmHg. Left atrial size was moderately dilated. Right atrial size was mildly dilated. Trivial MR.    Neuro/Psych  PSYCHIATRIC DISORDERS Anxiety Depression     Neuromuscular disease    GI/Hepatic Neg liver ROS,GERD  Medicated and Controlled,,  Endo/Other  diabetes, Type 2, Insulin  DependentHypothyroidism   Obesity   Renal/GU CRFRenal disease     Musculoskeletal negative musculoskeletal ROS (+)    Abdominal   Peds  Hematology  (+) Blood dyscrasia, anemia  On eliquis  and plavix      Anesthesia Other Findings On GLP-1a with recent administration and active bloating   Reproductive/Obstetrics                             Anesthesia Physical Anesthesia Plan  ASA: 3  Anesthesia Plan: General   Post-op Pain Management:  Tylenol  PO (pre-op)*   Induction: Intravenous and Rapid sequence  PONV Risk Score and Plan: 3 and Treatment may vary due to age or medical condition, Ondansetron , Dexamethasone  and Scopolamine  patch - Pre-op  Airway Management Planned: Oral ETT  Additional Equipment: None  Intra-op Plan:   Post-operative Plan: Extubation in OR  Informed Consent: I have reviewed the patients History and Physical, chart, labs and discussed the procedure including the risks, benefits and alternatives for the proposed anesthesia with the patient or authorized representative who has indicated his/her understanding and acceptance.     Dental advisory given  Plan Discussed with: CRNA and Anesthesiologist  Anesthesia Plan Comments:         Anesthesia Quick Evaluation

## 2023-06-21 ENCOUNTER — Other Ambulatory Visit: Payer: Self-pay

## 2023-06-21 ENCOUNTER — Inpatient Hospital Stay (HOSPITAL_COMMUNITY): Admitting: Anesthesiology

## 2023-06-21 ENCOUNTER — Encounter (HOSPITAL_COMMUNITY): Payer: Self-pay | Admitting: Internal Medicine

## 2023-06-21 ENCOUNTER — Inpatient Hospital Stay (HOSPITAL_COMMUNITY)

## 2023-06-21 ENCOUNTER — Encounter (HOSPITAL_COMMUNITY)
Admission: EM | Disposition: A | Discharge status: Skilled Nursing Facility | Payer: Self-pay | Source: Home / Self Care | Attending: Student

## 2023-06-21 DIAGNOSIS — I272 Pulmonary hypertension, unspecified: Secondary | ICD-10-CM | POA: Diagnosis not present

## 2023-06-21 DIAGNOSIS — J449 Chronic obstructive pulmonary disease, unspecified: Secondary | ICD-10-CM | POA: Diagnosis not present

## 2023-06-21 DIAGNOSIS — I2510 Atherosclerotic heart disease of native coronary artery without angina pectoris: Secondary | ICD-10-CM | POA: Diagnosis not present

## 2023-06-21 DIAGNOSIS — T847XXA Infection and inflammatory reaction due to other internal orthopedic prosthetic devices, implants and grafts, initial encounter: Secondary | ICD-10-CM | POA: Diagnosis not present

## 2023-06-21 DIAGNOSIS — I5032 Chronic diastolic (congestive) heart failure: Secondary | ICD-10-CM

## 2023-06-21 DIAGNOSIS — G4733 Obstructive sleep apnea (adult) (pediatric): Secondary | ICD-10-CM

## 2023-06-21 DIAGNOSIS — T84098A Other mechanical complication of other internal joint prosthesis, initial encounter: Secondary | ICD-10-CM | POA: Diagnosis not present

## 2023-06-21 DIAGNOSIS — I739 Peripheral vascular disease, unspecified: Secondary | ICD-10-CM | POA: Diagnosis not present

## 2023-06-21 DIAGNOSIS — I4891 Unspecified atrial fibrillation: Secondary | ICD-10-CM

## 2023-06-21 DIAGNOSIS — J984 Other disorders of lung: Secondary | ICD-10-CM

## 2023-06-21 DIAGNOSIS — E871 Hypo-osmolality and hyponatremia: Secondary | ICD-10-CM

## 2023-06-21 DIAGNOSIS — Z9861 Coronary angioplasty status: Secondary | ICD-10-CM

## 2023-06-21 DIAGNOSIS — T84418A Breakdown (mechanical) of other internal orthopedic devices, implants and grafts, initial encounter: Secondary | ICD-10-CM | POA: Diagnosis not present

## 2023-06-21 HISTORY — PX: HARDWARE REMOVAL: SHX979

## 2023-06-21 LAB — TYPE AND SCREEN
ABO/RH(D): A POS
Antibody Screen: NEGATIVE
Unit division: 0
Unit division: 0

## 2023-06-21 LAB — RENAL FUNCTION PANEL
Albumin: 2 g/dL — ABNORMAL LOW (ref 3.5–5.0)
Anion gap: 7 (ref 5–15)
BUN: 15 mg/dL (ref 8–23)
CO2: 22 mmol/L (ref 22–32)
Calcium: 8.2 mg/dL — ABNORMAL LOW (ref 8.9–10.3)
Chloride: 108 mmol/L (ref 98–111)
Creatinine, Ser: 2.05 mg/dL — ABNORMAL HIGH (ref 0.61–1.24)
GFR, Estimated: 34 mL/min — ABNORMAL LOW (ref >60)
Glucose, Bld: 133 mg/dL — ABNORMAL HIGH (ref 70–99)
Phosphorus: 3.1 mg/dL (ref 2.5–4.6)
Potassium: 3.8 mmol/L (ref 3.5–5.1)
Sodium: 137 mmol/L (ref 135–145)

## 2023-06-21 LAB — CBC
HCT: 26.7 % — ABNORMAL LOW (ref 39.0–52.0)
Hemoglobin: 8.7 g/dL — ABNORMAL LOW (ref 13.0–17.0)
MCH: 28.8 pg (ref 26.0–34.0)
MCHC: 32.6 g/dL (ref 30.0–36.0)
MCV: 88.4 fL (ref 80.0–100.0)
Platelets: 135 10*3/uL — ABNORMAL LOW (ref 150–400)
RBC: 3.02 MIL/uL — ABNORMAL LOW (ref 4.22–5.81)
RDW: 15.5 % (ref 11.5–15.5)
WBC: 4.8 10*3/uL (ref 4.0–10.5)
nRBC: 0 % (ref 0.0–0.2)

## 2023-06-21 LAB — GLUCOSE, CAPILLARY
Glucose-Capillary: 119 mg/dL — ABNORMAL HIGH (ref 70–99)
Glucose-Capillary: 137 mg/dL — ABNORMAL HIGH (ref 70–99)
Glucose-Capillary: 172 mg/dL — ABNORMAL HIGH (ref 70–99)
Glucose-Capillary: 288 mg/dL — ABNORMAL HIGH (ref 70–99)
Glucose-Capillary: 258 mg/dL — ABNORMAL HIGH (ref 70–99)

## 2023-06-21 LAB — SURGICAL PCR SCREEN
MRSA, PCR: NEGATIVE
Staphylococcus aureus: NEGATIVE

## 2023-06-21 LAB — BPAM RBC
Blood Product Expiration Date: 202505262359
ISSUE DATE / TIME: 202505021242
ISSUE DATE / TIME: 202505031044
ISSUE DATE / TIME: 202505302359
Unit Type and Rh: 202505262359
Unit Type and Rh: 202505302359
Unit Type and Rh: 5100
Unit Type and Rh: 6200

## 2023-06-21 LAB — MAGNESIUM: Magnesium: 1.7 mg/dL (ref 1.7–2.4)

## 2023-06-21 LAB — CK: Total CK: 36 U/L — ABNORMAL LOW (ref 49–397)

## 2023-06-21 SURGERY — REMOVAL, HARDWARE
Anesthesia: General | Site: Ankle | Laterality: Right

## 2023-06-21 MED ORDER — ONDANSETRON HCL 4 MG/2ML IJ SOLN: 4.0000 mg | Freq: Once | INTRAMUSCULAR | Status: DC | PRN

## 2023-06-21 MED ORDER — CHLORHEXIDINE GLUCONATE 0.12 % MT SOLN
OROMUCOSAL | Status: AC
  Administered 2023-06-21: 15 mL via OROMUCOSAL
  Filled 2023-06-21: qty 15

## 2023-06-21 MED ORDER — SODIUM CHLORIDE 0.9 % IV SOLN: INTRAVENOUS | Status: DC

## 2023-06-21 MED ORDER — POLYETHYLENE GLYCOL 3350 17 G PO PACK: 17.0000 g | PACK | Freq: Every day | ORAL | Status: DC | PRN

## 2023-06-21 MED ORDER — CEFAZOLIN SODIUM-DEXTROSE 2-4 GM/100ML-% IV SOLN
2.0000 g | Freq: Once | INTRAVENOUS | Status: AC
  Administered 2023-06-21: 2 g via INTRAVENOUS

## 2023-06-21 MED ORDER — CHLORHEXIDINE GLUCONATE 0.12 % MT SOLN: 15.0000 mL | Freq: Once | OROMUCOSAL | Status: AC

## 2023-06-21 MED ORDER — CEFAZOLIN SODIUM-DEXTROSE 2-4 GM/100ML-% IV SOLN
INTRAVENOUS | Status: AC
  Filled 2023-06-21: qty 100

## 2023-06-21 MED ORDER — INSULIN ASPART 100 UNIT/ML IJ SOLN
0.0000 [IU] | Freq: Three times a day (TID) | INTRAMUSCULAR | Status: DC
  Administered 2023-06-21: 8 [IU] via SUBCUTANEOUS
  Administered 2023-06-22 (×2): 3 [IU] via SUBCUTANEOUS
  Administered 2023-06-22 – 2023-06-25 (×4): 2 [IU] via SUBCUTANEOUS
  Administered 2023-06-25: 3 [IU] via SUBCUTANEOUS
  Administered 2023-06-26 – 2023-06-27 (×4): 2 [IU] via SUBCUTANEOUS

## 2023-06-21 MED ORDER — METOCLOPRAMIDE HCL 5 MG PO TABS: 5.0000 mg | ORAL_TABLET | Freq: Three times a day (TID) | ORAL | Status: DC | PRN

## 2023-06-21 MED ORDER — INSULIN GLARGINE-YFGN 100 UNIT/ML ~~LOC~~ SOLN
15.0000 [IU] | Freq: Every day | SUBCUTANEOUS | Status: DC
  Administered 2023-06-21 – 2023-06-27 (×7): 15 [IU] via SUBCUTANEOUS
  Filled 2023-06-21 (×8): qty 0.15

## 2023-06-21 MED ORDER — LIDOCAINE 2% (20 MG/ML) 5 ML SYRINGE
INTRAMUSCULAR | Status: DC | PRN
  Administered 2023-06-21: 80 mg via INTRAVENOUS

## 2023-06-21 MED ORDER — INSULIN ASPART 100 UNIT/ML IJ SOLN: 0.0000 [IU] | INTRAMUSCULAR | Status: DC | PRN

## 2023-06-21 MED ORDER — ORAL CARE MOUTH RINSE: 15.0000 mL | Freq: Once | OROMUCOSAL | Status: AC

## 2023-06-21 MED ORDER — MIDODRINE HCL 5 MG PO TABS
5.0000 mg | ORAL_TABLET | Freq: Three times a day (TID) | ORAL | Status: DC
  Administered 2023-06-21 – 2023-06-22 (×4): 5 mg via ORAL
  Filled 2023-06-21 (×4): qty 1

## 2023-06-21 MED ORDER — FENTANYL CITRATE (PF) 100 MCG/2ML IJ SOLN: 25.0000 ug | INTRAMUSCULAR | Status: DC | PRN

## 2023-06-21 MED ORDER — FENTANYL CITRATE (PF) 250 MCG/5ML IJ SOLN
INTRAMUSCULAR | Status: DC | PRN
  Administered 2023-06-21: 50 ug via INTRAVENOUS

## 2023-06-21 MED ORDER — ROCURONIUM BROMIDE 10 MG/ML (PF) SYRINGE
PREFILLED_SYRINGE | INTRAVENOUS | Status: DC | PRN
  Administered 2023-06-21: 30 mg via INTRAVENOUS

## 2023-06-21 MED ORDER — 0.9 % SODIUM CHLORIDE (POUR BTL) OPTIME
TOPICAL | Status: DC | PRN
  Administered 2023-06-21: 1000 mL

## 2023-06-21 MED ORDER — EPHEDRINE SULFATE-NACL 50-0.9 MG/10ML-% IV SOSY
PREFILLED_SYRINGE | INTRAVENOUS | Status: DC | PRN
Start: 2023-06-21 — End: 2023-06-21
  Administered 2023-06-21 (×2): 5 mg via INTRAVENOUS

## 2023-06-21 MED ORDER — OXYCODONE HCL 5 MG/5ML PO SOLN: 5.0000 mg | Freq: Once | ORAL | Status: DC | PRN

## 2023-06-21 MED ORDER — ONDANSETRON HCL 4 MG/2ML IJ SOLN
INTRAMUSCULAR | Status: DC | PRN
  Administered 2023-06-21: 4 mg via INTRAVENOUS

## 2023-06-21 MED ORDER — DOCUSATE SODIUM 100 MG PO CAPS
100.0000 mg | ORAL_CAPSULE | Freq: Two times a day (BID) | ORAL | Status: DC
  Administered 2023-06-21 – 2023-06-28 (×8): 100 mg via ORAL
  Filled 2023-06-21 (×12): qty 1

## 2023-06-21 MED ORDER — SUCCINYLCHOLINE CHLORIDE 200 MG/10ML IV SOSY
PREFILLED_SYRINGE | INTRAVENOUS | Status: DC | PRN
  Administered 2023-06-21: 100 mg via INTRAVENOUS

## 2023-06-21 MED ORDER — SCOPOLAMINE 1 MG/3DAYS TD PT72
MEDICATED_PATCH | TRANSDERMAL | Status: AC
  Administered 2023-06-21: 1.5 mg via TRANSDERMAL
  Filled 2023-06-21: qty 1

## 2023-06-21 MED ORDER — PHENYLEPHRINE 80 MCG/ML (10ML) SYRINGE FOR IV PUSH (FOR BLOOD PRESSURE SUPPORT)
PREFILLED_SYRINGE | INTRAVENOUS | Status: DC | PRN
  Administered 2023-06-21: 160 ug via INTRAVENOUS

## 2023-06-21 MED ORDER — VITAMIN D 25 MCG (1000 UNIT) PO TABS
2000.0000 [IU] | ORAL_TABLET | Freq: Every day | ORAL | Status: DC
  Administered 2023-06-21 – 2023-06-28 (×8): 2000 [IU] via ORAL
  Filled 2023-06-21 (×8): qty 2

## 2023-06-21 MED ORDER — ACETAMINOPHEN 500 MG PO TABS
ORAL_TABLET | ORAL | Status: AC
  Administered 2023-06-21: 1000 mg via ORAL
  Filled 2023-06-21: qty 2

## 2023-06-21 MED ORDER — DEXAMETHASONE SODIUM PHOSPHATE 10 MG/ML IJ SOLN
INTRAMUSCULAR | Status: DC | PRN
  Administered 2023-06-21: 4 mg via INTRAVENOUS

## 2023-06-21 MED ORDER — METOCLOPRAMIDE HCL 5 MG/ML IJ SOLN: 5.0000 mg | Freq: Three times a day (TID) | INTRAMUSCULAR | Status: DC | PRN

## 2023-06-21 MED ORDER — SUGAMMADEX SODIUM 200 MG/2ML IV SOLN
INTRAVENOUS | Status: DC | PRN
  Administered 2023-06-21: 200 mg via INTRAVENOUS

## 2023-06-21 MED ORDER — PROPOFOL 10 MG/ML IV BOLUS
INTRAVENOUS | Status: AC
  Filled 2023-06-21: qty 20

## 2023-06-21 MED ORDER — OXYCODONE HCL 5 MG PO TABS: 5.0000 mg | ORAL_TABLET | Freq: Once | ORAL | Status: DC | PRN

## 2023-06-21 MED ORDER — FENTANYL CITRATE (PF) 250 MCG/5ML IJ SOLN
INTRAMUSCULAR | Status: AC
  Filled 2023-06-21: qty 5

## 2023-06-21 MED ORDER — PROPOFOL 10 MG/ML IV BOLUS
INTRAVENOUS | Status: DC | PRN
  Administered 2023-06-21: 150 mg via INTRAVENOUS

## 2023-06-21 MED ORDER — ATORVASTATIN CALCIUM 40 MG PO TABS
40.0000 mg | ORAL_TABLET | Freq: Every day | ORAL | Status: DC
Start: 2023-06-21 — End: 2023-06-28
  Administered 2023-06-21 – 2023-06-27 (×7): 40 mg via ORAL
  Filled 2023-06-21 (×7): qty 1

## 2023-06-21 SURGICAL SUPPLY — 53 items
BAG COUNTER SPONGE SURGICOUNT (BAG) ×1 IMPLANT
BANDAGE ESMARK 6X9 LF (GAUZE/BANDAGES/DRESSINGS) ×1 IMPLANT
BNDG COHESIVE 4X5 WHT NS (GAUZE/BANDAGES/DRESSINGS) IMPLANT
BNDG COHESIVE 6X5 TAN ST LF (GAUZE/BANDAGES/DRESSINGS) ×1 IMPLANT
BNDG ELASTIC 4X5.8 VLCR STR LF (GAUZE/BANDAGES/DRESSINGS) ×1 IMPLANT
BNDG ELASTIC 6INX 5YD STR LF (GAUZE/BANDAGES/DRESSINGS) ×1 IMPLANT
BNDG GAUZE DERMACEA FLUFF 4 (GAUZE/BANDAGES/DRESSINGS) ×2 IMPLANT
BRUSH SCRUB EZ PLAIN DRY (MISCELLANEOUS) ×2 IMPLANT
CHLORAPREP W/TINT 26 (MISCELLANEOUS) ×1 IMPLANT
COVER SURGICAL LIGHT HANDLE (MISCELLANEOUS) ×2 IMPLANT
CUFF TOURN SGL QUICK 18X4 (TOURNIQUET CUFF) IMPLANT
CUFF TRNQT CYL 24X4X16.5-23 (TOURNIQUET CUFF) IMPLANT
CUFF TRNQT CYL 34X4.125X (TOURNIQUET CUFF) IMPLANT
DRAPE C-ARM 42X72 X-RAY (DRAPES) IMPLANT
DRAPE C-ARMOR (DRAPES) ×1 IMPLANT
DRAPE U-SHAPE 47X51 STRL (DRAPES) ×1 IMPLANT
DRSG ADAPTIC 3X8 NADH LF (GAUZE/BANDAGES/DRESSINGS) ×1 IMPLANT
DRSG MEPITEL 4X7.2 (GAUZE/BANDAGES/DRESSINGS) IMPLANT
ELECTRODE REM PT RTRN 9FT ADLT (ELECTROSURGICAL) ×1 IMPLANT
GAUZE SPONGE 4X4 12PLY STRL (GAUZE/BANDAGES/DRESSINGS) ×1 IMPLANT
GLOVE BIO SURGEON STRL SZ 6.5 (GLOVE) ×3 IMPLANT
GLOVE BIO SURGEON STRL SZ7.5 (GLOVE) ×4 IMPLANT
GLOVE BIOGEL PI IND STRL 6.5 (GLOVE) ×1 IMPLANT
GLOVE BIOGEL PI IND STRL 7.5 (GLOVE) ×1 IMPLANT
GOWN STRL REUS W/ TWL LRG LVL3 (GOWN DISPOSABLE) ×2 IMPLANT
KIT BASIN OR (CUSTOM PROCEDURE TRAY) ×1 IMPLANT
KIT TURNOVER KIT B (KITS) ×1 IMPLANT
MANIFOLD NEPTUNE II (INSTRUMENTS) ×1 IMPLANT
NDL 22X1.5 STRL (OR ONLY) (MISCELLANEOUS) IMPLANT
NEEDLE 22X1.5 STRL (OR ONLY) (MISCELLANEOUS) IMPLANT
NS IRRIG 1000ML POUR BTL (IV SOLUTION) ×1 IMPLANT
PACK ORTHO EXTREMITY (CUSTOM PROCEDURE TRAY) ×1 IMPLANT
PAD ARMBOARD POSITIONER FOAM (MISCELLANEOUS) ×2 IMPLANT
PADDING CAST COTTON 6X4 STRL (CAST SUPPLIES) ×3 IMPLANT
PADDING CAST SYNTHETIC 4X4 STR (CAST SUPPLIES) IMPLANT
SPONGE T-LAP 18X18 ~~LOC~~+RFID (SPONGE) ×1 IMPLANT
STAPLER VISISTAT 35W (STAPLE) IMPLANT
STOCKINETTE IMPERVIOUS LG (DRAPES) ×1 IMPLANT
STRIP CLOSURE SKIN 1/2X4 (GAUZE/BANDAGES/DRESSINGS) IMPLANT
SUCTION TUBE FRAZIER 10FR DISP (SUCTIONS) IMPLANT
SUT ETHILON 3 0 PS 1 (SUTURE) IMPLANT
SUT MNCRL AB 3-0 PS2 18 (SUTURE) ×1 IMPLANT
SUT MON AB 2-0 CT1 36 (SUTURE) ×1 IMPLANT
SUT PDS AB 2-0 CT1 27 (SUTURE) IMPLANT
SUT VIC AB 0 CT1 27XBRD ANBCTR (SUTURE) IMPLANT
SUT VIC AB 2-0 CT1 TAPERPNT 27 (SUTURE) IMPLANT
SYR CONTROL 10ML LL (SYRINGE) IMPLANT
TOWEL GREEN STERILE (TOWEL DISPOSABLE) ×2 IMPLANT
TOWEL GREEN STERILE FF (TOWEL DISPOSABLE) ×2 IMPLANT
TUBE CONNECTING 12X1/4 (SUCTIONS) ×1 IMPLANT
UNDERPAD 30X36 HEAVY ABSORB (UNDERPADS AND DIAPERS) ×1 IMPLANT
WATER STERILE IRR 1000ML POUR (IV SOLUTION) ×2 IMPLANT
YANKAUER SUCT BULB TIP NO VENT (SUCTIONS) ×1 IMPLANT

## 2023-06-21 NOTE — Progress Notes (Signed)
 PROGRESS NOTE  Harold [name] MVH:[id number] DOB: 1955/11/05   PCP: Amin, Saad, MD  Patient is from: Home.  Lives with significant other.  DOA: 06/19/2023 LOS: 2  Chief complaints Chief Complaint  Patient presents with   Post-op Problem     Brief Narrative / Interim history: 68 year old M with PMH of CAD/PCI, CKD-4, COPD, PAH, OSA, DM with neuropathy, chronic venous insufficiency, orthostatic hypotension, HLD and diabetic foot ulcer, traumatic right ankle fracture in 12/2022 requiring hardware placement complicated by need for multiple surgeries and infection with Enterobacter cloacae bacteremia and recent right ankle arthrodesis on 3/27 presenting with pain, bleeding and exposed hardware.  No report of fever or constitutional symptoms.  He was on ciprofloxacin  outpatient.  In ED, stable vitals.  No leukocytosis.  CRP 5.0. Cr 2.18 (above baseline).  Hgb 8.8 (above baseline).  Right ankle x-ray showed postoperative changes with surgical hardware unchanged from prior, mildly displaced fracture deformity of the posterior tibial malleolus, degenerative changes in the ankle joint and first MPJ and soft tissue swelling over the dorsum of the foot.  Blood cultures obtained.  Started on broad-spectrum antibiotics.  Orthopedic surgery consulted   The next day, blood cultures NGTD.  Hgb dropped to 7.3.  Transfused 1 unit with appropriate response.  Underwent hardware removal of right foot on 5/3.  Subjective: Seen and examined earlier this morning after he returned from surgery.  No complaints.  Denies chest pain, shortness of breath, GI or UTI symptoms.  Pain fairly controlled.  Significant other at bedside.  Objective: Vitals:   06/21/23 0830 06/21/23 0845 06/21/23 0853 06/21/23 1316  BP: (!) 118/54 (!) 126/57 (!) 128/55 (!) 122/53  Pulse: (!) 59 (!) 58 (!) 58 65  Resp: 15 15 13 18   Temp:   97.7 F (36.5 C)   TempSrc:      SpO2: 97% 98% 98% 99%  Weight:      Height:         Examination:  GENERAL: No apparent distress.  Nontoxic. HEENT: MMM.  Vision and hearing grossly intact.  NECK: Supple.  No apparent JVD.  RESP:  No IWOB.  Fair aeration bilaterally. CVS:  RRR. Heart sounds normal.  ABD/GI/GU: BS+. Abd soft, NTND.  MSK/EXT:  Moves extremities.  Ace wrap over right foot. SKIN: Ace wrap over right foot. NEURO: Awake, alert and oriented appropriately.  No apparent focal neuro deficit. PSYCH: Calm. Normal affect.     Consultants:  Orthopedic surgery  Procedures: 5/3-right foot/ankle hardware removal by Dr. Curtiss Dowdy  Microbiology summarized: Blood cultures NGTD  Assessment and plan: Right ankle hardware infection: Patient with traumatic right ankle fracture in 12/2022 requiring hardware placement complicated by need for multiple surgeries and infection with Enterobacter cloacae bacteremia and recent right ankle arthrodesis on 3/27.  Seems  to be on Cipro  outpatient. -S/p right ankle hardware removal by Dr. Curtiss Dowdy on 5/3 -On daptomycin  and cefepime  per ID. -ID and orthopedic surgery on board -Pain control per Ortho -WBAT on RLE per Ortho -PT/OT  IDDM-2 with hyperglycemia, neuropathy and diabetic ulcer: A1c 7.0% in 12/2022 Recent Labs  Lab 06/20/23 1707 06/20/23 2058 06/21/23 0603 06/21/23 0821 06/21/23 1125  GLUCAP 172* 136* 119* 137* 172*  -Recheck hemoglobin A1c -Increase Semglee  from 10 to 15 units at night -Decreased SSI from resistant to moderate -Continue home Cymbalta , gabapentin  and Lipitor  CKD-4: At baseline Recent Labs    05/16/23 0520 05/17/23 0917 05/18/23 0809 05/19/23 0759 05/20/23 0614 05/21/23 0504 05/26/23 1420 06/19/23 2023 06/20/23  1610 06/20/23 0115 06/21/23 0633  BUN 29* 32* 30* 27* 26* 26* 33* 17  --  18 15  CREATININE 3.23* 3.10* 2.83* 2.76* 2.62* 2.38* 2.47* 2.18* 2.12* 2.14* 2.05*  - Continue monitoring  Paroxysmal A-fib: Rate controlled -Continue amiodarone  - Resume Eliquis  when okay from  surgical standpoint   pulmonary hypertension/OSA - CPAP at night - Minimize sedating medications   Orthostatic hypotension: Chronic.  BP within acceptable range.. - Restart midodrine  at 5 mg 3 times daily  Anemia of renal disease: Hgb dropped to 7.3 on 5/2.  Transfused 1 units with appropriate response.  Baseline Hgb about 8.0.  No nutritional deficiency on anemia panel Recent Labs    05/16/23 0520 05/17/23 0441 05/18/23 0809 05/19/23 0759 05/20/23 0614 05/26/23 1420 05/26/23 1427 06/19/23 2023 06/20/23 0114 06/21/23 0633  HGB 8.3* 8.0* 7.7* 7.5* 8.6* 10.0* 9.5* 8.8* 7.3* 8.7*  - Continue monitoring    PAD: On Plavix  and Lipitor - Continue home Lipitor - Resume Plavix  when okay from surgical standpoint   Hypothyroidism:  -Continue levothyroxine   BPH - Continue home Flomax   GERD - Continue PPI  History of gout -Continue allopurinol    Class II obesity Body mass index is 36.22 kg/m.  Pressure skin injury: Present on arrival Pressure Injury 06/20/23 Sacrum Right Unstageable - Full thickness tissue loss in which the base of the injury is covered by slough (yellow, tan, gray, green or brown) and/or eschar (tan, brown or black) in the wound bed. (Active)  06/20/23 0050  Location: Sacrum  Location Orientation: Right  Staging: Unstageable - Full thickness tissue loss in which the base of the injury is covered by slough (yellow, tan, gray, green or brown) and/or eschar (tan, brown or black) in the wound bed.  Wound Description (Comments):   Present on Admission: Yes  Dressing Type Foam - Lift dressing to assess site every shift 06/20/23 0358   DVT prophylaxis:  SCDs Start: 06/21/23 0914 SCDs Start: 06/19/23 2358  Code Status: Full code Family Communication: Updated significant other at bedside Level of care: Med-Surg Status is: Inpatient Remains inpatient appropriate because: Right ankle hardware infection   Final disposition: To be determined   55 minutes  with more than 50% spent in reviewing records, counseling patient/family and coordinating care.   Sch Meds:  Scheduled Meds:  allopurinol   50 mg Oral Daily   amiodarone   200 mg Oral BID   Chlorhexidine  Gluconate Cloth  6 each Topical Daily   docusate sodium   100 mg Oral BID   DULoxetine   60 mg Oral BID   ezetimibe   10 mg Oral Daily   ferrous sulfate   325 mg Oral Once per day on Monday Wednesday Friday   gabapentin   300 mg Oral QHS   Gerhardt's butt cream   Topical BID   insulin  aspart  0-20 Units Subcutaneous TID WC   insulin  aspart  0-5 Units Subcutaneous QHS   insulin  glargine-yfgn  10 Units Subcutaneous QHS   leptospermum manuka honey  1 Application Topical Daily   levothyroxine   88 mcg Oral QAC breakfast   loratadine   10 mg Oral Daily   magnesium  oxide  400 mg Oral Daily   multivitamin with minerals  1 tablet Oral Daily   pantoprazole   40 mg Oral Daily   tamsulosin   0.4 mg Oral Daily   Continuous Infusions:  sodium chloride  100 mL/hr at 06/21/23 1013   ceFEPime  (MAXIPIME ) IV 2 g (06/21/23 1343)   DAPTOmycin      PRN Meds:.acetaminophen  **OR** acetaminophen ,  HYDROmorphone  (DILAUDID ) injection, methocarbamol , metoCLOPramide  **OR** metoCLOPramide  (REGLAN ) injection, nitroGLYCERIN , oxyCODONE , polyethylene glycol  Antimicrobials: Anti-infectives (From admission, onward)    Start     Dose/Rate Route Frequency Ordered Stop   06/21/23 2200  DAPTOmycin  (CUBICIN ) 800 mg in sodium chloride  0.9 % IVPB        8 mg/kg  97.7 kg (Adjusted) 132 mL/hr over 30 Minutes Intravenous Daily 06/20/23 1005     06/21/23 0716  ceFAZolin  (ANCEF ) 2-4 GM/100ML-% IVPB       Note to Pharmacy: Darrell Else D: cabinet override      06/21/23 0716 06/21/23 0757   06/21/23 0715  ceFAZolin  (ANCEF ) IVPB 2g/100 mL premix        2 g 200 mL/hr over 30 Minutes Intravenous  Once 06/21/23 0706 06/21/23 0753   06/20/23 0045  ciprofloxacin  (CIPRO ) tablet 500 mg  Status:  Discontinued        500 mg Oral 2 times  daily 06/19/23 2358 06/20/23 1001   06/19/23 2316  vancomycin  (VANCOREADY) IVPB 2000 mg/400 mL  Status:  Discontinued        2,000 mg 200 mL/hr over 120 Minutes Intravenous Every 48 hours 06/19/23 2316 06/20/23 1005   06/19/23 2315  ceFEPIme  (MAXIPIME ) 2 g in sodium chloride  0.9 % 100 mL IVPB        2 g 200 mL/hr over 30 Minutes Intravenous Every 12 hours 06/19/23 2311     06/19/23 2245  cefTRIAXone  (ROCEPHIN ) 2 g in sodium chloride  0.9 % 100 mL IVPB  Status:  Discontinued       Placed in "And" Linked Group   2 g 200 mL/hr over 30 Minutes Intravenous Once 06/19/23 2241 06/19/23 2311   06/19/23 2245  metroNIDAZOLE  (FLAGYL ) IVPB 500 mg       Placed in "And" Linked Group   500 mg 100 mL/hr over 60 Minutes Intravenous  Once 06/19/23 2241 06/20/23 0014   06/19/23 2245  vancomycin  (VANCOCIN ) IVPB 1000 mg/200 mL premix  Status:  Discontinued       Placed in "And" Linked Group   1,000 mg 200 mL/hr over 60 Minutes Intravenous  Once 06/19/23 2241 06/19/23 2243   06/19/23 2245  vancomycin  (VANCOREADY) IVPB 2000 mg/400 mL  Status:  Discontinued        2,000 mg 200 mL/hr over 120 Minutes Intravenous  Once 06/19/23 2244 06/19/23 2316        I have personally reviewed the following labs and images: CBC: Recent Labs  Lab 06/19/23 2023 06/20/23 0114 06/21/23 0633  WBC 4.9 4.2 4.8  NEUTROABS 2.9  --   --   HGB 8.8* 7.3* 8.7*  HCT 27.0* 22.1* 26.7*  MCV 89.1 88.0 88.4  PLT 158 128* 135*   BMP &GFR Recent Labs  Lab 06/19/23 2023 06/20/23 0114 06/20/23 0115 06/21/23 0633  NA 132*  --  136 137  K 4.4  --  4.1 3.8  CL 101  --  104 108  CO2 20*  --  22 22  GLUCOSE 159*  --  149* 133*  BUN 17  --  18 15  CREATININE 2.18* 2.12* 2.14* 2.05*  CALCIUM  8.3*  --  8.1* 8.2*  MG  --   --   --  1.7  PHOS  --   --   --  3.1   Estimated Creatinine Clearance: 47 mL/min (A) (by C-G formula based on SCr of 2.05 mg/dL (H)). Liver & Pancreas: Recent Labs  Lab 06/19/23 2023 06/20/23 0115  06/21/23 0633  AST 34 18  --   ALT 9 12  --   ALKPHOS 111 94  --   BILITOT 1.3* 0.6  --   PROT 5.4* 4.7*  --   ALBUMIN  2.3* 2.0* 2.0*   No results for input(s): "LIPASE", "AMYLASE" in the last 168 hours. No results for input(s): "AMMONIA" in the last 168 hours. Diabetic: No results for input(s): "HGBA1C" in the last 72 hours. Recent Labs  Lab 06/20/23 1707 06/20/23 2058 06/21/23 0603 06/21/23 0821 06/21/23 1125  GLUCAP 172* 136* 119* 137* 172*   Cardiac Enzymes: Recent Labs  Lab 06/21/23 0633  CKTOTAL 36*   No results for input(s): "PROBNP" in the last 8760 hours. Coagulation Profile: No results for input(s): "INR", "PROTIME" in the last 168 hours. Thyroid  Function Tests: No results for input(s): "TSH", "T4TOTAL", "FREET4", "T3FREE", "THYROIDAB" in the last 72 hours. Lipid Profile: No results for input(s): "CHOL", "HDL", "LDLCALC", "TRIG", "CHOLHDL", "LDLDIRECT" in the last 72 hours. Anemia Panel: Recent Labs    06/20/23 0856 06/20/23 0900  VITAMINB12 554  --   FOLATE 5.3*  --   FERRITIN 185  --   TIBC 139*  --   IRON 33*  --   RETICCTPCT 2.3 2.4   Urine analysis:    Component Value Date/Time   COLORURINE YELLOW 04/15/2023 1732   APPEARANCEUR CLOUDY (A) 04/15/2023 1732   LABSPEC 1.014 04/15/2023 1732   PHURINE 5.0 04/15/2023 1732   GLUCOSEU >=500 (A) 04/15/2023 1732   HGBUR MODERATE (A) 04/15/2023 1732   BILIRUBINUR NEGATIVE 04/15/2023 1732   BILIRUBINUR negative 05/12/2018 1213   BILIRUBINUR small 06/29/2014 0859   KETONESUR NEGATIVE 04/15/2023 1732   PROTEINUR >=300 (A) 04/15/2023 1732   UROBILINOGEN 0.2 05/12/2018 1213   UROBILINOGEN 1.0 05/02/2014 2015   NITRITE NEGATIVE 04/15/2023 1732   LEUKOCYTESUR NEGATIVE 04/15/2023 1732   Sepsis Labs: Invalid input(s): "PROCALCITONIN", "LACTICIDVEN"  Microbiology: Recent Results (from the past 240 hours)  Culture, blood (routine x 2)     Status: None (Preliminary result)   Collection Time: 06/19/23   8:23 PM   Specimen: BLOOD  Result Value Ref Range Status   Specimen Description BLOOD LEFT ANTECUBITAL  Final   Special Requests   Final    BOTTLES DRAWN AEROBIC AND ANAEROBIC Blood Culture results may not be optimal due to an inadequate volume of blood received in culture bottles   Culture   Final    NO GROWTH 2 DAYS Performed at [facility], [address], [place], [place] [address]    Report Status PENDING  Incomplete  Culture, blood (routine x 2)     Status: None (Preliminary result)   Collection Time: 06/20/23  1:14 AM   Specimen: BLOOD LEFT HAND  Result Value Ref Range Status   Specimen Description BLOOD LEFT HAND  Final   Special Requests   Final    BOTTLES DRAWN AEROBIC AND ANAEROBIC Blood Culture adequate volume   Culture   Final    NO GROWTH 1 DAY Performed at [facility], [address] [place], [place] [address]    Report Status PENDING  Incomplete  Surgical pcr screen     Status: None   Collection Time: 06/21/23  5:53 AM   Specimen: Nasal Mucosa; Nasal Swab  Result Value Ref Range Status   MRSA, PCR NEGATIVE NEGATIVE Final   Staphylococcus aureus NEGATIVE NEGATIVE Final    Comment: (NOTE) The Xpert SA Assay (FDA approved for NASAL specimens in  patients 70 years of age and older), is one component of a comprehensive surveillance program. It is not intended to diagnose infection nor to guide or monitor treatment. Performed at [facility], [address], [place], [place] [address]     Radiology Studies: DG Ankle Complete Right Result Date: 06/21/2023 CLINICAL DATA:  Elective surgery. EXAM: RIGHT ANKLE - COMPLETE 3+ VIEW COMPARISON:  Radiograph 06/19/2023 FINDINGS: Two fluoroscopic spot views of the right ankle submitted from the operating room. Calcaneal screws have been removed. Rod traversing the calcaneus, talus and tibia remains. Fluoroscopy time 3 seconds. Dose 0.11 mGy. IMPRESSION: Intraoperative fluoroscopy during ankle surgery.  Electronically Signed   By: Chadwick Colonel M.D.   On: 06/21/2023 09:59   DG C-Arm 1-60 Min-No Report Result Date: 06/21/2023 Fluoroscopy was utilized by the requesting physician.  No radiographic interpretation.      Guillaume Weninger T. Delno Blaisdell [facility]  If 7PM-7AM, please contact night-coverage [email or web address] 06/21/2023, 2:40 PM

## 2023-06-21 NOTE — Evaluation (Signed)
 Occupational Therapy Evaluation Patient Details Name: Harold [name] MRN: [id number] DOB: 1955-06-08 Today's Date: 06/21/2023   History of Present Illness   68 y.o. male presented to the emergency department 06/19/23 for exposed hardware from the calcaneus, prior admission 05/26/2023 due to DOE and reported hypoxia after HHPT session. Pt was recently discharged on 05/22/2023 after management of R ankle fx with ORIF. PMH: HTN, HLD, CAD s/p PCI, COPD, DM2, CKD stage IIIb, PVD, gout, OSA, neuropathy, COPD, a-fib, CAD s/p percutaneous coronary angioplasty, angina, obesity     Clinical Impressions Pt c/o pain at rest, 4/10, pain meds helping. Pt lives at home with husband who is available 24/7, PLOF sleeps in recliner, stand pivot transfer to [facility] with help of husband, who assist with all ADLs and hygiene at baseline. Pt currently close to baseline but was not able to stand today due to pain and low BP. BP 116/49 supine, 97/53 sitting EOB. Pt total A for LB ADLs and toileting at bed level, not able to sit at EOB without max A to maintain sitting balance with posterior lean. Pt max A for assisting with bed mobility. Pt motivated to improve function, would benefit from postacute intensive rehab >3hrs/day to maximize functional strength and independence, will continue to see acutely to progress as able. Pt reports he got a w/c but it is too wide to fit around his house, may need to trial smaller w/c to maximize mobility in home setting, will have to find out info about width of doorways, etc.      If plan is discharge home, recommend the following:   Two people to help with walking and/or transfers;A lot of help with bathing/dressing/bathroom;Assistance with cooking/housework;Assist for transportation;Help with stairs or ramp for entrance     Functional Status Assessment   Patient has had a recent decline in their functional status and demonstrates the ability to make significant improvements in  function in a reasonable and predictable amount of time.     Equipment Recommendations   Other (comment) (defer)     Recommendations for Other Services   Rehab consult     Precautions/Restrictions   Precautions Precautions: Fall Recall of Precautions/Restrictions: Intact Restrictions Weight Bearing Restrictions Per Provider Order: Yes RLE Weight Bearing Per Provider Order: Weight bearing as tolerated     Mobility Bed Mobility Overal bed mobility: Needs Assistance Bed Mobility: Supine to Sit, Sit to Supine     Supine to sit: Max assist, +2 for physical assistance, HOB elevated, Used rails Sit to supine: Max assist, +2 for physical assistance, Used rails   General bed mobility comments: max A x2 in/out of bed,    Transfers Overall transfer level: Needs assistance                 General transfer comment: not attempted      Balance Overall balance assessment: Needs assistance Sitting-balance support: Feet supported, Bilateral upper extremity supported Sitting balance-Leahy Scale: Poor Sitting balance - Comments: posterior lean, mod-max A to maintain sitting position EOB     Standing balance-Leahy Scale: Zero Standing balance comment: did not attempt                           ADL either performed or assessed with clinical judgement   ADL Overall ADL's : Needs assistance/impaired Eating/Feeding: Independent   Grooming: Set up;Bed level   Upper Body Bathing: Moderate assistance;Bed level   Lower Body Bathing: Total assistance;Bed level  Upper Body Dressing : Moderate assistance;Bed level   Lower Body Dressing: Total assistance;Bed level       Toileting- Clothing Manipulation and Hygiene: Total assistance;Bed level         General ADL Comments: Pt bed level currently, able to sit EOB with mod-max A to maintain sitting position, posterior lean. Sleeps in recliner, stand pivot to [facility] at baseline. Unable to stand today      Vision Baseline Vision/History: 1 Wears glasses Ability to See in Adequate Light: 0 Adequate       Perception         Praxis         Pertinent Vitals/Pain Pain Assessment Pain Assessment: 0-10 Pain Score: 4  Pain Location: R foot Pain Descriptors / Indicators: Aching, Constant Pain Intervention(s): Monitored during session     Extremity/Trunk Assessment Upper Extremity Assessment Upper Extremity Assessment: Overall WFL for tasks assessed           Communication Communication Communication: No apparent difficulties   Cognition Arousal: Alert Behavior During Therapy: WFL for tasks assessed/performed Cognition: No apparent impairments                               Following commands: Intact       Cueing  General Comments   Cueing Techniques: Verbal cues      Exercises     Shoulder Instructions      Home Living Family/patient expects to be discharged to:: Private residence Living Arrangements: Spouse/significant other Available Help at Discharge: Family;Available 24 hours/day Type of Home: House Home Access: Ramped entrance     Home Layout: One level     Bathroom Shower/Tub: Tub/shower unit;Sponge bathes at baseline   Bathroom Toilet: Handicapped height Bathroom Accessibility: Yes How Accessible: Accessible via walker Home Equipment: Pharmacist, hospital (2 wheels);Transport chair;Grab bars - toilet;Grab bars - tub/shower;BSC/3in1;Wheelchair - manual   Additional Comments: Pt reports spouse works from home and can assist as needed      Prior Functioning/Environment Prior Level of Function : History of Falls (last six months);Needs assist             Mobility Comments: sleeps in recliner, using BSC. Hypertension when standing. pt with many falls, reports episodes of syncope. Since ankle fx pt had been transferring some with assistance of spouse. Was working on stand pivot transfers with HHPT ADLs Comments: Husband  helps with wiping, helps with transfers, LB dressing. sponge bathing. wheelchair won't fit through doorways. amy in-house RN comes at least 1-2x/week for 2-3 hours to change bandage and washing.    OT Problem List: Decreased strength;Decreased range of motion;Decreased activity tolerance;Impaired balance (sitting and/or standing);Pain;Obesity   OT Treatment/Interventions: Therapeutic exercise;Self-care/ADL training;Energy conservation;DME and/or AE instruction;Therapeutic activities;Patient/family education;Balance training      OT Goals(Current goals can be found in the care plan section)   Acute Rehab OT Goals Patient Stated Goal: to improve strength, manage pain OT Goal Formulation: With patient Time For Goal Achievement: 07/05/23 Potential to Achieve Goals: Good   OT Frequency:  Min 2X/week    Co-evaluation              AM-PAC OT "6 Clicks" Daily Activity     Outcome Measure Help from another person eating meals?: None Help from another person taking care of personal grooming?: A Little Help from another person toileting, which includes using toliet, bedpan, or urinal?: Total Help from another person bathing (including washing,  rinsing, drying)?: A Lot Help from another person to put on and taking off regular upper body clothing?: A Lot Help from another person to put on and taking off regular lower body clothing?: Total 6 Click Score: 13   End of Session Nurse Communication: Mobility status  Activity Tolerance: Patient limited by pain Patient left: in bed;with call bell/phone within reach;with bed alarm set  OT Visit Diagnosis: Unsteadiness on feet (R26.81);Other abnormalities of gait and mobility (R26.89);Muscle weakness (generalized) (M62.81);Repeated falls (R29.6);History of falling (Z91.81);Pain Pain - Right/Left: Right Pain - part of body: Ankle and joints of foot                Time: 4098-1191 OT Time Calculation (min): 25 min Charges:  OT General  Charges $OT Visit: 1 Visit OT Evaluation $OT Eval Moderate Complexity: 1 Mod OT Treatments $Self Care/Home Management : 8-22 mins  [address] OTR/L   Stacie Templin R Kirk Sampley 06/21/2023, 2:21 PM

## 2023-06-21 NOTE — Progress Notes (Signed)
 PT Cancellation Note  Patient Details Name: Harold [name] MRN: [id number] DOB: March 13, 1955   Cancelled Treatment:    Reason Eval/Treat Not Completed: Patient at procedure or test/unavailable (Pt off the floor at procedure. Will follow up tomorrow.)   Danikah Budzik 06/21/2023, 8:02 AM

## 2023-06-21 NOTE — Anesthesia Postprocedure Evaluation (Signed)
 Anesthesia Post Note  Patient: Harold [name]  Procedure(s) Performed: REMOVAL, HARDWARE (Right: Ankle)     Patient location during evaluation: PACU Anesthesia Type: General Level of consciousness: awake and alert Pain management: pain level controlled Vital Signs Assessment: post-procedure vital signs reviewed and stable Respiratory status: spontaneous breathing, nonlabored ventilation and respiratory function stable Cardiovascular status: stable and blood pressure returned to baseline Anesthetic complications: no   No notable events documented.  Last Vitals:  Vitals:   06/21/23 0845 06/21/23 0853  BP: (!) 126/57 (!) 128/55  Pulse: (!) 58 (!) 58  Resp: 15 13  Temp:  36.5 C  SpO2: 98% 98%    Last Pain:  Vitals:   06/21/23 0534  TempSrc: Oral  PainSc:                  [name]

## 2023-06-21 NOTE — Op Note (Addendum)
 Orthopaedic Surgery Operative Note (CSN: [id number] ) Date of Surgery: 06/21/2023  Admit Date: 06/19/2023   Diagnoses: Pre-Op Diagnoses: Exposed right ankle/foot hardware  Post-Op Diagnosis: Same  Procedures: CPT 20680-Removal of hardware right foot  Surgeons : Primary: Laneta Pintos, MD  Assistant: None  Location: OR 5   Anesthesia:General   Antibiotics: Ancef  2g preop   Tourniquet time: None    Estimated Blood Loss: Minimal  Complications:* No complications entered in OR log *   Specimens:* No specimens in log *   Implants: * No implants in log *   Indications for Surgery: 68 year old male who had a complicated history with his right lower extremity.  He underwent open reduction internal fixation that subsequently got infected.  His hardware was taken out and his fracture appeared to be healed.  He was then discharged home from the hospital and subsequently got up and fell sustaining a refracture.  He was placed in Ex-Fix and performed an I&D.  He subsequently underwent hindfoot fusion nailing for stabilization.  He has not been able to follow-up since that surgery which is end of March due to persistent hypotension and seizure like activity. We have been in constant contact with his husband and the home health nurse to try and coordinate appropriate care.  He presented to the emergency room once on of the screws was exposed from his hindfoot nail.  He was indicated for hardware removal.  Risks and benefits were discussed with him and his husband.  Risks include but not limited to bleeding, infection, persistent hardware failure, nerve and blood vessel injury, even possible amputation and anesthetic complications.  They agreed to proceed with surgery and consent was obtained.  Operative Findings: 1.  Removal of calcaneal screws through the hindfoot fusion nail with one of them exposed through the skin.  Procedure: The patient was identified in the preoperative holding area.  Consent was confirmed with the patient and their family and all questions were answered. The operative extremity was marked after confirmation with the patient. he was then brought back to the operating room by our anesthesia colleagues.  He was placed under general anesthetic and carefully transferred over to radiolucent flattop table.  The right lower extremity was then prepped and draped in usual sterile fashion.  A timeout was performed to verify the patient, the procedure, and the extremity.  Preoperative antibiotics were dosed.  I removed the exposed screw without difficulty it was loose and did not really have any purchase in the bone.  Was able to palpate subcutaneously the other screw which had backed out on the x-rays.  I incised over the screw head and remove this and again there is minimal purchase in the bone.  The fracture remained well aligned in the tibial talar joint well aligned after removal of the screws.  And final fluoroscopic imaging was obtained.  The incisions were irrigated and closed with 3-0 nylon.  I did take a curette prior to closure to debride both of the screw tracts.  There was no gross purulence that was present.  The patient was dressed with Mepitel, 4 x 4's, sterile cast padding and Ace wrap's.  He was then awoke from anesthesia and taken to the PACU in stable condition.  Post Op Plan/Instructions: Patient will be weightbearing as tolerated to the right lower extremity.  He will continue to receive antibiotics per infectious disease.  He can restart his Plavix  postoperative day 1.  I was present and performed the entire surgery.  Katheryne Pane, MD [facility]

## 2023-06-21 NOTE — Plan of Care (Signed)
   Problem: Clinical Measurements: Goal: Diagnostic test results will improve Outcome: Progressing   Problem: Activity: Goal: Risk for activity intolerance will decrease Outcome: Progressing   Problem: Nutrition: Goal: Adequate nutrition will be maintained Outcome: Progressing

## 2023-06-21 NOTE — Anesthesia Procedure Notes (Signed)
 Procedure Name: Intubation Date/Time: 06/21/2023 7:57 AM  Performed by: Emmitt Harp, CRNAPre-anesthesia Checklist: Patient identified, Emergency Drugs available, Suction available and Patient being monitored Patient Re-evaluated:Patient Re-evaluated prior to induction Oxygen Delivery Method: Circle System Utilized Preoxygenation: Pre-oxygenation with 100% oxygen Induction Type: IV induction Ventilation: Mask ventilation without difficulty Laryngoscope Size: Mac and 3 Grade View: Grade I Tube type: Oral Tube size: 7.0 mm Number of attempts: 1 Airway Equipment and Method: Stylet and Oral airway Placement Confirmation: ETT inserted through vocal cords under direct vision, positive ETCO2 and breath sounds checked- equal and bilateral Secured at: 24 cm Tube secured with: Tape Dental Injury: Teeth and Oropharynx as per pre-operative assessment

## 2023-06-21 NOTE — Interval H&P Note (Signed)
 History and Physical Interval Note:  06/21/2023 7:31 AM  Harold [name]  has presented today for surgery, with the diagnosis of Right ankle hardware failure.  The various methods of treatment have been discussed with the patient and family. After consideration of risks, benefits and other options for treatment, the patient has consented to  Procedure(s): REMOVAL, HARDWARE (Right) as a surgical intervention.  The patient's history has been reviewed, patient examined, no change in status, stable for surgery.  I have reviewed the patient's chart and labs.  Questions were answered to the patient's satisfaction.     Whyatt Klinger P Craven Crean

## 2023-06-21 NOTE — Transfer of Care (Signed)
 Immediate Anesthesia Transfer of Care Note  Patient: Harold [name]  Procedure(s) Performed: REMOVAL, HARDWARE (Right: Ankle)  Patient Location: PACU  Anesthesia Type:General  Level of Consciousness: awake, alert , and oriented  Airway & Oxygen Therapy: Patient Spontanous Breathing  Post-op Assessment: Report given to RN and Post -op Vital signs reviewed and stable  Post vital signs: Reviewed and stable  Last Vitals:  Vitals Value Taken Time  BP 107/58 06/21/23 0820  Temp 36.3 C 06/21/23 0820  Pulse 59 06/21/23 0826  Resp 22 06/21/23 0826  SpO2 95 % 06/21/23 0826  Vitals shown include unfiled device data.  Last Pain:  Vitals:   06/21/23 0534  TempSrc: Oral  PainSc:          Complications: No notable events documented.

## 2023-06-22 ENCOUNTER — Encounter (HOSPITAL_COMMUNITY): Payer: Self-pay | Admitting: Student

## 2023-06-22 DIAGNOSIS — I739 Peripheral vascular disease, unspecified: Secondary | ICD-10-CM | POA: Diagnosis not present

## 2023-06-22 DIAGNOSIS — T847XXA Infection and inflammatory reaction due to other internal orthopedic prosthetic devices, implants and grafts, initial encounter: Secondary | ICD-10-CM | POA: Diagnosis not present

## 2023-06-22 DIAGNOSIS — I272 Pulmonary hypertension, unspecified: Secondary | ICD-10-CM | POA: Diagnosis not present

## 2023-06-22 DIAGNOSIS — J449 Chronic obstructive pulmonary disease, unspecified: Secondary | ICD-10-CM | POA: Diagnosis not present

## 2023-06-22 LAB — CBC
HCT: 25.3 % — ABNORMAL LOW (ref 39.0–52.0)
Hemoglobin: 8.2 g/dL — ABNORMAL LOW (ref 13.0–17.0)
MCH: 29 pg (ref 26.0–34.0)
MCHC: 32.4 g/dL (ref 30.0–36.0)
MCV: 89.4 fL (ref 80.0–100.0)
Platelets: 132 10*3/uL — ABNORMAL LOW (ref 150–400)
RBC: 2.83 MIL/uL — ABNORMAL LOW (ref 4.22–5.81)
RDW: 15.4 % (ref 11.5–15.5)
WBC: 6.7 10*3/uL (ref 4.0–10.5)
nRBC: 0 % (ref 0.0–0.2)

## 2023-06-22 LAB — GLUCOSE, CAPILLARY
Glucose-Capillary: 187 mg/dL — ABNORMAL HIGH (ref 70–99)
Glucose-Capillary: 131 mg/dL — ABNORMAL HIGH (ref 70–99)
Glucose-Capillary: 155 mg/dL — ABNORMAL HIGH (ref 70–99)
Glucose-Capillary: 132 mg/dL — ABNORMAL HIGH (ref 70–99)

## 2023-06-22 LAB — BASIC METABOLIC PANEL WITH GFR
Anion gap: 7 (ref 5–15)
BUN: 16 mg/dL (ref 8–23)
CO2: 22 mmol/L (ref 22–32)
Calcium: 8.2 mg/dL — ABNORMAL LOW (ref 8.9–10.3)
Chloride: 106 mmol/L (ref 98–111)
Creatinine, Ser: 2.14 mg/dL — ABNORMAL HIGH (ref 0.61–1.24)
GFR, Estimated: 33 mL/min — ABNORMAL LOW (ref >60)
Glucose, Bld: 204 mg/dL — ABNORMAL HIGH (ref 70–99)
Potassium: 3.8 mmol/L (ref 3.5–5.1)
Sodium: 135 mmol/L (ref 135–145)

## 2023-06-22 LAB — MAGNESIUM: Magnesium: 1.8 mg/dL (ref 1.7–2.4)

## 2023-06-22 MED ORDER — APIXABAN 5 MG PO TABS
5.0000 mg | ORAL_TABLET | Freq: Two times a day (BID) | ORAL | Status: DC
  Administered 2023-06-22 – 2023-06-28 (×13): 5 mg via ORAL
  Filled 2023-06-22 (×13): qty 1

## 2023-06-22 MED ORDER — CLOPIDOGREL BISULFATE 75 MG PO TABS
75.0000 mg | ORAL_TABLET | Freq: Every day | ORAL | Status: DC
  Administered 2023-06-22 – 2023-06-28 (×7): 75 mg via ORAL
  Filled 2023-06-22 (×7): qty 1

## 2023-06-22 NOTE — Progress Notes (Signed)
 Orthopedic Tech Progress Note Patient Details:  Harold [name] 1956/01/10 [id number]  Patient ID: Harold [name], male   DOB: 07-15-1955, 68 y.o.   MRN: [id number] Patient exceeds weight limit for OHF. Toi Foster 06/22/2023, 6:57 AM

## 2023-06-22 NOTE — Progress Notes (Addendum)
 PHARMACY - ANTICOAGULATION CONSULT NOTE  Pharmacy Consult for apixaban  (Eliquis ) and clopidogrel  Indication: pulmonary embolus  Allergies  Allergen Reactions   Beta-Lactamase Inhibitors (Beta-Lactam) Hives, Itching and Rash    AX - penicillin     Ancef  given 9/24 with no obvious reaction   Metformin  And Related Diarrhea and Nausea Only   Other Nausea And Vomiting    General Anesthesia - sometimes causes nausea and vomiting * OK to use scopolamine  patch     Sulfa Antibiotics Hives and Other (See Comments)    Bactrim   Wellbutrin [Bupropion] Other (See Comments)    Mood Changes   Tetracyclines & Related Hives and Rash    doxycycline     Patient Measurements: Height: 6' 0.99" (185.4 cm) Weight: 124.5 kg (274 lb 7.6 oz) IBW/kg (Calculated) : 79.88 HEPARIN  DW (KG): 107.2  Vital Signs: Temp: 98.9 F (37.2 C) (05/04 0738) Temp Source: Oral (05/04 0738) BP: 125/69 (05/04 0738) Pulse Rate: 60 (05/04 0738)  Labs: Recent Labs    06/20/23 0114 06/20/23 0115 06/21/23 4098 06/22/23 0546  HGB 7.3*  --  8.7* 8.2*  HCT 22.1*  --  26.7* 25.3*  PLT 128*  --  135* 132*  CREATININE 2.12* 2.14* 2.05* 2.14*  CKTOTAL  --   --  36*  --     Estimated Creatinine Clearance: 45 mL/min (A) (by C-G formula based on SCr of 2.14 mg/dL (H)).   Medical History: Past Medical History:  Diagnosis Date   Anemia    Anginal pain (HCC) 04/2013   Cardiac cath showed patent stents with distal LAD, circumflex-OM and RCA disease in small vessels.   Anxiety    Atrial fibrillation (HCC) 12/2021   CAD S/P percutaneous coronary angioplasty 2003, 04/2012   status post PCI to LAD, circumflex-OM 2, RCA   Carotid artery occlusion    Chronic renal insufficiency, stage II (mild)    Chronic venous insufficiency    varicosities, no reflux; dopplers 04/14/12- valvular insufficiency in the R and L GSV   Complication of anesthesia    COPD, mild (HCC)    Depression    situaltional    Diabetes mellitus     Diabetic neuropathy (HCC) 08/24/2019   Dyslipidemia associated with type 2 diabetes mellitus (HCC)    Fall at home, initial encounter 01/02/2023   GERD (gastroesophageal reflux disease)    History of cardioversion 12/2021   at [facility]   HTN (hypertension)    Hypothyroidism    Neuromuscular disorder (HCC)    neuropathy in feet   Neuropathy    notably improved following PCI with improved cardiac function   Obesity    Obesity, Class II, 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   Unknown   allopurinol  (ZYLOPRIM ) 100 MG tablet Take 0.5 tablets (50 mg total) by mouth daily. 15 tablet 0 Past Week   amiodarone  (  PACERONE ) 200 MG tablet Take 1 tablet (200 mg total) by mouth 2 (two) times daily. 60 tablet 0 Past Week   apixaban  (ELIQUIS ) 5 MG TABS tablet TAKE 1 TABLET BY MOUTH TWICE A DAY 60 tablet 6 Past Week   atorvastatin  (LIPITOR) 40 MG tablet Take 1 tablet (40 mg total) by mouth daily. (Patient taking differently: Take 40 mg by mouth at bedtime.) 30 tablet 0 Past Week   Cholecalciferol  (VITAMIN D ) 50 MCG (2000 UT) CAPS Take 2,000 Units by mouth daily.   Past Week   ciprofloxacin  (CIPRO ) 500 MG tablet Take 1 tablet (500 mg total) by mouth 2 (two) times daily for 14 days. 28 tablet 0 Past Week   clopidogrel  (PLAVIX ) 75 MG tablet Take 75 mg by mouth every morning.   06/19/2023   Continuous Glucose Sensor (FREESTYLE LIBRE 3 PLUS SENSOR) MISC  Inject 1 Device into the skin every 14 (fourteen) days.   Past Week   DULoxetine  (CYMBALTA ) 60 MG capsule Take 60 mg by mouth 2 (two) times daily.   Past Week   ezetimibe  (ZETIA ) 10 MG tablet Take 10 mg by mouth at bedtime.   Past Week   ferrous sulfate  325 (65 FE) MG tablet Take 325 mg by mouth 3 (three) times a week. Monday, Wednesday, Friday   Past Week   furosemide  (LASIX ) 80 MG tablet Take 0.5 tablets (40 mg total) by mouth daily. May take additional tablet for weight gain of 3 pounds in one day or five pounds in one week   Past Week   gabapentin  (NEURONTIN ) 300 MG capsule Take 1 capsule (300 mg total) by mouth at bedtime. 30 capsule 0 Past Week   levothyroxine  (SYNTHROID , LEVOTHROID) 88 MCG tablet Take 88 mcg by mouth daily before breakfast.   Past Week   magnesium  oxide (MAG-OX) 400 MG tablet Take 1 tablet by mouth daily.   Past Week   methocarbamol  (ROBAXIN ) 500 MG tablet Take 1 tablet (500 mg total) by mouth every 6 (six) hours as needed for muscle spasms. 28 tablet 0 Past Week   midodrine  (PROAMATINE ) 10 MG tablet Take 10 mg by mouth at bedtime.   Past Week   midodrine  (PROAMATINE ) 5 MG tablet Take 5 mg by mouth in the morning and at bedtime. In the morning and afternoon   Taking   Multiple Vitamin (MULTIVITAMIN WITH MINERALS) TABS tablet Take 1 tablet by mouth daily.   Past Week   nitroGLYCERIN  (NITROSTAT ) 0.4 MG SL tablet PLACE 1 TAB UNDER THE TONGUE EVERY 5 MINUTES AS NEEDED FOR CHEST PAIN (SEVERE PRESSURE OR TIGHTNESS) (Patient taking differently: Place 0.4 mg under the tongue every 5 (five) minutes as needed for chest pain. PLACE 1 TAB UNDER THE TONGUE EVERY 5 MINUTES AS NEEDED FOR CHEST PAIN (SEVERE PRESSURE OR TIGHTNESS)) 25 tablet 3 Unknown   oxyCODONE  (OXY IR/ROXICODONE ) 5 MG immediate release tablet Take 1 tablet (5 mg total) by mouth every 4 (four) hours as needed for severe pain (pain score 7-10). 42 tablet 0 Past Week   pantoprazole  (PROTONIX ) 40 MG tablet TAKE 1 TABLET BY MOUTH  EVERY DAY 90 tablet 3 Past Week   potassium chloride  (KLOR-CON  M) 10 MEQ tablet Take 10 mEq by mouth 2 (two) times daily.   Past Week   SODIUM FLUORIDE 5000 PPM 1.1 % PSTE Take 1 Application by mouth in the morning and at bedtime.   Past Week   tamsulosin  (FLOMAX ) 0.4 MG CAPS capsule Take 0.4 mg by mouth daily.  Past Week   tirzepatide  (MOUNJARO ) 15 MG/0.5ML Pen Inject 15 mg into the skin once a week. (Patient taking differently: Inject 15 mg into the skin once a week. Sunday) 6 mL 3 06/15/2023   [EXPIRED] TRESIBA  FLEXTOUCH 200 UNIT/ML FlexTouch Pen Inject 10 Units into the skin daily. 1.5 mL 0 Past Week   Vitamin D , Ergocalciferol , (DRISDOL ) 1.25 MG (50000 UNIT) CAPS capsule Take 1 capsule (50,000 Units total) by mouth every 7 (seven) days. (Patient taking differently: Take 50,000 Units by mouth every 7 (seven) days. Sundays) 4 capsule 0 Past Week   colchicine  0.6 MG tablet Take 1 tablet (0.6 mg total) by mouth every other day. (Patient not taking: Reported on 06/17/2023) 10 tablet 0 Not Taking   Scheduled:   allopurinol   50 mg Oral Daily   amiodarone   200 mg Oral BID   atorvastatin   40 mg Oral QHS   Chlorhexidine  Gluconate Cloth  6 each Topical Daily   cholecalciferol   2,000 Units Oral Daily   docusate sodium   100 mg Oral BID   DULoxetine   60 mg Oral BID   ezetimibe   10 mg Oral Daily   ferrous sulfate   325 mg Oral Once per day on Monday Wednesday Friday   gabapentin   300 mg Oral QHS   Gerhardt's butt cream   Topical BID   insulin  aspart  0-15 Units Subcutaneous TID WC   insulin  aspart  0-5 Units Subcutaneous QHS   insulin  glargine-yfgn  15 Units Subcutaneous QHS   leptospermum manuka honey  1 Application Topical Daily   levothyroxine   88 mcg Oral QAC breakfast   loratadine   10 mg Oral Daily   magnesium  oxide  400 mg Oral Daily   midodrine   5 mg Oral TID WC   multivitamin with minerals  1 tablet Oral Daily   pantoprazole   40 mg Oral Daily   tamsulosin   0.4 mg Oral Daily     Assessment: 68 year old M with PMH of CAD/PCI, CKD-4, COPD, PAH, OSA, DM with neuropathy, chronic venous insufficiency, orthostatic hypotension, HLD and diabetic foot ulcer, traumatic right ankle fracture in 12/2022 requiring hardware placement complicated by need for multiple surgeries and infection with Enterobacter cloacae bacteremia and recent right ankle arthrodesis on 3/27 presenting with pain, bleeding and exposed hardware.   Pharmacy consulted to resume PTA apixaban  and clopidogrel  POD1 s/p removal of hardware 5/3. AM Hgb stable at 8.2, PLT stable at 132. No signs or symptoms of bleeding per chart review. Patient on apixaban  5 mg BID PTA for atrial fibrillation. This dose remains appropriate, with Cr 2.14, Age 15, and weight 124.5 kg.   Goal of Therapy:  Monitor platelets by anticoagulation protocol: Yes   Plan:  Start PTA apixaban  5 mg by mouth twice daily Start PTA clopidogrel  75 mg by mouth once daily Pharmacy to sign off on consult Monitor H&H, signs of bleeding postoperatively  Estela Held, PharmD PGY-2 Infectious Diseases Pharmacy Resident [facility] 06/22/2023 7:55 AM

## 2023-06-22 NOTE — Progress Notes (Signed)
 Orthopaedic Trauma Progress Note  SUBJECTIVE: Doing okay this morning.  Notes some soreness in the foot but overall pain controlled.  No chest pain. No SOB. No nausea/vomiting. No other complaints.  Tolerating diet and fluids.  OBJECTIVE:  Vitals:   06/21/23 2052 06/22/23 0738  BP: 133/61 125/69  Pulse: (!) 58 60  Resp: 16 18  Temp: 98.5 F (36.9 C) 98.9 F (37.2 C)  SpO2: 100% 100%    Opiates Today (MME): Today's  total administered Morphine  Milligram Equivalents: 7.5 Opiates Yesterday (MME): Yesterday's total administered Morphine  Milligram Equivalents: 32.5  General: Sitting up in bed, no acute distress.  Pleasant and cooperative Respiratory: No increased work of breathing.  Operative Extremity (right lower extremity): Dressing clean, dry, intact.  Endorses sensation of light touch over the toes although diminished.  Able to wiggle the toes.  Toes warm well-perfused.  Some soreness to the calf but no areas of significant tenderness.  IMAGING: Intraoperative imaging shows removal of all screws from the calcaneus.  Remainder of hardware appears stable  LABS:  Results for orders placed or performed during the hospital encounter of 06/19/23 (from the past 24 hours)  Glucose, capillary     Status: Abnormal   Collection Time: 06/21/23 11:25 AM  Result Value Ref Range   Glucose-Capillary 172 (H) 70 - 99 mg/dL  Glucose, capillary     Status: Abnormal   Collection Time: 06/21/23  4:23 PM  Result Value Ref Range   Glucose-Capillary 288 (H) 70 - 99 mg/dL  Glucose, capillary     Status: Abnormal   Collection Time: 06/21/23  9:57 PM  Result Value Ref Range   Glucose-Capillary 258 (H) 70 - 99 mg/dL  Basic metabolic panel     Status: Abnormal   Collection Time: 06/22/23  5:46 AM  Result Value Ref Range   Sodium 135 135 - 145 mmol/L   Potassium 3.8 3.5 - 5.1 mmol/L   Chloride 106 98 - 111 mmol/L   CO2 22 22 - 32 mmol/L   Glucose, Bld 204 (H) 70 - 99 mg/dL   BUN 16 8 - 23 mg/dL    Creatinine, Ser 7.25 (H) 0.61 - 1.24 mg/dL   Calcium  8.2 (L) 8.9 - 10.3 mg/dL   GFR, Estimated 33 (L) >60 mL/min   Anion gap 7 5 - 15  CBC     Status: Abnormal   Collection Time: 06/22/23  5:46 AM  Result Value Ref Range   WBC 6.7 4.0 - 10.5 K/uL   RBC 2.83 (L) 4.22 - 5.81 MIL/uL   Hemoglobin 8.2 (L) 13.0 - 17.0 g/dL   HCT 36.6 (L) 44.0 - 34.7 %   MCV 89.4 80.0 - 100.0 fL   MCH 29.0 26.0 - 34.0 pg   MCHC 32.4 30.0 - 36.0 g/dL   RDW 42.5 95.6 - 38.7 %   Platelets 132 (L) 150 - 400 K/uL   nRBC 0.0 0.0 - 0.2 %  Magnesium      Status: None   Collection Time: 06/22/23  5:46 AM  Result Value Ref Range   Magnesium  1.8 1.7 - 2.4 mg/dL  Glucose, capillary     Status: Abnormal   Collection Time: 06/22/23  7:03 AM  Result Value Ref Range   Glucose-Capillary 187 (H) 70 - 99 mg/dL   *Note: Due to a large number of results and/or encounters for the requested time period, some results have not been displayed. A complete set of results can be found in Results Review.  ASSESSMENT: Harold [name] is a 68 y.o. male, 1 Day Post-Op s/p REMOVAL OF HARDWARE RIGHT FOOT  CV/Blood loss: Acute blood loss anemia, Hgb 8.2 this morning this AM. Hemodynamically stable  PLAN: Weightbearing: WBAT RLE ROM: Unrestricted motion of the knee and ankle Incisional and dressing care: Reinforce dressings as needed  Showering: Keep dressing dry when showering. Orthopedic device(s): None  Pain management:  1. Tylenol  650 mg q 6 hours PRN 2. Robaxin  500 mg q 6 hours PRN 3. Oxycodone  5 mg q 4 hours PRN 4. Dilaudid  0.5 mg q 2 hours PRN VTE prophylaxis: Plavix , Eliquis . SCDs ID: Antibiotics per infectious disease Foley/Lines:  No foley, KVO IVFs Impediments to Fracture Healing: DM, CKD, venous insufficiency.  Vitamin D  deficiency Dispo: PT/OT evaluation today.  Ortho issues stable.  Okay for discharge from ortho standpoint once cleared by medicine team and therapies  D/C recommendations: - Oxycodone ,  Robaxin , Tylenol  for pain control - Continue home dose Plavix  and Eliquis  for DVT prophylaxis - Continue home dose Vit D supplementation  Follow - up plan: 2 weeks after d/c for wound check and repeat x-rays   Contact information:  Katheryne Pane MD, Alona Jamaica PA-C. After hours and holidays please check [email or web address] for group call information for [facility]   Edilia Gordon, PA-C [phone] (office) [email or web address]

## 2023-06-22 NOTE — Evaluation (Signed)
 Physical Therapy Evaluation Patient Details Name: Harold [name] MRN: [id number] DOB: [birth year] Today's Date: 06/22/2023  History of Present Illness  68 y.o. male admitted 06/19/23 with R heel wound and exposed hardware, also with hypotension and seizure-like activity at home per [facility]. S/p hardware removal R foot on 5/3. Of note, h/o R ankle fx s/p R ankle/subtalar arthrodesis 05/15/23. Other PMH includes HTN, HLD, CAD, COPD, DM2, CKD, PVD, gout, afib, obesity, neuropathy.   Clinical Impression  Pt presents with an overall decrease in functional mobility secondary to above. PTA, pt has been primarily recliner-bound, working with spouse and HHPT on standing transfers with RW to [facility], though limited by syncope, weakness and pain. Today, pt requiring maxA for bed mobility, min-maxA for sitting balance; pt frequently wanting to lay himself down due to pain and fatigue limiting OOB transfer initiation. Pt limited by generalized weakness, decreased activity tolerance, poor balance strategies and impaired cognition. Recommend post-acute rehab services (<3 hrs/day) to maximize functional mobility and independence prior to return home.      If plan is discharge home, recommend the following: Two people to help with walking and/or transfers;A lot of help with bathing/dressing/bathroom;Assistance with cooking/housework;Assist for transportation;Help with stairs or ramp for entrance   Can travel by private vehicle   No    Equipment Recommendations  (TBD - potential hospital bed, smaller w/c size)  Recommendations for Other Services       Functional Status Assessment Patient has had a recent decline in their functional status and demonstrates the ability to make significant improvements in function in a reasonable and predictable amount of time.     Precautions / Restrictions Precautions Precautions: Fall Recall of Precautions/Restrictions: Intact Restrictions Weight Bearing Restrictions Per  Provider Order: Yes RLE Weight Bearing Per Provider Order: Weight bearing as tolerated      Mobility  Bed Mobility Overal bed mobility: Needs Assistance Bed Mobility: Rolling, Supine to Sit, Sit to Supine Rolling: Supervision   Supine to sit: Max assist     General bed mobility comments: pt repositioning self well, moving BLEs and rolling L with bed rail, no physical assist; pt achieving partial sidelying but repeatedly laying back on side, ultimately requiring maxA for HHA to elevate trunk. pt laying self back down requiring maxA to control trunk descent, modA for RLE management to get over edge of bed. once supine, pt able to scoot hips and slide up in bed (trendelenberg) with use of bed rails    Transfers Overall transfer level: Needs assistance                Lateral/Scoot Transfers: Max assist General transfer comment: pt able to minimally scoot hips along EOB with maxA and bed pad use, frequent posterior LOB limiting transfer ability and pt eventually stopping to lay self down    Ambulation/Gait                  Stairs            Wheelchair Mobility     Tilt Bed    Modified Rankin (Stroke Patients Only)       Balance Overall balance assessment: Needs assistance Sitting-balance support: Feet supported, Bilateral upper extremity supported, Single extremity supported Sitting balance-Leahy Scale: Poor Sitting balance - Comments: fluctuating CGA-maxA for static sitting balance with and without UE support; when taking meds from RN, pt able to lean forward and briefly hold balance without assist; inconsistent LOB  Pertinent Vitals/Pain Pain Assessment Pain Assessment: Faces Faces Pain Scale: Hurts even more Pain Location: initially c/o R foot pain, later states "my R hip feels like it's going to break" Pain Descriptors / Indicators: Aching, Constant Pain Intervention(s): Monitored during session,  Repositioned, RN gave pain meds during session    Home Living Family/patient expects to be discharged to:: Private residence Living Arrangements: Spouse/significant other Available Help at Discharge: Family;Available 24 hours/day Type of Home: House Home Access: Ramped entrance       Home Layout: One level Home Equipment: Pharmacist, hospital (2 wheels);Transport chair;Grab bars - toilet;Grab bars - tub/shower;BSC/3in1;Wheelchair - manual      Prior Function Prior Level of Function : History of Falls (last six months);Needs assist             Mobility Comments: sleeps in recliner; working on stand pivot transfers using walker with HHPT; able to do some with spouse; many falls, reports episodes of syncope; reports dealing with R ankle injury since 12/2022 ADLs Comments: Husband helps with toileting (BSC), LB dressing. sponge bathing. w/c won't fit through doorways. HHRN 1-2x/wk for 2-3 hours to change bandage and washing     Extremity/Trunk Assessment   Upper Extremity Assessment Upper Extremity Assessment: Generalized weakness    Lower Extremity Assessment Lower Extremity Assessment: Generalized weakness;RLE deficits/detail RLE Deficits / Details: s/p R ankle hardware removal wrapped in ace bandage; observed functional hip and knee strength >/ 3/5 though limited by pain and pt reluctant to move when asked       Communication   Communication Communication: No apparent difficulties    Cognition Arousal: Alert Behavior During Therapy: WFL for tasks assessed/performed   PT - Cognitive impairments: No family/caregiver present to determine baseline, Awareness, Attention, Problem solving, Safety/Judgement                       PT - Cognition Comments: pt reports his cognition is clear and normal. demonstrates decreased awareness, poor attention requires frequent encouragement to continue participating in current task as opposed to laying himself back down;  slowed speech at times, denies dizziness or fatigue. difficult to determine true cognitive impairment vs fatigue vs decreased desire to participate Following commands: Intact       Cueing Cueing Techniques: Verbal cues     General Comments General comments (skin integrity, edema, etc.): pt initially states, "I'm not getting out of bed today," requiring encouragement to participate. educ re: role of acute PT, POC, activity recommendations, importance of OOB mobility, potential d/c needs including SNF recommendation    Exercises Other Exercises Other Exercises: partial range RLE SLR, heel slides   Assessment/Plan    PT Assessment Patient needs continued PT services  PT Problem List Decreased strength;Decreased range of motion;Decreased activity tolerance;Decreased balance;Decreased mobility;Decreased cognition;Decreased knowledge of use of DME;Decreased safety awareness;Decreased knowledge of precautions;Pain;Obesity       PT Treatment Interventions DME instruction;Gait training;Stair training;Functional mobility training;Therapeutic activities;Therapeutic exercise;Balance training;Patient/family education;Wheelchair mobility training;Cognitive remediation    PT Goals (Current goals can be found in the Care Plan section)  Acute Rehab PT Goals Patient Stated Goal: return home, decreased pain PT Goal Formulation: With patient Time For Goal Achievement: 07/06/23 Potential to Achieve Goals: Good    Frequency Min 2X/week     Co-evaluation               AM-PAC PT "6 Clicks" Mobility  Outcome Measure Help needed turning from your back to your side while in a flat  bed without using bedrails?: A Lot Help needed moving from lying on your back to sitting on the side of a flat bed without using bedrails?: A Lot Help needed moving to and from a bed to a chair (including a wheelchair)?: Total Help needed standing up from a chair using your arms (e.g., wheelchair or bedside chair)?:  Total Help needed to walk in hospital room?: Total Help needed climbing 3-5 steps with a railing? : Total 6 Click Score: 8    End of Session   Activity Tolerance: Patient tolerated treatment well;Patient limited by fatigue;Other (comment) Patient left: in bed;with call bell/phone within reach;with bed alarm set Nurse Communication: Mobility status PT Visit Diagnosis: Other abnormalities of gait and mobility (R26.89);Pain Pain - Right/Left: Right    Time: 1478-2956 PT Time Calculation (min) (ACUTE ONLY): 20 min   Charges:   PT Evaluation $PT Eval Moderate Complexity: 1 Mod   PT General Charges $$ ACUTE PT VISIT: 1 Visit       Blase Bur, PT, DPT Acute Rehabilitation Services  Personal: Secure Chat Rehab Office: [phone]  Albino Hum 06/22/2023, 10:16 AM

## 2023-06-22 NOTE — Progress Notes (Signed)
 PROGRESS NOTE  Harold [name] NFA:[id number] DOB: 28-Jun-1955   PCP: Amin, Saad, MD  Patient is from: Home.  Lives with significant other.  DOA: 06/19/2023 LOS: 3  Chief complaints Chief Complaint  Patient presents with   Post-op Problem     Brief Narrative / Interim history: 68 year old M with PMH of CAD/PCI, CKD-4, COPD, PAH, OSA, DM with neuropathy, chronic venous insufficiency, orthostatic hypotension, HLD and diabetic foot ulcer, traumatic right ankle fracture in 12/2022 requiring hardware placement complicated by need for multiple surgeries and infection with Enterobacter cloacae bacteremia and recent right ankle arthrodesis on 3/27 presenting with pain, bleeding and exposed hardware.  No report of fever or constitutional symptoms.  He was on ciprofloxacin  outpatient.  In ED, stable vitals.  No leukocytosis.  CRP 5.0. Cr 2.18 (above baseline).  Hgb 8.8 (above baseline).  Right ankle x-ray showed postoperative changes with surgical hardware unchanged from prior, mildly displaced fracture deformity of the posterior tibial malleolus, degenerative changes in the ankle joint and first MPJ and soft tissue swelling over the dorsum of the foot.  Blood cultures obtained.  Started on broad-spectrum antibiotics.  Orthopedic surgery consulted   The next day, blood cultures NGTD.  Hgb dropped to 7.3.  Transfused 1 unit with appropriate response.  Underwent hardware removal of right foot on 5/3.  Subjective: Seen and examined earlier this morning after he returned from surgery.  No complaints.  Denies chest pain, shortness of breath, dizziness, GI or UTI symptoms.  Pain well-controlled.  Objective: Vitals:   06/21/23 1316 06/21/23 1621 06/21/23 2052 06/22/23 0738  BP: (!) 122/53 130/61 133/61 125/69  Pulse: 65 61 (!) 58 60  Resp: 18 17 16 18   Temp:  98 F (36.7 C) 98.5 F (36.9 C) 98.9 F (37.2 C)  TempSrc:  Oral  Oral  SpO2: 99% 98% 100% 100%  Weight:      Height:         Examination:  GENERAL: No apparent distress.  Nontoxic. HEENT: MMM.  Vision and hearing grossly intact.  NECK: Supple.  No apparent JVD.  RESP:  No IWOB.  Fair aeration bilaterally. CVS:  RRR. Heart sounds normal.  ABD/GI/GU: BS+. Abd soft, NTND.  MSK/EXT:  Moves extremities.  Ace wrap over right foot. SKIN: Ace wrap over right foot. NEURO: Awake, alert and oriented appropriately.  No apparent focal neuro deficit. PSYCH: Calm. Normal affect.     Consultants:  Orthopedic surgery  Procedures: 5/3-right foot/ankle hardware removal by Dr. Curtiss Dowdy  Microbiology summarized: Blood cultures NGTD  Assessment and plan: Right ankle hardware infection: Patient with traumatic right ankle fracture in 12/2022 requiring hardware placement complicated by need for multiple surgeries and infection with Enterobacter cloacae bacteremia and recent right ankle arthrodesis on 3/27.  Seems  to be on Cipro  outpatient. -S/p right ankle hardware removal by Dr. Curtiss Dowdy on 5/3 -On daptomycin  and cefepime  per ID. -ID and orthopedic surgery on board -Pain control per Ortho -On home Plavix  and Eliquis  which would serve VTE prophylaxis -WBAT on RLE per Ortho -PT/OT-recommended SNF.  IDDM-2 with hyperglycemia, neuropathy and diabetic ulcer: A1c 7.0% in 12/2022 Recent Labs  Lab 06/21/23 1125 06/21/23 1623 06/21/23 2157 06/22/23 0703 06/22/23 1154  GLUCAP 172* 288* 258* 187* 131*  -Continue Semglee  15 units at night -Continue SSI-moderate -Continue home Cymbalta , gabapentin  and Lipitor -Repeat A1c not reliable after transfusion.   CKD-4: At baseline Recent Labs    05/17/23 0917 05/18/23 0809 05/19/23 0759 05/20/23 0614 05/21/23 0504 05/26/23 1420 06/19/23  2023 06/20/23 0114 06/20/23 0115 06/21/23 0633 06/22/23 0546  BUN 32* 30* 27* 26* 26* 33* 17  --  18 15 16   CREATININE 3.10* 2.83* 2.76* 2.62* 2.38* 2.47* 2.18* 2.12* 2.14* 2.05* 2.14*  - Continue monitoring  Paroxysmal A-fib: Rate  controlled -Continue amiodarone  and Eliquis    pulmonary hypertension/OSA - CPAP at night - Minimize sedating medications   Orthostatic hypotension: Chronic.  BP within acceptable range.. - Restarted home midodrine  at 5 mg 3 times daily on 5/3  Anemia of renal disease: Hgb dropped to 7.3 on 5/2.  Transfused 1 units with appropriate response.  Baseline Hgb about 8.0.  No nutritional deficiency on anemia panel Recent Labs    05/17/23 0441 05/18/23 0809 05/19/23 0759 05/20/23 0614 05/26/23 1420 05/26/23 1427 06/19/23 2023 06/20/23 0114 06/21/23 0633 06/22/23 0546  HGB 8.0* 7.7* 7.5* 8.6* 10.0* 9.5* 8.8* 7.3* 8.7* 8.2*  - Continue monitoring    PAD: On Plavix  and Lipitor - Continue home Lipitor and Plavix    Hypothyroidism:  -Continue levothyroxine   BPH - Continue home Flomax   GERD - Continue PPI  History of gout -Continue allopurinol    Class II obesity Body mass index is 36.22 kg/m.  Pressure skin injury: Present on arrival Pressure Injury 06/20/23 Sacrum Right Unstageable - Full thickness tissue loss in which the base of the injury is covered by slough (yellow, tan, gray, green or brown) and/or eschar (tan, brown or black) in the wound bed. (Active)  06/20/23 0050  Location: Sacrum  Location Orientation: Right  Staging: Unstageable - Full thickness tissue loss in which the base of the injury is covered by slough (yellow, tan, gray, green or brown) and/or eschar (tan, brown or black) in the wound bed.  Wound Description (Comments):   Present on Admission: Yes  Dressing Type Foam - Lift dressing to assess site every shift 06/20/23 0358   DVT prophylaxis:  SCDs Start: 06/21/23 0914 SCDs Start: 06/19/23 2358 apixaban  (ELIQUIS ) tablet 5 mg  Code Status: Full code Family Communication: None at bedside today.  Updated significant other at bedside on 5/3 Level of care: Med-Surg Status is: Inpatient Remains inpatient appropriate because: Right ankle hardware  infection   Final disposition: SNF   55 minutes with more than 50% spent in reviewing records, counseling patient/family and coordinating care.   Sch Meds:  Scheduled Meds:  allopurinol   50 mg Oral Daily   amiodarone   200 mg Oral BID   apixaban   5 mg Oral BID   atorvastatin   40 mg Oral QHS   cholecalciferol   2,000 Units Oral Daily   clopidogrel   75 mg Oral Daily   docusate sodium   100 mg Oral BID   DULoxetine   60 mg Oral BID   ezetimibe   10 mg Oral Daily   ferrous sulfate   325 mg Oral Once per day on Monday Wednesday Friday   gabapentin   300 mg Oral QHS   Gerhardt's butt cream   Topical BID   insulin  aspart  0-15 Units Subcutaneous TID WC   insulin  aspart  0-5 Units Subcutaneous QHS   insulin  glargine-yfgn  15 Units Subcutaneous QHS   leptospermum manuka honey  1 Application Topical Daily   levothyroxine   88 mcg Oral QAC breakfast   loratadine   10 mg Oral Daily   magnesium  oxide  400 mg Oral Daily   midodrine   5 mg Oral TID WC   multivitamin with minerals  1 tablet Oral Daily   pantoprazole   40 mg Oral Daily   tamsulosin   0.4 mg Oral Daily   Continuous Infusions:  ceFEPime  (MAXIPIME ) IV 2 g (06/22/23 1133)   DAPTOmycin  800 mg (06/22/23 1519)   PRN Meds:.acetaminophen  **OR** acetaminophen , HYDROmorphone  (DILAUDID ) injection, methocarbamol , metoCLOPramide  **OR** metoCLOPramide  (REGLAN ) injection, nitroGLYCERIN , oxyCODONE , polyethylene glycol  Antimicrobials: Anti-infectives (From admission, onward)    Start     Dose/Rate Route Frequency Ordered Stop   06/21/23 2200  DAPTOmycin  (CUBICIN ) 800 mg in sodium chloride  0.9 % IVPB        8 mg/kg  97.7 kg (Adjusted) 132 mL/hr over 30 Minutes Intravenous Daily 06/20/23 1005     06/21/23 0716  ceFAZolin  (ANCEF ) 2-4 GM/100ML-% IVPB       Note to Pharmacy: Darrell Else D: cabinet override      06/21/23 0716 06/21/23 0757   06/21/23 0715  ceFAZolin  (ANCEF ) IVPB 2g/100 mL premix        2 g 200 mL/hr over 30 Minutes  Intravenous  Once 06/21/23 0706 06/21/23 0753   06/20/23 0045  ciprofloxacin  (CIPRO ) tablet 500 mg  Status:  Discontinued        500 mg Oral 2 times daily 06/19/23 2358 06/20/23 1001   06/19/23 2316  vancomycin  (VANCOREADY) IVPB 2000 mg/400 mL  Status:  Discontinued        2,000 mg 200 mL/hr over 120 Minutes Intravenous Every 48 hours 06/19/23 2316 06/20/23 1005   06/19/23 2315  ceFEPIme  (MAXIPIME ) 2 g in sodium chloride  0.9 % 100 mL IVPB        2 g 200 mL/hr over 30 Minutes Intravenous Every 12 hours 06/19/23 2311     06/19/23 2245  cefTRIAXone  (ROCEPHIN ) 2 g in sodium chloride  0.9 % 100 mL IVPB  Status:  Discontinued       Placed in "And" Linked Group   2 g 200 mL/hr over 30 Minutes Intravenous Once 06/19/23 2241 06/19/23 2311   06/19/23 2245  metroNIDAZOLE  (FLAGYL ) IVPB 500 mg       Placed in "And" Linked Group   500 mg 100 mL/hr over 60 Minutes Intravenous  Once 06/19/23 2241 06/20/23 0014   06/19/23 2245  vancomycin  (VANCOCIN ) IVPB 1000 mg/200 mL premix  Status:  Discontinued       Placed in "And" Linked Group   1,000 mg 200 mL/hr over 60 Minutes Intravenous  Once 06/19/23 2241 06/19/23 2243   06/19/23 2245  vancomycin  (VANCOREADY) IVPB 2000 mg/400 mL  Status:  Discontinued        2,000 mg 200 mL/hr over 120 Minutes Intravenous  Once 06/19/23 2244 06/19/23 2316        I have personally reviewed the following labs and images: CBC: Recent Labs  Lab 06/19/23 2023 06/20/23 0114 06/21/23 0633 06/22/23 0546  WBC 4.9 4.2 4.8 6.7  NEUTROABS 2.9  --   --   --   HGB 8.8* 7.3* 8.7* 8.2*  HCT 27.0* 22.1* 26.7* 25.3*  MCV 89.1 88.0 88.4 89.4  PLT 158 128* 135* 132*   BMP &GFR Recent Labs  Lab 06/19/23 2023 06/20/23 0114 06/20/23 0115 06/21/23 0633 06/22/23 0546  NA 132*  --  136 137 135  K 4.4  --  4.1 3.8 3.8  CL 101  --  104 108 106  CO2 20*  --  22 22 22   GLUCOSE 159*  --  149* 133* 204*  BUN 17  --  18 15 16   CREATININE 2.18* 2.12* 2.14* 2.05* 2.14*  CALCIUM   8.3*  --  8.1* 8.2* 8.2*  MG  --   --   --  1.7 1.8  PHOS  --   --   --  3.1  --    Estimated Creatinine Clearance: 45 mL/min (A) (by C-G formula based on SCr of 2.14 mg/dL (H)). Liver & Pancreas: Recent Labs  Lab 06/19/23 2023 06/20/23 0115 06/21/23 0633  AST 34 18  --   ALT 9 12  --   ALKPHOS 111 94  --   BILITOT 1.3* 0.6  --   PROT 5.4* 4.7*  --   ALBUMIN  2.3* 2.0* 2.0*   No results for input(s): "LIPASE", "AMYLASE" in the last 168 hours. No results for input(s): "AMMONIA" in the last 168 hours. Diabetic: No results for input(s): "HGBA1C" in the last 72 hours. Recent Labs  Lab 06/21/23 1125 06/21/23 1623 06/21/23 2157 06/22/23 0703 06/22/23 1154  GLUCAP 172* 288* 258* 187* 131*   Cardiac Enzymes: Recent Labs  Lab 06/21/23 0633  CKTOTAL 36*   No results for input(s): "PROBNP" in the last 8760 hours. Coagulation Profile: No results for input(s): "INR", "PROTIME" in the last 168 hours. Thyroid  Function Tests: No results for input(s): "TSH", "T4TOTAL", "FREET4", "T3FREE", "THYROIDAB" in the last 72 hours. Lipid Profile: No results for input(s): "CHOL", "HDL", "LDLCALC", "TRIG", "CHOLHDL", "LDLDIRECT" in the last 72 hours. Anemia Panel: Recent Labs    06/20/23 0856 06/20/23 0900  VITAMINB12 554  --   FOLATE 5.3*  --   FERRITIN 185  --   TIBC 139*  --   IRON 33*  --   RETICCTPCT 2.3 2.4   Urine analysis:    Component Value Date/Time   COLORURINE YELLOW 04/15/2023 1732   APPEARANCEUR CLOUDY (A) 04/15/2023 1732   LABSPEC 1.014 04/15/2023 1732   PHURINE 5.0 04/15/2023 1732   GLUCOSEU >=500 (A) 04/15/2023 1732   HGBUR MODERATE (A) 04/15/2023 1732   BILIRUBINUR NEGATIVE 04/15/2023 1732   BILIRUBINUR negative 05/12/2018 1213   BILIRUBINUR small 06/29/2014 0859   KETONESUR NEGATIVE 04/15/2023 1732   PROTEINUR >=300 (A) 04/15/2023 1732   UROBILINOGEN 0.2 05/12/2018 1213   UROBILINOGEN 1.0 05/02/2014 2015   NITRITE NEGATIVE 04/15/2023 1732   LEUKOCYTESUR  NEGATIVE 04/15/2023 1732   Sepsis Labs: Invalid input(s): "PROCALCITONIN", "LACTICIDVEN"  Microbiology: Recent Results (from the past 240 hours)  Culture, blood (routine x 2)     Status: None (Preliminary result)   Collection Time: 06/19/23  8:23 PM   Specimen: BLOOD  Result Value Ref Range Status   Specimen Description BLOOD LEFT ANTECUBITAL  Final   Special Requests   Final    BOTTLES DRAWN AEROBIC AND ANAEROBIC Blood Culture results may not be optimal due to an inadequate volume of blood received in culture bottles   Culture   Final    NO GROWTH 3 DAYS Performed at [facility], [address] [place], [place] [address]    Report Status PENDING  Incomplete  Culture, blood (routine x 2)     Status: None (Preliminary result)   Collection Time: 06/20/23  1:14 AM   Specimen: BLOOD LEFT HAND  Result Value Ref Range Status   Specimen Description BLOOD LEFT HAND  Final   Special Requests   Final    BOTTLES DRAWN AEROBIC AND ANAEROBIC Blood Culture adequate volume   Culture   Final    NO GROWTH 2 DAYS Performed at [facility], [address], [place], [place] [address]    Report Status PENDING  Incomplete  Surgical pcr screen     Status: None   Collection Time:  06/21/23  5:53 AM   Specimen: Nasal Mucosa; Nasal Swab  Result Value Ref Range Status   MRSA, PCR NEGATIVE NEGATIVE Final   Staphylococcus aureus NEGATIVE NEGATIVE Final    Comment: (NOTE) The Xpert SA Assay (FDA approved for NASAL specimens in patients 42 years of age and older), is one component of a comprehensive surveillance program. It is not intended to diagnose infection nor to guide or monitor treatment. Performed at [facility], [address] [place], [place] [address]     Radiology Studies: No results found.     Beryl Balz T. Laquasha Groome [facility]  If 7PM-7AM, please contact night-coverage [email or web address] 06/22/2023, 3:52 PM

## 2023-06-23 DIAGNOSIS — I739 Peripheral vascular disease, unspecified: Secondary | ICD-10-CM | POA: Diagnosis not present

## 2023-06-23 DIAGNOSIS — I272 Pulmonary hypertension, unspecified: Secondary | ICD-10-CM | POA: Diagnosis not present

## 2023-06-23 DIAGNOSIS — T847XXA Infection and inflammatory reaction due to other internal orthopedic prosthetic devices, implants and grafts, initial encounter: Secondary | ICD-10-CM | POA: Diagnosis not present

## 2023-06-23 DIAGNOSIS — Z8619 Personal history of other infectious and parasitic diseases: Secondary | ICD-10-CM

## 2023-06-23 DIAGNOSIS — J449 Chronic obstructive pulmonary disease, unspecified: Secondary | ICD-10-CM | POA: Diagnosis not present

## 2023-06-23 DIAGNOSIS — L97314 Non-pressure chronic ulcer of right ankle with necrosis of bone: Secondary | ICD-10-CM | POA: Diagnosis not present

## 2023-06-23 LAB — CBC
HCT: 27.8 % — ABNORMAL LOW (ref 39.0–52.0)
Hemoglobin: 9 g/dL — ABNORMAL LOW (ref 13.0–17.0)
MCH: 29.2 pg (ref 26.0–34.0)
MCHC: 32.4 g/dL (ref 30.0–36.0)
MCV: 90.3 fL (ref 80.0–100.0)
Platelets: 126 10*3/uL — ABNORMAL LOW (ref 150–400)
RBC: 3.08 MIL/uL — ABNORMAL LOW (ref 4.22–5.81)
RDW: 15.3 % (ref 11.5–15.5)
WBC: 7.2 10*3/uL (ref 4.0–10.5)
nRBC: 0 % (ref 0.0–0.2)

## 2023-06-23 LAB — RENAL FUNCTION PANEL
Albumin: 1.8 g/dL — ABNORMAL LOW (ref 3.5–5.0)
Anion gap: 6 (ref 5–15)
BUN: 14 mg/dL (ref 8–23)
CO2: 26 mmol/L (ref 22–32)
Calcium: 8.6 mg/dL — ABNORMAL LOW (ref 8.9–10.3)
Chloride: 106 mmol/L (ref 98–111)
Creatinine, Ser: 1.9 mg/dL — ABNORMAL HIGH (ref 0.61–1.24)
GFR, Estimated: 38 mL/min — ABNORMAL LOW (ref >60)
Glucose, Bld: 122 mg/dL — ABNORMAL HIGH (ref 70–99)
Phosphorus: 2.8 mg/dL (ref 2.5–4.6)
Potassium: 4 mmol/L (ref 3.5–5.1)
Sodium: 138 mmol/L (ref 135–145)

## 2023-06-23 LAB — GLUCOSE, CAPILLARY
Glucose-Capillary: 115 mg/dL — ABNORMAL HIGH (ref 70–99)
Glucose-Capillary: 107 mg/dL — ABNORMAL HIGH (ref 70–99)
Glucose-Capillary: 116 mg/dL — ABNORMAL HIGH (ref 70–99)
Glucose-Capillary: 166 mg/dL — ABNORMAL HIGH (ref 70–99)

## 2023-06-23 MED ORDER — SODIUM CHLORIDE 0.9 % IV SOLN
1.0000 g | INTRAVENOUS | Status: DC
  Administered 2023-06-23 – 2023-06-28 (×6): 1 g via INTRAVENOUS
  Filled 2023-06-23 (×7): qty 1000

## 2023-06-23 NOTE — Plan of Care (Signed)
   Problem: Activity: Goal: Risk for activity intolerance will decrease Outcome: Progressing   Problem: Nutrition: Goal: Adequate nutrition will be maintained Outcome: Progressing   Problem: Coping: Goal: Level of anxiety will decrease Outcome: Progressing

## 2023-06-23 NOTE — TOC Initial Note (Signed)
 Transition of Care [facility]) - Initial/Assessment Note    Patient Details  Name: Harold [name] MRN: [id number] Date of Birth: 09-20-1955  [facility]) CM/SW Contact:    Katrinka Parr, LCSW Phone Number: 06/23/2023, 3:52 PM  Clinical Narrative:                  CSW met with pt to discuss SNF recs. Pt has been to [facility] and [facility] in the past. Does not want [facility] again. He is agreeable to SNF w/u. CSW explains workup process and snf auth process. Pt does express interest in CIR; CSW agrees to have CIR review pt for potential CIR admission. CSW request review form CIR admissions. Fl2 completed and SNF bed requests sent in hub. Pt request CSW call and update his spouse. CSW left  voicemail with spouse requesting return call.   1555: CIR reviewed pt and he does not meet med necessity. TOC to continue pursuing snf.   Expected Discharge Plan: Skilled Nursing Facility Barriers to Discharge: English as a second language teacher, SNF Pending bed offer, Continued Medical Work up       Prior Living Arrangements/Services     Patient language and need for interpreter reviewed:: Yes        Need for Family Participation in Patient Care: Yes (Comment) Care giver support system in place?: Yes (comment)   Criminal Activity/Legal Involvement Pertinent to Current Situation/Hospitalization: No - Comment as needed  Activities of Daily Living   ADL Screening (condition at time of admission) Independently performs ADLs?: No Does the patient have a NEW difficulty with bathing/dressing/toileting/self-feeding that is expected to last >3 days?: Yes (Initiates electronic notice to provider for possible OT consult) Does the patient have a NEW difficulty with getting in/out of bed, walking, or climbing stairs that is expected to last >3 days?: Yes (Initiates electronic notice to provider for possible PT consult) Does the patient have a NEW difficulty with communication that is expected to  last >3 days?: No Is the patient deaf or have difficulty hearing?: No Does the patient have difficulty seeing, even when wearing glasses/contacts?: No Does the patient have difficulty concentrating, remembering, or making decisions?: No  Permission Sought/Granted   Permission granted to share information with : Yes, Verbal Permission Granted  Share Information with NAME: Benton-Elliot,George (Spouse)  [phone] (Mobile)           Emotional Assessment Appearance:: Appears stated age Attitude/Demeanor/Rapport: Engaged Affect (typically observed): Accepting Orientation: : Oriented to Self, Oriented to Situation, Oriented to  Time, Oriented to Place Alcohol / Substance Use: Not Applicable Psych Involvement: No (comment)  Admission diagnosis:  Hardware complicating wound infection (HCC) [T84.7XXA] Hardware complicating wound infection, initial encounter (HCC) [W09.7XXA] 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, right (HCC) 03/28/2023   Abscess of skin of right ankle 03/28/2023   PAD (peripheral artery disease) (HCC) 03/28/2023   Vitamin D  deficiency 01/07/2023   Prolonged QT interval 01/02/2023   Acute kidney injury superimposed on stage 4 chronic kidney disease (HCC) 01/02/2023   Normocytic anemia 01/02/2023   Paroxysmal atrial fibrillation (  HCC) 01/02/2023   Uncontrolled type 2 diabetes mellitus with hyperglycemia, with long-term current use of insulin  (HCC) 01/02/2023   Acquired hypothyroidism 01/02/2023   CKD stage 3b, GFR 30-44 ml/min  (HCC) 11/07/2022   CAD in native artery 11/07/2022   PVD (peripheral vascular disease) (HCC) 11/06/2022   Hypercoagulable state due to persistent atrial fibrillation (HCC) 02/19/2022   Persistent atrial fibrillation (HCC) 01/28/2022   Lumbar stenosis with neurogenic claudication 07/26/2020   Diabetic neuropathy (HCC) 08/24/2019   Trigger thumb of left hand 09/02/2018   Atherosclerosis of native artery of both lower extremities with intermittent claudication (HCC) 05/12/2018   Pain of left hand 03/26/2018   Trigger finger 03/03/2017   Pain in finger of left hand 03/03/2017   Snoring 08/20/2016   Morbid (severe) obesity due to excess calories (HCC) 04/18/2016   Venous stasis of both lower extremities - with edema 02/25/2015   Right-sided chest wall pain 05/08/2014   Gastroesophageal reflux disease without esophagitis 10/10/2013   Chronic heart failure with preserved ejection fraction (HFpEF) (HCC) 06/27/2013   COPD (chronic obstructive pulmonary disease) (HCC) 06/17/2013   Restrictive lung disease 06/17/2013   Obesity, Class III, BMI 40-49.9 (morbid obesity) 09/01/2012   Generalized weakness 09/01/2012   Chronic renal disease, stage IV (HCC) 04/30/2012   OSA on CPAP 04/29/2012   Pulmonary hypertension, 04/29/2012   Dyspnea on exertion   04/26/2012   CAD, LAD/CFX DES 04/28/12- staged RCA DES 04/29/12 after pt declined CABG, cath 05/14/13 stable CAD medical therapy    Carotid artery stenosis without cerebral infarction, bilateral 11/07/2011   Diabetes mellitus type 2 with neurological manifestations (HCC) 04/29/2011   Essential hypertension 04/29/2011   Neuropathy 04/29/2011   Hyperlipidemia associated with type 2 diabetes mellitus (HCC) 04/29/2011   Anxiety and depression 04/29/2011   PCP:  Tita Form, MD Pharmacy:   [facility] [phone] - [place], [place] - [address] [place] [address] Phone: [phone] Fax: [phone]     Social Drivers of  Health (SDOH) Social History: SDOH Screenings   Food Insecurity: No Food Insecurity (06/20/2023)  Housing: Low Risk  (06/20/2023)  Transportation Needs: No Transportation Needs (06/20/2023)  Utilities: Not At Risk (06/20/2023)  Social Connections: Moderately Integrated (06/20/2023)  Tobacco Use: Medium Risk (06/21/2023)   SDOH Interventions:     Readmission Risk Interventions     No data to display

## 2023-06-23 NOTE — Progress Notes (Signed)
 PHARMACY CONSULT NOTE FOR:  OUTPATIENT  PARENTERAL ANTIBIOTIC THERAPY (OPAT)  Indication: Hardware associated ankle osteomyelitis  Regimen: Daptomycin  800 mg IV every  24 hours + Ertapenem  1 gm IV every 24 hours  End date: 08/02/23  IV antibiotic discharge orders are pended. To discharging provider:  please sign these orders via discharge navigator,  Select New Orders & click on the button choice - Manage This Unsigned Work.     Thank you for allowing pharmacy to be a part of this patient's care.  Denson Flake, PharmD, BCPS, BCIDP Infectious Diseases Clinical Pharmacist Phone: [phone] 06/23/2023, 9:43 AM

## 2023-06-23 NOTE — Progress Notes (Signed)
 Orthopaedic Trauma Progress Note  SUBJECTIVE: Doing okay this morning.  Pain in the foot controlled. Has been able to stand with therapies and hasn't noted any issues with orthostatic hypotension or seizure-like activity.  He notes these were both significant issues at home and limited his ability to mobilize prior to hospital admission.  No chest pain. No SOB. No nausea/vomiting. No other complaints.  Tolerating diet and fluids.  OBJECTIVE:  Vitals:   06/23/23 0504 06/23/23 0757  BP: (!) 176/69 (!) 151/58  Pulse: 63 67  Resp: 16   Temp: 98.3 F (36.8 C) 97.7 F (36.5 C)  SpO2: 100% 98%    Opiates Today (MME): Today's  total administered Morphine  Milligram Equivalents: 0 Opiates Yesterday (MME): Yesterday's total administered Morphine  Milligram Equivalents: 22.5  General: Sitting up in bed, no acute distress.  Pleasant and cooperative Respiratory: No increased work of breathing.  Operative Extremity (right lower extremity): Dressing clean, dry, intact.  Endorses sensation of light touch over the toes although diminished.  Able to wiggle the toes.  Toes warm well-perfused.  Some soreness to the calf but no areas of significant tenderness.  IMAGING: Intraoperative imaging shows removal of all screws from the calcaneus.  Remainder of hardware appears stable  LABS:  Results for orders placed or performed during the hospital encounter of 06/19/23 (from the past 24 hours)  Glucose, capillary     Status: Abnormal   Collection Time: 06/22/23 11:54 AM  Result Value Ref Range   Glucose-Capillary 131 (H) 70 - 99 mg/dL  Glucose, capillary     Status: Abnormal   Collection Time: 06/22/23  4:28 PM  Result Value Ref Range   Glucose-Capillary 155 (H) 70 - 99 mg/dL  Glucose, capillary     Status: Abnormal   Collection Time: 06/22/23 10:13 PM  Result Value Ref Range   Glucose-Capillary 132 (H) 70 - 99 mg/dL  Glucose, capillary     Status: Abnormal   Collection Time: 06/23/23  6:13 AM  Result  Value Ref Range   Glucose-Capillary 115 (H) 70 - 99 mg/dL  CBC     Status: Abnormal   Collection Time: 06/23/23  6:54 AM  Result Value Ref Range   WBC 7.2 4.0 - 10.5 K/uL   RBC 3.08 (L) 4.22 - 5.81 MIL/uL   Hemoglobin 9.0 (L) 13.0 - 17.0 g/dL   HCT 16.1 (L) 09.6 - 04.5 %   MCV 90.3 80.0 - 100.0 fL   MCH 29.2 26.0 - 34.0 pg   MCHC 32.4 30.0 - 36.0 g/dL   RDW 40.9 81.1 - 91.4 %   Platelets 126 (L) 150 - 400 K/uL   nRBC 0.0 0.0 - 0.2 %   *Note: Due to a large number of results and/or encounters for the requested time period, some results have not been displayed. A complete set of results can be found in Results Review.    ASSESSMENT: Harold [name] is a 68 y.o. male, 2 Days Post-Op s/p REMOVAL OF HARDWARE RIGHT FOOT  CV/Blood loss: Acute blood loss anemia, Hgb 9.0 this morning.  Stable and trending up.  Hemodynamically stable  PLAN: Weightbearing: WBAT RLE ROM: Unrestricted motion of the knee and ankle Incisional and dressing care: Daily dressing changes as needed starting today Showering: Keep dressing dry when showering. Orthopedic device(s): None  Pain management:  1. Tylenol  650 mg q 6 hours PRN 2. Robaxin  500 mg q 6 hours PRN 3. Oxycodone  5 mg q 4 hours PRN 4. Dilaudid  0.5 mg q  2 hours PRN VTE prophylaxis: Plavix , Eliquis . SCDs ID: Antibiotics per infectious disease Foley/Lines:  No foley, KVO IVFs Impediments to Fracture Healing: DM, CKD, venous insufficiency.  Vitamin D  deficiency Dispo: PT/OT evaluation ongoing, recommending SNF prior to returning home.  Patient feels this is unlikely to be approved by insurance.  Okay for discharge from ortho standpoint once cleared by medicine team and therapies  D/C recommendations: - Oxycodone , Robaxin , Tylenol  for pain control - Continue home dose Plavix  and Eliquis  for DVT prophylaxis - Continue home dose Vit D supplementation  Follow - up plan: 2 weeks after d/c for wound check and repeat x-rays   Contact information:   Katheryne Pane MD, Alona Jamaica PA-C. After hours and holidays please check [email or web address] for group call information for [facility]   Edilia Gordon, PA-C [phone] (office) [email or web address]

## 2023-06-23 NOTE — Progress Notes (Signed)
 Inpatient Rehab Admissions Coordinator:   Per therapy recommendations, patient was screened for CIR candidacy by Wandalee Gust, MS, CCC-SLP. At this time, Pt. does not appear to demonstrate medical necessity to justify in hospital rehabilitation/CIR. will not pursue a rehab consult for this Pt.  Additionally, pt. With poor tolerance, appears more appropriate for SNF level rehab.  Recommend other rehab venues to be pursued.  Please contact me with any questions.  Wandalee Gust, MS, CCC-SLP Rehab Admissions Coordinator  [phone] (celll) [phone] (office)

## 2023-06-23 NOTE — Care Management Important Message (Signed)
 Important Message  Patient Details  Name: Harold [name] MRN: [id number] Date of Birth: [birth year]   Important Message Given:  Yes - Medicare IM     Felix Host 06/23/2023, 4:00 PM

## 2023-06-23 NOTE — TOC Progression Note (Addendum)
 Transition of Care [facility]) - Progression Note    Patient Details  Name: Harold [name] MRN: [id number] Date of Birth: 15-Sep-1955  Transition of Care [facility]) CM/SW Contact  Katrinka Parr, [place] Phone Number: 06/23/2023, 4:30 PM  Clinical Narrative:     CSW spoke with pt's spouse regarding SNF recs. Explained that CIR noted that pt does not meet criteria. Spouse is adamant about pursuing [facility]. He is willing to go through any appeals necessary for LTACH. He states that when pursuing SNF previously, all of the SNFs required large payments upfront for copay days which they could not afford. Pt is still in copay days for SNF. Pt participated in [facility] program during previous admission and did very well. Spouse expresses that he feels his concerns are brushed off by providers. He has concerns about the current abx pt is currently on causing health issues in the past. He also has concerns about pt having confusion and not remembering working with PT at times as well as not knowing he was in the hospital. He is interested in a neuro workup and was told that couldn't happen in the hospital. Spouse states he has already spoken with providers about these concerns. CSW has provided spouse with contact number for patient experience. Plan to consider [facility] as option.   Expected Discharge Plan: Skilled Nursing Facility Barriers to Discharge: Insurance Authorization, SNF Pending bed offer, Continued Medical Work up  Expected Discharge Plan and Services                                               Social Determinants of Health (SDOH) Interventions SDOH Screenings   Food Insecurity: No Food Insecurity (06/20/2023)  Housing: Low Risk  (06/20/2023)  Transportation Needs: No Transportation Needs (06/20/2023)  Utilities: Not At Risk (06/20/2023)  Social Connections: Moderately Integrated (06/20/2023)  Tobacco Use: Medium Risk (06/21/2023)    Readmission Risk Interventions     No data to  display

## 2023-06-23 NOTE — NC FL2 (Signed)
 [place]  MEDICAID FL2 LEVEL OF CARE FORM     IDENTIFICATION  Patient Name: Harold [name] Birthdate: 08-28-1955 Sex: male Admission Date (Current Location): 06/19/2023  [facility] and [place] Number:  Producer, television/film/video and Address:  [facility] [place], [address] [place], [place] [address]      Provider Number: [id number]  Attending Physician Name and Address:  Theadore Finger, MD  Relative Name and Phone Number:       Current Level of Care:   Recommended Level of Care: Skilled Nursing Facility Prior Approval Number:    Date Approved/Denied:   PASRR Number: [id number]  Discharge Plan: SNF    Current Diagnoses: 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, right (HCC) 03/28/2023   Abscess of skin of right ankle 03/28/2023   PAD (peripheral artery disease) (HCC) 03/28/2023   Vitamin D  deficiency 01/07/2023   Prolonged QT interval 01/02/2023   Acute kidney injury superimposed on stage 4 chronic kidney disease (HCC) 01/02/2023   Normocytic anemia 01/02/2023   Paroxysmal atrial fibrillation (HCC) 01/02/2023   Uncontrolled type 2 diabetes mellitus with hyperglycemia, with long-term current use of insulin  (HCC) 01/02/2023   Acquired hypothyroidism 01/02/2023   CKD stage 3b, GFR 30-44 ml/min (HCC) 11/07/2022   CAD in native artery 11/07/2022   PVD (peripheral vascular disease) (HCC) 11/06/2022   Hypercoagulable state due to persistent atrial fibrillation (HCC) 02/19/2022   Persistent atrial fibrillation (HCC)  01/28/2022   Lumbar stenosis with neurogenic claudication 07/26/2020   Diabetic neuropathy (HCC) 08/24/2019   Trigger thumb of left hand 09/02/2018   Atherosclerosis of native artery of both lower extremities with intermittent claudication (HCC) 05/12/2018   Pain of left hand 03/26/2018   Trigger finger 03/03/2017   Pain in finger of left hand 03/03/2017   Snoring 08/20/2016   Morbid (severe) obesity due to excess calories (HCC) 04/18/2016   Venous stasis of both lower extremities - with edema 02/25/2015   Right-sided chest wall pain 05/08/2014   Gastroesophageal reflux disease without esophagitis 10/10/2013   Chronic heart failure with preserved ejection fraction (HFpEF) (HCC) 06/27/2013   COPD (chronic obstructive pulmonary disease) (HCC) 06/17/2013   Restrictive lung disease 06/17/2013   Obesity, Class III, BMI 40-49.9 (morbid obesity) 09/01/2012   Generalized weakness 09/01/2012   Chronic renal disease, stage IV (HCC) 04/30/2012   OSA on CPAP 04/29/2012   Pulmonary hypertension, 04/29/2012   Dyspnea on exertion   04/26/2012   CAD, LAD/CFX DES 04/28/12- staged RCA DES 04/29/12 after pt declined CABG, cath 05/14/13 stable CAD medical therapy    Carotid artery stenosis without cerebral infarction, bilateral 11/07/2011   Diabetes mellitus type 2 with neurological manifestations (HCC) 04/29/2011   Essential hypertension 04/29/2011   Neuropathy 04/29/2011   Hyperlipidemia associated with type 2 diabetes mellitus (HCC) 04/29/2011   Anxiety and depression 04/29/2011    Orientation RESPIRATION BLADDER Height & Weight     Self, Situation, Time, Place  Normal Incontinent Weight: 274 lb 7.6 oz (124.5 kg) Height:  6' 0.99" (185.4 cm)  BEHAVIORAL SYMPTOMS/MOOD NEUROLOGICAL BOWEL  NUTRITION STATUS      Continent Diet (see d/c summary)  AMBULATORY STATUS COMMUNICATION OF NEEDS Skin   Extensive Assist Verbally PU Stage and Appropriate Care (pressure injury sacrum right unstageable;  pressure injury buttocks left stage 2; incision ankle left)                       Personal Care Assistance Level of Assistance  Bathing, Feeding, Dressing Bathing Assistance: Maximum assistance Feeding assistance: Independent Dressing Assistance: Maximum assistance     Functional Limitations Info  Sight, Hearing, Speech Sight Info: Impaired Hearing Info: Adequate Speech Info: Adequate    SPECIAL CARE FACTORS FREQUENCY  OT (By licensed OT), PT (By licensed PT)     PT Frequency: 5x/week OT Frequency: 5x/week            Contractures Contractures Info: Not present    Additional Factors Info  Code Status, Allergies Code Status Info: full code Allergies Info: Beta-lactamase Inhibitors (beta-lactam, metformin  and related, Wellbutrin (bupropion), sulfa antibiotics, Tetracyclines & Related, General Anesthesia - sometimes causes nausea and vomiting * OK to use scopolamine  patch           Current Medications (06/23/2023):  This is the current hospital active medication list Current Facility-Administered Medications  Medication Dose Route Frequency Provider Last Rate Last Admin   acetaminophen  (TYLENOL ) tablet 650 mg  650 mg Oral Q6H PRN Versie Gores, PA-C   650 mg at 06/20/23 1418   Or   acetaminophen  (TYLENOL ) suppository 650 mg  650 mg Rectal Q6H PRN Versie Gores, PA-C       allopurinol  (ZYLOPRIM ) tablet 50 mg  50 mg Oral Daily Alona Jamaica A, PA-C   50 mg at 06/23/23 0865   amiodarone  (PACERONE ) tablet 200 mg  200 mg Oral BID Versie Gores, PA-C   200 mg at 06/23/23 7846   apixaban  (ELIQUIS ) tablet 5 mg  5 mg Oral BID Versie Gores, PA-C   5 mg at 06/23/23 9629   atorvastatin  (LIPITOR) tablet 40 mg  40 mg Oral QHS Gonfa, Taye T, MD   40 mg at 06/22/23 2104   cholecalciferol  (VITAMIN D3) 25 MCG (1000 UNIT) tablet 2,000 Units  2,000 Units Oral Daily Gonfa, Taye T, MD   2,000 Units at 06/23/23 0955   clopidogrel  (PLAVIX ) tablet 75 mg  75 mg Oral Daily Versie Gores, PA-C   75 mg at 06/23/23 5284   DAPTOmycin  (CUBICIN ) 800 mg in sodium chloride  0.9 % IVPB  8 mg/kg (Adjusted) Intravenous Q1400 Versie Gores, PA-C   Stopped at 06/23/23 1345   docusate sodium  (COLACE) capsule 100 mg  100 mg Oral BID Versie Gores, PA-C   100 mg at 06/22/23 2104   DULoxetine  (CYMBALTA ) DR capsule 60 mg  60 mg Oral BID Versie Gores, PA-C   60 mg at 06/23/23 1324   ertapenem  (INVANZ ) 1 g in sodium chloride  0.9 % 100 mL IVPB  1 g Intravenous Q24H Orlie Bjornstad, MD   Stopped at 06/23/23 1039   ezetimibe  (ZETIA ) tablet 10 mg  10 mg Oral Daily Versie Gores, PA-C   10 mg at 06/23/23 4010   ferrous sulfate  tablet 325 mg  325 mg Oral Once per day on Monday Wednesday Friday Versie Gores, PA-C   325 mg at 06/23/23 1315   gabapentin  (NEURONTIN ) capsule 300 mg  300 mg Oral QHS Versie Gores, PA-C   300 mg at 06/22/23 2103  Gerhardt's butt cream   Topical BID Versie Gores, PA-C   Given at 06/23/23 4098   HYDROmorphone  (DILAUDID ) injection 0.5 mg  0.5 mg Intravenous Q2H PRN Versie Gores, PA-C   0.5 mg at 06/21/23 1310   insulin  aspart (novoLOG ) injection 0-15 Units  0-15 Units Subcutaneous TID WC Gonfa, Taye T, MD   3 Units at 06/22/23 1638   insulin  aspart (novoLOG ) injection 0-5 Units  0-5 Units Subcutaneous QHS Versie Gores, PA-C   3 Units at 06/21/23 2205   insulin  glargine-yfgn (SEMGLEE ) injection 15 Units  15 Units Subcutaneous QHS Gonfa, Taye T, MD   15 Units at 06/22/23 2105   leptospermum manuka honey (MEDIHONEY) paste 1 Application  1 Application Topical Daily Versie Gores, PA-C   1 Application at 06/23/23 1191   levothyroxine  (SYNTHROID ) tablet 88 mcg  88 mcg Oral QAC breakfast Versie Gores, PA-C   88 mcg at 06/23/23 0503   loratadine  (CLARITIN ) tablet 10 mg  10 mg Oral Daily Versie Gores, PA-C   10 mg at 06/23/23 4782   magnesium  oxide (MAG-OX) tablet 400 mg  400 mg Oral Daily Versie Gores, PA-C   400 mg at 06/23/23 9562    methocarbamol  (ROBAXIN ) tablet 500 mg  500 mg Oral Q6H PRN Versie Gores, PA-C   500 mg at 06/22/23 2104   metoCLOPramide  (REGLAN ) tablet 5-10 mg  5-10 mg Oral Q8H PRN Versie Gores, PA-C       Or   metoCLOPramide  (REGLAN ) injection 5-10 mg  5-10 mg Intravenous Q8H PRN Jonelle Neri, Sarah A, PA-C       multivitamin with minerals tablet 1 tablet  1 tablet Oral Daily Versie Gores, PA-C   1 tablet at 06/23/23 1308   nitroGLYCERIN  (NITROSTAT ) SL tablet 0.4 mg  0.4 mg Sublingual Q5 min PRN Versie Gores, PA-C       oxyCODONE  (Oxy IR/ROXICODONE ) immediate release tablet 5 mg  5 mg Oral Q4H PRN Versie Gores, PA-C   5 mg at 06/22/23 2105   pantoprazole  (PROTONIX ) EC tablet 40 mg  40 mg Oral Daily Versie Gores, PA-C   40 mg at 06/23/23 0954   polyethylene glycol (MIRALAX  / GLYCOLAX ) packet 17 g  17 g Oral Daily PRN Versie Gores, PA-C       tamsulosin  (FLOMAX ) capsule 0.4 mg  0.4 mg Oral Daily Versie Gores, PA-C   0.4 mg at 06/23/23 6578     Discharge Medications: Please see discharge summary for a list of discharge medications.  Relevant Imaging Results:  Relevant Lab Results:   Additional Information SSN; [id number] 6 weeks IV ABx: Daptomycin  800 mg IV every 24 hours and ertapenem  1 g every 24 hours  Jadian Karman [employer], LCSW

## 2023-06-23 NOTE — Progress Notes (Signed)
 PROGRESS NOTE  Harold [name] UJW:[id number] DOB: 1955-03-11   PCP: Amin, Saad, MD  Patient is from: Home.  Lives with significant other.  DOA: 06/19/2023 LOS: 4  Chief complaints Chief Complaint  Patient presents with   Post-op Problem     Brief Narrative / Interim history: 68 year old M with PMH of CAD/PCI, CKD-4, COPD, PAH, OSA, DM with neuropathy, chronic venous insufficiency, orthostatic hypotension, HLD and diabetic foot ulcer, traumatic right ankle fracture in 12/2022 requiring hardware placement complicated by need for multiple surgeries and infection with Enterobacter cloacae bacteremia and recent right ankle arthrodesis on 3/27 presenting with pain, bleeding and exposed hardware.  No report of fever or constitutional symptoms.  He was on ciprofloxacin  outpatient.  In ED, stable vitals.  No leukocytosis.  CRP 5.0. Cr 2.18 (above baseline).  Hgb 8.8 (above baseline).  Right ankle x-ray showed postoperative changes with surgical hardware unchanged from prior, mildly displaced fracture deformity of the posterior tibial malleolus, degenerative changes in the ankle joint and first MPJ and soft tissue swelling over the dorsum of the foot.  Blood cultures obtained.  Started on broad-spectrum antibiotics.  Orthopedic surgery consulted   The next day, blood cultures NGTD.  Hgb dropped to 7.3.  Transfused 1 unit with appropriate response.  Underwent hardware removal of right foot on 5/3.    Remains on daptomycin  and ertapenem  per ID.  Therapy recommended SNF.  Subjective: Seen and examined earlier this morning.  No major events overnight of this morning.  No complaints. Objective: Vitals:   06/23/23 0504 06/23/23 0757 06/23/23 1410 06/23/23 1411  BP: (!) 176/69 (!) 151/58 (!) 162/86 (!) 162/86  Pulse: 63 67 71 71  Resp: 16  19 19   Temp: 98.3 F (36.8 C) 97.7 F (36.5 C) 97.7 F (36.5 C) 97.7 F (36.5 C)  TempSrc: Oral Oral Oral Oral  SpO2: 100% 98% 96% 96%  Weight:       Height:        Examination:  GENERAL: No apparent distress.  Nontoxic. HEENT: MMM.  Vision and hearing grossly intact.  NECK: Supple.  No apparent JVD.  RESP:  No IWOB.  Fair aeration bilaterally. CVS:  RRR. Heart sounds normal.  ABD/GI/GU: BS+. Abd soft, NTND.  MSK/EXT:  Moves extremities.  Ace wrap over right foot. SKIN: Ace wrap over right foot. NEURO: Awake, alert and oriented appropriately.  No apparent focal neuro deficit. PSYCH: Calm. Normal affect.   Consultants:  Orthopedic surgery  Procedures: 5/3-right foot/ankle hardware removal by Dr. Curtiss Dowdy  Microbiology summarized: Blood cultures NGTD  Assessment and plan: Right ankle hardware infection: Patient with traumatic right ankle fracture in 12/2022 requiring hardware placement complicated by need for multiple surgeries and infection with Enterobacter cloacae bacteremia and recent right ankle arthrodesis on 3/27.  Seems  to be on Cipro  outpatient. -S/p right ankle hardware removal by Dr. Curtiss Dowdy on 5/3 -On daptomycin  and ertapenem  per ID. -ID and orthopedic surgery on board -Pain control per Ortho -On home Plavix  and Eliquis  which would serve VTE prophylaxis -WBAT on RLE per Ortho -PT/OT-recommended SNF.  IDDM-2 with hyperglycemia, neuropathy and diabetic ulcer: A1c 7.0% in 12/2022 Recent Labs  Lab 06/22/23 1154 06/22/23 1628 06/22/23 2213 06/23/23 0613 06/23/23 1152  GLUCAP 131* 155* 132* 115* 107*  -Continue Semglee  15 units at night -Continue SSI-moderate -Continue home Cymbalta , gabapentin  and Lipitor -Repeat A1c not reliable after transfusion.   CKD-4: At baseline Recent Labs    05/18/23 0809 05/19/23 0759 05/20/23 0614 05/21/23 0504 05/26/23  1420 06/19/23 2023 06/20/23 0114 06/20/23 0115 06/21/23 1610 06/22/23 0546 06/23/23 0959  BUN 30* 27* 26* 26* 33* 17  --  18 15 16 14   CREATININE 2.83* 2.76* 2.62* 2.38* 2.47* 2.18* 2.12* 2.14* 2.05* 2.14* 1.90*  - Continue monitoring  Paroxysmal  A-fib: Rate controlled -Continue amiodarone  and Eliquis    pulmonary hypertension/OSA - CPAP at night - Minimize sedating medications   Orthostatic hypotension: Chronic.  Resting BP elevated. - Discontinue midodrine  - Elevate head of bed to > 30 degrees - OOB/PT/OT/fall precaution  Anemia of renal disease: Hgb dropped to 7.3 on 5/2.  Transfused 1 units with appropriate response.  Baseline Hgb about 8.0.  No nutritional deficiency on anemia panel Recent Labs    05/18/23 0809 05/19/23 0759 05/20/23 0614 05/26/23 1420 05/26/23 1427 06/19/23 2023 06/20/23 0114 06/21/23 9604 06/22/23 0546 06/23/23 0654  HGB 7.7* 7.5* 8.6* 10.0* 9.5* 8.8* 7.3* 8.7* 8.2* 9.0*  -Continue monitoring    PAD: On Plavix  and Lipitor - Continue home Lipitor and Plavix    Hypothyroidism:  -Continue levothyroxine   BPH - Continue home Flomax   GERD - Continue PPI  History of gout -Continue allopurinol    Class II obesity Body mass index is 36.22 kg/m.  Pressure skin injury: Present on arrival Pressure Injury 06/20/23 Sacrum Right Unstageable - Full thickness tissue loss in which the base of the injury is covered by slough (yellow, tan, gray, green or brown) and/or eschar (tan, brown or black) in the wound bed. (Active)  06/20/23 0050  Location: Sacrum  Location Orientation: Right  Staging: Unstageable - Full thickness tissue loss in which the base of the injury is covered by slough (yellow, tan, gray, green or brown) and/or eschar (tan, brown or black) in the wound bed.  Wound Description (Comments):   Present on Admission: Yes  Dressing Type Foam - Lift dressing to assess site every shift 06/20/23 0358   DVT prophylaxis:  SCDs Start: 06/21/23 0914 apixaban  (ELIQUIS ) tablet 5 mg  Code Status: Full code Family Communication: None at bedside today.  Updated significant other at bedside on 5/3 Level of care: Med-Surg Status is: Inpatient Remains inpatient appropriate because: Right ankle  hardware infection   Final disposition: SNF   55 minutes with more than 50% spent in reviewing records, counseling patient/family and coordinating care.   Sch Meds:  Scheduled Meds:  allopurinol   50 mg Oral Daily   amiodarone   200 mg Oral BID   apixaban   5 mg Oral BID   atorvastatin   40 mg Oral QHS   cholecalciferol   2,000 Units Oral Daily   clopidogrel   75 mg Oral Daily   docusate sodium   100 mg Oral BID   DULoxetine   60 mg Oral BID   ezetimibe   10 mg Oral Daily   ferrous sulfate   325 mg Oral Once per day on Monday Wednesday Friday   gabapentin   300 mg Oral QHS   Gerhardt's butt cream   Topical BID   insulin  aspart  0-15 Units Subcutaneous TID WC   insulin  aspart  0-5 Units Subcutaneous QHS   insulin  glargine-yfgn  15 Units Subcutaneous QHS   leptospermum manuka honey  1 Application Topical Daily   levothyroxine   88 mcg Oral QAC breakfast   loratadine   10 mg Oral Daily   magnesium  oxide  400 mg Oral Daily   multivitamin with minerals  1 tablet Oral Daily   pantoprazole   40 mg Oral Daily   tamsulosin   0.4 mg Oral Daily   Continuous Infusions:  DAPTOmycin  800 mg (06/23/23 1315)   ertapenem  1 g (06/23/23 1009)   PRN Meds:.acetaminophen  **OR** acetaminophen , HYDROmorphone  (DILAUDID ) injection, methocarbamol , metoCLOPramide  **OR** metoCLOPramide  (REGLAN ) injection, nitroGLYCERIN , oxyCODONE , polyethylene glycol  Antimicrobials: Anti-infectives (From admission, onward)    Start     Dose/Rate Route Frequency Ordered Stop   06/23/23 1030  ertapenem  (INVANZ ) 1 g in sodium chloride  0.9 % 100 mL IVPB        1 g 200 mL/hr over 30 Minutes Intravenous Every 24 hours 06/23/23 0928     06/21/23 2200  DAPTOmycin  (CUBICIN ) 800 mg in sodium chloride  0.9 % IVPB        8 mg/kg  97.7 kg (Adjusted) 132 mL/hr over 30 Minutes Intravenous Daily 06/20/23 1005     06/21/23 0716  ceFAZolin  (ANCEF ) 2-4 GM/100ML-% IVPB       Note to Pharmacy: Darrell Else D: cabinet override      06/21/23  0716 06/21/23 0757   06/21/23 0715  ceFAZolin  (ANCEF ) IVPB 2g/100 mL premix        2 g 200 mL/hr over 30 Minutes Intravenous  Once 06/21/23 0706 06/21/23 0753   06/20/23 0045  ciprofloxacin  (CIPRO ) tablet 500 mg  Status:  Discontinued        500 mg Oral 2 times daily 06/19/23 2358 06/20/23 1001   06/19/23 2316  vancomycin  (VANCOREADY) IVPB 2000 mg/400 mL  Status:  Discontinued        2,000 mg 200 mL/hr over 120 Minutes Intravenous Every 48 hours 06/19/23 2316 06/20/23 1005   06/19/23 2315  ceFEPIme  (MAXIPIME ) 2 g in sodium chloride  0.9 % 100 mL IVPB  Status:  Discontinued        2 g 200 mL/hr over 30 Minutes Intravenous Every 12 hours 06/19/23 2311 06/23/23 0928   06/19/23 2245  cefTRIAXone  (ROCEPHIN ) 2 g in sodium chloride  0.9 % 100 mL IVPB  Status:  Discontinued       Placed in "And" Linked Group   2 g 200 mL/hr over 30 Minutes Intravenous Once 06/19/23 2241 06/19/23 2311   06/19/23 2245  metroNIDAZOLE  (FLAGYL ) IVPB 500 mg       Placed in "And" Linked Group   500 mg 100 mL/hr over 60 Minutes Intravenous  Once 06/19/23 2241 06/20/23 0014   06/19/23 2245  vancomycin  (VANCOCIN ) IVPB 1000 mg/200 mL premix  Status:  Discontinued       Placed in "And" Linked Group   1,000 mg 200 mL/hr over 60 Minutes Intravenous  Once 06/19/23 2241 06/19/23 2243   06/19/23 2245  vancomycin  (VANCOREADY) IVPB 2000 mg/400 mL  Status:  Discontinued        2,000 mg 200 mL/hr over 120 Minutes Intravenous  Once 06/19/23 2244 06/19/23 2316        I have personally reviewed the following labs and images: CBC: Recent Labs  Lab 06/19/23 2023 06/20/23 0114 06/21/23 0633 06/22/23 0546 06/23/23 0654  WBC 4.9 4.2 4.8 6.7 7.2  NEUTROABS 2.9  --   --   --   --   HGB 8.8* 7.3* 8.7* 8.2* 9.0*  HCT 27.0* 22.1* 26.7* 25.3* 27.8*  MCV 89.1 88.0 88.4 89.4 90.3  PLT 158 128* 135* 132* 126*   BMP &GFR Recent Labs  Lab 06/19/23 2023 06/20/23 0114 06/20/23 0115 06/21/23 0633 06/22/23 0546 06/23/23 0959  NA  132*  --  136 137 135 138  K 4.4  --  4.1 3.8 3.8 4.0  CL 101  --  104 108 106 106  CO2 20*  --  22 22 22 26   GLUCOSE 159*  --  149* 133* 204* 122*  BUN 17  --  18 15 16 14   CREATININE 2.18* 2.12* 2.14* 2.05* 2.14* 1.90*  CALCIUM  8.3*  --  8.1* 8.2* 8.2* 8.6*  MG  --   --   --  1.7 1.8  --   PHOS  --   --   --  3.1  --  2.8   Estimated Creatinine Clearance: 50.7 mL/min (A) (by C-G formula based on SCr of 1.9 mg/dL (H)). Liver & Pancreas: Recent Labs  Lab 06/19/23 2023 06/20/23 0115 06/21/23 0633 06/23/23 0959  AST 34 18  --   --   ALT 9 12  --   --   ALKPHOS 111 94  --   --   BILITOT 1.3* 0.6  --   --   PROT 5.4* 4.7*  --   --   ALBUMIN  2.3* 2.0* 2.0* 1.8*   No results for input(s): "LIPASE", "AMYLASE" in the last 168 hours. No results for input(s): "AMMONIA" in the last 168 hours. Diabetic: No results for input(s): "HGBA1C" in the last 72 hours. Recent Labs  Lab 06/22/23 1154 06/22/23 1628 06/22/23 2213 06/23/23 0613 06/23/23 1152  GLUCAP 131* 155* 132* 115* 107*   Cardiac Enzymes: Recent Labs  Lab 06/21/23 0633  CKTOTAL 36*   No results for input(s): "PROBNP" in the last 8760 hours. Coagulation Profile: No results for input(s): "INR", "PROTIME" in the last 168 hours. Thyroid  Function Tests: No results for input(s): "TSH", "T4TOTAL", "FREET4", "T3FREE", "THYROIDAB" in the last 72 hours. Lipid Profile: No results for input(s): "CHOL", "HDL", "LDLCALC", "TRIG", "CHOLHDL", "LDLDIRECT" in the last 72 hours. Anemia Panel: No results for input(s): "VITAMINB12", "FOLATE", "FERRITIN", "TIBC", "IRON", "RETICCTPCT" in the last 72 hours.  Urine analysis:    Component Value Date/Time   COLORURINE YELLOW 04/15/2023 1732   APPEARANCEUR CLOUDY (A) 04/15/2023 1732   LABSPEC 1.014 04/15/2023 1732   PHURINE 5.0 04/15/2023 1732   GLUCOSEU >=500 (A) 04/15/2023 1732   HGBUR MODERATE (A) 04/15/2023 1732   BILIRUBINUR NEGATIVE 04/15/2023 1732   BILIRUBINUR negative  05/12/2018 1213   BILIRUBINUR small 06/29/2014 0859   KETONESUR NEGATIVE 04/15/2023 1732   PROTEINUR >=300 (A) 04/15/2023 1732   UROBILINOGEN 0.2 05/12/2018 1213   UROBILINOGEN 1.0 05/02/2014 2015   NITRITE NEGATIVE 04/15/2023 1732   LEUKOCYTESUR NEGATIVE 04/15/2023 1732   Sepsis Labs: Invalid input(s): "PROCALCITONIN", "LACTICIDVEN"  Microbiology: Recent Results (from the past 240 hours)  Culture, blood (routine x 2)     Status: None (Preliminary result)   Collection Time: 06/19/23  8:23 PM   Specimen: BLOOD  Result Value Ref Range Status   Specimen Description BLOOD LEFT ANTECUBITAL  Final   Special Requests   Final    BOTTLES DRAWN AEROBIC AND ANAEROBIC Blood Culture results may not be optimal due to an inadequate volume of blood received in culture bottles   Culture   Final    NO GROWTH 4 DAYS Performed at [facility], [address] [place], [place] [address]    Report Status PENDING  Incomplete  Culture, blood (routine x 2)     Status: None (Preliminary result)   Collection Time: 06/20/23  1:14 AM   Specimen: BLOOD LEFT HAND  Result Value Ref Range Status   Specimen Description BLOOD LEFT HAND  Final   Special Requests   Final    BOTTLES DRAWN AEROBIC AND ANAEROBIC Blood  Culture adequate volume   Culture   Final    NO GROWTH 3 DAYS Performed at [facility], [address] [place], [place] [address]    Report Status PENDING  Incomplete  Surgical pcr screen     Status: None   Collection Time: 06/21/23  5:53 AM   Specimen: Nasal Mucosa; Nasal Swab  Result Value Ref Range Status   MRSA, PCR NEGATIVE NEGATIVE Final   Staphylococcus aureus NEGATIVE NEGATIVE Final    Comment: (NOTE) The Xpert SA Assay (FDA approved for NASAL specimens in patients 47 years of age and older), is one component of a comprehensive surveillance program. It is not intended to diagnose infection nor to guide or monitor treatment. Performed at [facility], [address] [place], [place] [address]     Radiology Studies: No results found.     Lashala Laser T. Nikkolas Coomes [facility]  If 7PM-7AM, please contact night-coverage [email or web address] 06/23/2023, 2:17 PM

## 2023-06-23 NOTE — Progress Notes (Signed)
 [facility]  Date of Admission:  06/19/2023     Reason for Follow Up: Hardware complicating wound infection (HCC)  Total days of antibiotics 5         ASSESSMENT:  [name] is postop day 2 from removal of right foot hardware in the setting of exposed hardware through the skin.  Blood cultures remain without growth to date.  Previously grew Enterobacter cloacae and Staphylococcus epidermidis.  Discussed plan of care to continue current dose of ertapenem  and daptomycin  for a total of 6 weeks.  Will need PICC line prior to discharge.  Home health/OPAT orders placed.  Continue postoperative wound care per orthopedics.  Discussed importance of continued nutritional intake to allow for healing.  Will arrange follow-up in the ID clinic.  Remaining medical and supportive care per internal medicine.  PLAN:  Continue current dose of ertapenem  and daptomycin . Therapeutic drug monitoring of CK levels Will need PICC line placement prior to discharge. OPAT/home health orders Postoperative wound care per orthopedics. Optimize nutrition for healing. Standard/universal precautions. Follow-up in ID office. Remaining medical and supportive care per internal medicine. Diagnosis: Hardware complicating wound infection  Culture Result: Previously Staphylococcus epidermidis and Enterobacter cloacae  Allergies  Allergen Reactions   Beta-Lactamase Inhibitors (Beta-Lactam) Hives, Itching and Rash    AX - penicillin     Ancef  given 9/24 with no obvious reaction   Metformin  And Related Diarrhea and Nausea Only   Other Nausea And Vomiting    General Anesthesia - sometimes causes nausea and vomiting * OK to use scopolamine  patch     Sulfa Antibiotics Hives and Other (See Comments)    Bactrim   Wellbutrin [Bupropion] Other (See Comments)    Mood Changes   Tetracyclines & Related Hives and Rash    doxycycline     OPAT Orders Discharge antibiotics to be given via  PICC line Discharge antibiotics: Daptomycin  800 mg IV every 24 hours and ertapenem  1 g every 24 hours Per pharmacy protocol   Duration: 6 weeks End Date: 08/02/2023  [facility] Care Per Protocol:  Home health RN for IV administration and teaching; PICC line care and labs.    Labs weekly while on IV antibiotics: _X_ CBC with differential __ BMP _X_ CMP _X_ CRP _X_ ESR __ Vancomycin  trough _X_ CK  __ Please pull PIC at completion of IV antibiotics _X_ Please leave PIC in place until doctor has seen patient or been notified  Fax weekly labs to [phone]  Clinic Follow Up Appt:  07/17/2023 with Dr. Gillian [name]   Principal Problem:   Hardware complicating wound infection [facility]) Active Problems:   Essential hypertension   CAD, LAD/CFX DES 04/28/12- staged RCA DES 04/29/12 after pt declined CABG, cath 05/14/13 stable CAD medical therapy   OSA on CPAP   Pulmonary hypertension,   Chronic renal disease, stage IV (HCC)   Obesity, Class III, BMI 40-49.9 (morbid obesity)   COPD (chronic obstructive pulmonary disease) (HCC)   Restrictive lung disease   Chronic heart failure with preserved ejection fraction (HFpEF) (HCC)   Gastroesophageal reflux disease without esophagitis   Paroxysmal atrial fibrillation (HCC)   Uncontrolled type 2 diabetes mellitus with hyperglycemia, with long-term current use of insulin  (HCC)   Acquired hypothyroidism   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, right (HCC)   PAD (peripheral artery disease) (HCC)   Hyponatremia    allopurinol   50 mg Oral Daily   amiodarone   200 mg Oral BID  apixaban   5 mg Oral BID   atorvastatin   40 mg Oral QHS   cholecalciferol   2,000 Units Oral Daily   clopidogrel   75 mg Oral Daily   docusate sodium   100 mg Oral BID   DULoxetine   60 mg Oral BID   ezetimibe   10 mg Oral Daily   ferrous sulfate   325 mg Oral Once per day on Monday Wednesday Friday   gabapentin   300 mg Oral QHS   Gerhardt's butt cream   Topical  BID   insulin  aspart  0-15 Units Subcutaneous TID WC   insulin  aspart  0-5 Units Subcutaneous QHS   insulin  glargine-yfgn  15 Units Subcutaneous QHS   leptospermum manuka honey  1 Application Topical Daily   levothyroxine   88 mcg Oral QAC breakfast   loratadine   10 mg Oral Daily   magnesium  oxide  400 mg Oral Daily   multivitamin with minerals  1 tablet Oral Daily   pantoprazole   40 mg Oral Daily   tamsulosin   0.4 mg Oral Daily    SUBJECTIVE:  Afebrile overnight with no acute events.  Tolerating antibiotics with no adverse side effects.  Denies fevers, chills, or sweats.  Allergies  Allergen Reactions   Beta-Lactamase Inhibitors (Beta-Lactam) Hives, Itching and Rash    AX - penicillin     Ancef  given 9/24 with no obvious reaction   Metformin  And Related Diarrhea and Nausea Only   Other Nausea And Vomiting    General Anesthesia - sometimes causes nausea and vomiting * OK to use scopolamine  patch     Sulfa Antibiotics Hives and Other (See Comments)    Bactrim   Wellbutrin [Bupropion] Other (See Comments)    Mood Changes   Tetracyclines & Related Hives and Rash    doxycycline      Review of Systems: Review of Systems  Constitutional:  Negative for chills, fever and weight loss.  Respiratory:  Negative for cough, shortness of breath and wheezing.   Cardiovascular:  Negative for chest pain and leg swelling.  Gastrointestinal:  Negative for abdominal pain, constipation, diarrhea, nausea and vomiting.  Skin:  Negative for rash.      OBJECTIVE: Vitals:   06/22/23 1652 06/22/23 1945 06/23/23 0504 06/23/23 0757  BP: (!) 160/57 (!) 153/76 (!) 176/69 (!) 151/58  Pulse: (!) 57 (!) 57 63 67  Resp: 17 18 16    Temp: 98 F (36.7 C) (!) 97.4 F (36.3 C) 98.3 F (36.8 C) 97.7 F (36.5 C)  TempSrc: Oral Oral Oral Oral  SpO2: 100% 99% 100% 98%  Weight:      Height:       Body mass index is 36.22 kg/m.  Physical Exam Constitutional:      General: He is not in acute  distress.    Appearance: He is well-developed.  Cardiovascular:     Rate and Rhythm: Normal rate and regular rhythm.     Heart sounds: Normal heart sounds.  Pulmonary:     Effort: Pulmonary effort is normal.     Breath sounds: Normal breath sounds.  Skin:    General: Skin is warm and dry.  Neurological:     Mental Status: He is alert and oriented to person, place, and time.  Psychiatric:        Mood and Affect: Mood normal.     Lab Results Lab Results  Component Value Date   WBC 7.2 06/23/2023   HGB 9.0 (L) 06/23/2023   HCT 27.8 (L) 06/23/2023   MCV 90.3  06/23/2023   PLT 126 (L) 06/23/2023    Lab Results  Component Value Date   CREATININE 1.90 (H) 06/23/2023   BUN 14 06/23/2023   NA 138 06/23/2023   K 4.0 06/23/2023   CL 106 06/23/2023   CO2 26 06/23/2023    Lab Results  Component Value Date   ALT 12 06/20/2023   AST 18 06/20/2023   GGT 28 02/20/2018   ALKPHOS 94 06/20/2023   BILITOT 0.6 06/20/2023     Microbiology: Recent Results (from the past 240 hours)  Culture, blood (routine x 2)     Status: None (Preliminary result)   Collection Time: 06/19/23  8:23 PM   Specimen: BLOOD  Result Value Ref Range Status   Specimen Description BLOOD LEFT ANTECUBITAL  Final   Special Requests   Final    BOTTLES DRAWN AEROBIC AND ANAEROBIC Blood Culture results may not be optimal due to an inadequate volume of blood received in culture bottles   Culture   Final    NO GROWTH 4 DAYS Performed at [facility], [address], [place], [place] [address]    Report Status PENDING  Incomplete  Culture, blood (routine x 2)     Status: None (Preliminary result)   Collection Time: 06/20/23  1:14 AM   Specimen: BLOOD LEFT HAND  Result Value Ref Range Status   Specimen Description BLOOD LEFT HAND  Final   Special Requests   Final    BOTTLES DRAWN AEROBIC AND ANAEROBIC Blood Culture adequate volume   Culture   Final    NO GROWTH 3 DAYS Performed at [facility],  [address] [place], [place] [address]    Report Status PENDING  Incomplete  Surgical pcr screen     Status: None   Collection Time: 06/21/23  5:53 AM   Specimen: Nasal Mucosa; Nasal Swab  Result Value Ref Range Status   MRSA, PCR NEGATIVE NEGATIVE Final   Staphylococcus aureus NEGATIVE NEGATIVE Final    Comment: (NOTE) The Xpert SA Assay (FDA approved for NASAL specimens in patients 30 years of age and older), is one component of a comprehensive surveillance program. It is not intended to diagnose infection nor to guide or monitor treatment. Performed at [facility] Lab, [address], [place], [place] [address]      Marlan Silva, NP [facility]  06/23/2023  1:09 PM

## 2023-06-24 ENCOUNTER — Other Ambulatory Visit: Payer: Self-pay

## 2023-06-24 DIAGNOSIS — I272 Pulmonary hypertension, unspecified: Secondary | ICD-10-CM | POA: Diagnosis not present

## 2023-06-24 DIAGNOSIS — L97314 Non-pressure chronic ulcer of right ankle with necrosis of bone: Secondary | ICD-10-CM | POA: Diagnosis not present

## 2023-06-24 DIAGNOSIS — J449 Chronic obstructive pulmonary disease, unspecified: Secondary | ICD-10-CM | POA: Diagnosis not present

## 2023-06-24 DIAGNOSIS — I739 Peripheral vascular disease, unspecified: Secondary | ICD-10-CM | POA: Diagnosis not present

## 2023-06-24 LAB — GLUCOSE, CAPILLARY
Glucose-Capillary: 107 mg/dL — ABNORMAL HIGH (ref 70–99)
Glucose-Capillary: 110 mg/dL — ABNORMAL HIGH (ref 70–99)
Glucose-Capillary: 133 mg/dL — ABNORMAL HIGH (ref 70–99)
Glucose-Capillary: 123 mg/dL — ABNORMAL HIGH (ref 70–99)

## 2023-06-24 LAB — CULTURE, BLOOD (ROUTINE X 2): Culture: NO GROWTH

## 2023-06-24 MED ORDER — JUVEN PO PACK
1.0000 | PACK | Freq: Two times a day (BID) | ORAL | Status: DC
Start: 2023-06-24 — End: 2023-06-28
  Administered 2023-06-25 (×2): 1 via ORAL
  Filled 2023-06-24 (×5): qty 1

## 2023-06-24 NOTE — Progress Notes (Signed)
 Occupational Therapy Treatment Patient Details Name: Harold [name] MRN: [id number] DOB: [birth year] Today's Date: 06/24/2023   History of present illness 68 y.o. male admitted 06/19/23 with R heel wound and exposed hardware, also with hypotension and seizure-like activity at home per [facility]. S/p hardware removal R foot on 5/3. Of note, h/o R ankle fx s/p R ankle/subtalar arthrodesis 05/15/23. Other PMH includes HTN, HLD, CAD, COPD, DM2, CKD, PVD, gout, afib, obesity, neuropathy.   OT comments  Pt progressing well towards goals. Able to progress to complete functional transfer with mod +2 assist for safety. Pt c/o of dizziness with mobility, orthostatic vitals were attempted, see PT note for further details. Per pt, this is typical transfer for him to [facility]. OT is recommending <3 hours of skilled rehab daily to optimize independence levels. Will continue to follow acutely.       If plan is discharge home, recommend the following:  Two people to help with walking and/or transfers;A lot of help with bathing/dressing/bathroom;Assistance with cooking/housework;Assist for transportation;Help with stairs or ramp for entrance   Equipment Recommendations  Other (comment) (Defer to next venue)    Recommendations for Other Services      Precautions / Restrictions Precautions Precautions: Fall Recall of Precautions/Restrictions: Intact Restrictions Weight Bearing Restrictions Per Provider Order: Yes RLE Weight Bearing Per Provider Order: Weight bearing as tolerated       Mobility Bed Mobility Overal bed mobility: Needs Assistance Bed Mobility: Supine to Sit     Supine to sit: Mod assist, HOB elevated, Used rails     General bed mobility comments: Mod assist to scoot EOB    Transfers Overall transfer level: Needs assistance Equipment used: Rolling walker (2 wheels) Transfers: Bed to chair/wheelchair/BSC, Sit to/from Stand Sit to Stand: Mod assist, +2 physical assistance, +2  safety/equipment Stand pivot transfers: Mod assist, +2 physical assistance, +2 safety/equipment         General transfer comment: Pt able to come to standing and complete transfer, pt unsteady and c/o of orthostatic symptoms     Balance Overall balance assessment: Needs assistance Sitting-balance support: Feet supported, Bilateral upper extremity supported, Single extremity supported Sitting balance-Leahy Scale: Poor Sitting balance - Comments: Reliant ton UE support, min to CGA   Standing balance support: Bilateral upper extremity supported, During functional activity, Reliant on assistive device for balance Standing balance-Leahy Scale: Poor Standing balance comment: Reliant on RW         ADL either performed or assessed with clinical judgement   ADL Overall ADL's : Needs assistance/impaired       Upper Body Dressing : Set up;Sitting       Toilet Transfer: Moderate assistance;+2 for physical assistance;+2 for safety/equipment;Stand-pivot;Rolling walker (2 wheels) Toilet Transfer Details (indicate cue type and reason): Simulated in room         Functional mobility during ADLs: Moderate assistance;+2 for physical assistance;+2 for safety/equipment;Rolling walker (2 wheels)      Extremity/Trunk Assessment Upper Extremity Assessment Upper Extremity Assessment: Overall WFL for tasks assessed   Lower Extremity Assessment Lower Extremity Assessment: Defer to PT evaluation        Vision   Vision Assessment?: No apparent visual deficits         Communication Communication Communication: No apparent difficulties   Cognition Arousal: Alert Behavior During Therapy: WFL for tasks assessed/performed Cognition: No apparent impairments   Following commands: Intact        Cueing   Cueing Techniques: Verbal cues  General Comments Pt c/o of dizziness w/ mobility, orthostatic vitals attempted to be recorded, dynamap unable to register accurate reading in  standing, see PT note    Pertinent Vitals/ Pain       Pain Assessment Pain Assessment: Faces Faces Pain Scale: Hurts a little bit Pain Location: R ankle Pain Descriptors / Indicators: Sore Pain Intervention(s): Monitored during session   Frequency  Min 2X/week        Progress Toward Goals  OT Goals(current goals can now be found in the care plan section)  Progress towards OT goals: Progressing toward goals  Acute Rehab OT Goals Patient Stated Goal: To get stronger OT Goal Formulation: With patient Time For Goal Achievement: 07/05/23 Potential to Achieve Goals: Good ADL Goals Pt Will Perform Upper Body Dressing: with set-up;sitting Pt Will Transfer to Toilet: with min assist;stand pivot transfer;bedside commode Additional ADL Goal #1: Pt will be able to perform supine to sit and maintain sitting balance at EOB with min A to maximize independence and reduce caregiver burden.  Plan      Co-evaluation    PT/OT/SLP Co-Evaluation/Treatment: Yes Reason for Co-Treatment: For patient/therapist safety;To address functional/ADL transfers   OT goals addressed during session: ADL's and self-care;Strengthening/ROM      AM-PAC OT "6 Clicks" Daily Activity     Outcome Measure   Help from another person eating meals?: None Help from another person taking care of personal grooming?: A Little Help from another person toileting, which includes using toliet, bedpan, or urinal?: Total Help from another person bathing (including washing, rinsing, drying)?: A Lot Help from another person to put on and taking off regular upper body clothing?: A Little Help from another person to put on and taking off regular lower body clothing?: Total 6 Click Score: 14    End of Session Equipment Utilized During Treatment: Gait belt;Rolling walker (2 wheels)  OT Visit Diagnosis: Unsteadiness on feet (R26.81);Other abnormalities of gait and mobility (R26.89);Muscle weakness (generalized)  (M62.81);Repeated falls (R29.6);History of falling (Z91.81);Pain Pain - Right/Left: Right Pain - part of body: Ankle and joints of foot   Activity Tolerance Patient tolerated treatment well   Patient Left in chair;with call bell/phone within reach;with chair alarm set   Nurse Communication Mobility status        Time: 1478-2956 OT Time Calculation (min): 24 min  Charges: OT General Charges $OT Visit: 1 Visit OT Treatments $Self Care/Home Management : 8-22 mins  Delmer Ferraris, OT  Acute Rehabilitation Services Office [phone] Secure chat preferred   Mickael Alamo 06/24/2023, 10:54 AM

## 2023-06-24 NOTE — Progress Notes (Signed)
 PROGRESS NOTE  Harold [name]:[id number] DOB: Jun 25, 1955   PCP: Amin, Saad, MD  Patient is from: Home.  Lives with significant other.  DOA: 06/19/2023 LOS: 5  Chief complaints Chief Complaint  Patient presents with   Post-op Problem     Brief Narrative / Interim history: 68 year old M with PMH of CAD/PCI, CKD-4, COPD, PAH, OSA, DM with neuropathy, chronic venous insufficiency, orthostatic hypotension, HLD and diabetic foot ulcer, traumatic right ankle fracture in 12/2022 requiring hardware placement complicated by need for multiple surgeries and infection with Enterobacter cloacae bacteremia and recent right ankle arthrodesis on 3/27 presenting with pain, bleeding and exposed hardware.  No report of fever or constitutional symptoms.  He was on ciprofloxacin  outpatient.  In ED, stable vitals.  No leukocytosis.  CRP 5.0. Cr 2.18 (above baseline).  Hgb 8.8 (above baseline).  Right ankle x-ray showed postoperative changes with surgical hardware unchanged from prior, mildly displaced fracture deformity of the posterior tibial malleolus, degenerative changes in the ankle joint and first MPJ and soft tissue swelling over the dorsum of the foot.  Blood cultures obtained.  Started on broad-spectrum antibiotics.  Orthopedic surgery consulted   The next day, blood cultures NGTD.  Hgb dropped to 7.3.  Transfused 1 unit with appropriate response.  Underwent hardware removal of right foot on 5/3.    ID recommended PICC line and 6 weeks of ertapenem  and daptomycin .  Therapy recommended SNF.  Subjective: Seen and examined earlier this morning.  No major events overnight of this morning.  No complaints. Objective: Vitals:   06/23/23 1942 06/24/23 0439 06/24/23 0757 06/24/23 1316  BP: (!) 153/62 103/74 (!) 142/57 (!) 173/68  Pulse: 66 63 66 67  Resp: 18 16 18 18   Temp: 98 F (36.7 C) 98.3 F (36.8 C) (!) 97.5 F (36.4 C) 99 F (37.2 C)  TempSrc: Oral Oral  Oral  SpO2: 96% 98% 96% 99%   Weight:      Height:        Examination:  GENERAL: No apparent distress.  Nontoxic. HEENT: MMM.  Vision and hearing grossly intact.  NECK: Supple.  No apparent JVD.  RESP:  No IWOB.  Fair aeration bilaterally. CVS:  RRR. Heart sounds normal.  ABD/GI/GU: BS+. Abd soft, NTND.  MSK/EXT:  Moves extremities.  Ace wrap over right foot. SKIN: Ace wrap over right foot. NEURO: Awake, alert and oriented appropriately.  No apparent focal neuro deficit. PSYCH: Calm. Normal affect.   Consultants:  Orthopedic surgery  Procedures: 5/3-right foot/ankle hardware removal by Dr. Curtiss Dowdy  Microbiology summarized: Blood cultures NGTD  Assessment and plan: Right ankle hardware infection: Patient with traumatic right ankle fracture in 12/2022 requiring hardware placement complicated by need for multiple surgeries and infection with Enterobacter cloacae bacteremia and recent right ankle arthrodesis on 3/27.  Seems  to be on Cipro  outpatient. -S/p right ankle hardware removal by Dr. Curtiss Dowdy on 5/3 -ID recommended PICC line and 6 weeks of antibiotics with ertapenem  and daptomycin . -Pain control per Ortho -On home Plavix  and Eliquis  which would serve VTE prophylaxis -WBAT on RLE per Ortho -PT/OT-recommended SNF.  IDDM-2 with hyperglycemia, neuropathy and diabetic ulcer: A1c 7.0% in 12/2022 Recent Labs  Lab 06/23/23 1152 06/23/23 1620 06/23/23 2152 06/24/23 0636 06/24/23 1142  GLUCAP 107* 116* 166* 107* 110*  -Continue Semglee  15 units at night -Continue SSI-moderate -Continue home Cymbalta , gabapentin  and Lipitor -Repeat A1c not reliable after transfusion.   CKD-4: Renal function improved. Recent Labs    05/18/23 0809 05/19/23  1610 05/20/23 9604 05/21/23 0504 05/26/23 1420 06/19/23 2023 06/20/23 0114 06/20/23 0115 06/21/23 5409 06/22/23 0546 06/23/23 0959  BUN 30* 27* 26* 26* 33* 17  --  18 15 16 14   CREATININE 2.83* 2.76* 2.62* 2.38* 2.47* 2.18* 2.12* 2.14* 2.05* 2.14* 1.90*   - Continue monitoring  Paroxysmal A-fib: Rate controlled -Continue amiodarone  and Eliquis    pulmonary hypertension/OSA - CPAP at night - Minimize sedating medications   Orthostatic hypotension: Chronic.  Resting BP elevated. - Discontinue midodrine  - Elevate head of bed to > 30 degrees - OOB/PT/OT/fall precaution  Anemia of renal disease: Hgb dropped to 7.3 on 5/2.  Transfused 1 units with appropriate response.  Baseline Hgb about 8.0.  No nutritional deficiency on anemia panel Recent Labs    05/18/23 0809 05/19/23 0759 05/20/23 0614 05/26/23 1420 05/26/23 1427 06/19/23 2023 06/20/23 0114 06/21/23 8119 06/22/23 0546 06/23/23 0654  HGB 7.7* 7.5* 8.6* 10.0* 9.5* 8.8* 7.3* 8.7* 8.2* 9.0*  -Continue monitoring   PAD: On Plavix  and Lipitor - Continue home Lipitor and Plavix    Hypothyroidism:  -Continue levothyroxine   BPH - Continue home Flomax   GERD - Continue PPI  History of gout -Continue allopurinol   Class II obesity Body mass index is 36.22 kg/m.  Pressure skin injury: Present on arrival Pressure Injury 06/20/23 Sacrum Right Unstageable - Full thickness tissue loss in which the base of the injury is covered by slough (yellow, tan, gray, green or brown) and/or eschar (tan, brown or black) in the wound bed. (Active)  06/20/23 0050  Location: Sacrum  Location Orientation: Right  Staging: Unstageable - Full thickness tissue loss in which the base of the injury is covered by slough (yellow, tan, gray, green or brown) and/or eschar (tan, brown or black) in the wound bed.  Wound Description (Comments):   Present on Admission: Yes  Dressing Type Foam - Lift dressing to assess site every shift 06/20/23 0358   DVT prophylaxis:  SCDs Start: 06/21/23 0914 apixaban  (ELIQUIS ) tablet 5 mg  Code Status: Full code Family Communication: None at bedside today.   Level of care: Med-Surg Status is: Inpatient Remains inpatient appropriate because: Right ankle hardware  infection   Final disposition: SNF   55 minutes with more than 50% spent in reviewing records, counseling patient/family and coordinating care.   Sch Meds:  Scheduled Meds:  allopurinol   50 mg Oral Daily   amiodarone   200 mg Oral BID   apixaban   5 mg Oral BID   atorvastatin   40 mg Oral QHS   cholecalciferol   2,000 Units Oral Daily   clopidogrel   75 mg Oral Daily   docusate sodium   100 mg Oral BID   DULoxetine   60 mg Oral BID   ezetimibe   10 mg Oral Daily   ferrous sulfate   325 mg Oral Once per day on Monday Wednesday Friday   gabapentin   300 mg Oral QHS   Gerhardt's butt cream   Topical BID   insulin  aspart  0-15 Units Subcutaneous TID WC   insulin  aspart  0-5 Units Subcutaneous QHS   insulin  glargine-yfgn  15 Units Subcutaneous QHS   leptospermum manuka honey  1 Application Topical Daily   levothyroxine   88 mcg Oral QAC breakfast   loratadine   10 mg Oral Daily   magnesium  oxide  400 mg Oral Daily   multivitamin with minerals  1 tablet Oral Daily   nutrition supplement (JUVEN)  1 packet Oral BID BM   pantoprazole   40 mg Oral Daily   tamsulosin   0.4 mg Oral Daily   Continuous Infusions:  DAPTOmycin  Stopped (06/23/23 1345)   ertapenem  1 g (06/24/23 1018)   PRN Meds:.acetaminophen  **OR** acetaminophen , HYDROmorphone  (DILAUDID ) injection, methocarbamol , metoCLOPramide  **OR** metoCLOPramide  (REGLAN ) injection, nitroGLYCERIN , oxyCODONE , polyethylene glycol  Antimicrobials: Anti-infectives (From admission, onward)    Start     Dose/Rate Route Frequency Ordered Stop   06/23/23 1030  ertapenem  (INVANZ ) 1 g in sodium chloride  0.9 % 100 mL IVPB        1 g 200 mL/hr over 30 Minutes Intravenous Every 24 hours 06/23/23 0928     06/21/23 2200  DAPTOmycin  (CUBICIN ) 800 mg in sodium chloride  0.9 % IVPB        8 mg/kg  97.7 kg (Adjusted) 132 mL/hr over 30 Minutes Intravenous Daily 06/20/23 1005     06/21/23 0716  ceFAZolin  (ANCEF ) 2-4 GM/100ML-% IVPB       Note to Pharmacy: Darrell Else D: cabinet override      06/21/23 0716 06/21/23 0757   06/21/23 0715  ceFAZolin  (ANCEF ) IVPB 2g/100 mL premix        2 g 200 mL/hr over 30 Minutes Intravenous  Once 06/21/23 0706 06/21/23 0753   06/20/23 0045  ciprofloxacin  (CIPRO ) tablet 500 mg  Status:  Discontinued        500 mg Oral 2 times daily 06/19/23 2358 06/20/23 1001   06/19/23 2316  vancomycin  (VANCOREADY) IVPB 2000 mg/400 mL  Status:  Discontinued        2,000 mg 200 mL/hr over 120 Minutes Intravenous Every 48 hours 06/19/23 2316 06/20/23 1005   06/19/23 2315  ceFEPIme  (MAXIPIME ) 2 g in sodium chloride  0.9 % 100 mL IVPB  Status:  Discontinued        2 g 200 mL/hr over 30 Minutes Intravenous Every 12 hours 06/19/23 2311 06/23/23 0928   06/19/23 2245  cefTRIAXone  (ROCEPHIN ) 2 g in sodium chloride  0.9 % 100 mL IVPB  Status:  Discontinued       Placed in "And" Linked Group   2 g 200 mL/hr over 30 Minutes Intravenous Once 06/19/23 2241 06/19/23 2311   06/19/23 2245  metroNIDAZOLE  (FLAGYL ) IVPB 500 mg       Placed in "And" Linked Group   500 mg 100 mL/hr over 60 Minutes Intravenous  Once 06/19/23 2241 06/20/23 0014   06/19/23 2245  vancomycin  (VANCOCIN ) IVPB 1000 mg/200 mL premix  Status:  Discontinued       Placed in "And" Linked Group   1,000 mg 200 mL/hr over 60 Minutes Intravenous  Once 06/19/23 2241 06/19/23 2243   06/19/23 2245  vancomycin  (VANCOREADY) IVPB 2000 mg/400 mL  Status:  Discontinued        2,000 mg 200 mL/hr over 120 Minutes Intravenous  Once 06/19/23 2244 06/19/23 2316        I have personally reviewed the following labs and images: CBC: Recent Labs  Lab 06/19/23 2023 06/20/23 0114 06/21/23 0633 06/22/23 0546 06/23/23 0654  WBC 4.9 4.2 4.8 6.7 7.2  NEUTROABS 2.9  --   --   --   --   HGB 8.8* 7.3* 8.7* 8.2* 9.0*  HCT 27.0* 22.1* 26.7* 25.3* 27.8*  MCV 89.1 88.0 88.4 89.4 90.3  PLT 158 128* 135* 132* 126*   BMP &GFR Recent Labs  Lab 06/19/23 2023 06/20/23 0114 06/20/23 0115  06/21/23 0633 06/22/23 0546 06/23/23 0959  NA 132*  --  136 137 135 138  K 4.4  --  4.1 3.8 3.8 4.0  CL  101  --  104 108 106 106  CO2 20*  --  22 22 22 26   GLUCOSE 159*  --  149* 133* 204* 122*  BUN 17  --  18 15 16 14   CREATININE 2.18* 2.12* 2.14* 2.05* 2.14* 1.90*  CALCIUM  8.3*  --  8.1* 8.2* 8.2* 8.6*  MG  --   --   --  1.7 1.8  --   PHOS  --   --   --  3.1  --  2.8   Estimated Creatinine Clearance: 50.7 mL/min (A) (by C-G formula based on SCr of 1.9 mg/dL (H)). Liver & Pancreas: Recent Labs  Lab 06/19/23 2023 06/20/23 0115 06/21/23 0633 06/23/23 0959  AST 34 18  --   --   ALT 9 12  --   --   ALKPHOS 111 94  --   --   BILITOT 1.3* 0.6  --   --   PROT 5.4* 4.7*  --   --   ALBUMIN  2.3* 2.0* 2.0* 1.8*   No results for input(s): "LIPASE", "AMYLASE" in the last 168 hours. No results for input(s): "AMMONIA" in the last 168 hours. Diabetic: No results for input(s): "HGBA1C" in the last 72 hours. Recent Labs  Lab 06/23/23 1152 06/23/23 1620 06/23/23 2152 06/24/23 0636 06/24/23 1142  GLUCAP 107* 116* 166* 107* 110*   Cardiac Enzymes: Recent Labs  Lab 06/21/23 0633  CKTOTAL 36*   No results for input(s): "PROBNP" in the last 8760 hours. Coagulation Profile: No results for input(s): "INR", "PROTIME" in the last 168 hours. Thyroid  Function Tests: No results for input(s): "TSH", "T4TOTAL", "FREET4", "T3FREE", "THYROIDAB" in the last 72 hours. Lipid Profile: No results for input(s): "CHOL", "HDL", "LDLCALC", "TRIG", "CHOLHDL", "LDLDIRECT" in the last 72 hours. Anemia Panel: No results for input(s): "VITAMINB12", "FOLATE", "FERRITIN", "TIBC", "IRON", "RETICCTPCT" in the last 72 hours.  Urine analysis:    Component Value Date/Time   COLORURINE YELLOW 04/15/2023 1732   APPEARANCEUR CLOUDY (A) 04/15/2023 1732   LABSPEC 1.014 04/15/2023 1732   PHURINE 5.0 04/15/2023 1732   GLUCOSEU >=500 (A) 04/15/2023 1732   HGBUR MODERATE (A) 04/15/2023 1732   BILIRUBINUR  NEGATIVE 04/15/2023 1732   BILIRUBINUR negative 05/12/2018 1213   BILIRUBINUR small 06/29/2014 0859   KETONESUR NEGATIVE 04/15/2023 1732   PROTEINUR >=300 (A) 04/15/2023 1732   UROBILINOGEN 0.2 05/12/2018 1213   UROBILINOGEN 1.0 05/02/2014 2015   NITRITE NEGATIVE 04/15/2023 1732   LEUKOCYTESUR NEGATIVE 04/15/2023 1732   Sepsis Labs: Invalid input(s): "PROCALCITONIN", "LACTICIDVEN"  Microbiology: Recent Results (from the past 240 hours)  Culture, blood (routine x 2)     Status: None   Collection Time: 06/19/23  8:23 PM   Specimen: BLOOD  Result Value Ref Range Status   Specimen Description BLOOD LEFT ANTECUBITAL  Final   Special Requests   Final    BOTTLES DRAWN AEROBIC AND ANAEROBIC Blood Culture results may not be optimal due to an inadequate volume of blood received in culture bottles   Culture   Final    NO GROWTH 5 DAYS Performed at [facility], [address] [place], [place] [address]    Report Status 06/24/2023 FINAL  Final  Culture, blood (routine x 2)     Status: None (Preliminary result)   Collection Time: 06/20/23  1:14 AM   Specimen: BLOOD LEFT HAND  Result Value Ref Range Status   Specimen Description BLOOD LEFT HAND  Final   Special Requests   Final  BOTTLES DRAWN AEROBIC AND ANAEROBIC Blood Culture adequate volume   Culture   Final    NO GROWTH 4 DAYS Performed at [facility], [address] [place], [place] [address]    Report Status PENDING  Incomplete  Surgical pcr screen     Status: None   Collection Time: 06/21/23  5:53 AM   Specimen: Nasal Mucosa; Nasal Swab  Result Value Ref Range Status   MRSA, PCR NEGATIVE NEGATIVE Final   Staphylococcus aureus NEGATIVE NEGATIVE Final    Comment: (NOTE) The Xpert SA Assay (FDA approved for NASAL specimens in patients 47 years of age and older), is one component of a comprehensive surveillance program. It is not intended to diagnose infection nor to guide or monitor treatment. Performed at  [facility], [address], [place], [place] [address]     Radiology Studies: US  EKG SITE RITE Result Date: 06/24/2023 If Site Rite image not attached, placement could not be confirmed due to current cardiac rhythm.      Dezaray Shibuya T. Onur Mori [facility]  If 7PM-7AM, please contact night-coverage [email or web address] 06/24/2023, 2:56 PM

## 2023-06-24 NOTE — Progress Notes (Signed)
 Physical Therapy Treatment Patient Details Name: Harold [name] MRN: [id number] DOB: 1955-05-31 Today's Date: 06/24/2023   History of Present Illness 68 y.o. male admitted 06/19/23 with R heel wound and exposed hardware, also with hypotension and seizure-like activity at home per [facility]. S/p hardware removal R foot on 5/3. Of note, h/o R ankle fx s/p R ankle/subtalar arthrodesis 05/15/23. Other PMH includes HTN, HLD, CAD, COPD, DM2, CKD, PVD, gout, afib, obesity, neuropathy.    PT Comments  Continuing work on functional mobility and activity tolerance;  session focused on safe functional transfers, with close monitoring of syncopal symptoms; pt was symptomatic for lightheadedness/dizziness with incr time in standing (unable to get a reliable BP read in standing); Details as follows:     06/24/23 1100 06/24/23 1105  Vital Signs  Patient Position (if appropriate) Orthostatic Vitals  --   Orthostatic Lying   BP- Lying 127/54  --   Pulse- Lying 67  --   Orthostatic Sitting  BP- Sitting 110/50 111/63 (immediately upon sitting after step pivot transfer and attempt to get standing BP)  Pulse- Sitting 70 70  Orthostatic Standing at 0 minutes  BP- Standing at 0 minutes  (Unable to maintain standing long enough to get standing BP read; symptomatic for lightheadedness)  --      Patient will benefit from continued inpatient follow up therapy, <3 hours/day    If plan is discharge home, recommend the following: Two people to help with walking and/or transfers;A lot of help with bathing/dressing/bathroom;Assistance with cooking/housework;Assist for transportation;Help with stairs or ramp for entrance   Can travel by private vehicle     No  Equipment Recommendations   (TBD - potential hospital bed, smaller w/c size)    Recommendations for Other Services       Precautions / Restrictions Precautions Precautions: Fall Recall of Precautions/Restrictions: Intact Restrictions Weight Bearing  Restrictions Per Provider Order: Yes RLE Weight Bearing Per Provider Order: Weight bearing as tolerated     Mobility  Bed Mobility Overal bed mobility: Needs Assistance Bed Mobility: Supine to Sit     Supine to sit: Mod assist, HOB elevated, Used rails     General bed mobility comments: Mod assist to scoot EOB    Transfers Overall transfer level: Needs assistance Equipment used: Rolling walker (2 wheels) Transfers: Bed to chair/wheelchair/BSC, Sit to/from Stand Sit to Stand: Mod assist, +2 physical assistance, +2 safety/equipment   Step pivot transfers: Mod assist, +2 safety/equipment       General transfer comment: Pt able to come to standing and complete transfer, pt unsteady and c/o of orthostatic symptoms; dependent on RW for steadiness    Ambulation/Gait                   Stairs             Wheelchair Mobility     Tilt Bed    Modified Rankin (Stroke Patients Only)       Balance Overall balance assessment: Needs assistance Sitting-balance support: Feet supported, Bilateral upper extremity supported, Single extremity supported Sitting balance-Leahy Scale: Poor Sitting balance - Comments: Reliant ton UE support, min to CGA   Standing balance support: Bilateral upper extremity supported, During functional activity, Reliant on assistive device for balance Standing balance-Leahy Scale: Poor Standing balance comment: Reliant on RW                            Communication Communication Communication: No apparent  difficulties  Cognition Arousal: Alert Behavior During Therapy: WFL for tasks assessed/performed                             Following commands: Intact      Cueing Cueing Techniques: Verbal cues  Exercises      General Comments General comments (skin integrity, edema, etc.): Pt c/o of dizziness w/ mobility, orthostatic vitals attempted to be recorded, dynamap unable to register accurate reading in  standing      Pertinent Vitals/Pain Pain Assessment Pain Assessment: Faces Faces Pain Scale: Hurts a little bit Pain Location: R ankle Pain Descriptors / Indicators: Sore Pain Intervention(s): Monitored during session    Home Living                          Prior Function            PT Goals (current goals can now be found in the care plan section) Acute Rehab PT Goals Patient Stated Goal: return home, decreased pain PT Goal Formulation: With patient Time For Goal Achievement: 07/06/23 Potential to Achieve Goals: Good Progress towards PT goals: Progressing toward goals    Frequency    Min 2X/week      PT Plan      Co-evaluation   Reason for Co-Treatment: For patient/therapist safety;To address functional/ADL transfers   OT goals addressed during session: ADL's and self-care;Strengthening/ROM      AM-PAC PT "6 Clicks" Mobility   Outcome Measure  Help needed turning from your back to your side while in a flat bed without using bedrails?: A Little Help needed moving from lying on your back to sitting on the side of a flat bed without using bedrails?: A Lot Help needed moving to and from a bed to a chair (including a wheelchair)?: A Lot Help needed standing up from a chair using your arms (e.g., wheelchair or bedside chair)?: Total Help needed to walk in hospital room?: Total Help needed climbing 3-5 steps with a railing? : Total 6 Click Score: 10    End of Session Equipment Utilized During Treatment: Gait belt Activity Tolerance: Patient tolerated treatment well;Patient limited by fatigue;Other (comment) Patient left: in chair;with call bell/phone within reach;Other (comment) (BPs) Nurse Communication: Mobility status PT Visit Diagnosis: Other abnormalities of gait and mobility (R26.89);Pain Pain - Right/Left: Right     Time: 1610-9604 PT Time Calculation (min) (ACUTE ONLY): 27 min  Charges:    $Therapeutic Activity: 8-22 mins PT General  Charges $$ ACUTE PT VISIT: 1 Visit                     Darcus Eastern, PT  Acute Rehabilitation Services Office [phone] Secure Chat welcomed    Marcial Setting 06/24/2023, 11:44 AM

## 2023-06-24 NOTE — Progress Notes (Signed)
 Initial Nutrition Assessment  DOCUMENTATION CODES:   Not applicable  INTERVENTION:  Multivitamin w/ minerals daily 1 packet Juven BID, each packet provides 95 calories, 2.5 grams of protein (collagen), and 9.8 grams of carbohydrate (3 grams sugar); also contains 7 grams of L-arginine and L-glutamine, 300 mg vitamin C, 15 mg vitamin E, 1.2 mcg vitamin B-12, 9.5 mg zinc , 200 mg calcium , and 1.5 g  Calcium  Beta-hydroxy-Beta-methylbutyrate to support wound healing Liberalize pt diet to Carb Modified due to increased needs for wound healing.   NUTRITION DIAGNOSIS:   Increased nutrient needs related to wound healing as evidenced by estimated needs.  GOAL:   Patient will meet greater than or equal to 90% of their needs  MONITOR:   PO intake, Supplement acceptance, Skin, I & O's  REASON FOR ASSESSMENT:   Consult Assessment of nutrition requirement/status, Poor PO  ASSESSMENT:   68 y.o. male presented to the ED with nonhealing wound of ankle. Recent surgergical repair of R ankle on March 27th. PMH includes GERD, COPD, depression, CAD, CKD IV, T2DM, and HTN. Pt admitted with infected R ankle wound with hardware.   5/01 - Admitted   5/03 - Op s/p Removal of hardware R foot  Pt laying in chair at time of RD visit. Reports that appetite is the same now as it was PTA. Reports that he typically eats 2 meals per day at home, lunch and dinner. Enjoys foods like bananas, applesauce, and jello at home. Someone typically brings him his foods.   Meal Intake  5/02 - 50% x 2 meals  Reports a UBW of 315-220# prior to November, shares that he has lost 65# since then. Contributes his weight loss to not eating as good and not moving as much since his first surgery. Reports using a walker at home to ambulate.   Discussed adequate calorie and protein needs to assist in wound healing. Reviewed RD ordering Juven and explained the benefits associated. Pt agreeable and expressed understanding.    Medications reviewed and include: Vitamin D3, Colace, Ferrous Sulfate , NovoLog  SSI, Semglee , Magnesium  Oxide, Protonix , IV antibiotics  Labs reviewed: Folate 5.3, CRP 5.0 CBG:  107-166 x 24 hrs  NUTRITION - FOCUSED PHYSICAL EXAM:  Flowsheet Row Most Recent Value  Orbital Region No depletion  Upper Arm Region No depletion  Thoracic and Lumbar Region No depletion  Buccal Region No depletion  Temple Region No depletion  Clavicle Bone Region No depletion  Clavicle and Acromion Bone Region No depletion  Scapular Bone Region Unable to assess  Dorsal Hand No depletion  Patellar Region No depletion  Anterior Thigh Region Unable to assess  Posterior Calf Region No depletion  Edema (RD Assessment) None  Hair Reviewed  Eyes Reviewed  Mouth Reviewed  Skin Reviewed  Nails Reviewed   Diet Order:   Diet Order             Diet Carb Modified Fluid consistency: Thin; Room service appropriate? Yes with Assist  Diet effective now                   EDUCATION NEEDS:   Education needs have been addressed  Skin:  Skin Assessment: Reviewed RN Assessment Per WOC assessment on 5/02: 1. Unstageable Pressure Injury R sacrum/buttock 2. Stage 2 Pressure Injury L buttock  Last BM:  5/4  Height:  Ht Readings from Last 1 Encounters:  06/21/23 6' 0.99" (1.854 m)   Weight:  Wt Readings from Last 1 Encounters:  06/21/23 124.5 kg  Ideal Body Weight:  80.9 kg  BMI:  Body mass index is 36.22 kg/m.  Estimated Nutritional Needs:  Kcal:  2200-2400 Protein:  110-130 grams Fluid:  >/= 2 L   Doneta Furbish RD, LDN Clinical Dietitian

## 2023-06-24 NOTE — Plan of Care (Signed)
  Problem: Education: Goal: Knowledge of General Education information will improve Description: Including pain rating scale, medication(s)/side effects and non-pharmacologic comfort measures Outcome: Progressing   Problem: Clinical Measurements: Goal: Ability to maintain clinical measurements within normal limits will improve Outcome: Progressing Goal: Will remain free from infection Outcome: Progressing   Problem: Activity: Goal: Risk for activity intolerance will decrease Outcome: Progressing   Problem: Education: Goal: Individualized Educational Video(s) Outcome: Progressing

## 2023-06-24 NOTE — Progress Notes (Signed)
   06/24/23 1943  BiPAP/CPAP/SIPAP  Reason BIPAP/CPAP not in use Other(comment) (patient wears at home but does not want to wear one tonight.  Will call if he changes his mind.)  BiPAP/CPAP /SiPAP Vitals  Pulse Rate 67  Resp 18  SpO2 99 %  Bilateral Breath Sounds Diminished  MEWS Score/Color  MEWS Score 0  MEWS Score Color Green

## 2023-06-25 DIAGNOSIS — I739 Peripheral vascular disease, unspecified: Secondary | ICD-10-CM | POA: Diagnosis not present

## 2023-06-25 DIAGNOSIS — I272 Pulmonary hypertension, unspecified: Secondary | ICD-10-CM | POA: Diagnosis not present

## 2023-06-25 DIAGNOSIS — J449 Chronic obstructive pulmonary disease, unspecified: Secondary | ICD-10-CM | POA: Diagnosis not present

## 2023-06-25 DIAGNOSIS — L97314 Non-pressure chronic ulcer of right ankle with necrosis of bone: Secondary | ICD-10-CM | POA: Diagnosis not present

## 2023-06-25 LAB — CULTURE, BLOOD (ROUTINE X 2)
Culture: NO GROWTH
Special Requests: ADEQUATE

## 2023-06-25 LAB — RENAL FUNCTION PANEL
Albumin: 1.7 g/dL — ABNORMAL LOW (ref 3.5–5.0)
Anion gap: 10 (ref 5–15)
BUN: 14 mg/dL (ref 8–23)
CO2: 22 mmol/L (ref 22–32)
Calcium: 8.6 mg/dL — ABNORMAL LOW (ref 8.9–10.3)
Chloride: 102 mmol/L (ref 98–111)
Creatinine, Ser: 1.56 mg/dL — ABNORMAL HIGH (ref 0.61–1.24)
GFR, Estimated: 48 mL/min — ABNORMAL LOW (ref >60)
Glucose, Bld: 146 mg/dL — ABNORMAL HIGH (ref 70–99)
Phosphorus: 2.5 mg/dL (ref 2.5–4.6)
Potassium: 3.5 mmol/L (ref 3.5–5.1)
Sodium: 134 mmol/L — ABNORMAL LOW (ref 135–145)

## 2023-06-25 LAB — GLUCOSE, CAPILLARY
Glucose-Capillary: 125 mg/dL — ABNORMAL HIGH (ref 70–99)
Glucose-Capillary: 136 mg/dL — ABNORMAL HIGH (ref 70–99)
Glucose-Capillary: 128 mg/dL — ABNORMAL HIGH (ref 70–99)
Glucose-Capillary: 166 mg/dL — ABNORMAL HIGH (ref 70–99)
Glucose-Capillary: 164 mg/dL — ABNORMAL HIGH (ref 70–99)

## 2023-06-25 LAB — CBC
HCT: 27.5 % — ABNORMAL LOW (ref 39.0–52.0)
Hemoglobin: 9.1 g/dL — ABNORMAL LOW (ref 13.0–17.0)
MCH: 28.6 pg (ref 26.0–34.0)
MCHC: 33.1 g/dL (ref 30.0–36.0)
MCV: 86.5 fL (ref 80.0–100.0)
Platelets: 134 10*3/uL — ABNORMAL LOW (ref 150–400)
RBC: 3.18 MIL/uL — ABNORMAL LOW (ref 4.22–5.81)
RDW: 14.8 % (ref 11.5–15.5)
WBC: 5.9 10*3/uL (ref 4.0–10.5)
nRBC: 0 % (ref 0.0–0.2)

## 2023-06-25 LAB — MAGNESIUM: Magnesium: 1.9 mg/dL (ref 1.7–2.4)

## 2023-06-25 MED ORDER — ALLOPURINOL 100 MG PO TABS
100.0000 mg | ORAL_TABLET | Freq: Every day | ORAL | Status: DC
Start: 2023-06-26 — End: 2023-06-28
  Administered 2023-06-26 – 2023-06-28 (×3): 100 mg via ORAL
  Filled 2023-06-25 (×3): qty 1

## 2023-06-25 MED ORDER — SODIUM CHLORIDE 0.9% FLUSH: 10.0000 mL | INTRAVENOUS | Status: DC | PRN

## 2023-06-25 MED ORDER — PHENYLEPHRINE HCL-NACL 20-0.9 MG/250ML-% IV SOLN
INTRAVENOUS | Status: AC
  Filled 2023-06-25: qty 500

## 2023-06-25 MED ORDER — SODIUM CHLORIDE 0.9% FLUSH
10.0000 mL | Freq: Two times a day (BID) | INTRAVENOUS | Status: DC
  Administered 2023-06-25 – 2023-06-27 (×5): 10 mL
  Administered 2023-06-27: 20 mL
  Administered 2023-06-28: 10 mL

## 2023-06-25 MED ORDER — CHLORHEXIDINE GLUCONATE CLOTH 2 % EX PADS
6.0000 | MEDICATED_PAD | Freq: Every day | CUTANEOUS | Status: DC
  Administered 2023-06-25 – 2023-06-28 (×4): 6 via TOPICAL

## 2023-06-25 NOTE — Progress Notes (Signed)
 PROGRESS NOTE  Harold [name] UJW:[id number] DOB: [birth year]   PCP: Amin, Saad, MD  Patient is from: Home.  Lives with significant other.  DOA: 06/19/2023 LOS: 6  Chief complaints Chief Complaint  Patient presents with   Post-op Problem     Brief Narrative / Interim history: 68 year old M with PMH of CAD/PCI, CKD-4, COPD, PAH, OSA, DM with neuropathy, chronic venous insufficiency, orthostatic hypotension, HLD and diabetic foot ulcer, traumatic right ankle fracture in 12/2022 requiring hardware placement complicated by need for multiple surgeries and infection with Enterobacter cloacae bacteremia and recent right ankle arthrodesis on 3/27 presenting with pain, bleeding and exposed hardware.  No report of fever or constitutional symptoms.  He was on ciprofloxacin  outpatient.  In ED, stable vitals.  No leukocytosis.  CRP 5.0. Cr 2.18 (above baseline).  Hgb 8.8 (above baseline).  Right ankle x-ray showed postoperative changes with surgical hardware unchanged from prior, mildly displaced fracture deformity of the posterior tibial malleolus, degenerative changes in the ankle joint and first MPJ and soft tissue swelling over the dorsum of the foot.  Blood cultures obtained.  Started on broad-spectrum antibiotics.  Orthopedic surgery consulted   The next day, blood cultures NGTD.  Hgb dropped to 7.3.  Transfused 1 unit with appropriate response.  Underwent hardware removal of right foot on 5/3.    ID recommended PICC line and 6 weeks of ertapenem  and daptomycin .  PICC line placed on 5/7.  Medically stable for discharge pending SNF bed.   Subjective: Seen and examined earlier this morning.  No major events overnight or this morning.  Patient's spouse at bedside.  [facility].  He is concerned about the cost of SNF.  Otherwise, no concerns or complaints. Objective: Vitals:   06/24/23 1952 06/25/23 0443 06/25/23 0537 06/25/23 0758  BP: (!) 160/65 (!)  180/61 (!) 158/65 (!) 158/63  Pulse: 64 92  66  Resp: 17 18  20   Temp: 98.6 F (37 C) 97.8 F (36.6 C)  98 F (36.7 C)  TempSrc: Oral Oral  Oral  SpO2: 100% 100%  98%  Weight:      Height:        Examination:  GENERAL: No apparent distress.  Nontoxic. HEENT: MMM.  Vision and hearing grossly intact.  NECK: Supple.  No apparent JVD.  RESP:  No IWOB.  Fair aeration bilaterally. CVS:  RRR. Heart sounds normal.  ABD/GI/GU: BS+. Abd soft, NTND.  MSK/EXT:  Moves extremities.  Ace wrap over right foot. SKIN: Ace wrap over right foot. NEURO: Awake, alert and oriented appropriately.  No apparent focal neuro deficit. PSYCH: Calm. Normal affect.   Consultants:  Orthopedic surgery  Procedures: 5/3-right foot/ankle hardware removal by Dr. Curtiss Dowdy  Microbiology summarized: Blood cultures NGTD  Assessment and plan: Right ankle hardware infection: Patient with traumatic right ankle fracture in 12/2022 requiring hardware placement complicated by need for multiple surgeries and infection with Enterobacter cloacae bacteremia and recent right ankle arthrodesis on 3/27.  Seems  to be on Cipro  outpatient. -S/p right ankle hardware removal by Dr. Curtiss Dowdy on 5/3 -ID recommended PICC line and 6 weeks of antibiotics with ertapenem  and daptomycin . -PICC line placed on 5/7. -Pain control per Ortho-would minimize opiate given orthostatic hypotension -On home Plavix  and Eliquis  which would serve VTE prophylaxis -WBAT on RLE per Ortho -PT/OT-recommended SNF.  IDDM-2 with hyperglycemia, neuropathy and diabetic ulcer: A1c 7.0% in 12/2022 Recent Labs  Lab 06/24/23 1648 06/24/23 2128 06/25/23 0622 06/25/23 0800  06/25/23 1151  GLUCAP 133* 123* 125* 136* 128*  -Continue Semglee  15 units at night -Continue SSI-moderate -Continue home Cymbalta , gabapentin  and Lipitor -Repeat A1c not reliable after transfusion.   AKI on CKD-3A: thought to have CKD 4 but renal function improved tremendously  suggesting CKD-3A Recent Labs    05/19/23 0759 05/20/23 0614 05/21/23 0504 05/26/23 1420 06/19/23 2023 06/20/23 0114 06/20/23 0115 06/21/23 4098 06/22/23 0546 06/23/23 0959 06/25/23 0530  BUN 27* 26* 26* 33* 17  --  18 15 16 14 14   CREATININE 2.76* 2.62* 2.38* 2.47* 2.18* 2.12* 2.14* 2.05* 2.14* 1.90* 1.56*  -Continue monitoring - Avoid or minimize nephrotoxic meds  Paroxysmal A-fib: Rate controlled -Continue amiodarone  and Eliquis    PAH/OSA - CPAP at night - Minimize sedating medications   Orthostatic hypotension: Chronic.  Resting BP elevated. - Discontinued midodrine  - Elevate head of bed to > 30 degrees -TED hose to LLE and abdominal binder - OOB/PT/OT/fall precaution  Anemia of renal disease: Hgb dropped to 7.3 on 5/2.  Transfused 1 units with appropriate response.  Baseline Hgb about 8.0.  No nutritional deficiency on anemia panel Recent Labs    05/19/23 0759 05/20/23 0614 05/26/23 1420 05/26/23 1427 06/19/23 2023 06/20/23 0114 06/21/23 1191 06/22/23 0546 06/23/23 0654 06/25/23 0530  HGB 7.5* 8.6* 10.0* 9.5* 8.8* 7.3* 8.7* 8.2* 9.0* 9.1*  -Continue monitoring   PAD: On Plavix  and Lipitor - Continue home Lipitor and Plavix    Hypothyroidism:  -Continue levothyroxine   BPH - Continue home Flomax   GERD - Continue PPI  History of gout -Continue allopurinol   Class II obesity Body mass index is 36.22 kg/m.  Pressure skin injury: Present on arrival Pressure Injury 06/20/23 Sacrum Right Unstageable - Full thickness tissue loss in which the base of the injury is covered by slough (yellow, tan, gray, green or brown) and/or eschar (tan, brown or black) in the wound bed. (Active)  06/20/23 0050  Location: Sacrum  Location Orientation: Right  Staging: Unstageable - Full thickness tissue loss in which the base of the injury is covered by slough (yellow, tan, gray, green or brown) and/or eschar (tan, brown or black) in the wound bed.  Wound Description  (Comments):   Present on Admission: Yes  Dressing Type Foam - Lift dressing to assess site every shift 06/20/23 0358   DVT prophylaxis:  Place TED hose Start: 06/25/23 0957 SCDs Start: 06/21/23 0914 apixaban  (ELIQUIS ) tablet 5 mg  Code Status: Full code Family Communication: Updated patient's spouse at bedside Level of care: Med-Surg Status is: Inpatient Remains inpatient appropriate because: SNF bed and insurance authorization   Final disposition: SNF   35 minutes with more than 50% spent in reviewing records, counseling patient/family and coordinating care.   Sch Meds:  Scheduled Meds:  allopurinol   50 mg Oral Daily   amiodarone   200 mg Oral BID   apixaban   5 mg Oral BID   atorvastatin   40 mg Oral QHS   Chlorhexidine  Gluconate Cloth  6 each Topical Daily   cholecalciferol   2,000 Units Oral Daily   clopidogrel   75 mg Oral Daily   docusate sodium   100 mg Oral BID   DULoxetine   60 mg Oral BID   ezetimibe   10 mg Oral Daily   ferrous sulfate   325 mg Oral Once per day on Monday Wednesday Friday   gabapentin   300 mg Oral QHS   Gerhardt's butt cream   Topical BID   insulin  aspart  0-15 Units Subcutaneous TID WC   insulin  aspart  0-5 Units Subcutaneous QHS   insulin  glargine-yfgn  15 Units Subcutaneous QHS   leptospermum manuka honey  1 Application Topical Daily   levothyroxine   88 mcg Oral QAC breakfast   loratadine   10 mg Oral Daily   magnesium  oxide  400 mg Oral Daily   multivitamin with minerals  1 tablet Oral Daily   nutrition supplement (JUVEN)  1 packet Oral BID BM   pantoprazole   40 mg Oral Daily   sodium chloride  flush  10-40 mL Intracatheter Q12H   tamsulosin   0.4 mg Oral Daily   Continuous Infusions:  DAPTOmycin  800 mg (06/24/23 1530)   ertapenem  1 g (06/25/23 1144)   PRN Meds:.acetaminophen  **OR** acetaminophen , HYDROmorphone  (DILAUDID ) injection, methocarbamol , metoCLOPramide  **OR** metoCLOPramide  (REGLAN ) injection, nitroGLYCERIN , oxyCODONE , polyethylene  glycol, sodium chloride  flush  Antimicrobials: Anti-infectives (From admission, onward)    Start     Dose/Rate Route Frequency Ordered Stop   06/23/23 1030  ertapenem  (INVANZ ) 1 g in sodium chloride  0.9 % 100 mL IVPB        1 g 200 mL/hr over 30 Minutes Intravenous Every 24 hours 06/23/23 0928     06/21/23 2200  DAPTOmycin  (CUBICIN ) 800 mg in sodium chloride  0.9 % IVPB        8 mg/kg  97.7 kg (Adjusted) 132 mL/hr over 30 Minutes Intravenous Daily 06/20/23 1005     06/21/23 0716  ceFAZolin  (ANCEF ) 2-4 GM/100ML-% IVPB       Note to Pharmacy: Darrell Else D: cabinet override      06/21/23 0716 06/21/23 0757   06/21/23 0715  ceFAZolin  (ANCEF ) IVPB 2g/100 mL premix        2 g 200 mL/hr over 30 Minutes Intravenous  Once 06/21/23 0706 06/21/23 0753   06/20/23 0045  ciprofloxacin  (CIPRO ) tablet 500 mg  Status:  Discontinued        500 mg Oral 2 times daily 06/19/23 2358 06/20/23 1001   06/19/23 2316  vancomycin  (VANCOREADY) IVPB 2000 mg/400 mL  Status:  Discontinued        2,000 mg 200 mL/hr over 120 Minutes Intravenous Every 48 hours 06/19/23 2316 06/20/23 1005   06/19/23 2315  ceFEPIme  (MAXIPIME ) 2 g in sodium chloride  0.9 % 100 mL IVPB  Status:  Discontinued        2 g 200 mL/hr over 30 Minutes Intravenous Every 12 hours 06/19/23 2311 06/23/23 0928   06/19/23 2245  cefTRIAXone  (ROCEPHIN ) 2 g in sodium chloride  0.9 % 100 mL IVPB  Status:  Discontinued       Placed in "And" Linked Group   2 g 200 mL/hr over 30 Minutes Intravenous Once 06/19/23 2241 06/19/23 2311   06/19/23 2245  metroNIDAZOLE  (FLAGYL ) IVPB 500 mg       Placed in "And" Linked Group   500 mg 100 mL/hr over 60 Minutes Intravenous  Once 06/19/23 2241 06/20/23 0014   06/19/23 2245  vancomycin  (VANCOCIN ) IVPB 1000 mg/200 mL premix  Status:  Discontinued       Placed in "And" Linked Group   1,000 mg 200 mL/hr over 60 Minutes Intravenous  Once 06/19/23 2241 06/19/23 2243   06/19/23 2245  vancomycin  (VANCOREADY) IVPB 2000  mg/400 mL  Status:  Discontinued        2,000 mg 200 mL/hr over 120 Minutes Intravenous  Once 06/19/23 2244 06/19/23 2316        I have personally reviewed the following labs and images: CBC: Recent Labs  Lab 06/19/23 2023 06/20/23 0114 06/21/23 9629  06/22/23 0546 06/23/23 0654 06/25/23 0530  WBC 4.9 4.2 4.8 6.7 7.2 5.9  NEUTROABS 2.9  --   --   --   --   --   HGB 8.8* 7.3* 8.7* 8.2* 9.0* 9.1*  HCT 27.0* 22.1* 26.7* 25.3* 27.8* 27.5*  MCV 89.1 88.0 88.4 89.4 90.3 86.5  PLT 158 128* 135* 132* 126* 134*   BMP &GFR Recent Labs  Lab 06/20/23 0115 06/21/23 0633 06/22/23 0546 06/23/23 0959 06/25/23 0530  NA 136 137 135 138 134*  K 4.1 3.8 3.8 4.0 3.5  CL 104 108 106 106 102  CO2 22 22 22 26 22   GLUCOSE 149* 133* 204* 122* 146*  BUN 18 15 16 14 14   CREATININE 2.14* 2.05* 2.14* 1.90* 1.56*  CALCIUM  8.1* 8.2* 8.2* 8.6* 8.6*  MG  --  1.7 1.8  --  1.9  PHOS  --  3.1  --  2.8 2.5   Estimated Creatinine Clearance: 61.8 mL/min (A) (by C-G formula based on SCr of 1.56 mg/dL (H)). Liver & Pancreas: Recent Labs  Lab 06/19/23 2023 06/20/23 0115 06/21/23 1610 06/23/23 0959 06/25/23 0530  AST 34 18  --   --   --   ALT 9 12  --   --   --   ALKPHOS 111 94  --   --   --   BILITOT 1.3* 0.6  --   --   --   PROT 5.4* 4.7*  --   --   --   ALBUMIN  2.3* 2.0* 2.0* 1.8* 1.7*   No results for input(s): "LIPASE", "AMYLASE" in the last 168 hours. No results for input(s): "AMMONIA" in the last 168 hours. Diabetic: No results for input(s): "HGBA1C" in the last 72 hours. Recent Labs  Lab 06/24/23 1648 06/24/23 2128 06/25/23 0622 06/25/23 0800 06/25/23 1151  GLUCAP 133* 123* 125* 136* 128*   Cardiac Enzymes: Recent Labs  Lab 06/21/23 0633  CKTOTAL 36*   No results for input(s): "PROBNP" in the last 8760 hours. Coagulation Profile: No results for input(s): "INR", "PROTIME" in the last 168 hours. Thyroid  Function Tests: No results for input(s): "TSH", "T4TOTAL", "FREET4",  "T3FREE", "THYROIDAB" in the last 72 hours. Lipid Profile: No results for input(s): "CHOL", "HDL", "LDLCALC", "TRIG", "CHOLHDL", "LDLDIRECT" in the last 72 hours. Anemia Panel: No results for input(s): "VITAMINB12", "FOLATE", "FERRITIN", "TIBC", "IRON", "RETICCTPCT" in the last 72 hours.  Urine analysis:    Component Value Date/Time   COLORURINE YELLOW 04/15/2023 1732   APPEARANCEUR CLOUDY (A) 04/15/2023 1732   LABSPEC 1.014 04/15/2023 1732   PHURINE 5.0 04/15/2023 1732   GLUCOSEU >=500 (A) 04/15/2023 1732   HGBUR MODERATE (A) 04/15/2023 1732   BILIRUBINUR NEGATIVE 04/15/2023 1732   BILIRUBINUR negative 05/12/2018 1213   BILIRUBINUR small 06/29/2014 0859   KETONESUR NEGATIVE 04/15/2023 1732   PROTEINUR >=300 (A) 04/15/2023 1732   UROBILINOGEN 0.2 05/12/2018 1213   UROBILINOGEN 1.0 05/02/2014 2015   NITRITE NEGATIVE 04/15/2023 1732   LEUKOCYTESUR NEGATIVE 04/15/2023 1732   Sepsis Labs: Invalid input(s): "PROCALCITONIN", "LACTICIDVEN"  Microbiology: Recent Results (from the past 240 hours)  Culture, blood (routine x 2)     Status: None   Collection Time: 06/19/23  8:23 PM   Specimen: BLOOD  Result Value Ref Range Status   Specimen Description BLOOD LEFT ANTECUBITAL  Final   Special Requests   Final    BOTTLES DRAWN AEROBIC AND ANAEROBIC Blood Culture results may not be optimal due to an inadequate volume of blood  received in culture bottles   Culture   Final    NO GROWTH 5 DAYS Performed at [facility], [address], [place], [place] [address]    Report Status 06/24/2023 FINAL  Final  Culture, blood (routine x 2)     Status: None   Collection Time: 06/20/23  1:14 AM   Specimen: BLOOD LEFT HAND  Result Value Ref Range Status   Specimen Description BLOOD LEFT HAND  Final   Special Requests   Final    BOTTLES DRAWN AEROBIC AND ANAEROBIC Blood Culture adequate volume   Culture   Final    NO GROWTH 5 DAYS Performed at [facility], [address],  [place], [place] [address]    Report Status 06/25/2023 FINAL  Final  Surgical pcr screen     Status: None   Collection Time: 06/21/23  5:53 AM   Specimen: Nasal Mucosa; Nasal Swab  Result Value Ref Range Status   MRSA, PCR NEGATIVE NEGATIVE Final   Staphylococcus aureus NEGATIVE NEGATIVE Final    Comment: (NOTE) The Xpert SA Assay (FDA approved for NASAL specimens in patients 97 years of age and older), is one component of a comprehensive surveillance program. It is not intended to diagnose infection nor to guide or monitor treatment. Performed at [facility], [address] [place], [place] [address]     Radiology Studies: No results found.      Kaleiyah Polsky T. Jakya Dovidio [facility]  If 7PM-7AM, please contact night-coverage [email or web address] 06/25/2023, 1:58 PM

## 2023-06-25 NOTE — TOC Progression Note (Signed)
 Transition of Care [facility]) - Progression Note    Patient Details  Name: KEMARI BALBIN MRN: [id number] Date of Birth: 03/22/1955  Transition of Care [facility]) CM/SW Contact  Omie Bickers, RN Phone Number: 06/25/2023, 11:20 AM  Clinical Narrative:     Per progression report, significant other is requesting to have patient screened by [facility].  [facility] has reviewed the case and the patient does not have the revenue codes (ICU) required by [facility] for insurance authorization.  Updated patient and CSW  Expected Discharge Plan: Skilled Nursing Facility Barriers to Discharge: English as a second language teacher, SNF Pending bed offer, Continued Medical Work up  Expected Discharge Plan and Services                                               Social Determinants of Health (SDOH) Interventions SDOH Screenings   Food Insecurity: No Food Insecurity (06/20/2023)  Housing: Low Risk  (06/20/2023)  Transportation Needs: No Transportation Needs (06/20/2023)  Utilities: Not At Risk (06/20/2023)  Social Connections: Moderately Integrated (06/20/2023)  Tobacco Use: Medium Risk (06/21/2023)    Readmission Risk Interventions     No data to display

## 2023-06-25 NOTE — Plan of Care (Signed)

## 2023-06-25 NOTE — Progress Notes (Signed)
 Peripherally Inserted Central Catheter Placement  The IV Nurse has discussed with the patient and/or persons authorized to consent for the patient, the purpose of this procedure and the potential benefits and risks involved with this procedure.  The benefits include less needle sticks, lab draws from the catheter, and the patient may be discharged home with the catheter. Risks include, but not limited to, infection, bleeding, blood clot (thrombus formation), and puncture of an artery; nerve damage and irregular heartbeat and possibility to perform a PICC exchange if needed/ordered by physician.  Alternatives to this procedure were also discussed.  Bard Power PICC patient education guide, fact sheet on infection prevention and patient information card has been provided to patient /or left at bedside.    PICC Placement Documentation  PICC Single Lumen 06/25/23 Right Brachial 41 cm 0 cm (Active)  Indication for Insertion or Continuance of Line Prolonged intravenous therapies 06/25/23 0926  Exposed Catheter (cm) 0 cm 06/25/23 0926  Site Assessment Clean, Dry, Intact 06/25/23 0926  Line Status Flushed;Saline locked;Blood return noted 06/25/23 0926  Dressing Type Transparent;Securing device 06/25/23 0926  Dressing Status Antimicrobial disc/dressing in place;Clean, Dry, Intact 06/25/23 0926  Line Care Connections checked and tightened 06/25/23 0926  Line Adjustment (NICU/IV Team Only) No 06/25/23 0926  Dressing Intervention New dressing;Adhesive placed at insertion site (IV team only) 06/25/23 0926  Dressing Change Due 07/02/23 06/25/23 0926       Nadean August 06/25/2023, 9:27 AM

## 2023-06-25 NOTE — Progress Notes (Signed)
 Physical Therapy Treatment Patient Details Name: Harold [name] MRN: [id number] DOB: [birth year] Today's Date: 06/25/2023   History of Present Illness 68 y.o. male admitted 06/19/23 with R heel wound and exposed hardware, also with hypotension and seizure-like activity at home per [facility]. S/p hardware removal R foot on 5/3. Of note, h/o R ankle fx s/p R ankle/subtalar arthrodesis 05/15/23. Other PMH includes HTN, HLD, CAD, COPD, DM2, CKD, PVD, gout, afib, obesity, neuropathy.    PT Comments  Continuing work on functional mobility and activity tolerance;  [name] had more dizziness sitting EOB today, with BP SBP drop from 128 mmHg supine to 82 mmHg in upright sitting; Maintained sitting EOB for 20 minutes without further SBP drop, and we opted to push for getting up for the benefit of stimulating up-regulation of his BP in more upright positions; See other PT note of this date for more detials;   Manson's husband, [name] was present for session and voiced concerns re: getting home (which they may need to choose because of financial concerns); For safety and due to fall risk, likely that [name] will need to be at wheelchair transfer level; Recommend wheelchair (likely 22-24 inch wide) with cushion, drop-arm BSC, and potentially an ambualnce transport home   If plan is discharge home, recommend the following: Two people to help with walking and/or transfers;A lot of help with bathing/dressing/bathroom;Assistance with cooking/housework;Assist for transportation;Help with stairs or ramp for entrance   Can travel by private vehicle     No  Equipment Recommendations   (TBD - potential hospital bed, smaller w/c size)    Recommendations for Other Services       Precautions / Restrictions Precautions Precautions: Fall Recall of Precautions/Restrictions: Intact Restrictions Weight Bearing Restrictions Per Provider Order: Yes RLE Weight Bearing Per Provider Order: Weight bearing as tolerated      Mobility  Bed Mobility Overal bed mobility: Needs Assistance Bed Mobility: Supine to Sit Rolling: Min assist   Supine to sit: Mod assist     General bed mobility comments: Mod assist to elevate trunk and scoot EOB    Transfers Overall transfer level: Needs assistance Equipment used: Rolling walker (2 wheels) Transfers: Bed to chair/wheelchair/BSC, Sit to/from Stand Sit to Stand: Mod assist, +2 physical assistance, +2 safety/equipment   Step pivot transfers: Mod assist, +2 safety/equipment       General transfer comment: BPs already low in sitting, so tried lateral scooting first; inefficient scoots adn pt beginniing to fatigue; Pt able to come to standing and complete transfer, pt unsteady and c/o of orthostatic symptoms; dependent on RW for steadiness    Ambulation/Gait                   Stairs             Wheelchair Mobility     Tilt Bed    Modified Rankin (Stroke Patients Only)       Balance Overall balance assessment: Needs assistance Sitting-balance support: Feet supported, Bilateral upper extremity supported, Single extremity supported Sitting balance-Leahy Scale: Poor Sitting balance - Comments: Reliant ton UE support, min to CGA   Standing balance support: Bilateral upper extremity supported, During functional activity, Reliant on assistive device for balance Standing balance-Leahy Scale: Poor Standing balance comment: Reliant on RW                            Communication Communication Communication: No apparent difficulties  Cognition Arousal: Alert Behavior During  Therapy: WFL for tasks assessed/performed                             Following commands: Intact      Cueing Cueing Techniques: Verbal cues  Exercises      General Comments General comments (skin integrity, edema, etc.): Reported lightheadedness which progressed to dizziness with incr time in sitting; wee other note of this date       Pertinent Vitals/Pain Pain Assessment Pain Assessment: Faces Faces Pain Scale: Hurts a little bit Pain Location: R ankle Pain Descriptors / Indicators: Sore Pain Intervention(s): Monitored during session    Home Living                          Prior Function            PT Goals (current goals can now be found in the care plan section) Acute Rehab PT Goals Patient Stated Goal: return home, decreased pain PT Goal Formulation: With patient Time For Goal Achievement: 07/06/23 Potential to Achieve Goals: Good Progress towards PT goals: Progressing toward goals    Frequency    Min 2X/week      PT Plan      Co-evaluation              AM-PAC PT "6 Clicks" Mobility   Outcome Measure  Help needed turning from your back to your side while in a flat bed without using bedrails?: A Little Help needed moving from lying on your back to sitting on the side of a flat bed without using bedrails?: A Lot Help needed moving to and from a bed to a chair (including a wheelchair)?: A Lot Help needed standing up from a chair using your arms (e.g., wheelchair or bedside chair)?: Total Help needed to walk in hospital room?: Total Help needed climbing 3-5 steps with a railing? : Total 6 Click Score: 10    End of Session Equipment Utilized During Treatment: Gait belt Activity Tolerance: Patient tolerated treatment well;Patient limited by fatigue;Other (comment) Patient left: in chair;with call bell/phone within reach;Other (comment) (BPs) Nurse Communication: Mobility status PT Visit Diagnosis: Other abnormalities of gait and mobility (R26.89);Pain Pain - Right/Left: Right     Time: 6045-4098 PT Time Calculation (min) (ACUTE ONLY): 36 min  Charges:    $Therapeutic Activity: 23-37 mins PT General Charges $$ ACUTE PT VISIT: 1 Visit                     Darcus Eastern, PT  Acute Rehabilitation Services Office [phone] Secure Chat welcomed    Marcial Setting 06/25/2023, 2:46 PM

## 2023-06-25 NOTE — TOC Progression Note (Addendum)
 Transition of Care [facility]) - Progression Note    Patient Details  Name: Harold [name] MRN: [id number] Date of Birth: 04/05/1955  Transition of Care [facility]) CM/SW Contact  Katrinka Parr, [place] Phone Number: 06/25/2023, 4:19 PM  Clinical Narrative:     Pt is in copay days and cannot afford copays up front. CSW contacted SNF offers to clarify their policy on copay days.   [facility] and [facility] do not need copay payment upfront to admit. CSW called pt's spouse and updated him. Texted the names of the facility to spouse to review. CSW also contacted [facility] [facility] as pt and spouse would be interested in [facility] that offers PT/OT 6-7x/week. Shelvy Dickens with [facility] will contact spouse to discuss. Shelvy Dickens informed CSW that [facility] may be able to offer a bed as well despite copays. CSW also answers questions about DME. Spouse interested in condom caths at home; CSW to provide info for spouse to privately purchase condom cath supplies. TOC will continue to follow.   Expected Discharge Plan: Skilled Nursing Facility Barriers to Discharge: Insurance Authorization, SNF Pending bed offer, Continued Medical Work up          Social Determinants of Health (SDOH) Interventions SDOH Screenings   Food Insecurity: No Food Insecurity (06/20/2023)  Housing: Low Risk  (06/20/2023)  Transportation Needs: No Transportation Needs (06/20/2023)  Utilities: Not At Risk (06/20/2023)  Social Connections: Moderately Integrated (06/20/2023)  Tobacco Use: Medium Risk (06/21/2023)    Readmission Risk Interventions     No data to display

## 2023-06-25 NOTE — Progress Notes (Addendum)
 Physical Therapy Treatment Note  Clinical Impression:  Continuing work on functional mobility and activity tolerance;   Took serial BPs today; details as follows:     06/25/23 1300 06/25/23 1302 06/25/23 1305  Orthostatic Lying   BP- Lying 128/58  --   --   Pulse- Lying 67  --   --   Orthostatic Sitting  BP- Sitting (!) 82/47 (!) 88/53 (!) 82/67  Pulse- Sitting 70 73 148 (immediately after stand pivot transfer; in recliner, feet down)    06/25/23 1308  Orthostatic Lying   BP- Lying  --   Pulse- Lying  --   Orthostatic Sitting  BP- Sitting 116/60; End of session  Pulse- Sitting 66 (reclined in recliner, feet up)   (Full PT session note to follow)  Darcus Eastern, PT  Acute Rehabilitation Services Office [phone] Secure Chat welcomed

## 2023-06-26 DIAGNOSIS — J449 Chronic obstructive pulmonary disease, unspecified: Secondary | ICD-10-CM | POA: Diagnosis not present

## 2023-06-26 DIAGNOSIS — T847XXA Infection and inflammatory reaction due to other internal orthopedic prosthetic devices, implants and grafts, initial encounter: Secondary | ICD-10-CM | POA: Diagnosis not present

## 2023-06-26 DIAGNOSIS — I272 Pulmonary hypertension, unspecified: Secondary | ICD-10-CM | POA: Diagnosis not present

## 2023-06-26 DIAGNOSIS — I739 Peripheral vascular disease, unspecified: Secondary | ICD-10-CM | POA: Diagnosis not present

## 2023-06-26 LAB — GLUCOSE, CAPILLARY
Glucose-Capillary: 136 mg/dL — ABNORMAL HIGH (ref 70–99)
Glucose-Capillary: 138 mg/dL — ABNORMAL HIGH (ref 70–99)
Glucose-Capillary: 134 mg/dL — ABNORMAL HIGH (ref 70–99)
Glucose-Capillary: 119 mg/dL — ABNORMAL HIGH (ref 70–99)

## 2023-06-26 NOTE — Progress Notes (Signed)
 PROGRESS NOTE  Harold [name] VOZ:[id number] DOB: 02/10/1956   PCP: Amin, Saad, MD  Patient is from: Home.  Lives with significant other.  DOA: 06/19/2023 LOS: 7  Chief complaints Chief Complaint  Patient presents with   Post-op Problem     Brief Narrative / Interim history: 68 year old M with PMH of CAD/PCI, CKD-4, COPD, PAH, OSA, DM with neuropathy, chronic venous insufficiency, orthostatic hypotension, HLD and diabetic foot ulcer, traumatic right ankle fracture in 12/2022 requiring hardware placement complicated by need for multiple surgeries and infection with Enterobacter cloacae bacteremia and recent right ankle arthrodesis on 3/27 presenting with pain, bleeding and exposed hardware.  No report of fever or constitutional symptoms.  He was on ciprofloxacin  outpatient.  In ED, stable vitals.  No leukocytosis.  CRP 5.0. Cr 2.18 (above baseline).  Hgb 8.8 (above baseline).  Right ankle x-ray showed postoperative changes with surgical hardware unchanged from prior, mildly displaced fracture deformity of the posterior tibial malleolus, degenerative changes in the ankle joint and first MPJ and soft tissue swelling over the dorsum of the foot.  Blood cultures obtained.  Started on broad-spectrum antibiotics.  Orthopedic surgery consulted   The next day, blood cultures NGTD.  Hgb dropped to 7.3.  Transfused 1 unit with appropriate response.  Underwent hardware removal of right foot on 5/3.    ID recommended PICC line and 6 weeks of ertapenem  and daptomycin .  PICC line placed on 5/7.  Medically stable for discharge pending SNF bed.   Subjective: Seen and examined earlier this morning.  No major events overnight or this morning.  Patient's spouse at bedside.  [facility] on [facility].  He is concerned about the cost of SNF.  Otherwise, no concerns or complaints. Objective: Vitals:   06/25/23 1533 06/25/23 1953 06/26/23 0424 06/26/23 0827  BP: (!) 138/57 (!)  161/68 (!) 172/66 (!) 160/69  Pulse: 63 65 65 64  Resp: 20 18 18 18   Temp: 98.1 F (36.7 C) 98.4 F (36.9 C) (!) 97.5 F (36.4 C) 97.8 F (36.6 C)  TempSrc:  Oral Oral Oral  SpO2: 100% 100% 100% 100%  Weight:      Height:        Examination:  GENERAL: No apparent distress.  Nontoxic. HEENT: MMM.  Vision and hearing grossly intact.  NECK: Supple.  No apparent JVD.  RESP:  No IWOB.  Fair aeration bilaterally. CVS:  RRR. Heart sounds normal.  ABD/GI/GU: BS+. Abd soft, NTND.  MSK/EXT:  Moves extremities.  Ace wrap over right foot. SKIN: Ace wrap over right foot. NEURO: Awake, alert and oriented appropriately.  No apparent focal neuro deficit. PSYCH: Calm. Normal affect.   Consultants:  Orthopedic surgery  Procedures: 5/3-right foot/ankle hardware removal by Dr. Curtiss Dowdy  Microbiology summarized: Blood cultures NGTD  Assessment and plan: Right ankle hardware infection: Patient with traumatic right ankle fracture in 12/2022 requiring hardware placement complicated by need for multiple surgeries and infection with Enterobacter cloacae bacteremia and recent right ankle arthrodesis on 3/27.  Seems  to be on Cipro  outpatient. -S/p right ankle hardware removal by Dr. Curtiss Dowdy on 5/3 -ID recommended PICC line and 6 weeks of antibiotics with ertapenem  and daptomycin . -PICC line placed on 5/7. -Pain control per Ortho-would minimize opiate given orthostatic hypotension -On home Plavix  and Eliquis  which would serve VTE prophylaxis -WBAT on RLE per Ortho -PT/OT-recommended SNF.  IDDM-2 with hyperglycemia, neuropathy and diabetic ulcer: A1c 7.0% in 12/2022 Recent Labs  Lab 06/25/23 1151 06/25/23 1700  06/25/23 1953 06/26/23 0619 06/26/23 1113  GLUCAP 128* 166* 164* 136* 138*  -Continue Semglee  15 units at night -Continue SSI-moderate -Continue home Cymbalta , gabapentin  and Lipitor -Repeat A1c not reliable after transfusion.   AKI on CKD-3A: thought to have CKD 4 but renal  function improved tremendously suggesting CKD-3A Recent Labs    05/19/23 0759 05/20/23 0614 05/21/23 0504 05/26/23 1420 06/19/23 2023 06/20/23 0114 06/20/23 0115 06/21/23 1610 06/22/23 0546 06/23/23 0959 06/25/23 0530  BUN 27* 26* 26* 33* 17  --  18 15 16 14 14   CREATININE 2.76* 2.62* 2.38* 2.47* 2.18* 2.12* 2.14* 2.05* 2.14* 1.90* 1.56*  -Continue monitoring - Avoid or minimize nephrotoxic meds  Paroxysmal A-fib: Rate controlled -Continue amiodarone  and Eliquis    PAH/OSA - CPAP at night - Minimize sedating medications   Orthostatic hypotension: Chronic.  Resting SBP elevated but drops to 80s with sitting up -Elevate head of bed to > 30 degrees -TED hose to LLE and abdominal binder.  Discussed with RN. -OOB/PT/OT/fall precaution  Anemia of renal disease: Hgb dropped to 7.3 on 5/2.  Transfused 1 units with appropriate response.  Baseline Hgb about 8.0.  No nutritional deficiency on anemia panel Recent Labs    05/19/23 0759 05/20/23 0614 05/26/23 1420 05/26/23 1427 06/19/23 2023 06/20/23 0114 06/21/23 9604 06/22/23 0546 06/23/23 0654 06/25/23 0530  HGB 7.5* 8.6* 10.0* 9.5* 8.8* 7.3* 8.7* 8.2* 9.0* 9.1*  -Continue monitoring   PAD: On Plavix  and Lipitor - Continue home Lipitor and Plavix    Hypothyroidism:  -Continue levothyroxine   BPH - Continue home Flomax   GERD - Continue PPI  History of gout -Continue allopurinol   Class II obesity Body mass index is 36.22 kg/m.  Pressure skin injury: Present on arrival Pressure Injury 06/20/23 Sacrum Right Unstageable - Full thickness tissue loss in which the base of the injury is covered by slough (yellow, tan, gray, green or brown) and/or eschar (tan, brown or black) in the wound bed. (Active)  06/20/23 0050  Location: Sacrum  Location Orientation: Right  Staging: Unstageable - Full thickness tissue loss in which the base of the injury is covered by slough (yellow, tan, gray, green or brown) and/or eschar (tan,  brown or black) in the wound bed.  Wound Description (Comments):   Present on Admission: Yes  Dressing Type Foam - Lift dressing to assess site every shift 06/20/23 0358   DVT prophylaxis:  Place TED hose Start: 06/25/23 0957 SCDs Start: 06/21/23 0914 apixaban  (ELIQUIS ) tablet 5 mg  Code Status: Full code Family Communication: None at bedside today. Level of care: Med-Surg Status is: Inpatient Remains inpatient appropriate because: SNF bed and insurance authorization   Final disposition: SNF   35 minutes with more than 50% spent in reviewing records, counseling patient/family and coordinating care.   Sch Meds:  Scheduled Meds:  allopurinol   100 mg Oral Daily   amiodarone   200 mg Oral BID   apixaban   5 mg Oral BID   atorvastatin   40 mg Oral QHS   Chlorhexidine  Gluconate Cloth  6 each Topical Daily   cholecalciferol   2,000 Units Oral Daily   clopidogrel   75 mg Oral Daily   docusate sodium   100 mg Oral BID   DULoxetine   60 mg Oral BID   ezetimibe   10 mg Oral Daily   ferrous sulfate   325 mg Oral Once per day on Monday Wednesday Friday   gabapentin   300 mg Oral QHS   Gerhardt's butt cream   Topical BID   insulin  aspart  0-15 Units Subcutaneous TID WC   insulin  aspart  0-5 Units Subcutaneous QHS   insulin  glargine-yfgn  15 Units Subcutaneous QHS   leptospermum manuka honey  1 Application Topical Daily   levothyroxine   88 mcg Oral QAC breakfast   loratadine   10 mg Oral Daily   magnesium  oxide  400 mg Oral Daily   multivitamin with minerals  1 tablet Oral Daily   nutrition supplement (JUVEN)  1 packet Oral BID BM   pantoprazole   40 mg Oral Daily   sodium chloride  flush  10-40 mL Intracatheter Q12H   tamsulosin   0.4 mg Oral Daily   Continuous Infusions:  DAPTOmycin  800 mg (06/25/23 1446)   ertapenem  1 g (06/26/23 1025)   PRN Meds:.acetaminophen  **OR** acetaminophen , HYDROmorphone  (DILAUDID ) injection, methocarbamol , metoCLOPramide  **OR** metoCLOPramide  (REGLAN ) injection,  nitroGLYCERIN , oxyCODONE , polyethylene glycol, sodium chloride  flush  Antimicrobials: Anti-infectives (From admission, onward)    Start     Dose/Rate Route Frequency Ordered Stop   06/23/23 1030  ertapenem  (INVANZ ) 1 g in sodium chloride  0.9 % 100 mL IVPB        1 g 200 mL/hr over 30 Minutes Intravenous Every 24 hours 06/23/23 0928     06/21/23 2200  DAPTOmycin  (CUBICIN ) 800 mg in sodium chloride  0.9 % IVPB        8 mg/kg  97.7 kg (Adjusted) 132 mL/hr over 30 Minutes Intravenous Daily 06/20/23 1005     06/21/23 0716  ceFAZolin  (ANCEF ) 2-4 GM/100ML-% IVPB       Note to Pharmacy: Darrell Else D: cabinet override      06/21/23 0716 06/21/23 0757   06/21/23 0715  ceFAZolin  (ANCEF ) IVPB 2g/100 mL premix        2 g 200 mL/hr over 30 Minutes Intravenous  Once 06/21/23 0706 06/21/23 0753   06/20/23 0045  ciprofloxacin  (CIPRO ) tablet 500 mg  Status:  Discontinued        500 mg Oral 2 times daily 06/19/23 2358 06/20/23 1001   06/19/23 2316  vancomycin  (VANCOREADY) IVPB 2000 mg/400 mL  Status:  Discontinued        2,000 mg 200 mL/hr over 120 Minutes Intravenous Every 48 hours 06/19/23 2316 06/20/23 1005   06/19/23 2315  ceFEPIme  (MAXIPIME ) 2 g in sodium chloride  0.9 % 100 mL IVPB  Status:  Discontinued        2 g 200 mL/hr over 30 Minutes Intravenous Every 12 hours 06/19/23 2311 06/23/23 0928   06/19/23 2245  cefTRIAXone  (ROCEPHIN ) 2 g in sodium chloride  0.9 % 100 mL IVPB  Status:  Discontinued       Placed in "And" Linked Group   2 g 200 mL/hr over 30 Minutes Intravenous Once 06/19/23 2241 06/19/23 2311   06/19/23 2245  metroNIDAZOLE  (FLAGYL ) IVPB 500 mg       Placed in "And" Linked Group   500 mg 100 mL/hr over 60 Minutes Intravenous  Once 06/19/23 2241 06/20/23 0014   06/19/23 2245  vancomycin  (VANCOCIN ) IVPB 1000 mg/200 mL premix  Status:  Discontinued       Placed in "And" Linked Group   1,000 mg 200 mL/hr over 60 Minutes Intravenous  Once 06/19/23 2241 06/19/23 2243   06/19/23  2245  vancomycin  (VANCOREADY) IVPB 2000 mg/400 mL  Status:  Discontinued        2,000 mg 200 mL/hr over 120 Minutes Intravenous  Once 06/19/23 2244 06/19/23 2316        I have personally reviewed the following labs and images: CBC:  Recent Labs  Lab 06/19/23 2023 06/20/23 0114 06/21/23 2956 06/22/23 0546 06/23/23 0654 06/25/23 0530  WBC 4.9 4.2 4.8 6.7 7.2 5.9  NEUTROABS 2.9  --   --   --   --   --   HGB 8.8* 7.3* 8.7* 8.2* 9.0* 9.1*  HCT 27.0* 22.1* 26.7* 25.3* 27.8* 27.5*  MCV 89.1 88.0 88.4 89.4 90.3 86.5  PLT 158 128* 135* 132* 126* 134*   BMP &GFR Recent Labs  Lab 06/20/23 0115 06/21/23 0633 06/22/23 0546 06/23/23 0959 06/25/23 0530  NA 136 137 135 138 134*  K 4.1 3.8 3.8 4.0 3.5  CL 104 108 106 106 102  CO2 22 22 22 26 22   GLUCOSE 149* 133* 204* 122* 146*  BUN 18 15 16 14 14   CREATININE 2.14* 2.05* 2.14* 1.90* 1.56*  CALCIUM  8.1* 8.2* 8.2* 8.6* 8.6*  MG  --  1.7 1.8  --  1.9  PHOS  --  3.1  --  2.8 2.5   Estimated Creatinine Clearance: 61.8 mL/min (A) (by C-G formula based on SCr of 1.56 mg/dL (H)). Liver & Pancreas: Recent Labs  Lab 06/19/23 2023 06/20/23 0115 06/21/23 2130 06/23/23 0959 06/25/23 0530  AST 34 18  --   --   --   ALT 9 12  --   --   --   ALKPHOS 111 94  --   --   --   BILITOT 1.3* 0.6  --   --   --   PROT 5.4* 4.7*  --   --   --   ALBUMIN  2.3* 2.0* 2.0* 1.8* 1.7*   No results for input(s): "LIPASE", "AMYLASE" in the last 168 hours. No results for input(s): "AMMONIA" in the last 168 hours. Diabetic: No results for input(s): "HGBA1C" in the last 72 hours. Recent Labs  Lab 06/25/23 1151 06/25/23 1700 06/25/23 1953 06/26/23 0619 06/26/23 1113  GLUCAP 128* 166* 164* 136* 138*   Cardiac Enzymes: Recent Labs  Lab 06/21/23 0633  CKTOTAL 36*   No results for input(s): "PROBNP" in the last 8760 hours. Coagulation Profile: No results for input(s): "INR", "PROTIME" in the last 168 hours. Thyroid  Function Tests: No results for  input(s): "TSH", "T4TOTAL", "FREET4", "T3FREE", "THYROIDAB" in the last 72 hours. Lipid Profile: No results for input(s): "CHOL", "HDL", "LDLCALC", "TRIG", "CHOLHDL", "LDLDIRECT" in the last 72 hours. Anemia Panel: No results for input(s): "VITAMINB12", "FOLATE", "FERRITIN", "TIBC", "IRON", "RETICCTPCT" in the last 72 hours.  Urine analysis:    Component Value Date/Time   COLORURINE YELLOW 04/15/2023 1732   APPEARANCEUR CLOUDY (A) 04/15/2023 1732   LABSPEC 1.014 04/15/2023 1732   PHURINE 5.0 04/15/2023 1732   GLUCOSEU >=500 (A) 04/15/2023 1732   HGBUR MODERATE (A) 04/15/2023 1732   BILIRUBINUR NEGATIVE 04/15/2023 1732   BILIRUBINUR negative 05/12/2018 1213   BILIRUBINUR small 06/29/2014 0859   KETONESUR NEGATIVE 04/15/2023 1732   PROTEINUR >=300 (A) 04/15/2023 1732   UROBILINOGEN 0.2 05/12/2018 1213   UROBILINOGEN 1.0 05/02/2014 2015   NITRITE NEGATIVE 04/15/2023 1732   LEUKOCYTESUR NEGATIVE 04/15/2023 1732   Sepsis Labs: Invalid input(s): "PROCALCITONIN", "LACTICIDVEN"  Microbiology: Recent Results (from the past 240 hours)  Culture, blood (routine x 2)     Status: None   Collection Time: 06/19/23  8:23 PM   Specimen: BLOOD  Result Value Ref Range Status   Specimen Description BLOOD LEFT ANTECUBITAL  Final   Special Requests   Final    BOTTLES DRAWN AEROBIC AND ANAEROBIC Blood Culture results may  not be optimal due to an inadequate volume of blood received in culture bottles   Culture   Final    NO GROWTH 5 DAYS Performed at [facility], [address] [place], [place] [address]    Report Status 06/24/2023 FINAL  Final  Culture, blood (routine x 2)     Status: None   Collection Time: 06/20/23  1:14 AM   Specimen: BLOOD LEFT HAND  Result Value Ref Range Status   Specimen Description BLOOD LEFT HAND  Final   Special Requests   Final    BOTTLES DRAWN AEROBIC AND ANAEROBIC Blood Culture adequate volume   Culture   Final    NO GROWTH 5 DAYS Performed at [facility], [address] [place], [place] [address]    Report Status 06/25/2023 FINAL  Final  Surgical pcr screen     Status: None   Collection Time: 06/21/23  5:53 AM   Specimen: Nasal Mucosa; Nasal Swab  Result Value Ref Range Status   MRSA, PCR NEGATIVE NEGATIVE Final   Staphylococcus aureus NEGATIVE NEGATIVE Final    Comment: (NOTE) The Xpert SA Assay (FDA approved for NASAL specimens in patients 80 years of age and older), is one component of a comprehensive surveillance program. It is not intended to diagnose infection nor to guide or monitor treatment. Performed at [facility], [address], [place], [place] [address]     Radiology Studies: No results found.      Chloe Bluett T. Jahrell Hamor [facility]  If 7PM-7AM, please contact night-coverage [email or web address] 06/26/2023, 2:26 PM

## 2023-06-26 NOTE — TOC Progression Note (Signed)
 Transition of Care [facility]) - Progression Note    Patient Details  Name: Harold [name] MRN: [id number] Date of Birth: 1955-11-24  [facility] CM/SW Contact  Katrinka Parr, [place] Phone Number: 06/26/2023, 3:18 PM  Clinical Narrative:     CSW spoke with pt's spouse; he plans to visit [facility] later this afternoon. He will follow up with CSW for SNF choice.   Expected Discharge Plan: Skilled Nursing Facility Barriers to Discharge: Insurance Authorization, SNF Pending bed offer, Continued Medical Work up                    Social Determinants of Health (SDOH) Interventions SDOH Screenings   Food Insecurity: No Food Insecurity (06/20/2023)  Housing: Low Risk  (06/20/2023)  Transportation Needs: No Transportation Needs (06/20/2023)  Utilities: Not At Risk (06/20/2023)  Social Connections: Moderately Integrated (06/20/2023)  Tobacco Use: Medium Risk (06/21/2023)    Readmission Risk Interventions     No data to display

## 2023-06-26 NOTE — Progress Notes (Signed)
 Physical Therapy Treatment Patient Details Name: Harold [name] MRN: [id number] DOB: 04/05/1955 Today's Date: 06/26/2023   History of Present Illness 68 y.o. male admitted 06/19/23 with R heel wound and exposed hardware, also with hypotension and seizure-like activity at home per [facility]. S/p hardware removal R foot on 5/3. Of note, h/o R ankle fx s/p R ankle/subtalar arthrodesis 05/15/23. Other PMH includes HTN, HLD, CAD, COPD, DM2, CKD, PVD, gout, afib, obesity, neuropathy.    PT Comments  Patient resting in bed and agreeable to mobilize with therapy. BP monitored throughout (see below). Pt requires min assist with good initiation to reach for bed rail to roll Rt/Lt for bed pan removal and pericare. Mod assist with supine>sit but pt initiated bringing Le's off EOB and press up to raise trunk. Posterior lean in sitting and cues needed to shift weight anterior to fade to CGA for seated balance. BP with drop but recovered with donning of Abd Binder. Sit>stand completed with pivot/steps to recliner and immediate return to sit, pt denied symptoms during transfer but BP dropped further. Mod +2 required with heavy posterior lean in standing and stepping with poor Rt foot placement. Pt completed 2 rounds of 30 sec seated marching to facilitate BP recovery, effective and pt then stood for attempt at gait however symptomatic with drop in BP and returned to sitting. Attempted 2x30 sec marching again but BP did not recover further. LE's elevated, head upright, Alarm on and call bell within reach. Will continue to progress pt as able during acute stay. Patient will benefit from continued inpatient follow up therapy, <3 hours/day.   Orthostatic VS for the past 24 hrs:  BP- Lying Pulse- Lying BP- Sitting Pulse- Sitting BP- Standing at 0 minutes Pulse- Standing at 0 minutes  06/26/23 1531 -- -- 109/61 74 -- --  06/26/23 1522 -- -- 142/53 71 107/62 (sitting post standing and attempted gait) 75  06/26/23 1517 -- --  99/58 (post transfer to chair) 73 -- --  06/26/23 1515 -- -- 128/63 (with abd binder) 68 -- --  06/26/23 1508 156/67 64 108/57 (no abd binder) 67 -- --        If plan is discharge home, recommend the following: Two people to help with walking and/or transfers;A lot of help with bathing/dressing/bathroom;Assistance with cooking/housework;Assist for transportation;Help with stairs or ramp for entrance   Can travel by private vehicle     No  Equipment Recommendations   (TBD - potential hospital bed, smaller w/c size)    Recommendations for Other Services       Precautions / Restrictions Precautions Precautions: Fall Recall of Precautions/Restrictions: Intact Restrictions Weight Bearing Restrictions Per Provider Order: Yes RLE Weight Bearing Per Provider Order: Weight bearing as tolerated     Mobility  Bed Mobility Overal bed mobility: Needs Assistance Bed Mobility: Supine to Sit Rolling: Min assist, Used rails   Supine to sit: Mod assist, HOB elevated, Used rails     General bed mobility comments: rolling at start to transition off of bed pan. Total assist for pericare. mod assist to fully sit upright to EOB and cues for anterior trunk lean and to scoot for feet on floor.    Transfers Overall transfer level: Needs assistance Equipment used: Rolling walker (2 wheels) Transfers: Bed to chair/wheelchair/BSC, Sit to/from Stand Sit to Stand: Mod assist, +2 physical assistance, +2 safety/equipment   Step pivot transfers: Mod assist, +2 safety/equipment       General transfer comment: BP dropped in sitting but pt  asymptomatic. Recovered with time at EOB. Mod +2 for sit>stand and pt with strong posterior lean and cues to shift weight into toes. Mod+2 to guide walker pivot bed>chair. BP dropped further but stabilize and recovered with seated exercise. 2x sit<>stand to RW with mod +2 from recliner.    Ambulation/Gait               General Gait Details: attempted small  steps forward from recliner, pt symptomatic with standing and no full steps to advance forward completed though pt did shift Rt/Lt and lift feet from floor.   Stairs             Wheelchair Mobility     Tilt Bed    Modified Rankin (Stroke Patients Only)       Balance Overall balance assessment: Needs assistance Sitting-balance support: Feet supported, Bilateral upper extremity supported, Single extremity supported Sitting balance-Leahy Scale: Poor Sitting balance - Comments: Reliant ton UE support, min to CGA   Standing balance support: Bilateral upper extremity supported, During functional activity, Reliant on assistive device for balance Standing balance-Leahy Scale: Poor Standing balance comment: Reliant on RW                            Communication Communication Communication: No apparent difficulties  Cognition Arousal: Alert Behavior During Therapy: WFL for tasks assessed/performed   PT - Cognitive impairments: No family/caregiver present to determine baseline                       PT - Cognition Comments: cognition appears to be improving, pt may take slightly increased time to respond, but overall timely and appropriate, followed cues well. Following commands: Intact      Cueing Cueing Techniques: Verbal cues  Exercises  2 rounds of 2x 30 seconds max effort seated marching following bed>chair tx and following attempted gait following sit<>stan from recliner.    General Comments        Pertinent Vitals/Pain Pain Assessment Pain Assessment: Faces Faces Pain Scale: Hurts a little bit Pain Location: R ankle Pain Descriptors / Indicators: Sore Pain Intervention(s): Limited activity within patient's tolerance, Monitored during session, Repositioned, Premedicated before session    Home Living                          Prior Function            PT Goals (current goals can now be found in the care plan section) Acute  Rehab PT Goals Patient Stated Goal: return home, decreased pain PT Goal Formulation: With patient Time For Goal Achievement: 07/06/23 Potential to Achieve Goals: Good Progress towards PT goals: Progressing toward goals    Frequency    Min 2X/week      PT Plan      Co-evaluation              AM-PAC PT "6 Clicks" Mobility   Outcome Measure  Help needed turning from your back to your side while in a flat bed without using bedrails?: A Little Help needed moving from lying on your back to sitting on the side of a flat bed without using bedrails?: A Lot Help needed moving to and from a bed to a chair (including a wheelchair)?: A Lot Help needed standing up from a chair using your arms (e.g., wheelchair or bedside chair)?: Total Help needed to walk in hospital room?: Total  Help needed climbing 3-5 steps with a railing? : Total 6 Click Score: 10    End of Session Equipment Utilized During Treatment: Gait belt Activity Tolerance: Patient tolerated treatment well;Patient limited by fatigue;Other (comment) Patient left: in chair;with call bell/phone within reach;Other (comment) (BPs) Nurse Communication: Mobility status PT Visit Diagnosis: Other abnormalities of gait and mobility (R26.89);Pain Pain - Right/Left: Right     Time: 1610-9604 PT Time Calculation (min) (ACUTE ONLY): 33 min  Charges:    $Therapeutic Activity: 23-37 mins PT General Charges $$ ACUTE PT VISIT: 1 Visit                     Tish Forge, DPT Acute Rehabilitation Services Office [phone]  06/26/23 4:24 PM

## 2023-06-27 DIAGNOSIS — T847XXD Infection and inflammatory reaction due to other internal orthopedic prosthetic devices, implants and grafts, subsequent encounter: Secondary | ICD-10-CM | POA: Diagnosis not present

## 2023-06-27 LAB — GLUCOSE, CAPILLARY
Glucose-Capillary: 116 mg/dL — ABNORMAL HIGH (ref 70–99)
Glucose-Capillary: 124 mg/dL — ABNORMAL HIGH (ref 70–99)
Glucose-Capillary: 98 mg/dL (ref 70–99)
Glucose-Capillary: 135 mg/dL — ABNORMAL HIGH (ref 70–99)

## 2023-06-27 LAB — CK: Total CK: 39 U/L — ABNORMAL LOW (ref 49–397)

## 2023-06-27 NOTE — TOC Progression Note (Signed)
 Transition of Care [facility]) - Progression Note    Patient Details  Name: Harold [name] MRN: [id number] Date of Birth: 1955-07-22  Transition of Care [facility]) CM/SW Contact  Katrinka Parr, [place] Phone Number: 06/27/2023, 11:50 AM  Clinical Narrative:    CSW spoke with pt's spouse who confirmed they would like to move forward with [facility] for rehab. CSW explained SNF auth process.    SNF auth request submitted in online portal; [id number] [name] is pending. TOC will continue to follow.   Expected Discharge Plan: Skilled Nursing Facility Barriers to Discharge: Insurance Authorization    Social Determinants of Health (SDOH) Interventions SDOH Screenings   Food Insecurity: No Food Insecurity (06/20/2023)  Housing: Low Risk  (06/20/2023)  Transportation Needs: No Transportation Needs (06/20/2023)  Utilities: Not At Risk (06/20/2023)  Social Connections: Moderately Integrated (06/20/2023)  Tobacco Use: Medium Risk (06/21/2023)    Readmission Risk Interventions     No data to display

## 2023-06-27 NOTE — Progress Notes (Signed)
 Pt orthostatic vitals recorded during session. See OT treatment note for further details.  06/27/23 1500  Orthostatic Lying   BP- Lying 172/69  Pulse- Lying 64  Orthostatic Sitting  BP- Sitting 127/64  Pulse- Sitting 82  Orthostatic Standing at 0 minutes  BP- Standing at 0 minutes (!) 79/52  Pulse- Standing at 0 minutes 83   Delmer Ferraris, OT  Acute Rehabilitation Services Office [phone] Secure chat preferred

## 2023-06-27 NOTE — Progress Notes (Signed)
   06/27/23 1959  BiPAP/CPAP/SIPAP  BiPAP/CPAP/SIPAP Pt Type Adult  Reason BIPAP/CPAP not in use Non-compliant  BiPAP/CPAP /SiPAP Vitals  Pulse Rate 66  Resp 18  SpO2 100 %  Bilateral Breath Sounds Clear;Diminished  MEWS Score/Color  MEWS Score 0  MEWS Score Color Marrie Sizer

## 2023-06-27 NOTE — Progress Notes (Signed)
 PROGRESS NOTE  Harold [name] UJW:[id number] DOB: 08-12-1955   PCP: Amin, Saad, MD  Patient is from: Home.  Lives with significant other.  DOA: 06/19/2023 LOS: 8  Chief complaints Chief Complaint  Patient presents with   Post-op Problem     Brief Narrative / Interim history: As per prior documentation: "68 year old M with PMH of CAD/PCI, CKD-4, COPD, PAH, OSA, DM with neuropathy, chronic venous insufficiency, orthostatic hypotension, HLD and diabetic foot ulcer, traumatic right ankle fracture in 12/2022 requiring hardware placement complicated by need for multiple surgeries and infection with Enterobacter cloacae bacteremia and recent right ankle arthrodesis on 3/27 presenting with pain, bleeding and exposed hardware.  No report of fever or constitutional symptoms.  He was on ciprofloxacin  outpatient.  In ED, stable vitals.  No leukocytosis.  CRP 5.0. Cr 2.18 (above baseline).  Hgb 8.8 (above baseline).  Right ankle x-ray showed postoperative changes with surgical hardware unchanged from prior, mildly displaced fracture deformity of the posterior tibial malleolus, degenerative changes in the ankle joint and first MPJ and soft tissue swelling over the dorsum of the foot.  Blood cultures obtained.  Started on broad-spectrum antibiotics.  Orthopedic surgery consulted   The next day, blood cultures NGTD.  Hgb dropped to 7.3.  Transfused 1 unit with appropriate response.  Underwent hardware removal of right foot on 5/3.    ID recommended PICC line and 6 weeks of ertapenem  and daptomycin .  PICC line placed on 5/7.  Medically stable for discharge pending SNF bed".   06/27/2023: Patient seen.  Patient remains significantly orthostatic.  Apparently, patient has chronic orthostatic hypotension.  Consider support stockings and abdominal binders.  Awaiting disposition.  Subjective: No new complaints  Objective: Vitals:   06/26/23 1449 06/26/23 2232 06/27/23 0747 06/27/23 1422  BP: (!) 155/76  (!) 160/67 (!) 164/59 (!) 161/72  Pulse: 69 66 66 65  Resp: 18 16 18 18   Temp: 98.1 F (36.7 C) (!) 97.4 F (36.3 C) (!) 97.4 F (36.3 C) (!) 97.5 F (36.4 C)  TempSrc:  Oral    SpO2: 97% 98% 100% 97%  Weight:      Height:        Examination:  GENERAL: No apparent distress.  Patient is awake and alert.  Patient is obese. HEENT: Patient is pale.  No jaundice. Neuro: Awake and alert. NECK: Supple.    RESP: Clear to auscultation.. CVS: S1-S2. ABD/GI/GU: Obese, soft and nontender. MSK/EXT: Mild left lower extremity edema.  Consultants:  Orthopedic surgery  Procedures: 5/3-right foot/ankle hardware removal by Dr. Curtiss Dowdy  Microbiology summarized: Blood cultures NGTD  Assessment and plan: Right ankle hardware infection: Patient with traumatic right ankle fracture in 12/2022 requiring hardware placement complicated by need for multiple surgeries and infection with Enterobacter cloacae bacteremia and recent right ankle arthrodesis on 3/27.  Seems  to be on Cipro  outpatient. -S/p right ankle hardware removal by Dr. Curtiss Dowdy on 5/3 -ID recommended PICC line and 6 weeks of antibiotics with ertapenem  and daptomycin . -PICC line placed on 5/7. -Pain control per Ortho-would minimize opiate given orthostatic hypotension -On home Plavix  and Eliquis  which would serve VTE prophylaxis -WBAT on RLE per Ortho -PT/OT-recommended SNF.  IDDM-2 with hyperglycemia, neuropathy and diabetic ulcer: A1c 7.0% in 12/2022 Recent Labs  Lab 06/26/23 1113 06/26/23 1636 06/26/23 2214 06/27/23 0614 06/27/23 1159  GLUCAP 138* 134* 119* 116* 124*  -Continue Semglee  15 units at night -Continue SSI-moderate -Continue home Cymbalta , gabapentin  and Lipitor -Repeat A1c not reliable after transfusion.  AKI on CKD-3A: thought to have CKD 4 but renal function improved tremendously suggesting CKD-3A Recent Labs    05/19/23 0759 05/20/23 0614 05/21/23 0504 05/26/23 1420 06/19/23 2023 06/20/23 0114  06/20/23 0115 06/21/23 6295 06/22/23 0546 06/23/23 0959 06/25/23 0530  BUN 27* 26* 26* 33* 17  --  18 15 16 14 14   CREATININE 2.76* 2.62* 2.38* 2.47* 2.18* 2.12* 2.14* 2.05* 2.14* 1.90* 1.56*  -Continue monitoring - Avoid or minimize nephrotoxic meds 06/27/2023: AKI has resolved significantly.  Paroxysmal A-fib: Rate controlled -Continue amiodarone  and Eliquis    PAH/OSA - CPAP at night - Minimize sedating medications   Orthostatic hypotension: Chronic.  Resting SBP elevated but drops to 80s with sitting up -Elevate head of bed to > 30 degrees -TED hose to LLE and abdominal binder.  Discussed with RN. -OOB/PT/OT/fall precaution 06/27/2023: Patient remains significantly orthostatic.  Support stockings.  Abdominal binders.  Orthostatic hypotension is chronic.     Anemia of renal disease: Hgb dropped to 7.3 on 5/2.  Transfused 1 units with appropriate response.  Baseline Hgb about 8.0.  No nutritional deficiency on anemia panel Recent Labs    05/19/23 0759 05/20/23 0614 05/26/23 1420 05/26/23 1427 06/19/23 2023 06/20/23 0114 06/21/23 2841 06/22/23 0546 06/23/23 0654 06/25/23 0530  HGB 7.5* 8.6* 10.0* 9.5* 8.8* 7.3* 8.7* 8.2* 9.0* 9.1*  -Continue monitoring 06/27/2023: Stable H/H.   PAD: On Plavix  and Lipitor - Continue home Lipitor and Plavix    Hypothyroidism:  -Continue levothyroxine   BPH - Continue home Flomax   GERD - Continue PPI  History of gout -Continue allopurinol   Class II obesity Body mass index is 36.22 kg/m.  Pressure skin injury: Present on arrival Pressure Injury 06/20/23 Sacrum Right Unstageable - Full thickness tissue loss in which the base of the injury is covered by slough (yellow, tan, gray, green or brown) and/or eschar (tan, brown or black) in the wound bed. (Active)  06/20/23 0050  Location: Sacrum  Location Orientation: Right  Staging: Unstageable - Full thickness tissue loss in which the base of the injury is covered by slough (yellow,  tan, gray, green or brown) and/or eschar (tan, brown or black) in the wound bed.  Wound Description (Comments):   Present on Admission: Yes  Dressing Type Foam - Lift dressing to assess site every shift 06/20/23 0358   DVT prophylaxis:  Place TED hose Start: 06/25/23 0957 SCDs Start: 06/21/23 0914 apixaban  (ELIQUIS ) tablet 5 mg  Code Status: Full code Family Communication: None at bedside today. Level of care: Med-Surg Status is: Inpatient Remains inpatient appropriate because: SNF bed and insurance authorization   Final disposition: SNF   35 minutes with more than 50% spent in reviewing records, counseling patient/family and coordinating care.   Sch Meds:  Scheduled Meds:  allopurinol   100 mg Oral Daily   amiodarone   200 mg Oral BID   apixaban   5 mg Oral BID   atorvastatin   40 mg Oral QHS   Chlorhexidine  Gluconate Cloth  6 each Topical Daily   cholecalciferol   2,000 Units Oral Daily   clopidogrel   75 mg Oral Daily   docusate sodium   100 mg Oral BID   DULoxetine   60 mg Oral BID   ezetimibe   10 mg Oral Daily   ferrous sulfate   325 mg Oral Once per day on Monday Wednesday Friday   gabapentin   300 mg Oral QHS   Gerhardt's butt cream   Topical BID   insulin  aspart  0-15 Units Subcutaneous TID WC   insulin  aspart  0-5 Units Subcutaneous QHS   insulin  glargine-yfgn  15 Units Subcutaneous QHS   leptospermum manuka honey  1 Application Topical Daily   levothyroxine   88 mcg Oral QAC breakfast   loratadine   10 mg Oral Daily   magnesium  oxide  400 mg Oral Daily   multivitamin with minerals  1 tablet Oral Daily   nutrition supplement (JUVEN)  1 packet Oral BID BM   pantoprazole   40 mg Oral Daily   sodium chloride  flush  10-40 mL Intracatheter Q12H   tamsulosin   0.4 mg Oral Daily   Continuous Infusions:  DAPTOmycin  800 mg (06/27/23 1441)   ertapenem  1 g (06/27/23 0922)   PRN Meds:.acetaminophen  **OR** acetaminophen , HYDROmorphone  (DILAUDID ) injection, methocarbamol ,  metoCLOPramide  **OR** metoCLOPramide  (REGLAN ) injection, nitroGLYCERIN , oxyCODONE , polyethylene glycol, sodium chloride  flush  Antimicrobials: Anti-infectives (From admission, onward)    Start     Dose/Rate Route Frequency Ordered Stop   06/23/23 1030  ertapenem  (INVANZ ) 1 g in sodium chloride  0.9 % 100 mL IVPB        1 g 200 mL/hr over 30 Minutes Intravenous Every 24 hours 06/23/23 0928     06/21/23 2200  DAPTOmycin  (CUBICIN ) 800 mg in sodium chloride  0.9 % IVPB        8 mg/kg  97.7 kg (Adjusted) 132 mL/hr over 30 Minutes Intravenous Daily 06/20/23 1005     06/21/23 0716  ceFAZolin  (ANCEF ) 2-4 GM/100ML-% IVPB       Note to Pharmacy: Darrell Else D: cabinet override      06/21/23 0716 06/21/23 0757   06/21/23 0715  ceFAZolin  (ANCEF ) IVPB 2g/100 mL premix        2 g 200 mL/hr over 30 Minutes Intravenous  Once 06/21/23 0706 06/21/23 0753   06/20/23 0045  ciprofloxacin  (CIPRO ) tablet 500 mg  Status:  Discontinued        500 mg Oral 2 times daily 06/19/23 2358 06/20/23 1001   06/19/23 2316  vancomycin  (VANCOREADY) IVPB 2000 mg/400 mL  Status:  Discontinued        2,000 mg 200 mL/hr over 120 Minutes Intravenous Every 48 hours 06/19/23 2316 06/20/23 1005   06/19/23 2315  ceFEPIme  (MAXIPIME ) 2 g in sodium chloride  0.9 % 100 mL IVPB  Status:  Discontinued        2 g 200 mL/hr over 30 Minutes Intravenous Every 12 hours 06/19/23 2311 06/23/23 0928   06/19/23 2245  cefTRIAXone  (ROCEPHIN ) 2 g in sodium chloride  0.9 % 100 mL IVPB  Status:  Discontinued       Placed in "And" Linked Group   2 g 200 mL/hr over 30 Minutes Intravenous Once 06/19/23 2241 06/19/23 2311   06/19/23 2245  metroNIDAZOLE  (FLAGYL ) IVPB 500 mg       Placed in "And" Linked Group   500 mg 100 mL/hr over 60 Minutes Intravenous  Once 06/19/23 2241 06/20/23 0014   06/19/23 2245  vancomycin  (VANCOCIN ) IVPB 1000 mg/200 mL premix  Status:  Discontinued       Placed in "And" Linked Group   1,000 mg 200 mL/hr over 60 Minutes  Intravenous  Once 06/19/23 2241 06/19/23 2243   06/19/23 2245  vancomycin  (VANCOREADY) IVPB 2000 mg/400 mL  Status:  Discontinued        2,000 mg 200 mL/hr over 120 Minutes Intravenous  Once 06/19/23 2244 06/19/23 2316        I have personally reviewed the following labs and images: CBC: Recent Labs  Lab 06/21/23 1610 06/22/23 0546 06/23/23 0654  06/25/23 0530  WBC 4.8 6.7 7.2 5.9  HGB 8.7* 8.2* 9.0* 9.1*  HCT 26.7* 25.3* 27.8* 27.5*  MCV 88.4 89.4 90.3 86.5  PLT 135* 132* 126* 134*   BMP &GFR Recent Labs  Lab 06/21/23 0633 06/22/23 0546 06/23/23 0959 06/25/23 0530  NA 137 135 138 134*  K 3.8 3.8 4.0 3.5  CL 108 106 106 102  CO2 22 22 26 22   GLUCOSE 133* 204* 122* 146*  BUN 15 16 14 14   CREATININE 2.05* 2.14* 1.90* 1.56*  CALCIUM  8.2* 8.2* 8.6* 8.6*  MG 1.7 1.8  --  1.9  PHOS 3.1  --  2.8 2.5   Estimated Creatinine Clearance: 61.8 mL/min (A) (by C-G formula based on SCr of 1.56 mg/dL (H)). Liver & Pancreas: Recent Labs  Lab 06/21/23 1610 06/23/23 0959 06/25/23 0530  ALBUMIN  2.0* 1.8* 1.7*   No results for input(s): "LIPASE", "AMYLASE" in the last 168 hours. No results for input(s): "AMMONIA" in the last 168 hours. Diabetic: No results for input(s): "HGBA1C" in the last 72 hours. Recent Labs  Lab 06/26/23 1113 06/26/23 1636 06/26/23 2214 06/27/23 0614 06/27/23 1159  GLUCAP 138* 134* 119* 116* 124*   Cardiac Enzymes: Recent Labs  Lab 06/21/23 0633 06/27/23 0416  CKTOTAL 36* 39*   No results for input(s): "PROBNP" in the last 8760 hours. Coagulation Profile: No results for input(s): "INR", "PROTIME" in the last 168 hours. Thyroid  Function Tests: No results for input(s): "TSH", "T4TOTAL", "FREET4", "T3FREE", "THYROIDAB" in the last 72 hours. Lipid Profile: No results for input(s): "CHOL", "HDL", "LDLCALC", "TRIG", "CHOLHDL", "LDLDIRECT" in the last 72 hours. Anemia Panel: No results for input(s): "VITAMINB12", "FOLATE", "FERRITIN", "TIBC",  "IRON", "RETICCTPCT" in the last 72 hours.  Urine analysis:    Component Value Date/Time   COLORURINE YELLOW 04/15/2023 1732   APPEARANCEUR CLOUDY (A) 04/15/2023 1732   LABSPEC 1.014 04/15/2023 1732   PHURINE 5.0 04/15/2023 1732   GLUCOSEU >=500 (A) 04/15/2023 1732   HGBUR MODERATE (A) 04/15/2023 1732   BILIRUBINUR NEGATIVE 04/15/2023 1732   BILIRUBINUR negative 05/12/2018 1213   BILIRUBINUR small 06/29/2014 0859   KETONESUR NEGATIVE 04/15/2023 1732   PROTEINUR >=300 (A) 04/15/2023 1732   UROBILINOGEN 0.2 05/12/2018 1213   UROBILINOGEN 1.0 05/02/2014 2015   NITRITE NEGATIVE 04/15/2023 1732   LEUKOCYTESUR NEGATIVE 04/15/2023 1732   Sepsis Labs: Invalid input(s): "PROCALCITONIN", "LACTICIDVEN"  Microbiology: Recent Results (from the past 240 hours)  Culture, blood (routine x 2)     Status: None   Collection Time: 06/19/23  8:23 PM   Specimen: BLOOD  Result Value Ref Range Status   Specimen Description BLOOD LEFT ANTECUBITAL  Final   Special Requests   Final    BOTTLES DRAWN AEROBIC AND ANAEROBIC Blood Culture results may not be optimal due to an inadequate volume of blood received in culture bottles   Culture   Final    NO GROWTH 5 DAYS Performed at [facility], [address], [place], [place] [address]    Report Status 06/24/2023 FINAL  Final  Culture, blood (routine x 2)     Status: None   Collection Time: 06/20/23  1:14 AM   Specimen: BLOOD LEFT HAND  Result Value Ref Range Status   Specimen Description BLOOD LEFT HAND  Final   Special Requests   Final    BOTTLES DRAWN AEROBIC AND ANAEROBIC Blood Culture adequate volume   Culture   Final    NO GROWTH 5 DAYS Performed at [facility]ospital Lab, [address], [place], [place] [address]    Report Status 06/25/2023 FINAL  Final  Surgical pcr screen     Status: None   Collection Time: 06/21/23  5:53 AM   Specimen: Nasal Mucosa; Nasal Swab  Result Value Ref Range Status   MRSA, PCR NEGATIVE NEGATIVE Final    Staphylococcus aureus NEGATIVE NEGATIVE Final    Comment: (NOTE) The Xpert SA Assay (FDA approved for NASAL specimens in patients 72 years of age and older), is one component of a comprehensive surveillance program. It is not intended to diagnose infection nor to guide or monitor treatment. Performed at [facility], [address], [place], [place] [address]     Radiology Studies: No results found.      Fonnie Iba, MD. [facility]  If 7PM-7AM, please contact night-coverage [email or web address] 06/27/2023, 3:00 PM

## 2023-06-27 NOTE — Progress Notes (Addendum)
 Occupational Therapy Treatment Patient Details Name: Harold [name] MRN: [id number] DOB: Jan 26, 1956 Today's Date: 06/27/2023   History of present illness 68 y.o. male admitted 06/19/23 with R heel wound and exposed hardware, also with hypotension and seizure-like activity at home per [facility]. S/p hardware removal R foot on 5/3. Of note, h/o R ankle fx s/p R ankle/subtalar arthrodesis 05/15/23. Other PMH includes HTN, HLD, CAD, COPD, DM2, CKD, PVD, gout, afib, obesity, neuropathy.   OT comments  Pt progressing well towards goals. Pt progressed bed mobility to min HH assist to come to supine. With bed mobility pt experiencing orthostatic symptoms, despite donning abdominal binder. Pt encouraged to complete ankle pumps and leg lifts to decrease symptoms. SPT completed with mod +2 assist. While maintaining standing pt began experiencing symptoms again, and required seated break. Vitals recorded, MD and RN notified. Symptoms continue to limit pt's progress. Continue to recommend <3 hours of skilled rehab daily to optimize independence levels. Will continue to follow acutely.       If plan is discharge home, recommend the following:  Two people to help with walking and/or transfers;A lot of help with bathing/dressing/bathroom;Assistance with cooking/housework;Assist for transportation;Help with stairs or ramp for entrance   Equipment Recommendations  Other (comment) (Defer to next venue)    Recommendations for Other Services      Precautions / Restrictions Precautions Precautions: Fall Recall of Precautions/Restrictions: Intact Restrictions Weight Bearing Restrictions Per Provider Order: Yes RLE Weight Bearing Per Provider Order: Weight bearing as tolerated       Mobility Bed Mobility Overal bed mobility: Needs Assistance Bed Mobility: Supine to Sit     Supine to sit: Min assist, HOB elevated, Used rails     General bed mobility comments: Min HH assist    Transfers Overall  transfer level: Needs assistance Equipment used: Rolling walker (2 wheels) Transfers: Bed to chair/wheelchair/BSC, Sit to/from Stand Sit to Stand: Mod assist, +2 physical assistance, +2 safety/equipment Stand pivot transfers: Mod assist, +2 physical assistance, +2 safety/equipment         General transfer comment: Pt with heavy posterior lean with standing d/t orthostatic symptoms     Balance Overall balance assessment: Needs assistance Sitting-balance support: Feet supported, Bilateral upper extremity supported, Single extremity supported Sitting balance-Leahy Scale: Poor Sitting balance - Comments: reliant on UE Postural control: Posterior lean Standing balance support: Bilateral upper extremity supported, During functional activity, Reliant on assistive device for balance Standing balance-Leahy Scale: Poor Standing balance comment: Reliant on RW       ADL either performed or assessed with clinical judgement   ADL Overall ADL's : Needs assistance/impaired       Toilet Transfer: Moderate assistance;+2 for physical assistance;+2 for safety/equipment;Stand-pivot;Rolling walker (2 wheels) Toilet Transfer Details (indicate cue type and reason): Simulated in room Toileting- Clothing Manipulation and Hygiene: Total assistance;+2 for physical assistance;+2 for safety/equipment;Sit to/from stand Toileting - Clothing Manipulation Details (indicate cue type and reason): Simulated in room     Functional mobility during ADLs: Moderate assistance;+2 for physical assistance;+2 for safety/equipment;Rolling walker (2 wheels)      Extremity/Trunk Assessment Upper Extremity Assessment Upper Extremity Assessment: Overall WFL for tasks assessed   Lower Extremity Assessment Lower Extremity Assessment: Defer to PT evaluation        Vision   Vision Assessment?: No apparent visual deficits         Communication Communication Communication: No apparent difficulties   Cognition  Arousal: Alert Behavior During Therapy: WFL for tasks assessed/performed Cognition: No apparent impairments  Following commands: Intact        Cueing   Cueing Techniques: Verbal cues        General Comments C/o of orthostatic symptoms vitals recorded in flow sheets    Pertinent Vitals/ Pain       Pain Assessment Pain Assessment: Faces Faces Pain Scale: Hurts a little bit Pain Location: R ankle Pain Descriptors / Indicators: Sore Pain Intervention(s): Limited activity within patient's tolerance   Frequency  Min 2X/week        Progress Toward Goals  OT Goals(current goals can now be found in the care plan section)  Progress towards OT goals: Progressing toward goals  Acute Rehab OT Goals Patient Stated Goal: To get better OT Goal Formulation: With patient Time For Goal Achievement: 07/05/23 Potential to Achieve Goals: Good ADL Goals Pt Will Perform Upper Body Dressing: with set-up;sitting Pt Will Transfer to Toilet: with min assist;stand pivot transfer;bedside commode Additional ADL Goal #1: Pt will be able to perform supine to sit and maintain sitting balance at EOB with min A to maximize independence and reduce caregiver burden.  Plan         AM-PAC OT "6 Clicks" Daily Activity     Outcome Measure   Help from another person eating meals?: None Help from another person taking care of personal grooming?: A Little Help from another person toileting, which includes using toliet, bedpan, or urinal?: Total Help from another person bathing (including washing, rinsing, drying)?: A Lot Help from another person to put on and taking off regular upper body clothing?: A Little Help from another person to put on and taking off regular lower body clothing?: Total 6 Click Score: 14    End of Session Equipment Utilized During Treatment: Gait belt;Rolling walker (2 wheels)  OT Visit Diagnosis: Unsteadiness on feet (R26.81);Other abnormalities of gait and mobility  (R26.89);Muscle weakness (generalized) (M62.81);Repeated falls (R29.6);History of falling (Z91.81);Pain Pain - Right/Left: Right Pain - part of body: Ankle and joints of foot   Activity Tolerance Patient limited by fatigue   Patient Left in chair;with call bell/phone within reach;with chair alarm set   Nurse Communication Mobility status        Time: 1511-1536 OT Time Calculation (min): 25 min  Charges: OT General Charges $OT Visit: 1 Visit OT Treatments $Self Care/Home Management : 23-37 mins  Delmer Ferraris, OT  Acute Rehabilitation Services Office [phone] Secure chat preferred   Mickael Alamo 06/27/2023, 4:05 PM

## 2023-06-27 NOTE — Progress Notes (Signed)
 OT Cancellation Note  Patient Details Name: Harold [name] MRN: [id number] DOB: [birth year]   Cancelled Treatment:    Reason Eval/Treat Not Completed: Patient declined, no reason specified. Pt in bed difficult to arouse. Reporting he would like OT to return. Agreeable to return at 3. Will follow up as able to.  Wister Hoefle C, OT  Acute Rehabilitation Services Office [phone] Secure chat preferred   Mickael Alamo 06/27/2023, 2:05 PM

## 2023-06-28 ENCOUNTER — Other Ambulatory Visit: Payer: Self-pay | Admitting: Internal Medicine

## 2023-06-28 DIAGNOSIS — F419 Anxiety disorder, unspecified: Secondary | ICD-10-CM | POA: Diagnosis present

## 2023-06-28 DIAGNOSIS — Z452 Encounter for adjustment and management of vascular access device: Secondary | ICD-10-CM | POA: Diagnosis not present

## 2023-06-28 DIAGNOSIS — I872 Venous insufficiency (chronic) (peripheral): Secondary | ICD-10-CM | POA: Diagnosis not present

## 2023-06-28 DIAGNOSIS — E1122 Type 2 diabetes mellitus with diabetic chronic kidney disease: Secondary | ICD-10-CM | POA: Diagnosis present

## 2023-06-28 DIAGNOSIS — E43 Unspecified severe protein-calorie malnutrition: Secondary | ICD-10-CM | POA: Diagnosis not present

## 2023-06-28 DIAGNOSIS — G8929 Other chronic pain: Secondary | ICD-10-CM | POA: Diagnosis not present

## 2023-06-28 DIAGNOSIS — R58 Hemorrhage, not elsewhere classified: Secondary | ICD-10-CM | POA: Diagnosis not present

## 2023-06-28 DIAGNOSIS — Z743 Need for continuous supervision: Secondary | ICD-10-CM | POA: Diagnosis not present

## 2023-06-28 DIAGNOSIS — D631 Anemia in chronic kidney disease: Secondary | ICD-10-CM | POA: Diagnosis not present

## 2023-06-28 DIAGNOSIS — R404 Transient alteration of awareness: Secondary | ICD-10-CM | POA: Diagnosis not present

## 2023-06-28 DIAGNOSIS — S0012XA Contusion of left eyelid and periocular area, initial encounter: Secondary | ICD-10-CM | POA: Diagnosis not present

## 2023-06-28 DIAGNOSIS — N189 Chronic kidney disease, unspecified: Secondary | ICD-10-CM | POA: Diagnosis not present

## 2023-06-28 DIAGNOSIS — T84624D Infection and inflammatory reaction due to internal fixation device of right fibula, subsequent encounter: Secondary | ICD-10-CM | POA: Diagnosis not present

## 2023-06-28 DIAGNOSIS — M86671 Other chronic osteomyelitis, right ankle and foot: Secondary | ICD-10-CM | POA: Diagnosis not present

## 2023-06-28 DIAGNOSIS — Z794 Long term (current) use of insulin: Secondary | ICD-10-CM | POA: Diagnosis not present

## 2023-06-28 DIAGNOSIS — Y831 Surgical operation with implant of artificial internal device as the cause of abnormal reaction of the patient, or of later complication, without mention of misadventure at the time of the procedure: Secondary | ICD-10-CM | POA: Diagnosis present

## 2023-06-28 DIAGNOSIS — Z79899 Other long term (current) drug therapy: Secondary | ICD-10-CM | POA: Diagnosis not present

## 2023-06-28 DIAGNOSIS — E8809 Other disorders of plasma-protein metabolism, not elsewhere classified: Secondary | ICD-10-CM | POA: Diagnosis not present

## 2023-06-28 DIAGNOSIS — Z5181 Encounter for therapeutic drug level monitoring: Secondary | ICD-10-CM | POA: Diagnosis not present

## 2023-06-28 DIAGNOSIS — M48062 Spinal stenosis, lumbar region with neurogenic claudication: Secondary | ICD-10-CM | POA: Diagnosis not present

## 2023-06-28 DIAGNOSIS — I10 Essential (primary) hypertension: Secondary | ICD-10-CM | POA: Diagnosis not present

## 2023-06-28 DIAGNOSIS — I959 Hypotension, unspecified: Secondary | ICD-10-CM | POA: Diagnosis not present

## 2023-06-28 DIAGNOSIS — E039 Hypothyroidism, unspecified: Secondary | ICD-10-CM | POA: Diagnosis present

## 2023-06-28 DIAGNOSIS — L97314 Non-pressure chronic ulcer of right ankle with necrosis of bone: Secondary | ICD-10-CM | POA: Diagnosis not present

## 2023-06-28 DIAGNOSIS — M6281 Muscle weakness (generalized): Secondary | ICD-10-CM | POA: Diagnosis not present

## 2023-06-28 DIAGNOSIS — I739 Peripheral vascular disease, unspecified: Secondary | ICD-10-CM | POA: Diagnosis not present

## 2023-06-28 DIAGNOSIS — Z7901 Long term (current) use of anticoagulants: Secondary | ICD-10-CM | POA: Diagnosis not present

## 2023-06-28 DIAGNOSIS — F331 Major depressive disorder, recurrent, moderate: Secondary | ICD-10-CM | POA: Diagnosis present

## 2023-06-28 DIAGNOSIS — E876 Hypokalemia: Secondary | ICD-10-CM | POA: Diagnosis present

## 2023-06-28 DIAGNOSIS — L89150 Pressure ulcer of sacral region, unstageable: Secondary | ICD-10-CM | POA: Diagnosis present

## 2023-06-28 DIAGNOSIS — L97509 Non-pressure chronic ulcer of other part of unspecified foot with unspecified severity: Secondary | ICD-10-CM | POA: Diagnosis not present

## 2023-06-28 DIAGNOSIS — Z7902 Long term (current) use of antithrombotics/antiplatelets: Secondary | ICD-10-CM | POA: Diagnosis not present

## 2023-06-28 DIAGNOSIS — E1143 Type 2 diabetes mellitus with diabetic autonomic (poly)neuropathy: Secondary | ICD-10-CM | POA: Diagnosis present

## 2023-06-28 DIAGNOSIS — Z7401 Bed confinement status: Secondary | ICD-10-CM | POA: Diagnosis not present

## 2023-06-28 DIAGNOSIS — I4819 Other persistent atrial fibrillation: Secondary | ICD-10-CM | POA: Diagnosis not present

## 2023-06-28 DIAGNOSIS — Z87891 Personal history of nicotine dependence: Secondary | ICD-10-CM | POA: Diagnosis not present

## 2023-06-28 DIAGNOSIS — E1169 Type 2 diabetes mellitus with other specified complication: Secondary | ICD-10-CM | POA: Diagnosis present

## 2023-06-28 DIAGNOSIS — R42 Dizziness and giddiness: Secondary | ICD-10-CM | POA: Diagnosis not present

## 2023-06-28 DIAGNOSIS — T8469XA Infection and inflammatory reaction due to internal fixation device of other site, initial encounter: Secondary | ICD-10-CM | POA: Diagnosis present

## 2023-06-28 DIAGNOSIS — Z4889 Encounter for other specified surgical aftercare: Secondary | ICD-10-CM | POA: Diagnosis not present

## 2023-06-28 DIAGNOSIS — E11621 Type 2 diabetes mellitus with foot ulcer: Secondary | ICD-10-CM | POA: Diagnosis not present

## 2023-06-28 DIAGNOSIS — K068 Other specified disorders of gingiva and edentulous alveolar ridge: Secondary | ICD-10-CM | POA: Diagnosis not present

## 2023-06-28 DIAGNOSIS — R7989 Other specified abnormal findings of blood chemistry: Secondary | ICD-10-CM | POA: Diagnosis present

## 2023-06-28 DIAGNOSIS — I4821 Permanent atrial fibrillation: Secondary | ICD-10-CM | POA: Diagnosis not present

## 2023-06-28 DIAGNOSIS — I272 Pulmonary hypertension, unspecified: Secondary | ICD-10-CM | POA: Diagnosis present

## 2023-06-28 DIAGNOSIS — G4733 Obstructive sleep apnea (adult) (pediatric): Secondary | ICD-10-CM | POA: Diagnosis present

## 2023-06-28 DIAGNOSIS — Z7985 Long-term (current) use of injectable non-insulin antidiabetic drugs: Secondary | ICD-10-CM | POA: Diagnosis not present

## 2023-06-28 DIAGNOSIS — I130 Hypertensive heart and chronic kidney disease with heart failure and stage 1 through stage 4 chronic kidney disease, or unspecified chronic kidney disease: Secondary | ICD-10-CM | POA: Diagnosis not present

## 2023-06-28 DIAGNOSIS — E785 Hyperlipidemia, unspecified: Secondary | ICD-10-CM | POA: Diagnosis not present

## 2023-06-28 DIAGNOSIS — N1831 Chronic kidney disease, stage 3a: Secondary | ICD-10-CM | POA: Diagnosis not present

## 2023-06-28 DIAGNOSIS — R531 Weakness: Secondary | ICD-10-CM | POA: Diagnosis not present

## 2023-06-28 DIAGNOSIS — M86171 Other acute osteomyelitis, right ankle and foot: Secondary | ICD-10-CM | POA: Diagnosis not present

## 2023-06-28 DIAGNOSIS — E1140 Type 2 diabetes mellitus with diabetic neuropathy, unspecified: Secondary | ICD-10-CM | POA: Diagnosis not present

## 2023-06-28 DIAGNOSIS — I270 Primary pulmonary hypertension: Secondary | ICD-10-CM | POA: Diagnosis not present

## 2023-06-28 DIAGNOSIS — E1165 Type 2 diabetes mellitus with hyperglycemia: Secondary | ICD-10-CM | POA: Diagnosis not present

## 2023-06-28 DIAGNOSIS — K219 Gastro-esophageal reflux disease without esophagitis: Secondary | ICD-10-CM | POA: Diagnosis not present

## 2023-06-28 DIAGNOSIS — T847XXD Infection and inflammatory reaction due to other internal orthopedic prosthetic devices, implants and grafts, subsequent encounter: Secondary | ICD-10-CM | POA: Diagnosis not present

## 2023-06-28 DIAGNOSIS — I951 Orthostatic hypotension: Secondary | ICD-10-CM | POA: Diagnosis not present

## 2023-06-28 DIAGNOSIS — S82851E Displaced trimalleolar fracture of right lower leg, subsequent encounter for open fracture type I or II with routine healing: Secondary | ICD-10-CM | POA: Diagnosis not present

## 2023-06-28 DIAGNOSIS — I480 Paroxysmal atrial fibrillation: Secondary | ICD-10-CM | POA: Diagnosis not present

## 2023-06-28 DIAGNOSIS — T847XXA Infection and inflammatory reaction due to other internal orthopedic prosthetic devices, implants and grafts, initial encounter: Secondary | ICD-10-CM | POA: Diagnosis not present

## 2023-06-28 DIAGNOSIS — E038 Other specified hypothyroidism: Secondary | ICD-10-CM | POA: Diagnosis not present

## 2023-06-28 DIAGNOSIS — Z7409 Other reduced mobility: Secondary | ICD-10-CM | POA: Diagnosis not present

## 2023-06-28 DIAGNOSIS — R262 Difficulty in walking, not elsewhere classified: Secondary | ICD-10-CM | POA: Diagnosis not present

## 2023-06-28 DIAGNOSIS — E871 Hypo-osmolality and hyponatremia: Secondary | ICD-10-CM | POA: Diagnosis not present

## 2023-06-28 DIAGNOSIS — I2510 Atherosclerotic heart disease of native coronary artery without angina pectoris: Secondary | ICD-10-CM | POA: Diagnosis not present

## 2023-06-28 DIAGNOSIS — I5032 Chronic diastolic (congestive) heart failure: Secondary | ICD-10-CM | POA: Diagnosis not present

## 2023-06-28 DIAGNOSIS — N184 Chronic kidney disease, stage 4 (severe): Secondary | ICD-10-CM | POA: Diagnosis present

## 2023-06-28 DIAGNOSIS — N400 Enlarged prostate without lower urinary tract symptoms: Secondary | ICD-10-CM | POA: Diagnosis present

## 2023-06-28 DIAGNOSIS — J449 Chronic obstructive pulmonary disease, unspecified: Secondary | ICD-10-CM | POA: Diagnosis not present

## 2023-06-28 DIAGNOSIS — T8140XD Infection following a procedure, unspecified, subsequent encounter: Secondary | ICD-10-CM | POA: Diagnosis not present

## 2023-06-28 DIAGNOSIS — D649 Anemia, unspecified: Secondary | ICD-10-CM | POA: Diagnosis not present

## 2023-06-28 DIAGNOSIS — I129 Hypertensive chronic kidney disease with stage 1 through stage 4 chronic kidney disease, or unspecified chronic kidney disease: Secondary | ICD-10-CM | POA: Diagnosis present

## 2023-06-28 DIAGNOSIS — H1132 Conjunctival hemorrhage, left eye: Secondary | ICD-10-CM | POA: Diagnosis not present

## 2023-06-28 DIAGNOSIS — L89322 Pressure ulcer of left buttock, stage 2: Secondary | ICD-10-CM | POA: Diagnosis present

## 2023-06-28 LAB — GLUCOSE, CAPILLARY
Glucose-Capillary: 86 mg/dL (ref 70–99)
Glucose-Capillary: 81 mg/dL (ref 70–99)
Glucose-Capillary: 102 mg/dL — ABNORMAL HIGH (ref 70–99)

## 2023-06-28 MED ORDER — GERHARDT'S BUTT CREAM: 1.0000 | TOPICAL_CREAM | Freq: Two times a day (BID) | CUTANEOUS | Status: DC

## 2023-06-28 MED ORDER — DAPTOMYCIN IV (FOR PTA / DISCHARGE USE ONLY): 800.0000 mg | INTRAVENOUS | 0 refills | Status: DC

## 2023-06-28 MED ORDER — INSULIN ASPART 100 UNIT/ML IJ SOLN: 0.0000 [IU] | Freq: Three times a day (TID) | INTRAMUSCULAR | 11 refills | Status: DC

## 2023-06-28 MED ORDER — DOCUSATE SODIUM 100 MG PO CAPS: 100.0000 mg | ORAL_CAPSULE | Freq: Two times a day (BID) | ORAL | 0 refills | Status: DC

## 2023-06-28 MED ORDER — MEDIHONEY WOUND/BURN DRESSING EX PSTE: 1.0000 | PASTE | Freq: Every day | CUTANEOUS | Status: DC

## 2023-06-28 MED ORDER — OXYCODONE HCL 5 MG PO TABS: 5.0000 mg | ORAL_TABLET | ORAL | 0 refills | Status: DC | PRN

## 2023-06-28 MED ORDER — ERTAPENEM IV (FOR PTA / DISCHARGE USE ONLY): 1.0000 g | INTRAVENOUS | 0 refills | Status: DC

## 2023-06-28 MED ORDER — INSULIN GLARGINE-YFGN 100 UNIT/ML ~~LOC~~ SOLN: 15.0000 [IU] | Freq: Every day | SUBCUTANEOUS | 11 refills | Status: DC

## 2023-06-28 MED ORDER — POLYETHYLENE GLYCOL 3350 17 G PO PACK: 17.0000 g | PACK | Freq: Every day | ORAL | 0 refills | Status: DC | PRN

## 2023-06-28 MED ORDER — INSULIN ASPART 100 UNIT/ML IJ SOLN
0.0000 [IU] | Freq: Every day | INTRAMUSCULAR | 11 refills | Status: DC
Start: 2023-06-28 — End: 2023-07-22

## 2023-06-28 MED ORDER — ALTEPLASE 2 MG IJ SOLR
2.0000 mg | Freq: Once | INTRAMUSCULAR | Status: AC
  Administered 2023-06-28: 2 mg
  Filled 2023-06-28: qty 2

## 2023-06-28 MED ORDER — JUVEN PO PACK: 1.0000 | PACK | Freq: Two times a day (BID) | ORAL | Status: DC

## 2023-06-28 NOTE — Plan of Care (Signed)

## 2023-06-28 NOTE — Discharge Summary (Signed)
 Physician Discharge Summary  Patient ID: Harold [name] MRN: [id number] DOB/AGE: [birth year] 68 y.o.  Admit date: 06/19/2023 Discharge date: 06/28/2023  Admission Diagnoses:  Discharge Diagnoses:  Principal Problem:   Hardware complicating wound infection [facility]) Active Problems:   Essential hypertension   CAD, LAD/CFX DES 04/28/12- staged RCA DES 04/29/12 after pt declined CABG, cath 05/14/13 stable CAD medical therapy   OSA on CPAP   Pulmonary hypertension,   Chronic renal disease, stage IV (HCC)   Obesity, Class III, BMI 40-49.9 (morbid obesity)   COPD (chronic obstructive pulmonary disease) (HCC)   Restrictive lung disease   Chronic heart failure with preserved ejection fraction (HFpEF) (HCC)   Gastroesophageal reflux disease without esophagitis   Paroxysmal atrial fibrillation (HCC)   Uncontrolled type 2 diabetes mellitus with hyperglycemia, with long-term current use of insulin  (HCC)   Acquired hypothyroidism   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, right (HCC)   PAD (peripheral artery disease) (HCC)   Hyponatremia   Discharged Condition: stable  Hospital Course: Patient is a 68 year old male with past medical history significant for coronary artery disease status post PCI, chronic kidney disease stage IV, COPD, PAH, OSA, diabetes mellitus with neuropathy, chronic venous insufficiency, orthostatic hypotension, hyperlipidemia, diabetic foot ulcer, traumatic right ankle fracture on 12/2022 requiring hardware placement complicated by need for multiple surgeries and infection with Enterobacter cloacae bacteremia and recent right ankle arthrodesis on 05/15/2023.  Presented with pain, bleeding 9 days post right foot hardware.  Patient was admitted for further assessment and management.  Orthopedic team and infectious disease team were consulted to assist with patient's management.  Orthopedic team did remove the exposed hardware.  Infectious disease team has advised  discharge antibiotics.  Patient will be discharged to skilled nursing facility.  Orthostatic hypotension remained during the hospital stay.  Please ensure adequate precautions for orthostatic hypotension.  Patient be discharged to skilled nursing facility.  Patient will follow-up with primary care provider, orthopedic team and infectious disease team on discharge.  Right ankle hardware infection: - Patient has history of traumatic right ankle fracture on 12/2022 requiring hardware placement complicated by need for multiple surgeries and infection with Enterobacter cloacae bacteremia and recent right ankle arthrodesis on 05/15/2023.  Patient seemed  to be on Cipro  outpatient. -S/p right ankle hardware removal by Dr. Curtiss Dowdy on 06/21/2023 -ID recommended PICC line and 6 weeks of antibiotics with ertapenem  and daptomycin . -Patient will discharge to skilled nursing facility.  Patient has been cleared for discharge by the orthopedic team.  Type 2 diabetes mellitus with hyperglycemia, neuropathy and diabetic ulcer: - A1c 7.0% on 12/2022 Last Labs         Recent Labs  Lab 06/26/23 1113 06/26/23 1636 06/26/23 2214 06/27/23 0614 06/27/23 1159  GLUCAP 138* 134* 119* 116* 124*    -Continue Semglee  15 units at night -Continue sliding scale insulin  coverage.     AKI on CKD-3A: -Thought to have CKD 4 but renal function improved tremendously suggesting CKD-3A Recent Labs (within last 365 days)               Recent Labs    05/19/23 0759 05/20/23 0614 05/21/23 0504 05/26/23 1420 06/19/23 2023 06/20/23 0114 06/20/23 0115 06/21/23 4098 06/22/23 0546 06/23/23 0959 06/25/23 0530  BUN 27* 26* 26* 33* 17  --  18 15 16 14 14   CREATININE 2.76* 2.62* 2.38* 2.47* 2.18* 2.12* 2.14* 2.05* 2.14* 1.90* 1.56*    -Continue monitoring - Avoid or minimize nephrotoxic meds 06/27/2023: AKI  has resolved significantly.   Paroxysmal A-fib: Rate controlled -Continue amiodarone  and Eliquis    PAH/OSA - CPAP at  night - Minimize sedating medications   Orthostatic hypotension: - Chronic. - Resting SBP elevated but drops to 80s with sitting up -TED hose to LLE and abdominal binder.   - Continue midodrine .   - Orthostatic hypotension precautions. - Fall precautions.      Anemia of renal disease:  -Hgb dropped to 7.3 grams per deciliter on 06/20/2023.  Transfused 1 unit of packed red blood cell, with appropriate response.   -Baseline Hgb about 8.0.    Recent Labs (within last 365 days)              Recent Labs    05/19/23 0759 05/20/23 0614 05/26/23 1420 05/26/23 1427 06/19/23 2023 06/20/23 0114 06/21/23 1610 06/22/23 0546 06/23/23 0654 06/25/23 0530  HGB 7.5* 8.6* 10.0* 9.5* 8.8* 7.3* 8.7* 8.2* 9.0* 9.1*    -Continue monitoring H/H.     PAD:  - Continue home Lipitor and Plavix    Hypothyroidism:  -Continue levothyroxine    BPH - Continue home Flomax    GERD - Continue PPI   History of gout -Continue allopurinol    Class II obesity Body mass index is 36.22 kg/m.   Pressure skin injury: Present on arrival     Pressure Injury 06/20/23 Sacrum Right Unstageable - Full thickness tissue loss in which the base of the injury is covered by slough (yellow, tan, gray, green or brown) and/or eschar (tan, brown or black) in the wound bed. (Active)  06/20/23 0050  Location: Sacrum  Location Orientation: Right  Staging: Unstageable - Full thickness tissue loss in which the base of the injury is covered by slough (yellow, tan, gray, green or brown) and/or eschar (tan, brown or black) in the wound bed.  Wound Description (Comments):   Present on Admission: Yes  Dressing Type Foam - Lift dressing to assess site every shift 06/20/23 0358     Consults: ID and orthopedic surgery  Significant Diagnostic Studies:  RIGHT FOOT - 2 VIEW; RIGHT ANKLE - COMPLETE 3+ VIEW:   COMPARISON:  Right ankle 05/15/2023   FINDINGS: Two views of the right foot and three views of the right ankle  are obtained.   Postoperative changes with intramedullary rod extending from the calcaneus through the talus and into the distal tibia. Two screws fix the proximal aspect of the rod and 2 screws fix the calcaneus. Old pin tracts in the distal fibula and tibia. Old healed and healing fracture deformities of the distal fibular shaft and medial malleolus. Mildly displaced fracture deformity of the posterior tibial malleolus. Appearance of surgical hardware and fracture fragments is similar to prior study. No new acute changes are demonstrated. Degenerative changes in the ankle joint and intertarsal joints. Degenerative changes in the first metatarsal-phalangeal joint. Soft tissue swelling over the dorsum of the foot. Vascular calcifications.   IMPRESSION: Postoperative and posttraumatic changes in the right foot and ankle as described. No significant change since previous study. Diffuse degenerative changes. Soft tissue swelling over the dorsum of the foot.     Electronically Signed   By: Boyce Byes M.D.   On: 06/19/2023 20:58   Treatments:  Removal of hardware right foot   Discharge Exam: Blood pressure (!) 155/54, pulse 65, temperature 97.7 F (36.5 C), temperature source Oral, resp. rate 19, height 6' 0.99" (1.854 m), weight 124.5 kg, SpO2 100%.   Disposition: Discharge disposition: 03-Skilled Nursing Facility  Discharge Instructions     Diet - low sodium heart healthy   Complete by: As directed    Discharge wound care:   Complete by: As directed    Continue current wound care   Home infusion instructions - [facility]   Complete by: As directed    Instructions: Flush IV access with Sodium Chloride  0.9% and Heparin  10units/ml or 100units/ml   Change dressing on IV access line: Weekly and PRN   Instructions Cath Flo 2mg : Administer for PICC Line occlusion and as ordered by physician for other access device   [facility] pharmacist  to adjust dose for: Vancomycin , Aminoglycosides and other anti-infective therapies as requested by physician   Increase activity slowly   Complete by: As directed       Allergies as of 06/28/2023       Reactions   Beta-lactamase Inhibitors (beta-lactam) Hives, Itching, Rash   AX - penicillin     Ancef  given 9/24 with no obvious reaction   Metformin  And Related Diarrhea, Nausea Only   Other Nausea And Vomiting   General Anesthesia - sometimes causes nausea and vomiting * OK to use scopolamine  patch     Sulfa Antibiotics Hives, Other (See Comments)   Bactrim   Wellbutrin [bupropion] Other (See Comments)   Mood Changes   Tetracyclines & Related Hives, Rash   doxycycline         Medication List     STOP taking these medications    ciprofloxacin  500 MG tablet Commonly known as: CIPRO    potassium chloride  10 MEQ tablet Commonly known as: KLOR-CON  M   Sodium Fluoride 5000 PPM 1.1 % Pste Generic drug: Sodium Fluoride   Tresiba  FlexTouch 200 UNIT/ML FlexTouch Pen Generic drug: insulin  degludec   Vitamin D  50 MCG (2000 UT) Caps       TAKE these medications    acetaminophen  500 MG tablet Commonly known as: TYLENOL  Take 1 tablet (500 mg total) by mouth every 6 (six) hours as needed for mild pain (pain score 1-3).   allopurinol  100 MG tablet Commonly known as: ZYLOPRIM  Take 0.5 tablets (50 mg total) by mouth daily.   amiodarone  200 MG tablet Commonly known as: PACERONE  Take 1 tablet (200 mg total) by mouth 2 (two) times daily.   atorvastatin  40 MG tablet Commonly known as: LIPITOR Take 1 tablet (40 mg total) by mouth daily. What changed: when to take this   clopidogrel  75 MG tablet Commonly known as: PLAVIX  Take 75 mg by mouth every morning.   daptomycin  IVPB Commonly known as: CUBICIN  Inject 800 mg into the vein daily. Indication:  Hardware associated ankle osteomyelitis  First Dose: Yes Last Day of Therapy:  08/02/23 Labs - Once weekly:  CBC/D, BMP, and  CPK Labs - Once weekly: ESR and CRP   docusate sodium  100 MG capsule Commonly known as: COLACE Take 1 capsule (100 mg total) by mouth 2 (two) times daily.   DULoxetine  60 MG capsule Commonly known as: CYMBALTA  Take 60 mg by mouth 2 (two) times daily.   Eliquis  5 MG Tabs tablet Generic drug: apixaban  TAKE 1 TABLET BY MOUTH TWICE A DAY   ertapenem  IVPB Commonly known as: INVANZ  Inject 1 g into the vein daily. Indication:  Hardware associated ankle osteomyelitis  First Dose: Yes Last Day of Therapy:  08/02/23 Labs - Once weekly:  CBC/D and BMP, Labs - Once weekly: ESR and CRP   ezetimibe  10 MG tablet Commonly known as: ZETIA  Take 10 mg  by mouth at bedtime.   ferrous sulfate  325 (65 FE) MG tablet Take 325 mg by mouth 3 (three) times a week. Monday, Wednesday, Friday   FreeStyle Libre 3 Plus Sensor Misc Inject 1 Device into the skin every 14 (fourteen) days.   furosemide  80 MG tablet Commonly known as: LASIX  Take 0.5 tablets (40 mg total) by mouth daily. May take additional tablet for weight gain of 3 pounds in one day or five pounds in one week   gabapentin  300 MG capsule Commonly known as: NEURONTIN  Take 1 capsule (300 mg total) by mouth at bedtime.   Gerhardt's butt cream Crea Apply 1 Application topically 2 (two) times daily.   insulin  aspart 100 UNIT/ML injection Commonly known as: novoLOG  Inject 0-15 Units into the skin 3 (three) times daily with meals.   insulin  aspart 100 UNIT/ML injection Commonly known as: novoLOG  Inject 0-5 Units into the skin at bedtime.   insulin  glargine-yfgn 100 UNIT/ML injection Commonly known as: SEMGLEE  Inject 0.15 mLs (15 Units total) into the skin at bedtime.   leptospermum manuka honey Pste paste Apply 1 Application topically daily. Start taking on: Jun 29, 2023   levothyroxine  88 MCG tablet Commonly known as: SYNTHROID  Take 88 mcg by mouth daily before breakfast.   magnesium  oxide 400 MG tablet Commonly known as:  MAG-OX Take 1 tablet by mouth daily.   methocarbamol  500 MG tablet Commonly known as: ROBAXIN  Take 1 tablet (500 mg total) by mouth every 6 (six) hours as needed for muscle spasms.   midodrine  5 MG tablet Commonly known as: PROAMATINE  Take 5 mg by mouth in the morning and at bedtime. In the morning and afternoon   midodrine  10 MG tablet Commonly known as: PROAMATINE  Take 10 mg by mouth at bedtime.   Mounjaro  15 MG/0.5ML Pen Generic drug: tirzepatide  Inject 15 mg into the skin once a week. What changed: additional instructions   multivitamin with minerals Tabs tablet Take 1 tablet by mouth daily.   nitroGLYCERIN  0.4 MG SL tablet Commonly known as: NITROSTAT  PLACE 1 TAB UNDER THE TONGUE EVERY 5 MINUTES AS NEEDED FOR CHEST PAIN (SEVERE PRESSURE OR TIGHTNESS) What changed: See the new instructions.   nutrition supplement (JUVEN) Pack Take 1 packet by mouth 2 (two) times daily between meals.   oxyCODONE  5 MG immediate release tablet Commonly known as: Oxy IR/ROXICODONE  Take 1 tablet (5 mg total) by mouth every 4 (four) hours as needed for severe pain (pain score 7-10).   pantoprazole  40 MG tablet Commonly known as: PROTONIX  TAKE 1 TABLET BY MOUTH EVERY DAY   polyethylene glycol 17 g packet Commonly known as: MIRALAX  / GLYCOLAX  Take 17 g by mouth daily as needed for mild constipation.   tamsulosin  0.4 MG Caps capsule Commonly known as: FLOMAX  Take 0.4 mg by mouth daily.   Vitamin D  (Ergocalciferol ) 1.25 MG (50000 UNIT) Caps capsule Commonly known as: DRISDOL  Take 1 capsule (50,000 Units total) by mouth every 7 (seven) days. What changed: additional instructions               Discharge Care Instructions  (From admission, onward)           Start     Ordered   06/28/23 0000  Discharge wound care:       Comments: Continue current wound care   06/28/23 1415            Time spent: 35 minutes.  SignedDoroteo Gasmen 06/28/2023, 2:34 PM

## 2023-06-28 NOTE — TOC Transition Note (Addendum)
 Transition of Care [facility]) - Discharge Note   Patient Details  Name: Harold [name] MRN: [id number] Date of Birth: 1955/08/28  [facility] CM/SW Contact:  Adline Hook, LCSW Phone Number: 06/28/2023, 4:19 PM   Clinical Narrative:     Per MD patient ready for DC to [facility]. RN, patient, patient's family, and facility notified of DC. Discharge Summary and FL2 sent to facility. DC packet on chart. Insurance [name] has been received.  Ambulance transport requested for patient.    RN to call report to [phone]. He will go to room 109. Pt is also aware that someone will need to bring his cpap machine to the facility.   CSW will sign off for now as social work intervention is no longer needed. Please consult us  again if new needs arise.    Final next level of care: Skilled Nursing Facility Barriers to Discharge: Insurance Authorization   Patient Goals and CMS Choice            Discharge Placement              Patient chooses bed at: [facility] Patient to be transferred to facility by: [facility] Name of family member notified: spouse Patient and family notified of of transfer: 06/28/23  Discharge Plan and Services Additional resources added to the After Visit Summary for                                       Social Drivers of Health (SDOH) Interventions SDOH Screenings   Food Insecurity: No Food Insecurity (06/20/2023)  Housing: Low Risk  (06/20/2023)  Transportation Needs: No Transportation Needs (06/20/2023)  Utilities: Not At Risk (06/20/2023)  Social Connections: Moderately Integrated (06/20/2023)  Tobacco Use: Medium Risk (06/21/2023)     Readmission Risk Interventions     No data to display

## 2023-06-29 DIAGNOSIS — M86171 Other acute osteomyelitis, right ankle and foot: Secondary | ICD-10-CM | POA: Diagnosis not present

## 2023-06-29 DIAGNOSIS — L97509 Non-pressure chronic ulcer of other part of unspecified foot with unspecified severity: Secondary | ICD-10-CM | POA: Diagnosis not present

## 2023-06-29 DIAGNOSIS — R531 Weakness: Secondary | ICD-10-CM | POA: Diagnosis not present

## 2023-06-29 DIAGNOSIS — Z7409 Other reduced mobility: Secondary | ICD-10-CM | POA: Diagnosis not present

## 2023-06-29 DIAGNOSIS — J449 Chronic obstructive pulmonary disease, unspecified: Secondary | ICD-10-CM | POA: Diagnosis not present

## 2023-06-29 DIAGNOSIS — E11621 Type 2 diabetes mellitus with foot ulcer: Secondary | ICD-10-CM | POA: Diagnosis not present

## 2023-06-29 DIAGNOSIS — Z794 Long term (current) use of insulin: Secondary | ICD-10-CM | POA: Diagnosis not present

## 2023-06-29 DIAGNOSIS — N189 Chronic kidney disease, unspecified: Secondary | ICD-10-CM | POA: Diagnosis not present

## 2023-06-29 DIAGNOSIS — D631 Anemia in chronic kidney disease: Secondary | ICD-10-CM | POA: Diagnosis not present

## 2023-06-29 DIAGNOSIS — I2510 Atherosclerotic heart disease of native coronary artery without angina pectoris: Secondary | ICD-10-CM | POA: Diagnosis not present

## 2023-06-29 DIAGNOSIS — I951 Orthostatic hypotension: Secondary | ICD-10-CM | POA: Diagnosis not present

## 2023-06-30 DIAGNOSIS — I951 Orthostatic hypotension: Secondary | ICD-10-CM | POA: Diagnosis not present

## 2023-06-30 DIAGNOSIS — R531 Weakness: Secondary | ICD-10-CM | POA: Diagnosis not present

## 2023-06-30 DIAGNOSIS — M86171 Other acute osteomyelitis, right ankle and foot: Secondary | ICD-10-CM | POA: Diagnosis not present

## 2023-06-30 DIAGNOSIS — Z7409 Other reduced mobility: Secondary | ICD-10-CM | POA: Diagnosis not present

## 2023-06-30 DIAGNOSIS — L97509 Non-pressure chronic ulcer of other part of unspecified foot with unspecified severity: Secondary | ICD-10-CM | POA: Diagnosis not present

## 2023-06-30 DIAGNOSIS — I872 Venous insufficiency (chronic) (peripheral): Secondary | ICD-10-CM | POA: Diagnosis not present

## 2023-06-30 NOTE — Progress Notes (Signed)
 [facility] Liaison Note  06/30/2023  DRESDEN STELLMACHER Jan 13, 1956 [id number]  Location: RN Hospital Liaison screened the patient remotely at [facility].  Insurance: [employer]   Harold [name] is a 68 y.o. male who is a Primary Care Patient of Amin, Saad, MD [facility]).  The patient was screened for readmission hospitalization with noted extreme risk score for unplanned readmission risk with 2 IP/1 ED in 6 months.  The patient was assessed for potential Care Management service needs for post hospital transition for care coordination. Review of patient's electronic medical record reveals patient was admitted for a post op wound infection. Pt transitioned to [facility]. Liaison will make a referral to PAC-RN to follow up accordingly.   VBCI Care Management/Population Health does not replace or interfere with any arrangements made by the Inpatient Transition of Care team.   For questions contact:   Lilla Reichert, RN, BSN Hospital Liaison [place]   [facility], Population Health Office Hours MTWF  8:00 am-6:00 pm Direct Dial: [phone] mobile [email or web address]

## 2023-07-01 ENCOUNTER — Ambulatory Visit: Admitting: Infectious Diseases

## 2023-07-01 DIAGNOSIS — G4733 Obstructive sleep apnea (adult) (pediatric): Secondary | ICD-10-CM | POA: Diagnosis not present

## 2023-07-01 DIAGNOSIS — I951 Orthostatic hypotension: Secondary | ICD-10-CM | POA: Diagnosis not present

## 2023-07-01 DIAGNOSIS — R531 Weakness: Secondary | ICD-10-CM | POA: Diagnosis not present

## 2023-07-01 DIAGNOSIS — M86171 Other acute osteomyelitis, right ankle and foot: Secondary | ICD-10-CM | POA: Diagnosis not present

## 2023-07-01 DIAGNOSIS — E785 Hyperlipidemia, unspecified: Secondary | ICD-10-CM | POA: Diagnosis not present

## 2023-07-01 DIAGNOSIS — E1140 Type 2 diabetes mellitus with diabetic neuropathy, unspecified: Secondary | ICD-10-CM | POA: Diagnosis not present

## 2023-07-01 DIAGNOSIS — Z794 Long term (current) use of insulin: Secondary | ICD-10-CM | POA: Diagnosis not present

## 2023-07-01 DIAGNOSIS — E8809 Other disorders of plasma-protein metabolism, not elsewhere classified: Secondary | ICD-10-CM | POA: Diagnosis not present

## 2023-07-01 DIAGNOSIS — D631 Anemia in chronic kidney disease: Secondary | ICD-10-CM | POA: Diagnosis not present

## 2023-07-01 DIAGNOSIS — E11621 Type 2 diabetes mellitus with foot ulcer: Secondary | ICD-10-CM | POA: Diagnosis not present

## 2023-07-01 DIAGNOSIS — I2510 Atherosclerotic heart disease of native coronary artery without angina pectoris: Secondary | ICD-10-CM | POA: Diagnosis not present

## 2023-07-01 DIAGNOSIS — N1831 Chronic kidney disease, stage 3a: Secondary | ICD-10-CM | POA: Diagnosis not present

## 2023-07-01 DIAGNOSIS — Z7409 Other reduced mobility: Secondary | ICD-10-CM | POA: Diagnosis not present

## 2023-07-02 ENCOUNTER — Telehealth: Payer: Self-pay | Admitting: Cardiovascular Disease

## 2023-07-02 DIAGNOSIS — I739 Peripheral vascular disease, unspecified: Secondary | ICD-10-CM

## 2023-07-02 DIAGNOSIS — J449 Chronic obstructive pulmonary disease, unspecified: Secondary | ICD-10-CM | POA: Diagnosis not present

## 2023-07-02 DIAGNOSIS — I130 Hypertensive heart and chronic kidney disease with heart failure and stage 1 through stage 4 chronic kidney disease, or unspecified chronic kidney disease: Secondary | ICD-10-CM | POA: Diagnosis not present

## 2023-07-02 DIAGNOSIS — D649 Anemia, unspecified: Secondary | ICD-10-CM | POA: Diagnosis not present

## 2023-07-02 DIAGNOSIS — I2510 Atherosclerotic heart disease of native coronary artery without angina pectoris: Secondary | ICD-10-CM | POA: Diagnosis not present

## 2023-07-02 DIAGNOSIS — I5032 Chronic diastolic (congestive) heart failure: Secondary | ICD-10-CM | POA: Diagnosis not present

## 2023-07-02 DIAGNOSIS — L97314 Non-pressure chronic ulcer of right ankle with necrosis of bone: Secondary | ICD-10-CM | POA: Diagnosis not present

## 2023-07-02 DIAGNOSIS — Z4889 Encounter for other specified surgical aftercare: Secondary | ICD-10-CM | POA: Diagnosis not present

## 2023-07-02 DIAGNOSIS — E1140 Type 2 diabetes mellitus with diabetic neuropathy, unspecified: Secondary | ICD-10-CM | POA: Diagnosis not present

## 2023-07-02 DIAGNOSIS — N189 Chronic kidney disease, unspecified: Secondary | ICD-10-CM | POA: Diagnosis not present

## 2023-07-02 DIAGNOSIS — I4821 Permanent atrial fibrillation: Secondary | ICD-10-CM | POA: Diagnosis not present

## 2023-07-02 DIAGNOSIS — R262 Difficulty in walking, not elsewhere classified: Secondary | ICD-10-CM | POA: Diagnosis not present

## 2023-07-02 DIAGNOSIS — T8140XD Infection following a procedure, unspecified, subsequent encounter: Secondary | ICD-10-CM | POA: Diagnosis not present

## 2023-07-02 DIAGNOSIS — E1165 Type 2 diabetes mellitus with hyperglycemia: Secondary | ICD-10-CM | POA: Diagnosis not present

## 2023-07-02 NOTE — Telephone Encounter (Signed)
 Requesting cb to discuss pt's hosp visit and if f/u is needed

## 2023-07-02 NOTE — Telephone Encounter (Signed)
 Spoke with pt's spouse and he had wanted to know what appts he needed to reschedule from previous missed appts d/t being hospitalized. Asked pt's spouse if he normally is seen by Dr. Addie Holstein or Dr. Alvenia Aus, he stated that the pt has been seen by both and he is unsure of who an appt would need to be made with. Will forward to Dr. Addie Holstein and Dr. Alvenia Aus for further help on this.

## 2023-07-03 DIAGNOSIS — T847XXA Infection and inflammatory reaction due to other internal orthopedic prosthetic devices, implants and grafts, initial encounter: Secondary | ICD-10-CM | POA: Diagnosis not present

## 2023-07-03 DIAGNOSIS — R531 Weakness: Secondary | ICD-10-CM | POA: Diagnosis not present

## 2023-07-03 DIAGNOSIS — I951 Orthostatic hypotension: Secondary | ICD-10-CM | POA: Diagnosis not present

## 2023-07-03 DIAGNOSIS — D631 Anemia in chronic kidney disease: Secondary | ICD-10-CM | POA: Diagnosis not present

## 2023-07-03 NOTE — Telephone Encounter (Signed)
 He is due to see me in follow-up I have not seen him for quite some time with the exception of when he was in the hospital.

## 2023-07-04 NOTE — Addendum Note (Signed)
 Addended by: Otho Blitz on: 07/04/2023 03:07 PM   Modules accepted: Orders

## 2023-07-04 NOTE — Telephone Encounter (Signed)
 Spoke to the patient's  spouse, per dpr. He stated that the patient was currently residing at the [facility]. He has been advised that the patient will need an LEA/ABI and follow up with Dr. Alvenia Aus.  He is currently scheduled with [facility] but wants to see Dr. Alvenia Aus for these concerns since Dr. Alvenia Aus has been his PV provider for years.   Doppler appointment moved to 5/21 and follow up scheduled with Dr. Alvenia Aus on 5/27. The spouse asked that the dopplers and appointment with [facility] be canceled. He will arrange for the patient to be at the appointments the new appointments.

## 2023-07-06 DIAGNOSIS — Z7409 Other reduced mobility: Secondary | ICD-10-CM | POA: Diagnosis not present

## 2023-07-06 DIAGNOSIS — S0012XA Contusion of left eyelid and periocular area, initial encounter: Secondary | ICD-10-CM | POA: Diagnosis not present

## 2023-07-06 DIAGNOSIS — H1132 Conjunctival hemorrhage, left eye: Secondary | ICD-10-CM | POA: Diagnosis not present

## 2023-07-06 DIAGNOSIS — R531 Weakness: Secondary | ICD-10-CM | POA: Diagnosis not present

## 2023-07-06 DIAGNOSIS — N1831 Chronic kidney disease, stage 3a: Secondary | ICD-10-CM | POA: Diagnosis not present

## 2023-07-06 DIAGNOSIS — M86171 Other acute osteomyelitis, right ankle and foot: Secondary | ICD-10-CM | POA: Diagnosis not present

## 2023-07-07 DIAGNOSIS — M86171 Other acute osteomyelitis, right ankle and foot: Secondary | ICD-10-CM | POA: Diagnosis not present

## 2023-07-07 DIAGNOSIS — R531 Weakness: Secondary | ICD-10-CM | POA: Diagnosis not present

## 2023-07-07 DIAGNOSIS — Z7409 Other reduced mobility: Secondary | ICD-10-CM | POA: Diagnosis not present

## 2023-07-07 DIAGNOSIS — E8809 Other disorders of plasma-protein metabolism, not elsewhere classified: Secondary | ICD-10-CM | POA: Diagnosis not present

## 2023-07-07 DIAGNOSIS — S0012XA Contusion of left eyelid and periocular area, initial encounter: Secondary | ICD-10-CM | POA: Diagnosis not present

## 2023-07-08 DIAGNOSIS — R531 Weakness: Secondary | ICD-10-CM | POA: Diagnosis not present

## 2023-07-08 DIAGNOSIS — T847XXA Infection and inflammatory reaction due to other internal orthopedic prosthetic devices, implants and grafts, initial encounter: Secondary | ICD-10-CM | POA: Diagnosis not present

## 2023-07-08 DIAGNOSIS — H1132 Conjunctival hemorrhage, left eye: Secondary | ICD-10-CM | POA: Diagnosis not present

## 2023-07-08 DIAGNOSIS — N1831 Chronic kidney disease, stage 3a: Secondary | ICD-10-CM | POA: Diagnosis not present

## 2023-07-08 DIAGNOSIS — Z7409 Other reduced mobility: Secondary | ICD-10-CM | POA: Diagnosis not present

## 2023-07-08 DIAGNOSIS — S82851E Displaced trimalleolar fracture of right lower leg, subsequent encounter for open fracture type I or II with routine healing: Secondary | ICD-10-CM | POA: Diagnosis not present

## 2023-07-08 DIAGNOSIS — Z5181 Encounter for therapeutic drug level monitoring: Secondary | ICD-10-CM | POA: Diagnosis not present

## 2023-07-08 DIAGNOSIS — L97509 Non-pressure chronic ulcer of other part of unspecified foot with unspecified severity: Secondary | ICD-10-CM | POA: Diagnosis not present

## 2023-07-08 DIAGNOSIS — I951 Orthostatic hypotension: Secondary | ICD-10-CM | POA: Diagnosis not present

## 2023-07-09 ENCOUNTER — Encounter: Payer: Self-pay | Admitting: Cardiology

## 2023-07-09 ENCOUNTER — Ambulatory Visit (HOSPITAL_COMMUNITY): Admission: RE | Admit: 2023-07-09 | Source: Ambulatory Visit

## 2023-07-09 DIAGNOSIS — S0012XA Contusion of left eyelid and periocular area, initial encounter: Secondary | ICD-10-CM | POA: Diagnosis not present

## 2023-07-09 DIAGNOSIS — Z7409 Other reduced mobility: Secondary | ICD-10-CM | POA: Diagnosis not present

## 2023-07-09 DIAGNOSIS — R262 Difficulty in walking, not elsewhere classified: Secondary | ICD-10-CM | POA: Diagnosis not present

## 2023-07-09 DIAGNOSIS — I951 Orthostatic hypotension: Secondary | ICD-10-CM | POA: Diagnosis not present

## 2023-07-09 DIAGNOSIS — L97314 Non-pressure chronic ulcer of right ankle with necrosis of bone: Secondary | ICD-10-CM | POA: Diagnosis not present

## 2023-07-09 DIAGNOSIS — R58 Hemorrhage, not elsewhere classified: Secondary | ICD-10-CM | POA: Diagnosis not present

## 2023-07-09 DIAGNOSIS — T8140XD Infection following a procedure, unspecified, subsequent encounter: Secondary | ICD-10-CM | POA: Diagnosis not present

## 2023-07-09 DIAGNOSIS — Z4889 Encounter for other specified surgical aftercare: Secondary | ICD-10-CM | POA: Diagnosis not present

## 2023-07-09 DIAGNOSIS — R531 Weakness: Secondary | ICD-10-CM | POA: Diagnosis not present

## 2023-07-09 DIAGNOSIS — K068 Other specified disorders of gingiva and edentulous alveolar ridge: Secondary | ICD-10-CM | POA: Diagnosis not present

## 2023-07-09 DIAGNOSIS — H1132 Conjunctival hemorrhage, left eye: Secondary | ICD-10-CM | POA: Diagnosis not present

## 2023-07-09 NOTE — Telephone Encounter (Signed)
 I know he does not usually manage this but I would ask Dr. Alvenia Aus for input since he will be seeing Ambrosio Junker before I see him.  It may be that we need to go up to 10 mg on the midodrine  as a standing dose as opposed to 5. Other things would be to liberalize his salt intake and increase fluid hydration. If able, compression stockings (may need to get this approved time). Abdominal binders are also potential treatment choice.  Will try to avoid Florinef which is a hormonal treatment that would help hold onto fluids.  If there had not been that many recent episodes of A-fib, we could also back down on the amiodarone  to 1/2 tablet daily.   Randene Bustard, MD

## 2023-07-10 DIAGNOSIS — I951 Orthostatic hypotension: Secondary | ICD-10-CM | POA: Diagnosis not present

## 2023-07-10 DIAGNOSIS — K068 Other specified disorders of gingiva and edentulous alveolar ridge: Secondary | ICD-10-CM | POA: Diagnosis not present

## 2023-07-10 DIAGNOSIS — R58 Hemorrhage, not elsewhere classified: Secondary | ICD-10-CM | POA: Diagnosis not present

## 2023-07-10 DIAGNOSIS — H1132 Conjunctival hemorrhage, left eye: Secondary | ICD-10-CM | POA: Diagnosis not present

## 2023-07-10 DIAGNOSIS — S0012XA Contusion of left eyelid and periocular area, initial encounter: Secondary | ICD-10-CM | POA: Diagnosis not present

## 2023-07-10 DIAGNOSIS — Z7409 Other reduced mobility: Secondary | ICD-10-CM | POA: Diagnosis not present

## 2023-07-10 DIAGNOSIS — R531 Weakness: Secondary | ICD-10-CM | POA: Diagnosis not present

## 2023-07-11 DIAGNOSIS — R58 Hemorrhage, not elsewhere classified: Secondary | ICD-10-CM | POA: Diagnosis not present

## 2023-07-11 DIAGNOSIS — Z7409 Other reduced mobility: Secondary | ICD-10-CM | POA: Diagnosis not present

## 2023-07-11 DIAGNOSIS — R531 Weakness: Secondary | ICD-10-CM | POA: Diagnosis not present

## 2023-07-11 DIAGNOSIS — T847XXA Infection and inflammatory reaction due to other internal orthopedic prosthetic devices, implants and grafts, initial encounter: Secondary | ICD-10-CM | POA: Diagnosis not present

## 2023-07-11 DIAGNOSIS — L97509 Non-pressure chronic ulcer of other part of unspecified foot with unspecified severity: Secondary | ICD-10-CM | POA: Diagnosis not present

## 2023-07-11 DIAGNOSIS — I951 Orthostatic hypotension: Secondary | ICD-10-CM | POA: Diagnosis not present

## 2023-07-12 DIAGNOSIS — R58 Hemorrhage, not elsewhere classified: Secondary | ICD-10-CM | POA: Diagnosis not present

## 2023-07-12 DIAGNOSIS — L97509 Non-pressure chronic ulcer of other part of unspecified foot with unspecified severity: Secondary | ICD-10-CM | POA: Diagnosis not present

## 2023-07-12 DIAGNOSIS — R531 Weakness: Secondary | ICD-10-CM | POA: Diagnosis not present

## 2023-07-12 DIAGNOSIS — I951 Orthostatic hypotension: Secondary | ICD-10-CM | POA: Diagnosis not present

## 2023-07-12 DIAGNOSIS — T847XXA Infection and inflammatory reaction due to other internal orthopedic prosthetic devices, implants and grafts, initial encounter: Secondary | ICD-10-CM | POA: Diagnosis not present

## 2023-07-14 DIAGNOSIS — R531 Weakness: Secondary | ICD-10-CM | POA: Diagnosis not present

## 2023-07-14 DIAGNOSIS — T847XXA Infection and inflammatory reaction due to other internal orthopedic prosthetic devices, implants and grafts, initial encounter: Secondary | ICD-10-CM | POA: Diagnosis not present

## 2023-07-14 DIAGNOSIS — Z7409 Other reduced mobility: Secondary | ICD-10-CM | POA: Diagnosis not present

## 2023-07-14 DIAGNOSIS — I951 Orthostatic hypotension: Secondary | ICD-10-CM | POA: Diagnosis not present

## 2023-07-14 DIAGNOSIS — L97509 Non-pressure chronic ulcer of other part of unspecified foot with unspecified severity: Secondary | ICD-10-CM | POA: Diagnosis not present

## 2023-07-15 ENCOUNTER — Ambulatory Visit: Attending: Cardiovascular Disease | Admitting: Cardiovascular Disease

## 2023-07-15 ENCOUNTER — Encounter: Payer: Self-pay | Admitting: Cardiovascular Disease

## 2023-07-15 VITALS — BP 100/68 | HR 75 | Ht 72.0 in | Wt 234.0 lb

## 2023-07-15 DIAGNOSIS — I4819 Other persistent atrial fibrillation: Secondary | ICD-10-CM | POA: Diagnosis not present

## 2023-07-15 DIAGNOSIS — I739 Peripheral vascular disease, unspecified: Secondary | ICD-10-CM

## 2023-07-15 DIAGNOSIS — I951 Orthostatic hypotension: Secondary | ICD-10-CM | POA: Diagnosis not present

## 2023-07-15 DIAGNOSIS — L97509 Non-pressure chronic ulcer of other part of unspecified foot with unspecified severity: Secondary | ICD-10-CM | POA: Diagnosis not present

## 2023-07-15 DIAGNOSIS — I10 Essential (primary) hypertension: Secondary | ICD-10-CM | POA: Diagnosis not present

## 2023-07-15 DIAGNOSIS — E785 Hyperlipidemia, unspecified: Secondary | ICD-10-CM | POA: Diagnosis not present

## 2023-07-15 DIAGNOSIS — I2510 Atherosclerotic heart disease of native coronary artery without angina pectoris: Secondary | ICD-10-CM | POA: Diagnosis not present

## 2023-07-15 DIAGNOSIS — T847XXA Infection and inflammatory reaction due to other internal orthopedic prosthetic devices, implants and grafts, initial encounter: Secondary | ICD-10-CM | POA: Diagnosis not present

## 2023-07-15 DIAGNOSIS — Z7409 Other reduced mobility: Secondary | ICD-10-CM | POA: Diagnosis not present

## 2023-07-15 DIAGNOSIS — R531 Weakness: Secondary | ICD-10-CM | POA: Diagnosis not present

## 2023-07-15 MED ORDER — FLUDROCORTISONE ACETATE 0.1 MG PO TABS: 0.1000 mg | ORAL_TABLET | Freq: Every day | ORAL | 1 refills | Status: DC

## 2023-07-15 MED ORDER — MIDODRINE HCL 10 MG PO TABS: 10.0000 mg | ORAL_TABLET | Freq: Three times a day (TID) | ORAL | 3 refills | Status: DC

## 2023-07-15 NOTE — Progress Notes (Unsigned)
 Cardiology Office Note   Date:  07/16/2023   ID:  Harold [name], DOB [birth year], MRN [id number]  PCP:  Tita Form, MD  Cardiologist: Dr. Addie Holstein.  No chief complaint on file.      History of Present Illness: Harold [name] is a 68 y.o. male who is here today for follow-up visit regarding peripheral arterial disease.    He has known history of carotid disease, obstructive sleep apnea on CPAP, peripheral arterial disease, essential hypertension, obesity, chronic kidney disease, type 2 diabetes , Atrial fibrillation and hyperlipidemia.  He has known history of coronary artery disease with previous stenting.  He is not a smoker. He was seen in 2021 for bilateral calf claudication worse on the right. He underwent noninvasive vascular evaluation which showed an ABI of 0.72 on the right and 0.90 on the left. Duplex showed severe disease in the right TP trunk with occluded anterior tibial. On the left, the anterior tibial was occluded. Angiography was performed in August 2021 which showed mild nonobstructive iliac disease.  Imaging of the right lower extremity showed no obstructive disease affecting the SFA or popliteal artery.  There was significant calcified stenosis in the TP trunk with occluded anterior tibial artery and significant disease in the proximal posterior tibial artery which was the dominant vessel below the knee.  I performed successful balloon angioplasty of the right TP trunk into the posterior tibial artery followed by drug-coated balloon angioplasty to the TP trunk.   He developed atrial fibrillation in 2023 and has been treated with amiodarone  and anticoagulation.  He had recurrent wounds on the left lower extremity in 2024.  Angiography was done in September 2024 which showed severe calcified stenosis in the mid popliteal artery with extension into the TP trunk and bifurcation of the posterior tibial and peroneal arteries.  The anterior tibial artery was  occluded proximally.  I performed successful orbital atherectomy and drug-coated balloon angioplasty to the left popliteal artery, TP trunk and posterior tibial artery.  The wounds of the left lower extremity healed.  Unfortunately, he fell last year and fractured his right leg.  He had surgical repair but returned with infected hardware.  There was concern about arterial insufficiency and he was seen by Dr. Charlotte Cookey who performed an angiogram which showed heavily calcified severe stenosis in the distal right SFA.  This was treated with Eluvia drug-eluting stent placement.  He had recurrent hospitalizations due to issues with infected hardware requiring prolonged antibiotics and he developed acute on chronic kidney disease requiring brief dialysis.  In that setting, he developed severe orthostatic hypotension requiring discontinuation of antihypertensive medications.  He lost significant amount of weight and has been very weak and deconditioned.  He is currently in an inpatient rehab facility which has been limited by severe orthostatic hypotension which is highly symptomatic.  He is on midodrine .   Past Medical History:  Diagnosis Date   Anemia    Anginal pain (HCC) 04/2013   Cardiac cath showed patent stents with distal LAD, circumflex-OM and RCA disease in small vessels.   Anxiety    Atrial fibrillation (HCC) 12/2021   CAD S/P percutaneous coronary angioplasty 2003, 04/2012   status post PCI to LAD, circumflex-OM 2, RCA   Carotid artery occlusion    Chronic renal insufficiency, stage II (mild)    Chronic venous insufficiency    varicosities, no reflux; dopplers 04/14/12- valvular insufficiency in the R and L GSV   Complication of anesthesia    COPD,  mild (HCC)    Depression    situaltional    Diabetes mellitus    Diabetic neuropathy (HCC) 08/24/2019   Dyslipidemia associated with type 2 diabetes mellitus (HCC)    Fall at home, initial encounter 01/02/2023   GERD (gastroesophageal reflux  disease)    History of cardioversion 12/2021   at [facility]   HTN (hypertension)    Hypothyroidism    Neuromuscular disorder (HCC)    neuropathy in feet   Neuropathy    notably improved following PCI with improved cardiac function   Obesity    Obesity, Class II, BMI 35.0-39.9, with comorbidity (see actual BMI)    BMI 39; wgt loss efforts in place; seeing Dietician   Open ankle fracture 01/02/2023   Orthostatic hypotension    PONV (postoperative nausea and vomiting)    "[employer]"   Pulmonary hypertension (HCC) 04/2012   PA pressure   Sleep apnea    uses nightly   Vitamin D  deficiency    per facility notes    Past Surgical History:  Procedure Laterality Date   ABDOMINAL AORTAGRAM N/A 11/15/2011   Procedure: ABDOMINAL Tommi Fraise;  Surgeon: Richrd Char, MD;  Location: [facility] CATH LAB;  Service: Cardiovascular;  [name];   ABDOMINAL AORTOGRAM W/LOWER EXTREMITY N/A 10/13/2019   Procedure: ABDOMINAL AORTOGRAM W/LOWER EXTREMITY;  Surgeon: Wenona Hamilton, MD;  Location: [facility] INVASIVE CV LAB;  Service: CV: Non-obst Ao-Iliac. R SFA-PopA patent with significant calcific stenosis of TP trunk-prox PTA (dominant) & CTO ATA  -> R PTA-TP PTCA   ABDOMINAL AORTOGRAM W/LOWER EXTREMITY N/A 11/06/2022   Procedure: ABDOMINAL AORTOGRAM W/LOWER EXTREMITY;  Surgeon: Wenona Hamilton, MD;  Location: [facility] INVASIVE CV LAB;  Service: Cardiovascular;  [name];   ABDOMINAL AORTOGRAM W/LOWER EXTREMITY Right 04/01/2023   Procedure: ABDOMINAL AORTOGRAM W/LOWER EXTREMITY;  Surgeon: Margherita Shell, MD;  Location: [facility] INVASIVE CV LAB;  Service: Cardiovascular;  Laterality: Right;   ABIs  04/27/2012   mild bilateral arterial insufficiency   ANKLE FUSION Right 05/15/2023   Procedure: ARTHRODESIS ANKLE WITH HINDFOOT FUSION NAIL;  Surgeon: Laneta Pintos, MD;  [name];  Service: Orthopedics;  Laterality: Right;   APPLICATION OF WOUND VAC Right 03/31/2023   Procedure: APPLICATION OF  WOUND VAC;  Surgeon: Laneta Pintos, MD;  [name];  Service: Orthopedics;  Laterality: Right;   BACK SURGERY  2005 x1   X2-2010   BUNIONECTOMY Right 11/11/2013   Procedure: RIGHT FOOT SILVER BUNIONECTOMY;  Surgeon: Amada Backer, MD;  Location: [facility];  Service: Orthopedics;  Laterality: Right;   CARDIAC CATHETERIZATION  12/11/2001   significant 3V CAD, normal LV function   CARDIOVERSION N/A 03/13/2022   Procedure: CARDIOVERSION;  Surgeon: Wendie Hamburg, MD;  Location: [facility] ENDOSCOPY;  Service: Cardiovascular;  [name];   CARDIOVERSION N/A 07/25/2022   Procedure: CARDIOVERSION;  Surgeon: Hugh Madura, MD;  [facility] INVASIVE CV LAB;  Service: Cardiovascular;  [name];   Carotid Doppler  04/27/2012   right internal carotid: Elevated velocities but no evidence of plaque. Left internal carotid 40-59%   CAROTID ENDARTERECTOMY  2005   Right; recent carotid Dopplers notes elevated velocities.    colonscopy     CORONARY ANGIOPLASTY  12/21/2001   PTCA of the distal and mid AV groove circ, unsuccessful PTCA of second OM total occlusion, unsuccessful PTCA of the apical LAD total occlusion   CORONARY ANGIOPLASTY WITH STENT PLACEMENT  04/29/2012   PCI to 3  RCA lesions, Promus Premiere 2.62mmx8mm distally, mid was 2.62mx28mm and proximally 2.75x35mm, EF 55-60%   CORONARY STENT PLACEMENT  04/28/2012   PCI to LAD (3x34mm Xience DES postdilated to 3.25) and circ prox and mid (2 overlappinmg 2.4mmx12mmXience DES postdilated to 2.74mm)   DOPPLER ECHOCARDIOGRAPHY  04/28/2012   poor quality study: EF estimated 60-65%; unable to assess diastolic function (previously noted to have diastolic dysfunction); severely dilated left atrium and mild right atrium; dilated IVC consistent with increased central venous pressure.Aaron Aas   EXTERNAL FIXATION LEG Right 05/12/2023   Procedure: EXTERNAL FIXATION, LOWER EXTREMITY;  Surgeon: Laneta Pintos, MD;  [name];  Service:  Orthopedics;  Laterality: Right;   HARDWARE REMOVAL Right 03/31/2023   Procedure: HARDWARE REMOVAL RIGHT ANKLE;  Surgeon: Laneta Pintos, MD;  [name];  Service: Orthopedics;  Laterality: Right;   HARDWARE REMOVAL Right 01/28/2023   Procedure: REMOVAL OF HARDWARE RIGHT ANKLE;  Surgeon: Hardy Lia, MD;  [name];  Service: Orthopedics;  Laterality: Right;   HARDWARE REMOVAL Right 06/21/2023   Procedure: REMOVAL, HARDWARE;  Surgeon: Laneta Pintos, MD;  [name];  Service: Orthopedics;  Laterality: Right;   HERNIA REPAIR     I & D EXTREMITY Right 01/02/2023   Procedure: IRRIGATION AND DEBRIDEMENT RIGHT ANKLE;  Surgeon: Hardy Lia, MD;  [name];  Service: Orthopedics;  Laterality: Right;   I & D EXTREMITY Right 04/02/2023   Procedure: DEBRIDEMENT RIGHT ANKLE;  Surgeon: Timothy Ford, MD;  Location[facility] OR;  Service: Orthopedics;  Laterality: Right;   IR FLUORO GUIDE CV LINE LEFT  04/22/2023   IR FLUORO GUIDE CV LINE RIGHT  04/07/2023   IR PATIENT EVAL TECH 0-60 MINS  04/07/2023   IR RADIOLOGIST EVAL & MGMT  04/08/2023   IR REMOVAL TUN CV CATH W/O FL  05/05/2023   IR US  GUIDE VASC ACCESS LEFT  04/22/2023   IR US  GUIDE VASC ACCESS RIGHT  04/07/2023   LEFT AND RIGHT HEART CATHETERIZATION WITH CORONARY ANGIOGRAM N/A 04/27/2012   Procedure: LEFT AND RIGHT HEART CATHETERIZATION WITH CORONARY ANGIOGRAM;  Surgeon: Arleen Lacer, MD;  Location: [facility] CATH LAB;  Service: Cardiovascular;  [name];   LEFT AND RIGHT HEART CATHETERIZATION WITH CORONARY ANGIOGRAM N/A 05/14/2013   Procedure: LEFT AND RIGHT HEART CATHETERIZATION WITH CORONARY ANGIOGRAM;  Surgeon: Millicent Ally, MD;  [name]; PCWP ;; [name], [name], Patent RCA ostial mid and distal stents - 70% distal runoff Dz   LUMBAR LAMINECTOMY/DECOMPRESSION  MICRODISCECTOMY  01/07/2012   Procedure: LUMBAR LAMINECTOMY/DECOMPRESSION MICRODISCECTOMY 1 LEVEL;  Surgeon: Pasty Bongo, MD;  [name];  Service: Neurosurgery;  Laterality: Bilateral;   Lumbar Three-Four Decompression   LUMBAR LAMINECTOMY/DECOMPRESSION MICRODISCECTOMY N/A 07/26/2020   Procedure: Lumbar two-three Laminectomy/Foraminotomy;  Surgeon: Audie Bleacher, MD;  [name];  Service: Neurosurgery;  [name];   ORIF ANKLE FRACTURE Right 01/02/2023   Procedure: OPEN REDUCTION INTERNAL FIXATION (ORIF)RIGHT ANKLE FRACTURE;  Surgeon: Hardy Lia, MD;  [name];  Service: Orthopedics;  Laterality: Right;   PERCUTANEOUS CORONARY STENT INTERVENTION (PCI-S) N/A 04/28/2012   Procedure: PERCUTANEOUS CORONARY STENT INTERVENTION (PCI-S);  Surgeon: Millicent Ally, MD;  Location: [facility] CATH LAB;  Service: Cardiovascular;  [name];   PERCUTANEOUS CORONARY STENT INTERVENTION (PCI-S) N/A 04/29/2012   Procedure: PERCUTANEOUS CORONARY  STENT INTERVENTION (PCI-S);  Surgeon: Arleen Lacer, MD;  Location: [facility] CATH LAB;  Service: Cardiovascular;  [name];   PERIPHERAL VASCULAR ATHERECTOMY  11/06/2022   Procedure: PERIPHERAL VASCULAR ATHERECTOMY;  Surgeon: Wenona Hamilton, MD;  [facility] IN[facility]AS[facility] CV LAB;  Service: Cardiovascular;;   PERIPHERAL VASCULAR BALLOON ANGIOPLASTY Right 10/13/2019   Procedure: PERIPHERAL VASCULAR BALLOON ANGIOPLASTY;  Surgeon: Wenona Hamilton, MD;  [facility] INVAS[facility] CV LAB;  Service: Cardiovascular;  Laterality: Right;  PTCA of R TP Trunk into PTA (Drug-coated) => Follow-up ABIs 0.9 on the right and 0.99 on the left.   PERIPHERAL VASCULAR BALLOON ANGIOPLASTY Right 04/01/2023   Procedure: PERIPHERAL VASCULAR BALLOON ANGIOPLASTY;  Surgeon: Margherita Shell, MD;  [facility] INVASIVE CV LAB;  Service: Cardiovascular;  Laterality: Right;   PERIPHERAL VASCULAR INTERVENTION Right 04/01/2023   Procedure: PERIPHERAL VASCULAR INTERVENTION;   Surgeon: Margherita Shell, MD;  [facility] INVASIVE CV LAB;  Service: Cardiovascular;  Laterality: Right;   SPINE SURGERY     UMBILICAL HERNIA REPAIR  2009   steel mesh insert     Current Outpatient Medications  Medication Sig Dispense Refill   fludrocortisone (FLORINEF) 0.1 MG tablet Take 1 tablet (0.1 mg total) by mouth daily. 90 tablet 1   acetaminophen  (TYLENOL ) 500 MG tablet Take 1 tablet (500 mg total) by mouth every 6 (six) hours as needed for mild pain (pain score 1-3).     allopurinol  (ZYLOPRIM ) 100 MG tablet Take 0.5 tablets (50 mg total) by mouth daily. 15 tablet 0   amiodarone  (PACERONE ) 200 MG tablet Take 1 tablet (200 mg total) by mouth 2 (two) times daily. 60 tablet 0   apixaban  (ELIQUIS ) 5 MG TABS tablet TAKE 1 TABLET BY MOUTH TWICE A DAY 60 tablet 6   atorvastatin  (LIPITOR) 40 MG tablet Take 1 tablet (40 mg total) by mouth daily. (Patient taking differently: Take 40 mg by mouth at bedtime.) 30 tablet 0   clopidogrel  (PLAVIX ) 75 MG tablet Take 75 mg by mouth every morning.     Continuous Glucose Sensor (FREESTYLE LIBRE 3 PLUS SENSOR) MISC Inject 1 Device into the skin every 14 (fourteen) days.     daptomycin  (CUBICIN ) IVPB Inject 800 mg into the vein daily. Indication:  Hardware associated ankle osteomyelitis  First Dose: Yes Last Day of Therapy:  08/02/23 Labs - Once weekly:  CBC/D, BMP, and CPK Labs - Once weekly: ESR and CRP 40 Units 0   docusate sodium  (COLACE) 100 MG capsule Take 1 capsule (100 mg total) by mouth 2 (two) times daily. 10 capsule 0   DULoxetine  (CYMBALTA ) 60 MG capsule Take 60 mg by mouth 2 (two) times daily.     ertapenem  (INVANZ ) IVPB Inject 1 g into the vein daily. Indication:  Hardware associated ankle osteomyelitis  First Dose: Yes Last Day of Therapy:  08/02/23 Labs - Once weekly:  CBC/D and BMP, Labs - Once weekly: ESR and CRP 40 Units 0   ezetimibe  (ZETIA ) 10 MG tablet Take 10 mg by mouth at bedtime.     ferrous sulfate  325 (65 FE) MG tablet  Take 325 mg by mouth 3 (three) times a week. Monday, Wednesday, Friday     furosemide  (LASIX ) 80 MG tablet Take 0.5 tablets (40 mg total) by mouth daily. May take additional tablet for weight gain of 3 pounds in one day or five pounds in one week     gabapentin  (NEURONTIN ) 300 MG capsule Take 1 capsule (300 mg total) by mouth  at bedtime. 30 capsule 0   insulin  aspart (NOVOLOG ) 100 UNIT/ML injection Inject 0-15 Units into the skin 3 (three) times daily with meals. 10 mL 11   insulin  aspart (NOVOLOG ) 100 UNIT/ML injection Inject 0-5 Units into the skin at bedtime. 10 mL 11   insulin  glargine-yfgn (SEMGLEE ) 100 UNIT/ML injection Inject 0.15 mLs (15 Units total) into the skin at bedtime. 10 mL 11   leptospermum manuka honey (MEDIHONEY) PSTE paste Apply 1 Application topically daily.     levothyroxine  (SYNTHROID , LEVOTHROID) 88 MCG tablet Take 88 mcg by mouth daily before breakfast.     magnesium  oxide (MAG-OX) 400 MG tablet Take 1 tablet by mouth daily.     methocarbamol  (ROBAXIN ) 500 MG tablet Take 1 tablet (500 mg total) by mouth every 6 (six) hours as needed for muscle spasms. 28 tablet 0   midodrine  (PROAMATINE ) 10 MG tablet Take 1 tablet (10 mg total) by mouth 3 (three) times daily with meals. Take one hour prior to physical therapy 90 tablet 3   Multiple Vitamin (MULTIVITAMIN WITH MINERALS) TABS tablet Take 1 tablet by mouth daily.     nitroGLYCERIN  (NITROSTAT ) 0.4 MG SL tablet PLACE 1 TAB UNDER THE TONGUE EVERY 5 MINUTES AS NEEDED FOR CHEST PAIN (SEVERE PRESSURE OR TIGHTNESS) (Patient taking differently: Place 0.4 mg under the tongue every 5 (five) minutes as needed for chest pain. PLACE 1 TAB UNDER THE TONGUE EVERY 5 MINUTES AS NEEDED FOR CHEST PAIN (SEVERE PRESSURE OR TIGHTNESS)) 25 tablet 3   nutrition supplement, JUVEN, (JUVEN) PACK Take 1 packet by mouth 2 (two) times daily between meals.     Nystatin (GERHARDT'S BUTT CREAM) CREA Apply 1 Application topically 2 (two) times daily.     oxyCODONE   (OXY IR/ROXICODONE ) 5 MG immediate release tablet Take 1 tablet (5 mg total) by mouth every 4 (four) hours as needed for severe pain (pain score 7-10). 42 tablet 0   pantoprazole  (PROTONIX ) 40 MG tablet TAKE 1 TABLET BY MOUTH EVERY DAY 90 tablet 3   polyethylene glycol (MIRALAX  / GLYCOLAX ) 17 g packet Take 17 g by mouth daily as needed for mild constipation. 14 each 0   tamsulosin  (FLOMAX ) 0.4 MG CAPS capsule Take 0.4 mg by mouth daily.     tirzepatide  (MOUNJARO ) 15 MG/0.5ML Pen Inject 15 mg into the skin once a week. (Patient taking differently: Inject 15 mg into the skin once a week. Sunday) 6 mL 3   Vitamin D , Ergocalciferol , (DRISDOL ) 1.25 MG (50000 UNIT) CAPS capsule Take 1 capsule (50,000 Units total) by mouth every 7 (seven) days. (Patient taking differently: Take 50,000 Units by mouth every 7 (seven) days. Sundays) 4 capsule 0   No current facility-administered medications for this visit.    Allergies:   Beta-lactamase inhibitors (beta-lactam), Metformin  and related, Other, Sulfa antibiotics, Wellbutrin [bupropion], and Tetracyclines & related    Social History:  The patient  reports that he quit smoking about 20 years ago. His smoking use included cigarettes. He started smoking about 62 years ago. He has a 42 pack-year smoking history. He has never used smokeless tobacco. He reports that he does not drink alcohol and does not use drugs.   Family History:  The patient's He was adopted. Family history is unknown by patient.    ROS:  Please see the history of present illness.   Otherwise, review of systems are positive for none.   All other systems are reviewed and negative.    PHYSICAL EXAM: VS:  BP 100/68  Pulse 75   Ht 6' (1.829 m)   Wt 234 lb (106.1 kg)   SpO2 94%   BMI 31.74 kg/m  , BMI Body mass index is 31.74 kg/m. GEN: Well nourished, well developed, in no acute distress  HEENT: normal  Neck: no JVD, carotid bruits, or masses Cardiac: RRR; no murmurs, rubs, or  gallops,no edema  Respiratory:  clear to auscultation bilaterally, normal work of breathing GI: soft, nontender, nondistended, + BS MS: no deformity or atrophy  Skin: warm and dry, no rash Neuro:  Strength and sensation are intact Psych: euthymic mood, full affect    EKG:  EKG is not ordered today.    Recent Labs: 03/28/2023: TSH 5.045 04/17/2023: B Natriuretic Peptide 765.1 06/20/2023: ALT 12 06/25/2023: BUN 14; Creatinine, Ser 1.56; Hemoglobin 9.1; Magnesium  1.9; Platelets 134; Potassium 3.5; Sodium 134    Lipid Panel    Component Value Date/Time   CHOL 84 04/02/2023 0456   CHOL 178 05/12/2018 1150   TRIG 161 (H) 04/02/2023 0456   HDL 24 (L) 04/02/2023 0456   HDL 42 05/12/2018 1150   CHOLHDL 3.5 04/02/2023 0456   VLDL 32 04/02/2023 0456   LDLCALC 28 04/02/2023 0456   LDLCALC 67 05/12/2018 1150      Wt Readings from Last 3 Encounters:  07/15/23 234 lb (106.1 kg)  06/21/23 274 lb 7.6 oz (124.5 kg)  05/21/23 274 lb 7.6 oz (124.5 kg)           No data to display            ASSESSMENT AND PLAN:  1. Peripheral arterial disease: He is status post bilateral lower extremity endovascular intervention for critical limb ischemia.  Currently with no open wounds.  I requested follow-up ABI and lower extremity arterial duplex.   2. Coronary artery disease involving native coronary arteries without angina: Continue medical therapy.  3.  Severe orthostatic hypotension: This seems to be the biggest issue at this time.  I elected to change midodrine  to 10 mg 3 times daily and add small dose of Florinef 0.1 mg once daily.  4. Hyperlipidemia: Currently on rosuvastatin  and Zetia .  5.  Persistent atrial fibrillation: He seems to be in sinus rhythm.  He is supposed to be on amiodarone  and Eliquis  but I do not see these medications on his list.  Will have to verify.   Disposition:   Follow-up with me in 3 months.  Continue follow-up with Dr. Addie Holstein on a regular  basis.  Signed,  Antionette Kirks, MD  07/16/2023 8:00 AM     [facility]

## 2023-07-15 NOTE — Patient Instructions (Signed)
 Medication Instructions:  INCREASE the Midodrine  to 10 mg three times daily  START Florinef 0.1 mg once daily  *If you need a refill on your cardiac medications before your next appointment, please call your pharmacy*  Lab Work: None ordered If you have labs (blood work) drawn today and your tests are completely normal, you will receive your results only by: MyChart Message (if you have MyChart) OR A paper copy in the mail If you have any lab test that is abnormal or we need to change your treatment, we will call you to review the results.  Testing/Procedures: Your physician has requested that you have a lower extremity arterial duplex. During this test, ultrasound is used to evaluate arterial blood flow in the legs. Allow one hour for this exam. There are no restrictions or special instructions. This will take place at [address] [facility], 4th floor  Your physician has requested that you have an ankle brachial index (ABI). During this test an ultrasound and blood pressure cuff are used to evaluate the arteries that supply the arms and legs with blood. Allow thirty minutes for this exam. There are no restrictions or special instructions. This will take place at [address] 4th floor  Please note: We ask at that you not bring children with you during ultrasound (echo/ vascular) testing. Due to room size and safety concerns, children are not allowed in the ultrasound rooms during exams. Our front office staff cannot provide observation of children in our lobby area while testing is being conducted. An adult accompanying a patient to their appointment will only be allowed in the ultrasound room at the discretion of the ultrasound technician under special circumstances. We apologize for any inconvenience.  Follow-Up: At [facility], you and your health needs are our priority.  As part of our continuing mission to provide you with exceptional heart care, our providers are all part of  one team.  This team includes your primary Cardiologist (physician) and Advanced Practice Providers or APPs (Physician Assistants and Nurse Practitioners) who all work together to provide you with the care you need, when you need it.  Your next appointment:   3 month(s)  Provider:   Dr. Alvenia Aus  We recommend signing up for the patient portal called "MyChart".  Sign up information is provided on this After Visit Summary.  MyChart is used to connect with patients for Virtual Visits (Telemedicine).  Patients are able to view lab/test results, encounter notes, upcoming appointments, etc.  Non-urgent messages can be sent to your provider as well.   To learn more about what you can do with MyChart, go to [email or web address].

## 2023-07-16 DIAGNOSIS — R58 Hemorrhage, not elsewhere classified: Secondary | ICD-10-CM | POA: Diagnosis not present

## 2023-07-16 DIAGNOSIS — I951 Orthostatic hypotension: Secondary | ICD-10-CM | POA: Diagnosis not present

## 2023-07-16 DIAGNOSIS — R531 Weakness: Secondary | ICD-10-CM | POA: Diagnosis not present

## 2023-07-16 DIAGNOSIS — Z7409 Other reduced mobility: Secondary | ICD-10-CM | POA: Diagnosis not present

## 2023-07-16 DIAGNOSIS — T847XXA Infection and inflammatory reaction due to other internal orthopedic prosthetic devices, implants and grafts, initial encounter: Secondary | ICD-10-CM | POA: Diagnosis not present

## 2023-07-17 ENCOUNTER — Inpatient Hospital Stay (HOSPITAL_COMMUNITY)
Admission: EM | Admit: 2023-07-17 | Discharge: 2023-07-22 | DRG: 074 | Disposition: A | Discharge status: Skilled Nursing Facility | Source: Ambulatory Visit | Attending: Obstetrics and Gynecology | Admitting: Obstetrics and Gynecology

## 2023-07-17 ENCOUNTER — Emergency Department (HOSPITAL_COMMUNITY)

## 2023-07-17 ENCOUNTER — Telehealth: Payer: Self-pay

## 2023-07-17 ENCOUNTER — Other Ambulatory Visit: Payer: Self-pay

## 2023-07-17 ENCOUNTER — Encounter (HOSPITAL_COMMUNITY): Payer: Self-pay

## 2023-07-17 ENCOUNTER — Ambulatory Visit (INDEPENDENT_AMBULATORY_CARE_PROVIDER_SITE_OTHER): Admitting: Infectious Diseases

## 2023-07-17 ENCOUNTER — Encounter: Payer: Self-pay | Admitting: Cardiovascular Disease

## 2023-07-17 ENCOUNTER — Encounter: Payer: Self-pay | Admitting: Infectious Diseases

## 2023-07-17 VITALS — BP 100/61 | HR 77 | Resp 16 | Ht 72.0 in | Wt 234.0 lb

## 2023-07-17 DIAGNOSIS — F331 Major depressive disorder, recurrent, moderate: Secondary | ICD-10-CM | POA: Diagnosis present

## 2023-07-17 DIAGNOSIS — R7989 Other specified abnormal findings of blood chemistry: Secondary | ICD-10-CM | POA: Diagnosis not present

## 2023-07-17 DIAGNOSIS — I129 Hypertensive chronic kidney disease with stage 1 through stage 4 chronic kidney disease, or unspecified chronic kidney disease: Secondary | ICD-10-CM | POA: Diagnosis not present

## 2023-07-17 DIAGNOSIS — R531 Weakness: Secondary | ICD-10-CM | POA: Diagnosis not present

## 2023-07-17 DIAGNOSIS — E785 Hyperlipidemia, unspecified: Secondary | ICD-10-CM | POA: Diagnosis present

## 2023-07-17 DIAGNOSIS — S82891D Other fracture of right lower leg, subsequent encounter for closed fracture with routine healing: Secondary | ICD-10-CM | POA: Diagnosis not present

## 2023-07-17 DIAGNOSIS — R58 Hemorrhage, not elsewhere classified: Secondary | ICD-10-CM | POA: Diagnosis not present

## 2023-07-17 DIAGNOSIS — G4733 Obstructive sleep apnea (adult) (pediatric): Secondary | ICD-10-CM | POA: Diagnosis not present

## 2023-07-17 DIAGNOSIS — Z79899 Other long term (current) drug therapy: Secondary | ICD-10-CM

## 2023-07-17 DIAGNOSIS — E1169 Type 2 diabetes mellitus with other specified complication: Secondary | ICD-10-CM | POA: Diagnosis not present

## 2023-07-17 DIAGNOSIS — Z452 Encounter for adjustment and management of vascular access device: Secondary | ICD-10-CM | POA: Diagnosis not present

## 2023-07-17 DIAGNOSIS — E1143 Type 2 diabetes mellitus with diabetic autonomic (poly)neuropathy: Principal | ICD-10-CM | POA: Diagnosis present

## 2023-07-17 DIAGNOSIS — J449 Chronic obstructive pulmonary disease, unspecified: Secondary | ICD-10-CM | POA: Diagnosis not present

## 2023-07-17 DIAGNOSIS — E1140 Type 2 diabetes mellitus with diabetic neuropathy, unspecified: Secondary | ICD-10-CM | POA: Diagnosis not present

## 2023-07-17 DIAGNOSIS — Z87891 Personal history of nicotine dependence: Secondary | ICD-10-CM | POA: Diagnosis not present

## 2023-07-17 DIAGNOSIS — Z794 Long term (current) use of insulin: Secondary | ICD-10-CM | POA: Diagnosis not present

## 2023-07-17 DIAGNOSIS — S82851E Displaced trimalleolar fracture of right lower leg, subsequent encounter for open fracture type I or II with routine healing: Secondary | ICD-10-CM | POA: Diagnosis not present

## 2023-07-17 DIAGNOSIS — I10 Essential (primary) hypertension: Secondary | ICD-10-CM | POA: Diagnosis not present

## 2023-07-17 DIAGNOSIS — I951 Orthostatic hypotension: Secondary | ICD-10-CM | POA: Diagnosis not present

## 2023-07-17 DIAGNOSIS — I272 Pulmonary hypertension, unspecified: Secondary | ICD-10-CM | POA: Diagnosis not present

## 2023-07-17 DIAGNOSIS — G8929 Other chronic pain: Secondary | ICD-10-CM | POA: Diagnosis not present

## 2023-07-17 DIAGNOSIS — F419 Anxiety disorder, unspecified: Secondary | ICD-10-CM | POA: Diagnosis present

## 2023-07-17 DIAGNOSIS — L89150 Pressure ulcer of sacral region, unstageable: Secondary | ICD-10-CM | POA: Diagnosis present

## 2023-07-17 DIAGNOSIS — M86671 Other chronic osteomyelitis, right ankle and foot: Secondary | ICD-10-CM | POA: Diagnosis not present

## 2023-07-17 DIAGNOSIS — R404 Transient alteration of awareness: Secondary | ICD-10-CM | POA: Diagnosis not present

## 2023-07-17 DIAGNOSIS — E876 Hypokalemia: Secondary | ICD-10-CM | POA: Diagnosis present

## 2023-07-17 DIAGNOSIS — T847XXA Infection and inflammatory reaction due to other internal orthopedic prosthetic devices, implants and grafts, initial encounter: Secondary | ICD-10-CM | POA: Diagnosis not present

## 2023-07-17 DIAGNOSIS — T8469XA Infection and inflammatory reaction due to internal fixation device of other site, initial encounter: Secondary | ICD-10-CM | POA: Diagnosis not present

## 2023-07-17 DIAGNOSIS — Y831 Surgical operation with implant of artificial internal device as the cause of abnormal reaction of the patient, or of later complication, without mention of misadventure at the time of the procedure: Secondary | ICD-10-CM | POA: Diagnosis present

## 2023-07-17 DIAGNOSIS — I959 Hypotension, unspecified: Secondary | ICD-10-CM

## 2023-07-17 DIAGNOSIS — Z7985 Long-term (current) use of injectable non-insulin antidiabetic drugs: Secondary | ICD-10-CM

## 2023-07-17 DIAGNOSIS — E1122 Type 2 diabetes mellitus with diabetic chronic kidney disease: Secondary | ICD-10-CM | POA: Diagnosis not present

## 2023-07-17 DIAGNOSIS — R42 Dizziness and giddiness: Secondary | ICD-10-CM | POA: Diagnosis not present

## 2023-07-17 DIAGNOSIS — L89322 Pressure ulcer of left buttock, stage 2: Secondary | ICD-10-CM | POA: Diagnosis present

## 2023-07-17 DIAGNOSIS — Z7901 Long term (current) use of anticoagulants: Secondary | ICD-10-CM

## 2023-07-17 DIAGNOSIS — N184 Chronic kidney disease, stage 4 (severe): Secondary | ICD-10-CM | POA: Diagnosis present

## 2023-07-17 DIAGNOSIS — N400 Enlarged prostate without lower urinary tract symptoms: Secondary | ICD-10-CM | POA: Diagnosis present

## 2023-07-17 DIAGNOSIS — Z7902 Long term (current) use of antithrombotics/antiplatelets: Secondary | ICD-10-CM | POA: Diagnosis not present

## 2023-07-17 DIAGNOSIS — E039 Hypothyroidism, unspecified: Secondary | ICD-10-CM | POA: Diagnosis not present

## 2023-07-17 DIAGNOSIS — I2510 Atherosclerotic heart disease of native coronary artery without angina pectoris: Secondary | ICD-10-CM | POA: Diagnosis not present

## 2023-07-17 DIAGNOSIS — Z79890 Hormone replacement therapy (postmenopausal): Secondary | ICD-10-CM

## 2023-07-17 DIAGNOSIS — Z7409 Other reduced mobility: Secondary | ICD-10-CM | POA: Diagnosis not present

## 2023-07-17 DIAGNOSIS — Z955 Presence of coronary angioplasty implant and graft: Secondary | ICD-10-CM

## 2023-07-17 DIAGNOSIS — I4819 Other persistent atrial fibrillation: Secondary | ICD-10-CM | POA: Diagnosis not present

## 2023-07-17 DIAGNOSIS — Z981 Arthrodesis status: Secondary | ICD-10-CM

## 2023-07-17 LAB — COMPREHENSIVE METABOLIC PANEL WITH GFR
ALT: 16 U/L (ref 0–44)
AST: 39 U/L (ref 15–41)
Albumin: 2.2 g/dL — ABNORMAL LOW (ref 3.5–5.0)
Alkaline Phosphatase: 128 U/L — ABNORMAL HIGH (ref 38–126)
Anion gap: 13 (ref 5–15)
BUN: 25 mg/dL — ABNORMAL HIGH (ref 8–23)
CO2: 22 mmol/L (ref 22–32)
Calcium: 8.7 mg/dL — ABNORMAL LOW (ref 8.9–10.3)
Chloride: 103 mmol/L (ref 98–111)
Creatinine, Ser: 2.12 mg/dL — ABNORMAL HIGH (ref 0.61–1.24)
GFR, Estimated: 33 mL/min — ABNORMAL LOW (ref >60)
Glucose, Bld: 182 mg/dL — ABNORMAL HIGH (ref 70–99)
Potassium: 3.6 mmol/L (ref 3.5–5.1)
Sodium: 138 mmol/L (ref 135–145)
Total Bilirubin: 0.6 mg/dL (ref 0.0–1.2)
Total Protein: 6 g/dL — ABNORMAL LOW (ref 6.5–8.1)

## 2023-07-17 LAB — RENAL FUNCTION PANEL
Albumin: 2.1 g/dL — ABNORMAL LOW (ref 3.5–5.0)
Anion gap: 15 (ref 5–15)
BUN: 25 mg/dL — ABNORMAL HIGH (ref 8–23)
CO2: 21 mmol/L — ABNORMAL LOW (ref 22–32)
Calcium: 8.4 mg/dL — ABNORMAL LOW (ref 8.9–10.3)
Chloride: 104 mmol/L (ref 98–111)
Creatinine, Ser: 2.15 mg/dL — ABNORMAL HIGH (ref 0.61–1.24)
GFR, Estimated: 33 mL/min — ABNORMAL LOW (ref >60)
Glucose, Bld: 121 mg/dL — ABNORMAL HIGH (ref 70–99)
Phosphorus: 3.3 mg/dL (ref 2.5–4.6)
Potassium: 4 mmol/L (ref 3.5–5.1)
Sodium: 140 mmol/L (ref 135–145)

## 2023-07-17 LAB — I-STAT CG4 LACTIC ACID, ED
Lactic Acid, Venous: 1 mmol/L (ref 0.5–1.9)
Lactic Acid, Venous: 0.9 mmol/L (ref 0.5–1.9)

## 2023-07-17 LAB — MAGNESIUM: Magnesium: 1.9 mg/dL (ref 1.7–2.4)

## 2023-07-17 LAB — TSH: TSH: 6.242 u[IU]/mL — ABNORMAL HIGH (ref 0.350–4.500)

## 2023-07-17 LAB — GLUCOSE, CAPILLARY: Glucose-Capillary: 124 mg/dL — ABNORMAL HIGH (ref 70–99)

## 2023-07-17 LAB — CBC
HCT: 37.3 % — ABNORMAL LOW (ref 39.0–52.0)
HCT: 35.5 % — ABNORMAL LOW (ref 39.0–52.0)
Hemoglobin: 12.2 g/dL — ABNORMAL LOW (ref 13.0–17.0)
Hemoglobin: 11.6 g/dL — ABNORMAL LOW (ref 13.0–17.0)
MCH: 28.7 pg (ref 26.0–34.0)
MCH: 28.9 pg (ref 26.0–34.0)
MCHC: 32.7 g/dL (ref 30.0–36.0)
MCHC: 32.7 g/dL (ref 30.0–36.0)
MCV: 87.8 fL (ref 80.0–100.0)
MCV: 88.3 fL (ref 80.0–100.0)
Platelets: 280 10*3/uL (ref 150–400)
Platelets: 266 10*3/uL (ref 150–400)
RBC: 4.25 MIL/uL (ref 4.22–5.81)
RBC: 4.02 MIL/uL — ABNORMAL LOW (ref 4.22–5.81)
RDW: 15.9 % — ABNORMAL HIGH (ref 11.5–15.5)
RDW: 15.8 % — ABNORMAL HIGH (ref 11.5–15.5)
WBC: 6.8 10*3/uL (ref 4.0–10.5)
WBC: 7.8 10*3/uL (ref 4.0–10.5)
nRBC: 0 % (ref 0.0–0.2)
nRBC: 0 % (ref 0.0–0.2)

## 2023-07-17 LAB — D-DIMER, QUANTITATIVE: D-Dimer, Quant: 1.68 ug{FEU}/mL — ABNORMAL HIGH (ref 0.00–0.50)

## 2023-07-17 LAB — CK: Total CK: 437 U/L — ABNORMAL HIGH (ref 49–397)

## 2023-07-17 LAB — T4, FREE: Free T4: 0.83 ng/dL (ref 0.61–1.12)

## 2023-07-17 MED ORDER — FLUDROCORTISONE 0.1 MG/ML ORAL SUSPENSION
0.2000 mg | ORAL | Status: AC
  Administered 2023-07-17: 0.2 mg via ORAL
  Filled 2023-07-17: qty 2

## 2023-07-17 MED ORDER — TAMSULOSIN HCL 0.4 MG PO CAPS
0.4000 mg | ORAL_CAPSULE | Freq: Every day | ORAL | Status: DC
  Administered 2023-07-17 – 2023-07-18 (×2): 0.4 mg via ORAL
  Filled 2023-07-17 (×2): qty 1

## 2023-07-17 MED ORDER — DIPHENHYDRAMINE HCL 25 MG PO CAPS
25.0000 mg | ORAL_CAPSULE | Freq: Once | ORAL | Status: AC
  Administered 2023-07-17: 25 mg via ORAL
  Filled 2023-07-17: qty 1

## 2023-07-17 MED ORDER — SODIUM CHLORIDE 0.9 % IV SOLN
1.0000 g | INTRAVENOUS | Status: DC
  Administered 2023-07-17 – 2023-07-21 (×5): 1 g via INTRAVENOUS
  Filled 2023-07-17 (×7): qty 1000

## 2023-07-17 MED ORDER — ALTEPLASE 2 MG IJ SOLR: 2.0000 mg | Freq: Once | INTRAMUSCULAR | Status: DC | PRN

## 2023-07-17 MED ORDER — SODIUM CHLORIDE 0.9% FLUSH
3.0000 mL | Freq: Two times a day (BID) | INTRAVENOUS | Status: DC
  Administered 2023-07-17 – 2023-07-18 (×2): 10 mL via INTRAVENOUS
  Administered 2023-07-18 – 2023-07-20 (×3): 3 mL via INTRAVENOUS
  Administered 2023-07-21: 10 mL via INTRAVENOUS

## 2023-07-17 MED ORDER — INSULIN ASPART 100 UNIT/ML IJ SOLN
0.0000 [IU] | Freq: Three times a day (TID) | INTRAMUSCULAR | Status: DC
  Administered 2023-07-19 – 2023-07-20 (×2): 1 [IU] via SUBCUTANEOUS
  Administered 2023-07-21: 2 [IU] via SUBCUTANEOUS

## 2023-07-17 MED ORDER — AMIODARONE HCL 200 MG PO TABS
200.0000 mg | ORAL_TABLET | Freq: Two times a day (BID) | ORAL | Status: DC
  Administered 2023-07-17 – 2023-07-22 (×10): 200 mg via ORAL
  Filled 2023-07-17 (×10): qty 1

## 2023-07-17 MED ORDER — LEVOTHYROXINE SODIUM 88 MCG PO TABS
88.0000 ug | ORAL_TABLET | Freq: Every day | ORAL | Status: DC
  Administered 2023-07-18 – 2023-07-22 (×5): 88 ug via ORAL
  Filled 2023-07-17 (×5): qty 1

## 2023-07-17 MED ORDER — EZETIMIBE 10 MG PO TABS
10.0000 mg | ORAL_TABLET | Freq: Every day | ORAL | Status: DC
  Administered 2023-07-17 – 2023-07-21 (×5): 10 mg via ORAL
  Filled 2023-07-17 (×5): qty 1

## 2023-07-17 MED ORDER — SODIUM CHLORIDE 0.9 % IV SOLN
8.0000 mg/kg | Freq: Every day | INTRAVENOUS | Status: DC
  Administered 2023-07-18 – 2023-07-21 (×4): 800 mg via INTRAVENOUS
  Filled 2023-07-17 (×6): qty 16

## 2023-07-17 MED ORDER — APIXABAN 5 MG PO TABS
5.0000 mg | ORAL_TABLET | Freq: Two times a day (BID) | ORAL | Status: DC
  Administered 2023-07-17 – 2023-07-22 (×10): 5 mg via ORAL
  Filled 2023-07-17 (×10): qty 1

## 2023-07-17 MED ORDER — DULOXETINE HCL 20 MG PO CPEP
40.0000 mg | ORAL_CAPSULE | Freq: Every day | ORAL | Status: DC
  Administered 2023-07-18 – 2023-07-19 (×2): 40 mg via ORAL
  Filled 2023-07-17 (×2): qty 2

## 2023-07-17 MED ORDER — LIDOCAINE-PRILOCAINE 2.5-2.5 % EX CREA: 1.0000 | TOPICAL_CREAM | CUTANEOUS | Status: DC | PRN

## 2023-07-17 MED ORDER — SODIUM CHLORIDE 0.9% FLUSH: 3.0000 mL | INTRAVENOUS | Status: DC | PRN

## 2023-07-17 MED ORDER — SODIUM CHLORIDE 0.9% FLUSH
3.0000 mL | Freq: Two times a day (BID) | INTRAVENOUS | Status: DC
  Administered 2023-07-17 – 2023-07-22 (×10): 3 mL via INTRAVENOUS

## 2023-07-17 MED ORDER — INSULIN ASPART 100 UNIT/ML IJ SOLN: 0.0000 [IU] | Freq: Every day | INTRAMUSCULAR | Status: DC

## 2023-07-17 MED ORDER — HEPARIN SODIUM (PORCINE) 1000 UNIT/ML DIALYSIS: 1000.0000 [IU] | INTRAMUSCULAR | Status: DC | PRN

## 2023-07-17 MED ORDER — ATORVASTATIN CALCIUM 40 MG PO TABS
40.0000 mg | ORAL_TABLET | Freq: Every day | ORAL | Status: DC
  Administered 2023-07-17 – 2023-07-21 (×5): 40 mg via ORAL
  Filled 2023-07-17 (×5): qty 1

## 2023-07-17 MED ORDER — ACETAMINOPHEN 650 MG RE SUPP: 650.0000 mg | Freq: Four times a day (QID) | RECTAL | Status: DC | PRN

## 2023-07-17 MED ORDER — ANTICOAGULANT SODIUM CITRATE 4% (200MG/5ML) IV SOLN: 5.0000 mL | Status: DC | PRN

## 2023-07-17 MED ORDER — DAPTOMYCIN IV (FOR PTA / DISCHARGE USE ONLY): 800.0000 mg | INTRAVENOUS | Status: DC

## 2023-07-17 MED ORDER — LIDOCAINE HCL (PF) 1 % IJ SOLN
5.0000 mL | INTRAMUSCULAR | Status: DC | PRN
Start: 2023-07-17 — End: 2023-07-22

## 2023-07-17 MED ORDER — PENTAFLUOROPROP-TETRAFLUOROETH EX AERO: 1.0000 | INHALATION_SPRAY | CUTANEOUS | Status: DC | PRN

## 2023-07-17 MED ORDER — MIDODRINE HCL 5 MG PO TABS
10.0000 mg | ORAL_TABLET | Freq: Three times a day (TID) | ORAL | Status: DC
  Administered 2023-07-17 – 2023-07-20 (×9): 10 mg via ORAL
  Filled 2023-07-17 (×10): qty 2

## 2023-07-17 MED ORDER — CLOPIDOGREL BISULFATE 75 MG PO TABS
75.0000 mg | ORAL_TABLET | Freq: Every morning | ORAL | Status: DC
  Administered 2023-07-18 – 2023-07-22 (×5): 75 mg via ORAL
  Filled 2023-07-17 (×5): qty 1

## 2023-07-17 MED ORDER — ACETAMINOPHEN 325 MG PO TABS
650.0000 mg | ORAL_TABLET | Freq: Four times a day (QID) | ORAL | Status: DC | PRN
Start: 2023-07-17 — End: 2023-07-22

## 2023-07-17 NOTE — ED Provider Notes (Addendum)
 [place] EMERGENCY DEPARTMENT AT [facility] Provider Note   CSN: [id number] Arrival date & time: 07/17/23  1145     History  Chief Complaint  Patient presents with   Hypotension    Harold [name] is a 68 y.o. male.  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  He was seen in clinic today.  Then became orthostatic blood pressure 80/50 and lightheaded but did not have syncopal episode.  Symptoms have improved now.  He reports taking his home medications.  He denies any chest pain or shortness of breath or fever.     Home Medications Prior to Admission medications   Medication Sig Start Date End Date Taking? Authorizing Provider  acetaminophen  (TYLENOL ) 500 MG tablet Take 1 tablet (500 mg total) by mouth every 6 (six) hours as needed for mild pain (pain score 1-3). 01/07/23   Hongalgi, Anand D, MD  allopurinol  (ZYLOPRIM ) 100 MG tablet Take 0.5 tablets (50 mg total) by mouth daily. 05/10/23   Modena Andes, MD  amiodarone  (PACERONE ) 200 MG tablet Take 1 tablet (200 mg total) by mouth 2 (two) times daily. 05/10/23   Modena Andes, MD  apixaban  (ELIQUIS ) 5 MG TABS tablet TAKE 1 TABLET BY MOUTH TWICE A DAY 02/17/23   Arleen Lacer, MD  atorvastatin  (LIPITOR) 40 MG tablet Take 1 tablet (40 mg total) by mouth daily. Patient taking differently: Take 40 mg by mouth at bedtime. 05/10/23 06/21/23  Modena Andes, MD  clopidogrel  (PLAVIX ) 75 MG tablet Take 75 mg by mouth every morning. 02/04/23   [provider]  Continuous Glucose Sensor (FREESTYLE LIBRE 3 PLUS SENSOR) MISC Inject 1 Device into the skin every 14 (fourteen) days. 06/11/23   [provider]  daptomycin  (CUBICIN ) IVPB Inject 800 mg into the vein daily. Indication:  Hardware associated ankle osteomyelitis  First Dose: Yes Last Day of Therapy:  08/02/23 Labs - Once weekly:   CBC/D, BMP, and CPK Labs - Once weekly: ESR and CRP 06/28/23 08/07/23  Doroteo Gasmen, MD  docusate sodium  (COLACE) 100 MG capsule Take 1 capsule (100 mg total) by mouth 2 (two) times daily. 06/28/23   Doroteo Gasmen, MD  DULoxetine  (CYMBALTA ) 60 MG capsule Take 60 mg by mouth 2 (two) times daily.    [provider]  ertapenem  (INVANZ ) IVPB Inject 1 g into the vein daily. Indication:  Hardware associated ankle osteomyelitis  First Dose: Yes Last Day of Therapy:  08/02/23 Labs - Once weekly:  CBC/D and BMP, Labs - Once weekly: ESR and CRP 06/28/23 08/07/23  Doroteo Gasmen, MD  ezetimibe  (ZETIA ) 10 MG tablet Take 10 mg by mouth at bedtime. 05/09/22   [provider]  ferrous sulfate  325 (65 FE) MG tablet Take 325 mg by mouth 3 (three) times a week. Monday, Wednesday, Friday 06/08/23   [provider]  fludrocortisone (FLORINEF) 0.1 MG tablet Take 1 tablet (0.1 mg total) by mouth daily. 07/15/23   Wenona Hamilton, MD  furosemide  (LASIX ) 80 MG tablet Take 0.5 tablets (40 mg total) by mouth daily. May take additional tablet for weight gain of 3 pounds in one day or five pounds in one week 01/14/23   [place], Rema Care, DO  gabapentin  (NEURONTIN ) 300 MG capsule Take 1 capsule (300 mg total) by mouth at bedtime. 05/10/23   Modena Andes, MD  insulin  aspart (NOVOLOG ) 100 UNIT/ML  injection Inject 0-15 Units into the skin 3 (three) times daily with meals. 06/28/23   Doroteo Gasmen, MD  insulin  aspart (NOVOLOG ) 100 UNIT/ML injection Inject 0-5 Units into the skin at bedtime. 06/28/23   Doroteo Gasmen, MD  insulin  glargine-yfgn (SEMGLEE ) 100 UNIT/ML injection Inject 0.15 mLs (15 Units total) into the skin at bedtime. 06/28/23   Doroteo Gasmen, MD  leptospermum manuka honey (MEDIHONEY) PSTE paste Apply 1 Application topically daily. 06/29/23   Doroteo Gasmen, MD  levothyroxine  (SYNTHROID , LEVOTHROID) 88 MCG tablet Take 88 mcg by mouth daily before breakfast.     [provider]  magnesium  oxide (MAG-OX) 400 MG tablet Take 1 tablet by mouth daily. 06/08/23   [provider]  methocarbamol  (ROBAXIN ) 500 MG tablet Take 1 tablet (500 mg total) by mouth every 6 (six) hours as needed for muscle spasms. 05/20/23   Versie Gores, PA-C  midodrine  (PROAMATINE ) 10 MG tablet Take 1 tablet (10 mg total) by mouth 3 (three) times daily with meals. Take one hour prior to physical therapy 07/15/23   Wenona Hamilton, MD  Multiple Vitamin (MULTIVITAMIN WITH MINERALS) TABS tablet Take 1 tablet by mouth daily. 01/08/23   Hongalgi, Anand D, MD  nitroGLYCERIN  (NITROSTAT ) 0.4 MG SL tablet PLACE 1 TAB UNDER THE TONGUE EVERY 5 MINUTES AS NEEDED FOR CHEST PAIN (SEVERE PRESSURE OR TIGHTNESS) Patient taking differently: Place 0.4 mg under the tongue every 5 (five) minutes as needed for chest pain. PLACE 1 TAB UNDER THE TONGUE EVERY 5 MINUTES AS NEEDED FOR CHEST PAIN (SEVERE PRESSURE OR TIGHTNESS) 02/22/19   Arleen Lacer, MD  nutrition supplement, JUVEN, (JUVEN) PACK Take 1 packet by mouth 2 (two) times daily between meals. 06/28/23   Doroteo Gasmen, MD  Nystatin (GERHARDT'S BUTT CREAM) CREA Apply 1 Application topically 2 (two) times daily. 06/28/23   Doroteo Gasmen, MD  oxyCODONE  (OXY IR/ROXICODONE ) 5 MG immediate release tablet Take 1 tablet (5 mg total) by mouth every 4 (four) hours as needed for severe pain (pain score 7-10). 06/28/23   Fonnie Iba I, MD  pantoprazole  (PROTONIX ) 40 MG tablet TAKE 1 TABLET BY MOUTH EVERY DAY 12/13/22   Dunn, Dayna N, PA-C  polyethylene glycol (MIRALAX  / GLYCOLAX ) 17 g packet Take 17 g by mouth daily as needed for mild constipation. 06/28/23   Fonnie Iba I, MD  tamsulosin  (FLOMAX ) 0.4 MG CAPS capsule Take 0.4 mg by mouth daily. 06/16/23   [provider]  tirzepatide  (MOUNJARO ) 15 MG/0.5ML Pen Inject 15 mg into the skin once a week. Patient taking differently: Inject 15 mg into the skin once a week. Sunday  12/30/22     Vitamin D , Ergocalciferol , (DRISDOL ) 1.25 MG (50000 UNIT) CAPS capsule Take 1 capsule (50,000 Units total) by mouth every 7 (seven) days. Patient taking differently: Take 50,000 Units by mouth every 7 (seven) days. Sundays 05/27/23   Haydee Lipa, MD      Allergies    Beta-lactamase inhibitors (beta-lactam), Metformin  and related, Other, Sulfa antibiotics, Wellbutrin [bupropion], and Tetracyclines & related    Review of Systems   Review of Systems  Physical Exam Updated Vital Signs BP (!) 169/75   Pulse 65   Temp 97.8 F (36.6 C) (Axillary)   Resp 15   Ht 1.829 m (6')   Wt 106 kg   SpO2 100%   BMI 31.69 kg/m  Physical Exam Vitals and nursing note reviewed.  Constitutional:      Appearance: Normal  appearance.  HENT:     Head: Normocephalic.     Right Ear: External ear normal.     Left Ear: External ear normal.     Nose: Nose normal.     Mouth/Throat:     Pharynx: Oropharynx is clear.  Eyes:     Extraocular Movements: Extraocular movements intact.     Pupils: Pupils are equal, round, and reactive to light.  Cardiovascular:     Rate and Rhythm: Normal rate and regular rhythm.  Musculoskeletal:     Cervical back: Normal range of motion.  Neurological:     Mental Status: He is alert.     ED Results / Procedures / Treatments   Labs (all labs ordered are listed, but only abnormal results are displayed) Labs Reviewed  CBC - Abnormal; Notable for the following components:      Result Value   Hemoglobin 12.2 (*)    HCT 37.3 (*)    RDW 15.9 (*)    All other components within normal limits  COMPREHENSIVE METABOLIC PANEL WITH GFR - Abnormal; Notable for the following components:   Glucose, Bld 182 (*)    BUN 25 (*)    Creatinine, Ser 2.12 (*)    Calcium  8.7 (*)    Total Protein 6.0 (*)    Albumin  2.2 (*)    Alkaline Phosphatase 128 (*)    GFR, Estimated 33 (*)    All other components within normal limits  CULTURE, BLOOD (ROUTINE X 2)  CULTURE,  BLOOD (ROUTINE X 2)  I-STAT CG4 LACTIC ACID, ED  I-STAT CG4 LACTIC ACID, ED    EKG EKG Interpretation Date/Time:  Thursday Jul 17 2023 13:07:39 EDT Ventricular Rate:  65 PR Interval:  184 QRS Duration:  135 QT Interval:  519 QTC Calculation: 540 R Axis:   54  Text Interpretation: Sinus rhythm Nonspecific intraventricular conduction delay Confirmed by Auston Blush [phone]) on 07/17/2023 3:34:28 PM  Radiology DG Chest Port 1 View Result Date: 07/17/2023 CLINICAL DATA:  Weakness. EXAM: PORTABLE CHEST 1 VIEW COMPARISON:  05/26/2023 FINDINGS: Normal sized heart with an interval decrease in size. Clear lungs with normal vascularity. Thoracic spine degenerative changes. IMPRESSION: No acute abnormality. Electronically Signed   By: Catherin Closs M.D.   On: 07/17/2023 14:40    Procedures .Critical Care  Performed by: Auston Blush, MD Authorized by: Auston Blush, MD   Critical care provider statement:    Critical care time (minutes):  60   Critical care time was exclusive of:  Separately billable procedures and treating other patients and teaching time   Critical care was necessary to treat or prevent imminent or life-threatening deterioration of the following conditions:  Circulatory failure   Critical care was time spent personally by me on the following activities:  Development of treatment plan with patient or surrogate, discussions with consultants, evaluation of patient's response to treatment, examination of patient, ordering and review of laboratory studies, ordering and review of radiographic studies, ordering and performing treatments and interventions, pulse oximetry, re-evaluation of patient's condition and review of old charts     Medications Ordered in ED Medications  fludrocortisone (FLORINEF) 0.1mg /mL oral suspension 0.2 mg (has no administration in time range)  midodrine  (PROAMATINE ) tablet 10 mg (has no administration in time range)    ED Course/ Medical Decision  Making/ A&P Clinical Course as of 07/17/23 1549  Thu Jul 17, 2023  1356 CBC reviewed interpreted and within normal limits [DR]  1442 Chest x-Kniyah Khun reviewed interpreted no evidence  of acute abnormalities noted on my interpretation awaiting radiologist interpretation [DR]    Clinical Course User Index [DR] Auston Blush, MD                                 Medical Decision Making Amount and/or Complexity of Data Reviewed Labs: ordered. Radiology: ordered.  Risk Prescription drug management. Decision regarding hospitalization.   68 year old male history of coronary artery disease, A-fib on Eliquis , osteomyelitis, orthostatic hypotension, presents today from infectious disease clinic with symptomatic hypotension blood pressure 80/50.  Laying down and has improved greatly. Frontal diagnosis includes but is not limited to orthostatic hypotension, sepsis, heart failure, anemia Doubt sepsis as patient being actively treated for infection and ID feels that wound is better.  He does not have a leukocytosis, fever, and lactic acid is normal. Patient's hemoglobin is stable at 12 doubt any source of bleeding to explain hypotension Complete metabolic panel is reviewed and patient's creatinine is increased to 0.12 from 1.5 at discharge.  Review of records note the patient has been on Florinef once a day and midodrine  3 times daily.  He is also on Lasix  up to 80 mg a day for weight gain.  Is unclear whether or not he has been taking the Lasix .  Discussed care with cardiology, Dr. Stann Earnest, who advised increasing his Florinef to twice daily, stopping his Lasix , and hydration. Patient does not have pain or dyspnea, EKG without ischemic changes. Discussed with Dr. Lydia Sams, on-call for hospitalist who will admit for ongoing evaluation and treatment        Final Clinical Impression(s) / ED Diagnoses Final diagnoses:  Hypotension, unspecified hypotension type    Rx / DC Orders ED Discharge Orders      None         Auston Blush, MD 07/17/23 1549    Auston Blush, MD 07/17/23 [phone]

## 2023-07-17 NOTE — Progress Notes (Signed)
 Pharmacy Antibiotic Note  Harold [name] is a 68 y.o. male for which pharmacy has been consulted for daptomycin  and ertapenem  dosing for continuing patient's OPAT IV abx for Hardware associated ankle osteomyelitis Last Day of Therapy:  08/02/23.  Patient with a history of CAD s/p PCI, CKD, COPD, PAH, OSA, DM, venous insufficiency, orthostatic hypotension. Patient followed by ID for multiple surgeries and infection with Enterobacter cloacae bacteremia and recent right ankle arthrodesis on 05/15/2023.  Patient states has not had IV abx today and normally takes in the evening hours.  SCr 2.12 - at baseline WBC 6.8; LA 0.9; T 97.7; HR 61; RR 10  Plan: Continue ertapenem  1g q24h and daptomycin  800 mg q24h Will get CK Monitor WBC, fever, renal function, cultures De-escalate when able F/u ID recommendations  Height: 6' (182.9 cm) Weight: 106 kg (233 lb 11 oz) IBW/kg (Calculated) : 77.6  Temp (24hrs), Avg:97.8 F (36.6 C), Min:97.7 F (36.5 C), Max:97.8 F (36.6 C)  Recent Labs  Lab 07/17/23 1238 07/17/23 1308 07/17/23 1450  WBC 6.8  --   --   CREATININE 2.12*  --   --   LATICACIDVEN  --  1.0 0.9    Estimated Creatinine Clearance: 41.4 mL/min (A) (by C-G formula based on SCr of 2.12 mg/dL (H)).    Allergies  Allergen Reactions   Beta-Lactamase Inhibitors (Beta-Lactam) Hives, Itching and Rash    AX - penicillin     Ancef  given 9/24 with no obvious reaction   Metformin  And Related Diarrhea and Nausea Only   Other Nausea And Vomiting    General Anesthesia - sometimes causes nausea and vomiting * OK to use scopolamine  patch     Sulfa Antibiotics Hives and Other (See Comments)    Bactrim   Wellbutrin [Bupropion] Other (See Comments)    Mood Changes   Tetracyclines & Related Hives and Rash    doxycycline    Microbiology results: Pending  Thank you for allowing pharmacy to be a part of this patient's care.  Dionicio Fray, PharmD, BCPS 07/17/2023 4:35 PM ED Clinical  Pharmacist -  [phone]

## 2023-07-17 NOTE — ED Triage Notes (Signed)
 Pt to ED with c/o hypotension earlier today. Pt was at a medical facility where they checked his BP and found it to be 80s/50s. Pt reports dizziness at that time. Pt has hx hypotension and is currently taking medication for this. Pt VSS. Pt denies dizziness currently. Pt A&Ox4.

## 2023-07-17 NOTE — Telephone Encounter (Signed)
 EMS called to transport patient to [facility]. Resides in SNF and complains of dizziness with weeks of Hypotension. States he has passed out a few times in past 2 weeks. Cardiac hx of AFIB and Stents. Notified facility and family that he has been transported by EMS to [facility].   [phone]-Notified by [employer] in room [employer] [phone] notified by [employer] by [employer]

## 2023-07-17 NOTE — Telephone Encounter (Signed)
 Updated facility regarding pt transport to ED. Also updated [facility] with Transportation. Moishe Angel will ask facility how they would like to coordinate picking up wheelchair at office and call back.  Spoke with spouse who states he is currently out of town. Is worried on how to coordinate transportation from ED to facility.  Informed him that someone from inpatient would contact him if needed. [name] did want to inform provider that patient has history of low blood pressure. Is working closely with cardiology who recently changed his medication.   Julien Odor, RMA

## 2023-07-17 NOTE — Progress Notes (Unsigned)
 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, right (HCC) 03/28/2023   Abscess of skin of right ankle 03/28/2023   PAD (peripheral artery disease) (HCC) 03/28/2023   Vitamin D  deficiency 01/07/2023   Prolonged QT interval 01/02/2023   Acute kidney injury superimposed on stage 4 chronic kidney disease (HCC) 01/02/2023   Normocytic anemia 01/02/2023   Paroxysmal atrial fibrillation (HCC) 01/02/2023   Uncontrolled type 2 diabetes mellitus with hyperglycemia, with long-term current use of insulin  (HCC) 01/02/2023   Acquired hypothyroidism 01/02/2023   CKD stage 3b, GFR 30-44 ml/min (HCC) 11/07/2022   CAD in native artery 11/07/2022   PVD (peripheral vascular disease) (HCC) 11/06/2022   Hypercoagulable state due to persistent atrial fibrillation (HCC) 02/19/2022   Persistent atrial fibrillation (HCC) 01/28/2022   Lumbar stenosis with neurogenic claudication 07/26/2020   Diabetic neuropathy (HCC) 08/24/2019   Trigger thumb of left hand 09/02/2018   Atherosclerosis of native artery of both lower extremities with intermittent claudication (HCC) 05/12/2018   Pain of left hand 03/26/2018   Trigger finger 03/03/2017   Pain in finger of left hand 03/03/2017   Snoring 08/20/2016   Morbid (severe) obesity due to excess calories (HCC) 04/18/2016   Venous stasis of both lower extremities - with edema 02/25/2015   Right-sided chest wall pain 05/08/2014   Gastroesophageal reflux disease without esophagitis 10/10/2013   Chronic heart failure with preserved  ejection fraction (HFpEF) (HCC) 06/27/2013   COPD (chronic obstructive pulmonary disease) (HCC) 06/17/2013   Restrictive lung disease 06/17/2013   Obesity, Class III, BMI 40-49.9 (morbid obesity) 09/01/2012   Generalized weakness 09/01/2012   Chronic renal disease, stage IV (HCC) 04/30/2012   OSA on CPAP 04/29/2012   Pulmonary hypertension, 04/29/2012   Dyspnea on exertion   04/26/2012   CAD, LAD/CFX DES 04/28/12- staged RCA DES 04/29/12 after pt declined CABG, cath 05/14/13 stable CAD medical therapy    Carotid artery stenosis without cerebral infarction, bilateral 11/07/2011   Diabetes mellitus type 2 with neurological manifestations (HCC) 04/29/2011   Essential hypertension 04/29/2011   Neuropathy 04/29/2011   Hyperlipidemia associated with type 2 diabetes mellitus (HCC) 04/29/2011   Anxiety and depression 04/29/2011    Patient's Medications  New Prescriptions   No medications on file  Previous Medications   ACETAMINOPHEN  (TYLENOL ) 500 MG TABLET    Take 1 tablet (500 mg total) by mouth every 6 (six) hours as needed for mild pain (pain score 1-3).   ALLOPURINOL  (ZYLOPRIM ) 100 MG TABLET    Take 0.5 tablets (50 mg total) by mouth daily.   AMIODARONE  (PACERONE ) 200 MG TABLET    Take 1 tablet (200 mg total) by mouth 2 (two) times daily.   APIXABAN  (ELIQUIS ) 5 MG TABS TABLET    TAKE 1 TABLET BY MOUTH TWICE A DAY   ATORVASTATIN  (LIPITOR) 40 MG TABLET    Take 1 tablet (40 mg total) by mouth daily.   CLOPIDOGREL  (PLAVIX ) 75 MG TABLET    Take 75 mg by mouth every morning.   CONTINUOUS GLUCOSE SENSOR (FREESTYLE LIBRE 3 PLUS SENSOR) MISC  Inject 1 Device into the skin every 14 (fourteen) days.   DAPTOMYCIN  (CUBICIN ) IVPB    Inject 800 mg into the vein daily. Indication:  Hardware associated ankle osteomyelitis  First Dose: Yes Last Day of Therapy:  08/02/23 Labs - Once weekly:  CBC/D, BMP, and CPK Labs - Once weekly: ESR and CRP   DOCUSATE SODIUM  (COLACE) 100 MG CAPSULE    Take 1  capsule (100 mg total) by mouth 2 (two) times daily.   DULOXETINE  (CYMBALTA ) 60 MG CAPSULE    Take 60 mg by mouth 2 (two) times daily.   ERTAPENEM  (INVANZ ) IVPB    Inject 1 g into the vein daily. Indication:  Hardware associated ankle osteomyelitis  First Dose: Yes Last Day of Therapy:  08/02/23 Labs - Once weekly:  CBC/D and BMP, Labs - Once weekly: ESR and CRP   EZETIMIBE  (ZETIA ) 10 MG TABLET    Take 10 mg by mouth at bedtime.   FERROUS SULFATE  325 (65 FE) MG TABLET    Take 325 mg by mouth 3 (three) times a week. Monday, Wednesday, Friday   FLUDROCORTISONE (FLORINEF) 0.1 MG TABLET    Take 1 tablet (0.1 mg total) by mouth daily.   FUROSEMIDE  (LASIX ) 80 MG TABLET    Take 0.5 tablets (40 mg total) by mouth daily. May take additional tablet for weight gain of 3 pounds in one day or five pounds in one week   GABAPENTIN  (NEURONTIN ) 300 MG CAPSULE    Take 1 capsule (300 mg total) by mouth at bedtime.   INSULIN  ASPART (NOVOLOG ) 100 UNIT/ML INJECTION    Inject 0-15 Units into the skin 3 (three) times daily with meals.   INSULIN  ASPART (NOVOLOG ) 100 UNIT/ML INJECTION    Inject 0-5 Units into the skin at bedtime.   INSULIN  GLARGINE-YFGN (SEMGLEE ) 100 UNIT/ML INJECTION    Inject 0.15 mLs (15 Units total) into the skin at bedtime.   LEPTOSPERMUM MANUKA HONEY (MEDIHONEY) PSTE PASTE    Apply 1 Application topically daily.   LEVOTHYROXINE  (SYNTHROID , LEVOTHROID) 88 MCG TABLET    Take 88 mcg by mouth daily before breakfast.   MAGNESIUM  OXIDE (MAG-OX) 400 MG TABLET    Take 1 tablet by mouth daily.   METHOCARBAMOL  (ROBAXIN ) 500 MG TABLET    Take 1 tablet (500 mg total) by mouth every 6 (six) hours as needed for muscle spasms.   MIDODRINE  (PROAMATINE ) 10 MG TABLET    Take 1 tablet (10 mg total) by mouth 3 (three) times daily with meals. Take one hour prior to physical therapy   MULTIPLE VITAMIN (MULTIVITAMIN WITH MINERALS) TABS TABLET    Take 1 tablet by mouth daily.   NITROGLYCERIN  (NITROSTAT ) 0.4 MG SL TABLET     PLACE 1 TAB UNDER THE TONGUE EVERY 5 MINUTES AS NEEDED FOR CHEST PAIN (SEVERE PRESSURE OR TIGHTNESS)   NUTRITION SUPPLEMENT, JUVEN, (JUVEN) PACK    Take 1 packet by mouth 2 (two) times daily between meals.   NYSTATIN (GERHARDT'S BUTT CREAM) CREA    Apply 1 Application topically 2 (two) times daily.   OXYCODONE  (OXY IR/ROXICODONE ) 5 MG IMMEDIATE RELEASE TABLET    Take 1 tablet (5 mg total) by mouth every 4 (four) hours as needed for severe pain (pain score 7-10).   PANTOPRAZOLE  (PROTONIX ) 40 MG TABLET    TAKE 1 TABLET BY MOUTH EVERY DAY   POLYETHYLENE GLYCOL (MIRALAX  / GLYCOLAX ) 17 G PACKET    Take 17 g by mouth daily as needed for mild constipation.  TAMSULOSIN  (FLOMAX ) 0.4 MG CAPS CAPSULE    Take 0.4 mg by mouth daily.   TIRZEPATIDE  (MOUNJARO ) 15 MG/0.5ML PEN    Inject 15 mg into the skin once a week.   VITAMIN D , ERGOCALCIFEROL , (DRISDOL ) 1.25 MG (50000 UNIT) CAPS CAPSULE    Take 1 capsule (50,000 Units total) by mouth every 7 (seven) days.  Modified Medications   No medications on file  Discontinued Medications   No medications on file    Subjective: 68 Y O male with h/o CKD, qtc prolongation, morbid obesity, OSA, COPD, DM2 with neuropathy, CAD s/p CABG, HLD A fib on AC, Hypothyroidism, Pul HTN, CHF, GERD, Carotid artery stenosis, PVD, Venous stasis of lower extremities, prior Rt lower extremity ORIF associated infection s/p I and D on 03/31/23 and 04/02/23 with all hardware out ( cx MRSE and enterobacter claoacae)  placed on dapto/cefepime  but stopped on 2/28 due to AKI on CKD concerning for AIN requiring iHD discharged on 3/22 but re-admitted 3/24 after a ground level fall resulting in rt open ankle # through the area of previous infection s/p initial ex fix, closed reduction, I and D on 3/24 followed by Removal of external fixator,ORIF on 3/27 with course complicated by enterobacter bacteremia who is here for follow up. Patient had HDC remove don 3/30, Repeat blood cx 3/31 NG. No need for HD on  discharge. Patient was discharged to home with [facility] with plan to complete 4 weeks of ciprofloxscin through 4/28 with close qtc monitoring    4/29 Unable to come to clinic in person and hence, visit switched to video. Husband reports ciprofloxacin  was completed 3 days ago. Sutures out yesterday. 2 sores developed in rt ankle  that developed a week ago, recently draining clear stuff. No fevers, chills, nausea, vomiting. Unable to be seen at Dr Curtiss Dowdy office   and had portable xray done at home,  no results yet. He is unable to walk and even difficult to transfer from bed to commode. BP goes down when standing as well as seizures which they were able to control but does not want to go to ED. Seen in the ED 4/7 for SOB while doing PT with unremarkable work up. Labs were drawn 2 hrs ago.  5/29 Patient was recently admitted 5/1-5/10 due to exposed hardware in rt ankle. He underwent  removal of rt ankle hardware by Dr Curtiss Dowdy on 06/21/23. Or cx not sent. Seen by ID and recommended 6 weeks of IV daptomycin  and ertapenem  via PICC.   Patient here from facility and is in a wheelchair. Staff took vitals on arrival with repeated BP in the range of 80s/50s. He also complained of dizziness during evaluation and reported passing out last week. He is getting IV antibiotics from rt arm PICC without concerns. Denies fevers, chills, nausea, vomiting or diarrhea.   Review of Systems: all systems reviewed with pertinent positives and negatives as listed above   Past Medical History:  Diagnosis Date   Anemia    Anginal pain (HCC) 04/2013   Cardiac cath showed patent stents with distal LAD, circumflex-OM and RCA disease in small vessels.   Anxiety    Atrial fibrillation (HCC) 12/2021   CAD S/P percutaneous coronary angioplasty 2003, 04/2012   status post PCI to LAD, circumflex-OM 2, RCA   Carotid artery occlusion    Chronic renal insufficiency, stage II (mild)    Chronic venous insufficiency    varicosities, no reflux;  dopplers 04/14/12- valvular insufficiency in the R and L  GSV   Complication of anesthesia    COPD, mild (HCC)    Depression    situaltional    Diabetes mellitus    Diabetic neuropathy (HCC) 08/24/2019   Dyslipidemia associated with type 2 diabetes mellitus (HCC)    Fall at home, initial encounter 01/02/2023   GERD (gastroesophageal reflux disease)    History of cardioversion 12/2021   at [facility]   HTN (hypertension)    Hypothyroidism    Neuromuscular disorder (HCC)    neuropathy in feet   Neuropathy    notably improved following PCI with improved cardiac function   Obesity    Obesity, Class II, BMI 35.0-39.9, with comorbidity (see actual BMI)    BMI 39; wgt loss efforts in place; seeing Dietician   Open ankle fracture 01/02/2023   Orthostatic hypotension    PONV (postoperative nausea and vomiting)    "[employer]"   Pulmonary hypertension (HCC) 04/2012   PA pressure   Sleep apnea    uses nightly   Vitamin D  deficiency    per facility notes   Past Surgical History:  Procedure Laterality Date   ABDOMINAL AORTAGRAM N/A 11/15/2011   Procedure: ABDOMINAL Tommi Fraise;  Surgeon: Richrd Char, MD;  Location: [facility] CATH LAB;  Service: Cardiovascular;  [name];   ABDOMINAL AORTOGRAM W/LOWER EXTREMITY N/A 10/13/2019   Procedure: ABDOMINAL AORTOGRAM W/LOWER EXTREMITY;  Surgeon: Wenona Hamilton, MD;  Location: [facility] INVASIVE CV LAB;  Service: CV: Non-obst Ao-Iliac. R SFA-PopA patent with significant calcific stenosis of TP trunk-prox PTA (dominant) & CTO ATA  -> R PTA-TP PTCA   ABDOMINAL AORTOGRAM W/LOWER EXTREMITY N/A 11/06/2022   Procedure: ABDOMINAL AORTOGRAM W/LOWER EXTREMITY;  Surgeon: Wenona Hamilton, MD;  Location: [facility] INVASIVE CV LAB;  Service: Cardiovascular;  [name];   ABDOMINAL AORTOGRAM W/LOWER EXTREMITY Right 04/01/2023   Procedure: ABDOMINAL AORTOGRAM W/LOWER EXTREMITY;  Surgeon: Margherita Shell, MD;  Location: [facility] INVASIVE CV LAB;  Service:  Cardiovascular;  Laterality: Right;   ABIs  04/27/2012   mild bilateral arterial insufficiency   ANKLE FUSION Right 05/15/2023   Procedure: ARTHRODESIS ANKLE WITH HINDFOOT FUSION NAIL;  Surgeon: Laneta Pintos, MD;  [name];  Service: Orthopedics;  Laterality: Right;   APPLICATION OF WOUND VAC Right 03/31/2023   Procedure: APPLICATION OF WOUND VAC;  Surgeon: Laneta Pintos, MD;  [name];  Service: Orthopedics;  Laterality: Right;   BACK SURGERY  2005 x1   X2-2010   BUNIONECTOMY Right 11/11/2013   Procedure: RIGHT FOOT SILVER BUNIONECTOMY;  Surgeon: Amada Backer, MD;  Location: [facility];  Service: Orthopedics;  Laterality: Right;   CARDIAC CATHETERIZATION  12/11/2001   significant 3V CAD, normal LV function   CARDIOVERSION N/A 03/13/2022   Procedure: CARDIOVERSION;  Surgeon: Wendie Hamburg, MD;  Location: [facility] ENDOSCOPY;  Service: Cardiovascular;  [name];   CARDIOVERSION N/A 07/25/2022   Procedure: CARDIOVERSION;  Surgeon: Hugh Madura, MD;  [facility]: [facility]VAS[facility] CV LAB;  Service: Cardiovascular;  [name];   Carotid Doppler  04/27/2012   right internal carotid: Elevated velocities but no evidence of plaque. Left internal carotid 40-59%   CAROTID ENDARTERECTOMY  2005   Right; recent carotid Dopplers notes elevated velocities.    colonscopy     CORONARY ANGIOPLASTY  12/21/2001   PTCA of the distal and mid AV groove circ, unsuccessful PTCA of second OM total occlusion, unsuccessful PTCA of the apical LAD total occlusion   CORONARY ANGIOPLASTY WITH STENT  PLACEMENT  04/29/2012   PCI to 3 RCA lesions, Promus Premiere 2.62mmx8mm distally, mid was 2.77mx28mm and proximally 2.75x48mm, EF 55-60%   CORONARY STENT PLACEMENT  04/28/2012   PCI to LAD (3x62mm Xience DES postdilated to 3.25) and circ prox and mid (2 overlappinmg 2.70mmx12mmXience DES postdilated to 2.60mm)   DOPPLER ECHOCARDIOGRAPHY  04/28/2012   poor quality study: EF estimated  60-65%; unable to assess diastolic function (previously noted to have diastolic dysfunction); severely dilated left atrium and mild right atrium; dilated IVC consistent with increased central venous pressure.Aaron Aas   EXTERNAL FIXATION LEG Right 05/12/2023   Procedure: EXTERNAL FIXATION, LOWER EXTREMITY;  Surgeon: Laneta Pintos, MD;  [name];  Service: Orthopedics;  Laterality: Right;   HARDWARE REMOVAL Right 03/31/2023   Procedure: HARDWARE REMOVAL RIGHT ANKLE;  Surgeon: Laneta Pintos, MD;  [name];  Service: Orthopedics;  Laterality: Right;   HARDWARE REMOVAL Right 01/28/2023   Procedure: REMOVAL OF HARDWARE RIGHT ANKLE;  Surgeon: Hardy Lia, MD;  [name];  Service: Orthopedics;  Laterality: Right;   HARDWARE REMOVAL Right 06/21/2023   Procedure: REMOVAL, HARDWARE;  Surgeon: Laneta Pintos, MD;  [name];  Service: Orthopedics;  Laterality: Right;   HERNIA REPAIR     I & D EXTREMITY Right 01/02/2023   Procedure: IRRIGATION AND DEBRIDEMENT RIGHT ANKLE;  Surgeon: Hardy Lia, MD;  [name];  Service: Orthopedics;  Laterality: Right;   I & D EXTREMITY Right 04/02/2023   Procedure: DEBRIDEMENT RIGHT ANKLE;  Surgeon: Timothy Ford, MD;  Location: [facility] OR;  Service: Orthopedics;  Laterality: Right;   IR FLUORO GUIDE CV LINE LEFT  04/22/2023   IR FLUORO GUIDE CV LINE RIGHT  04/07/2023   IR PATIENT EVAL TECH 0-60 MINS  04/07/2023   IR RADIOLOGIST EVAL & MGMT  04/08/2023   IR REMOVAL TUN CV CATH W/O FL  05/05/2023   IR US  GUIDE VASC ACCESS LEFT  04/22/2023   IR US  GUIDE VASC ACCESS RIGHT  04/07/2023   LEFT AND RIGHT HEART CATHETERIZATION WITH CORONARY ANGIOGRAM N/A 04/27/2012   Procedure: LEFT AND RIGHT HEART CATHETERIZATION WITH CORONARY ANGIOGRAM;  Surgeon: Arleen Lacer, MD;  Location: [facility] CATH LAB;  Service: Cardiovascular;  [name];   LEFT AND RIGHT HEART CATHETERIZATION WITH CORONARY ANGIOGRAM N/A 05/14/2013   Procedure: LEFT AND RIGHT HEART  CATHETERIZATION WITH CORONARY ANGIOGRAM;  Surgeon: Millicent Ally, MD;  [name]; PCWP ;; [name], [name], Patent RCA ostial mid and distal stents - 70% distal runoff Dz   LUMBAR LAMINECTOMY/DECOMPRESSION MICRODISCECTOMY  01/07/2012   Procedure: LUMBAR LAMINECTOMY/DECOMPRESSION MICRODISCECTOMY 1 LEVEL;  Surgeon: Pasty Bongo, MD;  [name];  Service: Neurosurgery;  Laterality: Bilateral;   Lumbar Three-Four Decompression   LUMBAR LAMINECTOMY/DECOMPRESSION MICRODISCECTOMY N/A 07/26/2020   Procedure: Lumbar two-three Laminectomy/Foraminotomy;  Surgeon: Audie Bleacher, MD;  [name];  Service: Neurosurgery;  [name];   ORIF ANKLE FRACTURE Right 01/02/2023   Procedure: OPEN REDUCTION INTERNAL FIXATION (ORIF)RIGHT ANKLE FRACTURE;  Surgeon: Hardy Lia, MD;  [name];  Service: Orthopedics;  Laterality: Right;   PERCUTANEOUS CORONARY STENT INTERVENTION (PCI-S) N/A 04/28/2012   Procedure: PERCUTANEOUS CORONARY STENT INTERVENTION (PCI-S);  Surgeon: Millicent Ally, MD;  Location: [facility] CATH LAB;  Service: Cardiovascular;  [name];   PERCUTANEOUS CORONARY STENT INTERVENTION (  PCI-S) N/A 04/29/2012   Procedure: PERCUTANEOUS CORONARY STENT INTERVENTION (PCI-S);  Surgeon: Arleen Lacer, MD;  Location: [facility] CATH LAB;  Service: Cardiovascular;  [name];   PERIPHERAL VASCULAR ATHERECTOMY  11/06/2022   Procedure: PERIPHERAL VASCULAR ATHERECTOMY;  Surgeon: Wenona Hamilton, MD;  [facility];  Service: Cardiovascular;;   PERIPHERAL VASCULAR BALLOON ANGIOPLASTY Right 10/13/2019   Procedure: PERIPHERAL VASCULAR BALLOON ANGIOPLASTY;  Surgeon: Wenona Hamilton, MD;  [facility];  Service: Cardiovascular;  Laterality: Right;  PTCA of R TP Trunk into PTA (Drug-coated) => Follow-up ABIs  0.9 on the right and 0.99 on the left.   PERIPHERAL VASCULAR BALLOON ANGIOPLASTY Right 04/01/2023   Procedure: PERIPHERAL VASCULAR BALLOON ANGIOPLASTY;  Surgeon: Margherita Shell, MD;  [facility] LAB;  Service: Cardiovascular;  Laterality: Right;   PERIPHERAL VASCULAR INTERVENTION Right 04/01/2023   Procedure: PERIPHERAL VASCULAR INTERVENTION;  Surgeon: Margherita Shell, MD;  [facility] CV LAB;  Service: Cardiovascular;  Laterality: Right;   SPINE SURGERY     UMBILICAL HERNIA REPAIR  2009   steel mesh insert    Social History   Tobacco Use   Smoking status: Former    Current packs/day: 0.00    Average packs/day: 1 pack/day for 42.0 years (42.0 ttl pk-yrs)    Types: Cigarettes    Start date: 03/22/1961    Quit date: 03/23/2003    Years since quitting: 20.3   Smokeless tobacco: Never   Tobacco comments:    Former smoker 02/19/22  Vaping Use   Vaping status: Never Used  Substance Use Topics   Alcohol use: No    Alcohol/week: 0.0 standard drinks of alcohol   Drug use: No    Family History  Adopted: Yes  Family history unknown: Yes    Allergies  Allergen Reactions   Beta-Lactamase Inhibitors (Beta-Lactam) Hives, Itching and Rash    AX - penicillin     Ancef  given 9/24 with no obvious reaction   Metformin  And Related Diarrhea and Nausea Only   Other Nausea And Vomiting    General Anesthesia - sometimes causes nausea and vomiting * OK to use scopolamine  patch     Sulfa Antibiotics Hives and Other (See Comments)    Bactrim   Wellbutrin [Bupropion] Other (See Comments)    Mood Changes   Tetracyclines & Related Hives and Rash    doxycycline     Health Maintenance  Topic Date Due   COVID-19 Vaccine (1) Never done   Zoster Vaccines- Shingrix (1 of 2) 09/01/1972   Diabetic kidney evaluation - Urine ACR  05/27/2012   OPHTHALMOLOGY EXAM  03/19/2015   FOOT EXAM  08/30/2015   DTaP/Tdap/Td (2 - Td or Tdap) 01/02/2023   HEMOGLOBIN A1C  07/02/2023   INFLUENZA  VACCINE  09/19/2023   Medicare Annual Wellness (AWV)  10/02/2023   Diabetic kidney evaluation - eGFR measurement  06/24/2024   Colonoscopy  05/19/2029   Pneumonia Vaccine 40+ Years old  Completed   Hepatitis C Screening  Completed   HPV VACCINES  Aged Out   Meningococcal B Vaccine  Aged Out   Microbiology  Results for orders placed or performed during the hospital encounter of 06/19/23  Culture, blood (routine x 2)     Status: None   Collection Time: 06/19/23  8:23 PM   Specimen: BLOOD  Result Value Ref Range Status   Specimen Description BLOOD LEFT ANTECUBITAL  Final   Special Requests   Final  BOTTLES DRAWN AEROBIC AND ANAEROBIC Blood Culture results may not be optimal due to an inadequate volume of blood received in culture bottles   Culture   Final    NO GROWTH 5 DAYS Performed at [facility], [address] [place], [place] [address]    Report Status 06/24/2023 FINAL  Final  Culture, blood (routine x 2)     Status: None   Collection Time: 06/20/23  1:14 AM   Specimen: BLOOD LEFT HAND  Result Value Ref Range Status   Specimen Description BLOOD LEFT HAND  Final   Special Requests   Final    BOTTLES DRAWN AEROBIC AND ANAEROBIC Blood Culture adequate volume   Culture   Final    NO GROWTH 5 DAYS Performed at [facility], [address] [place], [place] [address]    Report Status 06/25/2023 FINAL  Final  Surgical pcr screen     Status: None   Collection Time: 06/21/23  5:53 AM   Specimen: Nasal Mucosa; Nasal Swab  Result Value Ref Range Status   MRSA, PCR NEGATIVE NEGATIVE Final   Staphylococcus aureus NEGATIVE NEGATIVE Final    Comment: (NOTE) The Xpert SA Assay (FDA approved for NASAL specimens in patients 24 years of age and older), is one component of a comprehensive surveillance program. It is not intended to diagnose infection nor to guide or monitor treatment. Performed at [facility], [address], [place], [place] [address]     *Note: Due to a large number of results and/or encounters for the requested time period, some results have not been displayed. A complete set of results can be found in Results Review.   Imaging  DG Chest Port 1 View Result Date: 07/17/2023 CLINICAL DATA:  Weakness. EXAM: PORTABLE CHEST 1 VIEW COMPARISON:  05/26/2023 FINDINGS: Normal sized heart with an interval decrease in size. Clear lungs with normal vascularity. Thoracic spine degenerative changes. IMPRESSION: No acute abnormality. Electronically Signed   By: Catherin Closs M.D.   On: 07/17/2023 14:40   US  EKG SITE RITE Result Date: 06/24/2023 If Site Rite image not attached, placement could not be confirmed due to current cardiac rhythm.  DG Ankle Complete Right Result Date: 06/21/2023 CLINICAL DATA:  Elective surgery. EXAM: RIGHT ANKLE - COMPLETE 3+ VIEW COMPARISON:  Radiograph 06/19/2023 FINDINGS: Two fluoroscopic spot views of the right ankle submitted from the operating room. Calcaneal screws have been removed. Rod traversing the calcaneus, talus and tibia remains. Fluoroscopy time 3 seconds. Dose 0.11 mGy. IMPRESSION: Intraoperative fluoroscopy during ankle surgery. Electronically Signed   By: Chadwick Colonel M.D.   On: 06/21/2023 09:59   DG C-Arm 1-60 Min-No Report Result Date: 06/21/2023 Fluoroscopy was utilized by the requesting physician.  No radiographic interpretation.   DG Ankle Complete Right Result Date: 06/19/2023 CLINICAL DATA:  Postoperative problems. EXAM: RIGHT FOOT - 2 VIEW; RIGHT ANKLE - COMPLETE 3+ VIEW COMPARISON:  Right ankle 05/15/2023 FINDINGS: Two views of the right foot and three views of the right ankle are obtained. Postoperative changes with intramedullary rod extending from the calcaneus through the talus and into the distal tibia. Two screws fix the proximal aspect of the rod and 2 screws fix the calcaneus. Old pin tracts in the distal fibula and tibia. Old healed and healing fracture deformities of the distal  fibular shaft and medial malleolus. Mildly displaced fracture deformity of the posterior tibial malleolus. Appearance of surgical hardware and fracture fragments is similar to prior study. No  new acute changes are demonstrated. Degenerative changes in the ankle joint and intertarsal joints. Degenerative changes in the first metatarsal-phalangeal joint. Soft tissue swelling over the dorsum of the foot. Vascular calcifications. IMPRESSION: Postoperative and posttraumatic changes in the right foot and ankle as described. No significant change since previous study. Diffuse degenerative changes. Soft tissue swelling over the dorsum of the foot. Electronically Signed   By: Boyce Byes M.D.   On: 06/19/2023 20:58   DG Foot 2 Views Right Result Date: 06/19/2023 CLINICAL DATA:  Postoperative problems. EXAM: RIGHT FOOT - 2 VIEW; RIGHT ANKLE - COMPLETE 3+ VIEW COMPARISON:  Right ankle 05/15/2023 FINDINGS: Two views of the right foot and three views of the right ankle are obtained. Postoperative changes with intramedullary rod extending from the calcaneus through the talus and into the distal tibia. Two screws fix the proximal aspect of the rod and 2 screws fix the calcaneus. Old pin tracts in the distal fibula and tibia. Old healed and healing fracture deformities of the distal fibular shaft and medial malleolus. Mildly displaced fracture deformity of the posterior tibial malleolus. Appearance of surgical hardware and fracture fragments is similar to prior study. No new acute changes are demonstrated. Degenerative changes in the ankle joint and intertarsal joints. Degenerative changes in the first metatarsal-phalangeal joint. Soft tissue swelling over the dorsum of the foot. Vascular calcifications. IMPRESSION: Postoperative and posttraumatic changes in the right foot and ankle as described. No significant change since previous study. Diffuse degenerative changes. Soft tissue swelling over the dorsum of the foot.  Electronically Signed   By: Boyce Byes M.D.   On: 06/19/2023 20:58     Objective: Vitals:   07/17/23 1024  Weight: 234 lb (106.1 kg)  Height: 6' (1.829 m)    Physical Exam Constitutional:      Appearance: Normal appearance.  HENT:     Head: Normocephalic and atraumatic.      Mouth: Mucous membranes are moist.  Eyes:    Conjunctiva/sclera: Conjunctivae normal.     Pupils: Pupils are equal, round, and b/l symmetrical    Cardiovascular:     Rate and Rhythm: Normal rate and regular rhythm.     Heart sounds:   Pulmonary:     Effort: Pulmonary effort is normal.     Breath sounds:   Abdominal:     General: Non distended     Palpations:  Musculoskeletal:        General: sitting in the wheel chair Rt foot with chronic skin changes, appears to be clinically improving with no signs of acute infection       Skin:    General: Skin is warm and dry.     Comments: rt arm picc ok with no signs of infection   Neurological:     General: grossly non focal     Mental Status: awake, alert and oriented to person, place, and time.   Psychiatric:        Mood and Affect: Mood normal.   Lab Results Lab Results  Component Value Date   WBC 5.9 06/25/2023   HGB 9.1 (L) 06/25/2023   HCT 27.5 (L) 06/25/2023   MCV 86.5 06/25/2023   PLT 134 (L) 06/25/2023    Lab Results  Component Value Date   CREATININE 1.56 (H) 06/25/2023   BUN 14 06/25/2023   NA 134 (L) 06/25/2023   K 3.5 06/25/2023   CL 102 06/25/2023   CO2 22 06/25/2023    Lab Results  Component Value  Date   ALT 12 06/20/2023   AST 18 06/20/2023   GGT 28 02/20/2018   ALKPHOS 94 06/20/2023   BILITOT 0.6 06/20/2023    Lab Results  Component Value Date   CHOL 84 04/02/2023   HDL 24 (L) 04/02/2023   LDLCALC 28 04/02/2023   TRIG 161 (H) 04/02/2023   CHOLHDL 3.5 04/02/2023   No results found for: "LABRPR", "RPRTITER" No results found for: "HIV1RNAQUANT", "HIV1RNAVL", "CD4TABS"    Assessment/Plan #  Complicated RT ankle wound  # Enterobacter cloacae bacteremia: source thought to be HDC vs rt ankle wound - s/p prolonged course of PO ciprofloxacin  on 4/26, restarted in the setting of new sores in the rt ankle  - Admitted 5/1-5/10 due to exposed hardware in rt ankle/drainage. He underwent  removal of rt ankle hardware by Dr Curtiss Dowdy on 06/21/23. Or cx not sent. Discharged on 6 weeks of IV daptomycin  and ertapenem  via PICC.   Plan  - complete 6 weeks of IV daptomycin  and ertapenem  through 6/12 as planned  - sending to ED due to low BP/patient complaining of dizziness and h/o passing out last week - Fu to be made pending ED visit   Addendum 5/30 at 8:22 am Per Dr Curtiss Dowdy, all hardware not out, rod is in and no immediate plans for removal.   I spent 40 minutes involved in face-to-face and non-face-to-face activities for this patient on the day of the visit. Professional time spent includes the following activities: Preparing to see the patient (review of tests), Obtaining and  reviewing separately obtained history (admission 5/1/discharge record 5/10), Performing a medically appropriate examination and evaluation , referring and communicating with other health care professionals, Documenting clinical information in the EMR, Independently interpreting results (not separately reported), Communicating results to the patient/caregiver, Counseling and educating the patient/caregiver and Care coordination (not separately reported).   Of note, portions of this note may have been created with voice recognition software. While this note has been edited for accuracy, occasional wrong-word or 'sound-a-like' substitutions may have occurred due to the inherent limitations of voice recognition software.   Melvina Stage, MD [facility] 07/17/2023, 10:27 AM

## 2023-07-17 NOTE — Telephone Encounter (Signed)
 Patient's spouse called upset due to the patient being transported by EMS to the ED.  He states he has asked several times that EMS not be called due to the cost.  He is requesting additional documentation be placed in the patient's chart as to why he was sent to the ED.  I have asked Dr. Gillian Lacrosse to reach out to the patient's spouse and his phone number provided for her.  Demetreus Lothamer Adel Holt, CMA

## 2023-07-17 NOTE — H&P (Signed)
 History and Physical    Patient: Harold [name] JWJ:[id number] DOB: [birth year] DOA: 07/17/2023 DOS: the patient was seen and examined on 07/17/2023 PCP: Tita Form, MD  Patient coming from: ID Office. Chief complaint: Chief Complaint  Patient presents with   Hypotension   HPI:  Harold [name] is a 68 y.o. male with past medical history  of  coronary artery disease status post PCI, chronic kidney disease stage IV, COPD, PAH, OSA, diabetes mellitus with neuropathy, chronic venous insufficiency, orthostatic hypotension, hyperlipidemia, diabetic foot ulcer, traumatic right ankle fracture on 12/2022 requiring hardware placement complicated by need for multiple surgeries and infection with Enterobacter cloacae bacteremia and recent right ankle arthrodesis on 05/15/2023 currently being followed by infectious disease and saw them today and drainage has resolved.   ED Course: Pt in ed at bedside  is alert awake oriented afebrile in no distress. Vital signs in the ED were notable for the following:  Vitals:   07/17/23 1147 07/17/23 1149 07/17/23 1415 07/17/23 1550  BP: (!) 151/73  (!) 169/75 (!) 178/94  Pulse: 68  65 61  Temp: 97.8 F (36.6 C)   97.7 F (36.5 C)  Resp: 16  15 10   Height:  6' (1.829 m)    Weight:  106 kg    SpO2: 99%  100% 100%  TempSrc: Axillary   Oral  BMI (Calculated):  31.69    >>ED evaluation thus far shows: CMP shows glucose 182 BUN of 25 creatinine 2.12 EGFR of 33 at baseline, alk phos 128 albumin  2.2 normal LFTs otherwise. Lactic acid of 1.0. CBC without differential shows a white count of 6.8 hemoglobin of 12.2 platelet count of 280 RDW of 15.9. Most recent echocardiogram in February 2025 showing EF of 85 to seek 60%, wall motion abnormality with basal inferior hypokinesis, 2 diastolic dysfunction, left and right atrial size dilated, aortic valve is tricuspid, no atrial level shunt detected.   >>While in the ED patient received the  following: Medications  fludrocortisone (FLORINEF) 0.1mg /mL oral suspension 0.2 mg (has no administration in time range)  midodrine  (PROAMATINE ) tablet 10 mg (has no administration in time range)    Review of Systems  Cardiovascular:        Hypotension.   Neurological:  Positive for dizziness.   Past Medical History:  Diagnosis Date   Anemia    Anginal pain (HCC) 04/2013   Cardiac cath showed patent stents with distal LAD, circumflex-OM and RCA disease in small vessels.   Anxiety    Atrial fibrillation (HCC) 12/2021   CAD S/P percutaneous coronary angioplasty 2003, 04/2012   status post PCI to LAD, circumflex-OM 2, RCA   Carotid artery occlusion    Chronic renal insufficiency, stage II (mild)    Chronic venous insufficiency    varicosities, no reflux; dopplers 04/14/12- valvular insufficiency in the R and L GSV   Complication of anesthesia    COPD, mild (HCC)    Depression    situaltional    Diabetes mellitus    Diabetic neuropathy (HCC) 08/24/2019   Dyslipidemia associated with type 2 diabetes mellitus (HCC)    Fall at home, initial encounter 01/02/2023   GERD (gastroesophageal reflux disease)    History of cardioversion 12/2021   at [facility]   HTN (hypertension)    Hypothyroidism    Neuromuscular disorder (HCC)    neuropathy in feet   Neuropathy    notably improved following PCI with improved cardiac function   Obesity    Obesity, Class  II, BMI 35.0-39.9, with comorbidity (see actual BMI)    BMI 39; wgt loss efforts in place; seeing Dietician   Open ankle fracture 01/02/2023   Orthostatic hypotension    PONV (postoperative nausea and vomiting)    "[employer]"   Pulmonary hypertension (HCC) 04/2012   PA pressure   Sleep apnea    uses nightly   Vitamin D  deficiency    per facility notes   Past Surgical History:  Procedure Laterality Date   ABDOMINAL AORTAGRAM N/A 11/15/2011   Procedure: ABDOMINAL Tommi Fraise;  Surgeon: Richrd Char, MD;  Location:  [facility] CATH LAB;  Service: Cardiovascular;  [name];   ABDOMINAL AORTOGRAM W/LOWER EXTREMITY N/A 10/13/2019   Procedure: ABDOMINAL AORTOGRAM W/LOWER EXTREMITY;  Surgeon: Wenona Hamilton, MD;  Location: [facility] INVASIVE CV LAB;  Service: CV: Non-obst Ao-Iliac. R SFA-PopA patent with significant calcific stenosis of TP trunk-prox PTA (dominant) & CTO ATA  -> R PTA-TP PTCA   ABDOMINAL AORTOGRAM W/LOWER EXTREMITY N/A 11/06/2022   Procedure: ABDOMINAL AORTOGRAM W/LOWER EXTREMITY;  Surgeon: Wenona Hamilton, MD;  Location: [facility] INVAS[facility] CV LAB;  Service: Cardiovascular;  [name];   ABDOMINAL AORTOGRAM W/LOWER EXTREMITY Right 04/01/2023   Procedure: ABDOMINAL AORTOGRAM W/LOWER EXTREMITY;  Surgeon: Margherita Shell, MD;  Location: [facility] INVASIVE CV LAB;  Service: Cardiovascular;  Laterality: Right;   ABIs  04/27/2012   mild bilateral arterial insufficiency   ANKLE FUSION Right 05/15/2023   Procedure: ARTHRODESIS ANKLE WITH HINDFOOT FUSION NAIL;  Surgeon: Laneta Pintos, MD;  [name];  Service: Orthopedics;  Laterality: Right;   APPLICATION OF WOUND VAC Right 03/31/2023   Procedure: APPLICATION OF WOUND VAC;  Surgeon: Laneta Pintos, MD;  [name];  Service: Orthopedics;  Laterality: Right;   BACK SURGERY  2005 x1   X2-2010   BUNIONECTOMY Right 11/11/2013   Procedure: RIGHT FOOT SILVER BUNIONECTOMY;  Surgeon: Amada Backer, MD;  Location: [facility];  Service: Orthopedics;  Laterality: Right;   CARDIAC CATHETERIZATION  12/11/2001   significant 3V CAD, normal LV function   CARDIOVERSION N/A 03/13/2022   Procedure: CARDIOVERSION;  Surgeon: Wendie Hamburg, MD;  Location: [facility] ENDOSCOPY;  Service: Cardiovascular;  [name];   CARDIOVERSION N/A 07/25/2022   Procedure: CARDIOVERSION;  Surgeon: Hugh Madura, MD;  [facility]: [facility]AS[facility] CV LAB;  Service: Cardiovascular;  [name];   Carotid Doppler  04/27/2012   right internal carotid: Elevated velocities but  no evidence of plaque. Left internal carotid 40-59%   CAROTID ENDARTERECTOMY  2005   Right; recent carotid Dopplers notes elevated velocities.    colonscopy     CORONARY ANGIOPLASTY  12/21/2001   PTCA of the distal and mid AV groove circ, unsuccessful PTCA of second OM total occlusion, unsuccessful PTCA of the apical LAD total occlusion   CORONARY ANGIOPLASTY WITH STENT PLACEMENT  04/29/2012   PCI to 3 RCA lesions, Promus Premiere 2.52mmx8mm distally, mid was 2.62mx28mm and proximally 2.75x81mm, EF 55-60%   CORONARY STENT PLACEMENT  04/28/2012   PCI to LAD (3x32mm Xience DES postdilated to 3.25) and circ prox and mid (2 overlappinmg 2.93mmx12mmXience DES postdilated to 2.30mm)   DOPPLER ECHOCARDIOGRAPHY  04/28/2012   poor quality study: EF estimated 60-65%; unable to assess diastolic function (previously noted to have diastolic dysfunction); severely dilated left atrium and mild right atrium; dilated IVC consistent with increased central venous pressure.Aaron Aas   EXTERNAL FIXATION LEG Right 05/12/2023   Procedure: EXTERNAL FIXATION, LOWER EXTREMITY;  Surgeon: Curtiss Dowdy,  Florentina Huntsman, MD;  [name];  Service: Orthopedics;  Laterality: Right;   HARDWARE REMOVAL Right 03/31/2023   Procedure: HARDWARE REMOVAL RIGHT ANKLE;  Surgeon: Laneta Pintos, MD;  [name];  Service: Orthopedics;  Laterality: Right;   HARDWARE REMOVAL Right 01/28/2023   Procedure: REMOVAL OF HARDWARE RIGHT ANKLE;  Surgeon: Hardy Lia, MD;  [name];  Service: Orthopedics;  Laterality: Right;   HARDWARE REMOVAL Right 06/21/2023   Procedure: REMOVAL, HARDWARE;  Surgeon: Laneta Pintos, MD;  [name];  Service: Orthopedics;  Laterality: Right;   HERNIA REPAIR     I & D EXTREMITY Right 01/02/2023   Procedure: IRRIGATION AND DEBRIDEMENT RIGHT ANKLE;  Surgeon: Hardy Lia, MD;  [name];  Service: Orthopedics;  Laterality: Right;   I & D EXTREMITY Right 04/02/2023   Procedure: DEBRIDEMENT RIGHT ANKLE;   Surgeon: Timothy Ford, MD;  Location: [facility] OR;  Service: Orthopedics;  Laterality: Right;   IR FLUORO GUIDE CV LINE LEFT  04/22/2023   IR FLUORO GUIDE CV LINE RIGHT  04/07/2023   IR PATIENT EVAL TECH 0-60 MINS  04/07/2023   IR RADIOLOGIST EVAL & MGMT  04/08/2023   IR REMOVAL TUN CV CATH W/O FL  05/05/2023   IR US  GUIDE VASC ACCESS LEFT  04/22/2023   IR US  GUIDE VASC ACCESS RIGHT  04/07/2023   LEFT AND RIGHT HEART CATHETERIZATION WITH CORONARY ANGIOGRAM N/A 04/27/2012   Procedure: LEFT AND RIGHT HEART CATHETERIZATION WITH CORONARY ANGIOGRAM;  Surgeon: Arleen Lacer, MD;  Location: [facility] CATH LAB;  Service: Cardiovascular;  [name];   LEFT AND RIGHT HEART CATHETERIZATION WITH CORONARY ANGIOGRAM N/A 05/14/2013   Procedure: LEFT AND RIGHT HEART CATHETERIZATION WITH CORONARY ANGIOGRAM;  Surgeon: Millicent Ally, MD;  [name]; PCWP ;; [name], [name], Patent RCA ostial mid and distal stents - 70% distal runoff Dz   LUMBAR LAMINECTOMY/DECOMPRESSION MICRODISCECTOMY  01/07/2012   Procedure: LUMBAR LAMINECTOMY/DECOMPRESSION MICRODISCECTOMY 1 LEVEL;  Surgeon: Pasty Bongo, MD;  [name];  Service: Neurosurgery;  Laterality: Bilateral;   Lumbar Three-Four Decompression   LUMBAR LAMINECTOMY/DECOMPRESSION MICRODISCECTOMY N/A 07/26/2020   Procedure: Lumbar two-three Laminectomy/Foraminotomy;  Surgeon: Audie Bleacher, MD;  [name];  Service: Neurosurgery;  [name];   ORIF ANKLE FRACTURE Right 01/02/2023   Procedure: OPEN REDUCTION INTERNAL FIXATION (ORIF)RIGHT ANKLE FRACTURE;  Surgeon: Hardy Lia, MD;  [name];  Service: Orthopedics;  Laterality: Right;   PERCUTANEOUS CORONARY STENT INTERVENTION (PCI-S) N/A 04/28/2012   Procedure: PERCUTANEOUS CORONARY STENT INTERVENTION (PCI-S);  Surgeon: Millicent Ally, MD;  Location:  [facility] CATH LAB;  Service: Cardiovascular;  [name];   PERCUTANEOUS CORONARY STENT INTERVENTION (PCI-S) N/A 04/29/2012   Procedure: PERCUTANEOUS CORONARY STENT INTERVENTION (PCI-S);  Surgeon: Arleen Lacer, MD;  Location: [facility] CATH LAB;  Service: Cardiovascular;  [name];   PERIPHERAL VASCULAR ATHERECTOMY  11/06/2022   Procedure: PERIPHERAL VASCULAR ATHERECTOMY;  Surgeon: Wenona Hamilton, MD;  [facility];  Service: Cardiovascular;;   PERIPHERAL VASCULAR BALLOON ANGIOPLASTY Right 10/13/2019   Procedure: PERIPHERAL VASCULAR BALLOON ANGIOPLASTY;  Surgeon: Wenona Hamilton, MD;  [facility] LAB;  Service: Cardiovascular;  Laterality: Right;  PTCA of R TP Trunk into PTA (Drug-coated) => Follow-up ABIs 0.9 on the right and 0.99 on the left.  PERIPHERAL VASCULAR BALLOON ANGIOPLASTY Right 04/01/2023   Procedure: PERIPHERAL VASCULAR BALLOON ANGIOPLASTY;  Surgeon: Margherita Shell, MD;  [facility]AS[facility] CV LAB;  Service: Cardiovascular;  Laterality: Right;   PERIPHERAL VASCULAR INTERVENTION Right 04/01/2023   Procedure: PERIPHERAL VASCULAR INTERVENTION;  Surgeon: Margherita Shell, MD;  [facility]VAS[facility] CV LAB;  Service: Cardiovascular;  Laterality: Right;   SPINE SURGERY     UMBILICAL HERNIA REPAIR  2009   steel mesh insert    reports that he quit smoking about 20 years ago. His smoking use included cigarettes. He started smoking about 62 years ago. He has a 42 pack-year smoking history. He has never used smokeless tobacco. He reports that he does not drink alcohol and does not use drugs. Allergies  Allergen Reactions   Beta-Lactamase Inhibitors (Beta-Lactam) Hives, Itching and Rash    AX - penicillin     Ancef  given 9/24 with no obvious reaction   Metformin  And Related Diarrhea and Nausea Only   Other Nausea And Vomiting    General Anesthesia - sometimes causes nausea and vomiting * OK to use scopolamine  patch     Sulfa Antibiotics Hives and Other  (See Comments)    Bactrim   Wellbutrin [Bupropion] Other (See Comments)    Mood Changes   Tetracyclines & Related Hives and Rash    doxycycline    Family History  Adopted: Yes  Family history unknown: Yes   Prior to Admission medications   Medication Sig Start Date End Date Taking? Authorizing Provider  acetaminophen  (TYLENOL ) 500 MG tablet Take 1 tablet (500 mg total) by mouth every 6 (six) hours as needed for mild pain (pain score 1-3). 01/07/23   Hongalgi, Anand D, MD  allopurinol  (ZYLOPRIM ) 100 MG tablet Take 0.5 tablets (50 mg total) by mouth daily. 05/10/23   Modena Andes, MD  amiodarone  (PACERONE ) 200 MG tablet Take 1 tablet (200 mg total) by mouth 2 (two) times daily. 05/10/23   Modena Andes, MD  apixaban  (ELIQUIS ) 5 MG TABS tablet TAKE 1 TABLET BY MOUTH TWICE A DAY 02/17/23   Arleen Lacer, MD  atorvastatin  (LIPITOR) 40 MG tablet Take 1 tablet (40 mg total) by mouth daily. Patient taking differently: Take 40 mg by mouth at bedtime. 05/10/23 06/21/23  Modena Andes, MD  clopidogrel  (PLAVIX ) 75 MG tablet Take 75 mg by mouth every morning. 02/04/23   [provider]  Continuous Glucose Sensor (FREESTYLE LIBRE 3 PLUS SENSOR) MISC Inject 1 Device into the skin every 14 (fourteen) days. 06/11/23   [provider]  daptomycin  (CUBICIN ) IVPB Inject 800 mg into the vein daily. Indication:  Hardware associated ankle osteomyelitis  First Dose: Yes Last Day of Therapy:  08/02/23 Labs - Once weekly:  CBC/D, BMP, and CPK Labs - Once weekly: ESR and CRP 06/28/23 08/07/23  Doroteo Gasmen, MD  docusate sodium  (COLACE) 100 MG capsule Take 1 capsule (100 mg total) by mouth 2 (two) times daily. 06/28/23   Doroteo Gasmen, MD  DULoxetine  (CYMBALTA ) 60 MG capsule Take 60 mg by mouth 2 (two) times daily.    [provider]  ertapenem  (INVANZ ) IVPB Inject 1 g into the vein daily. Indication:  Hardware associated ankle osteomyelitis  First Dose: Yes Last Day of Therapy:   08/02/23 Labs - Once weekly:  CBC/D and BMP, Labs - Once weekly: ESR and CRP 06/28/23 08/07/23  Fonnie Iba I, MD  ezetimibe  (ZETIA ) 10 MG tablet Take 10 mg by mouth at bedtime. 05/09/22  [provider]  ferrous sulfate  325 (65 FE) MG tablet Take 325 mg by mouth 3 (three) times a week. Monday, Wednesday, Friday 06/08/23   [provider]  fludrocortisone (FLORINEF) 0.1 MG tablet Take 1 tablet (0.1 mg total) by mouth daily. 07/15/23   Wenona Hamilton, MD  furosemide  (LASIX ) 80 MG tablet Take 0.5 tablets (40 mg total) by mouth daily. May take additional tablet for weight gain of 3 pounds in one day or five pounds in one week 01/14/23   [place], Rema Care, DO  gabapentin  (NEURONTIN ) 300 MG capsule Take 1 capsule (300 mg total) by mouth at bedtime. 05/10/23   Modena Andes, MD  insulin  aspart (NOVOLOG ) 100 UNIT/ML injection Inject 0-15 Units into the skin 3 (three) times daily with meals. 06/28/23   Doroteo Gasmen, MD  insulin  aspart (NOVOLOG ) 100 UNIT/ML injection Inject 0-5 Units into the skin at bedtime. 06/28/23   Doroteo Gasmen, MD  insulin  glargine-yfgn (SEMGLEE ) 100 UNIT/ML injection Inject 0.15 mLs (15 Units total) into the skin at bedtime. 06/28/23   Doroteo Gasmen, MD  leptospermum manuka honey (MEDIHONEY) PSTE paste Apply 1 Application topically daily. 06/29/23   Doroteo Gasmen, MD  levothyroxine  (SYNTHROID , LEVOTHROID) 88 MCG tablet Take 88 mcg by mouth daily before breakfast.    [provider]  magnesium  oxide (MAG-OX) 400 MG tablet Take 1 tablet by mouth daily. 06/08/23   [provider]  methocarbamol  (ROBAXIN ) 500 MG tablet Take 1 tablet (500 mg total) by mouth every 6 (six) hours as needed for muscle spasms. 05/20/23   Versie Gores, PA-C  midodrine  (PROAMATINE ) 10 MG tablet Take 1 tablet (10 mg total) by mouth 3 (three) times daily with meals. Take one hour prior to physical therapy 07/15/23   Wenona Hamilton, MD  Multiple Vitamin  (MULTIVITAMIN WITH MINERALS) TABS tablet Take 1 tablet by mouth daily. 01/08/23   Hongalgi, Anand D, MD  nitroGLYCERIN  (NITROSTAT ) 0.4 MG SL tablet PLACE 1 TAB UNDER THE TONGUE EVERY 5 MINUTES AS NEEDED FOR CHEST PAIN (SEVERE PRESSURE OR TIGHTNESS) Patient taking differently: Place 0.4 mg under the tongue every 5 (five) minutes as needed for chest pain. PLACE 1 TAB UNDER THE TONGUE EVERY 5 MINUTES AS NEEDED FOR CHEST PAIN (SEVERE PRESSURE OR TIGHTNESS) 02/22/19   Arleen Lacer, MD  nutrition supplement, JUVEN, (JUVEN) PACK Take 1 packet by mouth 2 (two) times daily between meals. 06/28/23   Doroteo Gasmen, MD  Nystatin (GERHARDT'S BUTT CREAM) CREA Apply 1 Application topically 2 (two) times daily. 06/28/23   Doroteo Gasmen, MD  oxyCODONE  (OXY IR/ROXICODONE ) 5 MG immediate release tablet Take 1 tablet (5 mg total) by mouth every 4 (four) hours as needed for severe pain (pain score 7-10). 06/28/23   Fonnie Iba I, MD  pantoprazole  (PROTONIX ) 40 MG tablet TAKE 1 TABLET BY MOUTH EVERY DAY 12/13/22   Dunn, Dayna N, PA-C  polyethylene glycol (MIRALAX  / GLYCOLAX ) 17 g packet Take 17 g by mouth daily as needed for mild constipation. 06/28/23   Doroteo Gasmen, MD  tamsulosin  (FLOMAX ) 0.4 MG CAPS capsule Take 0.4 mg by mouth daily. 06/16/23   [provider]  tirzepatide  (MOUNJARO ) 15 MG/0.5ML Pen Inject 15 mg into the skin once a week. Patient taking differently: Inject 15 mg into the skin once a week. Sunday 12/30/22     Vitamin D , Ergocalciferol , (DRISDOL ) 1.25 MG (50000 UNIT) CAPS capsule Take 1 capsule (50,000 Units total) by mouth every  7 (seven) days. Patient taking differently: Take 50,000 Units by mouth every 7 (seven) days. Sundays 05/27/23   Haydee Lipa, MD                                                                                 Vitals:   07/17/23 1147 07/17/23 1149 07/17/23 1415 07/17/23 1550  BP: (!) 151/73  (!) 169/75 (!) 178/94  Pulse: 68  65 61  Resp:  16  15 10   Temp: 97.8 F (36.6 C)   97.7 F (36.5 C)  TempSrc: Axillary   Oral  SpO2: 99%  100% 100%  Weight:  106 kg    Height:  6' (1.829 m)     Physical Exam Vitals and nursing note reviewed.  Constitutional:      General: He is not in acute distress. HENT:     Head: Normocephalic and atraumatic.     Right Ear: Hearing normal.     Left Ear: Hearing normal.     Nose: No nasal deformity.     Mouth/Throat:     Lips: Pink.  Eyes:     General: Lids are normal.     Extraocular Movements: Extraocular movements intact.  Cardiovascular:     Rate and Rhythm: Normal rate and regular rhythm.     Heart sounds: Normal heart sounds.  Pulmonary:     Effort: Pulmonary effort is normal.     Breath sounds: Normal breath sounds.  Abdominal:     General: Bowel sounds are normal. There is no distension.     Palpations: Abdomen is soft. There is no mass.     Tenderness: There is no abdominal tenderness.  Musculoskeletal:     Right lower leg: No edema.     Left lower leg: No edema.  Skin:    General: Skin is warm.  Neurological:     General: No focal deficit present.     Mental Status: He is alert and oriented to person, place, and time.     Cranial Nerves: Cranial nerves 2-12 are intact.  Psychiatric:        Speech: Speech normal.     Labs on Admission: I have personally reviewed following labs and imaging studies CBC: Recent Labs  Lab 07/17/23 1238  WBC 6.8  HGB 12.2*  HCT 37.3*  MCV 87.8  PLT 280   Basic Metabolic Panel: Recent Labs  Lab 07/17/23 1238  NA 138  K 3.6  CL 103  CO2 22  GLUCOSE 182*  BUN 25*  CREATININE 2.12*  CALCIUM  8.7*   GFR: Estimated Creatinine Clearance: 41.4 mL/min (A) (by C-G formula based on SCr of 2.12 mg/dL (H)). Liver Function Tests: Recent Labs  Lab 07/17/23 1238  AST 39  ALT 16  ALKPHOS 128*  BILITOT 0.6  PROT 6.0*  ALBUMIN  2.2*   No results for input(s): "LIPASE", "AMYLASE" in the last 168 hours. No results for input(s):  "AMMONIA" in the last 168 hours. Coagulation Profile: No results for input(s): "INR", "PROTIME" in the last 168 hours. Cardiac Enzymes: No results for input(s): "CKTOTAL", "CKMB", "CKMBINDEX", "TROPONINI" in the last 168 hours. BNP (last 3 results) No results for input(s): "PROBNP"  in the last 8760 hours. HbA1C: No results for input(s): "HGBA1C" in the last 72 hours. CBG: No results for input(s): "GLUCAP" in the last 168 hours. Lipid Profile: No results for input(s): "CHOL", "HDL", "LDLCALC", "TRIG", "CHOLHDL", "LDLDIRECT" in the last 72 hours. Thyroid  Function Tests: No results for input(s): "TSH", "T4TOTAL", "FREET4", "T3FREE", "THYROIDAB" in the last 72 hours. Anemia Panel: No results for input(s): "VITAMINB12", "FOLATE", "FERRITIN", "TIBC", "IRON", "RETICCTPCT" in the last 72 hours. Urine analysis:    Component Value Date/Time   COLORURINE YELLOW 04/15/2023 1732   APPEARANCEUR CLOUDY (A) 04/15/2023 1732   LABSPEC 1.014 04/15/2023 1732   PHURINE 5.0 04/15/2023 1732   GLUCOSEU >=500 (A) 04/15/2023 1732   HGBUR MODERATE (A) 04/15/2023 1732   BILIRUBINUR NEGATIVE 04/15/2023 1732   BILIRUBINUR negative 05/12/2018 1213   BILIRUBINUR small 06/29/2014 0859   KETONESUR NEGATIVE 04/15/2023 1732   PROTEINUR >=300 (A) 04/15/2023 1732   UROBILINOGEN 0.2 05/12/2018 1213   UROBILINOGEN 1.0 05/02/2014 2015   NITRITE NEGATIVE 04/15/2023 1732   LEUKOCYTESUR NEGATIVE 04/15/2023 1732   Radiological Exams on Admission: DG Chest Port 1 View Result Date: 07/17/2023 CLINICAL DATA:  Weakness. EXAM: PORTABLE CHEST 1 VIEW COMPARISON:  05/26/2023 FINDINGS: Normal sized heart with an interval decrease in size. Clear lungs with normal vascularity. Thoracic spine degenerative changes. IMPRESSION: No acute abnormality. Electronically Signed   By: Catherin Closs M.D.   On: 07/17/2023 14:40   Data Reviewed: Relevant notes from primary care and specialist visits, past discharge summaries as available in EHR,  including Care Everywhere. Prior diagnostic testing as pertinent to current admission diagnoses, Updated medications and problem lists for reconciliation ED course, including vitals, labs, imaging, treatment and response to treatment,Triage notes, nursing and pharmacy notes and ED provider's notes Notable results as noted in HPI.Discussed case with EDMD/ ED APP/ or Specialty MD on call and as needed.  Assessment & Plan  >>Hypotension: Differentials include: Autonomic neuropathy-secondary to his longstanding history of diabetes, will continue with midodrine  and Florinef.  Bladder studies as needed.  Optimal control of blood sugars.  Differentials also include sepsis-will collect blood cultures. Dimer is pending although PE chance is low as pt is fully anticoagulated with eliquis .   Medication related-in this patient I would definitely avoid medications that can cause contribute or worsen his blood pressure.  Patient is currently on Cymbalta  60 mg which I will change to 40 mg dosing currently, Flomax  may also be potentiating this, intermittent need for diuresis and diuretic therapy is also going to be contributing for this.  Will defer to PCP for a slow taper off and other options.  Currently we will hold his blood pressure medications and continue with midodrine  and Florinef.   >> Hypothyroidism: Continue levothyroxine  and Free T4 and a TSH check.   >> Diabetes mellitus type 2: Consistent carb diet, glycemic protocol and sliding scale insulin  regimen management.  >> OSA on CPAP/ pulmonary HTN: Cont cpap at night per home settings.   >> PAD: Patient is to have a repeat ABI and lower extremity arterial duplex per cardiology note.   >> Persistent A-fib: Currently in sinus rhythm on exam patient is on amiodarone  which we will continue along with Eliquis .  >>BPH: Continued flomax .   >>Rt Ankle hardware infection: Pharmacy consult  to continue  ertapenem  and daptomycin .    DVT  prophylaxis:  Eliquis .  Consults:  Dr.Nishan - Cardiology.  Advance Care Planning:    Code Status: Full Code   Family Communication:  None.  Disposition Plan:  Home. Severity of Illness: The appropriate patient status for this patient is INPATIENT. Inpatient status is judged to be reasonable and necessary in order to provide the required intensity of service to ensure the patient's safety. The patient's presenting symptoms, physical exam findings, and initial radiographic and laboratory data in the context of their chronic comorbidities is felt to place them at high risk for further clinical deterioration. Furthermore, it is not anticipated that the patient will be medically stable for discharge from the hospital within 2 midnights of admission.   * I certify that at the point of admission it is my clinical judgment that the patient will require inpatient hospital care spanning beyond 2 midnights from the point of admission due to high intensity of service, high risk for further deterioration and high frequency of surveillance required.*  Unresulted Labs (From admission, onward)     Start     Ordered   07/18/23 0500  Comprehensive metabolic panel  Tomorrow morning,   R        07/17/23 1617   07/18/23 0500  CBC  Tomorrow morning,   R        07/17/23 1617   07/18/23 0500  Magnesium   Tomorrow morning,   R        07/17/23 1617   07/17/23 1618  Magnesium   Add-on,   AD        07/17/23 1617   07/17/23 1618  T4, free  Add-on,   AD        07/17/23 1617   07/17/23 1618  TSH  Add-on,   AD        07/17/23 1617   07/17/23 1609  D-dimer, quantitative  ONCE - STAT,   STAT        07/17/23 1608   07/17/23 1211  Blood culture (routine x 2)  BLOOD CULTURE X 2,   R      07/17/23 1211            Orders Placed This Encounter  Procedures   Critical Care   Blood culture (routine x 2)   DG Chest Port 1 View   CBC   Comprehensive metabolic panel   D-dimer, quantitative   Comprehensive  metabolic panel   CBC   Magnesium    Magnesium    T4, free   TSH   Diet heart healthy/carb modified Room service appropriate? Yes; Fluid consistency: Thin   Maintain IV access   Vital signs   Notify physician (specify)   Mobility Protocol: No Restrictions RN to initiate protocols based on patient's level of care   Refer to Sidebar Report Refer to ICU, Med-Surg, Progressive, and Step-Down Mobility Protocol Sidebars   Initiate Adult Central Line Maintenance and Catheter Protocol for patients with central line (CVC, PICC, Port, Hemodialysis, Trialysis)   Daily weights   Intake and Output   Do not place and if present remove PureWick   Initiate Oral Care Protocol   Initiate Carrier Fluid Protocol   RN may order General Admission PRN Orders utilizing "General Admission PRN medications" (through manage orders) for the following patient needs: allergy symptoms (Claritin ), cold sores (Carmex), cough (Robitussin DM), eye irritation (Liquifilm Tears), hemorrhoids (Tucks), indigestion (Maalox), minor skin irritation (Hydrocortisone  Cream), muscle pain (Ben Gay), nose irritation (saline nasal spray) and sore throat (Chloraseptic spray).   Cardiac Monitoring - Continuous Indefinite   Full code   Inpatient consult to Cardiology   Consult to hospitalist   pharmacy consult   Pulse oximetry  check with vital signs   Oxygen therapy Mode or (Route): Nasal cannula; Liters Per Minute: 2; Keep O2 saturation between: greater than 92 %   I-Stat CG4 Lactic Acid   ED EKG   EKG 12-Lead   Place in observation (patient's expected length of stay will be less than 2 midnights)   Aspiration precautions   Fall precautions    Author: Lavanda Porter, MD 12 pm -8 pm. [facility] 07/17/2023 4:32 PM >>Please note for any concern,or critical results after hours past 8pm please contact the [facility] floor coverage provider from 7 PM- 7 AM. For on call review [email or web address], username [email or web address] and PW: your phone  number<<

## 2023-07-18 ENCOUNTER — Other Ambulatory Visit: Payer: Self-pay | Admitting: Vascular Surgery

## 2023-07-18 ENCOUNTER — Encounter: Payer: Self-pay | Admitting: Infectious Diseases

## 2023-07-18 DIAGNOSIS — Z452 Encounter for adjustment and management of vascular access device: Secondary | ICD-10-CM | POA: Insufficient documentation

## 2023-07-18 DIAGNOSIS — I6523 Occlusion and stenosis of bilateral carotid arteries: Secondary | ICD-10-CM

## 2023-07-18 DIAGNOSIS — I959 Hypotension, unspecified: Secondary | ICD-10-CM | POA: Diagnosis not present

## 2023-07-18 LAB — COMPREHENSIVE METABOLIC PANEL WITH GFR
ALT: 15 U/L (ref 0–44)
AST: 36 U/L (ref 15–41)
Albumin: 1.9 g/dL — ABNORMAL LOW (ref 3.5–5.0)
Alkaline Phosphatase: 109 U/L (ref 38–126)
Anion gap: 11 (ref 5–15)
BUN: 25 mg/dL — ABNORMAL HIGH (ref 8–23)
CO2: 24 mmol/L (ref 22–32)
Calcium: 8.1 mg/dL — ABNORMAL LOW (ref 8.9–10.3)
Chloride: 104 mmol/L (ref 98–111)
Creatinine, Ser: 2.25 mg/dL — ABNORMAL HIGH (ref 0.61–1.24)
GFR, Estimated: 31 mL/min — ABNORMAL LOW (ref >60)
Glucose, Bld: 110 mg/dL — ABNORMAL HIGH (ref 70–99)
Potassium: 3.4 mmol/L — ABNORMAL LOW (ref 3.5–5.1)
Sodium: 139 mmol/L (ref 135–145)
Total Bilirubin: 0.8 mg/dL (ref 0.0–1.2)
Total Protein: 5.3 g/dL — ABNORMAL LOW (ref 6.5–8.1)

## 2023-07-18 LAB — CBC
HCT: 30.7 % — ABNORMAL LOW (ref 39.0–52.0)
Hemoglobin: 10.2 g/dL — ABNORMAL LOW (ref 13.0–17.0)
MCH: 28.7 pg (ref 26.0–34.0)
MCHC: 33.2 g/dL (ref 30.0–36.0)
MCV: 86.2 fL (ref 80.0–100.0)
Platelets: 259 10*3/uL (ref 150–400)
RBC: 3.56 MIL/uL — ABNORMAL LOW (ref 4.22–5.81)
RDW: 15.7 % — ABNORMAL HIGH (ref 11.5–15.5)
WBC: 7 10*3/uL (ref 4.0–10.5)
nRBC: 0 % (ref 0.0–0.2)

## 2023-07-18 LAB — HEMOGLOBIN A1C
Hgb A1c MFr Bld: 5.2 % (ref 4.8–5.6)
Mean Plasma Glucose: 102.54 mg/dL

## 2023-07-18 LAB — GLUCOSE, CAPILLARY
Glucose-Capillary: 111 mg/dL — ABNORMAL HIGH (ref 70–99)
Glucose-Capillary: 134 mg/dL — ABNORMAL HIGH (ref 70–99)
Glucose-Capillary: 128 mg/dL — ABNORMAL HIGH (ref 70–99)
Glucose-Capillary: 130 mg/dL — ABNORMAL HIGH (ref 70–99)

## 2023-07-18 LAB — MAGNESIUM: Magnesium: 1.9 mg/dL (ref 1.7–2.4)

## 2023-07-18 MED ORDER — COSYNTROPIN 0.25 MG IJ SOLR
0.2500 mg | Freq: Once | INTRAMUSCULAR | Status: AC
  Administered 2023-07-19: 0.25 mg via INTRAVENOUS
  Filled 2023-07-18: qty 0.25

## 2023-07-18 MED ORDER — SODIUM CHLORIDE 0.9% FLUSH: 10.0000 mL | INTRAVENOUS | Status: DC | PRN

## 2023-07-18 MED ORDER — CHLORHEXIDINE GLUCONATE CLOTH 2 % EX PADS
6.0000 | MEDICATED_PAD | Freq: Every day | CUTANEOUS | Status: DC
  Administered 2023-07-18 – 2023-07-22 (×5): 6 via TOPICAL

## 2023-07-18 MED ORDER — SODIUM CHLORIDE 0.9% FLUSH
10.0000 mL | Freq: Two times a day (BID) | INTRAVENOUS | Status: DC
  Administered 2023-07-18 – 2023-07-22 (×9): 10 mL

## 2023-07-18 MED ORDER — SERTRALINE HCL 50 MG PO TABS
25.0000 mg | ORAL_TABLET | Freq: Every day | ORAL | Status: DC
  Administered 2023-07-18 – 2023-07-19 (×2): 25 mg via ORAL
  Filled 2023-07-18 (×2): qty 1

## 2023-07-18 NOTE — Evaluation (Signed)
 Physical Therapy Evaluation Patient Details Name: Harold [name] MRN: [id number] DOB: 04/05/1955 Today's Date: 07/18/2023  History of Present Illness  Pt is a 68 y/o male admitted from SNF rehab with reocurring orthostatic hypotension.  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  Clinical Impression  Pt admitted with/for hypotension which pt states has reocurred and worsened for ~3 years.  Pt overall needs mod assist for basic bed mobility and standing at EOB. Pt currently limited functionally due to the problems listed. ( See problems list.)   Pt will benefit from PT to maximize function and safety in order to get ready for next venue listed below.         If plan is discharge home, recommend the following: Two people to help with walking and/or transfers;A lot of help with bathing/dressing/bathroom;Assistance with cooking/housework;Assist for transportation;Help with stairs or ramp for entrance   Can travel by private vehicle   No    Equipment Recommendations  (TBA)  Recommendations for Other Services       Functional Status Assessment Patient has had a recent decline in their functional status and demonstrates the ability to make significant improvements in function in a reasonable and predictable amount of time.     Precautions / Restrictions Precautions Precautions: Fall Recall of Precautions/Restrictions: Intact Restrictions RLE Weight Bearing Per Provider Order: Weight bearing as tolerated      Mobility  Bed Mobility Overal bed mobility: Needs Assistance Bed Mobility: Rolling, Sidelying to Sit, Sit to Supine Rolling: Min assist, Used rails Sidelying to sit: Mod assist Supine to sit: Min assist, Mod assist     General bed mobility comments: significant  difficulty getting his body in a good biomechanical position to push up to sitting.    Transfers Overall transfer level: Needs assistance Equipment used: Rolling walker (2 wheels) Transfers: Sit to/from Stand Sit to Stand: Mod assist           General transfer comment: due to difficult sit to stand with orthostatic hypotension, aborted pivot transfer to a chair.  Pt unable to side step R toward [name] with mod+ assist    Ambulation/Gait                  Stairs            Wheelchair Mobility     Tilt Bed    Modified Rankin (Stroke Patients Only)       Balance Overall balance assessment: Needs assistance Sitting-balance support: Bilateral upper extremity supported, Feet supported, Feet unsupported Sitting balance-Leahy Scale: Poor Sitting balance - Comments: reliant on UE Postural control: Posterior lean Standing balance support: Bilateral upper extremity supported, During functional activity, Reliant on assistive device for balance Standing balance-Leahy Scale: Poor Standing balance comment: Reliant on RW, with posterior bias.                             Pertinent Vitals/Pain Pain Assessment Pain Intervention(s): Monitored during session    Home Living Family/patient expects to be discharged to:: Private residence Living Arrangements: Spouse/significant other Available Help at Discharge: Family;Available 24 hours/day Type of Home: House Home Access: Ramped entrance       Home Layout: One level Home Equipment: Pharmacist, hospital (2 wheels);Transport chair;Grab bars - toilet;Grab bars -  tub/shower;BSC/3in1;Wheelchair - manual Additional Comments: Pt reports spouse works from home and can assist as needed    Prior Function Prior Level of Function : History of Falls (last six months);Needs assist             Mobility Comments: sleeps in recliner; working on stand pivot transfers using walker with HHPT; able to do some with  spouse; many falls, reports episodes of syncope; reports dealing with R ankle injury since 12/2022 ADLs Comments: Husband helps with toileting (BSC), LB dressing. sponge bathing. w/c won't fit through doorways. HHRN 1-2x/wk for 2-3 hours to change bandage and washing     Extremity/Trunk Assessment   Upper Extremity Assessment Upper Extremity Assessment: Overall WFL for tasks assessed (bil general weakness)    Lower Extremity Assessment Lower Extremity Assessment: Generalized weakness RLE Deficits / Details: grossly 3/5       Communication   Communication Communication: No apparent difficulties    Cognition Arousal: Alert Behavior During Therapy: WFL for tasks assessed/performed   PT - Cognitive impairments: No family/caregiver present to determine baseline                         Following commands: Intact       Cueing Cueing Techniques: Verbal cues     General Comments General comments (skin integrity, edema, etc.): BP sitting EOB 80//59,  lying 106/72,  unable to attain a standing BP due to cuff on L calf.    Exercises     Assessment/Plan    PT Assessment Patient needs continued PT services  PT Problem List Decreased strength;Decreased range of motion;Decreased activity tolerance;Decreased balance;Decreased mobility;Pain;Obesity       PT Treatment Interventions DME instruction;Gait training;Stair training;Functional mobility training;Therapeutic activities;Therapeutic exercise;Balance training;Patient/family education;Wheelchair mobility training;Cognitive remediation    PT Goals (Current goals can be found in the Care Plan section)  Acute Rehab PT Goals Patient Stated Goal: return home, decreased pain PT Goal Formulation: With patient Time For Goal Achievement: 08/01/23 Potential to Achieve Goals: Fair    Frequency       Co-evaluation               AM-PAC PT "6 Clicks" Mobility  Outcome Measure Help needed turning from your back to  your side while in a flat bed without using bedrails?: A Little Help needed moving from lying on your back to sitting on the side of a flat bed without using bedrails?: A Lot Help needed moving to and from a bed to a chair (including a wheelchair)?: A Lot Help needed standing up from a chair using your arms (e.g., wheelchair or bedside chair)?: Total Help needed to walk in hospital room?: Total Help needed climbing 3-5 steps with a railing? : Total 6 Click Score: 10    End of Session   Activity Tolerance: Treatment limited secondary to medical complications (Comment) (orthostasis) Patient left: in bed;with call bell/phone within reach Nurse Communication: Mobility status PT Visit Diagnosis: Other abnormalities of gait and mobility (R26.89);Pain Pain - Right/Left: Right Pain - part of body: Ankle and joints of foot    Time: 1610-9604 (1656) PT Time Calculation (min) (ACUTE ONLY): 23 min   Charges:   PT Evaluation $PT Eval Moderate Complexity: 1 Mod PT Treatments $Therapeutic Activity: 8-22 mins PT General Charges $$ ACUTE PT VISIT: 1 Visit         07/18/2023  Nohemi Batters., PT Acute Rehabilitation Services [phone]  (office)  Durell Gilding Jiovani Mccammon 07/18/2023, 5:30  PM

## 2023-07-18 NOTE — Care Management Obs Status (Signed)
 MEDICARE OBSERVATION STATUS NOTIFICATION   Patient Details  Name: Harold [name] MRN: [id number] Date of Birth: 29-Jul-1955   Medicare Observation Status Notification Given:       Janith Melnick 07/18/2023, 9:50 AM

## 2023-07-18 NOTE — TOC Initial Note (Signed)
 Transition of Care [facility]) - Initial/Assessment Note    Patient Details  Name: Harold [name] MRN: [id number] Date of Birth: 1956-02-09  [facility] of [facility] CM/SW Contact:    Arron Big, LCSWA Phone Number: 07/18/2023, 3:09 PM  Clinical Narrative:      Patient from [facility] and can return but will need a another pre authorization.   TOC will continue to follow.               Expected Discharge Plan: Skilled Nursing Facility Barriers to Discharge: Continued Medical Work up, English as a second language teacher   Patient Goals and CMS Choice Patient states their goals for this hospitalization and ongoing recovery are:: Return to SNF          Expected Discharge Plan and Services In-house Referral: Clinical Social Work     Living arrangements for the past 2 months: Skilled Holiday representative                                      Prior Living Arrangements/Services Living arrangements for the past 2 months: Skilled Nursing Facility Lives with:: Facility Resident Patient language and need for interpreter reviewed:: Yes Do you feel safe going back to the place where you live?: Yes      Need for Family Participation in Patient Care: Yes (Comment) Care giver support system in place?: Yes (comment)   Criminal Activity/Legal Involvement Pertinent to Current Situation/Hospitalization: No - Comment as needed  Activities of Daily Living   ADL Screening (condition at time of admission) Independently performs ADLs?: No Does the patient have a NEW difficulty with bathing/dressing/toileting/self-feeding that is expected to last >3 days?: Yes (Initiates electronic notice to provider for possible OT consult) Does the patient have a NEW difficulty with getting in/out of bed, walking, or climbing stairs that is expected to last >3 days?: Yes (Initiates electronic notice to provider for possible PT consult) Does the patient have a NEW difficulty with communication that is expected to  last >3 days?: No Is the patient deaf or have difficulty hearing?: No Does the patient have difficulty seeing, even when wearing glasses/contacts?: No Does the patient have difficulty concentrating, remembering, or making decisions?: No  Permission Sought/Granted Permission sought to share information with : Facility Medical sales representative, Family Supports Permission granted to share information with : Yes, Verbal Permission Granted  Share Information with NAME: Benton-Elliot,George (Spouse) [phone] (Mobile)  Permission granted to share info w AGENCY: SNF        Emotional Assessment Appearance:: Appears stated age Attitude/Demeanor/Rapport: Engaged Affect (typically observed): Unable to Assess Orientation: : Oriented to Self, Oriented to Place, Oriented to  Time, Oriented to Situation Alcohol / Substance Use: Not Applicable Psych Involvement: No (comment)  Admission diagnosis:  Hypotension [I95.9] Hypotension, unspecified hypotension type [I95.9] Patient Active Problem List   Diagnosis Date Noted   PICC (peripherally inserted central catheter) in place 07/18/2023   Dizziness 07/17/2023   Hypotension 07/17/2023   Hardware complicating wound infection (HCC) 06/19/2023   Hyponatremia 06/19/2023   Open wound of right ankle 06/18/2023   Medication management 06/18/2023   Protein-calorie malnutrition, severe 05/21/2023   Open right ankle fracture, sequela 05/12/2023   Obesity, Class II, BMI 35-39.9 04/23/2023   Osteomyelitis (HCC) 04/17/2023   Cellulitis of right lower extremity 03/28/2023   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, right (HCC) 03/28/2023   Abscess of skin of right ankle 03/28/2023  PAD (peripheral artery disease) (HCC) 03/28/2023   Vitamin D  deficiency 01/07/2023   Prolonged QT interval 01/02/2023   Acute kidney injury superimposed on stage 4 chronic kidney disease (HCC) 01/02/2023   Normocytic anemia 01/02/2023   Paroxysmal atrial fibrillation (HCC)  01/02/2023   Uncontrolled type 2 diabetes mellitus with hyperglycemia, with long-term current use of insulin  (HCC) 01/02/2023   Acquired hypothyroidism 01/02/2023   CKD stage 3b, GFR 30-44 ml/min (HCC) 11/07/2022   CAD in native artery 11/07/2022   PVD (peripheral vascular disease) (HCC) 11/06/2022   Hypercoagulable state due to persistent atrial fibrillation (HCC) 02/19/2022   Persistent atrial fibrillation (HCC) 01/28/2022   Lumbar stenosis with neurogenic claudication 07/26/2020   Diabetic neuropathy (HCC) 08/24/2019   Trigger thumb of left hand 09/02/2018   Atherosclerosis of native artery of both lower extremities with intermittent claudication (HCC) 05/12/2018   Pain of left hand 03/26/2018   Trigger finger 03/03/2017   Pain in finger of left hand 03/03/2017   Snoring 08/20/2016   Morbid (severe) obesity due to excess calories (HCC) 04/18/2016   Venous stasis of both lower extremities - with edema 02/25/2015   Right-sided chest wall pain 05/08/2014   Gastroesophageal reflux disease without esophagitis 10/10/2013   Chronic heart failure with preserved ejection fraction (HFpEF) (HCC) 06/27/2013   COPD (chronic obstructive pulmonary disease) (HCC) 06/17/2013   Restrictive lung disease 06/17/2013   Obesity, Class III, BMI 40-49.9 (morbid obesity) 09/01/2012   Generalized weakness 09/01/2012   Chronic renal disease, stage IV (HCC) 04/30/2012   OSA on CPAP 04/29/2012   Pulmonary hypertension, 04/29/2012   Dyspnea on exertion   04/26/2012   CAD, LAD/CFX DES 04/28/12- staged RCA DES 04/29/12 after pt declined CABG, cath 05/14/13 stable CAD medical therapy    Carotid artery stenosis without cerebral infarction, bilateral 11/07/2011   Diabetes mellitus type 2 with neurological manifestations (HCC) 04/29/2011   Essential hypertension 04/29/2011   Neuropathy 04/29/2011   Hyperlipidemia associated with type 2 diabetes mellitus (HCC) 04/29/2011   Anxiety and depression 04/29/2011    PCP:  Tita Form, MD Pharmacy:   [facility] [phone] - [place], [place] - [address] [place] [address] Phone: [phone] Fax: [phone]     Social Drivers of Health (SDOH) Social History: SDOH Screenings   Food Insecurity: No Food Insecurity (07/17/2023)  Housing: Low Risk  (07/17/2023)  Transportation Needs: No Transportation Needs (07/17/2023)  Utilities: Not At Risk (07/17/2023)  Depression (PHQ2-9): Low Risk  (07/17/2023)  Social Connections: Moderately Isolated (07/17/2023)  Tobacco Use: Medium Risk (07/17/2023)   SDOH Interventions:     Readmission Risk Interventions     No data to display

## 2023-07-18 NOTE — Progress Notes (Signed)
 Progress Note   Patient: Harold [name]:[id number] DOB: Jul 01, 1955 DOA: 07/17/2023     0 DOS: the patient was seen and examined on 07/18/2023   Brief hospital course: Harold [name] is a 68 y.o. male with past medical history  of  coronary artery disease status post PCI, chronic kidney disease stage IV, COPD, PAH, OSA, diabetes mellitus with neuropathy, chronic venous insufficiency, orthostatic hypotension, hyperlipidemia, diabetic foot ulcer, traumatic right ankle fracture on 12/2022 requiring hardware placement complicated by need for multiple surgeries and infection with Enterobacter cloacae bacteremia and recent right ankle arthrodesis on 05/15/2023 currently being followed by infectious disease and saw them today and drainage has resolved. The patient was sent to the ED from ID office due to hypotension. The patient apparently had passed out in the days previous to admission. She states that she has had intermittent issues with lightheadedness since November of last year. She was given florinef and midodrine  in the ED with resultant hypertension. Florinef has not been continued. The patient's most recent echocardiogram was performed in 03/2023 and demonstrated an Ef of 55-60% with normal function and regional wall motion abnormalities with basal inferior hypokinesis. He has Grade II diastolic dysfunction. There is normal RV systolic function with mildly enlarged RV size and mildly elevated pulmonary artery systolic pressure. RVSP was 38.3 mmHg. There is moderately dilated left atrial size and mildly dilated right atrial size.  DDx includes autonomic neuropathy due to his longstanding history of diabetes; adrenal insufficiency, sepsis, PE, Medications.  Cosyntropin stimulation test has been ordered.  Infectious disease has been consulted.  Assessment and Plan:  Hypotension/Syncope Differentials include: Autonomic neuropathy-secondary to his longstanding history of diabetes, will  continue with midodrine . Florinef has been discontinued. Will follow orthostatics. Monitor blood sugars.  Sepsis related to the patient's chronic infection of ankle. Blood cultures are pending. D Dimer was positive, but PE is unllikely given the patient's ongoing anticoagulation with eliquis . He is continued on his chronic ertapenem  and daptomycin . WBC is within normal range. Infectious disease input is appreciated.   Medication related-in this patient I would definitely avoid medications that can cause contribute or worsen his blood pressure.  Patient is currently on Cymbalta  60 mg which I will change to 40 mg dosing currently, Flomax  will be stopped as it is known for causing orthostatic hypotension.  Intermittent use of diuretics may also play a role. Will hold for now.   Currently we will hold his blood pressure medications and continue with midodrine  and Florinef.  Will order a current echocardiogram to rule out a change in EF and cardiac output as a cause for hypotension and syncope.   Depression:  The patient is complaining of depression. Will consult psychiatry.   Hypothyroidism: Continue levothyroxine . TSH was 6.242, but T4 was in normal range at 0.83. This is likely due to non-thyroidal illness.  Diabetes mellitus type 2: Consistent carb diet, glycemic protocol and sliding scale insulin  regimen management.   OSA on CPAP/ pulmonary HTN: Cont cpap at night per home settings.    PAD: Patient is to have a repeat ABI and lower extremity arterial duplex per cardiology note.   Persistent A-fib: Currently in sinus rhythm on exam patient is on amiodarone  which we will continue along with Eliquis .   BPH: Flomax  held   Rt Ankle hardware infection: Pharmacy consult  to continue  ertapenem  and daptomycin . Infectious disease input is appreciated.   I have seen and examined this patient myself. I have spent 36 minutes in his  evaluation and care.      Subjective: The patient is  resting comfortably. No new complaints.   Physical Exam: Vitals:   07/18/23 0926 07/18/23 0927 07/18/23 1122 07/18/23 1553  BP: (!) 126/47 (!) 126/47 121/77 111/75  Pulse: 71  70 61  Resp:   18 18  Temp: 97.6 F (36.4 C) 97.6 F (36.4 C)  97.8 F (36.6 C)  TempSrc: Oral Oral  Oral  SpO2: 100%  99% 100%  Weight:      Height:       Exam:  Constitutional:  The patient is awake, alert, and oriented x 3. No acute distress. Respiratory:  No increased work of breathing. No wheezes, rales, or rhonchi No tactile fremitus Cardiovascular:  Regular rate and rhythm No murmurs, ectopy, or gallups. No lateral PMI. No thrills. Abdomen:  Abdomen is soft, non-tender, non-distended No hernias, masses, or organomegaly Normoactive bowel sounds.  Musculoskeletal:  No cyanosis, clubbing, or edema Skin:  No rashes, lesions, ulcers palpation of skin: no induration or nodules Neurologic:  CN 2-12 intact Sensation all 4 extremities intact Psychiatric:  Mental status Mood, affect appropriate Orientation to person, place, time  judgment and insight appear intact  Data Reviewed:  CBC BMP  Family Communication: None available  Disposition: Status is: Observation The patient remains OBS appropriate and will d/c before 2 midnights.  Planned Discharge Destination: Home    Time spent: 35 minutes  Author: Keva Darty, DO 07/18/2023 6:05 PM  For on call review [email or web address].

## 2023-07-18 NOTE — Progress Notes (Addendum)
 I spoke to patient's spouse per his request on 5/29 regarding patient being sent to ED after clinic visit today. Told him his BP was hanging in 80s/50s after taking it few times as well patient complained of dizziness while in the room and informed CMA about h/o passing out at the facility last week. I had discussed with the patient regarding sending to ED and he agreed. Explained to spouse these were concerning enough to send him to ED to make sure nothing else significant is going on even though he has h/o hypotension.   He reported h/o low BP/severe hypotension and had recently seen Cardiology and was started on midodrine  but it has not been started yet.   Spouse is worried about the cost of ED visit and wants h/o chronic hypotension to be placed in chart and possibly call him if in future any needs to send him to the hospital.  I told him his rt foot looks has no acute signs of infection and make a fu before his end date of antibiotics in 6/14 pending ED disposition.   Terre Ferri, MD Infectious Disease Physician [facility] [address] [place], [place] [address] Phone: [phone]  Fax: [phone]'

## 2023-07-18 NOTE — Plan of Care (Signed)
  Problem: Education: Goal: Knowledge of General Education information will improve Description: Including pain rating scale, medication(s)/side effects and non-pharmacologic comfort measures Outcome: Progressing   Problem: Health Behavior/Discharge Planning: Goal: Ability to manage health-related needs will improve Outcome: Progressing   Problem: Clinical Measurements: Goal: Ability to maintain clinical measurements within normal limits will improve Outcome: Progressing Goal: Will remain free from infection Outcome: Progressing Goal: Diagnostic test results will improve Outcome: Progressing Goal: Respiratory complications will improve Outcome: Progressing Goal: Cardiovascular complication will be avoided Outcome: Progressing   Problem: Activity: Goal: Risk for activity intolerance will decrease Outcome: Progressing   Problem: Nutrition: Goal: Adequate nutrition will be maintained Outcome: Progressing   Problem: Coping: Goal: Level of anxiety will decrease Outcome: Progressing   Problem: Elimination: Goal: Will not experience complications related to bowel motility Outcome: Progressing Goal: Will not experience complications related to urinary retention Outcome: Progressing   Problem: Pain Managment: Goal: General experience of comfort will improve and/or be controlled Outcome: Progressing   Problem: Safety: Goal: Ability to remain free from injury will improve Outcome: Progressing   Problem: Skin Integrity: Goal: Risk for impaired skin integrity will decrease Outcome: Progressing   Problem: Education: Goal: Ability to describe self-care measures that may prevent or decrease complications (Diabetes Survival Skills Education) will improve Outcome: Progressing Goal: Individualized Educational Video(s) Outcome: Progressing   Problem: Coping: Goal: Ability to adjust to condition or change in health will improve Outcome: Progressing   Problem: Fluid  Volume: Goal: Ability to maintain a balanced intake and output will improve Outcome: Progressing   Problem: Health Behavior/Discharge Planning: Goal: Ability to identify and utilize available resources and services will improve Outcome: Progressing Goal: Ability to manage health-related needs will improve Outcome: Progressing   Problem: Metabolic: Goal: Ability to maintain appropriate glucose levels will improve Outcome: Progressing   Problem: Nutritional: Goal: Maintenance of adequate nutrition will improve Outcome: Progressing Goal: Progress toward achieving an optimal weight will improve Outcome: Progressing   Problem: Tissue Perfusion: Goal: Adequacy of tissue perfusion will improve Outcome: Progressing   Problem: Skin Integrity: Goal: Risk for impaired skin integrity will decrease Outcome: Not Progressing

## 2023-07-19 DIAGNOSIS — I959 Hypotension, unspecified: Secondary | ICD-10-CM | POA: Diagnosis not present

## 2023-07-19 DIAGNOSIS — F331 Major depressive disorder, recurrent, moderate: Secondary | ICD-10-CM | POA: Diagnosis present

## 2023-07-19 LAB — BASIC METABOLIC PANEL WITH GFR
Anion gap: 11 (ref 5–15)
BUN: 18 mg/dL (ref 8–23)
CO2: 22 mmol/L (ref 22–32)
Calcium: 8.1 mg/dL — ABNORMAL LOW (ref 8.9–10.3)
Chloride: 104 mmol/L (ref 98–111)
Creatinine, Ser: 2.03 mg/dL — ABNORMAL HIGH (ref 0.61–1.24)
GFR, Estimated: 35 mL/min — ABNORMAL LOW (ref >60)
Glucose, Bld: 149 mg/dL — ABNORMAL HIGH (ref 70–99)
Potassium: 3.2 mmol/L — ABNORMAL LOW (ref 3.5–5.1)
Sodium: 137 mmol/L (ref 135–145)

## 2023-07-19 LAB — GLUCOSE, CAPILLARY
Glucose-Capillary: 104 mg/dL — ABNORMAL HIGH (ref 70–99)
Glucose-Capillary: 145 mg/dL — ABNORMAL HIGH (ref 70–99)
Glucose-Capillary: 169 mg/dL — ABNORMAL HIGH (ref 70–99)
Glucose-Capillary: 110 mg/dL — ABNORMAL HIGH (ref 70–99)

## 2023-07-19 LAB — CBC WITH DIFFERENTIAL/PLATELET
Abs Immature Granulocytes: 0.03 10*3/uL (ref 0.00–0.07)
Basophils Absolute: 0.1 10*3/uL (ref 0.0–0.1)
Basophils Relative: 1 %
Eosinophils Absolute: 0.1 10*3/uL (ref 0.0–0.5)
Eosinophils Relative: 2 %
HCT: 28.7 % — ABNORMAL LOW (ref 39.0–52.0)
Hemoglobin: 9.7 g/dL — ABNORMAL LOW (ref 13.0–17.0)
Immature Granulocytes: 1 %
Lymphocytes Relative: 16 %
Lymphs Abs: 0.9 10*3/uL (ref 0.7–4.0)
MCH: 29 pg (ref 26.0–34.0)
MCHC: 33.8 g/dL (ref 30.0–36.0)
MCV: 85.7 fL (ref 80.0–100.0)
Monocytes Absolute: 0.2 10*3/uL (ref 0.1–1.0)
Monocytes Relative: 4 %
Neutro Abs: 4.3 10*3/uL (ref 1.7–7.7)
Neutrophils Relative %: 76 %
Platelets: 194 10*3/uL (ref 150–400)
RBC: 3.35 MIL/uL — ABNORMAL LOW (ref 4.22–5.81)
RDW: 15.7 % — ABNORMAL HIGH (ref 11.5–15.5)
WBC: 5.6 10*3/uL (ref 4.0–10.5)
nRBC: 0 % (ref 0.0–0.2)

## 2023-07-19 LAB — ACTH STIMULATION, 3 TIME POINTS
Cortisol, 30 Min: 21.1 ug/dL
Cortisol, 60 Min: 27.4 ug/dL
Cortisol, Base: 9.8 ug/dL

## 2023-07-19 MED ORDER — GABAPENTIN 100 MG PO CAPS
100.0000 mg | ORAL_CAPSULE | Freq: Three times a day (TID) | ORAL | Status: DC
  Administered 2023-07-19 – 2023-07-22 (×10): 100 mg via ORAL
  Filled 2023-07-19 (×10): qty 1

## 2023-07-19 MED ORDER — DULOXETINE HCL 60 MG PO CPEP: 60.0000 mg | ORAL_CAPSULE | Freq: Every day | ORAL | Status: DC

## 2023-07-19 NOTE — Progress Notes (Signed)
 Progress Note   Patient: Harold [name] OZH:[id number] DOB: 10-19-1955 DOA: 07/17/2023     0 DOS: the patient was seen and examined on 07/19/2023   Brief hospital course: SHERLEY LESER is a 68 y.o. male with past medical history  of  coronary artery disease status post PCI, chronic kidney disease stage IV, COPD, PAH, OSA, diabetes mellitus with neuropathy, chronic venous insufficiency, orthostatic hypotension, hyperlipidemia, diabetic foot ulcer, traumatic right ankle fracture on 12/2022 requiring hardware placement complicated by need for multiple surgeries and infection with Enterobacter cloacae bacteremia and recent right ankle arthrodesis on 05/15/2023 currently being followed by infectious disease and saw them today and drainage has resolved. The patient was sent to the ED from ID office due to hypotension. The patient apparently had passed out in the days previous to admission. She states that she has had intermittent issues with lightheadedness since November of last year. She was given florinef  and midodrine  in the ED with resultant hypertension. Florinef  has not been continued. The patient's most recent echocardiogram was performed in 03/2023 and demonstrated an Ef of 55-60% with normal function and regional wall motion abnormalities with basal inferior hypokinesis. He has Grade II diastolic dysfunction. There is normal RV systolic function with mildly enlarged RV size and mildly elevated pulmonary artery systolic pressure. RVSP was 38.3 mmHg. There is moderately dilated left atrial size and mildly dilated right atrial size.  DDx includes autonomic neuropathy due to his longstanding history of diabetes; adrenal insufficiency, sepsis, PE, Medications.  Cosyntropin  stimulation test was negative. Infectious disease has been consulted.  Orthostatic vitals ordered, none recorded as or yet. Reordered per shift.   One episode of hypotension on 07/18/2023 1300-1500 . None recorded  since.  Continue midodrine .  Assessment and Plan:  Hypotension/Syncope Differentials include: Autonomic neuropathy-secondary to his longstanding history of diabetes, will continue with midodrine . Florinef  has been discontinued. Will follow orthostatics. Monitor blood sugars. Midodrine  continued.  Adrenal Insufficiency - ruled out with normal cosyntropin  stim test  Sepsis related to the patient's chronic infection of ankle. Blood cultures are pending. D Dimer was positive, but PE is unllikely given the patient's ongoing anticoagulation with eliquis . He is continued on his chronic ertapenem  and daptomycin . WBC is within normal range. Infectious disease input is appreciated. - Unlikely ID consulted. Dr. Gillian Lacrosse has stated that his foot has no acute signs of infection. He states only that he wants for the patien to follow up with them in their office before then end date of his antibiotics on 08/02/2023.   Medication related-in this patient I would definitely avoid medications that can cause contribute or worsen his blood pressure.  Patient is currently on Cymbalta  60 mg which I will change to 40 mg dosing currently, Flomax  will be stopped as it is known for causing orthostatic hypotension.  Intermittent use of diuretics may also play a role. Will hold for now.   Currently we will hold his blood pressure medications and continue with midodrine . Florinef  has been discontinued.  Will order a current echocardiogram to rule out a change in EF and cardiac output as a cause for hypotension and syncope.   Depression:  The patient is complaining of depression. Psychiatry consulted. They have increased his Cymbalta  to 50 mg daily. Zoloft  25 mg has been discontinued. Gabapentin  100 mg tid has been started for anxiety. He does not meet criteria for inpatient treatment, and is not considered a danger to himself or others.   Hypothyroidism: Continue levothyroxine . TSH was 6.242, but T4  was in normal range at  0.83. This is likely due to non-thyroidal illness.  Diabetes mellitus type 2: Consistent carb diet, glycemic protocol and sliding scale insulin  regimen management.   OSA on CPAP/ pulmonary HTN: Cont cpap at night per home settings.    PAD: Patient is to have a repeat ABI and lower extremity arterial duplex per cardiology note.   Persistent A-fib: Currently in sinus rhythm on exam patient is on amiodarone  which we will continue along with Eliquis .   BPH: Flomax  held   Rt Ankle hardware infection: Pharmacy consult  to continue  ertapenem  and daptomycin . Infectious disease input is appreciated.   I have seen and examined this patient myself. I have spent 34 minutes in his evaluation and care.      Subjective: The patient is resting comfortably. No new complaints.   Physical Exam: Vitals:   07/19/23 0348 07/19/23 0750 07/19/23 1149 07/19/23 1645  BP: 127/88 (!) 144/75 128/69 (!) 158/89  Pulse: 65 68 71 69  Resp: 18 18 20 18   Temp: 97.6 F (36.4 C) 97.7 F (36.5 C) 97.8 F (36.6 C) 97.7 F (36.5 C)  TempSrc: Oral Oral Oral Oral  SpO2: 100% 100% 99% 100%  Weight: 102.8 kg     Height:       Exam:  Constitutional:  The patient is awake, alert, and oriented x 3. No acute distress. Respiratory:  No increased work of breathing. No wheezes, rales, or rhonchi No tactile fremitus Cardiovascular:  Regular rate and rhythm No murmurs, ectopy, or gallups. No lateral PMI. No thrills. Abdomen:  Abdomen is soft, non-tender, non-distended No hernias, masses, or organomegaly Normoactive bowel sounds.  Musculoskeletal:  No cyanosis, clubbing, or edema Skin:  No rashes, lesions, ulcers palpation of skin: no induration or nodules Neurologic:  CN 2-12 intact Sensation all 4 extremities intact Psychiatric:  Mental status Mood, affect appropriate Orientation to person, place, time  judgment and insight appear intact  Data Reviewed:  CBC BMP  Family Communication: None  available  Disposition: Status is: Observation The patient remains OBS appropriate and will d/c before 2 midnights.  Planned Discharge Destination: Home    Time spent: 35 minutes  Author: Malavika Lira, DO 07/19/2023 6:57 PM  For on call review [email or web address].

## 2023-07-19 NOTE — Evaluation (Signed)
 Occupational Therapy Evaluation Patient Details Name: Harold [name] MRN: [id number] DOB: 09/20/1955 Today's Date: 07/19/2023   History of Present Illness   Pt is a 68 y/o male admitted from SNF rehab with reocurring orthostatic hypotension.  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     Clinical Impressions Pt reports having assist at baseline for ADLs at bed or chair level, has been able to pivot to w/c and propel self at SNF. Pt currently needing up to max A for ADLs, mod A for transfers with RW. Pt stands briefly for pillow placement but needed to sit quickly due to orthostatic BP, BP taken seated 101/75 (84). Pt's R heel bleeding and RN present at end of session to bandage. Pt presenting with impairments listed below, will follow acutely. Patient will benefit from continued inpatient follow up therapy, <3 hours/day to maximize safety/ind with ADL/functional mobility.      If plan is discharge home, recommend the following:   Two people to help with walking and/or transfers;A lot of help with bathing/dressing/bathroom;Assistance with cooking/housework;Assist for transportation;Help with stairs or ramp for entrance     Functional Status Assessment   Patient has had a recent decline in their functional status and demonstrates the ability to make significant improvements in function in a reasonable and predictable amount of time.     Equipment Recommendations   Other (comment) (defer)     Recommendations for Other Services   PT consult     Precautions/Restrictions   Precautions Precautions: Fall Recall of Precautions/Restrictions: Intact Restrictions Weight Bearing Restrictions Per Provider Order: Yes RLE Weight Bearing Per Provider Order: Weight bearing as  tolerated     Mobility Bed Mobility               General bed mobility comments: OOB in chair upon arrival and departure    Transfers Overall transfer level: Needs assistance Equipment used: Rolling walker (2 wheels) Transfers: Sit to/from Stand Sit to Stand: Mod assist           General transfer comment: stands briefly for pillow placement, sits quickly due to orthostatic hypotension      Balance Overall balance assessment: Needs assistance Sitting-balance support: Bilateral upper extremity supported, Feet supported, Feet unsupported Sitting balance-Leahy Scale: Fair     Standing balance support: Bilateral upper extremity supported, During functional activity, Reliant on assistive device for balance Standing balance-Leahy Scale: Poor Standing balance comment: Reliant on RW, with posterior bias.                           ADL either performed or assessed with clinical judgement   ADL Overall ADL's : Needs assistance/impaired Eating/Feeding: Set up;Sitting   Grooming: Set up;Sitting   Upper Body Bathing: Moderate assistance;Sitting   Lower Body Bathing: Maximal assistance;Sitting/lateral leans;Sit to/from stand   Upper Body Dressing : Moderate assistance;Sitting   Lower Body Dressing: Maximal assistance;Sitting/lateral leans;Sit to/from stand   Toilet Transfer: Moderate assistance   Toileting- Clothing Manipulation and Hygiene: Maximal assistance       Functional mobility during ADLs: Moderate assistance       Vision Baseline Vision/History: 1 Wears glasses Vision Assessment?: No apparent visual deficits     Perception Perception: Not tested       Praxis Praxis: Not tested  Pertinent Vitals/Pain Pain Assessment Pain Assessment: Faces Pain Score: 4  Faces Pain Scale: Hurts little more Pain Location: R ankle Pain Descriptors / Indicators: Sore Pain Intervention(s): Limited activity within patient's tolerance, Monitored  during session, Repositioned     Extremity/Trunk Assessment Upper Extremity Assessment Upper Extremity Assessment: Generalized weakness   Lower Extremity Assessment Lower Extremity Assessment: Defer to PT evaluation   Cervical / Trunk Assessment Cervical / Trunk Assessment: Normal   Communication Communication Communication: No apparent difficulties   Cognition Arousal: Alert Behavior During Therapy: WFL for tasks assessed/performed Cognition: No apparent impairments                               Following commands: Intact       Cueing  General Comments   Cueing Techniques: Verbal cues  BP 101/75 (84) seated at end of session   Exercises     Shoulder Instructions      Home Living Family/patient expects to be discharged to:: Skilled nursing facility                                 Additional Comments: was at [facility] in [place]      Prior Functioning/Environment               Mobility Comments: was pivoting to w/c at rehab, ADLs Comments: pivoting to [facility], assist for bathing/dressing at bed/chair level    OT Problem List: Decreased strength;Decreased range of motion;Decreased activity tolerance;Impaired balance (sitting and/or standing);Pain;Obesity   OT Treatment/Interventions: Therapeutic exercise;Self-care/ADL training;Energy conservation;DME and/or AE instruction;Therapeutic activities;Patient/family education;Balance training      OT Goals(Current goals can be found in the care plan section)   Acute Rehab OT Goals Patient Stated Goal: none stated OT Goal Formulation: With patient Time For Goal Achievement: 08/02/23 Potential to Achieve Goals: Good ADL Goals Pt Will Perform Grooming: Independently;sitting Pt Will Perform Upper Body Dressing: with modified independence;sitting Pt Will Perform Lower Body Dressing: with modified independence;sitting/lateral leans;sit to/from stand Pt Will Transfer to Toilet:  stand pivot transfer;bedside commode;with contact guard assist   OT Frequency:  Min 2X/week    Co-evaluation              AM-PAC OT "6 Clicks" Daily Activity     Outcome Measure Help from another person eating meals?: None Help from another person taking care of personal grooming?: A Little Help from another person toileting, which includes using toliet, bedpan, or urinal?: A Lot Help from another person bathing (including washing, rinsing, drying)?: A Lot Help from another person to put on and taking off regular upper body clothing?: A Lot Help from another person to put on and taking off regular lower body clothing?: A Lot 6 Click Score: 15   End of Session Equipment Utilized During Treatment: Gait belt;Rolling walker (2 wheels) Nurse Communication: Mobility status  Activity Tolerance: Patient limited by fatigue Patient left: in chair;with call bell/phone within reach;with chair alarm set  OT Visit Diagnosis: Unsteadiness on feet (R26.81);Other abnormalities of gait and mobility (R26.89);Muscle weakness (generalized) (M62.81);Repeated falls (R29.6);History of falling (Z91.81);Pain Pain - Right/Left: Right Pain - part of body: Ankle and joints of foot                Time: 1345-1405 OT Time Calculation (min): 20 min Charges:  OT General Charges $OT Visit: 1 Visit OT Evaluation $OT Eval  Moderate Complexity: 1 Mod  Harold [name] K, OTD, OTR/L [employer] Preferred Acute Rehab [phone]   Antionette Kirks 07/19/2023, 2:23 PM

## 2023-07-19 NOTE — Consult Note (Addendum)
 [name] Psychiatric Consult Initial  Patient Name: .Harold [name]  MRN: [id number]  DOB: 01-04-1956  Consult Order details:  Orders (From admission, onward)     Start     Ordered   07/18/23 1803  IP CONSULT TO PSYCHIATRY       Ordering Provider: Junita Oliva, DO  Provider:  (Not yet assigned)  Question Answer Comment  Location [facility]   Reason for Consult? Depression.      07/18/23 1802             Mode of Visit: In person    Psychiatry Consult Evaluation  Service Date: Jul 19, 2023 LOS:  LOS: 0 days  Chief Complaint "Things have been going downhill since November for me" after he broke his ankle and it became infected.  "I gradually lost interest in everything."  Primary Psychiatric Diagnoses  Major depressive disorder, recurrent, moderate  Assessment  Harold [name] is a 68 y.o. male admitted: Medicallyfor 07/17/2023 11:45 AM for  past medical history  of  coronary artery disease status post PCI, chronic kidney disease stage IV, COPD, PAH, OSA, diabetes mellitus with neuropathy, chronic venous insufficiency, orthostatic hypotension, hyperlipidemia, diabetic foot ulcer, traumatic right ankle fracture on 12/2022 requiring hardware placement complicated by need for multiple surgeries and infection with Enterobacter cloacae bacteremia and recent right ankle arthrodesis on 05/15/2023 currently being followed by infectious disease and saw them today and drainage has resolved. Harold [name] He carries the psychiatric diagnoses of depression.  His current presentation of dysphoric mood, anhedonia, low motivation and energy are most consistent with MDD.  Current outpatient psychotropic medications include Zoloft  and Cymbalta  and historically he has had a moderate response to these medications. He was compliant with medications prior to admission as evidenced by self-report. On initial examination, patient was lying in bed in the dark, affect was dysphoric,  cooperative, and pleasant. Please see plan below for detailed recommendations.   Diagnoses:  Active Hospital problems: Principal Problem:   Hypotension Active Problems:   Major depressive disorder, recurrent episode, moderate (HCC)    Plan   ## Psychiatric Medication Recommendations:  Cymbalta  40 mg daily increased to 60 mg daily Zoloft  25 mg daily discontinued Added gabapentin  100 mg TID for anxiety  ## Medical Decision Making Capacity: Not specifically addressed in this encounter  ## Further Work-up:  -- most recent EKG on 07/17/2023 had QtC of 540 -- Pertinent labwork reviewed earlier this admission includes: CBC and diff, chem panel, EKG, urine, toxicology  ## Disposition:-- There are no psychiatric contraindications to discharge at this time  ## Behavioral / Environmental: - No specific recommendations at this time.     ## Safety and Observation Level:  - Based on my clinical evaluation, I estimate the patient to be at low risk of self harm in the current setting. - At this time, we recommend  routine. This decision is based on my review of the chart including patient's history and current presentation, interview of the patient, mental status examination, and consideration of suicide risk including evaluating suicidal ideation, plan, intent, suicidal or self-harm behaviors, risk factors, and protective factors. This judgment is based on our ability to directly address suicide risk, implement suicide prevention strategies, and develop a safety plan while the patient is in the clinical setting. Please contact our team if there is a concern that risk level has changed.  CSSR Risk Category:C-SSRS RISK CATEGORY: No Risk  Suicide Risk Assessment: Patient has following modifiable risk factors for  suicide: under treated depression , which we are addressing by adjusting medications. Patient has following non-modifiable or demographic risk factors for suicide: male gender Patient has  the following protective factors against suicide: Access to outpatient mental health care, Supportive family, Frustration tolerance, no history of suicide attempts, and no history of NSSIB  Thank you for this consult request. Recommendations have been communicated to the primary team.  We will continue to follow at this time.   Harold Coombes, NP       History of Present Illness  Relevant Aspects of [facility] Course:  Admitted on 07/17/2023 for past medical history  of  coronary artery disease status post PCI, chronic kidney disease stage IV, COPD, PAH, OSA, diabetes mellitus with neuropathy, chronic venous insufficiency, orthostatic hypotension, hyperlipidemia, diabetic foot ulcer, traumatic right ankle fracture on 12/2022 requiring hardware placement complicated by need for multiple surgeries and infection with Enterobacter cloacae bacteremia and recent right ankle arthrodesis on 05/15/2023 currently being followed by infectious disease and saw them today and drainage has resolved. Consult placed for depression.   Patient Report:  68 yo male with chronic medical issues, consult placed for depression.  "Things have been going downhill since November for me" after he broke his ankle and it became infected.  "I gradually lost interest in everything."  He was living in a rehab prior to hospitalization and now "lost" the bed which is upsetting to him.  Dysphoric mood, low motivation and energy, anhedonia, with some worthless feelings at times.  Denies hopelessness, suicidal ideations, past suicide attempts, psychiatric hospitalizations, and self-harm behaviors.  High anxiety with constant worry, feeling overwhelmed at times, some concentration issues with a couple of panic attacks in his life.  No trauma or mania symptoms, past and present.  Sleep was "alright", appetite is low with a weight loss of 106 pounds since the fall, unintentional.  Denies hallucinations, paranoia, homicidal ideations, and  substance abuse.  He was living with his husband prior to admission who is supportive and visits him often.  Currently, he is taking Cymbalta  with his Zoloft  restarting on Friday with no side effects.  He is agreeable to start gabapentin  for anxiety.  TOC consult placed for rehab bed request from the patient.  Psych ROS:  Depression: moderate Anxiety:  high Mania (lifetime and current): denied Psychosis: (lifetime and current): denied  Review of Systems  Psychiatric/Behavioral:  Positive for depression. The patient is nervous/anxious.      Psychiatric and Social History  Psychiatric History:  Information collected from patient and chart.  Prev Dx/Sx: depression, anxiety Current Psych Provider: none Home Meds (current): Zoloft , Cymbalta  Therapy: none  Prior Psych Hospitalization: none  Prior Self Harm: none Prior Violence: none  Family Psych History: none Family Hx suicide: none  Social History:  Occupational Hx: retired Armed forces operational officer Hx: none Living Situation: rehab prior to hospitalization  Access to weapons/lethal means: none   Substance History Denied Substance use  Exam Findings  Physical Exam: Completed by the MD, reviewed: Vital Signs:  Temp:  [97.6 F (36.4 C)-97.8 F (36.6 C)] 97.7 F (36.5 C) (05/31 0750) Pulse Rate:  [59-71] 68 (05/31 0750) Resp:  [18] 18 (05/31 0750) BP: (111-144)/(47-88) 144/75 (05/31 0750) SpO2:  [98 %-100 %] 100 % (05/31 0750) Weight:  [102.8 kg] 102.8 kg (05/31 0348) Blood pressure (!) 144/75, pulse 68, temperature 97.7 F (36.5 C), temperature source Oral, resp. rate 18, height 6' (1.829 m), weight 102.8 kg, SpO2 100%. Body mass index is 30.74 kg/m.  Physical Exam  Mental Status Exam: General Appearance: Casual  Orientation:  Full (Time, Place, and Person)  Memory:  Immediate;   Good Recent;   Good Remote;   Good  Concentration:  Concentration: Good and Attention Span: Good  Recall:  Good  Attention  Good  Eye Contact:  Fair   Speech:  Normal Rate  Language:  Good  Volume:  Normal  Mood: depressed, anxious  Affect:  Congruent  Thought Process:  Coherent  Thought Content:  Logical  Suicidal Thoughts:  No  Homicidal Thoughts:  No  Judgement:  Good  Insight:  Good  Psychomotor Activity:  Decreased  Akathisia:  No  Fund of Knowledge:  Good      Assets:  Housing Intimacy Leisure Time Resilience Social Support  Cognition:  WNL  ADL's:  Impaired  AIMS (if indicated):        Other History   These have been pulled in through the EMR, reviewed, and updated if appropriate.  Family History:  The patient's He was adopted. Family history is unknown by patient.  Medical History: Past Medical History:  Diagnosis Date   Anemia    Anginal pain (HCC) 04/2013   Cardiac cath showed patent stents with distal LAD, circumflex-OM and RCA disease in small vessels.   Anxiety    Atrial fibrillation (HCC) 12/2021   CAD S/P percutaneous coronary angioplasty 2003, 04/2012   status post PCI to LAD, circumflex-OM 2, RCA   Carotid artery occlusion    Chronic renal insufficiency, stage II (mild)    Chronic venous insufficiency    varicosities, no reflux; dopplers 04/14/12- valvular insufficiency in the R and L GSV   Complication of anesthesia    COPD, mild (HCC)    Depression    situaltional    Diabetes mellitus    Diabetic neuropathy (HCC) 08/24/2019   Dyslipidemia associated with type 2 diabetes mellitus (HCC)    Fall at home, initial encounter 01/02/2023   GERD (gastroesophageal reflux disease)    History of cardioversion 12/2021   at [facility]   HTN (hypertension)    Hypothyroidism    Neuromuscular disorder (HCC)    neuropathy in feet   Neuropathy    notably improved following PCI with improved cardiac function   Obesity    Obesity, Class II, BMI 35.0-39.9, with comorbidity (see actual BMI)    BMI 39; wgt loss efforts in place; seeing Dietician   Open ankle fracture 01/02/2023   Orthostatic hypotension     PONV (postoperative nausea and vomiting)    "[employer]"   Pulmonary hypertension (HCC) 04/2012   PA pressure   Sleep apnea    uses nightly   Vitamin D  deficiency    per facility notes    Surgical History: Past Surgical History:  Procedure Laterality Date   ABDOMINAL AORTAGRAM N/A 11/15/2011   Procedure: ABDOMINAL Tommi Fraise;  Surgeon: Richrd Char, MD;  Location: [facility] CATH LAB;  Service: Cardiovascular;  [name];   ABDOMINAL AORTOGRAM W/LOWER EXTREMITY N/A 10/13/2019   Procedure: ABDOMINAL AORTOGRAM W/LOWER EXTREMITY;  Surgeon: Wenona Hamilton, MD;  Location: [facility] INVASIVE CV LAB;  Service: CV: Non-obst Ao-Iliac. R SFA-PopA patent with significant calcific stenosis of TP trunk-prox PTA (dominant) & CTO ATA  -> R PTA-TP PTCA   ABDOMINAL AORTOGRAM W/LOWER EXTREMITY N/A 11/06/2022   Procedure: ABDOMINAL AORTOGRAM W/LOWER EXTREMITY;  Surgeon: Wenona Hamilton, MD;  Location: [facility] INVASIVE CV LAB;  Service: Cardiovascular;  [name];   ABDOMINAL AORTOGRAM W/LOWER  EXTREMITY Right 04/01/2023   Procedure: ABDOMINAL AORTOGRAM W/LOWER EXTREMITY;  Surgeon: Margherita Shell, MD;  Location: [facility] INVASIVE CV LAB;  Service: Cardiovascular;  Laterality: Right;   ABIs  04/27/2012   mild bilateral arterial insufficiency   ANKLE FUSION Right 05/15/2023   Procedure: ARTHRODESIS ANKLE WITH HINDFOOT FUSION NAIL;  Surgeon: Laneta Pintos, MD;  [name];  Service: Orthopedics;  Laterality: Right;   APPLICATION OF WOUND VAC Right 03/31/2023   Procedure: APPLICATION OF WOUND VAC;  Surgeon: Laneta Pintos, MD;  [name];  Service: Orthopedics;  Laterality: Right;   BACK SURGERY  2005 x1   X2-2010   BUNIONECTOMY Right 11/11/2013   Procedure: RIGHT FOOT SILVER BUNIONECTOMY;  Surgeon: Amada Backer, MD;  Location: [facility];  Service: Orthopedics;  Laterality: Right;   CARDIAC CATHETERIZATION  12/11/2001   significant 3V CAD, normal LV function   CARDIOVERSION  N/A 03/13/2022   Procedure: CARDIOVERSION;  Surgeon: Wendie Hamburg, MD;  Location: [facility] ENDOSCOPY;  Service: Cardiovascular;  [name];   CARDIOVERSION N/A 07/25/2022   Procedure: CARDIOVERSION;  Surgeon: Hugh Madura, MD;  [facility] INVASIVE CV LAB;  Service: Cardiovascular;  [name];   Carotid Doppler  04/27/2012   right internal carotid: Elevated velocities but no evidence of plaque. Left internal carotid 40-59%   CAROTID ENDARTERECTOMY  2005   Right; recent carotid Dopplers notes elevated velocities.    colonscopy     CORONARY ANGIOPLASTY  12/21/2001   PTCA of the distal and mid AV groove circ, unsuccessful PTCA of second OM total occlusion, unsuccessful PTCA of the apical LAD total occlusion   CORONARY ANGIOPLASTY WITH STENT PLACEMENT  04/29/2012   PCI to 3 RCA lesions, Promus Premiere 2.88mmx8mm distally, mid was 2.63mx28mm and proximally 2.75x58mm, EF 55-60%   CORONARY STENT PLACEMENT  04/28/2012   PCI to LAD (3x53mm Xience DES postdilated to 3.25) and circ prox and mid (2 overlappinmg 2.47mmx12mmXience DES postdilated to 2.80mm)   DOPPLER ECHOCARDIOGRAPHY  04/28/2012   poor quality study: EF estimated 60-65%; unable to assess diastolic function (previously noted to have diastolic dysfunction); severely dilated left atrium and mild right atrium; dilated IVC consistent with increased central venous pressure.Harold [name]   EXTERNAL FIXATION LEG Right 05/12/2023   Procedure: EXTERNAL FIXATION, LOWER EXTREMITY;  Surgeon: Laneta Pintos, MD;  [name];  Service: Orthopedics;  Laterality: Right;   HARDWARE REMOVAL Right 03/31/2023   Procedure: HARDWARE REMOVAL RIGHT ANKLE;  Surgeon: Laneta Pintos, MD;  [name];  Service: Orthopedics;  Laterality: Right;   HARDWARE REMOVAL Right 01/28/2023   Procedure: REMOVAL OF HARDWARE RIGHT ANKLE;  Surgeon: Hardy Lia, MD;  [name];  Service: Orthopedics;  Laterality: Right;   HARDWARE REMOVAL Right 06/21/2023    Procedure: REMOVAL, HARDWARE;  Surgeon: Laneta Pintos, MD;  [name];  Service: Orthopedics;  Laterality: Right;   HERNIA REPAIR     I & D EXTREMITY Right 01/02/2023   Procedure: IRRIGATION AND DEBRIDEMENT RIGHT ANKLE;  Surgeon: Hardy Lia, MD;  [name];  Service: Orthopedics;  Laterality: Right;   I & D EXTREMITY Right 04/02/2023   Procedure: DEBRIDEMENT RIGHT ANKLE;  Surgeon: Timothy Ford, MD;  Location: [facility] OR;  Service: Orthopedics;  Laterality: Right;   IR FLUORO GUIDE CV LINE LEFT  04/22/2023   IR FLUORO GUIDE CV LINE RIGHT  04/07/2023   IR PATIENT EVAL TECH 0-60 MINS  04/07/2023   IR RADIOLOGIST EVAL &  MGMT  04/08/2023   IR REMOVAL TUN CV CATH W/O FL  05/05/2023   IR US  GUIDE VASC ACCESS LEFT  04/22/2023   IR US  GUIDE VASC ACCESS RIGHT  04/07/2023   LEFT AND RIGHT HEART CATHETERIZATION WITH CORONARY ANGIOGRAM N/A 04/27/2012   Procedure: LEFT AND RIGHT HEART CATHETERIZATION WITH CORONARY ANGIOGRAM;  Surgeon: Arleen Lacer, MD;  Location: [facility] CATH LAB;  Service: Cardiovascular;  [name];   LEFT AND RIGHT HEART CATHETERIZATION WITH CORONARY ANGIOGRAM N/A 05/14/2013   Procedure: LEFT AND RIGHT HEART CATHETERIZATION WITH CORONARY ANGIOGRAM;  Surgeon: Millicent Ally, MD;  [name]; PCWP ;; [name], [name], Patent RCA ostial mid and distal stents - 70% distal runoff Dz   LUMBAR LAMINECTOMY/DECOMPRESSION MICRODISCECTOMY  01/07/2012   Procedure: LUMBAR LAMINECTOMY/DECOMPRESSION MICRODISCECTOMY 1 LEVEL;  Surgeon: Pasty Bongo, MD;  [name];  Service: Neurosurgery;  Laterality: Bilateral;   Lumbar Three-Four Decompression   LUMBAR LAMINECTOMY/DECOMPRESSION MICRODISCECTOMY N/A 07/26/2020   Procedure: Lumbar two-three Laminectomy/Foraminotomy;  Surgeon: Audie Bleacher, MD;  [name];  Service:  Neurosurgery;  [name];   ORIF ANKLE FRACTURE Right 01/02/2023   Procedure: OPEN REDUCTION INTERNAL FIXATION (ORIF)RIGHT ANKLE FRACTURE;  Surgeon: Hardy Lia, MD;  [name];  Service: Orthopedics;  Laterality: Right;   PERCUTANEOUS CORONARY STENT INTERVENTION (PCI-S) N/A 04/28/2012   Procedure: PERCUTANEOUS CORONARY STENT INTERVENTION (PCI-S);  Surgeon: Millicent Ally, MD;  Location: [facility] CATH LAB;  Service: Cardiovascular;  [name];   PERCUTANEOUS CORONARY STENT INTERVENTION (PCI-S) N/A 04/29/2012   Procedure: PERCUTANEOUS CORONARY STENT INTERVENTION (PCI-S);  Surgeon: Arleen Lacer, MD;  Location: [facility] CATH LAB;  Service: Cardiovascular;  [name];   PERIPHERAL VASCULAR ATHERECTOMY  11/06/2022   Procedure: PERIPHERAL VASCULAR ATHERECTOMY;  Surgeon: Wenona Hamilton, MD;  [facility] INVAS[facility] CV LAB;  Service: Cardiovascular;;   PERIPHERAL VASCULAR BALLOON ANGIOPLASTY Right 10/13/2019   Procedure: PERIPHERAL VASCULAR BALLOON ANGIOPLASTY;  Surgeon: Wenona Hamilton, MD;  [facility] INVAS[facility] CV LAB;  Service: Cardiovascular;  Laterality: Right;  PTCA of R TP Trunk into PTA (Drug-coated) => Follow-up ABIs 0.9 on the right and 0.99 on the left.   PERIPHERAL VASCULAR BALLOON ANGIOPLASTY Right 04/01/2023   Procedure: PERIPHERAL VASCULAR BALLOON ANGIOPLASTY;  Surgeon: Margherita Shell, MD;  [facility] INVAS[facility] CV LAB;  Service: Cardiovascular;  Laterality: Right;   PERIPHERAL VASCULAR INTERVENTION Right 04/01/2023   Procedure: PERIPHERAL VASCULAR INTERVENTION;  Surgeon: Margherita Shell, MD;  [facility] INVASIVE CV LAB;  Service: Cardiovascular;  Laterality: Right;   SPINE SURGERY     UMBILICAL HERNIA REPAIR  2009   steel mesh insert     Medications:   Current Facility-Administered Medications:    acetaminophen  (TYLENOL ) tablet 650 mg, 650 mg, Oral, Q6H PRN **OR** acetaminophen  (TYLENOL ) suppository 650 mg, 650 mg, Rectal, Q6H PRN, Lavanda Porter, MD    alteplase  (CATHFLO ACTIVASE ) injection 2 mg, 2 mg, Intracatheter, Once PRN, Bhandari, Dron Prasad, MD   amiodarone  (PACERONE ) tablet 200 mg, 200 mg, Oral, BID, Patel, Ekta V, MD, 200 mg at 07/19/23 0801   anticoagulant sodium citrate  solution 5 mL, 5 mL, Intracatheter, PRN, Bhandari, Dron Prasad, MD   apixaban  (ELIQUIS ) tablet 5 mg, 5 mg, Oral, BID, Patel, Ekta V, MD, 5 mg at 07/19/23 0801   atorvastatin  (LIPITOR) tablet 40 mg, 40  mg, Oral, QHS, Lavanda Porter, MD, 40 mg at 07/18/23 2140   Chlorhexidine  Gluconate Cloth 2 % PADS 6 each, 6 each, Topical, Daily, Swayze, Ava, DO, 6 each at 07/18/23 1150   clopidogrel  (PLAVIX ) tablet 75 mg, 75 mg, Oral, q morning, Lavanda Porter, MD, 75 mg at 07/19/23 0801   DAPTOmycin  (CUBICIN ) 800 mg in sodium chloride  0.9 % IVPB, 8 mg/kg, Intravenous, Q1400, Lavanda Porter, MD, Last Rate: 132 mL/hr at 07/18/23 1353, 800 mg at 07/18/23 1353   DULoxetine  (CYMBALTA ) DR capsule 40 mg, 40 mg, Oral, Daily, Patel, Ekta V, MD, 40 mg at 07/19/23 0801   ertapenem  (INVANZ ) 1 g in sodium chloride  0.9 % 100 mL IVPB, 1 g, Intravenous, Q24H, Lavanda Porter, MD, Last Rate: 200 mL/hr at 07/18/23 1708, 1 g at 07/18/23 1708   ezetimibe  (ZETIA ) tablet 10 mg, 10 mg, Oral, QHS, Lavanda Porter, MD, 10 mg at 07/18/23 2140   heparin  injection 1,000 Units, 1,000 Units, Intracatheter, PRN, Clementine Cutting, MD   insulin  aspart (novoLOG ) injection 0-5 Units, 0-5 Units, Subcutaneous, QHS, Opyd, Timothy S, MD   insulin  aspart (novoLOG ) injection 0-6 Units, 0-6 Units, Subcutaneous, TID WC, Opyd, Timothy S, MD   levothyroxine  (SYNTHROID ) tablet 88 mcg, 88 mcg, Oral, QAC breakfast, Lavanda Porter, MD, 88 mcg at 07/19/23 1610   lidocaine  (PF) (XYLOCAINE ) 1 % injection 5 mL, 5 mL, Intradermal, PRN, Bhandari, Dron Prasad, MD   lidocaine -prilocaine  (EMLA ) cream 1 Application, 1 Application, Topical, PRN, Bhandari, Dron Prasad, MD   midodrine  (PROAMATINE ) tablet 10 mg, 10 mg, Oral, TID WC, Auston Blush,  MD, 10 mg at 07/19/23 0801   pentafluoroprop-tetrafluoroeth (GEBAUERS) aerosol 1 Application, 1 Application, Topical, PRN, Bhandari, Dron Prasad, MD   sertraline  (ZOLOFT ) tablet 25 mg, 25 mg, Oral, Daily, Swayze, Ava, DO, 25 mg at 07/19/23 0802   sodium chloride  flush (NS) 0.9 % injection 10-40 mL, 10-40 mL, Intracatheter, Q12H, Swayze, Ava, DO, 10 mL at 07/19/23 0805   sodium chloride  flush (NS) 0.9 % injection 10-40 mL, 10-40 mL, Intracatheter, PRN, Swayze, Ava, DO   sodium chloride  flush (NS) 0.9 % injection 3 mL, 3 mL, Intravenous, Q12H, Patel, Ekta V, MD, 3 mL at 07/19/23 0805   sodium chloride  flush (NS) 0.9 % injection 3-10 mL, 3-10 mL, Intravenous, Q12H, Brunilda Capra V, MD, 3 mL at 07/18/23 2142   sodium chloride  flush (NS) 0.9 % injection 3-10 mL, 3-10 mL, Intravenous, PRN, Lavanda Porter, MD  Allergies: Allergies  Allergen Reactions   Beta-Lactamase Inhibitors (Beta-Lactam) Hives, Itching and Rash    AX - penicillin     Ancef  given 9/24 with no obvious reaction   Metformin  And Related Diarrhea and Nausea Only   Other Nausea And Vomiting    General Anesthesia - sometimes causes nausea and vomiting * OK to use scopolamine  patch     Sulfa Antibiotics Hives and Other (See Comments)    Bactrim   Wellbutrin [Bupropion] Other (See Comments)    Mood Changes   Tetracyclines & Related Hives and Rash    doxycycline     Harold Coombes, NP

## 2023-07-19 NOTE — TOC Progression Note (Addendum)
 Transition of Care [facility]) - Progression Note    Patient Details  Name: Harold [name] MRN: [id number] Date of Birth: 03/17/1955  Transition of Care [facility] CM/SW Contact  Arron Big, [place] Phone Number: 07/19/2023, 2:48 PM  Clinical Narrative:   Per MD, patient will potentially be medically stable for DC on Mon. CSW spoke with pts spouse about [facility] accepting patient back, and starting auth. Spouse is agreeable. CSW started insurance [employer], id [id number].   TOC will continue to follow.    Expected Discharge Plan: Skilled Nursing Facility Barriers to Discharge: Continued Medical Work up, English as a second language teacher  Expected Discharge Plan and Services In-house Referral: Clinical Social Work     Living arrangements for the past 2 months: Skilled Nursing Facility                                       Social Determinants of Health (SDOH) Interventions SDOH Screenings   Food Insecurity: No Food Insecurity (07/17/2023)  Housing: Low Risk  (07/17/2023)  Transportation Needs: No Transportation Needs (07/17/2023)  Utilities: Not At Risk (07/17/2023)  Depression (PHQ2-9): Low Risk  (07/17/2023)  Social Connections: Moderately Isolated (07/17/2023)  Tobacco Use: Medium Risk (07/17/2023)    Readmission Risk Interventions     No data to display

## 2023-07-19 NOTE — [place] FL2 (Signed)
 [place]  MEDICAID FL2 LEVEL OF CARE FORM     IDENTIFICATION  Patient Name: Harold [name] Birthdate: 18-Aug-1955 Sex: male Admission Date (Current Location): 07/17/2023  [facility] and [place] Number:  Producer, television/film/video and Address:  [facility], [address] [place], [place] [address]      Provider Number: [id number]  Attending Physician Name and Address:  Junita Oliva, DO  Relative Name and Phone Number:       Current Level of Care: Hospital Recommended Level of Care: Skilled Nursing Facility Prior Approval Number:    Date Approved/Denied:   PASRR Number: [id number]  Discharge Plan: SNF    Current Diagnoses: 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, right (HCC) 03/28/2023   Abscess of skin of right ankle 03/28/2023   PAD (peripheral artery disease) (HCC) 03/28/2023   Vitamin D  deficiency 01/07/2023   Prolonged QT interval 01/02/2023   Acute kidney injury superimposed on stage 4 chronic kidney disease (HCC) 01/02/2023   Normocytic anemia 01/02/2023   Paroxysmal atrial fibrillation (HCC) 01/02/2023   Uncontrolled type 2 diabetes mellitus with hyperglycemia, with long-term current use of insulin  (HCC) 01/02/2023   Acquired hypothyroidism 01/02/2023   CKD stage 3b, GFR 30-44 ml/min (HCC) 11/07/2022   CAD in native artery 11/07/2022   PVD  (peripheral vascular disease) (HCC) 11/06/2022   Hypercoagulable state due to persistent atrial fibrillation (HCC) 02/19/2022   Persistent atrial fibrillation (HCC) 01/28/2022   Lumbar stenosis with neurogenic claudication 07/26/2020   Diabetic neuropathy (HCC) 08/24/2019   Trigger thumb of left hand 09/02/2018   Atherosclerosis of native artery of both lower extremities with intermittent claudication (HCC) 05/12/2018   Pain of left hand 03/26/2018   Trigger finger 03/03/2017   Pain in finger of left hand 03/03/2017   Snoring 08/20/2016   Morbid (severe) obesity due to excess calories (HCC) 04/18/2016   Venous stasis of both lower extremities - with edema 02/25/2015   Right-sided chest wall pain 05/08/2014   Gastroesophageal reflux disease without esophagitis 10/10/2013   Chronic heart failure with preserved ejection fraction (HFpEF) (HCC) 06/27/2013   COPD (chronic obstructive pulmonary disease) (HCC) 06/17/2013   Restrictive lung disease 06/17/2013   Obesity, Class III, BMI 40-49.9 (morbid obesity) 09/01/2012   Generalized weakness 09/01/2012   Chronic renal disease, stage IV (HCC) 04/30/2012   OSA on CPAP 04/29/2012   Pulmonary hypertension, 04/29/2012   Dyspnea on exertion   04/26/2012   CAD, LAD/CFX DES 04/28/12- staged RCA DES 04/29/12 after pt declined CABG, cath 05/14/13 stable CAD medical therapy    Carotid artery stenosis without cerebral infarction, bilateral 11/07/2011   Diabetes mellitus type 2 with neurological manifestations (HCC) 04/29/2011   Essential hypertension 04/29/2011   Neuropathy 04/29/2011   Hyperlipidemia associated with type 2 diabetes mellitus (HCC) 04/29/2011   Anxiety and depression 04/29/2011    Orientation RESPIRATION BLADDER Height & Weight     Self, Time, Situation, Place  Normal Continent Weight: 226 lb 10.1 oz (102.8 kg) Height:  6' (182.9 cm)  BEHAVIORAL SYMPTOMS/MOOD NEUROLOGICAL BOWEL NUTRITION STATUS      Continent Diet (see dc  summary)  AMBULATORY STATUS COMMUNICATION OF NEEDS Skin   Extensive Assist Verbally Other (Comment) (Wound / Incision - Arm Left;Lower;Posterior)                       Personal Care Assistance Level of Assistance  Bathing, Feeding, Dressing Bathing Assistance: Maximum assistance Feeding assistance: Independent Dressing Assistance: Maximum assistance     Functional Limitations Info  Sight, Hearing, Speech Sight Info: Impaired (eyeglasses) Hearing Info: Adequate Speech Info: Adequate    SPECIAL CARE FACTORS FREQUENCY  PT (By licensed PT), OT (By licensed OT)     PT Frequency: 5x/week OT Frequency: 5x/week            Contractures Contractures Info: Not present    Additional Factors Info  Code Status, Allergies Code Status Info: full code Allergies Info: Beta-lactamase Inhibitors (beta-lactam, metformin  and related, Wellbutrin (bupropion), sulfa antibiotics, Tetracyclines & Related, General Anesthesia - sometimes causes nausea and vomiting * OK to use scopolamine  patch           Current Medications (07/19/2023):  This is the current hospital active medication list Current Facility-Administered Medications  Medication Dose Route Frequency Provider Last Rate Last Admin   acetaminophen  (TYLENOL ) tablet 650 mg  650 mg Oral Q6H PRN Patel, Ekta V, MD       Or   acetaminophen  (TYLENOL ) suppository 650 mg  650 mg Rectal Q6H PRN Patel, Ekta V, MD       alteplase  (CATHFLO ACTIVASE ) injection 2 mg  2 mg Intracatheter Once PRN Clementine Cutting, MD       amiodarone  (PACERONE ) tablet 200 mg  200 mg Oral BID Patel, Ekta V, MD   200 mg at 07/19/23 0801   anticoagulant sodium citrate  solution 5 mL  5 mL Intracatheter PRN Clementine Cutting, MD       apixaban  (ELIQUIS ) tablet 5 mg  5 mg Oral BID Lavanda Porter, MD   5 mg at 07/19/23 0801   atorvastatin  (LIPITOR) tablet 40 mg  40 mg Oral QHS Patel, Ekta V, MD   40 mg at 07/18/23 2140   Chlorhexidine  Gluconate Cloth 2 % PADS 6  each  6 each Topical Daily Swayze, Ava, DO   6 each at 07/19/23 1000   clopidogrel  (PLAVIX ) tablet 75 mg  75 mg Oral q morning Patel, Ekta V, MD   75 mg at 07/19/23 0801   DAPTOmycin  (CUBICIN ) 800 mg in sodium chloride  0.9 % IVPB  8 mg/kg Intravenous Q1400 Lavanda Porter, MD 132 mL/hr at 07/19/23 1447 800 mg at 07/19/23 1447   [START ON 07/20/2023] DULoxetine  (CYMBALTA ) DR capsule 60 mg  60 mg Oral Daily Lissa Riding, NP       ertapenem  (INVANZ ) 1 g in sodium chloride  0.9 % 100 mL IVPB  1 g Intravenous Q24H Patel, Ekta V, MD 200 mL/hr at 07/18/23 1708 1 g at 07/18/23 1708   ezetimibe  (ZETIA ) tablet 10 mg  10 mg Oral QHS Patel, Ekta V, MD   10 mg at 07/18/23 2140   gabapentin  (NEURONTIN ) capsule 100 mg  100 mg Oral TID Lissa Riding, NP   100 mg at 07/19/23 1005   heparin  injection 1,000 Units  1,000 Units Intracatheter PRN Clementine Cutting, MD       insulin   aspart (novoLOG ) injection 0-5 Units  0-5 Units Subcutaneous QHS Opyd, Timothy S, MD       insulin  aspart (novoLOG ) injection 0-6 Units  0-6 Units Subcutaneous TID WC Opyd, Timothy S, MD       levothyroxine  (SYNTHROID ) tablet 88 mcg  88 mcg Oral QAC breakfast Lavanda Porter, MD   88 mcg at 07/19/23 1610   lidocaine  (PF) (XYLOCAINE ) 1 % injection 5 mL  5 mL Intradermal PRN Clementine Cutting, MD       lidocaine -prilocaine  (EMLA ) cream 1 Application  1 Application Topical PRN Bhandari, Dron Prasad, MD       midodrine  (PROAMATINE ) tablet 10 mg  10 mg Oral TID WC Auston Blush, MD   10 mg at 07/19/23 1255   pentafluoroprop-tetrafluoroeth (GEBAUERS) aerosol 1 Application  1 Application Topical PRN Bhandari, Dron Prasad, MD       sodium chloride  flush (NS) 0.9 % injection 10-40 mL  10-40 mL Intracatheter Q12H Swayze, Ava, DO   10 mL at 07/19/23 0805   sodium chloride  flush (NS) 0.9 % injection 10-40 mL  10-40 mL Intracatheter PRN Swayze, Ava, DO       sodium chloride  flush (NS) 0.9 % injection 3 mL  3 mL Intravenous Q12H Brunilda Capra V, MD   3  mL at 07/19/23 0805   sodium chloride  flush (NS) 0.9 % injection 3-10 mL  3-10 mL Intravenous Q12H Brunilda Capra V, MD   3 mL at 07/18/23 2142   sodium chloride  flush (NS) 0.9 % injection 3-10 mL  3-10 mL Intravenous PRN Lavanda Porter, MD         Discharge Medications: Please see discharge summary for a list of discharge medications.  Relevant Imaging Results:  Relevant Lab Results:   Additional Information SSN; [id number] 6 weeks IV ABx: Daptomycin  800 mg IV every 24 hours and ertapenem  1 g every 24 hours  Anne-Marie Genson C Serine Kea, LCSWA

## 2023-07-19 NOTE — Progress Notes (Signed)
 Pt wasn't able to stay standing for orthostatic blood pressure due to dizziness and weakness. Pt's skin was so fragile and it started to break up and bleeding one location at a time. Location of skin breakdown-  right arm, right heel, right leg New dressings were applied.

## 2023-07-19 NOTE — Plan of Care (Signed)
  Problem: Clinical Measurements: Goal: Will remain free from infection Outcome: Progressing Goal: Respiratory complications will improve Outcome: Progressing Goal: Cardiovascular complication will be avoided Outcome: Progressing   Problem: Elimination: Goal: Will not experience complications related to bowel motility Outcome: Progressing Goal: Will not experience complications related to urinary retention Outcome: Progressing   Problem: Skin Integrity: Goal: Risk for impaired skin integrity will decrease Outcome: Progressing   Problem: Tissue Perfusion: Goal: Adequacy of tissue perfusion will improve Outcome: Progressing

## 2023-07-20 DIAGNOSIS — F331 Major depressive disorder, recurrent, moderate: Secondary | ICD-10-CM | POA: Diagnosis not present

## 2023-07-20 DIAGNOSIS — I959 Hypotension, unspecified: Secondary | ICD-10-CM | POA: Diagnosis not present

## 2023-07-20 LAB — CBC WITH DIFFERENTIAL/PLATELET
Abs Immature Granulocytes: 0.02 10*3/uL (ref 0.00–0.07)
Basophils Absolute: 0 10*3/uL (ref 0.0–0.1)
Basophils Relative: 1 %
Eosinophils Absolute: 0.3 10*3/uL (ref 0.0–0.5)
Eosinophils Relative: 6 %
HCT: 27.6 % — ABNORMAL LOW (ref 39.0–52.0)
Hemoglobin: 9.4 g/dL — ABNORMAL LOW (ref 13.0–17.0)
Immature Granulocytes: 0 %
Lymphocytes Relative: 31 %
Lymphs Abs: 1.7 10*3/uL (ref 0.7–4.0)
MCH: 29.2 pg (ref 26.0–34.0)
MCHC: 34.1 g/dL (ref 30.0–36.0)
MCV: 85.7 fL (ref 80.0–100.0)
Monocytes Absolute: 0.4 10*3/uL (ref 0.1–1.0)
Monocytes Relative: 7 %
Neutro Abs: 3 10*3/uL (ref 1.7–7.7)
Neutrophils Relative %: 55 %
Platelets: 199 10*3/uL (ref 150–400)
RBC: 3.22 MIL/uL — ABNORMAL LOW (ref 4.22–5.81)
RDW: 15.8 % — ABNORMAL HIGH (ref 11.5–15.5)
WBC: 5.6 10*3/uL (ref 4.0–10.5)
nRBC: 0 % (ref 0.0–0.2)

## 2023-07-20 LAB — GLUCOSE, CAPILLARY
Glucose-Capillary: 104 mg/dL — ABNORMAL HIGH (ref 70–99)
Glucose-Capillary: 126 mg/dL — ABNORMAL HIGH (ref 70–99)
Glucose-Capillary: 165 mg/dL — ABNORMAL HIGH (ref 70–99)
Glucose-Capillary: 145 mg/dL — ABNORMAL HIGH (ref 70–99)

## 2023-07-20 LAB — MAGNESIUM: Magnesium: 2 mg/dL (ref 1.7–2.4)

## 2023-07-20 LAB — BASIC METABOLIC PANEL WITH GFR
Anion gap: 10 (ref 5–15)
BUN: 18 mg/dL (ref 8–23)
CO2: 23 mmol/L (ref 22–32)
Calcium: 8.1 mg/dL — ABNORMAL LOW (ref 8.9–10.3)
Chloride: 105 mmol/L (ref 98–111)
Creatinine, Ser: 2.04 mg/dL — ABNORMAL HIGH (ref 0.61–1.24)
GFR, Estimated: 35 mL/min — ABNORMAL LOW (ref >60)
Glucose, Bld: 103 mg/dL — ABNORMAL HIGH (ref 70–99)
Potassium: 3 mmol/L — ABNORMAL LOW (ref 3.5–5.1)
Sodium: 138 mmol/L (ref 135–145)

## 2023-07-20 MED ORDER — MIDODRINE HCL 5 MG PO TABS
15.0000 mg | ORAL_TABLET | Freq: Three times a day (TID) | ORAL | Status: DC
  Administered 2023-07-20 – 2023-07-21 (×2): 15 mg via ORAL
  Filled 2023-07-20 (×3): qty 3

## 2023-07-20 MED ORDER — POTASSIUM CHLORIDE CRYS ER 20 MEQ PO TBCR
40.0000 meq | EXTENDED_RELEASE_TABLET | ORAL | Status: AC
  Administered 2023-07-20 (×2): 40 meq via ORAL
  Filled 2023-07-20 (×2): qty 2

## 2023-07-20 MED ORDER — SERTRALINE HCL 50 MG PO TABS
50.0000 mg | ORAL_TABLET | Freq: Every day | ORAL | Status: AC
  Administered 2023-07-20 – 2023-07-22 (×3): 50 mg via ORAL
  Filled 2023-07-20 (×3): qty 1

## 2023-07-20 MED ORDER — SERTRALINE HCL 100 MG PO TABS: 100.0000 mg | ORAL_TABLET | Freq: Every day | ORAL | Status: DC

## 2023-07-20 MED ORDER — DULOXETINE HCL 20 MG PO CPEP
20.0000 mg | ORAL_CAPSULE | Freq: Every day | ORAL | Status: DC
  Administered 2023-07-21 – 2023-07-22 (×2): 20 mg via ORAL
  Filled 2023-07-20 (×2): qty 1

## 2023-07-20 MED ORDER — LACTATED RINGERS IV BOLUS
1000.0000 mL | Freq: Once | INTRAVENOUS | Status: AC
  Administered 2023-07-20: 1000 mL via INTRAVENOUS

## 2023-07-20 NOTE — Progress Notes (Signed)
 Progress Note   Patient: Harold [name] [id number] DOB: Jun 21, 1955 DOA: 07/17/2023     0 DOS: the patient was seen and examined on 07/20/2023   Brief hospital course: Harold [name] is a 68 y.o. male with past medical history  of  coronary artery disease status post PCI, chronic kidney disease stage IV, COPD, PAH, OSA, diabetes mellitus with neuropathy, chronic venous insufficiency, orthostatic hypotension, hyperlipidemia, diabetic foot ulcer, traumatic right ankle fracture on 12/2022 requiring hardware placement complicated by need for multiple surgeries and infection with Enterobacter cloacae bacteremia and recent right ankle arthrodesis on 05/15/2023 currently being followed by infectious disease and saw them today and drainage has resolved. The patient was sent to the ED from ID office due to hypotension. The patient apparently had passed out in the days previous to admission. She states that she has had intermittent issues with lightheadedness since November of last year. She was given florinef  and midodrine  in the ED with resultant hypertension. Florinef  has not been continued. The patient's most recent echocardiogram was performed in 03/2023 and demonstrated an Ef of 55-60% with normal function and regional wall motion abnormalities with basal inferior hypokinesis. He has Grade II diastolic dysfunction. There is normal RV systolic function with mildly enlarged RV size and mildly elevated pulmonary artery systolic pressure. RVSP was 38.3 mmHg. There is moderately dilated left atrial size and mildly dilated right atrial size.  DDx includes autonomic neuropathy due to his longstanding history of diabetes; adrenal insufficiency, sepsis, PE, Medications.  Cosyntropin  stimulation test was negative. Infectious disease has been consulted.  This morning while nursing was attempting to get orthostatics the patient was unable to stand to complete them due to dizziness. Blood pressure at that  time was highly orthostatic at 110/65. His prior blood pressure was 147/90. . The patient was given a one liter bolus. I discussed with him options for treating what is most likely autonomic dysregulation. He states that compression hose give him ulcers on his feet. He states that an abdominal binder doesn't help. I will increase his midodrine  to 15 mg tid.  One episode of hypotension on 07/18/2023 1300-1500 . None recorded since.  Continue midodrine .  Assessment and Plan:  Hypotension/Syncope Differentials include: Autonomic neuropathy-secondary to his longstanding history of diabetes, will continue with midodrine . Florinef  has been discontinued. Will follow orthostatics. Monitor blood sugars. Midodrine  continued.  Adrenal Insufficiency - ruled out with normal cosyntropin  stim test  Sepsis related to the patient's chronic infection of ankle. Blood cultures are have had no growth. D Dimer was positive, but PE is unllikely given the patient's ongoing anticoagulation with eliquis . He is continued on his chronic ertapenem  and daptomycin . WBC is within normal range. Infectious disease input is appreciated. - Unlikely ID consulted. Dr. Gillian Lacrosse has stated that his foot has no acute signs of infection. He states only that he wants for the patien to follow up with them in their office before then end date of his antibiotics on 08/02/2023.   Medication related-in this patient I would definitely avoid medications that can cause contribute or worsen his blood pressure.  Patient is currently on Cymbalta  60 mg which I will change to 40 mg dosing currently, Flomax  will be stopped as it is known for causing orthostatic hypotension.  Intermittent use of diuretics may also play a role. Will hold for now.   Currently we will hold his blood pressure medications and continue with midodrine . Florinef  has been discontinued.  Will order a current echocardiogram to rule out  a change in EF and cardiac output as a cause  for hypotension and syncope. This is pending.  Orthostatic hypotension is most likely due to autonomic dysregulation related to his DM II.    Depression:  The patient is complaining of depression. Psychiatry consulted. They have increased his Cymbalta  to 60 mg daily. Zoloft  25 mg has been discontinued. Gabapentin  100 mg tid has been started for anxiety. He does not meet criteria for inpatient treatment, and is not considered a danger to himself or others.   Hypothyroidism: Continue levothyroxine . TSH was 6.242, but T4 was in normal range at 0.83. This is likely due to non-thyroidal illness.  Diabetes mellitus type 2: Consistent carb diet, glycemic protocol and sliding scale insulin  regimen management.   OSA on CPAP/ pulmonary HTN: Cont cpap at night per home settings.    PAD: Patient is to have a repeat ABI and lower extremity arterial duplex per cardiology note.   Persistent A-fib: Currently in sinus rhythm on exam patient is on amiodarone  which we will continue along with Eliquis .   BPH: Flomax  held   Rt Ankle hardware infection: Pharmacy consult  to continue  ertapenem  and daptomycin . Infectious disease input is appreciated.   I have seen and examined this patient myself. I have spent 32 minutes in his evaluation and care.      Subjective: The patient is resting comfortably. No new complaints.   Physical Exam: Vitals:   07/20/23 0443 07/20/23 0713 07/20/23 1111 07/20/23 1558  BP: 136/65 (!) 147/90 110/65 (!) 117/104  Pulse: 67 66 67 61  Resp:  18 18 20   Temp: (!) 97.4 F (36.3 C) (!) 97.4 F (36.3 C)  98.7 F (37.1 C)  TempSrc: Oral Oral  Oral  SpO2:  98% 100% 100%  Weight:      Height:       Exam:  Constitutional:  The patient is awake, alert, and oriented x 3. No acute distress. Respiratory:  No increased work of breathing. No wheezes, rales, or rhonchi No tactile fremitus Cardiovascular:  Regular rate and rhythm No murmurs, ectopy, or gallups. No  lateral PMI. No thrills. Abdomen:  Abdomen is soft, non-tender, non-distended No hernias, masses, or organomegaly Normoactive bowel sounds.  Musculoskeletal:  No cyanosis, clubbing, or edema Skin:  No rashes, lesions, ulcers palpation of skin: no induration or nodules Neurologic:  CN 2-12 intact Sensation all 4 extremities intact Psychiatric:  Mental status Mood, affect appropriate Orientation to person, place, time  judgment and insight appear intact  Data Reviewed:  CBC BMP  Family Communication: None available  Disposition: Status is: Observation The patient remains OBS appropriate and will d/c before 2 midnights.  Planned Discharge Destination: Home    Time spent: 38 minutes  Author: Kieth Hartis, DO 07/20/2023 5:30 PM  For on call review [email or web address].

## 2023-07-20 NOTE — Consult Note (Addendum)
 [facility] Psychiatric Consult Follow Up  Patient Name: .Harold [name]  MRN: [id number]  DOB: 05-Aug-1955  Consult Order details:  Orders (From admission, onward)     Start     Ordered   07/18/23 1803  IP CONSULT TO PSYCHIATRY       Ordering Provider: Junita Oliva, DO  Provider:  (Not yet assigned)  Question Answer Comment  Location [facility]   Reason for Consult? Depression.      07/18/23 1802             Mode of Visit: In person    Psychiatry Consult Evaluation  Service Date: July 20, 2023 LOS:  LOS: 0 days  Chief Complaint "Things have been going downhill since November for me" after he broke his ankle and it became infected.  "I gradually lost interest in everything."  Primary Psychiatric Diagnoses  Major depressive disorder, recurrent, moderate  Assessment  Harold [name] is a 68 y.o. male admitted: Medicallyfor 07/17/2023 11:45 AM for  past medical history  of  coronary artery disease status post PCI, chronic kidney disease stage IV, COPD, PAH, OSA, diabetes mellitus with neuropathy, chronic venous insufficiency, orthostatic hypotension, hyperlipidemia, diabetic foot ulcer, traumatic right ankle fracture on 12/2022 requiring hardware placement complicated by need for multiple surgeries and infection with Enterobacter cloacae bacteremia and recent right ankle arthrodesis on 05/15/2023 currently being followed by infectious disease and saw them today and drainage has resolved. Harold [name] He carries the psychiatric diagnoses of depression.  His current presentation of dysphoric mood, anhedonia, low motivation and energy are most consistent with MDD.  Current outpatient psychotropic medications include Zoloft  and Cymbalta  and historically he has had a moderate response to these medications. He was compliant with medications prior to admission as evidenced by self-report. On initial examination, patient was lying in bed in the dark, affect was dysphoric,  cooperative, and pleasant. Please see plan below for detailed recommendations.   Diagnoses:  Active Hospital problems: Principal Problem:   Hypotension Active Problems:   Major depressive disorder, recurrent episode, moderate (HCC)    Plan   ## Psychiatric Medication Recommendations:  Cymbalta  40 mg daily increased to 20 mg for 3 days Zoloft  50 mg daily added with increase to 100 mg in 3 days Gabapentin  100 mg TID for anxiety continued  ## Medical Decision Making Capacity: Not specifically addressed in this encounter  ## Further Work-up:  -- most recent EKG on 07/17/2023 had QtC of 540 -- Pertinent labwork reviewed earlier this admission includes: CBC and diff, chem panel, EKG, urine, toxicology  ## Disposition:-- There are no psychiatric contraindications to discharge at this time  ## Behavioral / Environmental: - No specific recommendations at this time.     ## Safety and Observation Level:  - Based on my clinical evaluation, I estimate the patient to be at low risk of self harm in the current setting. - At this time, we recommend  routine. This decision is based on my review of the chart including patient's history and current presentation, interview of the patient, mental status examination, and consideration of suicide risk including evaluating suicidal ideation, plan, intent, suicidal or self-harm behaviors, risk factors, and protective factors. This judgment is based on our ability to directly address suicide risk, implement suicide prevention strategies, and develop a safety plan while the patient is in the clinical setting. Please contact our team if there is a concern that risk level has changed.  CSSR Risk Category:C-SSRS RISK CATEGORY: No Risk  Suicide Risk Assessment: Patient has following modifiable risk factors for suicide: under treated depression , which we are addressing by adjusting medications. Patient has following non-modifiable or demographic risk factors for  suicide: male gender Patient has the following protective factors against suicide: Access to outpatient mental health care, Supportive family, Frustration tolerance, no history of suicide attempts, and no history of NSSIB  Thank you for this consult request. Recommendations have been communicated to the primary team.  We will continue to follow at this time.   Roslynn Coombes, NP       History of Present Illness  Relevant Aspects of [facility] Course:  Admitted on 07/17/2023 for past medical history  of  coronary artery disease status post PCI, chronic kidney disease stage IV, COPD, PAH, OSA, diabetes mellitus with neuropathy, chronic venous insufficiency, orthostatic hypotension, hyperlipidemia, diabetic foot ulcer, traumatic right ankle fracture on 12/2022 requiring hardware placement complicated by need for multiple surgeries and infection with Enterobacter cloacae bacteremia and recent right ankle arthrodesis on 05/15/2023 currently being followed by infectious disease and saw them today and drainage has resolved. Consult placed for depression.   Patient Report:  68 yo male with chronic medical issues, consult placed for depression.  "Things have been going downhill since November for me" after he broke his ankle and it became infected.  "I gradually lost interest in everything."  He was living in a rehab prior to hospitalization and now "lost" the bed which is upsetting to him.  Dysphoric mood, low motivation and energy, anhedonia, with some worthless feelings at times.  Denies hopelessness, suicidal ideations, past suicide attempts, psychiatric hospitalizations, and self-harm behaviors.  High anxiety with constant worry, feeling overwhelmed at times, some concentration issues with a couple of panic attacks in his life.  No trauma or mania symptoms, past and present.  Sleep was "alright", appetite is low with a weight loss of 106 pounds since the fall, unintentional.  Denies hallucinations,  paranoia, homicidal ideations, and substance abuse.  He was living with his husband prior to admission who is supportive and visits him often.  Currently, he is taking Cymbalta  with his Zoloft  restarting on Friday with no side effects.  He is agreeable to start gabapentin  for anxiety.  TOC consult placed for rehab bed request from the patient.  07/20/2023: The client was sleeping prior to the assessment.  He awakened easily.  Depression is "ok" at a moderate level, no suicidal ideations.  Anxiety is "moderate" today with no issue with the gabapentin  that was started yesterday.  Sleep was "alright" and appetite improved today with consumption of a banana and applesauce for breakfast.  He voiced he has been on Cymbalta  in the past and it is not effective, transitioning to Zoloft --taper placed.  This provider let him know the paperwork was completed for him to return to rehab to which he was happy to know.  Psych ROS:  Depression: moderate Anxiety:  high Mania (lifetime and current): denied Psychosis: (lifetime and current): denied  Review of Systems  Psychiatric/Behavioral:  Positive for depression. The patient is nervous/anxious.      Psychiatric and Social History  Psychiatric History:  Information collected from patient and chart.  Prev Dx/Sx: depression, anxiety Current Psych Provider: none Home Meds (current): Zoloft , Cymbalta  Therapy: none  Prior Psych Hospitalization: none  Prior Self Harm: none Prior Violence: none  Family Psych History: none Family Hx suicide: none  Social History:  Occupational Hx: retired Armed forces operational officer Hx: none Living Situation: rehab prior to hospitalization  Access to weapons/lethal means: none   Substance History Denied Substance use  Exam Findings  Physical Exam: Completed by the MD, reviewed: Vital Signs:  Temp:  [97.4 F (36.3 C)-97.8 F (36.6 C)] 97.4 F (36.3 C) (06/01 0713) Pulse Rate:  [66-71] 66 (06/01 0713) Resp:  [17-20] 18 (06/01  0713) BP: (128-158)/(52-90) 147/90 (06/01 0713) SpO2:  [98 %-100 %] 98 % (06/01 0713) Blood pressure (!) 147/90, pulse 66, temperature (!) 97.4 F (36.3 C), temperature source Oral, resp. rate 18, height 6' (1.829 m), weight 102.8 kg, SpO2 98%. Body mass index is 30.74 kg/m.  Physical Exam  Mental Status Exam: General Appearance: Casual  Orientation:  Full (Time, Place, and Person)  Memory:  Immediate;   Good Recent;   Good Remote;   Good  Concentration:  Concentration: Good and Attention Span: Good  Recall:  Good  Attention  Good  Eye Contact:  Fair  Speech:  Normal Rate  Language:  Good  Volume:  Normal  Mood: depressed, anxious  Affect:  Congruent  Thought Process:  Coherent  Thought Content:  Logical  Suicidal Thoughts:  No  Homicidal Thoughts:  No  Judgement:  Good  Insight:  Good  Psychomotor Activity:  Decreased  Akathisia:  No  Fund of Knowledge:  Good      Assets:  Housing Intimacy Leisure Time Resilience Social Support  Cognition:  WNL  ADL's:  Impaired  AIMS (if indicated):        Other History   These have been pulled in through the EMR, reviewed, and updated if appropriate.  Family History:  The patient's He was adopted. Family history is unknown by patient.  Medical History: Past Medical History:  Diagnosis Date   Anemia    Anginal pain (HCC) 04/2013   Cardiac cath showed patent stents with distal LAD, circumflex-OM and RCA disease in small vessels.   Anxiety    Atrial fibrillation (HCC) 12/2021   CAD S/P percutaneous coronary angioplasty 2003, 04/2012   status post PCI to LAD, circumflex-OM 2, RCA   Carotid artery occlusion    Chronic renal insufficiency, stage II (mild)    Chronic venous insufficiency    varicosities, no reflux; dopplers 04/14/12- valvular insufficiency in the R and L GSV   Complication of anesthesia    COPD, mild (HCC)    Depression    situaltional    Diabetes mellitus    Diabetic neuropathy (HCC) 08/24/2019    Dyslipidemia associated with type 2 diabetes mellitus (HCC)    Fall at home, initial encounter 01/02/2023   GERD (gastroesophageal reflux disease)    History of cardioversion 12/2021   at [facility]   HTN (hypertension)    Hypothyroidism    Neuromuscular disorder (HCC)    neuropathy in feet   Neuropathy    notably improved following PCI with improved cardiac function   Obesity    Obesity, Class II, BMI 35.0-39.9, with comorbidity (see actual BMI)    BMI 39; wgt loss efforts in place; seeing Dietician   Open ankle fracture 01/02/2023   Orthostatic hypotension    PONV (postoperative nausea and vomiting)    "[employer] Works"   Pulmonary hypertension (HCC) 04/2012   PA pressure   Sleep apnea    uses nightly   Vitamin D  deficiency    per facility notes    Surgical History: Past Surgical History:  Procedure Laterality Date   ABDOMINAL AORTAGRAM N/A 11/15/2011   Procedure: ABDOMINAL Tommi Fraise;  Surgeon: Richrd Char,  MD;  Location: [facility] CATH LAB;  Service: Cardiovascular;  [name];   ABDOMINAL AORTOGRAM W/LOWER EXTREMITY N/A 10/13/2019   Procedure: ABDOMINAL AORTOGRAM W/LOWER EXTREMITY;  Surgeon: Wenona Hamilton, MD;  Location: [facility] INVASIVE CV LAB;  Service: CV: Non-obst Ao-Iliac. R SFA-PopA patent with significant calcific stenosis of TP trunk-prox PTA (dominant) & CTO ATA  -> R PTA-TP PTCA   ABDOMINAL AORTOGRAM W/LOWER EXTREMITY N/A 11/06/2022   Procedure: ABDOMINAL AORTOGRAM W/LOWER EXTREMITY;  Surgeon: Wenona Hamilton, MD;  Location: [facility]VASIVE CV LAB;  Service: Cardiovascular;  [name];   ABDOMINAL AORTOGRAM W/LOWER EXTREMITY Right 04/01/2023   Procedure: ABDOMINAL AORTOGRAM W/LOWER EXTREMITY;  Surgeon: Margherita Shell, MD;  [facility]: [facility]VAS[facility] CV LAB;  Service: Cardiovascular;  Laterality: Right;   ABIs  04/27/2012   mild bilateral arterial insufficiency   ANKLE FUSION Right 05/15/2023   Procedure: ARTHRODESIS ANKLE WITH HINDFOOT FUSION NAIL;  Surgeon:  Laneta Pintos, MD;  [name];  Service: Orthopedics;  Laterality: Right;   APPLICATION OF WOUND VAC Right 03/31/2023   Procedure: APPLICATION OF WOUND VAC;  Surgeon: Laneta Pintos, MD;  [name];  Service: Orthopedics;  Laterality: Right;   BACK SURGERY  2005 x1   X2-2010   BUNIONECTOMY Right 11/11/2013   Procedure: RIGHT FOOT SILVER BUNIONECTOMY;  Surgeon: Amada Backer, MD;  Location: [facility];  Service: Orthopedics;  Laterality: Right;   CARDIAC CATHETERIZATION  12/11/2001   significant 3V CAD, normal LV function   CARDIOVERSION N/A 03/13/2022   Procedure: CARDIOVERSION;  Surgeon: Wendie Hamburg, MD;  Location: [facility] ENDOSCOPY;  Service: Cardiovascular;  [name];   CARDIOVERSION N/A 07/25/2022   Procedure: CARDIOVERSION;  Surgeon: Hugh Madura, MD;  [facility]: [facility] INVAS[facility] CV LAB;  Service: Cardiovascular;  [name];   Carotid Doppler  04/27/2012   right internal carotid: Elevated velocities but no evidence of plaque. Left internal carotid 40-59%   CAROTID ENDARTERECTOMY  2005   Right; recent carotid Dopplers notes elevated velocities.    colonscopy     CORONARY ANGIOPLASTY  12/21/2001   PTCA of the distal and mid AV groove circ, unsuccessful PTCA of second OM total occlusion, unsuccessful PTCA of the apical LAD total occlusion   CORONARY ANGIOPLASTY WITH STENT PLACEMENT  04/29/2012   PCI to 3 RCA lesions, Promus Premiere 2.19mmx8mm distally, mid was 2.23mx28mm and proximally 2.75x57mm, EF 55-60%   CORONARY STENT PLACEMENT  04/28/2012   PCI to LAD (3x51mm Xience DES postdilated to 3.25) and circ prox and mid (2 overlappinmg 2.41mmx12mmXience DES postdilated to 2.70mm)   DOPPLER ECHOCARDIOGRAPHY  04/28/2012   poor quality study: EF estimated 60-65%; unable to assess diastolic function (previously noted to have diastolic dysfunction); severely dilated left atrium and mild right atrium; dilated IVC consistent with increased central venous  pressure.Harold [name]   EXTERNAL FIXATION LEG Right 05/12/2023   Procedure: EXTERNAL FIXATION, LOWER EXTREMITY;  Surgeon: Laneta Pintos, MD;  [name];  Service: Orthopedics;  Laterality: Right;   HARDWARE REMOVAL Right 03/31/2023   Procedure: HARDWARE REMOVAL RIGHT ANKLE;  Surgeon: Laneta Pintos, MD;  [name];  Service: Orthopedics;  Laterality: Right;   HARDWARE REMOVAL Right 01/28/2023   Procedure: REMOVAL OF HARDWARE RIGHT ANKLE;  Surgeon: Hardy Lia, MD;  [name];  Service: Orthopedics;  Laterality: Right;   HARDWARE REMOVAL Right 06/21/2023   Procedure: REMOVAL, HARDWARE;  Surgeon: Laneta Pintos, MD;  [name];  Service: Orthopedics;  Laterality: Right;  HERNIA REPAIR     I & D EXTREMITY Right 01/02/2023   Procedure: IRRIGATION AND DEBRIDEMENT RIGHT ANKLE;  Surgeon: Hardy Lia, MD;  [name];  Service: Orthopedics;  Laterality: Right;   I & D EXTREMITY Right 04/02/2023   Procedure: DEBRIDEMENT RIGHT ANKLE;  Surgeon: Timothy Ford, MD;  Location: [facility] OR;  Service: Orthopedics;  Laterality: Right;   IR FLUORO GUIDE CV LINE LEFT  04/22/2023   IR FLUORO GUIDE CV LINE RIGHT  04/07/2023   IR PATIENT EVAL TECH 0-60 MINS  04/07/2023   IR RADIOLOGIST EVAL & MGMT  04/08/2023   IR REMOVAL TUN CV CATH W/O FL  05/05/2023   IR US  GUIDE VASC ACCESS LEFT  04/22/2023   IR US  GUIDE VASC ACCESS RIGHT  04/07/2023   LEFT AND RIGHT HEART CATHETERIZATION WITH CORONARY ANGIOGRAM N/A 04/27/2012   Procedure: LEFT AND RIGHT HEART CATHETERIZATION WITH CORONARY ANGIOGRAM;  Surgeon: Arleen Lacer, MD;  Location: [facility] CATH LAB;  Service: Cardiovascular;  [name];   LEFT AND RIGHT HEART CATHETERIZATION WITH CORONARY ANGIOGRAM N/A 05/14/2013   Procedure: LEFT AND RIGHT HEART CATHETERIZATION WITH CORONARY ANGIOGRAM;  Surgeon: Millicent Ally, MD;  [name]; PCWP ;; multivessel CAD with widely patent mid LAD stents and 90%  apical LAD, [name], Patent RCA ostial mid and distal stents - 70% distal runoff Dz   LUMBAR LAMINECTOMY/DECOMPRESSION MICRODISCECTOMY  01/07/2012   Procedure: LUMBAR LAMINECTOMY/DECOMPRESSION MICRODISCECTOMY 1 LEVEL;  Surgeon: Pasty Bongo, MD;  [name];  Service: Neurosurgery;  Laterality: Bilateral;   Lumbar Three-Four Decompression   LUMBAR LAMINECTOMY/DECOMPRESSION MICRODISCECTOMY N/A 07/26/2020   Procedure: Lumbar two-three Laminectomy/Foraminotomy;  Surgeon: Audie Bleacher, MD;  [name];  Service: Neurosurgery;  [name];   ORIF ANKLE FRACTURE Right 01/02/2023   Procedure: OPEN REDUCTION INTERNAL FIXATION (ORIF)RIGHT ANKLE FRACTURE;  Surgeon: Hardy Lia, MD;  [name];  Service: Orthopedics;  Laterality: Right;   PERCUTANEOUS CORONARY STENT INTERVENTION (PCI-S) N/A 04/28/2012   Procedure: PERCUTANEOUS CORONARY STENT INTERVENTION (PCI-S);  Surgeon: Millicent Ally, MD;  Location: [facility] CATH LAB;  Service: Cardiovascular;  [name];   PERCUTANEOUS CORONARY STENT INTERVENTION (PCI-S) N/A 04/29/2012   Procedure: PERCUTANEOUS CORONARY STENT INTERVENTION (PCI-S);  Surgeon: Arleen Lacer, MD;  Location: [facility] CATH LAB;  Service: Cardiovascular;  [name];   PERIPHERAL VASCULAR ATHERECTOMY  11/06/2022   Procedure: PERIPHERAL VASCULAR ATHERECTOMY;  Surgeon: Wenona Hamilton, MD;  [facility]AS[facility] CV LAB;  Service: Cardiovascular;;   PERIPHERAL VASCULAR BALLOON ANGIOPLASTY Right 10/13/2019   Procedure: PERIPHERAL VASCULAR BALLOON ANGIOPLASTY;  Surgeon: Wenona Hamilton, MD;  [facility] CV LAB;  Service: Cardiovascular;  Laterality: Right;  PTCA of R TP Trunk into PTA (Drug-coated) => Follow-up ABIs 0.9 on the right and 0.99 on the left.   PERIPHERAL VASCULAR BALLOON ANGIOPLASTY Right 04/01/2023   Procedure: PERIPHERAL VASCULAR BALLOON ANGIOPLASTY;  Surgeon: Margherita Shell, MD;  [facility] INVASIVE CV  LAB;  Service: Cardiovascular;  Laterality: Right;   PERIPHERAL VASCULAR INTERVENTION Right 04/01/2023   Procedure: PERIPHERAL VASCULAR INTERVENTION;  Surgeon: Margherita Shell, MD;  Location: [facility] INVAS[facility] CV LAB;  Service: Cardiovascular;  Laterality: Right;   SPINE SURGERY     UMBILICAL HERNIA REPAIR  2009   steel mesh insert     Medications:   Current Facility-Administered Medications:    acetaminophen  (TYLENOL ) tablet 650 mg,  650 mg, Oral, Q6H PRN **OR** acetaminophen  (TYLENOL ) suppository 650 mg, 650 mg, Rectal, Q6H PRN, Lavanda Porter, MD   alteplase  (CATHFLO ACTIVASE ) injection 2 mg, 2 mg, Intracatheter, Once PRN, Bhandari, Dron Prasad, MD   amiodarone  (PACERONE ) tablet 200 mg, 200 mg, Oral, BID, Patel, Ekta V, MD, 200 mg at 07/19/23 2157   anticoagulant sodium citrate  solution 5 mL, 5 mL, Intracatheter, PRN, Bhandari, Dron Prasad, MD   apixaban  (ELIQUIS ) tablet 5 mg, 5 mg, Oral, BID, Patel, Ekta V, MD, 5 mg at 07/19/23 2157   atorvastatin  (LIPITOR) tablet 40 mg, 40 mg, Oral, QHS, Patel, Ekta V, MD, 40 mg at 07/19/23 2157   Chlorhexidine  Gluconate Cloth 2 % PADS 6 each, 6 each, Topical, Daily, Swayze, Ava, DO, 6 each at 07/19/23 1000   clopidogrel  (PLAVIX ) tablet 75 mg, 75 mg, Oral, q morning, Lavanda Porter, MD, 75 mg at 07/19/23 0801   DAPTOmycin  (CUBICIN ) 800 mg in sodium chloride  0.9 % IVPB, 8 mg/kg, Intravenous, Q1400, Lavanda Porter, MD, Last Rate: 132 mL/hr at 07/19/23 1447, 800 mg at 07/19/23 1447   DULoxetine  (CYMBALTA ) DR capsule 60 mg, 60 mg, Oral, Daily, Fredric Slabach Y, NP   ertapenem  (INVANZ ) 1 g in sodium chloride  0.9 % 100 mL IVPB, 1 g, Intravenous, Q24H, Lavanda Porter, MD, Last Rate: 200 mL/hr at 07/19/23 1723, 1 g at 07/19/23 1723   ezetimibe  (ZETIA ) tablet 10 mg, 10 mg, Oral, QHS, Lavanda Porter, MD, 10 mg at 07/19/23 2157   gabapentin  (NEURONTIN ) capsule 100 mg, 100 mg, Oral, TID, Lissa Riding, NP, 100 mg at 07/19/23 2157   heparin  injection 1,000 Units, 1,000 Units,  Intracatheter, PRN, Clementine Cutting, MD   insulin  aspart (novoLOG ) injection 0-5 Units, 0-5 Units, Subcutaneous, QHS, Opyd, Timothy S, MD   insulin  aspart (novoLOG ) injection 0-6 Units, 0-6 Units, Subcutaneous, TID WC, Opyd, Timothy S, MD, 1 Units at 07/19/23 1747   levothyroxine  (SYNTHROID ) tablet 88 mcg, 88 mcg, Oral, QAC breakfast, Lavanda Porter, MD, 88 mcg at 07/20/23 5409   lidocaine  (PF) (XYLOCAINE ) 1 % injection 5 mL, 5 mL, Intradermal, PRN, Bhandari, Dron Prasad, MD   lidocaine -prilocaine  (EMLA ) cream 1 Application, 1 Application, Topical, PRN, Bhandari, Dron Prasad, MD   midodrine  (PROAMATINE ) tablet 10 mg, 10 mg, Oral, TID WC, Auston Blush, MD, 10 mg at 07/19/23 1721   pentafluoroprop-tetrafluoroeth (GEBAUERS) aerosol 1 Application, 1 Application, Topical, PRN, Bhandari, Dron Prasad, MD   potassium chloride  SA (KLOR-CON  M) CR tablet 40 mEq, 40 mEq, Oral, Q4H, Swayze, Ava, DO   sodium chloride  flush (NS) 0.9 % injection 10-40 mL, 10-40 mL, Intracatheter, Q12H, Swayze, Ava, DO, 10 mL at 07/19/23 2159   sodium chloride  flush (NS) 0.9 % injection 10-40 mL, 10-40 mL, Intracatheter, PRN, Swayze, Ava, DO   sodium chloride  flush (NS) 0.9 % injection 3 mL, 3 mL, Intravenous, Q12H, Brunilda Capra V, MD, 3 mL at 07/19/23 2159   sodium chloride  flush (NS) 0.9 % injection 3-10 mL, 3-10 mL, Intravenous, Q12H, Brunilda Capra V, MD, 3 mL at 07/19/23 2159   sodium chloride  flush (NS) 0.9 % injection 3-10 mL, 3-10 mL, Intravenous, PRN, Lavanda Porter, MD  Allergies: Allergies  Allergen Reactions   Beta-Lactamase Inhibitors (Beta-Lactam) Hives, Itching and Rash    AX - penicillin     Ancef  given 9/24 with no obvious reaction   Metformin  And Related Diarrhea and Nausea Only   Other Nausea And Vomiting    General Anesthesia - sometimes  causes nausea and vomiting * OK to use scopolamine  patch     Sulfa Antibiotics Hives and Other (See Comments)    Bactrim   Wellbutrin [Bupropion] Other (See Comments)     Mood Changes   Tetracyclines & Related Hives and Rash    doxycycline     Roslynn Coombes, NP

## 2023-07-20 NOTE — Plan of Care (Signed)
  Problem: Clinical Measurements: Goal: Respiratory complications will improve Outcome: Progressing Goal: Cardiovascular complication will be avoided Outcome: Progressing   Problem: Activity: Goal: Risk for activity intolerance will decrease Outcome: Progressing   Problem: Elimination: Goal: Will not experience complications related to bowel motility Outcome: Progressing   Problem: Safety: Goal: Ability to remain free from injury will improve Outcome: Progressing   Problem: Tissue Perfusion: Goal: Adequacy of tissue perfusion will improve Outcome: Progressing

## 2023-07-21 ENCOUNTER — Observation Stay (HOSPITAL_COMMUNITY)

## 2023-07-21 DIAGNOSIS — Z743 Need for continuous supervision: Secondary | ICD-10-CM | POA: Diagnosis not present

## 2023-07-21 DIAGNOSIS — I480 Paroxysmal atrial fibrillation: Secondary | ICD-10-CM | POA: Diagnosis not present

## 2023-07-21 DIAGNOSIS — L89322 Pressure ulcer of left buttock, stage 2: Secondary | ICD-10-CM | POA: Diagnosis present

## 2023-07-21 DIAGNOSIS — E1165 Type 2 diabetes mellitus with hyperglycemia: Secondary | ICD-10-CM | POA: Diagnosis not present

## 2023-07-21 DIAGNOSIS — Z7901 Long term (current) use of anticoagulants: Secondary | ICD-10-CM | POA: Diagnosis not present

## 2023-07-21 DIAGNOSIS — E43 Unspecified severe protein-calorie malnutrition: Secondary | ICD-10-CM | POA: Diagnosis not present

## 2023-07-21 DIAGNOSIS — N1831 Chronic kidney disease, stage 3a: Secondary | ICD-10-CM | POA: Diagnosis not present

## 2023-07-21 DIAGNOSIS — Y831 Surgical operation with implant of artificial internal device as the cause of abnormal reaction of the patient, or of later complication, without mention of misadventure at the time of the procedure: Secondary | ICD-10-CM | POA: Diagnosis present

## 2023-07-21 DIAGNOSIS — G4733 Obstructive sleep apnea (adult) (pediatric): Secondary | ICD-10-CM | POA: Diagnosis present

## 2023-07-21 DIAGNOSIS — I270 Primary pulmonary hypertension: Secondary | ICD-10-CM | POA: Diagnosis not present

## 2023-07-21 DIAGNOSIS — M86171 Other acute osteomyelitis, right ankle and foot: Secondary | ICD-10-CM | POA: Diagnosis not present

## 2023-07-21 DIAGNOSIS — F419 Anxiety disorder, unspecified: Secondary | ICD-10-CM | POA: Diagnosis present

## 2023-07-21 DIAGNOSIS — L89150 Pressure ulcer of sacral region, unstageable: Secondary | ICD-10-CM | POA: Diagnosis present

## 2023-07-21 DIAGNOSIS — Z4889 Encounter for other specified surgical aftercare: Secondary | ICD-10-CM | POA: Diagnosis not present

## 2023-07-21 DIAGNOSIS — I951 Orthostatic hypotension: Secondary | ICD-10-CM | POA: Diagnosis not present

## 2023-07-21 DIAGNOSIS — Z794 Long term (current) use of insulin: Secondary | ICD-10-CM | POA: Diagnosis not present

## 2023-07-21 DIAGNOSIS — R262 Difficulty in walking, not elsewhere classified: Secondary | ICD-10-CM | POA: Diagnosis not present

## 2023-07-21 DIAGNOSIS — I739 Peripheral vascular disease, unspecified: Secondary | ICD-10-CM | POA: Diagnosis not present

## 2023-07-21 DIAGNOSIS — I4819 Other persistent atrial fibrillation: Secondary | ICD-10-CM | POA: Diagnosis present

## 2023-07-21 DIAGNOSIS — I2510 Atherosclerotic heart disease of native coronary artery without angina pectoris: Secondary | ICD-10-CM | POA: Diagnosis not present

## 2023-07-21 DIAGNOSIS — L97314 Non-pressure chronic ulcer of right ankle with necrosis of bone: Secondary | ICD-10-CM | POA: Diagnosis not present

## 2023-07-21 DIAGNOSIS — I5032 Chronic diastolic (congestive) heart failure: Secondary | ICD-10-CM | POA: Diagnosis not present

## 2023-07-21 DIAGNOSIS — E876 Hypokalemia: Secondary | ICD-10-CM | POA: Diagnosis present

## 2023-07-21 DIAGNOSIS — T84624D Infection and inflammatory reaction due to internal fixation device of right fibula, subsequent encounter: Secondary | ICD-10-CM | POA: Diagnosis not present

## 2023-07-21 DIAGNOSIS — I959 Hypotension, unspecified: Secondary | ICD-10-CM | POA: Diagnosis present

## 2023-07-21 DIAGNOSIS — Z7401 Bed confinement status: Secondary | ICD-10-CM | POA: Diagnosis not present

## 2023-07-21 DIAGNOSIS — E1140 Type 2 diabetes mellitus with diabetic neuropathy, unspecified: Secondary | ICD-10-CM | POA: Diagnosis not present

## 2023-07-21 DIAGNOSIS — J449 Chronic obstructive pulmonary disease, unspecified: Secondary | ICD-10-CM | POA: Diagnosis not present

## 2023-07-21 DIAGNOSIS — Z7985 Long-term (current) use of injectable non-insulin antidiabetic drugs: Secondary | ICD-10-CM | POA: Diagnosis not present

## 2023-07-21 DIAGNOSIS — F331 Major depressive disorder, recurrent, moderate: Secondary | ICD-10-CM | POA: Diagnosis present

## 2023-07-21 DIAGNOSIS — I272 Pulmonary hypertension, unspecified: Secondary | ICD-10-CM | POA: Diagnosis present

## 2023-07-21 DIAGNOSIS — N184 Chronic kidney disease, stage 4 (severe): Secondary | ICD-10-CM | POA: Diagnosis present

## 2023-07-21 DIAGNOSIS — E1122 Type 2 diabetes mellitus with diabetic chronic kidney disease: Secondary | ICD-10-CM | POA: Diagnosis present

## 2023-07-21 DIAGNOSIS — M6281 Muscle weakness (generalized): Secondary | ICD-10-CM | POA: Diagnosis not present

## 2023-07-21 DIAGNOSIS — I129 Hypertensive chronic kidney disease with stage 1 through stage 4 chronic kidney disease, or unspecified chronic kidney disease: Secondary | ICD-10-CM | POA: Diagnosis present

## 2023-07-21 DIAGNOSIS — T8469XA Infection and inflammatory reaction due to internal fixation device of other site, initial encounter: Secondary | ICD-10-CM | POA: Diagnosis present

## 2023-07-21 DIAGNOSIS — E785 Hyperlipidemia, unspecified: Secondary | ICD-10-CM | POA: Diagnosis not present

## 2023-07-21 DIAGNOSIS — T8140XD Infection following a procedure, unspecified, subsequent encounter: Secondary | ICD-10-CM | POA: Diagnosis not present

## 2023-07-21 DIAGNOSIS — R7989 Other specified abnormal findings of blood chemistry: Secondary | ICD-10-CM | POA: Diagnosis present

## 2023-07-21 DIAGNOSIS — D631 Anemia in chronic kidney disease: Secondary | ICD-10-CM | POA: Diagnosis not present

## 2023-07-21 DIAGNOSIS — E039 Hypothyroidism, unspecified: Secondary | ICD-10-CM | POA: Diagnosis not present

## 2023-07-21 DIAGNOSIS — E1169 Type 2 diabetes mellitus with other specified complication: Secondary | ICD-10-CM | POA: Diagnosis present

## 2023-07-21 DIAGNOSIS — R6889 Other general symptoms and signs: Secondary | ICD-10-CM | POA: Diagnosis not present

## 2023-07-21 DIAGNOSIS — E1143 Type 2 diabetes mellitus with diabetic autonomic (poly)neuropathy: Secondary | ICD-10-CM | POA: Diagnosis present

## 2023-07-21 DIAGNOSIS — N400 Enlarged prostate without lower urinary tract symptoms: Secondary | ICD-10-CM | POA: Diagnosis present

## 2023-07-21 DIAGNOSIS — Z452 Encounter for adjustment and management of vascular access device: Secondary | ICD-10-CM | POA: Diagnosis not present

## 2023-07-21 DIAGNOSIS — K219 Gastro-esophageal reflux disease without esophagitis: Secondary | ICD-10-CM | POA: Diagnosis not present

## 2023-07-21 DIAGNOSIS — Z7902 Long term (current) use of antithrombotics/antiplatelets: Secondary | ICD-10-CM | POA: Diagnosis not present

## 2023-07-21 LAB — CBC WITH DIFFERENTIAL/PLATELET
Abs Immature Granulocytes: 0.02 10*3/uL (ref 0.00–0.07)
Basophils Absolute: 0.1 10*3/uL (ref 0.0–0.1)
Basophils Relative: 2 %
Eosinophils Absolute: 0.6 10*3/uL — ABNORMAL HIGH (ref 0.0–0.5)
Eosinophils Relative: 8 %
HCT: 30.9 % — ABNORMAL LOW (ref 39.0–52.0)
Hemoglobin: 10.2 g/dL — ABNORMAL LOW (ref 13.0–17.0)
Immature Granulocytes: 0 %
Lymphocytes Relative: 28 %
Lymphs Abs: 1.9 10*3/uL (ref 0.7–4.0)
MCH: 28.8 pg (ref 26.0–34.0)
MCHC: 33 g/dL (ref 30.0–36.0)
MCV: 87.3 fL (ref 80.0–100.0)
Monocytes Absolute: 0.5 10*3/uL (ref 0.1–1.0)
Monocytes Relative: 7 %
Neutro Abs: 3.7 10*3/uL (ref 1.7–7.7)
Neutrophils Relative %: 55 %
Platelets: 228 10*3/uL (ref 150–400)
RBC: 3.54 MIL/uL — ABNORMAL LOW (ref 4.22–5.81)
RDW: 15.8 % — ABNORMAL HIGH (ref 11.5–15.5)
WBC: 6.8 10*3/uL (ref 4.0–10.5)
nRBC: 0 % (ref 0.0–0.2)

## 2023-07-21 LAB — GLUCOSE, CAPILLARY
Glucose-Capillary: 134 mg/dL — ABNORMAL HIGH (ref 70–99)
Glucose-Capillary: 147 mg/dL — ABNORMAL HIGH (ref 70–99)
Glucose-Capillary: 239 mg/dL — ABNORMAL HIGH (ref 70–99)
Glucose-Capillary: 141 mg/dL — ABNORMAL HIGH (ref 70–99)

## 2023-07-21 LAB — PREALBUMIN: Prealbumin: 15 mg/dL — ABNORMAL LOW (ref 18–38)

## 2023-07-21 LAB — BASIC METABOLIC PANEL WITH GFR
Anion gap: 8 (ref 5–15)
BUN: 14 mg/dL (ref 8–23)
CO2: 22 mmol/L (ref 22–32)
Calcium: 8 mg/dL — ABNORMAL LOW (ref 8.9–10.3)
Chloride: 108 mmol/L (ref 98–111)
Creatinine, Ser: 1.9 mg/dL — ABNORMAL HIGH (ref 0.61–1.24)
GFR, Estimated: 38 mL/min — ABNORMAL LOW (ref >60)
Glucose, Bld: 118 mg/dL — ABNORMAL HIGH (ref 70–99)
Potassium: 3.5 mmol/L (ref 3.5–5.1)
Sodium: 138 mmol/L (ref 135–145)

## 2023-07-21 MED ORDER — MIDODRINE HCL 5 MG PO TABS: 5.0000 mg | ORAL_TABLET | Freq: Three times a day (TID) | ORAL | Status: DC | PRN

## 2023-07-21 MED ORDER — MIDODRINE HCL 5 MG PO TABS
10.0000 mg | ORAL_TABLET | Freq: Three times a day (TID) | ORAL | Status: DC
  Administered 2023-07-21 – 2023-07-22 (×4): 10 mg via ORAL
  Filled 2023-07-21 (×4): qty 2

## 2023-07-21 MED ORDER — ALTEPLASE 2 MG IJ SOLR
2.0000 mg | Freq: Once | INTRAMUSCULAR | Status: AC
  Administered 2023-07-21: 2 mg
  Filled 2023-07-21: qty 2

## 2023-07-21 NOTE — Progress Notes (Signed)
 Physical Therapy Treatment Patient Details Name: Harold [name] MRN: [id number] DOB: [birth year] Today's Date: 07/21/2023   History of Present Illness Pt is a 68 y/o male admitted from SNF rehab with reocurring orthostatic hypotension.  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    PT Comments  Pt was mildly encourage to see that although weak, he was not nearly as bad as he had thought prior to our resistance exercise from a bed level.  Emphasis after warm up exercise was on transition to EOB with extra time and struggle to scoot, then stand into the RW for >1.5 minutes with attempts at side stepping R toward [facility] with assist.    If plan is discharge home, recommend the following: Two people to help with walking and/or transfers;A lot of help with bathing/dressing/bathroom;Assistance with cooking/housework;Assist for transportation;Help with stairs or ramp for entrance   Can travel by private vehicle     No  Equipment Recommendations   (TBA)    Recommendations for Other Services       Precautions / Restrictions Precautions Precautions: Fall Recall of Precautions/Restrictions: Intact Restrictions RLE Weight Bearing Per Provider Order: Weight bearing as tolerated     Mobility  Bed Mobility Overal bed mobility: Needs Assistance Bed Mobility: Rolling, Sidelying to Sit, Sit to Supine Rolling: Min assist, Used rails Sidelying to sit: Mod assist (this time up via R rail and R elbow)   Sit to supine: Max assist   General bed mobility comments: significant difficulty getting his body in a good biomechanical position to push up to sitting.  Better accomplished to the right than Left    Transfers   Equipment used: Rolling walker (2 wheels) Transfers: Sit to/from Stand Sit  to Stand: Mod assist          Lateral/Scoot Transfers: Max assist General transfer comment: due to difficult sit to stand with orthostatic hypotension, aborted pivot transfer to a chair.  Pt needed max assist to side step R toward HOB with RW.  Difficulty with unilater w/bearing.    Ambulation/Gait                   Stairs             Wheelchair Mobility     Tilt Bed    Modified Rankin (Stroke Patients Only)       Balance Overall balance assessment: Needs assistance Sitting-balance support: Bilateral upper extremity supported, Feet supported, Feet unsupported, No upper extremity supported Sitting balance-Leahy Scale: Fair Sitting balance - Comments: prefers UE assist, but can maintain sitting EOB without UE's Postural control: Posterior lean Standing balance support: Bilateral upper extremity supported, During functional activity, Reliant on assistive device for balance Standing balance-Leahy Scale: Poor Standing balance comment: Reliant on RW, with posterior bias.                            Communication Communication Communication: No apparent difficulties  Cognition Arousal: Alert Behavior During Therapy: WFL for tasks assessed/performed   PT - Cognitive impairments: No family/caregiver present to determine baseline                         Following commands: Intact      Cueing Cueing Techniques: Verbal  cues  Exercises Other Exercises Other Exercises: bil hip/knee flexion/ext with graded resistancex 10 reps    General Comments        Pertinent Vitals/Pain Pain Assessment Pain Assessment: Faces Faces Pain Scale: Hurts a little bit Pain Location: general , R ankle Pain Descriptors / Indicators: Sore Pain Intervention(s): Monitored during session    Home Living                          Prior Function            PT Goals (current goals can now be found in the care plan section) Acute Rehab PT  Goals Patient Stated Goal: return home, decreased pain PT Goal Formulation: With patient Time For Goal Achievement: 08/01/23 Potential to Achieve Goals: Fair    Frequency           PT Plan      Co-evaluation              AM-PAC PT "6 Clicks" Mobility   Outcome Measure  Help needed turning from your back to your side while in a flat bed without using bedrails?: A Little Help needed moving from lying on your back to sitting on the side of a flat bed without using bedrails?: A Lot Help needed moving to and from a bed to a chair (including a wheelchair)?: A Lot Help needed standing up from a chair using your arms (e.g., wheelchair or bedside chair)?: Total Help needed to walk in hospital room?: Total Help needed climbing 3-5 steps with a railing? : Total 6 Click Score: 10    End of Session   Activity Tolerance: Treatment limited secondary to medical complications (Comment) (orthostasis) Patient left: in bed;with call bell/phone within reach Nurse Communication: Mobility status PT Visit Diagnosis: Other abnormalities of gait and mobility (R26.89);Pain Pain - Right/Left: Right Pain - part of body: Ankle and joints of foot     Time: 1220-1252 PT Time Calculation (min) (ACUTE ONLY): 32 min  Charges:    $Therapeutic Exercise: 8-22 mins $Therapeutic Activity: 8-22 mins PT General Charges $$ ACUTE PT VISIT: 1 Visit                     07/21/2023  Nohemi Batters., PT Acute Rehabilitation Services [phone]  (office)   Durell Gilding Saranne Crislip 07/21/2023, 4:47 PM

## 2023-07-21 NOTE — TOC Progression Note (Addendum)
 Transition of Care [facility]) - Progression Note    Patient Details  Name: Harold [name] MRN: [id number] Date of Birth: 05/14/1955  Transition of [facility] CM/SW Contact  Arron Big, [place] Phone Number: 07/21/2023, 12:53 PM  Clinical Narrative:   Per Olga Berthold, ins auth approved for [facility], approval dates:  07/21/2023-07/23/2023. CSW notified MD.   1:47 PM [facility] checking on male bed availability but anticipate having a male bed ready tomorrow. CSW will follow up. MD notified.   1:50 PM CSW notified [name], pts spouse.   TOC will continue to follow.    Expected Discharge Plan: Skilled Nursing Facility Barriers to Discharge: Continued Medical Work up, English as a second language teacher  Expected Discharge Plan and Services In-house Referral: Clinical Social Work     Living arrangements for the past 2 months: Skilled Nursing Facility                                       Social Determinants of Health (SDOH) Interventions SDOH Screenings   Food Insecurity: No Food Insecurity (07/17/2023)  Housing: Low Risk  (07/17/2023)  Transportation Needs: No Transportation Needs (07/17/2023)  Utilities: Not At Risk (07/17/2023)  Depression (PHQ2-9): Low Risk  (07/17/2023)  Social Connections: Moderately Isolated (07/17/2023)  Tobacco Use: Medium Risk (07/17/2023)    Readmission Risk Interventions     No data to display

## 2023-07-21 NOTE — Progress Notes (Signed)
 Progress Note   Patient: Harold [name] XBJ:[id number] DOB: [birth year] DOA: 07/17/2023     0 DOS: the patient was seen and examined on 07/21/2023   Brief hospital course: [name] is a 68 y.o. male with past medical history  of  coronary artery disease status post PCI, chronic kidney disease stage IV, COPD, PAH, OSA, diabetes mellitus with neuropathy, chronic venous insufficiency, orthostatic hypotension, hyperlipidemia, diabetic foot ulcer, traumatic right ankle fracture on 12/2022 requiring hardware placement complicated by need for multiple surgeries and infection with Enterobacter cloacae bacteremia and recent right ankle arthrodesis on 05/15/2023 currently being followed by infectious disease and saw them today and drainage has resolved. The patient was sent to the ED from ID office due to hypotension. The patient apparently had passed out in the days previous to admission. She states that she has had intermittent issues with lightheadedness since November of last year. She was given florinef  and midodrine  in the ED with resultant hypertension. Florinef  has not been continued. The patient's most recent echocardiogram was performed in 03/2023 and demonstrated an Ef of 55-60% with normal function and regional wall motion abnormalities with basal inferior hypokinesis. He has Grade II diastolic dysfunction. There is normal RV systolic function with mildly enlarged RV size and mildly elevated pulmonary artery systolic pressure. RVSP was 38.3 mmHg. There is moderately dilated left atrial size and mildly dilated right atrial size.  DDx includes autonomic neuropathy due to his longstanding history of diabetes; adrenal insufficiency, sepsis, PE, Medications.  Cosyntropin  stimulation test was negative. Infectious disease has been consulted.  This morning while nursing was attempting to get orthostatics the patient was unable to stand to complete them due to dizziness. Blood pressure at that  time was highly orthostatic at 110/65. His prior blood pressure was 147/90. . The patient was given a one liter bolus. I discussed with him options for treating what is most likely autonomic dysregulation. He states that compression hose give him ulcers on his feet. He states that an abdominal binder doesn't help. I will increase his midodrine  to 15 mg tid.  One episode of hypotension on 07/18/2023 1300-1500 . None recorded since.  Continue midodrine .  Assessment and Plan:  Hypotension/Syncope Differentials include: Autonomic neuropathy-secondary to his longstanding history of diabetes, will continue with midodrine . Florinef  has been discontinued. Home flomax  held but not sure he's been taking. Home lasix  also held  Adrenal Insufficiency? - ruled out with normal cosyntropin  stim test  Chronic infection of ankle. Blood cultures are have had no growth. D Dimer was positive, but PE is unllikely given the patient's ongoing anticoagulation with eliquis . He is continued on his chronic ertapenem  and daptomycin . WBC is within normal range. Infectious disease input is appreciated. - Unlikely ID consulted. Dr. Gillian Lacrosse has stated that his foot has no acute signs of infection. He states only that he wants for the patien to follow up with them in their office before then end date of his antibiotics on 08/02/2023.   Depression:  The patient is complaining of depression. Psychiatry consulted. They have increased his Cymbalta  to 60 mg daily. Zoloft  re-started Gabapentin  100 mg tid has been started for anxiety. He does not meet criteria for inpatient treatment, and is not considered a danger to himself or others.   Hypothyroidism: Continue levothyroxine . TSH was 6.242, but T4 was in normal range at 0.83. This is likely due to non-thyroidal illness.  Diabetes mellitus type 2: Consistent carb diet, glycemic protocol and sliding scale insulin  regimen management.  OSA on CPAP/ pulmonary HTN: Cont cpap at night  per home settings.    PAD: Patient is to have a repeat ABI and lower extremity arterial duplex per cardiology note.   Persistent A-fib: Currently in sinus rhythm on exam patient is on amiodarone  which we will continue along with Eliquis .   BPH: Flomax  held   Rt Ankle hardware infection: Pharmacy consult  to continue  ertapenem  and daptomycin . Infectious disease input is appreciated. ID f/u later this month as scheduled       Subjective: The patient is resting comfortably. No new complaints. Tolerating diet  Physical Exam: Vitals:   07/21/23 0045 07/21/23 0351 07/21/23 0745 07/21/23 1130  BP: 110/80 (!) 149/89 (!) 160/79 (!) 157/91  Pulse: 66 65 68 70  Resp: 20 20 18 18   Temp: (!) 97.5 F (36.4 C) (!) 97.5 F (36.4 C) 97.7 F (36.5 C) 97.7 F (36.5 C)  TempSrc: Oral Oral Oral Oral  SpO2: 100% 99% 100% 100%  Weight:  102.2 kg    Height:       Exam:  Constitutional:  The patient is awake, alert, and oriented x 3. No acute distress. Respiratory:  No increased work of breathing. No wheezes, rales, or rhonchi Cardiovascular:  Regular rate and rhythm No murmurs, ectopy, or gallups. Abdomen:  Abdomen is soft, non-tender, non-distended Musculoskeletal:  No cyanosis, clubbing, or edema Skin:  No visible rashes, lesions, ulcers  Neurologic:  Moving all 4 Psychiatric:  Mental status Mood, affect appropriate Orientation to person, place, time  judgment and insight appear intact  Data Reviewed:  CBC BMP  Family Communication: husband george updated telephonically 6/2  Disposition: Status is: inpt The patient remains OBS appropriate and will d/c before 2 midnights.  Planned Discharge Destination: snf   Author: Raymonde Calico, MD 07/21/2023 1:54 PM  For on call review [email or web address].

## 2023-07-22 DIAGNOSIS — E43 Unspecified severe protein-calorie malnutrition: Secondary | ICD-10-CM | POA: Diagnosis not present

## 2023-07-22 DIAGNOSIS — I4581 Long QT syndrome: Secondary | ICD-10-CM | POA: Diagnosis not present

## 2023-07-22 DIAGNOSIS — R262 Difficulty in walking, not elsewhere classified: Secondary | ICD-10-CM | POA: Diagnosis not present

## 2023-07-22 DIAGNOSIS — N179 Acute kidney failure, unspecified: Secondary | ICD-10-CM | POA: Diagnosis present

## 2023-07-22 DIAGNOSIS — Z951 Presence of aortocoronary bypass graft: Secondary | ICD-10-CM | POA: Diagnosis not present

## 2023-07-22 DIAGNOSIS — L602 Onychogryphosis: Secondary | ICD-10-CM | POA: Diagnosis not present

## 2023-07-22 DIAGNOSIS — Z794 Long term (current) use of insulin: Secondary | ICD-10-CM | POA: Diagnosis not present

## 2023-07-22 DIAGNOSIS — L97314 Non-pressure chronic ulcer of right ankle with necrosis of bone: Secondary | ICD-10-CM | POA: Diagnosis not present

## 2023-07-22 DIAGNOSIS — Z7401 Bed confinement status: Secondary | ICD-10-CM | POA: Diagnosis not present

## 2023-07-22 DIAGNOSIS — K219 Gastro-esophageal reflux disease without esophagitis: Secondary | ICD-10-CM | POA: Diagnosis not present

## 2023-07-22 DIAGNOSIS — N170 Acute kidney failure with tubular necrosis: Secondary | ICD-10-CM | POA: Diagnosis not present

## 2023-07-22 DIAGNOSIS — J449 Chronic obstructive pulmonary disease, unspecified: Secondary | ICD-10-CM | POA: Diagnosis present

## 2023-07-22 DIAGNOSIS — I5032 Chronic diastolic (congestive) heart failure: Secondary | ICD-10-CM | POA: Diagnosis not present

## 2023-07-22 DIAGNOSIS — E11621 Type 2 diabetes mellitus with foot ulcer: Secondary | ICD-10-CM | POA: Diagnosis not present

## 2023-07-22 DIAGNOSIS — E1165 Type 2 diabetes mellitus with hyperglycemia: Secondary | ICD-10-CM | POA: Diagnosis not present

## 2023-07-22 DIAGNOSIS — I272 Pulmonary hypertension, unspecified: Secondary | ICD-10-CM | POA: Diagnosis present

## 2023-07-22 DIAGNOSIS — I739 Peripheral vascular disease, unspecified: Secondary | ICD-10-CM | POA: Diagnosis not present

## 2023-07-22 DIAGNOSIS — R531 Weakness: Secondary | ICD-10-CM | POA: Diagnosis not present

## 2023-07-22 DIAGNOSIS — Z7409 Other reduced mobility: Secondary | ICD-10-CM | POA: Diagnosis not present

## 2023-07-22 DIAGNOSIS — E639 Nutritional deficiency, unspecified: Secondary | ICD-10-CM | POA: Diagnosis not present

## 2023-07-22 DIAGNOSIS — E860 Dehydration: Secondary | ICD-10-CM | POA: Diagnosis present

## 2023-07-22 DIAGNOSIS — I480 Paroxysmal atrial fibrillation: Secondary | ICD-10-CM | POA: Diagnosis not present

## 2023-07-22 DIAGNOSIS — I951 Orthostatic hypotension: Secondary | ICD-10-CM | POA: Diagnosis not present

## 2023-07-22 DIAGNOSIS — T84624D Infection and inflammatory reaction due to internal fixation device of right fibula, subsequent encounter: Secondary | ICD-10-CM | POA: Diagnosis not present

## 2023-07-22 DIAGNOSIS — N1832 Chronic kidney disease, stage 3b: Secondary | ICD-10-CM | POA: Diagnosis present

## 2023-07-22 DIAGNOSIS — L97509 Non-pressure chronic ulcer of other part of unspecified foot with unspecified severity: Secondary | ICD-10-CM | POA: Diagnosis not present

## 2023-07-22 DIAGNOSIS — F411 Generalized anxiety disorder: Secondary | ICD-10-CM | POA: Diagnosis present

## 2023-07-22 DIAGNOSIS — N400 Enlarged prostate without lower urinary tract symptoms: Secondary | ICD-10-CM | POA: Diagnosis not present

## 2023-07-22 DIAGNOSIS — G629 Polyneuropathy, unspecified: Secondary | ICD-10-CM | POA: Diagnosis not present

## 2023-07-22 DIAGNOSIS — Z743 Need for continuous supervision: Secondary | ICD-10-CM | POA: Diagnosis not present

## 2023-07-22 DIAGNOSIS — R7881 Bacteremia: Secondary | ICD-10-CM | POA: Diagnosis present

## 2023-07-22 DIAGNOSIS — I872 Venous insufficiency (chronic) (peripheral): Secondary | ICD-10-CM | POA: Diagnosis not present

## 2023-07-22 DIAGNOSIS — R112 Nausea with vomiting, unspecified: Secondary | ICD-10-CM | POA: Diagnosis not present

## 2023-07-22 DIAGNOSIS — I2510 Atherosclerotic heart disease of native coronary artery without angina pectoris: Secondary | ICD-10-CM | POA: Diagnosis present

## 2023-07-22 DIAGNOSIS — T8140XD Infection following a procedure, unspecified, subsequent encounter: Secondary | ICD-10-CM | POA: Diagnosis not present

## 2023-07-22 DIAGNOSIS — F32A Depression, unspecified: Secondary | ICD-10-CM | POA: Diagnosis present

## 2023-07-22 DIAGNOSIS — Z0000 Encounter for general adult medical examination without abnormal findings: Secondary | ICD-10-CM | POA: Diagnosis not present

## 2023-07-22 DIAGNOSIS — E876 Hypokalemia: Secondary | ICD-10-CM | POA: Diagnosis present

## 2023-07-22 DIAGNOSIS — R6889 Other general symptoms and signs: Secondary | ICD-10-CM | POA: Diagnosis not present

## 2023-07-22 DIAGNOSIS — M6281 Muscle weakness (generalized): Secondary | ICD-10-CM | POA: Diagnosis not present

## 2023-07-22 DIAGNOSIS — G4733 Obstructive sleep apnea (adult) (pediatric): Secondary | ICD-10-CM | POA: Diagnosis not present

## 2023-07-22 DIAGNOSIS — E039 Hypothyroidism, unspecified: Secondary | ICD-10-CM | POA: Diagnosis present

## 2023-07-22 DIAGNOSIS — E1122 Type 2 diabetes mellitus with diabetic chronic kidney disease: Secondary | ICD-10-CM | POA: Diagnosis present

## 2023-07-22 DIAGNOSIS — L84 Corns and callosities: Secondary | ICD-10-CM | POA: Diagnosis not present

## 2023-07-22 DIAGNOSIS — L603 Nail dystrophy: Secondary | ICD-10-CM | POA: Diagnosis not present

## 2023-07-22 DIAGNOSIS — E1140 Type 2 diabetes mellitus with diabetic neuropathy, unspecified: Secondary | ICD-10-CM | POA: Diagnosis present

## 2023-07-22 DIAGNOSIS — G909 Disorder of the autonomic nervous system, unspecified: Secondary | ICD-10-CM | POA: Diagnosis not present

## 2023-07-22 DIAGNOSIS — I4819 Other persistent atrial fibrillation: Secondary | ICD-10-CM | POA: Diagnosis present

## 2023-07-22 DIAGNOSIS — Z4889 Encounter for other specified surgical aftercare: Secondary | ICD-10-CM | POA: Diagnosis not present

## 2023-07-22 DIAGNOSIS — D649 Anemia, unspecified: Secondary | ICD-10-CM | POA: Diagnosis not present

## 2023-07-22 DIAGNOSIS — T847XXA Infection and inflammatory reaction due to other internal orthopedic prosthetic devices, implants and grafts, initial encounter: Secondary | ICD-10-CM | POA: Diagnosis not present

## 2023-07-22 DIAGNOSIS — N1831 Chronic kidney disease, stage 3a: Secondary | ICD-10-CM | POA: Diagnosis not present

## 2023-07-22 DIAGNOSIS — E669 Obesity, unspecified: Secondary | ICD-10-CM | POA: Diagnosis present

## 2023-07-22 DIAGNOSIS — M869 Osteomyelitis, unspecified: Secondary | ICD-10-CM | POA: Diagnosis not present

## 2023-07-22 DIAGNOSIS — Z792 Long term (current) use of antibiotics: Secondary | ICD-10-CM | POA: Diagnosis not present

## 2023-07-22 DIAGNOSIS — I10 Essential (primary) hypertension: Secondary | ICD-10-CM | POA: Diagnosis not present

## 2023-07-22 DIAGNOSIS — D631 Anemia in chronic kidney disease: Secondary | ICD-10-CM | POA: Diagnosis present

## 2023-07-22 DIAGNOSIS — I129 Hypertensive chronic kidney disease with stage 1 through stage 4 chronic kidney disease, or unspecified chronic kidney disease: Secondary | ICD-10-CM | POA: Diagnosis present

## 2023-07-22 DIAGNOSIS — E119 Type 2 diabetes mellitus without complications: Secondary | ICD-10-CM | POA: Diagnosis not present

## 2023-07-22 DIAGNOSIS — R9431 Abnormal electrocardiogram [ECG] [EKG]: Secondary | ICD-10-CM | POA: Diagnosis present

## 2023-07-22 DIAGNOSIS — E1151 Type 2 diabetes mellitus with diabetic peripheral angiopathy without gangrene: Secondary | ICD-10-CM | POA: Diagnosis present

## 2023-07-22 DIAGNOSIS — I270 Primary pulmonary hypertension: Secondary | ICD-10-CM | POA: Diagnosis not present

## 2023-07-22 DIAGNOSIS — E038 Other specified hypothyroidism: Secondary | ICD-10-CM | POA: Diagnosis not present

## 2023-07-22 DIAGNOSIS — E785 Hyperlipidemia, unspecified: Secondary | ICD-10-CM | POA: Diagnosis present

## 2023-07-22 DIAGNOSIS — M86171 Other acute osteomyelitis, right ankle and foot: Secondary | ICD-10-CM | POA: Diagnosis not present

## 2023-07-22 LAB — CULTURE, BLOOD (ROUTINE X 2)
Culture: NO GROWTH
Culture: NO GROWTH

## 2023-07-22 LAB — BASIC METABOLIC PANEL WITH GFR
Anion gap: 7 (ref 5–15)
BUN: 11 mg/dL (ref 8–23)
CO2: 24 mmol/L (ref 22–32)
Calcium: 8.2 mg/dL — ABNORMAL LOW (ref 8.9–10.3)
Chloride: 110 mmol/L (ref 98–111)
Creatinine, Ser: 1.67 mg/dL — ABNORMAL HIGH (ref 0.61–1.24)
GFR, Estimated: 44 mL/min — ABNORMAL LOW (ref >60)
Glucose, Bld: 136 mg/dL — ABNORMAL HIGH (ref 70–99)
Potassium: 3.5 mmol/L (ref 3.5–5.1)
Sodium: 141 mmol/L (ref 135–145)

## 2023-07-22 LAB — GLUCOSE, CAPILLARY
Glucose-Capillary: 135 mg/dL — ABNORMAL HIGH (ref 70–99)
Glucose-Capillary: 122 mg/dL — ABNORMAL HIGH (ref 70–99)

## 2023-07-22 MED ORDER — HEPARIN SOD (PORK) LOCK FLUSH 100 UNIT/ML IV SOLN
250.0000 [IU] | INTRAVENOUS | Status: AC | PRN
Start: 2023-07-22 — End: 2023-07-22
  Administered 2023-07-22: 250 [IU]

## 2023-07-22 MED ORDER — DULOXETINE HCL 20 MG PO CPEP: 20.0000 mg | ORAL_CAPSULE | Freq: Every day | ORAL | Status: DC

## 2023-07-22 MED ORDER — LANTUS SOLOSTAR 100 UNIT/ML ~~LOC~~ SOPN: 5.0000 [IU] | PEN_INJECTOR | Freq: Every day | SUBCUTANEOUS | 11 refills | Status: DC

## 2023-07-22 MED ORDER — SERTRALINE HCL 100 MG PO TABS: 100.0000 mg | ORAL_TABLET | Freq: Every day | ORAL | Status: DC

## 2023-07-22 NOTE — Plan of Care (Signed)
  Problem: Education: Goal: Knowledge of General Education information will improve Description: Including pain rating scale, medication(s)/side effects and non-pharmacologic comfort measures Outcome: Progressing   Problem: Clinical Measurements: Goal: Respiratory complications will improve Outcome: Progressing Goal: Cardiovascular complication will be avoided Outcome: Progressing   Problem: Pain Managment: Goal: General experience of comfort will improve and/or be controlled Outcome: Progressing   Problem: Activity: Goal: Risk for activity intolerance will decrease Outcome: Not Progressing

## 2023-07-22 NOTE — Discharge Summary (Signed)
 Harold [name] ZOX:[id number] DOB: February 17, 1956 DOA: 07/17/2023  PCP: Tita Form, MD  Admit date: 07/17/2023 Discharge date: 07/22/2023  Time spent: 35 minutes  Recommendations for Outpatient Follow-up:  Pcp f/u ID f/u prior to 6/14 Cardiology f/u (scheduled)     Discharge Diagnoses:  Principal Problem:   Hypotension Active Problems:   Major depressive disorder, recurrent episode, moderate (HCC)   Discharge Condition: stable  Diet recommendation: heart healthy  Filed Weights   07/19/23 0348 07/21/23 0351 07/22/23 0127  Weight: 102.8 kg 102.2 kg 102.8 kg    History of present illness:  From admission h and p SYE SCHROEPFER is a 68 y.o. male with past medical history  of  coronary artery disease status post PCI, chronic kidney disease stage IV, COPD, PAH, OSA, diabetes mellitus with neuropathy, chronic venous insufficiency, orthostatic hypotension, hyperlipidemia, diabetic foot ulcer, traumatic right ankle fracture on 12/2022 requiring hardware placement complicated by need for multiple surgeries and infection with Enterobacter cloacae bacteremia and recent right ankle arthrodesis on 05/15/2023 currently being followed by infectious disease and saw them today and drainage has resolved.   Hospital Course:   Hypotension/Syncope Autonomic neuropathy-secondary to his longstanding history of diabetes, will continue with midodrine  and fludricortisone which was started recently by cardiology.  Home flomax  held but not sure he's been taking. Home lasix  also held. Will need close f/u with cardiology   Adrenal Insufficiency? - ruled out with normal cosyntropin  stim test   Chronic infection of ankle. Blood cultures are have had no growth. D Dimer was positive, but PE is unllikely given the patient's ongoing anticoagulation with eliquis . He is continued on his chronic ertapenem  and daptomycin . WBC is within normal range. Infectious disease input is appreciated. - Unlikely ID  consulted. Dr. Gillian Lacrosse has stated that his foot has no acute signs of infection. He states only that he wants for the patien to follow up with them in their office before then end date of his antibiotics on 08/02/2023.   Depression:  The patient is complaining of depression. Psychiatry consulted. Zoloft  re-started. He does not meet criteria for inpatient treatment, and is not considered a danger to himself or others.   Hypothyroidism: Continue levothyroxine . TSH was 6.242, but T4 was in normal range at 0.83.    Diabetes mellitus type 2: Consistent carb diet, glycemic protocol and sliding scale insulin  regimen management. Will decrease nightly long-acting at d/c   OSA on CPAP/ pulmonary HTN: Cont cpap at night per home settings.    Persistent A-fib: Currently in sinus rhythm on exam patient is on amiodarone  which we continued along with Eliquis .   BPH: Flomax  held though not clear he has been taking. Continue proscar   Rt Ankle hardware infection: Pharmacy consult  to continue  ertapenem  and daptomycin . Infectious disease input is appreciated. ID f/u later this month as scheduled, prior to abx stop date of 6/14  Procedures: none   Consultations: none  Discharge Exam: Vitals:   07/22/23 0337 07/22/23 0711  BP: (!) 162/51 (!) 153/57  Pulse: 67 68  Resp: 18 18  Temp: 97.6 F (36.4 C) (!) 97.5 F (36.4 C)  SpO2: 97% 99%    General: NAD Cardiovascular: RRR Respiratory: CTAB Ext: stable swelling right ankle  Discharge Instructions   Discharge Instructions     Diet - low sodium heart healthy   Complete by: As directed    Discharge wound care:   Complete by: As directed    Standard decubitus ulcer wound care  Increase activity slowly   Complete by: As directed       Allergies as of 07/22/2023       Reactions   Beta-lactamase Inhibitors (beta-lactam) Hives, Itching, Rash   AX - penicillin     Ancef  given 9/24 with no obvious reaction   Metformin  And Related  Diarrhea, Nausea Only   Other Nausea And Vomiting   General Anesthesia - sometimes causes nausea and vomiting * OK to use scopolamine  patch     Sulfa Antibiotics Hives, Other (See Comments)   Bactrim   Wellbutrin [bupropion] Other (See Comments)   Mood Changes   Tetracyclines & Related Hives, Rash   doxycycline         Medication List     PAUSE taking these medications    furosemide  40 MG tablet Wait to take this until your doctor or other care provider tells you to start again. Commonly known as: LASIX  Take 40 mg by mouth daily.       STOP taking these medications    insulin  aspart 100 UNIT/ML injection Commonly known as: novoLOG    leptospermum manuka honey Pste paste   oxyCODONE  5 MG immediate release tablet Commonly known as: Oxy IR/ROXICODONE    tamsulosin  0.4 MG Caps capsule Commonly known as: FLOMAX        TAKE these medications    acetaminophen  500 MG tablet Commonly known as: TYLENOL  Take 1 tablet (500 mg total) by mouth every 6 (six) hours as needed for mild pain (pain score 1-3).   allopurinol  100 MG tablet Commonly known as: ZYLOPRIM  Take 0.5 tablets (50 mg total) by mouth daily.   amiodarone  200 MG tablet Commonly known as: PACERONE  Take 1 tablet (200 mg total) by mouth 2 (two) times daily.   atorvastatin  40 MG tablet Commonly known as: LIPITOR Take 1 tablet (40 mg total) by mouth daily. What changed: when to take this   clopidogrel  75 MG tablet Commonly known as: PLAVIX  Take 75 mg by mouth every morning.   daptomycin  IVPB Commonly known as: CUBICIN  Inject 800 mg into the vein daily. Indication:  Hardware associated ankle osteomyelitis  First Dose: Yes Last Day of Therapy:  08/02/23 Labs - Once weekly:  CBC/D, BMP, and CPK Labs - Once weekly: ESR and CRP   docusate sodium  100 MG capsule Commonly known as: COLACE Take 1 capsule (100 mg total) by mouth 2 (two) times daily.   DULoxetine  20 MG capsule Commonly known as: CYMBALTA  Take  1 capsule (20 mg total) by mouth daily. What changed:  medication strength how much to take   Eliquis  5 MG Tabs tablet Generic drug: apixaban  TAKE 1 TABLET BY MOUTH TWICE A DAY   ertapenem  IVPB Commonly known as: INVANZ  Inject 1 g into the vein daily. Indication:  Hardware associated ankle osteomyelitis  First Dose: Yes Last Day of Therapy:  08/02/23 Labs - Once weekly:  CBC/D and BMP, Labs - Once weekly: ESR and CRP   ezetimibe  10 MG tablet Commonly known as: ZETIA  Take 10 mg by mouth at bedtime.   ferrous sulfate  325 (65 FE) MG tablet Take 325 mg by mouth 3 (three) times a week. Monday, Wednesday, Friday   finasteride 5 MG tablet Commonly known as: PROSCAR Take 5 mg by mouth daily.   fludrocortisone  0.1 MG tablet Commonly known as: FLORINEF  Take 1 tablet (0.1 mg total) by mouth daily.   FreeStyle Libre 3 Plus Sensor Misc Inject 1 Device into the skin every 14 (fourteen) days.   gabapentin  300  MG capsule Commonly known as: NEURONTIN  Take 1 capsule (300 mg total) by mouth at bedtime.   Gerhardt's butt cream Crea Apply 1 Application topically 2 (two) times daily.   insulin  lispro 100 UNIT/ML injection Commonly known as: HUMALOG Inject 0-15 Units into the skin in the morning, at noon, in the evening, and at bedtime. Per sliding scale: 0-70= 0 units if <70 implement hypoglycemic protocol & notify MD/NP 71-150= 0 units 151-200=3 units 201-250= 6 units 251-300= 9 units 301-350= 12 units 351-400= 15 units 401-999 if CBG>400. Notify MD/ NP,   Lantus  SoloStar 100 UNIT/ML Solostar Pen Generic drug: insulin  glargine Inject 5 Units into the skin at bedtime. What changed: how much to take   levothyroxine  88 MCG tablet Commonly known as: SYNTHROID  Take 88 mcg by mouth daily before breakfast.   magnesium  oxide 400 MG tablet Commonly known as: MAG-OX Take 1 tablet by mouth daily.   methocarbamol  500 MG tablet Commonly known as: ROBAXIN  Take 1 tablet (500 mg total)  by mouth every 6 (six) hours as needed for muscle spasms.   midodrine  10 MG tablet Commonly known as: PROAMATINE  Take 1 tablet (10 mg total) by mouth 3 (three) times daily with meals. Take one hour prior to physical therapy   Mounjaro  15 MG/0.5ML Pen Generic drug: tirzepatide  Inject 15 mg into the skin once a week. What changed: additional instructions   multivitamin with minerals Tabs tablet Take 1 tablet by mouth daily.   nitroGLYCERIN  0.4 MG SL tablet Commonly known as: NITROSTAT  PLACE 1 TAB UNDER THE TONGUE EVERY 5 MINUTES AS NEEDED FOR CHEST PAIN (SEVERE PRESSURE OR TIGHTNESS) What changed: See the new instructions.   nutrition supplement (JUVEN) Pack Take 1 packet by mouth 2 (two) times daily between meals.   pantoprazole  40 MG tablet Commonly known as: PROTONIX  TAKE 1 TABLET BY MOUTH EVERY DAY   polyethylene glycol 17 g packet Commonly known as: MIRALAX  / GLYCOLAX  Take 17 g by mouth daily as needed for mild constipation.   PRO-STAT PO Take 30 mLs by mouth in the morning and at bedtime.   sertraline  100 MG tablet Commonly known as: ZOLOFT  Take 1 tablet (100 mg total) by mouth daily. What changed:  medication strength how much to take   Vitamin D  (Ergocalciferol ) 1.25 MG (50000 UNIT) Caps capsule Commonly known as: DRISDOL  Take 1 capsule (50,000 Units total) by mouth every 7 (seven) days. What changed: additional instructions               Discharge Care Instructions  (From admission, onward)           Start     Ordered   07/22/23 0000  Discharge wound care:       Comments: Standard decubitus ulcer wound care   07/22/23 1052           Allergies  Allergen Reactions   Beta-Lactamase Inhibitors (Beta-Lactam) Hives, Itching and Rash    AX - penicillin     Ancef  given 9/24 with no obvious reaction   Metformin  And Related Diarrhea and Nausea Only   Other Nausea And Vomiting    General Anesthesia - sometimes causes nausea and vomiting * OK to  use scopolamine  patch     Sulfa Antibiotics Hives and Other (See Comments)    Bactrim   Wellbutrin [Bupropion] Other (See Comments)    Mood Changes   Tetracyclines & Related Hives and Rash    doxycycline     Follow-up Information     Amin,  Dann Dust, MD Follow up.   Specialty: Internal Medicine Contact information: [address] [place] [address] [phone]         Terre Ferri, MD Follow up.   Specialty: Infectious Diseases Contact information: [address] [place] [address] [phone]         Wenona Hamilton, MD Follow up.   Specialty: Cardiology Contact information: [address] [place] [address] [phone]                  The results of significant diagnostics from this hospitalization (including imaging, microbiology, ancillary and laboratory) are listed below for reference.    Significant Diagnostic Studies: DG Chest Port 1 View Result Date: 07/21/2023 CLINICAL DATA:  PICC line placement EXAM: PORTABLE CHEST 1 VIEW COMPARISON:  Jul 17, 2023 FINDINGS: The heart size and mediastinal contours are within normal limits. Both lungs are clear. The visualized skeletal structures are unremarkable. Right PICC line tip in the SVC with position. IMPRESSION: No active disease. Electronically Signed   By: Fredrich Jefferson M.D.   On: 07/21/2023 09:02   DG Chest Port 1 View Result Date: 07/17/2023 CLINICAL DATA:  Weakness. EXAM: PORTABLE CHEST 1 VIEW COMPARISON:  05/26/2023 FINDINGS: Normal sized heart with an interval decrease in size. Clear lungs with normal vascularity. Thoracic spine degenerative changes. IMPRESSION: No acute abnormality. Electronically Signed   By: Catherin Closs M.D.   On: 07/17/2023 14:40   US  EKG SITE RITE Result Date: 06/24/2023 If Site Rite image not attached, placement could not be confirmed due to current cardiac rhythm.   Microbiology: Recent Results (from the past 240 hours)  Blood culture  (routine x 2)     Status: None   Collection Time: 07/17/23 12:11 PM   Specimen: BLOOD  Result Value Ref Range Status   Specimen Description BLOOD SITE NOT SPECIFIED  Final   Special Requests   Final    BOTTLES DRAWN AEROBIC AND ANAEROBIC Blood Culture results may not be optimal due to an inadequate volume of blood received in culture bottles   Culture   Final    NO GROWTH 5 DAYS Performed at [facility], [address], [place], [place] [address]    Report Status 07/22/2023 FINAL  Final  Blood culture (routine x 2)     Status: None   Collection Time: 07/17/23  1:43 PM   Specimen: BLOOD LEFT HAND  Result Value Ref Range Status   Specimen Description BLOOD LEFT HAND  Final   Special Requests   Final    BOTTLES DRAWN AEROBIC ONLY Blood Culture results may not be optimal due to an inadequate volume of blood received in culture bottles   Culture   Final    NO GROWTH 5 DAYS Performed at [facility], [address] [place], [place] [address]    Report Status 07/22/2023 FINAL  Final     Labs: Basic Metabolic Panel: Recent Labs  Lab 07/17/23 2135 07/17/23 2145 07/18/23 0227 07/19/23 1010 07/20/23 0500 07/20/23 0514 07/21/23 0249 07/22/23 0616  NA  --  140 139 137 138  --  138 141  K  --  4.0 3.4* 3.2* 3.0*  --  3.5 3.5  CL  --  104 104 104 105  --  108 110  CO2  --  21* 24 22 23   --  22 24  GLUCOSE  --  121* 110* 149* 103*  --  118* 136*  BUN  --  25* 25* 18 18  --  14 11  CREATININE  --  2.15* 2.25* 2.03* 2.04*  --  1.90* 1.67*  CALCIUM   --  8.4* 8.1* 8.1* 8.1*  --  8.0* 8.2*  MG 1.9  --  1.9  --   --  2.0  --   --   PHOS  --  3.3  --   --   --   --   --   --    Liver Function Tests: Recent Labs  Lab 07/17/23 1238 07/17/23 2145 07/18/23 0227  AST 39  --  36  ALT 16  --  15  ALKPHOS 128*  --  109  BILITOT 0.6  --  0.8  PROT 6.0*  --  5.3*  ALBUMIN  2.2* 2.1* 1.9*   No results for input(s): "LIPASE", "AMYLASE" in the last 168 hours. No results for  input(s): "AMMONIA" in the last 168 hours. CBC: Recent Labs  Lab 07/17/23 2145 07/18/23 0227 07/19/23 1010 07/20/23 0500 07/21/23 0249  WBC 7.8 7.0 5.6 5.6 6.8  NEUTROABS  --   --  4.3 3.0 3.7  HGB 11.6* 10.2* 9.7* 9.4* 10.2*  HCT 35.5* 30.7* 28.7* 27.6* 30.9*  MCV 88.3 86.2 85.7 85.7 87.3  PLT 266 259 194 199 228   Cardiac Enzymes: Recent Labs  Lab 07/17/23 2135  CKTOTAL 437*   BNP: BNP (last 3 results) Recent Labs    04/17/23 1217  BNP 765.1*    ProBNP (last 3 results) No results for input(s): "PROBNP" in the last 8760 hours.  CBG: Recent Labs  Lab 07/21/23 0631 07/21/23 1128 07/21/23 1538 07/21/23 2149 07/22/23 0602  GLUCAP 134* 147* 239* 141* 135*       Signed:  Raymonde Calico MD.  [facility] 07/22/2023, 10:58 AM

## 2023-07-22 NOTE — Progress Notes (Signed)
 Report given to Nurse Tammy from [facility], of discharge orders.

## 2023-07-22 NOTE — Progress Notes (Signed)
 DISCHARGE NOTE SNF [facility] to be discharged Skilled nursing facility [facility] care per MD order. Patient verbalized understanding.  Skin clean, dry and intact without evidence of skin break down, no evidence of skin tears noted. IV catheter discontinued intact. Site without signs and symptoms of complications. Dressing and pressure applied. Pt denies pain at the site currently. No complaints noted.  Patient discharging with PICC line and see other injuries and wounds on LDA  Discharge packet assembled. An After Visit Summary (AVS) was printed and given to the EMS personnel. Patient escorted via stretcher and discharged to [facility] via ambulance. Report called to accepting facility; all questions and concerns addressed.   Tonda Francisco, RN

## 2023-07-22 NOTE — TOC Transition Note (Signed)
 Transition of Care [facility]) - Discharge Note   Patient Details  Name: Harold [name] MRN: [id number] Date of Birth: [birth year]  Transition of Care [facility] CM/SW Contact:  Arron Big, LCSWA Phone Number: 07/22/2023, 11:20 AM   Clinical Narrative:  Patient will DC to: [facility] Anticipated DC date: 07/22/23  Family notified: [name] (spouse) Transport by: Lyna Sandhoff   Per MD patient ready for DC to [facility] . RN to call report prior to discharge [phone]; room 126b ). RN, patient, patient's family, and facility notified of DC. Discharge Summary and FL2 sent to facility. DC packet on chart. Ambulance transport requested for patient 11:17AM.    CSW will sign off for now as social work intervention is no longer needed. Please consult us  again if new needs arise.      Final next level of care: Skilled Nursing Facility Barriers to Discharge: Barriers Resolved   Patient Goals and CMS Choice Patient states their goals for this hospitalization and ongoing recovery are:: Return to SNF          Discharge Placement              Patient chooses bed at: [facility] Patient to be transferred to facility by: [facility] Name of family member notified: [name] (spouse) Patient and family notified of of transfer: 07/22/23  Discharge Plan and Services Additional resources added to the After Visit Summary for   In-house Referral: Clinical Social Work                                   Social Drivers of Health (SDOH) Interventions SDOH Screenings   Food Insecurity: No Food Insecurity (07/17/2023)  Housing: Low Risk  (07/17/2023)  Transportation Needs: No Transportation Needs (07/17/2023)  Utilities: Not At Risk (07/17/2023)  Depression (PHQ2-9): Low Risk  (07/17/2023)  Social Connections: Moderately Isolated (07/17/2023)  Tobacco Use: Medium Risk (07/17/2023)     Readmission Risk Interventions     No data to display

## 2023-07-23 DIAGNOSIS — M86171 Other acute osteomyelitis, right ankle and foot: Secondary | ICD-10-CM | POA: Diagnosis not present

## 2023-07-23 DIAGNOSIS — D631 Anemia in chronic kidney disease: Secondary | ICD-10-CM | POA: Diagnosis not present

## 2023-07-23 DIAGNOSIS — I872 Venous insufficiency (chronic) (peripheral): Secondary | ICD-10-CM | POA: Diagnosis not present

## 2023-07-23 DIAGNOSIS — D649 Anemia, unspecified: Secondary | ICD-10-CM | POA: Diagnosis not present

## 2023-07-23 DIAGNOSIS — I129 Hypertensive chronic kidney disease with stage 1 through stage 4 chronic kidney disease, or unspecified chronic kidney disease: Secondary | ICD-10-CM | POA: Diagnosis not present

## 2023-07-23 DIAGNOSIS — Z7409 Other reduced mobility: Secondary | ICD-10-CM | POA: Diagnosis not present

## 2023-07-23 DIAGNOSIS — L84 Corns and callosities: Secondary | ICD-10-CM | POA: Diagnosis not present

## 2023-07-23 DIAGNOSIS — N1832 Chronic kidney disease, stage 3b: Secondary | ICD-10-CM | POA: Diagnosis not present

## 2023-07-23 DIAGNOSIS — T847XXA Infection and inflammatory reaction due to other internal orthopedic prosthetic devices, implants and grafts, initial encounter: Secondary | ICD-10-CM | POA: Diagnosis not present

## 2023-07-23 DIAGNOSIS — E11621 Type 2 diabetes mellitus with foot ulcer: Secondary | ICD-10-CM | POA: Diagnosis not present

## 2023-07-23 DIAGNOSIS — J449 Chronic obstructive pulmonary disease, unspecified: Secondary | ICD-10-CM | POA: Diagnosis not present

## 2023-07-23 DIAGNOSIS — I951 Orthostatic hypotension: Secondary | ICD-10-CM | POA: Diagnosis not present

## 2023-07-23 DIAGNOSIS — L97509 Non-pressure chronic ulcer of other part of unspecified foot with unspecified severity: Secondary | ICD-10-CM | POA: Diagnosis not present

## 2023-07-23 DIAGNOSIS — R531 Weakness: Secondary | ICD-10-CM | POA: Diagnosis not present

## 2023-07-23 DIAGNOSIS — N1831 Chronic kidney disease, stage 3a: Secondary | ICD-10-CM | POA: Diagnosis not present

## 2023-07-23 DIAGNOSIS — G909 Disorder of the autonomic nervous system, unspecified: Secondary | ICD-10-CM | POA: Diagnosis not present

## 2023-07-23 DIAGNOSIS — Z794 Long term (current) use of insulin: Secondary | ICD-10-CM | POA: Diagnosis not present

## 2023-07-23 DIAGNOSIS — L603 Nail dystrophy: Secondary | ICD-10-CM | POA: Diagnosis not present

## 2023-07-23 DIAGNOSIS — N179 Acute kidney failure, unspecified: Secondary | ICD-10-CM | POA: Diagnosis not present

## 2023-07-23 DIAGNOSIS — E1151 Type 2 diabetes mellitus with diabetic peripheral angiopathy without gangrene: Secondary | ICD-10-CM | POA: Diagnosis not present

## 2023-07-23 DIAGNOSIS — E1122 Type 2 diabetes mellitus with diabetic chronic kidney disease: Secondary | ICD-10-CM | POA: Diagnosis not present

## 2023-07-23 DIAGNOSIS — L602 Onychogryphosis: Secondary | ICD-10-CM | POA: Diagnosis not present

## 2023-07-24 DIAGNOSIS — E1140 Type 2 diabetes mellitus with diabetic neuropathy, unspecified: Secondary | ICD-10-CM | POA: Diagnosis not present

## 2023-07-24 DIAGNOSIS — Z7409 Other reduced mobility: Secondary | ICD-10-CM | POA: Diagnosis not present

## 2023-07-24 DIAGNOSIS — I2510 Atherosclerotic heart disease of native coronary artery without angina pectoris: Secondary | ICD-10-CM | POA: Diagnosis not present

## 2023-07-24 DIAGNOSIS — G4733 Obstructive sleep apnea (adult) (pediatric): Secondary | ICD-10-CM | POA: Diagnosis not present

## 2023-07-24 DIAGNOSIS — Z0000 Encounter for general adult medical examination without abnormal findings: Secondary | ICD-10-CM | POA: Diagnosis not present

## 2023-07-24 DIAGNOSIS — I872 Venous insufficiency (chronic) (peripheral): Secondary | ICD-10-CM | POA: Diagnosis not present

## 2023-07-24 DIAGNOSIS — Z4889 Encounter for other specified surgical aftercare: Secondary | ICD-10-CM | POA: Diagnosis not present

## 2023-07-24 DIAGNOSIS — L97509 Non-pressure chronic ulcer of other part of unspecified foot with unspecified severity: Secondary | ICD-10-CM | POA: Diagnosis not present

## 2023-07-24 DIAGNOSIS — R262 Difficulty in walking, not elsewhere classified: Secondary | ICD-10-CM | POA: Diagnosis not present

## 2023-07-24 DIAGNOSIS — M86171 Other acute osteomyelitis, right ankle and foot: Secondary | ICD-10-CM | POA: Diagnosis not present

## 2023-07-24 DIAGNOSIS — T8140XD Infection following a procedure, unspecified, subsequent encounter: Secondary | ICD-10-CM | POA: Diagnosis not present

## 2023-07-24 DIAGNOSIS — T847XXA Infection and inflammatory reaction due to other internal orthopedic prosthetic devices, implants and grafts, initial encounter: Secondary | ICD-10-CM | POA: Diagnosis not present

## 2023-07-24 DIAGNOSIS — I5032 Chronic diastolic (congestive) heart failure: Secondary | ICD-10-CM | POA: Diagnosis not present

## 2023-07-24 DIAGNOSIS — L97314 Non-pressure chronic ulcer of right ankle with necrosis of bone: Secondary | ICD-10-CM | POA: Diagnosis not present

## 2023-07-24 DIAGNOSIS — I951 Orthostatic hypotension: Secondary | ICD-10-CM | POA: Diagnosis not present

## 2023-07-24 DIAGNOSIS — R531 Weakness: Secondary | ICD-10-CM | POA: Diagnosis not present

## 2023-07-24 DIAGNOSIS — Z792 Long term (current) use of antibiotics: Secondary | ICD-10-CM | POA: Diagnosis not present

## 2023-07-24 DIAGNOSIS — E785 Hyperlipidemia, unspecified: Secondary | ICD-10-CM | POA: Diagnosis not present

## 2023-07-26 DIAGNOSIS — Z792 Long term (current) use of antibiotics: Secondary | ICD-10-CM | POA: Diagnosis not present

## 2023-07-26 DIAGNOSIS — R531 Weakness: Secondary | ICD-10-CM | POA: Diagnosis not present

## 2023-07-26 DIAGNOSIS — L97509 Non-pressure chronic ulcer of other part of unspecified foot with unspecified severity: Secondary | ICD-10-CM | POA: Diagnosis not present

## 2023-07-26 DIAGNOSIS — Z7409 Other reduced mobility: Secondary | ICD-10-CM | POA: Diagnosis not present

## 2023-07-26 DIAGNOSIS — I951 Orthostatic hypotension: Secondary | ICD-10-CM | POA: Diagnosis not present

## 2023-07-26 DIAGNOSIS — T847XXA Infection and inflammatory reaction due to other internal orthopedic prosthetic devices, implants and grafts, initial encounter: Secondary | ICD-10-CM | POA: Diagnosis not present

## 2023-07-28 ENCOUNTER — Ambulatory Visit: Admitting: Infectious Diseases

## 2023-07-28 ENCOUNTER — Other Ambulatory Visit: Payer: Self-pay

## 2023-07-28 ENCOUNTER — Telehealth: Payer: Self-pay

## 2023-07-28 ENCOUNTER — Other Ambulatory Visit (HOSPITAL_COMMUNITY): Payer: Self-pay

## 2023-07-28 DIAGNOSIS — E11621 Type 2 diabetes mellitus with foot ulcer: Secondary | ICD-10-CM | POA: Diagnosis not present

## 2023-07-28 DIAGNOSIS — I951 Orthostatic hypotension: Secondary | ICD-10-CM | POA: Diagnosis not present

## 2023-07-28 DIAGNOSIS — Z7409 Other reduced mobility: Secondary | ICD-10-CM | POA: Diagnosis not present

## 2023-07-28 DIAGNOSIS — I872 Venous insufficiency (chronic) (peripheral): Secondary | ICD-10-CM | POA: Diagnosis not present

## 2023-07-28 DIAGNOSIS — L97509 Non-pressure chronic ulcer of other part of unspecified foot with unspecified severity: Secondary | ICD-10-CM | POA: Diagnosis not present

## 2023-07-28 DIAGNOSIS — R112 Nausea with vomiting, unspecified: Secondary | ICD-10-CM | POA: Diagnosis not present

## 2023-07-28 DIAGNOSIS — R531 Weakness: Secondary | ICD-10-CM | POA: Diagnosis not present

## 2023-07-28 NOTE — Telephone Encounter (Signed)
 Called [facility] to reschedule missed appointment. Spoke with nurse Winda Hastings who stated that the patients bp had drop and their doctor said it was unsafe for patient to travel.  Rescheduled  appointment for tomorrow with Dr. Shereen Dike at 8:45 and they will call back if patient is unable to travel.

## 2023-07-29 ENCOUNTER — Encounter: Payer: Self-pay | Admitting: Pharmacist

## 2023-07-29 ENCOUNTER — Encounter: Payer: Self-pay | Admitting: Internal Medicine

## 2023-07-29 ENCOUNTER — Other Ambulatory Visit: Payer: Self-pay

## 2023-07-29 ENCOUNTER — Telehealth: Payer: Self-pay

## 2023-07-29 ENCOUNTER — Ambulatory Visit (INDEPENDENT_AMBULATORY_CARE_PROVIDER_SITE_OTHER): Admitting: Internal Medicine

## 2023-07-29 ENCOUNTER — Inpatient Hospital Stay (HOSPITAL_COMMUNITY)
Admission: EM | Admit: 2023-07-29 | Discharge: 2023-08-08 | DRG: 315 | Disposition: A | Discharge status: Skilled Nursing Facility | Source: Skilled Nursing Facility | Attending: Internal Medicine | Admitting: Internal Medicine

## 2023-07-29 VITALS — BP 92/63 | HR 76 | Temp 98.7°F | Resp 16 | Ht 73.0 in

## 2023-07-29 DIAGNOSIS — J984 Other disorders of lung: Secondary | ICD-10-CM

## 2023-07-29 DIAGNOSIS — G629 Polyneuropathy, unspecified: Secondary | ICD-10-CM

## 2023-07-29 DIAGNOSIS — Z981 Arthrodesis status: Secondary | ICD-10-CM

## 2023-07-29 DIAGNOSIS — N400 Enlarged prostate without lower urinary tract symptoms: Secondary | ICD-10-CM | POA: Diagnosis not present

## 2023-07-29 DIAGNOSIS — Z7409 Other reduced mobility: Secondary | ICD-10-CM | POA: Diagnosis not present

## 2023-07-29 DIAGNOSIS — D631 Anemia in chronic kidney disease: Secondary | ICD-10-CM | POA: Diagnosis present

## 2023-07-29 DIAGNOSIS — R7881 Bacteremia: Secondary | ICD-10-CM | POA: Diagnosis not present

## 2023-07-29 DIAGNOSIS — E876 Hypokalemia: Secondary | ICD-10-CM | POA: Diagnosis not present

## 2023-07-29 DIAGNOSIS — Z87891 Personal history of nicotine dependence: Secondary | ICD-10-CM

## 2023-07-29 DIAGNOSIS — Z7902 Long term (current) use of antithrombotics/antiplatelets: Secondary | ICD-10-CM

## 2023-07-29 DIAGNOSIS — I4819 Other persistent atrial fibrillation: Secondary | ICD-10-CM | POA: Diagnosis present

## 2023-07-29 DIAGNOSIS — I9589 Other hypotension: Secondary | ICD-10-CM | POA: Diagnosis present

## 2023-07-29 DIAGNOSIS — Z7901 Long term (current) use of anticoagulants: Secondary | ICD-10-CM

## 2023-07-29 DIAGNOSIS — R6889 Other general symptoms and signs: Secondary | ICD-10-CM | POA: Diagnosis not present

## 2023-07-29 DIAGNOSIS — I272 Pulmonary hypertension, unspecified: Secondary | ICD-10-CM | POA: Diagnosis not present

## 2023-07-29 DIAGNOSIS — D649 Anemia, unspecified: Secondary | ICD-10-CM | POA: Diagnosis not present

## 2023-07-29 DIAGNOSIS — E669 Obesity, unspecified: Secondary | ICD-10-CM | POA: Diagnosis present

## 2023-07-29 DIAGNOSIS — Z794 Long term (current) use of insulin: Secondary | ICD-10-CM | POA: Diagnosis not present

## 2023-07-29 DIAGNOSIS — E1151 Type 2 diabetes mellitus with diabetic peripheral angiopathy without gangrene: Secondary | ICD-10-CM | POA: Diagnosis not present

## 2023-07-29 DIAGNOSIS — I739 Peripheral vascular disease, unspecified: Secondary | ICD-10-CM | POA: Diagnosis not present

## 2023-07-29 DIAGNOSIS — Z951 Presence of aortocoronary bypass graft: Secondary | ICD-10-CM | POA: Diagnosis not present

## 2023-07-29 DIAGNOSIS — F411 Generalized anxiety disorder: Secondary | ICD-10-CM | POA: Diagnosis present

## 2023-07-29 DIAGNOSIS — G4733 Obstructive sleep apnea (adult) (pediatric): Secondary | ICD-10-CM | POA: Diagnosis present

## 2023-07-29 DIAGNOSIS — E1122 Type 2 diabetes mellitus with diabetic chronic kidney disease: Secondary | ICD-10-CM | POA: Diagnosis present

## 2023-07-29 DIAGNOSIS — E039 Hypothyroidism, unspecified: Secondary | ICD-10-CM | POA: Diagnosis present

## 2023-07-29 DIAGNOSIS — I129 Hypertensive chronic kidney disease with stage 1 through stage 4 chronic kidney disease, or unspecified chronic kidney disease: Secondary | ICD-10-CM | POA: Diagnosis present

## 2023-07-29 DIAGNOSIS — B9689 Other specified bacterial agents as the cause of diseases classified elsewhere: Secondary | ICD-10-CM | POA: Diagnosis present

## 2023-07-29 DIAGNOSIS — Z8709 Personal history of other diseases of the respiratory system: Secondary | ICD-10-CM

## 2023-07-29 DIAGNOSIS — N179 Acute kidney failure, unspecified: Secondary | ICD-10-CM | POA: Diagnosis not present

## 2023-07-29 DIAGNOSIS — R7989 Other specified abnormal findings of blood chemistry: Secondary | ICD-10-CM | POA: Diagnosis present

## 2023-07-29 DIAGNOSIS — I10 Essential (primary) hypertension: Secondary | ICD-10-CM | POA: Diagnosis not present

## 2023-07-29 DIAGNOSIS — M869 Osteomyelitis, unspecified: Secondary | ICD-10-CM | POA: Diagnosis not present

## 2023-07-29 DIAGNOSIS — Z955 Presence of coronary angioplasty implant and graft: Secondary | ICD-10-CM

## 2023-07-29 DIAGNOSIS — N170 Acute kidney failure with tubular necrosis: Secondary | ICD-10-CM | POA: Diagnosis not present

## 2023-07-29 DIAGNOSIS — E785 Hyperlipidemia, unspecified: Secondary | ICD-10-CM | POA: Diagnosis not present

## 2023-07-29 DIAGNOSIS — I4581 Long QT syndrome: Secondary | ICD-10-CM | POA: Diagnosis not present

## 2023-07-29 DIAGNOSIS — I480 Paroxysmal atrial fibrillation: Secondary | ICD-10-CM | POA: Diagnosis not present

## 2023-07-29 DIAGNOSIS — E1140 Type 2 diabetes mellitus with diabetic neuropathy, unspecified: Secondary | ICD-10-CM | POA: Diagnosis not present

## 2023-07-29 DIAGNOSIS — Z751 Person awaiting admission to adequate facility elsewhere: Secondary | ICD-10-CM

## 2023-07-29 DIAGNOSIS — Z882 Allergy status to sulfonamides status: Secondary | ICD-10-CM

## 2023-07-29 DIAGNOSIS — J449 Chronic obstructive pulmonary disease, unspecified: Secondary | ICD-10-CM | POA: Diagnosis present

## 2023-07-29 DIAGNOSIS — N1832 Chronic kidney disease, stage 3b: Secondary | ICD-10-CM | POA: Diagnosis present

## 2023-07-29 DIAGNOSIS — R531 Weakness: Secondary | ICD-10-CM | POA: Diagnosis not present

## 2023-07-29 DIAGNOSIS — R9431 Abnormal electrocardiogram [ECG] [EKG]: Secondary | ICD-10-CM | POA: Diagnosis not present

## 2023-07-29 DIAGNOSIS — T847XXA Infection and inflammatory reaction due to other internal orthopedic prosthetic devices, implants and grafts, initial encounter: Secondary | ICD-10-CM | POA: Diagnosis present

## 2023-07-29 DIAGNOSIS — Z888 Allergy status to other drugs, medicaments and biological substances status: Secondary | ICD-10-CM

## 2023-07-29 DIAGNOSIS — E119 Type 2 diabetes mellitus without complications: Secondary | ICD-10-CM | POA: Diagnosis not present

## 2023-07-29 DIAGNOSIS — M86171 Other acute osteomyelitis, right ankle and foot: Secondary | ICD-10-CM | POA: Diagnosis not present

## 2023-07-29 DIAGNOSIS — Z79899 Other long term (current) drug therapy: Secondary | ICD-10-CM

## 2023-07-29 DIAGNOSIS — F32A Depression, unspecified: Secondary | ICD-10-CM | POA: Diagnosis present

## 2023-07-29 DIAGNOSIS — Z7985 Long-term (current) use of injectable non-insulin antidiabetic drugs: Secondary | ICD-10-CM

## 2023-07-29 DIAGNOSIS — E639 Nutritional deficiency, unspecified: Secondary | ICD-10-CM | POA: Diagnosis not present

## 2023-07-29 DIAGNOSIS — Z6829 Body mass index (BMI) 29.0-29.9, adult: Secondary | ICD-10-CM

## 2023-07-29 DIAGNOSIS — E860 Dehydration: Secondary | ICD-10-CM | POA: Diagnosis not present

## 2023-07-29 DIAGNOSIS — Z7952 Long term (current) use of systemic steroids: Secondary | ICD-10-CM

## 2023-07-29 DIAGNOSIS — Z79890 Hormone replacement therapy (postmenopausal): Secondary | ICD-10-CM

## 2023-07-29 DIAGNOSIS — I951 Orthostatic hypotension: Secondary | ICD-10-CM | POA: Diagnosis not present

## 2023-07-29 DIAGNOSIS — I2510 Atherosclerotic heart disease of native coronary artery without angina pectoris: Secondary | ICD-10-CM | POA: Diagnosis not present

## 2023-07-29 DIAGNOSIS — E038 Other specified hypothyroidism: Secondary | ICD-10-CM | POA: Diagnosis not present

## 2023-07-29 DIAGNOSIS — F331 Major depressive disorder, recurrent, moderate: Secondary | ICD-10-CM | POA: Diagnosis present

## 2023-07-29 DIAGNOSIS — Z7984 Long term (current) use of oral hypoglycemic drugs: Secondary | ICD-10-CM

## 2023-07-29 DIAGNOSIS — Z8679 Personal history of other diseases of the circulatory system: Secondary | ICD-10-CM

## 2023-07-29 LAB — CBC WITH DIFFERENTIAL/PLATELET
Abs Immature Granulocytes: 0.03 10*3/uL (ref 0.00–0.07)
Basophils Absolute: 0.1 10*3/uL (ref 0.0–0.1)
Basophils Relative: 1 %
Eosinophils Absolute: 0.2 10*3/uL (ref 0.0–0.5)
Eosinophils Relative: 2 %
HCT: 29.7 % — ABNORMAL LOW (ref 39.0–52.0)
Hemoglobin: 10.2 g/dL — ABNORMAL LOW (ref 13.0–17.0)
Immature Granulocytes: 0 %
Lymphocytes Relative: 28 %
Lymphs Abs: 1.9 10*3/uL (ref 0.7–4.0)
MCH: 29.9 pg (ref 26.0–34.0)
MCHC: 34.3 g/dL (ref 30.0–36.0)
MCV: 87.1 fL (ref 80.0–100.0)
Monocytes Absolute: 0.6 10*3/uL (ref 0.1–1.0)
Monocytes Relative: 9 %
Neutro Abs: 3.9 10*3/uL (ref 1.7–7.7)
Neutrophils Relative %: 60 %
Platelets: 245 10*3/uL (ref 150–400)
RBC: 3.41 MIL/uL — ABNORMAL LOW (ref 4.22–5.81)
RDW: 16.4 % — ABNORMAL HIGH (ref 11.5–15.5)
WBC: 6.7 10*3/uL (ref 4.0–10.5)
nRBC: 0 % (ref 0.0–0.2)

## 2023-07-29 LAB — COMPREHENSIVE METABOLIC PANEL WITH GFR
ALT: 26 U/L (ref 0–44)
AST: 39 U/L (ref 15–41)
Albumin: 1.9 g/dL — ABNORMAL LOW (ref 3.5–5.0)
Alkaline Phosphatase: 118 U/L (ref 38–126)
Anion gap: 10 (ref 5–15)
BUN: 22 mg/dL (ref 8–23)
CO2: 21 mmol/L — ABNORMAL LOW (ref 22–32)
Calcium: 7.9 mg/dL — ABNORMAL LOW (ref 8.9–10.3)
Chloride: 108 mmol/L (ref 98–111)
Creatinine, Ser: 2.34 mg/dL — ABNORMAL HIGH (ref 0.61–1.24)
GFR, Estimated: 29 mL/min — ABNORMAL LOW (ref >60)
Glucose, Bld: 94 mg/dL (ref 70–99)
Potassium: 3.3 mmol/L — ABNORMAL LOW (ref 3.5–5.1)
Sodium: 139 mmol/L (ref 135–145)
Total Bilirubin: 0.8 mg/dL (ref 0.0–1.2)
Total Protein: 5.1 g/dL — ABNORMAL LOW (ref 6.5–8.1)

## 2023-07-29 LAB — TYPE AND SCREEN
ABO/RH(D): A POS
Antibody Screen: NEGATIVE

## 2023-07-29 MED ORDER — CALCIUM GLUCONATE-NACL 1-0.675 GM/50ML-% IV SOLN
1.0000 g | Freq: Once | INTRAVENOUS | Status: AC
  Administered 2023-07-29: 1000 mg via INTRAVENOUS
  Filled 2023-07-29: qty 50

## 2023-07-29 MED ORDER — MAGNESIUM SULFATE 2 GM/50ML IV SOLN
2.0000 g | Freq: Once | INTRAVENOUS | Status: AC
  Administered 2023-07-29: 2 g via INTRAVENOUS
  Filled 2023-07-29: qty 50

## 2023-07-29 MED ORDER — POTASSIUM CHLORIDE CRYS ER 20 MEQ PO TBCR: 40.0000 meq | EXTENDED_RELEASE_TABLET | Freq: Once | ORAL | Status: DC

## 2023-07-29 MED ORDER — POTASSIUM CHLORIDE CRYS ER 20 MEQ PO TBCR
20.0000 meq | EXTENDED_RELEASE_TABLET | Freq: Once | ORAL | Status: AC
  Administered 2023-07-29: 20 meq via ORAL
  Filled 2023-07-29: qty 1

## 2023-07-29 NOTE — Patient Instructions (Addendum)
 We'll have your nursing home remove picc on 6/12  Please see us  again end of June   We'll follow you out for another 3 months to determine if infection is gone    thanks

## 2023-07-29 NOTE — ED Provider Notes (Signed)
 Care of patient received from prior provider at 11:09 PM, please see their note for complete H/P and care plan.  Received handoff per ED course.  Clinical Course as of 07/30/23 0139  Tue Jul 29, 2023  2107 Hemoglobin(!): 10.2 Stable Hgb from 10 days ago. Value of 5.2 that was reported earlier was likely erroneous. [HN]  2108 Creatinine(!): 2.34 Very mild elevation from BL 1.6-2.2 [HN]  2112 Calcium (!): 7.9 +hypocalcemia with prolonged QT on EKG. Will replete with 1g calcium  gluconate.  [HN]  2113 Potassium(!): 3.3 Mild hypokalemia, will replete [HN]  2300 Stable 67YOM with a ccx of lab abnormality  HGB 5  QTC 600 Hypocalcemia Recently started on midodrine . [CC]  2343 Discussed with Cardiology. Hold QTC prolonging meds.  [CC]  Wed Jul 30, 2023  0040 Medication changes discussed to  [CC]    Clinical Course User Index [CC] Onetha Bile, MD [HN] Merdis Stalling, MD   CRITICAL CARE Performed by: Onetha Bile   Total critical care time: 30 minutes for cardiac emergency requiring IV mag and specialist consulation  Critical care time was exclusive of separately billable procedures and treating other patients.  Critical care was necessary to treat or prevent imminent or life-threatening deterioration.  Critical care was time spent personally by me on the following activities: development of treatment plan with patient and/or surrogate as well as nursing, discussions with consultants, evaluation of patient's response to treatment, examination of patient, obtaining history from patient or surrogate, ordering and performing treatments and interventions, ordering and review of laboratory studies, ordering and review of radiographic studies, pulse oximetry and re-evaluation of patient's condition.  Disposition:   Based on the above findings, I believe this patient is stable for admission.    Patient/family educated about specific findings on our evaluation and explained exact  reasons for admission.  Patient/family educated about clinical situation and time was allowed to answer questions.   Admission team communicated with and agreed with need for admission. Patient admitted. Patient  ready to move at this time.     Emergency Department Medication Summary:   Medications  lactated ringers  infusion ( Intravenous New Bag/Given 07/30/23 0120)  allopurinol  (ZYLOPRIM ) tablet 50 mg (has no administration in time range)  atorvastatin  (LIPITOR) tablet 40 mg (has no administration in time range)  carvedilol  (COREG ) tablet 3.125 mg (has no administration in time range)  ezetimibe  (ZETIA ) tablet 10 mg (has no administration in time range)  levothyroxine  (SYNTHROID ) tablet 88 mcg (has no administration in time range)  pantoprazole  (PROTONIX ) EC tablet 40 mg (has no administration in time range)  finasteride (PROSCAR) tablet 5 mg (has no administration in time range)  apixaban  (ELIQUIS ) tablet 5 mg (has no administration in time range)  clopidogrel  (PLAVIX ) tablet 75 mg (has no administration in time range)  ferrous sulfate  tablet 325 mg (has no administration in time range)  gabapentin  (NEURONTIN ) capsule 300 mg (has no administration in time range)  methocarbamol  (ROBAXIN ) tablet 500 mg (has no administration in time range)  Gerhardt's butt cream 1 Application (has no administration in time range)  sodium chloride  flush (NS) 0.9 % injection 3 mL (has no administration in time range)  sodium chloride  flush (NS) 0.9 % injection 3 mL (has no administration in time range)  sodium chloride  flush (NS) 0.9 % injection 3 mL (has no administration in time range)  0.9 %  sodium chloride  infusion (has no administration in time range)  acetaminophen  (TYLENOL ) tablet 650 mg (has no administration in time range)  Or  acetaminophen  (TYLENOL ) suppository 650 mg (has no administration in time range)  trimethobenzamide  (TIGAN ) injection 200 mg (has no administration in time range)   senna-docusate (Senokot-S) tablet 1 tablet (has no administration in time range)  hydrALAZINE  (APRESOLINE ) injection 5 mg (5 mg Intravenous Given 07/30/23 0120)  ipratropium (ATROVENT ) nebulizer solution 0.5 mg (has no administration in time range)  calcium  gluconate 1 g/ 50 mL sodium chloride  IVPB (0 mg Intravenous Stopped 07/29/23 2224)  potassium chloride  SA (KLOR-CON  M) CR tablet 20 mEq (20 mEq Oral Given 07/29/23 2122)  magnesium  sulfate IVPB 2 g 50 mL (0 g Intravenous Stopped 07/29/23 2329)            Onetha Bile, MD 07/30/23 0140

## 2023-07-29 NOTE — Telephone Encounter (Signed)
 Requested labs be faxed as patient reports they did labs this AM at [facility]- Have not rcvd previous labs requested.

## 2023-07-29 NOTE — ED Triage Notes (Signed)
 Patient BIB [facility] from [facility] w/ routine labwork that showed a hemoglobin of 5.2. At this time patient has no complaints. Denies bleeding from anywhere that he knows of. Denies shortness of breath, chest pain. Is on eliquis  and plavix . Has PICC line in R upper arm for antibiotic treatment from an ankle fx back in February that got infected.

## 2023-07-29 NOTE — Telephone Encounter (Signed)
 Called SNF [facility] and advised EOT IV Abx 07/31/2023. PULL PICC after last dose on 07/31/23. Verbal orders explained to Figani @ [facility]. Follow up scheduled and sent with orders. Attached [facility] Team for OPAT Tracking.

## 2023-07-29 NOTE — Progress Notes (Signed)
 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, right (HCC) 03/28/2023   Abscess of skin of right ankle 03/28/2023   PAD (peripheral artery disease) (HCC) 03/28/2023   Vitamin D  deficiency 01/07/2023   Prolonged QT interval 01/02/2023   Acute kidney injury superimposed on stage 4 chronic kidney disease (HCC) 01/02/2023   Normocytic anemia 01/02/2023   Paroxysmal atrial fibrillation (HCC) 01/02/2023   Uncontrolled type 2 diabetes mellitus with hyperglycemia, with long-term current use of insulin  (HCC) 01/02/2023   Acquired hypothyroidism 01/02/2023   CKD stage 3b, GFR 30-44 ml/min (HCC) 11/07/2022   CAD in native artery 11/07/2022   PVD (peripheral vascular disease) (HCC) 11/06/2022   Hypercoagulable state due to persistent atrial fibrillation (HCC) 02/19/2022   Persistent atrial fibrillation (HCC) 01/28/2022   Lumbar stenosis with neurogenic claudication 07/26/2020   Diabetic neuropathy (HCC) 08/24/2019   Trigger thumb of left hand 09/02/2018   Atherosclerosis of native artery of both lower extremities with intermittent claudication (HCC) 05/12/2018   Pain of left hand 03/26/2018   Trigger finger 03/03/2017   Pain in finger of left hand 03/03/2017   Snoring 08/20/2016   Morbid (severe) obesity due to excess calories (HCC) 04/18/2016   Venous stasis of both lower extremities - with edema  02/25/2015   Right-sided chest wall pain 05/08/2014   Gastroesophageal reflux disease without esophagitis 10/10/2013   Chronic heart failure with preserved ejection fraction (HFpEF) (HCC) 06/27/2013   COPD (chronic obstructive pulmonary disease) (HCC) 06/17/2013   Restrictive lung disease 06/17/2013   Obesity, Class III, BMI 40-49.9 (morbid obesity) 09/01/2012   Generalized weakness 09/01/2012   Chronic renal disease, stage IV (HCC) 04/30/2012   OSA on CPAP 04/29/2012   Pulmonary hypertension, 04/29/2012   Dyspnea on exertion   04/26/2012   CAD, LAD/CFX DES 04/28/12- staged RCA DES 04/29/12 after pt declined CABG, cath 05/14/13 stable CAD medical therapy    Carotid artery stenosis without cerebral infarction, bilateral 11/07/2011   Diabetes mellitus type 2 with neurological manifestations (HCC) 04/29/2011   Essential hypertension 04/29/2011   Neuropathy 04/29/2011   Hyperlipidemia associated with type 2 diabetes mellitus (HCC) 04/29/2011   Anxiety and depression 04/29/2011    Patient's Medications  New Prescriptions   No medications on file  Previous Medications   ACETAMINOPHEN  (TYLENOL ) 500 MG TABLET    Take 1 tablet (500 mg total) by mouth every 6 (six) hours as needed for mild pain (pain score 1-3).   ALLOPURINOL  (ZYLOPRIM ) 100 MG TABLET    Take 0.5 tablets (50 mg total) by mouth daily.   AMINO ACIDS-PROTEIN HYDROLYS (PRO-STAT PO)    Take 30 mLs by mouth in the morning and at bedtime.   AMIODARONE  (PACERONE ) 200 MG TABLET    Take 1 tablet (200 mg total) by mouth 2 (two) times daily.   APIXABAN  (ELIQUIS ) 5 MG TABS TABLET    TAKE 1 TABLET BY MOUTH TWICE A DAY   ATORVASTATIN  (LIPITOR)  40 MG TABLET    Take 1 tablet (40 mg total) by mouth daily.   CARVEDILOL  (COREG ) 3.125 MG TABLET    Take 3.125 mg by mouth 2 (two) times daily.   CLOPIDOGREL  (PLAVIX ) 75 MG TABLET    Take 75 mg by mouth every morning.   CONTINUOUS GLUCOSE SENSOR (FREESTYLE LIBRE 3 PLUS SENSOR) MISC    Inject 1  Device into the skin every 14 (fourteen) days.   DAPTOMYCIN  (CUBICIN ) IVPB    Inject 800 mg into the vein daily. Indication:  Hardware associated ankle osteomyelitis  First Dose: Yes Last Day of Therapy:  08/02/23 Labs - Once weekly:  CBC/D, BMP, and CPK Labs - Once weekly: ESR and CRP   DOCUSATE SODIUM  (COLACE) 100 MG CAPSULE    Take 1 capsule (100 mg total) by mouth 2 (two) times daily.   DULOXETINE  (CYMBALTA ) 20 MG CAPSULE    Take 1 capsule (20 mg total) by mouth daily.   ERTAPENEM  (INVANZ ) IVPB    Inject 1 g into the vein daily. Indication:  Hardware associated ankle osteomyelitis  First Dose: Yes Last Day of Therapy:  08/02/23 Labs - Once weekly:  CBC/D and BMP, Labs - Once weekly: ESR and CRP   EZETIMIBE  (ZETIA ) 10 MG TABLET    Take 10 mg by mouth at bedtime.   FERROUS SULFATE  325 (65 FE) MG TABLET    Take 325 mg by mouth 3 (three) times a week. Monday, Wednesday, Friday   FINASTERIDE (PROSCAR) 5 MG TABLET    Take 5 mg by mouth daily.   FLUDROCORTISONE  (FLORINEF ) 0.1 MG TABLET    Take 1 tablet (0.1 mg total) by mouth daily.   FUROSEMIDE  (LASIX ) 40 MG TABLET    Take 40 mg by mouth daily.   GABAPENTIN  (NEURONTIN ) 300 MG CAPSULE    Take 1 capsule (300 mg total) by mouth at bedtime.   INSULIN  GLARGINE (LANTUS  SOLOSTAR) 100 UNIT/ML SOLOSTAR PEN    Inject 5 Units into the skin at bedtime.   INSULIN  LISPRO (HUMALOG) 100 UNIT/ML INJECTION    Inject 0-15 Units into the skin in the morning, at noon, in the evening, and at bedtime. Per sliding scale: 0-70= 0 units if <70 implement hypoglycemic protocol & notify MD/NP 71-150= 0 units 151-200=3 units 201-250= 6 units 251-300= 9 units 301-350= 12 units 351-400= 15 units 401-999 if CBG>400. Notify MD/ NP,   LEVOTHYROXINE  (SYNTHROID , LEVOTHROID) 88 MCG TABLET    Take 88 mcg by mouth daily before breakfast.   MAGNESIUM  OXIDE (MAG-OX) 400 MG TABLET    Take 1 tablet by mouth daily.   METHOCARBAMOL  (ROBAXIN ) 500 MG TABLET    Take 1 tablet (500 mg  total) by mouth every 6 (six) hours as needed for muscle spasms.   MIDODRINE  (PROAMATINE ) 10 MG TABLET    Take 1 tablet (10 mg total) by mouth 3 (three) times daily with meals. Take one hour prior to physical therapy   MULTIPLE VITAMIN (MULTIVITAMIN WITH MINERALS) TABS TABLET    Take 1 tablet by mouth daily.   NITROGLYCERIN  (NITROSTAT ) 0.4 MG SL TABLET    PLACE 1 TAB UNDER THE TONGUE EVERY 5 MINUTES AS NEEDED FOR CHEST PAIN (SEVERE PRESSURE OR TIGHTNESS)   NUTRITION SUPPLEMENT, JUVEN, (JUVEN) PACK    Take 1 packet by mouth 2 (two) times daily between meals.   NYSTATIN (GERHARDT'S BUTT CREAM) CREA    Apply 1 Application topically 2 (two) times daily.   PANTOPRAZOLE  (PROTONIX ) 40 MG TABLET  TAKE 1 TABLET BY MOUTH EVERY DAY   POLYETHYLENE GLYCOL (MIRALAX  / GLYCOLAX ) 17 G PACKET    Take 17 g by mouth daily as needed for mild constipation.   SERTRALINE  (ZOLOFT ) 100 MG TABLET    Take 1 tablet (100 mg total) by mouth daily.   TIRZEPATIDE  (MOUNJARO ) 15 MG/0.5ML PEN    Inject 15 mg into the skin once a week.   VITAMIN D , ERGOCALCIFEROL , (DRISDOL ) 1.25 MG (50000 UNIT) CAPS CAPSULE    Take 1 capsule (50,000 Units total) by mouth every 7 (seven) days.  Modified Medications   No medications on file  Discontinued Medications   No medications on file    Subjective: 68 Y O male with h/o CKD, qtc prolongation, morbid obesity, OSA, COPD, DM2 with neuropathy, CAD s/p CABG, HLD A fib on AC, Hypothyroidism, Pul HTN, CHF, GERD, Carotid artery stenosis, PVD, Venous stasis of lower extremities, prior Rt lower extremity ORIF associated infection s/p I and D on 03/31/23 and 04/02/23 with all hardware out ( cx MRSE and enterobacter claoacae)  placed on dapto/cefepime  but stopped on 2/28 due to AKI on CKD concerning for AIN requiring iHD discharged on 3/22 but re-admitted 3/24 after a ground level fall resulting in rt open ankle # through the area of previous infection s/p initial ex fix, closed reduction, I and D on 3/24  followed by Removal of external fixator,ORIF on 3/27 with course complicated by enterobacter bacteremia who is here for follow up. Patient had HDC remove don 3/30, Repeat blood cx 3/31 NG. No need for HD on discharge. Patient was discharged to home with [facility] with plan to complete 4 weeks of ciprofloxscin through 4/28 with close qtc monitoring    4/29 Unable to come to clinic in person and hence, visit switched to video. Husband reports ciprofloxacin  was completed 3 days ago. Sutures out yesterday. 2 sores developed in rt ankle  that developed a week ago, recently draining clear stuff. No fevers, chills, nausea, vomiting. Unable to be seen at Dr Curtiss Dowdy office   and had portable xray done at home,  no results yet. He is unable to walk and even difficult to transfer from bed to commode. BP goes down when standing as well as seizures which they were able to control but does not want to go to ED. Seen in the ED 4/7 for SOB while doing PT with unremarkable work up. Labs were drawn 2 hrs ago.  5/29 Patient was recently admitted 5/1-5/10 due to exposed hardware in rt ankle. He underwent  removal of rt ankle hardware by Dr Curtiss Dowdy on 06/21/23. Or cx not sent. Seen by ID and recommended 6 weeks of IV daptomycin  and ertapenem  via PICC.   Patient here from facility and is in a wheelchair. Staff took vitals on arrival with repeated BP in the range of 80s/50s. He also complained of dizziness during evaluation and reported passing out last week. He is getting IV antibiotics from rt arm PICC without concerns. Denies fevers, chills, nausea, vomiting or diarrhea.    07/29/23 id clinic f/u Recent ed visit a week ago for chronic hypotension -- awaiting cardiology evaluation Right foot doing well no pain; small dimple of an area that is scabbing but no drainage No f/c No rash, n/v/d No muscle pain No cough/dyspnea  Have abx until 6/14  Review of Systems: all systems reviewed with pertinent positives and negatives as  listed above   Past Medical History:  Diagnosis Date   Anemia  Anginal pain (HCC) 04/2013   Cardiac cath showed patent stents with distal LAD, circumflex-OM and RCA disease in small vessels.   Anxiety    Atrial fibrillation (HCC) 12/2021   CAD S/P percutaneous coronary angioplasty 2003, 04/2012   status post PCI to LAD, circumflex-OM 2, RCA   Carotid artery occlusion    Chronic renal insufficiency, stage II (mild)    Chronic venous insufficiency    varicosities, no reflux; dopplers 04/14/12- valvular insufficiency in the R and L GSV   Complication of anesthesia    COPD, mild (HCC)    Depression    situaltional    Diabetes mellitus    Diabetic neuropathy (HCC) 08/24/2019   Dyslipidemia associated with type 2 diabetes mellitus (HCC)    Fall at home, initial encounter 01/02/2023   GERD (gastroesophageal reflux disease)    History of cardioversion 12/2021   at [facility]   HTN (hypertension)    Hypothyroidism    Neuromuscular disorder (HCC)    neuropathy in feet   Neuropathy    notably improved following PCI with improved cardiac function   Obesity    Obesity, Class II, BMI 35.0-39.9, with comorbidity (see actual BMI)    BMI 39; wgt loss efforts in place; seeing Dietician   Open ankle fracture 01/02/2023   Orthostatic hypotension    PONV (postoperative nausea and vomiting)    "[employer]"   Pulmonary hypertension (HCC) 04/2012   PA pressure   Sleep apnea    uses nightly   Vitamin D  deficiency    per facility notes   Past Surgical History:  Procedure Laterality Date   ABDOMINAL AORTAGRAM N/A 11/15/2011   Procedure: ABDOMINAL Tommi Fraise;  Surgeon: Richrd Char, MD;  Location: [facility] CATH LAB;  Service: Cardiovascular;  [name];   ABDOMINAL AORTOGRAM W/LOWER EXTREMITY N/A 10/13/2019   Procedure: ABDOMINAL AORTOGRAM W/LOWER EXTREMITY;  Surgeon: Wenona Hamilton, MD;  Location: [facility] INVASIVE CV LAB;  Service: CV: Non-obst Ao-Iliac. R SFA-PopA patent with  significant calcific stenosis of TP trunk-prox PTA (dominant) & CTO ATA  -> R PTA-TP PTCA   ABDOMINAL AORTOGRAM W/LOWER EXTREMITY N/A 11/06/2022   Procedure: ABDOMINAL AORTOGRAM W/LOWER EXTREMITY;  Surgeon: Wenona Hamilton, MD;  Location: [facility] INVASIVE CV LAB;  Service: Cardiovascular;  [name];   ABDOMINAL AORTOGRAM W/LOWER EXTREMITY Right 04/01/2023   Procedure: ABDOMINAL AORTOGRAM W/LOWER EXTREMITY;  Surgeon: Margherita Shell, MD;  Location: [facility] INVASIVE CV LAB;  Service: Cardiovascular;  Laterality: Right;   ABIs  04/27/2012   mild bilateral arterial insufficiency   ANKLE FUSION Right 05/15/2023   Procedure: ARTHRODESIS ANKLE WITH HINDFOOT FUSION NAIL;  Surgeon: Laneta Pintos, MD;  [name];  Service: Orthopedics;  Laterality: Right;   APPLICATION OF WOUND VAC Right 03/31/2023   Procedure: APPLICATION OF WOUND VAC;  Surgeon: Laneta Pintos, MD;  [name];  Service: Orthopedics;  Laterality: Right;   BACK SURGERY  2005 x1   X2-2010   BUNIONECTOMY Right 11/11/2013   Procedure: RIGHT FOOT SILVER BUNIONECTOMY;  Surgeon: Amada Backer, MD;  Location: [facility];  Service: Orthopedics;  Laterality: Right;   CARDIAC CATHETERIZATION  12/11/2001   significant 3V CAD, normal LV function   CARDIOVERSION N/A 03/13/2022   Procedure: CARDIOVERSION;  Surgeon: Wendie Hamburg, MD;  Location[facility] ENDOSCOPY;  Service: Cardiovascular;  [name];   CARDIOVERSION N/A 07/25/2022   Procedure: CARDIOVERSION;  Surgeon: Hugh Madura, MD;  [facility]: [facility] INVAS[facility] CV LAB;  Service:  Cardiovascular;  [name];   Carotid Doppler  04/27/2012   right internal carotid: Elevated velocities but no evidence of plaque. Left internal carotid 40-59%   CAROTID ENDARTERECTOMY  2005   Right; recent carotid Dopplers notes elevated velocities.    colonscopy     CORONARY ANGIOPLASTY  12/21/2001   PTCA of the distal and mid AV groove circ, unsuccessful PTCA of second OM total  occlusion, unsuccessful PTCA of the apical LAD total occlusion   CORONARY ANGIOPLASTY WITH STENT PLACEMENT  04/29/2012   PCI to 3 RCA lesions, Promus Premiere 2.20mmx8mm distally, mid was 2.31mx28mm and proximally 2.75x57mm, EF 55-60%   CORONARY STENT PLACEMENT  04/28/2012   PCI to LAD (3x8mm Xience DES postdilated to 3.25) and circ prox and mid (2 overlappinmg 2.37mmx12mmXience DES postdilated to 2.67mm)   DOPPLER ECHOCARDIOGRAPHY  04/28/2012   poor quality study: EF estimated 60-65%; unable to assess diastolic function (previously noted to have diastolic dysfunction); severely dilated left atrium and mild right atrium; dilated IVC consistent with increased central venous pressure.Aaron Aas   EXTERNAL FIXATION LEG Right 05/12/2023   Procedure: EXTERNAL FIXATION, LOWER EXTREMITY;  Surgeon: Laneta Pintos, MD;  [name];  Service: Orthopedics;  Laterality: Right;   HARDWARE REMOVAL Right 03/31/2023   Procedure: HARDWARE REMOVAL RIGHT ANKLE;  Surgeon: Laneta Pintos, MD;  [name];  Service: Orthopedics;  Laterality: Right;   HARDWARE REMOVAL Right 01/28/2023   Procedure: REMOVAL OF HARDWARE RIGHT ANKLE;  Surgeon: Hardy Lia, MD;  [name];  Service: Orthopedics;  Laterality: Right;   HARDWARE REMOVAL Right 06/21/2023   Procedure: REMOVAL, HARDWARE;  Surgeon: Laneta Pintos, MD;  [name];  Service: Orthopedics;  Laterality: Right;   HERNIA REPAIR     I & D EXTREMITY Right 01/02/2023   Procedure: IRRIGATION AND DEBRIDEMENT RIGHT ANKLE;  Surgeon: Hardy Lia, MD;  [name];  Service: Orthopedics;  Laterality: Right;   I & D EXTREMITY Right 04/02/2023   Procedure: DEBRIDEMENT RIGHT ANKLE;  Surgeon: Timothy Ford, MD;  Location: [facility] OR;  Service: Orthopedics;  Laterality: Right;   IR FLUORO GUIDE CV LINE LEFT  04/22/2023   IR FLUORO GUIDE CV LINE RIGHT  04/07/2023   IR PATIENT EVAL TECH 0-60 MINS  04/07/2023   IR RADIOLOGIST EVAL & MGMT  04/08/2023   IR REMOVAL TUN CV  CATH W/O FL  05/05/2023   IR US  GUIDE VASC ACCESS LEFT  04/22/2023   IR US  GUIDE VASC ACCESS RIGHT  04/07/2023   LEFT AND RIGHT HEART CATHETERIZATION WITH CORONARY ANGIOGRAM N/A 04/27/2012   Procedure: LEFT AND RIGHT HEART CATHETERIZATION WITH CORONARY ANGIOGRAM;  Surgeon: Arleen Lacer, MD;  [facility] C[facility] LAB;  Service: Cardiovascular;  [name];   LEFT AND RIGHT HEART CATHETERIZATION WITH CORONARY ANGIOGRAM N/A 05/14/2013   Procedure: LEFT AND RIGHT HEART CATHETERIZATION WITH CORONARY ANGIOGRAM;  Surgeon: Millicent Ally, MD;  [name]; PCWP ;; [name], [name], Patent RCA ostial mid and distal stents - 70% distal runoff Dz   LUMBAR LAMINECTOMY/DECOMPRESSION MICRODISCECTOMY  01/07/2012   Procedure: LUMBAR LAMINECTOMY/DECOMPRESSION MICRODISCECTOMY 1 LEVEL;  Surgeon: Pasty Bongo, MD;  [name];  Service: Neurosurgery;  Laterality: Bilateral;   Lumbar Three-Four Decompression   LUMBAR LAMINECTOMY/DECOMPRESSION MICRODISCECTOMY N/A 07/26/2020   Procedure: Lumbar two-three Laminectomy/Foraminotomy;  Surgeon:  Audie Bleacher, MD;  Location: [facility] OR;  Service: Neurosurgery;  [name];   ORIF ANKLE FRACTURE Right 01/02/2023   Procedure: OPEN REDUCTION INTERNAL FIXATION (ORIF)RIGHT ANKLE FRACTURE;  Surgeon: Hardy Lia, MD;  [name];  Service: Orthopedics;  Laterality: Right;   PERCUTANEOUS CORONARY STENT INTERVENTION (PCI-S) N/A 04/28/2012   Procedure: PERCUTANEOUS CORONARY STENT INTERVENTION (PCI-S);  Surgeon: Millicent Ally, MD;  Location: [facility] CATH LAB;  Service: Cardiovascular;  [name];   PERCUTANEOUS CORONARY STENT INTERVENTION (PCI-S) N/A 04/29/2012   Procedure: PERCUTANEOUS CORONARY STENT INTERVENTION (PCI-S);  Surgeon: Arleen Lacer, MD;  Location: [facility] CATH LAB;  Service: Cardiovascular;  Laterality:  N/A;   PERIPHERAL VASCULAR ATHERECTOMY  11/06/2022   Procedure: PERIPHERAL VASCULAR ATHERECTOMY;  Surgeon: Wenona Hamilton, MD;  [facility];  Service: Cardiovascular;;   PERIPHERAL VASCULAR BALLOON ANGIOPLASTY Right 10/13/2019   Procedure: PERIPHERAL VASCULAR BALLOON ANGIOPLASTY;  Surgeon: Wenona Hamilton, MD;  [facility] LAB;  Service: Cardiovascular;  Laterality: Right;  PTCA of R TP Trunk into PTA (Drug-coated) => Follow-up ABIs 0.9 on the right and 0.99 on the left.   PERIPHERAL VASCULAR BALLOON ANGIOPLASTY Right 04/01/2023   Procedure: PERIPHERAL VASCULAR BALLOON ANGIOPLASTY;  Surgeon: Margherita Shell, MD;  [facility] CV LAB;  Service: Cardiovascular;  Laterality: Right;   PERIPHERAL VASCULAR INTERVENTION Right 04/01/2023   Procedure: PERIPHERAL VASCULAR INTERVENTION;  Surgeon: Margherita Shell, MD;  [facility] CV LAB;  Service: Cardiovascular;  Laterality: Right;   SPINE SURGERY     UMBILICAL HERNIA REPAIR  2009   steel mesh insert    Social History   Tobacco Use   Smoking status: Former    Current packs/day: 0.00    Average packs/day: 1 pack/day for 42.0 years (42.0 ttl pk-yrs)    Types: Cigarettes    Start date: 03/22/1961    Quit date: 03/23/2003    Years since quitting: 20.3   Smokeless tobacco: Never   Tobacco comments:    Former smoker 02/19/22  Vaping Use   Vaping status: Never Used  Substance Use Topics   Alcohol use: No    Alcohol/week: 0.0 standard drinks of alcohol   Drug use: No    Family History  Adopted: Yes  Family history unknown: Yes    Allergies  Allergen Reactions   Beta-Lactamase Inhibitors (Beta-Lactam) Hives, Itching and Rash    AX - penicillin     Ancef  given 9/24 with no obvious reaction   Metformin  And Related Diarrhea and Nausea Only   Other Nausea And Vomiting    General Anesthesia - sometimes causes nausea and vomiting * OK to use scopolamine  patch     Sulfa Antibiotics Hives and Other  (See Comments)    Bactrim   Wellbutrin [Bupropion] Other (See Comments)    Mood Changes   Tetracyclines & Related Hives and Rash    doxycycline     Health Maintenance  Topic Date Due   COVID-19 Vaccine (1) Never done   Zoster Vaccines- Shingrix (1 of 2) 09[birth year]01/1972   Diabetic kidney evaluation - Urine ACR  05/27/2012   OPHTHALMOLOGY EXAM  03/19/2015   FOOT EXAM  08/30/2015   DTaP/Tdap/Td (2 - Td or Tdap) 01/02/2023   INFLUENZA VACCINE  09/19/2023   Medicare Annual Wellness (AWV)  10/02/2023   HEMOGLOBIN A1C  01/18/2024   Diabetic kidney evaluation - eGFR measurement  07/21/2024   Colonoscopy  05/19/2029   Pneumonia Vaccine 24+ Years old  Completed  Hepatitis C Screening  Completed   HPV VACCINES  Aged Out   Meningococcal B Vaccine  Aged Out   Microbiology  Results for orders placed or performed during the hospital encounter of 07/17/23  Blood culture (routine x 2)     Status: None   Collection Time: 07/17/23 12:11 PM   Specimen: BLOOD  Result Value Ref Range Status   Specimen Description BLOOD SITE NOT SPECIFIED  Final   Special Requests   Final    BOTTLES DRAWN AEROBIC AND ANAEROBIC Blood Culture results may not be optimal due to an inadequate volume of blood received in culture bottles   Culture   Final    NO GROWTH 5 DAYS Performed at [facility], [address], [place], [place] [address]    Report Status 07/22/2023 FINAL  Final  Blood culture (routine x 2)     Status: None   Collection Time: 07/17/23  1:43 PM   Specimen: BLOOD LEFT HAND  Result Value Ref Range Status   Specimen Description BLOOD LEFT HAND  Final   Special Requests   Final    BOTTLES DRAWN AEROBIC ONLY Blood Culture results may not be optimal due to an inadequate volume of blood received in culture bottles   Culture   Final    NO GROWTH 5 DAYS Performed at [facility], [address] [place], [place] [address]    Report Status 07/22/2023 FINAL  Final   *Note: Due to a large  number of results and/or encounters for the requested time period, some results have not been displayed. A complete set of results can be found in Results Review.   Imaging  DG Chest Port 1 View Result Date: 07/21/2023 CLINICAL DATA:  PICC line placement EXAM: PORTABLE CHEST 1 VIEW COMPARISON:  Jul 17, 2023 FINDINGS: The heart size and mediastinal contours are within normal limits. Both lungs are clear. The visualized skeletal structures are unremarkable. Right PICC line tip in the SVC with position. IMPRESSION: No active disease. Electronically Signed   By: Fredrich Jefferson M.D.   On: 07/21/2023 09:02   DG Chest Port 1 View Result Date: 07/17/2023 CLINICAL DATA:  Weakness. EXAM: PORTABLE CHEST 1 VIEW COMPARISON:  05/26/2023 FINDINGS: Normal sized heart with an interval decrease in size. Clear lungs with normal vascularity. Thoracic spine degenerative changes. IMPRESSION: No acute abnormality. Electronically Signed   By: Catherin Closs M.D.   On: 07/17/2023 14:40     Objective: Vitals:   07/29/23 0827  Height: 6\' 1"  (1.854 m)    Physical Exam Constitutional:      Appearance: Normal appearance.  HENT:     Head: Normocephalic and atraumatic.      Mouth: Mucous membranes are moist.  Eyes:    Conjunctiva/sclera: Conjunctivae normal.     Pupils: Pupils are equal, round, and b/l symmetrical    Cardiovascular:     Rate and Rhythm: Normal rate and regular rhythm.     Heart sounds:   Pulmonary:     Effort: Pulmonary effort is normal.     Breath sounds:   Abdominal:     General: Non distended     Palpations:  Musculoskeletal:        General: sitting in the wheel chair Rt foot with chronic skin changes, appears to be clinically improving with no signs of acute infection   07/29/23 - the pictures below are old but the wound looks the same today -- small dimple right medial side  of the distal LE scabbing no discharge; no warmth/tenderness       Skin:    General: Skin is warm and dry.      Comments: rt arm picc ok with no signs of infection   Neurological:     General: grossly non focal     Mental Status: awake, alert and oriented to person, place, and time.   Psychiatric:        Mood and Affect: Mood normal.   Lab Results Lab Results  Component Value Date   WBC 6.8 07/21/2023   HGB 10.2 (L) 07/21/2023   HCT 30.9 (L) 07/21/2023   MCV 87.3 07/21/2023   PLT 228 07/21/2023    Lab Results  Component Value Date   CREATININE 1.67 (H) 07/22/2023   BUN 11 07/22/2023   NA 141 07/22/2023   K 3.5 07/22/2023   CL 110 07/22/2023   CO2 24 07/22/2023    Lab Results  Component Value Date   ALT 15 07/18/2023   AST 36 07/18/2023   GGT 28 02/20/2018   ALKPHOS 109 07/18/2023   BILITOT 0.8 07/18/2023    Lab Results  Component Value Date   CHOL 84 04/02/2023   HDL 24 (L) 04/02/2023   LDLCALC 28 04/02/2023   TRIG 161 (H) 04/02/2023   CHOLHDL 3.5 04/02/2023   No results found for: "LABRPR", "RPRTITER" No results found for: "HIV1RNAQUANT", "HIV1RNAVL", "CD4TABS"    Assessment/Plan # Complicated RT ankle wound  # Enterobacter cloacae bacteremia: source thought to be HDC vs rt ankle wound - s/p prolonged course of PO ciprofloxacin  on 4/26, restarted in the setting of new sores in the rt ankle  - Admitted 5/1-5/10 due to exposed hardware in rt ankle/drainage. He underwent  removal of rt ankle hardware by Dr Curtiss Dowdy on 06/21/23. Or cx not sent. Discharged on 6 weeks of IV daptomycin  and ertapenem  via PICC.   Plan  - complete 6 weeks of IV daptomycin  and ertapenem  through 6/12 as planned  - sending to ED due to low BP/patient complaining of dizziness and h/o passing out last week - Fu to be made pending ED visit   Addendum 5/30 at 8:22 am Per Dr Curtiss Dowdy, all hardware not out, rod is in and no immediate plans for removal.   ------------- 07/29/23 id clinic f/u I don't have opat labs; we have contacted the snf for this Recent admission a week prior cbc/cmp  unremarkable Exam and history indicate right distal LE infection controlled/doing well; small scabbed area  As not all hardware is removed -- I have sent message to dr Curtiss Dowdy to discuss location of the retained hardware in relationship to the infected area -- loose screws were removed previously  He finished empiric abx on 6/12 and picc can be removed then  F/u 2 weeks after abx finished (end of June or early July)  If dr Curtiss Dowdy is concerned about retained hardware near the area of infection I would put patient on oral abx for another 6 weeks     Jamesetta Mcbride, MD [facility] 07/29/2023, 8:58 AM

## 2023-07-29 NOTE — ED Notes (Signed)
 Dr. Drury Geralds notified of BP 195/73. No orders at this time.

## 2023-07-29 NOTE — ED Provider Notes (Signed)
 [name] Provider Note   CSN: [id number] Arrival date & time: 07/29/23  2022     History  Chief Complaint  Patient presents with   Abnormal Labs     Harold [name] is a 68 y.o. male with extensive PMH as listed below who is BIB [facility] from [employer] w/ routine labwork that showed a hemoglobin of 5.2. At this time patient has no complaints. Denies bleeding from anywhere that he knows of. Denies shortness of breath, chest pain. Is on eliquis  and plavix . Has PICC line in R upper arm for antibiotic treatment from an ankle fx back in February that got infected.  Currently on ertapenem  and daptomycin .   Past Medical History:  Diagnosis Date   Anemia    Anginal pain (HCC) 04/2013   Cardiac cath showed patent stents with distal LAD, circumflex-OM and RCA disease in small vessels.   Anxiety    Atrial fibrillation (HCC) 12/2021   CAD S/P percutaneous coronary angioplasty 2003, 04/2012   status post PCI to LAD, circumflex-OM 2, RCA   Carotid artery occlusion    Chronic renal insufficiency, stage II (mild)    Chronic venous insufficiency    varicosities, no reflux; dopplers 04/14/12- valvular insufficiency in the R and L GSV   Complication of anesthesia    COPD, mild (HCC)    Depression    situaltional    Diabetes mellitus    Diabetic neuropathy (HCC) 08/24/2019   Dyslipidemia associated with type 2 diabetes mellitus (HCC)    Fall at home, initial encounter 01/02/2023   GERD (gastroesophageal reflux disease)    History of cardioversion 12/2021   at [facility]   HTN (hypertension)    Hypothyroidism    Neuromuscular disorder (HCC)    neuropathy in feet   Neuropathy    notably improved following PCI with improved cardiac function   Obesity    Obesity, Class II, BMI 35.0-39.9, with comorbidity (see actual BMI)    BMI 39; wgt loss efforts in place; seeing Dietician   Open ankle fracture 01/02/2023   Orthostatic  hypotension    PONV (postoperative nausea and vomiting)    [place]   Pulmonary hypertension (HCC) 04/2012   PA pressure   Sleep apnea    uses nightly   Vitamin D  deficiency    per facility notes       Home Medications Prior to Admission medications   Medication Sig Start Date End Date Taking? Authorizing Provider  acetaminophen  (TYLENOL ) 500 MG tablet Take 1 tablet (500 mg total) by mouth every 6 (six) hours as needed for mild pain (pain score 1-3). 01/07/23   Hongalgi, Anand D, MD  allopurinol  (ZYLOPRIM ) 100 MG tablet Take 0.5 tablets (50 mg total) by mouth daily. 05/10/23   Modena Andes, MD  Amino Acids-Protein Hydrolys (PRO-STAT PO) Take 30 mLs by mouth in the morning and at bedtime.    [provider]  amiodarone  (PACERONE ) 200 MG tablet Take 1 tablet (200 mg total) by mouth 2 (two) times daily. 05/10/23   Modena Andes, MD  apixaban  (ELIQUIS ) 5 MG TABS tablet TAKE 1 TABLET BY MOUTH TWICE A DAY 02/17/23   Arleen Lacer, MD  atorvastatin  (LIPITOR) 40 MG tablet Take 1 tablet (40 mg total) by mouth daily. Patient taking differently: Take 40 mg by mouth at bedtime. 05/10/23 07/17/23  Modena Andes, MD  carvedilol  (COREG ) 3.125 MG tablet Take 3.125 mg by mouth 2 (two) times daily.  07/20/23   [provider]  clopidogrel  (PLAVIX ) 75 MG tablet Take 75 mg by mouth every morning. 02/04/23   [provider]  Continuous Glucose Sensor (FREESTYLE LIBRE 3 PLUS SENSOR) MISC Inject 1 Device into the skin every 14 (fourteen) days. 06/11/23   [provider]  daptomycin  (CUBICIN ) IVPB Inject 800 mg into the vein daily. Indication:  Hardware associated ankle osteomyelitis  First Dose: Yes Last Day of Therapy:  08/02/23 Labs - Once weekly:  CBC/D, BMP, and CPK Labs - Once weekly: ESR and CRP 06/28/23 08/07/23  Doroteo Gasmen, MD  docusate sodium  (COLACE) 100 MG capsule Take 1 capsule (100 mg total) by mouth 2 (two) times daily. 06/28/23   Doroteo Gasmen, MD  DULoxetine  (CYMBALTA ) 20 MG capsule Take 1 capsule (20 mg total) by mouth daily. 07/22/23   Wouk, Haynes Lips, MD  ertapenem  (INVANZ ) IVPB Inject 1 g into the vein daily. Indication:  Hardware associated ankle osteomyelitis  First Dose: Yes Last Day of Therapy:  08/02/23 Labs - Once weekly:  CBC/D and BMP, Labs - Once weekly: ESR and CRP 06/28/23 08/07/23  Doroteo Gasmen, MD  ezetimibe  (ZETIA ) 10 MG tablet Take 10 mg by mouth at bedtime. 05/09/22   [provider]  ferrous sulfate  325 (65 FE) MG tablet Take 325 mg by mouth 3 (three) times a week. Monday, Wednesday, Friday 06/08/23   [provider]  finasteride  (PROSCAR ) 5 MG tablet Take 5 mg by mouth daily.    [provider]  fludrocortisone  (FLORINEF ) 0.1 MG tablet Take 1 tablet (0.1 mg total) by mouth daily. 07/15/23   Wenona Hamilton, MD  furosemide  (LASIX ) 40 MG tablet Take 40 mg by mouth daily.    [provider]  gabapentin  (NEURONTIN ) 300 MG capsule Take 1 capsule (300 mg total) by mouth at bedtime. 05/10/23   Modena Andes, MD  insulin  glargine (LANTUS  SOLOSTAR) 100 UNIT/ML Solostar Pen Inject 5 Units into the skin at bedtime. 07/22/23   Wouk, Haynes Lips, MD  insulin  lispro (HUMALOG) 100 UNIT/ML injection Inject 0-15 Units into the skin in the morning, at noon, in the evening, and at bedtime. Per sliding scale: 0-70= 0 units if <70 implement hypoglycemic protocol & notify MD/NP 71-150= 0 units 151-200=3 units 201-250= 6 units 251-300= 9 units 301-350= 12 units 351-400= 15 units 401-999 if CBG>400. Notify MD/ NP,    [provider]  levothyroxine  (SYNTHROID , LEVOTHROID) 88 MCG tablet Take 88 mcg by mouth daily before breakfast.    [provider]  magnesium  oxide (MAG-OX) 400 MG tablet Take 1 tablet by mouth daily. 06/08/23   [provider]  methocarbamol  (ROBAXIN ) 500 MG tablet Take 1 tablet (500 mg total) by mouth every 6 (six) hours as needed for  muscle spasms. 05/20/23   Versie Gores, PA-C  midodrine  (PROAMATINE ) 10 MG tablet Take 1 tablet (10 mg total) by mouth 3 (three) times daily with meals. Take one hour prior to physical therapy 07/15/23   Wenona Hamilton, MD  Multiple Vitamin (MULTIVITAMIN WITH MINERALS) TABS tablet Take 1 tablet by mouth daily. 01/08/23   Hongalgi, Anand D, MD  nitroGLYCERIN  (NITROSTAT ) 0.4 MG SL tablet PLACE 1 TAB UNDER THE TONGUE EVERY 5 MINUTES AS NEEDED FOR CHEST PAIN (SEVERE PRESSURE OR TIGHTNESS) Patient taking differently: Place 0.4 mg under the tongue every 5 (five) minutes as needed for chest pain. PLACE 1 TAB UNDER THE TONGUE EVERY 5 MINUTES AS NEEDED FOR CHEST PAIN (SEVERE  PRESSURE OR TIGHTNESS) 02/22/19   Arleen Lacer, MD  nutrition supplement, JUVEN, Donell Fuller) PACK Take 1 packet by mouth 2 (two) times daily between meals. 06/28/23   Doroteo Gasmen, MD  Nystatin (GERHARDT'S BUTT CREAM) CREA Apply 1 Application topically 2 (two) times daily. 06/28/23   Fonnie Iba I, MD  pantoprazole  (PROTONIX ) 40 MG tablet TAKE 1 TABLET BY MOUTH EVERY DAY 12/13/22   Dunn, Dayna N, PA-C  polyethylene glycol (MIRALAX  / GLYCOLAX ) 17 g packet Take 17 g by mouth daily as needed for mild constipation. 06/28/23   Doroteo Gasmen, MD  sertraline  (ZOLOFT ) 100 MG tablet Take 1 tablet (100 mg total) by mouth daily. 07/22/23   Wouk, Haynes Lips, MD  tirzepatide  (MOUNJARO ) 15 MG/0.5ML Pen Inject 15 mg into the skin once a week. Patient taking differently: Inject 15 mg into the skin once a week. Sunday 12/30/22     Vitamin D , Ergocalciferol , (DRISDOL ) 1.25 MG (50000 UNIT) CAPS capsule Take 1 capsule (50,000 Units total) by mouth every 7 (seven) days. Patient taking differently: Take 50,000 Units by mouth every 7 (seven) days. Sundays 05/27/23   Haydee Lipa, MD      Allergies    Beta-lactamase inhibitors (beta-lactam), Metformin  and related, Other, Sulfa antibiotics, Wellbutrin [bupropion], and Tetracyclines &  related    Review of Systems   Review of Systems A 10 point review of systems was performed and is negative unless otherwise reported in HPI.  Physical Exam Updated Vital Signs BP (!) 164/81   Pulse 70   Temp (!) 97.4 F (36.3 C) (Oral)   Resp 18   Ht 6' 1 (1.854 m)   Wt 102 kg   SpO2 100%   BMI 29.67 kg/m  Physical Exam General: Normal appearing elderly male, lying in bed.  HEENT: Sclera anicteric, MMM, trachea midline.  Cardiology: RRR, no murmurs/rubs/gallops. Resp: Normal respiratory rate and effort. CTAB, no wheezes, rhonchi, crackles.  Abd: Soft, non-tender, non-distended. No rebound tenderness or guarding.  GU: Deferred. MSK: No peripheral edema or signs of trauma. Skin: warm, dry.  Neuro: A&Ox4, CNs II-XII grossly intact. MAEs. Sensation grossly intact.  Psych: Normal mood and affect.   ED Results / Procedures / Treatments   Labs (all labs ordered are listed, but only abnormal results are displayed) Labs Reviewed  CBC WITH DIFFERENTIAL/PLATELET - Abnormal; Notable for the following components:      Result Value   RBC 3.41 (*)    Hemoglobin 10.2 (*)    HCT 29.7 (*)    RDW 16.4 (*)    All other components within normal limits  COMPREHENSIVE METABOLIC PANEL WITH GFR - Abnormal; Notable for the following components:   Potassium 3.3 (*)    CO2 21 (*)    Creatinine, Ser 2.34 (*)    Calcium  7.9 (*)    Total Protein 5.1 (*)    Albumin  1.9 (*)    GFR, Estimated 29 (*)    All other components within normal limits  TYPE AND SCREEN    EKG EKG Interpretation Date/Time:  Tuesday July 29 2023 20:28:51 EDT Ventricular Rate:  67 PR Interval:    QRS Duration:  132 QT Interval:  569 QTC Calculation: 601 R Axis:   67  Text Interpretation: Junctional rhythm Nonspecific intraventricular conduction delay ST changes similar to prior Prolonged QT Confirmed by Annita Kindle [phone]) on 07/29/2023 9:09:51 PM  Radiology No results found.  Procedures Procedures     Medications Ordered in ED Medications  magnesium   sulfate IVPB 2 g 50 mL (2 g Intravenous New Bag/Given 07/29/23 2223)  calcium  gluconate 1 g/ 50 mL sodium chloride  IVPB (0 mg Intravenous Stopped 07/29/23 2224)  potassium chloride  SA (KLOR-CON  M) CR tablet 20 mEq (20 mEq Oral Given 07/29/23 2122)    ED Course/ Medical Decision Making/ A&P                          Medical Decision Making Amount and/or Complexity of Data Reviewed Labs: ordered. Decision-making details documented in ED Course.  Risk Prescription drug management. Decision regarding hospitalization.    This patient presents to the ED for concern of abnormal labs, this involves an extensive number of treatment options, and is a complaint that carries with it a high risk of complications and morbidity.  I considered the following differential and admission for this acute, potentially life threatening condition.   MDM:    Patient was sent for evaluation and possible transfusion of hgb 5.2. On recheck it is 10.2.   Patient is noted to have very prolonged QTc on EKG, at least , which is worsening from prior EKGs. Has hypocalcemia to 7.9 which could be contributing. Repleted 1g IV calcium  gluconate and rechecked QTc without any significant change. He is on ertapenem  and daptomycin , neither of which are known to prolong QT interval. He does take amiodarone , and was recently started on midodrine , which could possibly cause QT prolongation. Repleted both Mg and Ca and repeated EKG with no change in QTc.    Clinical Course as of 08/03/23 1126  Tue Jul 29, 2023  2107 Hemoglobin(!): 10.2 Stable Hgb from 10 days ago. Value of 5.2 that was reported earlier was likely erroneous. [HN]  2108 Creatinine(!): 2.34 Very mild elevation from BL 1.6-2.2 [HN]  2112 Calcium (!): 7.9 +hypocalcemia with prolonged QT on EKG. Will replete with 1g calcium  gluconate.  [HN]  2113 Potassium(!): 3.3 Mild hypokalemia, will replete [HN]  2300  Stable 67YOM with a ccx of lab abnormality  HGB 5  QTC 600 Hypocalcemia Recently started on midodrine . [CC]  2343 Discussed with Cardiology. Hold QTC prolonging meds.  [CC]  Wed Jul 30, 2023  0040 Medication changes discussed to  [CC]    Clinical Course User Index [CC] Onetha Bile, MD [HN] Merdis Stalling, MD    Labs: I Ordered, and personally interpreted labs.  The pertinent results include: those listed above  Additional history obtained from chart review.    Cardiac Monitoring: The patient was maintained on a cardiac monitor.  I personally viewed and interpreted the cardiac monitored which showed an underlying rhythm of: NSR  Reevaluation: After the interventions noted above, I reevaluated the patient and found that they have :stayed the same  Social Determinants of Health: Lives independently  Disposition:  Signed out to oncoming EDP Dr. Urban Garden with plans for admission for prolonged QTc  Co morbidities that complicate the patient evaluation  Past Medical History:  Diagnosis Date   Anemia    Anginal pain (HCC) 04/2013   Cardiac cath showed patent stents with distal LAD, circumflex-OM and RCA disease in small vessels.   Anxiety    Atrial fibrillation (HCC) 12/2021   CAD S/P percutaneous coronary angioplasty 2003, 04/2012   status post PCI to LAD, circumflex-OM 2, RCA   Carotid artery occlusion    Chronic renal insufficiency, stage II (mild)    Chronic venous insufficiency    varicosities, no reflux; dopplers 04/14/12- valvular insufficiency in the R  and L GSV   Complication of anesthesia    COPD, mild (HCC)    Depression    situaltional    Diabetes mellitus    Diabetic neuropathy (HCC) 08/24/2019   Dyslipidemia associated with type 2 diabetes mellitus (HCC)    Fall at home, initial encounter 01/02/2023   GERD (gastroesophageal reflux disease)    History of cardioversion 12/2021   at [facility]   HTN (hypertension)    Hypothyroidism    Neuromuscular  disorder (HCC)    neuropathy in feet   Neuropathy    notably improved following PCI with improved cardiac function   Obesity    Obesity, Class II, BMI 35.0-39.9, with comorbidity (see actual BMI)    BMI 39; wgt loss efforts in place; seeing Dietician   Open ankle fracture 01/02/2023   Orthostatic hypotension    PONV (postoperative nausea and vomiting)    [place]   Pulmonary hypertension (HCC) 04/2012   PA pressure   Sleep apnea    uses nightly   Vitamin D  deficiency    per facility notes     Medicines Meds ordered this encounter  Medications   calcium  gluconate 1 g/ 50 mL sodium chloride  IVPB   DISCONTD: potassium chloride  SA (KLOR-CON  M) CR tablet 40 mEq   potassium chloride  SA (KLOR-CON  M) CR tablet 20 mEq   magnesium  sulfate IVPB 2 g 50 mL    I have reviewed the patients home medicines and have made adjustments as needed  Problem List / ED Course: Problem List Items Addressed This Visit   None Visit Diagnoses       Prolonged Q-T interval on ECG    -  Primary     Hypocalcemia                       This note was created using dictation software, which may contain spelling or grammatical errors.    Merdis Stalling, MD 08/03/23 1130

## 2023-07-30 ENCOUNTER — Encounter (HOSPITAL_COMMUNITY): Payer: Self-pay | Admitting: Internal Medicine

## 2023-07-30 ENCOUNTER — Other Ambulatory Visit: Payer: Self-pay

## 2023-07-30 DIAGNOSIS — G629 Polyneuropathy, unspecified: Secondary | ICD-10-CM

## 2023-07-30 DIAGNOSIS — E1151 Type 2 diabetes mellitus with diabetic peripheral angiopathy without gangrene: Secondary | ICD-10-CM | POA: Diagnosis present

## 2023-07-30 DIAGNOSIS — I739 Peripheral vascular disease, unspecified: Secondary | ICD-10-CM

## 2023-07-30 DIAGNOSIS — I4581 Long QT syndrome: Secondary | ICD-10-CM

## 2023-07-30 DIAGNOSIS — R404 Transient alteration of awareness: Secondary | ICD-10-CM | POA: Diagnosis not present

## 2023-07-30 DIAGNOSIS — E785 Hyperlipidemia, unspecified: Secondary | ICD-10-CM | POA: Diagnosis present

## 2023-07-30 DIAGNOSIS — Z8709 Personal history of other diseases of the respiratory system: Secondary | ICD-10-CM

## 2023-07-30 DIAGNOSIS — F331 Major depressive disorder, recurrent, moderate: Secondary | ICD-10-CM

## 2023-07-30 DIAGNOSIS — R6889 Other general symptoms and signs: Secondary | ICD-10-CM | POA: Diagnosis not present

## 2023-07-30 DIAGNOSIS — I10 Essential (primary) hypertension: Secondary | ICD-10-CM | POA: Diagnosis not present

## 2023-07-30 DIAGNOSIS — N170 Acute kidney failure with tubular necrosis: Secondary | ICD-10-CM | POA: Diagnosis not present

## 2023-07-30 DIAGNOSIS — R9431 Abnormal electrocardiogram [ECG] [EKG]: Secondary | ICD-10-CM

## 2023-07-30 DIAGNOSIS — Z794 Long term (current) use of insulin: Secondary | ICD-10-CM | POA: Diagnosis not present

## 2023-07-30 DIAGNOSIS — D649 Anemia, unspecified: Secondary | ICD-10-CM | POA: Diagnosis not present

## 2023-07-30 DIAGNOSIS — E860 Dehydration: Secondary | ICD-10-CM | POA: Diagnosis present

## 2023-07-30 DIAGNOSIS — E1165 Type 2 diabetes mellitus with hyperglycemia: Secondary | ICD-10-CM | POA: Diagnosis not present

## 2023-07-30 DIAGNOSIS — Z8679 Personal history of other diseases of the circulatory system: Secondary | ICD-10-CM | POA: Diagnosis not present

## 2023-07-30 DIAGNOSIS — G4733 Obstructive sleep apnea (adult) (pediatric): Secondary | ICD-10-CM

## 2023-07-30 DIAGNOSIS — E119 Type 2 diabetes mellitus without complications: Secondary | ICD-10-CM

## 2023-07-30 DIAGNOSIS — M86171 Other acute osteomyelitis, right ankle and foot: Secondary | ICD-10-CM | POA: Diagnosis not present

## 2023-07-30 DIAGNOSIS — N400 Enlarged prostate without lower urinary tract symptoms: Secondary | ICD-10-CM | POA: Insufficient documentation

## 2023-07-30 DIAGNOSIS — F32A Depression, unspecified: Secondary | ICD-10-CM | POA: Diagnosis present

## 2023-07-30 DIAGNOSIS — E038 Other specified hypothyroidism: Secondary | ICD-10-CM

## 2023-07-30 DIAGNOSIS — E876 Hypokalemia: Secondary | ICD-10-CM | POA: Diagnosis not present

## 2023-07-30 DIAGNOSIS — R42 Dizziness and giddiness: Secondary | ICD-10-CM | POA: Diagnosis not present

## 2023-07-30 DIAGNOSIS — J449 Chronic obstructive pulmonary disease, unspecified: Secondary | ICD-10-CM | POA: Diagnosis present

## 2023-07-30 DIAGNOSIS — F411 Generalized anxiety disorder: Secondary | ICD-10-CM | POA: Diagnosis present

## 2023-07-30 DIAGNOSIS — E039 Hypothyroidism, unspecified: Secondary | ICD-10-CM | POA: Diagnosis not present

## 2023-07-30 DIAGNOSIS — E43 Unspecified severe protein-calorie malnutrition: Secondary | ICD-10-CM | POA: Diagnosis not present

## 2023-07-30 DIAGNOSIS — E1122 Type 2 diabetes mellitus with diabetic chronic kidney disease: Secondary | ICD-10-CM | POA: Diagnosis present

## 2023-07-30 DIAGNOSIS — I4819 Other persistent atrial fibrillation: Secondary | ICD-10-CM

## 2023-07-30 DIAGNOSIS — M6281 Muscle weakness (generalized): Secondary | ICD-10-CM | POA: Diagnosis not present

## 2023-07-30 DIAGNOSIS — I70234 Atherosclerosis of native arteries of right leg with ulceration of heel and midfoot: Secondary | ICD-10-CM | POA: Diagnosis not present

## 2023-07-30 DIAGNOSIS — Z7401 Bed confinement status: Secondary | ICD-10-CM | POA: Diagnosis not present

## 2023-07-30 DIAGNOSIS — E559 Vitamin D deficiency, unspecified: Secondary | ICD-10-CM | POA: Diagnosis not present

## 2023-07-30 DIAGNOSIS — L97411 Non-pressure chronic ulcer of right heel and midfoot limited to breakdown of skin: Secondary | ICD-10-CM | POA: Diagnosis not present

## 2023-07-30 DIAGNOSIS — R7881 Bacteremia: Secondary | ICD-10-CM | POA: Diagnosis present

## 2023-07-30 DIAGNOSIS — Z951 Presence of aortocoronary bypass graft: Secondary | ICD-10-CM | POA: Diagnosis not present

## 2023-07-30 DIAGNOSIS — I2510 Atherosclerotic heart disease of native coronary artery without angina pectoris: Secondary | ICD-10-CM | POA: Diagnosis not present

## 2023-07-30 DIAGNOSIS — I959 Hypotension, unspecified: Secondary | ICD-10-CM | POA: Diagnosis not present

## 2023-07-30 DIAGNOSIS — I499 Cardiac arrhythmia, unspecified: Secondary | ICD-10-CM | POA: Diagnosis not present

## 2023-07-30 DIAGNOSIS — E669 Obesity, unspecified: Secondary | ICD-10-CM | POA: Diagnosis present

## 2023-07-30 DIAGNOSIS — I480 Paroxysmal atrial fibrillation: Secondary | ICD-10-CM

## 2023-07-30 DIAGNOSIS — E1140 Type 2 diabetes mellitus with diabetic neuropathy, unspecified: Secondary | ICD-10-CM | POA: Diagnosis not present

## 2023-07-30 DIAGNOSIS — N1832 Chronic kidney disease, stage 3b: Secondary | ICD-10-CM

## 2023-07-30 DIAGNOSIS — I129 Hypertensive chronic kidney disease with stage 1 through stage 4 chronic kidney disease, or unspecified chronic kidney disease: Secondary | ICD-10-CM | POA: Diagnosis present

## 2023-07-30 DIAGNOSIS — D631 Anemia in chronic kidney disease: Secondary | ICD-10-CM | POA: Diagnosis present

## 2023-07-30 DIAGNOSIS — E871 Hypo-osmolality and hyponatremia: Secondary | ICD-10-CM | POA: Diagnosis not present

## 2023-07-30 DIAGNOSIS — T847XXS Infection and inflammatory reaction due to other internal orthopedic prosthetic devices, implants and grafts, sequela: Secondary | ICD-10-CM

## 2023-07-30 DIAGNOSIS — K219 Gastro-esophageal reflux disease without esophagitis: Secondary | ICD-10-CM | POA: Diagnosis not present

## 2023-07-30 DIAGNOSIS — I272 Pulmonary hypertension, unspecified: Secondary | ICD-10-CM | POA: Diagnosis present

## 2023-07-30 DIAGNOSIS — N179 Acute kidney failure, unspecified: Secondary | ICD-10-CM | POA: Diagnosis present

## 2023-07-30 DIAGNOSIS — T84624D Infection and inflammatory reaction due to internal fixation device of right fibula, subsequent encounter: Secondary | ICD-10-CM | POA: Diagnosis not present

## 2023-07-30 DIAGNOSIS — Z743 Need for continuous supervision: Secondary | ICD-10-CM | POA: Diagnosis not present

## 2023-07-30 LAB — COMPREHENSIVE METABOLIC PANEL WITH GFR
ALT: 25 U/L (ref 0–44)
AST: 37 U/L (ref 15–41)
Albumin: 2 g/dL — ABNORMAL LOW (ref 3.5–5.0)
Alkaline Phosphatase: 115 U/L (ref 38–126)
Anion gap: 11 (ref 5–15)
BUN: 22 mg/dL (ref 8–23)
CO2: 20 mmol/L — ABNORMAL LOW (ref 22–32)
Calcium: 8.3 mg/dL — ABNORMAL LOW (ref 8.9–10.3)
Chloride: 107 mmol/L (ref 98–111)
Creatinine, Ser: 2.37 mg/dL — ABNORMAL HIGH (ref 0.61–1.24)
GFR, Estimated: 29 mL/min — ABNORMAL LOW (ref >60)
Glucose, Bld: 107 mg/dL — ABNORMAL HIGH (ref 70–99)
Potassium: 3 mmol/L — ABNORMAL LOW (ref 3.5–5.1)
Sodium: 138 mmol/L (ref 135–145)
Total Bilirubin: 1 mg/dL (ref 0.0–1.2)
Total Protein: 5.1 g/dL — ABNORMAL LOW (ref 6.5–8.1)

## 2023-07-30 LAB — CBC
HCT: 31 % — ABNORMAL LOW (ref 39.0–52.0)
Hemoglobin: 10.2 g/dL — ABNORMAL LOW (ref 13.0–17.0)
MCH: 29 pg (ref 26.0–34.0)
MCHC: 32.9 g/dL (ref 30.0–36.0)
MCV: 88.1 fL (ref 80.0–100.0)
Platelets: 239 10*3/uL (ref 150–400)
RBC: 3.52 MIL/uL — ABNORMAL LOW (ref 4.22–5.81)
RDW: 16.8 % — ABNORMAL HIGH (ref 11.5–15.5)
WBC: 6.4 10*3/uL (ref 4.0–10.5)
nRBC: 0 % (ref 0.0–0.2)

## 2023-07-30 LAB — MAGNESIUM: Magnesium: 2.4 mg/dL (ref 1.7–2.4)

## 2023-07-30 LAB — T4, FREE: Free T4: 0.63 ng/dL (ref 0.61–1.12)

## 2023-07-30 LAB — CBG MONITORING, ED: Glucose-Capillary: 106 mg/dL — ABNORMAL HIGH (ref 70–99)

## 2023-07-30 LAB — TSH: TSH: 8.948 u[IU]/mL — ABNORMAL HIGH (ref 0.350–4.500)

## 2023-07-30 MED ORDER — ACETAMINOPHEN 325 MG PO TABS: 650.0000 mg | ORAL_TABLET | Freq: Four times a day (QID) | ORAL | Status: DC | PRN

## 2023-07-30 MED ORDER — POTASSIUM CHLORIDE CRYS ER 20 MEQ PO TBCR
40.0000 meq | EXTENDED_RELEASE_TABLET | ORAL | Status: AC
  Administered 2023-07-30: 40 meq via ORAL
  Filled 2023-07-30: qty 2

## 2023-07-30 MED ORDER — GERHARDT'S BUTT CREAM: 1.0000 | TOPICAL_CREAM | Freq: Two times a day (BID) | CUTANEOUS | Status: DC

## 2023-07-30 MED ORDER — ATORVASTATIN CALCIUM 40 MG PO TABS
40.0000 mg | ORAL_TABLET | Freq: Every day | ORAL | Status: DC
  Administered 2023-07-30 – 2023-08-08 (×10): 40 mg via ORAL
  Filled 2023-07-30 (×10): qty 1

## 2023-07-30 MED ORDER — TRIMETHOBENZAMIDE HCL 100 MG/ML IM SOLN: 200.0000 mg | Freq: Three times a day (TID) | INTRAMUSCULAR | Status: DC | PRN

## 2023-07-30 MED ORDER — FERROUS SULFATE 325 (65 FE) MG PO TABS
325.0000 mg | ORAL_TABLET | ORAL | Status: DC
  Administered 2023-07-30 – 2023-08-08 (×5): 325 mg via ORAL
  Filled 2023-07-30 (×5): qty 1

## 2023-07-30 MED ORDER — METHOCARBAMOL 500 MG PO TABS: 500.0000 mg | ORAL_TABLET | Freq: Four times a day (QID) | ORAL | Status: DC | PRN

## 2023-07-30 MED ORDER — ERTAPENEM IV (FOR PTA / DISCHARGE USE ONLY): 1.0000 g | INTRAVENOUS | Status: DC

## 2023-07-30 MED ORDER — POTASSIUM CHLORIDE 20 MEQ PO PACK: 40.0000 meq | PACK | Freq: Once | ORAL | Status: DC

## 2023-07-30 MED ORDER — POTASSIUM CHLORIDE CRYS ER 20 MEQ PO TBCR: 40.0000 meq | EXTENDED_RELEASE_TABLET | Freq: Once | ORAL | Status: DC

## 2023-07-30 MED ORDER — SODIUM CHLORIDE 0.9 % IV SOLN: 250.0000 mL | INTRAVENOUS | Status: AC | PRN

## 2023-07-30 MED ORDER — GABAPENTIN 300 MG PO CAPS
300.0000 mg | ORAL_CAPSULE | Freq: Every day | ORAL | Status: DC
  Administered 2023-07-30 – 2023-08-07 (×9): 300 mg via ORAL
  Filled 2023-07-30 (×9): qty 1

## 2023-07-30 MED ORDER — EZETIMIBE 10 MG PO TABS
10.0000 mg | ORAL_TABLET | Freq: Every day | ORAL | Status: DC
  Administered 2023-07-30 – 2023-08-07 (×9): 10 mg via ORAL
  Filled 2023-07-30 (×9): qty 1

## 2023-07-30 MED ORDER — SODIUM CHLORIDE 0.9% FLUSH
10.0000 mL | Freq: Two times a day (BID) | INTRAVENOUS | Status: DC
  Administered 2023-07-30 – 2023-08-08 (×18): 10 mL

## 2023-07-30 MED ORDER — ALLOPURINOL 100 MG PO TABS
50.0000 mg | ORAL_TABLET | Freq: Every day | ORAL | Status: DC
  Administered 2023-07-30 – 2023-08-08 (×10): 50 mg via ORAL
  Filled 2023-07-30 (×10): qty 1

## 2023-07-30 MED ORDER — DAPTOMYCIN IV (FOR PTA / DISCHARGE USE ONLY): 800.0000 mg | INTRAVENOUS | Status: DC

## 2023-07-30 MED ORDER — LACTATED RINGERS IV SOLN: INTRAVENOUS | Status: AC

## 2023-07-30 MED ORDER — SODIUM CHLORIDE 0.9 % IV SOLN
800.0000 mg | Freq: Every day | INTRAVENOUS | Status: AC
  Administered 2023-07-30: 800 mg via INTRAVENOUS
  Filled 2023-07-30: qty 16

## 2023-07-30 MED ORDER — HYDRALAZINE HCL 20 MG/ML IJ SOLN
5.0000 mg | Freq: Four times a day (QID) | INTRAMUSCULAR | Status: DC | PRN
  Administered 2023-07-30: 5 mg via INTRAVENOUS
  Filled 2023-07-30: qty 1

## 2023-07-30 MED ORDER — HYDRALAZINE HCL 20 MG/ML IJ SOLN
10.0000 mg | Freq: Four times a day (QID) | INTRAMUSCULAR | Status: DC | PRN
  Administered 2023-07-30 – 2023-08-01 (×2): 10 mg via INTRAVENOUS
  Filled 2023-07-30 (×2): qty 1

## 2023-07-30 MED ORDER — IPRATROPIUM BROMIDE 0.02 % IN SOLN: 0.5000 mg | RESPIRATORY_TRACT | Status: DC | PRN

## 2023-07-30 MED ORDER — POTASSIUM CHLORIDE CRYS ER 20 MEQ PO TBCR: 20.0000 meq | EXTENDED_RELEASE_TABLET | Freq: Once | ORAL | Status: DC

## 2023-07-30 MED ORDER — SODIUM CHLORIDE 0.9% FLUSH: 10.0000 mL | INTRAVENOUS | Status: DC | PRN

## 2023-07-30 MED ORDER — LEVOTHYROXINE SODIUM 88 MCG PO TABS
88.0000 ug | ORAL_TABLET | Freq: Every day | ORAL | Status: DC
  Administered 2023-07-30 – 2023-08-08 (×10): 88 ug via ORAL
  Filled 2023-07-30 (×10): qty 1

## 2023-07-30 MED ORDER — PANTOPRAZOLE SODIUM 40 MG PO TBEC
40.0000 mg | DELAYED_RELEASE_TABLET | Freq: Every day | ORAL | Status: DC
  Administered 2023-07-30 – 2023-08-08 (×10): 40 mg via ORAL
  Filled 2023-07-30 (×10): qty 1

## 2023-07-30 MED ORDER — SODIUM CHLORIDE 0.9% FLUSH
3.0000 mL | Freq: Two times a day (BID) | INTRAVENOUS | Status: DC
  Administered 2023-07-30 – 2023-08-08 (×13): 3 mL via INTRAVENOUS

## 2023-07-30 MED ORDER — CHLORHEXIDINE GLUCONATE CLOTH 2 % EX PADS
6.0000 | MEDICATED_PAD | Freq: Every day | CUTANEOUS | Status: DC
  Administered 2023-07-31 – 2023-08-08 (×9): 6 via TOPICAL

## 2023-07-30 MED ORDER — FINASTERIDE 5 MG PO TABS
5.0000 mg | ORAL_TABLET | Freq: Every day | ORAL | Status: DC
  Administered 2023-07-30 – 2023-08-08 (×10): 5 mg via ORAL
  Filled 2023-07-30 (×10): qty 1

## 2023-07-30 MED ORDER — ACETAMINOPHEN 650 MG RE SUPP
650.0000 mg | Freq: Four times a day (QID) | RECTAL | Status: DC | PRN
Start: 2023-07-30 — End: 2023-08-09

## 2023-07-30 MED ORDER — SODIUM CHLORIDE 0.9 % IV SOLN
1.0000 g | INTRAVENOUS | Status: AC
  Administered 2023-07-30 – 2023-07-31 (×2): 1 g via INTRAVENOUS
  Filled 2023-07-30 (×3): qty 1000

## 2023-07-30 MED ORDER — SENNOSIDES-DOCUSATE SODIUM 8.6-50 MG PO TABS: 1.0000 | ORAL_TABLET | Freq: Every evening | ORAL | Status: DC | PRN

## 2023-07-30 MED ORDER — HYDROXYZINE HCL 25 MG PO TABS: 25.0000 mg | ORAL_TABLET | Freq: Three times a day (TID) | ORAL | Status: DC | PRN

## 2023-07-30 MED ORDER — APIXABAN 5 MG PO TABS
5.0000 mg | ORAL_TABLET | Freq: Two times a day (BID) | ORAL | Status: DC
  Administered 2023-07-30 – 2023-08-08 (×19): 5 mg via ORAL
  Filled 2023-07-30 (×19): qty 1

## 2023-07-30 MED ORDER — ORAL CARE MOUTH RINSE: 15.0000 mL | OROMUCOSAL | Status: DC | PRN

## 2023-07-30 MED ORDER — FLUDROCORTISONE ACETATE 0.1 MG PO TABS: 0.1000 mg | ORAL_TABLET | Freq: Every day | ORAL | Status: DC

## 2023-07-30 MED ORDER — CLOPIDOGREL BISULFATE 75 MG PO TABS
75.0000 mg | ORAL_TABLET | Freq: Every morning | ORAL | Status: DC
  Administered 2023-07-30 – 2023-08-08 (×10): 75 mg via ORAL
  Filled 2023-07-30 (×10): qty 1

## 2023-07-30 MED ORDER — SODIUM CHLORIDE 0.9% FLUSH
3.0000 mL | Freq: Two times a day (BID) | INTRAVENOUS | Status: DC
  Administered 2023-07-30 – 2023-08-08 (×15): 3 mL via INTRAVENOUS

## 2023-07-30 MED ORDER — CARVEDILOL 3.125 MG PO TABS
3.1250 mg | ORAL_TABLET | Freq: Two times a day (BID) | ORAL | Status: DC
  Administered 2023-07-30 – 2023-08-02 (×9): 3.125 mg via ORAL
  Filled 2023-07-30 (×9): qty 1

## 2023-07-30 MED ORDER — HYDROXYZINE HCL 25 MG PO TABS: 25.0000 mg | ORAL_TABLET | Freq: Four times a day (QID) | ORAL | Status: DC | PRN

## 2023-07-30 MED ORDER — SODIUM CHLORIDE 0.9% FLUSH: 3.0000 mL | INTRAVENOUS | Status: DC | PRN

## 2023-07-30 NOTE — Progress Notes (Signed)
 Interval CV note: Fellow note reviewed. EKG reviewed.  Recheck daily EKG and hold nonessential QT prolonging medications.  Agree with primary team discussing ABX duration with ID.  Cardiology to follow.  Will consider resuming amiodarone  tomorrow.  Risha Barretta A Vaughan Garfinkle, MD

## 2023-07-30 NOTE — Progress Notes (Signed)
 Progress Note   Patient: Harold [name] ZOX:[id number] DOB: 27-Oct-1955 DOA: 07/29/2023     0 DOS: the patient was seen and examined on 07/30/2023   Brief hospital course: From HPI Harold [name] is a 68 y.o. male with medical history significant of  Right lower extremity ORIF associated infection s/p I and D on 03/31/23 and 04/02/23 ( cx MRSE and enterobacter claoacae), removal of the hardware by Dr. Curtiss Dowdy 06/21/2023,  chronic infection of the ankle discharged hospital 07/22/2023 to continue ertapenem  and daptomycin  per ID recommendation, chronic hypotension, essential hypertension, COPD, CAD status post CABG, GERD, carotid stenosis stenosis, peripheral vascular disease, chronic venous stasis bilateral lower extremities, hypothyroidism, depression, DM type II, OSA on CPAP, pulmonary hypertension, paroxysmal atrial fibrillation, prolonged QTc, and BPH.   Patient has been seen in fisheries clinic today but by Dr.Vu. Right foot doing well no pain; small dimple of an area that is scabbing but no drainage and recommended IV antibiotic until 07/31/2023.  Per ID and Dr. Curtiss Dowdy all hardware not out, rod is in and no immediate plans for removal.    In the ID clinic patient found hypotensive with associated dizziness which is why patient has been sent to ED for evaluation for hypotension and dizziness. Also patient reported that outpatient workup showed low hemoglobin 5.2.  He is also on Eliquis  and Plavix .  Patient does not have any evidence of bleeding.       Assessment and Plan: QTc prolongation  Further workup revealed include EKG Normal sinus rhythm heart rate 69, prolonged QTc 603. Cardiology on board and case discussed Follow-up TSH Continue monitoring electrolytes and correct as needed I discussed with infectious disease and current antibiotics will not cause QT prolongation and so we will continue Continue to hold Cymbalta  as well as Zoloft  Cardiology dosing amiodarone  appropriately -  Need to avoid further QTc prolonging medications include Zofran , Compazine, Atarax, Ativan, Zoloft , Cymbalta , from patient's home medication list.     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   Chronic hypotension-resolved Essential hypertension -During last hospitalization patient found to be hypotensive being started on midodrine  3 times and fludrocortisone .  Also Flomax  was discontinued due to hypotension..  -Presently blood pressure is elevated.  Holding midodrine  fludrocortisone . -Starting IV hydralazine  as needed for blood pressure management.     Paroxysmal atrial fibrillation - At home patient is on amiodarone  200 mg twice daily.  In the setting of prolonged QTc holding amiodarone  and consulted cardiology for amiodarone  dosing. -Continue Eliquis  5 mg twice daily Continue Coreg  3.125 mg twice daily.     History of CAD s/p CABG - Continue Coreg , Lipitor and Eliquis      History of PAD Continue Lipitor and Eliquis    OSA on CPAP Continue CPAP at bedtime.   Chronic normocytic anemia -CBC showing stable H&H 10.2 and 29.  Normal platelet and WBC count.  Patient reported no evidence of GI bleed.   Non-insulin -dependent DM type II History of DM type II diet controlled.  A1c within normal range.  Not currently on insulin  at home.     Generalized anxiety disorder History of depression Continue  to hold Cymbalta  and Zoloft  in the setting of prolonged QTc. -Also need to avoid benzodiazepine Atarax in the setting of prolonged QTc.   BPH Holding Prozac in the setting  of QTc prolongation   History of COPD - Stable.  Continue to check pulse ox. - Continue ipratropium as needed for wheezing shortness of breath.  Will avoid albuterol  in the setting of prolonged QTc.     DVT prophylaxis:  Eliquis   Code Status:  Full Code  Diet: Heart healthy carb modified diet   Disposition Plan: Hopefully home after clearance by cardiology  Consults: Cardiology and infectious disease   Subjective:  Patient seen and examined at bedside this morning Denies chest pain nausea vomiting abdominal pain Have been seen by cardiology as well as infectious disease ID agrees with resumption of antibiotics to complete remaining duration of therapy as these do not affect QT interval  Physical Exam Vitals and nursing note reviewed.  Constitutional:      General: He is not in acute distress.    Appearance: He is obese. He is not ill-appearing.  HENT:     Mouth/Throat:     Mouth: Mucous membranes are moist.  Eyes:     Pupils: Pupils are equal, round, and reactive to light.  Cardiovascular:     Rate and Rhythm: Normal rate and regular rhythm.  Pulmonary:     Effort: Pulmonary effort is normal.     Breath sounds: Normal breath sounds.  Abdominal:     Palpations: Abdomen is soft.  Musculoskeletal:     Cervical back: Neck supple.  Neurological:     Mental Status: He is alert and oriented to person, place, and time.  Psychiatric:        Mood and Affect: Mood normal.    Vitals:   07/30/23 1200 07/30/23 1300 07/30/23 1315 07/30/23 1400  BP: (!) 145/73 (!) 178/73  (!) 144/106  Pulse: 67 64  67  Resp: 14 17  20   Temp:   97.8 F (36.6 C)   TempSrc:   Oral   SpO2: 100% 100%  100%  Weight:      Height:        Data Reviewed:    Latest Ref Rng & Units 07/30/2023    4:14 AM 07/29/2023    8:43 PM 07/21/2023    2:49 AM  CBC  WBC 4.0 - 10.5 K/uL 6.4  6.7  6.8   Hemoglobin 13.0 - 17.0 g/dL 28.4  13.2  44.0   Hematocrit 39.0 - 52.0 % 31.0  29.7  30.9   Platelets 150 -  400 K/uL 239  245  228        Latest Ref Rng & Units 07/30/2023    4:14 AM 07/29/2023    8:43 PM 07/22/2023    6:16 AM  BMP  Glucose 70 - 99 mg/dL 102  94  725   BUN 8 - 23 mg/dL 22  22  11    Creatinine 0.61 - 1.24 mg/dL 3.66  4.40  3.47   Sodium 135 - 145 mmol/L 138  139  141   Potassium 3.5 - 5.1 mmol/L 3.0  3.3  3.5   Chloride 98 - 111 mmol/L 107  108  110   CO2 22 - 32 mmol/L 20  21  24    Calcium  8.9 - 10.3 mg/dL 8.3  7.9  8.2       Family Communication: Present at bedside  Disposition: Status is: Inpatient   Time spent: 63  minutes  Author: Ezzard Holms, MD 07/30/2023 2:58 PM  For on call review [email or web address].

## 2023-07-30 NOTE — Consult Note (Signed)
 Cardiology Consultation   Patient ID: Harold [name] MRN: [id number]; DOB: 08-01-1955  Admit date: 07/29/2023 Date of Consult: 07/30/2023  PCP:  Tita Form, MD   [facility] Providers Cardiologist:  Randene Bustard, MD  Electrophysiologist:  Efraim Grange, MD  PV Cardiologist:  Antionette Kirks, MD       Patient Profile: Harold [name] is a 68 y.o. male with a hx of CAD, PAD, HTN, HLD, DM2, CKD, A-fib, OSA and orthostatic hypotension who is being seen 07/30/2023 for the evaluation of prolonged QT at the request of Dr. Urban Garden.  History of Present Illness: Harold [name] was sent to the ED from infectious disease clinic due to hypotension, dizziness and concern for anemia.  On admission to the ED the patient had an ECG performed which revealed a prolonged QTc.  Of note the patient is currently being treated for Enterobacter cloacae bacteremia 2/2 a complicated right ankle wound.  Initially he was treated with a prolonged course of p.o. ciprofloxacin  which started in April.  In May he was admitted to the hospital with ongoing ankle drainage and he was ultimately discharged on a 6-week course of IV daptomycin  and ertapenem .  His antibiotic course is currently scheduled to end on 6/12.  He currently takes amiodarone  200 mg twice daily as well for atrial fibrillation rhythm control.  The patient has no complaints currently.  He denies palpitations, chest pain, syncope.   Past Medical History:  Diagnosis Date   Anemia    Anginal pain (HCC) 04/2013   Cardiac cath showed patent stents with distal LAD, circumflex-OM and RCA disease in small vessels.   Anxiety    Atrial fibrillation (HCC) 12/2021   CAD S/P percutaneous coronary angioplasty 2003, 04/2012   status post PCI to LAD, circumflex-OM 2, RCA   Carotid artery occlusion    Chronic renal insufficiency, stage II (mild)    Chronic venous insufficiency    varicosities, no reflux; dopplers 04/14/12- valvular  insufficiency in the R and L GSV   Complication of anesthesia    COPD, mild (HCC)    Depression    situaltional    Diabetes mellitus    Diabetic neuropathy (HCC) 08/24/2019   Dyslipidemia associated with type 2 diabetes mellitus (HCC)    Fall at home, initial encounter 01/02/2023   GERD (gastroesophageal reflux disease)    History of cardioversion 12/2021   at [facility]   HTN (hypertension)    Hypothyroidism    Neuromuscular disorder (HCC)    neuropathy in feet   Neuropathy    notably improved following PCI with improved cardiac function   Obesity    Obesity, Class II, BMI 35.0-39.9, with comorbidity (see actual BMI)    BMI 39; wgt loss efforts in place; seeing Dietician   Open ankle fracture 01/02/2023   Orthostatic hypotension    PONV (postoperative nausea and vomiting)    [place]   Pulmonary hypertension (HCC) 04/2012   PA pressure   Sleep apnea    uses nightly   Vitamin D  deficiency    per facility notes    Past Surgical History:  Procedure Laterality Date   ABDOMINAL AORTAGRAM N/A 11/15/2011   Procedure: ABDOMINAL Tommi Fraise;  Surgeon: Richrd Char, MD;  Location: [facility] CATH LAB;  Service: Cardiovascular;  [name];   ABDOMINAL AORTOGRAM W/LOWER EXTREMITY N/A 10/13/2019   Procedure: ABDOMINAL AORTOGRAM W/LOWER EXTREMITY;  Surgeon: Wenona Hamilton, MD;  Location: [facility] INVASIVE CV LAB;  Service: CV: Non-obst Ao-Iliac. R  SFA-PopA patent with significant calcific stenosis of TP trunk-prox PTA (dominant) & CTO ATA  -> R PTA-TP PTCA   ABDOMINAL AORTOGRAM W/LOWER EXTREMITY N/A 11/06/2022   Procedure: ABDOMINAL AORTOGRAM W/LOWER EXTREMITY;  Surgeon: Wenona Hamilton, MD;  [facility]: [facility]AS[facility] CV LAB;  Service: Cardiovascular;  [name];   ABDOMINAL AORTOGRAM W/LOWER EXTREMITY Right 04/01/2023   Procedure: ABDOMINAL AORTOGRAM W/LOWER EXTREMITY;  Surgeon: Margherita Shell, MD;  [facility]: [facility] INVAS[facility] CV LAB;  Service: Cardiovascular;  Laterality: Right;    ABIs  04/27/2012   mild bilateral arterial insufficiency   ANKLE FUSION Right 05/15/2023   Procedure: ARTHRODESIS ANKLE WITH HINDFOOT FUSION NAIL;  Surgeon: Laneta Pintos, MD;  [name];  Service: Orthopedics;  Laterality: Right;   APPLICATION OF WOUND VAC Right 03/31/2023   Procedure: APPLICATION OF WOUND VAC;  Surgeon: Laneta Pintos, MD;  [name];  Service: Orthopedics;  Laterality: Right;   BACK SURGERY  2005 x1   X2-2010   BUNIONECTOMY Right 11/11/2013   Procedure: RIGHT FOOT SILVER BUNIONECTOMY;  Surgeon: Amada Backer, MD;  Location: [facility];  Service: Orthopedics;  Laterality: Right;   CARDIAC CATHETERIZATION  12/11/2001   significant 3V CAD, normal LV function   CARDIOVERSION N/A 03/13/2022   Procedure: CARDIOVERSION;  Surgeon: Wendie Hamburg, MD;  Location: [facility] ENDOSCOPY;  Service: Cardiovascular;  [name];   CARDIOVERSION N/A 07/25/2022   Procedure: CARDIOVERSION;  Surgeon: Hugh Madura, MD;  [facility] CV LAB;  Service: Cardiovascular;  [name];   Carotid Doppler  04/27/2012   right internal carotid: Elevated velocities but no evidence of plaque. Left internal carotid 40-59%   CAROTID ENDARTERECTOMY  2005   Right; recent carotid Dopplers notes elevated velocities.    colonscopy     CORONARY ANGIOPLASTY  12/21/2001   PTCA of the distal and mid AV groove circ, unsuccessful PTCA of second OM total occlusion, unsuccessful PTCA of the apical LAD total occlusion   CORONARY ANGIOPLASTY WITH STENT PLACEMENT  04/29/2012   PCI to 3 RCA lesions, Promus Premiere 2.63mmx8mm distally, mid was 2.57mx28mm and proximally 2.75x81mm, EF 55-60%   CORONARY STENT PLACEMENT  04/28/2012   PCI to LAD (3x23mm Xience DES postdilated to 3.25) and circ prox and mid (2 overlappinmg 2.73mmx12mmXience DES postdilated to 2.95mm)   DOPPLER ECHOCARDIOGRAPHY  04/28/2012   poor quality study: EF estimated 60-65%; unable to assess diastolic  function (previously noted to have diastolic dysfunction); severely dilated left atrium and mild right atrium; dilated IVC consistent with increased central venous pressure.Aaron Aas   EXTERNAL FIXATION LEG Right 05/12/2023   Procedure: EXTERNAL FIXATION, LOWER EXTREMITY;  Surgeon: Laneta Pintos, MD;  [name];  Service: Orthopedics;  Laterality: Right;   HARDWARE REMOVAL Right 03/31/2023   Procedure: HARDWARE REMOVAL RIGHT ANKLE;  Surgeon: Laneta Pintos, MD;  [name];  Service: Orthopedics;  Laterality: Right;   HARDWARE REMOVAL Right 01/28/2023   Procedure: REMOVAL OF HARDWARE RIGHT ANKLE;  Surgeon: Hardy Lia, MD;  [name];  Service: Orthopedics;  Laterality: Right;   HARDWARE REMOVAL Right 06/21/2023   Procedure: REMOVAL, HARDWARE;  Surgeon: Laneta Pintos, MD;  [name];  Service: Orthopedics;  Laterality: Right;   HERNIA REPAIR     I & D EXTREMITY Right 01/02/2023   Procedure: IRRIGATION AND DEBRIDEMENT RIGHT ANKLE;  Surgeon: Hardy Lia, MD;  [name];  Service: Orthopedics;  Laterality: Right;   I & D EXTREMITY Right 04/02/2023  Procedure: DEBRIDEMENT RIGHT ANKLE;  Surgeon: Timothy Ford, MD;  Location: [facility] OR;  Service: Orthopedics;  Laterality: Right;   IR FLUORO GUIDE CV LINE LEFT  04/22/2023   IR FLUORO GUIDE CV LINE RIGHT  04/07/2023   IR PATIENT EVAL TECH 0-60 MINS  04/07/2023   IR RADIOLOGIST EVAL & MGMT  04/08/2023   IR REMOVAL TUN CV CATH W/O FL  05/05/2023   IR US  GUIDE VASC ACCESS LEFT  04/22/2023   IR US  GUIDE VASC ACCESS RIGHT  04/07/2023   LEFT AND RIGHT HEART CATHETERIZATION WITH CORONARY ANGIOGRAM N/A 04/27/2012   Procedure: LEFT AND RIGHT HEART CATHETERIZATION WITH CORONARY ANGIOGRAM;  Surgeon: Arleen Lacer, MD;  Location: [facility] CATH LAB;  Service: Cardiovascular;  [name];   LEFT AND RIGHT HEART CATHETERIZATION WITH CORONARY ANGIOGRAM N/A 05/14/2013   Procedure: LEFT AND RIGHT HEART CATHETERIZATION WITH CORONARY ANGIOGRAM;   Surgeon: Millicent Ally, MD;  [name]; PCWP ;; [name], [name], Patent RCA ostial mid and distal stents - 70% distal runoff Dz   LUMBAR LAMINECTOMY/DECOMPRESSION MICRODISCECTOMY  01/07/2012   Procedure: LUMBAR LAMINECTOMY/DECOMPRESSION MICRODISCECTOMY 1 LEVEL;  Surgeon: Pasty Bongo, MD;  [name];  Service: Neurosurgery;  Laterality: Bilateral;   Lumbar Three-Four Decompression   LUMBAR LAMINECTOMY/DECOMPRESSION MICRODISCECTOMY N/A 07/26/2020   Procedure: Lumbar two-three Laminectomy/Foraminotomy;  Surgeon: Audie Bleacher, MD;  [name];  Service: Neurosurgery;  [name];   ORIF ANKLE FRACTURE Right 01/02/2023   Procedure: OPEN REDUCTION INTERNAL FIXATION (ORIF)RIGHT ANKLE FRACTURE;  Surgeon: Hardy Lia, MD;  [name];  Service: Orthopedics;  Laterality: Right;   PERCUTANEOUS CORONARY STENT INTERVENTION (PCI-S) N/A 04/28/2012   Procedure: PERCUTANEOUS CORONARY STENT INTERVENTION (PCI-S);  Surgeon: Millicent Ally, MD;  Location: [facility] CATH LAB;  Service: Cardiovascular;  [name];   PERCUTANEOUS CORONARY STENT INTERVENTION (PCI-S) N/A 04/29/2012   Procedure: PERCUTANEOUS CORONARY STENT INTERVENTION (PCI-S);  Surgeon: Arleen Lacer, MD;  Location: [facility] CATH LAB;  Service: Cardiovascular;  [name];   PERIPHERAL VASCULAR ATHERECTOMY  11/06/2022   Procedure: PERIPHERAL VASCULAR ATHERECTOMY;  Surgeon: Wenona Hamilton, MD;  [facility];  Service: Cardiovascular;;   PERIPHERAL VASCULAR BALLOON ANGIOPLASTY Right 10/13/2019   Procedure: PERIPHERAL VASCULAR BALLOON ANGIOPLASTY;  Surgeon: Wenona Hamilton, MD;  [facility];  Service: Cardiovascular;  Laterality: Right;  PTCA of R TP Trunk into PTA (Drug-coated) => Follow-up ABIs 0.9 on the right and 0.99 on the left.    PERIPHERAL VASCULAR BALLOON ANGIOPLASTY Right 04/01/2023   Procedure: PERIPHERAL VASCULAR BALLOON ANGIOPLASTY;  Surgeon: Margherita Shell, MD;  [facility] LAB;  Service: Cardiovascular;  Laterality: Right;   PERIPHERAL VASCULAR INTERVENTION Right 04/01/2023   Procedure: PERIPHERAL VASCULAR INTERVENTION;  Surgeon: Margherita Shell, MD;  [facility] CV LAB;  Service: Cardiovascular;  Laterality: Right;   SPINE SURGERY     UMBILICAL HERNIA REPAIR  2009   steel mesh insert     Home Medications:  Prior to Admission medications   Medication Sig Start Date End Date Taking? Authorizing Provider  acetaminophen  (TYLENOL ) 500 MG tablet Take 1 tablet (500 mg total) by mouth every 6 (six) hours as needed for mild pain (pain score 1-3). 01/07/23   Hongalgi, Anand D, MD  allopurinol  (ZYLOPRIM ) 100 MG tablet Take 0.5  tablets (50 mg total) by mouth daily. 05/10/23   Modena Andes, MD  Amino Acids-Protein Hydrolys (PRO-STAT PO) Take 30 mLs by mouth in the morning and at bedtime.    [provider]  amiodarone  (PACERONE ) 200 MG tablet Take 1 tablet (200 mg total) by mouth 2 (two) times daily. 05/10/23   Modena Andes, MD  apixaban  (ELIQUIS ) 5 MG TABS tablet TAKE 1 TABLET BY MOUTH TWICE A DAY 02/17/23   Arleen Lacer, MD  atorvastatin  (LIPITOR) 40 MG tablet Take 1 tablet (40 mg total) by mouth daily. Patient taking differently: Take 40 mg by mouth at bedtime. 05/10/23 07/17/23  Modena Andes, MD  carvedilol  (COREG ) 3.125 MG tablet Take 3.125 mg by mouth 2 (two) times daily. 07/20/23   [provider]  clopidogrel  (PLAVIX ) 75 MG tablet Take 75 mg by mouth every morning. 02/04/23   [provider]  Continuous Glucose Sensor (FREESTYLE LIBRE 3 PLUS SENSOR) MISC Inject 1 Device into the skin every 14 (fourteen) days. 06/11/23   [provider]  daptomycin  (CUBICIN ) IVPB Inject 800 mg into the vein daily. Indication:  Hardware associated ankle osteomyelitis  First  Dose: Yes Last Day of Therapy:  08/02/23 Labs - Once weekly:  CBC/D, BMP, and CPK Labs - Once weekly: ESR and CRP 06/28/23 08/07/23  Doroteo Gasmen, MD  docusate sodium  (COLACE) 100 MG capsule Take 1 capsule (100 mg total) by mouth 2 (two) times daily. 06/28/23   Doroteo Gasmen, MD  DULoxetine  (CYMBALTA ) 20 MG capsule Take 1 capsule (20 mg total) by mouth daily. 07/22/23   Wouk, Haynes Lips, MD  ertapenem  (INVANZ ) IVPB Inject 1 g into the vein daily. Indication:  Hardware associated ankle osteomyelitis  First Dose: Yes Last Day of Therapy:  08/02/23 Labs - Once weekly:  CBC/D and BMP, Labs - Once weekly: ESR and CRP 06/28/23 08/07/23  Doroteo Gasmen, MD  ezetimibe  (ZETIA ) 10 MG tablet Take 10 mg by mouth at bedtime. 05/09/22   [provider]  ferrous sulfate  325 (65 FE) MG tablet Take 325 mg by mouth 3 (three) times a week. Monday, Wednesday, Friday 06/08/23   [provider]  finasteride (PROSCAR) 5 MG tablet Take 5 mg by mouth daily.    [provider]  fludrocortisone  (FLORINEF ) 0.1 MG tablet Take 1 tablet (0.1 mg total) by mouth daily. 07/15/23   Wenona Hamilton, MD  furosemide  (LASIX ) 40 MG tablet Take 40 mg by mouth daily.    [provider]  gabapentin  (NEURONTIN ) 300 MG capsule Take 1 capsule (300 mg total) by mouth at bedtime. 05/10/23   Modena Andes, MD  insulin  glargine (LANTUS  SOLOSTAR) 100 UNIT/ML Solostar Pen Inject 5 Units into the skin at bedtime. 07/22/23   Wouk, Haynes Lips, MD  insulin  lispro (HUMALOG) 100 UNIT/ML injection Inject 0-15 Units into the skin in the morning, at noon, in the evening, and at bedtime. Per sliding scale: 0-70= 0 units if <70 implement hypoglycemic protocol & notify MD/NP 71-150= 0 units 151-200=3 units 201-250= 6 units 251-300= 9 units 301-350= 12 units 351-400= 15 units 401-999 if CBG>400. Notify MD/ NP,    [provider]  levothyroxine  (SYNTHROID , LEVOTHROID) 88 MCG tablet Take 88 mcg by  mouth daily before breakfast.    [provider]  magnesium  oxide (MAG-OX) 400 MG tablet Take 1 tablet by mouth daily. 06/08/23   [provider]  methocarbamol  (ROBAXIN ) 500 MG tablet Take 1 tablet (500 mg total) by mouth every  6 (six) hours as needed for muscle spasms. 05/20/23   Versie Gores, PA-C  midodrine  (PROAMATINE ) 10 MG tablet Take 1 tablet (10 mg total) by mouth 3 (three) times daily with meals. Take one hour prior to physical therapy 07/15/23   Wenona Hamilton, MD  Multiple Vitamin (MULTIVITAMIN WITH MINERALS) TABS tablet Take 1 tablet by mouth daily. 01/08/23   Hongalgi, Anand D, MD  nitroGLYCERIN  (NITROSTAT ) 0.4 MG SL tablet PLACE 1 TAB UNDER THE TONGUE EVERY 5 MINUTES AS NEEDED FOR CHEST PAIN (SEVERE PRESSURE OR TIGHTNESS) Patient taking differently: Place 0.4 mg under the tongue every 5 (five) minutes as needed for chest pain. PLACE 1 TAB UNDER THE TONGUE EVERY 5 MINUTES AS NEEDED FOR CHEST PAIN (SEVERE PRESSURE OR TIGHTNESS) 02/22/19   Arleen Lacer, MD  nutrition supplement, JUVEN, (JUVEN) PACK Take 1 packet by mouth 2 (two) times daily between meals. 06/28/23   Doroteo Gasmen, MD  Nystatin (GERHARDT'S BUTT CREAM) CREA Apply 1 Application topically 2 (two) times daily. 06/28/23   Fonnie Iba I, MD  pantoprazole  (PROTONIX ) 40 MG tablet TAKE 1 TABLET BY MOUTH EVERY DAY 12/13/22   Dunn, Dayna N, PA-C  polyethylene glycol (MIRALAX  / GLYCOLAX ) 17 g packet Take 17 g by mouth daily as needed for mild constipation. 06/28/23   Doroteo Gasmen, MD  sertraline  (ZOLOFT ) 100 MG tablet Take 1 tablet (100 mg total) by mouth daily. 07/22/23   Wouk, Haynes Lips, MD  tirzepatide  (MOUNJARO ) 15 MG/0.5ML Pen Inject 15 mg into the skin once a week. Patient taking differently: Inject 15 mg into the skin once a week. Sunday 12/30/22     Vitamin D , Ergocalciferol , (DRISDOL ) 1.25 MG (50000 UNIT) CAPS capsule Take 1 capsule (50,000 Units total) by mouth every 7 (seven)  days. Patient taking differently: Take 50,000 Units by mouth every 7 (seven) days. Sundays 05/27/23   Haydee Lipa, MD    Scheduled Meds:  allopurinol   50 mg Oral Daily   apixaban   5 mg Oral BID   atorvastatin   40 mg Oral Daily   carvedilol   3.125 mg Oral BID   clopidogrel   75 mg Oral q morning   ezetimibe   10 mg Oral QHS   ferrous sulfate   325 mg Oral Once per day on Monday Wednesday Friday   finasteride  5 mg Oral Daily   gabapentin   300 mg Oral QHS   Gerhardt's butt cream  1 Application Topical BID   levothyroxine   88 mcg Oral QAC breakfast   pantoprazole   40 mg Oral Daily   sodium chloride  flush  3 mL Intravenous Q12H   sodium chloride  flush  3 mL Intravenous Q12H   Continuous Infusions:  sodium chloride      lactated ringers  100 mL/hr at 07/30/23 0120   PRN Meds: sodium chloride , acetaminophen  **OR** acetaminophen , hydrALAZINE , ipratropium, methocarbamol , senna-docusate, sodium chloride  flush, trimethobenzamide   Allergies:    Allergies  Allergen Reactions   Beta-Lactamase Inhibitors (Beta-Lactam) Hives, Itching and Rash    AX - penicillin     Ancef  given 9/24 with no obvious reaction   Metformin  And Related Diarrhea and Nausea Only   Other Nausea And Vomiting    General Anesthesia - sometimes causes nausea and vomiting * OK to use scopolamine  patch     Sulfa Antibiotics Hives and Other (See Comments)    Bactrim   Wellbutrin [Bupropion] Other (See Comments)    Mood Changes   Tetracyclines & Related Hives and Rash    doxycycline   Social History:   Social History   Socioeconomic History   Marital status: Married    Spouse name: Not on file   Number of children: 0   Years of education: hs   Highest education level: Not on file  Occupational History   Occupation: Surveyor, quantity: replacement limited  Tobacco Use   Smoking status: Former    Current packs/day: 0.00    Average packs/day: 1 pack/day for 42.0 years (42.0 ttl pk-yrs)     Types: Cigarettes    Start date: 03/22/1961    Quit date: 03/23/2003    Years since quitting: 20.3   Smokeless tobacco: Never   Tobacco comments:    Former smoker 02/19/22  Vaping Use   Vaping status: Never Used  Substance and Sexual Activity   Alcohol use: No    Alcohol/week: 0.0 standard drinks of alcohol   Drug use: No   Sexual activity: Not on file  Other Topics Concern   Not on file  Social History Narrative   He and his partner [name] have now officially gotten married on 05/26/2013.  [name] is also a patient of our clinic.  He is now initially hyphenated his last name.   He has now  retiredfrom his long-term employment at [employer]. ->  As of September 2020   He does not smoke, he quit in 2009.  He does not drink alcohol.   He was adopted and no family history is known.    Social Drivers of Corporate investment banker Strain: Not on file  Food Insecurity: No Food Insecurity (07/17/2023)   Hunger Vital Sign    Worried About Running Out of Food in the Last Year: Never true    Ran Out of Food in the Last Year: Never true  Transportation Needs: No Transportation Needs (07/17/2023)   PRAPARE - Administrator, Civil Service (Medical): No    Lack of Transportation (Non-Medical): No  Physical Activity: Not on file  Stress: Not on file  Social Connections: Moderately Isolated (07/17/2023)   Social Connection and Isolation Panel [NHANES]    Frequency of Communication with Friends and Family: More than three times a week    Frequency of Social Gatherings with Friends and Family: Three times a week    Attends Religious Services: Never    Active Member of Clubs or Organizations: No    Attends Banker Meetings: Never    Marital Status: Married  Catering manager Violence: Not At Risk (07/17/2023)   Humiliation, Afraid, Rape, and Kick questionnaire    Fear of Current or Ex-Partner: No    Emotionally Abused: No    Physically Abused: No    Sexually  Abused: No    Family History:    Family History  Adopted: Yes  Family history unknown: Yes     ROS:  Please see the history of present illness.  All other ROS reviewed and negative.     Physical Exam/Data: Vitals:   07/30/23 0215 07/30/23 0245 07/30/23 0303 07/30/23 0315  BP: (!) 176/70 (!) 179/70 (!) 175/71 (!) 166/68  Pulse: 70 70 71 68  Resp: 16 14  16   Temp:      TempSrc:      SpO2: 99% 99%  98%  Weight:      Height:       No intake or output data in the 24 hours ending 07/30/23 0333    07/29/2023    8:29 PM  07/22/2023    1:27 AM 07/21/2023    3:51 AM  Last 3 Weights  Weight (lbs) 224 lb 13.9 oz 226 lb 10.1 oz 225 lb 5 oz  Weight (kg) 102 kg 102.8 kg 102.2 kg     Body mass index is 29.67 kg/m.  General:  Well nourished, well developed, in no acute distress HEENT: normal Neck: no JVD Vascular: No carotid bruits Cardiac:  normal S1, S2; RRR; no murmur, rubs or gallops Lungs:  clear to auscultation bilaterally, no wheezing, rhonchi or rales  Abd: soft, nontender, nondistended Ext: no edema Musculoskeletal: Grossly normal strength throughout Skin: warm and dry  Neuro: Grossly normal Psych:  Normal affect   EKG:  The EKG was personally reviewed and demonstrates:  NSR, prolonged QTc      Telemetry:  Telemetry was personally reviewed and demonstrates:  NSR  Relevant CV Studies:  TTE 04/16/23:  IMPRESSIONS     1. Left ventricular ejection fraction, by estimation, is 55 to 60%. The  left ventricle has normal function. The left ventricle demonstrates  regional wall motion abnormalities with basal inferior hypokinesis. The  left ventricular internal cavity size was   mildly dilated. Left ventricular diastolic parameters are consistent with  Grade II diastolic dysfunction (pseudonormalization).   2. Right ventricular systolic function is normal. The right ventricular  size is mildly enlarged. There is mildly elevated pulmonary artery  systolic pressure. The  estimated right ventricular systolic pressure is  38.3 mmHg.   3. Left atrial size was moderately dilated.   4. Right atrial size was mildly dilated.   5. The mitral valve is normal in structure. Trivial mitral valve  regurgitation. No evidence of mitral stenosis.   6. The aortic valve is tricuspid. There is mild calcification of the  aortic valve. Aortic valve regurgitation is not visualized. No aortic  stenosis is present.   7. The inferior vena cava is normal in size with greater than 50%  respiratory variability, suggesting right atrial pressure of 3 mmHg.    Laboratory Data: High Sensitivity Troponin:  No results for input(s): TROPONINIHS in the last 720 hours.   Chemistry Recent Labs  Lab 07/29/23 2043  NA 139  K 3.3*  CL 108  CO2 21*  GLUCOSE 94  BUN 22  CREATININE 2.34*  CALCIUM  7.9*  GFRNONAA 29*  ANIONGAP 10    Recent Labs  Lab 07/29/23 2043  PROT 5.1*  ALBUMIN  1.9*  AST 39  ALT 26  ALKPHOS 118  BILITOT 0.8   Lipids No results for input(s): CHOL, TRIG, HDL, LABVLDL, LDLCALC, CHOLHDL in the last 168 hours.  Hematology Recent Labs  Lab 07/29/23 2043  WBC 6.7  RBC 3.41*  HGB 10.2*  HCT 29.7*  MCV 87.1  MCH 29.9  MCHC 34.3  RDW 16.4*  PLT 245   Thyroid   Recent Labs  Lab 07/29/23 2043  TSH 8.948*    BNPNo results for input(s): BNP, PROBNP in the last 168 hours.  DDimer No results for input(s): DDIMER in the last 168 hours.  Radiology/Studies:  No results found.   Assessment and Plan: Harold [name] is a 68 y.o. male with a hx of CAD, PAD, HTN, HLD, DM2, CKD, A-fib, OSA and orthostatic hypotension who is being seen 07/30/2023 for the evaluation of prolonged QT at the request of Dr. Urban Garden.  #Prolonged Qtc #Persistent Afib :: Patient incidentally found to have a prolonged QTc on ED admission.  Patient is asymptomatic from a cardiac standpoint.  Looking  back at his old ECGs, his QTc began to prolong for the first  time back in April.  This corresponds to the initiation of his antibiotic treatment for his Enterobacter bacteremia with ciprofloxacin  and then subsequently IV daptomycin  and ertapenem .  His prolonged QTc is likely drug-induced due to combination of his antibiotics along with his SSRI and amiodarone .  Thankfully he is scheduled to stop antibiotics in 1 day.  Also, he has been taking amiodarone  200 mg twice daily; however, it appears that he is supposed to be on once daily.  For now recommend avoiding all nonessential QTc prolonging medications and discussing with ID the early termination of his IV antibiotics by 1 day.  Please check daily ECGs while inpatient. - Hold amiodarone  for now then transition to 200 mg once daily once restarted - Avoid QTc prolonging medications - Discussed discontinuing antibiotics with ID if appropriate - Daily ECGs   Risk Assessment/Risk Scores:         CHA2DS2-VASc Score = 4   This indicates a 4.8% annual risk of stroke. The patient's score is based upon: CHF History: 0 HTN History: 1 Diabetes History: 1 Stroke History: 0 Vascular Disease History: 1 Age Score: 1 Gender Score: 0        For questions or updates, please contact  [facility] Please consult [email or web address] for contact info under    Signed, Christena Covert, MD  07/30/2023 3:33 AM

## 2023-07-30 NOTE — Plan of Care (Signed)

## 2023-07-30 NOTE — H&P (Addendum)
 History and Physical    Harold [name]:[id number] DOB: October 11, 1955 DOA: 07/29/2023  PCP: Amin, Saad, MD   Patient coming from: Home   Chief Complaint:  Chief Complaint  Patient presents with   Abnormal Labs    ED TRIAGE note:  Patient BIB [facility] from [facility] w/ routine labwork that showed a hemoglobin of 5.2. At this time patient has no complaints. Denies bleeding from anywhere that he knows of. Denies shortness of breath, chest pain. Is on eliquis  and plavix . Has PICC line in R upper arm for antibiotic treatment from an ankle fx back in February that got infected.     HPI:  Harold [name] is a 68 y.o. male with medical history significant of  Right lower extremity ORIF associated infection s/p I and D on 03/31/23 and 04/02/23 ( cx MRSE and enterobacter claoacae), removal of the hardware by Dr. Curtiss Dowdy 06/21/2023,  chronic infection of the ankle discharged hospital 07/22/2023 to continue ertapenem  and daptomycin  per ID recommendation, chronic hypotension, essential hypertension, COPD, CAD status post CABG, GERD, carotid stenosis stenosis, peripheral vascular disease, chronic venous stasis bilateral lower extremities, hypothyroidism, depression, DM type II, OSA on CPAP, pulmonary hypertension, paroxysmal atrial fibrillation, prolonged QTc, and BPH.  Patient has been seen in fisheries clinic today but by Dr.Vu. Right foot doing well no pain; small dimple of an area that is scabbing but no drainage and recommended IV antibiotic until 07/31/2023.  Per ID and Dr. Curtiss Dowdy all hardware not out, rod is in and no immediate plans for removal.   In the ID clinic patient found hypotensive with associated dizziness which is why patient has been sent to ED for evaluation for hypotension and dizziness. Also patient reported that outpatient workup showed low hemoglobin 5.2.  He is also on Eliquis  and Plavix .  Patient does not have any evidence of bleeding.  During my evaluation at the  bedside patient is resting comfortably.  He denies any chest pain, palpitation, shortness of breath and lower extremity pain.  Patient denies any history of GI bleed, hematemesis, melena and hematochezia.  Patient does not have any complaint at this time.   ED Course:  At presentation to ED patient is hypertensive otherwise hemodynamically stable. CMP showed low potassium 3.3, elevated creatinine 2.34 and GFR 29. CBC showing hemoglobin 10.2 (baseline hemoglobin around 9-10, hematocrit 29.7, WBC count 6.7 and platelet count 245.  EKG showed normal sinus rhythm, heart rate 69 and borderline repolarization abnormality.  And QTc prolongation 603 msec.  In the ED patient has been given mag sulfate 2 g, oral potassium and calcium  gluconate 1 g.  It is not clear however the patient found to have low hemoglobin however in the ED repeat lab work showed normal hemoglobin level and which is at baseline.  Hospitalist has been consulted for admission for acute kidney injury, prolonged QTc.  Hypokalemia and hypocalcemia.  I have discussed with with on-call cardiology Dr.Sullivan. Per cardiology concerned  for prolonged QTc in the setting of combination of it ertapenem  and daptomycin .  Per chart review patient has been on amiodarone  200 mg since 2023 however he started developing prolonged QTc after being on these 2 antibiotic since May 2025.  Also patient is on Zoloft  and Cymbalta  which is provoking prolonged QTc as well.  Cardiology probably will cut down the dose of amiodarone  to once daily and recommended to reach out to infectious disease to find out how long the patient actually need the antibiotic or if the antibiotic  can be changed to some other alternative.    Significant labs in the ED: Lab Orders         CBC with Differential         Comprehensive metabolic panel         Comprehensive metabolic panel         CBC         TSH         Magnesium          Urinalysis, Routine w reflex microscopic  -Urine, Clean Catch         Sodium, urine, random         Creatinine, urine, random       Review of Systems:  Review of Systems  Constitutional:  Negative for chills, fever, malaise/fatigue and weight loss.  Respiratory:  Negative for cough, sputum production and shortness of breath.   Cardiovascular:  Negative for chest pain, palpitations, claudication and leg swelling.  Gastrointestinal:  Negative for abdominal pain, blood in stool, constipation, diarrhea, heartburn, melena, nausea and vomiting.  Genitourinary:  Negative for dysuria and urgency.  Musculoskeletal:  Negative for falls, joint pain, myalgias and neck pain.  Neurological:  Negative for dizziness and headaches.  Psychiatric/Behavioral:  The patient is not nervous/anxious.   All other systems reviewed and are negative.   Past Medical History:  Diagnosis Date   Anemia    Anginal pain (HCC) 04/2013   Cardiac cath showed patent stents with distal LAD, circumflex-OM and RCA disease in small vessels.   Anxiety    Atrial fibrillation (HCC) 12/2021   CAD S/P percutaneous coronary angioplasty 2003, 04/2012   status post PCI to LAD, circumflex-OM 2, RCA   Carotid artery occlusion    Chronic renal insufficiency, stage II (mild)    Chronic venous insufficiency    varicosities, no reflux; dopplers 04/14/12- valvular insufficiency in the R and L GSV   Complication of anesthesia    COPD, mild (HCC)    Depression    situaltional    Diabetes mellitus    Diabetic neuropathy (HCC) 08/24/2019   Dyslipidemia associated with type 2 diabetes mellitus (HCC)    Fall at home, initial encounter 01/02/2023   GERD (gastroesophageal reflux disease)    History of cardioversion 12/2021   at [facility]   HTN (hypertension)    Hypothyroidism    Neuromuscular disorder (HCC)    neuropathy in feet   Neuropathy    notably improved following PCI with improved cardiac function   Obesity    Obesity, Class II, BMI 35.0-39.9, with comorbidity (see  actual BMI)    BMI 39; wgt loss efforts in place; seeing Dietician   Open ankle fracture 01/02/2023   Orthostatic hypotension    PONV (postoperative nausea and vomiting)    [place]   Pulmonary hypertension (HCC) 04/2012   PA pressure   Sleep apnea    uses nightly   Vitamin D  deficiency    per facility notes    Past Surgical History:  Procedure Laterality Date   ABDOMINAL AORTAGRAM N/A 11/15/2011   Procedure: ABDOMINAL Tommi Fraise;  Surgeon: Richrd Char, MD;  Location: [facility] CATH LAB;  Service: Cardiovascular;  [name];   ABDOMINAL AORTOGRAM W/LOWER EXTREMITY N/A 10/13/2019   Procedure: ABDOMINAL AORTOGRAM W/LOWER EXTREMITY;  Surgeon: Wenona Hamilton, MD;  Location: [facility] INVASIVE CV LAB;  Service: CV: Non-obst Ao-Iliac. R SFA-PopA patent with significant calcific stenosis of TP trunk-prox PTA (dominant) & CTO ATA  -> R  PTA-TP PTCA   ABDOMINAL AORTOGRAM W/LOWER EXTREMITY N/A 11/06/2022   Procedure: ABDOMINAL AORTOGRAM W/LOWER EXTREMITY;  Surgeon: Wenona Hamilton, MD;  Location: [facility] INVASIVE CV LAB;  Service: Cardiovascular;  [name];   ABDOMINAL AORTOGRAM W/LOWER EXTREMITY Right 04/01/2023   Procedure: ABDOMINAL AORTOGRAM W/LOWER EXTREMITY;  Surgeon: Margherita Shell, MD;  Location: [facility] INVASIVE CV LAB;  Service: Cardiovascular;  Laterality: Right;   ABIs  04/27/2012   mild bilateral arterial insufficiency   ANKLE FUSION Right 05/15/2023   Procedure: ARTHRODESIS ANKLE WITH HINDFOOT FUSION NAIL;  Surgeon: Laneta Pintos, MD;  [name];  Service: Orthopedics;  Laterality: Right;   APPLICATION OF WOUND VAC Right 03/31/2023   Procedure: APPLICATION OF WOUND VAC;  Surgeon: Laneta Pintos, MD;  [name];  Service: Orthopedics;  Laterality: Right;   BACK SURGERY  2005 x1   X2-2010   BUNIONECTOMY Right 11/11/2013   Procedure: RIGHT FOOT SILVER BUNIONECTOMY;  Surgeon: Amada Backer, MD;  Location: [facility];  Service: Orthopedics;   Laterality: Right;   CARDIAC CATHETERIZATION  12/11/2001   significant 3V CAD, normal LV function   CARDIOVERSION N/A 03/13/2022   Procedure: CARDIOVERSION;  Surgeon: Wendie Hamburg, MD;  Location: [facility] ENDOSCOPY;  Service: Cardiovascular;  [name];   CARDIOVERSION N/A 07/25/2022   Procedure: CARDIOVERSION;  Surgeon: Hugh Madura, MD;  [facility]: [facility] INVAS[facility] CV LAB;  Service: Cardiovascular;  [name];   Carotid Doppler  04/27/2012   right internal carotid: Elevated velocities but no evidence of plaque. Left internal carotid 40-59%   CAROTID ENDARTERECTOMY  2005   Right; recent carotid Dopplers notes elevated velocities.    colonscopy     CORONARY ANGIOPLASTY  12/21/2001   PTCA of the distal and mid AV groove circ, unsuccessful PTCA of second OM total occlusion, unsuccessful PTCA of the apical LAD total occlusion   CORONARY ANGIOPLASTY WITH STENT PLACEMENT  04/29/2012   PCI to 3 RCA lesions, Promus Premiere 2.78mmx8mm distally, mid was 2.22mx28mm and proximally 2.75x8mm, EF 55-60%   CORONARY STENT PLACEMENT  04/28/2012   PCI to LAD (3x69mm Xience DES postdilated to 3.25) and circ prox and mid (2 overlappinmg 2.22mmx12mmXience DES postdilated to 2.56mm)   DOPPLER ECHOCARDIOGRAPHY  04/28/2012   poor quality study: EF estimated 60-65%; unable to assess diastolic function (previously noted to have diastolic dysfunction); severely dilated left atrium and mild right atrium; dilated IVC consistent with increased central venous pressure.Aaron Aas   EXTERNAL FIXATION LEG Right 05/12/2023   Procedure: EXTERNAL FIXATION, LOWER EXTREMITY;  Surgeon: Laneta Pintos, MD;  [name];  Service: Orthopedics;  Laterality: Right;   HARDWARE REMOVAL Right 03/31/2023   Procedure: HARDWARE REMOVAL RIGHT ANKLE;  Surgeon: Laneta Pintos, MD;  [name];  Service: Orthopedics;  Laterality: Right;   HARDWARE REMOVAL Right 01/28/2023   Procedure: REMOVAL OF HARDWARE RIGHT ANKLE;  Surgeon: Hardy Lia, MD;  [name];  Service: Orthopedics;  Laterality: Right;   HARDWARE REMOVAL Right 06/21/2023   Procedure: REMOVAL, HARDWARE;  Surgeon: Laneta Pintos, MD;  [name];  Service: Orthopedics;  Laterality: Right;   HERNIA REPAIR     I & D EXTREMITY Right 01/02/2023   Procedure: IRRIGATION AND DEBRIDEMENT RIGHT ANKLE;  Surgeon: Hardy Lia, MD;  [name];  Service: Orthopedics;  Laterality: Right;   I & D EXTREMITY Right 04/02/2023   Procedure: DEBRIDEMENT RIGHT ANKLE;  Surgeon: Timothy Ford, MD;  Location: [facility] OR;  Service:  Orthopedics;  Laterality: Right;   IR FLUORO GUIDE CV LINE LEFT  04/22/2023   IR FLUORO GUIDE CV LINE RIGHT  04/07/2023   IR PATIENT EVAL TECH 0-60 MINS  04/07/2023   IR RADIOLOGIST EVAL & MGMT  04/08/2023   IR REMOVAL TUN CV CATH W/O FL  05/05/2023   IR US  GUIDE VASC ACCESS LEFT  04/22/2023   IR US  GUIDE VASC ACCESS RIGHT  04/07/2023   LEFT AND RIGHT HEART CATHETERIZATION WITH CORONARY ANGIOGRAM N/A 04/27/2012   Procedure: LEFT AND RIGHT HEART CATHETERIZATION WITH CORONARY ANGIOGRAM;  Surgeon: Arleen Lacer, MD;  Location: [facility] CATH LAB;  Service: Cardiovascular;  [name];   LEFT AND RIGHT HEART CATHETERIZATION WITH CORONARY ANGIOGRAM N/A 05/14/2013   Procedure: LEFT AND RIGHT HEART CATHETERIZATION WITH CORONARY ANGIOGRAM;  Surgeon: Millicent Ally, MD;  [name]; PCWP ;; [name], [name], Patent RCA ostial mid and distal stents - 70% distal runoff Dz   LUMBAR LAMINECTOMY/DECOMPRESSION MICRODISCECTOMY  01/07/2012   Procedure: LUMBAR LAMINECTOMY/DECOMPRESSION MICRODISCECTOMY 1 LEVEL;  Surgeon: Pasty Bongo, MD;  [name];  Service: Neurosurgery;  Laterality: Bilateral;   Lumbar Three-Four Decompression   LUMBAR LAMINECTOMY/DECOMPRESSION MICRODISCECTOMY N/A 07/26/2020   Procedure:  Lumbar two-three Laminectomy/Foraminotomy;  Surgeon: Audie Bleacher, MD;  [name];  Service: Neurosurgery;  [name];   ORIF ANKLE FRACTURE Right 01/02/2023   Procedure: OPEN REDUCTION INTERNAL FIXATION (ORIF)RIGHT ANKLE FRACTURE;  Surgeon: Hardy Lia, MD;  [name];  Service: Orthopedics;  Laterality: Right;   PERCUTANEOUS CORONARY STENT INTERVENTION (PCI-S) N/A 04/28/2012   Procedure: PERCUTANEOUS CORONARY STENT INTERVENTION (PCI-S);  Surgeon: Millicent Ally, MD;  Location: [facility] CATH LAB;  Service: Cardiovascular;  [name];   PERCUTANEOUS CORONARY STENT INTERVENTION (PCI-S) N/A 04/29/2012   Procedure: PERCUTANEOUS CORONARY STENT INTERVENTION (PCI-S);  Surgeon: Arleen Lacer, MD;  Location: [facility] CATH LAB;  Service: Cardiovascular;  [name];   PERIPHERAL VASCULAR ATHERECTOMY  11/06/2022   Procedure: PERIPHERAL VASCULAR ATHERECTOMY;  Surgeon: Wenona Hamilton, MD;  [facility];  Service: Cardiovascular;;   PERIPHERAL VASCULAR BALLOON ANGIOPLASTY Right 10/13/2019   Procedure: PERIPHERAL VASCULAR BALLOON ANGIOPLASTY;  Surgeon: Wenona Hamilton, MD;  [facility]: [facility] LAB;  Service: Cardiovascular;  Laterality: Right;  PTCA of R TP Trunk into PTA (Drug-coated) => Follow-up ABIs 0.9 on the right and 0.99 on the left.   PERIPHERAL VASCULAR BALLOON ANGIOPLASTY Right 04/01/2023   Procedure: PERIPHERAL VASCULAR BALLOON ANGIOPLASTY;  Surgeon: Margherita Shell, MD;  [facility] LAB;  Service: Cardiovascular;  Laterality: Right;   PERIPHERAL VASCULAR INTERVENTION Right 04/01/2023   Procedure: PERIPHERAL VASCULAR INTERVENTION;  Surgeon: Margherita Shell, MD;  [facility]: [facility] INVAS[facility] CV LAB;  Service: Cardiovascular;  Laterality: Right;   SPINE SURGERY     UMBILICAL HERNIA REPAIR  2009   steel mesh insert     reports that he quit smoking about 20 years ago. His smoking use included cigarettes. He started smoking about 62 years ago. He has  a 42 pack-year smoking history. He has never used smokeless tobacco. He reports that he does not drink alcohol and does not use drugs.  Allergies  Allergen Reactions   Beta-Lactamase Inhibitors (Beta-Lactam) Hives, Itching and Rash    AX - penicillin     Ancef  given 9/24 with  no obvious reaction   Metformin  And Related Diarrhea and Nausea Only   Other Nausea And Vomiting    General Anesthesia - sometimes causes nausea and vomiting * OK to use scopolamine  patch     Sulfa Antibiotics Hives and Other (See Comments)    Bactrim   Wellbutrin [Bupropion] Other (See Comments)    Mood Changes   Tetracyclines & Related Hives and Rash    doxycycline     Family History  Adopted: Yes  Family history unknown: Yes    Prior to Admission medications   Medication Sig Start Date End Date Taking? Authorizing Provider  acetaminophen  (TYLENOL ) 500 MG tablet Take 1 tablet (500 mg total) by mouth every 6 (six) hours as needed for mild pain (pain score 1-3). 01/07/23   Hongalgi, Anand D, MD  allopurinol  (ZYLOPRIM ) 100 MG tablet Take 0.5 tablets (50 mg total) by mouth daily. 05/10/23   Modena Andes, MD  Amino Acids-Protein Hydrolys (PRO-STAT PO) Take 30 mLs by mouth in the morning and at bedtime.    [provider]  amiodarone  (PACERONE ) 200 MG tablet Take 1 tablet (200 mg total) by mouth 2 (two) times daily. 05/10/23   Modena Andes, MD  apixaban  (ELIQUIS ) 5 MG TABS tablet TAKE 1 TABLET BY MOUTH TWICE A DAY 02/17/23   Arleen Lacer, MD  atorvastatin  (LIPITOR) 40 MG tablet Take 1 tablet (40 mg total) by mouth daily. Patient taking differently: Take 40 mg by mouth at bedtime. 05/10/23 07/17/23  Modena Andes, MD  carvedilol  (COREG ) 3.125 MG tablet Take 3.125 mg by mouth 2 (two) times daily. 07/20/23   [provider]  clopidogrel  (PLAVIX ) 75 MG tablet Take 75 mg by mouth every morning. 02/04/23   [provider]  Continuous Glucose Sensor (FREESTYLE LIBRE 3 PLUS SENSOR) MISC Inject 1  Device into the skin every 14 (fourteen) days. 06/11/23   [provider]  daptomycin  (CUBICIN ) IVPB Inject 800 mg into the vein daily. Indication:  Hardware associated ankle osteomyelitis  First Dose: Yes Last Day of Therapy:  08/02/23 Labs - Once weekly:  CBC/D, BMP, and CPK Labs - Once weekly: ESR and CRP 06/28/23 08/07/23  Doroteo Gasmen, MD  docusate sodium  (COLACE) 100 MG capsule Take 1 capsule (100 mg total) by mouth 2 (two) times daily. 06/28/23   Doroteo Gasmen, MD  DULoxetine  (CYMBALTA ) 20 MG capsule Take 1 capsule (20 mg total) by mouth daily. 07/22/23   Wouk, Haynes Lips, MD  ertapenem  (INVANZ ) IVPB Inject 1 g into the vein daily. Indication:  Hardware associated ankle osteomyelitis  First Dose: Yes Last Day of Therapy:  08/02/23 Labs - Once weekly:  CBC/D and BMP, Labs - Once weekly: ESR and CRP 06/28/23 08/07/23  Doroteo Gasmen, MD  ezetimibe  (ZETIA ) 10 MG tablet Take 10 mg by mouth at bedtime. 05/09/22   [provider]  ferrous sulfate  325 (65 FE) MG tablet Take 325 mg by mouth 3 (three) times a week. Monday, Wednesday, Friday 06/08/23   [provider]  finasteride (PROSCAR) 5 MG tablet Take 5 mg by mouth daily.    [provider]  fludrocortisone  (FLORINEF ) 0.1 MG tablet Take 1 tablet (0.1 mg total) by mouth daily. 07/15/23   Wenona Hamilton, MD  furosemide  (LASIX ) 40 MG tablet Take 40 mg by mouth daily.    [provider]  gabapentin  (NEURONTIN ) 300 MG capsule Take 1 capsule (300 mg total) by mouth at bedtime. 05/10/23   Modena Andes, MD  insulin  glargine (LANTUS  SOLOSTAR) 100 UNIT/ML Solostar Pen Inject 5 Units into the skin at bedtime. 07/22/23   Wouk, Haynes Lips, MD  insulin  lispro (HUMALOG) 100 UNIT/ML injection Inject 0-15 Units into the skin in the morning, at noon, in the evening, and at bedtime. Per sliding scale: 0-70= 0 units if <70 implement hypoglycemic protocol & notify MD/NP 71-150= 0 units 151-200=3  units 201-250= 6 units 251-300= 9 units 301-350= 12 units 351-400= 15 units 401-999 if CBG>400. Notify MD/ NP,    [provider]  levothyroxine  (SYNTHROID , LEVOTHROID) 88 MCG tablet Take 88 mcg by mouth daily before breakfast.    [provider]  magnesium  oxide (MAG-OX) 400 MG tablet Take 1 tablet by mouth daily. 06/08/23   [provider]  methocarbamol  (ROBAXIN ) 500 MG tablet Take 1 tablet (500 mg total) by mouth every 6 (six) hours as needed for muscle spasms. 05/20/23   Versie Gores, PA-C  midodrine  (PROAMATINE ) 10 MG tablet Take 1 tablet (10 mg total) by mouth 3 (three) times daily with meals. Take one hour prior to physical therapy 07/15/23   Wenona Hamilton, MD  Multiple Vitamin (MULTIVITAMIN WITH MINERALS) TABS tablet Take 1 tablet by mouth daily. 01/08/23   Hongalgi, Anand D, MD  nitroGLYCERIN  (NITROSTAT ) 0.4 MG SL tablet PLACE 1 TAB UNDER THE TONGUE EVERY 5 MINUTES AS NEEDED FOR CHEST PAIN (SEVERE PRESSURE OR TIGHTNESS) Patient taking differently: Place 0.4 mg under the tongue every 5 (five) minutes as needed for chest pain. PLACE 1 TAB UNDER THE TONGUE EVERY 5 MINUTES AS NEEDED FOR CHEST PAIN (SEVERE PRESSURE OR TIGHTNESS) 02/22/19   Arleen Lacer, MD  nutrition supplement, JUVEN, (JUVEN) PACK Take 1 packet by mouth 2 (two) times daily between meals. 06/28/23   Doroteo Gasmen, MD  Nystatin (GERHARDT'S BUTT CREAM) CREA Apply 1 Application topically 2 (two) times daily. 06/28/23   Fonnie Iba I, MD  pantoprazole  (PROTONIX ) 40 MG tablet TAKE 1 TABLET BY MOUTH EVERY DAY 12/13/22   Dunn, Dayna N, PA-C  polyethylene glycol (MIRALAX  / GLYCOLAX ) 17 g packet Take 17 g by mouth daily as needed for mild constipation. 06/28/23   Doroteo Gasmen, MD  sertraline  (ZOLOFT ) 100 MG tablet Take 1 tablet (100 mg total) by mouth daily. 07/22/23   Wouk, Haynes Lips, MD  tirzepatide  (MOUNJARO ) 15 MG/0.5ML Pen Inject 15 mg into the skin once a week. Patient taking  differently: Inject 15 mg into the skin once a week. Sunday 12/30/22     Vitamin D , Ergocalciferol , (DRISDOL ) 1.25 MG (50000 UNIT) CAPS capsule Take 1 capsule (50,000 Units total) by mouth every 7 (seven) days. Patient taking differently: Take 50,000 Units by mouth every 7 (seven) days. Sundays 05/27/23   Haydee Lipa, MD     Physical Exam: Vitals:   07/30/23 0015 07/30/23 0030 07/30/23 0045 07/30/23 0053  BP: (!) 171/66 (!) 164/67 (!) 172/73   Pulse: 66 66 66   Resp: 13 16 13    Temp:    97.9 F (36.6 C)  TempSrc:      SpO2: 100% 100% 99%   Weight:      Height:        Physical Exam Vitals and nursing note reviewed.  Constitutional:      General: He is not in acute distress.    Appearance: He is obese. He is not ill-appearing.  HENT:     Mouth/Throat:     Mouth: Mucous membranes are moist.  Eyes:  Pupils: Pupils are equal, round, and reactive to light.  Cardiovascular:     Rate and Rhythm: Normal rate and regular rhythm.     Pulses: Normal pulses.     Heart sounds: Normal heart sounds.  Pulmonary:     Effort: Pulmonary effort is normal.     Breath sounds: Normal breath sounds.  Abdominal:     Palpations: Abdomen is soft.  Musculoskeletal:     Cervical back: Neck supple.     Right lower leg: No edema.     Left lower leg: No edema.  Skin:    General: Skin is dry.     Capillary Refill: Capillary refill takes less than 2 seconds.     Findings: No bruising, erythema, lesion or rash.  Neurological:     Mental Status: He is alert and oriented to person, place, and time.  Psychiatric:        Mood and Affect: Mood normal.      Labs on Admission: I have personally reviewed following labs and imaging studies  CBC: Recent Labs  Lab 07/29/23 2043  WBC 6.7  NEUTROABS 3.9  HGB 10.2*  HCT 29.7*  MCV 87.1  PLT 245   Basic Metabolic Panel: Recent Labs  Lab 07/29/23 2043  NA 139  K 3.3*  CL 108  CO2 21*  GLUCOSE 94  BUN 22  CREATININE 2.34*  CALCIUM   7.9*   GFR: Estimated Creatinine Clearance: 37.4 mL/min (A) (by C-G formula based on SCr of 2.34 mg/dL (H)). Liver Function Tests: Recent Labs  Lab 07/29/23 2043  AST 39  ALT 26  ALKPHOS 118  BILITOT 0.8  PROT 5.1*  ALBUMIN  1.9*   No results for input(s): LIPASE, AMYLASE in the last 168 hours. No results for input(s): AMMONIA in the last 168 hours. Coagulation Profile: No results for input(s): INR, PROTIME in the last 168 hours. Cardiac Enzymes: No results for input(s): CKTOTAL, CKMB, CKMBINDEX, TROPONINI, TROPONINIHS in the last 168 hours. BNP (last 3 results) Recent Labs    04/17/23 1217  BNP 765.1*   HbA1C: No results for input(s): HGBA1C in the last 72 hours. CBG: No results for input(s): GLUCAP in the last 168 hours. Lipid Profile: No results for input(s): CHOL, HDL, LDLCALC, TRIG, CHOLHDL, LDLDIRECT in the last 72 hours. Thyroid  Function Tests: No results for input(s): TSH, T4TOTAL, FREET4, T3FREE, THYROIDAB in the last 72 hours. Anemia Panel: No results for input(s): VITAMINB12, FOLATE, FERRITIN, TIBC, IRON, RETICCTPCT in the last 72 hours. Urine analysis:    Component Value Date/Time   COLORURINE YELLOW 04/15/2023 1732   APPEARANCEUR CLOUDY (A) 04/15/2023 1732   LABSPEC 1.014 04/15/2023 1732   PHURINE 5.0 04/15/2023 1732   GLUCOSEU >=500 (A) 04/15/2023 1732   HGBUR MODERATE (A) 04/15/2023 1732   BILIRUBINUR NEGATIVE 04/15/2023 1732   BILIRUBINUR negative 05/12/2018 1213   BILIRUBINUR small 06/29/2014 0859   KETONESUR NEGATIVE 04/15/2023 1732   PROTEINUR >=300 (A) 04/15/2023 1732   UROBILINOGEN 0.2 05/12/2018 1213   UROBILINOGEN 1.0 05/02/2014 2015   NITRITE NEGATIVE 04/15/2023 1732   LEUKOCYTESUR NEGATIVE 04/15/2023 1732    Radiological Exams on Admission: I have personally reviewed images No results found.   EKG: My personal interpretation of EKG shows:  EKG showed normal sinus rhythm,  heart rate 69 and borderline repolarization abnormality.  And QTc prolongation 603 msec.    Assessment/Plan: Principal Problem:   QT prolongation Active Problems:   Acute renal failure superimposed on stage 3b chronic kidney disease (HCC)  Infection of lower extremity associated with hardware (HCC)   Pulmonary hypertension,   Essential hypertension   OSA on CPAP   Paroxysmal atrial fibrillation (HCC)   Hypothyroidism   PAD (peripheral artery disease) (HCC)   Normocytic anemia   PVD (peripheral vascular disease) (HCC)   Neuropathy   Restrictive lung disease   Persistent atrial fibrillation (HCC)   Major depressive disorder, recurrent episode, moderate (HCC)   Hypokalemia   History of COPD   History of peripheral vascular disease   Non-insulin  dependent type 2 diabetes mellitus (HCC)   BPH (benign prostatic hyperplasia)    Assessment and Plan: QTc prolongation -Patient has been presented to emergency department referred from outpatient ID clinic as lab work showed low hemoglobin 5.2 and patient was hypotensive in the clinic.  However at presentation to ED patient found to be hypertensive blood pressure upper 170s range.  CBC showing hemoglobin 10.2 which is at baseline.  Patient denies any evidence of bleeding. - Further workup revealed include EKG Normal sinus rhythm heart rate 69, prolonged QTc 603. -CMP showing low potassium 3.3, corrected calcium  9.3.  Checking TSH level. -Chart review patient has normal QTc in April however since May 2025 being started on daptomycin  and ertapenem  there is a prolonged QTc. - Per chart review patient has been on amiodarone  200 mg twice daily since 2023 however never had any episode of prolonged QTc. - Concern for prolonged QTc in the setting of daptomycin  and ertapenem  with combination of Zoloft , Cymbalta  and amiodarone . - Discussed case with on-call cardiology recommending to consult and discuss ID for further antibiotic management.   Cardiology will decide amiodarone  dosing. - Holding Cymbalta , Zoloft  and IV antibiotic include daptomycin  and ertapenem  until will get further recommendation from ID. - Please reach out to infectious disease in the daytime for further antibiotic guidance. -Continue to monitor electrolytes and replete as needed. - Need to avoid further QTc prolonging medications include Zofran , Compazine, Atarax, Ativan, Zoloft , Cymbalta , from patient's home medication list.   Acute kidney injury superimposed CKD stage IIIb -Elevated creatinine 2.34.  Concern for prerenal acute kidney injury in the setting of dehydration and poor oral intake. - Checking UA, urine creatinine and sodium. - Starting LR 100 cc/h.  Monitor renal function improvement, I/O and nephrotoxic agent.   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 Shereen Dike clinic note from 6/10/202 plan to continue IV antibiotic until 07/31/2023.  After finishing antibiotic recommended to [facility] picc in end of June. -Holding antibiotic for tonight in the setting of prolonged QTc. - Please reach out to on-call infectious disease in the daytime for further recommendation regarding switching to alternative IV antibiotic.   Hypokalemia Hypocalcemia Hypomagnesemia -Repleted electrolytes in the ED.  Chronic hypotension-resolved Essential hypertension -During last hospitalization patient found to be hypotensive being started on midodrine  3 times and fludrocortisone .  Also Flomax  was discontinued due to hypotension..  -Presently blood pressure is elevated.  Holding midodrine  fludrocortisone . -Starting IV hydralazine  as needed for blood pressure management.   Paroxysmal atrial fibrillation - At home patient is on amiodarone  200 mg twice daily.  In the setting of prolonged QTc holding amiodarone  and  consulted cardiology for amiodarone  dosing. -Continue Eliquis  5 mg twice daily -Continue Coreg  3.125 mg twice daily.   History of CAD s/p CABG - Continue Coreg , Lipitor and Eliquis    History  of PAD -Continue Lipitor and Eliquis   OSA on CPAP -Continue CPAP at bedtime.  Chronic normocytic anemia -CBC showing stable H&H 10.2 and 29.  Normal platelet and WBC count.  Patient reported no evidence of GI bleed.  Non-insulin -dependent DM type II History of DM type II diet controlled.  A1c within normal range.  Not currently on insulin  at home.   Generalized anxiety disorder History of depression -Holding Cymbalta  and Zoloft  in the setting of prolonged QTc. -Also need to avoid benzodiazepine Atarax in the setting of prolonged QTc.  BPH - As blood pressure is stable and presently elevated restarting Prozac 5 mg daily  History of COPD - Stable.  Continue to check pulse ox. - Continue ipratropium as needed for wheezing shortness of breath.  Will avoid albuterol  in the setting of prolonged QTc.   DVT prophylaxis:  Eliquis  Code Status:  Full Code Diet: Heart healthy carb modified diet Family Communication:   Family was present at bedside, at the time of interview. Opportunity was given to ask question and all questions were answered satisfactorily.  Disposition Plan: Cardiac monitoring for QTc.  Need to discuss with infectious disease regarding antibiotic management. Consults: Cardiology and infectious disease Admission status:   Inpatient, Telemetry bed  Severity of Illness: The appropriate patient status for this patient is INPATIENT. Inpatient status is judged to be reasonable and necessary in order to provide the required intensity of service to ensure the patient's safety. The patient's presenting symptoms, physical exam findings, and initial radiographic and laboratory data in the context of their chronic comorbidities is felt to place them at high risk for further clinical  deterioration. Furthermore, it is not anticipated that the patient will be medically stable for discharge from the hospital within 2 midnights of admission.   * I certify that at the point of admission it is my clinical judgment that the patient will require inpatient hospital care spanning beyond 2 midnights from the point of admission due to high intensity of service, high risk for further deterioration and high frequency of surveillance required.Aaron Aas    Pricila Bridge, MD [facility]  How to contact the [facility] Attending or Consulting provider 7A - 7P or covering provider during after hours 7P -7A, for this patient.  Check the care team in [facility] and look for a) attending/consulting [facility] provider listed and b) the [facility] team listed Log into [email or web address] and use [place]'s universal password to access. If you do not have the password, please contact the hospital operator. Locate the [facility] provider you are looking for under [facility] and page to a number that you can be directly reached. If you still have difficulty reaching the provider, please page the [facility] (Director on Call) for the Hospitalists listed on amion for assistance.  07/30/2023, 1:21 AM

## 2023-07-31 DIAGNOSIS — R9431 Abnormal electrocardiogram [ECG] [EKG]: Secondary | ICD-10-CM | POA: Diagnosis not present

## 2023-07-31 LAB — CBC
HCT: 28.9 % — ABNORMAL LOW (ref 39.0–52.0)
Hemoglobin: 9.5 g/dL — ABNORMAL LOW (ref 13.0–17.0)
MCH: 28.6 pg (ref 26.0–34.0)
MCHC: 32.9 g/dL (ref 30.0–36.0)
MCV: 87 fL (ref 80.0–100.0)
Platelets: 197 10*3/uL (ref 150–400)
RBC: 3.32 MIL/uL — ABNORMAL LOW (ref 4.22–5.81)
RDW: 16.6 % — ABNORMAL HIGH (ref 11.5–15.5)
WBC: 5.2 10*3/uL (ref 4.0–10.5)
nRBC: 0 % (ref 0.0–0.2)

## 2023-07-31 LAB — BASIC METABOLIC PANEL WITH GFR
Anion gap: 8 (ref 5–15)
BUN: 17 mg/dL (ref 8–23)
CO2: 20 mmol/L — ABNORMAL LOW (ref 22–32)
Calcium: 7.9 mg/dL — ABNORMAL LOW (ref 8.9–10.3)
Chloride: 109 mmol/L (ref 98–111)
Creatinine, Ser: 2.08 mg/dL — ABNORMAL HIGH (ref 0.61–1.24)
GFR, Estimated: 34 mL/min — ABNORMAL LOW (ref >60)
Glucose, Bld: 94 mg/dL (ref 70–99)
Potassium: 3.4 mmol/L — ABNORMAL LOW (ref 3.5–5.1)
Sodium: 137 mmol/L (ref 135–145)

## 2023-07-31 MED ORDER — SODIUM CHLORIDE 0.9 % IV SOLN: INTRAVENOUS | Status: AC

## 2023-07-31 NOTE — Progress Notes (Addendum)
 Progress Note   Patient: Harold [name] NWG:[id number] DOB: 09-08-1955 DOA: 07/29/2023     1 DOS: the patient was seen and examined on 07/31/2023    Brief hospital course: From HPI Harold [name] is a 68 y.o. male with medical history significant of  Right lower extremity ORIF associated infection s/p I and D on 03/31/23 and 04/02/23 ( cx MRSE and enterobacter claoacae), removal of the hardware by Dr. Curtiss [name] 06/21/2023,  chronic infection of the ankle discharged hospital 07/22/2023 to continue ertapenem  and daptomycin  per ID recommendation, chronic hypotension, essential hypertension, COPD, CAD status post CABG, GERD, carotid stenosis stenosis, peripheral vascular disease, chronic venous stasis bilateral lower extremities, hypothyroidism, depression, DM type II, OSA on CPAP, pulmonary hypertension, paroxysmal atrial fibrillation, prolonged QTc, and BPH.   Patient has been seen in fisheries clinic today but by Harold [name]. Right foot doing well no pain; small dimple of an area that is scabbing but no drainage and recommended IV antibiotic until 07/31/2023.  Per ID and Dr. Curtiss [name] all hardware not out, rod is in and no immediate plans for removal.    In the ID clinic patient found hypotensive with associated dizziness which is why patient has been sent to ED for evaluation for hypotension and dizziness. Also patient reported that outpatient workup showed low hemoglobin 5.2.  He is also on Eliquis  and Plavix .  Patient does not have any evidence of bleeding.         Assessment and Plan: QTc prolongation-improved  Further workup revealed include EKG Normal sinus rhythm heart rate 69, prolonged QTc 603. Cardiology on board and case discussed Follow-up TSH Continue monitoring electrolytes and correct as needed Patient completed a course of antibiotics today Continue to hold Cymbalta  as well as Zoloft  Cardiology dosing amiodarone  appropriately - Need to avoid further QTc prolonging medications  include Zofran , Compazine, Atarax, Ativan, Zoloft , Cymbalta , from patient's home medication list.     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   Chronic hypotension-resolved Essential hypertension -Starting IV hydralazine  as needed for blood pressure management. Continue antihypertensives as blood pressure allows  Paroxysmal atrial fibrillation - At home patient is on amiodarone  200 mg twice daily.  In the setting of prolonged QTc holding amiodarone  and consulted cardiology for amiodarone  dosing. -Continue Eliquis  5 mg twice daily Continue Coreg  3.125 mg twice daily.     History of CAD s/p CABG - Continue Coreg , Lipitor and Eliquis      History of PAD Continue Lipitor and Eliquis    OSA on CPAP Continue CPAP at bedtime.   Chronic normocytic anemia -CBC showing stable H&H 10.2 and 29.  Normal platelet and WBC count.  Patient reported no evidence of GI bleed.   Non-insulin -dependent DM type II History of DM type II diet controlled.  A1c within normal range.  Not currently on insulin  at home.     Generalized anxiety disorder History of depression Continue to hold Cymbalta  and Zoloft  in the setting of prolonged QTc. -Also need to avoid benzodiazepine Atarax in the setting of prolonged QTc.   BPH Holding Prozac in the setting of QTc prolongation   History of COPD -  Stable.  Continue to check pulse ox. - Continue ipratropium as needed for wheezing shortness of breath.  Will avoid albuterol  in the setting of prolonged QTc.     DVT prophylaxis:   Eliquis    Code Status:  Full Code   Diet: Heart healthy carb modified diet   Disposition Plan: Hopefully home after clearance by cardiology   Consults: Cardiology and infectious disease     Subjective:  Patient seen and examined at bedside this morning Patient appeared extremely drowsy this morning on rounds QTc-improved Denies nausea vomiting abdominal pain no chest pain   Physical Exam Vitals and nursing note reviewed.  Constitutional:      General: He is not in acute distress.    Appearance: He is obese. He is not ill-appearing.  HENT:     Mouth/Throat:     Mouth: Mucous membranes are moist.  Eyes:     Pupils: Pupils are equal, round, and reactive to light.  Cardiovascular:     Rate and Rhythm: Normal rate and regular rhythm.  Pulmonary:     Effort: Pulmonary effort is normal.     Breath sounds: Normal breath sounds.  Abdominal:     Palpations: Abdomen is soft.  Musculoskeletal:     Cervical back: Neck supple.  Neurological:     Mental Status: He is alert and oriented to person, place, and time.  Psychiatric:        Mood and Affect: Mood normal.    Family Communication: Present at bedside   Disposition: Status is: Inpatient   Time spent: 45 minutes  Data Reviewed:    Vitals:   07/31/23 0328 07/31/23 0522 07/31/23 0740 07/31/23 1120  BP:  (!) 161/70 (!) 168/64 (!) 192/70  Pulse:  62 62 65  Resp: 19 19 20 16   Temp:  97.7 F (36.5 C) 98.2 F (36.8 C) 98.5 F (36.9 C)  TempSrc:  Oral Oral Oral  SpO2:  100% 100% 99%  Weight:      Height:          Latest Ref Rng & Units 07/31/2023    3:13 AM 07/30/2023    4:14 AM 07/29/2023    8:43 PM  CBC  WBC 4.0 - 10.5 K/uL 5.2  6.4  6.7   Hemoglobin 13.0 - 17.0 g/dL 9.5  91.4  78.2   Hematocrit 39.0 - 52.0 % 28.9  31.0  29.7   Platelets 150 - 400 K/uL 197  239  245        Latest Ref Rng & Units 07/31/2023    3:13 AM 07/30/2023    4:14 AM 07/29/2023    8:43 PM  BMP  Glucose 70 - 99 mg/dL 94  956  94   BUN  8 - 23 mg/dL 17  22  22    Creatinine 0.61 - 1.24 mg/dL 2.13  0.86  5.78   Sodium 135 - 145 mmol/L 137  138  139   Potassium 3.5 - 5.1 mmol/L 3.4  3.0  3.3   Chloride 98 - 111 mmol/L 109  107  108   CO2 22 - 32 mmol/L 20  20  21    Calcium  8.9 - 10.3 mg/dL 7.9  8.3  7.9      Author: Ezzard Holms, MD 07/31/2023 3:20 PM  For on call review [email or web address].

## 2023-07-31 NOTE — Evaluation (Signed)
 Physical Therapy Evaluation Patient Details Name: Harold [name] MRN: [id number] DOB: [birth year] Today's Date: 07/31/2023  History of Present Illness  Harold [name] is a 68 y.o. male admitted 07/29/23 for QT prolongation. PMHx: HTN, HLD, CAD s/p PCI, COPD, DM2, CKD stage IIIb, PVD, gout, OSA, neuropathy, COPD, a-fib, CAD s/p percutaneous coronary angioplasty, angina, obesity, RLE ORIF with associated infection s/p I&D on 03/31/23 and 04/02/23, hardware removal 06/21/23, on antibiotics course scheduled to end 6/12. Of note, recent admission 5/29-6/3 for hypotension.   Clinical Impression  Pt admitted with above diagnosis. PTA, pt was residing at a SNF receiving PT/OT services and required +2 assist for functional mobility and ADLs. He reports transferring into a manual w/c and would occasionally propel himself. Pt currently with functional limitations due to the deficits listed below (see PT Problem List). He required minA x1-2 for bed mobility, modA x2 for sit<>stand using RW, and min-modA x2 for side steps using RW. Pt is limited by symptomatic orthostatic hypotension. Pt will benefit from acute skilled PT to increase his independence and safety with mobility to allow discharge. Recommend return to post-acute rehab, <3 hours/day therapy.    07/31/23 1500  Vital Signs  Patient Position (if appropriate) Orthostatic Vitals  Orthostatic Lying   BP- Lying 151/62 (MAP 88)  Pulse- Lying 69  Orthostatic Sitting  BP- Sitting 109/64 (MAP 75; pt c/o dizziness which improved with seated rest)  Pulse- Sitting 72  Orthostatic Standing at 0 minutes  BP- Standing at 0 minutes  (NT - pt unable to maintain static stance  for the duration of BP assessment; immediately upon sitting BP 105/59 (73))  Orthostatic Standing at 3 minutes  BP- Standing at 3 minutes  (NT - pt unable to maintain static stance for ~6mins d/t dizziness and feeling lightheaded)           If plan is discharge home, recommend  the following: Two people to help with walking and/or transfers;A lot of help with bathing/dressing/bathroom;Assistance with cooking/housework;Assist for transportation;Help with stairs or ramp for entrance   Can travel by private vehicle   No    Equipment Recommendations None recommended by PT  Recommendations for Other Services       Functional Status Assessment Patient has had a recent decline in their functional status and demonstrates the ability to make significant improvements in function in a reasonable and predictable amount of time.     Precautions / Restrictions Precautions Precautions: Fall Recall of Precautions/Restrictions: Intact Restrictions Weight Bearing Restrictions Per Provider Order: No      Mobility  Bed Mobility Overal bed mobility: Needs Assistance Bed Mobility: Supine to Sit, Sit to Supine, Rolling Rolling: Min assist, Used rails   Supine to sit: Min assist, +2 for physical assistance, +2 for safety/equipment Sit to supine: Min assist   General bed mobility comments: Pt rolled to his R with cues to bend L knee and push over and reach across with L hand to bedrail and pull over. He maintained sidelying while bed pan was removed and  pericare was address by NT. Pt sat up on the L side of his bed with increased time. Assist to bring BLE off EOB, elevate trunk, and scoot hips fwd with use of bedpad. Returning to bed required assist to bring BLE into bed and controlled trunk down.    Transfers Overall transfer level: Needs assistance Equipment used: Rolling walker (2 wheels) Transfers: Sit to/from Stand Sit to Stand: Mod assist, +2 physical assistance, +2 safety/equipment  General transfer comment: Pt performed sit<>stand from lowest bed height x3 reps. VC/TC for proper hand placement, foot positioning, and sequencing. He required modA x2 to power up into standing. Pt c/o dizziness in standing and would require seated rest breaks to recover  before attempting again. Good eccentric control with sitting.    Ambulation/Gait Ambulation/Gait assistance: Min assist, Mod assist, +2 physical assistance, +2 safety/equipment Gait Distance (Feet): 2 Feet Assistive device: Rolling walker (2 wheels)         General Gait Details: Pt took lateral steps along EOB to the L to get towards HOB. He demonstrated a posterior and left lateral lean requiring min-modA x2 for safety and stability. Pt would pivot on L foot to inch it over. He maintained a NBOS and limited foot clearence. Pt would fatigue and c/o dizziness requiring breaks. No LOB, but unsteady.  Stairs            Wheelchair Mobility     Tilt Bed    Modified Rankin (Stroke Patients Only)       Balance Overall balance assessment: Needs assistance Sitting-balance support: Bilateral upper extremity supported, Feet supported Sitting balance-Leahy Scale: Fair Sitting balance - Comments: Pt sat EOB with BUE support and CGA-minA. He demonstrated a posterior and L lateral lean with minimal ability to correct despite multi-modal cueing. Postural control: Posterior lean, Left lateral lean Standing balance support: Bilateral upper extremity supported, During functional activity, Reliant on assistive device for balance Standing balance-Leahy Scale: Poor Standing balance comment: Pt dependent on RW and min-modA x2. He is unsteady and demonstrates a posterior and left lateral lean.                             Pertinent Vitals/Pain Pain Assessment Pain Assessment: No/denies pain    Home Living Family/patient expects to be discharged to:: Skilled nursing facility Living Arrangements: Spouse/significant other (Husband, Harold [name]) Available Help at Discharge: Family;Available 24 hours/day (Husband works from home) Type of Home: House Home Access: Ramped entrance       Home Layout: One level Home Equipment: Pharmacist, hospital (2 wheels);Transport chair;Grab bars  - toilet;Grab bars - tub/shower;BSC/3in1;Wheelchair - manual Additional Comments: was at [facility] in [place] PTA    Prior Function Prior Level of Function : Needs assist;History of Falls (last six months);Driving       Physical Assist : Mobility (physical);ADLs (physical) Mobility (physical): Bed mobility;Transfers ADLs (physical): Dressing;Toileting;IADLs Mobility Comments: Pt reports it takes 2 facility staff members to get him up and to stand pivot him into w/c. He sometimes will push himself in the w/c. Reports 6 falls in the last 58mo. ADLs Comments: Pt reports it takes 2 facility staff members to help him with ADLs. He has been using a bed pan. Retired. Drives. Facility is managing medications currently at home husband takes care of it.     Extremity/Trunk Assessment   Upper Extremity Assessment Upper Extremity Assessment: Defer to OT evaluation    Lower Extremity Assessment Lower Extremity Assessment: Generalized weakness    Cervical / Trunk Assessment Cervical / Trunk Assessment: Normal  Communication   Communication Communication: No apparent difficulties    Cognition Arousal: Alert Behavior During Therapy: WFL for tasks assessed/performed   PT - Cognitive impairments: No apparent impairments                       PT - Cognition Comments: Pt A,Ox4. Following commands: Intact  Cueing Cueing Techniques: Verbal cues     General Comments General comments (skin integrity, edema, etc.): Orthostatics taken. Pt with symptomatic orthostatic hypotension, resolved with seated rest breaks.    Exercises     Assessment/Plan    PT Assessment Patient needs continued PT services  PT Problem List Decreased strength;Decreased range of motion;Decreased activity tolerance;Decreased balance;Decreased mobility;Obesity       PT Treatment Interventions DME instruction;Functional mobility training;Therapeutic activities;Therapeutic exercise;Balance  training;Patient/family education;Wheelchair mobility training;Cognitive remediation;Gait training    PT Goals (Current goals can be found in the Care Plan section)  Acute Rehab PT Goals Patient Stated Goal: Return to rehab and recover strength and independence PT Goal Formulation: With patient Time For Goal Achievement: 08/14/23 Potential to Achieve Goals: Fair    Frequency Min 2X/week     Co-evaluation PT/OT/SLP Co-Evaluation/Treatment: Yes Reason for Co-Treatment: For patient/therapist safety;To address functional/ADL transfers PT goals addressed during session: Mobility/safety with mobility;Balance         AM-PAC PT 6 Clicks Mobility  Outcome Measure Help needed turning from your back to your side while in a flat bed without using bedrails?: A Little Help needed moving from lying on your back to sitting on the side of a flat bed without using bedrails?: Total Help needed moving to and from a bed to a chair (including a wheelchair)?: Total Help needed standing up from a chair using your arms (e.g., wheelchair or bedside chair)?: Total Help needed to walk in hospital room?: Total Help needed climbing 3-5 steps with a railing? : Total 6 Click Score: 8    End of Session Equipment Utilized During Treatment: Gait belt Activity Tolerance: Patient tolerated treatment well Patient left: in bed;with call bell/phone within reach Nurse Communication: Mobility status PT Visit Diagnosis: Muscle weakness (generalized) (M62.81);Difficulty in walking, not elsewhere classified (R26.2);Unsteadiness on feet (R26.81);Other abnormalities of gait and mobility (R26.89)    Time: 2952-8413 PT Time Calculation (min) (ACUTE ONLY): 38 min   Charges:   PT Evaluation $PT Eval Low Complexity: 1 Low PT Treatments $Therapeutic Activity: 8-22 mins PT General Charges $$ ACUTE PT VISIT: 1 Visit         Glenford Lanes, PT, DPT Acute Rehabilitation Services Office: [phone] Secure Chat  Preferred  Riva Chester 07/31/2023, 3:47 PM

## 2023-07-31 NOTE — Progress Notes (Signed)
   07/31/23 1933  BiPAP/CPAP/SIPAP  BiPAP/CPAP/SIPAP Pt Type Adult  Reason BIPAP/CPAP not in use Non-compliant (pt refused)  BiPAP/CPAP /SiPAP Vitals  Pulse Rate 63  Resp 16  SpO2 98 %  Bilateral Breath Sounds Diminished

## 2023-07-31 NOTE — Progress Notes (Signed)
  Progress Note  Patient Name: Harold [name] Date of Encounter: 07/31/2023 [facility] Cardiologist: Randene Bustard, MD   Interval Summary   Feels well and feels ok to go home.   Vital Signs Vitals:   07/31/23 0007 07/31/23 0328 07/31/23 0522 07/31/23 0740  BP: (!) 177/68  (!) 161/70 (!) 168/64  Pulse: 62  62 62  Resp: 19 19 19 20   Temp: (!) 97.5 F (36.4 C)  97.7 F (36.5 C) 98.2 F (36.8 C)  TempSrc: Oral  Oral Oral  SpO2: 100%  100% 100%  Weight:      Height:        Intake/Output Summary (Last 24 hours) at 07/31/2023 1015 Last data filed at 07/31/2023 0920 Gross per 24 hour  Intake 1565.33 ml  Output 350 ml  Net 1215.33 ml      07/30/2023    3:38 PM 07/29/2023    8:29 PM 07/22/2023    1:27 AM  Last 3 Weights  Weight (lbs) 229 lb 15 oz 224 lb 13.9 oz 226 lb 10.1 oz  Weight (kg) 104.3 kg 102 kg 102.8 kg      Telemetry/ECG  SR - Personally Reviewed  Physical Exam  GEN: No acute distress.   Neck: No JVD Cardiac: RRR, no murmurs, rubs, or gallops.  Respiratory: Clear to auscultation bilaterally. GI: Soft, nontender, non-distended  MS: No edema  Assessment & Plan  #QT prolongation #Paroxysmal atrial fibrillation - Qtc improved today. Very difficult to measure given probably U wave and wide QRS, EKG may overestimate a bit. - personally calculated Qtc today is about 494 ms which has improved from 07/29/23 which was personally calculated at 527 ms.  - continue to hold QT prolonging agents if non essential.  - Reviewed with EP. Has maintained SR since 07/25/22 dccv by review of EKGs. He was taking amio 200 mg BID in error. For now would hold amiodarone  until follow up given question of QT prolongation. He has EP follow up 08/19/23 at which point can be discussed further, vs consideration of ablation or alternate AAD.  - cardiology team to consider outpatient echocardiogram given progressive conduction system disease with widening of QRS.    [facility] will sign off.   The patient is ready for discharge today from a cardiac standpoint. Medication Recommendations:  hold amiodarone  Other recommendations (labs, testing, etc):  n/a Follow up as an outpatient:  7/1 with afib clinic.   For questions or updates, please contact [facility] Please consult [email or web address] for contact info under       Signed, Miracle Criado A Germaine Shenker, MD

## 2023-07-31 NOTE — Evaluation (Addendum)
 Occupational Therapy Evaluation Patient Details Name: JHAN [name] MRN: [id number] DOB: 1955/07/04 Today's Date: 07/31/2023   History of Present Illness   Harold [name] is a 68 y.o. male admitted 07/29/23 for QT prolongation. PMHx: HTN, HLD, CAD s/p PCI, COPD, DM2, CKD stage IIIb, PVD, gout, OSA, neuropathy, COPD, a-fib, CAD s/p percutaneous coronary angioplasty, angina, obesity, RLE ORIF with associated infection s/p I&D on 03/31/23 and 04/02/23, hardware removal 06/21/23, on antibiotics course scheduled to end 6/12. Of note, recent admission 5/29-6/3 for hypotension.     Clinical Impressions Pt admitted based on above, and was seen based on problem list below. PTA pt was completing short term rehab, and was requiring mod to max assist +2 ADLs. Today pt is requiring set up  to max +2  for ADLs. Bed mobility was min +2 assist and functional transfers are  mod +2 . Noted left lateral and posterior lean noted sitting EOB and in standing, requiring min assist to correct sitting and mod to max assist in standing. Orthostatic vitals recorded see PT note for details. Symptoms limiting standing balance and activity tolerance. Recommendation pt return to<3 hours of skilled rehab daily. OT will continue to follow acutely to maximize functional independence.        If plan is discharge home, recommend the following:   Two people to help with walking and/or transfers;A lot of help with bathing/dressing/bathroom;Assistance with cooking/housework;Assist for transportation;Help with stairs or ramp for entrance     Functional Status Assessment   Patient has had a recent decline in their functional status and demonstrates the ability to make significant improvements in function in a reasonable and predictable amount of time.     Equipment Recommendations   Other (comment) (Defer to next venue)     Recommendations for Other Services         Precautions/Restrictions    Precautions Precautions: Fall Recall of Precautions/Restrictions: Intact Restrictions Weight Bearing Restrictions Per Provider Order: No     Mobility Bed Mobility Overal bed mobility: Needs Assistance Bed Mobility: Supine to Sit, Sit to Supine, Rolling Rolling: Min assist, Used rails Sidelying to sit: Min assist, +2 for physical assistance Supine to sit: Min assist, +2 for physical assistance, +2 for safety/equipment Sit to supine: Min assist   General bed mobility comments: Light assist for BLEs and trunk    Transfers Overall transfer level: Needs assistance Equipment used: Rolling walker (2 wheels) Transfers: Sit to/from Stand Sit to Stand: Mod assist, +2 physical assistance, +2 safety/equipment           General transfer comment: Mod assist +2 to come to stand, heavy left lateral and posterior lean, max assist +2 for safety for lateral steps along EOB      Balance Overall balance assessment: Needs assistance Sitting-balance support: Bilateral upper extremity supported, Feet supported Sitting balance-Leahy Scale: Fair Sitting balance - Comments: Pt sat EOB with BUE support and CGA-minA. He demonstrated a posterior and L lateral lean with minimal ability to correct despite multi-modal cueing. Postural control: Posterior lean, Left lateral lean Standing balance support: Bilateral upper extremity supported, During functional activity, Reliant on assistive device for balance Standing balance-Leahy Scale: Poor Standing balance comment: Reliant on RW and therapist support to maintain upright and counter left lateral lean                           ADL either performed or assessed with clinical judgement   ADL Overall ADL's :  Needs assistance/impaired Eating/Feeding: Set up;Sitting   Grooming: Set up;Sitting   Upper Body Bathing: Moderate assistance;Sitting   Lower Body Bathing: Maximal assistance;Sitting/lateral leans;Sit to/from stand;+2 for physical  assistance   Upper Body Dressing : Moderate assistance;Sitting   Lower Body Dressing: Maximal assistance;Sitting/lateral leans;Sit to/from stand;+2 for physical assistance   Toilet Transfer: +2 for physical assistance;+2 for safety/equipment;Maximal assistance;Stand-pivot;Rolling walker (2 wheels)           Functional mobility during ADLs: Maximal assistance;+2 for physical assistance;Rolling walker (2 wheels)       Vision Baseline Vision/History: 1 Wears glasses Vision Assessment?: No apparent visual deficits     Perception         Praxis         Pertinent Vitals/Pain Pain Assessment Pain Assessment: No/denies pain     Extremity/Trunk Assessment Upper Extremity Assessment Upper Extremity Assessment: Generalized weakness   Lower Extremity Assessment Lower Extremity Assessment: Defer to PT evaluation   Cervical / Trunk Assessment Cervical / Trunk Assessment: Normal   Communication Communication Communication: No apparent difficulties   Cognition Arousal: Alert Behavior During Therapy: WFL for tasks assessed/performed Cognition: No apparent impairments     OT - Cognition Comments: slow processing speed      Following commands: Intact       Cueing  General Comments   Cueing Techniques: Verbal cues  Orthostatics taken. Pt with symptomatic orthostatic hypotension, resolved with seated rest breaks.           Home Living Family/patient expects to be discharged to:: Skilled nursing facility Living Arrangements: Spouse/significant other (Husband, Harold [name]) Available Help at Discharge: Family;Available 24 hours/day (Husband works from home) Type of Home: House Home Access: Ramped entrance     Home Layout: One level     Bathroom Shower/Tub: Tub/shower unit;Sponge bathes at baseline   [employer]: Handicapped height Bathroom Accessibility: Yes   Home Equipment: Pharmacist, hospital (2 wheels);Transport chair;Grab bars - toilet;Grab bars -  tub/shower;BSC/3in1;Wheelchair - manual   Additional Comments: was at [facility] in [place] PTA      Prior Functioning/Environment Prior Level of Function : Needs assist;History of Falls (last six months);Driving       Physical Assist : Mobility (physical);ADLs (physical) Mobility (physical): Bed mobility;Transfers ADLs (physical): Dressing;Toileting;IADLs Mobility Comments: Pt reports it takes 2 facility staff members to get him up and to stand pivot him into w/c. He sometimes will push himself in the w/c. Reports 6 falls in the last 88mo. ADLs Comments: Has assist with LB dressing and toileting    OT Problem List: Decreased strength;Decreased range of motion;Decreased activity tolerance;Impaired balance (sitting and/or standing);Pain;Obesity   OT Treatment/Interventions: Therapeutic exercise;Self-care/ADL training;Energy conservation;DME and/or AE instruction;Therapeutic activities;Patient/family education;Balance training      OT Goals(Current goals can be found in the care plan section)   Acute Rehab OT Goals Patient Stated Goal: To go back to rehab OT Goal Formulation: With patient Time For Goal Achievement: 08/14/23 Potential to Achieve Goals: Good   OT Frequency:  Min 2X/week    Co-evaluation PT/OT/SLP Co-Evaluation/Treatment: Yes Reason for Co-Treatment: For patient/therapist safety;To address functional/ADL transfers PT goals addressed during session: Mobility/safety with mobility;Balance OT goals addressed during session: ADL's and self-care      AM-PAC OT 6 Clicks Daily Activity     Outcome Measure Help from another person eating meals?: None Help from another person taking care of personal grooming?: A Little Help from another person toileting, which includes using toliet, bedpan, or urinal?: A Lot Help from another person  bathing (including washing, rinsing, drying)?: A Lot Help from another person to put on and taking off regular upper body clothing?:  A Lot Help from another person to put on and taking off regular lower body clothing?: A Lot 6 Click Score: 15   End of Session Equipment Utilized During Treatment: Gait belt;Rolling walker (2 wheels) Nurse Communication: Mobility status  Activity Tolerance: Patient limited by fatigue Patient left: in bed;with call bell/phone within reach;with bed alarm set  OT Visit Diagnosis: Unsteadiness on feet (R26.81);Other abnormalities of gait and mobility (R26.89);Muscle weakness (generalized) (M62.81);Repeated falls (R29.6);History of falling (Z91.81);Pain Pain - Right/Left: Right Pain - part of body: Ankle and joints of foot                Time: 1610-9604 OT Time Calculation (min): 17 min Charges:  OT General Charges $OT Visit: 1 Visit OT Evaluation $OT Eval Moderate Complexity: 1 Mod  Delmer Ferraris, OT  Acute Rehabilitation Services Office [phone] Secure chat preferred   Harold [name] 07/31/2023, 4:01 PM

## 2023-07-31 NOTE — Plan of Care (Signed)
  Problem: Coping: Goal: Level of anxiety will decrease Outcome: Progressing   Problem: Elimination: Goal: Will not experience complications related to urinary retention Outcome: Progressing   Problem: Pain Managment: Goal: General experience of comfort will improve and/or be controlled Outcome: Progressing   Problem: Safety: Goal: Ability to remain free from injury will improve Outcome: Progressing

## 2023-08-01 DIAGNOSIS — R9431 Abnormal electrocardiogram [ECG] [EKG]: Secondary | ICD-10-CM | POA: Diagnosis not present

## 2023-08-01 LAB — CBC
HCT: 29.5 % — ABNORMAL LOW (ref 39.0–52.0)
Hemoglobin: 9.8 g/dL — ABNORMAL LOW (ref 13.0–17.0)
MCH: 28.8 pg (ref 26.0–34.0)
MCHC: 33.2 g/dL (ref 30.0–36.0)
MCV: 86.8 fL (ref 80.0–100.0)
Platelets: 189 10*3/uL (ref 150–400)
RBC: 3.4 MIL/uL — ABNORMAL LOW (ref 4.22–5.81)
RDW: 16.3 % — ABNORMAL HIGH (ref 11.5–15.5)
WBC: 4.8 10*3/uL (ref 4.0–10.5)
nRBC: 0 % (ref 0.0–0.2)

## 2023-08-01 LAB — BASIC METABOLIC PANEL WITH GFR
Anion gap: 6 (ref 5–15)
BUN: 13 mg/dL (ref 8–23)
CO2: 24 mmol/L (ref 22–32)
Calcium: 8.1 mg/dL — ABNORMAL LOW (ref 8.9–10.3)
Chloride: 110 mmol/L (ref 98–111)
Creatinine, Ser: 1.81 mg/dL — ABNORMAL HIGH (ref 0.61–1.24)
GFR, Estimated: 40 mL/min — ABNORMAL LOW (ref >60)
Glucose, Bld: 116 mg/dL — ABNORMAL HIGH (ref 70–99)
Potassium: 3.3 mmol/L — ABNORMAL LOW (ref 3.5–5.1)
Sodium: 140 mmol/L (ref 135–145)

## 2023-08-01 MED ORDER — POTASSIUM CHLORIDE CRYS ER 20 MEQ PO TBCR
40.0000 meq | EXTENDED_RELEASE_TABLET | ORAL | Status: AC
  Administered 2023-08-01 (×2): 40 meq via ORAL
  Filled 2023-08-01 (×2): qty 2

## 2023-08-01 MED ORDER — ENSURE PLUS HIGH PROTEIN PO LIQD
237.0000 mL | Freq: Two times a day (BID) | ORAL | Status: DC
  Administered 2023-08-01 – 2023-08-08 (×4): 237 mL via ORAL

## 2023-08-01 NOTE — Progress Notes (Signed)
 Progress Note   Patient: Harold [name] ZOX:[id number] DOB: 10/01/1955 DOA: 07/29/2023     2 DOS: the patient was seen and examined on 08/01/2023     Brief hospital course: From HPI FAARIS ARIZPE is a 68 y.o. male with medical history significant of  Right lower extremity ORIF associated infection s/p I and D on 03/31/23 and 04/02/23 ( cx MRSE and enterobacter claoacae), removal of the hardware by Dr. Curtiss Dowdy 06/21/2023,  chronic infection of the ankle discharged hospital 07/22/2023 to continue ertapenem  and daptomycin  per ID recommendation, chronic hypotension, essential hypertension, COPD, CAD status post CABG, GERD, carotid stenosis stenosis, peripheral vascular disease, chronic venous stasis bilateral lower extremities, hypothyroidism, depression, DM type II, OSA on CPAP, pulmonary hypertension, paroxysmal atrial fibrillation, prolonged QTc, and BPH.   Patient has been seen in [facility]heries clinic today but by Dr.Vu. Right foot doing well no pain; small dimple of an area that is scabbing but no drainage and recommended IV antibiotic until 07/31/2023.  Per ID and Dr. Curtiss Dowdy all hardware not out, rod is in and no immediate plans for removal.    In the ID clinic patient found hypotensive with associated dizziness which is why patient has been sent to ED for evaluation for hypotension and dizziness. Also patient reported that outpatient workup showed low hemoglobin 5.2.  He is also on Eliquis  and Plavix .  Patient does not have any evidence of bleeding.         Assessment and Plan: QTc prolongation-improved  Further workup revealed include EKG Normal sinus rhythm heart rate 69, prolonged QTc 603. TSH within normal limit Continue monitoring electrolytes and correct as needed Patient completed a course of antibiotics today Continue to hold Cymbalta  as well as Zoloft  Plan of care discussed with cardiology Cardiology recommends to hold amiodarone  on discharge QTc improved and patient has been  cleared by cardiologist for discharge however patient pending insurance prior authorization to skilled nursing facility Continue to avoid further QTc prolonging medications include Zofran , Compazine, Atarax , Ativan, Zoloft , Cymbalta , from patient's home medication list.     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 Cardiology has recommended holding amiodarone  at discharge in the setting of QT prolongation -Continue Eliquis  5 mg twice daily Continue Coreg  3.125 mg twice daily.     History of CAD s/p CABG - Continue Coreg , Lipitor and Eliquis      History of PAD Continue Lipitor and Eliquis    OSA on CPAP Continue CPAP at bedtime.   Chronic normocytic anemia Monitor CBC   Non-insulin -dependent DM type II History of DM type II diet controlled.  A1c within normal range.  Not currently on insulin  at home.     Generalized anxiety disorder History of depression Continue to hold Cymbalta  and Zoloft  in the setting of prolonged QTc. -Also need to avoid benzodiazepine Atarax  in the setting of prolonged QTc.   BPH Holding Prozac in the setting of QTc prolongation   History of COPD - Stable.  Continue to check pulse ox. - Continue ipratropium as needed for wheezing shortness of breath.  Will avoid  albuterol  in the setting of prolonged QTc.     DVT prophylaxis:  Eliquis    Code Status:  Full Code   Diet: Heart healthy carb modified diet     Consults: Cardiology  and infectious disease     Subjective:  Patient seen and examined at bedside this morning Denies nausea vomiting abdominal pain chest pain Patient still appears weak requiring skilled nursing facility placement [facility] on board and working on insurance authorization   Physical Exam Vitals and nursing note reviewed.  Constitutional:      General: He is not in acute distress.    Appearance: He is obese. He is not ill-appearing.  HENT:     Mouth/Throat:     Mouth: Mucous membranes are moist.  Eyes:     Pupils: Pupils are equal, round, and reactive to light.  Cardiovascular:     Rate and Rhythm: Normal rate and regular rhythm.  Pulmonary:     Effort: Pulmonary effort is normal.     Breath sounds: Normal breath sounds.  Abdominal:     Palpations: Abdomen is soft.  Musculoskeletal:     Cervical back: Neck supple.  Neurological:     Mental Status: He is alert and oriented to person, place, and time.  Psychiatric:        Mood and Affect: Mood normal.    Family Communication: Present at bedside   Status is: Inpatient   Time spent: 39 minutes   Data Reviewed:        Latest Ref Rng & Units 08/01/2023    5:41 AM 07/31/2023    3:13 AM 07/30/2023    4:14 AM  CBC  WBC 4.0 - 10.5 K/uL 4.8  5.2  6.4   Hemoglobin 13.0 - 17.0 g/dL 9.8  9.5  16.1   Hematocrit 39.0 - 52.0 % 29.5  28.9  31.0   Platelets 150 - 400 K/uL 189  197  239        Latest Ref Rng & Units 08/01/2023    5:41 AM 07/31/2023    3:13 AM 07/30/2023    4:14 AM  BMP  Glucose 70 - 99 mg/dL 096  94  045   BUN 8 - 23 mg/dL 13  17  22    Creatinine 0.61 - 1.24 mg/dL 4.09  8.11  9.14   Sodium 135 - 145 mmol/L 140  137  138   Potassium 3.5 - 5.1 mmol/L 3.3  3.4  3.0   Chloride 98 - 111 mmol/L 110  109  107   CO2 22 - 32 mmol/L 24  20  20    Calcium  8.9 - 10.3 mg/dL 8.1  7.9  8.3       Vitals:   08/01/23 0329 08/01/23 0726 08/01/23 0924 08/01/23 1225  BP: (!) 171/68 (!) 181/78 (!) 157/73 (!) 173/69  Pulse: (!) 56 62 61    Resp: 13 16    Temp: 98.1 F (36.7 C) 97.7 F (36.5 C)    TempSrc: Oral Oral    SpO2: 100% 97%    Weight:      Height:         Disposition: Pending insurance prior authorization and subsequent placement to skilled nursing facility  Author: Ezzard Holms, MD 08/01/2023 4:07 PM  For on call review [email or web address].

## 2023-08-01 NOTE — Plan of Care (Signed)

## 2023-08-01 NOTE — [place] FL2 (Signed)
 [place]  MEDICAID FL2 LEVEL OF CARE FORM     IDENTIFICATION  Patient Name: Harold [name] Birthdate: [birth year] Sex: male Admission Date (Current Location): 07/29/2023  [facility] and [place] Number:  Producer, television/film/video and Address:  [facility], [address] [place], [place] [address]      Provider Number: [id number]  Attending Physician Name and Address:  Ezzard Holms, MD  Relative Name and Phone Number:       Current Level of Care: Hospital Recommended Level of Care: Skilled Nursing Facility Prior Approval Number:    Date Approved/Denied:   PASRR Number:    Discharge Plan:      Current Diagnoses: Patient Active Problem List   Diagnosis Date Noted   Hypokalemia 07/30/2023   History of COPD 07/30/2023   History of peripheral vascular disease 07/30/2023   Non-insulin  dependent type 2 diabetes mellitus (HCC) 07/30/2023   BPH (benign prostatic hyperplasia) 07/30/2023   Major depressive disorder, recurrent episode, moderate (HCC) 07/19/2023   PICC (peripherally inserted central catheter) in place 07/18/2023   Dizziness 07/17/2023   Hypotension 07/17/2023   Infection of lower extremity associated with hardware (HCC) 06/19/2023   Hyponatremia 06/19/2023   Open wound of right ankle 06/18/2023   Protein-calorie malnutrition, severe 05/21/2023   Open right ankle fracture, sequela 05/12/2023   Obesity, Class II, BMI 35-39.9 04/23/2023   Osteomyelitis (HCC) 04/17/2023   Cellulitis of right lower extremity 03/28/2023   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, right (HCC) 03/28/2023   Abscess of skin of right ankle 03/28/2023   PAD (peripheral artery disease) (HCC) 03/28/2023   Vitamin D  deficiency 01/07/2023   QT prolongation 01/02/2023   Acute renal failure superimposed on stage 3b chronic kidney disease (HCC) 01/02/2023   Normocytic anemia 01/02/2023   Paroxysmal atrial fibrillation (HCC) 01/02/2023   Uncontrolled type 2  diabetes mellitus with hyperglycemia, with long-term current use of insulin  (HCC) 01/02/2023   Hypothyroidism 01/02/2023   CKD stage 3b, GFR 30-44 ml/min (HCC) 11/07/2022   CAD in native artery 11/07/2022   PVD (peripheral vascular disease) (HCC) 11/06/2022   Hypercoagulable state due to persistent atrial fibrillation (HCC) 02/19/2022   Persistent atrial fibrillation (HCC) 01/28/2022   Lumbar stenosis with neurogenic claudication 07/26/2020   Diabetic neuropathy (HCC) 08/24/2019   Trigger thumb of left hand 09/02/2018   Atherosclerosis of native artery of both lower extremities with intermittent claudication (HCC) 05/12/2018   Pain of left hand 03/26/2018   Trigger finger 03/03/2017   Pain in finger of left hand 03/03/2017   Snoring 08/20/2016   Morbid (severe) obesity due to excess calories (HCC) 04/18/2016   Venous stasis of both lower extremities - with edema 02/25/2015   Right-sided chest wall pain 05/08/2014   Gastroesophageal reflux disease without esophagitis 10/10/2013   Chronic heart failure with preserved ejection fraction (HFpEF) (HCC) 06/27/2013   COPD (chronic obstructive pulmonary disease) (HCC) 06/17/2013   Restrictive lung disease 06/17/2013   Obesity, Class III, BMI 40-49.9 (morbid obesity) 09/01/2012   Generalized weakness 09/01/2012   Chronic renal disease, stage IV (HCC) 04/30/2012   OSA on CPAP 04/29/2012   Pulmonary hypertension, 04/29/2012   Dyspnea on exertion   04/26/2012   CAD, LAD/CFX DES 04/28/12- staged RCA DES 04/29/12 after pt declined CABG, cath 05/14/13 stable CAD medical therapy    Carotid artery stenosis without cerebral infarction, bilateral 11/07/2011   Diabetes mellitus type 2 with neurological manifestations (HCC) 04/29/2011   Essential hypertension  04/29/2011   Neuropathy 04/29/2011   Hyperlipidemia associated with type 2 diabetes mellitus (HCC) 04/29/2011   Anxiety and depression 04/29/2011    Orientation RESPIRATION BLADDER Height &  Weight     Self, Time, Situation, Place  Normal Continent, External catheter Weight: 229 lb 15 oz (104.3 kg) Height:  6' 1 (185.4 cm)  BEHAVIORAL SYMPTOMS/MOOD NEUROLOGICAL BOWEL NUTRITION STATUS      Incontinent Diet (see dc summary)  AMBULATORY STATUS COMMUNICATION OF NEEDS Skin   Extensive Assist Verbally Other (Comment) (Wound / Incision - Foot Right;Posterior ; Wound / Incision -  Non-pressure wound Heel Right    Continue)                       Personal Care Assistance Level of Assistance  Bathing, Feeding, Dressing Bathing Assistance: Maximum assistance Feeding assistance: Independent Dressing Assistance: Maximum assistance     Functional Limitations Info    Sight Info: Impaired (eyeglasses) Hearing Info: Adequate Speech Info: Adequate    SPECIAL CARE FACTORS FREQUENCY  PT (By licensed PT), OT (By licensed OT)     PT Frequency: 5x/week OT Frequency: 5x/week            Contractures Contractures Info: Not present    Additional Factors Info  Code Status, Allergies, Psychotropic Code Status Info: full code Allergies Info: Beta-lactamase Inhibitors (beta-lactam, metformin  and related, Wellbutrin (bupropion), sulfa antibiotics, Tetracyclines & Related, General Anesthesia - sometimes causes nausea and vomiting * OK to use scopolamine  patch Psychotropic Info: gabapentin          Current Medications (08/01/2023):  This is the current hospital active medication list Current Facility-Administered Medications  Medication Dose Route Frequency Provider Last Rate Last Admin   0.9 %  sodium chloride  infusion   Intravenous Continuous Ezzard Holms, MD 50 mL/hr at 08/01/23 0943 New Bag at 08/01/23 0943   acetaminophen  (TYLENOL ) tablet 650 mg  650 mg Oral Q6H PRN Sundil, Subrina, MD       Or   acetaminophen  (TYLENOL ) suppository 650 mg  650 mg Rectal Q6H PRN Sundil, Subrina, MD       allopurinol  (ZYLOPRIM ) tablet 50 mg  50 mg Oral Daily Sundil, Subrina, MD   50 mg at  08/01/23 6440   apixaban  (ELIQUIS ) tablet 5 mg  5 mg Oral BID Sundil, Subrina, MD   5 mg at 08/01/23 3474   atorvastatin  (LIPITOR) tablet 40 mg  40 mg Oral Daily Sundil, Subrina, MD   40 mg at 08/01/23 2595   carvedilol  (COREG ) tablet 3.125 mg  3.125 mg Oral BID Sundil, Subrina, MD   3.125 mg at 08/01/23 6387   Chlorhexidine  Gluconate Cloth 2 % PADS 6 each  6 each Topical Daily Ezzard Holms, MD   6 each at 07/31/23 0920   clopidogrel  (PLAVIX ) tablet 75 mg  75 mg Oral q morning Sundil, Subrina, MD   75 mg at 08/01/23 5643   ezetimibe  (ZETIA ) tablet 10 mg  10 mg Oral QHS Sundil, Subrina, MD   10 mg at 07/31/23 2104   feeding supplement (ENSURE PLUS HIGH PROTEIN) liquid 237 mL  237 mL Oral BID BM Djan, Prince T, MD       ferrous sulfate  tablet 325 mg  325 mg Oral Once per day on Monday Wednesday Friday Ulice Gamer, Subrina, MD   325 mg at 08/01/23 3295   finasteride  (PROSCAR ) tablet 5 mg  5 mg Oral Daily Sundil, Subrina, MD   5 mg at 08/01/23 [phone]  gabapentin  (NEURONTIN ) capsule 300 mg  300 mg Oral QHS Sundil, Subrina, MD   300 mg at 07/31/23 2104   hydrALAZINE  (APRESOLINE ) injection 10 mg  10 mg Intravenous Q6H PRN Sundil, Subrina, MD   10 mg at 07/30/23 0412   ipratropium (ATROVENT ) nebulizer solution 0.5 mg  0.5 mg Nebulization Q4H PRN Sundil, Subrina, MD       levothyroxine  (SYNTHROID ) tablet 88 mcg  88 mcg Oral QAC breakfast Sundil, Subrina, MD   88 mcg at 08/01/23 0548   methocarbamol  (ROBAXIN ) tablet 500 mg  500 mg Oral Q6H PRN Sundil, Subrina, MD       Oral care mouth rinse  15 mL Mouth Rinse PRN Ezzard Holms, MD       pantoprazole  (PROTONIX ) EC tablet 40 mg  40 mg Oral Daily Sundil, Subrina, MD   40 mg at 08/01/23 1610   potassium chloride  SA (KLOR-CON  M) CR tablet 40 mEq  40 mEq Oral Q4H Ezzard Holms, MD       senna-docusate (Senokot-S) tablet 1 tablet  1 tablet Oral QHS PRN Sundil, Subrina, MD       sodium chloride  flush (NS) 0.9 % injection 10-40 mL  10-40 mL Intracatheter Q12H Alvenia Aus T, MD   10 mL at 07/31/23 2104   sodium chloride  flush (NS) 0.9 % injection 10-40 mL  10-40 mL Intracatheter PRN Ezzard Holms, MD       sodium chloride  flush (NS) 0.9 % injection 3 mL  3 mL Intravenous Q12H Sundil, Subrina, MD   3 mL at 07/31/23 2105   sodium chloride  flush (NS) 0.9 % injection 3 mL  3 mL Intravenous Q12H Sundil, Subrina, MD   3 mL at 07/31/23 2105   sodium chloride  flush (NS) 0.9 % injection 3 mL  3 mL Intravenous PRN Sundil, Subrina, MD       trimethobenzamide  (TIGAN ) injection 200 mg  200 mg Intramuscular Q8H PRN Sundil, Subrina, MD         Discharge Medications: Please see discharge summary for a list of discharge medications.  Relevant Imaging Results:  Relevant Lab Results:   Additional Information SSN; [id number]  Arron Big, LCSWA

## 2023-08-01 NOTE — TOC Initial Note (Addendum)
 Transition of Care [facility]) - Initial/Assessment Note    Patient Details  Name: Harold [name] MRN: [id number] Date of Birth: March 29, 1955  [facility]) CM/SW Contact:    Arron Big, LCSWA Phone Number: 08/01/2023, 11:20 AM  Clinical Narrative:    Patient came from [facility] SNF. CSW spoke with patient about PT recs stating SNF and he stated he wants to return to [facility]. [facility] agreeable to taking patient back and stated he does not have to worry about copays at this time. CSW left VM for patient spouse.   12:36 PM CSW spoke with [name] and he is agreeable with patient returning to [facility]. Auth started and is pending for [facility], auth id - [id number].   3:09 PM Insurance auth pending.   TOC will continue to follow.    Expected Discharge Plan: Skilled Nursing Facility Barriers to Discharge: Continued Medical Work up, English as a second language teacher   Patient Goals and CMS Choice Patient states their goals for this hospitalization and ongoing recovery are:: Return to SNF          Expected Discharge Plan and Services In-house Referral: Clinical Social Work     Living arrangements for the past 2 months: Skilled Holiday representative                                      Prior Living Arrangements/Services Living arrangements for the past 2 months: Skilled Nursing Facility Lives with:: Facility Resident Patient language and need for interpreter reviewed:: Yes Do you feel safe going back to the place where you live?: Yes      Need for Family Participation in Patient Care: Yes (Comment) Care giver support system in place?: Yes (comment)   Criminal Activity/Legal Involvement Pertinent to Current Situation/Hospitalization: No - Comment as needed  Activities of Daily Living   ADL Screening (condition at time of admission) Independently performs ADLs?: No Does the patient have a NEW difficulty with bathing/dressing/toileting/self-feeding that is expected to last >3 days?: Yes  (Initiates electronic notice to provider for possible OT consult) Does the patient have a NEW difficulty with getting in/out of bed, walking, or climbing stairs that is expected to last >3 days?: Yes (Initiates electronic notice to provider for possible PT consult) Does the patient have a NEW difficulty with communication that is expected to last >3 days?: No Is the patient deaf or have difficulty hearing?: No Does the patient have difficulty seeing, even when wearing glasses/contacts?: No Does the patient have difficulty concentrating, remembering, or making decisions?: No  Permission Sought/Granted Permission sought to share information with : Facility Medical sales representative, Family Supports Permission granted to share information with : Yes, Verbal Permission Granted  Share Information with NAME: Benton-Elliot,George  Permission granted to share info w AGENCY: SNF  Permission granted to share info w Relationship: Spouse  Permission granted to share info w Contact Information: [phone]  Emotional Assessment Appearance:: Appears stated age Attitude/Demeanor/Rapport: Engaged Affect (typically observed): Pleasant Orientation: : Oriented to Self, Oriented to Place, Oriented to  Time, Oriented to Situation Alcohol / Substance Use: Not Applicable Psych Involvement: No (comment)  Admission diagnosis:  Hypocalcemia [E83.51] Prolonged Q-T interval on ECG [R94.31] QT prolongation [R94.31] Patient Active Problem List   Diagnosis Date Noted   Hypokalemia 07/30/2023   History of COPD 07/30/2023   History of peripheral vascular disease 07/30/2023   Non-insulin  dependent type 2 diabetes mellitus (HCC) 07/30/2023   BPH (  benign prostatic hyperplasia) 07/30/2023   Major depressive disorder, recurrent episode, moderate (HCC) 07/19/2023   PICC (peripherally inserted central catheter) in place 07/18/2023   Dizziness 07/17/2023   Hypotension 07/17/2023   Infection of lower extremity associated  with hardware (HCC) 06/19/2023   Hyponatremia 06/19/2023   Open wound of right ankle 06/18/2023   Protein-calorie malnutrition, severe 05/21/2023   Open right ankle fracture, sequela 05/12/2023   Obesity, Class II, BMI 35-39.9 04/23/2023   Osteomyelitis (HCC) 04/17/2023   Cellulitis of right lower extremity 03/28/2023   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, right (HCC) 03/28/2023   Abscess of skin of right ankle 03/28/2023   PAD (peripheral artery disease) (HCC) 03/28/2023   Vitamin D  deficiency 01/07/2023   QT prolongation 01/02/2023   Acute renal failure superimposed on stage 3b chronic kidney disease (HCC) 01/02/2023   Normocytic anemia 01/02/2023   Paroxysmal atrial fibrillation (HCC) 01/02/2023   Uncontrolled type 2 diabetes mellitus with hyperglycemia, with long-term current use of insulin  (HCC) 01/02/2023   Hypothyroidism 01/02/2023   CKD stage 3b, GFR 30-44 ml/min (HCC) 11/07/2022   CAD in native artery 11/07/2022   PVD (peripheral vascular disease) (HCC) 11/06/2022   Hypercoagulable state due to persistent atrial fibrillation (HCC) 02/19/2022   Persistent atrial fibrillation (HCC) 01/28/2022   Lumbar stenosis with neurogenic claudication 07/26/2020   Diabetic neuropathy (HCC) 08/24/2019   Trigger thumb of left hand 09/02/2018   Atherosclerosis of native artery of both lower extremities with intermittent claudication (HCC) 05/12/2018   Pain of left hand 03/26/2018   Trigger finger 03/03/2017   Pain in finger of left hand 03/03/2017   Snoring 08/20/2016   Morbid (severe) obesity due to excess calories (HCC) 04/18/2016   Venous stasis of both lower extremities - with edema 02/25/2015   Right-sided chest wall pain 05/08/2014   Gastroesophageal reflux disease without esophagitis 10/10/2013   Chronic heart failure with preserved ejection fraction (HFpEF) (HCC) 06/27/2013   COPD (chronic obstructive pulmonary disease) (HCC) 06/17/2013   Restrictive lung disease  06/17/2013   Obesity, Class III, BMI 40-49.9 (morbid obesity) 09/01/2012   Generalized weakness 09/01/2012   Chronic renal disease, stage IV (HCC) 04/30/2012   OSA on CPAP 04/29/2012   Pulmonary hypertension, 04/29/2012   Dyspnea on exertion   04/26/2012   CAD, LAD/CFX DES 04/28/12- staged RCA DES 04/29/12 after pt declined CABG, cath 05/14/13 stable CAD medical therapy    Carotid artery stenosis without cerebral infarction, bilateral 11/07/2011   Diabetes mellitus type 2 with neurological manifestations (HCC) 04/29/2011   Essential hypertension 04/29/2011   Neuropathy 04/29/2011   Hyperlipidemia associated with type 2 diabetes mellitus (HCC) 04/29/2011   Anxiety and depression 04/29/2011   PCP:  Tita Form, MD Pharmacy:   [phone] [place] [place] - [address] [place] [address] Phone: [phone] Fax: [phone]     Social Drivers of Health (SDOH) Social History: SDOH Screenings   Food Insecurity: No Food Insecurity (07/31/2023)  Housing: High Risk (07/31/2023)  Transportation Needs: No Transportation Needs (07/31/2023)  Utilities: Not At Risk (07/31/2023)  Depression (PHQ2-9): Low Risk  (07/29/2023)  Social Connections: Moderately Isolated (07/31/2023)  Tobacco Use: Medium Risk (07/30/2023)   SDOH Interventions:     Readmission Risk Interventions     No data to display

## 2023-08-02 DIAGNOSIS — R9431 Abnormal electrocardiogram [ECG] [EKG]: Secondary | ICD-10-CM | POA: Diagnosis not present

## 2023-08-02 LAB — CBC
HCT: 32.1 % — ABNORMAL LOW (ref 39.0–52.0)
Hemoglobin: 10.6 g/dL — ABNORMAL LOW (ref 13.0–17.0)
MCH: 29 pg (ref 26.0–34.0)
MCHC: 33 g/dL (ref 30.0–36.0)
MCV: 87.9 fL (ref 80.0–100.0)
Platelets: 204 10*3/uL (ref 150–400)
RBC: 3.65 MIL/uL — ABNORMAL LOW (ref 4.22–5.81)
RDW: 16.6 % — ABNORMAL HIGH (ref 11.5–15.5)
WBC: 5.6 10*3/uL (ref 4.0–10.5)
nRBC: 0 % (ref 0.0–0.2)

## 2023-08-02 LAB — BASIC METABOLIC PANEL WITH GFR
Anion gap: 6 (ref 5–15)
BUN: 12 mg/dL (ref 8–23)
CO2: 21 mmol/L — ABNORMAL LOW (ref 22–32)
Calcium: 8.1 mg/dL — ABNORMAL LOW (ref 8.9–10.3)
Chloride: 113 mmol/L — ABNORMAL HIGH (ref 98–111)
Creatinine, Ser: 1.71 mg/dL — ABNORMAL HIGH (ref 0.61–1.24)
GFR, Estimated: 43 mL/min — ABNORMAL LOW (ref >60)
Glucose, Bld: 114 mg/dL — ABNORMAL HIGH (ref 70–99)
Potassium: 4.2 mmol/L (ref 3.5–5.1)
Sodium: 140 mmol/L (ref 135–145)

## 2023-08-02 NOTE — TOC Progression Note (Signed)
 Transition of Care [facility]) - Initial/Assessment Note    Patient Details  Name: Harold [name] MRN: [id number] Date of Birth: 03-Aug-1955  Transition [facility]) CM/SW Contact:    Maya Sparrow, LCSW Phone Number: 08/02/2023, 12:37 PM  Clinical Narrative:                 CSW contacted [employer] to inquire about the status of insurance authorization.  The authorization for SNF is still pending at this time.  Clinicals have been received.  TOC will continue to follow.    Expected Discharge Plan: Skilled Nursing Facility Barriers to Discharge: Continued Medical Work up, English as a second language teacher   Patient Goals and CMS Choice Patient states their goals for this hospitalization and ongoing recovery are:: Return to SNF          Expected Discharge Plan and Services In-house Referral: Clinical Social Work     Living arrangements for the past 2 months: Skilled Holiday representative                                      Prior Living Arrangements/Services Living arrangements for the past 2 months: Skilled Nursing Facility Lives with:: Facility Resident Patient language and need for interpreter reviewed:: Yes Do you feel safe going back to the place where you live?: Yes      Need for Family Participation in Patient Care: Yes (Comment) Care giver support system in place?: Yes (comment)   Criminal Activity/Legal Involvement Pertinent to Current Situation/Hospitalization: No - Comment as needed  Activities of Daily Living   ADL Screening (condition at time of admission) Independently performs ADLs?: No Does the patient have a NEW difficulty with bathing/dressing/toileting/self-feeding that is expected to last >3 days?: Yes (Initiates electronic notice to provider for possible OT consult) Does the patient have a NEW difficulty with getting in/out of bed, walking, or climbing stairs that is expected to last >3 days?: Yes (Initiates electronic notice to provider for possible PT  consult) Does the patient have a NEW difficulty with communication that is expected to last >3 days?: No Is the patient deaf or have difficulty hearing?: No Does the patient have difficulty seeing, even when wearing glasses/contacts?: No Does the patient have difficulty concentrating, remembering, or making decisions?: No  Permission Sought/Granted Permission sought to share information with : Facility Medical sales representative, Family Supports Permission granted to share information with : Yes, Verbal Permission Granted  Share Information with NAME: Benton-Elliot,George  Permission granted to share info w AGENCY: SNF  Permission granted to share info w Relationship: Spouse  Permission granted to share info w Contact Information: [phone]  Emotional Assessment Appearance:: Appears stated age Attitude/Demeanor/Rapport: Engaged Affect (typically observed): Pleasant Orientation: : Oriented to Self, Oriented to Place, Oriented to  Time, Oriented to Situation Alcohol / Substance Use: Not Applicable Psych Involvement: No (comment)  Admission diagnosis:  Hypocalcemia [E83.51] Prolonged Q-T interval on ECG [R94.31] QT prolongation [R94.31] Patient Active Problem List   Diagnosis Date Noted   Hypokalemia 07/30/2023   History of COPD 07/30/2023   History of peripheral vascular disease 07/30/2023   Non-insulin  dependent type 2 diabetes mellitus (HCC) 07/30/2023   BPH (benign prostatic hyperplasia) 07/30/2023   Major depressive disorder, recurrent episode, moderate (HCC) 07/19/2023   PICC (peripherally inserted central catheter) in place 07/18/2023   Dizziness 07/17/2023   Hypotension 07/17/2023   Infection of lower extremity associated with hardware (HCC) 06/19/2023  Hyponatremia 06/19/2023   Open wound of right ankle 06/18/2023   Protein-calorie malnutrition, severe 05/21/2023   Open right ankle fracture, sequela 05/12/2023   Obesity, Class II, BMI 35-39.9 04/23/2023   Osteomyelitis  (HCC) 04/17/2023   Cellulitis of right lower extremity 03/28/2023   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, right (HCC) 03/28/2023   Abscess of skin of right ankle 03/28/2023   PAD (peripheral artery disease) (HCC) 03/28/2023   Vitamin D  deficiency 01/07/2023   QT prolongation 01/02/2023   Acute renal failure superimposed on stage 3b chronic kidney disease (HCC) 01/02/2023   Normocytic anemia 01/02/2023   Paroxysmal atrial fibrillation (HCC) 01/02/2023   Uncontrolled type 2 diabetes mellitus with hyperglycemia, with long-term current use of insulin  (HCC) 01/02/2023   Hypothyroidism 01/02/2023   CKD stage 3b, GFR 30-44 ml/min (HCC) 11/07/2022   CAD in native artery 11/07/2022   PVD (peripheral vascular disease) (HCC) 11/06/2022   Hypercoagulable state due to persistent atrial fibrillation (HCC) 02/19/2022   Persistent atrial fibrillation (HCC) 01/28/2022   Lumbar stenosis with neurogenic claudication 07/26/2020   Diabetic neuropathy (HCC) 08/24/2019   Trigger thumb of left hand 09/02/2018   Atherosclerosis of native artery of both lower extremities with intermittent claudication (HCC) 05/12/2018   Pain of left hand 03/26/2018   Trigger finger 03/03/2017   Pain in finger of left hand 03/03/2017   Snoring 08/20/2016   Morbid (severe) obesity due to excess calories (HCC) 04/18/2016   Venous stasis of both lower extremities - with edema 02/25/2015   Right-sided chest wall pain 05/08/2014   Gastroesophageal reflux disease without esophagitis 10/10/2013   Chronic heart failure with preserved ejection fraction (HFpEF) (HCC) 06/27/2013   COPD (chronic obstructive pulmonary disease) (HCC) 06/17/2013   Restrictive lung disease 06/17/2013   Obesity, Class III, BMI 40-49.9 (morbid obesity) 09/01/2012   Generalized weakness 09/01/2012   Chronic renal disease, stage IV (HCC) 04/30/2012   OSA on CPAP 04/29/2012   Pulmonary hypertension, 04/29/2012   Dyspnea on exertion   04/26/2012    CAD, LAD/CFX DES 04/28/12- staged RCA DES 04/29/12 after pt declined CABG, cath 05/14/13 stable CAD medical therapy    Carotid artery stenosis without cerebral infarction, bilateral 11/07/2011   Diabetes mellitus type 2 with neurological manifestations (HCC) 04/29/2011   Essential hypertension 04/29/2011   Neuropathy 04/29/2011   Hyperlipidemia associated with type 2 diabetes mellitus (HCC) 04/29/2011   Anxiety and depression 04/29/2011   PCP:  Tita Form, MD Pharmacy[facility]   [phone] [place], [place] - [address] [place] [address] Phone: [phone] Fax: [phone]     Social Drivers of Health (SDOH) Social History: SDOH Screenings   Food Insecurity: No Food Insecurity (07/31/2023)  Housing: High Risk (07/31/2023)  Transportation Needs: No Transportation Needs (07/31/2023)  Utilities: Not At Risk (07/31/2023)  Depression (PHQ2-9): Low Risk  (07/29/2023)  Social Connections: Moderately Isolated (07/31/2023)  Tobacco Use: Medium Risk (07/30/2023)   SDOH Interventions:     Readmission Risk Interventions     No data to display

## 2023-08-02 NOTE — Progress Notes (Signed)
 PROGRESS NOTE    Harold KENDRA  [name]:[id number] DOB: 08-24-1955 DOA: 07/29/2023 PCP: Harold Form, MD   Brief Narrative:  Harold [name] is a 68 y.o. male with medical history significant of chronic hypotension, essential hypertension, COPD, CAD status post CABG, GERD, carotid stenosis stenosis, peripheral vascular disease, chronic venous stasis bilateral lower extremities, hypothyroidism, depression, DM type II, OSA on CPAP, pulmonary hypertension, paroxysmal atrial fibrillation, prolonged QTc, and BPH.  Prior right lower extremity ORIF associated infection s/p I&D on 03/31/23 and 04/02/23 ( cx MRSE and enterobacter claoacae), removal of the hardware by Dr. Curtiss Dowdy 06/21/2023,  chronic infection of the ankle discharged hospital 07/22/2023 to continue ertapenem  and daptomycin  per ID recommendation through 07/31/2023. 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)   Pulmonary hypertension,   Essential hypertension   OSA on CPAP   Paroxysmal atrial fibrillation (HCC)   Hypothyroidism   PAD (peripheral artery disease) (HCC)   Normocytic anemia   PVD (peripheral vascular disease) (HCC)   Neuropathy   Restrictive lung disease   Persistent atrial fibrillation (HCC)   Major depressive disorder, recurrent episode, moderate (HCC)   Hypokalemia   History of COPD   History of peripheral vascular disease   Non-insulin  dependent type 2 diabetes mellitus (HCC)   BPH (benign prostatic hyperplasia)  QTc prolongation-improved TSH within normal limit Patient completed a course of antibiotics Continue to hold Cymbalta  as well as Zoloft  Cardiology  recommends to hold amiodarone  on discharge QTc improved and patient has been cleared by cardiologist for discharge Continue to avoid further QTc prolonging medications include Zofran , Compazine, Atarax , Ativan, Zoloft , Cymbalta , from patient's home medication list.   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 Cardiology has recommended holding amiodarone  at discharge in the setting of QT prolongation Continue Eliquis  5 mg twice daily Continue Coreg  3.125 mg twice daily.   History of CAD s/p CABG Continue Coreg , Lipitor and Eliquis   History of PAD Continue Lipitor and Eliquis    OSA on CPAP Continue CPAP at bedtime.   Chronic normocytic anemia Monitor CBC   Non-insulin -dependent DM type II History of DM type II diet controlled.  A1c within normal range.  Not currently on insulin  at home.   Generalized anxiety disorder History of depression Continue to hold Cymbalta  and Zoloft  in the setting of prolonged QTc. Also need to avoid benzodiazepine Atarax  in the setting of prolonged QTc.   BPH Holding Prozac in the setting of QTc prolongation   History of COPD Stable.  Continue to check pulse ox. Continue ipratropium as needed for wheezing shortness of breath.  Will avoid albuterol  in the setting of prolonged QTc.     DVT prophylaxis: SCDs Start: 07/30/23 0044 Place TED hose Start: 07/30/23 0044 apixaban  (ELIQUIS ) tablet 5 mg  Code Status:   Code Status: Full  Code Family Communication: None present  Status is: Inpatient  Dispo: The patient is from: Home              Anticipated d/c is to: SNF              Anticipated d/c date is: Imminent              Patient currently is medically stable for discharge  Consultants:  Cardiology  Procedures:  None  Antimicrobials:  Completed  Subjective: No acute issues or events overnight  Objective: Vitals:   08/01/23 2038 08/01/23 2057 08/02/23 0101 08/02/23 0450  BP: (!) 157/66  (!) 153/71 (!) 169/67  Pulse: (!) 58  (!) 58 65  Resp: 20 16 20 20   Temp: 97.7 F (36.5 C)  97.7 F (36.5 C) 97.6 F (36.4 C)  TempSrc: Oral  Oral Oral  SpO2: 100%  100% 100%  Weight:    104.9 kg  Height:        Intake/Output Summary (Last 24 hours) at 08/02/2023 0806 Last data filed at 08/02/2023 0452 Gross per 24 hour  Intake --  Output 400 ml  Net -400 ml   Filed Weights   07/29/23 2029 07/30/23 1538 08/02/23 0450  Weight: 102 kg 104.3 kg 104.9 kg    Examination:  General:  Pleasantly resting in bed, No acute distress. HEENT:  Normocephalic atraumatic.  Sclerae nonicteric, noninjected.  Extraocular movements intact bilaterally. Neck:  Without mass or deformity.  Trachea is midline. Lungs:  Clear to auscultate bilaterally without rhonchi, wheeze, or rales. Heart:  Regular rate and rhythm.  Without murmurs, rubs, or gallops. Abdomen:  Soft, nontender, nondistended.  Without guarding or rebound. Extremities: Without cyanosis, clubbing, edema, or obvious deformity. Skin:  Warm and dry, no erythema.  Data Reviewed: I have personally reviewed following labs and imaging studies  CBC: Recent Labs  Lab 07/29/23 2043 07/30/23 0414 07/31/23 0313 08/01/23 0541 08/02/23 0244  WBC 6.7 6.4 5.2 4.8 5.6  NEUTROABS 3.9  --   --   --   --   HGB 10.2* 10.2* 9.5* 9.8* 10.6*  HCT 29.7* 31.0* 28.9* 29.5* 32.1*  MCV 87.1 88.1 87.0 86.8 87.9  PLT 245 239 197 189 204   Basic Metabolic Panel: Recent Labs   Lab 07/29/23 2043 07/30/23 0414 07/31/23 0313 08/01/23 0541 08/02/23 0244  NA 139 138 137 140 140  K 3.3* 3.0* 3.4* 3.3* 4.2  CL 108 107 109 110 113*  CO2 21* 20* 20* 24 21*  GLUCOSE 94 107* 94 116* 114*  BUN 22 22 17 13 12   CREATININE 2.34* 2.37* 2.08* 1.81* 1.71*  CALCIUM  7.9* 8.3* 7.9* 8.1* 8.1*  MG  --  2.4  --   --   --    GFR: Estimated Creatinine Clearance: 51.8 mL/min (A) (by C-G formula based on SCr of 1.71 mg/dL (H)).  Liver Function Tests: Recent Labs  Lab 07/29/23 2043 07/30/23 0414  AST 39 37  ALT 26 25  ALKPHOS 118 115  BILITOT 0.8 1.0  PROT 5.1* 5.1*  ALBUMIN  1.9* 2.0*   CBG: Recent Labs  Lab 07/30/23 1047  GLUCAP 106*    No results found for this or any previous visit (from the past 240 hours).   Radiology Studies: No results found.  Scheduled Meds:  allopurinol   50 mg Oral Daily   apixaban   5 mg Oral BID   atorvastatin   40 mg Oral Daily  carvedilol   3.125 mg Oral BID   Chlorhexidine  Gluconate Cloth  6 each Topical Daily   clopidogrel   75 mg Oral q morning   ezetimibe   10 mg Oral QHS   feeding supplement  237 mL Oral BID BM   ferrous sulfate   325 mg Oral Once per day on Monday Wednesday Friday   finasteride   5 mg Oral Daily   gabapentin   300 mg Oral QHS   levothyroxine   88 mcg Oral QAC breakfast   pantoprazole   40 mg Oral Daily   sodium chloride  flush  10-40 mL Intracatheter Q12H   sodium chloride  flush  3 mL Intravenous Q12H   sodium chloride  flush  3 mL Intravenous Q12H   Continuous Infusions:   LOS: 3 days   Time spent:  Haydee Lipa, DO [facility]  If 7PM-7AM, please contact night-coverage [email or web address]  08/02/2023, 8:06 AM

## 2023-08-02 NOTE — Plan of Care (Signed)

## 2023-08-03 DIAGNOSIS — R9431 Abnormal electrocardiogram [ECG] [EKG]: Secondary | ICD-10-CM | POA: Diagnosis not present

## 2023-08-03 MED ORDER — CARVEDILOL 6.25 MG PO TABS
6.2500 mg | ORAL_TABLET | Freq: Two times a day (BID) | ORAL | Status: DC
  Administered 2023-08-03 – 2023-08-08 (×11): 6.25 mg via ORAL
  Filled 2023-08-03 (×12): qty 1

## 2023-08-03 NOTE — Plan of Care (Signed)
  Problem: Clinical Measurements: Goal: Respiratory complications will improve Outcome: Progressing Goal: Cardiovascular complication will be avoided Outcome: Progressing   Problem: Activity: Goal: Risk for activity intolerance will decrease Outcome: Progressing   Problem: Coping: Goal: Level of anxiety will decrease Outcome: Progressing   Problem: Elimination: Goal: Will not experience complications related to urinary retention Outcome: Progressing   Problem: Safety: Goal: Ability to remain free from injury will improve Outcome: Progressing

## 2023-08-03 NOTE — Plan of Care (Signed)

## 2023-08-03 NOTE — Progress Notes (Signed)
 PROGRESS NOTE    Harold [name]  ZOX:[id number] DOB: 11/03/1955 DOA: 07/29/2023 PCP: Tita Form, MD   Brief Narrative:  Harold [name] is a 68 y.o. male with medical history significant of chronic hypotension, essential hypertension, COPD, CAD status post CABG, GERD, carotid stenosis stenosis, peripheral vascular disease, chronic venous stasis bilateral lower extremities, hypothyroidism, depression, DM type II, OSA on CPAP, pulmonary hypertension, paroxysmal atrial fibrillation, prolonged QTc, and BPH.  Prior right lower extremity ORIF associated infection s/p I&D on 03/31/23 and 04/02/23 ( cx MRSE and enterobacter claoacae), removal of the hardware by Dr. Curtiss Dowdy 06/21/2023,  chronic infection of the ankle discharged hospital 07/22/2023 to continue ertapenem  and daptomycin  per ID recommendation through 07/31/2023. 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)   Pulmonary hypertension,   Essential hypertension   OSA on CPAP   Paroxysmal atrial fibrillation (HCC)   Hypothyroidism   PAD (peripheral artery disease) (HCC)   Normocytic anemia   PVD (peripheral vascular disease) (HCC)   Neuropathy   Restrictive lung disease   Persistent atrial fibrillation (HCC)   Major depressive disorder, recurrent episode, moderate (HCC)   Hypokalemia   History of COPD   History of peripheral vascular disease   Non-insulin  dependent type 2 diabetes mellitus (HCC)   BPH (benign prostatic hyperplasia)  QTc prolongation-improved TSH within normal limit Patient completed a course of antibiotics Continue to hold Cymbalta  as well as Zoloft  Cardiology  recommends to hold amiodarone  on discharge QTc improved and patient has been cleared by cardiologist for discharge Continue to avoid further QTc prolonging medications include Zofran , Compazine, Atarax , Ativan, Zoloft , Cymbalta , from patient's home medication list.   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 Cardiology has recommended holding amiodarone  at discharge in the setting of QT prolongation Continue Eliquis  5 mg twice daily Continue Coreg  3.125 mg twice daily.   History of CAD s/p CABG Continue Coreg , Lipitor and Eliquis   History of PAD Continue Lipitor and Eliquis    OSA on CPAP Continue CPAP at bedtime.   Chronic normocytic anemia Monitor CBC   Non-insulin -dependent DM type II History of DM type II diet controlled.  A1c within normal range.  Not currently on insulin  at home.   Generalized anxiety disorder History of depression Continue to hold Cymbalta  and Zoloft  in the setting of prolonged QTc. Also need to avoid benzodiazepine Atarax  in the setting of prolonged QTc.   BPH Holding Prozac in the setting of QTc prolongation   History of COPD Stable.  Continue to check pulse ox. Continue ipratropium as needed for wheezing shortness of breath.  Will avoid albuterol  in the setting of prolonged QTc.     DVT prophylaxis: SCDs Start: 07/30/23 0044 Place TED hose Start: 07/30/23 0044 apixaban  (ELIQUIS ) tablet 5 mg  Code Status:   Code Status: Full  Code Family Communication: None present  Status is: Inpatient  Dispo: The patient is from: Home              Anticipated d/c is to: SNF              Anticipated d/c date is: Imminent              Patient currently is medically stable for discharge  Consultants:  Cardiology  Procedures:  None  Antimicrobials:  Completed  Subjective: No acute issues or events overnight  Objective: Vitals:   08/02/23 2015 08/02/23 2135 08/03/23 0030 08/03/23 0445  BP: (!) 172/77 (!) 154/73 (!) 155/71 (!) 169/78  Pulse:  71 68 67  Resp: 18  19 15   Temp:   98.2 F (36.8 C) 97.6 F (36.4 C)  TempSrc:   Oral Oral  SpO2:   100% 98%  Weight:      Height:        Intake/Output Summary (Last 24 hours) at 08/03/2023 0804 Last data filed at 08/03/2023 0500 Gross per 24 hour  Intake 486 ml  Output 750 ml  Net -264 ml   Filed Weights   07/29/23 2029 07/30/23 1538 08/02/23 0450  Weight: 102 kg 104.3 kg 104.9 kg    Examination:  General:  Pleasantly resting in bed, No acute distress. HEENT:  Normocephalic atraumatic.  Sclerae nonicteric, noninjected.  Extraocular movements intact bilaterally. Neck:  Without mass or deformity.  Trachea is midline. Lungs:  Clear to auscultate bilaterally without rhonchi, wheeze, or rales. Heart:  Regular rate and rhythm.  Without murmurs, rubs, or gallops. Abdomen:  Soft, nontender, nondistended.  Without guarding or rebound. Extremities: Without cyanosis, clubbing, edema, or obvious deformity. Skin:  Warm and dry, no erythema.  Data Reviewed: I have personally reviewed following labs and imaging studies  CBC: Recent Labs  Lab 07/29/23 2043 07/30/23 0414 07/31/23 0313 08/01/23 0541 08/02/23 0244  WBC 6.7 6.4 5.2 4.8 5.6  NEUTROABS 3.9  --   --   --   --   HGB 10.2* 10.2* 9.5* 9.8* 10.6*  HCT 29.7* 31.0* 28.9* 29.5* 32.1*  MCV 87.1 88.1 87.0 86.8 87.9  PLT 245 239 197 189 204   Basic Metabolic Panel: Recent Labs  Lab 07/29/23 2043  07/30/23 0414 07/31/23 0313 08/01/23 0541 08/02/23 0244  NA 139 138 137 140 140  K 3.3* 3.0* 3.4* 3.3* 4.2  CL 108 107 109 110 113*  CO2 21* 20* 20* 24 21*  GLUCOSE 94 107* 94 116* 114*  BUN 22 22 17 13 12   CREATININE 2.34* 2.37* 2.08* 1.81* 1.71*  CALCIUM  7.9* 8.3* 7.9* 8.1* 8.1*  MG  --  2.4  --   --   --    GFR: Estimated Creatinine Clearance: 51.8 mL/min (A) (by C-G formula based on SCr of 1.71 mg/dL (H)).  Liver Function Tests: Recent Labs  Lab 07/29/23 2043 07/30/23 0414  AST 39 37  ALT 26 25  ALKPHOS 118 115  BILITOT 0.8 1.0  PROT 5.1* 5.1*  ALBUMIN  1.9* 2.0*   CBG: Recent Labs  Lab 07/30/23 1047  GLUCAP 106*    No results found for this or any previous visit (from the past 240 hours).   Radiology Studies: No results found.  Scheduled Meds:  allopurinol   50 mg Oral Daily   apixaban   5 mg Oral BID   atorvastatin   40 mg Oral Daily   carvedilol   3.125  mg Oral BID   Chlorhexidine  Gluconate Cloth  6 each Topical Daily   clopidogrel   75 mg Oral q morning   ezetimibe   10 mg Oral QHS   feeding supplement  237 mL Oral BID BM   ferrous sulfate   325 mg Oral Once per day on Monday Wednesday Friday   finasteride   5 mg Oral Daily   gabapentin   300 mg Oral QHS   levothyroxine   88 mcg Oral QAC breakfast   pantoprazole   40 mg Oral Daily   sodium chloride  flush  10-40 mL Intracatheter Q12H   sodium chloride  flush  3 mL Intravenous Q12H   sodium chloride  flush  3 mL Intravenous Q12H   Continuous Infusions:   LOS: 4 days   Time spent:  Haydee Lipa, DO [facility]  If 7PM-7AM, please contact night-coverage [email or web address]  08/03/2023, 8:04 AM

## 2023-08-04 ENCOUNTER — Encounter (HOSPITAL_COMMUNITY)

## 2023-08-04 ENCOUNTER — Ambulatory Visit (HOSPITAL_COMMUNITY): Admission: RE | Admit: 2023-08-04 | Source: Ambulatory Visit

## 2023-08-04 ENCOUNTER — Ambulatory Visit (HOSPITAL_COMMUNITY)

## 2023-08-04 DIAGNOSIS — R9431 Abnormal electrocardiogram [ECG] [EKG]: Secondary | ICD-10-CM | POA: Diagnosis not present

## 2023-08-04 MED ORDER — LOSARTAN POTASSIUM 25 MG PO TABS
25.0000 mg | ORAL_TABLET | Freq: Every day | ORAL | Status: DC
  Administered 2023-08-04 – 2023-08-08 (×5): 25 mg via ORAL
  Filled 2023-08-04 (×5): qty 1

## 2023-08-04 NOTE — TOC Progression Note (Addendum)
 Transition of Care [facility]) - Progression Note    Patient Details  Name: JAREMY NOSAL MRN: [id number] Date of Birth: 08-Sep-1955  Transition of Care [facility] CM/SW Contact  Jannice Mends, LCSW Phone Number: 08/04/2023, 9:07 AM  Clinical Narrative:    9am-Insurance approval still pending. CSW reached out to insurance to see if anything additional is needed.    4:52 PM-SNF has been denied by insurance. CSW will follow up on need for appeal.   Expected Discharge Plan: Skilled Nursing Facility Barriers to Discharge: Continued Medical Work up, English as a second language teacher  Expected Discharge Plan and Services In-house Referral: Clinical Social Work     Living arrangements for the past 2 months: Skilled Nursing Facility                                       Social Determinants of Health (SDOH) Interventions SDOH Screenings   Food Insecurity: No Food Insecurity (07/31/2023)  Housing: High Risk (07/31/2023)  Transportation Needs: No Transportation Needs (07/31/2023)  Utilities: Not At Risk (07/31/2023)  Depression (PHQ2-9): Low Risk  (07/29/2023)  Social Connections: Moderately Isolated (07/31/2023)  Tobacco Use: Medium Risk (07/30/2023)    Readmission Risk Interventions     No data to display

## 2023-08-04 NOTE — Progress Notes (Addendum)
 PROGRESS NOTE    Harold [name]  JXB:[id number] DOB: December 25, 1955 DOA: 07/29/2023 PCP: Tita Form, MD   Brief Narrative:  Harold [name] is a 68 y.o. male with medical history significant of chronic hypotension, essential hypertension, COPD, CAD status post CABG, GERD, carotid stenosis stenosis, peripheral vascular disease, chronic venous stasis bilateral lower extremities, hypothyroidism, depression, DM type II, OSA on CPAP, pulmonary hypertension, paroxysmal atrial fibrillation, prolonged QTc, and BPH.  Prior right lower extremity ORIF associated infection s/p I&D on 03/31/23 and 04/02/23 ( cx MRSE and enterobacter claoacae), removal of the hardware by Dr. Curtiss Dowdy 06/21/2023,  chronic infection of the ankle discharged hospital 07/22/2023 to continue ertapenem  and daptomycin  per ID recommendation through 07/31/2023. 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 (HCC)   Pulmonary hypertension,   Essential hypertension   OSA on CPAP   Paroxysmal atrial fibrillation (HCC)   Hypothyroidism   PAD (peripheral artery disease) (HCC)   Normocytic anemia   PVD (peripheral vascular disease) (HCC)   Neuropathy   Restrictive lung disease   Persistent atrial fibrillation (HCC)   Major depressive disorder, recurrent episode, moderate (HCC)   Hypokalemia   History of COPD   History of peripheral vascular disease   Non-insulin  dependent type 2 diabetes mellitus (HCC)   BPH (benign prostatic hyperplasia)  QTc prolongation-improved TSH within normal limit Patient completed a course of antibiotics Continue to hold Cymbalta  as well as Zoloft  Cardiology  recommends to hold amiodarone  on discharge QTc improved and patient has been cleared by cardiologist for discharge Continue to avoid further QTc prolonging medications include Zofran , Compazine, Atarax , Ativan, Zoloft , Cymbalta , from patient's home medication list.   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 Cardiology has recommended holding amiodarone  at discharge in the setting of QT prolongation Continue Eliquis  5 mg twice daily Continue Coreg  3.125 mg twice daily.   History of CAD s/p CABG Continue Coreg , Lipitor and Eliquis   History of PAD Continue Lipitor and Eliquis    OSA on CPAP Continue CPAP at bedtime.   Chronic normocytic anemia Monitor CBC   Non-insulin -dependent DM type II History of DM type II diet controlled.  A1c within normal range.  Not currently on insulin  at home.   Generalized anxiety disorder History of depression Continue to hold Cymbalta  and Zoloft  in the setting of prolonged QTc. Also need to avoid benzodiazepine Atarax  in the setting of prolonged QTc.   BPH Holding Prozac in the setting of QTc prolongation   History of COPD Stable.  Continue to check pulse ox. Continue ipratropium as needed for wheezing shortness of breath.  Will avoid albuterol  in the setting of prolonged QTc.     DVT prophylaxis: SCDs Start: 07/30/23 0044 Place TED hose Start: 07/30/23 0044 apixaban  (ELIQUIS ) tablet 5 mg  Code Status:   Code Status:  Full Code Family Communication: None present  Status is: Inpatient  Dispo: The patient is from: Home              Anticipated d/c is to: SNF              Anticipated d/c date is: Imminent              Patient currently is medically stable for discharge  Consultants:  Cardiology  Procedures:  None  Antimicrobials:  Completed  Subjective: No acute issues or events overnight  Objective: Vitals:   08/03/23 1716 08/03/23 1939 08/03/23 2334 08/04/23 0513  BP: (!) 168/74 (!) 163/77 (!) 159/69 (!) 155/72  Pulse: 62 65 65 61  Resp: (!) 27 (!) 24 (!) 22 20  Temp: 97.6 F (36.4 C) 97.9 F (36.6 C) 97.7 F (36.5 C) 97.8 F (36.6 C)  TempSrc: Oral Oral Oral Oral  SpO2: 100% 100% 99% 99%  Weight:    103.3 kg  Height:        Intake/Output Summary (Last 24 hours) at 08/04/2023 0723 Last data filed at 08/03/2023 1700 Gross per 24 hour  Intake 120 ml  Output --  Net 120 ml   Filed Weights   07/30/23 1538 08/02/23 0450 08/04/23 0513  Weight: 104.3 kg 104.9 kg 103.3 kg    Examination:  General:  Pleasantly resting in bed, No acute distress. HEENT:  Normocephalic atraumatic.  Sclerae nonicteric, noninjected.  Extraocular movements intact bilaterally. Neck:  Without mass or deformity.  Trachea is midline. Lungs:  Clear to auscultate bilaterally without rhonchi, wheeze, or rales. Heart:  Regular rate and rhythm.  Without murmurs, rubs, or gallops. Abdomen:  Soft, nontender, nondistended.  Without guarding or rebound. Extremities: Without cyanosis, clubbing, edema, or obvious deformity. Skin:  Warm and dry, no erythema.  Data Reviewed: I have personally reviewed following labs and imaging studies  CBC: Recent Labs  Lab 07/29/23 2043 07/30/23 0414 07/31/23 0313 08/01/23 0541 08/02/23 0244  WBC 6.7 6.4 5.2 4.8 5.6  NEUTROABS 3.9  --   --   --   --   HGB 10.2* 10.2* 9.5* 9.8* 10.6*  HCT 29.7* 31.0* 28.9* 29.5* 32.1*  MCV 87.1 88.1 87.0 86.8 87.9  PLT 245 239 197 189  204   Basic Metabolic Panel: Recent Labs  Lab 07/29/23 2043 07/30/23 0414 07/31/23 0313 08/01/23 0541 08/02/23 0244  NA 139 138 137 140 140  K 3.3* 3.0* 3.4* 3.3* 4.2  CL 108 107 109 110 113*  CO2 21* 20* 20* 24 21*  GLUCOSE 94 107* 94 116* 114*  BUN 22 22 17 13 12   CREATININE 2.34* 2.37* 2.08* 1.81* 1.71*  CALCIUM  7.9* 8.3* 7.9* 8.1* 8.1*  MG  --  2.4  --   --   --    GFR: Estimated Creatinine Clearance: 51.5 mL/min (A) (by C-G formula based on SCr of 1.71 mg/dL (H)).  Liver Function Tests: Recent Labs  Lab 07/29/23 2043 07/30/23 0414  AST 39 37  ALT 26 25  ALKPHOS 118 115  BILITOT 0.8 1.0  PROT 5.1* 5.1*  ALBUMIN  1.9* 2.0*   CBG: Recent Labs  Lab 07/30/23 1047  GLUCAP 106*    No results found for this or any previous visit (from the past 240 hours).   Radiology Studies: No results found.  Scheduled Meds:  allopurinol   50 mg Oral Daily   apixaban   5 mg Oral BID   atorvastatin   40 mg Oral Daily   carvedilol   6.25 mg Oral BID WC   Chlorhexidine  Gluconate Cloth  6 each Topical Daily   clopidogrel   75 mg Oral q morning   ezetimibe   10 mg Oral QHS   feeding supplement  237 mL Oral BID BM   ferrous sulfate   325 mg Oral Once per day on Monday Wednesday Friday   finasteride   5 mg Oral Daily   gabapentin   300 mg Oral QHS   levothyroxine   88 mcg Oral QAC breakfast   pantoprazole   40 mg Oral Daily   sodium chloride  flush  10-40 mL Intracatheter Q12H   sodium chloride  flush  3 mL Intravenous Q12H   sodium chloride  flush  3 mL Intravenous Q12H   Continuous Infusions:   LOS: 5 days   Time spent:  Haydee Lipa, DO [facility]  If 7PM-7AM, please contact night-coverage [email or web address]  08/04/2023, 7:23 AM

## 2023-08-04 NOTE — Progress Notes (Signed)
 Physical Therapy Treatment Patient Details Name: Harold [name] MRN: [id number] DOB: 07/20/1955 Today's Date: 08/04/2023   History of Present Illness Harold [name] is a 68 y.o. male admitted 07/29/23 for QT prolongation. PMHx: HTN, HLD, CAD s/p PCI, COPD, DM2, CKD stage IIIb, PVD, gout, OSA, neuropathy, COPD, a-fib, CAD s/p percutaneous coronary angioplasty, angina, obesity, RLE ORIF with associated infection s/p I&D on 03/31/23 and 04/02/23, hardware removal 06/21/23, on antibiotics course scheduled to end 6/12. Of note, recent admission 5/29-6/3 for hypotension.    PT Comments  Pt was agreeable to working on OOB activity.  Slowly progressing toward goals.  Emphasis on transition to EOB, sitting balance/scooting, sit to stand trials x7 reps with standing activity during which BP's taken.   Initial dizziness progressing to less dizziness, more fatigue.  BP's initially in the 80's/50's and slowly rose through upper 90's/60's to lower 100's/70's.  Pt showed progressive fatigue overall.  Remained in bed after session.    If plan is discharge home, recommend the following: Two people to help with walking and/or transfers;A lot of help with bathing/dressing/bathroom;Assistance with cooking/housework;Assist for transportation;Help with stairs or ramp for entrance   Can travel by private vehicle     No  Equipment Recommendations  None recommended by PT (TBA next venue)    Recommendations for Other Services       Precautions / Restrictions Precautions Precautions: Fall Recall of Precautions/Restrictions: Intact Restrictions RLE Weight Bearing Per Provider Order: Weight bearing as tolerated     Mobility  Bed Mobility Overal bed mobility: Needs Assistance Bed Mobility: Supine to Sit, Sit to Supine     Supine to sit: Min assist Sit to supine: Mod assist, +2 for safety/equipment   General bed mobility comments: pt having difficulty coming forward and using UE's to scoot to EOB.   also trouble staying up while getting feet into bed.  All needing truncal and extremity assist    Transfers Overall transfer level: Needs assistance Equipment used: Rolling walker (2 wheels) Transfers: Sit to/from Stand Sit to Stand: From elevated surface, Mod assist, +2 physical assistance, Min assist (more mod assist needed as number of reps increased.)           General transfer comment: cues for hand placment and assist for coming forward and boost.    Ambulation/Gait               General Gait Details: side stepped toward [facility] with mod assist and RW   Stairs             Wheelchair Mobility     Tilt Bed    Modified Rankin (Stroke Patients Only)       Balance     Sitting balance-Leahy Scale: Fair Sitting balance - Comments: posterior bias once fatigued   Standing balance support: Bilateral upper extremity supported, During functional activity, Reliant on assistive device for balance Standing balance-Leahy Scale: Poor Standing balance comment: Reliant on RW and therapist support to maintain upright and counter left lateral lean                            Communication Communication Communication: No apparent difficulties  Cognition Arousal: Alert Behavior During Therapy: WFL for tasks assessed/performed   PT - Cognitive impairments: No apparent impairments                       PT - Cognition Comments: Pt A,Ox4. Following commands:  Intact      Cueing Cueing Techniques: Verbal cues  Exercises      General Comments General comments (skin integrity, edema, etc.): notable posterior and L w/shift bias.      Pertinent Vitals/Pain Pain Assessment Faces Pain Scale: No hurt Pain Intervention(s): Monitored during session    Home Living                          Prior Function            PT Goals (current goals can now be found in the care plan section) Acute Rehab PT Goals PT Goal Formulation: With  patient Time For Goal Achievement: 08/14/23 Potential to Achieve Goals: Fair Progress towards PT goals: Progressing toward goals    Frequency    Min 2X/week      PT Plan      Co-evaluation              AM-PAC PT 6 Clicks Mobility   Outcome Measure  Help needed turning from your back to your side while in a flat bed without using bedrails?: A Little Help needed moving from lying on your back to sitting on the side of a flat bed without using bedrails?: A Lot Help needed moving to and from a bed to a chair (including a wheelchair)?: Total Help needed standing up from a chair using your arms (e.g., wheelchair or bedside chair)?: A Lot Help needed to walk in hospital room?: Total Help needed climbing 3-5 steps with a railing? : Total 6 Click Score: 10    End of Session   Activity Tolerance: Patient tolerated treatment well;Patient limited by fatigue Patient left: in bed;with call bell/phone within reach Nurse Communication: Mobility status PT Visit Diagnosis: Other abnormalities of gait and mobility (R26.89);Muscle weakness (generalized) (M62.81)     Time: 5366-4403 PT Time Calculation (min) (ACUTE ONLY): 33 min  Charges:    $Therapeutic Activity: 23-37 mins PT General Charges $$ ACUTE PT VISIT: 1 Visit                     08/04/2023  Nohemi Batters., PT Acute Rehabilitation Services [phone]  (office)   Durell Gilding Rainah Kirshner 08/04/2023, 4:54 PM

## 2023-08-04 NOTE — Progress Notes (Signed)
   08/04/23 2125  BiPAP/CPAP/SIPAP  BiPAP/CPAP/SIPAP Pt Type Adult  Reason BIPAP/CPAP not in use Non-compliant  BiPAP/CPAP /SiPAP Vitals  Pulse Rate 61  SpO2 100 %

## 2023-08-04 NOTE — Plan of Care (Signed)

## 2023-08-05 ENCOUNTER — Other Ambulatory Visit (HOSPITAL_COMMUNITY): Payer: Self-pay

## 2023-08-05 DIAGNOSIS — R9431 Abnormal electrocardiogram [ECG] [EKG]: Secondary | ICD-10-CM | POA: Diagnosis not present

## 2023-08-05 NOTE — Progress Notes (Signed)
 PROGRESS NOTE    Harold [name]  DGU:[id number] DOB: [birth year] DOA: 07/29/2023 PCP: Tita Form, MD   Brief Narrative:  Harold [name] is a 68 y.o. male with medical history significant of chronic hypotension, essential hypertension, COPD, CAD status post CABG, GERD, carotid stenosis stenosis, peripheral vascular disease, chronic venous stasis bilateral lower extremities, hypothyroidism, depression, DM type II, OSA on CPAP, pulmonary hypertension, paroxysmal atrial fibrillation, prolonged QTc, and BPH.  Prior right lower extremity ORIF associated infection s/p I&D on 03/31/23 and 04/02/23 ( cx MRSE and enterobacter claoacae), removal of the hardware by Dr. Curtiss Dowdy 06/21/2023,  chronic infection of the ankle discharged hospital 07/22/2023 to continue ertapenem  and daptomycin  per ID recommendation through 07/31/2023. 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 (HCC)   Pulmonary hypertension,   Essential hypertension   OSA on CPAP   Paroxysmal atrial fibrillation (HCC)   Hypothyroidism   PAD (peripheral artery disease) (HCC)   Normocytic anemia   PVD (peripheral vascular disease) (HCC)   Neuropathy   Restrictive lung disease   Persistent atrial fibrillation (HCC)   Major depressive disorder, recurrent episode, moderate (HCC)   Hypokalemia   History of COPD   History of peripheral vascular disease   Non-insulin  dependent type 2 diabetes mellitus (HCC)   BPH (benign prostatic hyperplasia)  QTc prolongation-improved TSH within normal limit Patient completed a course of antibiotics Continue to hold Cymbalta  as well as Zoloft  Cardiology  recommends to hold amiodarone  on discharge QTc improved and patient has been cleared by cardiologist for discharge Continue to avoid further QTc prolonging medications include Zofran , Compazine, Atarax , Ativan, Zoloft , Cymbalta , from patient's home medication list.   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 Cardiology has recommended holding amiodarone  at discharge in the setting of QT prolongation Continue Eliquis  5 mg twice daily Continue Coreg  3.125 mg twice daily.   History of CAD s/p CABG Continue Coreg , Lipitor and Eliquis   History of PAD Continue Lipitor and Eliquis    OSA on CPAP Continue CPAP at bedtime.   Chronic normocytic anemia Monitor CBC   Non-insulin -dependent DM type II History of DM type II diet controlled.  A1c within normal range.  Not currently on insulin  at home.   Generalized anxiety disorder History of depression Continue to hold Cymbalta  and Zoloft  in the setting of prolonged QTc. Also need to avoid benzodiazepine Atarax  in the setting of prolonged QTc.   BPH Holding Prozac in the setting of QTc prolongation   History of COPD Stable.  Continue to check pulse ox. Continue ipratropium as needed for wheezing shortness of breath.  Will avoid albuterol  in the setting of prolonged QTc.   DVT prophylaxis: SCDs Start: 07/30/23 0044 Place TED hose Start: 07/30/23 0044 apixaban  (ELIQUIS ) tablet 5 mg  Code  Status:   Code Status: Full  Code Family Communication: None present  Status is: Inpatient  Dispo: The patient is from: Home              Anticipated d/c is to: SNF              Anticipated d/c date is: Imminent              Patient currently is medically stable for discharge  Consultants:  Cardiology Discussed case with vascular surgery, plan for outpatient workup as planned for repeat imaging; no indication for inpatient consult or imaging at this time  Procedures:  None  Antimicrobials:  Completed  Subjective: No acute issues or events overnight  Objective: Vitals:   08/04/23 2342 08/05/23 0000 08/05/23 0521 08/05/23 0724  BP: (!) 170/68 (!) 167/61 (!) 173/68 139/65  Pulse: 64   (!) 59  Resp: 19 15 18 18   Temp: 98.2 F (36.8 C)  98.5 F (36.9 C) 97.6 F (36.4 C)  TempSrc: Oral  Oral Oral  SpO2: 100%   100%  Weight:   103.6 kg   Height:        Intake/Output Summary (Last 24 hours) at 08/05/2023 0739 Last data filed at 08/04/2023 2342 Gross per 24 hour  Intake --  Output 650 ml  Net -650 ml   Filed Weights   08/02/23 0450 08/04/23 0513 08/05/23 0521  Weight: 104.9 kg 103.3 kg 103.6 kg    Examination:  General:  Pleasantly resting in bed, No acute distress. HEENT:  Normocephalic atraumatic.  Sclerae nonicteric, noninjected.  Extraocular movements intact bilaterally. Neck:  Without mass or deformity.  Trachea is midline. Lungs:  Clear to auscultate bilaterally without rhonchi, wheeze, or rales. Heart:  Regular rate and rhythm.  Without murmurs, rubs, or gallops. Abdomen:  Soft, nontender, nondistended.  Without guarding or rebound. Extremities: Without cyanosis, clubbing, edema, or obvious deformity. Skin:  Warm and dry, no erythema.  Data Reviewed: I have personally reviewed following labs and imaging studies  CBC: Recent Labs  Lab 07/29/23 2043 07/30/23 0414 07/31/23 0313 08/01/23 0541 08/02/23 0244  WBC 6.7 6.4 5.2 4.8 5.6  NEUTROABS 3.9  --   --   --   --   HGB 10.2*  10.2* 9.5* 9.8* 10.6*  HCT 29.7* 31.0* 28.9* 29.5* 32.1*  MCV 87.1 88.1 87.0 86.8 87.9  PLT 245 239 197 189 204   Basic Metabolic Panel: Recent Labs  Lab 07/29/23 2043 07/30/23 0414 07/31/23 0313 08/01/23 0541 08/02/23 0244  NA 139 138 137 140 140  K 3.3* 3.0* 3.4* 3.3* 4.2  CL 108 107 109 110 113*  CO2 21* 20* 20* 24 21*  GLUCOSE 94 107* 94 116* 114*  BUN 22 22 17 13 12   CREATININE 2.34* 2.37* 2.08* 1.81* 1.71*  CALCIUM  7.9* 8.3* 7.9* 8.1* 8.1*  MG  --  2.4  --   --   --    GFR: Estimated Creatinine Clearance: 51.6 mL/min (A) (by C-G formula based on SCr of 1.71 mg/dL (H)).  Liver Function Tests: Recent Labs  Lab 07/29/23 2043 07/30/23 0414  AST 39 37  ALT 26 25  ALKPHOS 118 115  BILITOT 0.8 1.0  PROT 5.1* 5.1*  ALBUMIN  1.9* 2.0*   CBG: Recent Labs  Lab 07/30/23 1047  GLUCAP 106*    No results found for this or any previous visit (from the past 240 hours).   Radiology Studies: No results found.  Scheduled Meds:  allopurinol   50 mg Oral Daily   apixaban   5 mg Oral BID   atorvastatin   40 mg Oral Daily   carvedilol   6.25 mg Oral BID WC   Chlorhexidine  Gluconate Cloth  6 each Topical Daily   clopidogrel   75 mg Oral q morning   ezetimibe   10 mg Oral QHS   feeding supplement  237 mL Oral BID BM   ferrous sulfate   325 mg Oral Once per day on Monday Wednesday Friday   finasteride   5 mg Oral Daily   gabapentin   300 mg Oral QHS   levothyroxine   88 mcg Oral QAC breakfast   losartan   25 mg Oral Daily   pantoprazole   40 mg Oral Daily   sodium chloride  flush  10-40 mL Intracatheter Q12H   sodium chloride  flush  3 mL Intravenous Q12H   sodium chloride  flush  3 mL Intravenous Q12H   Continuous Infusions:   LOS: 6 days   Time spent:  Haydee Lipa, DO [facility]  If 7PM-7AM, please contact night-coverage [email or web address]  08/05/2023, 7:39 AM

## 2023-08-05 NOTE — TOC Progression Note (Signed)
 Transition of Care [facility]) - Progression Note    Patient Details  Name: Harold [name] MRN: [id number] Date of Birth: 1955/05/25  [facility] CM/SW Contact  Arron Big, [place] Phone Number: 08/05/2023, 4:10 PM  Clinical Narrative:  Insurance denied [employer] for [employer] Patient expressed to CSW that he would like to appeal insurances denial. CSW started fast appeal by faxing appointment of representative form and additional clinicals to [phone]. CSW stated on fax sheet that we are wanting to initiate a fast appeal; insurance typically takes 72 hours to render a decision.   TOC will continue to follow.    Expected Discharge Plan: Skilled Nursing Facility Barriers to Discharge: Continued Medical Work up, English as a second language teacher  Expected Discharge Plan and Services In-house Referral: Clinical Social Work     Living arrangements for the past 2 months: Skilled Nursing Facility                                       Social Determinants of Health (SDOH) Interventions SDOH Screenings   Food Insecurity: No Food Insecurity (07/31/2023)  Housing: High Risk (07/31/2023)  Transportation Needs: No Transportation Needs (07/31/2023)  Utilities: Not At Risk (07/31/2023)  Depression (PHQ2-9): Low Risk  (07/29/2023)  Social Connections: Moderately Isolated (07/31/2023)  Tobacco Use: Medium Risk (07/30/2023)    Readmission Risk Interventions     No data to display

## 2023-08-05 NOTE — Plan of Care (Signed)
  Problem: Education: Goal: Knowledge of General Education information will improve Description: Including pain rating scale, medication(s)/side effects and non-pharmacologic comfort measures Outcome: Progressing   Problem: Clinical Measurements: Goal: Ability to maintain clinical measurements within normal limits will improve Outcome: Progressing Goal: Will remain free from infection Outcome: Progressing Goal: Diagnostic test results will improve Outcome: Progressing Goal: Respiratory complications will improve Outcome: Progressing Goal: Cardiovascular complication will be avoided Outcome: Progressing   Problem: Activity: Goal: Risk for activity intolerance will decrease Outcome: Progressing   Problem: Coping: Goal: Level of anxiety will decrease Outcome: Progressing   Problem: Elimination: Goal: Will not experience complications related to bowel motility Outcome: Progressing Goal: Will not experience complications related to urinary retention Outcome: Progressing   Problem: Pain Managment: Goal: General experience of comfort will improve and/or be controlled Outcome: Progressing   Problem: Skin Integrity: Goal: Risk for impaired skin integrity will decrease Outcome: Progressing

## 2023-08-06 ENCOUNTER — Encounter: Payer: Self-pay | Admitting: Cardiology

## 2023-08-06 DIAGNOSIS — R9431 Abnormal electrocardiogram [ECG] [EKG]: Secondary | ICD-10-CM | POA: Diagnosis not present

## 2023-08-06 NOTE — Progress Notes (Signed)
 Occupational Therapy Treatment Patient Details Name: Harold [name] MRN: [id number] DOB: Oct 03, 1955 Today's Date: 08/06/2023   History of present illness Harold [name] is a 68 y.o. male admitted 07/29/23 for QT prolongation. PMHx: HTN, HLD, CAD s/p PCI, COPD, DM2, CKD stage IIIb, PVD, gout, OSA, neuropathy, COPD, a-fib, CAD s/p percutaneous coronary angioplasty, angina, obesity, RLE ORIF with associated infection s/p I&D on 03/31/23 and 04/02/23, hardware removal 06/21/23, on antibiotics course scheduled to end 6/12. Of note, recent admission 5/29-6/3 for hypotension.   OT comments  Patient received in supine and agreeable to OT/PT treatment. Patient demonstrating gains with bed mobility, standing tolerance and transfers. Patient's BP monitored during treatment with 169/65 supine 111/57 seated on EOB, 106/77 seated on EOB after standing from EOB, and 106/56 seated after 2nd stand, unable to get reading while standing. After transfer to recliner 82/57 and following 4 minutes in recliner 122/67. Patient will benefit from continued inpatient follow up therapy, <3 hours/day. Acute OT to continue to follow to address established goals to facilitate DC to next venue of care.        If plan is discharge home, recommend the following:  Two people to help with walking and/or transfers;A lot of help with bathing/dressing/bathroom;Assistance with cooking/housework;Assist for transportation;Help with stairs or ramp for entrance   Equipment Recommendations  Other (comment) (defer to next venue of care)    Recommendations for Other Services      Precautions / Restrictions Precautions Precautions: Fall Recall of Precautions/Restrictions: Intact Precaution/Restrictions Comments: watch BP Restrictions Weight Bearing Restrictions Per Provider Order: No       Mobility Bed Mobility Overal bed mobility: Needs Assistance Bed Mobility: Supine to Sit     Supine to sit: Mod assist     General  bed mobility comments: patient able to get to EOB with PT assist only    Transfers Overall transfer level: Needs assistance Equipment used: Rolling walker (2 wheels) Transfers: Sit to/from Stand, Bed to chair/wheelchair/BSC Sit to Stand: Max assist, +2 physical assistance, From elevated surface, Mod assist     Step pivot transfers: Mod assist, +2 safety/equipment     General transfer comment: performed sit to stand from EOB with max assist +2 from low height and mod assist +2 to power up from elevated bed and able to perfrom step pivot transfer to recliner with mod assist +2     Balance Overall balance assessment: Needs assistance Sitting-balance support: Bilateral upper extremity supported, Feet supported Sitting balance-Leahy Scale: Fair Sitting balance - Comments: Pt sat EOB with supervision Postural control: Posterior lean Standing balance support: Bilateral upper extremity supported, During functional activity, Reliant on assistive device for balance Standing balance-Leahy Scale: Poor Standing balance comment: reliant on RW for support                           ADL either performed or assessed with clinical judgement   ADL Overall ADL's : Needs assistance/impaired     Grooming: Set up;Sitting           Upper Body Dressing : Minimal assistance;Sitting Upper Body Dressing Details (indicate cue type and reason): gown     Toilet Transfer: Moderate assistance;+2 for physical assistance;Rolling walker (2 wheels)             General ADL Comments: limited due to soft BP    Extremity/Trunk Assessment              Vision  Perception     Praxis     Communication Communication Communication: No apparent difficulties   Cognition Arousal: Alert Behavior During Therapy: WFL for tasks assessed/performed               OT - Cognition Comments: Patient able to recall COTA from when he was on [facility] program                 Following  commands: Intact        Cueing   Cueing Techniques: Verbal cues  Exercises      Shoulder Instructions       General Comments BP in supine 169/65, seated on EOB 111/57, seated after standing 106/67 and 106/56 after 2nd stand. 82/57 after transfer to recliner with 122/67 after 4 minutes up in recliner    Pertinent Vitals/ Pain       Pain Assessment Pain Assessment: No/denies pain Pain Intervention(s): Monitored during session  Home Living                                          Prior Functioning/Environment              Frequency  Min 2X/week        Progress Toward Goals  OT Goals(current goals can now be found in the care plan section)  Progress towards OT goals: Progressing toward goals  Acute Rehab OT Goals Patient Stated Goal: to get more rehab OT Goal Formulation: With patient Time For Goal Achievement: 08/14/23 Potential to Achieve Goals: Good ADL Goals Pt Will Perform Grooming: Independently;sitting Pt Will Perform Upper Body Dressing: with contact guard assist;sitting Pt Will Perform Lower Body Dressing: with mod assist;sit to/from stand Pt Will Transfer to Toilet: with min assist;with +2 assist;stand pivot transfer Pt Will Perform Toileting - Clothing Manipulation and hygiene: with min assist;with 2+ total assist;sit to/from stand  Plan      Co-evaluation    PT/OT/SLP Co-Evaluation/Treatment: Yes Reason for Co-Treatment: For patient/therapist safety;To address functional/ADL transfers   OT goals addressed during session: ADL's and self-care      AM-PAC OT 6 Clicks Daily Activity     Outcome Measure   Help from another person eating meals?: None Help from another person taking care of personal grooming?: A Little Help from another person toileting, which includes using toliet, bedpan, or urinal?: A Lot Help from another person bathing (including washing, rinsing, drying)?: A Lot Help from another person to put on and  taking off regular upper body clothing?: A Lot Help from another person to put on and taking off regular lower body clothing?: A Lot 6 Click Score: 15    End of Session Equipment Utilized During Treatment: Gait belt;Rolling walker (2 wheels)  OT Visit Diagnosis: Unsteadiness on feet (R26.81);Other abnormalities of gait and mobility (R26.89);Muscle weakness (generalized) (M62.81);Repeated falls (R29.6);History of falling (Z91.81);Pain   Activity Tolerance Patient tolerated treatment well   Patient Left in chair;with call bell/phone within reach;with chair alarm set;with nursing/sitter in room   Nurse Communication Mobility status        Time: 8657-8469 OT Time Calculation (min): 25 min  Charges: OT General Charges $OT Visit: 1 Visit OT Treatments $Self Care/Home Management : 8-22 mins  Anitra Barn, OTA Acute Rehabilitation Services  Office [phone]   Jovita Nipper 08/06/2023, 2:08 PM

## 2023-08-06 NOTE — Care Management Important Message (Signed)
 Important Message  Patient Details  Name: Harold [name] MRN: [id number] Date of Birth: 03-Nov-1955   Important Message Given:  Yes - Medicare IM     Janith Melnick 08/06/2023, 11:10 AM

## 2023-08-06 NOTE — Plan of Care (Signed)

## 2023-08-06 NOTE — TOC Progression Note (Addendum)
 Transition of Care [facility]) - Progression Note    Patient Details  Name: GAUGE WINSKI MRN: [id number] Date of Birth: Oct 01, 1955  Transition of [facility]) CM/SW Contact  Arron Big, [place] Phone Number: 08/06/2023, 10:20 AM  Clinical Narrative:   CSW contacted insurance (phone number for fast appeal [phone]) and verified fast appeal has been started. Per representative, clinical documents have been received and appeal is being processed at this time. Insurance will reach out to CSW with the determination within 72 hours (Due date for decision is Friday, June 20th).    10:33 AM CSW updated [name] of insurance appeal being started. [name] would like to speak with MD. MD notified. [name] stated if Jaryan needs to discharge home that they have previously worked with [facility].   11:48 AM Insurance called CSW and are requested updated OT notes for appeal decision. CSW notified OT.   11:52 AM Insurance called CSW to confirm if appeal is for SNF or IP rehab. CSW reiterated the appeal is for SNF. Insurance rep, Ron B, stated they do not need updated clinicals at this time.   4:01 PM [employer] rep Egbert Grass., called CSW to state they need updated clinicals for appeal decision. CSW to fax information to [phone]; CSW can call Carlon Chester at [phone] ext [phone].   TOC will continue to follow.    Expected Discharge Plan: Skilled Nursing Facility Barriers to Discharge: Continued Medical Work up, English as a second language teacher  Expected Discharge Plan and Services In-house Referral: Clinical Social Work     Living arrangements for the past 2 months: Skilled Nursing Facility                                       Social Determinants of Health (SDOH) Interventions SDOH Screenings   Food Insecurity: No Food Insecurity (07/31/2023)  Housing: High Risk (07/31/2023)  Transportation Needs: No Transportation Needs (07/31/2023)  Utilities: Not At Risk (07/31/2023)  Depression (PHQ2-9): Low Risk   (07/29/2023)  Social Connections: Moderately Isolated (07/31/2023)  Tobacco Use: Medium Risk (07/30/2023)    Readmission Risk Interventions     No data to display

## 2023-08-06 NOTE — Progress Notes (Signed)
 Physical Therapy Treatment Patient Details Name: Harold [name] MRN: [id number] DOB: 1955-10-29 Today's Date: 08/06/2023   History of Present Illness Harold [name] is a 68 y.o. male admitted 07/29/23 for QT prolongation. PMHx: HTN, HLD, CAD s/p PCI, COPD, DM2, CKD stage IIIb, PVD, gout, OSA, neuropathy, COPD, a-fib, CAD s/p percutaneous coronary angioplasty, angina, obesity, RLE ORIF with associated infection s/p I&D on 03/31/23 and 04/02/23, hardware removal 06/21/23, on antibiotics course scheduled to end 6/12. Of note, recent admission 5/29-6/3 for hypotension.   PT Comments  Pt greeted supine in bed, pleasant and agreeable to PT session. He performed transfers with +2 assist. Pt initially demonstrated a posterior lean and NBOS, but was receptive to cues for improved positioning. His primary limiting factor is symptomatic orthostatic hypotension. Patient will benefit from continued inpatient follow up therapy, <3 hours/day  Blood Pressures Supine: 169/65 (96) Seated: 111/57 (71) Unable to assess in standing d/t worsening dizziness and pt's fatigue Seated following first stand: 106/67 (77) Seated following second stand: 106/56 (72) Seated following transfer to recliner chair: 82/57 (66) Seated with feet elevated after rest in chair: 122/67 (82)     If plan is discharge home, recommend the following: Two people to help with walking and/or transfers;A lot of help with bathing/dressing/bathroom;Assistance with cooking/housework;Assist for transportation;Help with stairs or ramp for entrance   Can travel by private vehicle     No  Equipment Recommendations  None recommended by PT    Recommendations for Other Services       Precautions / Restrictions Precautions Precautions: Fall Recall of Precautions/Restrictions: Intact Precaution/Restrictions Comments: watch BP Restrictions Weight Bearing Restrictions Per Provider Order: No     Mobility  Bed Mobility Overal bed  mobility: Needs Assistance Bed Mobility: Supine to Sit     Supine to sit: Mod assist     General bed mobility comments: Pt sat up on R side of the bed with increased time. He brought BLE off EOB. Assist to elevate trunk. Pt scooted fwd til feet flat with BUE support. Cue to lean to the opposite side and advance the other hip.    Transfers Overall transfer level: Needs assistance Equipment used: Rolling walker (2 wheels) Transfers: Sit to/from Stand, Bed to chair/wheelchair/BSC Sit to Stand: Max assist, +2 physical assistance, From elevated surface, Mod assist   Step pivot transfers: Mod assist, +2 safety/equipment       General transfer comment: Cued pt to scoot fwd to the edge and increased fwd lean prior to powering up. From the lowest bed height he required maxA x2. Pt achieved static stance with a NBOS, heels touching and BLE in ER, and a posterior lean. He c/o dizziness. Allowed a seated rest and raised bed height for second attempt. He required modA x2 to power up and maintain feet shoulder width apart. He reported dizziness again and was unable to maintain standing while BP was assessed. Pt transferred from bed>chair to the R with short slow steps. Good eccentric control with sitting.    Ambulation/Gait                   Stairs             Wheelchair Mobility     Tilt Bed    Modified Rankin (Stroke Patients Only)       Balance Overall balance assessment: Needs assistance Sitting-balance support: Bilateral upper extremity supported, Feet supported Sitting balance-Leahy Scale: Fair Sitting balance - Comments: Pt sat EOB with supervision Postural  control: Posterior lean Standing balance support: Bilateral upper extremity supported, During functional activity, Reliant on assistive device for balance Standing balance-Leahy Scale: Poor Standing balance comment: Pt dependent on RW and has a posterior lean initially, able to improve and required modA x2.                             Communication Communication Communication: No apparent difficulties  Cognition Arousal: Alert Behavior During Therapy: WFL for tasks assessed/performed   PT - Cognitive impairments: No apparent impairments                         Following commands: Intact      Cueing Cueing Techniques: Verbal cues  Exercises      General Comments General comments (skin integrity, edema, etc.): BP: supine 169/65 (96), seated 111/57 (71), unable to assess in standing, seated following first stand 106/67 (77), seated following second stand 106/56 (72), seated following transfer to recliner chair 82/57 (66), seated with feet elevated after rest 122/67 (82).      Pertinent Vitals/Pain Pain Assessment Pain Assessment: No/denies pain    Home Living                          Prior Function            PT Goals (current goals can now be found in the care plan section) Acute Rehab PT Goals Patient Stated Goal: Return to rehab Progress towards PT goals: Progressing toward goals    Frequency    Min 2X/week      PT Plan      Co-evaluation PT/OT/SLP Co-Evaluation/Treatment: Yes Reason for Co-Treatment: For patient/therapist safety;To address functional/ADL transfers PT goals addressed during session: Mobility/safety with mobility;Balance        AM-PAC PT 6 Clicks Mobility   Outcome Measure  Help needed turning from your back to your side while in a flat bed without using bedrails?: A Little Help needed moving from lying on your back to sitting on the side of a flat bed without using bedrails?: A Lot Help needed moving to and from a bed to a chair (including a wheelchair)?: Total Help needed standing up from a chair using your arms (e.g., wheelchair or bedside chair)?: Total Help needed to walk in hospital room?: Total Help needed climbing 3-5 steps with a railing? : Total 6 Click Score: 9    End of Session Equipment  Utilized During Treatment: Gait belt Activity Tolerance: Patient tolerated treatment well;Treatment limited secondary to medical complications (Comment) (hypotension) Patient left: in chair;with call bell/phone within reach;with chair alarm set Nurse Communication: Mobility status PT Visit Diagnosis: Other abnormalities of gait and mobility (R26.89);Muscle weakness (generalized) (M62.81) Pain - Right/Left: Right Pain - part of body: Ankle and joints of foot     Time: 1610-9604 PT Time Calculation (min) (ACUTE ONLY): 25 min  Charges:    $Therapeutic Activity: 8-22 mins PT General Charges $$ ACUTE PT VISIT: 1 Visit                     Glenford Lanes, PT, DPT Acute Rehabilitation Services Office: [phone] Secure Chat Preferred  Riva Chester 08/06/2023, 11:12 AM

## 2023-08-06 NOTE — Progress Notes (Signed)
 PROGRESS NOTE    Harold [name]  NWG:[id number] DOB: 07-11-1955 DOA: 07/29/2023 PCP: Tita Form, MD   Brief Narrative:  Harold [name] is a 68 y.o. male with medical history significant of chronic hypotension, essential hypertension, COPD, CAD status post CABG, GERD, carotid stenosis stenosis, peripheral vascular disease, chronic venous stasis bilateral lower extremities, hypothyroidism, depression, DM type II, OSA on CPAP, pulmonary hypertension, paroxysmal atrial fibrillation, prolonged QTc, and BPH. Prior right lower extremity ORIF associated infection s/p I&D on 03/31/23 and 04/02/23 ( cx MRSE and enterobacter claoacae), removal of the hardware by Dr. Curtiss Dowdy 06/21/2023,  chronic infection of the ankle discharged hospital 07/22/2023, completed ertapenem  and daptomycin  per ID recommendation on 07/31/2023. On this admission, found to be hypotensive with presyncopal/dizziness sent to ED for evaluation.  Patient has been further stabilized.  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 (HCC)   Pulmonary hypertension,   Essential hypertension   OSA on CPAP   Paroxysmal atrial fibrillation (HCC)   Hypothyroidism   PAD (peripheral artery disease) (HCC)   Normocytic anemia   PVD (peripheral vascular disease) (HCC)   Neuropathy   Restrictive lung disease   Persistent atrial fibrillation (HCC)   Major depressive disorder, recurrent episode, moderate (HCC)   Hypokalemia   History of COPD   History of peripheral vascular disease   Non-insulin  dependent type 2 diabetes mellitus (HCC)   BPH (benign prostatic hyperplasia)  QTc prolongation-improved TSH within normal limit Patient  completed a course of antibiotics Continue to hold Cymbalta  as well as Zoloft  Cardiology recommends to hold amiodarone  on discharge QTc improved and patient has been cleared by cardiologist for discharge Continue to avoid further QTc prolonging medications include Zofran , Compazine, Atarax , Ativan, Zoloft , Cymbalta , from patient's home medication list.   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 Cardiology has recommended holding amiodarone  at discharge in the setting of QT prolongation Continue Eliquis  5 mg twice daily Continue Coreg  3.125 mg twice daily.   History of CAD s/p CABG Continue Coreg , Lipitor and Eliquis   History of PAD Continue Lipitor and Eliquis    OSA on CPAP Continue CPAP at bedtime.   Chronic normocytic anemia   Non-insulin -dependent DM type II History of DM type II diet controlled.  A1c within normal range.  Not currently on insulin  at home.   Generalized anxiety disorder History of depression Continue to hold Cymbalta  and Zoloft  in the setting of prolonged QTc. Also need to avoid benzodiazepine Atarax  in the setting of prolonged QTc.   BPH Holding Prozac in the setting of QTc prolongation   History of COPD Stable Continue ipratropium as needed for wheezing shortness of breath.  Will avoid albuterol  in the setting of prolonged QTc.   DVT prophylaxis: SCDs Start: 07/30/23 0044 Place TED hose Start: 07/30/23 0044  apixaban   (ELIQUIS ) tablet 5 mg  Code Status:   Code Status: Full Code Family Communication: None present  Status is: Inpatient  Dispo: The patient is from: Home              Anticipated d/c is to: SNF              Anticipated d/c date is: Imminent              Patient currently is medically stable for discharge  Consultants:  Cardiology Discussed case with vascular surgery, plan for outpatient workup as planned for repeat imaging; no indication for inpatient consult or imaging at this time  Procedures:  None  Antimicrobials:  Completed  Subjective: Denies any new complaints.  Objective: Vitals:   08/06/23 0049 08/06/23 0417 08/06/23 1147 08/06/23 1548  BP: (!) 157/59 (!) 146/58  (!) 162/65  Pulse: 60 (!) 56 71 66  Resp: 20 19  12   Temp: 97.7 F (36.5 C) 97.8 F (36.6 C) 97.6 F (36.4 C) (!) 97.5 F (36.4 C)  TempSrc: Oral Oral Oral Oral  SpO2: 100% 100% 90% 100%  Weight:  101.6 kg    Height:        Intake/Output Summary (Last 24 hours) at 08/06/2023 1651 Last data filed at 08/06/2023 0418 Gross per 24 hour  Intake 237 ml  Output 400 ml  Net -163 ml   Filed Weights   08/04/23 0513 08/05/23 0521 08/06/23 0417  Weight: 103.3 kg 103.6 kg 101.6 kg    Examination: General: NAD  Cardiovascular: S1, S2 present Respiratory: CTAB Abdomen: Soft, nontender, nondistended, bowel sounds present Musculoskeletal: No bilateral pedal edema noted Skin: Normal Psychiatry: Normal mood    Data Reviewed: I have personally reviewed following labs and imaging studies  CBC: Recent Labs  Lab 07/31/23 0313 08/01/23 0541 08/02/23 0244  WBC 5.2 4.8 5.6  HGB 9.5* 9.8* 10.6*  HCT 28.9* 29.5* 32.1*  MCV 87.0 86.8 87.9  PLT 197 189 204   Basic Metabolic Panel: Recent Labs  Lab 07/31/23 0313 08/01/23 0541 08/02/23 0244  NA 137 140 140  K 3.4* 3.3* 4.2  CL 109 110 113*  CO2 20* 24 21*  GLUCOSE 94 116* 114*  BUN 17 13 12   CREATININE 2.08* 1.81* 1.71*  CALCIUM  7.9* 8.1* 8.1*    GFR: Estimated Creatinine Clearance: 51.1 mL/min (A) (by C-G formula based on SCr of 1.71 mg/dL (H)).  Liver Function Tests: No results for input(s): AST, ALT, ALKPHOS, BILITOT, PROT, ALBUMIN  in the last 168 hours.  CBG: No results for input(s): GLUCAP in the last 168 hours.   No results found for this or any previous visit (from the past 240 hours).   Radiology Studies: No results found.  Scheduled Meds:  allopurinol   50 mg Oral Daily   apixaban   5 mg Oral BID   atorvastatin   40 mg Oral Daily   carvedilol   6.25 mg Oral BID WC   Chlorhexidine  Gluconate Cloth  6 each Topical Daily   clopidogrel   75 mg Oral q morning   ezetimibe   10 mg Oral QHS   feeding supplement  237 mL Oral BID BM   ferrous sulfate   325 mg Oral Once per day on Monday Wednesday Friday   finasteride   5 mg Oral Daily   gabapentin   300 mg Oral QHS   levothyroxine   88 mcg Oral QAC breakfast   losartan   25 mg Oral Daily   pantoprazole   40 mg Oral Daily  sodium chloride  flush  10-40 mL Intracatheter Q12H   sodium chloride  flush  3 mL Intravenous Q12H   sodium chloride  flush  3 mL Intravenous Q12H   Continuous Infusions:   LOS: 7 days    Veronica Gordon, MD [facility]  If 7PM-7AM, please contact night-coverage [email or web address]  08/06/2023, 4:51 PM

## 2023-08-06 NOTE — Telephone Encounter (Signed)
 I sent a secure chat to the hospitalist-Dr. Hester Lot who is taking care of him to see if we can get this done I have also attached Dr. Charlotte Cookey.  Unfortunate I will be in [facility] tomorrow so will be difficult for me to follow up on this.   DH

## 2023-08-06 NOTE — Telephone Encounter (Signed)
 Just heard from Hospitalist-- tests have been ordered -probably done tomorrow.  DH

## 2023-08-07 ENCOUNTER — Inpatient Hospital Stay (HOSPITAL_COMMUNITY)

## 2023-08-07 DIAGNOSIS — I739 Peripheral vascular disease, unspecified: Secondary | ICD-10-CM

## 2023-08-07 DIAGNOSIS — R9431 Abnormal electrocardiogram [ECG] [EKG]: Secondary | ICD-10-CM | POA: Diagnosis not present

## 2023-08-07 LAB — BASIC METABOLIC PANEL WITH GFR
Anion gap: 10 (ref 5–15)
BUN: 11 mg/dL (ref 8–23)
CO2: 21 mmol/L — ABNORMAL LOW (ref 22–32)
Calcium: 8 mg/dL — ABNORMAL LOW (ref 8.9–10.3)
Chloride: 108 mmol/L (ref 98–111)
Creatinine, Ser: 1.94 mg/dL — ABNORMAL HIGH (ref 0.61–1.24)
GFR, Estimated: 37 mL/min — ABNORMAL LOW (ref >60)
Glucose, Bld: 126 mg/dL — ABNORMAL HIGH (ref 70–99)
Potassium: 2.9 mmol/L — ABNORMAL LOW (ref 3.5–5.1)
Sodium: 139 mmol/L (ref 135–145)

## 2023-08-07 LAB — VAS US ABI WITH/WO TBI
Left ABI: 1.1
Right ABI: 1.18

## 2023-08-07 LAB — MAGNESIUM: Magnesium: 1.6 mg/dL — ABNORMAL LOW (ref 1.7–2.4)

## 2023-08-07 MED ORDER — POTASSIUM CHLORIDE CRYS ER 20 MEQ PO TBCR
40.0000 meq | EXTENDED_RELEASE_TABLET | Freq: Four times a day (QID) | ORAL | Status: AC
  Administered 2023-08-07 – 2023-08-08 (×2): 40 meq via ORAL
  Filled 2023-08-07 (×2): qty 2

## 2023-08-07 MED ORDER — MAGNESIUM SULFATE 2 GM/50ML IV SOLN
2.0000 g | Freq: Once | INTRAVENOUS | Status: AC
  Administered 2023-08-07: 2 g via INTRAVENOUS
  Filled 2023-08-07: qty 50

## 2023-08-07 MED ORDER — POTASSIUM CHLORIDE CRYS ER 20 MEQ PO TBCR: 40.0000 meq | EXTENDED_RELEASE_TABLET | Freq: Two times a day (BID) | ORAL | Status: DC

## 2023-08-07 NOTE — Progress Notes (Signed)
 Pt is requesting for home meds zoloft  and cymbatla to be added to [facility], he is feeling anxious and overwhelmed because he has been in the hospital since last November.

## 2023-08-07 NOTE — Progress Notes (Signed)
 PROGRESS NOTE    JLYN BRACAMONTE  ZOX:[id number] DOB: 1955/09/24 DOA: 07/29/2023 PCP: Tita Form, MD   Brief Narrative:  Harold [name] is a 68 y.o. male with medical history significant of chronic hypotension, essential hypertension, COPD, CAD status post CABG, GERD, carotid stenosis stenosis, peripheral vascular disease, chronic venous stasis bilateral lower extremities, hypothyroidism, depression, DM type II, OSA on CPAP, pulmonary hypertension, paroxysmal atrial fibrillation, prolonged QTc, and BPH. Prior right lower extremity ORIF associated infection s/p I&D on 03/31/23 and 04/02/23 ( cx MRSE and enterobacter claoacae), removal of the hardware by Dr. Curtiss Dowdy 06/21/2023,  chronic infection of the ankle discharged hospital 07/22/2023, completed ertapenem  and daptomycin  per ID recommendation on 07/31/2023. On this admission, found to be hypotensive with presyncopal/dizziness sent to ED for evaluation.  Patient has been further stabilized.  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)   Pulmonary hypertension,   Essential hypertension   OSA on CPAP   Paroxysmal atrial fibrillation (HCC)   Hypothyroidism   PAD (peripheral artery disease) (HCC)   Normocytic anemia   PVD (peripheral vascular disease) (HCC)   Neuropathy   Restrictive lung disease   Persistent atrial fibrillation (HCC)   Major depressive disorder, recurrent episode, moderate (HCC)   Hypokalemia   History of COPD   History of peripheral vascular disease   Non-insulin  dependent type 2 diabetes mellitus (HCC)   BPH (benign prostatic hyperplasia)  QTc prolongation-improved, EKG pending TSH within normal  limit Patient completed a course of antibiotics Continue to hold Cymbalta  as well as Zoloft  Cardiology recommends to hold amiodarone  on discharge QTc improved and patient has been cleared by cardiologist for discharge Continue to avoid further QTc prolonging medications include Zofran , Compazine, Atarax , Ativan, Zoloft , Cymbalta , from patient's home medication list.   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 Cardiology has recommended holding amiodarone  at discharge in the setting of QT prolongation Continue Eliquis  5 mg twice daily Continue Coreg  3.125 mg twice daily.   History of CAD s/p CABG Continue Coreg , Lipitor and Eliquis   History of PAD Continue Lipitor and Eliquis    OSA on CPAP Continue CPAP at bedtime.   Chronic normocytic anemia   Non-insulin -dependent DM type II History of DM type II diet controlled.  A1c within normal range.  Not currently on insulin  at home.   Generalized anxiety disorder History of depression Continue to hold Cymbalta  and Zoloft  in the setting of prolonged QTc. Also need to avoid benzodiazepine Atarax  in the setting of prolonged QTc.   BPH Holding Prozac in the setting of QTc prolongation   History of COPD Stable Continue ipratropium as needed for wheezing shortness of breath.  Will avoid albuterol  in the setting of prolonged QTc.   DVT prophylaxis: SCDs Start: 07/30/23 0044 Place TED hose Start: 07/30/23 0044 apixaban  (ELIQUIS ) tablet 5 mg  Code Status:  Code Status: Full Code Family  Communication: None present  Status is: Inpatient  Dispo: The patient is from: Home              Anticipated d/c is to: SNF              Anticipated d/c date is: likely 6/20              Patient currently is medically stable for discharge  Consultants:  Cardiology   Procedures:  None  Antimicrobials:  Completed  Subjective: Denies any new complaints.  Objective: Vitals:   08/07/23 0013 08/07/23 0538 08/07/23 0721 08/07/23 1530  BP: (!) 139/56 (!) 172/74 (!) 157/66 (!) 157/75  Pulse: (!) 58 60 (!) 55 60  Resp: 20 20 12 19   Temp: 97.6 F (36.4 C) 97.6 F (36.4 C) 98.3 F (36.8 C) (!) 96.5 F (35.8 C)  TempSrc: Oral Oral Oral Axillary  SpO2: 100% 98% 97% 98%  Weight:  102.3 kg    Height:        Intake/Output Summary (Last 24 hours) at 08/07/2023 1814 Last data filed at 08/07/2023 1533 Gross per 24 hour  Intake 100 ml  Output 700 ml  Net -600 ml   Filed Weights   08/05/23 0521 08/06/23 0417 08/07/23 0538  Weight: 103.6 kg 101.6 kg 102.3 kg    Examination: General: NAD  Cardiovascular: S1, S2 present Respiratory: CTAB Abdomen: Soft, nontender, nondistended, bowel sounds present Musculoskeletal: No bilateral pedal edema noted Skin: Normal Psychiatry: Normal mood    Data Reviewed: I have personally reviewed following labs and imaging studies  CBC: Recent Labs  Lab 08/01/23 0541 08/02/23 0244  WBC 4.8 5.6  HGB 9.8* 10.6*  HCT 29.5* 32.1*  MCV 86.8 87.9  PLT 189 204   Basic Metabolic Panel: Recent Labs  Lab 08/01/23 0541 08/02/23 0244 08/07/23 0922  NA 140 140 139  K 3.3* 4.2 2.9*  CL 110 113* 108  CO2 24 21* 21*  GLUCOSE 116* 114* 126*  BUN 13 12 11   CREATININE 1.81* 1.71* 1.94*  CALCIUM  8.1* 8.1* 8.0*  MG  --   --  1.6*   GFR: Estimated Creatinine Clearance: 45.2 mL/min (A) (by C-G formula based on SCr of 1.94 mg/dL (H)).  Liver Function Tests: No results for input(s): AST, ALT, ALKPHOS, BILITOT, PROT, ALBUMIN  in the  last 168 hours.  CBG: No results for input(s): GLUCAP in the last 168 hours.   No results found for this or any previous visit (from the past 240 hours).   Radiology Studies: VAS US  ABI WITH/WO TBI Result Date: 08/07/2023  LOWER EXTREMITY DOPPLER STUDY Patient Name:  DUKE WEISENSEL  Date of Exam:   08/07/2023 Medical Rec #: [id number]             Accession #:    [id number] Date of Birth: [birth year]             Patient Gender: M Patient Age:   68 years Exam Location:  [facility] Procedure:      VAS US  ABI WITH/WO TBI Referring Phys: Greene Diodato --------------------------------------------------------------------------------  Indications: Peripheral artery disease. High Risk Factors: Hypertension, hyperlipidemia, Diabetes, past history of                    smoking, coronary artery disease.  Comparison Study: 11/26/2022 ABI R-0.95 (TBI 0.48), L-0.89 (TBI 0.44) Performing Technologist: Vonzell Guerin RVT  Examination Guidelines: A complete evaluation includes at minimum, Doppler waveform signals  and systolic blood pressure reading at the level of bilateral brachial, anterior tibial, and posterior tibial arteries, when vessel segments are accessible. Bilateral testing is considered an integral part of a complete examination. Photoelectric Plethysmograph (PPG) waveforms and toe systolic pressure readings are included as required and additional duplex testing as needed. Limited examinations for reoccurring indications may be performed as noted.  ABI Findings: +-----+------------------+-----+-----------+--------+ RightRt Pressure (mmHg)IndexWaveform   Comment  +-----+------------------+-----+-----------+--------+ PTA  180               1.18 multiphasic         +-----+------------------+-----+-----------+--------+ DP   158               1.04 monophasic          +-----+------------------+-----+-----------+--------+ +--------+------------------+-----+----------+-------+ Left     Lt Pressure (mmHg)IndexWaveform  Comment +--------+------------------+-----+----------+-------+ [id number]                                      +--------+------------------+-----+----------+-------+ PTA     167               1.10 monophasic        +--------+------------------+-----+----------+-------+ DP      154               1.01 monophasic        +--------+------------------+-----+----------+-------+ +-------+-----------+-----------+------------+------------+ ABI/TBIToday's ABIToday's TBIPrevious ABIPrevious TBI +-------+-----------+-----------+------------+------------+ Right  1.18                  0.95                     +-------+-----------+-----------+------------+------------+ Left   1.10                  0.89                     +-------+-----------+-----------+------------+------------+  Summary: Right: Resting right ankle-brachial index is within normal range. Left: Resting left ankle-brachial index is within normal range. *See table(s) above for measurements and observations.  Electronically signed by Runell Countryman on 08/07/2023 at 5:42:21 PM.    Final    VAS US  LOWER EXTREMITY ARTERIAL DUPLEX Result Date: 08/07/2023 LOWER EXTREMITY ARTERIAL DUPLEX STUDY Patient Name:  CHESNEY KLIMASZEWSKI  Date of Exam:   08/07/2023 Medical Rec #: [id number]             Accession #:    [id number] Date of Birth: September 20, 1955             Patient Gender: M Patient Age:   68 years Exam Location:  [facility] Procedure:      VAS US  LOWER EXTREMITY ARTERIAL DUPLEX Referring Phys: Socrates Cahoon --------------------------------------------------------------------------------  Indications: Peripheral artery disease. High Risk Factors: Hypertension, hyperlipidemia, Diabetes, past history of                    smoking, coronary artery disease.  Vascular Interventions: Prior BLE interventions. Current ABI:            R-1.18                         L-1.10 Comparison Study:  11/26/2022 increased velocities in the left distal pop. art.                   and left ATA. Performing Technologist: Vonzell Guerin, RVT  Examination  Guidelines: A complete evaluation includes B-mode imaging, spectral Doppler, color Doppler, and power Doppler as needed of all accessible portions of each vessel. Bilateral testing is considered an integral part of a complete examination. Limited examinations for reoccurring indications may be performed as noted.  +----------+--------+-----+--------+-----------+-------------+ RIGHT     PSV cm/sRatioStenosisWaveform   Comments      +----------+--------+-----+--------+-----------+-------------+ CFA Prox  99                   triphasic                +----------+--------+-----+--------+-----------+-------------+ DFA       157                  biphasic                 +----------+--------+-----+--------+-----------+-------------+ SFA Prox  132                  triphasic                +----------+--------+-----+--------+-----------+-------------+ SFA Mid   93                   triphasic                +----------+--------+-----+--------+-----------+-------------+ SFA Distal60                   multiphasic              +----------+--------+-----+--------+-----------+-------------+ POP Prox  69                   multiphasic              +----------+--------+-----+--------+-----------+-------------+ POP Distal123                  multiphasic              +----------+--------+-----+--------+-----------+-------------+ ATA Distal10                                            +----------+--------+-----+--------+-----------+-------------+ PTA Distal89                   multiphasic              +----------+--------+-----+--------+-----------+-------------+ DP        20                   monophasic reversed flow +----------+--------+-----+--------+-----------+-------------+   +----------+--------+-----+---------------+-----------+--------+ LEFT      PSV cm/sRatioStenosis       Waveform   Comments +----------+--------+-----+---------------+-----------+--------+ CFA Prox  97                          triphasic           +----------+--------+-----+---------------+-----------+--------+ DFA       150                         biphasic            +----------+--------+-----+---------------+-----------+--------+ SFA Prox  134                         multiphasic         +----------+--------+-----+---------------+-----------+--------+ SFA Mid   161  multiphasic         +----------+--------+-----+---------------+-----------+--------+ SFA Distal102                         multiphasic         +----------+--------+-----+---------------+-----------+--------+ POP Prox  67                          multiphasic         +----------+--------+-----+---------------+-----------+--------+ POP Distal432          75-99% stenosis                    +----------+--------+-----+---------------+-----------+--------+ ATA Distal21                          monophasic          +----------+--------+-----+---------------+-----------+--------+ PTA Distal87                          monophasic          +----------+--------+-----+---------------+-----------+--------+ DP        14                          monophasic          +----------+--------+-----+---------------+-----------+--------+  Summary: Right: There is no hemodynamically significant stenosis seen in the right lower extremity. Left: 75-99% stenosis noted in the popliteal artery. There appears to be an increase in the left popliteal artery velocities when compared to the prior exam.   See table(s) above for measurements and observations. Electronically signed by Runell Countryman on 08/07/2023 at 5:42:06 PM.    Final     Scheduled Meds:  allopurinol   50 mg Oral Daily    apixaban   5 mg Oral BID   atorvastatin   40 mg Oral Daily   carvedilol   6.25 mg Oral BID WC   Chlorhexidine  Gluconate Cloth  6 each Topical Daily   clopidogrel   75 mg Oral q morning   ezetimibe   10 mg Oral QHS   feeding supplement  237 mL Oral BID BM   ferrous sulfate   325 mg Oral Once per day on Monday Wednesday Friday   finasteride   5 mg Oral Daily   gabapentin   300 mg Oral QHS   levothyroxine   88 mcg Oral QAC breakfast   losartan   25 mg Oral Daily   pantoprazole   40 mg Oral Daily   potassium chloride   40 mEq Oral BID   sodium chloride  flush  10-40 mL Intracatheter Q12H   sodium chloride  flush  3 mL Intravenous Q12H   sodium chloride  flush  3 mL Intravenous Q12H   Continuous Infusions:  magnesium  sulfate bolus IVPB       LOS: 8 days    Veronica Gordon, MD [facility]  If 7PM-7AM, please contact night-coverage [email or web address]  08/07/2023, 6:14 PM

## 2023-08-07 NOTE — TOC Progression Note (Addendum)
 Transition of Care [facility]) - Progression Note    Patient Details  Name: JOHNTAVIUS SHEPARD MRN: [id number] Date of Birth: May 06, 1955  [facility] CM/SW Contact  Arron Big, [place] Phone Number: 08/07/2023, 12:19 PM  Clinical Narrative:   Insurance called CSW and insurance appeal has been approved. Auth id [id number]. CSW updated facility and [place].   12:39 PM Approval Dates: 08/07/2023-08/11/2023  2:22 PM [facility] no longer has bed availability. [facility] is able to accept patient. CSW spoke with [name] and he is agreeable to patient discharging there. CSW contacted insurance to update facility. Per insurance, CSW will need to email Aris at [email or web address] to change facility for this authorization. CSW emailed and is awaiting a response.   2:55 PM Per insurance, will start a ticket to have facility changed to [facility] instead of CSW having to resubmit for auth. Insurance will contact CSW with new information.   3:58 PM Insurance called CSW back and [facility] has been approved for STR from 06/19-06/23. Auth id [id number]. CM for this case is Gerianne Kobs but insurance stated for CSW to contact/update them with any changes.   TOC will continue to follow.    Expected Discharge Plan: Skilled Nursing Facility Barriers to Discharge: Continued Medical Work up, English as a second language teacher  Expected Discharge Plan and Services In-house Referral: Clinical Social Work     Living arrangements for the past 2 months: Skilled Nursing Facility                                       Social Determinants of Health (SDOH) Interventions SDOH Screenings   Food Insecurity: No Food Insecurity (07/31/2023)  Housing: High Risk (07/31/2023)  Transportation Needs: No Transportation Needs (07/31/2023)  Utilities: Not At Risk (07/31/2023)  Depression (PHQ2-9): Low Risk  (07/29/2023)  Social Connections: Moderately Isolated (07/31/2023)  Tobacco Use: Medium Risk (07/30/2023)     Readmission Risk Interventions     No data to display

## 2023-08-08 DIAGNOSIS — S82401A Unspecified fracture of shaft of right fibula, initial encounter for closed fracture: Secondary | ICD-10-CM | POA: Diagnosis not present

## 2023-08-08 DIAGNOSIS — L97411 Non-pressure chronic ulcer of right heel and midfoot limited to breakdown of skin: Secondary | ICD-10-CM | POA: Diagnosis not present

## 2023-08-08 DIAGNOSIS — I5032 Chronic diastolic (congestive) heart failure: Secondary | ICD-10-CM | POA: Diagnosis present

## 2023-08-08 DIAGNOSIS — M6281 Muscle weakness (generalized): Secondary | ICD-10-CM | POA: Diagnosis not present

## 2023-08-08 DIAGNOSIS — R609 Edema, unspecified: Secondary | ICD-10-CM | POA: Diagnosis not present

## 2023-08-08 DIAGNOSIS — M00871 Arthritis due to other bacteria, right ankle and foot: Secondary | ICD-10-CM | POA: Diagnosis not present

## 2023-08-08 DIAGNOSIS — E119 Type 2 diabetes mellitus without complications: Secondary | ICD-10-CM | POA: Diagnosis not present

## 2023-08-08 DIAGNOSIS — Z743 Need for continuous supervision: Secondary | ICD-10-CM | POA: Diagnosis not present

## 2023-08-08 DIAGNOSIS — E1165 Type 2 diabetes mellitus with hyperglycemia: Secondary | ICD-10-CM | POA: Diagnosis not present

## 2023-08-08 DIAGNOSIS — R600 Localized edema: Secondary | ICD-10-CM | POA: Diagnosis not present

## 2023-08-08 DIAGNOSIS — I2510 Atherosclerotic heart disease of native coronary artery without angina pectoris: Secondary | ICD-10-CM | POA: Diagnosis present

## 2023-08-08 DIAGNOSIS — L03115 Cellulitis of right lower limb: Secondary | ICD-10-CM | POA: Diagnosis present

## 2023-08-08 DIAGNOSIS — N1831 Chronic kidney disease, stage 3a: Secondary | ICD-10-CM | POA: Diagnosis not present

## 2023-08-08 DIAGNOSIS — I129 Hypertensive chronic kidney disease with stage 1 through stage 4 chronic kidney disease, or unspecified chronic kidney disease: Secondary | ICD-10-CM | POA: Diagnosis not present

## 2023-08-08 DIAGNOSIS — I4891 Unspecified atrial fibrillation: Secondary | ICD-10-CM | POA: Diagnosis not present

## 2023-08-08 DIAGNOSIS — Z8709 Personal history of other diseases of the respiratory system: Secondary | ICD-10-CM | POA: Diagnosis not present

## 2023-08-08 DIAGNOSIS — E860 Dehydration: Secondary | ICD-10-CM | POA: Diagnosis not present

## 2023-08-08 DIAGNOSIS — S82891D Other fracture of right lower leg, subsequent encounter for closed fracture with routine healing: Secondary | ICD-10-CM | POA: Diagnosis not present

## 2023-08-08 DIAGNOSIS — Z794 Long term (current) use of insulin: Secondary | ICD-10-CM | POA: Diagnosis not present

## 2023-08-08 DIAGNOSIS — R404 Transient alteration of awareness: Secondary | ICD-10-CM | POA: Diagnosis not present

## 2023-08-08 DIAGNOSIS — S8251XA Displaced fracture of medial malleolus of right tibia, initial encounter for closed fracture: Secondary | ICD-10-CM | POA: Diagnosis not present

## 2023-08-08 DIAGNOSIS — I272 Pulmonary hypertension, unspecified: Secondary | ICD-10-CM | POA: Diagnosis not present

## 2023-08-08 DIAGNOSIS — E871 Hypo-osmolality and hyponatremia: Secondary | ICD-10-CM | POA: Diagnosis not present

## 2023-08-08 DIAGNOSIS — S82851E Displaced trimalleolar fracture of right lower leg, subsequent encounter for open fracture type I or II with routine healing: Secondary | ICD-10-CM | POA: Diagnosis not present

## 2023-08-08 DIAGNOSIS — R58 Hemorrhage, not elsewhere classified: Secondary | ICD-10-CM | POA: Diagnosis not present

## 2023-08-08 DIAGNOSIS — M799 Soft tissue disorder, unspecified: Secondary | ICD-10-CM | POA: Diagnosis not present

## 2023-08-08 DIAGNOSIS — Z7985 Long-term (current) use of injectable non-insulin antidiabetic drugs: Secondary | ICD-10-CM | POA: Diagnosis not present

## 2023-08-08 DIAGNOSIS — E43 Unspecified severe protein-calorie malnutrition: Secondary | ICD-10-CM | POA: Diagnosis not present

## 2023-08-08 DIAGNOSIS — L02415 Cutaneous abscess of right lower limb: Secondary | ICD-10-CM | POA: Diagnosis not present

## 2023-08-08 DIAGNOSIS — I499 Cardiac arrhythmia, unspecified: Secondary | ICD-10-CM | POA: Diagnosis not present

## 2023-08-08 DIAGNOSIS — I4819 Other persistent atrial fibrillation: Secondary | ICD-10-CM | POA: Diagnosis not present

## 2023-08-08 DIAGNOSIS — N400 Enlarged prostate without lower urinary tract symptoms: Secondary | ICD-10-CM | POA: Diagnosis present

## 2023-08-08 DIAGNOSIS — E039 Hypothyroidism, unspecified: Secondary | ICD-10-CM | POA: Diagnosis present

## 2023-08-08 DIAGNOSIS — F32A Depression, unspecified: Secondary | ICD-10-CM | POA: Diagnosis present

## 2023-08-08 DIAGNOSIS — Z951 Presence of aortocoronary bypass graft: Secondary | ICD-10-CM | POA: Diagnosis not present

## 2023-08-08 DIAGNOSIS — Z7901 Long term (current) use of anticoagulants: Secondary | ICD-10-CM | POA: Diagnosis not present

## 2023-08-08 DIAGNOSIS — I70234 Atherosclerosis of native arteries of right leg with ulceration of heel and midfoot: Secondary | ICD-10-CM | POA: Diagnosis not present

## 2023-08-08 DIAGNOSIS — S99919A Unspecified injury of unspecified ankle, initial encounter: Secondary | ICD-10-CM | POA: Diagnosis not present

## 2023-08-08 DIAGNOSIS — T84624D Infection and inflammatory reaction due to internal fixation device of right fibula, subsequent encounter: Secondary | ICD-10-CM | POA: Diagnosis not present

## 2023-08-08 DIAGNOSIS — D649 Anemia, unspecified: Secondary | ICD-10-CM | POA: Diagnosis not present

## 2023-08-08 DIAGNOSIS — K219 Gastro-esophageal reflux disease without esophagitis: Secondary | ICD-10-CM | POA: Diagnosis present

## 2023-08-08 DIAGNOSIS — S93401A Sprain of unspecified ligament of right ankle, initial encounter: Secondary | ICD-10-CM | POA: Diagnosis not present

## 2023-08-08 DIAGNOSIS — M25579 Pain in unspecified ankle and joints of unspecified foot: Secondary | ICD-10-CM | POA: Diagnosis not present

## 2023-08-08 DIAGNOSIS — R9431 Abnormal electrocardiogram [ECG] [EKG]: Secondary | ICD-10-CM | POA: Diagnosis not present

## 2023-08-08 DIAGNOSIS — I959 Hypotension, unspecified: Secondary | ICD-10-CM | POA: Diagnosis not present

## 2023-08-08 DIAGNOSIS — M19071 Primary osteoarthritis, right ankle and foot: Secondary | ICD-10-CM | POA: Diagnosis not present

## 2023-08-08 DIAGNOSIS — E1169 Type 2 diabetes mellitus with other specified complication: Secondary | ICD-10-CM | POA: Diagnosis present

## 2023-08-08 DIAGNOSIS — Z8679 Personal history of other diseases of the circulatory system: Secondary | ICD-10-CM | POA: Diagnosis not present

## 2023-08-08 DIAGNOSIS — M86671 Other chronic osteomyelitis, right ankle and foot: Secondary | ICD-10-CM | POA: Diagnosis present

## 2023-08-08 DIAGNOSIS — S82002D Unspecified fracture of left patella, subsequent encounter for closed fracture with routine healing: Secondary | ICD-10-CM | POA: Diagnosis not present

## 2023-08-08 DIAGNOSIS — D631 Anemia in chronic kidney disease: Secondary | ICD-10-CM | POA: Diagnosis present

## 2023-08-08 DIAGNOSIS — M86171 Other acute osteomyelitis, right ankle and foot: Secondary | ICD-10-CM | POA: Diagnosis not present

## 2023-08-08 DIAGNOSIS — J449 Chronic obstructive pulmonary disease, unspecified: Secondary | ICD-10-CM | POA: Diagnosis present

## 2023-08-08 DIAGNOSIS — E1149 Type 2 diabetes mellitus with other diabetic neurological complication: Secondary | ICD-10-CM | POA: Diagnosis present

## 2023-08-08 DIAGNOSIS — I951 Orthostatic hypotension: Secondary | ICD-10-CM | POA: Diagnosis not present

## 2023-08-08 DIAGNOSIS — N1832 Chronic kidney disease, stage 3b: Secondary | ICD-10-CM | POA: Diagnosis present

## 2023-08-08 DIAGNOSIS — I70213 Atherosclerosis of native arteries of extremities with intermittent claudication, bilateral legs: Secondary | ICD-10-CM | POA: Diagnosis not present

## 2023-08-08 DIAGNOSIS — E785 Hyperlipidemia, unspecified: Secondary | ICD-10-CM | POA: Diagnosis present

## 2023-08-08 DIAGNOSIS — I10 Essential (primary) hypertension: Secondary | ICD-10-CM | POA: Diagnosis not present

## 2023-08-08 DIAGNOSIS — I739 Peripheral vascular disease, unspecified: Secondary | ICD-10-CM | POA: Diagnosis not present

## 2023-08-08 DIAGNOSIS — R42 Dizziness and giddiness: Secondary | ICD-10-CM | POA: Diagnosis not present

## 2023-08-08 DIAGNOSIS — E1142 Type 2 diabetes mellitus with diabetic polyneuropathy: Secondary | ICD-10-CM | POA: Diagnosis present

## 2023-08-08 DIAGNOSIS — I480 Paroxysmal atrial fibrillation: Secondary | ICD-10-CM | POA: Diagnosis present

## 2023-08-08 DIAGNOSIS — Z7401 Bed confinement status: Secondary | ICD-10-CM | POA: Diagnosis not present

## 2023-08-08 DIAGNOSIS — M65971 Unspecified synovitis and tenosynovitis, right ankle and foot: Secondary | ICD-10-CM | POA: Diagnosis not present

## 2023-08-08 DIAGNOSIS — R6889 Other general symptoms and signs: Secondary | ICD-10-CM | POA: Diagnosis not present

## 2023-08-08 DIAGNOSIS — R7881 Bacteremia: Secondary | ICD-10-CM | POA: Diagnosis not present

## 2023-08-08 DIAGNOSIS — L89610 Pressure ulcer of right heel, unstageable: Secondary | ICD-10-CM | POA: Diagnosis not present

## 2023-08-08 DIAGNOSIS — M009 Pyogenic arthritis, unspecified: Secondary | ICD-10-CM | POA: Diagnosis present

## 2023-08-08 DIAGNOSIS — S91301A Unspecified open wound, right foot, initial encounter: Secondary | ICD-10-CM | POA: Diagnosis not present

## 2023-08-08 DIAGNOSIS — E1140 Type 2 diabetes mellitus with diabetic neuropathy, unspecified: Secondary | ICD-10-CM | POA: Diagnosis not present

## 2023-08-08 DIAGNOSIS — I4581 Long QT syndrome: Secondary | ICD-10-CM | POA: Diagnosis not present

## 2023-08-08 DIAGNOSIS — L089 Local infection of the skin and subcutaneous tissue, unspecified: Secondary | ICD-10-CM | POA: Diagnosis not present

## 2023-08-08 DIAGNOSIS — E559 Vitamin D deficiency, unspecified: Secondary | ICD-10-CM | POA: Diagnosis not present

## 2023-08-08 DIAGNOSIS — E1122 Type 2 diabetes mellitus with diabetic chronic kidney disease: Secondary | ICD-10-CM | POA: Diagnosis present

## 2023-08-08 DIAGNOSIS — I130 Hypertensive heart and chronic kidney disease with heart failure and stage 1 through stage 4 chronic kidney disease, or unspecified chronic kidney disease: Secondary | ICD-10-CM | POA: Diagnosis present

## 2023-08-08 DIAGNOSIS — E1151 Type 2 diabetes mellitus with diabetic peripheral angiopathy without gangrene: Secondary | ICD-10-CM | POA: Diagnosis present

## 2023-08-08 DIAGNOSIS — D6869 Other thrombophilia: Secondary | ICD-10-CM | POA: Diagnosis not present

## 2023-08-08 DIAGNOSIS — E876 Hypokalemia: Secondary | ICD-10-CM | POA: Diagnosis not present

## 2023-08-08 DIAGNOSIS — G4733 Obstructive sleep apnea (adult) (pediatric): Secondary | ICD-10-CM | POA: Diagnosis not present

## 2023-08-08 LAB — BASIC METABOLIC PANEL WITH GFR
Anion gap: 7 (ref 5–15)
BUN: 11 mg/dL (ref 8–23)
CO2: 21 mmol/L — ABNORMAL LOW (ref 22–32)
Calcium: 8 mg/dL — ABNORMAL LOW (ref 8.9–10.3)
Chloride: 111 mmol/L (ref 98–111)
Creatinine, Ser: 1.93 mg/dL — ABNORMAL HIGH (ref 0.61–1.24)
GFR, Estimated: 37 mL/min — ABNORMAL LOW (ref >60)
Glucose, Bld: 156 mg/dL — ABNORMAL HIGH (ref 70–99)
Potassium: 3.4 mmol/L — ABNORMAL LOW (ref 3.5–5.1)
Sodium: 139 mmol/L (ref 135–145)

## 2023-08-08 LAB — MAGNESIUM: Magnesium: 1.9 mg/dL (ref 1.7–2.4)

## 2023-08-08 MED ORDER — MAGNESIUM SULFATE 2 GM/50ML IV SOLN
2.0000 g | Freq: Once | INTRAVENOUS | Status: AC
  Administered 2023-08-08: 2 g via INTRAVENOUS
  Filled 2023-08-08: qty 50

## 2023-08-08 MED ORDER — POTASSIUM CHLORIDE CRYS ER 20 MEQ PO TBCR: 40.0000 meq | EXTENDED_RELEASE_TABLET | Freq: Once | ORAL | Status: DC

## 2023-08-08 MED ORDER — ENSURE PLUS HIGH PROTEIN PO LIQD: 237.0000 mL | Freq: Two times a day (BID) | ORAL | Status: DC

## 2023-08-08 MED ORDER — LOSARTAN POTASSIUM 25 MG PO TABS: 25.0000 mg | ORAL_TABLET | Freq: Every day | ORAL | Status: DC

## 2023-08-08 MED ORDER — CARVEDILOL 6.25 MG PO TABS: 6.2500 mg | ORAL_TABLET | Freq: Two times a day (BID) | ORAL | Status: DC

## 2023-08-08 MED ORDER — POTASSIUM CHLORIDE CRYS ER 20 MEQ PO TBCR
40.0000 meq | EXTENDED_RELEASE_TABLET | Freq: Two times a day (BID) | ORAL | Status: DC
  Administered 2023-08-08: 40 meq via ORAL
  Filled 2023-08-08: qty 2

## 2023-08-08 NOTE — Discharge Summary (Signed)
 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 male with medical history significant of chronic hypotension, essential hypertension, COPD, CAD status post CABG, GERD, carotid stenosis stenosis, peripheral vascular disease, chronic venous stasis bilateral lower extremities, hypothyroidism, depression, DM type II, OSA on CPAP, pulmonary hypertension, paroxysmal atrial fibrillation, prolonged QTc, and BPH. Prior right lower extremity ORIF associated infection s/p I&D on 03/31/23 and 04/02/23 ( cx MRSE and enterobacter claoacae), removal of the hardware by Dr. Curtiss Dowdy 06/21/2023,  chronic infection of the ankle discharged hospital 07/22/2023, completed ertapenem  and daptomycin  per ID recommendation on 07/31/2023. On this admission, found to be hypotensive with presyncopal/dizziness sent to ED for evaluation.  Patient has been further  stabilized.     Today, patient denies any new complaints, stable to be discharged to SNF.    Assessment and Plan:  QTc prolongation Improving TSH within normal limit Patient completed a course of antibiotics Cardiology recommends to hold amiodarone  on discharge QTc improved and patient has been cleared by cardiologist for discharge Continue to avoid further QTc prolonging medications include Zofran , Compazine, Atarax , Ativan, Zoloft , from patient's home medication list.   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 Cardiology has recommended holding amiodarone  at discharge in the setting of QT prolongation Continue Eliquis  5 mg twice daily Continue Coreg  twice daily   History of CAD s/p CABG Continue Coreg , Lipitor and Eliquis    History of PAD Continue Lipitor and Eliquis    OSA on CPAP Continue CPAP at bedtime.   Chronic normocytic anemia   Non-insulin -dependent DM type II History of DM type II diet controlled.  A1c within normal range   Generalized anxiety disorder History of depression Continue to hold Zoloft  in the setting of prolonged QTc. Also need to avoid benzodiazepine Atarax  in the setting of prolonged QTc.   BPH Continue prozac   History of COPD Stable Continue ipratropium as needed for wheezing shortness of breath Avoid albuterol  in the setting of prolonged QTc.    Consultants: Cardiology Procedures performed: None Disposition: Skilled nursing facility Diet recommendation:  Cardiac and Carb modified  diet   DISCHARGE MEDICATION: Allergies as of 08/08/2023       Reactions   Beta-lactamase Inhibitors (beta-lactam) Hives, Itching, Rash   AX - penicillin     Ancef  given 9/24 with no obvious reaction   Other Nausea And Vomiting   General Anesthesia - sometimes causes nausea and vomiting * OK to use scopolamine  patch     Wellbutrin [bupropion] Other (See Comments)   Mood Changes   Metformin  And Related Diarrhea, Nausea Only   Sulfa Antibiotics Hives, Other (See Comments)   Bactrim   Tetracyclines & Related Hives, Rash   doxycycline         Medication List     PAUSE taking these medications    amiodarone  200 MG tablet Wait to take this until your doctor or other care provider tells you to start again. Commonly known as: PACERONE  Take 1 tablet (200 mg total) by mouth 2 (two) times daily.   furosemide  40 MG tablet Wait to take this until your doctor or other care provider tells you to start again. Commonly known as: LASIX  Take 40 mg by mouth daily.   midodrine  10 MG tablet Wait to take this until your doctor or other care provider tells you to start again. Commonly known as: PROAMATINE  Take 10 mg by mouth daily at 12 noon. Give 1 tablet by mouth in the afternoon related to Orthostatic HTN. Hold for SBP greater than and notify provider.   sertraline  100 MG tablet Wait to take this until your doctor or other care provider tells you to start again. Commonly known as: ZOLOFT  Take 1 tablet (100 mg total) by mouth daily.       STOP taking these medications    daptomycin  IVPB Commonly known as: CUBICIN    ertapenem  IVPB Commonly known as: INVANZ    fludrocortisone  0.1 MG tablet Commonly known as: FLORINEF        TAKE these medications    acetaminophen  500 MG tablet Commonly known as: TYLENOL  Take 1 tablet (500 mg total) by mouth every 6 (six) hours as needed for mild pain (pain score 1-3).   allopurinol  100 MG tablet Commonly known as: ZYLOPRIM  Take 0.5  tablets (50 mg total) by mouth daily.   atorvastatin  40 MG tablet Commonly known as: LIPITOR Take 1 tablet (40 mg total) by mouth daily. What changed: when to take this   carvedilol  6.25 MG tablet Commonly known as: COREG  Take 1 tablet (6.25 mg total) by mouth 2 (two) times daily with a meal. What changed:  medication strength how much to take when to take this   clopidogrel  75 MG tablet Commonly known as: PLAVIX  Take 75 mg by mouth every morning.   docusate sodium  100 MG capsule Commonly known as: COLACE Take 1 capsule (100 mg total) by mouth 2 (two) times daily.   DULoxetine  20 MG capsule Commonly known as: CYMBALTA  Take 1 capsule (20 mg total) by mouth daily.   Eliquis  5 MG Tabs tablet Generic drug: apixaban  TAKE 1 TABLET BY MOUTH TWICE A DAY   ezetimibe  10 MG tablet Commonly known as: ZETIA  Take 10 mg by mouth every evening.   feeding supplement Liqd Take 237 mLs by mouth 2 (two) times daily between meals. Start taking on: August 09, 2023   ferrous sulfate  325 (65 FE) MG tablet Take 325 mg by mouth 3 (three) times a week. Monday, Wednesday, Friday   finasteride  5 MG tablet Commonly known as: PROSCAR  Take 5 mg by mouth daily.   gabapentin   300 MG capsule Commonly known as: NEURONTIN  Take 1 capsule (300 mg total) by mouth at bedtime.   insulin  lispro 100 UNIT/ML injection Commonly known as: HUMALOG Inject 0-15 Units into the skin in the morning, at noon, in the evening, and at bedtime. Per sliding scale: 0-70= 0 units if <70 implement hypoglycemic protocol & notify MD/NP 71-150= 0 units 151-200=3 units 201-250= 6 units 251-300= 9 units 301-350= 12 units 351-400= 15 units 401-999 if CBG>400. Notify MD/ NP,   Lantus  SoloStar 100 UNIT/ML Solostar Pen Generic drug: insulin  glargine Inject 5 Units into the skin at bedtime.   levothyroxine  88 MCG tablet Commonly known as: SYNTHROID  Take 88 mcg by mouth daily before breakfast.   losartan  25 MG  tablet Commonly known as: COZAAR  Take 1 tablet (25 mg total) by mouth daily. Start taking on: August 09, 2023   magnesium  oxide 400 MG tablet Commonly known as: MAG-OX Take 1 tablet by mouth daily.   methocarbamol  500 MG tablet Commonly known as: ROBAXIN  Take 1 tablet (500 mg total) by mouth every 6 (six) hours as needed for muscle spasms.   Mounjaro  15 MG/0.5ML Pen Generic drug: tirzepatide  Inject 15 mg into the skin once a week. What changed: additional instructions   multivitamin with minerals Tabs tablet Take 1 tablet by mouth daily.   nitroGLYCERIN  0.4 MG SL tablet Commonly known as: NITROSTAT  PLACE 1 TAB UNDER THE TONGUE EVERY 5 MINUTES AS NEEDED FOR CHEST PAIN (SEVERE PRESSURE OR TIGHTNESS)   pantoprazole  40 MG tablet Commonly known as: PROTONIX  TAKE 1 TABLET BY MOUTH EVERY DAY   polyethylene glycol 17 g packet Commonly known as: MIRALAX  / GLYCOLAX  Take 17 g by mouth daily as needed for mild constipation.   PRO-STAT PO Take 30 mLs by mouth 2 (two) times daily.   Vitamin D  (Ergocalciferol ) 1.25 MG (50000 UNIT) Caps capsule Commonly known as: DRISDOL  Take 1 capsule (50,000 Units total) by mouth every 7 (seven) days. What changed: additional instructions        Contact information for after-discharge care     Destination     [facility]   Service: Skilled Nursing Contact information: [address] [place]  [address] [phone]                    Discharge Exam: Cleavon Curls Weights   08/06/23 0417 08/07/23 0538 08/08/23 0510  Weight: 101.6 kg 102.3 kg 102.8 kg   General: NAD  Cardiovascular: S1, S2 present Respiratory: CTAB Abdomen: Soft, nontender, nondistended, bowel sounds present Musculoskeletal: No bilateral pedal edema noted Skin: Normal Psychiatry: Normal mood   Condition at discharge: stable  The results of significant diagnostics from this hospitalization (including imaging,  microbiology, ancillary and laboratory) are listed below for reference.   Imaging Studies: VAS US  ABI WITH/WO TBI Result Date: 08/07/2023  LOWER EXTREMITY DOPPLER STUDY Patient Name:  ABDOUL ENCINAS  Date of Exam:   08/07/2023 Medical Rec #: [id number]             Accession #:    [id number] Date of Birth: 1955-10-10             Patient Gender: M Patient Age:   68 years Exam Location:  [facility] Procedure:      VAS US  ABI WITH/WO TBI Referring Phys: Jorene Kaylor --------------------------------------------------------------------------------  Indications: Peripheral artery disease. High Risk Factors: Hypertension, hyperlipidemia, Diabetes, past history of  smoking, coronary artery disease.  Comparison Study: 11/26/2022 ABI R-0.95 (TBI 0.48), L-0.89 (TBI 0.44) Performing Technologist: Vonzell Guerin RVT  Examination Guidelines: A complete evaluation includes at minimum, Doppler waveform signals and systolic blood pressure reading at the level of bilateral brachial, anterior tibial, and posterior tibial arteries, when vessel segments are accessible. Bilateral testing is considered an integral part of a complete examination. Photoelectric Plethysmograph (PPG) waveforms and toe systolic pressure readings are included as required and additional duplex testing as needed. Limited examinations for reoccurring indications may be performed as noted.  ABI Findings: +-----+------------------+-----+-----------+--------+ RightRt Pressure (mmHg)IndexWaveform   Comment  +-----+------------------+-----+-----------+--------+ PTA  180               1.18 multiphasic         +-----+------------------+-----+-----------+--------+ DP   158               1.04 monophasic          +-----+------------------+-----+-----------+--------+ +--------+------------------+-----+----------+-------+ Left    Lt Pressure (mmHg)IndexWaveform  Comment  +--------+------------------+-----+----------+-------+ [id number]                                      +--------+------------------+-----+----------+-------+ PTA     167               1.10 monophasic        +--------+------------------+-----+----------+-------+ DP      154               1.01 monophasic        +--------+------------------+-----+----------+-------+ +-------+-----------+-----------+------------+------------+ ABI/TBIToday's ABIToday's TBIPrevious ABIPrevious TBI +-------+-----------+-----------+------------+------------+ Right  1.18                  0.95                     +-------+-----------+-----------+------------+------------+ Left   1.10                  0.89                     +-------+-----------+-----------+------------+------------+  Summary: Right: Resting right ankle-brachial index is within normal range. Left: Resting left ankle-brachial index is within normal range. *See table(s) above for measurements and observations.  Electronically signed by Runell Countryman on 08/07/2023 at 5:42:21 PM.    Final    VAS US  LOWER EXTREMITY ARTERIAL DUPLEX Result Date: 08/07/2023 LOWER EXTREMITY ARTERIAL DUPLEX STUDY Patient Name:  CAYDAN MCTAVISH  Date of Exam:   08/07/2023 Medical Rec #: [id number]             Accession #:    [id number] Date of Birth: [birth year]             Patient Gender: M Patient Age:   68 years Exam Location:  [facility] Procedure:      VAS US  LOWER EXTREMITY ARTERIAL DUPLEX Referring Phys: Aashritha Miedema --------------------------------------------------------------------------------  Indications: Peripheral artery disease. High Risk Factors: Hypertension, hyperlipidemia, Diabetes, past history of                    smoking, coronary artery disease.  Vascular Interventions: Prior BLE interventions. Current ABI:            R-1.18                         L-1.10 Comparison Study: 11/26/2022 increased velocities in the left  distal pop.  art.                   and left ATA. Performing Technologist: Vonzell Guerin, RVT  Examination Guidelines: A complete evaluation includes B-mode imaging, spectral Doppler, color Doppler, and power Doppler as needed of all accessible portions of each vessel. Bilateral testing is considered an integral part of a complete examination. Limited examinations for reoccurring indications may be performed as noted.  +----------+--------+-----+--------+-----------+-------------+ RIGHT     PSV cm/sRatioStenosisWaveform   Comments      +----------+--------+-----+--------+-----------+-------------+ CFA Prox  99                   triphasic                +----------+--------+-----+--------+-----------+-------------+ DFA       157                  biphasic                 +----------+--------+-----+--------+-----------+-------------+ SFA Prox  132                  triphasic                +----------+--------+-----+--------+-----------+-------------+ SFA Mid   93                   triphasic                +----------+--------+-----+--------+-----------+-------------+ SFA Distal60                   multiphasic              +----------+--------+-----+--------+-----------+-------------+ POP Prox  69                   multiphasic              +----------+--------+-----+--------+-----------+-------------+ POP Distal123                  multiphasic              +----------+--------+-----+--------+-----------+-------------+ ATA Distal10                                            +----------+--------+-----+--------+-----------+-------------+ PTA Distal89                   multiphasic              +----------+--------+-----+--------+-----------+-------------+ DP        20                   monophasic reversed flow +----------+--------+-----+--------+-----------+-------------+  +----------+--------+-----+---------------+-----------+--------+ LEFT      PSV  cm/sRatioStenosis       Waveform   Comments +----------+--------+-----+---------------+-----------+--------+ CFA Prox  97                          triphasic           +----------+--------+-----+---------------+-----------+--------+ DFA       150                         biphasic            +----------+--------+-----+---------------+-----------+--------+ SFA Prox  134  multiphasic         +----------+--------+-----+---------------+-----------+--------+ SFA Mid   161                         multiphasic         +----------+--------+-----+---------------+-----------+--------+ SFA Distal102                         multiphasic         +----------+--------+-----+---------------+-----------+--------+ POP Prox  67                          multiphasic         +----------+--------+-----+---------------+-----------+--------+ POP Distal432          75-99% stenosis                    +----------+--------+-----+---------------+-----------+--------+ ATA Distal21                          monophasic          +----------+--------+-----+---------------+-----------+--------+ PTA Distal87                          monophasic          +----------+--------+-----+---------------+-----------+--------+ DP        14                          monophasic          +----------+--------+-----+---------------+-----------+--------+  Summary: Right: There is no hemodynamically significant stenosis seen in the right lower extremity. Left: 75-99% stenosis noted in the popliteal artery. There appears to be an increase in the left popliteal artery velocities when compared to the prior exam.   See table(s) above for measurements and observations. Electronically signed by Runell Countryman on 08/07/2023 at 5:42:06 PM.    Final    DG Chest Port 1 View Result Date: 07/21/2023 CLINICAL DATA:  PICC line placement EXAM: PORTABLE CHEST 1 VIEW COMPARISON:  Jul 17, 2023  FINDINGS: The heart size and mediastinal contours are within normal limits. Both lungs are clear. The visualized skeletal structures are unremarkable. Right PICC line tip in the SVC with position. IMPRESSION: No active disease. Electronically Signed   By: Fredrich Jefferson M.D.   On: 07/21/2023 09:02   DG Chest Port 1 View Result Date: 07/17/2023 CLINICAL DATA:  Weakness. EXAM: PORTABLE CHEST 1 VIEW COMPARISON:  05/26/2023 FINDINGS: Normal sized heart with an interval decrease in size. Clear lungs with normal vascularity. Thoracic spine degenerative changes. IMPRESSION: No acute abnormality. Electronically Signed   By: Catherin Closs M.D.   On: 07/17/2023 14:40    Microbiology: Results for orders placed or performed during the hospital encounter of 07/17/23  Blood culture (routine x 2)     Status: None   Collection Time: 07/17/23 12:11 PM   Specimen: BLOOD  Result Value Ref Range Status   Specimen Description BLOOD SITE NOT SPECIFIED  Final   Special Requests   Final    BOTTLES DRAWN AEROBIC AND ANAEROBIC Blood Culture results may not be optimal due to an inadequate volume of blood received in culture bottles   Culture   Final    NO GROWTH 5 DAYS Performed at [facility], [address], [place], [place] [address]    Report Status 07/22/2023 FINAL  Final  Blood culture (routine x 2)     Status: None   Collection Time: 07/17/23  1:43 PM   Specimen: BLOOD LEFT HAND  Result Value Ref Range Status   Specimen Description BLOOD LEFT HAND  Final   Special Requests   Final    BOTTLES DRAWN AEROBIC ONLY Blood Culture results may not be optimal due to an inadequate volume of blood received in culture bottles   Culture   Final    NO GROWTH 5 DAYS Performed at [facility], [address], [place], [place] [address]    Report Status 07/22/2023 FINAL  Final   *Note: Due to a large number of results and/or encounters for the requested time period, some results have not been displayed. A  complete set of results can be found in Results Review.    Labs: CBC: Recent Labs  Lab 08/02/23 0244  WBC 5.6  HGB 10.6*  HCT 32.1*  MCV 87.9  PLT 204   Basic Metabolic Panel: Recent Labs  Lab 08/02/23 0244 08/07/23 0922 08/08/23 0318  NA 140 139 139  K 4.2 2.9* 3.4*  CL 113* 108 111  CO2 21* 21* 21*  GLUCOSE 114* 126* 156*  BUN 12 11 11   CREATININE 1.71* 1.94* 1.93*  CALCIUM  8.1* 8.0* 8.0*  MG  --  1.6* 1.9   Liver Function Tests: No results for input(s): AST, ALT, ALKPHOS, BILITOT, PROT, ALBUMIN  in the last 168 hours. CBG: No results for input(s): GLUCAP in the last 168 hours.  Discharge time spent: greater than 30 minutes.  Signed: Veronica Gordon, MD [facility] 08/08/2023

## 2023-08-08 NOTE — TOC Transition Note (Signed)
 Transition of Care [facility]) - Discharge Note   Patient Details  Name: Harold [name] MRN: [id number] Date of Birth: 11-07-1955  [facility] CM/SW Contact:  Arron Big, LCSWA Phone Number: 08/08/2023, 4:01 PM   Clinical Narrative:   Patient will DC to: [facility] Anticipated DC date: 08/08/23  Family notified: [name] (spouse) Transport by: Lyna Sandhoff   Per MD patient ready for DC to [facility]. RN to call report prior to discharge (report is [phone] opt 0; room 306). RN, patient, patient's family, and facility notified of DC. Discharge Summary and FL2 sent to facility. DC packet on chart. Ambulance transport requested for patient at 3:59PM.   CSW will sign off for now as social work intervention is no longer needed. Please consult us  again if new needs arise.      Final next level of care: Skilled Nursing Facility Barriers to Discharge: Barriers Resolved   Patient Goals and CMS Choice Patient states their goals for this hospitalization and ongoing recovery are:: Return to SNF          Discharge Placement              Patient [facility] bed at: [facility] Patient to be transferred to facility by: [facility] Name of family member notified: [name] (spouse) Patient and family notified of of transfer: 08/08/23  Discharge Plan and Services Additional resources added to the After Visit Summary for   In-house Referral: Clinical Social Work                                   Social Drivers of Health (SDOH) Interventions SDOH Screenings   Food Insecurity: No Food Insecurity (07/31/2023)  Housing: High Risk (07/31/2023)  Transportation Needs: No Transportation Needs (07/31/2023)  Utilities: Not At Risk (07/31/2023)  Depression (PHQ2-9): Low Risk  (07/29/2023)  Social Connections: Moderately Isolated (07/31/2023)  Tobacco Use: Medium Risk (07/30/2023)     Readmission Risk Interventions     No data to display

## 2023-08-08 NOTE — Plan of Care (Signed)

## 2023-08-11 DIAGNOSIS — I4819 Other persistent atrial fibrillation: Secondary | ICD-10-CM | POA: Diagnosis not present

## 2023-08-11 DIAGNOSIS — D649 Anemia, unspecified: Secondary | ICD-10-CM | POA: Diagnosis not present

## 2023-08-11 DIAGNOSIS — I10 Essential (primary) hypertension: Secondary | ICD-10-CM | POA: Diagnosis not present

## 2023-08-11 DIAGNOSIS — R42 Dizziness and giddiness: Secondary | ICD-10-CM | POA: Diagnosis not present

## 2023-08-11 DIAGNOSIS — E039 Hypothyroidism, unspecified: Secondary | ICD-10-CM | POA: Diagnosis not present

## 2023-08-11 DIAGNOSIS — I2510 Atherosclerotic heart disease of native coronary artery without angina pectoris: Secondary | ICD-10-CM | POA: Diagnosis not present

## 2023-08-11 DIAGNOSIS — E1140 Type 2 diabetes mellitus with diabetic neuropathy, unspecified: Secondary | ICD-10-CM | POA: Diagnosis not present

## 2023-08-11 DIAGNOSIS — E876 Hypokalemia: Secondary | ICD-10-CM | POA: Diagnosis not present

## 2023-08-11 DIAGNOSIS — E559 Vitamin D deficiency, unspecified: Secondary | ICD-10-CM | POA: Diagnosis not present

## 2023-08-11 DIAGNOSIS — E1165 Type 2 diabetes mellitus with hyperglycemia: Secondary | ICD-10-CM | POA: Diagnosis not present

## 2023-08-11 DIAGNOSIS — J449 Chronic obstructive pulmonary disease, unspecified: Secondary | ICD-10-CM | POA: Diagnosis not present

## 2023-08-11 DIAGNOSIS — I739 Peripheral vascular disease, unspecified: Secondary | ICD-10-CM | POA: Diagnosis not present

## 2023-08-11 DIAGNOSIS — I70213 Atherosclerosis of native arteries of extremities with intermittent claudication, bilateral legs: Secondary | ICD-10-CM | POA: Diagnosis not present

## 2023-08-11 DIAGNOSIS — Z8709 Personal history of other diseases of the respiratory system: Secondary | ICD-10-CM | POA: Diagnosis not present

## 2023-08-11 DIAGNOSIS — E119 Type 2 diabetes mellitus without complications: Secondary | ICD-10-CM | POA: Diagnosis not present

## 2023-08-12 ENCOUNTER — Other Ambulatory Visit (HOSPITAL_COMMUNITY): Payer: Self-pay

## 2023-08-12 DIAGNOSIS — E559 Vitamin D deficiency, unspecified: Secondary | ICD-10-CM | POA: Diagnosis not present

## 2023-08-12 DIAGNOSIS — S91301A Unspecified open wound, right foot, initial encounter: Secondary | ICD-10-CM | POA: Diagnosis not present

## 2023-08-12 DIAGNOSIS — G4733 Obstructive sleep apnea (adult) (pediatric): Secondary | ICD-10-CM | POA: Diagnosis not present

## 2023-08-12 DIAGNOSIS — Z8709 Personal history of other diseases of the respiratory system: Secondary | ICD-10-CM | POA: Diagnosis not present

## 2023-08-12 DIAGNOSIS — E785 Hyperlipidemia, unspecified: Secondary | ICD-10-CM | POA: Diagnosis not present

## 2023-08-12 DIAGNOSIS — I739 Peripheral vascular disease, unspecified: Secondary | ICD-10-CM | POA: Diagnosis not present

## 2023-08-12 DIAGNOSIS — L89610 Pressure ulcer of right heel, unstageable: Secondary | ICD-10-CM | POA: Diagnosis not present

## 2023-08-12 DIAGNOSIS — E1140 Type 2 diabetes mellitus with diabetic neuropathy, unspecified: Secondary | ICD-10-CM | POA: Diagnosis not present

## 2023-08-12 DIAGNOSIS — E876 Hypokalemia: Secondary | ICD-10-CM | POA: Diagnosis not present

## 2023-08-12 DIAGNOSIS — I4819 Other persistent atrial fibrillation: Secondary | ICD-10-CM | POA: Diagnosis not present

## 2023-08-12 DIAGNOSIS — E119 Type 2 diabetes mellitus without complications: Secondary | ICD-10-CM | POA: Diagnosis not present

## 2023-08-12 DIAGNOSIS — D649 Anemia, unspecified: Secondary | ICD-10-CM | POA: Diagnosis not present

## 2023-08-12 DIAGNOSIS — I2510 Atherosclerotic heart disease of native coronary artery without angina pectoris: Secondary | ICD-10-CM | POA: Diagnosis not present

## 2023-08-12 DIAGNOSIS — R42 Dizziness and giddiness: Secondary | ICD-10-CM | POA: Diagnosis not present

## 2023-08-13 DIAGNOSIS — E559 Vitamin D deficiency, unspecified: Secondary | ICD-10-CM | POA: Diagnosis not present

## 2023-08-13 DIAGNOSIS — R42 Dizziness and giddiness: Secondary | ICD-10-CM | POA: Diagnosis not present

## 2023-08-13 DIAGNOSIS — E1140 Type 2 diabetes mellitus with diabetic neuropathy, unspecified: Secondary | ICD-10-CM | POA: Diagnosis not present

## 2023-08-13 DIAGNOSIS — I2510 Atherosclerotic heart disease of native coronary artery without angina pectoris: Secondary | ICD-10-CM | POA: Diagnosis not present

## 2023-08-13 DIAGNOSIS — I739 Peripheral vascular disease, unspecified: Secondary | ICD-10-CM | POA: Diagnosis not present

## 2023-08-13 DIAGNOSIS — E119 Type 2 diabetes mellitus without complications: Secondary | ICD-10-CM | POA: Diagnosis not present

## 2023-08-13 DIAGNOSIS — Z8709 Personal history of other diseases of the respiratory system: Secondary | ICD-10-CM | POA: Diagnosis not present

## 2023-08-13 DIAGNOSIS — I4819 Other persistent atrial fibrillation: Secondary | ICD-10-CM | POA: Diagnosis not present

## 2023-08-13 DIAGNOSIS — E876 Hypokalemia: Secondary | ICD-10-CM | POA: Diagnosis not present

## 2023-08-13 DIAGNOSIS — D649 Anemia, unspecified: Secondary | ICD-10-CM | POA: Diagnosis not present

## 2023-08-14 ENCOUNTER — Telehealth: Payer: Self-pay | Admitting: Surgery

## 2023-08-14 DIAGNOSIS — I2510 Atherosclerotic heart disease of native coronary artery without angina pectoris: Secondary | ICD-10-CM | POA: Diagnosis not present

## 2023-08-14 DIAGNOSIS — R42 Dizziness and giddiness: Secondary | ICD-10-CM | POA: Diagnosis not present

## 2023-08-14 DIAGNOSIS — D649 Anemia, unspecified: Secondary | ICD-10-CM | POA: Diagnosis not present

## 2023-08-14 DIAGNOSIS — I4819 Other persistent atrial fibrillation: Secondary | ICD-10-CM | POA: Diagnosis not present

## 2023-08-14 DIAGNOSIS — Z8709 Personal history of other diseases of the respiratory system: Secondary | ICD-10-CM | POA: Diagnosis not present

## 2023-08-14 DIAGNOSIS — E1151 Type 2 diabetes mellitus with diabetic peripheral angiopathy without gangrene: Secondary | ICD-10-CM | POA: Diagnosis not present

## 2023-08-14 DIAGNOSIS — E559 Vitamin D deficiency, unspecified: Secondary | ICD-10-CM | POA: Diagnosis not present

## 2023-08-14 DIAGNOSIS — E876 Hypokalemia: Secondary | ICD-10-CM | POA: Diagnosis not present

## 2023-08-14 DIAGNOSIS — E1140 Type 2 diabetes mellitus with diabetic neuropathy, unspecified: Secondary | ICD-10-CM | POA: Diagnosis not present

## 2023-08-14 DIAGNOSIS — S82002D Unspecified fracture of left patella, subsequent encounter for closed fracture with routine healing: Secondary | ICD-10-CM | POA: Diagnosis not present

## 2023-08-14 NOTE — Telephone Encounter (Signed)
-----   Message from Fonda FORBES Rim sent at 08/07/2023  7:30 AM EDT ----- Pt missed appt due to being inpt. Needs rescheduled   - Eli

## 2023-08-15 DIAGNOSIS — Z8709 Personal history of other diseases of the respiratory system: Secondary | ICD-10-CM | POA: Diagnosis not present

## 2023-08-15 DIAGNOSIS — I4819 Other persistent atrial fibrillation: Secondary | ICD-10-CM | POA: Diagnosis not present

## 2023-08-15 DIAGNOSIS — D649 Anemia, unspecified: Secondary | ICD-10-CM | POA: Diagnosis not present

## 2023-08-15 DIAGNOSIS — E1140 Type 2 diabetes mellitus with diabetic neuropathy, unspecified: Secondary | ICD-10-CM | POA: Diagnosis not present

## 2023-08-15 DIAGNOSIS — E876 Hypokalemia: Secondary | ICD-10-CM | POA: Diagnosis not present

## 2023-08-15 DIAGNOSIS — I2510 Atherosclerotic heart disease of native coronary artery without angina pectoris: Secondary | ICD-10-CM | POA: Diagnosis not present

## 2023-08-15 DIAGNOSIS — R42 Dizziness and giddiness: Secondary | ICD-10-CM | POA: Diagnosis not present

## 2023-08-15 DIAGNOSIS — E559 Vitamin D deficiency, unspecified: Secondary | ICD-10-CM | POA: Diagnosis not present

## 2023-08-15 DIAGNOSIS — E1151 Type 2 diabetes mellitus with diabetic peripheral angiopathy without gangrene: Secondary | ICD-10-CM | POA: Diagnosis not present

## 2023-08-15 NOTE — Telephone Encounter (Signed)
 Appt scheduled for 09/08/23

## 2023-08-17 DIAGNOSIS — Z8709 Personal history of other diseases of the respiratory system: Secondary | ICD-10-CM | POA: Diagnosis not present

## 2023-08-17 DIAGNOSIS — I739 Peripheral vascular disease, unspecified: Secondary | ICD-10-CM | POA: Diagnosis not present

## 2023-08-17 DIAGNOSIS — I4819 Other persistent atrial fibrillation: Secondary | ICD-10-CM | POA: Diagnosis not present

## 2023-08-17 DIAGNOSIS — R42 Dizziness and giddiness: Secondary | ICD-10-CM | POA: Diagnosis not present

## 2023-08-17 DIAGNOSIS — E876 Hypokalemia: Secondary | ICD-10-CM | POA: Diagnosis not present

## 2023-08-17 DIAGNOSIS — E559 Vitamin D deficiency, unspecified: Secondary | ICD-10-CM | POA: Diagnosis not present

## 2023-08-17 DIAGNOSIS — E1140 Type 2 diabetes mellitus with diabetic neuropathy, unspecified: Secondary | ICD-10-CM | POA: Diagnosis not present

## 2023-08-17 DIAGNOSIS — I2510 Atherosclerotic heart disease of native coronary artery without angina pectoris: Secondary | ICD-10-CM | POA: Diagnosis not present

## 2023-08-17 DIAGNOSIS — D649 Anemia, unspecified: Secondary | ICD-10-CM | POA: Diagnosis not present

## 2023-08-17 DIAGNOSIS — E119 Type 2 diabetes mellitus without complications: Secondary | ICD-10-CM | POA: Diagnosis not present

## 2023-08-18 ENCOUNTER — Encounter (HOSPITAL_COMMUNITY)

## 2023-08-18 ENCOUNTER — Ambulatory Visit

## 2023-08-18 DIAGNOSIS — E119 Type 2 diabetes mellitus without complications: Secondary | ICD-10-CM | POA: Diagnosis not present

## 2023-08-18 DIAGNOSIS — I4819 Other persistent atrial fibrillation: Secondary | ICD-10-CM | POA: Diagnosis not present

## 2023-08-18 DIAGNOSIS — E876 Hypokalemia: Secondary | ICD-10-CM | POA: Diagnosis not present

## 2023-08-18 DIAGNOSIS — S82891D Other fracture of right lower leg, subsequent encounter for closed fracture with routine healing: Secondary | ICD-10-CM | POA: Diagnosis not present

## 2023-08-18 DIAGNOSIS — I2510 Atherosclerotic heart disease of native coronary artery without angina pectoris: Secondary | ICD-10-CM | POA: Diagnosis not present

## 2023-08-18 DIAGNOSIS — E1140 Type 2 diabetes mellitus with diabetic neuropathy, unspecified: Secondary | ICD-10-CM | POA: Diagnosis not present

## 2023-08-18 DIAGNOSIS — I739 Peripheral vascular disease, unspecified: Secondary | ICD-10-CM | POA: Diagnosis not present

## 2023-08-18 DIAGNOSIS — S82851E Displaced trimalleolar fracture of right lower leg, subsequent encounter for open fracture type I or II with routine healing: Secondary | ICD-10-CM | POA: Diagnosis not present

## 2023-08-18 DIAGNOSIS — D649 Anemia, unspecified: Secondary | ICD-10-CM | POA: Diagnosis not present

## 2023-08-18 DIAGNOSIS — E559 Vitamin D deficiency, unspecified: Secondary | ICD-10-CM | POA: Diagnosis not present

## 2023-08-18 DIAGNOSIS — Z8709 Personal history of other diseases of the respiratory system: Secondary | ICD-10-CM | POA: Diagnosis not present

## 2023-08-18 DIAGNOSIS — R42 Dizziness and giddiness: Secondary | ICD-10-CM | POA: Diagnosis not present

## 2023-08-19 ENCOUNTER — Ambulatory Visit (HOSPITAL_COMMUNITY)
Admission: RE | Admit: 2023-08-19 | Discharge: 2023-08-19 | Disposition: A | Discharge status: Home / Self Care | Source: Ambulatory Visit | Attending: Physician Assistant | Admitting: Physician Assistant

## 2023-08-19 VITALS — BP 78/54 | HR 67 | Ht 73.0 in | Wt 226.0 lb

## 2023-08-19 DIAGNOSIS — E876 Hypokalemia: Secondary | ICD-10-CM | POA: Diagnosis not present

## 2023-08-19 DIAGNOSIS — I739 Peripheral vascular disease, unspecified: Secondary | ICD-10-CM | POA: Diagnosis not present

## 2023-08-19 DIAGNOSIS — D649 Anemia, unspecified: Secondary | ICD-10-CM | POA: Diagnosis not present

## 2023-08-19 DIAGNOSIS — I4819 Other persistent atrial fibrillation: Secondary | ICD-10-CM

## 2023-08-19 DIAGNOSIS — E119 Type 2 diabetes mellitus without complications: Secondary | ICD-10-CM | POA: Diagnosis not present

## 2023-08-19 DIAGNOSIS — I951 Orthostatic hypotension: Secondary | ICD-10-CM | POA: Diagnosis not present

## 2023-08-19 DIAGNOSIS — E1140 Type 2 diabetes mellitus with diabetic neuropathy, unspecified: Secondary | ICD-10-CM | POA: Diagnosis not present

## 2023-08-19 DIAGNOSIS — E559 Vitamin D deficiency, unspecified: Secondary | ICD-10-CM | POA: Diagnosis not present

## 2023-08-19 DIAGNOSIS — I2510 Atherosclerotic heart disease of native coronary artery without angina pectoris: Secondary | ICD-10-CM | POA: Diagnosis not present

## 2023-08-19 DIAGNOSIS — R42 Dizziness and giddiness: Secondary | ICD-10-CM | POA: Diagnosis not present

## 2023-08-19 DIAGNOSIS — I4891 Unspecified atrial fibrillation: Secondary | ICD-10-CM | POA: Diagnosis not present

## 2023-08-19 DIAGNOSIS — Z8709 Personal history of other diseases of the respiratory system: Secondary | ICD-10-CM | POA: Diagnosis not present

## 2023-08-19 DIAGNOSIS — D6869 Other thrombophilia: Secondary | ICD-10-CM | POA: Diagnosis not present

## 2023-08-19 NOTE — Progress Notes (Signed)
 Primary Care Physician: Caleen Dirks, MD Primary Cardiologist: [name], MD Electrophysiologist: [name], MD  Referring Physician: Dr [name] is a 68 y.o. male with a history of carotid disease, obstructive sleep apnea on CPAP, peripheral arterial disease, essential hypertension, obesity, chronic kidney disease, type 2 diabetes, CAD, hyperlipidemia, atrial fibrillation who presents for follow up in the [facility]. The patient was initially diagnosed with atrial fibrillation 01/28/22 at his visit with Dr Clay. Patient had noticed more fatigue with exertion for the previous two months but was otherwise unaware of his arrhythmia. He was started on Eliquis  for a CHADS2VASC score of 4. An echo and cardiac monitor were also ordered. He denies significant alcohol use and is compliant with his CPAP. The cardiac monitor showed 100% afib burden.   Patient is s/p DCCV on 03/13/22. Patient reports that after his DCCV he felt less SOB. He was back out of rhythm on follow up 03/27/22. Seen by Dr Furbish to discuss rhythm control options who recommended medical therapy. He was able to wean off Abilify  and started on amiodarone  06/24/22. He is s/p repeat DCCV on 07/25/22. He has had several hospitalizations following an right lower extremity ORIF complicated by infection. He was hospitalized 07/29/23 and found to have a long QT interval. Suspected due to combination of amiodarone , SSRIs, and antibiotics.  Patient returns for follow up for atrial fibrillation. He remains in SR. Despite that, he feels poorly today. He did becomes dizzy when sitting upright during her visit today, BP low. No bleeding issues on anticoagulation.   Today, he  denies symptoms of palpitations, chest pain, shortness of breath, orthopnea, PND, lower extremity edema, syncope, snoring, daytime somnolence, bleeding, or neurologic sequela. The patient is tolerating medications without  difficulties and is otherwise without complaint today.    ROS- All systems are reviewed and negative except as per the HPI above.   Atrial Fibrillation Risk Factors:  he does have symptoms or diagnosis of sleep apnea. he does not have a history of rheumatic fever. he does not have a history of alcohol use.   Atrial Fibrillation Management history:  Previous antiarrhythmic drugs: amiodarone  Previous cardioversions: 03/13/22 Previous ablations: none Anticoagulation history: Eliquis    Physical Exam: BP (!) 78/54   Pulse 67   Ht 6' 1 (1.854 m)   Wt 102.5 kg Comment: not able to obtain wt  BMI 29.82 kg/m   GEN: Well nourished, well developed in no acute distress, pallor NECK: No JVD CARDIAC: Regular rate and rhythm, no murmurs, rubs, gallops RESPIRATORY:  Clear to auscultation without rales, wheezing or rhonchi  ABDOMEN: Soft, non-tender, non-distended EXTREMITIES:  No edema; No deformity    EKG today demonstrates  SR, NST, IVCD Vent. rate 67 BPM PR interval 184 ms QRS duration 112 ms QT/QTcB 438/462 ms   Echo 02/21/22 demonstrated   1. EF challenging to assess with afib/ challenging windows. Left  ventricular ejection fraction, by estimation, is 50 to 55%. The left  ventricle has low normal function. The left ventricle demonstrates global  hypokinesis. There is mild concentric left ventricular hypertrophy. Left ventricular diastolic parameters are indeterminate.   2. Right ventricular systolic function is normal. The right ventricular  size is normal.   3. Left atrial size was mildly dilated.   4. Trivial mitral valve regurgitation.   5. There is mild calcification of the aortic valve. Aortic valve  regurgitation is not visualized.   6. There is mild  dilatation of the ascending aorta, measuring 36 mm.   7. The inferior vena cava is normal in size with greater than 50%  respiratory variability, suggesting right atrial pressure of 3 mmHg.   CHA2DS2-VASc Score = 4   The patient's score is based upon: CHF History: 0 HTN History: 1 Diabetes History: 1 Stroke History: 0 Vascular Disease History: 1 Age Score: 1 Gender Score: 0       ASSESSMENT AND PLAN: Persistent Atrial Fibrillation (ICD10:  I48.19) The patient's CHA2DS2-VASc score is 4, indicating a 4.8% annual risk of stroke.   Patient in SR today. Amiodarone  discontinued due to prolonged QT in setting of antibiotic and SSRI use. We discussed rhythm control options today. Hesitant to resume AAD given prolonged QT (460s ms today). Not a candidate for class IC with h/o CAD. Patient in agreement to pursue watchful waiting for now. If his infection resolves and functional status improves he could be a candidate for ablation with his significant weight loss. Otherwise his rhythm options are very limited.  Continue Eliquis  5 mg BID  Secondary Hypercoagulable State (ICD10:  D68.69) The patient is at significant risk for stroke/thromboembolism based upon his CHA2DS2-VASc Score of 4.  Continue Apixaban  (Eliquis ). Not felt to be a candidate for Watchman (10/18/22).  OSA  Encouraged nightly CPAP  HTN Recently with significant orthostatic hypotension.  He briefly became hypotensive in the exam room, improved with leaning chair back. Will discontinue carvedilol  today. Defer midodrine  restart to his primary cardiology team.   CAD S/p several stents No anginal symptoms Followed by Dr Anner  PAD Followed by Dr Darron   Follow up with Dr Anner or APP in two weeks. Follow up with with Dr Nancey in 3 months.    Daril Kicks PA-C [facility] [address] [place], [place] [address] [phone]

## 2023-08-19 NOTE — Patient Instructions (Addendum)
Stop coreg

## 2023-08-20 DIAGNOSIS — E559 Vitamin D deficiency, unspecified: Secondary | ICD-10-CM | POA: Diagnosis not present

## 2023-08-20 DIAGNOSIS — E785 Hyperlipidemia, unspecified: Secondary | ICD-10-CM | POA: Diagnosis not present

## 2023-08-20 DIAGNOSIS — R42 Dizziness and giddiness: Secondary | ICD-10-CM | POA: Diagnosis not present

## 2023-08-20 DIAGNOSIS — E876 Hypokalemia: Secondary | ICD-10-CM | POA: Diagnosis not present

## 2023-08-20 DIAGNOSIS — I739 Peripheral vascular disease, unspecified: Secondary | ICD-10-CM | POA: Diagnosis not present

## 2023-08-20 DIAGNOSIS — I4819 Other persistent atrial fibrillation: Secondary | ICD-10-CM | POA: Diagnosis not present

## 2023-08-20 DIAGNOSIS — E119 Type 2 diabetes mellitus without complications: Secondary | ICD-10-CM | POA: Diagnosis not present

## 2023-08-20 DIAGNOSIS — E1140 Type 2 diabetes mellitus with diabetic neuropathy, unspecified: Secondary | ICD-10-CM | POA: Diagnosis not present

## 2023-08-20 DIAGNOSIS — D649 Anemia, unspecified: Secondary | ICD-10-CM | POA: Diagnosis not present

## 2023-08-20 DIAGNOSIS — Z8709 Personal history of other diseases of the respiratory system: Secondary | ICD-10-CM | POA: Diagnosis not present

## 2023-08-20 DIAGNOSIS — G4733 Obstructive sleep apnea (adult) (pediatric): Secondary | ICD-10-CM | POA: Diagnosis not present

## 2023-08-20 DIAGNOSIS — I2510 Atherosclerotic heart disease of native coronary artery without angina pectoris: Secondary | ICD-10-CM | POA: Diagnosis not present

## 2023-08-21 DIAGNOSIS — I739 Peripheral vascular disease, unspecified: Secondary | ICD-10-CM | POA: Diagnosis not present

## 2023-08-21 DIAGNOSIS — E119 Type 2 diabetes mellitus without complications: Secondary | ICD-10-CM | POA: Diagnosis not present

## 2023-08-21 DIAGNOSIS — I4819 Other persistent atrial fibrillation: Secondary | ICD-10-CM | POA: Diagnosis not present

## 2023-08-21 DIAGNOSIS — E559 Vitamin D deficiency, unspecified: Secondary | ICD-10-CM | POA: Diagnosis not present

## 2023-08-21 DIAGNOSIS — I2510 Atherosclerotic heart disease of native coronary artery without angina pectoris: Secondary | ICD-10-CM | POA: Diagnosis not present

## 2023-08-21 DIAGNOSIS — Z8709 Personal history of other diseases of the respiratory system: Secondary | ICD-10-CM | POA: Diagnosis not present

## 2023-08-21 DIAGNOSIS — R42 Dizziness and giddiness: Secondary | ICD-10-CM | POA: Diagnosis not present

## 2023-08-21 DIAGNOSIS — D649 Anemia, unspecified: Secondary | ICD-10-CM | POA: Diagnosis not present

## 2023-08-21 DIAGNOSIS — E1140 Type 2 diabetes mellitus with diabetic neuropathy, unspecified: Secondary | ICD-10-CM | POA: Diagnosis not present

## 2023-08-21 DIAGNOSIS — E876 Hypokalemia: Secondary | ICD-10-CM | POA: Diagnosis not present

## 2023-08-22 DIAGNOSIS — E1140 Type 2 diabetes mellitus with diabetic neuropathy, unspecified: Secondary | ICD-10-CM | POA: Diagnosis not present

## 2023-08-22 DIAGNOSIS — R42 Dizziness and giddiness: Secondary | ICD-10-CM | POA: Diagnosis not present

## 2023-08-22 DIAGNOSIS — Z8709 Personal history of other diseases of the respiratory system: Secondary | ICD-10-CM | POA: Diagnosis not present

## 2023-08-22 DIAGNOSIS — I739 Peripheral vascular disease, unspecified: Secondary | ICD-10-CM | POA: Diagnosis not present

## 2023-08-22 DIAGNOSIS — E559 Vitamin D deficiency, unspecified: Secondary | ICD-10-CM | POA: Diagnosis not present

## 2023-08-22 DIAGNOSIS — E119 Type 2 diabetes mellitus without complications: Secondary | ICD-10-CM | POA: Diagnosis not present

## 2023-08-22 DIAGNOSIS — E876 Hypokalemia: Secondary | ICD-10-CM | POA: Diagnosis not present

## 2023-08-22 DIAGNOSIS — I2510 Atherosclerotic heart disease of native coronary artery without angina pectoris: Secondary | ICD-10-CM | POA: Diagnosis not present

## 2023-08-22 DIAGNOSIS — I4819 Other persistent atrial fibrillation: Secondary | ICD-10-CM | POA: Diagnosis not present

## 2023-08-22 DIAGNOSIS — D649 Anemia, unspecified: Secondary | ICD-10-CM | POA: Diagnosis not present

## 2023-08-25 ENCOUNTER — Ambulatory Visit: Admitting: Internal Medicine

## 2023-08-25 ENCOUNTER — Telehealth: Payer: Self-pay

## 2023-08-25 DIAGNOSIS — E559 Vitamin D deficiency, unspecified: Secondary | ICD-10-CM | POA: Diagnosis not present

## 2023-08-25 DIAGNOSIS — I4819 Other persistent atrial fibrillation: Secondary | ICD-10-CM | POA: Diagnosis not present

## 2023-08-25 DIAGNOSIS — I739 Peripheral vascular disease, unspecified: Secondary | ICD-10-CM | POA: Diagnosis not present

## 2023-08-25 DIAGNOSIS — I2510 Atherosclerotic heart disease of native coronary artery without angina pectoris: Secondary | ICD-10-CM | POA: Diagnosis not present

## 2023-08-25 DIAGNOSIS — S82851E Displaced trimalleolar fracture of right lower leg, subsequent encounter for open fracture type I or II with routine healing: Secondary | ICD-10-CM | POA: Diagnosis not present

## 2023-08-25 DIAGNOSIS — R42 Dizziness and giddiness: Secondary | ICD-10-CM | POA: Diagnosis not present

## 2023-08-25 DIAGNOSIS — D649 Anemia, unspecified: Secondary | ICD-10-CM | POA: Diagnosis not present

## 2023-08-25 DIAGNOSIS — Z8709 Personal history of other diseases of the respiratory system: Secondary | ICD-10-CM | POA: Diagnosis not present

## 2023-08-25 DIAGNOSIS — E119 Type 2 diabetes mellitus without complications: Secondary | ICD-10-CM | POA: Diagnosis not present

## 2023-08-25 DIAGNOSIS — T84418D Breakdown (mechanical) of other internal orthopedic devices, implants and grafts, subsequent encounter: Secondary | ICD-10-CM | POA: Diagnosis not present

## 2023-08-25 DIAGNOSIS — E876 Hypokalemia: Secondary | ICD-10-CM | POA: Diagnosis not present

## 2023-08-25 DIAGNOSIS — E1140 Type 2 diabetes mellitus with diabetic neuropathy, unspecified: Secondary | ICD-10-CM | POA: Diagnosis not present

## 2023-08-25 NOTE — Telephone Encounter (Signed)
 Called [facility] to reschedule missed appointment. Left a voice message for the scheduler at [facility] [phone]) to contact office.

## 2023-08-26 ENCOUNTER — Ambulatory Visit: Attending: Cardiovascular Disease | Admitting: Cardiovascular Disease

## 2023-08-26 VITALS — BP 60/40 | Ht 73.0 in | Wt 226.0 lb

## 2023-08-26 DIAGNOSIS — E785 Hyperlipidemia, unspecified: Secondary | ICD-10-CM | POA: Diagnosis not present

## 2023-08-26 DIAGNOSIS — I2510 Atherosclerotic heart disease of native coronary artery without angina pectoris: Secondary | ICD-10-CM | POA: Diagnosis not present

## 2023-08-26 DIAGNOSIS — I4819 Other persistent atrial fibrillation: Secondary | ICD-10-CM

## 2023-08-26 DIAGNOSIS — Z8709 Personal history of other diseases of the respiratory system: Secondary | ICD-10-CM | POA: Diagnosis not present

## 2023-08-26 DIAGNOSIS — I739 Peripheral vascular disease, unspecified: Secondary | ICD-10-CM | POA: Diagnosis not present

## 2023-08-26 DIAGNOSIS — I951 Orthostatic hypotension: Secondary | ICD-10-CM

## 2023-08-26 DIAGNOSIS — R42 Dizziness and giddiness: Secondary | ICD-10-CM | POA: Diagnosis not present

## 2023-08-26 DIAGNOSIS — E119 Type 2 diabetes mellitus without complications: Secondary | ICD-10-CM | POA: Diagnosis not present

## 2023-08-26 DIAGNOSIS — D649 Anemia, unspecified: Secondary | ICD-10-CM | POA: Diagnosis not present

## 2023-08-26 DIAGNOSIS — E559 Vitamin D deficiency, unspecified: Secondary | ICD-10-CM | POA: Diagnosis not present

## 2023-08-26 DIAGNOSIS — E876 Hypokalemia: Secondary | ICD-10-CM | POA: Diagnosis not present

## 2023-08-26 DIAGNOSIS — E1140 Type 2 diabetes mellitus with diabetic neuropathy, unspecified: Secondary | ICD-10-CM | POA: Diagnosis not present

## 2023-08-26 NOTE — Patient Instructions (Signed)
 Medication Instructions:  STOP the Furosemide  STOP the Losartan   *If you need a refill on your cardiac medications before your next appointment, please call your pharmacy*  Lab Work: None ordered If you have labs (blood work) drawn today and your tests are completely normal, you will receive your results only by: MyChart Message (if you have MyChart) OR A paper copy in the mail If you have any lab test that is abnormal or we need to change your treatment, we will call you to review the results.  Testing/Procedures: None ordered  Follow-Up: At [facility], you and your health needs are our priority.  As part of our continuing mission to provide you with exceptional heart care, our providers are all part of one team.  This team includes your primary Cardiologist (physician) and Advanced Practice Providers or APPs (Physician Assistants and Nurse Practitioners) who all work together to provide you with the care you need, when you need it.  Your next appointment:   3 months with Dr. Anner or APP 6 months with Dr. Darron

## 2023-08-26 NOTE — Progress Notes (Signed)
 Cardiology Office Note   Date:  08/26/2023   ID:  Harold [name], DOB 1955-07-19, MRN [id number]  PCP:  Caleen Dirks, MD  Cardiologist: Dr. Anner.  No chief complaint on file.      History of Present Illness: Harold [name] is a 68 y.o. male who is here today for follow-up visit regarding peripheral arterial disease.    He has known history of carotid disease, obstructive sleep apnea on CPAP, peripheral arterial disease, essential hypertension, obesity, chronic kidney disease, type 2 diabetes , Atrial fibrillation and hyperlipidemia.  He has known history of coronary artery disease with previous stenting.  He is not a smoker. He was seen in 2021 for bilateral calf claudication worse on the right. He underwent noninvasive vascular evaluation which showed an ABI of 0.72 on the right and 0.90 on the left. Duplex showed severe disease in the right TP trunk with occluded anterior tibial. On the left, the anterior tibial was occluded. Angiography was performed in August 2021 which showed mild nonobstructive iliac disease.  Imaging of the right lower extremity showed no obstructive disease affecting the SFA or popliteal artery.  There was significant calcified stenosis in the TP trunk with occluded anterior tibial artery and significant disease in the proximal posterior tibial artery which was the dominant vessel below the knee.  I performed successful balloon angioplasty of the right TP trunk into the posterior tibial artery followed by drug-coated balloon angioplasty to the TP trunk.   He developed atrial fibrillation in 2023 and has been treated with amiodarone  and anticoagulation.  He had recurrent wounds on the left lower extremity in 2024.  Angiography was done in September 2024 which showed severe calcified stenosis in the mid popliteal artery with extension into the TP trunk and bifurcation of the posterior tibial and peroneal arteries.  The anterior tibial artery was occluded  proximally.  I performed successful orbital atherectomy and drug-coated balloon angioplasty to the left popliteal artery, TP trunk and posterior tibial artery.  The wounds of the left lower extremity healed.  Unfortunately, he fell last year and fractured his right leg.  He had surgical repair but returned with infected hardware.  There was concern about arterial insufficiency and he was seen by Dr. Serene who performed an angiogram which showed heavily calcified severe stenosis in the distal right SFA.  This was treated with Eluvia drug-eluting stent placement.  Most recent Doppler studies in June showed normal ABI bilaterally.  Duplex showed relatively normal velocities on the right side.  On the left, there was significant stenosis in the distal popliteal artery  He had recurrent hospitalizations due to issues with infected hardware requiring prolonged antibiotics and he developed acute on chronic kidney disease requiring brief dialysis.  In that setting, he developed severe orthostatic hypotension requiring discontinuation of antihypertensive medications.   He had another hospitalization in June for hypotension and syncope.  He had another hospitalization in the same month with acute on chronic kidney disease.  He reports overall improvement in his strength and ability to attend physical therapy.  His blood pressure continues to be low but according to him it improves quickly when he lies down.  No lower extremity ulceration.   Past Medical History:  Diagnosis Date   Anemia    Anginal pain (HCC) 04/2013   Cardiac cath showed patent stents with distal LAD, circumflex-OM and RCA disease in small vessels.   Anxiety    Atrial fibrillation (HCC) 12/2021   CAD S/P percutaneous  coronary angioplasty 2003, 04/2012   status post PCI to LAD, circumflex-OM 2, RCA   Carotid artery occlusion    Chronic renal insufficiency, stage II (mild)    Chronic venous insufficiency    varicosities, no reflux;  dopplers 04/14/12- valvular insufficiency in the R and L GSV   Complication of anesthesia    COPD, mild (HCC)    Depression    situaltional    Diabetes mellitus    Diabetic neuropathy (HCC) 08/24/2019   Dyslipidemia associated with type 2 diabetes mellitus (HCC)    Fall at home, initial encounter 01/02/2023   GERD (gastroesophageal reflux disease)    History of cardioversion 12/2021   at [facility]   HTN (hypertension)    Hypothyroidism    Neuromuscular disorder (HCC)    neuropathy in feet   Neuropathy    notably improved following PCI with improved cardiac function   Obesity    Obesity, Class II, BMI 35.0-39.9, with comorbidity (see actual BMI)    BMI 39; wgt loss efforts in place; seeing Dietician   Open ankle fracture 01/02/2023   Orthostatic hypotension    PONV (postoperative nausea and vomiting)    [place]   Pulmonary hypertension (HCC) 04/2012   PA pressure   Sleep apnea    uses nightly   Vitamin D  deficiency    per facility notes    Past Surgical History:  Procedure Laterality Date   ABDOMINAL AORTAGRAM N/A 11/15/2011   Procedure: ABDOMINAL EZELLA;  Surgeon: Carlin FORBES Haddock, MD;  Location: [facility] CATH LAB;  Service: Cardiovascular;  [name];   ABDOMINAL AORTOGRAM W/LOWER EXTREMITY N/A 10/13/2019   Procedure: ABDOMINAL AORTOGRAM W/LOWER EXTREMITY;  Surgeon: Darron Deatrice LABOR, MD;  Location: [facility] INVASIVE CV LAB;  Service: CV: Non-obst Ao-Iliac. R SFA-PopA patent with significant calcific stenosis of TP trunk-prox PTA (dominant) & CTO ATA  -> R PTA-TP PTCA   ABDOMINAL AORTOGRAM W/LOWER EXTREMITY N/A 11/06/2022   Procedure: ABDOMINAL AORTOGRAM W/LOWER EXTREMITY;  Surgeon: Darron Deatrice LABOR, MD;  Location: [facility] INVASIVE CV LAB;  Service: Cardiovascular;  [name];   ABDOMINAL AORTOGRAM W/LOWER EXTREMITY Right 04/01/2023   Procedure: ABDOMINAL AORTOGRAM W/LOWER EXTREMITY;  Surgeon: Serene Gaile ORN, MD;  Location: [facility] INVASIVE CV LAB;  Service:  Cardiovascular;  Laterality: Right;   ABIs  04/27/2012   mild bilateral arterial insufficiency   ANKLE FUSION Right 05/15/2023   Procedure: ARTHRODESIS ANKLE WITH HINDFOOT FUSION NAIL;  Surgeon: Kendal Franky SQUIBB, MD;  [name];  Service: Orthopedics;  Laterality: Right;   APPLICATION OF WOUND VAC Right 03/31/2023   Procedure: APPLICATION OF WOUND VAC;  Surgeon: Kendal Franky SQUIBB, MD;  [name];  Service: Orthopedics;  Laterality: Right;   BACK SURGERY  2005 x1   X2-2010   BUNIONECTOMY Right 11/11/2013   Procedure: RIGHT FOOT SILVER BUNIONECTOMY;  Surgeon: Norleen Armor, MD;  Location: [facility];  Service: Orthopedics;  Laterality: Right;   CARDIAC CATHETERIZATION  12/11/2001   significant 3V CAD, normal LV function   CARDIOVERSION N/A 03/13/2022   Procedure: CARDIOVERSION;  Surgeon: Kate Lonni CROME, MD;  Location: [facility] ENDOSCOPY;  Service: Cardiovascular;  [name];   CARDIOVERSION N/A 07/25/2022   Procedure: CARDIOVERSION;  Surgeon: Jeffrie Oneil BROCKS, MD;  [facility]: [facility] INVAS[facility] CV LAB;  Service: Cardiovascular;  [name];   Carotid Doppler  04/27/2012   right internal carotid: Elevated velocities but no evidence of plaque. Left internal carotid 40-59%   CAROTID ENDARTERECTOMY  2005  Right; recent carotid Dopplers notes elevated velocities.    colonscopy     CORONARY ANGIOPLASTY  12/21/2001   PTCA of the distal and mid AV groove circ, unsuccessful PTCA of second OM total occlusion, unsuccessful PTCA of the apical LAD total occlusion   CORONARY ANGIOPLASTY WITH STENT PLACEMENT  04/29/2012   PCI to 3 RCA lesions, Promus Premiere 2.62mmx8mm distally, mid was 2.31mx28mm and proximally 2.75x60mm, EF 55-60%   CORONARY STENT PLACEMENT  04/28/2012   PCI to LAD (3x64mm Xience DES postdilated to 3.25) and circ prox and mid (2 overlappinmg 2.68mmx12mmXience DES postdilated to 2.78mm)   DOPPLER ECHOCARDIOGRAPHY  04/28/2012   poor quality study: EF estimated  60-65%; unable to assess diastolic function (previously noted to have diastolic dysfunction); severely dilated left atrium and mild right atrium; dilated IVC consistent with increased central venous pressure.SABRA   EXTERNAL FIXATION LEG Right 05/12/2023   Procedure: EXTERNAL FIXATION, LOWER EXTREMITY;  Surgeon: Kendal Franky SQUIBB, MD;  [name];  Service: Orthopedics;  Laterality: Right;   HARDWARE REMOVAL Right 03/31/2023   Procedure: HARDWARE REMOVAL RIGHT ANKLE;  Surgeon: Kendal Franky SQUIBB, MD;  [name];  Service: Orthopedics;  Laterality: Right;   HARDWARE REMOVAL Right 01/28/2023   Procedure: REMOVAL OF HARDWARE RIGHT ANKLE;  [name], MD;  [name];  Service: Orthopedics;  Laterality: Right;   HARDWARE REMOVAL Right 06/21/2023   Procedure: REMOVAL, HARDWARE;  Surgeon: Kendal Franky SQUIBB, MD;  [name];  Service: Orthopedics;  Laterality: Right;   HERNIA REPAIR     I & D EXTREMITY Right 01/02/2023   Procedure: IRRIGATION AND DEBRIDEMENT RIGHT ANKLE;  [name], MD;  [name];  Service: Orthopedics;  Laterality: Right;   I & D EXTREMITY Right 04/02/2023   Procedure: DEBRIDEMENT RIGHT ANKLE;  Surgeon: Harden Jerona GAILS, MD;  Location: [facility] OR;  Service: Orthopedics;  Laterality: Right;   IR FLUORO GUIDE CV LINE LEFT  04/22/2023   IR FLUORO GUIDE CV LINE RIGHT  04/07/2023   IR PATIENT EVAL TECH 0-60 MINS  04/07/2023   IR RADIOLOGIST EVAL & MGMT  04/08/2023   IR REMOVAL TUN CV CATH W/O FL  05/05/2023   IR US  GUIDE VASC ACCESS LEFT  04/22/2023   IR US  GUIDE VASC ACCESS RIGHT  04/07/2023   LEFT AND RIGHT HEART CATHETERIZATION WITH CORONARY ANGIOGRAM N/A 04/27/2012   Procedure: LEFT AND RIGHT HEART CATHETERIZATION WITH CORONARY ANGIOGRAM;  [name], MD;  Location: [facility] CATH LAB;  Service: Cardiovascular;  [name];   LEFT AND RIGHT HEART CATHETERIZATION WITH CORONARY ANGIOGRAM N/A 05/14/2013   Procedure: LEFT AND RIGHT HEART  CATHETERIZATION WITH CORONARY ANGIOGRAM;  [name], MD;  [name]; PCWP ;; [name], [name], Patent RCA ostial mid and distal stents - 70% distal runoff Dz   LUMBAR LAMINECTOMY/DECOMPRESSION MICRODISCECTOMY  01/07/2012   Procedure: LUMBAR LAMINECTOMY/DECOMPRESSION MICRODISCECTOMY 1 LEVEL;  [name], MD;  [name];  Service: Neurosurgery;  Laterality: Bilateral;   Lumbar Three-Four Decompression   LUMBAR LAMINECTOMY/DECOMPRESSION MICRODISCECTOMY N/A 07/26/2020   Procedure: Lumbar two-three Laminectomy/Foraminotomy;  [name], MD;  [name];  Service: Neurosurgery;  [name];   ORIF ANKLE FRACTURE Right 01/02/2023   Procedure: OPEN REDUCTION INTERNAL FIXATION (ORIF)RIGHT ANKLE FRACTURE;  [name],  MD;  [name];  Service: Orthopedics;  Laterality: Right;   PERCUTANEOUS CORONARY STENT INTERVENTION (PCI-S) N/A 04/28/2012   Procedure: PERCUTANEOUS CORONARY STENT INTERVENTION (PCI-S);  [name], MD;  Location: [facility] CATH LAB;  Service: Cardiovascular;  [name];   PERCUTANEOUS CORONARY STENT INTERVENTION (PCI-S) N/A 04/29/2012   Procedure: PERCUTANEOUS CORONARY STENT INTERVENTION (PCI-S);  [name], MD;  Location: [facility] CATH LAB;  Service: Cardiovascular;  [name];   PERIPHERAL VASCULAR ATHERECTOMY  11/06/2022   Procedure: PERIPHERAL VASCULAR ATHERECTOMY;  Surgeon: Darron Deatrice DELENA, MD;  [facility];  Service: Cardiovascular;;   PERIPHERAL VASCULAR BALLOON ANGIOPLASTY Right 10/13/2019   Procedure: PERIPHERAL VASCULAR BALLOON ANGIOPLASTY;  Surgeon: Darron Deatrice DELENA, MD;  [facility] CV [facility];  Service: Cardiovascular;  Laterality: Right;  PTCA of R TP Trunk into PTA (Drug-coated) => Follow-up ABIs  0.9 on the right and 0.99 on the left.   PERIPHERAL VASCULAR BALLOON ANGIOPLASTY Right 04/01/2023   Procedure: PERIPHERAL VASCULAR BALLOON ANGIOPLASTY;  Surgeon: Serene Gaile LELON, MD;  Location[facility]VAS[facility] CV LAB;  Service: Cardiovascular;  Laterality: Right;   PERIPHERAL VASCULAR INTERVENTION Right 04/01/2023   Procedure: PERIPHERAL VASCULAR INTERVENTION;  Surgeon: Serene Gaile LELON, MD;  Location: [facility] INVAS[facility] CV LAB;  Service: Cardiovascular;  Laterality: Right;   SPINE SURGERY     UMBILICAL HERNIA REPAIR  2009   steel mesh insert     Current Outpatient Medications  Medication Sig Dispense Refill   acetaminophen  (TYLENOL ) 500 MG tablet Take 1 tablet (500 mg total) by mouth every 6 (six) hours as needed for mild pain (pain score 1-3).     allopurinol  (ZYLOPRIM ) 100 MG tablet Take 0.5 tablets (50 mg total) by mouth daily. 15 tablet 0   Amino Acids-Protein Hydrolys (PRO-STAT PO) Take 30 mLs by mouth 2 (two) times daily.     apixaban  (ELIQUIS ) 5 MG TABS tablet TAKE 1 TABLET BY MOUTH TWICE A DAY 60 tablet 6   atorvastatin  (LIPITOR) 40 MG tablet Take 1 tablet (40 mg total) by mouth daily. 30 tablet 0   clopidogrel  (PLAVIX ) 75 MG tablet Take 75 mg by mouth every morning.     docusate sodium  (COLACE) 100 MG capsule Take 1 capsule (100 mg total) by mouth 2 (two) times daily. 10 capsule 0   DULoxetine  (CYMBALTA ) 20 MG capsule Take 1 capsule (20 mg total) by mouth daily.     ezetimibe  (ZETIA ) 10 MG tablet Take 10 mg by mouth every evening.     feeding supplement (ENSURE PLUS HIGH PROTEIN) LIQD Take 237 mLs by mouth 2 (two) times daily between meals.     ferrous sulfate  325 (65 FE) MG tablet Take 325 mg by mouth 3 (three) times a week. Monday, Wednesday, Friday     finasteride  (PROSCAR ) 5 MG tablet Take 5 mg by mouth daily.     gabapentin  (NEURONTIN ) 300 MG capsule Take 1 capsule (300 mg total) by mouth at bedtime. 30 capsule 0   insulin  glargine (LANTUS  SOLOSTAR) 100 UNIT/ML Solostar Pen Inject 5  Units into the skin at bedtime. 15 mL 11   insulin  lispro (HUMALOG) 100 UNIT/ML injection Inject 0-15 Units into the skin in the morning, at noon, in the evening, and at bedtime. Per sliding scale: 0-70= 0 units if <70 implement hypoglycemic protocol & notify MD/NP 71-150= 0 units 151-200=3 units 201-250= 6 units 251-300= 9 units 301-350= 12 units 351-400= 15 units 401-999 if CBG>400. Notify MD/ NP,  levothyroxine  (SYNTHROID , LEVOTHROID) 88 MCG tablet Take 88 mcg by mouth daily before breakfast.     magnesium  oxide (MAG-OX) 400 MG tablet Take 1 tablet by mouth daily.     melatonin 3 MG TABS tablet Take 3 mg by mouth at bedtime.     methocarbamol  (ROBAXIN ) 500 MG tablet Take 1 tablet (500 mg total) by mouth every 6 (six) hours as needed for muscle spasms. 28 tablet 0   [Paused] midodrine  (PROAMATINE ) 10 MG tablet Take 10 mg by mouth daily at 12 noon. Give 1 tablet by mouth in the afternoon related to Orthostatic HTN. Hold for SBP greater than and notify provider.     Multiple Vitamin (MULTIVITAMIN WITH MINERALS) TABS tablet Take 1 tablet by mouth daily.     nitroGLYCERIN  (NITROSTAT ) 0.4 MG SL tablet PLACE 1 TAB UNDER THE TONGUE EVERY 5 MINUTES AS NEEDED FOR CHEST PAIN (SEVERE PRESSURE OR TIGHTNESS) 25 tablet 3   pantoprazole  (PROTONIX ) 40 MG tablet TAKE 1 TABLET BY MOUTH EVERY DAY 90 tablet 3   polyethylene glycol (MIRALAX  / GLYCOLAX ) 17 g packet Take 17 g by mouth daily as needed for mild constipation. 14 each 0   [Paused] sertraline  (ZOLOFT ) 100 MG tablet Take 1 tablet (100 mg total) by mouth daily.     tirzepatide  (MOUNJARO ) 15 MG/0.5ML Pen Inject 15 mg into the skin once a week. 6 mL 3   traZODone (DESYREL) 50 MG tablet Take 50 mg by mouth at bedtime.     Vitamin D , Ergocalciferol , (DRISDOL ) 1.25 MG (50000 UNIT) CAPS capsule Take 1 capsule (50,000 Units total) by mouth every 7 (seven) days. 4 capsule 0   [Paused] amiodarone  (PACERONE ) 200 MG tablet Take 1 tablet (200 mg total) by  mouth 2 (two) times daily. (Patient not taking: Reported on 08/26/2023) 60 tablet 0   No current facility-administered medications for this visit.    Allergies:   Beta-lactamase inhibitors (beta-lactam), Other, Wellbutrin [bupropion], Metformin  and related, Sulfa antibiotics, and Tetracyclines & related    Social History:  The patient  reports that he quit smoking about 20 years ago. His smoking use included cigarettes. He started smoking about 62 years ago. He has a 42 pack-year smoking history. He has never used smokeless tobacco. He reports that he does not drink alcohol and does not use drugs.   Family History:  The patient's He was adopted. Family history is unknown by patient.    ROS:  Please see the history of present illness.   Otherwise, review of systems are positive for none.   All other systems are reviewed and negative.    PHYSICAL EXAM: VS:  BP (!) 60/40   Ht 6' 1 (1.854 m)   Wt 226 lb (102.5 kg)   BMI 29.82 kg/m  , BMI Body mass index is 29.82 kg/m. GEN: Well nourished, well developed, in no acute distress  HEENT: normal  Neck: no JVD, carotid bruits, or masses Cardiac: RRR; no murmurs, rubs, or gallops,no edema  Respiratory:  clear to auscultation bilaterally, normal work of breathing GI: soft, nontender, nondistended, + BS MS: no deformity or atrophy  Skin: warm and dry, no rash Neuro:  Strength and sensation are intact Psych: euthymic mood, full affect    EKG:  EKG is not ordered today.    Recent Labs: 04/17/2023: B Natriuretic Peptide 765.1 07/29/2023: TSH 8.948 07/30/2023: ALT 25 08/02/2023: Hemoglobin 10.6; Platelets 204 08/08/2023: BUN 11; Creatinine, Ser 1.93; Magnesium  1.9; Potassium 3.4; Sodium 139    Lipid  Panel    Component Value Date/Time   CHOL 84 04/02/2023 0456   CHOL 178 05/12/2018 1150   TRIG 161 (H) 04/02/2023 0456   HDL 24 (L) 04/02/2023 0456   HDL 42 05/12/2018 1150   CHOLHDL 3.5 04/02/2023 0456   VLDL 32 04/02/2023 0456    LDLCALC 28 04/02/2023 0456   LDLCALC 67 05/12/2018 1150      Wt Readings from Last 3 Encounters:  08/26/23 226 lb (102.5 kg)  08/19/23 226 lb (102.5 kg)  08/08/23 226 lb 10.1 oz (102.8 kg)           No data to display            ASSESSMENT AND PLAN:  1. Peripheral arterial disease: He is status post bilateral lower extremity endovascular intervention for critical limb ischemia.  Currently with no open wounds.  He had normal ABI recently.  Duplex showed significant stenosis of the left popliteal artery but currently with no symptoms.  Thus, I do not recommend angiography or revascularization at this time.  2. Coronary artery disease involving native coronary arteries without angina: Continue medical therapy.  3.  Severe orthostatic hypotension: He is severely hypotensive today.  I rechecked his blood pressure and it was 80/50.  I discussed evaluation in the ED but the patient declined.  Will discontinue losartan  altogether.  Continue midodrine .  4. Hyperlipidemia: Currently on rosuvastatin  and Zetia .  5.  Persistent atrial fibrillation: He seems to be in sinus rhythm.  Continue treatment with amiodarone  and anticoagulation with Eliquis .   Disposition:   Follow-up with me in 6 months.   Signed,  Deatrice Cage, MD  08/26/2023 4:52 PM    [facility]

## 2023-08-27 DIAGNOSIS — E876 Hypokalemia: Secondary | ICD-10-CM | POA: Diagnosis not present

## 2023-08-27 DIAGNOSIS — I739 Peripheral vascular disease, unspecified: Secondary | ICD-10-CM | POA: Diagnosis not present

## 2023-08-27 DIAGNOSIS — D649 Anemia, unspecified: Secondary | ICD-10-CM | POA: Diagnosis not present

## 2023-08-27 DIAGNOSIS — G4733 Obstructive sleep apnea (adult) (pediatric): Secondary | ICD-10-CM | POA: Diagnosis not present

## 2023-08-27 DIAGNOSIS — E559 Vitamin D deficiency, unspecified: Secondary | ICD-10-CM | POA: Diagnosis not present

## 2023-08-27 DIAGNOSIS — L97411 Non-pressure chronic ulcer of right heel and midfoot limited to breakdown of skin: Secondary | ICD-10-CM | POA: Diagnosis not present

## 2023-08-27 DIAGNOSIS — Z8709 Personal history of other diseases of the respiratory system: Secondary | ICD-10-CM | POA: Diagnosis not present

## 2023-08-27 DIAGNOSIS — I2510 Atherosclerotic heart disease of native coronary artery without angina pectoris: Secondary | ICD-10-CM | POA: Diagnosis not present

## 2023-08-27 DIAGNOSIS — L02415 Cutaneous abscess of right lower limb: Secondary | ICD-10-CM | POA: Diagnosis not present

## 2023-08-27 DIAGNOSIS — R42 Dizziness and giddiness: Secondary | ICD-10-CM | POA: Diagnosis not present

## 2023-08-27 DIAGNOSIS — E119 Type 2 diabetes mellitus without complications: Secondary | ICD-10-CM | POA: Diagnosis not present

## 2023-08-27 DIAGNOSIS — I4819 Other persistent atrial fibrillation: Secondary | ICD-10-CM | POA: Diagnosis not present

## 2023-08-27 DIAGNOSIS — E1140 Type 2 diabetes mellitus with diabetic neuropathy, unspecified: Secondary | ICD-10-CM | POA: Diagnosis not present

## 2023-08-28 DIAGNOSIS — Z8709 Personal history of other diseases of the respiratory system: Secondary | ICD-10-CM | POA: Diagnosis not present

## 2023-08-28 DIAGNOSIS — I2510 Atherosclerotic heart disease of native coronary artery without angina pectoris: Secondary | ICD-10-CM | POA: Diagnosis not present

## 2023-08-28 DIAGNOSIS — E876 Hypokalemia: Secondary | ICD-10-CM | POA: Diagnosis not present

## 2023-08-28 DIAGNOSIS — R42 Dizziness and giddiness: Secondary | ICD-10-CM | POA: Diagnosis not present

## 2023-08-28 DIAGNOSIS — E1140 Type 2 diabetes mellitus with diabetic neuropathy, unspecified: Secondary | ICD-10-CM | POA: Diagnosis not present

## 2023-08-28 DIAGNOSIS — E119 Type 2 diabetes mellitus without complications: Secondary | ICD-10-CM | POA: Diagnosis not present

## 2023-08-28 DIAGNOSIS — D649 Anemia, unspecified: Secondary | ICD-10-CM | POA: Diagnosis not present

## 2023-08-28 DIAGNOSIS — E559 Vitamin D deficiency, unspecified: Secondary | ICD-10-CM | POA: Diagnosis not present

## 2023-08-28 DIAGNOSIS — I4819 Other persistent atrial fibrillation: Secondary | ICD-10-CM | POA: Diagnosis not present

## 2023-08-28 DIAGNOSIS — I739 Peripheral vascular disease, unspecified: Secondary | ICD-10-CM | POA: Diagnosis not present

## 2023-08-29 DIAGNOSIS — E559 Vitamin D deficiency, unspecified: Secondary | ICD-10-CM | POA: Diagnosis not present

## 2023-08-29 DIAGNOSIS — I4819 Other persistent atrial fibrillation: Secondary | ICD-10-CM | POA: Diagnosis not present

## 2023-08-29 DIAGNOSIS — I2510 Atherosclerotic heart disease of native coronary artery without angina pectoris: Secondary | ICD-10-CM | POA: Diagnosis not present

## 2023-08-29 DIAGNOSIS — D649 Anemia, unspecified: Secondary | ICD-10-CM | POA: Diagnosis not present

## 2023-08-29 DIAGNOSIS — E119 Type 2 diabetes mellitus without complications: Secondary | ICD-10-CM | POA: Diagnosis not present

## 2023-08-29 DIAGNOSIS — I739 Peripheral vascular disease, unspecified: Secondary | ICD-10-CM | POA: Diagnosis not present

## 2023-08-29 DIAGNOSIS — E876 Hypokalemia: Secondary | ICD-10-CM | POA: Diagnosis not present

## 2023-08-29 DIAGNOSIS — R42 Dizziness and giddiness: Secondary | ICD-10-CM | POA: Diagnosis not present

## 2023-08-29 DIAGNOSIS — E1140 Type 2 diabetes mellitus with diabetic neuropathy, unspecified: Secondary | ICD-10-CM | POA: Diagnosis not present

## 2023-08-29 DIAGNOSIS — Z8709 Personal history of other diseases of the respiratory system: Secondary | ICD-10-CM | POA: Diagnosis not present

## 2023-09-01 DIAGNOSIS — E1140 Type 2 diabetes mellitus with diabetic neuropathy, unspecified: Secondary | ICD-10-CM | POA: Diagnosis not present

## 2023-09-01 DIAGNOSIS — E559 Vitamin D deficiency, unspecified: Secondary | ICD-10-CM | POA: Diagnosis not present

## 2023-09-01 DIAGNOSIS — I2510 Atherosclerotic heart disease of native coronary artery without angina pectoris: Secondary | ICD-10-CM | POA: Diagnosis not present

## 2023-09-01 DIAGNOSIS — I739 Peripheral vascular disease, unspecified: Secondary | ICD-10-CM | POA: Diagnosis not present

## 2023-09-01 DIAGNOSIS — R42 Dizziness and giddiness: Secondary | ICD-10-CM | POA: Diagnosis not present

## 2023-09-01 DIAGNOSIS — E876 Hypokalemia: Secondary | ICD-10-CM | POA: Diagnosis not present

## 2023-09-01 DIAGNOSIS — Z8709 Personal history of other diseases of the respiratory system: Secondary | ICD-10-CM | POA: Diagnosis not present

## 2023-09-01 DIAGNOSIS — I4819 Other persistent atrial fibrillation: Secondary | ICD-10-CM | POA: Diagnosis not present

## 2023-09-01 DIAGNOSIS — D649 Anemia, unspecified: Secondary | ICD-10-CM | POA: Diagnosis not present

## 2023-09-01 DIAGNOSIS — E119 Type 2 diabetes mellitus without complications: Secondary | ICD-10-CM | POA: Diagnosis not present

## 2023-09-02 DIAGNOSIS — I4819 Other persistent atrial fibrillation: Secondary | ICD-10-CM | POA: Diagnosis not present

## 2023-09-02 DIAGNOSIS — N184 Chronic kidney disease, stage 4 (severe): Secondary | ICD-10-CM | POA: Diagnosis not present

## 2023-09-02 DIAGNOSIS — L02415 Cutaneous abscess of right lower limb: Secondary | ICD-10-CM | POA: Diagnosis not present

## 2023-09-02 DIAGNOSIS — L97411 Non-pressure chronic ulcer of right heel and midfoot limited to breakdown of skin: Secondary | ICD-10-CM | POA: Diagnosis not present

## 2023-09-02 DIAGNOSIS — R42 Dizziness and giddiness: Secondary | ICD-10-CM | POA: Diagnosis not present

## 2023-09-02 DIAGNOSIS — Z8709 Personal history of other diseases of the respiratory system: Secondary | ICD-10-CM | POA: Diagnosis not present

## 2023-09-02 DIAGNOSIS — E1140 Type 2 diabetes mellitus with diabetic neuropathy, unspecified: Secondary | ICD-10-CM | POA: Diagnosis not present

## 2023-09-02 DIAGNOSIS — I70234 Atherosclerosis of native arteries of right leg with ulceration of heel and midfoot: Secondary | ICD-10-CM | POA: Diagnosis not present

## 2023-09-02 DIAGNOSIS — I2510 Atherosclerotic heart disease of native coronary artery without angina pectoris: Secondary | ICD-10-CM | POA: Diagnosis not present

## 2023-09-02 DIAGNOSIS — E1151 Type 2 diabetes mellitus with diabetic peripheral angiopathy without gangrene: Secondary | ICD-10-CM | POA: Diagnosis not present

## 2023-09-02 DIAGNOSIS — E559 Vitamin D deficiency, unspecified: Secondary | ICD-10-CM | POA: Diagnosis not present

## 2023-09-02 DIAGNOSIS — E876 Hypokalemia: Secondary | ICD-10-CM | POA: Diagnosis not present

## 2023-09-02 DIAGNOSIS — D649 Anemia, unspecified: Secondary | ICD-10-CM | POA: Diagnosis not present

## 2023-09-03 DIAGNOSIS — E559 Vitamin D deficiency, unspecified: Secondary | ICD-10-CM | POA: Diagnosis not present

## 2023-09-03 DIAGNOSIS — E1140 Type 2 diabetes mellitus with diabetic neuropathy, unspecified: Secondary | ICD-10-CM | POA: Diagnosis not present

## 2023-09-03 DIAGNOSIS — I2510 Atherosclerotic heart disease of native coronary artery without angina pectoris: Secondary | ICD-10-CM | POA: Diagnosis not present

## 2023-09-03 DIAGNOSIS — D649 Anemia, unspecified: Secondary | ICD-10-CM | POA: Diagnosis not present

## 2023-09-03 DIAGNOSIS — E1151 Type 2 diabetes mellitus with diabetic peripheral angiopathy without gangrene: Secondary | ICD-10-CM | POA: Diagnosis not present

## 2023-09-03 DIAGNOSIS — E876 Hypokalemia: Secondary | ICD-10-CM | POA: Diagnosis not present

## 2023-09-03 DIAGNOSIS — Z8709 Personal history of other diseases of the respiratory system: Secondary | ICD-10-CM | POA: Diagnosis not present

## 2023-09-03 DIAGNOSIS — R42 Dizziness and giddiness: Secondary | ICD-10-CM | POA: Diagnosis not present

## 2023-09-03 DIAGNOSIS — I4819 Other persistent atrial fibrillation: Secondary | ICD-10-CM | POA: Diagnosis not present

## 2023-09-04 ENCOUNTER — Ambulatory Visit: Admitting: Nurse Practitioner

## 2023-09-04 ENCOUNTER — Telehealth: Payer: Self-pay | Admitting: *Deleted

## 2023-09-04 DIAGNOSIS — R42 Dizziness and giddiness: Secondary | ICD-10-CM | POA: Diagnosis not present

## 2023-09-04 DIAGNOSIS — E1140 Type 2 diabetes mellitus with diabetic neuropathy, unspecified: Secondary | ICD-10-CM | POA: Diagnosis not present

## 2023-09-04 DIAGNOSIS — Z8709 Personal history of other diseases of the respiratory system: Secondary | ICD-10-CM | POA: Diagnosis not present

## 2023-09-04 DIAGNOSIS — I739 Peripheral vascular disease, unspecified: Secondary | ICD-10-CM | POA: Diagnosis not present

## 2023-09-04 DIAGNOSIS — E876 Hypokalemia: Secondary | ICD-10-CM | POA: Diagnosis not present

## 2023-09-04 DIAGNOSIS — I2510 Atherosclerotic heart disease of native coronary artery without angina pectoris: Secondary | ICD-10-CM | POA: Diagnosis not present

## 2023-09-04 DIAGNOSIS — D649 Anemia, unspecified: Secondary | ICD-10-CM | POA: Diagnosis not present

## 2023-09-04 DIAGNOSIS — I4819 Other persistent atrial fibrillation: Secondary | ICD-10-CM | POA: Diagnosis not present

## 2023-09-04 DIAGNOSIS — E559 Vitamin D deficiency, unspecified: Secondary | ICD-10-CM | POA: Diagnosis not present

## 2023-09-04 DIAGNOSIS — E119 Type 2 diabetes mellitus without complications: Secondary | ICD-10-CM | POA: Diagnosis not present

## 2023-09-04 NOTE — Telephone Encounter (Signed)
 Pt arrived, not knowing that his appointment had been canceled. Appears that it was canceled due to pt was seen on 08/26/23 and provider (Dr. Darron) did not believe pt would benefit from OV one week later.   Pt states he fainted this morning (is currently a resident at [facility]) and he woke up to staff fanning him.   Pt was advised to go to Emergency Room as nothing can be done here at an outpatient setting, due to syncopal episode. Apparently he has a history of severe orthostatic hypotension. Lasix  and Losartan  were d/c at the last OV on 08/26/2023. Pt did not seem eager to go to the Emergency Room and stated that he would notify the driver about the ER admission recommendation.   After discussing with Damien Maid, RN Supervisor, it was suggested that pt needed to sign AMA form. Unfortunately, pt had already left the lobby area when this RN and Hibbs, RN attempted to advise pt on ER admission vs. signing AMA form. Hibbs, RN has attempted to contact [facility] twice and left voice mails.

## 2023-09-05 DIAGNOSIS — R42 Dizziness and giddiness: Secondary | ICD-10-CM | POA: Diagnosis not present

## 2023-09-05 DIAGNOSIS — E1140 Type 2 diabetes mellitus with diabetic neuropathy, unspecified: Secondary | ICD-10-CM | POA: Diagnosis not present

## 2023-09-05 DIAGNOSIS — D649 Anemia, unspecified: Secondary | ICD-10-CM | POA: Diagnosis not present

## 2023-09-05 DIAGNOSIS — I4819 Other persistent atrial fibrillation: Secondary | ICD-10-CM | POA: Diagnosis not present

## 2023-09-05 DIAGNOSIS — E876 Hypokalemia: Secondary | ICD-10-CM | POA: Diagnosis not present

## 2023-09-05 DIAGNOSIS — Z8709 Personal history of other diseases of the respiratory system: Secondary | ICD-10-CM | POA: Diagnosis not present

## 2023-09-05 DIAGNOSIS — I739 Peripheral vascular disease, unspecified: Secondary | ICD-10-CM | POA: Diagnosis not present

## 2023-09-05 DIAGNOSIS — I2510 Atherosclerotic heart disease of native coronary artery without angina pectoris: Secondary | ICD-10-CM | POA: Diagnosis not present

## 2023-09-05 DIAGNOSIS — E559 Vitamin D deficiency, unspecified: Secondary | ICD-10-CM | POA: Diagnosis not present

## 2023-09-05 DIAGNOSIS — E119 Type 2 diabetes mellitus without complications: Secondary | ICD-10-CM | POA: Diagnosis not present

## 2023-09-05 NOTE — Telephone Encounter (Signed)
 Late entry from 09/04/23:  LMOVM for [facility] Administrator and DON. Direct number provided.

## 2023-09-05 NOTE — Telephone Encounter (Signed)
 AAS recurrent episodes of syncope and near syncope due to hypotension.  A lot of that has to do with low blood pressures from his other noncardiac issues.  This has become very difficult with dialysis as well.  He is not currently on any medications and is on high doses of midodrine .  I discussed his case with Dr. Darron at when he was seen last week.  He was not aware that he had been on an appointment to be seen.  He is very reluctant to go to the ER simply because every time he goes to the ER the same thing happens and he becomes quite frustrated. He is very much against going to the hospital.  He has had a lots of difficulty getting out of the hospital whenever he goes in because of the same issue.  [name], MD

## 2023-09-06 DIAGNOSIS — I4581 Long QT syndrome: Secondary | ICD-10-CM | POA: Diagnosis not present

## 2023-09-06 DIAGNOSIS — E1165 Type 2 diabetes mellitus with hyperglycemia: Secondary | ICD-10-CM | POA: Diagnosis not present

## 2023-09-06 DIAGNOSIS — K219 Gastro-esophageal reflux disease without esophagitis: Secondary | ICD-10-CM | POA: Diagnosis not present

## 2023-09-06 DIAGNOSIS — E1140 Type 2 diabetes mellitus with diabetic neuropathy, unspecified: Secondary | ICD-10-CM | POA: Diagnosis not present

## 2023-09-06 DIAGNOSIS — I10 Essential (primary) hypertension: Secondary | ICD-10-CM | POA: Diagnosis not present

## 2023-09-06 DIAGNOSIS — N1832 Chronic kidney disease, stage 3b: Secondary | ICD-10-CM | POA: Diagnosis not present

## 2023-09-06 DIAGNOSIS — L97411 Non-pressure chronic ulcer of right heel and midfoot limited to breakdown of skin: Secondary | ICD-10-CM | POA: Diagnosis not present

## 2023-09-06 DIAGNOSIS — M6281 Muscle weakness (generalized): Secondary | ICD-10-CM | POA: Diagnosis not present

## 2023-09-06 DIAGNOSIS — T84624D Infection and inflammatory reaction due to internal fixation device of right fibula, subsequent encounter: Secondary | ICD-10-CM | POA: Diagnosis not present

## 2023-09-06 DIAGNOSIS — I70234 Atherosclerosis of native arteries of right leg with ulceration of heel and midfoot: Secondary | ICD-10-CM | POA: Diagnosis not present

## 2023-09-06 DIAGNOSIS — M86171 Other acute osteomyelitis, right ankle and foot: Secondary | ICD-10-CM | POA: Diagnosis not present

## 2023-09-06 DIAGNOSIS — E43 Unspecified severe protein-calorie malnutrition: Secondary | ICD-10-CM | POA: Diagnosis not present

## 2023-09-08 ENCOUNTER — Ambulatory Visit

## 2023-09-08 DIAGNOSIS — E1140 Type 2 diabetes mellitus with diabetic neuropathy, unspecified: Secondary | ICD-10-CM | POA: Diagnosis not present

## 2023-09-08 DIAGNOSIS — E876 Hypokalemia: Secondary | ICD-10-CM | POA: Diagnosis not present

## 2023-09-08 DIAGNOSIS — R42 Dizziness and giddiness: Secondary | ICD-10-CM | POA: Diagnosis not present

## 2023-09-08 DIAGNOSIS — E559 Vitamin D deficiency, unspecified: Secondary | ICD-10-CM | POA: Diagnosis not present

## 2023-09-08 DIAGNOSIS — I739 Peripheral vascular disease, unspecified: Secondary | ICD-10-CM | POA: Diagnosis not present

## 2023-09-08 DIAGNOSIS — Z8709 Personal history of other diseases of the respiratory system: Secondary | ICD-10-CM | POA: Diagnosis not present

## 2023-09-08 DIAGNOSIS — I4819 Other persistent atrial fibrillation: Secondary | ICD-10-CM | POA: Diagnosis not present

## 2023-09-08 DIAGNOSIS — D649 Anemia, unspecified: Secondary | ICD-10-CM | POA: Diagnosis not present

## 2023-09-08 DIAGNOSIS — I2510 Atherosclerotic heart disease of native coronary artery without angina pectoris: Secondary | ICD-10-CM | POA: Diagnosis not present

## 2023-09-08 DIAGNOSIS — E119 Type 2 diabetes mellitus without complications: Secondary | ICD-10-CM | POA: Diagnosis not present

## 2023-09-08 NOTE — Progress Notes (Deleted)
 HISTORY AND PHYSICAL     CC:  follow up. Requesting Provider:  Caleen Dirks, MD  HPI: This is a 68 y.o. male who is here today for follow up for PAD.  Pt has hx of angiogram with stent of the right SFA on 04/01/2023 by Dr. Serene.   Pt is followed by cardiology for syncope and near syncope due to hypotension and has been difficulty to manage also with HD.  He is on midodrine .    The pt returns today for follow up.  ***  The pt is on a statin for cholesterol management.    The pt is not on an aspirin .    Other AC:  Eliquis /Plavix  The pt is not on medication for hypertension.  The pt is  on diabetic medication. Tobacco hx:  former  Pt does *** have family hx of AAA.  Past Medical History:  Diagnosis Date   Anemia    Anginal pain (HCC) 04/2013   Cardiac cath showed patent stents with distal LAD, circumflex-OM and RCA disease in small vessels.   Anxiety    Atrial fibrillation (HCC) 12/2021   CAD S/P percutaneous coronary angioplasty 2003, 04/2012   status post PCI to LAD, circumflex-OM 2, RCA   Carotid artery occlusion    Chronic renal insufficiency, stage II (mild)    Chronic venous insufficiency    varicosities, no reflux; dopplers 04/14/12- valvular insufficiency in the R and L GSV   Complication of anesthesia    COPD, mild (HCC)    Depression    situaltional    Diabetes mellitus    Diabetic neuropathy (HCC) 08/24/2019   Dyslipidemia associated with type 2 diabetes mellitus (HCC)    Fall at home, initial encounter 01/02/2023   GERD (gastroesophageal reflux disease)    History of cardioversion 12/2021   at [facility]   HTN (hypertension)    Hypothyroidism    Neuromuscular disorder (HCC)    neuropathy in feet   Neuropathy    notably improved following PCI with improved cardiac function   Obesity    Obesity, Class II, BMI 35.0-39.9, with comorbidity (see actual BMI)    BMI 39; wgt loss efforts in place; seeing Dietician   Open ankle fracture 01/02/2023   Orthostatic  hypotension    PONV (postoperative nausea and vomiting)    [place]   Pulmonary hypertension (HCC) 04/2012   PA pressure   Sleep apnea    uses nightly   Vitamin D  deficiency    per facility notes    Past Surgical History:  Procedure Laterality Date   ABDOMINAL AORTAGRAM N/A 11/15/2011   Procedure: ABDOMINAL EZELLA;  Surgeon: Carlin FORBES Haddock, MD;  Location: [facility] CATH LAB;  Service: Cardiovascular;  [name];   ABDOMINAL AORTOGRAM W/LOWER EXTREMITY N/A 10/13/2019   Procedure: ABDOMINAL AORTOGRAM W/LOWER EXTREMITY;  Surgeon: Darron Deatrice LABOR, MD;  Location: [facility] INVASIVE CV LAB;  Service: CV: Non-obst Ao-Iliac. R SFA-PopA patent with significant calcific stenosis of TP trunk-prox PTA (dominant) & CTO ATA  -> R PTA-TP PTCA   ABDOMINAL AORTOGRAM W/LOWER EXTREMITY N/A 11/06/2022   Procedure: ABDOMINAL AORTOGRAM W/LOWER EXTREMITY;  Surgeon: Darron Deatrice LABOR, MD;  Location: [facility]VAS[facility] CV LAB;  Service: Cardiovascular;  [name];   ABDOMINAL AORTOGRAM W/LOWER EXTREMITY Right 04/01/2023   Procedure: ABDOMINAL AORTOGRAM W/LOWER EXTREMITY;  Surgeon: Serene Gaile ORN, MD;  Location: [facility]VAS[facility] CV LAB;  Service: Cardiovascular;  Laterality: Right;   ABIs  04/27/2012   mild bilateral arterial insufficiency  ANKLE FUSION Right 05/15/2023   Procedure: ARTHRODESIS ANKLE WITH HINDFOOT FUSION NAIL;  Surgeon: Kendal Franky SQUIBB, MD;  [name];  Service: Orthopedics;  Laterality: Right;   APPLICATION OF WOUND VAC Right 03/31/2023   Procedure: APPLICATION OF WOUND VAC;  Surgeon: Kendal Franky SQUIBB, MD;  [name];  Service: Orthopedics;  Laterality: Right;   BACK SURGERY  2005 x1   X2-2010   BUNIONECTOMY Right 11/11/2013   Procedure: RIGHT FOOT SILVER BUNIONECTOMY;  Surgeon: Norleen Armor, MD;  Location: [facility];  Service: Orthopedics;  Laterality: Right;   CARDIAC CATHETERIZATION  12/11/2001   significant 3V CAD, normal LV function   CARDIOVERSION N/A  03/13/2022   Procedure: CARDIOVERSION;  Surgeon: Kate Lonni CROME, MD;  Location: [facility] ENDOSCOPY;  Service: Cardiovascular;  [name];   CARDIOVERSION N/A 07/25/2022   Procedure: CARDIOVERSION;  Surgeon: Jeffrie Oneil BROCKS, MD;  Location: [facility] CV LAB;  Service: Cardiovascular;  [name];   Carotid Doppler  04/27/2012   right internal carotid: Elevated velocities but no evidence of plaque. Left internal carotid 40-59%   CAROTID ENDARTERECTOMY  2005   Right; recent carotid Dopplers notes elevated velocities.    colonscopy     CORONARY ANGIOPLASTY  12/21/2001   PTCA of the distal and mid AV groove circ, unsuccessful PTCA of second OM total occlusion, unsuccessful PTCA of the apical LAD total occlusion   CORONARY ANGIOPLASTY WITH STENT PLACEMENT  04/29/2012   PCI to 3 RCA lesions, Promus Premiere 2.56mmx8mm distally, mid was 2.62mx28mm and proximally 2.75x21mm, EF 55-60%   CORONARY STENT PLACEMENT  04/28/2012   PCI to LAD (3x1mm Xience DES postdilated to 3.25) and circ prox and mid (2 overlappinmg 2.39mmx12mmXience DES postdilated to 2.38mm)   DOPPLER ECHOCARDIOGRAPHY  04/28/2012   poor quality study: EF estimated 60-65%; unable to assess diastolic function (previously noted to have diastolic dysfunction); severely dilated left atrium and mild right atrium; dilated IVC consistent with increased central venous pressure.SABRA   EXTERNAL FIXATION LEG Right 05/12/2023   Procedure: EXTERNAL FIXATION, LOWER EXTREMITY;  Surgeon: Kendal Franky SQUIBB, MD;  [name];  Service: Orthopedics;  Laterality: Right;   HARDWARE REMOVAL Right 03/31/2023   Procedure: HARDWARE REMOVAL RIGHT ANKLE;  Surgeon: Kendal Franky SQUIBB, MD;  [name];  Service: Orthopedics;  Laterality: Right;   HARDWARE REMOVAL Right 01/28/2023   Procedure: REMOVAL OF HARDWARE RIGHT ANKLE;  [name], MD;  [name];  Service: Orthopedics;  Laterality: Right;   HARDWARE REMOVAL Right 06/21/2023    Procedure: REMOVAL, HARDWARE;  Surgeon: Kendal Franky SQUIBB, MD;  [name];  Service: Orthopedics;  Laterality: Right;   HERNIA REPAIR     I & D EXTREMITY Right 01/02/2023   Procedure: IRRIGATION AND DEBRIDEMENT RIGHT ANKLE;  [name], MD;  [name];  Service: Orthopedics;  Laterality: Right;   I & D EXTREMITY Right 04/02/2023   Procedure: DEBRIDEMENT RIGHT ANKLE;  Surgeon: Harden Jerona GAILS, MD;  Location: [facility] OR;  Service: Orthopedics;  Laterality: Right;   IR FLUORO GUIDE CV LINE LEFT  04/22/2023   IR FLUORO GUIDE CV LINE RIGHT  04/07/2023   IR PATIENT EVAL TECH 0-60 MINS  04/07/2023   IR RADIOLOGIST EVAL & MGMT  04/08/2023   IR REMOVAL TUN CV CATH W/O FL  05/05/2023   IR US  GUIDE VASC ACCESS LEFT  04/22/2023   IR US  GUIDE VASC ACCESS RIGHT  04/07/2023   LEFT AND RIGHT HEART CATHETERIZATION  WITH CORONARY ANGIOGRAM N/A 04/27/2012   Procedure: LEFT AND RIGHT HEART CATHETERIZATION WITH CORONARY ANGIOGRAM;  [name], MD;  Location: [facility] CATH LAB;  Service: Cardiovascular;  [name];   LEFT AND RIGHT HEART CATHETERIZATION WITH CORONARY ANGIOGRAM N/A 05/14/2013   Procedure: LEFT AND RIGHT HEART CATHETERIZATION WITH CORONARY ANGIOGRAM;  [name], MD;  [name]; PCWP ;; [name], [name], Patent RCA ostial mid and distal stents - 70% distal runoff Dz   LUMBAR LAMINECTOMY/DECOMPRESSION MICRODISCECTOMY  01/07/2012   Procedure: LUMBAR LAMINECTOMY/DECOMPRESSION MICRODISCECTOMY 1 LEVEL;  [name], MD;  [name];  Service: Neurosurgery;  Laterality: Bilateral;   Lumbar Three-Four Decompression   LUMBAR LAMINECTOMY/DECOMPRESSION MICRODISCECTOMY N/A 07/26/2020   Procedure: Lumbar two-three Laminectomy/Foraminotomy;  [name], MD;  [name];  Service:  Neurosurgery;  [name];   ORIF ANKLE FRACTURE Right 01/02/2023   Procedure: OPEN REDUCTION INTERNAL FIXATION (ORIF)RIGHT ANKLE FRACTURE;  [name], MD;  [name];  Service: Orthopedics;  Laterality: Right;   PERCUTANEOUS CORONARY STENT INTERVENTION (PCI-S) N/A 04/28/2012   Procedure: PERCUTANEOUS CORONARY STENT INTERVENTION (PCI-S);  [name], MD;  Location: [facility] CATH LAB;  Service: Cardiovascular;  [name];   PERCUTANEOUS CORONARY STENT INTERVENTION (PCI-S) N/A 04/29/2012   Procedure: PERCUTANEOUS CORONARY STENT INTERVENTION (PCI-S);  [name], MD;  Location: [facility] CATH LAB;  Service: Cardiovascular;  [name];   PERIPHERAL VASCULAR ATHERECTOMY  11/06/2022   Procedure: PERIPHERAL VASCULAR ATHERECTOMY;  Surgeon: Darron Deatrice DELENA, MD;  [facility];  Service: Cardiovascular;;   PERIPHERAL VASCULAR BALLOON ANGIOPLASTY Right 10/13/2019   Procedure: PERIPHERAL VASCULAR BALLOON ANGIOPLASTY;  Surgeon: Darron Deatrice DELENA, MD;  [facility] LAB;  Service: Cardiovascular;  Laterality: Right;  PTCA of R TP Trunk into PTA (Drug-coated) => Follow-up ABIs 0.9 on the right and 0.99 on the left.   PERIPHERAL VASCULAR BALLOON ANGIOPLASTY Right 04/01/2023   Procedure: PERIPHERAL VASCULAR BALLOON ANGIOPLASTY;  Surgeon: Serene Gaile LELON, MD;  Location: [facility] CV LAB;  Service: Cardiovascular;  Laterality: Right;   PERIPHERAL VASCULAR INTERVENTION Right 04/01/2023   Procedure: PERIPHERAL VASCULAR INTERVENTION;  Surgeon: Serene Gaile LELON, MD;  Location: [facility] CV LAB;  Service: Cardiovascular;  Laterality: Right;   SPINE SURGERY     UMBILICAL HERNIA REPAIR  2009   steel mesh insert    Allergies  Allergen Reactions   Beta-Lactamase Inhibitors (Beta-Lactam) Hives, Itching and Rash    AX - penicillin     Ancef  given 9/24 with no obvious reaction   Other Nausea And Vomiting    General Anesthesia - sometimes  causes nausea and vomiting * OK to use scopolamine  patch     Wellbutrin [Bupropion] Other (See Comments)    Mood Changes   Metformin  And Related Diarrhea and Nausea Only   Sulfa Antibiotics Hives and Other (See Comments)    Bactrim   Tetracyclines & Related Hives and Rash    doxycycline     Current Outpatient Medications  Medication Sig Dispense Refill   acetaminophen  (TYLENOL ) 500 MG tablet Take 1 tablet (500 mg total) by mouth every 6 (six) hours as needed for mild pain (pain score 1-3).     allopurinol  (ZYLOPRIM ) 100 MG tablet Take 0.5 tablets (50 mg total) by mouth daily.  15 tablet 0   Amino Acids-Protein Hydrolys (PRO-STAT PO) Take 30 mLs by mouth 2 (two) times daily.     [Paused] amiodarone  (PACERONE ) 200 MG tablet Take 1 tablet (200 mg total) by mouth 2 (two) times daily. (Patient not taking: Reported on 08/26/2023) 60 tablet 0   apixaban  (ELIQUIS ) 5 MG TABS tablet TAKE 1 TABLET BY MOUTH TWICE A DAY 60 tablet 6   atorvastatin  (LIPITOR) 40 MG tablet Take 1 tablet (40 mg total) by mouth daily. 30 tablet 0   clopidogrel  (PLAVIX ) 75 MG tablet Take 75 mg by mouth every morning.     docusate sodium  (COLACE) 100 MG capsule Take 1 capsule (100 mg total) by mouth 2 (two) times daily. 10 capsule 0   DULoxetine  (CYMBALTA ) 20 MG capsule Take 1 capsule (20 mg total) by mouth daily.     ezetimibe  (ZETIA ) 10 MG tablet Take 10 mg by mouth every evening.     feeding supplement (ENSURE PLUS HIGH PROTEIN) LIQD Take 237 mLs by mouth 2 (two) times daily between meals.     ferrous sulfate  325 (65 FE) MG tablet Take 325 mg by mouth 3 (three) times a week. Monday, Wednesday, Friday     finasteride  (PROSCAR ) 5 MG tablet Take 5 mg by mouth daily.     gabapentin  (NEURONTIN ) 300 MG capsule Take 1 capsule (300 mg total) by mouth at bedtime. 30 capsule 0   insulin  glargine (LANTUS  SOLOSTAR) 100 UNIT/ML Solostar Pen Inject 5 Units into the skin at bedtime. 15 mL 11   insulin  lispro (HUMALOG) 100 UNIT/ML injection  Inject 0-15 Units into the skin in the morning, at noon, in the evening, and at bedtime. Per sliding scale: 0-70= 0 units if <70 implement hypoglycemic protocol & notify MD/NP 71-150= 0 units 151-200=3 units 201-250= 6 units 251-300= 9 units 301-350= 12 units 351-400= 15 units 401-999 if CBG>400. Notify MD/ NP,     levothyroxine  (SYNTHROID , LEVOTHROID) 88 MCG tablet Take 88 mcg by mouth daily before breakfast.     magnesium  oxide (MAG-OX) 400 MG tablet Take 1 tablet by mouth daily.     melatonin 3 MG TABS tablet Take 3 mg by mouth at bedtime.     methocarbamol  (ROBAXIN ) 500 MG tablet Take 1 tablet (500 mg total) by mouth every 6 (six) hours as needed for muscle spasms. 28 tablet 0   [Paused] midodrine  (PROAMATINE ) 10 MG tablet Take 10 mg by mouth daily at 12 noon. Give 1 tablet by mouth in the afternoon related to Orthostatic HTN. Hold for SBP greater than and notify provider.     Multiple Vitamin (MULTIVITAMIN WITH MINERALS) TABS tablet Take 1 tablet by mouth daily.     nitroGLYCERIN  (NITROSTAT ) 0.4 MG SL tablet PLACE 1 TAB UNDER THE TONGUE EVERY 5 MINUTES AS NEEDED FOR CHEST PAIN (SEVERE PRESSURE OR TIGHTNESS) 25 tablet 3   pantoprazole  (PROTONIX ) 40 MG tablet TAKE 1 TABLET BY MOUTH EVERY DAY 90 tablet 3   polyethylene glycol (MIRALAX  / GLYCOLAX ) 17 g packet Take 17 g by mouth daily as needed for mild constipation. 14 each 0   [Paused] sertraline  (ZOLOFT ) 100 MG tablet Take 1 tablet (100 mg total) by mouth daily.     tirzepatide  (MOUNJARO ) 15 MG/0.5ML Pen Inject 15 mg into the skin once a week. 6 mL 3   traZODone (DESYREL) 50 MG tablet Take 50 mg by mouth at bedtime.     Vitamin D , Ergocalciferol , (DRISDOL ) 1.25 MG (50000 UNIT) CAPS capsule Take 1  capsule (50,000 Units total) by mouth every 7 (seven) days. 4 capsule 0   No current facility-administered medications for this visit.    Family History  Adopted: Yes  Family history unknown: Yes    Social History   Socioeconomic  History   Marital status: Married    Spouse name: Not on file   Number of children: 0   Years of education: hs   Highest education level: Not on file  Occupational History   Occupation: Surveyor, quantity: replacement limited  Tobacco Use   Smoking status: Former    Current packs/day: 0.00    Average packs/day: 1 pack/day for 42.0 years (42.0 ttl pk-yrs)    Types: Cigarettes    Start date: 03/22/1961    Quit date: 03/23/2003    Years since quitting: 20.4   Smokeless tobacco: Never   Tobacco comments:    Former smoker 02/19/22  Vaping Use   Vaping status: Never Used  Substance and Sexual Activity   Alcohol use: No    Alcohol/week: 0.0 standard drinks of alcohol   Drug use: No   Sexual activity: Not on file  Other Topics Concern   Not on file  Social History Narrative   He and his partner Zachary have now officially gotten married on 05/26/2013.  Zachary is also a patient of our clinic.  He is now initially hyphenated his last name.   He has now  retiredfrom his long-term employment at [employer]. ->  As of September 2020   He does not smoke, he quit in 2009.  He does not drink alcohol.   He was adopted and no family history is known.    Social Drivers of Corporate investment banker Strain: Not on file  Food Insecurity: No Food Insecurity (07/31/2023)   Hunger Vital Sign    Worried About Running Out of Food in the Last Year: Never true    Ran Out of Food in the Last Year: Never true  Transportation Needs: No Transportation Needs (07/31/2023)   PRAPARE - Administrator, Civil Service (Medical): No    Lack of Transportation (Non-Medical): No  Physical Activity: Not on file  Stress: Not on file  Social Connections: Moderately Isolated (07/31/2023)   Social Connection and Isolation Panel    Frequency of Communication with Friends and Family: More than three times a week    Frequency of Social Gatherings with Friends and Family: Three times a  week    Attends Religious Services: Never    Active Member of Clubs or Organizations: No    Attends Banker Meetings: Never    Marital Status: Married  Catering manager Violence: Not At Risk (07/31/2023)   Humiliation, Afraid, Rape, and Kick questionnaire    Fear of Current or Ex-Partner: No    Emotionally Abused: No    Physically Abused: No    Sexually Abused: No     REVIEW OF SYSTEMS:  *** [X]  denotes positive finding, [ ]  denotes negative finding Cardiac  Comments:  Chest pain or chest pressure:    Shortness of breath upon exertion:    Short of breath when lying flat:    Irregular heart rhythm:        Vascular    Pain in calf, thigh, or hip brought on by ambulation:    Pain in feet at night that wakes you up from your sleep:     Blood clot in your veins:  Leg swelling:         Pulmonary    Oxygen at home:    Productive cough:     Wheezing:         Neurologic    Sudden weakness in arms or legs:     Sudden numbness in arms or legs:     Sudden onset of difficulty speaking or slurred speech:    Temporary loss of vision in one eye:     Problems with dizziness:         Gastrointestinal    Blood in stool:     Vomited blood:         Genitourinary    Burning when urinating:     Blood in urine:        Psychiatric    Major depression:         Hematologic    Bleeding problems:    Problems with blood clotting too easily:        Skin    Rashes or ulcers:        Constitutional    Fever or chills:      PHYSICAL EXAMINATION:  ***  General:  WDWN in NAD; vital signs documented above Gait: Not observed HENT: WNL, normocephalic Pulmonary: normal non-labored breathing , without wheezing Cardiac: {Desc; regular/irreg:14544} HR, {With/Without:20273} carotid bruit*** Abdomen: soft, NT; aortic pulse is *** palpable Skin: {With/Without:20273} rashes Vascular Exam/Pulses:  Right Left  Radial {Exam; arterial pulse strength 0-4:30167} {Exam; arterial  pulse strength 0-4:30167}  Femoral {Exam; arterial pulse strength 0-4:30167} {Exam; arterial pulse strength 0-4:30167}  Popliteal {Exam; arterial pulse strength 0-4:30167} {Exam; arterial pulse strength 0-4:30167}  DP {Exam; arterial pulse strength 0-4:30167} {Exam; arterial pulse strength 0-4:30167}  PT {Exam; arterial pulse strength 0-4:30167} {Exam; arterial pulse strength 0-4:30167}  Peroneal *** ***   Extremities: {With/Without:20273} ischemic changes, {With/Without:20273} Gangrene , {With/Without:20273} cellulitis; {With/Without:20273} open wounds Musculoskeletal: no muscle wasting or atrophy  Neurologic: A&O X 3 Psychiatric:  The pt has {Desc; normal/abnormal:11317::Normal} affect.   Non-Invasive Vascular Imaging:   ABI on 08/07/2023: Right:  1.18 Left:  1.10  Arterial duplex on 08/07/2023: +----------+--------+-----+--------+-----------+-------------+  RIGHT    PSV cm/sRatioStenosisWaveform   Comments       +----------+--------+-----+--------+-----------+-------------+  CFA Prox  99                   triphasic                 +----------+--------+-----+--------+-----------+-------------+  DFA      157                  biphasic                  +----------+--------+-----+--------+-----------+-------------+  SFA Prox  132                  triphasic                 +----------+--------+-----+--------+-----------+-------------+  SFA Mid   93                   triphasic                 +----------+--------+-----+--------+-----------+-------------+  SFA Distal60                   multiphasic               +----------+--------+-----+--------+-----------+-------------+  POP Prox  69  multiphasic               +----------+--------+-----+--------+-----------+-------------+  POP Distal123                  multiphasic               +----------+--------+-----+--------+-----------+-------------+  ATA Distal10                                              +----------+--------+-----+--------+-----------+-------------+  PTA Distal89                   multiphasic               +----------+--------+-----+--------+-----------+-------------+  DP       20                   monophasic reversed flow  +----------+--------+-----+--------+-----------+-------------+        +----------+--------+-----+---------------+-----------+--------+  LEFT     PSV cm/sRatioStenosis       Waveform   Comments  +----------+--------+-----+---------------+-----------+--------+  CFA Prox  97                          triphasic            +----------+--------+-----+---------------+-----------+--------+  DFA      150                         biphasic             +----------+--------+-----+---------------+-----------+--------+  SFA Prox  134                         multiphasic          +----------+--------+-----+---------------+-----------+--------+  SFA Mid   161                         multiphasic          +----------+--------+-----+---------------+-----------+--------+  SFA Distal102                         multiphasic          +----------+--------+-----+---------------+-----------+--------+  POP Prox  67                          multiphasic          +----------+--------+-----+---------------+-----------+--------+  POP Distal432          75-99% stenosis                     +----------+--------+-----+---------------+-----------+--------+  ATA Distal21                          monophasic           +----------+--------+-----+---------------+-----------+--------+  PTA Distal87                          monophasic           +----------+--------+-----+---------------+-----------+--------+  DP       14                          monophasic           +----------+--------+-----+---------------+-----------+--------+  Summary:  Right: There is  no hemodynamically significant stenosis seen in the right lower extremity.   Left: 75-99% stenosis noted in the popliteal artery. There appears to be  an increase in the left popliteal artery velocities when compared to the  prior exam.    ASSESSMENT/PLAN:: 68 y.o. male here for follow up for PAD with hx of angiogram with stent of the right SFA on 04/01/2023 by Dr. Serene.    -*** -continue *** -discussed importance of increased walking daily -pt will f/u in *** with ***.   Lucie Apt, [facility] [phone]  Clinic MD:   Gretta on call MD

## 2023-09-09 DIAGNOSIS — L02415 Cutaneous abscess of right lower limb: Secondary | ICD-10-CM | POA: Diagnosis not present

## 2023-09-09 DIAGNOSIS — G4733 Obstructive sleep apnea (adult) (pediatric): Secondary | ICD-10-CM | POA: Diagnosis not present

## 2023-09-09 DIAGNOSIS — L97411 Non-pressure chronic ulcer of right heel and midfoot limited to breakdown of skin: Secondary | ICD-10-CM | POA: Diagnosis not present

## 2023-09-09 DIAGNOSIS — E1140 Type 2 diabetes mellitus with diabetic neuropathy, unspecified: Secondary | ICD-10-CM | POA: Diagnosis not present

## 2023-09-10 DIAGNOSIS — R42 Dizziness and giddiness: Secondary | ICD-10-CM | POA: Diagnosis not present

## 2023-09-10 DIAGNOSIS — Z8709 Personal history of other diseases of the respiratory system: Secondary | ICD-10-CM | POA: Diagnosis not present

## 2023-09-10 DIAGNOSIS — I4819 Other persistent atrial fibrillation: Secondary | ICD-10-CM | POA: Diagnosis not present

## 2023-09-10 DIAGNOSIS — E119 Type 2 diabetes mellitus without complications: Secondary | ICD-10-CM | POA: Diagnosis not present

## 2023-09-10 DIAGNOSIS — D649 Anemia, unspecified: Secondary | ICD-10-CM | POA: Diagnosis not present

## 2023-09-10 DIAGNOSIS — M79671 Pain in right foot: Secondary | ICD-10-CM | POA: Diagnosis not present

## 2023-09-10 DIAGNOSIS — I2510 Atherosclerotic heart disease of native coronary artery without angina pectoris: Secondary | ICD-10-CM | POA: Diagnosis not present

## 2023-09-10 DIAGNOSIS — E1140 Type 2 diabetes mellitus with diabetic neuropathy, unspecified: Secondary | ICD-10-CM | POA: Diagnosis not present

## 2023-09-10 DIAGNOSIS — E876 Hypokalemia: Secondary | ICD-10-CM | POA: Diagnosis not present

## 2023-09-10 DIAGNOSIS — I739 Peripheral vascular disease, unspecified: Secondary | ICD-10-CM | POA: Diagnosis not present

## 2023-09-10 DIAGNOSIS — M25571 Pain in right ankle and joints of right foot: Secondary | ICD-10-CM | POA: Diagnosis not present

## 2023-09-10 DIAGNOSIS — E559 Vitamin D deficiency, unspecified: Secondary | ICD-10-CM | POA: Diagnosis not present

## 2023-09-10 NOTE — Progress Notes (Deleted)
 HISTORY AND PHYSICAL     CC:  follow up. Requesting Provider:  Caleen Dirks, MD  HPI: This is a 68 y.o. male who is here today for follow up for PAD.  Pt has hx of angiogram with stent of the right SFA on 04/01/2023 by Dr. Serene.   Pt is followed by cardiology for syncope and near syncope due to hypotension and has been difficulty to manage also with HD.  He is on midodrine .    The pt returns today for follow up.  ***  He is also followed for carotid artery stenosis.  He has a history of right carotid endarterectomy in 2005.  He denies any diagnosis of CVA or TIA since last office visit.  He also denies any current strokelike symptoms including slurring speech, changes in vision, or one-sided weakness.  The pt is on a statin for cholesterol management.    The pt is not on an aspirin .    Other AC:  Eliquis /Plavix  The pt is not on medication for hypertension.  The pt is  on diabetic medication. Tobacco hx:  former  Pt does *** have family hx of AAA.  Past Medical History:  Diagnosis Date   Anemia    Anginal pain (HCC) 04/2013   Cardiac cath showed patent stents with distal LAD, circumflex-OM and RCA disease in small vessels.   Anxiety    Atrial fibrillation (HCC) 12/2021   CAD S/P percutaneous coronary angioplasty 2003, 04/2012   status post PCI to LAD, circumflex-OM 2, RCA   Carotid artery occlusion    Chronic renal insufficiency, stage II (mild)    Chronic venous insufficiency    varicosities, no reflux; dopplers 04/14/12- valvular insufficiency in the R and L GSV   Complication of anesthesia    COPD, mild (HCC)    Depression    situaltional    Diabetes mellitus    Diabetic neuropathy (HCC) 08/24/2019   Dyslipidemia associated with type 2 diabetes mellitus (HCC)    Fall at home, initial encounter 01/02/2023   GERD (gastroesophageal reflux disease)    History of cardioversion 12/2021   at [facility]   HTN (hypertension)    Hypothyroidism    Neuromuscular disorder (HCC)     neuropathy in feet   Neuropathy    notably improved following PCI with improved cardiac function   Obesity    Obesity, Class II, BMI 35.0-39.9, with comorbidity (see actual BMI)    BMI 39; wgt loss efforts in place; seeing Dietician   Open ankle fracture 01/02/2023   Orthostatic hypotension    PONV (postoperative nausea and vomiting)    [place]   Pulmonary hypertension (HCC) 04/2012   PA pressure   Sleep apnea    uses nightly   Vitamin D  deficiency    per facility notes    Past Surgical History:  Procedure Laterality Date   ABDOMINAL AORTAGRAM N/A 11/15/2011   Procedure: ABDOMINAL EZELLA;  Surgeon: Carlin FORBES Haddock, MD;  Location: [facility] CATH LAB;  Service: Cardiovascular;  [name];   ABDOMINAL AORTOGRAM W/LOWER EXTREMITY N/A 10/13/2019   Procedure: ABDOMINAL AORTOGRAM W/LOWER EXTREMITY;  Surgeon: Darron Deatrice LABOR, MD;  Location: [facility] INVASIVE CV LAB;  Service: CV: Non-obst Ao-Iliac. R SFA-PopA patent with significant calcific stenosis of TP trunk-prox PTA (dominant) & CTO ATA  -> R PTA-TP PTCA   ABDOMINAL AORTOGRAM W/LOWER EXTREMITY N/A 11/06/2022   Procedure: ABDOMINAL AORTOGRAM W/LOWER EXTREMITY;  Surgeon: Darron Deatrice LABOR, MD;  Location: [facility] CV LAB;  Service: Cardiovascular;  [name];   ABDOMINAL AORTOGRAM W/LOWER EXTREMITY Right 04/01/2023   Procedure: ABDOMINAL AORTOGRAM W/LOWER EXTREMITY;  Surgeon: Serene Gaile ORN, MD;  Location: [facility]AS[facility] CV LAB;  Service: Cardiovascular;  Laterality: Right;   ABIs  04/27/2012   mild bilateral arterial insufficiency   ANKLE FUSION Right 05/15/2023   Procedure: ARTHRODESIS ANKLE WITH HINDFOOT FUSION NAIL;  Surgeon: Kendal Franky SQUIBB, MD;  [name];  Service: Orthopedics;  Laterality: Right;   APPLICATION OF WOUND VAC Right 03/31/2023   Procedure: APPLICATION OF WOUND VAC;  Surgeon: Kendal Franky SQUIBB, MD;  [name];  Service: Orthopedics;  Laterality: Right;   BACK SURGERY  2005 x1   X2-2010    BUNIONECTOMY Right 11/11/2013   Procedure: RIGHT FOOT SILVER BUNIONECTOMY;  Surgeon: Norleen Armor, MD;  Location: [facility];  Service: Orthopedics;  Laterality: Right;   CARDIAC CATHETERIZATION  12/11/2001   significant 3V CAD, normal LV function   CARDIOVERSION N/A 03/13/2022   Procedure: CARDIOVERSION;  Surgeon: Kate Lonni CROME, MD;  Location: [facility] ENDOSCOPY;  Service: Cardiovascular;  [name];   CARDIOVERSION N/A 07/25/2022   Procedure: CARDIOVERSION;  Surgeon: Jeffrie Oneil BROCKS, MD;  Location: [facility] CV LAB;  Service: Cardiovascular;  [name];   Carotid Doppler  04/27/2012   right internal carotid: Elevated velocities but no evidence of plaque. Left internal carotid 40-59%   CAROTID ENDARTERECTOMY  2005   Right; recent carotid Dopplers notes elevated velocities.    colonscopy     CORONARY ANGIOPLASTY  12/21/2001   PTCA of the distal and mid AV groove circ, unsuccessful PTCA of second OM total occlusion, unsuccessful PTCA of the apical LAD total occlusion   CORONARY ANGIOPLASTY WITH STENT PLACEMENT  04/29/2012   PCI to 3 RCA lesions, Promus Premiere 2.40mmx8mm distally, mid was 2.46mx28mm and proximally 2.75x64mm, EF 55-60%   CORONARY STENT PLACEMENT  04/28/2012   PCI to LAD (3x37mm Xience DES postdilated to 3.25) and circ prox and mid (2 overlappinmg 2.64mmx12mmXience DES postdilated to 2.62mm)   DOPPLER ECHOCARDIOGRAPHY  04/28/2012   poor quality study: EF estimated 60-65%; unable to assess diastolic function (previously noted to have diastolic dysfunction); severely dilated left atrium and mild right atrium; dilated IVC consistent with increased central venous pressure.SABRA   EXTERNAL FIXATION LEG Right 05/12/2023   Procedure: EXTERNAL FIXATION, LOWER EXTREMITY;  Surgeon: Kendal Franky SQUIBB, MD;  [name];  Service: Orthopedics;  Laterality: Right;   HARDWARE REMOVAL Right 03/31/2023   Procedure: HARDWARE REMOVAL RIGHT ANKLE;  Surgeon: Kendal Franky SQUIBB,  MD;  [name];  Service: Orthopedics;  Laterality: Right;   HARDWARE REMOVAL Right 01/28/2023   Procedure: REMOVAL OF HARDWARE RIGHT ANKLE;  [name], MD;  [name];  Service: Orthopedics;  Laterality: Right;   HARDWARE REMOVAL Right 06/21/2023   Procedure: REMOVAL, HARDWARE;  Surgeon: Kendal Franky SQUIBB, MD;  [name];  Service: Orthopedics;  Laterality: Right;   HERNIA REPAIR     I & D EXTREMITY Right 01/02/2023   Procedure: IRRIGATION AND DEBRIDEMENT RIGHT ANKLE;  [name], MD;  [name];  Service: Orthopedics;  Laterality: Right;   I & D EXTREMITY Right 04/02/2023   Procedure: DEBRIDEMENT RIGHT ANKLE;  Surgeon: Harden Jerona GAILS, MD;  Location: [facility] OR;  Service: Orthopedics;  Laterality: Right;   IR FLUORO GUIDE CV LINE LEFT  04/22/2023   IR FLUORO GUIDE CV LINE RIGHT  04/07/2023   IR PATIENT EVAL TECH  0-60 MINS  04/07/2023   IR RADIOLOGIST EVAL & MGMT  04/08/2023   IR REMOVAL TUN CV CATH W/O FL  05/05/2023   IR US  GUIDE VASC ACCESS LEFT  04/22/2023   IR US  GUIDE VASC ACCESS RIGHT  04/07/2023   LEFT AND RIGHT HEART CATHETERIZATION WITH CORONARY ANGIOGRAM N/A 04/27/2012   Procedure: LEFT AND RIGHT HEART CATHETERIZATION WITH CORONARY ANGIOGRAM;  [name], MD;  Location: [facility] CATH LAB;  Service: Cardiovascular;  [name];   LEFT AND RIGHT HEART CATHETERIZATION WITH CORONARY ANGIOGRAM N/A 05/14/2013   Procedure: LEFT AND RIGHT HEART CATHETERIZATION WITH CORONARY ANGIOGRAM;  [name], MD;  [name]; PCWP ;; [name], [name], Patent RCA ostial mid and distal stents - 70% distal runoff Dz   LUMBAR LAMINECTOMY/DECOMPRESSION MICRODISCECTOMY  01/07/2012   Procedure: LUMBAR LAMINECTOMY/DECOMPRESSION MICRODISCECTOMY 1 LEVEL;  [name], MD;  [facility] NEURO  [facility];  Service: Neurosurgery;  Laterality: Bilateral;   Lumbar Three-Four Decompression   LUMBAR LAMINECTOMY/DECOMPRESSION MICRODISCECTOMY N/A 07/26/2020   Procedure: Lumbar two-three Laminectomy/Foraminotomy;  [name], MD;  [name];  Service: Neurosurgery;  [name];   ORIF ANKLE FRACTURE Right 01/02/2023   Procedure: OPEN REDUCTION INTERNAL FIXATION (ORIF)RIGHT ANKLE FRACTURE;  [name], MD;  [name];  Service: Orthopedics;  Laterality: Right;   PERCUTANEOUS CORONARY STENT INTERVENTION (PCI-S) N/A 04/28/2012   Procedure: PERCUTANEOUS CORONARY STENT INTERVENTION (PCI-S);  [name], MD;  Location: [facility] CATH LAB;  Service: Cardiovascular;  [name];   PERCUTANEOUS CORONARY STENT INTERVENTION (PCI-S) N/A 04/29/2012   Procedure: PERCUTANEOUS CORONARY STENT INTERVENTION (PCI-S);  [name], MD;  Location: [facility] CATH LAB;  Service: Cardiovascular;  [name];   PERIPHERAL VASCULAR ATHERECTOMY  11/06/2022   Procedure: PERIPHERAL VASCULAR ATHERECTOMY;  Surgeon: Darron Deatrice DELENA, MD;  [facility];  Service: Cardiovascular;;   PERIPHERAL VASCULAR BALLOON ANGIOPLASTY Right 10/13/2019   Procedure: PERIPHERAL VASCULAR BALLOON ANGIOPLASTY;  Surgeon: Darron Deatrice DELENA, MD;  [facility];  Service: Cardiovascular;  Laterality: Right;  PTCA of R TP Trunk into PTA (Drug-coated) => Follow-up ABIs 0.9 on the right and 0.99 on the left.   PERIPHERAL VASCULAR BALLOON ANGIOPLASTY Right 04/01/2023   Procedure: PERIPHERAL VASCULAR BALLOON ANGIOPLASTY;  Surgeon: Serene Gaile LELON, MD;  [facility]: [facility];  Service: Cardiovascular;  Laterality: Right;   PERIPHERAL VASCULAR INTERVENTION Right 04/01/2023   Procedure: PERIPHERAL VASCULAR INTERVENTION;  Surgeon: Serene Gaile LELON, MD;  Location: [facility];  Service: Cardiovascular;  Laterality: Right;   SPINE SURGERY     UMBILICAL HERNIA  REPAIR  2009   steel mesh insert    Allergies  Allergen Reactions   Beta-Lactamase Inhibitors (Beta-Lactam) Hives, Itching and Rash    AX - penicillin     Ancef  given 9/24 with no obvious reaction   Other Nausea And Vomiting    General Anesthesia - sometimes causes nausea and vomiting * OK to use scopolamine  patch     Wellbutrin [Bupropion] Other (See Comments)    Mood Changes   Metformin  And Related Diarrhea and Nausea Only   Sulfa Antibiotics Hives and Other (See Comments)    Bactrim   Tetracyclines & Related Hives and Rash    doxycycline     Current Outpatient  Medications  Medication Sig Dispense Refill   acetaminophen  (TYLENOL ) 500 MG tablet Take 1 tablet (500 mg total) by mouth every 6 (six) hours as needed for mild pain (pain score 1-3).     allopurinol  (ZYLOPRIM ) 100 MG tablet Take 0.5 tablets (50 mg total) by mouth daily. 15 tablet 0   Amino Acids-Protein Hydrolys (PRO-STAT PO) Take 30 mLs by mouth 2 (two) times daily.     [Paused] amiodarone  (PACERONE ) 200 MG tablet Take 1 tablet (200 mg total) by mouth 2 (two) times daily. (Patient not taking: Reported on 08/26/2023) 60 tablet 0   apixaban  (ELIQUIS ) 5 MG TABS tablet TAKE 1 TABLET BY MOUTH TWICE A DAY 60 tablet 6   atorvastatin  (LIPITOR) 40 MG tablet Take 1 tablet (40 mg total) by mouth daily. 30 tablet 0   clopidogrel  (PLAVIX ) 75 MG tablet Take 75 mg by mouth every morning.     docusate sodium  (COLACE) 100 MG capsule Take 1 capsule (100 mg total) by mouth 2 (two) times daily. 10 capsule 0   DULoxetine  (CYMBALTA ) 20 MG capsule Take 1 capsule (20 mg total) by mouth daily.     ezetimibe  (ZETIA ) 10 MG tablet Take 10 mg by mouth every evening.     feeding supplement (ENSURE PLUS HIGH PROTEIN) LIQD Take 237 mLs by mouth 2 (two) times daily between meals.     ferrous sulfate  325 (65 FE) MG tablet Take 325 mg by mouth 3 (three) times a week. Monday, Wednesday, Friday     finasteride  (PROSCAR ) 5 MG tablet Take 5 mg by mouth daily.      gabapentin  (NEURONTIN ) 300 MG capsule Take 1 capsule (300 mg total) by mouth at bedtime. 30 capsule 0   insulin  glargine (LANTUS  SOLOSTAR) 100 UNIT/ML Solostar Pen Inject 5 Units into the skin at bedtime. 15 mL 11   insulin  lispro (HUMALOG) 100 UNIT/ML injection Inject 0-15 Units into the skin in the morning, at noon, in the evening, and at bedtime. Per sliding scale: 0-70= 0 units if <70 implement hypoglycemic protocol & notify MD/NP 71-150= 0 units 151-200=3 units 201-250= 6 units 251-300= 9 units 301-350= 12 units 351-400= 15 units 401-999 if CBG>400. Notify MD/ NP,     levothyroxine  (SYNTHROID , LEVOTHROID) 88 MCG tablet Take 88 mcg by mouth daily before breakfast.     magnesium  oxide (MAG-OX) 400 MG tablet Take 1 tablet by mouth daily.     melatonin 3 MG TABS tablet Take 3 mg by mouth at bedtime.     methocarbamol  (ROBAXIN ) 500 MG tablet Take 1 tablet (500 mg total) by mouth every 6 (six) hours as needed for muscle spasms. 28 tablet 0   [Paused] midodrine  (PROAMATINE ) 10 MG tablet Take 10 mg by mouth daily at 12 noon. Give 1 tablet by mouth in the afternoon related to Orthostatic HTN. Hold for SBP greater than and notify provider.     Multiple Vitamin (MULTIVITAMIN WITH MINERALS) TABS tablet Take 1 tablet by mouth daily.     nitroGLYCERIN  (NITROSTAT ) 0.4 MG SL tablet PLACE 1 TAB UNDER THE TONGUE EVERY 5 MINUTES AS NEEDED FOR CHEST PAIN (SEVERE PRESSURE OR TIGHTNESS) 25 tablet 3   pantoprazole  (PROTONIX ) 40 MG tablet TAKE 1 TABLET BY MOUTH EVERY DAY 90 tablet 3   polyethylene glycol (MIRALAX  / GLYCOLAX ) 17 g packet Take 17 g by mouth daily as needed for mild constipation. 14 each 0   [Paused] sertraline  (ZOLOFT ) 100 MG tablet Take 1 tablet (100 mg total) by mouth daily.  tirzepatide  (MOUNJARO ) 15 MG/0.5ML Pen Inject 15 mg into the skin once a week. 6 mL 3   traZODone (DESYREL) 50 MG tablet Take 50 mg by mouth at bedtime.     Vitamin D , Ergocalciferol , (DRISDOL ) 1.25 MG (50000  UNIT) CAPS capsule Take 1 capsule (50,000 Units total) by mouth every 7 (seven) days. 4 capsule 0   No current facility-administered medications for this visit.    Family History  Adopted: Yes  Family history unknown: Yes    Social History   Socioeconomic History   Marital status: Married    Spouse name: Not on file   Number of children: 0   Years of education: hs   Highest education level: Not on file  Occupational History   Occupation: Surveyor, quantity: replacement limited  Tobacco Use   Smoking status: Former    Current packs/day: 0.00    Average packs/day: 1 pack/day for 42.0 years (42.0 ttl pk-yrs)    Types: Cigarettes    Start date: 03/22/1961    Quit date: 03/23/2003    Years since quitting: 20.4   Smokeless tobacco: Never   Tobacco comments:    Former smoker 02/19/22  Vaping Use   Vaping status: Never Used  Substance and Sexual Activity   Alcohol use: No    Alcohol/week: 0.0 standard drinks of alcohol   Drug use: No   Sexual activity: Not on file  Other Topics Concern   Not on file  Social History Narrative   He and his partner Zachary have now officially gotten married on 05/26/2013.  Zachary is also a patient of our clinic.  He is now initially hyphenated his last name.   He has now  retiredfrom his long-term employment at [employer]. ->  As of September 2020   He does not smoke, he quit in 2009.  He does not drink alcohol.   He was adopted and no family history is known.    Social Drivers of Corporate investment banker Strain: Not on file  Food Insecurity: No Food Insecurity (07/31/2023)   Hunger Vital Sign    Worried About Running Out of Food in the Last Year: Never true    Ran Out of Food in the Last Year: Never true  Transportation Needs: No Transportation Needs (07/31/2023)   PRAPARE - Administrator, Civil Service (Medical): No    Lack of Transportation (Non-Medical): No  Physical Activity: Not on file  Stress:  Not on file  Social Connections: Moderately Isolated (07/31/2023)   Social Connection and Isolation Panel    Frequency of Communication with Friends and Family: More than three times a week    Frequency of Social Gatherings with Friends and Family: Three times a week    Attends Religious Services: Never    Active Member of Clubs or Organizations: No    Attends Banker Meetings: Never    Marital Status: Married  Catering manager Violence: Not At Risk (07/31/2023)   Humiliation, Afraid, Rape, and Kick questionnaire    Fear of Current or Ex-Partner: No    Emotionally Abused: No    Physically Abused: No    Sexually Abused: No     REVIEW OF SYSTEMS:  *** [X]  denotes positive finding, [ ]  denotes negative finding Cardiac  Comments:  Chest pain or chest pressure:    Shortness of breath upon exertion:    Short of breath when lying flat:    Irregular heart rhythm:  Vascular    Pain in calf, thigh, or hip brought on by ambulation:    Pain in feet at night that wakes you up from your sleep:     Blood clot in your veins:    Leg swelling:         Pulmonary    Oxygen at home:    Productive cough:     Wheezing:         Neurologic    Sudden weakness in arms or legs:     Sudden numbness in arms or legs:     Sudden onset of difficulty speaking or slurred speech:    Temporary loss of vision in one eye:     Problems with dizziness:         Gastrointestinal    Blood in stool:     Vomited blood:         Genitourinary    Burning when urinating:     Blood in urine:        Psychiatric    Major depression:         Hematologic    Bleeding problems:    Problems with blood clotting too easily:        Skin    Rashes or ulcers:        Constitutional    Fever or chills:      PHYSICAL EXAMINATION:  ***  General:  WDWN in NAD; vital signs documented above Gait: Not observed HENT: WNL, normocephalic Pulmonary: normal non-labored breathing , without  wheezing Cardiac: {Desc; regular/irreg:14544} HR, {With/Without:20273} carotid bruit*** Abdomen: soft, NT; aortic pulse is *** palpable Skin: {With/Without:20273} rashes Vascular Exam/Pulses:  Right Left  Radial {Exam; arterial pulse strength 0-4:30167} {Exam; arterial pulse strength 0-4:30167}  Femoral {Exam; arterial pulse strength 0-4:30167} {Exam; arterial pulse strength 0-4:30167}  Popliteal {Exam; arterial pulse strength 0-4:30167} {Exam; arterial pulse strength 0-4:30167}  DP {Exam; arterial pulse strength 0-4:30167} {Exam; arterial pulse strength 0-4:30167}  PT {Exam; arterial pulse strength 0-4:30167} {Exam; arterial pulse strength 0-4:30167}  Peroneal *** ***   Extremities: {With/Without:20273} ischemic changes, {With/Without:20273} Gangrene , {With/Without:20273} cellulitis; {With/Without:20273} open wounds Musculoskeletal: no muscle wasting or atrophy  Neurologic: A&O X 3 Psychiatric:  The pt has {Desc; normal/abnormal:11317::Normal} affect.   Non-Invasive Vascular Imaging:   ABI on 08/07/2023: Right:  1.18 Left:  1.10  Arterial duplex on 08/07/2023: +----------+--------+-----+--------+-----------+-------------+  RIGHT    PSV cm/sRatioStenosisWaveform   Comments       +----------+--------+-----+--------+-----------+-------------+  CFA Prox  99                   triphasic                 +----------+--------+-----+--------+-----------+-------------+  DFA      157                  biphasic                  +----------+--------+-----+--------+-----------+-------------+  SFA Prox  132                  triphasic                 +----------+--------+-----+--------+-----------+-------------+  SFA Mid   93                   triphasic                 +----------+--------+-----+--------+-----------+-------------+  SFA Distal60  multiphasic               +----------+--------+-----+--------+-----------+-------------+   POP Prox  69                   multiphasic               +----------+--------+-----+--------+-----------+-------------+  POP Distal123                  multiphasic               +----------+--------+-----+--------+-----------+-------------+  ATA Distal10                                             +----------+--------+-----+--------+-----------+-------------+  PTA Distal89                   multiphasic               +----------+--------+-----+--------+-----------+-------------+  DP       20                   monophasic reversed flow  +----------+--------+-----+--------+-----------+-------------+        +----------+--------+-----+---------------+-----------+--------+  LEFT     PSV cm/sRatioStenosis       Waveform   Comments  +----------+--------+-----+---------------+-----------+--------+  CFA Prox  97                          triphasic            +----------+--------+-----+---------------+-----------+--------+  DFA      150                         biphasic             +----------+--------+-----+---------------+-----------+--------+  SFA Prox  134                         multiphasic          +----------+--------+-----+---------------+-----------+--------+  SFA Mid   161                         multiphasic          +----------+--------+-----+---------------+-----------+--------+  SFA Distal102                         multiphasic          +----------+--------+-----+---------------+-----------+--------+  POP Prox  67                          multiphasic          +----------+--------+-----+---------------+-----------+--------+  POP Distal432          75-99% stenosis                     +----------+--------+-----+---------------+-----------+--------+  ATA Distal21                          monophasic           +----------+--------+-----+---------------+-----------+--------+  PTA Distal87                           monophasic           +----------+--------+-----+---------------+-----------+--------+  DP       14                          monophasic           +----------+--------+-----+---------------+-----------+--------+   Summary:  Right: There is no hemodynamically significant stenosis seen in the right lower extremity.   Left: 75-99% stenosis noted in the popliteal artery. There appears to be  an increase in the left popliteal artery velocities when compared to the  prior exam.    ASSESSMENT/PLAN:: 68 y.o. male here for follow up for PAD with hx of angiogram with stent of the right SFA on 04/01/2023 by Dr. Serene.    -*** -continue *** -discussed importance of increased walking daily -pt will f/u in *** with ***.  -We will also need to check his carotid duplex.  Unfortunately this was not performed prior to today's visit.  We will check this in the near future and notify him of the results.  Donnice Sender, [facility] [phone]  Clinic MD:   Gretta on call MD

## 2023-09-11 DIAGNOSIS — E1140 Type 2 diabetes mellitus with diabetic neuropathy, unspecified: Secondary | ICD-10-CM | POA: Diagnosis not present

## 2023-09-11 DIAGNOSIS — Z8709 Personal history of other diseases of the respiratory system: Secondary | ICD-10-CM | POA: Diagnosis not present

## 2023-09-11 DIAGNOSIS — I4819 Other persistent atrial fibrillation: Secondary | ICD-10-CM | POA: Diagnosis not present

## 2023-09-11 DIAGNOSIS — I739 Peripheral vascular disease, unspecified: Secondary | ICD-10-CM | POA: Diagnosis not present

## 2023-09-11 DIAGNOSIS — E876 Hypokalemia: Secondary | ICD-10-CM | POA: Diagnosis not present

## 2023-09-11 DIAGNOSIS — R42 Dizziness and giddiness: Secondary | ICD-10-CM | POA: Diagnosis not present

## 2023-09-11 DIAGNOSIS — D649 Anemia, unspecified: Secondary | ICD-10-CM | POA: Diagnosis not present

## 2023-09-11 DIAGNOSIS — I2510 Atherosclerotic heart disease of native coronary artery without angina pectoris: Secondary | ICD-10-CM | POA: Diagnosis not present

## 2023-09-11 DIAGNOSIS — E559 Vitamin D deficiency, unspecified: Secondary | ICD-10-CM | POA: Diagnosis not present

## 2023-09-11 DIAGNOSIS — E119 Type 2 diabetes mellitus without complications: Secondary | ICD-10-CM | POA: Diagnosis not present

## 2023-09-12 DIAGNOSIS — E119 Type 2 diabetes mellitus without complications: Secondary | ICD-10-CM | POA: Diagnosis not present

## 2023-09-12 DIAGNOSIS — E1140 Type 2 diabetes mellitus with diabetic neuropathy, unspecified: Secondary | ICD-10-CM | POA: Diagnosis not present

## 2023-09-12 DIAGNOSIS — I2510 Atherosclerotic heart disease of native coronary artery without angina pectoris: Secondary | ICD-10-CM | POA: Diagnosis not present

## 2023-09-12 DIAGNOSIS — E559 Vitamin D deficiency, unspecified: Secondary | ICD-10-CM | POA: Diagnosis not present

## 2023-09-12 DIAGNOSIS — D649 Anemia, unspecified: Secondary | ICD-10-CM | POA: Diagnosis not present

## 2023-09-12 DIAGNOSIS — R42 Dizziness and giddiness: Secondary | ICD-10-CM | POA: Diagnosis not present

## 2023-09-12 DIAGNOSIS — I4819 Other persistent atrial fibrillation: Secondary | ICD-10-CM | POA: Diagnosis not present

## 2023-09-12 DIAGNOSIS — I739 Peripheral vascular disease, unspecified: Secondary | ICD-10-CM | POA: Diagnosis not present

## 2023-09-12 DIAGNOSIS — Z8709 Personal history of other diseases of the respiratory system: Secondary | ICD-10-CM | POA: Diagnosis not present

## 2023-09-12 DIAGNOSIS — E876 Hypokalemia: Secondary | ICD-10-CM | POA: Diagnosis not present

## 2023-09-15 ENCOUNTER — Ambulatory Visit

## 2023-09-15 DIAGNOSIS — E559 Vitamin D deficiency, unspecified: Secondary | ICD-10-CM | POA: Diagnosis not present

## 2023-09-15 DIAGNOSIS — I739 Peripheral vascular disease, unspecified: Secondary | ICD-10-CM | POA: Diagnosis not present

## 2023-09-15 DIAGNOSIS — I2510 Atherosclerotic heart disease of native coronary artery without angina pectoris: Secondary | ICD-10-CM | POA: Diagnosis not present

## 2023-09-15 DIAGNOSIS — E119 Type 2 diabetes mellitus without complications: Secondary | ICD-10-CM | POA: Diagnosis not present

## 2023-09-15 DIAGNOSIS — I4819 Other persistent atrial fibrillation: Secondary | ICD-10-CM | POA: Diagnosis not present

## 2023-09-15 DIAGNOSIS — R42 Dizziness and giddiness: Secondary | ICD-10-CM | POA: Diagnosis not present

## 2023-09-15 DIAGNOSIS — E1140 Type 2 diabetes mellitus with diabetic neuropathy, unspecified: Secondary | ICD-10-CM | POA: Diagnosis not present

## 2023-09-15 DIAGNOSIS — E876 Hypokalemia: Secondary | ICD-10-CM | POA: Diagnosis not present

## 2023-09-15 DIAGNOSIS — Z8709 Personal history of other diseases of the respiratory system: Secondary | ICD-10-CM | POA: Diagnosis not present

## 2023-09-15 DIAGNOSIS — D649 Anemia, unspecified: Secondary | ICD-10-CM | POA: Diagnosis not present

## 2023-09-16 ENCOUNTER — Inpatient Hospital Stay (HOSPITAL_COMMUNITY)
Admission: EM | Admit: 2023-09-16 | Discharge: 2023-09-22 | DRG: 493 | Disposition: A | Discharge status: Skilled Nursing Facility | Attending: Infectious Diseases | Admitting: Infectious Diseases

## 2023-09-16 ENCOUNTER — Emergency Department (HOSPITAL_COMMUNITY)

## 2023-09-16 ENCOUNTER — Inpatient Hospital Stay (HOSPITAL_COMMUNITY)

## 2023-09-16 ENCOUNTER — Other Ambulatory Visit: Payer: Self-pay

## 2023-09-16 DIAGNOSIS — Z880 Allergy status to penicillin: Secondary | ICD-10-CM

## 2023-09-16 DIAGNOSIS — N400 Enlarged prostate without lower urinary tract symptoms: Secondary | ICD-10-CM | POA: Diagnosis present

## 2023-09-16 DIAGNOSIS — G629 Polyneuropathy, unspecified: Secondary | ICD-10-CM

## 2023-09-16 DIAGNOSIS — I959 Hypotension, unspecified: Secondary | ICD-10-CM | POA: Diagnosis not present

## 2023-09-16 DIAGNOSIS — Z743 Need for continuous supervision: Secondary | ICD-10-CM | POA: Diagnosis not present

## 2023-09-16 DIAGNOSIS — N1831 Chronic kidney disease, stage 3a: Secondary | ICD-10-CM | POA: Diagnosis not present

## 2023-09-16 DIAGNOSIS — M009 Pyogenic arthritis, unspecified: Principal | ICD-10-CM | POA: Diagnosis present

## 2023-09-16 DIAGNOSIS — N1832 Chronic kidney disease, stage 3b: Secondary | ICD-10-CM

## 2023-09-16 DIAGNOSIS — E1151 Type 2 diabetes mellitus with diabetic peripheral angiopathy without gangrene: Secondary | ICD-10-CM

## 2023-09-16 DIAGNOSIS — I4819 Other persistent atrial fibrillation: Secondary | ICD-10-CM | POA: Diagnosis not present

## 2023-09-16 DIAGNOSIS — M86471 Chronic osteomyelitis with draining sinus, right ankle and foot: Secondary | ICD-10-CM

## 2023-09-16 DIAGNOSIS — Z87891 Personal history of nicotine dependence: Secondary | ICD-10-CM

## 2023-09-16 DIAGNOSIS — R42 Dizziness and giddiness: Secondary | ICD-10-CM | POA: Diagnosis not present

## 2023-09-16 DIAGNOSIS — E785 Hyperlipidemia, unspecified: Secondary | ICD-10-CM | POA: Diagnosis not present

## 2023-09-16 DIAGNOSIS — L97411 Non-pressure chronic ulcer of right heel and midfoot limited to breakdown of skin: Secondary | ICD-10-CM | POA: Diagnosis not present

## 2023-09-16 DIAGNOSIS — M25579 Pain in unspecified ankle and joints of unspecified foot: Secondary | ICD-10-CM | POA: Diagnosis not present

## 2023-09-16 DIAGNOSIS — M86171 Other acute osteomyelitis, right ankle and foot: Secondary | ICD-10-CM | POA: Diagnosis not present

## 2023-09-16 DIAGNOSIS — K219 Gastro-esophageal reflux disease without esophagitis: Secondary | ICD-10-CM | POA: Diagnosis not present

## 2023-09-16 DIAGNOSIS — R600 Localized edema: Secondary | ICD-10-CM | POA: Diagnosis not present

## 2023-09-16 DIAGNOSIS — M00871 Arthritis due to other bacteria, right ankle and foot: Secondary | ICD-10-CM | POA: Diagnosis not present

## 2023-09-16 DIAGNOSIS — Z794 Long term (current) use of insulin: Secondary | ICD-10-CM

## 2023-09-16 DIAGNOSIS — R58 Hemorrhage, not elsewhere classified: Secondary | ICD-10-CM | POA: Diagnosis not present

## 2023-09-16 DIAGNOSIS — Z951 Presence of aortocoronary bypass graft: Secondary | ICD-10-CM | POA: Diagnosis not present

## 2023-09-16 DIAGNOSIS — I130 Hypertensive heart and chronic kidney disease with heart failure and stage 1 through stage 4 chronic kidney disease, or unspecified chronic kidney disease: Secondary | ICD-10-CM | POA: Diagnosis not present

## 2023-09-16 DIAGNOSIS — E1169 Type 2 diabetes mellitus with other specified complication: Secondary | ICD-10-CM | POA: Diagnosis not present

## 2023-09-16 DIAGNOSIS — D649 Anemia, unspecified: Secondary | ICD-10-CM | POA: Diagnosis not present

## 2023-09-16 DIAGNOSIS — M86671 Other chronic osteomyelitis, right ankle and foot: Secondary | ICD-10-CM | POA: Diagnosis not present

## 2023-09-16 DIAGNOSIS — J449 Chronic obstructive pulmonary disease, unspecified: Secondary | ICD-10-CM | POA: Diagnosis not present

## 2023-09-16 DIAGNOSIS — L02415 Cutaneous abscess of right lower limb: Secondary | ICD-10-CM | POA: Diagnosis not present

## 2023-09-16 DIAGNOSIS — D631 Anemia in chronic kidney disease: Secondary | ICD-10-CM | POA: Diagnosis present

## 2023-09-16 DIAGNOSIS — I5032 Chronic diastolic (congestive) heart failure: Secondary | ICD-10-CM | POA: Diagnosis not present

## 2023-09-16 DIAGNOSIS — I272 Pulmonary hypertension, unspecified: Secondary | ICD-10-CM | POA: Diagnosis present

## 2023-09-16 DIAGNOSIS — Z881 Allergy status to other antibiotic agents status: Secondary | ICD-10-CM

## 2023-09-16 DIAGNOSIS — T84418A Breakdown (mechanical) of other internal orthopedic devices, implants and grafts, initial encounter: Secondary | ICD-10-CM | POA: Diagnosis not present

## 2023-09-16 DIAGNOSIS — I70234 Atherosclerosis of native arteries of right leg with ulceration of heel and midfoot: Secondary | ICD-10-CM | POA: Diagnosis not present

## 2023-09-16 DIAGNOSIS — Z8679 Personal history of other diseases of the circulatory system: Secondary | ICD-10-CM | POA: Diagnosis not present

## 2023-09-16 DIAGNOSIS — E039 Hypothyroidism, unspecified: Secondary | ICD-10-CM | POA: Diagnosis present

## 2023-09-16 DIAGNOSIS — I129 Hypertensive chronic kidney disease with stage 1 through stage 4 chronic kidney disease, or unspecified chronic kidney disease: Secondary | ICD-10-CM

## 2023-09-16 DIAGNOSIS — M869 Osteomyelitis, unspecified: Secondary | ICD-10-CM | POA: Diagnosis not present

## 2023-09-16 DIAGNOSIS — Z7985 Long-term (current) use of injectable non-insulin antidiabetic drugs: Secondary | ICD-10-CM

## 2023-09-16 DIAGNOSIS — S99919A Unspecified injury of unspecified ankle, initial encounter: Secondary | ICD-10-CM | POA: Diagnosis not present

## 2023-09-16 DIAGNOSIS — S8251XA Displaced fracture of medial malleolus of right tibia, initial encounter for closed fracture: Secondary | ICD-10-CM | POA: Diagnosis not present

## 2023-09-16 DIAGNOSIS — G4733 Obstructive sleep apnea (adult) (pediatric): Secondary | ICD-10-CM | POA: Diagnosis present

## 2023-09-16 DIAGNOSIS — I739 Peripheral vascular disease, unspecified: Secondary | ICD-10-CM | POA: Diagnosis not present

## 2023-09-16 DIAGNOSIS — E1149 Type 2 diabetes mellitus with other diabetic neurological complication: Secondary | ICD-10-CM | POA: Diagnosis not present

## 2023-09-16 DIAGNOSIS — I10 Essential (primary) hypertension: Secondary | ICD-10-CM | POA: Diagnosis not present

## 2023-09-16 DIAGNOSIS — R9431 Abnormal electrocardiogram [ECG] [EKG]: Secondary | ICD-10-CM

## 2023-09-16 DIAGNOSIS — Z7901 Long term (current) use of anticoagulants: Secondary | ICD-10-CM | POA: Diagnosis not present

## 2023-09-16 DIAGNOSIS — E1122 Type 2 diabetes mellitus with diabetic chronic kidney disease: Secondary | ICD-10-CM

## 2023-09-16 DIAGNOSIS — E1142 Type 2 diabetes mellitus with diabetic polyneuropathy: Secondary | ICD-10-CM | POA: Diagnosis present

## 2023-09-16 DIAGNOSIS — M65971 Unspecified synovitis and tenosynovitis, right ankle and foot: Secondary | ICD-10-CM | POA: Diagnosis not present

## 2023-09-16 DIAGNOSIS — Z79899 Other long term (current) drug therapy: Secondary | ICD-10-CM

## 2023-09-16 DIAGNOSIS — R609 Edema, unspecified: Secondary | ICD-10-CM | POA: Diagnosis not present

## 2023-09-16 DIAGNOSIS — L089 Local infection of the skin and subcutaneous tissue, unspecified: Secondary | ICD-10-CM | POA: Diagnosis not present

## 2023-09-16 DIAGNOSIS — E1140 Type 2 diabetes mellitus with diabetic neuropathy, unspecified: Secondary | ICD-10-CM | POA: Diagnosis not present

## 2023-09-16 DIAGNOSIS — F32A Depression, unspecified: Secondary | ICD-10-CM | POA: Diagnosis present

## 2023-09-16 DIAGNOSIS — S93401A Sprain of unspecified ligament of right ankle, initial encounter: Secondary | ICD-10-CM | POA: Diagnosis not present

## 2023-09-16 DIAGNOSIS — Z79890 Hormone replacement therapy (postmenopausal): Secondary | ICD-10-CM

## 2023-09-16 DIAGNOSIS — R262 Difficulty in walking, not elsewhere classified: Secondary | ICD-10-CM | POA: Diagnosis not present

## 2023-09-16 DIAGNOSIS — E119 Type 2 diabetes mellitus without complications: Secondary | ICD-10-CM | POA: Diagnosis not present

## 2023-09-16 DIAGNOSIS — S82891D Other fracture of right lower leg, subsequent encounter for closed fracture with routine healing: Secondary | ICD-10-CM | POA: Diagnosis not present

## 2023-09-16 DIAGNOSIS — I480 Paroxysmal atrial fibrillation: Secondary | ICD-10-CM

## 2023-09-16 DIAGNOSIS — L03115 Cellulitis of right lower limb: Secondary | ICD-10-CM | POA: Diagnosis present

## 2023-09-16 DIAGNOSIS — T8459XA Infection and inflammatory reaction due to other internal joint prosthesis, initial encounter: Secondary | ICD-10-CM | POA: Diagnosis not present

## 2023-09-16 DIAGNOSIS — E1165 Type 2 diabetes mellitus with hyperglycemia: Secondary | ICD-10-CM | POA: Diagnosis not present

## 2023-09-16 DIAGNOSIS — E43 Unspecified severe protein-calorie malnutrition: Secondary | ICD-10-CM | POA: Diagnosis not present

## 2023-09-16 DIAGNOSIS — Z888 Allergy status to other drugs, medicaments and biological substances status: Secondary | ICD-10-CM

## 2023-09-16 DIAGNOSIS — Z7401 Bed confinement status: Secondary | ICD-10-CM | POA: Diagnosis not present

## 2023-09-16 DIAGNOSIS — T84624D Infection and inflammatory reaction due to internal fixation device of right fibula, subsequent encounter: Secondary | ICD-10-CM | POA: Diagnosis not present

## 2023-09-16 DIAGNOSIS — I4581 Long QT syndrome: Secondary | ICD-10-CM | POA: Diagnosis not present

## 2023-09-16 DIAGNOSIS — Z8709 Personal history of other diseases of the respiratory system: Secondary | ICD-10-CM | POA: Diagnosis not present

## 2023-09-16 DIAGNOSIS — Z981 Arthrodesis status: Secondary | ICD-10-CM

## 2023-09-16 DIAGNOSIS — M19071 Primary osteoarthritis, right ankle and foot: Secondary | ICD-10-CM | POA: Diagnosis not present

## 2023-09-16 DIAGNOSIS — S82851E Displaced trimalleolar fracture of right lower leg, subsequent encounter for open fracture type I or II with routine healing: Secondary | ICD-10-CM | POA: Diagnosis not present

## 2023-09-16 DIAGNOSIS — R531 Weakness: Secondary | ICD-10-CM | POA: Diagnosis not present

## 2023-09-16 DIAGNOSIS — I2510 Atherosclerotic heart disease of native coronary artery without angina pectoris: Secondary | ICD-10-CM

## 2023-09-16 DIAGNOSIS — S82401A Unspecified fracture of shaft of right fibula, initial encounter for closed fracture: Secondary | ICD-10-CM | POA: Diagnosis not present

## 2023-09-16 DIAGNOSIS — M6281 Muscle weakness (generalized): Secondary | ICD-10-CM | POA: Diagnosis not present

## 2023-09-16 DIAGNOSIS — E871 Hypo-osmolality and hyponatremia: Secondary | ICD-10-CM | POA: Diagnosis not present

## 2023-09-16 DIAGNOSIS — E559 Vitamin D deficiency, unspecified: Secondary | ICD-10-CM | POA: Diagnosis present

## 2023-09-16 DIAGNOSIS — Z882 Allergy status to sulfonamides status: Secondary | ICD-10-CM

## 2023-09-16 DIAGNOSIS — E876 Hypokalemia: Secondary | ICD-10-CM | POA: Diagnosis not present

## 2023-09-16 DIAGNOSIS — T84629S Infection and inflammatory reaction due to internal fixation device of unspecified bone of leg, sequela: Secondary | ICD-10-CM | POA: Diagnosis not present

## 2023-09-16 DIAGNOSIS — Z955 Presence of coronary angioplasty implant and graft: Secondary | ICD-10-CM

## 2023-09-16 DIAGNOSIS — M799 Soft tissue disorder, unspecified: Secondary | ICD-10-CM | POA: Diagnosis not present

## 2023-09-16 LAB — COMPREHENSIVE METABOLIC PANEL WITH GFR
ALT: 11 U/L (ref 0–44)
AST: 14 U/L — ABNORMAL LOW (ref 15–41)
Albumin: 1.8 g/dL — ABNORMAL LOW (ref 3.5–5.0)
Alkaline Phosphatase: 91 U/L (ref 38–126)
Anion gap: 9 (ref 5–15)
BUN: 19 mg/dL (ref 8–23)
CO2: 22 mmol/L (ref 22–32)
Calcium: 8.6 mg/dL — ABNORMAL LOW (ref 8.9–10.3)
Chloride: 103 mmol/L (ref 98–111)
Creatinine, Ser: 1.53 mg/dL — ABNORMAL HIGH (ref 0.61–1.24)
GFR, Estimated: 49 mL/min — ABNORMAL LOW (ref >60)
Glucose, Bld: 235 mg/dL — ABNORMAL HIGH (ref 70–99)
Potassium: 4.2 mmol/L (ref 3.5–5.1)
Sodium: 134 mmol/L — ABNORMAL LOW (ref 135–145)
Total Bilirubin: 0.4 mg/dL (ref 0.0–1.2)
Total Protein: 5.3 g/dL — ABNORMAL LOW (ref 6.5–8.1)

## 2023-09-16 LAB — CBC WITH DIFFERENTIAL/PLATELET
Abs Immature Granulocytes: 0.04 K/uL (ref 0.00–0.07)
Basophils Absolute: 0.1 K/uL (ref 0.0–0.1)
Basophils Relative: 1 %
Eosinophils Absolute: 0.2 K/uL (ref 0.0–0.5)
Eosinophils Relative: 3 %
HCT: 25 % — ABNORMAL LOW (ref 39.0–52.0)
Hemoglobin: 8.1 g/dL — ABNORMAL LOW (ref 13.0–17.0)
Immature Granulocytes: 1 %
Lymphocytes Relative: 19 %
Lymphs Abs: 1.4 K/uL (ref 0.7–4.0)
MCH: 29.3 pg (ref 26.0–34.0)
MCHC: 32.4 g/dL (ref 30.0–36.0)
MCV: 90.6 fL (ref 80.0–100.0)
Monocytes Absolute: 0.4 K/uL (ref 0.1–1.0)
Monocytes Relative: 6 %
Neutro Abs: 5.3 K/uL (ref 1.7–7.7)
Neutrophils Relative %: 70 %
Platelets: 323 K/uL (ref 150–400)
RBC: 2.76 MIL/uL — ABNORMAL LOW (ref 4.22–5.81)
RDW: 15.5 % (ref 11.5–15.5)
WBC: 7.6 K/uL (ref 4.0–10.5)
nRBC: 0 % (ref 0.0–0.2)

## 2023-09-16 LAB — GLUCOSE, CAPILLARY: Glucose-Capillary: 202 mg/dL — ABNORMAL HIGH (ref 70–99)

## 2023-09-16 LAB — PROTIME-INR
INR: 1.5 — ABNORMAL HIGH (ref 0.8–1.2)
Prothrombin Time: 18.7 s — ABNORMAL HIGH (ref 11.4–15.2)

## 2023-09-16 LAB — I-STAT CG4 LACTIC ACID, ED: Lactic Acid, Venous: 0.9 mmol/L (ref 0.5–1.9)

## 2023-09-16 MED ORDER — DOCUSATE SODIUM 100 MG PO CAPS
100.0000 mg | ORAL_CAPSULE | Freq: Two times a day (BID) | ORAL | Status: DC
  Administered 2023-09-16 – 2023-09-22 (×5): 100 mg via ORAL
  Filled 2023-09-16 (×11): qty 1

## 2023-09-16 MED ORDER — ENOXAPARIN SODIUM 40 MG/0.4ML IJ SOSY: 40.0000 mg | PREFILLED_SYRINGE | INTRAMUSCULAR | Status: DC

## 2023-09-16 MED ORDER — SODIUM CHLORIDE 0.9 % IV SOLN
2.0000 g | Freq: Once | INTRAVENOUS | Status: AC
  Administered 2023-09-16: 2 g via INTRAVENOUS
  Filled 2023-09-16: qty 20

## 2023-09-16 MED ORDER — ACETAMINOPHEN 650 MG RE SUPP: 650.0000 mg | Freq: Four times a day (QID) | RECTAL | Status: DC | PRN

## 2023-09-16 MED ORDER — METRONIDAZOLE 500 MG/100ML IV SOLN
500.0000 mg | Freq: Once | INTRAVENOUS | Status: AC
  Administered 2023-09-16: 500 mg via INTRAVENOUS
  Filled 2023-09-16: qty 100

## 2023-09-16 MED ORDER — POLYETHYLENE GLYCOL 3350 17 G PO PACK: 17.0000 g | PACK | Freq: Every day | ORAL | Status: DC | PRN

## 2023-09-16 MED ORDER — INSULIN ASPART 100 UNIT/ML IJ SOLN
0.0000 [IU] | Freq: Every day | INTRAMUSCULAR | Status: DC
  Administered 2023-09-16: 2 [IU] via SUBCUTANEOUS

## 2023-09-16 MED ORDER — GABAPENTIN 300 MG PO CAPS
300.0000 mg | ORAL_CAPSULE | Freq: Every day | ORAL | Status: DC
  Administered 2023-09-16 – 2023-09-21 (×6): 300 mg via ORAL
  Filled 2023-09-16 (×6): qty 1

## 2023-09-16 MED ORDER — OXYCODONE HCL 5 MG PO TABS
5.0000 mg | ORAL_TABLET | Freq: Once | ORAL | Status: AC
  Administered 2023-09-16: 5 mg via ORAL
  Filled 2023-09-16: qty 1

## 2023-09-16 MED ORDER — TRAZODONE HCL 50 MG PO TABS
50.0000 mg | ORAL_TABLET | Freq: Every day | ORAL | Status: DC
  Administered 2023-09-16 – 2023-09-21 (×6): 50 mg via ORAL
  Filled 2023-09-16 (×6): qty 1

## 2023-09-16 MED ORDER — LINEZOLID 600 MG/300ML IV SOLN
600.0000 mg | Freq: Two times a day (BID) | INTRAVENOUS | Status: DC
  Administered 2023-09-16: 600 mg via INTRAVENOUS
  Filled 2023-09-16 (×2): qty 300

## 2023-09-16 MED ORDER — ACETAMINOPHEN 325 MG PO TABS
650.0000 mg | ORAL_TABLET | Freq: Four times a day (QID) | ORAL | Status: DC | PRN
Start: 2023-09-16 — End: 2023-09-17
  Administered 2023-09-16: 650 mg via ORAL
  Filled 2023-09-16: qty 2

## 2023-09-16 MED ORDER — GADOBUTROL 1 MMOL/ML IV SOLN
10.0000 mL | Freq: Once | INTRAVENOUS | Status: AC | PRN
  Administered 2023-09-16: 10 mL via INTRAVENOUS

## 2023-09-16 MED ORDER — VANCOMYCIN HCL IN DEXTROSE 1-5 GM/200ML-% IV SOLN: 1000.0000 mg | Freq: Once | INTRAVENOUS | Status: DC

## 2023-09-16 MED ORDER — DULOXETINE HCL 20 MG PO CPEP
20.0000 mg | ORAL_CAPSULE | Freq: Every day | ORAL | Status: DC
  Administered 2023-09-17 – 2023-09-21 (×5): 20 mg via ORAL
  Filled 2023-09-16 (×6): qty 1

## 2023-09-16 MED ORDER — EZETIMIBE 10 MG PO TABS
10.0000 mg | ORAL_TABLET | Freq: Every evening | ORAL | Status: DC
Start: 2023-09-16 — End: 2023-09-22
  Administered 2023-09-16 – 2023-09-21 (×6): 10 mg via ORAL
  Filled 2023-09-16 (×6): qty 1

## 2023-09-16 MED ORDER — LEVOTHYROXINE SODIUM 88 MCG PO TABS
88.0000 ug | ORAL_TABLET | Freq: Every day | ORAL | Status: DC
  Administered 2023-09-17 – 2023-09-22 (×6): 88 ug via ORAL
  Filled 2023-09-16 (×7): qty 1

## 2023-09-16 MED ORDER — METRONIDAZOLE 500 MG/100ML IV SOLN: 500.0000 mg | Freq: Two times a day (BID) | INTRAVENOUS | Status: DC

## 2023-09-16 MED ORDER — HYDROMORPHONE HCL 1 MG/ML IJ SOLN
0.5000 mg | Freq: Once | INTRAMUSCULAR | Status: AC
  Administered 2023-09-16: 0.5 mg via INTRAVENOUS
  Filled 2023-09-16: qty 0.5

## 2023-09-16 MED ORDER — HYDROMORPHONE HCL 1 MG/ML IJ SOLN
0.5000 mg | Freq: Once | INTRAMUSCULAR | Status: AC
  Administered 2023-09-16: 0.5 mg via INTRAVENOUS
  Filled 2023-09-16: qty 1

## 2023-09-16 MED ORDER — INSULIN GLARGINE-YFGN 100 UNIT/ML ~~LOC~~ SOLN
5.0000 [IU] | Freq: Every day | SUBCUTANEOUS | Status: DC
  Administered 2023-09-16: 5 [IU] via SUBCUTANEOUS
  Filled 2023-09-16 (×2): qty 0.05

## 2023-09-16 MED ORDER — FINASTERIDE 5 MG PO TABS
5.0000 mg | ORAL_TABLET | Freq: Every day | ORAL | Status: DC
  Administered 2023-09-17 – 2023-09-22 (×6): 5 mg via ORAL
  Filled 2023-09-16 (×6): qty 1

## 2023-09-16 MED ORDER — ATORVASTATIN CALCIUM 40 MG PO TABS
40.0000 mg | ORAL_TABLET | Freq: Every day | ORAL | Status: DC
Start: 2023-09-17 — End: 2023-09-22
  Administered 2023-09-17 – 2023-09-22 (×6): 40 mg via ORAL
  Filled 2023-09-16 (×6): qty 1

## 2023-09-16 MED ORDER — VANCOMYCIN HCL 2000 MG/400ML IV SOLN
2000.0000 mg | Freq: Once | INTRAVENOUS | Status: DC
  Filled 2023-09-16: qty 400

## 2023-09-16 MED ORDER — PANTOPRAZOLE SODIUM 40 MG PO TBEC
40.0000 mg | DELAYED_RELEASE_TABLET | Freq: Every day | ORAL | Status: DC
  Administered 2023-09-16 – 2023-09-22 (×6): 40 mg via ORAL
  Filled 2023-09-16 (×7): qty 1

## 2023-09-16 MED ORDER — INSULIN ASPART 100 UNIT/ML IJ SOLN
0.0000 [IU] | Freq: Three times a day (TID) | INTRAMUSCULAR | Status: DC
  Administered 2023-09-17 (×2): 3 [IU] via SUBCUTANEOUS
  Administered 2023-09-17: 5 [IU] via SUBCUTANEOUS
  Administered 2023-09-18: 3 [IU] via SUBCUTANEOUS
  Administered 2023-09-18: 2 [IU] via SUBCUTANEOUS
  Administered 2023-09-18: 3 [IU] via SUBCUTANEOUS
  Administered 2023-09-19 (×2): 2 [IU] via SUBCUTANEOUS
  Administered 2023-09-20: 3 [IU] via SUBCUTANEOUS
  Administered 2023-09-20 – 2023-09-22 (×6): 2 [IU] via SUBCUTANEOUS

## 2023-09-16 MED ORDER — SODIUM CHLORIDE 0.9 % IV SOLN: 2.0000 g | INTRAVENOUS | Status: DC

## 2023-09-16 NOTE — ED Triage Notes (Signed)
 Patient from [facility] via [facility] for wounds on right ankle. Patient had surgery with pin and rod placement in november, some taken out in feb due to infection. Patient endorses multiple sores on right ankle, drainage ongoing for two weeks with progression of sores. Diabetic CBG 240 with neuropathy. Patient endorses increased swelling to foot today. Patient endorses taking eliquis  and plavix . Patient endorses no injury but unable to walk on foot due to pain.   EMS vitals BP 130 palp HR 55 O2 100

## 2023-09-16 NOTE — Hospital Course (Addendum)
 Harold [name] is a 68 y.o. 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 Hardware removed by Dr. Kendal 06/21/2023. Hospital discharge on 07/22/23 with chronic septic joint completed ertapenem  and daptomycin  on 07/31/2023. On admission, Right digits are swollen, weeping skins lesions needing repeated dressing changes. MRI on 7/29: Suspicious for OM in distal tibia, talus, and calcaneus. Suspected septic tibiotalar, distal tibiofibular, and subtalar joints. IV Linezolid  and change to cefepime  (7/30) until 7/31. Switched to IV dapto and ertapenem  on 7/31. On 7/31 CK 19, ESR 103, CRP 7.6. WBC normal and patient afebrile since 8/1.  Ortho surg and ID spoke with patient. Patient and partner are only okay with irrigation and debridement, not hardware removal. Dr. Ellan performed the I&D on 8/1 with patient understanding this was not the optimal procedure and may require long term/life long abx. Wound cultures grew enterobacter cloacae and staph capitis, susceptibilities available.  Held anticoagulation/platelet pending ortho surg, resumed apixaban  and clopidogrel  on 8/2.  Plan for patient to discharge with PICC line, placed 8/1, for IV Dapto and ertapenem  until 10/31/23. Outpatient f/u with ID and ortho. Blood cultures taken 7/29 finalized no growth at 5 days. Gabapentin , tylenol , oxycodone , and hydromorphone  orders in for pain control.    #Peripheral Vascular Disease Follows with Dr. Darron who in Aug  2021 did successful balloon angioplasty to tibioperoneal trunk in posterior tibial artery with drug-coated angioplasty. In sept 2024, patient had reccurent wounds on left leg which were healed after Dr. Darron did successful orbital atherectomy and drug-coated balloon angioplasty to left popliteal artery, TP trunk and posterior tibial artery. After fall in Nov 2024, Dr. Serene did Angiogram which showed calcified stenosis in distal right SFA, treated with Eluvia drug-eluting stent. OT and PT saw him in the hospital. Clopidogrel  resumed 8/2.    #Qtc prolongation #Paroxysmal atrial fibrillation  -Afib diagnosed in 2023, refractory to [facility] on 03/13/22 and 03/27/22, Dr. Nancey transitioned to medical therapy starting amio Hospitalization in June 2025, patient had Qtc prolongation (likely due to polypharmacy with prolong drugs) treated with amiodarone  with recommendations to avoid further Qtc prolong medications. Continued home amiodarone  200 mg twice daily. Resumed apixaban  5 mg twice daily after surgery.    #Diabetes Mellitus Type 2 In the hospital, goal sugars <180 for wound healing optimization. Sliding scale: glargine 8 units daily (increased from 5 units on 7/30), aspart 0-15. Goal was met while in hospital.    #Chronic Kidney Disease Stage 3b Patient denies symptoms concerning for infection while in the hospital, has been urinating normally. Has had dialysis in the past but not currently.    #Coronary artery disease s/p CABG Continued home Lipitor 40 mg in the hospital. Resumed eliquis  and plavix  after surgery.    #Chronic anemia On admission hgb was 8.1, was at 7.3 on 8/1 before surgery. May transfuse if there was blood loss with surgery. Remained above 7 while in hospital, 7.3 on discharge.    #Chronic HTN Carvedilol  discontinued in July due to concern for orthostatic hypotension. Patient was hypertensive throughout hospitalization despite home losartan  25 mg.  Patients pain level has been high  which may correlate.     #OSA on CPAP On most nights, patient opted to use his CPAP machine while in the hospital.    #GAD #Depression Continue duloxetine  20 mg and trazodone  50 mg   #BPH Continue finasteride  5 mg   #GERD Continue pantoprazole  40 mg tablet   #COPD   #Hypothyroidism Continue home levothyroxine  88 mcg

## 2023-09-16 NOTE — Progress Notes (Signed)
   09/16/23 2302  BiPAP/CPAP/SIPAP  Reason BIPAP/CPAP not in use Non-compliant (Pt refused)   Pt states he does not need CPAP here because he can elevate the bed. Education provided. Pt refused.

## 2023-09-16 NOTE — H&P (Incomplete)
 Internal Medicine Teaching Service Attending Note Date: 09/16/2023  Patient name: Harold [name]  Medical record number: [id number]  Date of birth: 10-18-1955   I have seen and evaluated Arley MARLA Orleans and discussed their care with the Residency Team.   68 yo M with hx of DM2 since 1996, neuropahty, CKD3b, R ankle prosthesis post fall 12-2022. His course has been complicated by multiple bouts of presumed infection in his prosthesis. He had Cx 03-2023 MRSE and E cloacae. He was last on anbx 07-2023. He has previously seen Dr Bianca and Dr Overton.  He has been staying at [facility] an has noted wounds on his RLE beginning around 80-15. These started as blood blisters (which he has had extensively on his RLE), these wounds have worsened since.  He has also had difficulty with weight bearing during this period to the point he has been completely non-wt bearing.   All- bactrim- rash, PEN- rash, Dapto ? Aki.   ROS- no f/c, no proximal erythema, wt loss of 106#.  Physical Exam: Blood pressure 131/62, pulse 61, temperature 98.7 F (37.1 C), temperature source Oral, resp. rate 14, height 6' 1 (1.854 m), weight 102.5 kg, SpO2 100%. General appearance: alert, cooperative, and no distress Eyes: negative findings: pupils equal, round, reactive to light and accomodation Throat: normal findings: oropharynx pink & moist without lesions or evidence of thrush Neck: no JVD and supple, symmetrical, trachea midline Resp: clear to auscultation bilaterally Cardio: regular rate and rhythm GI: normal findings: bowel sounds normal and soft, non-tender Extremities: edema trace and RLE wrapped at ankle. There is a posterior blister, on the medial ankle there is a dime sized wound which appears exophytic. This is tender and no d/c is expressed.   Lab results: Results for orders placed or performed during the hospital encounter of 09/16/23 (from the past 24 hours)  Comprehensive metabolic panel      Status: Abnormal   Collection Time: 09/16/23 12:45 PM  Result Value Ref Range   Sodium 134 (L) 135 - 145 mmol/L   Potassium 4.2 3.5 - 5.1 mmol/L   Chloride 103 98 - 111 mmol/L   CO2 22 22 - 32 mmol/L   Glucose, Bld 235 (H) 70 - 99 mg/dL   BUN 19 8 - 23 mg/dL   Creatinine, Ser 8.46 (H) 0.61 - 1.24 mg/dL   Calcium  8.6 (L) 8.9 - 10.3 mg/dL   Total Protein 5.3 (L) 6.5 - 8.1 g/dL   Albumin  1.8 (L) 3.5 - 5.0 g/dL   AST 14 (L) 15 - 41 U/L   ALT 11 0 - 44 U/L   Alkaline Phosphatase 91 38 - 126 U/L   Total Bilirubin 0.4 0.0 - 1.2 mg/dL   GFR, Estimated 49 (L) >60 mL/min   Anion gap 9 5 - 15  CBC with Differential     Status: Abnormal   Collection Time: 09/16/23 12:45 PM  Result Value Ref Range   WBC 7.6 4.0 - 10.5 K/uL   RBC 2.76 (L) 4.22 - 5.81 MIL/uL   Hemoglobin 8.1 (L) 13.0 - 17.0 g/dL   HCT 74.9 (L) 60.9 - 47.9 %   MCV 90.6 80.0 - 100.0 fL   MCH 29.3 26.0 - 34.0 pg   MCHC 32.4 30.0 - 36.0 g/dL   RDW 84.4 88.4 - 84.4 %   Platelets 323 150 - 400 K/uL   nRBC 0.0 0.0 - 0.2 %   Neutrophils Relative % 70 %   Neutro Abs 5.3 1.7 -  7.7 K/uL   Lymphocytes Relative 19 %   Lymphs Abs 1.4 0.7 - 4.0 K/uL   Monocytes Relative 6 %   Monocytes Absolute 0.4 0.1 - 1.0 K/uL   Eosinophils Relative 3 %   Eosinophils Absolute 0.2 0.0 - 0.5 K/uL   Basophils Relative 1 %   Basophils Absolute 0.1 0.0 - 0.1 K/uL   Immature Granulocytes 1 %   Abs Immature Granulocytes 0.04 0.00 - 0.07 K/uL  Protime-INR     Status: Abnormal   Collection Time: 09/16/23 12:45 PM  Result Value Ref Range   Prothrombin Time 18.7 (H) 11.4 - 15.2 seconds   INR 1.5 (H) 0.8 - 1.2  I-Stat Lactic Acid, ED     Status: None   Collection Time: 09/16/23  1:08 PM  Result Value Ref Range   Lactic Acid, Venous 0.9 0.5 - 1.9 mmol/L   *Note: Due to a large number of results and/or encounters for the requested time period, some results have not been displayed. A complete set of results can be found in Results Review.    Imaging  results:  DG Ankle Complete Right Result Date: 09/16/2023 CLINICAL DATA:  Ankle infection. EXAM: RIGHT ANKLE - COMPLETE 3+ VIEW COMPARISON:  None Available. FINDINGS: Fixation rod traversing the calcaneus, talus, and tibia. There is old fracture of the distal fibula with callus formation. 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 There is diffuse skin thickening and edema. Vascular calcification noted. No soft tissue gas. IMPRESSION: 1. No acute fracture or dislocation. 2. Diffuse skin thickening and edema. Electronically Signed   By: Vanetta Chou M.D.   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  Appreciate ortho f/u Will let Dr Overton know he is in hospital.   Will continue SSI.     Eben Reyes BROCKS, MD

## 2023-09-16 NOTE — ED Notes (Signed)
 Lab called regarding collected samples not currently in process. Lab endorses samples are in lab. MD notified.

## 2023-09-16 NOTE — Consult Note (Signed)
 Reason for Consult:Right ankle infection Referring Physician: Glendia Breeding Time called: 1443 Time at bedside: 1451   Harold [name] is an 68 y.o. male.  HPI: Josiah comes to the ED with 2 weeks of right ankle sores, pain and swelling. It started with the sores and then about a week later began to have the pain and then finally the swelling set in over the weekend. It started soon after he was putting weight down on it. He denies fevers, chills, sweats, N/V. He is still at [facility].  Past Medical History:  Diagnosis Date   Anemia    Anginal pain (HCC) 04/2013   Cardiac cath showed patent stents with distal LAD, circumflex-OM and RCA disease in small vessels.   Anxiety    Atrial fibrillation (HCC) 12/2021   CAD S/P percutaneous coronary angioplasty 2003, 04/2012   status post PCI to LAD, circumflex-OM 2, RCA   Carotid artery occlusion    Chronic renal insufficiency, stage II (mild)    Chronic venous insufficiency    varicosities, no reflux; dopplers 04/14/12- valvular insufficiency in the R and L GSV   Complication of anesthesia    COPD, mild (HCC)    Depression    situaltional    Diabetes mellitus    Diabetic neuropathy (HCC) 08/24/2019   Dyslipidemia associated with type 2 diabetes mellitus (HCC)    Fall at home, initial encounter 01/02/2023   GERD (gastroesophageal reflux disease)    History of cardioversion 12/2021   at [facility]   HTN (hypertension)    Hypothyroidism    Neuromuscular disorder (HCC)    neuropathy in feet   Neuropathy    notably improved following PCI with improved cardiac function   Obesity    Obesity, Class II, BMI 35.0-39.9, with comorbidity (see actual BMI)    BMI 39; wgt loss efforts in place; seeing Dietician   Open ankle fracture 01/02/2023   Orthostatic hypotension    PONV (postoperative nausea and vomiting)    [employer] [place]   Pulmonary hypertension (HCC) 04/2012   PA pressure   Sleep apnea    uses nightly   Vitamin D  deficiency     per facility notes    Past Surgical History:  Procedure Laterality Date   ABDOMINAL AORTAGRAM N/A 11/15/2011   Procedure: ABDOMINAL EZELLA;  Surgeon: Carlin FORBES Haddock, MD;  Location: [facility] CATH LAB;  Service: Cardiovascular;  [name];   ABDOMINAL AORTOGRAM W/LOWER EXTREMITY N/A 10/13/2019   Procedure: ABDOMINAL AORTOGRAM W/LOWER EXTREMITY;  Surgeon: Darron Deatrice LABOR, MD;  [facility]: [facility] INVASIVE CV LAB;  Service: CV: Non-obst Ao-Iliac. R SFA-PopA patent with significant calcific stenosis of TP trunk-prox PTA (dominant) & CTO ATA  -> R PTA-TP PTCA   ABDOMINAL AORTOGRAM W/LOWER EXTREMITY N/A 11/06/2022   Procedure: ABDOMINAL AORTOGRAM W/LOWER EXTREMITY;  Surgeon: Darron Deatrice LABOR, MD;  [facility]: [facility] INVAS[facility] CV LAB;  Service: Cardiovascular;  [name];   ABDOMINAL AORTOGRAM W/LOWER EXTREMITY Right 04/01/2023   Procedure: ABDOMINAL AORTOGRAM W/LOWER EXTREMITY;  Surgeon: Serene Gaile ORN, MD;  [facility]: [facility] INVAS[facility] CV LAB;  Service: Cardiovascular;  Laterality: Right;   ABIs  04/27/2012   mild bilateral arterial insufficiency   ANKLE FUSION Right 05/15/2023   Procedure: ARTHRODESIS ANKLE WITH HINDFOOT FUSION NAIL;  Surgeon: Kendal Franky SQUIBB, MD;  [name];  Service: Orthopedics;  Laterality: Right;   APPLICATION OF WOUND VAC Right 03/31/2023   Procedure: APPLICATION OF WOUND VAC;  Surgeon: Kendal Franky SQUIBB, MD;  [name];  Service:  Orthopedics;  Laterality: Right;   BACK SURGERY  2005 x1   X2-2010   BUNIONECTOMY Right 11/11/2013   Procedure: RIGHT FOOT SILVER BUNIONECTOMY;  Surgeon: Norleen Armor, MD;  Location: [facility];  Service: Orthopedics;  Laterality: Right;   CARDIAC CATHETERIZATION  12/11/2001   significant 3V CAD, normal LV function   CARDIOVERSION N/A 03/13/2022   Procedure: CARDIOVERSION;  Surgeon: Kate Lonni CROME, MD;  Location: [facility] ENDOSCOPY;  Service: Cardiovascular;  [name];   CARDIOVERSION N/A 07/25/2022   Procedure:  CARDIOVERSION;  Surgeon: Jeffrie Oneil BROCKS, MD;  [facility] LAB;  Service: Cardiovascular;  [name];   Carotid Doppler  04/27/2012   right internal carotid: Elevated velocities but no evidence of plaque. Left internal carotid 40-59%   CAROTID ENDARTERECTOMY  2005   Right; recent carotid Dopplers notes elevated velocities.    colonscopy     CORONARY ANGIOPLASTY  12/21/2001   PTCA of the distal and mid AV groove circ, unsuccessful PTCA of second OM total occlusion, unsuccessful PTCA of the apical LAD total occlusion   CORONARY ANGIOPLASTY WITH STENT PLACEMENT  04/29/2012   PCI to 3 RCA lesions, Promus Premiere 2.66mmx8mm distally, mid was 2.59mx28mm and proximally 2.75x10mm, EF 55-60%   CORONARY STENT PLACEMENT  04/28/2012   PCI to LAD (3x49mm Xience DES postdilated to 3.25) and circ prox and mid (2 overlappinmg 2.56mmx12mmXience DES postdilated to 2.37mm)   DOPPLER ECHOCARDIOGRAPHY  04/28/2012   poor quality study: EF estimated 60-65%; unable to assess diastolic function (previously noted to have diastolic dysfunction); severely dilated left atrium and mild right atrium; dilated IVC consistent with increased central venous pressure.SABRA   EXTERNAL FIXATION LEG Right 05/12/2023   Procedure: EXTERNAL FIXATION, LOWER EXTREMITY;  Surgeon: Kendal Franky SQUIBB, MD;  [name];  Service: Orthopedics;  Laterality: Right;   HARDWARE REMOVAL Right 03/31/2023   Procedure: HARDWARE REMOVAL RIGHT ANKLE;  Surgeon: Kendal Franky SQUIBB, MD;  [name];  Service: Orthopedics;  Laterality: Right;   HARDWARE REMOVAL Right 01/28/2023   Procedure: REMOVAL OF HARDWARE RIGHT ANKLE;  [name], MD;  [name];  Service: Orthopedics;  Laterality: Right;   HARDWARE REMOVAL Right 06/21/2023   Procedure: REMOVAL, HARDWARE;  Surgeon: Kendal Franky SQUIBB, MD;  [name];  Service: Orthopedics;  Laterality: Right;   HERNIA REPAIR     I & D EXTREMITY Right 01/02/2023   Procedure:  IRRIGATION AND DEBRIDEMENT RIGHT ANKLE;  [name], MD;  [name];  Service: Orthopedics;  Laterality: Right;   I & D EXTREMITY Right 04/02/2023   Procedure: DEBRIDEMENT RIGHT ANKLE;  Surgeon: Harden Jerona GAILS, MD;  Location[facility] OR;  Service: Orthopedics;  Laterality: Right;   IR FLUORO GUIDE CV LINE LEFT  04/22/2023   IR FLUORO GUIDE CV LINE RIGHT  04/07/2023   IR PATIENT EVAL TECH 0-60 MINS  04/07/2023   IR RADIOLOGIST EVAL & MGMT  04/08/2023   IR REMOVAL TUN CV CATH W/O FL  05/05/2023   IR US  GUIDE VASC ACCESS LEFT  04/22/2023   IR US  GUIDE VASC ACCESS RIGHT  04/07/2023   LEFT AND RIGHT HEART CATHETERIZATION WITH CORONARY ANGIOGRAM N/A 04/27/2012   Procedure: LEFT AND RIGHT HEART CATHETERIZATION WITH CORONARY ANGIOGRAM;  [name], MD;  Location: [facility] CATH LAB;  Service: Cardiovascular;  [name];   LEFT AND RIGHT HEART CATHETERIZATION WITH CORONARY ANGIOGRAM N/A 05/14/2013   Procedure: LEFT AND RIGHT HEART CATHETERIZATION WITH CORONARY ANGIOGRAM;  [name], MD;  Location: [facility] CATH LAB: Moderate Pulm HTN: 46/16 - mean 33 mmHg; PCWP ;; [name], [name], Patent RCA ostial mid and distal stents - 70% distal runoff Dz   LUMBAR LAMINECTOMY/DECOMPRESSION MICRODISCECTOMY  01/07/2012   Procedure: LUMBAR LAMINECTOMY/DECOMPRESSION MICRODISCECTOMY 1 LEVEL;  [name], MD;  [name];  Service: Neurosurgery;  Laterality: Bilateral;   Lumbar Three-Four Decompression   LUMBAR LAMINECTOMY/DECOMPRESSION MICRODISCECTOMY N/A 07/26/2020   Procedure: Lumbar two-three Laminectomy/Foraminotomy;  [name], MD;  [name];  Service: Neurosurgery;  [name];   ORIF ANKLE FRACTURE Right 01/02/2023   Procedure: OPEN REDUCTION INTERNAL FIXATION (ORIF)RIGHT ANKLE FRACTURE;  [name], MD;  [name];   Service: Orthopedics;  Laterality: Right;   PERCUTANEOUS CORONARY STENT INTERVENTION (PCI-S) N/A 04/28/2012   Procedure: PERCUTANEOUS CORONARY STENT INTERVENTION (PCI-S);  [name], MD;  Location: [facility] CATH LAB;  Service: Cardiovascular;  [name];   PERCUTANEOUS CORONARY STENT INTERVENTION (PCI-S) N/A 04/29/2012   Procedure: PERCUTANEOUS CORONARY STENT INTERVENTION (PCI-S);  [name], MD;  Location: [facility] CATH LAB;  Service: Cardiovascular;  [name];   PERIPHERAL VASCULAR ATHERECTOMY  11/06/2022   Procedure: PERIPHERAL VASCULAR ATHERECTOMY;  Surgeon: Darron Deatrice DELENA, MD;  [facility];  Service: Cardiovascular;;   PERIPHERAL VASCULAR BALLOON ANGIOPLASTY Right 10/13/2019   Procedure: PERIPHERAL VASCULAR BALLOON ANGIOPLASTY;  Surgeon: Darron Deatrice DELENA, MD;  [facility];  Service: Cardiovascular;  Laterality: Right;  PTCA of R TP Trunk into PTA (Drug-coated) => Follow-up ABIs 0.9 on the right and 0.99 on the left.   PERIPHERAL VASCULAR BALLOON ANGIOPLASTY Right 04/01/2023   Procedure: PERIPHERAL VASCULAR BALLOON ANGIOPLASTY;  Surgeon: Serene Gaile LELON, MD;  [facility] CV LAB;  Service: Cardiovascular;  Laterality: Right;   PERIPHERAL VASCULAR INTERVENTION Right 04/01/2023   Procedure: PERIPHERAL VASCULAR INTERVENTION;  Surgeon: Serene Gaile LELON, MD;  [facility]AS[facility] CV LAB;  Service: Cardiovascular;  Laterality: Right;   SPINE SURGERY     UMBILICAL HERNIA REPAIR  2009   steel mesh insert    Family History  Adopted: Yes  Family history unknown: Yes    Social History:  reports that he quit smoking about 20 years ago. His smoking use included cigarettes. He started smoking about 62 years ago. He has a 42 pack-year smoking history. He has never used smokeless tobacco. He reports that he does not drink alcohol and does not use drugs.  Allergies:  Allergies  Allergen Reactions   Beta-Lactamase Inhibitors  (Beta-Lactam) Hives, Itching and Rash    AX - penicillin     Ancef  given 9/24 with no obvious reaction   Other Nausea And Vomiting    General Anesthesia - sometimes causes nausea and vomiting * OK to use scopolamine  patch     Wellbutrin [Bupropion] Other (See Comments)    Mood Changes   Metformin  And Related Diarrhea and Nausea Only   Sulfa Antibiotics Hives and Other (See Comments)    Bactrim   Tetracyclines & Related Hives and Rash    doxycycline     Medications: I have reviewed the patient's current medications.  Results for orders placed or performed during the hospital encounter of 09/16/23 (from the past 48 hours)  Comprehensive metabolic panel     Status: Abnormal   Collection Time: 09/16/23 12:45 PM  Result Value Ref Range   Sodium 134 (L) 135 - 145 mmol/L   Potassium 4.2 3.5 - 5.1 mmol/L   Chloride 103 98 - 111 mmol/L   CO2 22 22 - 32 mmol/L   Glucose, Bld 235 (H) 70 - 99 mg/dL    Comment: Glucose reference range applies only to samples taken after fasting for at least 8 hours.   BUN 19 8 - 23 mg/dL   Creatinine, Ser 8.46 (H) 0.61 - 1.24 mg/dL   Calcium  8.6 (L) 8.9 - 10.3 mg/dL   Total Protein 5.3 (L) 6.5 - 8.1 g/dL   Albumin  1.8 (L) 3.5 - 5.0 g/dL   AST 14 (L) 15 - 41 U/L   ALT 11 0 - 44 U/L   Alkaline Phosphatase 91 38 - 126 U/L   Total Bilirubin 0.4 0.0 - 1.2 mg/dL   GFR, Estimated 49 (L) >60 mL/min    Comment: (NOTE) Calculated using the CKD-EPI Creatinine Equation (2021)    Anion gap 9 5 - 15    Comment: Performed at [facility], [address] [place], [place] [address]  CBC with Differential     Status: Abnormal   Collection Time: 09/16/23 12:45 PM  Result Value Ref Range   WBC 7.6 4.0 - 10.5 K/uL   RBC 2.76 (L) 4.22 - 5.81 MIL/uL   Hemoglobin 8.1 (L) 13.0 - 17.0 g/dL   HCT 74.9 (L) 60.9 - 47.9 %   MCV 90.6 80.0 - 100.0 fL   MCH 29.3 26.0 - 34.0 pg   MCHC 32.4 30.0 - 36.0 g/dL   RDW 84.4 88.4 - 84.4 %   Platelets 323 150 - 400 K/uL   nRBC 0.0  0.0 - 0.2 %   Neutrophils Relative % 70 %   Neutro Abs 5.3 1.7 - 7.7 K/uL   Lymphocytes Relative 19 %   Lymphs Abs 1.4 0.7 - 4.0 K/uL   Monocytes Relative 6 %   Monocytes Absolute 0.4 0.1 - 1.0 K/uL   Eosinophils Relative 3 %   Eosinophils Absolute 0.2 0.0 - 0.5 K/uL   Basophils Relative 1 %   Basophils Absolute 0.1 0.0 - 0.1 K/uL   Immature Granulocytes 1 %   Abs Immature Granulocytes 0.04 0.00 - 0.07 K/uL    Comment: Performed at [facility] Lab, [address] [place], [place] [address]  Protime-INR     Status: Abnormal   Collection Time: 09/16/23 12:45 PM  Result Value Ref Range   Prothrombin Time 18.7 (H) 11.4 - 15.2 seconds   INR 1.5 (H) 0.8 - 1.2    Comment: (NOTE) INR goal varies based on device and disease states. Performed at [facility], [address] [place], [place] [address]   I-Stat Lactic Acid, ED     Status: None   Collection Time: 09/16/23  1:08 PM  Result Value Ref Range   Lactic Acid, Venous 0.9 0.5 - 1.9 mmol/L   *Note: Due to a large number of results and/or encounters for the requested time period, some results have not been displayed. A complete set of results can be found in Results Review.    DG Ankle Complete Right Result Date: 09/16/2023 CLINICAL DATA:  Ankle infection. EXAM: RIGHT ANKLE - COMPLETE 3+ VIEW COMPARISON:  None Available. FINDINGS: Fixation rod traversing the calcaneus, talus, and tibia. There is old fracture of the distal fibula with callus formation. 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 There is diffuse  skin thickening and edema. Vascular calcification noted. No soft tissue gas. IMPRESSION: 1. No acute fracture or dislocation. 2. Diffuse skin thickening and edema. Electronically Signed   By: Vanetta Chou M.D.   On: 09/16/2023 14:05    Review of Systems  HENT:  Negative for ear discharge, ear pain, hearing loss and tinnitus.   Eyes:  Negative for photophobia and pain.  Respiratory:  Negative for  cough and shortness of breath.   Cardiovascular:  Negative for chest pain.  Gastrointestinal:  Negative for abdominal pain, nausea and vomiting.  Genitourinary:  Negative for dysuria, flank pain, frequency and urgency.  Musculoskeletal:  Positive for arthralgias (Right ankle). Negative for back pain, myalgias and neck pain.  Neurological:  Negative for dizziness and headaches.  Hematological:  Does not bruise/bleed easily.  Psychiatric/Behavioral:  The patient is not nervous/anxious.    Blood pressure 131/62, pulse 61, temperature 98.8 F (37.1 C), temperature source Oral, resp. rate 14, height 6' 1 (1.854 m), weight 102.5 kg, SpO2 100%. Physical Exam Constitutional:      General: He is not in acute distress.    Appearance: He is well-developed. He is not diaphoretic.  HENT:     Head: Normocephalic and atraumatic.  Eyes:     General: No scleral icterus.       Right eye: No discharge.        Left eye: No discharge.     Conjunctiva/sclera: Conjunctivae normal.  Cardiovascular:     Rate and Rhythm: Normal rate and regular rhythm.  Pulmonary:     Effort: Pulmonary effort is normal. No respiratory distress.  Musculoskeletal:     Cervical back: Normal range of motion.  Feet:     Comments: RLE No traumatic wounds or ecchymoses. Ankle and hindfoot erythematous with multiple ulcerations, punctate wounds, and bullae with some bloody discharge.  Mod diffuse TTP  No ankle effusion  Sens DPN, SPN, TN intact  Motor EHL, ext, flex, evers 5/5  DP 1+, PT 1+, 3+ mildly pitting edema Skin:    General: Skin is warm and dry.  Neurological:     Mental Status: He is alert.  Psychiatric:        Mood and Affect: Mood normal.        Behavior: Behavior normal.     Assessment/Plan: Right ankle infection -- Will have medicine admit, start IV abx, and will get MRI ankle/foot. Ok to eat today.    Ozell DOROTHA Ned, PA-C Orthopedic Surgery [phone] 09/16/2023, 3:00 PM

## 2023-09-16 NOTE — ED Notes (Signed)
 Pt in MRI.

## 2023-09-16 NOTE — H&P (Cosign Needed)
 Date: 09/16/2023               Patient Name:  Harold [name] MRN: [id number]  DOB: Jun 30, 1955 Age / Sex: 68 y.o., male   PCP: Default, Provider, MD         Medical Service: Internal Medicine Teaching Service         Attending Physician: Dr. Reyes Fenton      First Contact: Geryl King, DO    Second Contact: Dr. Libby Blanch, DO          Pager Information: First Contact Pager: [phone]   Second Contact Pager: [phone]   SUBJECTIVE   Chief Complaint: severe right ankle pain with wound drainage  History of Present Illness: Harold [name] is a 68 y.o. male with PMH of carotid artery disease, OSA on CPAP, peripheral arterial disease, HTN, obesity, chronic kidney disease 3b, type 2 diabetes, atrial fibrillation, and hyperlipidemia. Patient has had an extensive medical course in regards to his right ankle.   Patient accompanied by spouse who helps provide history.   He first broke the ankle in Nov 2024 after a fall. He underwent a surgical repair and subsequently had an infection in the ankle.  He has had several hospitalizations since breaking his foot and has been in several different rehabilitation facilities. Today he arrived from [facility] and has a pain level of 35-40 out of 10. He was cleared to walk a month ago and went for a long, 40 feet, walk and that's when he first noticed severe pain in his right foot. He didn't think much of it but the pain got worse and worse. Around July 17-19th, he first noticed the blisters appearing. The blisters start small and inflamed like a pimple, then get bigger and look like blood blisters. He has similar blistering on his left leg that was managed at wound care for over a year until they finally healed with some residual scarring. The wound care facility was in [place] [facility], where Dr. Lonia was their doctor. The spouse states they were never able to figure out what kind of infection it was. Just when patient finished  wound care for left leg, he broke his right ankle.   Since breaking right leg, patient has not felt well. He has lost over 100 pounds due to lack of appetite and change of taste. He has diabetes but has not been able to get his weekly mounjaro  for several weeks because he has either been in rehab or in the hospital.  Patient denies fevers, chills, changes in urination, changes in stooling, or red streaking extending up the right leg. Patient has been on several antibiotics over the last year, spouse states one was hard on his kidneys so they stopped it and started another. Spouse recalls an encounter where patient was sent to the hospital because his hemoglobin was 5.   This has been a difficult time for patient and spouse, spouse states he isnt able to do everything patient needs him to do, alone. They have caregivers when at home that help with his care.    ED Course: Labs significant for Na 134, glucose 235, BUN 19, Cr 1.53, GFR 49, lactic acid .9, Hgb 8.1, pending blood cultures X2  Imaging: Ankle Xray no acute fx or dislocation, diffuse skin thickening and edema. Pending MRI ankle and foot  Received  IV Ceftriaxone , Flagyl , and Vanc in ED  Consulted ortho surg and IMTS  Meds:  Tylenol  500 mg every 6  hours as needed Allopurinol  50 mg daily Amino acids 30 mL twice daily Amiodarone  200 mg twice daily (paused) Eliquis  5 mg twice daily Lipitor 40 mg daily Plavix  75 mg daily Colace 100 mg twice daily Cymbalta  20 mg daily Zetia  10 mg daily Ensure Ferrous sulfate  325 mg twice daily, Monday, Wednesday, Friday Finasteride  5 mg daily Gabapentin  300 mg nightly Lantus  5 units nightly Humalog sliding scale Synthroid  88 mcg daily Magnesium  oxide 1 tablet daily Melatonin 3 mg daily Robaxin  5 mg every 6 hours as needed Midodrine  10 mg, pulse Multivitamin Nitroglycerin  tablet Protonix  20 mg daily MiraLAX  Zoloft  100 mg daily Mounjaro  15 mg weekly Trazodone  50 mg nightly Vitamin D   50,000 IU weekly  Has not had mounjaro  weekly injections in several months.    Past Medical History DMT2 with neuropathy requiring insulin , HTN, CAD with carotid artery stenosis, GERD, HLD, COPD, HFpEF, PVD, CKD3b, PVD, Anemia, chronic septic right ankle, OSA on CPAP, pulmonary HTN, obesity with significant recent weight loss (>100lb), atrial fibrillation, QT prolongation  Past Surgical History Past Surgical History:  Procedure Laterality Date   ABDOMINAL AORTAGRAM N/A 11/15/2011   Procedure: ABDOMINAL EZELLA;  Surgeon: Carlin FORBES Haddock, MD;  Location: [facility] CATH LAB;  Service: Cardiovascular;  [name];   ABDOMINAL AORTOGRAM W/LOWER EXTREMITY N/A 10/13/2019   Procedure: ABDOMINAL AORTOGRAM W/LOWER EXTREMITY;  Surgeon: Darron Deatrice LABOR, MD;  Location: [facility] INVASIVE CV LAB;  Service: CV: Non-obst Ao-Iliac. R SFA-PopA patent with significant calcific stenosis of TP trunk-prox PTA (dominant) & CTO ATA  -> R PTA-TP PTCA   ABDOMINAL AORTOGRAM W/LOWER EXTREMITY N/A 11/06/2022   Procedure: ABDOMINAL AORTOGRAM W/LOWER EXTREMITY;  Surgeon: Darron Deatrice LABOR, MD;  Location: [facility] INVAS[facility] CV LAB;  Service: Cardiovascular;  [name];   ABDOMINAL AORTOGRAM W/LOWER EXTREMITY Right 04/01/2023   Procedure: ABDOMINAL AORTOGRAM W/LOWER EXTREMITY;  Surgeon: Serene Gaile ORN, MD;  Location: [facility] INVASIVE CV LAB;  Service: Cardiovascular;  Laterality: Right;   ABIs  04/27/2012   mild bilateral arterial insufficiency   ANKLE FUSION Right 05/15/2023   Procedure: ARTHRODESIS ANKLE WITH HINDFOOT FUSION NAIL;  Surgeon: Kendal Franky SQUIBB, MD;  [name];  Service: Orthopedics;  Laterality: Right;   APPLICATION OF WOUND VAC Right 03/31/2023   Procedure: APPLICATION OF WOUND VAC;  Surgeon: Kendal Franky SQUIBB, MD;  [name];  Service: Orthopedics;  Laterality: Right;   BACK SURGERY  2005 x1   X2-2010   BUNIONECTOMY Right 11/11/2013   Procedure: RIGHT FOOT SILVER BUNIONECTOMY;  Surgeon: Norleen Armor, MD;   Location: [facility];  Service: Orthopedics;  Laterality: Right;   CARDIAC CATHETERIZATION  12/11/2001   significant 3V CAD, normal LV function   CARDIOVERSION N/A 03/13/2022   Procedure: CARDIOVERSION;  Surgeon: Kate Lonni CROME, MD;  Location: [facility] ENDOSCOPY;  Service: Cardiovascular;  [name];   CARDIOVERSION N/A 07/25/2022   Procedure: CARDIOVERSION;  Surgeon: Jeffrie Oneil BROCKS, MD;  [facility];  Service: Cardiovascular;  [name];   Carotid Doppler  04/27/2012   right internal carotid: Elevated velocities but no evidence of plaque. Left internal carotid 40-59%   CAROTID ENDARTERECTOMY  2005   Right; recent carotid Dopplers notes elevated velocities.    colonscopy     CORONARY ANGIOPLASTY  12/21/2001   PTCA of the distal and mid AV groove circ, unsuccessful PTCA of second OM total occlusion, unsuccessful PTCA of the apical LAD total occlusion   CORONARY ANGIOPLASTY WITH STENT PLACEMENT  04/29/2012   PCI to  3 RCA lesions, Promus Premiere 2.26mmx8mm distally, mid was 2.61mx28mm and proximally 2.75x64mm, EF 55-60%   CORONARY STENT PLACEMENT  04/28/2012   PCI to LAD (3x56mm Xience DES postdilated to 3.25) and circ prox and mid (2 overlappinmg 2.54mmx12mmXience DES postdilated to 2.40mm)   DOPPLER ECHOCARDIOGRAPHY  04/28/2012   poor quality study: EF estimated 60-65%; unable to assess diastolic function (previously noted to have diastolic dysfunction); severely dilated left atrium and mild right atrium; dilated IVC consistent with increased central venous pressure.SABRA   EXTERNAL FIXATION LEG Right 05/12/2023   Procedure: EXTERNAL FIXATION, LOWER EXTREMITY;  Surgeon: Kendal Franky SQUIBB, MD;  [name];  Service: Orthopedics;  Laterality: Right;   HARDWARE REMOVAL Right 03/31/2023   Procedure: HARDWARE REMOVAL RIGHT ANKLE;  Surgeon: Kendal Franky SQUIBB, MD;  [name];  Service: Orthopedics;  Laterality: Right;   HARDWARE REMOVAL Right 01/28/2023    Procedure: REMOVAL OF HARDWARE RIGHT ANKLE;  [name], MD;  [name];  Service: Orthopedics;  Laterality: Right;   HARDWARE REMOVAL Right 06/21/2023   Procedure: REMOVAL, HARDWARE;  Surgeon: Kendal Franky SQUIBB, MD;  [name];  Service: Orthopedics;  Laterality: Right;   HERNIA REPAIR     I & D EXTREMITY Right 01/02/2023   Procedure: IRRIGATION AND DEBRIDEMENT RIGHT ANKLE;  [name], MD;  [name];  Service: Orthopedics;  Laterality: Right;   I & D EXTREMITY Right 04/02/2023   Procedure: DEBRIDEMENT RIGHT ANKLE;  Surgeon: Harden Jerona GAILS, MD;  Location: [facility] OR;  Service: Orthopedics;  Laterality: Right;   IR FLUORO GUIDE CV LINE LEFT  04/22/2023   IR FLUORO GUIDE CV LINE RIGHT  04/07/2023   IR PATIENT EVAL TECH 0-60 MINS  04/07/2023   IR RADIOLOGIST EVAL & MGMT  04/08/2023   IR REMOVAL TUN CV CATH W/O FL  05/05/2023   IR US  GUIDE VASC ACCESS LEFT  04/22/2023   IR US  GUIDE VASC ACCESS RIGHT  04/07/2023   LEFT AND RIGHT HEART CATHETERIZATION WITH CORONARY ANGIOGRAM N/A 04/27/2012   Procedure: LEFT AND RIGHT HEART CATHETERIZATION WITH CORONARY ANGIOGRAM;  [name], MD;  Location: [facility] CATH LAB;  Service: Cardiovascular;  [name];   LEFT AND RIGHT HEART CATHETERIZATION WITH CORONARY ANGIOGRAM N/A 05/14/2013   Procedure: LEFT AND RIGHT HEART CATHETERIZATION WITH CORONARY ANGIOGRAM;  [name], MD;  [name]; PCWP ;; [name], [name], Patent RCA ostial mid and distal stents - 70% distal runoff Dz   LUMBAR LAMINECTOMY/DECOMPRESSION MICRODISCECTOMY  01/07/2012   Procedure: LUMBAR LAMINECTOMY/DECOMPRESSION MICRODISCECTOMY 1 LEVEL;  [name], MD;  [name];  Service: Neurosurgery;  Laterality: Bilateral;   Lumbar Three-Four Decompression   LUMBAR  LAMINECTOMY/DECOMPRESSION MICRODISCECTOMY N/A 07/26/2020   Procedure: Lumbar two-three Laminectomy/Foraminotomy;  [name], MD;  [name];  Service: Neurosurgery;  [name];   ORIF ANKLE FRACTURE Right 01/02/2023   Procedure: OPEN REDUCTION INTERNAL FIXATION (ORIF)RIGHT ANKLE FRACTURE;  [name], MD;  [name];  Service: Orthopedics;  Laterality: Right;   PERCUTANEOUS CORONARY STENT INTERVENTION (PCI-S) N/A 04/28/2012   Procedure: PERCUTANEOUS CORONARY STENT INTERVENTION (PCI-S);  [name], MD;  Location: [facility] CATH LAB;  Service: Cardiovascular;  [name];   PERCUTANEOUS CORONARY STENT INTERVENTION (PCI-S) N/A 04/29/2012   Procedure: PERCUTANEOUS  CORONARY STENT INTERVENTION (PCI-S);  [name], MD;  Location: [facility] CATH LAB;  Service: Cardiovascular;  [name];   PERIPHERAL VASCULAR ATHERECTOMY  11/06/2022   Procedure: PERIPHERAL VASCULAR ATHERECTOMY;  Surgeon: Darron Deatrice LABOR, MD;  [facility];  Service: Cardiovascular;;   PERIPHERAL VASCULAR BALLOON ANGIOPLASTY Right 10/13/2019   Procedure: PERIPHERAL VASCULAR BALLOON ANGIOPLASTY;  Surgeon: Darron Deatrice LABOR, MD;  [facility] LAB;  Service: Cardiovascular;  Laterality: Right;  PTCA of R TP Trunk into PTA (Drug-coated) => Follow-up ABIs 0.9 on the right and 0.99 on the left.   PERIPHERAL VASCULAR BALLOON ANGIOPLASTY Right 04/01/2023   Procedure: PERIPHERAL VASCULAR BALLOON ANGIOPLASTY;  Surgeon: Serene Gaile LELON, MD;  [facility] INVAS[facility] CV LAB;  Service: Cardiovascular;  Laterality: Right;   PERIPHERAL VASCULAR INTERVENTION Right 04/01/2023   Procedure: PERIPHERAL VASCULAR INTERVENTION;  Surgeon: Serene Gaile LELON, MD;  Location: [facility] INVAS[facility] CV LAB;  Service: Cardiovascular;  Laterality: Right;   SPINE SURGERY     UMBILICAL HERNIA REPAIR  2009   steel mesh insert     Social:  Lives with: In [place] right now  Support:  Good support  PCP: [facility] , Dr. Andree  Level of function: Dependent in all ADLs and iADLs  Substances: No substances   Family History:  Family History  Adopted: Yes  Family history unknown: Yes     Allergies: Allergies as of 09/16/2023 - Review Complete 09/16/2023  Allergen Reaction Noted   Beta-lactamase inhibitors (beta-lactam) Hives, Itching, and Rash 06/21/2023   Other Nausea And Vomiting 01/03/2012   Wellbutrin [bupropion] Other (See Comments) 03/16/2021   Metformin  and related Diarrhea and Nausea Only 03/07/2015   Sulfa antibiotics Hives and Other (See Comments) 06/21/2023   Tetracyclines & related Hives and Rash 06/21/2023    Review of Systems: A complete ROS was negative except as per HPI.   OBJECTIVE:   Physical Exam: Blood pressure 131/62, pulse 61, temperature 98.7 F (37.1 C), temperature source Oral, resp. rate 14, height 6' 1 (1.854 m), weight 102.5 kg, SpO2 100%.  Constitutional: uncomfortable, lying in ED bed, in no acute distress but grimacing due to right ankle pain HENT: normocephalic atraumatic, mucous membranes moist Eyes: conjunctiva non-erythematous Neck: supple Cardiovascular: no m/r/g Pulmonary/Chest: normal work of breathing on room air, lungs clear to auscultation bilaterally Abdominal: soft, non-tender, non-distended MSK: deconditioned Neurological: alert & oriented x 3, unable to feel sensation right toes, normal sensation left toes Skin: right ankle wrapped in the ED, bright red blood weeping through the gauze at the heel and medial ankle, pictures below.  Tense dark red bullae over achilles insertion to calcaneous (pictured in ED note).  Post inflammatory hyperpigmentation on left anterior leg. Left leg not edematous.  Superficial ecchymosis on both forearms, more severe on right than left Psych: appropriate mood   Right foot edema   Bright red blood through gauze on sole and medial ankle. Post inflammatory hyperpigmentation and  scarring on L left.   Labs: CBC    Component Value Date/Time   WBC 7.6 09/16/2023 1245   RBC 2.76 (L) 09/16/2023 1245   HGB 8.1 (L) 09/16/2023 1245   HGB 14.6 11/11/2022 1522   HCT 25.0 (L) 09/16/2023 1245   HCT 43.8 11/11/2022 1522   PLT 323 09/16/2023 1245   PLT 169 10/29/2022 1203   MCV 90.6 09/16/2023 1245   MCV 96 10/29/2022 1203   MCH 29.3 09/16/2023 1245   MCHC 32.4 09/16/2023 1245  RDW 15.5 09/16/2023 1245   RDW 14.5 10/29/2022 1203   LYMPHSABS 1.4 09/16/2023 1245   LYMPHSABS 1.6 05/12/2018 1150   MONOABS 0.4 09/16/2023 1245   EOSABS 0.2 09/16/2023 1245   EOSABS 0.2 05/12/2018 1150   BASOSABS 0.1 09/16/2023 1245   BASOSABS 0.1 05/12/2018 1150     CMP     Component Value Date/Time   NA 134 (L) 09/16/2023 1245   NA 141 11/14/2022 1624   K 4.2 09/16/2023 1245   CL 103 09/16/2023 1245   CO2 22 09/16/2023 1245   GLUCOSE 235 (H) 09/16/2023 1245   BUN 19 09/16/2023 1245   BUN 23 11/14/2022 1624   CREATININE 1.53 (H) 09/16/2023 1245   CREATININE 1.51 (H) 10/16/2015 1053   CALCIUM  8.6 (L) 09/16/2023 1245   PROT 5.3 (L) 09/16/2023 1245   PROT 6.4 05/12/2018 1150   ALBUMIN  1.8 (L) 09/16/2023 1245   ALBUMIN  4.2 05/12/2018 1150   AST 14 (L) 09/16/2023 1245   ALT 11 09/16/2023 1245   ALKPHOS 91 09/16/2023 1245   BILITOT 0.4 09/16/2023 1245   BILITOT 0.3 05/12/2018 1150   GFRNONAA 49 (L) 09/16/2023 1245   GFRNONAA 49 (L) 10/16/2015 1053   GFRAA 47 (L) 09/28/2019 1242   GFRAA 56 (L) 10/16/2015 1053    Imaging:  DG Ankle Complete Right Result Date: 09/16/2023 CLINICAL DATA:  Ankle infection. EXAM: RIGHT ANKLE - COMPLETE 3+ VIEW COMPARISON:  None Available. FINDINGS: Fixation rod traversing the calcaneus, talus, and tibia. There is old fracture of the distal fibula with callus formation. 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 There is diffuse skin thickening and edema. Vascular calcification noted. No soft tissue gas. IMPRESSION: 1. No acute  fracture or dislocation. 2. Diffuse skin thickening and edema. Electronically Signed   By: Vanetta Chou M.D.   On: 09/16/2023 14:05     EKG: personally reviewed my interpretation is NSR. Prior EKG in August 19, 2023 similar to this current one.   ASSESSMENT & PLAN:   Assessment & Plan by Problem: Principal Problem:   Septic arthritis of ankle and foot region [facility]) Active Problems:   Diabetes mellitus type 2 with neurological manifestations (HCC)   Essential hypertension   Neuropathy   Hyperlipidemia associated with type 2 diabetes mellitus (HCC)   CAD, LAD/CFX DES 04/28/12- staged RCA DES 04/29/12 after pt declined CABG, cath 05/14/13 stable CAD medical therapy   COPD (chronic obstructive pulmonary disease) (HCC)   Chronic heart failure with preserved ejection fraction (HFpEF) (HCC)   Gastroesophageal reflux disease without esophagitis   PVD (peripheral vascular disease) (HCC)   CKD stage 3b, GFR 30-44 ml/min (HCC)   Normocytic anemia   PAD (peripheral artery disease) (HCC)   Type 2 diabetes mellitus with diabetic nephropathy, with long-term current use of insulin  (HCC)   Stage 3a chronic kidney disease (HCC)   Harold [name] is a 68 y.o. 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  -Hardware removed by Dr. Kendal 06/21/2023. Hospital discharge on 07/22/23 with chronic septic joint completed  ertapenem  and daptomycin  on 07/31/2023.  -Right digits are swollen, weeping  skins lesions needing repeated dressing changes. Concern for infection high given patient pain level and history.  -WBC wnl and patient afebrile. Continue to monitor CBC and BMP -pending blood cultures 7/29 -Pending MRI 7/29 -Linezolid  IV 600 mg and monitor plts, CTX 2 g IV -hydromorphone  injection 0.5 mg for pain -holding anticoagulation pending ortho surg tentative plans, appreciate recommendations  #Peripheral Vascular Disease -Follows with Dr. Darron who in Aug 2021 did successful balloon angioplasty to tibioperoneal trunk in posterior tibial artery with drug-coated angioplasty. In sept 2024, patient had reccurent wounds on left leg which were healed after Dr. Darron did successful orbital atherectomy and drug-coated balloon angioplasty to left popliteal artery, TP trunk and posterior tibial artery.  -After fall in Nov 2024, Dr. Serene did Angiogram which showed calcified stenosis in distal right SFA, treated with Eluvia drug-eluting stent.  --holding anticoagulation pending ortho surg plan --Holding Plavix    #Qtc prolongation #Paroxysmal atrial fibrillation  -Afib diagnosed in 2023, refractory to [facility] on 03/13/22 and 03/27/22, Dr. Nancey transitioned to medical therapy starting amio -Hospitalization in June 2025, patient had Qtc prolongation (likely due to polypharmacy with prolong drugs) treated with amiodarone  with recommendations to avoid further Qtc prolong medications -Continue home meds: amiodarone  200 mg twice daily, apixaban  5 mg twice daily, Coreg  twice daily  #Diabetes Mellitus Type 2 -glucose 235 on admission  -goal sugars <180 for wound healing optimization -sliding scale   -Semglee  5 units daily   #Chronic Kidney Disease Stage 3b -patient denies symptoms concerning for infection, has been urinating normally  -has had dialysis in the past but not currently  -7/29 on admission Cr 1.53, BUN 19 -monitor electrolytes and urine output  #Coronary artery disease s/p CABG -home  meds: apixaban  5 mg twice daily, Coreg  twice daily -holding anticoagulation pending ortho surg plan -Holding plavix  -Resume home Lipitor 40 mg   #Chronic anemia -7/29 on admission hgb 8.1 -anticipate some blood loss if he is going to have surgery -monitor CBC closely   #Chronic HTN -Carvedilol  discontinued in July due to concern for orthostatic hypotension -160/68 on admission 7/29, patient in pain. Will monitor  -Hold home med: losartan    #OSA on CPAP -continue CPAP  #GAD #Depression -duloxetine  20 mg -trazodone  50 mg  #BPH -finasteride  5 mg  #GERD -continue pantoprazole  40 mg tablet  #COPD  #Hypothyroidism -continue home levothyroxine  88 mcg   Best practice: Diet: Normal VTE: Enoxaparin  Code: Full  Disposition planning: Prior to Admission Living Arrangement: SNF, or has home health Anticipated Discharge Location: pending ortho surg plan and recommendations  Dispo: Admit patient to Inpatient with expected length of stay greater than 2 midnights.  Signed: Charmayne Holmes, DO Internal Medicine Resident  09/16/2023, 7:14 PM  Please contact IM Residency On-Call Pager at: [phone] or [phone].

## 2023-09-16 NOTE — ED Notes (Signed)
 Patient transported to MRI

## 2023-09-16 NOTE — ED Provider Notes (Signed)
 [place] EMERGENCY DEPARTMENT AT [facility] Provider Note   CSN: [id number] Arrival date & time: 09/16/23  1228     Patient presents with: Wound Infection   Harold [name] is a 68 y.o. male.   HPI 68 year old male presents with right ankle wounds, pain and concern for infection. History is from EMS and patient. He lives at [facility] and the wound care nurse saw his wounds today and recommended he come to the hospital due to worsening wounds.  He states he has been having wounds and pain and swelling ever since being ambulatory on his ankle about 2 weeks ago.  No fevers or chills.  He is having worsening swelling and pain.  He is having bloody drainage.  Hydrocodone  is not helping his pain.  He is not currently on antibiotics.  He has had multiple prior surgeries due to previous hardware infections in the right ankle.  Prior to Admission medications   Medication Sig Start Date End Date Taking? Authorizing Provider  acetaminophen  (TYLENOL ) 500 MG tablet Take 1 tablet (500 mg total) by mouth every 6 (six) hours as needed for mild pain (pain score 1-3). 01/07/23   Hongalgi, Anand D, MD  allopurinol  (ZYLOPRIM ) 100 MG tablet Take 0.5 tablets (50 mg total) by mouth daily. 05/10/23   Vernon Ranks, MD  Amino Acids-Protein Hydrolys (PRO-STAT PO) Take 30 mLs by mouth 2 (two) times daily.    [provider]  amiodarone  (PACERONE ) 200 MG tablet Take 1 tablet (200 mg total) by mouth 2 (two) times daily. Patient not taking: Reported on 08/26/2023 05/10/23   Vernon Ranks, MD  apixaban  (ELIQUIS ) 5 MG TABS tablet TAKE 1 TABLET BY MOUTH TWICE A DAY 02/17/23   Anner Alm ORN, MD  atorvastatin  (LIPITOR) 40 MG tablet Take 1 tablet (40 mg total) by mouth daily. 05/10/23 03/02/24  Vernon Ranks, MD  clopidogrel  (PLAVIX ) 75 MG tablet Take 75 mg by mouth every morning. 02/04/23   [provider]  docusate sodium  (COLACE) 100 MG capsule Take 1 capsule (100 mg total) by mouth 2  (two) times daily. 06/28/23   Rosario Leatrice FERNS, MD  DULoxetine  (CYMBALTA ) 20 MG capsule Take 1 capsule (20 mg total) by mouth daily. 07/22/23   Wouk, Devaughn Sayres, MD  ezetimibe  (ZETIA ) 10 MG tablet Take 10 mg by mouth every evening. 05/09/22   [provider]  feeding supplement (ENSURE PLUS HIGH PROTEIN) LIQD Take 237 mLs by mouth 2 (two) times daily between meals. 08/09/23   Ezenduka, Nkeiruka J, MD  ferrous sulfate  325 (65 FE) MG tablet Take 325 mg by mouth 3 (three) times a week. Monday, Wednesday, Friday 06/08/23   [provider]  finasteride  (PROSCAR ) 5 MG tablet Take 5 mg by mouth daily.    [provider]  gabapentin  (NEURONTIN ) 300 MG capsule Take 1 capsule (300 mg total) by mouth at bedtime. 05/10/23   Vernon Ranks, MD  insulin  glargine (LANTUS  SOLOSTAR) 100 UNIT/ML Solostar Pen Inject 5 Units into the skin at bedtime. 07/22/23   Wouk, Devaughn Sayres, MD  insulin  lispro (HUMALOG) 100 UNIT/ML injection Inject 0-15 Units into the skin in the morning, at noon, in the evening, and at bedtime. Per sliding scale: 0-70= 0 units if <70 implement hypoglycemic protocol & notify MD/NP 71-150= 0 units 151-200=3 units 201-250= 6 units 251-300= 9 units 301-350= 12 units 351-400= 15 units 401-999 if CBG>400. Notify MD/ NP,    [provider]  levothyroxine  (SYNTHROID , LEVOTHROID) 88 MCG  tablet Take 88 mcg by mouth daily before breakfast.    [provider]  magnesium  oxide (MAG-OX) 400 MG tablet Take 1 tablet by mouth daily. 06/08/23   [provider]  melatonin 3 MG TABS tablet Take 3 mg by mouth at bedtime.    [provider]  methocarbamol  (ROBAXIN ) 500 MG tablet Take 1 tablet (500 mg total) by mouth every 6 (six) hours as needed for muscle spasms. 05/20/23   Danton Lauraine LABOR, PA-C  midodrine  (PROAMATINE ) 10 MG tablet Take 10 mg by mouth daily at 12 noon. Give 1 tablet by mouth in the afternoon related to Orthostatic HTN. Hold for SBP greater  than and notify provider.    [provider]  Multiple Vitamin (MULTIVITAMIN WITH MINERALS) TABS tablet Take 1 tablet by mouth daily. 01/08/23   Hongalgi, Anand D, MD  nitroGLYCERIN  (NITROSTAT ) 0.4 MG SL tablet PLACE 1 TAB UNDER THE TONGUE EVERY 5 MINUTES AS NEEDED FOR CHEST PAIN (SEVERE PRESSURE OR TIGHTNESS) 02/22/19   Anner Alm ORN, MD  pantoprazole  (PROTONIX ) 40 MG tablet TAKE 1 TABLET BY MOUTH EVERY DAY 12/13/22   Dunn, Dayna N, PA-C  polyethylene glycol (MIRALAX  / GLYCOLAX ) 17 g packet Take 17 g by mouth daily as needed for mild constipation. 06/28/23   Rosario Leatrice FERNS, MD  sertraline  (ZOLOFT ) 100 MG tablet Take 1 tablet (100 mg total) by mouth daily. 07/22/23   Wouk, Devaughn Sayres, MD  tirzepatide  (MOUNJARO ) 15 MG/0.5ML Pen Inject 15 mg into the skin once a week. 12/30/22     traZODone  (DESYREL ) 50 MG tablet Take 50 mg by mouth at bedtime.    [provider]  Vitamin D , Ergocalciferol , (DRISDOL ) 1.25 MG (50000 UNIT) CAPS capsule Take 1 capsule (50,000 Units total) by mouth every 7 (seven) days. 05/27/23   Lue Elsie BROCKS, MD    Allergies: Beta-lactamase inhibitors (beta-lactam), Other, Wellbutrin [bupropion], Metformin  and related, Sulfa antibiotics, and Tetracyclines & related    Review of Systems  Constitutional:  Negative for chills and fever.  Musculoskeletal:  Positive for arthralgias and joint swelling.  Skin:  Positive for wound.    Updated Vital Signs BP 131/62   Pulse 61   Temp 98.8 F (37.1 C) (Oral)   Resp 14   Ht 6' 1 (1.854 m)   Wt 102.5 kg   SpO2 100%   BMI 29.82 kg/m   Physical Exam Vitals and nursing note reviewed.  Constitutional:      Appearance: He is well-developed.  HENT:     Head: Normocephalic and atraumatic.  Cardiovascular:     Rate and Rhythm: Normal rate and regular rhythm.     Pulses:          Dorsalis pedis pulses are detected w/ Doppler on the right side.  Pulmonary:     Effort: Pulmonary effort is normal.   Musculoskeletal:     Right ankle: Swelling present. Tenderness present.     Comments: See picture.  On the medial aspect of his right ankle there is significant erythema as well as some swelling and wounds.  No current bleeding or drainage.  No crepitance.  Skin:    General: Skin is warm and dry.  Neurological:     Mental Status: He is alert.     (all labs ordered are listed, but only abnormal results are displayed) Labs Reviewed  COMPREHENSIVE METABOLIC PANEL WITH GFR - Abnormal; Notable for the following components:      Result Value   Sodium  134 (*)    Glucose, Bld 235 (*)    Creatinine, Ser 1.53 (*)    Calcium  8.6 (*)    Total Protein 5.3 (*)    Albumin  1.8 (*)    AST 14 (*)    GFR, Estimated 49 (*)    All other components within normal limits  CBC WITH DIFFERENTIAL/PLATELET - Abnormal; Notable for the following components:   RBC 2.76 (*)    Hemoglobin 8.1 (*)    HCT 25.0 (*)    All other components within normal limits  PROTIME-INR - Abnormal; Notable for the following components:   Prothrombin Time 18.7 (*)    INR 1.5 (*)    All other components within normal limits  CULTURE, BLOOD (ROUTINE X 2)  CULTURE, BLOOD (ROUTINE X 2)  I-STAT CG4 LACTIC ACID, ED    EKG: EKG Interpretation Date/Time:  Tuesday September 16 2023 12:54:35 EDT Ventricular Rate:  63 PR Interval:  192 QRS Duration:  124 QT Interval:  426 QTC Calculation: 437 R Axis:   71  Text Interpretation: Sinus rhythm Nonspecific intraventricular conduction delay Borderline repolarization abnormality similar to August 19 2023 Confirmed by Freddi Hamilton [phone]) on 09/16/2023 1:34:55 PM  Radiology: [facility] Ankle Complete Right Result Date: 09/16/2023 CLINICAL DATA:  Ankle infection. EXAM: RIGHT ANKLE - COMPLETE 3+ VIEW COMPARISON:  None Available. FINDINGS: Fixation rod traversing the calcaneus, talus, and tibia. There is old fracture of the distal fibula with callus formation. 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 There is diffuse skin thickening and edema. Vascular calcification noted. No soft tissue gas. IMPRESSION: 1. No acute fracture or dislocation. 2. Diffuse skin thickening and edema. Electronically Signed   By: Vanetta Chou M.D.   On: 09/16/2023 14:05     Procedures   Medications Ordered in the ED  cefTRIAXone  (ROCEPHIN ) 2 g in sodium chloride  0.9 % 100 mL IVPB (2 g Intravenous New Bag/Given 09/16/23 1532)    And  metroNIDAZOLE  (FLAGYL ) IVPB 500 mg (has no administration in time range)  vancomycin  (VANCOREADY) IVPB 2000 mg/400 mL (has no administration in time range)  HYDROmorphone  (DILAUDID ) injection 0.5 mg (0.5 mg Intravenous Given 09/16/23 1318)                                    Medical Decision Making Amount and/or Complexity of Data Reviewed Independent Historian: EMS External Data Reviewed: notes. Labs: ordered.    Details: Normal WBC and lactate Radiology: ordered and independent interpretation performed.    Details: No obvious hardware complication ECG/medicine tests: independent interpretation performed.    Details: No ischemia  Risk Prescription drug management. Decision regarding hospitalization.   Patient presents with what appears to be a right ankle infection.  He is high risk given his comorbidities such as diabetes but also due to his previous hardware infection requiring surgery.  Orthopedics, Ozell Purchase, was consulted and has seen patient.  Patient will be getting an MRI but will need admission and IV antibiotics for now.  Will likely need an ID consult.  Otherwise, patient does not appear to be septic.  Discussed with the internal medicine teaching service who will admit.     Final diagnoses:  Cellulitis of right ankle    ED Discharge Orders     None          Freddi Hamilton, MD 09/16/23 1556

## 2023-09-16 NOTE — ED Notes (Signed)
 Patient transported to X-ray

## 2023-09-17 DIAGNOSIS — E1142 Type 2 diabetes mellitus with diabetic polyneuropathy: Secondary | ICD-10-CM

## 2023-09-17 DIAGNOSIS — N1831 Chronic kidney disease, stage 3a: Secondary | ICD-10-CM

## 2023-09-17 LAB — GLUCOSE, CAPILLARY
Glucose-Capillary: 206 mg/dL — ABNORMAL HIGH (ref 70–99)
Glucose-Capillary: 166 mg/dL — ABNORMAL HIGH (ref 70–99)
Glucose-Capillary: 189 mg/dL — ABNORMAL HIGH (ref 70–99)
Glucose-Capillary: 165 mg/dL — ABNORMAL HIGH (ref 70–99)

## 2023-09-17 LAB — CBC
HCT: 22.4 % — ABNORMAL LOW (ref 39.0–52.0)
Hemoglobin: 7.3 g/dL — ABNORMAL LOW (ref 13.0–17.0)
MCH: 28.7 pg (ref 26.0–34.0)
MCHC: 32.6 g/dL (ref 30.0–36.0)
MCV: 88.2 fL (ref 80.0–100.0)
Platelets: 290 K/uL (ref 150–400)
RBC: 2.54 MIL/uL — ABNORMAL LOW (ref 4.22–5.81)
RDW: 15.5 % (ref 11.5–15.5)
WBC: 7 K/uL (ref 4.0–10.5)
nRBC: 0 % (ref 0.0–0.2)

## 2023-09-17 LAB — BASIC METABOLIC PANEL WITH GFR
Anion gap: 9 (ref 5–15)
BUN: 18 mg/dL (ref 8–23)
CO2: 21 mmol/L — ABNORMAL LOW (ref 22–32)
Calcium: 8.2 mg/dL — ABNORMAL LOW (ref 8.9–10.3)
Chloride: 104 mmol/L (ref 98–111)
Creatinine, Ser: 1.58 mg/dL — ABNORMAL HIGH (ref 0.61–1.24)
GFR, Estimated: 47 mL/min — ABNORMAL LOW (ref >60)
Glucose, Bld: 217 mg/dL — ABNORMAL HIGH (ref 70–99)
Potassium: 3.7 mmol/L (ref 3.5–5.1)
Sodium: 134 mmol/L — ABNORMAL LOW (ref 135–145)

## 2023-09-17 MED ORDER — ACETAMINOPHEN 650 MG RE SUPP: 650.0000 mg | Freq: Four times a day (QID) | RECTAL | Status: DC

## 2023-09-17 MED ORDER — SODIUM CHLORIDE 0.9 % IV SOLN
2.0000 g | Freq: Two times a day (BID) | INTRAVENOUS | Status: DC
  Administered 2023-09-17 – 2023-09-18 (×2): 2 g via INTRAVENOUS
  Filled 2023-09-17 (×2): qty 12.5

## 2023-09-17 MED ORDER — INSULIN GLARGINE-YFGN 100 UNIT/ML ~~LOC~~ SOLN
8.0000 [IU] | Freq: Every day | SUBCUTANEOUS | Status: DC
  Administered 2023-09-17 – 2023-09-21 (×5): 8 [IU] via SUBCUTANEOUS
  Filled 2023-09-17 (×6): qty 0.08

## 2023-09-17 MED ORDER — LOSARTAN POTASSIUM 50 MG PO TABS
25.0000 mg | ORAL_TABLET | Freq: Every day | ORAL | Status: DC
  Administered 2023-09-17 – 2023-09-22 (×6): 25 mg via ORAL
  Filled 2023-09-17 (×6): qty 1

## 2023-09-17 MED ORDER — ACETAMINOPHEN 500 MG PO TABS
1000.0000 mg | ORAL_TABLET | Freq: Four times a day (QID) | ORAL | Status: DC
  Administered 2023-09-17 – 2023-09-22 (×17): 1000 mg via ORAL
  Filled 2023-09-17 (×18): qty 2

## 2023-09-17 MED ORDER — LINEZOLID 600 MG/300ML IV SOLN
600.0000 mg | Freq: Two times a day (BID) | INTRAVENOUS | Status: DC
  Administered 2023-09-17 (×2): 600 mg via INTRAVENOUS
  Filled 2023-09-17 (×3): qty 300

## 2023-09-17 MED ORDER — HYDROMORPHONE HCL 1 MG/ML IJ SOLN
1.0000 mg | INTRAMUSCULAR | Status: DC | PRN
  Administered 2023-09-17 – 2023-09-22 (×19): 1 mg via INTRAVENOUS
  Filled 2023-09-17 (×19): qty 1

## 2023-09-17 MED ORDER — OXYCODONE HCL 5 MG PO TABS
5.0000 mg | ORAL_TABLET | Freq: Four times a day (QID) | ORAL | Status: DC | PRN
  Administered 2023-09-17 – 2023-09-21 (×11): 5 mg via ORAL
  Filled 2023-09-17 (×13): qty 1

## 2023-09-17 NOTE — Evaluation (Addendum)
 Physical Therapy Evaluation Patient Details Name: Harold [name] MRN: [id number] DOB: [birth year] Today's Date: 09/17/2023  History of Present Illness  Pt is a 68 y/o male admitted for R ankle infection, admitted from [facility]. 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 2. Fracture fragments of the posterior malleolus and medial malleolus noted. 3. Suspected septic tibiotalar, distal tibiofibular, and subtalar  joints. 4. Fluid-filled track through and along the medial posterior calcaneus. 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  Clinical Impression  Pt admitted with above. PTA, pt at [facility] rehab, and was recently ambulating short distances before increase in R ankle pain and swelling. Pt declining OOB mobility this session due to pain, but agreeable to bed level exercises with goal of maintaining his strength. Pt able to tolerate bed in chair position. Worked on BUE, BLE and core therapeutic exercises with good tolerance throughout. Patient will benefit from continued inpatient follow up therapy, <3 hours/day at d/c.       If plan is discharge home, recommend the following: Two people to help with walking and/or transfers;Two people to help with bathing/dressing/bathroom   Can travel by private vehicle   No    Equipment Recommendations None recommended by PT  Recommendations for Other Services       Functional Status Assessment Patient has had a recent decline in their functional status and demonstrates the ability to make significant improvements in function in a reasonable and predictable amount of time.     Precautions / Restrictions Precautions Precautions: Fall Recall of  Precautions/Restrictions: Intact Restrictions Weight Bearing Restrictions Per Provider Order: No      Mobility  Bed Mobility Overal bed mobility: Needs Assistance             General bed mobility comments: Bed placed in chair position    Transfers                   General transfer comment: deferred by patient    Ambulation/Gait                  Stairs            Wheelchair Mobility     Tilt Bed    Modified Rankin (Stroke Patients Only)       Balance                                             Pertinent Vitals/Pain Pain Assessment Pain Assessment: 0-10 Pain Score: 6  Pain Location: RLE, increases with WB or in between pain med doses Pain Descriptors / Indicators: Aching, Discomfort, Grimacing Pain Intervention(s): Limited activity within patient's tolerance, Monitored during session, RN gave pain meds during session    Home Living Family/patient expects to be discharged to:: Skilled nursing facility Living Arrangements: Spouse/significant other                 Additional Comments: From [facility], plans to return    Prior Function Prior Level of Function : Needs assist;History of Falls (last six months);Driving             Mobility Comments: Pt with history  of R ankle fx, recent ankle infection over last few weeks has limited WB through RLE ADLs Comments: help with LB ADLs/transfers, toileting,     Extremity/Trunk Assessment   Upper Extremity Assessment Upper Extremity Assessment: Overall WFL for tasks assessed    Lower Extremity Assessment Lower Extremity Assessment: RLE deficits/detail;LLE deficits/detail RLE Deficits / Details: Hip/knee grossly weak with quadriceps atrophy, 3-/5. Edematous ankle with kerlix donned, resting in increased external rotation. Able to wiggle toes LLE Deficits / Details: At least 3/5 strength    Cervical / Trunk Assessment Cervical / Trunk Assessment: Normal   Communication   Communication Communication: No apparent difficulties    Cognition Arousal: Alert Behavior During Therapy: WFL for tasks assessed/performed   PT - Cognitive impairments: No apparent impairments                         Following commands: Intact       Cueing Cueing Techniques: Verbal cues     General Comments      Exercises General Exercises - Lower Extremity Quad Sets: Both, 10 reps, Supine Long Arc Quad: Both, 10 reps, Other (comment) (bed in chair position) Heel Slides: Both, 10 reps, Supine Hip ABduction/ADduction: Both, 10 reps, Supine Straight Leg Raises: Both, 10 reps, Supine Other Exercises Other Exercises: Supine: L single leg bridge x 10 Other Exercises: Bed in chair position: pillow punches with BUE's x 2 minutes, modified pull ups with bed rails x 10   Assessment/Plan    PT Assessment Patient needs continued PT services  PT Problem List Decreased strength;Decreased range of motion;Decreased activity tolerance;Decreased mobility;Pain       PT Treatment Interventions Functional mobility training;Therapeutic activities;Therapeutic exercise;Balance training;Patient/family education;Wheelchair mobility training    PT Goals (Current goals can be found in the Care Plan section)  Acute Rehab PT Goals Patient Stated Goal: less pain, maintain strength PT Goal Formulation: With patient Time For Goal Achievement: 10/01/23 Potential to Achieve Goals: Good    Frequency Min 2X/week     Co-evaluation               AM-PAC PT 6 Clicks Mobility  Outcome Measure Help needed turning from your back to your side while in a flat bed without using bedrails?: A Little Help needed moving from lying on your back to sitting on the side of a flat bed without using bedrails?: A Little Help needed moving to and from a bed to a chair (including a wheelchair)?: A Lot Help needed standing up from a chair using your arms (e.g., wheelchair or  bedside chair)?: A Lot Help needed to walk in hospital room?: Total Help needed climbing 3-5 steps with a railing? : Total 6 Click Score: 12    End of Session   Activity Tolerance: Patient tolerated treatment well Patient left: in bed;with call bell/phone within reach Nurse Communication: Mobility status PT Visit Diagnosis: Pain;Other abnormalities of gait and mobility (R26.89) Pain - Right/Left: Right Pain - part of body: Ankle and joints of foot    Time: 8780-8755 PT Time Calculation (min) (ACUTE ONLY): 25 min   Charges:   PT Evaluation $PT Eval Low Complexity: 1 Low PT Treatments $Therapeutic Exercise: 8-22 mins PT General Charges $$ ACUTE PT VISIT: 1 Visit         Aleck Daring, PT, DPT Acute Rehabilitation Services Office [phone]   Alayne ONEIDA Daring 09/17/2023, 2:26 PM

## 2023-09-17 NOTE — Evaluation (Signed)
 Occupational Therapy Evaluation Patient Details Name: Harold [name] MRN: [id number] DOB: 06-Jul-1955 Today's Date: 09/17/2023   History of Present Illness   Pt is a 68 y/o male admitted for R ankle infection, admitted from [facility], .  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     Clinical Impressions Pt c/o pain to RLE, 6/10, sharply increases with movement, reports 10/10 when meds wear off. Pt from [facility] for ongoing R ankle management with history of fx, difficulty with wound healing. PLOF Pt reports ambulating short distances until ankle infection started a couple weeks ago. Pt currently at bed level for ADLs, refused sitting EOB due to pain when R ankle is down against gravity. Pt able to sit up in bed using B hands for support, good overall BUE strength and ROM, good LLE ROM. Pt expected to improve quickly once pain to RLE is managed. Recommending return to postacute rehab <3hrs/day, will continue to see acutely to progress as able as pain allows.      If plan is discharge home, recommend the following:   A lot of help with walking and/or transfers;A lot of help with bathing/dressing/bathroom;Assistance with cooking/housework;Assist for transportation;Help with stairs or ramp for entrance     Functional Status Assessment   Patient has had a recent decline in their functional status and demonstrates the ability to make significant improvements in function in a reasonable and predictable amount of time.     Equipment Recommendations   Other (comment) (defer)     Recommendations for Other Services         Precautions/Restrictions   Precautions Precautions: Fall Recall of Precautions/Restrictions: Intact Restrictions Weight Bearing Restrictions Per  Provider Order: No     Mobility Bed Mobility Overal bed mobility: Needs Assistance Bed Mobility: Rolling Rolling: Min assist         General bed mobility comments: min A rolling L/R, limited due to pain in RLE    Transfers                   General transfer comment: declined due to pain      Balance Overall balance assessment: Needs assistance Sitting-balance support: Feet supported, Bilateral upper extremity supported Sitting balance-Leahy Scale: Fair Sitting balance - Comments: sitting up at bed level, using B hands for support, fair overall core strength and balance       Standing balance comment: declined due to pain                           ADL either performed or assessed with clinical judgement   ADL Overall ADL's : Needs assistance/impaired Eating/Feeding: Independent   Grooming: Set up;Bed level   Upper Body Bathing: Minimal assistance;Bed level   Lower Body Bathing: Total assistance;Bed level   Upper Body Dressing : Minimal assistance;Bed level   Lower Body Dressing: Total assistance;Bed level       Toileting- Clothing Manipulation and Hygiene: Total assistance;Bed level         General ADL Comments: Pt at bed level due to pain in RLE, increases when sitting EOB. Pt reports being at bed level last 2 weeks for all ADLs. Good BUE strength and ROM, once pain is managed will likely quickly improve     Vision Baseline Vision/History:  1 Wears glasses Ability to See in Adequate Light: 0 Adequate Patient Visual Report: No change from baseline       Perception         Praxis         Pertinent Vitals/Pain Pain Assessment Pain Assessment: 0-10 Pain Score: 6  Pain Location: RLE, increases with WB or in between pain med doses Pain Descriptors / Indicators: Aching, Discomfort, Grimacing Pain Intervention(s): Monitored during session     Extremity/Trunk Assessment Upper Extremity Assessment Upper Extremity Assessment:  Overall WFL for tasks assessed           Communication Communication Communication: No apparent difficulties   Cognition Arousal: Alert Behavior During Therapy: WFL for tasks assessed/performed Cognition: No apparent impairments                               Following commands: Intact       Cueing  General Comments   Cueing Techniques: Verbal cues      Exercises     Shoulder Instructions      Home Living Family/patient expects to be discharged to:: Skilled nursing facility Living Arrangements: Spouse/significant other                               Additional Comments: From [facility], plans to return      Prior Functioning/Environment Prior Level of Function : Needs assist;History of Falls (last six months);Driving             Mobility Comments: Pt with history of R ankle fx, recent ankle infection over last few weeks has limited WB through RLE ADLs Comments: help with LB ADLs/transfers, toileting,    OT Problem List: Decreased strength;Decreased activity tolerance;Impaired balance (sitting and/or standing);Pain;Obesity   OT Treatment/Interventions: Self-care/ADL training;Therapeutic exercise;Energy conservation;DME and/or AE instruction;Therapeutic activities;Patient/family education;Balance training      OT Goals(Current goals can be found in the care plan section)   Acute Rehab OT Goals Patient Stated Goal: top manage pain OT Goal Formulation: With patient Time For Goal Achievement: 10/01/23 Potential to Achieve Goals: Fair   OT Frequency:  Min 2X/week    Co-evaluation              AM-PAC OT 6 Clicks Daily Activity     Outcome Measure Help from another person eating meals?: None Help from another person taking care of personal grooming?: A Little Help from another person toileting, which includes using toliet, bedpan, or urinal?: Total Help from another person bathing (including washing, rinsing, drying)?:  A Lot Help from another person to put on and taking off regular upper body clothing?: A Little Help from another person to put on and taking off regular lower body clothing?: Total 6 Click Score: 14   End of Session Nurse Communication: Mobility status  Activity Tolerance: Patient limited by pain Patient left: in bed;with call bell/phone within reach  OT Visit Diagnosis: Unsteadiness on feet (R26.81);Other abnormalities of gait and mobility (R26.89);Muscle weakness (generalized) (M62.81);Pain Pain - Right/Left: Right Pain - part of body: Ankle and joints of foot                Time: 1255-1313 OT Time Calculation (min): 18 min Charges:  OT General Charges $OT Visit: 1 Visit OT Evaluation $OT Eval Moderate Complexity: 1 [address], OTR/L   Elouise JONELLE Bott 09/17/2023, 1:19 PM

## 2023-09-17 NOTE — Progress Notes (Signed)
   09/17/23 2243  BiPAP/CPAP/SIPAP  $ Non-Invasive Home Ventilator  Initial  $ Face Mask Medium Yes  BiPAP/CPAP/SIPAP Pt Type Adult  BiPAP/CPAP/SIPAP Resmed  Mask Type Full face mask  Mask Size Medium  PEEP 5 cmH20  Patient Home Machine No  Patient Home Mask No  Patient Home Tubing No  Auto Titrate No  Device Plugged into [employer] Power Outlet Yes

## 2023-09-17 NOTE — Progress Notes (Signed)
 HD#1 SUBJECTIVE:  Patient Summary: Harold [name] is a 68 y.o. 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    Overnight Events: pain regimen started: acetaminophen  1000 mg q6hr, oxycodone  5 mg q6hr prn, hydromorphone  1 mg q4hr prn. Told RT he doesn't need CPAP as he can elevate the bed.   Interim History: Patient comfortable eating his breakfast. His ankle pain is improved to 10/10 and hurts mostly with movement. We discussed that his MRI results did show concern for osteomyelitis and infected joints. We discussed that IM team will reach out to surgery about antibiotics and to learn their plan for surgery.   He denies sweating and does feel warm but admits he is under several blankets. Denies palpitations, SOB, dysuria, abdominal pain.   OBJECTIVE:  Vital Signs: Vitals:   09/16/23 2039 09/16/23 2314 09/17/23 0439 09/17/23 0758  BP: 125/60 (!) 125/57 (!) 144/79 (!) 158/75  Pulse: 68 60 68 65  Resp:  19 20 18   Temp: 97.8 F (36.6 C) 97.7 F (36.5 C) 97.7 F (36.5 C) 97.9 F (36.6 C)  TempSrc: Oral Oral Oral Oral  SpO2: 100% 98% 97% 98%  Weight:      Height:       Supplemental O2: Room Air SpO2: 98 %  Filed Weights   09/16/23 1234  Weight: 102.5 kg     Intake/Output Summary (Last 24 hours) at 09/17/2023 9188 Last data filed at 09/17/2023 0600 Gross per 24 hour  Intake 300 ml  Output 400 ml  Net -100 ml   Net IO Since Admission: -100 mL [09/17/23 0811]  Physical Exam: Physical Exam Constitutional:      Appearance: He is not ill-appearing, toxic-appearing or diaphoretic.  HENT:     Nose: No congestion or rhinorrhea.     Mouth/Throat:     Mouth: Mucous membranes are moist.  Eyes:     Extraocular Movements: Extraocular movements  intact.     Conjunctiva/sclera: Conjunctivae normal.  Pulmonary:     Effort: Pulmonary effort is normal.     Breath sounds: Normal breath sounds.  Abdominal:     General: Bowel sounds are normal.     Palpations: Abdomen is soft.  Skin:    General: Skin is warm and dry.  Neurological:     General: No focal deficit present.     Mental Status: He is alert and oriented to person, place, and time.  Psychiatric:        Mood and Affect: Mood normal.     Patient Lines/Drains/Airways Status     Active Line/Drains/Airways     Name Placement date Placement time Site Days   Peripheral IV 09/16/23 20 G Left Antecubital 09/16/23  1252  Antecubital  1   External Urinary Catheter 09/16/23  2100  --  1   Pressure Injury 06/20/23 Sacrum Right Unstageable - Full thickness tissue loss in which the base of the injury is covered by slough (yellow, tan, gray, green or brown) and/or eschar (tan, brown or black) in the wound bed. 06/20/23  0050  -- 89   Wound / Incision (Open or Dehisced) 06/20/23 Incision - Open;Other (Comment) Ankle Lateral;Right 06/20/23  0045  Ankle  89   Wound / Incision (Open or Dehisced) 07/18/23 Arm Left;Lower;Posterior 07/18/23  0524  Arm  61   Wound / Incision (Open or Dehisced) 07/30/23 Non-pressure wound Heel Right 07/30/23  1540  Heel  49   Wound / Incision (Open or Dehisced) 07/30/23 Foot Right;Posterior 07/30/23  1540  Foot  49            Pertinent labs and imaging:      Latest Ref Rng & Units 09/17/2023    5:12 AM 09/16/2023   12:45 PM 08/02/2023    2:44 AM  CBC  WBC 4.0 - 10.5 K/uL 7.0  7.6  5.6   Hemoglobin 13.0 - 17.0 g/dL 7.3  8.1  89.3   Hematocrit 39.0 - 52.0 % 22.4  25.0  32.1   Platelets 150 - 400 K/uL 290  323  204        Latest Ref Rng & Units 09/17/2023    5:12 AM 09/16/2023   12:45 PM 08/08/2023    3:18 AM  CMP  Glucose 70 - 99 mg/dL 782  764  843   BUN 8 - 23 mg/dL 18  19  11    Creatinine 0.61 - 1.24 mg/dL 8.41  8.46  8.06   Sodium 135 - 145  mmol/L 134  134  139   Potassium 3.5 - 5.1 mmol/L 3.7  4.2  3.4   Chloride 98 - 111 mmol/L 104  103  111   CO2 22 - 32 mmol/L 21  22  21    Calcium  8.9 - 10.3 mg/dL 8.2  8.6  8.0   Total Protein 6.5 - 8.1 g/dL  5.3    Total Bilirubin 0.0 - 1.2 mg/dL  0.4    Alkaline Phos 38 - 126 U/L  91    AST 15 - 41 U/L  14    ALT 0 - 44 U/L  11      MR FOOT RIGHT W WO CONTRAST Result Date: 09/17/2023 CLINICAL DATA:  Soft tissue infection EXAM: MRI OF THE RIGHT FOOT WITHOUT AND WITH CONTRAST TECHNIQUE: Multiplanar, multisequence MR imaging of the right foot from the midfoot through the toes was performed before and after the administration of intravenous contrast. CONTRAST:  10mL GADAVIST  GADOBUTROL  1 MMOL/ML IV SOLN COMPARISON:  06/19/2023 FINDINGS: Bones/Joint/Cartilage Please see dedicated MRI ankle for findings in the hindfoot and proximal midfoot. Spurring and degenerative subcortical cyst formation along the first MTP joint and the articulation of first metatarsal head with the sesamoids. No findings of forefoot osteomyelitis. Ligaments Lisfranc ligament intact. Muscles and Tendons Low-grade diffuse muscular edema potentially from neurogenic edema or generalized third spacing of fluid. No intramuscular abscess. Soft tissues Extensive dorsal subcutaneous edema in the foot with some enhancement, cellulitis is a distinct possibility. This edema tracks mildly into the toes. No drainable soft tissue abscess identified. IMPRESSION: 1. Extensive dorsal subcutaneous edema in the forefoot foot with some enhancement, cellulitis is a distinct possibility. No drainable soft tissue abscess identified. 2. No findings of forefoot osteomyelitis. 3. Low-grade diffuse muscular edema potentially from neurogenic edema or generalized third spacing of fluid. 4. Degenerative spurring and subcortical cyst formation along the first MTP joint and the articulation of first metatarsal head with the sesamoids. Electronically Signed   By:  Ryan Salvage M.D.   On: 09/17/2023 08:05   MR ANKLE RIGHT W WO CONTRAST Result Date: 09/17/2023 CLINICAL DATA:  Soft tissue infection EXAM: MRI OF THE RIGHT ANKLE WITHOUT AND WITH CONTRAST TECHNIQUE: Multiplanar, multisequence MR imaging of the ankle was performed before and after the administration of intravenous contrast.  CONTRAST:  10mL GADAVIST  GADOBUTROL  1 MMOL/ML IV SOLN COMPARISON:  Radiographs 09/16/2023 FINDINGS: TENDONS Peroneal: Common peroneus tendon sheath tenosynovitis. Moderate peroneus brevis peroneus longus tendinopathy Posteromedial: Substantial distal tibialis posterior tendinopathy. Indistinctness of the tibialis posterior and possibly partial tearing in the vicinity of what appears to be a draining sinus tract from the tibiotalar joint to the cutaneous surface posteromedially. Anterior: Mild distal tibialis anterior tendinopathy. Achilles: Mild distal Achilles tendinopathy. Plantar Fascia: Thickened medial band of the plantar fascia suspicious for plantar fasciitis. LIGAMENTS Lateral: Thickening and some indistinctness of portions the anterior talofibular ligament, query prior injury. Medial: Edema within the deep tibiotalar portion of the deltoid ligament, compatible with sprain or partial tear. CARTILAGE Ankle Joint: IM nail extends through the calcaneus, talus, and into the tibia. Fluid density surrounds this IM nail and there is an effusion of the tibiotalar joint with what appears to be a draining sinus tract from the tibiotalar joint extending posterior to the medial malleolus and anterior to the tibialis posterior tendon to the cutaneous surface as shown on images 11 through 13 series 4. Synovitis, marrow edema, bony destructive findings observed suspicious for osteomyelitis. There appears to be a posterior malleolar fragment suggesting prior posterior malleolar fracture. Abnormal edema and erosions of the talus noted. Subtalar Joints/Sinus Tarsi: The IM nail traverses the  subtalar joint with abnormal edema in the calcaneus and talus, suspicion for infection around the IM nail, and effusion of the subtalar joints which are likely septic. Bones: As noted above there is edema in the distal tibia, talus, calcaneus suspicious for osteomyelitis with lucency surrounding the IM nail. Fluid-filled track through and along the medial posterior calcaneus on image 26 series 4, possibly from prior hardware placement but a draining sinus tract is not excluded. Suspected septic tibiotalar, distal tibiofibular, and subtalar joints. Marrow edema in the navicular could represent early osteomyelitis. Prior screw tracks in the calcaneus likely from remote external fixator. Other prior hardware tracks in the calcaneus noted posteriorly. Disruption of the subcutaneous tissues colinear with the IM nail noted along the plantar foot, probably incidental although infection in this region is not excluded. Separate fragments of the posterior malleolus and medial malleolus. Other: Extensive subcutaneous edema in the ankle suspicious for cellulitis. IMPRESSION: IMPRESSION 1. Findings are suspicious for osteomyelitis of the distal tibia, talus, and calcaneus, with lucency surrounding the IM nail. 2. Fracture fragments of the posterior malleolus and medial malleolus noted. 3. Suspected septic tibiotalar, distal tibiofibular, and subtalar joints. 4. Fluid-filled track through and along the medial posterior calcaneus, possibly from prior hardware placement but a draining sinus tract is not excluded. 5. Disruption of the subcutaneous tissues colinear with the IM nail along the plantar foot, probably incidental although infection in this region is not excluded. 6. Extensive subcutaneous edema in the ankle suspicious for cellulitis. 7. Substantial distal tibialis posterior tendinopathy with indistinctness and possibly partial tearing in the vicinity of what appears to be a draining sinus tract from the tibiotalar joint  to the cutaneous surface. 8. Common peroneus tendon sheath tenosynovitis. Moderate peroneus brevis peroneus longus tendinopathy. 9. Mild distal tibialis anterior tendinopathy. 10. Mild distal Achilles tendinopathy. 11. Thickened medial band of the plantar fascia suspicious for plantar fasciitis. 12. Thickening and indistinctness of portions of the anterior talofibular ligament, query prior injury. 13. Edema within the deep tibiotalar portion of the deltoid ligament, compatible with sprain or partial tear. Electronically Signed   By: Ryan Salvage M.D.   On: 09/17/2023 08:01   DG Ankle  Complete Right Result Date: 09/16/2023 CLINICAL DATA:  Ankle infection. EXAM: RIGHT ANKLE - COMPLETE 3+ VIEW COMPARISON:  None Available. FINDINGS: Fixation rod traversing the calcaneus, talus, and tibia. There is old fracture of the distal fibula with callus formation. 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 There is diffuse skin thickening and edema. Vascular calcification noted. No soft tissue gas. IMPRESSION: 1. No acute fracture or dislocation. 2. Diffuse skin thickening and edema. Electronically Signed   By: Vanetta Chou M.D.   On: 09/16/2023 14:05    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 [facility]) Active Problems:   Diabetes mellitus type 2 with neurological manifestations (HCC)   Essential hypertension   Neuropathy   Hyperlipidemia associated with type 2 diabetes mellitus (HCC)   CAD, LAD/CFX DES 04/28/12- staged RCA DES 04/29/12 after pt declined CABG, cath 05/14/13 stable CAD medical therapy   COPD (chronic obstructive pulmonary disease) (HCC)   Chronic heart failure with preserved ejection fraction (HFpEF) (HCC)   Gastroesophageal reflux disease without esophagitis   PVD (peripheral vascular disease) (HCC)   CKD stage 3b, GFR 30-44 ml/min (HCC)   Normocytic anemia   PAD (peripheral artery disease) (HCC)   Type 2 diabetes  mellitus with diabetic nephropathy, with long-term current use of insulin  (HCC)   Stage 3a chronic kidney disease (HCC)   Plan: #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  -Hardware removed by Dr. Kendal 06/21/2023. Hospital discharge on 07/22/23 with chronic septic joint completed ertapenem  and daptomycin  on 07/31/2023.  -Right digits are swollen, weeping skins lesions needing repeated dressing changes.  -MRI 7/29: Suspicious for OM in distal tibia, talus, and calcaneus. Suspected septic tibiotalar, distal tibiofibular, and subtalar joints. -WBC wnl and patient afebrile 7/30 -pending 7/29 blood cultures, no growth   -Continue linezolid  and change to cefepime  (7/30). Will reevaluate abx plan with ortho surg  -holding anticoagulation and NPO at midnight pending ortho surg tentative plans, appreciate recommendations -tylenol , oxycodone , and hydromorphone  orders in for pain control    #Peripheral Vascular Disease -Follows with Dr. Darron who in Aug 2021 did successful balloon angioplasty to tibioperoneal trunk in posterior tibial artery with drug-coated angioplasty. In sept 2024, patient had reccurent wounds on left leg which were healed after Dr. Darron did successful orbital atherectomy and drug-coated balloon angioplasty to left popliteal artery, TP trunk and posterior tibial artery.  -After fall in Nov 2024, Dr. Serene did Angiogram which showed calcified stenosis in distal right SFA, treated with Eluvia drug-eluting stent.  -OT and PT ordered 7/30 --holding Plavix  pending ortho surg plan   #Qtc prolongation #Paroxysmal atrial fibrillation  -Afib diagnosed in 2023, refractory to [facility] on 03/13/22 and 03/27/22, Dr. Nancey transitioned to medical therapy starting  amio -Hospitalization in June 2025, patient had Qtc prolongation (likely due to polypharmacy with prolong drugs) treated with amiodarone  with recommendations to avoid further Qtc prolong medications -Continue home meds: amiodarone  200 mg twice daily, apixaban  5 mg twice daily, Coreg  twice daily   #Diabetes Mellitus Type 2 -glucose 235 on admission, 217 today  -goal sugars <180 for wound healing optimization -sliding scale   -Semglee  8 units daily (increased from 5 units on 7/30)   #Chronic Kidney Disease Stage 3b -patient denies symptoms concerning for infection, has been urinating normally  -has had dialysis  in the past but not currently  -7/29 on admission Cr 1.53, BUN 19 -7/30 Cr 1.58, BUN 18 -monitor electrolytes and urine output   #Coronary artery disease s/p CABG -home meds: apixaban  5 mg twice daily, Coreg  twice daily -holding anticoagulation pending ortho surg plan -Holding plavix  -Resume home Lipitor 40 mg    #Chronic anemia -7/29 on admission hgb 8.1, 7.3 today -anticipate some blood loss if he is going to have surgery -monitor CBC closely    #Chronic HTN -Carvedilol  discontinued in July due to concern for orthostatic hypotension -160/68 on admission 7/29, patient in pain. Will monitor  -Continue home losartan  25 mg    #OSA on CPAP -continue CPAP   #GAD #Depression -duloxetine  20 mg -trazodone  50 mg   #BPH -finasteride  5 mg   #GERD -continue pantoprazole  40 mg tablet   #COPD   #Hypothyroidism -continue home levothyroxine  88 mcg   Best Practice: Diet: Regular diet VTE: enoxaparin  (LOVENOX ) injection 40 mg Start: 09/16/23 2200 Code: Full  Disposition planning: Therapy Recs: Pending, Family Contact: Spouse, to be notified. DISPO: Anticipated discharge, pending surgery  Signature:  Viktoria Charmayne [facility]  8:11 AM, 09/17/2023  On Call pager [phone]

## 2023-09-17 NOTE — Inpatient Diabetes Management (Signed)
 Inpatient Diabetes Program Recommendations  AACE/ADA: New Consensus Statement on Inpatient Glycemic Control (2015)  Target Ranges:  Prepandial:   less than 140 mg/dL      Peak postprandial:   less than 180 mg/dL (1-2 hours)      Critically ill patients:  140 - 180 mg/dL   Lab Results  Component Value Date   GLUCAP 206 (H) 09/17/2023   HGBA1C 5.2 07/18/2023    Review of Glycemic Control  Latest Reference Range & Units 09/16/23 20:37 09/17/23 08:00  Glucose-Capillary 70 - 99 mg/dL 797 (H) 793 (H)   Diabetes history: DM 2 Outpatient Diabetes medications: Mounjaro  15 mg Weekly, Humalog 0-15 units 4x/day, lantus  5 units qhs Current orders for Inpatient glycemic control:  Semglee  5 units Novolog  0-15 units tid + hs A1c 5.2% on 5/30 Renal function slightly elevated creat 1.5  Inpatient Diabetes Program Recommendations:    -   may consider increasing Semglee  to 7 units  Thanks, Clotilda Bull RN, MSN, BC-ADM Inpatient Diabetes Coordinator Team Pager [phone] (8a-5p)

## 2023-09-17 NOTE — Plan of Care (Signed)

## 2023-09-17 NOTE — TOC Initial Note (Addendum)
 Transition of Care [facility]) - Initial/Assessment Note    Patient Details  Name: Harold [name] MRN: [id number] Date of Birth: 1955/07/06  Transition of Care [facility]) CM/SW Contact:    Lauraine FORBES Saa, LCSW Phone Number: 09/17/2023, 4:26 PM  Clinical Narrative:                 4:26 PM CSW introduced self and role to patient. Patient's spouse, Harold [name], was also present. Patient consented CSW to speak to and with Harold [name]. Patient confirmed he is from [facility] and is interested in returning upon discharge (per SNF, has used 40 days). Patient confirmed he is active with [facility] and has DME but expressed interest in non-bariatric DME. CSW suggested patient and Harold [name] to follow up with SNF or HH. Patient confirmed SDOH (transportation) resources. CSW provided transportation resources and [employer] [place] application. Patient informed CSW that he is ineligible for Medicaid due to income. Harold [name] confirmed patient has a virtual PCP through [employer]. [facility]'s [phone] [place]. CSW will continue to follow and be available to assist.  Expected Discharge Plan: Skilled Nursing Facility Barriers to Discharge: Continued Medical Work up   Patient Goals and CMS Choice Patient states their goals for this hospitalization and ongoing recovery are:: to return to SNF STR          Expected Discharge Plan and Services In-house Referral: Clinical Social Work   Post Acute Care Choice: Skilled Nursing Facility Living arrangements for the past 2 months: Single Family Home, Skilled Nursing Facility                   DME Agency:  Librarian, academic)         HH Agency: [facility]        Prior Living Arrangements/Services Living arrangements for the past 2 months: Single Family Home, Skilled Nursing Facility Lives with:: Facility Resident, Spouse Patient language and need for interpreter reviewed:: Yes Do you feel safe going back to the place where you live?:  Yes      Need for Family Participation in Patient Care: No (Comment) Care giver support system in place?: Yes (comment) Current home services: DME, Home OT, Home PT, Home RN Criminal Activity/Legal Involvement Pertinent to Current Situation/Hospitalization: No - Comment as needed  Activities of Daily Living   ADL Screening (condition at time of admission) Independently performs ADLs?: No Does the patient have a NEW difficulty with bathing/dressing/toileting/self-feeding that is expected to last >3 days?: Yes (Initiates electronic notice to provider for possible OT consult) Does the patient have a NEW difficulty with getting in/out of bed, walking, or climbing stairs that is expected to last >3 days?: Yes (Initiates electronic notice to provider for possible PT consult) Does the patient have a NEW difficulty with communication that is expected to last >3 days?: No Is the patient deaf or have difficulty hearing?: No Does the patient have difficulty seeing, even when wearing glasses/contacts?: No Does the patient have difficulty concentrating, remembering, or making decisions?: No  Permission Sought/Granted Permission sought to share information with : Family Supports, Oceanographer granted to share information with : Yes, Verbal Permission Granted  Share Information with NAME: Harold [name]  Permission granted to share info w AGENCY: [facility]  Permission granted to share info w Relationship: Spouse  Permission granted to share info w Contact Information: [phone]  Emotional Assessment Appearance:: Appears stated age Attitude/Demeanor/Rapport: Engaged Affect (typically observed): Accepting, Appropriate, Adaptable, Calm, Stable, Pleasant  Orientation: : Oriented to Self, Oriented to Place, Oriented to  Time, Oriented to Situation Alcohol / Substance Use: Not Applicable Psych Involvement: No (comment)  Admission diagnosis:  Cellulitis of  right ankle [L03.115] Septic arthritis of ankle and foot region [facility]) [M00.9] Patient Active Problem List   Diagnosis Date Noted   Type 2 diabetes mellitus with diabetic polyneuropathy, with long-term current use of insulin  (HCC) 09/17/2023   Septic arthritis of ankle and foot region (HCC) 09/16/2023   Type 2 diabetes mellitus with diabetic nephropathy, with long-term current use of insulin  (HCC) 09/16/2023   Stage 3a chronic kidney disease (HCC) 09/16/2023   Hypokalemia 07/30/2023   History of COPD 07/30/2023   History of peripheral vascular disease 07/30/2023   Non-insulin  dependent type 2 diabetes mellitus (HCC) 07/30/2023   BPH (benign prostatic hyperplasia) 07/30/2023   Major depressive disorder, recurrent episode, moderate (HCC) 07/19/2023   PICC (peripherally inserted central catheter) in place 07/18/2023   Dizziness 07/17/2023   Hypotension 07/17/2023   Infection of lower extremity associated with hardware (HCC) 06/19/2023   Hyponatremia 06/19/2023   Open wound of right ankle 06/18/2023   Protein-calorie malnutrition, severe 05/21/2023   Open right ankle fracture, sequela 05/12/2023   Obesity, Class II, BMI 35-39.9 04/23/2023   Osteomyelitis (HCC) 04/17/2023   Cellulitis of right lower extremity 03/28/2023   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, right (HCC) 03/28/2023   Abscess of skin of right ankle 03/28/2023   PAD (peripheral artery disease) (HCC) 03/28/2023   Vitamin D  deficiency 01/07/2023   QT prolongation 01/02/2023   Acute renal failure superimposed on stage 3b chronic kidney disease (HCC) 01/02/2023   Normocytic anemia 01/02/2023   Paroxysmal atrial fibrillation (HCC) 01/02/2023   Uncontrolled type 2 diabetes mellitus with hyperglycemia, with long-term current use of insulin  (HCC) 01/02/2023   Hypothyroidism 01/02/2023   CKD stage 3b, GFR 30-44 ml/min (HCC) 11/07/2022   CAD in native artery 11/07/2022   PVD (peripheral vascular disease) (HCC) 11/06/2022    Hypercoagulable state due to persistent atrial fibrillation (HCC) 02/19/2022   Persistent atrial fibrillation (HCC) 01/28/2022   Lumbar stenosis with neurogenic claudication 07/26/2020   Diabetic neuropathy (HCC) 08/24/2019   Trigger thumb of left hand 09/02/2018   Atherosclerosis of native artery of both lower extremities with intermittent claudication (HCC) 05/12/2018   Pain of left hand 03/26/2018   Trigger finger 03/03/2017   Pain in finger of left hand 03/03/2017   Snoring 08/20/2016   Morbid (severe) obesity due to excess calories (HCC) 04/18/2016   Venous stasis of both lower extremities - with edema 02/25/2015   Right-sided chest wall pain 05/08/2014   Gastroesophageal reflux disease without esophagitis 10/10/2013   Chronic heart failure with preserved ejection fraction (HFpEF) (HCC) 06/27/2013   COPD (chronic obstructive pulmonary disease) (HCC) 06/17/2013   Restrictive lung disease 06/17/2013   Obesity, Class III, BMI 40-49.9 (morbid obesity) 09/01/2012   Generalized weakness 09/01/2012   Chronic renal disease, stage IV (HCC) 04/30/2012   OSA on CPAP 04/29/2012   Pulmonary hypertension, 04/29/2012   Dyspnea on exertion   04/26/2012   CAD, LAD/CFX DES 04/28/12- staged RCA DES 04/29/12 after pt declined CABG, cath 05/14/13 stable CAD medical therapy    Carotid artery stenosis without cerebral infarction, bilateral 11/07/2011   Diabetes mellitus type 2 with neurological manifestations (HCC) 04/29/2011   Essential hypertension 04/29/2011   Neuropathy 04/29/2011   Hyperlipidemia associated with type 2 diabetes mellitus (HCC) 04/29/2011   Anxiety and depression 04/29/2011  PCP:  Patient, No Pcp Per Pharmacy:   [id number] [address] [employer] - [address] [place] [address] Phone: [phone] Fax: [phone]     Social Drivers of Health (SDOH) Social History: SDOH Screenings   Food Insecurity: No Food Insecurity (09/16/2023)   Housing: Low Risk  (09/16/2023)  Recent Concern: Housing - High Risk (07/31/2023)  Transportation Needs: Unmet Transportation Needs (09/16/2023)  Utilities: Not At Risk (09/16/2023)  Depression (PHQ2-9): Low Risk  (07/29/2023)  Social Connections: Moderately Isolated (09/16/2023)  Tobacco Use: Medium Risk (07/30/2023)   SDOH Interventions: Transportation Interventions: [employer] Provided, SCAT ([employer]), Inpatient TOC   Readmission Risk Interventions     No data to display

## 2023-09-17 NOTE — Progress Notes (Signed)
 Ortho Note  Patient known to me and I have reviewed his imaging as well as his MRI.  He is presenting with new onset infection.  He has had issues with infection previously I had removed the distal interlocking screws with a debridement in May of this year.  He had developed worsening pain over the last 2 weeks with eventually wound drainage and blisters that developed likely from underlying infection.  Unfortunately his MRI does show that he has significant infection around the nail that extends to those blisters that are superficial and it does extend to the ankle and subtalar joint as well.  I had an extensive discussion with the patient and his husband at bedside today.  I presented 3 options to them.  Suppression antibiotics with IV with transition to p.o. antibiotics with retention of the hardware.  I discussed that his infection burden might not make this feasible however think it is reasonable to see how his foot improves on IV antibiotics over the next couple days.  There is a high risk of needing surgical intervention. Debridement with removal of the TTC nail with antibiotics as well.  They are very hesitant to do this as they have undergone a refracture through his previous ankle fracture that required the TTC nail.  The patient has made significant progress and they would like to try to avoid having too much of a setback.  If we do remove this we would likely need to brace the ankle to prevent a refracture. Below-knee amputation is the third option.  I discussed this with him with the possibility of obtaining a prosthesis once his stump heals.  This would resolve the infection and not have to worry about the risk of refracture.  At this point they would try at all cost to avoid the amputation as they do not feel that he would do well with this.  They also would like to avoid the surgery where we would have to remove the nail.  I do not think a debridement without hardware removal would be  adequate.  They are concerned about refracture risk.  However if he does not improve with antibiotics he will likely need this at least.  They understand this and wish to try nonsurgical route with IV antibiotics.  I will recommend an infectious disease consult to help manage as they have been following him previously.  We will continue to monitor his wounds and foot to see if there is indication for debridement and hardware removal.  I would also recommend holding his anticoagulation as well.  Franky MYRTIS Light, MD [facility] [phone] (office) [email or web address]

## 2023-09-18 DIAGNOSIS — T84629S Infection and inflammatory reaction due to internal fixation device of unspecified bone of leg, sequela: Secondary | ICD-10-CM

## 2023-09-18 LAB — BASIC METABOLIC PANEL WITH GFR
Anion gap: 11 (ref 5–15)
BUN: 14 mg/dL (ref 8–23)
CO2: 20 mmol/L — ABNORMAL LOW (ref 22–32)
Calcium: 8.5 mg/dL — ABNORMAL LOW (ref 8.9–10.3)
Chloride: 105 mmol/L (ref 98–111)
Creatinine, Ser: 1.89 mg/dL — ABNORMAL HIGH (ref 0.61–1.24)
GFR, Estimated: 38 mL/min — ABNORMAL LOW (ref >60)
Glucose, Bld: 176 mg/dL — ABNORMAL HIGH (ref 70–99)
Potassium: 4.2 mmol/L (ref 3.5–5.1)
Sodium: 136 mmol/L (ref 135–145)

## 2023-09-18 LAB — GLUCOSE, CAPILLARY
Glucose-Capillary: 152 mg/dL — ABNORMAL HIGH (ref 70–99)
Glucose-Capillary: 149 mg/dL — ABNORMAL HIGH (ref 70–99)
Glucose-Capillary: 181 mg/dL — ABNORMAL HIGH (ref 70–99)
Glucose-Capillary: 148 mg/dL — ABNORMAL HIGH (ref 70–99)

## 2023-09-18 LAB — SURGICAL PCR SCREEN
MRSA, PCR: NEGATIVE
Staphylococcus aureus: NEGATIVE

## 2023-09-18 LAB — CBC
HCT: 27.1 % — ABNORMAL LOW (ref 39.0–52.0)
Hemoglobin: 8.9 g/dL — ABNORMAL LOW (ref 13.0–17.0)
MCH: 29.6 pg (ref 26.0–34.0)
MCHC: 32.8 g/dL (ref 30.0–36.0)
MCV: 90 fL (ref 80.0–100.0)
Platelets: 302 K/uL (ref 150–400)
RBC: 3.01 MIL/uL — ABNORMAL LOW (ref 4.22–5.81)
RDW: 15.5 % (ref 11.5–15.5)
WBC: 12.1 K/uL — ABNORMAL HIGH (ref 4.0–10.5)
nRBC: 0 % (ref 0.0–0.2)

## 2023-09-18 LAB — CK: Total CK: 19 U/L — ABNORMAL LOW (ref 49–397)

## 2023-09-18 LAB — SEDIMENTATION RATE: Sed Rate: 103 mm/h — ABNORMAL HIGH (ref 0–16)

## 2023-09-18 LAB — C-REACTIVE PROTEIN: CRP: 7.6 mg/dL — ABNORMAL HIGH (ref <1.0)

## 2023-09-18 MED ORDER — ONDANSETRON HCL 4 MG/2ML IJ SOLN: 4.0000 mg | Freq: Once | INTRAMUSCULAR | Status: DC

## 2023-09-18 MED ORDER — SODIUM CHLORIDE 0.9 % IV SOLN
1.0000 g | INTRAVENOUS | Status: DC
  Administered 2023-09-18 – 2023-09-22 (×5): 1 g via INTRAVENOUS
  Filled 2023-09-18 (×6): qty 1000

## 2023-09-18 MED ORDER — SODIUM CHLORIDE 0.9 % IV SOLN
8.0000 mg/kg | Freq: Every day | INTRAVENOUS | Status: DC
  Administered 2023-09-18 – 2023-09-21 (×4): 800 mg via INTRAVENOUS
  Filled 2023-09-18 (×5): qty 16

## 2023-09-18 MED ORDER — ONDANSETRON HCL 4 MG/2ML IJ SOLN
4.0000 mg | Freq: Three times a day (TID) | INTRAMUSCULAR | Status: DC | PRN
  Administered 2023-09-22: 4 mg via INTRAVENOUS
  Filled 2023-09-18: qty 2

## 2023-09-18 NOTE — Progress Notes (Addendum)
 HD#2 SUBJECTIVE:  Patient Summary: Harold [name] is a 68 y.o. 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    Overnight Events: none, CPAP last night, afebrile 97.7 this am  Interim History:  Patient was on his CPAP machine when we entered the room. He decided to use it last night because he sleeps better with it. He usually sleeps with the bed elevated and with his CPAP machine at home. Patient did not feel dizziness when he sat up to do PT exercises yesterday afternoon. States pain is still there but pain medicines are keeping it under control.  Denies chest pain, shortness of breath, abdominal pain. Has normal urination.  Patient thinks his blood thinners makes his wounds worse and does not want us  to restart them. Discussed that we will see what orthopedic surgery wants to do regarding these medications as they are important given his afib and vascular disease. Patient doesn't want to be on a special diet, we talked to him about the importance of sugar control for wound healing. Recommend going from heart healthy and carb diet to solely carb diet, patient in agreement.   OBJECTIVE:  Vital Signs: Vitals:   09/17/23 0758 09/17/23 1657 09/17/23 1925 09/18/23 0404  BP: (!) 158/75 121/66 131/63 (!) 163/76  Pulse: 65 61 61 65  Resp: 18 18 18 16   Temp: 97.9 F (36.6 C) 98 F (36.7 C) 98.4 F (36.9 C) 97.7 F (36.5 C)  TempSrc: Oral  Oral Oral  SpO2: 98% 97% 99% 97%  Weight:      Height:       Supplemental O2: Room Air SpO2: 97 %  Filed Weights   09/16/23 1234  Weight: 102.5 kg     Intake/Output Summary (Last 24 hours) at 09/18/2023 9294 Last data filed at 09/18/2023 0500 Gross per 24 hour  Intake 800 ml  Output 800 ml  Net 0 ml   Net IO  Since Admission: -100 mL [09/18/23 0705]  Physical Exam: Physical Exam Constitutional:      General: He is not in acute distress.    Appearance: Normal appearance. He is not ill-appearing.  HENT:     Head: Normocephalic and atraumatic.     Nose: No congestion or rhinorrhea.  Eyes:     Extraocular Movements: Extraocular movements intact.     Conjunctiva/sclera: Conjunctivae normal.  Cardiovascular:     Rate and Rhythm: Normal rate and regular rhythm.  Pulmonary:     Effort: Pulmonary effort is normal.     Breath sounds: Normal breath sounds. No wheezing.  Abdominal:     General: Bowel sounds are normal.     Palpations: Abdomen is soft.     Tenderness: There is no abdominal tenderness. There is no guarding.  Musculoskeletal:     Right lower leg: Edema present.  Skin:    General: Skin is warm and dry.     Comments: Ecchymosis likely 2/2 venipuncture right forearm Right ankle bandaged and in protective covering   Neurological:     Mental Status: He is alert and oriented to person, place, and time. Mental status is at baseline.  Psychiatric:        Mood and Affect: Mood normal.     Patient Lines/Drains/Airways Status     Active Line/Drains/Airways  Name Placement date Placement time Site Days   Peripheral IV 09/16/23 20 G Left Antecubital 09/16/23  1252  Antecubital  2   External Urinary Catheter 09/16/23  2100  --  2   Pressure Injury 06/20/23 Sacrum Right Unstageable - Full thickness tissue loss in which the base of the injury is covered by slough (yellow, tan, gray, green or brown) and/or eschar (tan, brown or black) in the wound bed. 06/20/23  0050  -- 90   Wound / Incision (Open or Dehisced) 06/20/23 Incision - Open;Other (Comment) Ankle Lateral;Right 06/20/23  0045  Ankle  90   Wound / Incision (Open or Dehisced) 07/18/23 Arm Left;Lower;Posterior 07/18/23  0524  Arm  62   Wound / Incision (Open or Dehisced) 07/30/23 Non-pressure wound Heel Right 07/30/23  1540  Heel   50   Wound / Incision (Open or Dehisced) 07/30/23 Foot Right;Posterior 07/30/23  1540  Foot  50            Pertinent labs and imaging:  Waiting on todays CBC and BMP w GFR    Latest Ref Rng & Units 09/17/2023    5:12 AM 09/16/2023   12:45 PM 08/02/2023    2:44 AM  CBC  WBC 4.0 - 10.5 K/uL 7.0  7.6  5.6   Hemoglobin 13.0 - 17.0 g/dL 7.3  8.1  89.3   Hematocrit 39.0 - 52.0 % 22.4  25.0  32.1   Platelets 150 - 400 K/uL 290  323  204        Latest Ref Rng & Units 09/17/2023    5:12 AM 09/16/2023   12:45 PM 08/08/2023    3:18 AM  CMP  Glucose 70 - 99 mg/dL 782  764  843   BUN 8 - 23 mg/dL 18  19  11    Creatinine 0.61 - 1.24 mg/dL 8.41  8.46  8.06   Sodium 135 - 145 mmol/L 134  134  139   Potassium 3.5 - 5.1 mmol/L 3.7  4.2  3.4   Chloride 98 - 111 mmol/L 104  103  111   CO2 22 - 32 mmol/L 21  22  21    Calcium  8.9 - 10.3 mg/dL 8.2  8.6  8.0   Total Protein 6.5 - 8.1 g/dL  5.3    Total Bilirubin 0.0 - 1.2 mg/dL  0.4    Alkaline Phos 38 - 126 U/L  91    AST 15 - 41 U/L  14    ALT 0 - 44 U/L  11      No results found.  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 [facility]) Active Problems:   Diabetes mellitus type 2 with neurological manifestations (HCC)   Essential hypertension   Neuropathy   Hyperlipidemia associated with type 2 diabetes mellitus (HCC)   CAD, LAD/CFX DES 04/28/12- staged RCA DES 04/29/12 after pt declined CABG, cath 05/14/13 stable CAD medical therapy   COPD (chronic obstructive pulmonary disease) (HCC)   Chronic heart failure with preserved ejection fraction (HFpEF) (HCC)   Gastroesophageal reflux disease without esophagitis   PVD (peripheral vascular disease) (HCC)   CKD stage 3b, GFR 30-44 ml/min (HCC)   Normocytic anemia   PAD (peripheral artery disease) (HCC)   Type 2 diabetes mellitus with diabetic nephropathy, with long-term current use of insulin  (HCC)   Stage 3a chronic kidney disease (HCC)   Type 2  diabetes mellitus with diabetic polyneuropathy, with long-term current use  of insulin  (HCC)   Plan: #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  -Hardware removed by Dr. Kendal 06/21/2023. Hospital discharge on 07/22/23 with chronic septic joint completed ertapenem  and daptomycin  on 07/31/2023.  -Right digits are swollen, weeping skins lesions needing repeated dressing changes.  -MRI 7/29: Suspicious for OM in distal tibia, talus, and calcaneus. Suspected septic tibiotalar, distal tibiofibular, and subtalar joints. -WBC wnl and patient afebrile 7/30 -pending 7/29 blood cultures, no growth   -IV Linezolid  and change to cefepime  (7/30) until 7/31. Switched to IV dapto and ertapenem  on 7/31. Monitor CK and eosinophils.  -7/31 CK 19, ESR 103, CRP 7.6 -Ortho surg and ID spoke with patient. Plan to proceed with surgery for debridement, nail removal, and brace. Will make NPO at midnight  -holding anticoagulation/platelet pending ortho surg recommendations -tylenol , oxycodone , and hydromorphone  orders in for pain control    #Peripheral Vascular Disease -Follows with Dr. Darron who in Aug 2021 did successful balloon angioplasty to tibioperoneal trunk in posterior tibial artery with drug-coated angioplasty. In sept 2024, patient had reccurent wounds on left leg which were healed after Dr. Darron did successful orbital atherectomy and drug-coated balloon angioplasty to left popliteal artery, TP trunk and posterior tibial artery.  -After fall in Nov 2024, Dr. Serene did Angiogram which showed calcified stenosis in distal right SFA, treated with Eluvia drug-eluting stent.  -OT and PT seeing  --holding Plavix  pending ortho surg plan   #Qtc prolongation #Paroxysmal atrial fibrillation   -Afib diagnosed in 2023, refractory to [facility] on 03/13/22 and 03/27/22, Dr. Nancey transitioned to medical therapy starting amio -Hospitalization in June 2025, patient had Qtc prolongation (likely due to polypharmacy with prolong drugs) treated with amiodarone  with recommendations to avoid further Qtc prolong medications -Continue home amiodarone  200 mg twice daily -Holding apixaban  5 mg twice daily per ortho   #Diabetes Mellitus Type 2 -A1c 5.2% on 5/30 -glucose 235 on admission, 176 today  -goal sugars <180 for wound healing optimization -sliding scale   -glargine 8 units daily (increased from 5 units on 7/30), aspart 0-15   #Chronic Kidney Disease Stage 3b -patient denies symptoms concerning for infection, has been urinating normally  -has had dialysis in the past but not currently  -7/31 on admission Cr 1.89, BUN 14 -7/30 Cr 1.58, BUN 18 -monitor electrolytes and urine output   #Coronary artery disease s/p CABG -home meds: apixaban  5 mg twice daily -holding anticoagulation/platelet pending ortho surg plan -Resume home Lipitor 40 mg    #Chronic anemia -7/29 on admission hgb 8.1, 7.3 today -anticipate some blood loss if he is going to have surgery -monitor CBC closely    #Chronic HTN -Carvedilol  discontinued in July due to concern for orthostatic hypotension -160/68 on admission 7/29. 158/75 on 7/30 AM, 190/77 on 7/31 AM.  -Continue home losartan  25 mg    #OSA on CPAP -continue CPAP   #GAD #Depression -duloxetine  20 mg -trazodone  50 mg   #BPH -finasteride  5 mg   #GERD -continue pantoprazole  40 mg tablet   #COPD   #Hypothyroidism -continue home levothyroxine  88 mcg     Best Practice: Diet: Diabetic diet Code: Full  Disposition planning: Therapy Recs: Pending, DME:  Family Contact: Spouse, to be notified. DISPO: Anticipated discharge pending orthopedic surgery to Skilled nursing facility   Signature:  Viktoria Charmayne [facility]  7:05 AM, 09/18/2023  On Call pager [phone]

## 2023-09-18 NOTE — Plan of Care (Signed)

## 2023-09-18 NOTE — Consult Note (Signed)
 [facility]    Date of Admission:  09/16/2023     Reason for Consult: recurrent/chronic osteomyelitis right ankle    Referring Provider: Reyes Fenton     Lines:  Peripheral iv's  Abx: 7/31-c ertapenem  7/31-c daptomycin   7/30-31 linezolid  7/30-31 cefepime         Assessment: 68 yo male ckd3, qtc prolongation, hx orif distal right lower ext complicated by hardware associated om admitted for the 4rd time this year for the same infection  Prior cultures: 05/16/23 blood cx enterobacter cloacae 03/31/23 operative debridement cx mrse (R bactrim, tetracycline), enterobacter cloacae (S cipro , bactrim)  His last I&D was 06/20/23 (no operative cx sent) and partial hardware remains  Mri 7/29 and clinical course does suggest previous hardware is complicating recurrence  This admission: 7/29 bcx negative   I discussed with him he is relucant to have bka but amenable to removal of hardware, debridement, and external brace   While enterobacter has consistently regrown, I think it's difficult to discount staph epi as well, and agree with the primary team to cover for both   I strongly advised surgery as he doesn't have good oral option for staph epi, or enterobacter (age, qtc prolongation). He also reports rash to bactrim and penicillin    Plan: Continue current dapto, ertapenem   Weekly cbc, cmp, crp, esr, cpk Please reengage orthopedics again regarding surgical options Without surgery/removal of hardware he'll need chronic oral abx suppresion which as above mentioned would be difficult Maintain standard isolation precaution Discussed with primary team     ------------------------------------------------ Principal Problem:   Septic arthritis of ankle and foot region [facility]) Active Problems:   Diabetes mellitus type 2 with neurological manifestations (HCC)   Essential hypertension   Neuropathy   Hyperlipidemia associated with type 2  diabetes mellitus (HCC)   CAD, LAD/CFX DES 04/28/12- staged RCA DES 04/29/12 after pt declined CABG, cath 05/14/13 stable CAD medical therapy   COPD (chronic obstructive pulmonary disease) (HCC)   Chronic heart failure with preserved ejection fraction (HFpEF) (HCC)   Gastroesophageal reflux disease without esophagitis   PVD (peripheral vascular disease) (HCC)   CKD stage 3b, GFR 30-44 ml/min (HCC)   Normocytic anemia   PAD (peripheral artery disease) (HCC)   Type 2 diabetes mellitus with diabetic nephropathy, with long-term current use of insulin  (HCC)   Stage 3a chronic kidney disease (HCC)   Type 2 diabetes mellitus with diabetic polyneuropathy, with long-term current use of insulin  (HCC)    HPI: Harold [name] is a 68 y.o. male ckd3, qtc prolongation, hx orif distal right lower ext complicated by hardware associated om admitted for the 4rd time this year for the same infection  Patient well known to id service  Last I&D 5/2 with partial hardware removal. Ortho wasn't concerned about other hardware in infected bed  Lov with id 07/29/23 finished iv dapto/ertapenem  (no culture on 5/2 I&D)  He returned from snf 7/29 for pain and drainage from the medial side of the right ankle  Afebrile here Ortho following. Surgical options discussed and he is not interested in bka but rather external brace with I&D and hardware removal  No other concern      Family History  Adopted: Yes  Family history unknown: Yes    Social History   Tobacco Use   Smoking status: Former    Current packs/day: 0.00    Average packs/day: 1 pack/day for 42.0 years (42.0 ttl pk-yrs)  Types: Cigarettes    Start date: 03/22/1961    Quit date: 03/23/2003    Years since quitting: 20.5   Smokeless tobacco: Never   Tobacco comments:    Former smoker 02/19/22  Vaping Use   Vaping status: Never Used  Substance Use Topics   Alcohol use: No    Alcohol/week: 0.0 standard drinks of alcohol   Drug use: No     Allergies  Allergen Reactions   Beta-Lactamase Inhibitors (Beta-Lactam) Hives, Itching and Rash    AX - penicillin     Ancef  given 9/24 with no obvious reaction   Other Nausea And Vomiting    General Anesthesia - sometimes causes nausea and vomiting * OK to use scopolamine  patch     Wellbutrin [Bupropion] Other (See Comments)    Mood Changes   Metformin  And Related Diarrhea and Nausea Only   Sulfa Antibiotics Hives and Other (See Comments)    Bactrim   Tetracyclines & Related Hives and Rash    doxycycline     Review of Systems: ROS All Other ROS was negative, except mentioned above   Past Medical History:  Diagnosis Date   Anemia    Anginal pain (HCC) 04/2013   Cardiac cath showed patent stents with distal LAD, circumflex-OM and RCA disease in small vessels.   Anxiety    Atrial fibrillation (HCC) 12/2021   CAD S/P percutaneous coronary angioplasty 2003, 04/2012   status post PCI to LAD, circumflex-OM 2, RCA   Carotid artery occlusion    Chronic renal insufficiency, stage II (mild)    Chronic venous insufficiency    varicosities, no reflux; dopplers 04/14/12- valvular insufficiency in the R and L GSV   Complication of anesthesia    COPD, mild (HCC)    Depression    situaltional    Diabetes mellitus    Diabetic neuropathy (HCC) 08/24/2019   Dyslipidemia associated with type 2 diabetes mellitus (HCC)    Fall at home, initial encounter 01/02/2023   GERD (gastroesophageal reflux disease)    History of cardioversion 12/2021   at [facility]   HTN (hypertension)    Hypothyroidism    Neuromuscular disorder (HCC)    neuropathy in feet   Neuropathy    notably improved following PCI with improved cardiac function   Obesity    Obesity, Class II, BMI 35.0-39.9, with comorbidity (see actual BMI)    BMI 39; wgt loss efforts in place; seeing Dietician   Open ankle fracture 01/02/2023   Orthostatic hypotension    PONV (postoperative nausea and vomiting)    [place]    Pulmonary hypertension (HCC) 04/2012   PA pressure   Sleep apnea    uses nightly   Vitamin D  deficiency    per facility notes       Scheduled Meds:  acetaminophen   1,000 mg Oral Q6H   Or   acetaminophen   650 mg Rectal Q6H   atorvastatin   40 mg Oral Daily   docusate sodium   100 mg Oral BID   DULoxetine   20 mg Oral Daily   ezetimibe   10 mg Oral QPM   finasteride   5 mg Oral Daily   gabapentin   300 mg Oral QHS   insulin  aspart  0-15 Units Subcutaneous TID WC   insulin  aspart  0-5 Units Subcutaneous QHS   insulin  glargine-yfgn  8 Units Subcutaneous QHS   levothyroxine   88 mcg Oral Q0600   losartan   25 mg Oral Daily   pantoprazole   40 mg Oral Daily  traZODone   50 mg Oral QHS   Continuous Infusions:  DAPTOmycin  800 mg (09/18/23 1323)   ertapenem  1 g (09/18/23 1127)   PRN Meds:.HYDROmorphone  (DILAUDID ) injection, ondansetron  (ZOFRAN ) IV, oxyCODONE , polyethylene glycol   OBJECTIVE: Blood pressure (!) 187/74, pulse 73, temperature (!) 97.4 F (36.3 C), temperature source Oral, resp. rate 16, height 6' 1 (1.854 m), weight 102.5 kg, SpO2 100%.  Physical Exam  General/constitutional: no distress, pleasant HEENT: Normocephalic, PER, Conj Clear, EOMI, Oropharynx clear Neck supple CV: rrr no mrg Lungs: clear to auscultation, normal respiratory effort Abd: Soft, Nontender Ext: no edema Skin: No Rash Neuro: nonfocal MSK: right foot dressing c/d; no proximal cellulitis   Lab Results Lab Results  Component Value Date   WBC 12.1 (H) 09/18/2023   HGB 8.9 (L) 09/18/2023   HCT 27.1 (L) 09/18/2023   MCV 90.0 09/18/2023   PLT 302 09/18/2023    Lab Results  Component Value Date   CREATININE 1.89 (H) 09/18/2023   BUN 14 09/18/2023   NA 136 09/18/2023   K 4.2 09/18/2023   CL 105 09/18/2023   CO2 20 (L) 09/18/2023    Lab Results  Component Value Date   ALT 11 09/16/2023   AST 14 (L) 09/16/2023   GGT 28 02/20/2018   ALKPHOS 91 09/16/2023   BILITOT 0.4  09/16/2023      Microbiology: Recent Results (from the past 240 hours)  Blood Culture (routine x 2)     Status: None (Preliminary result)   Collection Time: 09/16/23 12:45 PM   Specimen: BLOOD  Result Value Ref Range Status   Specimen Description BLOOD LEFT ANTECUBITAL  Final   Special Requests   Final    BOTTLES DRAWN AEROBIC AND ANAEROBIC Blood Culture adequate volume   Culture   Final    NO GROWTH 2 DAYS Performed at [facility], [address], [place], [place] [address]    Report Status PENDING  Incomplete  Blood Culture (routine x 2)     Status: None (Preliminary result)   Collection Time: 09/16/23 12:50 PM   Specimen: BLOOD LEFT FOREARM  Result Value Ref Range Status   Specimen Description BLOOD LEFT FOREARM  Final   Special Requests   Final    BOTTLES DRAWN AEROBIC AND ANAEROBIC Blood Culture adequate volume   Culture   Final    NO GROWTH 2 DAYS Performed at [facility], [address], [place], [place] [address]    Report Status PENDING  Incomplete     Serology:    Imaging: If present, new imagings (plain films, ct scans, and mri) have been personally visualized and interpreted; radiology reports have been reviewed. Decision making incorporated into the Impression / Recommendations.  09/16/23 mri right ankle 1. Findings are suspicious for osteomyelitis of the distal tibia, talus, and calcaneus, with lucency surrounding the IM nail. 2. Fracture fragments of the posterior malleolus and medial malleolus noted. 3. Suspected septic tibiotalar, distal tibiofibular, and subtalar joints. 4. Fluid-filled track through and along the medial posterior calcaneus, possibly from prior hardware placement but a draining sinus tract is not excluded. 5. Disruption of the subcutaneous tissues colinear with the IM nail along the plantar foot, probably incidental although infection in this region is not excluded. 6. Extensive subcutaneous edema in the ankle  suspicious for cellulitis. 7. Substantial distal tibialis posterior tendinopathy with indistinctness and possibly partial tearing in the vicinity of what appears to be a draining sinus tract from the tibiotalar joint to  the cutaneous surface. 8. Common peroneus tendon sheath tenosynovitis. Moderate peroneus brevis peroneus longus tendinopathy. 9. Mild distal tibialis anterior tendinopathy. 10. Mild distal Achilles tendinopathy. 11. Thickened medial band of the plantar fascia suspicious for plantar fasciitis. 12. Thickening and indistinctness of portions of the anterior talofibular ligament, query prior injury. 13. Edema within the deep tibiotalar portion of the deltoid ligament, compatible with sprain or partial tear.   Constance ONEIDA Passer, MD [facility] [phone] pager    09/18/2023, 2:07 PM

## 2023-09-18 NOTE — Progress Notes (Signed)
 OT Cancellation Note  Patient Details Name: Harold [name] MRN: [id number] DOB: [birth year]   Cancelled Treatment:    Reason Eval/Treat Not Completed: (P) Pain limiting ability to participate, Pt states 20/10 pain, worse than yesterday, reports vomiting earlier, is anxious about possibly having surgery, asking for pain meds, will return tomorrow.  Elouise JONELLE Bott 09/18/2023, 12:17 PM

## 2023-09-18 NOTE — Progress Notes (Addendum)
 Called to bedside about some confusion regarding patient's surgery scheduled tomorrow. I discussed that per the ortho note on 7/30 and what's written on the OR board, the procedure tomorrow is for hardware removal as well as irrigation debridement. Patient and partner (on the phone) both do not want hardware removal, but are ok with irrigation and debridement. Per ortho note on 7/30 it appears that doing an irrigation and debridement is not possible without hardware removal, and as mentioned before as of now this is not what the patient or partner want. Listed on OR board is hardware removal and irrigation and debridement, and I discussed with them. They would like to cancel the procedure tomorrow and continue antibiotics for now. I will change his diet.   Partner does express concern as well that with his pain control, patient has been more confused and gets confused easily. This was evidenced during my conversation as multiple times pt went back and forth on what we were discussing. However he is still alert and orientated x3. Because of this, partner has requested that anytime a medical professional enters the room to discuss the plan, to have patient call partner and have him on the phone. Patient is agreeable for this.

## 2023-09-18 NOTE — NC FL2 (Signed)
 [place]  MEDICAID FL2 LEVEL OF CARE FORM     IDENTIFICATION  Patient Name: Harold [name] Birthdate: 05-26-1955 Sex: male Admission Date (Current Location): 09/16/2023  [facility] and [place] Number:  Producer, television/film/video and Address:  [facility], [address] [place], [place] [address]      Provider Number: [id number]  Attending Physician Name and Address:  Eben Reyes BROCKS, MD  Relative Name and Phone Number:  Ryun Velez; Spouse; [phone]    Current Level of Care: Hospital Recommended Level of Care: Skilled Nursing Facility Prior Approval Number:    Date Approved/Denied:   PASRR Number: [id number]  Discharge Plan: SNF    Current Diagnoses: Patient Active Problem List   Diagnosis Date Noted   Type 2 diabetes mellitus with diabetic polyneuropathy, with long-term current use of insulin  (HCC) 09/17/2023   Septic arthritis of ankle and foot region (HCC) 09/16/2023   Type 2 diabetes mellitus with diabetic nephropathy, with long-term current use of insulin  (HCC) 09/16/2023   Stage 3a chronic kidney disease (HCC) 09/16/2023   Hypokalemia 07/30/2023   History of COPD 07/30/2023   History of peripheral vascular disease 07/30/2023   Non-insulin  dependent type 2 diabetes mellitus (HCC) 07/30/2023   BPH (benign prostatic hyperplasia) 07/30/2023   Major depressive disorder, recurrent episode, moderate (HCC) 07/19/2023   PICC (peripherally inserted central catheter) in place 07/18/2023   Dizziness 07/17/2023   Hypotension 07/17/2023   Infection of lower extremity associated with hardware (HCC) 06/19/2023   Hyponatremia 06/19/2023   Open wound of right ankle 06/18/2023   Protein-calorie malnutrition, severe 05/21/2023   Open right ankle fracture, sequela 05/12/2023   Obesity, Class II, BMI 35-39.9 04/23/2023   Osteomyelitis (HCC) 04/17/2023   Cellulitis of right lower extremity 03/28/2023   Ischemic ulcer of ankle with necrosis  of bone, right (HCC) 03/28/2023   Abscess of skin of right ankle 03/28/2023   PAD (peripheral artery disease) (HCC) 03/28/2023   Vitamin D  deficiency 01/07/2023   QT prolongation 01/02/2023   Acute renal failure superimposed on stage 3b chronic kidney disease (HCC) 01/02/2023   Normocytic anemia 01/02/2023   Paroxysmal atrial fibrillation (HCC) 01/02/2023   Uncontrolled type 2 diabetes mellitus with hyperglycemia, with long-term current use of insulin  (HCC) 01/02/2023   Hypothyroidism 01/02/2023   CKD stage 3b, GFR 30-44 ml/min (HCC) 11/07/2022   CAD in native artery 11/07/2022   PVD (peripheral vascular disease) (HCC) 11/06/2022   Hypercoagulable state due to persistent atrial fibrillation (HCC) 02/19/2022   Persistent atrial fibrillation (HCC) 01/28/2022   Lumbar stenosis with neurogenic claudication 07/26/2020   Diabetic neuropathy (HCC) 08/24/2019   Trigger thumb of left hand 09/02/2018   Atherosclerosis of native artery of both lower extremities with intermittent claudication (HCC) 05/12/2018   Pain of left hand 03/26/2018   Trigger finger 03/03/2017   Pain in finger of left hand 03/03/2017   Snoring 08/20/2016   Morbid (severe) obesity due to excess calories (HCC) 04/18/2016   Venous stasis of both lower extremities - with edema 02/25/2015   Right-sided chest wall pain 05/08/2014   Gastroesophageal reflux disease without esophagitis 10/10/2013   Chronic heart failure with preserved ejection fraction (HFpEF) (HCC) 06/27/2013   COPD (chronic obstructive pulmonary disease) (HCC) 06/17/2013   Restrictive lung disease 06/17/2013   Obesity, Class III, BMI 40-49.9 (morbid obesity) 09/01/2012   Generalized weakness 09/01/2012   Chronic renal disease, stage IV (HCC) 04/30/2012   OSA on CPAP 04/29/2012   Pulmonary hypertension, 04/29/2012  Dyspnea on exertion   04/26/2012   CAD, LAD/CFX DES 04/28/12- staged RCA DES 04/29/12 after pt declined CABG, cath 05/14/13 stable CAD medical  therapy    Carotid artery stenosis without cerebral infarction, bilateral 11/07/2011   Diabetes mellitus type 2 with neurological manifestations (HCC) 04/29/2011   Essential hypertension 04/29/2011   Neuropathy 04/29/2011   Hyperlipidemia associated with type 2 diabetes mellitus (HCC) 04/29/2011   Anxiety and depression 04/29/2011    Orientation RESPIRATION BLADDER Height & Weight     Self, Time, Situation, Place  Normal (Room Air) Continent, External catheter Weight: 226 lb (102.5 kg) Height:  6' 1 (185.4 cm)  BEHAVIORAL SYMPTOMS/MOOD NEUROLOGICAL BOWEL NUTRITION STATUS    Convulsions/Seizures (History of seizure-like activity) Continent Diet (Please see discharge summary)  AMBULATORY STATUS COMMUNICATION OF NEEDS Skin   Extensive Assist Verbally Normal                       Personal Care Assistance Level of Assistance  Bathing, Dressing, Feeding Bathing Assistance: Maximum assistance Feeding assistance: Maximum assistance Dressing Assistance: Maximum assistance     Functional Limitations Info  Sight Sight Info: Impaired (R and L (Eyeglasses))        SPECIAL CARE FACTORS FREQUENCY  PT (By licensed PT), OT (By licensed OT)     PT Frequency: 5x OT Frequency: 5x            Contractures Contractures Info: Not present    Additional Factors Info  Code Status, Allergies, Psychotropic, Insulin  Sliding Scale Code Status Info: Full Code Allergies Info: Beta-lactamase Inhibitors (beta-lactam); General Anesthesia; Wellbutrin (bupropion); Metformin  And Related; Sulfa Antibiotics; Tetracyclines & Related Psychotropic Info: Zoloft  and Cymbalta  Insulin  Sliding Scale Info: Please see discharge summary       Current Medications (09/18/2023):  This is the current hospital active medication list Current Facility-Administered Medications  Medication Dose Route Frequency Provider Last Rate Last Admin   acetaminophen  (TYLENOL ) tablet 1,000 mg  1,000 mg Oral Q6H Nooruddin,  Saad, MD   1,000 mg at 09/18/23 9158   Or   acetaminophen  (TYLENOL ) suppository 650 mg  650 mg Rectal Q6H Nooruddin, Saad, MD       atorvastatin  (LIPITOR) tablet 40 mg  40 mg Oral Daily Tobie Gaines, DO   40 mg at 09/17/23 1228   docusate sodium  (COLACE) capsule 100 mg  100 mg Oral BID Tobie Gaines, DO   100 mg at 09/16/23 2203   DULoxetine  (CYMBALTA ) DR capsule 20 mg  20 mg Oral Daily Tobie Gaines, DO   20 mg at 09/17/23 1227   ertapenem  (INVANZ ) 1 g in sodium chloride  0.9 % 100 mL IVPB  1 g Intravenous Q24H Patel, Amar, DO       ezetimibe  (ZETIA ) tablet 10 mg  10 mg Oral QPM Tobie Gaines, DO   10 mg at 09/17/23 1832   finasteride  (PROSCAR ) tablet 5 mg  5 mg Oral Daily Tobie Gaines, DO   5 mg at 09/17/23 1234   gabapentin  (NEURONTIN ) capsule 300 mg  300 mg Oral QHS Tobie Gaines, DO   300 mg at 09/17/23 2200   HYDROmorphone  (DILAUDID ) injection 1 mg  1 mg Intravenous Q4H PRN Nooruddin, Saad, MD   1 mg at 09/18/23 9491   insulin  aspart (novoLOG ) injection 0-15 Units  0-15 Units Subcutaneous TID WC Patel, Amar, DO   3 Units at 09/18/23 9157   insulin  aspart (novoLOG ) injection 0-5 Units  0-5 Units Subcutaneous QHS Tobie Gaines, DO  2 Units at 09/16/23 2137   insulin  glargine-yfgn (SEMGLEE ) injection 8 Units  8 Units Subcutaneous QHS Charmayne Holmes, DO   8 Units at 09/17/23 2012   levothyroxine  (SYNTHROID ) tablet 88 mcg  88 mcg Oral Q0600 Tobie Gaines, DO   88 mcg at 09/18/23 9493   losartan  (COZAAR ) tablet 25 mg  25 mg Oral Daily Lyles, Elliott, DO   25 mg at 09/18/23 0840   ondansetron  (ZOFRAN ) injection 4 mg  4 mg Intravenous Q8H PRN Charmayne Holmes, DO       oxyCODONE  (Oxy IR/ROXICODONE ) immediate release tablet 5 mg  5 mg Oral Q6H PRN Nooruddin, Saad, MD   5 mg at 09/18/23 9157   pantoprazole  (PROTONIX ) EC tablet 40 mg  40 mg Oral Daily Patel, Amar, DO   40 mg at 09/17/23 1227   polyethylene glycol (MIRALAX  / GLYCOLAX ) packet 17 g  17 g Oral Daily PRN Patel, Amar, DO       traZODone  (DESYREL ) tablet 50  mg  50 mg Oral QHS Patel, Amar, DO   50 mg at 09/17/23 2200     Discharge Medications: Please see discharge summary for a list of discharge medications.  Relevant Imaging Results:  Relevant Lab Results:   Additional Information SSN; [id number]  Lauraine FORBES Saa, LCSW

## 2023-09-18 NOTE — TOC Progression Note (Signed)
 Transition of Care [facility]) - Progression Note    Patient Details  Name: Harold [name] MRN: [id number] Date of Birth: 08-24-1955  [facility]) CM/SW Contact  Lauraine FORBES Saa, LCSW Phone Number: 09/18/2023, 10:12 AM  Clinical Narrative:     10:12 AM Per medical team, patient may discharge to [facility] STR with PICC and approximately six weeks of IV antibiotics prior to transitioning to oral antibiotics if patient decided not to move forward with foot amputation. CSW sent FL2 to SNF. CSW will continue to follow and be available to assist.  Expected Discharge Plan: Skilled Nursing Facility Barriers to Discharge: Continued Medical Work up               Expected Discharge Plan and Services In-house Referral: Clinical Social Work   Post Acute Care Choice: Skilled Nursing Facility Living arrangements for the past 2 months: Single Family Home, Skilled Nursing Facility                   DME Agency:  ([facility])         HH Agency: [facility] ([facility])         Social Drivers of Health (SDOH) Interventions SDOH Screenings   Food Insecurity: No Food Insecurity (09/16/2023)  Housing: Low Risk  (09/16/2023)  Recent Concern: Housing - High Risk (07/31/2023)  Transportation Needs: Unmet Transportation Needs (09/16/2023)  Utilities: Not At Risk (09/16/2023)  Depression (PHQ2-9): Low Risk  (07/29/2023)  Social Connections: Moderately Isolated (09/16/2023)  Tobacco Use: Medium Risk (07/30/2023)    Readmission Risk Interventions     No data to display

## 2023-09-19 ENCOUNTER — Inpatient Hospital Stay (HOSPITAL_COMMUNITY): Admitting: Anesthesiology

## 2023-09-19 ENCOUNTER — Inpatient Hospital Stay (HOSPITAL_COMMUNITY)

## 2023-09-19 ENCOUNTER — Encounter (HOSPITAL_COMMUNITY): Payer: Self-pay | Admitting: Infectious Diseases

## 2023-09-19 ENCOUNTER — Encounter (HOSPITAL_COMMUNITY)
Admission: EM | Disposition: A | Discharge status: Skilled Nursing Facility | Payer: Self-pay | Source: Home / Self Care | Attending: Infectious Diseases

## 2023-09-19 ENCOUNTER — Other Ambulatory Visit: Payer: Self-pay

## 2023-09-19 DIAGNOSIS — M869 Osteomyelitis, unspecified: Secondary | ICD-10-CM

## 2023-09-19 DIAGNOSIS — Z794 Long term (current) use of insulin: Secondary | ICD-10-CM

## 2023-09-19 DIAGNOSIS — I2510 Atherosclerotic heart disease of native coronary artery without angina pectoris: Secondary | ICD-10-CM | POA: Diagnosis not present

## 2023-09-19 DIAGNOSIS — E1169 Type 2 diabetes mellitus with other specified complication: Secondary | ICD-10-CM | POA: Diagnosis not present

## 2023-09-19 HISTORY — PX: INCISION AND DRAINAGE OF WOUND: SHX1803

## 2023-09-19 LAB — HEMOGLOBIN AND HEMATOCRIT, BLOOD
HCT: 24.3 % — ABNORMAL LOW (ref 39.0–52.0)
Hemoglobin: 7.8 g/dL — ABNORMAL LOW (ref 13.0–17.0)

## 2023-09-19 LAB — CBC WITH DIFFERENTIAL/PLATELET
Abs Immature Granulocytes: 0.04 K/uL (ref 0.00–0.07)
Basophils Absolute: 0.1 K/uL (ref 0.0–0.1)
Basophils Relative: 1 %
Eosinophils Absolute: 0.9 K/uL — ABNORMAL HIGH (ref 0.0–0.5)
Eosinophils Relative: 12 %
HCT: 23.5 % — ABNORMAL LOW (ref 39.0–52.0)
Hemoglobin: 7.6 g/dL — ABNORMAL LOW (ref 13.0–17.0)
Immature Granulocytes: 1 %
Lymphocytes Relative: 14 %
Lymphs Abs: 1.1 K/uL (ref 0.7–4.0)
MCH: 28.7 pg (ref 26.0–34.0)
MCHC: 32.3 g/dL (ref 30.0–36.0)
MCV: 88.7 fL (ref 80.0–100.0)
Monocytes Absolute: 0.4 K/uL (ref 0.1–1.0)
Monocytes Relative: 6 %
Neutro Abs: 4.9 K/uL (ref 1.7–7.7)
Neutrophils Relative %: 66 %
Platelets: 270 K/uL (ref 150–400)
RBC: 2.65 MIL/uL — ABNORMAL LOW (ref 4.22–5.81)
RDW: 15.5 % (ref 11.5–15.5)
WBC: 7.4 K/uL (ref 4.0–10.5)
nRBC: 0 % (ref 0.0–0.2)

## 2023-09-19 LAB — TYPE AND SCREEN
ABO/RH(D): A POS
Antibody Screen: NEGATIVE

## 2023-09-19 LAB — GLUCOSE, CAPILLARY
Glucose-Capillary: 129 mg/dL — ABNORMAL HIGH (ref 70–99)
Glucose-Capillary: 142 mg/dL — ABNORMAL HIGH (ref 70–99)
Glucose-Capillary: 139 mg/dL — ABNORMAL HIGH (ref 70–99)
Glucose-Capillary: 128 mg/dL — ABNORMAL HIGH (ref 70–99)
Glucose-Capillary: 127 mg/dL — ABNORMAL HIGH (ref 70–99)

## 2023-09-19 LAB — BASIC METABOLIC PANEL WITH GFR
Anion gap: 9 (ref 5–15)
BUN: 13 mg/dL (ref 8–23)
CO2: 21 mmol/L — ABNORMAL LOW (ref 22–32)
Calcium: 8.3 mg/dL — ABNORMAL LOW (ref 8.9–10.3)
Chloride: 106 mmol/L (ref 98–111)
Creatinine, Ser: 1.73 mg/dL — ABNORMAL HIGH (ref 0.61–1.24)
GFR, Estimated: 42 mL/min — ABNORMAL LOW (ref >60)
Glucose, Bld: 149 mg/dL — ABNORMAL HIGH (ref 70–99)
Potassium: 3.6 mmol/L (ref 3.5–5.1)
Sodium: 136 mmol/L (ref 135–145)

## 2023-09-19 SURGERY — IRRIGATION AND DEBRIDEMENT WOUND
Anesthesia: General | Laterality: Right

## 2023-09-19 MED ORDER — CHLORHEXIDINE GLUCONATE 0.12 % MT SOLN: 15.0000 mL | Freq: Once | OROMUCOSAL | Status: AC

## 2023-09-19 MED ORDER — CHLORHEXIDINE GLUCONATE CLOTH 2 % EX PADS
6.0000 | MEDICATED_PAD | Freq: Every day | CUTANEOUS | Status: DC
  Administered 2023-09-20 – 2023-09-22 (×3): 6 via TOPICAL

## 2023-09-19 MED ORDER — CLOPIDOGREL BISULFATE 75 MG PO TABS
75.0000 mg | ORAL_TABLET | Freq: Every day | ORAL | Status: DC
  Administered 2023-09-20 – 2023-09-22 (×3): 75 mg via ORAL
  Filled 2023-09-19 (×3): qty 1

## 2023-09-19 MED ORDER — OXYCODONE HCL 5 MG PO TABS
ORAL_TABLET | ORAL | Status: AC
  Filled 2023-09-19: qty 1

## 2023-09-19 MED ORDER — ORAL CARE MOUTH RINSE: 15.0000 mL | Freq: Once | OROMUCOSAL | Status: AC

## 2023-09-19 MED ORDER — DAPTOMYCIN IV (FOR PTA / DISCHARGE USE ONLY): 800.0000 mg | INTRAVENOUS | 0 refills | Status: DC

## 2023-09-19 MED ORDER — LIDOCAINE 2% (20 MG/ML) 5 ML SYRINGE
INTRAMUSCULAR | Status: DC | PRN
  Administered 2023-09-19: 100 mg via INTRAVENOUS

## 2023-09-19 MED ORDER — PROPOFOL 10 MG/ML IV BOLUS
INTRAVENOUS | Status: AC
  Filled 2023-09-19: qty 20

## 2023-09-19 MED ORDER — METOCLOPRAMIDE HCL 5 MG/ML IJ SOLN: 5.0000 mg | Freq: Three times a day (TID) | INTRAMUSCULAR | Status: DC | PRN

## 2023-09-19 MED ORDER — PROPOFOL 10 MG/ML IV BOLUS
INTRAVENOUS | Status: DC | PRN
  Administered 2023-09-19: 150 mg via INTRAVENOUS

## 2023-09-19 MED ORDER — APIXABAN 5 MG PO TABS
5.0000 mg | ORAL_TABLET | Freq: Two times a day (BID) | ORAL | Status: DC
  Administered 2023-09-20 – 2023-09-22 (×4): 5 mg via ORAL
  Filled 2023-09-19 (×5): qty 1

## 2023-09-19 MED ORDER — VANCOMYCIN HCL 1000 MG IV SOLR
INTRAVENOUS | Status: DC | PRN
Start: 2023-09-19 — End: 2023-09-19
  Administered 2023-09-19: 1000 mg via TOPICAL

## 2023-09-19 MED ORDER — ONDANSETRON HCL 4 MG PO TABS: 4.0000 mg | ORAL_TABLET | Freq: Three times a day (TID) | ORAL | 0 refills | Status: DC | PRN

## 2023-09-19 MED ORDER — OXYCODONE HCL 5 MG/5ML PO SOLN: 5.0000 mg | Freq: Once | ORAL | Status: AC | PRN

## 2023-09-19 MED ORDER — HYDROCORTISONE 1 % EX CREA
TOPICAL_CREAM | Freq: Three times a day (TID) | CUTANEOUS | Status: DC
  Administered 2023-09-19 – 2023-09-21 (×2): 1 via TOPICAL
  Filled 2023-09-19: qty 28

## 2023-09-19 MED ORDER — SODIUM CHLORIDE 0.9% FLUSH: 10.0000 mL | INTRAVENOUS | Status: DC | PRN

## 2023-09-19 MED ORDER — HYDROCORTISONE 1 % EX CREA: TOPICAL_CREAM | Freq: Three times a day (TID) | CUTANEOUS | Status: DC

## 2023-09-19 MED ORDER — ONDANSETRON HCL 4 MG/2ML IJ SOLN
INTRAMUSCULAR | Status: DC | PRN
  Administered 2023-09-19: 4 mg via INTRAVENOUS

## 2023-09-19 MED ORDER — LACTATED RINGERS IV SOLN: INTRAVENOUS | Status: DC

## 2023-09-19 MED ORDER — 0.9 % SODIUM CHLORIDE (POUR BTL) OPTIME
TOPICAL | Status: DC | PRN
  Administered 2023-09-19: 1000 mL

## 2023-09-19 MED ORDER — FENTANYL CITRATE (PF) 100 MCG/2ML IJ SOLN
INTRAMUSCULAR | Status: AC
Start: 2023-09-19 — End: 2023-09-19
  Filled 2023-09-19: qty 2

## 2023-09-19 MED ORDER — PHENYLEPHRINE 80 MCG/ML (10ML) SYRINGE FOR IV PUSH (FOR BLOOD PRESSURE SUPPORT)
PREFILLED_SYRINGE | INTRAVENOUS | Status: DC | PRN
  Administered 2023-09-19 (×2): 80 ug via INTRAVENOUS
  Administered 2023-09-19 (×2): 40 ug via INTRAVENOUS

## 2023-09-19 MED ORDER — FENTANYL CITRATE (PF) 250 MCG/5ML IJ SOLN
INTRAMUSCULAR | Status: DC | PRN
  Administered 2023-09-19: 50 ug via INTRAVENOUS

## 2023-09-19 MED ORDER — EPHEDRINE SULFATE-NACL 50-0.9 MG/10ML-% IV SOSY
PREFILLED_SYRINGE | INTRAVENOUS | Status: DC | PRN
  Administered 2023-09-19 (×2): 10 mg via INTRAVENOUS
  Administered 2023-09-19: 5 mg via INTRAVENOUS

## 2023-09-19 MED ORDER — OXYCODONE HCL 5 MG PO TABS
5.0000 mg | ORAL_TABLET | Freq: Once | ORAL | Status: AC | PRN
  Administered 2023-09-19: 5 mg via ORAL

## 2023-09-19 MED ORDER — CHLORHEXIDINE GLUCONATE 0.12 % MT SOLN
OROMUCOSAL | Status: AC
  Administered 2023-09-19: 15 mL via OROMUCOSAL
  Filled 2023-09-19: qty 15

## 2023-09-19 MED ORDER — GLYCOPYRROLATE 0.2 MG/ML IJ SOLN
INTRAMUSCULAR | Status: DC | PRN
  Administered 2023-09-19: .2 mg via INTRAVENOUS

## 2023-09-19 MED ORDER — SODIUM CHLORIDE 0.9% FLUSH
10.0000 mL | Freq: Two times a day (BID) | INTRAVENOUS | Status: DC
  Administered 2023-09-19 – 2023-09-22 (×6): 10 mL

## 2023-09-19 MED ORDER — PROPOFOL 500 MG/50ML IV EMUL
INTRAVENOUS | Status: DC | PRN
  Administered 2023-09-19: 100 ug/kg/min via INTRAVENOUS

## 2023-09-19 MED ORDER — METOCLOPRAMIDE HCL 5 MG PO TABS: 5.0000 mg | ORAL_TABLET | Freq: Three times a day (TID) | ORAL | Status: DC | PRN

## 2023-09-19 MED ORDER — ERTAPENEM IV (FOR PTA / DISCHARGE USE ONLY): 1.0000 g | INTRAVENOUS | 0 refills | Status: DC

## 2023-09-19 MED ORDER — FENTANYL CITRATE (PF) 100 MCG/2ML IJ SOLN
25.0000 ug | INTRAMUSCULAR | Status: DC | PRN
  Administered 2023-09-19 (×2): 50 ug via INTRAVENOUS

## 2023-09-19 MED ORDER — VANCOMYCIN HCL 1000 MG IV SOLR
INTRAVENOUS | Status: AC
Start: 2023-09-19 — End: 2023-09-19
  Filled 2023-09-19: qty 20

## 2023-09-19 MED ORDER — FENTANYL CITRATE (PF) 250 MCG/5ML IJ SOLN
INTRAMUSCULAR | Status: AC
  Filled 2023-09-19: qty 5

## 2023-09-19 SURGICAL SUPPLY — 7 items
BNDG ELASTIC 4INX 5YD STR LF (GAUZE/BANDAGES/DRESSINGS) IMPLANT
BNDG ELASTIC 6INX 5YD STR LF (GAUZE/BANDAGES/DRESSINGS) IMPLANT
DRSG MEPITEL 4X7.2 (GAUZE/BANDAGES/DRESSINGS) IMPLANT
GAUZE SPONGE 4X4 12PLY STRL (GAUZE/BANDAGES/DRESSINGS) IMPLANT
PAD ABD 8X10 STRL (GAUZE/BANDAGES/DRESSINGS) IMPLANT
PADDING CAST ABS COTTON 4X4 ST (CAST SUPPLIES) IMPLANT
PADDING CAST COTTON 6X4 STRL (CAST SUPPLIES) IMPLANT

## 2023-09-19 NOTE — Plan of Care (Signed)
  Problem: Education: Goal: Ability to describe self-care measures that may prevent or decrease complications (Diabetes Survival Skills Education) will improve Outcome: Progressing   Problem: Coping: Goal: Ability to adjust to condition or change in health will improve Outcome: Progressing   Problem: Health Behavior/Discharge Planning: Goal: Ability to identify and utilize available resources and services will improve Outcome: Progressing   Problem: Metabolic: Goal: Ability to maintain appropriate glucose levels will improve Outcome: Progressing   Problem: Nutritional: Goal: Maintenance of adequate nutrition will improve Outcome: Progressing   Problem: Skin Integrity: Goal: Risk for impaired skin integrity will decrease Outcome: Progressing   Problem: Tissue Perfusion: Goal: Adequacy of tissue perfusion will improve Outcome: Progressing

## 2023-09-19 NOTE — Interval H&P Note (Signed)
 History and Physical Interval Note:  09/19/2023 8:42 AM  Harold [name]  has presented today for surgery, with the diagnosis of Right ankle infection.  The various methods of treatment have been discussed with the patient and family. After consideration of risks, benefits and other options for treatment, the patient has consented to  Procedure(s): REMOVAL, HARDWARE ANKLE (Right) IRRIGATION AND DEBRIDEMENT WOUND (Right) as a surgical intervention.  The patient's history has been reviewed, patient examined, no change in status, stable for surgery.  I have reviewed the patient's chart and labs.  Questions were answered to the patient's satisfaction.     Zenith Lamphier P [name]

## 2023-09-19 NOTE — Anesthesia Postprocedure Evaluation (Signed)
 Anesthesia Post Note  Patient: Harold [name]  Procedure(s) Performed: IRRIGATION AND DEBRIDEMENT WOUND (Right)     Patient location during evaluation: PACU Anesthesia Type: General Level of consciousness: awake and alert Pain management: pain level controlled Vital Signs Assessment: post-procedure vital signs reviewed and stable Respiratory status: spontaneous breathing, nonlabored ventilation, respiratory function stable and patient connected to nasal cannula oxygen Cardiovascular status: blood pressure returned to baseline and stable Postop Assessment: no apparent nausea or vomiting Anesthetic complications: no   No notable events documented.  Last Vitals:  Vitals:   09/19/23 1030 09/19/23 1045  BP: (!) 119/57 (!) 125/55  Pulse: 65 61  Resp: 17 14  Temp:    SpO2: 99% 96%    Last Pain:  Vitals:   09/19/23 1030  TempSrc:   PainSc: Asleep                 [name]

## 2023-09-19 NOTE — Progress Notes (Signed)
 OT Cancellation Note  Patient Details Name: Harold [name] MRN: [id number] DOB: 08/13/1955   Cancelled Treatment:    Reason Eval/Treat Not Completed: Patient at procedure or test/ unavailable.  Will check back as able.    CHRISTELLA Nest Lorraine-COTA/L  09/19/2023, 10:22 AM

## 2023-09-19 NOTE — Progress Notes (Addendum)
 HD#3 SUBJECTIVE:  Patient Summary: Harold [name] is a 68 y.o. person living with a history of peripheral vascular disease, T2DM, CKD3b, HLD, HTN and chronic septic right ankle joint s/p mulitple surgeries and abx courses since Nov 2024 who presented with right ankle pain with accompanying acute wounds and admitted for septic right ankle    Overnight Events: Pt and partner do not want hardware removal, only irrigation and debridement, this isn't possible per DR Haddick. NPO order canceled, diet resumed. Pt seems confused although AO x3, call partner every time provider enters room.   Interim History: Patient feels a little fuzzy still from surgery. His back is itchy, the top and bottom. First noticed this last night.  He doesn't have an appetite yet but does not want a heart healthy diet, specifically no [place] substitutes. He denies SOB, chest pain, changes in vision, burning with urination, and abdominal pain.  He continues to have pain in his leg. Discussed that we will talk to surgery about the plan for discharge. Patient understands, that when he does leave, he will have a PICC line for IV abx.   OBJECTIVE:  Vital Signs: Vitals:   09/18/23 1145 09/18/23 1624 09/18/23 2051 09/19/23 0359  BP: (!) 187/74 (!) 134/91 (!) 144/62 (!) 153/65  Pulse: 73 73 67 61  Resp:   18 18  Temp:  98 F (36.7 C) 98.6 F (37 C) 97.6 F (36.4 C)  TempSrc:   Oral Axillary  SpO2: 100% 96% 99% 98%  Weight:      Height:       Supplemental O2: Room Air SpO2: 98 %  Filed Weights   09/16/23 1234  Weight: 102.5 kg     Intake/Output Summary (Last 24 hours) at 09/19/2023 9385 Last data filed at 09/19/2023 0345 Gross per 24 hour  Intake 166 ml  Output 800 ml  Net -634 ml   Net IO Since Admission: -734 mL [09/19/23 0614]  Physical Exam: Physical Exam Constitutional:      Appearance: Normal appearance.  HENT:     Nose: No congestion or rhinorrhea.     Mouth/Throat:     Mouth: Mucous  membranes are moist.     Pharynx: Oropharynx is clear.  Eyes:     Conjunctiva/sclera: Conjunctivae normal.  Cardiovascular:     Rate and Rhythm: Normal rate and regular rhythm.  Pulmonary:     Effort: Pulmonary effort is normal.     Breath sounds: Normal breath sounds.  Abdominal:     General: Bowel sounds are normal.     Palpations: Abdomen is soft.     Tenderness: There is no abdominal tenderness. There is no guarding or rebound.  Musculoskeletal:     Comments: Right ankle in bandage s/p surgery Sensation intact to right pedal digits   Skin:    General: Skin is warm and dry.     Comments: Back skin unremarkable for rash   Neurological:     General: No focal deficit present.     Patient Lines/Drains/Airways Status     Active Line/Drains/Airways     Name Placement date Placement time Site Days   Peripheral IV 09/16/23 20 G Left Antecubital 09/16/23  1252  Antecubital  3   External Urinary Catheter 09/16/23  2100  --  3   Pressure Injury 06/20/23 Sacrum Right Unstageable - Full thickness tissue loss in which the base of the injury is covered by slough (yellow, tan, gray, green or brown) and/or eschar (tan, brown  or black) in the wound bed. 06/20/23  0050  -- 91   Wound / Incision (Open or Dehisced) 06/20/23 Incision - Open;Other (Comment) Ankle Lateral;Right 06/20/23  0045  Ankle  91   Wound / Incision (Open or Dehisced) 07/18/23 Arm Left;Lower;Posterior 07/18/23  0524  Arm  63   Wound / Incision (Open or Dehisced) 07/30/23 Non-pressure wound Heel Right 07/30/23  1540  Heel  51   Wound / Incision (Open or Dehisced) 07/30/23 Foot Right;Posterior 07/30/23  1540  Foot  51            Pertinent labs and imaging:  7/31 CK total 19, CRP 7.6, ESR 103    Latest Ref Rng & Units 09/19/2023    4:39 AM 09/18/2023   11:21 AM 09/17/2023    5:12 AM  CBC  WBC 4.0 - 10.5 K/uL 7.4  12.1  7.0   Hemoglobin 13.0 - 17.0 g/dL 7.6  8.9  7.3   Hematocrit 39.0 - 52.0 % 23.5  27.1  22.4    Platelets 150 - 400 K/uL 270  302  290        Latest Ref Rng & Units 09/19/2023    4:39 AM 09/18/2023   11:21 AM 09/17/2023    5:12 AM  CMP  Glucose 70 - 99 mg/dL 850  823  782   BUN 8 - 23 mg/dL 13  14  18    Creatinine 0.61 - 1.24 mg/dL 8.26  8.10  8.41   Sodium 135 - 145 mmol/L 136  136  134   Potassium 3.5 - 5.1 mmol/L 3.6  4.2  3.7   Chloride 98 - 111 mmol/L 106  105  104   CO2 22 - 32 mmol/L 21  20  21    Calcium  8.9 - 10.3 mg/dL 8.3  8.5  8.2     No results found.  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 [facility]) Active Problems:   Diabetes mellitus type 2 with neurological manifestations (HCC)   Essential hypertension   Neuropathy   Hyperlipidemia associated with type 2 diabetes mellitus (HCC)   CAD, LAD/CFX DES 04/28/12- staged RCA DES 04/29/12 after pt declined CABG, cath 05/14/13 stable CAD medical therapy   COPD (chronic obstructive pulmonary disease) (HCC)   Chronic heart failure with preserved ejection fraction (HFpEF) (HCC)   Gastroesophageal reflux disease without esophagitis   PVD (peripheral vascular disease) (HCC)   CKD stage 3b, GFR 30-44 ml/min (HCC)   Normocytic anemia   PAD (peripheral artery disease) (HCC)   Type 2 diabetes mellitus with diabetic nephropathy, with long-term current use of insulin  (HCC)   Stage 3a chronic kidney disease (HCC)   Type 2 diabetes mellitus with diabetic polyneuropathy, with long-term current use of insulin  (HCC)   Plan: #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  -Hardware removed by Dr. Kendal 06/21/2023. Hospital discharge on 07/22/23 with chronic septic joint completed ertapenem  and daptomycin  on 07/31/2023.  -Right digits are swollen, weeping skins  lesions needing repeated dressing changes.  -MRI 7/29: Suspicious for OM in distal tibia, talus, and calcaneus. Suspected septic tibiotalar, distal tibiofibular, and subtalar joints. -pending 7/29 blood cultures, no growth  at 2 days   -IV Linezolid  and change to cefepime  (7/30) until 7/31. Switched to IV dapto and ertapenem  on  7/31. Monitor CK and eosinophils.  -7/31 CK 19, ESR 103, CRP 7.6 -WBC wnl and patient afebrile 8/1 -Successful Irrigation and debridement on 8/1. Patient weightbearing as tolerated.  -Plan to resume anticoagulation/platelet 8/2 -gabapentin , tylenol , oxycodone , and hydromorphone  orders in for pain control    #Peripheral Vascular Disease -Follows with Dr. Darron who in Aug 2021 did successful balloon angioplasty to tibioperoneal trunk in posterior tibial artery with drug-coated angioplasty. In sept 2024, patient had reccurent wounds on left leg which were healed after Dr. Darron did successful orbital atherectomy and drug-coated balloon angioplasty to left popliteal artery, TP trunk and posterior tibial artery.  -After fall in Nov 2024, Dr. Serene did Angiogram which showed calcified stenosis in distal right SFA, treated with Eluvia drug-eluting stent.  -OT and PT seeing  -Plan to resume anticoagulation/platelet 8/2   #Qtc prolongation #Paroxysmal atrial fibrillation  -Afib diagnosed in 2023, refractory to [facility] on 03/13/22 and 03/27/22, Dr. Nancey transitioned to medical therapy starting amio -Hospitalization in June 2025, patient had Qtc prolongation (likely due to polypharmacy with prolong drugs) treated with amiodarone  with recommendations to avoid further Qtc prolong medications -Continue home amiodarone  200 mg twice daily -Plan to resume anticoagulation/platelet 8/2   #Diabetes Mellitus Type 2 -A1c 5.2% on 5/30 -glucose 235 on admission, 149 today  -goal sugars <180 for wound healing optimization -sliding scale   -glargine 8 units daily (increased from 5 units on  7/30), aspart 0-15   #Chronic Kidney Disease Stage 3b -patient denies symptoms concerning for infection, has been urinating normally  -has had dialysis in the past but not currently  -8/1 Cr 1.73, BUN 13 -monitor electrolytes and urine output   #Coronary artery disease s/p CABG -home meds: apixaban  5 mg twice daily -Plan to resume anticoagulation/platelet 8/2 -Resume home Lipitor 40 mg    #Chronic anemia -7/29 on admission hgb 8.1, 7.3 today -anticipate some blood loss with surgery, ordered H/H on 8/1 at 1400 to reassess  -monitor CBC closely    #Chronic HTN -Carvedilol  discontinued in July due to concern for orthostatic hypotension -blood pressures have been high, this morning 153/65 -Continue home losartan  25 mg    #OSA on CPAP -continue CPAP   #GAD #Depression -duloxetine  20 mg -trazodone  50 mg   #BPH -finasteride  5 mg   #GERD -continue pantoprazole  40 mg tablet   #COPD   #Hypothyroidism -continue home levothyroxine  88 mcg     Best Practice: Diet: Diabetic diet VTE: Resume A/C A/P 8/2 Code: Full  Disposition planning: Therapy Recs: SNF,  Family Contact: Zachary, called and notified. DISPO: Anticipated discharge tomorrow to Skilled nursing facility pending Insurance for SNF coverage.  Signature:  Viktoria Charmayne [facility]  6:14 AM, 09/19/2023  On Call pager [phone]

## 2023-09-19 NOTE — Transfer of Care (Signed)
 Immediate Anesthesia Transfer of Care Note  Patient: Harold [name]  Procedure(s) Performed: IRRIGATION AND DEBRIDEMENT WOUND (Right)  Patient Location: PACU  Anesthesia Type:General  Level of Consciousness: awake, alert , and oriented  Airway & Oxygen Therapy: Patient Spontanous Breathing and Patient connected to face mask oxygen  Post-op Assessment: Report given to RN and Post -op Vital signs reviewed and stable  Post vital signs: Reviewed and stable  Last Vitals:  Vitals Value Taken Time  BP 103/59 09/19/23 10:05  Temp 36.7 C 09/19/23 10:05  Pulse 62 09/19/23 10:08  Resp 11 09/19/23 10:08  SpO2 100 % 09/19/23 10:08  Vitals shown include unfiled device data.  Last Pain:  Vitals:   09/19/23 1005  TempSrc:   PainSc: Asleep         Complications: No notable events documented.

## 2023-09-19 NOTE — Op Note (Signed)
 Orthopaedic Surgery Operative Note (CSN: [id number] ) Date of Surgery: 09/19/2023  Admit Date: 09/16/2023   Diagnoses: Pre-Op Diagnoses: Right ankle hardware associated infection with draining sinuses  Post-Op Diagnosis: Same  Procedures: CPT 28005-Incision and drainage right foot/ankle osteomyelitis  Surgeons : Primary: Kendal Franky SQUIBB, MD  Assistant: Lauraine Moores, PA-C  Location: OR 3   Anesthesia: General   Antibiotics: Scheduled IV antibiotics and 1 gm topically antibiotics   Tourniquet time: None    Estimated Blood Loss: None  Complications:* No complications entered in OR log *   Specimens: ID Type Source Tests Collected by Time Destination  A : right ankle wound #1 Wound Abscess AEROBIC CULTURE W GRAM STAIN (SUPERFICIAL SPECIMEN) Kendal Franky SQUIBB, MD 09/19/2023 [phone]   B : right ankle wound #2 Wound Abscess AEROBIC CULTURE W GRAM STAIN (SUPERFICIAL SPECIMEN) Kendal Franky SQUIBB, MD 09/19/2023 [phone]      Implants: * No implants in log *   Indications for Surgery: 68 year old male who had a right ankle fracture that developed postoperative infection and underwent hardware removal treatment with antibiotics.  Unfortunately he had another fall where he sustained a refracture through the place that it had fractured prior.  He underwent external fixation and I&D and then subsequent TTC nailing.  He developed a postoperative infection which was treated with I&D and removal of hardware in May.  He has been suppressed with antibiotics and he developed a worsening blistering and drainage from his foot.  MRI showed significant infection around the hardware with sinus tract formation from the ankle joint and calcaneus.  I discussed risks of nonoperative management versus surgical intervention.  I had recommended either an I&D with removal of hardware or potential below-knee amputation.  Due to his refracture this patient and his partner are very hesitant to remove the hardware and were only  agreeable to an I&D.  I discussed with him that hardware removal would likely need to be needed later on and the potential need for an amputation.  They agreed to proceed with just I&D and consent was obtained.  Operative Findings: 1.  Incision and drainage of right ankle/calcaneus and tibia osteomyelitis with irrigation. 2.  Three sinus tracts were debrided at the medial ankle the posterior calcaneus and the plantar aspect of the foot.  Procedure: The patient was identified in the preoperative holding area. Consent was confirmed with the patient and their family and all questions were answered. The operative extremity was marked after confirmation with the patient. he was then brought back to the operating room by our anesthesia colleagues.  He was carefully transferred over to radiolucent flattop table.  A bump was placed under his operative hip.  He was placed under general anesthetic.  The right lower extremity was then prepped and draped in usual sterile fashion.  A timeout was performed verifying patient, the procedure, and the extremity.  Preoperative antibiotics were dosed.  Extended the areas of sinus tract formation.  1 was medially at the ankle.  The other 1 was from previous site where one of his interlocking screws for the TTC nail was in the plantar aspect of the foot.  I was able to use a curette to debride the bone as well as the soft tissue in these areas and went all the way down to the hardware.  I then took cultures from the soft tissue and exudative material that was present.  We sent these to the lab.  I then irrigated the sinus tracts with 3 L  normal saline using cystoscopy tubing.  A gram of vancomycin  powder was then placed into the sinus tracts and sterile dressings were applied.  The patient was then awoke from anesthesia and taken to the PACU in stable condition.   Debridement type: Excisional Debridement  Side: right  Body Location: Ankle   Tools used for debridement:  curette  Pre-debridement Wound size (cm):   Please see operative note above, sinus tracts probe to hardware and are less than 1-1.5cm in width  Post-debridement Wound size (cm):  Same  Debridement depth beyond dead/damaged tissue down to healthy viable tissue: yes  Tissue layer involved: skin, subcutaneous tissue, muscle / fascia, bone  Nature of tissue removed: Purulence  Irrigation volume: 3L     Irrigation fluid type: Normal Saline   Post Op Plan/Instructions: Patient will be weightbearing as tolerated to the right lower extremity.  He will receive broad-spectrum antibiotics under the discretion of infectious disease.  Monitor for his response.  No further surgeries planned from my standpoint.  I was present and performed the entire surgery.  Lauraine Moores, PA-C did assist me throughout the case. An assistant was necessary given the difficulty in approach, maintenance of reduction and ability to instrument the fracture.   Franky Light, MD [facility]

## 2023-09-19 NOTE — Plan of Care (Signed)
 Patient informed nurse that he was not in agreement with having any procedure at this time. I notified the on-call team and they came bedside to speak with patient. Problem: Education: Goal: Ability to describe self-care measures that may prevent or decrease complications (Diabetes Survival Skills Education) will improve Outcome: Progressing Goal: Individualized Educational Video(s) Outcome: Progressing   Problem: Coping: Goal: Ability to adjust to condition or change in health will improve Outcome: Progressing   Problem: Fluid Volume: Goal: Ability to maintain a balanced intake and output will improve Outcome: Progressing   Problem: Health Behavior/Discharge Planning: Goal: Ability to identify and utilize available resources and services will improve Outcome: Progressing Goal: Ability to manage health-related needs will improve Outcome: Progressing   Problem: Metabolic: Goal: Ability to maintain appropriate glucose levels will improve Outcome: Progressing   Problem: Nutritional: Goal: Maintenance of adequate nutrition will improve Outcome: Progressing Goal: Progress toward achieving an optimal weight will improve Outcome: Progressing   Problem: Skin Integrity: Goal: Risk for impaired skin integrity will decrease Outcome: Progressing   Problem: Tissue Perfusion: Goal: Adequacy of tissue perfusion will improve Outcome: Progressing   Problem: Education: Goal: Knowledge of General Education information will improve Description: Including pain rating scale, medication(s)/side effects and non-pharmacologic comfort measures Outcome: Progressing   Problem: Health Behavior/Discharge Planning: Goal: Ability to manage health-related needs will improve Outcome: Progressing   Problem: Clinical Measurements: Goal: Ability to maintain clinical measurements within normal limits will improve Outcome: Progressing Goal: Will remain free from infection Outcome: Progressing Goal:  Diagnostic test results will improve Outcome: Progressing Goal: Respiratory complications will improve Outcome: Progressing Goal: Cardiovascular complication will be avoided Outcome: Progressing   Problem: Activity: Goal: Risk for activity intolerance will decrease Outcome: Progressing   Problem: Nutrition: Goal: Adequate nutrition will be maintained Outcome: Progressing   Problem: Coping: Goal: Level of anxiety will decrease Outcome: Progressing   Problem: Elimination: Goal: Will not experience complications related to bowel motility Outcome: Progressing Goal: Will not experience complications related to urinary retention Outcome: Progressing   Problem: Pain Managment: Goal: General experience of comfort will improve and/or be controlled Outcome: Progressing   Problem: Safety: Goal: Ability to remain free from injury will improve Outcome: Progressing   Problem: Skin Integrity: Goal: Risk for impaired skin integrity will decrease Outcome: Progressing

## 2023-09-19 NOTE — Progress Notes (Addendum)
 Diagnosis: Recurrent/chronic rle hardware associated om   Culture Result: 8/1 operative cx pending  Previous wound cx enterobacter and staph epi   Abx and f/u as below Maintain standard isolation precaution in house Ok to discharge from id standpoint and we can f/u culture and adjust abx outpatient as needed   Allergies  Allergen Reactions   Beta-Lactamase Inhibitors (Beta-Lactam) Hives, Itching and Rash    AX - penicillin     Ancef  given 9/24 with no obvious reaction   Other Nausea And Vomiting    General Anesthesia - sometimes causes nausea and vomiting * OK to use scopolamine  patch     Wellbutrin [Bupropion] Other (See Comments)    Mood Changes   Metformin  And Related Diarrhea and Nausea Only   Sulfa Antibiotics Hives and Other (See Comments)    Bactrim   Tetracyclines & Related Hives and Rash    doxycycline     OPAT Orders Discharge antibiotics to be given via PICC line Discharge antibiotics: Daptomycin  Ertapenem   Duration: 6 weeks from 8/1 End Date: 10/31/23  [facility] Care Per Protocol:  Home health RN for IV administration and teaching; PICC line care and labs.    Labs weekly while on IV antibiotics: _x_ CBC with differential __ BMP _x_ CMP _x_ CRP _x_ ESR __ Vancomycin  trough _x_ CK  _x_ Please pull PIC at completion of IV antibiotics __ Please leave PIC in place until doctor has seen patient or been notified  Fax weekly labs to [phone]  Clinic Follow Up Appt: 8/20 @ 10am (nothing available earlier)  @  [facility] [address], [place], [place] [address] Phone: [phone]        ---------- Subjective: Refused hardware removal; dr Kendal to do I&D as much as he can Team planning discharge   Exam: In surgery   Labs: Lab Results  Component Value Date   WBC 7.4 09/19/2023   HGB 7.6 (L) 09/19/2023   HCT 23.5 (L) 09/19/2023   MCV 88.7 09/19/2023   PLT 270 09/19/2023   Last metabolic panel Lab Results  Component  Value Date   GLUCOSE 149 (H) 09/19/2023   NA 136 09/19/2023   K 3.6 09/19/2023   CL 106 09/19/2023   CO2 21 (L) 09/19/2023   BUN 13 09/19/2023   CREATININE 1.73 (H) 09/19/2023   GFRNONAA 42 (L) 09/19/2023   CALCIUM  8.3 (L) 09/19/2023   PHOS 3.3 07/17/2023   PROT 5.3 (L) 09/16/2023   ALBUMIN  1.8 (L) 09/16/2023   LABGLOB 2.2 04/16/2023   AGRATIO 1.7 04/16/2023   BILITOT 0.4 09/16/2023   ALKPHOS 91 09/16/2023   AST 14 (L) 09/16/2023   ALT 11 09/16/2023   ANIONGAP 9 09/19/2023   I spent more than 15 minute reviewing data/chart, and coordinating care, providing diagnostics/treatment plan with treatment team         Constance ONEIDA Passer, MD [facility] [phone]  pager   [phone] cell 09/19/2023, 1:48 PM

## 2023-09-19 NOTE — Discharge Instructions (Addendum)
 Orthopaedic Trauma Service Discharge Instructions   General Discharge Instructions  WEIGHT BEARING STATUS:Weightbearing as tolerated right lower extremity  RANGE OF MOTION/ACTIVITY:As tolerated  Wound Care: You may remove your surgical dressing on post op day #3 (Monday 09/22/23). Incisions can be left open to air if there is no drainage. Once the incision is completely dry and without drainage, it may be left open to air out.  Showering may begin post op day #4 (Tuesday 09/23/23).  Clean incision gently with soap and water.  DVT/PE prophylaxis: You may resume your home dose anticoagulation starting Monday 09/22/23  Diet: as you were eating previously.  Can use over the counter stool softeners and bowel preparations, such as Miralax , to help with bowel movements.  Narcotics can be constipating.  Be sure to drink plenty of fluids  PAIN MEDICATION USE AND EXPECTATIONS  You have likely been given narcotic medications to help control your pain.  After a traumatic event that results in an fracture (broken bone) with or without surgery, it is ok to use narcotic pain medications to help control one's pain.  We understand that everyone responds to pain differently and each individual patient will be evaluated on a regular basis for the continued need for narcotic medications. Ideally, narcotic medication use should last no more than 6-8 weeks (coinciding with fracture healing).   As a patient it is your responsibility as well to monitor narcotic medication use and report the amount and frequency you use these medications when you come to your office visit.   We would also advise that if you are using narcotic medications, you should take a dose prior to therapy to maximize you participation.  IF YOU ARE ON NARCOTIC MEDICATIONS IT IS NOT PERMISSIBLE TO OPERATE A MOTOR VEHICLE (MOTORCYCLE/CAR/TRUCK/MOPED) OR HEAVY MACHINERY DO NOT MIX NARCOTICS WITH OTHER CNS (CENTRAL NERVOUS SYSTEM) DEPRESSANTS SUCH AS  ALCOHOL  POST-OPERATIVE OPIOID TAPER INSTRUCTIONS: It is important to wean off of your opioid medication as soon as possible. If you do not need pain medication after your surgery it is ok to stop day one. Opioids include: Codeine, Hydrocodone (Norco, Vicodin), Oxycodone (Percocet, oxycontin ) and hydromorphone  amongst others.  Long term and even short term use of opiods can cause: Increased pain response Dependence Constipation Depression Respiratory depression And more.  Withdrawal symptoms can include Flu like symptoms Nausea, vomiting And more Techniques to manage these symptoms Hydrate well Eat regular healthy meals Stay active Use relaxation techniques(deep breathing, meditating, yoga) Do Not substitute Alcohol to help with tapering If you have been on opioids for less than two weeks and do not have pain than it is ok to stop all together.  Plan to wean off of opioids This plan should start within one week post op of your fracture surgery  Maintain the same interval or time between taking each dose and first decrease the dose.  Cut the total daily intake of opioids by one tablet each day Next start to increase the time between doses. The last dose that should be eliminated is the evening dose.    STOP SMOKING OR USING NICOTINE PRODUCTS!!!!  As discussed nicotine severely impairs your body's ability to heal surgical and traumatic wounds but also impairs bone healing.  Wounds and bone heal by forming microscopic blood vessels (angiogenesis) and nicotine is a vasoconstrictor (essentially, shrinks blood vessels).  Therefore, if vasoconstriction occurs to these microscopic blood vessels they essentially disappear and are unable to deliver necessary nutrients to the healing tissue.  This is one  modifiable factor that you can do to dramatically increase your chances of healing your injury.  (This means no smoking, no nicotine gum, patches, etc)  DO NOT USE NONSTEROIDAL  ANTI-INFLAMMATORY DRUGS (NSAID'S)  Using products such as Advil (ibuprofen), Aleve (naproxen), Motrin (ibuprofen) for additional pain control during fracture healing can delay and/or prevent the healing response.  If you would like to take over the counter (OTC) medication, Tylenol  (acetaminophen ) is ok.  However, some narcotic medications that are given for pain control contain acetaminophen  as well. Therefore, you should not exceed more than 4000 mg of tylenol  in a day if you do not have liver disease.  Also note that there are may OTC medicines, such as cold medicines and allergy medicines that my contain tylenol  as well.  If you have any questions about medications and/or interactions please ask your doctor/PA or your pharmacist.      ICE AND ELEVATE INJURED/OPERATIVE EXTREMITY  Using ice and elevating the injured extremity above your heart can help with swelling and pain control.  Icing in a pulsatile fashion, such as 20 minutes on and 20 minutes off, can be followed.    Do not place ice directly on skin. Make sure there is a barrier between to skin and the ice pack.    Using frozen items such as frozen peas works well as the conform nicely to the are that needs to be iced.  USE AN ACE WRAP OR TED HOSE FOR SWELLING CONTROL  In addition to icing and elevation, Ace wraps or TED hose are used to help limit and resolve swelling.  It is recommended to use Ace wraps or TED hose until you are informed to stop.    When using Ace Wraps start the wrapping distally (farthest away from the body) and wrap proximally (closer to the body)   Example: If you had surgery on your leg or thing and you do not have a splint on, start the ace wrap at the toes and work your way up to the thigh        If you had surgery on your upper extremity and do not have a splint on, start the ace wrap at your fingers and work your way up to the upper arm   CALL THE OFFICE FOR MEDICATION REFILLS OR WITH ANY QUESTIONS/CONCERNS:  [phone]   VISIT OUR WEBSITE FOR ADDITIONAL INFORMATION: [email or web address]    Discharge Wound Care Instructions  Do NOT apply any ointments, solutions or lotions to pin sites or surgical wounds.  These prevent needed drainage and even though solutions like hydrogen peroxide kill bacteria, they also damage cells lining the pin sites that help fight infection.  Applying lotions or ointments can keep the wounds moist and can cause them to breakdown and open up as well. This can increase the risk for infection. When in doubt call the office.  Surgical incisions should be dressed daily.  If any drainage is noted, use one layer of adaptic or Mepitel, then gauze, Kerlix, and an ace wrap. - These dressing supplies should be available at local medical supply stores ([employer], [facility], etc) as well as Insurance claims handler ([facility], [facility], [employer], etc)  Once the incision is completely dry and without drainage, it may be left open to air out.  Showering may begin 36-48 hours later.  Cleaning gently with soap and water.  Traumatic wounds should be dressed daily as well.    One layer of adaptic, gauze, Kerlix, then ace wrap.  The  adaptic can be discontinued once the draining has ceased    If you have a wet to dry dressing: wet the gauze with saline the squeeze as much saline out so the gauze is moist (not soaking wet), place moistened gauze over wound, then place a dry gauze over the moist one, followed by Kerlix wrap, then ace wrap.    Call office for the following: Temperature greater than 101F Persistent nausea and vomiting Severe uncontrolled pain Redness, tenderness, or signs of infection (pain, swelling, redness, odor or green/yellow discharge around the site) Difficulty breathing, headache or visual disturbances Hives Persistent dizziness or light-headedness Extreme fatigue Any other questions or concerns you may have after discharge  In an emergency, call 911 or go to an  Emergency Department at a nearby hospital  OTHER HELPFUL INFORMATION  If you had a block, it will wear off between 8-24 hrs postop typically.  This is period when your pain may go from nearly zero to the pain you would have had postop without the block.  This is an abrupt transition but nothing dangerous is happening.  You may take an extra dose of narcotic when this happens.  You should wean off your narcotic medicines as soon as you are able.  Most patients will be off or using minimal narcotics before their first postop appointment.   We suggest you use the pain medication the first night prior to going to bed, in order to ease any pain when the anesthesia wears off. You should avoid taking pain medications on an empty stomach as it will make you nauseous.  Do not drink alcoholic beverages or take illicit drugs when taking pain medications.  In most states it is against the law to drive while you are in a splint or sling.  And certainly against the law to drive while taking narcotics.  You may return to work/school in the next couple of days when you feel up to it.   Pain medication may make you constipated.  Below are a few solutions to try in this order: Decrease the amount of pain medication if you aren't having pain. Drink lots of decaffeinated fluids. Drink prune juice and/or each dried prunes  If the first 3 don't work start with additional solutions Take Colace - an over-the-counter stool softener Take Senokot - an over-the-counter laxative Take Miralax  - a stronger over-the-counter laxative  Thank you for allowing the Internal Medicine Team to be part of your care. You were hospitalized for osteomyelitis. We treated you with antibiotics and pain medicine, in addition to the surgery you have with orthopedic surgery.   See the changes in your medications and management of your chronic conditions below:  *For your osteomyelitis -We have STARTED you on these following  medications:  - daptomycin , an antibiotic   - ertapenem , an antibiotic   -We have not stopped any of your at home medications. Please continue taking these as prescribed.   FOLLOW UP APPOINTMENTS:  Please visit Dr. Overton (infectious disease doctor) in on August 20th. Please call and make an appointment (in two weeks) with your orthopedic surgery team to follow up on your surgery and for amputation education.   Please make sure to monitor your PICC line for signs of infection (redness, pain, swelling, oozing).   Please call your PCP or our clinic if you have any questions or concerns, we may be able to help and keep you from a long and expensive emergency room wait. Our clinic and after hours  phone number is [phone]. The best time to call is Monday through Friday 9 am to 4 pm but there is always someone available 24/7 if you have an emergency. If you need medication refills please notify your pharmacy one week in advance and they will send us  a request.   We are glad you are feeling better,  Viktoria King Internal Medicine Inpatient Teaching Service at [facility]

## 2023-09-19 NOTE — TOC Progression Note (Signed)
 Transition of Care [facility]) - Progression Note    Patient Details  Name: Harold [name] MRN: [id number] Date of Birth: 1955-07-16  [facility] CM/SW Contact  Isaiah Public, LCSWA Phone Number: 09/19/2023, 12:53 PM  Clinical Narrative:     Patient came from [facility] short term. Patients plan is to return to [facility] when medically ready for dc. Rhonda with [facility] confirmed patient can return when medically stable for dc. CSW requested for Jon CMA to start insurance authorization for patient. CSW will continue to follow.  Expected Discharge Plan: Skilled Nursing Facility Barriers to Discharge: Continued Medical Work up               Expected Discharge Plan and Services In-house Referral: Clinical Social Work   Post Acute Care Choice: Skilled Nursing Facility Living arrangements for the past 2 months: Single Family Home, Skilled Nursing Facility                   DME Agency:  ([facility])         HH Agency: [facility] ([facility])         Social Drivers of Health (SDOH) Interventions SDOH Screenings   Food Insecurity: No Food Insecurity (09/16/2023)  Housing: Low Risk  (09/16/2023)  Recent Concern: Housing - High Risk (07/31/2023)  Transportation Needs: Unmet Transportation Needs (09/16/2023)  Utilities: Not At Risk (09/16/2023)  Depression (PHQ2-9): Low Risk  (07/29/2023)  Social Connections: Moderately Isolated (09/16/2023)  Tobacco Use: Medium Risk (09/19/2023)    Readmission Risk Interventions     No data to display

## 2023-09-19 NOTE — Progress Notes (Signed)
 Peripherally Inserted Central Catheter Placement  The IV Nurse has discussed with the patient and/or persons authorized to consent for the patient, the purpose of this procedure and the potential benefits and risks involved with this procedure.  The benefits include less needle sticks, lab draws from the catheter, and the patient may be discharged home with the catheter. Risks include, but not limited to, infection, bleeding, blood clot (thrombus formation), and puncture of an artery; nerve damage and irregular heartbeat and possibility to perform a PICC exchange if needed/ordered by physician.  Alternatives to this procedure were also discussed.  Bard Power PICC patient education guide, fact sheet on infection prevention and patient information card has been provided to patient /or left at bedside.    PICC Placement Documentation  PICC Single Lumen 09/19/23 Left Basilic 44 cm 0 cm (Active)  Indication for Insertion or Continuance of Line Prolonged intravenous therapies 09/19/23 1700  Exposed Catheter (cm) 0 cm 09/19/23 1700  Site Assessment Clean, Dry, Intact 09/19/23 1700  Line Status Flushed;Saline locked;Blood return noted 09/19/23 1700  Dressing Type Transparent;Securing device 09/19/23 1700  Dressing Status Antimicrobial disc/dressing in place;Clean, Dry, Intact 09/19/23 1700  Line Care Connections checked and tightened 09/19/23 1700  Line Adjustment (NICU/IV Team Only) No 09/19/23 1700  Dressing Intervention New dressing 09/19/23 1700  Dressing Change Due 09/26/23 09/19/23 1700       Ethyl Adonna Fuller 09/19/2023, 5:07 PM

## 2023-09-19 NOTE — Progress Notes (Signed)
 Ortho Note  Patient has been seen and examined this morning.  I have reviewed Dr. Chapman consult note regarding his treatment options.  There are not great oral treatment options for suppression antibiotics.  Initially I was relayed that the patient was agreeable to hardware removal and irrigation debridement.  However overnight the patient and his partner decided that they were not ready for hardware removal yet.  Although I do not feel that I can perform as good of the debridement I think it is reasonable to proceed with an I&D today.  I do feel that he will likely need the hardware removed eventually to adequately debride and remove the contaminated hardware.  He may still end up going on to need a below-knee amputation however the patient and his husband would like to prevent this at all costs and also try to retain the hardware to allow for continued weightbearing.  Will plan to proceed with I&D with the understanding that it is a less optimal procedure.  Franky MYRTIS Light, MD [facility] [phone] (office) [email or web address]

## 2023-09-19 NOTE — H&P (View-Only) (Signed)
 Ortho Note  Patient has been seen and examined this morning.  I have reviewed Dr. Chapman consult note regarding his treatment options.  There are not great oral treatment options for suppression antibiotics.  Initially I was relayed that the patient was agreeable to hardware removal and irrigation debridement.  However overnight the patient and his partner decided that they were not ready for hardware removal yet.  Although I do not feel that I can perform as good of the debridement I think it is reasonable to proceed with an I&D today.  I do feel that he will likely need the hardware removed eventually to adequately debride and remove the contaminated hardware.  He may still end up going on to need a below-knee amputation however the patient and his husband would like to prevent this at all costs and also try to retain the hardware to allow for continued weightbearing.  Will plan to proceed with I&D with the understanding that it is a less optimal procedure.  Franky MYRTIS Light, MD [facility] [phone] (office) [email or web address]

## 2023-09-19 NOTE — Anesthesia Procedure Notes (Signed)
 Procedure Name: LMA Insertion Date/Time: 09/19/2023 9:23 AM  Performed by: Vertie Arthea RAMAN, CRNAPre-anesthesia Checklist: Patient identified, Emergency Drugs available, Suction available and Patient being monitored Patient Re-evaluated:Patient Re-evaluated prior to induction Oxygen Delivery Method: Circle System Utilized Preoxygenation: Pre-oxygenation with 100% oxygen Induction Type: IV induction Ventilation: Mask ventilation without difficulty LMA: LMA inserted LMA Size: 5.0 Number of attempts: 1 Airway Equipment and Method: Bite block Placement Confirmation: positive ETCO2 Tube secured with: Tape Dental Injury: Teeth and Oropharynx as per pre-operative assessment

## 2023-09-19 NOTE — Progress Notes (Signed)
 PT Cancellation Note  Patient Details Name: Harold [name] MRN: [id number] DOB: 1956-01-24   Cancelled Treatment:    Reason Eval/Treat Not Completed: Patient at procedure or test/unavailable  Aleck Daring, PT, DPT Acute Rehabilitation Services Office [phone]    Alayne ONEIDA Daring 09/19/2023, 8:27 AM

## 2023-09-19 NOTE — Progress Notes (Signed)
 PHARMACY CONSULT NOTE FOR:  OUTPATIENT  PARENTERAL ANTIBIOTIC THERAPY (OPAT)  Indication: Osteomyelitis  Regimen: Daptomycin  800 mg IV Q 24 hours + Ertapenem  1 gm IV every 24 hours  End date: 10/31/23  IV antibiotic discharge orders are pended. To discharging provider:  please sign these orders via discharge navigator,  Select New Orders & click on the button choice - Manage This Unsigned Work.     Thank you for allowing pharmacy to be a part of this patient's care.  Damien Quiet, PharmD, BCPS, BCIDP Infectious Diseases Clinical Pharmacist Phone: [phone] 09/19/2023, 11:51 AM

## 2023-09-19 NOTE — Progress Notes (Signed)
 Orthopedic Tech Progress Note Patient Details:  Harold [name] 12/02/1955 [id number]  Patient ID: Harold [name], male   DOB: [birth year], 68 y.o.   MRN: [id number] No OHF. Age. Harold [name] 09/19/2023, 11:39 AM

## 2023-09-19 NOTE — Anesthesia Preprocedure Evaluation (Signed)
 Anesthesia Evaluation  Patient identified by MRN, date of birth, ID band Patient awake    Reviewed: Allergy & Precautions, NPO status , Patient's Chart, lab work & pertinent test results  History of Anesthesia Complications (+) PONV and history of anesthetic complications  Airway Mallampati: III  TM Distance: >3 FB Neck ROM: Full    Dental no notable dental hx. (+) Dental Advisory Given   Pulmonary sleep apnea and Continuous Positive Airway Pressure Ventilation , COPD, Patient abstained from smoking.Not current smoker, former smoker   Pulmonary exam normal breath sounds clear to auscultation       Cardiovascular Exercise Tolerance: Good METShypertension, Pt. on medications pulmonary hypertension+ CAD, + Cardiac Stents and + Peripheral Vascular Disease  (-) Past MI Normal cardiovascular exam+ dysrhythmias Atrial Fibrillation  Rhythm:Regular Rate:Normal - Systolic murmurs  '25 TTE - EF 55 to 60%. The left ventricle demonstrates regional wall motion abnormalities with basal inferior hypokinesis. The left ventricular internal cavity size was mildly dilated. Grade II diastolic dysfunction (pseudonormalization). The right ventricular size is mildly enlarged. There is mildly elevated pulmonary artery systolic pressure. The estimated right ventricular systolic pressure is 38.3 mmHg. Left atrial size was moderately dilated. Right atrial size was mildly dilated. Trivial MR.    Neuro/Psych  PSYCHIATRIC DISORDERS Anxiety Depression     Neuromuscular disease    GI/Hepatic Neg liver ROS,GERD  Medicated and Controlled,,  Endo/Other  diabetes, Type 2, Insulin  DependentHypothyroidism  No GLP1 use in weeks  Renal/GU CRFRenal disease     Musculoskeletal negative musculoskeletal ROS (+)    Abdominal   Peds  Hematology  (+) Blood dyscrasia, anemia  On eliquis  and plavix      Anesthesia Other Findings Past Medical History: No date:  Anemia 04/2013: Anginal pain (HCC)     Comment:  Cardiac cath showed patent stents with distal LAD,               circumflex-OM and RCA disease in small vessels. No date: Anxiety 12/2021: Atrial fibrillation (HCC) 2003, 04/2012: CAD S/P percutaneous coronary angioplasty     Comment:  status post PCI to LAD, circumflex-OM 2, RCA No date: Carotid artery occlusion No date: Chronic renal insufficiency, stage II (mild) No date: Chronic venous insufficiency     Comment:  varicosities, no reflux; dopplers 04/14/12- valvular               insufficiency in the R and L GSV No date: Complication of anesthesia No date: COPD, mild (HCC) No date: Depression     Comment:  situaltional  No date: Diabetes mellitus 08/24/2019: Diabetic neuropathy (HCC) No date: Dyslipidemia associated with type 2 diabetes mellitus (HCC) 01/02/2023: Fall at home, initial encounter No date: GERD (gastroesophageal reflux disease) 12/2021: History of cardioversion     Comment:  at [facility] No date: HTN (hypertension) No date: Hypothyroidism No date: Neuromuscular disorder (HCC)     Comment:  neuropathy in feet No date: Neuropathy     Comment:  notably improved following PCI with improved cardiac               function No date: Obesity No date: Obesity, Class II, BMI 35.0-39.9, with comorbidity (see  actual BMI)     Comment:  BMI 39; wgt loss efforts in place; seeing Dietician 01/02/2023: Open ankle fracture No date: Orthostatic hypotension No date: PONV (postoperative nausea and vomiting)     Comment:  [place] 04/2012: Pulmonary hypertension (HCC)     Comment:  PA pressure No  date: Sleep apnea     Comment:  uses nightly No date: Vitamin D  deficiency     Comment:  per facility notes  Reproductive/Obstetrics                              Anesthesia Physical Anesthesia Plan  ASA: 3  Anesthesia Plan: General   Post-op Pain Management: Tylenol  PO (pre-op)*    Induction: Intravenous  PONV Risk Score and Plan: 3 and Treatment may vary due to age or medical condition, Ondansetron , Dexamethasone , Propofol  infusion and TIVA  Airway Management Planned: LMA  Additional Equipment: None  Intra-op Plan:   Post-operative Plan: Extubation in OR  Informed Consent: I have reviewed the patients History and Physical, chart, labs and discussed the procedure including the risks, benefits and alternatives for the proposed anesthesia with the patient or authorized representative who has indicated his/her understanding and acceptance.     Dental advisory given  Plan Discussed with: CRNA and Anesthesiologist  Anesthesia Plan Comments: (Discussed risks of anesthesia with patient, including PONV, sore throat, lip/dental/eye damage. Rare risks discussed as well, such as cardiorespiratory and neurological sequelae, and allergic reactions. Discussed the role of CRNA in patient's perioperative care. Patient understands.)         Anesthesia Quick Evaluation

## 2023-09-20 ENCOUNTER — Encounter (HOSPITAL_COMMUNITY): Payer: Self-pay | Admitting: Student

## 2023-09-20 ENCOUNTER — Other Ambulatory Visit: Payer: Self-pay

## 2023-09-20 LAB — GLUCOSE, CAPILLARY
Glucose-Capillary: 134 mg/dL — ABNORMAL HIGH (ref 70–99)
Glucose-Capillary: 178 mg/dL — ABNORMAL HIGH (ref 70–99)
Glucose-Capillary: 127 mg/dL — ABNORMAL HIGH (ref 70–99)
Glucose-Capillary: 106 mg/dL — ABNORMAL HIGH (ref 70–99)

## 2023-09-20 LAB — CBC
HCT: 21.7 % — ABNORMAL LOW (ref 39.0–52.0)
Hemoglobin: 7 g/dL — ABNORMAL LOW (ref 13.0–17.0)
MCH: 29.3 pg (ref 26.0–34.0)
MCHC: 32.3 g/dL (ref 30.0–36.0)
MCV: 90.8 fL (ref 80.0–100.0)
Platelets: 229 K/uL (ref 150–400)
RBC: 2.39 MIL/uL — ABNORMAL LOW (ref 4.22–5.81)
RDW: 15.7 % — ABNORMAL HIGH (ref 11.5–15.5)
WBC: 6.5 K/uL (ref 4.0–10.5)
nRBC: 0 % (ref 0.0–0.2)

## 2023-09-20 LAB — HEMOGLOBIN AND HEMATOCRIT, BLOOD
HCT: 22.3 % — ABNORMAL LOW (ref 39.0–52.0)
Hemoglobin: 7.2 g/dL — ABNORMAL LOW (ref 13.0–17.0)

## 2023-09-20 NOTE — Progress Notes (Signed)
   ORTHOPAEDIC PROGRESS NOTE  s/p Procedure(s): IRRIGATION AND DEBRIDEMENT WOUND  SUBJECTIVE: Reports moderate pain about operative site.  OBJECTIVE: PE: General: resting in hospital bed, NAD RLE: heel protector in place, dressing CDI, he reports numbness in his toes, he is able to flex and extend all toes, warm well perfused extremity   Vitals:   09/20/23 0401 09/20/23 0803  BP: (!) 169/74 (!) 160/63  Pulse: 66 65  Resp: 18 17  Temp: 98.9 F (37.2 C) 98.3 F (36.8 C)  SpO2: 98% 100%    Opiates Today (MME): Today's  total administered Morphine  Milligram Equivalents: 7.5 Opiates Yesterday (MME): Yesterday's total administered Morphine  Milligram Equivalents: 175 Opiates Used in the last two days:  Inpatient Morphine  Milligram Equivalents Per Day 7/29 - 8/2   Values displayed are in units of MME/Day    Order Start / End Date 7/29 7/30 7/31 Yesterday Today    oxyCODONE  (Oxy IR/ROXICODONE ) immediate release tablet 5 mg 8/1 - 8/1 -- -- -- 7.5 of Unknown --    oxyCODONE  (ROXICODONE ) 5 MG/5ML solution 5 mg 8/1 - 8/1 -- -- -- 0 of Unknown --         Group total: 7.5 of Unknown     HYDROmorphone  (DILAUDID ) injection 0.5 mg 7/29 - 7/29 10 of 10 -- -- -- --    HYDROmorphone  (DILAUDID ) injection 0.5 mg 7/29 - 7/29 10 of 10 -- -- -- --    oxyCODONE  (Oxy IR/ROXICODONE ) immediate release tablet 5 mg 7/30 - 7/29 7.5 of 7.5 -- -- -- --    oxyCODONE  (Oxy IR/ROXICODONE ) immediate release tablet 5 mg 7/30 - No end date -- 22.5 of 30 22.5 of 30 22.5 of 30 7.5 of 30    HYDROmorphone  (DILAUDID ) injection 1 mg 7/30 - No end date -- 80 of 120 80 of 120 100 of 120 0 of 120    fentaNYL  (SUBLIMAZE ) injection 25-50 mcg 8/1 - 8/1 -- -- -- 30 of 45-90 --    fentaNYL  citrate (PF) (SUBLIMAZE ) injection 8/1 - 8/1 -- -- -- *15 of 15 --    Daily Totals  27.5 of 27.5 102.5 of 150 102.5 of 150 * 175 of Unknown (at least 210-255) 7.5 of 150  *One-Step medication  Calculation Errors     Order Type Date Details    oxyCODONE  (Oxy IR/ROXICODONE ) immediate release tablet 5 mg Ordered Dose -- Insufficient frequency information   oxyCODONE  (ROXICODONE ) 5 MG/5ML solution 5 mg Ordered Dose -- Insufficient frequency information          Cultures currently showing no growth.   ASSESSMENT: Harold [name] is a 68 y.o. male POD#1  PLAN: Weightbearing: WBAT RLE Insicional and dressing care: Reinforce dressings as needed Orthopedic device(s): None VTE prophylaxis: resume  Eliquis  Pain control: PRN pain medication, minimize narcotics as able Follow - up plan: 2 weeks in office with Dr. Kendal Dispo: okay to discharge back to SNF once cleared by medicine and therapies  Contact information:   After hours and holidays please check [email or web address] for group call information for [facility]  Aleck Stalling, PA-C 09/20/23

## 2023-09-20 NOTE — Plan of Care (Signed)

## 2023-09-20 NOTE — TOC Progression Note (Addendum)
 Transition of Care [facility]) - Progression Note    Patient Details  Name: Harold [name] MRN: [id number] Date of Birth: 1956-01-17  Transition of [facility]) CM/SW Contact  Gwenn Frieze Carm[place], [place] Phone Number: 09/20/2023, 11:04 AM  Clinical Narrative: Per SW documentation 8/1, pt has a bed at [facility] pending auth and medical clearance. Call placed [facility] and they do not show auth request was submitted. Reached out to [place] with [facility] to see if they have information re auth status. Will provide updates as available.   UPDATE 1230: Per [place] with [facility], no auth showing on their end. Auth request has now been submitted with [facility], reference #[id number]. Will provide updates as available.   Frieze Gwenn, MSW, LCSW [phone] (coverage)         Expected Discharge Plan: Skilled Nursing Facility Barriers to Discharge: Continued Medical Work up               Expected Discharge Plan and Services In-house Referral: Clinical Social Work   Post Acute Care Choice: Skilled Nursing Facility Living arrangements for the past 2 months: Single Family Home, Skilled Nursing Facility                   DME Agency:  ([facility])         HH Agency: [facility] ([facility])         Social Drivers of Health (SDOH) Interventions SDOH Screenings   Food Insecurity: No Food Insecurity (09/16/2023)  Housing: Low Risk  (09/16/2023)  Recent Concern: Housing - High Risk (07/31/2023)  Transportation Needs: Unmet Transportation Needs (09/16/2023)  Utilities: Not At Risk (09/16/2023)  Depression (PHQ2-9): Low Risk  (07/29/2023)  Social Connections: Moderately Isolated (09/16/2023)  Tobacco Use: Medium Risk (09/19/2023)    Readmission Risk Interventions     No data to display

## 2023-09-20 NOTE — Progress Notes (Signed)
 HD#4 SUBJECTIVE:  Patient Summary: Harold [name] is a 68 y.o. 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  Overnight Events: none  Interim History: Doing well this morning. He has an appetite, slept well last night. Pain today 9/10. Denies SOB, chest pain, dizziness, changes in urination, or abdominal pain. He still is itchy on his back, the hydrocortisone  helped yesterday but itchiness came back. Nurse present and said would apply more.  Patients PICC line successfully placed 8/1. Discussed plan for d/c today, pending insurance auth for [facility]. Also the importance of restarting anticoag and antiplatelet meds due to his risk of clotting due to surgical history, being sedentary, history of afib and vascular disease. Patient voices understanding. Also discussed amputation education at his ortho follow up outpatient and options available such as [facility].   OBJECTIVE:  Vital Signs: Vitals:   09/19/23 1326 09/19/23 1607 09/19/23 1939 09/20/23 0401  BP: 138/63 (!) 119/53 138/66 (!) 169/74  Pulse: (!) 56 (!) 59 64 66  Resp:  17 16 18   Temp: 97.7 F (36.5 C) 98.2 F (36.8 C) 97.6 F (36.4 C) 98.9 F (37.2 C)  TempSrc: Oral Oral Oral Oral  SpO2: 100% 98% 100% 98%  Weight:      Height:       Supplemental O2: Room Air SpO2: 98 % O2 Flow Rate (L/min): 5 L/min  Filed Weights   09/16/23 1234 09/19/23 0803  Weight: 102.5 kg 102.5 kg     Intake/Output Summary (Last 24 hours) at 09/20/2023 0555 Last data filed at 09/19/2023 1849 Gross per 24 hour  Intake 682.65 ml  Output 352 ml  Net 330.65 ml   Net IO Since Admission: -403.35 mL [09/20/23 0555]  Physical Exam: Physical Exam Vitals reviewed.  Constitutional:      General: He is not in  acute distress.    Appearance: Normal appearance. He is not ill-appearing or diaphoretic.  HENT:     Nose: No congestion or rhinorrhea.  Eyes:     Extraocular Movements: Extraocular movements intact.     Conjunctiva/sclera: Conjunctivae normal.  Cardiovascular:     Rate and Rhythm: Normal rate and regular rhythm.  Pulmonary:     Effort: Pulmonary effort is normal.     Breath sounds: Normal breath sounds. No wheezing.  Abdominal:     General: Abdomen is flat. Bowel sounds are normal.     Tenderness: There is no abdominal tenderness.  Musculoskeletal:     Comments: Right ankle in bandage and protective brace.  Denies sensation of R toes Reduced sensation to L toes  Skin:    General: Skin is warm and dry.     Comments: Negative for rash on back  Neurological:     General: No focal deficit present.     Mental Status: He is alert and oriented to person, place, and time. Mental status is at baseline.  Psychiatric:        Mood and Affect: Mood normal.     Patient Lines/Drains/Airways Status     Active Line/Drains/Airways     Name Placement date Placement time Site Days   PICC Single Lumen 09/19/23 Left Basilic 44 cm 0 cm 09/19/23  8295  Basilic  1   External Urinary Catheter 09/16/23  2100  --  4   Pressure Injury 06/20/23 Sacrum Right Unstageable - Full thickness tissue loss in which the base of the injury is covered by slough (yellow, tan, gray, green or brown) and/or eschar (tan, brown or black) in the wound bed. 06/20/23  0050  -- 92   Wound / Incision (Open or Dehisced) 06/20/23 Incision - Open;Other (Comment) Ankle Lateral;Right 06/20/23  0045  Ankle  92   Wound / Incision (Open or Dehisced) 07/18/23 Arm Left;Lower;Posterior 07/18/23  0524  Arm  64   Wound / Incision (Open or Dehisced) 07/30/23 Non-pressure wound Heel Right 07/30/23  1540  Heel  52   Wound / Incision (Open or Dehisced) 07/30/23 Foot Right;Posterior 07/30/23  1540  Foot  52            Pertinent labs and  imaging:  PM 8/1 7.8 Hgb, 24.3 Hct WBC 6.5, 7 Hgb, 21.7 Hct    Latest Ref Rng & Units 09/20/2023    4:13 AM 09/19/2023    1:51 PM 09/19/2023    4:39 AM  CBC  WBC 4.0 - 10.5 K/uL 6.5   7.4   Hemoglobin 13.0 - 17.0 g/dL 7.0  7.8  7.6   Hematocrit 39.0 - 52.0 % 21.7  24.3  23.5   Platelets 150 - 400 K/uL 229   270        Latest Ref Rng & Units 09/19/2023    4:39 AM 09/18/2023   11:21 AM 09/17/2023    5:12 AM  CMP  Glucose 70 - 99 mg/dL 850  823  782   BUN 8 - 23 mg/dL 13  14  18    Creatinine 0.61 - 1.24 mg/dL 8.26  8.10  8.41   Sodium 135 - 145 mmol/L 136  136  134   Potassium 3.5 - 5.1 mmol/L 3.6  4.2  3.7   Chloride 98 - 111 mmol/L 106  105  104   CO2 22 - 32 mmol/L 21  20  21    Calcium  8.9 - 10.3 mg/dL 8.3  8.5  8.2     US  EKG SITE RITE Result Date: 09/19/2023 If Site Rite image not attached, placement could not be confirmed due to current cardiac rhythm.   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 [facility]) Active Problems:   Diabetes mellitus type 2 with neurological manifestations (HCC)   Essential hypertension   Neuropathy   Hyperlipidemia associated with type 2 diabetes mellitus (HCC)   CAD, LAD/CFX DES 04/28/12- staged RCA DES 04/29/12 after pt declined CABG, cath 05/14/13 stable CAD medical therapy   COPD (chronic obstructive pulmonary disease) (HCC)   Chronic heart failure with preserved ejection fraction (HFpEF) (HCC)   Gastroesophageal reflux disease without esophagitis   PVD (peripheral vascular disease) (HCC)   CKD stage 3b, GFR 30-44 ml/min (HCC)   Normocytic anemia   PAD (peripheral artery disease) (HCC)   Type 2 diabetes mellitus with diabetic nephropathy, with long-term current use of insulin  (HCC)   Stage 3a chronic kidney disease (HCC)   Type 2 diabetes mellitus with diabetic polyneuropathy, with long-term current use of insulin  (HCC)   Plan: #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  -Hardware removed by Dr. Kendal 06/21/2023. Hospital discharge on 07/22/23 with chronic septic joint completed ertapenem  and daptomycin  on 07/31/2023.  -  Right digits are swollen, weeping skins lesions needing repeated dressing changes.  -MRI 7/29: Suspicious for OM in distal tibia, talus, and calcaneus. Suspected septic tibiotalar, distal tibiofibular, and subtalar joints. -pending 7/29 blood cultures, no growth  at 4 days   -IV Linezolid  and change to cefepime  (7/30) until 7/31. Switched to IV dapto and ertapenem  on 7/31. Monitor CK and eosinophils.  -7/31 CK 19, ESR 103, CRP 7.6, eos .9  -WBC wnl and patient afebrile 8/2 -Successful Irrigation and debridement on 8/1. Patient weightbearing as tolerated. Two specimens taken gram stain: no WBC or orgs seen so far, pending. Culture: GNR, pending -Resume anticoagulation/platelet today -gabapentin , tylenol , oxycodone , and hydromorphone  orders in for pain control    #Peripheral Vascular Disease -Follows with Dr. Darron who in Aug 2021 did successful balloon angioplasty to tibioperoneal trunk in posterior tibial artery with drug-coated angioplasty. In sept 2024, patient had reccurent wounds on left leg which were healed after Dr. Darron did successful orbital atherectomy and drug-coated balloon angioplasty to left popliteal artery, TP trunk and posterior tibial artery.  -After fall in Nov 2024, Dr. Serene did Angiogram which showed calcified stenosis in distal right SFA, treated with Eluvia drug-eluting stent.  -OT and PT seeing  -Resume anticoagulation/platelet 8/2   #Qtc prolongation #Paroxysmal atrial fibrillation  -Afib diagnosed in 2023, refractory to [facility] on 03/13/22 and 03/27/22, Dr. Nancey transitioned to medical therapy starting  amio -Hospitalization in June 2025, patient had Qtc prolongation (likely due to polypharmacy with prolong drugs) treated with amiodarone  with recommendations to avoid further Qtc prolong medications -Continue home amiodarone  200 mg twice daily -Resume anticoagulation/platelet 8/2   #Diabetes Mellitus Type 2 -A1c 5.2% on 5/30 -glucose 235 on admission, 149 8/1  -goal sugars <180 for wound healing optimization -sliding scale, card modified diet -glargine 8 units daily (increased from 5 units on 7/30), aspart 0-15   #Chronic Kidney Disease Stage 3b -patient denies symptoms concerning for infection, has been urinating normally  -has had dialysis in the past but not currently  -8/1 Cr 1.73, BUN 13 -monitor electrolytes and urine output   #Coronary artery disease s/p CABG -home meds: apixaban  5 mg twice daily -Resume anticoagulation/platelet 8/2 -Resume home Lipitor 40 mg    #Chronic anemia -8/2 1330 7.2 -monitor CBC closely    #Chronic HTN -Carvedilol  discontinued in July due to concern for orthostatic hypotension -blood pressures have been high, this morning 169/74 -Continue home losartan  25 mg    #OSA on CPAP -continue CPAP   #GAD #Depression -duloxetine  20 mg -trazodone  50 mg   #BPH -finasteride  5 mg   #GERD -continue pantoprazole  40 mg tablet   #COPD   #Hypothyroidism -continue home levothyroxine  88 mcg     Best Practice: Diet: Diabetic diet VTE: SCDs Start: 09/19/23 1128, apixaban  and clopidogrel  (home meds resumed 8/2) Code: Full  Disposition planning: Therapy Recs: SNF, DME: none Family Contact: Zachary,  DISPO: Anticipated discharge today to Skilled nursing facility pending Insurance for SNF coverage.  Signature:  Viktoria Charmayne [facility]  5:55 AM, 09/20/2023  On Call pager [phone]

## 2023-09-21 LAB — CBC
HCT: 23.3 % — ABNORMAL LOW (ref 39.0–52.0)
Hemoglobin: 7.5 g/dL — ABNORMAL LOW (ref 13.0–17.0)
MCH: 29.4 pg (ref 26.0–34.0)
MCHC: 32.2 g/dL (ref 30.0–36.0)
MCV: 91.4 fL (ref 80.0–100.0)
Platelets: 264 K/uL (ref 150–400)
RBC: 2.55 MIL/uL — ABNORMAL LOW (ref 4.22–5.81)
RDW: 15.6 % — ABNORMAL HIGH (ref 11.5–15.5)
WBC: 8.3 K/uL (ref 4.0–10.5)
nRBC: 0 % (ref 0.0–0.2)

## 2023-09-21 LAB — CULTURE, BLOOD (ROUTINE X 2)
Culture: NO GROWTH
Culture: NO GROWTH
Special Requests: ADEQUATE
Special Requests: ADEQUATE

## 2023-09-21 LAB — GLUCOSE, CAPILLARY
Glucose-Capillary: 116 mg/dL — ABNORMAL HIGH (ref 70–99)
Glucose-Capillary: 141 mg/dL — ABNORMAL HIGH (ref 70–99)
Glucose-Capillary: 128 mg/dL — ABNORMAL HIGH (ref 70–99)
Glucose-Capillary: 132 mg/dL — ABNORMAL HIGH (ref 70–99)

## 2023-09-21 NOTE — Plan of Care (Signed)

## 2023-09-21 NOTE — Progress Notes (Signed)
 HD#5 SUBJECTIVE:  Patient Summary: Harold [name] is a 68 y.o. 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  Overnight Events: none  Interim History: Patient was sleeping well with his CPAP machine on before our arrival. He only has pain in his right leg which is about a 9/10, improving but still in pain. Describes it as a burning pain. He feels his right toes are numb.  Denies SOB, fevers, and abdominal pain.  The itchiness on his back has improved.  Discussed that we are waiting on insurance approval, patient is ready to be d/c with PICC line in place and continuation of abx.   OBJECTIVE:  Vital Signs: Vitals:   09/20/23 0401 09/20/23 0803 09/20/23 1643 09/20/23 2147  BP: (!) 169/74 (!) 160/63 (!) 148/60 (!) 142/59  Pulse: 66 65 60 62  Resp: 18 17  16   Temp: 98.9 F (37.2 C) 98.3 F (36.8 C) (!) 97.5 F (36.4 C) 98.6 F (37 C)  TempSrc: Oral Oral Oral Oral  SpO2: 98% 100% 96%   Weight:      Height:       Supplemental O2: Room Air SpO2: 96 % O2 Flow Rate (L/min): 5 L/min  Filed Weights   09/16/23 1234 09/19/23 0803  Weight: 102.5 kg 102.5 kg     Intake/Output Summary (Last 24 hours) at 09/21/2023 9372 Last data filed at 09/20/2023 1300 Gross per 24 hour  Intake 480 ml  Output 500 ml  Net -20 ml   Net IO Since Admission: -423.35 mL [09/21/23 0627]  Physical Exam: Physical Exam Constitutional:      General: He is not in acute distress.    Appearance: Normal appearance. He is not ill-appearing, toxic-appearing or diaphoretic.  HENT:     Nose: No congestion.     Mouth/Throat:     Pharynx: No oropharyngeal exudate.  Eyes:     Extraocular Movements: Extraocular movements intact.     Conjunctiva/sclera: Conjunctivae normal.   Cardiovascular:     Heart sounds: Normal heart sounds.  Pulmonary:     Effort: Pulmonary effort is normal.  Abdominal:     Tenderness: There is no abdominal tenderness.  Musculoskeletal:     Comments: Right ankle in protective brace Right toes are warm to touch  Skin:    General: Skin is warm and dry.  Neurological:     Mental Status: He is alert and oriented to person, place, and time. Mental status is at baseline.     Comments: Decreased sensation in right toes  Psychiatric:        Mood and Affect: Mood normal.     Patient Lines/Drains/Airways Status     Active Line/Drains/Airways     Name Placement date Placement time Site Days   PICC Single Lumen 09/19/23 Left Basilic 44 cm 0 cm 09/19/23  8295  Basilic  2   External Urinary Catheter 09/16/23  2100  --  5   Pressure Injury 06/20/23 Sacrum Right Unstageable - Full thickness tissue loss in which the base of the injury is covered by slough (yellow, tan, gray, green or brown) and/or eschar (tan, brown or black) in the wound bed. 06/20/23  0050  -- 93   Wound / Incision (Open or Dehisced) 06/20/23 Incision - Open;Other (Comment) Ankle Lateral;Right  06/20/23  0045  Ankle  93   Wound / Incision (Open or Dehisced) 07/18/23 Arm Left;Lower;Posterior 07/18/23  0524  Arm  65   Wound / Incision (Open or Dehisced) 07/30/23 Non-pressure wound Heel Right 07/30/23  1540  Heel  53   Wound / Incision (Open or Dehisced) 07/30/23 Foot Right;Posterior 07/30/23  1540  Foot  53            Pertinent labs and imaging:  Blood cx NG at 4d Surg cx: rare GNR    Latest Ref Rng & Units 09/21/2023    4:35 AM 09/20/2023    1:30 PM 09/20/2023    4:13 AM  CBC  WBC 4.0 - 10.5 K/uL 8.3   6.5   Hemoglobin 13.0 - 17.0 g/dL 7.5  7.2  7.0   Hematocrit 39.0 - 52.0 % 23.3  22.3  21.7   Platelets 150 - 400 K/uL 264   229        Latest Ref Rng & Units 09/19/2023    4:39 AM 09/18/2023   11:21 AM 09/17/2023    5:12 AM  CMP  Glucose 70 - 99 mg/dL 850  823  782    BUN 8 - 23 mg/dL 13  14  18    Creatinine 0.61 - 1.24 mg/dL 8.26  8.10  8.41   Sodium 135 - 145 mmol/L 136  136  134   Potassium 3.5 - 5.1 mmol/L 3.6  4.2  3.7   Chloride 98 - 111 mmol/L 106  105  104   CO2 22 - 32 mmol/L 21  20  21    Calcium  8.9 - 10.3 mg/dL 8.3  8.5  8.2     No results found.  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 [facility]) Active Problems:   Diabetes mellitus type 2 with neurological manifestations (HCC)   Essential hypertension   Neuropathy   Hyperlipidemia associated with type 2 diabetes mellitus (HCC)   CAD, LAD/CFX DES 04/28/12- staged RCA DES 04/29/12 after pt declined CABG, cath 05/14/13 stable CAD medical therapy   COPD (chronic obstructive pulmonary disease) (HCC)   Chronic heart failure with preserved ejection fraction (HFpEF) (HCC)   Gastroesophageal reflux disease without esophagitis   PVD (peripheral vascular disease) (HCC)   CKD stage 3b, GFR 30-44 ml/min (HCC)   Normocytic anemia   PAD (peripheral artery disease) (HCC)   Type 2 diabetes mellitus with diabetic nephropathy, with long-term current use of insulin  (HCC)   Stage 3a chronic kidney disease (HCC)   Type 2 diabetes mellitus with diabetic polyneuropathy, with long-term current use of insulin  (HCC)   Plan: Harold [name] is a 68 y.o. 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  -Hardware removed by Dr. Kendal 06/21/2023. Hospital  discharge on 07/22/23 with chronic septic joint completed ertapenem  and daptomycin  on 07/31/2023.  -Right digits are swollen, weeping skins lesions needing repeated dressing changes.  -MRI 7/29: Suspicious for OM in distal tibia, talus, and calcaneus. Suspected septic tibiotalar, distal tibiofibular, and subtalar joints. -pending  7/29 blood cultures, no growth  at 4 days   -IV Linezolid  and change to cefepime  (7/30) until 7/31. Switched to IV dapto and ertapenem  on 7/31.  -7/31 CK 19, ESR 103, CRP 7.6, eos .9  -WBC wnl and patient afebrile 8/3 -Successful Irrigation and debridement on 8/1. Patient weightbearing as tolerated. Culture identified enterobacter cloacae -Resumed anticoagulation/platelet  -gabapentin , tylenol , oxycodone , and hydromorphone  orders in for pain control    #Peripheral Vascular Disease -Follows with Dr. Darron who in Aug 2021 did successful balloon angioplasty to tibioperoneal trunk in posterior tibial artery with drug-coated angioplasty. In sept 2024, patient had reccurent wounds on left leg which were healed after Dr. Darron did successful orbital atherectomy and drug-coated balloon angioplasty to left popliteal artery, TP trunk and posterior tibial artery.  -After fall in Nov 2024, Dr. Serene did Angiogram which showed calcified stenosis in distal right SFA, treated with Eluvia drug-eluting stent.  -OT and PT saw patient  -Resumed anticoagulation/platelet 8/2   #Qtc prolongation #Paroxysmal atrial fibrillation  -Afib diagnosed in 2023, refractory to [facility] on 03/13/22 and 03/27/22, Dr. Nancey transitioned to medical therapy starting amio -Hospitalization in June 2025, patient had Qtc prolongation (likely due to polypharmacy with prolong drugs) treated with amiodarone  with recommendations to avoid further Qtc prolong medications -Continue home amiodarone  200 mg twice daily -Resumed anticoagulation/platelet 8/2   #Diabetes Mellitus Type 2 -A1c 5.2% on 5/30 -glucose 235 on  admission, 149 8/1  -goal sugars <180 for wound healing optimization -sliding scale, card modified diet -glargine 8 units daily (increased from 5 units on 7/30), aspart 0-15   #Chronic Kidney Disease Stage 3b -patient denies symptoms concerning for infection, has been urinating normally  -has had dialysis in the past but not currently  -8/1 Cr 1.73, BUN 13 -monitor electrolytes and urine output   #Coronary artery disease s/p CABG -home meds: apixaban  5 mg twice daily -Resumed anticoagulation/platelet 8/2 -Resume home Lipitor 40 mg    #Chronic anemia -8/3 7.5 -monitor CBC closely    #Chronic HTN -Carvedilol  discontinued in July due to concern for orthostatic hypotension -blood pressures have been high, this morning 169/74 -Continue home losartan  25 mg    #OSA on CPAP -continue CPAP   #GAD #Depression -duloxetine  20 mg -trazodone  50 mg   #BPH -finasteride  5 mg   #GERD -continue pantoprazole  40 mg tablet   #COPD   #Hypothyroidism -continue home levothyroxine  88 mcg    Best Practice: Diet: Diabetic diet VTE: SCDs Start: 09/19/23 1128, apixaban  and clopidogrel   Code: Full  Disposition planning: Therapy Recs: SNF Family Contact: Spouse, Zachary DISPO: Anticipated discharge today or tomorrow to Skilled nursing facility pending Insurance for SNF coverage.  Signature:  Viktoria Charmayne [facility]ol[facility]  6:27 AM, 09/21/2023  On Call pager [phone]

## 2023-09-22 DIAGNOSIS — M86171 Other acute osteomyelitis, right ankle and foot: Secondary | ICD-10-CM | POA: Diagnosis not present

## 2023-09-22 DIAGNOSIS — S82851E Displaced trimalleolar fracture of right lower leg, subsequent encounter for open fracture type I or II with routine healing: Secondary | ICD-10-CM | POA: Diagnosis not present

## 2023-09-22 DIAGNOSIS — T84624D Infection and inflammatory reaction due to internal fixation device of right fibula, subsequent encounter: Secondary | ICD-10-CM | POA: Diagnosis not present

## 2023-09-22 DIAGNOSIS — E871 Hypo-osmolality and hyponatremia: Secondary | ICD-10-CM | POA: Diagnosis not present

## 2023-09-22 DIAGNOSIS — T84418D Breakdown (mechanical) of other internal orthopedic devices, implants and grafts, subsequent encounter: Secondary | ICD-10-CM | POA: Diagnosis not present

## 2023-09-22 DIAGNOSIS — I10 Essential (primary) hypertension: Secondary | ICD-10-CM | POA: Diagnosis not present

## 2023-09-22 DIAGNOSIS — M6281 Muscle weakness (generalized): Secondary | ICD-10-CM | POA: Diagnosis not present

## 2023-09-22 DIAGNOSIS — E43 Unspecified severe protein-calorie malnutrition: Secondary | ICD-10-CM | POA: Diagnosis not present

## 2023-09-22 DIAGNOSIS — K219 Gastro-esophageal reflux disease without esophagitis: Secondary | ICD-10-CM | POA: Diagnosis not present

## 2023-09-22 DIAGNOSIS — M00871 Arthritis due to other bacteria, right ankle and foot: Secondary | ICD-10-CM | POA: Diagnosis not present

## 2023-09-22 DIAGNOSIS — E1140 Type 2 diabetes mellitus with diabetic neuropathy, unspecified: Secondary | ICD-10-CM | POA: Diagnosis not present

## 2023-09-22 DIAGNOSIS — N1832 Chronic kidney disease, stage 3b: Secondary | ICD-10-CM | POA: Diagnosis not present

## 2023-09-22 DIAGNOSIS — Z8709 Personal history of other diseases of the respiratory system: Secondary | ICD-10-CM | POA: Diagnosis not present

## 2023-09-22 DIAGNOSIS — I4581 Long QT syndrome: Secondary | ICD-10-CM | POA: Diagnosis not present

## 2023-09-22 DIAGNOSIS — Z8679 Personal history of other diseases of the circulatory system: Secondary | ICD-10-CM | POA: Diagnosis not present

## 2023-09-22 DIAGNOSIS — E876 Hypokalemia: Secondary | ICD-10-CM | POA: Diagnosis not present

## 2023-09-22 DIAGNOSIS — E559 Vitamin D deficiency, unspecified: Secondary | ICD-10-CM | POA: Diagnosis not present

## 2023-09-22 DIAGNOSIS — I959 Hypotension, unspecified: Secondary | ICD-10-CM | POA: Diagnosis not present

## 2023-09-22 DIAGNOSIS — R42 Dizziness and giddiness: Secondary | ICD-10-CM | POA: Diagnosis not present

## 2023-09-22 DIAGNOSIS — L97411 Non-pressure chronic ulcer of right heel and midfoot limited to breakdown of skin: Secondary | ICD-10-CM | POA: Diagnosis not present

## 2023-09-22 DIAGNOSIS — I480 Paroxysmal atrial fibrillation: Secondary | ICD-10-CM | POA: Diagnosis not present

## 2023-09-22 DIAGNOSIS — I70234 Atherosclerosis of native arteries of right leg with ulceration of heel and midfoot: Secondary | ICD-10-CM | POA: Diagnosis not present

## 2023-09-22 DIAGNOSIS — E1165 Type 2 diabetes mellitus with hyperglycemia: Secondary | ICD-10-CM | POA: Diagnosis not present

## 2023-09-22 DIAGNOSIS — R262 Difficulty in walking, not elsewhere classified: Secondary | ICD-10-CM | POA: Diagnosis not present

## 2023-09-22 DIAGNOSIS — E039 Hypothyroidism, unspecified: Secondary | ICD-10-CM | POA: Diagnosis not present

## 2023-09-22 DIAGNOSIS — I2510 Atherosclerotic heart disease of native coronary artery without angina pectoris: Secondary | ICD-10-CM | POA: Diagnosis not present

## 2023-09-22 LAB — CBC
HCT: 23.4 % — ABNORMAL LOW (ref 39.0–52.0)
Hemoglobin: 7.3 g/dL — ABNORMAL LOW (ref 13.0–17.0)
MCH: 28.4 pg (ref 26.0–34.0)
MCHC: 31.2 g/dL (ref 30.0–36.0)
MCV: 91.1 fL (ref 80.0–100.0)
Platelets: 235 K/uL (ref 150–400)
RBC: 2.57 MIL/uL — ABNORMAL LOW (ref 4.22–5.81)
RDW: 15.5 % (ref 11.5–15.5)
WBC: 7.6 K/uL (ref 4.0–10.5)
nRBC: 0 % (ref 0.0–0.2)

## 2023-09-22 LAB — AEROBIC CULTURE W GRAM STAIN (SUPERFICIAL SPECIMEN): Gram Stain: NONE SEEN

## 2023-09-22 LAB — GLUCOSE, CAPILLARY
Glucose-Capillary: 126 mg/dL — ABNORMAL HIGH (ref 70–99)
Glucose-Capillary: 137 mg/dL — ABNORMAL HIGH (ref 70–99)

## 2023-09-22 MED ORDER — HYDROCODONE-ACETAMINOPHEN 5-325 MG PO TABS: 1.0000 | ORAL_TABLET | Freq: Four times a day (QID) | ORAL | 0 refills | Status: DC | PRN

## 2023-09-22 MED ORDER — DULOXETINE HCL 60 MG PO CPEP
60.0000 mg | ORAL_CAPSULE | Freq: Every day | ORAL | Status: DC
  Administered 2023-09-22: 60 mg via ORAL
  Filled 2023-09-22: qty 1

## 2023-09-22 MED ORDER — TAMSULOSIN HCL 0.4 MG PO CAPS: 0.4000 mg | ORAL_CAPSULE | Freq: Every day | ORAL | Status: DC

## 2023-09-22 MED ORDER — ONDANSETRON HCL 4 MG PO TABS: 4.0000 mg | ORAL_TABLET | Freq: Three times a day (TID) | ORAL | Status: DC | PRN

## 2023-09-22 MED ORDER — LANTUS SOLOSTAR 100 UNIT/ML ~~LOC~~ SOPN: 8.0000 [IU] | PEN_INJECTOR | Freq: Every day | SUBCUTANEOUS | Status: DC

## 2023-09-22 NOTE — Plan of Care (Signed)
 Problem: Education: Goal: Ability to describe self-care measures that may prevent or decrease complications (Diabetes Survival Skills Education) will improve 09/22/2023 0428 by Harold Leonette CROME, RN Outcome: Progressing 09/22/2023 0428 by Harold Leonette CROME, RN Outcome: Progressing Goal: Individualized Educational Video(s) 09/22/2023 0428 by Harold Leonette CROME, RN Outcome: Progressing 09/22/2023 0428 by Harold Leonette CROME, RN Outcome: Progressing   Problem: Coping: Goal: Ability to adjust to condition or change in health will improve 09/22/2023 0428 by Harold Leonette CROME, RN Outcome: Progressing 09/22/2023 0428 by Harold Leonette CROME, RN Outcome: Progressing   Problem: Fluid Volume: Goal: Ability to maintain a balanced intake and output will improve 09/22/2023 0428 by Harold Leonette CROME, RN Outcome: Progressing 09/22/2023 0428 by Harold Leonette CROME, RN Outcome: Progressing   Problem: Health Behavior/Discharge Planning: Goal: Ability to identify and utilize available resources and services will improve 09/22/2023 0428 by Harold Leonette CROME, RN Outcome: Progressing 09/22/2023 0428 by Harold Leonette CROME, RN Outcome: Progressing Goal: Ability to manage health-related needs will improve 09/22/2023 0428 by Harold Leonette CROME, RN Outcome: Progressing 09/22/2023 0428 by Harold Leonette CROME, RN Outcome: Progressing   Problem: Metabolic: Goal: Ability to maintain appropriate glucose levels will improve 09/22/2023 0428 by Harold Leonette CROME, RN Outcome: Progressing 09/22/2023 0428 by Harold Leonette CROME, RN Outcome: Progressing   Problem: Nutritional: Goal: Maintenance of adequate nutrition will improve 09/22/2023 0428 by Harold Leonette CROME, RN Outcome: Progressing 09/22/2023 0428 by Harold Leonette CROME, RN Outcome: Progressing Goal: Progress toward achieving an optimal weight will improve 09/22/2023 0428 by Harold Leonette CROME, RN Outcome: Progressing 09/22/2023 0428 by Harold Leonette CROME, RN Outcome: Progressing   Problem:  Skin Integrity: Goal: Risk for impaired skin integrity will decrease 09/22/2023 0428 by Harold Leonette CROME, RN Outcome: Progressing 09/22/2023 0428 by Harold Leonette CROME, RN Outcome: Progressing   Problem: Tissue Perfusion: Goal: Adequacy of tissue perfusion will improve 09/22/2023 0428 by Harold Leonette CROME, RN Outcome: Progressing 09/22/2023 0428 by Harold Leonette CROME, RN Outcome: Progressing   Problem: Education: Goal: Knowledge of General Education information will improve Description: Including pain rating scale, medication(s)/side effects and non-pharmacologic comfort measures 09/22/2023 0428 by Harold Leonette CROME, RN Outcome: Progressing 09/22/2023 0428 by Harold Leonette CROME, RN Outcome: Progressing   Problem: Health Behavior/Discharge Planning: Goal: Ability to manage health-related needs will improve 09/22/2023 0428 by Harold Leonette CROME, RN Outcome: Progressing 09/22/2023 0428 by Harold Leonette CROME, RN Outcome: Progressing   Problem: Clinical Measurements: Goal: Ability to maintain clinical measurements within normal limits will improve 09/22/2023 0428 by Harold Leonette CROME, RN Outcome: Progressing 09/22/2023 0428 by Harold Leonette CROME, RN Outcome: Progressing Goal: Will remain free from infection 09/22/2023 0428 by Harold Leonette CROME, RN Outcome: Progressing 09/22/2023 0428 by Harold Leonette CROME, RN Outcome: Progressing Goal: Diagnostic test results will improve 09/22/2023 0428 by Harold Leonette CROME, RN Outcome: Progressing 09/22/2023 0428 by Harold Leonette CROME, RN Outcome: Progressing Goal: Respiratory complications will improve 09/22/2023 0428 by Harold Leonette CROME, RN Outcome: Progressing 09/22/2023 0428 by Harold Leonette CROME, RN Outcome: Progressing Goal: Cardiovascular complication will be avoided 09/22/2023 0428 by Harold Leonette CROME, RN Outcome: Progressing 09/22/2023 0428 by Harold Leonette CROME, RN Outcome: Progressing   Problem: Activity: Goal: Risk for activity intolerance will  decrease 09/22/2023 0428 by Harold Leonette CROME, RN Outcome: Progressing 09/22/2023 0428 by Harold Leonette CROME, RN Outcome: Progressing   Problem: Nutrition: Goal: Adequate nutrition will be maintained 09/22/2023 0428 by Harold Leonette CROME, RN Outcome: Progressing 09/22/2023 0428 by Harold Leonette CROME, RN Outcome: Progressing  Problem: Coping: Goal: Level of anxiety will decrease 09/22/2023 0428 by Harold Leonette CROME, RN Outcome: Progressing 09/22/2023 0428 by Harold Leonette CROME, RN Outcome: Progressing   Problem: Elimination: Goal: Will not experience complications related to bowel motility Outcome: Progressing Goal: Will not experience complications related to urinary retention Outcome: Progressing   Problem: Pain Managment: Goal: General experience of comfort will improve and/or be controlled Outcome: Progressing   Problem: Safety: Goal: Ability to remain free from injury will improve Outcome: Progressing   Problem: Skin Integrity: Goal: Risk for impaired skin integrity will decrease Outcome: Progressing

## 2023-09-22 NOTE — TOC Transition Note (Signed)
 Transition of Care [facility]) - Discharge Note   Patient Details  Name: Harold [name] MRN: [id number] Date of Birth: Jun 18, 1955  [facility] CM/SW Contact:  Almarie CHRISTELLA Goodie, LCSW Phone Number: 09/22/2023, 2:26 PM   Clinical Narrative:   Patient received insurance authorization, and [facility] can admit patient today. Patient stable for discharge. CSW met with patient, he is in agreement, and requested that CSW notify his spouse. Voicemail left for spouse, Zachary. Transport arranged with [facility] for next available.  Nurse to call report to [phone], Room 309.    Final next level of care: Skilled Nursing Facility Barriers to Discharge: Barriers Resolved   Patient Goals and CMS Choice Patient states their goals for this hospitalization and ongoing recovery are:: to return to SNF STR          Discharge Placement              Patient chooses bed at: [facility] Patient to be transferred to facility by: [facility] Name of family member notified: Zachary Patient and family notified of of transfer: 09/22/23  Discharge Plan and Services Additional resources added to the After Visit Summary for   In-house Referral: Clinical Social Work   Post Acute Care Choice: Skilled Nursing Facility            DME Agency:  ([facility])         HH Agency: [facility] ([facility]        Social Drivers of Health (SDOH) Interventions SDOH Screenings   Food Insecurity: No Food Insecurity (09/16/2023)  Housing: Low Risk  (09/16/2023)  Recent Concern: Housing - High Risk (07/31/2023)  Transportation Needs: Unmet Transportation Needs (09/16/2023)  Utilities: Not At Risk (09/16/2023)  Depression (PHQ2-9): Low Risk  (07/29/2023)  Social Connections: Moderately Isolated (09/16/2023)  Tobacco Use: Medium Risk (09/19/2023)     Readmission Risk Interventions     No data to display

## 2023-09-22 NOTE — Care Management Important Message (Signed)
 Important Message  Patient Details  Name: Harold [name] MRN: [id number] Date of Birth: 09/16/1955   Important Message Given:  Yes - Medicare IM     Claretta Deed 09/22/2023, 4:29 PM

## 2023-09-22 NOTE — Progress Notes (Signed)
 Occupational Therapy Treatment Patient Details Name: Harold [name] MRN: [id number] DOB: 07/01/1955 Today's Date: 09/22/2023   History of present illness 68 y.o. 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 Other PMH includes HTN, CAD, COPD, DM2, CKD, PVD, gout, afib, neuropathy, obesity.   OT comments  Patient received in supine and agreeable to OT/PT treatment. Patient's BP in supine 137/63 (86). Patient instructed on bed mobility and once on EOB stated he felt dizzy with BP at 94/55 (66). Patient asking to return to supine but remained with encouragement and BP increasing to 114/57 following 2 minutes. Patient remained on EOB performing self care tasks before returning to supine with BP at 152/70 in supine. Level 2 therapy band provided to patient and instructed in BUE exercises to increase UE use with mobility. Patient will benefit from continued inpatient follow up therapy, <3 hours/day. Acute OT to continue to follow to address established goals to facilitate DC to next venue of care.        If plan is discharge home, recommend the following:  A lot of help with walking and/or transfers;A lot of help with bathing/dressing/bathroom;Assistance with cooking/housework;Assist for transportation;Help with stairs or ramp for entrance   Equipment Recommendations  Other (comment) (defer)    Recommendations for Other Services      Precautions / Restrictions Precautions Precautions: Fall;Other (comment) Recall of Precautions/Restrictions: Intact Precaution/Restrictions Comments: symptomatic orthostatic hypotension (but BP recovered while sitting EOB) Restrictions Weight  Bearing Restrictions Per Provider Order: No Other Position/Activity Restrictions: pt preference for R ankle NWB       Mobility Bed Mobility Overal bed mobility: Needs Assistance Bed Mobility: Rolling, Sidelying to Sit, Sit to Sidelying Rolling: Supervision, Used rails Sidelying to sit: Min assist, HOB elevated, Used rails     Sit to sidelying: Supervision General bed mobility comments: patient attempting to perform without assistance with min assist for trunk    Transfers Overall transfer level: Needs assistance                Lateral/Scoot Transfers: Mod assist General transfer comment: performed lateral scoots towards HOB     Balance Overall balance assessment: Needs assistance Sitting-balance support: Single extremity supported, No upper extremity supported, Feet unsupported Sitting balance-Leahy Scale: Fair Sitting balance - Comments: pt preference for at least single UE support; intermittent minA to prevent posterior LOB with dynamic sitting balance and scooting                                   ADL either performed or assessed with clinical judgement   ADL Overall ADL's : Needs assistance/impaired     Grooming: Set up;Sitting Grooming Details (indicate cue type and reason): EOB Upper Body Bathing: Minimal assistance;Sitting Upper Body Bathing Details (indicate cue type and reason): for back at EOB         Lower Body Dressing: Total assistance;Bed level Lower Body Dressing Details (indicate cue type and reason): to donn socks               General ADL Comments: agreeable to EOB only    Extremity/Trunk Assessment  Vision       Perception     Praxis     Communication Communication Communication: No apparent difficulties   Cognition Arousal: Alert Behavior During Therapy: WFL for tasks assessed/performed Cognition: No apparent impairments                               Following commands:  Intact        Cueing   Cueing Techniques: Verbal cues  Exercises Exercises: General Upper Extremity General Exercises - Upper Extremity Elbow Flexion: Strengthening, Both, 15 reps, Supine, Theraband Theraband Level (Elbow Flexion): Level 2 (Red) Elbow Extension: Strengthening, Both, 15 reps, Supine, Theraband Theraband Level (Elbow Extension): Level 2 (Red)    Shoulder Instructions       General Comments BP supine 137/63 (86), seated on EOB 94/55 (66), 2 minutes EOB 114/57 (74), back in supine 152/70 (91)    Pertinent Vitals/ Pain       Pain Assessment Pain Assessment: Faces Faces Pain Scale: Hurts even more Pain Location: RLE Pain Descriptors / Indicators: Aching, Discomfort, Grimacing Pain Intervention(s): Limited activity within patient's tolerance, Monitored during session, Premedicated before session, Repositioned  Home Living                                          Prior Functioning/Environment              Frequency  Min 2X/week        Progress Toward Goals  OT Goals(current goals can now be found in the care plan section)  Progress towards OT goals: Progressing toward goals  Acute Rehab OT Goals Patient Stated Goal: for less pain OT Goal Formulation: With patient Time For Goal Achievement: 10/01/23 Potential to Achieve Goals: Fair ADL Goals Pt Will Perform Upper Body Dressing: with supervision;with set-up Pt Will Perform Lower Body Dressing: with min assist;sit to/from stand Pt Will Transfer to Toilet: with contact guard assist Pt Will Perform Toileting - Clothing Manipulation and hygiene: with min assist;sitting/lateral leans;sit to/from stand  Plan      Co-evaluation    PT/OT/SLP Co-Evaluation/Treatment: Yes Reason for Co-Treatment: For patient/therapist safety;To address functional/ADL transfers (decreased activity tolerance) PT goals addressed during session: Mobility/safety with mobility;Balance OT goals addressed  during session: ADL's and self-care      AM-PAC OT 6 Clicks Daily Activity     Outcome Measure   Help from another person eating meals?: None Help from another person taking care of personal grooming?: A Little Help from another person toileting, which includes using toliet, bedpan, or urinal?: Total Help from another person bathing (including washing, rinsing, drying)?: A Lot Help from another person to put on and taking off regular upper body clothing?: A Little Help from another person to put on and taking off regular lower body clothing?: Total 6 Click Score: 14    End of Session    OT Visit Diagnosis: Unsteadiness on feet (R26.81);Other abnormalities of gait and mobility (R26.89);Muscle weakness (generalized) (M62.81);Pain Pain - Right/Left: Right Pain - part of body: Ankle and joints of foot   Activity Tolerance Other (comment) (patient limited by dizziness)   Patient Left in bed;with call bell/phone within reach   Nurse Communication Mobility status        Time: 8970-8942 OT Time Calculation (min): 28 min  Charges: OT General Charges $OT Visit: 1  Visit OT Treatments $Therapeutic Exercise: 8-22 mins  Dick Laine, OTA Acute Rehabilitation Services  Office [phone]   Jeb LITTIE Laine 09/22/2023, 12:54 PM

## 2023-09-22 NOTE — Progress Notes (Incomplete)
 Attempted to call 3x to [facility] [phone]. No answer. Called another time while [facility] was here, no answer. Pt given discharge insleaving now.

## 2023-09-22 NOTE — Progress Notes (Signed)
 Patient approved for SNF; transport by [facility]. Help needed moving to and from a bed to a chair (including a wheelchair)?: A Lot Help needed standing up from a chair using your arms (e.g., wheelchair or bedside chair)?: A Lot Help needed to walk in hospital room?: Total

## 2023-09-22 NOTE — Discharge Summary (Signed)
 Name: Harold [name] MRN: [id number] DOB: 11-01-1955 68 y.o. PCP: Patient, No Pcp Per  Date of Admission: 09/16/2023 12:28 PM Date of Discharge: 09/22/2023 Attending Physician: Dr. Reyes Fenton  Discharge Diagnosis: 1. Principal Problem:   Septic arthritis of ankle and foot region [facility]) Active Problems:   Diabetes mellitus type 2 with neurological manifestations (HCC)   Essential hypertension   Neuropathy   Hyperlipidemia associated with type 2 diabetes mellitus (HCC)   CAD, LAD/CFX DES 04/28/12- staged RCA DES 04/29/12 after pt declined CABG, cath 05/14/13 stable CAD medical therapy   COPD (chronic obstructive pulmonary disease) (HCC)   Chronic heart failure with preserved ejection fraction (HFpEF) (HCC)   Gastroesophageal reflux disease without esophagitis   PVD (peripheral vascular disease) (HCC)   CKD stage 3b, GFR 30-44 ml/min (HCC)   Normocytic anemia   PAD (peripheral artery disease) (HCC)   Type 2 diabetes mellitus with diabetic nephropathy, with long-term current use of insulin  (HCC)   Stage 3a chronic kidney disease (HCC)   Type 2 diabetes mellitus with diabetic polyneuropathy, with long-term current use of insulin  [facility])   Discharge Medications: Allergies as of 09/22/2023       Reactions   Beta-lactamase Inhibitors (beta-lactam) Hives, Itching, Rash   AX - penicillin     Ancef  given 9/24 with no obvious reaction   Other Nausea And Vomiting   General Anesthesia - sometimes causes nausea and vomiting * OK to use scopolamine  patch     Wellbutrin [bupropion] Other (See Comments)   Mood Changes   Metformin  And Related Diarrhea, Nausea Only   Sulfa Antibiotics Hives, Other (See Comments)   Bactrim   Tetracyclines & Related Hives, Rash   doxycycline         Medication List     PAUSE taking these medications    amiodarone  200 MG tablet Wait to take this until your doctor or other care provider tells you to start again. Commonly known as: PACERONE  Take 1  tablet (200 mg total) by mouth 2 (two) times daily.       TAKE these medications    allopurinol  100 MG tablet Commonly known as: ZYLOPRIM  Take 0.5 tablets (50 mg total) by mouth daily.   atorvastatin  40 MG tablet Commonly known as: LIPITOR Take 1 tablet (40 mg total) by mouth daily.   clopidogrel  75 MG tablet Commonly known as: PLAVIX  Take 75 mg by mouth every morning.   daptomycin  IVPB Commonly known as: CUBICIN  Inject 800 mg into the vein daily. Indication:  Osteomyelitis  First Dose: Yes Last Day of Therapy:  10/31/23 Labs - Once weekly:  CBC/D, BMP, and CPK Labs - Once weekly: ESR and CRP Method of administration: IV Push Method of administration may be changed at the discretion of home infusion pharmacist based upon assessment of the patient and/or caregiver's ability to self-administer the medication ordered.   DULoxetine  60 MG capsule Commonly known as: CYMBALTA  Take 60 mg by mouth daily.   Eliquis  5 MG Tabs tablet Generic drug: apixaban  TAKE 1 TABLET BY MOUTH TWICE A DAY   ertapenem  IVPB Commonly known as: INVANZ  Inject 1 g into the vein daily. Indication:  Osteomyelitis  First Dose: Yes Last Day of Therapy:  10/31/23 Labs - Once weekly:  CBC/D and BMP, Labs - Once weekly: ESR and CRP Method of administration: Mini-Bag Plus / Gravity Method of administration may be changed at the discretion of home infusion pharmacist based upon assessment of the patient and/or caregiver's ability to self-administer the  medication ordered.   ezetimibe  10 MG tablet Commonly known as: ZETIA  Take 10 mg by mouth every evening.   feeding supplement Liqd Take 237 mLs by mouth 2 (two) times daily between meals.   ferrous sulfate  325 (65 FE) MG tablet Take 325 mg by mouth 3 (three) times a week. Monday, Wednesday, Friday   gabapentin  300 MG capsule Commonly known as: NEURONTIN  Take 1 capsule (300 mg total) by mouth at bedtime.   HYDROcodone -acetaminophen  5-325 MG  tablet Commonly known as: NORCO/VICODIN Take 1 tablet by mouth every 6 (six) hours as needed for moderate pain (pain score 4-6).   hydrocortisone  cream 1 % Apply topically 3 (three) times daily.   insulin  lispro 100 UNIT/ML injection Commonly known as: HUMALOG Inject 0-15 Units into the skin in the morning, at noon, in the evening, and at bedtime. Per sliding scale: 0-70= 0 units if <70 implement hypoglycemic protocol & notify MD/NP 71-150= 0 units 151-200=3 units 201-250= 6 units 251-300= 9 units 301-350= 12 units 351-400= 15 units 401-999 if CBG>400. Notify MD/ NP,   Lantus  SoloStar 100 UNIT/ML Solostar Pen Generic drug: insulin  glargine Inject 8 Units into the skin at bedtime. What changed: how much to take   levothyroxine  88 MCG tablet Commonly known as: SYNTHROID  Take 88 mcg by mouth daily before breakfast.   losartan  25 MG tablet Commonly known as: COZAAR  Take 25 mg by mouth daily.   midodrine  5 MG tablet Commonly known as: PROAMATINE  Take 5 mg by mouth every 8 (eight) hours as needed (SBP < 110).   Mounjaro  15 MG/0.5ML Pen Generic drug: tirzepatide  Inject 15 mg into the skin once a week.   nitroGLYCERIN  0.4 MG SL tablet Commonly known as: NITROSTAT  PLACE 1 TAB UNDER THE TONGUE EVERY 5 MINUTES AS NEEDED FOR CHEST PAIN (SEVERE PRESSURE OR TIGHTNESS)   ondansetron  4 MG tablet Commonly known as: ZOFRAN  Take 1 tablet (4 mg total) by mouth every 8 (eight) hours as needed for nausea or vomiting.   polyethylene glycol 17 g packet Commonly known as: MIRALAX  / GLYCOLAX  Take 17 g by mouth daily as needed for mild constipation.   tamsulosin  0.4 MG Caps capsule Commonly known as: FLOMAX  Take 0.4 mg by mouth.   traMADol 50 MG tablet Commonly known as: ULTRAM Take 50 mg by mouth every 6 (six) hours as needed for moderate pain (pain score 4-6).   Vitamin D  (Ergocalciferol ) 1.25 MG (50000 UNIT) Caps capsule Commonly known as: DRISDOL  Take 1 capsule (50,000 Units  total) by mouth every 7 (seven) days.               Discharge Care Instructions  (From admission, onward)           Start     Ordered   09/22/23 0000  Discharge wound care:       Comments: See discharge instructions for wound care.   09/22/23 1404   09/19/23 0000  Change dressing on IV access line weekly and PRN  (Home infusion instructions - [facility] )        09/19/23 1250            Disposition and follow-up:   Harold [name] was discharged from [facility] in Stable condition.  At the hospital follow up visit please address:  1.   -Osteomyelitis R ankle s/p I&D 8/1: PICC line in on the left arm for IV antibiotics (daptomycin  and ertapenem ). Complete abx regimen recommended by ID. Monitor for signs  of infection at insertion site.  -Afib and Qtc prolongation were controlled during hospital admission although patient discharged with ondansetron , please monitor.   2.  Labs / imaging needed at time of follow-up: CBC with diff to check Hgb, WBC, and eosinophils. CK (and eos) because on daptomycin . CRP and ESR, for resolution of osteomyelitis.   3.  Pending labs/ test needing follow-up: blood cultures finalized no growth at 5 days. Wound cultures taken 8/1 grew enterobacter cloacae, klebsiella pneumo, and staph capitis, still pending results.   Follow-up Appointments:  Follow-up Information     Haddix, Franky SQUIBB, MD. Schedule an appointment as soon as possible for a visit in 2 week(s).   Specialty: Orthopedic Surgery Why: for wound check and repeat x-rays Contact information: [address] [place] [address] [phone]         Overton Constance DASEN, MD. Go on 10/08/2023.   Specialty: Infectious Diseases Why: 10 am Contact information: [address] [place] [address] [phone]                  Hospital Course by problem list: TIPTON BALLOW is a 68 y.o. 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 Hardware removed by Dr. Kendal 06/21/2023. Hospital discharge on 07/22/23 with chronic septic joint completed ertapenem  and daptomycin  on 07/31/2023. On admission, Right digits are swollen, weeping skins lesions needing repeated dressing changes. MRI on 7/29: Suspicious for OM in distal tibia, talus, and calcaneus. Suspected septic tibiotalar, distal tibiofibular, and subtalar joints. IV Linezolid  and change to cefepime  (7/30) until 7/31. Switched to IV dapto and ertapenem  on 7/31. On 7/31 CK 19, ESR 103, CRP 7.6. WBC normal and patient afebrile since 8/1.  Ortho surg and ID spoke with patient. Patient and partner are only okay with irrigation and debridement, not hardware removal. Dr. Ellan performed the I&D on 8/1 with patient understanding this was not the optimal procedure and may require long term/life long abx. Wound cultures grew enterobacter cloacae, klebsiella pneumo, and staph capitis, susceptibilities available.  Held anticoagulation/platelet pending ortho surg, resumed apixaban  and clopidogrel  on 8/2. Patient and spouse were hesitant to restart anticoagulation and antiplatelet as they were afraid it would make his foot worse. After education and recommendations given patients history, patient amendable to restarting.   Plan for patient to discharge with PICC line, placed 8/1, for IV Dapto and ertapenem  until 10/31/23. Outpatient f/u  with ID and ortho. Blood cultures taken 7/29 finalized no growth at 5 days. Gabapentin , tylenol , oxycodone , and hydromorphone  orders in for pain control.    #Peripheral Vascular Disease Follows with Dr. Darron who in Aug 2021 did successful balloon angioplasty to tibioperoneal trunk in posterior tibial artery with drug-coated angioplasty. In sept 2024, patient had reccurent wounds on left leg which were healed after Dr. Darron did successful orbital atherectomy and drug-coated balloon angioplasty to left popliteal artery, TP trunk and posterior tibial artery. After fall in Nov 2024, Dr. Serene did Angiogram which showed calcified stenosis in distal  right SFA, treated with Eluvia drug-eluting stent. OT and PT saw him in the hospital. Clopidogrel  resumed 8/2.    #Qtc prolongation #Paroxysmal atrial fibrillation  -Afib diagnosed in 2023, refractory to [facility] on 03/13/22 and 03/27/22, Dr. Nancey transitioned to medical therapy starting amio Hospitalization in June 2025, patient had Qtc prolongation (likely due to polypharmacy with prolong drugs) treated with amiodarone  with recommendations to avoid further Qtc prolong medications. Continued home amiodarone  200 mg twice daily. Resumed apixaban  5 mg twice daily after surgery. We discharged him with ondansetron , please monitor him for Qtc prolongation.    #Diabetes Mellitus Type 2 In the hospital, goal sugars <180 for wound healing optimization. Glargine 8 units a day and lispro sliding scale. Goal was met while in hospital.    #Chronic Kidney Disease Stage 3b Patient denies symptoms concerning for infection while in the hospital, has been urinating normally. Has had dialysis in the past but not currently.    #Coronary artery disease s/p CABG Continued home Lipitor 40 mg in the hospital. Resumed eliquis  and plavix  after surgery.    #Chronic anemia On admission hgb was 8.1, was at 7.3 on 8/1 before surgery. May transfuse if there was blood loss with surgery.  Remained above 7 while in hospital, 7.3 on discharge.    #Chronic HTN Carvedilol  discontinued in July due to concern for orthostatic hypotension. Patient was hypertensive throughout hospitalization despite home losartan  25 mg. Patients pain level has been high which may correlate. Continue to titrate outpatient.   #OSA on CPAP On most nights, patient opted to use his CPAP machine while in the hospital.    #GAD #Depression Continue duloxetine  20 mg and trazodone  50 mg   #BPH Continue finasteride  5 mg   #GERD Continue pantoprazole  40 mg tablet   #COPD No exacerbation while in hospital.   #Hypothyroidism Continue home levothyroxine  88 mcg   Subjective Patient doing well this morning, feels a little drowsy because of the medicine he just got. He slept well last night, denies SOB, abdominal pain, and changes in urination. His back is not itchy him. His foot is still in pain but the pain is not worse than it has been.  Patients spouse is coming to the hospital later today.  Discussed that we are waiting on labs to make sure his hemoglobin is stable before we send him to [facility]. Patient feels ready to leave the hospital.   Discharge Exam:   BP 132/70 (BP Location: Right Arm)   Pulse 78   Temp (!) 97.5 F (36.4 C) (Oral)   Resp 17   Ht 6' 1 (1.854 m)   Wt 102.5 kg   SpO2 94%   BMI 29.82 kg/m  Discharge exam:  Constitutional:      Appearance: Normal appearance.     Comments: Tired appearing  HENT:     Nose: No congestion or rhinorrhea.  Eyes:     Extraocular Movements: Extraocular movements intact.     Conjunctiva/sclera: Conjunctivae normal.  Cardiovascular:     Rate and Rhythm: Normal rate and regular rhythm.     Heart sounds: Normal heart sounds.  Pulmonary:     Effort: Pulmonary effort is normal.     Breath sounds: Normal breath sounds.  Abdominal:     General: Bowel sounds are normal.     Palpations: Abdomen is soft.  Musculoskeletal:     Right lower leg:  No edema.     Left lower leg: No edema.     Comments: Right  ankle in brace Negative to light sensation of right toes Right toes cold to touch  Skin:    General: Skin is warm and dry.  Neurological:     General: No focal deficit present.     Mental Status: He is oriented to person, place, and time.  Psychiatric:        Mood and Affect: Mood normal.   Pertinent Labs, Studies, and Procedures:     Latest Ref Rng & Units 09/22/2023   12:52 PM 09/21/2023    4:35 AM 09/20/2023    1:30 PM  CBC  WBC 4.0 - 10.5 K/uL 7.6  8.3    Hemoglobin 13.0 - 17.0 g/dL 7.3  7.5  7.2   Hematocrit 39.0 - 52.0 % 23.4  23.3  22.3   Platelets 150 - 400 K/uL 235  264         Latest Ref Rng & Units 09/19/2023    4:39 AM 09/18/2023   11:21 AM 09/17/2023    5:12 AM  CMP  Glucose 70 - 99 mg/dL 850  823  782   BUN 8 - 23 mg/dL 13  14  18    Creatinine 0.61 - 1.24 mg/dL 8.26  8.10  8.41   Sodium 135 - 145 mmol/L 136  136  134   Potassium 3.5 - 5.1 mmol/L 3.6  4.2  3.7   Chloride 98 - 111 mmol/L 106  105  104   CO2 22 - 32 mmol/L 21  20  21    Calcium  8.9 - 10.3 mg/dL 8.3  8.5  8.2     MR FOOT RIGHT W WO CONTRAST Result Date: 09/17/2023 CLINICAL DATA:  Soft tissue infection EXAM: MRI OF THE RIGHT FOOT WITHOUT AND WITH CONTRAST TECHNIQUE: Multiplanar, multisequence MR imaging of the right foot from the midfoot through the toes was performed before and after the administration of intravenous contrast. CONTRAST:  10mL GADAVIST  GADOBUTROL  1 MMOL/ML IV SOLN COMPARISON:  06/19/2023 FINDINGS: Bones/Joint/Cartilage Please see dedicated MRI ankle for findings in the hindfoot and proximal midfoot. Spurring and degenerative subcortical cyst formation along the first MTP joint and the articulation of first metatarsal head with the sesamoids. No findings of forefoot osteomyelitis. Ligaments Lisfranc ligament intact. Muscles and Tendons Low-grade diffuse muscular edema potentially from neurogenic edema or generalized third spacing of  fluid. No intramuscular abscess. Soft tissues Extensive dorsal subcutaneous edema in the foot with some enhancement, cellulitis is a distinct possibility. This edema tracks mildly into the toes. No drainable soft tissue abscess identified. IMPRESSION: 1. Extensive dorsal subcutaneous edema in the forefoot foot with some enhancement, cellulitis is a distinct possibility. No drainable soft tissue abscess identified. 2. No findings of forefoot osteomyelitis. 3. Low-grade diffuse muscular edema potentially from neurogenic edema or generalized third spacing of fluid. 4. Degenerative spurring and subcortical cyst formation along the first MTP joint and the articulation of first metatarsal head with the sesamoids. Electronically Signed   By: Ryan Salvage M.D.   On: 09/17/2023 08:05   MR ANKLE RIGHT W WO CONTRAST Result Date: 09/17/2023 CLINICAL DATA:  Soft tissue infection EXAM: MRI OF THE RIGHT ANKLE WITHOUT AND WITH CONTRAST TECHNIQUE: Multiplanar, multisequence MR imaging of the ankle was performed before and after the administration of intravenous contrast. CONTRAST:  10mL GADAVIST  GADOBUTROL  1 MMOL/ML IV SOLN COMPARISON:  Radiographs 09/16/2023 FINDINGS: TENDONS Peroneal: Common peroneus tendon sheath tenosynovitis. Moderate peroneus brevis peroneus longus tendinopathy Posteromedial: Substantial distal tibialis posterior tendinopathy. Indistinctness of the tibialis posterior and possibly partial  tearing in the vicinity of what appears to be a draining sinus tract from the tibiotalar joint to the cutaneous surface posteromedially. Anterior: Mild distal tibialis anterior tendinopathy. Achilles: Mild distal Achilles tendinopathy. Plantar Fascia: Thickened medial band of the plantar fascia suspicious for plantar fasciitis. LIGAMENTS Lateral: Thickening and some indistinctness of portions the anterior talofibular ligament, query prior injury. Medial: Edema within the deep tibiotalar portion of the deltoid ligament,  compatible with sprain or partial tear. CARTILAGE Ankle Joint: IM nail extends through the calcaneus, talus, and into the tibia. Fluid density surrounds this IM nail and there is an effusion of the tibiotalar joint with what appears to be a draining sinus tract from the tibiotalar joint extending posterior to the medial malleolus and anterior to the tibialis posterior tendon to the cutaneous surface as shown on images 11 through 13 series 4. Synovitis, marrow edema, bony destructive findings observed suspicious for osteomyelitis. There appears to be a posterior malleolar fragment suggesting prior posterior malleolar fracture. Abnormal edema and erosions of the talus noted. Subtalar Joints/Sinus Tarsi: The IM nail traverses the subtalar joint with abnormal edema in the calcaneus and talus, suspicion for infection around the IM nail, and effusion of the subtalar joints which are likely septic. Bones: As noted above there is edema in the distal tibia, talus, calcaneus suspicious for osteomyelitis with lucency surrounding the IM nail. Fluid-filled track through and along the medial posterior calcaneus on image 26 series 4, possibly from prior hardware placement but a draining sinus tract is not excluded. Suspected septic tibiotalar, distal tibiofibular, and subtalar joints. Marrow edema in the navicular could represent early osteomyelitis. Prior screw tracks in the calcaneus likely from remote external fixator. Other prior hardware tracks in the calcaneus noted posteriorly. Disruption of the subcutaneous tissues colinear with the IM nail noted along the plantar foot, probably incidental although infection in this region is not excluded. Separate fragments of the posterior malleolus and medial malleolus. Other: Extensive subcutaneous edema in the ankle suspicious for cellulitis. IMPRESSION: IMPRESSION 1. Findings are suspicious for osteomyelitis of the distal tibia, talus, and calcaneus, with lucency surrounding the IM  nail. 2. Fracture fragments of the posterior malleolus and medial malleolus noted. 3. Suspected septic tibiotalar, distal tibiofibular, and subtalar joints. 4. Fluid-filled track through and along the medial posterior calcaneus, possibly from prior hardware placement but a draining sinus tract is not excluded. 5. Disruption of the subcutaneous tissues colinear with the IM nail along the plantar foot, probably incidental although infection in this region is not excluded. 6. Extensive subcutaneous edema in the ankle suspicious for cellulitis. 7. Substantial distal tibialis posterior tendinopathy with indistinctness and possibly partial tearing in the vicinity of what appears to be a draining sinus tract from the tibiotalar joint to the cutaneous surface. 8. Common peroneus tendon sheath tenosynovitis. Moderate peroneus brevis peroneus longus tendinopathy. 9. Mild distal tibialis anterior tendinopathy. 10. Mild distal Achilles tendinopathy. 11. Thickened medial band of the plantar fascia suspicious for plantar fasciitis. 12. Thickening and indistinctness of portions of the anterior talofibular ligament, query prior injury. 13. Edema within the deep tibiotalar portion of the deltoid ligament, compatible with sprain or partial tear. Electronically Signed   By: Ryan Salvage M.D.   On: 09/17/2023 08:01   DG Ankle Complete Right Result Date: 09/16/2023 CLINICAL DATA:  Ankle infection. EXAM: RIGHT ANKLE - COMPLETE 3+ VIEW COMPARISON:  None Available. FINDINGS: Fixation rod traversing the calcaneus, talus, and tibia. There is old fracture of the distal fibula with callus formation.  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 There is diffuse skin thickening and edema. Vascular calcification noted. No soft tissue gas. IMPRESSION: 1. No acute fracture or dislocation. 2. Diffuse skin thickening and edema. Electronically Signed   By: Vanetta Chou M.D.   On: 09/16/2023 14:05     Discharge  Instructions: Discharge Instructions     [facility] pharmacist to adjust dose for Vancomycin , Aminoglycosides and other anti-infective therapies as requested by physician.   Complete by: As directed    [facility] to provide Cath Flo 2mg    Complete by: As directed    Administer for PICC line occlusion and as ordered by physician for other access device issues.   Anaphylaxis Kit: Provided to treat any anaphylactic reaction to the medication being provided to the patient if First Dose or when requested by physician   Complete by: As directed    Epinephrine  1mg /ml vial / amp: Administer 0.3mg  (0.34ml) subcutaneously once for moderate to severe anaphylaxis, nurse to call physician and pharmacy when reaction occurs and call 911 if needed for immediate care   Diphenhydramine  50mg /ml IV vial: Administer 25-50mg  IV/IM PRN for first dose reaction, rash, itching, mild reaction, nurse to call physician and pharmacy when reaction occurs   Sodium Chloride  0.9% NS 500ml IV: Administer if needed for hypovolemic blood pressure drop or as ordered by physician after call to physician with anaphylactic reaction   Call MD for:  difficulty breathing, headache or visual disturbances   Complete by: As directed    Call MD for:  persistant nausea and vomiting   Complete by: As directed    Call MD for:  redness, tenderness, or signs of infection (pain, swelling, redness, odor or green/yellow discharge around incision site)   Complete by: As directed    Call MD for:  severe uncontrolled pain   Complete by: As directed    Call MD for:  temperature >100.4   Complete by: As directed    Change dressing on IV access line weekly and PRN   Complete by: As directed    Diet Carb Modified   Complete by: As directed    Discharge instructions   Complete by: As directed    General Discharge Instructions  WEIGHT BEARING STATUS:Weightbearing as tolerated right lower extremity  RANGE OF  MOTION/ACTIVITY:As tolerated  Wound Care: You may remove your surgical dressing on post op day #3 (Monday 09/22/23). Incisions can be left open to air if there is no drainage. Once the incision is completely dry and without drainage, it may be left open to air out.  Showering may begin post op day #4 (Tuesday 09/23/23).  Clean incision gently with soap and water.  DVT/PE prophylaxis:  You may resume your home dose anticoagulation starting Monday 09/22/23  Diet: as you were eating previously.  Can use over the counter stool softeners and bowel preparations, such as Miralax , to help with bowel movements.  Narcotics can be constipating.  Be sure to drink plenty of fluids  PAIN MEDICATION USE AND EXPECTATIONS  You have likely been given narcotic medications to help control your pain.  After a traumatic event that results in an fracture (broken bone) with or without surgery, it is ok to use narcotic pain medications to help control one's pain.  We understand that everyone responds to pain differently and each individual patient will be evaluated on a regular basis for the continued need for narcotic medications. Ideally, narcotic medication use should last no more  than 6-8 weeks (coinciding with fracture healing).   As a patient it is your responsibility as well to monitor narcotic medication use and report the amount and frequency you use these medications when you come to your office visit.   We would also advise that if you are using narcotic medications, you should take a dose prior to therapy to maximize you participation.  IF YOU ARE ON NARCOTIC MEDICATIONS IT IS NOT PERMISSIBLE TO OPERATE A MOTOR VEHICLE (MOTORCYCLE/CAR/TRUCK/MOPED) OR HEAVY MACHINERY DO NOT MIX NARCOTICS WITH OTHER CNS (CENTRAL NERVOUS SYSTEM) DEPRESSANTS SUCH AS ALCOHOL  POST-OPERATIVE OPIOID TAPER INSTRUCTIONS:  It is important to wean off of your opioid medication as soon as possible. If you do not need pain medication after your  surgery it is ok to stop day one.  Opioids include:  o Codeine, Hydrocodone (Norco, Vicodin), Oxycodone (Percocet, oxycontin ) and hydromorphone  amongst others.   Long term and even short term use of opiods can cause:  o Increased pain response  o Dependence  o Constipation  o Depression  o Respiratory depression  o And more.   Withdrawal symptoms can include  o Flu like symptoms  o Nausea, vomiting  o And more  Techniques to manage these symptoms  o Hydrate well  o Eat regular healthy meals  o Stay active  o Use relaxation techniques(deep breathing, meditating, yoga)  Do Not substitute Alcohol to help with tapering  If you have been on opioids for less than two weeks and do not have pain than it is ok to stop all together.   Plan to wean off of opioids  o This plan should start within one week post op of your fracture surgery   o Maintain the same interval or time between taking each dose and first decrease the dose.   o Cut the total daily intake of opioids by one tablet each day  o Next start to increase the time between doses.  o The last dose that should be eliminated is the evening dose.    STOP SMOKING OR USING NICOTINE PRODUCTS!!!!  As discussed nicotine severely impairs your body's ability to heal surgical and traumatic wounds but also impairs bone healing.  Wounds and bone heal by forming microscopic blood vessels (angiogenesis) and nicotine is a vasoconstrictor (essentially, shrinks blood vessels).  Therefore, if vasoconstriction occurs to these microscopic blood vessels they essentially disappear and are unable to deliver necessary nutrients to the healing tissue.  This is one modifiable factor that you can do to dramatically increase your chances of healing your injury.  (This means no smoking, no nicotine gum, patches, etc)  DO NOT USE NONSTEROIDAL ANTI-INFLAMMATORY DRUGS (NSAID'S)  Using products such as Advil (ibuprofen), Aleve (naproxen), Motrin (ibuprofen) for  additional pain control during fracture healing can delay and/or prevent the healing response.  If you would like to take over the counter (OTC) medication, Tylenol  (acetaminophen ) is ok.  However, some narcotic medications that are given for pain control contain acetaminophen  as well. Therefore, you should not exceed more than 4000 mg of tylenol  in a day if you do not have liver disease.  Also note that there are may OTC medicines, such as cold medicines and allergy medicines that my contain tylenol  as well.  If you have any questions about medications and/or interactions please ask your doctor/PA or your pharmacist.      ICE AND ELEVATE INJURED/OPERATIVE EXTREMITY  Using ice and elevating the injured extremity above your heart can help with swelling and pain  control.  Icing in a pulsatile fashion, such as 20 minutes on and 20 minutes off, can be followed.    Do not place ice directly on skin. Make sure there is a barrier between to skin and the ice pack.    Using frozen items such as frozen peas works well as the conform nicely to the are that needs to be iced.  USE AN ACE WRAP OR TED HOSE FOR SWELLING CONTROL  In addition to icing and elevation, Ace wraps or TED hose are used to help limit and resolve swelling.  It is recommended to use Ace wraps or TED hose until you are informed to stop.    When using Ace Wraps start the wrapping distally (farthest away from the body) and wrap proximally (closer to the body)   Example: If you had surgery on your leg or thing and you do not have a splint on, start the ace wrap at the toes and work your way up to the thigh        If you had surgery on your upper extremity and do not have a splint on, start the ace wrap at your fingers and work your way up to the upper arm   CALL THE OFFICE FOR MEDICATION REFILLS OR WITH ANY QUESTIONS/CONCERNS: [phone]   VISIT OUR WEBSITE FOR ADDITIONAL INFORMATION: [email or web address]    Discharge Wound Care  Instructions  Do NOT apply any ointments, solutions or lotions to pin sites or surgical wounds.  These prevent needed drainage and even though solutions like hydrogen peroxide kill bacteria, they also damage cells lining the pin sites that help fight infection.  Applying lotions or ointments can keep the wounds moist and can cause them to breakdown and open up as well. This can increase the risk for infection. When in doubt call the office.  Surgical incisions should be dressed daily.  If any drainage is noted, use one layer of adaptic or Mepitel, then gauze, Kerlix, and an ace wrap. - These dressing supplies should be available at local medical supply stores ([facility], [facility], etc) as well as Insurance claims handler ([facility], [facility], [facility], etc)  Once the incision is completely dry and without drainage, it may be left open to air out.  Showering may begin 36-48 hours later.  Cleaning gently with soap and water.  Traumatic wounds should be dressed daily as well.    One layer of adaptic, gauze, Kerlix, then ace wrap.  The adaptic can be discontinued once the draining has ceased    If you have a wet to dry dressing: wet the gauze with saline the squeeze as much saline out so the gauze is moist (not soaking wet), place moistened gauze over wound, then place a dry gauze over the moist one, followed by Kerlix wrap, then ace wrap.    Call office for the following: ? Temperature greater than 101F ? Persistent nausea and vomiting ? Severe uncontrolled pain ? Redness, tenderness, or signs of infection (pain, swelling, redness, odor or green/yellow discharge around the site) ? Difficulty breathing, headache or visual disturbances ? Hives ? Persistent dizziness or light-headedness ? Extreme fatigue ? Any other questions or concerns you may have after discharge  In an emergency, call 911 or go to an Emergency Department at a nearby hospital  OTHER HELPFUL INFORMATION  ? If you had a  block, it will wear off between 8-24 hrs postop typically.  This is period when your pain may go from nearly zero to  the pain you would have had postop without the block.  This is an abrupt transition but nothing dangerous is happening.  You may take an extra dose of narcotic when this happens.  ? You should wean off your narcotic medicines as soon as you are able.  Most patients will be off or using minimal narcotics before their first postop appointment.   ? We suggest you use the pain medication the first night prior to going to bed, in order to ease any pain when the anesthesia wears off. You should avoid taking pain medications on an empty stomach as it will make you nauseous.  ? Do not drink alcoholic beverages or take illicit drugs when taking pain medications.  ? In most states it is against the law to drive while you are in a splint or sling.  And certainly against the law to drive while taking narcotics.  ? You may return to work/school in the next couple of days when you feel up to it.   ? Pain medication may make you constipated.  Below are a few solutions to try in this order:   ? Decrease the amount of pain medication if you aren't having pain.   ? Drink lots of decaffeinated fluids.   ? Drink prune juice and/or each dried prunes   o If the first 3 don't work start with additional solutions   ? Take Colace - an over-the-counter stool softener   ? Take Senokot - an over-the-counter laxative   ? Take Miralax  - a stronger over-the-counter laxative  Thank you for allowing the Internal Medicine Team to be part of your care. You were hospitalized for osteomyelitis. We treated you with antibiotics and pain medicine, in addition to the surgery you have with orthopedic surgery.   See the changes in your medications and management of your chronic conditions below:  *For your osteomyelitis -We have STARTED you on these following medications:  - daptomycin , an antibiotic   - ertapenem ,  an antibiotic   - ondansetron , for nausea  - hydrocodone -acetaminophen , for pain  -We have not stopped any of your at home medications. Please continue taking these as prescribed.   FOLLOW UP APPOINTMENTS:  Please visit Dr. Overton (infectious disease doctor) on August 20th. Please call and make an appointment (in two weeks) with your orthopedic surgery team to follow up on your surgery and for amputation education.   Please make sure to monitor your PICC line for signs of infection (redness, pain, swelling, oozing).   Please call your PCP or our clinic if you have any questions or concerns, we may be able to help and keep you from a long and expensive emergency room wait. Our clinic and after hours phone number is [phone]. The best time to call is Monday through Friday 9 am to 4 pm but there is always someone available 24/7 if you have an emergency. If you need medication refills please notify your pharmacy one week in advance and they will send us  a request.   We are glad you are feeling better,  Viktoria King Internal Medicine Inpatient Teaching Service at [facility]   Discharge wound care:   Complete by: As directed    See discharge instructions for wound care.   Flush IV access with Sodium Chloride  0.9% and Heparin  10 units/ml or 100 units/ml   Complete by: As directed    Home infusion instructions - [facility]   Complete by: As directed    Instructions:  Flush IV access with Sodium Chloride  0.9% and Heparin  10units/ml or 100units/ml   Change dressing on IV access line: Weekly and PRN   Instructions Cath Flo 2mg : Administer for PICC Line occlusion and as ordered by physician for other access device   [facility] pharmacist to adjust dose for: Vancomycin , Aminoglycosides and other anti-infective therapies as requested by physician   Increase activity slowly   Complete by: As directed    Method of administration may be changed at the discretion of home infusion  pharmacist based upon assessment of the patient and/or caregiver's ability to self-administer the medication ordered   Complete by: As directed        Signed: Charmayne Holmes, DO 09/22/2023, 2:05 PM

## 2023-09-22 NOTE — Progress Notes (Signed)
 Physical Therapy Treatment Patient Details Name: Harold [name] MRN: [id number] DOB: Mar 18, 1955 Today's Date: 09/22/2023   History of Present Illness 68 y.o. 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 Other PMH includes HTN, CAD, COPD, DM2, CKD, PVD, gout, afib, neuropathy, obesity.   PT Comments  Pt slowly progressing with mobility. Today's session focused on seated EOB activity and supine LE therex. Pt limited by c/o pain and dizziness with sitting, adamantly declines OOB transfer attempt (pt reports he transferred to recliner this AM with assist from nursing). Pt remains limited by generalized weakness, decreased activity tolerance, pain and impaired balance strategies/postural reactions. Continue to recommend SNF-level therapies (<3 hrs/day) to maximize functional mobility and independence.  Orthostatic BPs Supine 137/63  Sitting 94/55  Sitting after 2-min 114/57  Return to supine 152/70       If plan is discharge home, recommend the following: Two people to help with walking and/or transfers;Two people to help with bathing/dressing/bathroom   Can travel by private vehicle     No  Equipment Recommendations  None recommended by PT    Recommendations for Other Services       Precautions / Restrictions Precautions Precautions: Fall;Other (comment) Recall of Precautions/Restrictions: Intact Precaution/Restrictions Comments: symptomatic orthostatic hypotension (but BP recovered while sitting EOB) Restrictions Other Position/Activity Restrictions: pt preference for R ankle NWB     Mobility  Bed Mobility Overal bed mobility: Needs Assistance Bed Mobility:  Rolling, Sidelying to Sit, Sit to Sidelying Rolling: Supervision, Used rails Sidelying to sit: Min assist, HOB elevated, Used rails     Sit to sidelying: Supervision General bed mobility comments: minA for trunk elevation to sitting EOB; return to supine with increased time secondary to c/o pain and dizziness; pt able to scoot self up with use of bed rails in trendelenberg position    Transfers Overall transfer level: Needs assistance                Lateral/Scoot Transfers: Mod assist General transfer comment: pt able to minimally scoot along EOB with intermittent modA for hip/LE management, increased time and difficulty; pt adamantly declines standing or OOB transfers secondary to c/o pain and dizziness    Ambulation/Gait                   Stairs             Wheelchair Mobility     Tilt Bed    Modified Rankin (Stroke Patients Only)       Balance Overall balance assessment: Needs assistance Sitting-balance support: Single extremity supported, No upper extremity supported, Feet unsupported Sitting balance-Leahy Scale: Fair Sitting balance - Comments: pt preference for at least single UE support; intermittent minA to prevent posterior LOB with dynamic sitting balance and scooting                                    Communication Communication Communication: No apparent difficulties  Cognition Arousal: Alert Behavior During Therapy: WFL for tasks assessed/performed   PT - Cognitive impairments: No apparent impairments  Following commands: Intact      Cueing Cueing Techniques: Verbal cues  Exercises Other Exercises Other Exercises: supine L SLR (partial range), able to initiate L single leg bridge but unable to clear buttocks therefore did isometric holds with L foot planted on bed surface, L hip abd/add Other Exercises: supine RLE SAQ, partial range hip abd/add Other Exercises: [employer] HEP handout  provided for BLE strengthening    General Comments General comments (skin integrity, edema, etc.): pt self-limiting with c/o pain requiring increased motivation to participate and progress mobility; max encouragement and educ re: role of acute PT, activity recommendations, importance of mobility, upright sitting (even if just HOB elevated) for BP regulation, therex/HEP      Pertinent Vitals/Pain Pain Assessment Pain Assessment: Faces Faces Pain Scale: Hurts even more Pain Location: RLE Pain Descriptors / Indicators: Aching, Discomfort, Grimacing Pain Intervention(s): Monitored during session, Limited activity within patient's tolerance, Repositioned, Premedicated before session    Home Living                          Prior Function            PT Goals (current goals can now be found in the care plan section) Progress towards PT goals: Progressing toward goals (slowly)    Frequency    Min 2X/week      PT Plan      Co-evaluation PT/OT/SLP Co-Evaluation/Treatment: Yes Reason for Co-Treatment: For patient/therapist safety;To address functional/ADL transfers (decreased activity tolerance) PT goals addressed during session: Mobility/safety with mobility;Balance        AM-PAC PT 6 Clicks Mobility   Outcome Measure  Help needed turning from your back to your side while in a flat bed without using bedrails?: A Little Help needed moving from lying on your back to sitting on the side of a flat bed without using bedrails?: A Little Help needed moving to and from a bed to a chair (including a wheelchair)?: A Lot Help needed standing up from a chair using your arms (e.g., wheelchair or bedside chair)?: A Lot Help needed to walk in hospital room?: Total Help needed climbing 3-5 steps with a railing? : Total 6 Click Score: 12    End of Session   Activity Tolerance: Patient limited by pain;Treatment limited secondary to medical complications (Comment) (symptomatic  hypotension) Patient left: in bed;with call bell/phone within reach;with bed alarm set Nurse Communication: Mobility status PT Visit Diagnosis: Pain;Other abnormalities of gait and mobility (R26.89) Pain - Right/Left: Right Pain - part of body: Ankle and joints of foot     Time: 1029-1057 PT Time Calculation (min) (ACUTE ONLY): 28 min  Charges:    $Therapeutic Activity: 23-37 mins PT General Charges $$ ACUTE PT VISIT: 1 Visit                     Darice Almas, PT, DPT Acute Rehabilitation Services  Personal: Secure Chat Rehab Office: [phone]  Darice LITTIE Almas 09/22/2023, 12:40 PM

## 2023-09-23 DIAGNOSIS — I480 Paroxysmal atrial fibrillation: Secondary | ICD-10-CM | POA: Diagnosis not present

## 2023-09-23 DIAGNOSIS — I10 Essential (primary) hypertension: Secondary | ICD-10-CM | POA: Diagnosis not present

## 2023-09-23 DIAGNOSIS — I5020 Unspecified systolic (congestive) heart failure: Secondary | ICD-10-CM | POA: Diagnosis not present

## 2023-09-23 DIAGNOSIS — M6281 Muscle weakness (generalized): Secondary | ICD-10-CM | POA: Diagnosis not present

## 2023-09-23 DIAGNOSIS — L97411 Non-pressure chronic ulcer of right heel and midfoot limited to breakdown of skin: Secondary | ICD-10-CM | POA: Diagnosis not present

## 2023-09-23 DIAGNOSIS — I70234 Atherosclerosis of native arteries of right leg with ulceration of heel and midfoot: Secondary | ICD-10-CM | POA: Diagnosis not present

## 2023-09-23 DIAGNOSIS — L02415 Cutaneous abscess of right lower limb: Secondary | ICD-10-CM | POA: Diagnosis not present

## 2023-09-23 DIAGNOSIS — I2510 Atherosclerotic heart disease of native coronary artery without angina pectoris: Secondary | ICD-10-CM | POA: Diagnosis not present

## 2023-09-23 DIAGNOSIS — E43 Unspecified severe protein-calorie malnutrition: Secondary | ICD-10-CM | POA: Diagnosis not present

## 2023-09-23 DIAGNOSIS — I4581 Long QT syndrome: Secondary | ICD-10-CM | POA: Diagnosis not present

## 2023-09-23 DIAGNOSIS — T84624D Infection and inflammatory reaction due to internal fixation device of right fibula, subsequent encounter: Secondary | ICD-10-CM | POA: Diagnosis not present

## 2023-09-23 DIAGNOSIS — E1165 Type 2 diabetes mellitus with hyperglycemia: Secondary | ICD-10-CM | POA: Diagnosis not present

## 2023-09-23 DIAGNOSIS — M86171 Other acute osteomyelitis, right ankle and foot: Secondary | ICD-10-CM | POA: Diagnosis not present

## 2023-09-23 DIAGNOSIS — N1832 Chronic kidney disease, stage 3b: Secondary | ICD-10-CM | POA: Diagnosis not present

## 2023-09-23 DIAGNOSIS — E1140 Type 2 diabetes mellitus with diabetic neuropathy, unspecified: Secondary | ICD-10-CM | POA: Diagnosis not present

## 2023-09-23 LAB — AEROBIC CULTURE W GRAM STAIN (SUPERFICIAL SPECIMEN): Gram Stain: NONE SEEN

## 2023-09-24 DIAGNOSIS — E1165 Type 2 diabetes mellitus with hyperglycemia: Secondary | ICD-10-CM | POA: Diagnosis not present

## 2023-09-24 DIAGNOSIS — E1140 Type 2 diabetes mellitus with diabetic neuropathy, unspecified: Secondary | ICD-10-CM | POA: Diagnosis not present

## 2023-09-24 DIAGNOSIS — L97411 Non-pressure chronic ulcer of right heel and midfoot limited to breakdown of skin: Secondary | ICD-10-CM | POA: Diagnosis not present

## 2023-09-24 DIAGNOSIS — M6281 Muscle weakness (generalized): Secondary | ICD-10-CM | POA: Diagnosis not present

## 2023-09-24 DIAGNOSIS — I480 Paroxysmal atrial fibrillation: Secondary | ICD-10-CM | POA: Diagnosis not present

## 2023-09-24 DIAGNOSIS — E43 Unspecified severe protein-calorie malnutrition: Secondary | ICD-10-CM | POA: Diagnosis not present

## 2023-09-24 DIAGNOSIS — I4581 Long QT syndrome: Secondary | ICD-10-CM | POA: Diagnosis not present

## 2023-09-24 DIAGNOSIS — I70234 Atherosclerosis of native arteries of right leg with ulceration of heel and midfoot: Secondary | ICD-10-CM | POA: Diagnosis not present

## 2023-09-24 DIAGNOSIS — N1832 Chronic kidney disease, stage 3b: Secondary | ICD-10-CM | POA: Diagnosis not present

## 2023-09-24 DIAGNOSIS — T84624D Infection and inflammatory reaction due to internal fixation device of right fibula, subsequent encounter: Secondary | ICD-10-CM | POA: Diagnosis not present

## 2023-09-24 DIAGNOSIS — I10 Essential (primary) hypertension: Secondary | ICD-10-CM | POA: Diagnosis not present

## 2023-09-24 DIAGNOSIS — M86171 Other acute osteomyelitis, right ankle and foot: Secondary | ICD-10-CM | POA: Diagnosis not present

## 2023-09-25 DIAGNOSIS — I10 Essential (primary) hypertension: Secondary | ICD-10-CM | POA: Diagnosis not present

## 2023-09-25 DIAGNOSIS — E1140 Type 2 diabetes mellitus with diabetic neuropathy, unspecified: Secondary | ICD-10-CM | POA: Diagnosis not present

## 2023-09-25 DIAGNOSIS — E1165 Type 2 diabetes mellitus with hyperglycemia: Secondary | ICD-10-CM | POA: Diagnosis not present

## 2023-09-25 DIAGNOSIS — M86171 Other acute osteomyelitis, right ankle and foot: Secondary | ICD-10-CM | POA: Diagnosis not present

## 2023-09-25 DIAGNOSIS — I480 Paroxysmal atrial fibrillation: Secondary | ICD-10-CM | POA: Diagnosis not present

## 2023-09-25 DIAGNOSIS — M6281 Muscle weakness (generalized): Secondary | ICD-10-CM | POA: Diagnosis not present

## 2023-09-25 DIAGNOSIS — E43 Unspecified severe protein-calorie malnutrition: Secondary | ICD-10-CM | POA: Diagnosis not present

## 2023-09-25 DIAGNOSIS — T84624D Infection and inflammatory reaction due to internal fixation device of right fibula, subsequent encounter: Secondary | ICD-10-CM | POA: Diagnosis not present

## 2023-09-25 DIAGNOSIS — I4581 Long QT syndrome: Secondary | ICD-10-CM | POA: Diagnosis not present

## 2023-09-25 DIAGNOSIS — N1832 Chronic kidney disease, stage 3b: Secondary | ICD-10-CM | POA: Diagnosis not present

## 2023-09-25 DIAGNOSIS — L97411 Non-pressure chronic ulcer of right heel and midfoot limited to breakdown of skin: Secondary | ICD-10-CM | POA: Diagnosis not present

## 2023-09-25 DIAGNOSIS — I70234 Atherosclerosis of native arteries of right leg with ulceration of heel and midfoot: Secondary | ICD-10-CM | POA: Diagnosis not present

## 2023-09-26 DIAGNOSIS — I480 Paroxysmal atrial fibrillation: Secondary | ICD-10-CM | POA: Diagnosis not present

## 2023-09-26 DIAGNOSIS — E1165 Type 2 diabetes mellitus with hyperglycemia: Secondary | ICD-10-CM | POA: Diagnosis not present

## 2023-09-26 DIAGNOSIS — I70234 Atherosclerosis of native arteries of right leg with ulceration of heel and midfoot: Secondary | ICD-10-CM | POA: Diagnosis not present

## 2023-09-26 DIAGNOSIS — M6281 Muscle weakness (generalized): Secondary | ICD-10-CM | POA: Diagnosis not present

## 2023-09-26 DIAGNOSIS — I4581 Long QT syndrome: Secondary | ICD-10-CM | POA: Diagnosis not present

## 2023-09-26 DIAGNOSIS — I10 Essential (primary) hypertension: Secondary | ICD-10-CM | POA: Diagnosis not present

## 2023-09-26 DIAGNOSIS — T84624D Infection and inflammatory reaction due to internal fixation device of right fibula, subsequent encounter: Secondary | ICD-10-CM | POA: Diagnosis not present

## 2023-09-26 DIAGNOSIS — E43 Unspecified severe protein-calorie malnutrition: Secondary | ICD-10-CM | POA: Diagnosis not present

## 2023-09-26 DIAGNOSIS — M86171 Other acute osteomyelitis, right ankle and foot: Secondary | ICD-10-CM | POA: Diagnosis not present

## 2023-09-26 DIAGNOSIS — N1832 Chronic kidney disease, stage 3b: Secondary | ICD-10-CM | POA: Diagnosis not present

## 2023-09-26 DIAGNOSIS — E1140 Type 2 diabetes mellitus with diabetic neuropathy, unspecified: Secondary | ICD-10-CM | POA: Diagnosis not present

## 2023-09-26 DIAGNOSIS — L97411 Non-pressure chronic ulcer of right heel and midfoot limited to breakdown of skin: Secondary | ICD-10-CM | POA: Diagnosis not present

## 2023-09-29 DIAGNOSIS — L97411 Non-pressure chronic ulcer of right heel and midfoot limited to breakdown of skin: Secondary | ICD-10-CM | POA: Diagnosis not present

## 2023-09-29 DIAGNOSIS — I70234 Atherosclerosis of native arteries of right leg with ulceration of heel and midfoot: Secondary | ICD-10-CM | POA: Diagnosis not present

## 2023-09-29 DIAGNOSIS — M86171 Other acute osteomyelitis, right ankle and foot: Secondary | ICD-10-CM | POA: Diagnosis not present

## 2023-09-29 DIAGNOSIS — M6281 Muscle weakness (generalized): Secondary | ICD-10-CM | POA: Diagnosis not present

## 2023-09-29 DIAGNOSIS — T84624D Infection and inflammatory reaction due to internal fixation device of right fibula, subsequent encounter: Secondary | ICD-10-CM | POA: Diagnosis not present

## 2023-09-29 DIAGNOSIS — N1832 Chronic kidney disease, stage 3b: Secondary | ICD-10-CM | POA: Diagnosis not present

## 2023-09-29 DIAGNOSIS — E43 Unspecified severe protein-calorie malnutrition: Secondary | ICD-10-CM | POA: Diagnosis not present

## 2023-09-29 DIAGNOSIS — I10 Essential (primary) hypertension: Secondary | ICD-10-CM | POA: Diagnosis not present

## 2023-09-29 DIAGNOSIS — I480 Paroxysmal atrial fibrillation: Secondary | ICD-10-CM | POA: Diagnosis not present

## 2023-09-29 DIAGNOSIS — E1165 Type 2 diabetes mellitus with hyperglycemia: Secondary | ICD-10-CM | POA: Diagnosis not present

## 2023-09-29 DIAGNOSIS — I4581 Long QT syndrome: Secondary | ICD-10-CM | POA: Diagnosis not present

## 2023-09-29 DIAGNOSIS — E1140 Type 2 diabetes mellitus with diabetic neuropathy, unspecified: Secondary | ICD-10-CM | POA: Diagnosis not present

## 2023-09-30 DIAGNOSIS — I2510 Atherosclerotic heart disease of native coronary artery without angina pectoris: Secondary | ICD-10-CM | POA: Diagnosis not present

## 2023-09-30 DIAGNOSIS — I5020 Unspecified systolic (congestive) heart failure: Secondary | ICD-10-CM | POA: Diagnosis not present

## 2023-09-30 DIAGNOSIS — N1832 Chronic kidney disease, stage 3b: Secondary | ICD-10-CM | POA: Diagnosis not present

## 2023-09-30 DIAGNOSIS — M6281 Muscle weakness (generalized): Secondary | ICD-10-CM | POA: Diagnosis not present

## 2023-09-30 DIAGNOSIS — L97411 Non-pressure chronic ulcer of right heel and midfoot limited to breakdown of skin: Secondary | ICD-10-CM | POA: Diagnosis not present

## 2023-09-30 DIAGNOSIS — E1140 Type 2 diabetes mellitus with diabetic neuropathy, unspecified: Secondary | ICD-10-CM | POA: Diagnosis not present

## 2023-09-30 DIAGNOSIS — E1165 Type 2 diabetes mellitus with hyperglycemia: Secondary | ICD-10-CM | POA: Diagnosis not present

## 2023-09-30 DIAGNOSIS — I4581 Long QT syndrome: Secondary | ICD-10-CM | POA: Diagnosis not present

## 2023-09-30 DIAGNOSIS — I70234 Atherosclerosis of native arteries of right leg with ulceration of heel and midfoot: Secondary | ICD-10-CM | POA: Diagnosis not present

## 2023-09-30 DIAGNOSIS — L02415 Cutaneous abscess of right lower limb: Secondary | ICD-10-CM | POA: Diagnosis not present

## 2023-09-30 DIAGNOSIS — T84624D Infection and inflammatory reaction due to internal fixation device of right fibula, subsequent encounter: Secondary | ICD-10-CM | POA: Diagnosis not present

## 2023-09-30 DIAGNOSIS — I10 Essential (primary) hypertension: Secondary | ICD-10-CM | POA: Diagnosis not present

## 2023-09-30 DIAGNOSIS — E43 Unspecified severe protein-calorie malnutrition: Secondary | ICD-10-CM | POA: Diagnosis not present

## 2023-09-30 DIAGNOSIS — I480 Paroxysmal atrial fibrillation: Secondary | ICD-10-CM | POA: Diagnosis not present

## 2023-09-30 DIAGNOSIS — M86171 Other acute osteomyelitis, right ankle and foot: Secondary | ICD-10-CM | POA: Diagnosis not present

## 2023-10-01 DIAGNOSIS — N1832 Chronic kidney disease, stage 3b: Secondary | ICD-10-CM | POA: Diagnosis not present

## 2023-10-01 DIAGNOSIS — I480 Paroxysmal atrial fibrillation: Secondary | ICD-10-CM | POA: Diagnosis not present

## 2023-10-01 DIAGNOSIS — M86171 Other acute osteomyelitis, right ankle and foot: Secondary | ICD-10-CM | POA: Diagnosis not present

## 2023-10-01 DIAGNOSIS — I10 Essential (primary) hypertension: Secondary | ICD-10-CM | POA: Diagnosis not present

## 2023-10-01 DIAGNOSIS — I4581 Long QT syndrome: Secondary | ICD-10-CM | POA: Diagnosis not present

## 2023-10-01 DIAGNOSIS — E1140 Type 2 diabetes mellitus with diabetic neuropathy, unspecified: Secondary | ICD-10-CM | POA: Diagnosis not present

## 2023-10-01 DIAGNOSIS — E43 Unspecified severe protein-calorie malnutrition: Secondary | ICD-10-CM | POA: Diagnosis not present

## 2023-10-01 DIAGNOSIS — L97411 Non-pressure chronic ulcer of right heel and midfoot limited to breakdown of skin: Secondary | ICD-10-CM | POA: Diagnosis not present

## 2023-10-01 DIAGNOSIS — I70234 Atherosclerosis of native arteries of right leg with ulceration of heel and midfoot: Secondary | ICD-10-CM | POA: Diagnosis not present

## 2023-10-01 DIAGNOSIS — T84624D Infection and inflammatory reaction due to internal fixation device of right fibula, subsequent encounter: Secondary | ICD-10-CM | POA: Diagnosis not present

## 2023-10-01 DIAGNOSIS — M6281 Muscle weakness (generalized): Secondary | ICD-10-CM | POA: Diagnosis not present

## 2023-10-01 DIAGNOSIS — E1165 Type 2 diabetes mellitus with hyperglycemia: Secondary | ICD-10-CM | POA: Diagnosis not present

## 2023-10-02 DIAGNOSIS — I4581 Long QT syndrome: Secondary | ICD-10-CM | POA: Diagnosis not present

## 2023-10-02 DIAGNOSIS — I10 Essential (primary) hypertension: Secondary | ICD-10-CM | POA: Diagnosis not present

## 2023-10-02 DIAGNOSIS — L97411 Non-pressure chronic ulcer of right heel and midfoot limited to breakdown of skin: Secondary | ICD-10-CM | POA: Diagnosis not present

## 2023-10-02 DIAGNOSIS — I70234 Atherosclerosis of native arteries of right leg with ulceration of heel and midfoot: Secondary | ICD-10-CM | POA: Diagnosis not present

## 2023-10-02 DIAGNOSIS — M6281 Muscle weakness (generalized): Secondary | ICD-10-CM | POA: Diagnosis not present

## 2023-10-02 DIAGNOSIS — E43 Unspecified severe protein-calorie malnutrition: Secondary | ICD-10-CM | POA: Diagnosis not present

## 2023-10-02 DIAGNOSIS — I480 Paroxysmal atrial fibrillation: Secondary | ICD-10-CM | POA: Diagnosis not present

## 2023-10-02 DIAGNOSIS — T84624D Infection and inflammatory reaction due to internal fixation device of right fibula, subsequent encounter: Secondary | ICD-10-CM | POA: Diagnosis not present

## 2023-10-02 DIAGNOSIS — M86171 Other acute osteomyelitis, right ankle and foot: Secondary | ICD-10-CM | POA: Diagnosis not present

## 2023-10-02 DIAGNOSIS — E1165 Type 2 diabetes mellitus with hyperglycemia: Secondary | ICD-10-CM | POA: Diagnosis not present

## 2023-10-02 DIAGNOSIS — E1140 Type 2 diabetes mellitus with diabetic neuropathy, unspecified: Secondary | ICD-10-CM | POA: Diagnosis not present

## 2023-10-02 DIAGNOSIS — N1832 Chronic kidney disease, stage 3b: Secondary | ICD-10-CM | POA: Diagnosis not present

## 2023-10-03 DIAGNOSIS — L97411 Non-pressure chronic ulcer of right heel and midfoot limited to breakdown of skin: Secondary | ICD-10-CM | POA: Diagnosis not present

## 2023-10-03 DIAGNOSIS — I10 Essential (primary) hypertension: Secondary | ICD-10-CM | POA: Diagnosis not present

## 2023-10-03 DIAGNOSIS — M86171 Other acute osteomyelitis, right ankle and foot: Secondary | ICD-10-CM | POA: Diagnosis not present

## 2023-10-03 DIAGNOSIS — T84624D Infection and inflammatory reaction due to internal fixation device of right fibula, subsequent encounter: Secondary | ICD-10-CM | POA: Diagnosis not present

## 2023-10-03 DIAGNOSIS — I70234 Atherosclerosis of native arteries of right leg with ulceration of heel and midfoot: Secondary | ICD-10-CM | POA: Diagnosis not present

## 2023-10-03 DIAGNOSIS — E1165 Type 2 diabetes mellitus with hyperglycemia: Secondary | ICD-10-CM | POA: Diagnosis not present

## 2023-10-03 DIAGNOSIS — E43 Unspecified severe protein-calorie malnutrition: Secondary | ICD-10-CM | POA: Diagnosis not present

## 2023-10-03 DIAGNOSIS — N1832 Chronic kidney disease, stage 3b: Secondary | ICD-10-CM | POA: Diagnosis not present

## 2023-10-03 DIAGNOSIS — M6281 Muscle weakness (generalized): Secondary | ICD-10-CM | POA: Diagnosis not present

## 2023-10-03 DIAGNOSIS — I480 Paroxysmal atrial fibrillation: Secondary | ICD-10-CM | POA: Diagnosis not present

## 2023-10-03 DIAGNOSIS — I4581 Long QT syndrome: Secondary | ICD-10-CM | POA: Diagnosis not present

## 2023-10-03 DIAGNOSIS — E1140 Type 2 diabetes mellitus with diabetic neuropathy, unspecified: Secondary | ICD-10-CM | POA: Diagnosis not present

## 2023-10-04 DIAGNOSIS — M86171 Other acute osteomyelitis, right ankle and foot: Secondary | ICD-10-CM | POA: Diagnosis not present

## 2023-10-04 DIAGNOSIS — M6281 Muscle weakness (generalized): Secondary | ICD-10-CM | POA: Diagnosis not present

## 2023-10-04 DIAGNOSIS — I480 Paroxysmal atrial fibrillation: Secondary | ICD-10-CM | POA: Diagnosis not present

## 2023-10-04 DIAGNOSIS — E1165 Type 2 diabetes mellitus with hyperglycemia: Secondary | ICD-10-CM | POA: Diagnosis not present

## 2023-10-04 DIAGNOSIS — I70234 Atherosclerosis of native arteries of right leg with ulceration of heel and midfoot: Secondary | ICD-10-CM | POA: Diagnosis not present

## 2023-10-04 DIAGNOSIS — I4581 Long QT syndrome: Secondary | ICD-10-CM | POA: Diagnosis not present

## 2023-10-04 DIAGNOSIS — E43 Unspecified severe protein-calorie malnutrition: Secondary | ICD-10-CM | POA: Diagnosis not present

## 2023-10-04 DIAGNOSIS — N1832 Chronic kidney disease, stage 3b: Secondary | ICD-10-CM | POA: Diagnosis not present

## 2023-10-04 DIAGNOSIS — T84624D Infection and inflammatory reaction due to internal fixation device of right fibula, subsequent encounter: Secondary | ICD-10-CM | POA: Diagnosis not present

## 2023-10-04 DIAGNOSIS — E1140 Type 2 diabetes mellitus with diabetic neuropathy, unspecified: Secondary | ICD-10-CM | POA: Diagnosis not present

## 2023-10-04 DIAGNOSIS — L97411 Non-pressure chronic ulcer of right heel and midfoot limited to breakdown of skin: Secondary | ICD-10-CM | POA: Diagnosis not present

## 2023-10-04 DIAGNOSIS — I10 Essential (primary) hypertension: Secondary | ICD-10-CM | POA: Diagnosis not present

## 2023-10-05 DIAGNOSIS — I4581 Long QT syndrome: Secondary | ICD-10-CM | POA: Diagnosis not present

## 2023-10-05 DIAGNOSIS — I10 Essential (primary) hypertension: Secondary | ICD-10-CM | POA: Diagnosis not present

## 2023-10-05 DIAGNOSIS — I70234 Atherosclerosis of native arteries of right leg with ulceration of heel and midfoot: Secondary | ICD-10-CM | POA: Diagnosis not present

## 2023-10-05 DIAGNOSIS — I480 Paroxysmal atrial fibrillation: Secondary | ICD-10-CM | POA: Diagnosis not present

## 2023-10-05 DIAGNOSIS — M86171 Other acute osteomyelitis, right ankle and foot: Secondary | ICD-10-CM | POA: Diagnosis not present

## 2023-10-05 DIAGNOSIS — T84624D Infection and inflammatory reaction due to internal fixation device of right fibula, subsequent encounter: Secondary | ICD-10-CM | POA: Diagnosis not present

## 2023-10-05 DIAGNOSIS — E1140 Type 2 diabetes mellitus with diabetic neuropathy, unspecified: Secondary | ICD-10-CM | POA: Diagnosis not present

## 2023-10-05 DIAGNOSIS — N1832 Chronic kidney disease, stage 3b: Secondary | ICD-10-CM | POA: Diagnosis not present

## 2023-10-05 DIAGNOSIS — L97411 Non-pressure chronic ulcer of right heel and midfoot limited to breakdown of skin: Secondary | ICD-10-CM | POA: Diagnosis not present

## 2023-10-05 DIAGNOSIS — M6281 Muscle weakness (generalized): Secondary | ICD-10-CM | POA: Diagnosis not present

## 2023-10-05 DIAGNOSIS — E1165 Type 2 diabetes mellitus with hyperglycemia: Secondary | ICD-10-CM | POA: Diagnosis not present

## 2023-10-05 DIAGNOSIS — E43 Unspecified severe protein-calorie malnutrition: Secondary | ICD-10-CM | POA: Diagnosis not present

## 2023-10-06 DIAGNOSIS — E1165 Type 2 diabetes mellitus with hyperglycemia: Secondary | ICD-10-CM | POA: Diagnosis not present

## 2023-10-06 DIAGNOSIS — M86171 Other acute osteomyelitis, right ankle and foot: Secondary | ICD-10-CM | POA: Diagnosis not present

## 2023-10-06 DIAGNOSIS — T84624D Infection and inflammatory reaction due to internal fixation device of right fibula, subsequent encounter: Secondary | ICD-10-CM | POA: Diagnosis not present

## 2023-10-06 DIAGNOSIS — N1832 Chronic kidney disease, stage 3b: Secondary | ICD-10-CM | POA: Diagnosis not present

## 2023-10-06 DIAGNOSIS — I70234 Atherosclerosis of native arteries of right leg with ulceration of heel and midfoot: Secondary | ICD-10-CM | POA: Diagnosis not present

## 2023-10-06 DIAGNOSIS — E1140 Type 2 diabetes mellitus with diabetic neuropathy, unspecified: Secondary | ICD-10-CM | POA: Diagnosis not present

## 2023-10-06 DIAGNOSIS — I4581 Long QT syndrome: Secondary | ICD-10-CM | POA: Diagnosis not present

## 2023-10-06 DIAGNOSIS — L97411 Non-pressure chronic ulcer of right heel and midfoot limited to breakdown of skin: Secondary | ICD-10-CM | POA: Diagnosis not present

## 2023-10-06 DIAGNOSIS — I10 Essential (primary) hypertension: Secondary | ICD-10-CM | POA: Diagnosis not present

## 2023-10-06 DIAGNOSIS — E43 Unspecified severe protein-calorie malnutrition: Secondary | ICD-10-CM | POA: Diagnosis not present

## 2023-10-06 DIAGNOSIS — I480 Paroxysmal atrial fibrillation: Secondary | ICD-10-CM | POA: Diagnosis not present

## 2023-10-06 DIAGNOSIS — M6281 Muscle weakness (generalized): Secondary | ICD-10-CM | POA: Diagnosis not present

## 2023-10-07 DIAGNOSIS — I70234 Atherosclerosis of native arteries of right leg with ulceration of heel and midfoot: Secondary | ICD-10-CM | POA: Diagnosis not present

## 2023-10-07 DIAGNOSIS — L97411 Non-pressure chronic ulcer of right heel and midfoot limited to breakdown of skin: Secondary | ICD-10-CM | POA: Diagnosis not present

## 2023-10-07 DIAGNOSIS — M6281 Muscle weakness (generalized): Secondary | ICD-10-CM | POA: Diagnosis not present

## 2023-10-07 DIAGNOSIS — I480 Paroxysmal atrial fibrillation: Secondary | ICD-10-CM | POA: Diagnosis not present

## 2023-10-07 DIAGNOSIS — S82851E Displaced trimalleolar fracture of right lower leg, subsequent encounter for open fracture type I or II with routine healing: Secondary | ICD-10-CM | POA: Diagnosis not present

## 2023-10-07 DIAGNOSIS — T84418D Breakdown (mechanical) of other internal orthopedic devices, implants and grafts, subsequent encounter: Secondary | ICD-10-CM | POA: Diagnosis not present

## 2023-10-07 DIAGNOSIS — E1140 Type 2 diabetes mellitus with diabetic neuropathy, unspecified: Secondary | ICD-10-CM | POA: Diagnosis not present

## 2023-10-07 DIAGNOSIS — M86171 Other acute osteomyelitis, right ankle and foot: Secondary | ICD-10-CM | POA: Diagnosis not present

## 2023-10-07 DIAGNOSIS — E43 Unspecified severe protein-calorie malnutrition: Secondary | ICD-10-CM | POA: Diagnosis not present

## 2023-10-07 DIAGNOSIS — E1165 Type 2 diabetes mellitus with hyperglycemia: Secondary | ICD-10-CM | POA: Diagnosis not present

## 2023-10-07 DIAGNOSIS — T84624D Infection and inflammatory reaction due to internal fixation device of right fibula, subsequent encounter: Secondary | ICD-10-CM | POA: Diagnosis not present

## 2023-10-07 DIAGNOSIS — N1832 Chronic kidney disease, stage 3b: Secondary | ICD-10-CM | POA: Diagnosis not present

## 2023-10-07 DIAGNOSIS — I10 Essential (primary) hypertension: Secondary | ICD-10-CM | POA: Diagnosis not present

## 2023-10-07 DIAGNOSIS — I4581 Long QT syndrome: Secondary | ICD-10-CM | POA: Diagnosis not present

## 2023-10-08 ENCOUNTER — Ambulatory Visit: Payer: Self-pay | Admitting: Infectious Disease

## 2023-10-08 ENCOUNTER — Telehealth: Payer: Self-pay

## 2023-10-08 DIAGNOSIS — Z794 Long term (current) use of insulin: Secondary | ICD-10-CM | POA: Diagnosis not present

## 2023-10-08 DIAGNOSIS — I272 Pulmonary hypertension, unspecified: Secondary | ICD-10-CM | POA: Diagnosis not present

## 2023-10-08 DIAGNOSIS — E1122 Type 2 diabetes mellitus with diabetic chronic kidney disease: Secondary | ICD-10-CM | POA: Diagnosis not present

## 2023-10-08 DIAGNOSIS — M86171 Other acute osteomyelitis, right ankle and foot: Secondary | ICD-10-CM | POA: Diagnosis not present

## 2023-10-08 DIAGNOSIS — J449 Chronic obstructive pulmonary disease, unspecified: Secondary | ICD-10-CM | POA: Diagnosis not present

## 2023-10-08 DIAGNOSIS — I130 Hypertensive heart and chronic kidney disease with heart failure and stage 1 through stage 4 chronic kidney disease, or unspecified chronic kidney disease: Secondary | ICD-10-CM | POA: Diagnosis not present

## 2023-10-08 DIAGNOSIS — E785 Hyperlipidemia, unspecified: Secondary | ICD-10-CM | POA: Diagnosis not present

## 2023-10-08 DIAGNOSIS — D631 Anemia in chronic kidney disease: Secondary | ICD-10-CM | POA: Diagnosis not present

## 2023-10-08 DIAGNOSIS — E43 Unspecified severe protein-calorie malnutrition: Secondary | ICD-10-CM | POA: Diagnosis not present

## 2023-10-08 DIAGNOSIS — E1165 Type 2 diabetes mellitus with hyperglycemia: Secondary | ICD-10-CM | POA: Diagnosis not present

## 2023-10-08 DIAGNOSIS — E1140 Type 2 diabetes mellitus with diabetic neuropathy, unspecified: Secondary | ICD-10-CM | POA: Diagnosis not present

## 2023-10-08 DIAGNOSIS — N184 Chronic kidney disease, stage 4 (severe): Secondary | ICD-10-CM | POA: Diagnosis not present

## 2023-10-08 DIAGNOSIS — K219 Gastro-esophageal reflux disease without esophagitis: Secondary | ICD-10-CM | POA: Diagnosis not present

## 2023-10-08 DIAGNOSIS — M009 Pyogenic arthritis, unspecified: Secondary | ICD-10-CM | POA: Diagnosis not present

## 2023-10-08 DIAGNOSIS — I480 Paroxysmal atrial fibrillation: Secondary | ICD-10-CM | POA: Diagnosis not present

## 2023-10-08 DIAGNOSIS — E039 Hypothyroidism, unspecified: Secondary | ICD-10-CM | POA: Diagnosis not present

## 2023-10-08 DIAGNOSIS — T84624A Infection and inflammatory reaction due to internal fixation device of right fibula, initial encounter: Secondary | ICD-10-CM | POA: Diagnosis not present

## 2023-10-08 DIAGNOSIS — I872 Venous insufficiency (chronic) (peripheral): Secondary | ICD-10-CM | POA: Diagnosis not present

## 2023-10-08 DIAGNOSIS — E1121 Type 2 diabetes mellitus with diabetic nephropathy: Secondary | ICD-10-CM | POA: Diagnosis not present

## 2023-10-08 DIAGNOSIS — I5032 Chronic diastolic (congestive) heart failure: Secondary | ICD-10-CM | POA: Diagnosis not present

## 2023-10-08 DIAGNOSIS — E1151 Type 2 diabetes mellitus with diabetic peripheral angiopathy without gangrene: Secondary | ICD-10-CM | POA: Diagnosis not present

## 2023-10-08 DIAGNOSIS — I70234 Atherosclerosis of native arteries of right leg with ulceration of heel and midfoot: Secondary | ICD-10-CM | POA: Diagnosis not present

## 2023-10-08 DIAGNOSIS — E1169 Type 2 diabetes mellitus with other specified complication: Secondary | ICD-10-CM | POA: Diagnosis not present

## 2023-10-08 DIAGNOSIS — I2510 Atherosclerotic heart disease of native coronary artery without angina pectoris: Secondary | ICD-10-CM | POA: Diagnosis not present

## 2023-10-08 NOTE — Telephone Encounter (Signed)
 Called [facility] and spoke with Neicy who stated that the patient was discharged yesterday and has no idea if patient is home or at another facility. Called patient spouse and left voice message to contact our office.  Patient eot for picc is 9/12.

## 2023-10-10 DIAGNOSIS — M86171 Other acute osteomyelitis, right ankle and foot: Secondary | ICD-10-CM | POA: Diagnosis not present

## 2023-10-10 DIAGNOSIS — E119 Type 2 diabetes mellitus without complications: Secondary | ICD-10-CM | POA: Diagnosis not present

## 2023-10-10 DIAGNOSIS — E559 Vitamin D deficiency, unspecified: Secondary | ICD-10-CM | POA: Diagnosis not present

## 2023-10-11 ENCOUNTER — Other Ambulatory Visit: Payer: Self-pay | Admitting: Cardiovascular Disease

## 2023-10-12 DIAGNOSIS — M009 Pyogenic arthritis, unspecified: Secondary | ICD-10-CM | POA: Diagnosis not present

## 2023-10-12 DIAGNOSIS — E1121 Type 2 diabetes mellitus with diabetic nephropathy: Secondary | ICD-10-CM | POA: Diagnosis not present

## 2023-10-12 DIAGNOSIS — Z794 Long term (current) use of insulin: Secondary | ICD-10-CM | POA: Diagnosis not present

## 2023-10-13 DIAGNOSIS — K219 Gastro-esophageal reflux disease without esophagitis: Secondary | ICD-10-CM | POA: Diagnosis not present

## 2023-10-13 DIAGNOSIS — I2510 Atherosclerotic heart disease of native coronary artery without angina pectoris: Secondary | ICD-10-CM | POA: Diagnosis not present

## 2023-10-13 DIAGNOSIS — E1165 Type 2 diabetes mellitus with hyperglycemia: Secondary | ICD-10-CM | POA: Diagnosis not present

## 2023-10-13 DIAGNOSIS — E1151 Type 2 diabetes mellitus with diabetic peripheral angiopathy without gangrene: Secondary | ICD-10-CM | POA: Diagnosis not present

## 2023-10-13 DIAGNOSIS — D631 Anemia in chronic kidney disease: Secondary | ICD-10-CM | POA: Diagnosis not present

## 2023-10-13 DIAGNOSIS — I872 Venous insufficiency (chronic) (peripheral): Secondary | ICD-10-CM | POA: Diagnosis not present

## 2023-10-13 DIAGNOSIS — I480 Paroxysmal atrial fibrillation: Secondary | ICD-10-CM | POA: Diagnosis not present

## 2023-10-13 DIAGNOSIS — E1140 Type 2 diabetes mellitus with diabetic neuropathy, unspecified: Secondary | ICD-10-CM | POA: Diagnosis not present

## 2023-10-13 DIAGNOSIS — E1169 Type 2 diabetes mellitus with other specified complication: Secondary | ICD-10-CM | POA: Diagnosis not present

## 2023-10-13 DIAGNOSIS — I70234 Atherosclerosis of native arteries of right leg with ulceration of heel and midfoot: Secondary | ICD-10-CM | POA: Diagnosis not present

## 2023-10-13 DIAGNOSIS — N184 Chronic kidney disease, stage 4 (severe): Secondary | ICD-10-CM | POA: Diagnosis not present

## 2023-10-13 DIAGNOSIS — M86171 Other acute osteomyelitis, right ankle and foot: Secondary | ICD-10-CM | POA: Diagnosis not present

## 2023-10-13 DIAGNOSIS — T84624A Infection and inflammatory reaction due to internal fixation device of right fibula, initial encounter: Secondary | ICD-10-CM | POA: Diagnosis not present

## 2023-10-13 DIAGNOSIS — E039 Hypothyroidism, unspecified: Secondary | ICD-10-CM | POA: Diagnosis not present

## 2023-10-13 DIAGNOSIS — I272 Pulmonary hypertension, unspecified: Secondary | ICD-10-CM | POA: Diagnosis not present

## 2023-10-13 DIAGNOSIS — K6812 Psoas muscle abscess: Secondary | ICD-10-CM | POA: Diagnosis not present

## 2023-10-13 DIAGNOSIS — E43 Unspecified severe protein-calorie malnutrition: Secondary | ICD-10-CM | POA: Diagnosis not present

## 2023-10-13 DIAGNOSIS — E1122 Type 2 diabetes mellitus with diabetic chronic kidney disease: Secondary | ICD-10-CM | POA: Diagnosis not present

## 2023-10-13 DIAGNOSIS — Z794 Long term (current) use of insulin: Secondary | ICD-10-CM | POA: Diagnosis not present

## 2023-10-13 DIAGNOSIS — I5032 Chronic diastolic (congestive) heart failure: Secondary | ICD-10-CM | POA: Diagnosis not present

## 2023-10-13 DIAGNOSIS — E785 Hyperlipidemia, unspecified: Secondary | ICD-10-CM | POA: Diagnosis not present

## 2023-10-13 DIAGNOSIS — Z792 Long term (current) use of antibiotics: Secondary | ICD-10-CM | POA: Diagnosis not present

## 2023-10-13 DIAGNOSIS — I130 Hypertensive heart and chronic kidney disease with heart failure and stage 1 through stage 4 chronic kidney disease, or unspecified chronic kidney disease: Secondary | ICD-10-CM | POA: Diagnosis not present

## 2023-10-13 DIAGNOSIS — J449 Chronic obstructive pulmonary disease, unspecified: Secondary | ICD-10-CM | POA: Diagnosis not present

## 2023-10-14 ENCOUNTER — Ambulatory Visit: Admitting: Cardiovascular Disease

## 2023-10-14 ENCOUNTER — Telehealth: Payer: Self-pay

## 2023-10-14 DIAGNOSIS — M869 Osteomyelitis, unspecified: Secondary | ICD-10-CM

## 2023-10-14 NOTE — Telephone Encounter (Signed)
 Patient partner called to inform provider that patient is not tolerating antibiotics well. Is currently on Daptomycin  and Ertapenem . Pt is c/o fatigue and and lack of appetite. Has noticed changes in taste and is now refusing to eat food. Is asking about Marinol to help with appetite. Denies fever/chills. HH has stated vitals are stable. Has not had PT evaluation since d/c home due to lack of mobility.  Pt is not able to move freely and stays on recliner; is working on getting hospital bed at home.  Also noted that pt missed 9 doses of antbx while at SNF. Partner is asking if antibiotic extension is needed.  Also asking if antibiotics can be switched due to concerns for intolerance.  Pt declined recommendation to go to ED for eval. Is not able to come in person to appt due to mobility issues. Is able to do virtual visit if needed. Lorenda CHRISTELLA Code, RMA

## 2023-10-15 MED ORDER — DOXYCYCLINE HYCLATE 100 MG PO TABS: 100.0000 mg | ORAL_TABLET | Freq: Two times a day (BID) | ORAL | 5 refills | Status: DC

## 2023-10-15 MED ORDER — LEVOFLOXACIN 750 MG PO TABS: 750.0000 mg | ORAL_TABLET | Freq: Every day | ORAL | 5 refills | Status: DC

## 2023-10-15 NOTE — Telephone Encounter (Signed)
 He has had doxy in the past but looks like a new allergy was listed back in May of hives/rash. Do you want to trial it anyway?

## 2023-10-15 NOTE — Telephone Encounter (Signed)
 Sorry Elma Breed.. meant to send to the other [facility]!

## 2023-10-15 NOTE — Telephone Encounter (Signed)
 Looking into this today and will get back to you all!

## 2023-10-15 NOTE — Addendum Note (Signed)
 Addended by: Emillie Chasen L on: 10/15/2023 02:45 PM   Modules accepted: Orders

## 2023-10-15 NOTE — Telephone Encounter (Signed)
 Thank you :)

## 2023-10-15 NOTE — Telephone Encounter (Signed)
 Sent Rxs to [facility] in [place].  Mahari Vankirk L. Hudson Majkowski, PharmD, BCIDP, AAHIVP, CPP Infectious Diseases Clinical Pharmacist Practitioner Clinical Pharmacist Lead, Specialty Pharmacy [facility]

## 2023-10-15 NOTE — Telephone Encounter (Signed)
 Hey Cece -  agree with Dr. Overton on switching to levofloxacin  and doxycycline . No drug interactions identified.   Levofloxacin  750 mg PO daily  Doxycycline  100 mg PO BID  Let me know if you need us  to do anything else!

## 2023-10-15 NOTE — Telephone Encounter (Signed)
 Per Dr. Overton okay to pull picc since switching pt to oral antbx. Pull picc orders forwarded to [facility]. Zachary (spouse) is aware of medication change. Would like oral [facility] [place].  No questions at this time.  Lorenda CHRISTELLA Code, RMA

## 2023-10-16 DIAGNOSIS — I480 Paroxysmal atrial fibrillation: Secondary | ICD-10-CM | POA: Diagnosis not present

## 2023-10-16 DIAGNOSIS — Z794 Long term (current) use of insulin: Secondary | ICD-10-CM | POA: Diagnosis not present

## 2023-10-16 DIAGNOSIS — I872 Venous insufficiency (chronic) (peripheral): Secondary | ICD-10-CM | POA: Diagnosis not present

## 2023-10-16 DIAGNOSIS — E785 Hyperlipidemia, unspecified: Secondary | ICD-10-CM | POA: Diagnosis not present

## 2023-10-16 DIAGNOSIS — E1151 Type 2 diabetes mellitus with diabetic peripheral angiopathy without gangrene: Secondary | ICD-10-CM | POA: Diagnosis not present

## 2023-10-16 DIAGNOSIS — I272 Pulmonary hypertension, unspecified: Secondary | ICD-10-CM | POA: Diagnosis not present

## 2023-10-16 DIAGNOSIS — J449 Chronic obstructive pulmonary disease, unspecified: Secondary | ICD-10-CM | POA: Diagnosis not present

## 2023-10-16 DIAGNOSIS — E039 Hypothyroidism, unspecified: Secondary | ICD-10-CM | POA: Diagnosis not present

## 2023-10-16 DIAGNOSIS — K219 Gastro-esophageal reflux disease without esophagitis: Secondary | ICD-10-CM | POA: Diagnosis not present

## 2023-10-16 DIAGNOSIS — E1165 Type 2 diabetes mellitus with hyperglycemia: Secondary | ICD-10-CM | POA: Diagnosis not present

## 2023-10-16 DIAGNOSIS — E1169 Type 2 diabetes mellitus with other specified complication: Secondary | ICD-10-CM | POA: Diagnosis not present

## 2023-10-16 DIAGNOSIS — M86171 Other acute osteomyelitis, right ankle and foot: Secondary | ICD-10-CM | POA: Diagnosis not present

## 2023-10-16 DIAGNOSIS — E1122 Type 2 diabetes mellitus with diabetic chronic kidney disease: Secondary | ICD-10-CM | POA: Diagnosis not present

## 2023-10-16 DIAGNOSIS — E43 Unspecified severe protein-calorie malnutrition: Secondary | ICD-10-CM | POA: Diagnosis not present

## 2023-10-16 DIAGNOSIS — I5032 Chronic diastolic (congestive) heart failure: Secondary | ICD-10-CM | POA: Diagnosis not present

## 2023-10-16 DIAGNOSIS — E1140 Type 2 diabetes mellitus with diabetic neuropathy, unspecified: Secondary | ICD-10-CM | POA: Diagnosis not present

## 2023-10-16 DIAGNOSIS — T84624A Infection and inflammatory reaction due to internal fixation device of right fibula, initial encounter: Secondary | ICD-10-CM | POA: Diagnosis not present

## 2023-10-16 DIAGNOSIS — D631 Anemia in chronic kidney disease: Secondary | ICD-10-CM | POA: Diagnosis not present

## 2023-10-16 DIAGNOSIS — I2510 Atherosclerotic heart disease of native coronary artery without angina pectoris: Secondary | ICD-10-CM | POA: Diagnosis not present

## 2023-10-16 DIAGNOSIS — N184 Chronic kidney disease, stage 4 (severe): Secondary | ICD-10-CM | POA: Diagnosis not present

## 2023-10-16 DIAGNOSIS — I130 Hypertensive heart and chronic kidney disease with heart failure and stage 1 through stage 4 chronic kidney disease, or unspecified chronic kidney disease: Secondary | ICD-10-CM | POA: Diagnosis not present

## 2023-10-16 DIAGNOSIS — I70234 Atherosclerosis of native arteries of right leg with ulceration of heel and midfoot: Secondary | ICD-10-CM | POA: Diagnosis not present

## 2023-10-17 DIAGNOSIS — J449 Chronic obstructive pulmonary disease, unspecified: Secondary | ICD-10-CM | POA: Diagnosis not present

## 2023-10-17 DIAGNOSIS — E1165 Type 2 diabetes mellitus with hyperglycemia: Secondary | ICD-10-CM | POA: Diagnosis not present

## 2023-10-17 DIAGNOSIS — D649 Anemia, unspecified: Secondary | ICD-10-CM | POA: Diagnosis not present

## 2023-10-17 DIAGNOSIS — I10 Essential (primary) hypertension: Secondary | ICD-10-CM | POA: Diagnosis not present

## 2023-10-17 DIAGNOSIS — E1140 Type 2 diabetes mellitus with diabetic neuropathy, unspecified: Secondary | ICD-10-CM | POA: Diagnosis not present

## 2023-10-17 DIAGNOSIS — G8929 Other chronic pain: Secondary | ICD-10-CM | POA: Diagnosis not present

## 2023-10-17 DIAGNOSIS — I2510 Atherosclerotic heart disease of native coronary artery without angina pectoris: Secondary | ICD-10-CM | POA: Diagnosis not present

## 2023-10-17 DIAGNOSIS — Z0000 Encounter for general adult medical examination without abnormal findings: Secondary | ICD-10-CM | POA: Diagnosis not present

## 2023-10-17 DIAGNOSIS — I70213 Atherosclerosis of native arteries of extremities with intermittent claudication, bilateral legs: Secondary | ICD-10-CM | POA: Diagnosis not present

## 2023-10-17 DIAGNOSIS — I5032 Chronic diastolic (congestive) heart failure: Secondary | ICD-10-CM | POA: Diagnosis not present

## 2023-10-18 DIAGNOSIS — S82891D Other fracture of right lower leg, subsequent encounter for closed fracture with routine healing: Secondary | ICD-10-CM | POA: Diagnosis not present

## 2023-10-18 DIAGNOSIS — S82851E Displaced trimalleolar fracture of right lower leg, subsequent encounter for open fracture type I or II with routine healing: Secondary | ICD-10-CM | POA: Diagnosis not present

## 2023-10-23 DIAGNOSIS — E1169 Type 2 diabetes mellitus with other specified complication: Secondary | ICD-10-CM | POA: Diagnosis not present

## 2023-10-23 DIAGNOSIS — I480 Paroxysmal atrial fibrillation: Secondary | ICD-10-CM | POA: Diagnosis not present

## 2023-10-23 DIAGNOSIS — E1122 Type 2 diabetes mellitus with diabetic chronic kidney disease: Secondary | ICD-10-CM | POA: Diagnosis not present

## 2023-10-23 DIAGNOSIS — I70234 Atherosclerosis of native arteries of right leg with ulceration of heel and midfoot: Secondary | ICD-10-CM | POA: Diagnosis not present

## 2023-10-23 DIAGNOSIS — N184 Chronic kidney disease, stage 4 (severe): Secondary | ICD-10-CM | POA: Diagnosis not present

## 2023-10-23 DIAGNOSIS — K219 Gastro-esophageal reflux disease without esophagitis: Secondary | ICD-10-CM | POA: Diagnosis not present

## 2023-10-23 DIAGNOSIS — J449 Chronic obstructive pulmonary disease, unspecified: Secondary | ICD-10-CM | POA: Diagnosis not present

## 2023-10-23 DIAGNOSIS — E1140 Type 2 diabetes mellitus with diabetic neuropathy, unspecified: Secondary | ICD-10-CM | POA: Diagnosis not present

## 2023-10-23 DIAGNOSIS — I5032 Chronic diastolic (congestive) heart failure: Secondary | ICD-10-CM | POA: Diagnosis not present

## 2023-10-23 DIAGNOSIS — E43 Unspecified severe protein-calorie malnutrition: Secondary | ICD-10-CM | POA: Diagnosis not present

## 2023-10-23 DIAGNOSIS — I872 Venous insufficiency (chronic) (peripheral): Secondary | ICD-10-CM | POA: Diagnosis not present

## 2023-10-23 DIAGNOSIS — E1165 Type 2 diabetes mellitus with hyperglycemia: Secondary | ICD-10-CM | POA: Diagnosis not present

## 2023-10-23 DIAGNOSIS — E1151 Type 2 diabetes mellitus with diabetic peripheral angiopathy without gangrene: Secondary | ICD-10-CM | POA: Diagnosis not present

## 2023-10-23 DIAGNOSIS — I2510 Atherosclerotic heart disease of native coronary artery without angina pectoris: Secondary | ICD-10-CM | POA: Diagnosis not present

## 2023-10-23 DIAGNOSIS — T84624A Infection and inflammatory reaction due to internal fixation device of right fibula, initial encounter: Secondary | ICD-10-CM | POA: Diagnosis not present

## 2023-10-23 DIAGNOSIS — Z794 Long term (current) use of insulin: Secondary | ICD-10-CM | POA: Diagnosis not present

## 2023-10-23 DIAGNOSIS — D631 Anemia in chronic kidney disease: Secondary | ICD-10-CM | POA: Diagnosis not present

## 2023-10-23 DIAGNOSIS — E039 Hypothyroidism, unspecified: Secondary | ICD-10-CM | POA: Diagnosis not present

## 2023-10-23 DIAGNOSIS — I130 Hypertensive heart and chronic kidney disease with heart failure and stage 1 through stage 4 chronic kidney disease, or unspecified chronic kidney disease: Secondary | ICD-10-CM | POA: Diagnosis not present

## 2023-10-23 DIAGNOSIS — E785 Hyperlipidemia, unspecified: Secondary | ICD-10-CM | POA: Diagnosis not present

## 2023-10-23 DIAGNOSIS — M86171 Other acute osteomyelitis, right ankle and foot: Secondary | ICD-10-CM | POA: Diagnosis not present

## 2023-10-23 DIAGNOSIS — I272 Pulmonary hypertension, unspecified: Secondary | ICD-10-CM | POA: Diagnosis not present

## 2023-10-27 ENCOUNTER — Ambulatory Visit

## 2023-10-29 DIAGNOSIS — I2510 Atherosclerotic heart disease of native coronary artery without angina pectoris: Secondary | ICD-10-CM | POA: Diagnosis not present

## 2023-10-29 DIAGNOSIS — E1151 Type 2 diabetes mellitus with diabetic peripheral angiopathy without gangrene: Secondary | ICD-10-CM | POA: Diagnosis not present

## 2023-10-29 DIAGNOSIS — E039 Hypothyroidism, unspecified: Secondary | ICD-10-CM | POA: Diagnosis not present

## 2023-10-29 DIAGNOSIS — Z794 Long term (current) use of insulin: Secondary | ICD-10-CM | POA: Diagnosis not present

## 2023-10-29 DIAGNOSIS — I70234 Atherosclerosis of native arteries of right leg with ulceration of heel and midfoot: Secondary | ICD-10-CM | POA: Diagnosis not present

## 2023-10-29 DIAGNOSIS — I480 Paroxysmal atrial fibrillation: Secondary | ICD-10-CM | POA: Diagnosis not present

## 2023-10-29 DIAGNOSIS — E1140 Type 2 diabetes mellitus with diabetic neuropathy, unspecified: Secondary | ICD-10-CM | POA: Diagnosis not present

## 2023-10-29 DIAGNOSIS — M86171 Other acute osteomyelitis, right ankle and foot: Secondary | ICD-10-CM | POA: Diagnosis not present

## 2023-10-29 DIAGNOSIS — J449 Chronic obstructive pulmonary disease, unspecified: Secondary | ICD-10-CM | POA: Diagnosis not present

## 2023-10-29 DIAGNOSIS — E1165 Type 2 diabetes mellitus with hyperglycemia: Secondary | ICD-10-CM | POA: Diagnosis not present

## 2023-10-29 DIAGNOSIS — E1122 Type 2 diabetes mellitus with diabetic chronic kidney disease: Secondary | ICD-10-CM | POA: Diagnosis not present

## 2023-10-29 DIAGNOSIS — I130 Hypertensive heart and chronic kidney disease with heart failure and stage 1 through stage 4 chronic kidney disease, or unspecified chronic kidney disease: Secondary | ICD-10-CM | POA: Diagnosis not present

## 2023-10-29 DIAGNOSIS — N184 Chronic kidney disease, stage 4 (severe): Secondary | ICD-10-CM | POA: Diagnosis not present

## 2023-10-31 DIAGNOSIS — I130 Hypertensive heart and chronic kidney disease with heart failure and stage 1 through stage 4 chronic kidney disease, or unspecified chronic kidney disease: Secondary | ICD-10-CM | POA: Diagnosis not present

## 2023-10-31 DIAGNOSIS — E039 Hypothyroidism, unspecified: Secondary | ICD-10-CM | POA: Diagnosis not present

## 2023-10-31 DIAGNOSIS — T84624A Infection and inflammatory reaction due to internal fixation device of right fibula, initial encounter: Secondary | ICD-10-CM | POA: Diagnosis not present

## 2023-10-31 DIAGNOSIS — I480 Paroxysmal atrial fibrillation: Secondary | ICD-10-CM | POA: Diagnosis not present

## 2023-10-31 DIAGNOSIS — Z794 Long term (current) use of insulin: Secondary | ICD-10-CM | POA: Diagnosis not present

## 2023-10-31 DIAGNOSIS — E43 Unspecified severe protein-calorie malnutrition: Secondary | ICD-10-CM | POA: Diagnosis not present

## 2023-10-31 DIAGNOSIS — E1165 Type 2 diabetes mellitus with hyperglycemia: Secondary | ICD-10-CM | POA: Diagnosis not present

## 2023-10-31 DIAGNOSIS — I5032 Chronic diastolic (congestive) heart failure: Secondary | ICD-10-CM | POA: Diagnosis not present

## 2023-10-31 DIAGNOSIS — M86171 Other acute osteomyelitis, right ankle and foot: Secondary | ICD-10-CM | POA: Diagnosis not present

## 2023-10-31 DIAGNOSIS — J449 Chronic obstructive pulmonary disease, unspecified: Secondary | ICD-10-CM | POA: Diagnosis not present

## 2023-10-31 DIAGNOSIS — I272 Pulmonary hypertension, unspecified: Secondary | ICD-10-CM | POA: Diagnosis not present

## 2023-10-31 DIAGNOSIS — I70234 Atherosclerosis of native arteries of right leg with ulceration of heel and midfoot: Secondary | ICD-10-CM | POA: Diagnosis not present

## 2023-10-31 DIAGNOSIS — E1169 Type 2 diabetes mellitus with other specified complication: Secondary | ICD-10-CM | POA: Diagnosis not present

## 2023-10-31 DIAGNOSIS — D631 Anemia in chronic kidney disease: Secondary | ICD-10-CM | POA: Diagnosis not present

## 2023-10-31 DIAGNOSIS — E1140 Type 2 diabetes mellitus with diabetic neuropathy, unspecified: Secondary | ICD-10-CM | POA: Diagnosis not present

## 2023-10-31 DIAGNOSIS — K219 Gastro-esophageal reflux disease without esophagitis: Secondary | ICD-10-CM | POA: Diagnosis not present

## 2023-10-31 DIAGNOSIS — I2510 Atherosclerotic heart disease of native coronary artery without angina pectoris: Secondary | ICD-10-CM | POA: Diagnosis not present

## 2023-10-31 DIAGNOSIS — N184 Chronic kidney disease, stage 4 (severe): Secondary | ICD-10-CM | POA: Diagnosis not present

## 2023-10-31 DIAGNOSIS — E1122 Type 2 diabetes mellitus with diabetic chronic kidney disease: Secondary | ICD-10-CM | POA: Diagnosis not present

## 2023-10-31 DIAGNOSIS — E785 Hyperlipidemia, unspecified: Secondary | ICD-10-CM | POA: Diagnosis not present

## 2023-10-31 DIAGNOSIS — I872 Venous insufficiency (chronic) (peripheral): Secondary | ICD-10-CM | POA: Diagnosis not present

## 2023-10-31 DIAGNOSIS — E1151 Type 2 diabetes mellitus with diabetic peripheral angiopathy without gangrene: Secondary | ICD-10-CM | POA: Diagnosis not present

## 2023-11-03 ENCOUNTER — Ambulatory Visit

## 2023-11-03 ENCOUNTER — Encounter: Payer: Self-pay | Admitting: Infectious Disease

## 2023-11-03 DIAGNOSIS — M86179 Other acute osteomyelitis, unspecified ankle and foot: Secondary | ICD-10-CM | POA: Insufficient documentation

## 2023-11-03 DIAGNOSIS — M869 Osteomyelitis, unspecified: Secondary | ICD-10-CM | POA: Insufficient documentation

## 2023-11-03 NOTE — Progress Notes (Unsigned)
 Subjective:  Chief Complaint: chronic osteomyelitis ankle involving tibia, calcaneous and fibula   Patient ID: Harold [name], male    DOB: 1956/01/04, 68 y.o.   MRN: [id number]  HPI  Past Medical History:  Diagnosis Date   Anemia    Anginal pain (HCC) 04/2013   Cardiac cath showed patent stents with distal LAD, circumflex-OM and RCA disease in small vessels.   Anxiety    Atrial fibrillation (HCC) 12/2021   CAD S/P percutaneous coronary angioplasty 2003, 04/2012   status post PCI to LAD, circumflex-OM 2, RCA   Carotid artery occlusion    Chronic renal insufficiency, stage II (mild)    Chronic venous insufficiency    varicosities, no reflux; dopplers 04/14/12- valvular insufficiency in the R and L GSV   Complication of anesthesia    COPD, mild (HCC)    Depression    situaltional    Diabetes mellitus    Diabetic neuropathy (HCC) 08/24/2019   Dyslipidemia associated with type 2 diabetes mellitus (HCC)    Fall at home, initial encounter 01/02/2023   GERD (gastroesophageal reflux disease)    History of cardioversion 12/2021   at [facility]   HTN (hypertension)    Hypothyroidism    Neuromuscular disorder (HCC)    neuropathy in feet   Neuropathy    notably improved following PCI with improved cardiac function   Obesity    Obesity, Class II, BMI 35.0-39.9, with comorbidity (see actual BMI)    BMI 39; wgt loss efforts in place; seeing Dietician   Open ankle fracture 01/02/2023   Orthostatic hypotension    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   PONV (postoperative nausea and vomiting)    [employer] [place]   Pulmonary hypertension (HCC) 04/2012   PA pressure   Sleep apnea    uses nightly   Vitamin D  deficiency    per facility notes    Past Surgical History:  Procedure Laterality Date   ABDOMINAL AORTAGRAM N/A 11/15/2011   Procedure: ABDOMINAL EZELLA;  Surgeon: Carlin FORBES Haddock, MD;  Location: [facility] CATH LAB;  Service: Cardiovascular;  [name];    ABDOMINAL AORTOGRAM W/LOWER EXTREMITY N/A 10/13/2019   Procedure: ABDOMINAL AORTOGRAM W/LOWER EXTREMITY;  Surgeon: Darron Deatrice LABOR, MD;  Location: [facility] INVASIVE CV LAB;  Service: CV: Non-obst Ao-Iliac. R SFA-PopA patent with significant calcific stenosis of TP trunk-prox PTA (dominant) & CTO ATA  -> R PTA-TP PTCA   ABDOMINAL AORTOGRAM W/LOWER EXTREMITY N/A 11/06/2022   Procedure: ABDOMINAL AORTOGRAM W/LOWER EXTREMITY;  Surgeon: Darron Deatrice LABOR, MD;  Location: [facility] INVASIVE CV LAB;  Service: Cardiovascular;  [name];   ABDOMINAL AORTOGRAM W/LOWER EXTREMITY Right 04/01/2023   Procedure: ABDOMINAL AORTOGRAM W/LOWER EXTREMITY;  Surgeon: Serene Gaile ORN, MD;  Location: [facility] INVASIVE CV LAB;  Service: Cardiovascular;  Laterality: Right;   ABIs  04/27/2012   mild bilateral arterial insufficiency   ANKLE FUSION Right 05/15/2023   Procedure: ARTHRODESIS ANKLE WITH HINDFOOT FUSION NAIL;  Surgeon: Kendal Franky SQUIBB, MD;  [name];  Service: Orthopedics;  Laterality: Right;   APPLICATION OF WOUND VAC Right 03/31/2023   Procedure: APPLICATION OF WOUND VAC;  Surgeon: Kendal Franky SQUIBB, MD;  [name];  Service: Orthopedics;  Laterality: Right;   BACK SURGERY  2005 x1   X2-2010   BUNIONECTOMY Right 11/11/2013   Procedure: RIGHT FOOT SILVER BUNIONECTOMY;  Surgeon: Norleen Armor, MD;  Location: [facility];  Service: Orthopedics;  Laterality: Right;   CARDIAC CATHETERIZATION  12/11/2001  significant 3V CAD, normal LV function   CARDIOVERSION N/A 03/13/2022   Procedure: CARDIOVERSION;  Surgeon: Kate Lonni CROME, MD;  Location: [facility] ENDOSCOPY;  Service: Cardiovascular;  [name];   CARDIOVERSION N/A 07/25/2022   Procedure: CARDIOVERSION;  Surgeon: Jeffrie Oneil BROCKS, MD;  Location: [facility]VAS[facility] CV LAB;  Service: Cardiovascular;  [name];   Carotid Doppler  04/27/2012   right internal carotid: Elevated velocities but no evidence of plaque. Left internal carotid 40-59%    CAROTID ENDARTERECTOMY  2005   Right; recent carotid Dopplers notes elevated velocities.    colonscopy     CORONARY ANGIOPLASTY  12/21/2001   PTCA of the distal and mid AV groove circ, unsuccessful PTCA of second OM total occlusion, unsuccessful PTCA of the apical LAD total occlusion   CORONARY ANGIOPLASTY WITH STENT PLACEMENT  04/29/2012   PCI to 3 RCA lesions, Promus Premiere 2.2mmx8mm distally, mid was 2.17mx28mm and proximally 2.75x5mm, EF 55-60%   CORONARY STENT PLACEMENT  04/28/2012   PCI to LAD (3x107mm Xience DES postdilated to 3.25) and circ prox and mid (2 overlappinmg 2.44mmx12mmXience DES postdilated to 2.26mm)   DOPPLER ECHOCARDIOGRAPHY  04/28/2012   poor quality study: EF estimated 60-65%; unable to assess diastolic function (previously noted to have diastolic dysfunction); severely dilated left atrium and mild right atrium; dilated IVC consistent with increased central venous pressure.SABRA   EXTERNAL FIXATION LEG Right 05/12/2023   Procedure: EXTERNAL FIXATION, LOWER EXTREMITY;  Surgeon: Kendal Franky SQUIBB, MD;  [name];  Service: Orthopedics;  Laterality: Right;   HARDWARE REMOVAL Right 03/31/2023   Procedure: HARDWARE REMOVAL RIGHT ANKLE;  Surgeon: Kendal Franky SQUIBB, MD;  [name];  Service: Orthopedics;  Laterality: Right;   HARDWARE REMOVAL Right 01/28/2023   Procedure: REMOVAL OF HARDWARE RIGHT ANKLE;  [name], MD;  [name];  Service: Orthopedics;  Laterality: Right;   HARDWARE REMOVAL Right 06/21/2023   Procedure: REMOVAL, HARDWARE;  Surgeon: Kendal Franky SQUIBB, MD;  [name];  Service: Orthopedics;  Laterality: Right;   HERNIA REPAIR     I & D EXTREMITY Right 01/02/2023   Procedure: IRRIGATION AND DEBRIDEMENT RIGHT ANKLE;  [name], MD;  [name];  Service: Orthopedics;  Laterality: Right;   I & D EXTREMITY Right 04/02/2023   Procedure: DEBRIDEMENT RIGHT ANKLE;  Surgeon: Harden Jerona GAILS, MD;  Location: [facility] OR;  Service:  Orthopedics;  Laterality: Right;   INCISION AND DRAINAGE OF WOUND Right 09/19/2023   Procedure: IRRIGATION AND DEBRIDEMENT WOUND;  Surgeon: Kendal Franky SQUIBB, MD;  [name];  Service: Orthopedics;  Laterality: Right;   IR FLUORO GUIDE CV LINE LEFT  04/22/2023   IR FLUORO GUIDE CV LINE RIGHT  04/07/2023   IR PATIENT EVAL TECH 0-60 MINS  04/07/2023   IR RADIOLOGIST EVAL & MGMT  04/08/2023   IR REMOVAL TUN CV CATH W/O FL  05/05/2023   IR US  GUIDE VASC ACCESS LEFT  04/22/2023   IR US  GUIDE VASC ACCESS RIGHT  04/07/2023   LEFT AND RIGHT HEART CATHETERIZATION WITH CORONARY ANGIOGRAM N/A 04/27/2012   Procedure: LEFT AND RIGHT HEART CATHETERIZATION WITH CORONARY ANGIOGRAM;  [name], MD;  Location: [facility] CATH LAB;  Service: Cardiovascular;  [name];   LEFT AND RIGHT HEART CATHETERIZATION WITH CORONARY ANGIOGRAM N/A 05/14/2013   Procedure: LEFT AND RIGHT HEART CATHETERIZATION WITH CORONARY ANGIOGRAM;  [name], MD;  [name];  PCWP ;; [name], [name], Patent RCA ostial mid and distal stents - 70% distal runoff Dz   LUMBAR LAMINECTOMY/DECOMPRESSION MICRODISCECTOMY  01/07/2012   Procedure: LUMBAR LAMINECTOMY/DECOMPRESSION MICRODISCECTOMY 1 LEVEL;  [name], MD;  [name];  Service: Neurosurgery;  Laterality: Bilateral;   Lumbar Three-Four Decompression   LUMBAR LAMINECTOMY/DECOMPRESSION MICRODISCECTOMY N/A 07/26/2020   Procedure: Lumbar two-three Laminectomy/Foraminotomy;  [name], MD;  [name];  Service: Neurosurgery;  [name];   ORIF ANKLE FRACTURE Right 01/02/2023   Procedure: OPEN REDUCTION INTERNAL FIXATION (ORIF)RIGHT ANKLE FRACTURE;  [name], MD;  [name];  Service: Orthopedics;  Laterality: Right;   PERCUTANEOUS CORONARY STENT  INTERVENTION (PCI-S) N/A 04/28/2012   Procedure: PERCUTANEOUS CORONARY STENT INTERVENTION (PCI-S);  [name], MD;  Location: [facility] CATH LAB;  Service: Cardiovascular;  [name];   PERCUTANEOUS CORONARY STENT INTERVENTION (PCI-S) N/A 04/29/2012   Procedure: PERCUTANEOUS CORONARY STENT INTERVENTION (PCI-S);  [name], MD;  Location: [facility] CATH LAB;  Service: Cardiovascular;  [name];   PERIPHERAL VASCULAR ATHERECTOMY  11/06/2022   Procedure: PERIPHERAL VASCULAR ATHERECTOMY;  Surgeon: Darron Deatrice DELENA, MD;  [facility];  Service: Cardiovascular;;   PERIPHERAL VASCULAR BALLOON ANGIOPLASTY Right 10/13/2019   Procedure: PERIPHERAL VASCULAR BALLOON ANGIOPLASTY;  Surgeon: Darron Deatrice DELENA, MD;  [facility] CV LAB;  Service: Cardiovascular;  Laterality: Right;  PTCA of R TP Trunk into PTA (Drug-coated) => Follow-up ABIs 0.9 on the right and 0.99 on the left.   PERIPHERAL VASCULAR BALLOON ANGIOPLASTY Right 04/01/2023   Procedure: PERIPHERAL VASCULAR BALLOON ANGIOPLASTY;  Surgeon: Serene Gaile LELON, MD;  Location: [facility]AS[facility] CV LAB;  Service: Cardiovascular;  Laterality: Right;   PERIPHERAL VASCULAR INTERVENTION Right 04/01/2023   Procedure: PERIPHERAL VASCULAR INTERVENTION;  Surgeon: Serene Gaile LELON, MD;  [facility]: [facility] CV LAB;  Service: Cardiovascular;  Laterality: Right;   SPINE SURGERY     UMBILICAL HERNIA REPAIR  2009   steel mesh insert    Family History  Adopted: Yes  Family history unknown: Yes      Social History   Socioeconomic History   Marital status: Married    Spouse name: Not on file   Number of children: 0   Years of education: hs   Highest education level: Not on file  Occupational History   Occupation: Surveyor, quantity: replacement limited  Tobacco Use   Smoking status: Former    Current packs/day: 0.00    Average packs/day: 1 pack/day for 42.0 years (42.0 ttl pk-yrs)    Types:  Cigarettes    Start date: 03/22/1961    Quit date: 03/23/2003    Years since quitting: 20.6   Smokeless tobacco: Never   Tobacco comments:    Former smoker 02/19/22  Vaping Use   Vaping status: Never Used  Substance and Sexual Activity   Alcohol use: No    Alcohol/week: 0.0 standard drinks of alcohol   Drug use: No   Sexual activity: Not on file  Other Topics Concern   Not on file  Social History Narrative   He and his partner Zachary have now officially gotten married on 05/26/2013.  Zachary is also a patient of our clinic.  He is now initially hyphenated his last name.   He has now  retiredfrom his long-term employment at [employer]. ->  As of September 2020   He does not smoke, he quit in 2009.  He does not drink alcohol.   He was adopted and no family history is known.    Social Drivers of Corporate investment banker Strain: Not on file  Food Insecurity: No Food Insecurity (09/16/2023)   Hunger Vital Sign    Worried About Running Out of Food in the Last Year: Never true    Ran Out of Food in the Last Year: Never true  Transportation Needs: Unmet Transportation Needs (09/16/2023)   PRAPARE - Administrator, Civil Service (Medical): Yes    Lack of Transportation (Non-Medical): Yes  Physical Activity: Not on file  Stress: Not on file  Social Connections: Moderately Isolated (09/16/2023)   Social Connection and Isolation Panel    Frequency of Communication with Friends and Family: More than three times a week    Frequency of Social Gatherings with Friends and Family: Three times a week    Attends Religious Services: Never    Active Member of Clubs or Organizations: No    Attends Banker Meetings: Never    Marital Status: Married    Allergies  Allergen Reactions   Beta-Lactamase Inhibitors (Beta-Lactam) Hives, Itching and Rash    AX - penicillin     Ancef  given 9/24 with no obvious reaction   Other Nausea And Vomiting    General Anesthesia -  sometimes causes nausea and vomiting * OK to use scopolamine  patch     Wellbutrin [Bupropion] Other (See Comments)    Mood Changes   Metformin  And Related Diarrhea and Nausea Only   Sulfa Antibiotics Hives and Other (See Comments)    Bactrim   Tetracyclines & Related Hives and Rash    doxycycline      Current Outpatient Medications:    allopurinol  (ZYLOPRIM ) 100 MG tablet, Take 0.5 tablets (50 mg total) by mouth daily., Disp: 15 tablet, Rfl: 0   [Paused] amiodarone  (PACERONE ) 200 MG tablet, Take 1 tablet (200 mg total) by mouth 2 (two) times daily. (Patient not taking: No sig reported), Disp: 60 tablet, Rfl: 0   apixaban  (ELIQUIS ) 5 MG TABS tablet, TAKE 1 TABLET BY MOUTH TWICE A DAY, Disp: 60 tablet, Rfl: 6   atorvastatin  (LIPITOR) 40 MG tablet, Take 1 tablet (40 mg total) by mouth daily., Disp: 30 tablet, Rfl: 0   clopidogrel  (PLAVIX ) 75 MG tablet, Take 75 mg by mouth every morning., Disp: , Rfl:    doxycycline  (VIBRA -TABS) 100 MG tablet, Take 1 tablet (100 mg total) by mouth 2 (two) times daily., Disp: 60 tablet, Rfl: 5   DULoxetine  (CYMBALTA ) 60 MG capsule, Take 60 mg by mouth daily., Disp: , Rfl:    ezetimibe  (ZETIA ) 10 MG tablet, Take 10 mg by mouth every evening., Disp: , Rfl:    feeding supplement (ENSURE PLUS HIGH PROTEIN) LIQD, Take 237 mLs by mouth 2 (two) times daily between meals., Disp: , Rfl:    ferrous sulfate  325 (65 FE) MG tablet, Take 325 mg by mouth 3 (three) times a week. Monday, Wednesday, Friday, Disp: , Rfl:    gabapentin  (NEURONTIN ) 300 MG capsule, Take 1 capsule (300 mg total) by mouth at bedtime., Disp: 30 capsule, Rfl: 0   HYDROcodone -acetaminophen  (NORCO/VICODIN) 5-325 MG tablet, Take 1 tablet by mouth every 6 (six) hours as needed for moderate pain (pain score 4-6)., Disp: 30 tablet, Rfl: 0   hydrocortisone  cream 1 %, Apply topically 3 (three) times daily., Disp: ,  Rfl:    insulin  glargine (LANTUS  SOLOSTAR) 100 UNIT/ML Solostar Pen, Inject 8 Units into the skin at  bedtime., Disp: , Rfl:    insulin  lispro (HUMALOG) 100 UNIT/ML injection, Inject 0-15 Units into the skin in the morning, at noon, in the evening, and at bedtime. Per sliding scale: 0-70= 0 units if <70 implement hypoglycemic protocol & notify MD/NP 71-150= 0 units 151-200=3 units 201-250= 6 units 251-300= 9 units 301-350= 12 units 351-400= 15 units 401-999 if CBG>400. Notify MD/ NP,, Disp: , Rfl:    levofloxacin  (LEVAQUIN ) 750 MG tablet, Take 1 tablet (750 mg total) by mouth daily., Disp: 30 tablet, Rfl: 5   levothyroxine  (SYNTHROID , LEVOTHROID) 88 MCG tablet, Take 88 mcg by mouth daily before breakfast., Disp: , Rfl:    losartan  (COZAAR ) 25 MG tablet, Take 25 mg by mouth daily., Disp: , Rfl:    midodrine  (PROAMATINE ) 5 MG tablet, Take 5 mg by mouth every 8 (eight) hours as needed (SBP < 110)., Disp: , Rfl:    nitroGLYCERIN  (NITROSTAT ) 0.4 MG SL tablet, PLACE 1 TAB UNDER THE TONGUE EVERY 5 MINUTES AS NEEDED FOR CHEST PAIN (SEVERE PRESSURE OR TIGHTNESS), Disp: 25 tablet, Rfl: 3   ondansetron  (ZOFRAN ) 4 MG tablet, Take 1 tablet (4 mg total) by mouth every 8 (eight) hours as needed for nausea or vomiting., Disp: , Rfl:    polyethylene glycol (MIRALAX  / GLYCOLAX ) 17 g packet, Take 17 g by mouth daily as needed for mild constipation., Disp: 14 each, Rfl: 0   tamsulosin  (FLOMAX ) 0.4 MG CAPS capsule, Take 0.4 mg by mouth., Disp: , Rfl:    tirzepatide  (MOUNJARO ) 15 MG/0.5ML Pen, Inject 15 mg into the skin once a week., Disp: 6 mL, Rfl: 3   traMADol (ULTRAM) 50 MG tablet, Take 50 mg by mouth every 6 (six) hours as needed for moderate pain (pain score 4-6)., Disp: , Rfl:    Vitamin D , Ergocalciferol , (DRISDOL ) 1.25 MG (50000 UNIT) CAPS capsule, Take 1 capsule (50,000 Units total) by mouth every 7 (seven) days., Disp: 4 capsule, Rfl: 0   Review of Systems     Objective:   Physical Exam        Assessment & Plan:

## 2023-11-05 ENCOUNTER — Other Ambulatory Visit: Payer: Self-pay

## 2023-11-05 ENCOUNTER — Telehealth (INDEPENDENT_AMBULATORY_CARE_PROVIDER_SITE_OTHER): Admitting: Infectious Disease

## 2023-11-05 DIAGNOSIS — Z794 Long term (current) use of insulin: Secondary | ICD-10-CM

## 2023-11-05 DIAGNOSIS — M86671 Other chronic osteomyelitis, right ankle and foot: Secondary | ICD-10-CM | POA: Diagnosis not present

## 2023-11-05 DIAGNOSIS — T847XXD Infection and inflammatory reaction due to other internal orthopedic prosthetic devices, implants and grafts, subsequent encounter: Secondary | ICD-10-CM | POA: Diagnosis not present

## 2023-11-05 DIAGNOSIS — M86171 Other acute osteomyelitis, right ankle and foot: Secondary | ICD-10-CM | POA: Diagnosis not present

## 2023-11-05 DIAGNOSIS — I70234 Atherosclerosis of native arteries of right leg with ulceration of heel and midfoot: Secondary | ICD-10-CM | POA: Diagnosis not present

## 2023-11-05 DIAGNOSIS — N1831 Chronic kidney disease, stage 3a: Secondary | ICD-10-CM

## 2023-11-05 DIAGNOSIS — I739 Peripheral vascular disease, unspecified: Secondary | ICD-10-CM

## 2023-11-05 DIAGNOSIS — I872 Venous insufficiency (chronic) (peripheral): Secondary | ICD-10-CM | POA: Diagnosis not present

## 2023-11-05 DIAGNOSIS — I5032 Chronic diastolic (congestive) heart failure: Secondary | ICD-10-CM | POA: Diagnosis not present

## 2023-11-05 DIAGNOSIS — E1165 Type 2 diabetes mellitus with hyperglycemia: Secondary | ICD-10-CM | POA: Diagnosis not present

## 2023-11-05 DIAGNOSIS — Z758 Other problems related to medical facilities and other health care: Secondary | ICD-10-CM | POA: Diagnosis not present

## 2023-11-05 DIAGNOSIS — E785 Hyperlipidemia, unspecified: Secondary | ICD-10-CM | POA: Diagnosis not present

## 2023-11-05 DIAGNOSIS — N184 Chronic kidney disease, stage 4 (severe): Secondary | ICD-10-CM | POA: Diagnosis not present

## 2023-11-05 DIAGNOSIS — J449 Chronic obstructive pulmonary disease, unspecified: Secondary | ICD-10-CM | POA: Diagnosis not present

## 2023-11-05 DIAGNOSIS — I272 Pulmonary hypertension, unspecified: Secondary | ICD-10-CM | POA: Diagnosis not present

## 2023-11-05 DIAGNOSIS — E1122 Type 2 diabetes mellitus with diabetic chronic kidney disease: Secondary | ICD-10-CM | POA: Diagnosis not present

## 2023-11-05 DIAGNOSIS — M86661 Other chronic osteomyelitis, right tibia and fibula: Secondary | ICD-10-CM

## 2023-11-05 DIAGNOSIS — E1140 Type 2 diabetes mellitus with diabetic neuropathy, unspecified: Secondary | ICD-10-CM | POA: Diagnosis not present

## 2023-11-05 DIAGNOSIS — L97929 Non-pressure chronic ulcer of unspecified part of left lower leg with unspecified severity: Secondary | ICD-10-CM

## 2023-11-05 DIAGNOSIS — I480 Paroxysmal atrial fibrillation: Secondary | ICD-10-CM | POA: Diagnosis not present

## 2023-11-05 DIAGNOSIS — M86179 Other acute osteomyelitis, unspecified ankle and foot: Secondary | ICD-10-CM

## 2023-11-05 DIAGNOSIS — E1142 Type 2 diabetes mellitus with diabetic polyneuropathy: Secondary | ICD-10-CM | POA: Diagnosis not present

## 2023-11-05 DIAGNOSIS — I130 Hypertensive heart and chronic kidney disease with heart failure and stage 1 through stage 4 chronic kidney disease, or unspecified chronic kidney disease: Secondary | ICD-10-CM | POA: Diagnosis not present

## 2023-11-05 DIAGNOSIS — I2510 Atherosclerotic heart disease of native coronary artery without angina pectoris: Secondary | ICD-10-CM

## 2023-11-05 DIAGNOSIS — M86369 Chronic multifocal osteomyelitis, unspecified tibia and fibula: Secondary | ICD-10-CM

## 2023-11-05 DIAGNOSIS — L97919 Non-pressure chronic ulcer of unspecified part of right lower leg with unspecified severity: Secondary | ICD-10-CM

## 2023-11-05 DIAGNOSIS — Z7409 Other reduced mobility: Secondary | ICD-10-CM | POA: Diagnosis not present

## 2023-11-05 DIAGNOSIS — E1169 Type 2 diabetes mellitus with other specified complication: Secondary | ICD-10-CM | POA: Diagnosis not present

## 2023-11-05 DIAGNOSIS — S91001D Unspecified open wound, right ankle, subsequent encounter: Secondary | ICD-10-CM

## 2023-11-05 DIAGNOSIS — D631 Anemia in chronic kidney disease: Secondary | ICD-10-CM | POA: Diagnosis not present

## 2023-11-05 DIAGNOSIS — K219 Gastro-esophageal reflux disease without esophagitis: Secondary | ICD-10-CM | POA: Diagnosis not present

## 2023-11-05 DIAGNOSIS — E43 Unspecified severe protein-calorie malnutrition: Secondary | ICD-10-CM | POA: Diagnosis not present

## 2023-11-05 DIAGNOSIS — E1151 Type 2 diabetes mellitus with diabetic peripheral angiopathy without gangrene: Secondary | ICD-10-CM | POA: Diagnosis not present

## 2023-11-05 DIAGNOSIS — T84624A Infection and inflammatory reaction due to internal fixation device of right fibula, initial encounter: Secondary | ICD-10-CM | POA: Diagnosis not present

## 2023-11-05 DIAGNOSIS — E039 Hypothyroidism, unspecified: Secondary | ICD-10-CM | POA: Diagnosis not present

## 2023-11-06 DIAGNOSIS — N184 Chronic kidney disease, stage 4 (severe): Secondary | ICD-10-CM | POA: Diagnosis not present

## 2023-11-06 DIAGNOSIS — I130 Hypertensive heart and chronic kidney disease with heart failure and stage 1 through stage 4 chronic kidney disease, or unspecified chronic kidney disease: Secondary | ICD-10-CM | POA: Diagnosis not present

## 2023-11-06 DIAGNOSIS — Z794 Long term (current) use of insulin: Secondary | ICD-10-CM | POA: Diagnosis not present

## 2023-11-06 DIAGNOSIS — I272 Pulmonary hypertension, unspecified: Secondary | ICD-10-CM | POA: Diagnosis not present

## 2023-11-06 DIAGNOSIS — E039 Hypothyroidism, unspecified: Secondary | ICD-10-CM | POA: Diagnosis not present

## 2023-11-06 DIAGNOSIS — M86171 Other acute osteomyelitis, right ankle and foot: Secondary | ICD-10-CM | POA: Diagnosis not present

## 2023-11-06 DIAGNOSIS — E43 Unspecified severe protein-calorie malnutrition: Secondary | ICD-10-CM | POA: Diagnosis not present

## 2023-11-06 DIAGNOSIS — E1169 Type 2 diabetes mellitus with other specified complication: Secondary | ICD-10-CM | POA: Diagnosis not present

## 2023-11-06 DIAGNOSIS — I5032 Chronic diastolic (congestive) heart failure: Secondary | ICD-10-CM | POA: Diagnosis not present

## 2023-11-06 DIAGNOSIS — D631 Anemia in chronic kidney disease: Secondary | ICD-10-CM | POA: Diagnosis not present

## 2023-11-06 DIAGNOSIS — E1165 Type 2 diabetes mellitus with hyperglycemia: Secondary | ICD-10-CM | POA: Diagnosis not present

## 2023-11-06 DIAGNOSIS — E1151 Type 2 diabetes mellitus with diabetic peripheral angiopathy without gangrene: Secondary | ICD-10-CM | POA: Diagnosis not present

## 2023-11-06 DIAGNOSIS — J449 Chronic obstructive pulmonary disease, unspecified: Secondary | ICD-10-CM | POA: Diagnosis not present

## 2023-11-06 DIAGNOSIS — E1140 Type 2 diabetes mellitus with diabetic neuropathy, unspecified: Secondary | ICD-10-CM | POA: Diagnosis not present

## 2023-11-06 DIAGNOSIS — I70234 Atherosclerosis of native arteries of right leg with ulceration of heel and midfoot: Secondary | ICD-10-CM | POA: Diagnosis not present

## 2023-11-06 DIAGNOSIS — T84624A Infection and inflammatory reaction due to internal fixation device of right fibula, initial encounter: Secondary | ICD-10-CM | POA: Diagnosis not present

## 2023-11-06 DIAGNOSIS — I480 Paroxysmal atrial fibrillation: Secondary | ICD-10-CM | POA: Diagnosis not present

## 2023-11-06 DIAGNOSIS — E1122 Type 2 diabetes mellitus with diabetic chronic kidney disease: Secondary | ICD-10-CM | POA: Diagnosis not present

## 2023-11-06 DIAGNOSIS — I2510 Atherosclerotic heart disease of native coronary artery without angina pectoris: Secondary | ICD-10-CM | POA: Diagnosis not present

## 2023-11-06 DIAGNOSIS — I872 Venous insufficiency (chronic) (peripheral): Secondary | ICD-10-CM | POA: Diagnosis not present

## 2023-11-06 DIAGNOSIS — K219 Gastro-esophageal reflux disease without esophagitis: Secondary | ICD-10-CM | POA: Diagnosis not present

## 2023-11-06 DIAGNOSIS — E785 Hyperlipidemia, unspecified: Secondary | ICD-10-CM | POA: Diagnosis not present

## 2023-11-07 ENCOUNTER — Encounter: Payer: Self-pay | Admitting: Infectious Disease

## 2023-11-07 DIAGNOSIS — E860 Dehydration: Secondary | ICD-10-CM | POA: Diagnosis not present

## 2023-11-07 DIAGNOSIS — L89159 Pressure ulcer of sacral region, unspecified stage: Secondary | ICD-10-CM | POA: Diagnosis not present

## 2023-11-10 DIAGNOSIS — E1140 Type 2 diabetes mellitus with diabetic neuropathy, unspecified: Secondary | ICD-10-CM | POA: Diagnosis not present

## 2023-11-10 DIAGNOSIS — D631 Anemia in chronic kidney disease: Secondary | ICD-10-CM | POA: Diagnosis not present

## 2023-11-10 DIAGNOSIS — E1165 Type 2 diabetes mellitus with hyperglycemia: Secondary | ICD-10-CM | POA: Diagnosis not present

## 2023-11-10 DIAGNOSIS — E1151 Type 2 diabetes mellitus with diabetic peripheral angiopathy without gangrene: Secondary | ICD-10-CM | POA: Diagnosis not present

## 2023-11-10 DIAGNOSIS — I5032 Chronic diastolic (congestive) heart failure: Secondary | ICD-10-CM | POA: Diagnosis not present

## 2023-11-10 DIAGNOSIS — I2510 Atherosclerotic heart disease of native coronary artery without angina pectoris: Secondary | ICD-10-CM | POA: Diagnosis not present

## 2023-11-10 DIAGNOSIS — E43 Unspecified severe protein-calorie malnutrition: Secondary | ICD-10-CM | POA: Diagnosis not present

## 2023-11-10 DIAGNOSIS — E785 Hyperlipidemia, unspecified: Secondary | ICD-10-CM | POA: Diagnosis not present

## 2023-11-10 DIAGNOSIS — I130 Hypertensive heart and chronic kidney disease with heart failure and stage 1 through stage 4 chronic kidney disease, or unspecified chronic kidney disease: Secondary | ICD-10-CM | POA: Diagnosis not present

## 2023-11-10 DIAGNOSIS — I480 Paroxysmal atrial fibrillation: Secondary | ICD-10-CM | POA: Diagnosis not present

## 2023-11-10 DIAGNOSIS — T84624A Infection and inflammatory reaction due to internal fixation device of right fibula, initial encounter: Secondary | ICD-10-CM | POA: Diagnosis not present

## 2023-11-10 DIAGNOSIS — E1169 Type 2 diabetes mellitus with other specified complication: Secondary | ICD-10-CM | POA: Diagnosis not present

## 2023-11-10 DIAGNOSIS — K219 Gastro-esophageal reflux disease without esophagitis: Secondary | ICD-10-CM | POA: Diagnosis not present

## 2023-11-10 DIAGNOSIS — E039 Hypothyroidism, unspecified: Secondary | ICD-10-CM | POA: Diagnosis not present

## 2023-11-10 DIAGNOSIS — M86171 Other acute osteomyelitis, right ankle and foot: Secondary | ICD-10-CM | POA: Diagnosis not present

## 2023-11-10 DIAGNOSIS — E1122 Type 2 diabetes mellitus with diabetic chronic kidney disease: Secondary | ICD-10-CM | POA: Diagnosis not present

## 2023-11-10 DIAGNOSIS — I272 Pulmonary hypertension, unspecified: Secondary | ICD-10-CM | POA: Diagnosis not present

## 2023-11-10 DIAGNOSIS — N184 Chronic kidney disease, stage 4 (severe): Secondary | ICD-10-CM | POA: Diagnosis not present

## 2023-11-10 DIAGNOSIS — I70234 Atherosclerosis of native arteries of right leg with ulceration of heel and midfoot: Secondary | ICD-10-CM | POA: Diagnosis not present

## 2023-11-10 DIAGNOSIS — I872 Venous insufficiency (chronic) (peripheral): Secondary | ICD-10-CM | POA: Diagnosis not present

## 2023-11-10 DIAGNOSIS — Z794 Long term (current) use of insulin: Secondary | ICD-10-CM | POA: Diagnosis not present

## 2023-11-10 DIAGNOSIS — J449 Chronic obstructive pulmonary disease, unspecified: Secondary | ICD-10-CM | POA: Diagnosis not present

## 2023-11-11 DIAGNOSIS — J449 Chronic obstructive pulmonary disease, unspecified: Secondary | ICD-10-CM | POA: Diagnosis not present

## 2023-11-11 DIAGNOSIS — I130 Hypertensive heart and chronic kidney disease with heart failure and stage 1 through stage 4 chronic kidney disease, or unspecified chronic kidney disease: Secondary | ICD-10-CM | POA: Diagnosis not present

## 2023-11-11 DIAGNOSIS — I2510 Atherosclerotic heart disease of native coronary artery without angina pectoris: Secondary | ICD-10-CM | POA: Diagnosis not present

## 2023-11-11 DIAGNOSIS — Z794 Long term (current) use of insulin: Secondary | ICD-10-CM | POA: Diagnosis not present

## 2023-11-11 DIAGNOSIS — E785 Hyperlipidemia, unspecified: Secondary | ICD-10-CM | POA: Diagnosis not present

## 2023-11-11 DIAGNOSIS — I70234 Atherosclerosis of native arteries of right leg with ulceration of heel and midfoot: Secondary | ICD-10-CM | POA: Diagnosis not present

## 2023-11-11 DIAGNOSIS — E1151 Type 2 diabetes mellitus with diabetic peripheral angiopathy without gangrene: Secondary | ICD-10-CM | POA: Diagnosis not present

## 2023-11-11 DIAGNOSIS — I272 Pulmonary hypertension, unspecified: Secondary | ICD-10-CM | POA: Diagnosis not present

## 2023-11-11 DIAGNOSIS — E1122 Type 2 diabetes mellitus with diabetic chronic kidney disease: Secondary | ICD-10-CM | POA: Diagnosis not present

## 2023-11-11 DIAGNOSIS — E039 Hypothyroidism, unspecified: Secondary | ICD-10-CM | POA: Diagnosis not present

## 2023-11-11 DIAGNOSIS — D631 Anemia in chronic kidney disease: Secondary | ICD-10-CM | POA: Diagnosis not present

## 2023-11-11 DIAGNOSIS — M86171 Other acute osteomyelitis, right ankle and foot: Secondary | ICD-10-CM | POA: Diagnosis not present

## 2023-11-11 DIAGNOSIS — E1165 Type 2 diabetes mellitus with hyperglycemia: Secondary | ICD-10-CM | POA: Diagnosis not present

## 2023-11-11 DIAGNOSIS — K219 Gastro-esophageal reflux disease without esophagitis: Secondary | ICD-10-CM | POA: Diagnosis not present

## 2023-11-11 DIAGNOSIS — E43 Unspecified severe protein-calorie malnutrition: Secondary | ICD-10-CM | POA: Diagnosis not present

## 2023-11-11 DIAGNOSIS — I480 Paroxysmal atrial fibrillation: Secondary | ICD-10-CM | POA: Diagnosis not present

## 2023-11-11 DIAGNOSIS — T84624A Infection and inflammatory reaction due to internal fixation device of right fibula, initial encounter: Secondary | ICD-10-CM | POA: Diagnosis not present

## 2023-11-11 DIAGNOSIS — E1169 Type 2 diabetes mellitus with other specified complication: Secondary | ICD-10-CM | POA: Diagnosis not present

## 2023-11-11 DIAGNOSIS — I5032 Chronic diastolic (congestive) heart failure: Secondary | ICD-10-CM | POA: Diagnosis not present

## 2023-11-11 DIAGNOSIS — E1140 Type 2 diabetes mellitus with diabetic neuropathy, unspecified: Secondary | ICD-10-CM | POA: Diagnosis not present

## 2023-11-11 DIAGNOSIS — I872 Venous insufficiency (chronic) (peripheral): Secondary | ICD-10-CM | POA: Diagnosis not present

## 2023-11-11 DIAGNOSIS — N184 Chronic kidney disease, stage 4 (severe): Secondary | ICD-10-CM | POA: Diagnosis not present

## 2023-11-13 DIAGNOSIS — E1122 Type 2 diabetes mellitus with diabetic chronic kidney disease: Secondary | ICD-10-CM | POA: Diagnosis not present

## 2023-11-13 DIAGNOSIS — K219 Gastro-esophageal reflux disease without esophagitis: Secondary | ICD-10-CM | POA: Diagnosis not present

## 2023-11-13 DIAGNOSIS — I872 Venous insufficiency (chronic) (peripheral): Secondary | ICD-10-CM | POA: Diagnosis not present

## 2023-11-13 DIAGNOSIS — Z794 Long term (current) use of insulin: Secondary | ICD-10-CM | POA: Diagnosis not present

## 2023-11-13 DIAGNOSIS — Z7401 Bed confinement status: Secondary | ICD-10-CM | POA: Diagnosis not present

## 2023-11-13 DIAGNOSIS — D631 Anemia in chronic kidney disease: Secondary | ICD-10-CM | POA: Diagnosis not present

## 2023-11-13 DIAGNOSIS — E039 Hypothyroidism, unspecified: Secondary | ICD-10-CM | POA: Diagnosis not present

## 2023-11-13 DIAGNOSIS — J449 Chronic obstructive pulmonary disease, unspecified: Secondary | ICD-10-CM | POA: Diagnosis not present

## 2023-11-13 DIAGNOSIS — T84624A Infection and inflammatory reaction due to internal fixation device of right fibula, initial encounter: Secondary | ICD-10-CM | POA: Diagnosis not present

## 2023-11-13 DIAGNOSIS — I272 Pulmonary hypertension, unspecified: Secondary | ICD-10-CM | POA: Diagnosis not present

## 2023-11-13 DIAGNOSIS — E1165 Type 2 diabetes mellitus with hyperglycemia: Secondary | ICD-10-CM | POA: Diagnosis not present

## 2023-11-13 DIAGNOSIS — N189 Chronic kidney disease, unspecified: Secondary | ICD-10-CM | POA: Diagnosis not present

## 2023-11-13 DIAGNOSIS — I480 Paroxysmal atrial fibrillation: Secondary | ICD-10-CM | POA: Diagnosis not present

## 2023-11-13 DIAGNOSIS — E785 Hyperlipidemia, unspecified: Secondary | ICD-10-CM | POA: Diagnosis not present

## 2023-11-13 DIAGNOSIS — E1151 Type 2 diabetes mellitus with diabetic peripheral angiopathy without gangrene: Secondary | ICD-10-CM | POA: Diagnosis not present

## 2023-11-13 DIAGNOSIS — I5032 Chronic diastolic (congestive) heart failure: Secondary | ICD-10-CM | POA: Diagnosis not present

## 2023-11-13 DIAGNOSIS — E43 Unspecified severe protein-calorie malnutrition: Secondary | ICD-10-CM | POA: Diagnosis not present

## 2023-11-13 DIAGNOSIS — E1169 Type 2 diabetes mellitus with other specified complication: Secondary | ICD-10-CM | POA: Diagnosis not present

## 2023-11-13 DIAGNOSIS — E1140 Type 2 diabetes mellitus with diabetic neuropathy, unspecified: Secondary | ICD-10-CM | POA: Diagnosis not present

## 2023-11-13 DIAGNOSIS — E1149 Type 2 diabetes mellitus with other diabetic neurological complication: Secondary | ICD-10-CM | POA: Diagnosis not present

## 2023-11-13 DIAGNOSIS — I130 Hypertensive heart and chronic kidney disease with heart failure and stage 1 through stage 4 chronic kidney disease, or unspecified chronic kidney disease: Secondary | ICD-10-CM | POA: Diagnosis not present

## 2023-11-13 DIAGNOSIS — N184 Chronic kidney disease, stage 4 (severe): Secondary | ICD-10-CM | POA: Diagnosis not present

## 2023-11-13 DIAGNOSIS — E559 Vitamin D deficiency, unspecified: Secondary | ICD-10-CM | POA: Diagnosis not present

## 2023-11-13 DIAGNOSIS — I70234 Atherosclerosis of native arteries of right leg with ulceration of heel and midfoot: Secondary | ICD-10-CM | POA: Diagnosis not present

## 2023-11-13 DIAGNOSIS — I2510 Atherosclerotic heart disease of native coronary artery without angina pectoris: Secondary | ICD-10-CM | POA: Diagnosis not present

## 2023-11-13 DIAGNOSIS — M86171 Other acute osteomyelitis, right ankle and foot: Secondary | ICD-10-CM | POA: Diagnosis not present

## 2023-11-14 LAB — LAB REPORT - SCANNED: EGFR: 41

## 2023-11-17 ENCOUNTER — Encounter: Payer: Self-pay | Admitting: Infectious Disease

## 2023-11-17 DIAGNOSIS — M86171 Other acute osteomyelitis, right ankle and foot: Secondary | ICD-10-CM | POA: Diagnosis not present

## 2023-11-17 DIAGNOSIS — E1122 Type 2 diabetes mellitus with diabetic chronic kidney disease: Secondary | ICD-10-CM | POA: Diagnosis not present

## 2023-11-17 DIAGNOSIS — E1165 Type 2 diabetes mellitus with hyperglycemia: Secondary | ICD-10-CM | POA: Diagnosis not present

## 2023-11-17 DIAGNOSIS — E039 Hypothyroidism, unspecified: Secondary | ICD-10-CM | POA: Diagnosis not present

## 2023-11-17 DIAGNOSIS — I5032 Chronic diastolic (congestive) heart failure: Secondary | ICD-10-CM | POA: Diagnosis not present

## 2023-11-17 DIAGNOSIS — N184 Chronic kidney disease, stage 4 (severe): Secondary | ICD-10-CM | POA: Diagnosis not present

## 2023-11-17 DIAGNOSIS — E43 Unspecified severe protein-calorie malnutrition: Secondary | ICD-10-CM | POA: Diagnosis not present

## 2023-11-17 DIAGNOSIS — E1151 Type 2 diabetes mellitus with diabetic peripheral angiopathy without gangrene: Secondary | ICD-10-CM | POA: Diagnosis not present

## 2023-11-17 DIAGNOSIS — I70234 Atherosclerosis of native arteries of right leg with ulceration of heel and midfoot: Secondary | ICD-10-CM | POA: Diagnosis not present

## 2023-11-17 DIAGNOSIS — I130 Hypertensive heart and chronic kidney disease with heart failure and stage 1 through stage 4 chronic kidney disease, or unspecified chronic kidney disease: Secondary | ICD-10-CM | POA: Diagnosis not present

## 2023-11-17 DIAGNOSIS — I2510 Atherosclerotic heart disease of native coronary artery without angina pectoris: Secondary | ICD-10-CM | POA: Diagnosis not present

## 2023-11-17 DIAGNOSIS — D631 Anemia in chronic kidney disease: Secondary | ICD-10-CM | POA: Diagnosis not present

## 2023-11-17 DIAGNOSIS — T84624A Infection and inflammatory reaction due to internal fixation device of right fibula, initial encounter: Secondary | ICD-10-CM | POA: Diagnosis not present

## 2023-11-17 DIAGNOSIS — Z794 Long term (current) use of insulin: Secondary | ICD-10-CM | POA: Diagnosis not present

## 2023-11-17 DIAGNOSIS — E785 Hyperlipidemia, unspecified: Secondary | ICD-10-CM | POA: Diagnosis not present

## 2023-11-17 DIAGNOSIS — I872 Venous insufficiency (chronic) (peripheral): Secondary | ICD-10-CM | POA: Diagnosis not present

## 2023-11-17 DIAGNOSIS — J449 Chronic obstructive pulmonary disease, unspecified: Secondary | ICD-10-CM | POA: Diagnosis not present

## 2023-11-17 DIAGNOSIS — I272 Pulmonary hypertension, unspecified: Secondary | ICD-10-CM | POA: Diagnosis not present

## 2023-11-17 DIAGNOSIS — I480 Paroxysmal atrial fibrillation: Secondary | ICD-10-CM | POA: Diagnosis not present

## 2023-11-17 DIAGNOSIS — K219 Gastro-esophageal reflux disease without esophagitis: Secondary | ICD-10-CM | POA: Diagnosis not present

## 2023-11-17 DIAGNOSIS — E1169 Type 2 diabetes mellitus with other specified complication: Secondary | ICD-10-CM | POA: Diagnosis not present

## 2023-11-17 DIAGNOSIS — E1140 Type 2 diabetes mellitus with diabetic neuropathy, unspecified: Secondary | ICD-10-CM | POA: Diagnosis not present

## 2023-11-18 ENCOUNTER — Ambulatory Visit (INDEPENDENT_AMBULATORY_CARE_PROVIDER_SITE_OTHER): Admitting: Licensed Clinical Social Worker

## 2023-11-18 ENCOUNTER — Encounter (HOSPITAL_COMMUNITY): Payer: Self-pay

## 2023-11-18 DIAGNOSIS — G8929 Other chronic pain: Secondary | ICD-10-CM | POA: Diagnosis not present

## 2023-11-18 DIAGNOSIS — J449 Chronic obstructive pulmonary disease, unspecified: Secondary | ICD-10-CM | POA: Diagnosis not present

## 2023-11-18 DIAGNOSIS — D649 Anemia, unspecified: Secondary | ICD-10-CM | POA: Diagnosis not present

## 2023-11-18 DIAGNOSIS — F332 Major depressive disorder, recurrent severe without psychotic features: Secondary | ICD-10-CM | POA: Diagnosis not present

## 2023-11-18 DIAGNOSIS — I480 Paroxysmal atrial fibrillation: Secondary | ICD-10-CM | POA: Diagnosis not present

## 2023-11-18 DIAGNOSIS — E785 Hyperlipidemia, unspecified: Secondary | ICD-10-CM | POA: Diagnosis not present

## 2023-11-18 DIAGNOSIS — E1165 Type 2 diabetes mellitus with hyperglycemia: Secondary | ICD-10-CM | POA: Diagnosis not present

## 2023-11-18 DIAGNOSIS — I2510 Atherosclerotic heart disease of native coronary artery without angina pectoris: Secondary | ICD-10-CM | POA: Diagnosis not present

## 2023-11-18 DIAGNOSIS — E1140 Type 2 diabetes mellitus with diabetic neuropathy, unspecified: Secondary | ICD-10-CM | POA: Diagnosis not present

## 2023-11-18 DIAGNOSIS — I130 Hypertensive heart and chronic kidney disease with heart failure and stage 1 through stage 4 chronic kidney disease, or unspecified chronic kidney disease: Secondary | ICD-10-CM | POA: Diagnosis not present

## 2023-11-18 DIAGNOSIS — M86171 Other acute osteomyelitis, right ankle and foot: Secondary | ICD-10-CM | POA: Diagnosis not present

## 2023-11-18 DIAGNOSIS — D631 Anemia in chronic kidney disease: Secondary | ICD-10-CM | POA: Diagnosis not present

## 2023-11-18 DIAGNOSIS — I872 Venous insufficiency (chronic) (peripheral): Secondary | ICD-10-CM | POA: Diagnosis not present

## 2023-11-18 DIAGNOSIS — E1122 Type 2 diabetes mellitus with diabetic chronic kidney disease: Secondary | ICD-10-CM | POA: Diagnosis not present

## 2023-11-18 DIAGNOSIS — I70213 Atherosclerosis of native arteries of extremities with intermittent claudication, bilateral legs: Secondary | ICD-10-CM | POA: Diagnosis not present

## 2023-11-18 DIAGNOSIS — E43 Unspecified severe protein-calorie malnutrition: Secondary | ICD-10-CM | POA: Diagnosis not present

## 2023-11-18 DIAGNOSIS — K219 Gastro-esophageal reflux disease without esophagitis: Secondary | ICD-10-CM | POA: Diagnosis not present

## 2023-11-18 DIAGNOSIS — E1151 Type 2 diabetes mellitus with diabetic peripheral angiopathy without gangrene: Secondary | ICD-10-CM | POA: Diagnosis not present

## 2023-11-18 DIAGNOSIS — I5032 Chronic diastolic (congestive) heart failure: Secondary | ICD-10-CM | POA: Diagnosis not present

## 2023-11-18 DIAGNOSIS — Z794 Long term (current) use of insulin: Secondary | ICD-10-CM | POA: Diagnosis not present

## 2023-11-18 DIAGNOSIS — I4821 Permanent atrial fibrillation: Secondary | ICD-10-CM | POA: Diagnosis not present

## 2023-11-18 DIAGNOSIS — T84624A Infection and inflammatory reaction due to internal fixation device of right fibula, initial encounter: Secondary | ICD-10-CM | POA: Diagnosis not present

## 2023-11-18 DIAGNOSIS — E039 Hypothyroidism, unspecified: Secondary | ICD-10-CM | POA: Diagnosis not present

## 2023-11-18 DIAGNOSIS — S82891D Other fracture of right lower leg, subsequent encounter for closed fracture with routine healing: Secondary | ICD-10-CM | POA: Diagnosis not present

## 2023-11-18 DIAGNOSIS — I70234 Atherosclerosis of native arteries of right leg with ulceration of heel and midfoot: Secondary | ICD-10-CM | POA: Diagnosis not present

## 2023-11-18 DIAGNOSIS — S82851E Displaced trimalleolar fracture of right lower leg, subsequent encounter for open fracture type I or II with routine healing: Secondary | ICD-10-CM | POA: Diagnosis not present

## 2023-11-18 DIAGNOSIS — I10 Essential (primary) hypertension: Secondary | ICD-10-CM | POA: Diagnosis not present

## 2023-11-18 DIAGNOSIS — E1169 Type 2 diabetes mellitus with other specified complication: Secondary | ICD-10-CM | POA: Diagnosis not present

## 2023-11-18 DIAGNOSIS — N184 Chronic kidney disease, stage 4 (severe): Secondary | ICD-10-CM | POA: Diagnosis not present

## 2023-11-18 DIAGNOSIS — I272 Pulmonary hypertension, unspecified: Secondary | ICD-10-CM | POA: Diagnosis not present

## 2023-11-18 NOTE — Progress Notes (Signed)
Bill
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
## 2023-11-21 ENCOUNTER — Encounter: Payer: Self-pay | Admitting: Cardiovascular Disease

## 2023-11-21 DIAGNOSIS — I130 Hypertensive heart and chronic kidney disease with heart failure and stage 1 through stage 4 chronic kidney disease, or unspecified chronic kidney disease: Secondary | ICD-10-CM | POA: Diagnosis not present

## 2023-11-21 DIAGNOSIS — E1169 Type 2 diabetes mellitus with other specified complication: Secondary | ICD-10-CM | POA: Diagnosis not present

## 2023-11-21 DIAGNOSIS — I480 Paroxysmal atrial fibrillation: Secondary | ICD-10-CM | POA: Diagnosis not present

## 2023-11-21 DIAGNOSIS — E039 Hypothyroidism, unspecified: Secondary | ICD-10-CM | POA: Diagnosis not present

## 2023-11-21 DIAGNOSIS — I70234 Atherosclerosis of native arteries of right leg with ulceration of heel and midfoot: Secondary | ICD-10-CM | POA: Diagnosis not present

## 2023-11-21 DIAGNOSIS — M86171 Other acute osteomyelitis, right ankle and foot: Secondary | ICD-10-CM | POA: Diagnosis not present

## 2023-11-21 DIAGNOSIS — Z794 Long term (current) use of insulin: Secondary | ICD-10-CM | POA: Diagnosis not present

## 2023-11-21 DIAGNOSIS — J449 Chronic obstructive pulmonary disease, unspecified: Secondary | ICD-10-CM | POA: Diagnosis not present

## 2023-11-21 DIAGNOSIS — I2510 Atherosclerotic heart disease of native coronary artery without angina pectoris: Secondary | ICD-10-CM | POA: Diagnosis not present

## 2023-11-21 DIAGNOSIS — I5032 Chronic diastolic (congestive) heart failure: Secondary | ICD-10-CM | POA: Diagnosis not present

## 2023-11-21 DIAGNOSIS — E1165 Type 2 diabetes mellitus with hyperglycemia: Secondary | ICD-10-CM | POA: Diagnosis not present

## 2023-11-21 DIAGNOSIS — T84624A Infection and inflammatory reaction due to internal fixation device of right fibula, initial encounter: Secondary | ICD-10-CM | POA: Diagnosis not present

## 2023-11-21 DIAGNOSIS — E1122 Type 2 diabetes mellitus with diabetic chronic kidney disease: Secondary | ICD-10-CM | POA: Diagnosis not present

## 2023-11-21 DIAGNOSIS — E1140 Type 2 diabetes mellitus with diabetic neuropathy, unspecified: Secondary | ICD-10-CM | POA: Diagnosis not present

## 2023-11-21 DIAGNOSIS — I272 Pulmonary hypertension, unspecified: Secondary | ICD-10-CM | POA: Diagnosis not present

## 2023-11-21 DIAGNOSIS — N184 Chronic kidney disease, stage 4 (severe): Secondary | ICD-10-CM | POA: Diagnosis not present

## 2023-11-21 DIAGNOSIS — E785 Hyperlipidemia, unspecified: Secondary | ICD-10-CM | POA: Diagnosis not present

## 2023-11-21 DIAGNOSIS — E43 Unspecified severe protein-calorie malnutrition: Secondary | ICD-10-CM | POA: Diagnosis not present

## 2023-11-21 DIAGNOSIS — E1151 Type 2 diabetes mellitus with diabetic peripheral angiopathy without gangrene: Secondary | ICD-10-CM | POA: Diagnosis not present

## 2023-11-21 DIAGNOSIS — I872 Venous insufficiency (chronic) (peripheral): Secondary | ICD-10-CM | POA: Diagnosis not present

## 2023-11-21 DIAGNOSIS — K219 Gastro-esophageal reflux disease without esophagitis: Secondary | ICD-10-CM | POA: Diagnosis not present

## 2023-11-21 DIAGNOSIS — D631 Anemia in chronic kidney disease: Secondary | ICD-10-CM | POA: Diagnosis not present

## 2023-11-25 ENCOUNTER — Inpatient Hospital Stay (HOSPITAL_COMMUNITY)
Admission: EM | Admit: 2023-11-25 | Discharge: 2023-12-20 | DRG: 919 | Disposition: E | Attending: Critical Care Medicine | Admitting: Critical Care Medicine

## 2023-11-25 ENCOUNTER — Emergency Department (HOSPITAL_COMMUNITY)

## 2023-11-25 ENCOUNTER — Other Ambulatory Visit: Payer: Self-pay

## 2023-11-25 DIAGNOSIS — E039 Hypothyroidism, unspecified: Secondary | ICD-10-CM | POA: Diagnosis present

## 2023-11-25 DIAGNOSIS — M79602 Pain in left arm: Secondary | ICD-10-CM | POA: Diagnosis not present

## 2023-11-25 DIAGNOSIS — A419 Sepsis, unspecified organism: Secondary | ICD-10-CM | POA: Diagnosis not present

## 2023-11-25 DIAGNOSIS — Z66 Do not resuscitate: Secondary | ICD-10-CM | POA: Diagnosis present

## 2023-11-25 DIAGNOSIS — K5730 Diverticulosis of large intestine without perforation or abscess without bleeding: Secondary | ICD-10-CM | POA: Diagnosis not present

## 2023-11-25 DIAGNOSIS — Z882 Allergy status to sulfonamides status: Secondary | ICD-10-CM

## 2023-11-25 DIAGNOSIS — I951 Orthostatic hypotension: Secondary | ICD-10-CM | POA: Diagnosis present

## 2023-11-25 DIAGNOSIS — Z79899 Other long term (current) drug therapy: Secondary | ICD-10-CM

## 2023-11-25 DIAGNOSIS — E7849 Other hyperlipidemia: Secondary | ICD-10-CM | POA: Diagnosis present

## 2023-11-25 DIAGNOSIS — Z515 Encounter for palliative care: Secondary | ICD-10-CM | POA: Diagnosis not present

## 2023-11-25 DIAGNOSIS — M869 Osteomyelitis, unspecified: Secondary | ICD-10-CM

## 2023-11-25 DIAGNOSIS — N1832 Chronic kidney disease, stage 3b: Secondary | ICD-10-CM | POA: Diagnosis not present

## 2023-11-25 DIAGNOSIS — N189 Chronic kidney disease, unspecified: Secondary | ICD-10-CM | POA: Diagnosis not present

## 2023-11-25 DIAGNOSIS — Z792 Long term (current) use of antibiotics: Secondary | ICD-10-CM

## 2023-11-25 DIAGNOSIS — R195 Other fecal abnormalities: Secondary | ICD-10-CM | POA: Diagnosis not present

## 2023-11-25 DIAGNOSIS — E861 Hypovolemia: Secondary | ICD-10-CM | POA: Diagnosis present

## 2023-11-25 DIAGNOSIS — E43 Unspecified severe protein-calorie malnutrition: Secondary | ICD-10-CM | POA: Diagnosis present

## 2023-11-25 DIAGNOSIS — F331 Major depressive disorder, recurrent, moderate: Secondary | ICD-10-CM | POA: Diagnosis present

## 2023-11-25 DIAGNOSIS — T8579XA Infection and inflammatory reaction due to other internal prosthetic devices, implants and grafts, initial encounter: Secondary | ICD-10-CM | POA: Diagnosis not present

## 2023-11-25 DIAGNOSIS — R531 Weakness: Secondary | ICD-10-CM | POA: Diagnosis not present

## 2023-11-25 DIAGNOSIS — D649 Anemia, unspecified: Secondary | ICD-10-CM | POA: Diagnosis not present

## 2023-11-25 DIAGNOSIS — I480 Paroxysmal atrial fibrillation: Secondary | ICD-10-CM | POA: Diagnosis present

## 2023-11-25 DIAGNOSIS — T847XXA Infection and inflammatory reaction due to other internal orthopedic prosthetic devices, implants and grafts, initial encounter: Secondary | ICD-10-CM | POA: Diagnosis not present

## 2023-11-25 DIAGNOSIS — R1310 Dysphagia, unspecified: Secondary | ICD-10-CM | POA: Diagnosis not present

## 2023-11-25 DIAGNOSIS — M7989 Other specified soft tissue disorders: Secondary | ICD-10-CM | POA: Diagnosis not present

## 2023-11-25 DIAGNOSIS — B961 Klebsiella pneumoniae [K. pneumoniae] as the cause of diseases classified elsewhere: Secondary | ICD-10-CM | POA: Diagnosis present

## 2023-11-25 DIAGNOSIS — R451 Restlessness and agitation: Secondary | ICD-10-CM | POA: Diagnosis present

## 2023-11-25 DIAGNOSIS — I739 Peripheral vascular disease, unspecified: Secondary | ICD-10-CM | POA: Diagnosis not present

## 2023-11-25 DIAGNOSIS — E1149 Type 2 diabetes mellitus with other diabetic neurological complication: Secondary | ICD-10-CM | POA: Diagnosis present

## 2023-11-25 DIAGNOSIS — E1169 Type 2 diabetes mellitus with other specified complication: Secondary | ICD-10-CM | POA: Diagnosis present

## 2023-11-25 DIAGNOSIS — Z981 Arthrodesis status: Secondary | ICD-10-CM

## 2023-11-25 DIAGNOSIS — R5383 Other fatigue: Secondary | ICD-10-CM | POA: Diagnosis not present

## 2023-11-25 DIAGNOSIS — N179 Acute kidney failure, unspecified: Secondary | ICD-10-CM | POA: Diagnosis not present

## 2023-11-25 DIAGNOSIS — M868X6 Other osteomyelitis, lower leg: Secondary | ICD-10-CM | POA: Diagnosis present

## 2023-11-25 DIAGNOSIS — E1151 Type 2 diabetes mellitus with diabetic peripheral angiopathy without gangrene: Secondary | ICD-10-CM | POA: Diagnosis present

## 2023-11-25 DIAGNOSIS — I130 Hypertensive heart and chronic kidney disease with heart failure and stage 1 through stage 4 chronic kidney disease, or unspecified chronic kidney disease: Secondary | ICD-10-CM | POA: Diagnosis not present

## 2023-11-25 DIAGNOSIS — Z1152 Encounter for screening for COVID-19: Secondary | ICD-10-CM | POA: Diagnosis not present

## 2023-11-25 DIAGNOSIS — I5032 Chronic diastolic (congestive) heart failure: Secondary | ICD-10-CM | POA: Diagnosis present

## 2023-11-25 DIAGNOSIS — E876 Hypokalemia: Secondary | ICD-10-CM | POA: Diagnosis not present

## 2023-11-25 DIAGNOSIS — Z955 Presence of coronary angioplasty implant and graft: Secondary | ICD-10-CM

## 2023-11-25 DIAGNOSIS — Z79890 Hormone replacement therapy (postmenopausal): Secondary | ICD-10-CM

## 2023-11-25 DIAGNOSIS — Z7901 Long term (current) use of anticoagulants: Secondary | ICD-10-CM

## 2023-11-25 DIAGNOSIS — Z87891 Personal history of nicotine dependence: Secondary | ICD-10-CM

## 2023-11-25 DIAGNOSIS — Z6824 Body mass index (BMI) 24.0-24.9, adult: Secondary | ICD-10-CM

## 2023-11-25 DIAGNOSIS — J449 Chronic obstructive pulmonary disease, unspecified: Secondary | ICD-10-CM | POA: Diagnosis not present

## 2023-11-25 DIAGNOSIS — N400 Enlarged prostate without lower urinary tract symptoms: Secondary | ICD-10-CM | POA: Diagnosis present

## 2023-11-25 DIAGNOSIS — R197 Diarrhea, unspecified: Secondary | ICD-10-CM | POA: Diagnosis not present

## 2023-11-25 DIAGNOSIS — D696 Thrombocytopenia, unspecified: Secondary | ICD-10-CM

## 2023-11-25 DIAGNOSIS — Z860100 Personal history of colon polyps, unspecified: Secondary | ICD-10-CM | POA: Diagnosis not present

## 2023-11-25 DIAGNOSIS — Y828 Other medical devices associated with adverse incidents: Secondary | ICD-10-CM | POA: Diagnosis present

## 2023-11-25 DIAGNOSIS — E1122 Type 2 diabetes mellitus with diabetic chronic kidney disease: Secondary | ICD-10-CM | POA: Diagnosis present

## 2023-11-25 DIAGNOSIS — Z7985 Long-term (current) use of injectable non-insulin antidiabetic drugs: Secondary | ICD-10-CM

## 2023-11-25 DIAGNOSIS — Z7401 Bed confinement status: Secondary | ICD-10-CM

## 2023-11-25 DIAGNOSIS — I4891 Unspecified atrial fibrillation: Secondary | ICD-10-CM | POA: Diagnosis not present

## 2023-11-25 DIAGNOSIS — D509 Iron deficiency anemia, unspecified: Secondary | ICD-10-CM | POA: Diagnosis not present

## 2023-11-25 DIAGNOSIS — I2582 Chronic total occlusion of coronary artery: Secondary | ICD-10-CM | POA: Diagnosis present

## 2023-11-25 DIAGNOSIS — G9341 Metabolic encephalopathy: Secondary | ICD-10-CM | POA: Diagnosis not present

## 2023-11-25 DIAGNOSIS — Z9861 Coronary angioplasty status: Secondary | ICD-10-CM

## 2023-11-25 DIAGNOSIS — R6521 Severe sepsis with septic shock: Secondary | ICD-10-CM | POA: Diagnosis present

## 2023-11-25 DIAGNOSIS — R627 Adult failure to thrive: Secondary | ICD-10-CM | POA: Diagnosis present

## 2023-11-25 DIAGNOSIS — I272 Pulmonary hypertension, unspecified: Secondary | ICD-10-CM | POA: Diagnosis not present

## 2023-11-25 DIAGNOSIS — I2510 Atherosclerotic heart disease of native coronary artery without angina pectoris: Secondary | ICD-10-CM | POA: Diagnosis present

## 2023-11-25 DIAGNOSIS — E538 Deficiency of other specified B group vitamins: Secondary | ICD-10-CM | POA: Diagnosis not present

## 2023-11-25 DIAGNOSIS — I129 Hypertensive chronic kidney disease with stage 1 through stage 4 chronic kidney disease, or unspecified chronic kidney disease: Secondary | ICD-10-CM | POA: Diagnosis not present

## 2023-11-25 DIAGNOSIS — L89153 Pressure ulcer of sacral region, stage 3: Secondary | ICD-10-CM | POA: Diagnosis not present

## 2023-11-25 DIAGNOSIS — Z794 Long term (current) use of insulin: Secondary | ICD-10-CM

## 2023-11-25 DIAGNOSIS — A4159 Other Gram-negative sepsis: Secondary | ICD-10-CM | POA: Diagnosis present

## 2023-11-25 DIAGNOSIS — G4733 Obstructive sleep apnea (adult) (pediatric): Secondary | ICD-10-CM | POA: Diagnosis present

## 2023-11-25 DIAGNOSIS — R571 Hypovolemic shock: Secondary | ICD-10-CM | POA: Diagnosis present

## 2023-11-25 DIAGNOSIS — E8721 Acute metabolic acidosis: Secondary | ICD-10-CM | POA: Diagnosis present

## 2023-11-25 DIAGNOSIS — D6959 Other secondary thrombocytopenia: Secondary | ICD-10-CM | POA: Diagnosis present

## 2023-11-25 DIAGNOSIS — E8809 Other disorders of plasma-protein metabolism, not elsewhere classified: Secondary | ICD-10-CM | POA: Diagnosis present

## 2023-11-25 DIAGNOSIS — D689 Coagulation defect, unspecified: Secondary | ICD-10-CM | POA: Diagnosis not present

## 2023-11-25 DIAGNOSIS — F514 Sleep terrors [night terrors]: Secondary | ICD-10-CM | POA: Diagnosis present

## 2023-11-25 DIAGNOSIS — R609 Edema, unspecified: Secondary | ICD-10-CM | POA: Diagnosis not present

## 2023-11-25 DIAGNOSIS — F419 Anxiety disorder, unspecified: Secondary | ICD-10-CM | POA: Diagnosis present

## 2023-11-25 DIAGNOSIS — E871 Hypo-osmolality and hyponatremia: Secondary | ICD-10-CM | POA: Diagnosis present

## 2023-11-25 DIAGNOSIS — R54 Age-related physical debility: Secondary | ICD-10-CM | POA: Diagnosis present

## 2023-11-25 DIAGNOSIS — G8929 Other chronic pain: Secondary | ICD-10-CM | POA: Diagnosis present

## 2023-11-25 DIAGNOSIS — J9601 Acute respiratory failure with hypoxia: Secondary | ICD-10-CM | POA: Diagnosis not present

## 2023-11-25 DIAGNOSIS — R739 Hyperglycemia, unspecified: Secondary | ICD-10-CM | POA: Diagnosis not present

## 2023-11-25 LAB — I-STAT CHEM 8, ED
BUN: 33 mg/dL — ABNORMAL HIGH (ref 8–23)
Calcium, Ion: 1.07 mmol/L — ABNORMAL LOW (ref 1.15–1.40)
Chloride: 106 mmol/L (ref 98–111)
Creatinine, Ser: 2.6 mg/dL — ABNORMAL HIGH (ref 0.61–1.24)
Glucose, Bld: 110 mg/dL — ABNORMAL HIGH (ref 70–99)
HCT: 18 % — ABNORMAL LOW (ref 39.0–52.0)
Hemoglobin: 6.1 g/dL — CL (ref 13.0–17.0)
Potassium: 3.3 mmol/L — ABNORMAL LOW (ref 3.5–5.1)
Sodium: 139 mmol/L (ref 135–145)
TCO2: 17 mmol/L — ABNORMAL LOW (ref 22–32)

## 2023-11-25 LAB — CBC
HCT: 18.7 % — ABNORMAL LOW (ref 39.0–52.0)
Hemoglobin: 6.3 g/dL — CL (ref 13.0–17.0)
MCH: 29.9 pg (ref 26.0–34.0)
MCHC: 33.7 g/dL (ref 30.0–36.0)
MCV: 88.6 fL (ref 80.0–100.0)
Platelets: 46 K/uL — ABNORMAL LOW (ref 150–400)
RBC: 2.11 MIL/uL — ABNORMAL LOW (ref 4.22–5.81)
RDW: 15.6 % — ABNORMAL HIGH (ref 11.5–15.5)
WBC: 8 K/uL (ref 4.0–10.5)
nRBC: 0 % (ref 0.0–0.2)

## 2023-11-25 LAB — COMPREHENSIVE METABOLIC PANEL WITH GFR
ALT: 8 U/L (ref 0–44)
AST: 16 U/L (ref 15–41)
Albumin: 1.7 g/dL — ABNORMAL LOW (ref 3.5–5.0)
Alkaline Phosphatase: 83 U/L (ref 38–126)
Anion gap: 15 (ref 5–15)
BUN: 40 mg/dL — ABNORMAL HIGH (ref 8–23)
CO2: 17 mmol/L — ABNORMAL LOW (ref 22–32)
Calcium: 7.5 mg/dL — ABNORMAL LOW (ref 8.9–10.3)
Chloride: 105 mmol/L (ref 98–111)
Creatinine, Ser: 2.56 mg/dL — ABNORMAL HIGH (ref 0.61–1.24)
GFR, Estimated: 26 mL/min — ABNORMAL LOW (ref >60)
Glucose, Bld: 121 mg/dL — ABNORMAL HIGH (ref 70–99)
Potassium: 3.3 mmol/L — ABNORMAL LOW (ref 3.5–5.1)
Sodium: 137 mmol/L (ref 135–145)
Total Bilirubin: 1.3 mg/dL — ABNORMAL HIGH (ref 0.0–1.2)
Total Protein: 4.3 g/dL — ABNORMAL LOW (ref 6.5–8.1)

## 2023-11-25 LAB — FERRITIN: Ferritin: 446 ng/mL — ABNORMAL HIGH (ref 24–336)

## 2023-11-25 LAB — URINALYSIS, W/ REFLEX TO CULTURE (INFECTION SUSPECTED)
Bilirubin Urine: NEGATIVE
Glucose, UA: NEGATIVE mg/dL
Hgb urine dipstick: NEGATIVE
Ketones, ur: NEGATIVE mg/dL
Nitrite: NEGATIVE
Protein, ur: 100 mg/dL — AB
Specific Gravity, Urine: 1.013 (ref 1.005–1.030)
pH: 5 (ref 5.0–8.0)

## 2023-11-25 LAB — IRON AND TIBC: Iron: 65 ug/dL (ref 45–182)

## 2023-11-25 LAB — PREPARE RBC (CROSSMATCH)

## 2023-11-25 LAB — RETICULOCYTES
Immature Retic Fract: 4.2 % (ref 2.3–15.9)
RBC.: 2.2 MIL/uL — ABNORMAL LOW (ref 4.22–5.81)
Retic Count, Absolute: 16.1 K/uL — ABNORMAL LOW (ref 19.0–186.0)
Retic Ct Pct: 0.7 % (ref 0.4–3.1)

## 2023-11-25 LAB — VITAMIN B12: Vitamin B-12: 487 pg/mL (ref 180–914)

## 2023-11-25 LAB — MAGNESIUM: Magnesium: 1.5 mg/dL — ABNORMAL LOW (ref 1.7–2.4)

## 2023-11-25 LAB — RESP PANEL BY RT-PCR (RSV, FLU A&B, COVID)  RVPGX2
Influenza A by PCR: NEGATIVE
Influenza B by PCR: NEGATIVE
Resp Syncytial Virus by PCR: NEGATIVE
SARS Coronavirus 2 by RT PCR: NEGATIVE

## 2023-11-25 LAB — FOLATE: Folate: 4.4 ng/mL — ABNORMAL LOW (ref >5.9)

## 2023-11-25 LAB — POC OCCULT BLOOD, ED: Fecal Occult Bld: POSITIVE — AB

## 2023-11-25 LAB — I-STAT CG4 LACTIC ACID, ED: Lactic Acid, Venous: 1.9 mmol/L (ref 0.5–1.9)

## 2023-11-25 MED ORDER — SODIUM CHLORIDE 0.9 % IV SOLN
2.0000 g | Freq: Once | INTRAVENOUS | Status: AC
  Administered 2023-11-25: 2 g via INTRAVENOUS
  Filled 2023-11-25: qty 12.5

## 2023-11-25 MED ORDER — METRONIDAZOLE 500 MG/100ML IV SOLN
500.0000 mg | Freq: Once | INTRAVENOUS | Status: AC
  Administered 2023-11-25: 500 mg via INTRAVENOUS
  Filled 2023-11-25: qty 100

## 2023-11-25 MED ORDER — SODIUM CHLORIDE 0.9% IV SOLUTION: Freq: Once | INTRAVENOUS | Status: AC

## 2023-11-25 MED ORDER — LACTATED RINGERS IV BOLUS (SEPSIS)
1000.0000 mL | Freq: Once | INTRAVENOUS | Status: AC
  Administered 2023-11-25: 1000 mL via INTRAVENOUS

## 2023-11-25 MED ORDER — VANCOMYCIN HCL IN DEXTROSE 1-5 GM/200ML-% IV SOLN
1000.0000 mg | Freq: Once | INTRAVENOUS | Status: AC
  Administered 2023-11-25: 1000 mg via INTRAVENOUS
  Filled 2023-11-25: qty 200

## 2023-11-25 MED ORDER — LACTATED RINGERS IV SOLN: INTRAVENOUS | Status: DC

## 2023-11-25 NOTE — ED Triage Notes (Signed)
 Pt bib [facility] coming from home with CC of generalized weakness. Last week, patient has had increase in AMS. Pt A&Ox3 but A&Ox4 at baseline. Blood work was done last week with hemoglobin of 5 but patient did not get treatment for this. Pt has swelling to left arm. GCS 14.   EMS VS: 96/63 74 HR 15 etco2  14 RR 127 cbg

## 2023-11-25 NOTE — Sepsis Progress Note (Signed)
 Following for sepsis monitoring ?

## 2023-11-25 NOTE — ED Provider Notes (Addendum)
 [place] EMERGENCY DEPARTMENT AT [facility] Provider Note   CSN: [id number] Arrival date & time: 11/25/23  2052     Patient presents with: Weakness   Harold [name] is a 68 y.o. male.   Pt with generalized weakness, failure to thrive in past couple weeks. Has been bed bound, not eating/drinking well, getting weaker and weaker. No trauma/fall. No fever or chills. Was told has chronic infection ?osteo to RLE and that amputation was advised but pt had declined. Currently on doxycycline . Had recent outpatient labs w hgb 5, but did not want to come to hospital then. Denies acute blood loss or melena. Hx chronic anemia. Ems notes bp 96/63.  Pt limited historian - level 5 caveat.   The history is provided by the patient, the EMS personnel, medical records and a significant other. The history is limited by the condition of the patient.  Weakness Associated symptoms: no abdominal pain, no chest pain, no dysuria, no fever, no headaches, no shortness of breath and no vomiting        Prior to Admission medications   Medication Sig Start Date End Date Taking? Authorizing Provider  allopurinol  (ZYLOPRIM ) 100 MG tablet Take 0.5 tablets (50 mg total) by mouth daily. 05/10/23   Vernon Ranks, MD  amiodarone  (PACERONE ) 200 MG tablet Take 1 tablet (200 mg total) by mouth 2 (two) times daily. Patient not taking: No sig reported 05/10/23   Vernon Ranks, MD  apixaban  (ELIQUIS ) 5 MG TABS tablet TAKE 1 TABLET BY MOUTH TWICE A DAY 02/17/23   Anner Alm ORN, MD  atorvastatin  (LIPITOR) 40 MG tablet Take 1 tablet (40 mg total) by mouth daily. 05/10/23 03/02/24  Vernon Ranks, MD  clopidogrel  (PLAVIX ) 75 MG tablet Take 75 mg by mouth every morning. 02/04/23   [provider]  doxycycline  (VIBRA -TABS) 100 MG tablet Take 1 tablet (100 mg total) by mouth 2 (two) times daily. 10/15/23   Kuppelweiser, Cassie L, RPH-CPP  DULoxetine  (CYMBALTA ) 60 MG capsule Take 60 mg by mouth daily.     [provider]  ezetimibe  (ZETIA ) 10 MG tablet Take 10 mg by mouth every evening. 05/09/22   [provider]  feeding supplement (ENSURE PLUS HIGH PROTEIN) LIQD Take 237 mLs by mouth 2 (two) times daily between meals. 08/09/23   Ezenduka, Nkeiruka J, MD  ferrous sulfate  325 (65 FE) MG tablet Take 325 mg by mouth 3 (three) times a week. Monday, Wednesday, Friday 06/08/23   [provider]  gabapentin  (NEURONTIN ) 300 MG capsule Take 1 capsule (300 mg total) by mouth at bedtime. 05/10/23   Vernon Ranks, MD  HYDROcodone -acetaminophen  (NORCO/VICODIN) 5-325 MG tablet Take 1 tablet by mouth every 6 (six) hours as needed for moderate pain (pain score 4-6). 09/22/23   Charmayne Holmes, DO  hydrocortisone  cream 1 % Apply topically 3 (three) times daily. 09/19/23   Charmayne Holmes, DO  insulin  glargine (LANTUS  SOLOSTAR) 100 UNIT/ML Solostar Pen Inject 8 Units into the skin at bedtime. 09/22/23   Charmayne Holmes, DO  insulin  lispro (HUMALOG) 100 UNIT/ML injection Inject 0-15 Units into the skin in the morning, at noon, in the evening, and at bedtime. Per sliding scale: 0-70= 0 units if <70 implement hypoglycemic protocol & notify MD/NP 71-150= 0 units 151-200=3 units 201-250= 6 units 251-300= 9 units 301-350= 12 units 351-400= 15 units 401-999 if CBG>400. Notify MD/ NP,    [provider]  levofloxacin  (LEVAQUIN ) 750 MG tablet Take 1 tablet (750 mg  total) by mouth daily. 10/15/23   Kuppelweiser, Cassie L, RPH-CPP  levothyroxine  (SYNTHROID , LEVOTHROID) 88 MCG tablet Take 88 mcg by mouth daily before breakfast.    [provider]  losartan  (COZAAR ) 25 MG tablet Take 25 mg by mouth daily.    [provider]  midodrine  (PROAMATINE ) 5 MG tablet Take 5 mg by mouth every 8 (eight) hours as needed (SBP < 110).    [provider]  nitroGLYCERIN  (NITROSTAT ) 0.4 MG SL tablet PLACE 1 TAB UNDER THE TONGUE EVERY 5 MINUTES AS NEEDED FOR CHEST PAIN (SEVERE PRESSURE OR  TIGHTNESS) 02/22/19   Anner Alm ORN, MD  ondansetron  (ZOFRAN ) 4 MG tablet Take 1 tablet (4 mg total) by mouth every 8 (eight) hours as needed for nausea or vomiting. 09/22/23   Charmayne Holmes, DO  polyethylene glycol (MIRALAX  / GLYCOLAX ) 17 g packet Take 17 g by mouth daily as needed for mild constipation. 06/28/23   Rosario Leatrice FERNS, MD  tamsulosin  (FLOMAX ) 0.4 MG CAPS capsule Take 0.4 mg by mouth.    [provider]  tirzepatide  (MOUNJARO ) 15 MG/0.5ML Pen Inject 15 mg into the skin once a week. 12/30/22     traMADol (ULTRAM) 50 MG tablet Take 50 mg by mouth every 6 (six) hours as needed for moderate pain (pain score 4-6).    [provider]  Vitamin D , Ergocalciferol , (DRISDOL ) 1.25 MG (50000 UNIT) CAPS capsule Take 1 capsule (50,000 Units total) by mouth every 7 (seven) days. 05/27/23   Lue Elsie BROCKS, MD    Allergies: Beta-lactamase inhibitors (beta-lactam), Other, Wellbutrin [bupropion], Metformin  and related, Sulfa antibiotics, and Tetracyclines & related    Review of Systems  Constitutional:  Positive for appetite change. Negative for fever.  HENT:  Negative for sore throat.   Respiratory:  Negative for shortness of breath.   Cardiovascular:  Negative for chest pain.  Gastrointestinal:  Negative for abdominal pain and vomiting.  Genitourinary:  Negative for dysuria and flank pain.  Musculoskeletal:  Negative for neck pain and neck stiffness.  Skin:  Negative for rash.  Neurological:  Positive for weakness. Negative for headaches.    Updated Vital Signs BP 114/70   Pulse (!) 47   Temp 98.3 F (36.8 C) (Axillary)   Resp 16   SpO2 100%   Physical Exam Vitals and nursing note reviewed.  Constitutional:      Appearance: He is well-developed.     Comments: Very pale, weak appearing.   HENT:     Head: Atraumatic.     Nose: Nose normal.     Mouth/Throat:     Mouth: Mucous membranes are dry.     Pharynx: Oropharynx is clear. No oropharyngeal exudate.   Eyes:     General: No scleral icterus.    Conjunctiva/sclera: Conjunctivae normal.     Pupils: Pupils are equal, round, and reactive to light.  Neck:     Trachea: No tracheal deviation.     Comments: Trachea midline, thyroid  not grossly enlarged or tender. No neck stiffness or rigidity.  Cardiovascular:     Rate and Rhythm: Normal rate and regular rhythm.     Pulses: Normal pulses.     Heart sounds: Normal heart sounds. No murmur heard.    No friction rub. No gallop.  Pulmonary:     Effort: Pulmonary effort is normal. No accessory muscle usage or respiratory distress.     Breath sounds: Normal breath sounds.  Abdominal:     General: Bowel sounds are normal.  There is no distension.     Palpations: Abdomen is soft.     Tenderness: There is no abdominal tenderness. There is no guarding.  Genitourinary:    Comments: No cva tenderness.mild excoriation to head/shaft of penis without acute infection. Dark brown stool, sent for hemoccult. Minimal superficial sacral area skin excoriation/erythema. Musculoskeletal:        General: No swelling.     Cervical back: Normal range of motion and neck supple. No rigidity.     Comments: CTLS spine, non tender, aligned, no step off. Chronic non healing wounds to bilateral lower legs - see photos, otherwise good passive rom bil extremities without pain or focal bony tenderness. Swelling to LUE. Compartments of bil extremities incl LUE are soft, not tense, no severe swelling. Distal pulses are palp/dopplerable bil ext.   Skin:    General: Skin is warm and dry.     Findings: No rash.  Neurological:     Mental Status: He is alert.     Comments: Weak/frail. Answers questions briefly. Motor/sens grossly intact bil.      (all labs ordered are listed, but only abnormal results are displayed) Results for orders placed or performed during the hospital encounter of 11/25/23  Comprehensive metabolic panel with GFR   Collection Time: 11/25/23  8:57 PM   Result Value Ref Range   Sodium 137 135 - 145 mmol/L   Potassium 3.3 (L) 3.5 - 5.1 mmol/L   Chloride 105 98 - 111 mmol/L   CO2 17 (L) 22 - 32 mmol/L   Glucose, Bld 121 (H) 70 - 99 mg/dL   BUN 40 (H) 8 - 23 mg/dL   Creatinine, Ser 7.43 (H) 0.61 - 1.24 mg/dL   Calcium  7.5 (L) 8.9 - 10.3 mg/dL   Total Protein 4.3 (L) 6.5 - 8.1 g/dL   Albumin  1.7 (L) 3.5 - 5.0 g/dL   AST 16 15 - 41 U/L   ALT 8 0 - 44 U/L   Alkaline Phosphatase 83 38 - 126 U/L   Total Bilirubin 1.3 (H) 0.0 - 1.2 mg/dL   GFR, Estimated 26 (L) >60 mL/min   Anion gap 15 5 - 15  CBC   Collection Time: 11/25/23  8:57 PM  Result Value Ref Range   WBC 8.0 4.0 - 10.5 K/uL   RBC 2.11 (L) 4.22 - 5.81 MIL/uL   Hemoglobin 6.3 (LL) 13.0 - 17.0 g/dL   HCT 81.2 (L) 60.9 - 47.9 %   MCV 88.6 80.0 - 100.0 fL   MCH 29.9 26.0 - 34.0 pg   MCHC 33.7 30.0 - 36.0 g/dL   RDW 84.3 (H) 88.4 - 84.4 %   Platelets 46 (L) 150 - 400 K/uL   nRBC 0.0 0.0 - 0.2 %  Reticulocytes   Collection Time: 11/25/23  8:58 PM  Result Value Ref Range   Retic Ct Pct 0.7 0.4 - 3.1 %   RBC. 2.20 (L) 4.22 - 5.81 MIL/uL   Retic Count, Absolute 16.1 (L) 19.0 - 186.0 K/uL   Immature Retic Fract 4.2 2.3 - 15.9 %  Type and screen   Collection Time: 11/25/23  9:25 PM  Result Value Ref Range   ABO/RH(D) A POS    Antibody Screen NEG    Sample Expiration      12/08/23,2359 Performed at [facility], [address], [place], [place] [address]    Unit Number [id number]    Blood Component Type RED CELLS,LR    Unit division 00  Status of Unit ALLOCATED    Transfusion Status OK TO TRANSFUSE    Crossmatch Result Compatible    Unit Number [id number]    Blood Component Type RED CELLS,LR    Unit division 00    Status of Unit ALLOCATED    Transfusion Status OK TO TRANSFUSE    Crossmatch Result Compatible   BPAM RBC   Collection Time: 11/25/23  9:25 PM  Result Value Ref Range   Blood Product Unit Number [id number]    PRODUCT CODE E0382V00     Unit Type and Rh 6200    Blood Product Expiration Date 797488947640    Blood Product Unit Number [id number]    PRODUCT CODE E0382V00    Unit Type and Rh 6200    Blood Product Expiration Date 797488947640   I-Stat Lactic Acid, ED   Collection Time: 11/25/23  9:32 PM  Result Value Ref Range   Lactic Acid, Venous 1.9 0.5 - 1.9 mmol/L  I-stat chem 8, ED   Collection Time: 11/25/23  9:32 PM  Result Value Ref Range   Sodium 139 135 - 145 mmol/L   Potassium 3.3 (L) 3.5 - 5.1 mmol/L   Chloride 106 98 - 111 mmol/L   BUN 33 (H) 8 - 23 mg/dL   Creatinine, Ser 7.39 (H) 0.61 - 1.24 mg/dL   Glucose, Bld 889 (H) 70 - 99 mg/dL   Calcium , Ion 1.07 (L) 1.15 - 1.40 mmol/L   TCO2 17 (L) 22 - 32 mmol/L   Hemoglobin 6.1 (LL) 13.0 - 17.0 g/dL   HCT 81.9 (L) 60.9 - 47.9 %   Comment NOTIFIED PHYSICIAN   POC occult blood, ED Provider will collect   Collection Time: 11/25/23  9:34 PM  Result Value Ref Range   Fecal Occult Bld POSITIVE (A) NEGATIVE  Prepare RBC (crossmatch)   Collection Time: 11/25/23 10:30 PM  Result Value Ref Range   Order Confirmation      ORDER PROCESSED BY BLOOD BANK Performed at [facility], [address] [place], [place] [address]    *Note: Due to a large number of results and/or encounters for the requested time period, some results have not been displayed. A complete set of results can be found in Results Review.    EKG: EKG Interpretation Date/Time:  Tuesday November 25 2023 21:10:37 EDT Ventricular Rate:  75 PR Interval:  69 QRS Duration:  129 QT Interval:  526 QTC Calculation: 588 R Axis:   58  Text Interpretation: Sinus rhythm Non-specific intra-ventricular conduction block Non-specific ST-t changes Baseline wander Confirmed by Bernard Drivers ([id number]) on 11/25/2023 9:19:39 PM  Radiology: CT Head Wo Contrast Result Date: 11/25/2023 EXAM: CT HEAD WITHOUT CONTRAST 11/25/2023 10:05:33 PM TECHNIQUE: CT of the head was performed without the administration of  intravenous contrast. Automated exposure control, iterative reconstruction, and/or weight based adjustment of the mA/kV was utilized to reduce the radiation dose to as low as reasonably achievable. COMPARISON: None available. CLINICAL HISTORY: Mental status change, unknown cause. FINDINGS: BRAIN AND VENTRICLES: Moderate global parenchymal volume loss is slightly advanced in relation to the patient's stated age. Mild periventricular white matter changes are present, likely reflecting small vessel ischemia. Ventricular size is normal and commensurate with a degree of parenchymal volume loss. No acute hemorrhage. No evidence of acute infarct. No hydrocephalus. No extra-axial collection. No mass effect or midline shift. ORBITS: No acute abnormality. SINUSES: No acute abnormality. SOFT TISSUES AND SKULL: No acute soft tissue abnormality. No skull fracture. IMPRESSION: 1. No  acute intracranial abnormality. 2. Moderate global parenchymal volume loss, slightly advanced for age. Electronically signed by: Dorethia Molt MD 11/25/2023 10:10 PM EDT RP Workstation: [id number]   DG Chest Port 1 View Result Date: 11/25/2023 EXAM: 1 VIEW XRAY OF THE CHEST 11/25/2023 09:28:00 PM COMPARISON: None available. CLINICAL HISTORY: weakness. Pt bib [facility] coming from home with CC of generalized weakness. Last week, patient has had increase in AMS. Pt A\T\Ox3 but A\T\Ox4 at baseline. Blood work was done last week with hemoglobin of 5 but patient did not get treatment for this. Pt has swelling to ; left arm. GCS 14. weakness. Pt bib [facility] coming from home with CC of generalized weakness. Last week, patient has had increase in AMS. Pt A\T\Ox3 but A\T\Ox4 at baseline. Blood work was done last week with hemoglobin of 5 but patient did not get treatment for this. Pt has swelling to ; left arm. GCS 14. FINDINGS: LUNGS AND PLEURA: Pulmonary hyperinflation. No pulmonary opacity. No pleural effusion. No pneumothorax. HEART AND MEDIASTINUM: Cardiac  size within normal limits. BONES AND SOFT TISSUES: No acute bone abnormality. Bony vascularity is normal. IMPRESSION: 1. No acute process. 2. Pulmonary hyperinflation. Electronically signed by: Dorethia Molt MD 11/25/2023 09:32 PM EDT RP Workstation: [id number]     Procedures   Medications Ordered in the ED  lactated ringers  infusion ( Intravenous New Bag/Given 11/25/23 2143)  lactated ringers  bolus 1,000 mL (1,000 mLs Intravenous New Bag/Given 11/25/23 2143)  metroNIDAZOLE  (FLAGYL ) IVPB 500 mg (500 mg Intravenous New Bag/Given 11/25/23 2226)  vancomycin  (VANCOCIN ) IVPB 1000 mg/200 mL premix (has no administration in time range)  0.9 %  sodium chloride  infusion (Manually program via Guardrails IV Fluids) (has no administration in time range)  ceFEPIme  (MAXIPIME ) 2 g in sodium chloride  0.9 % 100 mL IVPB (0 g Intravenous Stopped 11/25/23 2214)                                    Medical Decision Making Problems Addressed: AKI (acute kidney injury): acute illness or injury with systemic symptoms that poses a threat to life or bodily functions Chronic anemia: chronic illness or injury with exacerbation, progression, or side effects of treatment that poses a threat to life or bodily functions Failure to thrive in adult: chronic illness or injury with exacerbation, progression, or side effects of treatment that poses a threat to life or bodily functions Generalized weakness: acute illness or injury with systemic symptoms that poses a threat to life or bodily functions Heme positive stool: acute illness or injury Hypoalbuminemia: chronic illness or injury with exacerbation, progression, or side effects of treatment that poses a threat to life or bodily functions Hypotension due to hypovolemia: acute illness or injury with systemic symptoms that poses a threat to life or bodily functions Stage 3b chronic kidney disease (HCC): chronic illness or injury with exacerbation, progression, or side effects of  treatment that poses a threat to life or bodily functions Symptomatic anemia: acute illness or injury with systemic symptoms that poses a threat to life or bodily functions Thrombocytopenia: acute illness or injury  Amount and/or Complexity of Data Reviewed Independent Historian: EMS    Details: Ems, so, hx External Data Reviewed: notes. Labs: ordered. Decision-making details documented in ED Course. Radiology: ordered and independent interpretation performed. Decision-making details documented in ED Course. ECG/medicine tests: ordered and independent interpretation performed. Decision-making details documented in ED Course. Discussion of management or test interpretation  with external provider(s): medicine  Risk Prescription drug management. Decision regarding hospitalization.   Iv ns. Continuous pulse ox and cardiac monitoring. Labs ordered/sent. Imaging ordered.   Differential diagnosis includes sepsis, dehydration, aki, uti, osteo, etc. Dispo decision including potential need for admission considered - will get labs and imaging and reassess.   Reviewed nursing notes and prior charts for additional history. External reports reviewed. Additional history from: significant other and ems.   LR bolus. Cultures sent. Iv abx.   Cardiac monitor: sinus rhythm, rate 77.  Labs reviewed/interpreted by me - hgb 6. Aki on ckd. K mildly low. Mg added. Stool heme pos. Protonix  iv. (Pt reports not taking eliquis  in past couple days). Prbc transfusion emergently given. Plt low.   Xrays reviewed/interpreted by me - no pna.   Ct reviewed/interpreted by me - no hem.   Medicine consulted for admission.discussed pt with Dr Charlton - will admit.  Hypotension is improved/resolved.   Abd soft non tender.   Medicine to admit.   CRITICAL CARE RE: severe symptomatic anemia w hypotension requiring emergent prbc transfusion, FTT, AKI. Performed by: Tahlia Deamer E Eliz Nigg Total critical care time: 90  minutes Critical care time was exclusive of separately billable procedures and treating other patients. Critical care was necessary to treat or prevent imminent or life-threatening deterioration. Critical care was time spent personally by me on the following activities: development of treatment plan with patient and/or surrogate as well as nursing, discussions with consultants, evaluation of patient's response to treatment, examination of patient, obtaining history from patient or surrogate, ordering and performing treatments and interventions, ordering and review of laboratory studies, ordering and review of radiographic studies, pulse oximetry and re-evaluation of patient's condition.        Final diagnoses:  Generalized weakness  Symptomatic anemia  Heme positive stool  Hypotension due to hypovolemia  AKI (acute kidney injury)  Stage 3b chronic kidney disease (HCC)  Failure to thrive in adult  Thrombocytopenia  Chronic anemia  Hypoalbuminemia  Severe protein-calorie malnutrition    ED Discharge Orders     None            Bernard Drivers, MD 11/25/23 2243

## 2023-11-25 NOTE — ED Notes (Signed)
 Nurse and this nurse tech provided peri care, and changed patient's brief.

## 2023-11-25 NOTE — ED Notes (Signed)
 ED Provider at bedside.

## 2023-11-26 ENCOUNTER — Encounter (HOSPITAL_COMMUNITY): Payer: Self-pay | Admitting: Family Medicine

## 2023-11-26 ENCOUNTER — Ambulatory Visit: Admitting: Cardiovascular Disease

## 2023-11-26 DIAGNOSIS — A419 Sepsis, unspecified organism: Secondary | ICD-10-CM

## 2023-11-26 DIAGNOSIS — M868X6 Other osteomyelitis, lower leg: Secondary | ICD-10-CM

## 2023-11-26 DIAGNOSIS — G9341 Metabolic encephalopathy: Secondary | ICD-10-CM

## 2023-11-26 DIAGNOSIS — E861 Hypovolemia: Secondary | ICD-10-CM

## 2023-11-26 DIAGNOSIS — R6521 Severe sepsis with septic shock: Secondary | ICD-10-CM

## 2023-11-26 DIAGNOSIS — D649 Anemia, unspecified: Secondary | ICD-10-CM | POA: Diagnosis not present

## 2023-11-26 DIAGNOSIS — E43 Unspecified severe protein-calorie malnutrition: Secondary | ICD-10-CM

## 2023-11-26 DIAGNOSIS — R627 Adult failure to thrive: Secondary | ICD-10-CM

## 2023-11-26 DIAGNOSIS — D696 Thrombocytopenia, unspecified: Secondary | ICD-10-CM | POA: Diagnosis present

## 2023-11-26 LAB — BASIC METABOLIC PANEL WITH GFR
Anion gap: 17 — ABNORMAL HIGH (ref 5–15)
Anion gap: 13 (ref 5–15)
BUN: 43 mg/dL — ABNORMAL HIGH (ref 8–23)
BUN: 45 mg/dL — ABNORMAL HIGH (ref 8–23)
CO2: 14 mmol/L — ABNORMAL LOW (ref 22–32)
CO2: 19 mmol/L — ABNORMAL LOW (ref 22–32)
Calcium: 7.3 mg/dL — ABNORMAL LOW (ref 8.9–10.3)
Calcium: 7.5 mg/dL — ABNORMAL LOW (ref 8.9–10.3)
Chloride: 104 mmol/L (ref 98–111)
Chloride: 105 mmol/L (ref 98–111)
Creatinine, Ser: 2.67 mg/dL — ABNORMAL HIGH (ref 0.61–1.24)
Creatinine, Ser: 2.8 mg/dL — ABNORMAL HIGH (ref 0.61–1.24)
GFR, Estimated: 25 mL/min — ABNORMAL LOW (ref >60)
GFR, Estimated: 24 mL/min — ABNORMAL LOW (ref >60)
Glucose, Bld: 146 mg/dL — ABNORMAL HIGH (ref 70–99)
Glucose, Bld: 146 mg/dL — ABNORMAL HIGH (ref 70–99)
Potassium: 3.1 mmol/L — ABNORMAL LOW (ref 3.5–5.1)
Potassium: 3 mmol/L — ABNORMAL LOW (ref 3.5–5.1)
Sodium: 135 mmol/L (ref 135–145)
Sodium: 137 mmol/L (ref 135–145)

## 2023-11-26 LAB — PHOSPHORUS
Phosphorus: 3.2 mg/dL (ref 2.5–4.6)
Phosphorus: 3.4 mg/dL (ref 2.5–4.6)

## 2023-11-26 LAB — TECHNOLOGIST SMEAR REVIEW

## 2023-11-26 LAB — RETICULOCYTES
Immature Retic Fract: 5.8 % (ref 2.3–15.9)
RBC.: 2.99 MIL/uL — ABNORMAL LOW (ref 4.22–5.81)
Retic Count, Absolute: 23.9 K/uL (ref 19.0–186.0)
Retic Ct Pct: 0.8 % (ref 0.4–3.1)

## 2023-11-26 LAB — CREATININE, URINE, RANDOM: Creatinine, Urine: 122 mg/dL

## 2023-11-26 LAB — MAGNESIUM
Magnesium: 1.4 mg/dL — ABNORMAL LOW (ref 1.7–2.4)
Magnesium: 1.8 mg/dL (ref 1.7–2.4)

## 2023-11-26 LAB — CBC
HCT: 26.5 % — ABNORMAL LOW (ref 39.0–52.0)
HCT: 26.1 % — ABNORMAL LOW (ref 39.0–52.0)
Hemoglobin: 8.8 g/dL — ABNORMAL LOW (ref 13.0–17.0)
Hemoglobin: 8.9 g/dL — ABNORMAL LOW (ref 13.0–17.0)
MCH: 29.5 pg (ref 26.0–34.0)
MCH: 29.5 pg (ref 26.0–34.0)
MCHC: 33.2 g/dL (ref 30.0–36.0)
MCHC: 34.1 g/dL (ref 30.0–36.0)
MCV: 88.9 fL (ref 80.0–100.0)
MCV: 86.4 fL (ref 80.0–100.0)
Platelets: 38 K/uL — ABNORMAL LOW (ref 150–400)
Platelets: 44 K/uL — ABNORMAL LOW (ref 150–400)
RBC: 2.98 MIL/uL — ABNORMAL LOW (ref 4.22–5.81)
RBC: 3.02 MIL/uL — ABNORMAL LOW (ref 4.22–5.81)
RDW: 14.6 % (ref 11.5–15.5)
RDW: 15.1 % (ref 11.5–15.5)
WBC: 7.6 K/uL (ref 4.0–10.5)
WBC: 7.5 K/uL (ref 4.0–10.5)
nRBC: 0 % (ref 0.0–0.2)
nRBC: 0.3 % — ABNORMAL HIGH (ref 0.0–0.2)

## 2023-11-26 LAB — DIFFERENTIAL
Abs Immature Granulocytes: 0.03 K/uL (ref 0.00–0.07)
Basophils Absolute: 0 K/uL (ref 0.0–0.1)
Basophils Relative: 0 %
Eosinophils Absolute: 0 K/uL (ref 0.0–0.5)
Eosinophils Relative: 0 %
Immature Granulocytes: 0 %
Lymphocytes Relative: 7 %
Lymphs Abs: 0.6 K/uL — ABNORMAL LOW (ref 0.7–4.0)
Monocytes Absolute: 0.4 K/uL (ref 0.1–1.0)
Monocytes Relative: 5 %
Neutro Abs: 6.6 K/uL (ref 1.7–7.7)
Neutrophils Relative %: 88 %
Smear Review: NORMAL

## 2023-11-26 LAB — CBG MONITORING, ED
Glucose-Capillary: 123 mg/dL — ABNORMAL HIGH (ref 70–99)
Glucose-Capillary: 104 mg/dL — ABNORMAL HIGH (ref 70–99)
Glucose-Capillary: 120 mg/dL — ABNORMAL HIGH (ref 70–99)

## 2023-11-26 LAB — BLOOD GAS, VENOUS
Acid-base deficit: 5.4 mmol/L — ABNORMAL HIGH (ref 0.0–2.0)
Bicarbonate: 20.6 mmol/L (ref 20.0–28.0)
Drawn by: 8669
O2 Saturation: 48.5 %
Patient temperature: 37
pCO2, Ven: 41 mmHg — ABNORMAL LOW (ref 44–60)
pH, Ven: 7.31 (ref 7.25–7.43)
pO2, Ven: 31 mmHg — CL (ref 32–45)

## 2023-11-26 LAB — FERRITIN: Ferritin: 483 ng/mL — ABNORMAL HIGH (ref 24–336)

## 2023-11-26 LAB — IRON AND TIBC: Iron: 69 ug/dL (ref 45–182)

## 2023-11-26 LAB — FOLATE: Folate: 4.1 ng/mL — ABNORMAL LOW (ref >5.9)

## 2023-11-26 LAB — VITAMIN B12: Vitamin B-12: 453 pg/mL (ref 180–914)

## 2023-11-26 LAB — SODIUM, URINE, RANDOM: Sodium, Ur: <30 mmol/L

## 2023-11-26 LAB — LACTIC ACID, PLASMA: Lactic Acid, Venous: 2.3 mmol/L (ref 0.5–1.9)

## 2023-11-26 MED ORDER — ALBUMIN HUMAN 5 % IV SOLN
25.0000 g | Freq: Once | INTRAVENOUS | Status: AC
  Administered 2023-11-27: 25 g via INTRAVENOUS
  Filled 2023-11-26: qty 500

## 2023-11-26 MED ORDER — DULOXETINE HCL 60 MG PO CPEP
60.0000 mg | ORAL_CAPSULE | Freq: Every day | ORAL | Status: DC
  Administered 2023-11-26: 60 mg via ORAL
  Filled 2023-11-26: qty 2

## 2023-11-26 MED ORDER — CALCIUM GLUCONATE-NACL 1-0.675 GM/50ML-% IV SOLN
1.0000 g | Freq: Once | INTRAVENOUS | Status: AC
Start: 2023-11-26 — End: 2023-11-27
  Administered 2023-11-27: 1000 mg via INTRAVENOUS
  Filled 2023-11-26: qty 50

## 2023-11-26 MED ORDER — HYDROMORPHONE HCL 1 MG/ML IJ SOLN
0.5000 mg | INTRAMUSCULAR | Status: DC | PRN
Start: 2023-11-26 — End: 2023-11-27
  Administered 2023-11-26 – 2023-11-27 (×2): 0.5 mg via INTRAVENOUS
  Filled 2023-11-26: qty 0.5
  Filled 2023-11-26: qty 1

## 2023-11-26 MED ORDER — ACETAMINOPHEN 650 MG RE SUPP: 650.0000 mg | Freq: Four times a day (QID) | RECTAL | Status: DC | PRN

## 2023-11-26 MED ORDER — SODIUM CHLORIDE 0.9% FLUSH
3.0000 mL | Freq: Two times a day (BID) | INTRAVENOUS | Status: DC
  Administered 2023-11-26 – 2023-11-27 (×4): 3 mL via INTRAVENOUS

## 2023-11-26 MED ORDER — TAMSULOSIN HCL 0.4 MG PO CAPS
0.4000 mg | ORAL_CAPSULE | Freq: Every day | ORAL | Status: DC
Start: 2023-11-26 — End: 2023-11-26
  Administered 2023-11-26: 0.4 mg via ORAL
  Filled 2023-11-26: qty 1

## 2023-11-26 MED ORDER — LACTATED RINGERS IV SOLN: INTRAVENOUS | Status: AC

## 2023-11-26 MED ORDER — SODIUM CHLORIDE 0.9 % IV SOLN
2.0000 g | INTRAVENOUS | Status: DC
  Administered 2023-11-26 – 2023-11-27 (×2): 2 g via INTRAVENOUS
  Filled 2023-11-26 (×2): qty 12.5

## 2023-11-26 MED ORDER — SODIUM BICARBONATE 8.4 % IV SOLN
100.0000 meq | Freq: Once | INTRAVENOUS | Status: AC
  Administered 2023-11-26: 100 meq via INTRAVENOUS
  Filled 2023-11-26: qty 50

## 2023-11-26 MED ORDER — OXYCODONE HCL 5 MG PO TABS: 5.0000 mg | ORAL_TABLET | Freq: Four times a day (QID) | ORAL | Status: DC | PRN

## 2023-11-26 MED ORDER — INSULIN ASPART 100 UNIT/ML IJ SOLN: 0.0000 [IU] | Freq: Three times a day (TID) | INTRAMUSCULAR | Status: DC

## 2023-11-26 MED ORDER — SENNOSIDES-DOCUSATE SODIUM 8.6-50 MG PO TABS: 1.0000 | ORAL_TABLET | Freq: Two times a day (BID) | ORAL | Status: DC

## 2023-11-26 MED ORDER — VANCOMYCIN HCL IN DEXTROSE 1-5 GM/200ML-% IV SOLN
1000.0000 mg | INTRAVENOUS | Status: DC
  Administered 2023-11-26 – 2023-11-28 (×2): 1000 mg via INTRAVENOUS
  Filled 2023-11-26 (×2): qty 200

## 2023-11-26 MED ORDER — DOXYCYCLINE HYCLATE 100 MG PO TABS
100.0000 mg | ORAL_TABLET | Freq: Two times a day (BID) | ORAL | Status: DC
  Administered 2023-11-26: 100 mg via ORAL
  Filled 2023-11-26 (×2): qty 1

## 2023-11-26 MED ORDER — GABAPENTIN 300 MG PO CAPS
300.0000 mg | ORAL_CAPSULE | Freq: Every day | ORAL | Status: DC
  Filled 2023-11-26: qty 1

## 2023-11-26 MED ORDER — LEVOFLOXACIN 750 MG PO TABS: 750.0000 mg | ORAL_TABLET | Freq: Every day | ORAL | Status: DC

## 2023-11-26 MED ORDER — POTASSIUM CHLORIDE 10 MEQ/100ML IV SOLN
10.0000 meq | INTRAVENOUS | Status: AC
  Administered 2023-11-27 (×4): 10 meq via INTRAVENOUS
  Filled 2023-11-26 (×4): qty 100

## 2023-11-26 MED ORDER — MAGNESIUM SULFATE 2 GM/50ML IV SOLN
2.0000 g | Freq: Once | INTRAVENOUS | Status: AC
  Administered 2023-11-26: 2 g via INTRAVENOUS
  Filled 2023-11-26: qty 50

## 2023-11-26 MED ORDER — LACTATED RINGERS IV BOLUS
500.0000 mL | INTRAVENOUS | Status: AC
  Administered 2023-11-26: 500 mL via INTRAVENOUS

## 2023-11-26 MED ORDER — INSULIN ASPART 100 UNIT/ML IJ SOLN: 0.0000 [IU] | Freq: Every day | INTRAMUSCULAR | Status: DC

## 2023-11-26 MED ORDER — PROCHLORPERAZINE EDISYLATE 10 MG/2ML IJ SOLN
5.0000 mg | Freq: Four times a day (QID) | INTRAMUSCULAR | Status: DC | PRN
  Administered 2023-11-26: 5 mg via INTRAVENOUS
  Filled 2023-11-26: qty 2

## 2023-11-26 MED ORDER — ACETAMINOPHEN 500 MG PO TABS
1000.0000 mg | ORAL_TABLET | Freq: Three times a day (TID) | ORAL | Status: DC
  Administered 2023-11-26: 1000 mg via ORAL
  Filled 2023-11-26 (×2): qty 2

## 2023-11-26 MED ORDER — CHLORHEXIDINE GLUCONATE CLOTH 2 % EX PADS
6.0000 | MEDICATED_PAD | Freq: Every day | CUTANEOUS | Status: DC
  Administered 2023-11-27: 6 via TOPICAL

## 2023-11-26 MED ORDER — LEVOTHYROXINE SODIUM 88 MCG PO TABS
88.0000 ug | ORAL_TABLET | Freq: Every day | ORAL | Status: DC
  Administered 2023-11-26: 88 ug via ORAL
  Filled 2023-11-26 (×3): qty 1

## 2023-11-26 MED ORDER — NOREPINEPHRINE 4 MG/250ML-% IV SOLN
0.0000 ug/min | INTRAVENOUS | Status: DC
  Administered 2023-11-26 – 2023-11-27 (×2): 2 ug/min via INTRAVENOUS
  Administered 2023-11-27: 6 ug/min via INTRAVENOUS
  Filled 2023-11-26 (×3): qty 250

## 2023-11-26 MED ORDER — POLYETHYLENE GLYCOL 3350 17 G PO PACK: 17.0000 g | PACK | Freq: Every day | ORAL | Status: DC | PRN

## 2023-11-26 MED ORDER — FENTANYL CITRATE PF 50 MCG/ML IJ SOSY
12.5000 ug | PREFILLED_SYRINGE | Freq: Once | INTRAMUSCULAR | Status: AC
  Administered 2023-11-26: 12.5 ug via INTRAVENOUS
  Filled 2023-11-26: qty 1

## 2023-11-26 MED ORDER — SODIUM CHLORIDE 0.9 % IV SOLN: 250.0000 mL | INTRAVENOUS | Status: AC

## 2023-11-26 MED ORDER — VANCOMYCIN HCL IN DEXTROSE 1-5 GM/200ML-% IV SOLN
1000.0000 mg | Freq: Once | INTRAVENOUS | Status: DC
Start: 2023-11-26 — End: 2023-11-28
  Filled 2023-11-26: qty 200

## 2023-11-26 MED ORDER — LACTATED RINGERS IV SOLN
INTRAVENOUS | Status: AC
Start: 2023-11-26 — End: 2023-11-26

## 2023-11-26 MED ORDER — SODIUM CHLORIDE 0.9 % IV BOLUS
500.0000 mL | Freq: Once | INTRAVENOUS | Status: AC
  Administered 2023-11-26: 500 mL via INTRAVENOUS

## 2023-11-26 MED ORDER — EZETIMIBE 10 MG PO TABS: 10.0000 mg | ORAL_TABLET | Freq: Every evening | ORAL | Status: DC

## 2023-11-26 MED ORDER — LEVOFLOXACIN 750 MG PO TABS
750.0000 mg | ORAL_TABLET | ORAL | Status: DC
  Filled 2023-11-26: qty 1

## 2023-11-26 MED ORDER — ACETAMINOPHEN 325 MG PO TABS: 650.0000 mg | ORAL_TABLET | Freq: Four times a day (QID) | ORAL | Status: DC | PRN

## 2023-11-26 MED ORDER — POTASSIUM CHLORIDE CRYS ER 10 MEQ PO TBCR: 10.0000 meq | EXTENDED_RELEASE_TABLET | Freq: Once | ORAL | Status: DC

## 2023-11-26 MED ORDER — MAGNESIUM SULFATE 2 GM/50ML IV SOLN
2.0000 g | Freq: Once | INTRAVENOUS | Status: AC
  Administered 2023-11-27: 2 g via INTRAVENOUS
  Filled 2023-11-26: qty 50

## 2023-11-26 MED ORDER — MIDODRINE HCL 5 MG PO TABS
10.0000 mg | ORAL_TABLET | Freq: Three times a day (TID) | ORAL | Status: DC
  Administered 2023-11-26: 10 mg via ORAL
  Filled 2023-11-26: qty 2

## 2023-11-26 MED ORDER — MIDODRINE HCL 5 MG PO TABS
5.0000 mg | ORAL_TABLET | Freq: Two times a day (BID) | ORAL | Status: DC
Start: 2023-11-26 — End: 2023-11-26
  Administered 2023-11-26: 5 mg via ORAL
  Filled 2023-11-26: qty 1

## 2023-11-26 MED ORDER — SENNA 8.6 MG PO TABS: 1.0000 | ORAL_TABLET | Freq: Every day | ORAL | Status: DC | PRN

## 2023-11-26 MED ORDER — ATORVASTATIN CALCIUM 40 MG PO TABS
40.0000 mg | ORAL_TABLET | Freq: Every day | ORAL | Status: DC
  Administered 2023-11-26: 40 mg via ORAL
  Filled 2023-11-26: qty 1

## 2023-11-26 MED ORDER — LACTATED RINGERS IV BOLUS
500.0000 mL | Freq: Once | INTRAVENOUS | Status: AC
  Administered 2023-11-26: 500 mL via INTRAVENOUS

## 2023-11-26 NOTE — Progress Notes (Signed)
 NAME:  Harold [name], MRN:  [id number], DOB:  [birth year], LOS: 1 ADMISSION DATE:  11/25/2023, CONSULTATION DATE:  11/26/2023 REFERRING MD:  Dr. Keturah, CHIEF COMPLAINT:  hypotension   History of Present Illness:  Mr. 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   Pertinent  Medical History  Per above  Significant Hospital Events: Including procedures, antibiotic start and stop dates in addition to other pertinent events   10/8: Admitted to   Interim History / Subjective:  ***  Objective    Blood pressure (!) 86/55, pulse 68, temperature 97.9 F (36.6 C), temperature source Axillary, resp. rate 16, height 6' 1 (1.854 m), weight 98.2 kg, SpO2 100%.        Intake/Output Summary (Last 24 hours) at 11/26/2023 2248 Last data filed at 11/26/2023 2156 Gross per 24 hour  Intake 4695.53 ml  Output --  Net 4695.53 ml   Filed Weights   11/26/23 2205  Weight: 98.2 kg    Examination: General: pale, chronically ill appearing gentleman, somnolent  HENT: *** Lungs: *** Cardiovascular: *** Abdomen: *** Extremities: *** Neuro: *** GU: ***  Resolved problem list   Assessment and Plan   Acute metabolic encephalopathy  - Avoid sedating medications - Delirium precautions   Mixed septic and hypovolemic shock Lactic acidosis Likely septic due to chronic ***. Also hypovolemic in the setting of poor PO intake over the last few days. Blood cultures 10/7 no growth.  - Continue levophed as needed to maintain MAP>65 - Continue volume resuscitation with mIVF and will also give albumin  in the setting of significant hypoalbuminemia - Will discuss amputation for  source control with orthopedics although currently does not appear to be a candidate for surgery. Agree with palliative consult. - Consider ID consult- continue Vancomycin  and Cefepime  for now  - Follow-up urine culture   AKI on CKD Hypokalemia Hypomagnesemia    Acute on chronic anemia Patient received 2 PRBCs for Hgb 6.1 with appropriate response. Hgb remains stable. FOBT positive. Denies melena or BRPR at home. Has had some bleeding from leg wounds.  - Continue folic acid -   Thrombocytopenia  Suspect thrombocytopenia likely multifactorial in the setting of sepsis and recent IV antibiotics and chronic suppressive Levaquin  and doxycycline .  - Continue to trend daily CBC - Platelet goal >20 unless actively bleeding    Labs   CBC: Recent Labs  Lab 11/25/23 2057 11/25/23 2132 11/26/23 0548 11/26/23 2015  WBC 8.0  --  7.6 7.5  NEUTROABS  --   --  6.6  --   HGB 6.3* 6.1* 8.8* 8.9*  HCT 18.7* 18.0* 26.5* 26.1*  MCV 88.6  --  88.9 86.4  PLT 46*  --  38* 44*    Basic Metabolic Panel: Recent Labs  Lab 11/25/23 2057 11/25/23 2119 11/25/23 2132 11/26/23 0547 11/26/23 0548 11/26/23 2015  NA 137  --  139  --  135 137  K 3.3*  --  3.3*  --  3.1* 3.0*  CL 105  --  106  --  104 105  CO2 17*  --   --   --  14* 19*  GLUCOSE 121*  --  110*  --  146* 146*  BUN 40*  --  33*  --  43* 45*  CREATININE 2.56*  --  2.60*  --  2.67* 2.80*  CALCIUM  7.5*  --   --   --  7.3* 7.5*  MG  --  1.5*  --   --  1.4* 1.8  PHOS  --   --   --  3.2  --  3.4   GFR: Estimated Creatinine Clearance: 30.3 mL/min (A) (by C-G formula based on SCr of 2.8 mg/dL (H)). Recent Labs  Lab 11/25/23 2057 11/25/23 2132 11/26/23 0548 11/26/23 2015  WBC 8.0  --  7.6 7.5  LATICACIDVEN  --  1.9  --  2.3*    Liver Function Tests: Recent Labs  Lab 11/25/23 2057  AST 16  ALT 8  ALKPHOS 83  BILITOT 1.3*  PROT 4.3*  ALBUMIN  1.7*   No results for input(s): LIPASE, AMYLASE in the last 168 hours. No  results for input(s): AMMONIA in the last 168 hours.  ABG    Component Value Date/Time   PHART 7.44 05/15/2023 2309   PCO2ART 31 (L) 05/15/2023 2309   PO2ART 104 05/15/2023 2309   HCO3 20.6 11/26/2023 2015   TCO2 17 (L) 11/25/2023 2132   ACIDBASEDEF 5.4 (H) 11/26/2023 2015   O2SAT 48.5 11/26/2023 2015     Coagulation Profile: No results for input(s): INR, PROTIME in the last 168 hours.  Cardiac Enzymes: No results for input(s): CKTOTAL, CKMB, CKMBINDEX, TROPONINI in the last 168 hours.  HbA1C: Hgb A1c MFr Bld  Date/Time Value Ref Range Status  07/18/2023 02:27 AM 5.2 4.8 - 5.6 % Final    Comment:    (NOTE) Diagnosis of Diabetes The following HbA1c ranges recommended by the American Diabetes Association (ADA) may be used as an aid in the diagnosis of diabetes mellitus.  Hemoglobin             Suggested A1C NGSP%              Diagnosis  <5.7                   Non Diabetic  5.7-6.4                Pre-Diabetic  >6.4                   Diabetic  <7.0                   Glycemic control for                       adults with diabetes.    01/02/2023 04:45 PM 7.0 (H) 4.8 - 5.6 % Final    Comment:    (NOTE) Pre diabetes:          5.7%-6.4%  Diabetes:              >6.4%  Glycemic control for   <7.0% adults with diabetes     CBG: Recent Labs  Lab 11/26/23 0737 11/26/23 1139 11/26/23 1738  GLUCAP 123* 104* 120*    Review of Systems:   ***  Past Medical History:  He,  has a past medical history of Anemia, Anginal pain (04/2013), Anxiety, Atrial fibrillation (HCC) (12/2021), CAD S/P percutaneous coronary angioplasty (2003, 04/2012), Carotid artery occlusion, Chronic renal insufficiency, stage II (mild), Chronic venous insufficiency, Complication of anesthesia, COPD, mild (HCC), Depression, Diabetes mellitus, Diabetic neuropathy (HCC) (08/24/2019), Dyslipidemia associated with type 2 diabetes  mellitus (HCC), Fall at home, initial encounter (01/02/2023),  GERD (gastroesophageal reflux disease), History of cardioversion (12/2021), HTN (hypertension), Hypothyroidism, Neuromuscular disorder (HCC), Neuropathy, Obesity, Obesity, Class II, BMI 35.0-39.9, with comorbidity (see actual BMI), Open ankle fracture (01/02/2023), Orthostatic hypotension, 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   Surgical History:   Past Surgical History:  Procedure Laterality Date   ABDOMINAL AORTAGRAM N/A 11/15/2011   Procedure: ABDOMINAL EZELLA;  Surgeon: Carlin FORBES Haddock, MD;  Location: [facility] CATH LAB;  Service: Cardiovascular;  [name];   ABDOMINAL AORTOGRAM W/LOWER EXTREMITY N/A 10/13/2019   Procedure: ABDOMINAL AORTOGRAM W/LOWER EXTREMITY;  Surgeon: Darron Deatrice LABOR, MD;  Location: [facility] INVASIVE CV LAB;  Service: CV: Non-obst Ao-Iliac. R SFA-PopA patent with significant calcific stenosis of TP trunk-prox PTA (dominant) & CTO ATA  -> R PTA-TP PTCA   ABDOMINAL AORTOGRAM W/LOWER EXTREMITY N/A 11/06/2022   Procedure: ABDOMINAL AORTOGRAM W/LOWER EXTREMITY;  Surgeon: Darron Deatrice LABOR, MD;  Location: [facility] LAB;  Service: Cardiovascular;  [name];   ABDOMINAL AORTOGRAM W/LOWER EXTREMITY Right 04/01/2023   Procedure: ABDOMINAL AORTOGRAM W/LOWER EXTREMITY;  Surgeon: Serene Gaile ORN, MD;  Location: [facility] LAB;  Service: Cardiovascular;  Laterality: Right;   ABIs  04/27/2012   mild bilateral arterial insufficiency   ANKLE FUSION Right 05/15/2023   Procedure: ARTHRODESIS ANKLE WITH HINDFOOT FUSION NAIL;  Surgeon: Kendal Franky SQUIBB, MD;  [name];  Service: Orthopedics;  Laterality: Right;   APPLICATION OF WOUND VAC Right 03/31/2023   Procedure: APPLICATION OF WOUND VAC;  Surgeon: Kendal Franky SQUIBB, MD;  [name];  Service: Orthopedics;  Laterality: Right;   BACK SURGERY  2005 x1   X2-2010   BUNIONECTOMY Right 11/11/2013    Procedure: RIGHT FOOT SILVER BUNIONECTOMY;  Surgeon: Norleen Armor, MD;  Location: [facility];  Service: Orthopedics;  Laterality: Right;   CARDIAC CATHETERIZATION  12/11/2001   significant 3V CAD, normal LV function   CARDIOVERSION N/A 03/13/2022   Procedure: CARDIOVERSION;  Surgeon: Kate Lonni CROME, MD;  Location: [facility] ENDOSCOPY;  Service: Cardiovascular;  [name];   CARDIOVERSION N/A 07/25/2022   Procedure: CARDIOVERSION;  Surgeon: Jeffrie Oneil BROCKS, MD;  Location: [facility];  Service: Cardiovascular;  [name];   Carotid Doppler  04/27/2012   right internal carotid: Elevated velocities but no evidence of plaque. Left internal carotid 40-59%   CAROTID ENDARTERECTOMY  2005   Right; recent carotid Dopplers notes elevated velocities.    colonscopy     CORONARY ANGIOPLASTY  12/21/2001   PTCA of the distal and mid AV groove circ, unsuccessful PTCA of second OM total occlusion, unsuccessful PTCA of the apical LAD total occlusion   CORONARY ANGIOPLASTY WITH STENT PLACEMENT  04/29/2012   PCI to 3 RCA lesions, Promus Premiere 2.62mmx8mm distally, mid was 2.2mx28mm and proximally 2.75x45mm, EF 55-60%   CORONARY STENT PLACEMENT  04/28/2012   PCI to LAD (3x45mm Xience DES postdilated to 3.25) and circ prox and mid (2 overlappinmg 2.62mmx12mmXience DES postdilated to 2.31mm)   DOPPLER ECHOCARDIOGRAPHY  04/28/2012   poor quality study: EF estimated 60-65%; unable to assess diastolic function (previously noted to have diastolic dysfunction); severely dilated left atrium and mild right atrium; dilated IVC consistent with increased central venous pressure.SABRA   EXTERNAL FIXATION LEG Right 05/12/2023   Procedure: EXTERNAL FIXATION, LOWER EXTREMITY;  Surgeon: Kendal Franky SQUIBB, MD;  [name];  Service: Orthopedics;  Laterality: Right;   HARDWARE REMOVAL Right 03/31/2023   Procedure: HARDWARE REMOVAL RIGHT ANKLE;  Surgeon: Kendal Franky SQUIBB, MD;  [name];  Service:  Orthopedics;  Laterality: Right;   HARDWARE REMOVAL Right 01/28/2023   Procedure: REMOVAL OF HARDWARE RIGHT ANKLE;  [name], MD;  [name];  Service: Orthopedics;  Laterality: Right;   HARDWARE REMOVAL Right 06/21/2023   Procedure: REMOVAL, HARDWARE;  Surgeon: Kendal Franky SQUIBB, MD;  [name];  Service: Orthopedics;  Laterality: Right;   HERNIA REPAIR     I & D EXTREMITY Right 01/02/2023   Procedure: IRRIGATION AND DEBRIDEMENT RIGHT ANKLE;  [name], MD;  [name];  Service: Orthopedics;  Laterality: Right;   I & D EXTREMITY Right 04/02/2023   Procedure: DEBRIDEMENT RIGHT ANKLE;  Surgeon: Harden Jerona GAILS, MD;  Location: [facility] OR;  Service: Orthopedics;  Laterality: Right;   INCISION AND DRAINAGE OF WOUND Right 09/19/2023   Procedure: IRRIGATION AND DEBRIDEMENT WOUND;  Surgeon: Kendal Franky SQUIBB, MD;  [name];  Service: Orthopedics;  Laterality: Right;   IR FLUORO GUIDE CV LINE LEFT  04/22/2023   IR FLUORO GUIDE CV LINE RIGHT  04/07/2023   IR PATIENT EVAL TECH 0-60 MINS  04/07/2023   IR RADIOLOGIST EVAL & MGMT  04/08/2023   IR REMOVAL TUN CV CATH W/O FL  05/05/2023   IR US  GUIDE VASC ACCESS LEFT  04/22/2023   IR US  GUIDE VASC ACCESS RIGHT  04/07/2023   LEFT AND RIGHT HEART CATHETERIZATION WITH CORONARY ANGIOGRAM N/A 04/27/2012   Procedure: LEFT AND RIGHT HEART CATHETERIZATION WITH CORONARY ANGIOGRAM;  [name], MD;  Location: [facility] CATH LAB;  Service: Cardiovascular;  [name];   LEFT AND RIGHT HEART CATHETERIZATION WITH CORONARY ANGIOGRAM N/A 05/14/2013   Procedure: LEFT AND RIGHT HEART CATHETERIZATION WITH CORONARY ANGIOGRAM;  [name], MD;  [name]; PCWP ;; [name], [name], Patent RCA ostial mid and distal stents - 70% distal runoff Dz   LUMBAR  LAMINECTOMY/DECOMPRESSION MICRODISCECTOMY  01/07/2012   Procedure: LUMBAR LAMINECTOMY/DECOMPRESSION MICRODISCECTOMY 1 LEVEL;  [name], MD;  [name];  Service: Neurosurgery;  Laterality: Bilateral;   Lumbar Three-Four Decompression   LUMBAR LAMINECTOMY/DECOMPRESSION MICRODISCECTOMY N/A 07/26/2020   Procedure: Lumbar two-three Laminectomy/Foraminotomy;  [name], MD;  [name];  Service: Neurosurgery;  [name];   ORIF ANKLE FRACTURE Right 01/02/2023   Procedure: OPEN REDUCTION INTERNAL FIXATION (ORIF)RIGHT ANKLE FRACTURE;  [name], MD;  [name];  Service: Orthopedics;  Laterality: Right;   PERCUTANEOUS CORONARY STENT INTERVENTION (PCI-S) N/A 04/28/2012   Procedure: PERCUTANEOUS CORONARY STENT INTERVENTION (PCI-S);  [name], MD;  Location: [facility] CATH LAB;  Service: Cardiovascular;  [name];   PERCUTANEOUS CORONARY STENT INTERVENTION (PCI-S) N/A 04/29/2012   Procedure: PERCUTANEOUS CORONARY STENT INTERVENTION (PCI-S);  [name], MD;  Location: [facility] CATH LAB;  Service: Cardiovascular;  [name];   PERIPHERAL VASCULAR ATHERECTOMY  11/06/2022   Procedure: PERIPHERAL VASCULAR ATHERECTOMY;  Surgeon: Darron Deatrice DELENA, MD;  [facility]: [facility];  Service: Cardiovascular;;   PERIPHERAL VASCULAR BALLOON ANGIOPLASTY Right 10/13/2019   Procedure: PERIPHERAL VASCULAR BALLOON ANGIOPLASTY;  Surgeon: Darron Deatrice DELENA, MD;  Location: [facility];  Service: Cardiovascular;  Laterality: Right;  PTCA of R TP Trunk into PTA (Drug-coated) => Follow-up ABIs 0.9 on the right and 0.99 on the left.   PERIPHERAL VASCULAR BALLOON ANGIOPLASTY Right 04/01/2023   Procedure: PERIPHERAL VASCULAR BALLOON ANGIOPLASTY;  Surgeon: Serene Gaile ORN, MD;  Location: [facility];  Service: Cardiovascular;  Laterality: Right;   PERIPHERAL VASCULAR INTERVENTION Right 04/01/2023   Procedure: PERIPHERAL  VASCULAR INTERVENTION;  Surgeon: Serene Gaile ORN, MD;  Location: [facility];  Service: Cardiovascular;  Laterality: Right;   SPINE SURGERY     UMBILICAL HERNIA REPAIR  2009   steel mesh insert     Social History:   reports that he quit smoking about 20 years ago. His smoking use included cigarettes. He started smoking about 62 years ago. He has a 42 pack-year smoking history. He has never used smokeless tobacco. He reports that he does not drink alcohol and does not use drugs.   Family History:  His He was adopted. Family history is unknown by patient.   Allergies Allergies  Allergen Reactions   Beta-Lactamase Inhibitors (Beta-Lactam) Hives, Itching and Rash    AX - penicillin     Ancef  given 9/24 with no obvious reaction   Other Nausea And Vomiting    General Anesthesia - sometimes causes nausea and vomiting * OK to use scopolamine  patch     Wellbutrin [Bupropion] Other (See Comments)    Mood Changes   Metformin  And Related Diarrhea and Nausea Only   Sulfa Antibiotics Hives and Other (See Comments)    Bactrim   Tetracyclines & Related Hives and Rash    doxycycline      Home Medications  Prior to Admission medications   Medication Sig Start Date End Date Taking? Authorizing Provider  acetaminophen  (TYLENOL ) 500 MG tablet Take 500-1,000 mg by mouth every 6 (six) hours as needed for moderate pain (pain score 4-6).   Yes [provider]  allopurinol  (ZYLOPRIM ) 100 MG tablet Take 0.5 tablets (50 mg total) by mouth daily. 05/10/23  Yes Vernon Ranks, MD  aspirin  EC 81 MG tablet Take 81 mg by mouth every 4 (four) hours as needed for moderate pain (pain score 4-6).   Yes [provider]  atorvastatin  (LIPITOR) 40 MG tablet Take 1 tablet (40 mg total) by mouth daily. Patient taking differently: Take 40 mg by mouth at bedtime. 05/10/23 03/02/24 Yes Pahwani, Ranks, MD  clopidogrel  (PLAVIX ) 75 MG tablet Take 75 mg by mouth every morning. 02/04/23  Yes [provider]  doxycycline  (VIBRA -TABS) 100 MG tablet Take 1 tablet (100 mg total) by mouth 2 (two) times daily. 10/15/23  Yes Kuppelweiser, Cassie L, RPH-CPP  DULoxetine  (CYMBALTA ) 60 MG capsule Take 60 mg by mouth daily.   Yes [provider]  ezetimibe  (ZETIA ) 10 MG tablet Take 10 mg by mouth every evening. 05/09/22  Yes [provider]  ferrous sulfate  325 (65 FE) MG tablet Take 325 mg by mouth daily. 06/08/23  Yes [provider]  gabapentin  (NEURONTIN ) 300 MG capsule Take 1 capsule (300 mg total) by mouth at bedtime. 05/10/23  Yes Vernon Ranks, MD  HYDROcodone -acetaminophen  (NORCO/VICODIN) 5-325 MG tablet Take 1 tablet by mouth every 6 (six) hours as needed for moderate pain (pain score 4-6). 09/22/23  Yes Lyles, Viktoria, DO  hydrocortisone  cream 1 % Apply topically 3 (three) times daily. Patient taking differently: Apply 1 Application topically 3 (three) times daily as needed for itching. 09/19/23  Yes Lyles, Viktoria, DO  levofloxacin  (LEVAQUIN ) 750 MG tablet  Take 1 tablet (750 mg total) by mouth daily. Patient taking differently: Take 750 mg by mouth at bedtime. 10/15/23  Yes Kuppelweiser, Cassie L, RPH-CPP  levothyroxine  (SYNTHROID , LEVOTHROID) 88 MCG tablet Take 88 mcg by mouth daily before breakfast.   Yes [provider]  melatonin 3 MG TABS tablet Take 3 mg by mouth at bedtime as needed (Sleep).   Yes [provider]  methocarbamol  (ROBAXIN ) 500 MG tablet Take 500 mg by mouth every 6 (six) hours as needed for muscle spasms. 10/27/23  Yes [provider]  midodrine  (PROAMATINE ) 5 MG tablet Take 5 mg by mouth 2 (two) times daily.   Yes [provider]  naproxen sodium (ALEVE) 220 MG tablet Take 220 mg by mouth 2 (two) times daily as needed (Pain).   Yes [provider]  nitroGLYCERIN  (NITROSTAT ) 0.4 MG SL tablet PLACE 1 TAB UNDER THE TONGUE EVERY 5 MINUTES AS NEEDED FOR CHEST PAIN (SEVERE PRESSURE OR TIGHTNESS) 02/22/19  Yes  Anner Alm ORN, MD  ondansetron  (ZOFRAN ) 4 MG tablet Take 1 tablet (4 mg total) by mouth every 8 (eight) hours as needed for nausea or vomiting. 09/22/23  Yes Lyles, Elliott, DO  oxycodone  (OXY-IR) 5 MG capsule Take 5 mg by mouth every 4 (four) hours as needed for pain.   Yes [provider]  pantoprazole  (PROTONIX ) 40 MG tablet Take 40 mg by mouth at bedtime. 10/18/23  Yes [provider]  tamsulosin  (FLOMAX ) 0.4 MG CAPS capsule Take 0.4 mg by mouth daily.   Yes [provider]  traMADol (ULTRAM) 50 MG tablet Take 50 mg by mouth every 6 (six) hours as needed for moderate pain (pain score 4-6).   Yes [provider]  Vitamin D , Ergocalciferol , (DRISDOL ) 1.25 MG (50000 UNIT) CAPS capsule Take 1 capsule (50,000 Units total) by mouth every 7 (seven) days. 05/27/23  Yes Lue Elsie BROCKS, MD  apixaban  (ELIQUIS ) 5 MG TABS tablet TAKE 1 TABLET BY MOUTH TWICE A DAY Patient not taking: Reported on 11/26/2023 02/17/23   Anner Alm ORN, MD  carvedilol  (COREG ) 3.125 MG tablet Take 3.125 mg by mouth 2 (two) times daily. Patient not taking: Reported on 11/26/2023 10/18/23   [provider]  fludrocortisone  (FLORINEF ) 0.1 MG tablet Take 100 mcg by mouth daily. Patient not taking: Reported on 11/26/2023 09/30/23   [provider]  losartan  (COZAAR ) 25 MG tablet Take 25 mg by mouth daily. Patient not taking: Reported on 11/26/2023    [provider]  tirzepatide  (MOUNJARO ) 15 MG/0.5ML Pen Inject 15 mg into the skin once a week. Patient not taking: Reported on 11/26/2023 12/30/22           The patient is critically ill with multiple organ system failure and requires high complexity decision making for assessment and support, frequent evaluation and titration of therapies, advanced monitoring, review of radiographic studies and interpretation of complex data.   Critical Care Time devoted to patient care services, exclusive of separately billable  procedures, described in this note is 40 minutes.  [name], PA-C [facility] 11/26/23 11:40 PM  Please see [email or web address] for pager details.  From 7A-7P if no response, please call [phone] After hours, please call ELink [phone]

## 2023-11-26 NOTE — Progress Notes (Addendum)
 PROGRESS NOTE  Harold [name]  DOB: May 10, 1955  PCP: Patient, No Pcp Per FMW:[id number]  DOA: 11/25/2023  LOS: 1 day  Hospital Day: 2  Subjective: Patient was seen and examined this morning. Pleasant elderly Caucasian male.  Lying on bed.  Complains of pain everywhere.  Unable to specify.  Family not at bedside.  States he lives with his [name]. Chart reviewed. Remains afebrile, blood pressure gradually improved overnight to 100s, breathing on room air.  Brief narrative: Harold [name] is a 68 y.o. male with PMH significant for DM2, HTN, orthostatic hypotension, HLD, OSA on CPAP, CAD/stent, A-fib on Eliquis , carotid artery occlusion, CKD, chronic anemia, chronic venous insufficiency, neuropathy, COPD, GERD, hypothyroidism, anxiety/depression, and chronic RLE infection involving hardware  10/7, patient was brought to ED by EMS from home with complaint generalized weakness, altered mental status, loss of appetite for last 1 week and a low hemoglobin of 5 on blood work done few days prior. Per family, patient has been bedbound for the duration, not wanting to eat or drink much, refused to come to the hospital despite low hemoglobin of 5 and outpatient blood work. He has chronic RLE infection involving the hardware and refused amputation in the past.  He is on chronic suppressive antibiotics with doxycycline  and Levaquin .  Complains of chronic bilateral lower extremity pain for months to years.  In the ED, patient was afebrile, heart rate in 70s, blood pressure in low normal range and later dropped as low 70s.  Breathing on room air Initial labs with WC count 8, hemoglobin 6.3, platelet 46, potassium 3.3, BUN/creatinine 40/2.56, serum bicarb 17, albumin  low at 1.7, magnesium  low at 1.5 FOBT positive Respiratory virus panel unremarkable Urinalysis showed cloudy yellow urine with small leukocytes, rare bacteria   EKG with normal sinus rhythm in 70s, QTc prolonged at 588  ms. CTh showed no acute intracranial abnormality but showed moderate global parenchymal volume loss advanced for age.   Chest x-ray unremarkable  Blood culture and urine culture collected Patient was started on IV Comycin, IV cefepime , IV Flagyl  Given 2 units of PRBC transfusion Admitted to [facility]  Assessment and plan: Acute on chronic anemia Chronic folic acid deficiency FOBT positive stool Hemoglobin chronically low between 7 and 8. Hemoglobin at presentation low at 6.1, FOBT positive, iron level vitamin B-12 level adequate, folate level low 2 units PRBC transfusion given. Start folic acid supplement GI consult requested. Per partner, not been taking plavix  for last 3 days.  Recent Labs    03/28/23 0810 03/29/23 0407 05/03/23 0900 05/06/23 0918 06/20/23 0856 06/20/23 0900 06/21/23 9366 09/21/23 0435 09/22/23 1252 11/25/23 2057 11/25/23 2058 11/25/23 2119 11/25/23 2132 11/26/23 0548  HGB  --    < > 8.3*   < >  --   --    < > 7.5* 7.3* 6.3*  --   --  6.1* 8.8*  MCV  --    < > 89.6   < >  --   --    < > 91.4 91.1 88.6  --   --   --  88.9  VITAMINB12 305  --   --   --  554  --   --   --   --   --   --  487  --  453  FOLATE  --   --   --   --  5.3*  --   --   --   --   --  4.4*  --   --  4.1*  FERRITIN  --   --  179  --  185  --   --   --   --  446*  --   --   --  483*  TIBC  --   --  190*  --  139*  --   --   --   --  NOT CALCULATED  --   --   --  NOT CALCULATED  IRON  --   --  43*  --  33*  --   --   --   --  65  --   --   --  69  RETICCTPCT  --   --   --   --  2.3 2.4  --   --   --   --  0.7  --   --  0.8   < > = values in this interval not displayed.   Thrombocytopenia Platelet level significantly low.  Previously normal. Continue to monitor. Recent Labs  Lab 11/25/23 2057 11/26/23 0548  PLT 46* 38*   AKI on CKD 3B  Acute metabolic acidosis Baseline creatinine less than 1.7.   Presented with creatinine elevated to 2.56 and serum bicarb low at 17. Creatinine and  serum bicarb both in the wrong direction today. Continue to monitor with IV hydration.  Currently on LR at 125 mL/h  Recent Labs    08/01/23 0541 08/02/23 0244 08/07/23 0922 08/08/23 0318 09/16/23 1245 09/17/23 0512 09/18/23 1121 09/19/23 0439 11/25/23 2057 11/25/23 2132 11/26/23 0548  BUN 13 12 11 11 19 18 14 13  40* 33* 43*  CREATININE 1.81* 1.71* 1.94* 1.93* 1.53* 1.58* 1.89* 1.73* 2.56* 2.60* 2.67*  CO2 24 21* 21* 21* 22 21* 20* 21* 17*  --  14*   Chronic hardware-associated RLE infection  Chronic RLE pain He has chronic RLE infection involving the hardware and refused amputation in the past.  He is on chronic suppressive antibiotics with doxycycline  and Levaquin .   Was given broad-spectrum antibiotics in the ED.  Currently continued on previous home medicines. Complains of chronic bilateral lower extremity pain for months to years.  Was on Neurontin , Norco, Pain regimen --- Scheduled: Tylenol  1 g 3 times daily, gabapentin  300 mg nightly --- PRN: Oxycodone , IV Dilaudid   Prolonged Qtc Initial EKG with QTc significantly prolonged to 588 ms.  Chronically on Levaquin  Recheck EKG after electrolyte replacement  Hypokalemia/hypomagnesemia Potassium magnesium  level significantly low today.  Replacement ordered. check phosphorus level as well Recent Labs  Lab 11/25/23 2057 11/25/23 2119 11/25/23 2132 11/26/23 0547 11/26/23 0548  K 3.3*  --  3.3*  --  3.1*  MG  --  1.5*  --   --  1.4*  PHOS  --   --   --  3.2  --    Acute encephalopathy H/o anxiety/depression Multifactorial etiology of altered mental status: Anemia, dehydration, low electrolytes, failure to thrive PTA meds- Cymbalta , Neurontin  Resume both  Paroxysmal A-fib It seems patient was previously on amiodarone  which has been on hold since June Supposed to be on but does not seem to be taking Coreg . Currently in normal sinus rhythm. Chronically anticoagulated with Eliquis .  On hold due to GI bleed   H/o  CAD HLD No anginal complaints currently PTA meds- Plavix , Eliquis , Lipitor, Zetia  Keep Eliquis  and Plavix  on hold due to GI bleeding.  Continue Lipitor and Zetia   Hypertension H/o orthostatic hypotension Chronic diastolic CHF PTA meds- midodrine  5 mg 3 times daily as needed  Does not seem to be on any diuretics. Continue midodrine  Most recent echo from February 2025 with EF 55 to 60%, G2 DD, mildly elevated PASP  Type 2 diabetes mellitus Diabetic neuropathy A1c 5.2 on May 2025 PTA meds-Lantus  8 units nightly, Humalog Premeal Currently on SSI/Accu-Cheks Gabapentin  plan as above Recent Labs  Lab 11/26/23 0737  GLUCAP 123*   COPD; OSA   Respiratory status stable.  COPD not in exacerbation   Continue CPAP while sleeping   Hypothyroidism Synthroid   Malnutrition Hypoalbuminemia Albumin  level significantly low at 1.6.  Nutrition consult  BPH Flomax   Generalized weakness Bedbound status for last few days. PT eval requested.   Mobility:  PT Orders:   PT Follow up Rec:    Goals of care   Code Status: Full Code     DVT prophylaxis:  SCDs Start: 11/26/23 0438   Antimicrobials: Currently continued on Levaquin  and doxycycline  Fluid: Currently on LR at 125 mL/h Consultants: GI called Family Communication: None at bedside  Status: Inpatient Level of care:  Progressive   Patient is from: Home Needs to continue in-hospital care: Pending GI workup Anticipated d/c to: Pending clinical course, PT eval   Diet:  Diet Order             Diet clear liquid Room service appropriate? Yes; Fluid consistency: Thin  Diet effective now                   Scheduled Meds:  acetaminophen   1,000 mg Oral TID   atorvastatin   40 mg Oral Daily   doxycycline   100 mg Oral BID   DULoxetine   60 mg Oral Daily   ezetimibe   10 mg Oral QPM   gabapentin   300 mg Oral QHS   insulin  aspart  0-5 Units Subcutaneous QHS   insulin  aspart  0-6 Units Subcutaneous TID WC   levofloxacin    750 mg Oral Q48H   levothyroxine   88 mcg Oral QAC breakfast   midodrine   5 mg Oral BID   potassium chloride   10 mEq Oral Once   sodium chloride  flush  3 mL Intravenous Q12H   tamsulosin   0.4 mg Oral QPC breakfast    PRN meds: HYDROmorphone  (DILAUDID ) injection, oxyCODONE , prochlorperazine, senna   Infusions:   lactated ringers  125 mL/hr at 11/26/23 0544    Antimicrobials: Anti-infectives (From admission, onward)    Start     Dose/Rate Route Frequency Ordered Stop   11/26/23 2200  levofloxacin  (LEVAQUIN ) tablet 750 mg        750 mg Oral Every 48 hours 11/26/23 0920     11/26/23 1000  doxycycline  (VIBRA -TABS) tablet 100 mg        100 mg Oral 2 times daily 11/26/23 0440     11/26/23 1000  levofloxacin  (LEVAQUIN ) tablet 750 mg  Status:  Discontinued        750 mg Oral Daily 11/26/23 0440 11/26/23 0919   11/25/23 2115  ceFEPIme  (MAXIPIME ) 2 g in sodium chloride  0.9 % 100 mL IVPB        2 g 200 mL/hr over 30 Minutes Intravenous  Once 11/25/23 2114 11/25/23 2214   11/25/23 2115  metroNIDAZOLE  (FLAGYL ) IVPB 500 mg        500 mg 100 mL/hr over 60 Minutes Intravenous  Once 11/25/23 2114 11/25/23 2326   11/25/23 2115  vancomycin  (VANCOCIN ) IVPB 1000 mg/200 mL premix        1,000 mg 200 mL/hr over 60 Minutes Intravenous  Once 11/25/23 2114  11/26/23 0033       Objective: Vitals:   11/26/23 0700 11/26/23 0826  BP: (!) 97/53 (!) 92/50  Pulse: 68 69  Resp: 19 20  Temp:  98.4 F (36.9 C)  SpO2: 100% 100%    Intake/Output Summary (Last 24 hours) at 11/26/2023 1137 Last data filed at 11/26/2023 1011 Gross per 24 hour  Intake 1507.65 ml  Output --  Net 1507.65 ml   There were no vitals filed for this visit. Weight change:  There is no height or weight on file to calculate BMI.   Physical Exam: General exam: Pleasant, elderly Caucasian male. Skin: No rashes, lesions or ulcers. HEENT: Atraumatic, normocephalic, no obvious bleeding Lungs: Clear to auscultation bilaterally,   CVS: S1, S2, no murmur,   GI/Abd: Soft, nontender, nondistended, bowel sound present,   CNS: Alert, awake, oriented x 3 Psychiatry: Sad affect.  In distress from pain Extremities: Chronic changes in both legs noted.  Trace bilateral pedal edema.  Right lower extremity with chronic infection  Data Review: I have personally reviewed the laboratory data and studies available.  F/u labs ordered Unresulted Labs (From admission, onward)     Start     Ordered   11/26/23 0500  Basic metabolic panel  Daily,   R      11/26/23 0440   11/26/23 0500  CBC  Daily,   R      11/26/23 0440   11/25/23 2229  Urine Culture  Once,   R        11/25/23 2229            Signed, Chapman Rota, MD [facility] 11/26/2023

## 2023-11-26 NOTE — Progress Notes (Signed)
 Pharmacy Antibiotic Note  Harold [name] is a 68 y.o. male admitted on 11/25/2023 with generalized weakness/fatigue/AMS. Pharmacy has been consulted for vancomycin  dosing for wound infection/sepsis. PMH includes hardware infection of lower let extremity on doxycycline /levaquin  suppressive therapy (previously on ertapenem /daptomycin  till 10/31/2023)  -Cefepime /Vanc x1 -WBC WNL, lactate elevated, afebrile, continues to be hypotensive, sCr rising 2.6 > 2.8 (bl~2)  Plan: -Cefepime  2g IV every 24 hours per MD -Vancomycin  1000mg  IV every 24 hours (AUC 516, IBW, Vd 0.72, sCr 2.8) -Monitor renal function -Follow up signs of clinical improvement, LOT, de-escalation of antibiotics   Height: 6' 1 (185.4 cm) Weight: 102.5 kg (225 lb 15.5 oz) (wt from 10/10/2023) IBW/kg (Calculated) : 79.9  Temp (24hrs), Avg:98.2 F (36.8 C), Min:97.5 F (36.4 C), Max:99.1 F (37.3 C)  Recent Labs  Lab 11/25/23 2057 11/25/23 2132 11/26/23 0548 11/26/23 2015  WBC 8.0  --  7.6 7.5  CREATININE 2.56* 2.60* 2.67* 2.80*  LATICACIDVEN  --  1.9  --  2.3*    Estimated Creatinine Clearance: 30.9 mL/min (A) (by C-G formula based on SCr of 2.8 mg/dL (H)).    Allergies  Allergen Reactions   Beta-Lactamase Inhibitors (Beta-Lactam) Hives, Itching and Rash    AX - penicillin     Ancef  given 9/24 with no obvious reaction   Other Nausea And Vomiting    General Anesthesia - sometimes causes nausea and vomiting * OK to use scopolamine  patch     Wellbutrin [Bupropion] Other (See Comments)    Mood Changes   Metformin  And Related Diarrhea and Nausea Only   Sulfa Antibiotics Hives and Other (See Comments)    Bactrim   Tetracyclines & Related Hives and Rash    doxycycline     Antimicrobials this admission: Cefepime  10/7 >>  Vancomycin  10/7 >>   Microbiology results: 10/7 BCx: NG <12h 10/7 UCx: no result   Thank you for allowing pharmacy to be a part of this patient's care.  Lynwood Poplar, PharmD,  BCPS Clinical Pharmacist 11/26/2023 10:57 PM

## 2023-11-26 NOTE — ED Notes (Deleted)
 3E notified patient being transported to unit.

## 2023-11-26 NOTE — ED Notes (Signed)
 Another staff member and I changed pt's brief, bed sheets, and  provided peri-care to patient.

## 2023-11-26 NOTE — ED Notes (Addendum)
 Transport to [facility] delayed. Patient remains hypotensive after 500mL NS bolus. Segars, MD notified. See new orders. Per MD, patient remain in ED until pressure improves

## 2023-11-26 NOTE — IPAL (Addendum)
  Interdisciplinary Goals of Care Family Meeting   Date carried out: 11/26/2023  Location of the meeting: Bedside  Member's involved: Bedside Registered Nurse, Family Member or next of kin, and Other: PCCM APPs  Durable Power of Attorney or acting medical decision maker: Patient's spouse, Harold [name]    Discussion: We discussed goals of care for [name]. We discussed patient's decline since his admission in August. He is now bed bound and barely eats or drinks. He has developed multiple pressure injuries that have been oozing blood lately. We discussed that unless definitive source control of right lower extremity infection could be achieved with amputation he will likely continue to decline. Husband tells me that they have changed their minds and are now open to amputation. I explained given his comorbdities, deconditioning, and severe malnutrition it would be very difficult for him to recover from a large surgery like an amputation at this time if he were even to be a candidate. Husband understands this and would like to see what orthopedics recommends.   We discussed CPR and that if [name] were to have a cardiac arrest given his current clinical picture it would be unlikely he would be able to recover. Patient's husband wishes he remain full code and is ok with intubation if needed.   I explained patient is now requiring IV medication to support his blood pressure and if that continues to escalate he will likely need additional invasive procedures including central line and arterial line. Patient's husband tells me he has had multiple central lines including PICC lines for recent IV antibiotics and a dialysis catheter in the past. He is ok with us  placing them if he were to need it again.   Code status:   Code Status: Full Code   Disposition: Continue current acute care  Time spent for the meeting: 10 minutes    [name], PA-C  11/26/2023, 11:58 PM

## 2023-11-26 NOTE — ED Notes (Signed)
 Report received from Hellen Salvo, RN. Introduced self to patient and family. Vital signs updated. BP 76/50, MAP 59. Provider, Dahal, MD notified of MAP <65. Patient has midodrine  ordered PO but patient has been unable to swallow PO meds per report and patient family.

## 2023-11-26 NOTE — Significant Event (Signed)
 Notified by RN of persistent hypotension, had received 1L LR yesterday evening and has been on mIVF rate at 125 / hr since this morning. Was given 500 cc ~ 1930. Despite this still hypotensive 80/50s with MAP < 65. Had not received midodrine  this evening, last at 1155. I have given additional 1L of IVF, Midodrine  10 mg x 1 but despite this no significant improvement in BP. Was noted to have severe anemia and transfused 2 U RBC, Hb is stable without noted bleeding by RN staff. Blood cultures have been drawn, hx notable for chronic RLE hardware infection (treated with Daptomycin , Ertapenem  last admission, and is on suppressive Abx with Levofloxacin  and Doxycycline  outpatient). Did receive broad spectrum abx in the ED. Repeat labs show rising lactate at 2.3.   A/P:  Shock, suspect distributive I/s/o sepsis related to RLE hardware infection  -- S/p 1.5 L IVF, in addition to prior IVF throughout day, hold on additional fluids as not improving pressure.  -- Trend lactate  -- Restart on Cefepime  1 g IV q 12 hr (renal dosed) and Vancomycin  pharmacy to dose  -- F/u Blood cultures  -- PCCM consultation due to persistent hypotension.   Harold Dawson, MD  [facility]

## 2023-11-26 NOTE — Progress Notes (Signed)
 PHARMACY NOTE:  ANTIMICROBIAL RENAL DOSAGE ADJUSTMENT  Current antimicrobial regimen includes a mismatch between antimicrobial dosage and estimated renal function.  As per policy approved by the Pharmacy & Therapeutics and Medical Executive Committees, the antimicrobial dosage will be adjusted accordingly.  Current antimicrobial dosage:  Levaquin  750mg  daily  Indication: chronic infection involving hardware of the right lower extremity   Renal Function:  CrCl cannot be calculated (Unknown ideal weight.). SCr up to 2.67. Est CrCl ~ 26 ml/min    Antimicrobial dosage has been changed to:  Levaquin  750mg  q48h   Thank you for allowing pharmacy to be a part of this patient's care.  Vito Ralph, PharmD, BCPS Please see amion for complete clinical pharmacist phone list 11/26/2023 9:20 AM

## 2023-11-26 NOTE — IPAL (Incomplete)
  Interdisciplinary Goals of Care Family Meeting   Date carried out: 11/26/2023  Location of the meeting: Bedside  Member's involved: Physician, Bedside Registered Nurse, and Family Member or next of kin  Durable Power of Attorney or acting medical decision maker: Patient's spouse, Harold [name]    Discussion: We discussed goals of care for [name]. I asked patient's spouse that unless source control of right lower extremity infection could be achieved with amputation he will continue to have ongoing infection despite antibiotic therapy. Husband tells me that they have changed their minds and are now open to amputation. I explained given his comorbdities,   Code status:   Code Status: Full Code   Disposition: {IDGC Disposition:21372}  Time spent for the meeting: ***    [name], PA-C  11/26/2023, 11:58 PM

## 2023-11-26 NOTE — ED Notes (Signed)
 Patient MAP remains <65. Critical lactic result 2.3. Segars, MD notified. See new orders.

## 2023-11-26 NOTE — ED Notes (Addendum)
 Second bolus completed. MAPs remain <65. Segars, MD aware. PCCM to evaluate.

## 2023-11-26 NOTE — ED Notes (Signed)
 With assistance, pt took a few bites of Jello and drank some water. Refused anything more

## 2023-11-26 NOTE — ED Notes (Signed)
 Encouraged patient to drink fluids. Pt declining breakfast tray. Reports nausea. Will give prn nausea medication now

## 2023-11-26 NOTE — ED Notes (Signed)
 Dr.Opyd notified that this pt is expressing a lot of pain . He hurts even when I touch him. Pt is afraid that if he takes something PO he will vomit

## 2023-11-26 NOTE — H&P (Signed)
 History and Physical    Harold [name] FMW:[id number] DOB: 06-17-1955 DOA: 11/25/2023  PCP: Patient, No Pcp Per   Patient coming from: Home   Chief Complaint: General weakness, fatigue, poor appetite, anemia   HPI: Harold [name] is a 68 y.o. 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  Patient was brought in by his partner for generalized weakness, fatigue, loss of appetite, and anemia.  Patient has been bedbound, not wanting to eat or drink much, and refused to come into the hospital despite reported hemoglobin in the 5 range on outpatient blood work.  He is being treated with doxycycline  and Levaquin  for chronic hardware associated RLE infection for which amputation has been recommended but declined by the patient.  He complains of bilateral lower extremity pain that has been present for months to years.  He denies melena, hematochezia, or hematemesis.    ED Course: Upon arrival to the ED, patient is found to be afebrile and saturating well on room air with mild tachypnea, normal HR, and blood pressure as low as 81 systolic.  Labs are most notable for creatinine 2.56, albumin  1.7, normal WBC, hemoglobin 6.3, platelets 46,000, lactic acid 1.9, and fecal occult blood testing positive.  There is no acute findings on head CT or chest x-ray.    Blood and urine cultures were collected in the patient was given a liter of LR, vancomycin , cefepime , and Flagyl  in the ED. He was started on 2 unit RBC transfusion.  Review of Systems:  ROS limited by patient's clinical condition.  Past Medical History:  Diagnosis Date   Anemia    Anginal pain 04/2013   Cardiac cath showed patent stents with distal  LAD, circumflex-OM and RCA disease in small vessels.   Anxiety    Atrial fibrillation (HCC) 12/2021   CAD S/P percutaneous coronary angioplasty 2003, 04/2012   status post PCI to LAD, circumflex-OM 2, RCA   Carotid artery occlusion    Chronic renal insufficiency, stage II (mild)    Chronic venous insufficiency    varicosities, no reflux; dopplers 04/14/12- valvular insufficiency in the R and L GSV   Complication of anesthesia    COPD, mild (HCC)    Depression    situaltional    Diabetes mellitus    Diabetic neuropathy (HCC) 08/24/2019   Dyslipidemia associated with type 2 diabetes mellitus (HCC)    Fall at home, initial encounter 01/02/2023   GERD (gastroesophageal reflux disease)    History of cardioversion 12/2021   at [facility]   HTN (hypertension)    Hypothyroidism    Neuromuscular disorder (HCC)    neuropathy in feet   Neuropathy    notably improved following PCI with improved cardiac function   Obesity    Obesity, Class II, BMI 35.0-39.9, with comorbidity (see actual BMI)    BMI 39; wgt loss efforts in place; seeing Dietician   Open ankle fracture 01/02/2023   Orthostatic hypotension    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   PONV (postoperative nausea and vomiting)    [employer]   Pulmonary hypertension (HCC) 04/2012   PA pressure   Sleep apnea    uses nightly   Vitamin D  deficiency    per facility notes    Past Surgical History:  Procedure Laterality Date   ABDOMINAL AORTAGRAM N/A 11/15/2011   Procedure: ABDOMINAL EZELLA;  Surgeon: Carlin FORBES Haddock, MD;  Location: [facility] CATH LAB;  Service: Cardiovascular;  [name];   ABDOMINAL AORTOGRAM W/LOWER EXTREMITY N/A 10/13/2019   Procedure: ABDOMINAL AORTOGRAM W/LOWER EXTREMITY;  Surgeon: Darron Deatrice LABOR, MD;  Location: [facility] INVASIVE CV LAB;  Service: CV: Non-obst Ao-Iliac. R SFA-PopA patent with significant calcific stenosis of TP trunk-prox PTA (dominant) & CTO ATA  -> R PTA-TP PTCA   ABDOMINAL  AORTOGRAM W/LOWER EXTREMITY N/A 11/06/2022   Procedure: ABDOMINAL AORTOGRAM W/LOWER EXTREMITY;  Surgeon: Darron Deatrice LABOR, MD;  Location: [facility] INVAS[facility] CV LAB;  Service: Cardiovascular;  [name];   ABDOMINAL AORTOGRAM W/LOWER EXTREMITY Right 04/01/2023   Procedure: ABDOMINAL AORTOGRAM W/LOWER EXTREMITY;  Surgeon: Serene Gaile ORN, MD;  Location: [facility] INVAS[facility] CV LAB;  Service: Cardiovascular;  Laterality: Right;   ABIs  04/27/2012   mild bilateral arterial insufficiency   ANKLE FUSION Right 05/15/2023   Procedure: ARTHRODESIS ANKLE WITH HINDFOOT FUSION NAIL;  Surgeon: Kendal Franky SQUIBB, MD;  [name];  Service: Orthopedics;  Laterality: Right;   APPLICATION OF WOUND VAC Right 03/31/2023   Procedure: APPLICATION OF WOUND VAC;  Surgeon: Kendal Franky SQUIBB, MD;  [name];  Service: Orthopedics;  Laterality: Right;   BACK SURGERY  2005 x1   X2-2010   BUNIONECTOMY Right 11/11/2013   Procedure: RIGHT FOOT SILVER BUNIONECTOMY;  Surgeon: Norleen Armor, MD;  Location:  SURGERY CENTER;  Service: Orthopedics;  Laterality: Right;   CARDIAC CATHETERIZATION  12/11/2001   significant 3V CAD, normal LV function   CARDIOVERSION N/A 03/13/2022   Procedure: CARDIOVERSION;  Surgeon: Kate Lonni CROME, MD;  Location: [facility] ENDOSCOPY;  Service: Cardiovascular;  [name];   CARDIOVERSION N/A 07/25/2022   Procedure: CARDIOVERSION;  Surgeon: Jeffrie Oneil BROCKS, MD;  [facility];  Service: Cardiovascular;  [name];   Carotid Doppler  04/27/2012   right internal carotid: Elevated velocities but no evidence of plaque. Left internal carotid 40-59%   CAROTID ENDARTERECTOMY  2005   Right; recent carotid Dopplers notes elevated velocities.    colonscopy     CORONARY ANGIOPLASTY  12/21/2001   PTCA of the distal and mid AV groove circ, unsuccessful PTCA of second OM total occlusion, unsuccessful PTCA of the apical LAD total occlusion   CORONARY ANGIOPLASTY WITH STENT PLACEMENT   04/29/2012   PCI to 3 RCA lesions, Promus Premiere 2.60mmx8mm distally, mid was 2.10mx28mm and proximally 2.75x109mm, EF 55-60%   CORONARY STENT PLACEMENT  04/28/2012   PCI to LAD (3x63mm Xience DES postdilated to 3.25) and circ prox and mid (2 overlappinmg 2.39mmx12mmXience DES postdilated to 2.46mm)   DOPPLER ECHOCARDIOGRAPHY  04/28/2012   poor quality study: EF estimated 60-65%; unable to assess diastolic function (previously noted to have diastolic dysfunction); severely dilated left atrium and mild right atrium; dilated IVC consistent with increased central venous pressure.SABRA   EXTERNAL FIXATION LEG Right 05/12/2023   Procedure: EXTERNAL FIXATION, LOWER EXTREMITY;  Surgeon: Kendal Franky SQUIBB, MD;  [name];  Service: Orthopedics;  Laterality: Right;   HARDWARE REMOVAL Right 03/31/2023   Procedure: HARDWARE REMOVAL RIGHT ANKLE;  Surgeon: Kendal Franky SQUIBB, MD;  [name];  Service: Orthopedics;  Laterality: Right;   HARDWARE REMOVAL Right 01/28/2023   Procedure: REMOVAL OF HARDWARE RIGHT ANKLE;  [name], MD;  [name];  Service: Orthopedics;  Laterality: Right;   HARDWARE REMOVAL Right 06/21/2023   Procedure:  REMOVAL, HARDWARE;  Surgeon: Kendal Franky SQUIBB, MD;  [name];  Service: Orthopedics;  Laterality: Right;   HERNIA REPAIR     I & D EXTREMITY Right 01/02/2023   Procedure: IRRIGATION AND DEBRIDEMENT RIGHT ANKLE;  [name], MD;  [name];  Service: Orthopedics;  Laterality: Right;   I & D EXTREMITY Right 04/02/2023   Procedure: DEBRIDEMENT RIGHT ANKLE;  Surgeon: Harden Jerona GAILS, MD;  Location: [facility] OR;  Service: Orthopedics;  Laterality: Right;   INCISION AND DRAINAGE OF WOUND Right 09/19/2023   Procedure: IRRIGATION AND DEBRIDEMENT WOUND;  Surgeon: Kendal Franky SQUIBB, MD;  [name];  Service: Orthopedics;  Laterality: Right;   IR FLUORO GUIDE CV LINE LEFT  04/22/2023   IR FLUORO GUIDE CV LINE RIGHT  04/07/2023   IR PATIENT EVAL TECH 0-60  MINS  04/07/2023   IR RADIOLOGIST EVAL & MGMT  04/08/2023   IR REMOVAL TUN CV CATH W/O FL  05/05/2023   IR US  GUIDE VASC ACCESS LEFT  04/22/2023   IR US  GUIDE VASC ACCESS RIGHT  04/07/2023   LEFT AND RIGHT HEART CATHETERIZATION WITH CORONARY ANGIOGRAM N/A 04/27/2012   Procedure: LEFT AND RIGHT HEART CATHETERIZATION WITH CORONARY ANGIOGRAM;  [name], MD;  Location: [facility] CATH LAB;  Service: Cardiovascular;  [name];   LEFT AND RIGHT HEART CATHETERIZATION WITH CORONARY ANGIOGRAM N/A 05/14/2013   Procedure: LEFT AND RIGHT HEART CATHETERIZATION WITH CORONARY ANGIOGRAM;  [name], MD;  [name]; PCWP ;; [name], [name], Patent RCA ostial mid and distal stents - 70% distal runoff Dz   LUMBAR LAMINECTOMY/DECOMPRESSION MICRODISCECTOMY  01/07/2012   Procedure: LUMBAR LAMINECTOMY/DECOMPRESSION MICRODISCECTOMY 1 LEVEL;  [name], MD;  [name];  Service: Neurosurgery;  Laterality: Bilateral;   Lumbar Three-Four Decompression   LUMBAR LAMINECTOMY/DECOMPRESSION MICRODISCECTOMY N/A 07/26/2020   Procedure: Lumbar two-three Laminectomy/Foraminotomy;  [name], MD;  [name];  Service: Neurosurgery;  [name];   ORIF ANKLE FRACTURE Right 01/02/2023   Procedure: OPEN REDUCTION INTERNAL FIXATION (ORIF)RIGHT ANKLE FRACTURE;  [name], MD;  [name];  Service: Orthopedics;  Laterality: Right;   PERCUTANEOUS CORONARY STENT INTERVENTION (PCI-S) N/A 04/28/2012   Procedure: PERCUTANEOUS CORONARY STENT INTERVENTION (PCI-S);  [name], MD;  Location: [facility] CATH LAB;  Service: Cardiovascular;  [name];   PERCUTANEOUS CORONARY STENT INTERVENTION (PCI-S) N/A 04/29/2012   Procedure: PERCUTANEOUS CORONARY STENT INTERVENTION (PCI-S);  [name], MD;  Location: [facility] CATH LAB;  Service: Cardiovascular;  [name];   PERIPHERAL VASCULAR ATHERECTOMY  11/06/2022   Procedure: PERIPHERAL VASCULAR ATHERECTOMY;  Surgeon: Darron Deatrice DELENA, MD;  [facility];  Service: Cardiovascular;;   PERIPHERAL VASCULAR BALLOON ANGIOPLASTY Right 10/13/2019   Procedure: PERIPHERAL VASCULAR BALLOON ANGIOPLASTY;  Surgeon: Darron Deatrice DELENA, MD;  [facility];  Service: Cardiovascular;  Laterality: Right;  PTCA of R TP Trunk into PTA (Drug-coated) => Follow-up ABIs 0.9 on the right and 0.99 on the left.   PERIPHERAL VASCULAR BALLOON ANGIOPLASTY Right 04/01/2023   Procedure: PERIPHERAL VASCULAR BALLOON ANGIOPLASTY;  Surgeon: Serene Gaile LELON, MD;  [facility];  Service: Cardiovascular;  Laterality: Right;   PERIPHERAL VASCULAR INTERVENTION Right 04/01/2023   Procedure: PERIPHERAL VASCULAR INTERVENTION;  Surgeon:  Serene Gaile ORN, MD;  [facility];  Service: Cardiovascular;  Laterality: Right;   SPINE SURGERY     UMBILICAL HERNIA REPAIR  2009   steel mesh insert    Social History:   reports that he quit smoking about 20 years ago. His smoking use included cigarettes. He started smoking about 62 years ago. He has a 42 pack-year smoking history. He has never used smokeless tobacco. He reports that he does not drink alcohol and does not use drugs.  Allergies  Allergen Reactions   Beta-Lactamase Inhibitors (Beta-Lactam) Hives, Itching and Rash    AX - penicillin     Ancef  given 9/24 with no obvious reaction   Other Nausea And Vomiting    General Anesthesia - sometimes causes nausea and vomiting * OK to use scopolamine  patch     Wellbutrin [Bupropion] Other (See Comments)    Mood Changes   Metformin  And Related Diarrhea and Nausea Only   Sulfa Antibiotics Hives and Other (See Comments)    Bactrim   Tetracyclines & Related Hives and Rash    doxycycline     Family History  Adopted:  Yes  Family history unknown: Yes     Prior to Admission medications   Medication Sig Start Date End Date Taking? Authorizing Provider  allopurinol  (ZYLOPRIM ) 100 MG tablet Take 0.5 tablets (50 mg total) by mouth daily. 05/10/23   Vernon Ranks, MD  amiodarone  (PACERONE ) 200 MG tablet Take 1 tablet (200 mg total) by mouth 2 (two) times daily. Patient not taking: No sig reported 05/10/23   Vernon Ranks, MD  apixaban  (ELIQUIS ) 5 MG TABS tablet TAKE 1 TABLET BY MOUTH TWICE A DAY 02/17/23   Anner Alm ORN, MD  atorvastatin  (LIPITOR) 40 MG tablet Take 1 tablet (40 mg total) by mouth daily. 05/10/23 03/02/24  Vernon Ranks, MD  clopidogrel  (PLAVIX ) 75 MG tablet Take 75 mg by mouth every morning. 02/04/23   [provider]  doxycycline  (VIBRA -TABS) 100 MG tablet Take 1 tablet (100 mg total) by mouth 2 (two) times daily. 10/15/23   Kuppelweiser, Cassie L, RPH-CPP  DULoxetine  (CYMBALTA ) 60 MG capsule Take 60 mg by mouth daily.    [provider]  ezetimibe  (ZETIA ) 10 MG tablet Take 10 mg by mouth every evening. 05/09/22   [provider]  feeding supplement (ENSURE PLUS HIGH PROTEIN) LIQD Take 237 mLs by mouth 2 (two) times daily between meals. 08/09/23   Ezenduka, Nkeiruka J, MD  ferrous sulfate  325 (65 FE) MG tablet Take 325 mg by mouth 3 (three) times a week. Monday, Wednesday, Friday 06/08/23   [provider]  gabapentin  (NEURONTIN ) 300 MG capsule Take 1 capsule (300 mg total) by mouth at bedtime. 05/10/23   Pahwani, Ravi, MD  HYDROcodone -acetaminophen  (NORCO/VICODIN) 5-325 MG tablet Take 1 tablet by mouth every 6 (six) hours as needed for moderate pain (pain score 4-6). 09/22/23   Charmayne Holmes, DO  hydrocortisone  cream 1 % Apply topically 3 (three) times daily. 09/19/23   Charmayne Holmes, DO  insulin  glargine (LANTUS  SOLOSTAR) 100 UNIT/ML Solostar Pen Inject 8 Units into the skin at bedtime. 09/22/23   Charmayne Holmes, DO  insulin  lispro (HUMALOG) 100 UNIT/ML injection  Inject 0-15 Units into the skin in the morning, at noon, in the evening, and at bedtime. Per sliding scale: 0-70= 0 units if <70 implement hypoglycemic protocol & notify MD/NP 71-150= 0 units 151-200=3 units 201-250= 6 units 251-300= 9 units 301-350= 12 units 351-400= 15 units 401-999 if  CBG>400. Notify MD/ NP,    [provider]  levofloxacin  (LEVAQUIN ) 750 MG tablet Take 1 tablet (750 mg total) by mouth daily. 10/15/23   Kuppelweiser, Cassie L, RPH-CPP  levothyroxine  (SYNTHROID , LEVOTHROID) 88 MCG tablet Take 88 mcg by mouth daily before breakfast.    [provider]  losartan  (COZAAR ) 25 MG tablet Take 25 mg by mouth daily.    [provider]  midodrine  (PROAMATINE ) 5 MG tablet Take 5 mg by mouth every 8 (eight) hours as needed (SBP < 110).    [provider]  nitroGLYCERIN  (NITROSTAT ) 0.4 MG SL tablet PLACE 1 TAB UNDER THE TONGUE EVERY 5 MINUTES AS NEEDED FOR CHEST PAIN (SEVERE PRESSURE OR TIGHTNESS) 02/22/19   Anner Alm ORN, MD  ondansetron  (ZOFRAN ) 4 MG tablet Take 1 tablet (4 mg total) by mouth every 8 (eight) hours as needed for nausea or vomiting. 09/22/23   Charmayne Holmes, DO  polyethylene glycol (MIRALAX  / GLYCOLAX ) 17 g packet Take 17 g by mouth daily as needed for mild constipation. 06/28/23   Rosario Eland I, MD  tamsulosin  (FLOMAX ) 0.4 MG CAPS capsule Take 0.4 mg by mouth.    [provider]  tirzepatide  (MOUNJARO ) 15 MG/0.5ML Pen Inject 15 mg into the skin once a week. 12/30/22     traMADol (ULTRAM) 50 MG tablet Take 50 mg by mouth every 6 (six) hours as needed for moderate pain (pain score 4-6).    [provider]  Vitamin D , Ergocalciferol , (DRISDOL ) 1.25 MG (50000 UNIT) CAPS capsule Take 1 capsule (50,000 Units total) by mouth every 7 (seven) days. 05/27/23   Lue Elsie BROCKS, MD    Physical Exam: Vitals:   11/26/23 0130 11/26/23 0250 11/26/23 0326 11/26/23 0346  BP: (!) 105/55 (!) 112/58 106/71 (!) 100/54  Pulse:  73 73 68 74  Resp: 20 (!) 23 19 (!) 26  Temp:  98.1 F (36.7 C) 98.4 F (36.9 C) 98.4 F (36.9 C)  TempSrc:  Axillary Axillary Axillary  SpO2: 100% 100% 100% 100%    Constitutional: NAD, pale, chronically-ill appearance  Eyes: PERTLA, lids and conjunctivae normal ENMT: Mucous membranes are moist. Posterior pharynx clear of any exudate or lesions.   Neck: supple, no masses  Respiratory: no wheezing, no crackles. No accessory muscle use.  Cardiovascular: S1 & S2 heard, regular rate and rhythm. No JVD.  Abdomen: No tenderness, soft. Bowel sounds active.  Musculoskeletal: no clubbing / cyanosis. No joint deformity upper and lower extremities.   Skin: Chronic wounds and hyperpigmentation involving lower legs b/l. Pale, dry, well-perfused. Neurologic: CN 2-12 grossly intact. Moving all extremities. Somnolent, wakes to voice and is oriented to person, place, and situation.  Psychiatric: Calm. Cooperative.    Labs and Imaging on Admission: I have personally reviewed following labs and imaging studies  CBC: Recent Labs  Lab 11/25/23 2057 11/25/23 2132  WBC 8.0  --   HGB 6.3* 6.1*  HCT 18.7* 18.0*  MCV 88.6  --   PLT 46*  --    Basic Metabolic Panel: Recent Labs  Lab 11/25/23 2057 11/25/23 2119 11/25/23 2132  NA 137  --  139  K 3.3*  --  3.3*  CL 105  --  106  CO2 17*  --   --   GLUCOSE 121*  --  110*  BUN 40*  --  33*  CREATININE 2.56*  --  2.60*  CALCIUM  7.5*  --   --   MG  --  1.5*  --  GFR: CrCl cannot be calculated (Unknown ideal weight.). Liver Function Tests: Recent Labs  Lab 11/25/23 2057  AST 16  ALT 8  ALKPHOS 83  BILITOT 1.3*  PROT 4.3*  ALBUMIN  1.7*   No results for input(s): LIPASE, AMYLASE in the last 168 hours. No results for input(s): AMMONIA in the last 168 hours. Coagulation Profile: No results for input(s): INR, PROTIME in the last 168 hours. Cardiac Enzymes: No results for input(s): CKTOTAL, CKMB, CKMBINDEX, TROPONINI  in the last 168 hours. BNP (last 3 results) No results for input(s): PROBNP in the last 8760 hours. HbA1C: No results for input(s): HGBA1C in the last 72 hours. CBG: No results for input(s): GLUCAP in the last 168 hours. Lipid Profile: No results for input(s): CHOL, HDL, LDLCALC, TRIG, CHOLHDL, LDLDIRECT in the last 72 hours. Thyroid  Function Tests: No results for input(s): TSH, T4TOTAL, FREET4, T3FREE, THYROIDAB in the last 72 hours. Anemia Panel: Recent Labs    11/25/23 2057 11/25/23 2058 11/25/23 2119  VITAMINB12  --   --  487  FOLATE  --  4.4*  --   FERRITIN 446*  --   --   TIBC NOT CALCULATED  --   --   IRON 65  --   --   RETICCTPCT  --  0.7  --    Urine analysis:    Component Value Date/Time   COLORURINE YELLOW 11/25/2023 2229   APPEARANCEUR CLOUDY (A) 11/25/2023 2229   LABSPEC 1.013 11/25/2023 2229   PHURINE 5.0 11/25/2023 2229   GLUCOSEU NEGATIVE 11/25/2023 2229   HGBUR NEGATIVE 11/25/2023 2229   BILIRUBINUR NEGATIVE 11/25/2023 2229   BILIRUBINUR negative 05/12/2018 1213   BILIRUBINUR small 06/29/2014 0859   KETONESUR NEGATIVE 11/25/2023 2229   PROTEINUR 100 (A) 11/25/2023 2229   UROBILINOGEN 0.2 05/12/2018 1213   UROBILINOGEN 1.0 05/02/2014 2015   NITRITE NEGATIVE 11/25/2023 2229   LEUKOCYTESUR SMALL (A) 11/25/2023 2229   Sepsis Labs: @LABRCNTIP (procalcitonin:4,lacticidven:4) ) Recent Results (from the past 240 hours)  Resp panel by RT-PCR (RSV, Flu A&B, Covid) Anterior Nasal Swab     Status: None   Collection Time: 11/25/23  9:13 PM   Specimen: Anterior Nasal Swab  Result Value Ref Range Status   SARS Coronavirus 2 by RT PCR NEGATIVE NEGATIVE Final   Influenza A by PCR NEGATIVE NEGATIVE Final   Influenza B by PCR NEGATIVE NEGATIVE Final    Comment: (NOTE) The Xpert Xpress SARS-CoV-2/FLU/RSV plus assay is intended as an aid in the diagnosis of influenza from Nasopharyngeal swab specimens and should not be used as a sole  basis for treatment. Nasal washings and aspirates are unacceptable for Xpert Xpress SARS-CoV-2/FLU/RSV testing.  Fact Sheet for Patients: BloggerCourse.com  Fact Sheet for Healthcare Providers: SeriousBroker.it  This test is not yet approved or cleared by the [place]  FDA and has been authorized for detection and/or diagnosis of SARS-CoV-2 by FDA under an Emergency Use Authorization (EUA). This EUA will remain in effect (meaning this test can be used) for the duration of the COVID-19 declaration under Section 564(b)(1) of the Act, 21 U.S.C. section 360bbb-3(b)(1), unless the authorization is terminated or revoked.     Resp Syncytial Virus by PCR NEGATIVE NEGATIVE Final    Comment: (NOTE) Fact Sheet for Patients: BloggerCourse.com  Fact Sheet for Healthcare Providers: SeriousBroker.it  This test is not yet approved or cleared by the [place]  FDA and has been authorized for detection and/or diagnosis of SARS-CoV-2 by FDA under an Emergency Use Authorization (EUA). This EUA  will remain in effect (meaning this test can be used) for the duration of the COVID-19 declaration under Section 564(b)(1) of the Act, 21 U.S.C. section 360bbb-3(b)(1), unless the authorization is terminated or revoked.  Performed at [facility], [address] [place], [place] [address]      Radiological Exams on Admission: CT Head Wo Contrast Result Date: 11/25/2023 EXAM: CT HEAD WITHOUT CONTRAST 11/25/2023 10:05:33 PM TECHNIQUE: CT of the head was performed without the administration of intravenous contrast. Automated exposure control, iterative reconstruction, and/or weight based adjustment of the mA/kV was utilized to reduce the radiation dose to as low as reasonably achievable. COMPARISON: None available. CLINICAL HISTORY: Mental status change, unknown cause. FINDINGS: BRAIN AND  VENTRICLES: Moderate global parenchymal volume loss is slightly advanced in relation to the patient's stated age. Mild periventricular white matter changes are present, likely reflecting small vessel ischemia. Ventricular size is normal and commensurate with a degree of parenchymal volume loss. No acute hemorrhage. No evidence of acute infarct. No hydrocephalus. No extra-axial collection. No mass effect or midline shift. ORBITS: No acute abnormality. SINUSES: No acute abnormality. SOFT TISSUES AND SKULL: No acute soft tissue abnormality. No skull fracture. IMPRESSION: 1. No acute intracranial abnormality. 2. Moderate global parenchymal volume loss, slightly advanced for age. Electronically signed by: Dorethia Molt MD 11/25/2023 10:10 PM EDT RP Workstation: [id number]   DG Chest Port 1 View Result Date: 11/25/2023 EXAM: 1 VIEW XRAY OF THE CHEST 11/25/2023 09:28:00 PM COMPARISON: None available. CLINICAL HISTORY: weakness. Pt bib [facility] coming from home with CC of generalized weakness. Last week, patient has had increase in AMS. Pt A\T\Ox3 but A\T\Ox4 at baseline. Blood work was done last week with hemoglobin of 5 but patient did not get treatment for this. Pt has swelling to ; left arm. GCS 14. weakness. Pt bib [facility] coming from home with CC of generalized weakness. Last week, patient has had increase in AMS. Pt A\T\Ox3 but A\T\Ox4 at baseline. Blood work was done last week with hemoglobin of 5 but patient did not get treatment for this. Pt has swelling to ; left arm. GCS 14. FINDINGS: LUNGS AND PLEURA: Pulmonary hyperinflation. No pulmonary opacity. No pleural effusion. No pneumothorax. HEART AND MEDIASTINUM: Cardiac size within normal limits. BONES AND SOFT TISSUES: No acute bone abnormality. Bony vascularity is normal. IMPRESSION: 1. No acute process. 2. Pulmonary hyperinflation. Electronically signed by: Dorethia Molt MD 11/25/2023 09:32 PM EDT RP Workstation: [id number]    EKG: Independently reviewed. Sinus  rhythm, LBBB.   Assessment/Plan   1. Acute on chronic anemia; thrombocytopenia  - Hgb is 6.3, down from 7.3 in August 2025; stool appears normal per ED physician but FOBT is positive  - Platelets are 46,000, previously normal  - 2 units RBC are transfusing in ED  - Request smear review and anemia panel from earlier blood draw prior to transfusions, hold Eliquis  for now, repeat CBC after transfusion   2. AKI superimposed on CKD 3B - SCr is 2.56, up from apparent baseline of ~1.7    - IVF and RBC transfusion given in ED  - Hold losartan , renally-dose medications, repeat chem panel in am   3. Chronic hardware-associated RLE infection  - No evidence for acute infection  - Continue doxycycline  and Levaquin     4. CAD  - No anginal complaints  - Continue Lipitor    5. PAF  - Hold Eliquis  in light of occult GI bleeding and symptomatic anemia    6. COPD; OSA   -  Not in exacerbation   - CPAP while sleeping   7. Type II DM  - A1c was only 5.2% in May 2025  - Check CBGs and use low-intensity SSI if needed    8. Hypokalemia  - Replacing, will repeat chem panel    DVT prophylaxis: SCDs  Code Status: Full  Level of Care: Level of care: Progressive Family Communication: Partner updated from ED Disposition Plan:  Patient is from: Home  Anticipated d/c is to: TBD Anticipated d/c date is: 11/29/23  Patient currently: Pending stable blood counts and renal function, disposition planning  Consults called: None  Admission status: Inpatient     Evalene GORMAN Sprinkles, MD [facility]  11/26/2023, 4:41 AM

## 2023-11-26 NOTE — Consult Note (Signed)
 Reason for Consult: Chronic iron deficiency anemia.  Referring Physician: [facility] hospital[facility].  DEONTAE ROBSON is an 68 y.o. male.  HPI: Patient is a 68 year old white male with multiple medical problems listed below who was brought in the emergency room yesterday with generalized weakness, fatigue, altered mental status and anorexia.  He has chronic iron deficiency anemia and was advised to come to the hospital when he had a hemoglobin of 5 g/dL recently but he refused however as his general situation got progressively worse his husband brought him to the emergency room for further treatment evaluation. In the emergency room he was found to have a hemoglobin of 6.1 g/dL with a platelet count of 46,000 along with a creatinine of 2.56 and albumin  of 1.57. He received 2 units of packed red blood cells along with vancomycin  cefepime  and Flagyl  in the ED.  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  He denies having any problem with melena or hematochezia.  His appetite is poor and his weight has been stable. His last colonoscopy was done on 05/20/2019 with diverticulosis noted in the throughout the colon tubular adenomas were removed.  He has been quite hypertensive with systolic blood pressures in the 80s to 100s.  As per his nurse his blood pressure drops with change of position into the 60s systolic.  Past Medical History:  Diagnosis Date   Anemia    Anginal pain 04/2013   Cardiac cath showed patent stents with distal LAD, circumflex-OM and RCA disease in small vessels.   Anxiety    Atrial fibrillation (HCC) 12/2021   CAD S/P percutaneous coronary angioplasty 2003, 04/2012   status post PCI to LAD, circumflex-OM 2, RCA   Carotid artery occlusion    Chronic renal insufficiency, stage II (mild)    Chronic venous insufficiency    varicosities, no reflux; dopplers 04/14/12-  valvular insufficiency in the R and L GSV   Complication of anesthesia    COPD, mild (HCC)    Depression    situaltional    Diabetes mellitus    Diabetic neuropathy (HCC) 08/24/2019   Dyslipidemia associated with type 2 diabetes mellitus (HCC)    Fall at home, initial encounter 01/02/2023   GERD (gastroesophageal reflux disease)    History of cardioversion 12/2021   at [facility]   HTN (hypertension)    Hypothyroidism    Neuromuscular disorder (HCC)    neuropathy in feet   Neuropathy    notably improved following PCI with improved cardiac function   Obesity    Obesity, Class II, BMI 35.0-39.9, with comorbidity (see actual BMI)    BMI 39; wgt loss efforts in place; seeing Dietician   Open ankle fracture 01/02/2023   Orthostatic hypotension    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   PONV (postoperative nausea and vomiting)    [employer] [place]   Pulmonary hypertension (HCC) 04/2012   PA pressure   Sleep apnea    uses nightly   Vitamin D  deficiency    per facility notes   Past Surgical History:  Procedure Laterality Date   ABDOMINAL AORTAGRAM N/A 11/15/2011   Procedure: ABDOMINAL EZELLA;  Surgeon: Carlin FORBES Haddock, MD;  Location: [facility] CATH LAB;  Service: Cardiovascular;  [name];   ABDOMINAL AORTOGRAM W/LOWER EXTREMITY N/A 10/13/2019   Procedure: ABDOMINAL AORTOGRAM W/LOWER EXTREMITY;  Surgeon: Darron Deatrice LABOR, MD;  [facility] INVASIVE CV  LAB;  Service: CV: Non-obst Ao-Iliac. R SFA-PopA patent with significant calcific stenosis of TP trunk-prox PTA (dominant) & CTO ATA  -> R PTA-TP PTCA   ABDOMINAL AORTOGRAM W/LOWER EXTREMITY N/A 11/06/2022   Procedure: ABDOMINAL AORTOGRAM W/LOWER EXTREMITY;  Surgeon: Darron Deatrice LABOR, MD;  [facility];  Service: Cardiovascular;  [name];   ABDOMINAL AORTOGRAM W/LOWER EXTREMITY Right 04/01/2023   Procedure: ABDOMINAL AORTOGRAM W/LOWER EXTREMITY;  Surgeon: Serene Gaile ORN, MD;  [facility]  LAB;  Service: Cardiovascular;  Laterality: Right;   ABIs  04/27/2012   mild bilateral arterial insufficiency   ANKLE FUSION Right 05/15/2023   Procedure: ARTHRODESIS ANKLE WITH HINDFOOT FUSION NAIL;  Surgeon: Kendal Franky SQUIBB, MD;  [name];  Service: Orthopedics;  Laterality: Right;   APPLICATION OF WOUND VAC Right 03/31/2023   Procedure: APPLICATION OF WOUND VAC;  Surgeon: Kendal Franky SQUIBB, MD;  [name];  Service: Orthopedics;  Laterality: Right;   BACK SURGERY  2005 x1   X2-2010   BUNIONECTOMY Right 11/11/2013   Procedure: RIGHT FOOT SILVER BUNIONECTOMY;  Surgeon: Norleen Armor, MD;  Location: [facility];  Service: Orthopedics;  Laterality: Right;   CARDIAC CATHETERIZATION  12/11/2001   significant 3V CAD, normal LV function   CARDIOVERSION N/A 03/13/2022   Procedure: CARDIOVERSION;  Surgeon: Kate Lonni CROME, MD;  Location: [facility] ENDOSCOPY;  Service: Cardiovascular;  [name];   CARDIOVERSION N/A 07/25/2022   Procedure: CARDIOVERSION;  Surgeon: Jeffrie Oneil BROCKS, MD;  [facility];  Service: Cardiovascular;  [name];   Carotid Doppler  04/27/2012   right internal carotid: Elevated velocities but no evidence of plaque. Left internal carotid 40-59%   CAROTID ENDARTERECTOMY  2005   Right; recent carotid Dopplers notes elevated velocities.    colonscopy     CORONARY ANGIOPLASTY  12/21/2001   PTCA of the distal and mid AV groove circ, unsuccessful PTCA of second OM total occlusion, unsuccessful PTCA of the apical LAD total occlusion   CORONARY ANGIOPLASTY WITH STENT PLACEMENT  04/29/2012   PCI to 3 RCA lesions, Promus Premiere 2.71mmx8mm distally, mid was 2.47mx28mm and proximally 2.75x73mm, EF 55-60%   CORONARY STENT PLACEMENT  04/28/2012   PCI to LAD (3x23mm Xience DES postdilated to 3.25) and circ prox and mid (2 overlappinmg 2.82mmx12mmXience DES postdilated to 2.39mm)   DOPPLER ECHOCARDIOGRAPHY  04/28/2012   poor quality study: EF  estimated 60-65%; unable to assess diastolic function (previously noted to have diastolic dysfunction); severely dilated left atrium and mild right atrium; dilated IVC consistent with increased central venous pressure.SABRA   EXTERNAL FIXATION LEG Right 05/12/2023   Procedure: EXTERNAL FIXATION, LOWER EXTREMITY;  Surgeon: Kendal Franky SQUIBB, MD;  [name];  Service: Orthopedics;  Laterality: Right;   HARDWARE REMOVAL Right 03/31/2023   Procedure: HARDWARE REMOVAL RIGHT ANKLE;  Surgeon: Kendal Franky SQUIBB, MD;  [name];  Service: Orthopedics;  Laterality: Right;   HARDWARE REMOVAL Right 01/28/2023   Procedure: REMOVAL OF HARDWARE RIGHT ANKLE;  [name], MD;  [name];  Service: Orthopedics;  Laterality: Right;   HARDWARE REMOVAL Right 06/21/2023   Procedure: REMOVAL, HARDWARE;  Surgeon: Kendal Franky SQUIBB, MD;  [name];  Service: Orthopedics;  Laterality: Right;   HERNIA REPAIR     I & D EXTREMITY Right 01/02/2023   Procedure: IRRIGATION AND DEBRIDEMENT RIGHT ANKLE;  [name], MD;  [name];  Service: Orthopedics;  Laterality: Right;  I & D EXTREMITY Right 04/02/2023   Procedure: DEBRIDEMENT RIGHT ANKLE;  Surgeon: Harden Jerona GAILS, MD;  Location: [facility] OR;  Service: Orthopedics;  Laterality: Right;   INCISION AND DRAINAGE OF WOUND Right 09/19/2023   Procedure: IRRIGATION AND DEBRIDEMENT WOUND;  Surgeon: Kendal Franky SQUIBB, MD;  [name];  Service: Orthopedics;  Laterality: Right;   IR FLUORO GUIDE CV LINE LEFT  04/22/2023   IR FLUORO GUIDE CV LINE RIGHT  04/07/2023   IR PATIENT EVAL TECH 0-60 MINS  04/07/2023   IR RADIOLOGIST EVAL & MGMT  04/08/2023   IR REMOVAL TUN CV CATH W/O FL  05/05/2023   IR US  GUIDE VASC ACCESS LEFT  04/22/2023   IR US  GUIDE VASC ACCESS RIGHT  04/07/2023   LEFT AND RIGHT HEART CATHETERIZATION WITH CORONARY ANGIOGRAM N/A 04/27/2012   Procedure: LEFT AND RIGHT HEART CATHETERIZATION WITH CORONARY ANGIOGRAM;  [name],  MD;  Location: [facility] CATH LAB;  Service: Cardiovascular;  [name];   LEFT AND RIGHT HEART CATHETERIZATION WITH CORONARY ANGIOGRAM N/A 05/14/2013   Procedure: LEFT AND RIGHT HEART CATHETERIZATION WITH CORONARY ANGIOGRAM;  [name], MD;  [name]; PCWP ;; [name], [name], Patent RCA ostial mid and distal stents - 70% distal runoff Dz   LUMBAR LAMINECTOMY/DECOMPRESSION MICRODISCECTOMY  01/07/2012   Procedure: LUMBAR LAMINECTOMY/DECOMPRESSION MICRODISCECTOMY 1 LEVEL;  [name], MD;  [name];  Service: Neurosurgery;  Laterality: Bilateral;   Lumbar Three-Four Decompression   LUMBAR LAMINECTOMY/DECOMPRESSION MICRODISCECTOMY N/A 07/26/2020   Procedure: Lumbar two-three Laminectomy/Foraminotomy;  [name], MD;  [name];  Service: Neurosurgery;  [name];   ORIF ANKLE FRACTURE Right 01/02/2023   Procedure: OPEN REDUCTION INTERNAL FIXATION (ORIF)RIGHT ANKLE FRACTURE;  [name], MD;  [name];  Service: Orthopedics;  Laterality: Right;   PERCUTANEOUS CORONARY STENT INTERVENTION (PCI-S) N/A 04/28/2012   Procedure: PERCUTANEOUS CORONARY STENT INTERVENTION (PCI-S);  [name], MD;  Location: [facility] CATH LAB;  Service: Cardiovascular;  [name];   PERCUTANEOUS CORONARY STENT INTERVENTION (PCI-S) N/A 04/29/2012   Procedure: PERCUTANEOUS CORONARY STENT INTERVENTION (PCI-S);  [name], MD;  Location: [facility] CATH LAB;  Service: Cardiovascular;  [name];   PERIPHERAL VASCULAR ATHERECTOMY  11/06/2022   Procedure: PERIPHERAL VASCULAR ATHERECTOMY;  Surgeon: Darron Deatrice DELENA, MD;  [facility];  Service: Cardiovascular;;   PERIPHERAL VASCULAR BALLOON ANGIOPLASTY Right 10/13/2019   Procedure: PERIPHERAL VASCULAR BALLOON  ANGIOPLASTY;  Surgeon: Darron Deatrice DELENA, MD;  [facility];  Service: Cardiovascular;  Laterality: Right;  PTCA of R TP Trunk into PTA (Drug-coated) => Follow-up ABIs 0.9 on the right and 0.99 on the left.   PERIPHERAL VASCULAR BALLOON ANGIOPLASTY Right 04/01/2023   Procedure: PERIPHERAL VASCULAR BALLOON ANGIOPLASTY;  Surgeon: Serene Gaile LELON, MD;  [facility];  Service: Cardiovascular;  Laterality: Right;   PERIPHERAL VASCULAR INTERVENTION Right 04/01/2023   Procedure: PERIPHERAL VASCULAR INTERVENTION;  Surgeon: Serene Gaile LELON, MD;  [facility];  Service: Cardiovascular;  Laterality: Right;   SPINE SURGERY     UMBILICAL HERNIA REPAIR  2009   steel mesh insert   Family History  Adopted: Yes  Family history unknown: Yes   Social History:  reports that he quit smoking about  20 years ago. His smoking use included cigarettes. He started smoking about 62 years ago. He has a 42 pack-year smoking history. He has never used smokeless tobacco. He reports that he does not drink alcohol and does not use drugs.  Allergies:  Allergies  Allergen Reactions   Beta-Lactamase Inhibitors (Beta-Lactam) Hives, Itching and Rash    AX - penicillin     Ancef  given 9/24 with no obvious reaction   Other Nausea And Vomiting    General Anesthesia - sometimes causes nausea and vomiting * OK to use scopolamine  patch     Wellbutrin [Bupropion] Other (See Comments)    Mood Changes   Metformin  And Related Diarrhea and Nausea Only   Sulfa Antibiotics Hives and Other (See Comments)    Bactrim   Tetracyclines & Related Hives and Rash    doxycycline    Medications: I have reviewed the patient's current medications. Prior to Admission: (Not in a hospital admission)  Scheduled:  acetaminophen   1,000 mg Oral TID   atorvastatin   40 mg Oral Daily   doxycycline   100 mg Oral BID   DULoxetine   60 mg Oral Daily   ezetimibe   10 mg Oral QPM   gabapentin   300 mg Oral QHS    insulin  aspart  0-5 Units Subcutaneous QHS   insulin  aspart  0-6 Units Subcutaneous TID WC   levofloxacin   750 mg Oral Q48H   levothyroxine   88 mcg Oral QAC breakfast   midodrine   5 mg Oral BID WC   potassium chloride   10 mEq Oral Once   sodium chloride  flush  3 mL Intravenous Q12H   tamsulosin   0.4 mg Oral QPC breakfast   Continuous:  lactated ringers  125 mL/hr at 11/26/23 1304   PRN:HYDROmorphone  (DILAUDID ) injection, oxyCODONE , prochlorperazine, senna  Results for orders placed or performed during the hospital encounter of 11/25/23 (from the past 48 hours)  Comprehensive metabolic panel with GFR     Status: Abnormal   Collection Time: 11/25/23  8:57 PM  Result Value Ref Range   Sodium 137 135 - 145 mmol/L   Potassium 3.3 (L) 3.5 - 5.1 mmol/L   Chloride 105 98 - 111 mmol/L   CO2 17 (L) 22 - 32 mmol/L   Glucose, Bld 121 (H) 70 - 99 mg/dL    Comment: Glucose reference range applies only to samples taken after fasting for at least 8 hours.   BUN 40 (H) 8 - 23 mg/dL   Creatinine, Ser 7.43 (H) 0.61 - 1.24 mg/dL   Calcium  7.5 (L) 8.9 - 10.3 mg/dL   Total Protein 4.3 (L) 6.5 - 8.1 g/dL   Albumin  1.7 (L) 3.5 - 5.0 g/dL   AST 16 15 - 41 U/L   ALT 8 0 - 44 U/L   Alkaline Phosphatase 83 38 - 126 U/L   Total Bilirubin 1.3 (H) 0.0 - 1.2 mg/dL   GFR, Estimated 26 (L) >60 mL/min    Comment: (NOTE) Calculated using the CKD-EPI Creatinine Equation (2021)    Anion gap 15 5 - 15    Comment: Performed at [facility], [address] [place], [place] [address]  CBC     Status: Abnormal   Collection Time: 11/25/23  8:57 PM  Result Value Ref Range   WBC 8.0 4.0 - 10.5 K/uL   RBC 2.11 (L) 4.22 - 5.81 MIL/uL   Hemoglobin 6.3 (LL) 13.0 - 17.0 g/dL    Comment: This critical result has been called to [facility], RN @  2150 11/25/2023 SORTOG by Charolet Simmer on 11/25/2023 21:53:41, and has been read back. REPEATED TO VERIFY CORRECTED ON 10/07 AT 2227: PREVIOUSLY REPORTED AS 6.3 This  critical result has been called to [facility], RN @ 2150 11/25/2023 SORTOG by Charolet Simmer on 11/25/2023 21:53:41, and has been read back.    HCT 18.7 (L) 39.0 - 52.0 %   MCV 88.6 80.0 - 100.0 fL   MCH 29.9 26.0 - 34.0 pg   MCHC 33.7 30.0 - 36.0 g/dL   RDW 84.3 (H) 88.4 - 84.4 %   Platelets 46 (L) 150 - 400 K/uL    Comment: SPECIMEN CHECKED FOR CLOTS REPEATED TO VERIFY Immature Platelet Fraction may be clinically indicated, consider ordering this additional test [id number]    nRBC 0.0 0.0 - 0.2 %    Comment: Performed at [facility] Lab, [address], [place], [place] [address]  Iron and TIBC     Status: None   Collection Time: 11/25/23  8:57 PM  Result Value Ref Range   Iron 65 45 - 182 ug/dL   TIBC NOT CALCULATED 749 - 450 ug/dL   Saturation Ratios NOT CALCULATED 17.9 - 39.5 %   UIBC NOT CALCULATED ug/dL    Comment: Performed at [facility] Lab, [address], [place], [place] [address]  Ferritin     Status: Abnormal   Collection Time: 11/25/23  8:57 PM  Result Value Ref Range   Ferritin 446 (H) 24 - 336 ng/mL    Comment: Performed at [facility] Lab, [address], [place], [place] [address]  Folate     Status: Abnormal   Collection Time: 11/25/23  8:58 PM  Result Value Ref Range   Folate 4.4 (L) >5.9 ng/mL    Comment: Performed at [facility] Lab, [address] [place], [place] [address]  Reticulocytes     Status: Abnormal   Collection Time: 11/25/23  8:58 PM  Result Value Ref Range   Retic Ct Pct 0.7 0.4 - 3.1 %   RBC. 2.20 (L) 4.22 - 5.81 MIL/uL   Retic Count, Absolute 16.1 (L) 19.0 - 186.0 K/uL   Immature Retic Fract 4.2 2.3 - 15.9 %    Comment: Performed at [facility] Lab, [address], [place], [place] [address]  Resp panel by RT-PCR (RSV, Flu A&B, Covid) Anterior Nasal Swab     Status: None   Collection Time: 11/25/23  9:13 PM   Specimen: Anterior Nasal Swab  Result Value Ref Range   SARS Coronavirus 2 by RT PCR NEGATIVE NEGATIVE    Influenza A by PCR NEGATIVE NEGATIVE   Influenza B by PCR NEGATIVE NEGATIVE    Comment: (NOTE) The Xpert Xpress SARS-CoV-2/FLU/RSV plus assay is intended as an aid in the diagnosis of influenza from Nasopharyngeal swab specimens and should not be used as a sole basis for treatment. Nasal washings and aspirates are unacceptable for Xpert Xpress SARS-CoV-2/FLU/RSV testing.  Fact Sheet for Patients: BloggerCourse.com  Fact Sheet for Healthcare Providers: SeriousBroker.it  This test is not yet approved or cleared by the [place]  FDA and has been authorized for detection and/or diagnosis of SARS-CoV-2 by FDA under an Emergency Use Authorization (EUA). This EUA will remain in effect (meaning this test can be used) for the duration of the COVID-19 declaration under Section 564(b)(1) of the Act, 21 U.S.C. section 360bbb-3(b)(1), unless the authorization is terminated or revoked.     Resp Syncytial Virus by PCR NEGATIVE NEGATIVE  Comment: (NOTE) Fact Sheet for Patients: BloggerCourse.com  Fact Sheet for Healthcare Providers: SeriousBroker.it  This test is not yet approved or cleared by the [place]  FDA and has been authorized for detection and/or diagnosis of SARS-CoV-2 by FDA under an Emergency Use Authorization (EUA). This EUA will remain in effect (meaning this test can be used) for the duration of the COVID-19 declaration under Section 564(b)(1) of the Act, 21 U.S.C. section 360bbb-3(b)(1), unless the authorization is terminated or revoked.  Performed at [facility] Lab, [address], [place], [place] [address]   Blood Culture (routine x 2)     Status: None (Preliminary result)   Collection Time: 11/25/23  9:13 PM   Specimen: BLOOD  Result Value Ref Range   Specimen Description BLOOD SITE NOT SPECIFIED    Special Requests      BOTTLES DRAWN AEROBIC AND  ANAEROBIC Blood Culture results may not be optimal due to an inadequate volume of blood received in culture bottles   Culture      NO GROWTH < 12 HOURS Performed at [facility], [address] [place], [place] [address]    Report Status PENDING   Vitamin B12     Status: None   Collection Time: 11/25/23  9:19 PM  Result Value Ref Range   Vitamin B-12 487 180 - 914 pg/mL    Comment: (NOTE) This assay is not validated for testing neonatal or myeloproliferative syndrome specimens for Vitamin B12 levels. Performed at [facility] Lab, [address], [place], [place] [address]   Magnesium      Status: Abnormal   Collection Time: 11/25/23  9:19 PM  Result Value Ref Range   Magnesium  1.5 (L) 1.7 - 2.4 mg/dL    Comment: Performed at [facility] Lab, [address] [place], [place] [address]  Type and screen     Status: None (Preliminary result)   Collection Time: 11/25/23  9:25 PM  Result Value Ref Range   ABO/RH(D) A POS    Antibody Screen NEG    Sample Expiration 12-14-2023,2359    Unit Number [id number]    Blood Component Type RED CELLS,LR    Unit division 00    Status of Unit ISSUED    Transfusion Status OK TO TRANSFUSE    Crossmatch Result Compatible    Unit Number [id number]    Blood Component Type RED CELLS,LR    Unit division 00    Status of Unit ISSUED    Transfusion Status OK TO TRANSFUSE    Crossmatch Result      Compatible Performed at [facility] Lab, [address] [place], [place] [address]   Blood Culture (routine x 2)     Status: None (Preliminary result)   Collection Time: 11/25/23  9:25 PM   Specimen: BLOOD  Result Value Ref Range   Specimen Description BLOOD SITE NOT SPECIFIED    Special Requests      BOTTLES DRAWN AEROBIC AND ANAEROBIC Blood Culture results may not be optimal due to an inadequate volume of blood received in culture bottles   Culture      NO GROWTH < 12 HOURS Performed at [facility] Lab, [address]  [place], [place] [address]    Report Status PENDING   I-Stat Lactic Acid, ED     Status: None   Collection Time: 11/25/23  9:32 PM  Result Value Ref Range   Lactic Acid, Venous 1.9 0.5 - 1.9 mmol/L  I-stat chem 8, ED     Status: Abnormal   Collection Time: 11/25/23  9:32 PM  Result Value Ref Range   Sodium 139 135 - 145 mmol/L   Potassium 3.3 (L) 3.5 - 5.1 mmol/L   Chloride 106 98 - 111 mmol/L   BUN 33 (H) 8 - 23 mg/dL   Creatinine, Ser 7.39 (H) 0.61 - 1.24 mg/dL   Glucose, Bld 889 (H) 70 - 99 mg/dL    Comment: Glucose reference range applies only to samples taken after fasting for at least 8 hours.   Calcium , Ion 1.07 (L) 1.15 - 1.40 mmol/L   TCO2 17 (L) 22 - 32 mmol/L   Hemoglobin 6.1 (LL) 13.0 - 17.0 g/dL   HCT 81.9 (L) 60.9 - 47.9 %   Comment NOTIFIED PHYSICIAN   POC occult blood, ED Provider will collect     Status: Abnormal   Collection Time: 11/25/23  9:34 PM  Result Value Ref Range   Fecal Occult Bld POSITIVE (A) NEGATIVE  Urinalysis, w/ Reflex to Culture (Infection Suspected) -Urine, Catheterized     Status: Abnormal   Collection Time: 11/25/23 10:29 PM  Result Value Ref Range   Specimen Source URINE, CATHETERIZED    Color, Urine YELLOW YELLOW   APPearance CLOUDY (A) CLEAR   Specific Gravity, Urine 1.013 1.005 - 1.030   pH 5.0 5.0 - 8.0   Glucose, UA NEGATIVE NEGATIVE mg/dL   Hgb urine dipstick NEGATIVE NEGATIVE   Bilirubin Urine NEGATIVE NEGATIVE   Ketones, ur NEGATIVE NEGATIVE mg/dL   Protein, ur 899 (A) NEGATIVE mg/dL   Nitrite NEGATIVE NEGATIVE   Leukocytes,Ua SMALL (A) NEGATIVE   RBC / HPF 0-5 0 - 5 RBC/hpf   WBC, UA 11-20 0 - 5 WBC/hpf    Comment:        Reflex urine culture not performed if WBC <=10, OR if Squamous epithelial cells >5. If Squamous epithelial cells >5 suggest recollection.    Bacteria, UA RARE (A) NONE SEEN   Squamous Epithelial / HPF 0-5 0 - 5 /HPF   Mucus PRESENT    Budding Yeast PRESENT    Hyaline Casts, UA PRESENT     Comment:  Performed at [facility] Lab, [address] [place], [place] [address]  Creatinine, urine, random     Status: None   Collection Time: 11/25/23 10:29 PM  Result Value Ref Range   Creatinine, Urine 122 mg/dL    Comment: Performed at [facility], [address], [place], [place] [address]  Sodium, urine, random     Status: None   Collection Time: 11/25/23 10:29 PM  Result Value Ref Range   Sodium, Ur <30 mmol/L    Comment: Performed at [facility] Lab, [address] [place], [place] [address]  Prepare RBC (crossmatch)     Status: None   Collection Time: 11/25/23 10:30 PM  Result Value Ref Range   Order Confirmation      ORDER PROCESSED BY BLOOD BANK Performed at [facility] Lab, [address], [place], [place] [address]   Technologist smear review     Status: None   Collection Time: 11/26/23  5:47 AM  Result Value Ref Range   WBC MORPHOLOGY MORPHOLOGY UNREMARKABLE    RBC MORPHOLOGY MORPHOLOGY UNREMARKABLE    Plt Morphology MORPHOLOGY UNREMARKABLE    Clinical Information      acute on chronic anemia, new thrombocytopenia, low absolute reticulocyte count    Comment: Performed at [facility], [address] [place], [place] [address]  Phosphorus     Status: None   Collection Time: 11/26/23  5:47 AM  Result Value Ref Range   Phosphorus 3.2 2.5 - 4.6 mg/dL    Comment: Performed at [facility] Lab, [address] [place], [place] [address]  Basic metabolic panel     Status: Abnormal   Collection Time: 11/26/23  5:48 AM  Result Value Ref Range   Sodium 135 135 - 145 mmol/L   Potassium 3.1 (L) 3.5 - 5.1 mmol/L   Chloride 104 98 - 111 mmol/L   CO2 14 (L) 22 - 32 mmol/L   Glucose, Bld 146 (H) 70 - 99 mg/dL    Comment: Glucose reference range applies only to samples taken after fasting for at least 8 hours.   BUN 43 (H) 8 - 23 mg/dL   Creatinine, Ser 7.32 (H) 0.61 - 1.24 mg/dL   Calcium  7.3 (L) 8.9 - 10.3 mg/dL   GFR, Estimated 25 (L) >60 mL/min     Comment: (NOTE) Calculated using the CKD-EPI Creatinine Equation (2021)    Anion gap 17 (H) 5 - 15    Comment: Performed at [facility] Lab, [address] [place], [place] [address]  Magnesium      Status: Abnormal   Collection Time: 11/26/23  5:48 AM  Result Value Ref Range   Magnesium  1.4 (L) 1.7 - 2.4 mg/dL    Comment: Performed at [facility], [address] [place], [place] [address]  CBC     Status: Abnormal   Collection Time: 11/26/23  5:48 AM  Result Value Ref Range   WBC 7.6 4.0 - 10.5 K/uL   RBC 2.98 (L) 4.22 - 5.81 MIL/uL   Hemoglobin 8.8 (L) 13.0 - 17.0 g/dL    Comment: REPEATED TO VERIFY POST TRANSFUSION SPECIMEN    HCT 26.5 (L) 39.0 - 52.0 %   MCV 88.9 80.0 - 100.0 fL   MCH 29.5 26.0 - 34.0 pg   MCHC 33.2 30.0 - 36.0 g/dL   RDW 85.3 88.4 - 84.4 %   Platelets 38 (L) 150 - 400 K/uL    Comment: CONSISTENT WITH PREVIOUS RESULT REPEATED TO VERIFY Immature Platelet Fraction may be clinically indicated, consider ordering this additional test [id number]    nRBC 0.0 0.0 - 0.2 %    Comment: Performed at [facility], [address] [place], [place] [address]  Vitamin B12     Status: None   Collection Time: 11/26/23  5:48 AM  Result Value Ref Range   Vitamin B-12 453 180 - 914 pg/mL    Comment: (NOTE) This assay is not validated for testing neonatal or myeloproliferative syndrome specimens for Vitamin B12 levels. Performed at [facility], [address], [place], [place] [address]   Folate     Status: Abnormal   Collection Time: 11/26/23  5:48 AM  Result Value Ref Range   Folate 4.1 (L) >5.9 ng/mL    Comment: Performed at [facility], [address] [place], [place] [address]  Iron and TIBC     Status: None   Collection Time: 11/26/23  5:48 AM  Result Value Ref Range   Iron 69 45 - 182 ug/dL   TIBC NOT CALCULATED 749 - 450 ug/dL   Saturation Ratios NOT CALCULATED 17.9 - 39.5 %   UIBC NOT CALCULATED ug/dL    Comment: Performed  at [facility]  Lab, [address] [place], [place] [address]  Ferritin     Status: Abnormal   Collection Time: 11/26/23  5:48 AM  Result Value Ref Range   Ferritin 483 (H) 24 - 336 ng/mL    Comment: Performed at [facility], [address], [place], [place] [address]  Reticulocytes     Status: Abnormal   Collection Time: 11/26/23  5:48 AM  Result Value Ref Range   Retic Ct Pct 0.8 0.4 - 3.1 %   RBC. 2.99 (L) 4.22 - 5.81 MIL/uL   Retic Count, Absolute 23.9 19.0 - 186.0 K/uL   Immature Retic Fract 5.8 2.3 - 15.9 %    Comment: Performed at [facility], [address], [place], [place] [address]  Differential     Status: Abnormal   Collection Time: 11/26/23  5:48 AM  Result Value Ref Range   Neutrophils Relative % 88 %   Neutro Abs 6.6 1.7 - 7.7 K/uL   Lymphocytes Relative 7 %   Lymphs Abs 0.6 (L) 0.7 - 4.0 K/uL   Monocytes Relative 5 %   Monocytes Absolute 0.4 0.1 - 1.0 K/uL   Eosinophils Relative 0 %   Eosinophils Absolute 0.0 0.0 - 0.5 K/uL   Basophils Relative 0 %   Basophils Absolute 0.0 0.0 - 0.1 K/uL   WBC Morphology MORPHOLOGY UNREMARKABLE    RBC Morphology MORPHOLOGY UNREMARKABLE    Smear Review Normal platelet morphology    Immature Granulocytes 0 %   Abs Immature Granulocytes 0.03 0.00 - 0.07 K/uL    Comment: Performed at [facility], [address] [place], [place] [address]  CBG monitoring, ED     Status: Abnormal   Collection Time: 11/26/23  7:37 AM  Result Value Ref Range   Glucose-Capillary 123 (H) 70 - 99 mg/dL    Comment: Glucose reference range applies only to samples taken after fasting for at least 8 hours.  CBG monitoring, ED     Status: Abnormal   Collection Time: 11/26/23 11:39 AM  Result Value Ref Range   Glucose-Capillary 104 (H) 70 - 99 mg/dL    Comment: Glucose reference range applies only to samples taken after fasting for at least 8 hours.   *Note: Due to a large number of results and/or encounters for the requested time  period, some results have not been displayed. A complete set of results can be found in Results Review.   CT Head Wo Contrast Result Date: 11/25/2023 EXAM: CT HEAD WITHOUT CONTRAST 11/25/2023 10:05:33 PM TECHNIQUE: CT of the head was performed without the administration of intravenous contrast. Automated exposure control, iterative reconstruction, and/or weight based adjustment of the mA/kV was utilized to reduce the radiation dose to as low as reasonably achievable. COMPARISON: None available. CLINICAL HISTORY: Mental status change, unknown cause. FINDINGS: BRAIN AND VENTRICLES: Moderate global parenchymal volume loss is slightly advanced in relation to the patient's stated age. Mild periventricular white matter changes are present, likely reflecting small vessel ischemia. Ventricular size is normal and commensurate with a degree of parenchymal volume loss. No acute hemorrhage. No evidence of acute infarct. No hydrocephalus. No extra-axial collection. No mass effect or midline shift. ORBITS: No acute abnormality. SINUSES: No acute abnormality. SOFT TISSUES AND SKULL: No acute soft tissue abnormality. No skull fracture. IMPRESSION: 1. No acute intracranial abnormality. 2. Moderate global parenchymal volume loss, slightly advanced for age. Electronically signed by: Dorethia Molt MD 11/25/2023 10:10 PM EDT RP Workstation: [id number]  DG Chest Port 1 View Result Date: 11/25/2023 EXAM: 1 VIEW XRAY OF THE CHEST 11/25/2023 09:28:00 PM COMPARISON: None available. CLINICAL HISTORY: weakness. Pt bib [facility] coming from home with CC of generalized weakness. Last week, patient has had increase in AMS. Pt A\T\Ox3 but A\T\Ox4 at baseline. Blood work was done last week with hemoglobin of 5 but patient did not get treatment for this. Pt has swelling to ; left arm. GCS 14. weakness. Pt bib [facility] coming from home with CC of generalized weakness. Last week, patient has had increase in AMS. Pt A\T\Ox3 but A\T\Ox4 at baseline.  Blood work was done last week with hemoglobin of 5 but patient did not get treatment for this. Pt has swelling to ; left arm. GCS 14. FINDINGS: LUNGS AND PLEURA: Pulmonary hyperinflation. No pulmonary opacity. No pleural effusion. No pneumothorax. HEART AND MEDIASTINUM: Cardiac size within normal limits. BONES AND SOFT TISSUES: No acute bone abnormality. Bony vascularity is normal. IMPRESSION: 1. No acute process. 2. Pulmonary hyperinflation. Electronically signed by: Dorethia Molt MD 11/25/2023 09:32 PM EDT RP Workstation: [id number]   Review of Systems  Constitutional:  Positive for activity change, appetite change, chills, diaphoresis and fatigue. Negative for fever and unexpected weight change.  Eyes:  Negative for photophobia, pain, discharge, redness, itching and visual disturbance.  Respiratory:  Positive for shortness of breath. Negative for apnea, cough, choking, chest tightness and stridor.   Cardiovascular:  Positive for leg swelling. Negative for chest pain and palpitations.  Gastrointestinal:  Positive for constipation. Negative for abdominal distention, abdominal pain, anal bleeding, blood in stool, diarrhea, nausea and rectal pain.  Endocrine: Negative.   Genitourinary: Negative.   Musculoskeletal:  Positive for arthralgias, back pain, gait problem, joint swelling and myalgias.  Neurological:  Positive for weakness and light-headedness. Negative for dizziness, tremors, seizures, syncope, facial asymmetry, speech difficulty, numbness and headaches.  Hematological:  Bruises/bleeds easily.  Psychiatric/Behavioral:  Positive for agitation and confusion.    Blood pressure (!) 96/57, pulse 69, temperature 98.4 F (36.9 C), temperature source Axillary, resp. rate 19, SpO2 100%. Physical Exam Vitals reviewed.  Constitutional:      General: He is sleeping. He is in acute distress.  HENT:     Head: Normocephalic and atraumatic.  Eyes:     General: Lids are normal.     Extraocular  Movements: Extraocular movements intact.  Cardiovascular:     Rate and Rhythm: Normal rate and regular rhythm.  Pulmonary:     Effort: Pulmonary effort is normal.  Abdominal:     General: Abdomen is protuberant. Bowel sounds are normal.     Palpations: Abdomen is soft.     Tenderness: There is no abdominal tenderness.  Musculoskeletal:     Cervical back: Neck supple.  Skin:    General: Skin is warm.     Coloration: Skin is pale.     Findings: Abrasion present.     Comments: Multiple bruises on both upper and lower extremities  Neurological:     Mental Status: He is lethargic, disoriented and confused.   Assessment/Plan: 1) Acute on chronic iron deficiency anemia with a hemoglobin of 6.3 g/dL today guaiac positive stools. Considering the patient's comorbidities I do not think he is stable enough to have any invasive procedures at this time. I have discussed this with his spouse Zachary who spoke to over the phone.  He claims that the patient would like to be a full code and would like to be intubated if need arises. 2) Chronic  folic acid deficiency. 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 10) Benign prostatic hypertrophy. 11) Colonic diverticulosis/history of tubular adenomas. 12) Obesity 13) FULL CODE Atleigh Gruen 11/26/2023, 2:30 PM

## 2023-11-27 ENCOUNTER — Inpatient Hospital Stay (HOSPITAL_COMMUNITY)

## 2023-11-27 ENCOUNTER — Encounter (HOSPITAL_COMMUNITY): Payer: Self-pay | Admitting: Family Medicine

## 2023-11-27 ENCOUNTER — Other Ambulatory Visit: Payer: Self-pay

## 2023-11-27 ENCOUNTER — Ambulatory Visit (HOSPITAL_COMMUNITY): Admitting: Licensed Clinical Social Worker

## 2023-11-27 DIAGNOSIS — R1310 Dysphagia, unspecified: Secondary | ICD-10-CM

## 2023-11-27 DIAGNOSIS — J9601 Acute respiratory failure with hypoxia: Secondary | ICD-10-CM

## 2023-11-27 DIAGNOSIS — M79602 Pain in left arm: Secondary | ICD-10-CM

## 2023-11-27 DIAGNOSIS — N179 Acute kidney failure, unspecified: Secondary | ICD-10-CM

## 2023-11-27 DIAGNOSIS — R627 Adult failure to thrive: Secondary | ICD-10-CM

## 2023-11-27 DIAGNOSIS — R739 Hyperglycemia, unspecified: Secondary | ICD-10-CM

## 2023-11-27 LAB — BASIC METABOLIC PANEL WITH GFR
Anion gap: 17 — ABNORMAL HIGH (ref 5–15)
Anion gap: 17 — ABNORMAL HIGH (ref 5–15)
BUN: 48 mg/dL — ABNORMAL HIGH (ref 8–23)
BUN: 49 mg/dL — ABNORMAL HIGH (ref 8–23)
CO2: 14 mmol/L — ABNORMAL LOW (ref 22–32)
CO2: 15 mmol/L — ABNORMAL LOW (ref 22–32)
Calcium: 7.6 mg/dL — ABNORMAL LOW (ref 8.9–10.3)
Calcium: 7.6 mg/dL — ABNORMAL LOW (ref 8.9–10.3)
Chloride: 103 mmol/L (ref 98–111)
Chloride: 103 mmol/L (ref 98–111)
Creatinine, Ser: 3.33 mg/dL — ABNORMAL HIGH (ref 0.61–1.24)
Creatinine, Ser: 3.34 mg/dL — ABNORMAL HIGH (ref 0.61–1.24)
GFR, Estimated: 19 mL/min — ABNORMAL LOW (ref >60)
GFR, Estimated: 19 mL/min — ABNORMAL LOW (ref >60)
Glucose, Bld: 223 mg/dL — ABNORMAL HIGH (ref 70–99)
Glucose, Bld: 224 mg/dL — ABNORMAL HIGH (ref 70–99)
Potassium: 3.4 mmol/L — ABNORMAL LOW (ref 3.5–5.1)
Potassium: 3.5 mmol/L (ref 3.5–5.1)
Sodium: 134 mmol/L — ABNORMAL LOW (ref 135–145)
Sodium: 135 mmol/L (ref 135–145)

## 2023-11-27 LAB — TYPE AND SCREEN
ABO/RH(D): A POS
Antibody Screen: NEGATIVE
Unit division: 0
Unit division: 0

## 2023-11-27 LAB — URINE CULTURE: Culture: >=100000 — AB

## 2023-11-27 LAB — MRSA NEXT GEN BY PCR, NASAL: MRSA by PCR Next Gen: NOT DETECTED

## 2023-11-27 LAB — TSH: TSH: 12.033 u[IU]/mL — ABNORMAL HIGH (ref 0.350–4.500)

## 2023-11-27 LAB — GLUCOSE, CAPILLARY
Glucose-Capillary: 122 mg/dL — ABNORMAL HIGH (ref 70–99)
Glucose-Capillary: 125 mg/dL — ABNORMAL HIGH (ref 70–99)
Glucose-Capillary: 126 mg/dL — ABNORMAL HIGH (ref 70–99)
Glucose-Capillary: 148 mg/dL — ABNORMAL HIGH (ref 70–99)
Glucose-Capillary: 76 mg/dL (ref 70–99)

## 2023-11-27 LAB — SEDIMENTATION RATE: Sed Rate: 16 mm/h (ref 0–16)

## 2023-11-27 LAB — DIC (DISSEMINATED INTRAVASCULAR COAGULATION)PANEL
D-Dimer, Quant: 3.23 ug{FEU}/mL — ABNORMAL HIGH (ref 0.00–0.50)
Fibrinogen: 392 mg/dL (ref 210–475)
INR: 1.9 — ABNORMAL HIGH (ref 0.8–1.2)
Platelets: 60 K/uL — ABNORMAL LOW (ref 150–400)
Prothrombin Time: 22.3 s — ABNORMAL HIGH (ref 11.4–15.2)
Smear Review: NONE SEEN
aPTT: 58 s — ABNORMAL HIGH (ref 24–36)

## 2023-11-27 LAB — CBC
HCT: 27.6 % — ABNORMAL LOW (ref 39.0–52.0)
Hemoglobin: 9.4 g/dL — ABNORMAL LOW (ref 13.0–17.0)
MCH: 29.5 pg (ref 26.0–34.0)
MCHC: 34.1 g/dL (ref 30.0–36.0)
MCV: 86.5 fL (ref 80.0–100.0)
Platelets: 62 K/uL — ABNORMAL LOW (ref 150–400)
RBC: 3.19 MIL/uL — ABNORMAL LOW (ref 4.22–5.81)
RDW: 15.4 % (ref 11.5–15.5)
WBC: 16.9 K/uL — ABNORMAL HIGH (ref 4.0–10.5)
nRBC: 1.1 % — ABNORMAL HIGH (ref 0.0–0.2)

## 2023-11-27 LAB — HEPATIC FUNCTION PANEL
ALT: 8 U/L (ref 0–44)
AST: 25 U/L (ref 15–41)
Albumin: 2 g/dL — ABNORMAL LOW (ref 3.5–5.0)
Alkaline Phosphatase: 67 U/L (ref 38–126)
Bilirubin, Direct: 0.4 mg/dL — ABNORMAL HIGH (ref 0.0–0.2)
Indirect Bilirubin: 0.8 mg/dL (ref 0.3–0.9)
Total Bilirubin: 1.2 mg/dL (ref 0.0–1.2)
Total Protein: 4.2 g/dL — ABNORMAL LOW (ref 6.5–8.1)

## 2023-11-27 LAB — BPAM RBC
Blood Product Expiration Date: 202511052359
Blood Product Expiration Date: 202511052359
ISSUE DATE / TIME: 202510080017
ISSUE DATE / TIME: 202510080314
Unit Type and Rh: 6200
Unit Type and Rh: 6200

## 2023-11-27 LAB — BRAIN NATRIURETIC PEPTIDE: B Natriuretic Peptide: 94.6 pg/mL (ref 0.0–100.0)

## 2023-11-27 LAB — CORTISOL: Cortisol, Plasma: 45.1 ug/dL

## 2023-11-27 LAB — AMMONIA: Ammonia: <13 umol/L (ref 9–35)

## 2023-11-27 LAB — MAGNESIUM: Magnesium: 1.8 mg/dL (ref 1.7–2.4)

## 2023-11-27 LAB — C-REACTIVE PROTEIN: CRP: 25.9 mg/dL — ABNORMAL HIGH (ref <1.0)

## 2023-11-27 LAB — LACTIC ACID, PLASMA: Lactic Acid, Venous: 5.2 mmol/L (ref 0.5–1.9)

## 2023-11-27 MED ORDER — PHENTOLAMINE MESYLATE 5 MG IJ SOLR: 5.0000 mg | Freq: Once | INTRAMUSCULAR | Status: DC

## 2023-11-27 MED ORDER — INSULIN ASPART 100 UNIT/ML IJ SOLN
0.0000 [IU] | INTRAMUSCULAR | Status: DC
  Administered 2023-11-28: 1 [IU] via SUBCUTANEOUS
  Administered 2023-11-28: 4 [IU] via SUBCUTANEOUS

## 2023-11-27 MED ORDER — SODIUM CHLORIDE 0.9% FLUSH
10.0000 mL | Freq: Two times a day (BID) | INTRAVENOUS | Status: DC
  Administered 2023-11-27: 10 mL

## 2023-11-27 MED ORDER — NITROGLYCERIN 2 % TD OINT
1.0000 [in_us] | TOPICAL_OINTMENT | Freq: Three times a day (TID) | TRANSDERMAL | Status: DC
  Administered 2023-11-27 – 2023-11-28 (×3): 1 [in_us] via TOPICAL
  Filled 2023-11-27: qty 30

## 2023-11-27 MED ORDER — FENTANYL CITRATE (PF) 50 MCG/ML IJ SOSY
25.0000 ug | PREFILLED_SYRINGE | INTRAMUSCULAR | Status: DC | PRN
  Administered 2023-11-28: 25 ug via INTRAVENOUS
  Filled 2023-11-27: qty 1

## 2023-11-27 MED ORDER — HYDROMORPHONE HCL 1 MG/ML IJ SOLN: 0.2500 mg | INTRAMUSCULAR | Status: DC | PRN

## 2023-11-27 MED ORDER — MEDIHONEY WOUND/BURN DRESSING EX PSTE
1.0000 | PASTE | Freq: Every day | CUTANEOUS | Status: DC
  Administered 2023-11-27: 1 via TOPICAL
  Filled 2023-11-27: qty 44

## 2023-11-27 MED ORDER — GABAPENTIN 100 MG PO CAPS
100.0000 mg | ORAL_CAPSULE | Freq: Every day | ORAL | Status: DC
Start: 2023-11-27 — End: 2023-11-28

## 2023-11-27 MED ORDER — HYDROMORPHONE HCL 1 MG/ML IJ SOLN: 0.2000 mg | INTRAMUSCULAR | Status: DC | PRN

## 2023-11-27 MED ORDER — ACETAMINOPHEN 10 MG/ML IV SOLN
1000.0000 mg | Freq: Four times a day (QID) | INTRAVENOUS | Status: AC
  Administered 2023-11-27 – 2023-11-28 (×4): 1000 mg via INTRAVENOUS
  Filled 2023-11-27 (×4): qty 100

## 2023-11-27 MED ORDER — ALBUMIN HUMAN 5 % IV SOLN
25.0000 g | Freq: Once | INTRAVENOUS | Status: AC
  Administered 2023-11-27: 25 g via INTRAVENOUS
  Filled 2023-11-27: qty 500

## 2023-11-27 MED ORDER — LACTATED RINGERS IV BOLUS
1000.0000 mL | Freq: Once | INTRAVENOUS | Status: AC
  Administered 2023-11-27: 1000 mL via INTRAVENOUS

## 2023-11-27 MED ORDER — SODIUM CHLORIDE 0.9% FLUSH: 10.0000 mL | INTRAVENOUS | Status: DC | PRN

## 2023-11-27 MED ORDER — NOREPINEPHRINE 4 MG/250ML-% IV SOLN
0.0000 ug/min | INTRAVENOUS | Status: DC
  Administered 2023-11-27: 2 ug/min via INTRAVENOUS
  Administered 2023-11-28: 7 ug/min via INTRAVENOUS
  Filled 2023-11-27: qty 250

## 2023-11-27 MED ORDER — NITROGLYCERIN 2 % TD OINT: 1.0000 [in_us] | TOPICAL_OINTMENT | Freq: Three times a day (TID) | TRANSDERMAL | Status: DC

## 2023-11-27 MED ORDER — PANTOPRAZOLE SODIUM 40 MG IV SOLR
40.0000 mg | Freq: Two times a day (BID) | INTRAVENOUS | Status: DC
  Administered 2023-11-27 (×2): 40 mg via INTRAVENOUS
  Filled 2023-11-27 (×2): qty 10

## 2023-11-27 MED ORDER — OXIDIZED CELLULOSE EX PADS
3.0000 | MEDICATED_PAD | Freq: Once | CUTANEOUS | Status: AC
  Administered 2023-11-28: 3 via TOPICAL
  Filled 2023-11-27: qty 3

## 2023-11-27 MED ORDER — PHENTOLAMINE MESYLATE 5 MG IJ SOLR
5.0000 mg | Freq: Once | INTRAMUSCULAR | Status: AC
  Administered 2023-11-27: 5 mg via SUBCUTANEOUS
  Filled 2023-11-27: qty 5

## 2023-11-27 NOTE — Progress Notes (Signed)
 NAME:  RANDIE TALLARICO, MRN:  [id number], DOB:  [birth year], LOS: 2 ADMISSION DATE:  11/25/2023, CONSULTATION DATE:  11/26/2023 REFERRING MD:  Dr. Keturah, CHIEF COMPLAINT:  hypotension   History of Present Illness:  Mr. 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 Of note he was also recently admitted 09/16/2023-09/22/2023 for osteomyelitis of the right ankle with associated hardware infection. He decided against curative BKA and underwent I&D. He was discharged on IV daptomycin  and ertapenem . He follows with ID outpatient and completed IV therapy 10/14/2023 and was transitioned to oral antibiotics with chronic suppressive doxycycline  and levaquin .    In the ED he was found to have Hgb 6.3 and platelet count 46. He received 2 PRBC with appropriate rise in hemoglobin. He was admitted to [facility] for further management. Prior to admission he had refused to come to the hospital despite Hgb of 5 on outpatient blood work.   Throughout the course of the day 10/8 he developed refractory hypotension despite IVF resuscitation and increased midodrine . He was restarted on IV vancomycin  and cefepime  and ultimately required vasopressor support. PCCM was consulted and he was transferred to the ICU for further management.   Pertinent  Medical History  Per above  Significant Hospital Events: Including procedures, antibiotic start and stop dates in addition to other pertinent events   10/8: Admitted to medicine service for anemia 10/9: Developed worsening hypotension and altered mental status, resumed on broad spectrum IV antibiotics, required vasopressor support, PCCM consulted  and transferred to ICU for further management  10/10 ortho consult   Interim History / Subjective:  NE up to 10. LUE IV infiltrated  Difficult lab draw  Objective    Blood pressure (!) 89/65, pulse 75, temperature 98.1 F (36.7 C), resp. rate (!) 24, height 6' 1 (1.854 m), weight 85.9 kg, SpO2 100%.        Intake/Output Summary (Last 24 hours) at 11/27/2023 1125 Last data filed at 11/27/2023 1100 Gross per 24 hour  Intake 6346.32 ml  Output 130 ml  Net 6216.32 ml   Filed Weights   11/26/23 2205 11/27/23 0500  Weight: 98.2 kg 85.9 kg    Examination: General: chronically and critically ill appearing elderly M  Neuro: awake, lethargic. Following commands. Oriented x 1-2 HENT: NCAT. Dry mm and tongue.  Lungs: even unlabored. Clear  Cardiovascular: rrr  Abdomen: soft, non-tender, non-distended Extremities: BUE BLE pitting edema. LUE wrapped.    Resolved problem list   Assessment and Plan   Acute metabolic encephalopathy  P -NPO -minimize CNS depressing meds  -delirium precautions -tx underlying contributory causes as able   Mixed hypovolemic and septic shock  RLE hardware associated osteo  -Cultures have previously grown E. cloacae, staph capitis and klebsiella pneumonia which were previously sensitive to vanc and cefepime  P -in chart review it looks like pt has declined BKA, and is failing abx. In review of recent ID note, hospice was recommended -overnight in ICU it sounds like husband wanted to pursue amputation. Pt is disoriented and cannot meaningfully contribute to this discussion. Husband is not answering calls 10/9  -I will reach out to ortho and discuss. He is now unstable and imagine would be high risk surgical  case. His overall trajectory seems to be progressively deteriorating, and while I can appreciate husband is wanting this to be considered, I am not sure if such intervention would meaningfully impact pt outcomes -cont broad abx -NE for MAP > 60 -IVF,  albumin  -midodrine  if we get NGT access  -may need central access. Would probably favor PICC, despite renal fxn   pAF on eliquis   dHF CAD  Chronic hypotension  P - Consider repeat Echo -holding home eliquis , antiplt w acute thrombocytopenia, anemia   AKI on CKD  NAGMA  HypoK HypoCa HypoMag  BPH P -follow renal indices uop -replace lytes -foley, hold flowmax   AoC anemia Acute thrombocytopenia  Acute on chronic anemia P -GI following, too sick for intervention  - follow CBC   Hypothyroidism - TSH pending  - Continue home Synthroid   T2DM - SSI   FTT in adult Physical deconditioning Inadequate PO intake / malnutrition  P -Palliative consult -failed swallow 10/9 and sounds like his eval raises concerns for very poor intake of late given dried findings in back of mouth  -will try NGT placement   Multiple skin lesions including sacral wound, POA - Will consult wound care  GOC -palliative consult is placed -when txf to ICU it sounds like husband wanted to pursue any/all aggressive interventions including ICU procedures, ortho surgery -in review of pt course especially the last several months, I am not sure that aggressive interventions would meaningfully impact pt outcomes and QOL, and would recommend consideration of DNR, hospice.  -I have not been successful in reaching husband 11/27/23   Labs   CBC: Recent Labs  Lab 11/25/23 2057 11/25/23 2132 11/26/23 0548 11/26/23 2015  WBC 8.0  --  7.6 7.5  NEUTROABS  --   --  6.6  --   HGB 6.3* 6.1* 8.8* 8.9*  HCT 18.7* 18.0* 26.5* 26.1*  MCV 88.6  --  88.9 86.4  PLT 46*  --  38* 44*    Basic Metabolic Panel: Recent Labs  Lab 11/25/23 2057 11/25/23 2119 11/25/23 2132 11/26/23 0547 11/26/23 0548 11/26/23 2015  NA 137  --  139  --  135 137  K 3.3*  --  3.3*  --  3.1* 3.0*  CL 105  --  106  --  104 105  CO2 17*  --   --   --  14* 19*  GLUCOSE 121*  --  110*  --  146* 146*  BUN 40*  --  33*  --  43* 45*   CREATININE 2.56*  --  2.60*  --  2.67* 2.80*  CALCIUM  7.5*  --   --   --  7.3* 7.5*  MG  --  1.5*  --   --  1.4* 1.8  PHOS  --   --   --  3.2  --  3.4   GFR: Estimated Creatinine Clearance: 27.7 mL/min (A) (by C-G formula based on SCr of 2.8 mg/dL (H)). Recent Labs  Lab 11/25/23 2057 11/25/23 2132 11/26/23 0548 11/26/23 2015  WBC 8.0  --  7.6 7.5  LATICACIDVEN  --  1.9  --  2.3*    Liver Function Tests: Recent Labs  Lab 11/25/23 2057  AST 16  ALT 8  ALKPHOS 83  BILITOT 1.3*  PROT 4.3*  ALBUMIN  1.7*   No results for input(s): LIPASE, AMYLASE in the last 168 hours. No results for input(s): AMMONIA in the last 168 hours.  ABG    Component Value Date/Time  PHART 7.44 05/15/2023 2309   PCO2ART 31 (L) 05/15/2023 2309   PO2ART 104 05/15/2023 2309   HCO3 20.6 11/26/2023 2015   TCO2 17 (L) 11/25/2023 2132   ACIDBASEDEF 5.4 (H) 11/26/2023 2015   O2SAT 48.5 11/26/2023 2015     Coagulation Profile: No results for input(s): INR, PROTIME in the last 168 hours.  Cardiac Enzymes: No results for input(s): CKTOTAL, CKMB, CKMBINDEX, TROPONINI in the last 168 hours.  HbA1C: Hgb A1c MFr Bld  Date/Time Value Ref Range Status  07/18/2023 02:27 AM 5.2 4.8 - 5.6 % Final    Comment:    (NOTE) Diagnosis of Diabetes The following HbA1c ranges recommended by the American Diabetes Association (ADA) may be used as an aid in the diagnosis of diabetes mellitus.  Hemoglobin             Suggested A1C NGSP%              Diagnosis  <5.7                   Non Diabetic  5.7-6.4                Pre-Diabetic  >6.4                   Diabetic  <7.0                   Glycemic control for                       adults with diabetes.    01/02/2023 04:45 PM 7.0 (H) 4.8 - 5.6 % Final    Comment:    (NOTE) Pre diabetes:          5.7%-6.4%  Diabetes:              >6.4%  Glycemic control for   <7.0% adults with diabetes     CBG: Recent Labs  Lab 11/26/23 0737  11/26/23 1139 11/26/23 1738 11/27/23 0656 11/27/23 1118  GLUCAP 123* 104* 120* 148* 126*    CRITICAL CARE Performed by: Ronnald FORBES Gave   Total critical care time:  40 minutes   Critical care time was exclusive of separately billable procedures and treating other patients. Critical care was necessary to treat or prevent imminent or life-threatening deterioration.  Critical care was time spent personally by me on the following activities: development of treatment plan with patient and/or surrogate as well as nursing, discussions with consultants, evaluation of patient's response to treatment, examination of patient, obtaining history from patient or surrogate, ordering and performing treatments and interventions, ordering and review of laboratory studies, ordering and review of radiographic studies, pulse oximetry and re-evaluation of patient's condition.  Ronnald Gave MSN, AGACNP-BC [place] [facility] Amion for pager 11/27/2023, 11:25 AM

## 2023-11-27 NOTE — Progress Notes (Signed)
 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 He was recently admitted 09/16/2023-09/22/2023 for osteomyelitis of the right ankle with associated hardware infection. He decided against curative BKA and underwent I&D. He was discharged on IV daptomycin  and ertapenem . He follows with ID outpatient and completed IV therapy 10/14/2023 and was transitioned to oral antibiotics with chronic suppressive doxycycline  and levaquin .   In the ED he was found to have Hgb 6.3 and platelet count 46. He received 2 PRBC with appropriate rise in hemoglobin. He was admitted to [facility] for further management.    He developed refractory hypotension despite IVF resuscitation and increased midodrine . He was restarted on IV vancomycin  and cefepime  and ultimately required vasopressor support. PCCM was consulted and he was transferred to the ICU for further management.   eICU Interventions  Chart reviewed     Intervention Category Evaluation Type: New Patient Evaluation  CLAUDENE AGENT, P 11/27/2023, 4:18 AM

## 2023-11-27 NOTE — Progress Notes (Signed)
 eLink Physician-Brief Progress Note Patient Name: MICHELL KADER DOB: 03/17/1955 MRN: [id number]   Date of Service  11/27/2023  HPI/Events of Note  Notified of uptrending critical lactic acid.  5.2 up from 2.3.  K3.4  eICU Interventions  Already receiving KCl  On fluids continuous infusion.  Trend lactic   0135 oliguril with foley and fluids in place, no intervention  0258 -significant anxiety, night terrors, maintain NPO.  Wants to get out of bed and agitated.  Low-dose olanzapine, resume home melatonin  0605 -worsening acidosis, lactic acid rising to 7.6, pressor requirements uptrending.  Initiate short interval bicarb infusion.  Hold lactated Ringer 's in the meantime  0656 -multiple type VII stools throughout the night.  Continues to saturate his dressings in the setting of sacral wound.  Has thrombocytopenia which is borderline, but benefit likely outweighs risk with comorbidities.  Proceed with Flexi-Seal.  C. difficile testing.  GI pathogen panel PCR.  500 cc LR bolus  Intervention Category Minor Interventions: Routine modifications to care plan (e.g. PRN medications for pain, fever)  Bevan Disney 11/27/2023, 7:27 PM

## 2023-11-27 NOTE — IPAL (Signed)
  Interdisciplinary Goals of Care Family Meeting   Date carried out: 11/27/2023  Location of the meeting:  phone conference and later Bedside  Member's involved: Nurse Practitioner and Social Worker  Durable Power of Attorney or acting medical decision maker: [name]     Discussion: We discussed goals of care for [name] . Harold [name] and I have spoken multiple times today about Harold [name]'s current critical illness and his contributing chronic illnesses. We talked about the challenges of his chronic conditions, and the difficult choices faced by pt/husband historically regarding treatment paths.  For a long time, pt did not want amputation. Concerns re worse mobility, poor wound healing, infection risks.  It sounds like very recently, (<1 week ago) the pt told husband he would want amputation. They have not talked with ortho about this however, and were trying to increase pts PO intake which has steadily dropped off the last several weeks and dramatically so the last week.  Harold [name] and I talked about everything we are currently doing and guarded prognosis. Mixed hypovolemic and septic shock w MSOF. I encouraged him to think about code status, and would recommend DNR/I.   Harold [name] needs time. Plan to attempt PICC placement this evening -- unable to draw labs, incr pressor requirement. With his poor mentation, impulsive movements, I think PICC is safest insertion site to attempt for central access.   Code status:   Code Status: Full Code   Disposition: Continue current acute care  Time spent for the meeting: 60 min     Harold [name] Gave, NP  11/27/2023, 4:53 PM

## 2023-11-27 NOTE — Progress Notes (Signed)
 Subjective: No acute events.  Nursing does not report any hematochezia or melena.  Objective: Vital signs in last 24 hours: Temp:  [93.6 F (34.2 C)-99.5 F (37.5 C)] 97.3 F (36.3 C) (10/09 1415) Pulse Rate:  [66-88] 67 (10/09 1415) Resp:  [10-29] 19 (10/09 1415) BP: (66-149)/(40-98) 102/56 (10/09 1415) SpO2:  [96 %-100 %] 100 % (10/09 1415) Weight:  [85.9 kg-98.2 kg] 85.9 kg (10/09 0500) Last BM Date : 11/27/23  Intake/Output from previous day: 10/08 0701 - 10/09 0700 In: 5721.4 [I.V.:3146.9; IV Piggyback:2574.5] Out: 70 [Urine:70] Intake/Output this shift: Total I/O In: 1452.9 [I.V.:840.2; IV Piggyback:612.6] Out: 60 [Urine:60]  General appearance: fatigued and moderate distress Resp: clear to auscultation bilaterally Cardio: regular rate and rhythm GI: soft, non-tender; bowel sounds normal; no masses,  no organomegaly Extremities: bandaged extremities  Lab Results: Recent Labs    11/25/23 2057 11/25/23 2132 11/26/23 0548 11/26/23 2015  WBC 8.0  --  7.6 7.5  HGB 6.3* 6.1* 8.8* 8.9*  HCT 18.7* 18.0* 26.5* 26.1*  PLT 46*  --  38* 44*   BMET Recent Labs    11/25/23 2057 11/25/23 2132 11/26/23 0548 11/26/23 2015  NA 137 139 135 137  K 3.3* 3.3* 3.1* 3.0*  CL 105 106 104 105  CO2 17*  --  14* 19*  GLUCOSE 121* 110* 146* 146*  BUN 40* 33* 43* 45*  CREATININE 2.56* 2.60* 2.67* 2.80*  CALCIUM  7.5*  --  7.3* 7.5*   LFT Recent Labs    11/25/23 2057  PROT 4.3*  ALBUMIN  1.7*  AST 16  ALT 8  ALKPHOS 83  BILITOT 1.3*   PT/INR No results for input(s): LABPROT, INR in the last 72 hours. Hepatitis Panel No results for input(s): HEPBSAG, HCVAB, HEPAIGM, HEPBIGM in the last 72 hours. C-Diff No results for input(s): CDIFFTOX in the last 72 hours. Fecal Lactopherrin No results for input(s): FECLLACTOFRN in the last 72 hours.  Studies/Results: US  EKG SITE RITE Result Date: 11/27/2023 If Site Rite image not attached, placement could not be  confirmed due to current cardiac rhythm.  CT Head Wo Contrast Result Date: 11/25/2023 EXAM: CT HEAD WITHOUT CONTRAST 11/25/2023 10:05:33 PM TECHNIQUE: CT of the head was performed without the administration of intravenous contrast. Automated exposure control, iterative reconstruction, and/or weight based adjustment of the mA/kV was utilized to reduce the radiation dose to as low as reasonably achievable. COMPARISON: None available. CLINICAL HISTORY: Mental status change, unknown cause. FINDINGS: BRAIN AND VENTRICLES: Moderate global parenchymal volume loss is slightly advanced in relation to the patient's stated age. Mild periventricular white matter changes are present, likely reflecting small vessel ischemia. Ventricular size is normal and commensurate with a degree of parenchymal volume loss. No acute hemorrhage. No evidence of acute infarct. No hydrocephalus. No extra-axial collection. No mass effect or midline shift. ORBITS: No acute abnormality. SINUSES: No acute abnormality. SOFT TISSUES AND SKULL: No acute soft tissue abnormality. No skull fracture. IMPRESSION: 1. No acute intracranial abnormality. 2. Moderate global parenchymal volume loss, slightly advanced for age. Electronically signed by: Dorethia Molt MD 11/25/2023 10:10 PM EDT RP Workstation: [id number]   DG Chest Port 1 View Result Date: 11/25/2023 EXAM: 1 VIEW XRAY OF THE CHEST 11/25/2023 09:28:00 PM COMPARISON: None available. CLINICAL HISTORY: weakness. Pt bib [facility] coming from home with CC of generalized weakness. Last week, patient has had increase in AMS. Pt A\T\Ox3 but A\T\Ox4 at baseline. Blood work was done last week with hemoglobin of 5 but patient did not get treatment  for this. Pt has swelling to ; left arm. GCS 14. weakness. Pt bib [facility] coming from home with CC of generalized weakness. Last week, patient has had increase in AMS. Pt A\T\Ox3 but A\T\Ox4 at baseline. Blood work was done last week with hemoglobin of 5 but patient did  not get treatment for this. Pt has swelling to ; left arm. GCS 14. FINDINGS: LUNGS AND PLEURA: Pulmonary hyperinflation. No pulmonary opacity. No pleural effusion. No pneumothorax. HEART AND MEDIASTINUM: Cardiac size within normal limits. BONES AND SOFT TISSUES: No acute bone abnormality. Bony vascularity is normal. IMPRESSION: 1. No acute process. 2. Pulmonary hyperinflation. Electronically signed by: Dorethia Molt MD 11/25/2023 09:32 PM EDT RP Workstation: [id number]    Medications: Scheduled:  atorvastatin   40 mg Oral Daily   Chlorhexidine  Gluconate Cloth  6 each Topical Daily   DULoxetine   60 mg Oral Daily   ezetimibe   10 mg Oral QPM   gabapentin   100 mg Oral QHS   insulin  aspart  0-5 Units Subcutaneous QHS   insulin  aspart  0-6 Units Subcutaneous TID WC   leptospermum manuka honey  1 Application Topical Daily   levothyroxine   88 mcg Oral QAC breakfast   midodrine   10 mg Oral TID WC   nitroGLYCERIN   1 inch Topical Q8H   pantoprazole  (PROTONIX ) IV  40 mg Intravenous Q12H   senna-docusate  1 tablet Oral BID   sodium chloride  flush  3 mL Intravenous Q12H   Continuous:  sodium chloride  Stopped (11/27/23 0000)   acetaminophen  Stopped (11/27/23 1148)   ceFEPime  (MAXIPIME ) IV Stopped (11/26/23 2256)   lactated ringers  100 mL/hr at 11/27/23 1400   norepinephrine (LEVOPHED) Adult infusion 6 mcg/min (11/27/23 1400)   vancomycin  Stopped (11/27/23 0049)    Assessment/Plan: 1) Anemia. 2) Heme positive stool. 3) Metabolic encephalopathy - improved. 4) Sepsis on pressors. 5) Osteomyelitis.   There is some improvement.  I spoke with Zachary, he reported that he had significant bleeding from the CGM sensor and significant bleeding from the leg wound.  Certainly this contributed to his severe anemia, however, Zachary was removed that Fredick's stool was heme positive.  He does not have any overt signs of bleeding.  Given his critical state in other pressing health issues, GI intervention will not be  pursued unless there is any overt signs of bleeding.  Plan: 1) Continue with the current care. 2) Call if needed.  LOS: 2 days   Toniyah Dilmore D 11/27/2023, 4:36 PM

## 2023-11-27 NOTE — ED Notes (Signed)
 Patient initially agreed to take PO meds with applesauce. After crushing and mixing with applesauce, patient reports he cannot swallow well enough to take meds.

## 2023-11-27 NOTE — Progress Notes (Signed)
 Responded to consult for IV. Pt on multiple medications with limited veins. Recommend considering central line for further access needs.

## 2023-11-27 NOTE — TOC Initial Note (Addendum)
 Transition of Care [facility]) - Initial/Assessment Note    Patient Details  Name: Harold [name] MRN: [id number] Date of Birth: [birth year]  [facility] CM/SW Contact:    Sudie Erminio Deems, RN Phone Number: 11/27/2023, 4:29 PM  Clinical Narrative:  Patient presented for general weakness and failure to thrive. Patient is on IV antibiotic therapy and levophed. PTA patient was from home with spouse Zachary. Spouse was at the bedside during the visit. Patient has DME hospital bed, transfer bench, and wheelchair in the home. Patient is currently active with [facility] RN/PT/OT/Aide/CSW. Per MD notes palliative consult has been placed-patient is currently a full code. Inpatient Case Manager will continue to follow for disposition needs.                    Expected Discharge Plan:  (TBD) Barriers to Discharge: Continued Medical Work up  Expected Discharge Plan and Services   Discharge Planning Services: CM Consult Post Acute Care Choice: Home Health Living arrangements for the past 2 months: Single Family Home   Prior Living Arrangements/Services Living arrangements for the past 2 months: Single Family Home Lives with:: Spouse Patient language and need for interpreter reviewed:: Yes Do you feel safe going back to the place where you live?: Yes      Need for Family Participation in Patient Care: Yes (Comment)   Current home services: DME, Homehealth aide, Home OT, Home PT, Home RN, Other (comment) (CSW) Criminal Activity/Legal Involvement Pertinent to Current Situation/Hospitalization: No - Comment as needed  Activities of Daily Living      Permission Sought/Granted Permission sought to share information with : Case Manager, Family Supports     Emotional Assessment Appearance:: Appears stated age Attitude/Demeanor/Rapport: Unable to Assess Affect (typically observed): Unable to Assess   Alcohol / Substance Use: Not Applicable Psych Involvement:  No (comment)  Admission diagnosis:  Hypoalbuminemia [E88.09] Severe protein-calorie malnutrition [E43] Thrombocytopenia [D69.6] Heme positive stool [R19.5] Failure to thrive in adult [R62.7] Chronic anemia [D64.9] Generalized weakness [R53.1] AKI (acute kidney injury) [N17.9] Symptomatic anemia [D64.9] Stage 3b chronic kidney disease (HCC) [N18.32] Acute on chronic anemia [D64.9] Hypotension due to hypovolemia [E86.1] Patient Active Problem List   Diagnosis Date Noted   Failure to thrive in adult 11/27/2023   AKI (acute kidney injury) 11/27/2023   Thrombocytopenia 11/26/2023   Acute on chronic anemia 11/25/2023   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   Acute osteomyelitis of calcaneum (HCC) 11/03/2023   Type 2 diabetes mellitus with diabetic polyneuropathy, with long-term current use of insulin  (HCC) 09/17/2023   Septic arthritis of ankle and foot region (HCC) 09/16/2023   Type 2 diabetes mellitus with diabetic nephropathy, with long-term current use of insulin  (HCC) 09/16/2023   Stage 3a chronic kidney disease (HCC) 09/16/2023   Hypokalemia 07/30/2023   History of COPD 07/30/2023   History of peripheral vascular disease 07/30/2023   Non-insulin  dependent type 2 diabetes mellitus (HCC) 07/30/2023   BPH (benign prostatic hyperplasia) 07/30/2023   Major depressive disorder, recurrent episode, moderate (HCC) 07/19/2023   PICC (peripherally inserted central catheter) in place 07/18/2023   Dizziness 07/17/2023   Hypotension 07/17/2023   Infection of lower extremity associated with hardware 06/19/2023   Hyponatremia 06/19/2023   Open wound of right ankle 06/18/2023   Severe protein-calorie malnutrition 05/21/2023   Open right ankle fracture, sequela 05/12/2023   Obesity, Class II, BMI 35-39.9 04/23/2023   Osteomyelitis (HCC) 04/17/2023   Cellulitis of right lower extremity 03/28/2023  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, right (HCC) 03/28/2023   Abscess of skin of  right ankle 03/28/2023   PAD (peripheral artery disease) 03/28/2023   Vitamin D  deficiency 01/07/2023   QT prolongation 01/02/2023   Acute renal failure superimposed on stage 3b chronic kidney disease (HCC) 01/02/2023   Normocytic anemia 01/02/2023   Paroxysmal atrial fibrillation (HCC) 01/02/2023   Uncontrolled type 2 diabetes mellitus with hyperglycemia, with long-term current use of insulin  (HCC) 01/02/2023   Hypothyroidism 01/02/2023   CKD stage 3b, GFR 30-44 ml/min (HCC) 11/07/2022   CAD in native artery 11/07/2022   PVD (peripheral vascular disease) 11/06/2022   Hypercoagulable state due to persistent atrial fibrillation (HCC) 02/19/2022   Persistent atrial fibrillation (HCC) 01/28/2022   Lumbar stenosis with neurogenic claudication 07/26/2020   Diabetic neuropathy (HCC) 08/24/2019   Trigger thumb of left hand 09/02/2018   Atherosclerosis of native artery of both lower extremities with intermittent claudication 05/12/2018   Pain of left hand 03/26/2018   Trigger finger 03/03/2017   Pain in finger of left hand 03/03/2017   Snoring 08/20/2016   Morbid (severe) obesity due to excess calories (HCC) 04/18/2016   Venous stasis of both lower extremities - with edema 02/25/2015   Right-sided chest wall pain 05/08/2014   Gastroesophageal reflux disease without esophagitis 10/10/2013   Chronic heart failure with preserved ejection fraction (HFpEF) (HCC) 06/27/2013   COPD (chronic obstructive pulmonary disease) (HCC) 06/17/2013   Restrictive lung disease 06/17/2013   Obesity, Class III, BMI 40-49.9 (morbid obesity) (HCC) 09/01/2012   Generalized weakness 09/01/2012   Chronic renal disease, stage IV (HCC) 04/30/2012   OSA on CPAP 04/29/2012   Pulmonary hypertension, 04/29/2012   Dyspnea on exertion   04/26/2012   CAD, LAD/CFX DES 04/28/12- staged RCA DES 04/29/12 after pt declined CABG, cath 05/14/13 stable CAD medical therapy    Carotid artery stenosis without cerebral infarction,  bilateral 11/07/2011   Diabetes mellitus type 2 with neurological manifestations (HCC) 04/29/2011   Essential hypertension 04/29/2011   Neuropathy 04/29/2011   Hyperlipidemia associated with type 2 diabetes mellitus (HCC) 04/29/2011   Anxiety and depression 04/29/2011   PCP:  Patient, No Pcp Per Pharmacy:   [id number] [address], [place] - [address] [place] [address] Phone: [phone] Fax: [phone]     Social Drivers of Health (SDOH) Social History: SDOH Screenings   Food Insecurity: No Food Insecurity (11/27/2023)  Housing: Low Risk  (11/27/2023)  Transportation Needs: Unmet Transportation Needs (11/27/2023)  Utilities: Not At Risk (11/27/2023)  Depression (PHQ2-9): High Risk (11/18/2023)  Social Connections: Moderately Isolated (11/27/2023)  Tobacco Use: Medium Risk (11/26/2023)   SDOH Interventions:     Readmission Risk Interventions     No data to display

## 2023-11-27 NOTE — Consult Note (Signed)
 WOC Nurse Consult Note: Multiple wounds, sepsis on admission in the setting of malnutrition, osteomyelitis to right tibia and calcaneous.  Refused amputation last admission, now agreeable but awaiting orthopedics evaluation due to deconditioning and malnutrition.  History PAD. Reason for Consult:Multiple wounds  Wound type:Pressure, moisture, trauma, infectious, nonhealing Pressure Injury POA: Yes  Sacrum Measurement:sacrum:Stage 3   5 cm x 3 cm  x 0.3 cm  with surrounding scattered nonintact areas to bilateral gluteal folds consistent with moisture associated skin damage Penis with full thickness breakdown, unclear if from moisture and being in a brief, could be from condom catheter usage.  Slough to wound bed, nonintact circumferentially.  Left forearm with extravasation site:  pharmacy aware and has implemented Phentalomine and nitroglycerin  cream per protocol.  Ongoing monitoring.  Edematous and maroon.  Right lateral malleolus (infected hardware site) 10 cm x 1 cm x 0.2 cm  Left posterior leg 6 cm x 4 cm eschar Right forearm with 4 cm round bruising and nonintact distal lesion 1 cm x 0.5 cm x 0.2 cm ruddy red Right hand edematous and bruising noted Bilateral lower legs are darkened on anterior surface Wound bed:see above Drainage (amount, consistency, odor) minimal serosanguinous   Periwound:Darkened integument to lower legs.  Bruising to hands, arms with scattered abrasions.  Inner thighs with partial thickness breakdown, consistent with friction from disposable brief.  Will implement barrier cream. No disposable briefs Dressing procedure/placement/frequency:Right and left lateral leg:  Cleanse with NS and pat dry. Apply Xeroform gauze and cover with dry gauze and kerlix. Change daily.  Penis:  Cleanse with VASHE and air dry. Apply Medihoney to open wounds.  Cover with 2 kling and tape daily.   Apply barrier cream to perineum and inner thighs each shift and PRN soilage.  Will not follow  at this time.  Please re-consult if needed.  Darice Cooley MSN, RN, FNP-BC CWON Wound, Ostomy, Continence Nurse Outpatient [facility] [phone] Work cell phone:  [phone]

## 2023-11-27 NOTE — Progress Notes (Signed)
 Left upper extremity venous duplex has been completed.  Results can be found in chart review under CV Proc.  11/27/2023 5:27 PM  Edilia Elden Appl, RVT.

## 2023-11-27 NOTE — Progress Notes (Signed)
 Patient ID: Harold [name], male   DOB: September 25, 1955, 68 y.o.   MRN: [id number]  Received call regarding pt well-known to orthopedic trauma. He (and husband) have resisted multiple recommendations for amputation despite being warned that this exact scenario could occur. Reluctant to consider amputation unless pt can provide first-person consent given previous adamant refusals. Could consider BKA Monday if pt improves and desires procedure or earlier if multidisciplinary teams think is necessary for survival and husband wishes to pursue.    Ozell DOROTHA Ned, PA-C Orthopedic Surgery [phone]

## 2023-11-27 NOTE — Evaluation (Signed)
 Clinical/Bedside Swallow Evaluation Patient Details  Name: FRANCHOT POLLITT MRN: [id number] Date of Birth: 1971/01/09  Today's Date: 11/27/2023 Time: SLP Start Time (ACUTE ONLY): 1010 SLP Stop Time (ACUTE ONLY): 1022 SLP Time Calculation (min) (ACUTE ONLY): 12 min  Past Medical History:  Past Medical History:  Diagnosis Date   Anemia    Anginal pain 04/2013   Cardiac cath showed patent stents with distal LAD, circumflex-OM and RCA disease in small vessels.   Anxiety    Atrial fibrillation (HCC) 12/2021   CAD S/P percutaneous coronary angioplasty 2003, 04/2012   status post PCI to LAD, circumflex-OM 2, RCA   Carotid artery occlusion    Chronic venous insufficiency    varicosities, no reflux; dopplers 04/14/12- valvular insufficiency in the R and L GSV   Complication of anesthesia    COPD, mild (HCC)    Depression    situaltional    Diabetes mellitus    Diabetic neuropathy (HCC) 08/24/2019   Dyslipidemia associated with type 2 diabetes mellitus (HCC)    Fall at home, initial encounter 01/02/2023   GERD (gastroesophageal reflux disease)    History of cardioversion 12/2021   at [facility]   HTN (hypertension)    Hypothyroidism    Neuromuscular disorder (HCC)    neuropathy in feet   Neuropathy    notably improved following PCI with improved cardiac function   Obesity    Obesity, Class II, BMI 35.0-39.9, with comorbidity (see actual BMI)    BMI 39; wgt loss efforts in place; seeing Dietician   Open ankle fracture 01/02/2023   Orthostatic hypotension    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   PONV (postoperative nausea and vomiting)    [place]   Pulmonary hypertension (HCC) 04/2012   PA pressure   Sleep apnea    uses nightly   Vitamin D  deficiency    per facility notes   Past Surgical History:  Past Surgical History:  Procedure Laterality Date   ABDOMINAL AORTAGRAM N/A 11/15/2011   Procedure: ABDOMINAL EZELLA;  Surgeon: Carlin FORBES Haddock, MD;   Location: [facility] CATH LAB;  Service: Cardiovascular;  [name];   ABDOMINAL AORTOGRAM W/LOWER EXTREMITY N/A 10/13/2019   Procedure: ABDOMINAL AORTOGRAM W/LOWER EXTREMITY;  Surgeon: Darron Deatrice LABOR, MD;  Location: [facility] INVASIVE CV LAB;  Service: CV: Non-obst Ao-Iliac. R SFA-PopA patent with significant calcific stenosis of TP trunk-prox PTA (dominant) & CTO ATA  -> R PTA-TP PTCA   ABDOMINAL AORTOGRAM W/LOWER EXTREMITY N/A 11/06/2022   Procedure: ABDOMINAL AORTOGRAM W/LOWER EXTREMITY;  Surgeon: Darron Deatrice LABOR, MD;  Location: [facility] INVASIVE CV LAB;  Service: Cardiovascular;  [name];   ABDOMINAL AORTOGRAM W/LOWER EXTREMITY Right 04/01/2023   Procedure: ABDOMINAL AORTOGRAM W/LOWER EXTREMITY;  Surgeon: Serene Gaile ORN, MD;  Location: [facility] INVASIVE CV LAB;  Service: Cardiovascular;  Laterality: Right;   ABIs  04/27/2012   mild bilateral arterial insufficiency   ANKLE FUSION Right 05/15/2023   Procedure: ARTHRODESIS ANKLE WITH HINDFOOT FUSION NAIL;  Surgeon: Kendal Franky SQUIBB, MD;  [name];  Service: Orthopedics;  Laterality: Right;   APPLICATION OF WOUND VAC Right 03/31/2023   Procedure: APPLICATION OF WOUND VAC;  Surgeon: Kendal Franky SQUIBB, MD;  [name];  Service: Orthopedics;  Laterality: Right;   BACK SURGERY  2005 x1   X2-2010   BUNIONECTOMY Right 11/11/2013   Procedure: RIGHT FOOT SILVER BUNIONECTOMY;  Surgeon: Norleen Armor, MD;  Location: [facility];  Service: Orthopedics;  Laterality: Right;   CARDIAC  CATHETERIZATION  12/11/2001   significant 3V CAD, normal LV function   CARDIOVERSION N/A 03/13/2022   Procedure: CARDIOVERSION;  Surgeon: Kate Lonni CROME, MD;  Location: [facility] ENDOSCOPY;  Service: Cardiovascular;  [name];   CARDIOVERSION N/A 07/25/2022   Procedure: CARDIOVERSION;  Surgeon: Jeffrie Oneil BROCKS, MD;  Location: [facility] INVAS[facility] CV LAB;  Service: Cardiovascular;  [name];   Carotid Doppler  04/27/2012   right internal carotid: Elevated  velocities but no evidence of plaque. Left internal carotid 40-59%   CAROTID ENDARTERECTOMY  2005   Right; recent carotid Dopplers notes elevated velocities.    colonscopy     CORONARY ANGIOPLASTY  12/21/2001   PTCA of the distal and mid AV groove circ, unsuccessful PTCA of second OM total occlusion, unsuccessful PTCA of the apical LAD total occlusion   CORONARY ANGIOPLASTY WITH STENT PLACEMENT  04/29/2012   PCI to 3 RCA lesions, Promus Premiere 2.41mmx8mm distally, mid was 2.74mx28mm and proximally 2.75x57mm, EF 55-60%   CORONARY STENT PLACEMENT  04/28/2012   PCI to LAD (3x33mm Xience DES postdilated to 3.25) and circ prox and mid (2 overlappinmg 2.28mmx12mmXience DES postdilated to 2.62mm)   DOPPLER ECHOCARDIOGRAPHY  04/28/2012   poor quality study: EF estimated 60-65%; unable to assess diastolic function (previously noted to have diastolic dysfunction); severely dilated left atrium and mild right atrium; dilated IVC consistent with increased central venous pressure.SABRA   EXTERNAL FIXATION LEG Right 05/12/2023   Procedure: EXTERNAL FIXATION, LOWER EXTREMITY;  Surgeon: Kendal Franky SQUIBB, MD;  [name];  Service: Orthopedics;  Laterality: Right;   HARDWARE REMOVAL Right 03/31/2023   Procedure: HARDWARE REMOVAL RIGHT ANKLE;  Surgeon: Kendal Franky SQUIBB, MD;  [name];  Service: Orthopedics;  Laterality: Right;   HARDWARE REMOVAL Right 01/28/2023   Procedure: REMOVAL OF HARDWARE RIGHT ANKLE;  [name], MD;  [name];  Service: Orthopedics;  Laterality: Right;   HARDWARE REMOVAL Right 06/21/2023   Procedure: REMOVAL, HARDWARE;  Surgeon: Kendal Franky SQUIBB, MD;  [name];  Service: Orthopedics;  Laterality: Right;   HERNIA REPAIR     I & D EXTREMITY Right 01/02/2023   Procedure: IRRIGATION AND DEBRIDEMENT RIGHT ANKLE;  [name], MD;  [name];  Service: Orthopedics;  Laterality: Right;   I & D EXTREMITY Right 04/02/2023   Procedure: DEBRIDEMENT  RIGHT ANKLE;  Surgeon: Harden Jerona GAILS, MD;  Location: [facility] OR;  Service: Orthopedics;  Laterality: Right;   INCISION AND DRAINAGE OF WOUND Right 09/19/2023   Procedure: IRRIGATION AND DEBRIDEMENT WOUND;  Surgeon: Kendal Franky SQUIBB, MD;  [name];  Service: Orthopedics;  Laterality: Right;   IR FLUORO GUIDE CV LINE LEFT  04/22/2023   IR FLUORO GUIDE CV LINE RIGHT  04/07/2023   IR PATIENT EVAL TECH 0-60 MINS  04/07/2023   IR RADIOLOGIST EVAL & MGMT  04/08/2023   IR REMOVAL TUN CV CATH W/O FL  05/05/2023   IR US  GUIDE VASC ACCESS LEFT  04/22/2023   IR US  GUIDE VASC ACCESS RIGHT  04/07/2023   LEFT AND RIGHT HEART CATHETERIZATION WITH CORONARY ANGIOGRAM N/A 04/27/2012   Procedure: LEFT AND RIGHT HEART CATHETERIZATION WITH CORONARY ANGIOGRAM;  [name], MD;  Location: [facility] CATH LAB;  Service: Cardiovascular;  [name];   LEFT AND RIGHT HEART CATHETERIZATION WITH CORONARY ANGIOGRAM N/A 05/14/2013   Procedure: LEFT AND RIGHT HEART CATHETERIZATION WITH CORONARY ANGIOGRAM;  [name], MD;  Location: [facility] CATH LAB: Moderate Pulm HTN:  46/16 - mean 33 mmHg; PCWP ;; [name], [name], Patent RCA ostial mid and distal stents - 70% distal runoff Dz   LUMBAR LAMINECTOMY/DECOMPRESSION MICRODISCECTOMY  01/07/2012   Procedure: LUMBAR LAMINECTOMY/DECOMPRESSION MICRODISCECTOMY 1 LEVEL;  [name], MD;  [name];  Service: Neurosurgery;  Laterality: Bilateral;   Lumbar Three-Four Decompression   LUMBAR LAMINECTOMY/DECOMPRESSION MICRODISCECTOMY N/A 07/26/2020   Procedure: Lumbar two-three Laminectomy/Foraminotomy;  [name], MD;  [name];  Service: Neurosurgery;  [name];   ORIF ANKLE FRACTURE Right 01/02/2023   Procedure: OPEN REDUCTION INTERNAL FIXATION (ORIF)RIGHT ANKLE FRACTURE;  [name], MD;  [name];  Service:  Orthopedics;  Laterality: Right;   PERCUTANEOUS CORONARY STENT INTERVENTION (PCI-S) N/A 04/28/2012   Procedure: PERCUTANEOUS CORONARY STENT INTERVENTION (PCI-S);  [name], MD;  Location: [facility] CATH LAB;  Service: Cardiovascular;  [name];   PERCUTANEOUS CORONARY STENT INTERVENTION (PCI-S) N/A 04/29/2012   Procedure: PERCUTANEOUS CORONARY STENT INTERVENTION (PCI-S);  [name], MD;  Location: [facility] CATH LAB;  Service: Cardiovascular;  [name];   PERIPHERAL VASCULAR ATHERECTOMY  11/06/2022   Procedure: PERIPHERAL VASCULAR ATHERECTOMY;  Surgeon: Darron Deatrice DELENA, MD;  [facility] INVAS[facility] CV LAB;  Service: Cardiovascular;;   PERIPHERAL VASCULAR BALLOON ANGIOPLASTY Right 10/13/2019   Procedure: PERIPHERAL VASCULAR BALLOON ANGIOPLASTY;  Surgeon: Darron Deatrice DELENA, MD;  Location: [facility] INVAS[facility] CV LAB;  Service: Cardiovascular;  Laterality: Right;  PTCA of R TP Trunk into PTA (Drug-coated) => Follow-up ABIs 0.9 on the right and 0.99 on the left.   PERIPHERAL VASCULAR BALLOON ANGIOPLASTY Right 04/01/2023   Procedure: PERIPHERAL VASCULAR BALLOON ANGIOPLASTY;  Surgeon: Serene Gaile LELON, MD;  Location: [facility] INVAS[facility] CV LAB;  Service: Cardiovascular;  Laterality: Right;   PERIPHERAL VASCULAR INTERVENTION Right 04/01/2023   Procedure: PERIPHERAL VASCULAR INTERVENTION;  Surgeon: Serene Gaile LELON, MD;  Location: [facility] INVAS[facility] CV LAB;  Service: Cardiovascular;  Laterality: Right;   SPINE SURGERY     UMBILICAL HERNIA REPAIR  2009   steel mesh insert   HPI:  Mr. Erazo is a 68 year old gentleman with who presented 10/8 with generalized weakness, fatigue, loss of appetite and anemia. 10/9: Developed worsening hypotension and altered mental status, resumed on broad spectrum IV antibiotics, required vasopressor support. Shock associated with osteomyelitis. 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 Pt did not complete oral transit, distracted by pain. Oral care and suction retrieved PO and dried pharyngeal secretions. Pt unable to consistently take medications, nutrition or hydration today and likely into the near future. Will f/u for trials. SLP Visit Diagnosis: Dysphagia, unspecified (R13.10)    Aspiration Risk  Risk for inadequate nutrition/hydration    Diet Recommendation NPO;Alternative means - temporary    Medication Administration: Via alternative means    Other  Recommendations Oral Care Recommendations: Oral care QID Caregiver Recommendations: Have oral suction available     Assistance Recommended at Discharge    Functional Status Assessment Patient has had a recent decline in their functional  status and demonstrates the ability to make significant improvements in function in a reasonable and predictable amount of time.  Frequency and Duration            Prognosis Prognosis for improved oropharyngeal function: Good      Swallow Study   General HPI: Mr. Jabs is a 68 year old gentleman with who presented 10/8 with generalized weakness, fatigue, loss of appetite and anemia. 10/9: Developed worsening hypotension and altered mental status, resumed on broad spectrum IV antibiotics, required vasopressor support. Shock associated with osteomyelitis. 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 Type of Study: Bedside Swallow Evaluation Previous Swallow  Assessment: none Diet Prior to this Study: NPO Temperature Spikes Noted: No Respiratory Status: Nasal cannula History of Recent Intubation: No Behavior/Cognition: Distractible;Lethargic/Drowsy Oral Cavity Assessment: Dry;Dried secretions Oral Care Completed by SLP: Yes Oral Cavity - Dentition: Adequate natural dentition Vision: Functional for self-feeding Self-Feeding Abilities: Total assist Patient Positioning: Upright in bed Baseline Vocal Quality: Normal Volitional Cough: Cognitively unable to elicit Volitional Swallow: Unable to elicit    Oral/Motor/Sensory Function Overall Oral Motor/Sensory Function: Generalized oral weakness Lingual ROM: Reduced left;Reduced right   Ice Chips Ice chips: Impaired Presentation: Spoon Oral Phase Impairments: Poor awareness of bolus Oral Phase Functional Implications: Oral residue;Oral holding Pharyngeal Phase Impairments: Unable to trigger swallow   Thin Liquid Thin Liquid: Impaired Presentation: Straw Oral Phase Impairments: Poor awareness of bolus Pharyngeal  Phase Impairments: Suspected delayed Swallow    Nectar Thick Nectar Thick Liquid: Not tested   Honey Thick Honey Thick Liquid: Not tested   Puree Puree: Impaired Presentation: Spoon Oral Phase Impairments: Reduced lingual movement/coordination;Poor awareness of bolus Pharyngeal Phase Impairments: Suspected delayed Swallow   Solid     Solid: Not tested      Anina Schnake, Consuelo Fitch 11/27/2023,3:14 PM

## 2023-11-27 NOTE — Progress Notes (Signed)
 Peripherally Inserted Central Catheter Placement  The IV Nurse has discussed with the patient and/or persons authorized to consent for the patient, the purpose of this procedure and the potential benefits and risks involved with this procedure.  The benefits include less needle sticks, lab draws from the catheter, and the patient may be discharged home with the catheter. Risks include, but not limited to, infection, bleeding, blood clot (thrombus formation), and puncture of an artery; nerve damage and irregular heartbeat and possibility to perform a PICC exchange if needed/ordered by physician.  Alternatives to this procedure were also discussed.  Bard Power PICC patient education guide, fact sheet on infection prevention and patient information card has been provided to patient /or left at bedside.    PICC Placement Documentation  PICC Triple Lumen 11/27/23 Right Brachial 36 cm 0 cm (Active)  Indication for Insertion or Continuance of Line Vasoactive infusions 11/27/23 1726  Exposed Catheter (cm) 0 cm 11/27/23 1726  Site Assessment Clean, Dry, Intact 11/27/23 1726  Lumen #1 Status Saline locked;Blood return noted 11/27/23 1726  Lumen #2 Status Saline locked;Blood return noted 11/27/23 1726  Lumen #3 Status Saline locked;Blood return noted 11/27/23 1726  Dressing Type Transparent;Securing device 11/27/23 1726  Dressing Status Antimicrobial disc/dressing in place;Clean, Dry, Intact 11/27/23 1726  Line Care Connections checked and tightened 11/27/23 1726  Line Adjustment (NICU/IV Team Only) No 11/27/23 1726  Dressing Intervention New dressing 11/27/23 1726  Dressing Change Due 12/04/23 11/27/23 1726    Consent obtained from spouse at bedside.   Harold [name] 11/27/2023, 5:31 PM

## 2023-11-27 NOTE — Plan of Care (Signed)
  Problem: Health Behavior/Discharge Planning: Goal: Ability to manage health-related needs will improve Outcome: Progressing   Problem: Metabolic: Goal: Ability to maintain appropriate glucose levels will improve Outcome: Progressing   Problem: Skin Integrity: Goal: Risk for impaired skin integrity will decrease Outcome: Progressing   Problem: Tissue Perfusion: Goal: Adequacy of tissue perfusion will improve Outcome: Progressing   Problem: Activity: Goal: Risk for activity intolerance will decrease Outcome: Progressing   Problem: Coping: Goal: Level of anxiety will decrease Outcome: Progressing

## 2023-11-27 NOTE — Progress Notes (Signed)
 Pharmacy called for suspected IV extravasation. Pt LFA was red/bruised/edematous on admission but progressed throughout shift. Pt arm became increasingly edematous. Pt did not have any pain in arm, both IV sites were able to draw back easily and were removed. Phentolamine given and nitroglycerin  cream applied per pharmacy and CCM orders. LUE elevated.

## 2023-11-28 DIAGNOSIS — R197 Diarrhea, unspecified: Secondary | ICD-10-CM

## 2023-11-28 DIAGNOSIS — I4891 Unspecified atrial fibrillation: Secondary | ICD-10-CM

## 2023-11-28 LAB — BASIC METABOLIC PANEL WITH GFR
Anion gap: 20 — ABNORMAL HIGH (ref 5–15)
BUN: 49 mg/dL — ABNORMAL HIGH (ref 8–23)
CO2: 11 mmol/L — ABNORMAL LOW (ref 22–32)
Calcium: 7.1 mg/dL — ABNORMAL LOW (ref 8.9–10.3)
Chloride: 97 mmol/L — ABNORMAL LOW (ref 98–111)
Creatinine, Ser: 3.44 mg/dL — ABNORMAL HIGH (ref 0.61–1.24)
GFR, Estimated: 18 mL/min — ABNORMAL LOW (ref >60)
Glucose, Bld: 381 mg/dL — ABNORMAL HIGH (ref 70–99)
Potassium: 3.5 mmol/L (ref 3.5–5.1)
Sodium: 128 mmol/L — ABNORMAL LOW (ref 135–145)

## 2023-11-28 LAB — CBC
HCT: 27.7 % — ABNORMAL LOW (ref 39.0–52.0)
Hemoglobin: 9.3 g/dL — ABNORMAL LOW (ref 13.0–17.0)
MCH: 29.4 pg (ref 26.0–34.0)
MCHC: 33.6 g/dL (ref 30.0–36.0)
MCV: 87.7 fL (ref 80.0–100.0)
Platelets: 57 K/uL — ABNORMAL LOW (ref 150–400)
RBC: 3.16 MIL/uL — ABNORMAL LOW (ref 4.22–5.81)
RDW: 15.7 % — ABNORMAL HIGH (ref 11.5–15.5)
WBC: 20.9 K/uL — ABNORMAL HIGH (ref 4.0–10.5)
nRBC: 3.8 % — ABNORMAL HIGH (ref 0.0–0.2)

## 2023-11-28 LAB — MAGNESIUM: Magnesium: 1.9 mg/dL (ref 1.7–2.4)

## 2023-11-28 LAB — GLUCOSE, CAPILLARY
Glucose-Capillary: 166 mg/dL — ABNORMAL HIGH (ref 70–99)
Glucose-Capillary: 347 mg/dL — ABNORMAL HIGH (ref 70–99)
Glucose-Capillary: 90 mg/dL (ref 70–99)

## 2023-11-28 LAB — LACTIC ACID, PLASMA
Lactic Acid, Venous: 6.4 mmol/L (ref 0.5–1.9)
Lactic Acid, Venous: 7.6 mmol/L (ref 0.5–1.9)

## 2023-11-28 MED ORDER — POLYVINYL ALCOHOL 1.4 % OP SOLN: 1.0000 [drp] | Freq: Four times a day (QID) | OPHTHALMIC | Status: DC | PRN

## 2023-11-28 MED ORDER — FENTANYL 2500MCG IN NS 250ML (10MCG/ML) PREMIX INFUSION
0.0000 ug/h | INTRAVENOUS | Status: DC
  Administered 2023-11-28: 50 ug/h via INTRAVENOUS
  Filled 2023-11-28: qty 250

## 2023-11-28 MED ORDER — MELATONIN 3 MG PO TABS
3.0000 mg | ORAL_TABLET | Freq: Every evening | ORAL | Status: DC | PRN
Start: 2023-11-28 — End: 2023-11-28

## 2023-11-28 MED ORDER — GLYCOPYRROLATE 0.2 MG/ML IJ SOLN: 0.2000 mg | INTRAMUSCULAR | Status: DC | PRN

## 2023-11-28 MED ORDER — STERILE WATER FOR INJECTION IJ SOLN
INTRAMUSCULAR | Status: AC
  Filled 2023-11-28: qty 10

## 2023-11-28 MED ORDER — ACETAMINOPHEN 325 MG PO TABS: 650.0000 mg | ORAL_TABLET | Freq: Four times a day (QID) | ORAL | Status: DC | PRN

## 2023-11-28 MED ORDER — LACTATED RINGERS IV BOLUS
500.0000 mL | Freq: Once | INTRAVENOUS | Status: AC
  Administered 2023-11-28: 500 mL via INTRAVENOUS

## 2023-11-28 MED ORDER — FENTANYL BOLUS VIA INFUSION
100.0000 ug | INTRAVENOUS | Status: DC | PRN
  Administered 2023-11-28 (×2): 100 ug via INTRAVENOUS

## 2023-11-28 MED ORDER — LACTATED RINGERS IV SOLN: INTRAVENOUS | Status: DC

## 2023-11-28 MED ORDER — MIDAZOLAM HCL 2 MG/2ML IJ SOLN: 2.0000 mg | INTRAMUSCULAR | Status: DC | PRN

## 2023-11-28 MED ORDER — OLANZAPINE 10 MG IM SOLR
2.5000 mg | Freq: Once | INTRAMUSCULAR | Status: AC
  Administered 2023-11-28: 2.5 mg via INTRAVENOUS
  Filled 2023-11-28: qty 10

## 2023-11-28 MED ORDER — ACETAMINOPHEN 650 MG RE SUPP: 650.0000 mg | Freq: Four times a day (QID) | RECTAL | Status: DC | PRN

## 2023-11-28 MED ORDER — STERILE WATER FOR INJECTION IV SOLN
INTRAVENOUS | Status: DC
  Filled 2023-11-28: qty 150

## 2023-11-28 MED ORDER — GLYCOPYRROLATE 1 MG PO TABS: 1.0000 mg | ORAL_TABLET | ORAL | Status: DC | PRN

## 2023-11-28 MED ORDER — HALOPERIDOL LACTATE 5 MG/ML IJ SOLN: 2.5000 mg | INTRAMUSCULAR | Status: DC | PRN

## 2023-11-28 MED ORDER — SODIUM CHLORIDE 0.9 % IV SOLN: INTRAVENOUS | Status: DC

## 2023-11-30 LAB — CULTURE, BLOOD (ROUTINE X 2)
Culture: NO GROWTH
Culture: NO GROWTH

## 2023-12-20 NOTE — Progress Notes (Signed)
 Pt. TOD [id number] on 12/07/2023. Pronounced by Nat Rubin, RN and Raguel Mower, RN. Pt. Next of kin and CCM provider at bedside. All questions answered. Pt. Had no belongings at bedside.

## 2023-12-20 NOTE — Progress Notes (Signed)
 NAME:  Harold [name], MRN:  [id number], DOB:  [birth year], LOS: 3 ADMISSION DATE:  11/25/2023, CONSULTATION DATE:  11/26/2023 REFERRING MD:  Dr. 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 Of note he was also recently admitted 09/16/2023-09/22/2023 for osteomyelitis of the right ankle with associated hardware infection. He decided against curative BKA and underwent I&D. He was discharged on IV daptomycin  and ertapenem . He follows with ID outpatient and completed IV therapy 10/14/2023 and was transitioned to oral antibiotics with chronic suppressive doxycycline  and levaquin .    In the ED he was found to have Hgb 6.3 and platelet count 46. He received 2 PRBC with appropriate rise in hemoglobin. He was admitted to [facility] for further management. Prior to admission he had refused to come to the hospital despite Hgb of 5 on outpatient blood work.   Throughout the course of the day 10/8 he developed refractory hypotension despite IVF resuscitation and increased midodrine . He was restarted on IV vancomycin  and cefepime  and ultimately required vasopressor support. PCCM was consulted and he was transferred to the ICU for further management.   Pertinent  Medical History  Per above  Significant Hospital Events: Including procedures, antibiotic start and stop dates in addition to other pertinent events   10/8: Admitted to medicine service for anemia 10/9: Developed worsening hypotension and altered mental status, resumed on broad spectrum IV antibiotics, required vasopressor support, PCCM consulted  and transferred to ICU for further management  10/9 ortho consult, picc placement 10/10 ongoing decline w worse MSOF, worse acidosis, worse delirium, worse shock. GOC discussion occurred and plan reached to transition to comfort focussed care + change code status to DNR   Interim History / Subjective:  Further declined overnight   Objective    Blood pressure 101/72, pulse 89, temperature 99.1 F (37.3 C), resp. rate (!) 21, height 6' 1 (1.854 m), weight 85.9 kg, SpO2 100%.    FiO2 (%):  [21 %] 21 %   Intake/Output Summary (Last 24 hours) at 2023-12-05 0857 Last data filed at December 05, 2023 0500 Gross per 24 hour  Intake 4429.67 ml  Output 255 ml  Net 4174.67 ml   Filed Weights   11/26/23 2205 11/27/23 0500  Weight: 98.2 kg 85.9 kg    Examination: General: chronically and critically ill appearing elderly M  Neuro: awake, lethargic. Following commands. Oriented x 1-2 HENT: NCAT. Dry mm and tongue.  Lungs: even unlabored. Clear  Cardiovascular: rrr  Abdomen: soft, non-tender, non-distended Extremities: BUE BLE pitting edema. LUE wrapped.    Resolved problem list   Assessment and Plan   Goals of care discussion / Encounter for palliative care  DNR  Acute metabolic encephalopathy Mixed hypovolemic and septic shock RLE osteomyelitis Lactic acidosis  Diarrhea  pAF Chronic AC Diastolic HF CAD AKI on CKD NAGMA BPH  AoC anemia Thrombocytopenia Hypothyroidism DM2 FTT in adult Physical deconditioning Malnutrition  Pressure injuries POA P -10/9-10/10 the patient continued to decompensate, with worsening shock, acidosis and MSOF -I spoke w [name] at the bedside re [name]'s interval changes and rapid decline. We discussed code status and goals of care. -decision reached to  change code status to DNR and transition to comfort care  -dc labs, non-comfort meds, imaging (stop NE after fent has been started/titrated) -fent gtt, PRN anxiolytic, etc  -expect  in-hospital death, likely hours to days. Pending course 10/0, possible transfer out of ICU to palliative floor but will see how he does when more comfortable + off NE    Labs   CBC: Recent Labs  Lab 11/25/23 2057 11/25/23 2132 11/26/23 0548 11/26/23 2015 11/27/23 1756 Dec 13, 2023 0514  WBC 8.0  --  7.6 7.5 16.9* 20.9*  NEUTROABS  --   --  6.6  --   --   --   HGB 6.3* 6.1* 8.8* 8.9* 9.4* 9.3*  HCT 18.7* 18.0* 26.5* 26.1* 27.6* 27.7*  MCV 88.6  --  88.9 86.4 86.5 87.7  PLT 46*  --  38* 44* 60*  62* 57*    Basic Metabolic Panel: Recent Labs  Lab 11/25/23 2057 11/25/23 2119 11/25/23 2132 11/26/23 0547 11/26/23 0548 11/26/23 2015 11/27/23 1756 12/13/23 0514  NA 137  --  139  --  135 137 135  134* 128*  K 3.3*  --  3.3*  --  3.1* 3.0* 3.4*  3.5 3.5  CL 105  --  106  --  104 105 103  103 97*  CO2 17*  --   --   --  14* 19* 15*  14* 11*  GLUCOSE 121*  --  110*  --  146* 146* 224*  223* 381*  BUN 40*  --  33*  --  43* 45* 48*  49* 49*  CREATININE 2.56*  --  2.60*  --  2.67* 2.80* 3.34*  3.33* 3.44*  CALCIUM  7.5*  --   --   --  7.3* 7.5* 7.6*  7.6* 7.1*  MG  --  1.5*  --   --  1.4* 1.8 1.8 1.9  PHOS  --   --   --  3.2  --  3.4  --   --    GFR: Estimated Creatinine Clearance: 22.6 mL/min (A) (by C-G formula based on SCr of 3.44 mg/dL (H)). Recent Labs  Lab 11/26/23 0548 11/26/23 2015 11/27/23 1756 Dec 13, 2023 0046 2023/12/13 0514  WBC 7.6 7.5 16.9*  --  20.9*  LATICACIDVEN  --  2.3* 5.2* 6.4* 7.6*    Liver Function Tests: Recent Labs  Lab 11/25/23 2057 11/27/23 1756  AST 16 25  ALT 8 8  ALKPHOS 83 67  BILITOT 1.3* 1.2  PROT 4.3* 4.2*  ALBUMIN  1.7* 2.0*   No results for input(s): LIPASE, AMYLASE in the last 168 hours. Recent Labs  Lab 11/27/23 1756  AMMONIA <13    ABG    Component Value Date/Time   PHART 7.44 05/15/2023 2309   PCO2ART 31 (L) 05/15/2023 2309   PO2ART 104 05/15/2023 2309   HCO3 20.6 11/26/2023 2015   TCO2 17 (L) 11/25/2023 2132    ACIDBASEDEF 5.4 (H) 11/26/2023 2015   O2SAT 48.5 11/26/2023 2015     Coagulation Profile: Recent Labs  Lab 11/27/23 1756  INR 1.9*    Cardiac Enzymes: No results for input(s): CKTOTAL, CKMB, CKMBINDEX, TROPONINI in the last 168 hours.  HbA1C: Hgb A1c MFr Bld  Date/Time Value Ref Range Status  07/18/2023 02:27 AM 5.2 4.8 - 5.6 % Final    Comment:    (NOTE) Diagnosis of Diabetes The following HbA1c ranges recommended by the American Diabetes Association (ADA) may be used as an aid in the diagnosis of diabetes  mellitus.  Hemoglobin             Suggested A1C NGSP%              Diagnosis  <5.7                   Non Diabetic  5.7-6.4                Pre-Diabetic  >6.4                   Diabetic  <7.0                   Glycemic control for                       adults with diabetes.    01/02/2023 04:45 PM 7.0 (H) 4.8 - 5.6 % Final    Comment:    (NOTE) Pre diabetes:          5.7%-6.4%  Diabetes:              >6.4%  Glycemic control for   <7.0% adults with diabetes     CBG: Recent Labs  Lab 11/27/23 2110 11/27/23 2331 2023-12-10 0210 December 10, 2023 0513 12/10/23 0722  GLUCAP 125* 122* 166* 347* 90    CRITICAL CARE Performed by: Ronnald FORBES Gave   Total critical care time: 40 minutes  Critical care time was exclusive of separately billable procedures and treating other patients. Critical care was necessary to treat or prevent imminent or life-threatening deterioration.  Critical care was time spent personally by me on the following activities: development of treatment plan with patient and/or surrogate as well as nursing, discussions with consultants, evaluation of patient's response to treatment, examination of patient, obtaining history from patient or surrogate, ordering and performing treatments and interventions, ordering and review of laboratory studies, ordering and review of radiographic studies, pulse oximetry and re-evaluation of patient's  condition.  Ronnald Gave MSN, AGACNP-BC [facility] Amion for pager Dec 10, 2023, 8:57 AM

## 2023-12-20 NOTE — Death Summary Note (Signed)
 DEATH SUMMARY   Patient Details  Name: Harold [name] MRN: [id number] DOB: [birth year]  Admission/Discharge Information   Admit Date:  12-14-23  Date of Death: Date of Death: 2023-12-17  Time of Death: Time of Death: 0948  Length of Stay: 3  Referring Physician: Patient, No Pcp Per   Reason(s) for Hospitalization   Weakness   Diagnoses  Preliminary cause of death: septic shock due to osteomyelitis  Secondary Diagnoses (including complications and co-morbidities):  Principal Problem:   Acute on chronic anemia Active Problems:   Diabetes mellitus type 2 with neurological manifestations (HCC)   CAD, LAD/CFX DES 04/28/12- staged RCA DES 04/29/12 after pt declined CABG, cath 05/14/13 stable CAD medical therapy   OSA on CPAP   COPD (chronic obstructive pulmonary disease) (HCC)   Chronic heart failure with preserved ejection fraction (HFpEF) (HCC)   Acute renal failure superimposed on stage 3b chronic kidney disease (HCC)   Paroxysmal atrial fibrillation (HCC)   Hypothyroidism   PAD (peripheral artery disease)   Osteomyelitis (HCC)   Severe protein-calorie malnutrition   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 year old male who presented to the ED 14-Dec-2023 with CC of generalized weakness. In the ED he required emergent blood transfusion for hgb 6 with associated soft blood pressures, as well as IVF and  abx for his lower extremity wound and known osteomyelitis. He was admitted to [facility] 10/8 and received ongoing care for AKI on CKD, AoC anemia with concern for GIB for which GI was consulted, hypotension 2/2 hypovolemia and sepsis in setting of his known chronic infections. 10/8-10/9 night, the patient began to deteriorate, with progressive altered mental status and shock. He was transferred to the ICU in this setting. On 10/9 the patient had escalating pressor requirements and a PICC was placed. Ortho was engaged regarding BKA discussions. Antibiotics and fluids were continued. 10/9-10/10 night he decompensated significantly, with worsening shock and multisystem organ failure. Goals of care were again discussed with the patient's husband 17-Dec-2023 and ultimately a decision was reached to transition to comfort care.   The patient died peacefully 2023-12-17 at [phone], with his husband lovingly at the bedside.     Pertinent Labs and Studies  Significant Diagnostic Studies VAS US  UPPER EXTREMITY VENOUS DUPLEX Result Date: 12-17-23 UPPER VENOUS STUDY  Patient Name:  Harold [name]  Date of Exam:   11/27/2023 Medical Rec #: [id number]             Accession #:    [id number] Date of Birth: 1955-08-17             Patient Gender: M Patient Age:   68 years Exam Location:  [facility] Procedure:      VAS US  UPPER EXTREMITY VENOUS DUPLEX Referring Phys: REXENE BLUSH --------------------------------------------------------------------------------  Indications: Pain, and recent PICC line, anemia, Afib- on Eliquis . Limitations: Poor ultrasound/tissue interface and bandages. Comparison Study: No prior exam. Performing Technologist: Edilia Elden Appl  Examination Guidelines:  A complete evaluation includes B-mode imaging, spectral Doppler, color Doppler, and power Doppler as needed of all accessible portions of each vessel. Bilateral testing is considered an integral part of a complete examination. Limited  examinations for reoccurring indications may be performed as noted.  Right Findings: +----------+------------+---------+-----------+----------+-------+ RIGHT     CompressiblePhasicitySpontaneousPropertiesSummary +----------+------------+---------+-----------+----------+-------+ IJV           Full       Yes       Yes                      +----------+------------+---------+-----------+----------+-------+ Subclavian    Full       Yes       Yes                      +----------+------------+---------+-----------+----------+-------+  Left Findings: +----------+------------+---------+-----------+----------+-------------------+ LEFT      CompressiblePhasicitySpontaneousProperties      Summary       +----------+------------+---------+-----------+----------+-------------------+ IJV           Full       Yes       Yes                                  +----------+------------+---------+-----------+----------+-------------------+ Subclavian    Full       Yes       Yes                                  +----------+------------+---------+-----------+----------+-------------------+ Axillary      Full       Yes       Yes                                  +----------+------------+---------+-----------+----------+-------------------+ Brachial      Full       Yes       Yes                                  +----------+------------+---------+-----------+----------+-------------------+ Radial                                                Not visualized    +----------+------------+---------+-----------+----------+-------------------+ Ulnar                                                Age Indeterminate  +----------+------------+---------+-----------+----------+-------------------+ Cephalic      Full       Yes       Yes              Intimal thickening. +----------+------------+---------+-----------+----------+-------------------+ Basilic       Full       Yes        Yes                                  +----------+------------+---------+-----------+----------+-------------------+ Extremely limited exam due to patient's condition, edema and  bandages. Left radial vein, ulnar vein, cephalic vein and basilic vein at the forearm were not visualized due to wrap around bandages from the distal upper arm to the wrist. No thrombus noted in visualized segments.  Summary:  Right: No evidence of thrombosis in the subclavian.  Left: No evidence of deep vein thrombosis in the upper extremity. No evidence of superficial vein thrombosis in the upper extremity.  *See table(s) above for measurements and observations.  Diagnosing physician: Norman Serve Electronically signed by Norman Serve on 06-Dec-2023 at 8:58:23 AM.    Final    US  EKG SITE RITE Result Date: 11/27/2023 If Site Rite image not attached, placement could not be confirmed due to current cardiac rhythm.  CT Head Wo Contrast Result Date: 11/25/2023 EXAM: CT HEAD WITHOUT CONTRAST 11/25/2023 10:05:33 PM TECHNIQUE: CT of the head was performed without the administration of intravenous contrast. Automated exposure control, iterative reconstruction, and/or weight based adjustment of the mA/kV was utilized to reduce the radiation dose to as low as reasonably achievable. COMPARISON: None available. CLINICAL HISTORY: Mental status change, unknown cause. FINDINGS: BRAIN AND VENTRICLES: Moderate global parenchymal volume loss is slightly advanced in relation to the patient's stated age. Mild periventricular white matter changes are present, likely reflecting small vessel ischemia. Ventricular size is normal and commensurate with a degree of parenchymal volume loss. No acute hemorrhage. No evidence of acute infarct. No hydrocephalus. No extra-axial collection. No mass effect or midline shift. ORBITS: No acute abnormality. SINUSES: No acute abnormality. SOFT TISSUES AND SKULL: No acute soft tissue abnormality. No skull fracture.  IMPRESSION: 1. No acute intracranial abnormality. 2. Moderate global parenchymal volume loss, slightly advanced for age. Electronically signed by: Dorethia Molt MD 11/25/2023 10:10 PM EDT RP Workstation: [id number]   DG Chest Port 1 View Result Date: 11/25/2023 EXAM: 1 VIEW XRAY OF THE CHEST 11/25/2023 09:28:00 PM COMPARISON: None available. CLINICAL HISTORY: weakness. Pt bib [facility] coming from home with CC of generalized weakness. Last week, patient has had increase in AMS. Pt A\T\Ox3 but A\T\Ox4 at baseline. Blood work was done last week with hemoglobin of 5 but patient did not get treatment for this. Pt has swelling to ; left arm. GCS 14. weakness. Pt bib [facility] coming from home with CC of generalized weakness. Last week, patient has had increase in AMS. Pt A\T\Ox3 but A\T\Ox4 at baseline. Blood work was done last week with hemoglobin of 5 but patient did not get treatment for this. Pt has swelling to ; left arm. GCS 14. FINDINGS: LUNGS AND PLEURA: Pulmonary hyperinflation. No pulmonary opacity. No pleural effusion. No pneumothorax. HEART AND MEDIASTINUM: Cardiac size within normal limits. BONES AND SOFT TISSUES: No acute bone abnormality. Bony vascularity is normal. IMPRESSION: 1. No acute process. 2. Pulmonary hyperinflation. Electronically signed by: Dorethia Molt MD 11/25/2023 09:32 PM EDT RP Workstation: [id number]    Microbiology Recent Results (from the past 240 hours)  Resp panel by RT-PCR (RSV, Flu A&B, Covid) Anterior Nasal Swab     Status: None   Collection Time: 11/25/23  9:13 PM   Specimen: Anterior Nasal Swab  Result Value Ref Range Status   SARS Coronavirus 2 by RT PCR NEGATIVE NEGATIVE Final   Influenza A by PCR NEGATIVE NEGATIVE Final   Influenza B by PCR NEGATIVE NEGATIVE Final    Comment: (NOTE) The Xpert Xpress SARS-CoV-2/FLU/RSV plus assay is intended as an aid in the diagnosis of influenza from Nasopharyngeal swab specimens and should not be used as a sole basis for  treatment.  Nasal washings and aspirates are unacceptable for Xpert Xpress SARS-CoV-2/FLU/RSV testing.  Fact Sheet for Patients: BloggerCourse.com  Fact Sheet for Healthcare Providers: SeriousBroker.it  This test is not yet approved or cleared by the [place]  FDA and has been authorized for detection and/or diagnosis of SARS-CoV-2 by FDA under an Emergency Use Authorization (EUA). This EUA will remain in effect (meaning this test can be used) for the duration of the COVID-19 declaration under Section 564(b)(1) of the Act, 21 U.S.C. section 360bbb-3(b)(1), unless the authorization is terminated or revoked.     Resp Syncytial Virus by PCR NEGATIVE NEGATIVE Final    Comment: (NOTE) Fact Sheet for Patients: BloggerCourse.com  Fact Sheet for Healthcare Providers: SeriousBroker.it  This test is not yet approved or cleared by the [place]  FDA and has been authorized for detection and/or diagnosis of SARS-CoV-2 by FDA under an Emergency Use Authorization (EUA). This EUA will remain in effect (meaning this test can be used) for the duration of the COVID-19 declaration under Section 564(b)(1) of the Act, 21 U.S.C. section 360bbb-3(b)(1), unless the authorization is terminated or revoked.  Performed at [facility], [address], [place], [place] [address]   Blood Culture (routine x 2)     Status: None (Preliminary result)   Collection Time: 11/25/23  9:13 PM   Specimen: BLOOD  Result Value Ref Range Status   Specimen Description BLOOD SITE NOT SPECIFIED  Final   Special Requests   Final    BOTTLES DRAWN AEROBIC AND ANAEROBIC Blood Culture results may not be optimal due to an inadequate volume of blood received in culture bottles   Culture   Final    NO GROWTH 3 DAYS Performed at [facility], [address] [place], [place] [address]    Report Status PENDING   Incomplete  Blood Culture (routine x 2)     Status: None (Preliminary result)   Collection Time: 11/25/23  9:25 PM   Specimen: BLOOD  Result Value Ref Range Status   Specimen Description BLOOD SITE NOT SPECIFIED  Final   Special Requests   Final    BOTTLES DRAWN AEROBIC AND ANAEROBIC Blood Culture results may not be optimal due to an inadequate volume of blood received in culture bottles   Culture   Final    NO GROWTH 3 DAYS Performed at [facility], [address] [place], [place] [address]    Report Status PENDING  Incomplete  Urine Culture     Status: Abnormal   Collection Time: 11/25/23 10:29 PM   Specimen: Urine, Random  Result Value Ref Range Status   Specimen Description URINE, RANDOM  Final   Special Requests   Final    NONE Reflexed from [id number] Performed at [facility], [address], [place], [place] [address]    Culture >=100,000 COLONIES/mL YEAST (A)  Final   Report Status 11/27/2023 FINAL  Final  MRSA Next Gen by PCR, Nasal     Status: None   Collection Time: 11/26/23 11:19 PM   Specimen: Nasal Mucosa; Nasal Swab  Result Value Ref Range Status   MRSA by PCR Next Gen NOT DETECTED NOT DETECTED Final    Comment: (NOTE) The GeneXpert MRSA Assay (FDA approved for NASAL specimens only), is one component of a comprehensive MRSA colonization surveillance program. It is not intended to diagnose MRSA infection nor to guide or monitor treatment for MRSA infections. Test performance is not FDA approved in patients less than  35 years old. Performed at [facility], [address], [place], [place] [address]     Lab Basic Metabolic Panel: Recent Labs  Lab 11/25/23 2057 11/25/23 2119 11/25/23 2132 11/26/23 0547 11/26/23 0548 11/26/23 2015 11/27/23 1756 2023-12-10 0514  NA 137  --  139  --  135 137 135  134* 128*  K 3.3*  --  3.3*  --  3.1* 3.0* 3.4*  3.5 3.5  CL 105  --  106  --  104 105 103  103 97*  CO2 17*  --   --   --  14* 19* 15*   14* 11*  GLUCOSE 121*  --  110*  --  146* 146* 224*  223* 381*  BUN 40*  --  33*  --  43* 45* 48*  49* 49*  CREATININE 2.56*  --  2.60*  --  2.67* 2.80* 3.34*  3.33* 3.44*  CALCIUM  7.5*  --   --   --  7.3* 7.5* 7.6*  7.6* 7.1*  MG  --  1.5*  --   --  1.4* 1.8 1.8 1.9  PHOS  --   --   --  3.2  --  3.4  --   --    Liver Function Tests: Recent Labs  Lab 11/25/23 2057 11/27/23 1756  AST 16 25  ALT 8 8  ALKPHOS 83 67  BILITOT 1.3* 1.2  PROT 4.3* 4.2*  ALBUMIN  1.7* 2.0*   No results for input(s): LIPASE, AMYLASE in the last 168 hours. Recent Labs  Lab 11/27/23 1756  AMMONIA <13   CBC: Recent Labs  Lab 11/25/23 2057 11/25/23 2132 11/26/23 0548 11/26/23 2015 11/27/23 1756 12/10/2023 0514  WBC 8.0  --  7.6 7.5 16.9* 20.9*  NEUTROABS  --   --  6.6  --   --   --   HGB 6.3* 6.1* 8.8* 8.9* 9.4* 9.3*  HCT 18.7* 18.0* 26.5* 26.1* 27.6* 27.7*  MCV 88.6  --  88.9 86.4 86.5 87.7  PLT 46*  --  38* 44* 60*  62* 57*   Cardiac Enzymes: No results for input(s): CKTOTAL, CKMB, CKMBINDEX, TROPONINI in the last 168 hours. Sepsis Labs: Recent Labs  Lab 11/26/23 0548 11/26/23 2015 11/27/23 1756 December 10, 2023 0046 12-10-23 0514  WBC 7.6 7.5 16.9*  --  20.9*  LATICACIDVEN  --  2.3* 5.2* 6.4* 7.6*    Procedures/Operations  11/27/2023 PICC    Ronnald E Tassie Pollett 12/10/23, 11:24 AM

## 2023-12-20 DEATH — deceased

## 2024-01-01 ENCOUNTER — Telehealth: Payer: Self-pay | Admitting: Internal Medicine

## 2024-02-03 ENCOUNTER — Ambulatory Visit: Admitting: Cardiovascular Disease
